# Patient Record
Sex: Female | Born: 1970
Health system: Southern US, Community
[De-identification: ages and names within clinical notes are randomized; demographics above are authoritative.]

## PROBLEM LIST (undated history)

## (undated) DIAGNOSIS — E119 Type 2 diabetes mellitus without complications: Secondary | ICD-10-CM

## (undated) DIAGNOSIS — I739 Peripheral vascular disease, unspecified: Secondary | ICD-10-CM

## (undated) DIAGNOSIS — I679 Cerebrovascular disease, unspecified: Secondary | ICD-10-CM

## (undated) DIAGNOSIS — I779 Disorder of arteries and arterioles, unspecified: Secondary | ICD-10-CM

## (undated) DIAGNOSIS — I5189 Other ill-defined heart diseases: Secondary | ICD-10-CM

## (undated) DIAGNOSIS — F32A Depression, unspecified: Secondary | ICD-10-CM

## (undated) DIAGNOSIS — F4310 Post-traumatic stress disorder, unspecified: Secondary | ICD-10-CM

## (undated) DIAGNOSIS — I10 Essential (primary) hypertension: Secondary | ICD-10-CM

## (undated) DIAGNOSIS — E785 Hyperlipidemia, unspecified: Secondary | ICD-10-CM

## (undated) DIAGNOSIS — I2510 Atherosclerotic heart disease of native coronary artery without angina pectoris: Secondary | ICD-10-CM

## (undated) DIAGNOSIS — Z720 Tobacco use: Secondary | ICD-10-CM

## (undated) DIAGNOSIS — M26629 Arthralgia of temporomandibular joint, unspecified side: Secondary | ICD-10-CM

## (undated) DIAGNOSIS — D689 Coagulation defect, unspecified: Secondary | ICD-10-CM

## (undated) DIAGNOSIS — E669 Obesity, unspecified: Secondary | ICD-10-CM

## (undated) DIAGNOSIS — F329 Major depressive disorder, single episode, unspecified: Secondary | ICD-10-CM

## (undated) DIAGNOSIS — R0989 Other specified symptoms and signs involving the circulatory and respiratory systems: Secondary | ICD-10-CM

## (undated) DIAGNOSIS — K760 Fatty (change of) liver, not elsewhere classified: Secondary | ICD-10-CM

## (undated) DIAGNOSIS — I639 Cerebral infarction, unspecified: Secondary | ICD-10-CM

## (undated) DIAGNOSIS — Z9289 Personal history of other medical treatment: Secondary | ICD-10-CM

## (undated) HISTORY — DX: Major depressive disorder, single episode, unspecified: F32.9

## (undated) HISTORY — DX: Post-traumatic stress disorder, unspecified: F43.10

## (undated) HISTORY — DX: Arthralgia of temporomandibular joint, unspecified side: M26.629

## (undated) HISTORY — DX: Coagulation defect, unspecified: D68.9

## (undated) HISTORY — DX: Depression, unspecified: F32.A

## (undated) HISTORY — DX: Fatty (change of) liver, not elsewhere classified: K76.0

## (undated) HISTORY — PX: CHOLECYSTECTOMY: SHX55

## (undated) HISTORY — DX: Cerebral infarction, unspecified: I63.9

## (undated) HISTORY — DX: Other specified symptoms and signs involving the circulatory and respiratory systems: R09.89

## (undated) HISTORY — DX: Cerebrovascular disease, unspecified: I67.9

## (undated) HISTORY — PX: TONSILLECTOMY: SUR1361

## (undated) HISTORY — DX: Type 2 diabetes mellitus without complications: E11.9

---

## 2004-07-04 ENCOUNTER — Emergency Department (HOSPITAL_COMMUNITY): Admission: EM | Admit: 2004-07-04 | Discharge: 2004-07-05 | Payer: Self-pay | Admitting: Emergency Medicine

## 2009-05-02 ENCOUNTER — Encounter: Payer: Self-pay | Admitting: Family Medicine

## 2009-05-02 ENCOUNTER — Ambulatory Visit (HOSPITAL_COMMUNITY): Admission: RE | Admit: 2009-05-02 | Discharge: 2009-05-02 | Payer: Self-pay | Admitting: Family Medicine

## 2009-05-28 ENCOUNTER — Emergency Department (HOSPITAL_COMMUNITY): Admission: EM | Admit: 2009-05-28 | Discharge: 2009-05-29 | Payer: Self-pay | Admitting: Emergency Medicine

## 2009-11-14 ENCOUNTER — Ambulatory Visit: Payer: Self-pay | Admitting: Diagnostic Radiology

## 2009-11-14 ENCOUNTER — Emergency Department (HOSPITAL_BASED_OUTPATIENT_CLINIC_OR_DEPARTMENT_OTHER): Admission: EM | Admit: 2009-11-14 | Discharge: 2009-11-14 | Payer: Self-pay | Admitting: Emergency Medicine

## 2010-07-26 ENCOUNTER — Ambulatory Visit: Payer: Self-pay | Admitting: Family Medicine

## 2010-07-26 DIAGNOSIS — I10 Essential (primary) hypertension: Secondary | ICD-10-CM | POA: Insufficient documentation

## 2010-07-26 DIAGNOSIS — F4323 Adjustment disorder with mixed anxiety and depressed mood: Secondary | ICD-10-CM | POA: Insufficient documentation

## 2010-07-26 DIAGNOSIS — E785 Hyperlipidemia, unspecified: Secondary | ICD-10-CM | POA: Insufficient documentation

## 2010-07-27 ENCOUNTER — Encounter: Payer: Self-pay | Admitting: Family Medicine

## 2010-07-27 LAB — CONVERTED CEMR LAB
BUN: 9 mg/dL (ref 6–23)
Cholesterol: 248 mg/dL — ABNORMAL HIGH (ref 0–200)
Creatinine, Ser: 0.7 mg/dL (ref 0.4–1.2)
Direct LDL: 176.1 mg/dL
GFR calc non Af Amer: 109.22 mL/min (ref >60)
Potassium: 4.5 meq/L (ref 3.5–5.1)
Triglycerides: 369 mg/dL — ABNORMAL HIGH (ref 0.0–149.0)

## 2010-09-30 ENCOUNTER — Ambulatory Visit: Payer: Self-pay | Admitting: Internal Medicine

## 2010-09-30 DIAGNOSIS — M26629 Arthralgia of temporomandibular joint, unspecified side: Secondary | ICD-10-CM | POA: Insufficient documentation

## 2010-09-30 DIAGNOSIS — H6500 Acute serous otitis media, unspecified ear: Secondary | ICD-10-CM | POA: Insufficient documentation

## 2011-01-14 ENCOUNTER — Encounter: Payer: Self-pay | Admitting: Family Medicine

## 2011-01-15 ENCOUNTER — Encounter: Payer: Self-pay | Admitting: Family Medicine

## 2011-01-23 NOTE — Letter (Signed)
Summary: Out of Work  [facility]  [address] [place], [place] [address]   Phone: [phone]  Fax: [phone]    July 26, 2010   Employee:  DAJIAH KOOI    To Whom It May Concern:   For Medical reasons, please excuse the above named employee from work for the following dates:  Start:   07/27/2010  End:   08/03/2010  If you need additional information, please feel free to contact our office.         Sincerely,    Ruthe Mannan MD

## 2011-01-23 NOTE — Letter (Signed)
Summary: Behavioral Health Clinician Statement/[employer]  Behavioral Health Clinician Statement/[employer]   Imported By: Maryln Gottron 08/04/2010 12:37:02  _____________________________________________________________________  External Attachment:    Type:   Image     Comment:   External Document

## 2011-01-23 NOTE — Assessment & Plan Note (Signed)
Summary: ? EARACHE/JBB   Vital Signs:  Patient Profile:   40 Years Old Female CC:      left jaw pain radiating to left ear Height:     67 inches Weight:      230 pounds Temp:     98.1 degrees F oral Pulse rate:   84 / minute Pulse rhythm:   regular Resp:     20 per minute BP sitting:   158 / 103  (right arm) Cuff size:   large  Pt. in pain?   no  Vitals Entered By: Providence Crosby LPN (September 30, 2010 2:18 PM)                   Updated Prior Medication List: HYDROCHLOROTHIAZIDE 25 MG TABS (HYDROCHLOROTHIAZIDE) take one tablet by mouth daily ENALAPRIL MALEATE 20 MG TABS (ENALAPRIL MALEATE) take one tablet by mouth daily WELLBUTRIN SR 150 MG XR12H-TAB (BUPROPION HCL) 1 tab by mouth every morning. SIMVASTATIN 10 MG TABS (SIMVASTATIN) 1 by mouth at bedtime ALEVE 220 MG TABS (NAPROXEN SODIUM) prn  Current Allergies (reviewed today): No known allergies History of Present Illness Reason for visit: left jaw pain radiating to ear Chief Complaint: left jaw pain radiating to left ear History of Present Illness: started 2 days ago cannot tell if it is her ear are tooth uses left ear at work with head phone. Sharp, near tmj, worse with vigorous chewing. History of similar bilat. Also can't pop ear on left.  Current Problems: TMJ PAIN (ICD-524.62) ACUTE SEROUS OTITIS MEDIA (ICD-381.01) ADJUSTMENT DISORDER WITH MIXED FEATURES (ICD-309.28) HYPERLIPIDEMIA (ICD-272.4) HYPERTENSION (ICD-401.9) ESSENTIAL HYPERTENSION, BENIGN (ICD-401.1)   Current Meds HYDROCHLOROTHIAZIDE 25 MG TABS (HYDROCHLOROTHIAZIDE) take one tablet by mouth daily ENALAPRIL MALEATE 20 MG TABS (ENALAPRIL MALEATE) take one tablet by mouth daily WELLBUTRIN SR 150 MG XR12H-TAB (BUPROPION HCL) 1 tab by mouth every morning. SIMVASTATIN 10 MG TABS (SIMVASTATIN) 1 by mouth at bedtime ALEVE 220 MG TABS (NAPROXEN SODIUM) prn  REVIEW OF SYSTEMS Constitutional Symptoms      Denies fever, chills, night sweats, weight loss,  weight gain, and fatigue.  Eyes       Denies change in vision, eye pain, eye discharge, glasses, contact lenses, and eye surgery. Ear/Nose/Throat/Mouth       Complains of ear pain and tooth pain or bleeding.      Denies hearing loss/aids, change in hearing, ear discharge, dizziness, frequent runny nose, frequent nose bleeds, sinus problems, sore throat, and hoarseness.      Comments: left ear// and tooth Respiratory       Denies dry cough, productive cough, wheezing, shortness of breath, asthma, bronchitis, and emphysema/COPD.  Cardiovascular       Denies murmurs, chest pain, and tires easily with exhertion.    Gastrointestinal       Denies stomach pain, nausea/vomiting, diarrhea, constipation, blood in bowel movements, and indigestion. Genitourniary       Denies painful urination, kidney stones, and loss of urinary control. Neurological       Denies paralysis, seizures, and fainting/blackouts. Musculoskeletal       Denies muscle pain, joint pain, joint stiffness, decreased range of motion, redness, swelling, muscle weakness, and gout.  Skin       Denies bruising, unusual mles/lumps or sores, and hair/skin or nail changes.  Psych       Denies mood changes, temper/anger issues, anxiety/stress, speech problems, depression, and sleep problems.  Past History:  Past Medical History: Last updated: 07/26/2010 Hypertension Hyperlipidemia  Past Surgical History: Last updated: 07/26/2010 Cholecystectomy Tonsillectomy Caesarean section  Social History: Last updated: 07/26/2010 Married Current Smoker Alcohol use-no Drug use-no Physical Exam General appearance: well developed, well nourished, no acute distress Head: normocephalic, atraumatic Eyes: conjunctivae and lids normal Pupils: equal, round, reactive to light Ears: fluid noted without inflammaton left TM Nasal: mucosa pink, nonedematous, no septal deviation, turbinates normal Oral/Pharynx: tongue normal, posterior pharynx  without erythema or exudate Neck: neck supple,  trachea midline, no masses Thyroid: no nodules, masses, tenderness, or enlargement Neurological: grossly intact and non-focal Skin: no obvious rashes or lesions MSE: oriented to time, place, and person teeth not tender but cheek near tmj tender Assessment New Problems: TMJ PAIN (ICD-524.62) ACUTE SEROUS OTITIS MEDIA (ICD-381.01)   Patient Education: Patient and/or caregiver instructed in the following: Tylenol prn, quit smoking. aleve 1-2 two times a day pc as needed coricidin for hypertensives as needed oxymetalazone nasal spray two times a day for 3 days max recheck bp daily and follow up with PCP if remains elevated  Plan Follow Up: Follow up in 1-2 days if no improvement    Patient Instructions: 1)  declined specific diagnosis sheets. 2)  The patient was instructed to seek medical follow-up for worsening or new symptoms and if needed outside of our regular hours, seek attention at an Emergency Dept. It was also explained that the [facility] is not intended as a primary care facility and that the patient is always better served by the continuity of care of the ongoing relationship with a dedicated primary care MD. The patient should inform their primary MD of all conditions and treatments provided here.

## 2011-01-23 NOTE — Letter (Signed)
Summary: Records from [facility]  2010  Records from [facility]  2010   Imported By: Maryln Gottron 08/08/2010 10:57:27  _____________________________________________________________________  External Attachment:    Type:   Image     Comment:   External Document

## 2011-01-23 NOTE — Assessment & Plan Note (Signed)
Summary: NEW PT TO EST/CLE   Vital Signs:  Patient profile:   40 year old female Height:      67 inches Weight:      225.38 pounds BMI:     35.43 Temp:     98.8 degrees F oral Pulse rate:   72 / minute Pulse rhythm:   regular BP sitting:   130 / 90  (right arm) Cuff size:   large  Vitals Entered By: Linde Gillis CMA Duncan Dull) (July 26, 2010 10:10 AM) CC: new patient   History of Present Illness: 40 yo here to establish care.  1.  Adjustment disorder- anxiety and depression.  Husband was injured at work, daughter is in kidney failure and mom died two years ago.  Tearful at work, difficultly concentrating.  Difficulty getting out of bed in the morning, lack of interest in things she used to do. Never been treated for anxiety or depression in past.   Seeing a counsellor for the first time today, referred by a friend- [facility] No SI or HI. Feels she needs to take FMLA so she does not get fired.  2.  HTN- has been on HCTZ 25 mg/Enalapril 20 mg daily for years.  No CP, SOB, blurred vision or LE edema.  3.  HLD- was told last year that she has HLD, not on medication.  Well woman- due for pap, FLP, BMET, tetanus.  Preventive Screening-Counseling & Management  Alcohol-Tobacco     Smoking Status: current      Drug Use:  no.    Current Medications (verified): 1)  Hydrochlorothiazide 25 Mg Tabs (Hydrochlorothiazide) .... Take One Tablet By Mouth Daily 2)  Enalapril Maleate 20 Mg Tabs (Enalapril Maleate) .... Take One Tablet By Mouth Daily 3)  Wellbutrin Sr 150 Mg Xr12h-Tab (Bupropion Hcl) .Marland Kitchen.. 1 Tab By Mouth Every Morning.  Allergies (verified): No Known Drug Allergies  Past History:  Family History: Last updated: 07/26/2010 Mom - DM Dad- DM, alcoholism  Social History: Last updated: 07/26/2010 Married Current Smoker Alcohol use-no Drug use-no  Risk Factors: Smoking Status: current (07/26/2010)  Past Medical  History: Hypertension Hyperlipidemia  Past Surgical History: Cholecystectomy Tonsillectomy Caesarean section  Family History: Mom - DM Dad- DM, alcoholism  Social History: Married Current Smoker Alcohol use-no Drug use-no Smoking Status:  current Drug Use:  no  Review of Systems      See HPI General:  Denies malaise. Eyes:  Denies blurring. ENT:  Denies difficulty swallowing. CV:  Denies chest pain or discomfort and shortness of breath with exertion. Resp:  Denies shortness of breath. GI:  Denies abdominal pain, bloody stools, and change in bowel habits. GU:  Denies abnormal vaginal bleeding, discharge, and dysuria. MS:  Denies joint pain, joint redness, and joint swelling. Derm:  Denies rash. Neuro:  Denies headaches. Psych:  Complains of anxiety, depression, easily tearful, and irritability; denies easily angered, mental problems, panic attacks, sense of great danger, suicidal thoughts/plans, thoughts of violence, unusual visions or sounds, and thoughts /plans of harming others. Endo:  Denies cold intolerance and heat intolerance. Heme:  Denies abnormal bruising and bleeding.  Physical Exam  General:  alert, well-developed, and overweight-appearing.   Head:  normocephalic and atraumatic.   Eyes:  vision grossly intact, pupils equal, and pupils round.   Ears:  R ear normal and L ear normal.   Nose:  no external deformity.   Mouth:  good dentition.   Lungs:  Normal respiratory effort, chest expands symmetrically. Lungs  are clear to auscultation, no crackles or wheezes. Heart:  Normal rate and regular rhythm. S1 and S2 normal without gallop, murmur, click, rub or other extra sounds. Abdomen:  Bowel sounds positive,abdomen soft and non-tender without masses, organomegaly or hernias noted. Msk:  normal ROM, no joint tenderness, no joint swelling, and no joint warmth.   Extremities:  no edema Neurologic:  alert & oriented X3 and gait normal.   Skin:  Intact without  suspicious lesions or rashes Psych:  Cognition and judgment appear intact. Alert and cooperative with normal attention span and concentration. No apparent delusions, illusions, hallucinations   Impression & Recommendations:  Problem # 1:  ADJUSTMENT DISORDER WITH MIXED FEATURES (ICD-309.28) Assessment New Time spent with patient 45 minutes, more than 50% of this time was spent counseling patient on axiety/depression. Continue therapy, will start Wellbutrin 150 mg daily. Pt to follow up in one month. Given note to excuse her from work this week and agreed to fill out FMLA paperwork for her for 8 week duration (until antidepressants can be adjusted correctly).  Pt in agreement with plan.  Problem # 2:  HYPERTENSION (ICD-401.9) Assessment: Unchanged stable with current meds. Check BMET. Her updated medication list for this problem includes:    Hydrochlorothiazide 25 Mg Tabs (Hydrochlorothiazide) .Marland Kitchen... Take one tablet by mouth daily    Enalapril Maleate 20 Mg Tabs (Enalapril maleate) .Marland Kitchen... Take one tablet by mouth daily  Problem # 3:  HYPERLIPIDEMIA (ICD-272.4) Assessment: Unchanged recheck FLP today.  Complete Medication List: 1)  Hydrochlorothiazide 25 Mg Tabs (Hydrochlorothiazide) .... Take one tablet by mouth daily 2)  Enalapril Maleate 20 Mg Tabs (Enalapril maleate) .... Take one tablet by mouth daily 3)  Wellbutrin Sr 150 Mg Xr12h-tab (Bupropion hcl) .Marland Kitchen.. 1 tab by mouth every morning.  Other Orders: TLB-Lipid Panel (80061-LIPID) Venipuncture (04540) TLB-BMP (Basic Metabolic Panel-BMET) (80048-METABOL)  Patient Instructions: 1)  Great to meet you and [name]. 2)  Please make an appointment for a complete physical/pap in one month. Prescriptions: WELLBUTRIN SR 150 MG XR12H-TAB (BUPROPION HCL) 1 tab by mouth every morning.  #60 x 0   Entered and Authorized by:   Ruthe Mannan MD   Signed by:   Ruthe Mannan MD on 07/26/2010   Method used:   Electronically to        [id number]  [address] (retail)       [address], [address]       [place], [place]  [address]       Ph: [phone]       Fax: [phone]   RxID:   [id number] ENALAPRIL MALEATE 20 MG TABS (ENALAPRIL MALEATE) take one tablet by mouth daily  #90 x 3   Entered and Authorized by:   Ruthe Mannan MD   Signed by:   Ruthe Mannan MD on 07/26/2010   Method used:   Electronically to        [facility] [address] (retail)       [address], [address]       [place], [place]  [address]       Ph: [phone]       Fax: [phone]   RxID:   [id number] HYDROCHLOROTHIAZIDE 25 MG TABS (HYDROCHLOROTHIAZIDE) take one tablet by mouth daily  #90 x 3   Entered and Authorized by:   Ruthe Mannan MD   Signed by:  Ruthe Mannan MD on 07/26/2010   Method used:   Electronically to        [facility] [address] (retail)       [address], [address]       [place], [place]  [address]       Ph: [phone]       Fax: [phone]   RxID:   [id number]   Current Allergies (reviewed today): No known allergies  Prevention & Chronic Care Immunizations   Influenza vaccine: Not documented    Tetanus booster: 07/26/2010: given   Tetanus booster due: 07/26/2020    Pneumococcal vaccine: Not documented  Other Screening   Pap smear: Not documented   Smoking status: current  (07/26/2010)  Lipids   Total Cholesterol: Not documented   Lipid panel action/deferral: Lipid Panel ordered   LDL: Not documented   LDL Direct: Not documented   HDL: Not documented   Triglycerides: Not documented    SGOT (AST): Not documented   SGPT (ALT): Not documented   Alkaline phosphatase: Not documented   Total bilirubin: Not documented  Hypertension   Last Blood Pressure: 130 / 90  (07/26/2010)   Serum creatinine: Not documented   Serum potassium Not documented  Self-Management Support :    Hypertension self-management support:  Not documented    Lipid self-management support: Not documented    Nursing Instructions: Give tetanus booster today   TD Result Date:  07/26/2010 TD Result:  given

## 2011-01-24 ENCOUNTER — Ambulatory Visit: Payer: Self-pay

## 2011-01-24 DIAGNOSIS — J069 Acute upper respiratory infection, unspecified: Secondary | ICD-10-CM

## 2011-01-24 DIAGNOSIS — I10 Essential (primary) hypertension: Secondary | ICD-10-CM

## 2011-02-01 ENCOUNTER — Encounter: Payer: Self-pay | Admitting: Internal Medicine

## 2011-02-08 NOTE — Progress Notes (Signed)
Summary: office visit 01/24/11  office visit 01/24/11   Imported By: Erskine Squibb Breitmeier 02/01/2011 10:57:41  _____________________________________________________________________  External Attachment:    Type:   Image     Comment:   External Document

## 2011-02-08 NOTE — Progress Notes (Signed)
Summary: office visit-01/24/11  office visit-01/24/11   Imported By: Rosine Beat 02/01/2011 10:56:50  _____________________________________________________________________  External Attachment:    Type:   Image     Comment:   External Document

## 2011-03-28 LAB — POCT CARDIAC MARKERS
Myoglobin, poc: 58.7 ng/mL (ref 12–200)
Troponin i, poc: <0.05 ng/mL (ref 0.00–0.09)

## 2011-03-28 LAB — DIFFERENTIAL
Basophils Absolute: 0.1 10*3/uL (ref 0.0–0.1)
Basophils Relative: 1 % (ref 0–1)
Lymphs Abs: 1.3 10*3/uL (ref 0.7–4.0)
Monocytes Absolute: 0.3 10*3/uL (ref 0.1–1.0)
Neutrophils Relative %: 81 % — ABNORMAL HIGH (ref 43–77)

## 2011-03-28 LAB — BASIC METABOLIC PANEL
CO2: 28 mEq/L (ref 19–32)
Creatinine, Ser: 0.7 mg/dL (ref 0.4–1.2)
Glucose, Bld: 100 mg/dL — ABNORMAL HIGH (ref 70–99)
Potassium: 4.1 mEq/L (ref 3.5–5.1)

## 2011-03-28 LAB — CBC
HCT: 41.8 % (ref 36.0–46.0)
Hemoglobin: 14.2 g/dL (ref 12.0–15.0)
MCHC: 34 g/dL (ref 30.0–36.0)
Platelets: 367 10*3/uL (ref 150–400)
WBC: 10 10*3/uL (ref 4.0–10.5)

## 2011-04-02 LAB — COMPREHENSIVE METABOLIC PANEL
ALT: 62 U/L — ABNORMAL HIGH (ref 0–35)
AST: 42 U/L — ABNORMAL HIGH (ref 0–37)
Albumin: 4.3 g/dL (ref 3.5–5.2)
Alkaline Phosphatase: 66 U/L (ref 39–117)
CO2: 30 mEq/L (ref 19–32)
Calcium: 10.5 mg/dL (ref 8.4–10.5)
Creatinine, Ser: 1.82 mg/dL — ABNORMAL HIGH (ref 0.4–1.2)
Total Protein: 7.9 g/dL (ref 6.0–8.3)

## 2011-04-02 LAB — CBC
HCT: 45.4 % (ref 36.0–46.0)
MCHC: 35 g/dL (ref 30.0–36.0)
MCHC: 35.2 g/dL (ref 30.0–36.0)
MCV: 88.3 fL (ref 78.0–100.0)
MCV: 89.3 fL (ref 78.0–100.0)
Platelets: 389 10*3/uL (ref 150–400)
Platelets: 402 10*3/uL — ABNORMAL HIGH (ref 150–400)
RBC: 5.14 MIL/uL — ABNORMAL HIGH (ref 3.87–5.11)
RBC: 5.19 MIL/uL — ABNORMAL HIGH (ref 3.87–5.11)

## 2011-04-02 LAB — URINE CULTURE: Colony Count: 50000

## 2011-04-02 LAB — PREGNANCY, URINE: Preg Test, Ur: NEGATIVE

## 2011-04-02 LAB — DIFFERENTIAL
Basophils Absolute: 0 10*3/uL (ref 0.0–0.1)
Basophils Relative: 0 % (ref 0–1)
Eosinophils Absolute: 0.1 10*3/uL (ref 0.0–0.7)
Eosinophils Relative: 1 % (ref 0–5)
Monocytes Relative: 2 % — ABNORMAL LOW (ref 3–12)
Neutro Abs: 12.8 10*3/uL — ABNORMAL HIGH (ref 1.7–7.7)
Neutro Abs: 15.7 10*3/uL — ABNORMAL HIGH (ref 1.7–7.7)
Neutrophils Relative %: 81 % — ABNORMAL HIGH (ref 43–77)
Neutrophils Relative %: 87 % — ABNORMAL HIGH (ref 43–77)

## 2011-04-02 LAB — URINALYSIS, ROUTINE W REFLEX MICROSCOPIC
Glucose, UA: NEGATIVE mg/dL
Hgb urine dipstick: NEGATIVE
Urobilinogen, UA: 0.2 mg/dL (ref 0.0–1.0)

## 2011-04-02 LAB — POCT I-STAT, CHEM 8
Calcium, Ion: 1.19 mmol/L (ref 1.12–1.32)
Chloride: 102 mEq/L (ref 96–112)
Creatinine, Ser: 1.4 mg/dL — ABNORMAL HIGH (ref 0.4–1.2)
Hemoglobin: 16.7 g/dL — ABNORMAL HIGH (ref 12.0–15.0)
Potassium: 3.9 mEq/L (ref 3.5–5.1)
TCO2: 24 mmol/L (ref 0–100)

## 2011-11-27 ENCOUNTER — Other Ambulatory Visit: Payer: Self-pay | Admitting: Family Medicine

## 2011-11-27 MED ORDER — VARENICLINE TARTRATE 0.5 MG X 11 & 1 MG X 42 PO MISC: ORAL | Status: AC

## 2012-03-17 ENCOUNTER — Emergency Department: Payer: Self-pay | Admitting: Emergency Medicine

## 2012-03-17 ENCOUNTER — Telehealth: Payer: Self-pay | Admitting: Family Medicine

## 2012-03-17 LAB — COMPREHENSIVE METABOLIC PANEL
Alkaline Phosphatase: 79 U/L (ref 50–136)
BUN: 11 mg/dL (ref 7–18)
Bilirubin,Total: 0.3 mg/dL (ref 0.2–1.0)
Calcium, Total: 9.9 mg/dL (ref 8.5–10.1)
Chloride: 100 mmol/L (ref 98–107)
EGFR (Non-African Amer.): >60
Glucose: 114 mg/dL — ABNORMAL HIGH (ref 65–99)
Osmolality: 278 (ref 275–301)
Potassium: 3.7 mmol/L (ref 3.5–5.1)
SGOT(AST): 77 U/L — ABNORMAL HIGH (ref 15–37)
Sodium: 139 mmol/L (ref 136–145)
Total Protein: 8.5 g/dL — ABNORMAL HIGH (ref 6.4–8.2)

## 2012-03-17 LAB — URINALYSIS, COMPLETE
Bacteria: NONE SEEN
Glucose,UR: NEGATIVE mg/dL (ref 0–75)
Leukocyte Esterase: NEGATIVE
Nitrite: NEGATIVE
Ph: 5 (ref 4.5–8.0)
Protein: NEGATIVE
RBC,UR: <1 /HPF (ref 0–5)
Specific Gravity: 1.012 (ref 1.003–1.030)
WBC UR: 1 /HPF (ref 0–5)

## 2012-03-17 LAB — CBC
HGB: 16 g/dL (ref 12.0–16.0)
MCH: 30.2 pg (ref 26.0–34.0)
Platelet: 410 10*3/uL (ref 150–440)
RDW: 13.4 % (ref 11.5–14.5)

## 2012-03-17 LAB — PREGNANCY, URINE: Pregnancy Test, Urine: NEGATIVE m[IU]/mL

## 2012-03-17 NOTE — Telephone Encounter (Signed)
[name]/Pt  refused 911 with CSR Pt/Erika [name] calling regarding rt sided pain behind eye, blurriness, and numbness on rt side of body for the last 2 days. PT's boss making her call. Emergent sxs Neurological Deficits: New numbness, weakness or paralysis involving face, arm or leg, especially on same side of body, occuring now or w/in the last 2 hrs. Advised pt to call 911. Pt to do so now.

## 2012-03-17 NOTE — Telephone Encounter (Signed)
[facility] Triage Call Report Triage Record Num: [id number] Operator: Baldomero Lamy Patient Name: Erika [name] Call Date & Time: 03/17/2012 2:59:31PM Patient Phone: [phone] PCP: Ruthe Mannan Patient Gender: Female PCP Fax : [phone] Patient DOB: 1971-03-22 Practice Name: [facility] Reason for Call: Tesha/Pt refused 911 with CSR Pt/Wendi calling regarding rt sided pain behind eye, blurriness, and numbness over the last 2 days. PT's boss making her cal. Emergent sxs Neurological Deficits: New numbness, weakness or paralysis involving face, arm or leg, especially on same side of body, occuring now or w/in the last 2 hrs. Advised pt to call 911. Pt to do so now. Protocol(s) Used: Neurological Deficits Recommended Outcome per Protocol: Activate EMS 911 Reason for Outcome: New numbness, weakness or paralysis involving face, arm or leg, especially on same side of body, occurring now or within last 2 hours Care Advice: ~ Protect the patient from falling or other harm. ~ Do not give the patient anything to eat or drink. ~ An adult should stay with the patient, preferably one trained in CPR. ~ IMMEDIATE ACTION Write down provider's name. List or place the following in a bag for transport with the patient: current prescription and/or nonprescription medications; alternative treatments, therapies and medications; and street drugs. ~ 03/17/2012 3:18:57PM Page 1 of 1 [email or web address]

## 2012-03-18 ENCOUNTER — Ambulatory Visit: Payer: Self-pay | Admitting: Family Medicine

## 2012-03-19 ENCOUNTER — Encounter: Payer: Self-pay | Admitting: Family Medicine

## 2012-03-20 ENCOUNTER — Ambulatory Visit (INDEPENDENT_AMBULATORY_CARE_PROVIDER_SITE_OTHER): Payer: Private Health Insurance - Indemnity | Admitting: Family Medicine

## 2012-03-20 ENCOUNTER — Encounter: Payer: Self-pay | Admitting: Family Medicine

## 2012-03-20 VITALS — BP 170/100 | HR 68 | Temp 98.0°F | Wt 245.0 lb

## 2012-03-20 DIAGNOSIS — G44009 Cluster headache syndrome, unspecified, not intractable: Secondary | ICD-10-CM | POA: Insufficient documentation

## 2012-03-20 DIAGNOSIS — I10 Essential (primary) hypertension: Secondary | ICD-10-CM

## 2012-03-20 MED ORDER — ALBUTEROL SULFATE HFA 108 (90 BASE) MCG/ACT IN AERS: 2.0000 | INHALATION_SPRAY | Freq: Four times a day (QID) | RESPIRATORY_TRACT | Status: DC | PRN

## 2012-03-20 MED ORDER — VERAPAMIL HCL ER 240 MG PO TBCR: 240.0000 mg | EXTENDED_RELEASE_TABLET | Freq: Every day | ORAL | Status: DC

## 2012-03-20 MED ORDER — HYDROCHLOROTHIAZIDE 12.5 MG PO TABS: 12.5000 mg | ORAL_TABLET | Freq: Every day | ORAL | Status: DC

## 2012-03-20 NOTE — Progress Notes (Signed)
Subjective:    Patient ID: Erika [name], female    DOB: [birth year], 41 y.o.   MRN: [id number]  HPI  41 yo here for ER follow up.  Notes reviewed. Presented to [facility] on 03/17/2012 for headache x 3 days.   HA intermittent, associated with left temple pain and tearing of eye. Sometimes associated with numbness in finger tips.  Felt it was a cluster headache, given Os, IM toradol, IM reglan and headache resolved.  Head CT negative.  HTN has been poorly controlled on Enalapril 20 mg and HCTZ 25 mg daily.  Having these intermittent headaches -8 to 10 per day but none as severe as when she went to ER.  Patient Active Problem List  Diagnoses  . HYPERLIPIDEMIA  . ADJUSTMENT DISORDER WITH MIXED FEATURES  . ACUTE SEROUS OTITIS MEDIA  . ESSENTIAL HYPERTENSION, BENIGN  . HYPERTENSION  . TMJ PAIN  . Cluster headache   Past Medical History  Diagnosis Date  . Acute serous otitis media   . Adjustment disorder with mixed anxiety and depressed mood   . Essential hypertension, benign   . Other and unspecified hyperlipidemia   . Unspecified essential hypertension   . Arthralgia of temporomandibular joint    Past Surgical History  Procedure Date  . Cholecystectomy   . Tonsillectomy   . Cesarean section    History  Substance Use Topics  . Smoking status: Current Everyday Smoker  . Smokeless tobacco: Not on file  . Alcohol Use: No   Family History  Problem Relation Age of Onset  . Diabetes Mother   . Diabetes Father   . Alcohol abuse Father    No Known Allergies Current Outpatient Prescriptions on File Prior to Visit  Medication Sig Dispense Refill  . naproxen sodium (ANAPROX) 220 MG tablet Take by mouth as needed      . simvastatin (ZOCOR) 10 MG tablet Take 10 mg by mouth at bedtime.       The PMH, PSH, Social History, Family History, Medications, and allergies have been reviewed in [facility], and have been updated if relevant.   Review of Systems No blurred vision. No  CP, No SOB. No UE or LE weakness.    Objective:   Physical Exam  BP 170/100  Pulse 68  Temp(Src) 98 F (36.7 C) (Oral)  Wt 245 lb (111.131 kg)  General:  Well-developed,well-nourished,in no acute distress; alert,appropriate and cooperative throughout examination Head:  normocephalic and atraumatic.   Eyes:  vision grossly intact, pupils equal, pupils round, and pupils reactive to light.   Ears:  R ear normal and L ear normal.   Nose:  no external deformity.   Lungs:  Normal respiratory effort, chest expands symmetrically. Lungs are clear to auscultation, no crackles or wheezes. Heart:  Normal rate and regular rhythm. S1 and S2 normal without gallop, murmur, click, rub or other extra sounds. Abdomen:  Bowel sounds positive,abdomen soft and non-tender without masses, organomegaly or hernias noted. Msk:  No deformity or scoliosis noted of thoracic or lumbar spine.   Extremities:  No clubbing, cyanosis, edema, or deformity noted with normal full range of motion of all joints.   Neurologic:  alert & oriented X3 and gait normal.   Skin:  Intact without suspicious lesions or rashes Psych:  Cognition and judgment appear intact. Alert and cooperative with normal attention span and concentration. No apparent delusions, illusions, hallucinations       Assessment & Plan:   1. Cluster headache  New- discussed cluster  headaches with pt and her husband.  Will start preventative therapy today- Verapamil 240 mg daily.  Pt to follow up in 2 weeks.  2. HYPERTENSION  Deteriorated. Starting verapamil 240 mg for Cluster headaches, d/c lisinopril to prevent hypotension. Continue HCTZ but at lower dose- 12.5 mg daily. Follow up in 2 weeks- may need to titrate up HCTZ. The patient indicates understanding of these issues and agrees with the plan.

## 2012-03-20 NOTE — Patient Instructions (Signed)
Stop the enalapril. Start Verapamil and continue HCTZ 12.5 mg daily. Follow up with me in 2 weeks.

## 2012-04-01 ENCOUNTER — Telehealth: Payer: Self-pay | Admitting: *Deleted

## 2012-04-01 NOTE — Telephone Encounter (Signed)
Pt has dropped off FMLA paperwork, this is on your desk. 

## 2012-04-02 NOTE — Telephone Encounter (Signed)
Completed, in my box

## 2012-04-03 ENCOUNTER — Ambulatory Visit (INDEPENDENT_AMBULATORY_CARE_PROVIDER_SITE_OTHER): Payer: Private Health Insurance - Indemnity | Admitting: Family Medicine

## 2012-04-03 ENCOUNTER — Encounter: Payer: Self-pay | Admitting: Family Medicine

## 2012-04-03 VITALS — BP 180/100 | HR 76 | Temp 98.1°F | Wt 246.0 lb

## 2012-04-03 DIAGNOSIS — G44009 Cluster headache syndrome, unspecified, not intractable: Secondary | ICD-10-CM

## 2012-04-03 DIAGNOSIS — Q828 Other specified congenital malformations of skin: Secondary | ICD-10-CM | POA: Insufficient documentation

## 2012-04-03 DIAGNOSIS — I10 Essential (primary) hypertension: Secondary | ICD-10-CM

## 2012-04-03 MED ORDER — HYDROCHLOROTHIAZIDE 25 MG PO TABS: 12.5000 mg | ORAL_TABLET | Freq: Every day | ORAL | Status: DC

## 2012-04-03 NOTE — Patient Instructions (Signed)
Let's increase your HCTZ to 25 mg daily. Ok to double up on your 12.5 mg daily tablets--take them please. Buy a home blood pressure cuff when you can--OMRON is my favorite.

## 2012-04-03 NOTE — Progress Notes (Signed)
Subjective:    Patient ID: Erika [name], female    DOB: 1971/08/16, 41 y.o.   MRN: [id number]  HPI  41 yo here for 2 week follow up.  Presented to [facility] on 03/17/2012 for headache x 3 days.   HA intermittent, associated with left temple pain and tearing of eye. Sometimes associated with numbness in finger tips, consistent with cluster headache. Has only had two severe headaches since she was last here--much improved!  Started verapamil 240 mg for Cluster headaches, d/c'd lisinopril to prevent hypotension. Continue HCTZ but at lower dose- 12.5 mg daily two weeks ago. Bp remains very elevated but admits to not taking her blood pressure medication yesterday and just taking it 15 minutes before being seen today.    Having these intermittent headaches -8 to 10 per day but none as severe as when she went to ER.  Patient Active Problem List  Diagnoses  . HYPERLIPIDEMIA  . ADJUSTMENT DISORDER WITH MIXED FEATURES  . ACUTE SEROUS OTITIS MEDIA  . ESSENTIAL HYPERTENSION, BENIGN  . HYPERTENSION  . TMJ PAIN  . Cluster headache   Past Medical History  Diagnosis Date  . Acute serous otitis media   . Adjustment disorder with mixed anxiety and depressed mood   . Essential hypertension, benign   . Other and unspecified hyperlipidemia   . Unspecified essential hypertension   . Arthralgia of temporomandibular joint    Past Surgical History  Procedure Date  . Cholecystectomy   . Tonsillectomy   . Cesarean section    History  Substance Use Topics  . Smoking status: Current Everyday Smoker  . Smokeless tobacco: Not on file  . Alcohol Use: No   Family History  Problem Relation Age of Onset  . Diabetes Mother   . Diabetes Father   . Alcohol abuse Father    No Known Allergies Current Outpatient Prescriptions on File Prior to Visit  Medication Sig Dispense Refill  . albuterol (PROVENTIL HFA;VENTOLIN HFA) 108 (90 BASE) MCG/ACT inhaler Inhale 2 puffs into the lungs every 6 (six)  hours as needed for wheezing.  1 Inhaler  0  . hydrochlorothiazide (HYDRODIURIL) 12.5 MG tablet Take 1 tablet (12.5 mg total) by mouth daily.  90 tablet  3  . naproxen sodium (ANAPROX) 220 MG tablet Take by mouth as needed      . simvastatin (ZOCOR) 10 MG tablet Take 10 mg by mouth at bedtime.      . verapamil (CALAN-SR) 240 MG CR tablet Take 1 tablet (240 mg total) by mouth at bedtime.  90 tablet  3   The PMH, PSH, Social History, Family History, Medications, and allergies have been reviewed in [facility], and have been updated if relevant.   Review of Systems No blurred vision. No CP, No SOB. No UE or LE weakness.    Objective:   Physical Exam  There were no vitals taken for this visit.  General:  Well-developed,well-nourished,in no acute distress; alert,appropriate and cooperative throughout examination Head:  normocephalic and atraumatic.   Eyes:  vision grossly intact, pupils equal, pupils round, and pupils reactive to light.   Ears:  R ear normal and L ear normal.   Nose:  no external deformity.   Lungs:  Normal respiratory effort, chest expands symmetrically. Lungs are clear to auscultation, no crackles or wheezes. Heart:  Normal rate and regular rhythm. S1 and S2 normal without gallop, murmur, click, rub or other extra sounds. Abdomen:  Bowel sounds positive,abdomen soft and non-tender without masses, organomegaly  or hernias noted. Msk:  No deformity or scoliosis noted of thoracic or lumbar spine.   Extremities:  No clubbing, cyanosis, edema, or deformity noted with normal full range of motion of all joints.   Neurologic:  alert & oriented X3 and gait normal.   Skin: . Several benign skin tags are noted on the left neck.  Psych:  Cognition and judgment appear intact. Alert and cooperative with normal attention span and concentration. No apparent delusions, illusions, hallucinations     Assessment & Plan:   1. Cluster headache  Improved.  Continue current dose of Verapamil.  2.  Accessory skin tags  Skin tags are snipped off using Betadine for cleansing and sterile iris scissors. Local anesthesia was * used. These pathognomonic lesions are  sent for pathology.  3. HYPERTENSION  Remains poorly controlled.  Will increase HCTZ 25 mg daily.  Follow up in 2 weeks.

## 2012-04-03 NOTE — Telephone Encounter (Signed)
Original faxed to employer and then given to patient, copy saved for scanning.

## 2012-04-22 ENCOUNTER — Other Ambulatory Visit (HOSPITAL_COMMUNITY)
Admission: RE | Admit: 2012-04-22 | Discharge: 2012-04-22 | Disposition: A | Discharge status: Home / Self Care | Payer: Private Health Insurance - Indemnity | Source: Ambulatory Visit | Attending: Family Medicine | Admitting: Family Medicine

## 2012-04-22 ENCOUNTER — Encounter: Payer: Self-pay | Admitting: Family Medicine

## 2012-04-22 ENCOUNTER — Ambulatory Visit (INDEPENDENT_AMBULATORY_CARE_PROVIDER_SITE_OTHER): Payer: Private Health Insurance - Indemnity | Admitting: Family Medicine

## 2012-04-22 VITALS — BP 148/90 | HR 80 | Temp 98.3°F | Wt 242.0 lb

## 2012-04-22 DIAGNOSIS — G44009 Cluster headache syndrome, unspecified, not intractable: Secondary | ICD-10-CM

## 2012-04-22 DIAGNOSIS — I10 Essential (primary) hypertension: Secondary | ICD-10-CM

## 2012-04-22 DIAGNOSIS — E785 Hyperlipidemia, unspecified: Secondary | ICD-10-CM

## 2012-04-22 DIAGNOSIS — Z1159 Encounter for screening for other viral diseases: Secondary | ICD-10-CM | POA: Insufficient documentation

## 2012-04-22 DIAGNOSIS — Z0000 Encounter for general adult medical examination without abnormal findings: Secondary | ICD-10-CM

## 2012-04-22 DIAGNOSIS — Z01419 Encounter for gynecological examination (general) (routine) without abnormal findings: Secondary | ICD-10-CM | POA: Insufficient documentation

## 2012-04-22 LAB — COMPREHENSIVE METABOLIC PANEL
AST: 39 U/L — ABNORMAL HIGH (ref 0–37)
Alkaline Phosphatase: 58 U/L (ref 39–117)
BUN: 13 mg/dL (ref 6–23)
Glucose, Bld: 114 mg/dL — ABNORMAL HIGH (ref 70–99)
Potassium: 3.3 mEq/L — ABNORMAL LOW (ref 3.5–5.1)
Sodium: 140 mEq/L (ref 135–145)
Total Bilirubin: 0.4 mg/dL (ref 0.3–1.2)
Total Protein: 7.8 g/dL (ref 6.0–8.3)

## 2012-04-22 LAB — LDL CHOLESTEROL, DIRECT: Direct LDL: 177.1 mg/dL

## 2012-04-22 LAB — LIPID PANEL
Cholesterol: 252 mg/dL — ABNORMAL HIGH (ref 0–200)
Total CHOL/HDL Ratio: 9
Triglycerides: 395 mg/dL — ABNORMAL HIGH (ref 0.0–149.0)
VLDL: 79 mg/dL — ABNORMAL HIGH (ref 0.0–40.0)

## 2012-04-22 NOTE — Progress Notes (Signed)
Subjective:    Patient ID: Erika [name], female    DOB: [birth year], 41 y.o.   MRN: [id number]  HPI  41 yo here for CPX.  G1P1, no h/o abnormal pap smears. Adopted, but knows here birth mother- does not think she has a family history of breast cancer, ovarian CA or uterine cancer..  Cluster headaches- improved.  On verapamil 240 mg daily. Less severe when they do occur.  Also occuring less frequently.    HTN- On Verapamil (see above) and HCTZ 25 mg daily. BP elevated when she first arrived but took her medication 5 minutes before coming in. No HA, blurred vision or SOB. Feels like her BP has been better.   Patient Active Problem List  Diagnoses  . HYPERLIPIDEMIA  . ADJUSTMENT DISORDER WITH MIXED FEATURES  . ACUTE SEROUS OTITIS MEDIA  . ESSENTIAL HYPERTENSION, BENIGN  . HYPERTENSION  . TMJ PAIN  . Cluster headache  . Accessory skin tags   Past Medical History  Diagnosis Date  . Acute serous otitis media   . Adjustment disorder with mixed anxiety and depressed mood   . Essential hypertension, benign   . Other and unspecified hyperlipidemia   . Unspecified essential hypertension   . Arthralgia of temporomandibular joint    Past Surgical History  Procedure Date  . Cholecystectomy   . Tonsillectomy   . Cesarean section    History  Substance Use Topics  . Smoking status: Current Everyday Smoker  . Smokeless tobacco: Not on file  . Alcohol Use: No   Family History  Problem Relation Age of Onset  . Diabetes Mother   . Diabetes Father   . Alcohol abuse Father    No Known Allergies Current Outpatient Prescriptions on File Prior to Visit  Medication Sig Dispense Refill  . albuterol (PROVENTIL HFA;VENTOLIN HFA) 108 (90 BASE) MCG/ACT inhaler Inhale 2 puffs into the lungs every 6 (six) hours as needed for wheezing.  1 Inhaler  0  . hydrochlorothiazide (HYDRODIURIL) 25 MG tablet Take 0.5 tablets (12.5 mg total) by mouth daily.  90 tablet  3  . naproxen sodium  (ANAPROX) 220 MG tablet Take by mouth as needed      . simvastatin (ZOCOR) 10 MG tablet Take 10 mg by mouth at bedtime.      . verapamil (CALAN-SR) 240 MG CR tablet Take 1 tablet (240 mg total) by mouth at bedtime.  90 tablet  3   The PMH, PSH, Social History, Family History, Medications, and allergies have been reviewed in [facility], and have been updated if relevant.   Review of Systems No blurred vision. No CP, No SOB. No UE or LE weakness.    Objective:   Physical Exam  BP 160/92  Pulse 80  Temp(Src) 98.3 F (36.8 C) (Oral)  Wt 242 lb (109.77 kg)  LMP 04/12/2012 BP 148/90  Pulse 80  Temp(Src) 98.3 F (36.8 C) (Oral)  Wt 242 lb (109.77 kg)  LMP 04/12/2012  BP Readings from Last 3 Encounters:  04/22/12 160/92  04/03/12 180/100  03/20/12 170/100    General:  Well-developed,well-nourished,in no acute distress; alert,appropriate and cooperative throughout examination Head:  normocephalic and atraumatic.   Eyes:  vision grossly intact, pupils equal, pupils round, and pupils reactive to light.   Ears:  R ear normal and L ear normal.   Nose:  no external deformity.   Mouth:  good dentition.   Neck:  No deformities, masses, or tenderness noted. Breasts:  No mass, nodules, thickening,  tenderness, bulging, retraction, inflamation, nipple discharge or skin changes noted.   Lungs:  Normal respiratory effort, chest expands symmetrically. Lungs are clear to auscultation, no crackles or wheezes. Heart:  Normal rate and regular rhythm. S1 and S2 normal without gallop, murmur, click, rub or other extra sounds. Abdomen:  Bowel sounds positive,abdomen soft and non-tender without masses, organomegaly or hernias noted. Rectal:  no external abnormalities.   Genitalia:  Pelvic Exam:        External: normal female genitalia without lesions or masses        Vagina: normal without lesions or masses        Cervix: normal without lesions or masses        Adnexa: normal bimanual exam without masses  or fullness        Uterus: normal by palpation        Pap smear: performed Msk:  No deformity or scoliosis noted of thoracic or lumbar spine.   Extremities:  No clubbing, cyanosis, edema, or deformity noted with normal full range of motion of all joints.   Neurologic:  alert & oriented X3 and gait normal.   Skin:  Intact without suspicious lesions or rashes Cervical Nodes:  No lymphadenopathy noted Axillary Nodes:  No palpable lymphadenopathy Psych:  Cognition and judgment appear intact. Alert and cooperative with normal attention span and concentration. No apparent delusions, illusions, hallucinations       Assessment & Plan:   1. HYPERLIPIDEMIA  Lipid Panel  2. HYPERTENSION  Improved.  Continue current meds. Comprehensive metabolic panel  3. Cluster headache  Improved, continue current meds.   4. Routine general medical examination at a health care facility  Reviewed preventive care protocols, scheduled due services, and updated immunizations Discussed nutrition, exercise, diet, and healthy lifestyle.  Comprehensive metabolic panel, Cytology - PAP

## 2012-04-22 NOTE — Patient Instructions (Signed)
Good to see you. Please say hello to your husband for me. We will call you with your lab results and results from you pap smear.

## 2012-04-24 ENCOUNTER — Other Ambulatory Visit: Payer: Self-pay | Admitting: *Deleted

## 2012-04-24 MED ORDER — SIMVASTATIN 20 MG PO TABS: 20.0000 mg | ORAL_TABLET | Freq: Every evening | ORAL | Status: DC

## 2012-05-01 ENCOUNTER — Encounter: Payer: Self-pay | Admitting: Family Medicine

## 2012-05-01 ENCOUNTER — Encounter: Payer: Self-pay | Admitting: *Deleted

## 2012-05-01 LAB — HM PAP SMEAR: HM Pap smear: NORMAL

## 2012-05-22 ENCOUNTER — Other Ambulatory Visit: Payer: Self-pay | Admitting: *Deleted

## 2012-05-22 MED ORDER — SIMVASTATIN 20 MG PO TABS: 20.0000 mg | ORAL_TABLET | Freq: Every evening | ORAL | Status: DC

## 2012-07-16 ENCOUNTER — Other Ambulatory Visit: Payer: Self-pay | Admitting: Family Medicine

## 2012-07-16 DIAGNOSIS — E785 Hyperlipidemia, unspecified: Secondary | ICD-10-CM

## 2012-07-16 DIAGNOSIS — I10 Essential (primary) hypertension: Secondary | ICD-10-CM

## 2012-07-22 ENCOUNTER — Other Ambulatory Visit: Payer: Private Health Insurance - Indemnity

## 2012-09-29 ENCOUNTER — Ambulatory Visit (INDEPENDENT_AMBULATORY_CARE_PROVIDER_SITE_OTHER): Payer: Private Health Insurance - Indemnity | Admitting: Family Medicine

## 2012-09-29 ENCOUNTER — Encounter: Payer: Self-pay | Admitting: Family Medicine

## 2012-09-29 VITALS — BP 170/84 | HR 72 | Temp 98.1°F | Wt 245.0 lb

## 2012-09-29 DIAGNOSIS — I10 Essential (primary) hypertension: Secondary | ICD-10-CM

## 2012-09-29 DIAGNOSIS — M26629 Arthralgia of temporomandibular joint, unspecified side: Secondary | ICD-10-CM

## 2012-09-29 DIAGNOSIS — Z23 Encounter for immunization: Secondary | ICD-10-CM

## 2012-09-29 MED ORDER — CYCLOBENZAPRINE HCL 10 MG PO TABS: 10.0000 mg | ORAL_TABLET | Freq: Every day | ORAL | Status: DC

## 2012-09-29 NOTE — Patient Instructions (Addendum)
Please take flexeril 10 mg nightly x 2 weeks. Call me in 2 weeks with an update.

## 2012-09-29 NOTE — Progress Notes (Signed)
Subjective:    Patient ID: Erika [name], female    DOB: [birth year], 41 y.o.   MRN: [id number]  HPI  41 yo here for:  Cluster headaches- improved.  On verapamil 240 mg daily. Less severe when they do occur.  Also occuring less frequently.    HTN- On Verapamil (see above) and HCTZ 25 mg daily. BP elevated but just took her medication when she got here. No HA, blurred vision or SOB.   Right jaw pain- has a h/o TMJ.  Cannot afford the mouth guard dentist told her she needed. Does grind her teeth at night. Last several weeks- right jaw pain, cannot open jaw very far without pain. Feels like her typical TMJ pain.  Has never taken medication for it.  Patient Active Problem List  Diagnosis  . HYPERLIPIDEMIA  . ADJUSTMENT DISORDER WITH MIXED FEATURES  . ACUTE SEROUS OTITIS MEDIA  . ESSENTIAL HYPERTENSION, BENIGN  . HYPERTENSION  . TMJ PAIN  . Cluster headache  . Accessory skin tags  . Routine general medical examination at a health care facility   Past Medical History  Diagnosis Date  . Acute serous otitis media   . Adjustment disorder with mixed anxiety and depressed mood   . Essential hypertension, benign   . Other and unspecified hyperlipidemia   . Unspecified essential hypertension   . Arthralgia of temporomandibular joint    Past Surgical History  Procedure Date  . Cholecystectomy   . Tonsillectomy   . Cesarean section    History  Substance Use Topics  . Smoking status: Current Every Day Smoker  . Smokeless tobacco: Not on file  . Alcohol Use: No   Family History  Problem Relation Age of Onset  . Diabetes Mother   . Diabetes Father   . Alcohol abuse Father    No Known Allergies Current Outpatient Prescriptions on File Prior to Visit  Medication Sig Dispense Refill  . albuterol (PROVENTIL HFA;VENTOLIN HFA) 108 (90 BASE) MCG/ACT inhaler Inhale 2 puffs into the lungs every 6 (six) hours as needed for wheezing.  1 Inhaler  0  . hydrochlorothiazide  (HYDRODIURIL) 25 MG tablet Take 0.5 tablets (12.5 mg total) by mouth daily.  90 tablet  3  . simvastatin (ZOCOR) 20 MG tablet Take 1 tablet (20 mg total) by mouth every evening.  90 tablet  1  . verapamil (CALAN-SR) 240 MG CR tablet Take 1 tablet (240 mg total) by mouth at bedtime.  90 tablet  3   The PMH, PSH, Social History, Family History, Medications, and allergies have been reviewed in [facility], and have been updated if relevant.   Review of Systems No blurred vision. No CP, No SOB. No UE or LE weakness.    Objective:   Physical Exam  BP 184/114  Pulse 72  Temp 98.1 F (36.7 C)  Wt 245 lb (111.131 kg) BP 184/114  Pulse 72  Temp 98.1 F (36.7 C)  Wt 245 lb (111.131 kg)  BP Readings from Last 3 Encounters:  09/29/12 184/114  04/22/12 148/90  04/03/12 180/100    General:  Well-developed,well-nourished,in no acute distress; alert,appropriate and cooperative throughout examination Head:  normocephalic and atraumatic.   Eyes:  vision grossly intact, pupils equal, pupils round, and pupils reactive to light.   Ears:  R ear normal and L ear normal.   Nose:  no external deformity.   Mouth:  good dentition.  Pain with opening mouth less than 1 cm, otherwise benign exam  Neck:  No deformities, masses, or tenderness noted. Lungs:  Normal respiratory effort, chest expands symmetrically. Lungs are clear to auscultation, no crackles or wheezes. Heart:  Normal rate and regular rhythm. S1 and S2 normal without gallop, murmur, click, rub or other extra sounds. Abdomen:  Bowel sounds positive,abdomen soft and non-tender without masses, organomegaly or hernias noted. Msk:  No deformity or scoliosis noted of thoracic or lumbar spine.   Extremities:  No clubbing, cyanosis, edema, or deformity noted with normal full range of motion of all joints.   Neurologic:  alert & oriented X3 and gait normal.   Skin:  Intact without suspicious lesions or rashes Cervical Nodes:  No lymphadenopathy  noted Axillary Nodes:  No palpable lymphadenopathy Psych:  Cognition and judgment appear intact. Alert and cooperative with normal attention span and concentration. No apparent delusions, illusions, hallucinations       Assessment & Plan:    1. HYPERTENSION  Deteriorated, asymptomatic. She does not have a home BP cuff. She will schedule a quick BP recheck here this week.  2. TMJ PAIN  Deteriorated. Will try two weeks of nightly flexeril, NSAIDs. If no improvement, consider elavil. The patient indicates understanding of these issues and agrees with the plan.

## 2012-09-29 NOTE — Addendum Note (Signed)
Addended by: Eliezer Bottom on: 09/29/2012 09:27 AM   Modules accepted: Orders

## 2012-10-02 ENCOUNTER — Ambulatory Visit: Payer: Private Health Insurance - Indemnity | Admitting: Family Medicine

## 2012-10-02 DIAGNOSIS — Z0289 Encounter for other administrative examinations: Secondary | ICD-10-CM

## 2012-10-28 ENCOUNTER — Encounter: Payer: Self-pay | Admitting: Family Medicine

## 2012-10-28 ENCOUNTER — Ambulatory Visit (INDEPENDENT_AMBULATORY_CARE_PROVIDER_SITE_OTHER): Payer: Private Health Insurance - Indemnity | Admitting: Family Medicine

## 2012-10-28 VITALS — BP 154/102 | Temp 98.2°F | Wt 242.0 lb

## 2012-10-28 DIAGNOSIS — I10 Essential (primary) hypertension: Secondary | ICD-10-CM

## 2012-10-28 DIAGNOSIS — G44009 Cluster headache syndrome, unspecified, not intractable: Secondary | ICD-10-CM

## 2012-10-28 MED ORDER — SUMATRIPTAN 20 MG/ACT NA SOLN: 1.0000 | NASAL | Status: DC | PRN

## 2012-10-28 MED ORDER — RIZATRIPTAN BENZOATE 10 MG PO TABS: 10.0000 mg | ORAL_TABLET | ORAL | Status: DC | PRN

## 2012-10-28 NOTE — Patient Instructions (Addendum)
Great to see you. We are starting Maxalt.  Call me later this week with an update.

## 2012-10-28 NOTE — Progress Notes (Signed)
Subjective:    Patient ID: Erika [name], female    DOB: September 24, 1971, 41 y.o.   MRN: [id number]  HPI  41 yo here for:  Cluster headaches- deteriorated.  Has had a HA for past 6 days- associated with right eye tearing, blurring and nausea.  This is very similar to her other cluster headaches.  On verapamil 240 mg daily. They had been occuring less frequently until recently but she is under significant stress at work.  She is asking for me to write her out of work for the rest of the month.  On phone and computer all day which worsens her HA.  HTN- On Verapamil (see above) and HCTZ 25 mg daily. BP elevated but just took her medication when she got here. No HA, blurred vision or SOB.    Patient Active Problem List  Diagnosis  . HYPERLIPIDEMIA  . ADJUSTMENT DISORDER WITH MIXED FEATURES  . ACUTE SEROUS OTITIS MEDIA  . ESSENTIAL HYPERTENSION, BENIGN  . HYPERTENSION  . TMJ PAIN  . Cluster headache  . Accessory skin tags  . Routine general medical examination at a health care facility   Past Medical History  Diagnosis Date  . Acute serous otitis media   . Adjustment disorder with mixed anxiety and depressed mood   . Essential hypertension, benign   . Other and unspecified hyperlipidemia   . Unspecified essential hypertension   . Arthralgia of temporomandibular joint    Past Surgical History  Procedure Date  . Cholecystectomy   . Tonsillectomy   . Cesarean section    History  Substance Use Topics  . Smoking status: Current Every Day Smoker  . Smokeless tobacco: Not on file  . Alcohol Use: No   Family History  Problem Relation Age of Onset  . Diabetes Mother   . Diabetes Father   . Alcohol abuse Father    No Known Allergies Current Outpatient Prescriptions on File Prior to Visit  Medication Sig Dispense Refill  . albuterol (PROVENTIL HFA;VENTOLIN HFA) 108 (90 BASE) MCG/ACT inhaler Inhale 2 puffs into the lungs every 6 (six) hours as needed for wheezing.  1  Inhaler  0  . cyclobenzaprine (FLEXERIL) 10 MG tablet Take 1 tablet (10 mg total) by mouth at bedtime.  30 tablet  0  . hydrochlorothiazide (HYDRODIURIL) 25 MG tablet Take 0.5 tablets (12.5 mg total) by mouth daily.  90 tablet  3  . simvastatin (ZOCOR) 20 MG tablet Take 1 tablet (20 mg total) by mouth every evening.  90 tablet  1  . verapamil (CALAN-SR) 240 MG CR tablet Take 1 tablet (240 mg total) by mouth at bedtime.  90 tablet  3   The PMH, PSH, Social History, Family History, Medications, and allergies have been reviewed in [facility], and have been updated if relevant.   Review of Systems No blurred vision. No CP, No SOB. No UE or LE weakness.    Objective:   Physical Exam  BP 154/102  Temp 98.2 F (36.8 C)  Wt 242 lb (109.77 kg)   BP Readings from Last 3 Encounters:  10/28/12 154/102  09/29/12 170/84  04/22/12 148/90    General:  Well-developed,well-nourished,in no acute distress; alert,appropriate and cooperative throughout examination Head:  normocephalic and atraumatic.   Eyes:  vision grossly intact, pupils equal, pupils round, and pupils reactive to light.   Ears:  R ear normal and L ear normal.   Nose:  no external deformity.   Mouth:  good dentition.  Neck:  No deformities, masses, or tenderness noted. Lungs:  Normal respiratory effort, chest expands symmetrically. Lungs are clear to auscultation, no crackles or wheezes. Heart:  Normal rate and regular rhythm. S1 and S2 normal without gallop, murmur, click, rub or other extra sounds. Abdomen:  Bowel sounds positive,abdomen soft and non-tender without masses, organomegaly or hernias noted. Msk:  No deformity or scoliosis noted of thoracic or lumbar spine.   Extremities:  No clubbing, cyanosis, edema, or deformity noted with normal full range of motion of all joints.   Neurologic:  alert & oriented X3 and gait normal.   Skin:  Intact without suspicious lesions or rashes Cervical Nodes:  No lymphadenopathy  noted Axillary Nodes:  No palpable lymphadenopathy Psych:  Cognition and judgment appear intact. Alert and cooperative with normal attention span and concentration. No apparent delusions, illusions, hallucinations       Assessment & Plan:    1. HYPERTENSION  Slightly improved, asymptomatic. Likely a little high due to pain.  She will follow up with me in 2 weeks.  2. Cluster HA Deteriorated. Will try triptan.  [employer] forms filled out and faxed today.

## 2012-11-17 ENCOUNTER — Ambulatory Visit (INDEPENDENT_AMBULATORY_CARE_PROVIDER_SITE_OTHER): Payer: Private Health Insurance - Indemnity | Admitting: Family Medicine

## 2012-11-17 ENCOUNTER — Encounter: Payer: Self-pay | Admitting: Family Medicine

## 2012-11-17 VITALS — BP 160/90 | HR 100 | Temp 97.9°F | Wt 238.0 lb

## 2012-11-17 DIAGNOSIS — G44009 Cluster headache syndrome, unspecified, not intractable: Secondary | ICD-10-CM

## 2012-11-17 DIAGNOSIS — I10 Essential (primary) hypertension: Secondary | ICD-10-CM

## 2012-11-17 MED ORDER — OXYCODONE-ACETAMINOPHEN 7.5-325 MG PO TABS: 1.0000 | ORAL_TABLET | ORAL | Status: DC | PRN

## 2012-11-17 NOTE — Progress Notes (Signed)
Subjective:    Patient ID: Erika [name], female    DOB: Sep 30, 1971, 41 y.o.   MRN: [id number]  HPI  41 yo here for:  Cluster headaches- deteriorated.   They had been occuring less frequently until recently but she is under significant stress at work.   Added a Triptan 2 weeks ago without any improvement.  She asked for me to write her out of work for the rest of the month.  On phone and computer all day which worsens her HA. Also at times has blurred vision.  HTN- On Verapamil (see above) and HCTZ 25 mg daily. BP elevated but has not taken her medication today. No HA, blurred vision or SOB.    Patient Active Problem List  Diagnosis  . HYPERLIPIDEMIA  . ADJUSTMENT DISORDER WITH MIXED FEATURES  . ACUTE SEROUS OTITIS MEDIA  . ESSENTIAL HYPERTENSION, BENIGN  . HYPERTENSION  . TMJ PAIN  . Cluster headache  . Accessory skin tags  . Routine general medical examination at a health care facility   Past Medical History  Diagnosis Date  . Acute serous otitis media   . Adjustment disorder with mixed anxiety and depressed mood   . Essential hypertension, benign   . Other and unspecified hyperlipidemia   . Unspecified essential hypertension   . Arthralgia of temporomandibular joint    Past Surgical History  Procedure Date  . Cholecystectomy   . Tonsillectomy   . Cesarean section    History  Substance Use Topics  . Smoking status: Current Every Day Smoker  . Smokeless tobacco: Not on file  . Alcohol Use: No   Family History  Problem Relation Age of Onset  . Diabetes Mother   . Diabetes Father   . Alcohol abuse Father    No Known Allergies Current Outpatient Prescriptions on File Prior to Visit  Medication Sig Dispense Refill  . albuterol (PROVENTIL HFA;VENTOLIN HFA) 108 (90 BASE) MCG/ACT inhaler Inhale 2 puffs into the lungs every 6 (six) hours as needed for wheezing.  1 Inhaler  0  . cyclobenzaprine (FLEXERIL) 10 MG tablet Take 1 tablet (10 mg total) by  mouth at bedtime.  30 tablet  0  . hydrochlorothiazide (HYDRODIURIL) 25 MG tablet Take 0.5 tablets (12.5 mg total) by mouth daily.  90 tablet  3  . rizatriptan (MAXALT) 10 MG tablet Take 1 tablet (10 mg total) by mouth as needed for migraine. May repeat in 2 hours if needed  10 tablet  0  . simvastatin (ZOCOR) 20 MG tablet Take 1 tablet (20 mg total) by mouth every evening.  90 tablet  1  . verapamil (CALAN-SR) 240 MG CR tablet Take 1 tablet (240 mg total) by mouth at bedtime.  90 tablet  3   The PMH, PSH, Social History, Family History, Medications, and allergies have been reviewed in [facility], and have been updated if relevant.   Review of Systems No blurred vision. No CP, No SOB. No UE or LE weakness.    Objective:   Physical Exam BP Readings from Last 3 Encounters:  11/17/12 160/100  10/28/12 154/102  09/29/12 170/84    Wt 238 lb (107.956 kg)   BP Readings from Last 3 Encounters:  10/28/12 154/102  09/29/12 170/84  04/22/12 148/90    General:  Well-developed,well-nourished,in no acute distress; alert,appropriate and cooperative throughout examination Head:  normocephalic and atraumatic.   Eyes:  vision grossly intact, pupils equal, pupils round, and pupils reactive to light.   Ears:  R  ear normal and L ear normal.   Nose:  no external deformity.   Mouth:  good dentition.  Neck:  No deformities, masses, or tenderness noted. Lungs:  Normal respiratory effort, chest expands symmetrically. Lungs are clear to auscultation, no crackles or wheezes. Heart:  Normal rate and regular rhythm. S1 and S2 normal without gallop, murmur, click, rub or other extra sounds. Abdomen:  Bowel sounds positive,abdomen soft and non-tender without masses, organomegaly or hernias noted. Msk:  No deformity or scoliosis noted of thoracic or lumbar spine.   Extremities:  No clubbing, cyanosis, edema, or deformity noted with normal full range of motion of all joints.   Neurologic:  alert & oriented X3 and  gait normal.   Skin:  Intact without suspicious lesions or rashes Cervical Nodes:  No lymphadenopathy noted Axillary Nodes:  No palpable lymphadenopathy Psych:  Cognition and judgment appear intact. Alert and cooperative with normal attention span and concentration. No apparent delusions, illusions, hallucinations       Assessment & Plan:    1. HYPERTENSION  Slightly improved, asymptomatic. Likely a little high due to pain and not taking her medications.    2. Cluster HA Deteriorated. Refer to neurology for further evaluation and management.  [employer] forms filled out and faxed today.

## 2012-11-17 NOTE — Patient Instructions (Addendum)
Good to see you. Please stop by to see Erika [name] on your way out to set up your neurology appointment.

## 2012-11-19 ENCOUNTER — Other Ambulatory Visit: Payer: Self-pay | Admitting: Family Medicine

## 2012-11-19 ENCOUNTER — Telehealth: Payer: Self-pay | Admitting: *Deleted

## 2012-11-19 NOTE — Telephone Encounter (Signed)
Copy of office note from 11/17/12 and med list faxed to [facility] [employer].  Pt saw neurologist yesterday, 11/18/12.

## 2012-12-03 ENCOUNTER — Ambulatory Visit: Payer: Self-pay | Admitting: Neurology

## 2012-12-08 ENCOUNTER — Ambulatory Visit (INDEPENDENT_AMBULATORY_CARE_PROVIDER_SITE_OTHER): Payer: Managed Care, Other (non HMO) | Admitting: Family Medicine

## 2012-12-08 ENCOUNTER — Encounter: Payer: Self-pay | Admitting: Family Medicine

## 2012-12-08 VITALS — BP 160/102 | HR 76 | Temp 97.7°F | Wt 239.0 lb

## 2012-12-08 DIAGNOSIS — G44009 Cluster headache syndrome, unspecified, not intractable: Secondary | ICD-10-CM

## 2012-12-08 DIAGNOSIS — I10 Essential (primary) hypertension: Secondary | ICD-10-CM

## 2012-12-08 MED ORDER — TRAMADOL HCL 50 MG PO TABS: 50.0000 mg | ORAL_TABLET | Freq: Three times a day (TID) | ORAL | Status: DC | PRN

## 2012-12-08 NOTE — Patient Instructions (Addendum)
Good to see you. We are trying Tramadol.  Please call me with an update.

## 2012-12-08 NOTE — Progress Notes (Signed)
Subjective:    Patient ID: Erika [name], female    DOB: [birth year], 41 y.o.   MRN: [id number]  HPI  41 yo here for:  Cluster headaches- deteriorated.   They had been occuring less frequently until recently but she is under significant stress at work.   Added a Triptan last month without any improvement so I referred her to neurology. Saw Dr. Sherryll Burger on 11/18/2012- note reviewed. Neuro feels she is having episodic cluster HA vs Migraine HA.  Given O2 to use at home as needed but she could not afford to get this.  Also given a prednisone dose pack to break HA cycle.  Added Gabapentin 300 mg qhs x 2 weeks and increased to 600 mg twice daily for prevention. MRI also ordered but results not yet available.  She feels like nothing is helping.  Having a right sided headache almost daily with blurred vision of right eye.  Only thing that helps with pain is percocet.  She asked for me to write her out of work for the rest of the month.  On phone and computer all day which worsens her HA. Also at times has blurred vision.  HTN- On Verapamil (see above) and HCTZ 25 mg daily. BP elevated but has not taken her medication today and she feels pain is related to pain. No HA, blurred vision or SOB.    Patient Active Problem List  Diagnosis  . HYPERLIPIDEMIA  . ADJUSTMENT DISORDER WITH MIXED FEATURES  . ACUTE SEROUS OTITIS MEDIA  . ESSENTIAL HYPERTENSION, BENIGN  . HYPERTENSION  . TMJ PAIN  . Cluster headache  . Accessory skin tags  . Routine general medical examination at a health care facility   Past Medical History  Diagnosis Date  . Acute serous otitis media   . Adjustment disorder with mixed anxiety and depressed mood   . Essential hypertension, benign   . Other and unspecified hyperlipidemia   . Unspecified essential hypertension   . Arthralgia of temporomandibular joint    Past Surgical History  Procedure Date  . Cholecystectomy   . Tonsillectomy   . Cesarean section     History  Substance Use Topics  . Smoking status: Current Every Day Smoker  . Smokeless tobacco: Not on file  . Alcohol Use: No   Family History  Problem Relation Age of Onset  . Diabetes Mother   . Diabetes Father   . Alcohol abuse Father    No Known Allergies Current Outpatient Prescriptions on File Prior to Visit  Medication Sig Dispense Refill  . albuterol (PROVENTIL HFA;VENTOLIN HFA) 108 (90 BASE) MCG/ACT inhaler Inhale 2 puffs into the lungs every 6 (six) hours as needed for wheezing.  1 Inhaler  0  . cyclobenzaprine (FLEXERIL) 10 MG tablet Take 1 tablet (10 mg total) by mouth at bedtime.  30 tablet  0  . hydrochlorothiazide (HYDRODIURIL) 25 MG tablet Take 0.5 tablets (12.5 mg total) by mouth daily.  90 tablet  3  . oxyCODONE-acetaminophen (PERCOCET) 7.5-325 MG per tablet Take 1 tablet by mouth every 4 (four) hours as needed for pain.  60 tablet  0  . rizatriptan (MAXALT) 10 MG tablet Take 1 tablet (10 mg total) by mouth as needed for migraine. May repeat in 2 hours if needed  10 tablet  0  . simvastatin (ZOCOR) 20 MG tablet TAKE 1 TABLET (20 MG TOTAL) BY MOUTH EVERY EVENING.  90 tablet  1  . verapamil (CALAN-SR) 240 MG CR tablet Take 1  tablet (240 mg total) by mouth at bedtime.  90 tablet  3   The PMH, PSH, Social History, Family History, Medications, and allergies have been reviewed in [facility], and have been updated if relevant.   Review of Systems  No CP, No SOB. No UE or LE weakness.    Objective:   Physical Exam BP 160/102  Pulse 76  Temp 97.7 F (36.5 C)  Wt 239 lb (108.41 kg)  General:  Well-developed,well-nourished,in no acute distress; alert,appropriate and cooperative throughout examination Head:  normocephalic and atraumatic.   Eyes:  vision grossly intact, pupils equal, pupils round, and pupils reactive to light.   Ears:  R ear normal and L ear normal.   Nose:  no external deformity.   Mouth:  good dentition.  Neck:  No deformities, masses, or tenderness  noted. Lungs:  Normal respiratory effort, chest expands symmetrically. Lungs are clear to auscultation, no crackles or wheezes. Heart:  Normal rate and regular rhythm. S1 and S2 normal without gallop, murmur, click, rub or other extra sounds. Abdomen:  Bowel sounds positive,abdomen soft and non-tender without masses, organomegaly or hernias noted. Msk:  No deformity or scoliosis noted of thoracic or lumbar spine.   Extremities:  No clubbing, cyanosis, edema, or deformity noted with normal full range of motion of all joints.   Neurologic:  alert & oriented X3 and gait normal.   Skin:  Intact without suspicious lesions or rashes Cervical Nodes:  No lymphadenopathy noted Axillary Nodes:  No palpable lymphadenopathy Psych:  Cognition and judgment appear intact. Alert and cooperative with normal attention span and concentration. No apparent delusions, illusions, hallucinations       Assessment & Plan:    1. HYPERTENSION  Slightly improved, asymptomatic. Likely a little high due to pain and not taking her medications.    2. Cluster HA Unchanged- advised calling home health company to discuss payment options.  Will request MRI results from Dr. Sherryll Burger.  Write out of work until 12/26/12.  We will also try Tramadol as needed.  The patient indicates understanding of these issues and agrees with the plan.

## 2012-12-26 ENCOUNTER — Encounter: Payer: Self-pay | Admitting: Family Medicine

## 2012-12-26 ENCOUNTER — Ambulatory Visit (INDEPENDENT_AMBULATORY_CARE_PROVIDER_SITE_OTHER): Payer: Managed Care, Other (non HMO) | Admitting: Family Medicine

## 2012-12-26 VITALS — BP 160/100 | HR 96 | Temp 98.2°F | Ht 67.0 in | Wt 238.0 lb

## 2012-12-26 DIAGNOSIS — I10 Essential (primary) hypertension: Secondary | ICD-10-CM

## 2012-12-26 DIAGNOSIS — G44009 Cluster headache syndrome, unspecified, not intractable: Secondary | ICD-10-CM

## 2012-12-26 MED ORDER — VERAPAMIL HCL ER 360 MG PO CP24: 360.0000 mg | ORAL_CAPSULE | Freq: Every day | ORAL | Status: DC

## 2012-12-26 MED ORDER — OXYCODONE-ACETAMINOPHEN 7.5-325 MG PO TABS: 1.0000 | ORAL_TABLET | ORAL | Status: DC | PRN

## 2012-12-26 NOTE — Patient Instructions (Addendum)
Good to see you. I am so sorry to hear about your father in law.  We are increasing you Verapamil to 360 mg daily.   Please come back to see me in 2 weeks to recheck your blood pressure.

## 2012-12-26 NOTE — Progress Notes (Signed)
Subjective:    Patient ID: Erika [name], female    DOB: Jul 28, 1971, 42 y.o.   MRN: [id number]  HPI  42 yo here for follow up.  Cluster headaches- deteriorated.    Added a Triptan in November without any improvement so I referred her to neurology. Saw Dr. Sherryll Burger on 11/18/2012- note reviewed. Neuro feels she is having episodic cluster HA vs Migraine HA.  Given O2 to use at home as needed but she could not afford to get this so I advised her to call home health to discuss payment plan- she has not yet had the time to do this.  Also given a prednisone dose pack to break HA cycle which she felt did not help. Added Gabapentin 300 mg qhs x 2 weeks and increased to 600 mg twice daily for prevention. MRI negative.  She feels like nothing is helping.  Having a right sided headache almost daily with blurred vision of right eye.  Only thing that helps with pain is percocet.  I added Tramadol at last office visit which she felt also did not help.  She asked for me to write her out of work for the rest of December at last office visit.  On phone and computer all day which worsens her HA. Also at times has blurred vision.  HTN- On Verapamil (see above) and HCTZ 25 mg daily. BP elevated again today.    Patient Active Problem List  Diagnosis  . HYPERLIPIDEMIA  . ADJUSTMENT DISORDER WITH MIXED FEATURES  . ACUTE SEROUS OTITIS MEDIA  . ESSENTIAL HYPERTENSION, BENIGN  . HYPERTENSION  . TMJ PAIN  . Cluster headache  . Accessory skin tags  . Routine general medical examination at a health care facility   Past Medical History  Diagnosis Date  . Acute serous otitis media   . Adjustment disorder with mixed anxiety and depressed mood   . Essential hypertension, benign   . Other and unspecified hyperlipidemia   . Unspecified essential hypertension   . Arthralgia of temporomandibular joint    Past Surgical History  Procedure Date  . Cholecystectomy   . Tonsillectomy   . Cesarean section     History  Substance Use Topics  . Smoking status: Current Every Day Smoker  . Smokeless tobacco: Not on file  . Alcohol Use: No   Family History  Problem Relation Age of Onset  . Diabetes Mother   . Diabetes Father   . Alcohol abuse Father    No Known Allergies Current Outpatient Prescriptions on File Prior to Visit  Medication Sig Dispense Refill  . albuterol (PROVENTIL HFA;VENTOLIN HFA) 108 (90 BASE) MCG/ACT inhaler Inhale 2 puffs into the lungs every 6 (six) hours as needed for wheezing.  1 Inhaler  0  . cyclobenzaprine (FLEXERIL) 10 MG tablet Take 1 tablet (10 mg total) by mouth at bedtime.  30 tablet  0  . GABAPENTIN PO Take one by mouth twice a day- patient unsure of strength      . hydrochlorothiazide (HYDRODIURIL) 25 MG tablet Take 0.5 tablets (12.5 mg total) by mouth daily.  90 tablet  3  . oxyCODONE-acetaminophen (PERCOCET) 7.5-325 MG per tablet Take 1 tablet by mouth every 4 (four) hours as needed for pain.  60 tablet  0  . rizatriptan (MAXALT) 10 MG tablet Take 1 tablet (10 mg total) by mouth as needed for migraine. May repeat in 2 hours if needed  10 tablet  0  . simvastatin (ZOCOR) 20 MG tablet TAKE  1 TABLET (20 MG TOTAL) BY MOUTH EVERY EVENING.  90 tablet  1  . traMADol (ULTRAM) 50 MG tablet Take 1 tablet (50 mg total) by mouth every 8 (eight) hours as needed for pain.  30 tablet  0  . verapamil (CALAN-SR) 240 MG CR tablet Take 1 tablet (240 mg total) by mouth at bedtime.  90 tablet  3   The PMH, PSH, Social History, Family History, Medications, and allergies have been reviewed in [facility], and have been updated if relevant.   Review of Systems  No CP, No SOB. No UE or LE weakness.    Objective:   Physical Exam BP 160/100  Pulse 96  Temp 98.2 F (36.8 C) (Oral)  Ht 5\' 7"  (1.702 m)  Wt 238 lb (107.956 kg)  BMI 37.28 kg/m2  SpO2 98%  General:  Well-developed,well-nourished,in no acute distress; alert,appropriate and cooperative throughout examination Head:   normocephalic and atraumatic.   Eyes:  vision grossly intact, pupils equal, pupils round, and pupils reactive to light.   Ears:  R ear normal and L ear normal.   Nose:  no external deformity.   Mouth:  good dentition.  Neck:  No deformities, masses, or tenderness noted. Lungs:  Normal respiratory effort, chest expands symmetrically. Lungs are clear to auscultation, no crackles or wheezes. Heart:  Normal rate and regular rhythm. S1 and S2 normal without gallop, murmur, click, rub or other extra sounds. Abdomen:  Bowel sounds positive,abdomen soft and non-tender without masses, organomegaly or hernias noted. Msk:  No deformity or scoliosis noted of thoracic or lumbar spine.   Extremities:  No clubbing, cyanosis, edema, or deformity noted with normal full range of motion of all joints.   Neurologic:  alert & oriented X3 and gait normal.   Skin:  Intact without suspicious lesions or rashes Cervical Nodes:  No lymphadenopathy noted Axillary Nodes:  No palpable lymphadenopathy Psych:  Cognition and judgment appear intact. Alert and cooperative with normal attention span and concentration. No apparent delusions, illusions, hallucinations       Assessment & Plan:    1. HYPERTENSION  Continues to remain high.  Will increase Verapamil to 360 mg qhs. Follow up in 2 weeks.  2. Cluster HA Unchanged- advised calling home health company again to discuss payment options and to make follow up with Dr. Sherryll Burger.  Will request out of work from 12/24/12- 01/05/2013 (return to work 01/05/2013). The patient indicates understanding of these issues and agrees with the plan.

## 2013-01-12 ENCOUNTER — Ambulatory Visit (INDEPENDENT_AMBULATORY_CARE_PROVIDER_SITE_OTHER): Payer: Managed Care, Other (non HMO) | Admitting: Family Medicine

## 2013-01-12 ENCOUNTER — Encounter: Payer: Self-pay | Admitting: Family Medicine

## 2013-01-12 VITALS — BP 142/90 | HR 88 | Temp 97.9°F | Wt 236.0 lb

## 2013-01-12 DIAGNOSIS — G44009 Cluster headache syndrome, unspecified, not intractable: Secondary | ICD-10-CM

## 2013-01-12 DIAGNOSIS — I10 Essential (primary) hypertension: Secondary | ICD-10-CM

## 2013-01-12 MED ORDER — OXYCODONE-ACETAMINOPHEN 7.5-325 MG PO TABS: 1.0000 | ORAL_TABLET | ORAL | Status: DC | PRN

## 2013-01-12 NOTE — Progress Notes (Signed)
Subjective:    Patient ID: Erika [name], female    DOB: Mar 01, 1971, 42 y.o.   MRN: [id number]  HPI  42 yo here for follow up.  Cluster headaches- deteriorated.  At last office visit, I advised calling home health company again to discuss payment options and to make follow up with Dr. Sherryll Burger.  She has not yet done this- she cannot find her oxygen rx.    She feels like her headaches are a little better.  She is back at work.  Still has a right sided headache several days per week with blurred vision of right eye.  Only thing that helps with pain is percocet.    HTN- On Verapamil which we increased on 12/16 and HCTZ 25 mg daily. BP much improved.  Denies any SOB or LE edema.    Patient Active Problem List  Diagnosis  . HYPERLIPIDEMIA  . ADJUSTMENT DISORDER WITH MIXED FEATURES  . ESSENTIAL HYPERTENSION, BENIGN  . HYPERTENSION  . TMJ PAIN  . Cluster headache  . Accessory skin tags  . Routine general medical examination at a health care facility   Past Medical History  Diagnosis Date  . Acute serous otitis media   . Adjustment disorder with mixed anxiety and depressed mood   . Essential hypertension, benign   . Other and unspecified hyperlipidemia   . Unspecified essential hypertension   . Arthralgia of temporomandibular joint    Past Surgical History  Procedure Date  . Cholecystectomy   . Tonsillectomy   . Cesarean section    History  Substance Use Topics  . Smoking status: Current Every Day Smoker  . Smokeless tobacco: Not on file  . Alcohol Use: No   Family History  Problem Relation Age of Onset  . Diabetes Mother   . Diabetes Father   . Alcohol abuse Father    No Known Allergies Current Outpatient Prescriptions on File Prior to Visit  Medication Sig Dispense Refill  . albuterol (PROVENTIL HFA;VENTOLIN HFA) 108 (90 BASE) MCG/ACT inhaler Inhale 2 puffs into the lungs every 6 (six) hours as needed for wheezing.  1 Inhaler  0  . cyclobenzaprine (FLEXERIL)  10 MG tablet Take 1 tablet (10 mg total) by mouth at bedtime.  30 tablet  0  . GABAPENTIN PO Take one by mouth twice a day- patient unsure of strength      . hydrochlorothiazide (HYDRODIURIL) 25 MG tablet Take 0.5 tablets (12.5 mg total) by mouth daily.  90 tablet  3  . oxyCODONE-acetaminophen (PERCOCET) 7.5-325 MG per tablet Take 1 tablet by mouth every 4 (four) hours as needed for pain.  60 tablet  0  . rizatriptan (MAXALT) 10 MG tablet Take 1 tablet (10 mg total) by mouth as needed for migraine. May repeat in 2 hours if needed  10 tablet  0  . simvastatin (ZOCOR) 20 MG tablet TAKE 1 TABLET (20 MG TOTAL) BY MOUTH EVERY EVENING.  90 tablet  1  . traMADol (ULTRAM) 50 MG tablet Take 1 tablet (50 mg total) by mouth every 8 (eight) hours as needed for pain.  30 tablet  0  . verapamil (VERELAN PM) 360 MG 24 hr capsule Take 1 capsule (360 mg total) by mouth at bedtime.  30 capsule  6   The PMH, PSH, Social History, Family History, Medications, and allergies have been reviewed in [facility], and have been updated if relevant.   Review of Systems  No CP, No SOB. No UE or LE weakness.  Objective:   Physical Exam BP 142/90  Pulse 88  Temp 97.9 F (36.6 C)  Wt 236 lb (107.049 kg)  General:  Well-developed,well-nourished,in no acute distress; alert,appropriate and cooperative throughout examination Head:  normocephalic and atraumatic.   Eyes:  vision grossly intact, pupils equal, pupils round, and pupils reactive to light.   Ears:  R ear normal and L ear normal.   Nose:  no external deformity.   Mouth:  good dentition.  Neck:  No deformities, masses, or tenderness noted. Lungs:  Normal respiratory effort, chest expands symmetrically. Lungs are clear to auscultation, no crackles or wheezes. Heart:  Normal rate and regular rhythm. S1 and S2 normal without gallop, murmur, click, rub or other extra sounds. Extremities:  No clubbing, cyanosis, edema, or deformity noted with normal full range of motion  of all joints.   Neurologic:  alert & oriented X3 and gait normal.   Skin:  Intact without suspicious lesions or rashes Psych:  Cognition and judgment appear intact. Alert and cooperative with normal attention span and concentration. No apparent delusions, illusions, hallucinations       Assessment & Plan:    1. HYPERTENSION  Improved with increased dose of verapamil. Follow up in 3 months.    2. Cluster HA Slightly improved.  Advised calling Dr. Margaretmary Eddy office to get another rx for oxygen. The patient indicates understanding of these issues and agrees with the plan.

## 2013-01-12 NOTE — Patient Instructions (Addendum)
Good to see you. Your blood pressure is much better!  I'm so sorry about the loss of your father in law.

## 2013-02-13 ENCOUNTER — Ambulatory Visit (INDEPENDENT_AMBULATORY_CARE_PROVIDER_SITE_OTHER): Payer: Managed Care, Other (non HMO) | Admitting: Internal Medicine

## 2013-02-13 ENCOUNTER — Ambulatory Visit (INDEPENDENT_AMBULATORY_CARE_PROVIDER_SITE_OTHER)
Admission: RE | Admit: 2013-02-13 | Discharge: 2013-02-13 | Disposition: A | Discharge status: Home / Self Care | Payer: Managed Care, Other (non HMO) | Source: Ambulatory Visit | Attending: Internal Medicine | Admitting: Internal Medicine

## 2013-02-13 ENCOUNTER — Encounter: Payer: Self-pay | Admitting: Internal Medicine

## 2013-02-13 VITALS — BP 170/98 | HR 85 | Temp 98.2°F | Wt 233.0 lb

## 2013-02-13 DIAGNOSIS — M545 Low back pain, unspecified: Secondary | ICD-10-CM

## 2013-02-13 MED ORDER — OXYCODONE-ACETAMINOPHEN 7.5-325 MG PO TABS: 1.0000 | ORAL_TABLET | ORAL | Status: DC | PRN

## 2013-02-13 NOTE — Progress Notes (Signed)
Subjective:    Patient ID: Erika [name], female    DOB: 1971/07/12, 42 y.o.   MRN: [id number]  HPI Having some "weird" pain in her back Usually only with menses This time it started with period 2 weeks ago, but never went away (usually goes away in 2 days)  Usually every month Sharp pain at first in low right back, then settles to dull pain No dysuria or increased frequency Still the dull pain, and occ sharp pain Tender with sharp pain  Appetite is down some---last 6 weeks or so Relates to cluster headaches and changed meds  No new tasks Sedentary job No apparent back injury  Current Outpatient Prescriptions on File Prior to Visit  Medication Sig Dispense Refill  . hydrochlorothiazide (HYDRODIURIL) 25 MG tablet Take 0.5 tablets (12.5 mg total) by mouth daily.  90 tablet  3  . oxyCODONE-acetaminophen (PERCOCET) 7.5-325 MG per tablet Take 1 tablet by mouth every 4 (four) hours as needed for pain.  60 tablet  0  . simvastatin (ZOCOR) 20 MG tablet TAKE 1 TABLET (20 MG TOTAL) BY MOUTH EVERY EVENING.  90 tablet  1  . verapamil (VERELAN PM) 360 MG 24 hr capsule Take 1 capsule (360 mg total) by mouth at bedtime.  30 capsule  6   No current facility-administered medications on file prior to visit.    No Known Allergies  Past Medical History  Diagnosis Date  . Acute serous otitis media   . Adjustment disorder with mixed anxiety and depressed mood   . Essential hypertension, benign   . Other and unspecified hyperlipidemia   . Unspecified essential hypertension   . Arthralgia of temporomandibular joint     Past Surgical History  Procedure Laterality Date  . Cholecystectomy    . Tonsillectomy    . Cesarean section      Family History  Problem Relation Age of Onset  . Diabetes Mother   . Diabetes Father   . Alcohol abuse Father     History   Social History  . Marital Status: Married    Spouse Name: [name]    Number of Children: [name]  . Years of Education: [name]    Occupational History  . Not on file.   Social History Main Topics  . Smoking status: Current Every Day Smoker  . Smokeless tobacco: Not on file  . Alcohol Use: No  . Drug Use: No  . Sexually Active: Not on file   Other Topics Concern  . Not on file   Social History Narrative  . No narrative on file   Review of Systems Known polycystic ovarian syndrome--but no identified ovarian cysts in past Bowels have been irregular Upset stomach with some foods---then will go more often. Other times slow Sees blood with stool at times---relates to know internal hemorrhoid    Objective:   Physical Exam  Constitutional: She appears well-developed and well-nourished. No distress.  Neck: Normal range of motion. Neck supple.  Pulmonary/Chest: Effort normal and breath sounds normal. No respiratory distress. She has no wheezes. She has no rales.  Abdominal: Soft. There is no tenderness.  Musculoskeletal: She exhibits no edema.  Mild tenderness along lumbar spine and lateral over soft tissue around L3 or so (just to light touch) SLR basically negative Back flexion to about 70 degrees  Lymphadenopathy:    She has no cervical adenopathy.  Neurological:  Normal gait No weakness in legs  Psychiatric: She has a normal mood and affect. Her behavior is  normal.          Assessment & Plan:

## 2013-02-13 NOTE — Assessment & Plan Note (Signed)
Puzzling situation Has regular 2 days of pain with menses but this has persisted for 2 weeks Very sensitive on surface suggesting spinal nerve involvement and is tender over spine----so will check lumbar spine x-rays Doesn't really seem like muscular issue No urinary problems Doubt ovarian pathology since she is sensitive on surface  No Rx for now ?consider gabapentin if persists

## 2013-02-26 DIAGNOSIS — Z0279 Encounter for issue of other medical certificate: Secondary | ICD-10-CM

## 2013-03-02 ENCOUNTER — Telehealth: Payer: Self-pay | Admitting: *Deleted

## 2013-03-02 NOTE — Telephone Encounter (Signed)
Patient has brought in new FMLA paperwork, this is on your desk and needs to be sent back in no later than 3/13.  I have filled out what I can on the paperwork, by going by previous completed forms, but patient states a flare up can last 10 days and previous paperwork has 3 to 5 days.  New forms and previous forms are on your desk for review.

## 2013-03-03 NOTE — Telephone Encounter (Signed)
Forms faxed, advised patient.

## 2013-03-03 NOTE — Telephone Encounter (Signed)
Form signed and in my box. 

## 2013-03-11 ENCOUNTER — Telehealth: Payer: Self-pay | Admitting: Family Medicine

## 2013-03-11 MED ORDER — OXYCODONE-ACETAMINOPHEN 7.5-325 MG PO TABS: 1.0000 | ORAL_TABLET | ORAL | Status: DC | PRN

## 2013-03-11 NOTE — Telephone Encounter (Signed)
Please enter as refill request. 

## 2013-03-11 NOTE — Telephone Encounter (Signed)
Patient needs a refill on her Percocet.  She states Dr. Dayton Martes told her she could call for a refill when she needs it.

## 2013-03-11 NOTE — Telephone Encounter (Signed)
Script placed on your desk for signature.

## 2013-03-12 NOTE — Telephone Encounter (Signed)
Rx signed and on my desk

## 2013-03-12 NOTE — Telephone Encounter (Signed)
Advised patient script is ready for pick up, script placed at front desk. 

## 2013-04-02 ENCOUNTER — Ambulatory Visit (INDEPENDENT_AMBULATORY_CARE_PROVIDER_SITE_OTHER): Payer: Managed Care, Other (non HMO) | Admitting: Family Medicine

## 2013-04-02 ENCOUNTER — Encounter: Payer: Self-pay | Admitting: Family Medicine

## 2013-04-02 VITALS — BP 160/100 | HR 84 | Temp 98.0°F | Wt 231.0 lb

## 2013-04-02 DIAGNOSIS — R5383 Other fatigue: Secondary | ICD-10-CM

## 2013-04-02 DIAGNOSIS — R5381 Other malaise: Secondary | ICD-10-CM | POA: Insufficient documentation

## 2013-04-02 DIAGNOSIS — I10 Essential (primary) hypertension: Secondary | ICD-10-CM

## 2013-04-02 DIAGNOSIS — G44009 Cluster headache syndrome, unspecified, not intractable: Secondary | ICD-10-CM

## 2013-04-02 LAB — CBC WITH DIFFERENTIAL/PLATELET
Basophils Absolute: 0 10*3/uL (ref 0.0–0.1)
Basophils Relative: 0.1 % (ref 0.0–3.0)
HCT: 49.3 % — ABNORMAL HIGH (ref 36.0–46.0)
Hemoglobin: 16.6 g/dL — ABNORMAL HIGH (ref 12.0–15.0)
Lymphocytes Relative: 23.1 % (ref 12.0–46.0)
Lymphs Abs: 3.6 10*3/uL (ref 0.7–4.0)
MCHC: 33.7 g/dL (ref 30.0–36.0)
Monocytes Relative: 3.8 % (ref 3.0–12.0)
Neutro Abs: 10.9 10*3/uL — ABNORMAL HIGH (ref 1.4–7.7)
RBC: 5.57 Mil/uL — ABNORMAL HIGH (ref 3.87–5.11)
RDW: 13.1 % (ref 11.5–14.6)

## 2013-04-02 LAB — COMPREHENSIVE METABOLIC PANEL
BUN: 10 mg/dL (ref 6–23)
CO2: 29 mEq/L (ref 19–32)
Calcium: 10.6 mg/dL — ABNORMAL HIGH (ref 8.4–10.5)
Chloride: 97 mEq/L (ref 96–112)
Creatinine, Ser: 0.8 mg/dL (ref 0.4–1.2)
GFR: 88.55 mL/min (ref >60.00)
Total Bilirubin: 0.5 mg/dL (ref 0.3–1.2)

## 2013-04-02 LAB — VITAMIN B12: Vitamin B-12: 242 pg/mL (ref 211–911)

## 2013-04-02 MED ORDER — OXYCODONE-ACETAMINOPHEN 7.5-325 MG PO TABS: 1.0000 | ORAL_TABLET | ORAL | Status: DC | PRN

## 2013-04-02 MED ORDER — ENALAPRIL MALEATE 20 MG PO TABS: 20.0000 mg | ORAL_TABLET | Freq: Every day | ORAL | Status: DC

## 2013-04-02 NOTE — Progress Notes (Signed)
Subjective:    Patient ID: Erika [name], female    DOB: [birth year], 42 y.o.   MRN: [id number]  HPI  42 yo here for follow up and to discuss fatigue.    Cluster headaches- still needs to take percocet at night.  She feels like her headaches are a little better.  She is back at work.  Still has a right sided headache several days per week with blurred vision of right eye.  Only thing that helps with pain is percocet.   HTN- On Verapamil which we increased on 12/16 and HCTZ 25 mg daily. BP elevated again today.  Denies any SOB or LE edema. Lab Results  Component Value Date   CREATININE 0.8 04/22/2012   Fatigue- last month she feels "beyond tired."  She is sleeping ok but always feels like she did not sleep the night before.  Denies heat or cold intolerance, no changes in her bowel habits.  Denies any anxiety or depression.     Patient Active Problem List  Diagnosis  . HYPERLIPIDEMIA  . ADJUSTMENT DISORDER WITH MIXED FEATURES  . ESSENTIAL HYPERTENSION, BENIGN  . HYPERTENSION  . TMJ PAIN  . Cluster headache  . Accessory skin tags  . Lumbar back pain  . Other malaise and fatigue   Past Medical History  Diagnosis Date  . Acute serous otitis media   . Adjustment disorder with mixed anxiety and depressed mood   . Essential hypertension, benign   . Other and unspecified hyperlipidemia   . Unspecified essential hypertension   . Arthralgia of temporomandibular joint    Past Surgical History  Procedure Laterality Date  . Cholecystectomy    . Tonsillectomy    . Cesarean section     History  Substance Use Topics  . Smoking status: Current Every Day Smoker  . Smokeless tobacco: Not on file  . Alcohol Use: No   Family History  Problem Relation Age of Onset  . Diabetes Mother   . Diabetes Father   . Alcohol abuse Father    No Known Allergies Current Outpatient Prescriptions on File Prior to Visit  Medication Sig Dispense Refill  . hydrochlorothiazide (HYDRODIURIL)  25 MG tablet Take 0.5 tablets (12.5 mg total) by mouth daily.  90 tablet  3  . oxyCODONE-acetaminophen (PERCOCET) 7.5-325 MG per tablet Take 1 tablet by mouth every 4 (four) hours as needed for pain.  60 tablet  0  . simvastatin (ZOCOR) 20 MG tablet TAKE 1 TABLET (20 MG TOTAL) BY MOUTH EVERY EVENING.  90 tablet  1  . verapamil (VERELAN PM) 360 MG 24 hr capsule Take 1 capsule (360 mg total) by mouth at bedtime.  30 capsule  6   No current facility-administered medications on file prior to visit.   The PMH, PSH, Social History, Family History, Medications, and allergies have been reviewed in [facility], and have been updated if relevant.   Review of Systems  No CP, No SOB. No UE or LE weakness.    Objective:   Physical Exam BP 160/100  Pulse 84  Temp(Src) 98 F (36.7 C)  Wt 231 lb (104.781 kg)  BMI 36.17 kg/m2  General:  Well-developed,well-nourished,in no acute distress; alert,appropriate and cooperative throughout examination Head:  normocephalic and atraumatic.   Eyes:  vision grossly intact, pupils equal, pupils round, and pupils reactive to light.   Ears:  R ear normal and L ear normal.   Nose:  no external deformity.   Mouth:  good dentition.  Neck:  No deformities, masses, or tenderness noted. Lungs:  Normal respiratory effort, chest expands symmetrically. Lungs are clear to auscultation, no crackles or wheezes. Heart:  Normal rate and regular rhythm. S1 and S2 normal without gallop, murmur, click, rub or other extra sounds. Extremities:  No clubbing, cyanosis, edema, or deformity noted with normal full range of motion of all joints.   Neurologic:  alert & oriented X3 and gait normal.   Skin:  Intact without suspicious lesions or rashes Psych:  Cognition and judgment appear intact. Alert and cooperative with normal attention span and concentration. No apparent delusions, illusions, hallucinations    Assessment & Plan:  1. HYPERTENSION Deteriorated.  Add Enalapril 20 mg daily to  verapamil and HCTZ. Follow up in 2 weeks. The patient indicates understanding of these issues and agrees with the plan.  - Comprehensive metabolic panel  2. Cluster headache Improved. - Comprehensive metabolic panel  3. Other malaise and fatigue Likely multifactorial - elevated blood pressure likely playing a roll. Will check labs today to rule out other possible contributing factors. The patient indicates understanding of these issues and agrees with the plan.  - TSH - T4, Free - Vitamin D 25 hydroxy - Vitamin B12 - CBC with Differential - Comprehensive metabolic panel

## 2013-04-02 NOTE — Patient Instructions (Addendum)
Good to see you. We are adding back enalapril to your verapamil and your HCTZ.  We will call you with your lab results.

## 2013-04-03 ENCOUNTER — Other Ambulatory Visit: Payer: Self-pay | Admitting: Family Medicine

## 2013-04-03 DIAGNOSIS — R7989 Other specified abnormal findings of blood chemistry: Secondary | ICD-10-CM

## 2013-04-03 LAB — VITAMIN D 25 HYDROXY (VIT D DEFICIENCY, FRACTURES): Vit D, 25-Hydroxy: 18 ng/mL — ABNORMAL LOW (ref 30–89)

## 2013-04-03 MED ORDER — ERGOCALCIFEROL 1.25 MG (50000 UT) PO CAPS: 50000.0000 [IU] | ORAL_CAPSULE | ORAL | Status: DC

## 2013-04-03 NOTE — Addendum Note (Signed)
Addended by: Eliezer Bottom on: 04/03/2013 10:47 AM   Modules accepted: Orders

## 2013-04-08 ENCOUNTER — Telehealth: Payer: Self-pay

## 2013-04-08 NOTE — Telephone Encounter (Signed)
Pt wanted Dr Elmer Sow CMA to contact pt prior to sending in short term disability paper work. pts work should fax form this week.

## 2013-04-09 NOTE — Telephone Encounter (Signed)
Per Dr. Dayton Martes, advised patient she needs office visit to complete, appt scheduled for 04/13/13.

## 2013-04-13 ENCOUNTER — Ambulatory Visit (INDEPENDENT_AMBULATORY_CARE_PROVIDER_SITE_OTHER): Payer: Managed Care, Other (non HMO) | Admitting: Family Medicine

## 2013-04-13 ENCOUNTER — Encounter: Payer: Self-pay | Admitting: Family Medicine

## 2013-04-13 VITALS — BP 124/70 | HR 72 | Temp 98.1°F | Wt 228.0 lb

## 2013-04-13 DIAGNOSIS — G44009 Cluster headache syndrome, unspecified, not intractable: Secondary | ICD-10-CM

## 2013-04-13 DIAGNOSIS — I10 Essential (primary) hypertension: Secondary | ICD-10-CM

## 2013-04-13 DIAGNOSIS — R7989 Other specified abnormal findings of blood chemistry: Secondary | ICD-10-CM | POA: Insufficient documentation

## 2013-04-13 NOTE — Progress Notes (Signed)
Subjective:    Patient ID: Erika [name], female    DOB: [birth year], 42 y.o.   MRN: [id number]  HPI  42 yo here for follow up and to fill out FMLA paperwork again.  I saw her last week for fatigue.  Vit B12 and CMET unremarkable. Vit D 18- started on high dose Vit D- 50,000 IU weekly. CBC also remains abnormal (chronic issue)- I did refer her to hematology since she is now symptomatic and has a family history of myelodysplasia.  HTN has also been fluctuating which I suspect is adding to her fatigue.    Cluster headaches- still needs to take percocet at night.  She feels like her headaches are a little better.  She is back at work.  Still has a right sided headache several days per week with blurred vision of right eye.  Only thing that helps with pain is percocet.   HTN- On Verapamil which we increased on 12/16 and HCTZ 25 mg daily.  We added Enalapril 20 mg daily last week.   BP is much improved today- normotensive!   Denies any SOB or LE edema. Lab Results  Component Value Date   CREATININE 0.8 04/02/2013      Patient Active Problem List  Diagnosis  . HYPERLIPIDEMIA  . ADJUSTMENT DISORDER WITH MIXED FEATURES  . ESSENTIAL HYPERTENSION, BENIGN  . HYPERTENSION  . TMJ PAIN  . Cluster headache  . Accessory skin tags  . Lumbar back pain  . Other malaise and fatigue   Past Medical History  Diagnosis Date  . Acute serous otitis media   . Adjustment disorder with mixed anxiety and depressed mood   . Essential hypertension, benign   . Other and unspecified hyperlipidemia   . Unspecified essential hypertension   . Arthralgia of temporomandibular joint    Past Surgical History  Procedure Laterality Date  . Cholecystectomy    . Tonsillectomy    . Cesarean section     History  Substance Use Topics  . Smoking status: Current Every Day Smoker  . Smokeless tobacco: Not on file  . Alcohol Use: No   Family History  Problem Relation Age of Onset  . Diabetes Mother   .  Diabetes Father   . Alcohol abuse Father    No Known Allergies Current Outpatient Prescriptions on File Prior to Visit  Medication Sig Dispense Refill  . enalapril (VASOTEC) 20 MG tablet Take 1 tablet (20 mg total) by mouth daily.  30 tablet  3  . ergocalciferol (VITAMIN D2) 50000 UNITS capsule Take 1 capsule (50,000 Units total) by mouth once a week. X 6 weeks  6 capsule  0  . hydrochlorothiazide (HYDRODIURIL) 25 MG tablet Take 0.5 tablets (12.5 mg total) by mouth daily.  90 tablet  3  . oxyCODONE-acetaminophen (PERCOCET) 7.5-325 MG per tablet Take 1 tablet by mouth every 4 (four) hours as needed for pain.  60 tablet  0  . oxyCODONE-acetaminophen (PERCOCET) 7.5-325 MG per tablet Take 1 tablet by mouth every 4 (four) hours as needed for pain.  60 tablet  0  . simvastatin (ZOCOR) 20 MG tablet TAKE 1 TABLET (20 MG TOTAL) BY MOUTH EVERY EVENING.  90 tablet  1  . verapamil (VERELAN PM) 360 MG 24 hr capsule Take 1 capsule (360 mg total) by mouth at bedtime.  30 capsule  6   No current facility-administered medications on file prior to visit.   The PMH, PSH, Social History, Family History, Medications, and allergies have  been reviewed in [facility], and have been updated if relevant.   Review of Systems  No CP, No SOB. No UE or LE weakness. Fatigue remains her biggest complaint    Objective:   Physical Exam BP 124/70  Pulse 72  Temp(Src) 98.1 F (36.7 C)  Wt 228 lb (103.42 kg)  BMI 35.7 kg/m2  General:  Well-developed,well-nourished,in no acute distress; alert,appropriate and cooperative throughout examination Head:  normocephalic and atraumatic.   Eyes:  vision grossly intact, pupils equal, pupils round, and pupils reactive to light.   Ears:  R ear normal and L ear normal.   Nose:  no external deformity.   Mouth:  good dentition.  Neck:  No deformities, masses, or tenderness noted. Lungs:  Normal respiratory effort, chest expands symmetrically. Lungs are clear to auscultation, no  crackles or wheezes. Heart:  Normal rate and regular rhythm. S1 and S2 normal without gallop, murmur, click, rub or other extra sounds. Extremities:  No clubbing, cyanosis, edema, or deformity noted with normal full range of motion of all joints.   Neurologic:  alert & oriented X3 and gait normal.   Skin:  Intact without suspicious lesions or rashes Psych:  Cognition and judgment appear intact. Alert and cooperative with normal attention span and concentration. No apparent delusions, illusions, hallucinations    Assessment & Plan:  1. HYPERTENSION Much improved today! Continue current meds  2. Cluster headache Improved. Continue current meds.  3. Other malaise and fatigue Remains symptomatic. Continue Vit D. Has appt to see Dr. Toma Deiters on Monday (hematolology).  I suspect chronically elevated CBC is due to long term smoking but she does have family history of myelodysplasia. FMLA filled out until May 1st 2014- will follow up after hematology.

## 2013-04-16 ENCOUNTER — Telehealth: Payer: Self-pay

## 2013-04-16 ENCOUNTER — Ambulatory Visit: Payer: Managed Care, Other (non HMO) | Admitting: Family Medicine

## 2013-04-16 NOTE — Telephone Encounter (Signed)
Kim with [employer] disability request clarification of info in 04/02/13 office note. Luanna Cole to fax her request to [phone]. Kim voiced understanding.

## 2013-04-20 ENCOUNTER — Ambulatory Visit: Payer: Self-pay | Admitting: Internal Medicine

## 2013-04-20 LAB — FERRITIN: Ferritin (ARMC): 59 ng/mL (ref 8–388)

## 2013-04-20 LAB — CBC CANCER CENTER
Basophil #: 0.1 x10 3/mm (ref 0.0–0.1)
Basophil %: 0.8 %
Eosinophil #: 0.4 x10 3/mm (ref 0.0–0.7)
HGB: 15.8 g/dL (ref 12.0–16.0)
Lymphocyte #: 3.3 x10 3/mm (ref 1.0–3.6)
MCH: 29.3 pg (ref 26.0–34.0)
MCHC: 33.7 g/dL (ref 32.0–36.0)
MCV: 87 fL (ref 80–100)
Monocyte #: 0.6 x10 3/mm (ref 0.2–0.9)
WBC: 13.9 x10 3/mm — ABNORMAL HIGH (ref 3.6–11.0)

## 2013-04-20 LAB — HEPATIC FUNCTION PANEL A (ARMC)
Albumin: 3.8 g/dL (ref 3.4–5.0)
Alkaline Phosphatase: 102 U/L (ref 50–136)
Bilirubin, Direct: 0.1 mg/dL (ref 0.00–0.20)
SGOT(AST): 46 U/L — ABNORMAL HIGH (ref 15–37)
SGPT (ALT): 101 U/L — ABNORMAL HIGH (ref 12–78)
Total Protein: 8.1 g/dL (ref 6.4–8.2)

## 2013-04-20 LAB — IRON AND TIBC: Unbound Iron-Bind.Cap.: 296 ug/dL

## 2013-04-22 ENCOUNTER — Telehealth: Payer: Self-pay | Admitting: *Deleted

## 2013-04-22 ENCOUNTER — Ambulatory Visit (INDEPENDENT_AMBULATORY_CARE_PROVIDER_SITE_OTHER): Payer: Managed Care, Other (non HMO) | Admitting: Family Medicine

## 2013-04-22 ENCOUNTER — Other Ambulatory Visit: Payer: Self-pay | Admitting: Family Medicine

## 2013-04-22 ENCOUNTER — Encounter: Payer: Self-pay | Admitting: Family Medicine

## 2013-04-22 VITALS — BP 120/72 | HR 84 | Temp 98.4°F | Wt 227.0 lb

## 2013-04-22 DIAGNOSIS — R5383 Other fatigue: Secondary | ICD-10-CM

## 2013-04-22 DIAGNOSIS — R1013 Epigastric pain: Secondary | ICD-10-CM

## 2013-04-22 DIAGNOSIS — I10 Essential (primary) hypertension: Secondary | ICD-10-CM

## 2013-04-22 DIAGNOSIS — R5381 Other malaise: Secondary | ICD-10-CM

## 2013-04-22 DIAGNOSIS — R7989 Other specified abnormal findings of blood chemistry: Secondary | ICD-10-CM

## 2013-04-22 NOTE — Telephone Encounter (Signed)
Note faxed to [employer] disability.

## 2013-04-22 NOTE — Patient Instructions (Addendum)
Good to see you. Please take omeprazole (20 or 40 mg daily) depending on what you have at home x 2 weeks. Call me with an update. Please stop by to see Shirlee Limerick on your way out to set up your referral.

## 2013-04-22 NOTE — Telephone Encounter (Signed)
Message copied by Eliezer Bottom on Wed Apr 22, 2013 12:14 PM ------      Message from: Dianne Dun      Created: Wed Apr 22, 2013  8:05 AM       Please fax office note from today to her disability insurance per pt's request. ------

## 2013-04-22 NOTE — Addendum Note (Signed)
Addended by: Dianne Dun on: 04/22/2013 09:33 AM   Modules accepted: Orders

## 2013-04-22 NOTE — Progress Notes (Addendum)
Subjective:    Patient ID: Erika [name], female    DOB: 1971-09-01, 42 y.o.   MRN: [id number]  HPI  42 yo here for follow up.  I saw her earlier this month for fatigue. Vit B12 and CMET unremarkable. Vit D 18- started on high dose Vit D- 50,000 IU weekly. CBC also remains abnormal (chronic issue)- I did refer her to hematology (Dr. Toma Deiters) since she is now symptomatic and has a family history of myelodysplasia.  HTN has also been fluctuating which I suspect is adding to her fatigue.   I have not yet received records from Dr. Toma Deiters but per pt, he recommended that she a vascular surgeon because her BP was elevated and she had "hard veins in left arm."  Cluster headaches- still needs to take percocet at night.  She feels like her headaches are a little better.    Still has a right sided headache several days per week with blurred vision of right eye.  Only thing that helps with pain is percocet.   HTN- On Verapamil which we increased on 12/16 and HCTZ 25 mg daily.  We added Enalapril 20 mg daily two weeks ago.   BP is much improved today and at last office visit- normotensive!   Denies any SOB or LE edema. Lab Results  Component Value Date   CREATININE 0.8 04/02/2013   Epigastric pain- past two weeks, TTP over epigastrium, particularly at night.  Remote h/o cholecystectomy.  No increased burping, nausea vomiting or changes in bowel habits.  Patient Active Problem List   Diagnosis Date Noted  . Abnormal CBC 04/13/2013  . Other malaise and fatigue 04/02/2013  . Lumbar back pain 02/13/2013  . Accessory skin tags 04/03/2012  . Cluster headache 03/20/2012  . TMJ PAIN 09/30/2010  . HYPERLIPIDEMIA 07/26/2010  . ADJUSTMENT DISORDER WITH MIXED FEATURES 07/26/2010  . ESSENTIAL HYPERTENSION, BENIGN 07/26/2010  . HYPERTENSION 07/26/2010   Past Medical History  Diagnosis Date  . Acute serous otitis media   . Adjustment disorder with mixed anxiety and depressed mood   . Essential  hypertension, benign   . Other and unspecified hyperlipidemia   . Unspecified essential hypertension   . Arthralgia of temporomandibular joint    Past Surgical History  Procedure Laterality Date  . Cholecystectomy    . Tonsillectomy    . Cesarean section     History  Substance Use Topics  . Smoking status: Current Every Day Smoker  . Smokeless tobacco: Not on file  . Alcohol Use: No   Family History  Problem Relation Age of Onset  . Diabetes Mother   . Diabetes Father   . Alcohol abuse Father    No Known Allergies Current Outpatient Prescriptions on File Prior to Visit  Medication Sig Dispense Refill  . enalapril (VASOTEC) 20 MG tablet Take 1 tablet (20 mg total) by mouth daily.  30 tablet  3  . ergocalciferol (VITAMIN D2) 50000 UNITS capsule Take 1 capsule (50,000 Units total) by mouth once a week. X 6 weeks  6 capsule  0  . hydrochlorothiazide (HYDRODIURIL) 25 MG tablet Take 0.5 tablets (12.5 mg total) by mouth daily.  90 tablet  3  . oxyCODONE-acetaminophen (PERCOCET) 7.5-325 MG per tablet Take 1 tablet by mouth every 4 (four) hours as needed for pain.  60 tablet  0  . simvastatin (ZOCOR) 20 MG tablet TAKE 1 TABLET (20 MG TOTAL) BY MOUTH EVERY EVENING.  90 tablet  1  . verapamil (VERELAN PM)  360 MG 24 hr capsule Take 1 capsule (360 mg total) by mouth at bedtime.  30 capsule  6   No current facility-administered medications on file prior to visit.   The PMH, PSH, Social History, Family History, Medications, and allergies have been reviewed in [facility], and have been updated if relevant.   Review of Systems  No CP, No SOB. No UE or LE weakness. No dark stools Fatigue remains her biggest complaint     Objective:   Physical Exam BP 120/72  Pulse 84  Temp(Src) 98.4 F (36.9 C)  Wt 227 lb (102.967 kg)  BMI 35.55 kg/m2  General:  Well-developed,well-nourished,in no acute distress; alert,appropriate and cooperative throughout examination Head:  normocephalic and  atraumatic.   Eyes:  vision grossly intact, pupils equal, pupils round, and pupils reactive to light.   Ears:  R ear normal and L ear normal.   Nose:  no external deformity.   Mouth:  good dentition.  Neck:  No deformities, masses, or tenderness noted. Lungs:  Normal respiratory effort, chest expands symmetrically. Lungs are clear to auscultation, no crackles or wheezes. Heart:  Normal rate and regular rhythm. S1 and S2 normal without gallop, murmur, click, rub or other extra sounds. Extremities:  No clubbing, cyanosis, edema, or deformity noted with normal full range of motion of all joints.   Normal radial pulses bilaterally Neurologic:  alert & oriented X3 and gait normal.   Skin:  Intact without suspicious lesions or rashes Psych:  Cognition and judgment appear intact. Alert and cooperative with normal attention span and concentration. No apparent delusions, illusions, hallucinations    Assessment & Plan:  1. HYPERTENSION Much improved today but did have some elevated readings at hematology again.  Given long term, difficult to control HTN, will order Renal artery [name]. Continue current meds  2. Cluster headache Improved. Continue current meds.  3. Other malaise and fatigue Remains symptomatic. Continue Vit D.   I suspect chronically elevated CBC is due to long term smoking but she does have family history of myelodysplasia. Also need to rule out polycythema. Awaiting records from Dr. Toma Deiters.  4. Abdominal pain, epigastric I suspect GERD vs PUD.  Will start omeprazole 20 mg daily x 2 weeks.  She will follow up in 2 weeks.  5. Abnormal CBC Awaiting records from Dr. Toma Deiters.  Will refer to vascular per pt's report of his request.  Continue out of work until 05/02/2013.

## 2013-04-23 ENCOUNTER — Other Ambulatory Visit: Payer: Self-pay | Admitting: Family Medicine

## 2013-04-23 ENCOUNTER — Ambulatory Visit: Payer: Self-pay | Admitting: Internal Medicine

## 2013-04-23 ENCOUNTER — Telehealth: Payer: Self-pay | Admitting: Family Medicine

## 2013-04-23 MED ORDER — OXYCODONE-ACETAMINOPHEN 7.5-325 MG PO TABS: 1.0000 | ORAL_TABLET | ORAL | Status: DC | PRN

## 2013-04-23 NOTE — Telephone Encounter (Signed)
Pt left note requesting rx oxycodone apap. Call when ready for pick up.

## 2013-04-23 NOTE — Telephone Encounter (Signed)
Erika [name] dropped off FMLA forms to be filled out

## 2013-04-23 NOTE — Telephone Encounter (Signed)
Forms completed and faxed back to [employer].

## 2013-04-23 NOTE — Telephone Encounter (Signed)
Left message advising patient script is ready for pick up. 

## 2013-04-27 ENCOUNTER — Other Ambulatory Visit: Payer: Self-pay | Admitting: Family Medicine

## 2013-04-27 ENCOUNTER — Telehealth: Payer: Self-pay | Admitting: *Deleted

## 2013-04-27 DIAGNOSIS — I998 Other disorder of circulatory system: Secondary | ICD-10-CM

## 2013-04-27 NOTE — Telephone Encounter (Signed)
Forms for disability are on your desk.  I filled out what I could.  The top sheet is a copy of previous form.

## 2013-04-28 NOTE — Telephone Encounter (Signed)
Completed form faxed back.

## 2013-04-28 NOTE — Telephone Encounter (Signed)
Thank you.  Form signed and on my desk.

## 2013-05-11 ENCOUNTER — Telehealth: Payer: Self-pay

## 2013-05-11 DIAGNOSIS — I10 Essential (primary) hypertension: Secondary | ICD-10-CM

## 2013-05-11 NOTE — Telephone Encounter (Signed)
Her Cr is normal.  Why are we referring to [facility]?

## 2013-05-11 NOTE — Telephone Encounter (Signed)
Referral placed.

## 2013-05-11 NOTE — Telephone Encounter (Signed)
Pt left vm; pt was seen by [facility] and recommended pt to go to [facility]( pt in [facility] office now). Pt also needs referral for sleep study. Call pt back.

## 2013-05-11 NOTE — Telephone Encounter (Signed)
Spoke with patient.  She says the vascular doctor wanted her to see nephrologist for her malignant hypertension.  She has already seen nephrologist, so she doesn't need referral.  But she does need referral for sleep study- says nephrologist, hematologist, and vascular doctor have all told her she needs the study.  Prefers to go to [place] for the study.  She wants the study done this week, because nephrologist has written her out of work for this week- do to her BP being high.

## 2013-05-12 ENCOUNTER — Other Ambulatory Visit: Payer: Self-pay | Admitting: Family Medicine

## 2013-05-20 ENCOUNTER — Other Ambulatory Visit: Payer: Self-pay | Admitting: *Deleted

## 2013-05-20 MED ORDER — OXYCODONE-ACETAMINOPHEN 7.5-325 MG PO TABS: 1.0000 | ORAL_TABLET | ORAL | Status: DC | PRN

## 2013-05-24 ENCOUNTER — Ambulatory Visit: Payer: Self-pay | Admitting: Family Medicine

## 2013-05-24 ENCOUNTER — Ambulatory Visit: Payer: Self-pay | Admitting: Internal Medicine

## 2013-05-26 ENCOUNTER — Telehealth: Payer: Self-pay | Admitting: *Deleted

## 2013-05-26 NOTE — Telephone Encounter (Signed)
Disability form is on your desk for completion.

## 2013-05-27 ENCOUNTER — Institutional Professional Consult (permissible substitution): Payer: Managed Care, Other (non HMO) | Admitting: Pulmonary Disease

## 2013-05-28 NOTE — Telephone Encounter (Signed)
Signed and on my desk 

## 2013-05-28 NOTE — Telephone Encounter (Signed)
Form faxed to [employer]

## 2013-06-02 ENCOUNTER — Telehealth: Payer: Self-pay | Admitting: *Deleted

## 2013-06-02 NOTE — Telephone Encounter (Signed)
Erika [name] has faxed a form requesting more information for pt's disability.  They are asking for out of work dates, office notes, outline of treatment plan and if any restrictions or limitations.  Please advise as to what I should send them.

## 2013-06-02 NOTE — Telephone Encounter (Signed)
Advised patient as instructed.  She says she has given [employer] this information, her kidney doctor will take care of it.

## 2013-06-02 NOTE — Telephone Encounter (Signed)
She likely needs to get this done through her kidney doctor since he added several medications and outlined treatment plan per pt.

## 2013-06-15 ENCOUNTER — Other Ambulatory Visit: Payer: Self-pay

## 2013-06-15 MED ORDER — OXYCODONE-ACETAMINOPHEN 7.5-325 MG PO TABS: 1.0000 | ORAL_TABLET | ORAL | Status: DC | PRN

## 2013-06-15 NOTE — Telephone Encounter (Signed)
pt left v/m requesting rx oxycodone apap. Call when ready for pick up. Pt request to pick up on 06/16/13.

## 2013-06-16 ENCOUNTER — Encounter: Payer: Self-pay | Admitting: Family Medicine

## 2013-06-16 MED ORDER — OXYCODONE-ACETAMINOPHEN 7.5-325 MG PO TABS: 1.0000 | ORAL_TABLET | ORAL | Status: DC | PRN

## 2013-06-16 NOTE — Telephone Encounter (Signed)
Advised patient script is ready for pick up, script placed at front desk. 

## 2013-06-23 ENCOUNTER — Ambulatory Visit: Payer: Self-pay | Admitting: Internal Medicine

## 2013-07-17 ENCOUNTER — Encounter: Payer: Self-pay | Admitting: Family Medicine

## 2013-10-16 ENCOUNTER — Ambulatory Visit: Payer: Managed Care, Other (non HMO) | Admitting: Family Medicine

## 2013-10-20 ENCOUNTER — Ambulatory Visit (INDEPENDENT_AMBULATORY_CARE_PROVIDER_SITE_OTHER): Payer: Self-pay | Admitting: Family Medicine

## 2013-10-20 ENCOUNTER — Encounter: Payer: Self-pay | Admitting: Family Medicine

## 2013-10-20 VITALS — BP 220/124 | HR 88 | Temp 98.7°F | Wt 224.5 lb

## 2013-10-20 DIAGNOSIS — I10 Essential (primary) hypertension: Secondary | ICD-10-CM | POA: Insufficient documentation

## 2013-10-20 DIAGNOSIS — G44009 Cluster headache syndrome, unspecified, not intractable: Secondary | ICD-10-CM

## 2013-10-20 HISTORY — DX: Essential (primary) hypertension: I10

## 2013-10-20 MED ORDER — SIMVASTATIN 20 MG PO TABS: ORAL_TABLET | ORAL | Status: DC

## 2013-10-20 MED ORDER — OXYCODONE-ACETAMINOPHEN 7.5-325 MG PO TABS: 1.0000 | ORAL_TABLET | ORAL | Status: DC | PRN

## 2013-10-20 MED ORDER — HYDRALAZINE HCL 25 MG PO TABS: 25.0000 mg | ORAL_TABLET | Freq: Three times a day (TID) | ORAL | Status: DC

## 2013-10-20 MED ORDER — VERAPAMIL HCL 120 MG PO TABS: 120.0000 mg | ORAL_TABLET | Freq: Three times a day (TID) | ORAL | Status: DC

## 2013-10-20 MED ORDER — ENALAPRIL MALEATE 20 MG PO TABS: 20.0000 mg | ORAL_TABLET | Freq: Every day | ORAL | Status: DC

## 2013-10-20 MED ORDER — HYDROCHLOROTHIAZIDE 25 MG PO TABS: ORAL_TABLET | ORAL | Status: DC

## 2013-10-20 MED ORDER — AMLODIPINE BESYLATE 10 MG PO TABS: 10.0000 mg | ORAL_TABLET | Freq: Every day | ORAL | Status: DC

## 2013-10-20 NOTE — Assessment & Plan Note (Signed)
refilled percocet #30 today.

## 2013-10-20 NOTE — Assessment & Plan Note (Addendum)
Severely elevated today - with hypertensive urgency. Pt has been off meds for last 2 weeks. We will restart - advised restart meds slowly - start hctz, acei, and CCB today, then in 4 days start hydralazine. To return in 1 mo for f/u.  Pt agrees with plan.  Baseline EKG today - NSR rate 90s, normal axis, intervals, hypertrophy, no acute ST/T changes

## 2013-10-20 NOTE — Patient Instructions (Addendum)
Your blood pressure is too high!  We need to restart blood pressure medicine today - I recommend starting enalapril 20mg  daily, hydrochlorothiazide 25mg  daily, and verapamil 120mg  three times a day (this should help cluster headaches).  After 3-4 days may restart amlodipine and hydralazine 25mg  three times daily (assuming blood pressure doesn't drop too low to low 100s). Buy blood pressure cuff and monitor blood pressure at home, to ensure not too high OR too low. Return to see [name] in 1 month for blood pressure follow up.

## 2013-10-20 NOTE — Progress Notes (Signed)
  Subjective:    Patient ID: Erika [name], female    DOB: 12/25/1970, 42 y.o.   MRN: [id number]  HPI CC: med refill  Out of meds for last 2 weeks.  Out of percocet for the last month.  Uses this for cluster headaches.  Requests refill of all meds printed to take to [facility].  Vasectomy is birth control  HTN - h/o difficult to control BP s/p evaluation for secondary causes unrevealing.  Saw Dr. Cherylann Ratel and vascular doctor for renal doppler.  Intermittent headache. No vision changes, CP/tightness, SOB, leg swelling.  Some RLS type symptoms worse at night.  No EKG in past.  Past Medical History  Diagnosis Date  . Acute serous otitis media   . Adjustment disorder with mixed anxiety and depressed mood   . Essential hypertension, benign   . Other and unspecified hyperlipidemia   . Unspecified essential hypertension   . Arthralgia of temporomandibular joint      Review of Systems Per HPI    Objective:   Physical Exam  Constitutional: She appears well-developed and well-nourished. No distress.  HENT:  Head: Normocephalic and atraumatic.  Mouth/Throat: Oropharynx is clear and moist. No oropharyngeal exudate.  Eyes: Conjunctivae and EOM are normal. Pupils are equal, round, and reactive to light. No scleral icterus.  Neck: Normal range of motion. Neck supple. Carotid bruit is not present.  Cardiovascular: Normal rate, regular rhythm, normal heart sounds and intact distal pulses.   No murmur heard. Pulmonary/Chest: Effort normal and breath sounds normal. No respiratory distress. She has no wheezes. She has no rales.  Abdominal: Soft. Bowel sounds are normal. There is no tenderness.  No abd/renal bruit  Musculoskeletal: She exhibits no edema.  Lymphadenopathy:    She has no cervical adenopathy.  Skin: Skin is warm and dry. No rash noted.       Assessment & Plan:

## 2013-11-27 ENCOUNTER — Emergency Department: Payer: Self-pay | Admitting: Emergency Medicine

## 2013-11-27 LAB — CBC
HCT: 43.4 % (ref 35.0–47.0)
MCV: 87 fL (ref 80–100)
Platelet: 366 10*3/uL (ref 150–440)

## 2013-11-27 LAB — COMPREHENSIVE METABOLIC PANEL
Albumin: 3.4 g/dL (ref 3.4–5.0)
Anion Gap: 5 — ABNORMAL LOW (ref 7–16)
BUN: 12 mg/dL (ref 7–18)
Bilirubin,Total: 0.2 mg/dL (ref 0.2–1.0)
EGFR (African American): >60
EGFR (Non-African Amer.): >60
Glucose: 153 mg/dL — ABNORMAL HIGH (ref 65–99)
Potassium: 3.8 mmol/L (ref 3.5–5.1)
SGOT(AST): 29 U/L (ref 15–37)
SGPT (ALT): 33 U/L (ref 12–78)
Sodium: 138 mmol/L (ref 136–145)
Total Protein: 7.3 g/dL (ref 6.4–8.2)

## 2013-11-27 LAB — TROPONIN I: Troponin-I: <0.02 ng/mL

## 2014-01-11 ENCOUNTER — Emergency Department: Payer: Self-pay | Admitting: Emergency Medicine

## 2014-01-11 LAB — CBC WITH DIFFERENTIAL/PLATELET
Basophil #: 0.1 10*3/uL (ref 0.0–0.1)
Basophil %: 1.2 %
EOS PCT: 2.9 %
Eosinophil #: 0.3 10*3/uL (ref 0.0–0.7)
HCT: 46.9 % (ref 35.0–47.0)
HGB: 15.8 g/dL (ref 12.0–16.0)
LYMPHS PCT: 29.7 %
Lymphocyte #: 3.4 10*3/uL (ref 1.0–3.6)
MCH: 29.6 pg (ref 26.0–34.0)
MCHC: 33.8 g/dL (ref 32.0–36.0)
MCV: 88 fL (ref 80–100)
Monocyte #: 0.7 x10 3/mm (ref 0.2–0.9)
Monocyte %: 5.9 %
Neutrophil #: 6.8 10*3/uL — ABNORMAL HIGH (ref 1.4–6.5)
Neutrophil %: 60.3 %
Platelet: 375 10*3/uL (ref 150–440)
RBC: 5.36 10*6/uL — ABNORMAL HIGH (ref 3.80–5.20)
RDW: 13.8 % (ref 11.5–14.5)
WBC: 11.3 10*3/uL — AB (ref 3.6–11.0)

## 2014-01-11 LAB — BASIC METABOLIC PANEL
Anion Gap: 4 — ABNORMAL LOW (ref 7–16)
BUN: 10 mg/dL (ref 7–18)
CALCIUM: 10.3 mg/dL — AB (ref 8.5–10.1)
CO2: 28 mmol/L (ref 21–32)
CREATININE: 0.67 mg/dL (ref 0.60–1.30)
Chloride: 104 mmol/L (ref 98–107)
EGFR (Non-African Amer.): >60
Glucose: 110 mg/dL — ABNORMAL HIGH (ref 65–99)
OSMOLALITY: 272 (ref 275–301)
Potassium: 4 mmol/L (ref 3.5–5.1)
SODIUM: 136 mmol/L (ref 136–145)

## 2014-01-11 LAB — TROPONIN I

## 2014-07-27 ENCOUNTER — Encounter: Payer: Self-pay | Admitting: Family Medicine

## 2014-07-27 ENCOUNTER — Ambulatory Visit (INDEPENDENT_AMBULATORY_CARE_PROVIDER_SITE_OTHER): Payer: Self-pay | Admitting: Family Medicine

## 2014-07-27 VITALS — BP 180/92 | HR 84 | Temp 98.5°F | Ht 66.25 in | Wt 230.5 lb

## 2014-07-27 DIAGNOSIS — F4323 Adjustment disorder with mixed anxiety and depressed mood: Secondary | ICD-10-CM

## 2014-07-27 DIAGNOSIS — I10 Essential (primary) hypertension: Secondary | ICD-10-CM

## 2014-07-27 MED ORDER — ENALAPRIL MALEATE 20 MG PO TABS: 20.0000 mg | ORAL_TABLET | Freq: Every day | ORAL | Status: DC

## 2014-07-27 MED ORDER — ALPRAZOLAM 0.25 MG PO TABS: 0.2500 mg | ORAL_TABLET | Freq: Three times a day (TID) | ORAL | Status: DC | PRN

## 2014-07-27 MED ORDER — SIMVASTATIN 20 MG PO TABS: ORAL_TABLET | ORAL | Status: DC

## 2014-07-27 MED ORDER — HYDROCHLOROTHIAZIDE 25 MG PO TABS: ORAL_TABLET | ORAL | Status: DC

## 2014-07-27 MED ORDER — SERTRALINE HCL 25 MG PO TABS: 25.0000 mg | ORAL_TABLET | Freq: Every day | ORAL | Status: DC

## 2014-07-27 MED ORDER — VERAPAMIL HCL 120 MG PO TABS: 120.0000 mg | ORAL_TABLET | Freq: Three times a day (TID) | ORAL | Status: DC

## 2014-07-27 NOTE — Patient Instructions (Signed)
It was great to see you. I am so sorry for your loss.  We are starting Zoloft 25 mg daily.  Take xanax as needed for anxiety, insomnia.  Follow up with me in 2 weeks.

## 2014-07-27 NOTE — Assessment & Plan Note (Signed)
Appropriate grief reaction. Worsening her BP. Discussed tx options- she would like to defer psychotherapy at this time. Start zoloft 25 mg daily with prn xanax- discussed sedation and addiction potential. Follow up in 2 weeks.

## 2014-07-27 NOTE — Assessment & Plan Note (Signed)
Deteriorated. Asymptomatic. Has not had all of her medications- Meds refilled. Worsened by anxiety. Follow up in 2 weeks.

## 2014-07-27 NOTE — Progress Notes (Signed)
Subjective:   Patient ID: Erika [name], female    DOB: 01/15/1971, 43 y.o.   MRN: [id number]  Erika [name] is a pleasant 43 y.o. year old female who presents to clinic today with Follow-up and Anxiety  on 07/27/2014  HPI: HTN- was last seen in office by Dr. Darnell Level on 10/28 for HTN urgency while I was on maternity leave. Note reviewed-had been out of meds at that time.  Has had a work up for secondary causes which was unrevealing- neg renal dopplers.  Has been under more stress- had to put her dog down and then her nephew passed away during the same week- 1st week in July.  Not sleeping well.  More tearful.  Denies SI or HI.  Has had more joint pain.  BP very elevated.  Just took some of her medication prior to coming in today- ran out of the others. No blurred vision.  No CP or SOB.  Taking Enalapril 20 mg daily, Hydralazine 25 mg three times daily, HCTZ 25 mg daily and Verapamil 120 mg three times daily.  Current Outpatient Prescriptions on File Prior to Visit  Medication Sig Dispense Refill  . ergocalciferol (VITAMIN D2) 50000 UNITS capsule Take 1 capsule (50,000 Units total) by mouth once a week. X 6 weeks  6 capsule  0  . hydrALAZINE (APRESOLINE) 25 MG tablet Take 1 tablet (25 mg total) by mouth 3 (three) times daily.  270 tablet  3   No current facility-administered medications on file prior to visit.    No Known Allergies  Past Medical History  Diagnosis Date  . Acute serous otitis media   . Adjustment disorder with mixed anxiety and depressed mood   . Essential hypertension, benign   . Other and unspecified hyperlipidemia   . Unspecified essential hypertension   . Arthralgia of temporomandibular joint     Past Surgical History  Procedure Laterality Date  . Cholecystectomy    . Tonsillectomy    . Cesarean section      Family History  Problem Relation Age of Onset  . Diabetes Mother   . Diabetes Father   . Alcohol abuse Father     History   Social  History  . Marital Status: Married    Spouse Name: [name]    Number of Children: [name]  . Years of Education: [name]   Occupational History  . Not on file.   Social History Main Topics  . Smoking status: Current Every Day Smoker -- 1.00 packs/day    Types: Cigarettes  . Smokeless tobacco: Not on file  . Alcohol Use: No  . Drug Use: No  . Sexual Activity: Not on file   Other Topics Concern  . Not on file   Social History Narrative  . No narrative on file   The PMH, PSH, Social History, Family History, Medications, and allergies have been reviewed in [facility], and have been updated if relevant.   Review of Systems See HPI  No HA No blurred vision No CP or SOB Difficulty concentrating Appetite ok- Wt Readings from Last 3 Encounters:  07/27/14 230 lb 8 oz (104.554 kg)  10/20/13 224 lb 8 oz (101.833 kg)  04/22/13 227 lb (102.967 kg)       Objective:    BP 230/130  Pulse 84  Temp(Src) 98.5 F (36.9 C) (Oral)  Ht 5' 6.25" (1.683 m)  Wt 230 lb 8 oz (104.554 kg)  BMI 36.91 kg/m2  SpO2 96%  LMP 07/05/2014  Physical Exam  Nursing note and vitals reviewed. Constitutional: She is oriented to person, place, and time. She appears well-developed and well-nourished. No distress.  HENT:  Head: Normocephalic.  Cardiovascular: Normal rate and regular rhythm.   Neurological: She is alert and oriented to person, place, and time. No cranial nerve deficit. Coordination normal.  Skin: Skin is warm and dry.  Psychiatric:  Tearful, good eye contact          Assessment & Plan:   HTN (hypertension), malignant  ADJUSTMENT DISORDER WITH MIXED FEATURES Return in about 2 weeks (around 08/10/2014) for medication follow up.Marland Kitchen

## 2014-07-27 NOTE — Progress Notes (Signed)
Pre visit review using our clinic review tool, if applicable. No additional management support is needed unless otherwise documented below in the visit note. 

## 2014-07-28 ENCOUNTER — Telehealth: Payer: Self-pay | Admitting: Family Medicine

## 2014-07-28 NOTE — Telephone Encounter (Signed)
Relevant patient education assigned to patient using Emmi. ° °

## 2014-08-10 ENCOUNTER — Telehealth: Payer: Self-pay | Admitting: Family Medicine

## 2014-08-10 ENCOUNTER — Ambulatory Visit (INDEPENDENT_AMBULATORY_CARE_PROVIDER_SITE_OTHER): Payer: Self-pay | Admitting: Family Medicine

## 2014-08-10 ENCOUNTER — Encounter: Payer: Self-pay | Admitting: Family Medicine

## 2014-08-10 ENCOUNTER — Emergency Department: Payer: Self-pay | Admitting: Emergency Medicine

## 2014-08-10 VITALS — BP 160/90 | HR 85 | Temp 97.7°F | Wt 224.0 lb

## 2014-08-10 DIAGNOSIS — F4323 Adjustment disorder with mixed anxiety and depressed mood: Secondary | ICD-10-CM

## 2014-08-10 DIAGNOSIS — I10 Essential (primary) hypertension: Secondary | ICD-10-CM

## 2014-08-10 LAB — COMPREHENSIVE METABOLIC PANEL
ALBUMIN: 3.6 g/dL (ref 3.4–5.0)
ALK PHOS: 78 U/L
ALT: 83 U/L — AB
ANION GAP: 12 (ref 7–16)
AST: 68 U/L — AB (ref 15–37)
BUN: 13 mg/dL (ref 7–18)
Bilirubin,Total: 0.4 mg/dL (ref 0.2–1.0)
CHLORIDE: 96 mmol/L — AB (ref 98–107)
CO2: 27 mmol/L (ref 21–32)
Calcium, Total: 9.9 mg/dL (ref 8.5–10.1)
Creatinine: 1.56 mg/dL — ABNORMAL HIGH (ref 0.60–1.30)
EGFR (African American): 48 — ABNORMAL LOW
EGFR (Non-African Amer.): 41 — ABNORMAL LOW
GLUCOSE: 111 mg/dL — AB (ref 65–99)
Osmolality: 271 (ref 275–301)
Potassium: 3.5 mmol/L (ref 3.5–5.1)
Sodium: 135 mmol/L — ABNORMAL LOW (ref 136–145)
Total Protein: 8.3 g/dL — ABNORMAL HIGH (ref 6.4–8.2)

## 2014-08-10 LAB — URINALYSIS, COMPLETE
Bilirubin,UR: NEGATIVE
Blood: NEGATIVE
Glucose,UR: NEGATIVE mg/dL (ref 0–75)
Hyaline Cast: 2
Ketone: NEGATIVE
LEUKOCYTE ESTERASE: NEGATIVE
NITRITE: NEGATIVE
Ph: 6 (ref 4.5–8.0)
Specific Gravity: 1.01 (ref 1.003–1.030)
Squamous Epithelial: 9
WBC UR: 11 /HPF (ref 0–5)

## 2014-08-10 LAB — CBC
HCT: 48.8 % — AB (ref 35.0–47.0)
HGB: 16.3 g/dL — AB (ref 12.0–16.0)
MCH: 29.2 pg (ref 26.0–34.0)
MCHC: 33.4 g/dL (ref 32.0–36.0)
MCV: 88 fL (ref 80–100)
Platelet: 532 10*3/uL — ABNORMAL HIGH (ref 150–440)
RBC: 5.58 10*6/uL — ABNORMAL HIGH (ref 3.80–5.20)
RDW: 13.2 % (ref 11.5–14.5)
WBC: 17.7 10*3/uL — ABNORMAL HIGH (ref 3.6–11.0)

## 2014-08-10 LAB — TROPONIN I: Troponin-I: <0.02 ng/mL

## 2014-08-10 MED ORDER — CLONAZEPAM 0.5 MG PO TBDP: 0.5000 mg | ORAL_TABLET | Freq: Two times a day (BID) | ORAL | Status: DC | PRN

## 2014-08-10 MED ORDER — SERTRALINE HCL 50 MG PO TABS: 50.0000 mg | ORAL_TABLET | Freq: Every day | ORAL | Status: DC

## 2014-08-10 NOTE — Progress Notes (Signed)
Pre visit review using our clinic review tool, if applicable. No additional management support is needed unless otherwise documented below in the visit note. 

## 2014-08-10 NOTE — Progress Notes (Signed)
Subjective:   Patient ID: Erika [name], female    DOB: Jul 21, 1971, 43 y.o.   MRN: [id number]  Erika [name] is a pleasant 43 y.o. year old female who presents to clinic today with Follow-up  on 08/10/2014  HPI:  Saw her two weeks ago (07/27/14)- at that visit her BP was very elevated but she had run out of some of her medications.  We refilled them and I advised her to follow up with me here today.  Taking Enalapril 20 mg daily, Hydralazine 25 mg three times daily, HCTZ 25 mg daily and Verapamil 120 mg three times daily.  Has not taken her 2nd dose yet today.  Also was very tearful.  Had been under more stress- had to put her dog down and then her nephew passed away during the same week- 1st week in July.  Complained of not sleeping well. Started zoloft 25 mg daily, xanax prn panic attack/insomnia.   She feels she is actually worse.  More tearful.  "I had six panic attacks this morning."  She feels xanax is ineffective.  Denies SI or HI.  Current Outpatient Prescriptions on File Prior to Visit  Medication Sig Dispense Refill  . ALPRAZolam (XANAX) 0.25 MG tablet Take 1 tablet (0.25 mg total) by mouth 3 (three) times daily as needed for anxiety or sleep.  30 tablet  0  . enalapril (VASOTEC) 20 MG tablet Take 1 tablet (20 mg total) by mouth daily.  30 tablet  2  . ergocalciferol (VITAMIN D2) 50000 UNITS capsule Take 1 capsule (50,000 Units total) by mouth once a week. X 6 weeks  6 capsule  0  . hydrALAZINE (APRESOLINE) 25 MG tablet Take 1 tablet (25 mg total) by mouth 3 (three) times daily.  270 tablet  3  . hydrochlorothiazide (HYDRODIURIL) 25 MG tablet TAKE 1 pill daily  30 tablet  2  . sertraline (ZOLOFT) 25 MG tablet Take 1 tablet (25 mg total) by mouth daily.  30 tablet  3  . simvastatin (ZOCOR) 20 MG tablet TAKE 1 TABLET (20 MG TOTAL) BY MOUTH EVERY EVENING.  30 tablet  2  . verapamil (CALAN) 120 MG tablet Take 1 tablet (120 mg total) by mouth 3 (three) times daily.  90  tablet  2   No current facility-administered medications on file prior to visit.    No Known Allergies  Past Medical History  Diagnosis Date  . Acute serous otitis media   . Adjustment disorder with mixed anxiety and depressed mood   . Essential hypertension, benign   . Other and unspecified hyperlipidemia   . Unspecified essential hypertension   . Arthralgia of temporomandibular joint     Past Surgical History  Procedure Laterality Date  . Cholecystectomy    . Tonsillectomy    . Cesarean section      Family History  Problem Relation Age of Onset  . Diabetes Mother   . Diabetes Father   . Alcohol abuse Father     History   Social History  . Marital Status: Married    Spouse Name: [name]    Number of Children: [name]  . Years of Education: [name]   Occupational History  . Not on file.   Social History Main Topics  . Smoking status: Current Every Day Smoker -- 1.00 packs/day    Types: Cigarettes  . Smokeless tobacco: Not on file  . Alcohol Use: No  . Drug Use: No  . Sexual Activity: Not on  file   Other Topics Concern  . Not on file   Social History Narrative  . No narrative on file   The PMH, PSH, Social History, Family History, Medications, and allergies have been reviewed in [facility], and have been updated if relevant.   Review of Systems See HPI No SI or HI No HA No blurred vision No CP or SOB Difficulty concentrating Appetite ok- Wt Readings from Last 3 Encounters:  08/10/14 224 lb (101.606 kg)  07/27/14 230 lb 8 oz (104.554 kg)  10/20/13 224 lb 8 oz (101.833 kg)       Objective:    BP 160/90  Pulse 85  Temp(Src) 97.7 F (36.5 C) (Oral)  Wt 224 lb (101.606 kg)  SpO2 97%  LMP 07/26/2014   Physical Exam  Nursing note and vitals reviewed. Constitutional: She is oriented to person, place, and time. She appears well-developed and well-nourished. No distress.  HENT:  Head: Normocephalic.  Cardiovascular: Normal rate and regular rhythm.     Neurological: She is alert and oriented to person, place, and time. No cranial nerve deficit. Coordination normal.  Skin: Skin is warm and dry.  Psychiatric:  Tearful, good eye contact          Assessment & Plan:   HTN (hypertension), malignant  ADJUSTMENT DISORDER WITH MIXED FEATURES No Follow-up on file.

## 2014-08-10 NOTE — Assessment & Plan Note (Signed)
Improved but remains elevated like due to worsening anxiety. She remains asymptomatic.  No changes made today.

## 2014-08-10 NOTE — Patient Instructions (Signed)
Great to see you. Hang in there. We are increasing your zoloft to 50 mg daily. Stop xanax Start Klonopin as needed. Please stop by to see Rosaria Ferries up front.

## 2014-08-10 NOTE — Assessment & Plan Note (Signed)
Deteriorated. >25 minutes spent in face to face time with patient, >50% spent in counselling or coordination of care. Refer to psychotherapy. Increase Zoloft to 50 mg daily. D/c xanax due to ineffectiveness. Start Klonopin 0.5 mg twice daily prn anxiety.

## 2014-08-10 NOTE — Telephone Encounter (Signed)
Patient Information:  Caller Name: Adelee  Phone: [phone]  Patient: Erika [name], Erika [name]  Gender: Female  DOB: 20-Aug-1971  Age: 43 Years  PCP: Arnette Norris [facility])  Pregnant: No  Office Follow Up:  Does the office need to follow up with this patient?: No  Instructions For The Office: [name]  RN Note:  Called office and spoke to [place] advised that pt go to [facility] or ED.  Symptoms  Reason For Call & Symptoms: Calling about BS of 132, feels dizzy,shaky,sweating,weak,weight loss without trying and more thirsty. Hasn't eaten anything since 1900 last night but had 1 Mt Dew today. Hasn't been dx with diabetes but has a strong family hx. Came home and checked BS because she doesn't feel good, checks BS every now and then and it's usually in the 80's. Was seen by Dr Deborra Medina today for anxiety and meds were changed. Pt feels that her sxs are related to high BS and not anxiety.  Reviewed Health History In EMR: No  Reviewed Medications In EMR: No  Reviewed Allergies In EMR: No  Reviewed Surgeries / Procedures: No  Date of Onset of Symptoms: 08/10/2014 OB / GYN:  LMP: 07/27/2014  Guideline(s) Used:  Diabetes - High Blood Sugar  Disposition Per Guideline:   See Today in Office  Reason For Disposition Reached:   New-onset diabetes suspected (e.g., frequent urination, weak, weight loss)  Advice Given:  [name]  Patient Will Follow Care Advice:  YES

## 2014-08-19 ENCOUNTER — Ambulatory Visit (INDEPENDENT_AMBULATORY_CARE_PROVIDER_SITE_OTHER): Payer: Self-pay | Admitting: Psychology

## 2014-08-19 DIAGNOSIS — F4323 Adjustment disorder with mixed anxiety and depressed mood: Secondary | ICD-10-CM

## 2014-08-20 ENCOUNTER — Telehealth: Payer: Self-pay

## 2014-08-20 MED ORDER — CLONAZEPAM 0.5 MG PO TBDP: 0.5000 mg | ORAL_TABLET | Freq: Two times a day (BID) | ORAL | Status: DC | PRN

## 2014-08-20 NOTE — Telephone Encounter (Signed)
Please refill times one in PCP absence   Px written for call in   

## 2014-08-20 NOTE — Telephone Encounter (Signed)
Pt left v/m requesting cb when med called in .

## 2014-08-20 NOTE — Telephone Encounter (Signed)
Pt notified Rx sent to pharmacy

## 2014-08-20 NOTE — Telephone Encounter (Signed)
Pt left v/m requesting refill clonazepam to [facility] [address]. Pt will be going out of town on 08/23/14 and pt will be out of med on 08/25/14.Please advise. Pt request cb.

## 2014-08-20 NOTE — Telephone Encounter (Signed)
Rx called in as prescribed 

## 2014-08-31 ENCOUNTER — Ambulatory Visit: Payer: Self-pay | Admitting: Family Medicine

## 2014-09-02 ENCOUNTER — Ambulatory Visit (INDEPENDENT_AMBULATORY_CARE_PROVIDER_SITE_OTHER): Payer: Self-pay | Admitting: Psychology

## 2014-09-02 ENCOUNTER — Encounter: Payer: Self-pay | Admitting: Family Medicine

## 2014-09-02 ENCOUNTER — Ambulatory Visit (INDEPENDENT_AMBULATORY_CARE_PROVIDER_SITE_OTHER): Payer: Self-pay | Admitting: Family Medicine

## 2014-09-02 VITALS — BP 158/98 | HR 83 | Temp 97.5°F | Wt 228.0 lb

## 2014-09-02 DIAGNOSIS — I10 Essential (primary) hypertension: Secondary | ICD-10-CM

## 2014-09-02 DIAGNOSIS — F4323 Adjustment disorder with mixed anxiety and depressed mood: Secondary | ICD-10-CM

## 2014-09-02 MED ORDER — CLONAZEPAM 1 MG PO TBDP: 1.0000 mg | ORAL_TABLET | Freq: Two times a day (BID) | ORAL | Status: DC | PRN

## 2014-09-02 NOTE — Assessment & Plan Note (Signed)
Improved. Continue current rx- no changes made today.

## 2014-09-02 NOTE — Assessment & Plan Note (Signed)
>  25 minutes spent in face to face time with patient, >50% spent in counselling or coordination of care Most of her symptoms exacerbated by grief.  Will increase dose of klonopin to 1 mg twice daily prn for short period of time only- pt aware of addiction and sedation potential. Keep appt with [name] today- Erika [name] is very encouraged by her first interaction with [name]. Call or return to clinic prn if these symptoms worsen or fail to improve as anticipated. The patient indicates understanding of these issues and agrees with the plan.

## 2014-09-02 NOTE — Patient Instructions (Signed)
Good to see you. Hang in there.  Keep your appointment with [name].  Call me in a couple of weeks with an update.

## 2014-09-02 NOTE — Progress Notes (Signed)
Pre visit review using our clinic review tool, if applicable. No additional management support is needed unless otherwise documented below in the visit note. 

## 2014-09-02 NOTE — Progress Notes (Signed)
Subjective:   Patient ID: Erika [name], female    DOB: 05-16-1971, 43 y.o.   MRN: [id number]  Erika [name] is a pleasant 43 y.o. year old female who presents to clinic today with Follow-up  on 09/02/2014  HPI:  Saw her two weeks ago (08/10/2014)- at that visit her BP was very elevated but she had run out of some of her medications.    HTN- Taking Enalapril 20 mg daily, Hydralazine 25 mg three times daily, HCTZ 25 mg daily and Verapamil 120 mg three times daily.    Also was very tearful.  Had been under more stress- had to put her dog down and then her nephew passed away during the same week- 1st week in July.  Complained of not sleeping well. Started zoloft 25 mg daily, xanax prn panic attack/insomnia.   She feels she is actually worse.  More tearful.  "I had six panic attacks this morning."  She felt xanax was ineffective.  Denies SI or HI.  We referred her to psychotherapy, increased her zoloft to 50 mg daily, stopped xanax and added klonipin. She has noticed some improvement- still having more bad days than goods. Meeting with Erika [name] for second time today.  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  Current Outpatient Prescriptions on File Prior to Visit  Medication Sig Dispense Refill  . clonazePAM (KLONOPIN) 0.5 MG disintegrating tablet Take 1 tablet (0.5 mg total) by mouth 2 (two) times daily as needed for seizure.  30 tablet  0  . enalapril (VASOTEC) 20 MG tablet Take 1 tablet (20 mg total) by mouth daily.  30 tablet  2  . ergocalciferol (VITAMIN D2) 50000 UNITS capsule Take 1 capsule (50,000 Units total) by mouth once a week. X 6 weeks  6 capsule  0  . hydrALAZINE (APRESOLINE) 25 MG tablet Take 1 tablet (25 mg total) by mouth 3 (three) times daily.  270 tablet  3  . hydrochlorothiazide (HYDRODIURIL) 25 MG tablet TAKE 1 pill daily  30 tablet  2  . sertraline (ZOLOFT) 50 MG tablet Take 1 tablet (50 mg total) by mouth daily.  30 tablet  3    . simvastatin (ZOCOR) 20 MG tablet TAKE 1 TABLET (20 MG TOTAL) BY MOUTH EVERY EVENING.  30 tablet  2  . verapamil (CALAN) 120 MG tablet Take 1 tablet (120 mg total) by mouth 3 (three) times daily.  90 tablet  2   No current facility-administered medications on file prior to visit.    No Known Allergies  Past Medical History  Diagnosis Date  . Acute serous otitis media   . Adjustment disorder with mixed anxiety and depressed mood   . Essential hypertension, benign   . Other and unspecified hyperlipidemia   . Unspecified essential hypertension   . Arthralgia of temporomandibular joint     Past Surgical History  Procedure Laterality Date  . Cholecystectomy    . Tonsillectomy    . Cesarean section      Family History  Problem Relation Age of Onset  . Diabetes Mother   . Diabetes Father   . Alcohol abuse Father     History   Social History  . Marital Status: Married    Spouse Name: [name]    Number of Children: [name]  . Years of Education: [name]   Occupational History  . Not on file.   Social History Main Topics  . Smoking status: Current Every Day Smoker --  1.00 packs/day    Types: Cigarettes  . Smokeless tobacco: Never Used  . Alcohol Use: No  . Drug Use: No  . Sexual Activity: Not on file   Other Topics Concern  . Not on file   Social History Narrative  . No narrative on file   The PMH, PSH, Social History, Family History, Medications, and allergies have been reviewed in [facility], and have been updated if relevant.   Review of Systems See HPI No SI or HI No HA No blurred vision No CP or SOB Difficulty concentrating Appetite ok- Wt Readings from Last 3 Encounters:  09/02/14 228 lb (103.42 kg)  08/10/14 224 lb (101.606 kg)  07/27/14 230 lb 8 oz (104.554 kg)   Not sleeping well- often has to take both of her klonipin at bedtime to sleep    Objective:    BP 158/98  Pulse 83  Temp(Src) 97.5 F (36.4 C) (Tympanic)  Wt 228 lb (103.42 kg)  SpO2 98%  LMP  07/26/2014   Physical Exam  Nursing note and vitals reviewed. Constitutional: She is oriented to person, place, and time. She appears well-developed and well-nourished. No distress.  HENT:  Head: Normocephalic.  Cardiovascular: Normal rate and regular rhythm.   Neurological: She is alert and oriented to person, place, and time. No cranial nerve deficit. Coordination normal.  Skin: Skin is warm and dry.  Psychiatric:  Tearful, good eye contact          Assessment & Plan:   Essential hypertension, benign  ADJUSTMENT DISORDER WITH MIXED FEATURES No Follow-up on file.

## 2014-09-16 ENCOUNTER — Ambulatory Visit (INDEPENDENT_AMBULATORY_CARE_PROVIDER_SITE_OTHER): Payer: Self-pay | Admitting: Psychology

## 2014-09-16 DIAGNOSIS — F4323 Adjustment disorder with mixed anxiety and depressed mood: Secondary | ICD-10-CM

## 2014-09-23 ENCOUNTER — Ambulatory Visit: Payer: Self-pay | Admitting: Psychology

## 2014-09-28 ENCOUNTER — Other Ambulatory Visit: Payer: Self-pay

## 2014-09-28 MED ORDER — CLONAZEPAM 1 MG PO TBDP: 1.0000 mg | ORAL_TABLET | Freq: Two times a day (BID) | ORAL | Status: DC | PRN

## 2014-09-28 NOTE — Telephone Encounter (Signed)
Rx called in to requested pharmacy 

## 2014-09-28 NOTE — Telephone Encounter (Signed)
Pt left v/m requesting refill clonazepam to [facility] [address]. Pt request cb when filled.

## 2014-10-14 ENCOUNTER — Ambulatory Visit (INDEPENDENT_AMBULATORY_CARE_PROVIDER_SITE_OTHER): Payer: Self-pay | Admitting: Psychology

## 2014-10-14 DIAGNOSIS — F4322 Adjustment disorder with anxiety: Secondary | ICD-10-CM

## 2014-10-21 ENCOUNTER — Other Ambulatory Visit: Payer: Self-pay | Admitting: *Deleted

## 2014-10-21 MED ORDER — CLONAZEPAM 1 MG PO TBDP: 1.0000 mg | ORAL_TABLET | Freq: Two times a day (BID) | ORAL | Status: DC | PRN

## 2014-10-21 NOTE — Telephone Encounter (Signed)
Last filled 09/29/14 Pt not due until 10/30/14

## 2014-10-21 NOTE — Telephone Encounter (Signed)
rx called into pharmacy

## 2014-11-23 ENCOUNTER — Other Ambulatory Visit: Payer: Self-pay | Admitting: *Deleted

## 2014-11-23 MED ORDER — CLONAZEPAM 1 MG PO TBDP: 1.0000 mg | ORAL_TABLET | Freq: Two times a day (BID) | ORAL | Status: DC | PRN

## 2014-11-23 NOTE — Telephone Encounter (Signed)
Pt requesting medication refill. Last f/u appt 08/2014. pls advise 

## 2014-11-24 NOTE — Telephone Encounter (Signed)
Rx called in to requested pharmacy 

## 2014-11-24 NOTE — Telephone Encounter (Signed)
Pharmacy line busy.

## 2014-12-15 ENCOUNTER — Other Ambulatory Visit: Payer: Self-pay

## 2014-12-15 NOTE — Telephone Encounter (Signed)
Pt left v/m requesting refill sertraline for depression/anxiety to [facility] [address] before the holiday. Pt last seen 09/02/14; no future appt scheduled

## 2014-12-16 MED ORDER — SERTRALINE HCL 50 MG PO TABS: 50.0000 mg | ORAL_TABLET | Freq: Every day | ORAL | Status: DC

## 2014-12-23 ENCOUNTER — Other Ambulatory Visit: Payer: Self-pay | Admitting: Family Medicine

## 2014-12-23 MED ORDER — CLONAZEPAM 1 MG PO TBDP
1.0000 mg | ORAL_TABLET | Freq: Two times a day (BID) | ORAL | Status: DC | PRN
Start: 2014-12-23 — End: 2015-01-21

## 2014-12-23 NOTE — Telephone Encounter (Signed)
Ok to phone in clonazepam 

## 2014-12-23 NOTE — Telephone Encounter (Signed)
Pt left vm on triage phone requesting refill.

## 2014-12-24 DIAGNOSIS — K760 Fatty (change of) liver, not elsewhere classified: Secondary | ICD-10-CM

## 2014-12-24 HISTORY — DX: Fatty (change of) liver, not elsewhere classified: K76.0

## 2014-12-27 NOTE — Telephone Encounter (Signed)
Rx called in to requested pharmacy 

## 2015-01-11 ENCOUNTER — Encounter: Payer: Self-pay | Admitting: Family Medicine

## 2015-01-11 ENCOUNTER — Ambulatory Visit (INDEPENDENT_AMBULATORY_CARE_PROVIDER_SITE_OTHER): Payer: Self-pay | Admitting: Family Medicine

## 2015-01-11 ENCOUNTER — Emergency Department: Payer: Self-pay | Admitting: Emergency Medicine

## 2015-01-11 VITALS — BP 192/112 | HR 88 | Temp 98.1°F | Wt 231.5 lb

## 2015-01-11 DIAGNOSIS — F4323 Adjustment disorder with mixed anxiety and depressed mood: Secondary | ICD-10-CM

## 2015-01-11 DIAGNOSIS — I160 Hypertensive urgency: Secondary | ICD-10-CM

## 2015-01-11 DIAGNOSIS — I10 Essential (primary) hypertension: Secondary | ICD-10-CM

## 2015-01-11 DIAGNOSIS — R0789 Other chest pain: Secondary | ICD-10-CM

## 2015-01-11 LAB — URINALYSIS, COMPLETE
BILIRUBIN, UR: NEGATIVE
BLOOD: NEGATIVE
Glucose,UR: NEGATIVE mg/dL (ref 0–75)
Hyaline Cast: 15
Ketone: NEGATIVE
LEUKOCYTE ESTERASE: NEGATIVE
Nitrite: NEGATIVE
PH: 5 (ref 4.5–8.0)
Protein: 100
RBC,UR: 1 /HPF (ref 0–5)
Specific Gravity: 1.024 (ref 1.003–1.030)
WBC UR: 2 /HPF (ref 0–5)

## 2015-01-11 LAB — COMPREHENSIVE METABOLIC PANEL
ALBUMIN: 3.4 g/dL (ref 3.4–5.0)
ALK PHOS: 91 U/L
ALT: 85 U/L — AB
ANION GAP: 8 (ref 7–16)
AST: 70 U/L — AB (ref 15–37)
BILIRUBIN TOTAL: 0.4 mg/dL (ref 0.2–1.0)
BUN: 6 mg/dL — AB (ref 7–18)
CREATININE: 0.77 mg/dL (ref 0.60–1.30)
Calcium, Total: 10 mg/dL (ref 8.5–10.1)
Chloride: 100 mmol/L (ref 98–107)
Co2: 27 mmol/L (ref 21–32)
EGFR (Non-African Amer.): >60
GLUCOSE: 183 mg/dL — AB (ref 65–99)
Osmolality: 272 (ref 275–301)
Potassium: 3.9 mmol/L (ref 3.5–5.1)
Sodium: 135 mmol/L — ABNORMAL LOW (ref 136–145)
TOTAL PROTEIN: 7.8 g/dL (ref 6.4–8.2)

## 2015-01-11 LAB — CBC
HCT: 48 % — ABNORMAL HIGH (ref 35.0–47.0)
HGB: 15.9 g/dL (ref 12.0–16.0)
MCH: 29.3 pg (ref 26.0–34.0)
MCHC: 33.1 g/dL (ref 32.0–36.0)
MCV: 89 fL (ref 80–100)
PLATELETS: 356 10*3/uL (ref 150–440)
RBC: 5.42 10*6/uL — ABNORMAL HIGH (ref 3.80–5.20)
RDW: 13.8 % (ref 11.5–14.5)
WBC: 15.3 10*3/uL — ABNORMAL HIGH (ref 3.6–11.0)

## 2015-01-11 LAB — HCG, QUANTITATIVE, PREGNANCY

## 2015-01-11 LAB — CK TOTAL AND CKMB (NOT AT ARMC): CK, TOTAL: 69 U/L (ref 26–192)

## 2015-01-11 LAB — TROPONIN I: Troponin-I: <0.02 ng/mL

## 2015-01-11 MED ORDER — SERTRALINE HCL 100 MG PO TABS: 100.0000 mg | ORAL_TABLET | Freq: Every day | ORAL | Status: DC

## 2015-01-11 NOTE — Progress Notes (Signed)
Subjective:   Patient ID: Erika [name], female    DOB: 1971/03/28, 44 y.o.   MRN: [id number]  MARLIES LIGMAN is a pleasant 44 y.o. year old female who presents to clinic today with Follow-up  on 01/11/2015  HPI   HTN- BP has been consistent elevated for past 2 weeks.  Checks her BP at home at least twice a day.  Also feels it has been elevated- HA, increased anxiety, warmth even with medication.  Has had intermittent chest pressure and left arm pain but she thinks that is more due to anxiety.   Taking Enalpril 20 mg daily, Hydralazine 25 mg three times daily, HCTZ 25 mg daily and Verapamil 120 mg three times daily.  Lab Results  Component Value Date   CREATININE 0.8 04/02/2013   Has been to renal- last seen in 05/2013- note reviewed.  For anxiety, taking zoloft 50 mg daily and Klonipin 1 mg prn. I also referred her to see Clint Bolder but she felt she did not get any benefit from those appointments and she does not have insurance.  Having a lot of stressors at home- dad's health, nephrew's recent death- she has no control over any of these stressors which she feels is making things worse.  Current Outpatient Prescriptions on File Prior to Visit  Medication Sig Dispense Refill  . clonazePAM (KLONOPIN) 1 MG disintegrating tablet Take 1 tablet (1 mg total) by mouth 2 (two) times daily as needed for seizure. 60 tablet 0  . enalapril (VASOTEC) 20 MG tablet Take 1 tablet (20 mg total) by mouth daily. 30 tablet 2  . ergocalciferol (VITAMIN D2) 50000 UNITS capsule Take 1 capsule (50,000 Units total) by mouth once a week. X 6 weeks 6 capsule 0  . hydrALAZINE (APRESOLINE) 25 MG tablet Take 1 tablet (25 mg total) by mouth 3 (three) times daily. 270 tablet 3  . hydrochlorothiazide (HYDRODIURIL) 25 MG tablet TAKE 1 pill daily 30 tablet 2  . simvastatin (ZOCOR) 20 MG tablet TAKE 1 TABLET (20 MG TOTAL) BY MOUTH EVERY EVENING. 30 tablet 2  . verapamil (CALAN) 120 MG tablet Take 1 tablet  (120 mg total) by mouth 3 (three) times daily. 90 tablet 2   No current facility-administered medications on file prior to visit.    No Known Allergies  Past Medical History  Diagnosis Date  . Acute serous otitis media   . Adjustment disorder with mixed anxiety and depressed mood   . Essential hypertension, benign   . Other and unspecified hyperlipidemia   . Unspecified essential hypertension   . Arthralgia of temporomandibular joint     Past Surgical History  Procedure Laterality Date  . Cholecystectomy    . Tonsillectomy    . Cesarean section      Family History  Problem Relation Age of Onset  . Diabetes Mother   . Diabetes Father   . Alcohol abuse Father     History   Social History  . Marital Status: Married    Spouse Name: [name]    Number of Children: [name]  . Years of Education: [name]   Occupational History  . Not on file.   Social History Main Topics  . Smoking status: Current Every Day Smoker -- 1.00 packs/day    Types: Cigarettes  . Smokeless tobacco: Never Used  . Alcohol Use: No  . Drug Use: No  . Sexual Activity: Not on file   Other Topics Concern  . Not on file  Social History Narrative   The PMH, PSH, Social History, Family History, Medications, and allergies have been reviewed in [facility], and have been updated if relevant.   Review of Systems  Constitutional: Negative.   HENT: Negative.   Eyes: Negative.   Respiratory: Positive for chest tightness. Negative for shortness of breath.   Cardiovascular: Positive for palpitations. Negative for leg swelling.  Gastrointestinal: Negative.   Endocrine: Negative.   Genitourinary: Negative.   Musculoskeletal: Negative.   Skin: Negative.   Allergic/Immunologic: Negative.   Neurological: Negative.   Hematological: Negative.   Psychiatric/Behavioral: Negative for suicidal ideas, sleep disturbance and agitation. The patient is nervous/anxious.   All other systems reviewed and are negative.        Objective:    BP 192/112 mmHg  Pulse 88  Temp(Src) 98.1 F (36.7 C) (Oral)  Wt 231 lb 8 oz (105.008 kg)  SpO2 96%  LMP 12/06/2014  BP Readings from Last 3 Encounters:  01/11/15 192/112  09/02/14 158/98  08/10/14 160/90    Physical Exam  Constitutional: She is oriented to person, place, and time. She appears well-developed and well-nourished. No distress.  HENT:  Head: Normocephalic.  Eyes: Conjunctivae are normal.  Neck: Normal range of motion.  Cardiovascular: Normal rate and regular rhythm.   Pulmonary/Chest: Effort normal.  Abdominal: Soft.  Musculoskeletal: Normal range of motion.  Neurological: She is alert and oriented to person, place, and time.  Skin: Skin is warm and dry.  Psychiatric:  Tearful, anxious  Good eye contact, pleasant  Nursing note and vitals reviewed.         Assessment & Plan:   HTN (hypertension), malignant  ADJUSTMENT DISORDER WITH MIXED FEATURES  Hypertensive urgency - Plan: Comprehensive metabolic panel, Ambulatory referral to Cardiology  Chest pressure - Plan: Comprehensive metabolic panel, Ambulatory referral to Cardiology No Follow-up on file.

## 2015-01-11 NOTE — Assessment & Plan Note (Addendum)
Deteriorated again- has gone through periods where her BP was under excellent control.  Influenced by stressors at home as well. Will increase dose of Hydralazine to 50 mg three times daily. Refer to cardiology and send to ER given changes on EKG.  She will continue to check BP at home daily and go to ER if chest pain returns or if her BP is not coming down like we discussed today.

## 2015-01-11 NOTE — Progress Notes (Signed)
Pre visit review using our clinic review tool, if applicable. No additional management support is needed unless otherwise documented below in the visit note. 

## 2015-01-11 NOTE — Assessment & Plan Note (Signed)
Deteriorated. Explained importance of coping with her stressors since she is having serious physiological symptoms.  She does not feel psychotherapy is cost prohibitive or effective for her. Will increase dose of Zoloft to 100 mg daily, continue xanax. Follow up by phone in 2 weeks.

## 2015-01-11 NOTE — Assessment & Plan Note (Signed)
With changes on EKG- new neg T waves in lateral leads and hypertensive urgency. She refuses transport to ER via EMS but her husband will be taking her immediately.  I will call our cardiologists to see her in ER.

## 2015-01-11 NOTE — Progress Notes (Signed)
Pt called and stated that she was d/c'd from [facility] and told cardiac etiology ruled out, likely "just anxiety." Given CP, markedly elevated BP and DOE, I would still like for her to see cardiologist.  Referral placed.

## 2015-01-11 NOTE — Patient Instructions (Addendum)
Great to see you. Please go STRAIGHT to the ER.  We are increasing your zoloft to 100 mg daily.  We are also increasing your Hydralazine to 50 mg three times daily.  Please call us after you are seen in the ER.

## 2015-01-12 ENCOUNTER — Telehealth: Payer: Self-pay | Admitting: Family Medicine

## 2015-01-12 NOTE — Telephone Encounter (Signed)
emmi emailed °

## 2015-01-13 ENCOUNTER — Other Ambulatory Visit: Payer: Self-pay | Admitting: Family Medicine

## 2015-01-13 DIAGNOSIS — I10 Essential (primary) hypertension: Secondary | ICD-10-CM

## 2015-01-13 NOTE — Addendum Note (Signed)
Addended by: Ellamae Sia on: 01/13/2015 10:13 AM   Modules accepted: Orders

## 2015-01-21 ENCOUNTER — Other Ambulatory Visit (INDEPENDENT_AMBULATORY_CARE_PROVIDER_SITE_OTHER): Payer: Self-pay

## 2015-01-21 ENCOUNTER — Other Ambulatory Visit: Payer: Self-pay | Admitting: *Deleted

## 2015-01-21 ENCOUNTER — Telehealth: Payer: Self-pay | Admitting: *Deleted

## 2015-01-21 DIAGNOSIS — I10 Essential (primary) hypertension: Secondary | ICD-10-CM

## 2015-01-21 LAB — COMPREHENSIVE METABOLIC PANEL
ALT: 71 U/L — ABNORMAL HIGH (ref 0–35)
AST: 57 U/L — ABNORMAL HIGH (ref 0–37)
Albumin: 4.1 g/dL (ref 3.5–5.2)
Alkaline Phosphatase: 79 U/L (ref 39–117)
BUN: 8 mg/dL (ref 6–23)
CALCIUM: 10.3 mg/dL (ref 8.4–10.5)
CO2: 27 mEq/L (ref 19–32)
Chloride: 102 mEq/L (ref 96–112)
Creatinine, Ser: 0.67 mg/dL (ref 0.40–1.20)
GFR: 103.02 mL/min (ref >60.00)
Glucose, Bld: 158 mg/dL — ABNORMAL HIGH (ref 70–99)
Potassium: 4.1 mEq/L (ref 3.5–5.1)
Sodium: 136 mEq/L (ref 135–145)
TOTAL PROTEIN: 7.5 g/dL (ref 6.0–8.3)
Total Bilirubin: 0.3 mg/dL (ref 0.2–1.2)

## 2015-01-21 MED ORDER — CLONAZEPAM 1 MG PO TBDP: 1.0000 mg | ORAL_TABLET | Freq: Two times a day (BID) | ORAL | Status: DC | PRN

## 2015-01-21 NOTE — Telephone Encounter (Signed)
Patient walked in to office requesting refill of Klonopin sent to [facility] on [address].  Last Filled:    60 tablet 0 Rf  on 12/23/2014  Please advise.

## 2015-01-24 NOTE — Telephone Encounter (Signed)
Rx called in to requested pharmacy 

## 2015-01-25 NOTE — Telephone Encounter (Signed)
Erroneous encounter

## 2015-01-27 ENCOUNTER — Telehealth: Payer: Self-pay | Admitting: Family Medicine

## 2015-01-27 NOTE — Telephone Encounter (Signed)
Patient returned your call.

## 2015-01-27 NOTE — Telephone Encounter (Signed)
See recent result note

## 2015-02-07 ENCOUNTER — Ambulatory Visit: Payer: Self-pay | Admitting: Cardiovascular Disease

## 2015-02-09 ENCOUNTER — Ambulatory Visit (INDEPENDENT_AMBULATORY_CARE_PROVIDER_SITE_OTHER): Payer: Self-pay | Admitting: Cardiovascular Disease

## 2015-02-09 ENCOUNTER — Encounter: Payer: Self-pay | Admitting: Cardiovascular Disease

## 2015-02-09 VITALS — BP 190/118 | HR 84 | Ht 67.0 in | Wt 234.8 lb

## 2015-02-09 DIAGNOSIS — R9431 Abnormal electrocardiogram [ECG] [EKG]: Secondary | ICD-10-CM

## 2015-02-09 DIAGNOSIS — R0789 Other chest pain: Secondary | ICD-10-CM

## 2015-02-09 DIAGNOSIS — I10 Essential (primary) hypertension: Secondary | ICD-10-CM

## 2015-02-09 MED ORDER — CARVEDILOL 6.25 MG PO TABS: 6.2500 mg | ORAL_TABLET | Freq: Two times a day (BID) | ORAL | Status: DC

## 2015-02-09 MED ORDER — SPIRONOLACTONE 25 MG PO TABS: 25.0000 mg | ORAL_TABLET | Freq: Every day | ORAL | Status: DC

## 2015-02-09 MED ORDER — VERAPAMIL HCL 80 MG PO TABS: 80.0000 mg | ORAL_TABLET | Freq: Three times a day (TID) | ORAL | Status: DC

## 2015-02-09 NOTE — Patient Instructions (Addendum)
Your physician has recommended you make the following change in your medication:  Stop Hydralazine  Decrease Verapamil to 80 mg  (one tablet) three times daily  Start Spironolactone 25 mg once daily  Start Carvedilol 6.25 mg twice daily   Your physician recommends that you return for lab work in:  BMP in one week   Your physician recommends that you schedule a follow-up appointment in:  2 weeks with Dr. Fletcher Anon

## 2015-02-10 ENCOUNTER — Encounter: Payer: Self-pay | Admitting: Cardiovascular Disease

## 2015-02-10 NOTE — Assessment & Plan Note (Signed)
The patient has refractory hypertension with negative workup for secondary hypertension in the past. Should consider evaluation for sleep apnea which might be contributing. In order to simplify her antihypertensive regimen, I made the following changes: I stopped hydralazine and started spironolactone 25 mg once daily which is known to be helpful in cases of refractory hypertension. Check basic metabolic profile in one week. I'm going to attempt switching her from verapamil to carvedilol which has a better antihypertensive effect. I decreased the dose of verapamil to 80 mg 3 times daily and started carvedilol 6.25 mg twice daily.

## 2015-02-10 NOTE — Assessment & Plan Note (Signed)
Chest pain and exertional dyspnea could be related to uncontrolled hypertension. The patient prefers to hold off on further evaluation at the present time to see how she responds to blood pressure control. If symptoms persist, further workup with a stress test can be considered.

## 2015-02-10 NOTE — Progress Notes (Signed)
Primary care physician: Dr. Deborra Medina  HPI  This is a 44 year old female who was referred for evaluation of refractory hypertension and chest pain. She has no previous cardiac history. She reports history of hypertension for 18 years. Initially it was controlled mostly on hydrochlorothiazide. However, over the last 3-4 years, it became very difficult to control. She reports being evaluated by Dr. Holley Raring with negative renal workup. It also seems that she had renal artery duplex in 2014 and was told there was no significant disease. The results are not available. She has been more anxious lately which might be contributing. She has been having worsening exertional dyspnea and palpitations with occasional chest discomfort which is not necessarily exertional. She is not aware of sleep apnea but does report poor quality sleep.  She does smoke one pack per day and denies any alcohol use. There is no family history of coronary artery disease.  No Known Allergies   Current Outpatient Prescriptions on File Prior to Visit  Medication Sig Dispense Refill  . clonazePAM (KLONOPIN) 1 MG disintegrating tablet Take 1 tablet (1 mg total) by mouth 2 (two) times daily as needed for seizure. 60 tablet 0  . enalapril (VASOTEC) 20 MG tablet Take 1 tablet (20 mg total) by mouth daily. 30 tablet 2  . ergocalciferol (VITAMIN D2) 50000 UNITS capsule Take 1 capsule (50,000 Units total) by mouth once a week. X 6 weeks 6 capsule 0  . hydrochlorothiazide (HYDRODIURIL) 25 MG tablet TAKE 1 pill daily 30 tablet 2  . simvastatin (ZOCOR) 20 MG tablet TAKE 1 TABLET (20 MG TOTAL) BY MOUTH EVERY EVENING. 30 tablet 2   No current facility-administered medications on file prior to visit.     Past Medical History  Diagnosis Date  . Acute serous otitis media   . Adjustment disorder with mixed anxiety and depressed mood   . Essential hypertension, benign   . Other and unspecified hyperlipidemia   . Unspecified essential hypertension    . Arthralgia of temporomandibular joint      Past Surgical History  Procedure Laterality Date  . Cholecystectomy    . Tonsillectomy    . Cesarean section       Family History  Problem Relation Age of Onset  . Diabetes Mother   . Diabetes Father   . Alcohol abuse Father   . Heart disease Father   . Stroke Sister      History   Social History  . Marital Status: Married    Spouse Name: [name]  . Number of Children: [name]  . Years of Education: [name]   Occupational History  . Not on file.   Social History Main Topics  . Smoking status: Current Every Day Smoker -- 1.00 packs/day    Types: Cigarettes  . Smokeless tobacco: Never Used  . Alcohol Use: No  . Drug Use: No  . Sexual Activity: Not on file   Other Topics Concern  . Not on file   Social History Narrative     ROS A 10 point review of system was performed. It is negative other than that mentioned in the history of present illness.   PHYSICAL EXAM   BP 190/118 mmHg  Pulse 84  Ht 5\' 7"  (1.702 m)  Wt 234 lb 12 oz (106.482 kg)  BMI 36.76 kg/m2  LMP 12/06/2014 Constitutional: She is oriented to person, place, and time. She appears well-developed and well-nourished. No distress.  HENT: No nasal discharge.  Head: Normocephalic and atraumatic.  Eyes: Pupils  are equal and round. No discharge.  Neck: Normal range of motion. Neck supple. No JVD present. No thyromegaly present.  Cardiovascular: Normal rate, regular rhythm, normal heart sounds. Exam reveals no gallop and no friction rub. No murmur heard.  Pulmonary/Chest: Effort normal and breath sounds normal. No stridor. No respiratory distress. She has no wheezes. She has no rales. She exhibits no tenderness.  Abdominal: Soft. Bowel sounds are normal. She exhibits no distension. There is no tenderness. There is no rebound and no guarding.  Musculoskeletal: Normal range of motion. She exhibits no edema and no tenderness.  Neurological: She is alert and oriented  to person, place, and time. Coordination normal.  Skin: Skin is warm and dry. No rash noted. She is not diaphoretic. No erythema. No pallor.  Psychiatric: She has a normal mood and affect. Her behavior is normal. Judgment and thought content normal.     MVE:HMCNO  Rhythm  -  Nonspecific T-abnormality.   ABNORMAL     ASSESSMENT AND PLAN

## 2015-02-16 ENCOUNTER — Other Ambulatory Visit: Payer: Self-pay

## 2015-02-16 ENCOUNTER — Other Ambulatory Visit: Payer: Self-pay | Admitting: Cardiovascular Disease

## 2015-02-16 DIAGNOSIS — I10 Essential (primary) hypertension: Secondary | ICD-10-CM

## 2015-02-23 ENCOUNTER — Other Ambulatory Visit: Payer: Self-pay | Admitting: *Deleted

## 2015-02-23 MED ORDER — CLONAZEPAM 1 MG PO TBDP: 1.0000 mg | ORAL_TABLET | Freq: Two times a day (BID) | ORAL | Status: DC | PRN

## 2015-02-23 NOTE — Telephone Encounter (Signed)
Last f/u appt 08/2014 

## 2015-02-23 NOTE — Telephone Encounter (Signed)
Rx called in to requested pharmacy 

## 2015-02-24 ENCOUNTER — Ambulatory Visit: Payer: Self-pay | Admitting: Cardiovascular Disease

## 2015-03-07 ENCOUNTER — Encounter: Payer: Self-pay | Admitting: Cardiovascular Disease

## 2015-03-07 ENCOUNTER — Ambulatory Visit (INDEPENDENT_AMBULATORY_CARE_PROVIDER_SITE_OTHER): Payer: Self-pay | Admitting: Cardiovascular Disease

## 2015-03-07 VITALS — BP 130/80 | HR 71 | Ht 67.0 in | Wt 235.2 lb

## 2015-03-07 DIAGNOSIS — R0602 Shortness of breath: Secondary | ICD-10-CM

## 2015-03-07 DIAGNOSIS — I10 Essential (primary) hypertension: Secondary | ICD-10-CM

## 2015-03-07 DIAGNOSIS — R079 Chest pain, unspecified: Secondary | ICD-10-CM

## 2015-03-07 DIAGNOSIS — R0789 Other chest pain: Secondary | ICD-10-CM

## 2015-03-07 MED ORDER — CARVEDILOL 12.5 MG PO TABS: 12.5000 mg | ORAL_TABLET | Freq: Two times a day (BID) | ORAL | Status: DC

## 2015-03-07 NOTE — Progress Notes (Signed)
Primary care physician: Dr. Deborra Medina  HPI  This is a 44 year old female who is here today for a follow-up visit regarding refractory hypertension and chest pain. She has no previous cardiac history. She reports history of hypertension for 18 years. Initially it was controlled mostly on hydrochlorothiazide. However, over the last 3-4 years, it became very difficult to control. She reports being evaluated by Dr. Holley Raring with negative renal workup. It also seems that she had renal artery duplex in 2014 and was told there was no significant disease. The results are not available.   She is not aware of sleep apnea but does report poor quality sleep.  She does smoke one pack per day and denies any alcohol use. There is no family history of coronary artery disease.  during last visit, I stopped hydralazine and added spironolactone. I decreased verapamil and added carvedilol. Blood pressure improved significantly since then. She continues to have dyspnea with minimal activities and occasional chest pain.  No Known Allergies   Current Outpatient Prescriptions on File Prior to Visit  Medication Sig Dispense Refill  . carvedilol (COREG) 6.25 MG tablet Take 1 tablet (6.25 mg total) by mouth 2 (two) times daily. 60 tablet 6  . clonazePAM (KLONOPIN) 1 MG disintegrating tablet Take 1 tablet (1 mg total) by mouth 2 (two) times daily as needed for seizure. 60 tablet 0  . enalapril (VASOTEC) 20 MG tablet Take 1 tablet (20 mg total) by mouth daily. 30 tablet 2  . ergocalciferol (VITAMIN D2) 50000 UNITS capsule Take 1 capsule (50,000 Units total) by mouth once a week. X 6 weeks 6 capsule 0  . hydrochlorothiazide (HYDRODIURIL) 25 MG tablet TAKE 1 pill daily 30 tablet 2  . sertraline (ZOLOFT) 100 MG tablet Take 100 mg by mouth daily.    . simvastatin (ZOCOR) 20 MG tablet TAKE 1 TABLET (20 MG TOTAL) BY MOUTH EVERY EVENING. 30 tablet 2  . spironolactone (ALDACTONE) 25 MG tablet Take 1 tablet (25 mg total) by mouth daily.  30 tablet 6  . verapamil (CALAN) 80 MG tablet Take 1 tablet (80 mg total) by mouth 3 (three) times daily. 90 tablet 6   No current facility-administered medications on file prior to visit.     Past Medical History  Diagnosis Date  . Acute serous otitis media   . Adjustment disorder with mixed anxiety and depressed mood   . Essential hypertension, benign   . Other and unspecified hyperlipidemia   . Unspecified essential hypertension   . Arthralgia of temporomandibular joint      Past Surgical History  Procedure Laterality Date  . Cholecystectomy    . Tonsillectomy    . Cesarean section       Family History  Problem Relation Age of Onset  . Diabetes Mother   . Diabetes Father   . Alcohol abuse Father   . Heart disease Father   . Stroke Sister      History   Social History  . Marital Status: Married    Spouse Name: [name]  . Number of Children: [name]  . Years of Education: [name]   Occupational History  . Not on file.   Social History Main Topics  . Smoking status: Current Every Day Smoker -- 1.00 packs/day    Types: Cigarettes  . Smokeless tobacco: Never Used  . Alcohol Use: No  . Drug Use: No  . Sexual Activity: Not on file   Other Topics Concern  . Not on file   Social History  Narrative     ROS A 10 point review of system was performed. It is negative other than that mentioned in the history of present illness.   PHYSICAL EXAM   BP 130/80 mmHg  Pulse 71  Ht 5\' 7"  (1.702 m)  Wt 235 lb 4 oz (106.709 kg)  BMI 36.84 kg/m2 Constitutional: She is oriented to person, place, and time. She appears well-developed and well-nourished. No distress.  HENT: No nasal discharge.  Head: Normocephalic and atraumatic.  Eyes: Pupils are equal and round. No discharge.  Neck: Normal range of motion. Neck supple. No JVD present. No thyromegaly present.  Cardiovascular: Normal rate, regular rhythm, normal heart sounds. Exam reveals no gallop and no friction rub. No  murmur heard.  Pulmonary/Chest: Effort normal and breath sounds normal. No stridor. No respiratory distress. She has no wheezes. She has no rales. She exhibits no tenderness.  Abdominal: Soft. Bowel sounds are normal. She exhibits no distension. There is no tenderness. There is no rebound and no guarding.  Musculoskeletal: Normal range of motion. She exhibits no edema and no tenderness.  Neurological: She is alert and oriented to person, place, and time. Coordination normal.  Skin: Skin is warm and dry. No rash noted. She is not diaphoretic. No erythema. No pallor.  Psychiatric: She has a normal mood and affect. Her behavior is normal. Judgment and thought content normal.        ASSESSMENT AND PLAN

## 2015-03-07 NOTE — Patient Instructions (Addendum)
Your physician has requested that you have a stress echocardiogram. For further information please visit [email or web address]. Please follow instruction sheet as given. -Hold coreg the morning of test  -wear comfortable cloths and lace up walking shoes  -eat a small meal  -no lotion on the skin   Your physician has recommended you make the following change in your medication:  Stop Verapamil  Increase Coreg to 12.5 mg twice daily   Your physician recommends that you schedule a follow-up appointment in:  1 month

## 2015-03-08 NOTE — Assessment & Plan Note (Signed)
Blood pressure improved significantly after the recent changes in her medications. Today, I discontinued verapamil and increased the dose of carvedilol to 12.5 mg twice daily. Continue other medications.

## 2015-03-08 NOTE — Assessment & Plan Note (Signed)
Given that her blood pressure is now controlled, I recommend further ischemic cardiac evaluation with a stress echocardiogram.

## 2015-03-21 ENCOUNTER — Other Ambulatory Visit: Payer: Self-pay

## 2015-03-25 ENCOUNTER — Other Ambulatory Visit: Payer: Self-pay | Admitting: *Deleted

## 2015-03-25 MED ORDER — CLONAZEPAM 1 MG PO TBDP: 1.0000 mg | ORAL_TABLET | Freq: Two times a day (BID) | ORAL | Status: DC | PRN

## 2015-03-25 NOTE — Telephone Encounter (Signed)
Called to [facility] [address] 

## 2015-03-25 NOTE — Telephone Encounter (Signed)
Last office visit 01/11/2015.  Last refilled 02/23/2015 for #60 with no refills.  Ok to refill?

## 2015-03-31 ENCOUNTER — Ambulatory Visit: Payer: Self-pay | Admitting: Cardiovascular Disease

## 2015-04-22 ENCOUNTER — Other Ambulatory Visit: Payer: Self-pay | Admitting: *Deleted

## 2015-04-22 MED ORDER — CLONAZEPAM 1 MG PO TBDP: 1.0000 mg | ORAL_TABLET | Freq: Two times a day (BID) | ORAL | Status: DC | PRN

## 2015-04-22 NOTE — Telephone Encounter (Signed)
Last f/u appt 08/2014 

## 2015-04-22 NOTE — Telephone Encounter (Signed)
Rx called in to requested pharmacy 

## 2015-04-25 ENCOUNTER — Encounter: Payer: Self-pay | Admitting: Family Medicine

## 2015-04-25 ENCOUNTER — Ambulatory Visit (INDEPENDENT_AMBULATORY_CARE_PROVIDER_SITE_OTHER): Payer: Self-pay | Admitting: Family Medicine

## 2015-04-25 VITALS — BP 164/102 | HR 83 | Temp 98.0°F | Wt 237.5 lb

## 2015-04-25 DIAGNOSIS — F4323 Adjustment disorder with mixed anxiety and depressed mood: Secondary | ICD-10-CM

## 2015-04-25 DIAGNOSIS — I10 Essential (primary) hypertension: Secondary | ICD-10-CM

## 2015-04-25 DIAGNOSIS — F4001 Agoraphobia with panic disorder: Secondary | ICD-10-CM | POA: Insufficient documentation

## 2015-04-25 MED ORDER — HYDROCHLOROTHIAZIDE 25 MG PO TABS: ORAL_TABLET | ORAL | Status: DC

## 2015-04-25 MED ORDER — ENALAPRIL MALEATE 20 MG PO TABS: 20.0000 mg | ORAL_TABLET | Freq: Every day | ORAL | Status: DC

## 2015-04-25 MED ORDER — SIMVASTATIN 20 MG PO TABS: ORAL_TABLET | ORAL | Status: DC

## 2015-04-25 NOTE — Assessment & Plan Note (Signed)
Deteriorated today but just took her rxs. Asymptomatic and has been normotensive at home. No changes made today- continue to monitor at home.

## 2015-04-25 NOTE — Progress Notes (Signed)
Subjective:   Patient ID: Erika [name], female    DOB: Jul 14, 1971, 44 y.o.   MRN: [id number]  Erika [name] is a pleasant 44 y.o. year old female who presents to clinic today with Follow-up  on 04/25/2015  HPI   HTN- per BP has been "pretty good"- ranging from 1 20s-130s to "high" when she is anxious.  Taking Enalpril 20 mg daily, spironolactone 25 mg daily (started by cardiology), coreg 12.5 mg twice daily,  and Verapamil 120 mg three times daily.  Lab Results  Component Value Date   CREATININE 0.67 01/21/2015    For anxiety, taking zoloft 100 mg daily and Klonipin 1 mg prn. I also referred her to see Clint Bolder but she felt she did not get any benefit from those appointments and she does not have insurance.  Having a lot of stressors at home- feels guilty that she is not working, family health and other stressors.  Year anniversary of her nephew's death- his mom calls her everyday to discuss this.  This is hard on her. Still having panic attacks every day.  Usually only take Klonopin at night.  Now getting anxious when she knows she has to leave the house.  Tearful.  No SI or HI.  Appetite ok- "forces herself to eat."  Mom is bipolar but she is not her biological mother.  Wt Readings from Last 3 Encounters:  04/25/15 237 lb 8 oz (107.729 kg)  03/07/15 235 lb 4 oz (106.709 kg)  02/09/15 234 lb 12 oz (106.482 kg)     Current Outpatient Prescriptions on File Prior to Visit  Medication Sig Dispense Refill  . carvedilol (COREG) 12.5 MG tablet Take 1 tablet (12.5 mg total) by mouth 2 (two) times daily. 60 tablet 6  . clonazePAM (KLONOPIN) 1 MG disintegrating tablet Take 1 tablet (1 mg total) by mouth 2 (two) times daily as needed for seizure. 60 tablet 0  . sertraline (ZOLOFT) 100 MG tablet Take 100 mg by mouth daily.    Marland Kitchen spironolactone (ALDACTONE) 25 MG tablet Take 1 tablet (25 mg total) by mouth daily. 30 tablet 6   No current facility-administered medications on  file prior to visit.    No Known Allergies  Past Medical History  Diagnosis Date  . Acute serous otitis media   . Adjustment disorder with mixed anxiety and depressed mood   . Essential hypertension, benign   . Other and unspecified hyperlipidemia   . Unspecified essential hypertension   . Arthralgia of temporomandibular joint     Past Surgical History  Procedure Laterality Date  . Cholecystectomy    . Tonsillectomy    . Cesarean section      Family History  Problem Relation Age of Onset  . Diabetes Mother   . Diabetes Father   . Alcohol abuse Father   . Heart disease Father   . Stroke Sister     History   Social History  . Marital Status: Married    Spouse Name: [name]  . Number of Children: [name]  . Years of Education: [name]   Occupational History  . Not on file.   Social History Main Topics  . Smoking status: Current Every Day Smoker -- 1.00 packs/day    Types: Cigarettes  . Smokeless tobacco: Never Used  . Alcohol Use: No  . Drug Use: No  . Sexual Activity: Not on file   Other Topics Concern  . Not on file   Social History Narrative  The PMH, PSH, Social History, Family History, Medications, and allergies have been reviewed in [facility], and have been updated if relevant.   Review of Systems  Constitutional: Negative.   HENT: Negative.   Eyes: Negative.   Respiratory: Negative for chest tightness and shortness of breath.   Cardiovascular: Negative for palpitations and leg swelling.  Gastrointestinal: Negative.   Endocrine: Negative.   Genitourinary: Negative.   Musculoskeletal: Negative.   Skin: Negative.   Allergic/Immunologic: Negative.   Neurological: Negative.   Hematological: Negative.   Psychiatric/Behavioral: Positive for dysphoric mood. Negative for suicidal ideas, sleep disturbance and agitation. The patient is nervous/anxious.   All other systems reviewed and are negative.      Objective:    BP 164/102 mmHg  Pulse 83  Temp(Src) 98 F  (36.7 C) (Oral)  Wt 237 lb 8 oz (107.729 kg)  SpO2 96%  LMP 04/24/2015  BP Readings from Last 3 Encounters:  04/25/15 164/102  03/07/15 130/80  02/09/15 190/118    Physical Exam  Constitutional: She is oriented to person, place, and time. She appears well-developed and well-nourished. No distress.  HENT:  Head: Normocephalic.  Eyes: Conjunctivae are normal.  Neck: Normal range of motion.  Cardiovascular: Normal rate and regular rhythm.   Pulmonary/Chest: Effort normal.  Abdominal: Soft.  Musculoskeletal: Normal range of motion.  Neurological: She is alert and oriented to person, place, and time.  Skin: Skin is warm and dry.  Psychiatric:  Tearful, anxious  Good eye contact, pleasant  Nursing note and vitals reviewed.         Assessment & Plan:   HTN (hypertension), malignant  ADJUSTMENT DISORDER WITH MIXED FEATURES No Follow-up on file.

## 2015-04-25 NOTE — Progress Notes (Signed)
Pre visit review using our clinic review tool, if applicable. No additional management support is needed unless otherwise documented below in the visit note. 

## 2015-04-25 NOTE — Patient Instructions (Signed)
Good to see you. Hang in there. Please stop by to see Rosaria Ferries on your way out.

## 2015-04-25 NOTE — Assessment & Plan Note (Signed)
Deteriorated, now with signs of agoraphobia. >25 minutes spent in face to face time with patient, >50% spent in counselling or coordination of care. Discussed tx options.  At this point, I feel it is very important that she see a psychiatrist to help with medication management.  She did agree to this- referral placed.

## 2015-04-26 ENCOUNTER — Other Ambulatory Visit: Payer: Self-pay | Admitting: Family Medicine

## 2015-04-26 DIAGNOSIS — R748 Abnormal levels of other serum enzymes: Secondary | ICD-10-CM

## 2015-04-27 ENCOUNTER — Encounter: Payer: Self-pay | Admitting: *Deleted

## 2015-04-27 ENCOUNTER — Other Ambulatory Visit (INDEPENDENT_AMBULATORY_CARE_PROVIDER_SITE_OTHER): Payer: Self-pay

## 2015-04-27 DIAGNOSIS — R748 Abnormal levels of other serum enzymes: Secondary | ICD-10-CM

## 2015-04-27 LAB — HEPATIC FUNCTION PANEL
ALT: 60 U/L — AB (ref 0–35)
AST: 54 U/L — AB (ref 0–37)
Albumin: 3.9 g/dL (ref 3.5–5.2)
Alkaline Phosphatase: 87 U/L (ref 39–117)
Bilirubin, Direct: 0.1 mg/dL (ref 0.0–0.3)
Total Bilirubin: 0.4 mg/dL (ref 0.2–1.2)
Total Protein: 7.7 g/dL (ref 6.0–8.3)

## 2015-05-05 ENCOUNTER — Emergency Department
Admission: EM | Admit: 2015-05-05 | Discharge: 2015-05-05 | Disposition: A | Discharge status: Home / Self Care | Payer: Self-pay | Attending: Emergency Medicine | Admitting: Emergency Medicine

## 2015-05-05 DIAGNOSIS — Z720 Tobacco use: Secondary | ICD-10-CM | POA: Insufficient documentation

## 2015-05-05 DIAGNOSIS — Z79899 Other long term (current) drug therapy: Secondary | ICD-10-CM | POA: Insufficient documentation

## 2015-05-05 DIAGNOSIS — I10 Essential (primary) hypertension: Secondary | ICD-10-CM | POA: Insufficient documentation

## 2015-05-05 MED ORDER — CLONIDINE HCL 0.1 MG PO TABS
ORAL_TABLET | ORAL | Status: AC
  Administered 2015-05-05: 03:00:00
  Filled 2015-05-05: qty 1

## 2015-05-05 NOTE — ED Notes (Signed)
Pt has cluster headache.  Pt has n/v x 1 tonight.  Sx began at 1300 today.  md at bedside and pt placed on 100%nrb oxgen.  meds given for blood pressure.  Pt has htn.  Speech clear .  Alert.

## 2015-05-05 NOTE — Discharge Instructions (Signed)

## 2015-05-05 NOTE — ED Notes (Addendum)
Patient ambulatory to triage with steady gait, without difficulty or distress noted; pt st hx malignant HTN and cluster HA; BP elevated tonight with c/o frontal HA

## 2015-05-05 NOTE — ED Provider Notes (Signed)
[facility] Emergency Department Provider Note  ____________________________________________  Time seen: 2:45 AM  I have reviewed the triage vital signs and the nursing notes.   HISTORY  Chief Complaint Hypertension      HPI Erika [name] is a 44 y.o. female presents with elevated blood pressure at home per patient to 322 systolic. Patient admits to previous episodes of the same "I have malignant hypertension". Patient denies any pain at present no nausea no vomiting no weakness no numbness no gait instability and no vision changes     Past Medical History  Diagnosis Date  . Acute serous otitis media   . Adjustment disorder with mixed anxiety and depressed mood   . Essential hypertension, benign   . Other and unspecified hyperlipidemia   . Unspecified essential hypertension   . Arthralgia of temporomandibular joint     Patient Active Problem List   Diagnosis Date Noted  . Agoraphobia with panic attacks 04/25/2015  . Hypertensive urgency 01/11/2015  . Chest pressure 01/11/2015  . HTN (hypertension), malignant 10/20/2013  . Abdominal pain, epigastric 04/22/2013  . Abnormal CBC 04/13/2013  . Other malaise and fatigue 04/02/2013  . Lumbar back pain 02/13/2013  . Accessory skin tags 04/03/2012  . Cluster headache 03/20/2012  . TMJ PAIN 09/30/2010  . HYPERLIPIDEMIA 07/26/2010  . ADJUSTMENT DISORDER WITH MIXED FEATURES 07/26/2010    Past Surgical History  Procedure Laterality Date  . Cholecystectomy    . Tonsillectomy    . Cesarean section      Current Outpatient Rx  Name  Route  Sig  Dispense  Refill  . carvedilol (COREG) 12.5 MG tablet   Oral   Take 1 tablet (12.5 mg total) by mouth 2 (two) times daily.   60 tablet   6   . clonazePAM (KLONOPIN) 1 MG disintegrating tablet   Oral   Take 1 tablet (1 mg total) by mouth 2 (two) times daily as needed for seizure.   60 tablet   0   . enalapril (VASOTEC) 20 MG tablet   Oral    Take 1 tablet (20 mg total) by mouth daily.   30 tablet   5   . hydrochlorothiazide (HYDRODIURIL) 25 MG tablet      TAKE 1 pill daily   30 tablet   2   . sertraline (ZOLOFT) 100 MG tablet   Oral   Take 100 mg by mouth daily.         . simvastatin (ZOCOR) 20 MG tablet      TAKE 1 TABLET (20 MG TOTAL) BY MOUTH EVERY EVENING.   30 tablet   5   . spironolactone (ALDACTONE) 25 MG tablet   Oral   Take 1 tablet (25 mg total) by mouth daily.   30 tablet   6     Allergies Review of patient's allergies indicates no known allergies.  Family History  Problem Relation Age of Onset  . Diabetes Mother   . Diabetes Father   . Alcohol abuse Father   . Heart disease Father   . Stroke Sister     Social History History  Substance Use Topics  . Smoking status: Current Every Day Smoker -- 1.00 packs/day    Types: Cigarettes  . Smokeless tobacco: Never Used  . Alcohol Use: No    Review of Systems  Constitutional: Negative for fever. Eyes: Negative for visual changes. ENT: Negative for sore throat. Cardiovascular: Negative for chest pain. Respiratory: Negative for shortness of breath.  Gastrointestinal: Negative for abdominal pain, vomiting and diarrhea. Genitourinary: Negative for dysuria. Musculoskeletal: Negative for back pain. Skin: Negative for rash. Neurological: Negative for headaches, focal weakness or numbness.   10-point ROS otherwise negative.  ____________________________________________   PHYSICAL EXAM:  VITAL SIGNS: ED Triage Vitals  Enc Vitals Group     BP 05/05/15 0103 201/110 mmHg     Pulse Rate 05/05/15 0103 88     Resp 05/05/15 0254 20     Temp 05/05/15 0103 98.1 F (36.7 C)     Temp src --      SpO2 05/05/15 0103 99 %     Weight 05/05/15 0103 230 lb (104.327 kg)     Height 05/05/15 0103 5\' 7"  (1.702 m)     Head Cir --      Peak Flow --      Pain Score 05/05/15 0103 8     Pain Loc --      Pain Edu? --      Excl. in [place]? --       Constitutional: Alert and oriented. Well appearing and in no distress. Eyes: Conjunctivae are normal. PERRL. Normal extraocular movements. ENT   Head: Normocephalic and atraumatic.   Nose: No congestion/rhinnorhea.   Mouth/Throat: Mucous membranes are moist.   Neck: No stridor. Cardiovascular: Normal rate, regular rhythm. Normal and symmetric distal pulses are present in all extremities. No murmurs, rubs, or gallops. Respiratory: Normal respiratory effort without tachypnea nor retractions. Breath sounds are clear and equal bilaterally. No wheezes/rales/rhonchi. Gastrointestinal: Soft and nontender. No distention. There is no CVA tenderness. Genitourinary: deferred Musculoskeletal: Nontender with normal range of motion in all extremities. No joint effusions.  No lower extremity tenderness nor edema. Neurologic:  Normal speech and language. No gross focal neurologic deficits are appreciated. Speech is normal.  Skin:  Skin is warm, dry and intact. No rash noted. Psychiatric: Mood and affect are normal. Speech and behavior are normal. Patient exhibits appropriate insight and judgment.  ____________________________________________      ____________________________________________   INITIAL IMPRESSION / ASSESSMENT AND PLAN / ED COURSE  Pertinent labs & imaging results that were available during my care of the patient were reviewed by me and considered in my medical decision making (see chart for details).  History of physical exam consistent with hypertension. Patient's blood pressure 170/103 when I presented to the bedside. As such patient received clonidine 0.1 mg tablet will be discharged home with primary care physician outpatient follow-up  ____________________________________________   FINAL CLINICAL IMPRESSION(S) / ED DIAGNOSES  Final diagnoses:  Uncontrolled hypertension      Gregor Hams, MD 05/05/15 [phone]

## 2015-05-20 ENCOUNTER — Other Ambulatory Visit: Payer: Self-pay | Admitting: *Deleted

## 2015-05-20 MED ORDER — CLONAZEPAM 1 MG PO TBDP: 1.0000 mg | ORAL_TABLET | Freq: Two times a day (BID) | ORAL | Status: DC | PRN

## 2015-05-20 NOTE — Telephone Encounter (Signed)
Last f/u appt 04/2015 

## 2015-05-20 NOTE — Telephone Encounter (Signed)
Rx called in to requested pharmacy 

## 2015-06-13 ENCOUNTER — Ambulatory Visit (INDEPENDENT_AMBULATORY_CARE_PROVIDER_SITE_OTHER): Payer: Self-pay | Admitting: Psychiatry

## 2015-06-13 ENCOUNTER — Encounter (HOSPITAL_COMMUNITY): Payer: Self-pay | Admitting: Psychiatry

## 2015-06-13 VITALS — BP 192/98 | HR 94 | Ht 67.0 in | Wt 231.2 lb

## 2015-06-13 DIAGNOSIS — F411 Generalized anxiety disorder: Secondary | ICD-10-CM

## 2015-06-13 DIAGNOSIS — F410 Panic disorder [episodic paroxysmal anxiety] without agoraphobia: Secondary | ICD-10-CM

## 2015-06-13 DIAGNOSIS — F329 Major depressive disorder, single episode, unspecified: Secondary | ICD-10-CM

## 2015-06-13 MED ORDER — SERTRALINE HCL 100 MG PO TABS: ORAL_TABLET | ORAL | Status: DC
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
## 2015-06-22 ENCOUNTER — Other Ambulatory Visit: Payer: Self-pay | Admitting: *Deleted

## 2015-06-22 MED ORDER — CLONAZEPAM 1 MG PO TBDP: 1.0000 mg | ORAL_TABLET | Freq: Two times a day (BID) | ORAL | Status: DC | PRN

## 2015-06-22 NOTE — Telephone Encounter (Signed)
Last f/u appt 08/2014 

## 2015-06-22 NOTE — Telephone Encounter (Signed)
Rx called in to requested pharmacy 

## 2015-07-11 ENCOUNTER — Telehealth: Payer: Self-pay

## 2015-07-11 NOTE — Telephone Encounter (Signed)
Request from [facility] , sent to [place] on 07/12/2015 .

## 2015-07-14 ENCOUNTER — Ambulatory Visit (HOSPITAL_COMMUNITY): Payer: Self-pay | Admitting: Clinical

## 2015-07-15 ENCOUNTER — Ambulatory Visit (HOSPITAL_COMMUNITY): Payer: Self-pay | Admitting: Psychiatry

## 2015-07-25 ENCOUNTER — Other Ambulatory Visit: Payer: Self-pay

## 2015-07-25 MED ORDER — CLONAZEPAM 1 MG PO TBDP: 1.0000 mg | ORAL_TABLET | Freq: Two times a day (BID) | ORAL | Status: DC | PRN

## 2015-07-25 NOTE — Telephone Encounter (Signed)
Pt left v/m requesting refill clonazepam to [facility] [address]; pt thinks pharmacy requested last week but no response and pt is out of med and request refill done today. Pt request cb. rx last printed # 60 on 06/22/15 and last seen for f/u on 04/25/15.Please advise.

## 2015-07-25 NOTE — Telephone Encounter (Signed)
Rx called in to requested pharmacy 

## 2015-07-25 NOTE — Telephone Encounter (Signed)
Pt has checked with [facility] and they do not have refill of clonazepam; spoke with Sybil at [address] and they do have refill authorization and will get med ready for pick up. Pt will call pharmacy before going to pick up med.

## 2015-08-09 ENCOUNTER — Ambulatory Visit (INDEPENDENT_AMBULATORY_CARE_PROVIDER_SITE_OTHER): Payer: Self-pay | Admitting: Clinical

## 2015-08-09 ENCOUNTER — Encounter (HOSPITAL_COMMUNITY): Payer: Self-pay | Admitting: Clinical

## 2015-08-09 DIAGNOSIS — F331 Major depressive disorder, recurrent, moderate: Secondary | ICD-10-CM

## 2015-08-09 DIAGNOSIS — F4310 Post-traumatic stress disorder, unspecified: Secondary | ICD-10-CM

## 2015-08-09 DIAGNOSIS — F4321 Adjustment disorder with depressed mood: Secondary | ICD-10-CM
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
## 2015-08-17 ENCOUNTER — Ambulatory Visit (INDEPENDENT_AMBULATORY_CARE_PROVIDER_SITE_OTHER): Payer: Self-pay | Admitting: Psychiatry

## 2015-08-17 ENCOUNTER — Encounter (HOSPITAL_COMMUNITY): Payer: Self-pay | Admitting: Psychiatry

## 2015-08-17 VITALS — BP 192/114 | HR 116 | Ht 67.0 in | Wt 225.2 lb

## 2015-08-17 DIAGNOSIS — F331 Major depressive disorder, recurrent, moderate: Secondary | ICD-10-CM

## 2015-08-17 DIAGNOSIS — F411 Generalized anxiety disorder: Secondary | ICD-10-CM

## 2015-08-17 DIAGNOSIS — F4310 Post-traumatic stress disorder, unspecified: Secondary | ICD-10-CM

## 2015-08-17 DIAGNOSIS — F410 Panic disorder [episodic paroxysmal anxiety] without agoraphobia: Secondary | ICD-10-CM

## 2015-08-17 MED ORDER — LAMOTRIGINE 25 MG PO TABS: ORAL_TABLET | ORAL | Status: DC

## 2015-08-17 MED ORDER — ESCITALOPRAM OXALATE 10 MG PO TABS: 10.0000 mg | ORAL_TABLET | Freq: Every day | ORAL | Status: DC
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
## 2015-08-22 ENCOUNTER — Ambulatory Visit: Payer: Self-pay | Admitting: Family Medicine

## 2015-09-01 ENCOUNTER — Telehealth: Payer: Self-pay | Admitting: Family Medicine

## 2015-09-01 ENCOUNTER — Ambulatory Visit: Payer: Self-pay | Admitting: Family Medicine

## 2015-09-01 NOTE — Telephone Encounter (Signed)
Pt did not come in for their appt today for acute visit for muscle pain. Please let me know if pt needs to be contacted immediately for follow up or no follow up needed. Best phone number to contact pt is [phone].

## 2015-09-13 ENCOUNTER — Other Ambulatory Visit: Payer: Self-pay

## 2015-09-13 NOTE — Telephone Encounter (Signed)
Pt left v/m; pt has been out of Klonopin for one month; pt request refill Klonopin to [facility] [address] ASAP. Pt request cb when refilled. Last refill # 60 on 07/25/15. Pt last seen 04/25/15. Please advise.

## 2015-09-14 MED ORDER — CLONAZEPAM 1 MG PO TBDP: 1.0000 mg | ORAL_TABLET | Freq: Two times a day (BID) | ORAL | Status: DC | PRN

## 2015-09-14 NOTE — Telephone Encounter (Signed)
Rx called in to requested pharmacy 

## 2015-09-14 NOTE — Telephone Encounter (Signed)
Pt left v/m requesting cb about Klonopin refill status.

## 2015-09-19 ENCOUNTER — Ambulatory Visit (INDEPENDENT_AMBULATORY_CARE_PROVIDER_SITE_OTHER): Payer: Self-pay | Admitting: Psychiatry

## 2015-09-19 ENCOUNTER — Encounter (HOSPITAL_COMMUNITY): Payer: Self-pay | Admitting: Psychiatry

## 2015-09-19 VITALS — BP 130/90 | HR 98 | Ht 67.0 in | Wt 232.0 lb

## 2015-09-19 DIAGNOSIS — F411 Generalized anxiety disorder: Secondary | ICD-10-CM

## 2015-09-19 DIAGNOSIS — F4310 Post-traumatic stress disorder, unspecified: Secondary | ICD-10-CM

## 2015-09-19 DIAGNOSIS — F331 Major depressive disorder, recurrent, moderate: Secondary | ICD-10-CM

## 2015-09-19 DIAGNOSIS — F410 Panic disorder [episodic paroxysmal anxiety] without agoraphobia: Secondary | ICD-10-CM

## 2015-09-19 MED ORDER — FLUOXETINE HCL 10 MG PO CAPS: ORAL_CAPSULE | ORAL | Status: DC
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
## 2015-09-27 ENCOUNTER — Telehealth (HOSPITAL_COMMUNITY): Payer: Self-pay

## 2015-09-27 ENCOUNTER — Other Ambulatory Visit (HOSPITAL_COMMUNITY): Payer: Self-pay | Admitting: Psychiatry

## 2015-09-27 ENCOUNTER — Telehealth: Payer: Self-pay

## 2015-09-27 NOTE — Telephone Encounter (Signed)
Pt has applied for social security disablility and was denied the first time; pt was advised to get letter from Dr Deborra Medina and behavorial health that pts hypertension, cluster headaches, anxiety and depression have affected pt's ability to work and why. Pt last seen 04/25/15. Financially pt would prefer letter without pt being seen again but pt said she would schedule appt if necessary to get letter for disability.pt request cb.

## 2015-09-27 NOTE — Telephone Encounter (Signed)
Medication management - States Social Securty application for disability was denied and her atorney informed her she needed documentation in a letter or record stating exactly how her illness afects her ability to work and maintain employment.  Patient would like Dr. Adele Schilder to write a letter as to how her illness affects her ability to work or should this be discussed and documented at next evaluation coming up 10/17/15?  Patient would like to know if Dr. Adele Schilder would be willing to write this in a letter or note or if they would need to discuss further?

## 2015-09-27 NOTE — Telephone Encounter (Signed)
Erika [name] please call to inform patient that she will need to be seen, and her specialist will need to write the letter.

## 2015-09-27 NOTE — Telephone Encounter (Signed)
We can send the records to attorney once she signed a release consent.

## 2015-09-27 NOTE — Telephone Encounter (Signed)
Called patient to inform of Dr. Marguerite Olea instruction and patient stated she had already spoken with 2 attorneys and they have told her the same about her chances for disability.  States she will request the records for herself to appeal denial and will discuss with Dr. Adele Schilder more at appointment on 10/17/15 and if he would then be willing to write a letter in support.

## 2015-09-27 NOTE — Telephone Encounter (Signed)
She would need to be seen.  I should also warn her that I unfortunately do not do long term disability and therefore do not typically write letters like this. I could write a letter stating that I am treating her for these issues, along with specialists such as Dr. Fletcher Anon.  The social security disability department should be able to put her in touch with physicians who do help with disability regularly.

## 2015-09-28 NOTE — Telephone Encounter (Signed)
Pt notified as instructed and pt voiced understanding and pt requested appt for letter for Dr Hulen Shouts opinion of her health status and and ability to work when having cluster h/a.

## 2015-09-28 NOTE — Telephone Encounter (Signed)
Noted.  Thank you for making the appointment.

## 2015-10-17 ENCOUNTER — Ambulatory Visit (HOSPITAL_COMMUNITY): Payer: Self-pay | Admitting: Psychiatry

## 2015-10-17 ENCOUNTER — Ambulatory Visit (INDEPENDENT_AMBULATORY_CARE_PROVIDER_SITE_OTHER): Payer: Self-pay | Admitting: Family Medicine

## 2015-10-17 ENCOUNTER — Encounter: Payer: Self-pay | Admitting: Family Medicine

## 2015-10-17 VITALS — BP 168/102 | HR 87 | Temp 98.1°F | Wt 219.5 lb

## 2015-10-17 DIAGNOSIS — F329 Major depressive disorder, single episode, unspecified: Secondary | ICD-10-CM | POA: Insufficient documentation

## 2015-10-17 DIAGNOSIS — Z23 Encounter for immunization: Secondary | ICD-10-CM

## 2015-10-17 DIAGNOSIS — I10 Essential (primary) hypertension: Secondary | ICD-10-CM

## 2015-10-17 DIAGNOSIS — F332 Major depressive disorder, recurrent severe without psychotic features: Secondary | ICD-10-CM

## 2015-10-17 DIAGNOSIS — F4001 Agoraphobia with panic disorder: Secondary | ICD-10-CM

## 2015-10-17 DIAGNOSIS — G44029 Chronic cluster headache, not intractable: Secondary | ICD-10-CM

## 2015-10-17 MED ORDER — VERAPAMIL HCL 120 MG PO TABS: 120.0000 mg | ORAL_TABLET | Freq: Three times a day (TID) | ORAL | Status: DC

## 2015-10-17 MED ORDER — ALPRAZOLAM 0.25 MG PO TABS: 0.2500 mg | ORAL_TABLET | Freq: Three times a day (TID) | ORAL | Status: DC | PRN

## 2015-10-17 NOTE — Assessment & Plan Note (Signed)
Deteriorated. >25 minutes spent in face to face time with patient, >50% spent in counselling or coordination of care discussing her high blood pressure, MDD, GAD and cluster headaches. BP poorly controlled as are her headaches.  Increase verapamil to 120 mg three times daily.  She will monitor BPs at home and call me.  Continue seeing psychiatry and note written stating that I have been managing her blood pressure and headaches and given to pt. The patient indicates understanding of these issues and agrees with the plan.

## 2015-10-17 NOTE — Progress Notes (Signed)
Subjective:   Patient ID: Erika [name], female    DOB: 11/03/1971, 44 y.o.   MRN: [id number]  Erika [name] is a pleasant 44 y.o. year old female who presents to clinic today with disability forms  on 10/17/2015  HPI   Applying for disability for MDD, anxiety and hypertension. HTN-  Taking Enalpril 20 mg daily, spironolactone 25 mg daily (started by cardiology), coreg 12.5 mg twice daily,  and Verapamil 80 mg three times daily.  Lab Results  Component Value Date   CREATININE 0.67 01/21/2015   MDD and GAD- followed by Psychiatry, Dr. Adele Schilder.  Was last seen on 9/26.  Note reviewed.  Lamictal and lexapro d/v'd and started on Prozac 20 mg daily.  Advised to get benzo refills from me.    Erika not have health insurance until January.  Trying to lose weight so she can control her blood pressure but it's not helping.  Cluster headaches are also worse.   Wt Readings from Last 3 Encounters:  10/17/15 219 lb 8 oz (99.565 kg)  09/19/15 232 lb (105.235 kg)  08/17/15 225 lb 3.2 oz (102.15 kg)     Current Outpatient Prescriptions on File Prior to Visit  Medication Sig Dispense Refill  . carvedilol (COREG) 12.5 MG tablet Take 1 tablet (12.5 mg total) by mouth 2 (two) times daily. 60 tablet 6  . clonazePAM (KLONOPIN) 1 MG disintegrating tablet Take 1 tablet (1 mg total) by mouth 2 (two) times daily as needed for seizure. 60 tablet 0  . enalapril (VASOTEC) 20 MG tablet Take 1 tablet (20 mg total) by mouth daily. 30 tablet 5  . FLUoxetine (PROZAC) 10 MG capsule Take 1 capsule for 1 week and than 2 capsule 60 capsule 0  . hydrochlorothiazide (HYDRODIURIL) 25 MG tablet TAKE 1 pill daily 30 tablet 2  . simvastatin (ZOCOR) 20 MG tablet TAKE 1 TABLET (20 MG TOTAL) BY MOUTH EVERY EVENING. 30 tablet 5  . spironolactone (ALDACTONE) 25 MG tablet Take 1 tablet (25 mg total) by mouth daily. 30 tablet 6  . verapamil (CALAN) 80 MG tablet Take 80 mg by mouth 3 (three) times daily.     No  current facility-administered medications on file prior to visit.    No Known Allergies  Past Medical History  Diagnosis Date  . Essential hypertension, benign   . Other and unspecified hyperlipidemia   . Unspecified essential hypertension   . Arthralgia of temporomandibular joint   . PTSD (post-traumatic stress disorder)   . Depression     Past Surgical History  Procedure Laterality Date  . Cholecystectomy    . Tonsillectomy    . Cesarean section      Family History  Problem Relation Age of Onset  . Adopted: Yes  . Diabetes Mother   . Diabetes Father   . Alcohol abuse Father   . Heart disease Father   . Drug abuse Father   . Stroke Sister   . Anxiety disorder Sister     Social History   Social History  . Marital Status: Married    Spouse Name: [name]  . Number of Children: [name]  . Years of Education: [name]   Occupational History  . Not on file.   Social History Main Topics  . Smoking status: Current Every Day Smoker -- 1.00 packs/day    Types: Cigarettes  . Smokeless tobacco: Never Used  . Alcohol Use: No  . Drug Use: No  . Sexual Activity: Not Currently  Birth Control/ Protection: None   Other Topics Concern  . Not on file   Social History Narrative   The PMH, PSH, Social History, Family History, Medications, and allergies have been reviewed in [facility], and have been updated if relevant.   Review of Systems  Constitutional: Negative.   HENT: Negative.   Eyes: Negative.   Respiratory: Negative for chest tightness and shortness of breath.   Cardiovascular: Negative for palpitations and leg swelling.  Gastrointestinal: Negative.   Endocrine: Negative.   Genitourinary: Negative.   Musculoskeletal: Negative.   Skin: Negative.   Allergic/Immunologic: Negative.   Neurological: Negative.   Hematological: Negative.   Psychiatric/Behavioral: Positive for dysphoric mood. Negative for suicidal ideas, sleep disturbance and agitation. The patient is  nervous/anxious.   All other systems reviewed and are negative.      Objective:    BP 168/102 mmHg  Pulse 87  Temp(Src) 98.1 F (36.7 C) (Oral)  Wt 219 lb 8 oz (99.565 kg)  SpO2 96%  LMP 10/11/2015  BP Readings from Last 3 Encounters:  10/17/15 168/102  09/19/15 130/90  08/17/15 192/114    Physical Exam  Constitutional: She is oriented to person, place, and time. She appears well-developed and well-nourished. No distress.  HENT:  Head: Normocephalic.  Eyes: Conjunctivae are normal.  Neck: Normal range of motion.  Cardiovascular: Normal rate and regular rhythm.   Pulmonary/Chest: Effort normal.  Abdominal: Soft.  Musculoskeletal: Normal range of motion.  Neurological: She is alert and oriented to person, place, and time.  Skin: Skin is warm and dry.  Psychiatric:  Tearful, anxious  Good eye contact, pleasant  Nursing note and vitals reviewed.         Assessment & Plan:   Need for influenza vaccination - Plan: Flu Vaccine QUAD 36+ mos PF IM (Fluarix & Fluzone Quad PF)  HTN (hypertension), malignant  Chronic cluster headache, not intractable  Agoraphobia with panic attacks  Severe episode of recurrent major depressive disorder, without psychotic features ([place]) No Follow-up on file.

## 2015-10-17 NOTE — Progress Notes (Signed)
Pre visit review using our clinic review tool, if applicable. No additional management support is needed unless otherwise documented below in the visit note. 

## 2015-10-17 NOTE — Patient Instructions (Signed)
Great to see you. We are increasing your Verapamil to 120 mg three times daily. Please keep me updated.  We are starting xanax.

## 2015-10-19 ENCOUNTER — Ambulatory Visit (HOSPITAL_COMMUNITY): Payer: Self-pay | Admitting: Psychiatry

## 2015-10-25 ENCOUNTER — Ambulatory Visit (HOSPITAL_COMMUNITY): Payer: Self-pay | Admitting: Psychiatry

## 2015-10-26 ENCOUNTER — Ambulatory Visit: Payer: Self-pay | Admitting: Family Medicine

## 2015-10-26 ENCOUNTER — Other Ambulatory Visit: Payer: Self-pay | Admitting: Family Medicine

## 2015-10-26 DIAGNOSIS — F331 Major depressive disorder, recurrent, moderate: Secondary | ICD-10-CM

## 2015-10-26 DIAGNOSIS — Z0289 Encounter for other administrative examinations: Secondary | ICD-10-CM

## 2015-10-26 NOTE — Telephone Encounter (Signed)
Pt did not come in for their appt today for follow up. Please let me know if pt needs to be contacted immediately for follow up or no follow up needed. Best phone number to contact pt is [phone].

## 2015-11-02 ENCOUNTER — Other Ambulatory Visit (HOSPITAL_COMMUNITY): Payer: Self-pay | Admitting: Psychiatry

## 2015-11-02 MED ORDER — FLUOXETINE HCL 10 MG PO CAPS: ORAL_CAPSULE | ORAL | Status: DC

## 2015-11-02 NOTE — Telephone Encounter (Signed)
Patient called needing Rx for prozac. Pt has appointment scheduled for 12/08/15.

## 2015-11-10 ENCOUNTER — Ambulatory Visit (HOSPITAL_COMMUNITY): Payer: Self-pay | Admitting: Psychiatry

## 2015-11-14 ENCOUNTER — Encounter: Payer: Self-pay | Admitting: Family Medicine

## 2015-11-15 ENCOUNTER — Other Ambulatory Visit: Payer: Self-pay | Admitting: Family Medicine

## 2015-11-15 MED ORDER — ALPRAZOLAM 0.5 MG PO TABS: 0.5000 mg | ORAL_TABLET | Freq: Three times a day (TID) | ORAL | Status: DC | PRN

## 2015-11-21 NOTE — Telephone Encounter (Signed)
Rx called in to requested pharmacy 

## 2015-11-30 ENCOUNTER — Emergency Department (HOSPITAL_COMMUNITY): Payer: Self-pay

## 2015-11-30 ENCOUNTER — Emergency Department (HOSPITAL_COMMUNITY)
Admission: EM | Admit: 2015-11-30 | Discharge: 2015-11-30 | Disposition: A | Discharge status: Home / Self Care | Payer: Self-pay | Attending: Emergency Medicine | Admitting: Emergency Medicine

## 2015-11-30 ENCOUNTER — Encounter (HOSPITAL_COMMUNITY): Payer: Self-pay | Admitting: Emergency Medicine

## 2015-11-30 ENCOUNTER — Encounter: Payer: Self-pay | Admitting: Family Medicine

## 2015-11-30 DIAGNOSIS — Z79899 Other long term (current) drug therapy: Secondary | ICD-10-CM | POA: Insufficient documentation

## 2015-11-30 DIAGNOSIS — F329 Major depressive disorder, single episode, unspecified: Secondary | ICD-10-CM | POA: Insufficient documentation

## 2015-11-30 DIAGNOSIS — I959 Hypotension, unspecified: Secondary | ICD-10-CM | POA: Insufficient documentation

## 2015-11-30 DIAGNOSIS — E785 Hyperlipidemia, unspecified: Secondary | ICD-10-CM | POA: Insufficient documentation

## 2015-11-30 DIAGNOSIS — M545 Low back pain: Secondary | ICD-10-CM | POA: Insufficient documentation

## 2015-11-30 DIAGNOSIS — I10 Essential (primary) hypertension: Secondary | ICD-10-CM | POA: Insufficient documentation

## 2015-11-30 DIAGNOSIS — F17210 Nicotine dependence, cigarettes, uncomplicated: Secondary | ICD-10-CM | POA: Insufficient documentation

## 2015-11-30 DIAGNOSIS — R001 Bradycardia, unspecified: Secondary | ICD-10-CM | POA: Insufficient documentation

## 2015-11-30 DIAGNOSIS — R0602 Shortness of breath: Secondary | ICD-10-CM | POA: Insufficient documentation

## 2015-11-30 LAB — CBC WITH DIFFERENTIAL/PLATELET
BASOS PCT: 0 %
Basophils Absolute: 0 10*3/uL (ref 0.0–0.1)
EOS ABS: 0.3 10*3/uL (ref 0.0–0.7)
EOS PCT: 2 %
HCT: 42.8 % (ref 36.0–46.0)
HEMOGLOBIN: 14.8 g/dL (ref 12.0–15.0)
Lymphocytes Relative: 21 %
Lymphs Abs: 3 10*3/uL (ref 0.7–4.0)
MCH: 30.5 pg (ref 26.0–34.0)
MCHC: 34.6 g/dL (ref 30.0–36.0)
MCV: 88.1 fL (ref 78.0–100.0)
Monocytes Absolute: 0.6 10*3/uL (ref 0.1–1.0)
Monocytes Relative: 4 %
NEUTROS PCT: 73 %
Neutro Abs: 10.8 10*3/uL — ABNORMAL HIGH (ref 1.7–7.7)
PLATELETS: 319 10*3/uL (ref 150–400)
RBC: 4.86 MIL/uL (ref 3.87–5.11)
RDW: 12.7 % (ref 11.5–15.5)
WBC: 14.7 10*3/uL — AB (ref 4.0–10.5)

## 2015-11-30 LAB — COMPREHENSIVE METABOLIC PANEL
ALBUMIN: 3.6 g/dL (ref 3.5–5.0)
ALT: 78 U/L — ABNORMAL HIGH (ref 14–54)
ANION GAP: 11 (ref 5–15)
AST: 113 U/L — ABNORMAL HIGH (ref 15–41)
Alkaline Phosphatase: 97 U/L (ref 38–126)
BUN: 12 mg/dL (ref 6–20)
CHLORIDE: 97 mmol/L — AB (ref 101–111)
CO2: 24 mmol/L (ref 22–32)
Calcium: 9.5 mg/dL (ref 8.9–10.3)
Creatinine, Ser: 1.18 mg/dL — ABNORMAL HIGH (ref 0.44–1.00)
GFR, EST NON AFRICAN AMERICAN: 56 mL/min — AB (ref >60)
Glucose, Bld: 282 mg/dL — ABNORMAL HIGH (ref 65–99)
POTASSIUM: 4 mmol/L (ref 3.5–5.1)
SODIUM: 132 mmol/L — AB (ref 135–145)
TOTAL PROTEIN: 7 g/dL (ref 6.5–8.1)
Total Bilirubin: 0.6 mg/dL (ref 0.3–1.2)

## 2015-11-30 LAB — TROPONIN I: Troponin I: <0.03 ng/mL (ref <0.031)

## 2015-11-30 LAB — URINALYSIS, ROUTINE W REFLEX MICROSCOPIC
BILIRUBIN URINE: NEGATIVE
Glucose, UA: 100 mg/dL — AB
HGB URINE DIPSTICK: NEGATIVE
Ketones, ur: NEGATIVE mg/dL
Leukocytes, UA: NEGATIVE
NITRITE: NEGATIVE
pH: 6 (ref 5.0–8.0)

## 2015-11-30 LAB — URINE MICROSCOPIC-ADD ON

## 2015-11-30 LAB — LACTIC ACID, PLASMA
LACTIC ACID, VENOUS: 2.3 mmol/L — AB (ref 0.5–2.0)
LACTIC ACID, VENOUS: 1.5 mmol/L (ref 0.5–2.0)

## 2015-11-30 MED ORDER — SODIUM CHLORIDE 0.9 % IV BOLUS (SEPSIS)
1000.0000 mL | Freq: Once | INTRAVENOUS | Status: AC
  Administered 2015-11-30: 1000 mL via INTRAVENOUS

## 2015-11-30 NOTE — ED Notes (Signed)
Pt states that she just does not feel well this morning.  States that she got up this morning and took her meds, drank a cup of coffee, and then had to chase the dog around outside.  Began to feel lightheaded, dizzy, with back pain.

## 2015-11-30 NOTE — ED Provider Notes (Signed)
CSN: [id number]     Arrival date & time 11/30/15  1009 History  By signing my name below, I, Erling Conte, attest that this documentation has been prepared under the direction and in the presence of Davonna Belling, MD. Electronically Signed: Erling Conte, ED Scribe. 11/30/2015. 12:45 PM.    Chief Complaint  Patient presents with  . Dizziness   The history is provided by the patient. No language interpreter was used.    HPI Comments: GESSICA KAHLER is a 44 y.o. female who presents to the Emergency Department complaining of intermittent, moderate, dizziness onset this morning. She reports associated blurred vision, neck pain and b/l shoulder pain. Pt notes she has been having exertional SOB for 1 year. She followed up with a cardiologist for this issue and they wanted to schedule a stress test but she was only able to go to one appt due lack of insurance and inability to pay. Pt took her medications this morning as prescribed. She denies any recent changes in her BP medications and she notes they slightly increased her Xanax dosage to 1 mg. She denies any chest pain, weakness or other associated symptoms.    Past Medical History  Diagnosis Date  . Essential hypertension, benign   . Other and unspecified hyperlipidemia   . Unspecified essential hypertension   . Arthralgia of temporomandibular joint   . PTSD (post-traumatic stress disorder)   . Depression    Past Surgical History  Procedure Laterality Date  . Cholecystectomy    . Tonsillectomy    . Cesarean section     Family History  Problem Relation Age of Onset  . Adopted: Yes  . Diabetes Mother   . Diabetes Father   . Alcohol abuse Father   . Heart disease Father   . Drug abuse Father   . Stroke Sister   . Anxiety disorder Sister    Social History  Substance Use Topics  . Smoking status: Current Every Day Smoker -- 1.00 packs/day    Types: Cigarettes  . Smokeless tobacco: Never Used  . Alcohol Use: No   OB  History    No data available     Review of Systems  Eyes: Positive for visual disturbance.  Respiratory: Positive for shortness of breath (w/ exertion).   Cardiovascular: Negative for chest pain.  Musculoskeletal: Positive for back pain.  Neurological: Positive for dizziness. Negative for weakness.      Allergies  Review of patient's allergies indicates no known allergies.  Home Medications   Prior to Admission medications   Medication Sig Start Date End Date Taking? Authorizing Provider  ALPRAZolam Duanne Moron) 0.5 MG tablet Take 1 tablet (0.5 mg total) by mouth 3 (three) times daily as needed for anxiety or sleep. 11/15/15  Yes Lucille Passy, MD  carvedilol (COREG) 12.5 MG tablet Take 1 tablet (12.5 mg total) by mouth 2 (two) times daily. 03/07/15  Yes Wellington Hampshire, MD  enalapril (VASOTEC) 20 MG tablet Take 1 tablet (20 mg total) by mouth daily. 04/25/15  Yes Lucille Passy, MD  FLUoxetine (PROZAC) 10 MG capsule Take 2 capsules daily 11/02/15  Yes Kathlee Nations, MD  hydrochlorothiazide (HYDRODIURIL) 25 MG tablet TAKE 1 pill daily 04/25/15  Yes Lucille Passy, MD  simvastatin (ZOCOR) 20 MG tablet TAKE 1 TABLET (20 MG TOTAL) BY MOUTH EVERY EVENING. 04/25/15  Yes Lucille Passy, MD  spironolactone (ALDACTONE) 25 MG tablet Take 1 tablet (25 mg total) by mouth daily. 02/09/15  Yes  Wellington Hampshire, MD  verapamil (CALAN) 120 MG tablet Take 1 tablet (120 mg total) by mouth 3 (three) times daily. 10/17/15  Yes Lucille Passy, MD   Triage Vitals: BP 85/49 mmHg  Pulse 58  Temp(Src) 97.8 F (36.6 C) (Oral)  Resp 18  Ht 5\' 7"  (1.702 m)  Wt 219 lb (99.338 kg)  BMI 34.29 kg/m2  LMP 11/24/2015  Physical Exam  Constitutional: She is oriented to person, place, and time. She appears well-developed and well-nourished. No distress.  HENT:  Head: Normocephalic and atraumatic.  Eyes: Conjunctivae and EOM are normal.  Neck: Neck supple. No tracheal deviation present.  Cardiovascular: Regular rhythm and normal  heart sounds.  Bradycardia present.   Mild bradycardia No peripheral edema  Pulmonary/Chest: Effort normal and breath sounds normal. No respiratory distress.  Abdominal: Soft. There is no tenderness.  Musculoskeletal: Normal range of motion.  Neurological: She is alert and oriented to person, place, and time.  Skin: Skin is warm and dry.  Psychiatric: She has a normal mood and affect. Her behavior is normal.  Nursing note and vitals reviewed.   ED Course  Procedures (including critical care time)   COORDINATION OF CARE:  10:27 AM- will order 12 lead EKG. Pt advised of plan for treatment and pt agrees.  Labs Review Labs Reviewed  COMPREHENSIVE METABOLIC PANEL - Abnormal; Notable for the following:    Sodium 132 (*)    Chloride 97 (*)    Glucose, Bld 282 (*)    Creatinine, Ser 1.18 (*)    AST 113 (*)    ALT 78 (*)    GFR calc non Af Amer 56 (*)    All other components within normal limits  LACTIC ACID, PLASMA - Abnormal; Notable for the following:    Lactic Acid, Venous 2.3 (*)    All other components within normal limits  CBC WITH DIFFERENTIAL/PLATELET - Abnormal; Notable for the following:    WBC 14.7 (*)    Neutro Abs 10.8 (*)    All other components within normal limits  URINALYSIS, ROUTINE W REFLEX MICROSCOPIC (NOT AT [facility]) - Abnormal; Notable for the following:    Specific Gravity, Urine <1.005 (*)    Glucose, UA 100 (*)    Protein, ur TRACE (*)    All other components within normal limits  URINE MICROSCOPIC-ADD ON - Abnormal; Notable for the following:    Squamous Epithelial / LPF 0-5 (*)    Bacteria, UA RARE (*)    All other components within normal limits  LACTIC ACID, PLASMA  TROPONIN I    Imaging Review Dg Chest 2 View  11/30/2015  CLINICAL DATA:  Dizziness, chest pain EXAM: CHEST  2 VIEW COMPARISON:  01/11/2015 FINDINGS: The heart size and mediastinal contours are within normal limits. Both lungs are clear. The visualized skeletal structures are  unremarkable. IMPRESSION: No active cardiopulmonary disease. Electronically Signed   By: Rolm Baptise M.D.   On: 11/30/2015 11:20   I have personally reviewed and evaluated these images and lab results as part of my medical decision-making.   EKG Interpretation   Date/Time:  Wednesday November 30 2015 10:13:56 EST Ventricular Rate:  58 PR Interval:  182 QRS Duration: 97 QT Interval:  472 QTC Calculation: 464 R Axis:   61 Text Interpretation:  Sinus rhythm Nonspecific ST and T wave abnormality  Confirmed by Alvino Chapel  MD, Juliett Eastburn [phone]) on 11/30/2015 10:26:38 AM      MDM   Final diagnoses:  Hypotension,  unspecified hypotension type    Patient resides with dizziness. Initially hypotensive. Has had some increase of her benzodiazepine's but otherwise stable on her medications. Slight upper back pain but no chest pain. No difficulty breathing. Blood pressure improved with IV fluids. Lactic acid has come down. Patient feels much better. Will discharge home. I personally performed the services described in this documentation, which was scribed in my presence. The recorded information has been reviewed and is accurate.       Davonna Belling, MD 11/30/15 [phone]

## 2015-11-30 NOTE — Discharge Instructions (Signed)
Hypotension  As your heart beats, it forces blood through your arteries. This force is your blood pressure. If your blood pressure is too low for you to go about your normal activities or to support the organs of your body, you have hypotension. Hypotension is also referred to as low blood pressure. When your blood pressure becomes too low, you may not get enough blood to your brain. As a result, you may feel weak, feel lightheaded, or develop a rapid heart rate. In a more severe case, you may faint.  CAUSES  Various conditions can cause hypotension. These include:  · Blood loss.  · Dehydration.  · Heart or endocrine problems.  · Pregnancy.  · Severe infection.  · Not having a well-balanced diet filled with needed nutrients.  · Severe allergic reactions (anaphylaxis).  Some medicines, such as blood pressure medicine or water pills (diuretics), may lower your blood pressure below normal. Sometimes taking too much medicine or taking medicine not as directed can cause hypotension.  TREATMENT   Hospitalization is sometimes required for hypotension if fluid or blood replacement is needed, if time is needed for medicines to wear off, or if further monitoring is needed. Treatment might include changing your diet, changing your medicines (including medicines aimed at raising your blood pressure), and use of support stockings.  HOME CARE INSTRUCTIONS   · Drink enough fluids to keep your urine clear or pale yellow.  · Take your medicines as directed by your health care provider.  · Get up slowly from reclining or sitting positions. This gives your blood pressure a chance to adjust.  · Wear support stockings as directed by your health care provider.  · Maintain a healthy diet by including nutritious food, such as fruits, vegetables, nuts, whole grains, and lean meats.  SEEK MEDICAL CARE IF:  · You have vomiting or diarrhea.  · You have a fever for more than 2-3 days.  · You feel more thirsty than usual.  · You feel weak and  tired.  SEEK IMMEDIATE MEDICAL CARE IF:   · You have chest pain or a fast or irregular heartbeat.  · You have a loss of feeling in some part of your body, or you lose movement in your arms or legs.  · You have trouble speaking.  · You become sweaty or feel lightheaded.  · You faint.  MAKE SURE YOU:   · Understand these instructions.  · Will watch your condition.  · Will get help right away if you are not doing well or get worse.     This information is not intended to replace advice given to you by your health care provider. Make sure you discuss any questions you have with your health care provider.     Document Released: 12/10/2005 Document Revised: 09/30/2013 Document Reviewed: 06/12/2013  Elsevier Interactive Patient Education ©2016 [employer]

## 2015-11-30 NOTE — ED Notes (Signed)
Pt states that she cannot provide a urine at this time.

## 2015-11-30 NOTE — ED Notes (Signed)
[facility]- LAB called with critical result of Lactic Acid - 2.3  Dr. Alvino Chapel made aware.

## 2015-12-08 ENCOUNTER — Ambulatory Visit: Payer: Self-pay | Admitting: Family Medicine

## 2015-12-08 ENCOUNTER — Ambulatory Visit (INDEPENDENT_AMBULATORY_CARE_PROVIDER_SITE_OTHER): Payer: Self-pay | Admitting: Family Medicine

## 2015-12-08 ENCOUNTER — Other Ambulatory Visit: Payer: Self-pay | Admitting: Family Medicine

## 2015-12-08 ENCOUNTER — Ambulatory Visit (HOSPITAL_COMMUNITY): Payer: Self-pay | Admitting: Psychiatry

## 2015-12-08 ENCOUNTER — Encounter: Payer: Self-pay | Admitting: Family Medicine

## 2015-12-08 VITALS — BP 194/112 | HR 87 | Temp 97.3°F | Wt 217.0 lb

## 2015-12-08 DIAGNOSIS — E872 Acidosis, unspecified: Secondary | ICD-10-CM | POA: Insufficient documentation

## 2015-12-08 DIAGNOSIS — F332 Major depressive disorder, recurrent severe without psychotic features: Secondary | ICD-10-CM

## 2015-12-08 DIAGNOSIS — R42 Dizziness and giddiness: Secondary | ICD-10-CM

## 2015-12-08 DIAGNOSIS — I10 Essential (primary) hypertension: Secondary | ICD-10-CM

## 2015-12-08 LAB — COMPREHENSIVE METABOLIC PANEL
ALBUMIN: 3.9 g/dL (ref 3.5–5.2)
ALK PHOS: 90 U/L (ref 39–117)
ALT: 35 U/L (ref 0–35)
AST: 23 U/L (ref 0–37)
BILIRUBIN TOTAL: 0.3 mg/dL (ref 0.2–1.2)
BUN: 9 mg/dL (ref 6–23)
CALCIUM: 10.5 mg/dL (ref 8.4–10.5)
CO2: 30 mEq/L (ref 19–32)
Chloride: 98 mEq/L (ref 96–112)
Creatinine, Ser: 0.63 mg/dL (ref 0.40–1.20)
GFR: 110.14 mL/min (ref >60.00)
Glucose, Bld: 245 mg/dL — ABNORMAL HIGH (ref 70–99)
Potassium: 4.1 mEq/L (ref 3.5–5.1)
Sodium: 134 mEq/L — ABNORMAL LOW (ref 135–145)
TOTAL PROTEIN: 7.7 g/dL (ref 6.0–8.3)

## 2015-12-08 LAB — TSH: TSH: 2.25 u[IU]/mL (ref 0.35–4.50)

## 2015-12-08 LAB — HEMOGLOBIN A1C: HEMOGLOBIN A1C: 9.9 % — AB (ref 4.6–6.5)

## 2015-12-08 MED ORDER — METFORMIN HCL 500 MG PO TABS: 500.0000 mg | ORAL_TABLET | Freq: Every day | ORAL | Status: DC

## 2015-12-08 NOTE — Assessment & Plan Note (Signed)
New- ? Unclear etiology. ? Possibility of pheochromocytoma? Recheck labs today, 24 hour urine catecholamines. The patient indicates understanding of these issues and agrees with the plan.

## 2015-12-08 NOTE — Progress Notes (Signed)
Subjective:   Patient ID: Erika [name], female    DOB: [birth year], 44 y.o.   MRN: [id number]  SHYVONNE CHASTANG is a pleasant 44 y.o. year old female who presents to clinic today with Hospitalization Follow-up  on 12/08/2015  HPI:  Notes reviewed- Presented to [facility] on 11/30/15 with acute onset of intermittent dizziness with associated neck, shoulder, upper back pain.  Initially hypotensive which is not typical for her, she is typically hypertensive.  Had not taken more or any new medications.  Had not been recently ill.  Has been sweating more.  troponins neg.  EKG, CXR unremarkable. Lactic acid elevated.  No longer dizzy but still having muscle aches.  Dg Chest 2 View  11/30/2015  CLINICAL DATA:  Dizziness, chest pain EXAM: CHEST  2 VIEW COMPARISON:  01/11/2015 FINDINGS: The heart size and mediastinal contours are within normal limits. Both lungs are clear. The visualized skeletal structures are unremarkable. IMPRESSION: No active cardiopulmonary disease. Electronically Signed   By: Rolm Baptise M.D.   On: 11/30/2015 11:20   Recent Results (from the past 2160 hour(s))  Comprehensive metabolic panel     Status: Abnormal   Collection Time: 11/30/15 10:39 AM  Result Value Ref Range   Sodium 132 (L) 135 - 145 mmol/L   Potassium 4.0 3.5 - 5.1 mmol/L   Chloride 97 (L) 101 - 111 mmol/L   CO2 24 22 - 32 mmol/L   Glucose, Bld 282 (H) 65 - 99 mg/dL   BUN 12 6 - 20 mg/dL   Creatinine, Ser 1.18 (H) 0.44 - 1.00 mg/dL   Calcium 9.5 8.9 - 10.3 mg/dL   Total Protein 7.0 6.5 - 8.1 g/dL   Albumin 3.6 3.5 - 5.0 g/dL   AST 113 (H) 15 - 41 U/L   ALT 78 (H) 14 - 54 U/L   Alkaline Phosphatase 97 38 - 126 U/L   Total Bilirubin 0.6 0.3 - 1.2 mg/dL   GFR calc non Af Amer 56 (L) >60 mL/min   GFR calc Af Amer >60 >60 mL/min    Comment: (NOTE) The eGFR has been calculated using the CKD EPI equation. This calculation has not been validated in all clinical situations. eGFR's  persistently <60 mL/min signify possible Chronic Kidney Disease.    Anion gap 11 5 - 15  Lactic acid, plasma     Status: Abnormal   Collection Time: 11/30/15 10:39 AM  Result Value Ref Range   Lactic Acid, Venous 2.3 (HH) 0.5 - 2.0 mmol/L    Comment: CRITICAL RESULT CALLED TO, READ BACK BY AND VERIFIED WITH: WALLACE,L AT 11:25AM ON 11/29/15 BY FESTERMAN,C   Troponin I     Status: None   Collection Time: 11/30/15 10:39 AM  Result Value Ref Range   Troponin I <0.03 <0.031 ng/mL    Comment:        NO INDICATION OF MYOCARDIAL INJURY.   CBC with Differential     Status: Abnormal   Collection Time: 11/30/15 10:39 AM  Result Value Ref Range   WBC 14.7 (H) 4.0 - 10.5 K/uL   RBC 4.86 3.87 - 5.11 MIL/uL   Hemoglobin 14.8 12.0 - 15.0 g/dL   HCT 42.8 36.0 - 46.0 %   MCV 88.1 78.0 - 100.0 fL   MCH 30.5 26.0 - 34.0 pg   MCHC 34.6 30.0 - 36.0 g/dL   RDW 12.7 11.5 - 15.5 %   Platelets 319 150 - 400 K/uL   Neutrophils Relative %  73 %   Neutro Abs 10.8 (H) 1.7 - 7.7 K/uL   Lymphocytes Relative 21 %   Lymphs Abs 3.0 0.7 - 4.0 K/uL   Monocytes Relative 4 %   Monocytes Absolute 0.6 0.1 - 1.0 K/uL   Eosinophils Relative 2 %   Eosinophils Absolute 0.3 0.0 - 0.7 K/uL   Basophils Relative 0 %   Basophils Absolute 0.0 0.0 - 0.1 K/uL  Urinalysis, Routine w reflex microscopic     Status: Abnormal   Collection Time: 11/30/15  1:15 PM  Result Value Ref Range   Color, Urine YELLOW YELLOW   APPearance CLEAR CLEAR   Specific Gravity, Urine <1.005 (L) 1.005 - 1.030   pH 6.0 5.0 - 8.0   Glucose, UA 100 (A) NEGATIVE mg/dL   Hgb urine dipstick NEGATIVE NEGATIVE   Bilirubin Urine NEGATIVE NEGATIVE   Ketones, ur NEGATIVE NEGATIVE mg/dL   Protein, ur TRACE (A) NEGATIVE mg/dL   Nitrite NEGATIVE NEGATIVE   Leukocytes, UA NEGATIVE NEGATIVE  Urine microscopic-add on     Status: Abnormal   Collection Time: 11/30/15  1:15 PM  Result Value Ref Range   Squamous Epithelial / LPF 0-5 (A) NONE SEEN   WBC, UA  0-5 0 - 5 WBC/hpf   RBC / HPF 0-5 0 - 5 RBC/hpf   Bacteria, UA RARE (A) NONE SEEN  Lactic acid, plasma     Status: None   Collection Time: 11/30/15  1:25 PM  Result Value Ref Range   Lactic Acid, Venous 1.5 0.5 - 2.0 mmol/L        Review of Systems  Constitutional: Negative.   Eyes: Negative.   Respiratory: Negative.   Cardiovascular: Negative.   Gastrointestinal: Positive for abdominal distention.  Endocrine: Positive for polyuria.  Musculoskeletal: Positive for back pain, arthralgias and neck stiffness.  Skin: Negative.   Neurological: Positive for dizziness and headaches. Negative for tremors, seizures, syncope, facial asymmetry, speech difficulty, weakness, light-headedness and numbness.  Psychiatric/Behavioral: The patient is nervous/anxious.   All other systems reviewed and are negative.      Objective:    BP 194/112 mmHg  Pulse 87  Temp(Src) 97.3 F (36.3 C) (Oral)  Wt 217 lb (98.431 kg)  SpO2 98%  LMP 11/24/2015   Physical Exam  Constitutional: She is oriented to person, place, and time. She appears well-developed and well-nourished. No distress.  HENT:  Head: Normocephalic.  Eyes: Conjunctivae are normal.  Cardiovascular: Normal rate and regular rhythm.   Pulmonary/Chest: Effort normal.  Musculoskeletal: Normal range of motion.  Neurological: She is alert and oriented to person, place, and time. No cranial nerve deficit.  Skin: Skin is warm and dry. She is not diaphoretic.  Psychiatric: She has a normal mood and affect. Her behavior is normal. Judgment and thought content normal.  Nursing note and vitals reviewed.         Assessment & Plan:   Dizziness and giddiness - Plan: Catecholamines, fractionated, urine, 24 hour, Comprehensive metabolic panel, TSH, Lactic acid, plasma, Cortisol-am, blood, Hemoglobin A1c  HTN (hypertension), malignant  Severe episode of recurrent major depressive disorder, without psychotic features ([facility])  Lactic acidosis  - Plan: Catecholamines, fractionated, urine, 24 hour, Comprehensive metabolic panel, TSH, Lactic acid, plasma, Cortisol-am, blood, Hemoglobin A1c No Follow-up on file.

## 2015-12-08 NOTE — Addendum Note (Signed)
Addended by: Ellamae Sia on: 12/08/2015 02:12 PM   Modules accepted: Orders

## 2015-12-08 NOTE — Assessment & Plan Note (Signed)
Currently asymptomatic, intermittent.

## 2015-12-08 NOTE — Assessment & Plan Note (Signed)
Deteriorated- very elevated in office but did not take rxs today on purpose- wanted me to see her "at baseline."  She will take rxs when she gets home.

## 2015-12-09 LAB — CORTISOL-AM, BLOOD: Cortisol - AM: 14 ug/dL (ref 4.3–22.4)

## 2015-12-09 LAB — LACTIC ACID, PLASMA: LACTIC ACID: 1.6 mmol/L (ref 0.5–2.2)

## 2015-12-12 NOTE — Addendum Note (Signed)
Addended by: Ellamae Sia on: 12/12/2015 08:19 AM   Modules accepted: Orders

## 2015-12-13 ENCOUNTER — Encounter: Payer: Self-pay | Admitting: Family Medicine

## 2015-12-14 ENCOUNTER — Other Ambulatory Visit: Payer: Self-pay | Admitting: Family Medicine

## 2015-12-14 MED ORDER — ALPRAZOLAM 0.5 MG PO TABS: 0.5000 mg | ORAL_TABLET | Freq: Three times a day (TID) | ORAL | Status: DC | PRN

## 2015-12-14 NOTE — Telephone Encounter (Signed)
Pt is currently seeing behavioral med. Pls advise

## 2015-12-15 NOTE — Telephone Encounter (Signed)
Rx called in to requested pharmacy 

## 2015-12-16 LAB — CATECHOLAMINES, FRACTIONATED, URINE, 24 HOUR
Calculated Total (E+NE): 71 mcg/24 h (ref 26–121)
Creatinine, Urine mg/day-CATEUR: 2.17 g/(24.h) (ref 0.63–2.50)
Dopamine, 24 hr Urine: 355 mcg/24 h (ref 52–480)
NOREPINEPHRINE, 24 HR UR: 71 ug/(24.h) (ref 15–100)
TOTAL VOLUME - CF 24HR U: 2300 mL

## 2015-12-22 ENCOUNTER — Encounter: Payer: Self-pay | Admitting: Family Medicine

## 2015-12-25 DIAGNOSIS — E119 Type 2 diabetes mellitus without complications: Secondary | ICD-10-CM

## 2015-12-25 DIAGNOSIS — I219 Acute myocardial infarction, unspecified: Secondary | ICD-10-CM

## 2015-12-25 HISTORY — DX: Acute myocardial infarction, unspecified: I21.9

## 2015-12-25 HISTORY — DX: Type 2 diabetes mellitus without complications: E11.9

## 2016-01-10 ENCOUNTER — Encounter (HOSPITAL_COMMUNITY): Payer: Self-pay | Admitting: Psychiatry

## 2016-01-10 ENCOUNTER — Ambulatory Visit (INDEPENDENT_AMBULATORY_CARE_PROVIDER_SITE_OTHER): Payer: BLUE CROSS/BLUE SHIELD | Admitting: Psychiatry

## 2016-01-10 VITALS — BP 170/98 | HR 86 | Ht 67.0 in | Wt 219.0 lb

## 2016-01-10 DIAGNOSIS — F411 Generalized anxiety disorder: Secondary | ICD-10-CM | POA: Diagnosis not present

## 2016-01-10 DIAGNOSIS — F4310 Post-traumatic stress disorder, unspecified: Secondary | ICD-10-CM | POA: Diagnosis not present

## 2016-01-10 DIAGNOSIS — F331 Major depressive disorder, recurrent, moderate: Secondary | ICD-10-CM | POA: Diagnosis not present

## 2016-01-10 MED ORDER — CLONAZEPAM 0.5 MG PO TABS: 0.5000 mg | ORAL_TABLET | Freq: Two times a day (BID) | ORAL | Status: DC

## 2016-01-10 MED ORDER — LAMOTRIGINE 100 MG PO TABS: ORAL_TABLET | ORAL | Status: DC

## 2016-01-10 MED ORDER — FLUOXETINE HCL 20 MG PO CAPS: 20.0000 mg | ORAL_CAPSULE | Freq: Every day | ORAL | Status: DC

## 2016-01-11 ENCOUNTER — Encounter (HOSPITAL_COMMUNITY): Payer: Self-pay | Admitting: Psychiatry
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
## 2016-01-12 ENCOUNTER — Other Ambulatory Visit: Payer: Self-pay | Admitting: Family Medicine

## 2016-01-12 MED ORDER — SPIRONOLACTONE 25 MG PO TABS: 25.0000 mg | ORAL_TABLET | Freq: Every day | ORAL | Status: DC

## 2016-01-12 MED ORDER — ENALAPRIL MALEATE 20 MG PO TABS: 20.0000 mg | ORAL_TABLET | Freq: Every day | ORAL | Status: DC

## 2016-01-12 MED ORDER — CARVEDILOL 12.5 MG PO TABS: 12.5000 mg | ORAL_TABLET | Freq: Two times a day (BID) | ORAL | Status: DC

## 2016-01-12 MED ORDER — VERAPAMIL HCL 120 MG PO TABS: 120.0000 mg | ORAL_TABLET | Freq: Three times a day (TID) | ORAL | Status: DC

## 2016-01-12 MED ORDER — HYDROCHLOROTHIAZIDE 25 MG PO TABS: ORAL_TABLET | ORAL | Status: DC

## 2016-02-02 ENCOUNTER — Encounter (HOSPITAL_COMMUNITY): Payer: Self-pay | Admitting: Psychiatry

## 2016-02-02 ENCOUNTER — Ambulatory Visit (INDEPENDENT_AMBULATORY_CARE_PROVIDER_SITE_OTHER): Payer: BLUE CROSS/BLUE SHIELD | Admitting: Psychiatry

## 2016-02-02 DIAGNOSIS — F4310 Post-traumatic stress disorder, unspecified: Secondary | ICD-10-CM

## 2016-02-02 DIAGNOSIS — F331 Major depressive disorder, recurrent, moderate: Secondary | ICD-10-CM | POA: Diagnosis not present

## 2016-02-02 NOTE — Patient Instructions (Signed)
Discussed orally 
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
## 2016-02-06 ENCOUNTER — Encounter: Payer: Self-pay | Admitting: Family Medicine

## 2016-02-08 ENCOUNTER — Encounter (HOSPITAL_COMMUNITY): Payer: Self-pay | Admitting: Psychiatry

## 2016-02-08 ENCOUNTER — Ambulatory Visit (INDEPENDENT_AMBULATORY_CARE_PROVIDER_SITE_OTHER): Payer: BLUE CROSS/BLUE SHIELD | Admitting: Psychiatry

## 2016-02-08 VITALS — BP 197/130 | HR 98 | Ht 67.5 in | Wt 215.6 lb

## 2016-02-08 DIAGNOSIS — F331 Major depressive disorder, recurrent, moderate: Secondary | ICD-10-CM | POA: Diagnosis not present

## 2016-02-08 DIAGNOSIS — F410 Panic disorder [episodic paroxysmal anxiety] without agoraphobia: Secondary | ICD-10-CM

## 2016-02-08 DIAGNOSIS — F4310 Post-traumatic stress disorder, unspecified: Secondary | ICD-10-CM | POA: Diagnosis not present

## 2016-02-08 DIAGNOSIS — F411 Generalized anxiety disorder: Secondary | ICD-10-CM

## 2016-02-08 MED ORDER — CLONAZEPAM 0.5 MG PO TABS: 0.5000 mg | ORAL_TABLET | Freq: Three times a day (TID) | ORAL | Status: DC

## 2016-02-08 MED ORDER — FLUOXETINE HCL 20 MG PO CAPS: 20.0000 mg | ORAL_CAPSULE | Freq: Every day | ORAL | Status: DC
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
## 2016-02-21 ENCOUNTER — Encounter (HOSPITAL_COMMUNITY): Payer: Self-pay | Admitting: Psychiatry

## 2016-02-21 ENCOUNTER — Ambulatory Visit (INDEPENDENT_AMBULATORY_CARE_PROVIDER_SITE_OTHER): Payer: BLUE CROSS/BLUE SHIELD | Admitting: Psychiatry

## 2016-02-21 DIAGNOSIS — F331 Major depressive disorder, recurrent, moderate: Secondary | ICD-10-CM

## 2016-02-21 NOTE — Patient Instructions (Signed)
Discussed orally 

## 2016-02-21 NOTE — Progress Notes (Signed)
   THERAPIST PROGRESS NOTE  Session Time:   Tuesday 02/21/2016 10:10 AM - 10:59 AM  Participation Level: Active  Behavioral Response: CasualAlertAngry, Anxious and Depressed  Type of Therapy: Individual Therapy  Treatment Goals addressed: Establish therapeutic alliance, learn and implement calming skills to reduce and manage overall anxiety  Interventions: Supportive  Summary: Erika [name] is a 45 y.o. female who presents with a long standing history of symptoms of anxiety and depression. She reports increased emotionality and anger outbursts since Thanksgiving. Cuuent stress includes father who has dementia being in the hospital. His health is declining rapidly. She is having conflict with sister about future plans for father's continued care. She reports financial stress as husband is the only one who works. They are behind on bills. Patient also reports stress related to multiple health issues.  Symptoms include anxiety, depressed mood, worry, panic attacks, explosive anger outbursts, and insomnia.  Patient reports increased stress since last session. Her father died on 02/28/2016.  She reports additional stress related to family isues with sibling regarding final arrangements and settlement of father's estate. Patient reports feeling abandoned and excluded. Father's death also has triggered issues related to previous losses of mother and and nephew. Patient reports support from husband, a half-sibling, and friends. She continues to experience anxiety, depressed mood, worry, panic attacks, and insomnia.     Suicidal/Homicidal: No  Therapist Response: Therapist works with patient to establish rapport, process grief/loss issues, identify support system, identify ways to improve self-care, discuss rationale for and practice controlled breathing.   Plan: Return again in 1-2 weeks.  Diagnosis: Axis I: MDD    Axis II: No diagnosis    Apolo Cutshaw, LCSW 02/21/2016

## 2016-02-23 ENCOUNTER — Telehealth: Payer: Self-pay | Admitting: Cardiovascular Disease

## 2016-02-23 NOTE — Telephone Encounter (Signed)
S/w pt who requests stress echo that was ordered March 2016. Recent BP readings in epic are elevated.  Informed pt we are unable to do test until BP better controlled. Pt will also need OV before stress echo as it was ordered last March. Pt verbalized understanding and is agreeable w/plan. She will see Dr. Fletcher Anon March 3

## 2016-02-24 ENCOUNTER — Ambulatory Visit (INDEPENDENT_AMBULATORY_CARE_PROVIDER_SITE_OTHER): Payer: BLUE CROSS/BLUE SHIELD | Admitting: Cardiovascular Disease

## 2016-02-24 ENCOUNTER — Encounter: Payer: Self-pay | Admitting: Cardiovascular Disease

## 2016-02-24 VITALS — BP 170/108 | HR 87 | Ht 67.0 in | Wt 214.2 lb

## 2016-02-24 DIAGNOSIS — R0602 Shortness of breath: Secondary | ICD-10-CM | POA: Diagnosis not present

## 2016-02-24 DIAGNOSIS — I159 Secondary hypertension, unspecified: Secondary | ICD-10-CM | POA: Diagnosis not present

## 2016-02-24 DIAGNOSIS — R079 Chest pain, unspecified: Secondary | ICD-10-CM | POA: Diagnosis not present

## 2016-02-24 MED ORDER — CARVEDILOL 25 MG PO TABS: 25.0000 mg | ORAL_TABLET | Freq: Two times a day (BID) | ORAL | Status: DC

## 2016-02-24 NOTE — Patient Instructions (Addendum)
Medication Instructions: Your physician has recommended you make the following change in your medication:  STOP taking spironolactone  INCREASE coreg to 25mg  twice daily    Labwork: Aldosterone/renin ratio in one week   Procedures/Testing: Your physician has requested that you have an echocardiogram. Echocardiography is a painless test that uses sound waves to create images of your heart. It provides your doctor with information about the size and shape of your heart and how well your heart's chambers and valves are working. This procedure takes approximately one hour. There are no restrictions for this procedure.  Your physician has requested that you have a renal artery duplex. During this test, an ultrasound is used to evaluate blood flow to the kidneys. Allow one hour for this exam. Do not eat after midnight the day before and avoid carbonated beverages. Take your medications as you usually do.     Follow-Up: Your physician recommends that you schedule a follow-up appointment after your tests.    Any Additional Special Instructions Will Be Listed Below (If Applicable).  We will check for renal artery stenosis and hyperaldosteronism.    If you need a refill on your cardiac medications before your next appointment, please call your pharmacy.  Echocardiogram An echocardiogram, or echocardiography, uses sound waves (ultrasound) to produce an image of your heart. The echocardiogram is simple, painless, obtained within a short period of time, and offers valuable information to your health care provider. The images from an echocardiogram can provide information such as:  Evidence of coronary artery disease (CAD).  Heart size.  Heart muscle function.  Heart valve function.  Aneurysm detection.  Evidence of a past heart attack.  Fluid buildup around the heart.  Heart muscle thickening.  Assess heart valve function. LET [facility] CARE PROVIDER KNOW ABOUT:  Any  allergies you have.  All medicines you are taking, including vitamins, herbs, eye drops, creams, and over-the-counter medicines.  Previous problems you or members of your family have had with the use of anesthetics.  Any blood disorders you have.  Previous surgeries you have had.  Medical conditions you have.  Possibility of pregnancy, if this applies. BEFORE THE PROCEDURE  No special preparation is needed. Eat and drink normally.  PROCEDURE   In order to produce an image of your heart, gel will be applied to your chest and a wand-like tool (transducer) will be moved over your chest. The gel will help transmit the sound waves from the transducer. The sound waves will harmlessly bounce off your heart to allow the heart images to be captured in real-time motion. These images will then be recorded.  You may need an IV to receive a medicine that improves the quality of the pictures. AFTER THE PROCEDURE You may return to your normal schedule including diet, activities, and medicines, unless your health care provider tells you otherwise.   This information is not intended to replace advice given to you by your health care provider. Make sure you discuss any questions you have with your health care provider.   Document Released: 12/07/2000 Document Revised: 12/31/2014 Document Reviewed: 08/17/2013 Elsevier Interactive Patient Education [employer].

## 2016-02-24 NOTE — Progress Notes (Signed)
Cardiology Office Note   Date:  02/24/2016   ID:  Erika [name], DOB [birth year], MRN [id number]  PCP:  Arnette Norris, MD Cardiologist:   [name], MD   Chief Complaint  Patient presents with  . other    Pt. c/o shortness of breath and chest pain. Meds reviewed by the patient verbally.       History of Present Illness: Erika [name] is a 45 y.o. female who presents for a follow-up visit regarding refractory hypertension and chest pain. She She has history of hypertension for 18 years. Previous nephrology work up in 2-14 by Dr. Holley Raring was negative.    I saw her last year for this issue but she missed her follow-up.  I added spironolactone and carvedilol and decreased verapamil. Hydralazine was discontinued. Her blood pressure initially was controlled but then she lost to follow-up. She suffers from significant anxiety and depression. Her blood pressure gradually started increasing last year and became uncontrolled again. She had one hospitalization for traumatic hypotension and bradycardia with borderline elevated lactic acid. She followed up with Dr. Deborra Medina.  workup for pheochromocytoma was negative. The patient continues to have elevated blood pressure readings complain of exertional dyspnea and occasional chest pain. She reports that she does not miss any medications.  when I asked her about the incident of hypotension and bradycardia that required emergency room visit, she mentioned that she might have taken extra doses of medications. She does not take any NSAIDs or decongestants.     Past Medical History  Diagnosis Date  . Essential hypertension, benign   . Other and unspecified hyperlipidemia   . Unspecified essential hypertension   . Arthralgia of temporomandibular joint   . PTSD (post-traumatic stress disorder)   . Depression   . Fatty liver disease, nonalcoholic [id number]  . Type 2 diabetes mellitus [facility]) January 2017    Past Surgical History  Procedure  Laterality Date  . Cholecystectomy    . Tonsillectomy    . Cesarean section       Current Outpatient Prescriptions  Medication Sig Dispense Refill  . clonazePAM (KLONOPIN) 0.5 MG tablet Take 1 tablet (0.5 mg total) by mouth 3 (three) times daily. 90 tablet 0  . enalapril (VASOTEC) 20 MG tablet Take 1 tablet (20 mg total) by mouth daily. 30 tablet 5  . FLUoxetine (PROZAC) 20 MG capsule Take 1 capsule (20 mg total) by mouth daily. 30 capsule 0  . hydrochlorothiazide (HYDRODIURIL) 25 MG tablet TAKE 1 pill daily 30 tablet 2  . lamoTRIgine (LAMICTAL) 100 MG tablet Take 1/2 tab daily for 1 week aned than 1 tab daily (Patient taking differently: Take 100 mg by mouth daily. ) 30 tablet 0  . metFORMIN (GLUCOPHAGE) 500 MG tablet Take 1 tablet (500 mg total) by mouth daily with breakfast. (Patient taking differently: Take 500 mg by mouth 2 (two) times daily with a meal. ) 30 tablet 3  . simvastatin (ZOCOR) 20 MG tablet TAKE 1 TABLET (20 MG TOTAL) BY MOUTH EVERY EVENING. 30 tablet 5  . verapamil (CALAN) 120 MG tablet Take 1 tablet (120 mg total) by mouth 3 (three) times daily. 90 tablet 3  . carvedilol (COREG) 25 MG tablet Take 1 tablet (25 mg total) by mouth 2 (two) times daily. 60 tablet 3   No current facility-administered medications for this visit.    Allergies:   Review of patient's allergies indicates no known allergies.    Social History:  The patient  reports  that she has been smoking Cigarettes.  She has been smoking about 0.50 packs per day. She has never used smokeless tobacco. She reports that she does not drink alcohol or use illicit drugs.   Family History:  The patient's family history includes Alcohol abuse in her father; Anxiety disorder in her sister; Diabetes in her father and mother; Drug abuse in her father; Heart disease in her father; Stroke in her sister. She was adopted.    ROS:  Please see the history of present illness.   Otherwise, review of systems are positive for  none.   All other systems are reviewed and negative.    PHYSICAL EXAM: VS:  BP 170/108 mmHg  Pulse 87  Ht 5\' 7"  (1.702 m)  Wt 214 lb 4 oz (97.183 kg)  BMI 33.55 kg/m2 , BMI Body mass index is 33.55 kg/(m^2). GEN: Well nourished, well developed, in no acute distress HEENT: normal Neck: no JVD, carotid bruits, or masses Cardiac: RRR; no  rubs, or gallops,no edema . There is 2 out of 6 systolic flow murmur in the pulmonic area Respiratory:  clear to auscultation bilaterally, normal work of breathing GI: soft, nontender, nondistended, + BS MS: no deformity or atrophy Skin: warm and dry, no rash Neuro:  Strength and sensation are intact Psych: euthymic mood, full affect   EKG:  EKG is ordered today. The ekg ordered today demonstrates   Normal sinus rhythm with lateral T wave changes suggestive of ischemia and prolonged QT interval.   Recent Labs: 11/30/2015: Hemoglobin 14.8; Platelets 319 12/08/2015: ALT 35; BUN 9; Creatinine, Ser 0.63; Potassium 4.1; Sodium 134*; TSH 2.25    Lipid Panel    Component Value Date/Time   CHOL 252* 04/22/2012 0809   TRIG 395.0* 04/22/2012 0809   HDL 27.80* 04/22/2012 0809   CHOLHDL 9 04/22/2012 0809   VLDL 79.0* 04/22/2012 0809   LDLDIRECT 177.1 04/22/2012 0809      Wt Readings from Last 3 Encounters:  02/24/16 214 lb 4 oz (97.183 kg)  02/08/16 215 lb 9.6 oz (97.796 kg)  01/10/16 219 lb (99.338 kg)         ASSESSMENT AND PLAN:  1.   Refractory hypertension:  The patient continues to have uncontrolled hypertension in spite of  5 different blood pressure medications. This is very unusual especially at her age. She claims that she does not miss any doses. Recent workup for pheochromocytoma was negative. I think we do need to evaluate her again for renal artery stenosis and we should check for hyperaldosteronism. In order to do that, I have to take her off spironolactone at least temporarily.  I'm going to hold spironolactone for now. Check  aldosterone to renin ratio next week. I requested renal artery duplex.  I am going to consider switching hydrochlorothiazide to chlorthalidone , and possibly switching verapamil to amlodipine. In the meanwhile, I increased the dose of carvedilol to 25 mg twice daily.  If blood pressure remains uncontrolled with no explanation, I might refer her for enrollment in renal denervation clinical trial.   2. Dyspnea and chest pain:  The patient was supposed to get a stress test last year but she did not follow-up. Given that her blood pressure is still not controlled I'm going to hold off for now. In the meanwhile, I requested an echocardiogram.      Disposition:   FU with me in 1 month  Signed, [name], MD  02/24/2016 1:35 PM    [facility]

## 2016-03-02 ENCOUNTER — Encounter (HOSPITAL_COMMUNITY): Payer: Self-pay | Admitting: Psychiatry

## 2016-03-02 ENCOUNTER — Ambulatory Visit (INDEPENDENT_AMBULATORY_CARE_PROVIDER_SITE_OTHER): Payer: BLUE CROSS/BLUE SHIELD | Admitting: Psychiatry

## 2016-03-02 ENCOUNTER — Other Ambulatory Visit (INDEPENDENT_AMBULATORY_CARE_PROVIDER_SITE_OTHER): Payer: Self-pay

## 2016-03-02 DIAGNOSIS — I159 Secondary hypertension, unspecified: Secondary | ICD-10-CM

## 2016-03-02 DIAGNOSIS — F331 Major depressive disorder, recurrent, moderate: Secondary | ICD-10-CM | POA: Diagnosis not present

## 2016-03-02 NOTE — Patient Instructions (Signed)
Discussed orally 

## 2016-03-02 NOTE — Progress Notes (Signed)
    THERAPIST PROGRESS NOTE  Session Time:   Friday 03/02/2016 1:06 PM  2:09 PM  Participation Level: Active  Behavioral Response: CasualAlertAngry, Anxious and Depressed  Type of Therapy: Individual Therapy  Treatment Goals addressed:   1. Verbalize an understanding of the rationale for treatment of depression.       2. Learn and implement behavioral strategies to overcome depression.       3. Learn and implement calming skills to reduce/manage overall anxiety.  Interventions: Supportive  Summary: Erika [name] is a 45 y.o. female who presents with a long standing history of symptoms of anxiety and depression. She reports increased emotionality and anger outbursts since Thanksgiving. Cuuent stress includes father who has dementia being in the hospital. His health is declining rapidly. She is having conflict with sister about future plans for father's continued care. She reports financial stress as husband is the only one who works. They are behind on bills. Patient also reports stress related to multiple health issues.  Symptoms include anxiety, depressed mood, worry, panic attacks, explosive anger outbursts, and insomnia.  Patient reports no change in symptoms since last session .increased stress since last session. Her father died on 02/26/16.  She continues to experience anxiety, depressed mood, worry, panic attacks, and insomnia. She reports little nvolvement in activities and poor motivation. She continues to experience grief and loss issues regarding the death of her father. She continues to have strong support from her husband, her daughter, and her biological mother.   Suicidal/Homicidal: No  Therapist Response: Therapist works with patient to review symptoms and administer depression screening along with GAD-7, facilitate expression of feelings related to loss, issues normalize feelings of sadness related to loss, develop treatment plan, identify support system, identify  strengths ways, discuss anxiety and rationale for and practice controlled breathing  Plan: Return again in 1-2 weeks. Patient agrees to practice controlled breathing 5-10 minutes 2 times per day, complete controlled breathing log, and bring to next session.  Diagnosis: Axis I: MDD    Axis II: No diagnosis    BYNUM,PEGGY, LCSW 03/02/2016

## 2016-03-05 ENCOUNTER — Telehealth: Payer: Self-pay | Admitting: Family Medicine

## 2016-03-05 ENCOUNTER — Emergency Department (HOSPITAL_COMMUNITY)
Admission: EM | Admit: 2016-03-05 | Discharge: 2016-03-05 | Disposition: A | Discharge status: Home / Self Care | Payer: BLUE CROSS/BLUE SHIELD | Attending: Emergency Medicine | Admitting: Emergency Medicine

## 2016-03-05 ENCOUNTER — Encounter (HOSPITAL_COMMUNITY): Payer: Self-pay | Admitting: Emergency Medicine

## 2016-03-05 DIAGNOSIS — Z5321 Procedure and treatment not carried out due to patient leaving prior to being seen by health care provider: Secondary | ICD-10-CM | POA: Insufficient documentation

## 2016-03-05 DIAGNOSIS — I10 Essential (primary) hypertension: Secondary | ICD-10-CM | POA: Diagnosis not present

## 2016-03-05 DIAGNOSIS — F17210 Nicotine dependence, cigarettes, uncomplicated: Secondary | ICD-10-CM | POA: Diagnosis not present

## 2016-03-05 DIAGNOSIS — E785 Hyperlipidemia, unspecified: Secondary | ICD-10-CM | POA: Diagnosis not present

## 2016-03-05 DIAGNOSIS — R5383 Other fatigue: Secondary | ICD-10-CM | POA: Diagnosis present

## 2016-03-05 DIAGNOSIS — E119 Type 2 diabetes mellitus without complications: Secondary | ICD-10-CM | POA: Diagnosis not present

## 2016-03-05 LAB — URINALYSIS, ROUTINE W REFLEX MICROSCOPIC
BILIRUBIN URINE: NEGATIVE
Glucose, UA: 250 mg/dL — AB
HGB URINE DIPSTICK: NEGATIVE
KETONES UR: NEGATIVE mg/dL
Leukocytes, UA: NEGATIVE
Nitrite: NEGATIVE
PROTEIN: 100 mg/dL — AB
pH: 5.5 (ref 5.0–8.0)

## 2016-03-05 LAB — CBC WITH DIFFERENTIAL/PLATELET
BASOS PCT: 0 %
Basophils Absolute: 0 10*3/uL (ref 0.0–0.1)
EOS ABS: 0.3 10*3/uL (ref 0.0–0.7)
EOS PCT: 2 %
HCT: 45.4 % (ref 36.0–46.0)
Hemoglobin: 15.4 g/dL — ABNORMAL HIGH (ref 12.0–15.0)
LYMPHS ABS: 4.7 10*3/uL — AB (ref 0.7–4.0)
Lymphocytes Relative: 27 %
MCH: 29.8 pg (ref 26.0–34.0)
MCHC: 33.9 g/dL (ref 30.0–36.0)
MCV: 88 fL (ref 78.0–100.0)
MONO ABS: 0.5 10*3/uL (ref 0.1–1.0)
MONOS PCT: 3 %
Neutro Abs: 11.5 10*3/uL — ABNORMAL HIGH (ref 1.7–7.7)
Neutrophils Relative %: 68 %
PLATELETS: 405 10*3/uL — AB (ref 150–400)
RBC: 5.16 MIL/uL — ABNORMAL HIGH (ref 3.87–5.11)
RDW: 12.8 % (ref 11.5–15.5)
WBC: 17.1 10*3/uL — ABNORMAL HIGH (ref 4.0–10.5)

## 2016-03-05 LAB — BASIC METABOLIC PANEL
Anion gap: 10 (ref 5–15)
BUN: 14 mg/dL (ref 6–20)
CALCIUM: 10 mg/dL (ref 8.9–10.3)
CHLORIDE: 99 mmol/L — AB (ref 101–111)
CO2: 25 mmol/L (ref 22–32)
CREATININE: 0.85 mg/dL (ref 0.44–1.00)
GFR calc Af Amer: >60 mL/min (ref >60)
GFR calc non Af Amer: >60 mL/min (ref >60)
Glucose, Bld: 270 mg/dL — ABNORMAL HIGH (ref 65–99)
Potassium: 3.7 mmol/L (ref 3.5–5.1)
Sodium: 134 mmol/L — ABNORMAL LOW (ref 135–145)

## 2016-03-05 LAB — URINE MICROSCOPIC-ADD ON: RBC / HPF: NONE SEEN RBC/hpf (ref 0–5)

## 2016-03-05 LAB — TROPONIN I

## 2016-03-05 NOTE — Telephone Encounter (Signed)
Please call to check on pt and make sure she went to ED.

## 2016-03-05 NOTE — ED Notes (Signed)
Called to room x 3 no answer in waiting room.

## 2016-03-05 NOTE — ED Notes (Signed)
Pt c/o generalized weakness/fatigue x 2 days. Pt reports that her bp is lower than her normal.

## 2016-03-05 NOTE — ED Notes (Signed)
Called to room x 2. No answer in waiting room.

## 2016-03-05 NOTE — Telephone Encounter (Signed)
Patient Name: Erika [name]  DOB: November 26, 1971    Initial Comment Caller states, normally she has extremely high blood pressure 190/110, but right now it is 111/68. Feeling dizzy, weak and tired. Doesn't feel right.    Nurse Assessment  Nurse: Raphael Gibney, RN, Vanita Ingles Date/Time (Eastern Time): 03/05/2016 1:26:05 PM  Confirm and document reason for call. If symptomatic, describe symptoms. You must click the next button to save text entered. ---Caller states she has extremely high BP 190/110. Today it is 111/68. Pulse is 67. She is dizzy and she is feeling weak. Has a dull ache in her left arm and left side of her neck. her right eye has blurry vision. No chest pain or SOB.  Has the patient traveled out of the country within the last 30 days? ---No  Does the patient have any new or worsening symptoms? ---Yes  Will a triage be completed? ---Yes  Related visit to physician within the last 2 weeks? ---No  Does the PT have any chronic conditions? (i.e. diabetes, asthma, etc.) ---Yes  List chronic conditions. ---HTN, diabetes, T wave is abnormal  Is the patient pregnant or possibly pregnant? (Ask all females between the ages of 94-55) ---No  Is this a behavioral health or substance abuse call? ---No     Guidelines    Guideline Title Affirmed Question Affirmed Notes  Dizziness - Lightheadedness [1] MODERATE dizziness (e.g., interferes with normal activities) AND [2] has NOT been evaluated by physician for this (Exception: dizziness caused by heat exposure, sudden standing, or poor fluid intake)   Arm Pain [1] Age > 40 AND [2] no obvious cause AND [3] pain even when not moving the arm (Exception: pain is clearly made worse by moving arm or bending neck)   Arm Pain [1] Age > 40 AND [2] no obvious cause AND [3] pain even when not moving the arm (Exception: pain is clearly made worse by moving arm or bending neck)    Final Disposition User   Go to ED Now Raphael Gibney, RN, Vera    Comments  appt scheduled for  03/06/16 at 10:45 am with Dr. Arnette Norris. Pt is wanting to know about taking the rest of her BP medication today. Please call pt back and let her know about her BP medication.  Pt states Dr. Deborra Medina can also email her about her BP meds on My chart  Called pt back and changed triage outcome to go to ER now as she is having left arm pain. States she will call ambulance. Cancelled appt for 03/06/16 with Dr. Deborra Medina at 10:45 am.   Referrals  REFERRED TO PCP OFFICE  GO TO FACILITY OTHER - SPECIFY  GO TO FACILITY OTHER - SPECIFY   Disagree/Comply: Comply    Disagree/Comply: Comply    Disagree/Comply: Comply

## 2016-03-05 NOTE — Telephone Encounter (Signed)
Pt is being seen at ED. See chart review

## 2016-03-05 NOTE — ED Notes (Signed)
Called for pt, pt not in waiting room.

## 2016-03-06 ENCOUNTER — Ambulatory Visit: Payer: Self-pay | Admitting: Family Medicine

## 2016-03-09 ENCOUNTER — Encounter: Payer: Self-pay | Admitting: Family Medicine

## 2016-03-12 ENCOUNTER — Ambulatory Visit: Payer: BLUE CROSS/BLUE SHIELD

## 2016-03-12 ENCOUNTER — Telehealth: Payer: Self-pay | Admitting: Cardiovascular Disease

## 2016-03-12 ENCOUNTER — Ambulatory Visit (INDEPENDENT_AMBULATORY_CARE_PROVIDER_SITE_OTHER): Payer: BLUE CROSS/BLUE SHIELD

## 2016-03-12 ENCOUNTER — Other Ambulatory Visit: Payer: Self-pay

## 2016-03-12 DIAGNOSIS — R0602 Shortness of breath: Secondary | ICD-10-CM | POA: Diagnosis not present

## 2016-03-12 DIAGNOSIS — I10 Essential (primary) hypertension: Secondary | ICD-10-CM | POA: Diagnosis not present

## 2016-03-12 DIAGNOSIS — I159 Secondary hypertension, unspecified: Secondary | ICD-10-CM

## 2016-03-12 LAB — ALDOSTERONE + RENIN ACTIVITY W/ RATIO
ALDOS/RENIN RATIO: 1.6 (ref 0.0–30.0)
ALDOSTERONE: 11.2 ng/dL (ref 0.0–30.0)
RENIN: 7.13 ng/mL/h — AB (ref 0.167–5.380)

## 2016-03-12 NOTE — Telephone Encounter (Signed)
Patient went to [facility] ED on 03-05-16 for low bp (111/67) had labs drawn. Left before seen by physician.  Patient says the ekg was abnormal and see lab results.  Please call to discuss.

## 2016-03-13 NOTE — Telephone Encounter (Signed)
Pt states she was told at [place] that her EKG showed changes. She asks if Dr. Fletcher Anon can look at this and advise. Pt had EKG at 3/3 OV for comparison. Forward to MD

## 2016-03-13 NOTE — Telephone Encounter (Signed)
S/w pt who reports she went to [facility] ED March 13 w/arm pain and BP 111/67. She went via EMS and reports during the 2 hours she was there, she had an EKG and blood work. States she got tired of waiting and left. She has torn rotator cuff which she feels caused the arm pain. Pt has hx of HTN. BP meds adjusted at March 3 OV. Pt reports taking as prescribed w/no missed doses. She had renal US and echo yesterday. Awaiting results. Advised pt to continue to monitor BP, take meds as prescribed. Reviewed s/s that would need immediate attention in the ER. Pt verbalized understanding and is agreeable w/plan.

## 2016-03-14 NOTE — Telephone Encounter (Signed)
Minor ECG changes likely related to lead misplacement.

## 2016-03-15 ENCOUNTER — Ambulatory Visit (INDEPENDENT_AMBULATORY_CARE_PROVIDER_SITE_OTHER): Payer: BLUE CROSS/BLUE SHIELD | Admitting: Psychiatry

## 2016-03-15 ENCOUNTER — Encounter (HOSPITAL_COMMUNITY): Payer: Self-pay | Admitting: Psychiatry

## 2016-03-15 ENCOUNTER — Encounter: Payer: Self-pay | Admitting: Family Medicine

## 2016-03-15 ENCOUNTER — Ambulatory Visit (INDEPENDENT_AMBULATORY_CARE_PROVIDER_SITE_OTHER): Payer: BLUE CROSS/BLUE SHIELD | Admitting: Family Medicine

## 2016-03-15 VITALS — BP 162/102 | HR 86 | Temp 98.5°F | Wt 219.5 lb

## 2016-03-15 DIAGNOSIS — F331 Major depressive disorder, recurrent, moderate: Secondary | ICD-10-CM | POA: Diagnosis not present

## 2016-03-15 DIAGNOSIS — R809 Proteinuria, unspecified: Secondary | ICD-10-CM | POA: Insufficient documentation

## 2016-03-15 DIAGNOSIS — M542 Cervicalgia: Secondary | ICD-10-CM | POA: Diagnosis not present

## 2016-03-15 DIAGNOSIS — I701 Atherosclerosis of renal artery: Secondary | ICD-10-CM | POA: Diagnosis not present

## 2016-03-15 DIAGNOSIS — G44029 Chronic cluster headache, not intractable: Secondary | ICD-10-CM

## 2016-03-15 DIAGNOSIS — I10 Essential (primary) hypertension: Secondary | ICD-10-CM | POA: Diagnosis not present

## 2016-03-15 MED ORDER — HYDROCODONE-ACETAMINOPHEN 5-325 MG PO TABS: 1.0000 | ORAL_TABLET | Freq: Four times a day (QID) | ORAL | Status: DC | PRN

## 2016-03-15 NOTE — Progress Notes (Signed)
Subjective:   Patient ID: Erika [name], female    DOB: 1971/08/10, 45 y.o.   MRN: [id number]  Erika [name] is a pleasant 45 y.o. year old female who presents to clinic today with Hospitalization Follow-up  on 03/15/2016  HPI: [facility]estic to [facility] ER but left before she was seen for left arm pain.  Was actually normotensive in ER and EKG reassuring so she left.  BP has been difficult to control but improving- seeing Dr. Fletcher Anon.  Elevated today but has not taken her blood pressure medication today. Last saw Dr. Fletcher Anon on 02/24/16. Had Renal artery [name] on 03/12/16- consistent with RAS. Echo reassuring. She has appt to see him to discuss this on 03/27/16.  Her headaches have returned.  Nothing seems to be making them better.  UA 3 months ago- trace protein, 10 days ago in ER, protein increased back on to 100 mg/dL.  Has had proteinuria in past.  Current Outpatient Prescriptions on File Prior to Visit  Medication Sig Dispense Refill  . carvedilol (COREG) 25 MG tablet Take 1 tablet (25 mg total) by mouth 2 (two) times daily. 60 tablet 3  . clonazePAM (KLONOPIN) 0.5 MG tablet Take 1 tablet (0.5 mg total) by mouth 3 (three) times daily. 90 tablet 0  . enalapril (VASOTEC) 20 MG tablet Take 1 tablet (20 mg total) by mouth daily. 30 tablet 5  . FLUoxetine (PROZAC) 20 MG capsule Take 1 capsule (20 mg total) by mouth daily. 30 capsule 0  . hydrochlorothiazide (HYDRODIURIL) 25 MG tablet TAKE 1 pill daily 30 tablet 2  . lamoTRIgine (LAMICTAL) 100 MG tablet Take 1/2 tab daily for 1 week aned than 1 tab daily (Patient taking differently: Take 100 mg by mouth daily. ) 30 tablet 0  . metFORMIN (GLUCOPHAGE) 500 MG tablet Take 1 tablet (500 mg total) by mouth daily with breakfast. (Patient taking differently: Take 500 mg by mouth 2 (two) times daily with a meal. ) 30 tablet 3  . simvastatin (ZOCOR) 20 MG tablet TAKE 1 TABLET (20 MG TOTAL) BY MOUTH EVERY EVENING. 30 tablet 5  . verapamil (CALAN) 120 MG  tablet Take 1 tablet (120 mg total) by mouth 3 (three) times daily. 90 tablet 3   No current facility-administered medications on file prior to visit.    No Known Allergies  Past Medical History  Diagnosis Date  . Essential hypertension, benign   . Other and unspecified hyperlipidemia   . Unspecified essential hypertension   . Arthralgia of temporomandibular joint   . PTSD (post-traumatic stress disorder)   . Depression   . Fatty liver disease, nonalcoholic Q000111Q  . Type 2 diabetes mellitus [facility]) January 2017    Past Surgical History  Procedure Laterality Date  . Cholecystectomy    . Tonsillectomy    . Cesarean section      Family History  Problem Relation Age of Onset  . Adopted: Yes  . Diabetes Mother   . Diabetes Father   . Alcohol abuse Father   . Heart disease Father   . Drug abuse Father   . Stroke Sister   . Anxiety disorder Sister     Social History   Social History  . Marital Status: Married    Spouse Name: [name]  . Number of Children: [name]  . Years of Education: [name]   Occupational History  . Not on file.   Social History Main Topics  . Smoking status: Current Every Day Smoker -- 0.50 packs/day  Types: Cigarettes  . Smokeless tobacco: Never Used  . Alcohol Use: No  . Drug Use: No  . Sexual Activity: Yes    Birth Control/ Protection: None   Other Topics Concern  . Not on file   Social History Narrative   The PMH, PSH, Social History, Family History, Medications, and allergies have been reviewed in [facility], and have been updated if relevant.   Review of Systems  Constitutional: Positive for fatigue.  Eyes: Negative.   Respiratory: Negative.   Cardiovascular: Negative.   Gastrointestinal: Negative.   Endocrine: Negative.   Genitourinary: Negative.   Musculoskeletal: Positive for myalgias.  Allergic/Immunologic: Negative.   Neurological: Positive for headaches. Negative for light-headedness.  Hematological: Negative.     Psychiatric/Behavioral: Negative.   All other systems reviewed and are negative.      Objective:    BP 162/102 mmHg  Pulse 86  Temp(Src) 98.5 F (36.9 C) (Oral)  Wt 219 lb 8 oz (99.565 kg)  SpO2 96%  LMP 02/22/2016   Physical Exam  Constitutional: She is oriented to person, place, and time. She appears well-developed and well-nourished. No distress.  HENT:  Head: Normocephalic.  Eyes: Conjunctivae are normal.  Cardiovascular: Normal rate.   Pulmonary/Chest: Effort normal.  Musculoskeletal: Normal range of motion.  Neurological: She is alert and oriented to person, place, and time. No cranial nerve deficit.  Skin: Skin is warm and dry. She is not diaphoretic.  Psychiatric: She has a normal mood and affect. Her behavior is normal. Judgment and thought content normal.  Nursing note and vitals reviewed.         Assessment & Plan:   HTN (hypertension), malignant  Proteinuria - Plan: Protein, urine, 24 hour  Renal artery stenosis (HCC)  Neck pain on left side No Follow-up on file.

## 2016-03-15 NOTE — Progress Notes (Signed)
     THERAPIST PROGRESS NOTE  Session Time:    Thursday 03/15/2016 1:10 PM -2:05 PM  Participation Level: Active  Behavioral Response: CasualAlertAngry, Anxious and Depressed  Type of Therapy: Individual Therapy  Treatment Goals addressed:   1. Verbalize an understanding of the rationale for treatment of depression.       2. Learn and implement behavioral strategies to overcome depression.       3. Learn and implement calming skills to reduce/manage overall anxiety.  Interventions: Supportive  Summary: Erika [name] is a 45 y.o. female who presents with a long standing history of symptoms of anxiety and depression. She reports increased emotionality and anger outbursts since Thanksgiving. Cuuent stress includes father who has dementia being in the hospital. His health is declining rapidly. She is having conflict with sister about future plans for father's continued care. She reports financial stress as husband is the only one who works. They are behind on bills. Patient also reports stress related to multiple health issues.  Symptoms include anxiety, depressed mood, worry, panic attacks, explosive anger outbursts, and insomnia.  Patient reports  increased stress since last session. She recently found out there is a blockage in her renal artery. She continues to experience depressed mood, anxiety, and panic attacks. However, she has been practicing controlled breathing and says this has helped especially with relaxing enough to be able to go to sleep. She also reports she did go to [place] once without her husband. She stays at home most of the time except for attending medical appointments. She socializes with immediate family but reports little to no contact with any other people. She engages in some activities at home but reports she does not plan activities as she tends to be very critical of self if she does not follow through with her plans. She admits pattern of perfectionistic  thinking as well as defining self based on her performance and contributions. She continues to experience loss issues regarding having to quit work about a year and a half ago.   Suicidal/Homicidal: No  Therapist Response: Therapist works with patient to review symptoms, praise and reinforce patient's use of controlled breathing, facilitate expression of feelings related to loss, provide psychoeducation regarding depression and anxiety, discuss rationale for planning and behavioral activation, assist patient in identifying ways to improve self-care/structure/daily routine with the use of daily planning,  Plan: Return again in 1-2 weeks. Patient agrees to practice controlled breathing 5-10 minutes 2 times per day, complete controlled breathing log, use daily planning, and bring to next session.     Diagnosis: Axis I: MDD    Axis II: No diagnosis    Jerian Morais, LCSW 03/15/2016

## 2016-03-15 NOTE — Assessment & Plan Note (Signed)
Deteriorated and difficult to control given her other health issues current. Short term use of narcotics appropriate but I did warn about possibility of medication overuse and rebound headaches. Rx printed and given to pt for 30 tablets of norco. The patient indicates understanding of these issues and agrees with the plan.

## 2016-03-15 NOTE — Progress Notes (Signed)
Pre visit review using our clinic review tool, if applicable. No additional management support is needed unless otherwise documented below in the visit note. 

## 2016-03-15 NOTE — Patient Instructions (Signed)
Discussed orally 

## 2016-03-15 NOTE — Assessment & Plan Note (Signed)
New- diagnosis. BP has improved. Will await further cardiology recommendations.

## 2016-03-15 NOTE — Assessment & Plan Note (Signed)
Deteriorated. Repeat 24 hour urine.

## 2016-03-19 ENCOUNTER — Ambulatory Visit (INDEPENDENT_AMBULATORY_CARE_PROVIDER_SITE_OTHER): Payer: BLUE CROSS/BLUE SHIELD | Admitting: Psychiatry

## 2016-03-19 ENCOUNTER — Encounter (HOSPITAL_COMMUNITY): Payer: Self-pay | Admitting: Psychiatry

## 2016-03-19 ENCOUNTER — Other Ambulatory Visit (INDEPENDENT_AMBULATORY_CARE_PROVIDER_SITE_OTHER): Payer: BLUE CROSS/BLUE SHIELD

## 2016-03-19 VITALS — BP 178/110 | HR 82 | Ht 67.0 in | Wt 215.6 lb

## 2016-03-19 DIAGNOSIS — R809 Proteinuria, unspecified: Secondary | ICD-10-CM | POA: Diagnosis not present

## 2016-03-19 DIAGNOSIS — F331 Major depressive disorder, recurrent, moderate: Secondary | ICD-10-CM

## 2016-03-19 MED ORDER — LAMOTRIGINE 150 MG PO TABS: 150.0000 mg | ORAL_TABLET | Freq: Every day | ORAL | Status: DC

## 2016-03-19 MED ORDER — CLONAZEPAM 0.5 MG PO TABS: 0.5000 mg | ORAL_TABLET | Freq: Three times a day (TID) | ORAL | Status: DC

## 2016-03-19 MED ORDER — FLUOXETINE HCL 20 MG PO CAPS: 20.0000 mg | ORAL_CAPSULE | Freq: Every day | ORAL | Status: DC
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
## 2016-03-20 ENCOUNTER — Encounter: Payer: Self-pay | Admitting: Family Medicine

## 2016-03-20 ENCOUNTER — Other Ambulatory Visit: Payer: Self-pay | Admitting: Family Medicine

## 2016-03-20 DIAGNOSIS — R809 Proteinuria, unspecified: Secondary | ICD-10-CM

## 2016-03-20 LAB — PROTEIN, URINE, 24 HOUR
PROTEIN 24H UR: 840 mg/(24.h) — AB (ref <150)
PROTEIN, URINE: 42 mg/dL — AB (ref 5–24)

## 2016-03-23 ENCOUNTER — Ambulatory Visit (HOSPITAL_COMMUNITY): Payer: Self-pay | Admitting: Psychiatry

## 2016-04-05 ENCOUNTER — Ambulatory Visit (INDEPENDENT_AMBULATORY_CARE_PROVIDER_SITE_OTHER): Payer: BLUE CROSS/BLUE SHIELD | Admitting: Psychiatry

## 2016-04-05 ENCOUNTER — Encounter (HOSPITAL_COMMUNITY): Payer: Self-pay | Admitting: Psychiatry

## 2016-04-05 DIAGNOSIS — F331 Major depressive disorder, recurrent, moderate: Secondary | ICD-10-CM | POA: Diagnosis not present

## 2016-04-05 NOTE — Progress Notes (Signed)
     THERAPIST PROGRESS NOTE  Session Time:    Thursday 04/05/2016  1:10 PM  - 2:05 PM              Participation Level: Active  Behavioral Response: CasualAlertAngry, Anxious and Depressed  Type of Therapy: Individual Therapy  Treatment Goals addressed:   1. Verbalize an understanding of the rationale for treatment of depression.       2. Learn and implement behavioral strategies to overcome depression.       3. Learn and implement calming skills to reduce/manage overall anxiety.  Interventions: Supportive  Summary: Erika [name] is a 45 y.o. female who presents with a long standing history of symptoms of anxiety and depression. She reports increased emotionality and anger outbursts since Thanksgiving. Cuuent stress includes father who has dementia being in the hospital. His health is declining rapidly. She is having conflict with sister about future plans for father's continued care. She reports financial stress as husband is the only one who works. They are behind on bills. Patient also reports stress related to multiple health issues.  Symptoms include anxiety, depressed mood, worry, panic attacks, explosive anger outbursts, and insomnia.  Patient reports continued stress, anxiety, and depression since last session. She continues to have health issues and is scheduled to see her cardiologist tomorrow morning for consultation regarding surgery to address a 60% blockage recently in her renal artery. Patient reports she has increased her involvement in activity including going outside with her dog and being more involved with her daughter. She also has been going to do things outside of her home once a week by herself. She has been practicing controlled breathing and reports decreased intensity, duration, and frequency of panic attacks. She continues to express frustration regarding the relationship with her sister. Patient shares more information today regarding trauma history.    Suicidal/Homicidal: No  Therapist Response:  Reviewed symptoms, facilitated expression of feelings, praised and reinforced use of controlled breathing, praised and reinforced increased involvement in activity, discussed trauma history and effects on patient's current functioning, discussed poem "Allegory of Change" to assist patient identify healthy ways to think about change process  Plan: Return again in 1-2 weeks. Patient agrees to practice controlled breathing 5-10 minutes 2 times per day, continue involvement in activity     Diagnosis: Axis I: MDD    Axis II: No diagnosis    Fredrich Cory, LCSW 04/05/2016

## 2016-04-05 NOTE — Patient Instructions (Signed)
Discussed orally 

## 2016-04-06 ENCOUNTER — Ambulatory Visit (INDEPENDENT_AMBULATORY_CARE_PROVIDER_SITE_OTHER): Payer: BLUE CROSS/BLUE SHIELD | Admitting: Cardiovascular Disease

## 2016-04-06 ENCOUNTER — Encounter: Payer: Self-pay | Admitting: Cardiovascular Disease

## 2016-04-06 VITALS — BP 188/110 | HR 91 | Ht 62.0 in | Wt 220.5 lb

## 2016-04-06 DIAGNOSIS — I10 Essential (primary) hypertension: Secondary | ICD-10-CM | POA: Diagnosis not present

## 2016-04-06 DIAGNOSIS — R0602 Shortness of breath: Secondary | ICD-10-CM | POA: Diagnosis not present

## 2016-04-06 MED ORDER — ASPIRIN EC 81 MG PO TBEC: 81.0000 mg | DELAYED_RELEASE_TABLET | Freq: Every day | ORAL | Status: DC

## 2016-04-06 NOTE — Patient Instructions (Addendum)
Medication Instructions:  Your physician has recommended you make the following change in your medication:  START taking 81mg  aspirin once a day   Labwork: none  Testing/Procedure: Your physician has requested that you have a lexiscan myoview. For further information please visit [email or web address]. Please follow instruction sheet, as given.  [place]  Your caregiver has ordered a Stress Test with nuclear imaging. The purpose of this test is to evaluate the blood supply to your heart muscle. This procedure is referred to as a "Non-Invasive Stress Test." This is because other than having an IV started in your vein, nothing is inserted or "invades" your body. Cardiac stress tests are done to find areas of poor blood flow to the heart by determining the extent of coronary artery disease (CAD). Some patients exercise on a treadmill, which naturally increases the blood flow to your heart, while others who are  unable to walk on a treadmill due to physical limitations have a pharmacologic/chemical stress agent called Lexiscan . This medicine will mimic walking on a treadmill by temporarily increasing your coronary blood flow.   Please note: these test may take anywhere between 2-4 hours to complete  PLEASE REPORT TO [facility] AT THE FIRST DESK WILL DIRECT YOU WHERE TO GO  Date of Procedure: Friday, April 21  Arrival Time for Procedure: 7:45pm  Instructions regarding medication:   __xx__ : Hold diabetes medication morning of procedure  _xx___:  Hold coreg night before procedure and morning of procedure   PLEASE NOTIFY THE OFFICE AT LEAST 24 HOURS IN ADVANCE IF YOU ARE UNABLE TO KEEP YOUR APPOINTMENT.  [phone] AND  PLEASE NOTIFY NUCLEAR MEDICINE AT [facility] AT LEAST 24 HOURS IN ADVANCE IF YOU ARE UNABLE TO KEEP YOUR APPOINTMENT. [phone]  How to prepare for your Myoview test:   Do not eat or drink after midnight  No caffeine for 24 hours  prior to test  No smoking 24 hours prior to test.  Your medication may be taken with water.  If your doctor stopped a medication because of this test, do not take that medication.  Ladies, please do not wear dresses.  Skirts or pants are appropriate. Please wear a short sleeve shirt.  No perfume, cologne or lotion.  Wear comfortable walking shoes. No heels!       Renal artery angiogram Wednesday, April 26 Arrival Time: 8:30am [facility] Short Stay, Entrance A [place] [phone]  Nothing to eat or drink after midnight the evening before your exam. You may take you morning medications with a sip of water. DO NOT TAKE METFORMIN 24 HOURS BEFORE AND FOR 48 HOURS AFTER YOUR PROCEDURE.       Follow-Up: Your physician recommends that you schedule a follow-up appointment in one month with Dr. Fletcher Anon.    Any Other Special Instructions Will Be Listed Below (If Applicable).     If you need a refill on your cardiac medications before your next appointment, please call your pharmacy.  Cardiac Nuclear Scanning A cardiac nuclear scan is used to check your heart for problems, such as the following:  A portion of the heart is not getting enough blood.  Part of the heart muscle has died, which happens with a heart attack.  The heart wall is not working normally.  In this test, a radioactive dye (tracer) is injected into your bloodstream. After the tracer has traveled to your heart, a scanning device is used to measure how  much of the tracer is absorbed by or distributed to various areas of your heart. LET [facility] CARE PROVIDER KNOW ABOUT:  Any allergies you have.  All medicines you are taking, including vitamins, herbs, eye drops, creams, and over-the-counter medicines.  Previous problems you or members of your family have had with the use of anesthetics.  Any blood disorders you have.  Previous surgeries you have had.  Medical  conditions you have.  RISKS AND COMPLICATIONS Generally, this is a safe procedure. However, as with any procedure, problems can occur. Possible problems include:   Serious chest pain.  Rapid heartbeat.  Sensation of warmth in your chest. This usually passes quickly. BEFORE THE PROCEDURE Ask your health care provider about changing or stopping your regular medicines. PROCEDURE This procedure is usually done at a hospital and takes 2-4 hours.  An IV tube is inserted into one of your veins.  Your health care provider will inject a small amount of radioactive tracer through the tube.  You will then wait for 20-40 minutes while the tracer travels through your bloodstream.  You will lie down on an exam table so images of your heart can be taken. Images will be taken for about 15-20 minutes.  You will exercise on a treadmill or stationary bike. While you exercise, your heart activity will be monitored with an electrocardiogram (ECG), and your blood pressure will be checked.  If you are unable to exercise, you may be given a medicine to make your heart beat faster.  When blood flow to your heart has peaked, tracer will again be injected through the IV tube.  After 20-40 minutes, you will get back on the exam table and have more images taken of your heart.  When the procedure is over, your IV tube will be removed. AFTER THE PROCEDURE  You will likely be able to leave shortly after the test. Unless your health care provider tells you otherwise, you may return to your normal schedule, including diet, activities, and medicines.  Make sure you find out how and when you will get your test results.   This information is not intended to replace advice given to you by your health care provider. Make sure you discuss any questions you have with your health care provider.   Document Released: 01/04/2005 Document Revised: 12/15/2013 Document Reviewed: 11/18/2013 Elsevier Interactive Patient  Education [employer].

## 2016-04-06 NOTE — Progress Notes (Signed)
Cardiology Office Note   Date:  04/06/2016   ID:  Erika [name], DOB 05-Mar-1971, MRN [id number]  PCP:  Arnette Norris, MD Cardiologist:   [name], MD   Chief Complaint  Patient presents with  . other    Discuss results c/o sob with exertion. Meds reviewed verbally with pt.      History of Present Illness: Erika [name] is a 45 y.o. female who presents for a follow-up visit regarding refractory hypertension and chest pain. She She has history of hypertension for 18 years.   She suffers from significant anxiety and depression. Her blood pressure gradually started increasing last year and became uncontrolled again. She had one hospitalization for hypotension and bradycardia with borderline elevated lactic acid. She followed up with Dr. Deborra Medina.  workup for pheochromocytoma was negative.  During last visit, I increased the dose of carvedilol to 25 mg twice daily. I proceeded with renal artery duplex which showed evidence of significant right renal artery stenosis and mild left renal artery stenosis. Renin was mildly elevated with normal aldosterone. The patient was seen recently by Dr. Holley Raring for mild proteinuria. She reports improvement in blood pressure but she did not take her medications today. Blood pressure today is 188/110. She continues to complain of dyspnea with minimal activities without chest pain.    Past Medical History  Diagnosis Date  . Essential hypertension, benign   . Other and unspecified hyperlipidemia   . Unspecified essential hypertension   . Arthralgia of temporomandibular joint   . PTSD (post-traumatic stress disorder)   . Depression   . Fatty liver disease, nonalcoholic [id number]  . Type 2 diabetes mellitus [facility]) January 2017    Past Surgical History  Procedure Laterality Date  . Cholecystectomy    . Tonsillectomy    . Cesarean section       Current Outpatient Prescriptions  Medication Sig Dispense Refill  . aspirin EC 81 MG tablet Take  1 tablet (81 mg total) by mouth daily. 90 tablet 3  . carvedilol (COREG) 25 MG tablet Take 1 tablet (25 mg total) by mouth 2 (two) times daily. 60 tablet 3  . clonazePAM (KLONOPIN) 0.5 MG tablet Take 1 tablet (0.5 mg total) by mouth 3 (three) times daily. 90 tablet 2  . enalapril (VASOTEC) 20 MG tablet Take 1 tablet (20 mg total) by mouth daily. 30 tablet 5  . FLUoxetine (PROZAC) 20 MG capsule Take 1 capsule (20 mg total) by mouth daily. 30 capsule 2  . hydrochlorothiazide (HYDRODIURIL) 25 MG tablet TAKE 1 pill daily 30 tablet 2  . HYDROcodone-acetaminophen (NORCO) 5-325 MG tablet Take 1 tablet by mouth every 6 (six) hours as needed for moderate pain. 30 tablet 0  . lamoTRIgine (LAMICTAL) 150 MG tablet Take 1 tablet (150 mg total) by mouth daily. 30 tablet 2  . metFORMIN (GLUCOPHAGE) 500 MG tablet Take 1 tablet (500 mg total) by mouth daily with breakfast. (Patient taking differently: Take 500 mg by mouth 2 (two) times daily with a meal. ) 30 tablet 3  . simvastatin (ZOCOR) 20 MG tablet TAKE 1 TABLET (20 MG TOTAL) BY MOUTH EVERY EVENING. 30 tablet 5  . verapamil (CALAN) 120 MG tablet Take 1 tablet (120 mg total) by mouth 3 (three) times daily. 90 tablet 3   No current facility-administered medications for this visit.    Allergies:   Review of patient's allergies indicates no known allergies.    Social History:  The patient  reports that she  has been smoking Cigarettes.  She has been smoking about 0.50 packs per day. She has never used smokeless tobacco. She reports that she does not drink alcohol or use illicit drugs.   Family History:  The patient's family history includes Alcohol abuse in her father; Anxiety disorder in her sister; Diabetes in her father and mother; Drug abuse in her father; Heart disease in her father; Stroke in her sister. She was adopted.    ROS:  Please see the history of present illness.   Otherwise, review of systems are positive for none.   All other systems are  reviewed and negative.    PHYSICAL EXAM: VS:  BP 188/110 mmHg  Pulse 91  Ht 5\' 2"  (1.575 m)  Wt 220 lb 8 oz (100.018 kg)  BMI 40.32 kg/m2 , BMI Body mass index is 40.32 kg/(m^2). GEN: Well nourished, well developed, in no acute distress HEENT: normal Neck: no JVD, carotid bruits, or masses Cardiac: RRR; no  rubs, or gallops,no edema . There is 2 out of 6 systolic flow murmur in the pulmonic area Respiratory:  clear to auscultation bilaterally, normal work of breathing GI: soft, nontender, nondistended, + BS MS: no deformity or atrophy Skin: warm and dry, no rash Neuro:  Strength and sensation are intact Psych: euthymic mood, full affect   EKG:  EKG is ordered today. The ekg ordered today demonstrates   Normal sinus rhythm with lateral T wave changes suggestive of ischemia and prolonged QT interval.   Recent Labs: 12/08/2015: ALT 35; TSH 2.25 03/05/2016: BUN 14; Creatinine, Ser 0.85; Hemoglobin 15.4*; Platelets 405*; Potassium 3.7; Sodium 134*    Lipid Panel    Component Value Date/Time   CHOL 252* 04/22/2012 0809   TRIG 395.0* 04/22/2012 0809   HDL 27.80* 04/22/2012 0809   CHOLHDL 9 04/22/2012 0809   VLDL 79.0* 04/22/2012 0809   LDLDIRECT 177.1 04/22/2012 0809      Wt Readings from Last 3 Encounters:  04/06/16 220 lb 8 oz (100.018 kg)  03/19/16 215 lb 9.6 oz (97.796 kg)  03/15/16 219 lb 8 oz (99.565 kg)         ASSESSMENT AND PLAN:  1.   Renovascular hypertension:  The patient continues to have uncontrolled hypertension in spite of  5 different blood pressure medications. This is very unusual especially at her age. Recent workup for pheochromocytoma was negative.  Renal artery duplex was suggestive of significant right renal artery stenosis. Renin was mildly elevated with normal aldosterone. This can be seen in renal artery stenosis. There was also mild proteinuria. Due to all of this, I recommend proceeding with renal artery angiography and possible  endovascular intervention. I discussed risks and benefits with her.    2. Dyspnea and chest pain:   The patient continues to have significant exertional dyspnea and occasional chest pain. Echocardiogram showed normal LV systolic function with no significant valvular abnormalities. I requested a pharmacologic nuclear stress test for evaluation before her renal artery angiography in case cardiac catheterization is needed at the same time. Given that her blood pressure is still not controlled, I do not recommend a treadmill stress test.    Disposition:   FU with me in 1 month  Signed, [name], MD  04/06/2016 6:55 PM    [place]

## 2016-04-09 DIAGNOSIS — I10 Essential (primary) hypertension: Secondary | ICD-10-CM | POA: Diagnosis not present

## 2016-04-09 DIAGNOSIS — E785 Hyperlipidemia, unspecified: Secondary | ICD-10-CM | POA: Diagnosis not present

## 2016-04-09 DIAGNOSIS — I701 Atherosclerosis of renal artery: Secondary | ICD-10-CM | POA: Diagnosis not present

## 2016-04-09 DIAGNOSIS — R809 Proteinuria, unspecified: Secondary | ICD-10-CM | POA: Diagnosis not present

## 2016-04-11 ENCOUNTER — Other Ambulatory Visit: Payer: Self-pay | Admitting: Family Medicine

## 2016-04-12 MED ORDER — HYDROCODONE-ACETAMINOPHEN 5-325 MG PO TABS: 1.0000 | ORAL_TABLET | Freq: Four times a day (QID) | ORAL | Status: DC | PRN

## 2016-04-12 NOTE — Telephone Encounter (Signed)
Pt was given Rx at last hospital f/u.

## 2016-04-13 ENCOUNTER — Ambulatory Visit
Admission: RE | Admit: 2016-04-13 | Discharge: 2016-04-13 | Disposition: A | Discharge status: Home / Self Care | Payer: BLUE CROSS/BLUE SHIELD | Source: Ambulatory Visit | Attending: Cardiovascular Disease | Admitting: Cardiovascular Disease

## 2016-04-13 ENCOUNTER — Encounter: Payer: Self-pay | Admitting: Family Medicine

## 2016-04-13 DIAGNOSIS — R0602 Shortness of breath: Secondary | ICD-10-CM | POA: Insufficient documentation

## 2016-04-13 DIAGNOSIS — Z79891 Long term (current) use of opiate analgesic: Secondary | ICD-10-CM | POA: Diagnosis not present

## 2016-04-13 LAB — NM MYOCAR MULTI W/SPECT W/WALL MOTION / EF
CSEPED: 0 min
CSEPEDS: 0 s
CSEPEW: 1 METS
CSEPPHR: 110 {beats}/min
LV dias vol: 111 mL (ref 46–106)
LVSYSVOL: 23 mL
MPHR: 178 {beats}/min
NUC STRESS TID: 0.92
Percent HR: 61 %
Rest HR: 110 {beats}/min
SDS: 0
SRS: 1
SSS: 0

## 2016-04-13 MED ORDER — TECHNETIUM TC 99M SESTAMIBI - CARDIOLITE
28.6200 | Freq: Once | INTRAVENOUS | Status: AC | PRN
  Administered 2016-04-13: 28.62 via INTRAVENOUS

## 2016-04-13 MED ORDER — REGADENOSON 0.4 MG/5ML IV SOLN
0.4000 mg | Freq: Once | INTRAVENOUS | Status: AC
  Administered 2016-04-13: 0.4 mg via INTRAVENOUS

## 2016-04-13 MED ORDER — TECHNETIUM TC 99M SESTAMIBI - CARDIOLITE
12.9200 | Freq: Once | INTRAVENOUS | Status: AC | PRN
  Administered 2016-04-13: 08:00:00 12.92 via INTRAVENOUS

## 2016-04-13 NOTE — Telephone Encounter (Signed)
Lm on pts vm and informed her Rx is available for pickup from the front desk. Pt advised third party unable to pickup  

## 2016-04-17 ENCOUNTER — Telehealth: Payer: Self-pay | Admitting: Cardiovascular Disease

## 2016-04-17 NOTE — Telephone Encounter (Signed)
S/w pt to review renal artery angiogram instructions. Pt verbalized understanding, is agreeable w/plan and requests all notes and results be forwarded to Dr. Holley Raring.

## 2016-04-18 ENCOUNTER — Ambulatory Visit (HOSPITAL_COMMUNITY): Payer: Self-pay | Admitting: Psychiatry

## 2016-04-18 ENCOUNTER — Ambulatory Visit (HOSPITAL_COMMUNITY)
Admission: RE | Admit: 2016-04-18 | Discharge: 2016-04-18 | Disposition: A | Discharge status: Home / Self Care | Payer: BLUE CROSS/BLUE SHIELD | Source: Ambulatory Visit | Attending: Cardiovascular Disease | Admitting: Cardiovascular Disease

## 2016-04-18 ENCOUNTER — Encounter (HOSPITAL_COMMUNITY)
Admission: RE | Disposition: A | Discharge status: Home / Self Care | Payer: Self-pay | Source: Ambulatory Visit | Attending: Cardiovascular Disease

## 2016-04-18 DIAGNOSIS — Z823 Family history of stroke: Secondary | ICD-10-CM | POA: Insufficient documentation

## 2016-04-18 DIAGNOSIS — F419 Anxiety disorder, unspecified: Secondary | ICD-10-CM | POA: Diagnosis not present

## 2016-04-18 DIAGNOSIS — E785 Hyperlipidemia, unspecified: Secondary | ICD-10-CM | POA: Diagnosis not present

## 2016-04-18 DIAGNOSIS — F4310 Post-traumatic stress disorder, unspecified: Secondary | ICD-10-CM | POA: Insufficient documentation

## 2016-04-18 DIAGNOSIS — R809 Proteinuria, unspecified: Secondary | ICD-10-CM | POA: Insufficient documentation

## 2016-04-18 DIAGNOSIS — Z7982 Long term (current) use of aspirin: Secondary | ICD-10-CM | POA: Insufficient documentation

## 2016-04-18 DIAGNOSIS — I10 Essential (primary) hypertension: Secondary | ICD-10-CM

## 2016-04-18 DIAGNOSIS — E119 Type 2 diabetes mellitus without complications: Secondary | ICD-10-CM | POA: Diagnosis not present

## 2016-04-18 DIAGNOSIS — Z7984 Long term (current) use of oral hypoglycemic drugs: Secondary | ICD-10-CM | POA: Insufficient documentation

## 2016-04-18 DIAGNOSIS — Z8249 Family history of ischemic heart disease and other diseases of the circulatory system: Secondary | ICD-10-CM | POA: Insufficient documentation

## 2016-04-18 DIAGNOSIS — F17210 Nicotine dependence, cigarettes, uncomplicated: Secondary | ICD-10-CM | POA: Insufficient documentation

## 2016-04-18 DIAGNOSIS — I150 Renovascular hypertension: Secondary | ICD-10-CM | POA: Diagnosis not present

## 2016-04-18 DIAGNOSIS — K760 Fatty (change of) liver, not elsewhere classified: Secondary | ICD-10-CM | POA: Insufficient documentation

## 2016-04-18 DIAGNOSIS — F329 Major depressive disorder, single episode, unspecified: Secondary | ICD-10-CM | POA: Insufficient documentation

## 2016-04-18 HISTORY — PX: PERIPHERAL VASCULAR CATHETERIZATION: SHX172C

## 2016-04-18 LAB — BASIC METABOLIC PANEL WITH GFR
Anion gap: 9 (ref 5–15)
BUN: 8 mg/dL (ref 6–20)
CO2: 24 mmol/L (ref 22–32)
Calcium: 10.1 mg/dL (ref 8.9–10.3)
Chloride: 100 mmol/L — ABNORMAL LOW (ref 101–111)
Creatinine, Ser: 0.54 mg/dL (ref 0.44–1.00)
GFR calc Af Amer: >60 mL/min
GFR calc non Af Amer: >60 mL/min
Glucose, Bld: 218 mg/dL — ABNORMAL HIGH (ref 65–99)
Potassium: 4.2 mmol/L (ref 3.5–5.1)
Sodium: 133 mmol/L — ABNORMAL LOW (ref 135–145)

## 2016-04-18 LAB — CBC
HCT: 44.4 % (ref 36.0–46.0)
Hemoglobin: 15 g/dL (ref 12.0–15.0)
MCH: 29.3 pg (ref 26.0–34.0)
MCHC: 33.8 g/dL (ref 30.0–36.0)
MCV: 86.7 fL (ref 78.0–100.0)
Platelets: 356 K/uL (ref 150–400)
RBC: 5.12 MIL/uL — ABNORMAL HIGH (ref 3.87–5.11)
RDW: 13.2 % (ref 11.5–15.5)
WBC: 15 K/uL — ABNORMAL HIGH (ref 4.0–10.5)

## 2016-04-18 LAB — PREGNANCY, URINE: Preg Test, Ur: NEGATIVE

## 2016-04-18 LAB — PROTIME-INR
INR: 0.94 (ref 0.00–1.49)
Prothrombin Time: 12.8 s (ref 11.6–15.2)

## 2016-04-18 LAB — GLUCOSE, CAPILLARY: GLUCOSE-CAPILLARY: 222 mg/dL — AB (ref 65–99)

## 2016-04-18 SURGERY — RENAL ANGIOGRAPHY
Anesthesia: LOCAL

## 2016-04-18 MED ORDER — MIDAZOLAM HCL 2 MG/2ML IJ SOLN
INTRAMUSCULAR | Status: AC
  Filled 2016-04-18: qty 2

## 2016-04-18 MED ORDER — LIDOCAINE HCL (PF) 1 % IJ SOLN
INTRAMUSCULAR | Status: DC | PRN
  Administered 2016-04-18: 30 mL

## 2016-04-18 MED ORDER — ASPIRIN 81 MG PO CHEW
CHEWABLE_TABLET | ORAL | Status: AC
  Administered 2016-04-18: 81 mg via ORAL
  Filled 2016-04-18: qty 1

## 2016-04-18 MED ORDER — FENTANYL CITRATE (PF) 100 MCG/2ML IJ SOLN
INTRAMUSCULAR | Status: AC
  Filled 2016-04-18: qty 2

## 2016-04-18 MED ORDER — SODIUM CHLORIDE 0.9% FLUSH: 3.0000 mL | INTRAVENOUS | Status: DC | PRN

## 2016-04-18 MED ORDER — IODIXANOL 320 MG/ML IV SOLN
INTRAVENOUS | Status: DC | PRN
  Administered 2016-04-18: 25 mL via INTRAVENOUS

## 2016-04-18 MED ORDER — LABETALOL HCL 5 MG/ML IV SOLN
INTRAVENOUS | Status: AC
  Filled 2016-04-18: qty 4

## 2016-04-18 MED ORDER — FENTANYL CITRATE (PF) 100 MCG/2ML IJ SOLN
INTRAMUSCULAR | Status: DC | PRN
  Administered 2016-04-18 (×2): 25 ug via INTRAVENOUS

## 2016-04-18 MED ORDER — MIDAZOLAM HCL 2 MG/2ML IJ SOLN
INTRAMUSCULAR | Status: DC | PRN
  Administered 2016-04-18: 1 mg via INTRAVENOUS
  Administered 2016-04-18: 2 mg via INTRAVENOUS

## 2016-04-18 MED ORDER — HEPARIN (PORCINE) IN NACL 2-0.9 UNIT/ML-% IJ SOLN
INTRAMUSCULAR | Status: DC | PRN
  Administered 2016-04-18: 1000 mL

## 2016-04-18 MED ORDER — SODIUM CHLORIDE 0.9 % IV SOLN
INTRAVENOUS | Status: DC
  Administered 2016-04-18: 10:00:00 via INTRAVENOUS

## 2016-04-18 MED ORDER — SODIUM CHLORIDE 0.9 % IV SOLN: 250.0000 mL | INTRAVENOUS | Status: DC | PRN

## 2016-04-18 MED ORDER — SODIUM CHLORIDE 0.9% FLUSH: 3.0000 mL | Freq: Two times a day (BID) | INTRAVENOUS | Status: DC

## 2016-04-18 MED ORDER — SODIUM CHLORIDE 0.9 % IV SOLN: INTRAVENOUS | Status: DC

## 2016-04-18 MED ORDER — LABETALOL HCL 5 MG/ML IV SOLN
INTRAVENOUS | Status: DC | PRN
  Administered 2016-04-18: 20 mg via INTRAVENOUS

## 2016-04-18 MED ORDER — HEPARIN (PORCINE) IN NACL 2-0.9 UNIT/ML-% IJ SOLN
INTRAMUSCULAR | Status: AC
  Filled 2016-04-18: qty 500

## 2016-04-18 MED ORDER — LIDOCAINE HCL (PF) 1 % IJ SOLN
INTRAMUSCULAR | Status: AC
  Filled 2016-04-18: qty 30

## 2016-04-18 MED ORDER — ASPIRIN 81 MG PO CHEW
81.0000 mg | CHEWABLE_TABLET | Freq: Once | ORAL | Status: AC
  Administered 2016-04-18: 81 mg via ORAL

## 2016-04-18 SURGICAL SUPPLY — 10 items
CATH ANGIO 5F PIGTAIL 65CM (CATHETERS) ×1 IMPLANT
DEVICE CLOSURE MYNXGRIP 6/7F (Vascular Products) ×1 IMPLANT
KIT PV (KITS) ×2 IMPLANT
SHEATH PINNACLE 6F 10CM (SHEATH) ×1 IMPLANT
STOPCOCK MORSE 400PSI 3WAY (MISCELLANEOUS) ×1 IMPLANT
SYRINGE MEDRAD AVANTA MACH 7 (SYRINGE) ×1 IMPLANT
TRANSDUCER W/STOPCOCK (MISCELLANEOUS) ×2 IMPLANT
TRAY PV CATH (CUSTOM PROCEDURE TRAY) ×2 IMPLANT
TUBING CIL FLEX 10 FLL-RA (TUBING) ×1 IMPLANT
WIRE HITORQ VERSACORE ST 145CM (WIRE) ×1 IMPLANT

## 2016-04-18 NOTE — H&P (View-Only) (Signed)
Cardiology Office Note   Date:  04/06/2016   ID:  RAFEEF [name], DOB 1971/10/22, MRN [id number]  PCP:  Arnette Norris, MD Cardiologist:   [name], MD   Chief Complaint  Patient presents with  . other    Discuss results c/o sob with exertion. Meds reviewed verbally with pt.      History of Present Illness: Erika [name] is a 45 y.o. female who presents for a follow-up visit regarding refractory hypertension and chest pain. She She has history of hypertension for 18 years.   She suffers from significant anxiety and depression. Her blood pressure gradually started increasing last year and became uncontrolled again. She had one hospitalization for hypotension and bradycardia with borderline elevated lactic acid. She followed up with Dr. Deborra Medina.  workup for pheochromocytoma was negative.  During last visit, I increased the dose of carvedilol to 25 mg twice daily. I proceeded with renal artery duplex which showed evidence of significant right renal artery stenosis and mild left renal artery stenosis. Renin was mildly elevated with normal aldosterone. The patient was seen recently by Dr. Holley Raring for mild proteinuria. She reports improvement in blood pressure but she did not take her medications today. Blood pressure today is 188/110. She continues to complain of dyspnea with minimal activities without chest pain.    Past Medical History  Diagnosis Date  . Essential hypertension, benign   . Other and unspecified hyperlipidemia   . Unspecified essential hypertension   . Arthralgia of temporomandibular joint   . PTSD (post-traumatic stress disorder)   . Depression   . Fatty liver disease, nonalcoholic Q000111Q  . Type 2 diabetes mellitus [facility]) January 2017    Past Surgical History  Procedure Laterality Date  . Cholecystectomy    . Tonsillectomy    . Cesarean section       Current Outpatient Prescriptions  Medication Sig Dispense Refill  . aspirin EC 81 MG tablet Take  1 tablet (81 mg total) by mouth daily. 90 tablet 3  . carvedilol (COREG) 25 MG tablet Take 1 tablet (25 mg total) by mouth 2 (two) times daily. 60 tablet 3  . clonazePAM (KLONOPIN) 0.5 MG tablet Take 1 tablet (0.5 mg total) by mouth 3 (three) times daily. 90 tablet 2  . enalapril (VASOTEC) 20 MG tablet Take 1 tablet (20 mg total) by mouth daily. 30 tablet 5  . FLUoxetine (PROZAC) 20 MG capsule Take 1 capsule (20 mg total) by mouth daily. 30 capsule 2  . hydrochlorothiazide (HYDRODIURIL) 25 MG tablet TAKE 1 pill daily 30 tablet 2  . HYDROcodone-acetaminophen (NORCO) 5-325 MG tablet Take 1 tablet by mouth every 6 (six) hours as needed for moderate pain. 30 tablet 0  . lamoTRIgine (LAMICTAL) 150 MG tablet Take 1 tablet (150 mg total) by mouth daily. 30 tablet 2  . metFORMIN (GLUCOPHAGE) 500 MG tablet Take 1 tablet (500 mg total) by mouth daily with breakfast. (Patient taking differently: Take 500 mg by mouth 2 (two) times daily with a meal. ) 30 tablet 3  . simvastatin (ZOCOR) 20 MG tablet TAKE 1 TABLET (20 MG TOTAL) BY MOUTH EVERY EVENING. 30 tablet 5  . verapamil (CALAN) 120 MG tablet Take 1 tablet (120 mg total) by mouth 3 (three) times daily. 90 tablet 3   No current facility-administered medications for this visit.    Allergies:   Review of patient's allergies indicates no known allergies.    Social History:  The patient  reports that she  has been smoking Cigarettes.  She has been smoking about 0.50 packs per day. She has never used smokeless tobacco. She reports that she does not drink alcohol or use illicit drugs.   Family History:  The patient's family history includes Alcohol abuse in her father; Anxiety disorder in her sister; Diabetes in her father and mother; Drug abuse in her father; Heart disease in her father; Stroke in her sister. She was adopted.    ROS:  Please see the history of present illness.   Otherwise, review of systems are positive for none.   All other systems are  reviewed and negative.    PHYSICAL EXAM: VS:  BP 188/110 mmHg  Pulse 91  Ht 5\' 2"  (1.575 m)  Wt 220 lb 8 oz (100.018 kg)  BMI 40.32 kg/m2 , BMI Body mass index is 40.32 kg/(m^2). GEN: Well nourished, well developed, in no acute distress HEENT: normal Neck: no JVD, carotid bruits, or masses Cardiac: RRR; no  rubs, or gallops,no edema . There is 2 out of 6 systolic flow murmur in the pulmonic area Respiratory:  clear to auscultation bilaterally, normal work of breathing GI: soft, nontender, nondistended, + BS MS: no deformity or atrophy Skin: warm and dry, no rash Neuro:  Strength and sensation are intact Psych: euthymic mood, full affect   EKG:  EKG is ordered today. The ekg ordered today demonstrates   Normal sinus rhythm with lateral T wave changes suggestive of ischemia and prolonged QT interval.   Recent Labs: 12/08/2015: ALT 35; TSH 2.25 03/05/2016: BUN 14; Creatinine, Ser 0.85; Hemoglobin 15.4*; Platelets 405*; Potassium 3.7; Sodium 134*    Lipid Panel    Component Value Date/Time   CHOL 252* 04/22/2012 0809   TRIG 395.0* 04/22/2012 0809   HDL 27.80* 04/22/2012 0809   CHOLHDL 9 04/22/2012 0809   VLDL 79.0* 04/22/2012 0809   LDLDIRECT 177.1 04/22/2012 0809      Wt Readings from Last 3 Encounters:  04/06/16 220 lb 8 oz (100.018 kg)  03/19/16 215 lb 9.6 oz (97.796 kg)  03/15/16 219 lb 8 oz (99.565 kg)         ASSESSMENT AND PLAN:  1.   Renovascular hypertension:  The patient continues to have uncontrolled hypertension in spite of  5 different blood pressure medications. This is very unusual especially at her age. Recent workup for pheochromocytoma was negative.  Renal artery duplex was suggestive of significant right renal artery stenosis. Renin was mildly elevated with normal aldosterone. This can be seen in renal artery stenosis. There was also mild proteinuria. Due to all of this, I recommend proceeding with renal artery angiography and possible  endovascular intervention. I discussed risks and benefits with her.    2. Dyspnea and chest pain:   The patient continues to have significant exertional dyspnea and occasional chest pain. Echocardiogram showed normal LV systolic function with no significant valvular abnormalities. I requested a pharmacologic nuclear stress test for evaluation before her renal artery angiography in case cardiac catheterization is needed at the same time. Given that her blood pressure is still not controlled, I do not recommend a treadmill stress test.    Disposition:   FU with me in 1 month  Signed, [name], MD  04/06/2016 6:55 PM    [place]

## 2016-04-18 NOTE — Discharge Instructions (Signed)
Resume Metformin in 2 days.    Angiogram, Care After Refer to this sheet in the next few weeks. These instructions provide you with information about caring for yourself after your procedure. Your health care provider may also give you more specific instructions. Your treatment has been planned according to current medical practices, but problems sometimes occur. Call your health care provider if you have any problems or questions after your procedure. WHAT TO EXPECT AFTER THE PROCEDURE After your procedure, it is typical to have the following:  Bruising at the catheter insertion site that usually fades within 1-2 weeks.  Blood collecting in the tissue (hematoma) that may be painful to the touch. It should usually decrease in size and tenderness within 1-2 weeks. HOME CARE INSTRUCTIONS  Take medicines only as directed by your health care provider.  You may shower 24-48 hours after the procedure or as directed by your health care provider. Remove the bandage (dressing) and gently wash the site with plain soap and water. Pat the area dry with a clean towel. Do not rub the site, because this may cause bleeding.  Do not take baths, swim, or use a hot tub until your health care provider approves.  Check your insertion site every day for redness, swelling, or drainage.  Do not apply powder or lotion to the site.  Do not lift over 10 lb (4.5 kg) for 5 days after your procedure or as directed by your health care provider.  Ask your health care provider when it is okay to:  Return to work or school.  Resume usual physical activities or sports.  Resume sexual activity.  Do not drive home if you are discharged the same day as the procedure. Have someone else drive you.  You may drive 24 hours after the procedure unless otherwise instructed by your health care provider.  Do not operate machinery or power tools for 24 hours after the procedure or as directed by your health care  provider.  If your procedure was done as an outpatient procedure, which means that you went home the same day as your procedure, a responsible adult should be with you for the first 24 hours after you arrive home.  Keep all follow-up visits as directed by your health care provider. This is important. SEEK MEDICAL CARE IF:  You have a fever.  You have chills.  You have increased bleeding from the catheter insertion site. Hold pressure on the site. SEEK IMMEDIATE MEDICAL CARE IF:  You have unusual pain at the catheter insertion site.  You have redness, warmth, or swelling at the catheter insertion site.  You have drainage (other than a small amount of blood on the dressing) from the catheter insertion site.  The catheter insertion site is bleeding, and the bleeding does not stop after 30 minutes of holding steady pressure on the site.  The area near or just beyond the catheter insertion site becomes pale, cool, tingly, or numb.   This information is not intended to replace advice given to you by your health care provider. Make sure you discuss any questions you have with your health care provider.   Document Released: 06/28/2005 Document Revised: 12/31/2014 Document Reviewed: 05/13/2013 Elsevier Interactive Patient Education [employer].

## 2016-04-18 NOTE — Interval H&P Note (Signed)
History and Physical Interval Note:  04/18/2016 10:53 AM  Erika [name]  has presented today for surgery, with the diagnosis of pvd  The various methods of treatment have been discussed with the patient and family. After consideration of risks, benefits and other options for treatment, the patient has consented to  Procedure(s): Renal Angiography ([name]) as a surgical intervention .  The patient's history has been reviewed, patient examined, no change in status, stable for surgery.  I have reviewed the patient's chart and labs.  Questions were answered to the patient's satisfaction.     [name]

## 2016-04-19 ENCOUNTER — Encounter (HOSPITAL_COMMUNITY): Payer: Self-pay | Admitting: Cardiovascular Disease

## 2016-04-20 ENCOUNTER — Encounter (HOSPITAL_COMMUNITY): Payer: Self-pay | Admitting: Psychiatry

## 2016-04-20 ENCOUNTER — Ambulatory Visit (INDEPENDENT_AMBULATORY_CARE_PROVIDER_SITE_OTHER): Payer: BLUE CROSS/BLUE SHIELD | Admitting: Psychiatry

## 2016-04-20 DIAGNOSIS — F331 Major depressive disorder, recurrent, moderate: Secondary | ICD-10-CM | POA: Diagnosis not present

## 2016-04-20 NOTE — Patient Instructions (Signed)
Discussed orally 

## 2016-04-20 NOTE — Progress Notes (Signed)
      THERAPIST PROGRESS NOTE  Session Time:      Friday 04/20/2016 3:05 PM - 3:56 PM        Participation Level: Active  Behavioral Response: CasualAlertAngry, Anxious and Depressed/Tearful  Type of Therapy: Individual Therapy  Treatment Goals addressed:   1. Verbalize an understanding of the rationale for treatment of depression.       2. Learn and implement behavioral strategies to overcome depression.       3. Learn and implement calming skills to reduce/manage overall anxiety.  Interventions: Supportive  Summary: Erika [name] is a 45 y.o. female who presents with a long standing history of symptoms of anxiety and depression. She reports increased emotionality and anger outbursts since Thanksgiving. Cuuent stress includes father who has dementia being in the hospital. His health is declining rapidly. She is having conflict with sister about future plans for father's continued care. She reports financial stress as husband is the only one who works. They are behind on bills. Patient also reports stress related to multiple health issues.  Symptoms include anxiety, depressed mood, worry, panic attacks, explosive anger outbursts, and insomnia.  Patient reports continued stress, anxiety, and depression since last session. She learned earlier this week there was a misdiagnosis regarding blockage in her renal artery. She expresses frustration and disappointment as she thought a procedure to correct the blockage would address the issues she has been experiencing. She reports increased negative thinking and feelings of hopelessness as well as helplessness. She also reports crying spells.  Suicidal/Homicidal: No  Therapist Response:  Reviewed symptoms, facilitated expression of feelings, discussed the effects of stress, depression, anxiety, on pain, discussed the connection between thoughts/mood/behavior, discuss patient's thought patterns and distinguished "clean suffering" from "dirty  suffering", assisted patient identify ways to reframe negative thinking patterns, discussed rationale for and practice mindfulness exercise using breath awareness  Plan: Return again in 1-2 weeks. Patient agrees to practice mindfulness exercise using breath awareness       Diagnosis: Axis I: MDD    Axis II: No diagnosis    Erika Roorda, LCSW 04/20/2016

## 2016-04-23 ENCOUNTER — Telehealth: Payer: Self-pay | Admitting: Cardiovascular Disease

## 2016-04-23 NOTE — Telephone Encounter (Signed)
Per pt verbal request, notes from renal angiography routed to Dr. Anthonette Legato at [facility]

## 2016-05-03 ENCOUNTER — Encounter: Payer: Self-pay | Admitting: Family Medicine

## 2016-05-14 ENCOUNTER — Telehealth: Payer: Self-pay | Admitting: Cardiovascular Disease

## 2016-05-14 ENCOUNTER — Ambulatory Visit (INDEPENDENT_AMBULATORY_CARE_PROVIDER_SITE_OTHER): Payer: BLUE CROSS/BLUE SHIELD | Admitting: Family Medicine

## 2016-05-14 ENCOUNTER — Encounter: Payer: Self-pay | Admitting: Family Medicine

## 2016-05-14 ENCOUNTER — Ambulatory Visit (INDEPENDENT_AMBULATORY_CARE_PROVIDER_SITE_OTHER): Payer: BLUE CROSS/BLUE SHIELD | Admitting: Cardiovascular Disease

## 2016-05-14 ENCOUNTER — Ambulatory Visit: Payer: Self-pay | Admitting: Family Medicine

## 2016-05-14 ENCOUNTER — Encounter: Payer: Self-pay | Admitting: Cardiovascular Disease

## 2016-05-14 VITALS — BP 200/120 | HR 94 | Ht 67.0 in | Wt 221.0 lb

## 2016-05-14 VITALS — BP 168/102 | HR 83 | Temp 98.1°F | Wt 220.2 lb

## 2016-05-14 DIAGNOSIS — I10 Essential (primary) hypertension: Secondary | ICD-10-CM | POA: Diagnosis not present

## 2016-05-14 DIAGNOSIS — G44009 Cluster headache syndrome, unspecified, not intractable: Secondary | ICD-10-CM

## 2016-05-14 DIAGNOSIS — I701 Atherosclerosis of renal artery: Secondary | ICD-10-CM

## 2016-05-14 MED ORDER — SPIRONOLACTONE 25 MG PO TABS
25.0000 mg | ORAL_TABLET | Freq: Every day | ORAL | Status: DC
Start: 2016-05-14 — End: 2016-08-14

## 2016-05-14 MED ORDER — HYDROCODONE-ACETAMINOPHEN 5-325 MG PO TABS: 1.0000 | ORAL_TABLET | Freq: Four times a day (QID) | ORAL | Status: DC | PRN

## 2016-05-14 NOTE — Assessment & Plan Note (Signed)
Remains very difficult to control. >25 minutes spent in face to face time with patient, >50% spent in counselling or coordination of care ? Denervation study at [facility]- Dr. Fletcher Anon to discuss this with her later today.

## 2016-05-14 NOTE — Progress Notes (Signed)
Cardiology Office Note   Date:  05/14/2016   ID:  Erika [name], DOB April 04, 1971, MRN [id number]  PCP:  Arnette Norris, MD Cardiologist:   [name], MD   Chief Complaint  Patient presents with  . other    1 month f/u. Meds reviewed verbally.      History of Present Illness: Erika [name] is a 45 y.o. female who presents for a follow-up visit regarding refractory hypertension and chest pain. She She has history of hypertension for 18 years.   She suffers from significant anxiety and depression. Her blood pressure gradually started increasing last year and became uncontrolled again. She had one hospitalization for hypotension and bradycardia with borderline elevated lactic acid. She followed up with Dr. Deborra Medina.  workup for pheochromocytoma was negative.   Workup for secondary hypertension has been negative. Aldosterone to renin ratio was normal. Renal artery duplex was suggestive of significant right renal artery stenosis. However, I proceeded recently with angiography which showed minimal disease involving the right renal artery and normal left renal artery. The patient was seen recently by Dr. Holley Raring for mild proteinuria. She had negative work up.  Lexiscan nuclear stress test in April was normal. She continues to be very frustrated with elevated blood pressure in spite of taking her medications regularly. She reports strong family history of malignant hypertension.    Past Medical History  Diagnosis Date  . Essential hypertension, benign   . Other and unspecified hyperlipidemia   . Unspecified essential hypertension   . Arthralgia of temporomandibular joint   . PTSD (post-traumatic stress disorder)   . Depression   . Fatty liver disease, nonalcoholic Q000111Q  . Type 2 diabetes mellitus [facility]) January 2017    Past Surgical History  Procedure Laterality Date  . Cholecystectomy    . Tonsillectomy    . Cesarean section    . Peripheral vascular catheterization [name]  04/18/2016    Procedure: Renal Angiography;  Surgeon: Wellington Hampshire, MD;  Location: [place] [facility];  Service: Cardiovascular;  Laterality: [name];     Current Outpatient Prescriptions  Medication Sig Dispense Refill  . aspirin EC 81 MG tablet Take 1 tablet (81 mg total) by mouth daily. 90 tablet 3  . carvedilol (COREG) 25 MG tablet Take 1 tablet (25 mg total) by mouth 2 (two) times daily. 60 tablet 3  . clonazePAM (KLONOPIN) 0.5 MG tablet Take 1 tablet (0.5 mg total) by mouth 3 (three) times daily. 90 tablet 2  . enalapril (VASOTEC) 20 MG tablet Take 1 tablet (20 mg total) by mouth daily. 30 tablet 5  . FLUoxetine (PROZAC) 20 MG capsule Take 1 capsule (20 mg total) by mouth daily. 30 capsule 2  . hydrochlorothiazide (HYDRODIURIL) 25 MG tablet TAKE 1 pill daily (Patient taking differently: Take 25 mg by mouth daily. TAKE 1 pill daily) 30 tablet 2  . HYDROcodone-acetaminophen (NORCO) 5-325 MG tablet Take 1 tablet by mouth every 6 (six) hours as needed for moderate pain. 90 tablet 0  . lamoTRIgine (LAMICTAL) 150 MG tablet Take 1 tablet (150 mg total) by mouth daily. 30 tablet 2  . metFORMIN (GLUCOPHAGE) 500 MG tablet Take 1 tablet (500 mg total) by mouth daily with breakfast. (Patient taking differently: Take 500 mg by mouth 2 (two) times daily with a meal. ) 30 tablet 3  . simvastatin (ZOCOR) 20 MG tablet TAKE 1 TABLET (20 MG TOTAL) BY MOUTH EVERY EVENING. (Patient taking differently: Take 20 mg by mouth daily. TAKE  1 TABLET (20 MG TOTAL) BY MOUTH EVERY EVENING.) 30 tablet 5  . verapamil (CALAN) 120 MG tablet Take 1 tablet (120 mg total) by mouth 3 (three) times daily. 90 tablet 3   No current facility-administered medications for this visit.    Allergies:   Review of patient's allergies indicates no known allergies.    Social History:  The patient  reports that she has been smoking Cigarettes.  She has been smoking about 0.50 packs per day. She has never used smokeless tobacco. She reports  that she does not drink alcohol or use illicit drugs.   Family History:  The patient's family history includes Alcohol abuse in her father; Anxiety disorder in her sister; Diabetes in her father and mother; Drug abuse in her father; Heart disease in her father; Stroke in her sister. She was adopted.    ROS:  Please see the history of present illness.   Otherwise, review of systems are positive for none.   All other systems are reviewed and negative.    PHYSICAL EXAM: VS:  BP 200/120 mmHg  Pulse 94  Ht 5\' 7"  (1.702 m)  Wt 221 lb (100.245 kg)  BMI 34.61 kg/m2  LMP 04/24/2016 , BMI Body mass index is 34.61 kg/(m^2). GEN: Well nourished, well developed, in no acute distress HEENT: normal Neck: no JVD, carotid bruits, or masses Cardiac: RRR; no  rubs, or gallops,no edema . There is 2 out of 6 systolic flow murmur in the pulmonic area Respiratory:  clear to auscultation bilaterally, normal work of breathing GI: soft, nontender, nondistended, + BS MS: no deformity or atrophy Skin: warm and dry, no rash Neuro:  Strength and sensation are intact Psych: euthymic mood, full affect Right groin is intact with no hematoma   EKG:  EKG is not ordered today.    Recent Labs: 12/08/2015: ALT 35; TSH 2.25 04/18/2016: BUN 8; Creatinine, Ser 0.54; Hemoglobin 15.0; Platelets 356; Potassium 4.2; Sodium 133*    Lipid Panel    Component Value Date/Time   CHOL 252* 04/22/2012 0809   TRIG 395.0* 04/22/2012 0809   HDL 27.80* 04/22/2012 0809   CHOLHDL 9 04/22/2012 0809   VLDL 79.0* 04/22/2012 0809   LDLDIRECT 177.1 04/22/2012 0809      Wt Readings from Last 3 Encounters:  05/14/16 221 lb (100.245 kg)  05/14/16 220 lb 4 oz (99.905 kg)  04/18/16 217 lb (98.431 kg)         ASSESSMENT AND PLAN:  1.   Refractory malignant hypertension:   Negative workup for secondary hypertension as outlined above. Spironolactone was held in order to perform testing regarding hyperaldosteronism but this came  back negative. I elected to resume spironolactone 25 mg once daily. I asked her to hold verapamil if systolic blood pressure goes below 160 as I don't want her blood pressure to go down quickly. She continues to be on multiple blood pressure medications. I elected to refer her to the [facility] hypertension clinic with Dr. Gennette Pac to see if she is a candidate for renal denervation study.   2. Dyspnea and chest pain:   Likely related to uncontrolled hypertension. Echocardiogram showed normal LV systolic function with no significant valvular abnormalities. Nuclear stress test was normal  Disposition:   FU with me in 3 months  Signed,  [name], MD  05/14/2016 10:39 AM    [place]

## 2016-05-14 NOTE — Patient Instructions (Signed)
Medication Instructions:  Your physician has recommended you make the following change in your medication:  START taking spironolactone 25mg  once daily HOLD verapamil if systolic BP is less than 0000000.   Labwork: BMET in one week  Testing/Procedures: none  Follow-Up: Your physician recommends that you schedule a follow-up appointment in: 3 months with Dr. Fletcher Anon.   Any Other Special Instructions Will Be Listed Below (If Applicable). Referral to  Dr. Artelia Laroche, III, MD [facility] Division of Cardiology [place], [place] will call you with an appointment      If you need a refill on your cardiac medications before your next appointment, please call your pharmacy.

## 2016-05-14 NOTE — Telephone Encounter (Signed)
Faxed recent OV notes and pt information to [facility] and Vascular Referral, [phone]

## 2016-05-14 NOTE — Progress Notes (Signed)
Subjective:   Patient ID: Erika [name], female    DOB: 05/03/1971, 45 y.o.   MRN: [id number]  Erika [name] is a pleasant 45 y.o. year old female who presents to clinic today with Follow-up  on 05/14/2016  HPI:  HTN- BP has been difficult to control  seeing Dr. Fletcher Anon.  Elevated today but has not taken her blood pressure medication today.   Had Renal artery [name] on 03/12/16- consistent with RAS. Echo reassuring.   Renal angiography 04/18/16-   No significant renal artery stenosis.  Recommendations: The patient has severe refractory hypertension with no evidence of underlying renal artery stenosis or other secondary etiologies. Blood pressure continues to be elevated in spite of multiple antihypertensive medications. I recommend resuming spironolactone in the near future and considering referral to a renal denervation study.  She has follow up appointment with Dr. Fletcher Anon later today to discuss these results and the next step.  She is very tearful and frustrated. Current Outpatient Prescriptions on File Prior to Visit  Medication Sig Dispense Refill  . aspirin EC 81 MG tablet Take 1 tablet (81 mg total) by mouth daily. 90 tablet 3  . carvedilol (COREG) 25 MG tablet Take 1 tablet (25 mg total) by mouth 2 (two) times daily. 60 tablet 3  . clonazePAM (KLONOPIN) 0.5 MG tablet Take 1 tablet (0.5 mg total) by mouth 3 (three) times daily. 90 tablet 2  . enalapril (VASOTEC) 20 MG tablet Take 1 tablet (20 mg total) by mouth daily. 30 tablet 5  . FLUoxetine (PROZAC) 20 MG capsule Take 1 capsule (20 mg total) by mouth daily. 30 capsule 2  . hydrochlorothiazide (HYDRODIURIL) 25 MG tablet TAKE 1 pill daily (Patient taking differently: Take 25 mg by mouth daily. TAKE 1 pill daily) 30 tablet 2  . HYDROcodone-acetaminophen (NORCO) 5-325 MG tablet Take 1 tablet by mouth every 6 (six) hours as needed for moderate pain. 30 tablet 0  . lamoTRIgine (LAMICTAL) 150 MG tablet Take 1 tablet (150 mg  total) by mouth daily. 30 tablet 2  . metFORMIN (GLUCOPHAGE) 500 MG tablet Take 1 tablet (500 mg total) by mouth daily with breakfast. (Patient taking differently: Take 500 mg by mouth 2 (two) times daily with a meal. ) 30 tablet 3  . simvastatin (ZOCOR) 20 MG tablet TAKE 1 TABLET (20 MG TOTAL) BY MOUTH EVERY EVENING. (Patient taking differently: Take 20 mg by mouth daily. TAKE 1 TABLET (20 MG TOTAL) BY MOUTH EVERY EVENING.) 30 tablet 5  . verapamil (CALAN) 120 MG tablet Take 1 tablet (120 mg total) by mouth 3 (three) times daily. 90 tablet 3   No current facility-administered medications on file prior to visit.    No Known Allergies  Past Medical History  Diagnosis Date  . Essential hypertension, benign   . Other and unspecified hyperlipidemia   . Unspecified essential hypertension   . Arthralgia of temporomandibular joint   . PTSD (post-traumatic stress disorder)   . Depression   . Fatty liver disease, nonalcoholic Q000111Q  . Type 2 diabetes mellitus [facility]) January 2017    Past Surgical History  Procedure Laterality Date  . Cholecystectomy    . Tonsillectomy    . Cesarean section    . Peripheral vascular catheterization [name] 04/18/2016    Procedure: Renal Angiography;  Surgeon: Wellington Hampshire, MD;  Location: [place] [facility];  Service: Cardiovascular;  Laterality: [name];    Family History  Problem Relation Age of Onset  . Adopted: Yes  .  Diabetes Mother   . Diabetes Father   . Alcohol abuse Father   . Heart disease Father   . Drug abuse Father   . Stroke Sister   . Anxiety disorder Sister     Social History   Social History  . Marital Status: Married    Spouse Name: [name]  . Number of Children: [name]  . Years of Education: [name]   Occupational History  . Not on file.   Social History Main Topics  . Smoking status: Current Every Day Smoker -- 0.50 packs/day    Types: Cigarettes  . Smokeless tobacco: Never Used  . Alcohol Use: No  . Drug Use: No  . Sexual Activity:  Yes    Birth Control/ Protection: None   Other Topics Concern  . Not on file   Social History Narrative   The PMH, PSH, Social History, Family History, Medications, and allergies have been reviewed in [facility], and have been updated if relevant.   Review of Systems  Constitutional: Positive for fatigue.  Eyes: Negative.   Respiratory: Negative.   Cardiovascular: Negative.   Gastrointestinal: Negative.   Endocrine: Negative.   Genitourinary: Negative.   Musculoskeletal: Positive for myalgias.  Allergic/Immunologic: Negative.   Neurological: Positive for headaches. Negative for light-headedness.  Hematological: Negative.   Psychiatric/Behavioral: Negative.   All other systems reviewed and are negative.      Objective:    BP 168/102 mmHg  Pulse 83  Temp(Src) 98.1 F (36.7 C) (Oral)  Wt 220 lb 4 oz (99.905 kg)  SpO2 94%  LMP 04/24/2016   Physical Exam  Constitutional: She is oriented to person, place, and time. She appears well-developed and well-nourished. No distress.  HENT:  Head: Normocephalic.  Eyes: Conjunctivae are normal.  Cardiovascular: Normal rate.   Pulmonary/Chest: Effort normal.  Musculoskeletal: Normal range of motion.  Neurological: She is alert and oriented to person, place, and time. No cranial nerve deficit.  Skin: Skin is warm and dry. She is not diaphoretic.  Psychiatric: She has a normal mood and affect. Her behavior is normal. Judgment and thought content normal.  Nursing note and vitals reviewed.         Assessment & Plan:   HTN (hypertension), malignant  Renal artery stenosis (HCC) No Follow-up on file.

## 2016-05-14 NOTE — Progress Notes (Signed)
Pre visit review using our clinic review tool, if applicable. No additional management support is needed unless otherwise documented below in the visit note. 

## 2016-05-14 NOTE — Assessment & Plan Note (Signed)
Norco rx refilled and given to pt today.

## 2016-05-22 ENCOUNTER — Other Ambulatory Visit: Payer: Self-pay

## 2016-06-01 ENCOUNTER — Telehealth: Payer: Self-pay | Admitting: Cardiovascular Disease

## 2016-06-01 ENCOUNTER — Other Ambulatory Visit: Payer: Self-pay

## 2016-06-01 NOTE — Telephone Encounter (Signed)
Pt missed 5/30 and 6/9 lab appt which was ordered at 5/22 OV when spironolactone was added. Left message on pt VM to call back and reschedule.

## 2016-06-04 ENCOUNTER — Other Ambulatory Visit: Payer: Self-pay

## 2016-06-04 DIAGNOSIS — I159 Secondary hypertension, unspecified: Secondary | ICD-10-CM

## 2016-06-04 NOTE — Telephone Encounter (Signed)
S/w pt who states she forgot about 6/9 lab appt.  Requests to have BMET drawn at [facility] this week. She verbalized understanding to go to [facility]. Order placed.

## 2016-06-19 ENCOUNTER — Ambulatory Visit (INDEPENDENT_AMBULATORY_CARE_PROVIDER_SITE_OTHER): Payer: BLUE CROSS/BLUE SHIELD | Admitting: Psychiatry

## 2016-06-19 ENCOUNTER — Encounter (HOSPITAL_COMMUNITY): Payer: Self-pay | Admitting: Psychiatry

## 2016-06-19 VITALS — BP 180/110 | HR 98 | Ht 67.0 in | Wt 215.4 lb

## 2016-06-19 DIAGNOSIS — F331 Major depressive disorder, recurrent, moderate: Secondary | ICD-10-CM | POA: Diagnosis not present

## 2016-06-19 MED ORDER — CLONAZEPAM 0.5 MG PO TABS: 0.5000 mg | ORAL_TABLET | Freq: Three times a day (TID) | ORAL | Status: DC

## 2016-06-19 MED ORDER — LAMOTRIGINE 200 MG PO TABS: 200.0000 mg | ORAL_TABLET | Freq: Every day | ORAL | Status: DC

## 2016-06-19 MED ORDER — FLUOXETINE HCL 40 MG PO CAPS: 40.0000 mg | ORAL_CAPSULE | Freq: Every day | ORAL | Status: DC
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
## 2016-06-22 DIAGNOSIS — Z79899 Other long term (current) drug therapy: Secondary | ICD-10-CM | POA: Diagnosis not present

## 2016-06-22 DIAGNOSIS — I10 Essential (primary) hypertension: Secondary | ICD-10-CM | POA: Diagnosis not present

## 2016-06-27 ENCOUNTER — Ambulatory Visit (INDEPENDENT_AMBULATORY_CARE_PROVIDER_SITE_OTHER): Payer: BLUE CROSS/BLUE SHIELD | Admitting: Family Medicine

## 2016-06-27 ENCOUNTER — Emergency Department: Payer: BLUE CROSS/BLUE SHIELD

## 2016-06-27 ENCOUNTER — Ambulatory Visit: Payer: Self-pay | Admitting: Family Medicine

## 2016-06-27 ENCOUNTER — Telehealth: Payer: Self-pay | Admitting: *Deleted

## 2016-06-27 ENCOUNTER — Ambulatory Visit (INDEPENDENT_AMBULATORY_CARE_PROVIDER_SITE_OTHER)
Admission: RE | Admit: 2016-06-27 | Discharge: 2016-06-27 | Disposition: A | Discharge status: Home / Self Care | Payer: BLUE CROSS/BLUE SHIELD | Source: Ambulatory Visit | Attending: Family Medicine | Admitting: Family Medicine

## 2016-06-27 ENCOUNTER — Encounter: Payer: Self-pay | Admitting: Family Medicine

## 2016-06-27 ENCOUNTER — Inpatient Hospital Stay
Admission: EM | Admit: 2016-06-27 | Discharge: 2016-06-29 | DRG: 282 | Disposition: A | Discharge status: Other Acute Inpatient Hospital | Payer: BLUE CROSS/BLUE SHIELD | Attending: Internal Medicine | Admitting: Internal Medicine

## 2016-06-27 ENCOUNTER — Encounter: Payer: Self-pay | Admitting: *Deleted

## 2016-06-27 ENCOUNTER — Inpatient Hospital Stay (HOSPITAL_COMMUNITY)
Admit: 2016-06-27 | Discharge: 2016-06-27 | Disposition: A | Discharge status: Home / Self Care | Payer: BLUE CROSS/BLUE SHIELD | Attending: Internal Medicine | Admitting: Internal Medicine

## 2016-06-27 VITALS — BP 190/110 | HR 97 | Temp 98.3°F | Wt 215.0 lb

## 2016-06-27 DIAGNOSIS — R079 Chest pain, unspecified: Secondary | ICD-10-CM

## 2016-06-27 DIAGNOSIS — I2510 Atherosclerotic heart disease of native coronary artery without angina pectoris: Secondary | ICD-10-CM | POA: Diagnosis present

## 2016-06-27 DIAGNOSIS — F17210 Nicotine dependence, cigarettes, uncomplicated: Secondary | ICD-10-CM | POA: Diagnosis not present

## 2016-06-27 DIAGNOSIS — I214 Non-ST elevation (NSTEMI) myocardial infarction: Secondary | ICD-10-CM | POA: Diagnosis not present

## 2016-06-27 DIAGNOSIS — I700 Atherosclerosis of aorta: Secondary | ICD-10-CM | POA: Diagnosis not present

## 2016-06-27 DIAGNOSIS — I10 Essential (primary) hypertension: Secondary | ICD-10-CM | POA: Diagnosis present

## 2016-06-27 DIAGNOSIS — Z7984 Long term (current) use of oral hypoglycemic drugs: Secondary | ICD-10-CM

## 2016-06-27 DIAGNOSIS — E8881 Metabolic syndrome: Secondary | ICD-10-CM | POA: Diagnosis not present

## 2016-06-27 DIAGNOSIS — Z7982 Long term (current) use of aspirin: Secondary | ICD-10-CM | POA: Diagnosis not present

## 2016-06-27 DIAGNOSIS — R7989 Other specified abnormal findings of blood chemistry: Secondary | ICD-10-CM | POA: Diagnosis present

## 2016-06-27 DIAGNOSIS — E119 Type 2 diabetes mellitus without complications: Secondary | ICD-10-CM

## 2016-06-27 DIAGNOSIS — F329 Major depressive disorder, single episode, unspecified: Secondary | ICD-10-CM | POA: Diagnosis not present

## 2016-06-27 DIAGNOSIS — R911 Solitary pulmonary nodule: Secondary | ICD-10-CM | POA: Diagnosis not present

## 2016-06-27 DIAGNOSIS — R748 Abnormal levels of other serum enzymes: Secondary | ICD-10-CM | POA: Diagnosis not present

## 2016-06-27 DIAGNOSIS — E785 Hyperlipidemia, unspecified: Secondary | ICD-10-CM | POA: Diagnosis present

## 2016-06-27 DIAGNOSIS — Z8249 Family history of ischemic heart disease and other diseases of the circulatory system: Secondary | ICD-10-CM | POA: Diagnosis not present

## 2016-06-27 DIAGNOSIS — F4310 Post-traumatic stress disorder, unspecified: Secondary | ICD-10-CM | POA: Diagnosis not present

## 2016-06-27 DIAGNOSIS — Z6833 Body mass index (BMI) 33.0-33.9, adult: Secondary | ICD-10-CM

## 2016-06-27 DIAGNOSIS — Z720 Tobacco use: Secondary | ICD-10-CM | POA: Diagnosis not present

## 2016-06-27 DIAGNOSIS — Z833 Family history of diabetes mellitus: Secondary | ICD-10-CM

## 2016-06-27 DIAGNOSIS — Z79899 Other long term (current) drug therapy: Secondary | ICD-10-CM

## 2016-06-27 DIAGNOSIS — R778 Other specified abnormalities of plasma proteins: Secondary | ICD-10-CM

## 2016-06-27 DIAGNOSIS — E0859 Diabetes mellitus due to underlying condition with other circulatory complications: Secondary | ICD-10-CM | POA: Insufficient documentation

## 2016-06-27 DIAGNOSIS — E1169 Type 2 diabetes mellitus with other specified complication: Secondary | ICD-10-CM | POA: Insufficient documentation

## 2016-06-27 DIAGNOSIS — E1165 Type 2 diabetes mellitus with hyperglycemia: Secondary | ICD-10-CM | POA: Diagnosis not present

## 2016-06-27 DIAGNOSIS — K5790 Diverticulosis of intestine, part unspecified, without perforation or abscess without bleeding: Secondary | ICD-10-CM | POA: Diagnosis not present

## 2016-06-27 DIAGNOSIS — F17201 Nicotine dependence, unspecified, in remission: Secondary | ICD-10-CM | POA: Diagnosis present

## 2016-06-27 HISTORY — DX: Obesity, unspecified: E66.9

## 2016-06-27 HISTORY — DX: Tobacco use: Z72.0

## 2016-06-27 HISTORY — DX: Hyperlipidemia, unspecified: E78.5

## 2016-06-27 LAB — COMPREHENSIVE METABOLIC PANEL
ALBUMIN: 4.1 g/dL (ref 3.5–5.2)
ALK PHOS: 76 U/L (ref 39–117)
ALT: 39 U/L — AB (ref 0–35)
AST: 32 U/L (ref 0–37)
BILIRUBIN TOTAL: 0.3 mg/dL (ref 0.2–1.2)
BUN: 6 mg/dL (ref 6–23)
CO2: 30 mEq/L (ref 19–32)
CREATININE: 0.74 mg/dL (ref 0.40–1.20)
Calcium: 10.5 mg/dL (ref 8.4–10.5)
Chloride: 98 mEq/L (ref 96–112)
GFR: 91.23 mL/min (ref >60.00)
GLUCOSE: 192 mg/dL — AB (ref 70–99)
POTASSIUM: 3.7 meq/L (ref 3.5–5.1)
SODIUM: 134 meq/L — AB (ref 135–145)
TOTAL PROTEIN: 7.7 g/dL (ref 6.0–8.3)

## 2016-06-27 LAB — CBC
HEMATOCRIT: 44.4 % (ref 35.0–47.0)
Hemoglobin: 15.6 g/dL (ref 12.0–16.0)
MCH: 30.3 pg (ref 26.0–34.0)
MCHC: 35.2 g/dL (ref 32.0–36.0)
MCV: 86.2 fL (ref 80.0–100.0)
Platelets: 424 10*3/uL (ref 150–440)
RBC: 5.15 MIL/uL (ref 3.80–5.20)
RDW: 12.9 % (ref 11.5–14.5)
WBC: 15.5 10*3/uL — AB (ref 3.6–11.0)

## 2016-06-27 LAB — PROTIME-INR
INR: 1.04
Prothrombin Time: 13.8 seconds (ref 11.4–15.0)

## 2016-06-27 LAB — BASIC METABOLIC PANEL
Anion gap: 10 (ref 5–15)
BUN: 7 mg/dL (ref 6–20)
CHLORIDE: 100 mmol/L — AB (ref 101–111)
CO2: 27 mmol/L (ref 22–32)
Calcium: 10.4 mg/dL — ABNORMAL HIGH (ref 8.9–10.3)
Creatinine, Ser: 0.76 mg/dL (ref 0.44–1.00)
GFR calc Af Amer: >60 mL/min (ref >60)
GFR calc non Af Amer: >60 mL/min (ref >60)
Glucose, Bld: 208 mg/dL — ABNORMAL HIGH (ref 65–99)
POTASSIUM: 3.8 mmol/L (ref 3.5–5.1)
SODIUM: 137 mmol/L (ref 135–145)

## 2016-06-27 LAB — GLUCOSE, CAPILLARY: GLUCOSE-CAPILLARY: 164 mg/dL — AB (ref 65–99)

## 2016-06-27 LAB — APTT: APTT: 33 s (ref 24–36)

## 2016-06-27 LAB — TROPONIN I
TNIDX: 0.94 ug/L — AB (ref 0.00–0.06)
Troponin I: 0.81 ng/mL (ref <0.03)
Troponin I: 1.04 ng/mL (ref <0.03)

## 2016-06-27 LAB — D-DIMER, QUANTITATIVE (NOT AT ARMC): D DIMER QUANT: 0.42 ug{FEU}/mL (ref <0.50)

## 2016-06-27 MED ORDER — ASPIRIN 81 MG PO CHEW
324.0000 mg | CHEWABLE_TABLET | Freq: Once | ORAL | Status: AC
  Administered 2016-06-27: 324 mg via ORAL
  Filled 2016-06-27: qty 4

## 2016-06-27 MED ORDER — FLUOXETINE HCL 20 MG PO CAPS
40.0000 mg | ORAL_CAPSULE | Freq: Every day | ORAL | Status: DC
  Administered 2016-06-28: 40 mg via ORAL
  Filled 2016-06-27: qty 2

## 2016-06-27 MED ORDER — CLONAZEPAM 0.5 MG PO TABS
0.5000 mg | ORAL_TABLET | Freq: Three times a day (TID) | ORAL | Status: DC
  Administered 2016-06-27 – 2016-06-28 (×4): 0.5 mg via ORAL
  Filled 2016-06-27 (×5): qty 1

## 2016-06-27 MED ORDER — ACETAMINOPHEN 650 MG RE SUPP: 650.0000 mg | Freq: Four times a day (QID) | RECTAL | Status: DC | PRN

## 2016-06-27 MED ORDER — SIMVASTATIN 20 MG PO TABS
20.0000 mg | ORAL_TABLET | Freq: Every day | ORAL | Status: DC
  Administered 2016-06-27 – 2016-06-28 (×2): 20 mg via ORAL
  Filled 2016-06-27 (×2): qty 1

## 2016-06-27 MED ORDER — LAMOTRIGINE 100 MG PO TABS
200.0000 mg | ORAL_TABLET | Freq: Every day | ORAL | Status: DC
  Administered 2016-06-27 – 2016-06-28 (×2): 200 mg via ORAL
  Filled 2016-06-27 (×2): qty 2

## 2016-06-27 MED ORDER — MORPHINE SULFATE (PF) 2 MG/ML IV SOLN
2.0000 mg | INTRAVENOUS | Status: DC | PRN
Start: 2016-06-27 — End: 2016-06-29
  Administered 2016-06-28 (×2): 2 mg via INTRAVENOUS
  Filled 2016-06-27 (×2): qty 1

## 2016-06-27 MED ORDER — ACETAMINOPHEN 325 MG PO TABS
650.0000 mg | ORAL_TABLET | Freq: Four times a day (QID) | ORAL | Status: DC | PRN
  Administered 2016-06-29: 650 mg via ORAL
  Filled 2016-06-27: qty 2

## 2016-06-27 MED ORDER — NICOTINE 21 MG/24HR TD PT24
21.0000 mg | MEDICATED_PATCH | Freq: Every day | TRANSDERMAL | Status: DC
  Administered 2016-06-27 – 2016-06-28 (×2): 21 mg via TRANSDERMAL
  Filled 2016-06-27 (×2): qty 1

## 2016-06-27 MED ORDER — SPIRONOLACTONE 25 MG PO TABS
25.0000 mg | ORAL_TABLET | Freq: Every day | ORAL | Status: DC
  Administered 2016-06-27 – 2016-06-28 (×2): 25 mg via ORAL
  Filled 2016-06-27 (×2): qty 1

## 2016-06-27 MED ORDER — ASPIRIN EC 81 MG PO TBEC
81.0000 mg | DELAYED_RELEASE_TABLET | Freq: Every day | ORAL | Status: DC
  Administered 2016-06-28 – 2016-06-29 (×2): 81 mg via ORAL
  Filled 2016-06-27 (×2): qty 1

## 2016-06-27 MED ORDER — ONDANSETRON HCL 4 MG/2ML IJ SOLN: 4.0000 mg | Freq: Four times a day (QID) | INTRAMUSCULAR | Status: DC | PRN

## 2016-06-27 MED ORDER — ENALAPRIL MALEATE 10 MG PO TABS
20.0000 mg | ORAL_TABLET | Freq: Every day | ORAL | Status: DC
  Administered 2016-06-28: 20 mg via ORAL
  Filled 2016-06-27 (×3): qty 2

## 2016-06-27 MED ORDER — NITROGLYCERIN 2 % TD OINT
1.0000 [in_us] | TOPICAL_OINTMENT | Freq: Once | TRANSDERMAL | Status: AC
  Administered 2016-06-27: 1 [in_us] via TOPICAL
  Filled 2016-06-27: qty 1

## 2016-06-27 MED ORDER — CARVEDILOL 12.5 MG PO TABS
25.0000 mg | ORAL_TABLET | Freq: Two times a day (BID) | ORAL | Status: DC
  Administered 2016-06-27 – 2016-06-28 (×3): 25 mg via ORAL
  Filled 2016-06-27 (×5): qty 2

## 2016-06-27 MED ORDER — VERAPAMIL HCL 120 MG PO TABS
120.0000 mg | ORAL_TABLET | Freq: Three times a day (TID) | ORAL | Status: DC
  Administered 2016-06-27 – 2016-06-28 (×2): 120 mg via ORAL
  Filled 2016-06-27 (×4): qty 1

## 2016-06-27 MED ORDER — ENOXAPARIN SODIUM 100 MG/ML ~~LOC~~ SOLN
1.0000 mg/kg | Freq: Two times a day (BID) | SUBCUTANEOUS | Status: DC
  Administered 2016-06-27 – 2016-06-28 (×3): 100 mg via SUBCUTANEOUS
  Filled 2016-06-27 (×3): qty 1

## 2016-06-27 MED ORDER — ONDANSETRON HCL 4 MG PO TABS: 4.0000 mg | ORAL_TABLET | Freq: Four times a day (QID) | ORAL | Status: DC | PRN

## 2016-06-27 MED ORDER — OXYCODONE HCL 5 MG PO TABS: 5.0000 mg | ORAL_TABLET | ORAL | Status: DC | PRN

## 2016-06-27 MED ORDER — HYDROCODONE-ACETAMINOPHEN 5-325 MG PO TABS: 1.0000 | ORAL_TABLET | Freq: Four times a day (QID) | ORAL | Status: DC | PRN

## 2016-06-27 MED ORDER — SODIUM CHLORIDE 0.9% FLUSH
3.0000 mL | Freq: Two times a day (BID) | INTRAVENOUS | Status: DC
  Administered 2016-06-27 – 2016-06-28 (×3): 3 mL via INTRAVENOUS

## 2016-06-27 NOTE — ED Provider Notes (Addendum)
[facility] Emergency Department Provider Note  ____________________________________________   I have reviewed the triage vital signs and the nursing notes.   HISTORY  Chief Complaint Abnormal Lab    HPI Erika [name] is a 45 y.o. female with a history of malignant hypertension on multiple different agents, who is compliant with her medications, had a negative stress test in March of this year, history of diabetes, some family history of CAD in her mother's side. She has never had a heart catheterization. She states her baseline blood pressure is about 190/110. Patient states that she has had, for the last 2 weeks almost, episodes of left-sided chest pain radiates around towards her arms and up towards her jaw associated with diaphoresis and vomiting. They last about 20 minutes. They're not exertional per se.  She cannot predict what activities will bring on these pains. Sometimes a happen a few times today. The last time she had one was last night. She states that in between these episodes she is at her baseline. At this time, she has no chest pain shows breath nausea vomiting she feels okay. Her primary care doctor check some blood work and found her to have a troponin of 0.94 and sent him to the emergency room. Patient has not had any aspirin, and again she feels to be at her baseline.  Past Medical History  Diagnosis Date  . Essential hypertension, benign   . Other and unspecified hyperlipidemia   . Unspecified essential hypertension   . Arthralgia of temporomandibular joint   . PTSD (post-traumatic stress disorder)   . Depression   . Fatty liver disease, nonalcoholic [id number]  . Type 2 diabetes mellitus [facility]) January 2017    Patient Active Problem List   Diagnosis Date Noted  . Chest pain 06/27/2016  . Proteinuria 03/15/2016  . Renal artery stenosis ([place]) 03/15/2016  . Neck pain on left side 03/15/2016  . Dizziness and giddiness 12/08/2015  . Lactic  acidosis 12/08/2015  . MDD (major depressive disorder) ([place]) 10/17/2015  . Agoraphobia with panic attacks 04/25/2015  . HTN (hypertension), malignant 10/20/2013  . Cluster headache 03/20/2012  . HYPERLIPIDEMIA 07/26/2010  . ADJUSTMENT DISORDER WITH MIXED FEATURES 07/26/2010    Past Surgical History  Procedure Laterality Date  . Cholecystectomy    . Tonsillectomy    . Cesarean section    . Peripheral vascular catheterization [name] 04/18/2016    Procedure: Renal Angiography;  Surgeon: Wellington Hampshire, MD;  Location: [facility];  Service: Cardiovascular;  Laterality: [name];    Current Outpatient Rx  Name  Route  Sig  Dispense  Refill  . aspirin EC 81 MG tablet   Oral   Take 1 tablet (81 mg total) by mouth daily.   90 tablet   3   . carvedilol (COREG) 25 MG tablet   Oral   Take 1 tablet (25 mg total) by mouth 2 (two) times daily.   60 tablet   3   . clonazePAM (KLONOPIN) 0.5 MG tablet   Oral   Take 1 tablet (0.5 mg total) by mouth 3 (three) times daily.   90 tablet   2   . enalapril (VASOTEC) 20 MG tablet   Oral   Take 1 tablet (20 mg total) by mouth daily.   30 tablet   5   . FLUoxetine (PROZAC) 40 MG capsule   Oral   Take 1 capsule (40 mg total) by mouth daily.   30 capsule  2   . hydrochlorothiazide (HYDRODIURIL) 25 MG tablet      TAKE 1 pill daily Patient taking differently: Take 25 mg by mouth daily. TAKE 1 pill daily   30 tablet   2   . HYDROcodone-acetaminophen (NORCO) 5-325 MG tablet   Oral   Take 1 tablet by mouth every 6 (six) hours as needed for moderate pain.   90 tablet   0   . lamoTRIgine (LAMICTAL) 200 MG tablet   Oral   Take 1 tablet (200 mg total) by mouth daily.   30 tablet   2   . metFORMIN (GLUCOPHAGE) 500 MG tablet   Oral   Take 1 tablet (500 mg total) by mouth daily with breakfast. Patient taking differently: Take 500 mg by mouth 2 (two) times daily with a meal.    30 tablet   3   . simvastatin (ZOCOR) 20 MG tablet       TAKE 1 TABLET (20 MG TOTAL) BY MOUTH EVERY EVENING. Patient taking differently: Take 20 mg by mouth daily. TAKE 1 TABLET (20 MG TOTAL) BY MOUTH EVERY EVENING.   30 tablet   5   . spironolactone (ALDACTONE) 25 MG tablet   Oral   Take 1 tablet (25 mg total) by mouth daily.   30 tablet   3   . verapamil (CALAN) 120 MG tablet   Oral   Take 1 tablet (120 mg total) by mouth 3 (three) times daily.   90 tablet   3     Allergies Review of patient's allergies indicates no known allergies.  Family History  Problem Relation Age of Onset  . Adopted: Yes  . Diabetes Mother   . Diabetes Father   . Alcohol abuse Father   . Heart disease Father   . Drug abuse Father   . Stroke Sister   . Anxiety disorder Sister     Social History Social History  Substance Use Topics  . Smoking status: Current Every Day Smoker -- 0.50 packs/day    Types: Cigarettes  . Smokeless tobacco: Never Used  . Alcohol Use: No    Review of Systems Constitutional: No fever/chills Eyes: No visual changes. ENT: No sore throat. No stiff neck no neck pain Cardiovascular see history of present illness Respiratory: History of present illness Gastrointestinal:   no vomiting.  No diarrhea.  No constipation. Genitourinary: Negative for dysuria. Musculoskeletal: Negative lower extremity swelling Skin: Negative for rash. Neurological: Negative for headaches, focal weakness or numbness. 10-point ROS otherwise negative.  ____________________________________________   PHYSICAL EXAM:  VITAL SIGNS: ED Triage Vitals  Enc Vitals Group     BP 06/27/16 1556 205/120 mmHg     Pulse Rate 06/27/16 1556 102     Resp 06/27/16 1556 18     Temp 06/27/16 1556 98.2 F (36.8 C)     Temp Source 06/27/16 1556 Oral     SpO2 06/27/16 1556 95 %     Weight 06/27/16 1556 215 lb (97.523 kg)     Height 06/27/16 1556 5\' 7"  (1.702 m)     Head Cir --      Peak Flow --      Pain Score --      Pain Loc --      Pain Edu? --       Excl. in [place]? --     Constitutional: Alert and oriented. Well appearing and in no acute distress. Eyes: Conjunctivae are normal. PERRL. EOMI. Head: Atraumatic. Nose: No congestion/rhinnorhea.  Mouth/Throat: Mucous membranes are moist.  Oropharynx non-erythematous. Neck: No stridor.   Nontender with no meningismus Cardiovascular: Normal rate, regular rhythm. Grossly normal heart sounds.  Good peripheral circulation. Respiratory: Normal respiratory effort.  No retractions. Lungs CTAB. Abdominal: Soft and nontender. No distention. No guarding no rebound Back:  There is no focal tenderness or step off there is no midline tenderness there are no lesions noted. there is no CVA tenderness Musculoskeletal: No lower extremity tenderness. No joint effusions, no DVT signs strong distal pulses no edema Neurologic:  Normal speech and language. No gross focal neurologic deficits are appreciated.  Skin:  Skin is warm, dry and intact. No rash noted. Psychiatric: Mood and affect are normal. Speech and behavior are normal.  ____________________________________________   LABS (all labs ordered are listed, but only abnormal results are displayed)  Labs Reviewed  BASIC METABOLIC PANEL  CBC  TROPONIN I   ____________________________________________  EKG  I personally interpreted any EKGs ordered by me or triage EKG shows normal sinus rhythm downsloping ST segments in 2, 3 and aVF with no significant ST depression, also downsloping strain pattern in V4 5 and 6 which has been seen on prior EKGs. AVR has some mild elevation which was also seen on old EKG. No acute ST elevation, normal axis, rate 94 ____________________________________________  RADIOLOGY  I reviewed any imaging ordered by me or triage that were performed during my shift and, if possible, patient and/or family made aware of any abnormal findings. ____________________________________________   PROCEDURES  Procedure(s) performed:  None  Critical Care performed: None  ____________________________________________   INITIAL IMPRESSION / ASSESSMENT AND PLAN / ED COURSE  Pertinent labs & imaging results that were available during my care of the patient were reviewed by me and considered in my medical decision making (see chart for details).  Patient is a symptomatic at this time but has had episodic episodes of chest pain and shortness of breath diaphoresis and vomiting. Has a borderline troponin from outpatient source today. No symptoms at this time. Blood pressures elevated but her baseline blood pressure is exactly where she is, we'll give her aspirin and nitroglycerin. At this time there is no evidence of PE or dissection. Patient's current pressure is 189/110 which is where she usually is. Obviously, very aggressively dropping her blood pressure when she is otherwise a symptomatically not be good for her, as she is quite accustomed to a very high blood pressure. If her chest pain recurs, we may need to intervene more acutely on her blood pressure. In the meantime, we as needed we'll bring him nitro paste and will hopefully forestall further chest pain and may gradually bring her pressure down as well as aspirin. She has multiple EKG under maladies including left strain pattern and some downsloping T waves inferiorly but does not meet criteria for ST elevation myocardial infarction. We will discuss with her cardiologist  ----------------------------------------- 4:38 PM on 06/27/2016 -----------------------------------------  D.w dr. Rockey Situ who agrees with asa, ntg, and admission. He did look at the ecg and labs. Does not feel this requires emergent cath. He does not feel that aggressive bp reduction is warranted given that she is at her baseline and asymptomatic. If she has pain, he feels that hydralazine or labetolol will be good choices.  D/w hospitalist who agree w/ plan and will admit.   ____________________________________________   FINAL CLINICAL IMPRESSION(S) / ED DIAGNOSES  Final diagnoses:  None      This chart was dictated using voice recognition software.  Despite best efforts to proofread,  errors can occur which can change meaning.     Schuyler Amor, MD 06/27/16 1622  Schuyler Amor, MD 06/27/16 [id number]

## 2016-06-27 NOTE — Assessment & Plan Note (Signed)
Etiology unclear. Exam benign. She has had an extensive cardiac work up- neg, all recently. And now followed by local and [facility] cardiologists. ? MSK but pain is very severe and history not consistent. Will check stat D dimer, troponin, CXR.  The patient indicates understanding of these issues and agrees with the plan. Orders Placed This Encounter  Procedures  . DG Chest 2 View  . D-dimer, quantitative (not at [facility])  . Troponin I  . Comprehensive metabolic panel

## 2016-06-27 NOTE — Addendum Note (Signed)
Addended by: Daralene Milch C on: 06/27/2016 01:53 PM   Modules accepted: Miquel Dunn

## 2016-06-27 NOTE — Progress Notes (Signed)
Pre visit review using our clinic review tool, if applicable. No additional management support is needed unless otherwise documented below in the visit note. 

## 2016-06-27 NOTE — H&P (Signed)
[place] [place] NAME: Erika [name]    MR#:  [id number]  DATE OF BIRTH:  March 20, 1971   DATE OF ADMISSION:  06/27/2016  PRIMARY CARE PHYSICIAN: Arnette Norris, MD   REQUESTING/REFERRING PHYSICIAN: McShane  CHIEF COMPLAINT:   Chief Complaint  Patient presents with  . Abnormal Lab  Chest pain  HISTORY OF PRESENT ILLNESS:  Erika [name]  is a 45 y.o. female with a known history of Poorly controlled hypertension who is presenting after the urgency of her PCP. She has been complaining of intermittent pain chest pain, back pain, radiation to left shoulder and left neck. Describes pain only has "pain" 8/10 intensity no worsening or relieving factors. Does not appear to be related with exertion. Has associated diaphoresis and shortness of breath. Went to see PCP found to have elevated troponin thus than to the Hospital further workup and evaluation.     PAST MEDICAL HISTORY:   Past Medical History  Diagnosis Date  . Essential hypertension, benign   . Other and unspecified hyperlipidemia   . Unspecified essential hypertension   . Arthralgia of temporomandibular joint   . PTSD (post-traumatic stress disorder)   . Depression   . Fatty liver disease, nonalcoholic Q000111Q  . Type 2 diabetes mellitus [facility]) January 2017    PAST SURGICAL HISTORY:   Past Surgical History  Procedure Laterality Date  . Cholecystectomy    . Tonsillectomy    . Cesarean section    . Peripheral vascular catheterization [name] 04/18/2016    Procedure: Renal Angiography;  Surgeon: Wellington Hampshire, MD;  Location: [facility];  Service: Cardiovascular;  Laterality: [name];    SOCIAL HISTORY:   Social History  Substance Use Topics  . Smoking status: Current Every Day Smoker -- 0.50 packs/day    Types: Cigarettes  . Smokeless tobacco: Never Used  . Alcohol Use: No    FAMILY HISTORY:   Family History  Problem Relation Age of Onset  . Adopted: Yes  .  Diabetes Mother   . Diabetes Father   . Alcohol abuse Father   . Heart disease Father   . Drug abuse Father   . Stroke Sister   . Anxiety disorder Sister     DRUG ALLERGIES:  No Known Allergies  REVIEW OF SYSTEMS:  REVIEW OF SYSTEMS:  CONSTITUTIONAL: Denies fevers, chills, fatigue, weakness.  EYES: Denies blurred vision, double vision, or eye pain.  EARS, NOSE, THROAT: Denies tinnitus, ear pain, hearing loss.  RESPIRATORY: denies cough, shortness of breath, wheezing  CARDIOVASCULAR:Positive chest pain,denies  palpitations, edema.  GASTROINTESTINAL: Denies nausea, vomiting, diarrhea, abdominal pain.  GENITOURINARY: Denies dysuria, hematuria.  ENDOCRINE: Denies nocturia or thyroid problems. HEMATOLOGIC AND LYMPHATIC: Denies easy bruising or bleeding.  SKIN: Denies rash or lesions.  MUSCULOSKELETAL: Denies pain in neck, back, shoulder, knees, hips, or further arthritic symptoms.  NEUROLOGIC: Denies paralysis, paresthesias.  PSYCHIATRIC: Denies anxiety or depressive symptoms. Otherwise full review of systems performed by me is negative.   MEDICATIONS AT HOME:   Prior to Admission medications   Medication Sig Start Date End Date Taking? Authorizing Provider  aspirin EC 81 MG tablet Take 1 tablet (81 mg total) by mouth daily. 04/06/16   Wellington Hampshire, MD  carvedilol (COREG) 25 MG tablet Take 1 tablet (25 mg total) by mouth 2 (two) times daily. 02/24/16   Wellington Hampshire, MD  clonazePAM (KLONOPIN) 0.5 MG tablet Take 1 tablet (0.5 mg total) by mouth 3 (  three) times daily. 06/19/16 06/19/17  Kathlee Nations, MD  enalapril (VASOTEC) 20 MG tablet Take 1 tablet (20 mg total) by mouth daily. 01/12/16   Lucille Passy, MD  FLUoxetine (PROZAC) 40 MG capsule Take 1 capsule (40 mg total) by mouth daily. 06/19/16   Kathlee Nations, MD  hydrochlorothiazide (HYDRODIURIL) 25 MG tablet TAKE 1 pill daily Patient taking differently: Take 25 mg by mouth daily. TAKE 1 pill daily 01/12/16   Lucille Passy, MD    HYDROcodone-acetaminophen (NORCO) 5-325 MG tablet Take 1 tablet by mouth every 6 (six) hours as needed for moderate pain. 06/27/16   Lucille Passy, MD  lamoTRIgine (LAMICTAL) 200 MG tablet Take 1 tablet (200 mg total) by mouth daily. 06/19/16   Kathlee Nations, MD  metFORMIN (GLUCOPHAGE) 500 MG tablet Take 1 tablet (500 mg total) by mouth daily with breakfast. Patient taking differently: Take 500 mg by mouth 2 (two) times daily with a meal.  12/08/15   Lucille Passy, MD  simvastatin (ZOCOR) 20 MG tablet TAKE 1 TABLET (20 MG TOTAL) BY MOUTH EVERY EVENING. Patient taking differently: Take 20 mg by mouth daily. TAKE 1 TABLET (20 MG TOTAL) BY MOUTH EVERY EVENING. 04/25/15   Lucille Passy, MD  spironolactone (ALDACTONE) 25 MG tablet Take 1 tablet (25 mg total) by mouth daily. 05/14/16   Wellington Hampshire, MD  verapamil (CALAN) 120 MG tablet Take 1 tablet (120 mg total) by mouth 3 (three) times daily. 01/12/16   Lucille Passy, MD      VITAL SIGNS:  Blood pressure 166/106, pulse 86, temperature 98.2 F (36.8 C), temperature source Oral, resp. rate 19, height 5\' 7"  (1.702 m), weight 215 lb (97.523 kg), last menstrual period 06/21/2016, SpO2 95 %.  PHYSICAL EXAMINATION:  VITAL SIGNS: Filed Vitals:   06/27/16 1556 06/27/16 1630  BP: 205/120 166/106  Pulse: 102 86  Temp: 98.2 F (36.8 C)   Resp: 66 19   GENERAL:44 y.o.female currently in no acute distress.  HEAD: Normocephalic, atraumatic.  EYES: Pupils equal, round, reactive to light. Extraocular muscles intact. No scleral icterus.  MOUTH: Moist mucosal membrane. Dentition intact. No abscess noted.  EAR, NOSE, THROAT: Clear without exudates. No external lesions.  NECK: Supple. No thyromegaly. No nodules. No JVD.  PULMONARY: Clear to ascultation, without wheeze rails or rhonci. No use of accessory muscles, Good respiratory effort. good air entry bilaterally CHEST: Nontender to palpation.  CARDIOVASCULAR: S1 and S2. Regular rate and rhythm. No murmurs,  rubs, or gallops. No edema. Pedal pulses 2+ bilaterally.  GASTROINTESTINAL: Soft, nontender, nondistended. No masses. Positive bowel sounds. No hepatosplenomegaly.  MUSCULOSKELETAL: No swelling, clubbing, or edema. Range of motion full in all extremities.  NEUROLOGIC: Cranial nerves II through XII are intact. No gross focal neurological deficits. Sensation intact. Reflexes intact.  SKIN: No ulceration, lesions, rashes, or cyanosis. Skin warm and dry. Turgor intact.  PSYCHIATRIC: Mood, affect within normal limits. The patient is awake, alert and oriented x 3. Insight, judgment intact.    LABORATORY PANEL:   CBC  Recent Labs Lab 06/27/16 1607  WBC 15.5*  HGB 15.6  HCT 44.4  PLT 424   ------------------------------------------------------------------------------------------------------------------  Chemistries   Recent Labs Lab 06/27/16 1201 06/27/16 1607  NA 134* 137  K 3.7 3.8  CL 98 100*  CO2 30 27  GLUCOSE 192* 208*  BUN 6 7  CREATININE 0.74 0.76  CALCIUM 10.5 10.4*  AST 32  --   ALT 39*  --  ALKPHOS 76  --   BILITOT 0.3  --    ------------------------------------------------------------------------------------------------------------------  Cardiac Enzymes  Recent Labs Lab 06/27/16 1607  TROPONINI 0.81*   ------------------------------------------------------------------------------------------------------------------  RADIOLOGY:  Dg Chest 2 View  06/27/2016  CLINICAL DATA:  Severe back/chest pain EXAM: CHEST  2 VIEW COMPARISON:  11/30/2015 FINDINGS: Lungs are clear.  No pleural effusion or pneumothorax. The heart is normal in size. Visualized osseous structures are within normal limits. IMPRESSION: Normal chest radiographs. Electronically Signed   By: Julian Hy M.D.   On: 06/27/2016 12:27    EKG:   Orders placed or performed during the hospital encounter of 06/27/16  . EKG 12-Lead  . EKG 12-Lead  . ED EKG within 10 minutes  . ED EKG within 10  minutes    IMPRESSION AND PLAN:   45 year old Caucasian female history of diabetes poorly controlled hypertension presenting with episodic chest pain found to have elevated troponin  1. NSTEMI: Aspirin and statin,cardiology consult, telemetry, Lovenox, echocardiogram continue beta blocker trend cardiac enzymes 2. Poorly controlled hypertension/hypertensive urgency: Patient's baseline blood pressure is apparently 190/110 continue home medications and use hydralazine as required 3. Type 2 diabetes non-insulin-requiring: Hold oral agents at insulin sliding scale 4. Hyperlipidemia unspecified statin therapy 5. Tobacco abuse: Time spent counseling 3 minutes   All the records are reviewed and case discussed with ED provider. Management plans discussed with the patient, family and they are in agreement.  CODE STATUS: Full  TOTAL TIME TAKING CARE OF THIS PATIENT: 36  minutes.    Terrie Haring,  Karenann Cai.D on 06/27/2016 at 5:02 PM  Between 7am to 6pm - Pager - [phone]  After 6pm: House Pager: - [phone]  [facility]  Office  [phone]  CC: Primary care physician; Arnette Norris, MD

## 2016-06-27 NOTE — Telephone Encounter (Signed)
Elevated Troponin 0.94. Hardcopy of results given to Dr. Deborra Medina.

## 2016-06-27 NOTE — ED Notes (Signed)
States she was sent by PCP for trop of 0.94, states back, neck and shoulder pain since Fathers day, hx of HTN and DM

## 2016-06-27 NOTE — Progress Notes (Signed)
Subjective:   Patient ID: Erika [name], female    DOB: 04-09-1971, 45 y.o.   MRN: [id number]  Erika [name] is a pleasant 45 y.o. year old female who presents to clinic today with Back Pain; Shoulder Pain; and Vomiting  on 06/27/2016  HPI:  Refractory HTN- now enrolled in a study at [facility]  For a month, intermittent pain lateral to left shoulder blade and CP. Just saw cardiologist at [facility] last week.  Also had a negative NM myocardial perfusion study on 04/13/16.  When she has these episodes of back/CP, so severe that it makes her vomit.  Pain radiates down both of her arms when it happens and she can feel short of breath.  No pain with movement.  Nothing seems to make it better or worse.  It just "goes away."  Current Outpatient Prescriptions on File Prior to Visit  Medication Sig Dispense Refill  . aspirin EC 81 MG tablet Take 1 tablet (81 mg total) by mouth daily. 90 tablet 3  . carvedilol (COREG) 25 MG tablet Take 1 tablet (25 mg total) by mouth 2 (two) times daily. 60 tablet 3  . clonazePAM (KLONOPIN) 0.5 MG tablet Take 1 tablet (0.5 mg total) by mouth 3 (three) times daily. 90 tablet 2  . enalapril (VASOTEC) 20 MG tablet Take 1 tablet (20 mg total) by mouth daily. 30 tablet 5  . FLUoxetine (PROZAC) 40 MG capsule Take 1 capsule (40 mg total) by mouth daily. 30 capsule 2  . hydrochlorothiazide (HYDRODIURIL) 25 MG tablet TAKE 1 pill daily (Patient taking differently: Take 25 mg by mouth daily. TAKE 1 pill daily) 30 tablet 2  . HYDROcodone-acetaminophen (NORCO) 5-325 MG tablet Take 1 tablet by mouth every 6 (six) hours as needed for moderate pain. 90 tablet 0  . lamoTRIgine (LAMICTAL) 200 MG tablet Take 1 tablet (200 mg total) by mouth daily. 30 tablet 2  . metFORMIN (GLUCOPHAGE) 500 MG tablet Take 1 tablet (500 mg total) by mouth daily with breakfast. (Patient taking differently: Take 500 mg by mouth 2 (two) times daily with a meal. ) 30 tablet 3  . simvastatin (ZOCOR) 20  MG tablet TAKE 1 TABLET (20 MG TOTAL) BY MOUTH EVERY EVENING. (Patient taking differently: Take 20 mg by mouth daily. TAKE 1 TABLET (20 MG TOTAL) BY MOUTH EVERY EVENING.) 30 tablet 5  . spironolactone (ALDACTONE) 25 MG tablet Take 1 tablet (25 mg total) by mouth daily. 30 tablet 3  . verapamil (CALAN) 120 MG tablet Take 1 tablet (120 mg total) by mouth 3 (three) times daily. 90 tablet 3   No current facility-administered medications on file prior to visit.    No Known Allergies  Past Medical History  Diagnosis Date  . Essential hypertension, benign   . Other and unspecified hyperlipidemia   . Unspecified essential hypertension   . Arthralgia of temporomandibular joint   . PTSD (post-traumatic stress disorder)   . Depression   . Fatty liver disease, nonalcoholic Q000111Q  . Type 2 diabetes mellitus [facility]) January 2017    Past Surgical History  Procedure Laterality Date  . Cholecystectomy    . Tonsillectomy    . Cesarean section    . Peripheral vascular catheterization [name] 04/18/2016    Procedure: Renal Angiography;  Surgeon: Wellington Hampshire, MD;  Location: [facility];  Service: Cardiovascular;  Laterality: [name];    Family History  Problem Relation Age of Onset  . Adopted: Yes  . Diabetes Mother   .  Diabetes Father   . Alcohol abuse Father   . Heart disease Father   . Drug abuse Father   . Stroke Sister   . Anxiety disorder Sister     Social History   Social History  . Marital Status: Married    Spouse Name: [name]  . Number of Children: [name]  . Years of Education: [name]   Occupational History  . Not on file.   Social History Main Topics  . Smoking status: Current Every Day Smoker -- 0.50 packs/day    Types: Cigarettes  . Smokeless tobacco: Never Used  . Alcohol Use: No  . Drug Use: No  . Sexual Activity: Yes    Birth Control/ Protection: None   Other Topics Concern  . Not on file   Social History Narrative   The PMH, PSH, Social History, Family History,  Medications, and allergies have been reviewed in [facility], and have been updated if relevant.   Review of Systems  Constitutional: Negative.   HENT: Negative.   Eyes: Negative.   Respiratory: Negative.   Cardiovascular: Positive for chest pain. Negative for palpitations and leg swelling.  Gastrointestinal: Positive for nausea and vomiting.  Endocrine: Negative.   Musculoskeletal: Positive for back pain.  Neurological: Negative.   Hematological: Negative.   Psychiatric/Behavioral: Negative.   All other systems reviewed and are negative.      Objective:    BP 190/110 mmHg  Pulse 97  Temp(Src) 98.3 F (36.8 C) (Oral)  Wt 215 lb (97.523 kg)  SpO2 96%  LMP 06/21/2016  BP Readings from Last 3 Encounters:  06/27/16 190/110  06/19/16 180/110  05/14/16 200/120    Physical Exam  Constitutional: She is oriented to person, place, and time. She appears well-developed and well-nourished. No distress.  HENT:  Head: Normocephalic.  Eyes: Conjunctivae are normal.  Cardiovascular: Normal rate.   Pulmonary/Chest: Effort normal.  Musculoskeletal: Normal range of motion.  Neurological: She is alert and oriented to person, place, and time. No cranial nerve deficit.  Skin: Skin is warm and dry. She is not diaphoretic.  Psychiatric: She has a normal mood and affect. Her behavior is normal. Judgment and thought content normal.  Nursing note and vitals reviewed.         Assessment & Plan:   Chest pain, unspecified chest pain type - Plan: DG Chest 2 View, D-dimer, quantitative (not at [facility]), Troponin I, Comprehensive metabolic panel  HTN (hypertension), malignant No Follow-up on file.

## 2016-06-27 NOTE — Progress Notes (Addendum)
Pt. admitted to unit, [id number] from ED, report from [place], [name]. Oriented to room, call bell, Ascom phones and staff. Bed in low position. Fall safety plan reviewed, non-skid socks in place. Full assessment to Epic; skin assessed with Carlyle Dolly., RN. Telemetry box verified with CCMD and Carlyle Dolly., RN: [phone] . Will continue to monitor.   Pt reports smoking 1PPD, asking for nicotene patch. MD text paged and orders placed.

## 2016-06-28 ENCOUNTER — Inpatient Hospital Stay: Payer: BLUE CROSS/BLUE SHIELD

## 2016-06-28 ENCOUNTER — Encounter: Payer: Self-pay | Admitting: Physician Assistant

## 2016-06-28 DIAGNOSIS — F17201 Nicotine dependence, unspecified, in remission: Secondary | ICD-10-CM | POA: Diagnosis present

## 2016-06-28 DIAGNOSIS — I10 Essential (primary) hypertension: Secondary | ICD-10-CM | POA: Insufficient documentation

## 2016-06-28 DIAGNOSIS — Z720 Tobacco use: Secondary | ICD-10-CM

## 2016-06-28 DIAGNOSIS — I214 Non-ST elevation (NSTEMI) myocardial infarction: Principal | ICD-10-CM

## 2016-06-28 DIAGNOSIS — E0859 Diabetes mellitus due to underlying condition with other circulatory complications: Secondary | ICD-10-CM | POA: Insufficient documentation

## 2016-06-28 DIAGNOSIS — R778 Other specified abnormalities of plasma proteins: Secondary | ICD-10-CM | POA: Diagnosis present

## 2016-06-28 DIAGNOSIS — R7989 Other specified abnormal findings of blood chemistry: Secondary | ICD-10-CM | POA: Diagnosis present

## 2016-06-28 DIAGNOSIS — E118 Type 2 diabetes mellitus with unspecified complications: Secondary | ICD-10-CM

## 2016-06-28 DIAGNOSIS — E1169 Type 2 diabetes mellitus with other specified complication: Secondary | ICD-10-CM | POA: Insufficient documentation

## 2016-06-28 DIAGNOSIS — E785 Hyperlipidemia, unspecified: Secondary | ICD-10-CM

## 2016-06-28 DIAGNOSIS — E1165 Type 2 diabetes mellitus with hyperglycemia: Secondary | ICD-10-CM

## 2016-06-28 DIAGNOSIS — E119 Type 2 diabetes mellitus without complications: Secondary | ICD-10-CM

## 2016-06-28 HISTORY — DX: Nicotine dependence, unspecified, in remission: F17.201

## 2016-06-28 HISTORY — DX: Essential (primary) hypertension: I10

## 2016-06-28 LAB — TROPONIN I
Troponin I: 0.83 ng/mL (ref <0.03)
Troponin I: 0.82 ng/mL (ref <0.03)

## 2016-06-28 LAB — LIPID PANEL
CHOL/HDL RATIO: 9.5 ratio
CHOLESTEROL: 218 mg/dL — AB (ref 0–200)
HDL: 23 mg/dL — AB (ref >40)
LDL Cholesterol: UNDETERMINED mg/dL (ref 0–99)
Triglycerides: 436 mg/dL — ABNORMAL HIGH (ref <150)
VLDL: UNDETERMINED mg/dL (ref 0–40)

## 2016-06-28 LAB — ECHOCARDIOGRAM COMPLETE
E decel time: 225 msec
EERAT: 18.06
FS: 35 % (ref 28–44)
Height: 67 in
IVS/LV PW RATIO, ED: 0.99
LA ID, A-P, ES: 38 mm
LA diam end sys: 38 mm
LA diam index: 1.84 cm/m2
LA vol A4C: 32 ml
LA vol index: 17.2 mL/m2
LA vol: 35.6 mL
LV E/e'average: 18.06
LV TDI E'LATERAL: 5
LVEEMED: 18.06
LVELAT: 5 cm/s
LVOT SV: 55 mL
LVOT VTI: 19.2 cm
LVOT area: 2.84 cm2
LVOT diameter: 19 mm
LVOTPV: 102 cm/s
MV Dec: 225
MV pk A vel: 99.5 m/s
MV pk E vel: 90.3 m/s
MVPG: 3 mmHg
PW: 12 mm — AB (ref 0.6–1.1)
RV TAPSE: 23.1 mm
TDI e' medial: 6.74
Weight: 3392 oz

## 2016-06-28 LAB — BASIC METABOLIC PANEL
ANION GAP: 7 (ref 5–15)
BUN: 9 mg/dL (ref 6–20)
CALCIUM: 9.6 mg/dL (ref 8.9–10.3)
CO2: 27 mmol/L (ref 22–32)
CREATININE: 0.87 mg/dL (ref 0.44–1.00)
Chloride: 101 mmol/L (ref 101–111)
GFR calc Af Amer: >60 mL/min (ref >60)
GLUCOSE: 166 mg/dL — AB (ref 65–99)
Potassium: 4.2 mmol/L (ref 3.5–5.1)
Sodium: 135 mmol/L (ref 135–145)

## 2016-06-28 LAB — HEMOGLOBIN A1C: HEMOGLOBIN A1C: 10.4 % — AB (ref 4.0–6.0)

## 2016-06-28 LAB — PREGNANCY, URINE: Preg Test, Ur: NEGATIVE

## 2016-06-28 MED ORDER — IOPAMIDOL (ISOVUE-370) INJECTION 76%
100.0000 mL | Freq: Once | INTRAVENOUS | Status: AC | PRN
  Administered 2016-06-28: 100 mL via INTRAVENOUS

## 2016-06-28 MED ORDER — ATORVASTATIN CALCIUM 20 MG PO TABS
80.0000 mg | ORAL_TABLET | Freq: Every day | ORAL | Status: DC
  Administered 2016-06-28: 80 mg via ORAL
  Filled 2016-06-28: qty 4

## 2016-06-28 MED ORDER — NITROGLYCERIN 2 % TD OINT: 1.0000 [in_us] | TOPICAL_OINTMENT | Freq: Four times a day (QID) | TRANSDERMAL | Status: DC | PRN

## 2016-06-28 MED ORDER — NITROGLYCERIN 0.4 MG SL SUBL: 0.4000 mg | SUBLINGUAL_TABLET | SUBLINGUAL | Status: DC | PRN

## 2016-06-28 NOTE — Care Management (Addendum)
Chart reviewed for potential discharge needs. Extensive HTN history and family history of HTN with aortic dissection. Plan is CTA to r/o aortic dissection then cardiac cath on 07/08. Troponin peaked at 1.04. It is noted that patient has been compliant with her medications. She lives at home with her spouse. PCP is Dr. Deborra Medina. No discharge needs anticipated. Provided patient with RNCM contact information if needs arise.

## 2016-06-28 NOTE — Consult Note (Signed)
Cardiology Consultation Note  Patient ID: Erika [name], MRN: [id number], DOB/AGE: Oct 08, 1971 45 y.o. Admit date: 06/27/2016   Date of Consult: 06/28/2016 Primary Physician: Arnette Norris, MD Primary Cardiologist: New to [facility] Requesting Physician: Dr. Lavetta Nielsen, MD  Chief Complaint: Back pain Reason for Consult: Elevated troponin   HPI: 45 y.o. female with h/o refractory hypertension, chest pain, HLD, anxiety, and depression who presented to the [facility] ED on 7/5 at the request of her PCP 2/2 elevated troponin checked in the outpatient setting in the setting of malignant hypertension. She has documented HTN for at least 18 years. Her blood pressure once again became uncontrolled in 2016. She was hospitalized for hypotension and bradycardia with a borderline lactic acid. She followed up with her PCP for pheochromocytoma workup which was negative. Her workup for secondary HTN has been negative as well with a normal aldosterone to renin ratio. Her renal artery duplex was suggestive of significant right renal artery stenosis. However, she recently underwent angiography which showed minimal disease involving the right renal artery and a normal left renal artery. She is followed by Dr. Holley Raring, MD for mild proteinuria with a negative workup. Patient has recently had a negative Myoview on 04/13/2016. Most recent echo from 03/12/2016 showed an EF of 60-65%, normal wall motion, GR1DD. At her last cardiology visit with [facility] her BP was noted to be 200/120. She reported compliance with her medications. She has been enrolled in a study at [facility] for refractory HTN, no notes are on file in [place]. She was seeing her PCP today and noted episodes of back and left axilla pain that were associated with diaphoresis, emesis and SOB. Her pain radiated down her bilateral arms. She had a stat D-dimer, troponin, and CXR done by PCP as an outpatient. Her troponin came back at 0.94 Her D-dimer was negative a 0.42. CXR was normal. BP  at PCP office was in the 99991111 systolic. She was advised to go to the [facility] ED.  She reports an intermittent history of midline back pain since Father's Day. No chest pain. Pain is not associated with exertion. Pain will last anywhere from 15 minutes to 6 hours and will resolve on its own. She has associated nausea, vomiting, and diaphoresis. Her pain will radiate to the left side of her neck and down her bilateral arms, left greater than right. She is currently without pain.   Patient's grandmother developed significant HTN in her 91's as well. Sadly, she passed from an aneurysm (patient does not know location) around the age the patient currently is.   Upon her arrival to [facility] ED her initial ED troponin was 0.81-->1.04-->0.83-->0.82. No CTA aorta performed to evaluate for dissection. CXR normal. EKG showed NSR, 94 bpm, previous inferior infarct, early repolarization, down sloping st depression along inferolateral leads. WBC was 15.5. SCr 0.76. K+ 3.8-->4.2. BP 205/120. She was started on a nitro patch. Her BP remained in the 123456 to 99991111 systolic. Her last recorded BP at 4:13 AM was 123456 systolic.   Past Medical History  Diagnosis Date  . Malignant hypertension   . HLD (hyperlipidemia)   . Arthralgia of temporomandibular joint   . PTSD (post-traumatic stress disorder)   . Depression   . Fatty liver disease, nonalcoholic [id number]  . Type 2 diabetes mellitus [facility]) January 2017  . Obesity   . Tobacco abuse       Most Recent Cardiac Studies: Echo 03/12/2016: Study Conclusions - Left ventricle: The cavity size was normal. There was  mild  concentric hypertrophy. Systolic function was normal. The  estimated ejection fraction was in the range of 60% to 65%. Wall  motion was normal; there were no regional wall motion  abnormalities. Doppler parameters are consistent with abnormal  left ventricular relaxation (grade 1 diastolic dysfunction).  Nuclear stress test 04/13/2016:  There was no ST  segment deviation noted during stress.  The study is normal.  This is a low risk study.  The left ventricular ejection fraction is normal (55-65%).   Surgical History:  Past Surgical History  Procedure Laterality Date  . Cholecystectomy    . Tonsillectomy    . Cesarean section    . Peripheral vascular catheterization [name] 04/18/2016    Procedure: Renal Angiography;  Surgeon: Wellington Hampshire, MD;  Location: [facility];  Service: Cardiovascular;  Laterality: [name];     Home Meds: Prior to Admission medications   Medication Sig Start Date End Date Taking? Authorizing Provider  aspirin EC 81 MG tablet Take 1 tablet (81 mg total) by mouth daily. 04/06/16   Wellington Hampshire, MD  carvedilol (COREG) 25 MG tablet Take 1 tablet (25 mg total) by mouth 2 (two) times daily. 02/24/16   Wellington Hampshire, MD  clonazePAM (KLONOPIN) 0.5 MG tablet Take 1 tablet (0.5 mg total) by mouth 3 (three) times daily. 06/19/16 06/19/17  Kathlee Nations, MD  enalapril (VASOTEC) 20 MG tablet Take 1 tablet (20 mg total) by mouth daily. 01/12/16   Lucille Passy, MD  FLUoxetine (PROZAC) 40 MG capsule Take 1 capsule (40 mg total) by mouth daily. 06/19/16   Kathlee Nations, MD  hydrochlorothiazide (HYDRODIURIL) 25 MG tablet TAKE 1 pill daily Patient taking differently: Take 25 mg by mouth daily. TAKE 1 pill daily 01/12/16   Lucille Passy, MD  HYDROcodone-acetaminophen (NORCO) 5-325 MG tablet Take 1 tablet by mouth every 6 (six) hours as needed for moderate pain. 06/27/16   Lucille Passy, MD  lamoTRIgine (LAMICTAL) 200 MG tablet Take 1 tablet (200 mg total) by mouth daily. 06/19/16   Kathlee Nations, MD  metFORMIN (GLUCOPHAGE) 500 MG tablet Take 1 tablet (500 mg total) by mouth daily with breakfast. Patient taking differently: Take 500 mg by mouth 2 (two) times daily with a meal.  12/08/15   Lucille Passy, MD  simvastatin (ZOCOR) 20 MG tablet TAKE 1 TABLET (20 MG TOTAL) BY MOUTH EVERY EVENING. Patient taking differently: Take 20 mg by  mouth daily. TAKE 1 TABLET (20 MG TOTAL) BY MOUTH EVERY EVENING. 04/25/15   Lucille Passy, MD  spironolactone (ALDACTONE) 25 MG tablet Take 1 tablet (25 mg total) by mouth daily. 05/14/16   Wellington Hampshire, MD  verapamil (CALAN) 120 MG tablet Take 1 tablet (120 mg total) by mouth 3 (three) times daily. 01/12/16   Lucille Passy, MD    Inpatient Medications:  . aspirin EC  81 mg Oral Daily  . carvedilol  25 mg Oral BID  . clonazePAM  0.5 mg Oral TID  . enalapril  20 mg Oral Daily  . enoxaparin (LOVENOX) injection  1 mg/kg Subcutaneous Q12H  . FLUoxetine  40 mg Oral Daily  . lamoTRIgine  200 mg Oral Daily  . nicotine  21 mg Transdermal Daily  . simvastatin  20 mg Oral Daily  . sodium chloride flush  3 mL Intravenous Q12H  . spironolactone  25 mg Oral Daily  . verapamil  120 mg Oral TID  Allergies: No Known Allergies  Social History   Social History  . Marital Status: Married    Spouse Name: [name]  . Number of Children: [name]  . Years of Education: [name]   Occupational History  . Not on file.   Social History Main Topics  . Smoking status: Current Every Day Smoker -- 0.50 packs/day    Types: Cigarettes  . Smokeless tobacco: Never Used  . Alcohol Use: No  . Drug Use: No  . Sexual Activity: Yes    Birth Control/ Protection: None   Other Topics Concern  . Not on file   Social History Narrative     Family History  Problem Relation Age of Onset  . Adopted: Yes  . Diabetes Mother   . Diabetes Father   . Alcohol abuse Father   . Heart disease Father   . Drug abuse Father   . Stroke Sister   . Anxiety disorder Sister      Review of Systems: Review of Systems  Constitutional: Positive for malaise/fatigue. Negative for fever, chills, weight loss and diaphoresis.  HENT: Negative for congestion.   Eyes: Negative for discharge and redness.  Respiratory: Negative for cough, hemoptysis, sputum production, shortness of breath and wheezing.   Cardiovascular: Negative for chest  pain, palpitations, orthopnea, claudication, leg swelling and PND.  Gastrointestinal: Positive for nausea and vomiting. Negative for heartburn and abdominal pain.  Musculoskeletal: Positive for back pain and joint pain. Negative for myalgias and falls.  Skin: Negative for rash.  Neurological: Negative for dizziness, tingling, tremors, sensory change, speech change, focal weakness, loss of consciousness, weakness and headaches.  Endo/Heme/Allergies: Does not bruise/bleed easily.  Psychiatric/Behavioral: Negative for substance abuse. The patient is not nervous/anxious.   All other systems reviewed and are negative.   Labs:  Recent Labs  06/27/16 1607 06/27/16 1942 06/27/16 2350 06/28/16 0544  TROPONINI 0.81* 1.04* 0.83* 0.82*   Lab Results  Component Value Date   WBC 15.5* 06/27/2016   HGB 15.6 06/27/2016   HCT 44.4 06/27/2016   MCV 86.2 06/27/2016   PLT 424 06/27/2016    Recent Labs Lab 06/27/16 1201  06/28/16 0544  NA 134*  < > 135  K 3.7  < > 4.2  CL 98  < > 101  CO2 30  < > 27  BUN 6  < > 9  CREATININE 0.74  < > 0.87  CALCIUM 10.5  < > 9.6  PROT 7.7  --   --   BILITOT 0.3  --   --   ALKPHOS 76  --   --   ALT 39*  --   --   AST 32  --   --   GLUCOSE 192*  < > 166*  < > = values in this interval not displayed. Lab Results  Component Value Date   CHOL 252* 04/22/2012   HDL 27.80* 04/22/2012   TRIG 395.0* 04/22/2012   Lab Results  Component Value Date   DDIMER 0.42 06/27/2016    Radiology/Studies:  Dg Chest 2 View  06/27/2016  CLINICAL DATA:  Severe back/chest pain EXAM: CHEST  2 VIEW COMPARISON:  11/30/2015 FINDINGS: Lungs are clear.  No pleural effusion or pneumothorax. The heart is normal in size. Visualized osseous structures are within normal limits. IMPRESSION: Normal chest radiographs. Electronically Signed   By: Julian Hy M.D.   On: 06/27/2016 12:27    EKG: Interpreted by me showed: NSR, 94 bpm, previous inferior infarct, early  repolarization, down sloping  st depression along inferolateral leads Telemetry: Interpreted by me showed: NSR, 60's bpm  Weights: Filed Weights   06/27/16 1556 06/27/16 1737  Weight: 215 lb (97.523 kg) 212 lb (96.163 kg)     Physical Exam: Blood pressure 120/73, pulse 72, temperature 97.5 F (36.4 C), temperature source Oral, resp. rate 16, height 5\' 7"  (1.702 m), weight 212 lb (96.163 kg), last menstrual period 06/21/2016, SpO2 91 %. Body mass index is 33.2 kg/(m^2). General: Well developed, well nourished, in no acute distress. Head: Normocephalic, atraumatic, sclera non-icteric, no xanthomas, nares are without discharge.  Neck: Negative for carotid bruits. JVD not elevated. Lungs: Clear bilaterally to auscultation without wheezes, rales, or rhonchi. Breathing is unlabored. Heart: RRR with S1 S2. No murmurs, rubs, or gallops appreciated. TTP along left axilla though this is not the same pain she has been experiencing.  Abdomen: Obese. soft, non-tender, non-distended with normoactive bowel sounds. No hepatomegaly. No rebound/guarding. No obvious abdominal masses. Msk:  Strength and tone appear normal for age. Extremities: No clubbing or cyanosis. No edema. Distal pedal pulses are 2+ and equal bilaterally. Neuro: Alert and oriented X 3. No facial asymmetry. No focal deficit. Moves all extremities spontaneously. Psych:  Responds to questions appropriately with a normal affect.    Assessment and Plan:  Principal Problem:   HTN (hypertension), malignant Active Problems:   Elevated troponin   Tobacco abuse   HLD (hyperlipidemia)    1. Elevated troponin: -Unable to determine at this time if this is related to ACS or demand ischemia in the setting of the patient's malignant hypertension -Patient has never had chest pain, only mid-line back pain and left axilla pain -She recently had a negative nuclear study in April 2017 -She is at high risk of aortic dissection given her long term,  profound malignant hypertension -Plan for CTA aorta at this time to evaluate for aortic dissection  -If CTA is negative plan for cardiac cath on 7/8 after contrast washout with IV hydration this evening -On Lovenox, as long as she appears hemodynamically stable continue -She prefers CT chest initially, followed by cardiac cath  2. Malignant hypertension: -Most recent BP at 4:13 AM was 123456 systolic. Patient reports she never has a BP this low -Will recheck BP -Continue current medications at this time -If BP has trended back up titrate antihypertensives in an effort to lower BP -She appears hemodynamically stable at this time -At high risk of aortic dissection as above  3. Tobacco abuse: -Cessation advised  4. HLD: -Change simvastatin to Lipitor  5. Obesity: -Weight loss advised    Signed, Christell Faith, PA-C [facility] Pager: [phone] 06/28/2016, 7:57 AM

## 2016-06-28 NOTE — Progress Notes (Signed)
Patient c/o back pain radiating to chest. Pain rated 6/10. Telemetry reading nsr  85. 2mg  iv morphine given will continue to monitor closely

## 2016-06-28 NOTE — Progress Notes (Signed)
[place] at [place] NAME: Erika [name]    MR#:  [id number] DATE OF BIRTH:  1971-10-23  SUBJECTIVE:   Doing ok this am  No chest pain no SOB Blood pressure this morning is actually controlled.  REVIEW OF SYSTEMS:    Review of Systems  Constitutional: Negative for fever, chills and malaise/fatigue.  HENT: Negative for ear discharge, ear pain, hearing loss, nosebleeds and sore throat.   Eyes: Negative for blurred vision and pain.  Respiratory: Negative for cough, hemoptysis, shortness of breath and wheezing.   Cardiovascular: Negative for chest pain, palpitations and leg swelling.  Gastrointestinal: Negative for nausea, vomiting, abdominal pain, diarrhea and blood in stool.  Genitourinary: Negative for dysuria.  Musculoskeletal: Negative for back pain.  Neurological: Negative for dizziness, tremors, speech change, focal weakness, seizures and headaches.  Endo/Heme/Allergies: Does not bruise/bleed easily.  Psychiatric/Behavioral: Negative for depression, suicidal ideas and hallucinations.    Tolerating Diet:yes      DRUG ALLERGIES:  No Known Allergies  VITALS:  Blood pressure 142/74, pulse 92, temperature 97.7 F (36.5 C), temperature source Oral, resp. rate 16, height 5\' 7"  (1.702 m), weight 96.163 kg (212 lb), last menstrual period 06/21/2016, SpO2 91 %.  PHYSICAL EXAMINATION:   Physical Exam    LABORATORY PANEL:   CBC  Recent Labs Lab 06/27/16 1607  WBC 15.5*  HGB 15.6  HCT 44.4  PLT 424   ------------------------------------------------------------------------------------------------------------------  Chemistries   Recent Labs Lab 06/27/16 1201  06/28/16 0544  NA 134*  < > 135  K 3.7  < > 4.2  CL 98  < > 101  CO2 30  < > 27  GLUCOSE 192*  < > 166*  BUN 6  < > 9  CREATININE 0.74  < > 0.87  CALCIUM 10.5  < > 9.6  AST 32  --   --   ALT 39*  --   --   ALKPHOS 76  --   --   BILITOT 0.3  --   --    < > = values in this interval not displayed. ------------------------------------------------------------------------------------------------------------------  Cardiac Enzymes  Recent Labs Lab 06/27/16 1942 06/27/16 2350 06/28/16 0544  TROPONINI 1.04* 0.83* 0.82*   ------------------------------------------------------------------------------------------------------------------  RADIOLOGY:  Dg Chest 2 View  06/27/2016  CLINICAL DATA:  Severe back/chest pain EXAM: CHEST  2 VIEW COMPARISON:  11/30/2015 FINDINGS: Lungs are clear.  No pleural effusion or pneumothorax. The heart is normal in size. Visualized osseous structures are within normal limits. IMPRESSION: Normal chest radiographs. Electronically Signed   By: Julian Hy M.D.   On: 06/27/2016 12:27     ASSESSMENT AND PLAN:    45 year old female with history of refractory hypertension and anxiety who presented to the ER with elevated troponin and melena hypertension.  1. Elevated troponin: This is either due to ACS or melena hypertension. Appreciate cardiology consult. Plan for CTA to rule out aortic dissection. If this is normal then cardiac catheterization is recommended.  2. Malignant hypertension: Blood pressure actually has improved. Patient has had extensive workup as an outpatient including Renal workup and endocrine workup. Continue Coreg, enalapril, verapamil and Aldactone.  3. Depression and anxiety: Continue clonazepam and Lamictal.  4. Tobacco dependence: Patient was highly encouraged to stop smoking. Patient was re-counseled for 3 minutes.  5. Hyperlipidemia: Continue Zocor. Management plans discussed with the patient and she is in agreement.  CODE STATUS: full  TOTAL TIME TAKING CARE OF THIS PATIENT: 30 minutes.  POSSIBLE D/C tomorrow, DEPENDING ON CLINICAL CONDITION.   Caridad Silveira M.D on 06/28/2016 at 11:36 AM  Between 7am to 6pm - Pager - [phone] After 6pm go to [email or web address] -  password [email or web address] [facility]  Office  [phone]  CC: Primary care physician; Arnette Norris, MD  Note: This dictation was prepared with Dragon dictation along with smaller phrase technology. Any transcriptional errors that result from this process are unintentional.

## 2016-06-29 ENCOUNTER — Inpatient Hospital Stay (HOSPITAL_COMMUNITY)
Admission: AD | Admit: 2016-06-29 | Discharge: 2016-07-13 | DRG: 236 | Disposition: A | Discharge status: Home / Self Care | Payer: BLUE CROSS/BLUE SHIELD | Source: Other Acute Inpatient Hospital | Attending: Cardiothoracic Surgery | Admitting: Cardiothoracic Surgery

## 2016-06-29 ENCOUNTER — Other Ambulatory Visit: Payer: Self-pay | Admitting: *Deleted

## 2016-06-29 ENCOUNTER — Encounter (HOSPITAL_COMMUNITY): Payer: Self-pay | Admitting: Physician Assistant

## 2016-06-29 ENCOUNTER — Inpatient Hospital Stay (HOSPITAL_COMMUNITY): Payer: BLUE CROSS/BLUE SHIELD

## 2016-06-29 ENCOUNTER — Encounter
Admission: EM | Disposition: A | Discharge status: Other Acute Inpatient Hospital | Payer: Self-pay | Source: Home / Self Care | Attending: Internal Medicine

## 2016-06-29 DIAGNOSIS — I700 Atherosclerosis of aorta: Secondary | ICD-10-CM | POA: Diagnosis not present

## 2016-06-29 DIAGNOSIS — R001 Bradycardia, unspecified: Secondary | ICD-10-CM | POA: Insufficient documentation

## 2016-06-29 DIAGNOSIS — Z7982 Long term (current) use of aspirin: Secondary | ICD-10-CM

## 2016-06-29 DIAGNOSIS — I2510 Atherosclerotic heart disease of native coronary artery without angina pectoris: Secondary | ICD-10-CM

## 2016-06-29 DIAGNOSIS — Z8249 Family history of ischemic heart disease and other diseases of the circulatory system: Secondary | ICD-10-CM

## 2016-06-29 DIAGNOSIS — E1151 Type 2 diabetes mellitus with diabetic peripheral angiopathy without gangrene: Secondary | ICD-10-CM | POA: Diagnosis not present

## 2016-06-29 DIAGNOSIS — Y838 Other surgical procedures as the cause of abnormal reaction of the patient, or of later complication, without mention of misadventure at the time of the procedure: Secondary | ICD-10-CM | POA: Diagnosis not present

## 2016-06-29 DIAGNOSIS — K760 Fatty (change of) liver, not elsewhere classified: Secondary | ICD-10-CM | POA: Diagnosis present

## 2016-06-29 DIAGNOSIS — F4320 Adjustment disorder, unspecified: Secondary | ICD-10-CM | POA: Diagnosis present

## 2016-06-29 DIAGNOSIS — F329 Major depressive disorder, single episode, unspecified: Secondary | ICD-10-CM | POA: Diagnosis present

## 2016-06-29 DIAGNOSIS — I25110 Atherosclerotic heart disease of native coronary artery with unstable angina pectoris: Secondary | ICD-10-CM | POA: Diagnosis present

## 2016-06-29 DIAGNOSIS — Z6833 Body mass index (BMI) 33.0-33.9, adult: Secondary | ICD-10-CM

## 2016-06-29 DIAGNOSIS — I6521 Occlusion and stenosis of right carotid artery: Secondary | ICD-10-CM | POA: Diagnosis not present

## 2016-06-29 DIAGNOSIS — Z7984 Long term (current) use of oral hypoglycemic drugs: Secondary | ICD-10-CM | POA: Diagnosis not present

## 2016-06-29 DIAGNOSIS — D62 Acute posthemorrhagic anemia: Secondary | ICD-10-CM | POA: Diagnosis not present

## 2016-06-29 DIAGNOSIS — I214 Non-ST elevation (NSTEMI) myocardial infarction: Secondary | ICD-10-CM | POA: Diagnosis present

## 2016-06-29 DIAGNOSIS — I119 Hypertensive heart disease without heart failure: Secondary | ICD-10-CM | POA: Diagnosis not present

## 2016-06-29 DIAGNOSIS — F4001 Agoraphobia with panic disorder: Secondary | ICD-10-CM | POA: Diagnosis present

## 2016-06-29 DIAGNOSIS — Z720 Tobacco use: Secondary | ICD-10-CM | POA: Diagnosis present

## 2016-06-29 DIAGNOSIS — E8881 Metabolic syndrome: Secondary | ICD-10-CM | POA: Diagnosis present

## 2016-06-29 DIAGNOSIS — Z833 Family history of diabetes mellitus: Secondary | ICD-10-CM

## 2016-06-29 DIAGNOSIS — J9589 Other postprocedural complications and disorders of respiratory system, not elsewhere classified: Secondary | ICD-10-CM | POA: Diagnosis not present

## 2016-06-29 DIAGNOSIS — F4310 Post-traumatic stress disorder, unspecified: Secondary | ICD-10-CM | POA: Diagnosis not present

## 2016-06-29 DIAGNOSIS — E8770 Fluid overload, unspecified: Secondary | ICD-10-CM | POA: Diagnosis not present

## 2016-06-29 DIAGNOSIS — E785 Hyperlipidemia, unspecified: Secondary | ICD-10-CM | POA: Diagnosis present

## 2016-06-29 DIAGNOSIS — Z9119 Patient's noncompliance with other medical treatment and regimen: Secondary | ICD-10-CM | POA: Diagnosis not present

## 2016-06-29 DIAGNOSIS — I6529 Occlusion and stenosis of unspecified carotid artery: Secondary | ICD-10-CM | POA: Insufficient documentation

## 2016-06-29 DIAGNOSIS — F4323 Adjustment disorder with mixed anxiety and depressed mood: Secondary | ICD-10-CM | POA: Diagnosis present

## 2016-06-29 DIAGNOSIS — I6523 Occlusion and stenosis of bilateral carotid arteries: Secondary | ICD-10-CM | POA: Diagnosis present

## 2016-06-29 DIAGNOSIS — E1165 Type 2 diabetes mellitus with hyperglycemia: Secondary | ICD-10-CM | POA: Diagnosis not present

## 2016-06-29 DIAGNOSIS — I10 Essential (primary) hypertension: Secondary | ICD-10-CM | POA: Diagnosis not present

## 2016-06-29 DIAGNOSIS — F17210 Nicotine dependence, cigarettes, uncomplicated: Secondary | ICD-10-CM | POA: Diagnosis present

## 2016-06-29 DIAGNOSIS — E1169 Type 2 diabetes mellitus with other specified complication: Secondary | ICD-10-CM | POA: Diagnosis present

## 2016-06-29 DIAGNOSIS — Z01810 Encounter for preprocedural cardiovascular examination: Secondary | ICD-10-CM | POA: Diagnosis not present

## 2016-06-29 DIAGNOSIS — E0859 Diabetes mellitus due to underlying condition with other circulatory complications: Secondary | ICD-10-CM | POA: Diagnosis present

## 2016-06-29 DIAGNOSIS — J9811 Atelectasis: Secondary | ICD-10-CM | POA: Diagnosis not present

## 2016-06-29 DIAGNOSIS — J90 Pleural effusion, not elsewhere classified: Secondary | ICD-10-CM | POA: Diagnosis not present

## 2016-06-29 DIAGNOSIS — I222 Subsequent non-ST elevation (NSTEMI) myocardial infarction: Secondary | ICD-10-CM | POA: Diagnosis not present

## 2016-06-29 DIAGNOSIS — I080 Rheumatic disorders of both mitral and aortic valves: Secondary | ICD-10-CM | POA: Diagnosis not present

## 2016-06-29 DIAGNOSIS — Z951 Presence of aortocoronary bypass graft: Secondary | ICD-10-CM

## 2016-06-29 DIAGNOSIS — F17201 Nicotine dependence, unspecified, in remission: Secondary | ICD-10-CM | POA: Diagnosis present

## 2016-06-29 DIAGNOSIS — E119 Type 2 diabetes mellitus without complications: Secondary | ICD-10-CM

## 2016-06-29 DIAGNOSIS — Z79899 Other long term (current) drug therapy: Secondary | ICD-10-CM

## 2016-06-29 HISTORY — DX: Other ill-defined heart diseases: I51.89

## 2016-06-29 HISTORY — PX: CARDIAC CATHETERIZATION: SHX172

## 2016-06-29 HISTORY — DX: Atherosclerotic heart disease of native coronary artery without angina pectoris: I25.10

## 2016-06-29 LAB — VAS US DOPPLER PRE CABG
LEFT ECA DIAS: -29 cm/s
Left CCA dist dias: -45 cm/s
Left CCA dist sys: -109 cm/s
Left CCA prox dias: 36 cm/s
Left CCA prox sys: 100 cm/s
Left ICA dist dias: -42 cm/s
Left ICA dist sys: -90 cm/s
Left ICA prox dias: -43 cm/s
Left ICA prox sys: -103 cm/s
RIGHT ECA DIAS: -38 cm/s
RIGHT VERTEBRAL DIAS: 32 cm/s
Right CCA prox dias: 18 cm/s
Right CCA prox sys: 101 cm/s
Right cca dist sys: -82 cm/s

## 2016-06-29 LAB — URINALYSIS, ROUTINE W REFLEX MICROSCOPIC
Bilirubin Urine: NEGATIVE
Glucose, UA: NEGATIVE mg/dL
Ketones, ur: NEGATIVE mg/dL
Leukocytes, UA: NEGATIVE
Nitrite: NEGATIVE
PROTEIN: NEGATIVE mg/dL
Specific Gravity, Urine: >1.046 — ABNORMAL HIGH (ref 1.005–1.030)
pH: 5.5 (ref 5.0–8.0)

## 2016-06-29 LAB — GLUCOSE, CAPILLARY
GLUCOSE-CAPILLARY: 118 mg/dL — AB (ref 65–99)
GLUCOSE-CAPILLARY: 113 mg/dL — AB (ref 65–99)
GLUCOSE-CAPILLARY: 124 mg/dL — AB (ref 65–99)
Glucose-Capillary: 156 mg/dL — ABNORMAL HIGH (ref 65–99)

## 2016-06-29 LAB — BASIC METABOLIC PANEL
Anion gap: 8 (ref 5–15)
BUN: 14 mg/dL (ref 6–20)
CALCIUM: 9.7 mg/dL (ref 8.9–10.3)
CO2: 26 mmol/L (ref 22–32)
CREATININE: 0.83 mg/dL (ref 0.44–1.00)
Chloride: 100 mmol/L — ABNORMAL LOW (ref 101–111)
GFR calc Af Amer: >60 mL/min (ref >60)
Glucose, Bld: 246 mg/dL — ABNORMAL HIGH (ref 65–99)
POTASSIUM: 4.3 mmol/L (ref 3.5–5.1)
SODIUM: 134 mmol/L — AB (ref 135–145)

## 2016-06-29 LAB — URINE MICROSCOPIC-ADD ON
Bacteria, UA: NONE SEEN
RBC / HPF: NONE SEEN RBC/hpf (ref 0–5)

## 2016-06-29 LAB — SURGICAL PCR SCREEN
MRSA, PCR: NEGATIVE
STAPHYLOCOCCUS AUREUS: NEGATIVE

## 2016-06-29 SURGERY — LEFT HEART CATH AND CORONARY ANGIOGRAPHY
Anesthesia: Moderate Sedation

## 2016-06-29 MED ORDER — ASPIRIN EC 81 MG PO TBEC
81.0000 mg | DELAYED_RELEASE_TABLET | Freq: Every day | ORAL | Status: DC
  Administered 2016-06-30 – 2016-07-05 (×6): 81 mg via ORAL
  Filled 2016-06-29 (×6): qty 1

## 2016-06-29 MED ORDER — NITROGLYCERIN 1 MG/10 ML FOR IR/CATH LAB
INTRA_ARTERIAL | Status: DC | PRN
  Administered 2016-06-29: 200 ug via INTRACORONARY

## 2016-06-29 MED ORDER — NITROGLYCERIN 0.4 MG SL SUBL: 0.4000 mg | SUBLINGUAL_TABLET | SUBLINGUAL | Status: DC | PRN

## 2016-06-29 MED ORDER — NITROGLYCERIN 2 % TD OINT
1.0000 [in_us] | TOPICAL_OINTMENT | Freq: Four times a day (QID) | TRANSDERMAL | Status: DC
  Administered 2016-06-29 – 2016-07-01 (×8): 1 [in_us] via TOPICAL
  Filled 2016-06-29: qty 30

## 2016-06-29 MED ORDER — SODIUM CHLORIDE 0.9% FLUSH: 3.0000 mL | Freq: Two times a day (BID) | INTRAVENOUS | Status: DC

## 2016-06-29 MED ORDER — VERAPAMIL HCL 120 MG PO TABS
120.0000 mg | ORAL_TABLET | Freq: Three times a day (TID) | ORAL | Status: DC
  Administered 2016-06-29 – 2016-07-02 (×5): 120 mg via ORAL
  Filled 2016-06-29 (×9): qty 1

## 2016-06-29 MED ORDER — BIVALIRUDIN BOLUS VIA INFUSION - CUPID
INTRAVENOUS | Status: DC | PRN
  Administered 2016-06-29: 72.15 mg via INTRAVENOUS

## 2016-06-29 MED ORDER — CARVEDILOL 25 MG PO TABS
25.0000 mg | ORAL_TABLET | Freq: Two times a day (BID) | ORAL | Status: DC
  Administered 2016-06-29 – 2016-07-02 (×7): 25 mg via ORAL
  Filled 2016-06-29 (×7): qty 1

## 2016-06-29 MED ORDER — HEPARIN (PORCINE) IN NACL 100-0.45 UNIT/ML-% IJ SOLN
1550.0000 [IU]/h | INTRAMUSCULAR | Status: DC
  Administered 2016-06-29: 1150 [IU]/h via INTRAVENOUS
  Administered 2016-06-30: 1550 [IU]/h via INTRAVENOUS
  Filled 2016-06-29 (×2): qty 250

## 2016-06-29 MED ORDER — SODIUM CHLORIDE 0.9 % WEIGHT BASED INFUSION
3.0000 mL/kg/h | INTRAVENOUS | Status: AC
  Administered 2016-06-29: 3 mL/kg/h via INTRAVENOUS

## 2016-06-29 MED ORDER — CLONAZEPAM 0.5 MG PO TABS
0.5000 mg | ORAL_TABLET | Freq: Three times a day (TID) | ORAL | Status: DC
  Administered 2016-06-29 – 2016-07-08 (×24): 0.5 mg via ORAL
  Filled 2016-06-29 (×24): qty 1

## 2016-06-29 MED ORDER — SODIUM CHLORIDE 0.9% FLUSH: 3.0000 mL | INTRAVENOUS | Status: DC | PRN

## 2016-06-29 MED ORDER — ADENOSINE (DIAGNOSTIC) 3 MG/ML IV SOLN
INTRAVENOUS | Status: AC
  Filled 2016-06-29: qty 30

## 2016-06-29 MED ORDER — BIVALIRUDIN 250 MG IV SOLR
INTRAVENOUS | Status: AC
  Filled 2016-06-29: qty 250

## 2016-06-29 MED ORDER — FLUOXETINE HCL 20 MG PO CAPS
40.0000 mg | ORAL_CAPSULE | Freq: Every day | ORAL | Status: DC
  Administered 2016-06-29 – 2016-07-08 (×9): 40 mg via ORAL
  Filled 2016-06-29 (×9): qty 2

## 2016-06-29 MED ORDER — FENTANYL CITRATE (PF) 100 MCG/2ML IJ SOLN
INTRAMUSCULAR | Status: DC | PRN
  Administered 2016-06-29 (×2): 25 ug via INTRAVENOUS

## 2016-06-29 MED ORDER — ATORVASTATIN CALCIUM 40 MG PO TABS
40.0000 mg | ORAL_TABLET | Freq: Every day | ORAL | Status: DC
  Administered 2016-06-29 – 2016-06-30 (×2): 40 mg via ORAL
  Filled 2016-06-29 (×2): qty 1

## 2016-06-29 MED ORDER — LAMOTRIGINE 100 MG PO TABS
200.0000 mg | ORAL_TABLET | Freq: Every day | ORAL | Status: DC
  Administered 2016-06-29 – 2016-07-08 (×9): 200 mg via ORAL
  Filled 2016-06-29 (×4): qty 2
  Filled 2016-06-29 (×2): qty 1
  Filled 2016-06-29 (×4): qty 2

## 2016-06-29 MED ORDER — ATORVASTATIN CALCIUM 80 MG PO TABS: 80.0000 mg | ORAL_TABLET | Freq: Every day | ORAL | Status: DC

## 2016-06-29 MED ORDER — HEPARIN (PORCINE) IN NACL 2-0.9 UNIT/ML-% IJ SOLN
INTRAMUSCULAR | Status: AC
  Filled 2016-06-29: qty 500

## 2016-06-29 MED ORDER — MORPHINE SULFATE (PF) 4 MG/ML IV SOLN
4.0000 mg | INTRAVENOUS | Status: DC | PRN
  Administered 2016-07-01 – 2016-07-04 (×3): 4 mg via INTRAVENOUS
  Filled 2016-06-29 (×4): qty 1

## 2016-06-29 MED ORDER — ENALAPRIL MALEATE 20 MG PO TABS
20.0000 mg | ORAL_TABLET | Freq: Every day | ORAL | Status: DC
  Administered 2016-06-29 – 2016-07-05 (×7): 20 mg via ORAL
  Filled 2016-06-29 (×8): qty 1

## 2016-06-29 MED ORDER — FENTANYL CITRATE (PF) 100 MCG/2ML IJ SOLN
INTRAMUSCULAR | Status: AC
  Filled 2016-06-29: qty 2

## 2016-06-29 MED ORDER — SPIRONOLACTONE 25 MG PO TABS
25.0000 mg | ORAL_TABLET | Freq: Every day | ORAL | Status: DC
  Administered 2016-06-29 – 2016-07-05 (×7): 25 mg via ORAL
  Filled 2016-06-29 (×7): qty 1

## 2016-06-29 MED ORDER — INSULIN ASPART 100 UNIT/ML ~~LOC~~ SOLN
0.0000 [IU] | Freq: Three times a day (TID) | SUBCUTANEOUS | Status: DC
  Administered 2016-06-30 (×2): 3 [IU] via SUBCUTANEOUS
  Administered 2016-06-30: 2 [IU] via SUBCUTANEOUS
  Administered 2016-07-01: 5 [IU] via SUBCUTANEOUS
  Administered 2016-07-01 – 2016-07-02 (×2): 3 [IU] via SUBCUTANEOUS
  Administered 2016-07-02: 2 [IU] via SUBCUTANEOUS
  Administered 2016-07-02: 3 [IU] via SUBCUTANEOUS
  Administered 2016-07-03 – 2016-07-04 (×4): 2 [IU] via SUBCUTANEOUS
  Administered 2016-07-05: 3 [IU] via SUBCUTANEOUS
  Administered 2016-07-05: 2 [IU] via SUBCUTANEOUS

## 2016-06-29 MED ORDER — MIDAZOLAM HCL 2 MG/2ML IJ SOLN
INTRAMUSCULAR | Status: DC | PRN
  Administered 2016-06-29 (×2): 1 mg via INTRAVENOUS

## 2016-06-29 MED ORDER — SODIUM CHLORIDE 0.9 % WEIGHT BASED INFUSION: 1.0000 mL/kg/h | INTRAVENOUS | Status: DC

## 2016-06-29 MED ORDER — OXYCODONE HCL 5 MG PO TABS
5.0000 mg | ORAL_TABLET | ORAL | Status: DC | PRN
  Administered 2016-07-01 – 2016-07-04 (×2): 5 mg via ORAL
  Filled 2016-06-29 (×2): qty 1

## 2016-06-29 MED ORDER — ACETAMINOPHEN 325 MG PO TABS
650.0000 mg | ORAL_TABLET | ORAL | Status: DC | PRN
  Filled 2016-06-29: qty 2

## 2016-06-29 MED ORDER — NICOTINE 21 MG/24HR TD PT24
21.0000 mg | MEDICATED_PATCH | Freq: Every day | TRANSDERMAL | Status: DC
  Administered 2016-06-29 – 2016-07-05 (×7): 21 mg via TRANSDERMAL
  Filled 2016-06-29 (×7): qty 1

## 2016-06-29 MED ORDER — SODIUM CHLORIDE 0.9 % IV SOLN
250.0000 mg | INTRAVENOUS | Status: DC | PRN
  Administered 2016-06-29: 1.75 mg/kg/h via INTRAVENOUS

## 2016-06-29 MED ORDER — NITROGLYCERIN 5 MG/ML IV SOLN
INTRAVENOUS | Status: AC
  Filled 2016-06-29: qty 10

## 2016-06-29 MED ORDER — ONDANSETRON HCL 4 MG/2ML IJ SOLN: 4.0000 mg | Freq: Four times a day (QID) | INTRAMUSCULAR | Status: DC | PRN

## 2016-06-29 MED ORDER — MIDAZOLAM HCL 2 MG/2ML IJ SOLN
INTRAMUSCULAR | Status: AC
  Filled 2016-06-29: qty 2

## 2016-06-29 MED ORDER — ASPIRIN 81 MG PO CHEW: 81.0000 mg | CHEWABLE_TABLET | ORAL | Status: DC

## 2016-06-29 MED ORDER — ASPIRIN 81 MG PO CHEW
CHEWABLE_TABLET | ORAL | Status: AC
  Filled 2016-06-29: qty 4

## 2016-06-29 MED ORDER — SODIUM CHLORIDE 0.9 % IV SOLN: 250.0000 mL | INTRAVENOUS | Status: DC | PRN

## 2016-06-29 MED ORDER — IOPAMIDOL (ISOVUE-300) INJECTION 61%
INTRAVENOUS | Status: DC | PRN
  Administered 2016-06-29: 180 mL via INTRA_ARTERIAL

## 2016-06-29 SURGICAL SUPPLY — 15 items
CATH INFINITI 5FR ANG PIGTAIL (CATHETERS) ×1 IMPLANT
CATH INFINITI 5FR JL4 (CATHETERS) ×1 IMPLANT
CATH INFINITI JR4 5F (CATHETERS) ×1 IMPLANT
CATH VISTA GUIDE 6FR JL4 (CATHETERS) ×1 IMPLANT
DEVICE CLOSURE MYNXGRIP 6/7F (Vascular Products) ×1 IMPLANT
KIT MANI 3VAL PERCEP (MISCELLANEOUS) ×2 IMPLANT
NDL PERC 18GX7CM (NEEDLE) IMPLANT
NEEDLE PERC 18GX7CM (NEEDLE) ×2 IMPLANT
NEEDLE SMART 18G ACCESS (NEEDLE) ×1 IMPLANT
PACK CARDIAC CATH (CUSTOM PROCEDURE TRAY) ×2 IMPLANT
SHEATH AVANTI 5FR X 11CM (SHEATH) ×1 IMPLANT
SHEATH AVANTI 6FR X 11CM (SHEATH) ×1 IMPLANT
VALVE COPILOT STAT (MISCELLANEOUS) ×1 IMPLANT
WIRE EMERALD 3MM-J .035X150CM (WIRE) ×1 IMPLANT
WIRE PRESSURE VERRATA (WIRE) IMPLANT

## 2016-06-29 NOTE — Discharge Summary (Signed)
[facility] [place] NAME: Erika [name]    MR#:  [id number]  DATE OF BIRTH:  Feb 06, 1971  DATE OF ADMISSION:  06/27/2016 ADMITTING PHYSICIAN: Lytle Butte, MD  DATE OF DISCHARGE: 06/29/2016  PRIMARY CARE PHYSICIAN: Arnette Norris, MD    ADMISSION DIAGNOSIS:  Cp NSTEMI  DISCHARGE DIAGNOSIS:  Principal Problem: NSTEMI   HTN (hypertension), malignant Active Problems:   HLD (hyperlipidemia)    Tobacco abuse   Essential hypertension   Malignant hypertension   Uncontrolled type 2 diabetes mellitus with complication, without long-term current use of insulin ([place])   SECONDARY DIAGNOSIS:   Past Medical History  Diagnosis Date  . Malignant hypertension   . HLD (hyperlipidemia)   . Arthralgia of temporomandibular joint   . PTSD (post-traumatic stress disorder)   . Depression   . Fatty liver disease, nonalcoholic [id number]  . Type 2 diabetes mellitus [facility]) January 2017  . Obesity   . Tobacco abuse     HOSPITAL COURSE:   45 year old female with history of refractory hypertension and anxiety who presented to the ER with elevated troponin and melena hypertension.  1.NSTEMI: Patient underwent CTA which did nto show dieesction,. Cardiac cath shows Moderate distal left main disease, pressures dampened on engagement of the ostium with 5 [place] catheter Severe mid LAD disease, no improvement after nitroglycerin given, disease located at the ostial diagonal takeoff Moderate diagonal disease Severe mid circumflex disease, ostial to mid region is diffusely diseased Severe mid circumflex disease, dominant vessel  Given three-vessel disease including moderate left main disease and diabetes She would likely benefit best from bypass surgery. She will be transferred to [place] for evaluation for CABG.   2. Malignant hypertension: Blood pressure actually improved. We are questioning compliance with medications as an outpatient. Patient has had  extensive workup as an outpatient including Renal workup and endocrine workup. Continue Coreg, enalapril, verapamil and Aldactone.  3. Depression and anxiety: Continue clonazepam and Lamictal.  4. Tobacco dependence: Patient was highly encouraged to stop smoking. Patient was re-counseled for 3 minutes.  5. Hyperlipidemia: Continue high intensity statin.  DISCHARGE CONDITIONS AND DIET:   Cardiac Stable for transfer  CONSULTS OBTAINED:  Treatment Team:  Minna Merritts, MD Lytle Butte, MD  DRUG ALLERGIES:  No Known Allergies  DISCHARGE MEDICATIONS:   Current Discharge Medication List    START taking these medications   Details  atorvastatin (LIPITOR) 80 MG tablet Take 1 tablet (80 mg total) by mouth daily at 6 PM. Qty: 30 tablet, Refills: 0      CONTINUE these medications which have NOT CHANGED   Details  aspirin EC 81 MG tablet Take 1 tablet (81 mg total) by mouth daily. Qty: 90 tablet, Refills: 3    carvedilol (COREG) 25 MG tablet Take 1 tablet (25 mg total) by mouth 2 (two) times daily. Qty: 60 tablet, Refills: 3    clonazePAM (KLONOPIN) 0.5 MG tablet Take 1 tablet (0.5 mg total) by mouth 3 (three) times daily. Qty: 90 tablet, Refills: 2   Associated Diagnoses: Major depressive disorder, recurrent episode, moderate (HCC)    enalapril (VASOTEC) 20 MG tablet Take 1 tablet (20 mg total) by mouth daily. Qty: 30 tablet, Refills: 5    FLUoxetine (PROZAC) 40 MG capsule Take 1 capsule (40 mg total) by mouth daily. Qty: 30 capsule, Refills: 2   Associated Diagnoses: Major depressive disorder, recurrent episode, moderate (HCC)    hydrochlorothiazide (HYDRODIURIL) 25 MG tablet TAKE 1  pill daily Qty: 30 tablet, Refills: 2    HYDROcodone-acetaminophen (NORCO) 5-325 MG tablet Take 1 tablet by mouth every 6 (six) hours as needed for moderate pain. Qty: 90 tablet, Refills: 0    lamoTRIgine (LAMICTAL) 200 MG tablet Take 1 tablet (200 mg total) by mouth daily. Qty: 30  tablet, Refills: 2   Associated Diagnoses: Major depressive disorder, recurrent episode, moderate (HCC)    spironolactone (ALDACTONE) 25 MG tablet Take 1 tablet (25 mg total) by mouth daily. Qty: 30 tablet, Refills: 3    verapamil (CALAN) 120 MG tablet Take 1 tablet (120 mg total) by mouth 3 (three) times daily. Qty: 90 tablet, Refills: 3      STOP taking these medications     metFORMIN (GLUCOPHAGE) 500 MG tablet      simvastatin (ZOCOR) 20 MG tablet               Today   CHIEF COMPLAINT:  Had cath with above results   VITAL SIGNS:  Blood pressure 129/86, pulse 67, temperature 98 F (36.7 C), temperature source Oral, resp. rate 15, height 5\' 7"  (1.702 m), weight 96.163 kg (212 lb), last menstrual period 06/21/2016, SpO2 95 %.   REVIEW OF SYSTEMS:  Review of Systems  Constitutional: Negative for fever, chills and malaise/fatigue.  HENT: Negative for ear discharge, ear pain, hearing loss, nosebleeds and sore throat.   Eyes: Negative for blurred vision and pain.  Respiratory: Positive for shortness of breath. Negative for cough, hemoptysis and wheezing.   Cardiovascular: Negative for chest pain, palpitations and leg swelling.  Gastrointestinal: Negative for nausea, vomiting, abdominal pain, diarrhea and blood in stool.  Genitourinary: Negative for dysuria.  Musculoskeletal: Negative for back pain.  Neurological: Negative for dizziness, tremors, speech change, focal weakness, seizures and headaches.  Endo/Heme/Allergies: Does not bruise/bleed easily.  Psychiatric/Behavioral: Negative for depression, suicidal ideas and hallucinations.     PHYSICAL EXAMINATION:  GENERAL:  45 y.o.-year-old patient lying in the bed with no acute distress.  NECK:  Supple, no jugular venous distention. No thyroid enlargement, no tenderness.  LUNGS: Normal breath sounds bilaterally, no wheezing, rales,rhonchi  No use of accessory muscles of respiration.  CARDIOVASCULAR: S1, S2 normal. No  murmurs, rubs, or gallops.  ABDOMEN: Soft, non-tender, non-distended. Bowel sounds present. No organomegaly or mass.  EXTREMITIES: No pedal edema, cyanosis, or clubbing.  PSYCHIATRIC: The patient is alert and oriented x 3.  SKIN: No obvious rash, lesion, or ulcer.   DATA REVIEW:   CBC  Recent Labs Lab 06/27/16 1607  WBC 15.5*  HGB 15.6  HCT 44.4  PLT 424    Chemistries   Recent Labs Lab 06/27/16 1201  06/29/16 0330  NA 134*  < > 134*  K 3.7  < > 4.3  CL 98  < > 100*  CO2 30  < > 26  GLUCOSE 192*  < > 246*  BUN 6  < > 14  CREATININE 0.74  < > 0.83  CALCIUM 10.5  < > 9.7  AST 32  --   --   ALT 39*  --   --   ALKPHOS 76  --   --   BILITOT 0.3  --   --   < > = values in this interval not displayed.  Cardiac Enzymes  Recent Labs Lab 06/27/16 1942 06/27/16 2350 06/28/16 0544  TROPONINI 1.04* 0.83* 0.82*    Microbiology Results  @[email or web address] @  RADIOLOGY:  Dg Chest 2 View  06/27/2016  CLINICAL DATA:  Severe back/chest pain EXAM: CHEST  2 VIEW COMPARISON:  11/30/2015 FINDINGS: Lungs are clear.  No pleural effusion or pneumothorax. The heart is normal in size. Visualized osseous structures are within normal limits. IMPRESSION: Normal chest radiographs. Electronically Signed   By: Julian Hy M.D.   On: 06/27/2016 12:27   Ct Angio Chest/abd/pel For Dissection W And/or W/wo  06/28/2016  EXAM: CT ANGIOGRAPHY CHEST, ABDOMEN, PELVIS WITH CONTRAST TECHNIQUE: Multidetector CT imaging of the chest, abdomen, pelvis was performed using the standard protocol during bolus administration of intravenous contrast. Multiplanar CT image reconstructions and MIPs were obtained to evaluate the vascular anatomy. CONTRAST:  100 mL Isovue 370 IV COMPARISON:  05/28/2009 FINDINGS: CHEST Vascular: Left arm IV contrast administration. Innominate vein and SVC are patent. Right atrium and right ventricle are nondilated. Satisfactory opacification of pulmonary arteries noted, and there is no  evidence of pulmonary emboli. Patent bilateral pulmonary veins drain into the left atrium. Scattered coronary calcifications most evident on the noncontrast scan. Adequate contrast opacification of the thoracic aorta with no evidence of dissection, aneurysm, or stenosis. There is patent brachiocephalic arch anatomy without proximal stenosis. Partially calcified atheromatous plaque in the aortic arch and descending segment. Mediastinum/Lymph Nodes: No masses or pathologically enlarged lymph nodes identified. No pericardial effusion. Lungs/Pleura: 26mm noncalcified nodule, superior segment right lower lobe image 20/3. Subsegmental dependent atelectasis or scarring posteriorly in both lower lobes. No pleural effusion. No pneumothorax. Musculoskeletal: No chest wall mass or suspicious bone lesions identified. ABDOMEN Arterial Aorta: Moderate atheromatous plaque particularly in the infrarenal segment, without aneurysm, dissection, or stenosis. Celiac axis:          Patent Superior mesenteric:  Patent Left renal: Minimal ostial plaque, nonocclusive. Patent distally. Right renal:          Patent Inferior mesenteric:  Patent Left iliac: Partially calcified plaque through the common and internal iliac arteries without high-grade stenosis, aneurysm or dissection. External iliac patent. Common femoral patent. Right iliac: Mild common iliac and proximal internal iliac plaque. No aneurysm, dissection, or high-grade stenosis. Venous Dedicated venous phase imaging not obtained. Patent bilateral renal veins, portal vein, splenic vein. Review of the MIP images confirms the above findings. Nonvasular Hepatobiliary: No masses or other significant abnormality. Pancreas: No mass, inflammatory changes, or other significant abnormality. Spleen: Within normal limits in size and appearance. Adrenals/Urinary Tract: No masses identified. No evidence of hydronephrosis. Stomach/Bowel: No evidence of obstruction, inflammatory process, or abnormal  fluid collections. Normal appendix. Scattered diverticula, most numerous in the distal descending segment, without significant adjacent inflammatory/edematous change. Lymphatic: No pathologically enlarged lymph nodes. Reproductive: No mass or other significant abnormality. Other: No ascites.  No free air. Musculoskeletal: Obesity. Stable discogenic sclerosis adjacent to the inferior plate of L4. Negative for fracture or other acute bone lesion. Subcutaneous foci of gas and fluid in the anterior right lower quadrant and left lower quadrant body wall presumably related to recent injections. Tiny umbilical hernia containing only mesenteric fat. IMPRESSION: 1. Negative for aortic dissection or pulmonary emboli. 2. 6 mm nonspecific nodule, superior segment right lower lobe. Non-contrast chest CT at 6-12 months is recommended. If the nodule is stable at time of repeat CT, then future CT at 18-24 months (from today's scan) is considered optional for low-risk patients, but is recommended for high-risk patients. This recommendation follows the consensus statement: Guidelines for Management of Incidental Pulmonary Nodules Detected on CT Images:From the Fleischner Society 2017; published online before print (10.1148/radiol.IJ:2314499). 3. Atherosclerosis, including Aortic Atherosclerosis (ICD10-170.0), iliac and coronary  artery disease. Please note that although the presence of coronary artery calcium documents the presence of coronary artery disease, the severity of this disease and any potential stenosis cannot be assessed on this non-gated CT examination. Assessment for potential risk factor modification, dietary therapy or pharmacologic therapy may be warranted, if clinically indicated. 4. Colonic diverticulosis. Electronically Signed   By: Lucrezia Europe M.D.   On: 06/28/2016 12:40      Management plans discussed with the patient and she is in agreement. Stable for discharge [place]  Patient should follow up with  [place]:     Code Status Orders        Start     Ordered   06/27/16 1639  Full code   Continuous     06/27/16 1641    Code Status History    Date Active Date Inactive Code Status Order ID Comments User Context   This patient has a current code status but no historical code status.    Advance Directive Documentation        Most Recent Value   Type of Advance Directive  Healthcare Power of Attorney, Living will   Pre-existing out of facility DNR order (yellow form or pink MOST form)     "MOST" Form in Place?        TOTAL TIME TAKING CARE OF THIS PATIENT: 38 minutes.    Note: This dictation was prepared with Dragon dictation along with smaller phrase technology. Any transcriptional errors that result from this process are unintentional.  Thera Basden M.D on 06/29/2016 at 10:10 AM  Between 7am to 6pm - Pager - [phone] After 6pm go to [email or web address] - password [email or web address] [facility]  Office  [phone]  CC: Primary care physician; Arnette Norris, MD

## 2016-06-29 NOTE — H&P (Signed)
History and Physical  Patient ID: OSHA BORUTA MRN: [id number], DOB: 04/16/1971 Admit Date: (Not on file) Date of Encounter: 06/29/2016, 11:11 AM Primary Physician: Arnette Norris, MD Primary Cardiologist: Dr. Rockey Situ, MD  Chief Complaint: Chest/back pain Reason for Admission: NSTEMI  HPI: 45 y.o. female with h/o recently diagnosed CAD s/p NSTEMI 06/28/16 with cardiac cath showing severe 3-vessel disease, poorly controlled diabetes, refractory hypertension with concerns for possible medication noncompliance, ongoing tobacco abuse, metabolic syndrome, and fatty liver disease who presented to [facility] on 7/6 with back/chest pain and elevated troponin in the outpatient setting at 0.94.   She has documented HTN for at least 18 years. Her blood pressure once again became uncontrolled in 2016. She was hospitalized for hypotension and bradycardia with a borderline lactic acid. She followed up with her PCP for pheochromocytoma workup which was negative. Her workup for secondary HTN has been negative as well with a normal aldosterone to renin ratio. Her renal artery duplex was suggestive of significant right renal artery stenosis. However, she recently underwent angiography which showed minimal disease involving the right renal artery and a normal left renal artery. She is followed by Dr. Holley Raring, MD for mild proteinuria with a negative workup. Patient has recently had a negative Myoview on 04/13/2016. Echo from 03/12/2016 showed an EF of 60-65%, normal wall motion, GR1DD. At her last cardiology visit with [facility] her BP was noted to be 200/120. She reported compliance with her medications. She has been enrolled in a study at [facility] for refractory HTN, no notes are on file in [place]. She was seeing her PCP on 06/28/16, and noted episodes of back, left axilla pain, and chest pain that were associated with diaphoresis, emesis and SOB. Her pain radiated down her bilateral arms. She had a stat D-dimer, troponin, and CXR  done by PCP as an outpatient. Her troponin came back at 0.94 Her D-dimer was negative a 0.42. CXR was normal. BP at PCP office was in the 99991111 systolic. She was advised to go to the [facility] ED by her PCP.  Upon cardiology seeing the patient on 7/6 she reported an intermittent history of midline back pain since Father's Day with some chest discomfort not described as pain. Pain was not associated with exertion. Pain will last anywhere from 15 minutes to 6 hours and will resolve on its own. She has associated nausea, vomiting, and diaphoresis. Her pain will radiate to the left side of her neck and down her bilateral arms, left greater than right. At time of cardiology seeing the patient she was without pain.   Patient's grandmother developed significant HTN in her 20's as well. Sadly, she passed from an aneurysm (patient does not know location) around the age the patient currently is.   Upon her arrival to [facility] ED her initial ED troponin was 0.81-->1.04-->0.83-->0.82. CXR normal. EKG showed NSR, 94 bpm, previous inferior infarct, early repolarization, down sloping st depression along inferolateral leads. WBC was 15.5. SCr 0.76. K+ 3.8-->4.2. BP 205/120. No CTA aorta performed to evaluate for dissection at time of admission on 7/5 in the ED. She was started on a nitro patch. Her BP remained in the 123456 to 99991111 systolic in the ED. Upon taking her home medications her BP dropped to 123456 systolic and has remained well controlled in the 1-teen to 123456 systolic range. Evaluation options were discussed with the patient on 7/6 regarding her history and symptoms. She underwent CTA chest on 7/6 to evaluate for aortic dissection given her  longstanding profound HTN which was negative for evidence of aortic dissection or PE. She was hydrated overnight and underwent cardiac catheterization on 2016/07/22 that showed ostial left main to left main 40% stenosed, ostial LAD 40% stenosed, proximal LAD 95% stenosed, ostial LCx to proximal  LCx 60% stenosed, proximal LCx 95% stenosed, mid LCx 60% stenosed, mid RCA 95% stenosed, D2 50% stenosed. LVSF was normal. Pressures dampened on engagement of the ostial left main with 5 French catheter. She had no improvement after nitroglycerin in the mid LAD. Case was discussed between Drs. Gollan and Arida who both felt given the patient's comorbidites including metabolic syndrome she would most benefit from transfer to [facility] for evaluation of cardiac bypass.   Past Medical History  Diagnosis Date  . Malignant hypertension   . HLD (hyperlipidemia)   . Arthralgia of temporomandibular joint   . PTSD (post-traumatic stress disorder)   . Depression   . Fatty liver disease, nonalcoholic Q000111Q  . Type 2 diabetes mellitus [facility]) January 2017  . Obesity   . Tobacco abuse   . CAD, multiple vessel     a. cath 07-22-2016: ostLM 40%, ostLAD 40%, pLAD 95%, ost-pLCx 60%, pLCx 95%, mLCx 60%, mRCA 95%, D2 50%, LVSF nl  . Diastolic dysfunction     a.e cho 06/28/16: EF 50-55%, mild inf wall HK, GR1DD, mild MR, RV sys fxn nl, mildly dilated LA, PASP nl     Most Recent Cardiac Studies: Cardiac catheterization 07/22/16: Coronary Findings    Dominance: Co-dominant   Left Main   . Ost LM to LM lesion, 40% stenosed.     Left Anterior Descending   . Ost LAD lesion, 40% stenosed.   . Prox LAD lesion, 95% stenosed.   . Second Diagonal Branch   . 2nd Diag lesion, 50% stenosed.     Left Circumflex   . Ost Cx to Prox Cx lesion, 60% stenosed. Severely Calcified diffuse.   . Prox Cx lesion, 95% stenosed.   . Mid Cx lesion, 60% stenosed.     Right Coronary Artery   . Mid RCA lesion, 95% stenosed.      Wall Motion                 Left Heart    Left Ventricle The left ventricular size is normal. The left ventricular systolic function is normal. There are wall motion abnormalities in the left ventricle. There are segmental wall motion abnormalities in the left ventricle. Mild  inferior wall hypokinesis   Mitral Valve There is no mitral valve stenosis, no mitral valve regurgitation and no mitral valve prolapse evident. The annulus is normal.   Aortic Valve There is no aortic valve stenosis. No calcification found in the aortic valve. There is normal aortic valve motion.   Aorta The size of the ascending aorta is normal.    Coronary Diagrams    Diagnostic Diagram         Case discussed with Dr. Fletcher Anon. Initially was unclear if left main was severe and degree of stenosis of the mid LAD was not appreciated. Dr. Fletcher Anon gave intercoronary nitroglycerin which clearly documented mid LAD lesion .   Recommendations:   Plan is to transfer [facility] for consideration of bypass surgery. She will need medical risk factor modification. Poor control diabetes, hyperlipidemia, smoker. Morbid obesity Needs significant lifestyle changes  Echo 06/28/2016: Study Conclusions - Left ventricle: The cavity size was normal. There was mild  concentric hypertrophy. Systolic function was normal.  The  estimated ejection fraction was in the range of 50% to 55%.  Select image suggestive of mild inferior wall hypokinesis.  Doppler parameters are consistent with abnormal left ventricular  relaxation (grade 1 diastolic dysfunction). - Mitral valve: There was mild regurgitation. - Left atrium: The atrium was normal in size. - Right ventricle: Systolic function was normal. - Pulmonary arteries: Systolic pressure was within the normal  range.   Surgical History:  Past Surgical History  Procedure Laterality Date  . Cholecystectomy    . Tonsillectomy    . Cesarean section    . Peripheral vascular catheterization [name] 04/18/2016    Procedure: Renal Angiography;  Surgeon: Wellington Hampshire, MD;  Location: [place] [facility];  Service: Cardiovascular;  Laterality: [name];     Home Meds: Prior to Admission medications   Medication Sig Start Date End Date Taking? Authorizing  Provider  aspirin EC 81 MG tablet Take 1 tablet (81 mg total) by mouth daily. 04/06/16   Wellington Hampshire, MD  atorvastatin (LIPITOR) 80 MG tablet Take 1 tablet (80 mg total) by mouth daily at 6 PM. 06/29/16   Bettey Costa, MD  carvedilol (COREG) 25 MG tablet Take 1 tablet (25 mg total) by mouth 2 (two) times daily. 02/24/16   Wellington Hampshire, MD  clonazePAM (KLONOPIN) 0.5 MG tablet Take 1 tablet (0.5 mg total) by mouth 3 (three) times daily. 06/19/16 06/19/17  Kathlee Nations, MD  enalapril (VASOTEC) 20 MG tablet Take 1 tablet (20 mg total) by mouth daily. 01/12/16   Lucille Passy, MD  FLUoxetine (PROZAC) 40 MG capsule Take 1 capsule (40 mg total) by mouth daily. 06/19/16   Kathlee Nations, MD  hydrochlorothiazide (HYDRODIURIL) 25 MG tablet TAKE 1 pill daily Patient taking differently: Take 25 mg by mouth daily. TAKE 1 pill daily 01/12/16   Lucille Passy, MD  HYDROcodone-acetaminophen (NORCO) 5-325 MG tablet Take 1 tablet by mouth every 6 (six) hours as needed for moderate pain. 06/27/16   Lucille Passy, MD  lamoTRIgine (LAMICTAL) 200 MG tablet Take 1 tablet (200 mg total) by mouth daily. 06/19/16   Kathlee Nations, MD  metFORMIN (GLUCOPHAGE) 500 MG tablet Take 1 tablet (500 mg total) by mouth daily with breakfast. Patient taking differently: Take 500 mg by mouth 2 (two) times daily with a meal.  12/08/15   Lucille Passy, MD  simvastatin (ZOCOR) 20 MG tablet TAKE 1 TABLET (20 MG TOTAL) BY MOUTH EVERY EVENING. Patient taking differently: Take 20 mg by mouth daily. TAKE 1 TABLET (20 MG TOTAL) BY MOUTH EVERY EVENING. 04/25/15   Lucille Passy, MD  spironolactone (ALDACTONE) 25 MG tablet Take 1 tablet (25 mg total) by mouth daily. 05/14/16   Wellington Hampshire, MD  verapamil (CALAN) 120 MG tablet Take 1 tablet (120 mg total) by mouth 3 (three) times daily. 01/12/16   Lucille Passy, MD    Allergies: No Known Allergies  Social History   Social History  . Marital Status: Married    Spouse Name: [name]  . Number of Children: [name]  .  Years of Education: [name]   Occupational History  . Not on file.   Social History Main Topics  . Smoking status: Current Every Day Smoker -- 0.50 packs/day    Types: Cigarettes  . Smokeless tobacco: Never Used  . Alcohol Use: No  . Drug Use: No  . Sexual Activity: Yes    Birth Control/ Protection: None   Other  Topics Concern  . Not on file   Social History Narrative     Family History  Problem Relation Age of Onset  . Adopted: Yes  . Diabetes Mother   . Diabetes Father   . Alcohol abuse Father   . Heart disease Father   . Drug abuse Father   . Stroke Sister   . Anxiety disorder Sister     Review of Systems: Review of Systems  Constitutional: Positive for malaise/fatigue and diaphoresis. Negative for fever, chills and weight loss.  HENT: Negative for congestion.   Eyes: Negative for discharge and redness.  Respiratory: Positive for shortness of breath. Negative for cough, hemoptysis, sputum production and wheezing.   Cardiovascular: Positive for chest pain. Negative for palpitations, orthopnea, claudication, leg swelling and PND.  Gastrointestinal: Positive for nausea. Negative for vomiting and abdominal pain.  Musculoskeletal: Negative for myalgias and falls.  Skin: Negative for rash.  Neurological: Positive for weakness. Negative for dizziness, sensory change, speech change, focal weakness and loss of consciousness.  Endo/Heme/Allergies: Does not bruise/bleed easily.  Psychiatric/Behavioral: Positive for substance abuse. The patient is not nervous/anxious.        Ongoing tobacco abuse  All other systems reviewed and are negative.   Labs:   Lab Results  Component Value Date   WBC 15.5* 06/27/2016   HGB 15.6 06/27/2016   HCT 44.4 06/27/2016   MCV 86.2 06/27/2016   PLT 424 06/27/2016    Recent Labs Lab 06/27/16 1201  06/29/16 0330  NA 134*  < > 134*  K 3.7  < > 4.3  CL 98  < > 100*  CO2 30  < > 26  BUN 6  < > 14  CREATININE 0.74  < > 0.83  CALCIUM 10.5   < > 9.7  PROT 7.7  --   --   BILITOT 0.3  --   --   ALKPHOS 76  --   --   ALT 39*  --   --   AST 32  --   --   GLUCOSE 192*  < > 246*  < > = values in this interval not displayed.  Recent Labs  06/27/16 1607 06/27/16 1942 06/27/16 2350 06/28/16 0544  TROPONINI 0.81* 1.04* 0.83* 0.82*   Lab Results  Component Value Date   CHOL 218* 06/28/2016   HDL 23* 06/28/2016   LDLCALC UNABLE TO CALCULATE IF TRIGLYCERIDE OVER 400 mg/dL 06/28/2016   TRIG 436* 06/28/2016   Lab Results  Component Value Date   DDIMER 0.42 06/27/2016    Radiology/Studies:  Dg Chest 2 View  06/27/2016  IMPRESSION: Normal chest radiographs. Electronically Signed   By: Julian Hy M.D.   On: 06/27/2016 12:27   Ct Angio Chest/abd/pel For Dissection W And/or W/wo  06/28/2016  IMPRESSION: 1. Negative for aortic dissection or pulmonary emboli. 2. 6 mm nonspecific nodule, superior segment right lower lobe. Non-contrast chest CT at 6-12 months is recommended. If the nodule is stable at time of repeat CT, then future CT at 18-24 months (from today's scan) is considered optional for low-risk patients, but is recommended for high-risk patients. This recommendation follows the consensus statement: Guidelines for Management of Incidental Pulmonary Nodules Detected on CT Images:From the Fleischner Society 2017; published online before print (10.1148/radiol.SG:5268862). 3. Atherosclerosis, including Aortic Atherosclerosis (ICD10-170.0), iliac and coronary artery disease. Please note that although the presence of coronary artery calcium documents the presence of coronary artery disease, the severity of this disease and any potential stenosis cannot  be assessed on this non-gated CT examination. Assessment for potential risk factor modification, dietary therapy or pharmacologic therapy may be warranted, if clinically indicated. 4. Colonic diverticulosis. Electronically Signed   By: Lucrezia Europe M.D.   On: 06/28/2016 12:40     EKG:  Interpreted by me showed: NSR, 94 bpm, nonspecific st/t changes Telemetry: Interpreted by me showed: NSR, 60's bpm  Weights: Weight: 212 lb (96.163 kg)   Physical Exam: Blood pressure 120/73, pulse 72, temperature 97.5 F (36.4 C), temperature source Oral, resp. rate 16, height 5\' 7"  (1.702 m), weight 212 lb (96.163 kg), last menstrual period 06/21/2016, SpO2 91 %. Body mass index is 33.2 kg/(m^2), Last menstrual period 06/21/2016. General: Well developed, well nourished, in no acute distress. Head: Normocephalic, atraumatic, sclera non-icteric, no xanthomas, nares are without discharge.  Neck: Negative for carotid bruits. JVD not elevated. Lungs: Clear bilaterally to auscultation without wheezes, rales, or rhonchi. Breathing is unlabored. Heart: RRR with S1 S2. No murmurs, rubs, or gallops appreciated. Abdomen: Obese, soft, non-tender, non-distended with normoactive bowel sounds. No hepatomegaly. No rebound/guarding. No obvious abdominal masses. Msk:  Strength and tone appear normal for age. Extremities: No clubbing or cyanosis. No edema. Distal pedal pulses are 2+ and equal bilaterally. Neuro: Alert and oriented X 3. No focal deficit. No facial asymmetry. Moves all extremities spontaneously. Psych:  Responds to questions appropriately with a normal affect.    ASSESSMENT AND PLAN:  Principal Problem:   NSTEMI (non-ST elevated myocardial infarction) ([place]) Active Problems:   HTN (hypertension), malignant   Tobacco abuse   CAD in native artery   Uncontrolled type 2 diabetes mellitus with complication, without long-term current use of insulin (HCC)   Metabolic syndrome   HLD (hyperlipidemia)   ADJUSTMENT DISORDER WITH MIXED FEATURES   Agoraphobia with panic attacks   MDD (major depressive disorder) ([place])   1. NSTEMI/severe 3-vessel CAD as above: -Transfer to [facility] for further evaluation by TCTS for possible cardiac bypass  -Currently without chest pain -Restart  full-dose heparin gtt 8 hours s/p sheath pull -Aspirin -She has not received any Plavix -Continue Coreg, Lipitor, enalapril, nitro paste, morphine, and oxygen  -Cardiac rehab  2. Malignant hypertension: -Concerns for medication noncompliance in the outpatient setting given poorly controlled HTN in the 123456 systolic at home, though with continued home medications in the inpatient setting her blood pressure has been well controlled in the 1-teens to Q000111Q systolic -Compliance is recommended  -Continue current antihypertensives (Coreg, Vasotec, Calan, Aldactone)  3. Poorly controlled DM: -Metformin held s/p cardiac cath -SSI per IM  4. HLD: -Simvastatin changed to Lipitor 80 mg daily at time of consult -Goal LDL <70 -Needs recheck in outpatient setting  5. Obesity: -Weight loss and lifestyle changes advised -Cardiac rehab as above  6. Metabolic syndrome: -As above  7. Ongoing tobacco abuse: -On Nicotine patch -Cessation advised  8. Major depressive disorder/PTSD/panic attacks: -Prozac -Klonopin  -Lamictal    Signed, Christell Faith, PA-C [facility] Pager: [phone] 06/29/2016, 11:11 AM   History and all data above reviewed.  Patient examined.  I agree with the findings as above.  All available labs, radiology testing, previous records reviewed. Agree with documented assessment and plan. Ms. Marbut is a 84F with poorly-controlled DM (A1c 10.4%), poorly-controlled hypertension, ongoing tobacco abuse and non-compliance here with NSTEMI and found to have 3 vessel CAD. Planned for CABG on 7/14 with Dr. Prescott Gum.  She is hemodynamically stable and doing well.  She denies chest pain.  Will continue heparin pre-operatively.  Smoking cessation and medication compliance were stressed.  Shadman Tozzi C. Oval Linsey, MD, [facility]  06/29/16  10:38 PM

## 2016-06-29 NOTE — Progress Notes (Signed)
Cardiac catheterization report Please see report for complete details Moderate distal left main disease, pressures dampened on engagement of the ostium with 5 French catheter Severe mid LAD disease, no improvement after nitroglycerin given, disease located at the ostial diagonal takeoff Moderate diagonal disease Severe mid circumflex disease, ostial to mid region is diffusely diseased Severe mid circumflex disease, dominant vessel  Images reviewed with Dr. Fletcher Anon,  Given three-vessel disease including moderate left main disease, Patient is diabetic She would likely benefit best from bypass surgery She has indicated she would like to go to [place] Given class IV angina, unstable, will transfer to [facility] today via [facility] No Plavix given  Signed, Esmond Plants, MD, Ph.D [facility]

## 2016-06-29 NOTE — Progress Notes (Signed)
Report from Yasmin RN. Patient off unit for cardiac cath. Will assess when she returns to room.

## 2016-06-29 NOTE — Consult Note (Signed)
[address]       [place] [address]             [phone]        Erika [name] [facility] Medical Record [id number] Date of Birth: [birth year]  Referring: Dr. Darlin Drop  Primary Care: Arnette Norris, MD  Chief Complaint:   Acute MI  Patient examined, cardiac catheterization and echocardiogram and CTA of chest personally reviewed and counseled with patient  History of Present Illness:     45 year old Caucasian female diabetic smoker is admitted for her first episode of heart disease. For the past 2 weeks she has had atypical upper back pain shoulder pain and arm pain nausea shortness of breath and fatigue. Her family physician checked her EKG and troponin. The troponin was elevated at 1.0 and she was admitted to [facility] regional. She has history of severe hypertension and is followed by nephrology. CTA of the thoracic aorta was negative for dissection. The patient was transferred to [place]. Echocardiogram showed LVH with good LV function no significant valvular disease no abnormality of the aortic root. Cardiac catheterization was performed today which demonstrates 95% stenosis approximately, 95% stenosis the RCA, 90% stenosis of the circumflex. LV gram was not performed because of her renal history. LVEDP was 22 mmHg.  Current Activity/ Functional Status: Patient has normal functional level but has anxiety depression issues.   Zubrod Score: At the time of surgery this patient's most appropriate activity status/level should be described as: []     0    Normal activity, no symptoms []     1    Restricted in physical strenuous activity but ambulatory, able to do out light work [x]     2    Ambulatory and capable of self care, unable to do work activities, up and about                 more than 50%  Of the time                            []     3    Only limited self care, in bed greater than 50% of waking hours []     4    Completely disabled, no self care,  confined to bed or chair []     5    Moribund  Past Medical History  Diagnosis Date  . Malignant hypertension   . HLD (hyperlipidemia)   . Arthralgia of temporomandibular joint   . PTSD (post-traumatic stress disorder)   . Depression   . Fatty liver disease, nonalcoholic Q000111Q  . Type 2 diabetes mellitus [facility]) January 2017  . Obesity   . Tobacco abuse   . CAD, multiple vessel     a. cath 06/29/16: ostLM 40%, ostLAD 40%, pLAD 95%, ost-pLCx 60%, pLCx 95%, mLCx 60%, mRCA 95%, D2 50%, LVSF nl  . Diastolic dysfunction     a.e cho 06/28/16: EF 50-55%, mild inf wall HK, GR1DD, mild MR, RV sys fxn nl, mildly dilated LA, PASP nl    Past Surgical History  Procedure Laterality Date  . Cholecystectomy    . Tonsillectomy    . Cesarean section    . Peripheral vascular catheterization [name] 04/18/2016    Procedure: Renal Angiography;  Surgeon: Wellington Hampshire, MD;  Location: [place] [facility];  Service: Cardiovascular;  Laterality: [name];  . Cardiac catheterization [name] 06/29/2016  Procedure: Left Heart Cath and Coronary Angiography;  Surgeon: Minna Merritts, MD;  Location: [place] [facility];  Service: Cardiovascular;  Laterality: [name];    History  Smoking status  . Current Every Day Smoker -- 0.50 packs/day  . Types: Cigarettes  Smokeless tobacco  . Never Used    History  Alcohol Use No    Social History   Social History  . Marital Status: Married    Spouse Name: [name]  . Number of Children: [name]  . Years of Education: [name]   Occupational History  . Not on file.   Social History Main Topics  . Smoking status: Current Every Day Smoker -- 0.50 packs/day    Types: Cigarettes  . Smokeless tobacco: Never Used  . Alcohol Use: No  . Drug Use: No  . Sexual Activity: Yes    Birth Control/ Protection: None   Other Topics Concern  . Not on file   Social History Narrative    No Known Allergies  Current Facility-Administered Medications  Medication Dose Route Frequency Provider Last  Rate Last Dose  . acetaminophen (TYLENOL) tablet 650 mg  650 mg Oral Q4H PRN Erma Heritage, [place]      . [START ON 06/30/2016] aspirin EC tablet 81 mg  81 mg Oral Daily [place], [place]      . atorvastatin (LIPITOR) tablet 40 mg  40 mg Oral q1800 Erma Heritage, [place]      . carvedilol (COREG) tablet 25 mg  25 mg Oral BID WC [place], [place]      . clonazePAM [facility]) tablet 0.5 mg  0.5 mg Oral TID Erma Heritage, PA   0.5 mg at 06/29/16 1445  . enalapril (VASOTEC) tablet 20 mg  20 mg Oral Daily Erma Heritage, [place]   20 mg at 06/29/16 1444  . FLUoxetine (PROZAC) capsule 40 mg  40 mg Oral Daily Erma Heritage, [place]   40 mg at 06/29/16 1320  . heparin ADULT infusion 100 units/mL (25000 units/248mL sodium chloride 0.45%)  1,150 Units/hr Intravenous Continuous Rebecka Apley, RPH      . lamoTRIgine (LAMICTAL) tablet 200 mg  200 mg Oral Daily Erma Heritage, [place]   200 mg at 06/29/16 1444  . morphine 4 MG/ML injection 4 mg  4 mg Intravenous Q4H PRN Erma Heritage, PA      . nicotine (NICODERM CQ - dosed in mg/24 hours) patch 21 mg  21 mg Transdermal Daily Erma Heritage, [place]   21 mg at 06/29/16 1330  . nitroGLYCERIN (NITROGLYN) 2 % ointment 1 inch  1 inch Topical Q6H Erma Heritage, [place]   1 inch at 06/29/16 1449  . nitroGLYCERIN (NITROSTAT) SL tablet 0.4 mg  0.4 mg Sublingual Q5 Min x 3 PRN Erma Heritage, [place]      . ondansetron [facility]) injection 4 mg  4 mg Intravenous Q6H PRN Erma Heritage, [place]      . oxyCODONE (Oxy IR/ROXICODONE) immediate release tablet 5 mg  5 mg Oral Q4H PRN Erma Heritage, [place]      . spironolactone (ALDACTONE) tablet 25 mg  25 mg Oral Daily Erma Heritage, [place]   25 mg at 06/29/16 1320  . verapamil (CALAN) tablet 120 mg  120 mg Oral TID Erma Heritage, PA   120 mg at 06/29/16 1600    Prescriptions prior to admission  Medication Sig Dispense Refill Last Dose  . aspirin EC 81  MG tablet Take 1 tablet (81 mg total) by mouth daily.  90 tablet 3 Past Week at Unknown time  . carvedilol (COREG) 25 MG tablet Take 1 tablet (25 mg total) by mouth 2 (two) times daily. 60 tablet 3 Past Week at 1900  . clonazePAM (KLONOPIN) 0.5 MG tablet Take 1 tablet (0.5 mg total) by mouth 3 (three) times daily. 90 tablet 2 Past Week at Unknown time  . enalapril (VASOTEC) 20 MG tablet Take 1 tablet (20 mg total) by mouth daily. 30 tablet 5 Past Week at Unknown time  . FLUoxetine (PROZAC) 40 MG capsule Take 1 capsule (40 mg total) by mouth daily. 30 capsule 2 Past Week at Unknown time  . hydrochlorothiazide (HYDRODIURIL) 25 MG tablet TAKE 1 pill daily (Patient taking differently: Take 25 mg by mouth daily. TAKE 1 pill daily) 30 tablet 2 Past Week at Unknown time  . HYDROcodone-acetaminophen (NORCO) 5-325 MG tablet Take 1 tablet by mouth every 6 (six) hours as needed for moderate pain. 90 tablet 0 Past Week at Unknown time  . lamoTRIgine (LAMICTAL) 200 MG tablet Take 1 tablet (200 mg total) by mouth daily. 30 tablet 2 Past Week at Unknown time  . metFORMIN (GLUCOPHAGE) 500 MG tablet Take 500 mg by mouth daily with breakfast.   Past Week at Unknown time  . simvastatin (ZOCOR) 20 MG tablet Take 20 mg by mouth daily.   Past Week at Unknown time  . spironolactone (ALDACTONE) 25 MG tablet Take 1 tablet (25 mg total) by mouth daily. 30 tablet 3 Past Week at Unknown time  . verapamil (CALAN) 120 MG tablet Take 1 tablet (120 mg total) by mouth 3 (three) times daily. 90 tablet 3 Past Week at Unknown time  . atorvastatin (LIPITOR) 80 MG tablet Take 1 tablet (80 mg total) by mouth daily at 6 PM. (Patient not taking: Reported on 06/29/2016) 30 tablet 0     Family History  Problem Relation Age of Onset  . Adopted: Yes  . Diabetes Mother   . Diabetes Father   . Alcohol abuse Father   . Heart disease Father   . Drug abuse Father   . Stroke Sister   . Anxiety disorder Sister      Review of Systems:       Cardiac Review of Systems: Y or N  Chest Pain [  Yes   ]  Resting SOB [no   ] Exertional SOB  [place]  ]  Orthopnea [ no ]   Pedal Edema [no   ]    Palpitations [no  ] Syncope  [no  ]   Presyncope [  no ]  General Review of Systems: [Y] = yes [  ]=no Constitional: recent weight change [  ]; anorexia [  ]; fatigue [  ]; nausea [  ]; night sweats [  ]; fever [  ]; or chills [  ]                                                               Dental: poor dentition[  ]; Last Dentist visit: Greater than one year  Eye : blurred vision [  ]; diplopia [   ]; vision changes [  ];  Amaurosis fugax[  ]; Resp: cough [  ];  wheezing[  ];  hemoptysis[  ]; shortness of breath[  yes]; paroxysmal nocturnal dyspnea[  ]; dyspnea on exertion[ yes ]; or orthopnea[  ];  GI:  gallstones[ yes status post cholecystectomy  ], vomiting[  ];  dysphagia[  ]; melena[  ];  hematochezia [  ]; heartburn[ yes ];   Hx of  Colonoscopy[  ]; GU: kidney stones [  ]; hematuria[  ];   dysuria [  ];  nocturia[  ];  history of     obstruction [  ]; urinary frequency [  ] history of proteinuria             Skin: rash, swelling[  ];, hair loss[  ];  peripheral edema[  ];  or itching[  ]; Musculosketetal: myalgias[  ];  joint swelling[  ];  joint erythema[  ];  joint pain[  ];  back pain[  ];  Heme/Lymph: bruising[  ];  bleeding[  ];  anemia[  ];  Neuro: TIA[  ];  headaches[  ];  stroke[  ];  vertigo[  ];  seizures[  ];   paresthesias[  ];  difficulty walking[  ];  Psych:depression[  ]; anxiety[  ];  Endocrine: diabetes[ yes poorly controlled ];  thyroid dysfunction[  ];  Immunizations: Flu [  ]; Pneumococcal[  ];  Other: Right-hand dominant, no history of thoracic trauma rib fracture or pneumothorax   Physical Exam: BP 149/94 mmHg  Pulse 75  Temp(Src) 97.9 F (36.6 C) (Oral)  Resp 22  Ht 5\' 7"  (1.702 m)  Wt 215 lb 13.3 oz (97.9 kg)  BMI 33.80 kg/m2  SpO2 96%  LMP 06/21/2016      Physical Exam  General: Pleasant overweight Caucasian female no acute distress in the CCU HEENT:  Normocephalic pupils equal , dentition adequate Neck: Supple without JVD, adenopathy, + right carotid bruit and carotid stenosis by duplex ultrasound Chest: Clear to auscultation, symmetrical breath sounds, no rhonchi, no tenderness             or deformity Cardiovascular: Regular rate and rhythm, no murmur, no gallop, peripheral pulses             palpable in all extremities Abdomen:  Soft, nontender, no palpable mass or organomegaly Extremities: Warm, well-perfused, no clubbing cyanosis edema or tenderness,              no venous stasis changes of the legs, no groin hematoma Rectal/GU: Deferred Neuro: Grossly non--focal and symmetrical throughout Skin: Clean and dry without rash or ulceration    Diagnostic Studies & Laboratory data:     Recent Radiology Findings:   Ct Angio Chest/abd/pel For Dissection W And/or W/wo  06/28/2016  EXAM: CT ANGIOGRAPHY CHEST, ABDOMEN, PELVIS WITH CONTRAST TECHNIQUE: Multidetector CT imaging of the chest, abdomen, pelvis was performed using the standard protocol during bolus administration of intravenous contrast. Multiplanar CT image reconstructions and MIPs were obtained to evaluate the vascular anatomy. CONTRAST:  100 mL Isovue 370 IV COMPARISON:  05/28/2009 FINDINGS: CHEST Vascular: Left arm IV contrast administration. Innominate vein and SVC are patent. Right atrium and right ventricle are nondilated. Satisfactory opacification of pulmonary arteries noted, and there is no evidence of pulmonary emboli. Patent bilateral pulmonary veins drain into the left atrium. Scattered coronary calcifications most evident on the noncontrast scan. Adequate contrast opacification of the thoracic aorta with no evidence of dissection, aneurysm, or stenosis. There is patent brachiocephalic arch anatomy without proximal stenosis. Partially calcified atheromatous plaque in the aortic arch  and descending segment. Mediastinum/Lymph Nodes: No masses or pathologically enlarged lymph  nodes identified. No pericardial effusion. Lungs/Pleura: 52mm noncalcified nodule, superior segment right lower lobe image 20/3. Subsegmental dependent atelectasis or scarring posteriorly in both lower lobes. No pleural effusion. No pneumothorax. Musculoskeletal: No chest wall mass or suspicious bone lesions identified. ABDOMEN Arterial Aorta: Moderate atheromatous plaque particularly in the infrarenal segment, without aneurysm, dissection, or stenosis. Celiac axis:          Patent Superior mesenteric:  Patent Left renal: Minimal ostial plaque, nonocclusive. Patent distally. Right renal:          Patent Inferior mesenteric:  Patent Left iliac: Partially calcified plaque through the common and internal iliac arteries without high-grade stenosis, aneurysm or dissection. External iliac patent. Common femoral patent. Right iliac: Mild common iliac and proximal internal iliac plaque. No aneurysm, dissection, or high-grade stenosis. Venous Dedicated venous phase imaging not obtained. Patent bilateral renal veins, portal vein, splenic vein. Review of the MIP images confirms the above findings. Nonvasular Hepatobiliary: No masses or other significant abnormality. Pancreas: No mass, inflammatory changes, or other significant abnormality. Spleen: Within normal limits in size and appearance. Adrenals/Urinary Tract: No masses identified. No evidence of hydronephrosis. Stomach/Bowel: No evidence of obstruction, inflammatory process, or abnormal fluid collections. Normal appendix. Scattered diverticula, most numerous in the distal descending segment, without significant adjacent inflammatory/edematous change. Lymphatic: No pathologically enlarged lymph nodes. Reproductive: No mass or other significant abnormality. Other: No ascites.  No free air. Musculoskeletal: Obesity. Stable discogenic sclerosis adjacent to the inferior plate of L4. Negative for fracture or other acute bone lesion. Subcutaneous foci of gas and fluid in the  anterior right lower quadrant and left lower quadrant body wall presumably related to recent injections. Tiny umbilical hernia containing only mesenteric fat. IMPRESSION: 1. Negative for aortic dissection or pulmonary emboli. 2. 6 mm nonspecific nodule, superior segment right lower lobe. Non-contrast chest CT at 6-12 months is recommended. If the nodule is stable at time of repeat CT, then future CT at 18-24 months (from today's scan) is considered optional for low-risk patients, but is recommended for high-risk patients. This recommendation follows the consensus statement: Guidelines for Management of Incidental Pulmonary Nodules Detected on CT Images:From the Fleischner Society 2017; published online before print (10.1148/radiol.IJ:2314499). 3. Atherosclerosis, including Aortic Atherosclerosis (ICD10-170.0), iliac and coronary artery disease. Please note that although the presence of coronary artery calcium documents the presence of coronary artery disease, the severity of this disease and any potential stenosis cannot be assessed on this non-gated CT examination. Assessment for potential risk factor modification, dietary therapy or pharmacologic therapy may be warranted, if clinically indicated. 4. Colonic diverticulosis. Electronically Signed   By: Lucrezia Europe M.D.   On: 06/28/2016 12:40     I have independently reviewed the above radiologic studies.  Recent Lab Findings: Lab Results  Component Value Date   WBC 15.5* 06/27/2016   HGB 15.6 06/27/2016   HCT 44.4 06/27/2016   PLT 424 06/27/2016   GLUCOSE 246* 06/29/2016   CHOL 218* 06/28/2016   TRIG 436* 06/28/2016   HDL 23* 06/28/2016   LDLDIRECT 177.1 04/22/2012   LDLCALC UNABLE TO CALCULATE IF TRIGLYCERIDE OVER 400 mg/dL 06/28/2016   ALT 39* 06/27/2016   AST 32 06/27/2016   NA 134* 06/29/2016   K 4.3 06/29/2016   CL 100* 06/29/2016   CREATININE 0.83 06/29/2016   BUN 14 06/29/2016   CO2 26 06/29/2016   TSH 2.25 12/08/2015   INR 1.04  06/27/2016  HGBA1C 10.4* 06/28/2016      Assessment / Plan:    Non-ST elevation MI, unstable angina Severe multivessel coronary disease with diabetic pattern Preserved LV systolic function Poorly controlled hypertension Poorly controlled diabetes Active smoking Asymptomatic right carotid stenosis  Patient be placed on heparin and monitored carefully. She will be prepared for multivessel CABG during this admission. She will be seen and evaluated by vascular surgery prior to surgery. She understands the recommendation for surgical coronary revascularization as well as indications benefits ,alternatives and she agrees to proceed.        @ME1 @ 06/29/2016 4:17 PM

## 2016-06-29 NOTE — Care Management Note (Signed)
Case Management Note  Patient Details  Name: Erika [name] MRN: [id number] Date of Birth: 02/03/1971  Subjective/Objective:     Adm w nstemi, transf from [facility]               Action/Plan:lives w husband, pcp dr Deborra Medina   Expected Discharge Date:                Expected Discharge Plan:  [place]  In-House Referral:     Discharge planning Services     Post Acute Care Choice:    Choice offered to:     DME Arranged:    DME Agency:     HH Arranged:    HH Agency:     Status of Service:     If discussed at [employer], dates discussed:    Additional Comments: for cvts eval  Lacretia Leigh, RN 06/29/2016, 1:37 PM

## 2016-06-29 NOTE — Progress Notes (Signed)
Pre-op Cardiac Surgery  Carotid Findings:  Right ICA 80-99% stenosis with velocities 588/263, Left ICA mild intimal thickening without stenosis.  Results given to Dr. Nils Pyle.  Upper Extremity Right Left  Brachial Pressures  142  Radial Waveforms Normal  Normal  Ulnar Waveforms Normal  Normal  Palmar Arch (Allen's Test) WNL WNL   Findings:  WNL Bilaterally     Lower  Extremity Right Left  Dorsalis Pedis 164 154  Posterior Tibial 155 159  Ankle/Brachial Indices 1.1 1.5    Findings:  Normal at rest.

## 2016-06-29 NOTE — Progress Notes (Signed)
Spoke with Psychologist, counselling. Patient is transferring to [facility] for bypass. [facility] to pick up patient from [place] has already called report to receiving floor. Husband came to room to pick up belongings; said if we find anything he may have forgotten we can send it to his mom, Ms. Harville, who is a Marine scientist on the third floor here at [facility].

## 2016-06-29 NOTE — Progress Notes (Signed)
    Patient is down for cardiac cath this morning. Blood pressure has been much improved this admission in the 1-teens systolic. Discussed with Dr. Fletcher Anon, MD on 7/6. This raises the question of compliance issues at home and if she is taking her antihypertensives in the outpatient setting. This will need to be addressed prior to her discharge. BMET stable. Lipids with TC 218, TG 436, unable to calculate LDL. She has been changed to Lipitor from simvastatin this admission. Lifestyle changes are recommended. Await results of cardiac cath later this morning.

## 2016-06-29 NOTE — Progress Notes (Signed)
hANTICOAGULATION CONSULT NOTE - Initial Consult  Pharmacy Consult for Heparin Indication: chest pain/ACS  No Known Allergies  Patient Measurements: Height: 5\' 7"  (170.2 cm) Weight: 215 lb 13.3 oz (97.9 kg) IBW/kg (Calculated) : 61.6 Heparin Dosing Weight: 83 kg  Vital Signs: Temp: 98 F (36.7 C) (07/07 0710) Temp Source: Oral (07/07 0710) BP: 136/78 mmHg (07/07 1200) Pulse Rate: 61 (07/07 1200)  Labs:  Recent Labs  06/27/16 1607 06/27/16 1942 06/27/16 2350 06/28/16 0544 06/29/16 0330  HGB 15.6  --   --   --   --   HCT 44.4  --   --   --   --   PLT 424  --   --   --   --   APTT 33  --   --   --   --   LABPROT 13.8  --   --   --   --   INR 1.04  --   --   --   --   CREATININE 0.76  --   --  0.87 0.83  TROPONINI 0.81* 1.04* 0.83* 0.82*  --     Estimated Creatinine Clearance: 106.1 mL/min (by C-G formula based on Cr of 0.83).   Medical History: Past Medical History  Diagnosis Date  . Malignant hypertension   . HLD (hyperlipidemia)   . Arthralgia of temporomandibular joint   . PTSD (post-traumatic stress disorder)   . Depression   . Fatty liver disease, nonalcoholic Q000111Q  . Type 2 diabetes mellitus [facility]) January 2017  . Obesity   . Tobacco abuse   . CAD, multiple vessel     a. cath 06/29/16: ostLM 40%, ostLAD 40%, pLAD 95%, ost-pLCx 60%, pLCx 95%, mLCx 60%, mRCA 95%, D2 50%, LVSF nl  . Diastolic dysfunction     a.e cho 06/28/16: EF 50-55%, mild inf wall HK, GR1DD, mild MR, RV sys fxn nl, mildly dilated LA, PASP nl    Medications:  Prescriptions prior to admission  Medication Sig Dispense Refill Last Dose  . aspirin EC 81 MG tablet Take 1 tablet (81 mg total) by mouth daily. 90 tablet 3 Taking  . atorvastatin (LIPITOR) 80 MG tablet Take 1 tablet (80 mg total) by mouth daily at 6 PM. 30 tablet 0   . carvedilol (COREG) 25 MG tablet Take 1 tablet (25 mg total) by mouth 2 (two) times daily. 60 tablet 3 Taking  . clonazePAM (KLONOPIN) 0.5 MG tablet Take 1 tablet  (0.5 mg total) by mouth 3 (three) times daily. 90 tablet 2 Taking  . enalapril (VASOTEC) 20 MG tablet Take 1 tablet (20 mg total) by mouth daily. 30 tablet 5 Taking  . FLUoxetine (PROZAC) 40 MG capsule Take 1 capsule (40 mg total) by mouth daily. 30 capsule 2 Taking  . hydrochlorothiazide (HYDRODIURIL) 25 MG tablet TAKE 1 pill daily (Patient taking differently: Take 25 mg by mouth daily. TAKE 1 pill daily) 30 tablet 2 Taking  . HYDROcodone-acetaminophen (NORCO) 5-325 MG tablet Take 1 tablet by mouth every 6 (six) hours as needed for moderate pain. 90 tablet 0   . lamoTRIgine (LAMICTAL) 200 MG tablet Take 1 tablet (200 mg total) by mouth daily. 30 tablet 2 Taking  . spironolactone (ALDACTONE) 25 MG tablet Take 1 tablet (25 mg total) by mouth daily. 30 tablet 3 Taking  . verapamil (CALAN) 120 MG tablet Take 1 tablet (120 mg total) by mouth 3 (three) times daily. 90 tablet 3 Taking   Scheduled:  . [START ON  06/30/2016] aspirin EC  81 mg Oral Daily  . atorvastatin  40 mg Oral q1800  . carvedilol  25 mg Oral BID WC  . clonazePAM  0.5 mg Oral TID  . enalapril  20 mg Oral Daily  . FLUoxetine  40 mg Oral Daily  . lamoTRIgine  200 mg Oral Daily  . nicotine  21 mg Transdermal Daily  . nitroGLYCERIN  1 inch Topical Q6H  . spironolactone  25 mg Oral Daily  . verapamil  120 mg Oral TID   Infusions:    Assessment: 45yo female with history of CAD s/p NSTEMI 06/28/16 with cath revealing severe 3-vessel disease, DM2 and refractory HTN. Pharmacy is consulted to dose heparin for ACS/NSTEMI following cath.   Goal of Therapy:  Heparin level 0.3-0.7 units/ml Monitor platelets by anticoagulation protocol: Yes   Plan:  Start heparin infusion at 1150 units/hr Check anti-Xa level in 6 hours and daily while on heparin Continue to monitor H&H and platelets  Andrey Cota. Diona Foley, PharmD, BCPS Clinical Pharmacist Pager [phone] 06/29/2016,12:32 PM

## 2016-06-29 NOTE — Progress Notes (Signed)
Discussed sternal precautions, IS, mobility post op, and d/c planning. Voiced understanding. Gave her OHS booklet, guideline, and video to watch. We planned to ambulate however vascular studies came. Sts she has not had any angina today although did have some yesterday, relieved with NTG/morphine. Will f/u tomorrow for ambulation if appropriate. [place] 3:28 PM 06/29/2016

## 2016-06-30 ENCOUNTER — Inpatient Hospital Stay (HOSPITAL_COMMUNITY): Payer: BLUE CROSS/BLUE SHIELD

## 2016-06-30 DIAGNOSIS — Z720 Tobacco use: Secondary | ICD-10-CM

## 2016-06-30 DIAGNOSIS — I6521 Occlusion and stenosis of right carotid artery: Secondary | ICD-10-CM

## 2016-06-30 DIAGNOSIS — I10 Essential (primary) hypertension: Secondary | ICD-10-CM

## 2016-06-30 DIAGNOSIS — I214 Non-ST elevation (NSTEMI) myocardial infarction: Secondary | ICD-10-CM

## 2016-06-30 DIAGNOSIS — E1165 Type 2 diabetes mellitus with hyperglycemia: Secondary | ICD-10-CM

## 2016-06-30 DIAGNOSIS — E785 Hyperlipidemia, unspecified: Secondary | ICD-10-CM

## 2016-06-30 DIAGNOSIS — E118 Type 2 diabetes mellitus with unspecified complications: Secondary | ICD-10-CM

## 2016-06-30 DIAGNOSIS — I2510 Atherosclerotic heart disease of native coronary artery without angina pectoris: Secondary | ICD-10-CM

## 2016-06-30 LAB — GLUCOSE, CAPILLARY
GLUCOSE-CAPILLARY: 126 mg/dL — AB (ref 65–99)
Glucose-Capillary: 177 mg/dL — ABNORMAL HIGH (ref 65–99)
Glucose-Capillary: 154 mg/dL — ABNORMAL HIGH (ref 65–99)
Glucose-Capillary: 166 mg/dL — ABNORMAL HIGH (ref 65–99)

## 2016-06-30 LAB — COMPREHENSIVE METABOLIC PANEL
ALBUMIN: 3 g/dL — AB (ref 3.5–5.0)
ALK PHOS: 82 U/L (ref 38–126)
ALT: 59 U/L — ABNORMAL HIGH (ref 14–54)
ANION GAP: 7 (ref 5–15)
AST: 33 U/L (ref 15–41)
BILIRUBIN TOTAL: 0.5 mg/dL (ref 0.3–1.2)
BUN: 6 mg/dL (ref 6–20)
CALCIUM: 9.8 mg/dL (ref 8.9–10.3)
CO2: 28 mmol/L (ref 22–32)
Chloride: 102 mmol/L (ref 101–111)
Creatinine, Ser: 0.67 mg/dL (ref 0.44–1.00)
GFR calc Af Amer: >60 mL/min (ref >60)
GFR calc non Af Amer: >60 mL/min (ref >60)
GLUCOSE: 147 mg/dL — AB (ref 65–99)
Potassium: 3.9 mmol/L (ref 3.5–5.1)
Sodium: 137 mmol/L (ref 135–145)
TOTAL PROTEIN: 6.3 g/dL — AB (ref 6.5–8.1)

## 2016-06-30 LAB — CBC
HCT: 39.4 % (ref 36.0–46.0)
HEMOGLOBIN: 12.8 g/dL (ref 12.0–15.0)
MCH: 29.5 pg (ref 26.0–34.0)
MCHC: 32.5 g/dL (ref 30.0–36.0)
MCV: 90.8 fL (ref 78.0–100.0)
Platelets: 349 10*3/uL (ref 150–400)
RBC: 4.34 MIL/uL (ref 3.87–5.11)
RDW: 12.9 % (ref 11.5–15.5)
WBC: 11.3 10*3/uL — ABNORMAL HIGH (ref 4.0–10.5)

## 2016-06-30 LAB — HEPARIN LEVEL (UNFRACTIONATED)
HEPARIN UNFRACTIONATED: 0.2 [IU]/mL — AB (ref 0.30–0.70)
Heparin Unfractionated: 0.12 IU/mL — ABNORMAL LOW (ref 0.30–0.70)
Heparin Unfractionated: 0.19 IU/mL — ABNORMAL LOW (ref 0.30–0.70)
Heparin Unfractionated: 0.27 IU/mL — ABNORMAL LOW (ref 0.30–0.70)

## 2016-06-30 LAB — TSH: TSH: 1.485 u[IU]/mL (ref 0.350–4.500)

## 2016-06-30 LAB — PROTIME-INR
INR: 1.07 (ref 0.00–1.49)
Prothrombin Time: 14.1 seconds (ref 11.6–15.2)

## 2016-06-30 LAB — MAGNESIUM: Magnesium: 1.8 mg/dL (ref 1.7–2.4)

## 2016-06-30 LAB — BRAIN NATRIURETIC PEPTIDE: B Natriuretic Peptide: 120.7 pg/mL — ABNORMAL HIGH (ref 0.0–100.0)

## 2016-06-30 MED ORDER — IOPAMIDOL (ISOVUE-370) INJECTION 76%
INTRAVENOUS | Status: AC
  Administered 2016-06-30: 50 mL
  Filled 2016-06-30: qty 50

## 2016-06-30 MED ORDER — HEPARIN (PORCINE) IN NACL 100-0.45 UNIT/ML-% IJ SOLN
1950.0000 [IU]/h | INTRAMUSCULAR | Status: DC
  Administered 2016-07-01 – 2016-07-05 (×9): 1950 [IU]/h via INTRAVENOUS
  Filled 2016-06-30 (×10): qty 250

## 2016-06-30 NOTE — Progress Notes (Addendum)
hANTICOAGULATION CONSULT NOTE - Initial Consult  Pharmacy Consult for Heparin Indication: chest pain/ACS  No Known Allergies  Patient Measurements: Height: 5\' 7"  (170.2 cm) Weight: 213 lb 13.5 oz (97 kg) IBW/kg (Calculated) : 61.6 Heparin Dosing Weight: 83 kg  Vital Signs: Temp: 98 F (36.7 C) (07/08 0800) Temp Source: Oral (07/08 0800) BP: 152/86 mmHg (07/08 0800) Pulse Rate: 70 (07/08 0443)  Labs:  Recent Labs  06/27/16 1607 06/27/16 1942 06/27/16 2350 06/28/16 0544 06/29/16 0330 06/30/16 0008 06/30/16 0345 06/30/16 0819  HGB 15.6  --   --   --   --   --  12.8  --   HCT 44.4  --   --   --   --   --  39.4  --   PLT 424  --   --   --   --   --  349  --   APTT 33  --   --   --   --   --   --   --   LABPROT 13.8  --   --   --   --   --  14.1  --   INR 1.04  --   --   --   --   --  1.07  --   HEPARINUNFRC  --   --   --   --   --  0.12*  --  0.20*  CREATININE 0.76  --   --  0.87 0.83  --  0.67  --   TROPONINI 0.81* 1.04* 0.83* 0.82*  --   --   --   --     Estimated Creatinine Clearance: 109.6 mL/min (by C-G formula based on Cr of 0.67).   Medical History: Past Medical History  Diagnosis Date  . Malignant hypertension   . HLD (hyperlipidemia)   . Arthralgia of temporomandibular joint   . PTSD (post-traumatic stress disorder)   . Depression   . Fatty liver disease, nonalcoholic [id number]  . Type 2 diabetes mellitus [facility]) January 2017  . Obesity   . Tobacco abuse   . CAD, multiple vessel     a. cath 06/29/16: ostLM 40%, ostLAD 40%, pLAD 95%, ost-pLCx 60%, pLCx 95%, mLCx 60%, mRCA 95%, D2 50%, LVSF nl  . Diastolic dysfunction     a.e cho 06/28/16: EF 50-55%, mild inf wall HK, GR1DD, mild MR, RV sys fxn nl, mildly dilated LA, PASP nl    Medications:  Prescriptions prior to admission  Medication Sig Dispense Refill Last Dose  . aspirin EC 81 MG tablet Take 1 tablet (81 mg total) by mouth daily. 90 tablet 3 Past Week at Unknown time  . carvedilol (COREG) 25 MG tablet  Take 1 tablet (25 mg total) by mouth 2 (two) times daily. 60 tablet 3 Past Week at 1900  . clonazePAM (KLONOPIN) 0.5 MG tablet Take 1 tablet (0.5 mg total) by mouth 3 (three) times daily. 90 tablet 2 Past Week at Unknown time  . enalapril (VASOTEC) 20 MG tablet Take 1 tablet (20 mg total) by mouth daily. 30 tablet 5 Past Week at Unknown time  . FLUoxetine (PROZAC) 40 MG capsule Take 1 capsule (40 mg total) by mouth daily. 30 capsule 2 Past Week at Unknown time  . hydrochlorothiazide (HYDRODIURIL) 25 MG tablet TAKE 1 pill daily (Patient taking differently: Take 25 mg by mouth daily. TAKE 1 pill daily) 30 tablet 2 Past Week at Unknown time  . HYDROcodone-acetaminophen (NORCO) 5-325 MG tablet  Take 1 tablet by mouth every 6 (six) hours as needed for moderate pain. 90 tablet 0 Past Week at Unknown time  . lamoTRIgine (LAMICTAL) 200 MG tablet Take 1 tablet (200 mg total) by mouth daily. 30 tablet 2 Past Week at Unknown time  . metFORMIN (GLUCOPHAGE) 500 MG tablet Take 500 mg by mouth daily with breakfast.   Past Week at Unknown time  . simvastatin (ZOCOR) 20 MG tablet Take 20 mg by mouth daily.   Past Week at Unknown time  . spironolactone (ALDACTONE) 25 MG tablet Take 1 tablet (25 mg total) by mouth daily. 30 tablet 3 Past Week at Unknown time  . verapamil (CALAN) 120 MG tablet Take 1 tablet (120 mg total) by mouth 3 (three) times daily. 90 tablet 3 Past Week at Unknown time  . atorvastatin (LIPITOR) 80 MG tablet Take 1 tablet (80 mg total) by mouth daily at 6 PM. (Patient not taking: Reported on 06/29/2016) 30 tablet 0    Scheduled:  . aspirin EC  81 mg Oral Daily  . atorvastatin  40 mg Oral q1800  . carvedilol  25 mg Oral BID WC  . clonazePAM  0.5 mg Oral TID  . enalapril  20 mg Oral Daily  . FLUoxetine  40 mg Oral Daily  . insulin aspart  0-15 Units Subcutaneous TID WC  . lamoTRIgine  200 mg Oral Daily  . nicotine  21 mg Transdermal Daily  . nitroGLYCERIN  1 inch Topical Q6H  . spironolactone  25  mg Oral Daily  . verapamil  120 mg Oral TID   Infusions:  . heparin 1,350 Units/hr (06/30/16 0043)    Assessment: 45 yo female with history of CAD s/p NSTEMI 06/28/16 with cath revealing severe 3-vessel disease. Planning to continue heparin until patient has CABG next week. Pt is subtherapeutic on heparin, CBC stable.   Goal of Therapy:  Heparin level 0.3-0.7 units/ml Monitor platelets by anticoagulation protocol: Yes    Plan:  -Increase heparin to 1550 units/hr -Daily HL, CBC -Check confirmatory level this afternoon -CABG next week    Hughes Better, PharmD, BCPS Clinical Pharmacist 06/30/2016 9:15 AM     Addendum: Heparin level remains low at 0.19 despite rate increase. RN reports having to turn heparin off for ~ 10 minutes (1350-1400) for CT which may have effected level a little but will increase rate anyway to 1750 units/hr and recheck level in 6 hours.  Nena Jordan, PharmD, BCPS 06/30/2016, 5:16 PM

## 2016-06-30 NOTE — Progress Notes (Signed)
ANTICOAGULATION CONSULT NOTE - Follow Up Consult  Pharmacy Consult for Heparin  Indication: Severe multi-vessel CAD awaiting CABG  No Known Allergies  Patient Measurements: Height: 5\' 7"  (170.2 cm) Weight: 213 lb 13.5 oz (97 kg) IBW/kg (Calculated) : 61.6  Vital Signs: Temp: 98.1 F (36.7 C) (07/08 1700) Temp Source: Oral (07/08 1700) BP: 136/74 mmHg (07/08 2000) Pulse Rate: 70 (07/08 2000)  Labs:  Recent Labs  06/28/16 0544 06/29/16 0330  06/30/16 0345 06/30/16 0819 06/30/16 1552 06/30/16 2315  HGB  --   --   --  12.8  --   --   --   HCT  --   --   --  39.4  --   --   --   PLT  --   --   --  349  --   --   --   LABPROT  --   --   --  14.1  --   --   --   INR  --   --   --  1.07  --   --   --   HEPARINUNFRC  --   --   < >  --  0.20* 0.19* 0.27*  CREATININE 0.87 0.83  --  0.67  --   --   --   TROPONINI 0.82*  --   --   --   --   --   --   < > = values in this interval not displayed.  Estimated Creatinine Clearance: 109.6 mL/min (by C-G formula based on Cr of 0.67).   Assessment: On heparin s/p cath that revealed multi-vessel disease, plans for combined CABG/carotid endarterectomy, HL low tonight  Goal of Therapy:  Heparin level 0.3-0.7 units/ml Monitor platelets by anticoagulation protocol: Yes   Plan:  -Inc heparin to 1950 units/hr -0700 HL  Narda Bonds 06/30/2016,11:55 PM

## 2016-06-30 NOTE — Progress Notes (Signed)
ANTICOAGULATION CONSULT NOTE - Follow Up Consult  Pharmacy Consult for Heparin  Indication: Severe multi-vessel CAD awaiting CABG  No Known Allergies  Patient Measurements: Height: 5\' 7"  (170.2 cm) Weight: 215 lb 13.3 oz (97.9 kg) IBW/kg (Calculated) : 61.6  Vital Signs: Temp: 97.9 F (36.6 C) (07/07 2341) Temp Source: Oral (07/07 2341) BP: 110/59 mmHg (07/07 2341) Pulse Rate: 67 (07/07 2341)  Labs:  Recent Labs  06/27/16 1607 06/27/16 1942 06/27/16 2350 06/28/16 0544 06/29/16 0330 06/30/16 0008  HGB 15.6  --   --   --   --   --   HCT 44.4  --   --   --   --   --   PLT 424  --   --   --   --   --   APTT 33  --   --   --   --   --   LABPROT 13.8  --   --   --   --   --   INR 1.04  --   --   --   --   --   HEPARINUNFRC  --   --   --   --   --  0.12*  CREATININE 0.76  --   --  0.87 0.83  --   TROPONINI 0.81* 1.04* 0.83* 0.82*  --   --     Estimated Creatinine Clearance: 106.1 mL/min (by C-G formula based on Cr of 0.83).   Assessment: On heparin s/p cath that revealed multi-vessel disease, likely CABG after vascular surgery evaluation, initial heparin level is low at 0.12, no issues per RN.  Goal of Therapy:  Heparin level 0.3-0.7 units/ml Monitor platelets by anticoagulation protocol: Yes   Plan:  -Inc heparin to 1350 units/hr -0800 HL  Narda Bonds 06/30/2016,12:40 AM

## 2016-06-30 NOTE — Consult Note (Signed)
Patient name: Erika [name] MRN: [id number] DOB: 1971-05-29 Sex: female       HISTORY OF PRESENT ILLNESS: Patient's a 45 year old the female with a poorly controlled diabetes and hypertension. Very bad family history her father for premature her diabetes and atherosclerotic disease. Was found to have three-vessel coronary disease and has plan for coronary bypass grafting during this admission. Does not have any prior history of amaurosis fugax, transient ischemic attack or stroke. Carotid duplex preop showed critical stenosis in her right internal carotid artery. No severe stenosis in her left internal carotid artery. No history of lower from the peripheral vascular occlusive disease. She recently underwent peripheral arteriogram to rule out renal artery stenosis as cause of hypertension which was negative.  Past Medical History  Diagnosis Date  . Malignant hypertension   . HLD (hyperlipidemia)   . Arthralgia of temporomandibular joint   . PTSD (post-traumatic stress disorder)   . Depression   . Fatty liver disease, nonalcoholic Q000111Q  . Type 2 diabetes mellitus [facility]) January 2017  . Obesity   . Tobacco abuse   . CAD, multiple vessel     a. cath 06/29/16: ostLM 40%, ostLAD 40%, pLAD 95%, ost-pLCx 60%, pLCx 95%, mLCx 60%, mRCA 95%, D2 50%, LVSF nl  . Diastolic dysfunction     a.e cho 06/28/16: EF 50-55%, mild inf wall HK, GR1DD, mild MR, RV sys fxn nl, mildly dilated LA, PASP nl    Past Surgical History  Procedure Laterality Date  . Cholecystectomy    . Tonsillectomy    . Cesarean section    . Peripheral vascular catheterization [name] 04/18/2016    Procedure: Renal Angiography;  Surgeon: Wellington Hampshire, MD;  Location: [place] [facility];  Service: Cardiovascular;  Laterality: [name];  . Cardiac catheterization [name] 06/29/2016    Procedure: Left Heart Cath and Coronary Angiography;  Surgeon: Minna Merritts, MD;  Location: [place] [facility];  Service: Cardiovascular;   Laterality: [name];    Social History   Social History  . Marital Status: Married    Spouse Name: [name]  . Number of Children: [name]  . Years of Education: [name]   Occupational History  . Not on file.   Social History Main Topics  . Smoking status: Current Every Day Smoker -- 0.50 packs/day    Types: Cigarettes  . Smokeless tobacco: Never Used  . Alcohol Use: No  . Drug Use: No  . Sexual Activity: Yes    Birth Control/ Protection: None   Other Topics Concern  . Not on file   Social History Narrative    Family History  Problem Relation Age of Onset  . Adopted: Yes  . Diabetes Mother   . Diabetes Father   . Alcohol abuse Father   . Heart disease Father   . Drug abuse Father   . Stroke Sister   . Anxiety disorder Sister     Allergies as of 06/29/2016  . (No Known Allergies)    No current facility-administered medications on file prior to encounter.   Current Outpatient Prescriptions on File Prior to Encounter  Medication Sig Dispense Refill  . aspirin EC 81 MG tablet Take 1 tablet (81 mg total) by mouth daily. 90 tablet 3  . carvedilol (COREG) 25 MG tablet Take 1 tablet (25 mg total) by mouth 2 (two) times daily. 60 tablet 3  . clonazePAM (KLONOPIN) 0.5 MG tablet Take 1 tablet (0.5 mg total) by mouth 3 (three) times daily.  90 tablet 2  . enalapril (VASOTEC) 20 MG tablet Take 1 tablet (20 mg total) by mouth daily. 30 tablet 5  . FLUoxetine (PROZAC) 40 MG capsule Take 1 capsule (40 mg total) by mouth daily. 30 capsule 2  . hydrochlorothiazide (HYDRODIURIL) 25 MG tablet TAKE 1 pill daily (Patient taking differently: Take 25 mg by mouth daily. TAKE 1 pill daily) 30 tablet 2  . HYDROcodone-acetaminophen (NORCO) 5-325 MG tablet Take 1 tablet by mouth every 6 (six) hours as needed for moderate pain. 90 tablet 0  . lamoTRIgine (LAMICTAL) 200 MG tablet Take 1 tablet (200 mg total) by mouth daily. 30 tablet 2  . spironolactone (ALDACTONE) 25 MG tablet Take 1 tablet (25 mg total) by  mouth daily. 30 tablet 3  . verapamil (CALAN) 120 MG tablet Take 1 tablet (120 mg total) by mouth 3 (three) times daily. 90 tablet 3  . atorvastatin (LIPITOR) 80 MG tablet Take 1 tablet (80 mg total) by mouth daily at 6 PM. (Patient not taking: Reported on 06/29/2016) 30 tablet 0     REVIEW OF SYSTEMS:  Reviewed in her history and physical with nothing to add. No symptoms of peripheral vascular occlusive disease   PHYSICAL EXAMINATION:  General: The patient is a well-nourished female, in no acute distress. Vital signs are BP 124/55 mmHg  Pulse 70  Temp(Src) 98 F (36.7 C) (Oral)  Resp 18  Ht 5\' 7"  (1.702 m)  Wt 213 lb 13.5 oz (97 kg)  BMI 33.49 kg/m2  SpO2 92%  LMP 06/21/2016 Pulmonary: There is a good air exchange bilaterally without wheezing or rales. Abdomen: Soft and non-tender with normal pitch bowel sounds. No abdominal bruits noted Musculoskeletal: There are no major deformities.  There is no significant extremity pain. Neurologic: No focal weakness or paresthesias are detected, Skin: There are no ulcer or rashes noted. Psychiatric: The patient has normal affect. Cardiovascular: There is a regular rate and rhythm without significant murmur appreciated. Carotid arteries without bruits bilaterally 2+ radial and 2+ dorsalis pedis pulses bilaterally  VVS Vascular Lab Studies:  Ordered and Independently Reviewed carotid duplex was reviewed. This shows extreme elevated velocities in her right internal carotid artery with systolic velocities of 99991111 and end-diastolic velocities of 99991111 cm/s  Impression and Plan:  Critical stenosis right internal carotid artery, asymptomatic. Discussed options with the patient at length. Explain the option of coronary bypass with delayed endarterectomy versus endarterectomy and delayed bypass. Also explained our preference for combined carotid coronary surgery for improved overall reduction of risk. Will obtain CT angiogram of her neck for further  visualization of her carotid stenosis. Will be available for combined right carotid endarterectomy and coronary bypass grafting    Casyn Becvar [facility] Office: [phone]

## 2016-06-30 NOTE — Progress Notes (Signed)
PATIENT ID: Erika [name] is a 33F with poorly-controlled DM (A1c 10.4%), poorly-controlled hypertension, ongoing tobacco abuse and non-compliance here with NSTEMI and found to have 3 vessel CAD.  Planned for CABG on 7/14 with Dr. Prescott Gum.  SUBJECTIVE:  Only complaint is headache.  Denies chest pain or shortness of breath.    PHYSICAL EXAM Filed Vitals:   06/29/16 2001 06/29/16 2341 06/30/16 0443 06/30/16 0800  BP: 135/80 110/59 131/74 152/86  Pulse: 77 67 70   Temp: 97.3 F (36.3 C) 97.9 F (36.6 C) 98 F (36.7 C) 98 F (36.7 C)  TempSrc: Oral Oral Oral Oral  Resp: 12 20 18 21   Height:      Weight:   213 lb 13.5 oz (97 kg)   SpO2: 97% 92% 93% 92%   General:  Well-appearing.  No acute distress Neck: No JVD Lungs:  CTAB.  No crackles, rhonchi or wheezes Heart:  RRR.  No m/r/g.  Normal S1/S2 Abdomen:  Soft, NT, ND.  +BS Extremities:  No edema   LABS: Lab Results  Component Value Date   TROPONINI 0.82* 06/28/2016   Results for orders placed or performed during the hospital encounter of 06/29/16 (from the past 24 hour(s))  Surgical pcr screen     Status: None   Collection Time: 06/29/16 11:58 AM  Result Value Ref Range   MRSA, PCR NEGATIVE NEGATIVE   Staphylococcus aureus NEGATIVE NEGATIVE  Urinalysis, Routine w reflex microscopic (not at [facility])     Status: Abnormal   Collection Time: 06/29/16 12:22 PM  Result Value Ref Range   Color, Urine YELLOW YELLOW   APPearance CLEAR CLEAR   Specific Gravity, Urine >1.046 (H) 1.005 - 1.030   pH 5.5 5.0 - 8.0   Glucose, UA NEGATIVE NEGATIVE mg/dL   Hgb urine dipstick MODERATE (A) NEGATIVE   Bilirubin Urine NEGATIVE NEGATIVE   Ketones, ur NEGATIVE NEGATIVE mg/dL   Protein, ur NEGATIVE NEGATIVE mg/dL   Nitrite NEGATIVE NEGATIVE   Leukocytes, UA NEGATIVE NEGATIVE  Urine microscopic-add on     Status: Abnormal   Collection Time: 06/29/16 12:22 PM  Result Value Ref Range   Squamous Epithelial / LPF 0-5 (A) NONE SEEN   WBC, UA  0-5 0 - 5 WBC/hpf   RBC / HPF NONE SEEN 0 - 5 RBC/hpf   Bacteria, UA NONE SEEN NONE SEEN  Glucose, capillary     Status: Abnormal   Collection Time: 06/29/16 12:46 PM  Result Value Ref Range   Glucose-Capillary 113 (H) 65 - 99 mg/dL   Comment 1 Capillary Specimen   Glucose, capillary     Status: Abnormal   Collection Time: 06/29/16  5:07 PM  Result Value Ref Range   Glucose-Capillary 124 (H) 65 - 99 mg/dL   Comment 1 Capillary Specimen   Glucose, capillary     Status: Abnormal   Collection Time: 06/29/16  9:14 PM  Result Value Ref Range   Glucose-Capillary 156 (H) 65 - 99 mg/dL  Heparin level (unfractionated)     Status: Abnormal   Collection Time: 06/30/16 12:08 AM  Result Value Ref Range   Heparin Unfractionated 0.12 (L) 0.30 - 0.70 IU/mL  Comprehensive metabolic panel     Status: Abnormal   Collection Time: 06/30/16  3:45 AM  Result Value Ref Range   Sodium 137 135 - 145 mmol/L   Potassium 3.9 3.5 - 5.1 mmol/L   Chloride 102 101 - 111 mmol/L   CO2 28 22 - 32 mmol/L  Glucose, Bld 147 (H) 65 - 99 mg/dL   BUN 6 6 - 20 mg/dL   Creatinine, Ser 0.67 0.44 - 1.00 mg/dL   Calcium 9.8 8.9 - 10.3 mg/dL   Total Protein 6.3 (L) 6.5 - 8.1 g/dL   Albumin 3.0 (L) 3.5 - 5.0 g/dL   AST 33 15 - 41 U/L   ALT 59 (H) 14 - 54 U/L   Alkaline Phosphatase 82 38 - 126 U/L   Total Bilirubin 0.5 0.3 - 1.2 mg/dL   GFR calc non Af Amer >60 >60 mL/min   GFR calc Af Amer >60 >60 mL/min   Anion gap 7 5 - 15  Magnesium     Status: None   Collection Time: 06/30/16  3:45 AM  Result Value Ref Range   Magnesium 1.8 1.7 - 2.4 mg/dL  TSH     Status: None   Collection Time: 06/30/16  3:45 AM  Result Value Ref Range   TSH 1.485 0.350 - 4.500 uIU/mL  Brain natriuretic peptide     Status: Abnormal   Collection Time: 06/30/16  3:45 AM  Result Value Ref Range   B Natriuretic Peptide 120.7 (H) 0.0 - 100.0 pg/mL  Protime-INR     Status: None   Collection Time: 06/30/16  3:45 AM  Result Value Ref Range    Prothrombin Time 14.1 11.6 - 15.2 seconds   INR 1.07 0.00 - 1.49  CBC     Status: Abnormal   Collection Time: 06/30/16  3:45 AM  Result Value Ref Range   WBC 11.3 (H) 4.0 - 10.5 K/uL   RBC 4.34 3.87 - 5.11 MIL/uL   Hemoglobin 12.8 12.0 - 15.0 g/dL   HCT 39.4 36.0 - 46.0 %   MCV 90.8 78.0 - 100.0 fL   MCH 29.5 26.0 - 34.0 pg   MCHC 32.5 30.0 - 36.0 g/dL   RDW 12.9 11.5 - 15.5 %   Platelets 349 150 - 400 K/uL  Heparin level (unfractionated)     Status: Abnormal   Collection Time: 06/30/16  8:19 AM  Result Value Ref Range   Heparin Unfractionated 0.20 (L) 0.30 - 0.70 IU/mL  Glucose, capillary     Status: Abnormal   Collection Time: 06/30/16  8:33 AM  Result Value Ref Range   Glucose-Capillary 177 (H) 65 - 99 mg/dL   Comment 1 Capillary Specimen     Intake/Output Summary (Last 24 hours) at 06/30/16 [id number] Last data filed at 06/30/16 0600  Gross per 24 hour  Intake 516.05 ml  Output   1950 ml  Net -1433.95 ml    Telemetry:  No events  Most Recent Cardiac Studies: Cardiac catheterization 19-Jul-2016: Coronary Findings    Dominance: Co-dominant   Left Main   . Ost LM to LM lesion, 40% stenosed.     Left Anterior Descending   . Ost LAD lesion, 40% stenosed.   . Prox LAD lesion, 95% stenosed.   . Second Diagonal Branch   . 2nd Diag lesion, 50% stenosed.     Left Circumflex   . Ost Cx to Prox Cx lesion, 60% stenosed. Severely Calcified diffuse.   . Prox Cx lesion, 95% stenosed.   . Mid Cx lesion, 60% stenosed.     Right Coronary Artery   . Mid RCA lesion, 95% stenosed.      Wall Motion                 Left Heart    Left Ventricle The  left ventricular size is normal. The left ventricular systolic function is normal. There are wall motion abnormalities in the left ventricle. There are segmental wall motion abnormalities in the left ventricle. Mild inferior wall hypokinesis   Mitral Valve There is no  mitral valve stenosis, no mitral valve regurgitation and no mitral valve prolapse evident. The annulus is normal.   Aortic Valve There is no aortic valve stenosis. No calcification found in the aortic valve. There is normal aortic valve motion.   Aorta The size of the ascending aorta is normal.    Coronary Diagrams    Diagnostic Diagram         Case discussed with Dr. Fletcher Anon. Initially was unclear if left main was severe and degree of stenosis of the mid LAD was not appreciated. Dr. Fletcher Anon gave intercoronary nitroglycerin which clearly documented mid LAD lesion .   Recommendations:   Plan is to transfer to [facility] for consideration of bypass surgery. She will need medical risk factor modification. Poor control diabetes, hyperlipidemia, smoker. Morbid obesity Needs significant lifestyle changes  Echo 06/28/2016: Study Conclusions - Left ventricle: The cavity size was normal. There was mild  concentric hypertrophy. Systolic function was normal. The  estimated ejection fraction was in the range of 50% to 55%.  Select image suggestive of mild inferior wall hypokinesis.  Doppler parameters are consistent with abnormal left ventricular  relaxation (grade 1 diastolic dysfunction). - Mitral valve: There was mild regurgitation. - Left atrium: The atrium was normal in size. - Right ventricle: Systolic function was normal. - Pulmonary arteries: Systolic pressure was within the normal  range.         ASSESSMENT AND PLAN:  Principal Problem:   NSTEMI (non-ST elevated myocardial infarction) ([place]) Active Problems:   HLD (hyperlipidemia)   ADJUSTMENT DISORDER WITH MIXED FEATURES   HTN (hypertension), malignant   Agoraphobia with panic attacks   MDD (major depressive disorder) (HCC)   Tobacco abuse   Uncontrolled type 2 diabetes mellitus with complication, without long-term current use of insulin (HCC)   CAD in native artery   Metabolic syndrome   # 3  Vessel CAD: # NSTEMI: Scheduled for CABG on 07/06/16.  Currently chest pain free.  Continue aspirin, carvedilol, and atorvastatin.  This is at least in part due to poor glycemic control.    # Hypertensive heart disease: Blood pressure is much better-controlled since admission.  Poor outpatient control likely due to non-compliance.  Continue carvedilol, enalapril, spironolactone and verapamil.  Her home HCTZ was held on admission.  Continue heparin and nitro paste.  If headache does not resolve with acetaminophen/morphine, will d/c nitro.  # Hyperlipidemia:  Triglycerides were 436 this admission.  Unable to calculate LDL.  Simvastatin was switched to atorvastatin this admission.  Will increase dose to 80 mg daily.    # Diabetes mellitus type 2: Very poorly-controlled.  Hemoglobin A1c is 10.4%.  Home metformin has been held.  Accuchecks are <200.  Continue SSI.  # Tobacco abuse: Nicotine patch.  Continued to encourage smoking cessation.   # Depression, PTSD, panic disorder: Continue Prozac, Klonopin and Lamictal.   Chevy Sweigert C. Oval Linsey, MD, [facility] 06/30/2016 9:22 AM

## 2016-06-30 NOTE — Progress Notes (Signed)
Pt refused pm Verapamil with SBP in 120's. Explained purpose of BP meds was to maintain control over a period of time and that a SBP of 120's was not considered "low". Pt states that her normal BP is 190's and if we dropped her pressure any lower she would "have a hard time getting up". Educated about need to reduce workload on her heart. She states she understands, but feels that taking verapamil would be detrimental. Verapamil held.

## 2016-07-01 DIAGNOSIS — I2510 Atherosclerotic heart disease of native coronary artery without angina pectoris: Secondary | ICD-10-CM

## 2016-07-01 LAB — GLUCOSE, CAPILLARY
GLUCOSE-CAPILLARY: 157 mg/dL — AB (ref 65–99)
Glucose-Capillary: 236 mg/dL — ABNORMAL HIGH (ref 65–99)
Glucose-Capillary: 115 mg/dL — ABNORMAL HIGH (ref 65–99)
Glucose-Capillary: 173 mg/dL — ABNORMAL HIGH (ref 65–99)

## 2016-07-01 LAB — CBC
HCT: 40.6 % (ref 36.0–46.0)
Hemoglobin: 13.2 g/dL (ref 12.0–15.0)
MCH: 29.4 pg (ref 26.0–34.0)
MCHC: 32.5 g/dL (ref 30.0–36.0)
MCV: 90.4 fL (ref 78.0–100.0)
PLATELETS: 358 10*3/uL (ref 150–400)
RBC: 4.49 MIL/uL (ref 3.87–5.11)
RDW: 12.8 % (ref 11.5–15.5)
WBC: 13.7 10*3/uL — AB (ref 4.0–10.5)

## 2016-07-01 LAB — HEPARIN LEVEL (UNFRACTIONATED): Heparin Unfractionated: 0.5 IU/mL (ref 0.30–0.70)

## 2016-07-01 MED ORDER — HYDROCODONE-ACETAMINOPHEN 5-325 MG PO TABS
2.0000 | ORAL_TABLET | Freq: Four times a day (QID) | ORAL | Status: DC | PRN
  Administered 2016-07-01 – 2016-07-05 (×14): 2 via ORAL
  Filled 2016-07-01 (×14): qty 2

## 2016-07-01 MED ORDER — ISOSORBIDE MONONITRATE ER 30 MG PO TB24
30.0000 mg | ORAL_TABLET | Freq: Every day | ORAL | Status: DC
  Administered 2016-07-01 – 2016-07-05 (×5): 30 mg via ORAL
  Filled 2016-07-01 (×5): qty 1

## 2016-07-01 MED ORDER — ATORVASTATIN CALCIUM 80 MG PO TABS
80.0000 mg | ORAL_TABLET | Freq: Every day | ORAL | Status: DC
  Administered 2016-07-01 – 2016-07-07 (×6): 80 mg via ORAL
  Filled 2016-07-01 (×2): qty 1
  Filled 2016-07-01: qty 2
  Filled 2016-07-01 (×3): qty 1

## 2016-07-01 NOTE — Progress Notes (Signed)
PATIENT ID: Erika [name] is a 25F with poorly-controlled DM (A1c 10.4%), poorly-controlled hypertension, ongoing tobacco abuse and non-compliance here with NSTEMI and found to have 3 vessel CAD.  Planned for CABG on 7/14 with Dr. Prescott Gum.  SUBJECTIVE:  Feels well.  She had some shortness of breath walking to the bathroom.    PHYSICAL EXAM Filed Vitals:   07/01/16 0300 07/01/16 0400 07/01/16 0500 07/01/16 0803  BP: 124/71 131/72  130/79  Pulse:  71  61  Temp:  98.1 F (36.7 C)  98 F (36.7 C)  TempSrc:  Oral  Oral  Resp:  18    Height:      Weight:   212 lb (96.163 kg)   SpO2:  98%  93%   General:  Well-appearing.  No acute distress Neck: No JVD Lungs:  CTAB.  No crackles, rhonchi or wheezes Heart:  RRR.  No m/r/g.  Normal S1/S2 Abdomen:  Soft, NT, ND.  +BS Extremities:  No edema   LABS: Lab Results  Component Value Date   TROPONINI 0.82* 06/28/2016   Results for orders placed or performed during the hospital encounter of 06/29/16 (from the past 24 hour(s))  Glucose, capillary     Status: Abnormal   Collection Time: 06/30/16 12:22 PM  Result Value Ref Range   Glucose-Capillary 154 (H) 65 - 99 mg/dL   Comment 1 Capillary Specimen   Heparin level (unfractionated)     Status: Abnormal   Collection Time: 06/30/16  3:52 PM  Result Value Ref Range   Heparin Unfractionated 0.19 (L) 0.30 - 0.70 IU/mL  Glucose, capillary     Status: Abnormal   Collection Time: 06/30/16  5:05 PM  Result Value Ref Range   Glucose-Capillary 126 (H) 65 - 99 mg/dL   Comment 1 Capillary Specimen   Glucose, capillary     Status: Abnormal   Collection Time: 06/30/16  9:17 PM  Result Value Ref Range   Glucose-Capillary 166 (H) 65 - 99 mg/dL   Comment 1 Capillary Specimen   Heparin level (unfractionated)     Status: Abnormal   Collection Time: 06/30/16 11:15 PM  Result Value Ref Range   Heparin Unfractionated 0.27 (L) 0.30 - 0.70 IU/mL  CBC     Status: Abnormal   Collection Time: 07/01/16   6:49 AM  Result Value Ref Range   WBC 13.7 (H) 4.0 - 10.5 K/uL   RBC 4.49 3.87 - 5.11 MIL/uL   Hemoglobin 13.2 12.0 - 15.0 g/dL   HCT 40.6 36.0 - 46.0 %   MCV 90.4 78.0 - 100.0 fL   MCH 29.4 26.0 - 34.0 pg   MCHC 32.5 30.0 - 36.0 g/dL   RDW 12.8 11.5 - 15.5 %   Platelets 358 150 - 400 K/uL  Heparin level (unfractionated)     Status: None   Collection Time: 07/01/16  6:49 AM  Result Value Ref Range   Heparin Unfractionated 0.50 0.30 - 0.70 IU/mL  Glucose, capillary     Status: Abnormal   Collection Time: 07/01/16  8:00 AM  Result Value Ref Range   Glucose-Capillary 157 (H) 65 - 99 mg/dL   Comment 1 Capillary Specimen     Intake/Output Summary (Last 24 hours) at 07/01/16 [id number] Last data filed at 07/01/16 0900  Gross per 24 hour  Intake 416.26 ml  Output      0 ml  Net 416.26 ml    Telemetry:  No events  Most Recent Cardiac Studies: Cardiac catheterization  06/29/2016: Coronary Findings    Dominance: Co-dominant   Left Main   . Ost LM to LM lesion, 40% stenosed.     Left Anterior Descending   . Ost LAD lesion, 40% stenosed.   . Prox LAD lesion, 95% stenosed.   . Second Diagonal Branch   . 2nd Diag lesion, 50% stenosed.     Left Circumflex   . Ost Cx to Prox Cx lesion, 60% stenosed. Severely Calcified diffuse.   . Prox Cx lesion, 95% stenosed.   . Mid Cx lesion, 60% stenosed.     Right Coronary Artery   . Mid RCA lesion, 95% stenosed.      Wall Motion                 Left Heart    Left Ventricle The left ventricular size is normal. The left ventricular systolic function is normal. There are wall motion abnormalities in the left ventricle. There are segmental wall motion abnormalities in the left ventricle. Mild inferior wall hypokinesis   Mitral Valve There is no mitral valve stenosis, no mitral valve regurgitation and no mitral valve prolapse evident. The annulus is normal.   Aortic  Valve There is no aortic valve stenosis. No calcification found in the aortic valve. There is normal aortic valve motion.   Aorta The size of the ascending aorta is normal.    Coronary Diagrams    Diagnostic Diagram         Case discussed with Dr. Fletcher Anon. Initially was unclear if left main was severe and degree of stenosis of the mid LAD was not appreciated. Dr. Fletcher Anon gave intercoronary nitroglycerin which clearly documented mid LAD lesion .   Recommendations:   Plan is to transfer to [facility] for consideration of bypass surgery. She will need medical risk factor modification. Poor control diabetes, hyperlipidemia, smoker. Morbid obesity Needs significant lifestyle changes  Echo 06/28/2016: Study Conclusions - Left ventricle: The cavity size was normal. There was mild  concentric hypertrophy. Systolic function was normal. The  estimated ejection fraction was in the range of 50% to 55%.  Select image suggestive of mild inferior wall hypokinesis.  Doppler parameters are consistent with abnormal left ventricular  relaxation (grade 1 diastolic dysfunction). - Mitral valve: There was mild regurgitation. - Left atrium: The atrium was normal in size. - Right ventricle: Systolic function was normal. - Pulmonary arteries: Systolic pressure was within the normal  range.         ASSESSMENT AND PLAN:  Principal Problem:   NSTEMI (non-ST elevated myocardial infarction) ([place]) Active Problems:   HLD (hyperlipidemia)   ADJUSTMENT DISORDER WITH MIXED FEATURES   HTN (hypertension), malignant   Agoraphobia with panic attacks   MDD (major depressive disorder) (HCC)   Tobacco abuse   Uncontrolled type 2 diabetes mellitus with complication, without long-term current use of insulin (HCC)   CAD in native artery   Metabolic syndrome   # 3 Vessel CAD: # NSTEMI: # R Carotid stenosis: Scheduled for CABG and R carotid endarterectomy on 07/06/16.  Currently chest pain  free.  Continue heparin, aspirin, carvedilol, and atorvastatin.  Will switch nitro paste to Imdur 30 mg daily.  Encouraged ambulation as long as she is not having chest pain so that she does not get deconditioned pre-op.  # Hypertensive heart disease: Blood pressure is much better-controlled since admission.  Poor outpatient control likely due to non-compliance.  Continue carvedilol, enalapril, spironolactone and verapamil.  Her home HCTZ was held on admission.    #  Hyperlipidemia:  Triglycerides were 436 this admission.  This is at least in part due to poor glycemic control.   Unable to calculate LDL.  Simvastatin was switched to atorvastatin this admission.    # Diabetes mellitus type 2: Very poorly-controlled.  Hemoglobin A1c is 10.4%.  Home metformin has been held.  Accuchecks are <200.  Continue SSI.  # Tobacco abuse: Nicotine patch.  Continued to encourage smoking cessation.   # Depression, PTSD, panic disorder: Continue Prozac, Klonopin and Lamictal.   Will transfer to step down status.   Kaileena Obi C. Oval Linsey, MD, [facility] 07/01/2016 9:27 AM

## 2016-07-01 NOTE — Progress Notes (Signed)
ANTICOAGULATION CONSULT NOTE - Follow Up Consult  Pharmacy Consult for Heparin  Indication: Severe multi-vessel CAD awaiting CABG  No Known Allergies  Patient Measurements: Height: 5\' 7"  (170.2 cm) Weight: 212 lb (96.163 kg) IBW/kg (Calculated) : 61.6  Vital Signs: Temp: 98 F (36.7 C) (07/09 0803) Temp Source: Oral (07/09 0803) BP: 130/79 mmHg (07/09 0803) Pulse Rate: 61 (07/09 0803)  Labs:  Recent Labs  06/29/16 0330  06/30/16 0345  06/30/16 1552 06/30/16 2315 07/01/16 0649  HGB  --   --  12.8  --   --   --  13.2  HCT  --   --  39.4  --   --   --  40.6  PLT  --   --  349  --   --   --  358  LABPROT  --   --  14.1  --   --   --   --   INR  --   --  1.07  --   --   --   --   HEPARINUNFRC  --   < >  --   < > 0.19* 0.27* 0.50  CREATININE 0.83  --  0.67  --   --   --   --   < > = values in this interval not displayed.  Estimated Creatinine Clearance: 109 mL/min (by C-G formula based on Cr of 0.67).  Assessment: Erika [name] is 45 yo with significant CAD and is awaiting CABG.  She was started on IV heparin s/p cath that revealed multi-vessel disease.  Plans for combined CABG/carotid endarterectomy later this week. HL today is within desired goal range at 0.5IU/ml.  Her CBC remains stable and she is without noted bleeding complications.  Goal of Therapy:  Heparin level 0.3-0.7 units/ml Monitor platelets by anticoagulation protocol: Yes   Plan:  -Continue IV heparin at 1950 units/hr -F/U AM HL/CBC -Monitor for s/s of bleeding   Rober Minion, PharmD., MS Clinical Pharmacist Pager:  [phone] Thank you for allowing pharmacy to be part of this patients care team. 07/01/2016,9:01 AM

## 2016-07-01 NOTE — Progress Notes (Signed)
Subjective: Interval History: none.. Eating breakfast currently. No new episodes of chest pain. No neurologic deficits  Objective: Vital signs in last 24 hours: Temp:  [98 F (36.7 C)-98.3 F (36.8 C)] 98 F (36.7 C) (07/09 0803) Pulse Rate:  [61-71] 61 (07/09 0803) Resp:  [16-20] 18 (07/09 0400) BP: (105-159)/(55-79) 130/79 mmHg (07/09 0803) SpO2:  [91 %-98 %] 93 % (07/09 0803) Weight:  [212 lb (96.163 kg)] 212 lb (96.163 kg) (07/09 0500)  Intake/Output from previous day: 07/08 0701 - 07/09 0700 In: 359.3 [I.V.:359.3] Out: -  Intake/Output this shift:    Unchanged. Neurologically intact  Lab Results:  Recent Labs  06/30/16 0345 07/01/16 0649  WBC 11.3* 13.7*  HGB 12.8 13.2  HCT 39.4 40.6  PLT 349 358   BMET  Recent Labs  06/29/16 0330 06/30/16 0345  NA 134* 137  K 4.3 3.9  CL 100* 102  CO2 26 28  GLUCOSE 246* 147*  BUN 14 6  CREATININE 0.83 0.67  CALCIUM 9.7 9.8    Studies/Results: Dg Chest 2 View  06/27/2016  CLINICAL DATA:  Severe back/chest pain EXAM: CHEST  2 VIEW COMPARISON:  11/30/2015 FINDINGS: Lungs are clear.  No pleural effusion or pneumothorax. The heart is normal in size. Visualized osseous structures are within normal limits. IMPRESSION: Normal chest radiographs. Electronically Signed   By: Julian Hy M.D.   On: 06/27/2016 12:27   Ct Angio Neck W Or Wo Contrast  06/30/2016  CLINICAL DATA:  45 year old female with poorly controlled diabetes and hypertension. Three vessel coronary disease with planned for CABG carotid ultrasound suggesting critical stenosis in the right ICA. Initial encounter. EXAM: CT ANGIOGRAPHY NECK TECHNIQUE: Multidetector CT imaging of the neck was performed using the standard protocol during bolus administration of intravenous contrast. Multiplanar CT image reconstructions and MIPs were obtained to evaluate the vascular anatomy. Carotid stenosis measurements (when applicable) are obtained utilizing NASCET criteria, using  the distal internal carotid diameter as the denominator. CONTRAST:  50 mL Isovue 370 COMPARISON:  [facility] Head CT without contrast 01/11/2014, and earlier FINDINGS: Skeleton: Intermittent poor dentition. Straightening of cervical lordosis. No acute osseous abnormality identified. Visualized paranasal sinuses and mastoids are well pneumatized. Other neck: Negative lung apices. No superior mediastinal lymphadenopathy. Negative thyroid, larynx, pharynx, parapharyngeal spaces, retropharyngeal space, sublingual space, submandibular glands, and parotid glands. No cervical lymphadenopathy. Aortic arch: 4 vessel arch configuration, the left vertebral artery arises directly off the arch but is heavily diseased as detailed below. Calcified arch atherosclerosis. Right carotid system: Soft plaque in the right brachiocephalic artery and right CCA origin without hemodynamically significant stenosis. Somewhat bulky soft plaque along the ventral right CCA (series 401, image 41) without hemodynamically significant stenosis. Bulky mostly soft plaque occupying the right ICA origin and bulb resulting in high-grade radiographic string sign stenosis over a segment of about 8 mm as seen on series 401, image 60, series 404, image 72, and series 406, image 13). Despite this the right ICA remains patent, and appears otherwise negative to the skullbase. The visible right ICA siphon is patent and negative. Left carotid system: Soft plaque at the left CCA origin with less than 50% stenosis with respect to the distal vessel. Circumferential soft plaque in the left CCA proximal to the bifurcation without stenosis. Soft and calcified plaque at the left carotid bifurcation and involving the left ICA origin and bulb, but no subsequent stenosis. Negative cervical left ICA otherwise. Visible left ICA siphon is patent but with moderate calcified  plaque (series 401, image 102). Vertebral arteries: Soft plaque at the right  subclavian artery origin resulting in up to 50% stenosis. Right vertebral artery origin is mildly stenotic due to soft plaque. The right vertebral artery is dominant. There is a mild stenosis in the distal V1 segment as seen on series 403, image 134. Otherwise the right vertebral artery is normal to the skullbase. Intracranially however there is severe stenosis of the right V4 segment at the PICA origin (series 401, image 85). Despite this the distal right vertebral remains patent to the junction with the basilar. The visible basilar artery is negative. The left vertebral artery arises directly from the arch, but is severely diseased and appears occluded in the V1 segment. Flow is reconstituted in the left V2 segment beginning at the C5 level, but the left vertebral artery is diminutive until the C2 level. There is then mild irregularity and stenosis in the left V3 segment, and moderate to severe stenosis in the proximal left V4 segment (series 402, image 164. There is calcified plaque near the left PICA origin which remains patent. There is mild to moderate irregularity and mild stenosis at the left vertebrobasilar junction. IMPRESSION: 1. Very age advanced atherosclerosis. Bulky soft plaque resulting in RADIOGRAPHIC STRING SIGN stenosis of the right ICA bulb over a segment of about 8 mm. 2. Widespread left carotid atherosclerosis but no hemodynamically significant stenosis. Left ICA siphon calcified plaque partially visible. 3. Significant posterior circulation atherosclerosis and stenosis: The left vertebral artery arises directly from the arch, but its origin and the left V1 segment are occluded. Flow is reconstituted in the left V2 segment. Mild right vertebral artery origin stenosis, but extensive bilateral V4 vertebral atherosclerosis with moderate severe stenosis. Despite this the vertebrobasilar junction remains patent. Electronically Signed   By: Genevie Ann M.D.   On: 06/30/2016 15:27   Ct Angio  Chest/abd/pel For Dissection W And/or W/wo  06/28/2016  EXAM: CT ANGIOGRAPHY CHEST, ABDOMEN, PELVIS WITH CONTRAST TECHNIQUE: Multidetector CT imaging of the chest, abdomen, pelvis was performed using the standard protocol during bolus administration of intravenous contrast. Multiplanar CT image reconstructions and MIPs were obtained to evaluate the vascular anatomy. CONTRAST:  100 mL Isovue 370 IV COMPARISON:  05/28/2009 FINDINGS: CHEST Vascular: Left arm IV contrast administration. Innominate vein and SVC are patent. Right atrium and right ventricle are nondilated. Satisfactory opacification of pulmonary arteries noted, and there is no evidence of pulmonary emboli. Patent bilateral pulmonary veins drain into the left atrium. Scattered coronary calcifications most evident on the noncontrast scan. Adequate contrast opacification of the thoracic aorta with no evidence of dissection, aneurysm, or stenosis. There is patent brachiocephalic arch anatomy without proximal stenosis. Partially calcified atheromatous plaque in the aortic arch and descending segment. Mediastinum/Lymph Nodes: No masses or pathologically enlarged lymph nodes identified. No pericardial effusion. Lungs/Pleura: 40mm noncalcified nodule, superior segment right lower lobe image 20/3. Subsegmental dependent atelectasis or scarring posteriorly in both lower lobes. No pleural effusion. No pneumothorax. Musculoskeletal: No chest wall mass or suspicious bone lesions identified. ABDOMEN Arterial Aorta: Moderate atheromatous plaque particularly in the infrarenal segment, without aneurysm, dissection, or stenosis. Celiac axis:          Patent Superior mesenteric:  Patent Left renal: Minimal ostial plaque, nonocclusive. Patent distally. Right renal:          Patent Inferior mesenteric:  Patent Left iliac: Partially calcified plaque through the common and internal iliac arteries without high-grade stenosis, aneurysm or dissection. External iliac patent. Common  femoral  patent. Right iliac: Mild common iliac and proximal internal iliac plaque. No aneurysm, dissection, or high-grade stenosis. Venous Dedicated venous phase imaging not obtained. Patent bilateral renal veins, portal vein, splenic vein. Review of the MIP images confirms the above findings. Nonvasular Hepatobiliary: No masses or other significant abnormality. Pancreas: No mass, inflammatory changes, or other significant abnormality. Spleen: Within normal limits in size and appearance. Adrenals/Urinary Tract: No masses identified. No evidence of hydronephrosis. Stomach/Bowel: No evidence of obstruction, inflammatory process, or abnormal fluid collections. Normal appendix. Scattered diverticula, most numerous in the distal descending segment, without significant adjacent inflammatory/edematous change. Lymphatic: No pathologically enlarged lymph nodes. Reproductive: No mass or other significant abnormality. Other: No ascites.  No free air. Musculoskeletal: Obesity. Stable discogenic sclerosis adjacent to the inferior plate of L4. Negative for fracture or other acute bone lesion. Subcutaneous foci of gas and fluid in the anterior right lower quadrant and left lower quadrant body wall presumably related to recent injections. Tiny umbilical hernia containing only mesenteric fat. IMPRESSION: 1. Negative for aortic dissection or pulmonary emboli. 2. 6 mm nonspecific nodule, superior segment right lower lobe. Non-contrast chest CT at 6-12 months is recommended. If the nodule is stable at time of repeat CT, then future CT at 18-24 months (from today's scan) is considered optional for low-risk patients, but is recommended for high-risk patients. This recommendation follows the consensus statement: Guidelines for Management of Incidental Pulmonary Nodules Detected on CT Images:From the Fleischner Society 2017; published online before print (10.1148/radiol.IJ:2314499). 3. Atherosclerosis, including Aortic Atherosclerosis  (ICD10-170.0), iliac and coronary artery disease. Please note that although the presence of coronary artery calcium documents the presence of coronary artery disease, the severity of this disease and any potential stenosis cannot be assessed on this non-gated CT examination. Assessment for potential risk factor modification, dietary therapy or pharmacologic therapy may be warranted, if clinically indicated. 4. Colonic diverticulosis. Electronically Signed   By: Lucrezia Europe M.D.   On: 06/28/2016 12:40   Anti-infectives: Anti-infectives    None      Assessment/Plan: s/p Procedure(s): CORONARY ARTERY BYPASS GRAFTING (CABG) ([name]) TRANSESOPHAGEAL ECHOCARDIOGRAM (TEE) ([name]) Discussed her CT angiogram of her neck with the patient. This shows radiologic string sign with trickle flow in her right proximal internal carotid artery. The common carotid and internal carotid distal this are of normal caliber. She does have atherosclerotic changes but no flow-limiting stenosis in her left carotid system. She does have extensive vertebral disease as well which is asymptomatic. Again discussed plan for combined right carotid endarterectomy and coronary artery bypass grafting later next week. Will follow with you.   LOS: 2 days   Arbor Cohen 07/01/2016, 8:25 AM

## 2016-07-02 ENCOUNTER — Inpatient Hospital Stay (HOSPITAL_COMMUNITY): Payer: BLUE CROSS/BLUE SHIELD

## 2016-07-02 ENCOUNTER — Encounter (HOSPITAL_COMMUNITY): Payer: Self-pay | Admitting: *Deleted

## 2016-07-02 DIAGNOSIS — R001 Bradycardia, unspecified: Secondary | ICD-10-CM

## 2016-07-02 DIAGNOSIS — I2510 Atherosclerotic heart disease of native coronary artery without angina pectoris: Secondary | ICD-10-CM | POA: Insufficient documentation

## 2016-07-02 DIAGNOSIS — I6523 Occlusion and stenosis of bilateral carotid arteries: Secondary | ICD-10-CM

## 2016-07-02 DIAGNOSIS — I6529 Occlusion and stenosis of unspecified carotid artery: Secondary | ICD-10-CM | POA: Insufficient documentation

## 2016-07-02 LAB — GLUCOSE, CAPILLARY
GLUCOSE-CAPILLARY: 151 mg/dL — AB (ref 65–99)
GLUCOSE-CAPILLARY: 191 mg/dL — AB (ref 65–99)
GLUCOSE-CAPILLARY: 129 mg/dL — AB (ref 65–99)
Glucose-Capillary: 144 mg/dL — ABNORMAL HIGH (ref 65–99)

## 2016-07-02 LAB — SPIROMETRY WITH GRAPH
FEF 25-75 Post: 2.22 L/sec
FEF 25-75 Pre: 1.54 L/sec
FEF2575-%Change-Post: 44 %
FEF2575-%Pred-Post: 69 %
FEF2575-%Pred-Pre: 47 %
FEV1-%Change-Post: 12 %
FEV1-%Pred-Post: 58 %
FEV1-%Pred-Pre: 52 %
FEV1-Post: 1.91 L
FEV1-Pre: 1.7 L
FEV1FVC-%Change-Post: 4 %
FEV1FVC-%Pred-Pre: 94 %
FEV6-%Change-Post: 8 %
FEV6-%Pred-Post: 60 %
FEV6-%Pred-Pre: 55 %
FEV6-Post: 2.38 L
FEV6-Pre: 2.2 L
FEV6FVC-%Pred-Post: 102 %
FEV6FVC-%Pred-Pre: 102 %
FVC-%Change-Post: 8 %
FVC-%Pred-Post: 58 %
FVC-%Pred-Pre: 54 %
FVC-Post: 2.38 L
FVC-Pre: 2.2 L
Post FEV1/FVC ratio: 80 %
Post FEV6/FVC ratio: 100 %
Pre FEV1/FVC ratio: 77 %
Pre FEV6/FVC Ratio: 100 %

## 2016-07-02 LAB — CBC
HCT: 37.4 % (ref 36.0–46.0)
Hemoglobin: 12.3 g/dL (ref 12.0–15.0)
MCH: 30 pg (ref 26.0–34.0)
MCHC: 32.9 g/dL (ref 30.0–36.0)
MCV: 91.2 fL (ref 78.0–100.0)
PLATELETS: 345 10*3/uL (ref 150–400)
RBC: 4.1 MIL/uL (ref 3.87–5.11)
RDW: 13 % (ref 11.5–15.5)
WBC: 12.9 10*3/uL — ABNORMAL HIGH (ref 4.0–10.5)

## 2016-07-02 LAB — HEPARIN LEVEL (UNFRACTIONATED): Heparin Unfractionated: 0.53 IU/mL (ref 0.30–0.70)

## 2016-07-02 MED ORDER — VERAPAMIL HCL 40 MG PO TABS
40.0000 mg | ORAL_TABLET | Freq: Three times a day (TID) | ORAL | Status: DC
  Administered 2016-07-02 (×2): 40 mg via ORAL
  Filled 2016-07-02 (×4): qty 1

## 2016-07-02 MED ORDER — ALUM & MAG HYDROXIDE-SIMETH 200-200-20 MG/5ML PO SUSP
30.0000 mL | Freq: Four times a day (QID) | ORAL | Status: DC | PRN
  Administered 2016-07-02 – 2016-07-05 (×2): 30 mL via ORAL
  Filled 2016-07-02 (×2): qty 30

## 2016-07-02 MED ORDER — ALBUTEROL SULFATE (2.5 MG/3ML) 0.083% IN NEBU
2.5000 mg | INHALATION_SOLUTION | Freq: Once | RESPIRATORY_TRACT | Status: AC
  Administered 2016-07-02: 2.5 mg via RESPIRATORY_TRACT

## 2016-07-02 NOTE — Progress Notes (Signed)
Inpatient Diabetes Program Recommendations  AACE/ADA: New Consensus Statement on Inpatient Glycemic Control (2015)  Target Ranges:  Prepandial:   less than 140 mg/dL      Peak postprandial:   less than 180 mg/dL (1-2 hours)      Critically ill patients:  140 - 180 mg/dL   Lab Results  Component Value Date   GLUCAP 151* 07/02/2016   HGBA1C 10.4* 06/28/2016    Review of Glycemic Control:  Results for Erika [name], Erika [name] (MRN [id number]) as of 07/02/2016 09:23  Ref. Range 07/01/2016 08:00 07/01/2016 11:46 07/01/2016 16:43 07/01/2016 21:55 07/02/2016 08:49  Glucose-Capillary Latest Ref Range: 65-99 mg/dL 157 (H) 236 (H) 115 (H) 173 (H) 151 (H)   Diabetes history: Type 2 diabetes Outpatient Diabetes medications: Metformin 500 mg q AM Current orders for Inpatient glycemic control:  Novolog moderate tid with meals   Inpatient Diabetes Program Recommendations:    Please consider adding basal insulin.  Consider adding Lantus 18 units daily.    Thanks, Adah Perl, RN, BC-ADM Inpatient Diabetes Coordinator Pager [phone] (8a-5p)

## 2016-07-02 NOTE — Progress Notes (Signed)
PATIENT ID: Erika [name] is a 65F with poorly-controlled DM (A1c 10.4%), poorly-controlled hypertension, ongoing tobacco abuse and non-compliance here with NSTEMI and found to have 3 vessel CAD.  Planned for CABG on 7/14 with Dr. Prescott Gum.  SUBJECTIVE:  Feels well.  Has cluster headaches that respond to percocet.  No chest pain or shortness of breath with ambulation yesterday.    PHYSICAL EXAM Filed Vitals:   07/02/16 0507 07/02/16 0600 07/02/16 0700 07/02/16 0800  BP: 120/79 104/74 118/71 90/63  Pulse:    54  Temp:    97.7 F (36.5 C)  TempSrc:    Oral  Resp:    18  Height:      Weight:      SpO2:    96%   General:  Well-appearing.  No acute distress Neck: No JVD Lungs:  CTAB.  No crackles, rhonchi or wheezes Heart:  RRR.  No m/r/g.  Normal S1/S2 Abdomen:  Soft, NT, ND.  +BS Extremities:  No edema   LABS: Lab Results  Component Value Date   TROPONINI 0.82* 06/28/2016   Results for orders placed or performed during the hospital encounter of July 09, 2016 (from the past 24 hour(s))  Glucose, capillary     Status: Abnormal   Collection Time: 07/01/16 11:46 AM  Result Value Ref Range   Glucose-Capillary 236 (H) 65 - 99 mg/dL   Comment 1 Capillary Specimen   Glucose, capillary     Status: Abnormal   Collection Time: 07/01/16  4:43 PM  Result Value Ref Range   Glucose-Capillary 115 (H) 65 - 99 mg/dL  Glucose, capillary     Status: Abnormal   Collection Time: 07/01/16  9:55 PM  Result Value Ref Range   Glucose-Capillary 173 (H) 65 - 99 mg/dL   Comment 1 Capillary Specimen   CBC     Status: Abnormal   Collection Time: 07/02/16  4:04 AM  Result Value Ref Range   WBC 12.9 (H) 4.0 - 10.5 K/uL   RBC 4.10 3.87 - 5.11 MIL/uL   Hemoglobin 12.3 12.0 - 15.0 g/dL   HCT 37.4 36.0 - 46.0 %   MCV 91.2 78.0 - 100.0 fL   MCH 30.0 26.0 - 34.0 pg   MCHC 32.9 30.0 - 36.0 g/dL   RDW 13.0 11.5 - 15.5 %   Platelets 345 150 - 400 K/uL  Glucose, capillary     Status: Abnormal   Collection  Time: 07/02/16  8:49 AM  Result Value Ref Range   Glucose-Capillary 151 (H) 65 - 99 mg/dL   Comment 1 Capillary Specimen     Intake/Output Summary (Last 24 hours) at 07/02/16 0931 Last data filed at 07/02/16 0600  Gross per 24 hour  Intake  649.5 ml  Output      0 ml  Net  649.5 ml    Telemetry:  No events  Most Recent Cardiac Studies: Cardiac catheterization 07-09-2016: Coronary Findings    Dominance: Co-dominant   Left Main   . Ost LM to LM lesion, 40% stenosed.     Left Anterior Descending   . Ost LAD lesion, 40% stenosed.   . Prox LAD lesion, 95% stenosed.   . Second Diagonal Branch   . 2nd Diag lesion, 50% stenosed.     Left Circumflex   . Ost Cx to Prox Cx lesion, 60% stenosed. Severely Calcified diffuse.   . Prox Cx lesion, 95% stenosed.   . Mid Cx lesion, 60% stenosed.     Right  Coronary Artery   . Mid RCA lesion, 95% stenosed.      Wall Motion                 Left Heart    Left Ventricle The left ventricular size is normal. The left ventricular systolic function is normal. There are wall motion abnormalities in the left ventricle. There are segmental wall motion abnormalities in the left ventricle. Mild inferior wall hypokinesis   Mitral Valve There is no mitral valve stenosis, no mitral valve regurgitation and no mitral valve prolapse evident. The annulus is normal.   Aortic Valve There is no aortic valve stenosis. No calcification found in the aortic valve. There is normal aortic valve motion.   Aorta The size of the ascending aorta is normal.    Coronary Diagrams    Diagnostic Diagram         Case discussed with Dr. Fletcher Anon. Initially was unclear if left main was severe and degree of stenosis of the mid LAD was not appreciated. Dr. Fletcher Anon gave intercoronary nitroglycerin which clearly documented mid LAD lesion .   Recommendations:   Plan is to transfer to [facility] for consideration of bypass surgery. She will need medical risk factor modification. Poor control diabetes, hyperlipidemia, smoker. Morbid obesity Needs significant lifestyle changes  Echo 06/28/2016: Study Conclusions - Left ventricle: The cavity size was normal. There was mild  concentric hypertrophy. Systolic function was normal. The  estimated ejection fraction was in the range of 50% to 55%.  Select image suggestive of mild inferior wall hypokinesis.  Doppler parameters are consistent with abnormal left ventricular  relaxation (grade 1 diastolic dysfunction). - Mitral valve: There was mild regurgitation. - Left atrium: The atrium was normal in size. - Right ventricle: Systolic function was normal. - Pulmonary arteries: Systolic pressure was within the normal  range.         ASSESSMENT AND PLAN:  Principal Problem:   NSTEMI (non-ST elevated myocardial infarction) ([place]) Active Problems:   HLD (hyperlipidemia)   ADJUSTMENT DISORDER WITH MIXED FEATURES   HTN (hypertension), malignant   Agoraphobia with panic attacks   MDD (major depressive disorder) (HCC)   Tobacco abuse   Uncontrolled type 2 diabetes mellitus with complication, without long-term current use of insulin (HCC)   CAD in native artery   Metabolic syndrome   # 3 Vessel CAD: # NSTEMI: # R Carotid stenosis: Scheduled for CABG and R carotid endarterectomy on 07/06/16.  Currently chest pain free.  Continue heparin, aspirin, carvedilol, and atorvastatin.  Continue Imdur 30 mg daily.  Encouraged ambulation as long as she is not having chest pain so that she does not get deconditioned pre-op.  # Hypertensive heart disease: Blood pressure is much better-controlled since admission.  She has been bradycardic but not symptomatic.  Poor outpatient control likely due to non-compliance.  Continue carvedilol, enalapril, and spironolactone. We will reduce verapamil to 40 mg q8h.  May be able to stop.  Her home HCTZ  was held on admission.    # Hyperlipidemia:  Triglycerides were 436 this admission.  This is at least in part due to poor glycemic control.   Unable to calculate LDL.  Simvastatin was switched to atorvastatin this admission.    # Diabetes mellitus type 2: Very poorly-controlled.  Hemoglobin A1c is 10.4%.  Home metformin has been held.  Accuchecks are <200.  Continue SSI.  Appreciate diabetes coordinator.  # Tobacco abuse: Nicotine patch.  Continued to encourage smoking cessation.   #  Depression, PTSD, panic disorder: Continue Prozac, Klonopin and Lamictal.    Tresea Heine C. Oval Linsey, MD, [facility] 07/02/2016 9:31 AM

## 2016-07-02 NOTE — Progress Notes (Signed)
CARDIAC REHAB PHASE I   PRE:  Rate/Rhythm: 59 SB  BP:  Sitting: 99/58        SaO2: 93 RA  MODE:  Ambulation: 350 ft   POST:  Rate/Rhythm: 68 SR  BP:  Sitting: 99/76         SaO2: 94 RA  Pt ambulated 350 ft on RA, IV, assist x1, slow, steady gait, tolerated well. Pt c/o some hip pain, mild dizziness, denies any other complaints.  Encouraged additional ambulation today as tolerated, out of bed to chair.  Pt to bed per pt request after walk, call bell within reach. Will follow.   [id number] Lenna Sciara, RN, BSN 07/02/2016 10:45 AM

## 2016-07-02 NOTE — Progress Notes (Signed)
ANTICOAGULATION CONSULT NOTE - Follow Up Consult  Pharmacy Consult for Heparin  Indication: Severe multi-vessel CAD awaiting CABG  No Known Allergies  Patient Measurements: Height: 5\' 7"  (170.2 cm) Weight: 214 lb 6.4 oz (97.251 kg) IBW/kg (Calculated) : 61.6  Vital Signs: Temp: 97.7 F (36.5 C) (07/10 0800) Temp Source: Oral (07/10 0800) BP: 90/63 mmHg (07/10 0800) Pulse Rate: 54 (07/10 0800)  Labs:  Recent Labs  06/30/16 0345  06/30/16 2315 07/01/16 0649 07/02/16 0404 07/02/16 1039  HGB 12.8  --   --  13.2 12.3  --   HCT 39.4  --   --  40.6 37.4  --   PLT 349  --   --  358 345  --   LABPROT 14.1  --   --   --   --   --   INR 1.07  --   --   --   --   --   HEPARINUNFRC  --   < > 0.27* 0.50  --  0.53  CREATININE 0.67  --   --   --   --   --   < > = values in this interval not displayed.  Estimated Creatinine Clearance: 109.8 mL/min (by C-G formula based on Cr of 0.67).  Assessment: Erika [name] is 45 yo with significant CAD and is awaiting CABG.  She was started on IV heparin s/p cath that revealed multi-vessel disease.  Plans for combined CABG/carotid endarterectomy later this week.   HL this AM is 0.53 on heparin 1950 units/hr. CBC is stable and no issues with infusion or bleeding noted.  Goal of Therapy:  Heparin level 0.3-0.7 units/ml Monitor platelets by anticoagulation protocol: Yes   Plan:  Continue heparin 1950 units/hr Daily HL/CBC Monitor for s/s of bleeding   Andrey Cota. Diona Foley, PharmD, [place] Clinical Pharmacist Pager [phone]  07/02/2016,9:07 AM

## 2016-07-02 NOTE — Progress Notes (Signed)
Results for FARREL, SISEMORE (MRN [id number]) as of 07/02/2016 14:49  Ref. Range 06/28/2016 05:44  Hemoglobin A1C Latest Ref Range: 4.0-6.0 % 10.4 (H)   Spoke with patient regarding A1C results.  She states that she has not been taking care of herself and instead caring for her daughter who was recently newly diagnosed with diabetes in the past year.  She states that she is familiar with diabetes management due to her daughter.  She also has several relatives with diabetes.   She is familiar with insulin administration as well.  Discussed diabetes management and the relation of diabetes to heart disease.  She states that this was new information to her during this hospitalization. She is open to going home on insulin and is motivated to caring for herself, stating "I'm only 44".  Will need further follow-up and education after surgery.  Patient appreciative of visit. Will follow.  Thanks, Adah Perl, RN, BC-ADM Inpatient Diabetes Coordinator Pager [phone] (8a-5p)

## 2016-07-03 LAB — CBC
HEMATOCRIT: 38.9 % (ref 36.0–46.0)
Hemoglobin: 12.4 g/dL (ref 12.0–15.0)
MCH: 29 pg (ref 26.0–34.0)
MCHC: 31.9 g/dL (ref 30.0–36.0)
MCV: 91.1 fL (ref 78.0–100.0)
Platelets: 356 10*3/uL (ref 150–400)
RBC: 4.27 MIL/uL (ref 3.87–5.11)
RDW: 13.1 % (ref 11.5–15.5)
WBC: 13.3 10*3/uL — AB (ref 4.0–10.5)

## 2016-07-03 LAB — HEPARIN LEVEL (UNFRACTIONATED): Heparin Unfractionated: 0.45 IU/mL (ref 0.30–0.70)

## 2016-07-03 LAB — GLUCOSE, CAPILLARY
GLUCOSE-CAPILLARY: 146 mg/dL — AB (ref 65–99)
Glucose-Capillary: 135 mg/dL — ABNORMAL HIGH (ref 65–99)
Glucose-Capillary: 119 mg/dL — ABNORMAL HIGH (ref 65–99)
Glucose-Capillary: 146 mg/dL — ABNORMAL HIGH (ref 65–99)

## 2016-07-03 MED ORDER — CARVEDILOL 12.5 MG PO TABS
12.5000 mg | ORAL_TABLET | Freq: Two times a day (BID) | ORAL | Status: DC
  Administered 2016-07-03: 12.5 mg via ORAL
  Filled 2016-07-03 (×2): qty 1

## 2016-07-03 NOTE — Progress Notes (Signed)
ANTICOAGULATION CONSULT NOTE - Follow Up Consult  Pharmacy Consult for Heparin  Indication: Severe multi-vessel CAD awaiting CABG  No Known Allergies  Patient Measurements: Height: 5\' 7"  (170.2 cm) Weight: 215 lb 3.2 oz (97.614 kg) IBW/kg (Calculated) : 61.6  Vital Signs: Temp: 97.7 F (36.5 C) (07/11 0844) Temp Source: Oral (07/11 0844) BP: 118/75 mmHg (07/11 0844) Pulse Rate: 57 (07/11 0844)  Labs:  Recent Labs  07/01/16 0649 07/02/16 0404 07/02/16 1039 07/03/16 0350 07/03/16 0358  HGB 13.2 12.3  --  12.4  --   HCT 40.6 37.4  --  38.9  --   PLT 358 345  --  356  --   HEPARINUNFRC 0.50  --  0.53  --  0.45    Estimated Creatinine Clearance: 109.9 mL/min (by C-G formula based on Cr of 0.67).  Assessment: Erika [name] is 45 yo with significant CAD and is awaiting CABG.  She was started on IV heparin s/p cath that revealed multi-vessel disease.  Plans for combined CABG/carotid endarterectomy later this week.   HL this AM is 0.45 on heparin 1950 units/hr. CBC is stable and no issues with infusion or bleeding noted.  Goal of Therapy:  Heparin level 0.3-0.7 units/ml Monitor platelets by anticoagulation protocol: Yes   Plan:  Continue heparin 1950 units/hr Daily HL/CBC Monitor for s/s of bleeding   Andrey Cota. Diona Foley, PharmD, BCPS Clinical Pharmacist Pager [phone]  07/03/2016,10:16 AM

## 2016-07-03 NOTE — Progress Notes (Signed)
CARDIAC REHAB PHASE I   PRE:  Rate/Rhythm: 64 SR  BP:  Supine:   Sitting: 128/75  Standing:    SaO2: 92%RA  MODE:  Ambulation: 270 ft   POST:  Rate/Rhythm: 71 SR  BP:  Supine:   Sitting: 122/74  Standing:    SaO2: 97%RA 1202-1215 Pt walked 270 ft with steady gait. Cut walk short due to hip pain. Slightly lightheaded but no CP. Tolerated well.   Graylon Good, RN BSN  07/03/2016 12:12 PM

## 2016-07-03 NOTE — Progress Notes (Signed)
PATIENT ID: Erika [name] is a 33F with poorly-controlled DM (A1c 10.4%), poorly-controlled hypertension, ongoing tobacco abuse and non-compliance here with NSTEMI and found to have 3 vessel CAD.  Planned for CABG on 7/14 with Dr. Prescott Gum.  SUBJECTIVE:  Feels well.  Denies chest pain or shortness of breath.  She does note some dizziness with walking.  PHYSICAL EXAM Filed Vitals:   07/02/16 2002 07/02/16 2152 07/03/16 0000 07/03/16 0339  BP: 105/64 116/71 116/65 116/67  Pulse:      Temp:   97.6 F (36.4 C) 97.7 F (36.5 C)  TempSrc:   Oral Oral  Resp:   18 20  Height:      Weight:    215 lb 3.2 oz (97.614 kg)  SpO2:   95% 96%   General:  Well-appearing.  No acute distress Neck: No JVD Lungs:  CTAB.  No crackles, rhonchi or wheezes Heart:  RRR.  No m/r/g.  Normal S1/S2 Abdomen:  Soft, NT, ND.  +BS Extremities:  No edema   LABS: Lab Results  Component Value Date   TROPONINI 0.82* 06/28/2016   Results for orders placed or performed during the hospital encounter of 2016/07/23 (from the past 24 hour(s))  Glucose, capillary     Status: Abnormal   Collection Time: 07/02/16  8:49 AM  Result Value Ref Range   Glucose-Capillary 151 (H) 65 - 99 mg/dL   Comment 1 Capillary Specimen   Heparin level (unfractionated)     Status: None   Collection Time: 07/02/16 10:39 AM  Result Value Ref Range   Heparin Unfractionated 0.53 0.30 - 0.70 IU/mL  Glucose, capillary     Status: Abnormal   Collection Time: 07/02/16  1:10 PM  Result Value Ref Range   Glucose-Capillary 191 (H) 65 - 99 mg/dL   Comment 1 Capillary Specimen   Glucose, capillary     Status: Abnormal   Collection Time: 07/02/16  4:59 PM  Result Value Ref Range   Glucose-Capillary 144 (H) 65 - 99 mg/dL   Comment 1 Capillary Specimen   Glucose, capillary     Status: Abnormal   Collection Time: 07/02/16  9:56 PM  Result Value Ref Range   Glucose-Capillary 129 (H) 65 - 99 mg/dL  CBC     Status: Abnormal   Collection Time:  07/03/16  3:50 AM  Result Value Ref Range   WBC 13.3 (H) 4.0 - 10.5 K/uL   RBC 4.27 3.87 - 5.11 MIL/uL   Hemoglobin 12.4 12.0 - 15.0 g/dL   HCT 38.9 36.0 - 46.0 %   MCV 91.1 78.0 - 100.0 fL   MCH 29.0 26.0 - 34.0 pg   MCHC 31.9 30.0 - 36.0 g/dL   RDW 13.1 11.5 - 15.5 %   Platelets 356 150 - 400 K/uL  Heparin level (unfractionated)     Status: None   Collection Time: 07/03/16  3:58 AM  Result Value Ref Range   Heparin Unfractionated 0.45 0.30 - 0.70 IU/mL    Intake/Output Summary (Last 24 hours) at 07/03/16 0846 Last data filed at 07/03/16 0800  Gross per 24 hour  Intake   1128 ml  Output      0 ml  Net   1128 ml    Telemetry:  Second degree sinoatrial block ([place] I).  Bradycardia  Most Recent Cardiac Studies: Cardiac catheterization 07/23/16: Coronary Findings    Dominance: Co-dominant   Left Main   . Ost LM to LM lesion, 40% stenosed.  Left Anterior Descending   . Ost LAD lesion, 40% stenosed.   . Prox LAD lesion, 95% stenosed.   . Second Diagonal Branch   . 2nd Diag lesion, 50% stenosed.     Left Circumflex   . Ost Cx to Prox Cx lesion, 60% stenosed. Severely Calcified diffuse.   . Prox Cx lesion, 95% stenosed.   . Mid Cx lesion, 60% stenosed.     Right Coronary Artery   . Mid RCA lesion, 95% stenosed.      Wall Motion                 Left Heart    Left Ventricle The left ventricular size is normal. The left ventricular systolic function is normal. There are wall motion abnormalities in the left ventricle. There are segmental wall motion abnormalities in the left ventricle. Mild inferior wall hypokinesis   Mitral Valve There is no mitral valve stenosis, no mitral valve regurgitation and no mitral valve prolapse evident. The annulus is normal.   Aortic Valve There is no aortic valve stenosis. No calcification found in the aortic valve. There is normal aortic valve motion.    Aorta The size of the ascending aorta is normal.    Coronary Diagrams    Diagnostic Diagram         Case discussed with Dr. Fletcher Anon. Initially was unclear if left main was severe and degree of stenosis of the mid LAD was not appreciated. Dr. Fletcher Anon gave intercoronary nitroglycerin which clearly documented mid LAD lesion .   Recommendations:   Plan is to transfer to [facility] for consideration of bypass surgery. She will need medical risk factor modification. Poor control diabetes, hyperlipidemia, smoker. Morbid obesity Needs significant lifestyle changes  Echo 06/28/2016: Study Conclusions - Left ventricle: The cavity size was normal. There was mild  concentric hypertrophy. Systolic function was normal. The  estimated ejection fraction was in the range of 50% to 55%.  Select image suggestive of mild inferior wall hypokinesis.  Doppler parameters are consistent with abnormal left ventricular  relaxation (grade 1 diastolic dysfunction). - Mitral valve: There was mild regurgitation. - Left atrium: The atrium was normal in size. - Right ventricle: Systolic function was normal. - Pulmonary arteries: Systolic pressure was within the normal  range.         ASSESSMENT AND PLAN:  Principal Problem:   NSTEMI (non-ST elevated myocardial infarction) ([place]) Active Problems:   HLD (hyperlipidemia)   ADJUSTMENT DISORDER WITH MIXED FEATURES   HTN (hypertension), malignant   Agoraphobia with panic attacks   MDD (major depressive disorder) (HCC)   Tobacco abuse   Uncontrolled type 2 diabetes mellitus with complication, without long-term current use of insulin (HCC)   CAD in native artery   Metabolic syndrome   Bradycardia   CAD (coronary artery disease)   Carotid stenosis   # 3 Vessel CAD: # NSTEMI: # R Carotid stenosis: Scheduled for CABG and R carotid endarterectomy on 07/06/16.  Currently chest pain free.  Continue heparin, aspirin, carvedilol, and atorvastatin.   Continue Imdur 30 mg daily.  Encouraged ambulation as long as she is not having chest pain so that she does not get deconditioned pre-op.  # Hypertensive heart disease: Blood pressure Has been low and she is also bradycardic. Stop verapamil. We will also reduce her carvedilol to 12.5 mg twice a day. Poor outpatient control likely due to non-compliance, as her antihypertensive regimen was already reduced on admission.  Continue carvedilol, enalapril, and spironolactone. We  will reduce verapamil to 40 mg q8h.  May be able to stop.  Her home HCTZ was held on admission.    # Hyperlipidemia:  Triglycerides were 436 this admission.  This is at least in part due to poor glycemic control.   Unable to calculate LDL.  Simvastatin was switched to atorvastatin this admission.    # Diabetes mellitus type 2: Very poorly-controlled.  Hemoglobin A1c is 10.4%.  Home metformin has been held.  Accuchecks are <200.  Continue SSI.  Appreciate diabetes coordinator.  # Tobacco abuse: Nicotine patch.  Continued to encourage smoking cessation.   # Depression, PTSD, panic disorder: Continue Prozac, Klonopin and Lamictal.   # Leukocytosis: Chronic.  U/A negative this admission.    Hailie Searight C. Oval Linsey, MD, [facility] 07/03/2016 8:46 AM

## 2016-07-04 LAB — GLUCOSE, CAPILLARY
GLUCOSE-CAPILLARY: 114 mg/dL — AB (ref 65–99)
Glucose-Capillary: 133 mg/dL — ABNORMAL HIGH (ref 65–99)
Glucose-Capillary: 142 mg/dL — ABNORMAL HIGH (ref 65–99)
Glucose-Capillary: 153 mg/dL — ABNORMAL HIGH (ref 65–99)

## 2016-07-04 LAB — CBC
HEMATOCRIT: 42.2 % (ref 36.0–46.0)
HEMOGLOBIN: 13.7 g/dL (ref 12.0–15.0)
MCH: 29.9 pg (ref 26.0–34.0)
MCHC: 32.5 g/dL (ref 30.0–36.0)
MCV: 92.1 fL (ref 78.0–100.0)
Platelets: 427 10*3/uL — ABNORMAL HIGH (ref 150–400)
RBC: 4.58 MIL/uL (ref 3.87–5.11)
RDW: 13.3 % (ref 11.5–15.5)
WBC: 12.5 10*3/uL — AB (ref 4.0–10.5)

## 2016-07-04 LAB — BLOOD GAS, ARTERIAL
Acid-Base Excess: 5.1 mmol/L — ABNORMAL HIGH (ref 0.0–2.0)
Bicarbonate: 29.5 mEq/L — ABNORMAL HIGH (ref 20.0–24.0)
Drawn by: 213381
FIO2: 0.21
O2 Saturation: 93.1 %
Patient temperature: 98.6
TCO2: 30.9 mmol/L (ref 0–100)
pCO2 arterial: 47 mmHg — ABNORMAL HIGH (ref 35.0–45.0)
pH, Arterial: 7.414 (ref 7.350–7.450)
pO2, Arterial: 68.4 mmHg — ABNORMAL LOW (ref 80.0–100.0)

## 2016-07-04 LAB — BASIC METABOLIC PANEL
ANION GAP: 9 (ref 5–15)
BUN: 8 mg/dL (ref 6–20)
CHLORIDE: 98 mmol/L — AB (ref 101–111)
CO2: 30 mmol/L (ref 22–32)
Calcium: 10.4 mg/dL — ABNORMAL HIGH (ref 8.9–10.3)
Creatinine, Ser: 0.86 mg/dL (ref 0.44–1.00)
GFR calc Af Amer: >60 mL/min (ref >60)
GFR calc non Af Amer: >60 mL/min (ref >60)
GLUCOSE: 106 mg/dL — AB (ref 65–99)
POTASSIUM: 4.2 mmol/L (ref 3.5–5.1)
Sodium: 137 mmol/L (ref 135–145)

## 2016-07-04 LAB — HEPARIN LEVEL (UNFRACTIONATED): Heparin Unfractionated: 0.44 IU/mL (ref 0.30–0.70)

## 2016-07-04 MED ORDER — CARVEDILOL 6.25 MG PO TABS: 6.2500 mg | ORAL_TABLET | Freq: Two times a day (BID) | ORAL | Status: DC

## 2016-07-04 NOTE — Progress Notes (Signed)
CARDIAC REHAB PHASE I   PRE:  Rate/Rhythm: 24 SB    BP: sitting 128/81    SaO2:   MODE:  Ambulation: 350 ft   POST:  Rate/Rhythm: 80 SR    BP: sitting 137/104     SaO2:   Pt apparently has been having brady and pauses however I did not see any. Tolerated well. Pt c/o slight lightheadedness due to taking vicodin. To recliner. No angina. [id number]  [place], ACSM 07/04/2016 11:39 AM

## 2016-07-04 NOTE — Progress Notes (Signed)
ANTICOAGULATION CONSULT NOTE - Follow Up Consult  Pharmacy Consult for Heparin Indication: severe multi-vessel CAD awaiting CABG  No Known Allergies  Patient Measurements: Height: 5\' 7"  (170.2 cm) Weight: 213 lb 10 oz (96.9 kg) IBW/kg (Calculated) : 61.6 Heparin Dosing Weight: 83kg  Vital Signs: Temp: 98 F (36.7 C) (07/12 0813) Temp Source: Oral (07/12 0813) BP: 129/87 mmHg (07/12 0813) Pulse Rate: 63 (07/12 0813)  Labs:  Recent Labs  07/02/16 0404 07/02/16 1039 07/03/16 0350 07/03/16 0358 07/04/16 0253  HGB 12.3  --  12.4  --  13.7  HCT 37.4  --  38.9  --  42.2  PLT 345  --  356  --  427*  HEPARINUNFRC  --  0.53  --  0.45 0.44  CREATININE  --   --   --   --  0.86    Estimated Creatinine Clearance: 101.8 mL/min (by C-G formula based on Cr of 0.86).   Medications:  Heparin @ 1950 units/hr  Assessment: 44yof continues on heparin for severe multivessel CAD awaiting CABG and right carotid endarterectomy on 7/14. Heparin level is therapeutic at 0.44. CBC is stable. No bleeding.  Goal of Therapy:  Heparin level 0.3-0.7 units/ml Monitor platelets by anticoagulation protocol: Yes   Plan:  1) Continue heparin at 1950 units/hr 2) Daily heparin level and CBC  Deboraha Sprang 07/04/2016,10:18 AM

## 2016-07-04 NOTE — Progress Notes (Signed)
PATIENT ID: Erika [name] is a 23F with poorly-controlled DM (A1c 10.4%), poorly-controlled hypertension, ongoing tobacco abuse and non-compliance here with NSTEMI and found to have 3 vessel CAD.  Planned for CABG on 7/14 with Dr. Prescott Gum.  SUBJECTIVE:  Feels well.  Denies chest pain or shortness of breath.  Had some back pain overnight.  Denies lightheadedness or dizziness.   PHYSICAL EXAM Filed Vitals:   07/03/16 2336 07/04/16 0341 07/04/16 0408 07/04/16 0409  BP: 132/77 136/82 131/67   Pulse: 65   58  Temp: 97.7 F (36.5 C)   98.1 F (36.7 C)  TempSrc: Oral   Oral  Resp: 18   16  Height:      Weight:    213 lb 10 oz (96.9 kg)  SpO2: 93%   92%   General:  Well-appearing.  No acute distress Neck: No JVD Lungs:  CTAB.  No crackles, rhonchi or wheezes Heart:  RRR.  No m/r/g.  Normal S1/S2 Abdomen:  Soft, NT, ND.  +BS Extremities:  No edema   LABS: Lab Results  Component Value Date   TROPONINI 0.82* 06/28/2016   Results for orders placed or performed during the hospital encounter of 06/29/16 (from the past 24 hour(s))  Glucose, capillary     Status: Abnormal   Collection Time: 07/03/16  8:46 AM  Result Value Ref Range   Glucose-Capillary 135 (H) 65 - 99 mg/dL   Comment 1 Capillary Specimen   Glucose, capillary     Status: Abnormal   Collection Time: 07/03/16 12:20 PM  Result Value Ref Range   Glucose-Capillary 119 (H) 65 - 99 mg/dL   Comment 1 Capillary Specimen   Glucose, capillary     Status: Abnormal   Collection Time: 07/03/16  3:58 PM  Result Value Ref Range   Glucose-Capillary 146 (H) 65 - 99 mg/dL   Comment 1 Capillary Specimen   Glucose, capillary     Status: Abnormal   Collection Time: 07/03/16  9:39 PM  Result Value Ref Range   Glucose-Capillary 146 (H) 65 - 99 mg/dL   Comment 1 Capillary Specimen   Heparin level (unfractionated)     Status: None   Collection Time: 07/04/16  2:53 AM  Result Value Ref Range   Heparin Unfractionated 0.44 0.30 - 0.70  IU/mL  CBC     Status: Abnormal   Collection Time: 07/04/16  2:53 AM  Result Value Ref Range   WBC 12.5 (H) 4.0 - 10.5 K/uL   RBC 4.58 3.87 - 5.11 MIL/uL   Hemoglobin 13.7 12.0 - 15.0 g/dL   HCT 42.2 36.0 - 46.0 %   MCV 92.1 78.0 - 100.0 fL   MCH 29.9 26.0 - 34.0 pg   MCHC 32.5 30.0 - 36.0 g/dL   RDW 13.3 11.5 - 15.5 %   Platelets 427 (H) 150 - 400 K/uL  Basic metabolic panel     Status: Abnormal   Collection Time: 07/04/16  2:53 AM  Result Value Ref Range   Sodium 137 135 - 145 mmol/L   Potassium 4.2 3.5 - 5.1 mmol/L   Chloride 98 (L) 101 - 111 mmol/L   CO2 30 22 - 32 mmol/L   Glucose, Bld 106 (H) 65 - 99 mg/dL   BUN 8 6 - 20 mg/dL   Creatinine, Ser 0.86 0.44 - 1.00 mg/dL   Calcium 10.4 (H) 8.9 - 10.3 mg/dL   GFR calc non Af Amer >60 >60 mL/min   GFR calc Af Amer >  60 >60 mL/min   Anion gap 9 5 - 15    Intake/Output Summary (Last 24 hours) at 07/04/16 0759 Last data filed at 07/04/16 0600  Gross per 24 hour  Intake  911.5 ml  Output      0 ml  Net  911.5 ml    Telemetry:  Second degree sinoatrial block (Mobitz I).  Bradycardia rates 50-60s.   Most Recent Cardiac Studies: Cardiac catheterization 07-24-2016: Coronary Findings    Dominance: Co-dominant   Left Main   . Ost LM to LM lesion, 40% stenosed.     Left Anterior Descending   . Ost LAD lesion, 40% stenosed.   . Prox LAD lesion, 95% stenosed.   . Second Diagonal Branch   . 2nd Diag lesion, 50% stenosed.     Left Circumflex   . Ost Cx to Prox Cx lesion, 60% stenosed. Severely Calcified diffuse.   . Prox Cx lesion, 95% stenosed.   . Mid Cx lesion, 60% stenosed.     Right Coronary Artery   . Mid RCA lesion, 95% stenosed.      Wall Motion                 Left Heart    Left Ventricle The left ventricular size is normal. The left ventricular systolic function is normal. There are wall motion abnormalities in the left ventricle. There are  segmental wall motion abnormalities in the left ventricle. Mild inferior wall hypokinesis   Mitral Valve There is no mitral valve stenosis, no mitral valve regurgitation and no mitral valve prolapse evident. The annulus is normal.   Aortic Valve There is no aortic valve stenosis. No calcification found in the aortic valve. There is normal aortic valve motion.   Aorta The size of the ascending aorta is normal.    Coronary Diagrams    Diagnostic Diagram         Case discussed with Dr. Fletcher Anon. Initially was unclear if left main was severe and degree of stenosis of the mid LAD was not appreciated. Dr. Fletcher Anon gave intercoronary nitroglycerin which clearly documented mid LAD lesion .   Recommendations:   Plan is to transfer to [facility] for consideration of bypass surgery. She will need medical risk factor modification. Poor control diabetes, hyperlipidemia, smoker. Morbid obesity Needs significant lifestyle changes  Echo 06/28/2016: Study Conclusions - Left ventricle: The cavity size was normal. There was mild  concentric hypertrophy. Systolic function was normal. The  estimated ejection fraction was in the range of 50% to 55%.  Select image suggestive of mild inferior wall hypokinesis.  Doppler parameters are consistent with abnormal left ventricular  relaxation (grade 1 diastolic dysfunction). - Mitral valve: There was mild regurgitation. - Left atrium: The atrium was normal in size. - Right ventricle: Systolic function was normal. - Pulmonary arteries: Systolic pressure was within the normal  range.         ASSESSMENT AND PLAN:  Principal Problem:   NSTEMI (non-ST elevated myocardial infarction) ([place]) Active Problems:   HLD (hyperlipidemia)   ADJUSTMENT DISORDER WITH MIXED FEATURES   HTN (hypertension), malignant   Agoraphobia with panic attacks   MDD (major depressive disorder) ([facility])   Tobacco abuse   Uncontrolled type 2 diabetes mellitus  with complication, without long-term current use of insulin ([facility])   CAD in native artery   Metabolic syndrome   Bradycardia   CAD (coronary artery disease)   Carotid stenosis   # 3 Vessel CAD: # NSTEMI: # R  Carotid stenosis: Scheduled for CABG and R carotid endarterectomy on 07/06/16.  Currently chest pain free.  Continue heparin, aspirin, carvedilol, and atorvastatin.  Continue Imdur 30 mg daily.     # Hypertensive heart disease: Blood pressure remains well-controlled after reducing carvedilol and stopping verapamil. We will reduce carvedilol to 6.25 mg every 12 hours given her bradycardia.  Continue enalapril, and spironolactone.  Her home HCTZ was held on admission.    # Hyperlipidemia:  Triglycerides were 436 this admission.  This is at least in part due to poor glycemic control.   Unable to calculate LDL.  Simvastatin was switched to atorvastatin this admission.    # Diabetes mellitus type 2: Very poorly-controlled.  Hemoglobin A1c is 10.4%.  Home metformin has been held.  Accuchecks are <200.  Continue SSI.  Appreciate diabetes coordinator.  # Tobacco abuse: Nicotine patch.  Continued to encourage smoking cessation.   # Depression, PTSD, panic disorder: Continue Prozac, Klonopin and Lamictal.   # Leukocytosis: Chronic.  U/A negative this admission.    Vantasia Pinkney C. Oval Linsey, MD, [facility] 07/04/2016 7:59 AM

## 2016-07-05 ENCOUNTER — Inpatient Hospital Stay (HOSPITAL_COMMUNITY): Payer: BLUE CROSS/BLUE SHIELD

## 2016-07-05 DIAGNOSIS — I25110 Atherosclerotic heart disease of native coronary artery with unstable angina pectoris: Secondary | ICD-10-CM

## 2016-07-05 LAB — PREPARE RBC (CROSSMATCH)

## 2016-07-05 LAB — COMPREHENSIVE METABOLIC PANEL
ALT: 116 U/L — ABNORMAL HIGH (ref 14–54)
AST: 91 U/L — ABNORMAL HIGH (ref 15–41)
Albumin: 3.4 g/dL — ABNORMAL LOW (ref 3.5–5.0)
Alkaline Phosphatase: 166 U/L — ABNORMAL HIGH (ref 38–126)
Anion gap: 9 (ref 5–15)
BUN: 8 mg/dL (ref 6–20)
CO2: 29 mmol/L (ref 22–32)
Calcium: 10.3 mg/dL (ref 8.9–10.3)
Chloride: 99 mmol/L — ABNORMAL LOW (ref 101–111)
Creatinine, Ser: 0.84 mg/dL (ref 0.44–1.00)
GFR calc Af Amer: >60 mL/min (ref >60)
GFR calc non Af Amer: >60 mL/min (ref >60)
Glucose, Bld: 108 mg/dL — ABNORMAL HIGH (ref 65–99)
Potassium: 4.1 mmol/L (ref 3.5–5.1)
Sodium: 137 mmol/L (ref 135–145)
Total Bilirubin: 0.4 mg/dL (ref 0.3–1.2)
Total Protein: 7 g/dL (ref 6.5–8.1)

## 2016-07-05 LAB — GLUCOSE, CAPILLARY
GLUCOSE-CAPILLARY: 178 mg/dL — AB (ref 65–99)
GLUCOSE-CAPILLARY: 118 mg/dL — AB (ref 65–99)
GLUCOSE-CAPILLARY: 177 mg/dL — AB (ref 65–99)
GLUCOSE-CAPILLARY: 168 mg/dL — AB (ref 65–99)

## 2016-07-05 LAB — HEPARIN LEVEL (UNFRACTIONATED): HEPARIN UNFRACTIONATED: 0.47 [IU]/mL (ref 0.30–0.70)

## 2016-07-05 LAB — ABO/RH: ABO/RH(D): B POS

## 2016-07-05 LAB — CBC
HEMATOCRIT: 39.7 % (ref 36.0–46.0)
Hemoglobin: 13 g/dL (ref 12.0–15.0)
MCH: 30 pg (ref 26.0–34.0)
MCHC: 32.7 g/dL (ref 30.0–36.0)
MCV: 91.5 fL (ref 78.0–100.0)
PLATELETS: 385 10*3/uL (ref 150–400)
RBC: 4.34 MIL/uL (ref 3.87–5.11)
RDW: 13.2 % (ref 11.5–15.5)
WBC: 12.7 10*3/uL — AB (ref 4.0–10.5)

## 2016-07-05 MED ORDER — TEMAZEPAM 15 MG PO CAPS: 15.0000 mg | ORAL_CAPSULE | Freq: Once | ORAL | Status: DC | PRN

## 2016-07-05 MED ORDER — CHLORHEXIDINE GLUCONATE 4 % EX LIQD
60.0000 mL | Freq: Once | CUTANEOUS | Status: AC
  Administered 2016-07-06: 4 via TOPICAL
  Filled 2016-07-05: qty 60

## 2016-07-05 MED ORDER — DOPAMINE-DEXTROSE 3.2-5 MG/ML-% IV SOLN
0.0000 ug/kg/min | INTRAVENOUS | Status: DC
  Administered 2016-07-06: 3 ug/kg/min via INTRAVENOUS
  Filled 2016-07-05: qty 250

## 2016-07-05 MED ORDER — DEXMEDETOMIDINE HCL IN NACL 400 MCG/100ML IV SOLN
0.1000 ug/kg/h | INTRAVENOUS | Status: DC
  Administered 2016-07-06: .2 ug/kg/h via INTRAVENOUS
  Filled 2016-07-05: qty 100

## 2016-07-05 MED ORDER — VANCOMYCIN HCL 10 G IV SOLR
1500.0000 mg | INTRAVENOUS | Status: DC
  Filled 2016-07-05: qty 1500

## 2016-07-05 MED ORDER — MAGNESIUM SULFATE 50 % IJ SOLN
40.0000 meq | INTRAMUSCULAR | Status: DC
  Filled 2016-07-05: qty 10

## 2016-07-05 MED ORDER — CHLORHEXIDINE GLUCONATE 4 % EX LIQD
60.0000 mL | Freq: Once | CUTANEOUS | Status: AC
  Administered 2016-07-05: 4 via TOPICAL
  Filled 2016-07-05: qty 60

## 2016-07-05 MED ORDER — SODIUM CHLORIDE 0.9 % IV SOLN
INTRAVENOUS | Status: DC
  Filled 2016-07-05: qty 30

## 2016-07-05 MED ORDER — NITROGLYCERIN IN D5W 200-5 MCG/ML-% IV SOLN
2.0000 ug/min | INTRAVENOUS | Status: DC
  Administered 2016-07-06: 10 ug/min via INTRAVENOUS
  Filled 2016-07-05: qty 250

## 2016-07-05 MED ORDER — CEFUROXIME SODIUM 750 MG IJ SOLR
750.0000 mg | INTRAMUSCULAR | Status: DC
  Filled 2016-07-05: qty 750

## 2016-07-05 MED ORDER — PHENYLEPHRINE HCL 10 MG/ML IJ SOLN
30.0000 ug/min | INTRAMUSCULAR | Status: DC
  Filled 2016-07-05: qty 2

## 2016-07-05 MED ORDER — CEFUROXIME SODIUM 1.5 G IJ SOLR
1.5000 g | INTRAMUSCULAR | Status: DC
  Filled 2016-07-05: qty 1.5

## 2016-07-05 MED ORDER — PLASMA-LYTE 148 IV SOLN
INTRAVENOUS | Status: DC
  Filled 2016-07-05: qty 2.5

## 2016-07-05 MED ORDER — POTASSIUM CHLORIDE 2 MEQ/ML IV SOLN
80.0000 meq | INTRAVENOUS | Status: DC
  Filled 2016-07-05: qty 40

## 2016-07-05 MED ORDER — SODIUM CHLORIDE 0.9 % IV SOLN
INTRAVENOUS | Status: DC
  Administered 2016-07-06: 1.4 [IU]/h via INTRAVENOUS
  Filled 2016-07-05: qty 2.5

## 2016-07-05 MED ORDER — DEXTROSE 5 % IV SOLN
0.0000 ug/min | INTRAVENOUS | Status: DC
  Filled 2016-07-05: qty 4

## 2016-07-05 MED ORDER — SODIUM CHLORIDE 0.9 % IV SOLN
INTRAVENOUS | Status: DC
  Administered 2016-07-06: 69.8 mL/h via INTRAVENOUS
  Filled 2016-07-05: qty 40

## 2016-07-05 MED ORDER — METOPROLOL TARTRATE 12.5 MG HALF TABLET: 12.5000 mg | ORAL_TABLET | Freq: Once | ORAL | Status: DC

## 2016-07-05 MED ORDER — CHLORHEXIDINE GLUCONATE 0.12 % MT SOLN
15.0000 mL | Freq: Once | OROMUCOSAL | Status: AC
Start: 2016-07-06 — End: 2016-07-06
  Administered 2016-07-06: 15 mL via OROMUCOSAL
  Filled 2016-07-05: qty 15

## 2016-07-05 MED ORDER — ALPRAZOLAM 0.25 MG PO TABS: 0.2500 mg | ORAL_TABLET | ORAL | Status: DC | PRN

## 2016-07-05 MED ORDER — BISACODYL 5 MG PO TBEC: 5.0000 mg | DELAYED_RELEASE_TABLET | Freq: Once | ORAL | Status: DC

## 2016-07-05 MED ORDER — ENALAPRIL MALEATE 10 MG PO TABS
10.0000 mg | ORAL_TABLET | Freq: Every day | ORAL | Status: DC
  Administered 2016-07-05: 10 mg via ORAL
  Filled 2016-07-05: qty 1

## 2016-07-05 MED ORDER — DIAZEPAM 2 MG PO TABS
2.0000 mg | ORAL_TABLET | Freq: Once | ORAL | Status: AC
  Administered 2016-07-06: 2 mg via ORAL
  Filled 2016-07-05: qty 1

## 2016-07-05 NOTE — Progress Notes (Signed)
Procedure(s) (LRB): CORONARY ARTERY BYPASS GRAFTING (CABG) ([name]) TRANSESOPHAGEAL ECHOCARDIOGRAM (TEE) ([name]) ENDARTERECTOMY CAROTID (Right) Subjective: Patient stable on IV heparin without angina despite severe three-vessel CAD She has had some bradycardia and her beta blocker is on hold She is planned for CABG 3 with bypass grafts the LAD, circumflex looks marginal and RCA in a.m. with combined right carotid endarterectomy. I have discussed the expected benefits of CABG with the patient including the alternatives as well as the potential risks of MI stroke bleeding and infection. She understands and agrees to proceed with surgery.  Objective: Vital signs in last 24 hours: Temp:  [97.5 F (36.4 C)-98.6 F (37 C)] 97.6 F (36.4 C) (07/13 1619) Pulse Rate:  [48-56] 52 (07/13 1619) Cardiac Rhythm:  [-] Sinus bradycardia (07/13 1203) Resp:  [15-16] 15 (07/13 1619) BP: (115-141)/(69-94) 136/71 mmHg (07/13 1619) SpO2:  [93 %-98 %] 96 % (07/13 1619) Weight:  [208 lb 15.9 oz (94.8 kg)] 208 lb 15.9 oz (94.8 kg) (07/13 0600)  Hemodynamic parameters for last 24 hours:  stable  Intake/Output from previous day: 07/12 0701 - 07/13 0700 In: 1308 [P.O.:840; I.V.:468] Out: 2750 [Urine:2750] Intake/Output this shift: Total I/O In: 606.5 [P.O.:470; I.V.:136.5] Out: -        Exam    General- alert and comfortable   Lungs- clear without rales, wheezes   Cor- regular rate and rhythm, no murmur , gallop   Abdomen- soft, non-tender   Extremities - warm, non-tender, minimal edema   Neuro- oriented, appropriate, no focal weakness   Lab Results:  Recent Labs  07/04/16 0253 07/05/16 0354  WBC 12.5* 12.7*  HGB 13.7 13.0  HCT 42.2 39.7  PLT 427* 385   BMET:  Recent Labs  07/04/16 0253 07/05/16 0354  NA 137 137  K 4.2 4.1  CL 98* 99*  CO2 30 29  GLUCOSE 106* 108*  BUN 8 8  CREATININE 0.86 0.84  CALCIUM 10.4* 10.3    PT/INR: No results for input(s): LABPROT, INR in the last  72 hours. ABG    Component Value Date/Time   PHART 7.414 07/04/2016 1000   HCO3 29.5* 07/04/2016 1000   TCO2 30.9 07/04/2016 1000   O2SAT 93.1 07/04/2016 1000   CBG (last 3)   Recent Labs  07/05/16 0846 07/05/16 1154 07/05/16 1605  GLUCAP 178* 118* 177*    Assessment/Plan: S/P Procedure(s) (LRB): CORONARY ARTERY BYPASS GRAFTING (CABG) ([name]) TRANSESOPHAGEAL ECHOCARDIOGRAM (TEE) ([name]) ENDARTERECTOMY CAROTID (Right) CABG in a.m.   LOS: 6 days    Tharon Aquas Trigt III 07/05/2016

## 2016-07-05 NOTE — Progress Notes (Signed)
Subjective: Interval History: none.. Anxious to proceed with surgery tomorrow   Objective: Vital signs in last 24 hours: Temp:  [98 F (36.7 C)-98.6 F (37 C)] 98 F (36.7 C) (07/13 0002) Pulse Rate:  [47-56] 53 (07/13 0755) Resp:  [16-18] 16 (07/13 0755) BP: (115-141)/(66-81) 141/71 mmHg (07/13 0755) SpO2:  [93 %-98 %] 98 % (07/13 0755) Weight:  [208 lb 15.9 oz (94.8 kg)] 208 lb 15.9 oz (94.8 kg) (07/13 0600)  Intake/Output from previous day: 07/12 0701 - 07/13 0700 In: 1308 [P.O.:840; I.V.:468] Out: 2750 [Urine:2750] Intake/Output this shift: Total I/O In: 19.5 [I.V.:19.5] Out: -   No change. Neurologically intact  Lab Results:  Recent Labs  07/04/16 0253 07/05/16 0354  WBC 12.5* 12.7*  HGB 13.7 13.0  HCT 42.2 39.7  PLT 427* 385   BMET  Recent Labs  07/04/16 0253 07/05/16 0354  NA 137 137  K 4.2 4.1  CL 98* 99*  CO2 30 29  GLUCOSE 106* 108*  BUN 8 8  CREATININE 0.86 0.84  CALCIUM 10.4* 10.3    Studies/Results: Dg Chest 2 View  07/05/2016  CLINICAL DATA:  Preoperative respiratory evaluation for CABG tomorrow. EXAM: CHEST  2 VIEW COMPARISON:  06/27/2016. FINDINGS: The lungs are clear wiithout focal pneumonia, edema, pneumothorax or pleural effusion. There is basilar atelectasis bilaterally. The cardio pericardial silhouette is enlarged. The visualized bony structures of the thorax are intact. Telemetry leads overlie the chest. IMPRESSION: Bibasilar atelectasis.  No acute findings. Electronically Signed   By: Misty Stanley M.D.   On: 07/05/2016 07:53   Dg Chest 2 View  06/27/2016  CLINICAL DATA:  Severe back/chest pain EXAM: CHEST  2 VIEW COMPARISON:  11/30/2015 FINDINGS: Lungs are clear.  No pleural effusion or pneumothorax. The heart is normal in size. Visualized osseous structures are within normal limits. IMPRESSION: Normal chest radiographs. Electronically Signed   By: Julian Hy M.D.   On: 06/27/2016 12:27   Ct Angio Neck W Or Wo  Contrast  06/30/2016  CLINICAL DATA:  45 year old female with poorly controlled diabetes and hypertension. Three vessel coronary disease with planned for CABG carotid ultrasound suggesting critical stenosis in the right ICA. Initial encounter. EXAM: CT ANGIOGRAPHY NECK TECHNIQUE: Multidetector CT imaging of the neck was performed using the standard protocol during bolus administration of intravenous contrast. Multiplanar CT image reconstructions and MIPs were obtained to evaluate the vascular anatomy. Carotid stenosis measurements (when applicable) are obtained utilizing NASCET criteria, using the distal internal carotid diameter as the denominator. CONTRAST:  50 mL Isovue 370 COMPARISON:  [employer] Head CT without contrast 01/11/2014, and earlier FINDINGS: Skeleton: Intermittent poor dentition. Straightening of cervical lordosis. No acute osseous abnormality identified. Visualized paranasal sinuses and mastoids are well pneumatized. Other neck: Negative lung apices. No superior mediastinal lymphadenopathy. Negative thyroid, larynx, pharynx, parapharyngeal spaces, retropharyngeal space, sublingual space, submandibular glands, and parotid glands. No cervical lymphadenopathy. Aortic arch: 4 vessel arch configuration, the left vertebral artery arises directly off the arch but is heavily diseased as detailed below. Calcified arch atherosclerosis. Right carotid system: Soft plaque in the right brachiocephalic artery and right CCA origin without hemodynamically significant stenosis. Somewhat bulky soft plaque along the ventral right CCA (series 401, image 41) without hemodynamically significant stenosis. Bulky mostly soft plaque occupying the right ICA origin and bulb resulting in high-grade radiographic string sign stenosis over a segment of about 8 mm as seen on series 401, image 60, series 404, image 72, and series 406, image 13). Despite  this the right ICA remains patent, and appears otherwise  negative to the skullbase. The visible right ICA siphon is patent and negative. Left carotid system: Soft plaque at the left CCA origin with less than 50% stenosis with respect to the distal vessel. Circumferential soft plaque in the left CCA proximal to the bifurcation without stenosis. Soft and calcified plaque at the left carotid bifurcation and involving the left ICA origin and bulb, but no subsequent stenosis. Negative cervical left ICA otherwise. Visible left ICA siphon is patent but with moderate calcified plaque (series 401, image 102). Vertebral arteries: Soft plaque at the right subclavian artery origin resulting in up to 50% stenosis. Right vertebral artery origin is mildly stenotic due to soft plaque. The right vertebral artery is dominant. There is a mild stenosis in the distal V1 segment as seen on series 403, image 134. Otherwise the right vertebral artery is normal to the skullbase. Intracranially however there is severe stenosis of the right V4 segment at the PICA origin (series 401, image 85). Despite this the distal right vertebral remains patent to the junction with the basilar. The visible basilar artery is negative. The left vertebral artery arises directly from the arch, but is severely diseased and appears occluded in the V1 segment. Flow is reconstituted in the left V2 segment beginning at the C5 level, but the left vertebral artery is diminutive until the C2 level. There is then mild irregularity and stenosis in the left V3 segment, and moderate to severe stenosis in the proximal left V4 segment (series 402, image 164. There is calcified plaque near the left PICA origin which remains patent. There is mild to moderate irregularity and mild stenosis at the left vertebrobasilar junction. IMPRESSION: 1. Very age advanced atherosclerosis. Bulky soft plaque resulting in RADIOGRAPHIC STRING SIGN stenosis of the right ICA bulb over a segment of about 8 mm. 2. Widespread left carotid atherosclerosis  but no hemodynamically significant stenosis. Left ICA siphon calcified plaque partially visible. 3. Significant posterior circulation atherosclerosis and stenosis: The left vertebral artery arises directly from the arch, but its origin and the left V1 segment are occluded. Flow is reconstituted in the left V2 segment. Mild right vertebral artery origin stenosis, but extensive bilateral V4 vertebral atherosclerosis with moderate severe stenosis. Despite this the vertebrobasilar junction remains patent. Electronically Signed   By: Genevie Ann M.D.   On: 06/30/2016 15:27   Ct Angio Chest/abd/pel For Dissection W And/or W/wo  06/28/2016  EXAM: CT ANGIOGRAPHY CHEST, ABDOMEN, PELVIS WITH CONTRAST TECHNIQUE: Multidetector CT imaging of the chest, abdomen, pelvis was performed using the standard protocol during bolus administration of intravenous contrast. Multiplanar CT image reconstructions and MIPs were obtained to evaluate the vascular anatomy. CONTRAST:  100 mL Isovue 370 IV COMPARISON:  05/28/2009 FINDINGS: CHEST Vascular: Left arm IV contrast administration. Innominate vein and SVC are patent. Right atrium and right ventricle are nondilated. Satisfactory opacification of pulmonary arteries noted, and there is no evidence of pulmonary emboli. Patent bilateral pulmonary veins drain into the left atrium. Scattered coronary calcifications most evident on the noncontrast scan. Adequate contrast opacification of the thoracic aorta with no evidence of dissection, aneurysm, or stenosis. There is patent brachiocephalic arch anatomy without proximal stenosis. Partially calcified atheromatous plaque in the aortic arch and descending segment. Mediastinum/Lymph Nodes: No masses or pathologically enlarged lymph nodes identified. No pericardial effusion. Lungs/Pleura: 42mm noncalcified nodule, superior segment right lower lobe image 20/3. Subsegmental dependent atelectasis or scarring posteriorly in both lower lobes. No pleural  effusion. No pneumothorax. Musculoskeletal: No chest wall mass or suspicious bone lesions identified. ABDOMEN Arterial Aorta: Moderate atheromatous plaque particularly in the infrarenal segment, without aneurysm, dissection, or stenosis. Celiac axis:          Patent Superior mesenteric:  Patent Left renal: Minimal ostial plaque, nonocclusive. Patent distally. Right renal:          Patent Inferior mesenteric:  Patent Left iliac: Partially calcified plaque through the common and internal iliac arteries without high-grade stenosis, aneurysm or dissection. External iliac patent. Common femoral patent. Right iliac: Mild common iliac and proximal internal iliac plaque. No aneurysm, dissection, or high-grade stenosis. Venous Dedicated venous phase imaging not obtained. Patent bilateral renal veins, portal vein, splenic vein. Review of the MIP images confirms the above findings. Nonvasular Hepatobiliary: No masses or other significant abnormality. Pancreas: No mass, inflammatory changes, or other significant abnormality. Spleen: Within normal limits in size and appearance. Adrenals/Urinary Tract: No masses identified. No evidence of hydronephrosis. Stomach/Bowel: No evidence of obstruction, inflammatory process, or abnormal fluid collections. Normal appendix. Scattered diverticula, most numerous in the distal descending segment, without significant adjacent inflammatory/edematous change. Lymphatic: No pathologically enlarged lymph nodes. Reproductive: No mass or other significant abnormality. Other: No ascites.  No free air. Musculoskeletal: Obesity. Stable discogenic sclerosis adjacent to the inferior plate of L4. Negative for fracture or other acute bone lesion. Subcutaneous foci of gas and fluid in the anterior right lower quadrant and left lower quadrant body wall presumably related to recent injections. Tiny umbilical hernia containing only mesenteric fat. IMPRESSION: 1. Negative for aortic dissection or pulmonary  emboli. 2. 6 mm nonspecific nodule, superior segment right lower lobe. Non-contrast chest CT at 6-12 months is recommended. If the nodule is stable at time of repeat CT, then future CT at 18-24 months (from today's scan) is considered optional for low-risk patients, but is recommended for high-risk patients. This recommendation follows the consensus statement: Guidelines for Management of Incidental Pulmonary Nodules Detected on CT Images:From the Fleischner Society 2017; published online before print (10.1148/radiol.IJ:2314499). 3. Atherosclerosis, including Aortic Atherosclerosis (ICD10-170.0), iliac and coronary artery disease. Please note that although the presence of coronary artery calcium documents the presence of coronary artery disease, the severity of this disease and any potential stenosis cannot be assessed on this non-gated CT examination. Assessment for potential risk factor modification, dietary therapy or pharmacologic therapy may be warranted, if clinically indicated. 4. Colonic diverticulosis. Electronically Signed   By: Lucrezia Europe M.D.   On: 06/28/2016 12:40   Anti-infectives: Anti-infectives    None      Assessment/Plan: s/p Procedure(s): CORONARY ARTERY BYPASS GRAFTING (CABG) ([name]) TRANSESOPHAGEAL ECHOCARDIOGRAM (TEE) ([name]) ENDARTERECTOMY CAROTID (Right) Again discussed in length plan for combined right carotid endarterectomy by myself and coronary artery bypass grafting with Dr. Nils Pyle tomorrow.'s custody the 1-2% risk of stroke with carotid surgery plus whatever wrist there would be with coronary bypass grafting. Understands and wishes to proceed as planned.   LOS: 6 days   Curt Jews 07/05/2016, 8:27 AM

## 2016-07-05 NOTE — Progress Notes (Addendum)
PATIENT ID: Erika [name] is a 89F with poorly-controlled DM (A1c 10.4%), poorly-controlled hypertension, ongoing tobacco abuse and non-compliance here with NSTEMI and found to have 3 vessel CAD.  Planned for CABG on 7/14 with Dr. Prescott Gum.  SUBJECTIVE:  Feels well.  Notes some dizziness intermittently.    PHYSICAL EXAM Filed Vitals:   07/04/16 2058 07/05/16 0002 07/05/16 0600 07/05/16 0755  BP: 140/81 115/69  141/71  Pulse: 56 48  53  Temp: 98.6 F (37 C) 98 F (36.7 C)    TempSrc: Oral Oral    Resp:    16  Height:      Weight:   208 lb 15.9 oz (94.8 kg)   SpO2: 96% 93%  98%   General:  Well-appearing.  No acute distress Neck: No JVD Lungs:  CTAB.  No crackles, rhonchi or wheezes Heart:  Bradycardic.  Regular rhythm.  No m/r/g.  Normal S1/S2 Abdomen:  Soft, NT, ND.  +BS Extremities:  No edema   LABS: Lab Results  Component Value Date   TROPONINI 0.82* 06/28/2016   Results for orders placed or performed during the hospital encounter of 06/29/16 (from the past 24 hour(s))  Blood gas, arterial     Status: Abnormal   Collection Time: 07/04/16 10:00 AM  Result Value Ref Range   FIO2 0.21    Delivery systems ROOM AIR    pH, Arterial 7.414 7.350 - 7.450   pCO2 arterial 47.0 (H) 35.0 - 45.0 mmHg   pO2, Arterial 68.4 (L) 80.0 - 100.0 mmHg   Bicarbonate 29.5 (H) 20.0 - 24.0 mEq/L   TCO2 30.9 0 - 100 mmol/L   Acid-Base Excess 5.1 (H) 0.0 - 2.0 mmol/L   O2 Saturation 93.1 %   Patient temperature 98.6    Collection site RIGHT RADIAL    Drawn by [id number]    Sample type ARTERIAL DRAW    Allens test (pass/fail) PASS PASS  Glucose, capillary     Status: Abnormal   Collection Time: 07/04/16 11:54 AM  Result Value Ref Range   Glucose-Capillary 142 (H) 65 - 99 mg/dL   Comment 1 Notify RN   Glucose, capillary     Status: Abnormal   Collection Time: 07/04/16  5:23 PM  Result Value Ref Range   Glucose-Capillary 114 (H) 65 - 99 mg/dL   Comment 1 Notify RN   Glucose, capillary      Status: Abnormal   Collection Time: 07/04/16  9:05 PM  Result Value Ref Range   Glucose-Capillary 153 (H) 65 - 99 mg/dL   Comment 1 Capillary Specimen   Heparin level (unfractionated)     Status: None   Collection Time: 07/05/16  3:53 AM  Result Value Ref Range   Heparin Unfractionated 0.47 0.30 - 0.70 IU/mL  CBC     Status: Abnormal   Collection Time: 07/05/16  3:54 AM  Result Value Ref Range   WBC 12.7 (H) 4.0 - 10.5 K/uL   RBC 4.34 3.87 - 5.11 MIL/uL   Hemoglobin 13.0 12.0 - 15.0 g/dL   HCT 39.7 36.0 - 46.0 %   MCV 91.5 78.0 - 100.0 fL   MCH 30.0 26.0 - 34.0 pg   MCHC 32.7 30.0 - 36.0 g/dL   RDW 13.2 11.5 - 15.5 %   Platelets 385 150 - 400 K/uL  Comprehensive metabolic panel     Status: Abnormal   Collection Time: 07/05/16  3:54 AM  Result Value Ref Range   Sodium 137 135 -  145 mmol/L   Potassium 4.1 3.5 - 5.1 mmol/L   Chloride 99 (L) 101 - 111 mmol/L   CO2 29 22 - 32 mmol/L   Glucose, Bld 108 (H) 65 - 99 mg/dL   BUN 8 6 - 20 mg/dL   Creatinine, Ser 0.84 0.44 - 1.00 mg/dL   Calcium 10.3 8.9 - 10.3 mg/dL   Total Protein 7.0 6.5 - 8.1 g/dL   Albumin 3.4 (L) 3.5 - 5.0 g/dL   AST 91 (H) 15 - 41 U/L   ALT 116 (H) 14 - 54 U/L   Alkaline Phosphatase 166 (H) 38 - 126 U/L   Total Bilirubin 0.4 0.3 - 1.2 mg/dL   GFR calc non Af Amer >60 >60 mL/min   GFR calc Af Amer >60 >60 mL/min   Anion gap 9 5 - 15    Intake/Output Summary (Last 24 hours) at 07/05/16 0838 Last data filed at 07/05/16 0800  Gross per 24 hour  Intake   1308 ml  Output   2750 ml  Net  -1442 ml    Telemetry:  Bradycardia down to 20s.  Up to 3.9 s pauses overnight.  Mostly rates 45-60.  Most Recent Cardiac Studies: Cardiac catheterization 07/28/16: Coronary Findings    Dominance: Co-dominant   Left Main   . Ost LM to LM lesion, 40% stenosed.     Left Anterior Descending   . Ost LAD lesion, 40% stenosed.   . Prox LAD lesion, 95% stenosed.   . Second Diagonal Branch   .  2nd Diag lesion, 50% stenosed.     Left Circumflex   . Ost Cx to Prox Cx lesion, 60% stenosed. Severely Calcified diffuse.   . Prox Cx lesion, 95% stenosed.   . Mid Cx lesion, 60% stenosed.     Right Coronary Artery   . Mid RCA lesion, 95% stenosed.      Wall Motion                 Left Heart    Left Ventricle The left ventricular size is normal. The left ventricular systolic function is normal. There are wall motion abnormalities in the left ventricle. There are segmental wall motion abnormalities in the left ventricle. Mild inferior wall hypokinesis   Mitral Valve There is no mitral valve stenosis, no mitral valve regurgitation and no mitral valve prolapse evident. The annulus is normal.   Aortic Valve There is no aortic valve stenosis. No calcification found in the aortic valve. There is normal aortic valve motion.   Aorta The size of the ascending aorta is normal.    Coronary Diagrams    Diagnostic Diagram         Case discussed with Dr. Fletcher Anon. Initially was unclear if left main was severe and degree of stenosis of the mid LAD was not appreciated. Dr. Fletcher Anon gave intercoronary nitroglycerin which clearly documented mid LAD lesion .   Recommendations:   Plan is to transfer to [facility] for consideration of bypass surgery. She will need medical risk factor modification. Poor control diabetes, hyperlipidemia, smoker. Morbid obesity Needs significant lifestyle changes  Echo 06/28/2016: Study Conclusions - Left ventricle: The cavity size was normal. There was mild  concentric hypertrophy. Systolic function was normal. The  estimated ejection fraction was in the range of 50% to 55%.  Select image suggestive of mild inferior wall hypokinesis.  Doppler parameters are consistent with abnormal left ventricular  relaxation (grade 1 diastolic dysfunction). - Mitral valve: There was mild  regurgitation. - Left  atrium: The atrium was normal in size. - Right ventricle: Systolic function was normal. - Pulmonary arteries: Systolic pressure was within the normal  range.         ASSESSMENT AND PLAN:  Principal Problem:   NSTEMI (non-ST elevated myocardial infarction) ([place]) Active Problems:   HLD (hyperlipidemia)   ADJUSTMENT DISORDER WITH MIXED FEATURES   HTN (hypertension), malignant   Agoraphobia with panic attacks   MDD (major depressive disorder) (HCC)   Tobacco abuse   Uncontrolled type 2 diabetes mellitus with complication, without long-term current use of insulin (HCC)   CAD in native artery   Metabolic syndrome   Bradycardia   CAD (coronary artery disease)   Carotid stenosis   # 3 Vessel CAD: # NSTEMI: # R Carotid stenosis: Scheduled for CABG and R carotid endarterectomy tomorrow.  Currently chest pain free.  Continue heparin, aspirin, and atorvastatin.  Continue Imdur 30 mg daily.     # Hypertensive heart disease: Patient has not been receiving carvedilol due to bradycardia.  Will discontinue.  BP starting to creep up.  Will add enalapril 10 mg qhs.   Continue enalapril 20 mg daily and spironolactone.  Her home HCTZ was held on admission.    # Bradycardia: Erika [name] is having episodes of bradycardia down to the 20s while sleeping and pauses up to 3.9s.  Last dose of carvedilol was 7/11 at 5pm.  Given that she will be having anesthesia tomorrow and her carotid will be operated on, which can both trigger vagal response, we will make her NPO with plans for a temp wire later today.  Please do not place in R IJ given that she will need R CEA.  Addendum: Spoke with Dr. Rayann Heman of EP who feels that the patient can go through with surgery safely without pre-procedure temporary pacing.  Suggests considering dopamine with induction of anesthesia if deemed necessary.  # Hyperlipidemia:  Triglycerides were 436 this admission.  This is at least in part due to poor glycemic control.     Unable to calculate LDL.  Simvastatin was switched to atorvastatin this admission.    # Diabetes mellitus type 2: Very poorly-controlled.  Hemoglobin A1c is 10.4%.  Home metformin has been held.  Accuchecks are <200.  Continue SSI.  Appreciate diabetes coordinator.  # Tobacco abuse: Nicotine patch.  Continued to encourage smoking cessation.   # Depression, PTSD, panic disorder: Continue Prozac, Klonopin and Lamictal.   # Leukocytosis: Chronic.  U/A negative this admission.    Erika Perea C. Oval Linsey, MD, [facility] 07/05/2016 8:38 AM

## 2016-07-05 NOTE — Progress Notes (Signed)
ANTICOAGULATION CONSULT NOTE - Follow Up Consult  Pharmacy Consult for Heparin Indication: severe multi-vessel CAD awaiting CABG  Allergies  Allergen Reactions  . No Known Allergies     Patient Measurements: Height: 5\' 7"  (170.2 cm) Weight: 208 lb 15.9 oz (94.8 kg) IBW/kg (Calculated) : 61.6 Heparin Dosing Weight: 83kg  Vital Signs: Temp: 98 F (36.7 C) (07/13 0002) Temp Source: Oral (07/13 0002) BP: 141/71 mmHg (07/13 0755) Pulse Rate: 53 (07/13 0755)  Labs:  Recent Labs  07/03/16 0350 07/03/16 0358 07/04/16 0253 07/05/16 0353 07/05/16 0354  HGB 12.4  --  13.7  --  13.0  HCT 38.9  --  42.2  --  39.7  PLT 356  --  427*  --  385  HEPARINUNFRC  --  0.45 0.44 0.47  --   CREATININE  --   --  0.86  --  0.84    Estimated Creatinine Clearance: 103.2 mL/min (by C-G formula based on Cr of 0.84).   Medications:  Heparin @ 1950 units/hr  Assessment: 44yof continues on heparin for severe multivessel CAD awaiting CABG and right carotid endarterectomy on 7/14. Heparin level is therapeutic at 0.47. CBC is stable. No bleeding.  Goal of Therapy:  Heparin level 0.3-0.7 units/ml Monitor platelets by anticoagulation protocol: Yes   Plan:  1) Continue heparin at 1950 units/hr 2) Follow up after CABG tomorrow  Deboraha Sprang 07/05/2016,8:31 AM

## 2016-07-06 ENCOUNTER — Inpatient Hospital Stay (HOSPITAL_COMMUNITY): Payer: BLUE CROSS/BLUE SHIELD | Admitting: Anesthesiology

## 2016-07-06 ENCOUNTER — Other Ambulatory Visit (HOSPITAL_COMMUNITY): Payer: Self-pay

## 2016-07-06 ENCOUNTER — Encounter (HOSPITAL_COMMUNITY)
Admission: AD | Disposition: A | Discharge status: Home / Self Care | Payer: Self-pay | Source: Other Acute Inpatient Hospital | Attending: Cardiovascular Disease

## 2016-07-06 ENCOUNTER — Inpatient Hospital Stay (HOSPITAL_COMMUNITY): Payer: BLUE CROSS/BLUE SHIELD

## 2016-07-06 DIAGNOSIS — I6521 Occlusion and stenosis of right carotid artery: Secondary | ICD-10-CM

## 2016-07-06 DIAGNOSIS — Z951 Presence of aortocoronary bypass graft: Secondary | ICD-10-CM

## 2016-07-06 HISTORY — PX: CORONARY ARTERY BYPASS GRAFT: SHX141

## 2016-07-06 HISTORY — PX: TEE WITHOUT CARDIOVERSION: SHX5443

## 2016-07-06 HISTORY — PX: ENDARTERECTOMY: SHX5162

## 2016-07-06 LAB — HEMOGLOBIN AND HEMATOCRIT, BLOOD
HCT: 30 % — ABNORMAL LOW (ref 36.0–46.0)
Hemoglobin: 9.7 g/dL — ABNORMAL LOW (ref 12.0–15.0)

## 2016-07-06 LAB — POCT I-STAT, CHEM 8
BUN: 6 mg/dL (ref 6–20)
CALCIUM ION: 1.31 mmol/L — AB (ref 1.13–1.30)
Chloride: 99 mmol/L — ABNORMAL LOW (ref 101–111)
Creatinine, Ser: 0.7 mg/dL (ref 0.44–1.00)
GLUCOSE: 125 mg/dL — AB (ref 65–99)
HCT: 27 % — ABNORMAL LOW (ref 36.0–46.0)
HEMOGLOBIN: 9.2 g/dL — AB (ref 12.0–15.0)
Potassium: 4.3 mmol/L (ref 3.5–5.1)
Sodium: 138 mmol/L (ref 135–145)
TCO2: 26 mmol/L (ref 0–100)

## 2016-07-06 LAB — PROTIME-INR
INR: 1.35 (ref 0.00–1.49)
Prothrombin Time: 16.8 seconds — ABNORMAL HIGH (ref 11.6–15.2)

## 2016-07-06 LAB — BASIC METABOLIC PANEL
ANION GAP: 11 (ref 5–15)
BUN: 8 mg/dL (ref 6–20)
CALCIUM: 10.8 mg/dL — AB (ref 8.9–10.3)
CHLORIDE: 95 mmol/L — AB (ref 101–111)
CO2: 30 mmol/L (ref 22–32)
Creatinine, Ser: 0.9 mg/dL (ref 0.44–1.00)
GFR calc non Af Amer: >60 mL/min (ref >60)
Glucose, Bld: 132 mg/dL — ABNORMAL HIGH (ref 65–99)
POTASSIUM: 4.4 mmol/L (ref 3.5–5.1)
Sodium: 136 mmol/L (ref 135–145)

## 2016-07-06 LAB — CBC
HCT: 44.6 % (ref 36.0–46.0)
HCT: 28.4 % — ABNORMAL LOW (ref 36.0–46.0)
HEMATOCRIT: 34.1 % — AB (ref 36.0–46.0)
HEMOGLOBIN: 14.4 g/dL (ref 12.0–15.0)
Hemoglobin: 11.3 g/dL — ABNORMAL LOW (ref 12.0–15.0)
Hemoglobin: 9.2 g/dL — ABNORMAL LOW (ref 12.0–15.0)
MCH: 29.8 pg (ref 26.0–34.0)
MCH: 30.1 pg (ref 26.0–34.0)
MCH: 29.3 pg (ref 26.0–34.0)
MCHC: 32.3 g/dL (ref 30.0–36.0)
MCHC: 33.1 g/dL (ref 30.0–36.0)
MCHC: 32.4 g/dL (ref 30.0–36.0)
MCV: 92.3 fL (ref 78.0–100.0)
MCV: 90.9 fL (ref 78.0–100.0)
MCV: 90.4 fL (ref 78.0–100.0)
PLATELETS: 296 10*3/uL (ref 150–400)
Platelets: 461 10*3/uL — ABNORMAL HIGH (ref 150–400)
Platelets: 265 10*3/uL (ref 150–400)
RBC: 4.83 MIL/uL (ref 3.87–5.11)
RBC: 3.75 MIL/uL — AB (ref 3.87–5.11)
RBC: 3.14 MIL/uL — ABNORMAL LOW (ref 3.87–5.11)
RDW: 13.2 % (ref 11.5–15.5)
RDW: 13.4 % (ref 11.5–15.5)
RDW: 13.2 % (ref 11.5–15.5)
WBC: 14.5 10*3/uL — ABNORMAL HIGH (ref 4.0–10.5)
WBC: 18.4 10*3/uL — AB (ref 4.0–10.5)
WBC: 15.8 10*3/uL — ABNORMAL HIGH (ref 4.0–10.5)

## 2016-07-06 LAB — POCT I-STAT 3, ART BLOOD GAS (G3+)
ACID-BASE DEFICIT: 1 mmol/L (ref 0.0–2.0)
ACID-BASE DEFICIT: 1 mmol/L (ref 0.0–2.0)
Bicarbonate: 26.2 mEq/L — ABNORMAL HIGH (ref 20.0–24.0)
Bicarbonate: 25.4 mEq/L — ABNORMAL HIGH (ref 20.0–24.0)
Bicarbonate: 24.9 mEq/L — ABNORMAL HIGH (ref 20.0–24.0)
O2 SAT: 89 %
O2 SAT: 89 %
O2 Saturation: 93 %
PO2 ART: 73 mmHg — AB (ref 80.0–100.0)
Patient temperature: 36.8
TCO2: 28 mmol/L (ref 0–100)
TCO2: 27 mmol/L (ref 0–100)
TCO2: 26 mmol/L (ref 0–100)
pCO2 arterial: 50.8 mmHg — ABNORMAL HIGH (ref 35.0–45.0)
pCO2 arterial: 43.6 mmHg (ref 35.0–45.0)
pCO2 arterial: 43.6 mmHg (ref 35.0–45.0)
pH, Arterial: 7.318 — ABNORMAL LOW (ref 7.350–7.450)
pH, Arterial: 7.373 (ref 7.350–7.450)
pH, Arterial: 7.364 (ref 7.350–7.450)
pO2, Arterial: 58 mmHg — ABNORMAL LOW (ref 80.0–100.0)
pO2, Arterial: 59 mmHg — ABNORMAL LOW (ref 80.0–100.0)

## 2016-07-06 LAB — POCT I-STAT 4, (NA,K, GLUC, HGB,HCT)
Glucose, Bld: 152 mg/dL — ABNORMAL HIGH (ref 65–99)
HCT: 33 % — ABNORMAL LOW (ref 36.0–46.0)
Hemoglobin: 11.2 g/dL — ABNORMAL LOW (ref 12.0–15.0)
POTASSIUM: 4.2 mmol/L (ref 3.5–5.1)
SODIUM: 138 mmol/L (ref 135–145)

## 2016-07-06 LAB — APTT: APTT: 33 s (ref 24–37)

## 2016-07-06 LAB — PLATELET COUNT: Platelets: 401 10*3/uL — ABNORMAL HIGH (ref 150–400)

## 2016-07-06 LAB — GLUCOSE, CAPILLARY: Glucose-Capillary: 149 mg/dL — ABNORMAL HIGH (ref 65–99)

## 2016-07-06 SURGERY — CORONARY ARTERY BYPASS GRAFTING (CABG)
Anesthesia: General | Site: Chest | Laterality: Right

## 2016-07-06 MED ORDER — PHENYLEPHRINE HCL 10 MG/ML IJ SOLN
INTRAMUSCULAR | Status: AC
  Filled 2016-07-06: qty 1

## 2016-07-06 MED ORDER — LIDOCAINE HCL (CARDIAC) 20 MG/ML IV SOLN
INTRAVENOUS | Status: DC | PRN
  Administered 2016-07-06: 50 mg via INTRAVENOUS

## 2016-07-06 MED ORDER — LACTATED RINGERS IV SOLN: INTRAVENOUS | Status: DC

## 2016-07-06 MED ORDER — ACETAMINOPHEN 160 MG/5ML PO SOLN
1000.0000 mg | Freq: Four times a day (QID) | ORAL | Status: DC
  Administered 2016-07-07 (×2): 1000 mg
  Filled 2016-07-06 (×2): qty 40.6

## 2016-07-06 MED ORDER — METOPROLOL TARTRATE 5 MG/5ML IV SOLN
2.5000 mg | INTRAVENOUS | Status: DC | PRN
  Administered 2016-07-07: 5 mg via INTRAVENOUS
  Filled 2016-07-06: qty 5

## 2016-07-06 MED ORDER — MIDAZOLAM HCL 10 MG/2ML IJ SOLN
INTRAMUSCULAR | Status: AC
  Filled 2016-07-06: qty 2

## 2016-07-06 MED ORDER — MILRINONE LACTATE IN DEXTROSE 20-5 MG/100ML-% IV SOLN
0.0000 ug/kg/min | INTRAVENOUS | Status: DC
  Administered 2016-07-07 (×2): 0.3 ug/kg/min via INTRAVENOUS
  Filled 2016-07-06 (×2): qty 100

## 2016-07-06 MED ORDER — DEXTROSE 5 % IV SOLN
1.5000 g | INTRAVENOUS | Status: DC | PRN
  Administered 2016-07-06: 1.5 g via INTRAVENOUS
  Administered 2016-07-06: .75 g via INTRAVENOUS

## 2016-07-06 MED ORDER — DOPAMINE-DEXTROSE 3.2-5 MG/ML-% IV SOLN: 0.0000 ug/kg/min | INTRAVENOUS | Status: DC

## 2016-07-06 MED ORDER — LACTATED RINGERS IV SOLN
INTRAVENOUS | Status: DC | PRN
  Administered 2016-07-06 (×2): via INTRAVENOUS

## 2016-07-06 MED ORDER — SODIUM CHLORIDE 0.9 % IV SOLN
10.0000 mg | INTRAVENOUS | Status: DC | PRN
  Administered 2016-07-06: 35 ug/min via INTRAVENOUS
  Administered 2016-07-06 (×2): via INTRAVENOUS

## 2016-07-06 MED ORDER — PROPOFOL 10 MG/ML IV BOLUS
INTRAVENOUS | Status: DC | PRN
  Administered 2016-07-06: 70 mg via INTRAVENOUS

## 2016-07-06 MED ORDER — SODIUM CHLORIDE 0.9 % IV SOLN
0.5000 g/h | Freq: Once | INTRAVENOUS | Status: DC
  Filled 2016-07-06: qty 20

## 2016-07-06 MED ORDER — INSULIN REGULAR BOLUS VIA INFUSION
0.0000 [IU] | Freq: Three times a day (TID) | INTRAVENOUS | Status: DC
  Filled 2016-07-06: qty 10

## 2016-07-06 MED ORDER — SODIUM CHLORIDE 0.9 % IJ SOLN
INTRAMUSCULAR | Status: AC
  Filled 2016-07-06: qty 10

## 2016-07-06 MED ORDER — PHENYLEPHRINE 40 MCG/ML (10ML) SYRINGE FOR IV PUSH (FOR BLOOD PRESSURE SUPPORT)
PREFILLED_SYRINGE | INTRAVENOUS | Status: AC
  Filled 2016-07-06: qty 10

## 2016-07-06 MED ORDER — PHENYLEPHRINE 40 MCG/ML (10ML) SYRINGE FOR IV PUSH (FOR BLOOD PRESSURE SUPPORT)
PREFILLED_SYRINGE | INTRAVENOUS | Status: AC
  Filled 2016-07-06: qty 20

## 2016-07-06 MED ORDER — ROCURONIUM BROMIDE 50 MG/5ML IV SOLN
INTRAVENOUS | Status: AC
Start: 2016-07-06 — End: 2016-07-06
  Filled 2016-07-06: qty 1

## 2016-07-06 MED ORDER — METOCLOPRAMIDE HCL 5 MG/ML IJ SOLN
10.0000 mg | Freq: Four times a day (QID) | INTRAMUSCULAR | Status: AC
Start: 2016-07-06 — End: 2016-07-07
  Administered 2016-07-06 – 2016-07-07 (×4): 10 mg via INTRAVENOUS
  Filled 2016-07-06 (×3): qty 2

## 2016-07-06 MED ORDER — BISACODYL 5 MG PO TBEC
10.0000 mg | DELAYED_RELEASE_TABLET | Freq: Every day | ORAL | Status: DC
  Administered 2016-07-07 – 2016-07-12 (×5): 10 mg via ORAL
  Filled 2016-07-06 (×7): qty 2

## 2016-07-06 MED ORDER — LEVALBUTEROL HCL 1.25 MG/0.5ML IN NEBU
1.2500 mg | INHALATION_SOLUTION | Freq: Four times a day (QID) | RESPIRATORY_TRACT | Status: DC
  Administered 2016-07-06 – 2016-07-07 (×3): 1.25 mg via RESPIRATORY_TRACT
  Filled 2016-07-06 (×3): qty 0.5

## 2016-07-06 MED ORDER — HEPARIN SODIUM (PORCINE) 1000 UNIT/ML IJ SOLN
INTRAMUSCULAR | Status: AC
  Filled 2016-07-06: qty 1

## 2016-07-06 MED ORDER — PROPOFOL 10 MG/ML IV BOLUS
INTRAVENOUS | Status: AC
  Filled 2016-07-06: qty 20

## 2016-07-06 MED ORDER — FAMOTIDINE IN NACL 20-0.9 MG/50ML-% IV SOLN
20.0000 mg | Freq: Two times a day (BID) | INTRAVENOUS | Status: AC
  Administered 2016-07-07 (×2): 20 mg via INTRAVENOUS
  Filled 2016-07-06 (×2): qty 50

## 2016-07-06 MED ORDER — ROCURONIUM BROMIDE 100 MG/10ML IV SOLN
INTRAVENOUS | Status: DC | PRN
  Administered 2016-07-06: 100 mg via INTRAVENOUS
  Administered 2016-07-06 (×2): 50 mg via INTRAVENOUS

## 2016-07-06 MED ORDER — MORPHINE SULFATE (PF) 2 MG/ML IV SOLN
1.0000 mg | INTRAVENOUS | Status: DC | PRN
  Administered 2016-07-06: 1 mg via INTRAVENOUS
  Administered 2016-07-06: 2 mg via INTRAVENOUS
  Administered 2016-07-07 (×2): 1 mg via INTRAVENOUS
  Filled 2016-07-06 (×3): qty 1

## 2016-07-06 MED ORDER — MIDAZOLAM HCL 5 MG/5ML IJ SOLN
INTRAMUSCULAR | Status: DC | PRN
  Administered 2016-07-06: 2 mg via INTRAVENOUS
  Administered 2016-07-06: 1 mg via INTRAVENOUS
  Administered 2016-07-06 (×2): 2 mg via INTRAVENOUS
  Administered 2016-07-06: 3 mg via INTRAVENOUS
  Administered 2016-07-06 (×2): 4 mg via INTRAVENOUS
  Administered 2016-07-06: 2 mg via INTRAVENOUS

## 2016-07-06 MED ORDER — ALBUMIN HUMAN 5 % IV SOLN
250.0000 mL | INTRAVENOUS | Status: AC | PRN
  Administered 2016-07-06 (×3): 250 mL via INTRAVENOUS
  Filled 2016-07-06: qty 250

## 2016-07-06 MED ORDER — ASPIRIN 81 MG PO CHEW: 324.0000 mg | CHEWABLE_TABLET | Freq: Every day | ORAL | Status: DC

## 2016-07-06 MED ORDER — CHLORHEXIDINE GLUCONATE 0.12 % MT SOLN
15.0000 mL | OROMUCOSAL | Status: AC
  Administered 2016-07-06: 15 mL via OROMUCOSAL

## 2016-07-06 MED ORDER — ACETAMINOPHEN 500 MG PO TABS
1000.0000 mg | ORAL_TABLET | Freq: Four times a day (QID) | ORAL | Status: AC
  Administered 2016-07-07 – 2016-07-10 (×4): 1000 mg via ORAL
  Filled 2016-07-06 (×6): qty 2

## 2016-07-06 MED ORDER — SODIUM CHLORIDE 0.9 % IV SOLN
250.0000 mL | INTRAVENOUS | Status: DC
  Administered 2016-07-07: 250 mL via INTRAVENOUS

## 2016-07-06 MED ORDER — FENTANYL CITRATE (PF) 100 MCG/2ML IJ SOLN
INTRAMUSCULAR | Status: DC | PRN
  Administered 2016-07-06 (×2): 250 ug via INTRAVENOUS
  Administered 2016-07-06: 50 ug via INTRAVENOUS
  Administered 2016-07-06: 500 ug via INTRAVENOUS
  Administered 2016-07-06: 250 ug via INTRAVENOUS
  Administered 2016-07-06: 150 ug via INTRAVENOUS
  Administered 2016-07-06: 50 ug via INTRAVENOUS

## 2016-07-06 MED ORDER — MIDAZOLAM HCL 2 MG/2ML IJ SOLN
2.0000 mg | INTRAMUSCULAR | Status: DC | PRN
  Filled 2016-07-06: qty 2

## 2016-07-06 MED ORDER — ONDANSETRON HCL 4 MG/2ML IJ SOLN
4.0000 mg | Freq: Four times a day (QID) | INTRAMUSCULAR | Status: DC | PRN
  Administered 2016-07-07: 4 mg via INTRAVENOUS
  Filled 2016-07-06 (×2): qty 2

## 2016-07-06 MED ORDER — SODIUM CHLORIDE 0.9 % IV SOLN
INTRAVENOUS | Status: DC
  Administered 2016-07-06: 4.6 [IU]/h via INTRAVENOUS
  Filled 2016-07-06: qty 2.5

## 2016-07-06 MED ORDER — ARTIFICIAL TEARS OP OINT
TOPICAL_OINTMENT | OPHTHALMIC | Status: DC | PRN
  Administered 2016-07-06: 1 via OPHTHALMIC

## 2016-07-06 MED ORDER — SODIUM CHLORIDE 0.9 % IJ SOLN
OROMUCOSAL | Status: DC | PRN
  Administered 2016-07-06 (×3): 4 mL via TOPICAL

## 2016-07-06 MED ORDER — DEXMEDETOMIDINE HCL IN NACL 200 MCG/50ML IV SOLN
0.0000 ug/kg/h | INTRAVENOUS | Status: DC
  Administered 2016-07-06: 0.5 ug/kg/h via INTRAVENOUS
  Administered 2016-07-07: 0.3 ug/kg/h via INTRAVENOUS
  Filled 2016-07-06 (×4): qty 50

## 2016-07-06 MED ORDER — MORPHINE SULFATE (PF) 2 MG/ML IV SOLN
2.0000 mg | INTRAVENOUS | Status: DC | PRN
  Administered 2016-07-07: 2 mg via INTRAVENOUS
  Filled 2016-07-06: qty 1

## 2016-07-06 MED ORDER — OXYCODONE HCL 5 MG PO TABS
5.0000 mg | ORAL_TABLET | ORAL | Status: DC | PRN
  Administered 2016-07-07 (×3): 10 mg via ORAL
  Administered 2016-07-08: 5 mg via ORAL
  Administered 2016-07-08 (×3): 10 mg via ORAL
  Administered 2016-07-09: 5 mg via ORAL
  Administered 2016-07-09: 10 mg via ORAL
  Administered 2016-07-09 (×2): 5 mg via ORAL
  Administered 2016-07-09 (×2): 10 mg via ORAL
  Administered 2016-07-09 – 2016-07-10 (×3): 5 mg via ORAL
  Administered 2016-07-10 – 2016-07-13 (×15): 10 mg via ORAL
  Filled 2016-07-06: qty 1
  Filled 2016-07-06 (×5): qty 2
  Filled 2016-07-06: qty 1
  Filled 2016-07-06 (×4): qty 2
  Filled 2016-07-06 (×2): qty 1
  Filled 2016-07-06: qty 2
  Filled 2016-07-06: qty 1
  Filled 2016-07-06 (×15): qty 2

## 2016-07-06 MED ORDER — FENTANYL CITRATE (PF) 250 MCG/5ML IJ SOLN
INTRAMUSCULAR | Status: AC
  Filled 2016-07-06: qty 5

## 2016-07-06 MED ORDER — PHENYLEPHRINE HCL 10 MG/ML IJ SOLN
0.0000 ug/min | INTRAVENOUS | Status: DC
  Administered 2016-07-06: 40 ug/min via INTRAVENOUS
  Filled 2016-07-06 (×2): qty 2

## 2016-07-06 MED ORDER — CALCIUM CHLORIDE 10 % IV SOLN
INTRAVENOUS | Status: AC
  Filled 2016-07-06: qty 10

## 2016-07-06 MED ORDER — METOPROLOL TARTRATE 12.5 MG HALF TABLET
12.5000 mg | ORAL_TABLET | Freq: Two times a day (BID) | ORAL | Status: DC
  Administered 2016-07-07 – 2016-07-08 (×3): 12.5 mg via ORAL
  Filled 2016-07-06 (×3): qty 1

## 2016-07-06 MED ORDER — SODIUM CHLORIDE 0.9% FLUSH: 3.0000 mL | INTRAVENOUS | Status: DC | PRN

## 2016-07-06 MED ORDER — PROTAMINE SULFATE 10 MG/ML IV SOLN
INTRAVENOUS | Status: DC | PRN
  Administered 2016-07-06: 50 mg via INTRAVENOUS
  Administered 2016-07-06: 100 mg via INTRAVENOUS
  Administered 2016-07-06: 75 mg via INTRAVENOUS
  Administered 2016-07-06: 25 mg via INTRAVENOUS
  Administered 2016-07-06: 50 mg via INTRAVENOUS

## 2016-07-06 MED ORDER — ANTISEPTIC ORAL RINSE SOLUTION (CORINZ)
7.0000 mL | OROMUCOSAL | Status: DC
  Administered 2016-07-07 (×4): 7 mL via OROMUCOSAL

## 2016-07-06 MED ORDER — PROTAMINE SULFATE 10 MG/ML IV SOLN
INTRAVENOUS | Status: AC
  Filled 2016-07-06: qty 5

## 2016-07-06 MED ORDER — PAPAVERINE HCL 30 MG/ML IJ SOLN
INTRAMUSCULAR | Status: DC | PRN
  Administered 2016-07-06: 500 mL via INTRAVASCULAR

## 2016-07-06 MED ORDER — LACTATED RINGERS IV SOLN: 500.0000 mL | Freq: Once | INTRAVENOUS | Status: DC | PRN

## 2016-07-06 MED ORDER — ANTISEPTIC ORAL RINSE SOLUTION (CORINZ): 7.0000 mL | Freq: Four times a day (QID) | OROMUCOSAL | Status: DC

## 2016-07-06 MED ORDER — ACETAMINOPHEN 650 MG RE SUPP
650.0000 mg | Freq: Once | RECTAL | Status: AC
  Administered 2016-07-06: 650 mg via RECTAL

## 2016-07-06 MED ORDER — HEPARIN SODIUM (PORCINE) 1000 UNIT/ML IJ SOLN
INTRAMUSCULAR | Status: DC | PRN
  Administered 2016-07-06: 9000 [IU] via INTRAVENOUS
  Administered 2016-07-06: 34000 [IU] via INTRAVENOUS
  Administered 2016-07-06: 2000 [IU] via INTRAVENOUS

## 2016-07-06 MED ORDER — LIDOCAINE 2% (20 MG/ML) 5 ML SYRINGE
INTRAMUSCULAR | Status: AC
  Filled 2016-07-06: qty 5

## 2016-07-06 MED ORDER — MILRINONE LACTATE IN DEXTROSE 20-5 MG/100ML-% IV SOLN
0.1250 ug/kg/min | INTRAVENOUS | Status: DC
  Filled 2016-07-06: qty 100

## 2016-07-06 MED ORDER — CHLORHEXIDINE GLUCONATE 0.12% ORAL RINSE (MEDLINE KIT)
15.0000 mL | Freq: Two times a day (BID) | OROMUCOSAL | Status: DC
  Administered 2016-07-06: 15 mL via OROMUCOSAL

## 2016-07-06 MED ORDER — TRAMADOL HCL 50 MG PO TABS
50.0000 mg | ORAL_TABLET | ORAL | Status: DC | PRN
  Administered 2016-07-08: 100 mg via ORAL
  Administered 2016-07-09: 50 mg via ORAL
  Administered 2016-07-10: 100 mg via ORAL
  Administered 2016-07-10: 50 mg via ORAL
  Filled 2016-07-06: qty 1
  Filled 2016-07-06 (×2): qty 2
  Filled 2016-07-06: qty 1

## 2016-07-06 MED ORDER — VECURONIUM BROMIDE 10 MG IV SOLR
INTRAVENOUS | Status: DC | PRN
  Administered 2016-07-06 (×4): 5 mg via INTRAVENOUS

## 2016-07-06 MED ORDER — HEPARIN SODIUM (PORCINE) 1000 UNIT/ML IJ SOLN
INTRAMUSCULAR | Status: AC
  Filled 2016-07-06: qty 2

## 2016-07-06 MED ORDER — ASPIRIN EC 325 MG PO TBEC
325.0000 mg | DELAYED_RELEASE_TABLET | Freq: Every day | ORAL | Status: DC
Start: 2016-07-07 — End: 2016-07-08
  Administered 2016-07-07 – 2016-07-08 (×2): 325 mg via ORAL
  Filled 2016-07-06 (×2): qty 1

## 2016-07-06 MED ORDER — VANCOMYCIN HCL 1000 MG IV SOLR
1000.0000 mg | INTRAVENOUS | Status: DC | PRN
  Administered 2016-07-06: 1500 mg via INTRAVENOUS

## 2016-07-06 MED ORDER — 0.9 % SODIUM CHLORIDE (POUR BTL) OPTIME
TOPICAL | Status: DC | PRN
  Administered 2016-07-06: 6000 mL

## 2016-07-06 MED ORDER — LACTATED RINGERS IV SOLN
INTRAVENOUS | Status: DC | PRN
  Administered 2016-07-06 (×2): via INTRAVENOUS

## 2016-07-06 MED ORDER — BUDESONIDE 0.5 MG/2ML IN SUSP
0.5000 mg | Freq: Two times a day (BID) | RESPIRATORY_TRACT | Status: DC
  Administered 2016-07-06 – 2016-07-13 (×14): 0.5 mg via RESPIRATORY_TRACT
  Filled 2016-07-06 (×14): qty 2

## 2016-07-06 MED ORDER — DOCUSATE SODIUM 100 MG PO CAPS
200.0000 mg | ORAL_CAPSULE | Freq: Every day | ORAL | Status: DC
  Administered 2016-07-07 – 2016-07-12 (×4): 200 mg via ORAL
  Filled 2016-07-06 (×7): qty 2

## 2016-07-06 MED ORDER — CALCIUM CHLORIDE 10 % IV SOLN
INTRAVENOUS | Status: DC | PRN
  Administered 2016-07-06 (×2): 250 mg via INTRAVENOUS

## 2016-07-06 MED ORDER — SODIUM CHLORIDE 0.9 % IV SOLN
INTRAVENOUS | Status: DC | PRN
  Administered 2016-07-06: 500 mL

## 2016-07-06 MED ORDER — MILRINONE LACTATE IN DEXTROSE 20-5 MG/100ML-% IV SOLN
INTRAVENOUS | Status: DC | PRN
  Administered 2016-07-06: .3 ug/kg/min via INTRAVENOUS

## 2016-07-06 MED ORDER — VANCOMYCIN HCL IN DEXTROSE 1-5 GM/200ML-% IV SOLN
1000.0000 mg | Freq: Once | INTRAVENOUS | Status: AC
  Administered 2016-07-06: 1000 mg via INTRAVENOUS
  Filled 2016-07-06: qty 200

## 2016-07-06 MED ORDER — METOPROLOL TARTRATE 25 MG/10 ML ORAL SUSPENSION: 12.5000 mg | Freq: Two times a day (BID) | ORAL | Status: DC

## 2016-07-06 MED ORDER — FENTANYL CITRATE (PF) 250 MCG/5ML IJ SOLN
INTRAMUSCULAR | Status: AC
  Filled 2016-07-06: qty 25

## 2016-07-06 MED ORDER — SODIUM CHLORIDE 0.9% FLUSH
3.0000 mL | Freq: Two times a day (BID) | INTRAVENOUS | Status: DC
  Administered 2016-07-07 – 2016-07-12 (×9): 3 mL via INTRAVENOUS

## 2016-07-06 MED ORDER — MAGNESIUM SULFATE 4 GM/100ML IV SOLN
4.0000 g | Freq: Once | INTRAVENOUS | Status: AC
  Administered 2016-07-06: 4 g via INTRAVENOUS
  Filled 2016-07-06: qty 100

## 2016-07-06 MED ORDER — HEMOSTATIC AGENTS (NO CHARGE) OPTIME
TOPICAL | Status: DC | PRN
  Administered 2016-07-06: 1 via TOPICAL

## 2016-07-06 MED ORDER — SODIUM CHLORIDE 0.45 % IV SOLN: INTRAVENOUS | Status: DC | PRN

## 2016-07-06 MED ORDER — VECURONIUM BROMIDE 10 MG IV SOLR
INTRAVENOUS | Status: AC
  Filled 2016-07-06: qty 10

## 2016-07-06 MED ORDER — ROCURONIUM BROMIDE 50 MG/5ML IV SOLN
INTRAVENOUS | Status: AC
  Filled 2016-07-06: qty 3

## 2016-07-06 MED ORDER — BISACODYL 10 MG RE SUPP: 10.0000 mg | Freq: Every day | RECTAL | Status: DC

## 2016-07-06 MED ORDER — ACETAMINOPHEN 160 MG/5ML PO SOLN: 650.0000 mg | Freq: Once | ORAL | Status: AC

## 2016-07-06 MED ORDER — HEMOSTATIC AGENTS (NO CHARGE) OPTIME
TOPICAL | Status: DC | PRN
  Administered 2016-07-06 (×2): 1 via TOPICAL

## 2016-07-06 MED ORDER — NITROGLYCERIN IN D5W 200-5 MCG/ML-% IV SOLN: 0.0000 ug/min | INTRAVENOUS | Status: DC

## 2016-07-06 MED ORDER — SODIUM CHLORIDE 0.9 % IV SOLN: INTRAVENOUS | Status: DC

## 2016-07-06 MED ORDER — ALBUMIN HUMAN 5 % IV SOLN
INTRAVENOUS | Status: DC | PRN
  Administered 2016-07-06 (×3): via INTRAVENOUS

## 2016-07-06 MED ORDER — CHLORHEXIDINE GLUCONATE 0.12% ORAL RINSE (MEDLINE KIT)
15.0000 mL | Freq: Two times a day (BID) | OROMUCOSAL | Status: DC
  Administered 2016-07-06 – 2016-07-07 (×2): 15 mL via OROMUCOSAL

## 2016-07-06 MED ORDER — PANTOPRAZOLE SODIUM 40 MG PO TBEC
40.0000 mg | DELAYED_RELEASE_TABLET | Freq: Every day | ORAL | Status: DC
  Administered 2016-07-08 – 2016-07-13 (×6): 40 mg via ORAL
  Filled 2016-07-06 (×7): qty 1

## 2016-07-06 MED ORDER — POTASSIUM CHLORIDE 10 MEQ/50ML IV SOLN: 10.0000 meq | INTRAVENOUS | Status: AC

## 2016-07-06 MED ORDER — SODIUM CHLORIDE 0.9 % IJ SOLN
INTRAMUSCULAR | Status: AC
  Filled 2016-07-06: qty 20

## 2016-07-06 MED ORDER — DEXTROSE 5 % IV SOLN
1.5000 g | Freq: Two times a day (BID) | INTRAVENOUS | Status: AC
  Administered 2016-07-07 – 2016-07-08 (×4): 1.5 g via INTRAVENOUS
  Filled 2016-07-06 (×4): qty 1.5

## 2016-07-06 MED ORDER — PROTAMINE SULFATE 10 MG/ML IV SOLN
INTRAVENOUS | Status: AC
  Filled 2016-07-06: qty 25

## 2016-07-06 MED FILL — Cefuroxime Sodium For Inj 750 MG: INTRAMUSCULAR | Qty: 750 | Status: AC

## 2016-07-06 MED FILL — Magnesium Sulfate Inj 50%: INTRAMUSCULAR | Qty: 10 | Status: AC

## 2016-07-06 MED FILL — Potassium Chloride Inj 2 mEq/ML: INTRAVENOUS | Qty: 40 | Status: AC

## 2016-07-06 MED FILL — Heparin Sodium (Porcine) Inj 1000 Unit/ML: INTRAMUSCULAR | Qty: 30 | Status: AC

## 2016-07-06 SURGICAL SUPPLY — 134 items
ADAPTER CARDIO PERF ANTE/RETRO (ADAPTER) ×5 IMPLANT
ADPR PRFSN 84XANTGRD RTRGD (ADAPTER) ×3
APL SKNCLS STERI-STRIP NONHPOA (GAUZE/BANDAGES/DRESSINGS) ×3
BAG DECANTER FOR FLEXI CONT (MISCELLANEOUS) ×5 IMPLANT
BANDAGE ACE 4X5 VEL STRL LF (GAUZE/BANDAGES/DRESSINGS) ×2 IMPLANT
BANDAGE ACE 6X5 VEL STRL LF (GAUZE/BANDAGES/DRESSINGS) ×2 IMPLANT
BANDAGE ELASTIC 4 VELCRO ST LF (GAUZE/BANDAGES/DRESSINGS) ×5 IMPLANT
BANDAGE ELASTIC 6 VELCRO ST LF (GAUZE/BANDAGES/DRESSINGS) ×5 IMPLANT
BASKET HEART  (ORDER IN 25'S) (MISCELLANEOUS) ×1
BASKET HEART (ORDER IN 25'S) (MISCELLANEOUS) ×1
BASKET HEART (ORDER IN 25S) (MISCELLANEOUS) ×3 IMPLANT
BENZOIN TINCTURE PRP APPL 2/3 (GAUZE/BANDAGES/DRESSINGS) ×5 IMPLANT
BLADE MINI RND TIP GREEN BEAV (BLADE) ×2 IMPLANT
BLADE STERNUM SYSTEM 6 (BLADE) ×5 IMPLANT
BLADE SURG 12 STRL SS (BLADE) ×5 IMPLANT
BLADE SURG ROTATE 9660 (MISCELLANEOUS) IMPLANT
BNDG GAUZE ELAST 4 BULKY (GAUZE/BANDAGES/DRESSINGS) ×5 IMPLANT
CANISTER SUCTION 2500CC (MISCELLANEOUS) ×10 IMPLANT
CANNULA GUNDRY RCSP 15FR (MISCELLANEOUS) ×5 IMPLANT
CANNULA VESSEL 3MM 2 BLNT TIP (CANNULA) IMPLANT
CATH CPB KIT VANTRIGT (MISCELLANEOUS) ×5 IMPLANT
CATH ROBINSON RED A/P 18FR (CATHETERS) ×20 IMPLANT
CATH THORACIC 36FR RT ANG (CATHETERS) ×5 IMPLANT
CLIP FOGARTY SPRING 6M (CLIP) ×2 IMPLANT
CLIP LIGATING EXTRA MED SLVR (CLIP) ×5 IMPLANT
CLIP LIGATING EXTRA SM BLUE (MISCELLANEOUS) ×5 IMPLANT
CLIP RETRACTION 3.0MM CORONARY (MISCELLANEOUS) ×2 IMPLANT
CLIP TI WIDE RED SMALL 24 (CLIP) ×2 IMPLANT
CLOSURE WOUND 1/2 X4 (GAUZE/BANDAGES/DRESSINGS) ×1
CRADLE DONUT ADULT HEAD (MISCELLANEOUS) ×10 IMPLANT
DECANTER SPIKE VIAL GLASS SM (MISCELLANEOUS) IMPLANT
DRAIN CHANNEL 32F RND 10.7 FF (WOUND CARE) ×5 IMPLANT
DRAIN HEMOVAC 1/8 X 5 (WOUND CARE) IMPLANT
DRAPE CARDIOVASCULAR INCISE (DRAPES) ×5
DRAPE SLUSH/WARMER DISC (DRAPES) ×5 IMPLANT
DRAPE SRG 135X102X78XABS (DRAPES) ×3 IMPLANT
DRSG AQUACEL AG ADV 3.5X14 (GAUZE/BANDAGES/DRESSINGS) ×5 IMPLANT
DRSG COVADERM 4X6 (GAUZE/BANDAGES/DRESSINGS) ×2 IMPLANT
DRSG COVADERM 4X8 (GAUZE/BANDAGES/DRESSINGS) IMPLANT
ELECT BLADE 4.0 EZ CLEAN MEGAD (MISCELLANEOUS) ×5
ELECT BLADE 6.5 EXT (BLADE) ×5 IMPLANT
ELECT CAUTERY BLADE 6.4 (BLADE) ×5 IMPLANT
ELECT REM PT RETURN 9FT ADLT (ELECTROSURGICAL) ×15
ELECTRODE BLDE 4.0 EZ CLN MEGD (MISCELLANEOUS) ×3 IMPLANT
ELECTRODE REM PT RTRN 9FT ADLT (ELECTROSURGICAL) ×9 IMPLANT
EVACUATOR SILICONE 100CC (DRAIN) IMPLANT
FELT TEFLON 1X6 (MISCELLANEOUS) ×5 IMPLANT
GAUZE SPONGE 4X4 12PLY STRL (GAUZE/BANDAGES/DRESSINGS) ×15 IMPLANT
GEL ULTRASOUND 20GR AQUASONIC (MISCELLANEOUS) IMPLANT
GLOVE BIO SURGEON STRL SZ 6.5 (GLOVE) ×6 IMPLANT
GLOVE BIO SURGEON STRL SZ7.5 (GLOVE) ×23 IMPLANT
GLOVE BIO SURGEONS STRL SZ 6.5 (GLOVE) ×6
GLOVE BIOGEL PI IND STRL 6 (GLOVE) IMPLANT
GLOVE BIOGEL PI INDICATOR 6 (GLOVE) ×8
GLOVE SS BIOGEL STRL SZ 7.5 (GLOVE) ×3 IMPLANT
GLOVE SUPERSENSE BIOGEL SZ 7.5 (GLOVE) ×2
GOWN STRL REUS W/ TWL LRG LVL3 (GOWN DISPOSABLE) ×21 IMPLANT
GOWN STRL REUS W/TWL LRG LVL3 (GOWN DISPOSABLE) ×60
HEMOSTAT POWDER SURGIFOAM 1G (HEMOSTASIS) ×15 IMPLANT
HEMOSTAT SURGICEL 2X14 (HEMOSTASIS) ×5 IMPLANT
INSERT FOGARTY XLG (MISCELLANEOUS) ×2 IMPLANT
KIT BASIN OR (CUSTOM PROCEDURE TRAY) ×10 IMPLANT
KIT ROOM TURNOVER OR (KITS) ×10 IMPLANT
KIT SHUNT ARGYLE CAROTID ART 6 (VASCULAR PRODUCTS) ×2 IMPLANT
KIT SUCTION CATH 14FR (SUCTIONS) ×5 IMPLANT
KIT VASOVIEW 6 PRO VH 2400 (KITS) ×5 IMPLANT
LEAD PACING MYOCARDI (MISCELLANEOUS) ×5 IMPLANT
MARKER GRAFT CORONARY BYPASS (MISCELLANEOUS) ×15 IMPLANT
NEEDLE 22X1 1/2 (OR ONLY) (NEEDLE) IMPLANT
NS IRRIG 1000ML POUR BTL (IV SOLUTION) ×37 IMPLANT
PACK CAROTID (CUSTOM PROCEDURE TRAY) ×3 IMPLANT
PACK OPEN HEART (CUSTOM PROCEDURE TRAY) ×5 IMPLANT
PAD ARMBOARD 7.5X6 YLW CONV (MISCELLANEOUS) ×20 IMPLANT
PAD ELECT DEFIB RADIOL ZOLL (MISCELLANEOUS) ×5 IMPLANT
PATCH HEMASHIELD 8X150 (Vascular Products) ×2 IMPLANT
PENCIL BUTTON HOLSTER BLD 10FT (ELECTRODE) ×5 IMPLANT
PUNCH AORTIC ROTATE  4.5MM 8IN (MISCELLANEOUS) ×2 IMPLANT
PUNCH AORTIC ROTATE 4.0MM (MISCELLANEOUS) IMPLANT
PUNCH AORTIC ROTATE 4.5MM 8IN (MISCELLANEOUS) IMPLANT
PUNCH AORTIC ROTATE 5MM 8IN (MISCELLANEOUS) IMPLANT
SET CARDIOPLEGIA MPS 5001102 (MISCELLANEOUS) ×2 IMPLANT
SHUNT CAROTID BYPASS 10 (VASCULAR PRODUCTS) IMPLANT
SHUNT CAROTID BYPASS 12FRX15.5 (VASCULAR PRODUCTS) IMPLANT
SPONGE GAUZE 4X4 12PLY STER LF (GAUZE/BANDAGES/DRESSINGS) ×4 IMPLANT
SPONGE INTESTINAL PEANUT (DISPOSABLE) ×5 IMPLANT
SPONGE LAP 18X18 X RAY DECT (DISPOSABLE) ×4 IMPLANT
STRIP CLOSURE SKIN 1/2X4 (GAUZE/BANDAGES/DRESSINGS) ×4 IMPLANT
SURGIFLO W/THROMBIN 8M KIT (HEMOSTASIS) ×5 IMPLANT
SUT BONE WAX W31G (SUTURE) ×5 IMPLANT
SUT ETHILON 3 0 FSL (SUTURE) ×2 IMPLANT
SUT ETHILON 3 0 PS 1 (SUTURE) IMPLANT
SUT MNCRL AB 4-0 PS2 18 (SUTURE) ×2 IMPLANT
SUT PROLENE 3 0 SH DA (SUTURE) ×2 IMPLANT
SUT PROLENE 3 0 SH1 36 (SUTURE) IMPLANT
SUT PROLENE 4 0 RB 1 (SUTURE) ×15
SUT PROLENE 4 0 SH DA (SUTURE) ×5 IMPLANT
SUT PROLENE 4-0 RB1 .5 CRCL 36 (SUTURE) ×3 IMPLANT
SUT PROLENE 5 0 C 1 36 (SUTURE) IMPLANT
SUT PROLENE 6 0 C 1 24 (SUTURE) ×2 IMPLANT
SUT PROLENE 6 0 C 1 30 (SUTURE) IMPLANT
SUT PROLENE 6 0 CC (SUTURE) ×26 IMPLANT
SUT PROLENE 8 0 BV175 6 (SUTURE) IMPLANT
SUT PROLENE BLUE 7 0 (SUTURE) ×9 IMPLANT
SUT PROLENE POLY MONO (SUTURE) ×6 IMPLANT
SUT SILK  1 MH (SUTURE)
SUT SILK 1 MH (SUTURE) IMPLANT
SUT SILK 2 0 SH CR/8 (SUTURE) ×2 IMPLANT
SUT SILK 3 0 (SUTURE)
SUT SILK 3 0 REEL (SUTURE) ×4 IMPLANT
SUT SILK 3 0 SH CR/8 (SUTURE) IMPLANT
SUT SILK 3-0 18XBRD TIE 12 (SUTURE) IMPLANT
SUT STEEL 6MS V (SUTURE) ×12 IMPLANT
SUT STEEL SZ 6 DBL 3X14 BALL (SUTURE) ×7 IMPLANT
SUT VIC AB 1 CTX 18 (SUTURE) ×2 IMPLANT
SUT VIC AB 1 CTX 36 (SUTURE) ×10
SUT VIC AB 1 CTX36XBRD ANBCTR (SUTURE) ×6 IMPLANT
SUT VIC AB 2-0 CT1 27 (SUTURE) ×5
SUT VIC AB 2-0 CT1 TAPERPNT 27 (SUTURE) IMPLANT
SUT VIC AB 2-0 CTX 27 (SUTURE) IMPLANT
SUT VIC AB 3-0 SH 27 (SUTURE) ×15
SUT VIC AB 3-0 SH 27X BRD (SUTURE) ×6 IMPLANT
SUT VIC AB 3-0 X1 27 (SUTURE) ×2 IMPLANT
SUT VICRYL 4-0 PS2 18IN ABS (SUTURE) ×5 IMPLANT
SUTURE E-PAK OPEN HEART (SUTURE) ×5 IMPLANT
SYR CONTROL 10ML LL (SYRINGE) IMPLANT
SYSTEM SAHARA CHEST DRAIN ATS (WOUND CARE) ×5 IMPLANT
TAPE CLOTH SURG 4X10 WHT LF (GAUZE/BANDAGES/DRESSINGS) ×2 IMPLANT
TAPE PAPER 2X10 WHT MICROPORE (GAUZE/BANDAGES/DRESSINGS) ×2 IMPLANT
TOWEL OR 17X24 6PK STRL BLUE (TOWEL DISPOSABLE) ×10 IMPLANT
TOWEL OR 17X26 10 PK STRL BLUE (TOWEL DISPOSABLE) ×10 IMPLANT
TRAY FOLEY IC TEMP SENS 16FR (CATHETERS) ×5 IMPLANT
TUBING INSUFFLATION (TUBING) ×5 IMPLANT
UNDERPAD 30X30 INCONTINENT (UNDERPADS AND DIAPERS) ×5 IMPLANT
WATER STERILE IRR 1000ML POUR (IV SOLUTION) ×15 IMPLANT

## 2016-07-06 NOTE — Progress Notes (Signed)
Patient performed a NIF of -22 and a Vital Capacity of 671ml, with good effort. PO2 on ABG was low. RN calling MD.

## 2016-07-06 NOTE — H&P (View-Only) (Signed)
Subjective: Interval History: none.. Anxious to proceed with surgery tomorrow   Objective: Vital signs in last 24 hours: Temp:  [98 F (36.7 C)-98.6 F (37 C)] 98 F (36.7 C) (07/13 0002) Pulse Rate:  [47-56] 53 (07/13 0755) Resp:  [16-18] 16 (07/13 0755) BP: (115-141)/(66-81) 141/71 mmHg (07/13 0755) SpO2:  [93 %-98 %] 98 % (07/13 0755) Weight:  [208 lb 15.9 oz (94.8 kg)] 208 lb 15.9 oz (94.8 kg) (07/13 0600)  Intake/Output from previous day: 07/12 0701 - 07/13 0700 In: 1308 [P.O.:840; I.V.:468] Out: 2750 [Urine:2750] Intake/Output this shift: Total I/O In: 19.5 [I.V.:19.5] Out: -   No change. Neurologically intact  Lab Results:  Recent Labs  07/04/16 0253 07/05/16 0354  WBC 12.5* 12.7*  HGB 13.7 13.0  HCT 42.2 39.7  PLT 427* 385   BMET  Recent Labs  07/04/16 0253 07/05/16 0354  NA 137 137  K 4.2 4.1  CL 98* 99*  CO2 30 29  GLUCOSE 106* 108*  BUN 8 8  CREATININE 0.86 0.84  CALCIUM 10.4* 10.3    Studies/Results: Dg Chest 2 View  07/05/2016  CLINICAL DATA:  Preoperative respiratory evaluation for CABG tomorrow. EXAM: CHEST  2 VIEW COMPARISON:  06/27/2016. FINDINGS: The lungs are clear wiithout focal pneumonia, edema, pneumothorax or pleural effusion. There is basilar atelectasis bilaterally. The cardio pericardial silhouette is enlarged. The visualized bony structures of the thorax are intact. Telemetry leads overlie the chest. IMPRESSION: Bibasilar atelectasis.  No acute findings. Electronically Signed   By: Misty Stanley M.D.   On: 07/05/2016 07:53   Dg Chest 2 View  06/27/2016  CLINICAL DATA:  Severe back/chest pain EXAM: CHEST  2 VIEW COMPARISON:  11/30/2015 FINDINGS: Lungs are clear.  No pleural effusion or pneumothorax. The heart is normal in size. Visualized osseous structures are within normal limits. IMPRESSION: Normal chest radiographs. Electronically Signed   By: Julian Hy M.D.   On: 06/27/2016 12:27   Ct Angio Neck W Or Wo  Contrast  06/30/2016  CLINICAL DATA:  45 year old female with poorly controlled diabetes and hypertension. Three vessel coronary disease with planned for CABG carotid ultrasound suggesting critical stenosis in the right ICA. Initial encounter. EXAM: CT ANGIOGRAPHY NECK TECHNIQUE: Multidetector CT imaging of the neck was performed using the standard protocol during bolus administration of intravenous contrast. Multiplanar CT image reconstructions and MIPs were obtained to evaluate the vascular anatomy. Carotid stenosis measurements (when applicable) are obtained utilizing NASCET criteria, using the distal internal carotid diameter as the denominator. CONTRAST:  50 mL Isovue 370 COMPARISON:  [facility] Head CT without contrast 01/11/2014, and earlier FINDINGS: Skeleton: Intermittent poor dentition. Straightening of cervical lordosis. No acute osseous abnormality identified. Visualized paranasal sinuses and mastoids are well pneumatized. Other neck: Negative lung apices. No superior mediastinal lymphadenopathy. Negative thyroid, larynx, pharynx, parapharyngeal spaces, retropharyngeal space, sublingual space, submandibular glands, and parotid glands. No cervical lymphadenopathy. Aortic arch: 4 vessel arch configuration, the left vertebral artery arises directly off the arch but is heavily diseased as detailed below. Calcified arch atherosclerosis. Right carotid system: Soft plaque in the right brachiocephalic artery and right CCA origin without hemodynamically significant stenosis. Somewhat bulky soft plaque along the ventral right CCA (series 401, image 41) without hemodynamically significant stenosis. Bulky mostly soft plaque occupying the right ICA origin and bulb resulting in high-grade radiographic string sign stenosis over a segment of about 8 mm as seen on series 401, image 60, series 404, image 72, and series 406, image 13). Despite  this the right ICA remains patent, and appears otherwise  negative to the skullbase. The visible right ICA siphon is patent and negative. Left carotid system: Soft plaque at the left CCA origin with less than 50% stenosis with respect to the distal vessel. Circumferential soft plaque in the left CCA proximal to the bifurcation without stenosis. Soft and calcified plaque at the left carotid bifurcation and involving the left ICA origin and bulb, but no subsequent stenosis. Negative cervical left ICA otherwise. Visible left ICA siphon is patent but with moderate calcified plaque (series 401, image 102). Vertebral arteries: Soft plaque at the right subclavian artery origin resulting in up to 50% stenosis. Right vertebral artery origin is mildly stenotic due to soft plaque. The right vertebral artery is dominant. There is a mild stenosis in the distal V1 segment as seen on series 403, image 134. Otherwise the right vertebral artery is normal to the skullbase. Intracranially however there is severe stenosis of the right V4 segment at the PICA origin (series 401, image 85). Despite this the distal right vertebral remains patent to the junction with the basilar. The visible basilar artery is negative. The left vertebral artery arises directly from the arch, but is severely diseased and appears occluded in the V1 segment. Flow is reconstituted in the left V2 segment beginning at the C5 level, but the left vertebral artery is diminutive until the C2 level. There is then mild irregularity and stenosis in the left V3 segment, and moderate to severe stenosis in the proximal left V4 segment (series 402, image 164. There is calcified plaque near the left PICA origin which remains patent. There is mild to moderate irregularity and mild stenosis at the left vertebrobasilar junction. IMPRESSION: 1. Very age advanced atherosclerosis. Bulky soft plaque resulting in RADIOGRAPHIC STRING SIGN stenosis of the right ICA bulb over a segment of about 8 mm. 2. Widespread left carotid atherosclerosis  but no hemodynamically significant stenosis. Left ICA siphon calcified plaque partially visible. 3. Significant posterior circulation atherosclerosis and stenosis: The left vertebral artery arises directly from the arch, but its origin and the left V1 segment are occluded. Flow is reconstituted in the left V2 segment. Mild right vertebral artery origin stenosis, but extensive bilateral V4 vertebral atherosclerosis with moderate severe stenosis. Despite this the vertebrobasilar junction remains patent. Electronically Signed   By: Genevie Ann M.D.   On: 06/30/2016 15:27   Ct Angio Chest/abd/pel For Dissection W And/or W/wo  06/28/2016  EXAM: CT ANGIOGRAPHY CHEST, ABDOMEN, PELVIS WITH CONTRAST TECHNIQUE: Multidetector CT imaging of the chest, abdomen, pelvis was performed using the standard protocol during bolus administration of intravenous contrast. Multiplanar CT image reconstructions and MIPs were obtained to evaluate the vascular anatomy. CONTRAST:  100 mL Isovue 370 IV COMPARISON:  05/28/2009 FINDINGS: CHEST Vascular: Left arm IV contrast administration. Innominate vein and SVC are patent. Right atrium and right ventricle are nondilated. Satisfactory opacification of pulmonary arteries noted, and there is no evidence of pulmonary emboli. Patent bilateral pulmonary veins drain into the left atrium. Scattered coronary calcifications most evident on the noncontrast scan. Adequate contrast opacification of the thoracic aorta with no evidence of dissection, aneurysm, or stenosis. There is patent brachiocephalic arch anatomy without proximal stenosis. Partially calcified atheromatous plaque in the aortic arch and descending segment. Mediastinum/Lymph Nodes: No masses or pathologically enlarged lymph nodes identified. No pericardial effusion. Lungs/Pleura: 66mm noncalcified nodule, superior segment right lower lobe image 20/3. Subsegmental dependent atelectasis or scarring posteriorly in both lower lobes. No pleural  effusion. No pneumothorax. Musculoskeletal: No chest wall mass or suspicious bone lesions identified. ABDOMEN Arterial Aorta: Moderate atheromatous plaque particularly in the infrarenal segment, without aneurysm, dissection, or stenosis. Celiac axis:          Patent Superior mesenteric:  Patent Left renal: Minimal ostial plaque, nonocclusive. Patent distally. Right renal:          Patent Inferior mesenteric:  Patent Left iliac: Partially calcified plaque through the common and internal iliac arteries without high-grade stenosis, aneurysm or dissection. External iliac patent. Common femoral patent. Right iliac: Mild common iliac and proximal internal iliac plaque. No aneurysm, dissection, or high-grade stenosis. Venous Dedicated venous phase imaging not obtained. Patent bilateral renal veins, portal vein, splenic vein. Review of the MIP images confirms the above findings. Nonvasular Hepatobiliary: No masses or other significant abnormality. Pancreas: No mass, inflammatory changes, or other significant abnormality. Spleen: Within normal limits in size and appearance. Adrenals/Urinary Tract: No masses identified. No evidence of hydronephrosis. Stomach/Bowel: No evidence of obstruction, inflammatory process, or abnormal fluid collections. Normal appendix. Scattered diverticula, most numerous in the distal descending segment, without significant adjacent inflammatory/edematous change. Lymphatic: No pathologically enlarged lymph nodes. Reproductive: No mass or other significant abnormality. Other: No ascites.  No free air. Musculoskeletal: Obesity. Stable discogenic sclerosis adjacent to the inferior plate of L4. Negative for fracture or other acute bone lesion. Subcutaneous foci of gas and fluid in the anterior right lower quadrant and left lower quadrant body wall presumably related to recent injections. Tiny umbilical hernia containing only mesenteric fat. IMPRESSION: 1. Negative for aortic dissection or pulmonary  emboli. 2. 6 mm nonspecific nodule, superior segment right lower lobe. Non-contrast chest CT at 6-12 months is recommended. If the nodule is stable at time of repeat CT, then future CT at 18-24 months (from today's scan) is considered optional for low-risk patients, but is recommended for high-risk patients. This recommendation follows the consensus statement: Guidelines for Management of Incidental Pulmonary Nodules Detected on CT Images:From the Fleischner Society 2017; published online before print (10.1148/radiol.IJ:2314499). 3. Atherosclerosis, including Aortic Atherosclerosis (ICD10-170.0), iliac and coronary artery disease. Please note that although the presence of coronary artery calcium documents the presence of coronary artery disease, the severity of this disease and any potential stenosis cannot be assessed on this non-gated CT examination. Assessment for potential risk factor modification, dietary therapy or pharmacologic therapy may be warranted, if clinically indicated. 4. Colonic diverticulosis. Electronically Signed   By: Lucrezia Europe M.D.   On: 06/28/2016 12:40   Anti-infectives: Anti-infectives    None      Assessment/Plan: s/p Procedure(s): CORONARY ARTERY BYPASS GRAFTING (CABG) ([name]) TRANSESOPHAGEAL ECHOCARDIOGRAM (TEE) ([name]) ENDARTERECTOMY CAROTID (Right) Again discussed in length plan for combined right carotid endarterectomy by myself and coronary artery bypass grafting with Dr. Nils Pyle tomorrow.'s custody the 1-2% risk of stroke with carotid surgery plus whatever wrist there would be with coronary bypass grafting. Understands and wishes to proceed as planned.   LOS: 6 days   Erika [name] 07/05/2016, 8:27 AM

## 2016-07-06 NOTE — Interval H&P Note (Signed)
History and Physical Interval Note:  07/06/2016 7:22 AM  Erika [name]  has presented today for surgery, with the diagnosis of CAD  The various methods of treatment have been discussed with the patient and family. After consideration of risks, benefits and other options for treatment, the patient has consented to  Procedure(s): CORONARY ARTERY BYPASS GRAFTING (CABG) ([name]) TRANSESOPHAGEAL ECHOCARDIOGRAM (TEE) ([name]) ENDARTERECTOMY CAROTID (Right) as a surgical intervention .  The patient's history has been reviewed, patient examined, no change in status, stable for surgery.  I have reviewed the patient's chart and labs.  Questions were answered to the patient's satisfaction.     Curt Jews

## 2016-07-06 NOTE — Op Note (Signed)
    OPERATIVE REPORT  DATE OF SURGERY: 07/06/2016  PATIENT: Erika [name], 45 y.o. female MRN: [id number]  DOB: 08/12/1971  PRE-OPERATIVE DIAGNOSIS: Severe asymptomatic carotid disease right carotid  POST-OPERATIVE DIAGNOSIS:  Same  PROCEDURE: Right carotid endarterectomy and Dacron patch angioplasty  SURGEON:  Curt Jews, M.D.  PHYSICIAN ASSISTANT: Samantha Rhyne PA-C  ANESTHESIA:  Gen.  EBL: Less than 100 ml  Total I/O In: 3250 [I.V.:3000; IV Piggyback:250] Out: 975 [Urine:775; Blood:200]  BLOOD ADMINISTERED: None  DRAINS: None  SPECIMEN: None  COUNTS CORRECT:  YES  PLAN OF CARE: Coronary artery bypass grafting by Dr. Prescott Gum which be dictated as a separate note   PATIENT DISPOSITION:  PACU - hemodynamically stable  PROCEDURE DETAILS: A she was taken to the operative placed supine position where the right neck and chest and both legs prepped and draped in sterile fashion. Incision was made anterior cervical mastoid and carried down to the platysma left cautery. The sternocleidomastoid reflected posteriorly and the carotid sheath was opened. Patient had extensive plaque on the common carotid artery that extended further proximally and incision was extended further proximally as well. The common carotid artery was encircled with umbilical tape and Rummel tourniquet. Dissection continued onto the bifurcation in the superior third artery with cervical with a 2-0 silk Potts tie and the external carotid was encircled with a blue vessel loop. The internal carotid was encircled with an umbilical tape and Rummel tourniquet. The vagus and hypoglossal nerves were identified and preserved. The patient was given 9000 units of intravenous heparin and after adequate circulation time the common internal and external carotid arteries were occluded. The common carotid artery was opened with 11 blade and sent longitudinally with Potts scissors onto the internal carotid. A long 10 shunt  was passed up the internal carotid and allowed to back bleed and down the common carotid worse occurred with Rummel tourniquet. The endarterectomy was begun on the common carotid artery and the plaque was divided proximally with Potts scissors. There was a long arteriotomy in the common carotid artery due to extensive plaque in the common carotid artery. Dissection was extended onto the bifurcation in the external carotid was endarterectomized with a eversion technique and the internal carotid was endarterectomized in an open fashion. Remaining atheromatous debris was removed from the endarterectomy plane. A long Finesse Hemashield Dacron patch was brought onto the field was sewn as a patch angioplasty with a running 6-0 Prolene suture. Prior to completion of the closure the shunt was removed and the usual flushing maneuvers were undertaken. Anastomosis completed and flow was restored first to the external and the internal carotid artery. Excellent flow characteristics were noted with hand-held Doppler on the internal and external carotid arteries. Wounds irrigated with saline. The patient's heparin was not reversed due to the need for coronary artery bypass grafting. A& surgical sponge were paced placed in the base of the wound and the wound was closed over this with the 3-0 nylon sutures. At the completion coronary bypass grafting this was removed in the usual closure was done.   Curt Jews, M.D. 07/06/2016 3:25 PM

## 2016-07-06 NOTE — Brief Op Note (Signed)
06/29/2016 - 07/06/2016  1:00 PM  PATIENT:  Erika [name]  45 y.o. female  PRE-OPERATIVE DIAGNOSIS:  CAD, right carotid stenosis  POST-OPERATIVE DIAGNOSIS:  CAD, right carotid stenosis  PROCEDURE:  Procedure(s):  CORONARY ARTERY BYPASS GRAFTING x 4 -LIMA to LAD -SVG to Diagonal -SVG to OM -SVG to RCA  ENDOSCOPIC HARVEST GREATER SAPHENOUS VEIN -Right Leg  ENDARTERECTOMY CAROTID (Right)   TRANSESOPHAGEAL ECHOCARDIOGRAM (TEE) ([name])   SURGEON:  Surgeon(s) and Role: Panel 1:    * Ivin Poot, MD - Primary  Panel 2:    * Rosetta Posner, MD - Primary  ASSISTANTS: Sherian Maroon CST CSFA,     ANESTHESIA:   general  EBL:  Total I/O In: 2000 [I.V.:2000] Out: 750 [Urine:550; Blood:200]  BLOOD ADMINISTERED:CELLSAVER  DRAINS: Left pleural chest tubes, Mediastinal Chest drains   LOCAL MEDICATIONS USED:  NONE  SPECIMEN:  No Specimen  DISPOSITION OF SPECIMEN:  [name]  COUNTS:  YES  TOURNIQUET:  * No tourniquets in log *  DICTATION: .Dragon Dictation  PLAN OF CARE: Admit to inpatient   PATIENT DISPOSITION:  ICU - intubated and hemodynamically stable.   Delay start of Pharmacological VTE agent (>24hrs) due to surgical blood loss or risk of bleeding: yes

## 2016-07-06 NOTE — Anesthesia Postprocedure Evaluation (Signed)
Anesthesia Post Note  Patient: Erika [name]  Procedure(s) Performed: Procedure(s) (LRB): CORONARY ARTERY BYPASS GRAFTING (CABG) x four, using left internal mammary artery and right leg greater saphenous vein harvested endoscopically ([name]) TRANSESOPHAGEAL ECHOCARDIOGRAM (TEE) ([name]) ENDARTERECTOMY CAROTID (Right)  Patient location during evaluation: SICU Anesthesia Type: General Level of consciousness: patient remains intubated per anesthesia plan Pain management: pain level controlled Vital Signs Assessment: post-procedure vital signs reviewed and stable Respiratory status: patient on ventilator - see flowsheet for VS and patient remains intubated per anesthesia plan Cardiovascular status: blood pressure returned to baseline Anesthetic complications: no    Last Vitals:  Filed Vitals:   07/05/16 2300 07/06/16 1701  BP: 119/48 108/64  Pulse: 55 90  Temp: 36.7 C   Resp:  17    Last Pain:  Filed Vitals:   07/06/16 1726  PainSc: 0-No pain                 Airica Schwartzkopf COKER

## 2016-07-06 NOTE — Progress Notes (Signed)
The patient was examined and preop studies reviewed. There has been no change from the prior exam and the patient is ready for surgery.  plan CABG on C Edge today

## 2016-07-06 NOTE — Progress Notes (Signed)
Rapid Wean protocol begun, patient is tolerating well. RN at bedside.

## 2016-07-06 NOTE — Anesthesia Preprocedure Evaluation (Addendum)
Anesthesia Evaluation  Patient identified by MRN, date of birth, ID band Patient awake    Reviewed: Allergy & Precautions, NPO status , Patient's Chart, lab work & pertinent test results  Airway Mallampati: I  TM Distance: >3 FB Neck ROM: full    Dental  (+) Teeth Intact   Pulmonary Current Smoker,    Pulmonary exam normal breath sounds clear to auscultation       Cardiovascular hypertension, On Medications + CAD, + Past MI and + Peripheral Vascular Disease   Rhythm:Regular Rate:Normal     Neuro/Psych  Headaches,    GI/Hepatic   Endo/Other  diabetes, Poorly Controlled, Type 1, Insulin Dependent  Renal/GU      Musculoskeletal   Abdominal   Peds  Hematology   Anesthesia Other Findings Study Conclusions  - Left ventricle: The cavity size was normal. There was mild  concentric hypertrophy. Systolic function was normal. The  estimated ejection fraction was in the range of 50% to 55%.  Select image suggestive of mild inferior wall hypokinesis.  Doppler parameters are consistent with abnormal left ventricular  relaxation (grade 1 diastolic dysfunction). - Mitral valve: There was mild regurgitation. - Left atrium: The atrium was normal in size. - Right ventricle: Systolic function was normal. - Pulmonary arteries: Systolic pressure was within the normal  range.     Reproductive/Obstetrics                           Anesthesia Physical Anesthesia Plan  ASA: IV  Anesthesia Plan: General   Post-op Pain Management:    Induction: Intravenous  Airway Management Planned: Oral ETT  Additional Equipment: Arterial line, PA Cath, TEE, CVP and Ultrasound Guidance Line Placement  Intra-op Plan:   Post-operative Plan: Post-operative intubation/ventilation  Informed Consent: I have reviewed the patients History and Physical, chart, labs and discussed the procedure including the risks,  benefits and alternatives for the proposed anesthesia with the patient or authorized representative who has indicated his/her understanding and acceptance.   Dental Advisory Given  Plan Discussed with: Anesthesiologist, CRNA and Surgeon  Anesthesia Plan Comments:        Anesthesia Quick Evaluation

## 2016-07-06 NOTE — Progress Notes (Signed)
Rapid Wean Protocol - 2nd attempt begun. RT and RN at bedside.

## 2016-07-06 NOTE — Progress Notes (Addendum)
Dr. Roxy Manns notified about pO2 of 59 x 2 wean attempts and O2 sats on ABG 89%, all other parameters met. Orders received to let patient rest until the morning. RT notified and put patient back on full support. Precedex restarted at 0.2 mcg for patient comfort. Will continue to closely monitor.

## 2016-07-06 NOTE — Progress Notes (Signed)
TCTS BRIEF SICU PROGRESS NOTE  Day of Surgery  S/P Procedure(s) (LRB): CORONARY ARTERY BYPASS GRAFTING (CABG) x four, using left internal mammary artery and right leg greater saphenous vein harvested endoscopically ([name]) TRANSESOPHAGEAL ECHOCARDIOGRAM (TEE) ([name]) ENDARTERECTOMY CAROTID (Right)   Sedated on vent AAI paced w/ stable hemodynamics O2 sats 97% Chest tube output low UOP adequate Labs okay  Plan: Continue routine early postop  Rexene Alberts, MD 07/06/2016 7:39 PM

## 2016-07-06 NOTE — Anesthesia Procedure Notes (Addendum)
Central Venous Catheter Insertion Performed by: anesthesiologist Patient location: Pre-op. Preanesthetic checklist: patient identified, IV checked, site marked, risks and benefits discussed, surgical consent, monitors and equipment checked, pre-op evaluation, timeout performed and anesthesia consent Position: Trendelenburg Lidocaine 1% used for infiltration Landmarks identified and Seldinger technique used Catheter size: 8.5 Fr Central line and PA cath was placed.Sheath introducer Swan type and PA catheter depth:thermodilation and 50PA Cath depth:50 Procedure performed using ultrasound guided technique. Attempts: 1 Following insertion, line sutured and dressing applied. Post procedure assessment: blood return through all ports, free fluid flow and no air. Patient tolerated the procedure well with no immediate complications. Additional procedure comments: Attempt to float PAC x 5. Pressure reading from transducer was erroneous for three attempts. PAC removed completely from the introducer three times trying to trouble shoot erroneous pressure readings. On fourth attempt to float, pressure tracing appeared to be from the [facility] going down the IVC or R brachiocephalic. PAC pulled back again, but unable to remove through introducer due to resistance. Balloon re-inflated and deflated and attempted to remove again, but resistance met again. Attempted to float PAC again and successful. Discussed with Dr. Jillyn Hidden..    Procedure Name: Intubation Date/Time: 07/06/2016 7:44 AM Performed by: Neldon Newport Pre-anesthesia Checklist: Timeout performed, Patient being monitored, Suction available, Emergency Drugs available and Patient identified Patient Re-evaluated:Patient Re-evaluated prior to inductionOxygen Delivery Method: Circle system utilized Preoxygenation: Pre-oxygenation with 100% oxygen Intubation Type: IV induction Ventilation: Mask ventilation without difficulty Laryngoscope Size: Mac and  3 Grade View: Grade II Tube type: Oral Tube size: 8.0 mm Number of attempts: 1 Placement Confirmation: breath sounds checked- equal and bilateral,  positive ETCO2 and ETT inserted through vocal cords under direct vision Secured at: 22 cm Tube secured with: Tape Dental Injury: Teeth and Oropharynx as per pre-operative assessment

## 2016-07-06 NOTE — Progress Notes (Signed)
Patient placed back on rest due to PO2 low in ABG results and FVC of .5 and NIF of -18

## 2016-07-06 NOTE — Transfer of Care (Signed)
Immediate Anesthesia Transfer of Care Note  Patient: Erika [name]  Procedure(s) Performed: Procedure(s): CORONARY ARTERY BYPASS GRAFTING (CABG) x four, using left internal mammary artery and right leg greater saphenous vein harvested endoscopically ([name]) TRANSESOPHAGEAL ECHOCARDIOGRAM (TEE) ([name]) ENDARTERECTOMY CAROTID (Right)  Patient Location: SICU  Anesthesia Type:General  Level of Consciousness: Patient remains intubated per anesthesia plan  Airway & Oxygen Therapy: Patient remains intubated per anesthesia plan and Patient placed on Ventilator (see vital sign flow sheet for setting)  Post-op Assessment: Report given to RN  Post vital signs: Reviewed and stable  Last Vitals:  Filed Vitals:   07/05/16 2123 07/05/16 2300  BP: 129/68 119/48  Pulse:  55  Temp:  36.7 C  Resp:      Last Pain:  Filed Vitals:   07/06/16 0500  PainSc: 0-No pain      Patients Stated Pain Goal: 2 ([id number])  Complications: No apparent anesthesia complications

## 2016-07-07 ENCOUNTER — Inpatient Hospital Stay (HOSPITAL_COMMUNITY): Payer: BLUE CROSS/BLUE SHIELD

## 2016-07-07 LAB — GLUCOSE, CAPILLARY
GLUCOSE-CAPILLARY: 112 mg/dL — AB (ref 65–99)
GLUCOSE-CAPILLARY: 96 mg/dL (ref 65–99)
GLUCOSE-CAPILLARY: 160 mg/dL — AB (ref 65–99)
GLUCOSE-CAPILLARY: 112 mg/dL — AB (ref 65–99)
GLUCOSE-CAPILLARY: 111 mg/dL — AB (ref 65–99)
GLUCOSE-CAPILLARY: 114 mg/dL — AB (ref 65–99)
GLUCOSE-CAPILLARY: 102 mg/dL — AB (ref 65–99)
GLUCOSE-CAPILLARY: 114 mg/dL — AB (ref 65–99)
GLUCOSE-CAPILLARY: 161 mg/dL — AB (ref 65–99)
GLUCOSE-CAPILLARY: 146 mg/dL — AB (ref 65–99)
GLUCOSE-CAPILLARY: 126 mg/dL — AB (ref 65–99)
Glucose-Capillary: 103 mg/dL — ABNORMAL HIGH (ref 65–99)
Glucose-Capillary: 107 mg/dL — ABNORMAL HIGH (ref 65–99)
Glucose-Capillary: 110 mg/dL — ABNORMAL HIGH (ref 65–99)
Glucose-Capillary: 111 mg/dL — ABNORMAL HIGH (ref 65–99)
Glucose-Capillary: 119 mg/dL — ABNORMAL HIGH (ref 65–99)
Glucose-Capillary: 129 mg/dL — ABNORMAL HIGH (ref 65–99)
Glucose-Capillary: 130 mg/dL — ABNORMAL HIGH (ref 65–99)
Glucose-Capillary: 130 mg/dL — ABNORMAL HIGH (ref 65–99)
Glucose-Capillary: 142 mg/dL — ABNORMAL HIGH (ref 65–99)
Glucose-Capillary: 143 mg/dL — ABNORMAL HIGH (ref 65–99)
Glucose-Capillary: 151 mg/dL — ABNORMAL HIGH (ref 65–99)
Glucose-Capillary: 169 mg/dL — ABNORMAL HIGH (ref 65–99)

## 2016-07-07 LAB — POCT I-STAT 3, ART BLOOD GAS (G3+)
BICARBONATE: 25.6 meq/L — AB (ref 20.0–24.0)
O2 Saturation: 92 %
PH ART: 7.376 (ref 7.350–7.450)
Patient temperature: 37.2
TCO2: 27 mmol/L (ref 0–100)
pCO2 arterial: 43.9 mmHg (ref 35.0–45.0)
pO2, Arterial: 66 mmHg — ABNORMAL LOW (ref 80.0–100.0)

## 2016-07-07 LAB — MAGNESIUM
MAGNESIUM: 2.3 mg/dL (ref 1.7–2.4)
Magnesium: 2.7 mg/dL — ABNORMAL HIGH (ref 1.7–2.4)
Magnesium: 2.2 mg/dL (ref 1.7–2.4)

## 2016-07-07 LAB — POCT I-STAT, CHEM 8
BUN: 6 mg/dL (ref 6–20)
CREATININE: 0.6 mg/dL (ref 0.44–1.00)
Calcium, Ion: 1.35 mmol/L — ABNORMAL HIGH (ref 1.13–1.30)
Chloride: 99 mmol/L — ABNORMAL LOW (ref 101–111)
GLUCOSE: 147 mg/dL — AB (ref 65–99)
HEMATOCRIT: 26 % — AB (ref 36.0–46.0)
HEMOGLOBIN: 8.8 g/dL — AB (ref 12.0–15.0)
POTASSIUM: 4.9 mmol/L (ref 3.5–5.1)
Sodium: 135 mmol/L (ref 135–145)
TCO2: 27 mmol/L (ref 0–100)

## 2016-07-07 LAB — CBC
HCT: 26.8 % — ABNORMAL LOW (ref 36.0–46.0)
HEMATOCRIT: 28.5 % — AB (ref 36.0–46.0)
HEMOGLOBIN: 8.6 g/dL — AB (ref 12.0–15.0)
Hemoglobin: 8.9 g/dL — ABNORMAL LOW (ref 12.0–15.0)
MCH: 29 pg (ref 26.0–34.0)
MCH: 29 pg (ref 26.0–34.0)
MCHC: 32.1 g/dL (ref 30.0–36.0)
MCHC: 31.2 g/dL (ref 30.0–36.0)
MCV: 90.2 fL (ref 78.0–100.0)
MCV: 92.8 fL (ref 78.0–100.0)
PLATELETS: 238 10*3/uL (ref 150–400)
Platelets: 285 10*3/uL (ref 150–400)
RBC: 2.97 MIL/uL — AB (ref 3.87–5.11)
RBC: 3.07 MIL/uL — ABNORMAL LOW (ref 3.87–5.11)
RDW: 13.3 % (ref 11.5–15.5)
RDW: 13.8 % (ref 11.5–15.5)
WBC: 13.7 10*3/uL — AB (ref 4.0–10.5)
WBC: 21.7 10*3/uL — ABNORMAL HIGH (ref 4.0–10.5)

## 2016-07-07 LAB — BASIC METABOLIC PANEL
ANION GAP: 6 (ref 5–15)
BUN: 6 mg/dL (ref 6–20)
CHLORIDE: 103 mmol/L (ref 101–111)
CO2: 26 mmol/L (ref 22–32)
Calcium: 9.4 mg/dL (ref 8.9–10.3)
Creatinine, Ser: 0.64 mg/dL (ref 0.44–1.00)
GFR calc Af Amer: >60 mL/min (ref >60)
GLUCOSE: 97 mg/dL (ref 65–99)
POTASSIUM: 4.1 mmol/L (ref 3.5–5.1)
SODIUM: 135 mmol/L (ref 135–145)

## 2016-07-07 LAB — CREATININE, SERUM
Creatinine, Ser: 0.68 mg/dL (ref 0.44–1.00)
Creatinine, Ser: 0.78 mg/dL (ref 0.44–1.00)
GFR calc Af Amer: >60 mL/min (ref >60)
GFR calc Af Amer: >60 mL/min (ref >60)
GFR calc non Af Amer: >60 mL/min (ref >60)
GFR calc non Af Amer: >60 mL/min (ref >60)

## 2016-07-07 MED ORDER — MORPHINE SULFATE (PF) 2 MG/ML IV SOLN
1.0000 mg | INTRAVENOUS | Status: DC | PRN
  Administered 2016-07-07 – 2016-07-08 (×5): 2 mg via INTRAVENOUS
  Filled 2016-07-07 (×5): qty 1

## 2016-07-07 MED ORDER — LEVALBUTEROL HCL 1.25 MG/0.5ML IN NEBU
1.2500 mg | INHALATION_SOLUTION | Freq: Four times a day (QID) | RESPIRATORY_TRACT | Status: DC
  Administered 2016-07-07 – 2016-07-08 (×4): 1.25 mg via RESPIRATORY_TRACT
  Filled 2016-07-07 (×4): qty 0.5

## 2016-07-07 MED ORDER — INSULIN ASPART 100 UNIT/ML ~~LOC~~ SOLN
0.0000 [IU] | SUBCUTANEOUS | Status: DC
  Administered 2016-07-07: 4 [IU] via SUBCUTANEOUS
  Administered 2016-07-07 – 2016-07-08 (×3): 2 [IU] via SUBCUTANEOUS

## 2016-07-07 MED ORDER — INSULIN DETEMIR 100 UNIT/ML ~~LOC~~ SOLN
20.0000 [IU] | Freq: Two times a day (BID) | SUBCUTANEOUS | Status: DC
  Administered 2016-07-07 – 2016-07-10 (×7): 20 [IU] via SUBCUTANEOUS
  Filled 2016-07-07 (×11): qty 0.2

## 2016-07-07 NOTE — Progress Notes (Signed)
Rapid Wean Protocol begun - 3rd attempt. RT and RN at bedside.

## 2016-07-07 NOTE — Procedures (Signed)
Extubation Procedure Note  Patient Details:   Name: Erika [name] DOB: March 03, 1971 MRN: [id number]   Airway Documentation:     Evaluation  O2 sats: stable throughout Complications: No apparent complications Patient did tolerate procedure well. Bilateral Breath Sounds: Clear, Diminished   Yes   Nif -30, VC 0.68, positive cuff leak. Post extubation - pt placed on [place] 4Lpm with humidity, no stridor noted, ELink called but did not respond.  Bayard Beaver 07/07/2016, 7:42 AM

## 2016-07-07 NOTE — Progress Notes (Signed)
EKG CRITICAL VALUE     12 lead EKG performed.  Critical value noted. Marita [place], RN notified.   Yehuda Mao, CCT 07/07/2016 9:20 AM

## 2016-07-07 NOTE — Progress Notes (Signed)
TCTS BRIEF SICU PROGRESS NOTE  1 Day Post-Op  S/P Procedure(s) (LRB): CORONARY ARTERY BYPASS GRAFTING (CABG) x four, using left internal mammary artery and right leg greater saphenous vein harvested endoscopically ([name]) TRANSESOPHAGEAL ECHOCARDIOGRAM (TEE) ([name]) ENDARTERECTOMY CAROTID (Right)   Stable day NSR w/ stable BP off drips Breathing comfortably w/ O2 sats 90-96% on 4 L/min UOP adequate Labs okay  Plan: Continue routine care  Rexene Alberts, MD 07/07/2016 6:27 PM

## 2016-07-07 NOTE — Progress Notes (Signed)
   Daily Progress Note  Assessment/Planning: POD #1 s/p R CEA/CABG x 4 v   Neuro intact this AM  High bifurcation per Dr. Donnetta Hutching so careful when starting PO.  Might need swallow study if coughing with intake  Dr. Donnetta Hutching back on Monday   Subjective  - 1 Day Post-Op  Pain ok  Objective Filed Vitals:   07/07/16 0710 07/07/16 0735 07/07/16 0800 07/07/16 0900  BP: 101/65 101/65 121/71 126/73  Pulse: 90 90 89 89  Temp:   98.8 F (37.1 C) 98.4 F (36.9 C)  TempSrc:      Resp: 15 29 19    Height: 5\' 7"  (1.702 m)     Weight:      SpO2: 94% 95% 96% 96%    Intake/Output Summary (Last 24 hours) at 07/07/16 0943 Last data filed at 07/07/16 0900  Gross per 24 hour  Intake 10198.97 ml  Output   5630 ml  Net 4568.97 ml    R neck bandaged Neuro mid-line tongue, sym upper and lower motor, 5/5  Laboratory CBC    Component Value Date/Time   WBC 13.7* 07/07/2016 0440   WBC 15.3* 01/11/2015 1022   HGB 8.6* 07/07/2016 0440   HGB 15.9 01/11/2015 1022   HCT 26.8* 07/07/2016 0440   HCT 48.0* 01/11/2015 1022   PLT 238 07/07/2016 0440   PLT 356 01/11/2015 1022    BMET    Component Value Date/Time   NA 135 07/07/2016 0440   NA 135* 01/11/2015 1022   K 4.1 07/07/2016 0440   K 3.9 01/11/2015 1022   CL 103 07/07/2016 0440   CL 100 01/11/2015 1022   CO2 26 07/07/2016 0440   CO2 27 01/11/2015 1022   GLUCOSE 97 07/07/2016 0440   GLUCOSE 183* 01/11/2015 1022   BUN 6 07/07/2016 0440   BUN 6* 01/11/2015 1022   CREATININE 0.64 07/07/2016 0440   CREATININE 0.77 01/11/2015 1022   CALCIUM 9.4 07/07/2016 0440   CALCIUM 10.0 01/11/2015 1022   GFRNONAA >60 07/07/2016 0440   GFRNONAA >60 01/11/2015 1022   GFRNONAA 41* 08/10/2014 1459   GFRAA >60 07/07/2016 0440   GFRAA >60 01/11/2015 [place]* 08/10/2014 [place], MD [facility] Office: [phone] Pager: [phone]  07/07/2016, 9:43 AM

## 2016-07-07 NOTE — Progress Notes (Addendum)
[address]       [place] [address]             [phone]        CARDIOTHORACIC SURGERY PROGRESS NOTE   R1 Day Post-Op Procedure(s) (LRB): CORONARY ARTERY BYPASS GRAFTING (CABG) x four, using left internal mammary artery and right leg greater saphenous vein harvested endoscopically ([name]) TRANSESOPHAGEAL ECHOCARDIOGRAM (TEE) ([name]) ENDARTERECTOMY CAROTID (Right)  Subjective: Looks good.  Expected soreness in chest.  Some numbness ulnar nerve distribution left hand.  No SOB  Objective: Vital signs: BP Readings from Last 1 Encounters:  07/07/16 125/77   Pulse Readings from Last 1 Encounters:  07/07/16 89   Resp Readings from Last 1 Encounters:  07/07/16 19   Temp Readings from Last 1 Encounters:  07/07/16 98.1 F (36.7 C)     Hemodynamics: PAP: (23-41)/(12-31) 36/23 mmHg CO:  [4.1 L/min-7.5 L/min] 5.8 L/min CI:  [2 L/min/m2-3.6 L/min/m2] 2.8 L/min/m2  Physical Exam:  Rhythm:   sinus  Breath sounds: clear  Heart sounds:  RRR  Incisions:  Dressings dry, intact  Abdomen:  Soft, non-distended, non-tender  Extremities:  Warm, well-perfused  Chest tubes:  decreasing volume thin serosanguinous output, no air leak    Intake/Output from previous day: 07/14 0701 - 07/15 0700 In: 10003.2 [I.V.:6956.2; Blood:637; NG/GT:60; IV Piggyback:2350] Out: 5530 [Urine:3270; Blood:1900; Chest Tube:360] Intake/Output this shift: Total I/O In: 558.6 [P.O.:125; I.V.:383.6; IV Piggyback:50] Out: 235 [Urine:175; Chest Tube:60]  Lab Results:  CBC: Recent Labs  07/06/16 2310 07/06/16 2316 07/07/16 0440  WBC 15.8*  --  13.7*  HGB 9.2* 9.2* 8.6*  HCT 28.4* 27.0* 26.8*  PLT 265  --  238    BMET:  Recent Labs  07/06/16 0222  07/06/16 2316 07/07/16 0440  NA 136  < > 138 135  K 4.4  < > 4.3 4.1  CL 95*  --  99* 103  CO2 30  --   --  26  GLUCOSE 132*  < > 125* 97  BUN 8  --  6 6  CREATININE 0.90  < > 0.70 0.64  CALCIUM 10.8*  --   --  9.4  < > =  values in this interval not displayed.   PT/INR:   Recent Labs  07/06/16 1700  LABPROT 16.8*  INR 1.35    CBG (last 3)   Recent Labs  07/07/16 0815 07/07/16 0919 07/07/16 1025  GLUCAP 119* 111* 169*    ABG    Component Value Date/Time   PHART 7.376 07/07/2016 0714   PCO2ART 43.9 07/07/2016 0714   PO2ART 66.0* 07/07/2016 0714   HCO3 25.6* 07/07/2016 0714   TCO2 27 07/07/2016 0714   ACIDBASEDEF 1.0 07/06/2016 2254   O2SAT 92.0 07/07/2016 0714    CXR: Clear.  Mild bibasilar atelectasis  Assessment/Plan: S/P Procedure(s) (LRB): CORONARY ARTERY BYPASS GRAFTING (CABG) x four, using left internal mammary artery and right leg greater saphenous vein harvested endoscopically ([name]) TRANSESOPHAGEAL ECHOCARDIOGRAM (TEE) ([name]) ENDARTERECTOMY CAROTID (Right)  Overall doing well POD1 Maintaining NSR w/ stable hemodynamics on dopamine, milrinone and neo drips Breathing comfortably w/ O2 sats 95% on 4 L/min Expected post op acute blood loss anemia, stabe Expected post op atelectasis, very mild Expected post op volume excess   Mobilize  Wean drips  D/C lines  D/C tubes later today or tomorrow depending on output  Diuresis  Add levemir insulin and wean drip off  Advance diet slowly   Valentina Gu  Roxy Manns, MD 07/07/2016 11:32 AM

## 2016-07-08 ENCOUNTER — Inpatient Hospital Stay (HOSPITAL_COMMUNITY): Payer: BLUE CROSS/BLUE SHIELD

## 2016-07-08 LAB — CBC
HEMATOCRIT: 25.3 % — AB (ref 36.0–46.0)
HEMOGLOBIN: 8.2 g/dL — AB (ref 12.0–15.0)
MCH: 30.4 pg (ref 26.0–34.0)
MCHC: 32.4 g/dL (ref 30.0–36.0)
MCV: 93.7 fL (ref 78.0–100.0)
Platelets: 253 10*3/uL (ref 150–400)
RBC: 2.7 MIL/uL — AB (ref 3.87–5.11)
RDW: 14.1 % (ref 11.5–15.5)
WBC: 17.1 10*3/uL — AB (ref 4.0–10.5)

## 2016-07-08 LAB — BASIC METABOLIC PANEL
ANION GAP: 4 — AB (ref 5–15)
BUN: 8 mg/dL (ref 6–20)
CALCIUM: 9.5 mg/dL (ref 8.9–10.3)
CO2: 28 mmol/L (ref 22–32)
Chloride: 103 mmol/L (ref 101–111)
Creatinine, Ser: 0.78 mg/dL (ref 0.44–1.00)
Glucose, Bld: 148 mg/dL — ABNORMAL HIGH (ref 65–99)
POTASSIUM: 4.8 mmol/L (ref 3.5–5.1)
SODIUM: 135 mmol/L (ref 135–145)

## 2016-07-08 LAB — GLUCOSE, CAPILLARY
GLUCOSE-CAPILLARY: 156 mg/dL — AB (ref 65–99)
GLUCOSE-CAPILLARY: 149 mg/dL — AB (ref 65–99)
GLUCOSE-CAPILLARY: 154 mg/dL — AB (ref 65–99)
Glucose-Capillary: 151 mg/dL — ABNORMAL HIGH (ref 65–99)

## 2016-07-08 MED ORDER — INSULIN ASPART 100 UNIT/ML ~~LOC~~ SOLN
0.0000 [IU] | Freq: Three times a day (TID) | SUBCUTANEOUS | Status: DC
  Administered 2016-07-08 – 2016-07-13 (×10): 2 [IU] via SUBCUTANEOUS

## 2016-07-08 MED ORDER — LEVALBUTEROL HCL 1.25 MG/0.5ML IN NEBU: 1.2500 mg | INHALATION_SOLUTION | Freq: Four times a day (QID) | RESPIRATORY_TRACT | Status: DC | PRN

## 2016-07-08 MED ORDER — SODIUM CHLORIDE 0.9% FLUSH: 3.0000 mL | INTRAVENOUS | Status: DC | PRN

## 2016-07-08 MED ORDER — MOVING RIGHT ALONG BOOK
Freq: Once | Status: AC
  Administered 2016-07-08: 11:00:00
  Filled 2016-07-08: qty 1

## 2016-07-08 MED ORDER — ENOXAPARIN SODIUM 30 MG/0.3ML ~~LOC~~ SOLN
30.0000 mg | SUBCUTANEOUS | Status: DC
  Administered 2016-07-08 – 2016-07-09 (×2): 30 mg via SUBCUTANEOUS
  Filled 2016-07-08 (×3): qty 0.3

## 2016-07-08 MED ORDER — LAMOTRIGINE 100 MG PO TABS
200.0000 mg | ORAL_TABLET | Freq: Every day | ORAL | Status: DC
  Administered 2016-07-09 – 2016-07-13 (×5): 200 mg via ORAL
  Filled 2016-07-08 (×2): qty 2
  Filled 2016-07-08: qty 1
  Filled 2016-07-08 (×2): qty 2

## 2016-07-08 MED ORDER — SODIUM CHLORIDE 0.9 % IV SOLN: 250.0000 mL | INTRAVENOUS | Status: DC | PRN

## 2016-07-08 MED ORDER — POTASSIUM CHLORIDE CRYS ER 20 MEQ PO TBCR
20.0000 meq | EXTENDED_RELEASE_TABLET | Freq: Every day | ORAL | Status: DC
  Filled 2016-07-08: qty 1

## 2016-07-08 MED ORDER — FUROSEMIDE 40 MG PO TABS
40.0000 mg | ORAL_TABLET | Freq: Once | ORAL | Status: AC
  Administered 2016-07-08: 40 mg via ORAL
  Filled 2016-07-08: qty 1

## 2016-07-08 MED ORDER — ASPIRIN EC 325 MG PO TBEC
325.0000 mg | DELAYED_RELEASE_TABLET | Freq: Every day | ORAL | Status: DC
  Administered 2016-07-08 – 2016-07-13 (×6): 325 mg via ORAL
  Filled 2016-07-08 (×6): qty 1

## 2016-07-08 MED ORDER — FUROSEMIDE 40 MG PO TABS
40.0000 mg | ORAL_TABLET | Freq: Every day | ORAL | Status: DC
  Administered 2016-07-08 – 2016-07-13 (×6): 40 mg via ORAL
  Filled 2016-07-08 (×6): qty 1

## 2016-07-08 MED ORDER — ATORVASTATIN CALCIUM 80 MG PO TABS
80.0000 mg | ORAL_TABLET | Freq: Every day | ORAL | Status: DC
  Administered 2016-07-08 – 2016-07-12 (×5): 80 mg via ORAL
  Filled 2016-07-08 (×5): qty 1

## 2016-07-08 MED ORDER — SODIUM CHLORIDE 0.9% FLUSH
3.0000 mL | Freq: Two times a day (BID) | INTRAVENOUS | Status: DC
  Administered 2016-07-08 – 2016-07-12 (×6): 3 mL via INTRAVENOUS

## 2016-07-08 MED ORDER — CARVEDILOL 12.5 MG PO TABS
12.5000 mg | ORAL_TABLET | Freq: Two times a day (BID) | ORAL | Status: DC
  Administered 2016-07-08 – 2016-07-13 (×10): 12.5 mg via ORAL
  Filled 2016-07-08: qty 2
  Filled 2016-07-08 (×9): qty 1

## 2016-07-08 MED ORDER — CLONAZEPAM 0.5 MG PO TABS
0.5000 mg | ORAL_TABLET | Freq: Three times a day (TID) | ORAL | Status: DC
  Administered 2016-07-08 – 2016-07-13 (×15): 0.5 mg via ORAL
  Filled 2016-07-08 (×15): qty 1

## 2016-07-08 MED ORDER — SPIRONOLACTONE 25 MG PO TABS
25.0000 mg | ORAL_TABLET | Freq: Every day | ORAL | Status: DC
  Administered 2016-07-08 – 2016-07-13 (×6): 25 mg via ORAL
  Filled 2016-07-08 (×6): qty 1

## 2016-07-08 MED ORDER — FLUOXETINE HCL 20 MG PO CAPS
40.0000 mg | ORAL_CAPSULE | Freq: Every day | ORAL | Status: DC
  Administered 2016-07-09 – 2016-07-13 (×5): 40 mg via ORAL
  Filled 2016-07-08 (×6): qty 2

## 2016-07-08 NOTE — Progress Notes (Addendum)
[address]       [place] [address]             [phone]        CARDIOTHORACIC SURGERY PROGRESS NOTE   R2 Days Post-Op Procedure(s) (LRB): CORONARY ARTERY BYPASS GRAFTING (CABG) x four, using left internal mammary artery and right leg greater saphenous vein harvested endoscopically ([name]) TRANSESOPHAGEAL ECHOCARDIOGRAM (TEE) ([name]) ENDARTERECTOMY CAROTID (Right)  Subjective: Feels sore in chest and neck.  Aline is bothering her.  Ambulated around SICU.  No SOB  Objective: Vital signs: BP Readings from Last 1 Encounters:  07/08/16 132/76   Pulse Readings from Last 1 Encounters:  07/08/16 73   Resp Readings from Last 1 Encounters:  07/08/16 18   Temp Readings from Last 1 Encounters:  07/08/16 97.8 F (36.6 C) Oral    Hemodynamics: PAP: (36-43)/(23-26) 43/26 mmHg  Physical Exam:  Rhythm:   sinus  Breath sounds: clear  Heart sounds:  RRR  Incisions:  Dressings dry, intact  Abdomen:  Soft, non-distended, non-tender  Extremities:  Warm, well-perfused    Intake/Output from previous day: 07/15 0701 - 07/16 0700 In: 892.3 [P.O.:250; I.V.:442.3; IV Piggyback:200] Out: 1705 [Urine:1425; Chest Tube:280] Intake/Output this shift: Total I/O In: 125 [P.O.:125] Out: 195 [Urine:195]  Lab Results:  CBC: Recent Labs  07/07/16 1750 07/08/16 0427  WBC 21.7* 17.1*  HGB 8.9* 8.2*  HCT 28.5* 25.3*  PLT 285 253    BMET:  Recent Labs  07/07/16 0440 07/07/16 1744 07/07/16 1750 07/08/16 0427  NA 135 135  --  135  K 4.1 4.9  --  4.8  CL 103 99*  --  103  CO2 26  --   --  28  GLUCOSE 97 147*  --  148*  BUN 6 6  --  8  CREATININE 0.64 0.60 0.78 0.78  CALCIUM 9.4  --   --  9.5     PT/INR:   Recent Labs  07/06/16 1700  LABPROT 16.8*  INR 1.35    CBG (last 3)   Recent Labs  07/07/16 2318 07/08/16 0409 07/08/16 0803  GLUCAP 126* 156* 149*    ABG    Component Value Date/Time   PHART 7.376 07/07/2016 0714   PCO2ART 43.9  07/07/2016 0714   PO2ART 66.0* 07/07/2016 0714   HCO3 25.6* 07/07/2016 0714   TCO2 27 07/07/2016 1744   ACIDBASEDEF 1.0 07/06/2016 2254   O2SAT 92.0 07/07/2016 0714    CXR: PORTABLE CHEST 1 VIEW  COMPARISON: One-view chest 07/07/2016.  FINDINGS: The patient has been extubated. The Swan-Ganz catheter was removed. Left IJ sheath remains. The NG tube was removed as well. A left-sided chest tube and mediastinal drains have been removed.  Median sternotomy CABG is noted. Heart is enlarged. Mild edema and bilateral pleural effusions have increased slightly. Bibasilar airspace disease has also increased. Low lung volumes are again noted.  IMPRESSION: 1. Interval extubation and removal of NG tube, Swan-Ganz catheter, left chest tube, and mediastinal drain. 2. Cardiomegaly with slight increase in interstitial edema and bilateral effusions. 3. Bibasilar airspace disease likely reflects atelectasis.   Electronically Signed  By: San Morelle M.D.  On: 07/08/2016 08:02   Assessment/Plan: S/P Procedure(s) (LRB): CORONARY ARTERY BYPASS GRAFTING (CABG) x four, using left internal mammary artery and right leg greater saphenous vein harvested endoscopically ([name]) TRANSESOPHAGEAL ECHOCARDIOGRAM (TEE) ([name]) ENDARTERECTOMY CAROTID (Right)  Overall doing well POD2 Maintaining NSR w/ stable BP Breathing comfortably w/ O2 sats 92-94%  on 4 L/min Expected post op acute blood loss anemia, stabe Expected post op atelectasis, very mild Expected post op volume excess Tobacco abuse Type II diabetes mellitus, excellent glycemic control   Mobilize  Diuresis  Pulm toilet  D/C Aline and foley  Continue levemir and change CBG's and SSI to ac/hs  Restart carvedilol at reduced dose  Continue to hold ACE-I for now  Transfer step down   Rexene Alberts, MD 07/08/2016 10:47 AM

## 2016-07-08 NOTE — Progress Notes (Signed)
TCTS BRIEF SICU PROGRESS NOTE  2 Days Post-Op  S/P Procedure(s) (LRB): CORONARY ARTERY BYPASS GRAFTING (CABG) x four, using left internal mammary artery and right leg greater saphenous vein harvested endoscopically ([name]) TRANSESOPHAGEAL ECHOCARDIOGRAM (TEE) ([name]) ENDARTERECTOMY CAROTID (Right)   Stable day although O2 sats dropped this afternoon Currently breathing comfortably w/ O2 sats 99% on 6 L/min via FM NSR w/ stable BP Diuresing very well  Plan: Extra dose lasix.  Pulm toilet.  Keep in SICU for now  Rexene Alberts, MD 07/08/2016 7:06 PM

## 2016-07-09 ENCOUNTER — Inpatient Hospital Stay (HOSPITAL_COMMUNITY): Payer: BLUE CROSS/BLUE SHIELD

## 2016-07-09 ENCOUNTER — Encounter (HOSPITAL_COMMUNITY): Payer: Self-pay | Admitting: Cardiothoracic Surgery

## 2016-07-09 LAB — POCT I-STAT, CHEM 8
BUN: 8 mg/dL (ref 6–20)
BUN: 7 mg/dL (ref 6–20)
BUN: 8 mg/dL (ref 6–20)
BUN: 8 mg/dL (ref 6–20)
BUN: 8 mg/dL (ref 6–20)
BUN: 8 mg/dL (ref 6–20)
CALCIUM ION: 1.16 mmol/L (ref 1.13–1.30)
CALCIUM ION: 1.16 mmol/L (ref 1.13–1.30)
CHLORIDE: 97 mmol/L — AB (ref 101–111)
CREATININE: 0.7 mg/dL (ref 0.44–1.00)
CREATININE: 0.5 mg/dL (ref 0.44–1.00)
CREATININE: 0.7 mg/dL (ref 0.44–1.00)
CREATININE: 0.6 mg/dL (ref 0.44–1.00)
CREATININE: 0.8 mg/dL (ref 0.44–1.00)
CREATININE: 0.8 mg/dL (ref 0.44–1.00)
Calcium, Ion: 1.3 mmol/L (ref 1.13–1.30)
Calcium, Ion: 1.14 mmol/L (ref 1.13–1.30)
Calcium, Ion: 1.32 mmol/L — ABNORMAL HIGH (ref 1.13–1.30)
Calcium, Ion: 1.31 mmol/L — ABNORMAL HIGH (ref 1.13–1.30)
Chloride: 98 mmol/L — ABNORMAL LOW (ref 101–111)
Chloride: 92 mmol/L — ABNORMAL LOW (ref 101–111)
Chloride: 96 mmol/L — ABNORMAL LOW (ref 101–111)
Chloride: 95 mmol/L — ABNORMAL LOW (ref 101–111)
Chloride: 98 mmol/L — ABNORMAL LOW (ref 101–111)
GLUCOSE: 130 mg/dL — AB (ref 65–99)
GLUCOSE: 169 mg/dL — AB (ref 65–99)
GLUCOSE: 199 mg/dL — AB (ref 65–99)
Glucose, Bld: 164 mg/dL — ABNORMAL HIGH (ref 65–99)
Glucose, Bld: 185 mg/dL — ABNORMAL HIGH (ref 65–99)
Glucose, Bld: 204 mg/dL — ABNORMAL HIGH (ref 65–99)
HCT: 38 % (ref 36.0–46.0)
HCT: 35 % — ABNORMAL LOW (ref 36.0–46.0)
HCT: 28 % — ABNORMAL LOW (ref 36.0–46.0)
HCT: 30 % — ABNORMAL LOW (ref 36.0–46.0)
HEMATOCRIT: 30 % — AB (ref 36.0–46.0)
HEMATOCRIT: 29 % — AB (ref 36.0–46.0)
HEMOGLOBIN: 12.9 g/dL (ref 12.0–15.0)
HEMOGLOBIN: 9.9 g/dL — AB (ref 12.0–15.0)
Hemoglobin: 10.2 g/dL — ABNORMAL LOW (ref 12.0–15.0)
Hemoglobin: 11.9 g/dL — ABNORMAL LOW (ref 12.0–15.0)
Hemoglobin: 9.5 g/dL — ABNORMAL LOW (ref 12.0–15.0)
Hemoglobin: 10.2 g/dL — ABNORMAL LOW (ref 12.0–15.0)
POTASSIUM: 3.5 mmol/L (ref 3.5–5.1)
POTASSIUM: 4.9 mmol/L (ref 3.5–5.1)
Potassium: 4.2 mmol/L (ref 3.5–5.1)
Potassium: 4.2 mmol/L (ref 3.5–5.1)
Potassium: 5.3 mmol/L — ABNORMAL HIGH (ref 3.5–5.1)
Potassium: 6.4 mmol/L (ref 3.5–5.1)
SODIUM: 130 mmol/L — AB (ref 135–145)
Sodium: 137 mmol/L (ref 135–145)
Sodium: 132 mmol/L — ABNORMAL LOW (ref 135–145)
Sodium: 135 mmol/L (ref 135–145)
Sodium: 131 mmol/L — ABNORMAL LOW (ref 135–145)
Sodium: 135 mmol/L (ref 135–145)
TCO2: 33 mmol/L (ref 0–100)
TCO2: 27 mmol/L (ref 0–100)
TCO2: 32 mmol/L (ref 0–100)
TCO2: 30 mmol/L (ref 0–100)
TCO2: 29 mmol/L (ref 0–100)
TCO2: 26 mmol/L (ref 0–100)

## 2016-07-09 LAB — GLUCOSE, CAPILLARY
GLUCOSE-CAPILLARY: 123 mg/dL — AB (ref 65–99)
GLUCOSE-CAPILLARY: 110 mg/dL — AB (ref 65–99)
GLUCOSE-CAPILLARY: 99 mg/dL (ref 65–99)
Glucose-Capillary: 121 mg/dL — ABNORMAL HIGH (ref 65–99)
Glucose-Capillary: 99 mg/dL (ref 65–99)

## 2016-07-09 LAB — BASIC METABOLIC PANEL
Anion gap: 6 (ref 5–15)
BUN: 12 mg/dL (ref 6–20)
CALCIUM: 9.7 mg/dL (ref 8.9–10.3)
CO2: 33 mmol/L — AB (ref 22–32)
CREATININE: 0.79 mg/dL (ref 0.44–1.00)
Chloride: 96 mmol/L — ABNORMAL LOW (ref 101–111)
Glucose, Bld: 103 mg/dL — ABNORMAL HIGH (ref 65–99)
Potassium: 3.9 mmol/L (ref 3.5–5.1)
SODIUM: 135 mmol/L (ref 135–145)

## 2016-07-09 LAB — TYPE AND SCREEN
ABO/RH(D): B POS
Antibody Screen: NEGATIVE
Unit division: 0
Unit division: 0

## 2016-07-09 LAB — CBC
HCT: 24.1 % — ABNORMAL LOW (ref 36.0–46.0)
Hemoglobin: 7.5 g/dL — ABNORMAL LOW (ref 12.0–15.0)
MCH: 29.2 pg (ref 26.0–34.0)
MCHC: 31.1 g/dL (ref 30.0–36.0)
MCV: 93.8 fL (ref 78.0–100.0)
PLATELETS: 280 10*3/uL (ref 150–400)
RBC: 2.57 MIL/uL — ABNORMAL LOW (ref 3.87–5.11)
RDW: 14.2 % (ref 11.5–15.5)
WBC: 18.4 10*3/uL — ABNORMAL HIGH (ref 4.0–10.5)

## 2016-07-09 LAB — POCT I-STAT 3, ART BLOOD GAS (G3+)
ACID-BASE EXCESS: 5 mmol/L — AB (ref 0.0–2.0)
ACID-BASE EXCESS: 1 mmol/L (ref 0.0–2.0)
BICARBONATE: 30.7 meq/L — AB (ref 20.0–24.0)
BICARBONATE: 25.6 meq/L — AB (ref 20.0–24.0)
O2 SAT: 100 %
O2 Saturation: 97 %
PH ART: 7.414 (ref 7.350–7.450)
PO2 ART: 394 mmHg — AB (ref 80.0–100.0)
PO2 ART: 87 mmHg (ref 80.0–100.0)
TCO2: 32 mmol/L (ref 0–100)
TCO2: 27 mmol/L (ref 0–100)
pCO2 arterial: 50.7 mmHg — ABNORMAL HIGH (ref 35.0–45.0)
pCO2 arterial: 40.1 mmHg (ref 35.0–45.0)
pH, Arterial: 7.39 (ref 7.350–7.450)

## 2016-07-09 MED ORDER — POTASSIUM CHLORIDE CRYS ER 20 MEQ PO TBCR
20.0000 meq | EXTENDED_RELEASE_TABLET | Freq: Two times a day (BID) | ORAL | Status: DC
  Administered 2016-07-09 – 2016-07-12 (×7): 20 meq via ORAL
  Filled 2016-07-09 (×7): qty 1

## 2016-07-09 MED ORDER — FERROUS GLUCONATE 324 (38 FE) MG PO TABS
324.0000 mg | ORAL_TABLET | Freq: Two times a day (BID) | ORAL | Status: DC
  Administered 2016-07-09 – 2016-07-12 (×7): 324 mg via ORAL
  Filled 2016-07-09 (×10): qty 1

## 2016-07-09 MED ORDER — PHENOL 1.4 % MT LIQD
1.0000 | OROMUCOSAL | Status: DC | PRN
  Administered 2016-07-09: 1 via OROMUCOSAL
  Filled 2016-07-09: qty 177

## 2016-07-09 MED FILL — Electrolyte-R (PH 7.4) Solution: INTRAVENOUS | Qty: 4000 | Status: AC

## 2016-07-09 MED FILL — Sodium Chloride IV Soln 0.9%: INTRAVENOUS | Qty: 2000 | Status: AC

## 2016-07-09 MED FILL — Lidocaine HCl IV Inj 20 MG/ML: INTRAVENOUS | Qty: 5 | Status: AC

## 2016-07-09 MED FILL — Sodium Bicarbonate IV Soln 8.4%: INTRAVENOUS | Qty: 50 | Status: AC

## 2016-07-09 MED FILL — Mannitol IV Soln 20%: INTRAVENOUS | Qty: 500 | Status: AC

## 2016-07-09 MED FILL — Heparin Sodium (Porcine) Inj 1000 Unit/ML: INTRAMUSCULAR | Qty: 10 | Status: AC

## 2016-07-09 NOTE — Progress Notes (Signed)
3 Days Post-Op Procedure(s) (LRB): CORONARY ARTERY BYPASS GRAFTING (CABG) x four, using left internal mammary artery and right leg greater saphenous vein harvested endoscopically ([name]) TRANSESOPHAGEAL ECHOCARDIOGRAM (TEE) ([name]) ENDARTERECTOMY CAROTID (Right) Subjective:  Complains of right sided back pain with deep breaths.  Objective: Vital signs in last 24 hours: Temp:  [98 F (36.7 C)-98.6 F (37 C)] 98.3 F (36.8 C) (07/17 0742) Pulse Rate:  [65-136] 72 (07/17 0758) Cardiac Rhythm:  [-] Normal sinus rhythm (07/17 0700) Resp:  [14-31] 16 (07/17 0758) BP: (95-140)/(60-105) 120/65 mmHg (07/17 0758) SpO2:  [82 %-100 %] 100 % (07/17 0758) Arterial Line BP: (104-132)/(80-111) 104/80 mmHg (07/16 1300) FiO2 (%):  [35 %] 35 % (07/16 1736) Weight:  [101.2 kg (223 lb 1.7 oz)] 101.2 kg (223 lb 1.7 oz) (07/17 0347)  Hemodynamic parameters for last 24 hours:    Intake/Output from previous day: 07/16 0701 - 07/17 0700 In: 298 [P.O.:245; I.V.:3; IV Piggyback:50] Out: 2205 [Urine:2205] Intake/Output this shift:    General appearance: alert and cooperative Neurologic: intact Heart: regular rate and rhythm, S1, S2 normal, no murmur, click, rub or gallop Lungs: diminished breath sounds bibasilar Extremities: edema mild Wound: chest and leg incisions ok. right neck incision ok Healing abrasion or burn on back just to left of spine.  Lab Results:  Recent Labs  07/08/16 0427 07/09/16 0300  WBC 17.1* 18.4*  HGB 8.2* 7.5*  HCT 25.3* 24.1*  PLT 253 280   BMET:  Recent Labs  07/08/16 0427 07/09/16 0300  NA 135 135  K 4.8 3.9  CL 103 96*  CO2 28 33*  GLUCOSE 148* 103*  BUN 8 12  CREATININE 0.78 0.79  CALCIUM 9.5 9.7    PT/INR:  Recent Labs  07/06/16 1700  LABPROT 16.8*  INR 1.35   ABG    Component Value Date/Time   PHART 7.376 07/07/2016 0714   HCO3 25.6* 07/07/2016 0714   TCO2 27 07/07/2016 1744   ACIDBASEDEF 1.0 07/06/2016 2254   O2SAT 92.0 07/07/2016 0714    CBG (last 3)   Recent Labs  07/08/16 1733 07/08/16 2125 07/09/16 0739  GLUCAP 154* 123* 121*   CLINICAL DATA: Atelectasis.  EXAM: CHEST 2 VIEW  COMPARISON: 07/08/2016.  FINDINGS: Prior CABG. Cardiomegaly with normal pulmonary vascularity. Interim clearing of pulmonary interstitial edema . Low lung volumes with persistent bibasilar atelectasis and/or infiltrates, particular prominent the right lung base. Small right pleural effusion. No pneumothorax.  IMPRESSION: 1. Prior CABG. Cardiomegaly. Interim clearing of pulmonary interstitial edema.  2. Persistent dense right lower lobe atelectasis and/or infiltrate and left base atelectasis and/or infiltrate again noted. Small right pleural effusion again noted .   Electronically Signed  By: Marcello Moores Register  On: 07/09/2016 07:19   Assessment/Plan: S/P Procedure(s) (LRB): CORONARY ARTERY BYPASS GRAFTING (CABG) x four, using left internal mammary artery and right leg greater saphenous vein harvested endoscopically ([name]) TRANSESOPHAGEAL ECHOCARDIOGRAM (TEE) ([name]) ENDARTERECTOMY CAROTID (Right)  She is hemodynamically stable in sinus rhythm.  Bilateral lower lobe atelectasis on CXR and exam: oxygen requirement improved. Continue IS, ambulation, coughing and deep breathing.   Volume excess: weight is 15 lbs over preop. Continue diuresis.  Expected acute postop blood loss anemia: follow and start iron.  Poorly controlled DM with Hgb A1c of 10.4 preop on Metformin. Continue Levemir and SSI.  Will transfer to 2W today.   LOS: 10 days    Gaye Pollack 07/09/2016

## 2016-07-09 NOTE — Progress Notes (Addendum)
  Progress Note    07/09/2016 8:51 AM 3 Days Post-Op  Subjective:  C/o sore throat  afebrile HR 60's-80's NSR [id number] systolic [id number]   Filed Vitals:   07/09/16 0742 07/09/16 0758  BP:  120/65  Pulse:  72  Temp: 98.3 F (36.8 C)   Resp:  16     Physical Exam: Neuro:  In tact Lungs:  Non labored Incision:  Clean and dry with steri strips in tact.    CBC    Component Value Date/Time   WBC 18.4* 07/09/2016 0300   WBC 15.3* 01/11/2015 1022   RBC 2.57* 07/09/2016 0300   RBC 5.42* 01/11/2015 1022   HGB 7.5* 07/09/2016 0300   HGB 15.9 01/11/2015 1022   HCT 24.1* 07/09/2016 0300   HCT 48.0* 01/11/2015 1022   PLT 280 07/09/2016 0300   PLT 356 01/11/2015 1022   MCV 93.8 07/09/2016 0300   MCV 89 01/11/2015 1022   MCH 29.2 07/09/2016 0300   MCH 29.3 01/11/2015 1022   MCHC 31.1 07/09/2016 0300   MCHC 33.1 01/11/2015 1022   RDW 14.2 07/09/2016 0300   RDW 13.8 01/11/2015 1022   LYMPHSABS 4.7* 03/05/2016 1522   LYMPHSABS 3.4 01/11/2014 1312   MONOABS 0.5 03/05/2016 1522   MONOABS 0.7 01/11/2014 1312   EOSABS 0.3 03/05/2016 1522   EOSABS 0.3 01/11/2014 1312   BASOSABS 0.0 03/05/2016 1522   BASOSABS 0.1 01/11/2014 1312    BMET    Component Value Date/Time   NA 135 07/09/2016 0300   NA 135* 01/11/2015 1022   K 3.9 07/09/2016 0300   K 3.9 01/11/2015 1022   CL 96* 07/09/2016 0300   CL 100 01/11/2015 1022   CO2 33* 07/09/2016 0300   CO2 27 01/11/2015 1022   GLUCOSE 103* 07/09/2016 0300   GLUCOSE 183* 01/11/2015 1022   BUN 12 07/09/2016 0300   BUN 6* 01/11/2015 1022   CREATININE 0.79 07/09/2016 0300   CREATININE 0.77 01/11/2015 1022   CALCIUM 9.7 07/09/2016 0300   CALCIUM 10.0 01/11/2015 1022   GFRNONAA >60 07/09/2016 0300   GFRNONAA >60 01/11/2015 1022   GFRNONAA 41* 08/10/2014 1459   GFRAA >60 07/09/2016 0300   GFRAA >60 01/11/2015 1022   GFRAA 48* 08/10/2014 1459     Intake/Output Summary (Last 24 hours) at 07/09/16 0851 Last data filed at  07/09/16 0400  Gross per 24 hour  Intake    173 ml  Output   2145 ml  Net  -1972 ml     Assessment/Plan:  This is a 45 y.o. female who is s/p right CEA/CABGx4 3 Days Post-Op  -pt is doing well this am. -pt neuro exam is in tact -pt has ambulated -she does c/o sore throat.  She is not having any issues swallowing or choking when eating -c/o numbness left 5th finger, which is most likely related to positioning intraoperatively.    Leontine Locket, PA-C [facility] [phone]  I have examined the patient, reviewed and agree with above.  Curt Jews, MD 07/09/2016 12:02 PM

## 2016-07-09 NOTE — Progress Notes (Signed)
Patient ID: Erika [name], female   DOB: November 28, 1971, 46 y.o.   MRN: [id number]  SICU Evening Rounds:  Hemodynamically stable today  No bed available on 2W

## 2016-07-09 NOTE — Op Note (Signed)
NAMEMarland Kitchen  Erika [name], Erika [name] NO.:  [id number]  MEDICAL RECORD NO.:  [id number]  LOCATION:                               FACILITY:  [facility]  PHYSICIAN:  Ivin Poot, M.D.  DATE OF BIRTH:  [birth year]  DATE OF PROCEDURE:  07/06/2016 DATE OF DISCHARGE:                              OPERATIVE REPORT   PROCEDURE: 1. Coronary artery bypass grafting x4 (left internal mammary artery to     left anterior descending coronary artery, saphenous vein graft to     diagonal, saphenous vein graft to circumflex marginal, saphenous     vein graft to right coronary artery posterior descending). 2. Endoscopic harvest of right leg greater saphenous vein.  SURGEON:  Ivin Poot, MD  ASSISTANT:  Valentina Gu SA-C  ANESTHESIA:  General by Dr. Jillyn Hidden.  PREOPERATIVE DIAGNOSIS:  Non ST-elevation myocardial infarction, unstable angina, severe three-vessel coronary artery disease, mild left ventricular dysfunction, severe right carotid stenosis greater than 90%.  POSTOPERATIVE DIAGNOSIS:  Non ST-elevation myocardial infarction, unstable angina, severe three-vessel coronary artery disease, mild left ventricular dysfunction, severe right carotid stenosis greater than 90%.  CLINICAL NOTE:  The patient is a 45 year old, obese, diabetic smoker presented to the hospital with chest pain, positive cardiac enzymes, and was treated with heparin and nitroglycerin.  She underwent cardiac catheterization, echocardiogram.  Catheterization showed mild left main stenosis with severe LAD stenosis greater than 95%, severe RCA stenosis 95%, and moderate-to-severe stenosis of a large 1st diagonal and circumflex marginal.  Her preoperative evaluation for CABG also included carotid Doppler ultrasound which demonstrated a greater than 90% stenosis of the right internal carotid artery.  The plan was to proceed with multivessel CABG with combined right carotid endarterectomy performed by Dr. Sherren Mocha  Early at the same operation.  Prior to surgery, I discussed with her the procedure of CABG for treatment of her CAD.  I discussed the potential risks of stroke, MI, bleeding, blood transfusion requirement, postoperative pulmonary problems including pleural effusion, infection, and death.  She demonstrated her understanding and agreed to proceed with surgery under what I felt was an informed consent.  OPERATIVE FINDINGS: 1. Suboptimal targets due to diabetic pattern of disease. 2. Adequate but small saphenous vein conduit. 3. No blood products required for the surgery.  OPERATIVE PROCEDURE:  The patient was brought to the operating room and placed supine on the operating table.  General anesthesia was induced. A transesophageal echo probe was placed by the anesthesiologist.  The patient was prepped and draped as a sterile field.  Dr. Donnetta Hutching did a right neck incision and right carotid endarterectomy dictated in a separate note.  After the carotid endarterectomy had been completed, a proper time-out for the CABG was completed.  A sternal incision was made as the saphenous vein was harvested endoscopically from the right leg.  The left internal mammary artery was harvested as a pedicle graft from its origin at the subclavian vessels.  It was a small 1.4-mm vessel, but had good flow.  The sternal retractor was placed using the blades because of the patient's obese body habitus.  The pericardium was opened and suspended.  Pursestrings were placed in ascending aorta and right atrium with  difficulty because of the patient's deep body habitus and the shift to the right of her right atrium.  When heparin was administered and the ACT was therapeutic, the patient was cannulated and placed on cardiopulmonary bypass.  The coronaries were identified for grafting and the mammary artery and vein grafts were prepared for the distal anastomoses.  Cardioplegia cannulas were placed both antegrade  and retrograde cold blood cardioplegia and the patient was cooled to 32 degrees.  Aortic crossclamp was applied.  One liter of cold blood cardioplegia was delivered in split doses between the antegrade aortic and retrograde coronary sinus catheters.  There was good cardioplegic arrest and septal temperature dropped less than 14 degrees. Cardioplegia was delivered every 20 minutes or less.  The distal coronary anastomoses were performed.  The first distal anastomosis was to the proximal posterior descending branch of the right coronary.  The vein was of good quality.  A reverse saphenous vein was sewn end-to-side with running 7-0 Prolene with good flow through the graft.  Cardioplegia was redosed.  The second distal anastomosis was to the circumflex marginal branch of the left coronary.  This was a 1.3-mm vessel.  A reverse saphenous vein of adequate, but not great quality was sewn end-to-side with running 7-0 Prolene.  There was good flow through the graft.  Cardioplegia was redosed.  The third distal anastomosis was to the large diagonal branch to LAD. This had an ostial 80% stenosis.  A reverse saphenous vein of good quality was sewn end-to-side with running 7-0 Prolene with good flow through the graft.  Cardioplegia was redosed.  Fourth distal anastomosis was to the mid LAD.  The LAD was a small vessel 1.4-1.5 mm.  The left IMA pedicle was brought through an opening and the left lateral pericardium was brought down onto the LAD and sewn end-to-side with running 8-0 Prolene.  There was good flow through the anastomosis after briefly releasing the pedicle bulldog on the mammary artery.  The bulldog was reapplied.  The pedicle was secured to the epicardium.  Cardioplegia was redosed.  While the crossclamp was still in place, 2 proximal vein anastomoses were performed on the ascending aorta using a 4.5 mm punch running 6-0 Prolene.  The proximal anastomosis of the circumflex  marginal was placed to the diagonal vein graft as the circumflex marginal vein graft was very small and thin and anastomosing the vessel directly to the thickened aorta would have been problematic.  Retrograde warm blood cardioplegia was delivered to remove any intercoronary left-sided air and the crossclamp was removed.  The vein grafts were de-aired and opened, each had good flow.  The heart resumed a spontaneous rhythm.  We then used a vascular Fogarty clamps to occlude the vein graft to the diagonal to perform an end-to-side anastomosis with the vein graft to the OM branch and when this was completed, the bulldogs were all removed.  There was good flow in the graft. Hemostasis was documented in the proximal distal anastomoses.  Temporary pacing wires were applied.  The patient was rewarmed and reperfused. The lungs were expanded, ventilator was resumed.  The patient was then weaned off cardiopulmonary bypass without difficulty.  Echo showed good LV function.  Protamine was administered without adverse reaction.  The cannulas were removed.  The mediastinum was irrigated.  The superior pericardial fat was closed over the aorta.  Anterior mediastinal left pleural chest tubes were placed and brought out through separate incisions.  The sternum was closed with wire.  Pectoralis  fascia was closed with a running #1 Vicryl and the subcutaneous and skin layers were closed in running Vicryl.  The right neck incision was then closed in layers using Vicryl.  Sterile dressings were applied and the chest tubes were placed to Pleur-Evac drainage suction.  Total cardiopulmonary bypass time was 152 minutes.     Ivin Poot, M.D.   ______________________________ Ivin Poot, M.D.    PV/MEDQ  D:  07/06/2016  T:  07/07/2016  Job:  [id number]  cc:   Quay Burow, M.D.

## 2016-07-10 LAB — BASIC METABOLIC PANEL
Anion gap: 9 (ref 5–15)
BUN: 15 mg/dL (ref 6–20)
CHLORIDE: 96 mmol/L — AB (ref 101–111)
CO2: 29 mmol/L (ref 22–32)
CREATININE: 0.81 mg/dL (ref 0.44–1.00)
Calcium: 9.5 mg/dL (ref 8.9–10.3)
GFR calc Af Amer: >60 mL/min (ref >60)
GFR calc non Af Amer: >60 mL/min (ref >60)
GLUCOSE: 86 mg/dL (ref 65–99)
Potassium: 3.9 mmol/L (ref 3.5–5.1)
SODIUM: 134 mmol/L — AB (ref 135–145)

## 2016-07-10 LAB — GLUCOSE, CAPILLARY
GLUCOSE-CAPILLARY: 92 mg/dL (ref 65–99)
GLUCOSE-CAPILLARY: 83 mg/dL (ref 65–99)
Glucose-Capillary: 77 mg/dL (ref 65–99)
Glucose-Capillary: 70 mg/dL (ref 65–99)

## 2016-07-10 LAB — CBC
HCT: 22.5 % — ABNORMAL LOW (ref 36.0–46.0)
HEMOGLOBIN: 7.1 g/dL — AB (ref 12.0–15.0)
MCH: 29.6 pg (ref 26.0–34.0)
MCHC: 31.6 g/dL (ref 30.0–36.0)
MCV: 93.8 fL (ref 78.0–100.0)
Platelets: 340 10*3/uL (ref 150–400)
RBC: 2.4 MIL/uL — ABNORMAL LOW (ref 3.87–5.11)
RDW: 14.3 % (ref 11.5–15.5)
WBC: 18.1 10*3/uL — AB (ref 4.0–10.5)

## 2016-07-10 MED ORDER — ENOXAPARIN SODIUM 40 MG/0.4ML ~~LOC~~ SOLN
40.0000 mg | Freq: Every day | SUBCUTANEOUS | Status: DC
  Administered 2016-07-10: 40 mg via SUBCUTANEOUS
  Filled 2016-07-10: qty 0.4

## 2016-07-10 NOTE — Progress Notes (Signed)
4 Days Post-Op Procedure(s) (LRB): CORONARY ARTERY BYPASS GRAFTING (CABG) x four, using left internal mammary artery and right leg greater saphenous vein harvested endoscopically ([name]) TRANSESOPHAGEAL ECHOCARDIOGRAM (TEE) ([name]) ENDARTERECTOMY CAROTID (Right) Subjective: C/o back pain  Objective: Vital signs in last 24 hours: Temp:  [97.9 F (36.6 C)-98.4 F (36.9 C)] 97.9 F (36.6 C) (07/18 0810) Pulse Rate:  [57-95] 62 (07/18 0810) Cardiac Rhythm:  [-] Normal sinus rhythm (07/18 0810) Resp:  [12-28] 16 (07/18 0810) BP: (98-130)/(51-79) 128/73 mmHg (07/18 0810) SpO2:  [87 %-100 %] 97 % (07/18 0810) Weight:  [221 lb 1.9 oz (100.3 kg)] 221 lb 1.9 oz (100.3 kg) (07/18 0500)  Hemodynamic parameters for last 24 hours:    Intake/Output from previous day: 07/17 0701 - 07/18 0700 In: 480 [P.O.:480] Out: 600 [Urine:600] Intake/Output this shift: Total I/O In: -  Out: 125 [Urine:125]  General appearance: alert, cooperative and no distress Neurologic: intact Heart: regular rate and rhythm Lungs: diminished breath sounds bibasilar Abdomen: normal findings: soft, non-tender Wound: clean and dry  Lab Results:  Recent Labs  07/09/16 0300 07/09/16 2354  WBC 18.4* 18.1*  HGB 7.5* 7.1*  HCT 24.1* 22.5*  PLT 280 340   BMET:  Recent Labs  07/09/16 0300 07/09/16 2354  NA 135 134*  K 3.9 3.9  CL 96* 96*  CO2 33* 29  GLUCOSE 103* 86  BUN 12 15  CREATININE 0.79 0.81  CALCIUM 9.7 9.5    PT/INR: No results for input(s): LABPROT, INR in the last 72 hours. ABG    Component Value Date/Time   PHART 7.376 07/07/2016 0714   HCO3 25.6* 07/07/2016 0714   TCO2 27 07/07/2016 1744   ACIDBASEDEF 1.0 07/06/2016 2254   O2SAT 92.0 07/07/2016 0714   CBG (last 3)   Recent Labs  07/09/16 1231 07/09/16 1709 07/09/16 2135  GLUCAP 99 110* 99    Assessment/Plan: S/P Procedure(s) (LRB): CORONARY ARTERY BYPASS GRAFTING (CABG) x four, using left internal mammary artery and right  leg greater saphenous vein harvested endoscopically ([name]) TRANSESOPHAGEAL ECHOCARDIOGRAM (TEE) ([name]) ENDARTERECTOMY CAROTID (Right) Plan for transfer to step-down: see transfer orders  Awaiting bed on 2 west Anemia secondary to ABL- HCt down a little again today. She is young and tolerating well, was started on Fe yesterday. Will follow for now - recheck in AM Mobilize CBG well controlled   LOS: 11 days    Melrose Nakayama 07/10/2016

## 2016-07-11 LAB — GLUCOSE, CAPILLARY
GLUCOSE-CAPILLARY: 78 mg/dL (ref 65–99)
GLUCOSE-CAPILLARY: 136 mg/dL — AB (ref 65–99)
GLUCOSE-CAPILLARY: 122 mg/dL — AB (ref 65–99)
Glucose-Capillary: 143 mg/dL — ABNORMAL HIGH (ref 65–99)

## 2016-07-11 LAB — CBC
HCT: 21.9 % — ABNORMAL LOW (ref 36.0–46.0)
HEMOGLOBIN: 7 g/dL — AB (ref 12.0–15.0)
MCH: 29.5 pg (ref 26.0–34.0)
MCHC: 32 g/dL (ref 30.0–36.0)
MCV: 92.4 fL (ref 78.0–100.0)
PLATELETS: 417 10*3/uL — AB (ref 150–400)
RBC: 2.37 MIL/uL — AB (ref 3.87–5.11)
RDW: 14.1 % (ref 11.5–15.5)
WBC: 14.7 10*3/uL — AB (ref 4.0–10.5)

## 2016-07-11 LAB — BASIC METABOLIC PANEL
ANION GAP: 9 (ref 5–15)
BUN: 11 mg/dL (ref 6–20)
CO2: 30 mmol/L (ref 22–32)
Calcium: 9.7 mg/dL (ref 8.9–10.3)
Chloride: 97 mmol/L — ABNORMAL LOW (ref 101–111)
Creatinine, Ser: 0.71 mg/dL (ref 0.44–1.00)
Glucose, Bld: 71 mg/dL (ref 65–99)
POTASSIUM: 3.8 mmol/L (ref 3.5–5.1)
SODIUM: 136 mmol/L (ref 135–145)

## 2016-07-11 MED ORDER — ENOXAPARIN SODIUM 40 MG/0.4ML ~~LOC~~ SOLN
40.0000 mg | Freq: Every day | SUBCUTANEOUS | Status: DC
  Administered 2016-07-12: 40 mg via SUBCUTANEOUS
  Filled 2016-07-11: qty 0.4

## 2016-07-11 MED ORDER — DM-GUAIFENESIN ER 30-600 MG PO TB12
1.0000 | ORAL_TABLET | Freq: Two times a day (BID) | ORAL | Status: DC
  Administered 2016-07-11 – 2016-07-13 (×5): 1 via ORAL
  Filled 2016-07-11 (×5): qty 1

## 2016-07-11 MED ORDER — ENSURE ENLIVE PO LIQD
237.0000 mL | Freq: Three times a day (TID) | ORAL | Status: DC
  Administered 2016-07-11 – 2016-07-13 (×6): 237 mL via ORAL

## 2016-07-11 MED ORDER — POTASSIUM CHLORIDE CRYS ER 20 MEQ PO TBCR
20.0000 meq | EXTENDED_RELEASE_TABLET | Freq: Once | ORAL | Status: AC
  Administered 2016-07-11: 20 meq via ORAL
  Filled 2016-07-11: qty 1

## 2016-07-11 MED ORDER — METFORMIN HCL 500 MG PO TABS
500.0000 mg | ORAL_TABLET | Freq: Every day | ORAL | Status: DC
  Administered 2016-07-12 – 2016-07-13 (×2): 500 mg via ORAL
  Filled 2016-07-11 (×2): qty 1

## 2016-07-11 NOTE — Discharge Summary (Signed)
Physician Discharge Summary       [address]       [place] [address]             [phone]    Patient ID: Erika [name] MRN: [id number] DOB/AGE: Oct 23, 1971 45 y.o.  Admit date: 06/29/2016 Discharge date: 07/13/2016  Admission Diagnoses: 1. S/p NSTEMI (non-ST elevated myocardial infarction) ([place]) 2. Multivessel CAD  Active Diagnoses:  1. Right carotid artery stenosis 2. HLD (hyperlipidemia) 3. HTN (hypertension), malignant 4. ADJUSTMENT DISORDER WITH MIXED FEATURES 5. Agoraphobia with panic attacks 6. MDD (major depressive disorder) ([place]) 7. Tobacco abuse 8. Uncontrolled type 2 diabetes mellitus with complication, without long-term current use of insulin ([place]) 9. ABL anemia  Consults: vascular surgery  Procedure (s):  1. Coronary artery bypass grafting x4 (left internal mammary artery to  left anterior descending coronary artery, saphenous vein graft to diagonal, saphenous vein graft to circumflex marginal, saphenous  vein graft to right coronary artery posterior descending). 2. Endoscopic harvest of right leg greater saphenous vein by Dr. Prescott Gum on 07/06/2016.  3.Right carotid endarterectomy and Dacron patch angioplasty by Dr. Donnetta Hutching on 07/06/2016.  History of Presenting Illness: This is a 45 year old Caucasian female diabetic smoker is admitted for her first episode of heart disease. For the past 2 weeks, she has had atypical upper back pain shoulder pain and arm pain nausea shortness of breath and fatigue. Her family physician checked her EKG and troponin. The troponin was elevated at 1.0 and she was admitted to [facility]. She has history of severe hypertension and is followed by nephrology. CTA of the thoracic aorta was negative for dissection. The patient was transferred to [facility]. Echocardiogram showed LVH with good LV function no significant valvular disease no abnormality of the aortic root. Cardiac catheterization was  performed today which demonstrates 95% stenosis approximately, 95% stenosis the RCA, 90% stenosis of the circumflex. LV gram was not performed because of her renal history. LVEDP was 22 mmHg.  A cardiothoracic consultation was obtained with Dr. Prescott Gum for the consideration of coronary artery bypass grafting surgery. Potential risks, benefits, and complications were discussed with the patient and she agreed to proceed with surgery. During her pre operative work up, she was found to have an 80-99% right internal carotid artery stenosis. A vascular consult was obtained with Dr. Donnetta Hutching. He discussed the need for right carotid endarterectomy. Potential risks, benefits, and complications were discussed with the patient and she agreed to proceed with surgery. She underwent a right carotid endarterectomy with patch angioplasty by Dr. Donnetta Hutching followed by a CABG x 4 by Dr. Prescott Gum.  Brief Hospital Course:  The patient was extubated the evening of surgery without difficulty. She remained afebrile and hemodynamically stable. Gordy Councilman, a line, chest tubes, and foley were removed early in the post operative course. Coreg was started and titrated accordingly. She was volume over loaded and diuresed. She had ABL anemia. She did not require a post op transfusion. Her last H and H was 7 and 21.9. She was put on Trinsicon. She was weaned off the insulin drip. Once she was tolerating a diet, home Metformin was restarted.  The patient was felt surgically stable for transfer from the ICU to [facility] for further convalescence on 07/10/2016. She continues to progress with cardiac rehab. She was ambulating on room air. She has been tolerating a diet and has had a bowel movement. Epicardial pacing wires were removed on 07/11/2016. Chest tube sutures will  be removed today. The patient is felt surgically stable for discharge today.   Latest Vital Signs: Blood pressure 126/58, pulse 60, temperature 98.8 F (37.1 C), temperature source  Oral, resp. rate 18, height 5\' 7"  (1.702 m), weight 218 lb 11.1 oz (99.2 kg), last menstrual period 06/21/2016, SpO2 93 %.  Physical Exam: Cardiovascular: RRR Pulmonary: Clear to auscultation bilaterally; no rales, wheezes, or rhonchi. Abdomen: Soft, non tender, bowel sounds present. Extremities: Blateral lower extremity edema. Ecchymosis right thigh and partial lower leg Wounds: Clean and dry. No erythema or signs of infection.  Discharge Condition:Stable and discharged to home.  Recent laboratory studies:  Lab Results  Component Value Date   WBC 14.7* 07/11/2016   HGB 7.0* 07/11/2016   HCT 21.9* 07/11/2016   MCV 92.4 07/11/2016   PLT 417* 07/11/2016   Lab Results  Component Value Date   NA 136 07/11/2016   K 3.8 07/11/2016   CL 97* 07/11/2016   CO2 30 07/11/2016   CREATININE 0.71 07/11/2016   GLUCOSE 71 07/11/2016    Diagnostic Studies:   Ct Angio Neck W Or Wo Contrast  06/30/2016  CLINICAL DATA:  45 year old female with poorly controlled diabetes and hypertension. Three vessel coronary disease with planned for CABG carotid ultrasound suggesting critical stenosis in the right ICA. Initial encounter. EXAM: CT ANGIOGRAPHY NECK TECHNIQUE: Multidetector CT imaging of the neck was performed using the standard protocol during bolus administration of intravenous contrast. Multiplanar CT image reconstructions and MIPs were obtained to evaluate the vascular anatomy. Carotid stenosis measurements (when applicable) are obtained utilizing NASCET criteria, using the distal internal carotid diameter as the denominator. CONTRAST:  50 mL Isovue 370 COMPARISON:  [facility] Head CT without contrast 01/11/2014, and earlier FINDINGS: Skeleton: Intermittent poor dentition. Straightening of cervical lordosis. No acute osseous abnormality identified. Visualized paranasal sinuses and mastoids are well pneumatized. Other neck: Negative lung apices. No superior mediastinal  lymphadenopathy. Negative thyroid, larynx, pharynx, parapharyngeal spaces, retropharyngeal space, sublingual space, submandibular glands, and parotid glands. No cervical lymphadenopathy. Aortic arch: 4 vessel arch configuration, the left vertebral artery arises directly off the arch but is heavily diseased as detailed below. Calcified arch atherosclerosis. Right carotid system: Soft plaque in the right brachiocephalic artery and right CCA origin without hemodynamically significant stenosis. Somewhat bulky soft plaque along the ventral right CCA (series 401, image 41) without hemodynamically significant stenosis. Bulky mostly soft plaque occupying the right ICA origin and bulb resulting in high-grade radiographic string sign stenosis over a segment of about 8 mm as seen on series 401, image 60, series 404, image 72, and series 406, image 13). Despite this the right ICA remains patent, and appears otherwise negative to the skullbase. The visible right ICA siphon is patent and negative. Left carotid system: Soft plaque at the left CCA origin with less than 50% stenosis with respect to the distal vessel. Circumferential soft plaque in the left CCA proximal to the bifurcation without stenosis. Soft and calcified plaque at the left carotid bifurcation and involving the left ICA origin and bulb, but no subsequent stenosis. Negative cervical left ICA otherwise. Visible left ICA siphon is patent but with moderate calcified plaque (series 401, image 102). Vertebral arteries: Soft plaque at the right subclavian artery origin resulting in up to 50% stenosis. Right vertebral artery origin is mildly stenotic due to soft plaque. The right vertebral artery is dominant. There is a mild stenosis in the distal V1 segment as seen on series 403, image 134. Otherwise the  right vertebral artery is normal to the skullbase. Intracranially however there is severe stenosis of the right V4 segment at the PICA origin (series 401, image 85).  Despite this the distal right vertebral remains patent to the junction with the basilar. The visible basilar artery is negative. The left vertebral artery arises directly from the arch, but is severely diseased and appears occluded in the V1 segment. Flow is reconstituted in the left V2 segment beginning at the C5 level, but the left vertebral artery is diminutive until the C2 level. There is then mild irregularity and stenosis in the left V3 segment, and moderate to severe stenosis in the proximal left V4 segment (series 402, image 164. There is calcified plaque near the left PICA origin which remains patent. There is mild to moderate irregularity and mild stenosis at the left vertebrobasilar junction. IMPRESSION: 1. Very age advanced atherosclerosis. Bulky soft plaque resulting in RADIOGRAPHIC STRING SIGN stenosis of the right ICA bulb over a segment of about 8 mm. 2. Widespread left carotid atherosclerosis but no hemodynamically significant stenosis. Left ICA siphon calcified plaque partially visible. 3. Significant posterior circulation atherosclerosis and stenosis: The left vertebral artery arises directly from the arch, but its origin and the left V1 segment are occluded. Flow is reconstituted in the left V2 segment. Mild right vertebral artery origin stenosis, but extensive bilateral V4 vertebral atherosclerosis with moderate severe stenosis. Despite this the vertebrobasilar junction remains patent. Electronically Signed   By: Genevie Ann M.D.   On: 06/30/2016 15:27   Dg Chest Port 1 View  07/08/2016  CLINICAL DATA:  Atelectasis.  Extubated yesterday. EXAM: PORTABLE CHEST 1 VIEW COMPARISON:  One-view chest 07/07/2016. FINDINGS: The patient has been extubated. The Swan-Ganz catheter was removed. Left IJ sheath remains. The NG tube was removed as well. A left-sided chest tube and mediastinal drains have been removed. Median sternotomy CABG is noted. Heart is enlarged. Mild edema and bilateral pleural effusions  have increased slightly. Bibasilar airspace disease has also increased. Low lung volumes are again noted. IMPRESSION: 1. Interval extubation and removal of NG tube, Swan-Ganz catheter, left chest tube, and mediastinal drain. 2. Cardiomegaly with slight increase in interstitial edema and bilateral effusions. 3. Bibasilar airspace disease likely reflects atelectasis. Electronically Signed   By: San Morelle M.D.   On: 07/08/2016 08:02   Ct Angio Chest/abd/pel For Dissection W And/or W/wo  06/28/2016  EXAM: CT ANGIOGRAPHY CHEST, ABDOMEN, PELVIS WITH CONTRAST TECHNIQUE: Multidetector CT imaging of the chest, abdomen, pelvis was performed using the standard protocol during bolus administration of intravenous contrast. Multiplanar CT image reconstructions and MIPs were obtained to evaluate the vascular anatomy. CONTRAST:  100 mL Isovue 370 IV COMPARISON:  05/28/2009 FINDINGS: CHEST Vascular: Left arm IV contrast administration. Innominate vein and SVC are patent. Right atrium and right ventricle are nondilated. Satisfactory opacification of pulmonary arteries noted, and there is no evidence of pulmonary emboli. Patent bilateral pulmonary veins drain into the left atrium. Scattered coronary calcifications most evident on the noncontrast scan. Adequate contrast opacification of the thoracic aorta with no evidence of dissection, aneurysm, or stenosis. There is patent brachiocephalic arch anatomy without proximal stenosis. Partially calcified atheromatous plaque in the aortic arch and descending segment. Mediastinum/Lymph Nodes: No masses or pathologically enlarged lymph nodes identified. No pericardial effusion. Lungs/Pleura: 39mm noncalcified nodule, superior segment right lower lobe image 20/3. Subsegmental dependent atelectasis or scarring posteriorly in both lower lobes. No pleural effusion. No pneumothorax. Musculoskeletal: No chest wall mass or suspicious bone lesions identified.  ABDOMEN Arterial Aorta:  Moderate atheromatous plaque particularly in the infrarenal segment, without aneurysm, dissection, or stenosis. Celiac axis:          Patent Superior mesenteric:  Patent Left renal: Minimal ostial plaque, nonocclusive. Patent distally. Right renal:          Patent Inferior mesenteric:  Patent Left iliac: Partially calcified plaque through the common and internal iliac arteries without high-grade stenosis, aneurysm or dissection. External iliac patent. Common femoral patent. Right iliac: Mild common iliac and proximal internal iliac plaque. No aneurysm, dissection, or high-grade stenosis. Venous Dedicated venous phase imaging not obtained. Patent bilateral renal veins, portal vein, splenic vein. Review of the MIP images confirms the above findings. Nonvasular Hepatobiliary: No masses or other significant abnormality. Pancreas: No mass, inflammatory changes, or other significant abnormality. Spleen: Within normal limits in size and appearance. Adrenals/Urinary Tract: No masses identified. No evidence of hydronephrosis. Stomach/Bowel: No evidence of obstruction, inflammatory process, or abnormal fluid collections. Normal appendix. Scattered diverticula, most numerous in the distal descending segment, without significant adjacent inflammatory/edematous change. Lymphatic: No pathologically enlarged lymph nodes. Reproductive: No mass or other significant abnormality. Other: No ascites.  No free air. Musculoskeletal: Obesity. Stable discogenic sclerosis adjacent to the inferior plate of L4. Negative for fracture or other acute bone lesion. Subcutaneous foci of gas and fluid in the anterior right lower quadrant and left lower quadrant body wall presumably related to recent injections. Tiny umbilical hernia containing only mesenteric fat. IMPRESSION: 1. Negative for aortic dissection or pulmonary emboli. 2. 6 mm nonspecific nodule, superior segment right lower lobe. Non-contrast chest CT at 6-12 months is recommended. If the  nodule is stable at time of repeat CT, then future CT at 18-24 months (from today's scan) is considered optional for low-risk patients, but is recommended for high-risk patients. This recommendation follows the consensus statement: Guidelines for Management of Incidental Pulmonary Nodules Detected on CT Images:From the Fleischner Society 2017; published online before print (10.1148/radiol.IJ:2314499). 3. Atherosclerosis, including Aortic Atherosclerosis (ICD10-170.0), iliac and coronary artery disease. Please note that although the presence of coronary artery calcium documents the presence of coronary artery disease, the severity of this disease and any potential stenosis cannot be assessed on this non-gated CT examination. Assessment for potential risk factor modification, dietary therapy or pharmacologic therapy may be warranted, if clinically indicated. 4. Colonic diverticulosis. Electronically Signed   By: Lucrezia Europe M.D.   On: 06/28/2016 12:40     Discharge Medications:   Medication List    STOP taking these medications        enalapril 20 MG tablet  Commonly known as:  VASOTEC     hydrochlorothiazide 25 MG tablet  Commonly known as:  HYDRODIURIL     HYDROcodone-acetaminophen 5-325 MG tablet  Commonly known as:  NORCO     simvastatin 20 MG tablet  Commonly known as:  ZOCOR     verapamil 120 MG tablet  Commonly known as:  CALAN      TAKE these medications        aspirin 325 MG EC tablet  Take 1 tablet (325 mg total) by mouth daily.     atorvastatin 80 MG tablet  Commonly known as:  LIPITOR  Take 1 tablet (80 mg total) by mouth daily at 6 PM.     carvedilol 12.5 MG tablet  Commonly known as:  COREG  Take 1 tablet (12.5 mg total) by mouth 2 (two) times daily.     clonazePAM 0.5 MG tablet  Commonly  known as:  KLONOPIN  Take 1 tablet (0.5 mg total) by mouth 3 (three) times daily.     ferrous gluconate 324 MG tablet  Commonly known as:  FERGON  Take 1 tablet (324 mg total)  by mouth daily with breakfast. For one month then stop.     FLUoxetine 40 MG capsule  Commonly known as:  PROZAC  Take 1 capsule (40 mg total) by mouth daily.     furosemide 40 MG tablet  Commonly known as:  LASIX  Take 1 tablet (40 mg total) by mouth daily. For one week then stop.     lamoTRIgine 200 MG tablet  Commonly known as:  LAMICTAL  Take 1 tablet (200 mg total) by mouth daily.     metFORMIN 500 MG tablet  Commonly known as:  GLUCOPHAGE  Take 500 mg by mouth daily with breakfast.     oxyCODONE 5 MG immediate release tablet  Commonly known as:  Oxy IR/ROXICODONE  Take 1-2 tablets (5-10 mg total) by mouth every 4 (four) hours as needed for severe pain.     spironolactone 25 MG tablet  Commonly known as:  ALDACTONE  Take 1 tablet (25 mg total) by mouth daily.       The patient has been discharged on:   1.Beta Blocker:  Yes [x   ]                              No   [   ]                              If No, reason:  2.Ace Inhibitor/ARB: Yes [   ]                                     No  [  x  ]                                     If No, reason:Labile BP  3.Statin:   Yes [ x  ]                  No  [   ]                  If No, reason:  4.Ecasa:  Yes  [ x  ]                  No   [   ]                  If No, reason:  Follow Up Appointments: Follow-up Information    Follow up with [name], MD On 07/27/2016.   Specialty:  Cardiology   Why:  [facility]iology Hospital Follow-Up on 07/27/2016 at 2:30PM.   Contact information:   [place] [address] [phone]       Follow up with Ivin Poot III, MD On 08/15/2016.   Specialty:  Cardiothoracic Surgery   Why:  PA/LAT CXR to be taken (at [facility] which is in the same building as Dr. Lucianne Lei Trigt's office) on 08/15/2016 at 12:00 pm ;Appointment time is at 12:30 pm  Contact information:   [address] [place] [address] [phone]       Follow up  with Curt Jews, MD On 08/07/2016.   Specialties:  Vascular Surgery, Cardiology   Why:  Appointment time is at 8:45 am   Contact information:   [place] [address] [phone]       Follow up with Arnette Norris, MD.   Specialty:  Family Medicine   Why:  Call for a follow up appointment for further management of diabetes and surveillance of [facility]   Contact information:   [place]  [id number] [phone]       Signed: Stormy Connon MPA-C 07/13/2016, 7:40 AM

## 2016-07-11 NOTE — Progress Notes (Addendum)
      [address]       [place] [address]             [phone]        5 Days Post-Op Procedure(s) (LRB): CORONARY ARTERY BYPASS GRAFTING (CABG) x four, using left internal mammary artery and right leg greater saphenous vein harvested endoscopically ([name]) TRANSESOPHAGEAL ECHOCARDIOGRAM (TEE) ([name]) ENDARTERECTOMY CAROTID (Right)  Subjective: Patient is very "wiped out" this am. She also has had a sore throat (from ET tube, no signs of thrush).  Objective: Vital signs in last 24 hours: Temp:  [97.3 F (36.3 C)-98.2 F (36.8 C)] 97.3 F (36.3 C) (07/19 0458) Pulse Rate:  [62-77] 77 (07/19 0458) Cardiac Rhythm:  [-] Normal sinus rhythm (07/18 1900) Resp:  [17-25] 20 (07/19 0458) BP: (113-139)/(56-73) 127/56 mmHg (07/19 0458) SpO2:  [93 %-97 %] 93 % (07/19 0458) Weight:  [220 lb 9.6 oz (100.064 kg)] 220 lb 9.6 oz (100.064 kg) (07/19 0458)  Pre op weight 95 kg Current Weight  07/11/16 220 lb 9.6 oz (100.064 kg)       Intake/Output from previous day: 07/18 0701 - 07/19 0700 In: 303 [P.O.:300; I.V.:3] Out: 475 [Urine:475]   Physical Exam:  Cardiovascular: RRR Pulmonary: Clear to auscultation bilaterally; no rales, wheezes, or rhonchi. Abdomen: Soft, non tender, bowel sounds present. Extremities: Blateral lower extremity edema. Ecchymosis right thigh and partial lower leg Wounds: Clean and dry.  No erythema or signs of infection.  Lab Results: CBC: Recent Labs  07/09/16 2354 07/11/16 0346  WBC 18.1* 14.7*  HGB 7.1* 7.0*  HCT 22.5* 21.9*  PLT 340 417*   BMET:  Recent Labs  07/09/16 2354 07/11/16 0346  NA 134* 136  K 3.9 3.8  CL 96* 97*  CO2 29 30  GLUCOSE 86 71  BUN 15 11  CREATININE 0.81 0.71  CALCIUM 9.5 9.7    PT/INR:  Lab Results  Component Value Date   INR 1.35 07/06/2016   INR 1.07 06/30/2016   INR 1.04 06/27/2016   ABG:  INR: Will add last result for INR, ABG once components are confirmed Will add last 4 CBG  results once components are confirmed  Assessment/Plan:  1. CV - SR in the 70's. On Coreg 12.5 mg bid and Spironolactone 25 mg daily. 2.  Pulmonary - On room air. Encourage incentive spirometer. 3. Volume Overload - On Lasix 40 mg daily 4.  Acute blood loss anemia - H and H decreased to 7 and 21.9 Continue Fergon. Will discuss with surgeon if would benefit from 1 U PRBC 5. Supplement potassium 6. DM-CBGs 77/70/78. On Insulin. Was on Metformin pre op. Will stop scheduled Insulin and restart Metformin. Will need follow up with medical doctor.  7. Ensure chocolate tid 8. Remove EPW  ZIMMERMAN,DONIELLE MPA-C 07/11/2016,8:15 AM  Hemoglobin stable at 7.0-continue oral iron 5 pounds over baseline weight-continue Lasix Chest x-ray with mild atelectasis Continue ambulation with cardiac rehabilitation Name for discharge Friday on oral iron Tharon Aquas trigt M.D.

## 2016-07-11 NOTE — Progress Notes (Signed)
CARDIAC REHAB PHASE I   PRE:  Rate/Rhythm: 73 SR  BP:  Supine:   Sitting: 140/72  Standing:    SaO2: 93%RA forehead  MODE:  Ambulation: 170 ft   POST:  Rate/Rhythm: 76  BP:  Supine:   Sitting: 148/72  Standing:    SaO2: 91%RA forehead 1055-1132 Got probe to check sats on forehead as dark green fingernail polish and reading 86%.. Forehead sats at 93% prior to walk. Pt walked 170 ft holding to side rail as she stated did not need walker. Pt c/o slight lightheadedness. Pt requested to be wheeled back to room at 170 ft as she stated could not make it back. Stated she walked too far yesterday and has not recovered yet. Wheeled to room and then walked to bathroom and to bed for pacing wire removal.   Graylon Good, RN BSN  07/11/2016 11:28 AM

## 2016-07-11 NOTE — Progress Notes (Signed)
07/11/2016 11:40 AM EPW D/C'd per order and per protocol.  Ends intact. Pt. Tolerated well.  Advised bedrest x1hr.  Call bell in reach.  Vital signs collected per protocol. [place]

## 2016-07-11 NOTE — Discharge Instructions (Signed)
Coronary Artery Bypass Grafting, Care After Refer to this sheet in the next few weeks. These instructions provide you with information on caring for yourself after your procedure. Your health care provider may also give you more specific instructions. Your treatment has been planned according to current medical practices, but problems sometimes occur. Call your health care provider if you have any problems or questions after your procedure. WHAT TO EXPECT AFTER THE PROCEDURE Recovery from surgery will be different for everyone. Some people feel well after 3 or 4 weeks, while for others it takes longer. After your procedure, it is typical to have the following:  Nausea and a lack of appetite.   Constipation.  Weakness and fatigue.   Depression or irritability.   Pain or discomfort at your incision site. HOME CARE INSTRUCTIONS  Take medicines only as directed by your health care provider. Do not stop taking medicines or start any new medicines without first checking with your health care provider.  Take your pulse as directed by your health care provider.  Perform deep breathing as directed by your health care provider. If you were given a device called an incentive spirometer, use it to practice deep breathing several times a day. Support your chest with a pillow or your arms when you take deep breaths or cough.  Keep incision areas clean, dry, and protected. Remove or change any bandages (dressings) only as directed by your health care provider. You may have skin adhesive strips over the incision areas. Do not take the strips off. They will fall off on their own.  Check incision areas daily for any swelling, redness, or drainage.  If incisions were made in your legs, do the following:  Avoid crossing your legs.   Avoid sitting for long periods of time. Change positions every 30 minutes.   Elevate your legs when you are sitting.  Wear compression stockings as directed by your  health care provider. These stockings help keep blood clots from forming in your legs.  Take showers once your health care provider approves. Until then, only take sponge baths. Pat incisions dry. Do not rub incisions with a washcloth or towel. Do not take baths, swim, or use a hot tub until your health care provider approves.  Eat foods that are high in fiber, such as raw fruits and vegetables, whole grains, beans, and nuts. Meats should be lean cut. Avoid canned, processed, and fried foods.  Drink enough fluid to keep your urine clear or pale yellow.  Weigh yourself every day. This helps identify if you are retaining fluid that may make your heart and lungs work harder.  Rest and limit activity as directed by your health care provider. You may be instructed to:  Stop any activity at once if you have chest pain, shortness of breath, irregular heartbeats, or dizziness. Get help right away if you have any of these symptoms.  Move around frequently for short periods or take short walks as directed by your health care provider. Increase your activities gradually. You may need physical therapy or cardiac rehabilitation to help strengthen your muscles and build your endurance.  Avoid lifting, pushing, or pulling anything heavier than 10 lb (4.5 kg) for at least 6 weeks after surgery.  Do not drive until your health care provider approves.  Ask your health care provider when you may return to work.  Ask your health care provider when you may resume sexual activity.  Keep all follow-up visits as directed by your health care  provider. This is important. SEEK MEDICAL CARE IF:  You have swelling, redness, increasing pain, or drainage at the site of an incision.  You have a fever.  You have swelling in your ankles or legs.  You have pain in your legs.   You gain 2 or more pounds (0.9 kg) a day.  You are nauseous or vomit.  You have diarrhea. SEEK IMMEDIATE MEDICAL CARE IF:  You have  chest pain that goes to your jaw or arms.  You have shortness of breath.   You have a fast or irregular heartbeat.   You notice a "clicking" in your breastbone (sternum) when you move.   You have numbness or weakness in your arms or legs.  You feel dizzy or light-headed.  MAKE SURE YOU:  Understand these instructions.  Will watch your condition.  Will get help right away if you are not doing well or get worse.   This information is not intended to replace advice given to you by your health care provider. Make sure you discuss any questions you have with your health care provider.   Document Released: 06/29/2005 Document Revised: 12/31/2014 Document Reviewed: 05/19/2013 Elsevier Interactive Patient Education 2016 [employer]  Carotid Endarterectomy  A carotid endarterectomy is a surgery to remove a blockage in the carotid arteries. The carotid arteries are the large blood vessels on both sides of the neck that supply blood to the brain. Carotid artery disease, also called carotid artery stenosis, is the narrowing or blockage of one or both carotid arteries. Carotid artery disease is usually caused by atherosclerosis, which is a buildup of fat and plaque in the arteries. Some plaque buildup normally occurs with aging. The plaque may partially or totally block blood flow or cause a clot to form in the carotid arteries.  LET [facility] CARE PROVIDER KNOW ABOUT:  Any allergies you have.   All medicines you are taking, including vitamins, herbs, eye drops, creams, and over-the-counter medicines.   Use of steroids (by mouth or creams).   Previous problems you or members of your family have had with the use of anesthetics.   Any blood disorders you have.   Previous surgeries you have had.   Medical conditions you have, including diabetes and kidney problems.   Possibility of pregnancy, if this applies.  RISKS AND COMPLICATIONS Generally, carotid endarterectomy is a  safe procedure. However, problems can occur and include:  Bleeding.   Infection.   Transient ischemic attacks (TIAs). A TIA results from poor blood flow to the brain, but there is no permanent loss of brain function.   Stroke.   Heart attack (myocardial infarction).   High blood pressure (hypertension).   Injury to nerves near the carotid arteries.  BEFORE THE PROCEDURE   Ask your health care provider about changing or stopping your regular medicines. This is especially important if you are taking diabetes medicines or blood thinners.  Do not eat or drink anything after midnight on the night before the procedure or as directed by your health care provider. Ask your health care provider if it is okay to take any needed medicines with a sip of water.   Do not smoke for as long as possible before the surgery. Smoking will increase the chance of a healing problem after surgery.   You may need to have blood tests, a test to check heart rhythm (electrocardiography), or a test to check blood flow (angiography) done before the surgery.  PROCEDURE   You will  be given a medicine that makes you fall asleep (general anesthetic).  After you receive the anesthetic, the surgeon will make a small cut (incision) in your neck to expose the carotid artery.  A tube may be inserted into the carotid artery above and below the blockage. This tube will temporarily allow blood to flow around the blockage during the surgery.  An incision will be made in the carotid artery at the location of the blockage, and the blockage will then be removed. In some cases, a section of the carotid artery may be removed and a graft patch used to repair the artery.  The carotid artery will then be closed with stitches (sutures).  If a tube was inserted into the artery to allow blood flow around the blockage during surgery, the tube will be removed. With the tube removal, blood flow to the brain will be restored  through the carotid artery.  The incision in the neck will then be closed with sutures. AFTER THE PROCEDURE  You may have some pain or an ache in your neck for a couple weeks. This is normal.    This information is not intended to replace advice given to you by your health care provider. Make sure you discuss any questions you have with your health care provider.   Document Released: 08/12/2013 Document Revised: 12/31/2014 Document Reviewed: 08/12/2013 Elsevier Interactive Patient Education [employer].

## 2016-07-12 ENCOUNTER — Telehealth: Payer: Self-pay | Admitting: Cardiovascular Disease

## 2016-07-12 ENCOUNTER — Telehealth: Payer: Self-pay | Admitting: Vascular Surgery

## 2016-07-12 LAB — GLUCOSE, CAPILLARY
GLUCOSE-CAPILLARY: 127 mg/dL — AB (ref 65–99)
Glucose-Capillary: 110 mg/dL — ABNORMAL HIGH (ref 65–99)
Glucose-Capillary: 147 mg/dL — ABNORMAL HIGH (ref 65–99)
Glucose-Capillary: 148 mg/dL — ABNORMAL HIGH (ref 65–99)

## 2016-07-12 MED ORDER — POTASSIUM CHLORIDE CRYS ER 20 MEQ PO TBCR: 20.0000 meq | EXTENDED_RELEASE_TABLET | Freq: Once | ORAL | Status: AC

## 2016-07-12 NOTE — Telephone Encounter (Addendum)
Pt still currently admitted to [facility] w/expected d/c July 21.

## 2016-07-12 NOTE — Telephone Encounter (Signed)
-----   Message from Mena Goes, RN sent at 07/11/2016 10:56 AM EDT ----- Regarding: 2 weeks postop   ----- Message -----    From: Gabriel Earing, PA-C    Sent: 07/11/2016   9:31 AM      To: [facility]vs Charge Pool  S/p right CEA/CABG. F/u with Dr. Donnetta Hutching in 2 weeks.  Thanks, Aldona Bar

## 2016-07-12 NOTE — Progress Notes (Signed)
      [address]       [place] [address]             [phone]        6 Days Post-Op Procedure(s) (LRB): CORONARY ARTERY BYPASS GRAFTING (CABG) x four, using left internal mammary artery and right leg greater saphenous vein harvested endoscopically ([name]) TRANSESOPHAGEAL ECHOCARDIOGRAM (TEE) ([name]) ENDARTERECTOMY CAROTID (Right)  Subjective: Patient hopes to go home soon. She has no complaints at this time.  Objective: Vital signs in last 24 hours: Temp:  [98.2 F (36.8 C)-98.3 F (36.8 C)] 98.2 F (36.8 C) (07/20 0405) Pulse Rate:  [52-87] 87 (07/20 0853) Cardiac Rhythm:  [-] Normal sinus rhythm (07/20 0700) Resp:  [18] 18 (07/20 0853) BP: (113-140)/(52-70) 120/67 mmHg (07/20 0405) SpO2:  [90 %-94 %] 90 % (07/20 0853) Weight:  [219 lb 11.2 oz (99.655 kg)] 219 lb 11.2 oz (99.655 kg) (07/20 0405)  Pre op weight 95 kg Current Weight  07/12/16 219 lb 11.2 oz (99.655 kg)       Intake/Output from previous day: 07/19 0701 - 07/20 0700 In: 963 [P.O.:960; I.V.:3] Out: -    Physical Exam:  Cardiovascular: RRR Pulmonary: Clear to auscultation bilaterally; no rales, wheezes, or rhonchi. Abdomen: Soft, non tender, bowel sounds present. Extremities: Blateral lower extremity edema. Ecchymosis right thigh and partial lower leg Wounds: Clean and dry.  No erythema or signs of infection.  Lab Results: CBC:  Recent Labs  07/09/16 2354 07/11/16 0346  WBC 18.1* 14.7*  HGB 7.1* 7.0*  HCT 22.5* 21.9*  PLT 340 417*   BMET:   Recent Labs  07/09/16 2354 07/11/16 0346  NA 134* 136  K 3.9 3.8  CL 96* 97*  CO2 29 30  GLUCOSE 86 71  BUN 15 11  CREATININE 0.81 0.71  CALCIUM 9.5 9.7    PT/INR:  Lab Results  Component Value Date   INR 1.35 07/06/2016   INR 1.07 06/30/2016   INR 1.04 06/27/2016   ABG:  INR: Will add last result for INR, ABG once components are confirmed Will add last 4 CBG results once components are  confirmed  Assessment/Plan:  1. CV - SR in the 70's. On Coreg 12.5 mg bid and Spironolactone 25 mg daily. 2.  Pulmonary - On room air. Encourage incentive spirometer. 3. Volume Overload - On Lasix 40 mg daily 4.  Acute blood loss anemia - H and H decreased to 7 and 21.9 Continue Fergon.  5. DM-CBGs 122/143/110.  Continue Metformin.  Will need follow up with medical doctor after discharge. 6. Ensure chocolate tid 7. Likely discharge in am  Shequila Neglia MPA-C 07/12/2016,11:18 AM

## 2016-07-12 NOTE — Telephone Encounter (Signed)
8/4  2:30 Dr. Fletcher Anon  Attempted TCM call. Left message on machine for patient to contact the office.

## 2016-07-12 NOTE — Progress Notes (Signed)
CARDIAC REHAB PHASE I   PRE:  Rate/Rhythm: 79 SR  BP:  Sitting: 131/52        SaO2: 92 RA  MODE:  Ambulation: 310 ft   POST:  Rate/Rhythm: 89 SR  BP:  Sitting: 140/57         SaO2: 93 RA  Pt states she feels a little stronger today, thinks she over did it the day before yesterday. Pt did require moderated assistance to transition from lying to sitting position in bed, however, stood independently. Pt ambulated 310 ft on RA, rollator, assist x1, slow, steady gait, tolerated well. Pt c/o mild DOE, fatigue towards end of walk and muscle soreness in her neck, brief standing rest x2. Encouraged additional ambulation x2 today. Pt to edge of bed per pt request after walk, call bell within reach. Will follow.   [id number]  Lenna Sciara, RN, BSN 07/12/2016 10:39 AM

## 2016-07-12 NOTE — Telephone Encounter (Signed)
Sched appt 8/15 at 8:45. Spoke to pt to inform them of appt.

## 2016-07-13 LAB — GLUCOSE, CAPILLARY: GLUCOSE-CAPILLARY: 123 mg/dL — AB (ref 65–99)

## 2016-07-13 MED ORDER — FERROUS GLUCONATE 324 (38 FE) MG PO TABS: 324.0000 mg | ORAL_TABLET | Freq: Every day | ORAL | Status: DC

## 2016-07-13 MED ORDER — CARVEDILOL 12.5 MG PO TABS: 12.5000 mg | ORAL_TABLET | Freq: Two times a day (BID) | ORAL | Status: DC

## 2016-07-13 MED ORDER — ATORVASTATIN CALCIUM 80 MG PO TABS: 80.0000 mg | ORAL_TABLET | Freq: Every day | ORAL | Status: DC

## 2016-07-13 MED ORDER — OXYCODONE HCL 5 MG PO TABS: 5.0000 mg | ORAL_TABLET | ORAL | Status: DC | PRN

## 2016-07-13 MED ORDER — ASPIRIN 325 MG PO TBEC: 325.0000 mg | DELAYED_RELEASE_TABLET | Freq: Every day | ORAL | Status: DC

## 2016-07-13 MED ORDER — FUROSEMIDE 40 MG PO TABS: 40.0000 mg | ORAL_TABLET | Freq: Every day | ORAL | Status: DC

## 2016-07-13 NOTE — Progress Notes (Signed)
      [address]       [place] [address]             [phone]        7 Days Post-Op Procedure(s) (LRB): CORONARY ARTERY BYPASS GRAFTING (CABG) x four, using left internal mammary artery and right leg greater saphenous vein harvested endoscopically ([name]) TRANSESOPHAGEAL ECHOCARDIOGRAM (TEE) ([name]) ENDARTERECTOMY CAROTID (Right)  Subjective: Patient wants to go home.  Objective: Vital signs in last 24 hours: Temp:  [98.2 F (36.8 C)-98.8 F (37.1 C)] 98.8 F (37.1 C) (07/21 0456) Pulse Rate:  [60-87] 60 (07/21 0456) Cardiac Rhythm:  [-] Sinus bradycardia (07/21 0551) Resp:  [18] 18 (07/21 0456) BP: (109-158)/(56-61) 126/58 mmHg (07/21 0456) SpO2:  [90 %-95 %] 93 % (07/21 0456) Weight:  [218 lb 11.1 oz (99.2 kg)] 218 lb 11.1 oz (99.2 kg) (07/21 0148)  Pre op weight 95 kg Current Weight  07/13/16 218 lb 11.1 oz (99.2 kg)       Intake/Output from previous day: 07/20 0701 - 07/21 0700 In: 1083 [P.O.:1080; I.V.:3] Out: -    Physical Exam:  Cardiovascular: RRR Pulmonary: Clear to auscultation bilaterally Abdomen: Soft, non tender, bowel sounds present. Extremities: Blateral lower extremity edema. Ecchymosis right thigh and partial lower leg Wounds: Clean and dry.  No erythema or signs of infection.  Lab Results: CBC:  Recent Labs  07/11/16 0346  WBC 14.7*  HGB 7.0*  HCT 21.9*  PLT 417*   BMET:   Recent Labs  07/11/16 0346  NA 136  K 3.8  CL 97*  CO2 30  GLUCOSE 71  BUN 11  CREATININE 0.71  CALCIUM 9.7    PT/INR:  Lab Results  Component Value Date   INR 1.35 07/06/2016   INR 1.07 06/30/2016   INR 1.04 06/27/2016   ABG:  INR: Will add last result for INR, ABG once components are confirmed Will add last 4 CBG results once components are confirmed  Assessment/Plan:  1. CV - SR in the 70's. On Coreg 12.5 mg bid and Spironolactone 25 mg daily. 2.  Pulmonary - On room air. Encourage incentive spirometer. 3. Volume  Overload - On Lasix 40 mg daily 4.  Acute blood loss anemia - Last H and H  7 and 21.9 Continue Fergon.  5. DM-CBGs 127/148/123.  Continue Metformin.  Will need follow up with medical doctor after discharge. 6. Discharge  Lakasha Mcfall MPA-C 07/13/2016,7:21 AM

## 2016-07-13 NOTE — Progress Notes (Signed)
[id number] Education completed with pt and mother who voiced understanding. Encouraged IS and flutter valve. Gave pt diabetic and heart healthy diets. Pt has fake cigarette that we have given . Gave smoking cessation handout. Pt stated she has quit. Ex ed completed. Pt did not feel that she needed walker. Discussed CRP 2 and will refer to [place]. Offered discharge video but pt did not want to watch at this time. Graylon Good RN BSN 07/13/2016 10:05 AM

## 2016-07-13 NOTE — Progress Notes (Signed)
CT sutures removed per order and per protocol. Education done with pt and pts mother. No further questions at this time.

## 2016-07-25 ENCOUNTER — Encounter (HOSPITAL_COMMUNITY): Payer: Self-pay

## 2016-07-25 ENCOUNTER — Encounter: Payer: Self-pay | Admitting: Family Medicine

## 2016-07-25 ENCOUNTER — Ambulatory Visit (INDEPENDENT_AMBULATORY_CARE_PROVIDER_SITE_OTHER): Payer: BLUE CROSS/BLUE SHIELD | Admitting: Family Medicine

## 2016-07-25 ENCOUNTER — Ambulatory Visit: Payer: Self-pay | Admitting: Family Medicine

## 2016-07-25 VITALS — BP 144/88 | HR 80 | Temp 98.0°F | Wt 203.2 lb

## 2016-07-25 DIAGNOSIS — Z951 Presence of aortocoronary bypass graft: Secondary | ICD-10-CM | POA: Diagnosis not present

## 2016-07-25 DIAGNOSIS — D62 Acute posthemorrhagic anemia: Secondary | ICD-10-CM | POA: Diagnosis not present

## 2016-07-25 DIAGNOSIS — K5900 Constipation, unspecified: Secondary | ICD-10-CM

## 2016-07-25 DIAGNOSIS — E1169 Type 2 diabetes mellitus with other specified complication: Secondary | ICD-10-CM

## 2016-07-25 DIAGNOSIS — I10 Essential (primary) hypertension: Secondary | ICD-10-CM

## 2016-07-25 DIAGNOSIS — I6523 Occlusion and stenosis of bilateral carotid arteries: Secondary | ICD-10-CM

## 2016-07-25 DIAGNOSIS — I214 Non-ST elevation (NSTEMI) myocardial infarction: Secondary | ICD-10-CM

## 2016-07-25 LAB — CBC WITH DIFFERENTIAL/PLATELET
BASOS ABS: 0.1 10*3/uL (ref 0.0–0.1)
Basophils Relative: 0.5 % (ref 0.0–3.0)
Eosinophils Absolute: 0.3 10*3/uL (ref 0.0–0.7)
Eosinophils Relative: 2.5 % (ref 0.0–5.0)
HEMATOCRIT: 33.7 % — AB (ref 36.0–46.0)
HEMOGLOBIN: 11 g/dL — AB (ref 12.0–15.0)
LYMPHS PCT: 28.1 % (ref 12.0–46.0)
Lymphs Abs: 3 10*3/uL (ref 0.7–4.0)
MCHC: 32.5 g/dL (ref 30.0–36.0)
MCV: 86.2 fl (ref 78.0–100.0)
MONOS PCT: 5.6 % (ref 3.0–12.0)
Monocytes Absolute: 0.6 10*3/uL (ref 0.1–1.0)
NEUTROS ABS: 6.6 10*3/uL (ref 1.4–7.7)
Neutrophils Relative %: 63.3 % (ref 43.0–77.0)
PLATELETS: 820 10*3/uL — AB (ref 150.0–400.0)
RBC: 3.91 Mil/uL (ref 3.87–5.11)
RDW: 14.9 % (ref 11.5–15.5)
WBC: 10.5 10*3/uL (ref 4.0–10.5)

## 2016-07-25 MED ORDER — OXYCODONE HCL 5 MG PO TABS: 5.0000 mg | ORAL_TABLET | ORAL | 0 refills | Status: DC | PRN

## 2016-07-25 NOTE — Patient Instructions (Signed)
Great to see you. Let's add miralax to your stool softener.  I will call you lab results.

## 2016-07-25 NOTE — Progress Notes (Signed)
Pre visit review using our clinic review tool, if applicable. No additional management support is needed unless otherwise documented below in the visit note. 

## 2016-07-25 NOTE — Assessment & Plan Note (Signed)
Recovering well. Has follow up with Dr. Nils Pyle on 8/23.

## 2016-07-25 NOTE — Assessment & Plan Note (Signed)
S/p CEA Has follow up with Dr. Donnetta Hutching on 8/15.

## 2016-07-25 NOTE — Progress Notes (Signed)
Subjective:   Patient ID: Erika [name], female    DOB: Feb 28, 1971, 45 y.o.   MRN: [id number]  Erika [name] is a pleasant 45 y.o. year old female who presents to clinic today with Hospitalization Follow-up  on 07/25/2016  HPI:  Hospital notes, records, labs and studies reviewed.  Discharge summary as follows:  This is a 45 year old Caucasian female diabetic smoker diabetic smoker is admitted for her first episode of heart disease. For the past 2 weeks, she has had atypical upper back pain shoulder pain and arm pain nausea shortness of breath and fatigue. Her family physician checked her EKG and troponin. The troponin was elevated at 1.0 and she was admitted to [facility]. She has history of severe hypertension and is followed by nephrology. CTA of the thoracic aorta was negative for dissection. The patient was transferred to [facility]. Echocardiogram showed LVH with good LV function no significant valvular disease no abnormality of the aortic root. Cardiac catheterization was performed today which demonstrates 95% stenosis approximately, 95% stenosis the RCA, 90% stenosis of the circumflex. LV gram was not performed because of her renal history. LVEDP was 22 mmHg.  A cardiothoracic consultation was obtained with Dr. Prescott Gum for the consideration of coronary artery bypass grafting surgery. Potential risks, benefits, and complications were discussed with the patient and she agreed to proceed with surgery. During her pre operative work up, she was found to have an 80-99% right internal carotid artery stenosis. A vascular consult was obtained with Dr. Donnetta Hutching. He discussed the need for right carotid endarterectomy. Potential risks, benefits, and complications were discussed with the patient and she agreed to proceed with surgery. She underwent a right carotid endarterectomy with patch angioplasty by Dr. Donnetta Hutching followed by a CABG x 4 by Dr. Prescott Gum.  Brief Hospital Course:  The patient  was extubated the evening of surgery without difficulty. She remained afebrile and hemodynamically stable. Gordy Councilman, a line, chest tubes, and foley were removed early in the post operative course. Coreg was started and titrated accordingly. She was volume over loaded and diuresed. She had ABL anemia. She did not require a post op transfusion. Her last H and H was 7 and 21.9. She was put on Trinsicon. She was weaned off the insulin drip. Once she was tolerating a diet, home Metformin was restarted.  The patient was felt surgically stable for transfer from the ICU to [facility] for further convalescence on 07/10/2016. She continues to progress with cardiac rehab. She was ambulating on room air. She has been tolerating a diet and has had a bowel movement. Epicardial pacing wires were removed on 07/11/2016.   Lab Results  Component Value Date   WBC 14.7 (H) 07/11/2016   HGB 7.0 (L) 07/11/2016   HCT 21.9 (L) 07/11/2016   MCV 92.4 07/11/2016   PLT 417 (H) 07/11/2016   Iron is causing constipation even with stool softener.  Blood pressure has never been this well controlled.  She is fatigued and sore but otherwise doing ok.  BP is under much better control now.  Only taking Coreg 12.5 mg twice daily and aldactone.  Current Outpatient Prescriptions on File Prior to Visit  Medication Sig Dispense Refill  . aspirin EC 325 MG EC tablet Take 1 tablet (325 mg total) by mouth daily. 30 tablet 0  . atorvastatin (LIPITOR) 80 MG tablet Take 1 tablet (80 mg total) by mouth daily at 6 PM. 30 tablet 1  . carvedilol (COREG) 12.5 MG tablet  Take 1 tablet (12.5 mg total) by mouth 2 (two) times daily. 60 tablet 1  . clonazePAM (KLONOPIN) 0.5 MG tablet Take 1 tablet (0.5 mg total) by mouth 3 (three) times daily. 90 tablet 2  . ferrous gluconate (FERGON) 324 MG tablet Take 1 tablet (324 mg total) by mouth daily with breakfast. For one month then stop.  3  . FLUoxetine (PROZAC) 40 MG capsule Take 1 capsule (40 mg total) by  mouth daily. 30 capsule 2  . furosemide (LASIX) 40 MG tablet Take 1 tablet (40 mg total) by mouth daily. For one week then stop. 7 tablet 0  . lamoTRIgine (LAMICTAL) 200 MG tablet Take 1 tablet (200 mg total) by mouth daily. 30 tablet 2  . metFORMIN (GLUCOPHAGE) 500 MG tablet Take 500 mg by mouth daily with breakfast.    . oxyCODONE (OXY IR/ROXICODONE) 5 MG immediate release tablet Take 1-2 tablets (5-10 mg total) by mouth every 4 (four) hours as needed for severe pain. 30 tablet 0  . spironolactone (ALDACTONE) 25 MG tablet Take 1 tablet (25 mg total) by mouth daily. 30 tablet 3   No current facility-administered medications on file prior to visit.     Allergies  Allergen Reactions  . No Known Allergies     Past Medical History:  Diagnosis Date  . Arthralgia of temporomandibular joint   . CAD, multiple vessel    a. cath 06/29/16: ostLM 40%, ostLAD 40%, pLAD 95%, ost-pLCx 60%, pLCx 95%, mLCx 60%, mRCA 95%, D2 50%, LVSF nl  . Depression   . Diastolic dysfunction    a.e cho 06/28/16: EF 50-55%, mild inf wall HK, GR1DD, mild MR, RV sys fxn nl, mildly dilated LA, PASP nl  . Fatty liver disease, nonalcoholic [id number]  . HLD (hyperlipidemia)   . Malignant hypertension   . Obesity   . PTSD (post-traumatic stress disorder)   . Tobacco abuse   . Type 2 diabetes mellitus [facility]) January 2017    Past Surgical History:  Procedure Laterality Date  . CARDIAC CATHETERIZATION [name] 06/29/2016   Procedure: Left Heart Cath and Coronary Angiography;  Surgeon: Minna Merritts, MD;  Location: [place] [facility];  Service: Cardiovascular;  Laterality: [name];  . CESAREAN SECTION    . CHOLECYSTECTOMY    . CORONARY ARTERY BYPASS GRAFT [name] 07/06/2016   Procedure: CORONARY ARTERY BYPASS GRAFTING (CABG) x four, using left internal mammary artery and right leg greater saphenous vein harvested endoscopically;  Surgeon: Ivin Poot, MD;  Location: [place];  Service: Open Heart Surgery;  Laterality: [name];  .  ENDARTERECTOMY Right 07/06/2016   Procedure: ENDARTERECTOMY CAROTID;  Surgeon: Rosetta Posner, MD;  Location: [place];  Service: Vascular;  Laterality: Right;  . PERIPHERAL VASCULAR CATHETERIZATION [name] 04/18/2016   Procedure: Renal Angiography;  Surgeon: Wellington Hampshire, MD;  Location: [place] [facility];  Service: Cardiovascular;  Laterality: [name];  . TEE WITHOUT CARDIOVERSION [name] 07/06/2016   Procedure: TRANSESOPHAGEAL ECHOCARDIOGRAM (TEE);  Surgeon: Ivin Poot, MD;  Location: [place];  Service: Open Heart Surgery;  Laterality: [name];  . TONSILLECTOMY      Family History  Problem Relation Age of Onset  . Adopted: Yes  . Diabetes Mother   . Diabetes Father   . Alcohol abuse Father   . Heart disease Father   . Drug abuse Father   . Stroke Sister   . Anxiety disorder Sister     Social History   Social History  . Marital status: Married  Spouse name: [name]  . Number of children: [name]  . Years of education: [name]   Occupational History  . Not on file.   Social History Main Topics  . Smoking status: Current Every Day Smoker    Packs/day: 0.50    Types: Cigarettes  . Smokeless tobacco: Never Used  . Alcohol use No  . Drug use: No  . Sexual activity: Yes    Birth control/ protection: None   Other Topics Concern  . Not on file   Social History Narrative  . No narrative on file   The PMH, PSH, Social History, Family History, Medications, and allergies have been reviewed in [facility], and have been updated if relevant.   Review of Systems  Constitutional: Positive for fatigue.  HENT: Negative.   Respiratory: Negative.   Cardiovascular:       + chest soreness  Gastrointestinal: Positive for constipation.  Musculoskeletal: Negative.   Neurological: Negative.   Psychiatric/Behavioral: Negative.   All other systems reviewed and are negative.      Objective:    BP (!) 144/88   Pulse 80   Temp 98 F (36.7 C) (Oral)   Wt 203 lb 4 oz (92.2 kg)   LMP 06/21/2016   SpO2 97%    BMI 31.83 kg/m    Physical Exam  Constitutional: She is oriented to person, place, and time. She appears well-developed and well-nourished.  HENT:  Head: Normocephalic.  Eyes: Conjunctivae are normal.  Neck:  Right CEA scar healing well  Cardiovascular: Normal rate and regular rhythm.   Large well healing vertical scar and smaller horizontal scars   Pulmonary/Chest: Effort normal and breath sounds normal.  Neurological: She is alert and oriented to person, place, and time. No cranial nerve deficit.  Skin: Skin is warm and dry. She is not diaphoretic.  Psychiatric: She has a normal mood and affect. Her behavior is normal. Judgment and thought content normal.  Nursing note and vitals reviewed.         Assessment & Plan:   S/P CABG x 4  Carotid stenosis, bilateral  Acute blood loss anemia - Plan: CBC with Differential/Platelet  NSTEMI (non-ST elevated myocardial infarction) ([place]) No Follow-up on file.

## 2016-07-25 NOTE — Assessment & Plan Note (Signed)
Continue current rxs. Has follow up with Dr. Fletcher Anon on 08/14/2016

## 2016-07-25 NOTE — Assessment & Plan Note (Signed)
Improved control s/p CABG. Continue current rxs and keep appt with cardiology. The patient indicates understanding of these issues and agrees with the plan.

## 2016-07-27 ENCOUNTER — Encounter: Payer: Self-pay | Admitting: Cardiovascular Disease

## 2016-07-31 ENCOUNTER — Ambulatory Visit (HOSPITAL_COMMUNITY): Payer: Self-pay | Admitting: Psychiatry

## 2016-08-02 ENCOUNTER — Encounter: Payer: Self-pay | Admitting: Vascular Surgery

## 2016-08-07 ENCOUNTER — Ambulatory Visit (INDEPENDENT_AMBULATORY_CARE_PROVIDER_SITE_OTHER): Payer: Self-pay | Admitting: Vascular Surgery

## 2016-08-07 ENCOUNTER — Encounter: Payer: Self-pay | Admitting: Vascular Surgery

## 2016-08-07 VITALS — BP 151/97 | Temp 97.9°F | Resp 18 | Ht 67.0 in | Wt 206.0 lb

## 2016-08-07 DIAGNOSIS — I6523 Occlusion and stenosis of bilateral carotid arteries: Secondary | ICD-10-CM

## 2016-08-07 NOTE — Progress Notes (Signed)
Vitals:   08/07/16 0825 08/07/16 0829 08/07/16 0831 08/07/16 0832  BP: (!) 149/96 (!) 150/92 (!) 152/96 (!) 151/97  Resp: 18     Temp: 97.9 F (36.6 C)     TempSrc: Oral     SpO2: 98%     Weight: 206 lb (93.4 kg)     Height: 5\' 7"  (1.702 m)

## 2016-08-07 NOTE — Progress Notes (Signed)
   Patient name: Erika [name] MRN: [id number] DOB: 06-Jul-1971 Sex: female  REASON FOR VISIT: Follow-up right carotid endarterectomy  HPI: Erika [name] is a 45 y.o. female here today for follow-up of right carotid endarterectomy in conjunction with coronary artery bypass grafting with Dr. Prescott Gum. She had critical stenosis in her right carotid system which was asymptomatic. She did well the hospital was discharged home. Continues to have a soreness related to her sternotomy. Does have a diminished stamina but no other major difficulties. Does have the typical periincisional numbness in front of her right carotid incision.  Current Outpatient Prescriptions  Medication Sig Dispense Refill  . aspirin EC 325 MG EC tablet Take 1 tablet (325 mg total) by mouth daily. 30 tablet 0  . atorvastatin (LIPITOR) 80 MG tablet Take 1 tablet (80 mg total) by mouth daily at 6 PM. 30 tablet 1  . carvedilol (COREG) 12.5 MG tablet Take 1 tablet (12.5 mg total) by mouth 2 (two) times daily. 60 tablet 1  . clonazePAM (KLONOPIN) 0.5 MG tablet Take 1 tablet (0.5 mg total) by mouth 3 (three) times daily. 90 tablet 2  . ferrous gluconate (FERGON) 324 MG tablet Take 1 tablet (324 mg total) by mouth daily with breakfast. For one month then stop.  3  . FLUoxetine (PROZAC) 40 MG capsule Take 1 capsule (40 mg total) by mouth daily. 30 capsule 2  . furosemide (LASIX) 40 MG tablet Take 1 tablet (40 mg total) by mouth daily. For one week then stop. 7 tablet 0  . lamoTRIgine (LAMICTAL) 200 MG tablet Take 1 tablet (200 mg total) by mouth daily. 30 tablet 2  . metFORMIN (GLUCOPHAGE) 500 MG tablet Take 500 mg by mouth daily with breakfast.    . oxyCODONE (OXY IR/ROXICODONE) 5 MG immediate release tablet Take 1-2 tablets (5-10 mg total) by mouth every 4 (four) hours as needed for severe pain. 30 tablet 0  . spironolactone (ALDACTONE) 25 MG tablet Take 1 tablet (25 mg total) by mouth daily.  30 tablet 3   No current facility-administered medications for this visit.      PHYSICAL EXAM: Vitals:   08/07/16 0825 08/07/16 0829 08/07/16 0831 08/07/16 0832  BP: (!) 149/96 (!) 150/92 (!) 152/96 (!) 151/97  Resp: 18     Temp: 97.9 F (36.6 C)     TempSrc: Oral     SpO2: 98%     Weight: 206 lb (93.4 kg)     Height: 5\' 7"  (1.702 m)       GENERAL: The patient is a well-nourished female, in no acute distress. The vital signs are documented above. Right neck incision is completely healed. She does have a Vicryl suture exposed at the upper pole and this was removed. No carotid bruits bilaterally.  Neurologically intact   MEDICAL ISSUES: Stable status post right carotid endarterectomy combined with coronary artery bypass grafting with surgery on 07/06/2016. She will resume full activities. She will see me again in 6 months with repeat carotid duplex. She'll notify should she develop any difficulty  Rosetta Posner, MD [facility] Tel [phone] Pager [phone]

## 2016-08-14 ENCOUNTER — Ambulatory Visit (INDEPENDENT_AMBULATORY_CARE_PROVIDER_SITE_OTHER): Payer: BLUE CROSS/BLUE SHIELD | Admitting: Cardiovascular Disease

## 2016-08-14 ENCOUNTER — Other Ambulatory Visit: Payer: Self-pay | Admitting: Cardiothoracic Surgery

## 2016-08-14 ENCOUNTER — Encounter: Payer: Self-pay | Admitting: Cardiovascular Disease

## 2016-08-14 VITALS — BP 140/96 | HR 97 | Ht 67.0 in | Wt 203.8 lb

## 2016-08-14 DIAGNOSIS — I10 Essential (primary) hypertension: Secondary | ICD-10-CM

## 2016-08-14 DIAGNOSIS — I25110 Atherosclerotic heart disease of native coronary artery with unstable angina pectoris: Secondary | ICD-10-CM

## 2016-08-14 DIAGNOSIS — E785 Hyperlipidemia, unspecified: Secondary | ICD-10-CM | POA: Diagnosis not present

## 2016-08-14 DIAGNOSIS — Z720 Tobacco use: Secondary | ICD-10-CM | POA: Diagnosis not present

## 2016-08-14 DIAGNOSIS — Z951 Presence of aortocoronary bypass graft: Secondary | ICD-10-CM

## 2016-08-14 MED ORDER — SPIRONOLACTONE 25 MG PO TABS: 25.0000 mg | ORAL_TABLET | Freq: Every day | ORAL | 3 refills | Status: DC

## 2016-08-14 NOTE — Patient Instructions (Addendum)
Medication Instructions:  Your physician recommends that you continue on your current medications as directed. Please refer to the Current Medication list given to you today.   Labwork: BMET, Liver and lipid profile in one week. Nothing to eat or drink after midnight the evening before your labs.   Testing/Procedures: none  Follow-Up: Your physician recommends that you schedule a follow-up appointment in: two months with Dr. Fletcher Anon.    Any Other Special Instructions Will Be Listed Below (If Applicable).     If you need a refill on your cardiac medications before your next appointment, please call your pharmacy.  Smoking Cessation, Tips for Success If you are ready to quit smoking, congratulations! You have chosen to help yourself be healthier. Cigarettes bring nicotine, tar, carbon monoxide, and other irritants into your body. Your lungs, heart, and blood vessels will be able to work better without these poisons. There are many different ways to quit smoking. Nicotine gum, nicotine patches, a nicotine inhaler, or nicotine nasal spray can help with physical craving. Hypnosis, support groups, and medicines help break the habit of smoking. WHAT THINGS CAN I DO TO MAKE QUITTING EASIER?  Here are some tips to help you quit for good:  Pick a date when you will quit smoking completely. Tell all of your friends and family about your plan to quit on that date.  Do not try to slowly cut down on the number of cigarettes you are smoking. Pick a quit date and quit smoking completely starting on that day.  Throw away all cigarettes.   Clean and remove all ashtrays from your home, work, and car.  On a card, write down your reasons for quitting. Carry the card with you and read it when you get the urge to smoke.  Cleanse your body of nicotine. Drink enough water and fluids to keep your urine clear or pale yellow. Do this after quitting to flush the nicotine from your body.  Learn to predict  your moods. Do not let a bad situation be your excuse to have a cigarette. Some situations in your life might tempt you into wanting a cigarette.  Never have "just one" cigarette. It leads to wanting another and another. Remind yourself of your decision to quit.  Change habits associated with smoking. If you smoked while driving or when feeling stressed, try other activities to replace smoking. Stand up when drinking your coffee. Brush your teeth after eating. Sit in a different chair when you read the paper. Avoid alcohol while trying to quit, and try to drink fewer caffeinated beverages. Alcohol and caffeine may urge you to smoke.  Avoid foods and drinks that can trigger a desire to smoke, such as sugary or spicy foods and alcohol.  Ask people who smoke not to smoke around you.  Have something planned to do right after eating or having a cup of coffee. For example, plan to take a walk or exercise.  Try a relaxation exercise to calm you down and decrease your stress. Remember, you may be tense and nervous for the first 2 weeks after you quit, but this will pass.  Find new activities to keep your hands busy. Play with a pen, coin, or rubber band. Doodle or draw things on paper.  Brush your teeth right after eating. This will help cut down on the craving for the taste of tobacco after meals. You can also try mouthwash.   Use oral substitutes in place of cigarettes. Try using lemon drops, carrots, cinnamon  sticks, or chewing gum. Keep them handy so they are available when you have the urge to smoke.  When you have the urge to smoke, try deep breathing.  Designate your home as a nonsmoking area.  If you are a heavy smoker, ask your health care provider about a prescription for nicotine chewing gum. It can ease your withdrawal from nicotine.  Reward yourself. Set aside the cigarette money you save and buy yourself something nice.  Look for support from others. Join a support group or smoking  cessation program. Ask someone at home or at work to help you with your plan to quit smoking.  Always ask yourself, "Do I need this cigarette or is this just a reflex?" Tell yourself, "Today, I choose not to smoke," or "I do not want to smoke." You are reminding yourself of your decision to quit.  Do not replace cigarette smoking with electronic cigarettes (commonly called e-cigarettes). The safety of e-cigarettes is unknown, and some may contain harmful chemicals.  If you relapse, do not give up! Plan ahead and think about what you will do the next time you get the urge to smoke. HOW WILL I FEEL WHEN I QUIT SMOKING? You may have symptoms of withdrawal because your body is used to nicotine (the addictive substance in cigarettes). You may crave cigarettes, be irritable, feel very hungry, cough often, get headaches, or have difficulty concentrating. The withdrawal symptoms are only temporary. They are strongest when you first quit but will go away within 10-14 days. When withdrawal symptoms occur, stay in control. Think about your reasons for quitting. Remind yourself that these are signs that your body is healing and getting used to being without cigarettes. Remember that withdrawal symptoms are easier to treat than the major diseases that smoking can cause.  Even after the withdrawal is over, expect periodic urges to smoke. However, these cravings are generally short lived and will go away whether you smoke or not. Do not smoke! WHAT RESOURCES ARE AVAILABLE TO HELP ME QUIT SMOKING? Your health care provider can direct you to community resources or hospitals for support, which may include:  Group support.  Education.  Hypnosis.  Therapy.   This information is not intended to replace advice given to you by your health care provider. Make sure you discuss any questions you have with your health care provider.   Document Released: 09/07/2004 Document Revised: 12/31/2014 Document Reviewed:  05/28/2013 Elsevier Interactive Patient Education [employer].

## 2016-08-14 NOTE — Progress Notes (Signed)
Cardiology Office Note   Date:  08/14/2016   ID:  Erika, [name] 03/19/1971, MRN [id number]  PCP:  Arnette Norris, MD Cardiologist:   [name], MD   Chief Complaint  Patient presents with  . Coronary Artery Disease    pain in breastbone area & SOB, per pt      History of Present Illness: Erika [name] is a 45 y.o. female who presents for a follow-up visit regarding Coronary artery disease and carotid disease.  She has been seen for refractory hypertension with no evidence of secondary hypertension. She presented in July with non-ST elevation myocardial infarction. She underwent cardiac catheterization which showed severe three-vessel coronary artery disease. She was transferred to [facility]. Preop carotid Doppler showed 80-99% right ICA stenosis. She underwent CABG and right carotid endarterectomy at the same time on July 14 without complications. She has been doing reasonably well since then with no recurrent chest tightness. She continues to have pain at the surgical incision. No significant shortness of breath. Surprisingly, her blood pressure has been reasonably controlled on carvedilol. She is also supposed to be on spironolactone 25 mg once daily but she did not fill that prescription. Unfortunately, she continues to smoke a few cigarettes a day.    Past Medical History:  Diagnosis Date  . Arthralgia of temporomandibular joint   . CAD, multiple vessel    a. cath 06/29/16: ostLM 40%, ostLAD 40%, pLAD 95%, ost-pLCx 60%, pLCx 95%, mLCx 60%, mRCA 95%, D2 50%, LVSF nl  . Depression   . Diastolic dysfunction    a.e cho 06/28/16: EF 50-55%, mild inf wall HK, GR1DD, mild MR, RV sys fxn nl, mildly dilated LA, PASP nl  . Fatty liver disease, nonalcoholic Q000111Q  . HLD (hyperlipidemia)   . Malignant hypertension   . Obesity   . PTSD (post-traumatic stress disorder)   . Tobacco abuse   . Type 2 diabetes mellitus [facility]) January 2017    Past Surgical History:  Procedure  Laterality Date  . CARDIAC CATHETERIZATION [name] 06/29/2016   Procedure: Left Heart Cath and Coronary Angiography;  Surgeon: Minna Merritts, MD;  Location: [place] [facility];  Service: Cardiovascular;  Laterality: [name];  . CESAREAN SECTION    . CHOLECYSTECTOMY    . CORONARY ARTERY BYPASS GRAFT [name] 07/06/2016   Procedure: CORONARY ARTERY BYPASS GRAFTING (CABG) x four, using left internal mammary artery and right leg greater saphenous vein harvested endoscopically;  Surgeon: Ivin Poot, MD;  Location: [place];  Service: Open Heart Surgery;  Laterality: [name];  . ENDARTERECTOMY Right 07/06/2016   Procedure: ENDARTERECTOMY CAROTID;  Surgeon: Rosetta Posner, MD;  Location: [place];  Service: Vascular;  Laterality: Right;  . PERIPHERAL VASCULAR CATHETERIZATION [name] 04/18/2016   Procedure: Renal Angiography;  Surgeon: Wellington Hampshire, MD;  Location: [place] [facility];  Service: Cardiovascular;  Laterality: [name];  . TEE WITHOUT CARDIOVERSION [name] 07/06/2016   Procedure: TRANSESOPHAGEAL ECHOCARDIOGRAM (TEE);  Surgeon: Ivin Poot, MD;  Location: [place];  Service: Open Heart Surgery;  Laterality: [name];  . TONSILLECTOMY       Current Outpatient Prescriptions  Medication Sig Dispense Refill  . aspirin EC 325 MG EC tablet Take 1 tablet (325 mg total) by mouth daily. 30 tablet 0  . atorvastatin (LIPITOR) 80 MG tablet Take 1 tablet (80 mg total) by mouth daily at 6 PM. 30 tablet 1  . carvedilol (COREG) 12.5 MG tablet Take 1 tablet (12.5 mg total) by mouth  2 (two) times daily. 60 tablet 1  . clonazePAM (KLONOPIN) 0.5 MG tablet Take 1 tablet (0.5 mg total) by mouth 3 (three) times daily. 90 tablet 2  . FLUoxetine (PROZAC) 40 MG capsule Take 1 capsule (40 mg total) by mouth daily. 30 capsule 2  . lamoTRIgine (LAMICTAL) 200 MG tablet Take 1 tablet (200 mg total) by mouth daily. 30 tablet 2  . metFORMIN (GLUCOPHAGE) 500 MG tablet Take 500 mg by mouth daily with breakfast.    . spironolactone (ALDACTONE) 25 MG tablet  Take 1 tablet (25 mg total) by mouth daily. 90 tablet 3   No current facility-administered medications for this visit.     Allergies:   No known allergies    Social History:  The patient  reports that she quit smoking about 6 weeks ago. Her smoking use included Cigarettes. She smoked 0.50 packs per day. She has never used smokeless tobacco. She reports that she does not drink alcohol or use drugs.   Family History:  The patient's family history includes Alcohol abuse in her father; Anxiety disorder in her sister; Diabetes in her father and mother; Drug abuse in her father; Heart disease in her father; Stroke in her sister. She was adopted.    ROS:  Please see the history of present illness.   Otherwise, review of systems are positive for none.   All other systems are reviewed and negative.    PHYSICAL EXAM: VS:  BP (!) 140/96 (BP Location: Right Arm, Patient Position: Sitting, Cuff Size: Normal)   Pulse 97   Ht 5\' 7"  (1.702 m)   Wt 203 lb 12.8 oz (92.4 kg)   SpO2 97%   BMI 31.92 kg/m  , BMI Body mass index is 31.92 kg/m. GEN: Well nourished, well developed, in no acute distress  HEENT: normal  Neck: no JVD, carotid bruits, or masses Cardiac: RRR; no  rubs, or gallops,no edema . There is 2 out of 6 systolic flow murmur in the pulmonic area Respiratory:  clear to auscultation bilaterally, normal work of breathing GI: soft, nontender, nondistended, + BS MS: no deformity or atrophy  Skin: warm and dry, no rash Neuro:  Strength and sensation are intact Psych: euthymic mood, full affect Right groin is intact with no hematoma   EKG:  EKG is not ordered today.    Recent Labs: 06/30/2016: B Natriuretic Peptide 120.7; TSH 1.485 07/05/2016: ALT 116 07/07/2016: Magnesium 2.2 07/11/2016: BUN 11; Creatinine, Ser 0.71; Potassium 3.8; Sodium 136 07/25/2016: Hemoglobin 11.0; Platelets 820.0    Lipid Panel    Component Value Date/Time   CHOL 218 (H) 06/28/2016 0544   TRIG 436 (H)  06/28/2016 0544   HDL 23 (L) 06/28/2016 0544   CHOLHDL 9.5 06/28/2016 0544   VLDL UNABLE TO CALCULATE IF TRIGLYCERIDE OVER 400 mg/dL 06/28/2016 0544   LDLCALC UNABLE TO CALCULATE IF TRIGLYCERIDE OVER 400 mg/dL 06/28/2016 0544   LDLDIRECT 177.1 04/22/2012 0809      Wt Readings from Last 3 Encounters:  08/14/16 203 lb 12.8 oz (92.4 kg)  08/07/16 206 lb (93.4 kg)  07/25/16 203 lb 4 oz (92.2 kg)         ASSESSMENT AND PLAN:  1.  Coronary artery disease involving native coronary arteries: Status post CABG for 3 vessel coronary artery disease recently in July. She is overall doing well with no recurrent anginal symptoms. She is going to start cardiac rehabilitation next month.  2. Carotid artery disease status post right carotid endarterectomy. Continue aggressive  treatment of risk factors.  3. Essential hypertension: Blood pressure is mildly elevated but she has not been taking her Aldactone which was refilled today. Check basic metabolic profile in one week.  4. Hyperlipidemia: Continue high dose atorvastatin. I am going to recheck her lipid profile and if triglyceride is still elevated, I will consider adding Lovaza.  5. Tobacco use: I again discussed with her the importance of complete smoking cessation. She reports having side effects in the past with Chantix and she wants to continue with nicotine patch.  Disposition:   FU with me in 2 months  Signed,  [name], MD  08/14/2016 2:13 PM    [place]

## 2016-08-15 ENCOUNTER — Ambulatory Visit
Admission: RE | Admit: 2016-08-15 | Discharge: 2016-08-15 | Disposition: A | Discharge status: Home / Self Care | Payer: BLUE CROSS/BLUE SHIELD | Source: Ambulatory Visit | Attending: Cardiothoracic Surgery | Admitting: Cardiothoracic Surgery

## 2016-08-15 ENCOUNTER — Ambulatory Visit (INDEPENDENT_AMBULATORY_CARE_PROVIDER_SITE_OTHER): Payer: Self-pay | Admitting: Cardiothoracic Surgery

## 2016-08-15 ENCOUNTER — Encounter: Payer: Self-pay | Admitting: Cardiothoracic Surgery

## 2016-08-15 VITALS — BP 137/100 | HR 98 | Resp 20 | Ht 67.0 in | Wt 203.0 lb

## 2016-08-15 DIAGNOSIS — Z951 Presence of aortocoronary bypass graft: Secondary | ICD-10-CM

## 2016-08-15 DIAGNOSIS — I214 Non-ST elevation (NSTEMI) myocardial infarction: Secondary | ICD-10-CM

## 2016-08-15 DIAGNOSIS — R0789 Other chest pain: Secondary | ICD-10-CM | POA: Diagnosis not present

## 2016-08-15 DIAGNOSIS — I2510 Atherosclerotic heart disease of native coronary artery without angina pectoris: Secondary | ICD-10-CM

## 2016-08-15 NOTE — Progress Notes (Signed)
PCP is Arnette Norris, MD Referring Provider is Minna Merritts, MD  Chief Complaint  Patient presents with  . Routine Post Op    f/u from surgery with CXR s/p CABG x4, 07/06/2016    HPI:The patient returns for one month follow-up after multivessel CABG after presenting with a subendocardial MI and unstable angina. She did well after surgery. She continues to do well at home. The patient denies angina. The patient denies symptoms of CHF. Surgical incisions are all healing well. She still has musculoskeletal pain in the chest and leg area of the incisions. The patient is scheduled to start cardiac rehabilitation. The patient had a simultaneous right carotid endarterectomy by Dr. early and has been evaluated in his office postop. She was evaluated by her cardiologist and her Lasix and iron tablets were stopped. She has preoperative history of severe hypertension and her blood pressure remains under good control on current meds The patient has history of smoking in the past that she has been totally abstinent from tobacco since surgery.   Past Medical History:  Diagnosis Date  . Arthralgia of temporomandibular joint   . CAD, multiple vessel    a. cath 06/29/16: ostLM 40%, ostLAD 40%, pLAD 95%, ost-pLCx 60%, pLCx 95%, mLCx 60%, mRCA 95%, D2 50%, LVSF nl  . Depression   . Diastolic dysfunction    a.e cho 06/28/16: EF 50-55%, mild inf wall HK, GR1DD, mild MR, RV sys fxn nl, mildly dilated LA, PASP nl  . Fatty liver disease, nonalcoholic Q000111Q  . HLD (hyperlipidemia)   . Malignant hypertension   . Obesity   . PTSD (post-traumatic stress disorder)   . Tobacco abuse   . Type 2 diabetes mellitus [facility]) January 2017    Past Surgical History:  Procedure Laterality Date  . CARDIAC CATHETERIZATION [name] 06/29/2016   Procedure: Left Heart Cath and Coronary Angiography;  Surgeon: Minna Merritts, MD;  Location: [place] [facility];  Service: Cardiovascular;  Laterality: [name];  . CESAREAN SECTION    .  CHOLECYSTECTOMY    . CORONARY ARTERY BYPASS GRAFT [name] 07/06/2016   Procedure: CORONARY ARTERY BYPASS GRAFTING (CABG) x four, using left internal mammary artery and right leg greater saphenous vein harvested endoscopically;  Surgeon: Ivin Poot, MD;  Location: [place];  Service: Open Heart Surgery;  Laterality: [name];  . ENDARTERECTOMY Right 07/06/2016   Procedure: ENDARTERECTOMY CAROTID;  Surgeon: Rosetta Posner, MD;  Location: [place];  Service: Vascular;  Laterality: Right;  . PERIPHERAL VASCULAR CATHETERIZATION [name] 04/18/2016   Procedure: Renal Angiography;  Surgeon: Wellington Hampshire, MD;  Location: [place] [facility];  Service: Cardiovascular;  Laterality: [name];  . TEE WITHOUT CARDIOVERSION [name] 07/06/2016   Procedure: TRANSESOPHAGEAL ECHOCARDIOGRAM (TEE);  Surgeon: Ivin Poot, MD;  Location: [place];  Service: Open Heart Surgery;  Laterality: [name];  . TONSILLECTOMY      Family History  Problem Relation Age of Onset  . Adopted: Yes  . Diabetes Mother   . Diabetes Father   . Alcohol abuse Father   . Heart disease Father   . Drug abuse Father   . Stroke Sister   . Anxiety disorder Sister     Social History Social History  Substance Use Topics  . Smoking status: Former Smoker    Packs/day: 0.50    Types: Cigarettes    Quit date: 06/28/2016  . Smokeless tobacco: Never Used  . Alcohol use No    Current Outpatient Prescriptions  Medication Sig Dispense  Refill  . aspirin EC 325 MG EC tablet Take 1 tablet (325 mg total) by mouth daily. 30 tablet 0  . atorvastatin (LIPITOR) 80 MG tablet Take 1 tablet (80 mg total) by mouth daily at 6 PM. 30 tablet 1  . carvedilol (COREG) 12.5 MG tablet Take 1 tablet (12.5 mg total) by mouth 2 (two) times daily. 60 tablet 1  . clonazePAM (KLONOPIN) 0.5 MG tablet Take 1 tablet (0.5 mg total) by mouth 3 (three) times daily. 90 tablet 2  . FLUoxetine (PROZAC) 40 MG capsule Take 1 capsule (40 mg total) by mouth daily. 30 capsule 2  . lamoTRIgine (LAMICTAL) 200  MG tablet Take 1 tablet (200 mg total) by mouth daily. 30 tablet 2  . metFORMIN (GLUCOPHAGE) 500 MG tablet Take 500 mg by mouth daily with breakfast.    . spironolactone (ALDACTONE) 25 MG tablet Take 1 tablet (25 mg total) by mouth daily. 30 tablet 3   No current facility-administered medications for this visit.     Allergies  Allergen Reactions  . No Known Allergies     Review of Systems  Improving appetite Improving strength Incisions without drainage, no fever  BP (!) 137/100 (BP Location: Left Arm, Patient Position: Sitting, Cuff Size: Normal)   Pulse 98   Resp 20   Ht 5\' 7"  (1.702 m)   Wt 203 lb (92.1 kg)   SpO2 98% Comment: RA  BMI 31.79 kg/m  Physical Exam      Exam    General- alert and comfortable   Lungs- clear without rales, wheezes   Cor- regular rate and rhythm, no murmur , gallop   Abdomen- soft, non-tender   Extremities - warm, non-tender, minimal edema   Neuro- oriented, appropriate, no focal weakness   Diagnostic Tests: Chest x-ray clear  Impression: Excellent early recovery after multivessel CABG We discussed measures this young woman can take to prevent recurrent CAD in the future.  Plan: Patient can resume driving in light activities. The patient understands she should not lift more than 20 pounds until 3 months after surgery. The patient will continue aspirin and her statin indefinitely. The patient return to this office as needed for postop surgical issues.   Len Childs, MD [facility] [phone]

## 2016-08-21 ENCOUNTER — Other Ambulatory Visit (INDEPENDENT_AMBULATORY_CARE_PROVIDER_SITE_OTHER): Payer: BLUE CROSS/BLUE SHIELD

## 2016-08-21 ENCOUNTER — Other Ambulatory Visit: Payer: Self-pay | Admitting: *Deleted

## 2016-08-21 DIAGNOSIS — I25110 Atherosclerotic heart disease of native coronary artery with unstable angina pectoris: Secondary | ICD-10-CM

## 2016-08-21 DIAGNOSIS — I6523 Occlusion and stenosis of bilateral carotid arteries: Secondary | ICD-10-CM

## 2016-08-22 LAB — LIPID PANEL
Chol/HDL Ratio: 5 ratio units — ABNORMAL HIGH (ref 0.0–4.4)
Cholesterol, Total: 121 mg/dL (ref 100–199)
HDL: 24 mg/dL — AB (ref >39)
LDL CALC: 64 mg/dL (ref 0–99)
Triglycerides: 164 mg/dL — ABNORMAL HIGH (ref 0–149)
VLDL CHOLESTEROL CAL: 33 mg/dL (ref 5–40)

## 2016-08-22 LAB — BASIC METABOLIC PANEL
BUN/Creatinine Ratio: 12 (ref 9–23)
BUN: 8 mg/dL (ref 6–24)
CO2: 22 mmol/L (ref 18–29)
CREATININE: 0.67 mg/dL (ref 0.57–1.00)
Calcium: 10.2 mg/dL (ref 8.7–10.2)
Chloride: 100 mmol/L (ref 96–106)
GFR calc Af Amer: 125 mL/min/{1.73_m2} (ref >59)
GFR calc non Af Amer: 109 mL/min/{1.73_m2} (ref >59)
GLUCOSE: 122 mg/dL — AB (ref 65–99)
POTASSIUM: 4.5 mmol/L (ref 3.5–5.2)
SODIUM: 142 mmol/L (ref 134–144)

## 2016-08-22 LAB — HEPATIC FUNCTION PANEL
ALK PHOS: 130 IU/L — AB (ref 39–117)
ALT: 41 IU/L — ABNORMAL HIGH (ref 0–32)
AST: 23 IU/L (ref 0–40)
Albumin: 4.3 g/dL (ref 3.5–5.5)
Bilirubin Total: 0.3 mg/dL (ref 0.0–1.2)
Bilirubin, Direct: 0.11 mg/dL (ref 0.00–0.40)
TOTAL PROTEIN: 7.1 g/dL (ref 6.0–8.5)

## 2016-08-28 ENCOUNTER — Encounter (HOSPITAL_COMMUNITY): Payer: Self-pay | Admitting: Emergency Medicine

## 2016-08-28 ENCOUNTER — Emergency Department (HOSPITAL_COMMUNITY): Payer: BLUE CROSS/BLUE SHIELD

## 2016-08-28 ENCOUNTER — Inpatient Hospital Stay (HOSPITAL_COMMUNITY)
Admission: EM | Admit: 2016-08-28 | Discharge: 2016-08-30 | DRG: 282 | Disposition: A | Discharge status: Home / Self Care | Payer: BLUE CROSS/BLUE SHIELD | Attending: Internal Medicine | Admitting: Internal Medicine

## 2016-08-28 DIAGNOSIS — E785 Hyperlipidemia, unspecified: Secondary | ICD-10-CM | POA: Diagnosis not present

## 2016-08-28 DIAGNOSIS — I119 Hypertensive heart disease without heart failure: Secondary | ICD-10-CM

## 2016-08-28 DIAGNOSIS — I6529 Occlusion and stenosis of unspecified carotid artery: Secondary | ICD-10-CM | POA: Diagnosis present

## 2016-08-28 DIAGNOSIS — E669 Obesity, unspecified: Secondary | ICD-10-CM | POA: Diagnosis not present

## 2016-08-28 DIAGNOSIS — Z8249 Family history of ischemic heart disease and other diseases of the circulatory system: Secondary | ICD-10-CM | POA: Diagnosis not present

## 2016-08-28 DIAGNOSIS — Z951 Presence of aortocoronary bypass graft: Secondary | ICD-10-CM | POA: Diagnosis not present

## 2016-08-28 DIAGNOSIS — R0789 Other chest pain: Secondary | ICD-10-CM | POA: Diagnosis not present

## 2016-08-28 DIAGNOSIS — R079 Chest pain, unspecified: Secondary | ICD-10-CM

## 2016-08-28 DIAGNOSIS — Z79899 Other long term (current) drug therapy: Secondary | ICD-10-CM | POA: Diagnosis not present

## 2016-08-28 DIAGNOSIS — Z87891 Personal history of nicotine dependence: Secondary | ICD-10-CM

## 2016-08-28 DIAGNOSIS — F329 Major depressive disorder, single episode, unspecified: Secondary | ICD-10-CM | POA: Diagnosis present

## 2016-08-28 DIAGNOSIS — I160 Hypertensive urgency: Secondary | ICD-10-CM | POA: Diagnosis not present

## 2016-08-28 DIAGNOSIS — Z7982 Long term (current) use of aspirin: Secondary | ICD-10-CM | POA: Diagnosis not present

## 2016-08-28 DIAGNOSIS — E1169 Type 2 diabetes mellitus with other specified complication: Secondary | ICD-10-CM

## 2016-08-28 DIAGNOSIS — E119 Type 2 diabetes mellitus without complications: Secondary | ICD-10-CM | POA: Diagnosis present

## 2016-08-28 DIAGNOSIS — F4310 Post-traumatic stress disorder, unspecified: Secondary | ICD-10-CM | POA: Diagnosis not present

## 2016-08-28 DIAGNOSIS — E0859 Diabetes mellitus due to underlying condition with other circulatory complications: Secondary | ICD-10-CM

## 2016-08-28 DIAGNOSIS — I214 Non-ST elevation (NSTEMI) myocardial infarction: Principal | ICD-10-CM | POA: Diagnosis present

## 2016-08-28 DIAGNOSIS — R072 Precordial pain: Secondary | ICD-10-CM | POA: Diagnosis not present

## 2016-08-28 DIAGNOSIS — I2510 Atherosclerotic heart disease of native coronary artery without angina pectoris: Secondary | ICD-10-CM | POA: Diagnosis present

## 2016-08-28 DIAGNOSIS — Z818 Family history of other mental and behavioral disorders: Secondary | ICD-10-CM | POA: Diagnosis not present

## 2016-08-28 DIAGNOSIS — Z7984 Long term (current) use of oral hypoglycemic drugs: Secondary | ICD-10-CM | POA: Diagnosis not present

## 2016-08-28 DIAGNOSIS — R21 Rash and other nonspecific skin eruption: Secondary | ICD-10-CM | POA: Diagnosis present

## 2016-08-28 DIAGNOSIS — Z833 Family history of diabetes mellitus: Secondary | ICD-10-CM

## 2016-08-28 DIAGNOSIS — I252 Old myocardial infarction: Secondary | ICD-10-CM | POA: Diagnosis not present

## 2016-08-28 DIAGNOSIS — I213 ST elevation (STEMI) myocardial infarction of unspecified site: Secondary | ICD-10-CM | POA: Diagnosis not present

## 2016-08-28 DIAGNOSIS — K760 Fatty (change of) liver, not elsewhere classified: Secondary | ICD-10-CM | POA: Diagnosis not present

## 2016-08-28 DIAGNOSIS — Z823 Family history of stroke: Secondary | ICD-10-CM | POA: Diagnosis not present

## 2016-08-28 DIAGNOSIS — I10 Essential (primary) hypertension: Secondary | ICD-10-CM | POA: Diagnosis not present

## 2016-08-28 DIAGNOSIS — I6523 Occlusion and stenosis of bilateral carotid arteries: Secondary | ICD-10-CM | POA: Diagnosis not present

## 2016-08-28 DIAGNOSIS — Z6831 Body mass index (BMI) 31.0-31.9, adult: Secondary | ICD-10-CM | POA: Diagnosis not present

## 2016-08-28 DIAGNOSIS — Z811 Family history of alcohol abuse and dependence: Secondary | ICD-10-CM | POA: Diagnosis not present

## 2016-08-28 HISTORY — DX: Disorder of arteries and arterioles, unspecified: I77.9

## 2016-08-28 HISTORY — DX: Peripheral vascular disease, unspecified: I73.9

## 2016-08-28 LAB — CBC
HCT: 36.7 % (ref 36.0–46.0)
HEMOGLOBIN: 11.5 g/dL — AB (ref 12.0–15.0)
MCH: 26.1 pg (ref 26.0–34.0)
MCHC: 31.3 g/dL (ref 30.0–36.0)
MCV: 83.4 fL (ref 78.0–100.0)
PLATELETS: 450 10*3/uL — AB (ref 150–400)
RBC: 4.4 MIL/uL (ref 3.87–5.11)
RDW: 14.2 % (ref 11.5–15.5)
WBC: 12 10*3/uL — ABNORMAL HIGH (ref 4.0–10.5)

## 2016-08-28 LAB — I-STAT TROPONIN, ED: TROPONIN I, POC: 0.04 ng/mL (ref 0.00–0.08)

## 2016-08-28 LAB — BASIC METABOLIC PANEL
ANION GAP: 7 (ref 5–15)
BUN: 8 mg/dL (ref 6–20)
CALCIUM: 10.1 mg/dL (ref 8.9–10.3)
CO2: 27 mmol/L (ref 22–32)
CREATININE: 0.68 mg/dL (ref 0.44–1.00)
Chloride: 105 mmol/L (ref 101–111)
GFR calc Af Amer: >60 mL/min (ref >60)
GLUCOSE: 127 mg/dL — AB (ref 65–99)
Potassium: 3.6 mmol/L (ref 3.5–5.1)
Sodium: 139 mmol/L (ref 135–145)

## 2016-08-28 LAB — TROPONIN I: TROPONIN I: 0.09 ng/mL — AB (ref <0.03)

## 2016-08-28 MED ORDER — ENOXAPARIN SODIUM 40 MG/0.4ML ~~LOC~~ SOLN: 40.0000 mg | Freq: Every day | SUBCUTANEOUS | Status: DC

## 2016-08-28 MED ORDER — ACETAMINOPHEN 325 MG PO TABS: 650.0000 mg | ORAL_TABLET | ORAL | Status: DC | PRN

## 2016-08-28 MED ORDER — FLUOXETINE HCL 20 MG PO CAPS
40.0000 mg | ORAL_CAPSULE | Freq: Every day | ORAL | Status: DC
  Administered 2016-08-29 – 2016-08-30 (×2): 40 mg via ORAL
  Filled 2016-08-28 (×2): qty 2

## 2016-08-28 MED ORDER — MORPHINE SULFATE (PF) 2 MG/ML IV SOLN
2.0000 mg | INTRAVENOUS | Status: DC | PRN
  Administered 2016-08-29 (×4): 2 mg via INTRAVENOUS
  Filled 2016-08-28 (×4): qty 1

## 2016-08-28 MED ORDER — CARVEDILOL 12.5 MG PO TABS
12.5000 mg | ORAL_TABLET | Freq: Two times a day (BID) | ORAL | Status: DC
  Administered 2016-08-29: 12.5 mg via ORAL
  Filled 2016-08-28: qty 1

## 2016-08-28 MED ORDER — ASPIRIN EC 325 MG PO TBEC
325.0000 mg | DELAYED_RELEASE_TABLET | Freq: Every day | ORAL | Status: DC
  Administered 2016-08-29 – 2016-08-30 (×2): 325 mg via ORAL
  Filled 2016-08-28 (×2): qty 1

## 2016-08-28 MED ORDER — ONDANSETRON HCL 4 MG/2ML IJ SOLN: 4.0000 mg | Freq: Four times a day (QID) | INTRAMUSCULAR | Status: DC | PRN

## 2016-08-28 MED ORDER — ATORVASTATIN CALCIUM 80 MG PO TABS
80.0000 mg | ORAL_TABLET | Freq: Every day | ORAL | Status: DC
  Administered 2016-08-29: 80 mg via ORAL
  Filled 2016-08-28: qty 1

## 2016-08-28 MED ORDER — LAMOTRIGINE 100 MG PO TABS
200.0000 mg | ORAL_TABLET | Freq: Every day | ORAL | Status: DC
  Administered 2016-08-29 – 2016-08-30 (×2): 200 mg via ORAL
  Filled 2016-08-28 (×2): qty 2

## 2016-08-28 MED ORDER — SPIRONOLACTONE 25 MG PO TABS
25.0000 mg | ORAL_TABLET | Freq: Every day | ORAL | Status: DC
  Administered 2016-08-29 (×2): 25 mg via ORAL
  Filled 2016-08-28 (×2): qty 1

## 2016-08-28 MED ORDER — HYDROCODONE-ACETAMINOPHEN 5-325 MG PO TABS
1.0000 | ORAL_TABLET | ORAL | Status: DC | PRN
  Administered 2016-08-29 – 2016-08-30 (×4): 1 via ORAL
  Filled 2016-08-28 (×4): qty 1

## 2016-08-28 MED ORDER — NITROGLYCERIN 2 % TD OINT
1.0000 [in_us] | TOPICAL_OINTMENT | Freq: Once | TRANSDERMAL | Status: AC
  Administered 2016-08-28: 1 [in_us] via TOPICAL
  Filled 2016-08-28: qty 1

## 2016-08-28 MED ORDER — CLONAZEPAM 0.5 MG PO TABS
0.5000 mg | ORAL_TABLET | Freq: Three times a day (TID) | ORAL | Status: DC
  Administered 2016-08-29 – 2016-08-30 (×4): 0.5 mg via ORAL
  Filled 2016-08-28 (×4): qty 1

## 2016-08-28 NOTE — ED Notes (Signed)
Pt c/o pain in back between shoulder blades

## 2016-08-28 NOTE — ED Provider Notes (Signed)
[name] DEPT Provider Note   CSN: [id number] Arrival date & time: 08/28/16  1944     History   Chief Complaint Chief Complaint  Patient presents with  . Chest Pain    HPI Erika [name] is a 45 y.o. female.  The history is provided by the patient.  Chest Pain   This is a new problem. The current episode started yesterday. The problem occurs daily (a few seconds yesterday and 30 minutes today). The problem has been gradually improving. The pain is associated with rest. The pain is present in the substernal region. The pain is moderate. The quality of the pain is described as pressure-like, sharp and burning. The pain radiates to the left arm and left neck. Associated symptoms include diaphoresis, dizziness, nausea, shortness of breath (with the episode) and vomiting. Pertinent negatives include no abdominal pain, no exertional chest pressure, no fever, no hemoptysis and no lower extremity edema. She has tried nitroglycerin for the symptoms. The treatment provided significant relief.  Her past medical history is significant for CAD (s/p CABG).    Past Medical History:  Diagnosis Date  . Arthralgia of temporomandibular joint   . CAD, multiple vessel    a. cath 06/29/16: ostLM 40%, ostLAD 40%, pLAD 95%, ost-pLCx 60%, pLCx 95%, mLCx 60%, mRCA 95%, D2 50%, LVSF nl  . Depression   . Diastolic dysfunction    a.e cho 06/28/16: EF 50-55%, mild inf wall HK, GR1DD, mild MR, RV sys fxn nl, mildly dilated LA, PASP nl  . Fatty liver disease, nonalcoholic Q000111Q  . HLD (hyperlipidemia)   . Malignant hypertension   . Obesity   . PTSD (post-traumatic stress disorder)   . Tobacco abuse   . Type 2 diabetes mellitus [facility]) January 2017    Patient Active Problem List   Diagnosis Date Noted  . Acute blood loss anemia 07/25/2016  . Constipation 07/25/2016  . S/P CABG x 4 07/06/2016  . Bradycardia   . CAD (coronary artery disease)   . Carotid stenosis   . CAD in native artery 06/29/2016    . Metabolic syndrome [id number]  . Elevated troponin 06/28/2016  . Essential hypertension, malignant 06/28/2016  . Tobacco abuse 06/28/2016  . Essential hypertension   . Malignant hypertension   . Diabetes ([place])   . NSTEMI (non-ST elevated myocardial infarction) ([place]) 06/27/2016  . Proteinuria 03/15/2016  . Renal artery stenosis ([place]) 03/15/2016  . Neck pain on left side 03/15/2016  . Dizziness and giddiness 12/08/2015  . MDD (major depressive disorder) ([place]) 10/17/2015  . Agoraphobia with panic attacks 04/25/2015  . HTN (hypertension), malignant 10/20/2013  . Cluster headache 03/20/2012  . HLD (hyperlipidemia) 07/26/2010  . ADJUSTMENT DISORDER WITH MIXED FEATURES 07/26/2010    Past Surgical History:  Procedure Laterality Date  . CARDIAC CATHETERIZATION [name] 06/29/2016   Procedure: Left Heart Cath and Coronary Angiography;  Surgeon: Minna Merritts, MD;  Location: [place] [facility];  Service: Cardiovascular;  Laterality: [name];  . CESAREAN SECTION    . CHOLECYSTECTOMY    . CORONARY ARTERY BYPASS GRAFT [name] 07/06/2016   Procedure: CORONARY ARTERY BYPASS GRAFTING (CABG) x four, using left internal mammary artery and right leg greater saphenous vein harvested endoscopically;  Surgeon: Ivin Poot, MD;  Location: [place];  Service: Open Heart Surgery;  Laterality: [name];  . ENDARTERECTOMY Right 07/06/2016   Procedure: ENDARTERECTOMY CAROTID;  Surgeon: Rosetta Posner, MD;  Location: [place];  Service: Vascular;  Laterality: Right;  . PERIPHERAL VASCULAR CATHETERIZATION  [name] 04/18/2016   Procedure: Renal Angiography;  Surgeon: Wellington Hampshire, MD;  Location: [place] [facility];  Service: Cardiovascular;  Laterality: [name];  . TEE WITHOUT CARDIOVERSION [name] 07/06/2016   Procedure: TRANSESOPHAGEAL ECHOCARDIOGRAM (TEE);  Surgeon: Ivin Poot, MD;  Location: [place];  Service: Open Heart Surgery;  Laterality: [name];  . TONSILLECTOMY      OB History    No data available       Home Medications     Prior to Admission medications   Medication Sig Start Date End Date Taking? Authorizing Provider  aspirin EC 325 MG EC tablet Take 1 tablet (325 mg total) by mouth daily. 07/13/16  Yes Donielle Liston Alba, PA-C  atorvastatin (LIPITOR) 80 MG tablet Take 1 tablet (80 mg total) by mouth daily at 6 PM. 07/13/16  Yes Donielle Liston Alba, PA-C  carvedilol (COREG) 12.5 MG tablet Take 1 tablet (12.5 mg total) by mouth 2 (two) times daily. 07/13/16  Yes Donielle Liston Alba, PA-C  clonazePAM (KLONOPIN) 0.5 MG tablet Take 1 tablet (0.5 mg total) by mouth 3 (three) times daily. 06/19/16 06/19/17 Yes Kathlee Nations, MD  FLUoxetine (PROZAC) 40 MG capsule Take 1 capsule (40 mg total) by mouth daily. 06/19/16  Yes Kathlee Nations, MD  HYDROcodone-acetaminophen (NORCO/VICODIN) 5-325 MG tablet Take 1 tablet by mouth every 4 (four) hours as needed (for cluster headaches).  06/27/16  Yes Historical Provider, MD  lamoTRIgine (LAMICTAL) 200 MG tablet Take 1 tablet (200 mg total) by mouth daily. 06/19/16  Yes Kathlee Nations, MD  metFORMIN (GLUCOPHAGE) 500 MG tablet Take 500 mg by mouth daily with breakfast.   Yes Historical Provider, MD  spironolactone (ALDACTONE) 25 MG tablet Take 1 tablet (25 mg total) by mouth daily. Patient taking differently: Take 25 mg by mouth at bedtime.  08/14/16  Yes Wellington Hampshire, MD    Family History Family History  Problem Relation Age of Onset  . Adopted: Yes  . Diabetes Mother   . Diabetes Father   . Alcohol abuse Father   . Heart disease Father   . Drug abuse Father   . Stroke Sister   . Anxiety disorder Sister     Social History Social History  Substance Use Topics  . Smoking status: Former Smoker    Packs/day: 0.50    Types: Cigarettes    Quit date: 06/28/2016  . Smokeless tobacco: Never Used  . Alcohol use No     Allergies   No known allergies   Review of Systems Review of Systems  Constitutional: Positive for diaphoresis. Negative for fever.  Respiratory:  Positive for shortness of breath (with the episode). Negative for hemoptysis.   Cardiovascular: Positive for chest pain.  Gastrointestinal: Positive for nausea and vomiting. Negative for abdominal pain.  Neurological: Positive for dizziness.  All other systems reviewed and are negative.    Physical Exam Updated Vital Signs BP 173/96 (BP Location: Left Arm)   Pulse 95   Temp 98.2 F (36.8 C) (Oral)   Resp 24   Ht 5\' 7"  (1.702 m)   Wt 203 lb (92.1 kg)   LMP 08/21/2016   SpO2 96%   BMI 31.79 kg/m   Physical Exam  Constitutional: She is oriented to person, place, and time. She appears well-developed and well-nourished. No distress.  HENT:  Head: Normocephalic.  Eyes: Conjunctivae are normal.  Neck: Neck supple. No tracheal deviation present.  Cardiovascular: Normal rate, regular rhythm and normal heart sounds.  No murmur heard. Pulmonary/Chest: Effort normal and breath sounds normal. No respiratory distress. She has no wheezes. She has no rales. She exhibits tenderness (sternal over well healing midline incisi).  Abdominal: Soft. She exhibits no distension. There is no tenderness.  Neurological: She is alert and oriented to person, place, and time.  Skin: Skin is warm and dry.  Psychiatric: She has a normal mood and affect.     ED Treatments / Results  Labs (all labs ordered are listed, but only abnormal results are displayed) Labs Reviewed  BASIC METABOLIC PANEL - Abnormal; Notable for the following:       Result Value   Glucose, Bld 127 (*)    All other components within normal limits  CBC - Abnormal; Notable for the following:    WBC 12.0 (*)    Hemoglobin 11.5 (*)    Platelets 450 (*)    All other components within normal limits  TROPONIN I  TROPONIN I  TROPONIN I  I-STAT TROPOININ, ED    EKG  EKG Interpretation  Date/Time:  Tuesday August 28 2016 20:02:07 EDT Ventricular Rate:  95 PR Interval:    QRS Duration: 102 QT Interval:  388 QTC  Calculation: 488 R Axis:   56 Text Interpretation:  Sinus rhythm Nonspecific T wave abnormality, worse in Anterior leads Lateral leads are also involved Confirmed by Zoa Dowty MD, Ladarian Bonczek [phone]) on 08/28/2016 8:07:40 PM       Radiology Dg Chest 2 View  Result Date: 08/28/2016 CLINICAL DATA:  Left chest pain and left jaw pain with nausea and vomiting for 2 days. Bypass surgery 1 month ago. EXAM: CHEST  2 VIEW COMPARISON:  08/15/2016 FINDINGS: Mild enlargement of the cardiopericardial silhouette, without edema. Prior CABG. No pleural effusion or airspace opacity. No additional significant findings. IMPRESSION: 1. Stable mild enlargement of the cardiopericardial silhouette, with prior CABG. No edema or other acute findings. Electronically Signed   By: Van Clines M.D.   On: 08/28/2016 21:46    Procedures Procedures (including critical care time)  Medications Ordered in ED Medications  nitroGLYCERIN (NITROGLYN) 2 % ointment 1 inch (not administered)     Initial Impression / Assessment and Plan / ED Course  I have reviewed the triage vital signs and the nursing notes.  Pertinent labs & imaging results that were available during my care of the patient were reviewed by me and considered in my medical decision making (see chart for details).  Clinical Course    8:25 PM Discussed EKG with Dr. Raiford Simmonds of cardiology who agrees this does not represent a true STEMI and elevation is likely a repolarization abnormality status post CABG.  45 year old female presents with chest pain that occurred acutely tonight that was described as severe, substernal, radiating to the left neck and left arm and associated with diaphoresis and shortness of breath. She states that on EMS arrival her blood pressure was severely elevated to 240/120 or above on multiple readings. She was given aspirin and nitroglycerin which resolved her pain almost immediately. She is high risk as she is less than 2 months status post 4  vessel CABG. She has been compliant with all medications. Nitro paste applied to prevent rebound pain. EKG with nonspecific repolarization abnormalities that are concerning no significant ST elevation or depression.  First troponin is negative, will require admission for ACS rule out as she is high risk and may need an anatomic evaluation if enzymes turn positive. It is possible she had a hypertensive crisis but is  not profoundly hypertensive here compared to her baseline. Hospitalist was consulted for admission and will see the patient in the emergency department. Requesting cardiology consultation, cardiology Dr Raiford Simmonds came to see Pt in ED for evaluation.   Final Clinical Impressions(s) / ED Diagnoses   Final diagnoses:  Chest pain with high risk for cardiac etiology    New Prescriptions New Prescriptions   No medications on file     Leo Grosser, MD 08/28/16 2319

## 2016-08-28 NOTE — Consult Note (Signed)
Cardiology Consult    Patient ID: Erika [name] MRN: [id number], DOB/AGE: June 30, 1971   Admit date: 08/28/2016 Date of Consult: 08/28/2016  Primary Physician: Erika Norris, MD Primary Cardiologist: Erika [name] Requesting Provider: Dr. Alcario [name]   Patient Profile    45 y o woman with CP and history fo recent CABG  Past Medical History   Past Medical History:  Diagnosis Date  . Arthralgia of temporomandibular joint   . CAD, multiple vessel    a. cath 06/29/16: ostLM 40%, ostLAD 40%, pLAD 95%, ost-pLCx 60%, pLCx 95%, mLCx 60%, mRCA 95%, D2 50%, LVSF nl  . Depression   . Diastolic dysfunction    a.e cho 06/28/16: EF 50-55%, mild inf wall HK, GR1DD, mild MR, RV sys fxn nl, mildly dilated LA, PASP nl  . Fatty liver disease, nonalcoholic Q000111Q  . HLD (hyperlipidemia)   . Malignant hypertension   . Obesity   . PTSD (post-traumatic stress disorder)   . Tobacco abuse   . Type 2 diabetes mellitus [facility]) January 2017    Past Surgical History:  Procedure Laterality Date  . CARDIAC CATHETERIZATION [name] 06/29/2016   Procedure: Left Heart Cath and Coronary Angiography;  Surgeon: Erika Merritts, MD;  Location: [place] CV LAB;  Service: Cardiovascular;  Laterality: [name];  . CESAREAN SECTION    . CHOLECYSTECTOMY    . CORONARY ARTERY BYPASS GRAFT [name] 07/06/2016   Procedure: CORONARY ARTERY BYPASS GRAFTING (CABG) x four, using left internal mammary artery and right leg greater saphenous vein harvested endoscopically;  Surgeon: Erika Poot, MD;  Location: [place];  Service: Open Heart Surgery;  Laterality: [name];  . ENDARTERECTOMY Right 07/06/2016   Procedure: ENDARTERECTOMY CAROTID;  Surgeon: Erika Posner, MD;  Location: [place];  Service: Vascular;  Laterality: Right;  . PERIPHERAL VASCULAR CATHETERIZATION [name] 04/18/2016   Procedure: Renal Angiography;  Surgeon: Erika Hampshire, MD;  Location: [place] CV LAB;  Service: Cardiovascular;  Laterality: [name];  . TEE WITHOUT CARDIOVERSION [name] 07/06/2016   Procedure: TRANSESOPHAGEAL ECHOCARDIOGRAM (TEE);  Surgeon: Erika Poot, MD;  Location: [place];  Service: Open Heart Surgery;  Laterality: [name];  . TONSILLECTOMY       Allergies  Allergies  Allergen Reactions  . No Known Allergies     History of Present Illness  [name] has a PMH of CAD s/p 4 v CABG in 06/2016, HTN, DM, smoking who presents with new onset CP starting last night. She underwent coronary artery bypass grafting x4 (left internal mammary artery to  left anterior descending coronary artery, saphenous vein graft to diagonal, saphenous vein graft to circumflex marginal, saphenous  vein graft to right coronary artery posterior descending) in 06/2016. Also with CEA on the right side. Post op course was uncomplicated. She has been followed up several times since then with good BP control as outpt.   Then last night she noted CP at rest while in bed. Retrosternal, non radiating. Lasted 30 min. Then this afternoon she had again the same pain just more intense. She had repeat episodes at rest, None with activity. She denies SOB, PND, orthopnea or LEE. She does not exercise.  No new meds started recently, no missed meds. She has not taken her BP at home over last few weeks. When she took it this eveninig following a CP episode it was 260 syst. EMS got the same number.   CP now.   She still smokes, She denies illicit drug use or ethanol use.  Inpatient Medications    . [START ON 08/29/2016] aspirin  325 mg Oral Daily  . [START ON 08/29/2016] atorvastatin  80 mg Oral q1800  . [START ON 08/29/2016] carvedilol  12.5 mg Oral BID WC  . clonazePAM  0.5 mg Oral TID  . [START ON 08/29/2016] enoxaparin (LOVENOX) injection  40 mg Subcutaneous Daily  . [START ON 08/29/2016] FLUoxetine  40 mg Oral Daily  . [START ON 08/29/2016] lamoTRIgine  200 mg Oral Daily  . spironolactone  25 mg Oral QHS    Family History    Family History  Problem Relation Age of Onset  . Adopted: Yes  . Diabetes Mother     . Diabetes Father   . Alcohol abuse Father   . Heart disease Father   . Drug abuse Father   . Stroke Sister   . Anxiety disorder Sister     Social History    Social History   Social History  . Marital status: Married    Spouse name: [name]  . Number of children: [name]  . Years of education: [name]   Occupational History  . Not on file.   Social History Main Topics  . Smoking status: Former Smoker    Packs/day: 0.50    Types: Cigarettes    Quit date: 06/28/2016  . Smokeless tobacco: Never Used  . Alcohol use No  . Drug use: No  . Sexual activity: Yes    Birth control/ protection: None   Other Topics Concern  . Not on file   Social History Narrative  . No narrative on file     Review of Systems    General:  No chills, fever, night sweats or weight changes.  Cardiovascular:  No chest pain, dyspnea on exertion, edema, orthopnea, palpitations, paroxysmal nocturnal dyspnea. Dermatological: No rash, lesions/masses Respiratory: No cough, dyspnea Urologic: No hematuria, dysuria Abdominal:   No nausea, vomiting, diarrhea, bright red blood per rectum, melena, or hematemesis Neurologic:  No visual changes, wkns, changes in mental status. All other systems reviewed and are otherwise negative except as noted above.  Physical Exam    Blood pressure 173/96, pulse 95, temperature 98.2 F (36.8 C), temperature source Oral, resp. rate 24, height 5\' 7"  (1.702 m), weight 92.1 kg (203 lb), last menstrual period 08/21/2016, SpO2 96 %.  General: Pleasant, NAD Psych: Normal affect. Neuro: Alert and oriented X 3. Moves all extremities spontaneously. HEENT: Normal  Neck: Supple without bruits or JVD. Lungs:  Resp regular and unlabored, CTA. Heart: RRR no s3, s4, or murmurs. Abdomen: Soft, non-tender, non-distended, BS + x 4.  Extremities: No clubbing, cyanosis or edema. DP/PT/Radials 2+ and equal bilaterally.  Labs    Troponin [facility] Test)  Recent Labs  08/28/16 2049   TROPIPOC 0.04   No results for input(s): CKTOTAL, CKMB, TROPONINI in the last 72 hours. Lab Results  Component Value Date   WBC 12.0 (H) 08/28/2016   HGB 11.5 (L) 08/28/2016   HCT 36.7 08/28/2016   MCV 83.4 08/28/2016   PLT 450 (H) 08/28/2016    Recent Labs Lab 08/28/16 2031  NA 139  K 3.6  CL 105  CO2 27  BUN 8  CREATININE 0.68  CALCIUM 10.1  GLUCOSE 127*   Lab Results  Component Value Date   CHOL 121 08/21/2016   HDL 24 (L) 08/21/2016   LDLCALC 64 08/21/2016   TRIG 164 (H) 08/21/2016   Lab Results  Component Value Date   DDIMER 0.42 06/27/2016  Radiology Studies    Dg Chest 2 View  Result Date: 08/28/2016 CLINICAL DATA:  Left chest pain and left jaw pain with nausea and vomiting for 2 days. Bypass surgery 1 month ago. EXAM: CHEST  2 VIEW COMPARISON:  08/15/2016 FINDINGS: Mild enlargement of the cardiopericardial silhouette, without edema. Prior CABG. No pleural effusion or airspace opacity. No additional significant findings. IMPRESSION: 1. Stable mild enlargement of the cardiopericardial silhouette, with prior CABG. No edema or other acute findings. Electronically Signed   By: Van Clines M.D.   On: 08/28/2016 21:46   Dg Chest 2 View  Result Date: 08/15/2016 CLINICAL DATA:  Status post CABG on July 14 27. The patient reports chest tenderness but no other chest complaints. EXAM: CHEST  2 VIEW COMPARISON:  PA and lateral chest x-ray of July 09, 2016 FINDINGS: The lungs are adequately inflated. The pleural effusions have resolved. Bibasilar atelectasis has also nearly totally cleared. The heart is mildly enlarged. The pulmonary vascularity is normal. The sternal wires are intact. No acute abnormality of the bony thorax is observed. IMPRESSION: Near total clearing of bibasilar atelectasis. Interval resolution of pleural effusions. No evidence of CHF. Electronically Signed   By: David  Martinique M.D.   On: 08/15/2016 12:13    ECG & Cardiac Imaging    Diffuse ST  depression in version. ST elevated in V1-2 of 48mm.   Assessment & Plan   Ms Gair has CAD, HTN, DM and cont to smoke. Her BP was elevated at the time of her CP. Unclear what started first. I am most suspicious for a HTN urgency though. Her BP is known to have episodic spikes. Surprisingly her BP has been consistently well controlled during outpt visits last month with current regimen. Secondary causes of her HTN were explored (pheo, RAS). Appears that she might have Hyperaldo given response to spironolactone. Need to consider other causes of episodic BP raises if truly the origin of her symptoms. However, need also to be vigilant about possible SVG dysfunction with occlusion. Trop so far neg. Could remain neg even if 1 or more SVG down.    Recommendations: - Trend ECG and Trop - If both resolve with improved BP control I would hold of LHC - If ECG cont to show ischemic changes and trop either remains normal or turns + would go for [facility] - Keep NPO    Signed, Cristina Gong, MD 08/28/2016, 11:12 PM

## 2016-08-28 NOTE — ED Triage Notes (Signed)
Pt to ED with c/o left chest pain onset last pm but worse today with nausea and vomiting.  St's vomited x's 1.  Pt st's she took NTG SL with relief.

## 2016-08-28 NOTE — H&P (Signed)
History and Physical    Erika [name] [id number] DOB: August 27, 1971 DOA: 08/28/2016   PCP: Arnette Norris, MD Chief Complaint:  Chief Complaint  Patient presents with  . Chest Pain    HPI: Erika [name] is a 45 y.o. female with medical history significant of CAD s/p 4 vessel CABG and R CEA performed less than 2 months ago following NSTEMI, malignant HTN, DM on metformin, ? RAS (shows up on [name], but not on renal artery cath).  Supposed to go to cardiac rehab next month.  Patient presents to ED with chest pain: 30 mins duration of chest pain that onset earlier today.  CP completely resolved with NTG, was associated with high BP at home (took BP at home and was [id number] systolic).  Had associated diaphoresis, dizziness, nausea, SOB with episode, vomiting.  ED Course: EKG shows chronic ischemic changes, they do not think there is true anterior wall STEMI.  Review of Systems: As per HPI otherwise 10 point review of systems negative.    Past Medical History:  Diagnosis Date  . Arthralgia of temporomandibular joint   . CAD, multiple vessel    a. cath 06/29/16: ostLM 40%, ostLAD 40%, pLAD 95%, ost-pLCx 60%, pLCx 95%, mLCx 60%, mRCA 95%, D2 50%, LVSF nl  . Depression   . Diastolic dysfunction    a.e cho 06/28/16: EF 50-55%, mild inf wall HK, GR1DD, mild MR, RV sys fxn nl, mildly dilated LA, PASP nl  . Fatty liver disease, nonalcoholic Q000111Q  . HLD (hyperlipidemia)   . Malignant hypertension   . Obesity   . PTSD (post-traumatic stress disorder)   . Tobacco abuse   . Type 2 diabetes mellitus [facility]) January 2017    Past Surgical History:  Procedure Laterality Date  . CARDIAC CATHETERIZATION [name] 06/29/2016   Procedure: Left Heart Cath and Coronary Angiography;  Surgeon: Minna Merritts, MD;  Location: [place] CV LAB;  Service: Cardiovascular;  Laterality: [name];  . CESAREAN SECTION    . CHOLECYSTECTOMY    . CORONARY ARTERY BYPASS GRAFT [name] 07/06/2016   Procedure: CORONARY ARTERY  BYPASS GRAFTING (CABG) x four, using left internal mammary artery and right leg greater saphenous vein harvested endoscopically;  Surgeon: Ivin Poot, MD;  Location: [place];  Service: Open Heart Surgery;  Laterality: [name];  . ENDARTERECTOMY Right 07/06/2016   Procedure: ENDARTERECTOMY CAROTID;  Surgeon: Rosetta Posner, MD;  Location: [place];  Service: Vascular;  Laterality: Right;  . PERIPHERAL VASCULAR CATHETERIZATION [name] 04/18/2016   Procedure: Renal Angiography;  Surgeon: Wellington Hampshire, MD;  Location: [place] CV LAB;  Service: Cardiovascular;  Laterality: [name];  . TEE WITHOUT CARDIOVERSION [name] 07/06/2016   Procedure: TRANSESOPHAGEAL ECHOCARDIOGRAM (TEE);  Surgeon: Ivin Poot, MD;  Location: [place];  Service: Open Heart Surgery;  Laterality: [name];  . TONSILLECTOMY       reports that she quit smoking about 2 months ago. Her smoking use included Cigarettes. She smoked 0.50 packs per day. She has never used smokeless tobacco. She reports that she does not drink alcohol or use drugs.  Allergies  Allergen Reactions  . No Known Allergies     Family History  Problem Relation Age of Onset  . Adopted: Yes  . Diabetes Mother   . Diabetes Father   . Alcohol abuse Father   . Heart disease Father   . Drug abuse Father   . Stroke Sister   . Anxiety disorder Sister       Prior  to Admission medications   Medication Sig Start Date End Date Taking? Authorizing Provider  aspirin EC 325 MG EC tablet Take 1 tablet (325 mg total) by mouth daily. 07/13/16  Yes Donielle Liston Alba, PA-C  atorvastatin (LIPITOR) 80 MG tablet Take 1 tablet (80 mg total) by mouth daily at 6 PM. 07/13/16  Yes Donielle Liston Alba, PA-C  carvedilol (COREG) 12.5 MG tablet Take 1 tablet (12.5 mg total) by mouth 2 (two) times daily. 07/13/16  Yes Donielle Liston Alba, PA-C  clonazePAM (KLONOPIN) 0.5 MG tablet Take 1 tablet (0.5 mg total) by mouth 3 (three) times daily. 06/19/16 06/19/17 Yes Kathlee Nations, MD  FLUoxetine  (PROZAC) 40 MG capsule Take 1 capsule (40 mg total) by mouth daily. 06/19/16  Yes Kathlee Nations, MD  HYDROcodone-acetaminophen (NORCO/VICODIN) 5-325 MG tablet Take 1 tablet by mouth every 4 (four) hours as needed (for cluster headaches).  06/27/16  Yes Historical Provider, MD  lamoTRIgine (LAMICTAL) 200 MG tablet Take 1 tablet (200 mg total) by mouth daily. 06/19/16  Yes Kathlee Nations, MD  metFORMIN (GLUCOPHAGE) 500 MG tablet Take 500 mg by mouth daily with breakfast.   Yes Historical Provider, MD  spironolactone (ALDACTONE) 25 MG tablet Take 1 tablet (25 mg total) by mouth daily. Patient taking differently: Take 25 mg by mouth at bedtime.  08/14/16  Yes Wellington Hampshire, MD    Physical Exam: Vitals:   08/28/16 1958 08/28/16 2001  BP:  173/96  Pulse:  95  Resp:  24  Temp:  98.2 F (36.8 C)  TempSrc:  Oral  SpO2:  96%  Weight: 92.1 kg (203 lb)   Height: 5\' 7"  (1.702 m)       Constitutional: NAD, calm, comfortable Eyes: PERRL, lids and conjunctivae normal ENMT: Mucous membranes are moist. Posterior pharynx clear of any exudate or lesions.Normal dentition.  Neck: normal, supple, no masses, no thyromegaly Respiratory: clear to auscultation bilaterally, no wheezing, no crackles. Normal respiratory effort. No accessory muscle use.  Cardiovascular: Regular rate and rhythm, no murmurs / rubs / gallops. No extremity edema. 2+ pedal pulses. No carotid bruits.  Abdomen: no tenderness, no masses palpated. No hepatosplenomegaly. Bowel sounds positive.  Musculoskeletal: no clubbing / cyanosis. No joint deformity upper and lower extremities. Good ROM, no contractures. Normal muscle tone.  Skin: no rashes, lesions, ulcers. No induration Neurologic: CN 2-12 grossly intact. Sensation intact, DTR normal. Strength 5/5 in all 4.  Psychiatric: Normal judgment and insight. Alert and oriented x 3. Normal mood.    Labs on Admission: I have personally reviewed following labs and imaging  studies  CBC:  Recent Labs Lab 08/28/16 2031  WBC 12.0*  HGB 11.5*  HCT 36.7  MCV 83.4  PLT [id number]*   Basic Metabolic Panel:  Recent Labs Lab 08/28/16 2031  NA 139  K 3.6  CL 105  CO2 27  GLUCOSE 127*  BUN 8  CREATININE 0.68  CALCIUM 10.1   GFR: Estimated Creatinine Clearance: 106.7 mL/min (by C-G formula based on SCr of 0.8 mg/dL). Liver Function Tests: No results for input(s): AST, ALT, ALKPHOS, BILITOT, PROT, ALBUMIN in the last 168 hours. No results for input(s): LIPASE, AMYLASE in the last 168 hours. No results for input(s): AMMONIA in the last 168 hours. Coagulation Profile: No results for input(s): INR, PROTIME in the last 168 hours. Cardiac Enzymes: No results for input(s): CKTOTAL, CKMB, CKMBINDEX, TROPONINI in the last 168 hours. BNP (last 3 results) No results for input(s): PROBNP in the  last 8760 hours. HbA1C: No results for input(s): HGBA1C in the last 72 hours. CBG: No results for input(s): GLUCAP in the last 168 hours. Lipid Profile: No results for input(s): CHOL, HDL, LDLCALC, TRIG, CHOLHDL, LDLDIRECT in the last 72 hours. Thyroid Function Tests: No results for input(s): TSH, T4TOTAL, FREET4, T3FREE, THYROIDAB in the last 72 hours. Anemia Panel: No results for input(s): VITAMINB12, FOLATE, FERRITIN, TIBC, IRON, RETICCTPCT in the last 72 hours. Urine analysis:    Component Value Date/Time   COLORURINE YELLOW 06/29/2016 1222   APPEARANCEUR CLEAR 06/29/2016 1222   APPEARANCEUR Hazy 01/11/2015 1022   LABSPEC >1.046 (H) 06/29/2016 1222   LABSPEC 1.024 01/11/2015 1022   PHURINE 5.5 06/29/2016 1222   GLUCOSEU NEGATIVE 06/29/2016 1222   GLUCOSEU Negative 01/11/2015 1022   HGBUR MODERATE (A) 06/29/2016 1222   BILIRUBINUR NEGATIVE 06/29/2016 1222   BILIRUBINUR Negative 01/11/2015 1022   KETONESUR NEGATIVE 06/29/2016 1222   PROTEINUR NEGATIVE 06/29/2016 1222   UROBILINOGEN 0.2 05/28/2009 2009   NITRITE NEGATIVE 06/29/2016 1222   LEUKOCYTESUR  NEGATIVE 06/29/2016 1222   LEUKOCYTESUR Negative 01/11/2015 1022   Sepsis Labs: @LABRCNTIP (procalcitonin:4,lacticidven:4) )No results found for this or any previous visit (from the past 240 hour(s)).   Radiological Exams on Admission: Dg Chest 2 View  Result Date: 08/28/2016 CLINICAL DATA:  Left chest pain and left jaw pain with nausea and vomiting for 2 days. Bypass surgery 1 month ago. EXAM: CHEST  2 VIEW COMPARISON:  08/15/2016 FINDINGS: Mild enlargement of the cardiopericardial silhouette, without edema. Prior CABG. No pleural effusion or airspace opacity. No additional significant findings. IMPRESSION: 1. Stable mild enlargement of the cardiopericardial silhouette, with prior CABG. No edema or other acute findings. Electronically Signed   By: Van Clines M.D.   On: 08/28/2016 21:46    EKG: Independently reviewed.  Assessment/Plan Principal Problem:   Chest pain with high risk for cardiac etiology Active Problems:   Essential hypertension, malignant   Diabetes ([place])   CAD in native artery   Carotid stenosis   S/P CABG x 4    1. Chest pain - 1. Could be episode of malignant HTN but I have no way to prove this 2. Certainly she is high enough risk being less than 2 months out from 4 vessel CABG 3. CP obs pathway 4. NTG paste 5. Getting cards consult 6. NPO after midnight 7. Serial trops 8. Tele monitor 2. Carotid stenosis - 1. S/P CEA 2. If she is indeed having episodic malignant HTN, probably want to get baseline BP significantly down now as her brain is actually seeing these elevated BPs post CEA. 3. Malignant HTN - continue home meds for now, may need additional BP meds added 4. DM - holding metformin   DVT prophylaxis: Lovenox Code Status: Full Family Communication: Family at bedside Consults called: Cards called for consult. Admission status: Admit to inpatient   Etta Quill DO [facility] Pager [phone] from 7PM-7AM  If 7AM-7PM, please  contact the day physician for the patient [email or web address] Password [email or web address]  08/28/2016, 10:39 PM

## 2016-08-29 ENCOUNTER — Encounter (HOSPITAL_COMMUNITY): Payer: Self-pay | Admitting: Nurse Practitioner

## 2016-08-29 ENCOUNTER — Inpatient Hospital Stay (HOSPITAL_COMMUNITY)
Admission: EM | Disposition: A | Discharge status: Home / Self Care | Payer: Self-pay | Source: Home / Self Care | Attending: Internal Medicine

## 2016-08-29 DIAGNOSIS — E785 Hyperlipidemia, unspecified: Secondary | ICD-10-CM

## 2016-08-29 DIAGNOSIS — I214 Non-ST elevation (NSTEMI) myocardial infarction: Principal | ICD-10-CM

## 2016-08-29 HISTORY — PX: CARDIAC CATHETERIZATION: SHX172

## 2016-08-29 LAB — PROTIME-INR
INR: 1.06
Prothrombin Time: 13.8 seconds (ref 11.4–15.2)

## 2016-08-29 LAB — HEPARIN LEVEL (UNFRACTIONATED): HEPARIN UNFRACTIONATED: 0.24 [IU]/mL — AB (ref 0.30–0.70)

## 2016-08-29 LAB — GLUCOSE, CAPILLARY: GLUCOSE-CAPILLARY: 120 mg/dL — AB (ref 65–99)

## 2016-08-29 LAB — POCT ACTIVATED CLOTTING TIME: ACTIVATED CLOTTING TIME: 131 s

## 2016-08-29 LAB — TROPONIN I
TROPONIN I: 0.14 ng/mL — AB (ref <0.03)
TROPONIN I: 0.14 ng/mL — AB (ref <0.03)

## 2016-08-29 LAB — MRSA PCR SCREENING: MRSA by PCR: NEGATIVE

## 2016-08-29 LAB — PREGNANCY, URINE: PREG TEST UR: NEGATIVE

## 2016-08-29 SURGERY — LEFT HEART CATH AND CORS/GRAFTS ANGIOGRAPHY
Anesthesia: LOCAL

## 2016-08-29 MED ORDER — SODIUM CHLORIDE 0.9 % IV SOLN: 250.0000 mL | INTRAVENOUS | Status: DC | PRN

## 2016-08-29 MED ORDER — MIDAZOLAM HCL 2 MG/2ML IJ SOLN
INTRAMUSCULAR | Status: AC
  Filled 2016-08-29: qty 2

## 2016-08-29 MED ORDER — VERAPAMIL HCL 2.5 MG/ML IV SOLN
INTRAVENOUS | Status: AC
  Filled 2016-08-29: qty 2

## 2016-08-29 MED ORDER — IOPAMIDOL (ISOVUE-370) INJECTION 76%
INTRAVENOUS | Status: AC
  Filled 2016-08-29: qty 125

## 2016-08-29 MED ORDER — SODIUM CHLORIDE 0.9 % WEIGHT BASED INFUSION: 3.0000 mL/kg/h | INTRAVENOUS | Status: DC

## 2016-08-29 MED ORDER — LIDOCAINE HCL (PF) 1 % IJ SOLN
INTRAMUSCULAR | Status: AC
  Filled 2016-08-29: qty 30

## 2016-08-29 MED ORDER — SODIUM CHLORIDE 0.9% FLUSH: 3.0000 mL | INTRAVENOUS | Status: DC | PRN

## 2016-08-29 MED ORDER — ISOSORBIDE MONONITRATE ER 30 MG PO TB24
30.0000 mg | ORAL_TABLET | Freq: Every day | ORAL | Status: DC
  Administered 2016-08-30: 30 mg via ORAL
  Filled 2016-08-29: qty 1

## 2016-08-29 MED ORDER — HEPARIN (PORCINE) IN NACL 2-0.9 UNIT/ML-% IJ SOLN
INTRAMUSCULAR | Status: DC | PRN
Start: 2016-08-29 — End: 2016-08-29
  Administered 2016-08-29: 1000 mL

## 2016-08-29 MED ORDER — FENTANYL CITRATE (PF) 100 MCG/2ML IJ SOLN
INTRAMUSCULAR | Status: DC | PRN
  Administered 2016-08-29 (×3): 50 ug via INTRAVENOUS

## 2016-08-29 MED ORDER — SODIUM CHLORIDE 0.9 % WEIGHT BASED INFUSION: 1.0000 mL/kg/h | INTRAVENOUS | Status: DC

## 2016-08-29 MED ORDER — IOPAMIDOL (ISOVUE-370) INJECTION 76%
INTRAVENOUS | Status: DC | PRN
  Administered 2016-08-29: 110 mL via INTRA_ARTERIAL

## 2016-08-29 MED ORDER — LIVING WELL WITH DIABETES BOOK
Freq: Once | Status: DC
  Filled 2016-08-29: qty 1

## 2016-08-29 MED ORDER — CARVEDILOL 25 MG PO TABS
25.0000 mg | ORAL_TABLET | Freq: Two times a day (BID) | ORAL | Status: DC
  Administered 2016-08-30: 25 mg via ORAL
  Filled 2016-08-29: qty 1

## 2016-08-29 MED ORDER — SODIUM CHLORIDE 0.9 % WEIGHT BASED INFUSION: 1.0000 mL/kg/h | INTRAVENOUS | Status: AC

## 2016-08-29 MED ORDER — MIDAZOLAM HCL 2 MG/2ML IJ SOLN
INTRAMUSCULAR | Status: DC | PRN
  Administered 2016-08-29 (×3): 1 mg via INTRAVENOUS

## 2016-08-29 MED ORDER — HEPARIN BOLUS VIA INFUSION
4000.0000 [IU] | Freq: Once | INTRAVENOUS | Status: AC
  Administered 2016-08-29: 4000 [IU] via INTRAVENOUS
  Filled 2016-08-29: qty 4000

## 2016-08-29 MED ORDER — LIDOCAINE HCL (PF) 1 % IJ SOLN
INTRAMUSCULAR | Status: DC | PRN
  Administered 2016-08-29: 3 mL
  Administered 2016-08-29: 18 mL

## 2016-08-29 MED ORDER — HEPARIN (PORCINE) IN NACL 100-0.45 UNIT/ML-% IJ SOLN
1100.0000 [IU]/h | INTRAMUSCULAR | Status: DC
  Administered 2016-08-29: 1100 [IU]/h via INTRAVENOUS
  Filled 2016-08-29: qty 250

## 2016-08-29 MED ORDER — SODIUM CHLORIDE 0.9% FLUSH: 3.0000 mL | Freq: Two times a day (BID) | INTRAVENOUS | Status: DC

## 2016-08-29 MED ORDER — SODIUM CHLORIDE 0.9% FLUSH
3.0000 mL | Freq: Two times a day (BID) | INTRAVENOUS | Status: DC
  Administered 2016-08-29 – 2016-08-30 (×2): 3 mL via INTRAVENOUS

## 2016-08-29 MED ORDER — FENTANYL CITRATE (PF) 100 MCG/2ML IJ SOLN
INTRAMUSCULAR | Status: AC
  Filled 2016-08-29: qty 2

## 2016-08-29 MED ORDER — HEPARIN (PORCINE) IN NACL 2-0.9 UNIT/ML-% IJ SOLN
INTRAMUSCULAR | Status: AC
  Filled 2016-08-29: qty 1000

## 2016-08-29 SURGICAL SUPPLY — 13 items
CATH INFINITI 5FR ANG PIGTAIL (CATHETERS) ×1 IMPLANT
CATH INFINITI 5FR JL4 (CATHETERS) ×1 IMPLANT
CATH INFINITI JR4 5F (CATHETERS) ×1 IMPLANT
COVER PRB 48X5XTLSCP FOLD TPE (BAG) IMPLANT
COVER PROBE 5X48 (BAG) ×2
GLIDESHEATH SLEND SS 6F .021 (SHEATH) ×1 IMPLANT
KIT HEART LEFT (KITS) ×2 IMPLANT
PACK CARDIAC CATHETERIZATION (CUSTOM PROCEDURE TRAY) ×2 IMPLANT
SHEATH PINNACLE 5F 10CM (SHEATH) ×1 IMPLANT
SYR MEDRAD MARK V 150ML (SYRINGE) ×2 IMPLANT
TRANSDUCER W/STOPCOCK (MISCELLANEOUS) ×2 IMPLANT
TUBING CIL FLEX 10 FLL-RA (TUBING) ×2 IMPLANT
WIRE EMERALD 3MM-J .035X150CM (WIRE) ×1 IMPLANT

## 2016-08-29 NOTE — Progress Notes (Signed)
Site area: Right groin a 5 french arterial sheath was removed  Site Prior to Removal:  Level 0  Pressure Applied For 15 MINUTES    Bedrest Beginning at 1825p  Manual:   Yes.    Patient Status During Pull:  stable  Post Pull Groin Site:  Level 0  Post Pull Instructions Given:  Yes.    Post Pull Pulses Present:  Yes.    Dressing Applied:  Yes.    Comments:  VS remain stable during sheath pull 

## 2016-08-29 NOTE — Progress Notes (Signed)
ANTICOAGULATION CONSULT NOTE - Initial Consult  Pharmacy Consult for Heparin Indication: chest pain/ACS  Allergies  Allergen Reactions  . No Known Allergies     Patient Measurements: Height: 5\' 7"  (170.2 cm) Weight: 200 lb 8 oz (90.9 kg) IBW/kg (Calculated) : 61.6 Heparin Dosing Weight: 85 kg  Vital Signs: Temp: 97.8 F (36.6 C) (09/06 0057) Temp Source: Oral (09/06 0057) BP: 134/82 (09/06 0321) Pulse Rate: 93 (09/06 0321)  Labs:  Recent Labs  08/28/16 2031 08/28/16 2238 08/29/16 0217 08/29/16 0423  HGB 11.5*  --   --   --   HCT 36.7  --   --   --   PLT 450*  --   --   --   CREATININE 0.68  --   --   --   TROPONINI  --  0.09* 0.14* 0.14*    Estimated Creatinine Clearance: 106 mL/min (by C-G formula based on SCr of 0.8 mg/dL).   Medical History: Past Medical History:  Diagnosis Date  . Arthralgia of temporomandibular joint   . CAD, multiple vessel    a. cath 06/29/16: ostLM 40%, ostLAD 40%, pLAD 95%, ost-pLCx 60%, pLCx 95%, mLCx 60%, mRCA 95%, D2 50%, LVSF nl  . Depression   . Diastolic dysfunction    a.e cho 06/28/16: EF 50-55%, mild inf wall HK, GR1DD, mild MR, RV sys fxn nl, mildly dilated LA, PASP nl  . Fatty liver disease, nonalcoholic Q000111Q  . HLD (hyperlipidemia)   . Malignant hypertension   . Obesity   . PTSD (post-traumatic stress disorder)   . Tobacco abuse   . Type 2 diabetes mellitus [facility]) January 2017    Medications:  Prescriptions Prior to Admission  Medication Sig Dispense Refill Last Dose  . aspirin EC 325 MG EC tablet Take 1 tablet (325 mg total) by mouth daily. 30 tablet 0 08/28/2016 at [place]  . atorvastatin (LIPITOR) 80 MG tablet Take 1 tablet (80 mg total) by mouth daily at 6 PM. 30 tablet 1 08/27/2016 at Unknown time  . carvedilol (COREG) 12.5 MG tablet Take 1 tablet (12.5 mg total) by mouth 2 (two) times daily. 60 tablet 1 08/28/2016 at 0845  . clonazePAM (KLONOPIN) 0.5 MG tablet Take 1 tablet (0.5 mg total) by mouth 3 (three) times daily. 90  tablet 2 08/28/2016 at Unknown time  . FLUoxetine (PROZAC) 40 MG capsule Take 1 capsule (40 mg total) by mouth daily. 30 capsule 2 08/28/2016 at Unknown time  . HYDROcodone-acetaminophen (NORCO/VICODIN) 5-325 MG tablet Take 1 tablet by mouth every 4 (four) hours as needed (for cluster headaches).   0 2 months  . lamoTRIgine (LAMICTAL) 200 MG tablet Take 1 tablet (200 mg total) by mouth daily. 30 tablet 2 08/28/2016 at Unknown time  . metFORMIN (GLUCOPHAGE) 500 MG tablet Take 500 mg by mouth daily with breakfast.   08/28/2016 at Unknown time  . spironolactone (ALDACTONE) 25 MG tablet Take 1 tablet (25 mg total) by mouth daily. (Patient taking differently: Take 25 mg by mouth at bedtime. ) 30 tablet 3 08/27/2016 at Unknown time    Assessment: 45 y.o. female with chest pain, elevated cardiac markers, for heparin  Goal of Therapy:  Heparin level 0.3-0.7 units/ml Monitor platelets by anticoagulation protocol: Yes   Plan:  D/C Lovenox Heparin 4000 units IV bolus, then start heparin 1100 units/hr Check heparin level in 6 hours.   Anjanette Gilkey, [place] Curb 08/29/2016,6:30 AM

## 2016-08-29 NOTE — Progress Notes (Signed)
ANTICOAGULATION CONSULT NOTE - Initial Consult  Pharmacy Consult for Heparin Indication: chest pain/ACS  Allergies  Allergen Reactions  . No Known Allergies     Patient Measurements: Height: 5\' 7"  (170.2 cm) Weight: 200 lb 8 oz (90.9 kg) IBW/kg (Calculated) : 61.6 Heparin Dosing Weight: 85 kg  Vital Signs: Temp: 98.2 F (36.8 C) (09/06 1214) Temp Source: Oral (09/06 1214) BP: 132/82 (09/06 1214) Pulse Rate: 69 (09/06 1214)  Labs:  Recent Labs  08/28/16 2031 08/28/16 2238 08/29/16 0217 08/29/16 0423 08/29/16 1405  HGB 11.5*  --   --   --   --   HCT 36.7  --   --   --   --   PLT 450*  --   --   --   --   LABPROT  --   --   --   --  13.8  INR  --   --   --   --  1.06  HEPARINUNFRC  --   --   --   --  0.24*  CREATININE 0.68  --   --   --   --   TROPONINI  --  0.09* 0.14* 0.14*  --     Estimated Creatinine Clearance: 106 mL/min (by C-G formula based on SCr of 0.8 mg/dL).   Medical History: Past Medical History:  Diagnosis Date  . Arthralgia of temporomandibular joint   . CAD, multiple vessel    a. cath 06/29/16: ostLM 40%, ostLAD 40%, pLAD 95%, ost-pLCx 60%, pLCx 95%, mLCx 60%, mRCA 95%, D2 50%, LVSF nl;  b. 07/2016 CABG x 4 (LIMA->LAD, VG->Diag, VG->OM, VG->RCA).  . Carotid arterial disease ([place])    a. 07/2016 s/p R CEA.  . Depression   . Diastolic dysfunction    a.e cho 06/28/16: EF 50-55%, mild inf wall HK, GR1DD, mild MR, RV sys fxn nl, mildly dilated LA, PASP nl  . Fatty liver disease, nonalcoholic [id number]  . HLD (hyperlipidemia)   . Malignant hypertension   . Obesity   . PTSD (post-traumatic stress disorder)   . Tobacco abuse   . Type 2 diabetes mellitus [facility]) January 2017    Medications:  Prescriptions Prior to Admission  Medication Sig Dispense Refill Last Dose  . aspirin EC 325 MG EC tablet Take 1 tablet (325 mg total) by mouth daily. 30 tablet 0 08/28/2016 at [place]  . atorvastatin (LIPITOR) 80 MG tablet Take 1 tablet (80 mg total) by mouth daily at 6 PM.  30 tablet 1 08/27/2016 at Unknown time  . carvedilol (COREG) 12.5 MG tablet Take 1 tablet (12.5 mg total) by mouth 2 (two) times daily. 60 tablet 1 08/28/2016 at 0845  . clonazePAM (KLONOPIN) 0.5 MG tablet Take 1 tablet (0.5 mg total) by mouth 3 (three) times daily. 90 tablet 2 08/28/2016 at Unknown time  . FLUoxetine (PROZAC) 40 MG capsule Take 1 capsule (40 mg total) by mouth daily. 30 capsule 2 08/28/2016 at Unknown time  . HYDROcodone-acetaminophen (NORCO/VICODIN) 5-325 MG tablet Take 1 tablet by mouth every 4 (four) hours as needed (for cluster headaches).   0 2 months  . lamoTRIgine (LAMICTAL) 200 MG tablet Take 1 tablet (200 mg total) by mouth daily. 30 tablet 2 08/28/2016 at Unknown time  . metFORMIN (GLUCOPHAGE) 500 MG tablet Take 500 mg by mouth daily with breakfast.   08/28/2016 at Unknown time  . spironolactone (ALDACTONE) 25 MG tablet Take 1 tablet (25 mg total) by mouth daily. (Patient taking differently: Take 25 mg  by mouth at bedtime. ) 30 tablet 3 08/27/2016 at Unknown time    Assessment: 45 y.o. female with NSTEMI on heparin with plans for cath today (~ 4pm) -initital heparin level is 0.24 and below goal  Goal of Therapy:  Heparin level 0.3-0.7 units/ml Monitor platelets by anticoagulation protocol: Yes   Plan:  -No heparin adjustments due to cath this afternoon -Will follow plans post cath  Hildred Laser, Pharm D 08/29/2016 3:09 PM

## 2016-08-29 NOTE — ED Notes (Signed)
Dr. Alcario Drought made aware of pt's Troponin of 0.09.  No new orders recieved

## 2016-08-29 NOTE — Interval H&P Note (Signed)
Cath Lab Visit (complete for each Cath Lab visit)  Clinical Evaluation Leading to the Procedure:   ACS: Yes.    Non-ACS:    Anginal Classification: CCS IV  Anti-ischemic medical therapy: Minimal Therapy (1 class of medications)  Non-Invasive Test Results: No non-invasive testing performed  Prior CABG: Previous CABG      History and Physical Interval Note:  08/29/2016 4:35 PM  Erika [name]  has presented today for surgery, with the diagnosis of cp  The various methods of treatment have been discussed with the patient and family. After consideration of risks, benefits and other options for treatment, the patient has consented to  Procedure(s): Left Heart Cath and Cors/Grafts Angiography ([name]) as a surgical intervention .  The patient's history has been reviewed, patient examined, no change in status, stable for surgery.  I have reviewed the patient's chart and labs.  Questions were answered to the patient's satisfaction.     [name]

## 2016-08-29 NOTE — Progress Notes (Signed)
PROGRESS NOTE  Erika [name] [id number] DOB: 04-09-1971 DOA: 08/28/2016 PCP: Arnette Norris, MD  HPI/Recap of past 24 hours:  Currently chest pain free, no sob,  Report rash on hand dorsal aspect, started while she was in the ambulance, husband in room  Assessment/Plan: Principal Problem:   NSTEMI (non-ST elevated myocardial infarction) ([place]) Active Problems:   HLD (hyperlipidemia)   Essential hypertension, malignant   Diabetes ([place])   CAD in native artery   Carotid stenosis   S/P CABG x 4   Chest pain with CAD s/p 4 vessel CABG and R CEA performed less than 2 months ago following NSTEMI,  Troponin mildly elevated this time, ekg with diffuse st/t flattening and inversion, she is started on heparin drip, asa, statin, coreg, cardiology consulted, to have cardiac cath today  CAD s/p CABG as above  Carotid stenosis: s/p CEA  HTN: continue adjust bp meds  noninsulin dependent dm2, metformin held in the hospital, a1c pending, most recent a1c 10.4, will discuss with patient about starting insulin  Smoker: smoking cessation education provided.   Rash: denies recent new meds, on dorsal hand only, continue to monitor  Body mass index is 31.4 kg/m.    DVT prophylaxis: Lovenox Code Status: Full Family Communication: patient and husband at bedside Consults called: Cards   Disposition Plan: remain in the hospital   Consultants:  cardiology  Procedures:  Cardiac cath on 9/6  Antibiotics:  none   Objective: BP 137/88   Pulse 80   Temp 98 F (36.7 C) (Oral)   Resp 17   Ht 5\' 7"  (1.702 m)   Wt 90.9 kg (200 lb 8 oz)   LMP 08/21/2016   SpO2 99%   BMI 31.40 kg/m   Intake/Output Summary (Last 24 hours) at 08/29/16 1141 Last data filed at 08/29/16 0900  Gross per 24 hour  Intake                0 ml  Output                0 ml  Net                0 ml   Filed Weights   08/28/16 1958 08/29/16 0057  Weight: 92.1 kg (203 lb) 90.9 kg (200 lb 8 oz)     Exam:   General:  NAD  Cardiovascular: RRR  Respiratory: CTABL  Abdomen: Soft/ND/NT, positive BS  Musculoskeletal: No Edema, diffuse rash on dorsal hand , more on the right hand  Neuro: aaox3  Data Reviewed: Basic Metabolic Panel:  Recent Labs Lab 08/28/16 2031  NA 139  K 3.6  CL 105  CO2 27  GLUCOSE 127*  BUN 8  CREATININE 0.68  CALCIUM 10.1   Liver Function Tests: No results for input(s): AST, ALT, ALKPHOS, BILITOT, PROT, ALBUMIN in the last 168 hours. No results for input(s): LIPASE, AMYLASE in the last 168 hours. No results for input(s): AMMONIA in the last 168 hours. CBC:  Recent Labs Lab 08/28/16 2031  WBC 12.0*  HGB 11.5*  HCT 36.7  MCV 83.4  PLT 450*   Cardiac Enzymes:    Recent Labs Lab 08/28/16 2238 08/29/16 0217 08/29/16 0423  TROPONINI 0.09* 0.14* 0.14*   BNP (last 3 results)  Recent Labs  06/30/16 0345  BNP 120.7*    ProBNP (last 3 results) No results for input(s): PROBNP in the last 8760 hours.  CBG: No results for input(s): GLUCAP in the last 168  hours.  Recent Results (from the past 240 hour(s))  MRSA PCR Screening     Status: None   Collection Time: 08/29/16 12:45 AM  Result Value Ref Range Status   MRSA by PCR NEGATIVE NEGATIVE Final    Comment:        The GeneXpert MRSA Assay (FDA approved for NASAL specimens only), is one component of a comprehensive MRSA colonization surveillance program. It is not intended to diagnose MRSA infection nor to guide or monitor treatment for MRSA infections.      Studies: Dg Chest 2 View  Result Date: 08/28/2016 CLINICAL DATA:  Left chest pain and left jaw pain with nausea and vomiting for 2 days. Bypass surgery 1 month ago. EXAM: CHEST  2 VIEW COMPARISON:  08/15/2016 FINDINGS: Mild enlargement of the cardiopericardial silhouette, without edema. Prior CABG. No pleural effusion or airspace opacity. No additional significant findings. IMPRESSION: 1. Stable mild enlargement of  the cardiopericardial silhouette, with prior CABG. No edema or other acute findings. Electronically Signed   By: Van Clines M.D.   On: 08/28/2016 21:46    Scheduled Meds: . aspirin  325 mg Oral Daily  . atorvastatin  80 mg Oral q1800  . carvedilol  12.5 mg Oral BID WC  . clonazePAM  0.5 mg Oral TID  . FLUoxetine  40 mg Oral Daily  . isosorbide mononitrate  30 mg Oral Daily  . lamoTRIgine  200 mg Oral Daily  . sodium chloride flush  3 mL Intravenous Q12H  . spironolactone  25 mg Oral QHS    Continuous Infusions: . sodium chloride     Followed by  . sodium chloride    . heparin 1,100 Units/hr (08/29/16 [id number])     Time spent: 72mins  Tiziana Cislo MD, PhD  [facility] Pager [phone]. If 7PM-7AM, please contact night-coverage at [email or web address], password [facility] 08/29/2016, 11:41 AM  LOS: 1 day

## 2016-08-29 NOTE — Progress Notes (Signed)
Patient Name: Erika [name] Date of Encounter: 08/29/2016  Hospital Problem List     Principal Problem:   NSTEMI (non-ST elevated myocardial infarction) [facility]) Active Problems:   CAD in native artery   S/P CABG x 4   HLD (hyperlipidemia)   Essential hypertension, malignant   Diabetes ([place])   Carotid stenosis    Subjective   She feels better overall.  No chest pain but has had some mild mid-back discomfort, which she identifies as her prior anginal equivalent.  Inpatient Medications    . aspirin  325 mg Oral Daily  . atorvastatin  80 mg Oral q1800  . carvedilol  12.5 mg Oral BID WC  . clonazePAM  0.5 mg Oral TID  . FLUoxetine  40 mg Oral Daily  . lamoTRIgine  200 mg Oral Daily  . spironolactone  25 mg Oral QHS    Vital Signs    Vitals:   08/29/16 0321 08/29/16 0500 08/29/16 0744 08/29/16 0804  BP: 134/82 (!) 144/86 130/79 137/88  Pulse: 93 81 84 80  Resp: 16 18 17    Temp:  97.7 F (36.5 C) 98 F (36.7 C)   TempSrc:  Oral Oral   SpO2: 93% 99% 99%   Weight:      Height:        Intake/Output Summary (Last 24 hours) at 08/29/16 1104 Last data filed at 08/29/16 0900  Gross per 24 hour  Intake                0 ml  Output                0 ml  Net                0 ml   Filed Weights   08/28/16 1958 08/29/16 0057  Weight: 203 lb (92.1 kg) 200 lb 8 oz (90.9 kg)    Physical Exam    GEN: Well nourished, well developed, in no acute distress.  HEENT: normal.  Neck: Supple, no JVD, carotid bruits, or masses. Cardiac: RRR, no murmurs, rubs, or gallops. No clubbing, cyanosis, edema.  Radials/DP/PT 2+ and equal bilaterally.  Respiratory:  Respirations regular and unlabored, clear to auscultation bilaterally. GI: Soft, nontender, nondistended, BS + x 4. MS: no deformity or atrophy. Skin: warm and dry, no rash.  Xanthelasma noted. Neuro:  Strength and sensation are intact. Psych: Normal affect.  Labs    CBC  Recent Labs  08/28/16 2031  WBC 12.0*  HGB  11.5*  HCT 36.7  MCV 83.4  PLT [id number]*   Basic Metabolic Panel  Recent Labs  08/28/16 2031  NA 139  K 3.6  CL 105  CO2 27  GLUCOSE 127*  BUN 8  CREATININE 0.68  CALCIUM 10.1   Cardiac Enzymes  Recent Labs  08/28/16 2238 08/29/16 0217 08/29/16 0423  TROPONINI 0.09* 0.14* 0.14*    Telemetry    RSR  ECG    Rsr, 95, inf and antlat ST dep and TWI.  Slight ST elev/coving in V1-V2.  Radiology    Dg Chest 2 View  Result Date: 08/28/2016 CLINICAL DATA:  Left chest pain and left jaw pain with nausea and vomiting for 2 days. Bypass surgery 1 month ago. EXAM: CHEST  2 VIEW COMPARISON:  08/15/2016 FINDINGS: Mild enlargement of the cardiopericardial silhouette, without edema. Prior CABG. No pleural effusion or airspace opacity. No additional significant findings. IMPRESSION: 1. Stable mild enlargement of the cardiopericardial silhouette, with prior CABG.  No edema or other acute findings. Electronically Signed   By: Van Clines M.D.   On: 08/28/2016 21:46    Patient Summary     45 y/o ? with a h/o HTN, HL, CAD s/p CABG x 4 in 07/2016, Carotid dzs s/p R CEA in 07/2016, obesity, and tob abuse, who was admitted 9/5 with chest pain, hypertensive urgency, and elevated troponin.  Assessment & Plan    1. NSTEMI/CAD:  S/p recent CABG x 4.  She had been doing well @ home but developed mid scapular back pain two nights ago and again last night prompting presentation to the ED via EMS.  Pain last night resolved with sl ntg in about thirty minutes.  Trop has risen to 0.14, though trend is flat.  BP was markedly elevated in ED.  In setting of recent CABG, recurrent angina, and troponin elevation, will plan on cath this afternoon.  The patient understands that risks include but are not limited to stroke (1 in 1000), death (1 in 85), kidney failure [usually temporary] (1 in 500), bleeding (1 in 200), allergic reaction [possibly serious] (1 in 200), and agrees to proceed.  If grafts are patent,  presentation more likely 2/2 demand ischemia in setting of HTN.  Cont heparin, asa, statin, and  blocker.  Will add long acting nitrate.  2.  Hypertensive Urgency:  BP 173/99 in ED.  She says that it has been better controlled since CABG.  Currently better  137/88.  Cont  blocker and spironolactone.  Plan to add nitrate as above.  3.  HL:  LDL 64 on 8/29.  Cont high potency statin therapy.  4.  Carotid Dzs:  S/p R CEA in August.  Cont asa/statin.  5.  Tob Abuse:  Still smoking.  Knows that she needs to quit.  Cessation advised.  6.  Morbid Obesity:  She plans to enroll in cardiac rehab.  Signed, Murray Hodgkins NP   I have seen, examined and evaluated the patient this AM along with Mr. Sharolyn Douglas, NP-C.  After reviewing all the available data and chart, we discussed the patients laboratory, study & physical findings as well as symptoms in detail. I agree with his findings, examination as well as impression recommendations as per our discussion.    Very pleasant, yet unfortunate young woman who is just about a month and a half out from her CABG for multivessel disease. She was doing well until last night when she started having symptoms very reminiscent to her prior anginal equivalent. She is currently comfortable with her blood pressure being reduced, but did have a prolonged episode yesterday and has mild troponin elevation.  Cannot be certain if this is hypertensive urgency with anginal chest pain is resolved versus acute graft failure. In order to clarify, I think the best course of action is to proceed with diagnostic catheterization to confirm graft patency. If this is confirmed, then we can address the hypertensive urgency. The interesting point is that she has not had difficult to control hypertension prior to last night.  Otherwise remains on stable cardiac regimen as noted in note above.  Plan cardiac catheterization today.   Glenetta Hew, M.D., M.S. Interventional Cardiologist     Pager # [phone] Phone # [phone] [address] [place], [place] [address]

## 2016-08-29 NOTE — Progress Notes (Signed)
Paged on call NP Sharolyn Douglas about third troponin of 0.14. Will continue to monitor.

## 2016-08-29 NOTE — Progress Notes (Signed)
About 45 minutes ago patient requested to use the bathroom.  Patient was educated that her bed rest was not up until about 10:30pm.  Bed pan offered patient stated she could not use the bed pan.  RN into complete assessment and educated patient again on importance of maintaining bedrest.  RN educated patient on possible complications such as bleeding at cath site.  RN offered bed pan again and patient replied she was not trying to be difficult but could not use a bed pan.  Patient stated she could not hold it any longer and she understood the risks of getting out of bed before scheduled bedrest ended.  Patient to and from bathroom, RN present.  Right groin checked after ambulation and remains level 0 with gauze and transparent dressing clean, dry and intact.   Will continue to closely monitor.

## 2016-08-29 NOTE — Progress Notes (Addendum)
Paged on call Cards Fudim about second troponin of 0.14. Will continue to monitor.

## 2016-08-29 NOTE — H&P (View-Only) (Signed)
Patient Name: Erika [name] Date of Encounter: 08/29/2016  Hospital Problem List     Principal Problem:   NSTEMI (non-ST elevated myocardial infarction) [facility]) Active Problems:   CAD in native artery   S/P CABG x 4   HLD (hyperlipidemia)   Essential hypertension, malignant   Diabetes ([place])   Carotid stenosis    Subjective   She feels better overall.  No chest pain but has had some mild mid-back discomfort, which she identifies as her prior anginal equivalent.  Inpatient Medications    . aspirin  325 mg Oral Daily  . atorvastatin  80 mg Oral q1800  . carvedilol  12.5 mg Oral BID WC  . clonazePAM  0.5 mg Oral TID  . FLUoxetine  40 mg Oral Daily  . lamoTRIgine  200 mg Oral Daily  . spironolactone  25 mg Oral QHS    Vital Signs    Vitals:   08/29/16 0321 08/29/16 0500 08/29/16 0744 08/29/16 0804  BP: 134/82 (!) 144/86 130/79 137/88  Pulse: 93 81 84 80  Resp: 16 18 17    Temp:  97.7 F (36.5 C) 98 F (36.7 C)   TempSrc:  Oral Oral   SpO2: 93% 99% 99%   Weight:      Height:        Intake/Output Summary (Last 24 hours) at 08/29/16 1104 Last data filed at 08/29/16 0900  Gross per 24 hour  Intake                0 ml  Output                0 ml  Net                0 ml   Filed Weights   08/28/16 1958 08/29/16 0057  Weight: 203 lb (92.1 kg) 200 lb 8 oz (90.9 kg)    Physical Exam    GEN: Well nourished, well developed, in no acute distress.  HEENT: normal.  Neck: Supple, no JVD, carotid bruits, or masses. Cardiac: RRR, no murmurs, rubs, or gallops. No clubbing, cyanosis, edema.  Radials/DP/PT 2+ and equal bilaterally.  Respiratory:  Respirations regular and unlabored, clear to auscultation bilaterally. GI: Soft, nontender, nondistended, BS + x 4. MS: no deformity or atrophy. Skin: warm and dry, no rash.  Xanthelasma noted. Neuro:  Strength and sensation are intact. Psych: Normal affect.  Labs    CBC  Recent Labs  08/28/16 2031  WBC 12.0*  HGB  11.5*  HCT 36.7  MCV 83.4  PLT [id number]*   Basic Metabolic Panel  Recent Labs  08/28/16 2031  NA 139  K 3.6  CL 105  CO2 27  GLUCOSE 127*  BUN 8  CREATININE 0.68  CALCIUM 10.1   Cardiac Enzymes  Recent Labs  08/28/16 2238 08/29/16 0217 08/29/16 0423  TROPONINI 0.09* 0.14* 0.14*    Telemetry    RSR  ECG    Rsr, 95, inf and antlat ST dep and TWI.  Slight ST elev/coving in V1-V2.  Radiology    Dg Chest 2 View  Result Date: 08/28/2016 CLINICAL DATA:  Left chest pain and left jaw pain with nausea and vomiting for 2 days. Bypass surgery 1 month ago. EXAM: CHEST  2 VIEW COMPARISON:  08/15/2016 FINDINGS: Mild enlargement of the cardiopericardial silhouette, without edema. Prior CABG. No pleural effusion or airspace opacity. No additional significant findings. IMPRESSION: 1. Stable mild enlargement of the cardiopericardial silhouette, with prior CABG.  No edema or other acute findings. Electronically Signed   By: Van Clines M.D.   On: 08/28/2016 21:46    Patient Summary     45 y/o ? with a h/o HTN, HL, CAD s/p CABG x 4 in 07/2016, Carotid dzs s/p R CEA in 07/2016, obesity, and tob abuse, who was admitted 9/5 with chest pain, hypertensive urgency, and elevated troponin.  Assessment & Plan    1. NSTEMI/CAD:  S/p recent CABG x 4.  She had been doing well @ home but developed mid scapular back pain two nights ago and again last night prompting presentation to the ED via EMS.  Pain last night resolved with sl ntg in about thirty minutes.  Trop has risen to 0.14, though trend is flat.  BP was markedly elevated in ED.  In setting of recent CABG, recurrent angina, and troponin elevation, will plan on cath this afternoon.  The patient understands that risks include but are not limited to stroke (1 in 1000), death (1 in 74), kidney failure [usually temporary] (1 in 500), bleeding (1 in 200), allergic reaction [possibly serious] (1 in 200), and agrees to proceed.  If grafts are patent,  presentation more likely 2/2 demand ischemia in setting of HTN.  Cont heparin, asa, statin, and  blocker.  Will add long acting nitrate.  2.  Hypertensive Urgency:  BP 173/99 in ED.  She says that it has been better controlled since CABG.  Currently better  137/88.  Cont  blocker and spironolactone.  Plan to add nitrate as above.  3.  HL:  LDL 64 on 8/29.  Cont high potency statin therapy.  4.  Carotid Dzs:  S/p R CEA in August.  Cont asa/statin.  5.  Tob Abuse:  Still smoking.  Knows that she needs to quit.  Cessation advised.  6.  Morbid Obesity:  She plans to enroll in cardiac rehab.  Signed, Erika Hodgkins NP   I have seen, examined and evaluated the patient this AM along with Mr. Sharolyn Douglas, NP-C.  After reviewing all the available data and chart, we discussed the patients laboratory, study & physical findings as well as symptoms in detail. I agree with his findings, examination as well as impression recommendations as per our discussion.    Very pleasant, yet unfortunate young woman who is just about a month and a half out from her CABG for multivessel disease. She was doing well until last night when she started having symptoms very reminiscent to her prior anginal equivalent. She is currently comfortable with her blood pressure being reduced, but did have a prolonged episode yesterday and has mild troponin elevation.  Cannot be certain if this is hypertensive urgency with anginal chest pain is resolved versus acute graft failure. In order to clarify, I think the best course of action is to proceed with diagnostic catheterization to confirm graft patency. If this is confirmed, then we can address the hypertensive urgency. The interesting point is that she has not had difficult to control hypertension prior to last night.  Otherwise remains on stable cardiac regimen as noted in note above.  Plan cardiac catheterization today.   Erika [name], M.D., M.S. Interventional Cardiologist     Pager # [phone] Phone # [phone] [address] [place], [place] [address]

## 2016-08-29 NOTE — Progress Notes (Signed)
Nutrition Brief Note  Patient identified on the Malnutrition Screening Tool (MST) Report. Her weight loss has not been significant for the time frame.   Wt Readings from Last 15 Encounters:  08/29/16 200 lb 8 oz (90.9 kg)  08/15/16 203 lb (92.1 kg)  08/14/16 203 lb 12.8 oz (92.4 kg)  08/07/16 206 lb (93.4 kg)  07/25/16 203 lb 4 oz (92.2 kg)  07/13/16 218 lb 11.1 oz (99.2 kg)  06/29/16 212 lb (96.2 kg)  06/27/16 215 lb (97.5 kg)  05/14/16 221 lb (100.2 kg)  05/14/16 220 lb 4 oz (99.9 kg)  04/18/16 217 lb (98.4 kg)  04/06/16 220 lb 8 oz (100 kg)  03/15/16 219 lb 8 oz (99.6 kg)  03/05/16 217 lb (98.4 kg)  02/24/16 214 lb 4 oz (97.2 kg)    Body mass index is 31.4 kg/m. Patient meets criteria for class 1 obesity based on current BMI.   Current diet order is NPO. Labs and medications reviewed.   No nutrition interventions warranted at this time. If nutrition issues arise, please consult RD.    Molli Barrows, RD, LDN, [place] Pager [phone] After Hours Pager [phone]

## 2016-08-30 ENCOUNTER — Encounter (HOSPITAL_COMMUNITY): Payer: Self-pay | Admitting: Cardiovascular Disease

## 2016-08-30 ENCOUNTER — Telehealth: Payer: Self-pay

## 2016-08-30 ENCOUNTER — Ambulatory Visit: Payer: Self-pay | Admitting: Family Medicine

## 2016-08-30 DIAGNOSIS — I119 Hypertensive heart disease without heart failure: Secondary | ICD-10-CM

## 2016-08-30 LAB — BASIC METABOLIC PANEL
ANION GAP: 7 (ref 5–15)
BUN: 7 mg/dL (ref 6–20)
CALCIUM: 9.8 mg/dL (ref 8.9–10.3)
CO2: 30 mmol/L (ref 22–32)
Chloride: 104 mmol/L (ref 101–111)
Creatinine, Ser: 0.67 mg/dL (ref 0.44–1.00)
GLUCOSE: 111 mg/dL — AB (ref 65–99)
POTASSIUM: 4 mmol/L (ref 3.5–5.1)
Sodium: 141 mmol/L (ref 135–145)

## 2016-08-30 LAB — CBC
HCT: 36.7 % (ref 36.0–46.0)
Hemoglobin: 11.1 g/dL — ABNORMAL LOW (ref 12.0–15.0)
MCH: 25.8 pg — ABNORMAL LOW (ref 26.0–34.0)
MCHC: 30.2 g/dL (ref 30.0–36.0)
MCV: 85.2 fL (ref 78.0–100.0)
PLATELETS: 374 10*3/uL (ref 150–400)
RBC: 4.31 MIL/uL (ref 3.87–5.11)
RDW: 14.2 % (ref 11.5–15.5)
WBC: 9.2 10*3/uL (ref 4.0–10.5)

## 2016-08-30 MED ORDER — ISOSORBIDE MONONITRATE ER 30 MG PO TB24: 30.0000 mg | ORAL_TABLET | Freq: Every day | ORAL | 6 refills | Status: DC

## 2016-08-30 MED ORDER — NITROGLYCERIN 0.4 MG SL SUBL: 0.4000 mg | SUBLINGUAL_TABLET | SUBLINGUAL | 0 refills | Status: DC | PRN

## 2016-08-30 MED ORDER — CARVEDILOL 25 MG PO TABS: 25.0000 mg | ORAL_TABLET | Freq: Two times a day (BID) | ORAL | 6 refills | Status: DC

## 2016-08-30 MED ORDER — METFORMIN HCL 500 MG PO TABS: 500.0000 mg | ORAL_TABLET | Freq: Every day | ORAL | Status: DC

## 2016-08-30 NOTE — Discharge Instructions (Signed)
**PLEASE REMEMBER TO BRING ALL OF YOUR MEDICATIONS TO EACH OF YOUR FOLLOW-UP OFFICE VISITS.  Radial Site Care Refer to this sheet in the next few weeks. These instructions provide you with information on caring for yourself after your procedure. Your caregiver may also give you more specific instructions. Your treatment has been planned according to current medical practices, but problems sometimes occur. Call your caregiver if you have any problems or questions after your procedure. HOME CARE INSTRUCTIONS  You may shower the day after the procedure.Remove the bandage (dressing) and gently wash the site with plain soap and water.Gently pat the site dry.   Do not apply powder or lotion to the site.   Do not submerge the affected site in water for 3 to 5 days.   Inspect the site at least twice daily.   Do not flex or bend the affected arm for 24 hours.   No lifting over 5 pounds (2.3 kg) for 5 days after your procedure.   Do not drive home if you are discharged the same day of the procedure. Have someone else drive you.   You may drive 24 hours after the procedure unless otherwise instructed by your caregiver.  What to expect:  Any bruising will usually fade within 1 to 2 weeks.   Blood that collects in the tissue (hematoma) may be painful to the touch. It should usually decrease in size and tenderness within 1 to 2 weeks.  SEEK IMMEDIATE MEDICAL CARE IF:  You have unusual pain at the radial site.   You have redness, warmth, swelling, or pain at the radial site.   You have drainage (other than a small amount of blood on the dressing).   You have chills.   You have a fever or persistent symptoms for more than 72 hours.   You have a fever and your symptoms suddenly get worse.   Your arm becomes pale, cool, tingly, or numb.   You have heavy bleeding from the site. Hold pressure on the site.      Aspirin and Your Heart  Aspirin is a medicine that affects the way blood  clots. Aspirin can be used to help reduce the risk of blood clots, heart attacks, and other heart-related problems.  SHOULD I TAKE ASPIRIN? Your health care provider will help you determine whether it is safe and beneficial for you to take aspirin daily. Taking aspirin daily may be beneficial if you:  Have had a heart attack or chest pain.  Have undergone open heart surgery such as coronary artery bypass surgery (CABG).  Have had coronary angioplasty.  Have experienced a stroke or transient ischemic attack (TIA).  Have peripheral vascular disease (PVD).  Have chronic heart rhythm problems such as atrial fibrillation. ARE THERE ANY RISKS OF TAKING ASPIRIN DAILY? Daily use of aspirin can increase your risk of side effects. Some of these include:  Bleeding. Bleeding problems can be minor or serious. An example of a minor problem is a cut that does not stop bleeding. An example of a more serious problem is stomach bleeding or bleeding into the brain. Your risk of bleeding is increased if you are also taking non-steroidal anti-inflammatory medicine (NSAIDs).  Increased bruising.  Upset stomach.  An allergic reaction. People who have nasal polyps have an increased risk of developing an aspirin allergy. WHAT ARE SOME GUIDELINES I SHOULD FOLLOW WHEN TAKING ASPIRIN?   Take aspirin only as directed by your health care provider. Make sure you understand how much  you should take and what form you should take. The two forms of aspirin are:  Non-enteric-coated. This type of aspirin does not have a coating and is absorbed quickly. Non-enteric-coated aspirin is usually recommended for people with chest pain. This type of aspirin also comes in a chewable form.  Enteric-coated. This type of aspirin has a special coating that releases the medicine very slowly. Enteric-coated aspirin causes less stomach upset than non-enteric-coated aspirin. This type of aspirin should not be chewed or crushed.  Drink  alcohol in moderation. Drinking alcohol increases your risk of bleeding. WHEN SHOULD I SEEK MEDICAL CARE?   You have unusual bleeding or bruising.  You have stomach pain.  You have an allergic reaction. Symptoms of an allergic reaction include:  Hives.  Itchy skin.  Swelling of the lips, tongue, or face.  You have ringing in your ears. WHEN SHOULD I SEEK IMMEDIATE MEDICAL CARE?   Your bowel movements are bloody, dark red, or black in color.  You vomit or cough up blood.  You have blood in your urine.  You cough, wheeze, or feel short of breath. If you have any of the following symptoms, this is an emergency. Do not wait to see if the pain will go away. Get medical help at once. Call your local emergency services (911 in the [place] Do not drive yourself to the hospital.  You have severe chest pain, especially if the pain is crushing or pressure-like and spreads to the arms, back, neck, or jaw.  You have stroke-like symptoms, such as:   Loss of vision.   Difficulty talking.   Numbness or weakness on one side of your body.   Numbness or weakness in your arm or leg.   Not thinking clearly or feeling confused.    This information is not intended to replace advice given to you by your health care provider. Make sure you discuss any questions you have with your health care provider.   Document Released: 11/22/2008 Document Revised: 12/31/2014 Document Reviewed: 03/17/2014 Elsevier Interactive Patient Education [employer].

## 2016-08-30 NOTE — Telephone Encounter (Signed)
-----   Message from Blain Pais sent at 08/30/2016 10:11 AM EDT ----- Regarding: tcm/pj 9/15 2:00 Christell Faith, [place]

## 2016-08-30 NOTE — Telephone Encounter (Signed)
Attempted to contact pt regarding discharge from [facility] on 08/30/16. Left message asking pt to call back regarding discharge instructions and/or medications. Advised pt of appt w/ Christell Faith, PA on 09/07/16 @ 2:00 w/ [facility]. Asked pt to call back if unable to keep this appt.

## 2016-08-30 NOTE — Progress Notes (Signed)
Patient Name: Erika [name] Date of Encounter: 08/30/2016  Hospital Problem List     Principal Problem:   NSTEMI (non-ST elevated myocardial infarction) [facility]) Active Problems:   CAD in native artery   S/P CABG x 4   HLD (hyperlipidemia)   Essential hypertension, malignant   Diabetes ([place])   Carotid stenosis    Subjective   No chest/back pain or dyspnea.  Eager to go home.  Inpatient Medications    . aspirin  325 mg Oral Daily  . atorvastatin  80 mg Oral q1800  . carvedilol  25 mg Oral BID WC  . clonazePAM  0.5 mg Oral TID  . FLUoxetine  40 mg Oral Daily  . isosorbide mononitrate  30 mg Oral Daily  . lamoTRIgine  200 mg Oral Daily  . living well with diabetes book   Does not apply Once  . sodium chloride flush  3 mL Intravenous Q12H  . spironolactone  25 mg Oral QHS    Vital Signs    Vitals:   08/29/16 2340 08/30/16 0459 08/30/16 0751 08/30/16 0844  BP: (!) 148/82 (!) 162/82 (!) 147/69 132/69  Pulse: 70 73 74 83  Resp: 20  18   Temp: 98.3 F (36.8 C) 98.2 F (36.8 C) 98.1 F (36.7 C)   TempSrc: Oral Oral Oral   SpO2: 95% 95% 96%   Weight:  201 lb 1.6 oz (91.2 kg)    Height:        Intake/Output Summary (Last 24 hours) at 08/30/16 0922 Last data filed at 08/30/16 0845  Gross per 24 hour  Intake              807 ml  Output                0 ml  Net              807 ml   Filed Weights   08/28/16 1958 08/29/16 0057 08/30/16 0459  Weight: 203 lb (92.1 kg) 200 lb 8 oz (90.9 kg) 201 lb 1.6 oz (91.2 kg)    Physical Exam    GEN: Well nourished, well developed, in no acute distress.  HEENT: normal.  Neck: Supple, no JVD, carotid bruits, or masses. Cardiac: RRR, no murmurs, rubs, or gallops. No clubbing, cyanosis, edema.  Radials/DP/PT 2+ and equal bilaterally.  L wrist cath site w/o bleeding/bruit/hematoma. Respiratory:  Respirations regular and unlabored, clear to auscultation bilaterally. GI: Soft, nontender, nondistended, BS + x 4. MS: no  deformity or atrophy. Skin: warm and dry, no rash. Neuro:  Strength and sensation are intact. Psych: Normal affect.  Labs    CBC  Recent Labs  08/28/16 2031 08/30/16 0322  WBC 12.0* 9.2  HGB 11.5* 11.1*  HCT 36.7 36.7  MCV 83.4 85.2  PLT 450* [id number]   Basic Metabolic Panel  Recent Labs  08/28/16 2031 08/30/16 0322  NA 139 141  K 3.6 4.0  CL 105 104  CO2 27 30  GLUCOSE 127* 111*  BUN 8 7  CREATININE 0.68 0.67  CALCIUM 10.1 9.8   Cardiac Enzymes  Recent Labs  08/28/16 2238 08/29/16 0217 08/29/16 0423  TROPONINI 0.09* 0.14* 0.14*    Telemetry    rsr  Radiology    Dg Chest 2 View  Result Date: 08/28/2016 CLINICAL DATA:  Left chest pain and left jaw pain with nausea and vomiting for 2 days. Bypass surgery 1 month ago. EXAM: CHEST  2 VIEW COMPARISON:  08/15/2016  FINDINGS: Mild enlargement of the cardiopericardial silhouette, without edema. Prior CABG. No pleural effusion or airspace opacity. No additional significant findings. IMPRESSION: 1. Stable mild enlargement of the cardiopericardial silhouette, with prior CABG. No edema or other acute findings. Electronically Signed   By: Van Clines M.D.   On: 08/28/2016 21:46   Cardiac Catheterization 9.6.2017 Coronary Findings  Dominance: Right  Left Main  Ost LM to LM lesion, 30% stenosed.  Left Anterior Descending  Ost LAD lesion, 50% stenosed.  Prox LAD lesion, 95% stenosed.  Mid LAD to Dist LAD lesion, 70% stenosed.  First Diagonal Branch  Vessel is large in size.  Ost 1st Diag to 1st Diag lesion, 60% stenosed.  1st Diag lesion, 50% stenosed.  Left Circumflex  Ost Cx to Prox Cx lesion, 60% stenosed.  Prox Cx lesion, 90% stenosed.  Mid Cx lesion, 60% stenosed.  Right Coronary Artery  Mid RCA lesion, 100% stenosed.  Graft Angiography  Free Graft to 1st Diag  Seq SVG- om1 graft was visualized by angiography and is normal in caliber. The graft exhibits minimal luminal irregularities.  Free Graft to  1st 41  Seq SVG- graft was visualized by angiography and is normal in caliber and anatomically normal.  saphenous Graft to RPDA  SVG and is normal in caliber and anatomically normal.  Free LIMA Graft to Mid LAD  LIMA and is normal in caliber and anatomically normal.   Patient Summary     45 y/o ? with a h/o HTN, HL, CAD s/p CABG x 4 in 07/2016, Carotid dzs s/p R CEA in 07/2016, obesity, and tob abuse, who was admitted 9/5 with chest pain, hypertensive urgency, and elevated troponin.  Cath 9/6 revealed 4/4 patent grafts.  Assessment & Plan    1. NSTEMI/CAD:  S/p recent CABG x 4.  She had been doing well @ home but developed mid scapular back pain two nights prior to admission and again on 9/5, prompting presentation to the ED via EMS.  Pain resolved with sl ntg in about thirty minutes PTA.  Trop rose to 0.14, though trend was flat.  BP was markedly elevated in ED.  In setting of recent CABG, recurrent angina, and troponin elevation, she underwent cath on 9/6.  As suspected, 4/4 grafts were patent. Known native dzs, including moderate distal LAD dzs beyond LIMA insertion.  Suspect symptoms and trop primarily driven by HTN urgency and demand ischemia.  Continue med Rx inluding asa, statin,  blocker, and long-acting nitrate.   blocker dose titrated beginning this AM.  2.  Hypertensive Urgency:  BP 173/99 in ED.  She says that it has been better controlled since CABG.  130's to 160's since admission.   blocker increased to 25 bid starting today.  Cont spironolactone and nitrate.  Would add acei or arb next if pressure remains elevated.  Can readdress as outpt.  I will arrange for f/u within the next 2 wks.  3.  HL:  LDL 64 on 8/29.  Cont high potency statin therapy.  4.  Carotid Dzs:  S/p R CEA in August.  Cont asa/statin.  5.  Tob Abuse:  Still smoking.  Knows that she needs to quit.  Cessation advised.  6.  Morbid Obesity:  She plans to enroll in cardiac rehab.  7.  Dispo:  [facility] for d/c  today from cardiology perspective.  Signed, Murray Hodgkins NP  I have seen, examined and evaluated the patient this AM along with Mr. Sharolyn Douglas, NP-C.  After reviewing  all the available data and chart, we discussed the patients laboratory, study & physical findings as well as symptoms in detail. I agree with his findings, examination as well as impression recommendations as per our discussion.    She looks and feels fine today. No further chest pain. Cardiac catheterization films reviewed yesterday with Dr. Fletcher Anon. I agree that her symptoms are probably related to hypertensive heart disease and had accelerated hypertension. Beta blocker dose was increased. We've also started low-dose Imdur.  She has close follow-up scheduled for next Friday with Christell Faith, PA as well as maintaining her October visit with Dr. Fletcher Anon.    Glenetta Hew, M.D., M.S. Interventional Cardiologist   Pager # [phone] Phone # [phone] [address] [place], [place] [address]

## 2016-08-30 NOTE — Progress Notes (Signed)
Patient being discharged home per MD order. All instructions given to patient and her husband. All discharge instructions given in written to patient at [place].

## 2016-08-30 NOTE — Telephone Encounter (Signed)
Patient contacted regarding discharge from [facility] on 08/30/16.  Patient understands to follow up with Christell Faith, PA on 09/07/16 at 10:00 at [facility]. Patient understands discharge instructions? yes Patient understands medications and regiment? yes Patient understands to bring all medications to this visit? yes

## 2016-08-30 NOTE — Discharge Summary (Signed)
Discharge Summary  Erika [name] [id number] DOB: [birth year]  PCP: Arnette Norris, MD  Admit date: 08/28/2016 Discharge date: 08/30/2016  Time spent: <104mins  Recommendations for Outpatient Follow-up:  1. F/u with PMD within a week  for hospital discharge follow up, repeat cbc/bmp at follow up, pmd to continue to work with patient for blood sugar control 2. F/u with cardiology  Discharge Diagnoses:  Active Hospital Problems   Diagnosis Date Noted  . NSTEMI (non-ST elevated myocardial infarction) ([place]) 06/27/2016  . S/P CABG x 4 07/06/2016  . Carotid stenosis   . CAD in native artery 06/29/2016  . Essential hypertension, malignant 06/28/2016  . Diabetes ([place])   . HLD (hyperlipidemia) 07/26/2010    Resolved Hospital Problems   Diagnosis Date Noted Date Resolved  No resolved problems to display.    Discharge Condition: stable  Diet recommendation: heart healthy/carb modified  Filed Weights   08/28/16 1958 08/29/16 0057 08/30/16 0459  Weight: 92.1 kg (203 lb) 90.9 kg (200 lb 8 oz) 91.2 kg (201 lb 1.6 oz)    History of present illness:  HPI: Erika [name] is a 45 y.o. female with medical history significant of CAD s/p 4 vessel CABG and R CEA performed less than 2 months ago following NSTEMI, malignant HTN, DM on metformin, ? RAS (shows up on [name], but not on renal artery cath).  Supposed to go to cardiac rehab next month.  Patient presents to ED with chest pain: 30 mins duration of chest pain that onset earlier today.  CP completely resolved with NTG, was associated with high BP at home (took BP at home and was [id number] systolic).  Had associated diaphoresis, dizziness, nausea, SOB with episode, vomiting.  ED Course: EKG shows chronic ischemic changes, they do not think there is true anterior wall STEMI.  Hospital Course:  Principal Problem:   NSTEMI (non-ST elevated myocardial infarction) ([place]) Active Problems:   HLD (hyperlipidemia)   Essential hypertension,  malignant   Diabetes ([place])   CAD in native artery   Carotid stenosis   S/P CABG x 4   Chest pain with CAD s/p 4 vessel CABG and R CEA performed less than 2 months ago following NSTEMI,  Troponin mildly elevated during this hospitalization, ekg with diffuse st/t flattening and inversion, she is started on heparin drip, asa, statin, coreg, cardiology consulted, cardiac cath with patent grafts. Mild troponin elevation thought due to uncontrolled blood pressure, she is discharged on iimdur which is now and increased dose coreg. She is continued on asa/statin. She is to close follow up with cardiology.  CAD s/p CABG as above  Carotid stenosis: s/p CEA  HTN: continue adjust bp meds  noninsulin dependent dm2, metformin held in the hospital, most recent a1c 10.4, advised patient to discuss initiating insulin with her primary care physician.  Smoker: smoking cessation education provided.   Rash: denies recent new meds, on dorsal hand only, continue to monitor  Body mass index is 31.4 kg/m. Patient report she has been working on weight loss and she has lost about 40pounds already, patient is encouraged to continue to loose weight.    DVT prophylaxis while in the hospital:Lovenox Code Status:Full Family Communication:patient and husband at bedside Consults called:Cards   Disposition Plan: home on 9/7   Consultants:  cardiology  Procedures:  Cardiac cath on 9/6  Antibiotics:  none   Discharge Exam: BP 132/69   Pulse 83   Temp 98.1 F (36.7 C) (Oral)   Resp 18  Ht 5\' 7"  (1.702 m)   Wt 91.2 kg (201 lb 1.6 oz)   LMP 08/21/2016   SpO2 96%   BMI 31.50 kg/m     General:  NAD  Cardiovascular: RRR  Respiratory: CTABL  Abdomen: Soft/ND/NT, positive BS  Musculoskeletal: No Edema, rash on hand has resolved.  Neuro: aaox3   Discharge Instructions You were cared for by a hospitalist during your hospital stay. If you have any questions about your  discharge medications or the care you received while you were in the hospital after you are discharged, you can call the unit and asked to speak with the hospitalist on call if the hospitalist that took care of you is not available. Once you are discharged, your primary care physician will handle any further medical issues. Please note that NO REFILLS for any discharge medications will be authorized once you are discharged, as it is imperative that you return to your primary care physician (or establish a relationship with a primary care physician if you do not have one) for your aftercare needs so that they can reassess your need for medications and monitor your lab values.  Discharge Instructions    Diet - low sodium heart healthy    Complete by:  As directed   Low fat and carb modified.   Increase activity slowly    Complete by:  As directed       Medication List    TAKE these medications   aspirin 325 MG EC tablet Take 1 tablet (325 mg total) by mouth daily.   atorvastatin 80 MG tablet Commonly known as:  LIPITOR Take 1 tablet (80 mg total) by mouth daily at 6 PM.   carvedilol 25 MG tablet Commonly known as:  COREG Take 1 tablet (25 mg total) by mouth 2 (two) times daily. What changed:  medication strength  how much to take   clonazePAM 0.5 MG tablet Commonly known as:  KLONOPIN Take 1 tablet (0.5 mg total) by mouth 3 (three) times daily.   FLUoxetine 40 MG capsule Commonly known as:  PROZAC Take 1 capsule (40 mg total) by mouth daily.   HYDROcodone-acetaminophen 5-325 MG tablet Commonly known as:  NORCO/VICODIN Take 1 tablet by mouth every 4 (four) hours as needed (for cluster headaches).   isosorbide mononitrate 30 MG 24 hr tablet Commonly known as:  IMDUR Take 1 tablet (30 mg total) by mouth daily.   lamoTRIgine 200 MG tablet Commonly known as:  LAMICTAL Take 1 tablet (200 mg total) by mouth daily.   metFORMIN 500 MG tablet Commonly known as:  GLUCOPHAGE Take  1 tablet (500 mg total) by mouth daily with breakfast.   nitroGLYCERIN 0.4 MG SL tablet Commonly known as:  NITROSTAT Place 1 tablet (0.4 mg total) under the tongue every 5 (five) minutes as needed for chest pain.   spironolactone 25 MG tablet Commonly known as:  ALDACTONE Take 1 tablet (25 mg total) by mouth daily. What changed:  when to take this      Allergies  Allergen Reactions  . No Known Allergies    Follow-up Information    Christell Faith, PA-C Follow up on 09/07/2016.   Specialties:  Physician Assistant, Cardiology, Radiology Why:  2:00 PM Contact information: [address] [place] [address] [phone]        [name], MD Follow up on 09/28/2016.   Specialty:  Cardiology Why:  2:00 PM Contact information: [place], [place] Follow up in 1 week(s).   Specialty:  Family Medicine Why:  hospital discharge follow up, continue work on diabetes control and blood pressure control, please discuss with your primary care doctor about initiating insulin for better blood sugar control. Contact information: [place] [address] [phone]            The results of significant diagnostics from this hospitalization (including imaging, microbiology, ancillary and laboratory) are listed below for reference.    Significant Diagnostic Studies: Dg Chest 2 View  Result Date: 08/28/2016 CLINICAL DATA:  Left chest pain and left jaw pain with nausea and vomiting for 2 days. Bypass surgery 1 month ago. EXAM: CHEST  2 VIEW COMPARISON:  08/15/2016 FINDINGS: Mild enlargement of the cardiopericardial silhouette, without edema. Prior CABG. No pleural effusion or airspace opacity. No additional significant findings. IMPRESSION: 1. Stable mild enlargement of the cardiopericardial silhouette, with prior CABG. No edema or other acute findings. Electronically Signed   By: Van Clines M.D.   On: 08/28/2016 21:46   Dg Chest 2 View  Result Date: 08/15/2016 CLINICAL DATA:  Status post CABG on July 14 27. The patient reports chest tenderness but no other chest complaints. EXAM: CHEST  2 VIEW COMPARISON:  PA and lateral chest x-ray of July 09, 2016 FINDINGS: The lungs are adequately inflated. The pleural effusions have resolved. Bibasilar atelectasis has also nearly totally cleared. The heart is mildly enlarged. The pulmonary vascularity is normal. The sternal wires are intact. No acute abnormality of the bony thorax is observed. IMPRESSION: Near total clearing of bibasilar atelectasis. Interval resolution of pleural effusions. No evidence of CHF. Electronically Signed   By: David  Martinique M.D.   On: 08/15/2016 12:13    Microbiology: Recent Results (from the past 240 hour(s))  MRSA PCR Screening     Status: None   Collection Time: 08/29/16 12:45 AM  Result Value Ref Range Status   MRSA by PCR NEGATIVE NEGATIVE Final    Comment:        The GeneXpert MRSA Assay (FDA approved for NASAL specimens only), is one component of a comprehensive MRSA colonization surveillance program. It is not intended to diagnose MRSA infection nor to guide or monitor treatment for MRSA infections.      Labs: Basic Metabolic Panel:  Recent Labs Lab 08/28/16 2031 08/30/16 0322  NA 139 141  K 3.6 4.0  CL 105 104  CO2 27 30  GLUCOSE 127* 111*  BUN 8 7  CREATININE 0.68 0.67  CALCIUM 10.1 9.8   Liver Function Tests: No results for input(s): AST, ALT, ALKPHOS, BILITOT, PROT, ALBUMIN in the last 168 hours. No results for input(s): LIPASE, AMYLASE in the last 168 hours. No results for input(s): AMMONIA in the last 168 hours. CBC:  Recent Labs Lab 08/28/16 2031 08/30/16 0322  WBC 12.0* 9.2  HGB 11.5* 11.1*  HCT 36.7 36.7  MCV 83.4 85.2  PLT 450* 374   Cardiac Enzymes:  Recent Labs Lab 08/28/16 2238 08/29/16 0217 08/29/16 0423  TROPONINI 0.09* 0.14* 0.14*    BNP: BNP (last 3 results)  Recent Labs  06/30/16 0345  BNP 120.7*    ProBNP (last 3 results) No results for input(s): PROBNP in the last 8760 hours.  CBG:  Recent Labs Lab 08/29/16 1808  GLUCAP 120*       Signed:  Shyheim Tanney MD, PhD  [facility] 08/30/2016, 11:31 AM

## 2016-08-31 ENCOUNTER — Other Ambulatory Visit: Payer: Self-pay | Admitting: Family Medicine

## 2016-08-31 LAB — HEMOGLOBIN A1C
HEMOGLOBIN A1C: 6.2 % — AB (ref 4.8–5.6)
MEAN PLASMA GLUCOSE: 131 mg/dL

## 2016-08-31 MED FILL — Verapamil HCl IV Soln 2.5 MG/ML: INTRAVENOUS | Qty: 2 | Status: AC

## 2016-09-03 ENCOUNTER — Encounter: Payer: Self-pay | Admitting: Family Medicine

## 2016-09-03 ENCOUNTER — Ambulatory Visit (INDEPENDENT_AMBULATORY_CARE_PROVIDER_SITE_OTHER): Payer: BLUE CROSS/BLUE SHIELD | Admitting: Family Medicine

## 2016-09-03 VITALS — BP 142/80 | HR 66 | Temp 98.0°F | Wt 204.2 lb

## 2016-09-03 DIAGNOSIS — I10 Essential (primary) hypertension: Secondary | ICD-10-CM

## 2016-09-03 DIAGNOSIS — R079 Chest pain, unspecified: Secondary | ICD-10-CM | POA: Diagnosis not present

## 2016-09-03 DIAGNOSIS — E1169 Type 2 diabetes mellitus with other specified complication: Secondary | ICD-10-CM | POA: Diagnosis not present

## 2016-09-03 DIAGNOSIS — Z23 Encounter for immunization: Secondary | ICD-10-CM

## 2016-09-03 DIAGNOSIS — I119 Hypertensive heart disease without heart failure: Secondary | ICD-10-CM

## 2016-09-03 DIAGNOSIS — I25110 Atherosclerotic heart disease of native coronary artery with unstable angina pectoris: Secondary | ICD-10-CM

## 2016-09-03 LAB — CBC WITH DIFFERENTIAL/PLATELET
BASOS ABS: 0.1 10*3/uL (ref 0.0–0.1)
Basophils Relative: 0.5 % (ref 0.0–3.0)
EOS ABS: 0.3 10*3/uL (ref 0.0–0.7)
Eosinophils Relative: 2.3 % (ref 0.0–5.0)
HCT: 38.6 % (ref 36.0–46.0)
HEMOGLOBIN: 12.4 g/dL (ref 12.0–15.0)
LYMPHS ABS: 3.6 10*3/uL (ref 0.7–4.0)
Lymphocytes Relative: 30.7 % (ref 12.0–46.0)
MCHC: 32.1 g/dL (ref 30.0–36.0)
MCV: 81.5 fl (ref 78.0–100.0)
MONO ABS: 0.4 10*3/uL (ref 0.1–1.0)
Monocytes Relative: 3.5 % (ref 3.0–12.0)
NEUTROS PCT: 63 % (ref 43.0–77.0)
Neutro Abs: 7.3 10*3/uL (ref 1.4–7.7)
Platelets: 517 10*3/uL — ABNORMAL HIGH (ref 150.0–400.0)
RBC: 4.73 Mil/uL (ref 3.87–5.11)
RDW: 15.6 % — ABNORMAL HIGH (ref 11.5–15.5)
WBC: 11.6 10*3/uL — AB (ref 4.0–10.5)

## 2016-09-03 LAB — COMPREHENSIVE METABOLIC PANEL
ALBUMIN: 4.4 g/dL (ref 3.5–5.2)
ALK PHOS: 87 U/L (ref 39–117)
ALT: 38 U/L — AB (ref 0–35)
AST: 13 U/L (ref 0–37)
BILIRUBIN TOTAL: 0.2 mg/dL (ref 0.2–1.2)
BUN: 12 mg/dL (ref 6–23)
CO2: 30 mEq/L (ref 19–32)
CREATININE: 0.74 mg/dL (ref 0.40–1.20)
Calcium: 10.2 mg/dL (ref 8.4–10.5)
Chloride: 101 mEq/L (ref 96–112)
GFR: 91.15 mL/min (ref >60.00)
GLUCOSE: 104 mg/dL — AB (ref 70–99)
POTASSIUM: 4.3 meq/L (ref 3.5–5.1)
SODIUM: 137 meq/L (ref 135–145)
TOTAL PROTEIN: 7.8 g/dL (ref 6.0–8.3)

## 2016-09-03 MED ORDER — HYDROCODONE-ACETAMINOPHEN 5-325 MG PO TABS: 1.0000 | ORAL_TABLET | ORAL | 0 refills | Status: DC | PRN

## 2016-09-03 NOTE — Assessment & Plan Note (Signed)
Resolved. Has follow up with cardiology. Continue med management.

## 2016-09-03 NOTE — Progress Notes (Signed)
Pre visit review using our clinic review tool, if applicable. No additional management support is needed unless otherwise documented below in the visit note. 

## 2016-09-03 NOTE — Progress Notes (Signed)
Subjective:   Patient ID: Erika [name], female    DOB: 1971/08/10, 45 y.o.   MRN: [id number]  Erika [name] is a pleasant 45 y.o. year old female with h/o CAD s/p NSTEMI, CABG and R CEA almost two months ago, who presents to clinic today with Hospitalization Follow-up  on 09/03/2016  HPI:  Admitted to [facility] 08/28/16- 08/30/16. Notes reviewed.  Presented to ED with chest pain which resolved with NTG. She did have associated n/v, SOB and diaphoresis.  Troponin was mildly elevated, EKG showed chronic ST changes. Was started on heparin drip, ASA, coreg.  Cardiology consulted- cardiac cath with patent grafts. Felt mild troponin elevation was due to uncontrolled blood pressure. Discharged on Imdur, coreg increased, and other rxs continued. Has follow up scheduled with cardiology on Friday. Has had no further CP since discharge.  DM- Metformin held in the hospital which she has resumed.  Lab Results  Component Value Date   CHOL 121 08/21/2016   HDL 24 (L) 08/21/2016   LDLCALC 64 08/21/2016   LDLDIRECT 177.1 04/22/2012   TRIG 164 (H) 08/21/2016   CHOLHDL 5.0 (H) 08/21/2016    Lab Results  Component Value Date   HGBA1C 6.2 (H) 08/30/2016   Dg Chest 2 View  Result Date: 08/28/2016 CLINICAL DATA:  Left chest pain and left jaw pain with nausea and vomiting for 2 days. Bypass surgery 1 month ago. EXAM: CHEST  2 VIEW COMPARISON:  08/15/2016 FINDINGS: Mild enlargement of the cardiopericardial silhouette, without edema. Prior CABG. No pleural effusion or airspace opacity. No additional significant findings. IMPRESSION: 1. Stable mild enlargement of the cardiopericardial silhouette, with prior CABG. No edema or other acute findings. Electronically Signed   By: Van Clines M.D.   On: 08/28/2016 21:46   Dg Chest 2 View  Result Date: 08/15/2016 CLINICAL DATA:  Status post CABG on July 14 27. The patient reports chest tenderness but no other chest complaints. EXAM: CHEST  2 VIEW  COMPARISON:  PA and lateral chest x-ray of July 09, 2016 FINDINGS: The lungs are adequately inflated. The pleural effusions have resolved. Bibasilar atelectasis has also nearly totally cleared. The heart is mildly enlarged. The pulmonary vascularity is normal. The sternal wires are intact. No acute abnormality of the bony thorax is observed. IMPRESSION: Near total clearing of bibasilar atelectasis. Interval resolution of pleural effusions. No evidence of CHF. Electronically Signed   By: David  Martinique M.D.   On: 08/15/2016 12:13    Lab Results  Component Value Date   CREATININE 0.67 08/30/2016   Lab Results  Component Value Date   WBC 9.2 08/30/2016   HGB 11.1 (L) 08/30/2016   HCT 36.7 08/30/2016   MCV 85.2 08/30/2016   PLT 374 08/30/2016   Lab Results  Component Value Date   NA 141 08/30/2016   K 4.0 08/30/2016   CL 104 08/30/2016   CO2 30 08/30/2016   Current Outpatient Prescriptions on File Prior to Visit  Medication Sig Dispense Refill  . aspirin EC 325 MG EC tablet Take 1 tablet (325 mg total) by mouth daily. 30 tablet 0  . atorvastatin (LIPITOR) 80 MG tablet Take 1 tablet (80 mg total) by mouth daily at 6 PM. 30 tablet 1  . carvedilol (COREG) 25 MG tablet Take 1 tablet (25 mg total) by mouth 2 (two) times daily. 60 tablet 6  . clonazePAM (KLONOPIN) 0.5 MG tablet Take 1 tablet (0.5 mg total) by mouth 3 (three) times daily. 90 tablet 2  .  FLUoxetine (PROZAC) 40 MG capsule Take 1 capsule (40 mg total) by mouth daily. 30 capsule 2  . HYDROcodone-acetaminophen (NORCO/VICODIN) 5-325 MG tablet Take 1 tablet by mouth every 4 (four) hours as needed (for cluster headaches).   0  . isosorbide mononitrate (IMDUR) 30 MG 24 hr tablet Take 1 tablet (30 mg total) by mouth daily. 30 tablet 6  . lamoTRIgine (LAMICTAL) 200 MG tablet Take 1 tablet (200 mg total) by mouth daily. 30 tablet 2  . metFORMIN (GLUCOPHAGE) 500 MG tablet Take 1 tablet (500 mg total) by mouth daily with breakfast.    .  metFORMIN (GLUCOPHAGE) 500 MG tablet TAKE ONE TABLET BY MOUTH ONCE DAILY WITH BREAKFAST 30 tablet 3  . nitroGLYCERIN (NITROSTAT) 0.4 MG SL tablet Place 1 tablet (0.4 mg total) under the tongue every 5 (five) minutes as needed for chest pain. 30 tablet 0  . spironolactone (ALDACTONE) 25 MG tablet Take 1 tablet (25 mg total) by mouth daily. (Patient taking differently: Take 25 mg by mouth at bedtime. ) 30 tablet 3   No current facility-administered medications on file prior to visit.     Allergies  Allergen Reactions  . No Known Allergies     Past Medical History:  Diagnosis Date  . Arthralgia of temporomandibular joint   . CAD, multiple vessel    a. cath 06/29/16: ostLM 40%, ostLAD 40%, pLAD 95%, ost-pLCx 60%, pLCx 95%, mLCx 60%, mRCA 95%, D2 50%, LVSF nl;  b. 07/2016 CABG x 4 (LIMA->LAD, VG->Diag, VG->OM, VG->RCA).  . Carotid arterial disease ([place])    a. 07/2016 s/p R CEA.  . Depression   . Diastolic dysfunction    a.e cho 06/28/16: EF 50-55%, mild inf wall HK, GR1DD, mild MR, RV sys fxn nl, mildly dilated LA, PASP nl  . Fatty liver disease, nonalcoholic Q000111Q  . HLD (hyperlipidemia)   . Malignant hypertension   . Obesity   . PTSD (post-traumatic stress disorder)   . Tobacco abuse   . Type 2 diabetes mellitus [facility]) January 2017    Past Surgical History:  Procedure Laterality Date  . CARDIAC CATHETERIZATION [name] 06/29/2016   Procedure: Left Heart Cath and Coronary Angiography;  Surgeon: Minna Merritts, MD;  Location: [place] CV LAB;  Service: Cardiovascular;  Laterality: [name];  . CARDIAC CATHETERIZATION [name] 08/29/2016   Procedure: Left Heart Cath and Cors/Grafts Angiography;  Surgeon: Wellington Hampshire, MD;  Location: [place] CV LAB;  Service: Cardiovascular;  Laterality: [name];  . CESAREAN SECTION    . CHOLECYSTECTOMY    . CORONARY ARTERY BYPASS GRAFT [name] 07/06/2016   Procedure: CORONARY ARTERY BYPASS GRAFTING (CABG) x four, using left internal mammary artery and right leg greater  saphenous vein harvested endoscopically;  Surgeon: Ivin Poot, MD;  Location: [place];  Service: Open Heart Surgery;  Laterality: [name];  . ENDARTERECTOMY Right 07/06/2016   Procedure: ENDARTERECTOMY CAROTID;  Surgeon: Rosetta Posner, MD;  Location: [place];  Service: Vascular;  Laterality: Right;  . PERIPHERAL VASCULAR CATHETERIZATION [name] 04/18/2016   Procedure: Renal Angiography;  Surgeon: Wellington Hampshire, MD;  Location: [place] CV LAB;  Service: Cardiovascular;  Laterality: [name];  . TEE WITHOUT CARDIOVERSION [name] 07/06/2016   Procedure: TRANSESOPHAGEAL ECHOCARDIOGRAM (TEE);  Surgeon: Ivin Poot, MD;  Location: [place];  Service: Open Heart Surgery;  Laterality: [name];  . TONSILLECTOMY      Family History  Problem Relation Age of Onset  . Adopted: Yes  . Diabetes Mother   .  Diabetes Father   . Alcohol abuse Father   . Heart disease Father   . Drug abuse Father   . Stroke Sister   . Anxiety disorder Sister     Social History   Social History  . Marital status: Married    Spouse name: [name]  . Number of children: [name]  . Years of education: [name]   Occupational History  . Not on file.   Social History Main Topics  . Smoking status: Former Smoker    Packs/day: 0.50    Types: Cigarettes    Quit date: 06/28/2016  . Smokeless tobacco: Never Used  . Alcohol use No  . Drug use: No  . Sexual activity: Yes    Birth control/ protection: None   Other Topics Concern  . Not on file   Social History Narrative  . No narrative on file   The PMH, PSH, Social History, Family History, Medications, and allergies have been reviewed in [facility], and have been updated if relevant.   Review of Systems  Constitutional: Negative.   HENT: Negative.   Respiratory: Negative.   Cardiovascular: Negative.   Gastrointestinal: Negative.   Musculoskeletal: Negative.   Neurological: Negative.   Psychiatric/Behavioral: Negative.   All other systems reviewed and are negative.      Objective:    BP  (!) 142/80   Pulse 66   Temp 98 F (36.7 C) (Oral)   Wt 204 lb 4 oz (92.6 kg)   LMP 08/21/2016   SpO2 97%   BMI 31.99 kg/m    Physical Exam  Constitutional: She is oriented to person, place, and time. She appears well-developed and well-nourished. No distress.  HENT:  Head: Normocephalic.  Eyes: Conjunctivae are normal.  Cardiovascular: Normal rate and regular rhythm.   Pulmonary/Chest: Effort normal and breath sounds normal.  Musculoskeletal: Normal range of motion.  Neurological: She is alert and oriented to person, place, and time. No cranial nerve deficit.  Skin: Skin is warm. She is not diaphoretic.  Psychiatric: She has a normal mood and affect. Her behavior is normal. Judgment and thought content normal.  Nursing note and vitals reviewed.         Assessment & Plan:   Coronary artery disease involving native coronary artery of native heart with unstable angina pectoris (HCC)  Chest pain with high risk for cardiac etiology  Type 2 diabetes mellitus with other specified complication, without long-term current use of insulin (HCC)  Essential hypertension, malignant No Follow-up on file.

## 2016-09-03 NOTE — Assessment & Plan Note (Signed)
Improved. Recheck labs today.

## 2016-09-06 NOTE — Progress Notes (Signed)
Cardiology Office Note Date:  09/07/2016  Patient ID:  Erika [name], Erika [name] 11/04/1971, MRN [id number] PCP:  Arnette Norris, MD  Cardiologist:  Dr. Fletcher Anon, MD    Chief Complaint: Hospital follow up  History of Present Illness: Erika [name] is a 45 y.o. female with history of CAD s/p 4 vessel CABG 07/06/2016, carotid artery disease s/p right-sided CEA on 07/06/2016, HTN, DM, and tobacco abuse who presents for hospital follow up after recent admission to [facility] 9/5-9/7 for chest pain. She recently underwent coronary artery bypass grafting x 4 (LIMA-LAD, SVG-Diag, SVG-OM, SVG-RPDA) on 07/06/2016 coupled with right-sided CEA. She reported retrosternal chest pain at rest on 9/4 that was nonradiating prompting her to come to the hospital. Her systolic BP was 123456 following her episode of chest pain. EMS verified this reading upon their arrival. Troponin peaked at 0.14. She underwent LHC on 08/29/16 that showed severe 3-vessel disease with patent grafts including SVG-RPDA, sequential vein graft to D1/OM1, and LIMA to LAD. The LIMA made an unusual turn distally before the anastomosis with diffuse disease in the LAD distal anastomosis. The vessel was overall small in caliber with the dominant vessel being the large diagonal branch. Normal LV systolic function and mildly elevated LVEDP. Continued aggressive medical management was advised and it was felt her symptoms were likely 2/2 uncontrolled hypertension. BP improved at discharge to Q000111Q mm Hg systolic.  She is doing well today. She has not had any further episodes of chest pain or elevated blood pressures. BP at home has been running in the 123456 systolic range. She is tolerating all of her medications without issues and has not missed any doses. She has not yet taken her BP medications this morning 2/2 they make her sleepy and she did not want to drive on them. She continues to smoke 2 cigarettes daily at this time and is working on quitting. She has tried  Chantix before and did not tolerate it. She is currently using nicotine patches and would like to continue to do so. She is starting cardiac rehab the following week. She does not have any concerns this morning.     Past Medical History:  Diagnosis Date  . Arthralgia of temporomandibular joint   . CAD, multiple vessel    a. cath 06/29/16: ostLM 40%, ostLAD 40%, pLAD 95%, ost-pLCx 60%, pLCx 95%, mLCx 60%, mRCA 95%, D2 50%, LVSF nl;  b. 07/2016 CABG x 4 (LIMA->LAD, VG->Diag, VG->OM, VG->RCA).  . Carotid arterial disease ([place])    a. 07/2016 s/p R CEA.  . Depression   . Diastolic dysfunction    a.e cho 06/28/16: EF 50-55%, mild inf wall HK, GR1DD, mild MR, RV sys fxn nl, mildly dilated LA, PASP nl  . Fatty liver disease, nonalcoholic Q000111Q  . HLD (hyperlipidemia)   . Malignant hypertension   . Obesity   . PTSD (post-traumatic stress disorder)   . Tobacco abuse   . Type 2 diabetes mellitus [facility]) January 2017    Past Surgical History:  Procedure Laterality Date  . CARDIAC CATHETERIZATION [name] 06/29/2016   Procedure: Left Heart Cath and Coronary Angiography;  Surgeon: Minna Merritts, MD;  Location: [place] CV LAB;  Service: Cardiovascular;  Laterality: [name];  . CARDIAC CATHETERIZATION [name] 08/29/2016   Procedure: Left Heart Cath and Cors/Grafts Angiography;  Surgeon: Wellington Hampshire, MD;  Location: [place] CV LAB;  Service: Cardiovascular;  Laterality: [name];  . CESAREAN SECTION    . CHOLECYSTECTOMY    . CORONARY ARTERY  BYPASS GRAFT [name] 07/06/2016   Procedure: CORONARY ARTERY BYPASS GRAFTING (CABG) x four, using left internal mammary artery and right leg greater saphenous vein harvested endoscopically;  Surgeon: Ivin Poot, MD;  Location: [place];  Service: Open Heart Surgery;  Laterality: [name];  . ENDARTERECTOMY Right 07/06/2016   Procedure: ENDARTERECTOMY CAROTID;  Surgeon: Rosetta Posner, MD;  Location: [place];  Service: Vascular;  Laterality: Right;  . PERIPHERAL VASCULAR CATHETERIZATION [name]  04/18/2016   Procedure: Renal Angiography;  Surgeon: Wellington Hampshire, MD;  Location: [place] CV LAB;  Service: Cardiovascular;  Laterality: [name];  . TEE WITHOUT CARDIOVERSION [name] 07/06/2016   Procedure: TRANSESOPHAGEAL ECHOCARDIOGRAM (TEE);  Surgeon: Ivin Poot, MD;  Location: [place];  Service: Open Heart Surgery;  Laterality: [name];  . TONSILLECTOMY      Current Outpatient Prescriptions  Medication Sig Dispense Refill  . aspirin EC 325 MG EC tablet Take 1 tablet (325 mg total) by mouth daily. 30 tablet 0  . atorvastatin (LIPITOR) 80 MG tablet Take 1 tablet (80 mg total) by mouth daily at 6 PM. 30 tablet 1  . carvedilol (COREG) 25 MG tablet Take 1 tablet (25 mg total) by mouth 2 (two) times daily. 60 tablet 6  . clonazePAM (KLONOPIN) 0.5 MG tablet Take 1 tablet (0.5 mg total) by mouth 3 (three) times daily. 90 tablet 2  . FLUoxetine (PROZAC) 40 MG capsule Take 1 capsule (40 mg total) by mouth daily. 30 capsule 2  . HYDROcodone-acetaminophen (NORCO/VICODIN) 5-325 MG tablet Take 1 tablet by mouth every 4 (four) hours as needed (for cluster headaches). 30 tablet 0  . isosorbide mononitrate (IMDUR) 30 MG 24 hr tablet Take 1 tablet (30 mg total) by mouth daily. 30 tablet 6  . lamoTRIgine (LAMICTAL) 200 MG tablet Take 1 tablet (200 mg total) by mouth daily. 30 tablet 2  . metFORMIN (GLUCOPHAGE) 500 MG tablet Take 1 tablet (500 mg total) by mouth daily with breakfast.    . nitroGLYCERIN (NITROSTAT) 0.4 MG SL tablet Place 1 tablet (0.4 mg total) under the tongue every 5 (five) minutes as needed for chest pain. 30 tablet 0  . spironolactone (ALDACTONE) 25 MG tablet Take 1 tablet (25 mg total) by mouth daily. (Patient taking differently: Take 25 mg by mouth at bedtime. ) 30 tablet 3   No current facility-administered medications for this visit.     Allergies:   No known allergies   Social History:  The patient  reports that she quit smoking about 2 months ago. Her smoking use included Cigarettes.  She smoked 0.50 packs per day. She has never used smokeless tobacco. She reports that she does not drink alcohol or use drugs.   Family History:  The patient's family history includes Alcohol abuse in her father; Anxiety disorder in her sister; Diabetes in her father and mother; Drug abuse in her father; Heart disease in her father; Stroke in her sister. She was adopted.  ROS:   Review of Systems  Constitutional: Negative for chills, diaphoresis, fever, malaise/fatigue and weight loss.  HENT: Negative for congestion.   Eyes: Negative for discharge and redness.  Respiratory: Negative for cough, sputum production, shortness of breath and wheezing.   Cardiovascular: Negative for chest pain, palpitations, orthopnea, claudication, leg swelling and PND.  Gastrointestinal: Negative for abdominal pain, heartburn, nausea and vomiting.  Musculoskeletal: Negative for falls and myalgias.  Skin: Negative for rash.  Neurological: Negative for dizziness, tingling, tremors, sensory change, speech change, focal weakness, loss of  consciousness and weakness.  Endo/Heme/Allergies: Does not bruise/bleed easily.  Psychiatric/Behavioral: Negative for substance abuse. The patient is not nervous/anxious.   All other systems reviewed and are negative.    PHYSICAL EXAM:  VS:  BP 140/82 (BP Location: Left Arm, Patient Position: Sitting, Cuff Size: Normal)   Pulse 63   Ht 5\' 7"  (1.702 m)   Wt 202 lb 4 oz (91.7 kg)   LMP 08/21/2016   BMI 31.68 kg/m  BMI: Body mass index is 31.68 kg/m.  Physical Exam  Constitutional: She is oriented to person, place, and time. She appears well-developed and well-nourished.  HENT:  Head: Normocephalic and atraumatic.  Eyes: Right eye exhibits no discharge. Left eye exhibits no discharge.  Neck: Normal range of motion. No JVD present.  Cardiovascular: Normal rate, regular rhythm, S1 normal, S2 normal and normal heart sounds.  Exam reveals no distant heart sounds, no friction rub,  no midsystolic click and no opening snap.   No murmur heard. Cath site well healed without bleeding, bruising, swelling, erythema, or TTP. No bruit.   Pulmonary/Chest: Effort normal and breath sounds normal. No respiratory distress. She has no decreased breath sounds. She has no wheezes. She has no rales. She exhibits no tenderness.  Abdominal: Soft. She exhibits no distension. There is no tenderness.  Musculoskeletal: She exhibits no edema.  Neurological: She is alert and oriented to person, place, and time.  Skin: Skin is warm and dry. No cyanosis. Nails show no clubbing.  Psychiatric: She has a normal mood and affect. Her speech is normal and behavior is normal. Judgment and thought content normal.     EKG:  Was ordered and interpreted by me today. Shows NSR, 64 bpm, TWI leads I, aVL, V3-V6  Recent Labs: 06/30/2016: B Natriuretic Peptide 120.7; TSH 1.485 07/07/2016: Magnesium 2.2 09/03/2016: ALT 38; BUN 12; Creatinine, Ser 0.74; Hemoglobin 12.4; Platelets 517.0; Potassium 4.3; Sodium 137  06/28/2016: VLDL UNABLE TO CALCULATE IF TRIGLYCERIDE OVER 400 mg/dL 08/21/2016: Chol/HDL Ratio 5.0; Cholesterol, Total 121; HDL 24; LDL Calculated 64; Triglycerides 164   Estimated Creatinine Clearance: 106.4 mL/min (by C-G formula based on SCr of 0.74 mg/dL).   Wt Readings from Last 3 Encounters:  09/07/16 202 lb 4 oz (91.7 kg)  09/03/16 204 lb 4 oz (92.6 kg)  08/30/16 201 lb 1.6 oz (91.2 kg)     Other studies reviewed: Additional studies/records reviewed today include: summarized above  ASSESSMENT AND PLAN:  1. CAD s/p CABG as above: No further chest pain or symptoms concerning for angina. Continue current aggressive medical management with ASA/Lipitor/Coreg/Imdur/spironolactone. No plans for further ischemic evaluation at this time. Ok to start cardiac rehab the following week.   2. HTN: Much improved with readings in the 123456 systolic range at home. She has not yet taken  Her medications today.  She will take them upon arriving home after this appointment. Should her BP become elevated again she will let us know. Continue current medications.    3. Carotid artery disease: S/p CEA in 06/2016. Continue ASA and statin.   4. Tobacco abuse: Cessation advised. Declines Chantix. Wants to use nicotine patches only.   5. HLD: Lipitor 80 mg daily. Recent lipid panel as above 07/2016.  6. Cardiac rehab:  Plans to enroll in cardiac rehab.   Disposition: F/u with Dr. Fletcher Anon, MD in 5-6 weeks.   Current medicines are reviewed at length with the patient today.  The patient did not have any concerns regarding medicines.  Signed, Christell Faith PA-C  09/07/2016 9:53 AM     [address] [place],  [id number] [phone]

## 2016-09-07 ENCOUNTER — Encounter: Payer: Self-pay | Admitting: Physician Assistant

## 2016-09-07 ENCOUNTER — Ambulatory Visit (INDEPENDENT_AMBULATORY_CARE_PROVIDER_SITE_OTHER): Payer: BLUE CROSS/BLUE SHIELD | Admitting: Physician Assistant

## 2016-09-07 VITALS — BP 140/82 | HR 63 | Ht 67.0 in | Wt 202.2 lb

## 2016-09-07 DIAGNOSIS — I25110 Atherosclerotic heart disease of native coronary artery with unstable angina pectoris: Secondary | ICD-10-CM | POA: Diagnosis not present

## 2016-09-07 DIAGNOSIS — Z720 Tobacco use: Secondary | ICD-10-CM

## 2016-09-07 DIAGNOSIS — I10 Essential (primary) hypertension: Secondary | ICD-10-CM

## 2016-09-07 DIAGNOSIS — E785 Hyperlipidemia, unspecified: Secondary | ICD-10-CM

## 2016-09-07 DIAGNOSIS — I6523 Occlusion and stenosis of bilateral carotid arteries: Secondary | ICD-10-CM

## 2016-09-07 DIAGNOSIS — Z951 Presence of aortocoronary bypass graft: Secondary | ICD-10-CM

## 2016-09-07 NOTE — Patient Instructions (Addendum)
Medication Instructions: - Your physician recommends that you continue on your current medications as directed. Please refer to the Current Medication list given to you today.  Labwork: - none ordered  Procedures/Testing: - none ordered  Follow-Up: Your physician recommends that you schedule a follow-up appointment in: late October with Dr. Fletcher Anon.  Any Additional Special Instructions Will Be Listed Below (If Applicable).     If you need a refill on your cardiac medications before your next appointment, please call your pharmacy.

## 2016-09-11 ENCOUNTER — Encounter (HOSPITAL_COMMUNITY): Payer: BLUE CROSS/BLUE SHIELD

## 2016-09-13 ENCOUNTER — Other Ambulatory Visit (HOSPITAL_COMMUNITY): Payer: Self-pay

## 2016-09-13 ENCOUNTER — Encounter (HOSPITAL_COMMUNITY)
Admission: RE | Admit: 2016-09-13 | Discharge: 2016-09-13 | Disposition: A | Discharge status: Home / Self Care | Payer: BLUE CROSS/BLUE SHIELD | Source: Ambulatory Visit | Attending: Cardiovascular Disease | Admitting: Cardiovascular Disease

## 2016-09-13 VITALS — BP 152/96 | HR 86 | Resp 97 | Ht 67.0 in | Wt 202.4 lb

## 2016-09-13 DIAGNOSIS — I214 Non-ST elevation (NSTEMI) myocardial infarction: Secondary | ICD-10-CM

## 2016-09-13 DIAGNOSIS — Z951 Presence of aortocoronary bypass graft: Secondary | ICD-10-CM | POA: Diagnosis not present

## 2016-09-13 DIAGNOSIS — F331 Major depressive disorder, recurrent, moderate: Secondary | ICD-10-CM

## 2016-09-13 MED ORDER — LAMOTRIGINE 200 MG PO TABS: 200.0000 mg | ORAL_TABLET | Freq: Every day | ORAL | 1 refills | Status: DC

## 2016-09-13 MED ORDER — CLONAZEPAM 0.5 MG PO TABS: 0.5000 mg | ORAL_TABLET | Freq: Three times a day (TID) | ORAL | 0 refills | Status: DC

## 2016-09-13 MED ORDER — FLUOXETINE HCL 40 MG PO CAPS: 40.0000 mg | ORAL_CAPSULE | Freq: Every day | ORAL | 1 refills | Status: DC

## 2016-09-13 NOTE — Progress Notes (Signed)
Cardiac/Pulmonary Rehab Medication Review by a Pharmacist  Does the patient  feel that his/her medications are working for him/her?  yes  Has the patient been experiencing any side effects to the medications prescribed?  no  Does the patient measure his/her own blood pressure or blood glucose at home?  yes   Does the patient have any problems obtaining medications due to transportation or finances?   no  Understanding of regimen: excellent Understanding of indications: excellent Potential of compliance: excellent  Questions asked to Determine Patient Understanding of Medication Regimen:  1. What is the name of the medication?  2. What is the medication used for?  3. When should it be taken?  4. How much should be taken?  5. How will you take it?  6. What side effects should you report?  Understanding Defined as: Excellent: All questions above are correct Good: Questions 1-4 are correct Fair: Questions 1-2 are correct  Poor: 1 or none of the above questions are correct   Pharmacist comments: Pt does not c/o any side effects.  States the Isosorbide initially made her sleepy but that has resolved.  Pt does check BP and blood sugar at home.  Med list is up to date.    Hart Robinsons A 09/13/2016 1:26 PM

## 2016-09-13 NOTE — Progress Notes (Signed)
Cardiac Individual Treatment Plan  Patient Details  Name: Erika [name] MRN: [id number] Date of Birth: 27-Feb-1971 Referring Provider:   Flowsheet Row CARDIAC REHAB PHASE II ORIENTATION from 09/13/2016 in [place]  Referring Provider  Dr. Fletcher Anon      Initial Encounter Date:  Flowsheet Row CARDIAC REHAB PHASE II ORIENTATION from 09/13/2016 in [place]  Date  09/13/16  Referring Provider  Dr. Fletcher Anon      Visit Diagnosis: S/P CABG x 4  NSTEMI (non-ST elevated myocardial infarction) [facility])  Patient's Home Medications on Admission:  Current Outpatient Prescriptions:  .  aspirin EC 325 MG EC tablet, Take 1 tablet (325 mg total) by mouth daily., Disp: 30 tablet, Rfl: 0 .  atorvastatin (LIPITOR) 80 MG tablet, Take 1 tablet (80 mg total) by mouth daily at 6 PM., Disp: 30 tablet, Rfl: 1 .  carvedilol (COREG) 25 MG tablet, Take 1 tablet (25 mg total) by mouth 2 (two) times daily., Disp: 60 tablet, Rfl: 6 .  clonazePAM (KLONOPIN) 0.5 MG tablet, Take 1 tablet (0.5 mg total) by mouth 3 (three) times daily., Disp: 90 tablet, Rfl: 0 .  FLUoxetine (PROZAC) 40 MG capsule, Take 1 capsule (40 mg total) by mouth daily., Disp: 30 capsule, Rfl: 1 .  HYDROcodone-acetaminophen (NORCO/VICODIN) 5-325 MG tablet, Take 1 tablet by mouth every 4 (four) hours as needed (for cluster headaches)., Disp: 30 tablet, Rfl: 0 .  isosorbide mononitrate (IMDUR) 30 MG 24 hr tablet, Take 1 tablet (30 mg total) by mouth daily., Disp: 30 tablet, Rfl: 6 .  lamoTRIgine (LAMICTAL) 200 MG tablet, Take 1 tablet (200 mg total) by mouth daily., Disp: 30 tablet, Rfl: 1 .  metFORMIN (GLUCOPHAGE) 500 MG tablet, Take 1 tablet (500 mg total) by mouth daily with breakfast., Disp: , Rfl:  .  nitroGLYCERIN (NITROSTAT) 0.4 MG SL tablet, Place 1 tablet (0.4 mg total) under the tongue every 5 (five) minutes as needed for chest pain., Disp: 30 tablet, Rfl: 0 .  spironolactone (ALDACTONE) 25 MG  tablet, Take 1 tablet (25 mg total) by mouth daily. (Patient taking differently: Take 25 mg by mouth at bedtime. ), Disp: 30 tablet, Rfl: 3  Past Medical History: Past Medical History:  Diagnosis Date  . Arthralgia of temporomandibular joint   . CAD, multiple vessel    a. cath 06/29/16: ostLM 40%, ostLAD 40%, pLAD 95%, ost-pLCx 60%, pLCx 95%, mLCx 60%, mRCA 95%, D2 50%, LVSF nl;  b. 07/2016 CABG x 4 (LIMA->LAD, VG->Diag, VG->OM, VG->RCA).  . Carotid arterial disease ([place])    a. 07/2016 s/p R CEA.  . Depression   . Diastolic dysfunction    a.e cho 06/28/16: EF 50-55%, mild inf wall HK, GR1DD, mild MR, RV sys fxn nl, mildly dilated LA, PASP nl  . Fatty liver disease, nonalcoholic [id number]  . HLD (hyperlipidemia)   . Malignant hypertension   . Obesity   . PTSD (post-traumatic stress disorder)   . Tobacco abuse   . Type 2 diabetes mellitus [facility]) January 2017    Tobacco Use: History  Smoking Status  . Former Smoker  . Packs/day: 0.50  . Types: Cigarettes  . Quit date: 06/28/2016  Smokeless Tobacco  . Never Used    Labs: Recent Review Flowsheet Data    Labs for ITP Cardiac and Pulmonary Rehab Latest Ref Rng & Units 07/06/2016 07/07/2016 07/07/2016 08/21/2016 08/30/2016   Cholestrol 100 - 199 mg/dL - - - 121 -   LDLCALC 0 - 99 mg/dL - - -  64 -   LDLDIRECT mg/dL - - - - -   HDL >39 mg/dL - - - 24(L) -   Trlycerides 0 - 149 mg/dL - - - 164(H) -   Hemoglobin A1c 4.8 - 5.6 % - - - - 6.2(H)   PHART 7.350 - 7.450 - 7.376 - - -   PCO2ART 35.0 - 45.0 mmHg - 43.9 - - -   HCO3 20.0 - 24.0 mEq/L - 25.6(H) - - -   TCO2 0 - 100 mmol/L 26 27 27  - -   ACIDBASEDEF 0.0 - 2.0 mmol/L - - - - -   O2SAT % - 92.0 - - -      Capillary Blood Glucose: Lab Results  Component Value Date   GLUCAP 120 (H) 08/29/2016   GLUCAP 123 (H) 07/13/2016   GLUCAP 148 (H) 07/12/2016   GLUCAP 127 (H) 07/12/2016   GLUCAP 147 (H) 07/12/2016     Exercise Target Goals: Date: 09/13/16  Exercise Program Goal: Individual  exercise prescription set with THRR, safety & activity barriers. Participant demonstrates ability to understand and report RPE using BORG scale, to self-measure pulse accurately, and to acknowledge the importance of the exercise prescription.  Exercise Prescription Goal: Starting with aerobic activity 30 plus minutes a day, 3 days per week for initial exercise prescription. Provide home exercise prescription and guidelines that participant acknowledges understanding prior to discharge.  Activity Barriers & Risk Stratification:     Activity Barriers & Cardiac Risk Stratification - 09/13/16 1358      Activity Barriers & Cardiac Risk Stratification   Activity Barriers None  Her anxiety medication if taken before coming to class will make her foggy and drowsy.   Cardiac Risk Stratification High      6 Minute Walk:     6 Minute Walk    Row Name 09/13/16 1437         6 Minute Walk   Phase Initial     Distance 1000 feet     Walk Time 6 minutes     # of Rest Breaks 0     MPH 1.89     METS 2.45     RPE 15     Perceived Dyspnea  13     VO2 Peak 13.86     Symptoms No     Resting HR 86 bpm     Resting BP 152/96     Max Ex. HR 107 bpm     Max Ex. BP 160/108     2 Minute Post BP 160/90        Initial Exercise Prescription:     Initial Exercise Prescription - 09/13/16 1400      Date of Initial Exercise RX and Referring Provider   Date 09/13/16   Referring Provider Dr. Fletcher Anon     Treadmill   MPH 1.3   Grade 0   Minutes 20   METs 1.9     NuStep   Level 2   Watts 15   Minutes 15   METs 1.9     Prescription Details   Frequency (times per week) 3   Duration Progress to 30 minutes of continuous aerobic without signs/symptoms of physical distress     Intensity   THRR REST +  30   THRR 40-80% of Max Heartrate 122-141-160   Ratings of Perceived Exertion 11-13   Perceived Dyspnea 0-4     Progression   Progression Continue progressive overload as per policy without  signs/symptoms  or physical distress.     Resistance Training   Training Prescription Yes   Weight 1   Reps 10-12      Perform Capillary Blood Glucose checks as needed.  Exercise Prescription Changes:   Exercise Comments:    Discharge Exercise Prescription (Final Exercise Prescription Changes):   Nutrition:  Target Goals: Understanding of nutrition guidelines, daily intake of sodium 1500mg , cholesterol 200mg , calories 30% from fat and 7% or less from saturated fats, daily to have 5 or more servings of fruits and vegetables.  Biometrics:     Pre Biometrics - 09/13/16 1439      Pre Biometrics   Height 5\' 7"  (1.702 m)   Weight 202 lb 6.1 oz (91.8 kg)   Waist Circumference 41 inches   Hip Circumference 44 inches   Waist to Hip Ratio 0.93 %   BMI (Calculated) 31.8   Triceps Skinfold 16 mm   % Body Fat 37.8 %   Grip Strength 64 kg   Flexibility 11.5 in   Single Leg Stand 10 seconds       Nutrition Therapy Plan and Nutrition Goals:   Nutrition Discharge: Rate Your Plate Scores:     Nutrition Assessments - 09/13/16 1402      MEDFICTS Scores   Pre Score 36      Nutrition Goals Re-Evaluation:   Psychosocial: Target Goals: Acknowledge presence or absence of depression, maximize coping skills, provide positive support system. Participant is able to verbalize types and ability to use techniques and skills needed for reducing stress and depression.  Initial Review & Psychosocial Screening:     Initial Psych Review & Screening - 09/13/16 1439      Initial Review   Current issues with Current Depression;History of Depression;Current Anxiety/Panic;Current Stress Concerns  PTSD   Source of Stress Concerns Chronic Illness   Comments Patient is currently in counseling for her depression.      Family Dynamics   Good Support System? Yes     Barriers   Psychosocial barriers to participate in program The patient should benefit from training in stress management  and relaxation.;Psychosocial barriers identified (see note)  Patient QOL score 12.63 which is low and show depression. Patient is oan medication for depression and currently in counseling.      Screening Interventions   Interventions Encouraged to exercise      Quality of Life Scores:     Quality of Life - 09/13/16 1443      Quality of Life Scores   Health/Function Pre 10.2 %   Socioeconomic Pre 10.06 %   Psych/Spiritual Pre 10.93 %   Family Pre 26.4 %   GLOBAL Pre 12.63 %      PHQ-9: Recent Review Flowsheet Data    Depression screen [facility] 2/9 09/13/2016   Decreased Interest 1   Down, Depressed, Hopeless 3   PHQ - 2 Score 4   Altered sleeping 3   Tired, decreased energy 3   Change in appetite 3   Feeling bad or failure about yourself  3   Trouble concentrating 3   Moving slowly or fidgety/restless 3   Suicidal thoughts 1   PHQ-9 Score 23   Difficult doing work/chores Extremely dIfficult      Psychosocial Evaluation and Intervention:     Psychosocial Evaluation - 09/13/16 1441      Psychosocial Evaluation & Interventions   Interventions Stress management education;Relaxation education;Encouraged to exercise with the program and follow exercise prescription   Comments Already in counseling.  Continued Psychosocial Services Needed Yes      Psychosocial Re-Evaluation:   Vocational Rehabilitation: Provide vocational rehab assistance to qualifying candidates.   Vocational Rehab Evaluation & Intervention:     Vocational Rehab - 09/13/16 1400      Initial Vocational Rehab Evaluation & Intervention   Assessment shows need for Vocational Rehabilitation No      Education: Education Goals: Education classes will be provided on a weekly basis, covering required topics. Participant will state understanding/return demonstration of topics presented.  Learning Barriers/Preferences:     Learning Barriers/Preferences - 09/13/16 1359      Learning  Barriers/Preferences   Learning Barriers None   Learning Preferences Verbal Instruction;Skilled Demonstration      Education Topics: Hypertension, Hypertension Reduction -Define heart disease and high blood pressure. Discus how high blood pressure affects the body and ways to reduce high blood pressure.   Exercise and Your Heart -Discuss why it is important to exercise, the FITT principles of exercise, normal and abnormal responses to exercise, and how to exercise safely.   Angina -Discuss definition of angina, causes of angina, treatment of angina, and how to decrease risk of having angina.   Cardiac Medications -Review what the following cardiac medications are used for, how they affect the body, and side effects that may occur when taking the medications.  Medications include Aspirin, Beta blockers, calcium channel blockers, ACE Inhibitors, angiotensin receptor blockers, diuretics, digoxin, and antihyperlipidemics.   Congestive Heart Failure -Discuss the definition of CHF, how to live with CHF, the signs and symptoms of CHF, and how keep track of weight and sodium intake.   Heart Disease and Intimacy -Discus the effect sexual activity has on the heart, how changes occur during intimacy as we age, and safety during sexual activity.   Smoking Cessation / COPD -Discuss different methods to quit smoking, the health benefits of quitting smoking, and the definition of COPD.   Nutrition I: Fats -Discuss the types of cholesterol, what cholesterol does to the heart, and how cholesterol levels can be controlled.   Nutrition II: Labels -Discuss the different components of food labels and how to read food label   Heart Parts and Heart Disease -Discuss the anatomy of the heart, the pathway of blood circulation through the heart, and these are affected by heart disease.   Stress I: Signs and Symptoms -Discuss the causes of stress, how stress may lead to anxiety and depression,  and ways to limit stress.   Stress II: Relaxation -Discuss different types of relaxation techniques to limit stress.   Warning Signs of Stroke / TIA -Discuss definition of a stroke, what the signs and symptoms are of a stroke, and how to identify when someone is having stroke.   Knowledge Questionnaire Score:     Knowledge Questionnaire Score - 09/13/16 1359      Knowledge Questionnaire Score   Pre Score 23/28      Core Components/Risk Factors/Patient Goals at Admission:     Personal Goals and Risk Factors at Admission - 09/13/16 1428      Core Components/Risk Factors/Patient Goals on Admission    Weight Management Weight Maintenance   Increase Strength and Stamina Yes   Intervention Provide advice, education, support and counseling about physical activity/exercise needs.;Develop an individualized exercise prescription for aerobic and resistive training based on initial evaluation findings, risk stratification, comorbidities and participant's personal goals.   Expected Outcomes Achievement of increased cardiorespiratory fitness and enhanced flexibility, muscular endurance and strength shown through measurements of  functional capacity and personal statement of participant.   Tobacco Cessation Yes   Intervention Assist the participant in steps to quit. Provide individualized education and counseling about committing to Tobacco Cessation, relapse prevention, and pharmacological support that can be provided by physician.;Advice worker, assist with locating and accessing local/national Quit Smoking programs, and support quit date choice.   Expected Outcomes Long Term: Complete abstinence from all tobacco products for at least 12 months from quit date.;Short Term: Will demonstrate readiness to quit, by selecting a quit date.   Diabetes Yes   Intervention Provide education about signs/symptoms and action to take for hypo/hyperglycemia.   Expected Outcomes Long Term:  Attainment of HbA1C < 7%.;Short Term: Participant verbalizes understanding of the signs/symptoms and immediate care of hyper/hypoglycemia, proper foot care and importance of medication, aerobic/resistive exercise and nutrition plan for blood glucose control.   Hypertension Yes   Intervention Monitor prescription use compliance.;Provide education on lifestyle modifcations including regular physical activity/exercise, weight management, moderate sodium restriction and increased consumption of fresh fruit, vegetables, and low fat dairy, alcohol moderation, and smoking cessation.   Expected Outcomes Short Term: Continued assessment and intervention until BP is < 140/43mm HG in hypertensive participants. < 130/10mm HG in hypertensive participants with diabetes, heart failure or chronic kidney disease.;Long Term: Maintenance of blood pressure at goal levels.   Personal Goal Other Yes   Personal Goal Get back to regular intimacy with husband, get back to normal ADL's   Intervention Attend CR 3 x week and supplement with 2 x week at home.    Expected Outcomes To reach persoanl goals.       Core Components/Risk Factors/Patient Goals Review:      Goals and Risk Factor Review    Row Name 09/13/16 1438             Core Components/Risk Factors/Patient Goals Review   Personal Goals Review Tobacco Cessation;Increase Strength and Stamina;Hypertension          Core Components/Risk Factors/Patient Goals at Discharge (Final Review):      Goals and Risk Factor Review - 09/13/16 1438      Core Components/Risk Factors/Patient Goals Review   Personal Goals Review Tobacco Cessation;Increase Strength and Stamina;Hypertension      ITP Comments:   Comments: Patient arrived for 1st visit/orientation/education at 1230. Patient was referred to CR by Dr. Gwenlyn Found, however her cardiologist is Dr. Fletcher Anon. She was referred to CR due to CABGx4 (Z95.1). During orientation advised patient on arrival and appointment  times what to wear, what to do before, during and after exercise. Reviewed attendance and class policy. Talked about inclement weather and class consultation policy. Pt is scheduled to return Cardiac Rehab on 09/17/16 at 0930. Pt was advised to come to class 15 minutes before class starts. Patient was also given instructions on meeting with the dietician and attending the Family Structure classes. Pt is eager to get started. Patient was able to complete 6 minute walk test. Patient was measured for the equipment. Discussed equipment safety with patient. Took patient pre-anthropometric measurements. Patient finished visit at 1415.

## 2016-09-17 ENCOUNTER — Encounter (HOSPITAL_COMMUNITY)
Admission: RE | Admit: 2016-09-17 | Discharge: 2016-09-17 | Disposition: A | Discharge status: Home / Self Care | Payer: BLUE CROSS/BLUE SHIELD | Source: Ambulatory Visit | Attending: Cardiovascular Disease | Admitting: Cardiovascular Disease

## 2016-09-17 DIAGNOSIS — Z951 Presence of aortocoronary bypass graft: Secondary | ICD-10-CM

## 2016-09-17 NOTE — Progress Notes (Signed)
Daily Session Note  Patient Details  Name: YER CASTELLO MRN: [id number] Date of Birth: Jan 14, 1971 Referring Provider:   Flowsheet Row CARDIAC REHAB PHASE II ORIENTATION from 09/13/2016 in [place]  Referring Provider  Dr. Fletcher Anon      Encounter Date: 09/17/2016  Check In:     Session Check In - 09/17/16 0930      Check-In   Location [facility] & Pul[facility] Re[facility]   Staff Present Russella Dar, MS, EP, [facility], Exercise Physiologist;Briella Hobday Luther Parody, BS, EP, Exercise Physiologist   Supervising physician immediately available to respond to emergencies See telemetry face sheet for immediately available MD   Medication changes reported     No   Fall or balance concerns reported    No   Warm-up and Cool-down Performed as group-led instruction   Resistance Training Performed Yes   VAD Patient? No     Pain Assessment   Currently in Pain? No/denies   Pain Score 0-No pain   Multiple Pain Sites No      Capillary Blood Glucose: No results found for this or any previous visit (from the past 24 hour(s)).   Goals Met:  Independence with exercise equipment Exercise tolerated well No report of cardiac concerns or symptoms Strength training completed today  Goals Unmet:  Not Applicable  Comments: Check out 1030   Dr. Kate Sable is Medical Director for [place] and Pulmonary Rehab.

## 2016-09-18 ENCOUNTER — Ambulatory Visit (HOSPITAL_COMMUNITY): Payer: Self-pay | Admitting: Psychiatry

## 2016-09-19 ENCOUNTER — Encounter (HOSPITAL_COMMUNITY)
Admission: RE | Admit: 2016-09-19 | Discharge: 2016-09-19 | Disposition: A | Discharge status: Home / Self Care | Payer: BLUE CROSS/BLUE SHIELD | Source: Ambulatory Visit | Attending: Cardiovascular Disease | Admitting: Cardiovascular Disease

## 2016-09-19 DIAGNOSIS — Z951 Presence of aortocoronary bypass graft: Secondary | ICD-10-CM | POA: Diagnosis not present

## 2016-09-21 ENCOUNTER — Encounter (HOSPITAL_COMMUNITY)
Admission: RE | Admit: 2016-09-21 | Discharge: 2016-09-21 | Disposition: A | Discharge status: Home / Self Care | Payer: BLUE CROSS/BLUE SHIELD | Source: Ambulatory Visit | Attending: Cardiovascular Disease | Admitting: Cardiovascular Disease

## 2016-09-21 DIAGNOSIS — Z951 Presence of aortocoronary bypass graft: Secondary | ICD-10-CM | POA: Diagnosis not present

## 2016-09-21 NOTE — Progress Notes (Signed)
Daily Session Note  Patient Details  Name: Erika [name] MRN: [id number] Date of Birth: 1971/11/04 Referring Provider:   Flowsheet Row CARDIAC REHAB PHASE II ORIENTATION from 09/13/2016 in [place]  Referring Provider  Dr. Fletcher Anon      Encounter Date: 09/21/2016  Check In:     Session Check In - 09/21/16 0930      Check-In   Location [facility]   Staff Present Russella Dar, MS, EP, [name], Exercise Physiologist;Malik Paar Luther Parody, BS, EP, Exercise Physiologist   Supervising physician immediately available to respond to emergencies See telemetry face sheet for immediately available MD   Medication changes reported     No   Fall or balance concerns reported    No   Warm-up and Cool-down Performed as group-led instruction   Resistance Training Performed Yes   VAD Patient? No     Pain Assessment   Currently in Pain? No/denies   Pain Score 0-No pain   Multiple Pain Sites No      Capillary Blood Glucose: No results found for this or any previous visit (from the past 24 hour(s)).   Goals Met:  Independence with exercise equipment Exercise tolerated well No report of cardiac concerns or symptoms Strength training completed today  Goals Unmet:  Not Applicable  Comments: Check out 1030   Dr. Kate Sable is Medical Director for [place] and Pulmonary Rehab.

## 2016-09-24 ENCOUNTER — Encounter (HOSPITAL_COMMUNITY)
Admission: RE | Admit: 2016-09-24 | Discharge: 2016-09-24 | Disposition: A | Discharge status: Home / Self Care | Payer: BLUE CROSS/BLUE SHIELD | Source: Ambulatory Visit | Attending: Cardiovascular Disease | Admitting: Cardiovascular Disease

## 2016-09-24 DIAGNOSIS — Z951 Presence of aortocoronary bypass graft: Secondary | ICD-10-CM | POA: Insufficient documentation

## 2016-09-24 DIAGNOSIS — I214 Non-ST elevation (NSTEMI) myocardial infarction: Secondary | ICD-10-CM

## 2016-09-24 NOTE — Progress Notes (Signed)
Daily Session Note  Patient Details  Name: MEKHIA BROGAN MRN: [id number] Date of Birth: [birth year] Referring Provider:   Flowsheet Row CARDIAC REHAB PHASE II ORIENTATION from 09/13/2016 in [place]  Referring Provider  Dr. Fletcher Anon      Encounter Date: 09/24/2016  Check In:     Session Check In - 09/24/16 0930      Check-In   Location [facility]   Staff Present Russella Dar, MS, EP, [facility], Exercise Physiologist;Hiroyuki Ozanich Wynetta Emery, RN, BSN   Supervising physician immediately available to respond to emergencies See telemetry face sheet for immediately available MD   Medication changes reported     No   Fall or balance concerns reported    No   Warm-up and Cool-down Performed as group-led instruction   Resistance Training Performed Yes   VAD Patient? No     Pain Assessment   Currently in Pain? No/denies   Pain Score 0-No pain   Multiple Pain Sites No      Capillary Blood Glucose: No results found for this or any previous visit (from the past 24 hour(s)).   Goals Met:  Independence with exercise equipment Exercise tolerated well No report of cardiac concerns or symptoms Strength training completed today  Goals Unmet:  Not Applicable  Comments: Check out 10:30.   Dr. Kate Sable is Medical Director for [facility] Cardiac and Pulmonary Rehab.

## 2016-09-26 ENCOUNTER — Encounter (HOSPITAL_COMMUNITY): Admission: RE | Admit: 2016-09-26 | Payer: BLUE CROSS/BLUE SHIELD | Source: Ambulatory Visit

## 2016-09-26 DIAGNOSIS — M9905 Segmental and somatic dysfunction of pelvic region: Secondary | ICD-10-CM | POA: Diagnosis not present

## 2016-09-26 DIAGNOSIS — M9903 Segmental and somatic dysfunction of lumbar region: Secondary | ICD-10-CM | POA: Diagnosis not present

## 2016-09-26 DIAGNOSIS — M9902 Segmental and somatic dysfunction of thoracic region: Secondary | ICD-10-CM | POA: Diagnosis not present

## 2016-09-26 DIAGNOSIS — M545 Low back pain: Secondary | ICD-10-CM | POA: Diagnosis not present

## 2016-09-27 DIAGNOSIS — M545 Low back pain: Secondary | ICD-10-CM | POA: Diagnosis not present

## 2016-09-27 DIAGNOSIS — M9902 Segmental and somatic dysfunction of thoracic region: Secondary | ICD-10-CM | POA: Diagnosis not present

## 2016-09-27 DIAGNOSIS — M9905 Segmental and somatic dysfunction of pelvic region: Secondary | ICD-10-CM | POA: Diagnosis not present

## 2016-09-27 DIAGNOSIS — M9903 Segmental and somatic dysfunction of lumbar region: Secondary | ICD-10-CM | POA: Diagnosis not present

## 2016-09-28 ENCOUNTER — Ambulatory Visit: Payer: Self-pay | Admitting: Cardiovascular Disease

## 2016-09-28 ENCOUNTER — Encounter (HOSPITAL_COMMUNITY)
Admission: RE | Admit: 2016-09-28 | Discharge: 2016-09-28 | Disposition: A | Discharge status: Home / Self Care | Payer: BLUE CROSS/BLUE SHIELD | Source: Ambulatory Visit | Attending: Cardiovascular Disease | Admitting: Cardiovascular Disease

## 2016-09-28 DIAGNOSIS — I214 Non-ST elevation (NSTEMI) myocardial infarction: Secondary | ICD-10-CM

## 2016-09-28 DIAGNOSIS — Z951 Presence of aortocoronary bypass graft: Secondary | ICD-10-CM | POA: Diagnosis not present

## 2016-09-28 NOTE — Progress Notes (Signed)
Daily Session Note  Patient Details  Name: MANDOLIN FALWELL MRN: [id number] Date of Birth: 1971-10-30 Referring Provider:   Flowsheet Row CARDIAC REHAB PHASE II ORIENTATION from 09/13/2016 in [place]  Referring Provider  Dr. Fletcher Anon      Encounter Date: 09/28/2016  Check In:     Session Check In - 09/28/16 0930      Check-In   Location [facility] Pul[facility] Re[facility]   Staff Present Suzanne Boron, BS, EP, Exercise Physiologist;Kortnee Bas Wynetta Emery, RN, BSN   Supervising physician immediately available to respond to emergencies See telemetry face sheet for immediately available MD   Medication changes reported     No   Fall or balance concerns reported    No   Warm-up and Cool-down Performed as group-led instruction   Resistance Training Performed Yes   VAD Patient? No     Pain Assessment   Currently in Pain? No/denies   Pain Score 0-No pain   Multiple Pain Sites No      Capillary Blood Glucose: No results found for this or any previous visit (from the past 24 hour(s)).   Goals Met:  Independence with exercise equipment Exercise tolerated well No report of cardiac concerns or symptoms Strength training completed today  Goals Unmet:  Not Applicable  Comments: Check out 1030.   Dr. Kate Sable is Medical Director for [facility] Cardiac and Pulmonary Rehab.

## 2016-10-01 ENCOUNTER — Encounter (HOSPITAL_COMMUNITY)
Admission: RE | Admit: 2016-10-01 | Discharge: 2016-10-01 | Disposition: A | Discharge status: Home / Self Care | Payer: BLUE CROSS/BLUE SHIELD | Source: Ambulatory Visit | Attending: Cardiovascular Disease | Admitting: Cardiovascular Disease

## 2016-10-01 DIAGNOSIS — M9902 Segmental and somatic dysfunction of thoracic region: Secondary | ICD-10-CM | POA: Diagnosis not present

## 2016-10-01 DIAGNOSIS — M9905 Segmental and somatic dysfunction of pelvic region: Secondary | ICD-10-CM | POA: Diagnosis not present

## 2016-10-01 DIAGNOSIS — Z951 Presence of aortocoronary bypass graft: Secondary | ICD-10-CM

## 2016-10-01 DIAGNOSIS — M545 Low back pain: Secondary | ICD-10-CM | POA: Diagnosis not present

## 2016-10-01 DIAGNOSIS — M9903 Segmental and somatic dysfunction of lumbar region: Secondary | ICD-10-CM | POA: Diagnosis not present

## 2016-10-01 NOTE — Progress Notes (Signed)
Daily Session Note  Patient Details  Name: Erika [name] MRN: [id number] Date of Birth: [birth year] Referring Provider:   Flowsheet Row CARDIAC REHAB PHASE II ORIENTATION from 09/13/2016 in [place]  Referring Provider  Dr. Fletcher Anon      Encounter Date: 10/01/2016  Check In:     Session Check In - 10/01/16 0930      Check-In   Location [facility] Pul[facility] Re[facility]   Staff Present Russella Dar, MS, EP, [facility], Exercise Physiologist;Debra Wynetta Emery, RN, BSN   Supervising physician immediately available to respond to emergencies See telemetry face sheet for immediately available MD   Medication changes reported     No   Fall or balance concerns reported    No   Warm-up and Cool-down Performed as group-led instruction   Resistance Training Performed Yes   VAD Patient? No     Pain Assessment   Currently in Pain? No/denies   Pain Score 0-No pain   Multiple Pain Sites No      Capillary Blood Glucose: No results found for this or any previous visit (from the past 24 hour(s)).   Goals Met:  Independence with exercise equipment Exercise tolerated well No report of cardiac concerns or symptoms Strength training completed today  Goals Unmet:  Not Applicable  Comments: Check out 1030   Dr. Kate Sable is Medical Director for [place] and Pulmonary Rehab.

## 2016-10-02 ENCOUNTER — Ambulatory Visit (INDEPENDENT_AMBULATORY_CARE_PROVIDER_SITE_OTHER): Payer: BLUE CROSS/BLUE SHIELD | Admitting: Family Medicine

## 2016-10-02 ENCOUNTER — Encounter: Payer: Self-pay | Admitting: Family Medicine

## 2016-10-02 VITALS — BP 112/60 | HR 69 | Temp 97.9°F | Wt 205.5 lb

## 2016-10-02 DIAGNOSIS — I119 Hypertensive heart disease without heart failure: Secondary | ICD-10-CM | POA: Diagnosis not present

## 2016-10-02 DIAGNOSIS — E785 Hyperlipidemia, unspecified: Secondary | ICD-10-CM

## 2016-10-02 DIAGNOSIS — E1169 Type 2 diabetes mellitus with other specified complication: Secondary | ICD-10-CM

## 2016-10-02 DIAGNOSIS — Z951 Presence of aortocoronary bypass graft: Secondary | ICD-10-CM

## 2016-10-02 DIAGNOSIS — F331 Major depressive disorder, recurrent, moderate: Secondary | ICD-10-CM

## 2016-10-02 DIAGNOSIS — G44009 Cluster headache syndrome, unspecified, not intractable: Secondary | ICD-10-CM

## 2016-10-02 MED ORDER — HYDROCODONE-ACETAMINOPHEN 5-325 MG PO TABS: 1.0000 | ORAL_TABLET | ORAL | 0 refills | Status: DC | PRN

## 2016-10-02 MED ORDER — CLONAZEPAM 0.5 MG PO TABS: 0.5000 mg | ORAL_TABLET | Freq: Three times a day (TID) | ORAL | 0 refills | Status: DC

## 2016-10-02 NOTE — Assessment & Plan Note (Signed)
Good control. No changes made to rxs. 

## 2016-10-02 NOTE — Assessment & Plan Note (Signed)
Continue current plan for pain management. Norco rx printed and given to pt.

## 2016-10-02 NOTE — Assessment & Plan Note (Signed)
Followed by cardiology. No changes made to rxs. 

## 2016-10-02 NOTE — Progress Notes (Signed)
Subjective:   Patient ID: Erika [name], female    DOB: May 17, 1971, 45 y.o.   MRN: [id number]  Erika [name] is a pleasant 45 y.o. year old female with h/o CAD s/p NSTEMI, CABG and R CEA almost two months ago, who presents to clinic today with Follow-up  on 10/02/2016  HPI:  CAD- s/p CABG-Has been attending cardiac rehab.  Last saw Cardiology, Christell Faith on 09/07/16. Note reviewed.  The following dispo:  1. CAD s/p CABG as above: No further chest pain or symptoms concerning for angina. Continue current aggressive medical management with ASA/Lipitor/Coreg/Imdur/spironolactone. No plans for further ischemic evaluation at this time. Ok to start cardiac rehab the following week.   2. HTN: Much improved with readings in the 123456 systolic range at home. She has not yet taken  Her medications today. She will take them upon arriving home after this appointment. Should her BP become elevated again she will let us know. Continue current medications.    3. Carotid artery disease: S/p CEA in 06/2016. Continue ASA and statin.   4. Tobacco abuse: Cessation advised. Declines Chantix. Wants to use nicotine patches only.   5. HLD: Lipitor 80 mg daily. Recent lipid panel as above 07/2016.  6. Cardiac rehab:  Plans to enroll in cardiac rehab.    DM- improved control with Metformin.  Still need norco for headaches, especially now that she is taking daily NTG. Lab Results  Component Value Date   HGBA1C 6.2 (H) 08/30/2016    Lab Results  Component Value Date   CHOL 121 08/21/2016   HDL 24 (L) 08/21/2016   LDLCALC 64 08/21/2016   LDLDIRECT 177.1 04/22/2012   TRIG 164 (H) 08/21/2016   CHOLHDL 5.0 (H) 08/21/2016    Lab Results  Component Value Date   HGBA1C 6.2 (H) 08/30/2016   No results found.  Lab Results  Component Value Date   CREATININE 0.74 09/03/2016   Lab Results  Component Value Date   WBC 11.6 (H) 09/03/2016   HGB 12.4 09/03/2016   HCT 38.6 09/03/2016   MCV  81.5 09/03/2016   PLT 517.0 (H) 09/03/2016   Lab Results  Component Value Date   NA 137 09/03/2016   K 4.3 09/03/2016   CL 101 09/03/2016   CO2 30 09/03/2016   Current Outpatient Prescriptions on File Prior to Visit  Medication Sig Dispense Refill  . aspirin EC 325 MG EC tablet Take 1 tablet (325 mg total) by mouth daily. 30 tablet 0  . atorvastatin (LIPITOR) 80 MG tablet Take 1 tablet (80 mg total) by mouth daily at 6 PM. 30 tablet 1  . carvedilol (COREG) 25 MG tablet Take 1 tablet (25 mg total) by mouth 2 (two) times daily. 60 tablet 6  . clonazePAM (KLONOPIN) 0.5 MG tablet Take 1 tablet (0.5 mg total) by mouth 3 (three) times daily. 90 tablet 0  . FLUoxetine (PROZAC) 40 MG capsule Take 1 capsule (40 mg total) by mouth daily. 30 capsule 1  . HYDROcodone-acetaminophen (NORCO/VICODIN) 5-325 MG tablet Take 1 tablet by mouth every 4 (four) hours as needed (for cluster headaches). 30 tablet 0  . isosorbide mononitrate (IMDUR) 30 MG 24 hr tablet Take 1 tablet (30 mg total) by mouth daily. 30 tablet 6  . lamoTRIgine (LAMICTAL) 200 MG tablet Take 1 tablet (200 mg total) by mouth daily. 30 tablet 1  . metFORMIN (GLUCOPHAGE) 500 MG tablet Take 1 tablet (500 mg total) by mouth daily with breakfast.    .  nitroGLYCERIN (NITROSTAT) 0.4 MG SL tablet Place 1 tablet (0.4 mg total) under the tongue every 5 (five) minutes as needed for chest pain. 30 tablet 0  . spironolactone (ALDACTONE) 25 MG tablet Take 1 tablet (25 mg total) by mouth daily. (Patient taking differently: Take 25 mg by mouth at bedtime. ) 30 tablet 3   No current facility-administered medications on file prior to visit.     Allergies  Allergen Reactions  . No Known Allergies     Past Medical History:  Diagnosis Date  . Arthralgia of temporomandibular joint   . CAD, multiple vessel    a. cath 06/29/16: ostLM 40%, ostLAD 40%, pLAD 95%, ost-pLCx 60%, pLCx 95%, mLCx 60%, mRCA 95%, D2 50%, LVSF nl;  b. 07/2016 CABG x 4 (LIMA->LAD,  VG->Diag, VG->OM, VG->RCA).  . Carotid arterial disease ([place])    a. 07/2016 s/p R CEA.  . Depression   . Diastolic dysfunction    a.e cho 06/28/16: EF 50-55%, mild inf wall HK, GR1DD, mild MR, RV sys fxn nl, mildly dilated LA, PASP nl  . Fatty liver disease, nonalcoholic [id number]  . HLD (hyperlipidemia)   . Malignant hypertension   . Obesity   . PTSD (post-traumatic stress disorder)   . Tobacco abuse   . Type 2 diabetes mellitus [facility]) January 2017    Past Surgical History:  Procedure Laterality Date  . CARDIAC CATHETERIZATION [name] 06/29/2016   Procedure: Left Heart Cath and Coronary Angiography;  Surgeon: Minna Merritts, MD;  Location: [place] [facility];  Service: Cardiovascular;  Laterality: [name];  . CARDIAC CATHETERIZATION [name] 08/29/2016   Procedure: Left Heart Cath and Cors/Grafts Angiography;  Surgeon: Wellington Hampshire, MD;  Location: [place] [facility];  Service: Cardiovascular;  Laterality: [name];  . CESAREAN SECTION    . CHOLECYSTECTOMY    . CORONARY ARTERY BYPASS GRAFT [name] 07/06/2016   Procedure: CORONARY ARTERY BYPASS GRAFTING (CABG) x four, using left internal mammary artery and right leg greater saphenous vein harvested endoscopically;  Surgeon: Ivin Poot, MD;  Location: [place];  Service: Open Heart Surgery;  Laterality: [name];  . ENDARTERECTOMY Right 07/06/2016   Procedure: ENDARTERECTOMY CAROTID;  Surgeon: Rosetta Posner, MD;  Location: [place];  Service: Vascular;  Laterality: Right;  . PERIPHERAL VASCULAR CATHETERIZATION [name] 04/18/2016   Procedure: Renal Angiography;  Surgeon: Wellington Hampshire, MD;  Location: [place] [facility];  Service: Cardiovascular;  Laterality: [name];  . TEE WITHOUT CARDIOVERSION [name] 07/06/2016   Procedure: TRANSESOPHAGEAL ECHOCARDIOGRAM (TEE);  Surgeon: Ivin Poot, MD;  Location: [place];  Service: Open Heart Surgery;  Laterality: [name];  . TONSILLECTOMY      Family History  Problem Relation Age of Onset  . Adopted: Yes  . Diabetes Mother   . Diabetes  Father   . Alcohol abuse Father   . Heart disease Father   . Drug abuse Father   . Stroke Sister   . Anxiety disorder Sister     Social History   Social History  . Marital status: Married    Spouse name: [name]  . Number of children: [name]  . Years of education: [name]   Occupational History  . Not on file.   Social History Main Topics  . Smoking status: Former Smoker    Packs/day: 0.50    Types: Cigarettes    Quit date: 06/28/2016  . Smokeless tobacco: Never Used  . Alcohol use No  . Drug use: No  . Sexual activity: Yes  Birth control/ protection: None   Other Topics Concern  . Not on file   Social History Narrative  . No narrative on file   The PMH, PSH, Social History, Family History, Medications, and allergies have been reviewed in [facility], and have been updated if relevant.   Review of Systems  Constitutional: Negative.   HENT: Negative.   Respiratory: Negative.   Cardiovascular: Negative.   Gastrointestinal: Negative.   Musculoskeletal: Negative.   Neurological: Negative.   Psychiatric/Behavioral: Negative.   All other systems reviewed and are negative.      Objective:    BP 112/60   Pulse 69   Temp 97.9 F (36.6 C) (Oral)   Wt 205 lb 8 oz (93.2 kg)   LMP 09/18/2016   SpO2 93%   BMI 32.19 kg/m    Physical Exam  Constitutional: She is oriented to person, place, and time. She appears well-developed and well-nourished. No distress.  HENT:  Head: Normocephalic.  Eyes: Conjunctivae are normal.  Cardiovascular: Normal rate and regular rhythm.   Pulmonary/Chest: Effort normal and breath sounds normal.  Musculoskeletal: Normal range of motion.  Neurological: She is alert and oriented to person, place, and time. No cranial nerve deficit.  Skin: Skin is warm. She is not diaphoretic.  Psychiatric: She has a normal mood and affect. Her behavior is normal. Judgment and thought content normal.  Nursing note and vitals reviewed.         Assessment & Plan:    Hypertension, accelerated with heart disease, without CHF  Hyperlipidemia, unspecified hyperlipidemia type  Type 2 diabetes mellitus with other specified complication, without long-term current use of insulin ([place]) No Follow-up on file.

## 2016-10-02 NOTE — Progress Notes (Signed)
Pre visit review using our clinic review tool, if applicable. No additional management support is needed unless otherwise documented below in the visit note. 

## 2016-10-03 ENCOUNTER — Encounter: Payer: Self-pay | Admitting: Family Medicine

## 2016-10-03 ENCOUNTER — Encounter (HOSPITAL_COMMUNITY)
Admission: RE | Admit: 2016-10-03 | Discharge: 2016-10-03 | Disposition: A | Discharge status: Home / Self Care | Payer: BLUE CROSS/BLUE SHIELD | Source: Ambulatory Visit | Attending: Cardiovascular Disease | Admitting: Cardiovascular Disease

## 2016-10-03 DIAGNOSIS — M545 Low back pain: Secondary | ICD-10-CM | POA: Diagnosis not present

## 2016-10-03 DIAGNOSIS — M9903 Segmental and somatic dysfunction of lumbar region: Secondary | ICD-10-CM | POA: Diagnosis not present

## 2016-10-03 DIAGNOSIS — Z951 Presence of aortocoronary bypass graft: Secondary | ICD-10-CM

## 2016-10-03 DIAGNOSIS — M9902 Segmental and somatic dysfunction of thoracic region: Secondary | ICD-10-CM | POA: Diagnosis not present

## 2016-10-03 DIAGNOSIS — M9905 Segmental and somatic dysfunction of pelvic region: Secondary | ICD-10-CM | POA: Diagnosis not present

## 2016-10-03 NOTE — Progress Notes (Signed)
Daily Session Note  Patient Details  Name: Erika [name] MRN: [id number] Date of Birth: [birth year] Referring Provider:   Flowsheet Row CARDIAC REHAB PHASE II ORIENTATION from 09/13/2016 in [place]  Referring Provider  Dr. Fletcher Anon      Encounter Date: 10/03/2016  Check In:     Session Check In - 10/03/16 0930      Check-In   Location [facility]   Staff Present Russella Dar, MS, EP, [facility], Exercise Physiologist;Debra Wynetta Emery, RN, BSN;Larae Caison, BS, EP, Exercise Physiologist   Supervising physician immediately available to respond to emergencies See telemetry face sheet for immediately available MD   Medication changes reported     No   Fall or balance concerns reported    No   Warm-up and Cool-down Performed as group-led instruction   Resistance Training Performed Yes   VAD Patient? No     Pain Assessment   Currently in Pain? No/denies   Pain Score 0-No pain   Multiple Pain Sites No      Capillary Blood Glucose: No results found for this or any previous visit (from the past 24 hour(s)).   Goals Met:  Independence with exercise equipment Exercise tolerated well No report of cardiac concerns or symptoms Strength training completed today  Goals Unmet:  Not Applicable  Comments: Check out 1030   Dr. Kate Sable is Medical Director for [place] and Pulmonary Rehab.

## 2016-10-05 ENCOUNTER — Encounter (HOSPITAL_COMMUNITY)
Admission: RE | Admit: 2016-10-05 | Discharge: 2016-10-05 | Disposition: A | Discharge status: Home / Self Care | Payer: BLUE CROSS/BLUE SHIELD | Source: Ambulatory Visit | Attending: Cardiovascular Disease | Admitting: Cardiovascular Disease

## 2016-10-05 DIAGNOSIS — M9905 Segmental and somatic dysfunction of pelvic region: Secondary | ICD-10-CM | POA: Diagnosis not present

## 2016-10-05 DIAGNOSIS — M9903 Segmental and somatic dysfunction of lumbar region: Secondary | ICD-10-CM | POA: Diagnosis not present

## 2016-10-05 DIAGNOSIS — M545 Low back pain: Secondary | ICD-10-CM | POA: Diagnosis not present

## 2016-10-05 DIAGNOSIS — Z951 Presence of aortocoronary bypass graft: Secondary | ICD-10-CM | POA: Diagnosis not present

## 2016-10-05 DIAGNOSIS — M9902 Segmental and somatic dysfunction of thoracic region: Secondary | ICD-10-CM | POA: Diagnosis not present

## 2016-10-05 NOTE — Progress Notes (Signed)
Daily Session Note  Patient Details  Name: Erika [name] MRN: [id number] Date of Birth: 09-05-1971 Referring Provider:   Flowsheet Row CARDIAC REHAB PHASE II ORIENTATION from 09/13/2016 in [place]  Referring Provider  Dr. Fletcher Anon      Encounter Date: 10/05/2016  Check In:     Session Check In - 10/05/16 0930      Check-In   Location [facility]   Staff Present Russella Dar, MS, EP, [facility], Exercise Physiologist;Addy Mcmannis Wynetta Emery, RN, BSN   Supervising physician immediately available to respond to emergencies See telemetry face sheet for immediately available MD   Medication changes reported     No   Fall or balance concerns reported    No   Warm-up and Cool-down Performed as group-led instruction   Resistance Training Performed Yes   VAD Patient? No     Pain Assessment   Currently in Pain? No/denies   Pain Score 0-No pain   Multiple Pain Sites No      Capillary Blood Glucose: No results found for this or any previous visit (from the past 24 hour(s)).   Goals Met:  Independence with exercise equipment Exercise tolerated well No report of cardiac concerns or symptoms Strength training completed today  Goals Unmet:  Not Applicable  Comments: Check out 1030.   Dr. Kate Sable is Medical Director for [facility] Cardiac and Pulmonary Rehab.

## 2016-10-08 ENCOUNTER — Encounter (HOSPITAL_COMMUNITY)
Admission: RE | Admit: 2016-10-08 | Discharge: 2016-10-08 | Disposition: A | Discharge status: Home / Self Care | Payer: BLUE CROSS/BLUE SHIELD | Source: Ambulatory Visit | Attending: Cardiovascular Disease | Admitting: Cardiovascular Disease

## 2016-10-08 DIAGNOSIS — M545 Low back pain: Secondary | ICD-10-CM | POA: Diagnosis not present

## 2016-10-08 DIAGNOSIS — M9903 Segmental and somatic dysfunction of lumbar region: Secondary | ICD-10-CM | POA: Diagnosis not present

## 2016-10-08 DIAGNOSIS — M9902 Segmental and somatic dysfunction of thoracic region: Secondary | ICD-10-CM | POA: Diagnosis not present

## 2016-10-08 DIAGNOSIS — M9905 Segmental and somatic dysfunction of pelvic region: Secondary | ICD-10-CM | POA: Diagnosis not present

## 2016-10-08 DIAGNOSIS — Z951 Presence of aortocoronary bypass graft: Secondary | ICD-10-CM | POA: Diagnosis not present

## 2016-10-08 NOTE — Progress Notes (Signed)
Daily Session Note  Patient Details  Name: ARELENE MORONI MRN: [id number] Date of Birth: 1971-08-15 Referring Provider:   Flowsheet Row CARDIAC REHAB PHASE II ORIENTATION from 09/13/2016 in [place]  Referring Provider  Dr. Fletcher Anon      Encounter Date: 10/08/2016  Check In:     Session Check In - 10/08/16 0930      Check-In   Location [facility]   Staff Present Russella Dar, MS, EP, [facility], Exercise Physiologist;Maedell Hedger Wynetta Emery, RN, BSN   Supervising physician immediately available to respond to emergencies See telemetry face sheet for immediately available MD   Medication changes reported     No   Fall or balance concerns reported    No   Warm-up and Cool-down Performed as group-led instruction   Resistance Training Performed Yes   VAD Patient? No     Pain Assessment   Currently in Pain? No/denies   Pain Score 0-No pain   Multiple Pain Sites No      Capillary Blood Glucose: No results found for this or any previous visit (from the past 24 hour(s)).   Goals Met:  Independence with exercise equipment Exercise tolerated well No report of cardiac concerns or symptoms Strength training completed today  Goals Unmet:  Not Applicable  Comments: Check out 1030.   Dr. Kate Sable is Medical Director for [facility] Cardiac and Pulmonary Rehab.

## 2016-10-10 ENCOUNTER — Encounter (HOSPITAL_COMMUNITY)
Admission: RE | Admit: 2016-10-10 | Discharge: 2016-10-10 | Disposition: A | Discharge status: Home / Self Care | Payer: BLUE CROSS/BLUE SHIELD | Source: Ambulatory Visit | Attending: Cardiovascular Disease | Admitting: Cardiovascular Disease

## 2016-10-10 DIAGNOSIS — M9903 Segmental and somatic dysfunction of lumbar region: Secondary | ICD-10-CM | POA: Diagnosis not present

## 2016-10-10 DIAGNOSIS — M545 Low back pain: Secondary | ICD-10-CM | POA: Diagnosis not present

## 2016-10-10 DIAGNOSIS — M9905 Segmental and somatic dysfunction of pelvic region: Secondary | ICD-10-CM | POA: Diagnosis not present

## 2016-10-10 DIAGNOSIS — M9902 Segmental and somatic dysfunction of thoracic region: Secondary | ICD-10-CM | POA: Diagnosis not present

## 2016-10-10 DIAGNOSIS — Z951 Presence of aortocoronary bypass graft: Secondary | ICD-10-CM | POA: Diagnosis not present

## 2016-10-10 NOTE — Progress Notes (Signed)
Daily Session Note  Patient Details  Name: Erika [name] MRN: [id number] Date of Birth: 18-Feb-1971 Referring Provider:   Flowsheet Row CARDIAC REHAB PHASE II ORIENTATION from 09/13/2016 in [place]  Referring Provider  Dr. Fletcher Anon      Encounter Date: 10/10/2016  Check In:     Session Check In - 10/10/16 0930      Check-In   Location [facility]   Staff Present Diane Angelina Pih, MS, EP, [facility], Exercise Physiologist;Gregory Luther Parody, BS, EP, Exercise Physiologist;Daila Elbert Wynetta Emery, RN, BSN   Supervising physician immediately available to respond to emergencies See telemetry face sheet for immediately available MD   Medication changes reported     No   Fall or balance concerns reported    No   Warm-up and Cool-down Performed as group-led instruction   Resistance Training Performed Yes   VAD Patient? No     Pain Assessment   Currently in Pain? No/denies   Pain Score 0-No pain   Multiple Pain Sites No      Capillary Blood Glucose: No results found for this or any previous visit (from the past 24 hour(s)).   Goals Met:  Independence with exercise equipment Exercise tolerated well No report of cardiac concerns or symptoms Strength training completed today  Goals Unmet:  Not Applicable  Comments: Check out 1030.   Dr. Kate Sable is Medical Director for [facility] Cardiac and Pulmonary Rehab.

## 2016-10-12 ENCOUNTER — Encounter (HOSPITAL_COMMUNITY)
Admission: RE | Admit: 2016-10-12 | Discharge: 2016-10-12 | Disposition: A | Discharge status: Home / Self Care | Payer: BLUE CROSS/BLUE SHIELD | Source: Ambulatory Visit | Attending: Cardiovascular Disease | Admitting: Cardiovascular Disease

## 2016-10-12 DIAGNOSIS — Z951 Presence of aortocoronary bypass graft: Secondary | ICD-10-CM | POA: Diagnosis not present

## 2016-10-12 DIAGNOSIS — M545 Low back pain: Secondary | ICD-10-CM | POA: Diagnosis not present

## 2016-10-12 DIAGNOSIS — M9903 Segmental and somatic dysfunction of lumbar region: Secondary | ICD-10-CM | POA: Diagnosis not present

## 2016-10-12 DIAGNOSIS — I214 Non-ST elevation (NSTEMI) myocardial infarction: Secondary | ICD-10-CM

## 2016-10-12 DIAGNOSIS — M9905 Segmental and somatic dysfunction of pelvic region: Secondary | ICD-10-CM | POA: Diagnosis not present

## 2016-10-12 DIAGNOSIS — M9902 Segmental and somatic dysfunction of thoracic region: Secondary | ICD-10-CM | POA: Diagnosis not present

## 2016-10-12 NOTE — Progress Notes (Signed)
Daily Session Note  Patient Details  Name: CAMDEN MAZZAFERRO MRN: [id number] Date of Birth: 06/07/1971 Referring Provider:   Flowsheet Row CARDIAC REHAB PHASE II ORIENTATION from 09/13/2016 in [place]  Referring Provider  Dr. Fletcher Anon      Encounter Date: 10/12/2016  Check In:     Session Check In - 10/12/16 0930      Check-In   Location [facility]   Staff Present Russella Dar, MS, EP, [facility], Exercise Physiologist;Gregory Luther Parody, BS, EP, Exercise Physiologist   Supervising physician immediately available to respond to emergencies See telemetry face sheet for immediately available MD   Medication changes reported     No   Fall or balance concerns reported    No   Warm-up and Cool-down Performed as group-led instruction   Resistance Training Performed Yes   VAD Patient? No     Pain Assessment   Currently in Pain? No/denies   Pain Score 0-No pain   Multiple Pain Sites No      Capillary Blood Glucose: No results found for this or any previous visit (from the past 24 hour(s)).   Goals Met:  Independence with exercise equipment Exercise tolerated well No report of cardiac concerns or symptoms Strength training completed today  Goals Unmet:  Not Applicable  Comments: Check out: 1030   Dr. Kate Sable is Medical Director for [place] and Pulmonary Rehab.
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
## 2016-10-15 ENCOUNTER — Ambulatory Visit (INDEPENDENT_AMBULATORY_CARE_PROVIDER_SITE_OTHER): Payer: Self-pay | Admitting: Psychiatry

## 2016-10-15 ENCOUNTER — Encounter (HOSPITAL_COMMUNITY): Payer: Self-pay | Admitting: Psychiatry

## 2016-10-15 ENCOUNTER — Encounter (HOSPITAL_COMMUNITY): Payer: BLUE CROSS/BLUE SHIELD

## 2016-10-15 DIAGNOSIS — Z818 Family history of other mental and behavioral disorders: Secondary | ICD-10-CM

## 2016-10-15 DIAGNOSIS — F331 Major depressive disorder, recurrent, moderate: Secondary | ICD-10-CM

## 2016-10-15 DIAGNOSIS — Z79899 Other long term (current) drug therapy: Secondary | ICD-10-CM

## 2016-10-15 DIAGNOSIS — Z7982 Long term (current) use of aspirin: Secondary | ICD-10-CM

## 2016-10-15 MED ORDER — FLUOXETINE HCL 60 MG PO TABS: 60.0000 mg | ORAL_TABLET | Freq: Every day | ORAL | 0 refills | Status: DC

## 2016-10-15 MED ORDER — LORAZEPAM 0.5 MG PO TABS: ORAL_TABLET | ORAL | 0 refills | Status: DC

## 2016-10-15 MED ORDER — LAMOTRIGINE 200 MG PO TABS
200.0000 mg | ORAL_TABLET | Freq: Every day | ORAL | 0 refills | Status: DC
Start: 2016-10-15 — End: 2016-11-19

## 2016-10-15 NOTE — Patient Instructions (Addendum)
1. Increase fluoxetine 60 mg daily 2. Start lorazepam 0.5-1 mg at night as needed for sleep 3. Continue lamotrigine 200 mg daily 4. Return to clinic in one month

## 2016-10-17 ENCOUNTER — Encounter (HOSPITAL_COMMUNITY)
Admission: RE | Admit: 2016-10-17 | Discharge: 2016-10-17 | Disposition: A | Discharge status: Home / Self Care | Payer: BLUE CROSS/BLUE SHIELD | Source: Ambulatory Visit | Attending: Cardiovascular Disease | Admitting: Cardiovascular Disease

## 2016-10-17 DIAGNOSIS — M545 Low back pain: Secondary | ICD-10-CM | POA: Diagnosis not present

## 2016-10-17 DIAGNOSIS — Z951 Presence of aortocoronary bypass graft: Secondary | ICD-10-CM

## 2016-10-17 DIAGNOSIS — M9902 Segmental and somatic dysfunction of thoracic region: Secondary | ICD-10-CM | POA: Diagnosis not present

## 2016-10-17 DIAGNOSIS — M9905 Segmental and somatic dysfunction of pelvic region: Secondary | ICD-10-CM | POA: Diagnosis not present

## 2016-10-17 DIAGNOSIS — M9903 Segmental and somatic dysfunction of lumbar region: Secondary | ICD-10-CM | POA: Diagnosis not present

## 2016-10-17 NOTE — Progress Notes (Signed)
Daily Session Note  Patient Details  Name: Erika [name] MRN: [id number] Date of Birth: 1971-05-20 Referring Provider:   Flowsheet Row CARDIAC REHAB PHASE II ORIENTATION from 09/13/2016 in [facility] [place]  Referring Provider  Dr. Fletcher Anon      Encounter Date: 10/17/2016  Check In:     Session Check In - 10/17/16 0930      Check-In   Location [facility]   Staff Present Diane Angelina Pih, MS, EP, [facility], Exercise Physiologist;Gregory Luther Parody, BS, EP, Exercise Physiologist;Ved Martos Wynetta Emery, RN, BSN   Supervising physician immediately available to respond to emergencies See telemetry face sheet for immediately available MD   Medication changes reported     No   Fall or balance concerns reported    No   Warm-up and Cool-down Performed as group-led instruction   Resistance Training Performed Yes   VAD Patient? No     Pain Assessment   Currently in Pain? No/denies   Pain Score 0-No pain   Multiple Pain Sites No      Capillary Blood Glucose: No results found for this or any previous visit (from the past 24 hour(s)).   Goals Met:  Independence with exercise equipment Exercise tolerated well No report of cardiac concerns or symptoms Strength training completed today  Goals Unmet:  Not Applicable  Comments: Check out 1030.   Dr. Kate Sable is Medical Director for [facility] Cardiac and Pulmonary Rehab.

## 2016-10-19 ENCOUNTER — Encounter (HOSPITAL_COMMUNITY)
Admission: RE | Admit: 2016-10-19 | Discharge: 2016-10-19 | Disposition: A | Discharge status: Home / Self Care | Payer: BLUE CROSS/BLUE SHIELD | Source: Ambulatory Visit | Attending: Cardiovascular Disease | Admitting: Cardiovascular Disease

## 2016-10-19 DIAGNOSIS — M9905 Segmental and somatic dysfunction of pelvic region: Secondary | ICD-10-CM | POA: Diagnosis not present

## 2016-10-19 DIAGNOSIS — I214 Non-ST elevation (NSTEMI) myocardial infarction: Secondary | ICD-10-CM

## 2016-10-19 DIAGNOSIS — Z951 Presence of aortocoronary bypass graft: Secondary | ICD-10-CM

## 2016-10-19 DIAGNOSIS — M9903 Segmental and somatic dysfunction of lumbar region: Secondary | ICD-10-CM | POA: Diagnosis not present

## 2016-10-19 DIAGNOSIS — M9902 Segmental and somatic dysfunction of thoracic region: Secondary | ICD-10-CM | POA: Diagnosis not present

## 2016-10-19 DIAGNOSIS — M545 Low back pain: Secondary | ICD-10-CM | POA: Diagnosis not present

## 2016-10-19 NOTE — Progress Notes (Signed)
Daily Session Note  Patient Details  Name: Erika [name] MRN: [id number] Date of Birth: [birth year] Referring Provider:   Flowsheet Row CARDIAC REHAB PHASE II ORIENTATION from 09/13/2016 in [place]  Referring Provider  Dr. Fletcher Anon      Encounter Date: 10/19/2016  Check In:     Session Check In - 10/19/16 0915      Check-In   Location [facility]   Staff Present Russella Dar, MS, EP, [facility], Exercise Physiologist;Debra Wynetta Emery, RN, BSN   Supervising physician immediately available to respond to emergencies See telemetry face sheet for immediately available MD   Medication changes reported     No   Fall or balance concerns reported    No   Warm-up and Cool-down Performed as group-led instruction   Resistance Training Performed Yes   VAD Patient? No     Pain Assessment   Currently in Pain? No/denies   Pain Score 0-No pain   Multiple Pain Sites No      Capillary Blood Glucose: No results found for this or any previous visit (from the past 24 hour(s)).   Goals Met:  Independence with exercise equipment Exercise tolerated well No report of cardiac concerns or symptoms Strength training completed today  Goals Unmet:  Not Applicable  Comments: Check out: 1030   Dr. Kate Sable is Medical Director for [place] [place] and Pulmonary Rehab.

## 2016-10-22 ENCOUNTER — Encounter (HOSPITAL_COMMUNITY)
Admission: RE | Admit: 2016-10-22 | Discharge: 2016-10-22 | Disposition: A | Discharge status: Home / Self Care | Payer: BLUE CROSS/BLUE SHIELD | Source: Ambulatory Visit | Attending: Cardiovascular Disease | Admitting: Cardiovascular Disease

## 2016-10-22 DIAGNOSIS — M9903 Segmental and somatic dysfunction of lumbar region: Secondary | ICD-10-CM | POA: Diagnosis not present

## 2016-10-22 DIAGNOSIS — M9902 Segmental and somatic dysfunction of thoracic region: Secondary | ICD-10-CM | POA: Diagnosis not present

## 2016-10-22 DIAGNOSIS — M9905 Segmental and somatic dysfunction of pelvic region: Secondary | ICD-10-CM | POA: Diagnosis not present

## 2016-10-22 DIAGNOSIS — Z951 Presence of aortocoronary bypass graft: Secondary | ICD-10-CM | POA: Diagnosis not present

## 2016-10-22 DIAGNOSIS — I214 Non-ST elevation (NSTEMI) myocardial infarction: Secondary | ICD-10-CM

## 2016-10-22 DIAGNOSIS — M545 Low back pain: Secondary | ICD-10-CM | POA: Diagnosis not present

## 2016-10-22 NOTE — Progress Notes (Signed)
Daily Session Note  Patient Details  Name: Erika [name] MRN: [id number] Date of Birth: 08-Sep-1971 Referring Provider:   Flowsheet Row CARDIAC REHAB PHASE II ORIENTATION from 09/13/2016 in [facility]  Referring Provider  Dr. Fletcher Anon      Encounter Date: 10/22/2016  Check In:     Session Check In - 10/22/16 0930      Check-In   Location [facility]   Staff Present Russella Dar, MS, EP, [facility], Exercise Physiologist;Gregory Luther Parody, BS, EP, Exercise Physiologist   Supervising physician immediately available to respond to emergencies See telemetry face sheet for immediately available MD   Medication changes reported     No   Fall or balance concerns reported    No   Warm-up and Cool-down Performed as group-led instruction   Resistance Training Performed Yes   VAD Patient? No     Pain Assessment   Currently in Pain? No/denies   Pain Score 0-No pain   Multiple Pain Sites No      Capillary Blood Glucose: No results found for this or any previous visit (from the past 24 hour(s)).   Goals Met:  Independence with exercise equipment Exercise tolerated well No report of cardiac concerns or symptoms Strength training completed today  Goals Unmet:  Not Applicable  Comments: Check out: 1030   Dr. Kate Sable is Medical Director for [place] and Pulmonary Rehab.

## 2016-10-23 ENCOUNTER — Ambulatory Visit (INDEPENDENT_AMBULATORY_CARE_PROVIDER_SITE_OTHER): Payer: BLUE CROSS/BLUE SHIELD | Admitting: Cardiovascular Disease

## 2016-10-23 ENCOUNTER — Encounter: Payer: Self-pay | Admitting: Cardiovascular Disease

## 2016-10-23 VITALS — BP 150/92 | HR 77 | Ht 67.0 in | Wt 204.2 lb

## 2016-10-23 DIAGNOSIS — E782 Mixed hyperlipidemia: Secondary | ICD-10-CM

## 2016-10-23 DIAGNOSIS — I779 Disorder of arteries and arterioles, unspecified: Secondary | ICD-10-CM

## 2016-10-23 DIAGNOSIS — I2510 Atherosclerotic heart disease of native coronary artery without angina pectoris: Secondary | ICD-10-CM

## 2016-10-23 DIAGNOSIS — I739 Peripheral vascular disease, unspecified: Secondary | ICD-10-CM

## 2016-10-23 DIAGNOSIS — I10 Essential (primary) hypertension: Secondary | ICD-10-CM | POA: Diagnosis not present

## 2016-10-23 DIAGNOSIS — Z720 Tobacco use: Secondary | ICD-10-CM

## 2016-10-23 NOTE — Patient Instructions (Signed)
Medication Instructions: Continue same medications.   Labwork: None.   Procedures/Testing: None.   Follow-Up: 3 months with Dr. Emmylou Bieker.   Any Additional Special Instructions Will Be Listed Below (If Applicable).     If you need a refill on your cardiac medications before your next appointment, please call your pharmacy.   

## 2016-10-23 NOTE — Progress Notes (Signed)
Cardiology Office Note   Date:  10/23/2016   ID:  Erika [name], DOB [birth year], MRN [id number]  PCP:  Arnette Norris, MD Cardiologist:   [name], MD   Chief Complaint  Patient presents with  . other    2 month follow up. Meds reviewed by the pt. verbally. "doing well."       History of Present Illness: Erika [name] is a 45 y.o. female who presents for a follow-up visit regarding Coronary artery disease and carotid disease.  [name] with no evidence of secondary hypertension. She presented in July with non-ST elevation myocardial infarction. She underwent cardiac catheterization which showed severe three-vessel coronary artery disease.  Preop carotid Doppler showed 80-99% right ICA stenosis. She underwent CABG and right carotid endarterectomy at the same time on July 14 without complications.  She was rehospitalized in September with chest pain in the setting of uncontrolled hypertension. Cardiac catheterization showed patent grafts . The LAD had diffuse disease distal to the anastomosis and was very small in caliber. Ejection fraction was normal with mildly elevated left ventricular end-diastolic pressure. Blood pressure medications were adjusted with improvement. She has been doing well with no recurrent chest pain or shortness of breath. She continues to smoke a few cigarettes a day. She is attending cardiac rehabilitation.    Past Medical History:  Diagnosis Date  . Arthralgia of temporomandibular joint   . CAD, multiple vessel    a. cath 06/29/16: ostLM 40%, ostLAD 40%, pLAD 95%, ost-pLCx 60%, pLCx 95%, mLCx 60%, mRCA 95%, D2 50%, LVSF nl;  b. 07/2016 CABG x 4 (LIMA->LAD, VG->Diag, VG->OM, VG->RCA).  . Carotid arterial disease ([place])    a. 07/2016 s/p R CEA.  . Depression   . Diastolic dysfunction    a.e cho 06/28/16: EF 50-55%, mild inf wall HK, GR1DD, mild MR, RV sys fxn nl, mildly dilated LA, PASP nl  . Fatty liver  disease, nonalcoholic Q000111Q  . HLD (hyperlipidemia)   . Malignant hypertension   . Obesity   . PTSD (post-traumatic stress disorder)   . Tobacco abuse   . Type 2 diabetes mellitus [facility]) January 2017    Past Surgical History:  Procedure Laterality Date  . CARDIAC CATHETERIZATION [name] 06/29/2016   Procedure: Left Heart Cath and Coronary Angiography;  Surgeon: Minna Merritts, MD;  Location: [place] [facility];  Service: Cardiovascular;  Laterality: [name];  . CARDIAC CATHETERIZATION [name] 08/29/2016   Procedure: Left Heart Cath and Cors/Grafts Angiography;  Surgeon: Wellington Hampshire, MD;  Location: [place] [facility];  Service: Cardiovascular;  Laterality: [name];  . CESAREAN SECTION    . CHOLECYSTECTOMY    . CORONARY ARTERY BYPASS GRAFT [name] 07/06/2016   Procedure: CORONARY ARTERY BYPASS GRAFTING (CABG) x four, using left internal mammary artery and right leg greater saphenous vein harvested endoscopically;  Surgeon: Ivin Poot, MD;  Location: [place];  Service: Open Heart Surgery;  Laterality: [name];  . ENDARTERECTOMY Right 07/06/2016   Procedure: ENDARTERECTOMY CAROTID;  Surgeon: Rosetta Posner, MD;  Location: [place];  Service: Vascular;  Laterality: Right;  . PERIPHERAL VASCULAR CATHETERIZATION [name] 04/18/2016   Procedure: Renal Angiography;  Surgeon: Wellington Hampshire, MD;  Location: [place] [facility];  Service: Cardiovascular;  Laterality: [name];  . TEE WITHOUT CARDIOVERSION [name] 07/06/2016   Procedure: TRANSESOPHAGEAL ECHOCARDIOGRAM (TEE);  Surgeon: Ivin Poot, MD;  Location: [place];  Service: Open Heart Surgery;  Laterality: [name];  .  TONSILLECTOMY       Current Outpatient Prescriptions  Medication Sig Dispense Refill  . aspirin EC 325 MG EC tablet Take 1 tablet (325 mg total) by mouth daily. 30 tablet 0  . atorvastatin (LIPITOR) 80 MG tablet Take 1 tablet (80 mg total) by mouth daily at 6 PM. 30 tablet 1  . carvedilol (COREG) 25 MG tablet Take 1 tablet (25 mg total) by mouth 2 (two) times daily.  60 tablet 6  . FLUoxetine HCl 60 MG TABS Take 60 mg by mouth daily. 30 tablet 0  . HYDROcodone-acetaminophen (NORCO/VICODIN) 5-325 MG tablet Take 1 tablet by mouth every 4 (four) hours as needed (for cluster headaches). 30 tablet 0  . isosorbide mononitrate (IMDUR) 30 MG 24 hr tablet Take 1 tablet (30 mg total) by mouth daily. 30 tablet 6  . lamoTRIgine (LAMICTAL) 200 MG tablet Take 1 tablet (200 mg total) by mouth daily. 30 tablet 0  . LORazepam (ATIVAN) 0.5 MG tablet 0.5-1 mg at night as needed for insomnia 60 tablet 0  . metFORMIN (GLUCOPHAGE) 500 MG tablet Take 1 tablet (500 mg total) by mouth daily with breakfast.    . nitroGLYCERIN (NITROSTAT) 0.4 MG SL tablet Place 1 tablet (0.4 mg total) under the tongue every 5 (five) minutes as needed for chest pain. 30 tablet 0  . spironolactone (ALDACTONE) 25 MG tablet Take 1 tablet (25 mg total) by mouth daily. (Patient taking differently: Take 25 mg by mouth at bedtime. ) 30 tablet 3   No current facility-administered medications for this visit.     Allergies:   No known allergies    Social History:  The patient  reports that she quit smoking about 3 months ago. Her smoking use included Cigarettes. She smoked 0.50 packs per day. She has never used smokeless tobacco. She reports that she does not drink alcohol or use drugs.   Family History:  The patient's family history includes Alcohol abuse in her father; Anxiety disorder in her sister; Diabetes in her father and mother; Drug abuse in her father; Heart disease in her father; Stroke in her sister. She was adopted.    ROS:  Please see the history of present illness.   Otherwise, review of systems are positive for none.   All other systems are reviewed and negative.    PHYSICAL EXAM: VS:  BP (!) 150/92 (BP Location: Left Arm, Patient Position: Sitting, Cuff Size: Normal)   Pulse 77   Ht 5\' 7"  (1.702 m)   Wt 204 lb 4 oz (92.6 kg)   LMP 09/18/2016   BMI 31.99 kg/m  , BMI Body mass index is  31.99 kg/m. GEN: Well nourished, well developed, in no acute distress  HEENT: normal  Neck: no JVD, carotid bruits, or masses Cardiac: RRR; no  rubs, or gallops,no edema . There is 1/6 systolic flow murmur in the pulmonic area Respiratory:  clear to auscultation bilaterally, normal work of breathing GI: soft, nontender, nondistended, + BS MS: no deformity or atrophy  Skin: warm and dry, no rash Neuro:  Strength and sensation are intact Psych: euthymic mood, full affect Right groin is intact with no hematoma   EKG:  EKG is not ordered today.    Recent Labs: 06/30/2016: B Natriuretic Peptide 120.7; TSH 1.485 07/07/2016: Magnesium 2.2 09/03/2016: ALT 38; BUN 12; Creatinine, Ser 0.74; Hemoglobin 12.4; Platelets 517.0; Potassium 4.3; Sodium 137    Lipid Panel    Component Value Date/Time   CHOL 121 08/21/2016 0817  TRIG 164 (H) 08/21/2016 0817   HDL 24 (L) 08/21/2016 0817   CHOLHDL 5.0 (H) 08/21/2016 0817   CHOLHDL 9.5 06/28/2016 0544   VLDL UNABLE TO CALCULATE IF TRIGLYCERIDE OVER 400 mg/dL 06/28/2016 0544   LDLCALC 64 08/21/2016 0817   LDLDIRECT 177.1 04/22/2012 0809      Wt Readings from Last 3 Encounters:  10/23/16 204 lb 4 oz (92.6 kg)  10/02/16 205 lb 8 oz (93.2 kg)  09/13/16 202 lb 6.1 oz (91.8 kg)         ASSESSMENT AND PLAN:  1.  Coronary artery disease involving native coronary arteries without angina: Status post CABG in July. Continue medical therapy.  2. Carotid artery disease status post right carotid endarterectomy. Continue aggressive treatment of risk factors.  3. Essential hypertension: Blood pressure is mildly elevated. She reports normal blood pressure readings at home. Continue to monitor and we can consider adding an ACE inhibitor or ARB IF blood pressure remains elevated.  4. Hyperlipidemia: Continue high dose atorvastatin.  Most recent lipid profile in August showed significant improvement with an LDL of 64 and a triglyceride of 164.  5.  Tobacco use: I again discussed with her the importance of complete smoking cessation.   Disposition:   FU with me in 3 months  Signed,  [name], MD  10/23/2016 4:19 PM    [facility]

## 2016-10-24 ENCOUNTER — Encounter (HOSPITAL_COMMUNITY)
Admission: RE | Admit: 2016-10-24 | Discharge: 2016-10-24 | Disposition: A | Discharge status: Home / Self Care | Payer: BLUE CROSS/BLUE SHIELD | Source: Ambulatory Visit | Attending: Cardiovascular Disease | Admitting: Cardiovascular Disease

## 2016-10-24 DIAGNOSIS — Z951 Presence of aortocoronary bypass graft: Secondary | ICD-10-CM | POA: Insufficient documentation

## 2016-10-24 NOTE — Progress Notes (Signed)
Daily Session Note  Patient Details  Name: Erika [name] MRN: [id number] Date of Birth: [birth year] Referring Provider:   Flowsheet Row CARDIAC REHAB PHASE II ORIENTATION from 09/13/2016 in [place]  Referring Provider  Dr. Fletcher Anon      Encounter Date: 10/24/2016  Check In:     Session Check In - 10/24/16 0930      Check-In   Location [facility]   Staff Present Diane Angelina Pih, MS, EP, [facility], Exercise Physiologist;Gregory Luther Parody, BS, EP, Exercise Physiologist;Channing Yeager Wynetta Emery, RN, BSN   Supervising physician immediately available to respond to emergencies See telemetry face sheet for immediately available MD   Medication changes reported     No   Fall or balance concerns reported    No   Warm-up and Cool-down Performed as group-led instruction   Resistance Training Performed Yes   VAD Patient? No     Pain Assessment   Currently in Pain? No/denies   Pain Score 0-No pain   Multiple Pain Sites No      Capillary Blood Glucose: No results found for this or any previous visit (from the past 24 hour(s)).   Goals Met:  Independence with exercise equipment Exercise tolerated well No report of cardiac concerns or symptoms Strength training completed today  Goals Unmet:  Not Applicable  Comments: CHeck out 1030.   Dr. Kate Sable is Medical Director for [facility] Cardiac and Pulmonary Rehab.

## 2016-10-26 ENCOUNTER — Encounter (HOSPITAL_COMMUNITY)
Admission: RE | Admit: 2016-10-26 | Discharge: 2016-10-26 | Disposition: A | Discharge status: Home / Self Care | Payer: BLUE CROSS/BLUE SHIELD | Source: Ambulatory Visit | Attending: Cardiovascular Disease | Admitting: Cardiovascular Disease

## 2016-10-26 DIAGNOSIS — M9905 Segmental and somatic dysfunction of pelvic region: Secondary | ICD-10-CM | POA: Diagnosis not present

## 2016-10-26 DIAGNOSIS — M9903 Segmental and somatic dysfunction of lumbar region: Secondary | ICD-10-CM | POA: Diagnosis not present

## 2016-10-26 DIAGNOSIS — M545 Low back pain: Secondary | ICD-10-CM | POA: Diagnosis not present

## 2016-10-26 DIAGNOSIS — Z951 Presence of aortocoronary bypass graft: Secondary | ICD-10-CM | POA: Diagnosis not present

## 2016-10-26 DIAGNOSIS — M9902 Segmental and somatic dysfunction of thoracic region: Secondary | ICD-10-CM | POA: Diagnosis not present

## 2016-10-26 NOTE — Progress Notes (Signed)
Daily Session Note  Patient Details  Name: Erika [name] MRN: [id number] Date of Birth: [birth year] Referring Provider:   Flowsheet Row CARDIAC REHAB PHASE II ORIENTATION from 09/13/2016 in [place]  Referring Provider  Dr. Fletcher Anon      Encounter Date: 10/26/2016  Check In:     Session Check In - 10/26/16 0930      Check-In   Location [facility]   Staff Present Diane Angelina Pih, MS, EP, [facility], Exercise Physiologist;Gregory Luther Parody, BS, EP, Exercise Physiologist;Gabrella Stroh Wynetta Emery, RN, BSN   Supervising physician immediately available to respond to emergencies See telemetry face sheet for immediately available MD   Medication changes reported     No   Fall or balance concerns reported    No   Warm-up and Cool-down Performed as group-led instruction   Resistance Training Performed Yes   VAD Patient? No     Pain Assessment   Currently in Pain? No/denies   Pain Score 0-No pain   Multiple Pain Sites No      Capillary Blood Glucose: No results found for this or any previous visit (from the past 24 hour(s)).   Goals Met:  Independence with exercise equipment Exercise tolerated well No report of cardiac concerns or symptoms Strength training completed today  Goals Unmet:  Not Applicable  Comments: Check out 1030.   Dr. Kate Sable is Medical Director for [facility] Cardiac and Pulmonary Rehab.

## 2016-10-29 ENCOUNTER — Encounter (HOSPITAL_COMMUNITY)
Admission: RE | Admit: 2016-10-29 | Discharge: 2016-10-29 | Disposition: A | Discharge status: Home / Self Care | Payer: BLUE CROSS/BLUE SHIELD | Source: Ambulatory Visit | Attending: Cardiovascular Disease | Admitting: Cardiovascular Disease

## 2016-10-29 DIAGNOSIS — M9903 Segmental and somatic dysfunction of lumbar region: Secondary | ICD-10-CM | POA: Diagnosis not present

## 2016-10-29 DIAGNOSIS — Z951 Presence of aortocoronary bypass graft: Secondary | ICD-10-CM

## 2016-10-29 DIAGNOSIS — M9902 Segmental and somatic dysfunction of thoracic region: Secondary | ICD-10-CM | POA: Diagnosis not present

## 2016-10-29 DIAGNOSIS — M545 Low back pain: Secondary | ICD-10-CM | POA: Diagnosis not present

## 2016-10-29 DIAGNOSIS — M9905 Segmental and somatic dysfunction of pelvic region: Secondary | ICD-10-CM | POA: Diagnosis not present

## 2016-10-29 NOTE — Progress Notes (Signed)
Daily Session Note  Patient Details  Name: [name] MRN: [id number] Date of Birth: 08-12-1971 Referring Provider:   Flowsheet Row CARDIAC REHAB PHASE II ORIENTATION from 09/13/2016 in [place]  Referring Provider  Dr. Fletcher Anon      Encounter Date: 10/29/2016  Check In:     Session Check In - 10/29/16 0930      Check-In   Location [facility]   Staff Present Diane Angelina Pih, MS, EP, [facility], Exercise Physiologist;Gregory Luther Parody, BS, EP, Exercise Physiologist;Chozen Latulippe Wynetta Emery, RN, BSN   Supervising physician immediately available to respond to emergencies See telemetry face sheet for immediately available MD   Medication changes reported     No   Fall or balance concerns reported    No   Warm-up and Cool-down Performed as group-led instruction   Resistance Training Performed Yes   VAD Patient? No     Pain Assessment   Currently in Pain? No/denies   Pain Score 0-No pain   Multiple Pain Sites No      Capillary Blood Glucose: No results found for this or any previous visit (from the past 24 hour(s)).   Goals Met:  Independence with exercise equipment Exercise tolerated well No report of cardiac concerns or symptoms Strength training completed today  Goals Unmet:  Not Applicable  Comments: Check out 1030.   Dr. Kate Sable is Medical Director for [facility] Cardiac and Pulmonary Rehab.

## 2016-10-30 ENCOUNTER — Ambulatory Visit (INDEPENDENT_AMBULATORY_CARE_PROVIDER_SITE_OTHER): Payer: BLUE CROSS/BLUE SHIELD | Admitting: Family Medicine

## 2016-10-30 ENCOUNTER — Other Ambulatory Visit (HOSPITAL_COMMUNITY): Payer: Self-pay | Admitting: Psychiatry

## 2016-10-30 ENCOUNTER — Telehealth (HOSPITAL_COMMUNITY): Payer: Self-pay

## 2016-10-30 ENCOUNTER — Encounter: Payer: Self-pay | Admitting: Family Medicine

## 2016-10-30 VITALS — BP 112/64 | HR 64 | Temp 98.1°F | Wt 207.5 lb

## 2016-10-30 DIAGNOSIS — E1169 Type 2 diabetes mellitus with other specified complication: Secondary | ICD-10-CM | POA: Diagnosis not present

## 2016-10-30 DIAGNOSIS — F4323 Adjustment disorder with mixed anxiety and depressed mood: Secondary | ICD-10-CM | POA: Diagnosis not present

## 2016-10-30 DIAGNOSIS — G4700 Insomnia, unspecified: Secondary | ICD-10-CM | POA: Diagnosis not present

## 2016-10-30 MED ORDER — CLONAZEPAM 0.5 MG PO TABS: 0.5000 mg | ORAL_TABLET | Freq: Two times a day (BID) | ORAL | 0 refills | Status: DC | PRN

## 2016-10-30 NOTE — Telephone Encounter (Signed)
Called the patient and the pharmacy to restart it, fyi.

## 2016-10-30 NOTE — Assessment & Plan Note (Signed)
>  15 minutes spent in face to face time with patient, >50% spent in counselling or coordination of care. Explained to Erika [name] that she needs to call psychiatry to adjust her benzos- cannot be prescribed by more than one provider. The patient indicates understanding of these issues and agrees with the plan.

## 2016-10-30 NOTE — Telephone Encounter (Signed)
Patient went to see her PCP today and expressed that the ativan was not helping at all. Her PCP recommended she call us and see if she can go back to Klonopin. Patient said that [place] work a little better. Please review and advise, thank you

## 2016-10-30 NOTE — Progress Notes (Signed)
Subjective:   Patient ID: Erika [name], female    DOB: February 10, 1971, 45 y.o.   MRN: [id number]  ABIA HOCKENBURY is a pleasant 45 y.o. year old female who presents to clinic today with Follow-up (med changes)  on 10/30/2016  HPI:  Followed by psychiatry for anxiety and insomnia.  klonipin was d/c'd due to cardiac risk and placed on ativan which has been ineffective. She is not sleeping at all at night.  We tried trazodone in the past- ineffective.  OTC sleep aids have been ineffective as well. Current Outpatient Prescriptions on File Prior to Visit  Medication Sig Dispense Refill  . aspirin EC 325 MG EC tablet Take 1 tablet (325 mg total) by mouth daily. 30 tablet 0  . atorvastatin (LIPITOR) 80 MG tablet Take 1 tablet (80 mg total) by mouth daily at 6 PM. 30 tablet 1  . carvedilol (COREG) 25 MG tablet Take 1 tablet (25 mg total) by mouth 2 (two) times daily. 60 tablet 6  . FLUoxetine HCl 60 MG TABS Take 60 mg by mouth daily. 30 tablet 0  . HYDROcodone-acetaminophen (NORCO/VICODIN) 5-325 MG tablet Take 1 tablet by mouth every 4 (four) hours as needed (for cluster headaches). 30 tablet 0  . isosorbide mononitrate (IMDUR) 30 MG 24 hr tablet Take 1 tablet (30 mg total) by mouth daily. 30 tablet 6  . lamoTRIgine (LAMICTAL) 200 MG tablet Take 1 tablet (200 mg total) by mouth daily. 30 tablet 0  . LORazepam (ATIVAN) 0.5 MG tablet 0.5-1 mg at night as needed for insomnia 60 tablet 0  . metFORMIN (GLUCOPHAGE) 500 MG tablet Take 1 tablet (500 mg total) by mouth daily with breakfast.    . nitroGLYCERIN (NITROSTAT) 0.4 MG SL tablet Place 1 tablet (0.4 mg total) under the tongue every 5 (five) minutes as needed for chest pain. 30 tablet 0  . spironolactone (ALDACTONE) 25 MG tablet Take 1 tablet (25 mg total) by mouth daily. (Patient taking differently: Take 25 mg by mouth at bedtime. ) 30 tablet 3   No current facility-administered medications on file prior to visit.     Allergies    Allergen Reactions  . No Known Allergies     Past Medical History:  Diagnosis Date  . Arthralgia of temporomandibular joint   . CAD, multiple vessel    a. cath 06/29/16: ostLM 40%, ostLAD 40%, pLAD 95%, ost-pLCx 60%, pLCx 95%, mLCx 60%, mRCA 95%, D2 50%, LVSF nl;  b. 07/2016 CABG x 4 (LIMA->LAD, VG->Diag, VG->OM, VG->RCA).  . Carotid arterial disease ([place])    a. 07/2016 s/p R CEA.  . Depression   . Diastolic dysfunction    a.e cho 06/28/16: EF 50-55%, mild inf wall HK, GR1DD, mild MR, RV sys fxn nl, mildly dilated LA, PASP nl  . Fatty liver disease, nonalcoholic Q000111Q  . HLD (hyperlipidemia)   . Malignant hypertension   . Obesity   . PTSD (post-traumatic stress disorder)   . Tobacco abuse   . Type 2 diabetes mellitus [name]) January 2017    Past Surgical History:  Procedure Laterality Date  . CARDIAC CATHETERIZATION [name] 06/29/2016   Procedure: Left Heart Cath and Coronary Angiography;  Surgeon: Minna Merritts, MD;  Location: [place] CV [facility];  Service: Cardiovascular;  Laterality: [name];  . CARDIAC CATHETERIZATION [name] 08/29/2016   Procedure: Left Heart Cath and Cors/Grafts Angiography;  Surgeon: Wellington Hampshire, MD;  Location: [place] [facility];  Service: Cardiovascular;  Laterality: [name];  . CESAREAN  SECTION    . CHOLECYSTECTOMY    . CORONARY ARTERY BYPASS GRAFT [name] 07/06/2016   Procedure: CORONARY ARTERY BYPASS GRAFTING (CABG) x four, using left internal mammary artery and right leg greater saphenous vein harvested endoscopically;  Surgeon: Ivin Poot, MD;  Location: [place];  Service: Open Heart Surgery;  Laterality: [name];  . ENDARTERECTOMY Right 07/06/2016   Procedure: ENDARTERECTOMY CAROTID;  Surgeon: Rosetta Posner, MD;  Location: [place];  Service: Vascular;  Laterality: Right;  . PERIPHERAL VASCULAR CATHETERIZATION [name] 04/18/2016   Procedure: Renal Angiography;  Surgeon: Wellington Hampshire, MD;  Location: [place] CV [facility];  Service: Cardiovascular;  Laterality: [name];  . TEE WITHOUT  CARDIOVERSION [name] 07/06/2016   Procedure: TRANSESOPHAGEAL ECHOCARDIOGRAM (TEE);  Surgeon: Ivin Poot, MD;  Location: [place];  Service: Open Heart Surgery;  Laterality: [name];  . TONSILLECTOMY      Family History  Problem Relation Age of Onset  . Adopted: Yes  . Diabetes Mother   . Diabetes Father   . Alcohol abuse Father   . Heart disease Father   . Drug abuse Father   . Stroke Sister   . Anxiety disorder Sister     Social History   Social History  . Marital status: Married    Spouse name: [name]  . Number of children: [name]  . Years of education: [name]   Occupational History  . Not on file.   Social History Main Topics  . Smoking status: Former Smoker    Packs/day: 0.50    Types: Cigarettes    Quit date: 06/28/2016  . Smokeless tobacco: Never Used  . Alcohol use No  . Drug use: No  . Sexual activity: Yes    Birth control/ protection: None   Other Topics Concern  . Not on file   Social History Narrative  . No narrative on file   The PMH, PSH, Social History, Family History, Medications, and allergies have been reviewed in [facility], and have been updated if relevant.  Review of Systems  Psychiatric/Behavioral: Positive for sleep disturbance. Negative for agitation, behavioral problems, confusion, decreased concentration, dysphoric mood, hallucinations, self-injury and suicidal ideas. The patient is nervous/anxious. The patient is not hyperactive.   All other systems reviewed and are negative.      Objective:    BP 112/64   Pulse 64   Temp 98.1 F (36.7 C) (Oral)   Wt 207 lb 8 oz (94.1 kg)   LMP 09/24/2016 (Approximate)   SpO2 95%   BMI 32.50 kg/m    Physical Exam  Constitutional: She is oriented to person, place, and time. She appears well-developed and well-nourished. No distress.  HENT:  Head: Normocephalic.  Eyes: Conjunctivae are normal.  Cardiovascular: Normal rate.   Pulmonary/Chest: Effort normal.  Neurological: She is alert and oriented to person,  place, and time. No cranial nerve deficit.  Skin: Skin is warm and dry. She is not diaphoretic.  Psychiatric: She has a normal mood and affect. Her behavior is normal. Judgment and thought content normal.  Nursing note and vitals reviewed.         Assessment & Plan:   Type 2 diabetes mellitus with other specified complication, without long-term current use of insulin (HCC) No Follow-up on file.

## 2016-10-30 NOTE — Telephone Encounter (Signed)
Patient reports limited effect from Ativan. Clonazepam was switched to Ativan at the last visit as patient was advised by her cardiologist to discontinue it for her heart condition per report. Patient talked with her PCP, who finds no contraindication to restart clonazepam. Will restart clonazepam 0.5 mg BID. Called pharmacy, 60 tabs with no refills.

## 2016-10-31 ENCOUNTER — Encounter (HOSPITAL_COMMUNITY): Admission: RE | Admit: 2016-10-31 | Payer: BLUE CROSS/BLUE SHIELD | Source: Ambulatory Visit

## 2016-10-31 DIAGNOSIS — M9905 Segmental and somatic dysfunction of pelvic region: Secondary | ICD-10-CM | POA: Diagnosis not present

## 2016-10-31 DIAGNOSIS — M9903 Segmental and somatic dysfunction of lumbar region: Secondary | ICD-10-CM | POA: Diagnosis not present

## 2016-10-31 DIAGNOSIS — M9902 Segmental and somatic dysfunction of thoracic region: Secondary | ICD-10-CM | POA: Diagnosis not present

## 2016-10-31 DIAGNOSIS — M545 Low back pain: Secondary | ICD-10-CM | POA: Diagnosis not present

## 2016-11-02 ENCOUNTER — Encounter (HOSPITAL_COMMUNITY): Payer: BLUE CROSS/BLUE SHIELD

## 2016-11-05 ENCOUNTER — Encounter: Payer: Self-pay | Admitting: Family Medicine

## 2016-11-05 ENCOUNTER — Encounter (HOSPITAL_COMMUNITY): Payer: BLUE CROSS/BLUE SHIELD

## 2016-11-07 ENCOUNTER — Encounter (HOSPITAL_COMMUNITY): Payer: BLUE CROSS/BLUE SHIELD

## 2016-11-09 ENCOUNTER — Encounter (HOSPITAL_COMMUNITY)
Admission: RE | Admit: 2016-11-09 | Discharge: 2016-11-09 | Disposition: A | Discharge status: Home / Self Care | Payer: BLUE CROSS/BLUE SHIELD | Source: Ambulatory Visit | Attending: Cardiovascular Disease | Admitting: Cardiovascular Disease

## 2016-11-09 DIAGNOSIS — M9903 Segmental and somatic dysfunction of lumbar region: Secondary | ICD-10-CM | POA: Diagnosis not present

## 2016-11-09 DIAGNOSIS — M9905 Segmental and somatic dysfunction of pelvic region: Secondary | ICD-10-CM | POA: Diagnosis not present

## 2016-11-09 DIAGNOSIS — Z951 Presence of aortocoronary bypass graft: Secondary | ICD-10-CM

## 2016-11-09 DIAGNOSIS — M545 Low back pain: Secondary | ICD-10-CM | POA: Diagnosis not present

## 2016-11-09 DIAGNOSIS — M9902 Segmental and somatic dysfunction of thoracic region: Secondary | ICD-10-CM | POA: Diagnosis not present

## 2016-11-09 NOTE — Progress Notes (Signed)
Daily Session Note  Patient Details  Name: Erika [name] MRN: [id number] Date of Birth: [birth year] Referring Provider:   Flowsheet Row CARDIAC REHAB PHASE II ORIENTATION from 09/13/2016 in [place]  Referring Provider  Dr. Fletcher Anon      Encounter Date: 11/09/2016  Check In:     Session Check In - 11/09/16 0930      Check-In   Location [facility]   Staff Present Suzanne Boron, BS, EP, Exercise Physiologist;Niveah Boerner Wynetta Emery, RN, BSN   Supervising physician immediately available to respond to emergencies See telemetry face sheet for immediately available MD   Medication changes reported     No   Fall or balance concerns reported    No   Warm-up and Cool-down Performed as group-led instruction   Resistance Training Performed Yes   VAD Patient? No     Pain Assessment   Currently in Pain? No/denies   Pain Score 0-No pain   Multiple Pain Sites No      Capillary Blood Glucose: No results found for this or any previous visit (from the past 24 hour(s)).   Goals Met:  Independence with exercise equipment Exercise tolerated well No report of cardiac concerns or symptoms Strength training completed today  Goals Unmet:  Not Applicable  Comments: Check out 1030.   Dr. Kate Sable is Medical Director for [facility] Cardiac and Pulmonary Rehab.

## 2016-11-12 ENCOUNTER — Encounter (HOSPITAL_COMMUNITY): Payer: BLUE CROSS/BLUE SHIELD

## 2016-11-12 DIAGNOSIS — M9905 Segmental and somatic dysfunction of pelvic region: Secondary | ICD-10-CM | POA: Diagnosis not present

## 2016-11-12 DIAGNOSIS — M545 Low back pain: Secondary | ICD-10-CM | POA: Diagnosis not present

## 2016-11-12 DIAGNOSIS — M9902 Segmental and somatic dysfunction of thoracic region: Secondary | ICD-10-CM | POA: Diagnosis not present

## 2016-11-12 DIAGNOSIS — M9903 Segmental and somatic dysfunction of lumbar region: Secondary | ICD-10-CM | POA: Diagnosis not present
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
## 2016-11-14 ENCOUNTER — Encounter (HOSPITAL_COMMUNITY): Payer: BLUE CROSS/BLUE SHIELD

## 2016-11-16 ENCOUNTER — Encounter (HOSPITAL_COMMUNITY): Payer: BLUE CROSS/BLUE SHIELD

## 2016-11-19 ENCOUNTER — Encounter (HOSPITAL_COMMUNITY): Payer: Self-pay | Admitting: Psychiatry

## 2016-11-19 ENCOUNTER — Encounter (HOSPITAL_COMMUNITY): Payer: BLUE CROSS/BLUE SHIELD

## 2016-11-19 ENCOUNTER — Ambulatory Visit (INDEPENDENT_AMBULATORY_CARE_PROVIDER_SITE_OTHER): Payer: Self-pay | Admitting: Psychiatry

## 2016-11-19 DIAGNOSIS — M545 Low back pain: Secondary | ICD-10-CM | POA: Diagnosis not present

## 2016-11-19 DIAGNOSIS — M9905 Segmental and somatic dysfunction of pelvic region: Secondary | ICD-10-CM | POA: Diagnosis not present

## 2016-11-19 DIAGNOSIS — M9903 Segmental and somatic dysfunction of lumbar region: Secondary | ICD-10-CM | POA: Diagnosis not present

## 2016-11-19 DIAGNOSIS — F331 Major depressive disorder, recurrent, moderate: Secondary | ICD-10-CM

## 2016-11-19 DIAGNOSIS — Z7982 Long term (current) use of aspirin: Secondary | ICD-10-CM

## 2016-11-19 DIAGNOSIS — R45851 Suicidal ideations: Secondary | ICD-10-CM

## 2016-11-19 DIAGNOSIS — Z79899 Other long term (current) drug therapy: Secondary | ICD-10-CM

## 2016-11-19 DIAGNOSIS — M9902 Segmental and somatic dysfunction of thoracic region: Secondary | ICD-10-CM | POA: Diagnosis not present

## 2016-11-19 MED ORDER — CLONAZEPAM 0.5 MG PO TABS: 0.5000 mg | ORAL_TABLET | Freq: Two times a day (BID) | ORAL | 1 refills | Status: DC | PRN

## 2016-11-19 MED ORDER — CLONAZEPAM 0.5 MG PO TABS: 0.5000 mg | ORAL_TABLET | Freq: Two times a day (BID) | ORAL | 2 refills | Status: DC | PRN

## 2016-11-19 MED ORDER — FLUOXETINE HCL 60 MG PO TABS: 60.0000 mg | ORAL_TABLET | Freq: Every day | ORAL | 2 refills | Status: DC

## 2016-11-19 MED ORDER — LAMOTRIGINE 200 MG PO TABS: 200.0000 mg | ORAL_TABLET | Freq: Every day | ORAL | 2 refills | Status: DC

## 2016-11-19 NOTE — Patient Instructions (Addendum)
1. Continue floxetine 60 mg daily 2. Continue lamotrigine 200 mg daily 3. Continue clonazepam 0.5 mg twice a day as needed for anxiety 4. Try mindfulness exercise 5. Call [place] at [phone] and ask to schedule a brief meeting with Perry Mount or Joylene Igo( group facilitators) for [facility]personal [facility] at [facility]

## 2016-11-21 ENCOUNTER — Encounter (HOSPITAL_COMMUNITY)
Admission: RE | Admit: 2016-11-21 | Discharge: 2016-11-21 | Disposition: A | Discharge status: Home / Self Care | Payer: BLUE CROSS/BLUE SHIELD | Source: Ambulatory Visit | Attending: Cardiovascular Disease | Admitting: Cardiovascular Disease

## 2016-11-21 DIAGNOSIS — Z951 Presence of aortocoronary bypass graft: Secondary | ICD-10-CM

## 2016-11-21 NOTE — Progress Notes (Signed)
Incomplete Session Note  Patient Details  Name: Erika [name] MRN: [id number] Date of Birth: [birth year] Referring Provider:   Flowsheet Row CARDIAC REHAB PHASE II ORIENTATION from 09/13/2016 in [facility]  Referring Provider  Dr. Marya Amsler [name] did not complete her rehab session.  Her initial resting b/p was 200/120. She said she had a h/a but otherwise asymptomatic. She was instructed to call her pcp and go home and rest.

## 2016-11-23 ENCOUNTER — Encounter (HOSPITAL_COMMUNITY): Payer: BLUE CROSS/BLUE SHIELD

## 2016-11-23 DIAGNOSIS — M9905 Segmental and somatic dysfunction of pelvic region: Secondary | ICD-10-CM | POA: Diagnosis not present

## 2016-11-23 DIAGNOSIS — M9903 Segmental and somatic dysfunction of lumbar region: Secondary | ICD-10-CM | POA: Diagnosis not present

## 2016-11-23 DIAGNOSIS — M545 Low back pain: Secondary | ICD-10-CM | POA: Diagnosis not present

## 2016-11-23 DIAGNOSIS — M9902 Segmental and somatic dysfunction of thoracic region: Secondary | ICD-10-CM | POA: Diagnosis not present

## 2016-11-26 ENCOUNTER — Encounter (HOSPITAL_COMMUNITY)
Admission: RE | Admit: 2016-11-26 | Discharge: 2016-11-26 | Disposition: A | Discharge status: Home / Self Care | Payer: BLUE CROSS/BLUE SHIELD | Source: Ambulatory Visit | Attending: Cardiovascular Disease | Admitting: Cardiovascular Disease

## 2016-11-26 DIAGNOSIS — Z951 Presence of aortocoronary bypass graft: Secondary | ICD-10-CM | POA: Diagnosis not present

## 2016-11-26 DIAGNOSIS — G8918 Other acute postprocedural pain: Secondary | ICD-10-CM | POA: Diagnosis not present

## 2016-11-26 NOTE — Progress Notes (Signed)
Daily Session Note  Patient Details  Name: Erika [name] MRN: [id number] Date of Birth: 01/09/1971 Referring Provider:   Flowsheet Row CARDIAC REHAB PHASE II ORIENTATION from 09/13/2016 in [facility]  Referring Provider  Dr. Arida      Encounter Date: 11/26/2016  Check In:     Session Check In - 11/26/16 0930      Check-In   Location [facility]   Staff Present Gregory Cowan, BS, EP, Exercise Physiologist; , RN, BSN   Supervising physician immediately available to respond to emergencies See telemetry face sheet for immediately available MD   Medication changes reported     No   Fall or balance concerns reported    No   Warm-up and Cool-down Performed as group-led instruction   Resistance Training Performed Yes   VAD Patient? No     Pain Assessment   Currently in Pain? No/denies   Pain Score 0-No pain   Multiple Pain Sites No      Capillary Blood Glucose: No results found for this or any previous visit (from the past 24 hour(s)).   Goals Met:  Independence with exercise equipment Exercise tolerated well No report of cardiac concerns or symptoms Strength training completed today  Goals Unmet:  Not Applicable  Comments: Check out 1030.   Dr. Suresh Koneswaran is Medical Director for [facility]. 

## 2016-11-28 ENCOUNTER — Encounter (HOSPITAL_COMMUNITY)
Admission: RE | Admit: 2016-11-28 | Discharge: 2016-11-28 | Disposition: A | Discharge status: Home / Self Care | Payer: BLUE CROSS/BLUE SHIELD | Source: Ambulatory Visit | Attending: Cardiovascular Disease | Admitting: Cardiovascular Disease

## 2016-11-28 DIAGNOSIS — M9903 Segmental and somatic dysfunction of lumbar region: Secondary | ICD-10-CM | POA: Diagnosis not present

## 2016-11-28 DIAGNOSIS — M545 Low back pain: Secondary | ICD-10-CM | POA: Diagnosis not present

## 2016-11-28 DIAGNOSIS — M9905 Segmental and somatic dysfunction of pelvic region: Secondary | ICD-10-CM | POA: Diagnosis not present

## 2016-11-28 DIAGNOSIS — Z951 Presence of aortocoronary bypass graft: Secondary | ICD-10-CM | POA: Diagnosis not present

## 2016-11-28 DIAGNOSIS — M9902 Segmental and somatic dysfunction of thoracic region: Secondary | ICD-10-CM | POA: Diagnosis not present

## 2016-11-28 NOTE — Progress Notes (Addendum)
Daily Session Note  Patient Details  Name: Erika [name] MRN: [id number] Date of Birth: 11/02/1971 Referring Provider:   Flowsheet Row CARDIAC REHAB PHASE II ORIENTATION from 09/13/2016 in [facility]  Referring Provider  Dr. Arida      Encounter Date: 11/28/2016  Check In:     Session Check In - 11/28/16 0930      Check-In   Location [facility]   Staff Present Gregory Cowan, BS, EP, Exercise Physiologist; , RN, BSN   Supervising physician immediately available to respond to emergencies See telemetry face sheet for immediately available MD   Medication changes reported     No   Fall or balance concerns reported    No   Warm-up and Cool-down Performed as group-led instruction   Resistance Training Performed Yes   VAD Patient? No     Pain Assessment   Currently in Pain? No/denies   Pain Score 0-No pain   Multiple Pain Sites No      Capillary Blood Glucose: No results found for this or any previous visit (from the past 24 hour(s)).   Goals Met:  Patient's b/p was elevated on Treadmill 200/110. Speed was reduced. Level was reduced on NuStep. B/p was 210/108 at halfway.  Patient was stopped and put in recovery. She had no c/o of pain or discomfort. HR was WNL for exercise. No EKG changes noted.   Goals Unmet:  Not Applicable  Comments: Check out 1030.   Dr. Suresh Koneswaran is Medical Director for [facility]. 

## 2016-11-30 ENCOUNTER — Encounter (HOSPITAL_COMMUNITY)
Admission: RE | Admit: 2016-11-30 | Discharge: 2016-11-30 | Disposition: A | Discharge status: Home / Self Care | Payer: BLUE CROSS/BLUE SHIELD | Source: Ambulatory Visit | Attending: Cardiovascular Disease | Admitting: Cardiovascular Disease

## 2016-11-30 DIAGNOSIS — Z951 Presence of aortocoronary bypass graft: Secondary | ICD-10-CM | POA: Diagnosis not present

## 2016-11-30 NOTE — Progress Notes (Signed)
Daily Session Note  Patient Details  Name: SHANNEL ZAHM MRN: [id number] Date of Birth: November 19, 1971 Referring Provider:   Flowsheet Row CARDIAC REHAB PHASE II ORIENTATION from 09/13/2016 in [place]  Referring Provider  Dr. Fletcher Anon      Encounter Date: 11/30/2016  Check In:     Session Check In - 11/30/16 0930      Check-In   Location [facility]   Staff Present Suzanne Boron, BS, EP, Exercise Physiologist;Laurice Iglesia Wynetta Emery, RN, BSN   Supervising physician immediately available to respond to emergencies See telemetry face sheet for immediately available MD   Medication changes reported     No   Fall or balance concerns reported    No   Warm-up and Cool-down Performed as group-led instruction   Resistance Training Performed Yes   VAD Patient? No     Pain Assessment   Currently in Pain? No/denies   Pain Score 0-No pain   Multiple Pain Sites No      Capillary Blood Glucose: No results found for this or any previous visit (from the past 24 hour(s)).   Goals Met:  Independence with exercise equipment Exercise tolerated well No report of cardiac concerns or symptoms Strength training completed today  Goals Unmet:  Not Applicable  Comments: Check out 1030.   Dr. Kate Sable is Medical Director for [facility].

## 2016-12-03 ENCOUNTER — Other Ambulatory Visit: Payer: Self-pay | Admitting: *Deleted

## 2016-12-03 ENCOUNTER — Encounter (HOSPITAL_COMMUNITY): Payer: BLUE CROSS/BLUE SHIELD

## 2016-12-03 MED ORDER — ATORVASTATIN CALCIUM 80 MG PO TABS: 80.0000 mg | ORAL_TABLET | Freq: Every day | ORAL | 2 refills | Status: DC

## 2016-12-04 ENCOUNTER — Other Ambulatory Visit: Payer: Self-pay | Admitting: Family Medicine

## 2016-12-04 ENCOUNTER — Encounter: Payer: Self-pay | Admitting: Family Medicine

## 2016-12-04 ENCOUNTER — Ambulatory Visit (INDEPENDENT_AMBULATORY_CARE_PROVIDER_SITE_OTHER): Payer: BLUE CROSS/BLUE SHIELD | Admitting: Family Medicine

## 2016-12-04 VITALS — BP 150/90 | HR 69 | Temp 97.7°F | Wt 216.2 lb

## 2016-12-04 DIAGNOSIS — I10 Essential (primary) hypertension: Secondary | ICD-10-CM

## 2016-12-04 DIAGNOSIS — G44001 Cluster headache syndrome, unspecified, intractable: Secondary | ICD-10-CM | POA: Diagnosis not present

## 2016-12-04 MED ORDER — HYDROCODONE-ACETAMINOPHEN 7.5-325 MG PO TABS: 1.0000 | ORAL_TABLET | Freq: Four times a day (QID) | ORAL | 0 refills | Status: DC | PRN

## 2016-12-04 MED ORDER — LOSARTAN POTASSIUM 50 MG PO TABS: 50.0000 mg | ORAL_TABLET | Freq: Every day | ORAL | 3 refills | Status: DC

## 2016-12-04 NOTE — Progress Notes (Signed)
Subjective:   Patient ID: Erika [name], female    DOB: Oct 15, 1971, 45 y.o.   MRN: [id number]  Erika [name] is a pleasant 45 y.o. year old female who presents to clinic today with Follow-up (BP) and Headache  on 12/04/2016 Oct 15, 1971, 45 y.o.   MRN: [id number]  Erika [name] is a pleasant 45 y.o. year old female who presents to clinic today with Follow-up (BP) and Headache  on 12/04/2016  HPI:  HTN- known h/o refractory HTN which has been much better since her CABG and right CEA in 06/2016.  Currently taking coreg, imdur and spironolactone. Last saw Dr. Fletcher Anon on 10/23/16.  At that [place], he mentioned adding and ACEI or ARB if needed.  BP has been consistently elevated for weeks.  Was told she could not participate in cardiac rehab the other day because BP was high.    Headaches are worse.  Takes norco but tries not to take it.  Feels it is not as effective.  Tramadol did not work.  BP Readings from Last 3 Encounters:  12/04/16 (!) 150/90  10/30/16 112/64  10/23/16 (!) 150/92    Current Outpatient Prescriptions on File Prior to Visit  Medication Sig Dispense Refill  . aspirin EC 325 MG EC tablet Take 1 tablet (325 mg total) by mouth daily. 30 tablet 0  . atorvastatin (LIPITOR) 80 MG tablet Take 1 tablet (80 mg total) by mouth daily at 6 PM. 90 tablet 2  . carvedilol (COREG) 25 MG tablet Take 1 tablet (25 mg total) by mouth 2 (two) times daily. 60 tablet 6  . clonazePAM (KLONOPIN) 0.5 MG tablet Take 1 tablet (0.5 mg total) by mouth 2 (two) times daily as needed for anxiety. 60 tablet 1  . FLUoxetine HCl 60 MG TABS Take 60 mg by mouth daily. 30 tablet 2  . isosorbide mononitrate (IMDUR) 30 MG 24 hr tablet Take 1 tablet (30 mg total) by mouth daily. 30 tablet 6  . lamoTRIgine (LAMICTAL) 200 MG tablet Take 1 tablet (200 mg total) by mouth daily. 30 tablet 2  . metFORMIN (GLUCOPHAGE) 500 MG tablet Take 1 tablet (500 mg total) by mouth daily with breakfast.    . nitroGLYCERIN (NITROSTAT) 0.4 MG SL tablet Place 1 tablet (0.4 mg total) under the tongue every 5 (five) minutes as needed for chest pain. 30 tablet 0  . spironolactone (ALDACTONE) 25  MG tablet Take 1 tablet (25 mg total) by mouth daily. (Patient taking differently: Take 25 mg by mouth at bedtime. ) 30 tablet 3   No current facility-administered medications on file prior to visit.     Allergies  Allergen Reactions  . No Known Allergies     Past Medical History:  Diagnosis Date  . Arthralgia of temporomandibular joint   . CAD, multiple vessel    a. cath 06/29/16: ostLM 40%, ostLAD 40%, pLAD 95%, ost-pLCx 60%, pLCx 95%, mLCx 60%, mRCA 95%, D2 50%, LVSF nl;  b. 07/2016 CABG x 4 (LIMA->LAD, VG->Diag, VG->OM, VG->RCA).  . Carotid arterial disease ([place])    a. 07/2016 s/p R CEA.  . Depression   . Diastolic dysfunction    a.e cho 06/28/16: EF 50-55%, mild inf wall HK, GR1DD, mild MR, RV sys fxn nl, mildly dilated LA, PASP nl  . Fatty liver disease, nonalcoholic [id number]  . HLD (hyperlipidemia)   . Malignant hypertension   . Obesity   . PTSD (post-traumatic stress disorder)   . Tobacco abuse   . Type 2 diabetes mellitus [facility]) January 2017    Past Surgical History:  Procedure Laterality Date  . CARDIAC CATHETERIZATION [name] 06/29/2016   Procedure: Left Heart Cath and Coronary Angiography;  Surgeon: Minna Merritts, MD;  Location: [facility];  Service: Cardiovascular;  Laterality: [name];  . CARDIAC CATHETERIZATION [name] 08/29/2016   Procedure: Left Heart Cath and Cors/Grafts Angiography;  Surgeon: Wellington Hampshire, MD;  Location: [facility];  Service: Cardiovascular;  Laterality: [name];  . CESAREAN SECTION    . CHOLECYSTECTOMY    . CORONARY ARTERY BYPASS GRAFT [name] 07/06/2016   Procedure: CORONARY ARTERY BYPASS GRAFTING (CABG) x four, using left internal mammary artery and right leg greater saphenous vein harvested endoscopically;  Surgeon: Ivin Poot, MD;  Location: [facility];  Service: Open Heart Surgery;  Laterality: [name];  . ENDARTERECTOMY Right 07/06/2016   Procedure: ENDARTERECTOMY CAROTID;  Surgeon: Rosetta Posner, MD;  Location: [facility];  Service: Vascular;  Laterality:  Right;  . PERIPHERAL VASCULAR CATHETERIZATION [name] 04/18/2016   Procedure: Renal Angiography;  Surgeon: Wellington Hampshire, MD;  Location: [facility];  Service: Cardiovascular;  Laterality: [name];  . TEE WITHOUT CARDIOVERSION [name] 07/06/2016   Procedure: TRANSESOPHAGEAL ECHOCARDIOGRAM (TEE);  Surgeon: Ivin Poot, MD;  Location: [facility];  Service: Open Heart Surgery;  Laterality: [name];  . TONSILLECTOMY      Family History  Problem Relation Age of Onset  . Adopted: Yes  . Diabetes Mother   . Diabetes Father   . Alcohol abuse Father   . Heart disease Father   . Drug abuse Father   . Stroke Sister   . Anxiety disorder Sister     Social History   Social History  . Marital status: Married    Spouse name: [name]  . Number of children: [name]  . Years of education: [name]   Occupational History  . Not on file.   Social History Main Topics  . Smoking status: Former Smoker    Packs/day: 0.50    Types: Cigarettes    Quit date: 06/28/2016  . Smokeless tobacco: Never Used  . Alcohol use No  . Drug use: No  . Sexual activity: Yes    Birth control/ protection: None   Other Topics Concern  . Not on file   Social History Narrative  . No narrative on file   The PMH, PSH, Social History, Family History, Medications, and allergies have been reviewed in [facility], and have been updated if relevant.   Review of Systems  Constitutional: Negative.   Eyes: Negative.   Respiratory: Negative.   Cardiovascular: Negative.   Musculoskeletal: Negative.   Neurological: Positive for headaches. Negative for dizziness, tremors, seizures, syncope, facial asymmetry, speech difficulty, weakness, light-headedness and numbness.  Hematological: Negative.   Psychiatric/Behavioral: Negative.   All other systems reviewed and are negative.      Objective:    BP (!) 150/90   Pulse 69   Temp 97.7 F (36.5 C) (Oral)   Wt 216 lb 4 oz (98.1 kg)   LMP 11/19/2016   SpO2 94%   BMI 33.87 kg/m    Physical  Exam  Constitutional: She is oriented to person, place, and time. She appears well-developed and well-nourished. No distress.  HENT:  Head: Normocephalic and atraumatic.  Eyes: Conjunctivae are normal.  Cardiovascular: Normal rate and regular rhythm.   Pulmonary/Chest: Effort normal.  Musculoskeletal: Normal range of motion.  Neurological: She is alert and oriented to person, place, and time. No cranial nerve deficit.  Skin: Skin is warm and dry. She is not diaphoretic.  Psychiatric: She has a normal mood and affect. Her behavior is normal. Judgment and thought  content normal.  Nursing note and vitals reviewed.         Assessment & Plan:   HTN (hypertension), malignant  Intractable cluster headache syndrome, unspecified chronicity pattern No Follow-up on file.

## 2016-12-04 NOTE — Assessment & Plan Note (Signed)
Deteriorated. Per Dr. Tyrell Antonio notes, he was planning on adding an ACEI or ARB next.  I am concerned about hyperkalemia with spironolactone.  I have sent a message to Dr. Fletcher Anon to ask which he would recommend.

## 2016-12-04 NOTE — Assessment & Plan Note (Signed)
Deteriorated. Will increase dose of Norco slightly, hopefully we will be able to decrease this once her BP is under better control. We also discussed possibly ordering rx oxygen as well.

## 2016-12-04 NOTE — Progress Notes (Signed)
Pre visit review using our clinic review tool, if applicable. No additional management support is needed unless otherwise documented below in the visit note. 

## 2016-12-05 ENCOUNTER — Encounter (HOSPITAL_COMMUNITY)
Admission: RE | Admit: 2016-12-05 | Discharge: 2016-12-05 | Disposition: A | Discharge status: Home / Self Care | Payer: BLUE CROSS/BLUE SHIELD | Source: Ambulatory Visit | Attending: Cardiovascular Disease | Admitting: Cardiovascular Disease

## 2016-12-05 DIAGNOSIS — Z951 Presence of aortocoronary bypass graft: Secondary | ICD-10-CM | POA: Diagnosis not present

## 2016-12-05 NOTE — Progress Notes (Signed)
Daily Session Note  Patient Details  Name: Erika [name] MRN: [id number] Date of Birth: 11/08/1971 Referring Provider:   Flowsheet Row CARDIAC REHAB PHASE II ORIENTATION from 09/13/2016 in [facility]  Referring Provider  Dr. Fletcher Anon      Encounter Date: 12/05/2016  Check In:     Session Check In - 12/05/16 0930      Check-In   Location [facility]   Staff Present Suzanne Boron, BS, EP, Exercise Physiologist;Camielle Sizer Wynetta Emery, RN, BSN   Supervising physician immediately available to respond to emergencies See telemetry face sheet for immediately available MD   Medication changes reported     Yes   Comments PCP added Losartan 50 MG qd.     Fall or balance concerns reported    No   Warm-up and Cool-down Performed as group-led instruction   Resistance Training Performed Yes   VAD Patient? No     Pain Assessment   Currently in Pain? No/denies   Pain Score 0-No pain   Multiple Pain Sites No      Capillary Blood Glucose: No results found for this or any previous visit (from the past 24 hour(s)).      Exercise Prescription Changes - 12/04/16 1100      Exercise Review   Progression Yes     Response to Exercise   Blood Pressure (Admit) 150/84   Blood Pressure (Exercise) 160/94   Blood Pressure (Exit) 120/80   Heart Rate (Admit) 68 bpm   Heart Rate (Exercise) 94 bpm   Heart Rate (Exit) 77 bpm   Rating of Perceived Exertion (Exercise) 11   Duration Progress to 30 minutes of continuous aerobic without signs/symptoms of physical distress   Intensity Rest + 30     Progression   Progression Continue progressive overload as per policy without signs/symptoms or physical distress.     Resistance Training   Training Prescription Yes   Weight 1   Reps 10-12     Treadmill   MPH 2   Grade 0   Minutes 15   METs 2.5     NuStep   Level 2   Watts 24   Minutes 20   METs 3.64     Home Exercise Plan   Plans to continue exercise at  Home   Frequency Add 2 additional days to program exercise sessions.     Goals Met:  Independence with exercise equipment Exercise tolerated well No report of cardiac concerns or symptoms Strength training completed today  Goals Unmet:  Not Applicable  Comments: Check out 1030.   Dr. Kate Sable is Medical Director for [facility].

## 2016-12-05 NOTE — Progress Notes (Signed)
Cardiac Individual Treatment Plan  Patient Details  Name: Erika [name] MRN: [id number] Date of Birth: 06-Apr-1971 Referring Provider:   Flowsheet Row CARDIAC REHAB PHASE II ORIENTATION from 09/13/2016 in [place]  Referring Provider  Dr. Fletcher Anon      Initial Encounter Date:  Flowsheet Row CARDIAC REHAB PHASE II ORIENTATION from 09/13/2016 in [place]  Date  09/13/16  Referring Provider  Dr. Fletcher Anon      Visit Diagnosis: S/P CABG x 4  Patient's Home Medications on Admission:  Current Outpatient Prescriptions:  .  aspirin EC 325 MG EC tablet, Take 1 tablet (325 mg total) by mouth daily., Disp: 30 tablet, Rfl: 0 .  atorvastatin (LIPITOR) 80 MG tablet, Take 1 tablet (80 mg total) by mouth daily at 6 PM., Disp: 90 tablet, Rfl: 2 .  carvedilol (COREG) 25 MG tablet, Take 1 tablet (25 mg total) by mouth 2 (two) times daily., Disp: 60 tablet, Rfl: 6 .  clonazePAM (KLONOPIN) 0.5 MG tablet, Take 1 tablet (0.5 mg total) by mouth 2 (two) times daily as needed for anxiety., Disp: 60 tablet, Rfl: 1 .  FLUoxetine HCl 60 MG TABS, Take 60 mg by mouth daily., Disp: 30 tablet, Rfl: 2 .  HYDROcodone-acetaminophen (NORCO) 7.5-325 MG tablet, Take 1 tablet by mouth every 6 (six) hours as needed for moderate pain., Disp: 30 tablet, Rfl: 0 .  isosorbide mononitrate (IMDUR) 30 MG 24 hr tablet, Take 1 tablet (30 mg total) by mouth daily., Disp: 30 tablet, Rfl: 6 .  lamoTRIgine (LAMICTAL) 200 MG tablet, Take 1 tablet (200 mg total) by mouth daily., Disp: 30 tablet, Rfl: 2 .  losartan (COZAAR) 50 MG tablet, Take 1 tablet (50 mg total) by mouth daily., Disp: 90 tablet, Rfl: 3 .  metFORMIN (GLUCOPHAGE) 500 MG tablet, Take 1 tablet (500 mg total) by mouth daily with breakfast., Disp: , Rfl:  .  nitroGLYCERIN (NITROSTAT) 0.4 MG SL tablet, Place 1 tablet (0.4 mg total) under the tongue every 5 (five) minutes as needed for chest pain., Disp: 30 tablet, Rfl: 0 .   spironolactone (ALDACTONE) 25 MG tablet, Take 1 tablet (25 mg total) by mouth daily. (Patient taking differently: Take 25 mg by mouth at bedtime. ), Disp: 30 tablet, Rfl: 3  Past Medical History: Past Medical History:  Diagnosis Date  . Arthralgia of temporomandibular joint   . CAD, multiple vessel    a. cath 06/29/16: ostLM 40%, ostLAD 40%, pLAD 95%, ost-pLCx 60%, pLCx 95%, mLCx 60%, mRCA 95%, D2 50%, LVSF nl;  b. 07/2016 CABG x 4 (LIMA->LAD, VG->Diag, VG->OM, VG->RCA).  . Carotid arterial disease ([place])    a. 07/2016 s/p R CEA.  . Depression   . Diastolic dysfunction    a.e cho 06/28/16: EF 50-55%, mild inf wall HK, GR1DD, mild MR, RV sys fxn nl, mildly dilated LA, PASP nl  . Fatty liver disease, nonalcoholic [id number]  . HLD (hyperlipidemia)   . Malignant hypertension   . Obesity   . PTSD (post-traumatic stress disorder)   . Tobacco abuse   . Type 2 diabetes mellitus [facility]) January 2017    Tobacco Use: History  Smoking Status  . Former Smoker  . Packs/day: 0.50  . Types: Cigarettes  . Quit date: 06/28/2016  Smokeless Tobacco  . Never Used    Labs: Recent Review Flowsheet Data    Labs for ITP Cardiac and Pulmonary Rehab Latest Ref Rng & Units 07/06/2016 07/07/2016 07/07/2016 08/21/2016 08/30/2016   Cholestrol 100 -  199 mg/dL - - - 121 -   LDLCALC 0 - 99 mg/dL - - - 64 -   LDLDIRECT mg/dL - - - - -   HDL >39 mg/dL - - - 24(L) -   Trlycerides 0 - 149 mg/dL - - - 164(H) -   Hemoglobin A1c 4.8 - 5.6 % - - - - 6.2(H)   PHART 7.350 - 7.450 - 7.376 - - -   PCO2ART 35.0 - 45.0 mmHg - 43.9 - - -   HCO3 20.0 - 24.0 mEq/L - 25.6(H) - - -   TCO2 0 - 100 mmol/L 26 27 27  - -   ACIDBASEDEF 0.0 - 2.0 mmol/L - - - - -   O2SAT % - 92.0 - - -      Capillary Blood Glucose: Lab Results  Component Value Date   GLUCAP 120 (H) 08/29/2016   GLUCAP 123 (H) 07/13/2016   GLUCAP 148 (H) 07/12/2016   GLUCAP 127 (H) 07/12/2016   GLUCAP 147 (H) 07/12/2016     Exercise Target Goals:    Exercise Program  Goal: Individual exercise prescription set with THRR, safety & activity barriers. Participant demonstrates ability to understand and report RPE using BORG scale, to self-measure pulse accurately, and to acknowledge the importance of the exercise prescription.  Exercise Prescription Goal: Starting with aerobic activity 30 plus minutes a day, 3 days per week for initial exercise prescription. Provide home exercise prescription and guidelines that participant acknowledges understanding prior to discharge.  Activity Barriers & Risk Stratification:     Activity Barriers & Cardiac Risk Stratification - 09/13/16 1358      Activity Barriers & Cardiac Risk Stratification   Activity Barriers None  Her anxiety medication if taken before coming to class will make her foggy and drowsy.   Cardiac Risk Stratification High      6 Minute Walk:     6 Minute Walk    Row Name 09/13/16 1437         6 Minute Walk   Phase Initial     Distance 1000 feet     Walk Time 6 minutes     # of Rest Breaks 0     MPH 1.89     METS 2.45     RPE 15     Perceived Dyspnea  13     VO2 Peak 13.86     Symptoms No     Resting HR 86 bpm     Resting BP 152/96     Max Ex. HR 107 bpm     Max Ex. BP 160/108     2 Minute Post BP 160/90        Initial Exercise Prescription:     Initial Exercise Prescription - 09/13/16 1400      Date of Initial Exercise RX and Referring Provider   Date 09/13/16   Referring Provider Dr. Fletcher Anon     Treadmill   MPH 1.3   Grade 0   Minutes 20   METs 1.9     NuStep   Level 2   Watts 15   Minutes 15   METs 1.9     Prescription Details   Frequency (times per week) 3   Duration Progress to 30 minutes of continuous aerobic without signs/symptoms of physical distress     Intensity   THRR REST +  30   THRR 40-80% of Max Heartrate 122-141-160   Ratings of Perceived Exertion 11-13   Perceived Dyspnea 0-4  Progression   Progression Continue progressive overload as per  policy without signs/symptoms or physical distress.     Resistance Training   Training Prescription Yes   Weight 1   Reps 10-12      Perform Capillary Blood Glucose checks as needed.  Exercise Prescription Changes:      Exercise Prescription Changes    Row Name 12/04/16 1100             Exercise Review   Progression Yes         Response to Exercise   Blood Pressure (Admit) 150/84       Blood Pressure (Exercise) 160/94       Blood Pressure (Exit) 120/80       Heart Rate (Admit) 68 bpm       Heart Rate (Exercise) 94 bpm       Heart Rate (Exit) 77 bpm       Rating of Perceived Exertion (Exercise) 11       Duration Progress to 30 minutes of continuous aerobic without signs/symptoms of physical distress       Intensity Rest + 30         Progression   Progression Continue progressive overload as per policy without signs/symptoms or physical distress.         Resistance Training   Training Prescription Yes       Weight 1       Reps 10-12         Treadmill   MPH 2       Grade 0       Minutes 15       METs 2.5         NuStep   Level 2       Watts 24       Minutes 20       METs 3.64         Home Exercise Plan   Plans to continue exercise at Home       Frequency Add 2 additional days to program exercise sessions.          Exercise Comments:      Exercise Comments    Row Name 12/04/16 1136           Exercise Comments The patient is being progressed appropriately.            Discharge Exercise Prescription (Final Exercise Prescription Changes):     Exercise Prescription Changes - 12/04/16 1100      Exercise Review   Progression Yes     Response to Exercise   Blood Pressure (Admit) 150/84   Blood Pressure (Exercise) 160/94   Blood Pressure (Exit) 120/80   Heart Rate (Admit) 68 bpm   Heart Rate (Exercise) 94 bpm   Heart Rate (Exit) 77 bpm   Rating of Perceived Exertion (Exercise) 11   Duration Progress to 30 minutes of continuous aerobic  without signs/symptoms of physical distress   Intensity Rest + 30     Progression   Progression Continue progressive overload as per policy without signs/symptoms or physical distress.     Resistance Training   Training Prescription Yes   Weight 1   Reps 10-12     Treadmill   MPH 2   Grade 0   Minutes 15   METs 2.5     NuStep   Level 2   Watts 24   Minutes 20   METs 3.64     Home  Exercise Plan   Plans to continue exercise at Home   Frequency Add 2 additional days to program exercise sessions.      Nutrition:  Target Goals: Understanding of nutrition guidelines, daily intake of sodium 1500mg , cholesterol 200mg , calories 30% from fat and 7% or less from saturated fats, daily to have 5 or more servings of fruits and vegetables.  Biometrics:     Pre Biometrics - 09/13/16 1439      Pre Biometrics   Height 5\' 7"  (1.702 m)   Weight 202 lb 6.1 oz (91.8 kg)   Waist Circumference 41 inches   Hip Circumference 44 inches   Waist to Hip Ratio 0.93 %   BMI (Calculated) 31.8   Triceps Skinfold 16 mm   % Body Fat 37.8 %   Grip Strength 64 kg   Flexibility 11.5 in   Single Leg Stand 10 seconds       Nutrition Therapy Plan and Nutrition Goals:   Nutrition Discharge: Rate Your Plate Scores:     Nutrition Assessments - 09/13/16 1402      MEDFICTS Scores   Pre Score 36      Nutrition Goals Re-Evaluation:   Psychosocial: Target Goals: Acknowledge presence or absence of depression, maximize coping skills, provide positive support system. Participant is able to verbalize types and ability to use techniques and skills needed for reducing stress and depression.  Initial Review & Psychosocial Screening:     Initial Psych Review & Screening - 09/13/16 1439      Initial Review   Current issues with Current Depression;History of Depression;Current Anxiety/Panic;Current Stress Concerns  PTSD   Source of Stress Concerns Chronic Illness   Comments Patient is  currently in counseling for her depression.      Family Dynamics   Good Support System? Yes     Barriers   Psychosocial barriers to participate in program The patient should benefit from training in stress management and relaxation.;Psychosocial barriers identified (see note)  Patient QOL score 12.63 which is low and show depression. Patient is oan medication for depression and currently in counseling.      Screening Interventions   Interventions Encouraged to exercise      Quality of Life Scores:     Quality of Life - 09/13/16 1443      Quality of Life Scores   Health/Function Pre 10.2 %   Socioeconomic Pre 10.06 %   Psych/Spiritual Pre 10.93 %   Family Pre 26.4 %   GLOBAL Pre 12.63 %      PHQ-9: Recent Review Flowsheet Data    Depression screen [facility] 2/9 09/13/2016   Decreased Interest 1   Down, Depressed, Hopeless 3   PHQ - 2 Score 4   Altered sleeping 3   Tired, decreased energy 3   Change in appetite 3   Feeling bad or failure about yourself  3   Trouble concentrating 3   Moving slowly or fidgety/restless 3   Suicidal thoughts 1   PHQ-9 Score 23   Difficult doing work/chores Extremely dIfficult      Psychosocial Evaluation and Intervention:     Psychosocial Evaluation - 09/13/16 1441      Psychosocial Evaluation & Interventions   Interventions Stress management education;Relaxation education;Encouraged to exercise with the program and follow exercise prescription   Comments Already in counseling.    Continued Psychosocial Services Needed Yes      Psychosocial Re-Evaluation:     Psychosocial Re-Evaluation    [place] Name 12/05/16 1344  Psychosocial Re-Evaluation   Interventions Encouraged to attend Cardiac Rehabilitation for the exercise;Stress management education       Comments Patient's QOL was 12.63 and her PHQ-9 score was 23. She is currently seeing a counselor and is on anti-anxiety medication.       Continued Psychosocial Services  Needed Yes          Vocational Rehabilitation: Provide vocational rehab assistance to qualifying candidates.   Vocational Rehab Evaluation & Intervention:     Vocational Rehab - 09/13/16 1400      Initial Vocational Rehab Evaluation & Intervention   Assessment shows need for Vocational Rehabilitation No      Education: Education Goals: Education classes will be provided on a weekly basis, covering required topics. Participant will state understanding/return demonstration of topics presented.  Learning Barriers/Preferences:     Learning Barriers/Preferences - 09/13/16 1359      Learning Barriers/Preferences   Learning Barriers None   Learning Preferences Verbal Instruction;Skilled Demonstration      Education Topics: Hypertension, Hypertension Reduction -Define heart disease and high blood pressure. Discus how high blood pressure affects the body and ways to reduce high blood pressure. Flowsheet Row CARDIAC REHAB PHASE II EXERCISE from 11/21/2016 in [place]  Date  10/24/16  Educator  DC  Instruction Review Code  2- meets goals/outcomes      Exercise and Your Heart -Discuss why it is important to exercise, the FITT principles of exercise, normal and abnormal responses to exercise, and how to exercise safely. Flowsheet Row CARDIAC REHAB PHASE II EXERCISE from 11/21/2016 in [place]  Date  10/31/16  Educator  Russella Dar  Instruction Review Code  2- meets goals/outcomes      Angina -Discuss definition of angina, causes of angina, treatment of angina, and how to decrease risk of having angina.   Cardiac Medications -Review what the following cardiac medications are used for, how they affect the body, and side effects that may occur when taking the medications.  Medications include Aspirin, Beta blockers, calcium channel blockers, ACE Inhibitors, angiotensin receptor blockers, diuretics, digoxin, and  antihyperlipidemics.   Congestive Heart Failure -Discuss the definition of CHF, how to live with CHF, the signs and symptoms of CHF, and how keep track of weight and sodium intake. Flowsheet Row CARDIAC REHAB PHASE II EXERCISE from 11/21/2016 in [place]  Date  11/21/16  Educator  Lisabeth Register  Instruction Review Code  2- meets goals/outcomes      Heart Disease and Intimacy -Discus the effect sexual activity has on the heart, how changes occur during intimacy as we age, and safety during sexual activity.   Smoking Cessation / COPD -Discuss different methods to quit smoking, the health benefits of quitting smoking, and the definition of COPD.   Nutrition I: Fats -Discuss the types of cholesterol, what cholesterol does to the heart, and how cholesterol levels can be controlled.   Nutrition II: Labels -Discuss the different components of food labels and how to read food label [place] from 11/21/2016 in [place]  Date  09/19/16  Educator  Russella Dar  Instruction Review Code  2- meets goals/outcomes      Heart Parts and Heart Disease -Discuss the anatomy of the heart, the pathway of blood circulation through the heart, and these are affected by heart disease. Flowsheet Row CARDIAC REHAB PHASE II EXERCISE from 11/21/2016 in [place]  Date  09/26/16  Educator  DC  Instruction Review Code  2- meets goals/outcomes      Stress I: Signs and Symptoms -Discuss the causes of stress, how stress may lead to anxiety and depression, and ways to limit stress. Flowsheet Row CARDIAC REHAB PHASE II EXERCISE from 11/21/2016 in [place]  Date  10/03/16  Educator  Russella Dar  Instruction Review Code  2- meets goals/outcomes      Stress II: Relaxation -Discuss different types of relaxation techniques to limit stress. Flowsheet Row CARDIAC REHAB PHASE  II EXERCISE from 11/21/2016 in [place]  Date  10/10/16  Educator  Russella Dar  Instruction Review Code  2- meets goals/outcomes      Warning Signs of Stroke / TIA -Discuss definition of a stroke, what the signs and symptoms are of a stroke, and how to identify when someone is having stroke. Flowsheet Row CARDIAC REHAB PHASE II EXERCISE from 11/21/2016 in [place]  Date  10/17/16  Educator  DJ  Instruction Review Code  2- meets goals/outcomes      Knowledge Questionnaire Score:     Knowledge Questionnaire Score - 09/13/16 1359      Knowledge Questionnaire Score   Pre Score 23/28      Core Components/Risk Factors/Patient Goals at Admission:     Personal Goals and Risk Factors at Admission - 09/13/16 1428      Core Components/Risk Factors/Patient Goals on Admission    Weight Management Weight Maintenance   Increase Strength and Stamina Yes   Intervention Provide advice, education, support and counseling about physical activity/exercise needs.;Develop an individualized exercise prescription for aerobic and resistive training based on initial evaluation findings, risk stratification, comorbidities and participant's personal goals.   Expected Outcomes Achievement of increased cardiorespiratory fitness and enhanced flexibility, muscular endurance and strength shown through measurements of functional capacity and personal statement of participant.   Tobacco Cessation Yes   Intervention Assist the participant in steps to quit. Provide individualized education and counseling about committing to Tobacco Cessation, relapse prevention, and pharmacological support that can be provided by physician.;Advice worker, assist with locating and accessing local/national Quit Smoking programs, and support quit date choice.   Expected Outcomes Long Term: Complete abstinence from all tobacco products for at least 12 months from quit  date.;Short Term: Will demonstrate readiness to quit, by selecting a quit date.   Diabetes Yes   Intervention Provide education about signs/symptoms and action to take for hypo/hyperglycemia.   Expected Outcomes Long Term: Attainment of HbA1C < 7%.;Short Term: Participant verbalizes understanding of the signs/symptoms and immediate care of hyper/hypoglycemia, proper foot care and importance of medication, aerobic/resistive exercise and nutrition plan for blood glucose control.   Hypertension Yes   Intervention Monitor prescription use compliance.;Provide education on lifestyle modifcations including regular physical activity/exercise, weight management, moderate sodium restriction and increased consumption of fresh fruit, vegetables, and low fat dairy, alcohol moderation, and smoking cessation.   Expected Outcomes Short Term: Continued assessment and intervention until BP is < 140/45mm HG in hypertensive participants. < 130/76mm HG in hypertensive participants with diabetes, heart failure or chronic kidney disease.;Long Term: Maintenance of blood pressure at goal levels.   Personal Goal Other Yes   Personal Goal Get back to regular intimacy with husband, get back to normal ADL's   Intervention Attend CR 3 x week and supplement with 2 x week at home.    Expected Outcomes To reach persoanl goals.       Core  Components/Risk Factors/Patient Goals Review:      Goals and Risk Factor Review    Row Name 09/13/16 1438 12/05/16 1339           Core Components/Risk Factors/Patient Goals Review   Personal Goals Review Tobacco Cessation;Increase Strength and Stamina;Hypertension Weight Management/Obesity;Increase Strength and Stamina;Tobacco Cessation;Hypertension  Get back to regular intimacy with husband, get back to normal ADL's      Review  - Patient has attended 23 sessions gaining 8 lbs since starting the program. She continue to smoke. She continues to have elevated B/P both intially and during  exercise. We have ended her session early x's 2 d/t hypertension. Her PCP added Cozaar 50 mg qd 12/03/16. She has progressed some with some increased strength and stamina.       Expected Outcomes  - Patient will continue to attend sessions meeting her personal goals.          Core Components/Risk Factors/Patient Goals at Discharge (Final Review):      Goals and Risk Factor Review - 12/05/16 1339      Core Components/Risk Factors/Patient Goals Review   Personal Goals Review Weight Management/Obesity;Increase Strength and Stamina;Tobacco Cessation;Hypertension  Get back to regular intimacy with husband, get back to normal ADL's   Review Patient has attended 23 sessions gaining 8 lbs since starting the program. She continue to smoke. She continues to have elevated B/P both intially and during exercise. We have ended her session early x's 2 d/t hypertension. Her PCP added Cozaar 50 mg qd 12/03/16. She has progressed some with some increased strength and stamina.    Expected Outcomes Patient will continue to attend sessions meeting her personal goals.       ITP Comments:   Comments: ITP 30 Day REVIEW Patient is progress some in the program. Will continue to monitor for progress.

## 2016-12-07 ENCOUNTER — Encounter (HOSPITAL_COMMUNITY)
Admission: RE | Admit: 2016-12-07 | Discharge: 2016-12-07 | Disposition: A | Discharge status: Home / Self Care | Payer: BLUE CROSS/BLUE SHIELD | Source: Ambulatory Visit | Attending: Cardiovascular Disease | Admitting: Cardiovascular Disease

## 2016-12-07 DIAGNOSIS — Z951 Presence of aortocoronary bypass graft: Secondary | ICD-10-CM

## 2016-12-07 NOTE — Progress Notes (Signed)
Daily Session Note  Patient Details  Name: TALIANA MERSEREAU MRN: [id number] Date of Birth: 1971/05/31 Referring Provider:   Flowsheet Row CARDIAC REHAB PHASE II ORIENTATION from 09/13/2016 in [place]  Referring Provider  Dr. Fletcher Anon      Encounter Date: 12/07/2016  Check In:     Session Check In - 12/07/16 0930      Check-In   Location [facility]   Staff Present Suzanne Boron, BS, EP, Exercise Physiologist;Jasmeen Fritsch Wynetta Emery, RN, BSN   Supervising physician immediately available to respond to emergencies See telemetry face sheet for immediately available MD   Medication changes reported     No   Fall or balance concerns reported    No   Warm-up and Cool-down Performed as group-led instruction   Resistance Training Performed No   VAD Patient? No     Pain Assessment   Currently in Pain? No/denies   Pain Score 0-No pain   Multiple Pain Sites No      Capillary Blood Glucose: No results found for this or any previous visit (from the past 24 hour(s)).   Goals Met:  Independence with exercise equipment Exercise tolerated well No report of cardiac concerns or symptoms Strength training completed today  Goals Unmet:  Not Applicable  Comments: Check out 1030.   Dr. Kate Sable is Medical Director for [facility] Cardiac and Pulmonary Rehab.

## 2016-12-10 ENCOUNTER — Encounter (HOSPITAL_COMMUNITY)
Admission: RE | Admit: 2016-12-10 | Discharge: 2016-12-10 | Disposition: A | Discharge status: Home / Self Care | Payer: BLUE CROSS/BLUE SHIELD | Source: Ambulatory Visit | Attending: Cardiovascular Disease | Admitting: Cardiovascular Disease

## 2016-12-10 DIAGNOSIS — Z951 Presence of aortocoronary bypass graft: Secondary | ICD-10-CM

## 2016-12-10 NOTE — Progress Notes (Signed)
Daily Session Note  Patient Details  Name: Erika [name] MRN: [id number] Date of Birth: 1971-11-18 Referring Provider:   Flowsheet Row CARDIAC REHAB PHASE II ORIENTATION from 09/13/2016 in [place]  Referring Provider  Dr. Fletcher Anon      Encounter Date: 12/10/2016  Check In:     Session Check In - 12/10/16 0930      Check-In   Location [facility]   Staff Present Suzanne Boron, BS, EP, Exercise Physiologist;Jeyli Zwicker Wynetta Emery, RN, BSN   Supervising physician immediately available to respond to emergencies See telemetry face sheet for immediately available MD   Medication changes reported     No   Fall or balance concerns reported    No   Warm-up and Cool-down Performed as group-led instruction   Resistance Training Performed Yes   VAD Patient? No     Pain Assessment   Currently in Pain? No/denies   Pain Score 0-No pain   Multiple Pain Sites No      Capillary Blood Glucose: No results found for this or any previous visit (from the past 24 hour(s)).   Goals Met:  Independence with exercise equipment Exercise tolerated well No report of cardiac concerns or symptoms Strength training completed today  Goals Unmet:  Not Applicable  Comments: Check out 1030.   Dr. Kate Sable is Medical Director for [facility] Cardiac and Pulmonary Rehab.

## 2016-12-11 ENCOUNTER — Ambulatory Visit: Payer: Self-pay | Admitting: Family Medicine

## 2016-12-11 ENCOUNTER — Other Ambulatory Visit (INDEPENDENT_AMBULATORY_CARE_PROVIDER_SITE_OTHER): Payer: BLUE CROSS/BLUE SHIELD

## 2016-12-11 DIAGNOSIS — I10 Essential (primary) hypertension: Secondary | ICD-10-CM | POA: Diagnosis not present

## 2016-12-11 LAB — BASIC METABOLIC PANEL
BUN: 8 mg/dL (ref 6–23)
CHLORIDE: 100 meq/L (ref 96–112)
CO2: 29 mEq/L (ref 19–32)
Calcium: 10.3 mg/dL (ref 8.4–10.5)
Creatinine, Ser: 0.68 mg/dL (ref 0.40–1.20)
GFR: 100.36 mL/min (ref >60.00)
Glucose, Bld: 113 mg/dL — ABNORMAL HIGH (ref 70–99)
POTASSIUM: 4.1 meq/L (ref 3.5–5.1)
Sodium: 136 mEq/L (ref 135–145)

## 2016-12-12 ENCOUNTER — Encounter (HOSPITAL_COMMUNITY)
Admission: RE | Admit: 2016-12-12 | Discharge: 2016-12-12 | Disposition: A | Discharge status: Home / Self Care | Payer: BLUE CROSS/BLUE SHIELD | Source: Ambulatory Visit | Attending: Cardiovascular Disease | Admitting: Cardiovascular Disease

## 2016-12-12 DIAGNOSIS — M9902 Segmental and somatic dysfunction of thoracic region: Secondary | ICD-10-CM | POA: Diagnosis not present

## 2016-12-12 DIAGNOSIS — M9905 Segmental and somatic dysfunction of pelvic region: Secondary | ICD-10-CM | POA: Diagnosis not present

## 2016-12-12 DIAGNOSIS — M9903 Segmental and somatic dysfunction of lumbar region: Secondary | ICD-10-CM | POA: Diagnosis not present

## 2016-12-12 DIAGNOSIS — Z951 Presence of aortocoronary bypass graft: Secondary | ICD-10-CM

## 2016-12-12 DIAGNOSIS — M545 Low back pain: Secondary | ICD-10-CM | POA: Diagnosis not present

## 2016-12-12 NOTE — Progress Notes (Signed)
Daily Session Note  Patient Details  Name: Erika [name] MRN: [id number] Date of Birth: Jul 19, 1971 Referring Provider:   Flowsheet Row CARDIAC REHAB PHASE II ORIENTATION from 09/13/2016 in [place]  Referring Provider  Dr. Fletcher Anon      Encounter Date: 12/12/2016  Check In:     Session Check In - 12/12/16 0930      Check-In   Location [facility]   Staff Present Diane Angelina Pih, MS, EP, [facility], Exercise Physiologist;Gregory Luther Parody, BS, EP, Exercise Physiologist;Madilyn Cephas Wynetta Emery, RN, BSN   Supervising physician immediately available to respond to emergencies See telemetry face sheet for immediately available MD   Medication changes reported     No   Fall or balance concerns reported    No   Warm-up and Cool-down Performed as group-led instruction   Resistance Training Performed Yes   VAD Patient? No     Pain Assessment   Currently in Pain? No/denies   Pain Score 0-No pain   Multiple Pain Sites No      Capillary Blood Glucose: Results for orders placed or performed in visit on 12/11/16 (from the past 24 hour(s))  Basic metabolic panel     Status: Abnormal   Collection Time: 12/11/16 11:12 AM  Result Value Ref Range   Sodium 136 135 - 145 mEq/L   Potassium 4.1 3.5 - 5.1 mEq/L   Chloride 100 96 - 112 mEq/L   CO2 29 19 - 32 mEq/L   Glucose, Bld 113 (H) 70 - 99 mg/dL   BUN 8 6 - 23 mg/dL   Creatinine, Ser 0.68 0.40 - 1.20 mg/dL   Calcium 10.3 8.4 - 10.5 mg/dL   GFR 100.36 >60.00 mL/min     Goals Met:  Independence with exercise equipment Exercise tolerated well No report of cardiac concerns or symptoms Strength training completed today  Goals Unmet:  Not Applicable  Comments: Check out 1030.   Dr. Kate Sable is Medical Director for [facility] Cardiac and Pulmonary Rehab.

## 2016-12-14 ENCOUNTER — Encounter (HOSPITAL_COMMUNITY)
Admission: RE | Admit: 2016-12-14 | Discharge: 2016-12-14 | Disposition: A | Discharge status: Home / Self Care | Payer: BLUE CROSS/BLUE SHIELD | Source: Ambulatory Visit | Attending: Cardiovascular Disease | Admitting: Cardiovascular Disease

## 2016-12-14 DIAGNOSIS — Z951 Presence of aortocoronary bypass graft: Secondary | ICD-10-CM

## 2016-12-14 NOTE — Progress Notes (Signed)
Daily Session Note  Patient Details  Name: Erika [name] MRN: [id number] Date of Birth: 11/25/1971 Referring Provider:   Flowsheet Row CARDIAC REHAB PHASE II ORIENTATION from 09/13/2016 in [place]  Referring Provider  Dr. Fletcher Anon      Encounter Date: 12/14/2016  Check In:     Session Check In - 12/14/16 0930      Check-In   Location [facility]   Staff Present Suzanne Boron, BS, EP, Exercise Physiologist;Arlet Marter Wynetta Emery, RN, BSN   Supervising physician immediately available to respond to emergencies See telemetry face sheet for immediately available MD   Medication changes reported     No   Fall or balance concerns reported    No   Warm-up and Cool-down Performed as group-led instruction   Resistance Training Performed No   VAD Patient? No     Pain Assessment   Currently in Pain? No/denies   Pain Score 0-No pain   Multiple Pain Sites No      Capillary Blood Glucose: No results found for this or any previous visit (from the past 24 hour(s)).   Goals Met:  Independence with exercise equipment Exercise tolerated well No report of cardiac concerns or symptoms Strength training completed today  Goals Unmet:  Not Applicable  Comments: Check out 1030.   Dr. Kate Sable is Medical Director for [facility] Cardiac and Pulmonary Rehab.

## 2016-12-17 ENCOUNTER — Encounter (HOSPITAL_COMMUNITY): Payer: BLUE CROSS/BLUE SHIELD

## 2016-12-19 ENCOUNTER — Encounter (HOSPITAL_COMMUNITY)
Admission: RE | Admit: 2016-12-19 | Payer: BLUE CROSS/BLUE SHIELD | Source: Ambulatory Visit | Attending: Cardiovascular Disease | Admitting: Cardiovascular Disease

## 2016-12-21 ENCOUNTER — Encounter (HOSPITAL_COMMUNITY): Payer: BLUE CROSS/BLUE SHIELD

## 2016-12-24 ENCOUNTER — Encounter (HOSPITAL_COMMUNITY): Payer: BLUE CROSS/BLUE SHIELD

## 2016-12-26 ENCOUNTER — Encounter (HOSPITAL_COMMUNITY): Payer: BLUE CROSS/BLUE SHIELD

## 2016-12-28 ENCOUNTER — Encounter (HOSPITAL_COMMUNITY)
Admission: RE | Admit: 2016-12-28 | Payer: BLUE CROSS/BLUE SHIELD | Source: Ambulatory Visit | Attending: Cardiovascular Disease | Admitting: Cardiovascular Disease

## 2016-12-31 ENCOUNTER — Encounter (HOSPITAL_COMMUNITY)
Admission: RE | Admit: 2016-12-31 | Discharge: 2016-12-31 | Disposition: A | Discharge status: Home / Self Care | Payer: BLUE CROSS/BLUE SHIELD | Source: Ambulatory Visit | Attending: Cardiovascular Disease | Admitting: Cardiovascular Disease

## 2016-12-31 DIAGNOSIS — Z951 Presence of aortocoronary bypass graft: Secondary | ICD-10-CM | POA: Insufficient documentation

## 2016-12-31 NOTE — Progress Notes (Signed)
Daily Session Note  Patient Details  Name: Erika [name] MRN: [id number] Date of Birth: 10/06/1971 Referring Provider:   Flowsheet Row CARDIAC REHAB PHASE II ORIENTATION from 09/13/2016 in [place] CARDIAC RE[facility]ATION  Referring Provider  Dr. Arida      Encounter Date: 12/31/2016  Check In:     Session Check In - 12/31/16 0930      Check-In   Location [facility]   Staff Present Gregory Cowan, BS, EP, Exercise Physiologist; , RN, BSN   Supervising physician immediately available to respond to emergencies See telemetry face sheet for immediately available MD   Medication changes reported     No   Fall or balance concerns reported    No   Warm-up and Cool-down Performed as group-led instruction   Resistance Training Performed Yes   VAD Patient? No     Pain Assessment   Currently in Pain? No/denies   Pain Score 0-No pain   Multiple Pain Sites No      Capillary Blood Glucose: No results found for this or any previous visit (from the past 24 hour(s)).   Goals Met:  Independence with exercise equipment Exercise tolerated well No report of cardiac concerns or symptoms Strength training completed today  Goals Unmet:  Not Applicable  Comments: Check out 1030.   Dr. Suresh Koneswaran is Medical Director for [place] Cardiac and Pulmonary Rehab. 

## 2017-01-02 ENCOUNTER — Encounter (HOSPITAL_COMMUNITY)
Admission: RE | Admit: 2017-01-02 | Discharge: 2017-01-02 | Disposition: A | Discharge status: Home / Self Care | Payer: BLUE CROSS/BLUE SHIELD | Source: Ambulatory Visit | Attending: Cardiovascular Disease | Admitting: Cardiovascular Disease

## 2017-01-02 DIAGNOSIS — M9903 Segmental and somatic dysfunction of lumbar region: Secondary | ICD-10-CM | POA: Diagnosis not present

## 2017-01-02 DIAGNOSIS — M9905 Segmental and somatic dysfunction of pelvic region: Secondary | ICD-10-CM | POA: Diagnosis not present

## 2017-01-02 DIAGNOSIS — Z951 Presence of aortocoronary bypass graft: Secondary | ICD-10-CM

## 2017-01-02 DIAGNOSIS — M9902 Segmental and somatic dysfunction of thoracic region: Secondary | ICD-10-CM | POA: Diagnosis not present

## 2017-01-02 DIAGNOSIS — M545 Low back pain: Secondary | ICD-10-CM | POA: Diagnosis not present

## 2017-01-02 NOTE — Progress Notes (Signed)
Daily Session Note  Patient Details  Name: DAMIEN CISAR MRN: [id number] Date of Birth: 06/16/1971 Referring Provider:   Flowsheet Row CARDIAC REHAB PHASE II ORIENTATION from 09/13/2016 in [place]  Referring Provider  Dr. Fletcher Anon      Encounter Date: 01/02/2017  Check In:     Session Check In - 01/02/17 0930      Check-In   Location [facility]   Staff Present Suzanne Boron, BS, EP, Exercise Physiologist;Colleena Kurtenbach Wynetta Emery, RN, BSN   Supervising physician immediately available to respond to emergencies See telemetry face sheet for immediately available MD   Medication changes reported     No   Fall or balance concerns reported    No   Warm-up and Cool-down Performed as group-led instruction   Resistance Training Performed Yes   VAD Patient? No     Pain Assessment   Currently in Pain? No/denies   Pain Score 0-No pain   Multiple Pain Sites No      Capillary Blood Glucose: No results found for this or any previous visit (from the past 24 hour(s)).   Goals Met:  Independence with exercise equipment Exercise tolerated well No report of cardiac concerns or symptoms Strength training completed today  Goals Unmet:  Not Applicable  Comments: Check out 1030.   Dr. Kate Sable is Medical Director for [facility] Cardiac and Pulmonary Rehab.

## 2017-01-03 NOTE — Progress Notes (Signed)
Cardiac Individual Treatment Plan  Patient Details  Name: Erika [name] MRN: [id number] Date of Birth: 1971/12/19 Referring Provider:   Flowsheet Row CARDIAC REHAB PHASE II ORIENTATION from 09/13/2016 in [facility]  Referring Provider  Dr. Fletcher Anon      Initial Encounter Date:  Flowsheet Row CARDIAC REHAB PHASE II ORIENTATION from 09/13/2016 in [facility]  Date  09/13/16  Referring Provider  Dr. Fletcher Anon      Visit Diagnosis: S/P CABG x 4  Patient's Home Medications on Admission:  Current Outpatient Prescriptions:  .  aspirin EC 325 MG EC tablet, Take 1 tablet (325 mg total) by mouth daily., Disp: 30 tablet, Rfl: 0 .  atorvastatin (LIPITOR) 80 MG tablet, Take 1 tablet (80 mg total) by mouth daily at 6 PM., Disp: 90 tablet, Rfl: 2 .  carvedilol (COREG) 25 MG tablet, Take 1 tablet (25 mg total) by mouth 2 (two) times daily., Disp: 60 tablet, Rfl: 6 .  clonazePAM (KLONOPIN) 0.5 MG tablet, Take 1 tablet (0.5 mg total) by mouth 2 (two) times daily as needed for anxiety., Disp: 60 tablet, Rfl: 1 .  FLUoxetine HCl 60 MG TABS, Take 60 mg by mouth daily., Disp: 30 tablet, Rfl: 2 .  HYDROcodone-acetaminophen (NORCO) 7.5-325 MG tablet, Take 1 tablet by mouth every 6 (six) hours as needed for moderate pain., Disp: 30 tablet, Rfl: 0 .  isosorbide mononitrate (IMDUR) 30 MG 24 hr tablet, Take 1 tablet (30 mg total) by mouth daily., Disp: 30 tablet, Rfl: 6 .  lamoTRIgine (LAMICTAL) 200 MG tablet, Take 1 tablet (200 mg total) by mouth daily., Disp: 30 tablet, Rfl: 2 .  losartan (COZAAR) 50 MG tablet, Take 1 tablet (50 mg total) by mouth daily., Disp: 90 tablet, Rfl: 3 .  metFORMIN (GLUCOPHAGE) 500 MG tablet, Take 1 tablet (500 mg total) by mouth daily with breakfast., Disp: , Rfl:  .  nitroGLYCERIN (NITROSTAT) 0.4 MG SL tablet, Place 1 tablet (0.4 mg total) under the tongue every 5 (five) minutes as needed for chest pain., Disp: 30 tablet, Rfl: 0 .   spironolactone (ALDACTONE) 25 MG tablet, Take 1 tablet (25 mg total) by mouth daily. (Patient taking differently: Take 25 mg by mouth at bedtime. ), Disp: 30 tablet, Rfl: 3  Past Medical History: Past Medical History:  Diagnosis Date  . Arthralgia of temporomandibular joint   . CAD, multiple vessel    a. cath 06/29/16: ostLM 40%, ostLAD 40%, pLAD 95%, ost-pLCx 60%, pLCx 95%, mLCx 60%, mRCA 95%, D2 50%, LVSF nl;  b. 07/2016 CABG x 4 (LIMA->LAD, VG->Diag, VG->OM, VG->RCA).  . Carotid arterial disease ([place])    a. 07/2016 s/p R CEA.  . Depression   . Diastolic dysfunction    a.e cho 06/28/16: EF 50-55%, mild inf wall HK, GR1DD, mild MR, RV sys fxn nl, mildly dilated LA, PASP nl  . Fatty liver disease, nonalcoholic [id number]  . HLD (hyperlipidemia)   . Malignant hypertension   . Obesity   . PTSD (post-traumatic stress disorder)   . Tobacco abuse   . Type 2 diabetes mellitus [facility]) January 2017    Tobacco Use: History  Smoking Status  . Former Smoker  . Packs/day: 0.50  . Types: Cigarettes  . Quit date: 06/28/2016  Smokeless Tobacco  . Never Used    Labs: Recent Review Flowsheet Data    Labs for ITP Cardiac and Pulmonary Rehab Latest Ref Rng & Units 07/06/2016 07/07/2016 07/07/2016 08/21/2016 08/30/2016   Cholestrol 100 -  199 mg/dL - - - 121 -   LDLCALC 0 - 99 mg/dL - - - 64 -   LDLDIRECT mg/dL - - - - -   HDL >39 mg/dL - - - 24(L) -   Trlycerides 0 - 149 mg/dL - - - 164(H) -   Hemoglobin A1c 4.8 - 5.6 % - - - - 6.2(H)   PHART 7.350 - 7.450 - 7.376 - - -   PCO2ART 35.0 - 45.0 mmHg - 43.9 - - -   HCO3 20.0 - 24.0 mEq/L - 25.6(H) - - -   TCO2 0 - 100 mmol/L 26 27 27  - -   ACIDBASEDEF 0.0 - 2.0 mmol/L - - - - -   O2SAT % - 92.0 - - -      Capillary Blood Glucose: Lab Results  Component Value Date   GLUCAP 120 (H) 08/29/2016   GLUCAP 123 (H) 07/13/2016   GLUCAP 148 (H) 07/12/2016   GLUCAP 127 (H) 07/12/2016   GLUCAP 147 (H) 07/12/2016     Exercise Target Goals:    Exercise Program  Goal: Individual exercise prescription set with THRR, safety & activity barriers. Participant demonstrates ability to understand and report RPE using BORG scale, to self-measure pulse accurately, and to acknowledge the importance of the exercise prescription.  Exercise Prescription Goal: Starting with aerobic activity 30 plus minutes a day, 3 days per week for initial exercise prescription. Provide home exercise prescription and guidelines that participant acknowledges understanding prior to discharge.  Activity Barriers & Risk Stratification:     Activity Barriers & Cardiac Risk Stratification - 09/13/16 1358      Activity Barriers & Cardiac Risk Stratification   Activity Barriers None  Her anxiety medication if taken before coming to class will make her foggy and drowsy.   Cardiac Risk Stratification High      6 Minute Walk:     6 Minute Walk    Row Name 09/13/16 1437         6 Minute Walk   Phase Initial     Distance 1000 feet     Walk Time 6 minutes     # of Rest Breaks 0     MPH 1.89     METS 2.45     RPE 15     Perceived Dyspnea  13     VO2 Peak 13.86     Symptoms No     Resting HR 86 bpm     Resting BP 152/96     Max Ex. HR 107 bpm     Max Ex. BP 160/108     2 Minute Post BP 160/90        Initial Exercise Prescription:     Initial Exercise Prescription - 09/13/16 1400      Date of Initial Exercise RX and Referring Provider   Date 09/13/16   Referring Provider Dr. Fletcher Anon     Treadmill   MPH 1.3   Grade 0   Minutes 20   METs 1.9     NuStep   Level 2   Watts 15   Minutes 15   METs 1.9     Prescription Details   Frequency (times per week) 3   Duration Progress to 30 minutes of continuous aerobic without signs/symptoms of physical distress     Intensity   THRR REST +  30   THRR 40-80% of Max Heartrate 122-141-160   Ratings of Perceived Exertion 11-13   Perceived Dyspnea 0-4  Progression   Progression Continue progressive overload as per  policy without signs/symptoms or physical distress.     Resistance Training   Training Prescription Yes   Weight 1   Reps 10-12      Perform Capillary Blood Glucose checks as needed.  Exercise Prescription Changes:      Exercise Prescription Changes    Row Name 12/04/16 1100 12/31/16 1400           Exercise Review   Progression Yes Yes        Response to Exercise   Blood Pressure (Admit) 150/84 130/80      Blood Pressure (Exercise) 160/94 160/100      Blood Pressure (Exit) 120/80 110/68      Heart Rate (Admit) 68 bpm 73 bpm      Heart Rate (Exercise) 94 bpm 91 bpm      Heart Rate (Exit) 77 bpm 82 bpm      Rating of Perceived Exertion (Exercise) 11 10      Duration Progress to 30 minutes of continuous aerobic without signs/symptoms of physical distress Progress to 30 minutes of continuous aerobic without signs/symptoms of physical distress      Intensity Rest + 30 Rest + 30        Progression   Progression Continue progressive overload as per policy without signs/symptoms or physical distress. Continue progressive overload as per policy without signs/symptoms or physical distress.        Resistance Training   Training Prescription Yes Yes      Weight 1 3      Reps 10-12 10-12        Treadmill   MPH 2 2      Grade 0 0      Minutes 15 15      METs 2.5 2.5        NuStep   Level 2 3      Watts 24 42      Minutes 20 20      METs 3.64 3.66        Home Exercise Plan   Plans to continue exercise at [facility] 2 additional days to program exercise sessions. Add 2 additional days to program exercise sessions.         Exercise Comments:      Exercise Comments    Row Name 12/04/16 1136 12/31/16 1417         Exercise Comments The patient is being progressed appropriately.  Patient is progressing well.          Discharge Exercise Prescription (Final Exercise Prescription Changes):     Exercise Prescription Changes - 12/31/16 1400       Exercise Review   Progression Yes     Response to Exercise   Blood Pressure (Admit) 130/80   Blood Pressure (Exercise) 160/100   Blood Pressure (Exit) 110/68   Heart Rate (Admit) 73 bpm   Heart Rate (Exercise) 91 bpm   Heart Rate (Exit) 82 bpm   Rating of Perceived Exertion (Exercise) 10   Duration Progress to 30 minutes of continuous aerobic without signs/symptoms of physical distress   Intensity Rest + 30     Progression   Progression Continue progressive overload as per policy without signs/symptoms or physical distress.     Resistance Training   Training Prescription Yes   Weight 3   Reps 10-12     Treadmill   MPH 2  Grade 0   Minutes 15   METs 2.5     NuStep   Level 3   Watts 42   Minutes 20   METs 3.66     Home Exercise Plan   Plans to continue exercise at Home   Frequency Add 2 additional days to program exercise sessions.      Nutrition:  Target Goals: Understanding of nutrition guidelines, daily intake of sodium 1500mg , cholesterol 200mg , calories 30% from fat and 7% or less from saturated fats, daily to have 5 or more servings of fruits and vegetables.  Biometrics:     Pre Biometrics - 09/13/16 1439      Pre Biometrics   Height 5\' 7"  (1.702 m)   Weight 202 lb 6.1 oz (91.8 kg)   Waist Circumference 41 inches   Hip Circumference 44 inches   Waist to Hip Ratio 0.93 %   BMI (Calculated) 31.8   Triceps Skinfold 16 mm   % Body Fat 37.8 %   Grip Strength 64 kg   Flexibility 11.5 in   Single Leg Stand 10 seconds       Nutrition Therapy Plan and Nutrition Goals:   Nutrition Discharge: Rate Your Plate Scores:     Nutrition Assessments - 09/13/16 1402      MEDFICTS Scores   Pre Score 36      Nutrition Goals Re-Evaluation:   Psychosocial: Target Goals: Acknowledge presence or absence of depression, maximize coping skills, provide positive support system. Participant is able to verbalize types and ability to use techniques and skills  needed for reducing stress and depression.  Initial Review & Psychosocial Screening:     Initial Psych Review & Screening - 09/13/16 1439      Initial Review   Current issues with Current Depression;History of Depression;Current Anxiety/Panic;Current Stress Concerns  PTSD   Source of Stress Concerns Chronic Illness   Comments Patient is currently in counseling for her depression.      Family Dynamics   Good Support System? Yes     Barriers   Psychosocial barriers to participate in program The patient should benefit from training in stress management and relaxation.;Psychosocial barriers identified (see note)  Patient QOL score 12.63 which is low and show depression. Patient is oan medication for depression and currently in counseling.      Screening Interventions   Interventions Encouraged to exercise      Quality of Life Scores:     Quality of Life - 09/13/16 1443      Quality of Life Scores   Health/Function Pre 10.2 %   Socioeconomic Pre 10.06 %   Psych/Spiritual Pre 10.93 %   Family Pre 26.4 %   GLOBAL Pre 12.63 %      PHQ-9: Recent Review Flowsheet Data    Depression screen [facility] 2/9 09/13/2016   Decreased Interest 1   Down, Depressed, Hopeless 3   PHQ - 2 Score 4   Altered sleeping 3   Tired, decreased energy 3   Change in appetite 3   Feeling bad or failure about yourself  3   Trouble concentrating 3   Moving slowly or fidgety/restless 3   Suicidal thoughts 1   PHQ-9 Score 23   Difficult doing work/chores Extremely dIfficult      Psychosocial Evaluation and Intervention:     Psychosocial Evaluation - 09/13/16 1441      Psychosocial Evaluation & Interventions   Interventions Stress management education;Relaxation education;Encouraged to exercise with the program and follow  exercise prescription   Comments Already in counseling.    Continued Psychosocial Services Needed Yes      Psychosocial Re-Evaluation:     Psychosocial Re-Evaluation     [place] Name 12/05/16 1344 01/03/17 1116           Psychosocial Re-Evaluation   Interventions Encouraged to attend Cardiac Rehabilitation for the exercise;Stress management education Encouraged to attend Cardiac Rehabilitation for the exercise      Comments Patient's QOL was 12.63 and her PHQ-9 score was 23. She is currently seeing a counselor and is on anti-anxiety medication. Patient continues to see a counselor and is taking anti-anxiety medication.       Continued Psychosocial Services Needed Yes Yes         Vocational Rehabilitation: Provide vocational rehab assistance to qualifying candidates.   Vocational Rehab Evaluation & Intervention:     Vocational Rehab - 09/13/16 1400      Initial Vocational Rehab Evaluation & Intervention   Assessment shows need for Vocational Rehabilitation No      Education: Education Goals: Education classes will be provided on a weekly basis, covering required topics. Participant will state understanding/return demonstration of topics presented.  Learning Barriers/Preferences:     Learning Barriers/Preferences - 09/13/16 1359      Learning Barriers/Preferences   Learning Barriers None   Learning Preferences Verbal Instruction;Skilled Demonstration      Education Topics: Hypertension, Hypertension Reduction -Define heart disease and high blood pressure. Discus how high blood pressure affects the body and ways to reduce high blood pressure. Flowsheet Row CARDIAC REHAB PHASE II EXERCISE from 01/02/2017 in [place]  Date  (P) 10/24/16  Educator  (P) DC  Instruction Review Code  (P) 2- meets goals/outcomes      Exercise and Your Heart -Discuss why it is important to exercise, the FITT principles of exercise, normal and abnormal responses to exercise, and how to exercise safely. Flowsheet Row CARDIAC REHAB PHASE II EXERCISE from 01/02/2017 in [place]  Date  (P) 10/31/16  Educator  (P) Russella Dar  Instruction Review Code  (P) 2- meets goals/outcomes      Angina -Discuss definition of angina, causes of angina, treatment of angina, and how to decrease risk of having angina.   Cardiac Medications -Review what the following cardiac medications are used for, how they affect the body, and side effects that may occur when taking the medications.  Medications include Aspirin, Beta blockers, calcium channel blockers, ACE Inhibitors, angiotensin receptor blockers, diuretics, digoxin, and antihyperlipidemics.   Congestive Heart Failure -Discuss the definition of CHF, how to live with CHF, the signs and symptoms of CHF, and how keep track of weight and sodium intake. Flowsheet Row CARDIAC REHAB PHASE II EXERCISE from 01/02/2017 in [place]  Date  (P) 11/21/16  Educator  (P) Lisabeth Register  Instruction Review Code  (P) 2- meets goals/outcomes      Heart Disease and Intimacy -Discus the effect sexual activity has on the heart, how changes occur during intimacy as we age, and safety during sexual activity.   Smoking Cessation / COPD -Discuss different methods to quit smoking, the health benefits of quitting smoking, and the definition of COPD. Flowsheet Row CARDIAC REHAB PHASE II EXERCISE from 01/02/2017 in [place]  Date  (P) 12/05/16  Educator  (P) Russella Dar  Instruction Review Code  (P) 2- meets goals/outcomes      Nutrition I: Fats -Discuss the types of  cholesterol, what cholesterol does to the heart, and how cholesterol levels can be controlled. Flowsheet Row CARDIAC REHAB PHASE II EXERCISE from 01/02/2017 in [place]  Date  (P) 12/12/16  Educator  (P) Russella Dar  Instruction Review Code  (P) 2- meets goals/outcomes      Nutrition II: Labels -Discuss the different components of food labels and how to read food label [place] from 01/02/2017 in [place]  Date  (P) 09/19/16  Educator  (P) Russella Dar  Instruction Review Code  (P) 2- meets goals/outcomes      Heart Parts and Heart Disease -Discuss the anatomy of the heart, the pathway of blood circulation through the heart, and these are affected by heart disease. Flowsheet Row CARDIAC REHAB PHASE II EXERCISE from 01/02/2017 in [place]  Date  (P) 09/26/16  Educator  (P) DC  Instruction Review Code  (P) 2- meets goals/outcomes      Stress I: Signs and Symptoms -Discuss the causes of stress, how stress may lead to anxiety and depression, and ways to limit stress. Flowsheet Row CARDIAC REHAB PHASE II EXERCISE from 01/02/2017 in [place]  Date  (P) 10/03/16  Educator  (P) Russella Dar  Instruction Review Code  (P) 2- meets goals/outcomes      Stress II: Relaxation -Discuss different types of relaxation techniques to limit stress. Flowsheet Row CARDIAC REHAB PHASE II EXERCISE from 01/02/2017 in [place]  Date  (P) 10/10/16  Educator  (P) Russella Dar  Instruction Review Code  (P) 2- meets goals/outcomes      Warning Signs of Stroke / TIA -Discuss definition of a stroke, what the signs and symptoms are of a stroke, and how to identify when someone is having stroke. Flowsheet Row CARDIAC REHAB PHASE II EXERCISE from 01/02/2017 in [place]  Date  (P) 10/17/16  Educator  (P) DJ  Instruction Review Code  (P) 2- meets goals/outcomes      Knowledge Questionnaire Score:     Knowledge Questionnaire Score - 09/13/16 1359      Knowledge Questionnaire Score   Pre Score 23/28      Core Components/Risk Factors/Patient Goals at Admission:     Personal Goals and Risk Factors at Admission - 09/13/16 1428      Core Components/Risk Factors/Patient Goals on Admission    Weight Management Weight Maintenance   Increase Strength and Stamina Yes   Intervention Provide  advice, education, support and counseling about physical activity/exercise needs.;Develop an individualized exercise prescription for aerobic and resistive training based on initial evaluation findings, risk stratification, comorbidities and participant's personal goals.   Expected Outcomes Achievement of increased cardiorespiratory fitness and enhanced flexibility, muscular endurance and strength shown through measurements of functional capacity and personal statement of participant.   Tobacco Cessation Yes   Intervention Assist the participant in steps to quit. Provide individualized education and counseling about committing to Tobacco Cessation, relapse prevention, and pharmacological support that can be provided by physician.;Advice worker, assist with locating and accessing local/national Quit Smoking programs, and support quit date choice.   Expected Outcomes Long Term: Complete abstinence from all tobacco products for at least 12 months from quit date.;Short Term: Will demonstrate readiness to quit, by selecting a quit date.   Diabetes Yes   Intervention Provide education about signs/symptoms and action to take for hypo/hyperglycemia.   Expected Outcomes Long Term: Attainment of HbA1C <  7%.;Short Term: Participant verbalizes understanding of the signs/symptoms and immediate care of hyper/hypoglycemia, proper foot care and importance of medication, aerobic/resistive exercise and nutrition plan for blood glucose control.   Hypertension Yes   Intervention Monitor prescription use compliance.;Provide education on lifestyle modifcations including regular physical activity/exercise, weight management, moderate sodium restriction and increased consumption of fresh fruit, vegetables, and low fat dairy, alcohol moderation, and smoking cessation.   Expected Outcomes Short Term: Continued assessment and intervention until BP is < 140/73mm HG in hypertensive participants. < 130/52mm HG in  hypertensive participants with diabetes, heart failure or chronic kidney disease.;Long Term: Maintenance of blood pressure at goal levels.   Personal Goal Other Yes   Personal Goal Get back to regular intimacy with husband, get back to normal ADL's   Intervention Attend CR 3 x week and supplement with 2 x week at home.    Expected Outcomes To reach persoanl goals.       Core Components/Risk Factors/Patient Goals Review:      Goals and Risk Factor Review    Row Name 09/13/16 1438 12/05/16 1339 01/03/17 1112         Core Components/Risk Factors/Patient Goals Review   Personal Goals Review Tobacco Cessation;Increase Strength and Stamina;Hypertension Weight Management/Obesity;Increase Strength and Stamina;Tobacco Cessation;Hypertension  Get back to regular intimacy with husband, get back to normal ADL's Weight Management/Obesity;Increase Strength and Stamina;Hypertension;Diabetes  Get back to regular intimacy with husband, get back to normal ADL's     Review  - Patient has attended 23 sessions gaining 8 lbs since starting the program. She continue to smoke. She continues to have elevated B/P both intially and during exercise. We have ended her session early x's 2 d/t hypertension. Her PCP added Cozaar 50 mg qd 12/03/16. She has progressed some with some increased strength and stamina.  Patient has attended 29 sessions. She has gained 16.2 lbs since starting the program. Her hypertension continues to be uncontrolled in spite of her PCP adjusting her medication regimen. Patient has made progress in the program with increased strength and stamina.      Expected Outcomes  - Patient will continue to attend sessions meeting her personal goals.  Patient will complete the program meeting her personal goals.         Core Components/Risk Factors/Patient Goals at Discharge (Final Review):      Goals and Risk Factor Review - 01/03/17 1112      Core Components/Risk Factors/Patient Goals Review    Personal Goals Review Weight Management/Obesity;Increase Strength and Stamina;Hypertension;Diabetes  Get back to regular intimacy with husband, get back to normal ADL's   Review Patient has attended 29 sessions. She has gained 16.2 lbs since starting the program. Her hypertension continues to be uncontrolled in spite of her PCP adjusting her medication regimen. Patient has made progress in the program with increased strength and stamina.    Expected Outcomes Patient will complete the program meeting her personal goals.       ITP Comments:   Comments: ITP 30 Day REVIEW Patient doing well with the program. Will continue to monitor for progress.

## 2017-01-04 ENCOUNTER — Encounter (HOSPITAL_COMMUNITY)
Admission: RE | Admit: 2017-01-04 | Discharge: 2017-01-04 | Disposition: A | Discharge status: Home / Self Care | Payer: BLUE CROSS/BLUE SHIELD | Source: Ambulatory Visit | Attending: Cardiovascular Disease | Admitting: Cardiovascular Disease

## 2017-01-04 DIAGNOSIS — Z951 Presence of aortocoronary bypass graft: Secondary | ICD-10-CM

## 2017-01-04 DIAGNOSIS — I214 Non-ST elevation (NSTEMI) myocardial infarction: Secondary | ICD-10-CM

## 2017-01-04 NOTE — Progress Notes (Signed)
Daily Session Note  Patient Details  Name: Erika [name] MRN: [id number] Date of Birth: [birth year] Referring Provider:   Flowsheet Row CARDIAC REHAB PHASE II ORIENTATION from 09/13/2016 in [place]  Referring Provider  Dr. Fletcher Anon      Encounter Date: 01/04/2017  Check In:     Session Check In - 01/04/17 0925      Check-In   Location [facility]   Staff Present Suzanne Boron, BS, EP, Exercise Physiologist;Vanessa Alesi Wynetta Emery, RN, BSN   Supervising physician immediately available to respond to emergencies See telemetry face sheet for immediately available MD   Medication changes reported     No   Fall or balance concerns reported    No   Warm-up and Cool-down Performed as group-led instruction   Resistance Training Performed Yes   VAD Patient? No     Pain Assessment   Currently in Pain? No/denies   Pain Score 0-No pain   Multiple Pain Sites No      Capillary Blood Glucose: No results found for this or any previous visit (from the past 24 hour(s)).   Goals Met:  Independence with exercise equipment Exercise tolerated well No report of cardiac concerns or symptoms Strength training completed today  Goals Unmet:  Not Applicable  Comments: Check out 1030.   Dr. Kate Sable is Medical Director for [facility].

## 2017-01-07 ENCOUNTER — Encounter (HOSPITAL_COMMUNITY): Payer: BLUE CROSS/BLUE SHIELD

## 2017-01-09 ENCOUNTER — Encounter (HOSPITAL_COMMUNITY)
Admission: RE | Admit: 2017-01-09 | Payer: BLUE CROSS/BLUE SHIELD | Source: Ambulatory Visit | Attending: Cardiovascular Disease | Admitting: Cardiovascular Disease

## 2017-01-11 ENCOUNTER — Encounter (HOSPITAL_COMMUNITY)
Admission: RE | Admit: 2017-01-11 | Payer: BLUE CROSS/BLUE SHIELD | Source: Ambulatory Visit | Attending: Cardiovascular Disease | Admitting: Cardiovascular Disease

## 2017-01-14 ENCOUNTER — Encounter (HOSPITAL_COMMUNITY): Payer: BLUE CROSS/BLUE SHIELD

## 2017-01-15 ENCOUNTER — Ambulatory Visit (INDEPENDENT_AMBULATORY_CARE_PROVIDER_SITE_OTHER): Payer: BLUE CROSS/BLUE SHIELD | Admitting: Family Medicine

## 2017-01-15 VITALS — BP 144/78 | HR 77 | Temp 98.3°F | Wt 220.2 lb

## 2017-01-15 DIAGNOSIS — I119 Hypertensive heart disease without heart failure: Secondary | ICD-10-CM | POA: Diagnosis not present

## 2017-01-15 DIAGNOSIS — G44001 Cluster headache syndrome, unspecified, intractable: Secondary | ICD-10-CM

## 2017-01-15 MED ORDER — HYDROCODONE-ACETAMINOPHEN 7.5-325 MG PO TABS: 1.0000 | ORAL_TABLET | Freq: Four times a day (QID) | ORAL | 0 refills | Status: DC | PRN

## 2017-01-15 NOTE — Assessment & Plan Note (Signed)
Persistent. Discussed oxygen today. Next time she has a headache, she will call me to get worked in and we will give her O2 here. Rx for Norco refilled, printed and given to pt today.

## 2017-01-15 NOTE — Progress Notes (Signed)
Pre visit review using our clinic review tool, if applicable. No additional management support is needed unless otherwise documented below in the visit note. 

## 2017-01-15 NOTE — Progress Notes (Signed)
Subjective:   Patient ID: Erika [name], female    DOB: 07/25/1971, 46 y.o.   MRN: [id number]  Erika [name] is a pleasant 46 y.o. year old female who presents to clinic today with Headache  on 01/15/2017  HPI:  HTN- known h/o refractory HTN which has been much better since her CABG and right CEA in 06/2016.  Currently taking coreg, imdur and spironolactone.  Losartan 50 mg daily was added 12/04/16 after consulting with Dr. Fletcher Anon. She does feel this has helped.  Headaches are still occurring very frequently.  Takes norco but tries not to take it.  Feels it is not as effective.  Tramadol did not work.  BP Readings from Last 3 Encounters:  01/15/17 (!) 144/78  12/04/16 (!) 150/90  10/30/16 112/64    Current Outpatient Prescriptions on File Prior to Visit  Medication Sig Dispense Refill  . aspirin EC 325 MG EC tablet Take 1 tablet (325 mg total) by mouth daily. 30 tablet 0  . atorvastatin (LIPITOR) 80 MG tablet Take 1 tablet (80 mg total) by mouth daily at 6 PM. 90 tablet 2  . carvedilol (COREG) 25 MG tablet Take 1 tablet (25 mg total) by mouth 2 (two) times daily. 60 tablet 6  . clonazePAM (KLONOPIN) 0.5 MG tablet Take 1 tablet (0.5 mg total) by mouth 2 (two) times daily as needed for anxiety. 60 tablet 1  . FLUoxetine HCl 60 MG TABS Take 60 mg by mouth daily. 30 tablet 2  . isosorbide mononitrate (IMDUR) 30 MG 24 hr tablet Take 1 tablet (30 mg total) by mouth daily. 30 tablet 6  . lamoTRIgine (LAMICTAL) 200 MG tablet Take 1 tablet (200 mg total) by mouth daily. 30 tablet 2  . losartan (COZAAR) 50 MG tablet Take 1 tablet (50 mg total) by mouth daily. 90 tablet 3  . metFORMIN (GLUCOPHAGE) 500 MG tablet Take 1 tablet (500 mg total) by mouth daily with breakfast.    . nitroGLYCERIN (NITROSTAT) 0.4 MG SL tablet Place 1 tablet (0.4 mg total) under the tongue every 5 (five) minutes as needed for chest pain. 30 tablet 0  . spironolactone (ALDACTONE) 25 MG tablet Take 1 tablet (25  mg total) by mouth daily. (Patient taking differently: Take 25 mg by mouth at bedtime. ) 30 tablet 3   No current facility-administered medications on file prior to visit.     Allergies  Allergen Reactions  . No Known Allergies     Past Medical History:  Diagnosis Date  . Arthralgia of temporomandibular joint   . CAD, multiple vessel    a. cath 06/29/16: ostLM 40%, ostLAD 40%, pLAD 95%, ost-pLCx 60%, pLCx 95%, mLCx 60%, mRCA 95%, D2 50%, LVSF nl;  b. 07/2016 CABG x 4 (LIMA->LAD, VG->Diag, VG->OM, VG->RCA).  . Carotid arterial disease ([place])    a. 07/2016 s/p R CEA.  . Depression   . Diastolic dysfunction    a.e cho 06/28/16: EF 50-55%, mild inf wall HK, GR1DD, mild MR, RV sys fxn nl, mildly dilated LA, PASP nl  . Fatty liver disease, nonalcoholic Q000111Q  . HLD (hyperlipidemia)   . Malignant hypertension   . Obesity   . PTSD (post-traumatic stress disorder)   . Tobacco abuse   . Type 2 diabetes mellitus [facility]) January 2017    Past Surgical History:  Procedure Laterality Date  . CARDIAC CATHETERIZATION [name] 06/29/2016   Procedure: Left Heart Cath and Coronary Angiography;  Surgeon: Minna Merritts, MD;  Location: [facility]  INVASIVE CV LAB;  Service: Cardiovascular;  Laterality: [name];  . CARDIAC CATHETERIZATION [name] 08/29/2016   Procedure: Left Heart Cath and Cors/Grafts Angiography;  Surgeon: Wellington Hampshire, MD;  Location: [place] CV LAB;  Service: Cardiovascular;  Laterality: [name];  . CESAREAN SECTION    . CHOLECYSTECTOMY    . CORONARY ARTERY BYPASS GRAFT [name] 07/06/2016   Procedure: CORONARY ARTERY BYPASS GRAFTING (CABG) x four, using left internal mammary artery and right leg greater saphenous vein harvested endoscopically;  Surgeon: Ivin Poot, MD;  Location: [place];  Service: Open Heart Surgery;  Laterality: [name];  . ENDARTERECTOMY Right 07/06/2016   Procedure: ENDARTERECTOMY CAROTID;  Surgeon: Rosetta Posner, MD;  Location: [place];  Service: Vascular;  Laterality: Right;  . PERIPHERAL  VASCULAR CATHETERIZATION [name] 04/18/2016   Procedure: Renal Angiography;  Surgeon: Wellington Hampshire, MD;  Location: [place] CV LAB;  Service: Cardiovascular;  Laterality: [name];  . TEE WITHOUT CARDIOVERSION [name] 07/06/2016   Procedure: TRANSESOPHAGEAL ECHOCARDIOGRAM (TEE);  Surgeon: Ivin Poot, MD;  Location: [place];  Service: Open Heart Surgery;  Laterality: [name];  . TONSILLECTOMY      Family History  Problem Relation Age of Onset  . Adopted: Yes  . Diabetes Mother   . Diabetes Father   . Alcohol abuse Father   . Heart disease Father   . Drug abuse Father   . Stroke Sister   . Anxiety disorder Sister     Social History   Social History  . Marital status: Married    Spouse name: [name]  . Number of children: [name]  . Years of education: [name]   Occupational History  . Not on file.   Social History Main Topics  . Smoking status: Former Smoker    Packs/day: 0.50    Types: Cigarettes    Quit date: 06/28/2016  . Smokeless tobacco: Never Used  . Alcohol use No  . Drug use: No  . Sexual activity: Yes    Birth control/ protection: None   Other Topics Concern  . Not on file   Social History Narrative  . No narrative on file   The PMH, PSH, Social History, Family History, Medications, and allergies have been reviewed in [facility], and have been updated if relevant.   Review of Systems  Constitutional: Negative.   Eyes: Negative.   Respiratory: Negative.   Cardiovascular: Negative.   Musculoskeletal: Negative.   Neurological: Positive for headaches. Negative for dizziness, tremors, seizures, syncope, facial asymmetry, speech difficulty, weakness, light-headedness and numbness.  Hematological: Negative.   Psychiatric/Behavioral: Negative.   All other systems reviewed and are negative.      Objective:    BP (!) 144/78   Pulse 77   Temp 98.3 F (36.8 C) (Oral)   Wt 220 lb 4 oz (99.9 kg)   LMP 12/27/2016   SpO2 92%   BMI 34.50 kg/m    Physical Exam  Constitutional:  She is oriented to person, place, and time. She appears well-developed and well-nourished. No distress.  HENT:  Head: Normocephalic and atraumatic.  Eyes: Conjunctivae are normal.  Cardiovascular: Normal rate and regular rhythm.   Pulmonary/Chest: Effort normal.  Musculoskeletal: Normal range of motion.  Neurological: She is alert and oriented to person, place, and time. No cranial nerve deficit.  Skin: Skin is warm and dry. She is not diaphoretic.  Psychiatric: She has a normal mood and affect. Her behavior is normal. Judgment and thought content normal.  Nursing note and vitals reviewed.  Assessment & Plan:   Hypertension, accelerated with heart disease, without CHF  Intractable cluster headache syndrome, unspecified chronicity pattern No Follow-up on file.

## 2017-01-15 NOTE — Assessment & Plan Note (Signed)
Improved with losartan. No changes made today. Has follow up scheduled with Dr. Fletcher Anon.

## 2017-01-16 ENCOUNTER — Encounter (HOSPITAL_COMMUNITY)
Admission: RE | Admit: 2017-01-16 | Payer: BLUE CROSS/BLUE SHIELD | Source: Ambulatory Visit | Attending: Cardiovascular Disease | Admitting: Cardiovascular Disease

## 2017-01-17 ENCOUNTER — Encounter (HOSPITAL_COMMUNITY): Payer: Self-pay | Admitting: Psychiatry

## 2017-01-17 ENCOUNTER — Ambulatory Visit (INDEPENDENT_AMBULATORY_CARE_PROVIDER_SITE_OTHER): Payer: BLUE CROSS/BLUE SHIELD | Admitting: Psychiatry

## 2017-01-17 VITALS — BP 142/88 | HR 72 | Ht 67.0 in | Wt 219.6 lb

## 2017-01-17 DIAGNOSIS — F331 Major depressive disorder, recurrent, moderate: Secondary | ICD-10-CM | POA: Diagnosis not present

## 2017-01-17 DIAGNOSIS — Z7982 Long term (current) use of aspirin: Secondary | ICD-10-CM | POA: Diagnosis not present

## 2017-01-17 DIAGNOSIS — Z87891 Personal history of nicotine dependence: Secondary | ICD-10-CM | POA: Diagnosis not present

## 2017-01-17 DIAGNOSIS — Z79899 Other long term (current) drug therapy: Secondary | ICD-10-CM | POA: Diagnosis not present

## 2017-01-17 MED ORDER — LAMOTRIGINE 200 MG PO TABS: 200.0000 mg | ORAL_TABLET | Freq: Every day | ORAL | 2 refills | Status: DC

## 2017-01-17 MED ORDER — FLUOXETINE HCL 60 MG PO TABS: 60.0000 mg | ORAL_TABLET | Freq: Every day | ORAL | 2 refills | Status: DC

## 2017-01-17 MED ORDER — CLONAZEPAM 0.5 MG PO TABS: 0.5000 mg | ORAL_TABLET | Freq: Three times a day (TID) | ORAL | 2 refills | Status: DC | PRN
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
## 2017-01-18 ENCOUNTER — Encounter (HOSPITAL_COMMUNITY): Payer: BLUE CROSS/BLUE SHIELD

## 2017-01-21 ENCOUNTER — Encounter (HOSPITAL_COMMUNITY): Payer: BLUE CROSS/BLUE SHIELD

## 2017-01-23 ENCOUNTER — Encounter (HOSPITAL_COMMUNITY): Payer: BLUE CROSS/BLUE SHIELD

## 2017-01-23 DIAGNOSIS — M545 Low back pain: Secondary | ICD-10-CM | POA: Diagnosis not present

## 2017-01-23 DIAGNOSIS — M9903 Segmental and somatic dysfunction of lumbar region: Secondary | ICD-10-CM | POA: Diagnosis not present

## 2017-01-23 DIAGNOSIS — M9905 Segmental and somatic dysfunction of pelvic region: Secondary | ICD-10-CM | POA: Diagnosis not present

## 2017-01-23 DIAGNOSIS — M9902 Segmental and somatic dysfunction of thoracic region: Secondary | ICD-10-CM | POA: Diagnosis not present

## 2017-01-23 NOTE — Addendum Note (Signed)
Encounter addended by: Dwana Melena, RN on: 01/23/2017  2:18 PM<BR>    Actions taken: Visit diagnoses modified, Visit Navigator Flowsheet section accepted, Sign clinical note, Episode resolved

## 2017-01-23 NOTE — Progress Notes (Signed)
Cardiac Individual Treatment Plan  Patient Details  Name: Erika [name] MRN: [id number] Date of Birth: 03-11-1971 Referring Provider:   Flowsheet Row CARDIAC REHAB PHASE II ORIENTATION from 09/13/2016 in [place]  Referring Provider  Dr. Fletcher Anon      Initial Encounter Date:  Flowsheet Row CARDIAC REHAB PHASE II ORIENTATION from 09/13/2016 in [place]  Date  09/13/16  Referring Provider  Dr. Fletcher Anon      Visit Diagnosis: S/P CABG x 4  NSTEMI (non-ST elevated myocardial infarction) [facility])  Patient's Home Medications on Admission:  Current Outpatient Prescriptions:  .  aspirin EC 325 MG EC tablet, Take 1 tablet (325 mg total) by mouth daily., Disp: 30 tablet, Rfl: 0 .  atorvastatin (LIPITOR) 80 MG tablet, Take 1 tablet (80 mg total) by mouth daily at 6 PM., Disp: 90 tablet, Rfl: 2 .  carvedilol (COREG) 25 MG tablet, Take 1 tablet (25 mg total) by mouth 2 (two) times daily., Disp: 60 tablet, Rfl: 6 .  clonazePAM (KLONOPIN) 0.5 MG tablet, Take 1 tablet (0.5 mg total) by mouth 3 (three) times daily as needed for anxiety., Disp: 90 tablet, Rfl: 2 .  FLUoxetine HCl 60 MG TABS, Take 60 mg by mouth daily., Disp: 30 tablet, Rfl: 2 .  HYDROcodone-acetaminophen (NORCO) 7.5-325 MG tablet, Take 1 tablet by mouth every 6 (six) hours as needed for moderate pain., Disp: 60 tablet, Rfl: 0 .  isosorbide mononitrate (IMDUR) 30 MG 24 hr tablet, Take 1 tablet (30 mg total) by mouth daily., Disp: 30 tablet, Rfl: 6 .  lamoTRIgine (LAMICTAL) 200 MG tablet, Take 1 tablet (200 mg total) by mouth daily., Disp: 30 tablet, Rfl: 2 .  losartan (COZAAR) 50 MG tablet, Take 1 tablet (50 mg total) by mouth daily., Disp: 90 tablet, Rfl: 3 .  metFORMIN (GLUCOPHAGE) 500 MG tablet, Take 1 tablet (500 mg total) by mouth daily with breakfast., Disp: , Rfl:  .  nitroGLYCERIN (NITROSTAT) 0.4 MG SL tablet, Place 1 tablet (0.4 mg total) under the tongue every 5 (five) minutes as  needed for chest pain., Disp: 30 tablet, Rfl: 0 .  spironolactone (ALDACTONE) 25 MG tablet, Take 1 tablet (25 mg total) by mouth daily. (Patient taking differently: Take 25 mg by mouth at bedtime. ), Disp: 30 tablet, Rfl: 3  Past Medical History: Past Medical History:  Diagnosis Date  . Arthralgia of temporomandibular joint   . CAD, multiple vessel    a. cath 06/29/16: ostLM 40%, ostLAD 40%, pLAD 95%, ost-pLCx 60%, pLCx 95%, mLCx 60%, mRCA 95%, D2 50%, LVSF nl;  b. 07/2016 CABG x 4 (LIMA->LAD, VG->Diag, VG->OM, VG->RCA).  . Carotid arterial disease ([place])    a. 07/2016 s/p R CEA.  . Depression   . Diastolic dysfunction    a.e cho 06/28/16: EF 50-55%, mild inf wall HK, GR1DD, mild MR, RV sys fxn nl, mildly dilated LA, PASP nl  . Fatty liver disease, nonalcoholic [id number]  . HLD (hyperlipidemia)   . Malignant hypertension   . Obesity   . PTSD (post-traumatic stress disorder)   . Tobacco abuse   . Type 2 diabetes mellitus [facility]) January 2017    Tobacco Use: History  Smoking Status  . Former Smoker  . Packs/day: 0.50  . Types: Cigarettes  . Quit date: 06/28/2016  Smokeless Tobacco  . Never Used    Labs: Recent Review Flowsheet Data    Labs for ITP Cardiac and Pulmonary Rehab Latest Ref Rng & Units 07/06/2016 07/07/2016  07/07/2016 08/21/2016 08/30/2016   Cholestrol 100 - 199 mg/dL - - - 121 -   LDLCALC 0 - 99 mg/dL - - - 64 -   LDLDIRECT mg/dL - - - - -   HDL >39 mg/dL - - - 24(L) -   Trlycerides 0 - 149 mg/dL - - - 164(H) -   Hemoglobin A1c 4.8 - 5.6 % - - - - 6.2(H)   PHART 7.350 - 7.450 - 7.376 - - -   PCO2ART 35.0 - 45.0 mmHg - 43.9 - - -   HCO3 20.0 - 24.0 mEq/L - 25.6(H) - - -   TCO2 0 - 100 mmol/L 26 27 27  - -   ACIDBASEDEF 0.0 - 2.0 mmol/L - - - - -   O2SAT % - 92.0 - - -      Capillary Blood Glucose: Lab Results  Component Value Date   GLUCAP 120 (H) 08/29/2016   GLUCAP 123 (H) 07/13/2016   GLUCAP 148 (H) 07/12/2016   GLUCAP 127 (H) 07/12/2016   GLUCAP 147 (H) 07/12/2016      Exercise Target Goals:    Exercise Program Goal: Individual exercise prescription set with THRR, safety & activity barriers. Participant demonstrates ability to understand and report RPE using BORG scale, to self-measure pulse accurately, and to acknowledge the importance of the exercise prescription.  Exercise Prescription Goal: Starting with aerobic activity 30 plus minutes a day, 3 days per week for initial exercise prescription. Provide home exercise prescription and guidelines that participant acknowledges understanding prior to discharge.  Activity Barriers & Risk Stratification:     Activity Barriers & Cardiac Risk Stratification - 09/13/16 1358      Activity Barriers & Cardiac Risk Stratification   Activity Barriers None  Her anxiety medication if taken before coming to class will make her foggy and drowsy.   Cardiac Risk Stratification High      6 Minute Walk:     6 Minute Walk    Row Name 09/13/16 1437         6 Minute Walk   Phase Initial     Distance 1000 feet     Walk Time 6 minutes     # of Rest Breaks 0     MPH 1.89     METS 2.45     RPE 15     Perceived Dyspnea  13     VO2 Peak 13.86     Symptoms No     Resting HR 86 bpm     Resting BP 152/96     Max Ex. HR 107 bpm     Max Ex. BP 160/108     2 Minute Post BP 160/90        Initial Exercise Prescription:     Initial Exercise Prescription - 09/13/16 1400      Date of Initial Exercise RX and Referring Provider   Date 09/13/16   Referring Provider Dr. Fletcher Anon     Treadmill   MPH 1.3   Grade 0   Minutes 20   METs 1.9     NuStep   Level 2   Watts 15   Minutes 15   METs 1.9     Prescription Details   Frequency (times per week) 3   Duration Progress to 30 minutes of continuous aerobic without signs/symptoms of physical distress     Intensity   THRR REST +  30   THRR 40-80% of Max Heartrate 122-141-160   Ratings of  Perceived Exertion 11-13   Perceived Dyspnea 0-4      Progression   Progression Continue progressive overload as per policy without signs/symptoms or physical distress.     Resistance Training   Training Prescription Yes   Weight 1   Reps 10-12      Perform Capillary Blood Glucose checks as needed.  Exercise Prescription Changes:      Exercise Prescription Changes    Row Name 12/04/16 1100 12/31/16 1400           Exercise Review   Progression Yes Yes        Response to Exercise   Blood Pressure (Admit) 150/84 130/80      Blood Pressure (Exercise) 160/94 160/100      Blood Pressure (Exit) 120/80 110/68      Heart Rate (Admit) 68 bpm 73 bpm      Heart Rate (Exercise) 94 bpm 91 bpm      Heart Rate (Exit) 77 bpm 82 bpm      Rating of Perceived Exertion (Exercise) 11 10      Duration Progress to 30 minutes of continuous aerobic without signs/symptoms of physical distress Progress to 30 minutes of continuous aerobic without signs/symptoms of physical distress      Intensity Rest + 30 Rest + 30        Progression   Progression Continue progressive overload as per policy without signs/symptoms or physical distress. Continue progressive overload as per policy without signs/symptoms or physical distress.        Resistance Training   Training Prescription Yes Yes      Weight 1 3      Reps 10-12 10-12        Treadmill   MPH 2 2      Grade 0 0      Minutes 15 15      METs 2.5 2.5        NuStep   Level 2 3      Watts 24 42      Minutes 20 20      METs 3.64 3.66        Home Exercise Plan   Plans to continue exercise at [place] 2 additional days to program exercise sessions. Add 2 additional days to program exercise sessions.         Exercise Comments:      Exercise Comments    Row Name 12/04/16 1136 12/31/16 1417         Exercise Comments The patient is being progressed appropriately.  Patient is progressing well.          Discharge Exercise Prescription (Final Exercise Prescription  Changes):     Exercise Prescription Changes - 12/31/16 1400      Exercise Review   Progression Yes     Response to Exercise   Blood Pressure (Admit) 130/80   Blood Pressure (Exercise) 160/100   Blood Pressure (Exit) 110/68   Heart Rate (Admit) 73 bpm   Heart Rate (Exercise) 91 bpm   Heart Rate (Exit) 82 bpm   Rating of Perceived Exertion (Exercise) 10   Duration Progress to 30 minutes of continuous aerobic without signs/symptoms of physical distress   Intensity Rest + 30     Progression   Progression Continue progressive overload as per policy without signs/symptoms or physical distress.     Resistance Training   Training Prescription Yes   Weight 3   Reps  10-12     Treadmill   MPH 2   Grade 0   Minutes 15   METs 2.5     NuStep   Level 3   Watts 42   Minutes 20   METs 3.66     Home Exercise Plan   Plans to continue exercise at Home   Frequency Add 2 additional days to program exercise sessions.      Nutrition:  Target Goals: Understanding of nutrition guidelines, daily intake of sodium 1500mg , cholesterol 200mg , calories 30% from fat and 7% or less from saturated fats, daily to have 5 or more servings of fruits and vegetables.  Biometrics:     Pre Biometrics - 09/13/16 1439      Pre Biometrics   Height 5\' 7"  (1.702 m)   Weight 202 lb 6.1 oz (91.8 kg)   Waist Circumference 41 inches   Hip Circumference 44 inches   Waist to Hip Ratio 0.93 %   BMI (Calculated) 31.8   Triceps Skinfold 16 mm   % Body Fat 37.8 %   Grip Strength 64 kg   Flexibility 11.5 in   Single Leg Stand 10 seconds       Nutrition Therapy Plan and Nutrition Goals:   Nutrition Discharge: Rate Your Plate Scores:     Nutrition Assessments - 09/13/16 1402      MEDFICTS Scores   Pre Score 36      Nutrition Goals Re-Evaluation:   Psychosocial: Target Goals: Acknowledge presence or absence of depression, maximize coping skills, provide positive support system. Participant  is able to verbalize types and ability to use techniques and skills needed for reducing stress and depression.  Initial Review & Psychosocial Screening:     Initial Psych Review & Screening - 09/13/16 1439      Initial Review   Current issues with Current Depression;History of Depression;Current Anxiety/Panic;Current Stress Concerns  PTSD   Source of Stress Concerns Chronic Illness   Comments Patient is currently in counseling for her depression.      Family Dynamics   Good Support System? Yes     Barriers   Psychosocial barriers to participate in program The patient should benefit from training in stress management and relaxation.;Psychosocial barriers identified (see note)  Patient QOL score 12.63 which is low and show depression. Patient is oan medication for depression and currently in counseling.      Screening Interventions   Interventions Encouraged to exercise      Quality of Life Scores:     Quality of Life - 09/13/16 1443      Quality of Life Scores   Health/Function Pre 10.2 %   Socioeconomic Pre 10.06 %   Psych/Spiritual Pre 10.93 %   Family Pre 26.4 %   GLOBAL Pre 12.63 %      PHQ-9: Recent Review Flowsheet Data    Depression screen [facility] 2/9 09/13/2016   Decreased Interest 1   Down, Depressed, Hopeless 3   PHQ - 2 Score 4   Altered sleeping 3   Tired, decreased energy 3   Change in appetite 3   Feeling bad or failure about yourself  3   Trouble concentrating 3   Moving slowly or fidgety/restless 3   Suicidal thoughts 1   PHQ-9 Score 23   Difficult doing work/chores Extremely dIfficult      Psychosocial Evaluation and Intervention:     Psychosocial Evaluation - 09/13/16 1441      Psychosocial Evaluation & Interventions  Interventions Stress management education;Relaxation education;Encouraged to exercise with the program and follow exercise prescription   Comments Already in counseling.    Continued Psychosocial Services Needed Yes       Psychosocial Re-Evaluation:     Psychosocial Re-Evaluation    [place] Name 12/05/16 1344 01/03/17 1116           Psychosocial Re-Evaluation   Interventions Encouraged to attend Cardiac Rehabilitation for the exercise;Stress management education Encouraged to attend Cardiac Rehabilitation for the exercise      Comments Patient's QOL was 12.63 and her PHQ-9 score was 23. She is currently seeing a counselor and is on anti-anxiety medication. Patient continues to see a counselor and is taking anti-anxiety medication.       Continued Psychosocial Services Needed Yes Yes         Vocational Rehabilitation: Provide vocational rehab assistance to qualifying candidates.   Vocational Rehab Evaluation & Intervention:     Vocational Rehab - 09/13/16 1400      Initial Vocational Rehab Evaluation & Intervention   Assessment shows need for Vocational Rehabilitation No      Education: Education Goals: Education classes will be provided on a weekly basis, covering required topics. Participant will state understanding/return demonstration of topics presented.  Learning Barriers/Preferences:     Learning Barriers/Preferences - 09/13/16 1359      Learning Barriers/Preferences   Learning Barriers None   Learning Preferences Verbal Instruction;Skilled Demonstration      Education Topics: Hypertension, Hypertension Reduction -Define heart disease and high blood pressure. Discus how high blood pressure affects the body and ways to reduce high blood pressure. Flowsheet Row CARDIAC REHAB PHASE II EXERCISE from 01/02/2017 in [place]  Date  (P) 10/24/16  Educator  (P) DC  Instruction Review Code  (P) 2- meets goals/outcomes      Exercise and Your Heart -Discuss why it is important to exercise, the FITT principles of exercise, normal and abnormal responses to exercise, and how to exercise safely. Flowsheet Row CARDIAC REHAB PHASE II EXERCISE from 01/02/2017 in [name]  Date  (P) 10/31/16  Educator  (P) Russella Dar  Instruction Review Code  (P) 2- meets goals/outcomes      Angina -Discuss definition of angina, causes of angina, treatment of angina, and how to decrease risk of having angina.   Cardiac Medications -Review what the following cardiac medications are used for, how they affect the body, and side effects that may occur when taking the medications.  Medications include Aspirin, Beta blockers, calcium channel blockers, ACE Inhibitors, angiotensin receptor blockers, diuretics, digoxin, and antihyperlipidemics.   Congestive Heart Failure -Discuss the definition of CHF, how to live with CHF, the signs and symptoms of CHF, and how keep track of weight and sodium intake. Flowsheet Row CARDIAC REHAB PHASE II EXERCISE from 01/02/2017 in [place]  Date  (P) 11/21/16  Educator  (P) Lisabeth Register  Instruction Review Code  (P) 2- meets goals/outcomes      Heart Disease and Intimacy -Discus the effect sexual activity has on the heart, how changes occur during intimacy as we age, and safety during sexual activity.   Smoking Cessation / COPD -Discuss different methods to quit smoking, the health benefits of quitting smoking, and the definition of COPD. Flowsheet Row CARDIAC REHAB PHASE II EXERCISE from 01/02/2017 in [place]  Date  (P) 12/05/16  Educator  (P) Russella Dar  Instruction Review Code  (P) 2- meets goals/outcomes  Nutrition I: Fats -Discuss the types of cholesterol, what cholesterol does to the heart, and how cholesterol levels can be controlled. Flowsheet Row CARDIAC REHAB PHASE II EXERCISE from 01/02/2017 in [place]  Date  (P) 12/12/16  Educator  (P) Russella Dar  Instruction Review Code  (P) 2- meets goals/outcomes      Nutrition II: Labels -Discuss the different components of food labels and how to read food label [place] from 01/02/2017 in [place]  Date  (P) 09/19/16  Educator  (P) Russella Dar  Instruction Review Code  (P) 2- meets goals/outcomes      Heart Parts and Heart Disease -Discuss the anatomy of the heart, the pathway of blood circulation through the heart, and these are affected by heart disease. Flowsheet Row CARDIAC REHAB PHASE II EXERCISE from 01/02/2017 in [place]  Date  (P) 09/26/16  Educator  (P) DC  Instruction Review Code  (P) 2- meets goals/outcomes      Stress I: Signs and Symptoms -Discuss the causes of stress, how stress may lead to anxiety and depression, and ways to limit stress. Flowsheet Row CARDIAC REHAB PHASE II EXERCISE from 01/02/2017 in [place]  Date  (P) 10/03/16  Educator  (P) Russella Dar  Instruction Review Code  (P) 2- meets goals/outcomes      Stress II: Relaxation -Discuss different types of relaxation techniques to limit stress. Flowsheet Row CARDIAC REHAB PHASE II EXERCISE from 01/02/2017 in [place]  Date  (P) 10/10/16  Educator  (P) Russella Dar  Instruction Review Code  (P) 2- meets goals/outcomes      Warning Signs of Stroke / TIA -Discuss definition of a stroke, what the signs and symptoms are of a stroke, and how to identify when someone is having stroke. Flowsheet Row CARDIAC REHAB PHASE II EXERCISE from 01/02/2017 in [place]  Date  (P) 10/17/16  Educator  (P) DJ  Instruction Review Code  (P) 2- meets goals/outcomes      Knowledge Questionnaire Score:     Knowledge Questionnaire Score - 09/13/16 1359      Knowledge Questionnaire Score   Pre Score 23/28      Core Components/Risk Factors/Patient Goals at Admission:     Personal Goals and Risk Factors at Admission - 09/13/16 1428      Core Components/Risk Factors/Patient Goals on Admission    Weight Management Weight Maintenance    Increase Strength and Stamina Yes   Intervention Provide advice, education, support and counseling about physical activity/exercise needs.;Develop an individualized exercise prescription for aerobic and resistive training based on initial evaluation findings, risk stratification, comorbidities and participant's personal goals.   Expected Outcomes Achievement of increased cardiorespiratory fitness and enhanced flexibility, muscular endurance and strength shown through measurements of functional capacity and personal statement of participant.   Tobacco Cessation Yes   Intervention Assist the participant in steps to quit. Provide individualized education and counseling about committing to Tobacco Cessation, relapse prevention, and pharmacological support that can be provided by physician.;Advice worker, assist with locating and accessing local/national Quit Smoking programs, and support quit date choice.   Expected Outcomes Long Term: Complete abstinence from all tobacco products for at least 12 months from quit date.;Short Term: Will demonstrate readiness to quit, by selecting a quit date.   Diabetes Yes   Intervention Provide education about signs/symptoms and action to take for hypo/hyperglycemia.  Expected Outcomes Long Term: Attainment of HbA1C < 7%.;Short Term: Participant verbalizes understanding of the signs/symptoms and immediate care of hyper/hypoglycemia, proper foot care and importance of medication, aerobic/resistive exercise and nutrition plan for blood glucose control.   Hypertension Yes   Intervention Monitor prescription use compliance.;Provide education on lifestyle modifcations including regular physical activity/exercise, weight management, moderate sodium restriction and increased consumption of fresh fruit, vegetables, and low fat dairy, alcohol moderation, and smoking cessation.   Expected Outcomes Short Term: Continued assessment and intervention until BP is <  140/56mm HG in hypertensive participants. < 130/52mm HG in hypertensive participants with diabetes, heart failure or chronic kidney disease.;Long Term: Maintenance of blood pressure at goal levels.   Personal Goal Other Yes   Personal Goal Get back to regular intimacy with husband, get back to normal ADL's   Intervention Attend CR 3 x week and supplement with 2 x week at home.    Expected Outcomes To reach persoanl goals.       Core Components/Risk Factors/Patient Goals Review:      Goals and Risk Factor Review    Row Name 09/13/16 1438 12/05/16 1339 01/03/17 1112         Core Components/Risk Factors/Patient Goals Review   Personal Goals Review Tobacco Cessation;Increase Strength and Stamina;Hypertension Weight Management/Obesity;Increase Strength and Stamina;Tobacco Cessation;Hypertension  Get back to regular intimacy with husband, get back to normal ADL's Weight Management/Obesity;Increase Strength and Stamina;Hypertension;Diabetes  Get back to regular intimacy with husband, get back to normal ADL's     Review  - Patient has attended 23 sessions gaining 8 lbs since starting the program. She continue to smoke. She continues to have elevated B/P both intially and during exercise. We have ended her session early x's 2 d/t hypertension. Her PCP added Cozaar 50 mg qd 12/03/16. She has progressed some with some increased strength and stamina.  Patient has attended 29 sessions. She has gained 16.2 lbs since starting the program. Her hypertension continues to be uncontrolled in spite of her PCP adjusting her medication regimen. Patient has made progress in the program with increased strength and stamina.      Expected Outcomes  - Patient will continue to attend sessions meeting her personal goals.  Patient will complete the program meeting her personal goals.         Core Components/Risk Factors/Patient Goals at Discharge (Final Review):      Goals and Risk Factor Review - 01/03/17 1112       Core Components/Risk Factors/Patient Goals Review   Personal Goals Review Weight Management/Obesity;Increase Strength and Stamina;Hypertension;Diabetes  Get back to regular intimacy with husband, get back to normal ADL's   Review Patient has attended 29 sessions. She has gained 16.2 lbs since starting the program. Her hypertension continues to be uncontrolled in spite of her PCP adjusting her medication regimen. Patient has made progress in the program with increased strength and stamina.    Expected Outcomes Patient will complete the program meeting her personal goals.       ITP Comments:     ITP Comments    Row Name 01/23/17 1405           ITP Comments Patient stopped coming after 30 sessions stating she felt like she had benefited from the program but would not return d/t family issues. Her strength and stamina did increase some during the program but her b/p remained uncontrolled.           Comments: Patient stopped coming to Cardiac Rehab  on 01/04/2017 after attendeing 30 sessions. Called patient to remind them of the Cardiac Rehab policy that if they do not call or come for 3 consecutive visits that they would be discharged from the CR program. Doctor will be informed.

## 2017-01-23 NOTE — Progress Notes (Signed)
Discharge Summary  Patient Details  Name: Erika [name] MRN: [id number] Date of Birth: Dec 10, 1971 Referring Provider:   Flowsheet Row CARDIAC REHAB PHASE II ORIENTATION from 09/13/2016 in [employer]  Referring Provider  Dr. Fletcher Anon       Number of Visits: 30  Reason for Discharge:  Early Exit:  Patient stopped coming after 30 sessions stating she felt like she had benefited from the program but would not return d/t family issues.   Smoking History:  History  Smoking Status  . Former Smoker  . Packs/day: 0.50  . Types: Cigarettes  . Quit date: 06/28/2016  Smokeless Tobacco  . Never Used    Diagnosis:  S/P CABG x 4  NSTEMI (non-ST elevated myocardial infarction) ([place])  ADL [facility]:   Initial Exercise Prescription:     Initial Exercise Prescription - 09/13/16 1400      Date of Initial Exercise RX and Referring Provider   Date 09/13/16   Referring Provider Dr. Fletcher Anon     Treadmill   MPH 1.3   Grade 0   Minutes 20   METs 1.9     NuStep   Level 2   Watts 15   Minutes 15   METs 1.9     Prescription Details   Frequency (times per week) 3   Duration Progress to 30 minutes of continuous aerobic without signs/symptoms of physical distress     Intensity   THRR REST +  30   THRR 40-80% of Max Heartrate 122-141-160   Ratings of Perceived Exertion 11-13   Perceived Dyspnea 0-4     Progression   Progression Continue progressive overload as per policy without signs/symptoms or physical distress.     Resistance Training   Training Prescription Yes   Weight 1   Reps 10-12      Discharge Exercise Prescription (Final Exercise Prescription Changes):     Exercise Prescription Changes - 12/31/16 1400      Exercise Review   Progression Yes     Response to Exercise   Blood Pressure (Admit) 130/80   Blood Pressure (Exercise) 160/100   Blood Pressure (Exit) 110/68   Heart Rate (Admit) 73 bpm   Heart Rate (Exercise) 91 bpm   Heart Rate  (Exit) 82 bpm   Rating of Perceived Exertion (Exercise) 10   Duration Progress to 30 minutes of continuous aerobic without signs/symptoms of physical distress   Intensity Rest + 30     Progression   Progression Continue progressive overload as per policy without signs/symptoms or physical distress.     Resistance Training   Training Prescription Yes   Weight 3   Reps 10-12     Treadmill   MPH 2   Grade 0   Minutes 15   METs 2.5     NuStep   Level 3   Watts 42   Minutes 20   METs 3.66     Home Exercise Plan   Plans to continue exercise at Home   Frequency Add 2 additional days to program exercise sessions.      Functional Capacity:     6 Minute Walk    Row Name 09/13/16 1437         6 Minute Walk   Phase Initial     Distance 1000 feet     Walk Time 6 minutes     # of Rest Breaks 0     MPH 1.89     METS 2.45  RPE 15     Perceived Dyspnea  13     VO2 Peak 13.86     Symptoms No     Resting HR 86 bpm     Resting BP 152/96     Max Ex. HR 107 bpm     Max Ex. BP 160/108     2 Minute Post BP 160/90        Psychological, QOL, Others - Outcomes: PHQ 2/9: Depression screen PHQ 2/9 09/13/2016  Decreased Interest 1  Down, Depressed, Hopeless 3  PHQ - 2 Score 4  Altered sleeping 3  Tired, decreased energy 3  Change in appetite 3  Feeling bad or failure about yourself  3  Trouble concentrating 3  Moving slowly or fidgety/restless 3  Suicidal thoughts 1  PHQ-9 Score 23  Difficult doing work/chores Extremely dIfficult  Some encounter information is confidential and restricted. Go to Review Flowsheets activity to see all data.    Quality of Life:     Quality of Life - 09/13/16 1443      Quality of Life Scores   Health/Function Pre 10.2 %   Socioeconomic Pre 10.06 %   Psych/Spiritual Pre 10.93 %   Family Pre 26.4 %   GLOBAL Pre 12.63 %      Personal Goals: Goals established at orientation with interventions provided to work toward goal.      Personal Goals and Risk Factors at Admission - 09/13/16 1428      Core Components/Risk Factors/Patient Goals on Admission    Weight Management Weight Maintenance   Increase Strength and Stamina Yes   Intervention Provide advice, education, support and counseling about physical activity/exercise needs.;Develop an individualized exercise prescription for aerobic and resistive training based on initial evaluation findings, risk stratification, comorbidities and participant's personal goals.   Expected Outcomes Achievement of increased cardiorespiratory fitness and enhanced flexibility, muscular endurance and strength shown through measurements of functional capacity and personal statement of participant.   Tobacco Cessation Yes   Intervention Assist the participant in steps to quit. Provide individualized education and counseling about committing to Tobacco Cessation, relapse prevention, and pharmacological support that can be provided by physician.;Advice worker, assist with locating and accessing local/national Quit Smoking programs, and support quit date choice.   Expected Outcomes Long Term: Complete abstinence from all tobacco products for at least 12 months from quit date.;Short Term: Will demonstrate readiness to quit, by selecting a quit date.   Diabetes Yes   Intervention Provide education about signs/symptoms and action to take for hypo/hyperglycemia.   Expected Outcomes Long Term: Attainment of HbA1C < 7%.;Short Term: Participant verbalizes understanding of the signs/symptoms and immediate care of hyper/hypoglycemia, proper foot care and importance of medication, aerobic/resistive exercise and nutrition plan for blood glucose control.   Hypertension Yes   Intervention Monitor prescription use compliance.;Provide education on lifestyle modifcations including regular physical activity/exercise, weight management, moderate sodium restriction and increased consumption of fresh  fruit, vegetables, and low fat dairy, alcohol moderation, and smoking cessation.   Expected Outcomes Short Term: Continued assessment and intervention until BP is < 140/53mm HG in hypertensive participants. < 130/30mm HG in hypertensive participants with diabetes, heart failure or chronic kidney disease.;Long Term: Maintenance of blood pressure at goal levels.   Personal Goal Other Yes   Personal Goal Get back to regular intimacy with husband, get back to normal ADL's   Intervention Attend CR 3 x week and supplement with 2 x week at home.    Expected Outcomes  To reach persoanl goals.        Personal Goals Discharge:     Goals and Risk Factor Review    Row Name 09/13/16 1438 12/05/16 1339 01/03/17 1112         Core Components/Risk Factors/Patient Goals Review   Personal Goals Review Tobacco Cessation;Increase Strength and Stamina;Hypertension Weight Management/Obesity;Increase Strength and Stamina;Tobacco Cessation;Hypertension  Get back to regular intimacy with husband, get back to normal ADL's Weight Management/Obesity;Increase Strength and Stamina;Hypertension;Diabetes  Get back to regular intimacy with husband, get back to normal ADL's     Review  - Patient has attended 23 sessions gaining 8 lbs since starting the program. She continue to smoke. She continues to have elevated B/P both intially and during exercise. We have ended her session early x's 2 d/t hypertension. Her PCP added Cozaar 50 mg qd 12/03/16. She has progressed some with some increased strength and stamina.  Patient has attended 29 sessions. She has gained 16.2 lbs since starting the program. Her hypertension continues to be uncontrolled in spite of her PCP adjusting her medication regimen. Patient has made progress in the program with increased strength and stamina.      Expected Outcomes  - Patient will continue to attend sessions meeting her personal goals.  Patient will complete the program meeting her personal goals.          Nutrition & Weight - Outcomes:     Pre Biometrics - 09/13/16 1439      Pre Biometrics   Height 5\' 7"  (1.702 m)   Weight 202 lb 6.1 oz (91.8 kg)   Waist Circumference 41 inches   Hip Circumference 44 inches   Waist to Hip Ratio 0.93 %   BMI (Calculated) 31.8   Triceps Skinfold 16 mm   % Body Fat 37.8 %   Grip Strength 64 kg   Flexibility 11.5 in   Single Leg Stand 10 seconds       Nutrition:   Nutrition Discharge:     Nutrition Assessments - 09/13/16 1402      MEDFICTS Scores   Pre Score 36      Education Questionnaire Score:     Knowledge Questionnaire Score - 09/13/16 1359      Knowledge Questionnaire Score   Pre Score 23/28

## 2017-01-24 ENCOUNTER — Encounter: Payer: Self-pay | Admitting: Cardiovascular Disease

## 2017-01-24 ENCOUNTER — Ambulatory Visit (INDEPENDENT_AMBULATORY_CARE_PROVIDER_SITE_OTHER): Payer: BLUE CROSS/BLUE SHIELD | Admitting: Cardiovascular Disease

## 2017-01-24 ENCOUNTER — Other Ambulatory Visit: Payer: Self-pay | Admitting: Family Medicine

## 2017-01-24 VITALS — BP 180/98 | HR 64 | Ht 67.0 in | Wt 216.8 lb

## 2017-01-24 DIAGNOSIS — Z79899 Other long term (current) drug therapy: Secondary | ICD-10-CM | POA: Diagnosis not present

## 2017-01-24 DIAGNOSIS — Z720 Tobacco use: Secondary | ICD-10-CM

## 2017-01-24 DIAGNOSIS — I739 Peripheral vascular disease, unspecified: Secondary | ICD-10-CM

## 2017-01-24 DIAGNOSIS — E782 Mixed hyperlipidemia: Secondary | ICD-10-CM

## 2017-01-24 DIAGNOSIS — I2510 Atherosclerotic heart disease of native coronary artery without angina pectoris: Secondary | ICD-10-CM | POA: Diagnosis not present

## 2017-01-24 DIAGNOSIS — I779 Disorder of arteries and arterioles, unspecified: Secondary | ICD-10-CM | POA: Diagnosis not present

## 2017-01-24 DIAGNOSIS — I10 Essential (primary) hypertension: Secondary | ICD-10-CM | POA: Diagnosis not present

## 2017-01-24 MED ORDER — AZILSARTAN-CHLORTHALIDONE 40-25 MG PO TABS: 1.0000 | ORAL_TABLET | Freq: Every day | ORAL | 3 refills | Status: DC

## 2017-01-24 NOTE — Patient Instructions (Addendum)
Medication Instructions:  Your physician has recommended you make the following change in your medication:  1- STOP taking Losartan. 2- START Edarbyclor (azilsartan/chlorthalidone) 40/25 mg (1 tablet) by mouth once a day.   Labwork: Your physician recommends that you return for lab work in: [place].   Testing/Procedures: none  Follow-Up: Your physician recommends that you schedule a follow-up appointment in: [place].   If you need a refill on your cardiac medications before your next appointment, please call your pharmacy.  Samples of this drug EDARBYCLOR 40/25MG  were given to the patient, quantity 2 BOTTLES (14 TABLETS), Lot Number [id number], EXP 01/2019.

## 2017-01-24 NOTE — Progress Notes (Signed)
Cardiology Office Note   Date:  01/24/2017   ID:  Erika [name], DOB 06/20/1971, MRN [id number]  PCP:  Arnette Norris, MD Cardiologist:   [name], MD   Chief Complaint  Patient presents with  . other    35mo f/u. Pt  Reviewed meds with pt verbally.      History of Present Illness: Erika [name] is a 46 y.o. female who presents for a follow-up visit regarding Coronary artery disease and carotid disease.  [name] with no evidence of secondary hypertension. She presented in July with non-ST elevation myocardial infarction. She underwent cardiac catheterization which showed severe three-vessel coronary artery disease.  Preop carotid Doppler showed 80-99% right ICA stenosis. She underwent CABG and right carotid endarterectomy at the same time in July of 2017.  She was rehospitalized in September with chest pain in the setting of uncontrolled hypertension. Cardiac catheterization showed patent grafts . The LAD had diffuse disease distal to the anastomosis and was very small in caliber. Ejection fraction was normal with mildly elevated left ventricular end-diastolic pressure. Blood pressure medications were adjusted with improvement.  She was started on losartan in December. She continues to smoke  Half a pack per day. She finished cardiac rehabilitation and had no chest pain or shortness of breath. She is overall doing well other than uncontrolled hypertension. She reports that her blood pressure is frequently about [id number] systolic. She does not have any symptoms related to that.   Past Medical History:  Diagnosis Date  . Arthralgia of temporomandibular joint   . CAD, multiple vessel    a. cath 06/29/16: ostLM 40%, ostLAD 40%, pLAD 95%, ost-pLCx 60%, pLCx 95%, mLCx 60%, mRCA 95%, D2 50%, LVSF nl;  b. 07/2016 CABG x 4 (LIMA->LAD, VG->Diag, VG->OM, VG->RCA).  . Carotid arterial disease ([place])    a. 07/2016 s/p R CEA.  . Depression   .  Diastolic dysfunction    a.e cho 06/28/16: EF 50-55%, mild inf wall HK, GR1DD, mild MR, RV sys fxn nl, mildly dilated LA, PASP nl  . Fatty liver disease, nonalcoholic [id number]  . HLD (hyperlipidemia)   . Malignant hypertension   . Obesity   . PTSD (post-traumatic stress disorder)   . Tobacco abuse   . Type 2 diabetes mellitus [facility]) January 2017    Past Surgical History:  Procedure Laterality Date  . CARDIAC CATHETERIZATION [name] 06/29/2016   Procedure: Left Heart Cath and Coronary Angiography;  Surgeon: Minna Merritts, MD;  Location: [place] [facility];  Service: Cardiovascular;  Laterality: [name];  . CARDIAC CATHETERIZATION [name] 08/29/2016   Procedure: Left Heart Cath and Cors/Grafts Angiography;  Surgeon: Wellington Hampshire, MD;  Location: [place] [facility];  Service: Cardiovascular;  Laterality: [name];  . CESAREAN SECTION    . CHOLECYSTECTOMY    . CORONARY ARTERY BYPASS GRAFT [name] 07/06/2016   Procedure: CORONARY ARTERY BYPASS GRAFTING (CABG) x four, using left internal mammary artery and right leg greater saphenous vein harvested endoscopically;  Surgeon: Ivin Poot, MD;  Location: [place];  Service: Open Heart Surgery;  Laterality: [name];  . ENDARTERECTOMY Right 07/06/2016   Procedure: ENDARTERECTOMY CAROTID;  Surgeon: Rosetta Posner, MD;  Location: [place];  Service: Vascular;  Laterality: Right;  . PERIPHERAL VASCULAR CATHETERIZATION [name] 04/18/2016   Procedure: Renal Angiography;  Surgeon: Wellington Hampshire, MD;  Location: [place] [facility];  Service: Cardiovascular;  Laterality: [name];  . TEE WITHOUT CARDIOVERSION [name] 07/06/2016  Procedure: TRANSESOPHAGEAL ECHOCARDIOGRAM (TEE);  Surgeon: Ivin Poot, MD;  Location: [place];  Service: Open Heart Surgery;  Laterality: [name];  . TONSILLECTOMY       Current Outpatient Prescriptions  Medication Sig Dispense Refill  . aspirin EC 325 MG EC tablet Take 1 tablet (325 mg total) by mouth daily. 30 tablet 0  . atorvastatin (LIPITOR) 80 MG tablet Take 1 tablet  (80 mg total) by mouth daily at 6 PM. 90 tablet 2  . carvedilol (COREG) 25 MG tablet Take 1 tablet (25 mg total) by mouth 2 (two) times daily. 60 tablet 6  . clonazePAM (KLONOPIN) 0.5 MG tablet Take 1 tablet (0.5 mg total) by mouth 3 (three) times daily as needed for anxiety. 90 tablet 2  . FLUoxetine HCl 60 MG TABS Take 60 mg by mouth daily. 30 tablet 2  . HYDROcodone-acetaminophen (NORCO) 7.5-325 MG tablet Take 1 tablet by mouth every 6 (six) hours as needed for moderate pain. 60 tablet 0  . isosorbide mononitrate (IMDUR) 30 MG 24 hr tablet Take 1 tablet (30 mg total) by mouth daily. 30 tablet 6  . lamoTRIgine (LAMICTAL) 200 MG tablet Take 1 tablet (200 mg total) by mouth daily. 30 tablet 2  . losartan (COZAAR) 50 MG tablet Take 1 tablet (50 mg total) by mouth daily. 90 tablet 3  . metFORMIN (GLUCOPHAGE) 500 MG tablet Take 1 tablet (500 mg total) by mouth daily with breakfast.    . nitroGLYCERIN (NITROSTAT) 0.4 MG SL tablet Place 1 tablet (0.4 mg total) under the tongue every 5 (five) minutes as needed for chest pain. 30 tablet 0  . spironolactone (ALDACTONE) 25 MG tablet Take 1 tablet (25 mg total) by mouth daily. (Patient taking differently: Take 25 mg by mouth at bedtime. ) 30 tablet 3   No current facility-administered medications for this visit.     Allergies:   No known allergies    Social History:  The patient  reports that she has been smoking Cigarettes.  She has been smoking about 0.50 packs per day. She has never used smokeless tobacco. She reports that she does not drink alcohol or use drugs.   Family History:  The patient's family history includes Alcohol abuse in her father; Anxiety disorder in her sister; Diabetes in her father and mother; Drug abuse in her father; Heart disease in her father; Stroke in her sister. She was adopted.    ROS:  Please see the history of present illness.   Otherwise, review of systems are positive for none.   All other systems are reviewed and  negative.    PHYSICAL EXAM: VS:  BP (!) 180/98 (BP Location: Left Arm, Patient Position: Sitting, Cuff Size: Normal)   Pulse 64   Ht 5\' 7"  (1.702 m)   Wt 216 lb 12 oz (98.3 kg)   LMP 12/27/2016   BMI 33.95 kg/m  , BMI Body mass index is 33.95 kg/m. GEN: Well nourished, well developed, in no acute distress  HEENT: normal  Neck: no JVD, carotid bruits, or masses Cardiac: RRR; no  rubs, or gallops,no edema . There is 1/6 systolic flow murmur in the pulmonic area Respiratory:  clear to auscultation bilaterally, normal work of breathing GI: soft, nontender, nondistended, + BS MS: no deformity or atrophy  Skin: warm and dry, no rash Neuro:  Strength and sensation are intact Psych: euthymic mood, full affect Right groin is intact with no hematoma   EKG:  EKG is  ordered today. EKG  showed normal sinus rhythm with lateral T-wave changes suggestive of ischemia.   Recent Labs: 06/30/2016: B Natriuretic Peptide 120.7; TSH 1.485 07/07/2016: Magnesium 2.2 09/03/2016: ALT 38; Hemoglobin 12.4; Platelets 517.0 12/11/2016: BUN 8; Creatinine, Ser 0.68; Potassium 4.1; Sodium 136    Lipid Panel    Component Value Date/Time   CHOL 121 08/21/2016 0817   TRIG 164 (H) 08/21/2016 0817   HDL 24 (L) 08/21/2016 0817   CHOLHDL 5.0 (H) 08/21/2016 0817   CHOLHDL 9.5 06/28/2016 0544   VLDL UNABLE TO CALCULATE IF TRIGLYCERIDE OVER 400 mg/dL 06/28/2016 0544   LDLCALC 64 08/21/2016 0817   LDLDIRECT 177.1 04/22/2012 0809      Wt Readings from Last 3 Encounters:  01/24/17 216 lb 12 oz (98.3 kg)  01/15/17 220 lb 4 oz (99.9 kg)  12/04/16 216 lb 4 oz (98.1 kg)         ASSESSMENT AND PLAN:  1.  Coronary artery disease involving native coronary arteries without angina: Status post CABG in July. Continue medical therapy.   2. Carotid artery disease status post right carotid endarterectomy. Continue aggressive treatment of risk factors.  3. Essential hypertension: Blood pressure continues to be  extremely high in spite of 4 different blood pressure medications. Workup for secondary hypertension in the past was negative. I elected to switch losartan to Edarbyclor 40/25 mg once daily. Check basic metabolic profile in one week.  4. Hyperlipidemia: Continue high dose atorvastatin.  Most recent lipid profile in August showed significant improvement with an LDL of 64 and a triglyceride of 164.  5. Tobacco use: I again discussed with her the importance of complete smoking cessation.   Disposition:   FU with me in 2 months  Signed,  [name], MD  01/24/2017 10:59 AM    [place]

## 2017-01-25 ENCOUNTER — Encounter (HOSPITAL_COMMUNITY): Payer: BLUE CROSS/BLUE SHIELD

## 2017-02-04 ENCOUNTER — Encounter: Payer: Self-pay | Admitting: Family Medicine

## 2017-02-04 ENCOUNTER — Ambulatory Visit (INDEPENDENT_AMBULATORY_CARE_PROVIDER_SITE_OTHER): Payer: BLUE CROSS/BLUE SHIELD | Admitting: Family Medicine

## 2017-02-04 ENCOUNTER — Other Ambulatory Visit (INDEPENDENT_AMBULATORY_CARE_PROVIDER_SITE_OTHER): Payer: BLUE CROSS/BLUE SHIELD

## 2017-02-04 ENCOUNTER — Encounter: Payer: Self-pay | Admitting: Vascular Surgery

## 2017-02-04 VITALS — BP 128/72 | HR 92 | Temp 97.4°F | Wt 219.0 lb

## 2017-02-04 DIAGNOSIS — Z79899 Other long term (current) drug therapy: Secondary | ICD-10-CM

## 2017-02-04 DIAGNOSIS — I10 Essential (primary) hypertension: Secondary | ICD-10-CM

## 2017-02-04 DIAGNOSIS — G44001 Cluster headache syndrome, unspecified, intractable: Secondary | ICD-10-CM | POA: Diagnosis not present

## 2017-02-04 NOTE — Addendum Note (Signed)
Addended by: Lucille Passy on: 02/04/2017 10:45 AM   Modules accepted: Orders

## 2017-02-04 NOTE — Progress Notes (Signed)
Subjective:   Patient ID: Erika [name], female    DOB: 1971-01-11, 46 y.o.   MRN: [id number]  Erika [name] is a pleasant 46 y.o. year old female who presents to clinic today with Headache  on 02/04/2017  HPI: HTN- known h/o refractory HTN which has been much better since her CABG and right CEA in 06/2016.  Currently taking coreg, imdur and spironolactone.  Losartan 50 mg daily was added 12/04/16 after consulting with Dr. Fletcher Anon.  She also had follow up with Dr. Fletcher Anon on 01/24/17.  Note reviewed. Losartan was switched to Edarbyclor 40/25 mg daily.  Headaches are still occurring very frequently.  Takes norco but tries not to take it.  Feels it is not as effective.  Tramadol did not work.  I advised her to come in when she was having a bad headache so we could see if supplemental O2 here in the office may help.  She walks in today with a headache that is a 10/10.  No blurred vision, nausea or vomiting.  Current Outpatient Prescriptions on File Prior to Visit  Medication Sig Dispense Refill  . aspirin EC 325 MG EC tablet Take 1 tablet (325 mg total) by mouth daily. 30 tablet 0  . atorvastatin (LIPITOR) 80 MG tablet Take 1 tablet (80 mg total) by mouth daily at 6 PM. 90 tablet 2  . Azilsartan-Chlorthalidone (EDARBYCLOR) 40-25 MG TABS Take 1 tablet by mouth daily. 90 tablet 3  . carvedilol (COREG) 25 MG tablet Take 1 tablet (25 mg total) by mouth 2 (two) times daily. 60 tablet 6  . clonazePAM (KLONOPIN) 0.5 MG tablet Take 1 tablet (0.5 mg total) by mouth 3 (three) times daily as needed for anxiety. 90 tablet 2  . FLUoxetine HCl 60 MG TABS Take 60 mg by mouth daily. 30 tablet 2  . HYDROcodone-acetaminophen (NORCO) 7.5-325 MG tablet Take 1 tablet by mouth every 6 (six) hours as needed for moderate pain. 60 tablet 0  . isosorbide mononitrate (IMDUR) 30 MG 24 hr tablet Take 1 tablet (30 mg total) by mouth daily. 30 tablet 6  . lamoTRIgine (LAMICTAL) 200 MG tablet Take 1 tablet (200  mg total) by mouth daily. 30 tablet 2  . metFORMIN (GLUCOPHAGE) 500 MG tablet TAKE ONE TABLET BY MOUTH ONCE DAILY WITH BREAKFAST 30 tablet 3  . nitroGLYCERIN (NITROSTAT) 0.4 MG SL tablet Place 1 tablet (0.4 mg total) under the tongue every 5 (five) minutes as needed for chest pain. 30 tablet 0  . spironolactone (ALDACTONE) 25 MG tablet Take 1 tablet (25 mg total) by mouth daily. (Patient taking differently: Take 25 mg by mouth at bedtime. ) 30 tablet 3   No current facility-administered medications on file prior to visit.     Allergies  Allergen Reactions  . No Known Allergies     Past Medical History:  Diagnosis Date  . Arthralgia of temporomandibular joint   . CAD, multiple vessel    a. cath 06/29/16: ostLM 40%, ostLAD 40%, pLAD 95%, ost-pLCx 60%, pLCx 95%, mLCx 60%, mRCA 95%, D2 50%, LVSF nl;  b. 07/2016 CABG x 4 (LIMA->LAD, VG->Diag, VG->OM, VG->RCA).  . Carotid arterial disease ([place])    a. 07/2016 s/p R CEA.  . Depression   . Diastolic dysfunction    a.e cho 06/28/16: EF 50-55%, mild inf wall HK, GR1DD, mild MR, RV sys fxn nl, mildly dilated LA, PASP nl  . Fatty liver disease, nonalcoholic Q000111Q  . HLD (hyperlipidemia)   . Malignant  hypertension   . Obesity   . PTSD (post-traumatic stress disorder)   . Tobacco abuse   . Type 2 diabetes mellitus [facility]) January 2017    Past Surgical History:  Procedure Laterality Date  . CARDIAC CATHETERIZATION [name] 06/29/2016   Procedure: Left Heart Cath and Coronary Angiography;  Surgeon: Minna Merritts, MD;  Location: [place] [facility];  Service: Cardiovascular;  Laterality: [name];  . CARDIAC CATHETERIZATION [name] 08/29/2016   Procedure: Left Heart Cath and Cors/Grafts Angiography;  Surgeon: Wellington Hampshire, MD;  Location: [place] [facility];  Service: Cardiovascular;  Laterality: [name];  . CESAREAN SECTION    . CHOLECYSTECTOMY    . CORONARY ARTERY BYPASS GRAFT [name] 07/06/2016   Procedure: CORONARY ARTERY BYPASS GRAFTING (CABG) x four, using left  internal mammary artery and right leg greater saphenous vein harvested endoscopically;  Surgeon: Ivin Poot, MD;  Location: [place];  Service: Open Heart Surgery;  Laterality: [name];  . ENDARTERECTOMY Right 07/06/2016   Procedure: ENDARTERECTOMY CAROTID;  Surgeon: Rosetta Posner, MD;  Location: [place];  Service: Vascular;  Laterality: Right;  . PERIPHERAL VASCULAR CATHETERIZATION [name] 04/18/2016   Procedure: Renal Angiography;  Surgeon: Wellington Hampshire, MD;  Location: [place] [facility];  Service: Cardiovascular;  Laterality: [name];  . TEE WITHOUT CARDIOVERSION [name] 07/06/2016   Procedure: TRANSESOPHAGEAL ECHOCARDIOGRAM (TEE);  Surgeon: Ivin Poot, MD;  Location: [place];  Service: Open Heart Surgery;  Laterality: [name];  . TONSILLECTOMY      Family History  Problem Relation Age of Onset  . Adopted: Yes  . Diabetes Mother   . Diabetes Father   . Alcohol abuse Father   . Heart disease Father   . Drug abuse Father   . Stroke Sister   . Anxiety disorder Sister     Social History   Social History  . Marital status: Married    Spouse name: [name]  . Number of children: [name]  . Years of education: [name]   Occupational History  . Not on file.   Social History Main Topics  . Smoking status: Current Every Day Smoker    Packs/day: 0.50    Types: Cigarettes    Last attempt to quit: 06/28/2016  . Smokeless tobacco: Never Used  . Alcohol use No  . Drug use: No  . Sexual activity: Yes    Birth control/ protection: None   Other Topics Concern  . Not on file   Social History Narrative  . No narrative on file   The PMH, PSH, Social History, Family History, Medications, and allergies have been reviewed in [facility], and have been updated if relevant.   Review of Systems  Constitutional: Negative.   Neurological: Positive for headaches. Negative for tremors, seizures, syncope, speech difficulty, light-headedness and numbness.  All other systems reviewed and are negative.      Objective:    BP  128/72   Pulse 92   Temp 97.4 F (36.3 C) (Oral)   Wt 219 lb (99.3 kg)   SpO2 96%   BMI 34.30 kg/m    Physical Exam  Constitutional: She is oriented to person, place, and time. She appears well-developed and well-nourished. No distress.  HENT:  Head: Normocephalic and atraumatic.  Eyes: Conjunctivae are normal.  Cardiovascular: Normal rate.   Pulmonary/Chest: Effort normal.  Musculoskeletal: Normal range of motion.  Neurological: She is alert and oriented to person, place, and time. No cranial nerve deficit.  Skin: Skin is warm and dry. She  is not diaphoretic.  Psychiatric: She has a normal mood and affect. Her behavior is normal. Judgment and thought content normal.  Nursing note and vitals reviewed.         Assessment & Plan:   Intractable cluster headache syndrome, unspecified chronicity pattern  Malignant hypertension No Follow-up on file.

## 2017-02-04 NOTE — Progress Notes (Signed)
Pre visit review using our clinic review tool, if applicable. No additional management support is needed unless otherwise documented below in the visit note. 

## 2017-02-04 NOTE — Assessment & Plan Note (Signed)
Improved with less than 10 min of supplement O2 per [place]. Will call advance home care to see how to place this order for her to have at home. The patient indicates understanding of these issues and agrees with the plan.

## 2017-02-05 ENCOUNTER — Ambulatory Visit: Payer: Self-pay | Admitting: Family Medicine

## 2017-02-05 DIAGNOSIS — G44009 Cluster headache syndrome, unspecified, not intractable: Secondary | ICD-10-CM | POA: Diagnosis not present

## 2017-02-05 LAB — BASIC METABOLIC PANEL
BUN/Creatinine Ratio: 15 (ref 9–23)
BUN: 11 mg/dL (ref 6–24)
CALCIUM: 10.2 mg/dL (ref 8.7–10.2)
CHLORIDE: 92 mmol/L — AB (ref 96–106)
CO2: 24 mmol/L (ref 18–29)
CREATININE: 0.73 mg/dL (ref 0.57–1.00)
GFR calc non Af Amer: 101 mL/min/{1.73_m2} (ref >59)
GFR, EST AFRICAN AMERICAN: 117 mL/min/{1.73_m2} (ref >59)
GLUCOSE: 291 mg/dL — AB (ref 65–99)
Potassium: 3.5 mmol/L (ref 3.5–5.2)
Sodium: 136 mmol/L (ref 134–144)

## 2017-02-12 ENCOUNTER — Encounter: Payer: Self-pay | Admitting: Vascular Surgery

## 2017-02-12 ENCOUNTER — Ambulatory Visit (INDEPENDENT_AMBULATORY_CARE_PROVIDER_SITE_OTHER): Payer: BLUE CROSS/BLUE SHIELD | Admitting: Vascular Surgery

## 2017-02-12 ENCOUNTER — Ambulatory Visit (HOSPITAL_COMMUNITY)
Admission: RE | Admit: 2017-02-12 | Discharge: 2017-02-12 | Disposition: A | Discharge status: Home / Self Care | Payer: BLUE CROSS/BLUE SHIELD | Source: Ambulatory Visit | Attending: Vascular Surgery | Admitting: Vascular Surgery

## 2017-02-12 VITALS — BP 150/94 | HR 85 | Temp 97.3°F | Resp 18 | Ht 67.0 in | Wt 224.0 lb

## 2017-02-12 DIAGNOSIS — I6523 Occlusion and stenosis of bilateral carotid arteries: Secondary | ICD-10-CM

## 2017-02-12 LAB — VAS US CAROTID
LCCADSYS: -95 cm/s
LCCAPDIAS: 29 cm/s
LEFT ECA DIAS: -56 cm/s
LEFT VERTEBRAL DIAS: -4 cm/s
LICADSYS: -85 cm/s
LICAPDIAS: -39 cm/s
Left CCA dist dias: -34 cm/s
Left CCA prox sys: 108 cm/s
Left ICA dist dias: -34 cm/s
Left ICA prox sys: -104 cm/s
RCCADSYS: -108 cm/s
RCCAPDIAS: 16 cm/s
RCCAPSYS: 74 cm/s
RIGHT CCA MID DIAS: 9 cm/s
RIGHT ECA DIAS: -34 cm/s
RIGHT VERTEBRAL DIAS: -32 cm/s

## 2017-02-12 NOTE — Progress Notes (Signed)
[facility] [place]  Patient name: Erika [name] MRN: [id number] DOB: [birth year] Sex: female  REASON FOR VISIT: All up right carotid endarterectomy August 2017  HPI: Erika [name] is a 46 y.o. female here today for follow-up. She had combined coronary artery bypass grafting with Dr. Prescott Gum and right carotid endarterectomy by myself. She continues to have hypertension but his had no neurologic deficits and no new cardiac difficulty. She's had extensive workup to include renal arteriography with no identifiable source for severe hypertension. Unfortunately she continues to smoke cigarettes as well.  Past Medical History:  Diagnosis Date  . Arthralgia of temporomandibular joint   . CAD, multiple vessel    a. cath 06/29/16: ostLM 40%, ostLAD 40%, pLAD 95%, ost-pLCx 60%, pLCx 95%, mLCx 60%, mRCA 95%, D2 50%, LVSF nl;  b. 07/2016 CABG x 4 (LIMA->LAD, VG->Diag, VG->OM, VG->RCA).  . Carotid arterial disease ([place])    a. 07/2016 s/p R CEA.  . Depression   . Diastolic dysfunction    a.e cho 06/28/16: EF 50-55%, mild inf wall HK, GR1DD, mild MR, RV sys fxn nl, mildly dilated LA, PASP nl  . Fatty liver disease, nonalcoholic Q000111Q  . HLD (hyperlipidemia)   . Malignant hypertension   . Obesity   . PTSD (post-traumatic stress disorder)   . Tobacco abuse   . Type 2 diabetes mellitus [facility]) January 2017    Family History  Problem Relation Age of Onset  . Adopted: Yes  . Diabetes Mother   . Diabetes Father   . Alcohol abuse Father   . Heart disease Father   . Drug abuse Father   . Stroke Sister   . Anxiety disorder Sister     SOCIAL HISTORY: Social History  Substance Use Topics  . Smoking status: Current Every Day Smoker    Packs/day: 0.50    Types: Cigarettes    Last attempt to quit: 06/28/2016  . Smokeless tobacco: Never Used  . Alcohol use No    Allergies  Allergen Reactions  . No Known Allergies     Current  Outpatient Prescriptions  Medication Sig Dispense Refill  . aspirin EC 325 MG EC tablet Take 1 tablet (325 mg total) by mouth daily. 30 tablet 0  . atorvastatin (LIPITOR) 80 MG tablet Take 1 tablet (80 mg total) by mouth daily at 6 PM. 90 tablet 2  . Azilsartan-Chlorthalidone (EDARBYCLOR) 40-25 MG TABS Take 1 tablet by mouth daily. 90 tablet 3  . carvedilol (COREG) 25 MG tablet Take 1 tablet (25 mg total) by mouth 2 (two) times daily. 60 tablet 6  . clonazePAM (KLONOPIN) 0.5 MG tablet Take 1 tablet (0.5 mg total) by mouth 3 (three) times daily as needed for anxiety. 90 tablet 2  . FLUoxetine HCl 60 MG TABS Take 60 mg by mouth daily. 30 tablet 2  . HYDROcodone-acetaminophen (NORCO) 7.5-325 MG tablet Take 1 tablet by mouth every 6 (six) hours as needed for moderate pain. 60 tablet 0  . isosorbide mononitrate (IMDUR) 30 MG 24 hr tablet Take 1 tablet (30 mg total) by mouth daily. 30 tablet 6  . lamoTRIgine (LAMICTAL) 200 MG tablet Take 1 tablet (200 mg total) by mouth daily. 30 tablet 2  . metFORMIN (GLUCOPHAGE) 500 MG tablet TAKE ONE TABLET BY MOUTH ONCE DAILY WITH BREAKFAST 30 tablet 3  . nitroGLYCERIN (NITROSTAT) 0.4 MG SL tablet Place 1 tablet (0.4 mg total) under the tongue every 5 (five) minutes as needed for chest  pain. 30 tablet 0  . spironolactone (ALDACTONE) 25 MG tablet Take 1 tablet (25 mg total) by mouth daily. (Patient taking differently: Take 25 mg by mouth at bedtime. ) 30 tablet 3   No current facility-administered medications for this visit.     REVIEW OF SYSTEMS:  [X]  denotes positive finding, [ ]  denotes negative finding Cardiac  Comments:  Chest pain or chest pressure:    Shortness of breath upon exertion:    Short of breath when lying flat:    Irregular heart rhythm:        Vascular    Pain in calf, thigh, or hip brought on by ambulation:    Pain in feet at night that wakes you up from your sleep:     Blood clot in your veins:    Leg swelling:           PHYSICAL  EXAM: Vitals:   02/12/17 0944 02/12/17 0946  BP: (!) 153/97 (!) 150/94  Pulse: 85   Resp: 18   Temp: 97.3 F (36.3 C)   TempSrc: Oral   SpO2: 95%   Weight: 224 lb (101.6 kg)   Height: 5\' 7"  (1.702 m)     GENERAL: The patient is a well-nourished female, in no acute distress. The vital signs are documented above. CARDIOVASCULAR: Carotid incision is well-healed. She does have no keloid but a prominent incision. Is no carotid bruit. She does have a 2+ radial pulses bilaterally PULMONARY: There is good air exchange  MUSCULOSKELETAL: There are no major deformities or cyanosis. NEUROLOGIC: No focal weakness or paresthesias are detected. SKIN: There are no ulcers or rashes noted. PSYCHIATRIC: The patient has a normal affect.  DATA:  Carotid duplex today reveals widely patent endarterectomy site with no evidence of recurrent stenosis. She has some mild plaque but no stenosis in her left carotid  MEDICAL ISSUES: Stable follow-up. Encouraged her to continue working on her smoking cessation. We will see her again in one year with repeat carotid duplex    Rosetta Posner, MD [facility] Tel [phone] Pager [phone]

## 2017-02-15 NOTE — Addendum Note (Signed)
Addended by: Lianne Cure A on: 02/15/2017 03:03 PM   Modules accepted: Orders

## 2017-02-20 DIAGNOSIS — M9905 Segmental and somatic dysfunction of pelvic region: Secondary | ICD-10-CM | POA: Diagnosis not present

## 2017-02-20 DIAGNOSIS — M9903 Segmental and somatic dysfunction of lumbar region: Secondary | ICD-10-CM | POA: Diagnosis not present

## 2017-02-20 DIAGNOSIS — M9902 Segmental and somatic dysfunction of thoracic region: Secondary | ICD-10-CM | POA: Diagnosis not present

## 2017-02-20 DIAGNOSIS — M545 Low back pain: Secondary | ICD-10-CM | POA: Diagnosis not present

## 2017-02-25 ENCOUNTER — Encounter: Payer: Self-pay | Admitting: Family Medicine

## 2017-02-25 ENCOUNTER — Ambulatory Visit (INDEPENDENT_AMBULATORY_CARE_PROVIDER_SITE_OTHER): Payer: BLUE CROSS/BLUE SHIELD | Admitting: Family Medicine

## 2017-02-25 VITALS — BP 150/108 | HR 97 | Temp 97.9°F | Wt 228.0 lb

## 2017-02-25 DIAGNOSIS — I10 Essential (primary) hypertension: Secondary | ICD-10-CM

## 2017-02-25 DIAGNOSIS — G44001 Cluster headache syndrome, unspecified, intractable: Secondary | ICD-10-CM | POA: Diagnosis not present

## 2017-02-25 MED ORDER — HYDROCODONE-ACETAMINOPHEN 7.5-325 MG PO TABS: 1.0000 | ORAL_TABLET | Freq: Four times a day (QID) | ORAL | 0 refills | Status: DC | PRN

## 2017-02-25 NOTE — Progress Notes (Signed)
Pre visit review using our clinic review tool, if applicable. No additional management support is needed unless otherwise documented below in the visit note. 

## 2017-02-25 NOTE — Assessment & Plan Note (Signed)
Improved with home O2. Still requiring as needed Norco.  Rx printed and given to pt. I did review her in the controlled substances data base.  No red flags.

## 2017-02-25 NOTE — Progress Notes (Signed)
Subjective:   Patient ID: Erika [name], female    DOB: 1971-01-19, 46 y.o.   MRN: [id number]  Erika [name] is a pleasant 46 y.o. year old female who presents to clinic today with Headache (Routine follow-up for cluster headaches)  on 02/25/2017  HPI:   HTN- known h/o refractory HTN which has been much better since her CABG and right CEA in 06/2016.  Currently taking coreg, imdur and spironolactone. Losartan 50 mg daily was added 12/04/16 after consulting with Dr. Fletcher Anon.  She also had follow up with Dr. Fletcher Anon on 01/24/17.  Note reviewed. Losartan was switched to Edarbyclor 40/25 mg daily.  Headaches are still occurring very frequently.Takes norco but tries not to take it. Tramadol did not work.  I advised her to come in when she was having a bad headache so we could see if supplemental O2 here in the office may help which she did on 02/04/2017.  O2 in office did help so I ordered home oxygen for her to take as needed prn cluster headache. This has helped but still has refractory headaches requiring norco.   Current Outpatient Prescriptions on File Prior to Visit  Medication Sig Dispense Refill  . aspirin EC 325 MG EC tablet Take 1 tablet (325 mg total) by mouth daily. 30 tablet 0  . atorvastatin (LIPITOR) 80 MG tablet Take 1 tablet (80 mg total) by mouth daily at 6 PM. 90 tablet 2  . Azilsartan-Chlorthalidone (EDARBYCLOR) 40-25 MG TABS Take 1 tablet by mouth daily. 90 tablet 3  . carvedilol (COREG) 25 MG tablet Take 1 tablet (25 mg total) by mouth 2 (two) times daily. 60 tablet 6  . clonazePAM (KLONOPIN) 0.5 MG tablet Take 1 tablet (0.5 mg total) by mouth 3 (three) times daily as needed for anxiety. 90 tablet 2  . FLUoxetine HCl 60 MG TABS Take 60 mg by mouth daily. 30 tablet 2  . HYDROcodone-acetaminophen (NORCO) 7.5-325 MG tablet Take 1 tablet by mouth every 6 (six) hours as needed for moderate pain. 60 tablet 0  . isosorbide mononitrate (IMDUR) 30 MG 24 hr tablet  Take 1 tablet (30 mg total) by mouth daily. 30 tablet 6  . lamoTRIgine (LAMICTAL) 200 MG tablet Take 1 tablet (200 mg total) by mouth daily. 30 tablet 2  . metFORMIN (GLUCOPHAGE) 500 MG tablet TAKE ONE TABLET BY MOUTH ONCE DAILY WITH BREAKFAST 30 tablet 3  . nitroGLYCERIN (NITROSTAT) 0.4 MG SL tablet Place 1 tablet (0.4 mg total) under the tongue every 5 (five) minutes as needed for chest pain. 30 tablet 0   No current facility-administered medications on file prior to visit.     Allergies  Allergen Reactions  . No Known Allergies     Past Medical History:  Diagnosis Date  . Arthralgia of temporomandibular joint   . CAD, multiple vessel    a. cath 06/29/16: ostLM 40%, ostLAD 40%, pLAD 95%, ost-pLCx 60%, pLCx 95%, mLCx 60%, mRCA 95%, D2 50%, LVSF nl;  b. 07/2016 CABG x 4 (LIMA->LAD, VG->Diag, VG->OM, VG->RCA).  . Carotid arterial disease ([place])    a. 07/2016 s/p R CEA.  . Depression   . Diastolic dysfunction    a.e cho 06/28/16: EF 50-55%, mild inf wall HK, GR1DD, mild MR, RV sys fxn nl, mildly dilated LA, PASP nl  . Fatty liver disease, nonalcoholic Q000111Q  . HLD (hyperlipidemia)   . Malignant hypertension   . Obesity   . PTSD (post-traumatic stress disorder)   . Tobacco abuse   .  Type 2 diabetes mellitus [facility]) January 2017    Past Surgical History:  Procedure Laterality Date  . CARDIAC CATHETERIZATION [name] 06/29/2016   Procedure: Left Heart Cath and Coronary Angiography;  Surgeon: Minna Merritts, MD;  Location: [place] [facility];  Service: Cardiovascular;  Laterality: [name];  . CARDIAC CATHETERIZATION [name] 08/29/2016   Procedure: Left Heart Cath and Cors/Grafts Angiography;  Surgeon: Wellington Hampshire, MD;  Location: [place] [facility];  Service: Cardiovascular;  Laterality: [name];  . CESAREAN SECTION    . CHOLECYSTECTOMY    . CORONARY ARTERY BYPASS GRAFT [name] 07/06/2016   Procedure: CORONARY ARTERY BYPASS GRAFTING (CABG) x four, using left internal mammary artery and right leg greater  saphenous vein harvested endoscopically;  Surgeon: Ivin Poot, MD;  Location: [place];  Service: Open Heart Surgery;  Laterality: [name];  . ENDARTERECTOMY Right 07/06/2016   Procedure: ENDARTERECTOMY CAROTID;  Surgeon: Rosetta Posner, MD;  Location: [place];  Service: Vascular;  Laterality: Right;  . PERIPHERAL VASCULAR CATHETERIZATION [name] 04/18/2016   Procedure: Renal Angiography;  Surgeon: Wellington Hampshire, MD;  Location: [place] CV [facility];  Service: Cardiovascular;  Laterality: [name];  . TEE WITHOUT CARDIOVERSION [name] 07/06/2016   Procedure: TRANSESOPHAGEAL ECHOCARDIOGRAM (TEE);  Surgeon: Ivin Poot, MD;  Location: [place];  Service: Open Heart Surgery;  Laterality: [name];  . TONSILLECTOMY      Family History  Problem Relation Age of Onset  . Adopted: Yes  . Diabetes Mother   . Diabetes Father   . Alcohol abuse Father   . Heart disease Father   . Drug abuse Father   . Stroke Sister   . Anxiety disorder Sister     Social History   Social History  . Marital status: Married    Spouse name: [name]  . Number of children: [name]  . Years of education: [name]   Occupational History  . Not on file.   Social History Main Topics  . Smoking status: Current Every Day Smoker    Packs/day: 0.50    Types: Cigarettes    Last attempt to quit: 06/28/2016  . Smokeless tobacco: Never Used  . Alcohol use No  . Drug use: No  . Sexual activity: Yes    Birth control/ protection: None   Other Topics Concern  . Not on file   Social History Narrative  . No narrative on file   The PMH, PSH, Social History, Family History, Medications, and allergies have been reviewed in [facility], and have been updated if relevant.   Review of Systems  Neurological: Positive for headaches. Negative for dizziness, tremors, seizures, syncope, facial asymmetry, speech difficulty, weakness, light-headedness and numbness.  All other systems reviewed and are negative.      Objective:    BP (!) 150/108 (BP Location: Right Arm,  Patient Position: Sitting, Cuff Size: Large)   Pulse 97   Temp 97.9 F (36.6 C) (Oral)   Wt 228 lb (103.4 kg)   SpO2 96%   BMI 35.71 kg/m   BP Readings from Last 3 Encounters:  02/25/17 (!) 150/108  02/12/17 (!) 150/94  02/04/17 128/72     Physical Exam  Constitutional: She is oriented to person, place, and time. She appears well-developed and well-nourished. No distress.  HENT:  Head: Normocephalic and atraumatic.  Eyes: Conjunctivae are normal.  Cardiovascular: Normal rate.   Pulmonary/Chest: Effort normal.  Musculoskeletal: Normal range of motion. She exhibits no edema.  Neurological: She is alert and oriented to person,  place, and time. She displays normal reflexes. No cranial nerve deficit. Coordination normal.  Skin: Skin is warm and dry. She is not diaphoretic.  Psychiatric: She has a normal mood and affect. Her behavior is normal. Judgment and thought content normal.  Nursing note and vitals reviewed.         Assessment & Plan:   Malignant hypertension  Intractable cluster headache syndrome, unspecified chronicity pattern No Follow-up on file.

## 2017-03-05 DIAGNOSIS — G44009 Cluster headache syndrome, unspecified, not intractable: Secondary | ICD-10-CM | POA: Diagnosis not present

## 2017-03-08 DIAGNOSIS — M545 Low back pain: Secondary | ICD-10-CM | POA: Diagnosis not present

## 2017-03-08 DIAGNOSIS — M9902 Segmental and somatic dysfunction of thoracic region: Secondary | ICD-10-CM | POA: Diagnosis not present

## 2017-03-08 DIAGNOSIS — M9903 Segmental and somatic dysfunction of lumbar region: Secondary | ICD-10-CM | POA: Diagnosis not present

## 2017-03-08 DIAGNOSIS — M9905 Segmental and somatic dysfunction of pelvic region: Secondary | ICD-10-CM | POA: Diagnosis not present

## 2017-03-11 DIAGNOSIS — G8918 Other acute postprocedural pain: Secondary | ICD-10-CM | POA: Diagnosis not present

## 2017-03-21 ENCOUNTER — Encounter: Payer: Self-pay | Admitting: Family Medicine

## 2017-03-21 ENCOUNTER — Ambulatory Visit (INDEPENDENT_AMBULATORY_CARE_PROVIDER_SITE_OTHER): Payer: BLUE CROSS/BLUE SHIELD | Admitting: Family Medicine

## 2017-03-21 VITALS — BP 126/84 | HR 78 | Temp 97.8°F | Ht 67.0 in | Wt 224.0 lb

## 2017-03-21 DIAGNOSIS — G44001 Cluster headache syndrome, unspecified, intractable: Secondary | ICD-10-CM | POA: Diagnosis not present

## 2017-03-21 DIAGNOSIS — I119 Hypertensive heart disease without heart failure: Secondary | ICD-10-CM | POA: Diagnosis not present

## 2017-03-21 MED ORDER — HYDROCODONE-ACETAMINOPHEN 7.5-325 MG PO TABS: 1.0000 | ORAL_TABLET | Freq: Four times a day (QID) | ORAL | 0 refills | Status: DC | PRN

## 2017-03-21 NOTE — Assessment & Plan Note (Signed)
Improved some with home O2 but still requiring norco. Rx printed and given to pt.

## 2017-03-21 NOTE — Assessment & Plan Note (Signed)
BP remarkably good for her today.

## 2017-03-21 NOTE — Progress Notes (Signed)
Subjective:   Patient ID: Erika [name], female    DOB: [birth year], 46 y.o.   MRN: [id number]  Erika [name] is a pleasant 46 y.o. year old female who presents to clinic today with Headache  on 03/21/2017  HPI:   HTN- known h/o refractory HTN which has been much better since her CABG and right CEA in 06/2016.  Currently taking coreg, imdur and spironolactone. Losartan 50 mg daily was added 12/04/16 after consulting with Dr. Fletcher Anon.  She also had follow up with Dr. Fletcher Anon on 01/24/17.  Note reviewed. Losartan was switched to Edarbyclor 40/25 mg daily.  Headaches are still occurring frequently.Takes norco but tries not to take it. Tramadol did not work.  I advised her to come in when she was having a bad headache so we could see if supplemental O2 here in the office may help which she did on 02/04/2017.  O2 in office did help so I ordered home oxygen for her to take as needed prn cluster headache. This has helped for 30 minutes at a time but still has refractory headaches requiring norco.  She is also unsure how much longer her insurance will continue to pay for home oxygen.   Current Outpatient Prescriptions on File Prior to Visit  Medication Sig Dispense Refill  . aspirin EC 325 MG EC tablet Take 1 tablet (325 mg total) by mouth daily. 30 tablet 0  . atorvastatin (LIPITOR) 80 MG tablet Take 1 tablet (80 mg total) by mouth daily at 6 PM. 90 tablet 2  . Azilsartan-Chlorthalidone (EDARBYCLOR) 40-25 MG TABS Take 1 tablet by mouth daily. 90 tablet 3  . carvedilol (COREG) 25 MG tablet Take 1 tablet (25 mg total) by mouth 2 (two) times daily. 60 tablet 6  . clonazePAM (KLONOPIN) 0.5 MG tablet Take 1 tablet (0.5 mg total) by mouth 3 (three) times daily as needed for anxiety. 90 tablet 2  . FLUoxetine HCl 60 MG TABS Take 60 mg by mouth daily. 30 tablet 2  . isosorbide mononitrate (IMDUR) 30 MG 24 hr tablet Take 1 tablet (30 mg total) by mouth daily. 30 tablet 6  . lamoTRIgine  (LAMICTAL) 200 MG tablet Take 1 tablet (200 mg total) by mouth daily. 30 tablet 2  . metFORMIN (GLUCOPHAGE) 500 MG tablet TAKE ONE TABLET BY MOUTH ONCE DAILY WITH BREAKFAST 30 tablet 3  . nitroGLYCERIN (NITROSTAT) 0.4 MG SL tablet Place 1 tablet (0.4 mg total) under the tongue every 5 (five) minutes as needed for chest pain. 30 tablet 0   No current facility-administered medications on file prior to visit.     Allergies  Allergen Reactions  . No Known Allergies     Past Medical History:  Diagnosis Date  . Arthralgia of temporomandibular joint   . CAD, multiple vessel    a. cath 06/29/16: ostLM 40%, ostLAD 40%, pLAD 95%, ost-pLCx 60%, pLCx 95%, mLCx 60%, mRCA 95%, D2 50%, LVSF nl;  b. 07/2016 CABG x 4 (LIMA->LAD, VG->Diag, VG->OM, VG->RCA).  . Carotid arterial disease ([place])    a. 07/2016 s/p R CEA.  . Depression   . Diastolic dysfunction    a.e cho 06/28/16: EF 50-55%, mild inf wall HK, GR1DD, mild MR, RV sys fxn nl, mildly dilated LA, PASP nl  . Fatty liver disease, nonalcoholic 5284  . HLD (hyperlipidemia)   . Malignant hypertension   . Obesity   . PTSD (post-traumatic stress disorder)   . Tobacco abuse   . Type 2 diabetes mellitus (  [facility]) January 2017    Past Surgical History:  Procedure Laterality Date  . CARDIAC CATHETERIZATION [name] 06/29/2016   Procedure: Left Heart Cath and Coronary Angiography;  Surgeon: Minna Merritts, MD;  Location: [place] [facility];  Service: Cardiovascular;  Laterality: [name];  . CARDIAC CATHETERIZATION [name] 08/29/2016   Procedure: Left Heart Cath and Cors/Grafts Angiography;  Surgeon: Wellington Hampshire, MD;  Location: [place] [facility];  Service: Cardiovascular;  Laterality: [name];  . CESAREAN SECTION    . CHOLECYSTECTOMY    . CORONARY ARTERY BYPASS GRAFT [name] 07/06/2016   Procedure: CORONARY ARTERY BYPASS GRAFTING (CABG) x four, using left internal mammary artery and right leg greater saphenous vein harvested endoscopically;  Surgeon: Ivin Poot, MD;   Location: [place];  Service: Open Heart Surgery;  Laterality: [name];  . ENDARTERECTOMY Right 07/06/2016   Procedure: ENDARTERECTOMY CAROTID;  Surgeon: Rosetta Posner, MD;  Location: [place];  Service: Vascular;  Laterality: Right;  . PERIPHERAL VASCULAR CATHETERIZATION [name] 04/18/2016   Procedure: Renal Angiography;  Surgeon: Wellington Hampshire, MD;  Location: [place] [facility];  Service: Cardiovascular;  Laterality: [name];  . TEE WITHOUT CARDIOVERSION [name] 07/06/2016   Procedure: TRANSESOPHAGEAL ECHOCARDIOGRAM (TEE);  Surgeon: Ivin Poot, MD;  Location: [place];  Service: Open Heart Surgery;  Laterality: [name];  . TONSILLECTOMY      Family History  Problem Relation Age of Onset  . Adopted: Yes  . Diabetes Mother   . Diabetes Father   . Alcohol abuse Father   . Heart disease Father   . Drug abuse Father   . Stroke Sister   . Anxiety disorder Sister     Social History   Social History  . Marital status: Married    Spouse name: [name]  . Number of children: [name]  . Years of education: [name]   Occupational History  . Not on file.   Social History Main Topics  . Smoking status: Current Every Day Smoker    Packs/day: 0.50    Types: Cigarettes    Last attempt to quit: 06/28/2016  . Smokeless tobacco: Never Used  . Alcohol use No  . Drug use: No  . Sexual activity: Yes    Birth control/ protection: None   Other Topics Concern  . Not on file   Social History Narrative  . No narrative on file   The PMH, PSH, Social History, Family History, Medications, and allergies have been reviewed in [facility], and have been updated if relevant.   Review of Systems  Gastrointestinal: Negative.   Neurological: Positive for headaches. Negative for dizziness, tremors, seizures, syncope, facial asymmetry, speech difficulty, weakness, light-headedness and numbness.  All other systems reviewed and are negative.      Objective:    BP 126/84   Pulse 78   Temp 97.8 F (36.6 C)   Ht 5\' 7"  (1.702 m)   Wt 224 lb  (101.6 kg)   SpO2 99%   BMI 35.08 kg/m   BP Readings from Last 3 Encounters:  03/21/17 126/84  02/25/17 (!) 150/108  02/12/17 (!) 150/94     Physical Exam  Constitutional: She is oriented to person, place, and time. She appears well-developed and well-nourished. No distress.  HENT:  Head: Normocephalic and atraumatic.  Eyes: Conjunctivae are normal.  Cardiovascular: Normal rate.   Pulmonary/Chest: Effort normal.  Musculoskeletal: Normal range of motion. She exhibits no edema.  Neurological: She is alert and oriented to person, place, and time. She displays  normal reflexes. No cranial nerve deficit. Coordination normal.  Skin: Skin is warm and dry. She is not diaphoretic.  Psychiatric: She has a normal mood and affect. Her behavior is normal. Judgment and thought content normal.  Nursing note and vitals reviewed.         Assessment & Plan:   Hypertension, accelerated with heart disease, without CHF No Follow-up on file.

## 2017-04-05 ENCOUNTER — Encounter: Payer: Self-pay | Admitting: Cardiovascular Disease

## 2017-04-05 ENCOUNTER — Ambulatory Visit (INDEPENDENT_AMBULATORY_CARE_PROVIDER_SITE_OTHER): Payer: BLUE CROSS/BLUE SHIELD | Admitting: Cardiovascular Disease

## 2017-04-05 VITALS — BP 148/88 | HR 89 | Ht 67.0 in | Wt 227.8 lb

## 2017-04-05 DIAGNOSIS — Z720 Tobacco use: Secondary | ICD-10-CM

## 2017-04-05 DIAGNOSIS — G44009 Cluster headache syndrome, unspecified, not intractable: Secondary | ICD-10-CM | POA: Diagnosis not present

## 2017-04-05 DIAGNOSIS — I779 Disorder of arteries and arterioles, unspecified: Secondary | ICD-10-CM

## 2017-04-05 DIAGNOSIS — E785 Hyperlipidemia, unspecified: Secondary | ICD-10-CM

## 2017-04-05 DIAGNOSIS — I739 Peripheral vascular disease, unspecified: Secondary | ICD-10-CM

## 2017-04-05 DIAGNOSIS — I25118 Atherosclerotic heart disease of native coronary artery with other forms of angina pectoris: Secondary | ICD-10-CM | POA: Diagnosis not present

## 2017-04-05 DIAGNOSIS — I10 Essential (primary) hypertension: Secondary | ICD-10-CM | POA: Diagnosis not present

## 2017-04-05 MED ORDER — SPIRONOLACTONE 25 MG PO TABS: 12.5000 mg | ORAL_TABLET | Freq: Every day | ORAL | 3 refills | Status: DC

## 2017-04-05 NOTE — Progress Notes (Signed)
Cardiology Office Note   Date:  04/05/2017   ID:  Erika [name], DOB 07-03-1971, MRN [id number]  PCP:  Arnette Norris, MD Cardiologist:   Erika Sacramento, MD   Chief Complaint  Patient presents with  . other    2 month follow up. Patient states she is doing well just tired all the time. Meds reviewed verbally with patient.       History of Present Illness: Erika [name] is a 46 y.o. female who presents for a follow-up visit regarding Coronary artery disease .[name] with no evidence of secondary hypertension. She presented in July, 2017 with non-ST elevation myocardial infarction. She underwent cardiac catheterization which showed severe three-vessel coronary artery disease.  Preop carotid Doppler showed 80-99% right ICA stenosis. She underwent CABG and right carotid endarterectomy at the same time in July of 2017.  She was rehospitalized in September with chest pain in the setting of uncontrolled hypertension. Cardiac catheterization showed patent grafts . The LAD had diffuse disease distal to the anastomosis and was very small in caliber. Ejection fraction was normal with mildly elevated left ventricular end-diastolic pressure. Blood pressure medications were adjusted with improvement. During last visit, I switched losartan to azilsartan-chlorthalidone given that her blood pressure was extremely high. Blood pressure improved since then. She denies any chest pain or shortness of breath. Unfortunately, she continues to smoke.  Past Medical History:  Diagnosis Date  . Arthralgia of temporomandibular joint   . CAD, multiple vessel    a. cath 06/29/16: ostLM 40%, ostLAD 40%, pLAD 95%, ost-pLCx 60%, pLCx 95%, mLCx 60%, mRCA 95%, D2 50%, LVSF nl;  b. 07/2016 CABG x 4 (LIMA->LAD, VG->Diag, VG->OM, VG->RCA).  . Carotid arterial disease ([place])    a. 07/2016 s/p R CEA.  . Depression   . Diastolic dysfunction    a.e cho 06/28/16: EF 50-55%, mild inf  wall HK, GR1DD, mild MR, RV sys fxn nl, mildly dilated LA, PASP nl  . Fatty liver disease, nonalcoholic 0174  . HLD (hyperlipidemia)   . Malignant hypertension   . Obesity   . PTSD (post-traumatic stress disorder)   . Tobacco abuse   . Type 2 diabetes mellitus [facility]) January 2017    Past Surgical History:  Procedure Laterality Date  . CARDIAC CATHETERIZATION [name] 06/29/2016   Procedure: Left Heart Cath and Coronary Angiography;  Surgeon: Minna Merritts, MD;  Location: [place] [facility];  Service: Cardiovascular;  Laterality: [name];  . CARDIAC CATHETERIZATION [name] 08/29/2016   Procedure: Left Heart Cath and Cors/Grafts Angiography;  Surgeon: Wellington Hampshire, MD;  Location: [place] [facility];  Service: Cardiovascular;  Laterality: [name];  . CESAREAN SECTION    . CHOLECYSTECTOMY    . CORONARY ARTERY BYPASS GRAFT [name] 07/06/2016   Procedure: CORONARY ARTERY BYPASS GRAFTING (CABG) x four, using left internal mammary artery and right leg greater saphenous vein harvested endoscopically;  Surgeon: Ivin Poot, MD;  Location: [place];  Service: Open Heart Surgery;  Laterality: [name];  . ENDARTERECTOMY Right 07/06/2016   Procedure: ENDARTERECTOMY CAROTID;  Surgeon: Rosetta Posner, MD;  Location: [place];  Service: Vascular;  Laterality: Right;  . PERIPHERAL VASCULAR CATHETERIZATION [name] 04/18/2016   Procedure: Renal Angiography;  Surgeon: Wellington Hampshire, MD;  Location: [place] [facility];  Service: Cardiovascular;  Laterality: [name];  . TEE WITHOUT CARDIOVERSION [name] 07/06/2016   Procedure: TRANSESOPHAGEAL ECHOCARDIOGRAM (TEE);  Surgeon: Ivin Poot, MD;  Location: [place];  Service: Open Heart Surgery;  Laterality: [name];  . TONSILLECTOMY       Current Outpatient Prescriptions  Medication Sig Dispense Refill  . aspirin EC 325 MG EC tablet Take 1 tablet (325 mg total) by mouth daily. 30 tablet 0  . atorvastatin (LIPITOR) 80 MG tablet Take 1 tablet (80 mg total) by mouth daily at 6 PM. 90 tablet 2  .  Azilsartan-Chlorthalidone (EDARBYCLOR) 40-25 MG TABS Take 1 tablet by mouth daily. 90 tablet 3  . carvedilol (COREG) 25 MG tablet Take 1 tablet (25 mg total) by mouth 2 (two) times daily. 60 tablet 6  . clonazePAM (KLONOPIN) 0.5 MG tablet Take 1 tablet (0.5 mg total) by mouth 3 (three) times daily as needed for anxiety. 90 tablet 2  . FLUoxetine HCl 60 MG TABS Take 60 mg by mouth daily. 30 tablet 2  . HYDROcodone-acetaminophen (NORCO) 7.5-325 MG tablet Take 1 tablet by mouth every 6 (six) hours as needed for moderate pain. 60 tablet 0  . isosorbide mononitrate (IMDUR) 30 MG 24 hr tablet Take 1 tablet (30 mg total) by mouth daily. 30 tablet 6  . lamoTRIgine (LAMICTAL) 200 MG tablet Take 1 tablet (200 mg total) by mouth daily. 30 tablet 2  . metFORMIN (GLUCOPHAGE) 500 MG tablet TAKE ONE TABLET BY MOUTH ONCE DAILY WITH BREAKFAST 30 tablet 3  . nitroGLYCERIN (NITROSTAT) 0.4 MG SL tablet Place 1 tablet (0.4 mg total) under the tongue every 5 (five) minutes as needed for chest pain. 30 tablet 0   No current facility-administered medications for this visit.     Allergies:   No known allergies    Social History:  The patient  reports that she has been smoking Cigarettes.  She has been smoking about 0.50 packs per day. She has never used smokeless tobacco. She reports that she does not drink alcohol or use drugs.   Family History:  The patient's family history includes Alcohol abuse in her father; Anxiety disorder in her sister; Diabetes in her father and mother; Drug abuse in her father; Heart disease in her father; Stroke in her sister. She was adopted.    ROS:  Please see the history of present illness.   Otherwise, review of systems are positive for none.   All other systems are reviewed and negative.    PHYSICAL EXAM: VS:  BP (!) 148/88 (BP Location: Left Arm, Patient Position: Sitting, Cuff Size: Normal)   Pulse 89   Ht 5\' 7"  (1.702 m)   Wt 227 lb 12 oz (103.3 kg)   BMI 35.67 kg/m  , BMI  Body mass index is 35.67 kg/m. GEN: Well nourished, well developed, in no acute distress  HEENT: normal  Neck: no JVD, carotid bruits, or masses Cardiac: RRR; no  rubs, or gallops,no edema . There is 1/6 systolic flow murmur in the pulmonic area Respiratory:  clear to auscultation bilaterally, normal work of breathing GI: soft, nontender, nondistended, + BS MS: no deformity or atrophy  Skin: warm and dry, no rash Neuro:  Strength and sensation are intact Psych: euthymic mood, full affect    EKG:  EKG is  ordered today. EKG showed normal sinus rhythm with lateral T-wave changes suggestive of ischemia.   Recent Labs: 06/30/2016: B Natriuretic Peptide 120.7; TSH 1.485 07/07/2016: Magnesium 2.2 09/03/2016: ALT 38; Hemoglobin 12.4; Platelets 517.0 02/04/2017: BUN 11; Creatinine, Ser 0.73; Potassium 3.5; Sodium 136    Lipid Panel    Component Value Date/Time   CHOL 121 08/21/2016 0817  TRIG 164 (H) 08/21/2016 0817   HDL 24 (L) 08/21/2016 0817   CHOLHDL 5.0 (H) 08/21/2016 0817   CHOLHDL 9.5 06/28/2016 0544   VLDL UNABLE TO CALCULATE IF TRIGLYCERIDE OVER 400 mg/dL 06/28/2016 0544   LDLCALC 64 08/21/2016 0817   LDLDIRECT 177.1 04/22/2012 0809      Wt Readings from Last 3 Encounters:  04/05/17 227 lb 12 oz (103.3 kg)  03/21/17 224 lb (101.6 kg)  02/25/17 228 lb (103.4 kg)         ASSESSMENT AND PLAN:  1.  Coronary artery disease involving native coronary arteries wth stable angina: Status post CABG in July. Continue medical therapy. Continue blood pressure control, carvedilol and Imdur.  2. Carotid artery disease status post right carotid endarterectomy. Continue aggressive treatment of risk factors.She follows with [facility].  3. Essential hypertension: Blood pressure improved significantly after switching to Edarbyclor 40/25 mg once daily.  She is no longer taking spironolactone for unclear reasons. I resumed this at 12.5 mg once daily. Check basic metabolic profile in one  week.  4. Hyperlipidemia: Continue high dose atorvastatin.  Most recent lipid profile in August showed significant improvement with an LDL of 64 and a triglyceride of 164.  5. Tobacco use: I again discussed with her the importance of complete smoking cessation.   Disposition:   FU with me in 6 months  Signed,  Erika Sacramento, MD  04/05/2017 11:40 AM    [place]

## 2017-04-05 NOTE — Patient Instructions (Signed)
Medication Instructions:  Your physician has recommended you make the following change in your medication:  START taking spironolactone 12.5mg  once daily   Labwork: BMET in one week at [facility]  Testing/Procedures: none  Follow-Up: Your physician wants you to follow-up in: 6 months with Dr. Fletcher Anon.  You will receive a reminder letter in the mail two months in advance. If you don't receive a letter, please call our office to schedule the follow-up appointment.   Any Other Special Instructions Will Be Listed Below (If Applicable).     If you need a refill on your cardiac medications before your next appointment, please call your pharmacy.

## 2017-04-10 DIAGNOSIS — M545 Low back pain: Secondary | ICD-10-CM | POA: Diagnosis not present

## 2017-04-10 DIAGNOSIS — M542 Cervicalgia: Secondary | ICD-10-CM | POA: Diagnosis not present

## 2017-04-10 DIAGNOSIS — M9903 Segmental and somatic dysfunction of lumbar region: Secondary | ICD-10-CM | POA: Diagnosis not present

## 2017-04-10 DIAGNOSIS — M9901 Segmental and somatic dysfunction of cervical region: Secondary | ICD-10-CM | POA: Diagnosis not present

## 2017-04-12 ENCOUNTER — Other Ambulatory Visit
Admission: RE | Admit: 2017-04-12 | Discharge: 2017-04-12 | Disposition: A | Discharge status: Home / Self Care | Payer: BLUE CROSS/BLUE SHIELD | Source: Ambulatory Visit | Attending: Cardiovascular Disease | Admitting: Cardiovascular Disease

## 2017-04-12 DIAGNOSIS — M545 Low back pain: Secondary | ICD-10-CM | POA: Diagnosis not present

## 2017-04-12 DIAGNOSIS — M9901 Segmental and somatic dysfunction of cervical region: Secondary | ICD-10-CM | POA: Diagnosis not present

## 2017-04-12 DIAGNOSIS — M9903 Segmental and somatic dysfunction of lumbar region: Secondary | ICD-10-CM | POA: Diagnosis not present

## 2017-04-12 DIAGNOSIS — I10 Essential (primary) hypertension: Secondary | ICD-10-CM | POA: Diagnosis not present

## 2017-04-12 DIAGNOSIS — M542 Cervicalgia: Secondary | ICD-10-CM | POA: Diagnosis not present

## 2017-04-12 LAB — BASIC METABOLIC PANEL
Anion gap: 10 (ref 5–15)
BUN: 7 mg/dL (ref 6–20)
CALCIUM: 10.1 mg/dL (ref 8.9–10.3)
CO2: 26 mmol/L (ref 22–32)
Chloride: 96 mmol/L — ABNORMAL LOW (ref 101–111)
Creatinine, Ser: 0.66 mg/dL (ref 0.44–1.00)
GFR calc Af Amer: >60 mL/min (ref >60)
Glucose, Bld: 350 mg/dL — ABNORMAL HIGH (ref 65–99)
Potassium: 4.2 mmol/L (ref 3.5–5.1)
Sodium: 132 mmol/L — ABNORMAL LOW (ref 135–145)

## 2017-04-17 ENCOUNTER — Ambulatory Visit (INDEPENDENT_AMBULATORY_CARE_PROVIDER_SITE_OTHER): Payer: BLUE CROSS/BLUE SHIELD | Admitting: Psychiatry

## 2017-04-17 ENCOUNTER — Encounter (HOSPITAL_COMMUNITY): Payer: Self-pay | Admitting: Psychiatry

## 2017-04-17 VITALS — BP 148/100 | HR 98 | Ht 67.0 in | Wt 224.4 lb

## 2017-04-17 DIAGNOSIS — F331 Major depressive disorder, recurrent, moderate: Secondary | ICD-10-CM | POA: Diagnosis not present

## 2017-04-17 DIAGNOSIS — Z79899 Other long term (current) drug therapy: Secondary | ICD-10-CM | POA: Diagnosis not present

## 2017-04-17 DIAGNOSIS — Z813 Family history of other psychoactive substance abuse and dependence: Secondary | ICD-10-CM

## 2017-04-17 DIAGNOSIS — M542 Cervicalgia: Secondary | ICD-10-CM | POA: Diagnosis not present

## 2017-04-17 DIAGNOSIS — M9901 Segmental and somatic dysfunction of cervical region: Secondary | ICD-10-CM | POA: Diagnosis not present

## 2017-04-17 DIAGNOSIS — Z818 Family history of other mental and behavioral disorders: Secondary | ICD-10-CM | POA: Diagnosis not present

## 2017-04-17 DIAGNOSIS — F17210 Nicotine dependence, cigarettes, uncomplicated: Secondary | ICD-10-CM

## 2017-04-17 DIAGNOSIS — Z811 Family history of alcohol abuse and dependence: Secondary | ICD-10-CM

## 2017-04-17 DIAGNOSIS — F4310 Post-traumatic stress disorder, unspecified: Secondary | ICD-10-CM

## 2017-04-17 DIAGNOSIS — M9903 Segmental and somatic dysfunction of lumbar region: Secondary | ICD-10-CM | POA: Diagnosis not present

## 2017-04-17 DIAGNOSIS — M545 Low back pain: Secondary | ICD-10-CM | POA: Diagnosis not present

## 2017-04-17 DIAGNOSIS — F419 Anxiety disorder, unspecified: Secondary | ICD-10-CM | POA: Diagnosis not present

## 2017-04-17 DIAGNOSIS — Z7982 Long term (current) use of aspirin: Secondary | ICD-10-CM

## 2017-04-17 MED ORDER — CHLORPROMAZINE HCL 10 MG PO TABS: ORAL_TABLET | ORAL | 1 refills | Status: DC

## 2017-04-17 MED ORDER — LAMOTRIGINE 200 MG PO TABS
200.0000 mg | ORAL_TABLET | Freq: Every day | ORAL | 1 refills | Status: DC
Start: 2017-04-17 — End: 2017-05-29

## 2017-04-17 MED ORDER — FLUOXETINE HCL 60 MG PO TABS: 60.0000 mg | ORAL_TABLET | Freq: Every day | ORAL | 1 refills | Status: DC

## 2017-04-17 MED ORDER — CLONAZEPAM 0.5 MG PO TABS: 0.5000 mg | ORAL_TABLET | Freq: Three times a day (TID) | ORAL | 1 refills | Status: DC | PRN
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
## 2017-04-22 ENCOUNTER — Ambulatory Visit (INDEPENDENT_AMBULATORY_CARE_PROVIDER_SITE_OTHER): Payer: BLUE CROSS/BLUE SHIELD | Admitting: Family Medicine

## 2017-04-22 ENCOUNTER — Encounter: Payer: Self-pay | Admitting: Family Medicine

## 2017-04-22 VITALS — BP 104/70 | HR 89 | Temp 98.1°F | Wt 225.0 lb

## 2017-04-22 DIAGNOSIS — G44001 Cluster headache syndrome, unspecified, intractable: Secondary | ICD-10-CM | POA: Diagnosis not present

## 2017-04-22 DIAGNOSIS — I10 Essential (primary) hypertension: Secondary | ICD-10-CM | POA: Diagnosis not present

## 2017-04-22 MED ORDER — HYDROCODONE-ACETAMINOPHEN 7.5-325 MG PO TABS: 1.0000 | ORAL_TABLET | Freq: Four times a day (QID) | ORAL | 0 refills | Status: DC | PRN

## 2017-04-22 NOTE — Assessment & Plan Note (Signed)
Still requiring Norco. Rx printed and given to pt.

## 2017-04-22 NOTE — Progress Notes (Signed)
Pre visit review using our clinic review tool, if applicable. No additional management support is needed unless otherwise documented below in the visit note. 

## 2017-04-22 NOTE — Progress Notes (Signed)
Subjective:   Patient ID: Erika [name], female    DOB: 1971/09/03, 46 y.o.   MRN: [id number]  Erika [name] is a pleasant 46 y.o. year old female who presents to clinic today with Hypertension (Not checking at home often) and Headache  on 04/22/2017  HPI:   HTN- known h/o refractory HTN which has been much better since her CABG and right CEA in 06/2016.  Currently taking coreg, imdur and spironolactone. Losartan 50 mg daily was added 12/04/16 after consulting with Dr. Fletcher Anon.  She also had follow up with Dr. Fletcher Anon on 01/24/17.  Note reviewed. Losartan was switched to Edarbyclor 40/25 mg daily.  Headaches are still occurring frequently.Takes norco but tries not to take it. Tramadol did not work.  I advised her to come in when she was having a bad headache so we could see if supplemental O2 here in the office may help which she did on 02/04/2017.  O2 in office did help so I ordered home oxygen for her to take as needed prn cluster headache. But this has only helped for 30 minutes at a time.  Placed on thorazine from psychiatry 4 days ago.     Current Outpatient Prescriptions on File Prior to Visit  Medication Sig Dispense Refill  . aspirin EC 325 MG EC tablet Take 1 tablet (325 mg total) by mouth daily. 30 tablet 0  . atorvastatin (LIPITOR) 80 MG tablet Take 1 tablet (80 mg total) by mouth daily at 6 PM. 90 tablet 2  . Azilsartan-Chlorthalidone (EDARBYCLOR) 40-25 MG TABS Take 1 tablet by mouth daily. 90 tablet 3  . carvedilol (COREG) 25 MG tablet Take 1 tablet (25 mg total) by mouth 2 (two) times daily. 60 tablet 6  . chlorproMAZINE (THORAZINE) 10 MG tablet Take 1-2 tab at bed time 60 tablet 1  . clonazePAM (KLONOPIN) 0.5 MG tablet Take 1 tablet (0.5 mg total) by mouth 3 (three) times daily as needed for anxiety. 90 tablet 1  . FLUoxetine HCl 60 MG TABS Take 60 mg by mouth daily. 30 tablet 1  . isosorbide mononitrate (IMDUR) 30 MG 24 hr tablet Take 1 tablet (30 mg  total) by mouth daily. 30 tablet 6  . lamoTRIgine (LAMICTAL) 200 MG tablet Take 1 tablet (200 mg total) by mouth daily. 30 tablet 1  . metFORMIN (GLUCOPHAGE) 500 MG tablet TAKE ONE TABLET BY MOUTH ONCE DAILY WITH BREAKFAST 30 tablet 3  . nitroGLYCERIN (NITROSTAT) 0.4 MG SL tablet Place 1 tablet (0.4 mg total) under the tongue every 5 (five) minutes as needed for chest pain. 30 tablet 0  . spironolactone (ALDACTONE) 25 MG tablet Take 0.5 tablets (12.5 mg total) by mouth daily. 30 tablet 3   No current facility-administered medications on file prior to visit.     Allergies  Allergen Reactions  . No Known Allergies     Past Medical History:  Diagnosis Date  . Arthralgia of temporomandibular joint   . CAD, multiple vessel    a. cath 06/29/16: ostLM 40%, ostLAD 40%, pLAD 95%, ost-pLCx 60%, pLCx 95%, mLCx 60%, mRCA 95%, D2 50%, LVSF nl;  b. 07/2016 CABG x 4 (LIMA->LAD, VG->Diag, VG->OM, VG->RCA).  . Carotid arterial disease ([place])    a. 07/2016 s/p R CEA.  . Depression   . Diastolic dysfunction    a.e cho 06/28/16: EF 50-55%, mild inf wall HK, GR1DD, mild MR, RV sys fxn nl, mildly dilated LA, PASP nl  . Fatty liver disease, nonalcoholic 3662  .  HLD (hyperlipidemia)   . Malignant hypertension   . Obesity   . PTSD (post-traumatic stress disorder)   . Tobacco abuse   . Type 2 diabetes mellitus [facility]) January 2017    Past Surgical History:  Procedure Laterality Date  . CARDIAC CATHETERIZATION [name] 06/29/2016   Procedure: Left Heart Cath and Coronary Angiography;  Surgeon: Minna Merritts, MD;  Location: [place] CV LAB;  Service: Cardiovascular;  Laterality: [name];  . CARDIAC CATHETERIZATION [name] 08/29/2016   Procedure: Left Heart Cath and Cors/Grafts Angiography;  Surgeon: Wellington Hampshire, MD;  Location: [place] CV LAB;  Service: Cardiovascular;  Laterality: [name];  . CESAREAN SECTION    . CHOLECYSTECTOMY    . CORONARY ARTERY BYPASS GRAFT [name] 07/06/2016   Procedure: CORONARY ARTERY BYPASS  GRAFTING (CABG) x four, using left internal mammary artery and right leg greater saphenous vein harvested endoscopically;  Surgeon: Ivin Poot, MD;  Location: [place];  Service: Open Heart Surgery;  Laterality: [name];  . ENDARTERECTOMY Right 07/06/2016   Procedure: ENDARTERECTOMY CAROTID;  Surgeon: Rosetta Posner, MD;  Location: [place];  Service: Vascular;  Laterality: Right;  . PERIPHERAL VASCULAR CATHETERIZATION [name] 04/18/2016   Procedure: Renal Angiography;  Surgeon: Wellington Hampshire, MD;  Location: [place] CV LAB;  Service: Cardiovascular;  Laterality: [name];  . TEE WITHOUT CARDIOVERSION [name] 07/06/2016   Procedure: TRANSESOPHAGEAL ECHOCARDIOGRAM (TEE);  Surgeon: Ivin Poot, MD;  Location: [place];  Service: Open Heart Surgery;  Laterality: [name];  . TONSILLECTOMY      Family History  Problem Relation Age of Onset  . Adopted: Yes  . Diabetes Mother   . Diabetes Father   . Alcohol abuse Father   . Heart disease Father   . Drug abuse Father   . Stroke Sister   . Anxiety disorder Sister     Social History   Social History  . Marital status: Married    Spouse name: [name]  . Number of children: [name]  . Years of education: [name]   Occupational History  . Not on file.   Social History Main Topics  . Smoking status: Current Every Day Smoker    Packs/day: 0.50    Types: Cigarettes    Last attempt to quit: 06/28/2016  . Smokeless tobacco: Never Used  . Alcohol use No  . Drug use: No  . Sexual activity: Yes    Birth control/ protection: None   Other Topics Concern  . Not on file   Social History Narrative  . No narrative on file   The PMH, PSH, Social History, Family History, Medications, and allergies have been reviewed in [facility], and have been updated if relevant.   Review of Systems  Gastrointestinal: Negative.   Neurological: Positive for headaches. Negative for dizziness, tremors, seizures, syncope, facial asymmetry, speech difficulty, weakness, light-headedness and numbness.    All other systems reviewed and are negative.      Objective:    BP 104/70 (BP Location: Left Arm, Patient Position: Sitting, Cuff Size: Large)   Pulse 89   Temp 98.1 F (36.7 C) (Oral)   Wt 225 lb (102.1 kg)   SpO2 95%   BMI 35.24 kg/m   BP Readings from Last 3 Encounters:  04/22/17 104/70  04/05/17 (!) 148/88  03/21/17 126/84     Physical Exam  Constitutional: She is oriented to person, place, and time. She appears well-developed and well-nourished. No distress.  HENT:  Head: Normocephalic and atraumatic.  Eyes: Conjunctivae are  normal.  Cardiovascular: Normal rate.   Pulmonary/Chest: Effort normal.  Musculoskeletal: Normal range of motion. She exhibits no edema.  Neurological: She is alert and oriented to person, place, and time. She displays normal reflexes. No cranial nerve deficit. Coordination normal.  Skin: Skin is warm and dry. She is not diaphoretic.  Psychiatric: She has a normal mood and affect. Her behavior is normal. Judgment and thought content normal.  Nursing note and vitals reviewed.         Assessment & Plan:   HTN (hypertension), malignant  Intractable cluster headache syndrome, unspecified chronicity pattern No Follow-up on file.

## 2017-04-22 NOTE — Assessment & Plan Note (Signed)
Much better control. No changes made today. 

## 2017-05-05 DIAGNOSIS — G44009 Cluster headache syndrome, unspecified, not intractable: Secondary | ICD-10-CM | POA: Diagnosis not present

## 2017-05-16 ENCOUNTER — Encounter: Payer: Self-pay | Admitting: Family Medicine

## 2017-05-17 NOTE — Telephone Encounter (Signed)
Patient called to schedule appointment for today per Dr.Aron.  Our schedule is full today.  I offered patient an appointment at a [facility].  Patient said it would be too far away.  Patient doesn't want to go to an Urgent Care.  Patient said she's feeling better today. Patient said if she starts feeling like she did yesterday, she'll go to urgent care.  I moved patient's appointment with Dr.Aron from 05/27/17 to 05/21/17.

## 2017-05-19 ENCOUNTER — Inpatient Hospital Stay (HOSPITAL_COMMUNITY)
Admission: EM | Admit: 2017-05-19 | Discharge: 2017-05-20 | DRG: 871 | Disposition: A | Discharge status: Home / Self Care | Payer: BLUE CROSS/BLUE SHIELD | Attending: Family Medicine | Admitting: Family Medicine

## 2017-05-19 ENCOUNTER — Emergency Department (HOSPITAL_COMMUNITY): Payer: BLUE CROSS/BLUE SHIELD

## 2017-05-19 ENCOUNTER — Encounter (HOSPITAL_COMMUNITY): Payer: Self-pay | Admitting: *Deleted

## 2017-05-19 DIAGNOSIS — E1100 Type 2 diabetes mellitus with hyperosmolarity without nonketotic hyperglycemic-hyperosmolar coma (NKHHC): Secondary | ICD-10-CM | POA: Diagnosis not present

## 2017-05-19 DIAGNOSIS — R748 Abnormal levels of other serum enzymes: Secondary | ICD-10-CM

## 2017-05-19 DIAGNOSIS — Z794 Long term (current) use of insulin: Secondary | ICD-10-CM

## 2017-05-19 DIAGNOSIS — M542 Cervicalgia: Secondary | ICD-10-CM | POA: Diagnosis not present

## 2017-05-19 DIAGNOSIS — N179 Acute kidney failure, unspecified: Secondary | ICD-10-CM | POA: Diagnosis not present

## 2017-05-19 DIAGNOSIS — A419 Sepsis, unspecified organism: Secondary | ICD-10-CM | POA: Diagnosis not present

## 2017-05-19 DIAGNOSIS — I2510 Atherosclerotic heart disease of native coronary artery without angina pectoris: Secondary | ICD-10-CM | POA: Diagnosis not present

## 2017-05-19 DIAGNOSIS — I10 Essential (primary) hypertension: Secondary | ICD-10-CM | POA: Diagnosis present

## 2017-05-19 DIAGNOSIS — F17200 Nicotine dependence, unspecified, uncomplicated: Secondary | ICD-10-CM

## 2017-05-19 DIAGNOSIS — R42 Dizziness and giddiness: Secondary | ICD-10-CM | POA: Diagnosis not present

## 2017-05-19 DIAGNOSIS — Z720 Tobacco use: Secondary | ICD-10-CM

## 2017-05-19 DIAGNOSIS — E1101 Type 2 diabetes mellitus with hyperosmolarity with coma: Secondary | ICD-10-CM | POA: Diagnosis not present

## 2017-05-19 DIAGNOSIS — F331 Major depressive disorder, recurrent, moderate: Secondary | ICD-10-CM | POA: Diagnosis present

## 2017-05-19 DIAGNOSIS — R7989 Other specified abnormal findings of blood chemistry: Secondary | ICD-10-CM | POA: Diagnosis not present

## 2017-05-19 DIAGNOSIS — J449 Chronic obstructive pulmonary disease, unspecified: Secondary | ICD-10-CM | POA: Diagnosis not present

## 2017-05-19 DIAGNOSIS — F17210 Nicotine dependence, cigarettes, uncomplicated: Secondary | ICD-10-CM | POA: Diagnosis present

## 2017-05-19 DIAGNOSIS — F4310 Post-traumatic stress disorder, unspecified: Secondary | ICD-10-CM | POA: Diagnosis present

## 2017-05-19 DIAGNOSIS — Z951 Presence of aortocoronary bypass graft: Secondary | ICD-10-CM | POA: Diagnosis not present

## 2017-05-19 DIAGNOSIS — F17201 Nicotine dependence, unspecified, in remission: Secondary | ICD-10-CM | POA: Diagnosis present

## 2017-05-19 DIAGNOSIS — R778 Other specified abnormalities of plasma proteins: Secondary | ICD-10-CM

## 2017-05-19 DIAGNOSIS — Z7982 Long term (current) use of aspirin: Secondary | ICD-10-CM

## 2017-05-19 DIAGNOSIS — Z79899 Other long term (current) drug therapy: Secondary | ICD-10-CM

## 2017-05-19 DIAGNOSIS — E869 Volume depletion, unspecified: Secondary | ICD-10-CM | POA: Diagnosis not present

## 2017-05-19 DIAGNOSIS — R6520 Severe sepsis without septic shock: Secondary | ICD-10-CM | POA: Diagnosis present

## 2017-05-19 DIAGNOSIS — I119 Hypertensive heart disease without heart failure: Secondary | ICD-10-CM

## 2017-05-19 DIAGNOSIS — K760 Fatty (change of) liver, not elsewhere classified: Secondary | ICD-10-CM | POA: Diagnosis not present

## 2017-05-19 DIAGNOSIS — R0789 Other chest pain: Secondary | ICD-10-CM | POA: Diagnosis not present

## 2017-05-19 LAB — URINALYSIS, MICROSCOPIC (REFLEX)

## 2017-05-19 LAB — HEPATIC FUNCTION PANEL
ALT: 42 U/L (ref 14–54)
AST: 48 U/L — AB (ref 15–41)
Albumin: 3.5 g/dL (ref 3.5–5.0)
Alkaline Phosphatase: 69 U/L (ref 38–126)
BILIRUBIN DIRECT: 0.1 mg/dL (ref 0.1–0.5)
Indirect Bilirubin: 0.5 mg/dL (ref 0.3–0.9)
Total Bilirubin: 0.6 mg/dL (ref 0.3–1.2)
Total Protein: 7.2 g/dL (ref 6.5–8.1)

## 2017-05-19 LAB — BASIC METABOLIC PANEL
Anion gap: 15 (ref 5–15)
Anion gap: 8 (ref 5–15)
BUN: 14 mg/dL (ref 6–20)
BUN: 9 mg/dL (ref 6–20)
CALCIUM: 10.1 mg/dL (ref 8.9–10.3)
CHLORIDE: 92 mmol/L — AB (ref 101–111)
CO2: 28 mmol/L (ref 22–32)
CO2: 30 mmol/L (ref 22–32)
CREATININE: 0.91 mg/dL (ref 0.44–1.00)
Calcium: 9.6 mg/dL (ref 8.9–10.3)
Chloride: 85 mmol/L — ABNORMAL LOW (ref 101–111)
Creatinine, Ser: 1.36 mg/dL — ABNORMAL HIGH (ref 0.44–1.00)
GFR calc Af Amer: >60 mL/min (ref >60)
GFR calc non Af Amer: 47 mL/min — ABNORMAL LOW (ref >60)
GFR calc non Af Amer: >60 mL/min (ref >60)
GFR, EST AFRICAN AMERICAN: 54 mL/min — AB (ref >60)
GLUCOSE: 231 mg/dL — AB (ref 65–99)
Glucose, Bld: 503 mg/dL (ref 65–99)
POTASSIUM: 2.7 mmol/L — AB (ref 3.5–5.1)
Potassium: 3.3 mmol/L — ABNORMAL LOW (ref 3.5–5.1)
SODIUM: 130 mmol/L — AB (ref 135–145)
Sodium: 128 mmol/L — ABNORMAL LOW (ref 135–145)

## 2017-05-19 LAB — CBG MONITORING, ED
Glucose-Capillary: 378 mg/dL — ABNORMAL HIGH (ref 65–99)
Glucose-Capillary: 306 mg/dL — ABNORMAL HIGH (ref 65–99)

## 2017-05-19 LAB — CBC
HCT: 44.4 % (ref 36.0–46.0)
Hemoglobin: 15.5 g/dL — ABNORMAL HIGH (ref 12.0–15.0)
MCH: 30.5 pg (ref 26.0–34.0)
MCHC: 34.9 g/dL (ref 30.0–36.0)
MCV: 87.2 fL (ref 78.0–100.0)
PLATELETS: 389 10*3/uL (ref 150–400)
RBC: 5.09 MIL/uL (ref 3.87–5.11)
RDW: 13 % (ref 11.5–15.5)
WBC: 15.9 10*3/uL — ABNORMAL HIGH (ref 4.0–10.5)

## 2017-05-19 LAB — URINALYSIS, ROUTINE W REFLEX MICROSCOPIC
Bilirubin Urine: NEGATIVE
Hgb urine dipstick: NEGATIVE
Ketones, ur: NEGATIVE mg/dL
LEUKOCYTES UA: NEGATIVE
Nitrite: NEGATIVE
PH: 6 (ref 5.0–8.0)
Protein, ur: NEGATIVE mg/dL
Specific Gravity, Urine: <1.005 — ABNORMAL LOW (ref 1.005–1.030)

## 2017-05-19 LAB — PROTIME-INR
INR: 1
PROTHROMBIN TIME: 13.2 s (ref 11.4–15.2)

## 2017-05-19 LAB — I-STAT TROPONIN, ED: Troponin i, poc: 0.1 ng/mL (ref 0.00–0.08)

## 2017-05-19 LAB — GLUCOSE, CAPILLARY
Glucose-Capillary: 200 mg/dL — ABNORMAL HIGH (ref 65–99)
Glucose-Capillary: 226 mg/dL — ABNORMAL HIGH (ref 65–99)
Glucose-Capillary: 198 mg/dL — ABNORMAL HIGH (ref 65–99)
Glucose-Capillary: 156 mg/dL — ABNORMAL HIGH (ref 65–99)
Glucose-Capillary: 146 mg/dL — ABNORMAL HIGH (ref 65–99)
Glucose-Capillary: 156 mg/dL — ABNORMAL HIGH (ref 65–99)

## 2017-05-19 LAB — I-STAT CG4 LACTIC ACID, ED
LACTIC ACID, VENOUS: 3.86 mmol/L — AB (ref 0.5–1.9)
Lactic Acid, Venous: 5.03 mmol/L (ref 0.5–1.9)

## 2017-05-19 LAB — LACTIC ACID, PLASMA
LACTIC ACID, VENOUS: 2.3 mmol/L — AB (ref 0.5–1.9)
Lactic Acid, Venous: 2.7 mmol/L (ref 0.5–1.9)

## 2017-05-19 LAB — APTT: APTT: 26 s (ref 24–36)

## 2017-05-19 LAB — TROPONIN I: Troponin I: 0.09 ng/mL (ref <0.03)

## 2017-05-19 LAB — BRAIN NATRIURETIC PEPTIDE: B Natriuretic Peptide: 28 pg/mL (ref 0.0–100.0)

## 2017-05-19 LAB — PROCALCITONIN: Procalcitonin: 0.15 ng/mL

## 2017-05-19 MED ORDER — LAMOTRIGINE 100 MG PO TABS: 200.0000 mg | ORAL_TABLET | Freq: Every day | ORAL | Status: DC

## 2017-05-19 MED ORDER — SODIUM CHLORIDE 0.9 % IV BOLUS (SEPSIS)
500.0000 mL | Freq: Once | INTRAVENOUS | Status: AC
  Administered 2017-05-19: 500 mL via INTRAVENOUS

## 2017-05-19 MED ORDER — CHLORPROMAZINE HCL 10 MG PO TABS
10.0000 mg | ORAL_TABLET | Freq: Every day | ORAL | Status: DC
  Administered 2017-05-19: 10 mg via ORAL
  Filled 2017-05-19: qty 1

## 2017-05-19 MED ORDER — ENOXAPARIN SODIUM 40 MG/0.4ML ~~LOC~~ SOLN: 40.0000 mg | SUBCUTANEOUS | Status: DC

## 2017-05-19 MED ORDER — PIPERACILLIN-TAZOBACTAM 3.375 G IVPB
3.3750 g | Freq: Three times a day (TID) | INTRAVENOUS | Status: DC
  Administered 2017-05-19 – 2017-05-20 (×3): 3.375 g via INTRAVENOUS
  Filled 2017-05-19 (×4): qty 50

## 2017-05-19 MED ORDER — FLUOXETINE HCL 20 MG PO CAPS: 60.0000 mg | ORAL_CAPSULE | Freq: Every day | ORAL | Status: DC

## 2017-05-19 MED ORDER — CLONAZEPAM 0.5 MG PO TABS
0.5000 mg | ORAL_TABLET | Freq: Three times a day (TID) | ORAL | Status: DC | PRN
  Administered 2017-05-20: 0.5 mg via ORAL
  Filled 2017-05-19: qty 1

## 2017-05-19 MED ORDER — SODIUM CHLORIDE 0.9 % IV SOLN
INTRAVENOUS | Status: DC
  Filled 2017-05-19 (×2): qty 1000

## 2017-05-19 MED ORDER — VANCOMYCIN HCL IN DEXTROSE 750-5 MG/150ML-% IV SOLN
750.0000 mg | Freq: Two times a day (BID) | INTRAVENOUS | Status: DC
  Administered 2017-05-19: 750 mg via INTRAVENOUS
  Filled 2017-05-19 (×2): qty 150

## 2017-05-19 MED ORDER — LORAZEPAM 0.5 MG PO TABS
0.5000 mg | ORAL_TABLET | Freq: Once | ORAL | Status: AC
  Administered 2017-05-19: 0.5 mg via ORAL
  Filled 2017-05-19: qty 1

## 2017-05-19 MED ORDER — ACETAMINOPHEN 325 MG PO TABS: 650.0000 mg | ORAL_TABLET | Freq: Four times a day (QID) | ORAL | Status: DC | PRN

## 2017-05-19 MED ORDER — POTASSIUM CHLORIDE CRYS ER 20 MEQ PO TBCR
40.0000 meq | EXTENDED_RELEASE_TABLET | Freq: Once | ORAL | Status: AC
  Administered 2017-05-19: 40 meq via ORAL
  Filled 2017-05-19: qty 2

## 2017-05-19 MED ORDER — SODIUM CHLORIDE 0.9 % IV BOLUS (SEPSIS)
1000.0000 mL | Freq: Once | INTRAVENOUS | Status: AC
  Administered 2017-05-19: 1000 mL via INTRAVENOUS

## 2017-05-19 MED ORDER — VANCOMYCIN HCL IN DEXTROSE 1-5 GM/200ML-% IV SOLN
1000.0000 mg | INTRAVENOUS | Status: AC
  Administered 2017-05-19: 1000 mg via INTRAVENOUS
  Filled 2017-05-19: qty 200

## 2017-05-19 MED ORDER — SODIUM CHLORIDE 0.9 % IV SOLN
INTRAVENOUS | Status: DC
  Administered 2017-05-19: 3.2 [IU]/h via INTRAVENOUS
  Filled 2017-05-19: qty 1

## 2017-05-19 MED ORDER — ACETAMINOPHEN 650 MG RE SUPP: 650.0000 mg | Freq: Four times a day (QID) | RECTAL | Status: DC | PRN

## 2017-05-19 MED ORDER — ASPIRIN EC 325 MG PO TBEC
325.0000 mg | DELAYED_RELEASE_TABLET | Freq: Every day | ORAL | Status: DC
  Administered 2017-05-20: 325 mg via ORAL
  Filled 2017-05-19: qty 1

## 2017-05-19 MED ORDER — ENOXAPARIN SODIUM 60 MG/0.6ML ~~LOC~~ SOLN
0.5000 mg/kg | SUBCUTANEOUS | Status: DC
  Administered 2017-05-19: 50 mg via SUBCUTANEOUS
  Filled 2017-05-19: qty 0.6

## 2017-05-19 MED ORDER — SODIUM CHLORIDE 0.9 % IV SOLN
1000.0000 mL | INTRAVENOUS | Status: DC
  Administered 2017-05-19: 1000 mL via INTRAVENOUS

## 2017-05-19 MED ORDER — PIPERACILLIN-TAZOBACTAM 3.375 G IVPB 30 MIN
3.3750 g | Freq: Once | INTRAVENOUS | Status: AC
  Administered 2017-05-19: 3.375 g via INTRAVENOUS
  Filled 2017-05-19: qty 50

## 2017-05-19 MED ORDER — DEXTROSE-NACL 5-0.45 % IV SOLN
INTRAVENOUS | Status: DC
  Administered 2017-05-19 (×2): via INTRAVENOUS

## 2017-05-19 MED ORDER — SODIUM CHLORIDE 0.9 % IV SOLN
INTRAVENOUS | Status: DC
Start: 2017-05-19 — End: 2017-05-19
  Administered 2017-05-19: 14:00:00 via INTRAVENOUS

## 2017-05-19 MED ORDER — ASPIRIN 325 MG PO TABS
325.0000 mg | ORAL_TABLET | Freq: Once | ORAL | Status: AC
  Administered 2017-05-19: 325 mg via ORAL
  Filled 2017-05-19: qty 1

## 2017-05-19 MED ORDER — SODIUM CHLORIDE 0.9 % IV BOLUS (SEPSIS)
500.0000 mL | Freq: Once | INTRAVENOUS | Status: DC
Start: 2017-05-19 — End: 2017-05-19
  Administered 2017-05-19: 500 mL via INTRAVENOUS

## 2017-05-19 MED ORDER — VANCOMYCIN HCL IN DEXTROSE 1-5 GM/200ML-% IV SOLN
1000.0000 mg | Freq: Once | INTRAVENOUS | Status: DC
  Administered 2017-05-19: 1000 mg via INTRAVENOUS
  Filled 2017-05-19: qty 200

## 2017-05-19 MED ORDER — POTASSIUM CHLORIDE 10 MEQ/100ML IV SOLN
10.0000 meq | INTRAVENOUS | Status: AC
  Administered 2017-05-19: 10 meq via INTRAVENOUS
  Filled 2017-05-19 (×2): qty 100

## 2017-05-19 MED ORDER — POTASSIUM CHLORIDE IN NACL 40-0.9 MEQ/L-% IV SOLN
INTRAVENOUS | Status: DC
  Administered 2017-05-19: 125 mL/h via INTRAVENOUS
  Filled 2017-05-19 (×3): qty 1000

## 2017-05-19 MED ORDER — ATORVASTATIN CALCIUM 80 MG PO TABS
80.0000 mg | ORAL_TABLET | Freq: Every day | ORAL | Status: DC
  Administered 2017-05-19: 80 mg via ORAL
  Filled 2017-05-19: qty 1

## 2017-05-19 MED ORDER — POTASSIUM CHLORIDE 10 MEQ/100ML IV SOLN
10.0000 meq | INTRAVENOUS | Status: AC
  Administered 2017-05-19 (×2): 10 meq via INTRAVENOUS
  Filled 2017-05-19: qty 100

## 2017-05-19 MED ORDER — HYDROCODONE-ACETAMINOPHEN 7.5-325 MG PO TABS
1.0000 | ORAL_TABLET | Freq: Four times a day (QID) | ORAL | Status: DC | PRN
  Administered 2017-05-20 (×2): 1 via ORAL
  Filled 2017-05-19 (×2): qty 1

## 2017-05-19 MED ORDER — POTASSIUM CHLORIDE CRYS ER 20 MEQ PO TBCR
20.0000 meq | EXTENDED_RELEASE_TABLET | Freq: Once | ORAL | Status: AC
  Administered 2017-05-19: 20 meq via ORAL
  Filled 2017-05-19: qty 1

## 2017-05-19 MED ORDER — SODIUM CHLORIDE 0.9 % IV BOLUS (SEPSIS): 1000.0000 mL | Freq: Once | INTRAVENOUS | Status: DC

## 2017-05-19 MED ORDER — PIPERACILLIN-TAZOBACTAM 3.375 G IVPB 30 MIN: 3.3750 g | Freq: Three times a day (TID) | INTRAVENOUS | Status: DC

## 2017-05-19 NOTE — H&P (Addendum)
History and Physical  Erika [name] OBS:[id number] DOB: 1971/10/30 DOA: 05/19/2017   PCP: Lucille Passy, MD   Patient coming from: Home  Chief Complaint: dizziness, jaw discomfort  HPI:  Erika [name] is a 46 y.o. female with medical history of CAD/MI, COPD with continued tobacco abuse,  HLD, stable angina, and anxiety presented with dizziness and hypotension that began on 05/16/2017. The patient states that she had systolic blood pressure readings in the 80s at home on her automated blood pressure machine. She did not take her blood pressure medications on 05/16/2017 through 05/18/2017. However, when she checked her blood pressure on the morning of 05/19/2017, she noticed systolic blood pressure in the 200s on one single reading. As a result, she took all 4 of her blood pressure medications. Approximately 30-60 minutes later, her blood pressure was in the 80s. She came to the emergency department for further evaluation. Nevertheless, the patient has been having jaw and neck discomfort associated with exertion for the past 3-4 days. She denies any chest discomfort, shortness of breath, nausea, vomiting. She has had sweats for the past 3-4 days, but denies any frank fevers. She denies any headache, hemoptysis, vomiting, diarrhea, dysuria, hematuria, rashes. She denies any frank syncope. She states that she has had her usual nonproductive cough.  In the emergency department, the patient was noted to be hypotensive with systolic blood pressure 47/65. The patient was given 3 L normal saline with improvement of her blood pressure into the mid and upper 90s. Troponin was 0.10. BMP shows serum glucose of 503 with bicarbonate of 28. WBC was 15.9. Serum creatinine was 1.36. EKG shows sinus rhythm with ST depression V4-V6  Assessment/Plan: Sepsis -Present at the time of admission -Continue vancomycin and Zosyn pending culture data -Lactic acid. 5.03 -Trend procalcitonin -Continue IV  fluids  Hyperosmolar state -Patient presented with serum glucose of 503 with anion gap of 15, bicarbonate 28 -Start IV fluids -Start IV insulin -Hemoglobin A1c  Coronary artery disease with history of MI/stable angina -Troponin 0.10 -case discussed with Dr. Rayann Heman who agreed to see pt in consult--please call cardiology upon arrival -Continue statin -cycle troponins  Hypertension -Holding Coreg, Aldactone, ARB, HCTZ, and/or secondary to hypotension  Acute kidney injury -Secondary to sepsis and volume depletion -Baseline creatinine 0.6-0.7 -Present creatinine 1.36  Depression/PTSD -Continue home doses of Klonopin, fluoxetine, Lamictal  Tobacco abuse -cessation discussed    Past Medical History:  Diagnosis Date  . Arthralgia of temporomandibular joint   . CAD, multiple vessel    a. cath 06/29/16: ostLM 40%, ostLAD 40%, pLAD 95%, ost-pLCx 60%, pLCx 95%, mLCx 60%, mRCA 95%, D2 50%, LVSF nl;  b. 07/2016 CABG x 4 (LIMA->LAD, VG->Diag, VG->OM, VG->RCA).  . Carotid arterial disease ([place])    a. 07/2016 s/p R CEA.  . Depression   . Diastolic dysfunction    a.e cho 06/28/16: EF 50-55%, mild inf wall HK, GR1DD, mild MR, RV sys fxn nl, mildly dilated LA, PASP nl  . Fatty liver disease, nonalcoholic 4650  . HLD (hyperlipidemia)   . Malignant hypertension   . Obesity   . PTSD (post-traumatic stress disorder)   . Tobacco abuse   . Type 2 diabetes mellitus [facility]) January 2017   Past Surgical History:  Procedure Laterality Date  . CARDIAC CATHETERIZATION [name] 06/29/2016   Procedure: Left Heart Cath and Coronary Angiography;  Surgeon: Minna Merritts, MD;  Location: [place] [facility];  Service: Cardiovascular;  Laterality: [name];  .  CARDIAC CATHETERIZATION [name] 08/29/2016   Procedure: Left Heart Cath and Cors/Grafts Angiography;  Surgeon: Wellington Hampshire, MD;  Location: [place] [facility];  Service: Cardiovascular;  Laterality: [name];  . CESAREAN SECTION    . CHOLECYSTECTOMY    . CORONARY  ARTERY BYPASS GRAFT [name] 07/06/2016   Procedure: CORONARY ARTERY BYPASS GRAFTING (CABG) x four, using left internal mammary artery and right leg greater saphenous vein harvested endoscopically;  Surgeon: Ivin Poot, MD;  Location: [place];  Service: Open Heart Surgery;  Laterality: [name];  . ENDARTERECTOMY Right 07/06/2016   Procedure: ENDARTERECTOMY CAROTID;  Surgeon: Rosetta Posner, MD;  Location: [place];  Service: Vascular;  Laterality: Right;  . PERIPHERAL VASCULAR CATHETERIZATION [name] 04/18/2016   Procedure: Renal Angiography;  Surgeon: Wellington Hampshire, MD;  Location: [place] [facility];  Service: Cardiovascular;  Laterality: [name];  . TEE WITHOUT CARDIOVERSION [name] 07/06/2016   Procedure: TRANSESOPHAGEAL ECHOCARDIOGRAM (TEE);  Surgeon: Ivin Poot, MD;  Location: [place];  Service: Open Heart Surgery;  Laterality: [name];  . TONSILLECTOMY     Social History:  reports that she has been smoking Cigarettes.  She has been smoking about 0.50 packs per day. She has never used smokeless tobacco. She reports that she does not drink alcohol or use drugs.   Family History  Problem Relation Age of Onset  . Adopted: Yes  . Diabetes Mother   . Diabetes Father   . Alcohol abuse Father   . Heart disease Father   . Drug abuse Father   . Stroke Sister   . Anxiety disorder Sister      Allergies  Allergen Reactions  . No Known Allergies      Prior to Admission medications   Medication Sig Start Date End Date Taking? Authorizing Provider  aspirin EC 325 MG EC tablet Take 1 tablet (325 mg total) by mouth daily. 07/13/16  Yes Lars Pinks M, PA-C  atorvastatin (LIPITOR) 80 MG tablet Take 1 tablet (80 mg total) by mouth daily at 6 PM. 12/03/16  Yes Lucille Passy, MD  Azilsartan-Chlorthalidone (EDARBYCLOR) 40-25 MG TABS Take 1 tablet by mouth daily. 01/24/17  Yes Wellington Hampshire, MD  carvedilol (COREG) 25 MG tablet Take 1 tablet (25 mg total) by mouth 2 (two) times daily. 08/30/16  Yes Rogelia Mire, NP  chlorproMAZINE (THORAZINE) 10 MG tablet Take 1-2 tab at bed time 04/17/17  Yes Arfeen, Arlyce Harman, MD  clonazePAM (KLONOPIN) 0.5 MG tablet Take 1 tablet (0.5 mg total) by mouth 3 (three) times daily as needed for anxiety. 04/17/17  Yes Arfeen, Arlyce Harman, MD  FLUoxetine HCl 60 MG TABS Take 60 mg by mouth daily. 04/17/17  Yes Arfeen, Arlyce Harman, MD  HYDROcodone-acetaminophen (NORCO) 7.5-325 MG tablet Take 1 tablet by mouth every 6 (six) hours as needed for moderate pain. 04/22/17  Yes Lucille Passy, MD  isosorbide mononitrate (IMDUR) 30 MG 24 hr tablet Take 1 tablet (30 mg total) by mouth daily. 08/30/16  Yes Rogelia Mire, NP  lamoTRIgine (LAMICTAL) 200 MG tablet Take 1 tablet (200 mg total) by mouth daily. 04/17/17  Yes Arfeen, Arlyce Harman, MD  metFORMIN (GLUCOPHAGE) 500 MG tablet TAKE ONE TABLET BY MOUTH ONCE DAILY WITH BREAKFAST 01/24/17  Yes Lucille Passy, MD  nitroGLYCERIN (NITROSTAT) 0.4 MG SL tablet Place 1 tablet (0.4 mg total) under the tongue every 5 (five) minutes as needed for chest pain. 08/30/16  Yes Florencia Reasons, MD  spironolactone (ALDACTONE) 25  MG tablet Take 0.5 tablets (12.5 mg total) by mouth daily. Patient taking differently: Take 25 mg by mouth daily.  04/05/17 07/04/17 Yes Wellington Hampshire, MD    Review of Systems:  Constitutional:  No weight loss, night sweats, Fevers, chills, fatigue.  Head&Eyes: No headache.  No vision loss.  No eye pain or scotoma ENT:  No Difficulty swallowing,Tooth/dental problems,Sore throat,  No ear ache, post nasal drip,  Cardio-vascular:  No chest pain, Orthopnea, PND, swelling in lower extremities,  dizziness, palpitations  GI:  No  abdominal pain, nausea, vomiting, diarrhea, loss of appetite, hematochezia, melena, heartburn, indigestion, Resp:  No shortness of breath with exertion or at rest. No cough. No coughing up of blood .No wheezing.No chest wall deformity  Skin:  no rash or lesions.  GU:  no dysuria, change in color of urine, no urgency or  frequency. No flank pain.  Musculoskeletal:  No joint pain or swelling. No decreased range of motion. No back pain.  Psych:  No change in mood or affect. No depression or anxiety. Neurologic: No headache, no dysesthesia, no focal weakness, no vision loss. No syncope  Physical Exam: Vitals:   05/19/17 1057 05/19/17 1100 05/19/17 1115 05/19/17 1200  BP: (!) 81/50 (!) 72/53 (!) 88/55 103/68  Pulse: 88 81 79 86  Resp: 16 16 20  (!) 21  Temp:      TempSrc:      SpO2: 97% 93% 92% 93%  Weight:      Height:       General:  A&O x 3, NAD, nontoxic, pleasant/cooperative Head/Eye: No conjunctival hemorrhage, no icterus, [place]/AT, No nystagmus ENT:  No icterus,  No thrush, good dentition, no pharyngeal exudate Neck:  No masses, no lymphadenpathy, no bruits CV:  RRR, no rub, no gallop, no S3 Lung:  CTAB, good air movement, no wheeze, no rhonchi Abdomen: soft/NT, +BS, nondistended, no peritoneal signs Ext: No cyanosis, No rashes, No petechiae, No lymphangitis, No edema Neuro: CNII-XII intact, strength 4/5 in bilateral upper and lower extremities, no dysmetria  Labs on Admission:  Basic Metabolic Panel:  Recent Labs Lab 05/19/17 1026  NA 128*  K 3.3*  CL 85*  CO2 28  GLUCOSE 503*  BUN 14  CREATININE 1.36*  CALCIUM 10.1   Liver Function Tests:  Recent Labs Lab 05/19/17 1026  AST 48*  ALT 42  ALKPHOS 69  BILITOT 0.6  PROT 7.2  ALBUMIN 3.5   No results for input(s): LIPASE, AMYLASE in the last 168 hours. No results for input(s): AMMONIA in the last 168 hours. CBC:  Recent Labs Lab 05/19/17 1026  WBC 15.9*  HGB 15.5*  HCT 44.4  MCV 87.2  PLT 389   Coagulation Profile: No results for input(s): INR, PROTIME in the last 168 hours. Cardiac Enzymes: No results for input(s): CKTOTAL, CKMB, CKMBINDEX, TROPONINI in the last 168 hours. BNP: Invalid input(s): POCBNP CBG: No results for input(s): GLUCAP in the last 168 hours. Urine analysis:    Component Value Date/Time     COLORURINE YELLOW 06/29/2016 1222   APPEARANCEUR CLEAR 06/29/2016 1222   APPEARANCEUR Hazy 01/11/2015 1022   LABSPEC >1.046 (H) 06/29/2016 1222   LABSPEC 1.024 01/11/2015 1022   PHURINE 5.5 06/29/2016 1222   GLUCOSEU NEGATIVE 06/29/2016 1222   GLUCOSEU Negative 01/11/2015 1022   HGBUR MODERATE (A) 06/29/2016 1222   BILIRUBINUR NEGATIVE 06/29/2016 1222   BILIRUBINUR Negative 01/11/2015 [place] 06/29/2016 1222   PROTEINUR NEGATIVE 06/29/2016 1222   UROBILINOGEN  0.2 05/28/2009 2009   NITRITE NEGATIVE 06/29/2016 1222   LEUKOCYTESUR NEGATIVE 06/29/2016 1222   LEUKOCYTESUR Negative 01/11/2015 1022   Sepsis Labs: @LABRCNTIP (procalcitonin:4,lacticidven:4) ) Recent Results (from the past 240 hour(s))  Blood Culture (routine x 2)     Status: None (Preliminary result)   Collection Time: 05/19/17 11:23 AM  Result Value Ref Range Status   Specimen Description BLOOD RIGHT HAND  Final   Special Requests   Final    BOTTLES DRAWN AEROBIC AND ANAEROBIC Blood Culture adequate volume   Culture PENDING  Incomplete   Report Status PENDING  Incomplete  Blood Culture (routine x 2)     Status: None (Preliminary result)   Collection Time: 05/19/17 11:29 AM  Result Value Ref Range Status   Specimen Description RIGHT ANTECUBITAL  Final   Special Requests   Final    BOTTLES DRAWN AEROBIC AND ANAEROBIC Blood Culture adequate volume   Culture PENDING  Incomplete   Report Status PENDING  Incomplete     Radiological Exams on Admission: Dg Chest 2 View  Result Date: 05/19/2017 CLINICAL DATA:  Neck pain, throat pain, jaw pain, chest pressure EXAM: CHEST  2 VIEW COMPARISON:  08/28/2016 FINDINGS: There is no focal parenchymal opacity. There is no pleural effusion or pneumothorax. The heart and mediastinal contours are unremarkable. There is evidence of prior CABG. The osseous structures are unremarkable. IMPRESSION: No active cardiopulmonary disease. Electronically Signed   By: Kathreen Devoid   On: 05/19/2017 11:01    EKG: Independently reviewed. Sinus rhythm, ST depression V4 to V6.    Time spent:60 minutes Code Status:   FULL Family Communication:  Spouse updated at bedside Disposition Plan: expect 2-3 day hospitalization Consults called: Cardiology DVT Prophylaxis: [place] Lovenox  Ahron Hulbert, DO  [facility] Pager [phone]  If 7PM-7AM, please contact night-coverage [email or web address] Password [facility] 05/19/2017, 12:47 PM

## 2017-05-19 NOTE — ED Notes (Addendum)
CRITICAL VALUE ALERT  Critical Value:  Glucose 503  Date & Time Notified: 05/19/17   Provider Notified: Marc Morgans  Orders Received/Actions taken: MD made aware

## 2017-05-19 NOTE — Progress Notes (Signed)
Pharmacy Antibiotic Note  Erika [name] is a 46 y.o. female admitted on 05/19/2017 with sepsis.  Pharmacy has been consulted for vancomycin and zosyn dosing.  Plan: Vancomycin 2 gm total load then 750 mg IV q12 hours Cont zosyn 3.375 gm IV q8 hours F/u renal function, cultures and clinical course  Height: 5\' 7"  (170.2 cm) Weight: 224 lb (101.6 kg) IBW/kg (Calculated) : 61.6  Temp (24hrs), Avg:98.1 F (36.7 C), Min:98.1 F (36.7 C), Max:98.1 F (36.7 C)   Recent Labs Lab 05/19/17 1026 05/19/17 1102  WBC 15.9*  --   CREATININE 1.36*  --   LATICACIDVEN  --  5.03*    Estimated Creatinine Clearance: 65.3 mL/min (A) (by C-G formula based on SCr of 1.36 mg/dL (H)).    Allergies  Allergen Reactions  . No Known Allergies      Thank you for allowing pharmacy to be a part of this patient's care.  Excell Seltzer Poteet 05/19/2017 11:53 AM

## 2017-05-19 NOTE — Progress Notes (Signed)
Lactic acid 2.3 and potassium level 2.7 Medical team notified- On monitor SR no ectopy- will continue to monitor and follow orders.

## 2017-05-19 NOTE — ED Notes (Signed)
Pt out of bed to bathroom . 

## 2017-05-19 NOTE — ED Provider Notes (Signed)
[facility] DEPT Provider Note   CSN: [id number] Arrival date & time: 05/19/17  6629     History   Chief Complaint Chief Complaint  Patient presents with  . Dizziness    HPI Erika [name] is a 46 y.o. female.  HPI Erika [name] is a 46 y.o. female with coronary disease, malignant hypertension, carotid arterial disease, presents to emergency department complaining of hypotension, dizziness, jaw pressure. Patient states that her symptoms started 3 days ago with just generalized fatigue and malaise. She states this morning she has gotten more dizzy, especially upon standing. States that she checked her blood pressure this morning and it was 215/130, which is not unusual for her. She took her regular morning medications. She states approximately half an hour after she developed diaphoresis, and worsening dizziness. She states she checked her blood pressure and was in the 47M systolic consistently. She has rechecked several times and her blood pressure has been below 90 each time. She states this is very unusual for her. She denies any chest pain or pressure. She states she is having some discomfort in her jaw. She denies any abdominal pain. No nausea. No swelling in extremities. Denies any unusual activity yesterday, states she laid on the couch all day because she was not feeling well.   Past Medical History:  Diagnosis Date  . Arthralgia of temporomandibular joint   . CAD, multiple vessel    a. cath 06/29/16: ostLM 40%, ostLAD 40%, pLAD 95%, ost-pLCx 60%, pLCx 95%, mLCx 60%, mRCA 95%, D2 50%, LVSF nl;  b. 07/2016 CABG x 4 (LIMA->LAD, VG->Diag, VG->OM, VG->RCA).  . Carotid arterial disease ([place])    a. 07/2016 s/p R CEA.  . Depression   . Diastolic dysfunction    a.e cho 06/28/16: EF 50-55%, mild inf wall HK, GR1DD, mild MR, RV sys fxn nl, mildly dilated LA, PASP nl  . Fatty liver disease, nonalcoholic 5465  . HLD (hyperlipidemia)   . Malignant hypertension   . Obesity   .  PTSD (post-traumatic stress disorder)   . Tobacco abuse   . Type 2 diabetes mellitus [facility]) January 2017    Patient Active Problem List   Diagnosis Date Noted  . Major depressive disorder, recurrent episode, moderate ([place]) 11/19/2016  . Insomnia 10/30/2016  . Hypertension, accelerated with heart disease, without CHF   . Constipation 07/25/2016  . S/P CABG x 4 07/06/2016  . Bradycardia   . CAD (coronary artery disease)   . Carotid stenosis   . CAD in native artery 06/29/2016  . Elevated troponin 06/28/2016  . Essential hypertension, malignant 06/28/2016  . Tobacco abuse 06/28/2016  . Essential hypertension   . Malignant hypertension   . Diabetes ([place])   . Chest pain with high risk for cardiac etiology 06/27/2016  . NSTEMI (non-ST elevated myocardial infarction) ([place]) 06/27/2016  . Proteinuria 03/15/2016  . Renal artery stenosis ([place]) 03/15/2016  . MDD (major depressive disorder) 10/17/2015  . Agoraphobia with panic attacks 04/25/2015  . HTN (hypertension), malignant 10/20/2013  . Cluster headache 03/20/2012  . HLD (hyperlipidemia) 07/26/2010  . ADJUSTMENT DISORDER WITH MIXED FEATURES 07/26/2010    Past Surgical History:  Procedure Laterality Date  . CARDIAC CATHETERIZATION [name] 06/29/2016   Procedure: Left Heart Cath and Coronary Angiography;  Surgeon: Minna Merritts, MD;  Location: [place] CV LAB;  Service: Cardiovascular;  Laterality: [name];  . CARDIAC CATHETERIZATION [name] 08/29/2016   Procedure: Left Heart Cath and Cors/Grafts Angiography;  Surgeon: Wellington Hampshire, MD;  Location: [place] CV LAB;  Service: Cardiovascular;  Laterality: [name];  . CESAREAN SECTION    . CHOLECYSTECTOMY    . CORONARY ARTERY BYPASS GRAFT [name] 07/06/2016   Procedure: CORONARY ARTERY BYPASS GRAFTING (CABG) x four, using left internal mammary artery and right leg greater saphenous vein harvested endoscopically;  Surgeon: Ivin Poot, MD;  Location: [place];  Service: Open Heart Surgery;   Laterality: [name];  . ENDARTERECTOMY Right 07/06/2016   Procedure: ENDARTERECTOMY CAROTID;  Surgeon: Rosetta Posner, MD;  Location: [place];  Service: Vascular;  Laterality: Right;  . PERIPHERAL VASCULAR CATHETERIZATION [name] 04/18/2016   Procedure: Renal Angiography;  Surgeon: Wellington Hampshire, MD;  Location: [place] CV LAB;  Service: Cardiovascular;  Laterality: [name];  . TEE WITHOUT CARDIOVERSION [name] 07/06/2016   Procedure: TRANSESOPHAGEAL ECHOCARDIOGRAM (TEE);  Surgeon: Ivin Poot, MD;  Location: [place];  Service: Open Heart Surgery;  Laterality: [name];  . TONSILLECTOMY      OB History    No data available       Home Medications    Prior to Admission medications   Medication Sig Start Date End Date Taking? Authorizing Provider  aspirin EC 325 MG EC tablet Take 1 tablet (325 mg total) by mouth daily. 07/13/16   Nani Skillern, PA-C  atorvastatin (LIPITOR) 80 MG tablet Take 1 tablet (80 mg total) by mouth daily at 6 PM. 12/03/16   Lucille Passy, MD  Azilsartan-Chlorthalidone (EDARBYCLOR) 40-25 MG TABS Take 1 tablet by mouth daily. 01/24/17   Wellington Hampshire, MD  carvedilol (COREG) 25 MG tablet Take 1 tablet (25 mg total) by mouth 2 (two) times daily. 08/30/16   Rogelia Mire, NP  chlorproMAZINE (THORAZINE) 10 MG tablet Take 1-2 tab at bed time 04/17/17   Arfeen, Arlyce Harman, MD  clonazePAM (KLONOPIN) 0.5 MG tablet Take 1 tablet (0.5 mg total) by mouth 3 (three) times daily as needed for anxiety. 04/17/17   Arfeen, Arlyce Harman, MD  FLUoxetine HCl 60 MG TABS Take 60 mg by mouth daily. 04/17/17   Arfeen, Arlyce Harman, MD  HYDROcodone-acetaminophen (NORCO) 7.5-325 MG tablet Take 1 tablet by mouth every 6 (six) hours as needed for moderate pain. 04/22/17   Lucille Passy, MD  isosorbide mononitrate (IMDUR) 30 MG 24 hr tablet Take 1 tablet (30 mg total) by mouth daily. 08/30/16   Rogelia Mire, NP  lamoTRIgine (LAMICTAL) 200 MG tablet Take 1 tablet (200 mg total) by mouth daily. 04/17/17   Arfeen, Arlyce Harman, MD  metFORMIN (GLUCOPHAGE) 500 MG tablet TAKE ONE TABLET BY MOUTH ONCE DAILY WITH BREAKFAST 01/24/17   Lucille Passy, MD  nitroGLYCERIN (NITROSTAT) 0.4 MG SL tablet Place 1 tablet (0.4 mg total) under the tongue every 5 (five) minutes as needed for chest pain. 08/30/16   Florencia Reasons, MD  spironolactone (ALDACTONE) 25 MG tablet Take 0.5 tablets (12.5 mg total) by mouth daily. 04/05/17 07/04/17  Wellington Hampshire, MD    Family History Family History  Problem Relation Age of Onset  . Adopted: Yes  . Diabetes Mother   . Diabetes Father   . Alcohol abuse Father   . Heart disease Father   . Drug abuse Father   . Stroke Sister   . Anxiety disorder Sister     Social History Social History  Substance Use Topics  . Smoking status: Current Every Day Smoker    Packs/day: 0.50    Types: Cigarettes    Last attempt  to quit: 06/28/2016  . Smokeless tobacco: Never Used  . Alcohol use No     Allergies   No known allergies   Review of Systems Review of Systems  Constitutional: Positive for diaphoresis and fatigue. Negative for chills and fever.  Respiratory: Negative for cough, chest tightness and shortness of breath.   Cardiovascular: Negative for chest pain, palpitations and leg swelling.  Gastrointestinal: Negative for abdominal pain, diarrhea, nausea and vomiting.  Genitourinary: Negative for dysuria, flank pain and pelvic pain.  Musculoskeletal: Negative for arthralgias, myalgias, neck pain and neck stiffness.  Skin: Negative for rash.  Neurological: Positive for dizziness, weakness, light-headedness and headaches.  All other systems reviewed and are negative.    Physical Exam Updated Vital Signs BP (!) 84/54 (BP Location: Right Arm)   Pulse 86   Temp 98.1 F (36.7 C) (Oral)   Resp 18   Ht 5\' 7"  (1.702 m)   Wt 101.6 kg (224 lb)   SpO2 97%   BMI 35.08 kg/m   Physical Exam  Constitutional: She appears well-developed and well-nourished. No distress.  HENT:  Head:  Normocephalic.  Eyes: Conjunctivae are normal.  Neck: Neck supple.  Cardiovascular: Normal rate, regular rhythm and normal heart sounds.   Pulmonary/Chest: Effort normal and breath sounds normal. No respiratory distress. She has no wheezes. She has no rales.  Abdominal: Soft. Bowel sounds are normal. She exhibits no distension. There is no tenderness. There is no rebound.  Musculoskeletal: She exhibits no edema.  Neurological: She is alert.  Skin: Skin is warm and dry.  Psychiatric: She has a normal mood and affect. Her behavior is normal.  Nursing note and vitals reviewed.    ED Treatments / Results  Labs (all labs ordered are listed, but only abnormal results are displayed) Labs Reviewed  CBC - Abnormal; Notable for the following:       Result Value   WBC 15.9 (*)    Hemoglobin 15.5 (*)    All other components within normal limits  I-STAT TROPOININ, ED - Abnormal; Notable for the following:    Troponin i, poc 0.10 (*)    All other components within normal limits  I-STAT CG4 LACTIC ACID, ED - Abnormal; Notable for the following:    Lactic Acid, Venous 5.03 (*)    All other components within normal limits  CULTURE, BLOOD (ROUTINE X 2)  CULTURE, BLOOD (ROUTINE X 2)  URINE CULTURE  BASIC METABOLIC PANEL  BRAIN NATRIURETIC PEPTIDE  URINALYSIS, ROUTINE W REFLEX MICROSCOPIC    EKG  EKG Interpretation  Date/Time:  Sunday May 19 2017 10:17:57 EDT Ventricular Rate:  82 PR Interval:    QRS Duration: 102 QT Interval:  421 QTC Calculation: 492 R Axis:   75 Text Interpretation:  Sinus rhythm Probable anteroseptal infarct, recent Abnormal T, consider ischemia, diffuse leads Lateral leads are also involved Baseline wander in lead(s) V2 No significant change since last tracing            Confirmed by RAY MD, Andee Poles [phone]) on 05/19/2017 10:33:24 AM       Radiology Dg Chest 2 View  Result Date: 05/19/2017 CLINICAL DATA:  Neck pain, throat pain, jaw pain, chest pressure EXAM:  CHEST  2 VIEW COMPARISON:  08/28/2016 FINDINGS: There is no focal parenchymal opacity. There is no pleural effusion or pneumothorax. The heart and mediastinal contours are unremarkable. There is evidence of prior CABG. The osseous structures are unremarkable. IMPRESSION: No active cardiopulmonary disease. Electronically Signed   By: Elbert Ewings  Patel   On: 05/19/2017 11:01    Procedures Procedures (including critical care time)  CRITICAL CARE Performed by: Jeannett Senior A Total critical care time: 30 minutes Critical care time was exclusive of separately billable procedures and treating other patients. Critical care was necessary to treat or prevent imminent or life-threatening deterioration. Critical care was time spent personally by me on the following activities: development of treatment plan with patient and/or surrogate as well as nursing, discussions with consultants, evaluation of patient's response to treatment, examination of patient, obtaining history from patient or surrogate, ordering and performing treatments and interventions, ordering and review of laboratory studies, ordering and review of radiographic studies, pulse oximetry and re-evaluation of patient's condition.  Medications Ordered in ED Medications  sodium chloride 0.9 % bolus 500 mL (not administered)     Initial Impression / Assessment and Plan / ED Course  I have reviewed the triage vital signs and the nursing notes.  Pertinent labs & imaging results that were available during my care of the patient were reviewed by me and considered in my medical decision making (see chart for details).    Patient seen and examined. Patient with extensive cardiac disease, history of quadruple bypass, here with hypotension, dizziness, diaphoresis. Will get EKG, labs, chest x-ray. Last blood pressure is 90 systolic. Will give a small bolus of fluid. No fever, no infectious symptoms.  11:24 AM Patient persistently hypotensive.  Lactic acid is 5.03. Troponin elevated 0.1. With lactic acid of 5 and hypotension, will make patient could sepsis with IV fluids and antibiotics ordered. Chest x-ray showing no fluid overload, last EF is 55-65% on 08/29/16. Patient is chest pain-free.  Sepsis - Repeat Assessment  Performed at:    12:00 PM  Vitals     Blood pressure (!) 102/70, pulse 79, temperature 98.1 F (36.7 C), temperature source Oral, resp. rate 20, height 5\' 7"  (1.702 m), weight 101.6 kg (224 lb), SpO2 92 %.  Heart:     Regular rate and rhythm  Lungs:    CTA  Capillary Refill:   <2 sec  Peripheral Pulse:   Radial pulse palpable  Skin:     Normal Color   Patient's blood pressure is improving. Discussed patient with Dr. Carles Collet with [facility], asked his face with cardiology. They will admit transferred to [facility].  Spoke with Dr. Rayann Heman to did not think that this was a cardiac issue however he asked me to see if the hospitalist will look at patient and if they still would like cardiology help to call him back and he would be happy to see the patient. I discussed this with Dr. Carles Collet who agree.      Final diagnoses:  Sepsis, due to unspecified organism [facility])  Elevated troponin    New Prescriptions Discharge Medication List as of 05/20/2017  3:36 PM    START taking these medications   Details  insulin NPH-regular Human (NOVOLIN 70/30) (70-30) 100 UNIT/ML injection Take 5 UNITs twice a day and follow with Primary MD, Normal    INSULIN SYRINGE .5CC/29G 29G X 1/2" 0.5 ML MISC 1 Syringe by Does not apply route 2 (two) times daily., Starting Mon 05/20/2017, Until Wed 06/19/2017, Normal    Insulin Syringe-Needle U-100 29G X 1/2" 0.3 ML MISC 1 each by Does not apply route 2 (two) times daily., Starting Mon 05/20/2017, Normal         Marc Morgans [place], [place] 05/20/17 1611    Pattricia Boss, MD 05/20/17 [phone]

## 2017-05-19 NOTE — ED Triage Notes (Signed)
Pt comes in for dizziness, neck pain, and pain in her head. She states she has had low blood pressures over the last day. Pt bp 84/54 in triage. Pt is alert and oriented

## 2017-05-20 DIAGNOSIS — I2510 Atherosclerotic heart disease of native coronary artery without angina pectoris: Secondary | ICD-10-CM

## 2017-05-20 LAB — BASIC METABOLIC PANEL
ANION GAP: 8 (ref 5–15)
BUN: 10 mg/dL (ref 6–20)
CALCIUM: 9.7 mg/dL (ref 8.9–10.3)
CO2: 29 mmol/L (ref 22–32)
Chloride: 95 mmol/L — ABNORMAL LOW (ref 101–111)
Creatinine, Ser: 0.82 mg/dL (ref 0.44–1.00)
GFR calc Af Amer: >60 mL/min (ref >60)
GFR calc non Af Amer: >60 mL/min (ref >60)
GLUCOSE: 150 mg/dL — AB (ref 65–99)
Potassium: 3 mmol/L — ABNORMAL LOW (ref 3.5–5.1)
Sodium: 132 mmol/L — ABNORMAL LOW (ref 135–145)

## 2017-05-20 LAB — URINALYSIS, COMPLETE (UACMP) WITH MICROSCOPIC
BACTERIA UA: NONE SEEN
BILIRUBIN URINE: NEGATIVE
Glucose, UA: >=500 mg/dL — AB
HGB URINE DIPSTICK: NEGATIVE
Ketones, ur: NEGATIVE mg/dL
LEUKOCYTES UA: NEGATIVE
NITRITE: NEGATIVE
PROTEIN: NEGATIVE mg/dL
Specific Gravity, Urine: 1.014 (ref 1.005–1.030)
pH: 6 (ref 5.0–8.0)

## 2017-05-20 LAB — MAGNESIUM: MAGNESIUM: 1.6 mg/dL — AB (ref 1.7–2.4)

## 2017-05-20 LAB — GLUCOSE, CAPILLARY
GLUCOSE-CAPILLARY: 143 mg/dL — AB (ref 65–99)
GLUCOSE-CAPILLARY: 162 mg/dL — AB (ref 65–99)
GLUCOSE-CAPILLARY: 189 mg/dL — AB (ref 65–99)
GLUCOSE-CAPILLARY: 165 mg/dL — AB (ref 65–99)
GLUCOSE-CAPILLARY: 170 mg/dL — AB (ref 65–99)
GLUCOSE-CAPILLARY: 153 mg/dL — AB (ref 65–99)
GLUCOSE-CAPILLARY: 123 mg/dL — AB (ref 65–99)
GLUCOSE-CAPILLARY: 214 mg/dL — AB (ref 65–99)
Glucose-Capillary: 130 mg/dL — ABNORMAL HIGH (ref 65–99)
Glucose-Capillary: 132 mg/dL — ABNORMAL HIGH (ref 65–99)
Glucose-Capillary: 173 mg/dL — ABNORMAL HIGH (ref 65–99)
Glucose-Capillary: 174 mg/dL — ABNORMAL HIGH (ref 65–99)
Glucose-Capillary: 185 mg/dL — ABNORMAL HIGH (ref 65–99)
Glucose-Capillary: 186 mg/dL — ABNORMAL HIGH (ref 65–99)
Glucose-Capillary: 192 mg/dL — ABNORMAL HIGH (ref 65–99)

## 2017-05-20 LAB — TROPONIN I: TROPONIN I: 0.11 ng/mL — AB (ref <0.03)

## 2017-05-20 LAB — COMPREHENSIVE METABOLIC PANEL
ALBUMIN: 2.8 g/dL — AB (ref 3.5–5.0)
ALT: 32 U/L (ref 14–54)
ANION GAP: 9 (ref 5–15)
AST: 34 U/L (ref 15–41)
Alkaline Phosphatase: 54 U/L (ref 38–126)
BILIRUBIN TOTAL: 0.7 mg/dL (ref 0.3–1.2)
BUN: 9 mg/dL (ref 6–20)
CO2: 28 mmol/L (ref 22–32)
Calcium: 9.3 mg/dL (ref 8.9–10.3)
Chloride: 97 mmol/L — ABNORMAL LOW (ref 101–111)
Creatinine, Ser: 0.79 mg/dL (ref 0.44–1.00)
GFR calc Af Amer: >60 mL/min (ref >60)
GFR calc non Af Amer: >60 mL/min (ref >60)
GLUCOSE: 191 mg/dL — AB (ref 65–99)
POTASSIUM: 3.3 mmol/L — AB (ref 3.5–5.1)
Sodium: 134 mmol/L — ABNORMAL LOW (ref 135–145)
TOTAL PROTEIN: 5.9 g/dL — AB (ref 6.5–8.1)

## 2017-05-20 LAB — URINE CULTURE: Culture: NO GROWTH

## 2017-05-20 LAB — CBC
HEMATOCRIT: 39.7 % (ref 36.0–46.0)
Hemoglobin: 13.1 g/dL (ref 12.0–15.0)
MCH: 29.5 pg (ref 26.0–34.0)
MCHC: 33 g/dL (ref 30.0–36.0)
MCV: 89.4 fL (ref 78.0–100.0)
PLATELETS: 335 10*3/uL (ref 150–400)
RBC: 4.44 MIL/uL (ref 3.87–5.11)
RDW: 13.4 % (ref 11.5–15.5)
WBC: 13.8 10*3/uL — ABNORMAL HIGH (ref 4.0–10.5)

## 2017-05-20 LAB — PHOSPHORUS: Phosphorus: 3.1 mg/dL (ref 2.5–4.6)

## 2017-05-20 LAB — HIV ANTIBODY (ROUTINE TESTING W REFLEX): HIV Screen 4th Generation wRfx: NONREACTIVE

## 2017-05-20 MED ORDER — INSULIN ASPART 100 UNIT/ML ~~LOC~~ SOLN
0.0000 [IU] | Freq: Three times a day (TID) | SUBCUTANEOUS | Status: DC
  Administered 2017-05-20: 3 [IU] via SUBCUTANEOUS

## 2017-05-20 MED ORDER — "INSULIN SYRINGE 29G X 1/2"" 0.5 ML MISC": 1.0000 | Freq: Two times a day (BID) | 1 refills | Status: AC

## 2017-05-20 MED ORDER — MAGNESIUM SULFATE 4 GM/100ML IV SOLN
4.0000 g | Freq: Once | INTRAVENOUS | Status: AC
  Administered 2017-05-20: 4 g via INTRAVENOUS
  Filled 2017-05-20: qty 100

## 2017-05-20 MED ORDER — INSULIN NPH ISOPHANE & REGULAR (70-30) 100 UNIT/ML ~~LOC~~ SUSP: SUBCUTANEOUS | 3 refills | Status: DC

## 2017-05-20 MED ORDER — "INSULIN SYRINGE-NEEDLE U-100 29G X 1/2"" 0.3 ML MISC": 1.0000 | Freq: Two times a day (BID) | 0 refills | Status: DC

## 2017-05-20 MED ORDER — INSULIN ASPART 100 UNIT/ML ~~LOC~~ SOLN
3.0000 [IU] | Freq: Three times a day (TID) | SUBCUTANEOUS | Status: DC
Start: 2017-05-20 — End: 2017-05-20

## 2017-05-20 MED ORDER — VANCOMYCIN HCL IN DEXTROSE 1-5 GM/200ML-% IV SOLN
1000.0000 mg | Freq: Three times a day (TID) | INTRAVENOUS | Status: DC
  Administered 2017-05-20: 1000 mg via INTRAVENOUS
  Filled 2017-05-20 (×2): qty 200

## 2017-05-20 NOTE — Progress Notes (Signed)
Pharmacy Antibiotic Note  Erika [name] is a 46 y.o. female admitted on 05/19/2017 with sepsis.  Pharmacy has been consulted for vancomycin and zosyn dosing. Renal function has improved from ~65 ml/min to >100 ml/min, SCr closer to baseline and UOP ~0.5cc/kg/hr.   Plan: -Continue Zosyn 3.375 gm IV q8 hours -Increase vancomycin to 1000mg  IV q8h per nomogram -Monitor LOT, culture, renal funx closely -Consider VT once at new SS if continued  Height: 5\' 7"  (170.2 cm) Weight: 223 lb 14.4 oz (101.6 kg) IBW/kg (Calculated) : 61.6  Temp (24hrs), Avg:98.1 F (36.7 C), Min:97.7 F (36.5 C), Max:98.3 F (36.8 C)   Recent Labs Lab 05/19/17 1026 05/19/17 1102 05/19/17 1502 05/19/17 1628 05/19/17 1858 05/19/17 2313 05/20/17 0502  WBC 15.9*  --   --   --   --   --  13.8*  CREATININE 1.36*  --   --   --  0.91 0.82 0.79  LATICACIDVEN  --  5.03* 3.86* 2.7* 2.3*  --   --     Estimated Creatinine Clearance: 111.1 mL/min (by C-G formula based on SCr of 0.79 mg/dL).    Allergies  Allergen Reactions  . No Known Allergies    Antimicrobials: 5/27 Vancomycin >> 5/27 Zosyn >>  Dose Adjustments: 5/28: CrCl improved to ~110 ml/min >> vancomycin to 1000mg  IV q8h per nomogram  Microbiology: 5/27  BCx: NGTD 5/27 UCx: IP  Thank you for allowing pharmacy to be a part of this patient's care.  Arrie Senate, PharmD PGY-1 Pharmacy Resident Pager: [phone] 05/20/2017

## 2017-05-20 NOTE — Progress Notes (Signed)
Blood Glucose levels from 8 PM to 2 AM are respectively, 198, 158, 146, 156, 132 and 143 at 2 AM- Mg 1.6, Phos 3.1, troponin 0.11. Medical team updated-  No sliding scale ordered- Magnesium IV 4 gm to be initiated now as per order-

## 2017-05-20 NOTE — Discharge Summary (Signed)
Physician Discharge Summary  Erika [name] GBT:[id number] DOB: 05-05-1971 DOA: 05/19/2017  PCP: Lucille Passy, MD  Admit date: 05/19/2017 Discharge date: 05/20/2017  Time spent: 40 minutes  Recommendations for Outpatient Follow-up:  1. Needs insulin education set-up as OP 2. recommending holding metformin for now-use 70/30 insulin 5 U bid and re-assess with A1c as Op 3. Stopped Aldactone this admit, stopped metformin this admit 4. Needs bmet 1 week post-d/c at pcp office  Discharge Diagnoses:  Active Problems:   Elevated troponin   Tobacco abuse   CAD in native artery   Hypertension, accelerated with heart disease, without CHF   Sepsis ([place])   Sepsis due to undetermined organism ([place])   Hyperosmolar non-ketotic state in patient with type 2 diabetes mellitus [facility])   Discharge Condition: improved  Diet recommendation: diabetic hh  Filed Weights   05/19/17 1010 05/20/17 0419  Weight: 101.6 kg (224 lb) 101.6 kg (223 lb 14.4 oz)    History of present illness:   45 ? CAD/MI 06/29/16  CABG x 4 06/2016 cea 06/2016 COPD Tob abuse Hld Bipolar, PTSD H/o poorly controlled DM ty II smoker  Admit from home with hypotension, Jaw pain and neck pain but no Chest pain WBC 15 on admit-no fever Empirically covered with Vanc amd Zosyn Troponin 0.10---trend was flat and had no further pain-I discussed with the patient whether there were any symptoms similar to her prior HA and she was emphatic about this not being the case Her DKA resolved very quickly and she was transitioned to diet We had a long discussion about insulin, and given ease of use, and her familiarity with use of 70/30 insulin, I elected to start her on 70/30 insulin 5 u BID which can be uptitrated as an OP We will hold her Aldactone for now pending OP repeat labs and d/c her metformin which could have contributed to her initial lactic acidosis   Discharge Exam: Vitals:   05/20/17 1135 05/20/17 1200  BP: (!) 160/87    Pulse: 73 78  Resp: (!) 24 (!) 21  Temp: 97.9 F (36.6 C)    eomi obese ncat cta b abd soft nt nd no rebound no guard No le edema Tattoo L arm Hirsute  Discharge Instructions   Discharge Instructions    Diet - low sodium heart healthy    Complete by:  As directed    Increase activity slowly    Complete by:  As directed      Current Discharge Medication List    START taking these medications   Details  insulin NPH-regular Human (NOVOLIN 70/30) (70-30) 100 UNIT/ML injection Take 5 UNITs twice a day and follow with Primary MD Qty: 10 mL, Refills: 3    INSULIN SYRINGE .5CC/29G 29G X 1/2" 0.5 ML MISC 1 Syringe by Does not apply route 2 (two) times daily. Qty: 60 each, Refills: 1    Insulin Syringe-Needle U-100 29G X 1/2" 0.3 ML MISC 1 each by Does not apply route 2 (two) times daily. Qty: 30 each, Refills: 0      CONTINUE these medications which have NOT CHANGED   Details  aspirin EC 325 MG EC tablet Take 1 tablet (325 mg total) by mouth daily. Qty: 30 tablet, Refills: 0    atorvastatin (LIPITOR) 80 MG tablet Take 1 tablet (80 mg total) by mouth daily at 6 PM. Qty: 90 tablet, Refills: 2    Azilsartan-Chlorthalidone (EDARBYCLOR) 40-25 MG TABS Take 1 tablet by mouth daily. Qty: 90  tablet, Refills: 3    carvedilol (COREG) 25 MG tablet Take 1 tablet (25 mg total) by mouth 2 (two) times daily. Qty: 60 tablet, Refills: 6    chlorproMAZINE (THORAZINE) 10 MG tablet Take 1-2 tab at bed time Qty: 60 tablet, Refills: 1   Associated Diagnoses: PTSD (post-traumatic stress disorder)    clonazePAM (KLONOPIN) 0.5 MG tablet Take 1 tablet (0.5 mg total) by mouth 3 (three) times daily as needed for anxiety. Qty: 90 tablet, Refills: 1   Associated Diagnoses: Major depressive disorder, recurrent episode, moderate ([facility])    FLUoxetine HCl 60 MG TABS Take 60 mg by mouth daily. Qty: 30 tablet, Refills: 1   Associated Diagnoses: Major depressive disorder, recurrent episode, moderate  ([facility])    HYDROcodone-acetaminophen (NORCO) 7.5-325 MG tablet Take 1 tablet by mouth every 6 (six) hours as needed for moderate pain. Qty: 60 tablet, Refills: 0    isosorbide mononitrate (IMDUR) 30 MG 24 hr tablet Take 1 tablet (30 mg total) by mouth daily. Qty: 30 tablet, Refills: 6    lamoTRIgine (LAMICTAL) 200 MG tablet Take 1 tablet (200 mg total) by mouth daily. Qty: 30 tablet, Refills: 1   Associated Diagnoses: Major depressive disorder, recurrent episode, moderate ([facility])    metFORMIN (GLUCOPHAGE) 500 MG tablet TAKE ONE TABLET BY MOUTH ONCE DAILY WITH BREAKFAST Qty: 30 tablet, Refills: 3    nitroGLYCERIN (NITROSTAT) 0.4 MG SL tablet Place 1 tablet (0.4 mg total) under the tongue every 5 (five) minutes as needed for chest pain. Qty: 30 tablet, Refills: 0      STOP taking these medications     spironolactone (ALDACTONE) 25 MG tablet        Allergies  Allergen Reactions  . No Known Allergies       The results of significant diagnostics from this hospitalization (including imaging, microbiology, ancillary and laboratory) are listed below for reference.    Significant Diagnostic Studies: Dg Chest 2 View  Result Date: 05/19/2017 CLINICAL DATA:  Neck pain, throat pain, jaw pain, chest pressure EXAM: CHEST  2 VIEW COMPARISON:  08/28/2016 FINDINGS: There is no focal parenchymal opacity. There is no pleural effusion or pneumothorax. The heart and mediastinal contours are unremarkable. There is evidence of prior CABG. The osseous structures are unremarkable. IMPRESSION: No active cardiopulmonary disease. Electronically Signed   By: Kathreen Devoid   On: 05/19/2017 11:01    Microbiology: Recent Results (from the past 240 hour(s))  Blood Culture (routine x 2)     Status: None (Preliminary result)   Collection Time: 05/19/17 11:23 AM  Result Value Ref Range Status   Specimen Description BLOOD RIGHT HAND  Final   Special Requests   Final    BOTTLES DRAWN AEROBIC AND ANAEROBIC Blood  Culture adequate volume   Culture NO GROWTH < 24 HOURS  Final   Report Status PENDING  Incomplete  Blood Culture (routine x 2)     Status: None (Preliminary result)   Collection Time: 05/19/17 11:29 AM  Result Value Ref Range Status   Specimen Description RIGHT ANTECUBITAL  Final   Special Requests   Final    BOTTLES DRAWN AEROBIC AND ANAEROBIC Blood Culture adequate volume   Culture NO GROWTH < 24 HOURS  Final   Report Status PENDING  Incomplete     Labs: Basic Metabolic Panel:  Recent Labs Lab 05/19/17 1026 05/19/17 1858 05/19/17 2310 05/19/17 2313 05/20/17 0502  NA 128* 130*  --  132* 134*  K 3.3* 2.7*  --  3.0* 3.3*  CL 85* 92*  --  95* 97*  CO2 28 30  --  29 28  GLUCOSE 503* 231*  --  150* 191*  BUN 14 9  --  10 9  CREATININE 1.36* 0.91  --  0.82 0.79  CALCIUM 10.1 9.6  --  9.7 9.3  MG  --   --  1.6*  --   --   PHOS  --   --  3.1  --   --    Liver Function Tests:  Recent Labs Lab 05/19/17 1026 05/20/17 0502  AST 48* 34  ALT 42 32  ALKPHOS 69 54  BILITOT 0.6 0.7  PROT 7.2 5.9*  ALBUMIN 3.5 2.8*   No results for input(s): LIPASE, AMYLASE in the last 168 hours. No results for input(s): AMMONIA in the last 168 hours. CBC:  Recent Labs Lab 05/19/17 1026 05/20/17 0502  WBC 15.9* 13.8*  HGB 15.5* 13.1  HCT 44.4 39.7  MCV 87.2 89.4  PLT 389 335   Cardiac Enzymes:  Recent Labs Lab 05/19/17 1629 05/19/17 2310  TROPONINI 0.09* 0.11*   BNP: BNP (last 3 results)  Recent Labs  06/30/16 0345 05/19/17 1026  BNP 120.7* 28.0    ProBNP (last 3 results) No results for input(s): PROBNP in the last 8760 hours.  CBG:  Recent Labs Lab 05/20/17 0815 05/20/17 0923 05/20/17 1022 05/20/17 1133 05/20/17 1230  GLUCAP 192* 170* 153* 123* 214*       Signed:  Nita Sells MD   [facility] 05/20/2017, 2:10 PM

## 2017-05-20 NOTE — Progress Notes (Signed)
Pt demonstrates correct procedure for drawing up insulin and giving to herself.  Pt refused meal coverage at this time.  "Will eat after I am discharged and will administer that portion".

## 2017-05-20 NOTE — Progress Notes (Signed)
Dc instructions given to pt at this time.  Pt verbalized understanding of all instructions.  F/u with pcp tomorrow.  No s/s of any acute distress at time of dc.  Pt left with husband.

## 2017-05-20 NOTE — Progress Notes (Signed)
Magnesium 4 gm infusion completed-Normal saline with 40 mEq is infusing at 125 ml/hr- pt has received 40 mEq of KCL by mouth and 10 mEq intravenously - the second dose of 10 mEq IV was refused by pt.  Last K level 3.0.

## 2017-05-21 ENCOUNTER — Telehealth: Payer: Self-pay | Admitting: Radiology

## 2017-05-21 ENCOUNTER — Encounter: Payer: Self-pay | Admitting: Family Medicine

## 2017-05-21 ENCOUNTER — Telehealth: Payer: Self-pay | Admitting: Cardiovascular Disease

## 2017-05-21 ENCOUNTER — Ambulatory Visit (INDEPENDENT_AMBULATORY_CARE_PROVIDER_SITE_OTHER): Payer: BLUE CROSS/BLUE SHIELD | Admitting: Family Medicine

## 2017-05-21 VITALS — BP 178/100 | HR 78 | Wt 222.8 lb

## 2017-05-21 DIAGNOSIS — R778 Other specified abnormalities of plasma proteins: Secondary | ICD-10-CM | POA: Insufficient documentation

## 2017-05-21 DIAGNOSIS — R748 Abnormal levels of other serum enzymes: Secondary | ICD-10-CM | POA: Diagnosis not present

## 2017-05-21 DIAGNOSIS — I10 Essential (primary) hypertension: Secondary | ICD-10-CM

## 2017-05-21 DIAGNOSIS — R7989 Other specified abnormal findings of blood chemistry: Secondary | ICD-10-CM

## 2017-05-21 DIAGNOSIS — E1100 Type 2 diabetes mellitus with hyperosmolarity without nonketotic hyperglycemic-hyperosmolar coma (NKHHC): Secondary | ICD-10-CM

## 2017-05-21 DIAGNOSIS — E1169 Type 2 diabetes mellitus with other specified complication: Secondary | ICD-10-CM

## 2017-05-21 DIAGNOSIS — R42 Dizziness and giddiness: Secondary | ICD-10-CM | POA: Diagnosis not present

## 2017-05-21 DIAGNOSIS — E1101 Type 2 diabetes mellitus with hyperosmolarity with coma: Secondary | ICD-10-CM | POA: Diagnosis not present

## 2017-05-21 LAB — COMPREHENSIVE METABOLIC PANEL
ALBUMIN: 4 g/dL (ref 3.5–5.2)
ALT: 43 U/L — ABNORMAL HIGH (ref 0–35)
AST: 51 U/L — AB (ref 0–37)
Alkaline Phosphatase: 61 U/L (ref 39–117)
BUN: 12 mg/dL (ref 6–23)
CHLORIDE: 95 meq/L — AB (ref 96–112)
CO2: 31 meq/L (ref 19–32)
CREATININE: 0.8 mg/dL (ref 0.40–1.20)
Calcium: 10.2 mg/dL (ref 8.4–10.5)
GFR: 83.02 mL/min (ref >60.00)
GLUCOSE: 226 mg/dL — AB (ref 70–99)
POTASSIUM: 3.4 meq/L — AB (ref 3.5–5.1)
SODIUM: 134 meq/L — AB (ref 135–145)
Total Bilirubin: 0.2 mg/dL (ref 0.2–1.2)
Total Protein: 7.4 g/dL (ref 6.0–8.3)

## 2017-05-21 LAB — URINE CULTURE: CULTURE: NO GROWTH

## 2017-05-21 LAB — HEMOGLOBIN A1C
HEMOGLOBIN A1C: 11 % — AB (ref 4.8–5.6)
Mean Plasma Glucose: 269 mg/dL

## 2017-05-21 LAB — TROPONIN I: TNIDX: 0.09 ug/l — ABNORMAL HIGH (ref 0.00–0.06)

## 2017-05-21 MED ORDER — HYDROCODONE-ACETAMINOPHEN 7.5-325 MG PO TABS: 1.0000 | ORAL_TABLET | Freq: Four times a day (QID) | ORAL | 0 refills | Status: DC | PRN

## 2017-05-21 MED ORDER — INSULIN GLARGINE 100 UNIT/ML ~~LOC~~ SOLN: 10.0000 [IU] | Freq: Every day | SUBCUTANEOUS | 11 refills | Status: DC

## 2017-05-21 NOTE — Telephone Encounter (Signed)
Noted.  This value is actually lower than it was at discharge.  It is trending down.

## 2017-05-21 NOTE — Telephone Encounter (Signed)
S/w pt who was admitted to [facility] for hypotension.  Discharged 5/28.  Troponin was elevated during hospital admission. She was advised to f/u w/Dr. Fletcher Anon.  Pt agreeable to May 31, 3:40pm. Reviewed s/s that would need immediate attention in the ER. Pt verbalized understanding.

## 2017-05-21 NOTE — Assessment & Plan Note (Signed)
Deteriorated in a very complex pt. Was in DKA which has now resolved. Will check labs today.  Advised starting Lantus rather than short acting insulin. Metformin d/c'd  eRx sent for Lantus 10 units nightly.  She will check FSBS 3 times daily and send me daily readings.  Will likely increase by 2 units every few days.  The patient indicates understanding of these issues and agrees with the plan.

## 2017-05-21 NOTE — Patient Instructions (Signed)
Great to see you.  We are starting Lantus 10 units.  Please call Dr. Fletcher Anon ASAP.

## 2017-05-21 NOTE — Telephone Encounter (Signed)
[facility] lab called a critical result, Troponin 0.09, anything greater that 0.06 the lab is required to call. Result called to Dr Deborra Medina

## 2017-05-21 NOTE — Assessment & Plan Note (Signed)
Deteriorated in the setting of recent profound and symptomatic hypotensive. Continue to hold aldactone, continue other rxs (has not yet taken today). Make appt with Dr. Fletcher Anon ASAP for this and elevated troponin. The patient indicates understanding of these issues and agrees with the plan.

## 2017-05-21 NOTE — Progress Notes (Signed)
Subjective:   Patient ID: Erika [name], female    DOB: 20-Jul-1971, 46 y.o.   MRN: [id number]  Erika [name] is a pleasant 46 y.o. year old female who presents to clinic today with Blood Pressure Check and Headache  on 05/21/2017  HPI:  Admitted to hospital two days ago- 05/19/17 when she presented to the ER with jaw pain and dizziness. She was hypotensive in the ER- BP of 72/52. Given 3 L of NS and BP improved.  Notes reviewed. a1c deteriorated from 6.2 8 months ago to 11.0 on admission two days ago. Was in DKA on admission- resolved quickly and transitioned to oral diet. Started on Insulin ( short acting) bus she has not yet started this at home.  Aldactone held and metformin d/c'd given lactic acidosis on admission.  WBC on admission was 15- no fever, empirically treated with Vanc and Zosyn for sepsis.Thsi was d/c'd  cx remain neg.  Troponin was 0.1  Dg Chest 2 View  Result Date: 05/19/2017 CLINICAL DATA:  Neck pain, throat pain, jaw pain, chest pressure EXAM: CHEST  2 VIEW COMPARISON:  08/28/2016 FINDINGS: There is no focal parenchymal opacity. There is no pleural effusion or pneumothorax. The heart and mediastinal contours are unremarkable. There is evidence of prior CABG. The osseous structures are unremarkable. IMPRESSION: No active cardiopulmonary disease. Electronically Signed   By: Kathreen Devoid   On: 05/19/2017 11:01   Lab Results  Component Value Date   CREATININE 0.79 05/20/2017   Lab Results  Component Value Date   NA 134 (L) 05/20/2017   K 3.3 (L) 05/20/2017   CL 97 (L) 05/20/2017   CO2 28 05/20/2017   Lab Results  Component Value Date   HGBA1C 11.0 (H) 05/19/2017    Current Outpatient Prescriptions on File Prior to Visit  Medication Sig Dispense Refill  . aspirin EC 325 MG EC tablet Take 1 tablet (325 mg total) by mouth daily. 30 tablet 0  . atorvastatin (LIPITOR) 80 MG tablet Take 1 tablet (80 mg total) by mouth daily at 6 PM. 90 tablet 2  .  Azilsartan-Chlorthalidone (EDARBYCLOR) 40-25 MG TABS Take 1 tablet by mouth daily. 90 tablet 3  . carvedilol (COREG) 25 MG tablet Take 1 tablet (25 mg total) by mouth 2 (two) times daily. 60 tablet 6  . chlorproMAZINE (THORAZINE) 10 MG tablet Take 1-2 tab at bed time 60 tablet 1  . clonazePAM (KLONOPIN) 0.5 MG tablet Take 1 tablet (0.5 mg total) by mouth 3 (three) times daily as needed for anxiety. 90 tablet 1  . FLUoxetine HCl 60 MG TABS Take 60 mg by mouth daily. 30 tablet 1  . HYDROcodone-acetaminophen (NORCO) 7.5-325 MG tablet Take 1 tablet by mouth every 6 (six) hours as needed for moderate pain. 60 tablet 0  . insulin NPH-regular Human (NOVOLIN 70/30) (70-30) 100 UNIT/ML injection Take 5 UNITs twice a day and follow with Primary MD 10 mL 3  . INSULIN SYRINGE .5CC/29G 29G X 1/2" 0.5 ML MISC 1 Syringe by Does not apply route 2 (two) times daily. 60 each 1  . Insulin Syringe-Needle U-100 29G X 1/2" 0.3 ML MISC 1 each by Does not apply route 2 (two) times daily. 30 each 0  . isosorbide mononitrate (IMDUR) 30 MG 24 hr tablet Take 1 tablet (30 mg total) by mouth daily. 30 tablet 6  . lamoTRIgine (LAMICTAL) 200 MG tablet Take 1 tablet (200 mg total) by mouth daily. 30 tablet 1  . metFORMIN (  GLUCOPHAGE) 500 MG tablet TAKE ONE TABLET BY MOUTH ONCE DAILY WITH BREAKFAST 30 tablet 3  . nitroGLYCERIN (NITROSTAT) 0.4 MG SL tablet Place 1 tablet (0.4 mg total) under the tongue every 5 (five) minutes as needed for chest pain. 30 tablet 0   No current facility-administered medications on file prior to visit.     Allergies  Allergen Reactions  . No Known Allergies     Past Medical History:  Diagnosis Date  . Arthralgia of temporomandibular joint   . CAD, multiple vessel    a. cath 06/29/16: ostLM 40%, ostLAD 40%, pLAD 95%, ost-pLCx 60%, pLCx 95%, mLCx 60%, mRCA 95%, D2 50%, LVSF nl;  b. 07/2016 CABG x 4 (LIMA->LAD, VG->Diag, VG->OM, VG->RCA).  . Carotid arterial disease ([place])    a. 07/2016 s/p R CEA.  .  Depression   . Diastolic dysfunction    a.e cho 06/28/16: EF 50-55%, mild inf wall HK, GR1DD, mild MR, RV sys fxn nl, mildly dilated LA, PASP nl  . Fatty liver disease, nonalcoholic 2440  . HLD (hyperlipidemia)   . Malignant hypertension   . Obesity   . PTSD (post-traumatic stress disorder)   . Tobacco abuse   . Type 2 diabetes mellitus [facility]) January 2017    Past Surgical History:  Procedure Laterality Date  . CARDIAC CATHETERIZATION [name] 06/29/2016   Procedure: Left Heart Cath and Coronary Angiography;  Surgeon: Minna Merritts, MD;  Location: [place] [facility];  Service: Cardiovascular;  Laterality: [name];  . CARDIAC CATHETERIZATION [name] 08/29/2016   Procedure: Left Heart Cath and Cors/Grafts Angiography;  Surgeon: Wellington Hampshire, MD;  Location: [place] [facility];  Service: Cardiovascular;  Laterality: [name];  . CESAREAN SECTION    . CHOLECYSTECTOMY    . CORONARY ARTERY BYPASS GRAFT [name] 07/06/2016   Procedure: CORONARY ARTERY BYPASS GRAFTING (CABG) x four, using left internal mammary artery and right leg greater saphenous vein harvested endoscopically;  Surgeon: Ivin Poot, MD;  Location: [place];  Service: Open Heart Surgery;  Laterality: [name];  . ENDARTERECTOMY Right 07/06/2016   Procedure: ENDARTERECTOMY CAROTID;  Surgeon: Rosetta Posner, MD;  Location: [place];  Service: Vascular;  Laterality: Right;  . PERIPHERAL VASCULAR CATHETERIZATION [name] 04/18/2016   Procedure: Renal Angiography;  Surgeon: Wellington Hampshire, MD;  Location: [place] [facility];  Service: Cardiovascular;  Laterality: [name];  . TEE WITHOUT CARDIOVERSION [name] 07/06/2016   Procedure: TRANSESOPHAGEAL ECHOCARDIOGRAM (TEE);  Surgeon: Ivin Poot, MD;  Location: [place];  Service: Open Heart Surgery;  Laterality: [name];  . TONSILLECTOMY      Family History  Problem Relation Age of Onset  . Adopted: Yes  . Diabetes Mother   . Diabetes Father   . Alcohol abuse Father   . Heart disease Father   . Drug abuse Father   . Stroke  Sister   . Anxiety disorder Sister     Social History   Social History  . Marital status: Married    Spouse name: [name]  . Number of children: [name]  . Years of education: [name]   Occupational History  . Not on file.   Social History Main Topics  . Smoking status: Current Every Day Smoker    Packs/day: 0.50    Types: Cigarettes    Last attempt to quit: 06/28/2016  . Smokeless tobacco: Never Used  . Alcohol use No  . Drug use: No  . Sexual activity: Yes    Birth control/ protection: None  Other Topics Concern  . Not on file   Social History Narrative  . No narrative on file   The PMH, PSH, Social History, Family History, Medications, and allergies have been reviewed in [facility], and have been updated if relevant.    Review of Systems  Constitutional: Negative.   Eyes: Negative.   Respiratory: Negative.   Endocrine: Negative.   Genitourinary: Negative.   Neurological: Positive for dizziness and headaches. Negative for tremors, seizures, syncope, facial asymmetry, speech difficulty, weakness, light-headedness and numbness.  All other systems reviewed and are negative.      Objective:    BP (!) 178/100   Pulse 78   Wt 222 lb 12 oz (101 kg)   SpO2 98%   BMI 34.89 kg/m    Physical Exam  Constitutional: She is oriented to person, place, and time. She appears well-developed and well-nourished. No distress.  HENT:  Head: Normocephalic and atraumatic.  Cardiovascular: Normal rate.   Pulmonary/Chest: Effort normal.  Neurological: She is alert and oriented to person, place, and time. No cranial nerve deficit.  Skin: Skin is warm and dry. She is not diaphoretic.  Psychiatric: She has a normal mood and affect. Her behavior is normal. Judgment and thought content normal.  Nursing note and vitals reviewed.         Assessment & Plan:   Hyperosmolar non-ketotic state in patient with type 2 diabetes mellitus ([place])  Dizziness No Follow-up on file.

## 2017-05-21 NOTE — Progress Notes (Signed)
Pre visit review using our clinic review tool, if applicable. No additional management support is needed unless otherwise documented below in the visit note. 

## 2017-05-21 NOTE — Telephone Encounter (Signed)
Pt states her PCP advised her to contact us due to her Troponin levels. Denies CP, states she was admitted this past weekend for her BP dropped low. Please call.

## 2017-05-21 NOTE — Telephone Encounter (Signed)
Attempted to contact pt. No answer, no VM on home phone. Cell number busy. Will call again.

## 2017-05-22 ENCOUNTER — Encounter: Payer: Self-pay | Admitting: Family Medicine

## 2017-05-23 ENCOUNTER — Ambulatory Visit (INDEPENDENT_AMBULATORY_CARE_PROVIDER_SITE_OTHER): Payer: BLUE CROSS/BLUE SHIELD | Admitting: Cardiovascular Disease

## 2017-05-23 ENCOUNTER — Telehealth: Payer: Self-pay

## 2017-05-23 ENCOUNTER — Encounter: Payer: Self-pay | Admitting: Cardiovascular Disease

## 2017-05-23 VITALS — BP 122/80 | HR 100 | Ht 67.0 in | Wt 220.2 lb

## 2017-05-23 DIAGNOSIS — I739 Peripheral vascular disease, unspecified: Secondary | ICD-10-CM

## 2017-05-23 DIAGNOSIS — I779 Disorder of arteries and arterioles, unspecified: Secondary | ICD-10-CM | POA: Diagnosis not present

## 2017-05-23 DIAGNOSIS — I119 Hypertensive heart disease without heart failure: Secondary | ICD-10-CM

## 2017-05-23 DIAGNOSIS — Z720 Tobacco use: Secondary | ICD-10-CM

## 2017-05-23 DIAGNOSIS — I25119 Atherosclerotic heart disease of native coronary artery with unspecified angina pectoris: Secondary | ICD-10-CM

## 2017-05-23 MED ORDER — CARVEDILOL 6.25 MG PO TABS: 12.5000 mg | ORAL_TABLET | Freq: Two times a day (BID) | ORAL | 6 refills | Status: DC

## 2017-05-23 NOTE — Patient Instructions (Addendum)
Medication Instructions:  Please STOP Edarbyclor Please decrease carvedilol to 12.5 mg twice daily  Labwork: None  Testing/Procedures: Please monitor your BP once daily and call next week with readings  Follow-Up: Your physician recommends that you schedule a follow-up appointment in: 1 month  If you need a refill on your cardiac medications before your next appointment, please call your pharmacy.  Blood Pressure Record Sheet Your blood pressure on this visit to the emergency department or clinic is elevated. This does not necessarily mean you have high blood pressure (hypertension), but it does mean that your blood pressure needs to be rechecked. Many times your blood pressure can increase due to illness, pain, anxiety, or other factors. We recommend that you get a series of blood pressure readings done over a period of 5 days. It is best to get a reading in the morning and one in the evening. You should make sure to sit and relax for 1-5 minutes before the reading is taken. Write the readings down and make a follow-up appointment with your health care provider to discuss the results. If there is not a free clinic or a drug store with a blood-pressure-taking machine near you, you can purchase blood-pressure-taking equipment from a drug store. Having one in the home allows you the convenience of taking your blood pressure while you are home and relaxed. Blood Pressure Log Date: _______________________  a.m. _____________________  p.m. _____________________  Date: _______________________  a.m. _____________________  p.m. _____________________  Date: _______________________  a.m. _____________________  p.m. _____________________  Date: _______________________  a.m. _____________________  p.m. _____________________  Date: _______________________  a.m. _____________________  p.m. _____________________  This information is not intended to replace advice given to you by  your health care provider. Make sure you discuss any questions you have with your health care provider. Document Released: 09/08/2003 Document Revised: 11/23/2016 Document Reviewed: 02/02/2014 Elsevier Interactive Patient Education  2018 [employer].

## 2017-05-23 NOTE — Progress Notes (Signed)
Cardiology Office Note   Date:  05/23/2017   ID:  Erika [name], DOB May 15, 1971, MRN [id number]  PCP:  Lucille Passy, MD Cardiologist:   [name], MD   Chief Complaint  Patient presents with  . other    Hospital follow up. elevated Troponin Patient c/o Jaw weakness. Meds reviewed verbally with patient.       History of Present Illness: Erika [name] is a 46 y.o. female who presents for a follow-up visit regarding Coronary artery disease .[name] with no evidence of secondary hypertension. She presented in July, 2017 with non-ST elevation myocardial infarction. She underwent cardiac catheterization which showed severe three-vessel coronary artery disease.  Preop carotid Doppler showed 80-99% right ICA stenosis. She underwent CABG and right carotid endarterectomy at the same time in July of 2017.  She was rehospitalized in September with chest pain in the setting of uncontrolled hypertension. Cardiac catheterization showed patent grafts . The LAD had diffuse disease distal to the anastomosis and was very small in caliber. Ejection fraction was normal with mildly elevated left ventricular end-diastolic pressure.   She was hospitalized recently due to hypotension. Systolic blood pressure was in the 70s. She reported exertional chest and jaw pain. She was given 3 L of normal saline with improvement. Troponin was 0.1. Glucose was 503. ECG showed sinus rhythm with diffuse ST depression. Lactic acid was 5. She has not required any antihypertensive medications since hospital discharge other than Imdur. She reports no chest discomfort. She describes jaw weakness which is different from her prior angina. She has no explanation of why her glycemic control worsened with significant increase and hemoglobin A1c from 6 to11. Unfortunately, she continues to smoke.  Past Medical History:  Diagnosis Date  . Arthralgia of temporomandibular joint     . CAD, multiple vessel    a. cath 06/29/16: ostLM 40%, ostLAD 40%, pLAD 95%, ost-pLCx 60%, pLCx 95%, mLCx 60%, mRCA 95%, D2 50%, LVSF nl;  b. 07/2016 CABG x 4 (LIMA->LAD, VG->Diag, VG->OM, VG->RCA).  . Carotid arterial disease ([place])    a. 07/2016 s/p R CEA.  . Depression   . Diastolic dysfunction    a.e cho 06/28/16: EF 50-55%, mild inf wall HK, GR1DD, mild MR, RV sys fxn nl, mildly dilated LA, PASP nl  . Fatty liver disease, nonalcoholic 9381  . HLD (hyperlipidemia)   . Malignant hypertension   . Obesity   . PTSD (post-traumatic stress disorder)   . Tobacco abuse   . Type 2 diabetes mellitus [facility]) January 2017    Past Surgical History:  Procedure Laterality Date  . CARDIAC CATHETERIZATION [name] 06/29/2016   Procedure: Left Heart Cath and Coronary Angiography;  Surgeon: Minna Merritts, MD;  Location: [place] [facility];  Service: Cardiovascular;  Laterality: [name];  . CARDIAC CATHETERIZATION [name] 08/29/2016   Procedure: Left Heart Cath and Cors/Grafts Angiography;  Surgeon: Wellington Hampshire, MD;  Location: [place] [facility];  Service: Cardiovascular;  Laterality: [name];  . CESAREAN SECTION    . CHOLECYSTECTOMY    . CORONARY ARTERY BYPASS GRAFT [name] 07/06/2016   Procedure: CORONARY ARTERY BYPASS GRAFTING (CABG) x four, using left internal mammary artery and right leg greater saphenous vein harvested endoscopically;  Surgeon: Ivin Poot, MD;  Location: [place];  Service: Open Heart Surgery;  Laterality: [name];  . ENDARTERECTOMY Right 07/06/2016   Procedure: ENDARTERECTOMY CAROTID;  Surgeon: Rosetta Posner, MD;  Location: [place];  Service:  Vascular;  Laterality: Right;  . PERIPHERAL VASCULAR CATHETERIZATION [name] 04/18/2016   Procedure: Renal Angiography;  Surgeon: Wellington Hampshire, MD;  Location: [place] [facility];  Service: Cardiovascular;  Laterality: [name];  . TEE WITHOUT CARDIOVERSION [name] 07/06/2016   Procedure: TRANSESOPHAGEAL ECHOCARDIOGRAM (TEE);  Surgeon: Ivin Poot, MD;  Location: [place];   Service: Open Heart Surgery;  Laterality: [name];  . TONSILLECTOMY       Current Outpatient Prescriptions  Medication Sig Dispense Refill  . aspirin EC 325 MG EC tablet Take 1 tablet (325 mg total) by mouth daily. 30 tablet 0  . atorvastatin (LIPITOR) 80 MG tablet Take 1 tablet (80 mg total) by mouth daily at 6 PM. 90 tablet 2  . carvedilol (COREG) 6.25 MG tablet Take 2 tablets (12.5 mg total) by mouth 2 (two) times daily. 60 tablet 6  . chlorproMAZINE (THORAZINE) 10 MG tablet Take 1-2 tab at bed time 60 tablet 1  . clonazePAM (KLONOPIN) 0.5 MG tablet Take 1 tablet (0.5 mg total) by mouth 3 (three) times daily as needed for anxiety. 90 tablet 1  . FLUoxetine HCl 60 MG TABS Take 60 mg by mouth daily. 30 tablet 1  . HYDROcodone-acetaminophen (NORCO) 7.5-325 MG tablet Take 1 tablet by mouth every 6 (six) hours as needed for moderate pain. 60 tablet 0  . insulin glargine (LANTUS) 100 UNIT/ML injection Inject 0.1 mLs (10 Units total) into the skin at bedtime. 10 mL 11  . insulin NPH-regular Human (NOVOLIN 70/30) (70-30) 100 UNIT/ML injection Take 5 UNITs twice a day and follow with Primary MD 10 mL 3  . INSULIN SYRINGE .5CC/29G 29G X 1/2" 0.5 ML MISC 1 Syringe by Does not apply route 2 (two) times daily. 60 each 1  . Insulin Syringe-Needle U-100 29G X 1/2" 0.3 ML MISC 1 each by Does not apply route 2 (two) times daily. 30 each 0  . isosorbide mononitrate (IMDUR) 30 MG 24 hr tablet Take 1 tablet (30 mg total) by mouth daily. 30 tablet 6  . lamoTRIgine (LAMICTAL) 200 MG tablet Take 1 tablet (200 mg total) by mouth daily. 30 tablet 1  . nitroGLYCERIN (NITROSTAT) 0.4 MG SL tablet Place 1 tablet (0.4 mg total) under the tongue every 5 (five) minutes as needed for chest pain. 30 tablet 0   No current facility-administered medications for this visit.     Allergies:   No known allergies    Social History:  The patient  reports that she has been smoking Cigarettes.  She has been smoking about 0.50 packs  per day. She has never used smokeless tobacco. She reports that she does not drink alcohol or use drugs.   Family History:  The patient's family history includes Alcohol abuse in her father; Anxiety disorder in her sister; Diabetes in her father and mother; Drug abuse in her father; Heart disease in her father; Stroke in her sister. She was adopted.    ROS:  Please see the history of present illness.   Otherwise, review of systems are positive for none.   All other systems are reviewed and negative.    PHYSICAL EXAM: VS:  BP 122/80 (BP Location: Left Arm, Patient Position: Sitting, Cuff Size: Normal)   Pulse 100   Ht 5\' 7"  (1.702 m)   Wt 220 lb 4 oz (99.9 kg)   BMI 34.50 kg/m  , BMI Body mass index is 34.5 kg/m. GEN: Well nourished, well developed, in no acute distress  HEENT: normal  Neck:  no JVD, carotid bruits, or masses Cardiac: RRR; no  rubs, or gallops,no edema . There is 1/6 systolic flow murmur in the pulmonic area Respiratory:  clear to auscultation bilaterally, normal work of breathing GI: soft, nontender, nondistended, + BS MS: no deformity or atrophy  Skin: warm and dry, no rash Neuro:  Strength and sensation are intact Psych: euthymic mood, full affect    EKG:  EKG is  ordered today. EKG showed normal sinus rhythm with diffuse ST depressions suggestive of ischemia   Recent Labs: 06/30/2016: TSH 1.485 05/19/2017: B Natriuretic Peptide 28.0; Magnesium 1.6 05/20/2017: Hemoglobin 13.1; Platelets 335 05/21/2017: ALT 43; BUN 12; Creatinine, Ser 0.80; Potassium 3.4; Sodium 134    Lipid Panel    Component Value Date/Time   CHOL 121 08/21/2016 0817   TRIG 164 (H) 08/21/2016 0817   HDL 24 (L) 08/21/2016 0817   CHOLHDL 5.0 (H) 08/21/2016 0817   CHOLHDL 9.5 06/28/2016 0544   VLDL UNABLE TO CALCULATE IF TRIGLYCERIDE OVER 400 mg/dL 06/28/2016 0544   LDLCALC 64 08/21/2016 0817   LDLDIRECT 177.1 04/22/2012 0809      Wt Readings from Last 3 Encounters:  05/23/17 220 lb 4  oz (99.9 kg)  05/21/17 222 lb 12 oz (101 kg)  05/20/17 223 lb 14.4 oz (101.6 kg)         ASSESSMENT AND PLAN:  1.  Coronary artery disease involving native coronary arteries with stable angina: Status post CABG in July.  Recently elevated troponin was likely due to demand ischemia in the setting of severe hypertension. Currently she has no anginal symptoms and thus I recommend continuing medical therapy.  2. Carotid artery disease status post right carotid endarterectomy. Continue aggressive treatment of risk factors.She follows with [facility].  3. Essential hypertension:  I have no good explanation of why her blood pressure fluctuates significantly. I did stress to her the importance of taking medications in a consistent manner. Blood pressure has been on the low side even without medications other than Imdur. I asked her to resume carvedilol at the lower dose of 12.5 mg twice daily. I discontinued Edarbyclor for now and she has been off spironolactone. I asked her to monitor blood pressure daily over the next week and notify us with readings.  4. Hyperlipidemia: Continue high dose atorvastatin.   5. Tobacco use: I again discussed with her the importance of complete smoking cessation. She is not ready to quit.  Disposition:   FU with me in 1 month  Signed,  [name], MD  05/23/2017 4:00 PM    [place]

## 2017-05-23 NOTE — Telephone Encounter (Signed)
Cathy case mgr with [employer] left v/m wanting Dr Deborra Medina to know that pt had been admitted to hospital on 05/19/17 and discharged on 05/20/17 home with dx sepsis unspecified; pt did enroll in case mgt wit [employer] for education and support. Call back not necessary.

## 2017-05-24 ENCOUNTER — Encounter: Payer: Self-pay | Admitting: Family Medicine

## 2017-05-24 LAB — CULTURE, BLOOD (ROUTINE X 2)
CULTURE: NO GROWTH
Culture: NO GROWTH
SPECIAL REQUESTS: ADEQUATE
Special Requests: ADEQUATE

## 2017-05-24 LAB — LACTIC ACID, PLASMA: LACTIC ACID: 18 mg/dL — ABNORMAL HIGH (ref 4–16)

## 2017-05-25 ENCOUNTER — Encounter: Payer: Self-pay | Admitting: Family Medicine

## 2017-05-27 ENCOUNTER — Ambulatory Visit: Payer: BLUE CROSS/BLUE SHIELD | Admitting: Family Medicine

## 2017-05-28 ENCOUNTER — Other Ambulatory Visit: Payer: Self-pay | Admitting: Family Medicine

## 2017-05-28 MED ORDER — INSULIN GLARGINE 100 UNIT/ML ~~LOC~~ SOLN: 20.0000 [IU] | Freq: Every day | SUBCUTANEOUS | 11 refills | Status: DC

## 2017-05-29 ENCOUNTER — Ambulatory Visit (INDEPENDENT_AMBULATORY_CARE_PROVIDER_SITE_OTHER): Payer: BLUE CROSS/BLUE SHIELD | Admitting: Psychiatry

## 2017-05-29 ENCOUNTER — Encounter (HOSPITAL_COMMUNITY): Payer: Self-pay | Admitting: Psychiatry

## 2017-05-29 VITALS — BP 180/96 | HR 77 | Ht 67.0 in | Wt 225.0 lb

## 2017-05-29 DIAGNOSIS — F331 Major depressive disorder, recurrent, moderate: Secondary | ICD-10-CM

## 2017-05-29 DIAGNOSIS — Z811 Family history of alcohol abuse and dependence: Secondary | ICD-10-CM

## 2017-05-29 DIAGNOSIS — F419 Anxiety disorder, unspecified: Secondary | ICD-10-CM

## 2017-05-29 DIAGNOSIS — F4310 Post-traumatic stress disorder, unspecified: Secondary | ICD-10-CM | POA: Diagnosis not present

## 2017-05-29 DIAGNOSIS — Z818 Family history of other mental and behavioral disorders: Secondary | ICD-10-CM

## 2017-05-29 DIAGNOSIS — Z813 Family history of other psychoactive substance abuse and dependence: Secondary | ICD-10-CM

## 2017-05-29 DIAGNOSIS — F17210 Nicotine dependence, cigarettes, uncomplicated: Secondary | ICD-10-CM

## 2017-05-29 MED ORDER — CHLORPROMAZINE HCL 25 MG PO TABS: 25.0000 mg | ORAL_TABLET | Freq: Every day | ORAL | 2 refills | Status: DC

## 2017-05-29 MED ORDER — FLUOXETINE HCL 60 MG PO TABS: 60.0000 mg | ORAL_TABLET | Freq: Every day | ORAL | 2 refills | Status: DC

## 2017-05-29 MED ORDER — CLONAZEPAM 0.5 MG PO TABS: 0.5000 mg | ORAL_TABLET | Freq: Three times a day (TID) | ORAL | 2 refills | Status: DC | PRN

## 2017-05-29 MED ORDER — LAMOTRIGINE 200 MG PO TABS: 200.0000 mg | ORAL_TABLET | Freq: Every day | ORAL | 2 refills | Status: DC

## 2017-05-29 NOTE — Progress Notes (Signed)
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 Paresthesias: No  Musculoskeletal: Strength & Muscle Tone: within normal limits Gait & Station: normal Patient leans: [name]  Psychiatric Specialty Exam: Review of Systems  Constitutional: Negative.   HENT: Negative.   Eyes: Negative.   Respiratory: Negative.   Cardiovascular: Negative.   Musculoskeletal: Positive for joint pain.  Skin: Negative.  Negative for itching and rash.  Neurological: Positive for headaches.  Psychiatric/Behavioral: The patient is nervous/anxious.  Blood pressure (!) 180/96, pulse 77, height 5' 7" (1.702 m), weight 225 lb (102.1 kg).There is no height or weight on file to calculate BMI.  General Appearance: Casual  Eye Contact:  Good  Speech:  Clear and Coherent  Volume:  Normal  Mood:  Anxious   Affect:  Congruent  Thought Process:  Goal Directed  Orientation:  Full (Time, Place, and Person)  Thought Content: WDL and Logical   Suicidal Thoughts:  No  Homicidal Thoughts:  No  Memory:  Immediate;   Good Recent;   Good Remote;   Good  Judgement:  Good  Insight:  Good  Psychomotor Activity:  Normal  Concentration:  Concentration: Fair and Attention Span: Good  Recall:  Good  Fund of Knowledge: Good  Language: Good  Akathisia:  No  Handed:  Right  AIMS (if indicated):  0  Assets:  Communication Skills Desire for Improvement Housing Resilience Social Support  ADL's:  Intact  Cognition: WNL  Sleep:  Improved from the past     Assessment: Major depressive disorder, recurrent.  Posttraumatic stress disorder.  Anxiety disorder NOS  Plan: I reviewed records from other provider including recent blood work results.  When she was admitted her hemoglobin A1c was 11.  She is seeing her endocrinologist in a regular basis.  She is hoping to drop her hemoglobin A1c on her next appointment.  She is getting insulin now.  She like to Thorazine but she feels the dose is not strong.  I recommended to try Thorazine 25 mg to help insomnia.  In the Lamictal 200 mg daily, Klonopin 0.5 mg 3 times a day and Prozac 60 mg daily.  Discussed in limb medication side effects and benefits.  She has no tremors or shakes.  Her blood pressure is high and she was diagnosed with resistant hypertension.  She has no other associated symptoms.  She still have residual anxiety and I recommended to consider intensive outpatient program.  We will provide information about the program.  She also interested to join support group for diabetes.  Recommended to call us back if she has any question, concern or if she feels worsening of the symptom.  Patient does not ask for early refills for the benzodiazepine.  Follow-up in 3 months.  Discuss safety concern that anytime having active suicidal thoughts or homicidal thoughts  and she need to call 911 or go to the local emergency room.  Time spent 25 minutes.  More than 50% of the time spent in psychoeducation, counseling and coordination of care.  Discuss safety plan that anytime having active suicidal thoughts or homicidal thoughts then patient need to call 911 or go to the local emergency room.   ARFEEN,SYED T., MD 05/29/2017, 11:18 AM

## 2017-05-30 ENCOUNTER — Encounter: Payer: Self-pay | Admitting: Cardiovascular Disease

## 2017-05-31 ENCOUNTER — Other Ambulatory Visit: Payer: Self-pay

## 2017-05-31 MED ORDER — SPIRONOLACTONE 25 MG PO TABS: 25.0000 mg | ORAL_TABLET | Freq: Every day | ORAL | 3 refills | Status: DC

## 2017-06-03 ENCOUNTER — Other Ambulatory Visit: Payer: Self-pay

## 2017-06-03 DIAGNOSIS — Z79899 Other long term (current) drug therapy: Secondary | ICD-10-CM

## 2017-06-03 DIAGNOSIS — Z5181 Encounter for therapeutic drug level monitoring: Secondary | ICD-10-CM

## 2017-06-05 DIAGNOSIS — G44009 Cluster headache syndrome, unspecified, not intractable: Secondary | ICD-10-CM | POA: Diagnosis not present

## 2017-06-06 DIAGNOSIS — G8918 Other acute postprocedural pain: Secondary | ICD-10-CM | POA: Diagnosis not present

## 2017-06-07 ENCOUNTER — Other Ambulatory Visit: Payer: Self-pay

## 2017-06-09 ENCOUNTER — Encounter: Payer: Self-pay | Admitting: Family Medicine

## 2017-06-10 ENCOUNTER — Other Ambulatory Visit: Payer: Self-pay | Admitting: *Deleted

## 2017-06-10 DIAGNOSIS — Z79899 Other long term (current) drug therapy: Secondary | ICD-10-CM

## 2017-06-10 DIAGNOSIS — Z5181 Encounter for therapeutic drug level monitoring: Secondary | ICD-10-CM

## 2017-06-11 ENCOUNTER — Telehealth: Payer: Self-pay | Admitting: *Deleted

## 2017-06-11 ENCOUNTER — Telehealth: Payer: Self-pay | Admitting: Cardiovascular Disease

## 2017-06-11 ENCOUNTER — Other Ambulatory Visit (INDEPENDENT_AMBULATORY_CARE_PROVIDER_SITE_OTHER): Payer: BLUE CROSS/BLUE SHIELD | Admitting: *Deleted

## 2017-06-11 DIAGNOSIS — Z5181 Encounter for therapeutic drug level monitoring: Secondary | ICD-10-CM

## 2017-06-11 NOTE — Telephone Encounter (Signed)
No answer after ringing several times. Attempted to reach patient to discuss recent MyChart message concerning her low blood pressures.

## 2017-06-11 NOTE — Telephone Encounter (Signed)
There is no telephone note or order from Dr Deborra Medina that I can see for patient to have Troponin today. Will have patient contact her PCP regarding additional labs if needed.

## 2017-06-11 NOTE — Telephone Encounter (Signed)
Pt returning our call please call back at [phone]

## 2017-06-11 NOTE — Telephone Encounter (Signed)
After ringing several times, no answer and no voicemail available to leave a message.

## 2017-06-11 NOTE — Telephone Encounter (Signed)
Pt is here today for labs and would like to know if she can get her Traponin levels checked Per Dr Deborra Medina (pcp) States in Sunday BP was a bit low and had some concerns  Please advise

## 2017-06-12 ENCOUNTER — Ambulatory Visit (INDEPENDENT_AMBULATORY_CARE_PROVIDER_SITE_OTHER): Payer: BLUE CROSS/BLUE SHIELD | Admitting: Physician Assistant

## 2017-06-12 ENCOUNTER — Encounter: Payer: Self-pay | Admitting: Physician Assistant

## 2017-06-12 VITALS — BP 136/82 | HR 87 | Ht 67.0 in | Wt 222.5 lb

## 2017-06-12 DIAGNOSIS — R0989 Other specified symptoms and signs involving the circulatory and respiratory systems: Secondary | ICD-10-CM

## 2017-06-12 DIAGNOSIS — Z720 Tobacco use: Secondary | ICD-10-CM

## 2017-06-12 DIAGNOSIS — I739 Peripheral vascular disease, unspecified: Secondary | ICD-10-CM

## 2017-06-12 DIAGNOSIS — I779 Disorder of arteries and arterioles, unspecified: Secondary | ICD-10-CM

## 2017-06-12 DIAGNOSIS — E785 Hyperlipidemia, unspecified: Secondary | ICD-10-CM

## 2017-06-12 DIAGNOSIS — I2510 Atherosclerotic heart disease of native coronary artery without angina pectoris: Secondary | ICD-10-CM | POA: Diagnosis not present

## 2017-06-12 DIAGNOSIS — G44021 Chronic cluster headache, intractable: Secondary | ICD-10-CM

## 2017-06-12 LAB — BASIC METABOLIC PANEL
BUN/Creatinine Ratio: 13 (ref 9–23)
BUN: 11 mg/dL (ref 6–24)
CALCIUM: 10.2 mg/dL (ref 8.7–10.2)
CHLORIDE: 92 mmol/L — AB (ref 96–106)
CO2: 25 mmol/L (ref 20–29)
CREATININE: 0.85 mg/dL (ref 0.57–1.00)
GFR calc non Af Amer: 84 mL/min/{1.73_m2} (ref >59)
GFR, EST AFRICAN AMERICAN: 97 mL/min/{1.73_m2} (ref >59)
Glucose: 161 mg/dL — ABNORMAL HIGH (ref 65–99)
Potassium: 3.7 mmol/L (ref 3.5–5.2)
Sodium: 136 mmol/L (ref 134–144)

## 2017-06-12 NOTE — Telephone Encounter (Signed)
Patient seen by Christell Faith, PA-C in office today.

## 2017-06-12 NOTE — Patient Instructions (Signed)
Medication Instructions:  Please HOLD your spironolactone Please send Thurmond Butts a message via [facility] on Monday (or sooner if needed) with your BP readings  Labwork: None  Testing/Procedures: None  Follow-Up: 1 month w/ Dr. Fletcher Anon  If you need a refill on your cardiac medications before your next appointment, please call your pharmacy.

## 2017-06-12 NOTE — Progress Notes (Signed)
Cardiology Office Note Date:  06/12/2017  Patient ID:  Erika [name], Erika [name] Jun 19, 1971, MRN [id number] PCP:  Lucille Passy, MD  Cardiologist:  Dr. Fletcher Anon, MD    Chief Complaint: Labile hypertension  History of Present Illness: Erika [name] is a 46 y.o. female with history of CAD s/p 4 vessel CABG (LIMA-LAD, SVG-Diag, VG-OM, VG-RCA) in the setting of a NSTEMI and right CEA at the same time in 06/2016, refractory hypertension without evidence of secondary HTN with labile BP, diastolic dysfunction, DM2, obesity, PTSD, and NASH who presents for evaluation of labile HTN.  Prior HTN workup from 02/2016 showed a normal aldosterone level with an elvated renin level. Ratio was normal. Catecholamines and metanephrines were normal. Renal ultrasound in 02/2016 was suggestive RAS. She underwent renal angiogram in 03/2016 that showed no significant RAS.   She presented in July, 2017 with non-ST elevation myocardial infarction. She underwent cardiac catheterization which showed severe three-vessel coronary artery disease.  Preop carotid Doppler showed 80-99% right ICA stenosis. She underwent CABG and right carotid endarterectomy at the same time in July of 2017. She was rehospitalized in September with chest pain in the setting of uncontrolled hypertension. Cardiac catheterization showed patent grafts. The LAD had diffuse disease distal to the anastomosis and was very small in caliber. Ejection fraction was normal with mildly elevated left ventricular end-diastolic pressure. She was again hospitalized recently in late 04/2017 due to hypotension. She was noted to be in DKA and felt to have sepsis from uncertain organism. Treated with empiric antibiotics. Systolic blood pressure was in the 70s. She reported exertional chest and jaw pain. She was given 3 L of normal saline with improvement. Troponin was 0.11. Glucose was 503. ECG showed sinus rhythm with diffuse ST depression. Lactic acid was 5. Her  antihypertensives were held at discharge. She followed up with PCP on 5/29 with BP 178/100. Outpatient labs that day showed a troponin found to be down trending from admission troponin (0.11-->0.09). Lactic acid mildly elevated at 18 (different reference ranges noted from inpatient to outpatient, with different units noted). SCr at baseline 0.8. K+ 3.4. Glucose 226. She was most recently seen by Dr. Fletcher Anon on 05/23/17 for follow up and had not required any antihypertensive medications since her discahrge at that time other than Imdur. She was not having any chest pain, though did note some jaw weakness that was different than her prior angina. It was felt her elevated troponin was 2/2 demand ischemia in the setting of her hypotension. A1c had trended from 6-->11 without explanation. She continued to smoke. BP was noted to be 122/80 with a heart rate of 100 bpm. It remained unclear why she was having such marge flucuations in her BP. She was resumed on Coreg 12.5 mg bid along with Imdur. Her spironolactone and Edarbyclor were continued to be held. Importance of daily meciations was advised. Since that visit she has continued to note elevated blood sugars which she is working on with her PCP. She contacted our office on 6/7 with elevated BP of 194/97 ranging to 168/80. She was advised to resume spironolactone 25 mg daily followed by recheck bemt in 1 week. She missed that follow up lab visit 2/2 family emergency. She again contacted our office on 6/18 with episode of low BP with a range of 122/43 dropping to 79/30s without medications. She noted orthostasis. Heart rate in the 110s bpm. She came into the office on 6/19 for follow up bmet after starting spironolactone which showed  SCr 0.85 (baseline 0.8-0.9), BUN 13, K+ 3.7, glucose 161.   Most recent carotid ultrasound from 01/2017 showed widely patent right carotid endarterectomy without evidence of restenosis, 1-39% proximal RICA stenosis. >00% LICA stenosis with left  vertebral artery difficult to visualize due to depth of vessel. Remains on Lipitor with most recent LDL of 64 from 07/2016.  Prior TSH normal since at least 2014.  Patient comes in today for evaluation of her labile blood pressures. She has a long history of refractory hypertension with detailed prior evaluation above. After her recent admission to the hospital in late May she has noticed a steady increase in her blood pressure readings requiring the restarting of her spironolactone along with continuation of her carvedilol and isosorbide mononitrate. However, on 6/17 prior to patient taking any medications that day she was feeling dizzy and checked her blood pressure with a self-reported reading of 70 systolic. She has not had any further readings at home indicating hypotension. She reports "I do not feel good when my blood pressure is in the normal range." She reports holding her antihypertensive medication if her blood pressure is approaching 762 systolic. She denies holding any medications since 6/17. In the morning she is taking isosorbide mononitrate 30 mg as well as carvedilol 12.5 mg. In the evening she is taking her second dose of carvedilol 12.5 mg as well as spironolactone 25 mg. Since 6/17 patient's blood pressure has been running in the 263 to 335 systolic range and patient reports she is feeling well with this range. Never with chest pain, shortness of breath, diaphoresis, nausea, vomiting, presyncope, or syncope. She denies missing any of her medication doses outside of the above. She does note appropriate physiologic response to low blood pressure. She has recently been placed on Thorazine 25 mg daily at bedtime for sleep by psychiatry, starting this medicine approximately 2 months prior. She continues to take clonazepam 0.5 mg 3 times a day as well as fluoxetine 60 mg daily. At today's visit she is asymptomatic and feels at her baseline. She did not bring her BP cuff for comparison to the in  office manual cuff.   Past Medical History:  Diagnosis Date  . Arthralgia of temporomandibular joint   . CAD, multiple vessel    a. cath 06/29/16: ostLM 40%, ostLAD 40%, pLAD 95%, ost-pLCx 60%, pLCx 95%, mLCx 60%, mRCA 95%, D2 50%, LVSF nl;  b. 07/2016 CABG x 4 (LIMA->LAD, VG->Diag, VG->OM, VG->RCA).  . Carotid arterial disease ([place])    a. 07/2016 s/p R CEA.  . Depression   . Diastolic dysfunction    a.e cho 06/28/16: EF 50-55%, mild inf wall HK, GR1DD, mild MR, RV sys fxn nl, mildly dilated LA, PASP nl  . Fatty liver disease, nonalcoholic 4562  . HLD (hyperlipidemia)   . Labile hypertension    a. prior renal ngiogram negative for RAS in 03/2016; b. catecholamines and metanephrines normal, mildly elevated renin with normal aldosterone and normal ratio in 02/2016  . Obesity   . PTSD (post-traumatic stress disorder)   . Tobacco abuse   . Type 2 diabetes mellitus [facility]) January 2017    Past Surgical History:  Procedure Laterality Date  . CARDIAC CATHETERIZATION [name] 06/29/2016   Procedure: Left Heart Cath and Coronary Angiography;  Surgeon: Minna Merritts, MD;  Location: [facility];  Service: Cardiovascular;  Laterality: [name];  . CARDIAC CATHETERIZATION [name] 08/29/2016   Procedure: Left Heart Cath and Cors/Grafts Angiography;  Surgeon: Wellington Hampshire, MD;  Location: [facility];  Service: Cardiovascular;  Laterality: [name];  . CESAREAN SECTION    . CHOLECYSTECTOMY    . CORONARY ARTERY BYPASS GRAFT [name] 07/06/2016   Procedure: CORONARY ARTERY BYPASS GRAFTING (CABG) x four, using left internal mammary artery and right leg greater saphenous vein harvested endoscopically;  Surgeon: Ivin Poot, MD;  Location: [facility];  Service: Open Heart Surgery;  Laterality: [name];  . ENDARTERECTOMY Right 07/06/2016   Procedure: ENDARTERECTOMY CAROTID;  Surgeon: Rosetta Posner, MD;  Location: [facility];  Service: Vascular;  Laterality: Right;  . PERIPHERAL VASCULAR CATHETERIZATION [name] 04/18/2016   Procedure:  Renal Angiography;  Surgeon: Wellington Hampshire, MD;  Location: [facility];  Service: Cardiovascular;  Laterality: [name];  . TEE WITHOUT CARDIOVERSION [name] 07/06/2016   Procedure: TRANSESOPHAGEAL ECHOCARDIOGRAM (TEE);  Surgeon: Ivin Poot, MD;  Location: [facility];  Service: Open Heart Surgery;  Laterality: [name];  . TONSILLECTOMY      Current Outpatient Prescriptions  Medication Sig Dispense Refill  . aspirin EC 325 MG EC tablet Take 1 tablet (325 mg total) by mouth daily. 30 tablet 0  . atorvastatin (LIPITOR) 80 MG tablet Take 1 tablet (80 mg total) by mouth daily at 6 PM. 90 tablet 2  . carvedilol (COREG) 6.25 MG tablet Take 2 tablets (12.5 mg total) by mouth 2 (two) times daily. 60 tablet 6  . chlorproMAZINE (THORAZINE) 25 MG tablet Take 1 tablet (25 mg total) by mouth at bedtime. 30 tablet 2  . clonazePAM (KLONOPIN) 0.5 MG tablet Take 1 tablet (0.5 mg total) by mouth 3 (three) times daily as needed for anxiety. 90 tablet 2  . FLUoxetine HCl 60 MG TABS Take 60 mg by mouth daily. 30 tablet 2  . HYDROcodone-acetaminophen (NORCO) 7.5-325 MG tablet Take 1 tablet by mouth every 6 (six) hours as needed for moderate pain. 60 tablet 0  . insulin glargine (LANTUS) 100 UNIT/ML injection Inject 0.2 mLs (20 Units total) into the skin at bedtime. 10 mL 11  . insulin NPH-regular Human (NOVOLIN 70/30) (70-30) 100 UNIT/ML injection Take 5 UNITs twice a day and follow with Primary MD 10 mL 3  . INSULIN SYRINGE .5CC/29G 29G X 1/2" 0.5 ML MISC 1 Syringe by Does not apply route 2 (two) times daily. 60 each 1  . Insulin Syringe-Needle U-100 29G X 1/2" 0.3 ML MISC 1 each by Does not apply route 2 (two) times daily. 30 each 0  . isosorbide mononitrate (IMDUR) 30 MG 24 hr tablet Take 1 tablet (30 mg total) by mouth daily. 30 tablet 6  . lamoTRIgine (LAMICTAL) 200 MG tablet Take 1 tablet (200 mg total) by mouth daily. 30 tablet 2  . nitroGLYCERIN (NITROSTAT) 0.4 MG SL tablet Place 1 tablet (0.4 mg total) under the  tongue every 5 (five) minutes as needed for chest pain. 30 tablet 0  . spironolactone (ALDACTONE) 25 MG tablet Take 1 tablet (25 mg total) by mouth daily. 90 tablet 3   No current facility-administered medications for this visit.     Allergies:   No known allergies   Social History:  The patient  reports that she quit smoking about 2 weeks ago. Her smoking use included Cigarettes. She smoked 0.25 packs per day. She has never used smokeless tobacco. She reports that she does not drink alcohol or use drugs.   Family History:  The patient's family history includes Alcohol abuse in her father; Anxiety disorder in her sister; Diabetes in her father  and mother; Drug abuse in her father; Heart disease in her father; Stroke in her sister. She was adopted.  ROS:   Review of Systems  Constitutional: Positive for malaise/fatigue. Negative for chills, diaphoresis, fever and weight loss.  HENT: Negative for congestion.   Eyes: Negative for discharge and redness.  Respiratory: Negative for cough, hemoptysis, sputum production, shortness of breath and wheezing.   Cardiovascular: Negative for chest pain, palpitations, orthopnea, claudication, leg swelling and PND.  Gastrointestinal: Negative for abdominal pain, blood in stool, heartburn, melena, nausea and vomiting.  Genitourinary: Negative for hematuria.  Musculoskeletal: Negative for falls and myalgias.  Skin: Negative for rash.  Neurological: Positive for headaches. Negative for dizziness, tingling, tremors, sensory change, speech change, focal weakness, loss of consciousness and weakness.  Endo/Heme/Allergies: Does not bruise/bleed easily.  Psychiatric/Behavioral: Negative for substance abuse. The patient is not nervous/anxious.   All other systems reviewed and are negative.    PHYSICAL EXAM:  VS:  BP 136/82 (BP Location: Left Arm, Patient Position: Sitting, Cuff Size: Normal)   Pulse 87   Ht 5\' 7"  (1.702 m)   Wt 222 lb 8 oz (100.9 kg)   BMI  34.85 kg/m  BMI: Body mass index is 34.85 kg/m.  Physical Exam  Constitutional: She is oriented to person, place, and time. She appears well-developed and well-nourished.  Tobacco odor noted.  HENT:  Head: Normocephalic and atraumatic.  Eyes: Right eye exhibits no discharge. Left eye exhibits no discharge.  Neck: Normal range of motion. No JVD present.  Right sided carotid endarterectomy scar noted.  Cardiovascular: Normal rate, regular rhythm, S1 normal, S2 normal and normal heart sounds.  Exam reveals no distant heart sounds, no friction rub, no midsystolic click and no opening snap.   No murmur heard. Bypass surgery scar noted.  Pulmonary/Chest: Effort normal and breath sounds normal. No respiratory distress. She has no decreased breath sounds. She has no wheezes. She has no rales. She exhibits no tenderness.  Abdominal: Soft. She exhibits no distension. There is no tenderness.  Musculoskeletal: She exhibits no edema.  Neurological: She is alert and oriented to person, place, and time.  Skin: Skin is warm and dry. No cyanosis. Nails show no clubbing.  Left forearm tattoo noted.  Psychiatric: She has a normal mood and affect. Her speech is normal and behavior is normal. Judgment and thought content normal.     EKG:  Was not ordered today.   Recent Labs: 06/30/2016: TSH 1.485 05/19/2017: B Natriuretic Peptide 28.0; Magnesium 1.6 05/20/2017: Hemoglobin 13.1; Platelets 335 05/21/2017: ALT 43 06/11/2017: BUN 11; Creatinine, Ser 0.85; Potassium 3.7; Sodium 136  06/28/2016: VLDL UNABLE TO CALCULATE IF TRIGLYCERIDE OVER 400 mg/dL 08/21/2016: Chol/HDL Ratio 5.0; Cholesterol, Total 121; HDL 24; LDL Calculated 64; Triglycerides 164   Estimated Creatinine Clearance: 104.1 mL/min (by C-G formula based on SCr of 0.85 mg/dL).   Wt Readings from Last 3 Encounters:  06/12/17 222 lb 8 oz (100.9 kg)  05/23/17 220 lb 4 oz (99.9 kg)  05/21/17 222 lb 12 oz (101 kg)     Other studies  reviewed: Additional studies/records reviewed today include: summarized above  ASSESSMENT AND PLAN:  1. Labile hypertension:Patient has had a long standing history of refractory hypertension with detailed secondary hypertension workup as above. Most recently, patient was admitted to the hospital in late May for hypotension with systolic blood pressure in the 70s in the setting of DKA with presumed sepsis of uncertain etiology. BP medications were held at that time and have  since been slowly reinitiated in the setting of accelerated hypertension. On 6/17 patient noted an episode of hypotension with systolic blood pressure in the 70s prior to taking any medications. There does appear to be some degree of medication noncompliance as patient reports she will self hold medications if she feels her blood pressure is "too low" for her. It seems it would be unlikely for her recently started Thorazine 2 be playing a role in this isolated episode of hypotension as she had not taken it since the previous night though I did discuss with her it may be worthwhile for her to discuss this medication with her psychiatrist and make them aware she did have an episode of lower blood pressure. For now, we will hold spironolactone and continue the patient on isosorbide 30 mg daily along with carvedilol 12.5 mg twice a day. She will contact our office the first of the following week with BP readings, sooner if she notes hypotensive readings. This was discussed with Dr. Fletcher Anon who agrees.  2. CAD: No symptoms concerning for ischemia at this time. Continue ASA per primary cardiologist. Continue carvedilol 12.5 mg daily, Lipitor 80 mg daily, Imdur 30 mg daily, and sublingual nitroglycerin when necessary (though advised caution given recent episode of hypotension). No plans for further ischemic evaluation at this time.  3. Carotid artery disease: Status post right sided CEA. It would be unlikely for the patient to be experiencing  carotid baroreceptor failure from this procedure at nearly the 12 month mark from her surgery. It also seems unlikely she is experiencing carotid baroreceptor failure given she demonstrates appropriate chronotropic response to changes in her blood pressure. Recommend continuing aspirin as above along with Lipitor.  4. HLD: Lipitor 80 mg daily.  5. DM2: Followed by PCP. Most recent A1c of 11% from 04/2017, prior of 6.2% from 08/2016.  6. Obesity: Weight loss advised.  7. Cluster headache: Likely playing a role in some of her accelerated hypertension readings at home as she indicates having a headache daily. Has previously been seen by neurology though not currently followed. She does use home oxygen therapy for severe headaches with self-reported improvement for approximately 30 minutes. Perhaps she would benefit from revisiting neurology in an effort to decrease her headache frequency. Smoking cessation is also advised.  8. Tobacco abuse: Cessation is advised.  Disposition: F/u with Dr. Fletcher Anon, MD in 1 month.   Current medicines are reviewed at length with the patient today.  The patient did not have any concerns regarding medicines.  Melvern Banker PA-C 06/12/2017 2:32 PM     [address] [place], [place] [address] [phone]

## 2017-06-14 DIAGNOSIS — M9901 Segmental and somatic dysfunction of cervical region: Secondary | ICD-10-CM | POA: Diagnosis not present

## 2017-06-14 DIAGNOSIS — M542 Cervicalgia: Secondary | ICD-10-CM | POA: Diagnosis not present

## 2017-06-14 DIAGNOSIS — M9903 Segmental and somatic dysfunction of lumbar region: Secondary | ICD-10-CM | POA: Diagnosis not present

## 2017-06-14 DIAGNOSIS — M545 Low back pain: Secondary | ICD-10-CM | POA: Diagnosis not present

## 2017-06-17 ENCOUNTER — Other Ambulatory Visit: Payer: Self-pay | Admitting: Nurse Practitioner

## 2017-06-20 ENCOUNTER — Encounter: Payer: Self-pay | Admitting: Family Medicine

## 2017-06-20 ENCOUNTER — Ambulatory Visit (INDEPENDENT_AMBULATORY_CARE_PROVIDER_SITE_OTHER): Payer: BLUE CROSS/BLUE SHIELD | Admitting: Family Medicine

## 2017-06-20 VITALS — BP 160/120 | HR 88 | Wt 221.0 lb

## 2017-06-20 DIAGNOSIS — Z79891 Long term (current) use of opiate analgesic: Secondary | ICD-10-CM | POA: Diagnosis not present

## 2017-06-20 DIAGNOSIS — I25110 Atherosclerotic heart disease of native coronary artery with unstable angina pectoris: Secondary | ICD-10-CM

## 2017-06-20 DIAGNOSIS — I10 Essential (primary) hypertension: Secondary | ICD-10-CM

## 2017-06-20 DIAGNOSIS — E1169 Type 2 diabetes mellitus with other specified complication: Secondary | ICD-10-CM

## 2017-06-20 MED ORDER — HYDROCODONE-ACETAMINOPHEN 7.5-325 MG PO TABS: 1.0000 | ORAL_TABLET | Freq: Four times a day (QID) | ORAL | 0 refills | Status: DC | PRN

## 2017-06-20 NOTE — Progress Notes (Signed)
Pre visit review using our clinic review tool, if applicable. No additional management support is needed unless otherwise documented below in the visit note. 

## 2017-06-20 NOTE — Assessment & Plan Note (Signed)
Deteriorated. She will restart spironolactone and contact cardiology along with her psychiatrist. She will also check her BP regularly at home and continue to email me with readings.

## 2017-06-20 NOTE — Progress Notes (Signed)
Subjective:   Patient ID: Erika [name], female    DOB: 05/10/1971, 46 y.o.   MRN: [id number]  Erika [name] is a pleasant 46 y.o. year old female who presents to clinic today with Blood Pressure Check  on 06/20/2017  HPI:  Labile HTN- has a long standing history of difficult to control hypertension but recently she has been having hypotensive episodes which are symptomatic.  Saw Erika [name], cardiology PA on 06/12/17 for this issue.  Note reviewed.  He felt perhaps Thorazine could be contributing to these episodes and advised patient to discuss this with her psychiatrist. He also held her spironolactone.  She comes in today and her blood pressure is again elevated.  She said she knew it would be because she gets a specific type of headache when her BP is high.  Her blood sugars are improving with current dose of Lantus.   Lab Results  Component Value Date   HGBA1C 11.0 (H) 05/19/2017   Current Outpatient Prescriptions on File Prior to Visit  Medication Sig Dispense Refill  . aspirin EC 325 MG EC tablet Take 1 tablet (325 mg total) by mouth daily. 30 tablet 0  . atorvastatin (LIPITOR) 80 MG tablet Take 1 tablet (80 mg total) by mouth daily at 6 PM. 90 tablet 2  . carvedilol (COREG) 6.25 MG tablet Take 2 tablets (12.5 mg total) by mouth 2 (two) times daily. 60 tablet 6  . chlorproMAZINE (THORAZINE) 25 MG tablet Take 1 tablet (25 mg total) by mouth at bedtime. 30 tablet 2  . clonazePAM (KLONOPIN) 0.5 MG tablet Take 1 tablet (0.5 mg total) by mouth 3 (three) times daily as needed for anxiety. 90 tablet 2  . FLUoxetine HCl 60 MG TABS Take 60 mg by mouth daily. 30 tablet 2  . insulin glargine (LANTUS) 100 UNIT/ML injection Inject 0.2 mLs (20 Units total) into the skin at bedtime. 10 mL 11  . insulin NPH-regular Human (NOVOLIN 70/30) (70-30) 100 UNIT/ML injection Take 5 UNITs twice a day and follow with Primary MD 10 mL 3  . Insulin Syringe-Needle U-100 29G X 1/2" 0.3 ML MISC 1  each by Does not apply route 2 (two) times daily. 30 each 0  . isosorbide mononitrate (IMDUR) 30 MG 24 hr tablet TAKE ONE TABLET BY MOUTH ONCE DAILY 30 tablet 6  . lamoTRIgine (LAMICTAL) 200 MG tablet Take 1 tablet (200 mg total) by mouth daily. 30 tablet 2  . nitroGLYCERIN (NITROSTAT) 0.4 MG SL tablet Place 1 tablet (0.4 mg total) under the tongue every 5 (five) minutes as needed for chest pain. 30 tablet 0  . spironolactone (ALDACTONE) 25 MG tablet Take 1 tablet (25 mg total) by mouth daily. 90 tablet 3   No current facility-administered medications on file prior to visit.     Allergies  Allergen Reactions  . No Known Allergies     Past Medical History:  Diagnosis Date  . Arthralgia of temporomandibular joint   . CAD, multiple vessel    a. cath 06/29/16: ostLM 40%, ostLAD 40%, pLAD 95%, ost-pLCx 60%, pLCx 95%, mLCx 60%, mRCA 95%, D2 50%, LVSF nl;  b. 07/2016 CABG x 4 (LIMA->LAD, VG->Diag, VG->OM, VG->RCA).  . Carotid arterial disease ([place])    a. 07/2016 s/p R CEA.  . Depression   . Diastolic dysfunction    a.e cho 06/28/16: EF 50-55%, mild inf wall HK, GR1DD, mild MR, RV sys fxn nl, mildly dilated LA, PASP nl  . Fatty liver disease,  nonalcoholic 6301  . HLD (hyperlipidemia)   . Labile hypertension    a. prior renal ngiogram negative for RAS in 03/2016; b. catecholamines and metanephrines normal, mildly elevated renin with normal aldosterone and normal ratio in 02/2016  . Obesity   . PTSD (post-traumatic stress disorder)   . Tobacco abuse   . Type 2 diabetes mellitus [facility]) January 2017    Past Surgical History:  Procedure Laterality Date  . CARDIAC CATHETERIZATION [name] 06/29/2016   Procedure: Left Heart Cath and Coronary Angiography;  Surgeon: Minna Merritts, MD;  Location: [place] [facility];  Service: Cardiovascular;  Laterality: [name];  . CARDIAC CATHETERIZATION [name] 08/29/2016   Procedure: Left Heart Cath and Cors/Grafts Angiography;  Surgeon: Wellington Hampshire, MD;  Location: [place] [facility];  Service: Cardiovascular;  Laterality: [name];  . CESAREAN SECTION    . CHOLECYSTECTOMY    . CORONARY ARTERY BYPASS GRAFT [name] 07/06/2016   Procedure: CORONARY ARTERY BYPASS GRAFTING (CABG) x four, using left internal mammary artery and right leg greater saphenous vein harvested endoscopically;  Surgeon: Ivin Poot, MD;  Location: [place];  Service: Open Heart Surgery;  Laterality: [name];  . ENDARTERECTOMY Right 07/06/2016   Procedure: ENDARTERECTOMY CAROTID;  Surgeon: Rosetta Posner, MD;  Location: [place];  Service: Vascular;  Laterality: Right;  . PERIPHERAL VASCULAR CATHETERIZATION [name] 04/18/2016   Procedure: Renal Angiography;  Surgeon: Wellington Hampshire, MD;  Location: [place] [facility];  Service: Cardiovascular;  Laterality: [name];  . TEE WITHOUT CARDIOVERSION [name] 07/06/2016   Procedure: TRANSESOPHAGEAL ECHOCARDIOGRAM (TEE);  Surgeon: Ivin Poot, MD;  Location: [place];  Service: Open Heart Surgery;  Laterality: [name];  . TONSILLECTOMY      Family History  Problem Relation Age of Onset  . Adopted: Yes  . Diabetes Mother   . Diabetes Father   . Alcohol abuse Father   . Heart disease Father   . Drug abuse Father   . Stroke Sister   . Anxiety disorder Sister     Social History   Social History  . Marital status: Married    Spouse name: [name]  . Number of children: [name]  . Years of education: [name]   Occupational History  . Not on file.   Social History Main Topics  . Smoking status: Former Smoker    Packs/day: 0.25    Types: Cigarettes    Quit date: 05/23/2017  . Smokeless tobacco: Never Used     Comment: patient smokes when she drives.   . Alcohol use No  . Drug use: No  . Sexual activity: Yes    Partners: Male    Birth control/ protection: None   Other Topics Concern  . Not on file   Social History Narrative  . No narrative on file   The PMH, PSH, Social History, Family History, Medications, and allergies have been reviewed in [facility], and have been updated if  relevant.  Review of Systems  Constitutional: Negative.   HENT: Negative.   Eyes: Negative.   Respiratory: Negative.   Cardiovascular: Negative.   Gastrointestinal: Negative.   Musculoskeletal: Negative.   Neurological: Positive for headaches. Negative for dizziness, tremors, seizures, syncope, facial asymmetry, speech difficulty, weakness, light-headedness and numbness.  Hematological: Negative.   All other systems reviewed and are negative.      Objective:    BP (!) 160/120   Pulse 88   Wt 221 lb (100.2 kg)   SpO2 93%   BMI 34.61 kg/m  Physical Exam  Constitutional: She is oriented to person, place, and time. She appears well-developed and well-nourished. No distress.  HENT:  Head: Normocephalic and atraumatic.  Eyes: Conjunctivae are normal.  Neck: Normal range of motion.  Cardiovascular: Normal rate and regular rhythm.   Pulmonary/Chest: Effort normal and breath sounds normal.  Musculoskeletal: Normal range of motion. She exhibits no edema.  Neurological: She is alert and oriented to person, place, and time. No cranial nerve deficit.  Skin: Skin is warm and dry. She is not diaphoretic.  Psychiatric: She has a normal mood and affect. Her behavior is normal. Judgment and thought content normal.  Nursing note and vitals reviewed.         Assessment & Plan:   Coronary artery disease involving native coronary artery of native heart with unstable angina pectoris ([place])  Type 2 diabetes mellitus with other specified complication, without long-term current use of insulin ([place])  Essential hypertension, malignant No Follow-up on file.

## 2017-06-20 NOTE — Patient Instructions (Signed)
Great to see you. Please restart your spironolactone.

## 2017-06-20 NOTE — Assessment & Plan Note (Signed)
Improving. Not yet due for a1c. No changes made to rxs. I question if perhaps if her dramatic increase in her a1c (possibly due to thorazine) has led to her BP issues.

## 2017-06-21 ENCOUNTER — Encounter: Payer: Self-pay | Admitting: Family Medicine

## 2017-06-22 ENCOUNTER — Emergency Department: Payer: BLUE CROSS/BLUE SHIELD

## 2017-06-22 ENCOUNTER — Emergency Department
Admission: EM | Admit: 2017-06-22 | Discharge: 2017-06-22 | Disposition: A | Discharge status: Home / Self Care | Payer: BLUE CROSS/BLUE SHIELD | Attending: Emergency Medicine | Admitting: Emergency Medicine

## 2017-06-22 DIAGNOSIS — Z79899 Other long term (current) drug therapy: Secondary | ICD-10-CM | POA: Diagnosis not present

## 2017-06-22 DIAGNOSIS — R112 Nausea with vomiting, unspecified: Secondary | ICD-10-CM | POA: Diagnosis not present

## 2017-06-22 DIAGNOSIS — Z87891 Personal history of nicotine dependence: Secondary | ICD-10-CM | POA: Insufficient documentation

## 2017-06-22 DIAGNOSIS — Z794 Long term (current) use of insulin: Secondary | ICD-10-CM | POA: Insufficient documentation

## 2017-06-22 DIAGNOSIS — Z7982 Long term (current) use of aspirin: Secondary | ICD-10-CM | POA: Diagnosis not present

## 2017-06-22 DIAGNOSIS — I10 Essential (primary) hypertension: Secondary | ICD-10-CM

## 2017-06-22 DIAGNOSIS — I2510 Atherosclerotic heart disease of native coronary artery without angina pectoris: Secondary | ICD-10-CM | POA: Diagnosis not present

## 2017-06-22 DIAGNOSIS — E119 Type 2 diabetes mellitus without complications: Secondary | ICD-10-CM | POA: Diagnosis not present

## 2017-06-22 DIAGNOSIS — R51 Headache: Secondary | ICD-10-CM | POA: Diagnosis not present

## 2017-06-22 DIAGNOSIS — R519 Headache, unspecified: Secondary | ICD-10-CM

## 2017-06-22 LAB — BASIC METABOLIC PANEL
Anion gap: 8 (ref 5–15)
BUN: 11 mg/dL (ref 6–20)
CALCIUM: 10.2 mg/dL (ref 8.9–10.3)
CHLORIDE: 98 mmol/L — AB (ref 101–111)
CO2: 31 mmol/L (ref 22–32)
Creatinine, Ser: 0.68 mg/dL (ref 0.44–1.00)
GFR calc Af Amer: >60 mL/min (ref >60)
GFR calc non Af Amer: >60 mL/min (ref >60)
Glucose, Bld: 172 mg/dL — ABNORMAL HIGH (ref 65–99)
Potassium: 4.1 mmol/L (ref 3.5–5.1)
Sodium: 137 mmol/L (ref 135–145)

## 2017-06-22 LAB — CBC
HCT: 42.3 % (ref 35.0–47.0)
Hemoglobin: 14.7 g/dL (ref 12.0–16.0)
MCH: 30.2 pg (ref 26.0–34.0)
MCHC: 34.7 g/dL (ref 32.0–36.0)
MCV: 87 fL (ref 80.0–100.0)
Platelets: 408 10*3/uL (ref 150–440)
RBC: 4.87 MIL/uL (ref 3.80–5.20)
RDW: 13.7 % (ref 11.5–14.5)
WBC: 10.7 10*3/uL (ref 3.6–11.0)

## 2017-06-22 LAB — TROPONIN I

## 2017-06-22 MED ORDER — ONDANSETRON HCL 4 MG/2ML IJ SOLN
4.0000 mg | Freq: Once | INTRAMUSCULAR | Status: AC
  Administered 2017-06-22: 4 mg via INTRAVENOUS
  Filled 2017-06-22: qty 2

## 2017-06-22 MED ORDER — LABETALOL HCL 5 MG/ML IV SOLN
20.0000 mg | Freq: Once | INTRAVENOUS | Status: DC
  Filled 2017-06-22: qty 4

## 2017-06-22 MED ORDER — ONDANSETRON HCL 4 MG PO TABS: 4.0000 mg | ORAL_TABLET | Freq: Every day | ORAL | 0 refills | Status: DC | PRN

## 2017-06-22 NOTE — ED Notes (Signed)
Patient transported to X-ray 

## 2017-06-22 NOTE — ED Notes (Signed)
Verbal report to [place], RN regarding pt's presenting complaint and elevated blood pressure in triage; understands pt to room when triage nurse finished with EKG

## 2017-06-22 NOTE — ED Provider Notes (Signed)
[facility] Emergency Department Provider Note  ____________________________________________   First MD Initiated Contact with Patient 06/22/17 2132     (approximate)  I have reviewed the triage vital signs and the nursing notes.   HISTORY  Chief Complaint Headache; Hypertension; and Emesis   HPI Erika [name] is a 46 y.o. female with a history of cluster headache as well as labile blood pressure was presented to emergency department today with hypertension, headache as well as nausea and vomiting. She says that her headache started about 1 PM today and has been slowly increasing. She reports it is a left-sided none 10-type headache at this time. She also says that she has vomited 4 times today. Does not report any blood in the vomit. Does not report any diarrhea.  Patient does not report any neck pain. Says that she has been compliant with her blood pressure medications. Says that she has had a similar presentation in the past with her blood pressure being high. She has had difficulty controlling her blood pressure and has been taken off medications in an effort to balance both hypo-and hypertension. She says that she normally runs in the 443X systolic. Says that she came to the emergency department today in particular because she was concerned about the vomiting which she had when she required a cardiac bypass. However, she denies any chest pain or shortness of breath. Denies any back pain.   Past Medical History:  Diagnosis Date  . Arthralgia of temporomandibular joint   . CAD, multiple vessel    a. cath 06/29/16: ostLM 40%, ostLAD 40%, pLAD 95%, ost-pLCx 60%, pLCx 95%, mLCx 60%, mRCA 95%, D2 50%, LVSF nl;  b. 07/2016 CABG x 4 (LIMA->LAD, VG->Diag, VG->OM, VG->RCA).  . Carotid arterial disease ([place])    a. 07/2016 s/p R CEA.  . Depression   . Diastolic dysfunction    a.e cho 06/28/16: EF 50-55%, mild inf wall HK, GR1DD, mild MR, RV sys fxn nl, mildly dilated  LA, PASP nl  . Fatty liver disease, nonalcoholic 5400  . HLD (hyperlipidemia)   . Labile hypertension    a. prior renal ngiogram negative for RAS in 03/2016; b. catecholamines and metanephrines normal, mildly elevated renin with normal aldosterone and normal ratio in 02/2016  . Obesity   . PTSD (post-traumatic stress disorder)   . Tobacco abuse   . Type 2 diabetes mellitus [facility]) January 2017    Patient Active Problem List   Diagnosis Date Noted  . Elevated troponin I level 05/21/2017  . Sepsis ([place]) 05/19/2017  . Sepsis due to undetermined organism ([place]) 05/19/2017  . Hyperosmolar non-ketotic state in patient with type 2 diabetes mellitus ([place]) 05/19/2017  . Major depressive disorder, recurrent episode, moderate ([place]) 11/19/2016  . Insomnia 10/30/2016  . Hypertension, accelerated with heart disease, without CHF   . Constipation 07/25/2016  . S/P CABG x 4 07/06/2016  . Bradycardia   . CAD (coronary artery disease)   . Carotid stenosis   . CAD in native artery 06/29/2016  . Elevated troponin 06/28/2016  . Essential hypertension, malignant 06/28/2016  . Tobacco abuse 06/28/2016  . Essential hypertension   . Malignant hypertension   . Diabetes ([place])   . Chest pain with high risk for cardiac etiology 06/27/2016  . NSTEMI (non-ST elevated myocardial infarction) ([place]) 06/27/2016  . Proteinuria 03/15/2016  . Renal artery stenosis ([place]) 03/15/2016  . MDD (major depressive disorder) 10/17/2015  . Agoraphobia with panic attacks 04/25/2015  . HTN (hypertension),  malignant 10/20/2013  . Cluster headache 03/20/2012  . HLD (hyperlipidemia) 07/26/2010  . ADJUSTMENT DISORDER WITH MIXED FEATURES 07/26/2010    Past Surgical History:  Procedure Laterality Date  . CARDIAC CATHETERIZATION [name] 06/29/2016   Procedure: Left Heart Cath and Coronary Angiography;  Surgeon: Minna Merritts, MD;  Location: [place] [facility];  Service: Cardiovascular;  Laterality: [name];  . CARDIAC CATHETERIZATION [name]  08/29/2016   Procedure: Left Heart Cath and Cors/Grafts Angiography;  Surgeon: Wellington Hampshire, MD;  Location: [place] [facility];  Service: Cardiovascular;  Laterality: [name];  . CESAREAN SECTION    . CHOLECYSTECTOMY    . CORONARY ARTERY BYPASS GRAFT [name] 07/06/2016   Procedure: CORONARY ARTERY BYPASS GRAFTING (CABG) x four, using left internal mammary artery and right leg greater saphenous vein harvested endoscopically;  Surgeon: Ivin Poot, MD;  Location: [place];  Service: Open Heart Surgery;  Laterality: [name];  . ENDARTERECTOMY Right 07/06/2016   Procedure: ENDARTERECTOMY CAROTID;  Surgeon: Rosetta Posner, MD;  Location: [place];  Service: Vascular;  Laterality: Right;  . PERIPHERAL VASCULAR CATHETERIZATION [name] 04/18/2016   Procedure: Renal Angiography;  Surgeon: Wellington Hampshire, MD;  Location: [place] [facility];  Service: Cardiovascular;  Laterality: [name];  . TEE WITHOUT CARDIOVERSION [name] 07/06/2016   Procedure: TRANSESOPHAGEAL ECHOCARDIOGRAM (TEE);  Surgeon: Ivin Poot, MD;  Location: [place];  Service: Open Heart Surgery;  Laterality: [name];  . TONSILLECTOMY      Prior to Admission medications   Medication Sig Start Date End Date Taking? Authorizing Provider  aspirin EC 325 MG EC tablet Take 1 tablet (325 mg total) by mouth daily. 07/13/16   Nani Skillern, PA-C  atorvastatin (LIPITOR) 80 MG tablet Take 1 tablet (80 mg total) by mouth daily at 6 PM. 12/03/16   Lucille Passy, MD  carvedilol (COREG) 6.25 MG tablet Take 2 tablets (12.5 mg total) by mouth 2 (two) times daily. 05/23/17   Wellington Hampshire, MD  chlorproMAZINE (THORAZINE) 25 MG tablet Take 1 tablet (25 mg total) by mouth at bedtime. 05/29/17   Arfeen, Arlyce Harman, MD  clonazePAM (KLONOPIN) 0.5 MG tablet Take 1 tablet (0.5 mg total) by mouth 3 (three) times daily as needed for anxiety. 05/29/17   Arfeen, Arlyce Harman, MD  FLUoxetine HCl 60 MG TABS Take 60 mg by mouth daily. 05/29/17   Arfeen, Arlyce Harman, MD  HYDROcodone-acetaminophen (NORCO) 7.5-325  MG tablet Take 1 tablet by mouth every 6 (six) hours as needed for moderate pain. 06/20/17   Lucille Passy, MD  insulin glargine (LANTUS) 100 UNIT/ML injection Inject 0.2 mLs (20 Units total) into the skin at bedtime. 05/28/17   Lucille Passy, MD  insulin NPH-regular Human (NOVOLIN 70/30) (70-30) 100 UNIT/ML injection Take 5 UNITs twice a day and follow with Primary MD 05/20/17   Nita Sells, MD  Insulin Syringe-Needle U-100 29G X 1/2" 0.3 ML MISC 1 each by Does not apply route 2 (two) times daily. 05/20/17   Nita Sells, MD  isosorbide mononitrate (IMDUR) 30 MG 24 hr tablet TAKE ONE TABLET BY MOUTH ONCE DAILY 06/17/17   Rogelia Mire, NP  lamoTRIgine (LAMICTAL) 200 MG tablet Take 1 tablet (200 mg total) by mouth daily. 05/29/17   Arfeen, Arlyce Harman, MD  nitroGLYCERIN (NITROSTAT) 0.4 MG SL tablet Place 1 tablet (0.4 mg total) under the tongue every 5 (five) minutes as needed for chest pain. 08/30/16   Florencia Reasons, MD  spironolactone (ALDACTONE) 25 MG tablet  Take 1 tablet (25 mg total) by mouth daily. 05/31/17 08/29/17  Wellington Hampshire, MD    Allergies No known allergies  Family History  Problem Relation Age of Onset  . Adopted: Yes  . Diabetes Mother   . Diabetes Father   . Alcohol abuse Father   . Heart disease Father   . Drug abuse Father   . Stroke Sister   . Anxiety disorder Sister     Social History Social History  Substance Use Topics  . Smoking status: Former Smoker    Packs/day: 0.25    Types: Cigarettes    Quit date: 05/23/2017  . Smokeless tobacco: Never Used     Comment: patient smokes when she drives.   . Alcohol use No    Review of Systems  Constitutional: No fever/chills Eyes: No visual changes. ENT: No sore throat. Cardiovascular: Denies chest pain. Respiratory: Denies shortness of breath. Gastrointestinal: No abdominal pain.  No nausea, no vomiting.  No diarrhea.  No constipation. Genitourinary: Negative for dysuria. Musculoskeletal: Negative for  back pain. Skin: Negative for rash. Neurological: Negative for focal weakness or numbness.   ____________________________________________   PHYSICAL EXAM:  VITAL SIGNS: ED Triage Vitals  Enc Vitals Group     BP 06/22/17 2122 (!) 225/100     Pulse Rate 06/22/17 2122 71     Resp 06/22/17 2122 18     Temp 06/22/17 2122 98.5 F (36.9 C)     Temp Source 06/22/17 2122 Oral     SpO2 06/22/17 2122 96 %     Weight 06/22/17 2123 221 lb (100.2 kg)     Height 06/22/17 2123 5\' 7"  (1.702 m)     Head Circumference --      Peak Flow --      Pain Score 06/22/17 2122 7     Pain Loc --      Pain Edu? --      Excl. in [place]? --     Constitutional: Alert and oriented. Well appearing and in no acute distress. Eyes: Conjunctivae are normal.  Head: Atraumatic. Nose: No congestion/rhinnorhea. Mouth/Throat: Mucous membranes are moist.  Neck: No stridor.   Cardiovascular: Normal rate, regular rhythm. Grossly normal heart sounds.  Respiratory: Normal respiratory effort.  No retractions. Lungs CTAB. Gastrointestinal: Soft and nontender. No distention.  Musculoskeletal: No lower extremity tenderness nor edema.  No joint effusions. Neurologic:  Normal speech and language. No gross focal neurologic deficits are appreciated. Skin:  Skin is warm, dry and intact. No rash noted. Psychiatric: Mood and affect are normal. Speech and behavior are normal.  ____________________________________________   LABS (all labs ordered are listed, but only abnormal results are displayed)  Labs Reviewed  BASIC METABOLIC PANEL - Abnormal; Notable for the following:       Result Value   Chloride 98 (*)    Glucose, Bld 172 (*)    All other components within normal limits  CBC  TROPONIN I   ____________________________________________  EKG  ED ECG REPORT I, Doran Stabler, the attending physician, personally viewed and interpreted this ECG.   Date: 06/22/2017  EKG Time: 2117  Rate: 70  Rhythm: normal  sinus rhythm  Axis: normal  Intervals:none  ST&T Change: T wave inversions in 1 as well as aVL as well as V4 through V6. No significant change from previous. No ST elevation or depression.  ____________________________________________  RADIOLOGY   ____________________________________________   PROCEDURES  Procedure(s) performed:   Procedures  Critical Care performed:  ____________________________________________   INITIAL IMPRESSION / ASSESSMENT AND PLAN / ED COURSE  Pertinent labs & imaging results that were available during my care of the patient were reviewed by me and considered in my medical decision making (see chart for details).  ----------------------------------------- 11:03 PM on 06/22/2017 -----------------------------------------  Patient received Zofran and her pressure resolved down to the 160s over 80s. She says that her headache is down to a 5 out of 10 which she says is her baseline headache which she says she has had for years. Reassuring lab work. No CBD EKG changes. The patient will be discharged with Zofran for home use. We'll be following up at a scheduled appointment with her cardiologist this Monday. Patient without any neuro deficits. Very unlikely to be bleed or malignant hypertension at this time.      ____________________________________________   FINAL CLINICAL IMPRESSION(S) / ED DIAGNOSES  Hypertension. Nausea and vomiting. Headache.    NEW MEDICATIONS STARTED DURING THIS VISIT:  New Prescriptions   No medications on file     Note:  This document was prepared using Dragon voice recognition software and may include unintentional dictation errors.     Orbie Pyo, MD 06/22/17 [phone]

## 2017-06-24 ENCOUNTER — Ambulatory Visit: Payer: Self-pay | Admitting: Cardiovascular Disease

## 2017-06-28 DIAGNOSIS — M9901 Segmental and somatic dysfunction of cervical region: Secondary | ICD-10-CM | POA: Diagnosis not present

## 2017-06-28 DIAGNOSIS — M545 Low back pain: Secondary | ICD-10-CM | POA: Diagnosis not present

## 2017-06-28 DIAGNOSIS — M9903 Segmental and somatic dysfunction of lumbar region: Secondary | ICD-10-CM | POA: Diagnosis not present

## 2017-06-28 DIAGNOSIS — M542 Cervicalgia: Secondary | ICD-10-CM | POA: Diagnosis not present

## 2017-07-01 ENCOUNTER — Encounter: Payer: Self-pay | Admitting: Family Medicine

## 2017-07-02 ENCOUNTER — Other Ambulatory Visit: Payer: Self-pay | Admitting: Family Medicine

## 2017-07-02 MED ORDER — NORETHINDRONE ACET-ETHINYL EST 1-20 MG-MCG PO TABS: 1.0000 | ORAL_TABLET | Freq: Every day | ORAL | 11 refills | Status: DC

## 2017-07-04 ENCOUNTER — Other Ambulatory Visit: Payer: Self-pay | Admitting: Family Medicine

## 2017-07-04 ENCOUNTER — Encounter: Payer: Self-pay | Admitting: Family Medicine

## 2017-07-04 MED ORDER — AMOXICILLIN-POT CLAVULANATE 875-125 MG PO TABS: 1.0000 | ORAL_TABLET | Freq: Two times a day (BID) | ORAL | 0 refills | Status: DC

## 2017-07-05 ENCOUNTER — Other Ambulatory Visit: Payer: Self-pay | Admitting: Family Medicine

## 2017-07-05 ENCOUNTER — Telehealth: Payer: Self-pay

## 2017-07-05 DIAGNOSIS — G44009 Cluster headache syndrome, unspecified, not intractable: Secondary | ICD-10-CM | POA: Diagnosis not present

## 2017-07-05 MED ORDER — MAGIC MOUTHWASH W/LIDOCAINE: 5.0000 mL | Freq: Three times a day (TID) | ORAL | 0 refills | Status: DC | PRN

## 2017-07-05 NOTE — Progress Notes (Signed)
Faxed

## 2017-07-05 NOTE — Telephone Encounter (Signed)
Erin at [facility] left v/m that they received an rx for magic mouthwash with lidocaine compound and request cb with which ingredients you want in mouthwash. Erin request cb.

## 2017-07-05 NOTE — Telephone Encounter (Signed)
1 part viscous lidocaise 2%, 1 part maalox and 1 part benadryl

## 2017-07-05 NOTE — Progress Notes (Unsigned)
Please call in rx as entered below

## 2017-07-05 NOTE — Telephone Encounter (Signed)
[facility] calls back because pt wants to wait on picking up med now; [place] notified as instructed and voiced understanding.

## 2017-07-07 IMAGING — DX DG CHEST 2V
2 series · 2 of 2 positions shown · non-contrast
Comparison: 08/28/2016

CLINICAL DATA: Neck pain, throat pain, jaw pain, chest pressure

EXAM:
CHEST  2 VIEW

[chest pa]
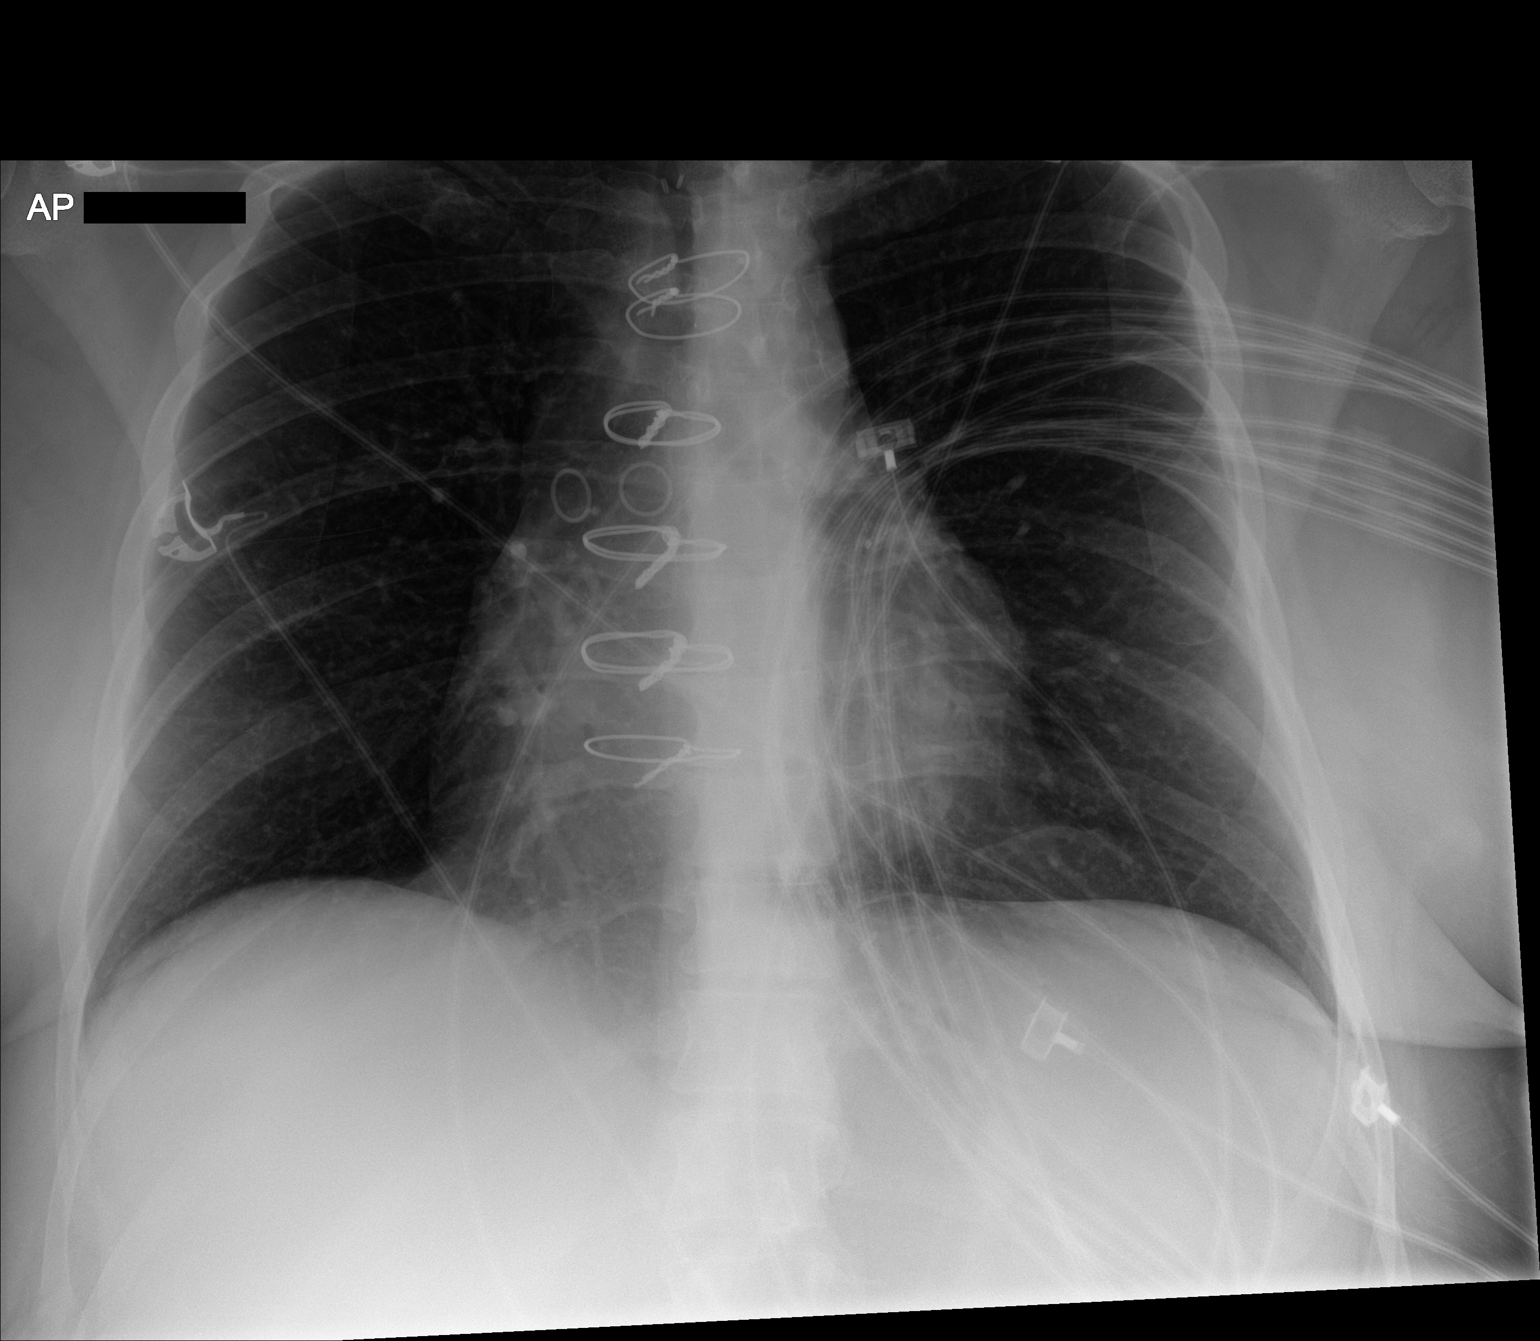

[chest lat]
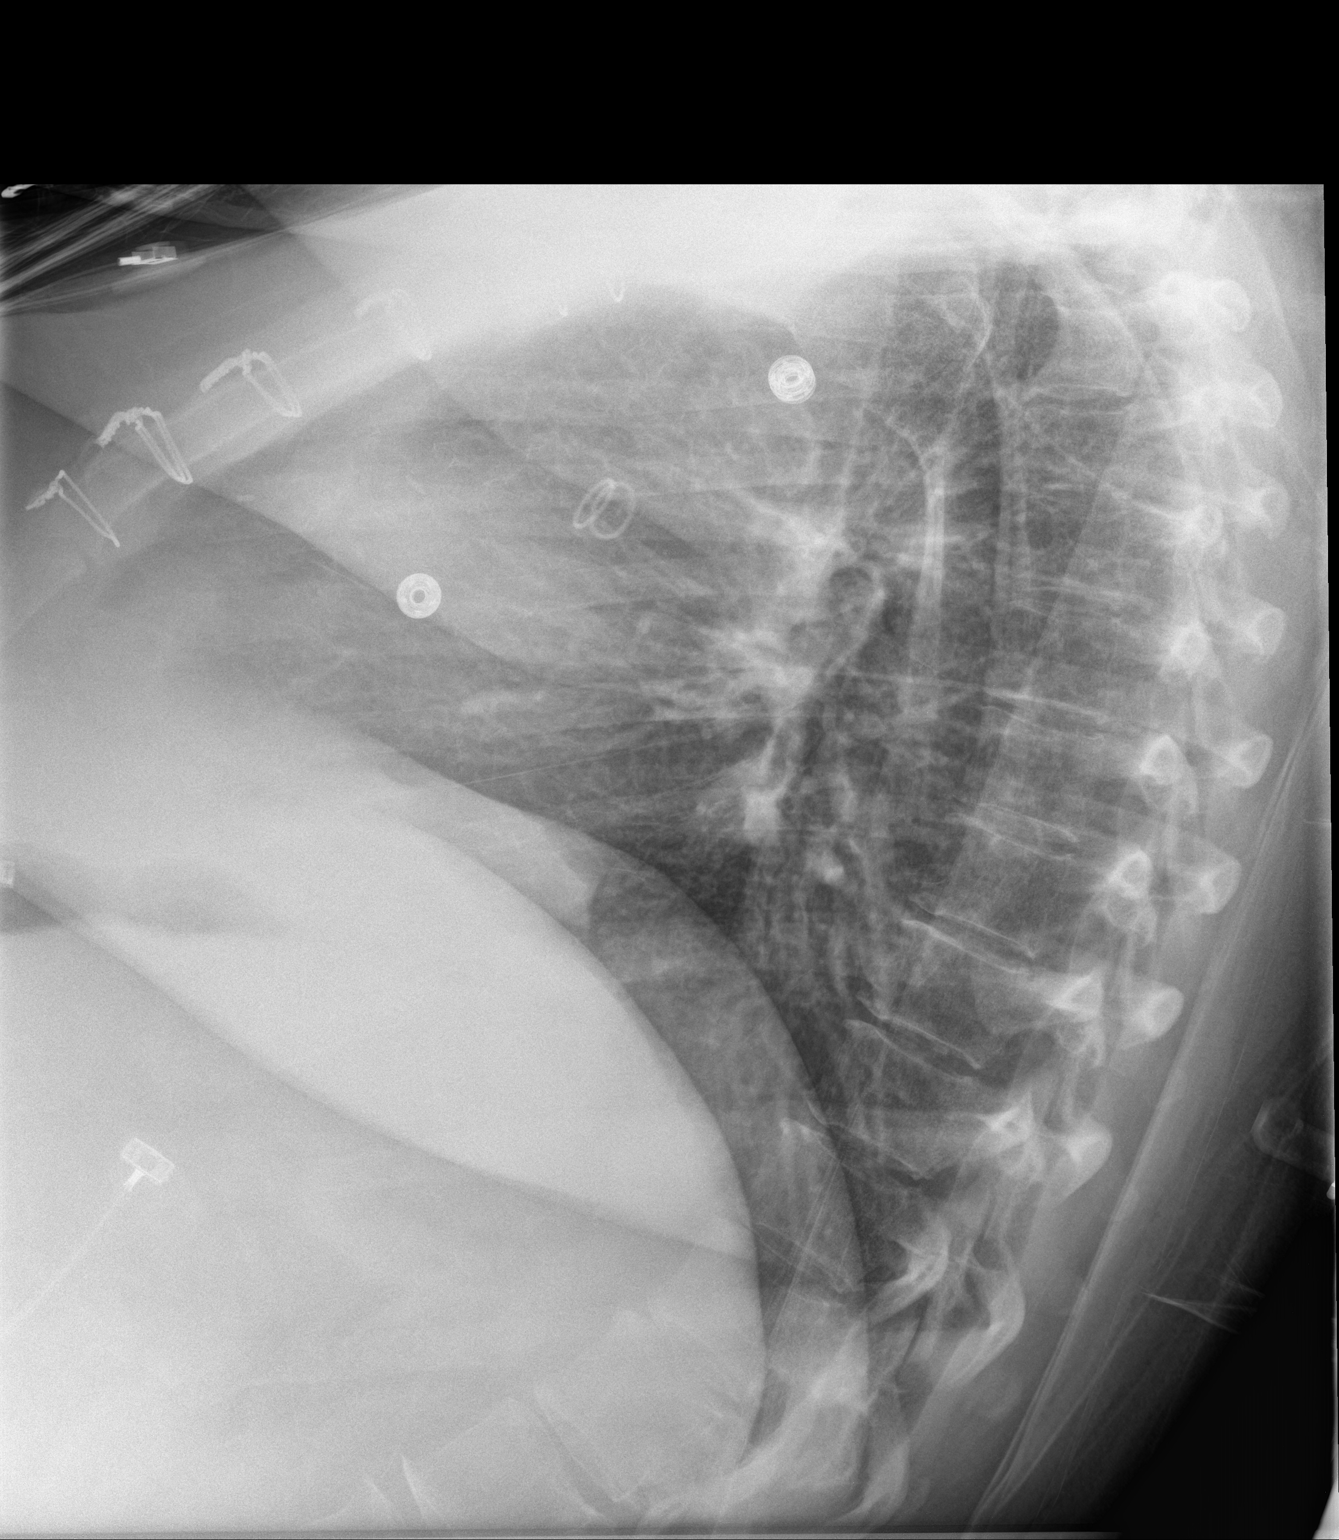

[2 of 2 positions shown; findings below may reference images not displayed]

FINDINGS: There is no focal parenchymal opacity. There is no pleural effusion
or pneumothorax. The heart and mediastinal contours are
unremarkable. There is evidence of prior CABG.

The osseous structures are unremarkable.
IMPRESSION: No active cardiopulmonary disease.

## 2017-07-17 ENCOUNTER — Ambulatory Visit (INDEPENDENT_AMBULATORY_CARE_PROVIDER_SITE_OTHER): Payer: BLUE CROSS/BLUE SHIELD | Admitting: Nurse Practitioner

## 2017-07-17 ENCOUNTER — Encounter: Payer: Self-pay | Admitting: Nurse Practitioner

## 2017-07-17 VITALS — BP 160/82 | HR 91 | Ht 67.0 in | Wt 225.2 lb

## 2017-07-17 DIAGNOSIS — Z794 Long term (current) use of insulin: Secondary | ICD-10-CM

## 2017-07-17 DIAGNOSIS — I119 Hypertensive heart disease without heart failure: Secondary | ICD-10-CM | POA: Diagnosis not present

## 2017-07-17 DIAGNOSIS — I2510 Atherosclerotic heart disease of native coronary artery without angina pectoris: Secondary | ICD-10-CM

## 2017-07-17 DIAGNOSIS — E785 Hyperlipidemia, unspecified: Secondary | ICD-10-CM | POA: Diagnosis not present

## 2017-07-17 DIAGNOSIS — Z720 Tobacco use: Secondary | ICD-10-CM | POA: Diagnosis not present

## 2017-07-17 DIAGNOSIS — E119 Type 2 diabetes mellitus without complications: Secondary | ICD-10-CM | POA: Diagnosis not present

## 2017-07-17 MED ORDER — ASPIRIN EC 81 MG PO TBEC: 81.0000 mg | DELAYED_RELEASE_TABLET | Freq: Every day | ORAL | 3 refills | Status: DC

## 2017-07-17 NOTE — Progress Notes (Signed)
Office Visit    Patient Name: Erika [name] Date of Encounter: 07/17/2017  Primary Care Provider:  Lucille Passy, MD Primary Cardiologist:  Jerilynn Mages. Fletcher Anon, MD   Chief Complaint    46 year old female with a prior history of CAD status post coronary artery bypass grafting, carotid arterial disease status post right carotid endarterectomy, diastolic dysfunction, hypertension, hyperlipidemia, obesity, diabetes, and tobacco abuse, who presents for follow-up related to labile hypertension.  Past Medical History    Past Medical History:  Diagnosis Date  . Arthralgia of temporomandibular joint   . CAD, multiple vessel    a. cath 06/29/16: ostLM 40%, ostLAD 40%, pLAD 95%, ost-pLCx 60%, pLCx 95%, mLCx 60%, mRCA 95%, D2 50%, LVSF nl;  b. 07/2016 CABG x 4 (LIMA->LAD, VG->Diag, VG->OM, VG->RCA); c. 08/2016 Cath: 3VD w/ 4/4 patent grafts. LAD distal to LIMA has diff dzs->Med rx.  . Carotid arterial disease ([place])    a. 07/2016 s/p R CEA.  . Depression   . Diastolic dysfunction    a. echo 06/28/16: EF 50-55%, mild inf wall HK, GR1DD, mild MR, RV sys fxn nl, mildly dilated LA, PASP nl  . Fatty liver disease, nonalcoholic 7412  . HLD (hyperlipidemia)   . Labile hypertension    a. prior renal ngiogram negative for RAS in 03/2016; b. catecholamines and metanephrines normal, mildly elevated renin with normal aldosterone and normal ratio in 02/2016  . Obesity   . PTSD (post-traumatic stress disorder)   . Tobacco abuse    a. 2018 - cut back from 2 ppd to 0.5 ppd.  . Type 2 diabetes mellitus [name]) January 2017   Past Surgical History:  Procedure Laterality Date  . CARDIAC CATHETERIZATION [name] 06/29/2016   Procedure: Left Heart Cath and Coronary Angiography;  Surgeon: Minna Merritts, MD;  Location: [place] [facility];  Service: Cardiovascular;  Laterality: [name];  . CARDIAC CATHETERIZATION [name] 08/29/2016   Procedure: Left Heart Cath and Cors/Grafts Angiography;  Surgeon: Wellington Hampshire, MD;  Location: [place] [facility];  Service: Cardiovascular;  Laterality: [name];  . CESAREAN SECTION    . CHOLECYSTECTOMY    . CORONARY ARTERY BYPASS GRAFT [name] 07/06/2016   Procedure: CORONARY ARTERY BYPASS GRAFTING (CABG) x four, using left internal mammary artery and right leg greater saphenous vein harvested endoscopically;  Surgeon: Ivin Poot, MD;  Location: [place];  Service: Open Heart Surgery;  Laterality: [name];  . ENDARTERECTOMY Right 07/06/2016   Procedure: ENDARTERECTOMY CAROTID;  Surgeon: Rosetta Posner, MD;  Location: [place];  Service: Vascular;  Laterality: Right;  . PERIPHERAL VASCULAR CATHETERIZATION [name] 04/18/2016   Procedure: Renal Angiography;  Surgeon: Wellington Hampshire, MD;  Location: [place] [facility];  Service: Cardiovascular;  Laterality: [name];  . TEE WITHOUT CARDIOVERSION [name] 07/06/2016   Procedure: TRANSESOPHAGEAL ECHOCARDIOGRAM (TEE);  Surgeon: Ivin Poot, MD;  Location: [place];  Service: Open Heart Surgery;  Laterality: [name];  . TONSILLECTOMY      Allergies  Allergies  Allergen Reactions  . No Known Allergies     History of Present Illness    46 year old female with the above complex past medical history. She has a long history of hypertension with previous normal renal arteries by angiography in April 2017. She subsequent suffered a non-STEMI in July 2017 and was found to have severe multivessel coronary artery disease as well as right carotid disease. She underwent successful coronary artery bypass grafting 4 as well as right carotid endarterectomy at that time.  She required relook catheterization September 2017 related to recurrent chest discomfort. This showed 4 of 4 patent grafts with diffuse small vessel, distal LAD disease after the anastomosis of the left internal mammary artery. Medical therapy was recommended. She was last seen in clinic in June, at which time she was doing well from a cardiac standpoint however noted labile blood pressures with fairly wide range. She reported  feeling poorly when pressures were "normal."   As a result, she will sometimes hold medications at home if pressures getting too close to a systolic of approximately 120. Though she was initially advised to hold spironolactone, this was subsequently continued by primary care, as pressures at home were typically running in the 160s by patient report. She is currently taking carvedilol 12.5 mg twice a day, isosorbide mononitrate 30 mg daily, and spironolactone 25 mg daily. She has not yet taken her medications this morning.  Her blood pressure is 160/82, and she says this is pretty typical for her. She feels best when pressures are between 140-160. Though these pressures are typical for her, she will occasionally have a pressure that spikes over 200. She continues to tinker with her medications at home some, or at least the timing of those medications based on blood pressures throughout the day. She has not been having any chest pain or dyspnea. She is trying to quit smoking and has cut back from 2 packs a day to half a pack a day. She is concerned that if she were to just set quit date and not quit by that date, she would become depressed and only smoked more. She denies PND, orthopnea, dizziness, syncope, edema, or early satiety.  Home Medications    Prior to Admission medications   Medication Sig Start Date End Date Taking? Authorizing Provider  amoxicillin-clavulanate (AUGMENTIN) 875-125 MG tablet Take 1 tablet by mouth 2 (two) times daily. 07/04/17 07/18/17 Yes Lucille Passy, MD  atorvastatin (LIPITOR) 80 MG tablet Take 1 tablet (80 mg total) by mouth daily at 6 PM. 12/03/16  Yes Lucille Passy, MD  carvedilol (COREG) 6.25 MG tablet Take 2 tablets (12.5 mg total) by mouth 2 (two) times daily. 05/23/17  Yes Wellington Hampshire, MD  chlorproMAZINE (THORAZINE) 25 MG tablet Take 1 tablet (25 mg total) by mouth at bedtime. 05/29/17  Yes Arfeen, Arlyce Harman, MD  clonazePAM (KLONOPIN) 0.5 MG tablet Take 1 tablet (0.5 mg  total) by mouth 3 (three) times daily as needed for anxiety. 05/29/17  Yes Arfeen, Arlyce Harman, MD  FLUoxetine HCl 60 MG TABS Take 60 mg by mouth daily. 05/29/17  Yes Arfeen, Arlyce Harman, MD  HYDROcodone-acetaminophen (NORCO) 7.5-325 MG tablet Take 1 tablet by mouth every 6 (six) hours as needed for moderate pain. 06/20/17  Yes Lucille Passy, MD  insulin glargine (LANTUS) 100 UNIT/ML injection Inject 0.2 mLs (20 Units total) into the skin at bedtime. 05/28/17  Yes Lucille Passy, MD  insulin NPH-regular Human (NOVOLIN 70/30) (70-30) 100 UNIT/ML injection Take 5 UNITs twice a day and follow with Primary MD 05/20/17  Yes Nita Sells, MD  Insulin Syringe-Needle U-100 29G X 1/2" 0.3 ML MISC 1 each by Does not apply route 2 (two) times daily. 05/20/17  Yes Nita Sells, MD  isosorbide mononitrate (IMDUR) 30 MG 24 hr tablet TAKE ONE TABLET BY MOUTH ONCE DAILY 06/17/17  Yes Rogelia Mire, NP  lamoTRIgine (LAMICTAL) 200 MG tablet Take 1 tablet (200 mg total) by mouth daily. 05/29/17  Yes Arfeen,  Arlyce Harman, MD  magic mouthwash w/lidocaine SOLN Take 5 mLs by mouth 3 (three) times daily as needed for mouth pain. 07/05/17  Yes Lucille Passy, MD  nitroGLYCERIN (NITROSTAT) 0.4 MG SL tablet Place 1 tablet (0.4 mg total) under the tongue every 5 (five) minutes as needed for chest pain. 08/30/16  Yes Florencia Reasons, MD  norethindrone-ethinyl estradiol (LOESTRIN 1/20, 21,) 1-20 MG-MCG tablet Take 1 tablet by mouth daily. 07/02/17  Yes Lucille Passy, MD  ondansetron (ZOFRAN) 4 MG tablet Take 1 tablet (4 mg total) by mouth daily as needed. 06/22/17  Yes Orbie Pyo, MD  spironolactone (ALDACTONE) 25 MG tablet Take 1 tablet (25 mg total) by mouth daily. 05/31/17 08/29/17 Yes Wellington Hampshire, MD  aspirin EC 81 MG tablet Take 1 tablet (81 mg total) by mouth daily. 07/17/17   Rogelia Mire, NP    Review of Systems    Though bp's have been variable, she has been feeling reasonably well from a cardiac standpoint. She  denies chest pain, dyspnea, palpitations, PND, orthopnea, dizziness, syncope, edema, or early satiety.  All other systems reviewed and are otherwise negative except as noted above.  Physical Exam    VS:  BP (!) 160/82 (BP Location: Left Arm, Patient Position: Sitting, Cuff Size: Normal)   Pulse 91   Ht 5\' 7"  (1.702 m)   Wt 225 lb 4 oz (102.2 kg)   LMP 06/20/2017   BMI 35.28 kg/m  , BMI Body mass index is 35.28 kg/m. GEN: Well nourished, well developed, in no acute distress.  HEENT: normal.  Neck: Supple, no JVD, carotid bruits, or masses. Cardiac: RRR, no murmurs, rubs, or gallops. No clubbing, cyanosis, edema.  Radials/DP/PT 2+ and equal bilaterally.  Respiratory:  Respirations regular and unlabored, clear to auscultation bilaterally. GI: Soft, nontender, nondistended, BS + x 4. MS: no deformity or atrophy. Skin: warm and dry, no rash. Neuro:  Strength and sensation are intact. Psych: Normal affect.  Accessory Clinical Findings    ECG - Regular sinus rhythm, 91, mild inferolateral ST depression, no acute changes.  Assessment & Plan    1.  Coronary artery disease: Status post four-vessel CABG in July 2017. Patient has been doing well without chest pain. She remains on aspirin, beta blocker, nitrate, and statin therapy.  2. Hypertensive heart disease/labile hypertension: Blood pressure remains variable at home. She continues to say that she feels best when pressures are somewhere above 140, and thinks that attempts to lower blood pressure in the past were perhaps too aggressive. We did discuss today that ultimately, we would like to achieve a more normal blood pressure with systolics of less than 099, though in her case, we will have to do this in a very incremental fashion as she does seem to be very sensitive to medication changes. She has not taken her medications this morning and her blood pressure is 160/82. She plans to take her carvedilol and spironolactone when she gets home and  then based on blood pressure reading, she will consider taking her isosorbide. This is typically how she manages her blood pressure and blood pressure medications. I will not make any changes to her medicines today and instead have asked her to send [name] a weeks worth of blood pressure recordings, ideally taken about 2 hours after taking her morning medications, so that we can make a more informed decision about her blood pressure trends and where there might be opportunities to improve her blood pressure. With resting  heart rate in the 90s, I suspect that she might tolerate a little bit more carvedilol, but again, I like to see more data from her. She is in agreement with this plan.  3. Hyperlipidemia: LDL was 64 last August. Continue high potency statin therapy.  4. Ongoing tobacco abuse: Patient is cut back from 2 packs a day to half a pack a day. She does want to quit but doesn't have a goal date. We discussed how relapse is a part of the cessation process and should not be perceived as failure. I did encourage her to set a quit date. She has tried nicotine patches in the past and did not think that they were effective and is therefore not interested in trying again.  5. Carotid arterial disease: Status post right carotid endarterectomy. Carotid ultrasound in February shows stable right internal carotid arterial disease.  6. Type 2 diabetes mellitus: A1c was 11.0 in May. She is working with primary care to improve this.  7. Disposition: Patient will send [name] blood pressure recordings next week. She is primary care follow-up tomorrow. We will arrange for follow-up with Dr. Fletcher Anon in 4-6 months or sooner if necessary.  Murray Hodgkins, NP 07/17/2017, 12:08 PM

## 2017-07-17 NOTE — Patient Instructions (Signed)
Follow-Up: Your physician wants you to follow-up in: 4-6 months with Dr. Fletcher Anon. You will receive a reminder letter in the mail two months in advance. If you don't receive a letter, please call our office to schedule the follow-up appointment.  It was a pleasure seeing you today here in the office. Please do not hesitate to give [name] a call back if you have any further questions. [place], BSN

## 2017-07-18 ENCOUNTER — Ambulatory Visit (INDEPENDENT_AMBULATORY_CARE_PROVIDER_SITE_OTHER): Payer: BLUE CROSS/BLUE SHIELD | Admitting: Family Medicine

## 2017-07-18 ENCOUNTER — Encounter: Payer: Self-pay | Admitting: Family Medicine

## 2017-07-18 VITALS — BP 180/120 | HR 85 | Ht 67.0 in | Wt 224.0 lb

## 2017-07-18 DIAGNOSIS — I10 Essential (primary) hypertension: Secondary | ICD-10-CM | POA: Diagnosis not present

## 2017-07-18 DIAGNOSIS — I214 Non-ST elevation (NSTEMI) myocardial infarction: Secondary | ICD-10-CM

## 2017-07-18 DIAGNOSIS — G44021 Chronic cluster headache, intractable: Secondary | ICD-10-CM

## 2017-07-18 MED ORDER — HYDROCODONE-ACETAMINOPHEN 7.5-325 MG PO TABS: 1.0000 | ORAL_TABLET | Freq: Four times a day (QID) | ORAL | 0 refills | Status: DC | PRN

## 2017-07-18 NOTE — Progress Notes (Signed)
Subjective:   Patient ID: Erika [name], female    DOB: April 06, 1971, 46 y.o.   MRN: [id number]  Erika [name] is a pleasant 46 y.o. year old female who presents to clinic today with Headache and Hypertension  on 07/18/2017  HPI:   HTN- known h/o refractory HTN which has been much better since her CABG and right CEA in 06/2016.  Saw cardiology, Ignacia Bayley yesterday. Note reviewed. He advised keeping a log at home of her labile blood pressures and did not make any changes yesterday.  Headaches are still occurring frequently.Takes norco but tries not to take it. Tramadol did not work.  Feels frustrated that her blood pressures are not being better controlled and leading to worsening headaches.      Current Outpatient Prescriptions on File Prior to Visit  Medication Sig Dispense Refill  . aspirin EC 81 MG tablet Take 1 tablet (81 mg total) by mouth daily. 90 tablet 3  . atorvastatin (LIPITOR) 80 MG tablet Take 1 tablet (80 mg total) by mouth daily at 6 PM. 90 tablet 2  . carvedilol (COREG) 6.25 MG tablet Take 2 tablets (12.5 mg total) by mouth 2 (two) times daily. 60 tablet 6  . chlorproMAZINE (THORAZINE) 25 MG tablet Take 1 tablet (25 mg total) by mouth at bedtime. 30 tablet 2  . clonazePAM (KLONOPIN) 0.5 MG tablet Take 1 tablet (0.5 mg total) by mouth 3 (three) times daily as needed for anxiety. 90 tablet 2  . FLUoxetine HCl 60 MG TABS Take 60 mg by mouth daily. 30 tablet 2  . HYDROcodone-acetaminophen (NORCO) 7.5-325 MG tablet Take 1 tablet by mouth every 6 (six) hours as needed for moderate pain. 60 tablet 0  . insulin glargine (LANTUS) 100 UNIT/ML injection Inject 0.2 mLs (20 Units total) into the skin at bedtime. 10 mL 11  . insulin NPH-regular Human (NOVOLIN 70/30) (70-30) 100 UNIT/ML injection Take 5 UNITs twice a day and follow with Primary MD 10 mL 3  . Insulin Syringe-Needle U-100 29G X 1/2" 0.3 ML MISC 1 each by Does not apply route 2 (two) times daily. 30  each 0  . isosorbide mononitrate (IMDUR) 30 MG 24 hr tablet TAKE ONE TABLET BY MOUTH ONCE DAILY 30 tablet 6  . lamoTRIgine (LAMICTAL) 200 MG tablet Take 1 tablet (200 mg total) by mouth daily. 30 tablet 2  . magic mouthwash w/lidocaine SOLN Take 5 mLs by mouth 3 (three) times daily as needed for mouth pain. 250 mL 0  . nitroGLYCERIN (NITROSTAT) 0.4 MG SL tablet Place 1 tablet (0.4 mg total) under the tongue every 5 (five) minutes as needed for chest pain. 30 tablet 0  . norethindrone-ethinyl estradiol (LOESTRIN 1/20, 21,) 1-20 MG-MCG tablet Take 1 tablet by mouth daily. 1 Package 11  . ondansetron (ZOFRAN) 4 MG tablet Take 1 tablet (4 mg total) by mouth daily as needed. 10 tablet 0  . spironolactone (ALDACTONE) 25 MG tablet Take 1 tablet (25 mg total) by mouth daily. 90 tablet 3   No current facility-administered medications on file prior to visit.     Allergies  Allergen Reactions  . No Known Allergies     Past Medical History:  Diagnosis Date  . Arthralgia of temporomandibular joint   . CAD, multiple vessel    a. cath 06/29/16: ostLM 40%, ostLAD 40%, pLAD 95%, ost-pLCx 60%, pLCx 95%, mLCx 60%, mRCA 95%, D2 50%, LVSF nl;  b. 07/2016 CABG x 4 (LIMA->LAD, VG->Diag, VG->OM, VG->RCA); c. 08/2016  Cath: 3VD w/ 4/4 patent grafts. LAD distal to LIMA has diff dzs->Med rx.  . Carotid arterial disease ([place])    a. 07/2016 s/p R CEA.  . Depression   . Diastolic dysfunction    a. echo 06/28/16: EF 50-55%, mild inf wall HK, GR1DD, mild MR, RV sys fxn nl, mildly dilated LA, PASP nl  . Fatty liver disease, nonalcoholic 8413  . HLD (hyperlipidemia)   . Labile hypertension    a. prior renal ngiogram negative for RAS in 03/2016; b. catecholamines and metanephrines normal, mildly elevated renin with normal aldosterone and normal ratio in 02/2016  . Obesity   . PTSD (post-traumatic stress disorder)   . Tobacco abuse    a. 2018 - cut back from 2 ppd to 0.5 ppd.  . Type 2 diabetes mellitus [facility]) January 2017     Past Surgical History:  Procedure Laterality Date  . CARDIAC CATHETERIZATION [name] 06/29/2016   Procedure: Left Heart Cath and Coronary Angiography;  Surgeon: Minna Merritts, MD;  Location: [place] [facility];  Service: Cardiovascular;  Laterality: [name];  . CARDIAC CATHETERIZATION [name] 08/29/2016   Procedure: Left Heart Cath and Cors/Grafts Angiography;  Surgeon: Wellington Hampshire, MD;  Location: [place] [facility];  Service: Cardiovascular;  Laterality: [name];  . CESAREAN SECTION    . CHOLECYSTECTOMY    . CORONARY ARTERY BYPASS GRAFT [name] 07/06/2016   Procedure: CORONARY ARTERY BYPASS GRAFTING (CABG) x four, using left internal mammary artery and right leg greater saphenous vein harvested endoscopically;  Surgeon: Ivin Poot, MD;  Location: [place];  Service: Open Heart Surgery;  Laterality: [name];  . ENDARTERECTOMY Right 07/06/2016   Procedure: ENDARTERECTOMY CAROTID;  Surgeon: Rosetta Posner, MD;  Location: [place];  Service: Vascular;  Laterality: Right;  . PERIPHERAL VASCULAR CATHETERIZATION [name] 04/18/2016   Procedure: Renal Angiography;  Surgeon: Wellington Hampshire, MD;  Location: [place] [facility];  Service: Cardiovascular;  Laterality: [name];  . TEE WITHOUT CARDIOVERSION [name] 07/06/2016   Procedure: TRANSESOPHAGEAL ECHOCARDIOGRAM (TEE);  Surgeon: Ivin Poot, MD;  Location: [place];  Service: Open Heart Surgery;  Laterality: [name];  . TONSILLECTOMY      Family History  Problem Relation Age of Onset  . Adopted: Yes  . Diabetes Mother   . Diabetes Father   . Alcohol abuse Father   . Heart disease Father   . Drug abuse Father   . Stroke Sister   . Anxiety disorder Sister     Social History   Social History  . Marital status: Married    Spouse name: [name]  . Number of children: [name]  . Years of education: [name]   Occupational History  . Not on file.   Social History Main Topics  . Smoking status: Current Every Day Smoker    Packs/day: 0.25    Types: Cigarettes    Last attempt to quit:  05/23/2017  . Smokeless tobacco: Never Used     Comment: patient smokes when she drives.   . Alcohol use No  . Drug use: No  . Sexual activity: Yes    Partners: Male    Birth control/ protection: None   Other Topics Concern  . Not on file   Social History Narrative  . No narrative on file   The PMH, PSH, Social History, Family History, Medications, and allergies have been reviewed in [facility], and have been updated if relevant.   Review of Systems  Gastrointestinal: Negative.   Neurological: Positive for  headaches. Negative for dizziness, tremors, seizures, syncope, facial asymmetry, speech difficulty, weakness, light-headedness and numbness.  All other systems reviewed and are negative.      Objective:    BP (!) 180/120   Pulse 85   Ht 5\' 7"  (1.702 m)   Wt 224 lb (101.6 kg)   LMP 06/20/2017   SpO2 98%   BMI 35.08 kg/m   BP Readings from Last 3 Encounters:  07/18/17 (!) 180/120  07/17/17 (!) 160/82  06/22/17 (!) 162/86     Physical Exam  Constitutional: She is oriented to person, place, and time. She appears well-developed and well-nourished. No distress.  HENT:  Head: Normocephalic and atraumatic.  Eyes: Conjunctivae are normal.  Cardiovascular: Normal rate.   Pulmonary/Chest: Effort normal.  Musculoskeletal: Normal range of motion. She exhibits no edema.  Neurological: She is alert and oriented to person, place, and time. She displays normal reflexes. No cranial nerve deficit. Coordination normal.  Skin: Skin is warm and dry. She is not diaphoretic.  Psychiatric: She has a normal mood and affect. Her behavior is normal. Judgment and thought content normal.  Nursing note and vitals reviewed.         Assessment & Plan:   Essential hypertension, malignant  Intractable chronic cluster headache No Follow-up on file.

## 2017-07-18 NOTE — Assessment & Plan Note (Signed)
Still requiring norco. Rx printed and given to pt.

## 2017-07-18 NOTE — Assessment & Plan Note (Signed)
Complicated by severe cluster headaches, CAD and labile BPs.  We spoke at length about where to go from this point.  She would like a second opinion from another cardiology group and I think this is reasonable. Referral placed.

## 2017-07-31 DIAGNOSIS — H52223 Regular astigmatism, bilateral: Secondary | ICD-10-CM | POA: Diagnosis not present

## 2017-07-31 DIAGNOSIS — H5213 Myopia, bilateral: Secondary | ICD-10-CM | POA: Diagnosis not present

## 2017-07-31 DIAGNOSIS — H524 Presbyopia: Secondary | ICD-10-CM | POA: Diagnosis not present

## 2017-08-05 DIAGNOSIS — G44009 Cluster headache syndrome, unspecified, not intractable: Secondary | ICD-10-CM | POA: Diagnosis not present

## 2017-08-09 DIAGNOSIS — M9903 Segmental and somatic dysfunction of lumbar region: Secondary | ICD-10-CM | POA: Diagnosis not present

## 2017-08-09 DIAGNOSIS — M9901 Segmental and somatic dysfunction of cervical region: Secondary | ICD-10-CM | POA: Diagnosis not present

## 2017-08-09 DIAGNOSIS — M542 Cervicalgia: Secondary | ICD-10-CM | POA: Diagnosis not present

## 2017-08-09 DIAGNOSIS — M545 Low back pain: Secondary | ICD-10-CM | POA: Diagnosis not present

## 2017-08-15 ENCOUNTER — Ambulatory Visit (INDEPENDENT_AMBULATORY_CARE_PROVIDER_SITE_OTHER): Payer: BLUE CROSS/BLUE SHIELD | Admitting: Family Medicine

## 2017-08-15 ENCOUNTER — Encounter: Payer: Self-pay | Admitting: Family Medicine

## 2017-08-15 VITALS — BP 156/96 | HR 95 | Temp 98.1°F | Resp 16 | Ht 67.0 in | Wt 221.0 lb

## 2017-08-15 DIAGNOSIS — Z951 Presence of aortocoronary bypass graft: Secondary | ICD-10-CM

## 2017-08-15 DIAGNOSIS — G44021 Chronic cluster headache, intractable: Secondary | ICD-10-CM | POA: Diagnosis not present

## 2017-08-15 DIAGNOSIS — I10 Essential (primary) hypertension: Secondary | ICD-10-CM

## 2017-08-15 DIAGNOSIS — E1169 Type 2 diabetes mellitus with other specified complication: Secondary | ICD-10-CM

## 2017-08-15 LAB — COMPREHENSIVE METABOLIC PANEL
ALBUMIN: 4.1 g/dL (ref 3.5–5.2)
ALK PHOS: 63 U/L (ref 39–117)
ALT: 22 U/L (ref 0–35)
AST: 15 U/L (ref 0–37)
BUN: 11 mg/dL (ref 6–23)
CO2: 27 mEq/L (ref 19–32)
Calcium: 10.2 mg/dL (ref 8.4–10.5)
Chloride: 99 mEq/L (ref 96–112)
Creatinine, Ser: 0.69 mg/dL (ref 0.40–1.20)
GFR: 98.37 mL/min (ref >60.00)
Glucose, Bld: 233 mg/dL — ABNORMAL HIGH (ref 70–99)
Potassium: 4 mEq/L (ref 3.5–5.1)
Sodium: 133 mEq/L — ABNORMAL LOW (ref 135–145)
TOTAL PROTEIN: 7.4 g/dL (ref 6.0–8.3)
Total Bilirubin: 0.4 mg/dL (ref 0.2–1.2)

## 2017-08-15 LAB — MICROALBUMIN / CREATININE URINE RATIO
Creatinine,U: 251.7 mg/dL
MICROALB UR: 21.2 mg/dL — AB (ref 0.0–1.9)
MICROALB/CREAT RATIO: 8.4 mg/g (ref 0.0–30.0)

## 2017-08-15 LAB — HEMOGLOBIN A1C: Hgb A1c MFr Bld: 8.4 % — ABNORMAL HIGH (ref 4.6–6.5)

## 2017-08-15 MED ORDER — HYDROCODONE-ACETAMINOPHEN 7.5-325 MG PO TABS: 1.0000 | ORAL_TABLET | Freq: Four times a day (QID) | ORAL | 0 refills | Status: DC | PRN

## 2017-08-15 NOTE — Assessment & Plan Note (Signed)
Continue current rx. Check a1c, urine micro today. Foot exam today.

## 2017-08-15 NOTE — Progress Notes (Signed)
Subjective:   Patient ID: Erika [name], female    DOB: 1971-03-24, 46 y.o.   MRN: [id number]  Erika [name] is a pleasant 46 y.o. year old female who presents to clinic today with Headache; Hypertension; and Diabetes  on 08/15/2017  HPI:   HTN- known h/o refractory HTN which has been much better since her CABG and right CEA in 06/2016. Relatively well controlled today.  DM- deteriorated in 04/2017- was admitted in DKA. Added Lantus to her rx.  Feels it is much better controlled.  Does not check FSBS daily. Denies any episodes of hypoglycemia. Lab Results  Component Value Date   HGBA1C 11.0 (H) 05/19/2017    Cluster headaches- "about the same."  Tries not to take Norco unless she needs it. No longer using home O2.    Current Outpatient Prescriptions on File Prior to Visit  Medication Sig Dispense Refill  . aspirin EC 81 MG tablet Take 1 tablet (81 mg total) by mouth daily. 90 tablet 3  . atorvastatin (LIPITOR) 80 MG tablet Take 1 tablet (80 mg total) by mouth daily at 6 PM. 90 tablet 2  . carvedilol (COREG) 6.25 MG tablet Take 2 tablets (12.5 mg total) by mouth 2 (two) times daily. 60 tablet 6  . chlorproMAZINE (THORAZINE) 25 MG tablet Take 1 tablet (25 mg total) by mouth at bedtime. 30 tablet 2  . clonazePAM (KLONOPIN) 0.5 MG tablet Take 1 tablet (0.5 mg total) by mouth 3 (three) times daily as needed for anxiety. 90 tablet 2  . FLUoxetine HCl 60 MG TABS Take 60 mg by mouth daily. 30 tablet 2  . HYDROcodone-acetaminophen (NORCO) 7.5-325 MG tablet Take 1 tablet by mouth every 6 (six) hours as needed for moderate pain. 60 tablet 0  . insulin glargine (LANTUS) 100 UNIT/ML injection Inject 0.2 mLs (20 Units total) into the skin at bedtime. 10 mL 11  . insulin NPH-regular Human (NOVOLIN 70/30) (70-30) 100 UNIT/ML injection Take 5 UNITs twice a day and follow with Primary MD 10 mL 3  . Insulin Syringe-Needle U-100 29G X 1/2" 0.3 ML MISC 1 each by Does not apply route 2  (two) times daily. 30 each 0  . isosorbide mononitrate (IMDUR) 30 MG 24 hr tablet TAKE ONE TABLET BY MOUTH ONCE DAILY 30 tablet 6  . lamoTRIgine (LAMICTAL) 200 MG tablet Take 1 tablet (200 mg total) by mouth daily. 30 tablet 2  . magic mouthwash w/lidocaine SOLN Take 5 mLs by mouth 3 (three) times daily as needed for mouth pain. 250 mL 0  . nitroGLYCERIN (NITROSTAT) 0.4 MG SL tablet Place 1 tablet (0.4 mg total) under the tongue every 5 (five) minutes as needed for chest pain. 30 tablet 0  . norethindrone-ethinyl estradiol (LOESTRIN 1/20, 21,) 1-20 MG-MCG tablet Take 1 tablet by mouth daily. 1 Package 11  . ondansetron (ZOFRAN) 4 MG tablet Take 1 tablet (4 mg total) by mouth daily as needed. 10 tablet 0  . spironolactone (ALDACTONE) 25 MG tablet Take 1 tablet (25 mg total) by mouth daily. 90 tablet 3   No current facility-administered medications on file prior to visit.     Allergies  Allergen Reactions  . No Known Allergies     Past Medical History:  Diagnosis Date  . Arthralgia of temporomandibular joint   . CAD, multiple vessel    a. cath 06/29/16: ostLM 40%, ostLAD 40%, pLAD 95%, ost-pLCx 60%, pLCx 95%, mLCx 60%, mRCA 95%, D2 50%, LVSF nl;  b. 07/2016  CABG x 4 (LIMA->LAD, VG->Diag, VG->OM, VG->RCA); c. 08/2016 Cath: 3VD w/ 4/4 patent grafts. LAD distal to LIMA has diff dzs->Med rx.  . Carotid arterial disease ([place])    a. 07/2016 s/p R CEA.  . Depression   . Diastolic dysfunction    a. echo 06/28/16: EF 50-55%, mild inf wall HK, GR1DD, mild MR, RV sys fxn nl, mildly dilated LA, PASP nl  . Fatty liver disease, nonalcoholic 4650  . HLD (hyperlipidemia)   . Labile hypertension    a. prior renal ngiogram negative for RAS in 03/2016; b. catecholamines and metanephrines normal, mildly elevated renin with normal aldosterone and normal ratio in 02/2016  . Obesity   . PTSD (post-traumatic stress disorder)   . Tobacco abuse    a. 2018 - cut back from 2 ppd to 0.5 ppd.  . Type 2 diabetes mellitus  [facility]) January 2017    Past Surgical History:  Procedure Laterality Date  . CARDIAC CATHETERIZATION [name] 06/29/2016   Procedure: Left Heart Cath and Coronary Angiography;  Surgeon: Minna Merritts, MD;  Location: [place] [facility];  Service: Cardiovascular;  Laterality: [name];  . CARDIAC CATHETERIZATION [name] 08/29/2016   Procedure: Left Heart Cath and Cors/Grafts Angiography;  Surgeon: Wellington Hampshire, MD;  Location: [place] [facility];  Service: Cardiovascular;  Laterality: [name];  . CESAREAN SECTION    . CHOLECYSTECTOMY    . CORONARY ARTERY BYPASS GRAFT [name] 07/06/2016   Procedure: CORONARY ARTERY BYPASS GRAFTING (CABG) x four, using left internal mammary artery and right leg greater saphenous vein harvested endoscopically;  Surgeon: Ivin Poot, MD;  Location: [place];  Service: Open Heart Surgery;  Laterality: [name];  . ENDARTERECTOMY Right 07/06/2016   Procedure: ENDARTERECTOMY CAROTID;  Surgeon: Rosetta Posner, MD;  Location: [place];  Service: Vascular;  Laterality: Right;  . PERIPHERAL VASCULAR CATHETERIZATION [name] 04/18/2016   Procedure: Renal Angiography;  Surgeon: Wellington Hampshire, MD;  Location: [place] [facility];  Service: Cardiovascular;  Laterality: [name];  . TEE WITHOUT CARDIOVERSION [name] 07/06/2016   Procedure: TRANSESOPHAGEAL ECHOCARDIOGRAM (TEE);  Surgeon: Ivin Poot, MD;  Location: [place];  Service: Open Heart Surgery;  Laterality: [name];  . TONSILLECTOMY      Family History  Problem Relation Age of Onset  . Adopted: Yes  . Diabetes Mother   . Diabetes Father   . Alcohol abuse Father   . Heart disease Father   . Drug abuse Father   . Stroke Sister   . Anxiety disorder Sister     Social History   Social History  . Marital status: Married    Spouse name: [name]  . Number of children: [name]  . Years of education: [name]   Occupational History  . Not on file.   Social History Main Topics  . Smoking status: Current Every Day Smoker    Packs/day: 0.25    Types: Cigarettes     Last attempt to quit: 05/23/2017  . Smokeless tobacco: Never Used     Comment: patient smokes when she drives.   . Alcohol use No  . Drug use: No  . Sexual activity: Yes    Partners: Male    Birth control/ protection: None   Other Topics Concern  . Not on file   Social History Narrative  . No narrative on file   The PMH, PSH, Social History, Family History, Medications, and allergies have been reviewed in [facility], and have been updated if relevant.   Review of  Systems  Respiratory: Negative.   Cardiovascular: Negative.   Gastrointestinal: Negative.   Endocrine: Negative.   Neurological: Positive for headaches. Negative for dizziness, tremors, seizures, syncope, facial asymmetry, speech difficulty, weakness, light-headedness and numbness.  All other systems reviewed and are negative.      Objective:    BP (!) 156/96   Pulse 95   Temp 98.1 F (36.7 C) (Oral)   Resp 16   Ht 5\' 7"  (1.702 m)   Wt 221 lb (100.2 kg)   SpO2 97%   BMI 34.61 kg/m   BP Readings from Last 3 Encounters:  08/15/17 (!) 156/96  07/18/17 (!) 180/120  07/17/17 (!) 160/82     Physical Exam  Constitutional: She is oriented to person, place, and time. She appears well-developed and well-nourished. No distress.  HENT:  Head: Normocephalic and atraumatic.  Eyes: Conjunctivae are normal.  Cardiovascular: Normal rate and regular rhythm.   Pulmonary/Chest: Effort normal.  Musculoskeletal: Normal range of motion. She exhibits no edema.  Neurological: She is alert and oriented to person, place, and time. She displays normal reflexes. No cranial nerve deficit. Coordination normal.  Skin: Skin is warm and dry. She is not diaphoretic.  Psychiatric: She has a normal mood and affect. Her behavior is normal. Judgment and thought content normal.  Nursing note and vitals reviewed.         Assessment & Plan:   Essential hypertension, malignant  Type 2 diabetes mellitus with other specified complication,  without long-term current use of insulin (HCC)  S/P CABG x 4 No Follow-up on file.

## 2017-08-15 NOTE — Assessment & Plan Note (Signed)
Managed by cardiology, I attempted to refer to another cardiologist for a second opinion, per pt request, but the referral was denied.

## 2017-08-15 NOTE — Assessment & Plan Note (Signed)
Still requiring as needed norco. Rx printed and given to pt today.

## 2017-08-26 ENCOUNTER — Other Ambulatory Visit: Payer: Self-pay | Admitting: Family Medicine

## 2017-08-29 ENCOUNTER — Ambulatory Visit (INDEPENDENT_AMBULATORY_CARE_PROVIDER_SITE_OTHER): Payer: BLUE CROSS/BLUE SHIELD | Admitting: Psychiatry

## 2017-08-29 ENCOUNTER — Encounter (HOSPITAL_COMMUNITY): Payer: Self-pay | Admitting: Psychiatry

## 2017-08-29 VITALS — BP 116/77 | HR 91 | Ht 67.0 in | Wt 220.4 lb

## 2017-08-29 DIAGNOSIS — F515 Nightmare disorder: Secondary | ICD-10-CM | POA: Diagnosis not present

## 2017-08-29 DIAGNOSIS — Z811 Family history of alcohol abuse and dependence: Secondary | ICD-10-CM | POA: Diagnosis not present

## 2017-08-29 DIAGNOSIS — F4310 Post-traumatic stress disorder, unspecified: Secondary | ICD-10-CM

## 2017-08-29 DIAGNOSIS — R51 Headache: Secondary | ICD-10-CM

## 2017-08-29 DIAGNOSIS — Z818 Family history of other mental and behavioral disorders: Secondary | ICD-10-CM

## 2017-08-29 DIAGNOSIS — Z91410 Personal history of adult physical and sexual abuse: Secondary | ICD-10-CM

## 2017-08-29 DIAGNOSIS — F419 Anxiety disorder, unspecified: Secondary | ICD-10-CM | POA: Diagnosis not present

## 2017-08-29 DIAGNOSIS — F17210 Nicotine dependence, cigarettes, uncomplicated: Secondary | ICD-10-CM

## 2017-08-29 DIAGNOSIS — G4700 Insomnia, unspecified: Secondary | ICD-10-CM | POA: Diagnosis not present

## 2017-08-29 DIAGNOSIS — F331 Major depressive disorder, recurrent, moderate: Secondary | ICD-10-CM | POA: Diagnosis not present

## 2017-08-29 DIAGNOSIS — Z813 Family history of other psychoactive substance abuse and dependence: Secondary | ICD-10-CM | POA: Diagnosis not present

## 2017-08-29 MED ORDER — LAMOTRIGINE 200 MG PO TABS: 200.0000 mg | ORAL_TABLET | Freq: Every day | ORAL | 2 refills | Status: DC

## 2017-08-29 MED ORDER — FLUOXETINE HCL 60 MG PO TABS: 60.0000 mg | ORAL_TABLET | Freq: Every day | ORAL | 2 refills | Status: DC

## 2017-08-29 MED ORDER — BENZTROPINE MESYLATE 0.5 MG PO TABS: 0.5000 mg | ORAL_TABLET | Freq: Every day | ORAL | 2 refills | Status: DC

## 2017-08-29 MED ORDER — CLONAZEPAM 0.5 MG PO TABS: 0.5000 mg | ORAL_TABLET | Freq: Three times a day (TID) | ORAL | 2 refills | Status: DC | PRN

## 2017-08-29 MED ORDER — CHLORPROMAZINE HCL 25 MG PO TABS: 25.0000 mg | ORAL_TABLET | Freq: Every day | ORAL | 2 refills | Status: DC
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
## 2017-09-04 DIAGNOSIS — M542 Cervicalgia: Secondary | ICD-10-CM | POA: Diagnosis not present

## 2017-09-04 DIAGNOSIS — M9903 Segmental and somatic dysfunction of lumbar region: Secondary | ICD-10-CM | POA: Diagnosis not present

## 2017-09-04 DIAGNOSIS — M545 Low back pain: Secondary | ICD-10-CM | POA: Diagnosis not present

## 2017-09-04 DIAGNOSIS — M9901 Segmental and somatic dysfunction of cervical region: Secondary | ICD-10-CM | POA: Diagnosis not present

## 2017-09-05 DIAGNOSIS — G44009 Cluster headache syndrome, unspecified, not intractable: Secondary | ICD-10-CM | POA: Diagnosis not present

## 2017-09-17 ENCOUNTER — Ambulatory Visit (INDEPENDENT_AMBULATORY_CARE_PROVIDER_SITE_OTHER): Payer: BLUE CROSS/BLUE SHIELD | Admitting: Family Medicine

## 2017-09-17 ENCOUNTER — Encounter: Payer: Self-pay | Admitting: Family Medicine

## 2017-09-17 VITALS — BP 140/78 | HR 89 | Temp 98.1°F | Resp 16 | Wt 225.0 lb

## 2017-09-17 DIAGNOSIS — Z23 Encounter for immunization: Secondary | ICD-10-CM

## 2017-09-17 DIAGNOSIS — G44021 Chronic cluster headache, intractable: Secondary | ICD-10-CM

## 2017-09-17 DIAGNOSIS — I10 Essential (primary) hypertension: Secondary | ICD-10-CM

## 2017-09-17 DIAGNOSIS — E1169 Type 2 diabetes mellitus with other specified complication: Secondary | ICD-10-CM

## 2017-09-17 MED ORDER — CYCLOBENZAPRINE HCL 7.5 MG PO TABS: 7.5000 mg | ORAL_TABLET | Freq: Three times a day (TID) | ORAL | 0 refills | Status: DC | PRN

## 2017-09-17 MED ORDER — HYDROCODONE-ACETAMINOPHEN 7.5-325 MG PO TABS: 1.0000 | ORAL_TABLET | Freq: Four times a day (QID) | ORAL | 0 refills | Status: DC | PRN

## 2017-09-17 NOTE — Assessment & Plan Note (Signed)
Rx for norco printed and given to pt.

## 2017-09-17 NOTE — Assessment & Plan Note (Signed)
Actually under reasonable control today. No changes made.

## 2017-09-17 NOTE — Progress Notes (Signed)
Subjective:   Patient ID: Erika [name], female    DOB: [birth year], 46 y.o.   MRN: [id number]  Erika [name] is a pleasant 46 y.o. year old female who presents to clinic today with Follow-up  on 09/17/2017  HPI:   HTN- known h/o refractory HTN which has been much better since her CABG and right CEA in 06/2016. Relatively well controlled today.   Cluster headaches-  Has not noticed much improvement but her only daughter is in the hospital with DKA.  Stress impacts her headaches.  Tries not to take Norco unless she needs it. No longer using home O2.    Current Outpatient Prescriptions on File Prior to Visit  Medication Sig Dispense Refill  . aspirin EC 81 MG tablet Take 1 tablet (81 mg total) by mouth daily. 90 tablet 3  . atorvastatin (LIPITOR) 80 MG tablet TAKE ONE TABLET BY MOUTH ONCE DAILY AT 6 PM 90 tablet 2  . benztropine (COGENTIN) 0.5 MG tablet Take 1 tablet (0.5 mg total) by mouth at bedtime. 30 tablet 2  . carvedilol (COREG) 6.25 MG tablet Take 2 tablets (12.5 mg total) by mouth 2 (two) times daily. 60 tablet 6  . chlorproMAZINE (THORAZINE) 25 MG tablet Take 1 tablet (25 mg total) by mouth at bedtime. 30 tablet 2  . clonazePAM (KLONOPIN) 0.5 MG tablet Take 1 tablet (0.5 mg total) by mouth 3 (three) times daily as needed for anxiety. 90 tablet 2  . FLUoxetine HCl 60 MG TABS Take 60 mg by mouth daily. 30 tablet 2  . HYDROcodone-acetaminophen (NORCO) 7.5-325 MG tablet Take 1 tablet by mouth every 6 (six) hours as needed for moderate pain. 60 tablet 0  . insulin glargine (LANTUS) 100 UNIT/ML injection Inject 0.2 mLs (20 Units total) into the skin at bedtime. 10 mL 11  . insulin NPH-regular Human (NOVOLIN 70/30) (70-30) 100 UNIT/ML injection Take 5 UNITs twice a day and follow with Primary MD 10 mL 3  . Insulin Syringe-Needle U-100 29G X 1/2" 0.3 ML MISC 1 each by Does not apply route 2 (two) times daily. 30 each 0  . isosorbide mononitrate (IMDUR) 30 MG 24 hr tablet  TAKE ONE TABLET BY MOUTH ONCE DAILY 30 tablet 6  . lamoTRIgine (LAMICTAL) 200 MG tablet Take 1 tablet (200 mg total) by mouth daily. 30 tablet 2  . nitroGLYCERIN (NITROSTAT) 0.4 MG SL tablet Place 1 tablet (0.4 mg total) under the tongue every 5 (five) minutes as needed for chest pain. 30 tablet 0  . ondansetron (ZOFRAN) 4 MG tablet Take 1 tablet (4 mg total) by mouth daily as needed. 10 tablet 0  . spironolactone (ALDACTONE) 25 MG tablet Take 1 tablet (25 mg total) by mouth daily. 90 tablet 3   No current facility-administered medications on file prior to visit.     Allergies  Allergen Reactions  . No Known Allergies     Past Medical History:  Diagnosis Date  . Arthralgia of temporomandibular joint   . CAD, multiple vessel    a. cath 06/29/16: ostLM 40%, ostLAD 40%, pLAD 95%, ost-pLCx 60%, pLCx 95%, mLCx 60%, mRCA 95%, D2 50%, LVSF nl;  b. 07/2016 CABG x 4 (LIMA->LAD, VG->Diag, VG->OM, VG->RCA); c. 08/2016 Cath: 3VD w/ 4/4 patent grafts. LAD distal to LIMA has diff dzs->Med rx.  . Carotid arterial disease ([place])    a. 07/2016 s/p R CEA.  . Depression   . Diastolic dysfunction    a. echo 06/28/16: EF 50-55%, mild  inf wall HK, GR1DD, mild MR, RV sys fxn nl, mildly dilated LA, PASP nl  . Fatty liver disease, nonalcoholic 0354  . HLD (hyperlipidemia)   . Labile hypertension    a. prior renal ngiogram negative for RAS in 03/2016; b. catecholamines and metanephrines normal, mildly elevated renin with normal aldosterone and normal ratio in 02/2016  . Obesity   . PTSD (post-traumatic stress disorder)   . Tobacco abuse    a. 2018 - cut [name] from 2 ppd to 0.5 ppd.  . Type 2 diabetes mellitus [facility]) January 2017    Past Surgical History:  Procedure Laterality Date  . CARDIAC CATHETERIZATION [name] 06/29/2016   Procedure: Left Heart Cath and Coronary Angiography;  Surgeon: Minna Merritts, MD;  Location: [place] [facility];  Service: Cardiovascular;  Laterality: [name];  . CARDIAC CATHETERIZATION [name]  08/29/2016   Procedure: Left Heart Cath and Cors/Grafts Angiography;  Surgeon: Wellington Hampshire, MD;  Location: [place] [facility];  Service: Cardiovascular;  Laterality: [name];  . CESAREAN SECTION    . CHOLECYSTECTOMY    . CORONARY ARTERY BYPASS GRAFT [name] 07/06/2016   Procedure: CORONARY ARTERY BYPASS GRAFTING (CABG) x four, using left internal mammary artery and right leg greater saphenous vein harvested endoscopically;  Surgeon: Ivin Poot, MD;  Location: [place];  Service: Open Heart Surgery;  Laterality: [name];  . ENDARTERECTOMY Right 07/06/2016   Procedure: ENDARTERECTOMY CAROTID;  Surgeon: Rosetta Posner, MD;  Location: [place];  Service: Vascular;  Laterality: Right;  . PERIPHERAL VASCULAR CATHETERIZATION [name] 04/18/2016   Procedure: Renal Angiography;  Surgeon: Wellington Hampshire, MD;  Location: [place] [facility];  Service: Cardiovascular;  Laterality: [name];  . TEE WITHOUT CARDIOVERSION [name] 07/06/2016   Procedure: TRANSESOPHAGEAL ECHOCARDIOGRAM (TEE);  Surgeon: Ivin Poot, MD;  Location: [place];  Service: Open Heart Surgery;  Laterality: [name];  . TONSILLECTOMY      Family History  Problem Relation Age of Onset  . Adopted: Yes  . Diabetes Mother   . Diabetes Father   . Alcohol abuse Father   . Heart disease Father   . Drug abuse Father   . Stroke Sister   . Anxiety disorder Sister     Social History   Social History  . Marital status: Married    Spouse name: [name]  . Number of children: [name]  . Years of education: [name]   Occupational History  . Not on file.   Social History Main Topics  . Smoking status: Current Every Day Smoker    Packs/day: 0.25    Types: Cigarettes    Last attempt to quit: 05/23/2017  . Smokeless tobacco: Never Used     Comment: patient smokes when she drives.   . Alcohol use No  . Drug use: No  . Sexual activity: Yes    Partners: Male    Birth control/ protection: None   Other Topics Concern  . Not on file   Social History Narrative  . No narrative  on file   The PMH, PSH, Social History, Family History, Medications, and allergies have been reviewed in [facility], and have been updated if relevant.   Review of Systems  Respiratory: Negative.   Cardiovascular: Negative.   Gastrointestinal: Negative.   Endocrine: Negative.   Neurological: Positive for headaches. Negative for dizziness, tremors, seizures, syncope, facial asymmetry, speech difficulty, weakness, light-headedness and numbness.  All other systems reviewed and are negative.      Objective:    BP  140/78 (BP Location: Left Arm, Patient Position: Sitting, Cuff Size: Normal)   Pulse 89   Temp 98.1 F (36.7 C) (Oral)   Resp 16   Wt 225 lb (102.1 kg)   SpO2 95%   BMI 35.24 kg/m   BP Readings from Last 3 Encounters:  09/17/17 140/78  08/16/16 (!) 190/106  08/15/17 (!) 156/96     Physical Exam  Constitutional: She is oriented to person, place, and time. She appears well-developed and well-nourished. No distress.  HENT:  Head: Normocephalic and atraumatic.  Eyes: Conjunctivae are normal.  Cardiovascular: Normal rate and regular rhythm.   Pulmonary/Chest: Effort normal.  Musculoskeletal: Normal range of motion. She exhibits no edema.  Neurological: She is alert and oriented to person, place, and time. She displays normal reflexes. No cranial nerve deficit. Coordination normal.  Skin: Skin is warm and dry. She is not diaphoretic.  Psychiatric: She has a normal mood and affect. Her behavior is normal. Judgment and thought content normal.  Nursing note and vitals reviewed.         Assessment & Plan:   Type 2 diabetes mellitus with other specified complication, without long-term current use of insulin (HCC)  HTN (hypertension), malignant  Intractable chronic cluster headache No Follow-up on file.

## 2017-09-19 ENCOUNTER — Encounter: Payer: Self-pay | Admitting: Family Medicine

## 2017-09-20 DIAGNOSIS — M9901 Segmental and somatic dysfunction of cervical region: Secondary | ICD-10-CM | POA: Diagnosis not present

## 2017-09-20 DIAGNOSIS — M9903 Segmental and somatic dysfunction of lumbar region: Secondary | ICD-10-CM | POA: Diagnosis not present

## 2017-09-20 DIAGNOSIS — M542 Cervicalgia: Secondary | ICD-10-CM | POA: Diagnosis not present

## 2017-09-20 DIAGNOSIS — M545 Low back pain: Secondary | ICD-10-CM | POA: Diagnosis not present

## 2017-09-24 ENCOUNTER — Encounter: Payer: Self-pay | Admitting: Family Medicine

## 2017-10-05 DIAGNOSIS — G44009 Cluster headache syndrome, unspecified, not intractable: Secondary | ICD-10-CM | POA: Diagnosis not present

## 2017-10-09 DIAGNOSIS — M9901 Segmental and somatic dysfunction of cervical region: Secondary | ICD-10-CM | POA: Diagnosis not present

## 2017-10-09 DIAGNOSIS — M545 Low back pain: Secondary | ICD-10-CM | POA: Diagnosis not present

## 2017-10-09 DIAGNOSIS — M9903 Segmental and somatic dysfunction of lumbar region: Secondary | ICD-10-CM | POA: Diagnosis not present

## 2017-10-09 DIAGNOSIS — M542 Cervicalgia: Secondary | ICD-10-CM | POA: Diagnosis not present

## 2017-10-17 ENCOUNTER — Encounter: Payer: Self-pay | Admitting: Family Medicine

## 2017-10-17 ENCOUNTER — Ambulatory Visit (INDEPENDENT_AMBULATORY_CARE_PROVIDER_SITE_OTHER): Payer: BLUE CROSS/BLUE SHIELD | Admitting: Family Medicine

## 2017-10-17 VITALS — BP 178/104 | HR 88 | Temp 98.1°F | Ht 67.0 in | Wt 220.8 lb

## 2017-10-17 DIAGNOSIS — G8929 Other chronic pain: Secondary | ICD-10-CM

## 2017-10-17 DIAGNOSIS — G44021 Chronic cluster headache, intractable: Secondary | ICD-10-CM | POA: Diagnosis not present

## 2017-10-17 DIAGNOSIS — I10 Essential (primary) hypertension: Secondary | ICD-10-CM | POA: Diagnosis not present

## 2017-10-17 MED ORDER — CARVEDILOL 6.25 MG PO TABS: 6.2500 mg | ORAL_TABLET | Freq: Two times a day (BID) | ORAL | 6 refills | Status: DC

## 2017-10-17 MED ORDER — HYDROCODONE-ACETAMINOPHEN 7.5-325 MG PO TABS: 1.0000 | ORAL_TABLET | Freq: Four times a day (QID) | ORAL | 0 refills | Status: DC | PRN

## 2017-10-17 NOTE — Assessment & Plan Note (Signed)
See chronic pain management.

## 2017-10-17 NOTE — Assessment & Plan Note (Signed)
Discussed monitoring BP daily and if persistently elevated above 155/90- to take an extra coreg 6.25. She will keep me updated. The patient indicates understanding of these issues and agrees with the plan.

## 2017-10-17 NOTE — Progress Notes (Signed)
Subjective:   Patient ID: Erika [name], female    DOB: 07-05-1971, 46 y.o.   MRN: [id number]  Erika [name] is a pleasant 46 y.o. year old female who presents to clinic today with Follow-up (Patient is here today to F/U with HTN.  She has Cluster H/A's daily.)  on 10/17/2017  HPI:   HTN- known h/o refractory HTN which has been much better since her CABG and right CEA in 06/2016. Not as well controlled today. She just took her rx/   Cluster headaches-  Stress impacts her headaches.  Daughter is having surgery tomorrow.  Tries not to take Norco unless she needs it. No longer using home O2.  Indication for chronic opioid: Cluster HA Medication and dose: Norco 7/5- 325- 1 tab every 6 hours as needed # pills per month: 60 Last UDS date: 06/20/17 Pain contract signed (Y/N): Y Date narcotic database last reviewed (include red flags): 10/17/17   Current Outpatient Prescriptions on File Prior to Visit  Medication Sig Dispense Refill  . aspirin EC 81 MG tablet Take 1 tablet (81 mg total) by mouth daily. 90 tablet 3  . atorvastatin (LIPITOR) 80 MG tablet TAKE ONE TABLET BY MOUTH ONCE DAILY AT 6 PM 90 tablet 2  . benztropine (COGENTIN) 0.5 MG tablet Take 1 tablet (0.5 mg total) by mouth at bedtime. 30 tablet 2  . carvedilol (COREG) 6.25 MG tablet Take 2 tablets (12.5 mg total) by mouth 2 (two) times daily. 60 tablet 6  . chlorproMAZINE (THORAZINE) 25 MG tablet Take 1 tablet (25 mg total) by mouth at bedtime. 30 tablet 2  . clonazePAM (KLONOPIN) 0.5 MG tablet Take 1 tablet (0.5 mg total) by mouth 3 (three) times daily as needed for anxiety. 90 tablet 2  . cyclobenzaprine (FEXMID) 7.5 MG tablet Take 1 tablet (7.5 mg total) by mouth 3 (three) times daily as needed for muscle spasms. 30 tablet 0  . FLUoxetine HCl 60 MG TABS Take 60 mg by mouth daily. 30 tablet 2  . HYDROcodone-acetaminophen (NORCO) 7.5-325 MG tablet Take 1 tablet by mouth every 6 (six) hours as needed for moderate  pain. 60 tablet 0  . insulin glargine (LANTUS) 100 UNIT/ML injection Inject 0.2 mLs (20 Units total) into the skin at bedtime. 10 mL 11  . insulin NPH-regular Human (NOVOLIN 70/30) (70-30) 100 UNIT/ML injection Take 5 UNITs twice a day and follow with Primary MD 10 mL 3  . Insulin Syringe-Needle U-100 29G X 1/2" 0.3 ML MISC 1 each by Does not apply route 2 (two) times daily. 30 each 0  . isosorbide mononitrate (IMDUR) 30 MG 24 hr tablet TAKE ONE TABLET BY MOUTH ONCE DAILY 30 tablet 6  . lamoTRIgine (LAMICTAL) 200 MG tablet Take 1 tablet (200 mg total) by mouth daily. 30 tablet 2  . nitroGLYCERIN (NITROSTAT) 0.4 MG SL tablet Place 1 tablet (0.4 mg total) under the tongue every 5 (five) minutes as needed for chest pain. 30 tablet 0  . ondansetron (ZOFRAN) 4 MG tablet Take 1 tablet (4 mg total) by mouth daily as needed. 10 tablet 0  . spironolactone (ALDACTONE) 25 MG tablet Take 1 tablet (25 mg total) by mouth daily. 90 tablet 3   No current facility-administered medications on file prior to visit.     Allergies  Allergen Reactions  . No Known Allergies     Past Medical History:  Diagnosis Date  . Arthralgia of temporomandibular joint   . CAD, multiple vessel  a. cath 06/29/16: ostLM 40%, ostLAD 40%, pLAD 95%, ost-pLCx 60%, pLCx 95%, mLCx 60%, mRCA 95%, D2 50%, LVSF nl;  b. 07/2016 CABG x 4 (LIMA->LAD, VG->Diag, VG->OM, VG->RCA); c. 08/2016 Cath: 3VD w/ 4/4 patent grafts. LAD distal to LIMA has diff dzs->Med rx.  . Carotid arterial disease ([place])    a. 07/2016 s/p R CEA.  . Depression   . Diastolic dysfunction    a. echo 06/28/16: EF 50-55%, mild inf wall HK, GR1DD, mild MR, RV sys fxn nl, mildly dilated LA, PASP nl  . Fatty liver disease, nonalcoholic 9937  . HLD (hyperlipidemia)   . Labile hypertension    a. prior renal ngiogram negative for RAS in 03/2016; b. catecholamines and metanephrines normal, mildly elevated renin with normal aldosterone and normal ratio in 02/2016  . Obesity   . PTSD  (post-traumatic stress disorder)   . Tobacco abuse    a. 2018 - cut back from 2 ppd to 0.5 ppd.  . Type 2 diabetes mellitus [facility]) January 2017    Past Surgical History:  Procedure Laterality Date  . CARDIAC CATHETERIZATION [name] 06/29/2016   Procedure: Left Heart Cath and Coronary Angiography;  Surgeon: Minna Merritts, MD;  Location: [place] [facility];  Service: Cardiovascular;  Laterality: [name];  . CARDIAC CATHETERIZATION [name] 08/29/2016   Procedure: Left Heart Cath and Cors/Grafts Angiography;  Surgeon: Wellington Hampshire, MD;  Location: [place] [facility];  Service: Cardiovascular;  Laterality: [name];  . CESAREAN SECTION    . CHOLECYSTECTOMY    . CORONARY ARTERY BYPASS GRAFT [name] 07/06/2016   Procedure: CORONARY ARTERY BYPASS GRAFTING (CABG) x four, using left internal mammary artery and right leg greater saphenous vein harvested endoscopically;  Surgeon: Ivin Poot, MD;  Location: [place];  Service: Open Heart Surgery;  Laterality: [name];  . ENDARTERECTOMY Right 07/06/2016   Procedure: ENDARTERECTOMY CAROTID;  Surgeon: Rosetta Posner, MD;  Location: [place];  Service: Vascular;  Laterality: Right;  . PERIPHERAL VASCULAR CATHETERIZATION [name] 04/18/2016   Procedure: Renal Angiography;  Surgeon: Wellington Hampshire, MD;  Location: [place] [facility];  Service: Cardiovascular;  Laterality: [name];  . TEE WITHOUT CARDIOVERSION [name] 07/06/2016   Procedure: TRANSESOPHAGEAL ECHOCARDIOGRAM (TEE);  Surgeon: Ivin Poot, MD;  Location: [place];  Service: Open Heart Surgery;  Laterality: [name];  . TONSILLECTOMY      Family History  Problem Relation Age of Onset  . Adopted: Yes  . Diabetes Mother   . Diabetes Father   . Alcohol abuse Father   . Heart disease Father   . Drug abuse Father   . Stroke Sister   . Anxiety disorder Sister     Social History   Social History  . Marital status: Married    Spouse name: [name]  . Number of children: [name]  . Years of education: [name]   Occupational History  . Not on  file.   Social History Main Topics  . Smoking status: Current Every Day Smoker    Packs/day: 0.25    Types: Cigarettes    Last attempt to quit: 05/23/2017  . Smokeless tobacco: Never Used     Comment: patient smokes when she drives.   . Alcohol use No  . Drug use: No  . Sexual activity: Yes    Partners: Male    Birth control/ protection: None   Other Topics Concern  . Not on file   Social History Narrative  . No narrative on file   The  PMH, PSH, Social History, Family History, Medications, and allergies have been reviewed in [facility], and have been updated if relevant.   Review of Systems  Respiratory: Negative.   Cardiovascular: Negative.   Gastrointestinal: Negative.   Endocrine: Negative.   Neurological: Positive for headaches. Negative for dizziness, tremors, seizures, syncope, facial asymmetry, speech difficulty, weakness, light-headedness and numbness.  All other systems reviewed and are negative.      Objective:    BP (!) 178/104 (BP Location: Left Arm, Patient Position: Sitting, Cuff Size: Normal)   Pulse 88   Temp 98.1 F (36.7 C) (Oral)   Ht 5\' 7"  (1.702 m)   Wt 220 lb 12.8 oz (100.2 kg)   LMP 10/12/2017   SpO2 96%   BMI 34.58 kg/m   BP Readings from Last 3 Encounters:  10/17/17 (!) 178/104  09/17/17 140/78  08/16/16 (!) 190/106     Physical Exam  Constitutional: She is oriented to person, place, and time. She appears well-developed and well-nourished. No distress.  HENT:  Head: Normocephalic and atraumatic.  Eyes: Conjunctivae are normal.  Cardiovascular: Normal rate and regular rhythm.   Pulmonary/Chest: Effort normal.  Musculoskeletal: Normal range of motion. She exhibits no edema.  Neurological: She is alert and oriented to person, place, and time. She displays normal reflexes. No cranial nerve deficit. Coordination normal.  Skin: Skin is warm and dry. She is not diaphoretic.  Psychiatric: She has a normal mood and affect. Her behavior is  normal. Judgment and thought content normal.  Nursing note and vitals reviewed.         Assessment & Plan:   Other chronic pain No Follow-up on file.

## 2017-10-17 NOTE — Patient Instructions (Signed)
Great to see you.  Please check blood pressure daily- keep a log- okay to take an extra coreg like we discussed.

## 2017-10-17 NOTE — Assessment & Plan Note (Signed)
Indication for chronic opioid: Cluster HA Medication and dose: Norco 7/5- 325- 1 tab every 6 hours as needed # pills per month: 60 Last UDS date: 06/20/17 Pain contract signed (Y/N): Y Date narcotic database last reviewed (include red flags): 10/17/17

## 2017-11-01 DIAGNOSIS — M9903 Segmental and somatic dysfunction of lumbar region: Secondary | ICD-10-CM | POA: Diagnosis not present

## 2017-11-01 DIAGNOSIS — M542 Cervicalgia: Secondary | ICD-10-CM | POA: Diagnosis not present

## 2017-11-01 DIAGNOSIS — M545 Low back pain: Secondary | ICD-10-CM | POA: Diagnosis not present

## 2017-11-01 DIAGNOSIS — M9901 Segmental and somatic dysfunction of cervical region: Secondary | ICD-10-CM | POA: Diagnosis not present

## 2017-11-05 DIAGNOSIS — G44009 Cluster headache syndrome, unspecified, not intractable: Secondary | ICD-10-CM | POA: Diagnosis not present

## 2017-11-18 ENCOUNTER — Ambulatory Visit (INDEPENDENT_AMBULATORY_CARE_PROVIDER_SITE_OTHER): Payer: BLUE CROSS/BLUE SHIELD | Admitting: Family Medicine

## 2017-11-18 VITALS — BP 186/126 | HR 92 | Temp 99.0°F | Ht 67.0 in | Wt 218.4 lb

## 2017-11-18 DIAGNOSIS — I119 Hypertensive heart disease without heart failure: Secondary | ICD-10-CM | POA: Diagnosis not present

## 2017-11-18 DIAGNOSIS — M9901 Segmental and somatic dysfunction of cervical region: Secondary | ICD-10-CM | POA: Diagnosis not present

## 2017-11-18 DIAGNOSIS — F331 Major depressive disorder, recurrent, moderate: Secondary | ICD-10-CM

## 2017-11-18 DIAGNOSIS — M542 Cervicalgia: Secondary | ICD-10-CM | POA: Diagnosis not present

## 2017-11-18 DIAGNOSIS — M9903 Segmental and somatic dysfunction of lumbar region: Secondary | ICD-10-CM | POA: Diagnosis not present

## 2017-11-18 DIAGNOSIS — G44021 Chronic cluster headache, intractable: Secondary | ICD-10-CM

## 2017-11-18 DIAGNOSIS — F332 Major depressive disorder, recurrent severe without psychotic features: Secondary | ICD-10-CM

## 2017-11-18 DIAGNOSIS — M545 Low back pain: Secondary | ICD-10-CM | POA: Diagnosis not present

## 2017-11-18 MED ORDER — HYDROCODONE-ACETAMINOPHEN 7.5-325 MG PO TABS: 1.0000 | ORAL_TABLET | Freq: Four times a day (QID) | ORAL | 0 refills | Status: DC | PRN

## 2017-11-18 NOTE — Addendum Note (Signed)
Addended by: Denna Haggard K on: 11/18/2017 11:50 AM   Modules accepted: Orders

## 2017-11-18 NOTE — Progress Notes (Signed)
Subjective:   Patient ID: Erika [name], female    DOB: 1971/02/09, 46 y.o.   MRN: [id number]  Erika [name] is a pleasant 46 y.o. year old female who presents to clinic today with Hypertension (Patient is here today to F/U with Hypertension.  She is also here to F/U up with Cluster H/A's. )  on 11/18/2017  HPI:  Depression- Deteriorated.  PHQ 9 of 22 today.  Followed by psychiatry, Dr. Adele Schilder.  Currently taking Fluoxetine, Klonipin, Lamictal, thorazine. She feels this is all situational- daughter is very sick. Denies any SI or HI.   HTN- known h/o refractory HTN which has been much better since her CABG and right CEA in 06/2016. Not as well controlled today.  Just took her rxs and under stress with her daughter.  She is asymptomatic.   Cluster headaches-  Stress impacts her headaches.   Tries not to take Norco unless she needs it. No longer using home O2.  Indication for chronic opioid: Cluster HA Medication and dose: Norco 7/5- 325- 1 tab every 6 hours as needed # pills per month: 60 Last UDS date: 06/20/17 Pain contract signed (Y/N): Y Date narcotic database last reviewed (include red flags): 11/18/17   Current Outpatient Medications on File Prior to Visit  Medication Sig Dispense Refill  . aspirin EC 81 MG tablet Take 1 tablet (81 mg total) by mouth daily. 90 tablet 3  . atorvastatin (LIPITOR) 80 MG tablet TAKE ONE TABLET BY MOUTH ONCE DAILY AT 6 PM 90 tablet 2  . benztropine (COGENTIN) 0.5 MG tablet Take 1 tablet (0.5 mg total) by mouth at bedtime. 30 tablet 2  . carvedilol (COREG) 6.25 MG tablet Take 1 tablet (6.25 mg total) by mouth 2 (two) times daily. 60 tablet 6  . chlorproMAZINE (THORAZINE) 25 MG tablet Take 1 tablet (25 mg total) by mouth at bedtime. 30 tablet 2  . clonazePAM (KLONOPIN) 0.5 MG tablet Take 1 tablet (0.5 mg total) by mouth 3 (three) times daily as needed for anxiety. 90 tablet 2  . cyclobenzaprine (FEXMID) 7.5 MG tablet Take 1 tablet (7.5  mg total) by mouth 3 (three) times daily as needed for muscle spasms. 30 tablet 0  . FLUoxetine HCl 60 MG TABS Take 60 mg by mouth daily. 30 tablet 2  . HYDROcodone-acetaminophen (NORCO) 7.5-325 MG tablet Take 1 tablet by mouth every 6 (six) hours as needed for moderate pain. 60 tablet 0  . insulin glargine (LANTUS) 100 UNIT/ML injection Inject 0.2 mLs (20 Units total) into the skin at bedtime. 10 mL 11  . insulin NPH-regular Human (NOVOLIN 70/30) (70-30) 100 UNIT/ML injection Take 5 UNITs twice a day and follow with Primary MD 10 mL 3  . Insulin Syringe-Needle U-100 29G X 1/2" 0.3 ML MISC 1 each by Does not apply route 2 (two) times daily. 30 each 0  . isosorbide mononitrate (IMDUR) 30 MG 24 hr tablet TAKE ONE TABLET BY MOUTH ONCE DAILY 30 tablet 6  . lamoTRIgine (LAMICTAL) 200 MG tablet Take 1 tablet (200 mg total) by mouth daily. 30 tablet 2  . nitroGLYCERIN (NITROSTAT) 0.4 MG SL tablet Place 1 tablet (0.4 mg total) under the tongue every 5 (five) minutes as needed for chest pain. 30 tablet 0  . ondansetron (ZOFRAN) 4 MG tablet Take 1 tablet (4 mg total) by mouth daily as needed. 10 tablet 0  . spironolactone (ALDACTONE) 25 MG tablet Take 1 tablet (25 mg total) by mouth daily. 90 tablet 3  No current facility-administered medications on file prior to visit.     Allergies  Allergen Reactions  . No Known Allergies     Past Medical History:  Diagnosis Date  . Arthralgia of temporomandibular joint   . CAD, multiple vessel    a. cath 06/29/16: ostLM 40%, ostLAD 40%, pLAD 95%, ost-pLCx 60%, pLCx 95%, mLCx 60%, mRCA 95%, D2 50%, LVSF nl;  b. 07/2016 CABG x 4 (LIMA->LAD, VG->Diag, VG->OM, VG->RCA); c. 08/2016 Cath: 3VD w/ 4/4 patent grafts. LAD distal to LIMA has diff dzs->Med rx.  . Carotid arterial disease ([place])    a. 07/2016 s/p R CEA.  . Depression   . Diastolic dysfunction    a. echo 06/28/16: EF 50-55%, mild inf wall HK, GR1DD, mild MR, RV sys fxn nl, mildly dilated LA, PASP nl  . Fatty  liver disease, nonalcoholic 6195  . HLD (hyperlipidemia)   . Labile hypertension    a. prior renal ngiogram negative for RAS in 03/2016; b. catecholamines and metanephrines normal, mildly elevated renin with normal aldosterone and normal ratio in 02/2016  . Obesity   . PTSD (post-traumatic stress disorder)   . Tobacco abuse    a. 2018 - cut back from 2 ppd to 0.5 ppd.  . Type 2 diabetes mellitus [facility]) January 2017    Past Surgical History:  Procedure Laterality Date  . CARDIAC CATHETERIZATION [name] 06/29/2016   Procedure: Left Heart Cath and Coronary Angiography;  Surgeon: Minna Merritts, MD;  Location: [place] [facility];  Service: Cardiovascular;  Laterality: [name];  . CARDIAC CATHETERIZATION [name] 08/29/2016   Procedure: Left Heart Cath and Cors/Grafts Angiography;  Surgeon: Wellington Hampshire, MD;  Location: [place] [facility];  Service: Cardiovascular;  Laterality: [name];  . CESAREAN SECTION    . CHOLECYSTECTOMY    . CORONARY ARTERY BYPASS GRAFT [name] 07/06/2016   Procedure: CORONARY ARTERY BYPASS GRAFTING (CABG) x four, using left internal mammary artery and right leg greater saphenous vein harvested endoscopically;  Surgeon: Ivin Poot, MD;  Location: [place];  Service: Open Heart Surgery;  Laterality: [name];  . ENDARTERECTOMY Right 07/06/2016   Procedure: ENDARTERECTOMY CAROTID;  Surgeon: Rosetta Posner, MD;  Location: [place];  Service: Vascular;  Laterality: Right;  . PERIPHERAL VASCULAR CATHETERIZATION [name] 04/18/2016   Procedure: Renal Angiography;  Surgeon: Wellington Hampshire, MD;  Location: [place] [facility];  Service: Cardiovascular;  Laterality: [name];  . TEE WITHOUT CARDIOVERSION [name] 07/06/2016   Procedure: TRANSESOPHAGEAL ECHOCARDIOGRAM (TEE);  Surgeon: Ivin Poot, MD;  Location: [place];  Service: Open Heart Surgery;  Laterality: [name];  . TONSILLECTOMY      Family History  Adopted: Yes  Problem Relation Age of Onset  . Diabetes Mother   . Diabetes Father   . Alcohol abuse Father   .  Heart disease Father   . Drug abuse Father   . Stroke Sister   . Anxiety disorder Sister     Social History   Socioeconomic History  . Marital status: Married    Spouse name: Not on file  . Number of children: Not on file  . Years of education: Not on file  . Highest education level: Not on file  Social Needs  . Financial resource strain: Not on file  . Food insecurity - worry: Not on file  . Food insecurity - inability: Not on file  . Transportation needs - medical: Not on file  . Transportation needs - non-medical: Not on file  Occupational  History  . Not on file  Tobacco Use  . Smoking status: Current Every Day Smoker    Packs/day: 0.25    Types: Cigarettes    Last attempt to quit: 05/23/2017    Years since quitting: 0.4  . Smokeless tobacco: Never Used  . Tobacco comment: patient smokes when she drives.   Substance and Sexual Activity  . Alcohol use: No    Alcohol/week: 0.0 oz  . Drug use: No  . Sexual activity: Yes    Partners: Male    Birth control/protection: None  Other Topics Concern  . Not on file  Social History Narrative  . Not on file   The PMH, PSH, Social History, Family History, Medications, and allergies have been reviewed in [facility], and have been updated if relevant.   Review of Systems  Respiratory: Negative.   Cardiovascular: Negative.   Gastrointestinal: Negative.   Endocrine: Negative.   Neurological: Positive for headaches. Negative for dizziness, tremors, seizures, syncope, facial asymmetry, speech difficulty, weakness, light-headedness and numbness.  All other systems reviewed and are negative.      Objective:    BP (!) 186/126 (BP Location: Left Arm, Patient Position: Sitting, Cuff Size: Normal)   Pulse 92   Temp 99 F (37.2 C) (Oral)   Ht 5\' 7"  (1.702 m)   Wt 218 lb 6.4 oz (99.1 kg)   LMP 10/27/2017   SpO2 94%   BMI 34.21 kg/m   BP Readings from Last 3 Encounters:  11/18/17 (!) 186/126  10/17/17 (!) 178/104  09/17/17 140/78      Physical Exam  Constitutional: She is oriented to person, place, and time. She appears well-developed and well-nourished. No distress.  HENT:  Head: Normocephalic and atraumatic.  Eyes: Conjunctivae are normal.  Cardiovascular: Normal rate and regular rhythm.  Pulmonary/Chest: Effort normal.  Musculoskeletal: Normal range of motion. She exhibits no edema.  Neurological: She is alert and oriented to person, place, and time. She displays normal reflexes. No cranial nerve deficit. Coordination normal.  Skin: Skin is warm and dry. She is not diaphoretic.  Psychiatric: She has a normal mood and affect. Her behavior is normal. Judgment and thought content normal.  Nursing note and vitals reviewed.         Assessment & Plan:   Hypertension, accelerated with heart disease, without CHF  Intractable chronic cluster headache  Major depressive disorder, recurrent episode, moderate ([place]) No Follow-up on file.

## 2017-11-18 NOTE — Assessment & Plan Note (Signed)
Difficult to control, elevated but just took rxs. She is watching this at home.

## 2017-11-18 NOTE — Assessment & Plan Note (Signed)
Compliant with pain contract. UDS today. See HPI

## 2017-11-18 NOTE — Assessment & Plan Note (Signed)
Deteriorated. Situational. Follow up with her psychiatrist. The patient indicates understanding of these issues and agrees with the plan.

## 2017-11-19 ENCOUNTER — Encounter: Payer: Self-pay | Admitting: Family Medicine

## 2017-11-19 LAB — PAIN MGMT, PROFILE 8 W/CONF, U
6 Acetylmorphine: NEGATIVE ng/mL (ref <10)
Alcohol Metabolites: NEGATIVE ng/mL (ref <500)
Amphetamines: NEGATIVE ng/mL (ref <500)
BENZODIAZEPINES: NEGATIVE ng/mL (ref <100)
BUPRENORPHINE, URINE: NEGATIVE ng/mL (ref <5)
COCAINE METABOLITE: NEGATIVE ng/mL (ref <150)
CREATININE: 24.2 mg/dL
MDMA: NEGATIVE ng/mL (ref <500)
Marijuana Metabolite: NEGATIVE ng/mL (ref <20)
Opiates: NEGATIVE ng/mL (ref <100)
Oxidant: NEGATIVE ug/mL (ref <200)
Oxycodone: NEGATIVE ng/mL (ref <100)
PH: 6.15 (ref 4.5–9.0)

## 2017-11-19 LAB — EXTRA URINE SPECIMEN

## 2017-11-20 ENCOUNTER — Other Ambulatory Visit: Payer: Self-pay

## 2017-11-20 MED ORDER — ONDANSETRON HCL 4 MG PO TABS: 4.0000 mg | ORAL_TABLET | Freq: Every day | ORAL | 0 refills | Status: DC | PRN

## 2017-11-20 NOTE — Progress Notes (Signed)
Sent in Zofran refill/thx dmf

## 2017-11-22 ENCOUNTER — Other Ambulatory Visit (HOSPITAL_COMMUNITY): Payer: Self-pay

## 2017-11-22 DIAGNOSIS — F4310 Post-traumatic stress disorder, unspecified: Secondary | ICD-10-CM

## 2017-11-22 DIAGNOSIS — F331 Major depressive disorder, recurrent, moderate: Secondary | ICD-10-CM

## 2017-11-22 MED ORDER — CHLORPROMAZINE HCL 25 MG PO TABS: 25.0000 mg | ORAL_TABLET | Freq: Every day | ORAL | 0 refills | Status: DC

## 2017-11-22 MED ORDER — BENZTROPINE MESYLATE 0.5 MG PO TABS: 0.5000 mg | ORAL_TABLET | Freq: Every day | ORAL | 0 refills | Status: DC

## 2017-11-22 MED ORDER — CLONAZEPAM 0.5 MG PO TABS: 0.5000 mg | ORAL_TABLET | Freq: Three times a day (TID) | ORAL | 0 refills | Status: DC | PRN

## 2017-11-28 ENCOUNTER — Ambulatory Visit (HOSPITAL_COMMUNITY): Payer: Self-pay | Admitting: Psychiatry

## 2017-12-05 DIAGNOSIS — G44009 Cluster headache syndrome, unspecified, not intractable: Secondary | ICD-10-CM | POA: Diagnosis not present

## 2017-12-11 DIAGNOSIS — M9901 Segmental and somatic dysfunction of cervical region: Secondary | ICD-10-CM | POA: Diagnosis not present

## 2017-12-11 DIAGNOSIS — M542 Cervicalgia: Secondary | ICD-10-CM | POA: Diagnosis not present

## 2017-12-11 DIAGNOSIS — M545 Low back pain: Secondary | ICD-10-CM | POA: Diagnosis not present

## 2017-12-11 DIAGNOSIS — M9903 Segmental and somatic dysfunction of lumbar region: Secondary | ICD-10-CM | POA: Diagnosis not present

## 2017-12-12 DIAGNOSIS — G8918 Other acute postprocedural pain: Secondary | ICD-10-CM | POA: Diagnosis not present

## 2017-12-13 ENCOUNTER — Other Ambulatory Visit: Payer: Self-pay

## 2017-12-13 MED ORDER — INSULIN NPH ISOPHANE & REGULAR (70-30) 100 UNIT/ML ~~LOC~~ SUSP: SUBCUTANEOUS | 0 refills | Status: DC

## 2017-12-13 NOTE — Progress Notes (Signed)
Faxed Novolin/pt has OV in January/thx dmf

## 2017-12-19 DIAGNOSIS — M542 Cervicalgia: Secondary | ICD-10-CM | POA: Diagnosis not present

## 2017-12-19 DIAGNOSIS — M545 Low back pain: Secondary | ICD-10-CM | POA: Diagnosis not present

## 2017-12-19 DIAGNOSIS — M9901 Segmental and somatic dysfunction of cervical region: Secondary | ICD-10-CM | POA: Diagnosis not present

## 2017-12-19 DIAGNOSIS — M9903 Segmental and somatic dysfunction of lumbar region: Secondary | ICD-10-CM | POA: Diagnosis not present

## 2017-12-20 ENCOUNTER — Other Ambulatory Visit (HOSPITAL_COMMUNITY): Payer: Self-pay

## 2017-12-20 DIAGNOSIS — F331 Major depressive disorder, recurrent, moderate: Secondary | ICD-10-CM

## 2017-12-20 DIAGNOSIS — F4310 Post-traumatic stress disorder, unspecified: Secondary | ICD-10-CM

## 2017-12-20 MED ORDER — CLONAZEPAM 0.5 MG PO TABS: 0.5000 mg | ORAL_TABLET | Freq: Three times a day (TID) | ORAL | 0 refills | Status: DC | PRN

## 2017-12-20 MED ORDER — CHLORPROMAZINE HCL 25 MG PO TABS: 25.0000 mg | ORAL_TABLET | Freq: Every day | ORAL | 0 refills | Status: DC

## 2017-12-20 MED ORDER — BENZTROPINE MESYLATE 0.5 MG PO TABS: 0.5000 mg | ORAL_TABLET | Freq: Every day | ORAL | 0 refills | Status: DC

## 2017-12-26 ENCOUNTER — Ambulatory Visit (INDEPENDENT_AMBULATORY_CARE_PROVIDER_SITE_OTHER): Payer: BLUE CROSS/BLUE SHIELD | Admitting: Family Medicine

## 2017-12-26 ENCOUNTER — Encounter: Payer: Self-pay | Admitting: Family Medicine

## 2017-12-26 VITALS — BP 172/106 | HR 87 | Temp 98.0°F | Ht 67.0 in | Wt 223.6 lb

## 2017-12-26 DIAGNOSIS — M25551 Pain in right hip: Secondary | ICD-10-CM

## 2017-12-26 DIAGNOSIS — Z23 Encounter for immunization: Secondary | ICD-10-CM | POA: Diagnosis not present

## 2017-12-26 DIAGNOSIS — G8929 Other chronic pain: Secondary | ICD-10-CM | POA: Diagnosis not present

## 2017-12-26 DIAGNOSIS — I10 Essential (primary) hypertension: Secondary | ICD-10-CM

## 2017-12-26 DIAGNOSIS — M25552 Pain in left hip: Secondary | ICD-10-CM

## 2017-12-26 MED ORDER — HYDROCODONE-ACETAMINOPHEN 7.5-325 MG PO TABS: 1.0000 | ORAL_TABLET | Freq: Four times a day (QID) | ORAL | 0 refills | Status: DC | PRN

## 2017-12-26 MED ORDER — CYCLOBENZAPRINE HCL 7.5 MG PO TABS: 7.5000 mg | ORAL_TABLET | Freq: Three times a day (TID) | ORAL | 0 refills | Status: DC | PRN

## 2017-12-26 MED ORDER — NITROGLYCERIN 0.4 MG SL SUBL: 0.4000 mg | SUBLINGUAL_TABLET | SUBLINGUAL | 0 refills | Status: DC | PRN

## 2017-12-26 NOTE — Assessment & Plan Note (Signed)
Indication for chronic opioid: Cluster HA Medication and dose: Norco 7/5- 325- 1 tab every 6 hours as needed # pills per month: 60 Last UDS date: 06/20/17 Pain contract signed (Y/N): Y Date narcotic database last reviewed (include red flags): 11/18/17

## 2017-12-26 NOTE — Assessment & Plan Note (Signed)
Probable OA.  Refer to Dr. Raeford Razor.

## 2017-12-26 NOTE — Progress Notes (Signed)
Subjective:   Patient ID: Erika [name], female    DOB: March 12, 1971, 47 y.o.   MRN: [id number]  Erika [name] is a pleasant 47 y.o. year old female who presents to clinic today with Hypertension (Patient is here today to F/U with HTN.  She states "Sometimes the medicine works and sometimes it doesn't"); Headache (She is also here today to F/U with Cluster Headaches.  She has been having to take Norco daily for this.); and Joint Pain (She says that she has had joint pain in hips and ankles bilaterally since 11.2017.  It flared up last mid December.  She had to sit in the car for 4 hours at [facility] and it has been severe since.  Chiropractic care is no longer helping.  Possible referral to Ortho?)  on 12/26/2017  HPI:  HTN- known h/o refractory HTN which has been much better since her CABG and right CEA in 06/2016. Not as well controlled today.  Just took her rxs and under stress with her daughter.  She is asymptomatic.   Cluster headaches-  Stress impacts her headaches.   Tries not to take Norco unless she needs it. No longer using home O2.  Indication for chronic opioid: Cluster HA Medication and dose: Norco 7/5- 325- 1 tab every 6 hours as needed # pills per month: 60 Last UDS date: 06/20/17 Pain contract signed (Y/N): Y Date narcotic database last reviewed (include red flags): 11/18/17  She has been having some bilateral hip pain over the past month.  Aggravated by sitting long periods of time and then standing.  "Norco sometimes doesn't touch the pain."  Current Outpatient Medications on File Prior to Visit  Medication Sig Dispense Refill  . aspirin EC 81 MG tablet Take 1 tablet (81 mg total) by mouth daily. 90 tablet 3  . atorvastatin (LIPITOR) 80 MG tablet TAKE ONE TABLET BY MOUTH ONCE DAILY AT 6 PM 90 tablet 2  . benztropine (COGENTIN) 0.5 MG tablet Take 1 tablet (0.5 mg total) by mouth at bedtime. 30 tablet 0  . carvedilol (COREG) 6.25 MG tablet Take 1 tablet (6.25  mg total) by mouth 2 (two) times daily. 60 tablet 6  . chlorproMAZINE (THORAZINE) 25 MG tablet Take 1 tablet (25 mg total) by mouth at bedtime. 30 tablet 0  . clonazePAM (KLONOPIN) 0.5 MG tablet Take 1 tablet (0.5 mg total) by mouth 3 (three) times daily as needed for anxiety. 90 tablet 0  . cyclobenzaprine (FEXMID) 7.5 MG tablet Take 1 tablet (7.5 mg total) by mouth 3 (three) times daily as needed for muscle spasms. 30 tablet 0  . FLUoxetine HCl 60 MG TABS Take 60 mg by mouth daily. 30 tablet 2  . HYDROcodone-acetaminophen (NORCO) 7.5-325 MG tablet Take 1 tablet by mouth every 6 (six) hours as needed for moderate pain. 60 tablet 0  . insulin glargine (LANTUS) 100 UNIT/ML injection Inject 0.2 mLs (20 Units total) into the skin at bedtime. 10 mL 11  . insulin NPH-regular Human (NOVOLIN 70/30) (70-30) 100 UNIT/ML injection Take 5 UNITs twice a day and follow with Primary MD 10 mL 0  . Insulin Syringe-Needle U-100 29G X 1/2" 0.3 ML MISC 1 each by Does not apply route 2 (two) times daily. 30 each 0  . isosorbide mononitrate (IMDUR) 30 MG 24 hr tablet TAKE ONE TABLET BY MOUTH ONCE DAILY 30 tablet 6  . lamoTRIgine (LAMICTAL) 200 MG tablet Take 1 tablet (200 mg total) by mouth daily. 30 tablet 2  .  nitroGLYCERIN (NITROSTAT) 0.4 MG SL tablet Place 1 tablet (0.4 mg total) under the tongue every 5 (five) minutes as needed for chest pain. 30 tablet 0  . ondansetron (ZOFRAN) 4 MG tablet Take 1 tablet (4 mg total) by mouth daily as needed. 10 tablet 0  . spironolactone (ALDACTONE) 25 MG tablet Take 1 tablet (25 mg total) by mouth daily. 90 tablet 3   No current facility-administered medications on file prior to visit.     Allergies  Allergen Reactions  . No Known Allergies     Past Medical History:  Diagnosis Date  . Arthralgia of temporomandibular joint   . CAD, multiple vessel    a. cath 06/29/16: ostLM 40%, ostLAD 40%, pLAD 95%, ost-pLCx 60%, pLCx 95%, mLCx 60%, mRCA 95%, D2 50%, LVSF nl;  b. 07/2016  CABG x 4 (LIMA->LAD, VG->Diag, VG->OM, VG->RCA); c. 08/2016 Cath: 3VD w/ 4/4 patent grafts. LAD distal to LIMA has diff dzs->Med rx.  . Carotid arterial disease ([place])    a. 07/2016 s/p R CEA.  . Depression   . Diastolic dysfunction    a. echo 06/28/16: EF 50-55%, mild inf wall HK, GR1DD, mild MR, RV sys fxn nl, mildly dilated LA, PASP nl  . Fatty liver disease, nonalcoholic 0973  . HLD (hyperlipidemia)   . Labile hypertension    a. prior renal ngiogram negative for RAS in 03/2016; b. catecholamines and metanephrines normal, mildly elevated renin with normal aldosterone and normal ratio in 02/2016  . Obesity   . PTSD (post-traumatic stress disorder)   . Tobacco abuse    a. 2018 - cut back from 2 ppd to 0.5 ppd.  . Type 2 diabetes mellitus [facility]) January 2017    Past Surgical History:  Procedure Laterality Date  . CARDIAC CATHETERIZATION [name] 06/29/2016   Procedure: Left Heart Cath and Coronary Angiography;  Surgeon: Minna Merritts, MD;  Location: [place] [facility];  Service: Cardiovascular;  Laterality: [name];  . CARDIAC CATHETERIZATION [name] 08/29/2016   Procedure: Left Heart Cath and Cors/Grafts Angiography;  Surgeon: Wellington Hampshire, MD;  Location: [place] [facility];  Service: Cardiovascular;  Laterality: [name];  . CESAREAN SECTION    . CHOLECYSTECTOMY    . CORONARY ARTERY BYPASS GRAFT [name] 07/06/2016   Procedure: CORONARY ARTERY BYPASS GRAFTING (CABG) x four, using left internal mammary artery and right leg greater saphenous vein harvested endoscopically;  Surgeon: Ivin Poot, MD;  Location: [place];  Service: Open Heart Surgery;  Laterality: [name];  . ENDARTERECTOMY Right 07/06/2016   Procedure: ENDARTERECTOMY CAROTID;  Surgeon: Rosetta Posner, MD;  Location: [place];  Service: Vascular;  Laterality: Right;  . PERIPHERAL VASCULAR CATHETERIZATION [name] 04/18/2016   Procedure: Renal Angiography;  Surgeon: Wellington Hampshire, MD;  Location: [place] [facility];  Service: Cardiovascular;  Laterality: [name];  .  TEE WITHOUT CARDIOVERSION [name] 07/06/2016   Procedure: TRANSESOPHAGEAL ECHOCARDIOGRAM (TEE);  Surgeon: Ivin Poot, MD;  Location: [place];  Service: Open Heart Surgery;  Laterality: [name];  . TONSILLECTOMY      Family History  Adopted: Yes  Problem Relation Age of Onset  . Diabetes Mother   . Diabetes Father   . Alcohol abuse Father   . Heart disease Father   . Drug abuse Father   . Stroke Sister   . Anxiety disorder Sister     Social History   Socioeconomic History  . Marital status: Married    Spouse name: Not on file  . Number of  children: Not on file  . Years of education: Not on file  . Highest education level: Not on file  Social Needs  . Financial resource strain: Not on file  . Food insecurity - worry: Not on file  . Food insecurity - inability: Not on file  . Transportation needs - medical: Not on file  . Transportation needs - non-medical: Not on file  Occupational History  . Not on file  Tobacco Use  . Smoking status: Current Every Day Smoker    Packs/day: 0.25    Types: Cigarettes    Last attempt to quit: 05/23/2017    Years since quitting: 0.5  . Smokeless tobacco: Never Used  . Tobacco comment: patient smokes when she drives.   Substance and Sexual Activity  . Alcohol use: No    Alcohol/week: 0.0 oz  . Drug use: No  . Sexual activity: Yes    Partners: Male    Birth control/protection: None  Other Topics Concern  . Not on file  Social History Narrative  . Not on file   The PMH, PSH, Social History, Family History, Medications, and allergies have been reviewed in [facility], and have been updated if relevant.   Review of Systems  Respiratory: Negative.   Cardiovascular: Negative.   Gastrointestinal: Negative.   Endocrine: Negative.   Musculoskeletal: Positive for arthralgias. Negative for back pain, gait problem, joint swelling, myalgias, neck pain and neck stiffness.  Neurological: Positive for headaches. Negative for dizziness, tremors, seizures,  syncope, facial asymmetry, speech difficulty, weakness, light-headedness and numbness.  All other systems reviewed and are negative.      Objective:    BP (!) 172/106 (BP Location: Left Arm, Patient Position: Sitting, Cuff Size: Normal)   Pulse 87   Temp 98 F (36.7 C) (Oral)   Ht 5\' 7"  (1.702 m)   Wt 223 lb 9.6 oz (101.4 kg)   LMP 12/09/2017   SpO2 96%   BMI 35.02 kg/m   BP Readings from Last 3 Encounters:  12/26/17 (!) 172/106  11/18/17 (!) 186/126  10/17/17 (!) 178/104     Physical Exam  Constitutional: She is oriented to person, place, and time. She appears well-developed and well-nourished. No distress.  HENT:  Head: Normocephalic and atraumatic.  Eyes: Conjunctivae are normal.  Cardiovascular: Normal rate and regular rhythm.  Pulmonary/Chest: Effort normal.  Musculoskeletal: Normal range of motion. She exhibits no edema.       Right hip: She exhibits tenderness. She exhibits normal range of motion and normal strength.       Left hip: She exhibits tenderness. She exhibits normal range of motion and normal strength.  Neurological: She is alert and oriented to person, place, and time. She displays normal reflexes. No cranial nerve deficit. Coordination normal.  Skin: Skin is warm and dry. She is not diaphoretic.  Psychiatric: She has a normal mood and affect. Her behavior is normal. Judgment and thought content normal.  Nursing note and vitals reviewed.         Assessment & Plan:   Need for 23-polyvalent pneumococcal polysaccharide vaccine - Plan: Pneumococcal polysaccharide vaccine 23-valent greater than or equal to 2yo subcutaneous/IM No Follow-up on file.

## 2017-12-26 NOTE — Patient Instructions (Signed)
Great to see you. Please stop by on your way out to schedule an appointment with Dr. Raeford Razor.

## 2017-12-26 NOTE — Assessment & Plan Note (Signed)
Continue current rxs.  Under a lot of stress right now, fluctuates, she is asymptomatic. No changes made to rxs.

## 2017-12-27 ENCOUNTER — Encounter: Payer: Self-pay | Admitting: Family Medicine

## 2017-12-27 ENCOUNTER — Ambulatory Visit (INDEPENDENT_AMBULATORY_CARE_PROVIDER_SITE_OTHER): Payer: BLUE CROSS/BLUE SHIELD | Admitting: Family Medicine

## 2017-12-27 VITALS — BP 158/102 | HR 86 | Temp 98.8°F | Ht 67.0 in | Wt 216.0 lb

## 2017-12-27 DIAGNOSIS — M67952 Unspecified disorder of synovium and tendon, left thigh: Secondary | ICD-10-CM | POA: Insufficient documentation

## 2017-12-27 DIAGNOSIS — M67951 Unspecified disorder of synovium and tendon, right thigh: Secondary | ICD-10-CM | POA: Insufficient documentation

## 2017-12-27 DIAGNOSIS — G8929 Other chronic pain: Secondary | ICD-10-CM | POA: Diagnosis not present

## 2017-12-27 DIAGNOSIS — M25572 Pain in left ankle and joints of left foot: Secondary | ICD-10-CM

## 2017-12-27 DIAGNOSIS — M6798 Unspecified disorder of synovium and tendon, other site: Secondary | ICD-10-CM

## 2017-12-27 DIAGNOSIS — M25571 Pain in right ankle and joints of right foot: Secondary | ICD-10-CM | POA: Diagnosis not present

## 2017-12-27 MED ORDER — PREDNISONE 5 MG PO TABS
ORAL_TABLET | ORAL | 0 refills | Status: DC
Start: 2017-12-27 — End: 2018-01-27

## 2017-12-27 MED ORDER — DICLOFENAC SODIUM 2 % TD SOLN: 1.0000 "application " | Freq: Two times a day (BID) | TRANSDERMAL | 3 refills | Status: DC

## 2017-12-27 NOTE — Assessment & Plan Note (Signed)
Left seems to be worse than the right.  Significant weakness with hip abduction.  Likely had muscle mass loss when she was hospitalized during her myocardial infarction and CABG.  Does not appear to be coming from her back.  Likely has an underlying component of greater trochanteric bursitis. -Prednisone -Counseled on hip abduction exercises -If no improvement could consider an injection and/or physical therapy.

## 2017-12-27 NOTE — Assessment & Plan Note (Signed)
It appears that she has collapse of the longitudinal arch when she ambulates.  There is more of a subtalar shift on the left as compared to the right.  Posterior tib function is still strong.  Has pronation upon ambulation as well.  Does not have overt hypermobility but could be contributing especially if she has chronic pain. -Counseled on adding support in the way of an insole.  Could consider custom orthotics. -Pennsaid provided -If no improvement of follow-up could consider ultrasound.  Could consider nitro

## 2017-12-27 NOTE — Progress Notes (Signed)
Erika [name] - 47 y.o. female MRN [id number]  Date of birth: 20-Sep-1971  SUBJECTIVE:  Including CC & ROS.  Chief Complaint  Patient presents with  . Bilateral hip pain    Erika [name] is a 47 y.o. female that is presenting with bilateral hip and ankle pain. Pain has been ongoing for one year.  Described as a constant ache.  Denies surgeries or injury to the area. Denies tingling or numbness. Located in the greater trochanteric area.Stting and ambulating pain is still present. Patient has a history of CABG and was in the hospital for a month and a half.  Since that time, her pain symptoms have been increasing.  She is unable to go upstairs without pain.  Prolonged walking also causes pain on the lateral aspect of each hip.  There is no radicular symptoms.  She denies any injury or previous surgery.  She is currently on a narcotic for chronic pain.  Is unable to take NSAIDs due to her cardiovascular history.  She is also complaining of bilateral ankle pain.  The pain is occurring on the posterior aspect of the medial and lateral malleolus.  She denies any injury.  The pain is been ongoing for roughly a year.  Has not had any therapy.  Pain is worse with prolonged walking.  Pain is moderate to severe in nature.  Pain is sharp and stabbing in nature.  Has not found anything that helps her pain.  The pain seems to be staying the same.  Pain is localized to this area.  Review of the CT angios chest and abdomen did not show a dilated abdominal aneurysm and the left and right iliac did not show dissection, stenosis or aneurysm.  Independent review of the lumbar spine from 2014 shows mild degeneration at L4-5.  Independent review of the CT pelvis from 2010 shows good joint space but possible for a component of pincer lesion with left worse than right.  Did not demonstrate any cam lesion.   Review of Systems  Constitutional: Negative for fever.  Respiratory: Negative for shortness of breath.    Cardiovascular: Negative for chest pain.  Gastrointestinal: Negative for abdominal pain.  Musculoskeletal: Positive for arthralgias, back pain and myalgias. Negative for gait problem and joint swelling.  Skin: Negative for color change.  Allergic/Immunologic: Negative for immunocompromised state.  Neurological: Negative for weakness.  Hematological: Negative for adenopathy.  Psychiatric/Behavioral: Negative for agitation.    HISTORY: Past Medical, Surgical, Social, and Family History Reviewed & Updated per EMR.   Pertinent Historical Findings include:  Past Medical History:  Diagnosis Date  . Arthralgia of temporomandibular joint   . CAD, multiple vessel    a. cath 06/29/16: ostLM 40%, ostLAD 40%, pLAD 95%, ost-pLCx 60%, pLCx 95%, mLCx 60%, mRCA 95%, D2 50%, LVSF nl;  b. 07/2016 CABG x 4 (LIMA->LAD, VG->Diag, VG->OM, VG->RCA); c. 08/2016 Cath: 3VD w/ 4/4 patent grafts. LAD distal to LIMA has diff dzs->Med rx.  . Carotid arterial disease ([place])    a. 07/2016 s/p R CEA.  . Depression   . Diastolic dysfunction    a. echo 06/28/16: EF 50-55%, mild inf wall HK, GR1DD, mild MR, RV sys fxn nl, mildly dilated LA, PASP nl  . Fatty liver disease, nonalcoholic 1740  . HLD (hyperlipidemia)   . Labile hypertension    a. prior renal ngiogram negative for RAS in 03/2016; b. catecholamines and metanephrines normal, mildly elevated renin with normal aldosterone and normal ratio in 02/2016  . Obesity   .  PTSD (post-traumatic stress disorder)   . Tobacco abuse    a. 2018 - cut back from 2 ppd to 0.5 ppd.  . Type 2 diabetes mellitus [facility]) January 2017    Past Surgical History:  Procedure Laterality Date  . CARDIAC CATHETERIZATION [name] 06/29/2016   Procedure: Left Heart Cath and Coronary Angiography;  Surgeon: Minna Merritts, MD;  Location: [place] [facility];  Service: Cardiovascular;  Laterality: [name];  . CARDIAC CATHETERIZATION [name] 08/29/2016   Procedure: Left Heart Cath and Cors/Grafts Angiography;   Surgeon: Wellington Hampshire, MD;  Location: [place] [facility];  Service: Cardiovascular;  Laterality: [name];  . CESAREAN SECTION    . CHOLECYSTECTOMY    . CORONARY ARTERY BYPASS GRAFT [name] 07/06/2016   Procedure: CORONARY ARTERY BYPASS GRAFTING (CABG) x four, using left internal mammary artery and right leg greater saphenous vein harvested endoscopically;  Surgeon: Ivin Poot, MD;  Location: [place];  Service: Open Heart Surgery;  Laterality: [name];  . ENDARTERECTOMY Right 07/06/2016   Procedure: ENDARTERECTOMY CAROTID;  Surgeon: Rosetta Posner, MD;  Location: [place];  Service: Vascular;  Laterality: Right;  . PERIPHERAL VASCULAR CATHETERIZATION [name] 04/18/2016   Procedure: Renal Angiography;  Surgeon: Wellington Hampshire, MD;  Location: [place] [facility];  Service: Cardiovascular;  Laterality: [name];  . TEE WITHOUT CARDIOVERSION [name] 07/06/2016   Procedure: TRANSESOPHAGEAL ECHOCARDIOGRAM (TEE);  Surgeon: Ivin Poot, MD;  Location: [name];  Service: Open Heart Surgery;  Laterality: [name];  . TONSILLECTOMY      Allergies  Allergen Reactions  . No Known Allergies     Family History  Adopted: Yes  Problem Relation Age of Onset  . Diabetes Mother   . Diabetes Father   . Alcohol abuse Father   . Heart disease Father   . Drug abuse Father   . Stroke Sister   . Anxiety disorder Sister      Social History   Socioeconomic History  . Marital status: Married    Spouse name: Not on file  . Number of children: Not on file  . Years of education: Not on file  . Highest education level: Not on file  Social Needs  . Financial resource strain: Not on file  . Food insecurity - worry: Not on file  . Food insecurity - inability: Not on file  . Transportation needs - medical: Not on file  . Transportation needs - non-medical: Not on file  Occupational History  . Not on file  Tobacco Use  . Smoking status: Current Every Day Smoker    Packs/day: 0.25    Types: Cigarettes    Last attempt to quit:  05/23/2017    Years since quitting: 0.5  . Smokeless tobacco: Never Used  . Tobacco comment: patient smokes when she drives.   Substance and Sexual Activity  . Alcohol use: No    Alcohol/week: 0.0 oz  . Drug use: No  . Sexual activity: Yes    Partners: Male    Birth control/protection: None  Other Topics Concern  . Not on file  Social History Narrative  . Not on file     PHYSICAL EXAM:  VS: BP (!) 158/102 (BP Location: Left Arm, Patient Position: Sitting, Cuff Size: Normal)   Pulse 86   Temp 98.8 F (37.1 C) (Oral)   Ht 5\' 7"  (1.702 m)   Wt 216 lb (98 kg)   LMP 12/09/2017   SpO2 95%   BMI 33.83 kg/m  Physical Exam  Gen: NAD, alert, cooperative with exam, well-appearing ENT: normal lips, normal nasal mucosa,  Eye: normal EOM, normal conjunctiva and lids CV:  no edema, +2 pedal pulses   Resp: no accessory muscle use, non-labored,  Skin: no rashes, no areas of induration  Neuro: normal tone, normal sensation to touch Psych:  normal insight, alert and oriented MSK:  Right and left hip:  TTP of the GT with left worse than right.  Pain with IR. ROM with IR and ER seems to be appropriate.  Normal strength with hip flexion to resistance.  Normal knee flexion and extension  Significant weakness with hip abduction on left  Right and left ankle  No obvious swelling of tarsal tunnel or of the peroneal tendons  Normal flexion and extension  No swelling of the achilles  Neutral arch with no standing  Has subtalar shift (left worse than right) and a pronator with walking  No abnormal callus formation  Neurovascularly intact       ASSESSMENT & PLAN:   Chronic pain of both ankles It appears that she has collapse of the longitudinal arch when she ambulates.  There is more of a subtalar shift on the left as compared to the right.  Posterior tib function is still strong.  Has pronation upon ambulation as well.  Does not have overt hypermobility but could be contributing  especially if she has chronic pain. -Counseled on adding support in the way of an insole.  Could consider custom orthotics. -Pennsaid provided -If no improvement of follow-up could consider ultrasound.  Could consider nitro  Tendinopathy of left gluteus medius Left seems to be worse than the right.  Significant weakness with hip abduction.  Likely had muscle mass loss when she was hospitalized during her myocardial infarction and CABG.  Does not appear to be coming from her back.  Likely has an underlying component of greater trochanteric bursitis. -Prednisone -Counseled on hip abduction exercises -If no improvement could consider an injection and/or physical therapy.  Tendinopathy of right gluteus medius Weakness with hip abduction. -Counseled on home exercises -Prednisone -If no improvement could consider injection versus physical therapy.

## 2017-12-27 NOTE — Assessment & Plan Note (Signed)
Weakness with hip abduction. -Counseled on home exercises -Prednisone -If no improvement could consider injection versus physical therapy.

## 2017-12-27 NOTE — Patient Instructions (Signed)
Please try to get some insoles to help with your ankles Please try the exercises for the hips.  Please follow up with me in 4-6 weeks.

## 2017-12-28 ENCOUNTER — Other Ambulatory Visit: Payer: Self-pay

## 2017-12-28 ENCOUNTER — Emergency Department
Admission: EM | Admit: 2017-12-28 | Discharge: 2017-12-28 | Disposition: A | Discharge status: Home / Self Care | Payer: BLUE CROSS/BLUE SHIELD | Attending: Emergency Medicine | Admitting: Emergency Medicine

## 2017-12-28 ENCOUNTER — Emergency Department: Payer: BLUE CROSS/BLUE SHIELD

## 2017-12-28 ENCOUNTER — Encounter: Payer: Self-pay | Admitting: Emergency Medicine

## 2017-12-28 DIAGNOSIS — F17210 Nicotine dependence, cigarettes, uncomplicated: Secondary | ICD-10-CM | POA: Diagnosis not present

## 2017-12-28 DIAGNOSIS — Z794 Long term (current) use of insulin: Secondary | ICD-10-CM | POA: Diagnosis not present

## 2017-12-28 DIAGNOSIS — E119 Type 2 diabetes mellitus without complications: Secondary | ICD-10-CM | POA: Insufficient documentation

## 2017-12-28 DIAGNOSIS — G8929 Other chronic pain: Secondary | ICD-10-CM | POA: Diagnosis not present

## 2017-12-28 DIAGNOSIS — I10 Essential (primary) hypertension: Secondary | ICD-10-CM | POA: Diagnosis not present

## 2017-12-28 DIAGNOSIS — M546 Pain in thoracic spine: Secondary | ICD-10-CM | POA: Diagnosis not present

## 2017-12-28 DIAGNOSIS — I110 Hypertensive heart disease with heart failure: Secondary | ICD-10-CM | POA: Diagnosis not present

## 2017-12-28 DIAGNOSIS — R0602 Shortness of breath: Secondary | ICD-10-CM | POA: Diagnosis not present

## 2017-12-28 DIAGNOSIS — I2510 Atherosclerotic heart disease of native coronary artery without angina pectoris: Secondary | ICD-10-CM | POA: Diagnosis not present

## 2017-12-28 DIAGNOSIS — Z7982 Long term (current) use of aspirin: Secondary | ICD-10-CM | POA: Diagnosis not present

## 2017-12-28 DIAGNOSIS — Z79899 Other long term (current) drug therapy: Secondary | ICD-10-CM | POA: Diagnosis not present

## 2017-12-28 DIAGNOSIS — I5032 Chronic diastolic (congestive) heart failure: Secondary | ICD-10-CM | POA: Diagnosis not present

## 2017-12-28 DIAGNOSIS — I25810 Atherosclerosis of coronary artery bypass graft(s) without angina pectoris: Secondary | ICD-10-CM | POA: Diagnosis not present

## 2017-12-28 LAB — CBC WITH DIFFERENTIAL/PLATELET
BASOS ABS: 0 10*3/uL (ref 0–0.1)
BASOS PCT: 0 %
Eosinophils Absolute: 0.1 10*3/uL (ref 0–0.7)
Eosinophils Relative: 1 %
HEMATOCRIT: 46.3 % (ref 35.0–47.0)
Hemoglobin: 15.4 g/dL (ref 12.0–16.0)
LYMPHS PCT: 15 %
Lymphs Abs: 2 10*3/uL (ref 1.0–3.6)
MCH: 29.3 pg (ref 26.0–34.0)
MCHC: 33.1 g/dL (ref 32.0–36.0)
MCV: 88.3 fL (ref 80.0–100.0)
Monocytes Absolute: 0.3 10*3/uL (ref 0.2–0.9)
Monocytes Relative: 3 %
NEUTROS ABS: 10.5 10*3/uL — AB (ref 1.4–6.5)
NEUTROS PCT: 81 %
Platelets: 377 10*3/uL (ref 150–440)
RBC: 5.25 MIL/uL — AB (ref 3.80–5.20)
RDW: 13.9 % (ref 11.5–14.5)
WBC: 13 10*3/uL — AB (ref 3.6–11.0)

## 2017-12-28 LAB — BASIC METABOLIC PANEL
ANION GAP: 11 (ref 5–15)
BUN: 12 mg/dL (ref 6–20)
CHLORIDE: 96 mmol/L — AB (ref 101–111)
CO2: 26 mmol/L (ref 22–32)
Calcium: 10.1 mg/dL (ref 8.9–10.3)
Creatinine, Ser: 0.71 mg/dL (ref 0.44–1.00)
Glucose, Bld: 321 mg/dL — ABNORMAL HIGH (ref 65–99)
POTASSIUM: 4.5 mmol/L (ref 3.5–5.1)
SODIUM: 133 mmol/L — AB (ref 135–145)

## 2017-12-28 LAB — TROPONIN I: Troponin I: <0.03 ng/mL (ref <0.03)

## 2017-12-28 LAB — BRAIN NATRIURETIC PEPTIDE: B Natriuretic Peptide: 15 pg/mL (ref 0.0–100.0)

## 2017-12-28 MED ORDER — ASPIRIN 81 MG PO CHEW
CHEWABLE_TABLET | ORAL | Status: AC
  Administered 2017-12-28: 324 mg via ORAL
  Filled 2017-12-28: qty 4

## 2017-12-28 MED ORDER — ASPIRIN 81 MG PO CHEW
324.0000 mg | CHEWABLE_TABLET | Freq: Once | ORAL | Status: AC
  Administered 2017-12-28: 324 mg via ORAL

## 2017-12-28 MED ORDER — NITROGLYCERIN 0.4 MG SL SUBL
SUBLINGUAL_TABLET | SUBLINGUAL | Status: AC
  Administered 2017-12-28: 0.4 mg via SUBLINGUAL
  Filled 2017-12-28: qty 2

## 2017-12-28 MED ORDER — LABETALOL HCL 5 MG/ML IV SOLN
10.0000 mg | Freq: Once | INTRAVENOUS | Status: AC
  Administered 2017-12-28: 10 mg via INTRAVENOUS
  Filled 2017-12-28: qty 4

## 2017-12-28 MED ORDER — NITROGLYCERIN 0.4 MG SL SUBL
0.4000 mg | SUBLINGUAL_TABLET | SUBLINGUAL | Status: DC | PRN
  Administered 2017-12-28 (×3): 0.4 mg via SUBLINGUAL

## 2017-12-28 MED ORDER — NITROGLYCERIN 0.4 MG SL SUBL
SUBLINGUAL_TABLET | SUBLINGUAL | Status: AC
  Administered 2017-12-28: 0.4 mg via SUBLINGUAL
  Filled 2017-12-28: qty 1

## 2017-12-28 NOTE — ED Provider Notes (Addendum)
[facility] Emergency Department Provider Note ____________________________________________   First MD Initiated Contact with Patient 12/28/17 1923     (approximate)  I have reviewed the triage vital signs and the nursing notes.   HISTORY  Chief Complaint Back Pain    HPI Erika [name] is a 47 y.o. female with past medical history as noted below and most significantly for CAD status post both CABG and stenting in 2017, who presents with upper back pain between her shoulder blades, acute onset at approximately 3 PM after she had walked across her property, and now significantly improved after taking one SL nitro at home.  The pain was accompanied somewhat by lightheadedness and shortness of breath, but no nausea or vomiting.  Patient states she was concerned because her blood pressure remained high so she came to the emergency department.  Patient states that she has had this pain multiple times before, and this feels exactly like the pain she has had in the past when she has had an MI.  She states she usually does not get chest pain.  Past Medical History:  Diagnosis Date  . Arthralgia of temporomandibular joint   . CAD, multiple vessel    a. cath 06/29/16: ostLM 40%, ostLAD 40%, pLAD 95%, ost-pLCx 60%, pLCx 95%, mLCx 60%, mRCA 95%, D2 50%, LVSF nl;  b. 07/2016 CABG x 4 (LIMA->LAD, VG->Diag, VG->OM, VG->RCA); c. 08/2016 Cath: 3VD w/ 4/4 patent grafts. LAD distal to LIMA has diff dzs->Med rx.  . Carotid arterial disease ([place])    a. 07/2016 s/p R CEA.  . Depression   . Diastolic dysfunction    a. echo 06/28/16: EF 50-55%, mild inf wall HK, GR1DD, mild MR, RV sys fxn nl, mildly dilated LA, PASP nl  . Fatty liver disease, nonalcoholic 2542  . HLD (hyperlipidemia)   . Labile hypertension    a. prior renal ngiogram negative for RAS in 03/2016; b. catecholamines and metanephrines normal, mildly elevated renin with normal aldosterone and normal ratio in 02/2016  .  Obesity   . PTSD (post-traumatic stress disorder)   . Tobacco abuse    a. 2018 - cut back from 2 ppd to 0.5 ppd.  . Type 2 diabetes mellitus [facility]) January 2017    Patient Active Problem List   Diagnosis Date Noted  . Chronic pain of both ankles 12/27/2017  . Tendinopathy of right gluteus medius 12/27/2017  . Tendinopathy of left gluteus medius 12/27/2017  . Bilateral hip pain 12/26/2017  . Other chronic pain 10/17/2017  . Elevated troponin I level 05/21/2017  . Sepsis ([place]) 05/19/2017  . Hyperosmolar non-ketotic state in patient with type 2 diabetes mellitus ([name]) 05/19/2017  . Major depressive disorder, recurrent episode, moderate ([place]) 11/19/2016  . Insomnia 10/30/2016  . Hypertension, accelerated with heart disease, without CHF   . Constipation 07/25/2016  . S/P CABG x 4 07/06/2016  . Bradycardia   . CAD (coronary artery disease)   . Carotid stenosis   . CAD in native artery 06/29/2016  . Elevated troponin 06/28/2016  . Essential hypertension, malignant 06/28/2016  . Tobacco abuse 06/28/2016  . Essential hypertension   . Malignant hypertension   . Diabetes ([place])   . Chest pain with high risk for cardiac etiology 06/27/2016  . NSTEMI (non-ST elevated myocardial infarction) ([place]) 06/27/2016  . Proteinuria 03/15/2016  . Renal artery stenosis ([place]) 03/15/2016  . MDD (major depressive disorder) 10/17/2015  . Agoraphobia with panic attacks 04/25/2015  . HTN (hypertension), malignant 10/20/2013  .  Cluster headache 03/20/2012  . HLD (hyperlipidemia) 07/26/2010  . ADJUSTMENT DISORDER WITH MIXED FEATURES 07/26/2010    Past Surgical History:  Procedure Laterality Date  . CARDIAC CATHETERIZATION [name] 06/29/2016   Procedure: Left Heart Cath and Coronary Angiography;  Surgeon: Minna Merritts, MD;  Location: [place] [facility];  Service: Cardiovascular;  Laterality: [name];  . CARDIAC CATHETERIZATION [name] 08/29/2016   Procedure: Left Heart Cath and Cors/Grafts Angiography;  Surgeon:  Wellington Hampshire, MD;  Location: [place] [facility];  Service: Cardiovascular;  Laterality: [name];  . CESAREAN SECTION    . CHOLECYSTECTOMY    . CORONARY ARTERY BYPASS GRAFT [name] 07/06/2016   Procedure: CORONARY ARTERY BYPASS GRAFTING (CABG) x four, using left internal mammary artery and right leg greater saphenous vein harvested endoscopically;  Surgeon: Ivin Poot, MD;  Location: [place];  Service: Open Heart Surgery;  Laterality: [name];  . ENDARTERECTOMY Right 07/06/2016   Procedure: ENDARTERECTOMY CAROTID;  Surgeon: Rosetta Posner, MD;  Location: [place];  Service: Vascular;  Laterality: Right;  . PERIPHERAL VASCULAR CATHETERIZATION [name] 04/18/2016   Procedure: Renal Angiography;  Surgeon: Wellington Hampshire, MD;  Location: [place] [facility];  Service: Cardiovascular;  Laterality: [name];  . TEE WITHOUT CARDIOVERSION [name] 07/06/2016   Procedure: TRANSESOPHAGEAL ECHOCARDIOGRAM (TEE);  Surgeon: Ivin Poot, MD;  Location: [place];  Service: Open Heart Surgery;  Laterality: [name];  . TONSILLECTOMY      Prior to Admission medications   Medication Sig Start Date End Date Taking? Authorizing Provider  aspirin EC 81 MG tablet Take 1 tablet (81 mg total) by mouth daily. 07/17/17   Theora Gianotti, NP  atorvastatin (LIPITOR) 80 MG tablet TAKE ONE TABLET BY MOUTH ONCE DAILY AT 6 PM 08/27/17   Lucille Passy, MD  benztropine (COGENTIN) 0.5 MG tablet Take 1 tablet (0.5 mg total) by mouth at bedtime. 12/20/17 12/20/18  Arfeen, Arlyce Harman, MD  carvedilol (COREG) 6.25 MG tablet Take 1 tablet (6.25 mg total) by mouth 2 (two) times daily. 10/17/17   Lucille Passy, MD  chlorproMAZINE (THORAZINE) 25 MG tablet Take 1 tablet (25 mg total) by mouth at bedtime. 12/20/17   Arfeen, Arlyce Harman, MD  clonazePAM (KLONOPIN) 0.5 MG tablet Take 1 tablet (0.5 mg total) by mouth 3 (three) times daily as needed for anxiety. 12/20/17   Arfeen, Arlyce Harman, MD  cyclobenzaprine (FEXMID) 7.5 MG tablet Take 1 tablet (7.5 mg total) by mouth 3 (three)  times daily as needed for muscle spasms. 12/26/17   Lucille Passy, MD  Diclofenac Sodium (PENNSAID) 2 % SOLN Place 1 application onto the skin 2 (two) times daily. 12/27/17   Rosemarie Ax, MD  FLUoxetine HCl 60 MG TABS Take 60 mg by mouth daily. 08/29/17   Arfeen, Arlyce Harman, MD  HYDROcodone-acetaminophen (NORCO) 7.5-325 MG tablet Take 1 tablet by mouth every 6 (six) hours as needed for moderate pain. 12/26/17   Lucille Passy, MD  insulin glargine (LANTUS) 100 UNIT/ML injection Inject 0.2 mLs (20 Units total) into the skin at bedtime. 05/28/17   Lucille Passy, MD  insulin NPH-regular Human (NOVOLIN 70/30) (70-30) 100 UNIT/ML injection Take 5 UNITs twice a day and follow with Primary MD 12/13/17   Lucille Passy, MD  Insulin Syringe-Needle U-100 29G X 1/2" 0.3 ML MISC 1 each by Does not apply route 2 (two) times daily. 05/20/17   Nita Sells, MD  isosorbide mononitrate (IMDUR) 30 MG 24 hr tablet TAKE ONE  TABLET BY MOUTH ONCE DAILY 06/17/17   Theora Gianotti, NP  lamoTRIgine (LAMICTAL) 200 MG tablet Take 1 tablet (200 mg total) by mouth daily. 08/29/17   Arfeen, Arlyce Harman, MD  nitroGLYCERIN (NITROSTAT) 0.4 MG SL tablet Place 1 tablet (0.4 mg total) under the tongue every 5 (five) minutes as needed for chest pain. 12/26/17   Lucille Passy, MD  ondansetron (ZOFRAN) 4 MG tablet Take 1 tablet (4 mg total) by mouth daily as needed. 11/20/17   Lucille Passy, MD  predniSONE (DELTASONE) 5 MG tablet Take 6 pills for first day, 5 pills second day, 4 pills third day, 3 pills fourth day, 2 pills the fifth day, and 1 pill sixth day. 12/27/17   Rosemarie Ax, MD  spironolactone (ALDACTONE) 25 MG tablet Take 1 tablet (25 mg total) by mouth daily. 05/31/17 08/29/17  Wellington Hampshire, MD    Allergies No known allergies  Family History  Adopted: Yes  Problem Relation Age of Onset  . Diabetes Mother   . Diabetes Father   . Alcohol abuse Father   . Heart disease Father   . Drug abuse Father   . Stroke Sister   .  Anxiety disorder Sister     Social History Social History   Tobacco Use  . Smoking status: Current Every Day Smoker    Packs/day: 0.25    Types: Cigarettes    Last attempt to quit: 05/23/2017    Years since quitting: 0.6  . Smokeless tobacco: Never Used  . Tobacco comment: patient smokes when she drives.   Substance Use Topics  . Alcohol use: No    Alcohol/week: 0.0 oz  . Drug use: No    Review of Systems  Constitutional: No fever. Eyes: No redness. ENT: No sore throat. Cardiovascular: Negative for chest pain. Respiratory: Positive for shortness of breath. Gastrointestinal: No nausea, no vomiting.   Genitourinary: Negative for flank pain.  Musculoskeletal: Positive for back pain. Skin: Negative for rash. Neurological: Negative for headache.   ____________________________________________   PHYSICAL EXAM:  VITAL SIGNS: ED Triage Vitals  Enc Vitals Group     BP 12/28/17 1858 (!) 212/102     Pulse Rate 12/28/17 1858 94     Resp 12/28/17 1858 16     Temp 12/28/17 1858 98 F (36.7 C)     Temp Source 12/28/17 1858 Oral     SpO2 12/28/17 1858 98 %     Weight 12/28/17 1859 216 lb (98 kg)     Height 12/28/17 1859 5\' 7"  (1.702 m)     Head Circumference --      Peak Flow --      Pain Score 12/28/17 1857 6     Pain Loc --      Pain Edu? --      Excl. in [place]? --     Constitutional: Alert and oriented. Well appearing and in no acute distress. Eyes: Conjunctivae are normal.  Head: Atraumatic. Nose: No congestion/rhinnorhea. Mouth/Throat: Mucous membranes are moist.   Neck: Normal range of motion.  Cardiovascular: Normal rate, regular rhythm. Grossly normal heart sounds.  Good peripheral circulation. Respiratory: Normal respiratory effort.  No retractions. Lungs CTAB. Gastrointestinal: No distention.  Genitourinary: No CVA tenderness. Musculoskeletal: No lower extremity edema.  Extremities warm and well perfused.  Neurologic:  Normal speech and language. No gross  focal neurologic deficits are appreciated.  Skin:  Skin is warm and dry. No rash noted. Psychiatric: Mood and affect are normal.  Speech and behavior are normal.  ____________________________________________   LABS (all labs ordered are listed, but only abnormal results are displayed)  Labs Reviewed  BASIC METABOLIC PANEL - Abnormal; Notable for the following components:      Result Value   Sodium 133 (*)    Chloride 96 (*)    Glucose, Bld 321 (*)    All other components within normal limits  CBC WITH DIFFERENTIAL/PLATELET - Abnormal; Notable for the following components:   WBC 13.0 (*)    RBC 5.25 (*)    Neutro Abs 10.5 (*)    All other components within normal limits  TROPONIN I  BRAIN NATRIURETIC PEPTIDE  TROPONIN I   ____________________________________________  EKG  ED ECG REPORT I, Arta Silence, the attending physician, personally viewed and interpreted this ECG.  Date: 12/28/2017 EKG Time: 1850 Rate: 99 Rhythm: normal sinus rhythm QRS Axis: normal Intervals: normal ST/T Wave abnormalities: T wave inversions in lateral leads, and concave appearing ST elevation <2 mm in V2 and V3 Narrative Interpretation: no evidence of acute ischemia; no significant change when compared to EKG of 06/23/2017  ____________________________________________  RADIOLOGY  CXR: No focal infiltrate or other acute findings.  Mediastinum is not widened.    ____________________________________________   PROCEDURES  Procedure(s) performed: No    Critical Care performed: No ____________________________________________   INITIAL IMPRESSION / ASSESSMENT AND PLAN / ED COURSE  Pertinent labs & imaging results that were available during my care of the patient were reviewed by me and considered in my medical decision making (see chart for details).  47 year old female with past medical history as noted above including extensive CAD history presents with upper back pain after  exertion and associated with some shortness of breath and lightheadedness, and similar to pain she has had in the past with her MIs.  The pain improved after she took a sublingual nitro at home, but the blood pressure remained elevated so she came to the emergency department.  On exam, the patient is hypertensive but the other vital signs are normal.  She is relatively well-appearing, and the remainder the exam is unremarkable.  Her EKG shows some nonspecific abnormalities but is unchanged from prior.  I reviewed the past medical records in epic and confirmed the patient's history of prior CAD status post CABG and stenting in 2017 and multivessel disease.  Differential includes ACS especially given the patient's high risk history, but also consider musculoskeletal pain or other benign causes.  There is no clinical evidence to support PE given that the patient has no chest pain, significant difficulty breathing, or tachycardia or hypoxia, and she has no leg pain or swelling to suggest DVT.  I also have a very low suspicion for aortic dissection or other vascular cause given that the patient has had similar pain multiple times in the past and because she has not had chest pain.   We will obtain a chest x-ray but if negative likely no indication for other workup.  We will give aspirin here and an additional nitroglycerin, and then obtain cardiac enzymes x2.  If patient's symptoms continue to improve and she rules out for acute MI by enzymes x2, anticipate likely discharge home; the patient expressed a strong preference to be discharged home if at all possible.    ----------------------------------------- 8:49 PM on 12/28/2017 -----------------------------------------  The patient states that her back pain is resolved.  Her first troponin is negative.  Her blood pressure remains significantly elevated, so we will try  IV labetalol.  Patient states that she has malignant hypertension and that she frequently  has elevated blood pressures in this range that are asymptomatic but refractory to treatment.  We will attempt to treat the blood pressure and observe until the repeat troponin.   At this time again there is no evidence to support aortic dissection or other vascular cause, given that the patient's initial back pain is resolved, she is extremely comfortable appearing, her chest x-ray is normal, and she has had similar symptoms and similar hypertension in the past.  I am signing the patient out to the oncoming physician Dr. Corky Downs. ____________________________________________   FINAL CLINICAL IMPRESSION(S) / ED DIAGNOSES  Final diagnoses:  Thoracic back pain, unspecified back pain laterality, unspecified chronicity  Hypertension, unspecified type      NEW MEDICATIONS STARTED DURING THIS VISIT:  This SmartLink is deprecated. Use AVSMEDLIST instead to display the medication list for a patient.   Note:  This document was prepared using Dragon voice recognition software and may include unintentional dictation errors.      Arta Silence, MD 12/28/17 2107

## 2017-12-28 NOTE — ED Notes (Signed)
ED Provider at bedside. 

## 2017-12-28 NOTE — ED Triage Notes (Signed)
Pt to ED with husband from home c/o back pain between shoulder blades 30 min PTA stabbing pain which is similar to prior pain with MI.  HX of CABG.  Also diaphoretic and nausea. 1 SL nitro with some relief at home.

## 2017-12-28 NOTE — ED Notes (Signed)
Pulled by this rn for ekg

## 2017-12-28 NOTE — ED Provider Notes (Signed)
2nd troponin normal. Patient feels well, appropriate for d/c   Lavonia Drafts, MD 12/28/17 2300

## 2018-01-01 DIAGNOSIS — M9903 Segmental and somatic dysfunction of lumbar region: Secondary | ICD-10-CM | POA: Diagnosis not present

## 2018-01-01 DIAGNOSIS — M9901 Segmental and somatic dysfunction of cervical region: Secondary | ICD-10-CM | POA: Diagnosis not present

## 2018-01-01 DIAGNOSIS — M542 Cervicalgia: Secondary | ICD-10-CM | POA: Diagnosis not present

## 2018-01-01 DIAGNOSIS — M545 Low back pain: Secondary | ICD-10-CM | POA: Diagnosis not present

## 2018-01-03 ENCOUNTER — Other Ambulatory Visit: Payer: Self-pay | Admitting: Family Medicine

## 2018-01-05 DIAGNOSIS — G44009 Cluster headache syndrome, unspecified, not intractable: Secondary | ICD-10-CM | POA: Diagnosis not present

## 2018-01-07 ENCOUNTER — Ambulatory Visit (HOSPITAL_COMMUNITY): Payer: Self-pay | Admitting: Psychiatry

## 2018-01-17 DIAGNOSIS — M545 Low back pain: Secondary | ICD-10-CM | POA: Diagnosis not present

## 2018-01-17 DIAGNOSIS — M542 Cervicalgia: Secondary | ICD-10-CM | POA: Diagnosis not present

## 2018-01-17 DIAGNOSIS — M9903 Segmental and somatic dysfunction of lumbar region: Secondary | ICD-10-CM | POA: Diagnosis not present

## 2018-01-17 DIAGNOSIS — M9901 Segmental and somatic dysfunction of cervical region: Secondary | ICD-10-CM | POA: Diagnosis not present

## 2018-01-22 ENCOUNTER — Ambulatory Visit (INDEPENDENT_AMBULATORY_CARE_PROVIDER_SITE_OTHER): Payer: BLUE CROSS/BLUE SHIELD | Admitting: Psychiatry

## 2018-01-22 ENCOUNTER — Other Ambulatory Visit (HOSPITAL_COMMUNITY): Payer: Self-pay | Admitting: Psychiatry

## 2018-01-22 ENCOUNTER — Encounter (HOSPITAL_COMMUNITY): Payer: Self-pay | Admitting: Psychiatry

## 2018-01-22 DIAGNOSIS — Z811 Family history of alcohol abuse and dependence: Secondary | ICD-10-CM | POA: Diagnosis not present

## 2018-01-22 DIAGNOSIS — Z813 Family history of other psychoactive substance abuse and dependence: Secondary | ICD-10-CM | POA: Diagnosis not present

## 2018-01-22 DIAGNOSIS — Z818 Family history of other mental and behavioral disorders: Secondary | ICD-10-CM

## 2018-01-22 DIAGNOSIS — F331 Major depressive disorder, recurrent, moderate: Secondary | ICD-10-CM

## 2018-01-22 DIAGNOSIS — Z6379 Other stressful life events affecting family and household: Secondary | ICD-10-CM

## 2018-01-22 DIAGNOSIS — F410 Panic disorder [episodic paroxysmal anxiety] without agoraphobia: Secondary | ICD-10-CM | POA: Diagnosis not present

## 2018-01-22 DIAGNOSIS — Z915 Personal history of self-harm: Secondary | ICD-10-CM

## 2018-01-22 DIAGNOSIS — F515 Nightmare disorder: Secondary | ICD-10-CM

## 2018-01-22 DIAGNOSIS — R450 Nervousness: Secondary | ICD-10-CM

## 2018-01-22 DIAGNOSIS — Z91419 Personal history of unspecified adult abuse: Secondary | ICD-10-CM | POA: Diagnosis not present

## 2018-01-22 DIAGNOSIS — F17210 Nicotine dependence, cigarettes, uncomplicated: Secondary | ICD-10-CM | POA: Diagnosis not present

## 2018-01-22 DIAGNOSIS — F4310 Post-traumatic stress disorder, unspecified: Secondary | ICD-10-CM | POA: Diagnosis not present

## 2018-01-22 DIAGNOSIS — F419 Anxiety disorder, unspecified: Secondary | ICD-10-CM

## 2018-01-22 MED ORDER — LAMOTRIGINE 200 MG PO TABS: 200.0000 mg | ORAL_TABLET | Freq: Every day | ORAL | 0 refills | Status: DC

## 2018-01-22 MED ORDER — DIAZEPAM 2 MG PO TABS: ORAL_TABLET | ORAL | 0 refills | Status: DC

## 2018-01-22 MED ORDER — CHLORPROMAZINE HCL 25 MG PO TABS: 25.0000 mg | ORAL_TABLET | Freq: Every day | ORAL | 0 refills | Status: DC

## 2018-01-22 MED ORDER — FLUOXETINE HCL 60 MG PO TABS: 60.0000 mg | ORAL_TABLET | Freq: Every day | ORAL | 0 refills | Status: DC
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
## 2018-01-24 DIAGNOSIS — M9903 Segmental and somatic dysfunction of lumbar region: Secondary | ICD-10-CM | POA: Diagnosis not present

## 2018-01-24 DIAGNOSIS — M545 Low back pain: Secondary | ICD-10-CM | POA: Diagnosis not present

## 2018-01-24 DIAGNOSIS — M9901 Segmental and somatic dysfunction of cervical region: Secondary | ICD-10-CM | POA: Diagnosis not present

## 2018-01-24 DIAGNOSIS — M542 Cervicalgia: Secondary | ICD-10-CM | POA: Diagnosis not present

## 2018-01-27 ENCOUNTER — Other Ambulatory Visit (HOSPITAL_COMMUNITY): Payer: Self-pay | Admitting: Psychiatry

## 2018-01-27 ENCOUNTER — Ambulatory Visit (INDEPENDENT_AMBULATORY_CARE_PROVIDER_SITE_OTHER): Payer: BLUE CROSS/BLUE SHIELD | Admitting: Family Medicine

## 2018-01-27 ENCOUNTER — Encounter: Payer: Self-pay | Admitting: Family Medicine

## 2018-01-27 VITALS — BP 170/106 | HR 86 | Temp 99.1°F | Ht 67.0 in | Wt 226.8 lb

## 2018-01-27 DIAGNOSIS — G44021 Chronic cluster headache, intractable: Secondary | ICD-10-CM | POA: Diagnosis not present

## 2018-01-27 DIAGNOSIS — I10 Essential (primary) hypertension: Secondary | ICD-10-CM

## 2018-01-27 DIAGNOSIS — G8929 Other chronic pain: Secondary | ICD-10-CM | POA: Diagnosis not present

## 2018-01-27 DIAGNOSIS — F331 Major depressive disorder, recurrent, moderate: Secondary | ICD-10-CM

## 2018-01-27 MED ORDER — HYDROCODONE-ACETAMINOPHEN 7.5-325 MG PO TABS: 1.0000 | ORAL_TABLET | Freq: Four times a day (QID) | ORAL | 0 refills | Status: DC | PRN

## 2018-01-27 MED ORDER — CYCLOBENZAPRINE HCL 7.5 MG PO TABS: 7.5000 mg | ORAL_TABLET | Freq: Three times a day (TID) | ORAL | 0 refills | Status: DC | PRN

## 2018-01-27 NOTE — Progress Notes (Signed)
Subjective:   Patient ID: Erika [name], female    DOB: 03/28/1971, 47 y.o.   MRN: [id number]  Erika [name] is a pleasant 47 y.o. year old female who presents to clinic today with Hypertension (Patient is here today to follow-up with HTN. ); Headache (She is also here today to discuss headaches.  States that they are constant.); and Hip Pain (She was also seen by Dr. Raeford Razor who stated that her lack of gleut muscles is causing her hip pain to be worse.)  on 01/27/2018  HPI:  HTN- known h/o refractory HTN which has been much better since her CABG and right CEA in 06/2016.  Cluster headaches-  Stress impacts her headaches. Daughter is having surgery tomorrow. Tries not to take Norco unless she needs it. No longer using home O2.  Indication for chronic opioid: Cluster HA and chronic hip and ankle pain- saw Dr. Raeford Razor for this- note reviewed from 12/27/17- feels she has significant weakness in her hips, likely due to mass mass loss when she was hospitalized for her MI and CABG. Given prednisone, advised on exercises, also given Pennsaid. Medication and dose: Norco 7/5- 325- 1 tab every 6 hours as needed # pills per month: 60 Last UDS date: 06/20/17 Pain contract signed (Y/N): Y Date narcotic database last reviewed (include red flags): 01/27/18   Current Outpatient Medications on File Prior to Visit  Medication Sig Dispense Refill  . aspirin EC 81 MG tablet Take 1 tablet (81 mg total) by mouth daily. 90 tablet 3  . atorvastatin (LIPITOR) 80 MG tablet TAKE ONE TABLET BY MOUTH ONCE DAILY AT 6 PM 90 tablet 2  . benztropine (COGENTIN) 0.5 MG tablet Take 1 tablet (0.5 mg total) by mouth at bedtime. 30 tablet 0  . carvedilol (COREG) 6.25 MG tablet Take 1 tablet (6.25 mg total) by mouth 2 (two) times daily. 60 tablet 6  . chlorproMAZINE (THORAZINE) 25 MG tablet Take 1 tablet (25 mg total) by mouth at bedtime. 90 tablet 0  . cyclobenzaprine (FEXMID) 7.5 MG tablet Take 1 tablet (7.5 mg  total) by mouth 3 (three) times daily as needed for muscle spasms. 30 tablet 0  . diazepam (VALIUM) 2 MG tablet Take 1-2 tab daily for anxiety 45 tablet 0  . Diclofenac Sodium (PENNSAID) 2 % SOLN Place 1 application onto the skin 2 (two) times daily. 1 Bottle 3  . FLUoxetine HCl 60 MG TABS Take 60 mg by mouth daily. 90 tablet 0  . HYDROcodone-acetaminophen (NORCO) 7.5-325 MG tablet Take 1 tablet by mouth every 6 (six) hours as needed for moderate pain. 60 tablet 0  . insulin glargine (LANTUS) 100 UNIT/ML injection Inject 0.2 mLs (20 Units total) into the skin at bedtime. 10 mL 11  . Insulin Syringe-Needle U-100 29G X 1/2" 0.3 ML MISC 1 each by Does not apply route 2 (two) times daily. 30 each 0  . isosorbide mononitrate (IMDUR) 30 MG 24 hr tablet TAKE ONE TABLET BY MOUTH ONCE DAILY 30 tablet 6  . lamoTRIgine (LAMICTAL) 200 MG tablet Take 1 tablet (200 mg total) by mouth daily. 90 tablet 0  . nitroGLYCERIN (NITROSTAT) 0.4 MG SL tablet Place 1 tablet (0.4 mg total) under the tongue every 5 (five) minutes as needed for chest pain. 30 tablet 0  . NOVOLIN 70/30 RELION (70-30) 100 UNIT/ML injection INJECT 5 UNITS SUBCUTANEOUSLY TWICE DAILY 10 mL 2  . ondansetron (ZOFRAN) 4 MG tablet Take 1 tablet (4 mg total) by mouth daily  as needed. 10 tablet 0  . spironolactone (ALDACTONE) 25 MG tablet Take 1 tablet (25 mg total) by mouth daily. 90 tablet 3   No current facility-administered medications on file prior to visit.     Allergies  Allergen Reactions  . No Known Allergies     Past Medical History:  Diagnosis Date  . Arthralgia of temporomandibular joint   . CAD, multiple vessel    a. cath 06/29/16: ostLM 40%, ostLAD 40%, pLAD 95%, ost-pLCx 60%, pLCx 95%, mLCx 60%, mRCA 95%, D2 50%, LVSF nl;  b. 07/2016 CABG x 4 (LIMA->LAD, VG->Diag, VG->OM, VG->RCA); c. 08/2016 Cath: 3VD w/ 4/4 patent grafts. LAD distal to LIMA has diff dzs->Med rx.  . Carotid arterial disease ([place])    a. 07/2016 s/p R CEA.  .  Depression   . Diastolic dysfunction    a. echo 06/28/16: EF 50-55%, mild inf wall HK, GR1DD, mild MR, RV sys fxn nl, mildly dilated LA, PASP nl  . Fatty liver disease, nonalcoholic 3716  . HLD (hyperlipidemia)   . Labile hypertension    a. prior renal ngiogram negative for RAS in 03/2016; b. catecholamines and metanephrines normal, mildly elevated renin with normal aldosterone and normal ratio in 02/2016  . Obesity   . PTSD (post-traumatic stress disorder)   . Tobacco abuse    a. 2018 - cut back from 2 ppd to 0.5 ppd.  . Type 2 diabetes mellitus [facility]) January 2017    Past Surgical History:  Procedure Laterality Date  . CARDIAC CATHETERIZATION [name] 06/29/2016   Procedure: Left Heart Cath and Coronary Angiography;  Surgeon: Minna Merritts, MD;  Location: [place] [facility];  Service: Cardiovascular;  Laterality: [name];  . CARDIAC CATHETERIZATION [name] 08/29/2016   Procedure: Left Heart Cath and Cors/Grafts Angiography;  Surgeon: Wellington Hampshire, MD;  Location: [place] [facility];  Service: Cardiovascular;  Laterality: [name];  . CESAREAN SECTION    . CHOLECYSTECTOMY    . CORONARY ARTERY BYPASS GRAFT [name] 07/06/2016   Procedure: CORONARY ARTERY BYPASS GRAFTING (CABG) x four, using left internal mammary artery and right leg greater saphenous vein harvested endoscopically;  Surgeon: Ivin Poot, MD;  Location: [place];  Service: Open Heart Surgery;  Laterality: [name];  . ENDARTERECTOMY Right 07/06/2016   Procedure: ENDARTERECTOMY CAROTID;  Surgeon: Rosetta Posner, MD;  Location: [place];  Service: Vascular;  Laterality: Right;  . PERIPHERAL VASCULAR CATHETERIZATION [name] 04/18/2016   Procedure: Renal Angiography;  Surgeon: Wellington Hampshire, MD;  Location: [place] [facility];  Service: Cardiovascular;  Laterality: [name];  . TEE WITHOUT CARDIOVERSION [name] 07/06/2016   Procedure: TRANSESOPHAGEAL ECHOCARDIOGRAM (TEE);  Surgeon: Ivin Poot, MD;  Location: [place];  Service: Open Heart Surgery;  Laterality: [name];  .  TONSILLECTOMY      Family History  Adopted: Yes  Problem Relation Age of Onset  . Diabetes Mother   . Diabetes Father   . Alcohol abuse Father   . Heart disease Father   . Drug abuse Father   . Stroke Sister   . Anxiety disorder Sister     Social History   Socioeconomic History  . Marital status: Married    Spouse name: Not on file  . Number of children: Not on file  . Years of education: Not on file  . Highest education level: Not on file  Social Needs  . Financial resource strain: Not on file  . Food insecurity - worry: Not on file  . Food  insecurity - inability: Not on file  . Transportation needs - medical: Not on file  . Transportation needs - non-medical: Not on file  Occupational History  . Not on file  Tobacco Use  . Smoking status: Current Every Day Smoker    Packs/day: 0.25    Types: Cigarettes    Last attempt to quit: 05/23/2017    Years since quitting: 0.6  . Smokeless tobacco: Never Used  . Tobacco comment: patient smokes when she drives.   Substance and Sexual Activity  . Alcohol use: No    Alcohol/week: 0.0 oz  . Drug use: No  . Sexual activity: Yes    Partners: Male    Birth control/protection: None  Other Topics Concern  . Not on file  Social History Narrative  . Not on file   The PMH, PSH, Social History, Family History, Medications, and allergies have been reviewed in [facility], and have been updated if relevant.   Review of Systems  Respiratory: Negative.   Cardiovascular: Negative.   Gastrointestinal: Negative.   Endocrine: Negative.   Musculoskeletal: Negative for arthralgias, back pain, gait problem, joint swelling, myalgias, neck pain and neck stiffness.  Neurological: Positive for headaches. Negative for dizziness, tremors, seizures, syncope, facial asymmetry, speech difficulty, weakness, light-headedness and numbness.  All other systems reviewed and are negative.      Objective:    BP (!) 170/106 (BP Location: Left Arm, Patient  Position: Sitting, Cuff Size: Normal)   Pulse 86   Temp 99.1 F (37.3 C) (Oral)   Ht 5\' 7"  (1.702 m)   Wt 226 lb 12.8 oz (102.9 kg)   LMP 12/24/2017   SpO2 96%   BMI 35.52 kg/m   BP Readings from Last 3 Encounters:  01/27/18 (!) 170/106  12/28/17 (!) 194/91  12/27/17 (!) 158/102     Physical Exam  Constitutional: She is oriented to person, place, and time. She appears well-developed and well-nourished. No distress.  HENT:  Head: Normocephalic and atraumatic.  Eyes: Conjunctivae are normal.  Neck: Normal range of motion.  Cardiovascular: Normal rate and regular rhythm.  Pulmonary/Chest: Effort normal and breath sounds normal.  Musculoskeletal: Normal range of motion. She exhibits no edema.  Neurological: She is alert and oriented to person, place, and time. She displays normal reflexes. No cranial nerve deficit. Coordination normal.  Skin: Skin is warm and dry. She is not diaphoretic.  Psychiatric: She has a normal mood and affect. Her behavior is normal. Judgment and thought content normal.  Nursing note and vitals reviewed.         Assessment & Plan:   Intractable chronic cluster headache  Other chronic pain  Essential hypertension No Follow-up on file.

## 2018-01-27 NOTE — Assessment & Plan Note (Signed)
Continues to be labile and asymptomatic. Continue current rxs. Followed by cards and pt watches her BP closely.

## 2018-01-27 NOTE — Patient Instructions (Signed)
Great to see you. I'm thinking about you and Erika [name].

## 2018-01-27 NOTE — Assessment & Plan Note (Signed)
Compliant with pain contract. See HPI. norco rx printed and given to pt.

## 2018-01-29 ENCOUNTER — Other Ambulatory Visit (HOSPITAL_COMMUNITY): Payer: Self-pay | Admitting: Psychiatry

## 2018-02-05 DIAGNOSIS — G44009 Cluster headache syndrome, unspecified, not intractable: Secondary | ICD-10-CM | POA: Diagnosis not present

## 2018-02-07 DIAGNOSIS — M9901 Segmental and somatic dysfunction of cervical region: Secondary | ICD-10-CM | POA: Diagnosis not present

## 2018-02-07 DIAGNOSIS — M545 Low back pain: Secondary | ICD-10-CM | POA: Diagnosis not present

## 2018-02-07 DIAGNOSIS — M9903 Segmental and somatic dysfunction of lumbar region: Secondary | ICD-10-CM | POA: Diagnosis not present

## 2018-02-07 DIAGNOSIS — M542 Cervicalgia: Secondary | ICD-10-CM | POA: Diagnosis not present

## 2018-02-11 ENCOUNTER — Encounter: Payer: Self-pay | Admitting: Family Medicine

## 2018-02-12 ENCOUNTER — Ambulatory Visit (INDEPENDENT_AMBULATORY_CARE_PROVIDER_SITE_OTHER): Payer: BLUE CROSS/BLUE SHIELD | Admitting: Family Medicine

## 2018-02-12 ENCOUNTER — Encounter: Payer: Self-pay | Admitting: Family Medicine

## 2018-02-12 VITALS — BP 174/104 | HR 96 | Temp 98.2°F | Ht 67.0 in | Wt 220.4 lb

## 2018-02-12 DIAGNOSIS — J069 Acute upper respiratory infection, unspecified: Secondary | ICD-10-CM | POA: Diagnosis not present

## 2018-02-12 MED ORDER — DOXYCYCLINE HYCLATE 100 MG PO TABS
100.0000 mg | ORAL_TABLET | Freq: Two times a day (BID) | ORAL | 0 refills | Status: DC
Start: 2018-02-12 — End: 2018-02-25

## 2018-02-12 MED ORDER — ALBUTEROL SULFATE HFA 108 (90 BASE) MCG/ACT IN AERS: 2.0000 | INHALATION_SPRAY | Freq: Four times a day (QID) | RESPIRATORY_TRACT | 0 refills | Status: DC | PRN

## 2018-02-12 MED ORDER — HYDROCODONE-HOMATROPINE 5-1.5 MG/5ML PO SYRP: 5.0000 mL | ORAL_SOLUTION | Freq: Three times a day (TID) | ORAL | 0 refills | Status: DC | PRN

## 2018-02-12 NOTE — Progress Notes (Signed)
SUBJECTIVE:  Erika [name] is a 47 y.o. female who complains of congestion, sneezing and bilateral sinus pain for 2 days. She denies a history of anorexia and chest pain and denies a history of asthma. Patient admits to smoke cigarettes.  Multiple sick contacts at home and daughter has been in and out of the hospital having kidney surgery.  Current Outpatient Medications on File Prior to Visit  Medication Sig Dispense Refill  . aspirin EC 81 MG tablet Take 1 tablet (81 mg total) by mouth daily. 90 tablet 3  . atorvastatin (LIPITOR) 80 MG tablet TAKE ONE TABLET BY MOUTH ONCE DAILY AT 6 PM 90 tablet 2  . benztropine (COGENTIN) 0.5 MG tablet TAKE 1 TABLET BY MOUTH ONCE DAILY AT BEDTIME 30 tablet 2  . benztropine (COGENTIN) 0.5 MG tablet TAKE 1 TABLET BY MOUTH AT BEDTIME 30 tablet 0  . carvedilol (COREG) 6.25 MG tablet Take 1 tablet (6.25 mg total) by mouth 2 (two) times daily. 60 tablet 6  . chlorproMAZINE (THORAZINE) 25 MG tablet Take 1 tablet (25 mg total) by mouth at bedtime. 90 tablet 0  . cyclobenzaprine (FEXMID) 7.5 MG tablet Take 1 tablet (7.5 mg total) by mouth 3 (three) times daily as needed for muscle spasms. 30 tablet 0  . diazepam (VALIUM) 2 MG tablet Take 1-2 tab daily for anxiety 45 tablet 0  . Diclofenac Sodium (PENNSAID) 2 % SOLN Place 1 application onto the skin 2 (two) times daily. 1 Bottle 3  . FLUoxetine HCl 60 MG TABS Take 60 mg by mouth daily. 90 tablet 0  . HYDROcodone-acetaminophen (NORCO) 7.5-325 MG tablet Take 1 tablet by mouth every 6 (six) hours as needed for moderate pain. 60 tablet 0  . insulin glargine (LANTUS) 100 UNIT/ML injection Inject 0.2 mLs (20 Units total) into the skin at bedtime. 10 mL 11  . Insulin Syringe-Needle U-100 29G X 1/2" 0.3 ML MISC 1 each by Does not apply route 2 (two) times daily. 30 each 0  . isosorbide mononitrate (IMDUR) 30 MG 24 hr tablet TAKE ONE TABLET BY MOUTH ONCE DAILY 30 tablet 6  . lamoTRIgine (LAMICTAL) 200 MG tablet Take 1  tablet (200 mg total) by mouth daily. 90 tablet 0  . nitroGLYCERIN (NITROSTAT) 0.4 MG SL tablet Place 1 tablet (0.4 mg total) under the tongue every 5 (five) minutes as needed for chest pain. 30 tablet 0  . NOVOLIN 70/30 RELION (70-30) 100 UNIT/ML injection INJECT 5 UNITS SUBCUTANEOUSLY TWICE DAILY 10 mL 2  . ondansetron (ZOFRAN) 4 MG tablet Take 1 tablet (4 mg total) by mouth daily as needed. 10 tablet 0  . spironolactone (ALDACTONE) 25 MG tablet Take 1 tablet (25 mg total) by mouth daily. 90 tablet 3   No current facility-administered medications on file prior to visit.     Allergies  Allergen Reactions  . No Known Allergies     Past Medical History:  Diagnosis Date  . Arthralgia of temporomandibular joint   . CAD, multiple vessel    a. cath 06/29/16: ostLM 40%, ostLAD 40%, pLAD 95%, ost-pLCx 60%, pLCx 95%, mLCx 60%, mRCA 95%, D2 50%, LVSF nl;  b. 07/2016 CABG x 4 (LIMA->LAD, VG->Diag, VG->OM, VG->RCA); c. 08/2016 Cath: 3VD w/ 4/4 patent grafts. LAD distal to LIMA has diff dzs->Med rx.  . Carotid arterial disease ([place])    a. 07/2016 s/p R CEA.  . Depression   . Diastolic dysfunction    a. echo 06/28/16: EF 50-55%, mild inf wall HK,  GR1DD, mild MR, RV sys fxn nl, mildly dilated LA, PASP nl  . Fatty liver disease, nonalcoholic 4235  . HLD (hyperlipidemia)   . Labile hypertension    a. prior renal ngiogram negative for RAS in 03/2016; b. catecholamines and metanephrines normal, mildly elevated renin with normal aldosterone and normal ratio in 02/2016  . Obesity   . PTSD (post-traumatic stress disorder)   . Tobacco abuse    a. 2018 - cut back from 2 ppd to 0.5 ppd.  . Type 2 diabetes mellitus [facility]) January 2017    Past Surgical History:  Procedure Laterality Date  . CARDIAC CATHETERIZATION [name] 06/29/2016   Procedure: Left Heart Cath and Coronary Angiography;  Surgeon: Minna Merritts, MD;  Location: [place] [facility];  Service: Cardiovascular;  Laterality: [name];  . CARDIAC  CATHETERIZATION [name] 08/29/2016   Procedure: Left Heart Cath and Cors/Grafts Angiography;  Surgeon: Wellington Hampshire, MD;  Location: [place] [facility];  Service: Cardiovascular;  Laterality: [name];  . CESAREAN SECTION    . CHOLECYSTECTOMY    . CORONARY ARTERY BYPASS GRAFT [name] 07/06/2016   Procedure: CORONARY ARTERY BYPASS GRAFTING (CABG) x four, using left internal mammary artery and right leg greater saphenous vein harvested endoscopically;  Surgeon: Ivin Poot, MD;  Location: [place];  Service: Open Heart Surgery;  Laterality: [name];  . ENDARTERECTOMY Right 07/06/2016   Procedure: ENDARTERECTOMY CAROTID;  Surgeon: Rosetta Posner, MD;  Location: [place];  Service: Vascular;  Laterality: Right;  . PERIPHERAL VASCULAR CATHETERIZATION [name] 04/18/2016   Procedure: Renal Angiography;  Surgeon: Wellington Hampshire, MD;  Location: [place] [facility];  Service: Cardiovascular;  Laterality: [name];  . TEE WITHOUT CARDIOVERSION [name] 07/06/2016   Procedure: TRANSESOPHAGEAL ECHOCARDIOGRAM (TEE);  Surgeon: Ivin Poot, MD;  Location: [place];  Service: Open Heart Surgery;  Laterality: [name];  . TONSILLECTOMY      Family History  Adopted: Yes  Problem Relation Age of Onset  . Diabetes Mother   . Diabetes Father   . Alcohol abuse Father   . Heart disease Father   . Drug abuse Father   . Stroke Sister   . Anxiety disorder Sister     Social History   Socioeconomic History  . Marital status: Married    Spouse name: Not on file  . Number of children: Not on file  . Years of education: Not on file  . Highest education level: Not on file  Social Needs  . Financial resource strain: Not on file  . Food insecurity - worry: Not on file  . Food insecurity - inability: Not on file  . Transportation needs - medical: Not on file  . Transportation needs - non-medical: Not on file  Occupational History  . Not on file  Tobacco Use  . Smoking status: Current Every Day Smoker    Packs/day: 0.25    Types: Cigarettes     Last attempt to quit: 05/23/2017    Years since quitting: 0.7  . Smokeless tobacco: Never Used  . Tobacco comment: patient smokes when she drives.   Substance and Sexual Activity  . Alcohol use: No    Alcohol/week: 0.0 oz  . Drug use: No  . Sexual activity: Yes    Partners: Male    Birth control/protection: None  Other Topics Concern  . Not on file  Social History Narrative  . Not on file   The PMH, PSH, Social History, Family History, Medications, and allergies have been reviewed  in [facility], and have been updated if relevant.  OBJECTIVE: BP (!) 174/104 (BP Location: Left Arm, Patient Position: Sitting, Cuff Size: Normal)   Pulse 96   Temp 98.2 F (36.8 C) (Oral)   Ht 5\' 7"  (1.702 m)   Wt 220 lb 6.4 oz (100 kg)   LMP 01/27/2018   SpO2 95%   BMI 34.52 kg/m   She appears well, vital signs are as noted. Ears normal.  Throat and pharynx normal.  Neck supple. No adenopathy in the neck. Nose is congested. Sinuses non tender. The chest is clear, without wheezes or rales.  ASSESSMENT:  viral upper respiratory illness  PLAN: Most likely viral but given abx rx to fill if symptoms progress. Symptomatic therapy suggested: push fluids, rest and return office visit prn if symptoms persist or worsen. Lack of antibiotic effectiveness discussed with her. Call or return to clinic prn if these symptoms worsen or fail to improve as anticipated.

## 2018-02-18 ENCOUNTER — Ambulatory Visit (HOSPITAL_COMMUNITY)
Admission: RE | Admit: 2018-02-18 | Discharge: 2018-02-18 | Disposition: A | Discharge status: Home / Self Care | Payer: BLUE CROSS/BLUE SHIELD | Source: Ambulatory Visit | Attending: Vascular Surgery | Admitting: Vascular Surgery

## 2018-02-18 ENCOUNTER — Encounter: Payer: Self-pay | Admitting: Family

## 2018-02-18 ENCOUNTER — Encounter: Payer: Self-pay | Admitting: Family Medicine

## 2018-02-18 ENCOUNTER — Ambulatory Visit (INDEPENDENT_AMBULATORY_CARE_PROVIDER_SITE_OTHER): Payer: BLUE CROSS/BLUE SHIELD | Admitting: Family

## 2018-02-18 ENCOUNTER — Telehealth: Payer: Self-pay

## 2018-02-18 VITALS — BP 199/138 | HR 95 | Temp 97.1°F | Resp 16 | Wt 222.2 lb

## 2018-02-18 DIAGNOSIS — I10 Essential (primary) hypertension: Secondary | ICD-10-CM

## 2018-02-18 DIAGNOSIS — I6523 Occlusion and stenosis of bilateral carotid arteries: Secondary | ICD-10-CM | POA: Insufficient documentation

## 2018-02-18 DIAGNOSIS — Z9889 Other specified postprocedural states: Secondary | ICD-10-CM | POA: Diagnosis not present

## 2018-02-18 NOTE — Telephone Encounter (Signed)
No I am not at all mad at her and I am so sorry about Erika [name], her daughter.  Please have her keep an eye on her blood pressure for me.

## 2018-02-18 NOTE — Patient Instructions (Signed)

## 2018-02-18 NOTE — Progress Notes (Signed)
Chief Complaint: Follow up Extracranial Carotid Artery Stenosis   History of Present Illness  Erika [name] is a 47 y.o. female who is s/p combined coronary artery bypass grafting with Dr. Prescott Gum and right carotid endarterectomy by Dr. Donnetta Hutching on 07-06-16. She continues to have hypertension but his had no neurologic deficits and no new cardiac difficulty. She's had extensive workup to include renal arteriography with no identifiable source for severe hypertension.  Dr. Donnetta Hutching last evaluated pt on 02-12-17. At that time carotid duplex revealed widely patent endarterectomy site with no evidence of recurrent stenosis. She has some mild plaque but no stenosis in her left carotid. Stable follow-up. Dr. Donnetta Hutching encouraged her to continue working on her smoking cessation. Return in one year with repeat carotid duplex.  She states she has had about 40 MI's, denies any hx of stroke or TIA. Pt states her grandmother had uncontrolled hypertension, and she died at age 73 of a brain aneurysm.   She states she feels stressed today as she found out yesterday that her daughter will need hemodialysis, has congenital renal reflux.  She denies chest pain, denies dyspnea, denies headache, denies feeling light headed.    Diabetic: yes, last A1C result on file was 8.4 on 08-15-17 Tobacco use: smoker  (3/4 ppd, started at age 27 yrs)  Pt meds include: Statin : yes ASA: yes Other anticoagulants/antiplatelets: no   Past Medical History:  Diagnosis Date  . Arthralgia of temporomandibular joint   . CAD, multiple vessel    a. cath 06/29/16: ostLM 40%, ostLAD 40%, pLAD 95%, ost-pLCx 60%, pLCx 95%, mLCx 60%, mRCA 95%, D2 50%, LVSF nl;  b. 07/2016 CABG x 4 (LIMA->LAD, VG->Diag, VG->OM, VG->RCA); c. 08/2016 Cath: 3VD w/ 4/4 patent grafts. LAD distal to LIMA has diff dzs->Med rx.  . Carotid arterial disease ([place])    a. 07/2016 s/p R CEA.  . Depression   . Diastolic dysfunction    a. echo 06/28/16: EF 50-55%, mild inf  wall HK, GR1DD, mild MR, RV sys fxn nl, mildly dilated LA, PASP nl  . Fatty liver disease, nonalcoholic 4008  . HLD (hyperlipidemia)   . Labile hypertension    a. prior renal ngiogram negative for RAS in 03/2016; b. catecholamines and metanephrines normal, mildly elevated renin with normal aldosterone and normal ratio in 02/2016  . Obesity   . PTSD (post-traumatic stress disorder)   . Tobacco abuse    a. 2018 - cut back from 2 ppd to 0.5 ppd.  . Type 2 diabetes mellitus [facility]) January 2017    Social History Social History   Tobacco Use  . Smoking status: Current Every Day Smoker    Packs/day: 0.25    Types: Cigarettes    Last attempt to quit: 05/23/2017    Years since quitting: 0.7  . Smokeless tobacco: Never Used  . Tobacco comment: patient smokes when she drives.   Substance Use Topics  . Alcohol use: No    Alcohol/week: 0.0 oz  . Drug use: No    Family History Family History  Adopted: Yes  Problem Relation Age of Onset  . Diabetes Mother   . Diabetes Father   . Alcohol abuse Father   . Heart disease Father   . Drug abuse Father   . Stroke Sister   . Anxiety disorder Sister     Surgical History Past Surgical History:  Procedure Laterality Date  . CARDIAC CATHETERIZATION [name] 06/29/2016   Procedure: Left Heart Cath and Coronary Angiography;  Surgeon: Minna Merritts, MD;  Location: [place] [facility];  Service: Cardiovascular;  Laterality: [name];  . CARDIAC CATHETERIZATION [name] 08/29/2016   Procedure: Left Heart Cath and Cors/Grafts Angiography;  Surgeon: Wellington Hampshire, MD;  Location: [place] [facility];  Service: Cardiovascular;  Laterality: [name];  . CESAREAN SECTION    . CHOLECYSTECTOMY    . CORONARY ARTERY BYPASS GRAFT [name] 07/06/2016   Procedure: CORONARY ARTERY BYPASS GRAFTING (CABG) x four, using left internal mammary artery and right leg greater saphenous vein harvested endoscopically;  Surgeon: Ivin Poot, MD;  Location: [place];  Service: Open Heart Surgery;   Laterality: [name];  . ENDARTERECTOMY Right 07/06/2016   Procedure: ENDARTERECTOMY CAROTID;  Surgeon: Rosetta Posner, MD;  Location: [place];  Service: Vascular;  Laterality: Right;  . PERIPHERAL VASCULAR CATHETERIZATION [name] 04/18/2016   Procedure: Renal Angiography;  Surgeon: Wellington Hampshire, MD;  Location: [place] [facility];  Service: Cardiovascular;  Laterality: [name];  . TEE WITHOUT CARDIOVERSION [name] 07/06/2016   Procedure: TRANSESOPHAGEAL ECHOCARDIOGRAM (TEE);  Surgeon: Ivin Poot, MD;  Location: [place];  Service: Open Heart Surgery;  Laterality: [name];  . TONSILLECTOMY      Allergies  Allergen Reactions  . No Known Allergies     Current Outpatient Medications  Medication Sig Dispense Refill  . albuterol (PROVENTIL HFA;VENTOLIN HFA) 108 (90 Base) MCG/ACT inhaler Inhale 2 puffs into the lungs every 6 (six) hours as needed for wheezing. 1 Inhaler 0  . aspirin EC 81 MG tablet Take 1 tablet (81 mg total) by mouth daily. 90 tablet 3  . atorvastatin (LIPITOR) 80 MG tablet TAKE ONE TABLET BY MOUTH ONCE DAILY AT 6 PM 90 tablet 2  . benztropine (COGENTIN) 0.5 MG tablet TAKE 1 TABLET BY MOUTH AT BEDTIME 30 tablet 0  . carvedilol (COREG) 6.25 MG tablet Take 1 tablet (6.25 mg total) by mouth 2 (two) times daily. 60 tablet 6  . chlorproMAZINE (THORAZINE) 25 MG tablet Take 1 tablet (25 mg total) by mouth at bedtime. 90 tablet 0  . cyclobenzaprine (FEXMID) 7.5 MG tablet Take 1 tablet (7.5 mg total) by mouth 3 (three) times daily as needed for muscle spasms. 30 tablet 0  . diazepam (VALIUM) 2 MG tablet Take 1-2 tab daily for anxiety 45 tablet 0  . Diclofenac Sodium (PENNSAID) 2 % SOLN Place 1 application onto the skin 2 (two) times daily. 1 Bottle 3  . doxycycline (VIBRA-TABS) 100 MG tablet Take 1 tablet (100 mg total) by mouth 2 (two) times daily. 14 tablet 0  . FLUoxetine HCl 60 MG TABS Take 60 mg by mouth daily. 90 tablet 0  . HYDROcodone-acetaminophen (NORCO) 7.5-325 MG tablet Take 1 tablet by mouth  every 6 (six) hours as needed for moderate pain. 60 tablet 0  . HYDROcodone-homatropine (HYCODAN) 5-1.5 MG/5ML syrup Take 5 mLs by mouth every 8 (eight) hours as needed for cough. 120 mL 0  . insulin glargine (LANTUS) 100 UNIT/ML injection Inject 0.2 mLs (20 Units total) into the skin at bedtime. 10 mL 11  . Insulin Syringe-Needle U-100 29G X 1/2" 0.3 ML MISC 1 each by Does not apply route 2 (two) times daily. 30 each 0  . isosorbide mononitrate (IMDUR) 30 MG 24 hr tablet TAKE ONE TABLET BY MOUTH ONCE DAILY 30 tablet 6  . lamoTRIgine (LAMICTAL) 200 MG tablet Take 1 tablet (200 mg total) by mouth daily. 90 tablet 0  . nitroGLYCERIN (NITROSTAT) 0.4 MG SL tablet Place 1 tablet (  0.4 mg total) under the tongue every 5 (five) minutes as needed for chest pain. 30 tablet 0  . NOVOLIN 70/30 RELION (70-30) 100 UNIT/ML injection INJECT 5 UNITS SUBCUTANEOUSLY TWICE DAILY 10 mL 2  . ondansetron (ZOFRAN) 4 MG tablet Take 1 tablet (4 mg total) by mouth daily as needed. 10 tablet 0  . benztropine (COGENTIN) 0.5 MG tablet TAKE 1 TABLET BY MOUTH ONCE DAILY AT BEDTIME (Patient not taking: Reported on 02/18/2018) 30 tablet 2  . spironolactone (ALDACTONE) 25 MG tablet Take 1 tablet (25 mg total) by mouth daily. 90 tablet 3   No current facility-administered medications for this visit.     Review of Systems : See HPI for pertinent positives and negatives.  Physical Examination  Vitals:   02/18/18 1137 02/18/18 1140 02/18/18 1152 02/18/18 1158  BP: (!) 213/115 (!) 197/125 (!) 196/132 (!) 199/138  Pulse: 95     Resp: 16     Temp: (!) 97.1 F (36.2 C)     TempSrc: Oral     SpO2: 97%     Weight: 222 lb 3.2 oz (100.8 kg)      Body mass index is 34.8 kg/m.  General: WDWN obese (central adiposity) female in NAD GAIT: normal Eyes: PERRLA. Bilateral pupils are dilated, equal size.  HENT: No gross abnormalities.  Pulmonary:  Respirations are non-labored, good air movement, CTAB, no rales,  rhonchi, or  wheezing.  Cardiac: regular rhythm, no detected murmur.  VASCULAR EXAM Carotid Bruits Right Left   Negative Negative     Abdominal aortic pulse is not palpable. Radial pulses are 2+ palpable and equal.                                                                                                                            LE Pulses Right Left       POPLITEAL  not palpable   not palpable       POSTERIOR TIBIAL  not palpable   not palpable        DORSALIS PEDIS      ANTERIOR TIBIAL 1+ palpable  1+ palpable     Gastrointestinal: soft, nontender, BS WNL, no r/g, no palpable masses. Musculoskeletal: No muscle atrophy/wasting. M/S 5/5 throughout except 4/5 in lower extremities, extremities without ischemic changes. Skin: No rashes, no ulcers, no cellulitis.   Neurologic:  A&O X 3; appropriate affect, sensation is normal; speech is normal, CN 2-12 intact, pain and light touch intact in extremities, motor exam as listed above. Psychiatric: Normal thought content, mood appropriate to clinical situation.     Assessment: ADELYN ROSCHER is a 46 y.o. female who is s/p combined coronary artery bypass grafting with Dr. Prescott Gum and right carotid endarterectomy by Dr. Donnetta Hutching on 07-06-16.    Her atherosclerotic risk factors include uncontrolled DM, continued smoking, CAD, uncontrolled hypertension, and central adiposity.    I spoke with Dr. Donnetta Hutching re pt's asymptomatic hypertension, current blood pressures. I  then spoke with pt's PCP, Dr. Alphonsa Overall, re this, and seeking advice whether to send pt to her office of to the ED. Her advice was to send pt to the ED. She states her blood pressure has been difficult to manage, when clonidine or hydralazine were added in the past, that at times she became hypotensive.   I also spoke with Dr. Donnetta Hutching re pt's bilateral vertebral arteries were antegrade at her last visit; today right vertebral artery cannot be identified, and the left vertebral artery has  high resistant flow which may suggest distal obstruction; see Plan.  I spoke with triage nurse at [facility] ED, let her know pt HPI and current blood pressures, and that pt is coming there by private vehicle.    DATA Carotid Duplex (02/18/18): Right ICA: (CEA site) with no restenosis. Left ICA: 1-39% stenosis. Bilateral ECA with >50% stenosis High resistant flow in the left vertebral artery may suggest distal obstruction. No evidence of flow detected in the right vertebral artery.  Normal flow dynamics in the bilateral subclavian arteries.  Vertebral arteries were antegrade at her visit on 02-12-17.   Plan: Follow-up in 1 year with Carotid Duplex scan.   I discussed in depth with the patient the nature of atherosclerosis, and emphasized the importance of maximal medical management including strict control of blood pressure, blood glucose, and lipid levels, obtaining regular exercise, and cessation of smoking.  The patient is aware that without maximal medical management the underlying atherosclerotic disease process will progress, limiting the benefit of any interventions. The patient was given information about stroke prevention and what symptoms should prompt the patient to seek immediate medical care. Thank you for allowing [name] to participate in this patient's care.  Clemon Chambers, RN, MSN, FNP-C [facility] Office: [phone]  Clinic Physician: Early  02/18/18 12:06 PM

## 2018-02-18 NOTE — Telephone Encounter (Signed)
TA-Pt called stating that she hopes your not mad at her but she did not go to the ED/she was asymptomatic/she knew it was because she was stressed due to bad news she received yesterday/she went home and took anti-stress med and layed down and her BP came down to 150/94  Found out yesterday that daughter has to be put on transplant list/begs for you not to be mad at her/plz advise if you need for me to tell her anything/thx dmf

## 2018-02-19 LAB — VAS US CAROTID
LCCAPDIAS: 29 cm/s
LCCAPSYS: 85 cm/s
LEFT ECA DIAS: -70 cm/s
LEFT VERTEBRAL DIAS: 3 cm/s
LICADDIAS: -43 cm/s
Left CCA dist dias: -37 cm/s
Left CCA dist sys: -107 cm/s
Left ICA dist sys: -112 cm/s
Left ICA prox dias: -32 cm/s
Left ICA prox sys: -97 cm/s
RCCADSYS: -101 cm/s
RCCAPSYS: 109 cm/s
RIGHT CCA MID DIAS: -13 cm/s
RIGHT ECA DIAS: 58 cm/s
RIGHT VERTEBRAL DIAS: 0 cm/s
Right CCA prox dias: 30 cm/s

## 2018-02-19 NOTE — Telephone Encounter (Signed)
Pt aware/she will keep an eye on her BP/thx dmf

## 2018-02-24 ENCOUNTER — Telehealth (HOSPITAL_COMMUNITY): Payer: Self-pay

## 2018-02-24 ENCOUNTER — Other Ambulatory Visit: Payer: Self-pay | Admitting: Nurse Practitioner

## 2018-02-24 NOTE — Addendum Note (Signed)
Addended by: Marrion Coy on: 02/24/2018 04:51 PM   Modules accepted: Orders

## 2018-02-24 NOTE — Telephone Encounter (Signed)
Patient is calling because she said she has been taking the Valium now for a month and she does not like how it made her feel. She would like to go back on the Klonopin and she said you mentioned a higher dose of Klonopin.

## 2018-02-25 ENCOUNTER — Ambulatory Visit (INDEPENDENT_AMBULATORY_CARE_PROVIDER_SITE_OTHER): Payer: BLUE CROSS/BLUE SHIELD | Admitting: Family Medicine

## 2018-02-25 ENCOUNTER — Other Ambulatory Visit (HOSPITAL_COMMUNITY)
Admission: RE | Admit: 2018-02-25 | Discharge: 2018-02-25 | Disposition: A | Discharge status: Home / Self Care | Payer: BLUE CROSS/BLUE SHIELD | Source: Ambulatory Visit | Attending: Family Medicine | Admitting: Family Medicine

## 2018-02-25 ENCOUNTER — Encounter: Payer: Self-pay | Admitting: Family Medicine

## 2018-02-25 VITALS — BP 196/106 | HR 101 | Temp 98.4°F | Ht 66.25 in | Wt 224.4 lb

## 2018-02-25 DIAGNOSIS — I10 Essential (primary) hypertension: Secondary | ICD-10-CM

## 2018-02-25 DIAGNOSIS — Z1239 Encounter for other screening for malignant neoplasm of breast: Secondary | ICD-10-CM

## 2018-02-25 DIAGNOSIS — E785 Hyperlipidemia, unspecified: Secondary | ICD-10-CM | POA: Diagnosis not present

## 2018-02-25 DIAGNOSIS — Z01419 Encounter for gynecological examination (general) (routine) without abnormal findings: Secondary | ICD-10-CM | POA: Diagnosis not present

## 2018-02-25 DIAGNOSIS — E1169 Type 2 diabetes mellitus with other specified complication: Secondary | ICD-10-CM

## 2018-02-25 DIAGNOSIS — Z0000 Encounter for general adult medical examination without abnormal findings: Secondary | ICD-10-CM

## 2018-02-25 DIAGNOSIS — Z1231 Encounter for screening mammogram for malignant neoplasm of breast: Secondary | ICD-10-CM

## 2018-02-25 LAB — CBC WITH DIFFERENTIAL/PLATELET
BASOS ABS: 0.2 10*3/uL — AB (ref 0.0–0.1)
BASOS PCT: 1.1 % (ref 0.0–3.0)
Eosinophils Absolute: 0.2 10*3/uL (ref 0.0–0.7)
Eosinophils Relative: 1.7 % (ref 0.0–5.0)
HEMATOCRIT: 45.2 % (ref 36.0–46.0)
Hemoglobin: 15.3 g/dL — ABNORMAL HIGH (ref 12.0–15.0)
LYMPHS ABS: 3.5 10*3/uL (ref 0.7–4.0)
LYMPHS PCT: 24.5 % (ref 12.0–46.0)
MCHC: 33.8 g/dL (ref 30.0–36.0)
MCV: 87.8 fl (ref 78.0–100.0)
MONOS PCT: 4.5 % (ref 3.0–12.0)
Monocytes Absolute: 0.6 10*3/uL (ref 0.1–1.0)
NEUTROS ABS: 9.7 10*3/uL — AB (ref 1.4–7.7)
NEUTROS PCT: 68.2 % (ref 43.0–77.0)
PLATELETS: 391 10*3/uL (ref 150.0–400.0)
RBC: 5.15 Mil/uL — ABNORMAL HIGH (ref 3.87–5.11)
RDW: 13.7 % (ref 11.5–15.5)
WBC: 14.2 10*3/uL — ABNORMAL HIGH (ref 4.0–10.5)

## 2018-02-25 LAB — COMPREHENSIVE METABOLIC PANEL
ALT: 23 U/L (ref 0–35)
AST: 20 U/L (ref 0–37)
Albumin: 4.2 g/dL (ref 3.5–5.2)
Alkaline Phosphatase: 71 U/L (ref 39–117)
BILIRUBIN TOTAL: 0.3 mg/dL (ref 0.2–1.2)
BUN: 10 mg/dL (ref 6–23)
CALCIUM: 10.8 mg/dL — AB (ref 8.4–10.5)
CHLORIDE: 96 meq/L (ref 96–112)
CO2: 28 meq/L (ref 19–32)
Creatinine, Ser: 0.61 mg/dL (ref 0.40–1.20)
GFR: 113.13 mL/min (ref >60.00)
GLUCOSE: 201 mg/dL — AB (ref 70–99)
POTASSIUM: 4.4 meq/L (ref 3.5–5.1)
Sodium: 133 mEq/L — ABNORMAL LOW (ref 135–145)
Total Protein: 7.4 g/dL (ref 6.0–8.3)

## 2018-02-25 LAB — LIPID PANEL
CHOL/HDL RATIO: 8
Cholesterol: 240 mg/dL — ABNORMAL HIGH (ref 0–200)
HDL: 30.5 mg/dL — AB (ref >39.00)

## 2018-02-25 LAB — TSH: TSH: 1.68 u[IU]/mL (ref 0.35–4.50)

## 2018-02-25 LAB — HEMOGLOBIN A1C: Hgb A1c MFr Bld: 9.6 % — ABNORMAL HIGH (ref 4.6–6.5)

## 2018-02-25 LAB — LDL CHOLESTEROL, DIRECT: LDL DIRECT: 162 mg/dL

## 2018-02-25 MED ORDER — HYDROCODONE-ACETAMINOPHEN 7.5-325 MG PO TABS: 1.0000 | ORAL_TABLET | Freq: Four times a day (QID) | ORAL | 0 refills | Status: DC | PRN

## 2018-02-25 MED ORDER — CYCLOBENZAPRINE HCL 7.5 MG PO TABS: 7.5000 mg | ORAL_TABLET | Freq: Three times a day (TID) | ORAL | 2 refills | Status: DC | PRN

## 2018-02-25 MED ORDER — ONDANSETRON HCL 4 MG PO TABS: 4.0000 mg | ORAL_TABLET | Freq: Every day | ORAL | 2 refills | Status: DC | PRN

## 2018-02-25 NOTE — Patient Instructions (Signed)
Great to see you. I will call you with your lab results from today and you can view them online.   Please call the breast center at [phone] to schedule your mammogram.  

## 2018-02-25 NOTE — Assessment & Plan Note (Signed)
Very elevated again, just took her rx. Asymptomatic and upset because we were talking about her daughter.  At home, highest readings in 150s. She will check BP at home and send me readings. No changes made today.

## 2018-02-25 NOTE — Assessment & Plan Note (Signed)
Labs today. No changes made.

## 2018-02-25 NOTE — Assessment & Plan Note (Signed)
No changes made. Labs today.

## 2018-02-25 NOTE — Progress Notes (Signed)
Subjective:   Patient ID: Erika [name], female    DOB: 1971/01/29, 47 y.o.   MRN: [id number]  Erika [name] is a pleasant 47 y.o. year old female who presents to clinic today with Annual Exam (Patient is here today for CPE with PAP.  She is currently fasting.)  on 02/25/2018  HPI:  Overdue for pap smear and mammogram.  Having a tough time- daughter has to go on dialysis. BP elevated again here but comes down to 101B systolic at home (higher than usual given her stressors)/  Health Maintenance  Topic Date Due  . PAP SMEAR  05/02/2015  . HEMOGLOBIN A1C  02/15/2018  . FOOT EXAM  08/15/2018  . URINE MICROALBUMIN  08/15/2018  . OPHTHALMOLOGY EXAM  10/26/2018  . TETANUS/TDAP  07/26/2020  . PNEUMOCOCCAL POLYSACCHARIDE VACCINE (2) 12/26/2022  . INFLUENZA VACCINE  Completed  . HIV Screening  Completed   Current Outpatient Medications on File Prior to Visit  Medication Sig Dispense Refill  . albuterol (PROVENTIL HFA;VENTOLIN HFA) 108 (90 Base) MCG/ACT inhaler Inhale 2 puffs into the lungs every 6 (six) hours as needed for wheezing. 1 Inhaler 0  . aspirin EC 81 MG tablet Take 1 tablet (81 mg total) by mouth daily. 90 tablet 3  . atorvastatin (LIPITOR) 80 MG tablet TAKE ONE TABLET BY MOUTH ONCE DAILY AT 6 PM 90 tablet 2  . benztropine (COGENTIN) 0.5 MG tablet TAKE 1 TABLET BY MOUTH AT BEDTIME 30 tablet 0  . carvedilol (COREG) 6.25 MG tablet Take 1 tablet (6.25 mg total) by mouth 2 (two) times daily. 60 tablet 6  . chlorproMAZINE (THORAZINE) 25 MG tablet Take 1 tablet (25 mg total) by mouth at bedtime. 90 tablet 0  . cyclobenzaprine (FEXMID) 7.5 MG tablet Take 1 tablet (7.5 mg total) by mouth 3 (three) times daily as needed for muscle spasms. 30 tablet 0  . diazepam (VALIUM) 2 MG tablet Take 1-2 tab daily for anxiety 45 tablet 0  . Diclofenac Sodium (PENNSAID) 2 % SOLN Place 1 application onto the skin 2 (two) times daily. 1 Bottle 3  . FLUoxetine HCl 60 MG TABS Take 60 mg by  mouth daily. 90 tablet 0  . HYDROcodone-acetaminophen (NORCO) 7.5-325 MG tablet Take 1 tablet by mouth every 6 (six) hours as needed for moderate pain. 60 tablet 0  . insulin glargine (LANTUS) 100 UNIT/ML injection Inject 0.2 mLs (20 Units total) into the skin at bedtime. (Patient taking differently: Inject 22 Units into the skin at bedtime. ) 10 mL 11  . Insulin Syringe-Needle U-100 29G X 1/2" 0.3 ML MISC 1 each by Does not apply route 2 (two) times daily. 30 each 0  . isosorbide mononitrate (IMDUR) 30 MG 24 hr tablet TAKE 1 TABLET BY MOUTH ONCE DAILY 30 tablet 3  . lamoTRIgine (LAMICTAL) 200 MG tablet Take 1 tablet (200 mg total) by mouth daily. 90 tablet 0  . nitroGLYCERIN (NITROSTAT) 0.4 MG SL tablet Place 1 tablet (0.4 mg total) under the tongue every 5 (five) minutes as needed for chest pain. 30 tablet 0  . NOVOLIN 70/30 RELION (70-30) 100 UNIT/ML injection INJECT 5 UNITS SUBCUTANEOUSLY TWICE DAILY 10 mL 2  . ondansetron (ZOFRAN) 4 MG tablet Take 1 tablet (4 mg total) by mouth daily as needed. 10 tablet 0  . spironolactone (ALDACTONE) 25 MG tablet Take 1 tablet (25 mg total) by mouth daily. 90 tablet 3   No current facility-administered medications on file prior to visit.  Allergies  Allergen Reactions  . No Known Allergies     Past Medical History:  Diagnosis Date  . Arthralgia of temporomandibular joint   . CAD, multiple vessel    a. cath 06/29/16: ostLM 40%, ostLAD 40%, pLAD 95%, ost-pLCx 60%, pLCx 95%, mLCx 60%, mRCA 95%, D2 50%, LVSF nl;  b. 07/2016 CABG x 4 (LIMA->LAD, VG->Diag, VG->OM, VG->RCA); c. 08/2016 Cath: 3VD w/ 4/4 patent grafts. LAD distal to LIMA has diff dzs->Med rx.  . Carotid arterial disease ([place])    a. 07/2016 s/p R CEA.  . Depression   . Diastolic dysfunction    a. echo 06/28/16: EF 50-55%, mild inf wall HK, GR1DD, mild MR, RV sys fxn nl, mildly dilated LA, PASP nl  . Fatty liver disease, nonalcoholic 6301  . HLD (hyperlipidemia)   . Labile hypertension    a.  prior renal ngiogram negative for RAS in 03/2016; b. catecholamines and metanephrines normal, mildly elevated renin with normal aldosterone and normal ratio in 02/2016  . Obesity   . PTSD (post-traumatic stress disorder)   . Tobacco abuse    a. 2018 - cut back from 2 ppd to 0.5 ppd.  . Type 2 diabetes mellitus [facility]) January 2017    Past Surgical History:  Procedure Laterality Date  . CARDIAC CATHETERIZATION [name] 06/29/2016   Procedure: Left Heart Cath and Coronary Angiography;  Surgeon: Minna Merritts, MD;  Location: [place] [facility];  Service: Cardiovascular;  Laterality: [name];  . CARDIAC CATHETERIZATION [name] 08/29/2016   Procedure: Left Heart Cath and Cors/Grafts Angiography;  Surgeon: Wellington Hampshire, MD;  Location: [place] [facility];  Service: Cardiovascular;  Laterality: [name];  . CESAREAN SECTION    . CHOLECYSTECTOMY    . CORONARY ARTERY BYPASS GRAFT [name] 07/06/2016   Procedure: CORONARY ARTERY BYPASS GRAFTING (CABG) x four, using left internal mammary artery and right leg greater saphenous vein harvested endoscopically;  Surgeon: Ivin Poot, MD;  Location: [place];  Service: Open Heart Surgery;  Laterality: [name];  . ENDARTERECTOMY Right 07/06/2016   Procedure: ENDARTERECTOMY CAROTID;  Surgeon: Rosetta Posner, MD;  Location: [place];  Service: Vascular;  Laterality: Right;  . PERIPHERAL VASCULAR CATHETERIZATION [name] 04/18/2016   Procedure: Renal Angiography;  Surgeon: Wellington Hampshire, MD;  Location: [place] [facility];  Service: Cardiovascular;  Laterality: [name];  . TEE WITHOUT CARDIOVERSION [name] 07/06/2016   Procedure: TRANSESOPHAGEAL ECHOCARDIOGRAM (TEE);  Surgeon: Ivin Poot, MD;  Location: [place];  Service: Open Heart Surgery;  Laterality: [name];  . TONSILLECTOMY      Family History  Adopted: Yes  Problem Relation Age of Onset  . Diabetes Mother   . Diabetes Father   . Alcohol abuse Father   . Heart disease Father   . Drug abuse Father   . Stroke Sister   . Anxiety disorder Sister      Social History   Socioeconomic History  . Marital status: Married    Spouse name: Not on file  . Number of children: Not on file  . Years of education: Not on file  . Highest education level: Not on file  Social Needs  . Financial resource strain: Not on file  . Food insecurity - worry: Not on file  . Food insecurity - inability: Not on file  . Transportation needs - medical: Not on file  . Transportation needs - non-medical: Not on file  Occupational History  . Not on file  Tobacco Use  . Smoking status:  Current Every Day Smoker    Packs/day: 0.25    Types: Cigarettes    Last attempt to quit: 05/23/2017    Years since quitting: 0.7  . Smokeless tobacco: Never Used  . Tobacco comment: patient smokes when she drives.   Substance and Sexual Activity  . Alcohol use: No    Alcohol/week: 0.0 oz  . Drug use: No  . Sexual activity: Yes    Partners: Male    Birth control/protection: None  Other Topics Concern  . Not on file  Social History Narrative  . Not on file   The PMH, PSH, Social History, Family History, Medications, and allergies have been reviewed in [facility], and have been updated if relevant.   Review of Systems  Constitutional: Negative.   HENT: Negative.   Respiratory: Negative.   Cardiovascular: Negative.   Gastrointestinal: Negative.   Endocrine: Negative.   Genitourinary: Negative.   Musculoskeletal: Negative.   Allergic/Immunologic: Negative.   Neurological: Negative.   Hematological: Negative.   Psychiatric/Behavioral: Negative.   All other systems reviewed and are negative.      Objective:    BP (!) 196/106 (BP Location: Left Arm, Patient Position: Sitting, Cuff Size: Normal)   Pulse (!) 101   Temp 98.4 F (36.9 C) (Oral)   Ht 5' 6.25" (1.683 m)   Wt 224 lb 6.4 oz (101.8 kg)   LMP 01/27/2018   SpO2 96%   BMI 35.95 kg/m    Physical Exam   General:  Well-developed,well-nourished,in no acute distress; alert,appropriate and cooperative  throughout examination Head:  normocephalic and atraumatic.   Eyes:  vision grossly intact, PERRL Ears:  R ear normal and L ear normal externally, TMs clear bilaterally Nose:  no external deformity.   Mouth:  good dentition.   Neck:  No deformities, masses, or tenderness noted. Breasts:  No mass, nodules, thickening, tenderness, bulging, retraction, inflamation, nipple discharge or skin changes noted.   Lungs:  Normal respiratory effort, chest expands symmetrically. Lungs are clear to auscultation, no crackles or wheezes. Heart:  Normal rate and regular rhythm. S1 and S2 normal without gallop, murmur, click, rub or other extra sounds. Abdomen:  Bowel sounds positive,abdomen soft and non-tender without masses, organomegaly or hernias noted. Rectal:  no external abnormalities.   Genitalia:  Pelvic Exam:        External: normal female genitalia without lesions or masses        Vagina: normal without lesions or masses        Cervix: normal without lesions or masses        Adnexa: normal bimanual exam without masses or fullness        Uterus: normal by palpation        Pap smear: performed Msk:  No deformity or scoliosis noted of thoracic or lumbar spine.   Extremities:  No clubbing, cyanosis, edema, or deformity noted with normal full range of motion of all joints.   Neurologic:  alert & oriented X3 and gait normal.   Skin:  Intact without suspicious lesions or rashes Cervical Nodes:  No lymphadenopathy noted Axillary Nodes:  No palpable lymphadenopathy Psych:  Cognition and judgment appear intact. Alert and cooperative with normal attention span and concentration. No apparent delusions, illusions, hallucinations      Assessment & Plan:   Well woman exam with routine gynecological exam - Plan: Cytology - PAP No Follow-up on file.

## 2018-02-26 ENCOUNTER — Other Ambulatory Visit (HOSPITAL_COMMUNITY): Payer: Self-pay | Admitting: Psychiatry

## 2018-02-26 ENCOUNTER — Encounter: Payer: Self-pay | Admitting: Family Medicine

## 2018-02-26 ENCOUNTER — Other Ambulatory Visit: Payer: Self-pay

## 2018-02-26 MED ORDER — ROSUVASTATIN CALCIUM 10 MG PO TABS: 10.0000 mg | ORAL_TABLET | Freq: Every day | ORAL | 3 refills | Status: DC

## 2018-02-26 MED ORDER — CLONAZEPAM 0.5 MG PO TABS: 0.5000 mg | ORAL_TABLET | Freq: Three times a day (TID) | ORAL | 0 refills | Status: DC | PRN

## 2018-02-26 MED ORDER — INSULIN GLARGINE 100 UNIT/ML ~~LOC~~ SOLN: 27.0000 [IU] | Freq: Every day | SUBCUTANEOUS | 2 refills | Status: DC

## 2018-02-26 MED ORDER — CARVEDILOL 6.25 MG PO TABS: 6.2500 mg | ORAL_TABLET | Freq: Two times a day (BID) | ORAL | 1 refills | Status: DC

## 2018-02-26 MED ORDER — CYCLOBENZAPRINE HCL 7.5 MG PO TABS: 7.5000 mg | ORAL_TABLET | Freq: Three times a day (TID) | ORAL | 2 refills | Status: DC | PRN

## 2018-02-26 NOTE — Telephone Encounter (Signed)
Discontinue Valium 2 mg.  Restart Klonopin 0.5 mg up to 3 times a day.  We will consider dose adjustment on her next appointment.

## 2018-02-26 NOTE — Telephone Encounter (Signed)
Called in the [place] and called the patient to let her know.

## 2018-02-26 NOTE — Addendum Note (Signed)
Addended by: Lethea Killings on: 02/26/2018 09:42 AM   Modules accepted: Orders

## 2018-02-26 NOTE — Progress Notes (Signed)
Per TA add Crestor 10mg  1qhs sent in #90+3/also to increase Lantus from 22u qhs to 27u qhs/sent 39mL with 2 refills to pharm/sent MyChart message to pt/thx dmf

## 2018-02-26 NOTE — Progress Notes (Signed)
Resent Rx as pharm not rec/thx dmf

## 2018-02-27 LAB — CYTOLOGY - PAP
Bacterial vaginitis: NEGATIVE
Candida vaginitis: NEGATIVE
Chlamydia: NEGATIVE
Diagnosis: NEGATIVE
HPV: NOT DETECTED
Neisseria Gonorrhea: NEGATIVE
Trichomonas: NEGATIVE

## 2018-03-03 DIAGNOSIS — M9903 Segmental and somatic dysfunction of lumbar region: Secondary | ICD-10-CM | POA: Diagnosis not present

## 2018-03-03 DIAGNOSIS — M9901 Segmental and somatic dysfunction of cervical region: Secondary | ICD-10-CM | POA: Diagnosis not present

## 2018-03-03 DIAGNOSIS — M545 Low back pain: Secondary | ICD-10-CM | POA: Diagnosis not present

## 2018-03-03 DIAGNOSIS — M542 Cervicalgia: Secondary | ICD-10-CM | POA: Diagnosis not present

## 2018-03-03 LAB — CERVICOVAGINAL ANCILLARY ONLY: HERPES (WINDOWPATH): NEGATIVE

## 2018-03-05 DIAGNOSIS — G44009 Cluster headache syndrome, unspecified, not intractable: Secondary | ICD-10-CM | POA: Diagnosis not present

## 2018-03-20 DIAGNOSIS — M545 Low back pain: Secondary | ICD-10-CM | POA: Diagnosis not present

## 2018-03-25 ENCOUNTER — Other Ambulatory Visit (HOSPITAL_COMMUNITY): Payer: Self-pay

## 2018-03-25 ENCOUNTER — Ambulatory Visit (INDEPENDENT_AMBULATORY_CARE_PROVIDER_SITE_OTHER): Payer: BLUE CROSS/BLUE SHIELD | Admitting: Family Medicine

## 2018-03-25 ENCOUNTER — Encounter: Payer: Self-pay | Admitting: Family Medicine

## 2018-03-25 VITALS — BP 188/120 | HR 96 | Temp 98.9°F | Ht 65.25 in | Wt 222.4 lb

## 2018-03-25 DIAGNOSIS — E1169 Type 2 diabetes mellitus with other specified complication: Secondary | ICD-10-CM | POA: Diagnosis not present

## 2018-03-25 DIAGNOSIS — I10 Essential (primary) hypertension: Secondary | ICD-10-CM | POA: Diagnosis not present

## 2018-03-25 DIAGNOSIS — F4310 Post-traumatic stress disorder, unspecified: Secondary | ICD-10-CM

## 2018-03-25 DIAGNOSIS — F331 Major depressive disorder, recurrent, moderate: Secondary | ICD-10-CM

## 2018-03-25 LAB — POCT GLYCOSYLATED HEMOGLOBIN (HGB A1C): HEMOGLOBIN A1C: 9.7

## 2018-03-25 MED ORDER — CHLORPROMAZINE HCL 25 MG PO TABS: 25.0000 mg | ORAL_TABLET | Freq: Every day | ORAL | 0 refills | Status: DC

## 2018-03-25 MED ORDER — HYDROCODONE-ACETAMINOPHEN 7.5-325 MG PO TABS: 1.0000 | ORAL_TABLET | Freq: Four times a day (QID) | ORAL | 0 refills | Status: DC | PRN

## 2018-03-25 MED ORDER — FLUOXETINE HCL 60 MG PO TABS: 60.0000 mg | ORAL_TABLET | Freq: Every day | ORAL | 0 refills | Status: DC

## 2018-03-25 MED ORDER — INSULIN GLARGINE 100 UNIT/ML ~~LOC~~ SOLN: 30.0000 [IU] | Freq: Every day | SUBCUTANEOUS | 2 refills | Status: DC

## 2018-03-25 MED ORDER — CARVEDILOL 12.5 MG PO TABS: 12.5000 mg | ORAL_TABLET | Freq: Two times a day (BID) | ORAL | 3 refills | Status: DC

## 2018-03-25 MED ORDER — CLONAZEPAM 0.5 MG PO TABS: 0.5000 mg | ORAL_TABLET | Freq: Three times a day (TID) | ORAL | 0 refills | Status: DC | PRN

## 2018-03-25 MED ORDER — BENZTROPINE MESYLATE 0.5 MG PO TABS: 0.5000 mg | ORAL_TABLET | Freq: Every day | ORAL | 0 refills | Status: DC

## 2018-03-25 NOTE — Assessment & Plan Note (Signed)
Remains poorly controlled. Increase Lantus to 30 units qhs. She will keep a log of fasting FSBS and send me a my chart message with her readings. The patient indicates understanding of these issues and agrees with the plan.

## 2018-03-25 NOTE — Assessment & Plan Note (Signed)
Remains difficult to control. Very labile but under increased stressors.  Reviewed cardiology last note- recommended increasing coreg to 12.5 mg twice daily which is what I have done today. She will check BP at home daily and also keep a log for me. The patient indicates understanding of these issues and agrees with the plan.

## 2018-03-25 NOTE — Progress Notes (Signed)
Subjective:   Patient ID: Erika [name], female    DOB: 1971-09-05, 47 y.o.   MRN: [id number]  KESSA FAIRBAIRN is a pleasant 47 y.o. year old female who presents to clinic today with Follow-up (Patient is here today for a F/U. On 3.5.19 She had wellness and labs completed.  Dr. Deborra Medina increased Lantus to 27 units qhs and added Crestor 10mg  1qd as she is on the highest Lipitor dosage.  She is doing well with the changes. Seems that BP and BS fluctuate with her anxiety level.)  on 03/25/2018  HPI:  DM- increased lantus to 27 units nightly 3 weeks ago. She is still having elevated blood sugars.  Daughter is now on kidney transplant likst and she is very stressed.  BP remains poorly controlled.  She remains asymptomatic with this.    Current Outpatient Medications on File Prior to Visit  Medication Sig Dispense Refill  . albuterol (PROVENTIL HFA;VENTOLIN HFA) 108 (90 Base) MCG/ACT inhaler Inhale 2 puffs into the lungs every 6 (six) hours as needed for wheezing. 1 Inhaler 0  . aspirin EC 81 MG tablet Take 1 tablet (81 mg total) by mouth daily. 90 tablet 3  . atorvastatin (LIPITOR) 80 MG tablet TAKE ONE TABLET BY MOUTH ONCE DAILY AT 6 PM 90 tablet 2  . cyclobenzaprine (FEXMID) 7.5 MG tablet Take 1 tablet (7.5 mg total) by mouth 3 (three) times daily as needed for muscle spasms. 60 tablet 2  . Diclofenac Sodium (PENNSAID) 2 % SOLN Place 1 application onto the skin 2 (two) times daily. 1 Bottle 3  . Insulin Syringe-Needle U-100 29G X 1/2" 0.3 ML MISC 1 each by Does not apply route 2 (two) times daily. 30 each 0  . lamoTRIgine (LAMICTAL) 200 MG tablet Take 1 tablet (200 mg total) by mouth daily. 90 tablet 0  . nitroGLYCERIN (NITROSTAT) 0.4 MG SL tablet Place 1 tablet (0.4 mg total) under the tongue every 5 (five) minutes as needed for chest pain. 30 tablet 0  . NOVOLIN 70/30 RELION (70-30) 100 UNIT/ML injection INJECT 5 UNITS SUBCUTANEOUSLY TWICE DAILY 10 mL 2  . ondansetron (ZOFRAN) 4 MG  tablet Take 1 tablet (4 mg total) by mouth daily as needed. 10 tablet 2  . rosuvastatin (CRESTOR) 10 MG tablet Take 1 tablet (10 mg total) by mouth daily. 90 tablet 3  . [DISCONTINUED] isosorbide mononitrate (IMDUR) 30 MG 24 hr tablet TAKE 1 TABLET BY MOUTH ONCE DAILY 30 tablet 3  . spironolactone (ALDACTONE) 25 MG tablet Take 1 tablet (25 mg total) by mouth daily. 90 tablet 3   No current facility-administered medications on file prior to visit.     Allergies  Allergen Reactions  . No Known Allergies     Past Medical History:  Diagnosis Date  . Arthralgia of temporomandibular joint   . CAD, multiple vessel    a. cath 06/29/16: ostLM 40%, ostLAD 40%, pLAD 95%, ost-pLCx 60%, pLCx 95%, mLCx 60%, mRCA 95%, D2 50%, LVSF nl;  b. 07/2016 CABG x 4 (LIMA->LAD, VG->Diag, VG->OM, VG->RCA); c. 08/2016 Cath: 3VD w/ 4/4 patent grafts. LAD distal to LIMA has diff dzs->Med rx.  . Carotid arterial disease ([place])    a. 07/2016 s/p R CEA.  . Depression   . Diastolic dysfunction    a. echo 06/28/16: EF 50-55%, mild inf wall HK, GR1DD, mild MR, RV sys fxn nl, mildly dilated LA, PASP nl  . Fatty liver disease, nonalcoholic 8563  . HLD (hyperlipidemia)   .  Labile hypertension    a. prior renal ngiogram negative for RAS in 03/2016; b. catecholamines and metanephrines normal, mildly elevated renin with normal aldosterone and normal ratio in 02/2016  . Obesity   . PTSD (post-traumatic stress disorder)   . Tobacco abuse    a. 2018 - cut back from 2 ppd to 0.5 ppd.  . Type 2 diabetes mellitus [facility]) January 2017    Past Surgical History:  Procedure Laterality Date  . CARDIAC CATHETERIZATION [name] 06/29/2016   Procedure: Left Heart Cath and Coronary Angiography;  Surgeon: Minna Merritts, MD;  Location: [place] [facility];  Service: Cardiovascular;  Laterality: [name];  . CARDIAC CATHETERIZATION [name] 08/29/2016   Procedure: Left Heart Cath and Cors/Grafts Angiography;  Surgeon: Wellington Hampshire, MD;  Location: [place]  [facility];  Service: Cardiovascular;  Laterality: [name];  . CESAREAN SECTION    . CHOLECYSTECTOMY    . CORONARY ARTERY BYPASS GRAFT [name] 07/06/2016   Procedure: CORONARY ARTERY BYPASS GRAFTING (CABG) x four, using left internal mammary artery and right leg greater saphenous vein harvested endoscopically;  Surgeon: Ivin Poot, MD;  Location: [place];  Service: Open Heart Surgery;  Laterality: [name];  . ENDARTERECTOMY Right 07/06/2016   Procedure: ENDARTERECTOMY CAROTID;  Surgeon: Rosetta Posner, MD;  Location: [place];  Service: Vascular;  Laterality: Right;  . PERIPHERAL VASCULAR CATHETERIZATION [name] 04/18/2016   Procedure: Renal Angiography;  Surgeon: Wellington Hampshire, MD;  Location: [place] [facility];  Service: Cardiovascular;  Laterality: [name];  . TEE WITHOUT CARDIOVERSION [name] 07/06/2016   Procedure: TRANSESOPHAGEAL ECHOCARDIOGRAM (TEE);  Surgeon: Ivin Poot, MD;  Location: [place];  Service: Open Heart Surgery;  Laterality: [name];  . TONSILLECTOMY      Family History  Adopted: Yes  Problem Relation Age of Onset  . Diabetes Mother   . Diabetes Father   . Alcohol abuse Father   . Heart disease Father   . Drug abuse Father   . Stroke Sister   . Anxiety disorder Sister     Social History   Socioeconomic History  . Marital status: Married    Spouse name: Not on file  . Number of children: Not on file  . Years of education: Not on file  . Highest education level: Not on file  Occupational History  . Not on file  Social Needs  . Financial resource strain: Not on file  . Food insecurity:    Worry: Not on file    Inability: Not on file  . Transportation needs:    Medical: Not on file    Non-medical: Not on file  Tobacco Use  . Smoking status: Current Every Day Smoker    Packs/day: 0.25    Types: Cigarettes    Last attempt to quit: 05/23/2017    Years since quitting: 0.8  . Smokeless tobacco: Never Used  . Tobacco comment: patient smokes when she drives.   Substance and Sexual  Activity  . Alcohol use: No    Alcohol/week: 0.0 oz  . Drug use: No  . Sexual activity: Yes    Partners: Male    Birth control/protection: None  Lifestyle  . Physical activity:    Days per week: Not on file    Minutes per session: Not on file  . Stress: Not on file  Relationships  . Social connections:    Talks on phone: Not on file    Gets together: Not on file    Attends religious service:  Not on file    Active member of club or organization: Not on file    Attends meetings of clubs or organizations: Not on file    Relationship status: Not on file  . Intimate partner violence:    Fear of current or ex partner: Not on file    Emotionally abused: Not on file    Physically abused: Not on file    Forced sexual activity: Not on file  Other Topics Concern  . Not on file  Social History Narrative  . Not on file   The PMH, PSH, Social History, Family History, Medications, and allergies have been reviewed in [facility], and have been updated if relevant.   Review of Systems  Constitutional: Negative.   Respiratory: Negative.   Cardiovascular: Negative.   Musculoskeletal: Negative.   Neurological: Negative.   Hematological: Negative.   All other systems reviewed and are negative.      Objective:    BP (!) 188/120 (BP Location: Left Arm, Patient Position: Sitting, Cuff Size: Normal)   Pulse 96   Temp 98.9 F (37.2 C) (Oral)   Ht 5' 5.25" (1.657 m)   Wt 222 lb 6.4 oz (100.9 kg)   LMP 02/21/2018   SpO2 97%   BMI 36.73 kg/m    Physical Exam  Constitutional: She is oriented to person, place, and time. She appears well-developed and well-nourished. No distress.  HENT:  Head: Normocephalic and atraumatic.  Eyes: Conjunctivae are normal.  Cardiovascular: Normal rate and regular rhythm.  Pulmonary/Chest: Effort normal and breath sounds normal.  Neurological: She is alert and oriented to person, place, and time. No cranial nerve deficit.  Skin: Skin is warm and dry. She is  not diaphoretic.  Psychiatric: She has a normal mood and affect. Her behavior is normal. Judgment and thought content normal.  Nursing note and vitals reviewed.         Assessment & Plan:   Type 2 diabetes mellitus with other specified complication, without long-term current use of insulin (HCC) - Plan: POCT HgB A1C  Essential hypertension, malignant No follow-ups on file.

## 2018-03-25 NOTE — Assessment & Plan Note (Deleted)
Remains poorly controlled.  Increase Lantus to 30 units qhs. She will send me a mychart message with her readings.

## 2018-03-25 NOTE — Patient Instructions (Addendum)
Great to see you. We are increasing your Lantus to 30 units daily.  We are increasing your Coreg to 12 mg twice daily.  Update me with your blood pressure medication.

## 2018-03-31 DIAGNOSIS — M542 Cervicalgia: Secondary | ICD-10-CM | POA: Diagnosis not present

## 2018-03-31 DIAGNOSIS — M545 Low back pain: Secondary | ICD-10-CM | POA: Diagnosis not present

## 2018-03-31 DIAGNOSIS — M9903 Segmental and somatic dysfunction of lumbar region: Secondary | ICD-10-CM | POA: Diagnosis not present

## 2018-03-31 DIAGNOSIS — M9901 Segmental and somatic dysfunction of cervical region: Secondary | ICD-10-CM | POA: Diagnosis not present

## 2018-04-02 ENCOUNTER — Ambulatory Visit: Payer: Self-pay

## 2018-04-05 DIAGNOSIS — G44009 Cluster headache syndrome, unspecified, not intractable: Secondary | ICD-10-CM | POA: Diagnosis not present

## 2018-04-22 ENCOUNTER — Ambulatory Visit (INDEPENDENT_AMBULATORY_CARE_PROVIDER_SITE_OTHER): Payer: BLUE CROSS/BLUE SHIELD | Admitting: Psychiatry

## 2018-04-22 ENCOUNTER — Encounter: Payer: Self-pay | Admitting: Family Medicine

## 2018-04-22 ENCOUNTER — Other Ambulatory Visit: Payer: Self-pay

## 2018-04-22 ENCOUNTER — Encounter (HOSPITAL_COMMUNITY): Payer: Self-pay | Admitting: Psychiatry

## 2018-04-22 DIAGNOSIS — F17210 Nicotine dependence, cigarettes, uncomplicated: Secondary | ICD-10-CM

## 2018-04-22 DIAGNOSIS — R450 Nervousness: Secondary | ICD-10-CM

## 2018-04-22 DIAGNOSIS — Z813 Family history of other psychoactive substance abuse and dependence: Secondary | ICD-10-CM

## 2018-04-22 DIAGNOSIS — R51 Headache: Secondary | ICD-10-CM

## 2018-04-22 DIAGNOSIS — F4310 Post-traumatic stress disorder, unspecified: Secondary | ICD-10-CM

## 2018-04-22 DIAGNOSIS — F331 Major depressive disorder, recurrent, moderate: Secondary | ICD-10-CM | POA: Diagnosis not present

## 2018-04-22 DIAGNOSIS — F419 Anxiety disorder, unspecified: Secondary | ICD-10-CM

## 2018-04-22 DIAGNOSIS — Z811 Family history of alcohol abuse and dependence: Secondary | ICD-10-CM

## 2018-04-22 DIAGNOSIS — F410 Panic disorder [episodic paroxysmal anxiety] without agoraphobia: Secondary | ICD-10-CM

## 2018-04-22 DIAGNOSIS — Z818 Family history of other mental and behavioral disorders: Secondary | ICD-10-CM | POA: Diagnosis not present

## 2018-04-22 MED ORDER — FLUOXETINE HCL 60 MG PO TABS: 60.0000 mg | ORAL_TABLET | Freq: Every day | ORAL | 0 refills | Status: DC

## 2018-04-22 MED ORDER — CLONAZEPAM 0.5 MG PO TABS: 0.5000 mg | ORAL_TABLET | Freq: Four times a day (QID) | ORAL | 2 refills | Status: DC | PRN

## 2018-04-22 MED ORDER — CHLORPROMAZINE HCL 25 MG PO TABS: 25.0000 mg | ORAL_TABLET | Freq: Every day | ORAL | 0 refills | Status: DC

## 2018-04-22 MED ORDER — HYDROCODONE-ACETAMINOPHEN 7.5-325 MG PO TABS: 1.0000 | ORAL_TABLET | Freq: Four times a day (QID) | ORAL | 0 refills | Status: DC | PRN

## 2018-04-22 MED ORDER — LAMOTRIGINE 200 MG PO TABS: 200.0000 mg | ORAL_TABLET | Freq: Every day | ORAL | 0 refills | Status: DC

## 2018-04-22 MED ORDER — BENZTROPINE MESYLATE 0.5 MG PO TABS: 0.5000 mg | ORAL_TABLET | Freq: Every day | ORAL | 0 refills | Status: DC

## 2018-04-22 NOTE — Progress Notes (Signed)
Per TA printed May Hydrocodone-APAP after looking up on PMP/thx dmf
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
## 2018-05-02 ENCOUNTER — Telehealth: Payer: Self-pay | Admitting: Physician Assistant

## 2018-05-02 NOTE — Telephone Encounter (Signed)
Attempted to call both numbers on file, both numbers are out of service Needed to reschedule Monday 05/05/18 appointment Change In schedule  Will cancel apportionment and await for patient to call and reschedule

## 2018-05-05 ENCOUNTER — Ambulatory Visit: Payer: Self-pay | Admitting: Physician Assistant

## 2018-05-05 DIAGNOSIS — G44009 Cluster headache syndrome, unspecified, not intractable: Secondary | ICD-10-CM | POA: Diagnosis not present

## 2018-05-23 ENCOUNTER — Encounter: Payer: Self-pay | Admitting: Family Medicine

## 2018-05-26 ENCOUNTER — Other Ambulatory Visit: Payer: Self-pay

## 2018-05-26 MED ORDER — HYDROCODONE-ACETAMINOPHEN 7.5-325 MG PO TABS: 1.0000 | ORAL_TABLET | Freq: Four times a day (QID) | ORAL | 0 refills | Status: DC | PRN

## 2018-05-29 ENCOUNTER — Encounter: Payer: Self-pay | Admitting: Family Medicine

## 2018-05-29 ENCOUNTER — Telehealth: Payer: Self-pay | Admitting: Family Medicine

## 2018-05-29 NOTE — Telephone Encounter (Signed)
Pharmacy stated Erika [name] is taking Norco,clonazepam and cyclobenzaprine and this can ready to respiratory depression. recommendation from pharmacy is to change the muscle relaxer to methocarbamol. Please advise.

## 2018-05-29 NOTE — Telephone Encounter (Signed)
Copied from [place] [phone]. Topic: Quick Communication - See Telephone Encounter >> May 29, 2018 10:30 AM Percell Belt A wrote: CRM for notification. See Telephone encounter for: 05/29/18. Pharmacy called in and stated that they have a drug interaction for this pt and needs to speak with CMA to clarify?    [facility] on [address]  [phone]

## 2018-05-29 NOTE — Telephone Encounter (Signed)
We have discussed this interaction (over sedation, respiratory depression) in past and patient is aware and willing to take rxs cautiously knowing the risk if taken together. Given her other medical conditions, she had wanted to continue these rxs rather than changing rxs but has been aware to use sparingly and with caution. Please call her to let her know that pharmacy called about this warning and make sure she is still okay with taking them.  Please also ask her how her daughter is doing.

## 2018-05-30 NOTE — Telephone Encounter (Signed)
Spoke with the pt, she wants to continue the same plan, no changes and aware of interactions.   Daughter is doing good, everything going the way they wanted it.   Spoke with [facility] rep, advise her no changes to med at this time.

## 2018-05-30 NOTE — Telephone Encounter (Signed)
Thank you :)

## 2018-06-05 DIAGNOSIS — G44009 Cluster headache syndrome, unspecified, not intractable: Secondary | ICD-10-CM | POA: Diagnosis not present

## 2018-06-08 ENCOUNTER — Encounter: Payer: Self-pay | Admitting: Family Medicine

## 2018-06-24 ENCOUNTER — Encounter: Payer: Self-pay | Admitting: Family Medicine

## 2018-06-24 ENCOUNTER — Other Ambulatory Visit: Payer: Self-pay

## 2018-06-24 ENCOUNTER — Ambulatory Visit: Payer: BLUE CROSS/BLUE SHIELD | Admitting: Family Medicine

## 2018-06-24 VITALS — BP 166/106 | HR 92 | Temp 98.7°F | Ht 67.0 in | Wt 218.8 lb

## 2018-06-24 DIAGNOSIS — M791 Myalgia, unspecified site: Secondary | ICD-10-CM | POA: Diagnosis not present

## 2018-06-24 DIAGNOSIS — E1169 Type 2 diabetes mellitus with other specified complication: Secondary | ICD-10-CM

## 2018-06-24 DIAGNOSIS — I739 Peripheral vascular disease, unspecified: Secondary | ICD-10-CM | POA: Insufficient documentation

## 2018-06-24 LAB — COMPREHENSIVE METABOLIC PANEL
ALK PHOS: 71 U/L (ref 39–117)
ALT: 18 U/L (ref 0–35)
AST: 15 U/L (ref 0–37)
Albumin: 4.2 g/dL (ref 3.5–5.2)
BUN: 10 mg/dL (ref 6–23)
CALCIUM: 10 mg/dL (ref 8.4–10.5)
CO2: 26 mEq/L (ref 19–32)
Chloride: 99 mEq/L (ref 96–112)
Creatinine, Ser: 0.59 mg/dL (ref 0.40–1.20)
GFR: 117.39 mL/min (ref >60.00)
GLUCOSE: 216 mg/dL — AB (ref 70–99)
POTASSIUM: 4.1 meq/L (ref 3.5–5.1)
Sodium: 133 mEq/L — ABNORMAL LOW (ref 135–145)
TOTAL PROTEIN: 7.5 g/dL (ref 6.0–8.3)
Total Bilirubin: 0.4 mg/dL (ref 0.2–1.2)

## 2018-06-24 LAB — HEMOGLOBIN A1C: Hgb A1c MFr Bld: 10.4 % — ABNORMAL HIGH (ref 4.6–6.5)

## 2018-06-24 LAB — CK: CK TOTAL: 40 U/L (ref 7–177)

## 2018-06-24 MED ORDER — HYDROCODONE-ACETAMINOPHEN 7.5-325 MG PO TABS: 1.0000 | ORAL_TABLET | Freq: Four times a day (QID) | ORAL | 0 refills | Status: DC | PRN

## 2018-06-24 MED ORDER — ONDANSETRON HCL 4 MG PO TABS: 4.0000 mg | ORAL_TABLET | Freq: Every day | ORAL | 2 refills | Status: DC | PRN

## 2018-06-24 MED ORDER — CYCLOBENZAPRINE HCL 7.5 MG PO TABS: 7.5000 mg | ORAL_TABLET | Freq: Three times a day (TID) | ORAL | 2 refills | Status: DC | PRN

## 2018-06-24 NOTE — Progress Notes (Signed)
Subjective:   Patient ID: Erika [name], female    DOB: [birth year], 47 y.o.   MRN: [id number]  Erika [name] is a pleasant 47 y.o. year old female who presents to clinic today with Follow-up (myalgias)  on 06/24/2018  HPI:  Myalgias- persistent issue.  She feels now it impacting more than her hips now.  Feels her thighs hurt and ankles as well.  Saw Dr. Raeford Razor for this on 12/27/17. Note reviewed.  His assessment and plan was as follows:   Chronic pain of both ankles It appears that she has collapse of the longitudinal arch when she ambulates.  There is more of a subtalar shift on the left as compared to the right.  Posterior tib function is still strong.  Has pronation upon ambulation as well.  Does not have overt hypermobility but could be contributing especially if she has chronic pain. -Counseled on adding support in the way of an insole.  Could consider custom orthotics. -Pennsaid provided -If no improvement of follow-up could consider ultrasound.  Could consider nitro  Tendinopathy of left gluteus medius Left seems to be worse than the right.  Significant weakness with hip abduction.  Likely had muscle mass loss when she was hospitalized during her myocardial infarction and CABG.  Does not appear to be coming from her back.  Likely has an underlying component of greater trochanteric bursitis. -Prednisone -Counseled on hip abduction exercises -If no improvement could consider an injection and/or physical therapy.  Tendinopathy of right gluteus medius Weakness with hip abduction. -Counseled on home exercises -Prednisone -If no improvement could consider injection versus physical therapy.  She did not follow up with him.  She is under a lot of stress due to her daughter's failing health.  She is following up with her psychiatrist regularly.  Current Outpatient Medications on File Prior to Visit  Medication Sig Dispense Refill  . albuterol (PROVENTIL  HFA;VENTOLIN HFA) 108 (90 Base) MCG/ACT inhaler Inhale 2 puffs into the lungs every 6 (six) hours as needed for wheezing. 1 Inhaler 0  . aspirin EC 81 MG tablet Take 1 tablet (81 mg total) by mouth daily. 90 tablet 3  . atorvastatin (LIPITOR) 80 MG tablet TAKE ONE TABLET BY MOUTH ONCE DAILY AT 6 PM 90 tablet 2  . benztropine (COGENTIN) 0.5 MG tablet Take 1 tablet (0.5 mg total) by mouth at bedtime. 90 tablet 0  . carvedilol (COREG) 12.5 MG tablet Take 1 tablet (12.5 mg total) by mouth 2 (two) times daily with a meal. 60 tablet 3  . chlorproMAZINE (THORAZINE) 25 MG tablet Take 1 tablet (25 mg total) by mouth at bedtime. 90 tablet 0  . clonazePAM (KLONOPIN) 0.5 MG tablet Take 1 tablet (0.5 mg total) by mouth 4 (four) times daily as needed for anxiety. 120 tablet 2  . Diclofenac Sodium (PENNSAID) 2 % SOLN Place 1 application onto the skin 2 (two) times daily. 1 Bottle 3  . FLUoxetine HCl 60 MG TABS Take 60 mg by mouth daily. 90 tablet 0  . insulin glargine (LANTUS) 100 UNIT/ML injection Inject 0.3 mLs (30 Units total) into the skin at bedtime. 10 mL 2  . Insulin Syringe-Needle U-100 29G X 1/2" 0.3 ML MISC 1 each by Does not apply route 2 (two) times daily. 30 each 0  . lamoTRIgine (LAMICTAL) 200 MG tablet Take 1 tablet (200 mg total) by mouth daily. 90 tablet 0  . nitroGLYCERIN (NITROSTAT) 0.4 MG SL tablet Place 1 tablet (0.4 mg total)  under the tongue every 5 (five) minutes as needed for chest pain. 30 tablet 0  . NOVOLIN 70/30 RELION (70-30) 100 UNIT/ML injection INJECT 5 UNITS SUBCUTANEOUSLY TWICE DAILY 10 mL 2  . rosuvastatin (CRESTOR) 10 MG tablet Take 1 tablet (10 mg total) by mouth daily. 90 tablet 3  . spironolactone (ALDACTONE) 25 MG tablet Take 1 tablet (25 mg total) by mouth daily. 90 tablet 3  . [DISCONTINUED] isosorbide mononitrate (IMDUR) 30 MG 24 hr tablet TAKE 1 TABLET BY MOUTH ONCE DAILY 30 tablet 3   No current facility-administered medications on file prior to visit.      Allergies  Allergen Reactions  . No Known Allergies     Past Medical History:  Diagnosis Date  . Arthralgia of temporomandibular joint   . CAD, multiple vessel    a. cath 06/29/16: ostLM 40%, ostLAD 40%, pLAD 95%, ost-pLCx 60%, pLCx 95%, mLCx 60%, mRCA 95%, D2 50%, LVSF nl;  b. 07/2016 CABG x 4 (LIMA->LAD, VG->Diag, VG->OM, VG->RCA); c. 08/2016 Cath: 3VD w/ 4/4 patent grafts. LAD distal to LIMA has diff dzs->Med rx.  . Carotid arterial disease ([place])    a. 07/2016 s/p R CEA.  . Depression   . Diastolic dysfunction    a. echo 06/28/16: EF 50-55%, mild inf wall HK, GR1DD, mild MR, RV sys fxn nl, mildly dilated LA, PASP nl  . Fatty liver disease, nonalcoholic 2993  . HLD (hyperlipidemia)   . Labile hypertension    a. prior renal ngiogram negative for RAS in 03/2016; b. catecholamines and metanephrines normal, mildly elevated renin with normal aldosterone and normal ratio in 02/2016  . Obesity   . PTSD (post-traumatic stress disorder)   . Tobacco abuse    a. 2018 - cut back from 2 ppd to 0.5 ppd.  . Type 2 diabetes mellitus [facility]) January 2017    Past Surgical History:  Procedure Laterality Date  . CARDIAC CATHETERIZATION [name] 06/29/2016   Procedure: Left Heart Cath and Coronary Angiography;  Surgeon: Minna Merritts, MD;  Location: [place] CV [facility];  Service: Cardiovascular;  Laterality: [name];  . CARDIAC CATHETERIZATION [name] 08/29/2016   Procedure: Left Heart Cath and Cors/Grafts Angiography;  Surgeon: Wellington Hampshire, MD;  Location: [place] [facility];  Service: Cardiovascular;  Laterality: [name];  . CESAREAN SECTION    . CHOLECYSTECTOMY    . CORONARY ARTERY BYPASS GRAFT [name] 07/06/2016   Procedure: CORONARY ARTERY BYPASS GRAFTING (CABG) x four, using left internal mammary artery and right leg greater saphenous vein harvested endoscopically;  Surgeon: Ivin Poot, MD;  Location: [place];  Service: Open Heart Surgery;  Laterality: [name];  . ENDARTERECTOMY Right 07/06/2016   Procedure:  ENDARTERECTOMY CAROTID;  Surgeon: Rosetta Posner, MD;  Location: [place];  Service: Vascular;  Laterality: Right;  . PERIPHERAL VASCULAR CATHETERIZATION [name] 04/18/2016   Procedure: Renal Angiography;  Surgeon: Wellington Hampshire, MD;  Location: [place] [facility];  Service: Cardiovascular;  Laterality: [name];  . TEE WITHOUT CARDIOVERSION [name] 07/06/2016   Procedure: TRANSESOPHAGEAL ECHOCARDIOGRAM (TEE);  Surgeon: Ivin Poot, MD;  Location: [place];  Service: Open Heart Surgery;  Laterality: [name];  . TONSILLECTOMY      Family History  Adopted: Yes  Problem Relation Age of Onset  . Diabetes Mother   . Diabetes Father   . Alcohol abuse Father   . Heart disease Father   . Drug abuse Father   . Stroke Sister   . Anxiety disorder Sister  Social History   Socioeconomic History  . Marital status: Married    Spouse name: Not on file  . Number of children: Not on file  . Years of education: Not on file  . Highest education level: Not on file  Occupational History  . Not on file  Social Needs  . Financial resource strain: Not on file  . Food insecurity:    Worry: Not on file    Inability: Not on file  . Transportation needs:    Medical: Not on file    Non-medical: Not on file  Tobacco Use  . Smoking status: Current Every Day Smoker    Packs/day: 0.25    Types: Cigarettes    Last attempt to quit: 05/23/2017    Years since quitting: 1.0  . Smokeless tobacco: Never Used  . Tobacco comment: patient smokes when she drives.   Substance and Sexual Activity  . Alcohol use: No    Alcohol/week: 0.0 oz  . Drug use: No  . Sexual activity: Yes    Partners: Male    Birth control/protection: None  Lifestyle  . Physical activity:    Days per week: Not on file    Minutes per session: Not on file  . Stress: Not on file  Relationships  . Social connections:    Talks on phone: Not on file    Gets together: Not on file    Attends religious service: Not on file    Active member of club or  organization: Not on file    Attends meetings of clubs or organizations: Not on file    Relationship status: Not on file  . Intimate partner violence:    Fear of current or ex partner: Not on file    Emotionally abused: Not on file    Physically abused: Not on file    Forced sexual activity: Not on file  Other Topics Concern  . Not on file  Social History Narrative  . Not on file   The PMH, PSH, Social History, Family History, Medications, and allergies have been reviewed in [facility], and have been updated if relevant.   Review of Systems  Musculoskeletal: Positive for arthralgias and myalgias.  All other systems reviewed and are negative.      Objective:    BP (!) 166/106 (BP Location: Left Arm, Cuff Size: Normal)   Pulse 92   Temp 98.7 F (37.1 C) (Oral)   Ht 5\' 7"  (1.702 m)   Wt 218 lb 12.8 oz (99.2 kg)   LMP 06/01/2018   SpO2 96%   BMI 34.27 kg/m   BP Readings from Last 3 Encounters:  06/24/18 (!) 166/106  03/25/18 (!) 188/120  02/25/18 (!) 196/106    Physical Exam  Constitutional: She is oriented to person, place, and time. She appears well-developed and well-nourished. No distress.  HENT:  Head: Normocephalic and atraumatic.  Eyes: EOM are normal.  Neck: Normal range of motion.  Cardiovascular: Normal rate.  Pulmonary/Chest: Effort normal.  Musculoskeletal: She exhibits no edema.  Neurological: She is alert and oriented to person, place, and time. No cranial nerve deficit.  Skin: Skin is warm and dry. She is not diaphoretic.  Psychiatric: She has a normal mood and affect. Her behavior is normal. Judgment and thought content normal.  Nursing note and vitals reviewed.         Assessment & Plan:   Myalgia - Plan: CK (Creatine Kinase)  Type 2 diabetes mellitus with other specified complication, without long-term  current use of insulin ([place]) - Plan: Hemoglobin A1c, Comprehensive metabolic panel No follow-ups on file.

## 2018-06-24 NOTE — Patient Instructions (Signed)
Great to see you. I will call you with your lab results from today and you can view them online.   

## 2018-06-25 NOTE — Assessment & Plan Note (Signed)
Discussed Dr. Doristine Locks follow up recommendations.  She would like to look into other options as well.  Check a CK today- has been on high dose lipitor since MI.  ? neuo referral for EMG. The patient indicates understanding of these issues and agrees with the plan.

## 2018-06-26 ENCOUNTER — Other Ambulatory Visit: Payer: Self-pay | Admitting: Family Medicine

## 2018-06-26 ENCOUNTER — Other Ambulatory Visit: Payer: Self-pay | Admitting: Cardiovascular Disease

## 2018-06-27 ENCOUNTER — Other Ambulatory Visit: Payer: Self-pay | Admitting: *Deleted

## 2018-06-27 MED ORDER — SPIRONOLACTONE 25 MG PO TABS: 25.0000 mg | ORAL_TABLET | Freq: Every day | ORAL | 1 refills | Status: DC

## 2018-06-27 MED ORDER — NITROGLYCERIN 0.4 MG SL SUBL: 0.4000 mg | SUBLINGUAL_TABLET | SUBLINGUAL | 0 refills | Status: DC | PRN

## 2018-06-30 ENCOUNTER — Other Ambulatory Visit: Payer: Self-pay

## 2018-06-30 ENCOUNTER — Emergency Department: Payer: BLUE CROSS/BLUE SHIELD

## 2018-06-30 ENCOUNTER — Emergency Department
Admission: EM | Admit: 2018-06-30 | Discharge: 2018-07-01 | Disposition: A | Discharge status: Home / Self Care | Payer: BLUE CROSS/BLUE SHIELD | Attending: Emergency Medicine | Admitting: Emergency Medicine

## 2018-06-30 ENCOUNTER — Ambulatory Visit: Payer: Self-pay | Admitting: *Deleted

## 2018-06-30 DIAGNOSIS — Z794 Long term (current) use of insulin: Secondary | ICD-10-CM | POA: Insufficient documentation

## 2018-06-30 DIAGNOSIS — Z79899 Other long term (current) drug therapy: Secondary | ICD-10-CM | POA: Diagnosis not present

## 2018-06-30 DIAGNOSIS — I5030 Unspecified diastolic (congestive) heart failure: Secondary | ICD-10-CM | POA: Diagnosis not present

## 2018-06-30 DIAGNOSIS — Z7982 Long term (current) use of aspirin: Secondary | ICD-10-CM | POA: Insufficient documentation

## 2018-06-30 DIAGNOSIS — R739 Hyperglycemia, unspecified: Secondary | ICD-10-CM

## 2018-06-30 DIAGNOSIS — I252 Old myocardial infarction: Secondary | ICD-10-CM | POA: Diagnosis not present

## 2018-06-30 DIAGNOSIS — Z951 Presence of aortocoronary bypass graft: Secondary | ICD-10-CM | POA: Diagnosis not present

## 2018-06-30 DIAGNOSIS — R600 Localized edema: Secondary | ICD-10-CM | POA: Diagnosis not present

## 2018-06-30 DIAGNOSIS — I2510 Atherosclerotic heart disease of native coronary artery without angina pectoris: Secondary | ICD-10-CM | POA: Insufficient documentation

## 2018-06-30 DIAGNOSIS — M7989 Other specified soft tissue disorders: Secondary | ICD-10-CM | POA: Diagnosis not present

## 2018-06-30 DIAGNOSIS — F17210 Nicotine dependence, cigarettes, uncomplicated: Secondary | ICD-10-CM | POA: Insufficient documentation

## 2018-06-30 DIAGNOSIS — I110 Hypertensive heart disease with heart failure: Secondary | ICD-10-CM | POA: Diagnosis not present

## 2018-06-30 DIAGNOSIS — E1165 Type 2 diabetes mellitus with hyperglycemia: Secondary | ICD-10-CM | POA: Diagnosis not present

## 2018-06-30 LAB — BASIC METABOLIC PANEL
Anion gap: 8 (ref 5–15)
BUN: 10 mg/dL (ref 6–20)
CHLORIDE: 101 mmol/L (ref 98–111)
CO2: 28 mmol/L (ref 22–32)
Calcium: 9.9 mg/dL (ref 8.9–10.3)
Creatinine, Ser: 0.57 mg/dL (ref 0.44–1.00)
GFR calc non Af Amer: >60 mL/min (ref >60)
GLUCOSE: 207 mg/dL — AB (ref 70–99)
Potassium: 3.9 mmol/L (ref 3.5–5.1)
Sodium: 137 mmol/L (ref 135–145)

## 2018-06-30 LAB — CBC
HEMATOCRIT: 42 % (ref 35.0–47.0)
HEMOGLOBIN: 14.3 g/dL (ref 12.0–16.0)
MCH: 30 pg (ref 26.0–34.0)
MCHC: 34.1 g/dL (ref 32.0–36.0)
MCV: 88.1 fL (ref 80.0–100.0)
Platelets: 318 10*3/uL (ref 150–440)
RBC: 4.77 MIL/uL (ref 3.80–5.20)
RDW: 13.6 % (ref 11.5–14.5)
WBC: 11.8 10*3/uL — ABNORMAL HIGH (ref 3.6–11.0)

## 2018-06-30 LAB — BRAIN NATRIURETIC PEPTIDE: B Natriuretic Peptide: 21 pg/mL (ref 0.0–100.0)

## 2018-06-30 NOTE — ED Notes (Signed)
Pt denies CP. Pt has BLE edema +2. Pt stating it started suddenly around 1530 today. Pt stating she was sitting for about 10 minutes and stood up and her feet began to burn. Pt denies any long trips. Pt in NAD and denies SOB

## 2018-06-30 NOTE — ED Provider Notes (Addendum)
[facility] Emergency Department Provider Note  ____________________________________________  Time seen: Approximately 10:34 PM  I have reviewed the triage vital signs and the nursing notes.   HISTORY  Chief Complaint Leg Swelling    HPI Erika [name] is a 47 y.o. female history of CAD status post CABG, carotid artery disease status post CEA, malignant hypertension, presenting with acute bilateral lower extremity edema.  The patient reports that she was in her usual state of health when at 3:30 PM, she stood up in her home and immediately felt a burning sensation in her feet "like a skin was going to explode."  She looked down and saw an acute bilateral symmetric lower extremity edema to the distal tibial shaft.  Since then, the swelling has significantly improved but she now feels like her calves are swollen and tight.  Her pain is better with walking and moving around. She denies any chest pain, shortness of breath.  She does describe some chronic and progressively worsening exercise intolerance.  She also reports that she occasionally does have diffuse leg cramping and pain with ambulation that has been worked up by her PMD and not found to be attributable to her statin.  Past Medical History:  Diagnosis Date  . Arthralgia of temporomandibular joint   . CAD, multiple vessel    a. cath 06/29/16: ostLM 40%, ostLAD 40%, pLAD 95%, ost-pLCx 60%, pLCx 95%, mLCx 60%, mRCA 95%, D2 50%, LVSF nl;  b. 07/2016 CABG x 4 (LIMA->LAD, VG->Diag, VG->OM, VG->RCA); c. 08/2016 Cath: 3VD w/ 4/4 patent grafts. LAD distal to LIMA has diff dzs->Med rx.  . Carotid arterial disease ([place])    a. 07/2016 s/p R CEA.  . Depression   . Diastolic dysfunction    a. echo 06/28/16: EF 50-55%, mild inf wall HK, GR1DD, mild MR, RV sys fxn nl, mildly dilated LA, PASP nl  . Fatty liver disease, nonalcoholic 5427  . HLD (hyperlipidemia)   . Labile hypertension    a. prior renal ngiogram negative for  RAS in 03/2016; b. catecholamines and metanephrines normal, mildly elevated renin with normal aldosterone and normal ratio in 02/2016  . Obesity   . PTSD (post-traumatic stress disorder)   . Tobacco abuse    a. 2018 - cut back from 2 ppd to 0.5 ppd.  . Type 2 diabetes mellitus [facility]) January 2017    Patient Active Problem List   Diagnosis Date Noted  . Myalgia 06/24/2018  . Well woman exam with routine gynecological exam 02/25/2018  . Chronic pain of both ankles 12/27/2017  . Tendinopathy of right gluteus medius 12/27/2017  . Tendinopathy of left gluteus medius 12/27/2017  . Bilateral hip pain 12/26/2017  . Other chronic pain 10/17/2017  . Elevated troponin I level 05/21/2017  . Sepsis ([place]) 05/19/2017  . Hyperosmolar non-ketotic state in patient with type 2 diabetes mellitus ([place]) 05/19/2017  . Major depressive disorder, recurrent episode, moderate ([place]) 11/19/2016  . Insomnia 10/30/2016  . Hypertension, accelerated with heart disease, without CHF   . Constipation 07/25/2016  . S/P CABG x 4 07/06/2016  . Bradycardia   . CAD (coronary artery disease)   . Carotid stenosis   . CAD in native artery 06/29/2016  . Elevated troponin 06/28/2016  . Essential hypertension, malignant 06/28/2016  . Tobacco abuse 06/28/2016  . Essential hypertension   . Malignant hypertension   . Diabetes ([place])   . Chest pain with high risk for cardiac etiology 06/27/2016  . NSTEMI (non-ST elevated myocardial infarction) (  [place]) 06/27/2016  . Proteinuria 03/15/2016  . Renal artery stenosis ([place]) 03/15/2016  . MDD (major depressive disorder) 10/17/2015  . Agoraphobia with panic attacks 04/25/2015  . HTN (hypertension), malignant 10/20/2013  . Cluster headache 03/20/2012  . HLD (hyperlipidemia) 07/26/2010  . ADJUSTMENT DISORDER WITH MIXED FEATURES 07/26/2010    Past Surgical History:  Procedure Laterality Date  . CARDIAC CATHETERIZATION [name] 06/29/2016   Procedure: Left Heart Cath and Coronary  Angiography;  Surgeon: Minna Merritts, MD;  Location: [place] CV LAB;  Service: Cardiovascular;  Laterality: [name];  . CARDIAC CATHETERIZATION [name] 08/29/2016   Procedure: Left Heart Cath and Cors/Grafts Angiography;  Surgeon: Wellington Hampshire, MD;  Location: [place] CV LAB;  Service: Cardiovascular;  Laterality: [name];  . CESAREAN SECTION    . CHOLECYSTECTOMY    . CORONARY ARTERY BYPASS GRAFT [name] 07/06/2016   Procedure: CORONARY ARTERY BYPASS GRAFTING (CABG) x four, using left internal mammary artery and right leg greater saphenous vein harvested endoscopically;  Surgeon: Ivin Poot, MD;  Location: [place];  Service: Open Heart Surgery;  Laterality: [name];  . ENDARTERECTOMY Right 07/06/2016   Procedure: ENDARTERECTOMY CAROTID;  Surgeon: Rosetta Posner, MD;  Location: [place];  Service: Vascular;  Laterality: Right;  . PERIPHERAL VASCULAR CATHETERIZATION [name] 04/18/2016   Procedure: Renal Angiography;  Surgeon: Wellington Hampshire, MD;  Location: [place] CV LAB;  Service: Cardiovascular;  Laterality: [name];  . TEE WITHOUT CARDIOVERSION [name] 07/06/2016   Procedure: TRANSESOPHAGEAL ECHOCARDIOGRAM (TEE);  Surgeon: Ivin Poot, MD;  Location: [place];  Service: Open Heart Surgery;  Laterality: [name];  . TONSILLECTOMY      Current Outpatient Rx  . Order #: [id number] Class: Normal  . Order #: [id number] Class: No Print  . Order #: [id number] Class: Normal  . Order #: [id number] Class: Normal  . Order #: [id number] Class: Normal  . Order #: [id number] Class: Normal  . Order #: [id number] Class: Print  . Order #: [id number] Class: Normal  . Order #: [id number] Class: Normal  . Order #: [id number] Class: Normal  . Order #: [id number] Class: Print  . Order #: [id number] Class: Normal  . Order #: [id number] Class: Normal  . Order #: [id number] Class: Normal  . Order #: [id number] Class: Normal  . Order #: [id number] Class: Normal  . Order #: [id number] Class: Normal  . Order #: [id number] Class: Normal  . Order #: [id number] Class:  Normal    Allergies No known allergies  Family History  Adopted: Yes  Problem Relation Age of Onset  . Diabetes Mother   . Diabetes Father   . Alcohol abuse Father   . Heart disease Father   . Drug abuse Father   . Stroke Sister   . Anxiety disorder Sister     Social History Social History   Tobacco Use  . Smoking status: Current Every Day Smoker    Packs/day: 0.25    Types: Cigarettes    Last attempt to quit: 05/23/2017    Years since quitting: 1.1  . Smokeless tobacco: Never Used  . Tobacco comment: patient smokes when she drives.   Substance Use Topics  . Alcohol use: No    Alcohol/week: 0.0 oz  . Drug use: No    Review of Systems Constitutional: No fever/chills.  No lightheadedness or syncope. Eyes: No visual changes. ENT:  No congestion or rhinorrhea. Cardiovascular: Denies chest pain. Denies palpitations. Respiratory: Denies shortness of breath.  No cough. Gastrointestinal: No abdominal pain.  No nausea, no vomiting.  No diarrhea.  No constipation. Genitourinary: Negative for dysuria. Musculoskeletal: Negative for  back pain.  Positive acute and resolving lower extremity swelling.  Positive burning sensation in the bilateral feet. Skin: Negative for rash. Neurological: Negative for headaches. No focal numbness, tingling or weakness.     ____________________________________________   PHYSICAL EXAM:  VITAL SIGNS: ED Triage Vitals  Enc Vitals Group     BP 06/30/18 1701 (!) 173/89     Pulse Rate 06/30/18 1701 95     Resp 06/30/18 1701 20     Temp 06/30/18 1701 98.3 F (36.8 C)     Temp Source 06/30/18 1701 Oral     SpO2 06/30/18 1701 95 %     Weight 06/30/18 1659 218 lb (98.9 kg)     Height 06/30/18 1659 5\' 7"  (1.702 m)     Head Circumference --      Peak Flow --      Pain Score 06/30/18 1659 6     Pain Loc --      Pain Edu? --      Excl. in [place]? --     Constitutional: Alert and oriented.  Answers questions appropriately.  Chronically  ill-appearing. Eyes: Conjunctivae are normal.  EOMI. No scleral icterus. Head: Atraumatic. Nose: No congestion/rhinnorhea. Mouth/Throat: Mucous membranes are moist.  Neck: No stridor.  Supple.  No JVD.  No meningismus. Cardiovascular: Normal rate, regular rhythm. No murmurs, rubs or gallops.  Respiratory: Normal respiratory effort.  No accessory muscle use or retractions. Lungs CTAB.  No wheezes, rales or ronchi. Gastrointestinal: Overweight.  Soft, nontender and nondistended.  No guarding or rebound.  No peritoneal signs. Musculoskeletal: Notes symmetric bilateral lower extremity edema with minimal pitting.  Tenderness to palpation in the right calf without palpable cord.  Negative Homans sign.  Normal DP and PT pulses bilaterally. Neurologic:  A&Ox3.  Speech is clear.  Face and smile are symmetric.  EOMI.  Moves all extremities well. Skin:  Skin is warm, dry and intact. No rash noted. Psychiatric: Mood and affect are normal. Speech and behavior are normal.  Normal judgement.  ____________________________________________   LABS (all labs ordered are listed, but only abnormal results are displayed)  Labs Reviewed  BASIC METABOLIC PANEL - Abnormal; Notable for the following components:      Result Value   Glucose, Bld 207 (*)    All other components within normal limits  CBC - Abnormal; Notable for the following components:   WBC 11.8 (*)    All other components within normal limits  BRAIN NATRIURETIC PEPTIDE  POC URINE PREG, ED   ____________________________________________  EKG  ED ECG REPORT I, Eula Listen, the attending physician, personally viewed and interpreted this ECG.   Date: 06/30/2018  EKG Time: 2257  Rate: 84  Rhythm: normal sinus rhythm  Axis: normal  Intervals:none  ST&T Change: no STEMI  EKG is compared to 1/19 and is grossly unchanged in morphology.  ____________________________________________  [id number]  Dg Chest 2 View  Result Date:  06/30/2018 CLINICAL DATA:  Lower extremity edema.  History of CABG. EXAM: CHEST - 2 VIEW COMPARISON:  Chest radiograph December 28, 2017 FINDINGS: Cardiomediastinal silhouette is normal, though remains somewhat rotated to the RIGHT. Status post median sternotomy for CABG. No pleural effusions or focal consolidations. Trachea projects midline and there is no pneumothorax. Soft tissue planes and included osseous structures are non-suspicious. IMPRESSION: 1. No acute cardiopulmonary process. Electronically Signed   By: Elon Alas M.D.   On: 06/30/2018 22:49    ____________________________________________   PROCEDURES  Procedure(s) performed: None  Procedures  Critical Care performed: No ____________________________________________   INITIAL IMPRESSION / ASSESSMENT AND PLAN / ED COURSE  Pertinent labs & imaging results that were available during my care of the patient were reviewed by me and considered in my medical decision making (see chart for details).  47 y.o. female with a strong history of artery disease presenting with acute onset of bilateral lower extremity edema, now improving.  The rapidity of onset and improvement makes this episode perplexing.  The patient's renal function is reassuring.  BNP and chest x-ray are pending; congestive heart failure is considered although would not expect such a quick resolution.  The patient did not have any lightheadedness and a acute episode of hypotension with third spacing is less likely.  It is possible we may not know the cause of the patient's lower extremity swelling episode, but we will rule out acute renal insufficiency, bilateral DVTs, and CHF exacerbation.  Plan reevaluation for final disposition.  ____________________________________________  FINAL CLINICAL IMPRESSION(S) / ED DIAGNOSES  Final diagnoses:  Bilateral lower extremity edema  Hyperglycemia         NEW MEDICATIONS STARTED DURING THIS VISIT:  New Prescriptions    No medications on file      Eula Listen, MD 06/30/18 3500    Eula Listen, MD 06/30/18 2351

## 2018-06-30 NOTE — Telephone Encounter (Signed)
Attempted to call patient back, no answer.   

## 2018-06-30 NOTE — ED Notes (Signed)
Pt returning from US. 

## 2018-06-30 NOTE — Discharge Instructions (Signed)
They we did not find the cause for your acute lower extremity swelling.  If this happens again, you may elevate your legs and use compression stockings.  Please follow-up with your primary care physician for further evaluation.  Return to the emergency department if you develop severe pain, lightheadedness or fainting, fever, or any other symptoms concerning to you.

## 2018-06-30 NOTE — ED Triage Notes (Signed)
Pt reports swelling to both lower legs for 1 day.  No sob or chest pain.  cig smoker.  No bcp's.  Pt alert.

## 2018-06-30 NOTE — ED Notes (Signed)
Provider at pt's bedside.

## 2018-06-30 NOTE — Telephone Encounter (Signed)
Pt stated her feet just started swelling up and now look like balloons. She also stated that they are purple in color. Denies weakness, shortness of breath or chest pain. Advised to got to ED to be assessed. Pt voiced understanding and her husband will be taking her now. She took pictures of her feet and sent them to her provider. She is requesting she give her a call.  Her  # [phone]. Will notified [place]. Will call patient back in 5 mins to make sure she is on her way to the hospital.  Reason for Disposition . Entire foot is cool or blue in comparison to other side  Answer Assessment - Initial Assessment Questions 1. ONSET: "When did the swelling start?" (e.g., minutes, hours, days)     Now just 2. LOCATION: "What part of the leg is swollen?"  "Are both legs swollen or just one leg?"     feet 3. SEVERITY: "How bad is the swelling?" (e.g., localized; mild, moderate, severe)  - Localized - small area of swelling localized to one leg  - MILD pedal edema - swelling limited to foot and ankle, pitting edema < 1/4 inch (6 mm) deep, rest and elevation eliminate most or all swelling  - MODERATE edema - swelling of lower leg to knee, pitting edema > 1/4 inch (6 mm) deep, rest and elevation only partially reduce swelling  - SEVERE edema - swelling extends above knee, facial or hand swelling present      Swollen feet can not get shoes on 4. REDNESS: "Does the swelling look red or infected?"     purple 5. PAIN: "Is the swelling painful to touch?" If so, ask: "How painful is it?"   (Scale 1-10; mild, moderate or severe)     Painful when walking,pain # 6 6. FEVER: "Do you have a fever?" If so, ask: "What is it, how was it measured, and when did it start?"      no 7. CAUSE: "What do you think is causing the leg swelling?"     Not sure 8. MEDICAL HISTORY: "Do you have a history of heart failure, kidney disease, liver failure, or cancer?"     44 heart attacks 2 years ago 68. RECURRENT SYMPTOM:  "Have you had leg swelling before?" If so, ask: "When was the last time?" "What happened that time?"     No  10. OTHER SYMPTOMS: "Do you have any other symptoms?" (e.g., chest pain, difficulty breathing)       no 11. PREGNANCY: "Is there any chance you are pregnant?" "When was your last menstrual period?"       Having period now  Protocols used: LEG SWELLING AND EDEMA-A-AH

## 2018-07-01 ENCOUNTER — Ambulatory Visit: Payer: Self-pay

## 2018-07-01 ENCOUNTER — Encounter: Payer: Self-pay | Admitting: Family Medicine

## 2018-07-01 ENCOUNTER — Ambulatory Visit: Payer: BLUE CROSS/BLUE SHIELD | Admitting: Family Medicine

## 2018-07-01 ENCOUNTER — Emergency Department: Payer: BLUE CROSS/BLUE SHIELD

## 2018-07-01 VITALS — BP 146/102 | HR 82 | Temp 98.2°F | Ht 67.0 in | Wt 223.8 lb

## 2018-07-01 DIAGNOSIS — R609 Edema, unspecified: Secondary | ICD-10-CM

## 2018-07-01 DIAGNOSIS — M791 Myalgia, unspecified site: Secondary | ICD-10-CM | POA: Diagnosis not present

## 2018-07-01 DIAGNOSIS — M7989 Other specified soft tissue disorders: Secondary | ICD-10-CM | POA: Diagnosis not present

## 2018-07-01 NOTE — Telephone Encounter (Signed)
Pt. Seen in ED yesterday with bilateral leg swelling. Request follow up appointment today. Appointment made.  Reason for Disposition . [1] MODERATE leg swelling (e.g., swelling extends up to knees) AND [2] new onset or worsening  Answer Assessment - Initial Assessment Questions 1. ONSET: "When did the swelling start?" (e.g., minutes, hours, days)     Yesterday 2. LOCATION: "What part of the leg is swollen?"  "Are both legs swollen or just one leg?"     Both 3. SEVERITY: "How bad is the swelling?" (e.g., localized; mild, moderate, severe)  - Localized - small area of swelling localized to one leg  - MILD pedal edema - swelling limited to foot and ankle, pitting edema < 1/4 inch (6 mm) deep, rest and elevation eliminate most or all swelling  - MODERATE edema - swelling of lower leg to knee, pitting edema > 1/4 inch (6 mm) deep, rest and elevation only partially reduce swelling  - SEVERE edema - swelling extends above knee, facial or hand swelling present      Moderate 4. REDNESS: "Does the swelling look red or infected?"     No 5. PAIN: "Is the swelling painful to touch?" If so, ask: "How painful is it?"   (Scale 1-10; mild, moderate or severe)     Mild 6. FEVER: "Do you have a fever?" If so, ask: "What is it, how was it measured, and when did it start?"      No 7. CAUSE: "What do you think is causing the leg swelling?"     Unsure 8. MEDICAL HISTORY: "Do you have a history of heart failure, kidney disease, liver failure, or cancer?"     Heart history 9. RECURRENT SYMPTOM: "Have you had leg swelling before?" If so, ask: "When was the last time?" "What happened that time?"     No 10. OTHER SYMPTOMS: "Do you have any other symptoms?" (e.g., chest pain, difficulty breathing)       No 11. PREGNANCY: "Is there any chance you are pregnant?" "When was your last menstrual period?"       No  Protocols used: LEG SWELLING AND EDEMA-A-AH

## 2018-07-01 NOTE — ED Notes (Signed)
Pt discharged to home.  Family member driving.  Discharge instructions reviewed.  Verbalized understanding.  No questions or concerns at this time.  Teach back verified.  Pt in NAD.  No items left in ED.   

## 2018-07-01 NOTE — ED Notes (Signed)
Pt updated on delay.  Ultrasound to come get patient in approximately 30-45 minutes.  Pt aware and verbalized understanding at this time.

## 2018-07-01 NOTE — Assessment & Plan Note (Signed)
She has no edema today- it resolved quickly.  Labs and images reassuring that this was not CHF. She was on her feet all day this weekend for her daughter's birthday.  Could be possible that the neuromuscular process in her thighs was exacerbated by standing all day/dependent edema.  Advised continuing to elevated her feet when seated and using compression hose. Call or return to clinic prn if these symptoms worsen or fail to improve as anticipated. The patient indicates understanding of these issues and agrees with the plan.

## 2018-07-01 NOTE — Progress Notes (Signed)
Subjective:   Patient ID: Erika [name], female    DOB: 1971-07-28, 47 y.o.   MRN: [id number]  Erika [name] is a pleasant 47 y.o. year old female who presents to clinic today with Follow-up (Patient is here today to F/U after being seen at [facility] on 7.8.19 for BLE Edema and Hyperglycemia.  A Venous Doppler was completed and NO evidence of DVT.  CXR states No acute cardiopulmonary process.  EKG was compared to 1.2019 and is grossly unchanged in morphology.  BNP, BMP, CBC were unremarkable.  Advised at D/C that if happens again may elevate legs and use compression stockings.  There was no change in medications.  She states that her feet and thighs hurt and there is still some swelling.)  on 07/01/2018  HPI:  Edema- was seen at [facility] on 06/30/18 (yesterday) for bilateral LE edema and hyperglycemia. Notes reviewed.  Doppler neg for DVT.  CXR- neg.  EKG- unchanged from prior.  BNP, CMP, CBC unremarkable. Advised leg elevation and compression stockings.  Today swelling has improvement but better.  Feet and thighs still hurt.  Myalgias have been bothering her for some time.  She also has had some muscle atrophy in her thighs.  I referred to her to neurology for EMG/further work up.  Appointment is still pending.   Dg Chest 2 View  Result Date: 06/30/2018 CLINICAL DATA:  Lower extremity edema.  History of CABG. EXAM: CHEST - 2 VIEW COMPARISON:  Chest radiograph December 28, 2017 FINDINGS: Cardiomediastinal silhouette is normal, though remains somewhat rotated to the RIGHT. Status post median sternotomy for CABG. No pleural effusions or focal consolidations. Trachea projects midline and there is no pneumothorax. Soft tissue planes and included osseous structures are non-suspicious. IMPRESSION: 1. No acute cardiopulmonary process. Electronically Signed   By: Elon Alas M.D.   On: 06/30/2018 22:49   US Venous Img Lower Bilateral  Result Date: 07/01/2018 CLINICAL DATA:  Bilateral leg  swelling for 1 day. EXAM: BILATERAL LOWER EXTREMITY VENOUS DOPPLER ULTRASOUND TECHNIQUE: Gray-scale sonography with graded compression, as well as color Doppler and duplex ultrasound were performed to evaluate the lower extremity deep venous systems from the level of the common femoral vein and including the common femoral, femoral, profunda femoral, popliteal and calf veins including the posterior tibial, peroneal and gastrocnemius veins when visible. The superficial great saphenous vein was also interrogated. Spectral Doppler was utilized to evaluate flow at rest and with distal augmentation maneuvers in the common femoral, femoral and popliteal veins. COMPARISON:  None. FINDINGS: RIGHT LOWER EXTREMITY Common Femoral Vein: No evidence of thrombus. Normal compressibility, respiratory phasicity and response to augmentation. Saphenofemoral Junction: No evidence of thrombus. Normal compressibility and flow on color Doppler imaging. Profunda Femoral Vein: No evidence of thrombus. Normal compressibility and flow on color Doppler imaging. Femoral Vein: No evidence of thrombus. Normal compressibility, respiratory phasicity and response to augmentation. Popliteal Vein: No evidence of thrombus. Normal compressibility, respiratory phasicity and response to augmentation. Calf Veins: No evidence of thrombus. Normal compressibility and flow on color Doppler imaging. Superficial Great Saphenous Vein: No evidence of thrombus. Normal compressibility. Venous Reflux:  None. Other Findings:  None. LEFT LOWER EXTREMITY Common Femoral Vein: No evidence of thrombus. Normal compressibility, respiratory phasicity and response to augmentation. Saphenofemoral Junction: No evidence of thrombus. Normal compressibility and flow on color Doppler imaging. Profunda Femoral Vein: No evidence of thrombus. Normal compressibility and flow on color Doppler imaging. Femoral Vein: No evidence of thrombus. Normal compressibility, respiratory phasicity  and response to augmentation. Popliteal Vein: No evidence of thrombus. Normal compressibility, respiratory phasicity and response to augmentation. Calf Veins: No evidence of thrombus. Normal compressibility and flow on color Doppler imaging. Superficial Great Saphenous Vein: No evidence of thrombus. Normal compressibility. Venous Reflux:  None. Other Findings:  None. IMPRESSION: No evidence of bilateral lower extremity deep venous thrombosis. Electronically Signed   By: Jeb Levering M.D.   On: 07/01/2018 02:57     Current Outpatient Medications on File Prior to Visit  Medication Sig Dispense Refill  . albuterol (PROVENTIL HFA;VENTOLIN HFA) 108 (90 Base) MCG/ACT inhaler Inhale 2 puffs into the lungs every 6 (six) hours as needed for wheezing. 1 Inhaler 0  . aspirin EC 81 MG tablet Take 1 tablet (81 mg total) by mouth daily. 90 tablet 3  . atorvastatin (LIPITOR) 80 MG tablet TAKE ONE TABLET BY MOUTH ONCE DAILY AT 6 PM 90 tablet 2  . benztropine (COGENTIN) 0.5 MG tablet Take 1 tablet (0.5 mg total) by mouth at bedtime. 90 tablet 0  . carvedilol (COREG) 12.5 MG tablet Take 1 tablet (12.5 mg total) by mouth 2 (two) times daily with a meal. 60 tablet 3  . chlorproMAZINE (THORAZINE) 25 MG tablet Take 1 tablet (25 mg total) by mouth at bedtime. 90 tablet 0  . clonazePAM (KLONOPIN) 0.5 MG tablet Take 1 tablet (0.5 mg total) by mouth 4 (four) times daily as needed for anxiety. 120 tablet 2  . cyclobenzaprine (FEXMID) 7.5 MG tablet Take 1 tablet (7.5 mg total) by mouth 3 (three) times daily as needed for muscle spasms. 60 tablet 2  . Diclofenac Sodium (PENNSAID) 2 % SOLN Place 1 application onto the skin 2 (two) times daily. 1 Bottle 3  . FLUoxetine HCl 60 MG TABS Take 60 mg by mouth daily. 90 tablet 0  . HYDROcodone-acetaminophen (NORCO) 7.5-325 MG tablet Take 1 tablet by mouth every 6 (six) hours as needed for moderate pain. 60 tablet 0  . insulin glargine (LANTUS) 100 UNIT/ML injection Inject 0.3 mLs  (30 Units total) into the skin at bedtime. 10 mL 2  . Insulin Syringe-Needle U-100 29G X 1/2" 0.3 ML MISC 1 each by Does not apply route 2 (two) times daily. 30 each 0  . lamoTRIgine (LAMICTAL) 200 MG tablet Take 1 tablet (200 mg total) by mouth daily. 90 tablet 0  . nitroGLYCERIN (NITROSTAT) 0.4 MG SL tablet Place 1 tablet (0.4 mg total) under the tongue every 5 (five) minutes as needed for chest pain. 30 tablet 0  . NOVOLIN 70/30 RELION (70-30) 100 UNIT/ML injection INJECT 5 UNITS SUBCUTANEOUSLY TWICE DAILY 10 mL 2  . ondansetron (ZOFRAN) 4 MG tablet Take 1 tablet (4 mg total) by mouth daily as needed. 10 tablet 2  . rosuvastatin (CRESTOR) 10 MG tablet Take 1 tablet (10 mg total) by mouth daily. 90 tablet 3  . spironolactone (ALDACTONE) 25 MG tablet Take 1 tablet (25 mg total) by mouth daily. Please call to arrange an appointment for further refills. [phone] 30 tablet 1  . [DISCONTINUED] isosorbide mononitrate (IMDUR) 30 MG 24 hr tablet TAKE 1 TABLET BY MOUTH ONCE DAILY 30 tablet 3   No current facility-administered medications on file prior to visit.     Allergies  Allergen Reactions  . No Known Allergies     Past Medical History:  Diagnosis Date  . Arthralgia of temporomandibular joint   . CAD, multiple vessel    a. cath 06/29/16: ostLM 40%, ostLAD 40%, pLAD 95%,  ost-pLCx 60%, pLCx 95%, mLCx 60%, mRCA 95%, D2 50%, LVSF nl;  b. 07/2016 CABG x 4 (LIMA->LAD, VG->Diag, VG->OM, VG->RCA); c. 08/2016 Cath: 3VD w/ 4/4 patent grafts. LAD distal to LIMA has diff dzs->Med rx.  . Carotid arterial disease ([place])    a. 07/2016 s/p R CEA.  . Depression   . Diastolic dysfunction    a. echo 06/28/16: EF 50-55%, mild inf wall HK, GR1DD, mild MR, RV sys fxn nl, mildly dilated LA, PASP nl  . Fatty liver disease, nonalcoholic 7353  . HLD (hyperlipidemia)   . Labile hypertension    a. prior renal ngiogram negative for RAS in 03/2016; b. catecholamines and metanephrines normal, mildly elevated renin with  normal aldosterone and normal ratio in 02/2016  . Obesity   . PTSD (post-traumatic stress disorder)   . Tobacco abuse    a. 2018 - cut back from 2 ppd to 0.5 ppd.  . Type 2 diabetes mellitus [facility]) January 2017    Past Surgical History:  Procedure Laterality Date  . CARDIAC CATHETERIZATION [name] 06/29/2016   Procedure: Left Heart Cath and Coronary Angiography;  Surgeon: Minna Merritts, MD;  Location: [place] [facility];  Service: Cardiovascular;  Laterality: [name];  . CARDIAC CATHETERIZATION [name] 08/29/2016   Procedure: Left Heart Cath and Cors/Grafts Angiography;  Surgeon: Wellington Hampshire, MD;  Location: [place] [facility];  Service: Cardiovascular;  Laterality: [name];  . CESAREAN SECTION    . CHOLECYSTECTOMY    . CORONARY ARTERY BYPASS GRAFT [name] 07/06/2016   Procedure: CORONARY ARTERY BYPASS GRAFTING (CABG) x four, using left internal mammary artery and right leg greater saphenous vein harvested endoscopically;  Surgeon: Ivin Poot, MD;  Location: [place];  Service: Open Heart Surgery;  Laterality: [name];  . ENDARTERECTOMY Right 07/06/2016   Procedure: ENDARTERECTOMY CAROTID;  Surgeon: Rosetta Posner, MD;  Location: [place];  Service: Vascular;  Laterality: Right;  . PERIPHERAL VASCULAR CATHETERIZATION [name] 04/18/2016   Procedure: Renal Angiography;  Surgeon: Wellington Hampshire, MD;  Location: [place] [facility];  Service: Cardiovascular;  Laterality: [name];  . TEE WITHOUT CARDIOVERSION [name] 07/06/2016   Procedure: TRANSESOPHAGEAL ECHOCARDIOGRAM (TEE);  Surgeon: Ivin Poot, MD;  Location: [place];  Service: Open Heart Surgery;  Laterality: [name];  . TONSILLECTOMY      Family History  Adopted: Yes  Problem Relation Age of Onset  . Diabetes Mother   . Diabetes Father   . Alcohol abuse Father   . Heart disease Father   . Drug abuse Father   . Stroke Sister   . Anxiety disorder Sister     Social History   Socioeconomic History  . Marital status: Married    Spouse name: Not on file  . Number of  children: Not on file  . Years of education: Not on file  . Highest education level: Not on file  Occupational History  . Not on file  Social Needs  . Financial resource strain: Not on file  . Food insecurity:    Worry: Not on file    Inability: Not on file  . Transportation needs:    Medical: Not on file    Non-medical: Not on file  Tobacco Use  . Smoking status: Current Every Day Smoker    Packs/day: 0.25    Types: Cigarettes    Last attempt to quit: 05/23/2017    Years since quitting: 1.1  . Smokeless tobacco: Never Used  . Tobacco comment: patient smokes when she  drives.   Substance and Sexual Activity  . Alcohol use: No    Alcohol/week: 0.0 oz  . Drug use: No  . Sexual activity: Yes    Partners: Male    Birth control/protection: None  Lifestyle  . Physical activity:    Days per week: Not on file    Minutes per session: Not on file  . Stress: Not on file  Relationships  . Social connections:    Talks on phone: Not on file    Gets together: Not on file    Attends religious service: Not on file    Active member of club or organization: Not on file    Attends meetings of clubs or organizations: Not on file    Relationship status: Not on file  . Intimate partner violence:    Fear of current or ex partner: Not on file    Emotionally abused: Not on file    Physically abused: Not on file    Forced sexual activity: Not on file  Other Topics Concern  . Not on file  Social History Narrative  . Not on file   The PMH, PSH, Social History, Family History, Medications, and allergies have been reviewed in [facility], and have been updated if relevant.   Review of Systems  Constitutional: Negative.   Respiratory: Negative.   Cardiovascular: Negative.   Musculoskeletal: Positive for arthralgias and myalgias.  Neurological: Negative.   Hematological: Negative.   Psychiatric/Behavioral: Negative.   All other systems reviewed and are negative.      Objective:    BP (!)  146/102 (BP Location: Left Arm, Cuff Size: Normal)   Pulse 82   Temp 98.2 F (36.8 C) (Oral)   Ht 5\' 7"  (1.702 m)   Wt 223 lb 12.8 oz (101.5 kg)   LMP 06/30/2018 (Exact Date)   SpO2 95%   BMI 35.05 kg/m   BP Readings from Last 3 Encounters:  07/01/18 (!) 146/102  07/01/18 (!) 141/71  06/24/18 (!) 166/106    Physical Exam  Constitutional: She is oriented to person, place, and time. She appears well-developed and well-nourished. No distress.  HENT:  Head: Normocephalic and atraumatic.  Eyes: EOM are normal.  Cardiovascular: Normal rate and regular rhythm.  Pulmonary/Chest: Effort normal and breath sounds normal.  Musculoskeletal: She exhibits no edema.  Neurological: She is alert and oriented to person, place, and time.  Skin: Skin is warm and dry. She is not diaphoretic.  Psychiatric: She has a normal mood and affect. Her behavior is normal. Judgment and thought content normal.  Nursing note and vitals reviewed.         Assessment & Plan:   Myalgia  Edema, unspecified type No follow-ups on file.

## 2018-07-01 NOTE — ED Provider Notes (Signed)
-----------------------------------------   3:00 AM on 07/01/2018 -----------------------------------------   Blood pressure (!) 169/89, pulse 89, temperature 98.3 F (36.8 C), temperature source Oral, resp. rate 20, height 5\' 7"  (1.702 m), weight 98.9 kg (218 lb), last menstrual period 06/30/2018, SpO2 95 %.  Assuming care from Dr. Mariea Clonts.  In short, Erika [name] is a 47 y.o. female with a chief complaint of Leg Swelling .  Refer to the original H&P for additional details.  The current plan of care is to follow up the results of the ultrasound.  Ultrasound venous lower bilateral: No evidence of bilateral lower extremity DVT.  The patient will be discharged home to follow-up with her primary care physician.        Loney Hering, MD 07/01/18 0300

## 2018-07-01 NOTE — Telephone Encounter (Signed)
Patient seen in ED & has follow up with Dr. Deborra Medina 7/9.

## 2018-07-01 NOTE — Telephone Encounter (Signed)
Patient has appointment with Dr. Deborra Medina today.

## 2018-07-01 NOTE — Assessment & Plan Note (Signed)
Awaiting neurology appointment- referral was placed at last OV.

## 2018-07-05 DIAGNOSIS — G44009 Cluster headache syndrome, unspecified, not intractable: Secondary | ICD-10-CM | POA: Diagnosis not present

## 2018-07-21 ENCOUNTER — Telehealth: Payer: Self-pay

## 2018-07-21 MED ORDER — ISOSORBIDE MONONITRATE ER 30 MG PO TB24: 30.0000 mg | ORAL_TABLET | Freq: Every day | ORAL | 0 refills | Status: DC

## 2018-07-21 NOTE — Telephone Encounter (Signed)
S/w patient. She is still taking the isosorbide 30 mg daily. She was supposed to have an appointment with our office in May but it was cancelled due to change in provider schedule. She wants to go ahead and schedule f/u with Dr Fletcher Anon. Appointment scheduled at first available on 09/18/18 and refill sent in for enough to get her to that appointment. She was very [employer].

## 2018-07-21 NOTE — Telephone Encounter (Signed)
Please review patient's chart for refill on isosorbide mono 30 mg one tablet daily. It appears that the medications was discontinued on 07/01/2018.  The patient needs a follow up also. The patient was last seen on 07/17/2017.

## 2018-07-22 ENCOUNTER — Encounter (HOSPITAL_COMMUNITY): Payer: Self-pay | Admitting: Psychiatry

## 2018-07-22 ENCOUNTER — Other Ambulatory Visit: Payer: Self-pay | Admitting: Family Medicine

## 2018-07-22 ENCOUNTER — Ambulatory Visit (INDEPENDENT_AMBULATORY_CARE_PROVIDER_SITE_OTHER): Payer: BLUE CROSS/BLUE SHIELD | Admitting: Psychiatry

## 2018-07-22 DIAGNOSIS — F17210 Nicotine dependence, cigarettes, uncomplicated: Secondary | ICD-10-CM

## 2018-07-22 DIAGNOSIS — Z818 Family history of other mental and behavioral disorders: Secondary | ICD-10-CM

## 2018-07-22 DIAGNOSIS — F4310 Post-traumatic stress disorder, unspecified: Secondary | ICD-10-CM

## 2018-07-22 DIAGNOSIS — Z811 Family history of alcohol abuse and dependence: Secondary | ICD-10-CM | POA: Diagnosis not present

## 2018-07-22 DIAGNOSIS — F331 Major depressive disorder, recurrent, moderate: Secondary | ICD-10-CM | POA: Diagnosis not present

## 2018-07-22 DIAGNOSIS — Z813 Family history of other psychoactive substance abuse and dependence: Secondary | ICD-10-CM

## 2018-07-22 MED ORDER — CLONAZEPAM 0.5 MG PO TABS: 0.5000 mg | ORAL_TABLET | Freq: Three times a day (TID) | ORAL | 1 refills | Status: DC | PRN

## 2018-07-22 MED ORDER — FLUOXETINE HCL 60 MG PO TABS: 60.0000 mg | ORAL_TABLET | Freq: Every day | ORAL | 0 refills | Status: DC

## 2018-07-22 MED ORDER — CHLORPROMAZINE HCL 25 MG PO TABS: 25.0000 mg | ORAL_TABLET | Freq: Every day | ORAL | 0 refills | Status: DC

## 2018-07-22 MED ORDER — LAMOTRIGINE 200 MG PO TABS: 200.0000 mg | ORAL_TABLET | Freq: Every day | ORAL | 0 refills | Status: DC

## 2018-07-22 NOTE — Progress Notes (Signed)
BH MD/PA/NP OP Progress Note  07/22/2018 9:25 AM Erika [name]  MRN:  [id number]  Chief Complaint: returns for medication management. HPI:  I am covering for Dr. Adele Schilder who is out of office, patient aware that she will continue further follow ups with Dr. Adele Schilder. Reports she has been doing " about the same". Describes chronic anxiety,but states she is functioning well in her daily activities . Currently denies major neuro-vegetative symptoms of depression, denies suicidal or self injurious ideations. Reports chronic family stressors contribute to anxiety- daughter has history of chronic kidney disease, a cousin passed away recently.  She denies medication side effects - we have reviewed her current medication management. Denies medication side effects. She reports she is taking Klonopin 0.5 mgrs QID on most days for anxiety. Denies side effects. Denies sedation. Of note , she is also prescribed Hydrocodone by her PCP. We have reviewed potential risks associated with combination of opiate and BZDs. States she has been on these two medications concomitantly x several years without side effects or excessive sedation. Denies misuse .  Visit Diagnosis:    ICD-10-CM   1. PTSD (post-traumatic stress disorder) F43.10 chlorproMAZINE (THORAZINE) 25 MG tablet    clonazePAM (KLONOPIN) 0.5 MG tablet  2. Major depressive disorder, recurrent episode, moderate (HCC) F33.1 lamoTRIgine (LAMICTAL) 200 MG tablet    FLUoxetine HCl 60 MG TABS    Past Psychiatric History:   Past Medical History:  Past Medical History:  Diagnosis Date  . Arthralgia of temporomandibular joint   . CAD, multiple vessel    a. cath 06/29/16: ostLM 40%, ostLAD 40%, pLAD 95%, ost-pLCx 60%, pLCx 95%, mLCx 60%, mRCA 95%, D2 50%, LVSF nl;  b. 07/2016 CABG x 4 (LIMA->LAD, VG->Diag, VG->OM, VG->RCA); c. 08/2016 Cath: 3VD w/ 4/4 patent grafts. LAD distal to LIMA has diff dzs->Med rx.  . Carotid arterial disease ([place])    a. 07/2016 s/p R  CEA.  . Depression   . Diastolic dysfunction    a. echo 06/28/16: EF 50-55%, mild inf wall HK, GR1DD, mild MR, RV sys fxn nl, mildly dilated LA, PASP nl  . Fatty liver disease, nonalcoholic 5366  . HLD (hyperlipidemia)   . Labile hypertension    a. prior renal ngiogram negative for RAS in 03/2016; b. catecholamines and metanephrines normal, mildly elevated renin with normal aldosterone and normal ratio in 02/2016  . Obesity   . PTSD (post-traumatic stress disorder)   . Tobacco abuse    a. 2018 - cut back from 2 ppd to 0.5 ppd.  . Type 2 diabetes mellitus [facility]) January 2017    Past Surgical History:  Procedure Laterality Date  . CARDIAC CATHETERIZATION [name] 06/29/2016   Procedure: Left Heart Cath and Coronary Angiography;  Surgeon: Minna Merritts, MD;  Location: [place] [facility];  Service: Cardiovascular;  Laterality: [name];  . CARDIAC CATHETERIZATION [name] 08/29/2016   Procedure: Left Heart Cath and Cors/Grafts Angiography;  Surgeon: Wellington Hampshire, MD;  Location: [place] [facility];  Service: Cardiovascular;  Laterality: [name];  . CESAREAN SECTION    . CHOLECYSTECTOMY    . CORONARY ARTERY BYPASS GRAFT [name] 07/06/2016   Procedure: CORONARY ARTERY BYPASS GRAFTING (CABG) x four, using left internal mammary artery and right leg greater saphenous vein harvested endoscopically;  Surgeon: Ivin Poot, MD;  Location: [place];  Service: Open Heart Surgery;  Laterality: [name];  . ENDARTERECTOMY Right 07/06/2016   Procedure: ENDARTERECTOMY CAROTID;  Surgeon: Rosetta Posner, MD;  Location: [place];  Service: Vascular;  Laterality: Right;  . PERIPHERAL VASCULAR CATHETERIZATION [name] 04/18/2016   Procedure: Renal Angiography;  Surgeon: Wellington Hampshire, MD;  Location: [place] [facility];  Service: Cardiovascular;  Laterality: [name];  . TEE WITHOUT CARDIOVERSION [name] 07/06/2016   Procedure: TRANSESOPHAGEAL ECHOCARDIOGRAM (TEE);  Surgeon: Ivin Poot, MD;  Location: [place];  Service: Open Heart Surgery;  Laterality:  [name];  . TONSILLECTOMY      Family Psychiatric History:   Family History:  Family History  Adopted: Yes  Problem Relation Age of Onset  . Diabetes Mother   . Diabetes Father   . Alcohol abuse Father   . Heart disease Father   . Drug abuse Father   . Stroke Sister   . Anxiety disorder Sister     Social History:  Social History   Socioeconomic History  . Marital status: Married    Spouse name: Not on file  . Number of children: Not on file  . Years of education: Not on file  . Highest education level: Not on file  Occupational History  . Not on file  Social Needs  . Financial resource strain: Not on file  . Food insecurity:    Worry: Not on file    Inability: Not on file  . Transportation needs:    Medical: Not on file    Non-medical: Not on file  Tobacco Use  . Smoking status: Current Every Day Smoker    Packs/day: 0.25    Types: Cigarettes    Last attempt to quit: 05/23/2017    Years since quitting: 1.1  . Smokeless tobacco: Never Used  . Tobacco comment: patient smokes when she drives.   Substance and Sexual Activity  . Alcohol use: No    Alcohol/week: 0.0 oz  . Drug use: No  . Sexual activity: Yes    Partners: Male    Birth control/protection: None  Lifestyle  . Physical activity:    Days per week: Not on file    Minutes per session: Not on file  . Stress: Not on file  Relationships  . Social connections:    Talks on phone: Not on file    Gets together: Not on file    Attends religious service: Not on file    Active member of club or organization: Not on file    Attends meetings of clubs or organizations: Not on file    Relationship status: Not on file  Other Topics Concern  . Not on file  Social History Narrative  . Not on file    Allergies:  Allergies  Allergen Reactions  . No Known Allergies     Metabolic Disorder Labs: Lab Results  Component Value Date   HGBA1C 10.4 (H) 06/24/2018   MPG 269 05/19/2017   MPG 131 08/30/2016   No  results found for: PROLACTIN Lab Results  Component Value Date   CHOL 240 (H) 02/25/2018   TRIG (H) 02/25/2018    468.0 Triglyceride is over 400; calculations on Lipids are invalid.   HDL 30.50 (L) 02/25/2018   CHOLHDL 8 02/25/2018   VLDL UNABLE TO CALCULATE IF TRIGLYCERIDE OVER 400 mg/dL 06/28/2016   LDLCALC 64 08/21/2016   LDLCALC UNABLE TO CALCULATE IF TRIGLYCERIDE OVER 400 mg/dL 06/28/2016   Lab Results  Component Value Date   TSH 1.68 02/25/2018   TSH 1.485 06/30/2016    Therapeutic Level Labs: No results found for: LITHIUM No results found for: VALPROATE No components found for:  CBMZ  Current Medications: Current Outpatient Medications  Medication Sig Dispense Refill  . albuterol (PROVENTIL HFA;VENTOLIN HFA) 108 (90 Base) MCG/ACT inhaler Inhale 2 puffs into the lungs every 6 (six) hours as needed for wheezing. 1 Inhaler 0  . aspirin EC 81 MG tablet Take 1 tablet (81 mg total) by mouth daily. 90 tablet 3  . atorvastatin (LIPITOR) 80 MG tablet TAKE ONE TABLET BY MOUTH ONCE DAILY AT 6 PM 90 tablet 2  . carvedilol (COREG) 12.5 MG tablet Take 1 tablet (12.5 mg total) by mouth 2 (two) times daily with a meal. 60 tablet 3  . chlorproMAZINE (THORAZINE) 25 MG tablet Take 1 tablet (25 mg total) by mouth at bedtime. 90 tablet 0  . clonazePAM (KLONOPIN) 0.5 MG tablet Take 1 tablet (0.5 mg total) by mouth 3 (three) times daily as needed for anxiety. 90 tablet 1  . cyclobenzaprine (FEXMID) 7.5 MG tablet Take 1 tablet (7.5 mg total) by mouth 3 (three) times daily as needed for muscle spasms. 60 tablet 2  . Diclofenac Sodium (PENNSAID) 2 % SOLN Place 1 application onto the skin 2 (two) times daily. 1 Bottle 3  . FLUoxetine HCl 60 MG TABS Take 60 mg by mouth daily. 90 tablet 0  . HYDROcodone-acetaminophen (NORCO) 7.5-325 MG tablet Take 1 tablet by mouth every 6 (six) hours as needed for moderate pain. 60 tablet 0  . insulin glargine (LANTUS) 100 UNIT/ML injection Inject 0.3 mLs (30 Units  total) into the skin at bedtime. 10 mL 2  . Insulin Syringe-Needle U-100 29G X 1/2" 0.3 ML MISC 1 each by Does not apply route 2 (two) times daily. 30 each 0  . isosorbide mononitrate (IMDUR) 30 MG 24 hr tablet Take 1 tablet (30 mg total) by mouth daily. Please keep appointment for further refills! Thanks :) 90 tablet 0  . lamoTRIgine (LAMICTAL) 200 MG tablet Take 1 tablet (200 mg total) by mouth daily. 90 tablet 0  . nitroGLYCERIN (NITROSTAT) 0.4 MG SL tablet PLACE 1 TABLET (0.4 MG TOTAL) UNDER THE TONGUE EVERY 5 (FIVE) MINUTES AS NEEDED FOR CHEST PAIN. 25 tablet 0  . NOVOLIN 70/30 RELION (70-30) 100 UNIT/ML injection INJECT 5 UNITS SUBCUTANEOUSLY TWICE DAILY 10 mL 2  . ondansetron (ZOFRAN) 4 MG tablet Take 1 tablet (4 mg total) by mouth daily as needed. 10 tablet 2  . rosuvastatin (CRESTOR) 10 MG tablet Take 1 tablet (10 mg total) by mouth daily. 90 tablet 3  . spironolactone (ALDACTONE) 25 MG tablet Take 1 tablet (25 mg total) by mouth daily. Please call to arrange an appointment for further refills. [phone] 30 tablet 1   No current facility-administered medications for this visit.      Musculoskeletal: Strength & Muscle Tone: within normal limits Gait & Station: normal Patient leans: [name]  Psychiatric Specialty Exam: ROS denies sedation, no shortness of breath, no  vomiting   Blood pressure (!) 174/80, pulse 88, height 5\' 7"  (1.702 m), weight 99.8 kg (220 lb), last menstrual period 06/30/2018, SpO2 95 %.Body mass index is 34.46 kg/m.  General Appearance: Well Groomed  Eye Contact:  Good  Speech:  Normal Rate  Volume:  Normal  Mood:  reports mood as "OK", presents euthymic at this time  Affect:  appropriate, reactive   Thought Process:  Linear and Descriptions of Associations: Intact  Orientation:  Full (Time, Place, and Person)  Thought Content: no hallucinations, no delusions    Suicidal Thoughts:  No denies suicidal ideations  Homicidal Thoughts:  No  Memory:  recent and  remote grossly intact   Judgement:  Other:  present   Insight:  Present  Psychomotor Activity:  Normal  Concentration:  Concentration: Good and Attention Span: Good  Recall:  Good  Fund of Knowledge: Good  Language: Good  Akathisia:  Negative  Handed:  Right  AIMS (if indicated): no abnormal movements noted or reported   Assets:  Resilience  ADL's:  Intact  Cognition: WNL  Sleep:  Good   Screenings: GAD-7     Office Visit from 06/24/2018 in [facility] [place] Visit from 03/25/2018 in [place] from 03/02/2016 in [place] [facility]  Total GAD-7 Score  21  21  21     PHQ2-9     Office Visit from 06/24/2018 in [facility] [employer] Visit from 03/25/2018 in [place] Visit from 11/18/2017 in [place] from 09/13/2016 in [place] from 03/02/2016 in [place] [facility]  PHQ-2 Total Score  6  6  6  4  6   PHQ-9 Total Score  22  24  22  23  21        Assessment and Plan: Patient is 47 year old female with history of depression, anxiety disorder- has been diagnosed with PTSD in the past . Currently stable on medication regimen, which she is tolerating well. She is on long term management with BZD and Opiates concomitantly without sedation or side effects at this time- we have discussed potential risks . Medication side effects reviewed. Agrees to taper Klonopin to 0.5 mgrs TID PRN for anxiety, and will also discontinue Cogentin , as no EPS and on low dose Thorazine .  No other medication changes at this time- will see Dr. Adele Schilder in 2 months, patient agrees to contact clinic sooner if any concerns or issues prior .     Jenne Campus, MD 07/22/2018, 9:25 AM

## 2018-07-30 ENCOUNTER — Telehealth: Payer: Self-pay | Admitting: Family Medicine

## 2018-07-30 NOTE — Telephone Encounter (Signed)
Copied from [place] [id number]. Topic: Quick Communication - Rx Refill/Question >> Jul 30, 2018  9:31 AM Oliver Pila B wrote: Medication: cyclobenzaprine (FEXMID) 7.5 MG tablet [[id number]]   Pharmacy called to speak w/ a nurse about the medication above; wanted to make sure that the pt can have this medication combined w/ others that she takes, contact pharmacy

## 2018-07-31 NOTE — Telephone Encounter (Signed)
Called [facility] and LMOVM stating that yes pt is to take this medication and I have already told them this in the past to please dispence/thx dmf

## 2018-08-05 DIAGNOSIS — G44009 Cluster headache syndrome, unspecified, not intractable: Secondary | ICD-10-CM | POA: Diagnosis not present

## 2018-08-16 ENCOUNTER — Other Ambulatory Visit (HOSPITAL_COMMUNITY): Payer: Self-pay | Admitting: Psychiatry

## 2018-08-16 DIAGNOSIS — F4310 Post-traumatic stress disorder, unspecified: Secondary | ICD-10-CM

## 2018-08-22 ENCOUNTER — Other Ambulatory Visit: Payer: Self-pay | Admitting: Cardiovascular Disease

## 2018-09-01 ENCOUNTER — Ambulatory Visit: Payer: Self-pay | Admitting: Neurology

## 2018-09-05 DIAGNOSIS — G44009 Cluster headache syndrome, unspecified, not intractable: Secondary | ICD-10-CM | POA: Diagnosis not present

## 2018-09-07 ENCOUNTER — Other Ambulatory Visit: Payer: Self-pay | Admitting: Family Medicine

## 2018-09-08 ENCOUNTER — Ambulatory Visit: Payer: BLUE CROSS/BLUE SHIELD | Admitting: Family Medicine

## 2018-09-08 ENCOUNTER — Encounter: Payer: Self-pay | Admitting: Family Medicine

## 2018-09-08 ENCOUNTER — Ambulatory Visit: Payer: Self-pay | Admitting: Family Medicine

## 2018-09-08 DIAGNOSIS — I10 Essential (primary) hypertension: Secondary | ICD-10-CM

## 2018-09-08 DIAGNOSIS — S90562A Insect bite (nonvenomous), left ankle, initial encounter: Secondary | ICD-10-CM

## 2018-09-08 DIAGNOSIS — W57XXXA Bitten or stung by nonvenomous insect and other nonvenomous arthropods, initial encounter: Secondary | ICD-10-CM

## 2018-09-08 MED ORDER — DOXYCYCLINE HYCLATE 100 MG PO TABS: 100.0000 mg | ORAL_TABLET | Freq: Two times a day (BID) | ORAL | 0 refills | Status: DC

## 2018-09-08 MED ORDER — TRIAMCINOLONE ACETONIDE 0.1 % EX CREA
1.0000 | TOPICAL_CREAM | Freq: Two times a day (BID) | CUTANEOUS | 0 refills | Status: DC
Start: 2018-09-08 — End: 2019-02-10

## 2018-09-08 NOTE — Progress Notes (Signed)
Erika [name] - 47 y.o. female MRN [id number]  Date of birth: Feb 05, 1971  Subjective Chief Complaint  Patient presents with  . Insect Bite    HPI Erika [name] is a 47 y.o. female with history of CAD, T2DM, Malignant HTN, Depression with anxiety and nicotine dependence here today for evaluation of insect bites.  Reports that she noticed insect bites about 2 weeks ago around her ankles.  She is concerned because they are more reddened with mild pain and not healing.  She also reports a lot of itching as well.  She denies fever, chills increased numbness or tingling.    ROS:  A comprehensive ROS was completed and negative except as noted per HPI  Allergies  Allergen Reactions  . No Known Allergies     Past Medical History:  Diagnosis Date  . Arthralgia of temporomandibular joint   . CAD, multiple vessel    a. cath 06/29/16: ostLM 40%, ostLAD 40%, pLAD 95%, ost-pLCx 60%, pLCx 95%, mLCx 60%, mRCA 95%, D2 50%, LVSF nl;  b. 07/2016 CABG x 4 (LIMA->LAD, VG->Diag, VG->OM, VG->RCA); c. 08/2016 Cath: 3VD w/ 4/4 patent grafts. LAD distal to LIMA has diff dzs->Med rx.  . Carotid arterial disease ([place])    a. 07/2016 s/p R CEA.  . Depression   . Diastolic dysfunction    a. echo 06/28/16: EF 50-55%, mild inf wall HK, GR1DD, mild MR, RV sys fxn nl, mildly dilated LA, PASP nl  . Fatty liver disease, nonalcoholic 7654  . HLD (hyperlipidemia)   . Labile hypertension    a. prior renal ngiogram negative for RAS in 03/2016; b. catecholamines and metanephrines normal, mildly elevated renin with normal aldosterone and normal ratio in 02/2016  . Obesity   . PTSD (post-traumatic stress disorder)   . Tobacco abuse    a. 2018 - cut back from 2 ppd to 0.5 ppd.  . Type 2 diabetes mellitus [facility]) January 2017    Past Surgical History:  Procedure Laterality Date  . CARDIAC CATHETERIZATION [name] 06/29/2016   Procedure: Left Heart Cath and Coronary Angiography;  Surgeon: Minna Merritts, MD;  Location: [place] [facility];  Service: Cardiovascular;  Laterality: [name];  . CARDIAC CATHETERIZATION [name] 08/29/2016   Procedure: Left Heart Cath and Cors/Grafts Angiography;  Surgeon: Wellington Hampshire, MD;  Location: [place] [facility];  Service: Cardiovascular;  Laterality: [name];  . CESAREAN SECTION    . CHOLECYSTECTOMY    . CORONARY ARTERY BYPASS GRAFT [name] 07/06/2016   Procedure: CORONARY ARTERY BYPASS GRAFTING (CABG) x four, using left internal mammary artery and right leg greater saphenous vein harvested endoscopically;  Surgeon: Ivin Poot, MD;  Location: [place];  Service: Open Heart Surgery;  Laterality: [name];  . ENDARTERECTOMY Right 07/06/2016   Procedure: ENDARTERECTOMY CAROTID;  Surgeon: Rosetta Posner, MD;  Location: [place];  Service: Vascular;  Laterality: Right;  . PERIPHERAL VASCULAR CATHETERIZATION [name] 04/18/2016   Procedure: Renal Angiography;  Surgeon: Wellington Hampshire, MD;  Location: [place] [facility];  Service: Cardiovascular;  Laterality: [name];  . TEE WITHOUT CARDIOVERSION [name] 07/06/2016   Procedure: TRANSESOPHAGEAL ECHOCARDIOGRAM (TEE);  Surgeon: Ivin Poot, MD;  Location: [place];  Service: Open Heart Surgery;  Laterality: [name];  . TONSILLECTOMY      Social History   Socioeconomic History  . Marital status: Married    Spouse name: Not on file  . Number of children: Not on file  . Years of education: Not on file  .  Highest education level: Not on file  Occupational History  . Not on file  Social Needs  . Financial resource strain: Not on file  . Food insecurity:    Worry: Not on file    Inability: Not on file  . Transportation needs:    Medical: Not on file    Non-medical: Not on file  Tobacco Use  . Smoking status: Current Every Day Smoker    Packs/day: 0.25    Types: Cigarettes    Last attempt to quit: 05/23/2017    Years since quitting: 1.2  . Smokeless tobacco: Never Used  . Tobacco comment: patient smokes when she drives.   Substance and Sexual Activity  . Alcohol  use: No    Alcohol/week: 0.0 standard drinks  . Drug use: No  . Sexual activity: Yes    Partners: Male    Birth control/protection: None  Lifestyle  . Physical activity:    Days per week: Not on file    Minutes per session: Not on file  . Stress: Not on file  Relationships  . Social connections:    Talks on phone: Not on file    Gets together: Not on file    Attends religious service: Not on file    Active member of club or organization: Not on file    Attends meetings of clubs or organizations: Not on file    Relationship status: Not on file  Other Topics Concern  . Not on file  Social History Narrative  . Not on file    Family History  Adopted: Yes  Problem Relation Age of Onset  . Diabetes Mother   . Diabetes Father   . Alcohol abuse Father   . Heart disease Father   . Drug abuse Father   . Stroke Sister   . Anxiety disorder Sister     Health Maintenance  Topic Date Due  . INFLUENZA VACCINE  07/24/2018  . FOOT EXAM  08/15/2018  . URINE MICROALBUMIN  08/15/2018  . OPHTHALMOLOGY EXAM  10/26/2018  . HEMOGLOBIN A1C  12/25/2018  . TETANUS/TDAP  07/26/2020  . PAP SMEAR  02/25/2021  . PNEUMOCOCCAL POLYSACCHARIDE VACCINE AGE 68-64 HIGH RISK  Completed  . HIV Screening  Completed    ----------------------------------------------------------------------------------------------------------------------------------------------------------------------------------------------------------------- Physical Exam BP (!) 218/132 (BP Location: Left Arm, Patient Position: Sitting, Cuff Size: Large)   Pulse (!) 121   Temp 98.1 F (36.7 C) (Oral)   Ht 5\' 7"  (1.702 m)   Wt 217 lb 6.4 oz (98.6 kg)   SpO2 97%   BMI 34.05 kg/m   Physical Exam  Constitutional: She appears well-nourished. No distress.  Eyes: No scleral icterus.  Cardiovascular: Normal rate, regular rhythm, normal heart sounds and intact distal pulses.  Neurological: She is alert.  Skin: Skin is warm and dry.  3  slightly raised erythematous papules on L ankle.  No induration or fluctuation.  One papule covered with scab.   Psychiatric: She has a normal mood and affect. Her behavior is normal.    ------------------------------------------------------------------------------------------------------------------------------------------------------------------------------------------------------------------- Assessment and Plan  Insect bite Previous insect bites with surrounding erythema and prolonged healing, likely has underlying infection.  Rx for doxycycline.  May use triamcinolone for surrounding itching.   Essential hypertension, malignant -BP is very high today, it appears typical for her.  -She has tried several different medications without significant improvement.  -Denies symptoms -She will continue to follow with cardiology for mgmt.

## 2018-09-08 NOTE — Patient Instructions (Signed)
Start doxycycline Use triamcinolone for itching Let me know if this is not helping.

## 2018-09-08 NOTE — Assessment & Plan Note (Signed)
-  BP is very high today, it appears typical for her.  -She has tried several different medications without significant improvement.  -Denies symptoms -She will continue to follow with cardiology for mgmt.

## 2018-09-08 NOTE — Assessment & Plan Note (Signed)
Previous insect bites with surrounding erythema and prolonged healing, likely has underlying infection.  Rx for doxycycline.  May use triamcinolone for surrounding itching.

## 2018-09-17 ENCOUNTER — Other Ambulatory Visit: Payer: Self-pay | Admitting: Cardiovascular Disease

## 2018-09-18 ENCOUNTER — Ambulatory Visit (INDEPENDENT_AMBULATORY_CARE_PROVIDER_SITE_OTHER): Payer: BLUE CROSS/BLUE SHIELD | Admitting: Cardiovascular Disease

## 2018-09-18 ENCOUNTER — Encounter: Payer: Self-pay | Admitting: Cardiovascular Disease

## 2018-09-18 VITALS — BP 180/110 | HR 89 | Ht 67.0 in | Wt 214.8 lb

## 2018-09-18 DIAGNOSIS — I6529 Occlusion and stenosis of unspecified carotid artery: Secondary | ICD-10-CM

## 2018-09-18 DIAGNOSIS — I10 Essential (primary) hypertension: Secondary | ICD-10-CM

## 2018-09-18 DIAGNOSIS — Z720 Tobacco use: Secondary | ICD-10-CM | POA: Diagnosis not present

## 2018-09-18 DIAGNOSIS — I739 Peripheral vascular disease, unspecified: Secondary | ICD-10-CM | POA: Diagnosis not present

## 2018-09-18 DIAGNOSIS — I25118 Atherosclerotic heart disease of native coronary artery with other forms of angina pectoris: Secondary | ICD-10-CM

## 2018-09-18 DIAGNOSIS — E785 Hyperlipidemia, unspecified: Secondary | ICD-10-CM

## 2018-09-18 MED ORDER — SPIRONOLACTONE 25 MG PO TABS: 25.0000 mg | ORAL_TABLET | Freq: Every day | ORAL | 5 refills | Status: DC

## 2018-09-18 MED ORDER — CARVEDILOL 25 MG PO TABS: 25.0000 mg | ORAL_TABLET | Freq: Two times a day (BID) | ORAL | 0 refills | Status: DC

## 2018-09-18 NOTE — Patient Instructions (Signed)
Medication Instructions: INCREASE the Carvedilol to 25 mg twice daily  If you need a refill on your cardiac medications before your next appointment, please call your pharmacy.   Procedures/Testing: Your physician has requested that you have a lower extremity segmental doppler. This will take place at [id number], the [facility] office.   Follow-Up: Your physician wants you to follow-up in 2 months with Dr. Fletcher Anon.    Thank you for choosing [facility]!

## 2018-09-18 NOTE — Progress Notes (Signed)
Cardiology Office Note   Date:  09/18/2018   ID:  Erika [name], DOB Dec 04, 1971, MRN [id number]  PCP:  Lucille Passy, MD Cardiologist:   [name], MD   Chief Complaint  Patient presents with  . other    6 month follow up; last seen 07/17/2017. Meds reviewed by the pt. verbally. Pt. c/o elevated blood pressure.       History of Present Illness: Erika [name] is a 47 y.o. female who presents for a follow-up visit regarding coronary artery disease .[name] with no evidence of secondary hypertension. She presented in July, 2017 with non-ST elevation myocardial infarction. She underwent cardiac catheterization which showed severe three-vessel coronary artery disease.  Preop carotid Doppler showed 80-99% right ICA stenosis. She underwent CABG and right carotid endarterectomy at the same time in July of 2017.  She was rehospitalized in September, 2017 with chest pain in the setting of uncontrolled hypertension. Cardiac catheterization showed patent grafts . The LAD had diffuse disease distal to the anastomosis and was very small in caliber. Ejection fraction was normal with mildly elevated left ventricular end-diastolic pressure.   She has history of difficult to control hypertension with labile blood pressure.  Most recently, her blood pressure has been elevated most of the time.  She denies any chest pain or shortness of breath.  Unfortunately, she continues to smoke.  She has known history of severe mixed hyperlipidemia and was switched recently to rosuvastatin.  She has not had any labs on this yet.  She complains of bilateral leg pain with walking which is equal in both sides.  This happens after walking short distance and usually she has to stop and rest before she can resume.  She has no history of peripheral arterial disease.  Past Medical History:  Diagnosis Date  . Arthralgia of temporomandibular joint   . CAD, multiple  vessel    a. cath 06/29/16: ostLM 40%, ostLAD 40%, pLAD 95%, ost-pLCx 60%, pLCx 95%, mLCx 60%, mRCA 95%, D2 50%, LVSF nl;  b. 07/2016 CABG x 4 (LIMA->LAD, VG->Diag, VG->OM, VG->RCA); c. 08/2016 Cath: 3VD w/ 4/4 patent grafts. LAD distal to LIMA has diff dzs->Med rx.  . Carotid arterial disease ([place])    a. 07/2016 s/p R CEA.  . Depression   . Diastolic dysfunction    a. echo 06/28/16: EF 50-55%, mild inf wall HK, GR1DD, mild MR, RV sys fxn nl, mildly dilated LA, PASP nl  . Fatty liver disease, nonalcoholic 6967  . HLD (hyperlipidemia)   . Labile hypertension    a. prior renal ngiogram negative for RAS in 03/2016; b. catecholamines and metanephrines normal, mildly elevated renin with normal aldosterone and normal ratio in 02/2016  . Obesity   . PTSD (post-traumatic stress disorder)   . Tobacco abuse    a. 2018 - cut back from 2 ppd to 0.5 ppd.  . Type 2 diabetes mellitus [facility]) January 2017    Past Surgical History:  Procedure Laterality Date  . CARDIAC CATHETERIZATION [name] 06/29/2016   Procedure: Left Heart Cath and Coronary Angiography;  Surgeon: Minna Merritts, MD;  Location: [place] [facility];  Service: Cardiovascular;  Laterality: [name];  . CARDIAC CATHETERIZATION [name] 08/29/2016   Procedure: Left Heart Cath and Cors/Grafts Angiography;  Surgeon: Wellington Hampshire, MD;  Location: [place] [facility];  Service: Cardiovascular;  Laterality: [name];  . CESAREAN SECTION    . CHOLECYSTECTOMY    . CORONARY ARTERY  BYPASS GRAFT [name] 07/06/2016   Procedure: CORONARY ARTERY BYPASS GRAFTING (CABG) x four, using left internal mammary artery and right leg greater saphenous vein harvested endoscopically;  Surgeon: Ivin Poot, MD;  Location: [place];  Service: Open Heart Surgery;  Laterality: [name];  . ENDARTERECTOMY Right 07/06/2016   Procedure: ENDARTERECTOMY CAROTID;  Surgeon: Rosetta Posner, MD;  Location: [place];  Service: Vascular;  Laterality: Right;  . PERIPHERAL VASCULAR CATHETERIZATION [name] 04/18/2016    Procedure: Renal Angiography;  Surgeon: Wellington Hampshire, MD;  Location: [place] [facility];  Service: Cardiovascular;  Laterality: [name];  . TEE WITHOUT CARDIOVERSION [name] 07/06/2016   Procedure: TRANSESOPHAGEAL ECHOCARDIOGRAM (TEE);  Surgeon: Ivin Poot, MD;  Location: [place];  Service: Open Heart Surgery;  Laterality: [name];  . TONSILLECTOMY       Current Outpatient Medications  Medication Sig Dispense Refill  . albuterol (PROVENTIL HFA;VENTOLIN HFA) 108 (90 Base) MCG/ACT inhaler Inhale 2 puffs into the lungs every 6 (six) hours as needed for wheezing. 1 Inhaler 0  . aspirin EC 81 MG tablet Take 1 tablet (81 mg total) by mouth daily. 90 tablet 3  . atorvastatin (LIPITOR) 80 MG tablet TAKE ONE TABLET BY MOUTH ONCE DAILY AT 6 PM 90 tablet 2  . carvedilol (COREG) 12.5 MG tablet Take 1 tablet (12.5 mg total) by mouth 2 (two) times daily with a meal. 60 tablet 3  . chlorproMAZINE (THORAZINE) 25 MG tablet Take 1 tablet (25 mg total) by mouth at bedtime. 90 tablet 0  . clonazePAM (KLONOPIN) 0.5 MG tablet Take 1 tablet (0.5 mg total) by mouth 3 (three) times daily as needed for anxiety. 90 tablet 1  . cyclobenzaprine (FEXMID) 7.5 MG tablet TAKE 1 TABLET BY MOUTH 3 TIMES A DAY AS NEEDED FOR MUSCLE SPASMS 60 tablet 0  . Diclofenac Sodium (PENNSAID) 2 % SOLN Place 1 application onto the skin 2 (two) times daily. 1 Bottle 3  . FLUoxetine HCl 60 MG TABS Take 60 mg by mouth daily. 90 tablet 0  . HYDROcodone-acetaminophen (NORCO) 7.5-325 MG tablet Take 1 tablet by mouth every 6 (six) hours as needed for moderate pain. 60 tablet 0  . insulin glargine (LANTUS) 100 UNIT/ML injection Inject 0.3 mLs (30 Units total) into the skin at bedtime. 10 mL 2  . Insulin Syringe-Needle U-100 29G X 1/2" 0.3 ML MISC 1 each by Does not apply route 2 (two) times daily. 30 each 0  . isosorbide mononitrate (IMDUR) 30 MG 24 hr tablet Take 1 tablet (30 mg total) by mouth daily. Please keep appointment for further refills! Thanks :)  90 tablet 0  . lamoTRIgine (LAMICTAL) 200 MG tablet Take 1 tablet (200 mg total) by mouth daily. 90 tablet 0  . nitroGLYCERIN (NITROSTAT) 0.4 MG SL tablet PLACE 1 TABLET (0.4 MG TOTAL) UNDER THE TONGUE EVERY 5 (FIVE) MINUTES AS NEEDED FOR CHEST PAIN. 25 tablet 0  . NOVOLIN 70/30 RELION (70-30) 100 UNIT/ML injection INJECT 5 UNITS SUBCUTANEOUSLY TWICE DAILY 10 mL 2  . ondansetron (ZOFRAN) 4 MG tablet Take 1 tablet (4 mg total) by mouth daily as needed. 10 tablet 2  . rosuvastatin (CRESTOR) 10 MG tablet Take 1 tablet (10 mg total) by mouth daily. 90 tablet 3  . spironolactone (ALDACTONE) 25 MG tablet Take 1 tablet (25 mg total) by mouth daily. 30 tablet 0  . triamcinolone cream (KENALOG) 0.1 % Apply 1 application topically 2 (two) times daily. 30 g 0   No current facility-administered medications  for this visit.     Allergies:   No known allergies    Social History:  The patient  reports that she has been smoking cigarettes. She has been smoking about 1.00 pack per day. She has never used smokeless tobacco. She reports that she does not drink alcohol or use drugs.   Family History:  The patient's family history includes Alcohol abuse in her father; Anxiety disorder in her sister; Diabetes in her father and mother; Drug abuse in her father; Heart disease in her father; Stroke in her sister. She was adopted.    ROS:  Please see the history of present illness.   Otherwise, review of systems are positive for none.   All other systems are reviewed and negative.    PHYSICAL EXAM: VS:  BP (!) 180/110 (BP Location: Left Arm, Patient Position: Sitting, Cuff Size: Normal)   Pulse 89   Ht 5\' 7"  (1.702 m)   Wt 214 lb 12 oz (97.4 kg)   BMI 33.63 kg/m  , BMI Body mass index is 33.63 kg/m. GEN: Well nourished, well developed, in no acute distress  HEENT: normal  Neck: no JVD, carotid bruits, or masses Cardiac: RRR; no  rubs, or gallops,no edema . There is 1/6 systolic flow murmur in the pulmonic  area Respiratory:  clear to auscultation bilaterally, normal work of breathing GI: soft, nontender, nondistended, + BS MS: no deformity or atrophy  Skin: warm and dry, no rash Neuro:  Strength and sensation are intact Psych: euthymic mood, full affect Vascular: Femoral pulses are slightly diminished bilaterally.  Dorsalis pedis is palpable on both sides but mildly diminished.   EKG:  EKG is  ordered today. EKG showed normal sinus rhythm with old septal infarct.  Inferolateral ST changes suggestive of ischemia.   Recent Labs: 02/25/2018: TSH 1.68 06/24/2018: ALT 18 06/30/2018: B Natriuretic Peptide 21.0; BUN 10; Creatinine, Ser 0.57; Hemoglobin 14.3; Platelets 318; Potassium 3.9; Sodium 137    Lipid Panel    Component Value Date/Time   CHOL 240 (H) 02/25/2018 1052   CHOL 121 08/21/2016 0817   TRIG (H) 02/25/2018 1052    468.0 Triglyceride is over 400; calculations on Lipids are invalid.   HDL 30.50 (L) 02/25/2018 1052   HDL 24 (L) 08/21/2016 0817   CHOLHDL 8 02/25/2018 1052   VLDL UNABLE TO CALCULATE IF TRIGLYCERIDE OVER 400 mg/dL 06/28/2016 0544   LDLCALC 64 08/21/2016 0817   LDLDIRECT 162.0 02/25/2018 1052      Wt Readings from Last 3 Encounters:  09/18/18 214 lb 12 oz (97.4 kg)  09/08/18 217 lb 6.4 oz (98.6 kg)  07/01/18 223 lb 12.8 oz (101.5 kg)         ASSESSMENT AND PLAN:  1.  Coronary artery disease involving native coronary arteries with stable angina: Status post CABG in July.   Symptoms are controlled with long-acting nitroglycerin and a beta-blocker.  2. Carotid artery disease status post right carotid endarterectomy. Continue aggressive treatment of risk factors.She follows with [facility].  3. Essential hypertension: Blood pressure has been running high.  I elected to increase carvedilol to 25 mg twice daily.  Continue Imdur and spironolactone.  If blood pressure continues to be elevated, the next step is to add an ARB.  4. Hyperlipidemia: Currently on  rosuvastatin.  She has known history of severe mixed hyperlipidemia.  She needs to have repeat lipid testing done.  If LDL remains significantly elevated, I think she needs treatment with a PCSK9 inhibitor given her  extensive atherosclerosis.  5. Tobacco use: I again discussed with her the importance of complete smoking cessation. She is not ready to quit.  6.  Bilateral leg claudication with diminished pulses: I requested lower oximeter arterial Doppler.   Disposition:   FU with me in 2 months  Signed,  [name], MD  09/18/2018 9:53 AM    [place]

## 2018-09-19 ENCOUNTER — Ambulatory Visit: Payer: Self-pay | Admitting: Neurology

## 2018-09-19 ENCOUNTER — Other Ambulatory Visit: Payer: Self-pay | Admitting: Cardiovascular Disease

## 2018-09-19 DIAGNOSIS — R0989 Other specified symptoms and signs involving the circulatory and respiratory systems: Secondary | ICD-10-CM

## 2018-09-22 ENCOUNTER — Encounter (HOSPITAL_COMMUNITY): Payer: Self-pay | Admitting: Psychiatry

## 2018-09-22 ENCOUNTER — Ambulatory Visit (INDEPENDENT_AMBULATORY_CARE_PROVIDER_SITE_OTHER): Payer: BLUE CROSS/BLUE SHIELD | Admitting: Psychiatry

## 2018-09-22 VITALS — BP 180/125 | HR 93 | Ht 67.0 in | Wt 214.0 lb

## 2018-09-22 DIAGNOSIS — F331 Major depressive disorder, recurrent, moderate: Secondary | ICD-10-CM | POA: Diagnosis not present

## 2018-09-22 DIAGNOSIS — F419 Anxiety disorder, unspecified: Secondary | ICD-10-CM

## 2018-09-22 DIAGNOSIS — F4310 Post-traumatic stress disorder, unspecified: Secondary | ICD-10-CM

## 2018-09-22 DIAGNOSIS — F410 Panic disorder [episodic paroxysmal anxiety] without agoraphobia: Secondary | ICD-10-CM

## 2018-09-22 DIAGNOSIS — F17210 Nicotine dependence, cigarettes, uncomplicated: Secondary | ICD-10-CM

## 2018-09-22 MED ORDER — CLONAZEPAM 0.5 MG PO TABS: ORAL_TABLET | ORAL | 1 refills | Status: DC

## 2018-09-22 MED ORDER — BENZTROPINE MESYLATE 0.5 MG PO TABS: 0.5000 mg | ORAL_TABLET | Freq: Every day | ORAL | 1 refills | Status: DC

## 2018-09-22 MED ORDER — LAMOTRIGINE 200 MG PO TABS: 200.0000 mg | ORAL_TABLET | Freq: Every day | ORAL | 0 refills | Status: DC

## 2018-09-22 MED ORDER — CHLORPROMAZINE HCL 25 MG PO TABS: 25.0000 mg | ORAL_TABLET | Freq: Every day | ORAL | 0 refills | Status: DC

## 2018-09-22 MED ORDER — FLUOXETINE HCL 60 MG PO TABS: 60.0000 mg | ORAL_TABLET | Freq: Every day | ORAL | 0 refills | Status: DC
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
## 2018-09-24 ENCOUNTER — Ambulatory Visit: Payer: BLUE CROSS/BLUE SHIELD | Admitting: Family Medicine

## 2018-09-24 ENCOUNTER — Encounter: Payer: Self-pay | Admitting: Family Medicine

## 2018-09-24 ENCOUNTER — Other Ambulatory Visit: Payer: Self-pay

## 2018-09-24 VITALS — BP 156/102 | HR 82 | Temp 97.8°F | Ht 67.0 in | Wt 214.6 lb

## 2018-09-24 DIAGNOSIS — Z23 Encounter for immunization: Secondary | ICD-10-CM

## 2018-09-24 DIAGNOSIS — E785 Hyperlipidemia, unspecified: Secondary | ICD-10-CM

## 2018-09-24 DIAGNOSIS — F1190 Opioid use, unspecified, uncomplicated: Secondary | ICD-10-CM

## 2018-09-24 DIAGNOSIS — G8929 Other chronic pain: Secondary | ICD-10-CM

## 2018-09-24 LAB — LIPID PANEL
CHOL/HDL RATIO: 8
Cholesterol: 228 mg/dL — ABNORMAL HIGH (ref 0–200)
HDL: 27.6 mg/dL — ABNORMAL LOW (ref >39.00)
NONHDL: 200.17
Triglycerides: 358 mg/dL — ABNORMAL HIGH (ref 0.0–149.0)
VLDL: 71.6 mg/dL — AB (ref 0.0–40.0)

## 2018-09-24 LAB — LDL CHOLESTEROL, DIRECT: Direct LDL: 155 mg/dL

## 2018-09-24 MED ORDER — HYDROCODONE-ACETAMINOPHEN 7.5-325 MG PO TABS: 1.0000 | ORAL_TABLET | Freq: Four times a day (QID) | ORAL | 0 refills | Status: DC | PRN

## 2018-09-24 NOTE — Assessment & Plan Note (Signed)
>  15 minutes spent in face to face time with patient, >50% spent in counselling or coordination of care. Medication and dose: Norco 7/5- 325- 1 tab every 6 hours as needed # pills per month: 60 Last UDS date: 06/20/17 Pain contract signed (Y/N): Y Date narcotic database last reviewed (include red flags): 09/23/18  UDS/CSC updated today.

## 2018-09-24 NOTE — Progress Notes (Signed)
Subjective:   Patient ID: Erika [name], female    DOB: 07/03/1971, 47 y.o.   MRN: [id number]  Erika [name] is a pleasant 47 y.o. year old female who presents to clinic today with Follow-up (Patient is here today for a F/U.  She is aware she will have to get UDS, updated CSC, PMP ok no red flags.  She agrees to get glu shot.)  on 10[birth year]/2019year old female who presents to clinic today with Follow-up (Patient is here today for a F/U.  She is aware she will have to get UDS, updated CSC, PMP ok no red flags.  She agrees to get glu shot.)  on 09/24/2018  HPI:  Indication for chronic opioid: Cluster HA .  Has chronic hip and ankle pain as well.  Has seen Dr. Raeford Razor for this.  Now that her pain has changed in nature- calf and hip pain that is worse with walking and relieved by stress, more suspicious for PAD.  Dr. Fletcher Anon has scheduled her for ABIs.  Medication and dose: Norco 7/5- 325- 1 tab every 6 hours as needed # pills per month: 60 Last UDS date: 06/20/17 Pain contract signed (Y/N): Y Date narcotic database last reviewed (include red flags): 09/23/18   Current Outpatient Medications on File Prior to Visit  Medication Sig Dispense Refill  . albuterol (PROVENTIL HFA;VENTOLIN HFA) 108 (90 Base) MCG/ACT inhaler Inhale 2 puffs into the lungs every 6 (six) hours as needed for wheezing. 1 Inhaler 0  . aspirin EC 81 MG tablet Take 1 tablet (81 mg total) by mouth daily. 90 tablet 3  . benztropine (COGENTIN) 0.5 MG tablet Take 1 tablet (0.5 mg total) by mouth daily. 90 tablet 1  . carvedilol (COREG) 25 MG tablet Take 1 tablet (25 mg total) by mouth 2 (two) times daily with a meal. 180 tablet 0  . chlorproMAZINE (THORAZINE) 25 MG tablet Take 1 tablet (25 mg total) by mouth at bedtime. 90 tablet 0  . clonazePAM (KLONOPIN) 0.5 MG tablet Take one tab three times a day and 4th as needed 100 tablet 1  . cyclobenzaprine (FEXMID) 7.5 MG tablet TAKE 1 TABLET BY MOUTH 3 TIMES A DAY AS NEEDED FOR MUSCLE SPASMS 60 tablet 0  . Diclofenac Sodium (PENNSAID) 2 % SOLN Place 1 application onto the skin 2 (two) times daily. 1 Bottle 3  . FLUoxetine HCl 60 MG TABS Take 60 mg by mouth daily. 90 tablet 0  .  HYDROcodone-acetaminophen (NORCO) 7.5-325 MG tablet Take 1 tablet by mouth every 6 (six) hours as needed for moderate pain. 60 tablet 0  . insulin glargine (LANTUS) 100 UNIT/ML injection Inject 0.3 mLs (30 Units total) into the skin at bedtime. 10 mL 2  . Insulin Syringe-Needle U-100 29G X 1/2" 0.3 ML MISC 1 each by Does not apply route 2 (two) times daily. 30 each 0  . isosorbide mononitrate (IMDUR) 30 MG 24 hr tablet Take 1 tablet (30 mg total) by mouth daily. Please keep appointment for further refills! Thanks :) 90 tablet 0  . lamoTRIgine (LAMICTAL) 200 MG tablet Take 1 tablet (200 mg total) by mouth daily. 90 tablet 0  . nitroGLYCERIN (NITROSTAT) 0.4 MG SL tablet PLACE 1 TABLET (0.4 MG TOTAL) UNDER THE TONGUE EVERY 5 (FIVE) MINUTES AS NEEDED FOR CHEST PAIN. 25 tablet 0  . NOVOLIN 70/30 RELION (70-30) 100 UNIT/ML injection INJECT 5 UNITS SUBCUTANEOUSLY TWICE DAILY 10 mL 2  . ondansetron (ZOFRAN) 4 MG tablet Take 1 tablet (4 mg total) by mouth daily as needed. 10 tablet 2  . rosuvastatin (CRESTOR) 10 MG tablet Take 1 tablet (10 mg total) by mouth daily. 90 tablet 3  . spironolactone (ALDACTONE) 25 MG tablet Take 1 tablet (25 mg total)  by mouth daily. 30 tablet 5  . triamcinolone cream (KENALOG) 0.1 % Apply 1 application topically 2 (two) times daily. 30 g 0   No current facility-administered medications on file prior to visit.     Allergies  Allergen Reactions  . No Known Allergies     Past Medical History:  Diagnosis Date  . Arthralgia of temporomandibular joint   . CAD, multiple vessel    a. cath 06/29/16: ostLM 40%, ostLAD 40%, pLAD 95%, ost-pLCx 60%, pLCx 95%, mLCx 60%, mRCA 95%, D2 50%, LVSF nl;  b. 07/2016 CABG x 4 (LIMA->LAD, VG->Diag, VG->OM, VG->RCA); c. 08/2016 Cath: 3VD w/ 4/4 patent grafts. LAD distal to LIMA has diff dzs->Med rx.  . Carotid arterial disease ([place])    a. 07/2016 s/p R CEA.  . Depression   . Diastolic dysfunction    a. echo 06/28/16: EF 50-55%, mild inf wall HK,  GR1DD, mild MR, RV sys fxn nl, mildly dilated LA, PASP nl  . Fatty liver disease, nonalcoholic 0865  . HLD (hyperlipidemia)   . Labile hypertension    a. prior renal ngiogram negative for RAS in 03/2016; b. catecholamines and metanephrines normal, mildly elevated renin with normal aldosterone and normal ratio in 02/2016  . Obesity   . PTSD (post-traumatic stress disorder)   . Tobacco abuse    a. 2018 - cut back from 2 ppd to 0.5 ppd.  . Type 2 diabetes mellitus [facility]) January 2017    Past Surgical History:  Procedure Laterality Date  . CARDIAC CATHETERIZATION [name] 06/29/2016   Procedure: Left Heart Cath and Coronary Angiography;  Surgeon: Minna Merritts, MD;  Location: [place] [facility];  Service: Cardiovascular;  Laterality: [name];  . CARDIAC CATHETERIZATION [name] 08/29/2016   Procedure: Left Heart Cath and Cors/Grafts Angiography;  Surgeon: Wellington Hampshire, MD;  Location: [place] [facility];  Service: Cardiovascular;  Laterality: [name];  . CESAREAN SECTION    . CHOLECYSTECTOMY    . CORONARY ARTERY BYPASS GRAFT [name] 07/06/2016   Procedure: CORONARY ARTERY BYPASS GRAFTING (CABG) x four, using left internal mammary artery and right leg greater saphenous vein harvested endoscopically;  Surgeon: Ivin Poot, MD;  Location: [place];  Service: Open Heart Surgery;  Laterality: [name];  . ENDARTERECTOMY Right 07/06/2016   Procedure: ENDARTERECTOMY CAROTID;  Surgeon: Rosetta Posner, MD;  Location: [place];  Service: Vascular;  Laterality: Right;  . PERIPHERAL VASCULAR CATHETERIZATION [name] 04/18/2016   Procedure: Renal Angiography;  Surgeon: Wellington Hampshire, MD;  Location: [place] [facility];  Service: Cardiovascular;  Laterality: [name];  . TEE WITHOUT CARDIOVERSION [name] 07/06/2016   Procedure: TRANSESOPHAGEAL ECHOCARDIOGRAM (TEE);  Surgeon: Ivin Poot, MD;  Location: [place];  Service: Open Heart Surgery;  Laterality: [name];  . TONSILLECTOMY      Family History  Adopted: Yes  Problem Relation Age of Onset  .  Diabetes Mother   . Diabetes Father   . Alcohol abuse Father   . Heart disease Father   . Drug abuse Father   . Stroke Sister   . Anxiety disorder Sister     Social History   Socioeconomic History  . Marital status: Married    Spouse name: Not on file  . Number of children: Not on file  . Years of education: Not on file  . Highest education level: Not on file  Occupational History  . Not on file  Social Needs  . Financial resource strain: Not on file  . Food insecurity:  Worry: Not on file    Inability: Not on file  . Transportation needs:    Medical: Not on file    Non-medical: Not on file  Tobacco Use  . Smoking status: Current Every Day Smoker    Packs/day: 1.00    Types: Cigarettes    Last attempt to quit: 05/23/2017    Years since quitting: 1.3  . Smokeless tobacco: Never Used  . Tobacco comment: patient smokes when she drives.   Substance and Sexual Activity  . Alcohol use: No    Alcohol/week: 0.0 standard drinks  . Drug use: No  . Sexual activity: Yes    Partners: Male    Birth control/protection: None  Lifestyle  . Physical activity:    Days per week: Not on file    Minutes per session: Not on file  . Stress: Not on file  Relationships  . Social connections:    Talks on phone: Not on file    Gets together: Not on file    Attends religious service: Not on file    Active member of club or organization: Not on file    Attends meetings of clubs or organizations: Not on file    Relationship status: Not on file  . Intimate partner violence:    Fear of current or ex partner: Not on file    Emotionally abused: Not on file    Physically abused: Not on file    Forced sexual activity: Not on file  Other Topics Concern  . Not on file  Social History Narrative  . Not on file   The PMH, PSH, Social History, Family History, Medications, and allergies have been reviewed in [facility], and have been updated if relevant.   Review of Systems  Respiratory: Negative.    Cardiovascular: Negative.   Neurological: Positive for headaches. Negative for dizziness, tremors, seizures, syncope, facial asymmetry, speech difficulty, weakness, light-headedness and numbness.  All other systems reviewed and are negative.      Objective:    BP (!) 156/102 (BP Location: Left Arm, Cuff Size: Normal)   Pulse 82   Temp 97.8 F (36.6 C) (Oral)   Ht 5\' 7"  (1.702 m)   Wt 214 lb 9.6 oz (97.3 kg)   LMP 08/25/2018   SpO2 93%   BMI 33.61 kg/m    Physical Exam  Constitutional: She is oriented to person, place, and time. She appears well-developed and well-nourished. No distress.  HENT:  Head: Normocephalic and atraumatic.  Eyes: EOM are normal.  Cardiovascular: Normal rate.  Pulmonary/Chest: Effort normal.  Musculoskeletal: Normal range of motion.  Neurological: She is alert and oriented to person, place, and time. No cranial nerve deficit.  Skin: Skin is warm and dry. She is not diaphoretic.  Psychiatric: She has a normal mood and affect. Her behavior is normal. Judgment and thought content normal.  Nursing note and vitals reviewed.         Assessment & Plan:   Need for influenza vaccination - Plan: Flu Vaccine QUAD 6+ mos PF IM (Fluarix Quad PF)  Opiate use No follow-ups on file.

## 2018-09-25 ENCOUNTER — Other Ambulatory Visit: Payer: Self-pay | Admitting: Family Medicine

## 2018-09-26 ENCOUNTER — Other Ambulatory Visit: Payer: Self-pay | Admitting: Cardiovascular Disease

## 2018-09-26 DIAGNOSIS — I739 Peripheral vascular disease, unspecified: Secondary | ICD-10-CM

## 2018-09-28 LAB — PAIN MGMT, PROFILE 8 W/CONF, U
6 ACETYLMORPHINE: NEGATIVE ng/mL (ref <10)
ALCOHOL METABOLITES: NEGATIVE ng/mL (ref <500)
AMPHETAMINES: NEGATIVE ng/mL (ref <500)
Alphahydroxyalprazolam: NEGATIVE ng/mL (ref <25)
Alphahydroxymidazolam: NEGATIVE ng/mL (ref <50)
Alphahydroxytriazolam: NEGATIVE ng/mL (ref <50)
Aminoclonazepam: 114 ng/mL — ABNORMAL HIGH (ref <25)
Benzodiazepines: POSITIVE ng/mL — AB (ref <100)
Buprenorphine, Urine: NEGATIVE ng/mL (ref <5)
COCAINE METABOLITE: NEGATIVE ng/mL (ref <150)
CREATININE: 80.3 mg/dL
Hydroxyethylflurazepam: NEGATIVE ng/mL (ref <50)
LORAZEPAM: NEGATIVE ng/mL (ref <50)
MDMA: NEGATIVE ng/mL (ref <500)
Marijuana Metabolite: NEGATIVE ng/mL (ref <20)
Nordiazepam: NEGATIVE ng/mL (ref <50)
OXAZEPAM: NEGATIVE ng/mL (ref <50)
OXIDANT: NEGATIVE ug/mL (ref <200)
OXYCODONE: NEGATIVE ng/mL (ref <100)
Opiates: NEGATIVE ng/mL (ref <100)
PH: 5.99 (ref 4.5–9.0)
Temazepam: NEGATIVE ng/mL (ref <50)

## 2018-10-01 ENCOUNTER — Ambulatory Visit (INDEPENDENT_AMBULATORY_CARE_PROVIDER_SITE_OTHER): Payer: Self-pay

## 2018-10-01 DIAGNOSIS — I739 Peripheral vascular disease, unspecified: Secondary | ICD-10-CM

## 2018-10-03 ENCOUNTER — Telehealth: Payer: Self-pay | Admitting: *Deleted

## 2018-10-03 DIAGNOSIS — Z79899 Other long term (current) drug therapy: Secondary | ICD-10-CM

## 2018-10-03 DIAGNOSIS — E785 Hyperlipidemia, unspecified: Secondary | ICD-10-CM

## 2018-10-03 DIAGNOSIS — I10 Essential (primary) hypertension: Secondary | ICD-10-CM

## 2018-10-03 MED ORDER — ALIROCUMAB 75 MG/ML ~~LOC~~ SOPN: 1.0000 "pen " | PEN_INJECTOR | SUBCUTANEOUS | 11 refills | Status: DC

## 2018-10-03 NOTE — Telephone Encounter (Signed)
-----   Message from Wellington Hampshire, MD sent at 10/01/2018  5:47 PM EDT ----- Inform patient that her LDL is still significantly high in spite of treatment with rosuvastatin.  I recommend adding Praluent 75 mg every 2 weeks.  Repeat labs in 2 months.

## 2018-10-03 NOTE — Telephone Encounter (Signed)
Results called to pt. Pt verbalized understanding of results and plan of care.  She is is agreeable to Praluent injections. States her child uses a pen similar for insulin injections. She also inquired on her vascular studies which Dr Fletcher Anon has not resulted as of yet. She would like to be seen regarding these as the tech alluded that she may need further treatment. Scheduled patient to be seen 10/09/18 by Dr Fletcher Anon to discuss the results.  Rx sent to pharmacy.  Patient aware it may require prior authorization.  Patient verbalized understanding to get repeat lab work about 2 months [facility] and to be fasting. Lab orders entered.

## 2018-10-09 ENCOUNTER — Ambulatory Visit: Payer: BLUE CROSS/BLUE SHIELD | Admitting: Cardiovascular Disease

## 2018-10-09 ENCOUNTER — Encounter: Payer: Self-pay | Admitting: Cardiovascular Disease

## 2018-10-09 VITALS — BP 208/100 | HR 75 | Ht 67.0 in | Wt 214.0 lb

## 2018-10-09 DIAGNOSIS — I739 Peripheral vascular disease, unspecified: Secondary | ICD-10-CM | POA: Diagnosis not present

## 2018-10-09 DIAGNOSIS — Z720 Tobacco use: Secondary | ICD-10-CM

## 2018-10-09 DIAGNOSIS — Z01818 Encounter for other preprocedural examination: Secondary | ICD-10-CM | POA: Diagnosis not present

## 2018-10-09 DIAGNOSIS — I25118 Atherosclerotic heart disease of native coronary artery with other forms of angina pectoris: Secondary | ICD-10-CM | POA: Diagnosis not present

## 2018-10-09 DIAGNOSIS — I10 Essential (primary) hypertension: Secondary | ICD-10-CM | POA: Diagnosis not present

## 2018-10-09 DIAGNOSIS — E785 Hyperlipidemia, unspecified: Secondary | ICD-10-CM

## 2018-10-09 DIAGNOSIS — I779 Disorder of arteries and arterioles, unspecified: Secondary | ICD-10-CM

## 2018-10-09 MED ORDER — LOSARTAN POTASSIUM-HCTZ 50-12.5 MG PO TABS: 1.0000 | ORAL_TABLET | Freq: Every day | ORAL | 1 refills | Status: DC

## 2018-10-09 NOTE — H&P (View-Only) (Signed)
Cardiology Office Note   Date:  10/09/2018   ID:  Erika [name], DOB 02/22/1971, MRN [id number]  PCP:  Lucille Passy, MD Cardiologist:   [name], MD   Chief Complaint  Patient presents with  . OTHER    [name]. Meds reviewed verbally with pt.      History of Present Illness: Erika [name] is a 47 y.o. female who presents for a follow-up visit regarding coronary artery disease .[name] with no evidence of secondary hypertension. She presented in July, 2017 with non-ST elevation myocardial infarction. She underwent cardiac catheterization which showed severe three-vessel coronary artery disease.  Preop carotid Doppler showed 80-99% right ICA stenosis. She underwent CABG and right carotid endarterectomy at the same time in July of 2017.  She was rehospitalized in September, 2017 with chest pain in the setting of uncontrolled hypertension. Cardiac catheterization showed patent grafts . The LAD had diffuse disease distal to the anastomosis and was very small in caliber. Ejection fraction was normal with mildly elevated left ventricular end-diastolic pressure.   She has history of difficult to control hypertension with labile blood pressure.  Most recently, her blood pressure has been elevated most of the time.    Unfortunately, she continues to smoke.  She has known history of severe mixed hyperlipidemia . During last visit, I increased her Coreg to 25 mg twice daily.  She complained of significant bilateral leg claudication.  I referred her for noninvasive vascular studies which showed mildly reduced ABI bilaterally.  Duplex showed short occlusion of the left mid SFA as well as severe stenosis affecting the right SFA.  She is very limited by claudication even after walking short distance.  She has to stop for a few minutes before she can resume.   Past Medical History:  Diagnosis Date  . Arthralgia of  temporomandibular joint   . CAD, multiple vessel    a. cath 06/29/16: ostLM 40%, ostLAD 40%, pLAD 95%, ost-pLCx 60%, pLCx 95%, mLCx 60%, mRCA 95%, D2 50%, LVSF nl;  b. 07/2016 CABG x 4 (LIMA->LAD, VG->Diag, VG->OM, VG->RCA); c. 08/2016 Cath: 3VD w/ 4/4 patent grafts. LAD distal to LIMA has diff dzs->Med rx.  . Carotid arterial disease ([place])    a. 07/2016 s/p R CEA.  . Depression   . Diastolic dysfunction    a. echo 06/28/16: EF 50-55%, mild inf wall HK, GR1DD, mild MR, RV sys fxn nl, mildly dilated LA, PASP nl  . Fatty liver disease, nonalcoholic 7001  . HLD (hyperlipidemia)   . Labile hypertension    a. prior renal ngiogram negative for RAS in 03/2016; b. catecholamines and metanephrines normal, mildly elevated renin with normal aldosterone and normal ratio in 02/2016  . Obesity   . PTSD (post-traumatic stress disorder)   . Tobacco abuse    a. 2018 - cut back from 2 ppd to 0.5 ppd.  . Type 2 diabetes mellitus [facility]) January 2017    Past Surgical History:  Procedure Laterality Date  . CARDIAC CATHETERIZATION [name] 06/29/2016   Procedure: Left Heart Cath and Coronary Angiography;  Surgeon: Minna Merritts, MD;  Location: [place] [facility] LAB;  Service: Cardiovascular;  Laterality: [name];  . CARDIAC CATHETERIZATION [name] 08/29/2016   Procedure: Left Heart Cath and Cors/Grafts Angiography;  Surgeon: Wellington Hampshire, MD;  Location: [place] [facility];  Service: Cardiovascular;  Laterality: [name];  . CESAREAN SECTION    . CHOLECYSTECTOMY    .  CORONARY ARTERY BYPASS GRAFT [name] 07/06/2016   Procedure: CORONARY ARTERY BYPASS GRAFTING (CABG) x four, using left internal mammary artery and right leg greater saphenous vein harvested endoscopically;  Surgeon: Ivin Poot, MD;  Location: [place];  Service: Open Heart Surgery;  Laterality: [name];  . ENDARTERECTOMY Right 07/06/2016   Procedure: ENDARTERECTOMY CAROTID;  Surgeon: Rosetta Posner, MD;  Location: [place];  Service: Vascular;  Laterality: Right;  . PERIPHERAL  VASCULAR CATHETERIZATION [name] 04/18/2016   Procedure: Renal Angiography;  Surgeon: Wellington Hampshire, MD;  Location: [place] [facility] LAB;  Service: Cardiovascular;  Laterality: [name];  . TEE WITHOUT CARDIOVERSION [name] 07/06/2016   Procedure: TRANSESOPHAGEAL ECHOCARDIOGRAM (TEE);  Surgeon: Ivin Poot, MD;  Location: [place];  Service: Open Heart Surgery;  Laterality: [name];  . TONSILLECTOMY       Current Outpatient Medications  Medication Sig Dispense Refill  . albuterol (PROVENTIL HFA;VENTOLIN HFA) 108 (90 Base) MCG/ACT inhaler Inhale 2 puffs into the lungs every 6 (six) hours as needed for wheezing. 1 Inhaler 0  . aspirin EC 81 MG tablet Take 1 tablet (81 mg total) by mouth daily. 90 tablet 3  . benztropine (COGENTIN) 0.5 MG tablet Take 1 tablet (0.5 mg total) by mouth daily. 90 tablet 1  . carvedilol (COREG) 25 MG tablet Take 1 tablet (25 mg total) by mouth 2 (two) times daily with a meal. 180 tablet 0  . chlorproMAZINE (THORAZINE) 25 MG tablet Take 1 tablet (25 mg total) by mouth at bedtime. 90 tablet 0  . clonazePAM (KLONOPIN) 0.5 MG tablet Take one tab three times a day and 4th as needed 100 tablet 1  . cyclobenzaprine (FEXMID) 7.5 MG tablet TAKE 1 TABLET BY MOUTH 3 TIMES A DAY AS NEEDED FOR MUSCLE SPASMS 60 tablet 2  . Diclofenac Sodium (PENNSAID) 2 % SOLN Place 1 application onto the skin 2 (two) times daily. 1 Bottle 3  . FLUoxetine HCl 60 MG TABS Take 60 mg by mouth daily. 90 tablet 0  . HYDROcodone-acetaminophen (NORCO) 7.5-325 MG tablet Take 1 tablet by mouth every 6 (six) hours as needed for moderate pain. 60 tablet 0  . insulin glargine (LANTUS) 100 UNIT/ML injection Inject 0.3 mLs (30 Units total) into the skin at bedtime. 10 mL 2  . Insulin Syringe-Needle U-100 29G X 1/2" 0.3 ML MISC 1 each by Does not apply route 2 (two) times daily. 30 each 0  . isosorbide mononitrate (IMDUR) 30 MG 24 hr tablet Take 1 tablet (30 mg total) by mouth daily. Please keep appointment for further refills!  Thanks :) 90 tablet 0  . lamoTRIgine (LAMICTAL) 200 MG tablet Take 1 tablet (200 mg total) by mouth daily. 90 tablet 0  . nitroGLYCERIN (NITROSTAT) 0.4 MG SL tablet PLACE 1 TABLET (0.4 MG TOTAL) UNDER THE TONGUE EVERY 5 (FIVE) MINUTES AS NEEDED FOR CHEST PAIN. 25 tablet 0  . NOVOLIN 70/30 RELION (70-30) 100 UNIT/ML injection INJECT 5 UNITS SUBCUTANEOUSLY TWICE DAILY 10 mL 2  . ondansetron (ZOFRAN) 4 MG tablet Take 1 tablet (4 mg total) by mouth daily as needed. 10 tablet 2  . rosuvastatin (CRESTOR) 10 MG tablet Take 1 tablet (10 mg total) by mouth daily. 90 tablet 3  . spironolactone (ALDACTONE) 25 MG tablet Take 1 tablet (25 mg total) by mouth daily. 30 tablet 5  . triamcinolone cream (KENALOG) 0.1 % Apply 1 application topically 2 (two) times daily. 30 g 0  . Alirocumab (PRALUENT) 75 MG/ML SOPN Inject 1 pen  into the skin every 14 (fourteen) days. (Patient not taking: Reported on 10/09/2018) 2 pen 11   No current facility-administered medications for this visit.     Allergies:   No known allergies    Social History:  The patient  reports that she has been smoking cigarettes. She has been smoking about 1.00 pack per day. She has never used smokeless tobacco. She reports that she does not drink alcohol or use drugs.   Family History:  The patient's family history includes Alcohol abuse in her father; Anxiety disorder in her sister; Diabetes in her father and mother; Drug abuse in her father; Heart disease in her father; Stroke in her sister. She was adopted.    ROS:  Please see the history of present illness.   Otherwise, review of systems are positive for none.   All other systems are reviewed and negative.    PHYSICAL EXAM: VS:  BP (!) 208/100 (BP Location: Left Arm, Patient Position: Sitting, Cuff Size: Normal)   Pulse 75   Ht 5\' 7"  (1.702 m)   Wt 214 lb (97.1 kg)   BMI 33.52 kg/m  , BMI Body mass index is 33.52 kg/m. GEN: Well nourished, well developed, in no acute distress    HEENT: normal  Neck: no JVD, carotid bruits, or masses Cardiac: RRR; no  rubs, or gallops,no edema . There is 1/6 systolic flow murmur in the pulmonic area Respiratory:  clear to auscultation bilaterally, normal work of breathing GI: soft, nontender, nondistended, + BS MS: no deformity or atrophy  Skin: warm and dry, no rash Neuro:  Strength and sensation are intact Psych: euthymic mood, full affect Vascular: Femoral pulses are slightly diminished bilaterally.  Dorsalis pedis is palpable on both sides but mildly diminished.   EKG:  EKG is  ordered today. EKG showed normal sinus rhythm with old septal infarct.  Inferolateral ST changes suggestive of ischemia.   Recent Labs: 02/25/2018: TSH 1.68 06/24/2018: ALT 18 06/30/2018: B Natriuretic Peptide 21.0; BUN 10; Creatinine, Ser 0.57; Hemoglobin 14.3; Platelets 318; Potassium 3.9; Sodium 137    Lipid Panel    Component Value Date/Time   CHOL 228 (H) 09/24/2018 0819   CHOL 121 08/21/2016 0817   TRIG 358.0 (H) 09/24/2018 0819   HDL 27.60 (L) 09/24/2018 0819   HDL 24 (L) 08/21/2016 0817   CHOLHDL 8 09/24/2018 0819   VLDL 71.6 (H) 09/24/2018 0819   LDLCALC 64 08/21/2016 0817   LDLDIRECT 155.0 09/24/2018 0819      Wt Readings from Last 3 Encounters:  10/09/18 214 lb (97.1 kg)  09/24/18 214 lb 9.6 oz (97.3 kg)  09/18/18 214 lb 12 oz (97.4 kg)         ASSESSMENT AND PLAN:  1.  Peripheral arterial disease with severe bilateral calf claudication: I discussed with her the natural history and management of peripheral arterial disease.  I discussed with her the importance of controlling her risk factors and healthy lifestyle changes. I explained to her that this is not a limb threatening situation at the present time.  Nonetheless, she feels extremely limited by this and she wants to improve her functional capacity.  Due to that, I recommend proceeding with abdominal aortogram, lower extremity runoff and possible endovascular  intervention. Planned access is via the right common femoral artery.  I discussed the procedure in details as well as risks and benefits.  2. Coronary artery disease involving native coronary arteries with stable angina: Status post CABG in July.  Symptoms are controlled with long-acting nitroglycerin and a beta-blocker.  3. Carotid artery disease status post right carotid endarterectomy. Continue aggressive treatment of risk factors.She follows with [facility].  4. Essential hypertension: Blood pressure continues to be elevated.  I elected to add losartan-hydrochlorothiazide 50-12.5 mg once daily.  Continue carvedilol, Imdur and spironolactone.   We will check her renal arteries during angiography.  5. Hyperlipidemia: Currently on rosuvastatin.  She was recently started on Praluent but has not started the medication yet.  6. Tobacco use: I again discussed with her the importance of complete smoking cessation.  I suggested that she and her husband both quit at the same time.    Disposition:   FU with me in 1 month  Signed,  [name], MD  10/09/2018 3:54 PM    [place]

## 2018-10-09 NOTE — Patient Instructions (Signed)
Medication Instructions:  START Losartan-HCTZ 50-12.5 mg daily  If you need a refill on your cardiac medications before your next appointment, please call your pharmacy.   Lab work: Your provider would like for you to have the following labs today: CBC and BMET  If you have labs (blood work) drawn today and your tests are completely normal, you will receive your results only by: Marland Kitchen MyChart Message (if you have MyChart) OR . A paper copy in the mail If you have any lab test that is abnormal or we need to change your treatment, we will call you to review the results.  Follow-Up: Your physician recommends that you schedule a follow-up appointment in: one month with Dr. Fletcher Anon. We will call you to set this up after the procedure.      [address] [place], [place] [address] Dept: [phone] Loc: [place]  10/09/2018  You are scheduled for a Peripheral Angiogram on Wednesday, October 23 with Dr. [name].  1. Please arrive at the [facility] (Main Entrance A) at [facility]: [name] [place], [place] [address] at 8:30 AM (This time is two hours before your procedure to ensure your preparation). Free valet parking service is available.   Special note: Every effort is made to have your procedure done on time. Please understand that emergencies sometimes delay scheduled procedures.  2. Diet: Do not eat solid foods after midnight.  The patient may have clear liquids until 5am upon the day of the procedure.  3. Labs: You will need to have blood drawn today at the [facility] clinic  4. Medication instructions in preparation for your procedure: Hold all diabetic medication the morning of the procedure Take half the dose of the Lantus the night before the procedure Hold the spironolactone the morning of the procedure   On the morning of your  procedure, take your Aspirin and any morning medicines NOT listed above.  You may use sips of water.  5. Plan for one night stay--bring personal belongings. 6. Bring a current list of your medications and current insurance cards. 7. You MUST have a responsible person to drive you home. 8. Someone MUST be with you the first 24 hours after you arrive home or your discharge will be delayed. 9. Please wear clothes that are easy to get on and off and wear slip-on shoes.  Thank you for allowing [name] to care for you!   --  Invasive Cardiovascular services

## 2018-10-09 NOTE — Progress Notes (Signed)
Cardiology Office Note   Date:  10/09/2018   ID:  Erika [name], DOB [birth year], MRN [id number]  PCP:  Lucille Passy, MD Cardiologist:   [name], MD   Chief Complaint  Patient presents with  . OTHER    [name]. Meds reviewed verbally with pt.      History of Present Illness: Erika [name] is a 47 y.o. female who presents for a follow-up visit regarding coronary artery disease .[name] with no evidence of secondary hypertension. She presented in July, 2017 with non-ST elevation myocardial infarction. She underwent cardiac catheterization which showed severe three-vessel coronary artery disease.  Preop carotid Doppler showed 80-99% right ICA stenosis. She underwent CABG and right carotid endarterectomy at the same time in July of 2017.  She was rehospitalized in September, 2017 with chest pain in the setting of uncontrolled hypertension. Cardiac catheterization showed patent grafts . The LAD had diffuse disease distal to the anastomosis and was very small in caliber. Ejection fraction was normal with mildly elevated left ventricular end-diastolic pressure.   She has history of difficult to control hypertension with labile blood pressure.  Most recently, her blood pressure has been elevated most of the time.    Unfortunately, she continues to smoke.  She has known history of severe mixed hyperlipidemia . During last visit, I increased her Coreg to 25 mg twice daily.  She complained of significant bilateral leg claudication.  I referred her for noninvasive vascular studies which showed mildly reduced ABI bilaterally.  Duplex showed short occlusion of the left mid SFA as well as severe stenosis affecting the right SFA.  She is very limited by claudication even after walking short distance.  She has to stop for a few minutes before she can resume.   Past Medical History:  Diagnosis Date  . Arthralgia of  temporomandibular joint   . CAD, multiple vessel    a. cath 06/29/16: ostLM 40%, ostLAD 40%, pLAD 95%, ost-pLCx 60%, pLCx 95%, mLCx 60%, mRCA 95%, D2 50%, LVSF nl;  b. 07/2016 CABG x 4 (LIMA->LAD, VG->Diag, VG->OM, VG->RCA); c. 08/2016 Cath: 3VD w/ 4/4 patent grafts. LAD distal to LIMA has diff dzs->Med rx.  . Carotid arterial disease ([place])    a. 07/2016 s/p R CEA.  . Depression   . Diastolic dysfunction    a. echo 06/28/16: EF 50-55%, mild inf wall HK, GR1DD, mild MR, RV sys fxn nl, mildly dilated LA, PASP nl  . Fatty liver disease, nonalcoholic 4696  . HLD (hyperlipidemia)   . Labile hypertension    a. prior renal ngiogram negative for RAS in 03/2016; b. catecholamines and metanephrines normal, mildly elevated renin with normal aldosterone and normal ratio in 02/2016  . Obesity   . PTSD (post-traumatic stress disorder)   . Tobacco abuse    a. 2018 - cut back from 2 ppd to 0.5 ppd.  . Type 2 diabetes mellitus [facility]) January 2017    Past Surgical History:  Procedure Laterality Date  . CARDIAC CATHETERIZATION [name] 06/29/2016   Procedure: Left Heart Cath and Coronary Angiography;  Surgeon: Minna Merritts, MD;  Location: [place] [facility];  Service: Cardiovascular;  Laterality: [name];  . CARDIAC CATHETERIZATION [name] 08/29/2016   Procedure: Left Heart Cath and Cors/Grafts Angiography;  Surgeon: Wellington Hampshire, MD;  Location: [place] [facility];  Service: Cardiovascular;  Laterality: [name];  . CESAREAN SECTION    . CHOLECYSTECTOMY    .  CORONARY ARTERY BYPASS GRAFT [name] 07/06/2016   Procedure: CORONARY ARTERY BYPASS GRAFTING (CABG) x four, using left internal mammary artery and right leg greater saphenous vein harvested endoscopically;  Surgeon: Ivin Poot, MD;  Location: [place];  Service: Open Heart Surgery;  Laterality: [name];  . ENDARTERECTOMY Right 07/06/2016   Procedure: ENDARTERECTOMY CAROTID;  Surgeon: Rosetta Posner, MD;  Location: [place];  Service: Vascular;  Laterality: Right;  . PERIPHERAL  VASCULAR CATHETERIZATION [name] 04/18/2016   Procedure: Renal Angiography;  Surgeon: Wellington Hampshire, MD;  Location: [place] [facility];  Service: Cardiovascular;  Laterality: [name];  . TEE WITHOUT CARDIOVERSION [name] 07/06/2016   Procedure: TRANSESOPHAGEAL ECHOCARDIOGRAM (TEE);  Surgeon: Ivin Poot, MD;  Location: [place];  Service: Open Heart Surgery;  Laterality: [name];  . TONSILLECTOMY       Current Outpatient Medications  Medication Sig Dispense Refill  . albuterol (PROVENTIL HFA;VENTOLIN HFA) 108 (90 Base) MCG/ACT inhaler Inhale 2 puffs into the lungs every 6 (six) hours as needed for wheezing. 1 Inhaler 0  . aspirin EC 81 MG tablet Take 1 tablet (81 mg total) by mouth daily. 90 tablet 3  . benztropine (COGENTIN) 0.5 MG tablet Take 1 tablet (0.5 mg total) by mouth daily. 90 tablet 1  . carvedilol (COREG) 25 MG tablet Take 1 tablet (25 mg total) by mouth 2 (two) times daily with a meal. 180 tablet 0  . chlorproMAZINE (THORAZINE) 25 MG tablet Take 1 tablet (25 mg total) by mouth at bedtime. 90 tablet 0  . clonazePAM (KLONOPIN) 0.5 MG tablet Take one tab three times a day and 4th as needed 100 tablet 1  . cyclobenzaprine (FEXMID) 7.5 MG tablet TAKE 1 TABLET BY MOUTH 3 TIMES A DAY AS NEEDED FOR MUSCLE SPASMS 60 tablet 2  . Diclofenac Sodium (PENNSAID) 2 % SOLN Place 1 application onto the skin 2 (two) times daily. 1 Bottle 3  . FLUoxetine HCl 60 MG TABS Take 60 mg by mouth daily. 90 tablet 0  . HYDROcodone-acetaminophen (NORCO) 7.5-325 MG tablet Take 1 tablet by mouth every 6 (six) hours as needed for moderate pain. 60 tablet 0  . insulin glargine (LANTUS) 100 UNIT/ML injection Inject 0.3 mLs (30 Units total) into the skin at bedtime. 10 mL 2  . Insulin Syringe-Needle U-100 29G X 1/2" 0.3 ML MISC 1 each by Does not apply route 2 (two) times daily. 30 each 0  . isosorbide mononitrate (IMDUR) 30 MG 24 hr tablet Take 1 tablet (30 mg total) by mouth daily. Please keep appointment for further refills!  Thanks :) 90 tablet 0  . lamoTRIgine (LAMICTAL) 200 MG tablet Take 1 tablet (200 mg total) by mouth daily. 90 tablet 0  . nitroGLYCERIN (NITROSTAT) 0.4 MG SL tablet PLACE 1 TABLET (0.4 MG TOTAL) UNDER THE TONGUE EVERY 5 (FIVE) MINUTES AS NEEDED FOR CHEST PAIN. 25 tablet 0  . NOVOLIN 70/30 RELION (70-30) 100 UNIT/ML injection INJECT 5 UNITS SUBCUTANEOUSLY TWICE DAILY 10 mL 2  . ondansetron (ZOFRAN) 4 MG tablet Take 1 tablet (4 mg total) by mouth daily as needed. 10 tablet 2  . rosuvastatin (CRESTOR) 10 MG tablet Take 1 tablet (10 mg total) by mouth daily. 90 tablet 3  . spironolactone (ALDACTONE) 25 MG tablet Take 1 tablet (25 mg total) by mouth daily. 30 tablet 5  . triamcinolone cream (KENALOG) 0.1 % Apply 1 application topically 2 (two) times daily. 30 g 0  . Alirocumab (PRALUENT) 75 MG/ML SOPN Inject 1 pen  into the skin every 14 (fourteen) days. (Patient not taking: Reported on 10/09/2018) 2 pen 11   No current facility-administered medications for this visit.     Allergies:   No known allergies    Social History:  The patient  reports that she has been smoking cigarettes. She has been smoking about 1.00 pack per day. She has never used smokeless tobacco. She reports that she does not drink alcohol or use drugs.   Family History:  The patient's family history includes Alcohol abuse in her father; Anxiety disorder in her sister; Diabetes in her father and mother; Drug abuse in her father; Heart disease in her father; Stroke in her sister. She was adopted.    ROS:  Please see the history of present illness.   Otherwise, review of systems are positive for none.   All other systems are reviewed and negative.    PHYSICAL EXAM: VS:  BP (!) 208/100 (BP Location: Left Arm, Patient Position: Sitting, Cuff Size: Normal)   Pulse 75   Ht 5\' 7"  (1.702 m)   Wt 214 lb (97.1 kg)   BMI 33.52 kg/m  , BMI Body mass index is 33.52 kg/m. GEN: Well nourished, well developed, in no acute distress    HEENT: normal  Neck: no JVD, carotid bruits, or masses Cardiac: RRR; no  rubs, or gallops,no edema . There is 1/6 systolic flow murmur in the pulmonic area Respiratory:  clear to auscultation bilaterally, normal work of breathing GI: soft, nontender, nondistended, + BS MS: no deformity or atrophy  Skin: warm and dry, no rash Neuro:  Strength and sensation are intact Psych: euthymic mood, full affect Vascular: Femoral pulses are slightly diminished bilaterally.  Dorsalis pedis is palpable on both sides but mildly diminished.   EKG:  EKG is  ordered today. EKG showed normal sinus rhythm with old septal infarct.  Inferolateral ST changes suggestive of ischemia.   Recent Labs: 02/25/2018: TSH 1.68 06/24/2018: ALT 18 06/30/2018: B Natriuretic Peptide 21.0; BUN 10; Creatinine, Ser 0.57; Hemoglobin 14.3; Platelets 318; Potassium 3.9; Sodium 137    Lipid Panel    Component Value Date/Time   CHOL 228 (H) 09/24/2018 0819   CHOL 121 08/21/2016 0817   TRIG 358.0 (H) 09/24/2018 0819   HDL 27.60 (L) 09/24/2018 0819   HDL 24 (L) 08/21/2016 0817   CHOLHDL 8 09/24/2018 0819   VLDL 71.6 (H) 09/24/2018 0819   LDLCALC 64 08/21/2016 0817   LDLDIRECT 155.0 09/24/2018 0819      Wt Readings from Last 3 Encounters:  10/09/18 214 lb (97.1 kg)  09/24/18 214 lb 9.6 oz (97.3 kg)  09/18/18 214 lb 12 oz (97.4 kg)         ASSESSMENT AND PLAN:  1.  Peripheral arterial disease with severe bilateral calf claudication: I discussed with her the natural history and management of peripheral arterial disease.  I discussed with her the importance of controlling her risk factors and healthy lifestyle changes. I explained to her that this is not a limb threatening situation at the present time.  Nonetheless, she feels extremely limited by this and she wants to improve her functional capacity.  Due to that, I recommend proceeding with abdominal aortogram, lower extremity runoff and possible endovascular  intervention. Planned access is via the right common femoral artery.  I discussed the procedure in details as well as risks and benefits.  2. Coronary artery disease involving native coronary arteries with stable angina: Status post CABG in July.  Symptoms are controlled with long-acting nitroglycerin and a beta-blocker.  3. Carotid artery disease status post right carotid endarterectomy. Continue aggressive treatment of risk factors.She follows with [facility].  4. Essential hypertension: Blood pressure continues to be elevated.  I elected to add losartan-hydrochlorothiazide 50-12.5 mg once daily.  Continue carvedilol, Imdur and spironolactone.   We will check her renal arteries during angiography.  5. Hyperlipidemia: Currently on rosuvastatin.  She was recently started on Praluent but has not started the medication yet.  6. Tobacco use: I again discussed with her the importance of complete smoking cessation.  I suggested that she and her husband both quit at the same time.    Disposition:   FU with me in 1 month  Signed,  [name], MD  10/09/2018 3:54 PM    [place]

## 2018-10-10 ENCOUNTER — Other Ambulatory Visit
Admission: RE | Admit: 2018-10-10 | Discharge: 2018-10-10 | Disposition: A | Discharge status: Home / Self Care | Payer: BLUE CROSS/BLUE SHIELD | Source: Ambulatory Visit | Attending: Cardiovascular Disease | Admitting: Cardiovascular Disease

## 2018-10-10 ENCOUNTER — Telehealth: Payer: Self-pay | Admitting: *Deleted

## 2018-10-10 DIAGNOSIS — Z01818 Encounter for other preprocedural examination: Secondary | ICD-10-CM | POA: Insufficient documentation

## 2018-10-10 DIAGNOSIS — I739 Peripheral vascular disease, unspecified: Secondary | ICD-10-CM | POA: Insufficient documentation

## 2018-10-10 DIAGNOSIS — Z79899 Other long term (current) drug therapy: Secondary | ICD-10-CM | POA: Diagnosis not present

## 2018-10-10 DIAGNOSIS — I10 Essential (primary) hypertension: Secondary | ICD-10-CM | POA: Diagnosis not present

## 2018-10-10 DIAGNOSIS — E785 Hyperlipidemia, unspecified: Secondary | ICD-10-CM | POA: Diagnosis not present

## 2018-10-10 LAB — HEPATIC FUNCTION PANEL
ALBUMIN: 3.7 g/dL (ref 3.5–5.0)
ALT: 67 U/L — AB (ref 0–44)
AST: 35 U/L (ref 15–41)
Alkaline Phosphatase: 90 U/L (ref 38–126)
TOTAL PROTEIN: 7.2 g/dL (ref 6.5–8.1)
Total Bilirubin: 0.3 mg/dL (ref 0.3–1.2)

## 2018-10-10 LAB — BASIC METABOLIC PANEL
ANION GAP: 10 (ref 5–15)
BUN: 9 mg/dL (ref 6–20)
CO2: 27 mmol/L (ref 22–32)
Calcium: 9.6 mg/dL (ref 8.9–10.3)
Chloride: 96 mmol/L — ABNORMAL LOW (ref 98–111)
Creatinine, Ser: 0.74 mg/dL (ref 0.44–1.00)
GFR calc Af Amer: >60 mL/min (ref >60)
GLUCOSE: 266 mg/dL — AB (ref 70–99)
POTASSIUM: 4 mmol/L (ref 3.5–5.1)
Sodium: 133 mmol/L — ABNORMAL LOW (ref 135–145)

## 2018-10-10 LAB — LIPID PANEL
CHOL/HDL RATIO: 9.4 ratio
Cholesterol: 244 mg/dL — ABNORMAL HIGH (ref 0–200)
HDL: 26 mg/dL — ABNORMAL LOW (ref >40)
LDL CALC: UNDETERMINED mg/dL (ref 0–99)
Triglycerides: 473 mg/dL — ABNORMAL HIGH (ref <150)
VLDL: UNDETERMINED mg/dL (ref 0–40)

## 2018-10-10 LAB — CBC
HCT: 46.1 % — ABNORMAL HIGH (ref 36.0–46.0)
Hemoglobin: 15.5 g/dL — ABNORMAL HIGH (ref 12.0–15.0)
MCH: 29.6 pg (ref 26.0–34.0)
MCHC: 33.6 g/dL (ref 30.0–36.0)
MCV: 88.1 fL (ref 80.0–100.0)
PLATELETS: 361 10*3/uL (ref 150–400)
RBC: 5.23 MIL/uL — AB (ref 3.87–5.11)
RDW: 12.9 % (ref 11.5–15.5)
WBC: 10.1 10*3/uL (ref 4.0–10.5)
nRBC: 0 % (ref 0.0–0.2)

## 2018-10-10 MED ORDER — LOSARTAN POTASSIUM 50 MG PO TABS: 50.0000 mg | ORAL_TABLET | Freq: Every day | ORAL | 0 refills | Status: DC

## 2018-10-10 NOTE — Telephone Encounter (Signed)
-----   Message from Wellington Hampshire, MD sent at 10/10/2018  2:26 PM EDT ----- Stable pre-angiogram labs.  However, her sodium is mildly decreased.  We started her yesterday on losartan-hydrochlorothiazide but given that her sodium is already on the low side, I want to stop hydrochlorothiazide and leave her on losartan 50 mg daily.

## 2018-10-10 NOTE — Telephone Encounter (Signed)
Patient's husband made aware of results, per dpr and patient.  Losartan 50 mg has been sent in to the pharmacy.

## 2018-10-13 ENCOUNTER — Telehealth: Payer: Self-pay | Admitting: *Deleted

## 2018-10-13 NOTE — Telephone Encounter (Signed)
Pt contacted pre-PV procedure scheduled for Wednesday October 15, 2018 10:30 AM Verified arrival time and place: [place] Entrance A at: 8:30 AM  No solid food after midnight prior to cath, clear liquids until 5 AM day of procedure. Verified no contrast allergy.  Hold: Insulin-AM of procedure. 1/2 Insulin PM prior to procedure. Spironolactone-AM of procedure.  Except hold medications AM meds can be  taken pre-cath with sip of water including: ASA 81 mg  Confirmed patient has responsible person to drive home post procedure and for 24 hours after you arrive home: yes

## 2018-10-15 ENCOUNTER — Ambulatory Visit (HOSPITAL_COMMUNITY)
Admission: RE | Admit: 2018-10-15 | Discharge: 2018-10-15 | Disposition: A | Discharge status: Home / Self Care | Payer: BLUE CROSS/BLUE SHIELD | Source: Ambulatory Visit | Attending: Cardiovascular Disease | Admitting: Cardiovascular Disease

## 2018-10-15 ENCOUNTER — Ambulatory Visit (HOSPITAL_COMMUNITY)
Admission: RE | Disposition: A | Discharge status: Home / Self Care | Payer: Self-pay | Source: Ambulatory Visit | Attending: Cardiovascular Disease

## 2018-10-15 ENCOUNTER — Other Ambulatory Visit: Payer: Self-pay

## 2018-10-15 ENCOUNTER — Telehealth: Payer: Self-pay | Admitting: *Deleted

## 2018-10-15 DIAGNOSIS — I7092 Chronic total occlusion of artery of the extremities: Secondary | ICD-10-CM | POA: Diagnosis not present

## 2018-10-15 DIAGNOSIS — I70212 Atherosclerosis of native arteries of extremities with intermittent claudication, left leg: Secondary | ICD-10-CM | POA: Insufficient documentation

## 2018-10-15 DIAGNOSIS — Z951 Presence of aortocoronary bypass graft: Secondary | ICD-10-CM | POA: Insufficient documentation

## 2018-10-15 DIAGNOSIS — F329 Major depressive disorder, single episode, unspecified: Secondary | ICD-10-CM | POA: Insufficient documentation

## 2018-10-15 DIAGNOSIS — E782 Mixed hyperlipidemia: Secondary | ICD-10-CM | POA: Diagnosis not present

## 2018-10-15 DIAGNOSIS — I252 Old myocardial infarction: Secondary | ICD-10-CM | POA: Insufficient documentation

## 2018-10-15 DIAGNOSIS — I2510 Atherosclerotic heart disease of native coronary artery without angina pectoris: Secondary | ICD-10-CM | POA: Diagnosis not present

## 2018-10-15 DIAGNOSIS — E1151 Type 2 diabetes mellitus with diabetic peripheral angiopathy without gangrene: Secondary | ICD-10-CM | POA: Diagnosis not present

## 2018-10-15 DIAGNOSIS — Z6833 Body mass index (BMI) 33.0-33.9, adult: Secondary | ICD-10-CM | POA: Diagnosis not present

## 2018-10-15 DIAGNOSIS — Z7982 Long term (current) use of aspirin: Secondary | ICD-10-CM | POA: Insufficient documentation

## 2018-10-15 DIAGNOSIS — Z8249 Family history of ischemic heart disease and other diseases of the circulatory system: Secondary | ICD-10-CM | POA: Diagnosis not present

## 2018-10-15 DIAGNOSIS — Z794 Long term (current) use of insulin: Secondary | ICD-10-CM | POA: Diagnosis not present

## 2018-10-15 DIAGNOSIS — F4310 Post-traumatic stress disorder, unspecified: Secondary | ICD-10-CM | POA: Diagnosis not present

## 2018-10-15 DIAGNOSIS — I10 Essential (primary) hypertension: Secondary | ICD-10-CM | POA: Insufficient documentation

## 2018-10-15 DIAGNOSIS — F17210 Nicotine dependence, cigarettes, uncomplicated: Secondary | ICD-10-CM | POA: Diagnosis not present

## 2018-10-15 DIAGNOSIS — I739 Peripheral vascular disease, unspecified: Secondary | ICD-10-CM

## 2018-10-15 DIAGNOSIS — Y831 Surgical operation with implant of artificial internal device as the cause of abnormal reaction of the patient, or of later complication, without mention of misadventure at the time of the procedure: Secondary | ICD-10-CM | POA: Insufficient documentation

## 2018-10-15 DIAGNOSIS — T82856A Stenosis of peripheral vascular stent, initial encounter: Secondary | ICD-10-CM | POA: Insufficient documentation

## 2018-10-15 DIAGNOSIS — E669 Obesity, unspecified: Secondary | ICD-10-CM | POA: Diagnosis not present

## 2018-10-15 HISTORY — PX: PERIPHERAL VASCULAR INTERVENTION: CATH118257

## 2018-10-15 HISTORY — PX: ABDOMINAL AORTOGRAM W/LOWER EXTREMITY: CATH118223

## 2018-10-15 LAB — GLUCOSE, CAPILLARY: GLUCOSE-CAPILLARY: 284 mg/dL — AB (ref 70–99)

## 2018-10-15 LAB — POCT ACTIVATED CLOTTING TIME: Activated Clotting Time: 252 seconds

## 2018-10-15 SURGERY — ABDOMINAL AORTOGRAM W/LOWER EXTREMITY
Anesthesia: LOCAL

## 2018-10-15 MED ORDER — ONDANSETRON HCL 4 MG/2ML IJ SOLN: 4.0000 mg | Freq: Four times a day (QID) | INTRAMUSCULAR | Status: DC | PRN

## 2018-10-15 MED ORDER — ASPIRIN 81 MG PO CHEW: 81.0000 mg | CHEWABLE_TABLET | ORAL | Status: DC

## 2018-10-15 MED ORDER — MIDAZOLAM HCL 2 MG/2ML IJ SOLN
INTRAMUSCULAR | Status: DC | PRN
  Administered 2018-10-15 (×3): 1 mg via INTRAVENOUS
  Administered 2018-10-15: 2 mg via INTRAVENOUS

## 2018-10-15 MED ORDER — CLOPIDOGREL BISULFATE 300 MG PO TABS
ORAL_TABLET | ORAL | Status: AC
  Filled 2018-10-15: qty 2

## 2018-10-15 MED ORDER — LABETALOL HCL 5 MG/ML IV SOLN
10.0000 mg | INTRAVENOUS | Status: DC | PRN
  Administered 2018-10-15 (×2): 10 mg via INTRAVENOUS
  Filled 2018-10-15: qty 4

## 2018-10-15 MED ORDER — HEPARIN (PORCINE) IN NACL 1000-0.9 UT/500ML-% IV SOLN
INTRAVENOUS | Status: AC
  Filled 2018-10-15: qty 500

## 2018-10-15 MED ORDER — FENTANYL CITRATE (PF) 100 MCG/2ML IJ SOLN
INTRAMUSCULAR | Status: DC | PRN
  Administered 2018-10-15 (×3): 50 ug via INTRAVENOUS

## 2018-10-15 MED ORDER — FENTANYL CITRATE (PF) 100 MCG/2ML IJ SOLN
INTRAMUSCULAR | Status: AC
  Filled 2018-10-15: qty 2

## 2018-10-15 MED ORDER — MIDAZOLAM HCL 2 MG/2ML IJ SOLN
INTRAMUSCULAR | Status: AC
  Filled 2018-10-15: qty 2

## 2018-10-15 MED ORDER — SODIUM CHLORIDE 0.9% FLUSH: 3.0000 mL | INTRAVENOUS | Status: DC | PRN

## 2018-10-15 MED ORDER — CLOPIDOGREL BISULFATE 75 MG PO TABS: 75.0000 mg | ORAL_TABLET | Freq: Every day | ORAL | 11 refills | Status: DC

## 2018-10-15 MED ORDER — ACETAMINOPHEN 325 MG PO TABS: 650.0000 mg | ORAL_TABLET | ORAL | Status: DC | PRN

## 2018-10-15 MED ORDER — SODIUM CHLORIDE 0.9 % IV SOLN: 250.0000 mL | INTRAVENOUS | Status: DC | PRN

## 2018-10-15 MED ORDER — LIDOCAINE HCL (PF) 1 % IJ SOLN
INTRAMUSCULAR | Status: DC | PRN
  Administered 2018-10-15: 20 mL

## 2018-10-15 MED ORDER — CLOPIDOGREL BISULFATE 300 MG PO TABS
ORAL_TABLET | ORAL | Status: DC | PRN
  Administered 2018-10-15: 300 mg via ORAL

## 2018-10-15 MED ORDER — SODIUM CHLORIDE 0.9 % IV SOLN
INTRAVENOUS | Status: DC
  Administered 2018-10-15: 09:00:00 via INTRAVENOUS

## 2018-10-15 MED ORDER — SODIUM CHLORIDE 0.9% FLUSH: 3.0000 mL | Freq: Two times a day (BID) | INTRAVENOUS | Status: DC

## 2018-10-15 MED ORDER — SODIUM CHLORIDE 0.9 % IV SOLN: INTRAVENOUS | Status: DC

## 2018-10-15 MED ORDER — IODIXANOL 320 MG/ML IV SOLN
INTRAVENOUS | Status: DC | PRN
  Administered 2018-10-15: 145 mL via INTRA_ARTERIAL

## 2018-10-15 MED ORDER — LABETALOL HCL 5 MG/ML IV SOLN
INTRAVENOUS | Status: DC | PRN
  Administered 2018-10-15 (×2): 10 mg via INTRAVENOUS

## 2018-10-15 MED ORDER — HEPARIN SODIUM (PORCINE) 1000 UNIT/ML IJ SOLN
INTRAMUSCULAR | Status: DC | PRN
  Administered 2018-10-15: 8000 [IU] via INTRAVENOUS

## 2018-10-15 MED ORDER — LIDOCAINE HCL (PF) 1 % IJ SOLN
INTRAMUSCULAR | Status: AC
  Filled 2018-10-15: qty 30

## 2018-10-15 MED ORDER — OXYCODONE HCL 5 MG PO TABS
5.0000 mg | ORAL_TABLET | ORAL | Status: DC | PRN
  Administered 2018-10-15 (×2): 10 mg via ORAL
  Filled 2018-10-15 (×2): qty 2

## 2018-10-15 MED ORDER — LABETALOL HCL 5 MG/ML IV SOLN
INTRAVENOUS | Status: AC
  Filled 2018-10-15: qty 4

## 2018-10-15 SURGICAL SUPPLY — 25 items
BALLN LUTONIX DCB 5X40X130 (BALLOONS) ×3
BALLN STERLING OTW 5X60X135 (BALLOONS) ×3
BALLOON LUTONIX DCB 5X40X130 (BALLOONS) IMPLANT
BALLOON STERLING OTW 5X60X135 (BALLOONS) IMPLANT
CATH ANGIO 5F PIGTAIL 65CM (CATHETERS) ×1 IMPLANT
CATH CROSS OVER TEMPO 5F (CATHETERS) ×1 IMPLANT
CATH QUICKCROSS .018X135CM (MICROCATHETER) ×1 IMPLANT
CATH STRAIGHT 5FR 65CM (CATHETERS) ×1 IMPLANT
CLOSURE MYNX CONTROL 6F/7F (Vascular Products) ×1 IMPLANT
KIT ENCORE 26 ADVANTAGE (KITS) ×1 IMPLANT
KIT MICROPUNCTURE NIT STIFF (SHEATH) ×1 IMPLANT
KIT PV (KITS) ×3 IMPLANT
SHEATH PINNACLE 5F 10CM (SHEATH) ×1 IMPLANT
SHEATH PINNACLE 6F 10CM (SHEATH) ×1 IMPLANT
SHEATH PINNACLE ST 6F 45CM (SHEATH) ×1 IMPLANT
SHEATH PROBE COVER 6X72 (BAG) ×1 IMPLANT
SHIELD RADPAD SCOOP 12X17 (MISCELLANEOUS) ×1 IMPLANT
STENT ELUVIA 6X60X130 (Permanent Stent) ×1 IMPLANT
SYR MEDRAD MARK V 150ML (SYRINGE) ×2 IMPLANT
SYRINGE MEDRAD AVANTA MACH 7 (SYRINGE) ×1 IMPLANT
TAPE VIPERTRACK RADIOPAQ (MISCELLANEOUS) IMPLANT
TAPE VIPERTRACK RADIOPAQUE (MISCELLANEOUS) ×3
TRANSDUCER W/STOPCOCK (MISCELLANEOUS) ×3 IMPLANT
TRAY PV CATH (CUSTOM PROCEDURE TRAY) ×3 IMPLANT
WIRE HITORQ VERSACORE ST 145CM (WIRE) ×1 IMPLANT

## 2018-10-15 NOTE — Telephone Encounter (Signed)
Post procedure LEA and ABI ordered. Message sent to scheduling for duplex and follow up appointment with Dr. Fletcher Anon.

## 2018-10-15 NOTE — Discharge Instructions (Signed)
Start taking clopidogrel (Plavix) 75 mg once daily on October 24.  A prescription was sent to your pharmacy.   Femoral Site Care Refer to this sheet in the next few weeks. These instructions provide you with information about caring for yourself after your procedure. Your health care provider may also give you more specific instructions. Your treatment has been planned according to current medical practices, but problems sometimes occur. Call your health care provider if you have any problems or questions after your procedure. What can I expect after the procedure? After your procedure, it is typical to have the following:  Bruising at the site that usually fades within 1-2 weeks.  Blood collecting in the tissue (hematoma) that may be painful to the touch. It should usually decrease in size and tenderness within 1-2 weeks.  Follow these instructions at home:  Take medicines only as directed by your health care provider.  You may shower 24-48 hours after the procedure or as directed by your health care provider. Remove the bandage (dressing) and gently wash the site with plain soap and water. Pat the area dry with a clean towel. Do not rub the site, because this may cause bleeding.  Do not take baths, swim, or use a hot tub until your health care provider approves.  Check your insertion site every day for redness, swelling, or drainage.  Do not apply powder or lotion to the site.  Limit use of stairs to twice a day for the first 2-3 days or as directed by your health care provider.  Do not squat for the first 2-3 days or as directed by your health care provider.  Do not lift over 10 lb (4.5 kg) for 5 days after your procedure or as directed by your health care provider.  Ask your health care provider when it is okay to: ? Return to work or school. ? Resume usual physical activities or sports. ? Resume sexual activity.  Do not drive home if you are discharged the same day as the  procedure. Have someone else drive you.  You may drive 24 hours after the procedure unless otherwise instructed by your health care provider.  Do not operate machinery or power tools for 24 hours after the procedure or as directed by your health care provider.  If your procedure was done as an outpatient procedure, which means that you went home the same day as your procedure, a responsible adult should be with you for the first 24 hours after you arrive home.  Keep all follow-up visits as directed by your health care provider. This is important. Contact a health care provider if:  You have a fever.  You have chills.  You have increased bleeding from the site. Hold pressure on the site. Get help right away if:  You have unusual pain at the site.  You have redness, warmth, or swelling at the site.  You have drainage (other than a small amount of blood on the dressing) from the site.  The site is bleeding, and the bleeding does not stop after 30 minutes of holding steady pressure on the site.  Your leg or foot becomes pale, cool, tingly, or numb. This information is not intended to replace advice given to you by your health care provider. Make sure you discuss any questions you have with your health care provider. Document Released: 08/13/2014 Document Revised: 05/17/2016 Document Reviewed: 06/29/2014 Elsevier Interactive Patient Education  [employer].

## 2018-10-15 NOTE — Interval H&P Note (Signed)
History and Physical Interval Note:  10/15/2018 11:33 AM  Erika [name]  has presented today for surgery, with the diagnosis of claudicaiton  The various methods of treatment have been discussed with the patient and family. After consideration of risks, benefits and other options for treatment, the patient has consented to  Procedure(s): ABDOMINAL AORTOGRAM W/LOWER EXTREMITY ([name]) as a surgical intervention .  The patient's history has been reviewed, patient examined, no change in status, stable for surgery.  I have reviewed the patient's chart and labs.  Questions were answered to the patient's satisfaction.     [name]

## 2018-10-16 ENCOUNTER — Encounter (HOSPITAL_COMMUNITY): Payer: Self-pay | Admitting: Cardiovascular Disease

## 2018-10-16 ENCOUNTER — Telehealth: Payer: Self-pay | Admitting: Cardiovascular Disease

## 2018-10-16 MED ORDER — ALIROCUMAB 75 MG/ML ~~LOC~~ SOPN: 1.0000 "pen " | PEN_INJECTOR | SUBCUTANEOUS | 11 refills | Status: DC

## 2018-10-16 NOTE — Telephone Encounter (Signed)
Please call regarding Praluent. Pt states her pharmacy is unable to get this rx, she states they are telling her she has to get it at a hospital pharmacy.

## 2018-10-16 NOTE — Telephone Encounter (Signed)
Pt is still having problems with her medication, states her insurance company is still giving her problems.

## 2018-10-16 NOTE — Telephone Encounter (Signed)
Called patient back. She is agreeable to having the prescription sent to [facility] knowing in the future we could [facility] it [facility] to [facility] if insurance preferred that.   She also mentioned having her lower extremity procedure yesterday. States she's having pain around where she thinks the stent was placed.  Describes it as a constant pain when sitting or laying down, "5" out of 10 pain level. When she stands up to walk, pain is throbbing and "7" out of 10 pain level.  The area she has pain is located at her left inner, upper thigh.  Denies redness, swelling, or warmth at the area. Skin and toes are normal color and sensation per patient.  She says the site in her inner groin is fine. Advised I will discuss with provider and call her back with further recommendations. She was appreciative.

## 2018-10-16 NOTE — Telephone Encounter (Signed)
S/w patient. She states that [facility] is unable to get [facility]uent from their vendor and [facility] does not have it either.  [facility] for [facility] information. Pharmacist, Jenny Reichmann, checked again and their vendor does not have it available and no [facility] within 20 miles has it currently.  Called [facility]. The pharmacist states he could get the Praluent from their vendor over the next day or so.

## 2018-10-16 NOTE — Telephone Encounter (Signed)
Messaged Dr Fletcher Anon who advised that some localized pain where the stent was placed is expected and should gradually ease. He said it would be fine for patient to apply a heat or cold compress if this helps her pain.  Called patient and she verbalized understanding of recommendations and will let us know if pain/symptoms worsen or do not ease.

## 2018-10-17 ENCOUNTER — Other Ambulatory Visit: Payer: Self-pay | Admitting: Cardiovascular Disease

## 2018-10-17 MED ORDER — ALIROCUMAB 75 MG/ML ~~LOC~~ SOPN: 1.0000 "pen " | PEN_INJECTOR | SUBCUTANEOUS | 11 refills | Status: DC

## 2018-10-17 MED FILL — Heparin Sod (Porcine)-NaCl IV Soln 1000 Unit/500ML-0.9%: INTRAVENOUS | Qty: 1000 | Status: AC

## 2018-10-17 NOTE — Telephone Encounter (Signed)
Patient calling back. Says she cannot get Praluent at [place] and must use [facility] instead.  States [facility] told her to have [name] fax the demographics and send in the prescription to them. Prior Authorization will probable need to be done as well. Advised her that I will re-send prescription to them and demographics. Let her know this process may take several days as they will let us know about the prior authorization and then we will complete that. She was appreciative.   Called [facility] after sending in prescirption. They have not processed it yet but gave me the fax number [phone]) to fax demographics and patient's med list too. He said this would help the process. It may take 24-48 hours to process the prescription and until prior authorization is initiated.  Demographics and med list faxed to [phone].

## 2018-10-17 NOTE — Addendum Note (Signed)
Addended by: Vanessa Ralphs on: 10/17/2018 01:13 PM   Modules accepted: Orders

## 2018-10-20 ENCOUNTER — Telehealth: Payer: Self-pay | Admitting: *Deleted

## 2018-10-20 NOTE — Telephone Encounter (Signed)
Post procedure appointment made for 11/07/18 with Dr. Fletcher Anon. Duplex schedule for 10/30/18. Patient has verbalized her understanding.

## 2018-10-22 ENCOUNTER — Encounter: Payer: Self-pay | Admitting: Neurology

## 2018-10-22 ENCOUNTER — Other Ambulatory Visit: Payer: Self-pay

## 2018-10-22 ENCOUNTER — Ambulatory Visit: Payer: BLUE CROSS/BLUE SHIELD | Admitting: Neurology

## 2018-10-22 ENCOUNTER — Telehealth: Payer: Self-pay | Admitting: Neurology

## 2018-10-22 VITALS — BP 186/103 | HR 95 | Ht 67.0 in | Wt 214.5 lb

## 2018-10-22 DIAGNOSIS — G44021 Chronic cluster headache, intractable: Secondary | ICD-10-CM

## 2018-10-22 DIAGNOSIS — M791 Myalgia, unspecified site: Secondary | ICD-10-CM

## 2018-10-22 DIAGNOSIS — I6529 Occlusion and stenosis of unspecified carotid artery: Secondary | ICD-10-CM

## 2018-10-22 DIAGNOSIS — G4700 Insomnia, unspecified: Secondary | ICD-10-CM

## 2018-10-22 DIAGNOSIS — E1169 Type 2 diabetes mellitus with other specified complication: Secondary | ICD-10-CM | POA: Diagnosis not present

## 2018-10-22 MED ORDER — GALCANEZUMAB-GNLM 120 MG/ML ~~LOC~~ SOAJ: 1.0000 "pen " | SUBCUTANEOUS | 4 refills | Status: DC

## 2018-10-22 NOTE — Telephone Encounter (Signed)
[employer] [place] auth: [employer] spoke to Domenick Bookbinder Ref # [id number] order sent to GI. GI has been trying to reach out to the pt.

## 2018-10-22 NOTE — Progress Notes (Signed)
[facility]  PATIENT: Erika [name] DOB: August 04, 1971  REFERRING DOCTOR OR PCP:  Wilfred Lacy SOURCE: Patient, notes from primary care, imaging and lab reports, CT and MRI scan images personally reviewed  _________________________________   HISTORICAL  CHIEF COMPLAINT:  Chief Complaint  Patient presents with  . New Patient (Initial Visit)    RM 12, alone. Internal referral from Wilfred Lacy, NP at [facility] for myalgia.  . Myalgia    Originally, she was sent to our office for pain in legs with ambulation. Was unable to walk 20-30 ft without pain. They r/o any neurological reason. Found to be caused by claudication. Had stent placed in left leg on 10-15-18. She will also have stent placed in right leg  . Headache    She has had headaches since 2016. She is getting daily cluster headaches she believes. She has home O2 to use prn. She takes norco prn for pain but helps only a little. This is what she would like to address today at her visit.     HISTORY OF PRESENT ILLNESS:  I had the pleasure seeing a patient, Erika [name], at [facility] for neurologic consultation regarding her headaches.     She is a 47 y.o. woman with daily headaches that started 4 or 5 years ago.  At that time, she began to experience daily headaches that seem to start near the right ear and radiates to the entire right side of her head. She experiences headache 30/30 days for > 4 hours every day.  Initially, she felt the HA's were due to hypertension but they persisted.   She has constant pain that will intensify at times to 10/10 pain with a more shooting quality.     She has been on Norco 7.5 with some benefit --- down to 7/10.     She does not have nausea or vomiting.   She had more pain in bright lights.    She gets some benefit from oxygen therapy and laying down in a dark room.      She is on many medications and has preferred not be on more daily  medications.   She has been on oxygen with short term benefit and has been on lamotrigine without benefit for the headache.     She was experiencing a lot of pain in the legs with claudication.  She was found to have severe peripheral vascular disease and has undergone stenting on the left with improvement.  She has multiple medical conditions and has had severe vascular issues.  Specifically, she has coronary artery disease and had an myocardial infarction requiring CABG.  She has carotid stenosis (she had a string sign on the right and underwent urgent carotid endarterectomy) and peripheral vascular disease (she had a stent placed in the right femoral artery recently and may undergo a procedure on the left.  She also has hypertension with a history of malignant hypertension.  She has insulin-dependent type 2 diabetes.  MRI of the brain dated 12/03/2012.  She has mild chronic microvascular ischemic changes with no acute findings.   I also reviewed the angiogram from 06/30/2016 which showed severe right ICA stenosis and occluded left vertebral artery   REVIEW OF SYSTEMS: Constitutional: No fevers, chills, sweats, or change in appetite.   She has headaches and insomnia. Eyes: No visual changes, double vision, eye pain Ear, nose and throat: No hearing loss, ear pain, nasal congestion, sore throat Cardiovascular: No chest pain, palpitations.  She has a history of MI and CABG Respiratory: No shortness of breath at rest or with exertion.   No wheezes GastrointestinaI: No nausea, vomiting, diarrhea, abdominal pain, fecal incontinence Genitourinary: No dysuria, urinary retention or frequency.  No nocturia. Musculoskeletal:She has pain in her legs. Integumentary: No rash, pruritus, skin lesions Neurological: as above Psychiatric: She has depression and anxiety Endocrine: She has insulin-dependent diabetes mellitus  hematologic/Lymphatic: No anemia, purpura, petechiae. Allergic/Immunologic: No  itchy/runny eyes, nasal congestion, recent allergic reactions, rashes  ALLERGIES: No Known Allergies  HOME MEDICATIONS:  Current Outpatient Medications:  .  albuterol (PROVENTIL HFA;VENTOLIN HFA) 108 (90 Base) MCG/ACT inhaler, Inhale 2 puffs into the lungs every 6 (six) hours as needed for wheezing., Disp: 1 Inhaler, Rfl: 0 .  Alirocumab (PRALUENT) 75 MG/ML SOPN, Inject 1 pen into the skin every 14 (fourteen) days., Disp: 2 pen, Rfl: 11 .  aspirin EC 81 MG tablet, Take 1 tablet (81 mg total) by mouth daily., Disp: 90 tablet, Rfl: 3 .  benztropine (COGENTIN) 0.5 MG tablet, Take 1 tablet (0.5 mg total) by mouth daily., Disp: 90 tablet, Rfl: 1 .  carvedilol (COREG) 25 MG tablet, Take 1 tablet (25 mg total) by mouth 2 (two) times daily with a meal., Disp: 180 tablet, Rfl: 0 .  chlorproMAZINE (THORAZINE) 25 MG tablet, Take 1 tablet (25 mg total) by mouth at bedtime., Disp: 90 tablet, Rfl: 0 .  clonazePAM (KLONOPIN) 0.5 MG tablet, Take one tab three times a day and 4th as needed (Patient taking differently: Take 0.5 mg by mouth 4 (four) times daily as needed for anxiety. Take one tab three times a day and 4th as needed), Disp: 100 tablet, Rfl: 1 .  clopidogrel (PLAVIX) 75 MG tablet, Take 1 tablet (75 mg total) by mouth daily., Disp: 30 tablet, Rfl: 11 .  cyclobenzaprine (FEXMID) 7.5 MG tablet, TAKE 1 TABLET BY MOUTH 3 TIMES A DAY AS NEEDED FOR MUSCLE SPASMS (Patient taking differently: Take 7.5 mg by mouth 3 (three) times daily as needed for muscle spasms. ), Disp: 60 tablet, Rfl: 2 .  Diclofenac Sodium (PENNSAID) 2 % SOLN, Place 1 application onto the skin 2 (two) times daily. (Patient taking differently: Place 1 application onto the skin daily as needed (pain). ), Disp: 1 Bottle, Rfl: 3 .  FLUoxetine HCl 60 MG TABS, Take 60 mg by mouth daily., Disp: 90 tablet, Rfl: 0 .  HYDROcodone-acetaminophen (NORCO) 7.5-325 MG tablet, Take 1 tablet by mouth every 6 (six) hours as needed for moderate pain., Disp:  60 tablet, Rfl: 0 .  insulin glargine (LANTUS) 100 UNIT/ML injection, Inject 0.3 mLs (30 Units total) into the skin at bedtime. (Patient taking differently: Inject 35 Units into the skin at bedtime. ), Disp: 10 mL, Rfl: 2 .  Insulin Syringe-Needle U-100 29G X 1/2" 0.3 ML MISC, 1 each by Does not apply route 2 (two) times daily., Disp: 30 each, Rfl: 0 .  isosorbide mononitrate (IMDUR) 30 MG 24 hr tablet, TAKE 1 TABLET (30 MG TOTAL) BY MOUTH DAILY. PLEASE KEEP APPOINTMENT FOR FURTHER REFILLS! THANKS :), Disp: 90 tablet, Rfl: 3 .  lamoTRIgine (LAMICTAL) 200 MG tablet, Take 1 tablet (200 mg total) by mouth daily., Disp: 90 tablet, Rfl: 0 .  losartan (COZAAR) 50 MG tablet, Take 1 tablet (50 mg total) by mouth daily., Disp: 90 tablet, Rfl: 0 .  nitroGLYCERIN (NITROSTAT) 0.4 MG SL tablet, PLACE 1 TABLET (0.4 MG TOTAL) UNDER THE TONGUE EVERY 5 (FIVE) MINUTES AS NEEDED FOR CHEST  PAIN., Disp: 25 tablet, Rfl: 0 .  NOVOLIN 70/30 RELION (70-30) 100 UNIT/ML injection, INJECT 5 UNITS SUBCUTANEOUSLY TWICE DAILY (Patient taking differently: Inject 7-25 Units into the skin 3 (three) times daily with meals. ), Disp: 10 mL, Rfl: 2 .  ondansetron (ZOFRAN) 4 MG tablet, Take 1 tablet (4 mg total) by mouth daily as needed., Disp: 10 tablet, Rfl: 2 .  rosuvastatin (CRESTOR) 10 MG tablet, Take 1 tablet (10 mg total) by mouth daily., Disp: 90 tablet, Rfl: 3 .  spironolactone (ALDACTONE) 25 MG tablet, Take 1 tablet (25 mg total) by mouth daily., Disp: 30 tablet, Rfl: 5 .  triamcinolone cream (KENALOG) 0.1 %, Apply 1 application topically 2 (two) times daily. (Patient taking differently: Apply 1 application topically daily as needed (irritation). ), Disp: 30 g, Rfl: 0 .  Galcanezumab-gnlm (EMGALITY) 120 MG/ML SOAJ, Inject 1 pen into the skin every 28 (twenty-eight) days., Disp: 3 pen, Rfl: 4  PAST MEDICAL HISTORY: Past Medical History:  Diagnosis Date  . Arthralgia of temporomandibular joint   . CAD, multiple vessel    a.  cath 06/29/16: ostLM 40%, ostLAD 40%, pLAD 95%, ost-pLCx 60%, pLCx 95%, mLCx 60%, mRCA 95%, D2 50%, LVSF nl;  b. 07/2016 CABG x 4 (LIMA->LAD, VG->Diag, VG->OM, VG->RCA); c. 08/2016 Cath: 3VD w/ 4/4 patent grafts. LAD distal to LIMA has diff dzs->Med rx.  . Carotid arterial disease ([place])    a. 07/2016 s/p R CEA.  . Depression   . Diastolic dysfunction    a. echo 06/28/16: EF 50-55%, mild inf wall HK, GR1DD, mild MR, RV sys fxn nl, mildly dilated LA, PASP nl  . Fatty liver disease, nonalcoholic 3532  . HLD (hyperlipidemia)   . Labile hypertension    a. prior renal ngiogram negative for RAS in 03/2016; b. catecholamines and metanephrines normal, mildly elevated renin with normal aldosterone and normal ratio in 02/2016  . Obesity   . PTSD (post-traumatic stress disorder)   . Tobacco abuse    a. 2018 - cut back from 2 ppd to 0.5 ppd.  . Type 2 diabetes mellitus [facility]) January 2017    PAST SURGICAL HISTORY: Past Surgical History:  Procedure Laterality Date  . ABDOMINAL AORTOGRAM W/LOWER EXTREMITY [name] 10/15/2018   Procedure: ABDOMINAL AORTOGRAM W/LOWER EXTREMITY;  Surgeon: Wellington Hampshire, MD;  Location: [place] [facility];  Service: Cardiovascular;  Laterality: [name];  . CARDIAC CATHETERIZATION [name] 06/29/2016   Procedure: Left Heart Cath and Coronary Angiography;  Surgeon: Minna Merritts, MD;  Location: [place] [facility];  Service: Cardiovascular;  Laterality: [name];  . CARDIAC CATHETERIZATION [name] 08/29/2016   Procedure: Left Heart Cath and Cors/Grafts Angiography;  Surgeon: Wellington Hampshire, MD;  Location: [place] [facility];  Service: Cardiovascular;  Laterality: [name];  . CESAREAN SECTION    . CHOLECYSTECTOMY    . CORONARY ARTERY BYPASS GRAFT [name] 07/06/2016   Procedure: CORONARY ARTERY BYPASS GRAFTING (CABG) x four, using left internal mammary artery and right leg greater saphenous vein harvested endoscopically;  Surgeon: Ivin Poot, MD;  Location: [place];  Service: Open Heart Surgery;  Laterality:  [name];  . ENDARTERECTOMY Right 07/06/2016   Procedure: ENDARTERECTOMY CAROTID;  Surgeon: Rosetta Posner, MD;  Location: [place];  Service: Vascular;  Laterality: Right;  . PERIPHERAL VASCULAR CATHETERIZATION [name] 04/18/2016   Procedure: Renal Angiography;  Surgeon: Wellington Hampshire, MD;  Location: [place] [facility];  Service: Cardiovascular;  Laterality: [name];  . PERIPHERAL VASCULAR INTERVENTION Left 10/15/2018  Procedure: PERIPHERAL VASCULAR INTERVENTION;  Surgeon: Wellington Hampshire, MD;  Location: [place] [facility];  Service: Cardiovascular;  Laterality: Left;  Left superficial femoral  . TEE WITHOUT CARDIOVERSION [name] 07/06/2016   Procedure: TRANSESOPHAGEAL ECHOCARDIOGRAM (TEE);  Surgeon: Ivin Poot, MD;  Location: [place];  Service: Open Heart Surgery;  Laterality: [name];  . TONSILLECTOMY      FAMILY HISTORY: Family History  Adopted: Yes  Problem Relation Age of Onset  . Diabetes Mother   . Diabetes Father   . Alcohol abuse Father   . Heart disease Father   . Drug abuse Father   . Stroke Sister   . Anxiety disorder Sister     SOCIAL HISTORY:  Social History   Socioeconomic History  . Marital status: Married    Spouse name: Not on file  . Number of children: 1  . Years of education: 63  . Highest education level: Not on file  Occupational History  . Occupation: Disability  Social Needs  . Financial resource strain: Not on file  . Food insecurity:    Worry: Not on file    Inability: Not on file  . Transportation needs:    Medical: Not on file    Non-medical: Not on file  Tobacco Use  . Smoking status: Current Every Day Smoker    Packs/day: 1.00    Types: Cigarettes    Last attempt to quit: 05/23/2017    Years since quitting: 1.4  . Smokeless tobacco: Never Used  . Tobacco comment: patient smokes when she drives.   Substance and Sexual Activity  . Alcohol use: Yes    Alcohol/week: 0.0 standard drinks    Comment: socially  . Drug use: No  . Sexual activity: Yes     Partners: Male    Birth control/protection: None  Lifestyle  . Physical activity:    Days per week: Not on file    Minutes per session: Not on file  . Stress: Not on file  Relationships  . Social connections:    Talks on phone: Not on file    Gets together: Not on file    Attends religious service: Not on file    Active member of club or organization: Not on file    Attends meetings of clubs or organizations: Not on file    Relationship status: Not on file  . Intimate partner violence:    Fear of current or ex partner: Not on file    Emotionally abused: Not on file    Physically abused: Not on file    Forced sexual activity: Not on file  Other Topics Concern  . Not on file  Social History Narrative   Lives with husband   Caffeine use: Drinks 2 cups coffee per day   Right handed     PHYSICAL EXAM  Vitals:   10/22/18 1018  BP: (!) 186/103  Pulse: 95  Weight: 214 lb 8 oz (97.3 kg)  Height: 5\' 7"  (1.702 m)    Body mass index is 33.6 kg/m.   General: The patient is well-developed and well-nourished and in no acute distress  Eyes:  Funduscopic exam shows normal optic discs and retinal vessels.  Neck: The neck is supple, no carotid bruits are noted.  She has a right carotid endarterectomy scar.  Range of motion is normal in the neck.  Cardiovascular: The heart has a regular rate and rhythm with a normal S1 and S2. There were no murmurs, gallops or rubs. Lungs  are clear to auscultation.   She has a CABG scar  Skin: Extremities are without rash or edema.   Neurologic Exam  Mental status: The patient is alert and oriented x 3 at the time of the examination. The patient has apparent normal recent and remote memory, with an apparently normal attention span and concentration ability.   Speech is normal.  Cranial nerves: Extraocular movements are full. Pupils are equal, round, and reactive to light and accomodation.  Visual fields are full.  Facial symmetry is present.  There is good facial sensation to soft touch bilaterally.Facial strength is normal.  Trapezius and sternocleidomastoid strength is normal. No dysarthria is noted.  The tongue is midline, and the patient has symmetric elevation of the soft palate. No obvious hearing deficits are noted.  Motor:  Muscle bulk is normal.   Tone is normal. Strength is  5 / 5 in all 4 extremities.   Sensory: Sensory testing is intact to pinprick, soft touch and vibration sensation in all 4 extremities.   She has normal vibration sensation at the ankles and mildly reduced at the toes  Coordination: Cerebellar testing reveals good finger-nose-finger and heel-to-shin bilaterally.  Gait and station: Station is normal.   Gait is normal. Tandem gait is mildly wide. Romberg is negative.   Reflexes: Deep tendon reflexes are symmetric and 2-3 at the knees and trace at the ankles.  The plantar responses are flexor.    DIAGNOSTIC DATA (LABS, IMAGING, TESTING) - I reviewed patient records, labs, notes, testing and imaging myself where available.  Lab Results  Component Value Date   WBC 10.1 10/10/2018   HGB 15.5 (H) 10/10/2018   HCT 46.1 (H) 10/10/2018   MCV 88.1 10/10/2018   PLT 361 10/10/2018      Component Value Date/Time   NA 133 (L) 10/10/2018 0926   NA 136 06/11/2017 0811   NA 135 (L) 01/11/2015 1022   K 4.0 10/10/2018 0926   K 3.9 01/11/2015 1022   CL 96 (L) 10/10/2018 0926   CL 100 01/11/2015 1022   CO2 27 10/10/2018 0926   CO2 27 01/11/2015 1022   GLUCOSE 266 (H) 10/10/2018 0926   GLUCOSE 183 (H) 01/11/2015 1022   BUN 9 10/10/2018 0926   BUN 11 06/11/2017 0811   BUN 6 (L) 01/11/2015 1022   CREATININE 0.74 10/10/2018 0926   CREATININE 0.77 01/11/2015 1022   CALCIUM 9.6 10/10/2018 0926   CALCIUM 10.0 01/11/2015 1022   PROT 7.2 10/10/2018 0926   PROT 7.1 08/21/2016 0817   PROT 7.8 01/11/2015 1022   ALBUMIN 3.7 10/10/2018 0926   ALBUMIN 4.3 08/21/2016 0817   ALBUMIN 3.4 01/11/2015 1022   AST 35  10/10/2018 0926   AST 70 (H) 01/11/2015 1022   ALT 67 (H) 10/10/2018 0926   ALT 85 (H) 01/11/2015 1022   ALKPHOS 90 10/10/2018 0926   ALKPHOS 91 01/11/2015 1022   BILITOT 0.3 10/10/2018 0926   BILITOT 0.3 08/21/2016 0817   BILITOT 0.4 01/11/2015 1022   GFRNONAA >60 10/10/2018 0926   GFRNONAA >60 01/11/2015 1022   GFRNONAA 41 (L) 08/10/2014 1459   GFRAA >60 10/10/2018 0926   GFRAA >60 01/11/2015 1022   GFRAA 48 (L) 08/10/2014 1459   Lab Results  Component Value Date   CHOL 244 (H) 10/10/2018   HDL 26 (L) 10/10/2018   LDLCALC UNABLE TO CALCULATE IF TRIGLYCERIDE OVER 400 mg/dL 10/10/2018   LDLDIRECT 155.0 09/24/2018   TRIG 473 (H) 10/10/2018   CHOLHDL  9.4 10/10/2018   Lab Results  Component Value Date   HGBA1C 10.4 (H) 06/24/2018   Lab Results  Component Value Date   VITAMINB12 242 04/02/2013   Lab Results  Component Value Date   TSH 1.68 02/25/2018       ASSESSMENT AND PLAN  Stenosis of carotid artery, unspecified laterality  Intractable chronic cluster headache - Plan: CT HEAD WO CONTRAST  Type 2 diabetes mellitus with other specified complication, without long-term current use of insulin (HCC)  Insomnia, unspecified type  Myalgia   In summary, Erika [name] is a 47 year old woman with intractable chronic daily headache.  There characteristics are unusual with some features of migraine and some features of cluster.  Additionally, she has a lot of tenderness at the right occiput and there could be a tension component.  I did a trigger point injection of the right splenius capitis muscle with 80 mg Depo-Medrol and 3 cc Marcaine.  Several minutes later her pain was noticeably improved.  We discussed that she might get a long-term benefit.  If she does not get a long-term benefit, I will start her on Emgality for the chronic headache.  We also will check a noncontrasted CT scan due to her many vascular risks and the characteristics of the headaches to rule out  structural etiology.  She will return to see me in 3 months or sooner if there are new or worsening neurologic symptoms.  However, she needs to call us next week if the headaches are not better.  If no improvement I will consider adding a prophylactic agent such as imipramine.   Kyleigh Nannini A. Felecia Shelling, MD, [facility] 87/57/9728, 2:06 PM Certified in Neurology, Clinical Neurophysiology, Sleep Medicine, Pain Medicine and Neuroimaging  [facility] [address], [place], [place] [address] [phone]

## 2018-10-23 ENCOUNTER — Telehealth: Payer: Self-pay | Admitting: Cardiovascular Disease

## 2018-10-23 ENCOUNTER — Telehealth: Payer: Self-pay | Admitting: *Deleted

## 2018-10-23 NOTE — Telephone Encounter (Signed)
Initiated PA emgality on covermymeds. Key: [id number]. In process of completing.

## 2018-10-23 NOTE — Telephone Encounter (Signed)
Please call to verify if PA form was received for Praluent 75 mg

## 2018-10-23 NOTE — Telephone Encounter (Signed)
PA submitted. Waiting on determination.  

## 2018-10-24 NOTE — Telephone Encounter (Signed)
Returned the call to [employer]. PA will be started for the patient's Praluent.

## 2018-10-27 ENCOUNTER — Telehealth: Payer: Self-pay | Admitting: *Deleted

## 2018-10-27 NOTE — Telephone Encounter (Signed)
Prior authorization sent in for Praluent. Key: [id number]

## 2018-10-28 ENCOUNTER — Encounter: Payer: Self-pay | Admitting: Family Medicine

## 2018-10-28 ENCOUNTER — Other Ambulatory Visit: Payer: Self-pay | Admitting: Family Medicine

## 2018-10-28 MED ORDER — MAGIC MOUTHWASH: 15.0000 mL | Freq: Three times a day (TID) | ORAL | 1 refills | Status: DC | PRN

## 2018-10-28 MED ORDER — INSULIN GLARGINE 100 UNIT/ML ~~LOC~~ SOLN: 35.0000 [IU] | Freq: Every day | SUBCUTANEOUS | 3 refills | Status: DC

## 2018-10-29 NOTE — Telephone Encounter (Signed)
Faxed appeal letter at [phone]. Received fax confirmation. Waiting on determination.

## 2018-10-29 NOTE — Telephone Encounter (Signed)
PA denied. Appeal letter written, waiting on MD signature.

## 2018-10-30 ENCOUNTER — Ambulatory Visit (INDEPENDENT_AMBULATORY_CARE_PROVIDER_SITE_OTHER): Payer: BLUE CROSS/BLUE SHIELD

## 2018-10-30 DIAGNOSIS — I739 Peripheral vascular disease, unspecified: Secondary | ICD-10-CM | POA: Diagnosis not present

## 2018-11-03 ENCOUNTER — Ambulatory Visit
Admission: RE | Admit: 2018-11-03 | Discharge: 2018-11-03 | Disposition: A | Discharge status: Home / Self Care | Payer: BLUE CROSS/BLUE SHIELD | Source: Ambulatory Visit | Attending: Neurology | Admitting: Neurology

## 2018-11-03 DIAGNOSIS — G44021 Chronic cluster headache, intractable: Secondary | ICD-10-CM

## 2018-11-03 NOTE — Telephone Encounter (Signed)
Received fax notification from [facility] that appeal for emgality approved from 10/31/18-01/31/19. Case ID#: [id number].  Faxed notice of approval to [facility] at [phone]. Received fax confirmation.

## 2018-11-05 NOTE — Telephone Encounter (Signed)
Call placed to [place] to inquire as to why the PA for Praluent was cancelled. They stated to resubmit the PA from their website. PA will be resubmitted and patient has been made aware.

## 2018-11-05 NOTE — Telephone Encounter (Signed)
Prior authorization had been cancelled. Call placed to [facility]. Hold time was 30 minutes. Will try back. Patient made aware.

## 2018-11-06 ENCOUNTER — Other Ambulatory Visit: Payer: Self-pay | Admitting: Cardiovascular Disease

## 2018-11-06 ENCOUNTER — Ambulatory Visit: Payer: Self-pay | Admitting: Nurse Practitioner

## 2018-11-07 ENCOUNTER — Encounter: Payer: Self-pay | Admitting: Cardiovascular Disease

## 2018-11-07 ENCOUNTER — Ambulatory Visit: Payer: BLUE CROSS/BLUE SHIELD | Admitting: Cardiovascular Disease

## 2018-11-07 VITALS — BP 154/100 | HR 75 | Ht 67.0 in | Wt 213.5 lb

## 2018-11-07 DIAGNOSIS — I739 Peripheral vascular disease, unspecified: Secondary | ICD-10-CM

## 2018-11-07 DIAGNOSIS — I25118 Atherosclerotic heart disease of native coronary artery with other forms of angina pectoris: Secondary | ICD-10-CM | POA: Diagnosis not present

## 2018-11-07 DIAGNOSIS — E785 Hyperlipidemia, unspecified: Secondary | ICD-10-CM

## 2018-11-07 DIAGNOSIS — I10 Essential (primary) hypertension: Secondary | ICD-10-CM

## 2018-11-07 DIAGNOSIS — Z720 Tobacco use: Secondary | ICD-10-CM

## 2018-11-07 MED ORDER — LOSARTAN POTASSIUM 100 MG PO TABS: 100.0000 mg | ORAL_TABLET | Freq: Every day | ORAL | 1 refills | Status: DC

## 2018-11-07 NOTE — Progress Notes (Signed)
Cardiology Office Note   Date:  11/07/2018   ID:  Erika [name], DOB 1971-03-06, MRN [id number]  PCP:  Lucille Passy, MD Cardiologist:   [name], MD   Chief Complaint  Patient presents with  . other    Post procedure claudication c/o right leg pain. Meds reviewed verbally with pt.      History of Present Illness: Erika [name] is a 47 y.o. female who presents for a follow-up visit regarding coronary artery disease .[name] with no evidence of secondary hypertension. She presented in July, 2017 with non-ST elevation myocardial infarction. She underwent cardiac catheterization which showed severe three-vessel coronary artery disease.  Preop carotid Doppler showed 80-99% right ICA stenosis. She underwent CABG and right carotid endarterectomy at the same time in July of 2017.  She was rehospitalized in September, 2017 with chest pain in the setting of uncontrolled hypertension. Cardiac catheterization showed patent grafts . The LAD had diffuse disease distal to the anastomosis and was very small in caliber. Ejection fraction was normal with mildly elevated left ventricular end-diastolic pressure.   She has history of difficult to control hypertension with labile blood pressure.   Unfortunately, she continues to smoke.  She has known history of severe mixed hyperlipidemia .  She was seen recently for severe left calf claudication.  Noninvasive vascular studies showed mildly reduced ABI bilaterally.  Duplex showed short occlusion of the left mid SFA as well as severe stenosis affecting the right SFA.    I proceeded with angiography which showed significant proximal left SFA stenosis followed by short occlusion in the mid to distal segment and three-vessel runoff below the knee.  I performed successful drug-eluting stent placement to the mid and distal SFA drug-coated balloon angioplasty to the proximal SFA with excellent results.   On the right side, there was borderline significant mid SFA stenosis. She reports resolution of left calf claudication although she continues to have right calf claudication.  She takes her medications regularly.   Past Medical History:  Diagnosis Date  . Arthralgia of temporomandibular joint   . CAD, multiple vessel    a. cath 06/29/16: ostLM 40%, ostLAD 40%, pLAD 95%, ost-pLCx 60%, pLCx 95%, mLCx 60%, mRCA 95%, D2 50%, LVSF nl;  b. 07/2016 CABG x 4 (LIMA->LAD, VG->Diag, VG->OM, VG->RCA); c. 08/2016 Cath: 3VD w/ 4/4 patent grafts. LAD distal to LIMA has diff dzs->Med rx.  . Carotid arterial disease ([place])    a. 07/2016 s/p R CEA.  . Depression   . Diastolic dysfunction    a. echo 06/28/16: EF 50-55%, mild inf wall HK, GR1DD, mild MR, RV sys fxn nl, mildly dilated LA, PASP nl  . Fatty liver disease, nonalcoholic 0102  . HLD (hyperlipidemia)   . Labile hypertension    a. prior renal ngiogram negative for RAS in 03/2016; b. catecholamines and metanephrines normal, mildly elevated renin with normal aldosterone and normal ratio in 02/2016  . Obesity   . PTSD (post-traumatic stress disorder)   . Tobacco abuse    a. 2018 - cut back from 2 ppd to 0.5 ppd.  . Type 2 diabetes mellitus [facility]) January 2017    Past Surgical History:  Procedure Laterality Date  . ABDOMINAL AORTOGRAM W/LOWER EXTREMITY [name] 10/15/2018   Procedure: ABDOMINAL AORTOGRAM W/LOWER EXTREMITY;  Surgeon: Wellington Hampshire, MD;  Location: [place] [facility];  Service: Cardiovascular;  Laterality: [name];  . CARDIAC CATHETERIZATION [name] 06/29/2016   Procedure: Left  Heart Cath and Coronary Angiography;  Surgeon: Minna Merritts, MD;  Location: [name] [facility];  Service: Cardiovascular;  Laterality: [name];  . CARDIAC CATHETERIZATION [name] 08/29/2016   Procedure: Left Heart Cath and Cors/Grafts Angiography;  Surgeon: Wellington Hampshire, MD;  Location: [place] [facility];  Service: Cardiovascular;  Laterality: [name];  . CESAREAN SECTION    .  CHOLECYSTECTOMY    . CORONARY ARTERY BYPASS GRAFT [name] 07/06/2016   Procedure: CORONARY ARTERY BYPASS GRAFTING (CABG) x four, using left internal mammary artery and right leg greater saphenous vein harvested endoscopically;  Surgeon: Ivin Poot, MD;  Location: [place];  Service: Open Heart Surgery;  Laterality: [name];  . ENDARTERECTOMY Right 07/06/2016   Procedure: ENDARTERECTOMY CAROTID;  Surgeon: Rosetta Posner, MD;  Location: [place];  Service: Vascular;  Laterality: Right;  . PERIPHERAL VASCULAR CATHETERIZATION [name] 04/18/2016   Procedure: Renal Angiography;  Surgeon: Wellington Hampshire, MD;  Location: [place] [facility];  Service: Cardiovascular;  Laterality: [name];  . PERIPHERAL VASCULAR INTERVENTION Left 10/15/2018   Procedure: PERIPHERAL VASCULAR INTERVENTION;  Surgeon: Wellington Hampshire, MD;  Location: [place] [facility];  Service: Cardiovascular;  Laterality: Left;  Left superficial femoral  . TEE WITHOUT CARDIOVERSION [name] 07/06/2016   Procedure: TRANSESOPHAGEAL ECHOCARDIOGRAM (TEE);  Surgeon: Ivin Poot, MD;  Location: [place];  Service: Open Heart Surgery;  Laterality: [name];  . TONSILLECTOMY       Current Outpatient Medications  Medication Sig Dispense Refill  . albuterol (PROVENTIL HFA;VENTOLIN HFA) 108 (90 Base) MCG/ACT inhaler Inhale 2 puffs into the lungs every 6 (six) hours as needed for wheezing. 1 Inhaler 0  . aspirin EC 81 MG tablet Take 1 tablet (81 mg total) by mouth daily. 90 tablet 3  . benztropine (COGENTIN) 0.5 MG tablet Take 1 tablet (0.5 mg total) by mouth daily. 90 tablet 1  . carvedilol (COREG) 25 MG tablet Take 1 tablet (25 mg total) by mouth 2 (two) times daily with a meal. 180 tablet 0  . chlorproMAZINE (THORAZINE) 25 MG tablet Take 1 tablet (25 mg total) by mouth at bedtime. 90 tablet 0  . clonazePAM (KLONOPIN) 0.5 MG tablet Take one tab three times a day and 4th as needed (Patient taking differently: Take 0.5 mg by mouth 4 (four) times daily as needed for anxiety. Take  one tab three times a day and 4th as needed) 100 tablet 1  . clopidogrel (PLAVIX) 75 MG tablet Take 1 tablet (75 mg total) by mouth daily. 30 tablet 11  . cyclobenzaprine (FEXMID) 7.5 MG tablet TAKE 1 TABLET BY MOUTH 3 TIMES A DAY AS NEEDED FOR MUSCLE SPASMS (Patient taking differently: Take 7.5 mg by mouth 3 (three) times daily as needed for muscle spasms. ) 60 tablet 2  . Diclofenac Sodium (PENNSAID) 2 % SOLN Place 1 application onto the skin 2 (two) times daily. (Patient taking differently: Place 1 application onto the skin daily as needed (pain). ) 1 Bottle 3  . FLUoxetine HCl 60 MG TABS Take 60 mg by mouth daily. 90 tablet 0  . Galcanezumab-gnlm (EMGALITY) 120 MG/ML SOAJ Inject 1 pen into the skin every 28 (twenty-eight) days. 3 pen 4  . HYDROcodone-acetaminophen (NORCO) 7.5-325 MG tablet Take 1 tablet by mouth every 6 (six) hours as needed for moderate pain. 60 tablet 0  . insulin glargine (LANTUS) 100 UNIT/ML injection Inject 0.35 mLs (35 Units total) into the skin at bedtime. 3 mL 3  . Insulin Syringe-Needle U-100 29G  X 1/2" 0.3 ML MISC 1 each by Does not apply route 2 (two) times daily. 30 each 0  . isosorbide mononitrate (IMDUR) 30 MG 24 hr tablet TAKE 1 TABLET (30 MG TOTAL) BY MOUTH DAILY. PLEASE KEEP APPOINTMENT FOR FURTHER REFILLS! THANKS :) 90 tablet 3  . lamoTRIgine (LAMICTAL) 200 MG tablet Take 1 tablet (200 mg total) by mouth daily. 90 tablet 0  . losartan (COZAAR) 50 MG tablet TAKE 1 TABLET BY MOUTH EVERY DAY 30 tablet 3  . magic mouthwash SOLN Take 15 mLs by mouth 3 (three) times daily as needed for mouth pain. 250 mL 1  . nitroGLYCERIN (NITROSTAT) 0.4 MG SL tablet PLACE 1 TABLET (0.4 MG TOTAL) UNDER THE TONGUE EVERY 5 (FIVE) MINUTES AS NEEDED FOR CHEST PAIN. 25 tablet 0  . NOVOLIN 70/30 RELION (70-30) 100 UNIT/ML injection INJECT 5 UNITS SUBCUTANEOUSLY TWICE DAILY (Patient taking differently: Inject 7-25 Units into the skin 3 (three) times daily with meals. ) 10 mL 2  . ondansetron  (ZOFRAN) 4 MG tablet Take 1 tablet (4 mg total) by mouth daily as needed. 10 tablet 2  . rosuvastatin (CRESTOR) 10 MG tablet Take 1 tablet (10 mg total) by mouth daily. 90 tablet 3  . spironolactone (ALDACTONE) 25 MG tablet Take 1 tablet (25 mg total) by mouth daily. 30 tablet 5  . triamcinolone cream (KENALOG) 0.1 % Apply 1 application topically 2 (two) times daily. (Patient taking differently: Apply 1 application topically daily as needed (irritation). ) 30 g 0  . Alirocumab (PRALUENT) 75 MG/ML SOPN Inject 1 pen into the skin every 14 (fourteen) days. (Patient not taking: Reported on 11/07/2018) 2 pen 11   No current facility-administered medications for this visit.     Allergies:   Patient has no known allergies.    Social History:  The patient  reports that she has been smoking cigarettes. She has been smoking about 1.00 pack per day. She has never used smokeless tobacco. She reports that she drinks alcohol. She reports that she does not use drugs.   Family History:  The patient's family history includes Alcohol abuse in her father; Anxiety disorder in her sister; Diabetes in her father and mother; Drug abuse in her father; Heart disease in her father; Stroke in her sister. She was adopted.    ROS:  Please see the history of present illness.   Otherwise, review of systems are positive for none.   All other systems are reviewed and negative.    PHYSICAL EXAM: VS:  BP (!) 154/100 (BP Location: Left Arm, Patient Position: Sitting, Cuff Size: Normal)   Pulse 75   Ht 5\' 7"  (1.702 m)   Wt 213 lb 8 oz (96.8 kg)   BMI 33.44 kg/m  , BMI Body mass index is 33.44 kg/m. GEN: Well nourished, well developed, in no acute distress  HEENT: normal  Neck: no JVD, carotid bruits, or masses Cardiac: RRR; no  rubs, or gallops,no edema . There is 1/6 systolic flow murmur in the pulmonic area Respiratory:  clear to auscultation bilaterally, normal work of breathing GI: soft, nontender, nondistended, +  BS MS: no deformity or atrophy  Skin: warm and dry, no rash Neuro:  Strength and sensation are intact Psych: euthymic mood, full affect Vascular: Femoral pulses are slightly diminished bilaterally.  Distal pulses are palpable on the left side but not the right side.  No groin hematoma   EKG:  EKG is  ordered today. EKG showed normal sinus rhythm with  old septal infarct.  Inferolateral ST changes suggestive of ischemia.   Recent Labs: 02/25/2018: TSH 1.68 06/30/2018: B Natriuretic Peptide 21.0 10/10/2018: ALT 67; BUN 9; Creatinine, Ser 0.74; Hemoglobin 15.5; Platelets 361; Potassium 4.0; Sodium 133    Lipid Panel    Component Value Date/Time   CHOL 244 (H) 10/10/2018 0926   CHOL 121 08/21/2016 0817   TRIG 473 (H) 10/10/2018 0926   HDL 26 (L) 10/10/2018 0926   HDL 24 (L) 08/21/2016 0817   CHOLHDL 9.4 10/10/2018 0926   VLDL UNABLE TO CALCULATE IF TRIGLYCERIDE OVER 400 mg/dL 10/10/2018 0926   LDLCALC UNABLE TO CALCULATE IF TRIGLYCERIDE OVER 400 mg/dL 10/10/2018 0926   LDLCALC 64 08/21/2016 0817   LDLDIRECT 155.0 09/24/2018 0819      Wt Readings from Last 3 Encounters:  11/07/18 213 lb 8 oz (96.8 kg)  10/22/18 214 lb 8 oz (97.3 kg)  10/15/18 217 lb (98.4 kg)         ASSESSMENT AND PLAN:  1.  Peripheral arterial disease with severe bilateral calf claudication: Status post successful revascularization to the left SFA.  Postprocedure ABI was close to normal and duplex showed patent stent and angioplasty site.  Continue dual antiplatelet therapy.  Continue aggressive treatment of risk factors.  She has borderline significant right SFA stenosis.  Although she has claudication affecting the right calf, I recommend attempting medical therapy given that the stenosis is not as significant as it was on the left side.  Endovascular intervention can be considered if there is no improvement.  I instructed her to start a walking program.  2. Coronary artery disease involving native coronary  arteries with stable angina: Status post CABG in July.   Symptoms are controlled with long-acting nitroglycerin and a beta-blocker.  3. Carotid artery disease status post right carotid endarterectomy. Continue aggressive treatment of risk factors.She follows with [facility].  4. Essential hypertension: Blood pressure continues to be elevated but has improved.  Hydrochlorothiazide was discontinued due to hyponatremia.  I am going to increase losartan to 100 mg daily.  Recent angiography showed no evidence of renal artery stenosis.  5. Hyperlipidemia: Currently on rosuvastatin but most recent LDL was 155.  I started the patient on Praluent but this has not been approved by insurance yet.  6. Tobacco use: Smoking cessation advised    Disposition:   FU with me in 3 months  Signed,  [name], MD  11/07/2018 11:48 AM    [place]

## 2018-11-07 NOTE — Patient Instructions (Addendum)
Medication Instructions:  INCREASE the Losartan to 100 mg daily  If you need a refill on your cardiac medications before your next appointment, please call your pharmacy.   Lab work: None ordered  Testing/Procedures: None ordered  Follow-Up: At [facility], you and your health needs are our priority.  As part of our continuing mission to provide you with exceptional heart care, we have created designated Provider Care Teams.  These Care Teams include your primary Cardiologist (physician) and Advanced Practice Providers (APPs -  Physician Assistants and Nurse Practitioners) who all work together to provide you with the care you need, when you need it. You will need a follow up appointment in 3 months. You may see Dr. Fletcher Anon or one of the following Advanced Practice Providers on your designated Care Team:   Murray Hodgkins, NP Christell Faith, PA-C . Marrianne Mood, PA-C

## 2018-11-07 NOTE — Telephone Encounter (Signed)
Patient came in today for an office visit. She has been made aware that the PA was faxed to [facility].  She was also given samples of praluent:  Drug name: Praluent        Strength: 75mg /ml         Qty: 2 pens   LOT: [id number]   Exp.Date: 11/22/18

## 2018-11-08 ENCOUNTER — Other Ambulatory Visit (HOSPITAL_COMMUNITY): Payer: Self-pay | Admitting: Psychiatry

## 2018-11-08 ENCOUNTER — Other Ambulatory Visit: Payer: Self-pay | Admitting: Family Medicine

## 2018-11-08 DIAGNOSIS — F4310 Post-traumatic stress disorder, unspecified: Secondary | ICD-10-CM

## 2018-11-09 ENCOUNTER — Encounter: Payer: Self-pay | Admitting: Family Medicine

## 2018-11-10 ENCOUNTER — Other Ambulatory Visit: Payer: Self-pay

## 2018-11-10 MED ORDER — INSULIN GLARGINE 100 UNIT/ML ~~LOC~~ SOLN: 35.0000 [IU] | Freq: Every day | SUBCUTANEOUS | 3 refills | Status: DC

## 2018-11-10 MED ORDER — MAGIC MOUTHWASH: 15.0000 mL | Freq: Three times a day (TID) | ORAL | 1 refills | Status: DC | PRN

## 2018-11-10 NOTE — Telephone Encounter (Signed)
She had appointment on November 26.  She should have enough Klonopin until her next appointment.

## 2018-11-10 NOTE — Telephone Encounter (Signed)
Received fax. Praluent was approved from 11/10/2018 to 03/02/2019.  Will place approval letter in filing cabinet in med closet.

## 2018-11-10 NOTE — Telephone Encounter (Signed)
New dose sent in early this month to 35u qhs/asked pharm if they rec new Rx early this month/thx dmf

## 2018-11-11 ENCOUNTER — Other Ambulatory Visit: Payer: Self-pay

## 2018-11-11 MED ORDER — INSULIN NPH ISOPHANE & REGULAR (70-30) 100 UNIT/ML ~~LOC~~ SUSP: SUBCUTANEOUS | 2 refills | Status: DC

## 2018-11-12 DIAGNOSIS — M9901 Segmental and somatic dysfunction of cervical region: Secondary | ICD-10-CM | POA: Diagnosis not present

## 2018-11-12 DIAGNOSIS — M545 Low back pain: Secondary | ICD-10-CM | POA: Diagnosis not present

## 2018-11-12 DIAGNOSIS — M9903 Segmental and somatic dysfunction of lumbar region: Secondary | ICD-10-CM | POA: Diagnosis not present

## 2018-11-12 DIAGNOSIS — M542 Cervicalgia: Secondary | ICD-10-CM | POA: Diagnosis not present

## 2018-11-14 ENCOUNTER — Other Ambulatory Visit: Payer: Self-pay | Admitting: Family Medicine

## 2018-11-14 MED ORDER — HYDROCODONE-ACETAMINOPHEN 7.5-325 MG PO TABS: 1.0000 | ORAL_TABLET | Freq: Four times a day (QID) | ORAL | 0 refills | Status: DC | PRN

## 2018-11-18 ENCOUNTER — Ambulatory Visit (INDEPENDENT_AMBULATORY_CARE_PROVIDER_SITE_OTHER): Payer: BLUE CROSS/BLUE SHIELD | Admitting: Psychiatry

## 2018-11-18 ENCOUNTER — Encounter (HOSPITAL_COMMUNITY): Payer: Self-pay | Admitting: Psychiatry

## 2018-11-18 VITALS — BP 126/80 | HR 84 | Ht 67.0 in | Wt 207.0 lb

## 2018-11-18 DIAGNOSIS — F331 Major depressive disorder, recurrent, moderate: Secondary | ICD-10-CM | POA: Diagnosis not present

## 2018-11-18 DIAGNOSIS — F410 Panic disorder [episodic paroxysmal anxiety] without agoraphobia: Secondary | ICD-10-CM | POA: Diagnosis not present

## 2018-11-18 DIAGNOSIS — F4310 Post-traumatic stress disorder, unspecified: Secondary | ICD-10-CM

## 2018-11-18 MED ORDER — BENZTROPINE MESYLATE 0.5 MG PO TABS: 0.5000 mg | ORAL_TABLET | Freq: Every day | ORAL | 0 refills | Status: DC

## 2018-11-18 MED ORDER — LAMOTRIGINE 200 MG PO TABS: 200.0000 mg | ORAL_TABLET | Freq: Every day | ORAL | 0 refills | Status: DC

## 2018-11-18 MED ORDER — CHLORPROMAZINE HCL 25 MG PO TABS: 25.0000 mg | ORAL_TABLET | Freq: Every day | ORAL | 0 refills | Status: DC

## 2018-11-18 MED ORDER — CLONAZEPAM 0.5 MG PO TABS: ORAL_TABLET | ORAL | 1 refills | Status: DC

## 2018-11-18 MED ORDER — FLUOXETINE HCL 60 MG PO TABS: 60.0000 mg | ORAL_TABLET | Freq: Every day | ORAL | 0 refills | Status: DC
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
## 2018-11-24 ENCOUNTER — Other Ambulatory Visit: Payer: Self-pay | Admitting: Family Medicine

## 2018-12-01 ENCOUNTER — Telehealth: Payer: Self-pay | Admitting: *Deleted

## 2018-12-01 DIAGNOSIS — E785 Hyperlipidemia, unspecified: Secondary | ICD-10-CM

## 2018-12-01 NOTE — Telephone Encounter (Signed)
Call placed to [facility]. They have updated their records and now show an approved status for the patient for Praluent. They will call the patient and set up the first shipment. The patient has been made aware.

## 2018-12-01 NOTE — Telephone Encounter (Signed)
Call placed to the patient to check on the status of her Praluent. She stated that she has not gotten any delivered. Call placed tp [facility] to check on status.  Medication Samples have been provided to the patient.  Drug name: Praluent       Strength: 75mg         Qty: 2 pens  LOT: [id number]  Exp.Date: 09/2019

## 2018-12-03 ENCOUNTER — Other Ambulatory Visit: Payer: Self-pay | Admitting: Family Medicine

## 2018-12-03 ENCOUNTER — Other Ambulatory Visit: Payer: Self-pay | Admitting: Cardiovascular Disease

## 2018-12-06 ENCOUNTER — Other Ambulatory Visit: Payer: Self-pay | Admitting: Family Medicine

## 2018-12-08 NOTE — Telephone Encounter (Signed)
Patietn came to office to pick up praluent.  She says she needs orders to have labs for this drawn at [facility] .

## 2018-12-08 NOTE — Telephone Encounter (Signed)
Patient called and made aware that the orders have been placed for a fasting lipid and liver.

## 2018-12-08 NOTE — Addendum Note (Signed)
Addended by: Ricci Barker on: 12/08/2018 01:18 PM   Modules accepted: Orders

## 2018-12-19 DIAGNOSIS — M9901 Segmental and somatic dysfunction of cervical region: Secondary | ICD-10-CM | POA: Diagnosis not present

## 2018-12-19 DIAGNOSIS — M9903 Segmental and somatic dysfunction of lumbar region: Secondary | ICD-10-CM | POA: Diagnosis not present

## 2018-12-19 DIAGNOSIS — G44019 Episodic cluster headache, not intractable: Secondary | ICD-10-CM | POA: Diagnosis not present

## 2018-12-19 DIAGNOSIS — M545 Low back pain: Secondary | ICD-10-CM | POA: Diagnosis not present

## 2018-12-24 NOTE — Progress Notes (Signed)
Subjective:   Patient ID: Erika [name], female    DOB: [birth year], 48 y.o.   MRN: [id number]  Erika [name] is a pleasant 48 y.o. year old female who presents to clinic today with Follow-up (Patient is here today for a F/U.  Last OV was 10.02.2019 where CSC & UDS was updated.  [place] PMP checked and no red flags.  On 7.2.19 her A1C was 10.4.  )  on 12/25/2018  HPI:    DM- Taking lantus 35 units nightly and SSI insulin.  Has been having to use more SSI insulin lately. Under more stress with her daughter's health. Kidney function is worsening. Lab Results  Component Value Date   HGBA1C 9.9 (A) 12/25/2018   HGBA1C 9.9 12/25/2018   Cluster headaches- deteriorated.  She did see neurology.  MRI was neg. She has been having to take Norco. She is asking for perhaps pain management since pain isn't getting better.  HTN- well controlled today on current rxs.  Current Outpatient Medications on File Prior to Visit  Medication Sig Dispense Refill  . albuterol (PROVENTIL HFA;VENTOLIN HFA) 108 (90 Base) MCG/ACT inhaler Inhale 2 puffs into the lungs every 6 (six) hours as needed for wheezing. 1 Inhaler 0  . Erika [name] (PRALUENT) 75 MG/ML SOPN Inject 1 pen into the skin every 14 (fourteen) days. 2 pen 11  . aspirin EC 81 MG tablet Take 1 tablet (81 mg total) by mouth daily. 90 tablet 3  . benztropine (COGENTIN) 0.5 MG tablet Take 1 tablet (0.5 mg total) by mouth daily. 90 tablet 0  . carvedilol (COREG) 25 MG tablet TAKE 1 TABLET (25 MG TOTAL) BY MOUTH 2 (TWO) TIMES DAILY WITH A MEAL. 180 tablet 0  . chlorproMAZINE (THORAZINE) 25 MG tablet Take 1 tablet (25 mg total) by mouth at bedtime. 90 tablet 0  . clonazePAM (KLONOPIN) 0.5 MG tablet Take one tab three times a day and 4th as needed 100 tablet 1  . clopidogrel (PLAVIX) 75 MG tablet Take 1 tablet (75 mg total) by mouth daily. 30 tablet 11  . cyclobenzaprine (FEXMID) 7.5 MG tablet TAKE 1 TABLET BY MOUTH 3 TIMES A DAY AS NEEDED FOR MUSCLE  SPASMS 60 tablet 2  . Diclofenac Sodium (PENNSAID) 2 % SOLN Place 1 application onto the skin 2 (two) times daily. (Patient taking differently: Place 1 application onto the skin daily as needed (pain). ) 1 Bottle 3  . FLUoxetine HCl 60 MG TABS Take 60 mg by mouth daily. 90 tablet 0  . insulin NPH-regular Human (NOVOLIN 70/30 RELION) (70-30) 100 UNIT/ML injection Inject 5u SQ bid; but follow flow chart and may increase to 25u depending on flow chart up to tid 10 mL 2  . Insulin Syringe-Needle U-100 29G X 1/2" 0.3 ML MISC 1 each by Does not apply route 2 (two) times daily. 30 each 0  . isosorbide mononitrate (IMDUR) 30 MG 24 hr tablet TAKE 1 TABLET (30 MG TOTAL) BY MOUTH DAILY. PLEASE KEEP APPOINTMENT FOR FURTHER REFILLS! THANKS :) 90 tablet 3  . lamoTRIgine (LAMICTAL) 200 MG tablet Take 1 tablet (200 mg total) by mouth daily. 90 tablet 0  . losartan (COZAAR) 100 MG tablet Take 1 tablet (100 mg total) by mouth daily. 90 tablet 1  . magic mouthwash SOLN Take 15 mLs by mouth 3 (three) times daily as needed for mouth pain. 250 mL 1  . nitroGLYCERIN (NITROSTAT) 0.4 MG SL tablet PLACE 1 TABLET (0.4 MG TOTAL) UNDER THE TONGUE EVERY  5 (FIVE) MINUTES AS NEEDED FOR CHEST PAIN. 25 tablet 0  . ondansetron (ZOFRAN) 4 MG tablet TAKE 1 TABLET (4 MG TOTAL) BY MOUTH DAILY AS NEEDED. 10 tablet 2  . rosuvastatin (CRESTOR) 10 MG tablet Take 1 tablet (10 mg total) by mouth daily. 90 tablet 3  . spironolactone (ALDACTONE) 25 MG tablet Take 1 tablet (25 mg total) by mouth daily. 30 tablet 5  . triamcinolone cream (KENALOG) 0.1 % Apply 1 application topically 2 (two) times daily. 30 g 0  . Galcanezumab-gnlm (EMGALITY) 120 MG/ML SOAJ Inject 1 pen into the skin every 28 (twenty-eight) days. (Patient not taking: Reported on 12/25/2018) 3 pen 4   No current facility-administered medications on file prior to visit.     No Known Allergies  Past Medical History:  Diagnosis Date  . Arthralgia of temporomandibular joint   .  CAD, multiple vessel    a. cath 06/29/16: ostLM 40%, ostLAD 40%, pLAD 95%, ost-pLCx 60%, pLCx 95%, mLCx 60%, mRCA 95%, D2 50%, LVSF nl;  b. 07/2016 CABG x 4 (LIMA->LAD, VG->Diag, VG->OM, VG->RCA); c. 08/2016 Cath: 3VD w/ 4/4 patent grafts. LAD distal to LIMA has diff dzs->Med rx.  . Carotid arterial disease ([place])    a. 07/2016 s/p R CEA.  . Depression   . Diastolic dysfunction    a. echo 06/28/16: EF 50-55%, mild inf wall HK, GR1DD, mild MR, RV sys fxn nl, mildly dilated LA, PASP nl  . Fatty liver disease, nonalcoholic 8921  . HLD (hyperlipidemia)   . Labile hypertension    a. prior renal ngiogram negative for RAS in 03/2016; b. catecholamines and metanephrines normal, mildly elevated renin with normal aldosterone and normal ratio in 02/2016  . Obesity   . PTSD (post-traumatic stress disorder)   . Tobacco abuse    a. 2018 - cut back from 2 ppd to 0.5 ppd.  . Type 2 diabetes mellitus [facility]) January 2017    Past Surgical History:  Procedure Laterality Date  . ABDOMINAL AORTOGRAM W/LOWER EXTREMITY [name] 10/15/2018   Procedure: ABDOMINAL AORTOGRAM W/LOWER EXTREMITY;  Surgeon: Wellington Hampshire, MD;  Location: [place] [facility];  Service: Cardiovascular;  Laterality: [name];  . CARDIAC CATHETERIZATION [name] 06/29/2016   Procedure: Left Heart Cath and Coronary Angiography;  Surgeon: Minna Merritts, MD;  Location: [place] [facility];  Service: Cardiovascular;  Laterality: [name];  . CARDIAC CATHETERIZATION [name] 08/29/2016   Procedure: Left Heart Cath and Cors/Grafts Angiography;  Surgeon: Wellington Hampshire, MD;  Location: [place] [facility];  Service: Cardiovascular;  Laterality: [name];  . CESAREAN SECTION    . CHOLECYSTECTOMY    . CORONARY ARTERY BYPASS GRAFT [name] 07/06/2016   Procedure: CORONARY ARTERY BYPASS GRAFTING (CABG) x four, using left internal mammary artery and right leg greater saphenous vein harvested endoscopically;  Surgeon: Ivin Poot, MD;  Location: [place];  Service: Open Heart Surgery;   Laterality: [name];  . ENDARTERECTOMY Right 07/06/2016   Procedure: ENDARTERECTOMY CAROTID;  Surgeon: Rosetta Posner, MD;  Location: [place];  Service: Vascular;  Laterality: Right;  . PERIPHERAL VASCULAR CATHETERIZATION [name] 04/18/2016   Procedure: Renal Angiography;  Surgeon: Wellington Hampshire, MD;  Location: [place] [facility];  Service: Cardiovascular;  Laterality: [name];  . PERIPHERAL VASCULAR INTERVENTION Left 10/15/2018   Procedure: PERIPHERAL VASCULAR INTERVENTION;  Surgeon: Wellington Hampshire, MD;  Location: [place] [facility];  Service: Cardiovascular;  Laterality: Left;  Left superficial femoral  . TEE WITHOUT CARDIOVERSION [name] 07/06/2016  Procedure: TRANSESOPHAGEAL ECHOCARDIOGRAM (TEE);  Surgeon: Ivin Poot, MD;  Location: [place];  Service: Open Heart Surgery;  Laterality: [name];  . TONSILLECTOMY      Family History  Adopted: Yes  Problem Relation Age of Onset  . Diabetes Mother   . Diabetes Father   . Alcohol abuse Father   . Heart disease Father   . Drug abuse Father   . Stroke Sister   . Anxiety disorder Sister     Social History   Socioeconomic History  . Marital status: Married    Spouse name: Not on file  . Number of children: 1  . Years of education: 65  . Highest education level: Not on file  Occupational History  . Occupation: Disability  Social Needs  . Financial resource strain: Not on file  . Food insecurity:    Worry: Not on file    Inability: Not on file  . Transportation needs:    Medical: Not on file    Non-medical: Not on file  Tobacco Use  . Smoking status: Current Every Day Smoker    Packs/day: 1.00    Types: Cigarettes    Last attempt to quit: 05/23/2017    Years since quitting: 1.5  . Smokeless tobacco: Never Used  . Tobacco comment: patient smokes when she drives.   Substance and Sexual Activity  . Alcohol use: Yes    Alcohol/week: 0.0 standard drinks    Comment: socially  . Drug use: No  . Sexual activity: Yes    Partners: Male    Birth  control/protection: None  Lifestyle  . Physical activity:    Days per week: Not on file    Minutes per session: Not on file  . Stress: Not on file  Relationships  . Social connections:    Talks on phone: Not on file    Gets together: Not on file    Attends religious service: Not on file    Active member of club or organization: Not on file    Attends meetings of clubs or organizations: Not on file    Relationship status: Not on file  . Intimate partner violence:    Fear of current or ex partner: Not on file    Emotionally abused: Not on file    Physically abused: Not on file    Forced sexual activity: Not on file  Other Topics Concern  . Not on file  Social History Narrative   Lives with husband   Caffeine use: Drinks 2 cups coffee per day   Right handed   The PMH, PSH, Social History, Family History, Medications, and allergies have been reviewed in [facility], and have been updated if relevant.   Review of Systems  Constitutional: Negative.   HENT: Negative.   Eyes: Negative.   Respiratory: Negative.   Cardiovascular: Negative.   Gastrointestinal: Negative.   Endocrine: Negative.   Genitourinary: Negative.   Musculoskeletal: Negative.   Allergic/Immunologic: Negative.   Neurological: Positive for headaches. Negative for dizziness, tremors, seizures, syncope, facial asymmetry, speech difficulty, weakness, light-headedness and numbness.  Hematological: Negative.   All other systems reviewed and are negative.      Objective:    BP 138/90 (BP Location: Left Arm, Patient Position: Sitting, Cuff Size: Normal)   Pulse 86   Temp 99 F (37.2 C) (Oral)   Ht 5\' 7"  (1.702 m)   Wt 212 lb (96.2 kg)   LMP 12/01/2018 (Exact Date)   SpO2 95%   BMI 33.20  kg/m    Physical Exam Vitals signs and nursing note reviewed.  Constitutional:      General: She is not in acute distress.    Appearance: Normal appearance. She is not ill-appearing.  HENT:     Head: Normocephalic and  atraumatic.     Nose: Nose normal.     Mouth/Throat:     Mouth: Mucous membranes are moist.  Eyes:     Extraocular Movements: Extraocular movements intact.  Neck:     Musculoskeletal: Normal range of motion.  Cardiovascular:     Rate and Rhythm: Normal rate and regular rhythm.     Pulses: Normal pulses.     Heart sounds: Normal heart sounds.  Pulmonary:     Effort: Pulmonary effort is normal.     Breath sounds: Normal breath sounds.  Musculoskeletal: Normal range of motion.     Right lower leg: No edema.     Left lower leg: No edema.  Skin:    General: Skin is warm and dry.  Neurological:     General: No focal deficit present.     Mental Status: She is alert and oriented to person, place, and time.  Psychiatric:        Mood and Affect: Mood normal.        Behavior: Behavior normal.           Assessment & Plan:   Type 2 diabetes mellitus with other specified complication, without long-term current use of insulin (HCC) - Plan: POCT HgB A1C  HTN (hypertension), malignant  Intractable chronic cluster headache - Plan: Ambulatory referral to Pain Clinic No follow-ups on file.

## 2018-12-25 ENCOUNTER — Ambulatory Visit: Payer: BLUE CROSS/BLUE SHIELD | Admitting: Family Medicine

## 2018-12-25 ENCOUNTER — Encounter: Payer: Self-pay | Admitting: Family Medicine

## 2018-12-25 VITALS — BP 138/90 | HR 86 | Temp 99.0°F | Ht 67.0 in | Wt 212.0 lb

## 2018-12-25 DIAGNOSIS — E1169 Type 2 diabetes mellitus with other specified complication: Secondary | ICD-10-CM

## 2018-12-25 DIAGNOSIS — G44021 Chronic cluster headache, intractable: Secondary | ICD-10-CM

## 2018-12-25 DIAGNOSIS — I10 Essential (primary) hypertension: Secondary | ICD-10-CM

## 2018-12-25 LAB — POCT GLYCOSYLATED HEMOGLOBIN (HGB A1C)
HbA1c POC (<> result, manual entry): 9.9 % (ref 4.0–5.6)
Hemoglobin A1C: 9.9 % — AB (ref 4.0–5.6)

## 2018-12-25 MED ORDER — INSULIN GLARGINE 100 UNIT/ML SOLOSTAR PEN: 45.0000 [IU] | PEN_INJECTOR | Freq: Every day | SUBCUTANEOUS | 11 refills | Status: DC

## 2018-12-25 MED ORDER — HYDROCODONE-ACETAMINOPHEN 7.5-325 MG PO TABS: 1.0000 | ORAL_TABLET | Freq: Four times a day (QID) | ORAL | 0 refills | Status: DC | PRN

## 2018-12-25 MED ORDER — NALOXONE HCL 4 MG/0.1ML NA LIQD: NASAL | 0 refills | Status: DC

## 2018-12-25 MED ORDER — INSULIN GLARGINE 100 UNIT/ML SOLOSTAR PEN: 40.0000 [IU] | PEN_INJECTOR | Freq: Every day | SUBCUTANEOUS | 99 refills | Status: DC

## 2018-12-25 NOTE — Assessment & Plan Note (Signed)
Deteriorated. Followed by neuro but feel oxygen and current dose of narcotics are not helping. Agree with pt- referral to pain management is appropriate.  Referral placed.

## 2018-12-25 NOTE — Patient Instructions (Addendum)
Great to see you. Happy New Year!  We are referring you to pain management.  We are increasing your Lantus to 40  units daily.

## 2018-12-25 NOTE — Assessment & Plan Note (Signed)
Well controlled. No changes made today. 

## 2018-12-25 NOTE — Assessment & Plan Note (Signed)
Improved but not at goal.  Increase Lantus to 40 units qhs, continue SSI. Follow up in 3 months. The patient indicates understanding of these issues and agrees with the plan.

## 2018-12-30 ENCOUNTER — Other Ambulatory Visit: Payer: Self-pay | Admitting: Family Medicine

## 2018-12-31 ENCOUNTER — Other Ambulatory Visit: Payer: Self-pay | Admitting: Cardiovascular Disease

## 2018-12-31 ENCOUNTER — Other Ambulatory Visit (HOSPITAL_COMMUNITY): Payer: Self-pay | Admitting: Psychiatry

## 2018-12-31 DIAGNOSIS — F4310 Post-traumatic stress disorder, unspecified: Secondary | ICD-10-CM

## 2018-12-31 DIAGNOSIS — F410 Panic disorder [episodic paroxysmal anxiety] without agoraphobia: Secondary | ICD-10-CM

## 2019-01-07 ENCOUNTER — Other Ambulatory Visit
Admission: RE | Admit: 2019-01-07 | Discharge: 2019-01-07 | Disposition: A | Discharge status: Home / Self Care | Payer: BLUE CROSS/BLUE SHIELD | Source: Ambulatory Visit | Attending: Cardiovascular Disease | Admitting: Cardiovascular Disease

## 2019-01-07 ENCOUNTER — Other Ambulatory Visit: Payer: Self-pay

## 2019-01-07 ENCOUNTER — Encounter: Payer: Self-pay | Admitting: Nurse Practitioner

## 2019-01-07 ENCOUNTER — Ambulatory Visit: Payer: BLUE CROSS/BLUE SHIELD | Admitting: Nurse Practitioner

## 2019-01-07 VITALS — BP 203/117 | HR 109 | Temp 98.3°F | Resp 16 | Ht 67.0 in | Wt 205.7 lb

## 2019-01-07 DIAGNOSIS — R51 Headache: Secondary | ICD-10-CM | POA: Diagnosis not present

## 2019-01-07 DIAGNOSIS — Z789 Other specified health status: Secondary | ICD-10-CM

## 2019-01-07 DIAGNOSIS — E785 Hyperlipidemia, unspecified: Secondary | ICD-10-CM

## 2019-01-07 DIAGNOSIS — M899 Disorder of bone, unspecified: Secondary | ICD-10-CM | POA: Diagnosis not present

## 2019-01-07 DIAGNOSIS — R519 Headache, unspecified: Secondary | ICD-10-CM | POA: Insufficient documentation

## 2019-01-07 DIAGNOSIS — G894 Chronic pain syndrome: Secondary | ICD-10-CM | POA: Diagnosis not present

## 2019-01-07 DIAGNOSIS — Z79899 Other long term (current) drug therapy: Secondary | ICD-10-CM | POA: Diagnosis not present

## 2019-01-07 DIAGNOSIS — Z79891 Long term (current) use of opiate analgesic: Secondary | ICD-10-CM

## 2019-01-07 DIAGNOSIS — G8929 Other chronic pain: Secondary | ICD-10-CM

## 2019-01-07 LAB — HEPATIC FUNCTION PANEL
ALT: 46 U/L — ABNORMAL HIGH (ref 0–44)
AST: 20 U/L (ref 15–41)
Albumin: 4.2 g/dL (ref 3.5–5.0)
Alkaline Phosphatase: 88 U/L (ref 38–126)
Bilirubin, Direct: <0.1 mg/dL (ref 0.0–0.2)
Total Bilirubin: 0.4 mg/dL (ref 0.3–1.2)
Total Protein: 7.8 g/dL (ref 6.5–8.1)

## 2019-01-07 LAB — LIPID PANEL
Cholesterol: 234 mg/dL — ABNORMAL HIGH (ref 0–200)
HDL: 30 mg/dL — ABNORMAL LOW (ref >40)
LDL Cholesterol: UNDETERMINED mg/dL (ref 0–99)
Total CHOL/HDL Ratio: 7.8 RATIO
Triglycerides: 406 mg/dL — ABNORMAL HIGH (ref <150)
VLDL: UNDETERMINED mg/dL (ref 0–40)

## 2019-01-07 NOTE — Progress Notes (Signed)
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   . Fatty liver disease, nonalcoholic 1950  . Labile hypertension   . Obesity   . PTSD (post-traumatic stress disorder)   . Type 2 diabetes mellitus [facility]) January 2017   Constitutional Exam  General appearance: Well nourished, well developed, and well hydrated. In no apparent acute distress Vitals:   01/07/19 1033  BP: (!) 203/117  Pulse: (!) 109  Resp: 16  Temp: 98.3 F (36.8 C)  TempSrc: Oral  SpO2: 97%  Weight: 205 lb 11.2 oz (93.3 kg)  Height: '5\' 7"'$  (1.702 m)   BMI Assessment: Estimated body mass index is 32.22 kg/m as calculated from the following:   Height as of this encounter: '5\' 7"'$  (1.702 m).   Weight as of this encounter: 205 lb 11.2 oz (93.3 kg).  BMI interpretation table: BMI level Category Range association with higher incidence of chronic pain  <18 kg/m2 Underweight   18.5-24.9 kg/m2 Ideal body weight   25-29.9 kg/m2 Overweight Increased incidence by 20%  30-34.9 kg/m2 Obese (Class I) Increased incidence by 68%  35-39.9 kg/m2 Severe obesity (Class II) Increased incidence by 136%  >40 kg/m2 Extreme  obesity (Class III) Increased incidence by 254%   BMI Readings from Last 4 Encounters:  01/07/19 32.22 kg/m  12/25/18 33.20 kg/m  11/07/18 33.44 kg/m  10/22/18 33.60 kg/m   Wt Readings from Last 4 Encounters:  01/07/19 205 lb 11.2 oz (93.3 kg)  12/25/18 212 lb (96.2 kg)  11/07/18 213 lb 8 oz (96.8 kg)  10/22/18 214 lb 8 oz (97.3 kg)  Psych/Mental status: Alert, oriented x 3 (person, place, & time)       Eyes: PERLA Respiratory: No evidence of acute respiratory distress  Neurological: Cranial nerves II through XII intact  Cervical Spine Exam  Inspection: No masses, redness, or swelling  Alignment: Symmetrical Functional ROM: Unrestricted ROM      Stability: No instability detected Muscle strength & Tone: Functionally intact Sensory: Unimpaired Palpation: No palpable anomalies              Upper Extremity (UE) Exam    Side: Right upper extremity  Side: Left upper extremity  Inspection: No masses, redness, swelling, or asymmetry. No contractures  Inspection: No masses, redness, swelling, or asymmetry. No contractures  Functional ROM: Unrestricted ROM          Functional ROM: Unrestricted ROM          Muscle strength & Tone: Functionally intact  Muscle strength & Tone: Functionally intact  Sensory: Unimpaired  Sensory: Unimpaired  Palpation: No palpable anomalies              Palpation: No palpable anomalies              Specialized Test(s): Deferred         Specialized Test(s): Deferred          Thoracic Spine Exam  Inspection: No masses, redness, or swelling Alignment: Symmetrical Functional ROM: Unrestricted ROM Stability: No instability detected Sensory: Unimpaired Muscle strength & Tone: No palpable anomalies  Lumbar Spine Exam  Inspection: No masses, redness, or swelling Alignment: Symmetrical Functional ROM: Unrestricted ROM      Stability: No instability detected Muscle strength & Tone: Functionally intact Sensory: Unimpaired Palpation: No palpable anomalies        Provocative Tests: Lumbar Hyperextension and rotation test: evaluation deferred today       Patrick's Maneuver: evaluation deferred today                    Gait & Posture Assessment  Ambulation: Unassisted Gait: Relatively normal for age and body habitus Posture: WNL   Lower Extremity Exam    Side: Right lower extremity  Side: Left lower extremity  Inspection: No masses, redness, swelling, or asymmetry. No contractures  Inspection: No masses, redness, swelling, or asymmetry. No contractures  Functional ROM: Unrestricted ROM          Functional ROM: Unrestricted ROM          Muscle strength & Tone: Functionally intact  Muscle strength & Tone: Functionally intact  Sensory: Unimpaired  Sensory: Unimpaired  Palpation: No palpable anomalies  Palpation: No palpable anomalies   Assessment  Primary Diagnosis & Pertinent Problem List: The primary encounter diagnosis was Chronic right-sided headaches (Primary Area of Pain). Diagnoses of Chronic pain syndrome, Long term current use of opiate analgesic, Long term prescription benzodiazepine use, Pharmacologic therapy, Disorder of skeletal system, and Problems influencing health status were also pertinent to this visit.  Visit Diagnosis: 1. Chronic right-sided headaches (Primary Area of Pain)   2. Chronic pain syndrome   3. Long term current use of opiate analgesic   4. Long term prescription benzodiazepine use   5. Pharmacologic therapy   6. Disorder of skeletal system   7. Problems influencing health status    Plan of Care  Initial treatment plan:  Please be advised that as per protocol, today's visit has been an evaluation only. We have not taken over the patient's controlled substance management.  Problem-specific plan: No problem-specific Assessment & Plan notes found for this encounter.  Ordered Lab-work, Procedure(s), Referral(s), & Consult(s): Orders Placed This Encounter  Procedures  . Compliance Drug Analysis, Ur  . Comp.  Metabolic Panel (12)  . Magnesium  . Vitamin B12  . Sedimentation rate  .  25-Hydroxyvitamin D Lcms D2+D3  . C-reactive protein   Pharmacotherapy: Medications ordered:  No orders of the defined types were placed in this encounter.  Medications administered during this visit: Tomasa M. Hamler had no medications administered during this visit.   Pharmacotherapy under consideration:  Opioid Analgesics: The patient was informed that there is no guarantee that she would be a candidate for opioid analgesics. The decision will be made following CDC guidelines. This decision will be based on the results of diagnostic studies, as well as Erika [name]'s risk profile.  Membrane stabilizer: To be determined at a later time Muscle relaxant: To be determined at a later time NSAID: To be determined at a later time Other analgesic(s): To be determined at a later time   Interventional therapies under consideration: Erika [name] was informed that there is no guarantee that she would be a candidate for interventional therapies. The decision will be based on the results of diagnostic studies, as well as Erika [name]'s risk profile.  Possible procedure(s): Diagnostic right sided facial nerve block Diagnostic right-sided occipital nerve block Possible right-sided occipital nerve radiofrequency ablation Diagnostic Botox injections   Provider-requested follow-up: Return for 2nd Visit, w/ Dr. Dossie Arbour.  Future Appointments  Date Time Provider [place]  01/19/2019  9:30 AM Milinda Pointer, MD [facility] None  01/28/2019 10:45 AM Venancio Poisson, NP [facility] None  02/10/2019  1:40 PM Wellington Hampshire, MD [facility]  02/18/2019  8:40 AM Arfeen, Arlyce Harman, MD [facility] None  03/30/2019  9:20 AM Lucille Passy, MD [facility]    Primary Care Physician: Lucille Passy, MD Location: [facility] Note by:  Date: 01/07/2019; Time: 4:13 PM  Pain Score  Disclaimer: We use the NRS-11 scale. This is a self-reported, subjective measurement of pain severity with only modest accuracy. It is used primarily to identify changes within a particular patient. It must be understood that outpatient pain scales are significantly less accurate that those used for research, where they can be applied under ideal controlled circumstances with minimal exposure to variables. In reality, the score is likely to be a combination of pain intensity and pain affect, where pain affect describes the degree of emotional arousal or changes in action readiness caused by the sensory experience of pain. Factors such as social and work situation, setting, emotional state, anxiety levels, expectation, and prior pain experience may influence pain perception and show large inter-individual differences that may also be affected by time variables.  Patient instructions provided during this appointment: Patient Instructions   ____________________________________________________________________________________________  Appointment Policy Summary  It is our goal and responsibility to provide the medical community with assistance in the evaluation and management of patients with chronic pain. Unfortunately our resources are limited. Because we do not have an unlimited amount of time, or available appointments, we are required to closely monitor and manage their use. The following rules exist to maximize their use:  Patient's responsibilities: 1. Punctuality:  At what time should I arrive? You should be physically present in our office 30 minutes before your scheduled appointment. Your scheduled appointment is with your assigned healthcare provider. However, it takes 5-10 minutes to be "checked-in", and another 15 minutes for the nurses to do the admission. If you arrive to our office at the time you were given for your appointment, you will end up being at least 20-25 minutes late to your  appointment with the provider. 2. Tardiness:  What happens if I arrive only a few minutes  after my scheduled appointment time? You will need to reschedule your appointment. The cutoff is your appointment time. This is why it is so important that you arrive at least 30 minutes before that appointment. If you have an appointment scheduled for 10:00 AM and you arrive at 10:01, you will be required to reschedule your appointment.  3. Plan ahead:  Always assume that you will encounter traffic on your way in. Plan for it. If you are dependent on a driver, make sure they understand these rules and the need to arrive early. 4. Other appointments and responsibilities:  Avoid scheduling any other appointments before or after your pain clinic appointments.  5. Be prepared:  Write down everything that you need to discuss with your healthcare provider and give this information to the admitting nurse. Write down the medications that you will need refilled. Bring your pills and bottles (even the empty ones), to all of your appointments, except for those where a procedure is scheduled. 6. No children or pets:  Find someone to take care of them. It is not appropriate to bring them in. 7. Scheduling changes:  We request "advanced notification" of any changes or cancellations. 8. Advanced notification:  Defined as a time period of more than 24 hours prior to the originally scheduled appointment. This allows for the appointment to be offered to other patients. 9. Rescheduling:  When a visit is rescheduled, it will require the cancellation of the original appointment. For this reason they both fall within the category of "Cancellations".  10. Cancellations:  They require advanced notification. Any cancellation less than 24 hours before the  appointment will be recorded as a "No Show". 11. No Show:  Defined as an unkept appointment where the patient failed to notify or declare to the practice their intention or  inability to keep the appointment.  Corrective process for repeat offenders:  1. Tardiness: Three (3) episodes of rescheduling due to late arrivals will be recorded as one (1) "No Show". 2. Cancellation or reschedule: Three (3) cancellations or rescheduling will be recorded as one (1) "No Show". 3. "No Shows": Three (3) "No Shows" within a 12 month period will result in discharge from the practice. ____________________________________________________________________________________________   ______________________________________________________________________________________________  Specialty Pain Scale  Introduction:  There are significant differences in how pain is reported. The word pain usually refers to physical pain, but it is also a common synonym of suffering. The medical community uses a scale from 0 (zero) to 10 (ten) to report pain level. Zero (0) is described as "no pain", while ten (10) is described as "the worse pain you can imagine". The problem with this scale is that physical pain is reported along with suffering. Suffering refers to mental pain, or more often yet it refers to any unpleasant feeling, emotion or aversion associated with the perception of harm or threat of harm. It is the psychological component of pain.  Pain Specialists prefer to separate the two components. The pain scale used by this practice is the Verbal Numerical Rating Scale (VNRS-11). This scale is for the physical pain only. DO NOT INCLUDE how your pain psychologically affects you. This scale is for adults 103 years of age and older. It has 11 (eleven) levels. The 1st level is 0/10. This means: "right now, I have no pain". In the context of pain management, it also means: "right now, my physical pain is under control with the current therapy".  General Information:  The scale should reflect your current level  of pain. Unless you are specifically asked for the level of your worst pain, or your average  pain. If you are asked for one of these two, then it should be understood that it is over the past 24 hours.  Levels 1 (one) through 5 (five) are described below, and can be treated as an outpatient. Ambulatory pain management facilities such as ours are more than adequate to treat these levels. Levels 6 (six) through 10 (ten) are also described below, however, these must be treated as a hospitalized patient. While levels 6 (six) and 7 (seven) may be evaluated at an urgent care facility, levels 8 (eight) through 10 (ten) constitute medical emergencies and as such, they belong in a hospital's emergency department. When having these levels (as described below), do not come to our office. Our facility is not equipped to manage these levels. Go directly to an urgent care facility or an emergency department to be evaluated.  Definitions:  Activities of Daily Living (ADL): Activities of daily living (ADL or ADLs) is a term used in healthcare to refer to people's daily self-care activities. Health professionals often use a person's ability or inability to perform ADLs as a measurement of their functional status, particularly in regard to people post injury, with disabilities and the elderly. There are two ADL levels: Basic and Instrumental. Basic Activities of Daily Living (BADL  or BADLs) consist of self-care tasks that include: Bathing and showering; personal hygiene and grooming (including brushing/combing/styling hair); dressing; Toilet hygiene (getting to the toilet, cleaning oneself, and getting back up); eating and self-feeding (not including cooking or chewing and swallowing); functional mobility, often referred to as "transferring", as measured by the ability to walk, get in and out of bed, and get into and out of a chair; the broader definition (moving from one place to another while performing activities) is useful for people with different physical abilities who are still able to get around  independently. Basic ADLs include the things many people do when they get up in the morning and get ready to go out of the house: get out of bed, go to the toilet, bathe, dress, groom, and eat. On the average, loss of function typically follows a particular order. Hygiene is the first to go, followed by loss of toilet use and locomotion. The last to go is the ability to eat. When there is only one remaining area in which the person is independent, there is a 62.9% chance that it is eating and only a 3.5% chance that it is hygiene. Instrumental Activities of Daily Living (IADL or IADLs) are not necessary for fundamental functioning, but they let an individual live independently in a community. IADL consist of tasks that include: cleaning and maintaining the house; home establishment and maintenance; care of others (including selecting and supervising caregivers); care of pets; child rearing; managing money; managing financials (investments, etc.); meal preparation and cleanup; shopping for groceries and necessities; moving within the community; safety procedures and emergency responses; health management and maintenance (taking prescribed medications); and using the telephone or other form of communication.  Instructions:  Most patients tend to report their pain as a combination of two factors, their physical pain and their psychosocial pain. This last one is also known as "suffering" and it is reflection of how physical pain affects you socially and psychologically. From now on, report them separately.  From this point on, when asked to report your pain level, report only your physical pain. Use the following table for  reference.  Pain Clinic Pain Levels (0-5/10)  Pain Level Score  Description  No Pain 0   Mild pain 1 Nagging, annoying, but does not interfere with basic activities of daily living (ADL). Patients are able to eat, bathe, get dressed, toileting (being able to get on and off the toilet and  perform personal hygiene functions), transfer (move in and out of bed or a chair without assistance), and maintain continence (able to control bladder and bowel functions). Blood pressure and heart rate are unaffected. A normal heart rate for a healthy adult ranges from 60 to 100 bpm (beats per minute).   Mild to moderate pain 2 Noticeable and distracting. Impossible to hide from other people. More frequent flare-ups. Still possible to adapt and function close to normal. It can be very annoying and may have occasional stronger flare-ups. With discipline, patients may get used to it and adapt.   Moderate pain 3 Interferes significantly with activities of daily living (ADL). It becomes difficult to feed, bathe, get dressed, get on and off the toilet or to perform personal hygiene functions. Difficult to get in and out of bed or a chair without assistance. Very distracting. With effort, it can be ignored when deeply involved in activities.   Moderately severe pain 4 Impossible to ignore for more than a few minutes. With effort, patients may still be able to manage work or participate in some social activities. Very difficult to concentrate. Signs of autonomic nervous system discharge are evident: dilated pupils (mydriasis); mild sweating (diaphoresis); sleep interference. Heart rate becomes elevated (>115 bpm). Diastolic blood pressure (lower number) rises above 100 mmHg. Patients find relief in laying down and not moving.   Severe pain 5 Intense and extremely unpleasant. Associated with frowning face and frequent crying. Pain overwhelms the senses.  Ability to do any activity or maintain social relationships becomes significantly limited. Conversation becomes difficult. Pacing back and forth is common, as getting into a comfortable position is nearly impossible. Pain wakes you up from deep sleep. Physical signs will be obvious: pupillary dilation; increased sweating; goosebumps; brisk reflexes; cold, clammy  hands and feet; nausea, vomiting or dry heaves; loss of appetite; significant sleep disturbance with inability to fall asleep or to remain asleep. When persistent, significant weight loss is observed due to the complete loss of appetite and sleep deprivation.  Blood pressure and heart rate becomes significantly elevated. Caution: If elevated blood pressure triggers a pounding headache associated with blurred vision, then the patient should immediately seek attention at an urgent or emergency care unit, as these may be signs of an impending stroke.    Emergency Department Pain Levels (6-10/10)  Emergency Room Pain 6 Severely limiting. Requires emergency care and should not be seen or managed at an outpatient pain management facility. Communication becomes difficult and requires great effort. Assistance to reach the emergency department may be required. Facial flushing and profuse sweating along with potentially dangerous increases in heart rate and blood pressure will be evident.   Distressing pain 7 Self-care is very difficult. Assistance is required to transport, or use restroom. Assistance to reach the emergency department will be required. Tasks requiring coordination, such as bathing and getting dressed become very difficult.   Disabling pain 8 Self-care is no longer possible. At this level, pain is disabling. The individual is unable to do even the most "basic" activities such as walking, eating, bathing, dressing, transferring to a bed, or toileting. Fine motor skills are lost. It is difficult to think clearly.  Incapacitating pain 9 Pain becomes incapacitating. Thought processing is no longer possible. Difficult to remember your own name. Control of movement and coordination are lost.   The worst pain imaginable 10 At this level, most patients pass out from pain. When this level is reached, collapse of the autonomic nervous system occurs, leading to a sudden drop in blood pressure and heart  rate. This in turn results in a temporary and dramatic drop in blood flow to the brain, leading to a loss of consciousness. Fainting is one of the body's self defense mechanisms. Passing out puts the brain in a calmed state and causes it to shut down for a while, in order to begin the healing process.    Summary: 1. Refer to this scale when providing [name] with your pain level. 2. Be accurate and careful when reporting your pain level. This will help with your care. 3. Over-reporting your pain level will lead to loss of credibility. 4. Even a level of 1/10 means that there is pain and will be treated at our facility. 5. High, inaccurate reporting will be documented as "Symptom Exaggeration", leading to loss of credibility and suspicions of possible secondary gains such as obtaining more narcotics, or wanting to appear disabled, for fraudulent reasons. 6. Only pain levels of 5 or below will be seen at our facility. 7. Pain levels of 6 and above will be sent to the Emergency Department and the appointment cancelled. ______________________________________________________________________________________________  BMI Assessment: Estimated body mass index is 32.22 kg/m as calculated from the following:   Height as of this encounter: '5\' 7"'$  (1.702 m).   Weight as of this encounter: 205 lb 11.2 oz (93.3 kg).  BMI interpretation table: BMI level Category Range association with higher incidence of chronic pain  <18 kg/m2 Underweight   18.5-24.9 kg/m2 Ideal body weight   25-29.9 kg/m2 Overweight Increased incidence by 20%  30-34.9 kg/m2 Obese (Class I) Increased incidence by 68%  35-39.9 kg/m2 Severe obesity (Class II) Increased incidence by 136%  >40 kg/m2 Extreme obesity (Class III) Increased incidence by 254%   BMI Readings from Last 4 Encounters:  01/07/19 32.22 kg/m  12/25/18 33.20 kg/m  11/07/18 33.44 kg/m  10/22/18 33.60 kg/m   Wt Readings from Last 4 Encounters:  01/07/19 205 lb 11.2  oz (93.3 kg)  12/25/18 212 lb (96.2 kg)  11/07/18 213 lb 8 oz (96.8 kg)  10/22/18 214 lb 8 oz (97.3 kg)

## 2019-01-07 NOTE — Patient Instructions (Addendum)
____________________________________________________________________________________________  Appointment Policy Summary  It is our goal and responsibility to provide the medical community with assistance in the evaluation and management of patients with chronic pain. Unfortunately our resources are limited. Because we do not have an unlimited amount of time, or available appointments, we are required to closely monitor and manage their use. The following rules exist to maximize their use:  Patient's responsibilities: 1. Punctuality:  At what time should I arrive? You should be physically present in our office 30 minutes before your scheduled appointment. Your scheduled appointment is with your assigned healthcare provider. However, it takes 5-10 minutes to be "checked-in", and another 15 minutes for the nurses to do the admission. If you arrive to our office at the time you were given for your appointment, you will end up being at least 20-25 minutes late to your appointment with the provider. 2. Tardiness:  What happens if I arrive only a few minutes after my scheduled appointment time? You will need to reschedule your appointment. The cutoff is your appointment time. This is why it is so important that you arrive at least 30 minutes before that appointment. If you have an appointment scheduled for 10:00 AM and you arrive at 10:01, you will be required to reschedule your appointment.  3. Plan ahead:  Always assume that you will encounter traffic on your way in. Plan for it. If you are dependent on a driver, make sure they understand these rules and the need to arrive early. 4. Other appointments and responsibilities:  Avoid scheduling any other appointments before or after your pain clinic appointments.  5. Be prepared:  Write down everything that you need to discuss with your healthcare provider and give this information to the admitting nurse. Write down the medications that you will need  refilled. Bring your pills and bottles (even the empty ones), to all of your appointments, except for those where a procedure is scheduled. 6. No children or pets:  Find someone to take care of them. It is not appropriate to bring them in. 7. Scheduling changes:  We request "advanced notification" of any changes or cancellations. 8. Advanced notification:  Defined as a time period of more than 24 hours prior to the originally scheduled appointment. This allows for the appointment to be offered to other patients. 9. Rescheduling:  When a visit is rescheduled, it will require the cancellation of the original appointment. For this reason they both fall within the category of "Cancellations".  10. Cancellations:  They require advanced notification. Any cancellation less than 24 hours before the  appointment will be recorded as a "No Show". 11. No Show:  Defined as an unkept appointment where the patient failed to notify or declare to the practice their intention or inability to keep the appointment.  Corrective process for repeat offenders:  1. Tardiness: Three (3) episodes of rescheduling due to late arrivals will be recorded as one (1) "No Show". 2. Cancellation or reschedule: Three (3) cancellations or rescheduling will be recorded as one (1) "No Show". 3. "No Shows": Three (3) "No Shows" within a 12 month period will result in discharge from the practice. ____________________________________________________________________________________________   ______________________________________________________________________________________________  Specialty Pain Scale  Introduction:  There are significant differences in how pain is reported. The word pain usually refers to physical pain, but it is also a common synonym of suffering. The medical community uses a scale from 0 (zero) to 10 (ten) to report pain level. Zero (0) is described as "no pain",   while ten (10) is described as "the worse pain  you can imagine". The problem with this scale is that physical pain is reported along with suffering. Suffering refers to mental pain, or more often yet it refers to any unpleasant feeling, emotion or aversion associated with the perception of harm or threat of harm. It is the psychological component of pain.  Pain Specialists prefer to separate the two components. The pain scale used by this practice is the Verbal Numerical Rating Scale (VNRS-11). This scale is for the physical pain only. DO NOT INCLUDE how your pain psychologically affects you. This scale is for adults 21 years of age and older. It has 11 (eleven) levels. The 1st level is 0/10. This means: "right now, I have no pain". In the context of pain management, it also means: "right now, my physical pain is under control with the current therapy".  General Information:  The scale should reflect your current level of pain. Unless you are specifically asked for the level of your worst pain, or your average pain. If you are asked for one of these two, then it should be understood that it is over the past 24 hours.  Levels 1 (one) through 5 (five) are described below, and can be treated as an outpatient. Ambulatory pain management facilities such as ours are more than adequate to treat these levels. Levels 6 (six) through 10 (ten) are also described below, however, these must be treated as a hospitalized patient. While levels 6 (six) and 7 (seven) may be evaluated at an urgent care facility, levels 8 (eight) through 10 (ten) constitute medical emergencies and as such, they belong in a hospital's emergency department. When having these levels (as described below), do not come to our office. Our facility is not equipped to manage these levels. Go directly to an urgent care facility or an emergency department to be evaluated.  Definitions:  Activities of Daily Living (ADL): Activities of daily living (ADL or ADLs) is a term used in healthcare to refer to  people's daily self-care activities. Health professionals often use a person's ability or inability to perform ADLs as a measurement of their functional status, particularly in regard to people post injury, with disabilities and the elderly. There are two ADL levels: Basic and Instrumental. Basic Activities of Daily Living (BADL  or BADLs) consist of self-care tasks that include: Bathing and showering; personal hygiene and grooming (including brushing/combing/styling hair); dressing; Toilet hygiene (getting to the toilet, cleaning oneself, and getting back up); eating and self-feeding (not including cooking or chewing and swallowing); functional mobility, often referred to as "transferring", as measured by the ability to walk, get in and out of bed, and get into and out of a chair; the broader definition (moving from one place to another while performing activities) is useful for people with different physical abilities who are still able to get around independently. Basic ADLs include the things many people do when they get up in the morning and get ready to go out of the house: get out of bed, go to the toilet, bathe, dress, groom, and eat. On the average, loss of function typically follows a particular order. Hygiene is the first to go, followed by loss of toilet use and locomotion. The last to go is the ability to eat. When there is only one remaining area in which the person is independent, there is a 62.9% chance that it is eating and only a 3.5% chance that it is hygiene. Instrumental Activities   of Daily Living (IADL or IADLs) are not necessary for fundamental functioning, but they let an individual live independently in a community. IADL consist of tasks that include: cleaning and maintaining the house; home establishment and maintenance; care of others (including selecting and supervising caregivers); care of pets; child rearing; managing money; managing financials (investments, etc.); meal preparation  and cleanup; shopping for groceries and necessities; moving within the community; safety procedures and emergency responses; health management and maintenance (taking prescribed medications); and using the telephone or other form of communication.  Instructions:  Most patients tend to report their pain as a combination of two factors, their physical pain and their psychosocial pain. This last one is also known as "suffering" and it is reflection of how physical pain affects you socially and psychologically. From now on, report them separately.  From this point on, when asked to report your pain level, report only your physical pain. Use the following table for reference.  Pain Clinic Pain Levels (0-5/10)  Pain Level Score  Description  No Pain 0   Mild pain 1 Nagging, annoying, but does not interfere with basic activities of daily living (ADL). Patients are able to eat, bathe, get dressed, toileting (being able to get on and off the toilet and perform personal hygiene functions), transfer (move in and out of bed or a chair without assistance), and maintain continence (able to control bladder and bowel functions). Blood pressure and heart rate are unaffected. A normal heart rate for a healthy adult ranges from 60 to 100 bpm (beats per minute).   Mild to moderate pain 2 Noticeable and distracting. Impossible to hide from other people. More frequent flare-ups. Still possible to adapt and function close to normal. It can be very annoying and may have occasional stronger flare-ups. With discipline, patients may get used to it and adapt.   Moderate pain 3 Interferes significantly with activities of daily living (ADL). It becomes difficult to feed, bathe, get dressed, get on and off the toilet or to perform personal hygiene functions. Difficult to get in and out of bed or a chair without assistance. Very distracting. With effort, it can be ignored when deeply involved in activities.   Moderately severe pain  4 Impossible to ignore for more than a few minutes. With effort, patients may still be able to manage work or participate in some social activities. Very difficult to concentrate. Signs of autonomic nervous system discharge are evident: dilated pupils (mydriasis); mild sweating (diaphoresis); sleep interference. Heart rate becomes elevated (>115 bpm). Diastolic blood pressure (lower number) rises above 100 mmHg. Patients find relief in laying down and not moving.   Severe pain 5 Intense and extremely unpleasant. Associated with frowning face and frequent crying. Pain overwhelms the senses.  Ability to do any activity or maintain social relationships becomes significantly limited. Conversation becomes difficult. Pacing back and forth is common, as getting into a comfortable position is nearly impossible. Pain wakes you up from deep sleep. Physical signs will be obvious: pupillary dilation; increased sweating; goosebumps; brisk reflexes; cold, clammy hands and feet; nausea, vomiting or dry heaves; loss of appetite; significant sleep disturbance with inability to fall asleep or to remain asleep. When persistent, significant weight loss is observed due to the complete loss of appetite and sleep deprivation.  Blood pressure and heart rate becomes significantly elevated. Caution: If elevated blood pressure triggers a pounding headache associated with blurred vision, then the patient should immediately seek attention at an urgent or emergency care unit, as   these may be signs of an impending stroke.    Emergency Department Pain Levels (6-10/10)  Emergency Room Pain 6 Severely limiting. Requires emergency care and should not be seen or managed at an outpatient pain management facility. Communication becomes difficult and requires great effort. Assistance to reach the emergency department may be required. Facial flushing and profuse sweating along with potentially dangerous increases in heart rate and blood pressure  will be evident.   Distressing pain 7 Self-care is very difficult. Assistance is required to transport, or use restroom. Assistance to reach the emergency department will be required. Tasks requiring coordination, such as bathing and getting dressed become very difficult.   Disabling pain 8 Self-care is no longer possible. At this level, pain is disabling. The individual is unable to do even the most "basic" activities such as walking, eating, bathing, dressing, transferring to a bed, or toileting. Fine motor skills are lost. It is difficult to think clearly.   Incapacitating pain 9 Pain becomes incapacitating. Thought processing is no longer possible. Difficult to remember your own name. Control of movement and coordination are lost.   The worst pain imaginable 10 At this level, most patients pass out from pain. When this level is reached, collapse of the autonomic nervous system occurs, leading to a sudden drop in blood pressure and heart rate. This in turn results in a temporary and dramatic drop in blood flow to the brain, leading to a loss of consciousness. Fainting is one of the body's self defense mechanisms. Passing out puts the brain in a calmed state and causes it to shut down for a while, in order to begin the healing process.    Summary: 1. Refer to this scale when providing [name] with your pain level. 2. Be accurate and careful when reporting your pain level. This will help with your care. 3. Over-reporting your pain level will lead to loss of credibility. 4. Even a level of 1/10 means that there is pain and will be treated at our facility. 5. High, inaccurate reporting will be documented as "Symptom Exaggeration", leading to loss of credibility and suspicions of possible secondary gains such as obtaining more narcotics, or wanting to appear disabled, for fraudulent reasons. 6. Only pain levels of 5 or below will be seen at our facility. 7. Pain levels of 6 and above will be sent to the  Emergency Department and the appointment cancelled. ______________________________________________________________________________________________  BMI Assessment: Estimated body mass index is 32.22 kg/m as calculated from the following:   Height as of this encounter: 5\' 7"  (1.702 m).   Weight as of this encounter: 205 lb 11.2 oz (93.3 kg).  BMI interpretation table: BMI level Category Range association with higher incidence of chronic pain  <18 kg/m2 Underweight   18.5-24.9 kg/m2 Ideal body weight   25-29.9 kg/m2 Overweight Increased incidence by 20%  30-34.9 kg/m2 Obese (Class I) Increased incidence by 68%  35-39.9 kg/m2 Severe obesity (Class II) Increased incidence by 136%  >40 kg/m2 Extreme obesity (Class III) Increased incidence by 254%   BMI Readings from Last 4 Encounters:  01/07/19 32.22 kg/m  12/25/18 33.20 kg/m  11/07/18 33.44 kg/m  10/22/18 33.60 kg/m   Wt Readings from Last 4 Encounters:  01/07/19 205 lb 11.2 oz (93.3 kg)  12/25/18 212 lb (96.2 kg)  11/07/18 213 lb 8 oz (96.8 kg)  10/22/18 214 lb 8 oz (97.3 kg)

## 2019-01-07 NOTE — Progress Notes (Signed)
Safety precautions to be maintained throughout the outpatient stay will include: orient to surroundings, keep bed in low position, maintain call bell within reach at all times, provide assistance with transfer out of bed and ambulation.  

## 2019-01-11 ENCOUNTER — Other Ambulatory Visit (HOSPITAL_COMMUNITY): Payer: Self-pay | Admitting: Psychiatry

## 2019-01-11 DIAGNOSIS — F331 Major depressive disorder, recurrent, moderate: Secondary | ICD-10-CM

## 2019-01-12 LAB — COMP. METABOLIC PANEL (12)
AST: 17 IU/L (ref 0–40)
Albumin/Globulin Ratio: 1.4 (ref 1.2–2.2)
Albumin: 4.6 g/dL (ref 3.5–5.5)
Alkaline Phosphatase: 99 IU/L (ref 39–117)
BUN/Creatinine Ratio: 18 (ref 9–23)
BUN: 13 mg/dL (ref 6–24)
Bilirubin Total: 0.3 mg/dL (ref 0.0–1.2)
CREATININE: 0.71 mg/dL (ref 0.57–1.00)
Calcium: 10.7 mg/dL — ABNORMAL HIGH (ref 8.7–10.2)
Chloride: 96 mmol/L (ref 96–106)
GFR calc Af Amer: 119 mL/min/{1.73_m2} (ref >59)
GFR calc non Af Amer: 103 mL/min/{1.73_m2} (ref >59)
Globulin, Total: 3.2 g/dL (ref 1.5–4.5)
Glucose: 209 mg/dL — ABNORMAL HIGH (ref 65–99)
Potassium: 4.8 mmol/L (ref 3.5–5.2)
Sodium: 137 mmol/L (ref 134–144)
Total Protein: 7.8 g/dL (ref 6.0–8.5)

## 2019-01-12 LAB — MAGNESIUM: Magnesium: 2 mg/dL (ref 1.6–2.3)

## 2019-01-12 LAB — 25-HYDROXY VITAMIN D LCMS D2+D3
25-Hydroxy, Vitamin D-2: <1 ng/mL
25-Hydroxy, Vitamin D-3: 15 ng/mL
25-Hydroxy, Vitamin D: 16 ng/mL — ABNORMAL LOW

## 2019-01-12 LAB — C-REACTIVE PROTEIN: CRP: 15 mg/L — ABNORMAL HIGH (ref 0–10)

## 2019-01-12 LAB — VITAMIN B12: Vitamin B-12: 476 pg/mL (ref 232–1245)

## 2019-01-12 LAB — SEDIMENTATION RATE: SED RATE: 73 mm/h — AB (ref 0–32)

## 2019-01-13 LAB — COMPLIANCE DRUG ANALYSIS, UR

## 2019-01-15 ENCOUNTER — Encounter: Payer: Self-pay | Admitting: Nurse Practitioner

## 2019-01-15 ENCOUNTER — Other Ambulatory Visit: Payer: Self-pay | Admitting: Nurse Practitioner

## 2019-01-15 DIAGNOSIS — R7982 Elevated C-reactive protein (CRP): Secondary | ICD-10-CM | POA: Insufficient documentation

## 2019-01-15 DIAGNOSIS — R700 Elevated erythrocyte sedimentation rate: Secondary | ICD-10-CM | POA: Insufficient documentation

## 2019-01-15 NOTE — Progress Notes (Signed)
Patient's Name: Erika [name]  MRN: [id number]  Referring Provider: Lucille Passy, MD  DOB: 01-Feb-1971  PCP: Lucille Passy, MD  DOS: 01/19/2019  Note by: Gaspar Cola, MD  Service setting: Ambulatory outpatient  Specialty: Interventional Pain Management  Location: [facility] (AMB) Pain Management Facility    Patient type: Established   Primary Reason(s) for Visit: Encounter for evaluation before starting new chronic pain management plan of care (Level of risk: moderate) CC: Headache ("cluster")  HPI  Erika [name] is a 48 y.o. year old, female patient, who comes today for a follow-up evaluation to review the test results and decide on a treatment plan. She has HLD (hyperlipidemia); ADJUSTMENT DISORDER WITH MIXED FEATURES; Cluster headache; HTN (hypertension), malignant; Agoraphobia with panic attacks; MDD (major depressive disorder); Proteinuria; Renal artery stenosis ([place]); Chest pain with high risk for cardiac etiology; NSTEMI (non-ST elevated myocardial infarction) ([place]); Elevated troponin; Essential hypertension, malignant; Tobacco abuse; Essential hypertension; Malignant hypertension; Diabetes ([place]); CAD in native artery; Bradycardia; CAD (coronary artery disease); Carotid stenosis; S/P CABG x 4; Constipation; Hypertension, accelerated with heart disease, without CHF; Insomnia; Major depressive disorder, recurrent episode, moderate ([place]); Sepsis ([place]); Hyperosmolar non-ketotic state in patient with type 2 diabetes mellitus ([place]); Elevated troponin I level; Other chronic pain; Bilateral hip pain; Chronic pain of both ankles; Tendinopathy of right gluteus medius; Tendinopathy of left gluteus medius; Myalgia; Edema; Insect bite; Opiate use; Chronic pain syndrome; Long term current use of opiate analgesic; Long term prescription benzodiazepine use; Pharmacologic therapy; Disorder of skeletal system; Problems influencing health status; Chronic headaches (Primary Area of Pain) (Right); Elevated  C-reactive protein (CRP); Elevated sed rate; Vitamin D deficiency; Neurogenic pain; and Chronic anticoagulation (PLAVIX) on their problem list. Her primarily concern today is the Headache ("cluster")  Pain Assessment: Location: Right Head Radiating: right ear and eye Onset: More than a month ago Duration: Chronic pain Quality: Constant, Pressure, Aching Severity: 8 /10 (subjective, self-reported pain score)  Note: Reported level is inconsistent with clinical observations. Clinically the patient looks like a 2/10 A 2/10 is viewed as "Mild to Moderate" and described as noticeable and distracting. Impossible to hide from other people. More frequent flare-ups. Still possible to adapt and function close to normal. It can be very annoying and may have occasional stronger flare-ups. With discipline, patients may get used to it and adapt. Information on the proper use of the pain scale provided to the patient today. When using our objective Pain Scale, levels between 6 and 10/10 are said to belong in an emergency room, as it progressively worsens from a 6/10, described as severely limiting, requiring emergency care not usually available at an outpatient pain management facility. At a 6/10 level, communication becomes difficult and requires great effort. Assistance to reach the emergency department may be required. Facial flushing and profuse sweating along with potentially dangerous increases in heart rate and blood pressure will be evident. Timing: Constant Modifying factors: Hydrocodone,O2 therapy BP: (!) 198/117  HR: (!) 103  Erika [name] comes in today for a follow-up visit after her initial evaluation on 01/07/2019. Today we went over the results of her tests. These were explained in "Layman's terms". During today's appointment we went over my diagnostic impression, as well as the proposed treatment plan.  Her primary area of pain is headaches.  She admits that the headache came on while at work in  2013.  She admits that it was associated with blurred vision at that time and a stroke was ruled out.  The pain is on the right side of her head.  She admits that involves the forehead, top of head, right eye and ear.  She does have occasional pain that goes down into her jaw.  She denies any previous surgery, interventional therapy or physical therapy.  She admits that she did have a recent head CT.  Pain is right-sided and starts inside of ear. Is present all of the time, but with flare-ups as often as 2-3/wk.   She has a significant cardiac history and is currently on antiplatelet therapy.  In considering the treatment plan options, Erika [name] was reminded that I no longer take patients for medication management only. I asked her to let me know if she had no intention of taking advantage of the interventional therapies, so that we could make arrangements to provide this space to someone interested. I also made it clear that undergoing interventional therapies for the purpose of getting pain medications is very inappropriate on the part of a patient, and it will not be tolerated in this practice. This type of behavior would suggest true addiction and therefore it requires referral to an addiction specialist.   Further details on both, my assessment(s), as well as the proposed treatment plan, please see below.  Controlled Substance Pharmacotherapy Assessment REMS (Risk Evaluation and Mitigation Strategy)  Analgesic: Hydrocodone/acetaminophen 7.5/325 mg twice daily (fill date 12/14/2018) (15 mg/day of hydrocodone) Highest recorded MME/day: 75 mg/day MME/day: 15 mg/day  Pill Count: None expected due to no prior prescriptions written by our practice. [name], RN  01/19/2019  9:56 AM  Sign when Signing Visit Safety precautions to be maintained throughout the outpatient stay will include: orient to surroundings, keep bed in low position, maintain call bell within reach at all times,  provide assistance with transfer out of bed and ambulation.    Pharmacokinetics: Liberation and absorption (onset of action): WNL Distribution (time to peak effect): WNL Metabolism and excretion (duration of action): WNL         Pharmacodynamics: Desired effects: Analgesia: Ms. Meloni reports >50% benefit. Functional ability: Patient reports that medication allows her to accomplish basic ADLs Clinically meaningful improvement in function (CMIF): Sustained CMIF goals met Perceived effectiveness: Described as relatively effective, allowing for increase in activities of daily living (ADL) Undesirable effects: Side-effects or Adverse reactions: None reported Monitoring: [place] PMP: Online review of the past 66-monthperiod previously conducted. Not applicable at this point since we have not taken over the patient's medication management yet. List of other Serum/Urine Drug Screening Test(s):  No results found. List of all UDS test(s) done:  Lab Results  Component Value Date   SUMMARY FINAL 01/07/2019   Last UDS on record: Summary  Date Value Ref Range Status  01/07/2019 FINAL  Final    Comment:    ==================================================================== TOXASSURE COMP DRUG ANALYSIS,UR ==================================================================== Test                             Result       Flag       Units Drug Present and Declared for Prescription Verification   7-aminoclonazepam              115          EXPECTED   ng/mg creat    7-aminoclonazepam is an expected metabolite of clonazepam. Source    of clonazepam is a scheduled prescription medication.   Hydrocodone  732          EXPECTED   ng/mg creat   Hydromorphone                  158          EXPECTED   ng/mg creat   Dihydrocodeine                 212          EXPECTED   ng/mg creat   Norhydrocodone                 1339         EXPECTED   ng/mg creat    Sources of hydrocodone include  scheduled prescription    medications. Hydromorphone, dihydrocodeine and norhydrocodone are    expected metabolites of hydrocodone. Hydromorphone and    dihydrocodeine are also available as scheduled prescription    medications.   Cyclobenzaprine                PRESENT      EXPECTED   Desmethylcyclobenzaprine       PRESENT      EXPECTED    Desmethylcyclobenzaprine is an expected metabolite of    cyclobenzaprine.   Chlorpromazine                 PRESENT      EXPECTED   Acetaminophen                  PRESENT      EXPECTED Drug Absent but Declared for Prescription Verification   Lamotrigine                    Not Detected UNEXPECTED   Fluoxetine                     Not Detected UNEXPECTED   Salicylate                     Not Detected UNEXPECTED    Aspirin, as indicated in the declared medication list, is not    always detected even when used as directed.   Benztropine                    Not Detected UNEXPECTED ==================================================================== Test                      Result    Flag   Units      Ref Range   Creatinine              165              mg/dL      >=20 ==================================================================== Declared Medications:  The flagging and interpretation on this report are based on the  following declared medications.  Unexpected results may arise from  inaccuracies in the declared medications.  **Note: The testing scope of this panel includes these medications:  Benztropine (Cogentin)  Chlorpromazine (Thorazine)  Clonazepam (Klonopin)  Cyclobenzaprine (Fexmid)  Fluoxetine  Hydrocodone (Norco)  Lamotrigine (Lamictal)  **Note: The testing scope of this panel does not include small to  moderate amounts of these reported medications:  Acetaminophen (Norco)  Aspirin (Aspirin 81)  **Note: The testing scope of this panel does not include following  reported medications:  Albuterol  Alirocumab  Carvedilol (Coreg)   Clopidogrel (Plavix)  Insulin  Insulin (Lantus)  Isosorbide (Imdur)  Losartan (Cozaar)  Mouthwash (Magic Mouthwash)  Naloxone (Narcan)  Nitroglycerin (Nitrostat)  Ondansetron (Zofran)  Rosuvastatin (Crestor)  Spironolactone (Aldactone)  Triamcinolone (Kenalog) ==================================================================== For clinical consultation, please call [phone]. ====================================================================    UDS interpretation: No unexpected findings.          Medication Assessment Form: Patient introduced to form today Treatment compliance: Treatment may start today if patient agrees with proposed plan. Evaluation of compliance is not applicable at this point Risk Assessment Profile: Aberrant behavior: See initial evaluations. None observed or detected today Comorbid factors increasing risk of overdose: See initial evaluation. No additional risks detected today Opioid risk tool (ORT) (Total Score): 3 Personal History of Substance Abuse (SUD-Substance use disorder):  Alcohol: Negative  Illegal Drugs: Negative  Rx Drugs: Negative  ORT Risk Level calculation: Low Risk Risk of substance use disorder (SUD): Low Opioid Risk Tool - 01/19/19 0955      Family History of Substance Abuse   Alcohol  Positive Female    Illegal Drugs  Negative    Rx Drugs  Negative      Personal History of Substance Abuse   Alcohol  Negative    Illegal Drugs  Negative    Rx Drugs  Negative      Age   Age between 41-45 years   Yes      History of Preadolescent Sexual Abuse   History of Preadolescent Sexual Abuse  Negative or Female      Psychological Disease   Psychological Disease  Negative    Depression  Positive      Total Score   Opioid Risk Tool Scoring  3    Opioid Risk Interpretation  Low Risk      ORT Scoring interpretation table:  Score <3 = Low Risk for SUD  Score between 4-7 = Moderate Risk for SUD  Score >8 = High Risk for Opioid  Abuse   Risk Mitigation Strategies:  Patient opioid safety counseling: No controlled substances prescribed. Patient-Prescriber Agreement (PPA): No agreement signed.  Controlled substance notification to other providers: None required. No opioid therapy.  Pharmacologic Plan: No opioid analgesic prescribed.             Laboratory Chemistry  Inflammation Markers (CRP: Acute Phase) (ESR: Chronic Phase) Lab Results  Component Value Date   CRP 15 (H) 01/07/2019   ESRSEDRATE 73 (H) 01/07/2019   LATICACIDVEN 2.3 (HH) 05/19/2017                         Rheumatology Markers No results found.  Renal Function Markers Lab Results  Component Value Date   BUN 13 01/07/2019   CREATININE 0.71 01/07/2019   BCR 18 01/07/2019   GFRAA 119 01/07/2019   GFRNONAA 103 01/07/2019                             Hepatic Function Markers Lab Results  Component Value Date   AST 17 01/07/2019   ALT 46 (H) 01/07/2019   ALBUMIN 4.6 01/07/2019   ALKPHOS 99 01/07/2019   LIPASE 34 05/28/2009                        Electrolytes Lab Results  Component Value Date   NA 137 01/07/2019   K 4.8 01/07/2019   CL 96 01/07/2019   CALCIUM 10.7 (H) 01/07/2019   MG 2.0 01/07/2019   PHOS 3.1 05/19/2017  Neuropathy Markers Lab Results  Component Value Date   VITAMINB12 476 01/07/2019   HGBA1C 9.9 (A) 12/25/2018   HGBA1C 9.9 12/25/2018   HIV Non Reactive 05/19/2017                        CNS Tests No results found.  Bone Pathology Markers Lab Results  Component Value Date   VD25OH 18 (L) 04/02/2013   25OHVITD1 16 (L) 01/07/2019   25OHVITD2 <1.0 01/07/2019   25OHVITD3 15 01/07/2019                         Coagulation Parameters Lab Results  Component Value Date   INR 1.00 05/19/2017   LABPROT 13.2 05/19/2017   APTT 26 05/19/2017   PLT 361 10/10/2018   DDIMER 0.42 06/27/2016                        Cardiovascular Markers Lab Results  Component Value Date   BNP 21.0  06/30/2018   CKTOTAL 40 06/24/2018   CKMB < 0.5 (L) 01/11/2015   TROPONINI <0.03 12/28/2017   HGB 15.5 (H) 10/10/2018   HCT 46.1 (H) 10/10/2018                         CA Markers No results found.  Note: Lab results reviewed.  Recent Diagnostic Imaging Review  Lumbosacral Imaging: Lumbar DG 2-3 views:  Results for orders placed during the hospital encounter of 02/13/13  DG Lumbar Spine 2-3 Views   Narrative *RADIOLOGY REPORT*  Clinical Data: Back pain  LUMBAR SPINE - 2-3 VIEW  Comparison: None.  Findings: Normal alignment.  No fracture.  Early disc degeneration L4-5.  Negative for mass or pars defect.  IMPRESSION: Early disc degeneration L4-5.  No acute abnormality.   Original Report Authenticated By: Carl Best, M.D.    Knee Imaging: Knee-L DG 4 views:  Results for orders placed during the hospital encounter of 07/04/04  DG Knee Complete 4 Views Left   Narrative Clinical Data: Pain in the left knee.   FOUR VIEW, LEFT KNEE - 07/05/2004  No prior studies.   There is no evidence of fracture, dislocation, or other significant bone abnormality. There is no evidence of joint effusion.  IMPRESSION  Normal study.        Provider: Marinus Maw   Complexity Note: Imaging results reviewed. Results shared with Ms. Hamme, using Layman's terms.                         Meds   Current Outpatient Medications:  .  albuterol (PROVENTIL HFA;VENTOLIN HFA) 108 (90 Base) MCG/ACT inhaler, Inhale 2 puffs into the lungs every 6 (six) hours as needed for wheezing., Disp: 1 Inhaler, Rfl: 0 .  aspirin EC 81 MG tablet, Take 1 tablet (81 mg total) by mouth daily., Disp: 90 tablet, Rfl: 3 .  benztropine (COGENTIN) 0.5 MG tablet, Take 1 tablet (0.5 mg total) by mouth daily., Disp: 90 tablet, Rfl: 0 .  carvedilol (COREG) 25 MG tablet, TAKE 1 TABLET (25 MG TOTAL) BY MOUTH 2 (TWO) TIMES DAILY WITH A MEAL., Disp: 60 tablet, Rfl: 1 .  chlorproMAZINE (THORAZINE) 25 MG tablet, Take 1 tablet  (25 mg total) by mouth at bedtime., Disp: 90 tablet, Rfl: 0 .  clonazePAM (KLONOPIN) 0.5 MG tablet, Take one tab three times a day  and 4th as needed, Disp: 100 tablet, Rfl: 1 .  clopidogrel (PLAVIX) 75 MG tablet, Take 1 tablet (75 mg total) by mouth daily., Disp: 30 tablet, Rfl: 11 .  cyclobenzaprine (FEXMID) 7.5 MG tablet, TAKE 1 TABLET BY MOUTH 3 TIMES A DAY AS NEEDED FOR MUSCLE SPASMS, Disp: 60 tablet, Rfl: 2 .  FLUoxetine HCl 60 MG TABS, Take 60 mg by mouth daily., Disp: 90 tablet, Rfl: 0 .  HYDROcodone-acetaminophen (NORCO) 7.5-325 MG tablet, Take 1 tablet by mouth every 6 (six) hours as needed for moderate pain., Disp: 60 tablet, Rfl: 0 .  Insulin Glargine (LANTUS SOLOSTAR) 100 UNIT/ML Solostar Pen, Inject 40 Units into the skin daily., Disp: 5 pen, Rfl: PRN .  insulin NPH-regular Human (NOVOLIN 70/30 RELION) (70-30) 100 UNIT/ML injection, Inject 5u SQ bid; but follow flow chart and may increase to 25u depending on flow chart up to tid, Disp: 10 mL, Rfl: 2 .  Insulin Syringe-Needle U-100 29G X 1/2" 0.3 ML MISC, 1 each by Does not apply route 2 (two) times daily., Disp: 30 each, Rfl: 0 .  isosorbide mononitrate (IMDUR) 30 MG 24 hr tablet, TAKE 1 TABLET (30 MG TOTAL) BY MOUTH DAILY. PLEASE KEEP APPOINTMENT FOR FURTHER REFILLS! THANKS :), Disp: 90 tablet, Rfl: 3 .  lamoTRIgine (LAMICTAL) 200 MG tablet, Take 1 tablet (200 mg total) by mouth daily., Disp: 90 tablet, Rfl: 0 .  losartan (COZAAR) 100 MG tablet, Take 1 tablet (100 mg total) by mouth daily., Disp: 90 tablet, Rfl: 1 .  magic mouthwash SOLN, Take 15 mLs by mouth 3 (three) times daily as needed for mouth pain., Disp: 250 mL, Rfl: 1 .  naloxone (NARCAN) nasal spray 4 mg/0.1 mL, 4 mg (contents of 1 nasal spray) as a single dose in one nostril; may repeat every 2 to 3 minutes in alternating nostrils until medical assistance becomes available, Disp: 1 kit, Rfl: 0 .  nitroGLYCERIN (NITROSTAT) 0.4 MG SL tablet, PLACE 1 TABLET (0.4 MG TOTAL) UNDER  THE TONGUE EVERY 5 (FIVE) MINUTES AS NEEDED FOR CHEST PAIN., Disp: 25 tablet, Rfl: PRN .  ondansetron (ZOFRAN) 4 MG tablet, TAKE 1 TABLET (4 MG TOTAL) BY MOUTH DAILY AS NEEDED., Disp: 10 tablet, Rfl: 2 .  rosuvastatin (CRESTOR) 10 MG tablet, Take 1 tablet (10 mg total) by mouth daily., Disp: 90 tablet, Rfl: 3 .  spironolactone (ALDACTONE) 25 MG tablet, Take 1 tablet (25 mg total) by mouth daily., Disp: 30 tablet, Rfl: 5 .  triamcinolone cream (KENALOG) 0.1 %, Apply 1 application topically 2 (two) times daily., Disp: 30 g, Rfl: 0 .  Alirocumab (PRALUENT) 150 MG/ML SOAJ, Inject 150 mg into the skin every 14 (fourteen) days., Disp: 2 pen, Rfl: 11 .  calcium carbonate (CALCIUM 600) 600 MG TABS tablet, Take 1 tablet (600 mg total) by mouth 2 (two) times daily with a meal for 30 days., Disp: 60 tablet, Rfl: 0 .  Cholecalciferol (VITAMIN D3) 125 MCG (5000 UT) CAPS, Take 1 capsule (5,000 Units total) by mouth daily with breakfast. Take along with calcium and magnesium., Disp: 30 capsule, Rfl: 5 .  ergocalciferol (VITAMIN D2) 1.25 MG (50000 UT) capsule, Take 1 capsule (50,000 Units total) by mouth 2 (two) times a week. X 6 weeks., Disp: 12 capsule, Rfl: 0 .  gabapentin (NEURONTIN) 100 MG capsule, Take 1-3 capsules (100-300 mg total) by mouth at bedtime for 30 days. Follow written titration schedule., Disp: 90 capsule, Rfl: 0 .  Magnesium 500 MG CAPS, Take 1 capsule (  500 mg total) by mouth 2 (two) times daily at 8 am and 10 pm., Disp: 60 capsule, Rfl: 5  ROS  Constitutional: Denies any fever or chills Gastrointestinal: No reported hemesis, hematochezia, vomiting, or acute GI distress Musculoskeletal: Denies any acute onset joint swelling, redness, loss of ROM, or weakness Neurological: No reported episodes of acute onset apraxia, aphasia, dysarthria, agnosia, amnesia, paralysis, loss of coordination, or loss of consciousness  Allergies  Ms. Laningham has No Known Allergies.  PFSH  Drug: Ms. Welte   reports no history of drug use. Alcohol:  reports current alcohol use. Tobacco:  reports that she has been smoking cigarettes. She has been smoking about 1.00 pack per day. She has never used smokeless tobacco. Medical:  has a past medical history of Arthralgia of temporomandibular joint, CAD, multiple vessel, Carotid arterial disease ([place]), Depression, Diastolic dysfunction, Fatty liver disease, nonalcoholic (1025), HLD (hyperlipidemia), Labile hypertension, Obesity, PTSD (post-traumatic stress disorder), Tobacco abuse, and Type 2 diabetes mellitus [facility]) (January 2017). Surgical: Ms. Creedon  has a past surgical history that includes Cholecystectomy; Tonsillectomy; Cesarean section; Cardiac catheterization ([name], 04/18/2016); Cardiac catheterization ([name], 06/29/2016); Coronary artery bypass graft ([name], 07/06/2016); TEE without cardioversion ([name], 07/06/2016); Endarterectomy (Right, 07/06/2016); Cardiac catheterization ([name], 08/29/2016); ABDOMINAL AORTOGRAM W/LOWER EXTREMITY ([name], 10/15/2018); and PERIPHERAL VASCULAR INTERVENTION (Left, 10/15/2018). Family: family history includes Alcohol abuse in her father; Anxiety disorder in her sister; Diabetes in her father and mother; Drug abuse in her father; Heart disease in her father; Stroke in her sister. She was adopted.  Constitutional Exam  General appearance: Well nourished, well developed, and well hydrated. In no apparent acute distress Vitals:   01/19/19 0950  BP: (!) 198/117  Pulse: (!) 103  Temp: 98.3 F (36.8 C)  TempSrc: Oral  SpO2: 96%  Weight: 205 lb (93 kg)  Height: _0  (1.702 m)   BMI Assessment: Estimated body mass index is 32.11 kg/m as calculated from the following:   Height as of this encounter: _1  (1.702 m).   Weight as of this encounter: 205 lb (93 kg).  BMI interpretation table: BMI level Category Range association with higher incidence of chronic pain  <18 kg/m2 Underweight   18.5-24.9 kg/m2 Ideal body weight   25-29.9  kg/m2 Overweight Increased incidence by 20%  30-34.9 kg/m2 Obese (Class I) Increased incidence by 68%  35-39.9 kg/m2 Severe obesity (Class II) Increased incidence by 136%  >40 kg/m2 Extreme obesity (Class III) Increased incidence by 254%   Patient's current BMI Ideal Body weight  Body mass index is 32.11 kg/m. Ideal body weight: 61.6 kg (135 lb 12.9 oz) Adjusted ideal body weight: 74.2 kg (163 lb 7.7 oz)   BMI Readings from Last 4 Encounters:  01/19/19 32.11 kg/m  01/07/19 32.22 kg/m  12/25/18 33.20 kg/m  11/07/18 33.44 kg/m   Wt Readings from Last 4 Encounters:  01/19/19 205 lb (93 kg)  01/07/19 205 lb 11.2 oz (93.3 kg)  12/25/18 212 lb (96.2 kg)  11/07/18 213 lb 8 oz (96.8 kg)  Psych/Mental status: Alert, oriented x 3 (person, place, & time)       Eyes: PERLA Respiratory: No evidence of acute respiratory distress  Cervical Spine Area Exam  Skin & Axial Inspection: Tendernes to palpation w/ referred pain when applying pressure over the tragus, and styloid bone. Some tenderness over the GON on the right and temple area. Alignment: Symmetrical Functional ROM: Unrestricted ROM      Stability: No instability detected Muscle Tone/Strength: Functionally intact. No obvious neuro-muscular  anomalies detected. Sensory (Neurological): Unimpaired Palpation: No palpable anomalies              Upper Extremity (UE) Exam    Side: Right upper extremity  Side: Left upper extremity  Skin & Extremity Inspection: Skin color, temperature, and hair growth are WNL. No peripheral edema or cyanosis. No masses, redness, swelling, asymmetry, or associated skin lesions. No contractures.  Skin & Extremity Inspection: Skin color, temperature, and hair growth are WNL. No peripheral edema or cyanosis. No masses, redness, swelling, asymmetry, or associated skin lesions. No contractures.  Functional ROM: Unrestricted ROM          Functional ROM: Unrestricted ROM          Muscle Tone/Strength: Functionally  intact. No obvious neuro-muscular anomalies detected.  Muscle Tone/Strength: Functionally intact. No obvious neuro-muscular anomalies detected.  Sensory (Neurological): Unimpaired          Sensory (Neurological): Unimpaired          Palpation: No palpable anomalies              Palpation: No palpable anomalies              Provocative Test(s):  Phalen's test: deferred Tinel's test: deferred Apley's scratch test (touch opposite shoulder):  Action 1 (Across chest): deferred Action 2 (Overhead): deferred Action 3 (LB reach): deferred   Provocative Test(s):  Phalen's test: deferred Tinel's test: deferred Apley's scratch test (touch opposite shoulder):  Action 1 (Across chest): deferred Action 2 (Overhead): deferred Action 3 (LB reach): deferred    Thoracic Spine Area Exam  Skin & Axial Inspection: No masses, redness, or swelling Alignment: Symmetrical Functional ROM: Unrestricted ROM Stability: No instability detected Muscle Tone/Strength: Functionally intact. No obvious neuro-muscular anomalies detected. Sensory (Neurological): Unimpaired Muscle strength & Tone: No palpable anomalies  Lumbar Spine Area Exam  Skin & Axial Inspection: No masses, redness, or swelling Alignment: Symmetrical Functional ROM: Unrestricted ROM       Stability: No instability detected Muscle Tone/Strength: Functionally intact. No obvious neuro-muscular anomalies detected. Sensory (Neurological): Unimpaired Palpation: No palpable anomalies       Provocative Tests: Hyperextension/rotation test: deferred today       Lumbar quadrant test (Kemp's test): deferred today       Lateral bending test: deferred today       Patrick's Maneuver: deferred today                   FABER* test: deferred today                   S-I anterior distraction/compression test: deferred today         S-I lateral compression test: deferred today         S-I Thigh-thrust test: deferred today         S-I Gaenslen's test: deferred  today         *(Flexion, ABduction and External Rotation)  Gait & Posture Assessment  Ambulation: Unassisted Gait: Relatively normal for age and body habitus Posture: WNL   Lower Extremity Exam    Side: Right lower extremity  Side: Left lower extremity  Stability: No instability observed          Stability: No instability observed          Skin & Extremity Inspection: Skin color, temperature, and hair growth are WNL. No peripheral edema or cyanosis. No masses, redness, swelling, asymmetry, or associated skin lesions. No contractures.  Skin & Extremity Inspection: Skin color,  temperature, and hair growth are WNL. No peripheral edema or cyanosis. No masses, redness, swelling, asymmetry, or associated skin lesions. No contractures.  Functional ROM: Unrestricted ROM                  Functional ROM: Unrestricted ROM                  Muscle Tone/Strength: Functionally intact. No obvious neuro-muscular anomalies detected.  Muscle Tone/Strength: Functionally intact. No obvious neuro-muscular anomalies detected.  Sensory (Neurological): Unimpaired        Sensory (Neurological): Unimpaired        DTR: Patellar: deferred today Achilles: deferred today Plantar: deferred today  DTR: Patellar: deferred today Achilles: deferred today Plantar: deferred today  Palpation: No palpable anomalies  Palpation: No palpable anomalies   Assessment & Plan  Primary Diagnosis & Pertinent Problem List: The primary encounter diagnosis was Chronic pain syndrome. Diagnoses of Chronic headaches (Primary Area of Pain) (Right), Intractable chronic cluster headache, Disorder of skeletal system, Neurogenic pain, Pharmacologic therapy, Long term current use of opiate analgesic, Long term prescription benzodiazepine use, Chronic anticoagulation (PLAVIX), Problems influencing health status, Elevated C-reactive protein (CRP), Elevated sed rate, Tobacco abuse, Vitamin D deficiency, Coronary artery disease involving native coronary  artery of native heart with unstable angina pectoris ([place]), S/P CABG x 4, and Agoraphobia with panic attacks were also pertinent to this visit.  Visit Diagnosis: 1. Chronic pain syndrome   2. Chronic headaches (Primary Area of Pain) (Right)   3. Intractable chronic cluster headache   4. Disorder of skeletal system   5. Neurogenic pain   6. Pharmacologic therapy   7. Long term current use of opiate analgesic   8. Long term prescription benzodiazepine use   9. Chronic anticoagulation (PLAVIX)   10. Problems influencing health status   11. Elevated C-reactive protein (CRP)   12. Elevated sed rate   13. Tobacco abuse   14. Vitamin D deficiency   15. Coronary artery disease involving native coronary artery of native heart with unstable angina pectoris ([place])   16. S/P CABG x 4   17. Agoraphobia with panic attacks    Problems updated and reviewed during this visit: No problems updated.  Plan of Care  Pharmacotherapy (Medications Ordered): Meds ordered this encounter  Medications  . ergocalciferol (VITAMIN D2) 1.25 MG (50000 UT) capsule    Sig: Take 1 capsule (50,000 Units total) by mouth 2 (two) times a week. X 6 weeks.    Dispense:  12 capsule    Refill:  0    Do not add this medication to the electronic "Automatic Refill" notification system. Patient may have prescription filled one day early if pharmacy is closed on scheduled refill date.  . Cholecalciferol (VITAMIN D3) 125 MCG (5000 UT) CAPS    Sig: Take 1 capsule (5,000 Units total) by mouth daily with breakfast. Take along with calcium and magnesium.    Dispense:  30 capsule    Refill:  5    Do not place medication on "Automatic Refill".  May substitute with similar over-the-counter product.  . Magnesium 500 MG CAPS    Sig: Take 1 capsule (500 mg total) by mouth 2 (two) times daily at 8 am and 10 pm.    Dispense:  60 capsule    Refill:  5    Do not place medication on "Automatic Refill".  The patient may use similar  over-the-counter product.  . calcium carbonate (CALCIUM 600) 600 MG TABS tablet  Sig: Take 1 tablet (600 mg total) by mouth 2 (two) times daily with a meal for 30 days.    Dispense:  60 tablet    Refill:  0    Do not place medication on "Automatic Refill". Fill one day early if pharmacy is closed on scheduled refill date.  . gabapentin (NEURONTIN) 100 MG capsule    Sig: Take 1-3 capsules (100-300 mg total) by mouth at bedtime for 30 days. Follow written titration schedule.    Dispense:  90 capsule    Refill:  0    Do not place medication on "Automatic Refill". Fill one day early if pharmacy is closed on scheduled refill date.    Procedure Orders     FACIAL NERVE BLOCK  Lab Orders     Rheumatoid factor     ANA w/Reflex if Positive Imaging Orders  No imaging studies ordered today   Referral Orders  No referral(s) requested today   Pharmacological management options:  Opioid Analgesics: I will not be prescribing any opioids at this time Membrane stabilizer: We have discussed the possibility of optimizing this mode of therapy, if tolerated Muscle relaxant: We have discussed the possibility of a trial NSAID: We have discussed the possibility of a trial Other analgesic(s): To be determined at a later time   Interventional management options: Planned, scheduled, and/or pending:    Diagnostic right-sided facial nerve block #1 under fluoroscopic guidance and IV sedation   Considering:   Diagnostic right sided facial nerve block  Diagnostic right-sided occipital nerve block  Possible right-sided occipital nerve RFA  Diagnostic Botox injections    PRN Procedures:   None at this time   Provider-requested follow-up: Return for Procedure (w/ sedation): (R) Facial Nerve BLK #1.  Future Appointments  Date Time Provider [place]  01/27/2019  8:30 AM Milinda Pointer, MD [facility] None  01/28/2019 10:45 AM Venancio Poisson, NP [facility] None  02/10/2019  1:40 PM Wellington Hampshire, MD [id number]  02/18/2019  8:40 AM Arfeen, Arlyce Harman, MD [facility] None  03/30/2019  9:20 AM Lucille Passy, MD [facility]    Primary Care Physician: Lucille Passy, MD Location: [facility] Note by: Gaspar Cola, MD Date: 01/19/2019; Time: 11:14 AM

## 2019-01-19 ENCOUNTER — Telehealth: Payer: Self-pay | Admitting: *Deleted

## 2019-01-19 ENCOUNTER — Telehealth: Payer: Self-pay

## 2019-01-19 ENCOUNTER — Ambulatory Visit: Payer: BLUE CROSS/BLUE SHIELD | Attending: Pain Medicine | Admitting: Pain Medicine

## 2019-01-19 ENCOUNTER — Encounter: Payer: Self-pay | Admitting: Pain Medicine

## 2019-01-19 ENCOUNTER — Other Ambulatory Visit: Payer: Self-pay

## 2019-01-19 VITALS — BP 198/117 | HR 103 | Temp 98.3°F | Ht 67.0 in | Wt 205.0 lb

## 2019-01-19 DIAGNOSIS — G894 Chronic pain syndrome: Secondary | ICD-10-CM | POA: Diagnosis not present

## 2019-01-19 DIAGNOSIS — Z79891 Long term (current) use of opiate analgesic: Secondary | ICD-10-CM

## 2019-01-19 DIAGNOSIS — M792 Neuralgia and neuritis, unspecified: Secondary | ICD-10-CM | POA: Diagnosis not present

## 2019-01-19 DIAGNOSIS — R7982 Elevated C-reactive protein (CRP): Secondary | ICD-10-CM | POA: Diagnosis not present

## 2019-01-19 DIAGNOSIS — R700 Elevated erythrocyte sedimentation rate: Secondary | ICD-10-CM

## 2019-01-19 DIAGNOSIS — Z789 Other specified health status: Secondary | ICD-10-CM

## 2019-01-19 DIAGNOSIS — R51 Headache: Secondary | ICD-10-CM | POA: Diagnosis not present

## 2019-01-19 DIAGNOSIS — Z951 Presence of aortocoronary bypass graft: Secondary | ICD-10-CM | POA: Diagnosis not present

## 2019-01-19 DIAGNOSIS — G44021 Chronic cluster headache, intractable: Secondary | ICD-10-CM | POA: Diagnosis not present

## 2019-01-19 DIAGNOSIS — Z7901 Long term (current) use of anticoagulants: Secondary | ICD-10-CM | POA: Diagnosis not present

## 2019-01-19 DIAGNOSIS — I25110 Atherosclerotic heart disease of native coronary artery with unstable angina pectoris: Secondary | ICD-10-CM | POA: Diagnosis not present

## 2019-01-19 DIAGNOSIS — Z720 Tobacco use: Secondary | ICD-10-CM | POA: Diagnosis not present

## 2019-01-19 DIAGNOSIS — E559 Vitamin D deficiency, unspecified: Secondary | ICD-10-CM | POA: Diagnosis not present

## 2019-01-19 DIAGNOSIS — Z79899 Other long term (current) drug therapy: Secondary | ICD-10-CM

## 2019-01-19 DIAGNOSIS — F4001 Agoraphobia with panic disorder: Secondary | ICD-10-CM

## 2019-01-19 DIAGNOSIS — M899 Disorder of bone, unspecified: Secondary | ICD-10-CM | POA: Diagnosis not present

## 2019-01-19 DIAGNOSIS — E785 Hyperlipidemia, unspecified: Secondary | ICD-10-CM

## 2019-01-19 DIAGNOSIS — G8929 Other chronic pain: Secondary | ICD-10-CM

## 2019-01-19 MED ORDER — MAGNESIUM 500 MG PO CAPS: 500.0000 mg | ORAL_CAPSULE | Freq: Two times a day (BID) | ORAL | 5 refills | Status: DC

## 2019-01-19 MED ORDER — VITAMIN D3 125 MCG (5000 UT) PO CAPS: 1.0000 | ORAL_CAPSULE | Freq: Every day | ORAL | 5 refills | Status: DC

## 2019-01-19 MED ORDER — ERGOCALCIFEROL 1.25 MG (50000 UT) PO CAPS: 50000.0000 [IU] | ORAL_CAPSULE | ORAL | 0 refills | Status: DC

## 2019-01-19 MED ORDER — CALCIUM CARBONATE 600 MG PO TABS: 600.0000 mg | ORAL_TABLET | Freq: Two times a day (BID) | ORAL | 0 refills | Status: DC

## 2019-01-19 MED ORDER — GABAPENTIN 100 MG PO CAPS: 100.0000 mg | ORAL_CAPSULE | Freq: Every day | ORAL | 0 refills | Status: DC

## 2019-01-19 NOTE — Telephone Encounter (Signed)
Patient made aware of results and verbalized understanding.  The patient has been taking her Praluent 75 mg every 14 days.

## 2019-01-19 NOTE — Progress Notes (Signed)
Safety precautions to be maintained throughout the outpatient stay will include: orient to surroundings, keep bed in low position, maintain call bell within reach at all times, provide assistance with transfer out of bed and ambulation.  

## 2019-01-19 NOTE — Telephone Encounter (Signed)
-----   Message from Wellington Hampshire, MD sent at 01/15/2019  8:48 PM EST ----- Inform patient that labs were normal. Cholesterol was the same as before. IS she taking Praluent?

## 2019-01-19 NOTE — Patient Instructions (Addendum)
______________________________________________________________________________________________  Specialty Pain Scale  Introduction:  There are significant differences in how pain is reported. The word pain usually refers to physical pain, but it is also a common synonym of suffering. The medical community uses a scale from 0 (zero) to 10 (ten) to report pain level. Zero (0) is described as "no pain", while ten (10) is described as "the worse pain you can imagine". The problem with this scale is that physical pain is reported along with suffering. Suffering refers to mental pain, or more often yet it refers to any unpleasant feeling, emotion or aversion associated with the perception of harm or threat of harm. It is the psychological component of pain.  Pain Specialists prefer to separate the two components. The pain scale used by this practice is the Verbal Numerical Rating Scale (VNRS-11). This scale is for the physical pain only. DO NOT INCLUDE how your pain psychologically affects you. This scale is for adults 21 years of age and older. It has 11 (eleven) levels. The 1st level is 0/10. This means: "right now, I have no pain". In the context of pain management, it also means: "right now, my physical pain is under control with the current therapy".  General Information:  The scale should reflect your current level of pain. Unless you are specifically asked for the level of your worst pain, or your average pain. If you are asked for one of these two, then it should be understood that it is over the past 24 hours.  Levels 1 (one) through 5 (five) are described below, and can be treated as an outpatient. Ambulatory pain management facilities such as ours are more than adequate to treat these levels. Levels 6 (six) through 10 (ten) are also described below, however, these must be treated as a hospitalized patient. While levels 6 (six) and 7 (seven) may be evaluated at an urgent care facility, levels 8  (eight) through 10 (ten) constitute medical emergencies and as such, they belong in a hospital's emergency department. When having these levels (as described below), do not come to our office. Our facility is not equipped to manage these levels. Go directly to an urgent care facility or an emergency department to be evaluated.  Definitions:  Activities of Daily Living (ADL): Activities of daily living (ADL or ADLs) is a term used in healthcare to refer to people's daily self-care activities. Health professionals often use a person's ability or inability to perform ADLs as a measurement of their functional status, particularly in regard to people post injury, with disabilities and the elderly. There are two ADL levels: Basic and Instrumental. Basic Activities of Daily Living (BADL  or BADLs) consist of self-care tasks that include: Bathing and showering; personal hygiene and grooming (including brushing/combing/styling hair); dressing; Toilet hygiene (getting to the toilet, cleaning oneself, and getting back up); eating and self-feeding (not including cooking or chewing and swallowing); functional mobility, often referred to as "transferring", as measured by the ability to walk, get in and out of bed, and get into and out of a chair; the broader definition (moving from one place to another while performing activities) is useful for people with different physical abilities who are still able to get around independently. Basic ADLs include the things many people do when they get up in the morning and get ready to go out of the house: get out of bed, go to the toilet, bathe, dress, groom, and eat. On the average, loss of function typically follows a particular order.   Hygiene is the first to go, followed by loss of toilet use and locomotion. The last to go is the ability to eat. When there is only one remaining area in which the person is independent, there is a 62.9% chance that it is eating and only a 3.5% chance  that it is hygiene. Instrumental Activities of Daily Living (IADL or IADLs) are not necessary for fundamental functioning, but they let an individual live independently in a community. IADL consist of tasks that include: cleaning and maintaining the house; home establishment and maintenance; care of others (including selecting and supervising caregivers); care of pets; child rearing; managing money; managing financials (investments, etc.); meal preparation and cleanup; shopping for groceries and necessities; moving within the community; safety procedures and emergency responses; health management and maintenance (taking prescribed medications); and using the telephone or other form of communication.  Instructions:  Most patients tend to report their pain as a combination of two factors, their physical pain and their psychosocial pain. This last one is also known as "suffering" and it is reflection of how physical pain affects you socially and psychologically. From now on, report them separately.  From this point on, when asked to report your pain level, report only your physical pain. Use the following table for reference.  Pain Clinic Pain Levels (0-5/10)  Pain Level Score  Description  No Pain 0   Mild pain 1 Nagging, annoying, but does not interfere with basic activities of daily living (ADL). Patients are able to eat, bathe, get dressed, toileting (being able to get on and off the toilet and perform personal hygiene functions), transfer (move in and out of bed or a chair without assistance), and maintain continence (able to control bladder and bowel functions). Blood pressure and heart rate are unaffected. A normal heart rate for a healthy adult ranges from 60 to 100 bpm (beats per minute).   Mild to moderate pain 2 Noticeable and distracting. Impossible to hide from other people. More frequent flare-ups. Still possible to adapt and function close to normal. It can be very annoying and may have  occasional stronger flare-ups. With discipline, patients may get used to it and adapt.   Moderate pain 3 Interferes significantly with activities of daily living (ADL). It becomes difficult to feed, bathe, get dressed, get on and off the toilet or to perform personal hygiene functions. Difficult to get in and out of bed or a chair without assistance. Very distracting. With effort, it can be ignored when deeply involved in activities.   Moderately severe pain 4 Impossible to ignore for more than a few minutes. With effort, patients may still be able to manage work or participate in some social activities. Very difficult to concentrate. Signs of autonomic nervous system discharge are evident: dilated pupils (mydriasis); mild sweating (diaphoresis); sleep interference. Heart rate becomes elevated (>115 bpm). Diastolic blood pressure (lower number) rises above 100 mmHg. Patients find relief in laying down and not moving.   Severe pain 5 Intense and extremely unpleasant. Associated with frowning face and frequent crying. Pain overwhelms the senses.  Ability to do any activity or maintain social relationships becomes significantly limited. Conversation becomes difficult. Pacing back and forth is common, as getting into a comfortable position is nearly impossible. Pain wakes you up from deep sleep. Physical signs will be obvious: pupillary dilation; increased sweating; goosebumps; brisk reflexes; cold, clammy hands and feet; nausea, vomiting or dry heaves; loss of appetite; significant sleep disturbance with inability to fall asleep or to   remain asleep. When persistent, significant weight loss is observed due to the complete loss of appetite and sleep deprivation.  Blood pressure and heart rate becomes significantly elevated. Caution: If elevated blood pressure triggers a pounding headache associated with blurred vision, then the patient should immediately seek attention at an urgent or emergency care unit, as  these may be signs of an impending stroke.    Emergency Department Pain Levels (6-10/10)  Emergency Room Pain 6 Severely limiting. Requires emergency care and should not be seen or managed at an outpatient pain management facility. Communication becomes difficult and requires great effort. Assistance to reach the emergency department may be required. Facial flushing and profuse sweating along with potentially dangerous increases in heart rate and blood pressure will be evident.   Distressing pain 7 Self-care is very difficult. Assistance is required to transport, or use restroom. Assistance to reach the emergency department will be required. Tasks requiring coordination, such as bathing and getting dressed become very difficult.   Disabling pain 8 Self-care is no longer possible. At this level, pain is disabling. The individual is unable to do even the most "basic" activities such as walking, eating, bathing, dressing, transferring to a bed, or toileting. Fine motor skills are lost. It is difficult to think clearly.   Incapacitating pain 9 Pain becomes incapacitating. Thought processing is no longer possible. Difficult to remember your own name. Control of movement and coordination are lost.   The worst pain imaginable 10 At this level, most patients pass out from pain. When this level is reached, collapse of the autonomic nervous system occurs, leading to a sudden drop in blood pressure and heart rate. This in turn results in a temporary and dramatic drop in blood flow to the brain, leading to a loss of consciousness. Fainting is one of the body's self defense mechanisms. Passing out puts the brain in a calmed state and causes it to shut down for a while, in order to begin the healing process.    Summary: 1. Refer to this scale when providing [name] with your pain level. 2. Be accurate and careful when reporting your pain level. This will help with your care. 3. Over-reporting your pain level will  lead to loss of credibility. 4. Even a level of 1/10 means that there is pain and will be treated at our facility. 5. High, inaccurate reporting will be documented as "Symptom Exaggeration", leading to loss of credibility and suspicions of possible secondary gains such as obtaining more narcotics, or wanting to appear disabled, for fraudulent reasons. 6. Only pain levels of 5 or below will be seen at our facility. 7. Pain levels of 6 and above will be sent to the Emergency Department and the appointment cancelled. ______________________________________________________________________________________________   ____________________________________________________________________________________________  Drug Holidays (Slow)  What is a "Drug Holiday"? Drug Holiday: is the name given to the period of time during which a patient stops taking a medication(s) for the purpose of eliminating tolerance to the drug.  Benefits . Improved effectiveness of opioids. . Decreased opioid dose needed to achieve benefits. . Improved pain with lesser dose.  What is tolerance? Tolerance: is the progressive decreased in effectiveness of a drug due to its repetitive use. With repetitive use, the body gets use to the medication and as a consequence, it loses its effectiveness. This is a common problem seen with opioid pain medications. As a result, a larger dose of the drug is needed to achieve the same effect that used to be obtained  with a smaller dose.  How long should a "Drug Holiday" last? At least 14 consecutive days. (2 weeks)  What are withdrawals? Withdrawals: refers to the wide range of symptoms that occur after stopping or dramatically reducing opiate drugs after heavy and prolonged use. Withdrawal symptoms do not occur to patients that use low dose opioids, or those who take the medication sporadically. Contrary to benzodiazepine (example: Valium, Xanax, etc.) or alcohol withdrawals ("Delirium  Tremens"), opioid withdrawals are not lethal. Withdrawals are the physical manifestation of the body getting rid of the excess receptors.  Expected Symptoms Early symptoms of withdrawal may include: . Agitation . Anxiety . Muscle aches . Increased tearing . Insomnia . Runny nose . Sweating . Yawning  Late symptoms of withdrawal may include: . Abdominal cramping . Diarrhea . Dilated pupils . Goose bumps . Nausea . Vomiting  Will I experience withdrawals? Due to the slow nature of the taper, it is very unlikely that you will experience any.  What is a slow taper? Taper: refers to the gradual decrease in dose. ___________________________________________________________________________________________   ____________________________________________________________________________________________  Initial Gabapentin Titration  Medication used: Gabapentin (Generic Name) or Neurontin (Brand Name) 100 mg tablets/capsules  Reasons to stop increasing the dose:  Reason 1: You get good relief of symptoms, in which case there is no need to increase the daily dose any further.    Reason 2: You develop some side effects, such as sleeping all of the time, difficulty concentrating, or becoming disoriented, in which case you need to go down on the dose, to the prior level, where you were not experiencing any side effects. Stay on that dose longer, to allow more time for your body to get use it, before attempting to increase it again.   Steps: Step 1: Start by taking 1 (one) tablet at bedtime x 7 (seven) days.  Step 2: After being on 1 (one) tablet for 7 (seven) days, then increase it to 2 (two) tablets at bedtime for another 7 (seven) days.  Step 3: Next, after being on 2 (two) tablets at bedtime for 7 (seven) days, then increase it to 3 (three) tablets at bedtime, and stay on that dose until you see your doctor.  Reasons to stop increasing the dose: Reason 1: You get good relief of  symptoms, in which case there is no need to increase the daily dose any further.  Reason 2: You develop some side effects, such as sleeping all of the time, difficulty concentrating, or becoming disoriented, in which case you need to go down on the dose, to the prior level, where you were not experiencing any side effects. Stay on that dose longer, to allow more time for your body to get use it, before attempting to increase it again.  Endpoint: Once you have reached the maximum dose you can tolerate without side-effects, contact your physician so as to evaluate the results of the regimen.   Questions: Feel free to contact us for any questions or problems at [phone] ____________________________________________________________________________________________   ____________________________________________________________________________________________  Medication Rules  Purpose: To inform patients, and their family members, of our rules and regulations.  Applies to: All patients receiving prescriptions (written or electronic).  Pharmacy of record: Pharmacy where electronic prescriptions will be sent. If written prescriptions are taken to a different pharmacy, please inform the nursing staff. The pharmacy listed in the electronic medical record should be the one where you would like electronic prescriptions to be sent.  Electronic prescriptions: In compliance with the  [facility] Strengthen Opioid Misuse Prevention (STOP) Act of 2017 (Session Lanny Cramp [phone]), effective December 24, 2018, all controlled substances must be electronically prescribed. Calling prescriptions to the pharmacy will cease to exist.  Prescription refills: Only during scheduled appointments. Applies to all prescriptions.  NOTE: The following applies primarily to controlled substances (Opioid* Pain Medications).   Patient's responsibilities: 1. Pain Pills: Bring all pain pills to every appointment (except for  procedure appointments). 2. Pill Bottles: Bring pills in original pharmacy bottle. Always bring the newest bottle. Bring bottle, even if empty. 3. Medication refills: You are responsible for knowing and keeping track of what medications you take and those you need refilled. The day before your appointment: write a list of all prescriptions that need to be refilled. The day of the appointment: give the list to the admitting nurse. Prescriptions will be written only during appointments. If you forget a medication: it will not be "Called in", "Faxed", or "electronically sent". You will need to get another appointment to get these prescribed. No early refills. Do not call asking to have your prescription filled early. 4. Prescription Accuracy: You are responsible for carefully inspecting your prescriptions before leaving our office. Have the discharge nurse carefully go over each prescription with you, before taking them home. Make sure that your name is accurately spelled, that your address is correct. Check the name and dose of your medication to make sure it is accurate. Check the number of pills, and the written instructions to make sure they are clear and accurate. Make sure that you are given enough medication to last until your next medication refill appointment. 5. Taking Medication: Take medication as prescribed. When it comes to controlled substances, taking less pills or less frequently than prescribed is permitted and encouraged. Never take more pills than instructed. Never take medication more frequently than prescribed.  6. Inform other Doctors: Always inform, all of your healthcare providers, of all the medications you take. 7. Pain Medication from other Providers: You are not allowed to accept any additional pain medication from any other Doctor or Healthcare provider. There are two exceptions to this rule. (see below) In the event that you require additional pain medication, you are  responsible for notifying us, as stated below. 8. Medication Agreement: You are responsible for carefully reading and following our Medication Agreement. This must be signed before receiving any prescriptions from our practice. Safely store a copy of your signed Agreement. Violations to the Agreement will result in no further prescriptions. (Additional copies of our Medication Agreement are available upon request.) 9. Laws, Rules, & Regulations: All patients are expected to follow all Federal and [employer], Rules, [employer]. Ignorance of the Laws does not constitute a valid excuse. The use of any illegal substances is prohibited. 10. Adopted CDC guidelines & recommendations: Target dosing levels will be at or below 60 MME/day. Use of benzodiazepines** is not recommended.  Exceptions: There are only two exceptions to the rule of not receiving pain medications from other Healthcare Providers. 1. Exception #1 (Emergencies): In the event of an emergency (i.e.: accident requiring emergency care), you are allowed to receive additional pain medication. However, you are responsible for: As soon as you are able, call our office [phone], at any time of the day or night, and leave a message stating your name, the date and nature of the emergency, and the name and dose of the medication prescribed. In the event that your call is answered by a member of  our staff, make sure to document and save the date, time, and the name of the person that took your information.  2. Exception #2 (Planned Surgery): In the event that you are scheduled by another doctor or dentist to have any type of surgery or procedure, you are allowed (for a period no longer than 30 days), to receive additional pain medication, for the acute post-op pain. However, in this case, you are responsible for picking up a copy of our "Post-op Pain Management for Surgeons" handout, and giving it to your surgeon or dentist. This document is  available at our office, and does not require an appointment to obtain it. Simply go to our office during business hours (Monday-Thursday from 8:00 AM to 4:00 PM) (Friday 8:00 AM to 12:00 Noon) or if you have a scheduled appointment with [name], prior to your surgery, and ask for it by name. In addition, you will need to provide [name] with your name, name of your surgeon, type of surgery, and date of procedure or surgery.  *Opioid medications include: morphine, codeine, oxycodone, oxymorphone, hydrocodone, hydromorphone, meperidine, tramadol, tapentadol, buprenorphine, fentanyl, methadone. **Benzodiazepine medications include: diazepam (Valium), alprazolam (Xanax), clonazepam (Klonopine), lorazepam (Ativan), clorazepate (Tranxene), chlordiazepoxide (Librium), estazolam (Prosom), oxazepam (Serax), temazepam (Restoril), triazolam (Halcion) (Last updated: 02/20/2018) ____________________________________________________________________________________________   ____________________________________________________________________________________________  Medication Recommendations and Reminders  Applies to: All patients receiving prescriptions (written and/or electronic).  Medication Rules & Regulations: These rules and regulations exist for your safety and that of others. They are not flexible and neither are we. Dismissing or ignoring them will be considered "non-compliance" with medication therapy, resulting in complete and irreversible termination of such therapy. (See document titled "Medication Rules" for more details.) In all conscience, because of safety reasons, we cannot continue providing a therapy where the patient does not follow instructions.  Pharmacy of record:   Definition: This is the pharmacy where your electronic prescriptions will be sent.   We do not endorse any particular pharmacy.  You are not restricted in your choice of pharmacy.  The pharmacy listed in the electronic medical  record should be the one where you want electronic prescriptions to be sent.  If you choose to change pharmacy, simply notify our nursing staff of your choice of new pharmacy.  Recommendations:  Keep all of your pain medications in a safe place, under lock and key, even if you live alone.   After you fill your prescription, take 1 week's worth of pills and put them away in a safe place. You should keep a separate, properly labeled bottle for this purpose. The remainder should be kept in the original bottle. Use this as your primary supply, until it runs out. Once it's gone, then you know that you have 1 week's worth of medicine, and it is time to come in for a prescription refill. If you do this correctly, it is unlikely that you will ever run out of medicine.  To make sure that the above recommendation works, it is very important that you make sure your medication refill appointments are scheduled at least 1 week before you run out of medicine. To do this in an effective manner, make sure that you do not leave the office without scheduling your next medication management appointment. Always ask the nursing staff to show you in your prescription , when your medication will be running out. Then arrange for the receptionist to get you a return appointment, at least 7 days before you run out of medicine. Do not  wait until you have 1 or 2 pills left, to come in. This is very poor planning and does not take into consideration that we may need to cancel appointments due to bad weather, sickness, or emergencies affecting our staff.  "Partial Fill": If for any reason your pharmacy does not have enough pills/tablets to completely fill or refill your prescription, do not allow for a "partial fill". You will need a separate prescription to fill the remaining amount, which we will not provide. If the reason for the partial fill is your insurance, you will need to talk to the pharmacist about payment alternatives for  the remaining tablets, but again, do not accept a partial fill.  Prescription refills and/or changes in medication(s):   Prescription refills, and/or changes in dose or medication, will be conducted only during scheduled medication management appointments. (Applies to both, written and electronic prescriptions.)  No refills on procedure days. No medication will be changed or started on procedure days. No changes, adjustments, and/or refills will be conducted on a procedure day. Doing so will interfere with the diagnostic portion of the procedure.  No phone refills. No medications will be "called into the pharmacy".  No Fax refills.  No weekend refills.  No Holliday refills.  No after hours refills.  Remember:  Business hours are:  Monday to Thursday 8:00 AM to 4:00 PM Provider's Schedule: Dionisio David, NP - Appointments are:  Medication management: Monday to Thursday 8:00 AM to 4:00 PM Milinda Pointer, MD - Appointments are:  Medication management: Monday and Wednesday 8:00 AM to 4:00 PM Procedure day: Tuesday and Thursday 7:30 AM to 4:00 PM Gillis Santa, MD - Appointments are:  Medication management: Tuesday and Thursday 8:00 AM to 4:00 PM Procedure day: Monday and Wednesday 7:30 AM to 4:00 PM (Last update: 02/20/2018) ____________________________________________________________________________________________   ____________________________________________________________________________________________  CANNABIDIOL (AKA: CBD Oil or Pills)  Applies to: All patients receiving prescriptions of controlled substances (written and/or electronic).  General Information: Cannabidiol (CBD) was discovered in 80. It is one of some 113 identified cannabinoids in cannabis (Marijuana) plants, accounting for up to 40% of the plant's extract. As of 2018, preliminary clinical research on cannabidiol included studies of anxiety, cognition, movement disorders, and pain.  Cannabidiol is  consummed in multiple ways, including inhalation of cannabis smoke or vapor, as an aerosol spray into the cheek, and by mouth. It may be supplied as CBD oil containing CBD as the active ingredient (no added tetrahydrocannabinol (THC) or terpenes), a full-plant CBD-dominant hemp extract oil, capsules, dried cannabis, or as a liquid solution. CBD is thought not have the same psychoactivity as THC, and may affect the actions of THC. Studies suggest that CBD may interact with different biological targets, including cannabinoid receptors and other neurotransmitter receptors. As of 2018 the mechanism of action for its biological effects has not been determined.  In the [place], cannabidiol has a limited approval by the Food and Drug Administration (FDA) for treatment of only two types of epilepsy disorders. The side effects of long-term use of the drug include somnolence, decreased appetite, diarrhea, fatigue, malaise, weakness, sleeping problems, and others.  CBD remains a Schedule I drug prohibited for any use.  Legality: Some manufacturers ship CBD products nationally, an illegal action which the FDA has not enforced in 2018, with CBD remaining the subject of an FDA investigational new drug evaluation, and is not considered legal as a dietary supplement or food ingredient as of December 2018. Federal illegality has made it difficult historically  to conduct research on CBD. CBD is openly sold in head shops and health food stores in some states where such sales have not been explicitly legalized.  Warning: Because it is not FDA approved for general use or treatment of pain, it is not required to undergo the same manufacturing controls as prescription drugs.  This means that the available cannabidiol (CBD) may be contaminated with THC.  If this is the case, it will trigger a positive urine drug screen (UDS) test for cannabinoids (Marijuana).  Because a positive UDS for illicit substances is a violation of  our medication agreement, your opioid analgesics (pain medicine) may be permanently discontinued. (Last update: 03/13/2018) ____________________________________________________________________________________________   ____________________________________________________________________________________________  General Risks and Possible Complications  Patient Responsibilities: It is important that you read this as it is part of your informed consent. It is our duty to inform you of the risks and possible complications associated with treatments offered to you. It is your responsibility as a patient to read this and to ask questions about anything that is not clear or that you believe was not covered in this document.  Patient's Rights: You have the right to refuse treatment. You also have the right to change your mind, even after initially having agreed to have the treatment done. However, under this last option, if you wait until the last second to change your mind, you may be charged for the materials used up to that point.  Introduction: Medicine is not an Chief Strategy Officer. Everything in Medicine, including the lack of treatment(s), carries the potential for danger, harm, or loss (which is by definition: Risk). In Medicine, a complication is a secondary problem, condition, or disease that can aggravate an already existing one. All treatments carry the risk of possible complications. The fact that a side effects or complications occurs, does not imply that the treatment was conducted incorrectly. It must be clearly understood that these can happen even when everything is done following the highest safety standards.  No treatment: You can choose not to proceed with the proposed treatment alternative. The "PRO(s)" would include: avoiding the risk of complications associated with the therapy. The "CON(s)" would include: not getting any of the treatment benefits. These benefits fall under one of three  categories: diagnostic; therapeutic; and/or palliative. Diagnostic benefits include: getting information which can ultimately lead to improvement of the disease or symptom(s). Therapeutic benefits are those associated with the successful treatment of the disease. Finally, palliative benefits are those related to the decrease of the primary symptoms, without necessarily curing the condition (example: decreasing the pain from a flare-up of a chronic condition, such as incurable terminal cancer).  General Risks and Complications: These are associated to most interventional treatments. They can occur alone, or in combination. They fall under one of the following six (6) categories: no benefit or worsening of symptoms; bleeding; infection; nerve damage; allergic reactions; and/or death. 1. No benefits or worsening of symptoms: In Medicine there are no guarantees, only probabilities. No healthcare provider can ever guarantee that a medical treatment will work, they can only state the probability that it may. Furthermore, there is always the possibility that the condition may worsen, either directly, or indirectly, as a consequence of the treatment. 2. Bleeding: This is more common if the patient is taking a blood thinner, either prescription or over the counter (example: Goody Powders, Fish oil, Aspirin, Garlic, etc.), or if suffering a condition associated with impaired coagulation (example: Hemophilia, cirrhosis of the liver, low platelet counts,  etc.). However, even if you do not have one on these, it can still happen. If you have any of these conditions, or take one of these drugs, make sure to notify your treating physician. 3. Infection: This is more common in patients with a compromised immune system, either due to disease (example: diabetes, cancer, human immunodeficiency virus [HIV], etc.), or due to medications or treatments (example: therapies used to treat cancer and rheumatological diseases). However,  even if you do not have one on these, it can still happen. If you have any of these conditions, or take one of these drugs, make sure to notify your treating physician. 4. Nerve Damage: This is more common when the treatment is an invasive one, but it can also happen with the use of medications, such as those used in the treatment of cancer. The damage can occur to small secondary nerves, or to large primary ones, such as those in the spinal cord and brain. This damage may be temporary or permanent and it may lead to impairments that can range from temporary numbness to permanent paralysis and/or brain death. 5. Allergic Reactions: Any time a substance or material comes in contact with our body, there is the possibility of an allergic reaction. These can range from a mild skin rash (contact dermatitis) to a severe systemic reaction (anaphylactic reaction), which can result in death. 6. Death: In general, any medical intervention can result in death, most of the time due to an unforeseen complication. ____________________________________________________________________________________________  ____________________________________________________________________________________________  Preparing for Procedure with Sedation  Instructions: . Oral Intake: Do not eat or drink anything for at least 8 hours prior to your procedure. . Transportation: Public transportation is not allowed. Bring an adult driver. The driver must be physically present in our waiting room before any procedure can be started. Marland Kitchen Physical Assistance: Bring an adult physically capable of assisting you, in the event you need help. This adult should keep you company at home for at least 6 hours after the procedure. . Blood Pressure Medicine: Take your blood pressure medicine with a sip of water the morning of the procedure. . Blood thinners: Notify our staff if you are taking any blood thinners. Depending on which one you take, there  will be specific instructions on how and when to stop it. . Diabetics on insulin: Notify the staff so that you can be scheduled 1st case in the morning. If your diabetes requires high dose insulin, take only  of your normal insulin dose the morning of the procedure and notify the staff that you have done so. . Preventing infections: Shower with an antibacterial soap the morning of your procedure. . Build-up your immune system: Take 1000 mg of Vitamin C with every meal (3 times a day) the day prior to your procedure. Marland Kitchen Antibiotics: Inform the staff if you have a condition or reason that requires you to take antibiotics before dental procedures. . Pregnancy: If you are pregnant, call and cancel the procedure. . Sickness: If you have a cold, fever, or any active infections, call and cancel the procedure. . Arrival: You must be in the facility at least 30 minutes prior to your scheduled procedure. . Children: Do not bring children with you. . Dress appropriately: Bring dark clothing that you would not mind if they get stained. . Valuables: Do not bring any jewelry or valuables.  Procedure appointments are reserved for interventional treatments only. Marland Kitchen No Prescription Refills. . No medication changes will be discussed during procedure appointments. Marland Kitchen  No disability issues will be discussed.  Reasons to call and reschedule or cancel your procedure: (Following these recommendations will minimize the risk of a serious complication.) . Surgeries: Avoid having procedures within 2 weeks of any surgery. (Avoid for 2 weeks before or after any surgery). . Flu Shots: Avoid having procedures within 2 weeks of a flu shots or . (Avoid for 2 weeks before or after immunizations). . Barium: Avoid having a procedure within 7-10 days after having had a radiological study involving the use of radiological contrast. (Myelograms, Barium swallow or enema study). . Heart attacks: Avoid any elective procedures or surgeries  for the initial 6 months after a "Myocardial Infarction" (Heart Attack). . Blood thinners: It is imperative that you stop these medications before procedures. Let us know if you if you take any blood thinner.  . Infection: Avoid procedures during or within two weeks of an infection (including chest colds or gastrointestinal problems). Symptoms associated with infections include: Localized redness, fever, chills, night sweats or profuse sweating, burning sensation when voiding, cough, congestion, stuffiness, runny nose, sore throat, diarrhea, nausea, vomiting, cold or Flu symptoms, recent or current infections. It is specially important if the infection is over the area that we intend to treat. Marland Kitchen Heart and lung problems: Symptoms that may suggest an active cardiopulmonary problem include: cough, chest pain, breathing difficulties or shortness of breath, dizziness, ankle swelling, uncontrolled high or unusually low blood pressure, and/or palpitations. If you are experiencing any of these symptoms, cancel your procedure and contact your primary care physician for an evaluation.  Remember:  Regular Business hours are:  Monday to Thursday 8:00 AM to 4:00 PM  Provider's Schedule: Milinda Pointer, MD:  Procedure days: Tuesday and Thursday 7:30 AM to 4:00 PM  Gillis Santa, MD:  Procedure days: Monday and Wednesday 7:30 AM to 4:00 PM ____________________________________________________________________________________________   ____________________________________________________________________________________________  Preparing for Procedure with Sedation  Instructions: . Oral Intake: Do not eat or drink anything for at least 8 hours prior to your procedure. . Transportation: Public transportation is not allowed. Bring an adult driver. The driver must be physically present in our waiting room before any procedure can be started. Marland Kitchen Physical Assistance: Bring an adult physically capable of assisting  you, in the event you need help. This adult should keep you company at home for at least 6 hours after the procedure. . Blood Pressure Medicine: Take your blood pressure medicine with a sip of water the morning of the procedure. . Blood thinners: Notify our staff if you are taking any blood thinners. Depending on which one you take, there will be specific instructions on how and when to stop it. . Diabetics on insulin: Notify the staff so that you can be scheduled 1st case in the morning. If your diabetes requires high dose insulin, take only  of your normal insulin dose the morning of the procedure and notify the staff that you have done so. . Preventing infections: Shower with an antibacterial soap the morning of your procedure. . Build-up your immune system: Take 1000 mg of Vitamin C with every meal (3 times a day) the day prior to your procedure. Marland Kitchen Antibiotics: Inform the staff if you have a condition or reason that requires you to take antibiotics before dental procedures. . Pregnancy: If you are pregnant, call and cancel the procedure. . Sickness: If you have a cold, fever, or any active infections, call and cancel the procedure. . Arrival: You must be in the facility at least  30 minutes prior to your scheduled procedure. . Children: Do not bring children with you. . Dress appropriately: Bring dark clothing that you would not mind if they get stained. . Valuables: Do not bring any jewelry or valuables.  Procedure appointments are reserved for interventional treatments only. Marland Kitchen No Prescription Refills. . No medication changes will be discussed during procedure appointments. . No disability issues will be discussed.  Reasons to call and reschedule or cancel your procedure: (Following these recommendations will minimize the risk of a serious complication.) . Surgeries: Avoid having procedures within 2 weeks of any surgery. (Avoid for 2 weeks before or after any surgery). . Flu Shots: Avoid  having procedures within 2 weeks of a flu shots or . (Avoid for 2 weeks before or after immunizations). . Barium: Avoid having a procedure within 7-10 days after having had a radiological study involving the use of radiological contrast. (Myelograms, Barium swallow or enema study). . Heart attacks: Avoid any elective procedures or surgeries for the initial 6 months after a "Myocardial Infarction" (Heart Attack). . Blood thinners: It is imperative that you stop these medications before procedures. Let us know if you if you take any blood thinner.  . Infection: Avoid procedures during or within two weeks of an infection (including chest colds or gastrointestinal problems). Symptoms associated with infections include: Localized redness, fever, chills, night sweats or profuse sweating, burning sensation when voiding, cough, congestion, stuffiness, runny nose, sore throat, diarrhea, nausea, vomiting, cold or Flu symptoms, recent or current infections. It is specially important if the infection is over the area that we intend to treat. Marland Kitchen Heart and lung problems: Symptoms that may suggest an active cardiopulmonary problem include: cough, chest pain, breathing difficulties or shortness of breath, dizziness, ankle swelling, uncontrolled high or unusually low blood pressure, and/or palpitations. If you are experiencing any of these symptoms, cancel your procedure and contact your primary care physician for an evaluation.  Remember:  Regular Business hours are:  Monday to Thursday 8:00 AM to 4:00 PM  Provider's Schedule: Milinda Pointer, MD:  Procedure days: Tuesday and Thursday 7:30 AM to 4:00 PM  Gillis Santa, MD:  Procedure days: Monday and Wednesday 7:30 AM to 4:00 PM ____________________________________________________________________________________________  ____________________________________________________________________________________________  Preparing for Procedure with  Sedation  Instructions: . Oral Intake: Do not eat or drink anything for at least 8 hours prior to your procedure. . Transportation: Public transportation is not allowed. Bring an adult driver. The driver must be physically present in our waiting room before any procedure can be started. Marland Kitchen Physical Assistance: Bring an adult physically capable of assisting you, in the event you need help. This adult should keep you company at home for at least 6 hours after the procedure. . Blood Pressure Medicine: Take your blood pressure medicine with a sip of water the morning of the procedure. . Blood thinners: Notify our staff if you are taking any blood thinners. Depending on which one you take, there will be specific instructions on how and when to stop it. . Diabetics on insulin: Notify the staff so that you can be scheduled 1st case in the morning. If your diabetes requires high dose insulin, take only  of your normal insulin dose the morning of the procedure and notify the staff that you have done so. . Preventing infections: Shower with an antibacterial soap the morning of your procedure. . Build-up your immune system: Take 1000 mg of Vitamin C with every meal (3 times a day) the day prior  to your procedure. Marland Kitchen Antibiotics: Inform the staff if you have a condition or reason that requires you to take antibiotics before dental procedures. . Pregnancy: If you are pregnant, call and cancel the procedure. . Sickness: If you have a cold, fever, or any active infections, call and cancel the procedure. . Arrival: You must be in the facility at least 30 minutes prior to your scheduled procedure. . Children: Do not bring children with you. . Dress appropriately: Bring dark clothing that you would not mind if they get stained. . Valuables: Do not bring any jewelry or valuables.  Procedure appointments are reserved for interventional treatments only. Marland Kitchen No Prescription Refills. . No medication changes will be  discussed during procedure appointments. . No disability issues will be discussed.  Reasons to call and reschedule or cancel your procedure: (Following these recommendations will minimize the risk of a serious complication.) . Surgeries: Avoid having procedures within 2 weeks of any surgery. (Avoid for 2 weeks before or after any surgery). . Flu Shots: Avoid having procedures within 2 weeks of a flu shots or . (Avoid for 2 weeks before or after immunizations). . Barium: Avoid having a procedure within 7-10 days after having had a radiological study involving the use of radiological contrast. (Myelograms, Barium swallow or enema study). . Heart attacks: Avoid any elective procedures or surgeries for the initial 6 months after a "Myocardial Infarction" (Heart Attack). . Blood thinners: It is imperative that you stop these medications before procedures. Let us know if you if you take any blood thinner.  . Infection: Avoid procedures during or within two weeks of an infection (including chest colds or gastrointestinal problems). Symptoms associated with infections include: Localized redness, fever, chills, night sweats or profuse sweating, burning sensation when voiding, cough, congestion, stuffiness, runny nose, sore throat, diarrhea, nausea, vomiting, cold or Flu symptoms, recent or current infections. It is specially important if the infection is over the area that we intend to treat. Marland Kitchen Heart and lung problems: Symptoms that may suggest an active cardiopulmonary problem include: cough, chest pain, breathing difficulties or shortness of breath, dizziness, ankle swelling, uncontrolled high or unusually low blood pressure, and/or palpitations. If you are experiencing any of these symptoms, cancel your procedure and contact your primary care physician for an evaluation.  Remember:  Regular Business hours are:  Monday to Thursday 8:00 AM to 4:00 PM  Provider's Schedule: Milinda Pointer, MD:  Procedure  days: Tuesday and Thursday 7:30 AM to 4:00 PM  Gillis Santa, MD:  Procedure days: Monday and Wednesday 7:30 AM to 4:00 PM ____________________________________________________________________________________________  ____________________________________________________________________________________________  Preparing for Procedure with Sedation  Instructions: . Oral Intake: Do not eat or drink anything for at least 8 hours prior to your procedure. . Transportation: Public transportation is not allowed. Bring an adult driver. The driver must be physically present in our waiting room before any procedure can be started. Marland Kitchen Physical Assistance: Bring an adult physically capable of assisting you, in the event you need help. This adult should keep you company at home for at least 6 hours after the procedure. . Blood Pressure Medicine: Take your blood pressure medicine with a sip of water the morning of the procedure. . Blood thinners: Notify our staff if you are taking any blood thinners. Depending on which one you take, there will be specific instructions on how and when to stop it. . Diabetics on insulin: Notify the staff so that you can be scheduled 1st case in the morning. If  your diabetes requires high dose insulin, take only  of your normal insulin dose the morning of the procedure and notify the staff that you have done so. . Preventing infections: Shower with an antibacterial soap the morning of your procedure. . Build-up your immune system: Take 1000 mg of Vitamin C with every meal (3 times a day) the day prior to your procedure. Marland Kitchen Antibiotics: Inform the staff if you have a condition or reason that requires you to take antibiotics before dental procedures. . Pregnancy: If you are pregnant, call and cancel the procedure. . Sickness: If you have a cold, fever, or any active infections, call and cancel the procedure. . Arrival: You must be in the facility at least 30 minutes prior to your  scheduled procedure. . Children: Do not bring children with you. . Dress appropriately: Bring dark clothing that you would not mind if they get stained. . Valuables: Do not bring any jewelry or valuables.  Procedure appointments are reserved for interventional treatments only. Marland Kitchen No Prescription Refills. . No medication changes will be discussed during procedure appointments. . No disability issues will be discussed.  Reasons to call and reschedule or cancel your procedure: (Following these recommendations will minimize the risk of a serious complication.) . Surgeries: Avoid having procedures within 2 weeks of any surgery. (Avoid for 2 weeks before or after any surgery). . Flu Shots: Avoid having procedures within 2 weeks of a flu shots or . (Avoid for 2 weeks before or after immunizations). . Barium: Avoid having a procedure within 7-10 days after having had a radiological study involving the use of radiological contrast. (Myelograms, Barium swallow or enema study). . Heart attacks: Avoid any elective procedures or surgeries for the initial 6 months after a "Myocardial Infarction" (Heart Attack). . Blood thinners: It is imperative that you stop these medications before procedures. Let us know if you if you take any blood thinner.  . Infection: Avoid procedures during or within two weeks of an infection (including chest colds or gastrointestinal problems). Symptoms associated with infections include: Localized redness, fever, chills, night sweats or profuse sweating, burning sensation when voiding, cough, congestion, stuffiness, runny nose, sore throat, diarrhea, nausea, vomiting, cold or Flu symptoms, recent or current infections. It is specially important if the infection is over the area that we intend to treat. [facility] Heart and lung problems: Symptoms that may suggest an active cardiopulmonary problem include: cough, chest pain, breathing difficulties or shortness of breath, dizziness, ankle swelling,  uncontrolled high or unusually low blood pressure, and/or palpitations. If you are experiencing any of these symptoms, cancel your procedure and contact your primary care physician for an evaluation.  Remember:  Regular Business hours are:  Monday to Thursday 8:00 AM to 4:00 PM  Provider's Schedule: Milinda Pointer, MD:  Procedure days: Tuesday and Thursday 7:30 AM to 4:00 PM  Gillis Santa, MD:  Procedure days: Monday and Wednesday 7:30 AM to 4:00 PM ____________________________________________________________________________________________  ____________________________________________________________________________________________  Preparing for Procedure with Sedation  Instructions: . Oral Intake: Do not eat or drink anything for at least 8 hours prior to your procedure. . Transportation: Public transportation is not allowed. Bring an adult driver. The driver must be physically present in our waiting room before any procedure can be started. Marland Kitchen Physical Assistance: Bring an adult physically capable of assisting you, in the event you need help. This adult should keep you company at home for at least 6 hours after the procedure. . Blood Pressure Medicine: Take your blood  pressure medicine with a sip of water the morning of the procedure. . Blood thinners: Notify our staff if you are taking any blood thinners. Depending on which one you take, there will be specific instructions on how and when to stop it. . Diabetics on insulin: Notify the staff so that you can be scheduled 1st case in the morning. If your diabetes requires high dose insulin, take only  of your normal insulin dose the morning of the procedure and notify the staff that you have done so. . Preventing infections: Shower with an antibacterial soap the morning of your procedure. . Build-up your immune system: Take 1000 mg of Vitamin C with every meal (3 times a day) the day prior to your procedure. Marland Kitchen Antibiotics: Inform the  staff if you have a condition or reason that requires you to take antibiotics before dental procedures. . Pregnancy: If you are pregnant, call and cancel the procedure. . Sickness: If you have a cold, fever, or any active infections, call and cancel the procedure. . Arrival: You must be in the facility at least 30 minutes prior to your scheduled procedure. . Children: Do not bring children with you. . Dress appropriately: Bring dark clothing that you would not mind if they get stained. . Valuables: Do not bring any jewelry or valuables.  Procedure appointments are reserved for interventional treatments only. Marland Kitchen No Prescription Refills. . No medication changes will be discussed during procedure appointments. . No disability issues will be discussed.  Reasons to call and reschedule or cancel your procedure: (Following these recommendations will minimize the risk of a serious complication.) . Surgeries: Avoid having procedures within 2 weeks of any surgery. (Avoid for 2 weeks before or after any surgery). . Flu Shots: Avoid having procedures within 2 weeks of a flu shots or . (Avoid for 2 weeks before or after immunizations). . Barium: Avoid having a procedure within 7-10 days after having had a radiological study involving the use of radiological contrast. (Myelograms, Barium swallow or enema study). . Heart attacks: Avoid any elective procedures or surgeries for the initial 6 months after a "Myocardial Infarction" (Heart Attack). . Blood thinners: It is imperative that you stop these medications before procedures. Let us know if you if you take any blood thinner.  . Infection: Avoid procedures during or within two weeks of an infection (including chest colds or gastrointestinal problems). Symptoms associated with infections include: Localized redness, fever, chills, night sweats or profuse sweating, burning sensation when voiding, cough, congestion, stuffiness, runny nose, sore throat, diarrhea,  nausea, vomiting, cold or Flu symptoms, recent or current infections. It is specially important if the infection is over the area that we intend to treat. Marland Kitchen Heart and lung problems: Symptoms that may suggest an active cardiopulmonary problem include: cough, chest pain, breathing difficulties or shortness of breath, dizziness, ankle swelling, uncontrolled high or unusually low blood pressure, and/or palpitations. If you are experiencing any of these symptoms, cancel your procedure and contact your primary care physician for an evaluation.  Remember:  Regular Business hours are:  Monday to Thursday 8:00 AM to 4:00 PM  Provider's Schedule: Milinda Pointer, MD:  Procedure days: Tuesday and Thursday 7:30 AM to 4:00 PM  Gillis Santa, MD:  Procedure days: Monday and Wednesday 7:30 AM to 4:00 PM ____________________________________________________________________________________________

## 2019-01-19 NOTE — Telephone Encounter (Signed)
Noted  

## 2019-01-19 NOTE — Telephone Encounter (Signed)
PA was receive to office. Pt reported to primary doctor that she was no longer taking medication as of 12/25/2018.

## 2019-01-19 NOTE — Telephone Encounter (Signed)
Increase Praluent to 150 mg every 2 weeks.  Repeat fasting lipid profile in 2 months.

## 2019-01-20 ENCOUNTER — Telehealth: Payer: Self-pay | Admitting: Cardiovascular Disease

## 2019-01-20 LAB — ANA W/REFLEX IF POSITIVE: ANA: NEGATIVE

## 2019-01-20 LAB — RHEUMATOID FACTOR: Rheumatoid fact SerPl-aCnc: <10 IU/mL (ref 0.0–13.9)

## 2019-01-20 NOTE — Telephone Encounter (Signed)
Patient calling to check status of clearance °

## 2019-01-20 NOTE — Telephone Encounter (Signed)
Need clearance for the patient to stop plavix for 7 days prior to facial nerve block. Can be scheduled on 01/25/19 if she does not take it today. If she has sedation it will be moderate sedation, fentanyl and versed. Please fax clearance to [phone] if ok.

## 2019-01-20 NOTE — Telephone Encounter (Signed)
Routing to Dr Arida.  

## 2019-01-20 NOTE — Telephone Encounter (Signed)
   [facility] Pre-operative Risk Assessment    Request for surgical clearance:  1. What type of surgery is being performed? Elective facial nerve block  2. When is this surgery scheduled? Not listed  3. What type of clearance is required (medical clearance vs. Pharmacy clearance to hold med vs. Both)? Not listed  4. Are there any medications that need to  be held prior to surgery and how long? Not listed  5. Practice name and name of physician performing surgery? [facility]  6. What is your office phone [phone]   7.   What is your office fax number [phone]  8.   Anesthesia type (None, local, MAC, general) ? Not listed   Erika [name] 01/20/2019, 9:15 AM  _________________________________________________________________   (provider comments below)

## 2019-01-21 ENCOUNTER — Telehealth: Payer: Self-pay | Admitting: *Deleted

## 2019-01-21 MED ORDER — ALIROCUMAB 150 MG/ML ~~LOC~~ SOAJ: 150.0000 mg | SUBCUTANEOUS | 11 refills | Status: DC

## 2019-01-21 NOTE — Telephone Encounter (Signed)
Patient made aware to increase the Praluent to 150 mg every 2 weeks. Repeat lipid and liver in 2 months. Orders placed.

## 2019-01-21 NOTE — Telephone Encounter (Signed)
Low risk. Hold Plavix 5 days before procedure.

## 2019-01-21 NOTE — Telephone Encounter (Signed)
Called patient and she is aware that Dr. Rogue Jury ONLY gave clearance to stop Plavix for 5 days. She took it upon herself to stop it for 7 days and understands the risk of stopping it two days early.

## 2019-01-21 NOTE — Telephone Encounter (Signed)
Patient called and made aware to hold the Plavix 5 days prior.  Clearance successfully faxed to [phone]

## 2019-01-27 ENCOUNTER — Encounter: Payer: Self-pay | Admitting: Pain Medicine

## 2019-01-27 ENCOUNTER — Ambulatory Visit (HOSPITAL_BASED_OUTPATIENT_CLINIC_OR_DEPARTMENT_OTHER): Payer: BLUE CROSS/BLUE SHIELD | Admitting: Pain Medicine

## 2019-01-27 ENCOUNTER — Ambulatory Visit
Admission: RE | Admit: 2019-01-27 | Discharge: 2019-01-27 | Disposition: A | Discharge status: Home / Self Care | Payer: BLUE CROSS/BLUE SHIELD | Source: Ambulatory Visit | Attending: Pain Medicine | Admitting: Pain Medicine

## 2019-01-27 ENCOUNTER — Other Ambulatory Visit: Payer: Self-pay

## 2019-01-27 VITALS — BP 131/72 | HR 99 | Temp 97.6°F | Resp 18 | Ht 67.0 in | Wt 205.0 lb

## 2019-01-27 DIAGNOSIS — R6884 Jaw pain: Secondary | ICD-10-CM | POA: Insufficient documentation

## 2019-01-27 DIAGNOSIS — Z7901 Long term (current) use of anticoagulants: Secondary | ICD-10-CM | POA: Insufficient documentation

## 2019-01-27 DIAGNOSIS — G8929 Other chronic pain: Secondary | ICD-10-CM | POA: Insufficient documentation

## 2019-01-27 DIAGNOSIS — H9201 Otalgia, right ear: Secondary | ICD-10-CM | POA: Insufficient documentation

## 2019-01-27 DIAGNOSIS — R51 Headache: Secondary | ICD-10-CM | POA: Diagnosis not present

## 2019-01-27 DIAGNOSIS — B0221 Postherpetic geniculate ganglionitis: Secondary | ICD-10-CM | POA: Diagnosis not present

## 2019-01-27 DIAGNOSIS — G501 Atypical facial pain: Secondary | ICD-10-CM | POA: Insufficient documentation

## 2019-01-27 MED ORDER — LACTATED RINGERS IV SOLN
1000.0000 mL | Freq: Once | INTRAVENOUS | Status: AC
  Administered 2019-01-27: 1000 mL via INTRAVENOUS

## 2019-01-27 MED ORDER — LIDOCAINE HCL 2 % IJ SOLN
20.0000 mL | Freq: Once | INTRAMUSCULAR | Status: AC
  Administered 2019-01-27: 400 mg
  Filled 2019-01-27: qty 20

## 2019-01-27 MED ORDER — DEXAMETHASONE SODIUM PHOSPHATE 10 MG/ML IJ SOLN
10.0000 mg | Freq: Once | INTRAMUSCULAR | Status: AC
  Administered 2019-01-27: 10 mg
  Filled 2019-01-27: qty 1

## 2019-01-27 MED ORDER — FENTANYL CITRATE (PF) 100 MCG/2ML IJ SOLN
25.0000 ug | INTRAMUSCULAR | Status: DC | PRN
  Administered 2019-01-27: 100 ug via INTRAVENOUS
  Filled 2019-01-27: qty 2

## 2019-01-27 MED ORDER — IOPAMIDOL (ISOVUE-M 200) INJECTION 41%
5.0000 mL | Freq: Once | INTRAMUSCULAR | Status: AC
  Administered 2019-01-27: 10 mL
  Filled 2019-01-27: qty 10

## 2019-01-27 MED ORDER — MIDAZOLAM HCL 5 MG/5ML IJ SOLN
1.0000 mg | INTRAMUSCULAR | Status: DC | PRN
  Administered 2019-01-27: 5 mg via INTRAVENOUS
  Filled 2019-01-27: qty 5

## 2019-01-27 MED ORDER — LIDOCAINE HCL 2 % IJ SOLN
5.0000 mL | Freq: Once | INTRAMUSCULAR | Status: AC
  Administered 2019-01-27: 400 mg
  Filled 2019-01-27: qty 20

## 2019-01-27 MED ORDER — ROPIVACAINE HCL 2 MG/ML IJ SOLN
3.0000 mL | Freq: Once | INTRAMUSCULAR | Status: AC
  Administered 2019-01-27: 3 mL via EPIDURAL
  Filled 2019-01-27: qty 10

## 2019-01-27 NOTE — Patient Instructions (Signed)
____________________________________________________________________________________________  Post-Procedure Discharge Instructions  Instructions:  Apply ice: Fill a plastic sandwich bag with crushed ice. Cover it with a small towel and apply to injection site. Apply for 15 minutes then remove x 15 minutes. Repeat sequence on day of procedure, until you go to bed. The purpose is to minimize swelling and discomfort after procedure.  Apply heat: Apply heat to procedure site starting the day following the procedure. The purpose is to treat any soreness and discomfort from the procedure.  Food intake: Start with clear liquids (like water) and advance to regular food, as tolerated.   Physical activities: Keep activities to a minimum for the first 8 hours after the procedure.   Driving: If you have received any sedation, you are not allowed to drive for 24 hours after your procedure.  Blood thinner: Restart your blood thinner 6 hours after your procedure. (Only for those taking blood thinners)  Insulin: As soon as you can eat, you may resume your normal dosing schedule. (Only for those taking insulin)  Infection prevention: Keep procedure site clean and dry.  Post-procedure Pain Diary: Extremely important that this be done correctly and accurately. Recorded information will be used to determine the next step in treatment.  Pain evaluated is that of treated area only. Do not include pain from an untreated area.  Complete every hour, on the hour, for the initial 8 hours. Set an alarm to help you do this part accurately.  Do not go to sleep and have it completed later. It will not be accurate.  Follow-up appointment: Keep your follow-up appointment after the procedure. Usually 2 weeks for most procedures. (6 weeks in the case of radiofrequency.) Bring you pain diary.   Expect:  From numbing medicine (AKA: Local Anesthetics): Numbness or decrease in pain.  Onset: Full effect within 15  minutes of injected.  Duration: It will depend on the type of local anesthetic used. On the average, 1 to 8 hours.   From steroids: Decrease in swelling or inflammation. Once inflammation is improved, relief of the pain will follow.  Onset of benefits: Depends on the amount of swelling present. The more swelling, the longer it will take for the benefits to be seen. In some cases, up to 10 days.  Duration: Steroids will stay in the system x 2 weeks. Duration of benefits will depend on multiple posibilities including persistent irritating factors.  Occasional side-effects: Facial flushing (red, warm cheeks) , cramps (if present, drink Gatorade and take over-the-counter Magnesium 450-500 mg once to twice a day).  From procedure: Some discomfort is to be expected once the numbing medicine wears off. This should be minimal if ice and heat are applied as instructed.  Call if:  You experience numbness and weakness that gets worse with time, as opposed to wearing off.  New onset bowel or bladder incontinence. (This applies to Spinal procedures only)  Emergency Numbers:  Durning business hours (Monday - Thursday, 8:00 AM - 4:00 PM) (Friday, 9:00 AM - 12:00 Noon): [phone]  After hours: [phone] ____________________________________________________________________________________________    

## 2019-01-27 NOTE — Progress Notes (Signed)
Safety precautions to be maintained throughout the outpatient stay will include: orient to surroundings, keep bed in low position, maintain call bell within reach at all times, provide assistance with transfer out of bed and ambulation.  

## 2019-01-27 NOTE — Progress Notes (Signed)
Patient's Name: Erika [name]  MRN: [id number]  Referring Provider: Milinda Pointer, MD  DOB: 04/05/1971  PCP: Lucille Passy, MD  DOS: 01/27/2019  Note by: Gaspar Cola, MD  Service setting: Ambulatory outpatient  Specialty: Interventional Pain Management  Patient type: Established  Location: [facility] (AMB) Pain Management Facility  Visit type: Interventional Procedure   Primary Reason for Visit: Interventional Pain Management Treatment. CC: Ear Pain  Procedure:          Anesthesia, Analgesia, Anxiolysis:  Type: Diagnostic Facial nerve block  #1  Region: Face Level: Anterior border of mastoid process, immediately below the external auditory meatus and at the level of the middle of the ramus of the mandible   Laterality: Right  Type: Moderate (Conscious) Sedation combined with Local Anesthesia Indication(s): Analgesia and Anxiety Route: Intravenous (IV) IV Access: Secured Sedation: Meaningful verbal contact was maintained at all times during the procedure  Local Anesthetic: Lidocaine 1-2%  Position: Supine   Indications: 1. Chronic headaches (Primary Area of Pain) (Right)   2. Atypical facial pain   3. Chronic ear pain (Right)   4. Chronic jaw pain (Right)   5. Chronic anticoagulation (PLAVIX)    Pain Score: Pre-procedure: 5 /10 Post-procedure: 0-No pain/10  Pre-op Assessment:  Erika [name] is a 48 y.o. (year old), female patient, seen today for interventional treatment. She  has a past surgical history that includes Cholecystectomy; Tonsillectomy; Cesarean section; Cardiac catheterization ([name], 04/18/2016); Cardiac catheterization ([name], 06/29/2016); Coronary artery bypass graft ([name], 07/06/2016); TEE without cardioversion ([name], 07/06/2016); Endarterectomy (Right, 07/06/2016); Cardiac catheterization ([name], 08/29/2016); ABDOMINAL AORTOGRAM W/LOWER EXTREMITY ([name], 10/15/2018); and PERIPHERAL VASCULAR INTERVENTION (Left, 10/15/2018). Erika [name] has a current medication list which  includes the following prescription(s): albuterol, alirocumab, aspirin ec, benztropine, calcium carbonate, carvedilol, chlorpromazine, vitamin d3, clonazepam, clopidogrel, cyclobenzaprine, ergocalciferol, fluoxetine hcl, gabapentin, insulin glargine, insulin nph-regular human, insulin syringe-needle u-100, isosorbide mononitrate, lamotrigine, losartan, magic mouthwash, magnesium, naloxone, nitroglycerin, ondansetron, rosuvastatin, spironolactone, triamcinolone cream, and hydrocodone-acetaminophen, and the following Facility-Administered Medications: fentanyl, lactated ringers, and midazolam. Her primarily concern today is the Ear Pain  Initial Vital Signs:  Pulse/HCG Rate: 99ECG Heart Rate: 96 Temp: 98.3 F (36.8 C) Resp: 18 BP: (!) 155/94 SpO2: 97 %  BMI: Estimated body mass index is 32.11 kg/m as calculated from the following:   Height as of this encounter: 5\' 7"  (1.702 m).   Weight as of this encounter: 205 lb (93 kg).  Risk Assessment: Allergies: Reviewed. She has No Known Allergies.  Allergy Precautions: None required Coagulopathies: Reviewed. None identified.  Blood-thinner therapy: None at this time Active Infection(s): Reviewed. None identified. Erika [name] is afebrile  Site Confirmation: Erika [name] was asked to confirm the procedure and laterality before marking the site Procedure checklist: Completed Consent: Before the procedure and under the influence of no sedative(s), amnesic(s), or anxiolytics, the patient was informed of the treatment options, risks and possible complications. To fulfill our ethical and legal obligations, as recommended by the American Medical Association's Code of Ethics, I have informed the patient of my clinical impression; the nature and purpose of the treatment or procedure; the risks, benefits, and possible complications of the intervention; the alternatives, including doing nothing; the risk(s) and benefit(s) of the alternative treatment(s) or  procedure(s); and the risk(s) and benefit(s) of doing nothing. The patient was provided information about the general risks and possible complications associated with the procedure. These may include, but are not limited to: failure to achieve desired goals, infection, bleeding, organ or nerve damage,  allergic reactions, paralysis, and death. In addition, the patient was informed of those risks and complications associated to the procedure, such as failure to decrease pain; infection; bleeding; organ or nerve damage with subsequent damage to sensory, motor, and/or autonomic systems, resulting in permanent pain, numbness, and/or weakness of one or several areas of the body; allergic reactions; (i.e.: anaphylactic reaction); and/or death. Furthermore, the patient was informed of those risks and complications associated with the medications. These include, but are not limited to: allergic reactions (i.e.: anaphylactic or anaphylactoid reaction(s)); adrenal axis suppression; blood sugar elevation that in diabetics may result in ketoacidosis or comma; water retention that in patients with history of congestive heart failure may result in shortness of breath, pulmonary edema, and decompensation with resultant heart failure; weight gain; swelling or edema; medication-induced neural toxicity; particulate matter embolism and blood vessel occlusion with resultant organ, and/or nervous system infarction; and/or aseptic necrosis of one or more joints. Finally, the patient was informed that Medicine is not an exact science; therefore, there is also the possibility of unforeseen or unpredictable risks and/or possible complications that may result in a catastrophic outcome. The patient indicated having understood very clearly. We have given the patient no guarantees and we have made no promises. Enough time was given to the patient to ask questions, all of which were answered to the patient's satisfaction. Erika [name] has  indicated that she wanted to continue with the procedure. Attestation: I, the ordering provider, attest that I have discussed with the patient the benefits, risks, side-effects, alternatives, likelihood of achieving goals, and potential problems during recovery for the procedure that I have provided informed consent. Date  Time: 01/27/2019  8:12 AM  Pre-Procedure Preparation:  Monitoring: As per clinic protocol. Respiration, ETCO2, SpO2, BP, heart rate and rhythm monitor placed and checked for adequate function Safety Precautions: Patient was assessed for positional comfort and pressure points before starting the procedure. Time-out: I initiated and conducted the "Time-out" before starting the procedure, as per protocol. The patient was asked to participate by confirming the accuracy of the "Time Out" information. Verification of the correct person, site, and procedure were performed and confirmed by me, the nursing staff, and the patient. "Time-out" conducted as per Joint Commission's Universal Protocol (UP.01.01.01). Time: 1610  Description of Procedure:          Target Area: The lateral pterygoid plate, perpendicular to the base of the skull, under the zygomatic arch, and at the midpoint of the coronoid notch. Approach: Anterior approach Area Prepped: Entire one side of the face, from the lower orbital ridge down Prepping solution: Alcohol Prep Safety Precautions: Aspiration looking for blood return was conducted prior to all injections. At no point did we inject any substances, as a needle was being advanced. No attempts were made at seeking any paresthesias. Safe injection practices and needle disposal techniques used. Medications properly checked for expiration dates. SDV (single dose vial) medications used. Description of the Procedure: Protocol guidelines were followed. The target area was identified and the area prepped in the usual manner. Skin & deeper tissues infiltrated with local  anesthetic. Appropriate amount of time allowed to pass for local anesthetics to take effect. The procedure needles were then advanced to the target area. Proper needle placement secured. Negative aspiration confirmed. Solution injected in intermittent fashion, asking for systemic symptoms every 0.5cc of injectate. The needles were then removed and the area cleansed, making sure to leave some of the prepping solution back to take advantage of its long  term bactericidal properties.  Vitals:   01/27/19 0859 01/27/19 0910 01/27/19 0920 01/27/19 0930  BP: (!) 133/95 131/86 131/72   Pulse:      Resp: 18 18 19 18   Temp:  97.7 F (36.5 C)  97.6 F (36.4 C)  SpO2: 98% 97% 96% 98%  Weight:      Height:        Start Time: 0847 hrs. End Time: 0858 hrs. Materials:  Needle(s) Type: Spinal Needle Gauge: 25G Length: 1.5-in Medication(s): Please see orders for medications and dosing details.  Imaging Guidance (Non-Spinal):          Type of Imaging Technique: Fluoroscopy Guidance (Non-Spinal) Indication(s): Assistance in needle guidance and placement for procedures requiring needle placement in or near specific anatomical locations not easily accessible without such assistance. Exposure Time: Please see nurses notes. Contrast: Before injecting any contrast, we confirmed that the patient did not have an allergy to iodine, shellfish, or radiological contrast. Once satisfactory needle placement was completed at the desired level, radiological contrast was injected. Contrast injected under live fluoroscopy. No contrast complications. See chart for type and volume of contrast used. Fluoroscopic Guidance: I was personally present during the use of fluoroscopy. "Tunnel Vision Technique" used to obtain the best possible view of the target area. Parallax error corrected before commencing the procedure. "Direction-depth-direction" technique used to introduce the needle under continuous pulsed fluoroscopy. Once target  was reached, antero-posterior, oblique, and lateral fluoroscopic projection used confirm needle placement in all planes. Images permanently stored in EMR. Interpretation: I personally interpreted the imaging intraoperatively. Adequate needle placement confirmed in multiple planes. Appropriate spread of contrast into desired area was observed. No evidence of afferent or efferent intravascular uptake. Permanent images saved into the patient's record.  Antibiotic Prophylaxis:   Anti-infectives (From admission, onward)   None     Indication(s): None identified  Post-operative Assessment:  Post-procedure Vital Signs:  Pulse/HCG Rate: 9982 Temp: 97.6 F (36.4 C) Resp: 18 BP: 131/72 SpO2: 98 %  EBL: None  Complications: No immediate post-treatment complications observed by team, or reported by patient.  Note: The patient tolerated the entire procedure well. A repeat set of vitals were taken after the procedure and the patient was kept under observation following institutional policy, for this type of procedure. Post-procedural neurological assessment was performed, showing return to baseline, prior to discharge. The patient was provided with post-procedure discharge instructions, including a section on how to identify potential problems. Should any problems arise concerning this procedure, the patient was given instructions to immediately contact us, at any time, without hesitation. In any case, we plan to contact the patient by telephone for a follow-up status report regarding this interventional procedure.  Comments:  No additional relevant information.  Plan of Care    Imaging Orders     DG C-Arm 1-60 Min-No Report  Procedure Orders     FACIAL NERVE BLOCK  Medications ordered for procedure: Meds ordered this encounter  Medications  . lidocaine (XYLOCAINE) 2 % (with pres) injection 400 mg  . midazolam (VERSED) 5 MG/5ML injection 1-2 mg    Make sure Flumazenil is available in the  pyxis when using this medication. If oversedation occurs, administer 0.2 mg IV over 15 sec. If after 45 sec no response, administer 0.2 mg again over 1 min; may repeat at 1 min intervals; not to exceed 4 doses (1 mg)  . fentaNYL (SUBLIMAZE) injection 25-50 mcg    Make sure Narcan is available in the pyxis when using  this medication. In the event of respiratory depression (RR< 8/min): Titrate NARCAN (naloxone) in increments of 0.1 to 0.2 mg IV at 2-3 minute intervals, until desired degree of reversal.  . lactated ringers infusion 1,000 mL  . lidocaine (XYLOCAINE) 2 % (with pres) injection 100 mg  . ropivacaine (PF) 2 mg/mL (0.2%) (NAROPIN) injection 3 mL  . dexamethasone (DECADRON) injection 10 mg  . iopamidol (ISOVUE-M) 41 % intrathecal injection 5 mL    Must be Myelogram-compatible. If not available, you may substitute with a water-soluble, non-ionic, hypoallergenic, myelogram-compatible radiological contrast medium.   Medications administered: We administered lidocaine, midazolam, fentaNYL, lactated ringers, lidocaine, ropivacaine (PF) 2 mg/mL (0.2%), dexamethasone, and iopamidol.  See the medical record for exact dosing, route, and time of administration.  Disposition: Discharge home  Discharge Date & Time: 01/27/2019; 0930 hrs.   Physician-requested Follow-up: Return for post-procedure eval (2 wks), w/ Dr. Dossie Arbour.  Future Appointments  Date Time Provider [place]  01/28/2019 10:45 AM Venancio Poisson, NP [facility] None  01/29/2019 12:40 PM Lucille Passy, MD [facility]  02/10/2019  1:40 PM Wellington Hampshire, MD [facility]  02/11/2019 10:15 AM Milinda Pointer, MD [facility] None  02/18/2019  8:40 AM Adele Schilder, Arlyce Harman, MD [facility] None  03/30/2019  9:20 AM Lucille Passy, MD [facility]   Primary Care Physician: Lucille Passy, MD Location: [facility] Facility Note by: Gaspar Cola, MD Date: 01/27/2019; Time: 9:42 AM  Disclaimer:  Medicine is not  an Chief Strategy Officer. The only guarantee in medicine is that nothing is guaranteed. It is important to note that the decision to proceed with this intervention was based on the information collected from the patient. The Data and conclusions were drawn from the patient's questionnaire, the interview, and the physical examination. Because the information was provided in large part by the patient, it cannot be guaranteed that it has not been purposely or unconsciously manipulated. Every effort has been made to obtain as much relevant data as possible for this evaluation. It is important to note that the conclusions that lead to this procedure are derived in large part from the available data. Always take into account that the treatment will also be dependent on availability of resources and existing treatment guidelines, considered by other Pain Management Practitioners as being common knowledge and practice, at the time of the intervention. For Medico-Legal purposes, it is also important to point out that variation in procedural techniques and pharmacological choices are the acceptable norm. The indications, contraindications, technique, and results of the above procedure should only be interpreted and judged by a Board-Certified Interventional Pain Specialist with extensive familiarity and expertise in the same exact procedure and technique.

## 2019-01-28 ENCOUNTER — Telehealth: Payer: Self-pay

## 2019-01-28 ENCOUNTER — Ambulatory Visit: Payer: BLUE CROSS/BLUE SHIELD | Admitting: Adult Health

## 2019-01-28 NOTE — Telephone Encounter (Signed)
Spoke with patient.  She states she is doing very well and has no headaches.

## 2019-01-28 NOTE — Telephone Encounter (Signed)
Post procedure phone call.  LM 

## 2019-01-29 ENCOUNTER — Telehealth: Payer: Self-pay

## 2019-01-29 ENCOUNTER — Ambulatory Visit: Payer: Self-pay | Admitting: Family Medicine

## 2019-01-29 DIAGNOSIS — R700 Elevated erythrocyte sedimentation rate: Secondary | ICD-10-CM

## 2019-01-29 DIAGNOSIS — R7982 Elevated C-reactive protein (CRP): Secondary | ICD-10-CM

## 2019-01-29 NOTE — Telephone Encounter (Signed)
Yes.   Referral placed.  

## 2019-01-29 NOTE — Telephone Encounter (Signed)
TA-Pt would like to be referred to Rheumatology based on the C-RP & Sed Rate results being elevated on 1.15.20 lab work/thx dmf

## 2019-02-05 ENCOUNTER — Other Ambulatory Visit: Payer: Self-pay | Admitting: Family Medicine

## 2019-02-10 ENCOUNTER — Encounter: Payer: Self-pay | Admitting: Cardiovascular Disease

## 2019-02-10 ENCOUNTER — Ambulatory Visit: Payer: BLUE CROSS/BLUE SHIELD | Admitting: Cardiovascular Disease

## 2019-02-10 VITALS — BP 144/84 | HR 108 | Ht 67.0 in | Wt 203.0 lb

## 2019-02-10 DIAGNOSIS — I739 Peripheral vascular disease, unspecified: Secondary | ICD-10-CM | POA: Diagnosis not present

## 2019-02-10 DIAGNOSIS — I25118 Atherosclerotic heart disease of native coronary artery with other forms of angina pectoris: Secondary | ICD-10-CM

## 2019-02-10 DIAGNOSIS — I10 Essential (primary) hypertension: Secondary | ICD-10-CM

## 2019-02-10 DIAGNOSIS — I6523 Occlusion and stenosis of bilateral carotid arteries: Secondary | ICD-10-CM

## 2019-02-10 DIAGNOSIS — Z720 Tobacco use: Secondary | ICD-10-CM

## 2019-02-10 DIAGNOSIS — E785 Hyperlipidemia, unspecified: Secondary | ICD-10-CM

## 2019-02-10 NOTE — Patient Instructions (Signed)
Medication Instructions:  No changes If you need a refill on your cardiac medications before your next appointment, please call your pharmacy.   Lab work: None ordered If you have labs (blood work) drawn today and your tests are completely normal, you will receive your results only by: Marland Kitchen MyChart Message (if you have MyChart) OR . A paper copy in the mail If you have any lab test that is abnormal or we need to change your treatment, we will call you to review the results.  Testing/Procedures: Your physician has requested that you have a carotid duplex in May. This test is an ultrasound of the carotid arteries in your neck. It looks at blood flow through these arteries that supply the brain with blood. Allow one hour for this exam. There are no restrictions or special instructions. This will take place at [place] [address], the [facility] office.  Your physician has requested that you have a lower extremity arterial duplex in May. During this test, ultrasound is used to evaluate arterial blood flow in the legs. Allow one hour for this exam. There are no restrictions or special instructions. This will take place at [place] [address], the [facility] office.  Your physician has requested that you have an ankle brachial index (ABI) In May. During this test an ultrasound and blood pressure cuff are used to evaluate the arteries that supply the arms and legs with blood. Allow thirty minutes for this exam. There are no restrictions or special instructions. This will take place at [place] [address], the [facility] office.  Follow-Up: At [facility], you and your health needs are our priority.  As part of our continuing mission to provide you with exceptional heart care, we have created designated Provider Care Teams.  These Care Teams include your primary Cardiologist (physician) and Advanced Practice Providers (APPs -  Physician Assistants and Nurse Practitioners)  who all work together to provide you with the care you need, when you need it. You will need a follow up appointment in 6 months.  Please call our office 2 months in advance to schedule this appointment.  You may see Dr. Fletcher Anon or one of the following Advanced Practice Providers on your designated Care Team:   Murray Hodgkins, NP Christell Faith, PA-C . Marrianne Mood, PA-C

## 2019-02-10 NOTE — Progress Notes (Signed)
Cardiology Office Note   Date:  02/10/2019   ID:  Erika [name], DOB April 13, 1971, MRN [id number]  PCP:  Erika Passy, MD Cardiologist:   Erika Sacramento, MD   Chief Complaint  Patient presents with  . other    3 mo follow up.Denies CP,SOB. Medications reviewed verbally with patient. "Doing Well"      History of Present Illness: Erika [name] is a 48 y.o. female who presents for a follow-up visit regarding coronary artery disease .[name] with no evidence of secondary hypertension. She presented in July, 2017 with non-ST elevation myocardial infarction. She underwent cardiac catheterization which showed severe three-vessel coronary artery disease.  Preop carotid Doppler showed 80-99% right ICA stenosis. She underwent CABG and right carotid endarterectomy at the same time in July of 2017.  She was rehospitalized in September, 2017 with chest pain in the setting of uncontrolled hypertension. Cardiac catheterization showed patent grafts . The LAD had diffuse disease distal to the anastomosis and was very small in caliber. Ejection fraction was normal with mildly elevated left ventricular end-diastolic pressure.   She has history of difficult to control hypertension with labile blood pressure.  No evidence of renal artery stenosis on previous angiography.  She has known history of severe mixed hyperlipidemia .  She is known to have bilateral leg claudication worse on the left side.  Angiography in October 2019 showed significant proximal left SFA stenosis followed by short occlusion in the mid to distal segment and three-vessel runoff below the knee.  I performed successful drug-eluting stent placement to the mid and distal SFA and drug-coated balloon angioplasty to the proximal SFA .  On the right side, there was borderline significant mid SFA stenosis.  She has been doing reasonably well with no recent chest pain or shortness of breath.   She has some leg discomfort on both sides but she is not sure if it is in the muscle or the joints.  She is mildly tachycardic today.  She rushed to the appointment as she was running late. She was having cluster headaches recently that improved with nerve block.  Past Medical History:  Diagnosis Date  . Arthralgia of temporomandibular joint   . CAD, multiple vessel    a. cath 06/29/16: ostLM 40%, ostLAD 40%, pLAD 95%, ost-pLCx 60%, pLCx 95%, mLCx 60%, mRCA 95%, D2 50%, LVSF nl;  b. 07/2016 CABG x 4 (LIMA->LAD, VG->Diag, VG->OM, VG->RCA); c. 08/2016 Cath: 3VD w/ 4/4 patent grafts. LAD distal to LIMA has diff dzs->Med rx.  . Carotid arterial disease ([place])    a. 07/2016 s/p R CEA.  . Depression   . Diastolic dysfunction    a. echo 06/28/16: EF 50-55%, mild inf wall HK, GR1DD, mild MR, RV sys fxn nl, mildly dilated LA, PASP nl  . Fatty liver disease, nonalcoholic 0175  . HLD (hyperlipidemia)   . Labile hypertension    a. prior renal ngiogram negative for RAS in 03/2016; b. catecholamines and metanephrines normal, mildly elevated renin with normal aldosterone and normal ratio in 02/2016  . Obesity   . PTSD (post-traumatic stress disorder)   . Tobacco abuse    a. 2018 - cut back from 2 ppd to 0.5 ppd.  . Type 2 diabetes mellitus [facility]) January 2017    Past Surgical History:  Procedure Laterality Date  . ABDOMINAL AORTOGRAM W/LOWER EXTREMITY [name] 10/15/2018   Procedure: ABDOMINAL AORTOGRAM W/LOWER EXTREMITY;  Surgeon: Wellington Hampshire, MD;  Location:  [place] [facility];  Service: Cardiovascular;  Laterality: [name];  . CARDIAC CATHETERIZATION [name] 06/29/2016   Procedure: Left Heart Cath and Coronary Angiography;  Surgeon: Minna Merritts, MD;  Location: [place] [facility];  Service: Cardiovascular;  Laterality: [name];  . CARDIAC CATHETERIZATION [name] 08/29/2016   Procedure: Left Heart Cath and Cors/Grafts Angiography;  Surgeon: Wellington Hampshire, MD;  Location: [place] [facility];  Service: Cardiovascular;   Laterality: [name];  . CESAREAN SECTION    . CHOLECYSTECTOMY    . CORONARY ARTERY BYPASS GRAFT [name] 07/06/2016   Procedure: CORONARY ARTERY BYPASS GRAFTING (CABG) x four, using left internal mammary artery and right leg greater saphenous vein harvested endoscopically;  Surgeon: Ivin Poot, MD;  Location: [place];  Service: Open Heart Surgery;  Laterality: [name];  . ENDARTERECTOMY Right 07/06/2016   Procedure: ENDARTERECTOMY CAROTID;  Surgeon: Rosetta Posner, MD;  Location: [place];  Service: Vascular;  Laterality: Right;  . PERIPHERAL VASCULAR CATHETERIZATION [name] 04/18/2016   Procedure: Renal Angiography;  Surgeon: Wellington Hampshire, MD;  Location: [place] [facility];  Service: Cardiovascular;  Laterality: [name];  . PERIPHERAL VASCULAR INTERVENTION Left 10/15/2018   Procedure: PERIPHERAL VASCULAR INTERVENTION;  Surgeon: Wellington Hampshire, MD;  Location: [place] [facility];  Service: Cardiovascular;  Laterality: Left;  Left superficial femoral  . TEE WITHOUT CARDIOVERSION [name] 07/06/2016   Procedure: TRANSESOPHAGEAL ECHOCARDIOGRAM (TEE);  Surgeon: Ivin Poot, MD;  Location: [place];  Service: Open Heart Surgery;  Laterality: [name];  . TONSILLECTOMY       Current Outpatient Medications  Medication Sig Dispense Refill  . albuterol (PROVENTIL HFA;VENTOLIN HFA) 108 (90 Base) MCG/ACT inhaler Inhale 2 puffs into the lungs every 6 (six) hours as needed for wheezing. 1 Inhaler 0  . Alirocumab (PRALUENT) 150 MG/ML SOAJ Inject 150 mg into the skin every 14 (fourteen) days. 2 pen 11  . aspirin EC 81 MG tablet Take 1 tablet (81 mg total) by mouth daily. 90 tablet 3  . benztropine (COGENTIN) 0.5 MG tablet Take 1 tablet (0.5 mg total) by mouth daily. 90 tablet 0  . calcium carbonate (CALCIUM 600) 600 MG TABS tablet Take 1 tablet (600 mg total) by mouth 2 (two) times daily with a meal for 30 days. 60 tablet 0  . carvedilol (COREG) 25 MG tablet TAKE 1 TABLET (25 MG TOTAL) BY MOUTH 2 (TWO) TIMES DAILY WITH A MEAL. 60 tablet  1  . chlorproMAZINE (THORAZINE) 25 MG tablet Take 1 tablet (25 mg total) by mouth at bedtime. 90 tablet 0  . Cholecalciferol (VITAMIN D3) 125 MCG (5000 UT) CAPS Take 1 capsule (5,000 Units total) by mouth daily with breakfast. Take along with calcium and magnesium. 30 capsule 5  . clonazePAM (KLONOPIN) 0.5 MG tablet Take one tab three times a day and 4th as needed 100 tablet 1  . clopidogrel (PLAVIX) 75 MG tablet Take 1 tablet (75 mg total) by mouth daily. 30 tablet 11  . cyclobenzaprine (FEXMID) 7.5 MG tablet TAKE 1 TABLET BY MOUTH 3 TIMES A DAY AS NEEDED FOR MUSCLE SPASMS 60 tablet 5  . ergocalciferol (VITAMIN D2) 1.25 MG (50000 UT) capsule Take 1 capsule (50,000 Units total) by mouth 2 (two) times a week. X 6 weeks. 12 capsule 0  . FLUoxetine HCl 60 MG TABS Take 60 mg by mouth daily. 90 tablet 0  . gabapentin (NEURONTIN) 100 MG capsule Take 1-3 capsules (100-300 mg total) by mouth at bedtime for 30 days. Follow written  titration schedule. 90 capsule 0  . Insulin Glargine (LANTUS SOLOSTAR) 100 UNIT/ML Solostar Pen Inject 40 Units into the skin daily. 5 pen PRN  . insulin NPH-regular Human (NOVOLIN 70/30 RELION) (70-30) 100 UNIT/ML injection Inject 5u SQ bid; but follow flow chart and may increase to 25u depending on flow chart up to tid 10 mL 2  . Insulin Syringe-Needle U-100 29G X 1/2" 0.3 ML MISC 1 each by Does not apply route 2 (two) times daily. 30 each 0  . isosorbide mononitrate (IMDUR) 30 MG 24 hr tablet TAKE 1 TABLET (30 MG TOTAL) BY MOUTH DAILY. PLEASE KEEP APPOINTMENT FOR FURTHER REFILLS! THANKS :) 90 tablet 3  . lamoTRIgine (LAMICTAL) 200 MG tablet Take 1 tablet (200 mg total) by mouth daily. 90 tablet 0  . losartan (COZAAR) 100 MG tablet Take 1 tablet (100 mg total) by mouth daily. 90 tablet 1  . magic mouthwash SOLN Take 15 mLs by mouth 3 (three) times daily as needed for mouth pain. 250 mL 1  . Magnesium 500 MG CAPS Take 1 capsule (500 mg total) by mouth 2 (two) times daily at 8 am  and 10 pm. 60 capsule 5  . naloxone (NARCAN) nasal spray 4 mg/0.1 mL 4 mg (contents of 1 nasal spray) as a single dose in one nostril; may repeat every 2 to 3 minutes in alternating nostrils until medical assistance becomes available 1 kit 0  . nitroGLYCERIN (NITROSTAT) 0.4 MG SL tablet PLACE 1 TABLET (0.4 MG TOTAL) UNDER THE TONGUE EVERY 5 (FIVE) MINUTES AS NEEDED FOR CHEST PAIN. 25 tablet PRN  . ondansetron (ZOFRAN) 4 MG tablet TAKE 1 TABLET (4 MG TOTAL) BY MOUTH DAILY AS NEEDED. 10 tablet 2  . rosuvastatin (CRESTOR) 10 MG tablet Take 1 tablet (10 mg total) by mouth daily. 90 tablet 3  . spironolactone (ALDACTONE) 25 MG tablet Take 1 tablet (25 mg total) by mouth daily. 30 tablet 5   No current facility-administered medications for this visit.     Allergies:   Patient has no known allergies.    Social History:  The patient  reports that she has been smoking cigarettes. She has been smoking about 1.00 pack per day. She has never used smokeless tobacco. She reports current alcohol use. She reports that she does not use drugs.   Family History:  The patient's family history includes Alcohol abuse in her father; Anxiety disorder in her sister; Diabetes in her father and mother; Drug abuse in her father; Heart disease in her father; Stroke in her sister. She was adopted.    ROS:  Please see the history of present illness.   Otherwise, review of systems are positive for none.   All other systems are reviewed and negative.    PHYSICAL EXAM: VS:  BP (!) 144/84 (BP Location: Left Arm, Patient Position: Sitting, Cuff Size: Normal)   Pulse (!) 108   Ht '5\' 7"'$  (1.702 m)   Wt 203 lb (92.1 kg)   BMI 31.79 kg/m  , BMI Body mass index is 31.79 kg/m. GEN: Well nourished, well developed, in no acute distress  HEENT: normal  Neck: no JVD, carotid bruits, or masses Cardiac: RRR; no  rubs, or gallops,no edema . There is 1/6 systolic flow murmur in the pulmonic area Respiratory:  clear to auscultation  bilaterally, normal work of breathing GI: soft, nontender, nondistended, + BS MS: no deformity or atrophy  Skin: warm and dry, no rash Neuro:  Strength and sensation are intact Psych:  euthymic mood, full affect   EKG:  EKG is  ordered today. EKG showed sinus tachycardia with possible left atrial enlargement with ST and T wave changes in the inferolateral leads suggestive of ischemia.   Recent Labs: 02/25/2018: TSH 1.68 06/30/2018: B Natriuretic Peptide 21.0 10/10/2018: Hemoglobin 15.5; Platelets 361 01/07/2019: ALT 46; BUN 13; Creatinine, Ser 0.71; Magnesium 2.0; Potassium 4.8; Sodium 137    Lipid Panel    Component Value Date/Time   CHOL 234 (H) 01/07/2019 0924   CHOL 121 08/21/2016 0817   TRIG 406 (H) 01/07/2019 0924   HDL 30 (L) 01/07/2019 0924   HDL 24 (L) 08/21/2016 0817   CHOLHDL 7.8 01/07/2019 0924   VLDL UNABLE TO CALCULATE IF TRIGLYCERIDE OVER 400 mg/dL 01/07/2019 0924   LDLCALC UNABLE TO CALCULATE IF TRIGLYCERIDE OVER 400 mg/dL 01/07/2019 0924   LDLCALC 64 08/21/2016 0817   LDLDIRECT 155.0 09/24/2018 0819      Wt Readings from Last 3 Encounters:  02/10/19 203 lb (92.1 kg)  01/27/19 205 lb (93 kg)  01/19/19 205 lb (93 kg)         ASSESSMENT AND PLAN:  1.  Peripheral arterial disease with severe bilateral calf claudication: Status post successful revascularization to the left SFA.   Continue dual antiplatelet therapy.  Continue aggressive treatment of risk factors.  She has borderline significant right SFA stenosis with stable symptoms.  Continue medical therapy.  Repeat ABI and duplex in May.  2. Coronary artery disease involving native coronary arteries with stable angina: Status post CABG in July of 2017.   Symptoms are controlled with long-acting nitroglycerin and a beta-blocker.  3. Carotid artery disease status post right carotid endarterectomy. Continue aggressive treatment of risk factors. she stopped following up at [facility].  I requested a follow-up carotid  Doppler.  4. Essential hypertension: Blood pressure is better than her usual.  Continue same medications.  5. Hyperlipidemia: Continue treatment with rosuvastatin and Praluent.  6. Tobacco use: Smoking cessation advised    Disposition:   FU with me in 6 months  Signed,  Erika Sacramento, MD  02/10/2019 2:18 PM    [place]

## 2019-02-11 ENCOUNTER — Other Ambulatory Visit: Payer: Self-pay

## 2019-02-11 ENCOUNTER — Ambulatory Visit: Payer: BLUE CROSS/BLUE SHIELD | Attending: Pain Medicine | Admitting: Pain Medicine

## 2019-02-11 ENCOUNTER — Encounter: Payer: Self-pay | Admitting: Pain Medicine

## 2019-02-11 VITALS — BP 213/122 | HR 99 | Temp 98.2°F | Ht 67.0 in | Wt 203.0 lb

## 2019-02-11 DIAGNOSIS — R6884 Jaw pain: Secondary | ICD-10-CM | POA: Insufficient documentation

## 2019-02-11 DIAGNOSIS — M792 Neuralgia and neuritis, unspecified: Secondary | ICD-10-CM | POA: Insufficient documentation

## 2019-02-11 DIAGNOSIS — B0221 Postherpetic geniculate ganglionitis: Secondary | ICD-10-CM | POA: Insufficient documentation

## 2019-02-11 DIAGNOSIS — G8929 Other chronic pain: Secondary | ICD-10-CM | POA: Insufficient documentation

## 2019-02-11 DIAGNOSIS — R519 Headache, unspecified: Secondary | ICD-10-CM

## 2019-02-11 DIAGNOSIS — R51 Headache: Secondary | ICD-10-CM | POA: Insufficient documentation

## 2019-02-11 DIAGNOSIS — H9201 Otalgia, right ear: Secondary | ICD-10-CM | POA: Insufficient documentation

## 2019-02-11 DIAGNOSIS — G501 Atypical facial pain: Secondary | ICD-10-CM | POA: Diagnosis not present

## 2019-02-11 MED ORDER — GABAPENTIN 100 MG PO CAPS: 100.0000 mg | ORAL_CAPSULE | Freq: Four times a day (QID) | ORAL | 0 refills | Status: DC

## 2019-02-11 NOTE — Progress Notes (Signed)
Patient's Name: Erika [name]  MRN: [id number]  Referring Provider: Lucille Passy, MD  DOB: 01/02/1971  PCP: Lucille Passy, MD  DOS: 02/11/2019  Note by: Gaspar Cola, MD  Service setting: Ambulatory outpatient  Specialty: Interventional Pain Management  Location: [facility] (AMB) Pain Management Facility    Patient type: Established   Primary Reason(s) for Visit: Encounter for post-procedure evaluation of chronic illness with mild to moderate exacerbation CC: Ear Pain  HPI  Erika [name] is a 48 y.o. year old, female patient, who comes today for a post-procedure evaluation. She has HLD (hyperlipidemia); ADJUSTMENT DISORDER WITH MIXED FEATURES; Cluster headache; HTN (hypertension), malignant; Agoraphobia with panic attacks; MDD (major depressive disorder); Proteinuria; Renal artery stenosis ([place]); Chest pain with high risk for cardiac etiology; NSTEMI (non-ST elevated myocardial infarction) ([place]); Elevated troponin; Essential hypertension, malignant; Tobacco abuse; Essential hypertension; Malignant hypertension; Diabetes ([place]); CAD in native artery; Bradycardia; CAD (coronary artery disease); Carotid stenosis; S/P CABG x 4; Constipation; Hypertension, accelerated with heart disease, without CHF; Insomnia; Major depressive disorder, recurrent episode, moderate ([place]); Sepsis ([place]); Hyperosmolar non-ketotic state in patient with type 2 diabetes mellitus ([place]); Elevated troponin I level; Other chronic pain; Bilateral hip pain; Chronic pain of both ankles; Tendinopathy of right gluteus medius; Tendinopathy of left gluteus medius; Myalgia; Edema; Insect bite; Opiate use; Chronic pain syndrome; Long term current use of opiate analgesic; Long term prescription benzodiazepine use; Pharmacologic therapy; Disorder of skeletal system; Problems influencing health status; Chronic headaches (Primary Area of Pain) (Right); Elevated C-reactive protein (CRP); Elevated sed rate; Vitamin D deficiency; Neurogenic pain;  Chronic anticoagulation (PLAVIX); Atypical facial pain; Chronic ear pain (Right); Chronic jaw pain (Right); and Geniculate Neuralgia (Right) on their problem list. Her primarily concern today is the Ear Pain  Pain Assessment: Location: Right Ear Radiating: radiaties throught ear to brain and out of eye Onset: More than a month ago Duration: Chronic pain Quality: Sharp, Stabbing, Penetrating, Constant Severity: 4 /10 (subjective, self-reported pain score)  Note: Reported level is inconsistent with clinical observations. Clinically the patient looks like a 2/10 A 2/10 is viewed as "Mild to Moderate" and described as noticeable and distracting. Impossible to hide from other people. More frequent flare-ups. Still possible to adapt and function close to normal. It can be very annoying and may have occasional stronger flare-ups. With discipline, patients may get used to it and adapt. Erika [name] continues to use a standard subjective pain scale, rather than an objective pain scale as instructed. When using our objective Pain Scale, levels between 6 and 10/10 are said to belong in an emergency room, as it progressively worsens from a 6/10, described as severely limiting, requiring emergency care not usually available at an outpatient pain management facility. At a 6/10 level, communication becomes difficult and requires great effort. Assistance to reach the emergency department may be required. Facial flushing and profuse sweating along with potentially dangerous increases in heart rate and blood pressure will be evident. Effect on ADL: limits my daily activities Timing: Constant Modifying factors: procedure BP: (!) 213/122  HR: 99  Ms. Schechter comes in today for post-procedure evaluation.  The patient returns to the clinic today indicating excellent relief of the pain from the right sided facial nerve/geniculate nerve block.  She indicates having been 100% pain-free until yesterday when the pain  started coming back.  At this point, the plan is to have her come back for a diagnostic block #2.  I will repeat the block with exactly the same medications  and technique and then we will compare the duration of the benefit.  If she attains longer lasting benefit, this would be an indication that the patient may benefit from a series of this injections.  If on the other hand, she again gets excellent relief of the pain for the duration of the local anesthetic but the total duration of the benefit is equal to or less than what she had pain with the first one, then we will have to consider the possibility of radiofrequency ablation.  This plan was shared with the patient and she agreed with it.  In addition to this, we talked about her Neurontin and she is already taking 300 mg at bedtime without any type of complications.  She indicated that at the beginning she was experiencing some somnolence with the use of the medication, but her body has gotten used to it and she no longer has this side effect.  The patient was wondering whether or not I wanted to continue her on the same medicine or to increase it.  I told her that she should be the judge of than and that she should stop increasing the dose at that point where she is comfortable.  However, I also told her that should she believe that she needed more medicine, she could then start using it during the daytime.  I have provided the patient with clear written instructions on how to continue titrating medication up, should she decide to do that.  I make sure to remind her that she needs to do this very slowly so as to avoid running into side effects.  She understood.  Further details on both, my assessment(s), as well as the proposed treatment plan, please see below.  Post-Procedure Assessment  01/27/2019 Procedure: Diagnostic right-sided facial nerve block #1 under fluoroscopic guidance and IV sedation Pre-procedure pain score:  5/10 Post-procedure pain  score: 0/10 (100% relief) Influential Factors: BMI: 31.79 kg/m Intra-procedural challenges: None observed.         Assessment challenges: None detected.              Reported side-effects: None.        Post-procedural adverse reactions or complications: None reported         Sedation: Sedation provided. When no sedatives are used, the analgesic levels obtained are directly associated to the effectiveness of the local anesthetics. However, when sedation is provided, the level of analgesia obtained during the initial 1 hour following the intervention, is believed to be the result of a combination of factors. These factors may include, but are not limited to: 1. The effectiveness of the local anesthetics used. 2. The effects of the analgesic(s) and/or anxiolytic(s) used. 3. The degree of discomfort experienced by the patient at the time of the procedure. 4. The patients ability and reliability in recalling and recording the events. 5. The presence and influence of possible secondary gains and/or psychosocial factors. Reported result: Relief experienced during the 1st hour after the procedure: 100 % (Ultra-Short Term Relief)            Interpretative annotation: Clinically appropriate result. Analgesia during this period is likely to be Local Anesthetic and/or IV Sedative (Analgesic/Anxiolytic) related.          Effects of local anesthetic: The analgesic effects attained during this period are directly associated to the localized infiltration of local anesthetics and therefore cary significant diagnostic value as to the etiological location, or anatomical origin, of the pain. Expected duration of relief  is directly dependent on the pharmacodynamics of the local anesthetic used. Long-acting (4-6 hours) anesthetics used.  Reported result: Relief during the next 4 to 6 hour after the procedure: 100 % (Short-Term Relief)            Interpretative annotation: Clinically appropriate result. Analgesia  during this period is likely to be Local Anesthetic-related.          Long-term benefit: Defined as the period of time past the expected duration of local anesthetics (1 hour for short-acting and 4-6 hours for long-acting). With the possible exception of prolonged sympathetic blockade from the local anesthetics, benefits during this period are typically attributed to, or associated with, other factors such as analgesic sensory neuropraxia, antiinflammatory effects, or beneficial biochemical changes provided by agents other than the local anesthetics.  Reported result: Extended relief following procedure: 100 %(two weeks of relief, headache came back just yesterday) (Long-Term Relief)            Interpretative annotation: Clinically possible results. Good relief. No permanent benefit expected. Inflammation plays a part in the etiology to the pain. Etiology is likely inflammatory and mechanical.  Current benefits: Defined as reported results that persistent at this point in time.   Analgesia: 75-100 % She indicates having had complete relief of her headaches until yesterday when she started again having some.  I believe that she has a combination of swelling as well as an entrapment neuropathy secondary to her prior carotid surgery on that same side. Function: Ms. Gwinner reports improvement in function ROM: Ms. Coval reports improvement in ROM Interpretative annotation: Ongoing benefit. Therapeutic success. Effective therapeutic approach. Benefit could be steroid-related.  Interpretation: Results would suggest a successful diagnostic intervention.                  Plan:  Consider diagnostic procedure No.: 2          Laboratory Chemistry  Inflammation Markers (CRP: Acute Phase) (ESR: Chronic Phase) Lab Results  Component Value Date   CRP 15 (H) 01/07/2019   ESRSEDRATE 73 (H) 01/07/2019   LATICACIDVEN 2.3 (HH) 05/19/2017                         Renal Markers Lab Results  Component  Value Date   BUN 13 01/07/2019   CREATININE 0.71 01/07/2019   BCR 18 01/07/2019   GFRAA 119 01/07/2019   GFRNONAA 103 01/07/2019                             Hepatic Markers Lab Results  Component Value Date   AST 17 01/07/2019   ALT 46 (H) 01/07/2019   ALBUMIN 4.6 01/07/2019                        Note: Lab results reviewed.  Recent Imaging Results   Results for orders placed in visit on 01/27/19  DG C-Arm 1-60 Min-No Report   Narrative Fluoroscopy was utilized by the requesting physician.  No radiographic  interpretation.      Interpretation Report: Fluoroscopy was used during the procedure to assist with needle guidance. The images were interpreted intraoperatively by the requesting physician.  Meds   Current Outpatient Medications:  .  albuterol (PROVENTIL HFA;VENTOLIN HFA) 108 (90 Base) MCG/ACT inhaler, Inhale 2 puffs into the lungs every 6 (six) hours as needed for wheezing., Disp: 1 Inhaler, Rfl: 0 .  Alirocumab (PRALUENT) 150 MG/ML SOAJ, Inject 150 mg into the skin every 14 (fourteen) days., Disp: 2 pen, Rfl: 11 .  aspirin EC 81 MG tablet, Take 1 tablet (81 mg total) by mouth daily., Disp: 90 tablet, Rfl: 3 .  benztropine (COGENTIN) 0.5 MG tablet, Take 1 tablet (0.5 mg total) by mouth daily., Disp: 90 tablet, Rfl: 0 .  calcium carbonate (CALCIUM 600) 600 MG TABS tablet, Take 1 tablet (600 mg total) by mouth 2 (two) times daily with a meal for 30 days., Disp: 60 tablet, Rfl: 0 .  carvedilol (COREG) 25 MG tablet, TAKE 1 TABLET (25 MG TOTAL) BY MOUTH 2 (TWO) TIMES DAILY WITH A MEAL., Disp: 60 tablet, Rfl: 1 .  chlorproMAZINE (THORAZINE) 25 MG tablet, Take 1 tablet (25 mg total) by mouth at bedtime., Disp: 90 tablet, Rfl: 0 .  Cholecalciferol (VITAMIN D3) 125 MCG (5000 UT) CAPS, Take 1 capsule (5,000 Units total) by mouth daily with breakfast. Take along with calcium and magnesium., Disp: 30 capsule, Rfl: 5 .  clonazePAM (KLONOPIN) 0.5 MG tablet, Take one tab three times a  day and 4th as needed, Disp: 100 tablet, Rfl: 1 .  clopidogrel (PLAVIX) 75 MG tablet, Take 1 tablet (75 mg total) by mouth daily., Disp: 30 tablet, Rfl: 11 .  cyclobenzaprine (FEXMID) 7.5 MG tablet, TAKE 1 TABLET BY MOUTH 3 TIMES A DAY AS NEEDED FOR MUSCLE SPASMS, Disp: 60 tablet, Rfl: 5 .  ergocalciferol (VITAMIN D2) 1.25 MG (50000 UT) capsule, Take 1 capsule (50,000 Units total) by mouth 2 (two) times a week. X 6 weeks., Disp: 12 capsule, Rfl: 0 .  FLUoxetine HCl 60 MG TABS, Take 60 mg by mouth daily., Disp: 90 tablet, Rfl: 0 .  gabapentin (NEURONTIN) 100 MG capsule, Take 1-3 capsules (100-300 mg total) by mouth 4 (four) times daily for 30 days. Follow written titration schedule., Disp: 360 capsule, Rfl: 0 .  Insulin Glargine (LANTUS SOLOSTAR) 100 UNIT/ML Solostar Pen, Inject 40 Units into the skin daily., Disp: 5 pen, Rfl: PRN .  insulin NPH-regular Human (NOVOLIN 70/30 RELION) (70-30) 100 UNIT/ML injection, Inject 5u SQ bid; but follow flow chart and may increase to 25u depending on flow chart up to tid, Disp: 10 mL, Rfl: 2 .  Insulin Syringe-Needle U-100 29G X 1/2" 0.3 ML MISC, 1 each by Does not apply route 2 (two) times daily., Disp: 30 each, Rfl: 0 .  isosorbide mononitrate (IMDUR) 30 MG 24 hr tablet, TAKE 1 TABLET (30 MG TOTAL) BY MOUTH DAILY. PLEASE KEEP APPOINTMENT FOR FURTHER REFILLS! THANKS :), Disp: 90 tablet, Rfl: 3 .  lamoTRIgine (LAMICTAL) 200 MG tablet, Take 1 tablet (200 mg total) by mouth daily., Disp: 90 tablet, Rfl: 0 .  losartan (COZAAR) 100 MG tablet, Take 1 tablet (100 mg total) by mouth daily., Disp: 90 tablet, Rfl: 1 .  magic mouthwash SOLN, Take 15 mLs by mouth 3 (three) times daily as needed for mouth pain., Disp: 250 mL, Rfl: 1 .  Magnesium 500 MG CAPS, Take 1 capsule (500 mg total) by mouth 2 (two) times daily at 8 am and 10 pm., Disp: 60 capsule, Rfl: 5 .  naloxone (NARCAN) nasal spray 4 mg/0.1 mL, 4 mg (contents of 1 nasal spray) as a single dose in one nostril; may  repeat every 2 to 3 minutes in alternating nostrils until medical assistance becomes available, Disp: 1 kit, Rfl: 0 .  nitroGLYCERIN (NITROSTAT) 0.4 MG SL tablet, PLACE 1 TABLET (0.4 MG TOTAL) UNDER THE  TONGUE EVERY 5 (FIVE) MINUTES AS NEEDED FOR CHEST PAIN., Disp: 25 tablet, Rfl: PRN .  ondansetron (ZOFRAN) 4 MG tablet, TAKE 1 TABLET (4 MG TOTAL) BY MOUTH DAILY AS NEEDED., Disp: 10 tablet, Rfl: 2 .  rosuvastatin (CRESTOR) 10 MG tablet, Take 1 tablet (10 mg total) by mouth daily., Disp: 90 tablet, Rfl: 3 .  spironolactone (ALDACTONE) 25 MG tablet, Take 1 tablet (25 mg total) by mouth daily., Disp: 30 tablet, Rfl: 5  ROS  Constitutional: Denies any fever or chills Gastrointestinal: No reported hemesis, hematochezia, vomiting, or acute GI distress Musculoskeletal: Denies any acute onset joint swelling, redness, loss of ROM, or weakness Neurological: No reported episodes of acute onset apraxia, aphasia, dysarthria, agnosia, amnesia, paralysis, loss of coordination, or loss of consciousness  Allergies  Ms. Broz has No Known Allergies.  PFSH  Drug: Ms. Jeffrey  reports no history of drug use. Alcohol:  reports current alcohol use. Tobacco:  reports that she has been smoking cigarettes. She has been smoking about 1.00 pack per day. She has never used smokeless tobacco. Medical:  has a past medical history of Arthralgia of temporomandibular joint, CAD, multiple vessel, Carotid arterial disease ([place]), Depression, Diastolic dysfunction, Fatty liver disease, nonalcoholic (2585), HLD (hyperlipidemia), Labile hypertension, Obesity, PTSD (post-traumatic stress disorder), Tobacco abuse, and Type 2 diabetes mellitus [facility]) (January 2017). Surgical: Ms. Archibald  has a past surgical history that includes Cholecystectomy; Tonsillectomy; Cesarean section; Cardiac catheterization ([name], 04/18/2016); Cardiac catheterization ([name], 06/29/2016); Coronary artery bypass graft ([name], 07/06/2016); TEE without  cardioversion ([name], 07/06/2016); Endarterectomy (Right, 07/06/2016); Cardiac catheterization ([name], 08/29/2016); ABDOMINAL AORTOGRAM W/LOWER EXTREMITY ([name], 10/15/2018); and PERIPHERAL VASCULAR INTERVENTION (Left, 10/15/2018). Family: family history includes Alcohol abuse in her father; Anxiety disorder in her sister; Diabetes in her father and mother; Drug abuse in her father; Heart disease in her father; Stroke in her sister. She was adopted.  Constitutional Exam  General appearance: Well nourished, well developed, and well hydrated. In no apparent acute distress Vitals:   02/11/19 1012  BP: (!) 213/122  Pulse: 99  Temp: 98.2 F (36.8 C)  SpO2: 99%  Weight: 203 lb (92.1 kg)  Height: _0  (1.702 m)   BMI Assessment: Estimated body mass index is 31.79 kg/m as calculated from the following:   Height as of this encounter: _1  (1.702 m).   Weight as of this encounter: 203 lb (92.1 kg).  BMI interpretation table: BMI level Category Range association with higher incidence of chronic pain  <18 kg/m2 Underweight   18.5-24.9 kg/m2 Ideal body weight   25-29.9 kg/m2 Overweight Increased incidence by 20%  30-34.9 kg/m2 Obese (Class I) Increased incidence by 68%  35-39.9 kg/m2 Severe obesity (Class II) Increased incidence by 136%  >40 kg/m2 Extreme obesity (Class III) Increased incidence by 254%   Patient's current BMI Ideal Body weight  Body mass index is 31.79 kg/m. Ideal body weight: 61.6 kg (135 lb 12.9 oz) Adjusted ideal body weight: 73.8 kg (162 lb 10.9 oz)   BMI Readings from Last 4 Encounters:  02/11/19 31.79 kg/m  02/10/19 31.79 kg/m  01/27/19 32.11 kg/m  01/19/19 32.11 kg/m   Wt Readings from Last 4 Encounters:  02/11/19 203 lb (92.1 kg)  02/10/19 203 lb (92.1 kg)  01/27/19 205 lb (93 kg)  01/19/19 205 lb (93 kg)  Psych/Mental status: Alert, oriented x 3 (person, place, & time)       Eyes: PERLA Respiratory: No evidence of acute respiratory distress  Cervical Spine  Area Exam  Skin & Axial Inspection: No masses, redness, edema, swelling, or associated skin lesions.  She does have evidence of a prior carotid endarterectomy surgery on the right side. Alignment: Symmetrical Functional ROM: Diminished ROM      Stability: No instability detected Muscle Tone/Strength: Functionally intact. No obvious neuro-muscular anomalies detected. Sensory (Neurological): Unimpaired Palpation: No palpable anomalies              Upper Extremity (UE) Exam    Side: Right upper extremity  Side: Left upper extremity  Skin & Extremity Inspection: Skin color, temperature, and hair growth are WNL. No peripheral edema or cyanosis. No masses, redness, swelling, asymmetry, or associated skin lesions. No contractures.  Skin & Extremity Inspection: Skin color, temperature, and hair growth are WNL. No peripheral edema or cyanosis. No masses, redness, swelling, asymmetry, or associated skin lesions. No contractures.  Functional ROM: Unrestricted ROM          Functional ROM: Unrestricted ROM          Muscle Tone/Strength: Functionally intact. No obvious neuro-muscular anomalies detected.  Muscle Tone/Strength: Functionally intact. No obvious neuro-muscular anomalies detected.  Sensory (Neurological): Unimpaired          Sensory (Neurological): Unimpaired          Palpation: No palpable anomalies              Palpation: No palpable anomalies              Provocative Test(s):  Phalen's test: deferred Tinel's test: deferred Apley's scratch test (touch opposite shoulder):  Action 1 (Across chest): deferred Action 2 (Overhead): deferred Action 3 (LB reach): deferred   Provocative Test(s):  Phalen's test: deferred Tinel's test: deferred Apley's scratch test (touch opposite shoulder):  Action 1 (Across chest): deferred Action 2 (Overhead): deferred Action 3 (LB reach): deferred    Thoracic Spine Area Exam  Skin & Axial Inspection: No masses, redness, or swelling Alignment:  Symmetrical Functional ROM: Unrestricted ROM Stability: No instability detected Muscle Tone/Strength: Functionally intact. No obvious neuro-muscular anomalies detected. Sensory (Neurological): Unimpaired Muscle strength & Tone: No palpable anomalies  Lumbar Spine Area Exam  Skin & Axial Inspection: No masses, redness, or swelling Alignment: Symmetrical Functional ROM: Unrestricted ROM       Stability: No instability detected Muscle Tone/Strength: Functionally intact. No obvious neuro-muscular anomalies detected. Sensory (Neurological): Unimpaired Palpation: No palpable anomalies       Provocative Tests: Hyperextension/rotation test: deferred today       Lumbar quadrant test (Kemp's test): deferred today       Lateral bending test: deferred today       Patrick's Maneuver: deferred today                   FABER* test: deferred today                   S-I anterior distraction/compression test: deferred today         S-I lateral compression test: deferred today         S-I Thigh-thrust test: deferred today         S-I Gaenslen's test: deferred today         *(Flexion, ABduction and External Rotation)  Gait & Posture Assessment  Ambulation: Unassisted Gait: Relatively normal for age and body habitus Posture: WNL   Lower Extremity Exam    Side: Right lower extremity  Side: Left lower extremity  Stability: No instability observed  Stability: No instability observed          Skin & Extremity Inspection: Skin color, temperature, and hair growth are WNL. No peripheral edema or cyanosis. No masses, redness, swelling, asymmetry, or associated skin lesions. No contractures.  Skin & Extremity Inspection: Skin color, temperature, and hair growth are WNL. No peripheral edema or cyanosis. No masses, redness, swelling, asymmetry, or associated skin lesions. No contractures.  Functional ROM: Unrestricted ROM                  Functional ROM: Unrestricted ROM                  Muscle  Tone/Strength: Functionally intact. No obvious neuro-muscular anomalies detected.  Muscle Tone/Strength: Functionally intact. No obvious neuro-muscular anomalies detected.  Sensory (Neurological): Unimpaired        Sensory (Neurological): Unimpaired        DTR: Patellar: deferred today Achilles: deferred today Plantar: deferred today  DTR: Patellar: deferred today Achilles: deferred today Plantar: deferred today  Palpation: No palpable anomalies  Palpation: No palpable anomalies   Assessment   Status Diagnosis  Recurring Recurring Recurring 1. Chronic headaches (Primary Area of Pain) (Right)   2. Atypical facial pain   3. Chronic ear pain (Right)   4. Chronic jaw pain (Right)   5. Geniculate Neuralgia (Right)   6. Neurogenic pain      Updated Problems: Problem  Vitamin D Deficiency    Plan of Care  Pharmacotherapy (Medications Ordered): Meds ordered this encounter  Medications  . gabapentin (NEURONTIN) 100 MG capsule    Sig: Take 1-3 capsules (100-300 mg total) by mouth 4 (four) times daily for 30 days. Follow written titration schedule.    Dispense:  360 capsule    Refill:  0    Do not place medication on "Automatic Refill". Fill one day early if pharmacy is closed on scheduled refill date.   Medications administered today: Kiesha Ensey. [place] had no medications administered during this visit.   Procedure Orders     FACIAL NERVE BLOCK Lab Orders  No laboratory test(s) ordered today   Imaging Orders  No imaging studies ordered today   Referral Orders  No referral(s) requested today   Interventional management options: Planned, scheduled, and/or pending:   NOTE: PLAVIX ANTICOAGULATION (Stop: 7-10 days pre-procedure; Restart: 2 hours post-proc.) Diagnostic right-sided facial/Geniculate nerve block #2 under fluoroscopic guidance and IV sedation   Considering:   Diagnostic right sided facial nerve block  Diagnostic right-sided occipital nerve block  Possible  right-sided occipital nerve RFA  Diagnostic Botox injections    Palliative PRN treatment(s):   None at this time   Provider-requested follow-up: Return for Procedure (w/ sedation): (R) Facial NB #2 (Geniculate Nerve Block), (Blood-thinner Protocol).  Future Appointments  Date Time Provider [place]  02/18/2019  8:40 AM Arfeen, Arlyce Harman, MD [facility] None  03/17/2019  8:45 AM Bo Merino, MD [facility] None  03/30/2019  9:20 AM Lucille Passy, MD [id number]  04/07/2019  2:30 PM Bo Merino, MD [facility] None  05/11/2019  9:30 AM [facility] [name] [facility]  05/11/2019 11:30 AM [facility] Marcello Moores [name] [facility]   Primary Care Physician: Lucille Passy, MD Location: [facility] Note by: Gaspar Cola, MD Date: 02/11/2019; Time: 11:24 AM

## 2019-02-11 NOTE — Patient Instructions (Addendum)
____________________________________________________________________________________________  Preparing for Procedure with Sedation  Instructions: . Oral Intake: Do not eat or drink anything for at least 8 hours prior to your procedure. . Transportation: Public transportation is not allowed. Bring an adult driver. The driver must be physically present in our waiting room before any procedure can be started. . Physical Assistance: Bring an adult physically capable of assisting you, in the event you need help. This adult should keep you company at home for at least 6 hours after the procedure. . Blood Pressure Medicine: Take your blood pressure medicine with a sip of water the morning of the procedure. . Blood thinners: Notify our staff if you are taking any blood thinners. Depending on which one you take, there will be specific instructions on how and when to stop it. . Diabetics on insulin: Notify the staff so that you can be scheduled 1st case in the morning. If your diabetes requires high dose insulin, take only  of your normal insulin dose the morning of the procedure and notify the staff that you have done so. . Preventing infections: Shower with an antibacterial soap the morning of your procedure. . Build-up your immune system: Take 1000 mg of Vitamin C with every meal (3 times a day) the day prior to your procedure. . Antibiotics: Inform the staff if you have a condition or reason that requires you to take antibiotics before dental procedures. . Pregnancy: If you are pregnant, call and cancel the procedure. . Sickness: If you have a cold, fever, or any active infections, call and cancel the procedure. . Arrival: You must be in the facility at least 30 minutes prior to your scheduled procedure. . Children: Do not bring children with you. . Dress appropriately: Bring dark clothing that you would not mind if they get stained. . Valuables: Do not bring any jewelry or valuables.  Procedure  appointments are reserved for interventional treatments only. . No Prescription Refills. . No medication changes will be discussed during procedure appointments. . No disability issues will be discussed.  Reasons to call and reschedule or cancel your procedure: (Following these recommendations will minimize the risk of a serious complication.) . Surgeries: Avoid having procedures within 2 weeks of any surgery. (Avoid for 2 weeks before or after any surgery). . Flu Shots: Avoid having procedures within 2 weeks of a flu shots or . (Avoid for 2 weeks before or after immunizations). . Barium: Avoid having a procedure within 7-10 days after having had a radiological study involving the use of radiological contrast. (Myelograms, Barium swallow or enema study). . Heart attacks: Avoid any elective procedures or surgeries for the initial 6 months after a "Myocardial Infarction" (Heart Attack). . Blood thinners: It is imperative that you stop these medications before procedures. Let us know if you if you take any blood thinner.  . Infection: Avoid procedures during or within two weeks of an infection (including chest colds or gastrointestinal problems). Symptoms associated with infections include: Localized redness, fever, chills, night sweats or profuse sweating, burning sensation when voiding, cough, congestion, stuffiness, runny nose, sore throat, diarrhea, nausea, vomiting, cold or Flu symptoms, recent or current infections. It is specially important if the infection is over the area that we intend to treat. . Heart and lung problems: Symptoms that may suggest an active cardiopulmonary problem include: cough, chest pain, breathing difficulties or shortness of breath, dizziness, ankle swelling, uncontrolled high or unusually low blood pressure, and/or palpitations. If you are experiencing any of these symptoms, cancel   your procedure and contact your primary care physician for an evaluation.  Remember:   Regular Business hours are:  Monday to Thursday 8:00 AM to 4:00 PM  Provider's Schedule: Milinda Pointer, MD:  Procedure days: Tuesday and Thursday 7:30 AM to 4:00 PM  Gillis Santa, MD:  Procedure days: Monday and Wednesday 7:30 AM to 4:00 PM ____________________________________________________________________________________________   ____________________________________________________________________________________________  Gabapentin Titration  Medication used: Gabapentin (Generic Name) or Neurontin (Brand Name) 100 mg tablets/capsules  Reasons to stop increasing the dose:  Reason 1: You get good relief of symptoms, in which case there is no need to increase the daily dose any further.    Reason 2: You develop some side effects, such as sleeping all of the time, difficulty concentrating, or becoming disoriented, in which case you need to go down on the dose, to the prior level, where you were not experiencing any side effects. Stay on that dose longer, to allow more time for your body to get use it, before attempting to increase it again.   Steps to increase medication: Step 1: Start by taking 1 (one) tablet at bedtime x 7 (seven) days.  Step 2: Increase dose to 2 (two) tablets at bedtime. Stay on this dose x 7 (seven) days.  Step 3: Next increase it to 3 (three) tablets at bedtime. Stay on this dose x another 7 (seven) days.  Step 4: Next, add 1 (one) tablet at noon with lunch. Continue this dose x another 7 (seven) days.  Step 5: Add 1 (one) tablet in the afternoon with dinner. Stay on this dose x another 7 (seven) days.  Step 6: At this point you should be taking the medicine 4 (four) times a day. This daily regimen of taking the medicine 4 (four) times a day, will be maintained from now on. You should not take any doses any sooner than every 6 (six) hours.  Step 7: After 7 (seven) days of taking 3 (three) tablet at bedtime, 1 (one) tablet at noon, 1 (one) tablet in  the afternoon, and 1 (one) tablet in the morning, begin taking 2 (two) tablets at noon with lunch. Stay on this dose x another 7 (seven) days.   Step 8: After 7 (seven) days of taking 3 (three) tablet at bedtime, 2 (two) tablets at noon, 1 (one) tablet in the afternoon, and 1 (one) tablet in the morning, begin taking 2 (two) tablets in the afternoon with dinner. Stay on this dose x another 7 (seven) days.   Step 9: After 7 (seven) days of taking 3 (three) tablet at bedtime, 2 (two) tablets at noon, 2 (two) tablets in the afternoon, and 1 (one) tablet in the morning, begin taking 2 (two) tablets in the morning with breakfast. Stay on this dose x another 7 (seven) days. At this point you should be taking the medicine 4 (four) times a day, or about every 6 (six) hours. This daily regimen of taking the medicine 4 (four) times a day, will be maintained from now on. You should not take any doses any sooner than every 6 (six) hours.  Step 10: After 7 (seven) days of taking 3 (three) tablet at bedtime, 2 (two) tablets at noon, 2 (two) tablets in the afternoon, and 2 (two) tablets in the morning, begin taking 3 (three) tablets at noon with lunch. Stay on this dose x another 7 (seven) days.   Step 11: After 7 (seven) days of taking 3 (three) tablet at bedtime, 3 (three) tablets  at noon, 2 (two) tablets in the afternoon, and 2 (two) tablets in the morning, begin taking 3 (three) tablets in the afternoon with dinner. Stay on this dose x another 7 (seven) days.   Step 12: After 7 (seven) days of taking 3 (three) tablet at bedtime, 3 (three) tablets at noon, 3 (three) tablets in the afternoon, and 2 (two) tablet in the morning, begin taking 3 (three) tablets in the morning with breakfast. Stay on this dose x another 7 (seven) days. At this point you should be taking the medicine 4 (four) times a day, or about every 6 (six) hours. This daily regimen of taking the medicine 4 (four) times a day, will be maintained from now  on.   Endpoint: Once you have reached the maximum dose you can tolerate without side-effects, contact your physician so as to evaluate the results of the regimen.   Questions: Feel free to contact us for any questions or problems at [phone] ____________________________________________________________________________________________   ____________________________________________________________________________________________  Blood Thinners  Recommended Time Interval Before and After Neuraxial Block or Catheter Removal  Drug (Generic) Brand Name Time Before Time After Comments  Abciximab Reopro 15 days 2 hours   Alteplase Activase 10 days 10 days   Apixaban Eliquis 3 days 6 hours   Aspirin > 325 mg Goody Powders/Excedrin 11 days  (Usually not stopped)  Aspirin ? 81 mg  7 days  (Usually not stopped)  Cholesterol Medication Lipitor 4 days    Cilostazol Pletal 3 days 5 hours   Clopidogrel Plavix 7-10 days 2 hours   Dabigatran Pradaxa 5 days 6 hours   Delteparin Fragmin 24 hours 4 hours   Dipyridamole + ASA Aggrenox 11days 2 hours   Enoxaparin  Lovenox 24 hours 4 hours   Eptifibatide Integrillin 8 hours 2 hours   Fish oil  4 days    Fondaparinux  Arixtra 72 hours 12 hours   Garlic supplements  7 days    Ginkgo biloba  36 hours    Ginseng  24 hours    Heparin (IV)  4 hours 2 hours   Heparin ([place])  12 hours 2 hours   Hydroxychloroquine Plaquenil 11 days    LMW Heparin  24 hours    LMWH  24 hours    NSAIDs  3 days  (Usually not stopped)  Prasugrel Effient 7-10 days 6 hours   Reteplase Retavase 10 days 10 days   Rivaroxaban Xarelto 3 days 6 hours   Streptokinase Streptase 10 days 10 days   Tenecteplase TNKase 10 days 10 days   Thrombolytics  10 days  10 days Avoid x 10 days after inj.  Ticagrelor Brilinta 5-7 days 6 hours   Ticlodipine Ticlid 10-14 days 2 hours   Tinzaparin Innohep 24 hours 4 hours   Tirofiban Aggrastat 8 hours 2 hours   Vitamin E  4 days    Warfarin  Coumadin 5 days 2 hours   ____________________________________________________________________________________________

## 2019-02-18 ENCOUNTER — Ambulatory Visit (INDEPENDENT_AMBULATORY_CARE_PROVIDER_SITE_OTHER): Payer: BLUE CROSS/BLUE SHIELD | Admitting: Psychiatry

## 2019-02-18 ENCOUNTER — Encounter (HOSPITAL_COMMUNITY): Payer: Self-pay | Admitting: Psychiatry

## 2019-02-18 DIAGNOSIS — F331 Major depressive disorder, recurrent, moderate: Secondary | ICD-10-CM

## 2019-02-18 DIAGNOSIS — F410 Panic disorder [episodic paroxysmal anxiety] without agoraphobia: Secondary | ICD-10-CM

## 2019-02-18 DIAGNOSIS — F4310 Post-traumatic stress disorder, unspecified: Secondary | ICD-10-CM

## 2019-02-18 MED ORDER — BENZTROPINE MESYLATE 0.5 MG PO TABS: 0.5000 mg | ORAL_TABLET | Freq: Every day | ORAL | 0 refills | Status: DC

## 2019-02-18 MED ORDER — LAMOTRIGINE 200 MG PO TABS: 200.0000 mg | ORAL_TABLET | Freq: Every day | ORAL | 0 refills | Status: DC

## 2019-02-18 MED ORDER — CLONAZEPAM 0.5 MG PO TABS: ORAL_TABLET | ORAL | 2 refills | Status: DC

## 2019-02-18 MED ORDER — CHLORPROMAZINE HCL 50 MG PO TABS: 50.0000 mg | ORAL_TABLET | Freq: Every day | ORAL | 0 refills | Status: DC

## 2019-02-18 MED ORDER — FLUOXETINE HCL 60 MG PO TABS: 60.0000 mg | ORAL_TABLET | Freq: Every day | ORAL | 0 refills | Status: DC
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
## 2019-02-19 ENCOUNTER — Ambulatory Visit (HOSPITAL_BASED_OUTPATIENT_CLINIC_OR_DEPARTMENT_OTHER): Payer: BLUE CROSS/BLUE SHIELD | Admitting: Pain Medicine

## 2019-02-19 ENCOUNTER — Ambulatory Visit
Admission: RE | Admit: 2019-02-19 | Discharge: 2019-02-19 | Disposition: A | Discharge status: Home / Self Care | Payer: BLUE CROSS/BLUE SHIELD | Source: Ambulatory Visit | Attending: Pain Medicine | Admitting: Pain Medicine

## 2019-02-19 ENCOUNTER — Encounter: Payer: Self-pay | Admitting: Pain Medicine

## 2019-02-19 ENCOUNTER — Other Ambulatory Visit: Payer: Self-pay

## 2019-02-19 VITALS — BP 116/81 | HR 100 | Temp 98.6°F | Resp 21 | Ht 67.0 in | Wt 201.0 lb

## 2019-02-19 DIAGNOSIS — R51 Headache: Secondary | ICD-10-CM | POA: Insufficient documentation

## 2019-02-19 DIAGNOSIS — B0221 Postherpetic geniculate ganglionitis: Secondary | ICD-10-CM | POA: Insufficient documentation

## 2019-02-19 DIAGNOSIS — G501 Atypical facial pain: Secondary | ICD-10-CM | POA: Insufficient documentation

## 2019-02-19 DIAGNOSIS — R6884 Jaw pain: Secondary | ICD-10-CM | POA: Diagnosis not present

## 2019-02-19 DIAGNOSIS — G8929 Other chronic pain: Secondary | ICD-10-CM | POA: Diagnosis not present

## 2019-02-19 DIAGNOSIS — H9201 Otalgia, right ear: Secondary | ICD-10-CM

## 2019-02-19 MED ORDER — ROPIVACAINE HCL 2 MG/ML IJ SOLN
3.0000 mL | Freq: Once | INTRAMUSCULAR | Status: AC
  Administered 2019-02-19: 3 mL via EPIDURAL

## 2019-02-19 MED ORDER — IOPAMIDOL (ISOVUE-M 200) INJECTION 41%
5.0000 mL | Freq: Once | INTRAMUSCULAR | Status: AC
  Administered 2019-02-19: 10 mL
  Filled 2019-02-19: qty 10

## 2019-02-19 MED ORDER — MIDAZOLAM HCL 5 MG/5ML IJ SOLN
1.0000 mg | INTRAMUSCULAR | Status: DC | PRN
  Administered 2019-02-19: 3 mg via INTRAVENOUS

## 2019-02-19 MED ORDER — LIDOCAINE HCL 2 % IJ SOLN
INTRAMUSCULAR | Status: AC
  Filled 2019-02-19: qty 20

## 2019-02-19 MED ORDER — MIDAZOLAM HCL 5 MG/5ML IJ SOLN
INTRAMUSCULAR | Status: AC
  Filled 2019-02-19: qty 5

## 2019-02-19 MED ORDER — FENTANYL CITRATE (PF) 100 MCG/2ML IJ SOLN
25.0000 ug | INTRAMUSCULAR | Status: DC | PRN
  Administered 2019-02-19: 100 ug via INTRAVENOUS

## 2019-02-19 MED ORDER — FENTANYL CITRATE (PF) 100 MCG/2ML IJ SOLN
INTRAMUSCULAR | Status: AC
  Filled 2019-02-19: qty 2

## 2019-02-19 MED ORDER — DEXAMETHASONE SODIUM PHOSPHATE 10 MG/ML IJ SOLN
INTRAMUSCULAR | Status: AC
  Filled 2019-02-19: qty 1

## 2019-02-19 MED ORDER — DEXAMETHASONE SODIUM PHOSPHATE 10 MG/ML IJ SOLN
10.0000 mg | Freq: Once | INTRAMUSCULAR | Status: AC
  Administered 2019-02-19: 10 mg

## 2019-02-19 MED ORDER — ROPIVACAINE HCL 2 MG/ML IJ SOLN
INTRAMUSCULAR | Status: AC
  Filled 2019-02-19: qty 10

## 2019-02-19 MED ORDER — LACTATED RINGERS IV SOLN
1000.0000 mL | Freq: Once | INTRAVENOUS | Status: AC
  Administered 2019-02-19: 1000 mL via INTRAVENOUS

## 2019-02-19 MED ORDER — LIDOCAINE HCL 2 % IJ SOLN
5.0000 mL | Freq: Once | INTRAMUSCULAR | Status: AC
  Administered 2019-02-19: 400 mg

## 2019-02-19 NOTE — Progress Notes (Signed)
Safety precautions to be maintained throughout the outpatient stay will include: orient to surroundings, keep bed in low position, maintain call bell within reach at all times, provide assistance with transfer out of bed and ambulation.  

## 2019-02-19 NOTE — Progress Notes (Signed)
Patient's Name: Erika [name]  MRN: [id number]  Referring Provider: Milinda Pointer, MD  DOB: [birth year]  PCP: Lucille Passy, MD  DOS: 02/19/2019  Note by: Gaspar Cola, MD  Service setting: Ambulatory outpatient  Specialty: Interventional Pain Management  Patient type: Established  Location: [facility] (AMB) Pain Management Facility  Visit type: Interventional Procedure   Primary Reason for Visit: Interventional Pain Management Treatment. CC: Headache  Procedure:          Anesthesia, Analgesia, Anxiolysis:  Type: Diagnostic Facial nerve block  #2  Region: Face Level: Anterior border of mastoid process, immediately below the external auditory meatus and at the level of the middle of the ramus of the mandible   Laterality: Right-Sided  Type: Moderate (Conscious) Sedation combined with Local Anesthesia Indication(s): Analgesia and Anxiety Route: Intravenous (IV) IV Access: Secured Sedation: Meaningful verbal contact was maintained at all times during the procedure  Local Anesthetic: Lidocaine 1-2%  Position: Left lateral decubitus   Indications: 1. Geniculate Neuralgia (Right)   2. Atypical facial pain   3. Chronic jaw pain (Right)   4. Chronic ear pain (Right)   5. Chronic headaches (Primary Area of Pain) (Right)    Pain Score: Pre-procedure: 10-Worst pain ever/10 Post-procedure: 1 /10  Pre-op Assessment:  Erika [name] is a 48 y.o. (year old), female patient, seen today for interventional treatment. She  has a past surgical history that includes Cholecystectomy; Tonsillectomy; Cesarean section; Cardiac catheterization ([name], 04/18/2016); Cardiac catheterization ([name], 06/29/2016); Coronary artery bypass graft ([name], 07/06/2016); TEE without cardioversion ([name], 07/06/2016); Endarterectomy (Right, 07/06/2016); Cardiac catheterization ([name], 08/29/2016); ABDOMINAL AORTOGRAM W/LOWER EXTREMITY ([name], 10/15/2018); and PERIPHERAL VASCULAR INTERVENTION (Left, 10/15/2018). Erika [name] has a  current medication list which includes the following prescription(s): alirocumab, aspirin ec, benztropine, carvedilol, chlorpromazine, vitamin d3, clonazepam, clopidogrel, cyclobenzaprine, ergocalciferol, fluoxetine hcl, gabapentin, insulin glargine, insulin nph-regular human, insulin syringe-needle u-100, isosorbide mononitrate, lamotrigine, losartan, magic mouthwash, magnesium, naloxone, nitroglycerin, ondansetron, rosuvastatin, spironolactone, albuterol, and calcium carbonate, and the following Facility-Administered Medications: fentanyl and midazolam. Her primarily concern today is the Headache  Initial Vital Signs:  Pulse/HCG Rate: (!) 118ECG Heart Rate: (!) 105 Temp: 97.7 F (36.5 C) Resp: 16 BP: 126/81 SpO2: 96 %  BMI: Estimated body mass index is 31.48 kg/m as calculated from the following:   Height as of this encounter: 5\' 7"  (1.702 m).   Weight as of this encounter: 201 lb (91.2 kg).  Risk Assessment: Allergies: Reviewed. She has No Known Allergies.  Allergy Precautions: None required Coagulopathies: Reviewed. None identified.  Blood-thinner therapy: None at this time Active Infection(s): Reviewed. None identified. Erika [name] is afebrile  Site Confirmation: Erika [name] was asked to confirm the procedure and laterality before marking the site Procedure checklist: Completed Consent: Before the procedure and under the influence of no sedative(s), amnesic(s), or anxiolytics, the patient was informed of the treatment options, risks and possible complications. To fulfill our ethical and legal obligations, as recommended by the American Medical Association's Code of Ethics, I have informed the patient of my clinical impression; the nature and purpose of the treatment or procedure; the risks, benefits, and possible complications of the intervention; the alternatives, including doing nothing; the risk(s) and benefit(s) of the alternative treatment(s) or procedure(s); and the risk(s) and  benefit(s) of doing nothing. The patient was provided information about the general risks and possible complications associated with the procedure. These may include, but are not limited to: failure to achieve desired goals, infection, bleeding, organ or nerve damage, allergic reactions, paralysis,  and death. In addition, the patient was informed of those risks and complications associated to the procedure, such as failure to decrease pain; infection; bleeding; organ or nerve damage with subsequent damage to sensory, motor, and/or autonomic systems, resulting in permanent pain, numbness, and/or weakness of one or several areas of the body; allergic reactions; (i.e.: anaphylactic reaction); and/or death. Furthermore, the patient was informed of those risks and complications associated with the medications. These include, but are not limited to: allergic reactions (i.e.: anaphylactic or anaphylactoid reaction(s)); adrenal axis suppression; blood sugar elevation that in diabetics may result in ketoacidosis or comma; water retention that in patients with history of congestive heart failure may result in shortness of breath, pulmonary edema, and decompensation with resultant heart failure; weight gain; swelling or edema; medication-induced neural toxicity; particulate matter embolism and blood vessel occlusion with resultant organ, and/or nervous system infarction; and/or aseptic necrosis of one or more joints. Finally, the patient was informed that Medicine is not an exact science; therefore, there is also the possibility of unforeseen or unpredictable risks and/or possible complications that may result in a catastrophic outcome. The patient indicated having understood very clearly. We have given the patient no guarantees and we have made no promises. Enough time was given to the patient to ask questions, all of which were answered to the patient's satisfaction. Erika [name] has indicated that she wanted to  continue with the procedure. Attestation: I, the ordering provider, attest that I have discussed with the patient the benefits, risks, side-effects, alternatives, likelihood of achieving goals, and potential problems during recovery for the procedure that I have provided informed consent. Date  Time: 02/19/2019  7:53 AM  Pre-Procedure Preparation:  Monitoring: As per clinic protocol. Respiration, ETCO2, SpO2, BP, heart rate and rhythm monitor placed and checked for adequate function Safety Precautions: Patient was assessed for positional comfort and pressure points before starting the procedure. Time-out: I initiated and conducted the "Time-out" before starting the procedure, as per protocol. The patient was asked to participate by confirming the accuracy of the "Time Out" information. Verification of the correct person, site, and procedure were performed and confirmed by me, the nursing staff, and the patient. "Time-out" conducted as per Joint Commission's Universal Protocol (UP.01.01.01). Time: 68  Description of Procedure:          Target Area: Area where the facial nerve exits the stylomastoid foramen, just anterior to the periosteum of the mastoid process Approach: Anterior approach Area Prepped: Entire side of the face, including tragus, earlobe, and mid-upper neck area Prepping solution: ChloraPrep (2% chlorhexidine gluconate and 70% isopropyl alcohol) Safety Precautions: Aspiration looking for blood return was conducted prior to all injections. At no point did we inject any substances, as a needle was being advanced. No attempts were made at seeking any paresthesias. Safe injection practices and needle disposal techniques used. Medications properly checked for expiration dates. SDV (single dose vial) medications used. Description of the Procedure: Protocol guidelines were followed. The target area was identified and the area prepped in the usual manner. Skin & deeper tissues infiltrated  with local anesthetic. Appropriate amount of time allowed to pass for local anesthetics to take effect. The procedure needles were then advanced to the target area. Proper needle placement secured. Negative aspiration confirmed. Solution injected in intermittent fashion, asking for systemic symptoms every 0.5cc of injectate. The needles were then removed and the area cleansed, making sure to leave some of the prepping solution back to take advantage of its long term bactericidal  properties.  Vitals:   02/19/19 0900 02/19/19 0906 02/19/19 0918 02/19/19 0927  BP: 96/65 121/83 116/83 116/81  Pulse: 100     Resp:  18 16 (!) 21  Temp:  98.4 F (36.9 C)  98.6 F (37 C)  TempSrc:  Temporal  Temporal  SpO2: 98% 94% 94% 97%  Weight:      Height:        Start Time: 0840 hrs. End Time: 0900 hrs. Materials:  Needle(s) Type: Spinal Needle Gauge: 22G Length: 3.5-in Medication(s): Please see orders for medications and dosing details.  Imaging Guidance (Non-Spinal):          Type of Imaging Technique: Fluoroscopy Guidance (Non-Spinal) Indication(s): Assistance in needle guidance and placement for procedures requiring needle placement in or near specific anatomical locations not easily accessible without such assistance. Exposure Time: Please see nurses notes. Contrast: None used. Fluoroscopic Guidance: I was personally present during the use of fluoroscopy. "Tunnel Vision Technique" used to obtain the best possible view of the target area. Parallax error corrected before commencing the procedure. "Direction-depth-direction" technique used to introduce the needle under continuous pulsed fluoroscopy. Once target was reached, antero-posterior, oblique, and lateral fluoroscopic projection used confirm needle placement in all planes. Images permanently stored in EMR. Interpretation: No contrast injected. I personally interpreted the imaging intraoperatively. Adequate needle placement confirmed in multiple  planes. Permanent images saved into the patient's record.  Antibiotic Prophylaxis:   Anti-infectives (From admission, onward)   None     Indication(s): None identified  Post-operative Assessment:  Post-procedure Vital Signs:  Pulse/HCG Rate: 100(!) 108 Temp: 98.6 F (37 C) Resp: (!) 21 BP: 116/81 SpO2: 97 %  EBL: None  Complications: No immediate post-treatment complications observed by team, or reported by patient.  Note: The patient tolerated the entire procedure well. A repeat set of vitals were taken after the procedure and the patient was kept under observation following institutional policy, for this type of procedure. Post-procedural neurological assessment was performed, showing return to baseline, prior to discharge. The patient was provided with post-procedure discharge instructions, including a section on how to identify potential problems. Should any problems arise concerning this procedure, the patient was given instructions to immediately contact us, at any time, without hesitation. In any case, we plan to contact the patient by telephone for a follow-up status report regarding this interventional procedure.  Comments:  No additional relevant information.  Plan of Care   Imaging Orders     DG C-Arm 1-60 Min-No Report Procedure Orders    No procedure(s) ordered today    Medications ordered for procedure: Meds ordered this encounter  Medications  . iopamidol (ISOVUE-M) 41 % intrathecal injection 5 mL    Must be Myelogram-compatible. If not available, you may substitute with a water-soluble, non-ionic, hypoallergenic, myelogram-compatible radiological contrast medium.  Marland Kitchen lidocaine (XYLOCAINE) 2 % (with pres) injection 100 mg  . ropivacaine (PF) 2 mg/mL (0.2%) (NAROPIN) injection 3 mL  . dexamethasone (DECADRON) injection 10 mg  . midazolam (VERSED) 5 MG/5ML injection 1-2 mg    Make sure Flumazenil is available in the pyxis when using this medication. If  oversedation occurs, administer 0.2 mg IV over 15 sec. If after 45 sec no response, administer 0.2 mg again over 1 min; may repeat at 1 min intervals; not to exceed 4 doses (1 mg)  . fentaNYL (SUBLIMAZE) injection 25-50 mcg    Make sure Narcan is available in the pyxis when using this medication. In the event of respiratory depression (  RR< 8/min): Titrate NARCAN (naloxone) in increments of 0.1 to 0.2 mg IV at 2-3 minute intervals, until desired degree of reversal.  . lactated ringers infusion 1,000 mL   Medications administered: We administered iopamidol, lidocaine, ropivacaine (PF) 2 mg/mL (0.2%), dexamethasone, midazolam, fentaNYL, and lactated ringers.  See the medical record for exact dosing, route, and time of administration.  Disposition: Discharge home  Discharge Date & Time: 02/19/2019; 0940 hrs.   Physician-requested Follow-up: Return for PPE (2 wks), w/ Dr. Dossie Arbour.  Future Appointments  Date Time Provider [place]  03/04/2019  9:15 AM Milinda Pointer, MD [facility] None  03/17/2019  8:45 AM Bo Merino, MD [facility] None  03/30/2019  9:20 AM Lucille Passy, MD [facility]  04/07/2019  2:30 PM Bo Merino, MD [facility] None  05/11/2019  9:30 AM [facility] [name] [facility]  05/11/2019 11:30 AM [facility] [name] [facility]  05/21/2019  8:40 AM Arfeen, Arlyce Harman, MD [facility] None   Primary Care Physician: Lucille Passy, MD Location: [facility] Note by: Gaspar Cola, MD Date: 02/19/2019; Time: 10:23 AM  Disclaimer:  Medicine is not an exact science. The only guarantee in medicine is that nothing is guaranteed. It is important to note that the decision to proceed with this intervention was based on the information collected from the patient. The Data and conclusions were drawn from the patient's questionnaire, the interview, and the physical examination. Because the information was provided in large part by the patient, it  cannot be guaranteed that it has not been purposely or unconsciously manipulated. Every effort has been made to obtain as much relevant data as possible for this evaluation. It is important to note that the conclusions that lead to this procedure are derived in large part from the available data. Always take into account that the treatment will also be dependent on availability of resources and existing treatment guidelines, considered by other Pain Management Practitioners as being common knowledge and practice, at the time of the intervention. For Medico-Legal purposes, it is also important to point out that variation in procedural techniques and pharmacological choices are the acceptable norm. The indications, contraindications, technique, and results of the above procedure should only be interpreted and judged by a Board-Certified Interventional Pain Specialist with extensive familiarity and expertise in the same exact procedure and technique.

## 2019-02-19 NOTE — Patient Instructions (Addendum)
____________________________________________________________________________________________  Post-Procedure Discharge Instructions  Instructions:  Apply ice:   Purpose: This will minimize any swelling and discomfort after procedure.   When: Day of procedure, as soon as you get home.  How: Fill a plastic sandwich bag with crushed ice. Cover it with a small towel and apply to injection site.  How long: (15 min on, 15 min off) Apply for 15 minutes then remove x 15 minutes.  Repeat sequence on day of procedure, until you go to bed.  Apply heat:   Purpose: To treat any soreness and discomfort from the procedure.  When: Starting the next day after the procedure.  How: Apply heat to procedure site starting the day following the procedure.  How long: May continue to repeat daily, until discomfort goes away.  Food intake: Start with clear liquids (like water) and advance to regular food, as tolerated.   Physical activities: Keep activities to a minimum for the first 8 hours after the procedure. After that, then as tolerated.  Driving: If you have received any sedation, be responsible and do not drive. You are not allowed to drive for 24 hours after having sedation.  Blood thinner: (Applies only to those taking blood thinners) You may restart your blood thinner 6 hours after your procedure.  Insulin: (Applies only to Diabetic patients taking insulin) As soon as you can eat, you may resume your normal dosing schedule.  Infection prevention: Keep procedure site clean and dry. Shower daily and clean area with soap and water.  Post-procedure Pain Diary: Extremely important that this be done correctly and accurately. Recorded information will be used to determine the next step in treatment. For the purpose of accuracy, follow these rules:  Evaluate only the area treated. Do not report or include pain from an untreated area. For the purpose of this evaluation, ignore all other areas of pain,  except for the treated area.  After your procedure, avoid taking a long nap and attempting to complete the pain diary after you wake up. Instead, set your alarm clock to go off every hour, on the hour, for the initial 8 hours after the procedure. Document the duration of the numbing medicine, and the relief you are getting from it.  Do not go to sleep and attempt to complete it later. It will not be accurate. If you received sedation, it is likely that you were given a medication that may cause amnesia. Because of this, completing the diary at a later time may cause the information to be inaccurate. This information is needed to plan your care.  Follow-up appointment: Keep your post-procedure follow-up evaluation appointment after the procedure (usually 2 weeks for most procedures, 6 weeks for radiofrequencies). DO NOT FORGET to bring you pain diary with you.   Expect: (What should I expect to see with my procedure?)  From numbing medicine (AKA: Local Anesthetics): Numbness or decrease in pain. You may also experience some weakness, which if present, could last for the duration of the local anesthetic.  Onset: Full effect within 15 minutes of injected.  Duration: It will depend on the type of local anesthetic used. On the average, 1 to 8 hours.   From steroids (Applies only if steroids were used): Decrease in swelling or inflammation. Once inflammation is improved, relief of the pain will follow.  Onset of benefits: Depends on the amount of swelling present. The more swelling, the longer it will take for the benefits to be seen. In some cases, up to 10 days.    Duration: Steroids will stay in the system x 2 weeks. Duration of benefits will depend on multiple posibilities including persistent irritating factors.  Side-effects: If present, they may typically last 2 weeks (the duration of the steroids).  Frequent: Cramps (if they occur, drink Gatorade and take over-the-counter Magnesium 450-500 mg  once to twice a day); water retention with temporary weight gain; increases in blood sugar; decreased immune system response; increased appetite.  Occasional: Facial flushing (red, warm cheeks); mood swings; menstrual changes.  Uncommon: Long-term decrease or suppression of natural hormones; bone thinning. (These are more common with higher doses or more frequent use. This is why we prefer that our patients avoid having any injection therapies in other practices.)   Very Rare: Severe mood changes; psychosis; aseptic necrosis.  From procedure: Some discomfort is to be expected once the numbing medicine wears off. This should be minimal if ice and heat are applied as instructed.  Call if: (When should I call?)  You experience numbness and weakness that gets worse with time, as opposed to wearing off.  New onset bowel or bladder incontinence. (Applies only to procedures done in the spine)  Emergency Numbers:  Durning business hours (Monday - Thursday, 8:00 AM - 4:00 PM) (Friday, 9:00 AM - 12:00 Noon): [phone]  After hours: [phone]  NOTE: If you are having a problem and are unable connect with, or to talk to a provider, then go to your nearest urgent care or emergency department. If the problem is serious and urgent, please call 911. ____________________________________________________________________________________________    ______________________________________________________________________________________________  Specialty Pain Scale  Introduction:  There are significant differences in how pain is reported. The word pain usually refers to physical pain, but it is also a common synonym of suffering. The medical community uses a scale from 0 (zero) to 10 (ten) to report pain level. Zero (0) is described as "no pain", while ten (10) is described as "the worse pain you can imagine". The problem with this scale is that physical pain is reported along with suffering.  Suffering refers to mental pain, or more often yet it refers to any unpleasant feeling, emotion or aversion associated with the perception of harm or threat of harm. It is the psychological component of pain.  Pain Specialists prefer to separate the two components. The pain scale used by this practice is the Verbal Numerical Rating Scale (VNRS-11). This scale is for the physical pain only. DO NOT INCLUDE how your pain psychologically affects you. This scale is for adults 21 years of age and older. It has 11 (eleven) levels. The 1st level is 0/10. This means: "right now, I have no pain". In the context of pain management, it also means: "right now, my physical pain is under control with the current therapy".  General Information:  The scale should reflect your current level of pain. Unless you are specifically asked for the level of your worst pain, or your average pain. If you are asked for one of these two, then it should be understood that it is over the past 24 hours.  Levels 1 (one) through 5 (five) are described below, and can be treated as an outpatient. Ambulatory pain management facilities such as ours are more than adequate to treat these levels. Levels 6 (six) through 10 (ten) are also described below, however, these must be treated as a hospitalized patient. While levels 6 (six) and 7 (seven) may be evaluated at an urgent care facility, levels 8 (eight) through 10 (ten)   constitute medical emergencies and as such, they belong in a hospital's emergency department. When having these levels (as described below), do not come to our office. Our facility is not equipped to manage these levels. Go directly to an urgent care facility or an emergency department to be evaluated.  Definitions:  Activities of Daily Living (ADL): Activities of daily living (ADL or ADLs) is a term used in healthcare to refer to people's daily self-care activities. Health professionals often use a person's ability or inability  to perform ADLs as a measurement of their functional status, particularly in regard to people post injury, with disabilities and the elderly. There are two ADL levels: Basic and Instrumental. Basic Activities of Daily Living (BADL  or BADLs) consist of self-care tasks that include: Bathing and showering; personal hygiene and grooming (including brushing/combing/styling hair); dressing; Toilet hygiene (getting to the toilet, cleaning oneself, and getting back up); eating and self-feeding (not including cooking or chewing and swallowing); functional mobility, often referred to as "transferring", as measured by the ability to walk, get in and out of bed, and get into and out of a chair; the broader definition (moving from one place to another while performing activities) is useful for people with different physical abilities who are still able to get around independently. Basic ADLs include the things many people do when they get up in the morning and get ready to go out of the house: get out of bed, go to the toilet, bathe, dress, groom, and eat. On the average, loss of function typically follows a particular order. Hygiene is the first to go, followed by loss of toilet use and locomotion. The last to go is the ability to eat. When there is only one remaining area in which the person is independent, there is a 62.9% chance that it is eating and only a 3.5% chance that it is hygiene. Instrumental Activities of Daily Living (IADL or IADLs) are not necessary for fundamental functioning, but they let an individual live independently in a community. IADL consist of tasks that include: cleaning and maintaining the house; home establishment and maintenance; care of others (including selecting and supervising caregivers); care of pets; child rearing; managing money; managing financials (investments, etc.); meal preparation and cleanup; shopping for groceries and necessities; moving within the community; safety procedures  and emergency responses; health management and maintenance (taking prescribed medications); and using the telephone or other form of communication.  Instructions:  Most patients tend to report their pain as a combination of two factors, their physical pain and their psychosocial pain. This last one is also known as "suffering" and it is reflection of how physical pain affects you socially and psychologically. From now on, report them separately.  From this point on, when asked to report your pain level, report only your physical pain. Use the following table for reference.  Pain Clinic Pain Levels (0-5/10)  Pain Level Score  Description  No Pain 0   Mild pain 1 Nagging, annoying, but does not interfere with basic activities of daily living (ADL). Patients are able to eat, bathe, get dressed, toileting (being able to get on and off the toilet and perform personal hygiene functions), transfer (move in and out of bed or a chair without assistance), and maintain continence (able to control bladder and bowel functions). Blood pressure and heart rate are unaffected. A normal heart rate for a healthy adult ranges from 60 to 100 bpm (beats per minute).   Mild to moderate pain 2 Noticeable   and distracting. Impossible to hide from other people. More frequent flare-ups. Still possible to adapt and function close to normal. It can be very annoying and may have occasional stronger flare-ups. With discipline, patients may get used to it and adapt.   Moderate pain 3 Interferes significantly with activities of daily living (ADL). It becomes difficult to feed, bathe, get dressed, get on and off the toilet or to perform personal hygiene functions. Difficult to get in and out of bed or a chair without assistance. Very distracting. With effort, it can be ignored when deeply involved in activities.   Moderately severe pain 4 Impossible to ignore for more than a few minutes. With effort, patients may still be able to  manage work or participate in some social activities. Very difficult to concentrate. Signs of autonomic nervous system discharge are evident: dilated pupils (mydriasis); mild sweating (diaphoresis); sleep interference. Heart rate becomes elevated (>115 bpm). Diastolic blood pressure (lower number) rises above 100 mmHg. Patients find relief in laying down and not moving.   Severe pain 5 Intense and extremely unpleasant. Associated with frowning face and frequent crying. Pain overwhelms the senses.  Ability to do any activity or maintain social relationships becomes significantly limited. Conversation becomes difficult. Pacing back and forth is common, as getting into a comfortable position is nearly impossible. Pain wakes you up from deep sleep. Physical signs will be obvious: pupillary dilation; increased sweating; goosebumps; brisk reflexes; cold, clammy hands and feet; nausea, vomiting or dry heaves; loss of appetite; significant sleep disturbance with inability to fall asleep or to remain asleep. When persistent, significant weight loss is observed due to the complete loss of appetite and sleep deprivation.  Blood pressure and heart rate becomes significantly elevated. Caution: If elevated blood pressure triggers a pounding headache associated with blurred vision, then the patient should immediately seek attention at an urgent or emergency care unit, as these may be signs of an impending stroke.    Emergency Department Pain Levels (6-10/10)  Emergency Room Pain 6 Severely limiting. Requires emergency care and should not be seen or managed at an outpatient pain management facility. Communication becomes difficult and requires great effort. Assistance to reach the emergency department may be required. Facial flushing and profuse sweating along with potentially dangerous increases in heart rate and blood pressure will be evident.   Distressing pain 7 Self-care is very difficult. Assistance is required to  transport, or use restroom. Assistance to reach the emergency department will be required. Tasks requiring coordination, such as bathing and getting dressed become very difficult.   Disabling pain 8 Self-care is no longer possible. At this level, pain is disabling. The individual is unable to do even the most "basic" activities such as walking, eating, bathing, dressing, transferring to a bed, or toileting. Fine motor skills are lost. It is difficult to think clearly.   Incapacitating pain 9 Pain becomes incapacitating. Thought processing is no longer possible. Difficult to remember your own name. Control of movement and coordination are lost.   The worst pain imaginable 10 At this level, most patients pass out from pain. When this level is reached, collapse of the autonomic nervous system occurs, leading to a sudden drop in blood pressure and heart rate. This in turn results in a temporary and dramatic drop in blood flow to the brain, leading to a loss of consciousness. Fainting is one of the body's self defense mechanisms. Passing out puts the brain in a calmed state and causes it to shut down   for a while, in order to begin the healing process.    Summary: 1.   Refer to this scale when providing us with your pain level. 2.   Be accurate and careful when reporting your pain level. This will help with your care. 3.   Over-reporting your pain level will lead to loss of credibility. 4.   Even a level of 1/10 means that there is pain and will be treated at our facility. 5.   High, inaccurate reporting will be documented as "Symptom Exaggeration", leading to loss of credibility and suspicions of possible secondary gains such as obtaining more narcotics, or wanting to appear disabled, for fraudulent reasons. 6.   Only pain levels of 5 or below will be seen at our facility. 7.   Pain levels of 6 and above will be sent to the Emergency Department and the appointment  cancelled.  ______________________________________________________________________________________________     

## 2019-02-20 ENCOUNTER — Telehealth: Payer: Self-pay

## 2019-02-20 NOTE — Telephone Encounter (Signed)
Post procedure phone call.  LM 

## 2019-02-23 ENCOUNTER — Telehealth: Payer: Self-pay | Admitting: Pain Medicine

## 2019-02-23 NOTE — Telephone Encounter (Signed)
Pt called and stated that she had a nerve block done Thursday and it worked for that day but she has been in severe pain ever since and doesn't know what to do?

## 2019-02-23 NOTE — Telephone Encounter (Signed)
Denies any fever, facial drooping, altered mental status. Patient advised this is expected to experience some worsening of pain for a few days following a procedure. Advised to apply heat periodically to injection site.

## 2019-02-25 ENCOUNTER — Emergency Department: Payer: BLUE CROSS/BLUE SHIELD

## 2019-02-25 ENCOUNTER — Other Ambulatory Visit: Payer: Self-pay

## 2019-02-25 ENCOUNTER — Encounter: Payer: Self-pay | Admitting: Emergency Medicine

## 2019-02-25 ENCOUNTER — Telehealth: Payer: Self-pay | Admitting: *Deleted

## 2019-02-25 ENCOUNTER — Emergency Department
Admission: EM | Admit: 2019-02-25 | Discharge: 2019-02-25 | Disposition: A | Discharge status: Home / Self Care | Payer: BLUE CROSS/BLUE SHIELD | Attending: Emergency Medicine | Admitting: Emergency Medicine

## 2019-02-25 DIAGNOSIS — I10 Essential (primary) hypertension: Secondary | ICD-10-CM | POA: Insufficient documentation

## 2019-02-25 DIAGNOSIS — E119 Type 2 diabetes mellitus without complications: Secondary | ICD-10-CM | POA: Insufficient documentation

## 2019-02-25 DIAGNOSIS — Z794 Long term (current) use of insulin: Secondary | ICD-10-CM | POA: Diagnosis not present

## 2019-02-25 DIAGNOSIS — R519 Headache, unspecified: Secondary | ICD-10-CM

## 2019-02-25 DIAGNOSIS — Z79899 Other long term (current) drug therapy: Secondary | ICD-10-CM | POA: Diagnosis not present

## 2019-02-25 DIAGNOSIS — Z951 Presence of aortocoronary bypass graft: Secondary | ICD-10-CM | POA: Diagnosis not present

## 2019-02-25 DIAGNOSIS — Z7902 Long term (current) use of antithrombotics/antiplatelets: Secondary | ICD-10-CM | POA: Insufficient documentation

## 2019-02-25 DIAGNOSIS — Z7982 Long term (current) use of aspirin: Secondary | ICD-10-CM | POA: Diagnosis not present

## 2019-02-25 DIAGNOSIS — R51 Headache: Secondary | ICD-10-CM | POA: Insufficient documentation

## 2019-02-25 DIAGNOSIS — F17210 Nicotine dependence, cigarettes, uncomplicated: Secondary | ICD-10-CM | POA: Insufficient documentation

## 2019-02-25 DIAGNOSIS — R000 Tachycardia, unspecified: Secondary | ICD-10-CM | POA: Diagnosis not present

## 2019-02-25 DIAGNOSIS — I2510 Atherosclerotic heart disease of native coronary artery without angina pectoris: Secondary | ICD-10-CM | POA: Diagnosis not present

## 2019-02-25 LAB — CBC
HCT: 47.1 % — ABNORMAL HIGH (ref 36.0–46.0)
Hemoglobin: 16.1 g/dL — ABNORMAL HIGH (ref 12.0–15.0)
MCH: 29.9 pg (ref 26.0–34.0)
MCHC: 34.2 g/dL (ref 30.0–36.0)
MCV: 87.5 fL (ref 80.0–100.0)
Platelets: 390 10*3/uL (ref 150–400)
RBC: 5.38 MIL/uL — ABNORMAL HIGH (ref 3.87–5.11)
RDW: 12.8 % (ref 11.5–15.5)
WBC: 10.2 10*3/uL (ref 4.0–10.5)
nRBC: 0 % (ref 0.0–0.2)

## 2019-02-25 LAB — BASIC METABOLIC PANEL
Anion gap: 12 (ref 5–15)
BUN: 10 mg/dL (ref 6–20)
CHLORIDE: 94 mmol/L — AB (ref 98–111)
CO2: 23 mmol/L (ref 22–32)
Calcium: 9.9 mg/dL (ref 8.9–10.3)
Creatinine, Ser: 0.62 mg/dL (ref 0.44–1.00)
GFR calc Af Amer: >60 mL/min (ref >60)
GFR calc non Af Amer: >60 mL/min (ref >60)
Glucose, Bld: 599 mg/dL (ref 70–99)
Potassium: 4.9 mmol/L (ref 3.5–5.1)
Sodium: 129 mmol/L — ABNORMAL LOW (ref 135–145)

## 2019-02-25 LAB — GLUCOSE, CAPILLARY
Glucose-Capillary: 357 mg/dL — ABNORMAL HIGH (ref 70–99)
Glucose-Capillary: 254 mg/dL — ABNORMAL HIGH (ref 70–99)

## 2019-02-25 LAB — TROPONIN I: Troponin I: <0.03 ng/mL (ref <0.03)

## 2019-02-25 MED ORDER — HYDRALAZINE HCL 20 MG/ML IJ SOLN
5.0000 mg | Freq: Once | INTRAMUSCULAR | Status: AC
  Administered 2019-02-25: 5 mg via INTRAVENOUS
  Filled 2019-02-25: qty 1

## 2019-02-25 MED ORDER — PROCHLORPERAZINE EDISYLATE 10 MG/2ML IJ SOLN
10.0000 mg | Freq: Once | INTRAMUSCULAR | Status: AC
  Administered 2019-02-25: 10 mg via INTRAVENOUS
  Filled 2019-02-25: qty 2

## 2019-02-25 MED ORDER — FENTANYL CITRATE (PF) 100 MCG/2ML IJ SOLN
50.0000 ug | Freq: Once | INTRAMUSCULAR | Status: AC
  Administered 2019-02-25: 50 ug via INTRAVENOUS

## 2019-02-25 MED ORDER — SODIUM CHLORIDE 0.9 % IV BOLUS
1000.0000 mL | Freq: Once | INTRAVENOUS | Status: AC
  Administered 2019-02-25: 1000 mL via INTRAVENOUS

## 2019-02-25 MED ORDER — FENTANYL CITRATE (PF) 100 MCG/2ML IJ SOLN
INTRAMUSCULAR | Status: AC
  Administered 2019-02-25: 50 ug via INTRAVENOUS
  Filled 2019-02-25: qty 2

## 2019-02-25 MED ORDER — KETOROLAC TROMETHAMINE 30 MG/ML IJ SOLN
30.0000 mg | Freq: Once | INTRAMUSCULAR | Status: AC
  Administered 2019-02-25: 30 mg via INTRAVENOUS
  Filled 2019-02-25: qty 1

## 2019-02-25 MED ORDER — PROCHLORPERAZINE MALEATE 10 MG PO TABS: 10.0000 mg | ORAL_TABLET | Freq: Three times a day (TID) | ORAL | 0 refills | Status: DC | PRN

## 2019-02-25 NOTE — Telephone Encounter (Signed)
Pt called again and stated that her blood pressure was 250/140 I told her that we couldn't help her with that and she needed to go to the ER.

## 2019-02-25 NOTE — Telephone Encounter (Signed)
I spoke with her last week because she was having worsened pain after procedure. Can she come for  Toradol/Norflex today?

## 2019-02-25 NOTE — Telephone Encounter (Signed)
Yes

## 2019-02-25 NOTE — ED Triage Notes (Signed)
Pt reports blood pressure has been running high and she has had a massive HA.

## 2019-02-25 NOTE — Discharge Instructions (Addendum)
Please seek medical attention for any high fevers, chest pain, shortness of breath, change in behavior, persistent vomiting, bloody stool or any other new or concerning symptoms.  

## 2019-02-25 NOTE — Telephone Encounter (Signed)
Please call patient to come for Toradol/Norflex today.

## 2019-02-25 NOTE — ED Notes (Signed)
Pt states pain was "better" for 15 minutes after the pain medicine but 10/10pain now. Pt updated on wait, BG taken, new orders obtained.

## 2019-02-25 NOTE — Telephone Encounter (Signed)
Crystal notified.

## 2019-02-25 NOTE — ED Provider Notes (Signed)
[facility] Emergency Department Provider Note  ____________________________________________   I have reviewed the triage vital signs and the nursing notes.   HISTORY  Chief Complaint High blood pressure  History limited by: Not Limited   HPI Erika [name] is a 48 y.o. female who presents to the emergency department today because of concerns for high blood pressure.  The patient does have a history of chronic headache.  She states she has had this headache for 3 years.  She is followed by pain management and had an injection done less than a week ago without any significant results.  She was trying to follow-up with her pain management doctor however when she stated her blood pressure was very high today they told her to go straight to the emergency department.  Patient states she has a history of malignant hypertension is on multiple medications.  She states she has been taking her medications.  Noticed that the blood pressure was in the 240s this morning.  She thinks part of this is due to the pain.  Patient denies any chest pain.  Denies any shortness of breath.   Per medical record review patient has a history of CAD, HTN.  Past Medical History:  Diagnosis Date  . Arthralgia of temporomandibular joint   . CAD, multiple vessel    a. cath 06/29/16: ostLM 40%, ostLAD 40%, pLAD 95%, ost-pLCx 60%, pLCx 95%, mLCx 60%, mRCA 95%, D2 50%, LVSF nl;  b. 07/2016 CABG x 4 (LIMA->LAD, VG->Diag, VG->OM, VG->RCA); c. 08/2016 Cath: 3VD w/ 4/4 patent grafts. LAD distal to LIMA has diff dzs->Med rx.  . Carotid arterial disease ([place])    a. 07/2016 s/p R CEA.  . Depression   . Diastolic dysfunction    a. echo 06/28/16: EF 50-55%, mild inf wall HK, GR1DD, mild MR, RV sys fxn nl, mildly dilated LA, PASP nl  . Fatty liver disease, nonalcoholic 7517  . HLD (hyperlipidemia)   . Labile hypertension    a. prior renal ngiogram negative for RAS in 03/2016; b. catecholamines and  metanephrines normal, mildly elevated renin with normal aldosterone and normal ratio in 02/2016  . Obesity   . PTSD (post-traumatic stress disorder)   . Tobacco abuse    a. 2018 - cut back from 2 ppd to 0.5 ppd.  . Type 2 diabetes mellitus [facility]) January 2017    Patient Active Problem List   Diagnosis Date Noted  . Atypical facial pain 01/27/2019  . Chronic ear pain (Right) 01/27/2019  . Chronic jaw pain (Right) 01/27/2019  . Geniculate Neuralgia (Right) 01/27/2019  . Vitamin D deficiency 01/19/2019  . Neurogenic pain 01/19/2019  . Chronic anticoagulation (PLAVIX) 01/19/2019  . Elevated C-reactive protein (CRP) 01/15/2019  . Elevated sed rate 01/15/2019  . Chronic pain syndrome 01/07/2019  . Long term current use of opiate analgesic 01/07/2019  . Long term prescription benzodiazepine use 01/07/2019  . Pharmacologic therapy 01/07/2019  . Disorder of skeletal system 01/07/2019  . Problems influencing health status 01/07/2019  . Chronic headaches (Primary Area of Pain) (Right) 01/07/2019  . Opiate use 09/24/2018  . Insect bite 09/08/2018  . Edema 07/01/2018  . Myalgia 06/24/2018  . Chronic pain of both ankles 12/27/2017  . Tendinopathy of right gluteus medius 12/27/2017  . Tendinopathy of left gluteus medius 12/27/2017  . Bilateral hip pain 12/26/2017  . Other chronic pain 10/17/2017  . Elevated troponin I level 05/21/2017  . Sepsis ([place]) 05/19/2017  . Hyperosmolar non-ketotic state in patient  with type 2 diabetes mellitus ([place]) 05/19/2017  . Major depressive disorder, recurrent episode, moderate ([place]) 11/19/2016  . Insomnia 10/30/2016  . Hypertension, accelerated with heart disease, without CHF   . Constipation 07/25/2016  . S/P CABG x 4 07/06/2016  . Bradycardia   . CAD (coronary artery disease)   . Carotid stenosis   . CAD in native artery 06/29/2016  . Elevated troponin 06/28/2016  . Essential hypertension, malignant 06/28/2016  . Tobacco abuse 06/28/2016  . Essential  hypertension   . Malignant hypertension   . Diabetes ([place])   . Chest pain with high risk for cardiac etiology 06/27/2016  . NSTEMI (non-ST elevated myocardial infarction) ([place]) 06/27/2016  . Proteinuria 03/15/2016  . Renal artery stenosis ([place]) 03/15/2016  . MDD (major depressive disorder) 10/17/2015  . Agoraphobia with panic attacks 04/25/2015  . HTN (hypertension), malignant 10/20/2013  . Cluster headache 03/20/2012  . HLD (hyperlipidemia) 07/26/2010  . ADJUSTMENT DISORDER WITH MIXED FEATURES 07/26/2010    Past Surgical History:  Procedure Laterality Date  . ABDOMINAL AORTOGRAM W/LOWER EXTREMITY [name] 10/15/2018   Procedure: ABDOMINAL AORTOGRAM W/LOWER EXTREMITY;  Surgeon: Wellington Hampshire, MD;  Location: [place] [facility];  Service: Cardiovascular;  Laterality: [name];  . CARDIAC CATHETERIZATION [name] 06/29/2016   Procedure: Left Heart Cath and Coronary Angiography;  Surgeon: Minna Merritts, MD;  Location: [place] [facility];  Service: Cardiovascular;  Laterality: [name];  . CARDIAC CATHETERIZATION [name] 08/29/2016   Procedure: Left Heart Cath and Cors/Grafts Angiography;  Surgeon: Wellington Hampshire, MD;  Location: [place] [facility];  Service: Cardiovascular;  Laterality: [name];  . CESAREAN SECTION    . CHOLECYSTECTOMY    . CORONARY ARTERY BYPASS GRAFT [name] 07/06/2016   Procedure: CORONARY ARTERY BYPASS GRAFTING (CABG) x four, using left internal mammary artery and right leg greater saphenous vein harvested endoscopically;  Surgeon: Ivin Poot, MD;  Location: [place];  Service: Open Heart Surgery;  Laterality: [name];  . ENDARTERECTOMY Right 07/06/2016   Procedure: ENDARTERECTOMY CAROTID;  Surgeon: Rosetta Posner, MD;  Location: [place];  Service: Vascular;  Laterality: Right;  . PERIPHERAL VASCULAR CATHETERIZATION [name] 04/18/2016   Procedure: Renal Angiography;  Surgeon: Wellington Hampshire, MD;  Location: [place] [facility];  Service: Cardiovascular;  Laterality: [name];  . PERIPHERAL VASCULAR INTERVENTION  Left 10/15/2018   Procedure: PERIPHERAL VASCULAR INTERVENTION;  Surgeon: Wellington Hampshire, MD;  Location: [place] [facility];  Service: Cardiovascular;  Laterality: Left;  Left superficial femoral  . TEE WITHOUT CARDIOVERSION [name] 07/06/2016   Procedure: TRANSESOPHAGEAL ECHOCARDIOGRAM (TEE);  Surgeon: Ivin Poot, MD;  Location: [place];  Service: Open Heart Surgery;  Laterality: [name];  . TONSILLECTOMY      Prior to Admission medications   Medication Sig Start Date End Date Taking? Authorizing Provider  albuterol (PROVENTIL HFA;VENTOLIN HFA) 108 (90 Base) MCG/ACT inhaler Inhale 2 puffs into the lungs every 6 (six) hours as needed for wheezing. 02/12/18 02/12/19  Lucille Passy, MD  Alirocumab (PRALUENT) 150 MG/ML SOAJ Inject 150 mg into the skin every 14 (fourteen) days. 01/21/19   Wellington Hampshire, MD  aspirin EC 81 MG tablet Take 1 tablet (81 mg total) by mouth daily. 07/17/17   Theora Gianotti, NP  benztropine (COGENTIN) 0.5 MG tablet Take 1 tablet (0.5 mg total) by mouth daily. 02/18/19 02/18/20  Arfeen, Arlyce Harman, MD  calcium carbonate (CALCIUM 600) 600 MG TABS tablet Take 1 tablet (600 mg total) by mouth 2 (two) times daily  with a meal for 30 days. 01/19/19 02/18/19  Milinda Pointer, MD  carvedilol (COREG) 25 MG tablet TAKE 1 TABLET (25 MG TOTAL) BY MOUTH 2 (TWO) TIMES DAILY WITH A MEAL. 12/31/18   Wellington Hampshire, MD  chlorproMAZINE (THORAZINE) 50 MG tablet Take 1 tablet (50 mg total) by mouth at bedtime. 02/18/19   Arfeen, Arlyce Harman, MD  Cholecalciferol (VITAMIN D3) 125 MCG (5000 UT) CAPS Take 1 capsule (5,000 Units total) by mouth daily with breakfast. Take along with calcium and magnesium. 01/19/19 07/18/19  Milinda Pointer, MD  clonazePAM (KLONOPIN) 0.5 MG tablet Take one tab three times a day and 4th as needed 02/18/19   Arfeen, Arlyce Harman, MD  clopidogrel (PLAVIX) 75 MG tablet Take 1 tablet (75 mg total) by mouth daily. 10/15/18 10/15/19  Wellington Hampshire, MD  cyclobenzaprine (FEXMID) 7.5  MG tablet TAKE 1 TABLET BY MOUTH 3 TIMES A DAY AS NEEDED FOR MUSCLE SPASMS 02/05/19   Lucille Passy, MD  ergocalciferol (VITAMIN D2) 1.25 MG (50000 UT) capsule Take 1 capsule (50,000 Units total) by mouth 2 (two) times a week. X 6 weeks. 01/19/19 03/02/19  Milinda Pointer, MD  FLUoxetine HCl 60 MG TABS Take 60 mg by mouth daily. 02/18/19   Arfeen, Arlyce Harman, MD  gabapentin (NEURONTIN) 100 MG capsule Take 1-3 capsules (100-300 mg total) by mouth 4 (four) times daily for 30 days. Follow written titration schedule. 02/11/19 03/13/19  Milinda Pointer, MD  Insulin Glargine (LANTUS SOLOSTAR) 100 UNIT/ML Solostar Pen Inject 40 Units into the skin daily. 12/25/18   Lucille Passy, MD  insulin NPH-regular Human (NOVOLIN 70/30 RELION) (70-30) 100 UNIT/ML injection Inject 5u SQ bid; but follow flow chart and may increase to 25u depending on flow chart up to tid 11/11/18   Lucille Passy, MD  Insulin Syringe-Needle U-100 29G X 1/2" 0.3 ML MISC 1 each by Does not apply route 2 (two) times daily. 05/20/17   Nita Sells, MD  isosorbide mononitrate (IMDUR) 30 MG 24 hr tablet TAKE 1 TABLET (30 MG TOTAL) BY MOUTH DAILY. PLEASE KEEP APPOINTMENT FOR FURTHER REFILLS! THANKS :) 10/17/18   Wellington Hampshire, MD  lamoTRIgine (LAMICTAL) 200 MG tablet Take 1 tablet (200 mg total) by mouth daily. 02/18/19   Arfeen, Arlyce Harman, MD  losartan (COZAAR) 100 MG tablet Take 1 tablet (100 mg total) by mouth daily. 11/07/18   Wellington Hampshire, MD  magic mouthwash SOLN Take 15 mLs by mouth 3 (three) times daily as needed for mouth pain. 11/10/18   Lucille Passy, MD  Magnesium 500 MG CAPS Take 1 capsule (500 mg total) by mouth 2 (two) times daily at 8 am and 10 pm. 01/19/19 07/18/19  Milinda Pointer, MD  naloxone [facility]) nasal spray 4 mg/0.1 mL 4 mg (contents of 1 nasal spray) as a single dose in one nostril; may repeat every 2 to 3 minutes in alternating nostrils until medical assistance becomes available 12/25/18   Lucille Passy, MD   nitroGLYCERIN (NITROSTAT) 0.4 MG SL tablet PLACE 1 TABLET (0.4 MG TOTAL) UNDER THE TONGUE EVERY 5 (FIVE) MINUTES AS NEEDED FOR CHEST PAIN. 12/30/18   Lucille Passy, MD  ondansetron (ZOFRAN) 4 MG tablet TAKE 1 TABLET (4 MG TOTAL) BY MOUTH DAILY AS NEEDED. 11/24/18   Lucille Passy, MD  rosuvastatin (CRESTOR) 10 MG tablet Take 1 tablet (10 mg total) by mouth daily. 02/26/18   Lucille Passy, MD  spironolactone (ALDACTONE) 25 MG tablet Take 1  tablet (25 mg total) by mouth daily. 09/18/18   Wellington Hampshire, MD    Allergies Patient has no known allergies.  Family History  Adopted: Yes  Problem Relation Age of Onset  . Diabetes Mother   . Diabetes Father   . Alcohol abuse Father   . Heart disease Father   . Drug abuse Father   . Stroke Sister   . Anxiety disorder Sister     Social History Social History   Tobacco Use  . Smoking status: Current Every Day Smoker    Packs/day: 1.00    Types: Cigarettes    Last attempt to quit: 05/23/2017    Years since quitting: 1.7  . Smokeless tobacco: Never Used  . Tobacco comment: patient smokes when she drives.   Substance Use Topics  . Alcohol use: Yes    Alcohol/week: 0.0 standard drinks    Comment: socially  . Drug use: No    Review of Systems Constitutional: No fever/chills Eyes: Positive for right eye vision change which she gets with her headaches ENT: No sore throat. Cardiovascular: Denies chest pain. Respiratory: Denies shortness of breath. Gastrointestinal: No abdominal pain.  No nausea, no vomiting.  No diarrhea.   Genitourinary: Negative for dysuria. Musculoskeletal: Negative for back pain. Skin: Negative for rash. Neurological: Positive for headache.  ____________________________________________   PHYSICAL EXAM:  VITAL SIGNS: ED Triage Vitals  Enc Vitals Group     BP 02/25/19 1041 (!) 249/114     Pulse Rate 02/25/19 1041 (!) 104     Resp 02/25/19 1041 20     Temp 02/25/19 1041 98.2 F (36.8 C)     Temp Source  02/25/19 1041 Oral     SpO2 02/25/19 1041 96 %     Weight 02/25/19 1042 201 lb (91.2 kg)     Height 02/25/19 1042 5\' 7"  (1.702 m)     Head Circumference --      Peak Flow --      Pain Score 02/25/19 1042 10    Constitutional: Alert and oriented.  Eyes: Conjunctivae are normal.  ENT      Head: Normocephalic and atraumatic.      Nose: No congestion/rhinnorhea.      Mouth/Throat: Mucous membranes are moist.      Neck: No stridor. Hematological/Lymphatic/Immunilogical: No cervical lymphadenopathy. Cardiovascular: Normal rate, regular rhythm.  No murmurs, rubs, or gallops.  Respiratory: Normal respiratory effort without tachypnea nor retractions. Breath sounds are clear and equal bilaterally. No wheezes/rales/rhonchi. Gastrointestinal: Soft and non tender. No rebound. No guarding.  Genitourinary: Deferred Musculoskeletal: Normal range of motion in all extremities. No lower extremity edema. Neurologic:  Normal speech and language. No gross focal neurologic deficits are appreciated.  Skin:  Skin is warm, dry and intact. No rash noted. Psychiatric: Mood and affect are normal. Speech and behavior are normal. Patient exhibits appropriate insight and judgment.  ____________________________________________    LABS (pertinent positives/negatives)  Trop <0.03 CBC wbc 10.2, hgb 16.1, plt 390 BMP na 129, k 4.9, glu 599, anion gap 12  ____________________________________________   EKG  I, Nance Pear, attending physician, personally viewed and interpreted this EKG  EKG Time: 1045 Rate: 104 Rhythm: sinus tachycardia Axis: normal Intervals: qtc 449 QRS: q waves v1, v2, LVH ST changes: no st elevation Impression: abnormal ekg   ____________________________________________    RADIOLOGY  CT head No acute abnormality  ____________________________________________   PROCEDURES  Procedures  ____________________________________________   INITIAL IMPRESSION / ASSESSMENT  AND PLAN / ED  COURSE  Pertinent labs & imaging results that were available during my care of the patient were reviewed by me and considered in my medical decision making (see chart for details).   Patient presented to the emergency department today with complaints of high blood pressure as well as severe headache.  Terms of the blood pressure patient states she has a history retention.  She states that it does go up and go down often.  Head CT negative. Patient did feel better after medications. Felt comfortable going home. The patient stated that her blood pressure at discharge was her baseline and she felt comfortable going home. Discussed importance of continued follow up. ____________________________________________   FINAL CLINICAL IMPRESSION(S) / ED DIAGNOSES  Final diagnoses:  Hypertension, unspecified type  Bad headache     Note: This dictation was prepared with Dragon dictation. Any transcriptional errors that result from this process are unintentional     Nance Pear, MD 02/25/19 1735

## 2019-02-27 DIAGNOSIS — M9903 Segmental and somatic dysfunction of lumbar region: Secondary | ICD-10-CM | POA: Diagnosis not present

## 2019-02-27 DIAGNOSIS — M545 Low back pain: Secondary | ICD-10-CM | POA: Diagnosis not present

## 2019-02-27 DIAGNOSIS — G44019 Episodic cluster headache, not intractable: Secondary | ICD-10-CM | POA: Diagnosis not present

## 2019-02-27 DIAGNOSIS — M9901 Segmental and somatic dysfunction of cervical region: Secondary | ICD-10-CM | POA: Diagnosis not present

## 2019-03-01 NOTE — Progress Notes (Signed)
Patient's Name: Erika [name]  MRN: [id number]  Referring Provider: Lucille Passy, MD  DOB: [birth year]  PCP: Lucille Passy, MD  DOS: 03/04/2019  Note by: Gaspar Cola, MD  Service setting: Ambulatory outpatient  Specialty: Interventional Pain Management  Location: [facility] (AMB) Pain Management Facility    Patient type: Established   Primary Reason(s) for Visit: Encounter for post-procedure evaluation of chronic illness with mild to moderate exacerbation CC: Headache  HPI  Erika [name] is a 48 y.o. year old, female patient, who comes today for a post-procedure evaluation. She has HLD (hyperlipidemia); ADJUSTMENT DISORDER WITH MIXED FEATURES; Cluster headache; HTN (hypertension), malignant; Agoraphobia with panic attacks; MDD (major depressive disorder); Proteinuria; Renal artery stenosis ([place]); Chest pain with high risk for cardiac etiology; NSTEMI (non-ST elevated myocardial infarction) ([place]); Elevated troponin; Essential hypertension, malignant; Tobacco abuse; Essential hypertension; Malignant hypertension; Diabetes ([place]); CAD in native artery; Bradycardia; CAD (coronary artery disease); Carotid stenosis; S/P CABG x 4; Constipation; Hypertension, accelerated with heart disease, without CHF; Insomnia; Major depressive disorder, recurrent episode, moderate ([place]); Elevated troponin I level; Other chronic pain; Bilateral hip pain; Chronic ankle pain (Bilateral); Tendinopathy of right gluteus medius; Tendinopathy of left gluteus medius; Myalgia; Edema; Insect bite; Opiate use; Chronic pain syndrome; Long term current use of opiate analgesic; Long term prescription benzodiazepine use; Pharmacologic therapy; Disorder of skeletal system; Problems influencing health status; Chronic headaches (Primary Area of Pain) (Right); Vitamin D deficiency; Neurogenic pain; Chronic anticoagulation (PLAVIX); Atypical facial pain (Right); Chronic ear pain (Right); Chronic jaw pain (Right); Geniculate Neuralgia (Right);  History of carotid endarterectomy (Right); and Pain medication agreement signed on their problem list. Her primarily concern today is the Headache  Pain Assessment: Location: Right Head Radiating: pain radiaties around ear to forehead through my eye balls Onset: More than a month ago Duration: Chronic pain Quality: Sharp, Stabbing, Penetrating, Constant Severity: 10-Worst pain ever/10 (subjective, self-reported pain score)  Note: Reported level is inconsistent with clinical observations. Clinically the patient looks like a 2/10 A 2/10 is viewed as "Mild to Moderate" and described as noticeable and distracting. Impossible to hide from other people. More frequent flare-ups. Still possible to adapt and function close to normal. It can be very annoying and may have occasional stronger flare-ups. With discipline, patients may get used to it and adapt. Information on the proper use of the pain scale provided to the patient today. When using our objective Pain Scale, levels between 6 and 10/10 are said to belong in an emergency room, as it progressively worsens from a 6/10, described as severely limiting, requiring emergency care not usually available at an outpatient pain management facility. At a 6/10 level, communication becomes difficult and requires great effort. Assistance to reach the emergency department may be required. Facial flushing and profuse sweating along with potentially dangerous increases in heart rate and blood pressure will be evident. Effect on ADL: limits my daily activities Timing: Constant Modifying factors: medications BP: (!) 190/110  HR: (!) 101  Erika [name] comes in today for post-procedure evaluation.  Today we have reviewed the patient's condition and we will likely start her on oxycodone IR 5 mg p.o. twice daily.  The patient was informed to keep 8 hours between day Klonopin and the opioid analgesic.  Today I have provided the patient with written information regarding our  medication policies and rules in addition to some information regarding CBD.  The patient was instructed to stop taking the hydrocodone and to bring it back so that we  can count the remaining pills and dispose of them in front of witnesses.  She was informed that she needs to bring her medications and even the empty bottles for every appointment except for procedure days.  Today she will be signing her medication agreement with us.  Further details on both, my assessment(s), as well as the proposed treatment plan, please see below.  Post-Procedure Assessment  02/23/2019 Procedure: Diagnostic right-sided facial/Geniculate nerve block #2under fluoroscopic guidance and IV sedation Pre-procedure pain score:  10/10 Post-procedure pain score: 1/10 (100% relief) Influential Factors: BMI: 32.89 kg/m Intra-procedural challenges: None observed.         Assessment challenges: None detected.              Reported side-effects: None.        Post-procedural adverse reactions or complications: None reported         Sedation: Sedation provided. When no sedatives are used, the analgesic levels obtained are directly associated to the effectiveness of the local anesthetics. However, when sedation is provided, the level of analgesia obtained during the initial 1 hour following the intervention, is believed to be the result of a combination of factors. These factors may include, but are not limited to: 1. The effectiveness of the local anesthetics used. 2. The effects of the analgesic(s) and/or anxiolytic(s) used. 3. The degree of discomfort experienced by the patient at the time of the procedure. 4. The patients ability and reliability in recalling and recording the events. 5. The presence and influence of possible secondary gains and/or psychosocial factors. Reported result: Relief experienced during the 1st hour after the procedure: 100 % (Ultra-Short Term Relief)            Interpretative annotation: Clinically  appropriate result. Analgesia during this period is likely to be Local Anesthetic and/or IV Sedative (Analgesic/Anxiolytic) related.          Effects of local anesthetic: The analgesic effects attained during this period are directly associated to the localized infiltration of local anesthetics and therefore cary significant diagnostic value as to the etiological location, or anatomical origin, of the pain. Expected duration of relief is directly dependent on the pharmacodynamics of the local anesthetic used. Long-acting (4-6 hours) anesthetics used.  Reported result: Relief during the next 4 to 6 hour after the procedure: 100 % (Short-Term Relief)            Interpretative annotation: Clinically appropriate result. Analgesia during this period is likely to be Local Anesthetic-related.          Long-term benefit: Defined as the period of time past the expected duration of local anesthetics (1 hour for short-acting and 4-6 hours for long-acting). With the possible exception of prolonged sympathetic blockade from the local anesthetics, benefits during this period are typically attributed to, or associated with, other factors such as analgesic sensory neuropraxia, antiinflammatory effects, or beneficial biochemical changes provided by agents other than the local anesthetics.  Reported result: Extended relief following procedure: 100 %(last for only 1 day, pain came back worse then before) (Long-Term Relief)            Interpretative annotation: Clinically possible results. Good relief. No permanent benefit expected. Inflammation plays a part in the etiology to the pain.          Current benefits: Defined as reported results that persistent at this point in time.   Analgesia: 0 %            Function: Back to baseline ROM: Back   to baseline Interpretative annotation: Recurrence of symptoms. Therapeutic failure. Results would suggest persistent aggravating factors. Based on these results, it is likely that  this is the area where the pain is coming from however, there is no inflammation that can be addressed with repetitive steroid injections.  It is likely that what were dealing with is an entrapment neuropathy of the genicular nerve as a consequence of her prior carotid endarterectomy surgery and the scar tissue that this surgery has formed in the area.  Today I have offered the patient the possibility of a radiofrequency ablation, but she is not very enthusiastic about this.  Another alternative would be to consider exploratory surgery for the release of the entrapment neuropathy.  However, the problem with that type of surgery is that it is very likely that the scar tissue will recur and the condition will come back.  She is also not crazy about that alternative.  Interpretation: Results would suggest a successful diagnostic intervention.                  Plan:  Please see "Plan of Care" for details.                Controlled Substance Pharmacotherapy Assessment REMS (Risk Evaluation and Mitigation Strategy)  Analgesic: Hydrocodone/APAP 7.5/325 p.o. twice daily.  This will be stopped today and the patient will be started on oxycodone IR 5 mg p.o. twice daily.  (10 mg/day of oxycodone) (15 MME/day) MME/day: 15 mg/day.  No notes on file Pharmacokinetics: Liberation and absorption (onset of action): WNL Distribution (time to peak effect): WNL Metabolism and excretion (duration of action): WNL         Pharmacodynamics: Desired effects: Analgesia: Erika [name] reports >50% benefit. Functional ability: Patient reports that medication allows her to accomplish basic ADLs Clinically meaningful improvement in function (CMIF): Sustained CMIF goals met Perceived effectiveness: Described as relatively effective, allowing for increase in activities of daily living (ADL) Undesirable effects: Side-effects or Adverse reactions: None reported Monitoring: [place] PMP: Online review of the past 12-month period  conducted. Compliant with practice rules and regulations Last UDS on record: Summary  Date Value Ref Range Status  01/07/2019 FINAL  Final    Comment:    ==================================================================== [facility] COMP DRUG ANALYSIS,UR ==================================================================== Test                             Result       Flag       Units Drug Present and Declared for Prescription Verification   7-aminoclonazepam              115          EXPECTED   ng/mg creat    7-aminoclonazepam is an expected metabolite of clonazepam. Source    of clonazepam is a scheduled prescription medication.   Hydrocodone                    732          EXPECTED   ng/mg creat   Hydromorphone                  158          EXPECTED   ng/mg creat   Dihydrocodeine                 212          EXPECTED   ng/mg creat     Norhydrocodone                 1339         EXPECTED   ng/mg creat    Sources of hydrocodone include scheduled prescription    medications. Hydromorphone, dihydrocodeine and norhydrocodone are    expected metabolites of hydrocodone. Hydromorphone and    dihydrocodeine are also available as scheduled prescription    medications.   Cyclobenzaprine                PRESENT      EXPECTED   Desmethylcyclobenzaprine       PRESENT      EXPECTED    Desmethylcyclobenzaprine is an expected metabolite of    cyclobenzaprine.   Chlorpromazine                 PRESENT      EXPECTED   Acetaminophen                  PRESENT      EXPECTED Drug Absent but Declared for Prescription Verification   Lamotrigine                    Not Detected UNEXPECTED   Fluoxetine                     Not Detected UNEXPECTED   Salicylate                     Not Detected UNEXPECTED    Aspirin, as indicated in the declared medication list, is not    always detected even when used as directed.   Benztropine                    Not Detected  UNEXPECTED ==================================================================== Test                      Result    Flag   Units      Ref Range   Creatinine              165              mg/dL      >=20 ==================================================================== Declared Medications:  The flagging and interpretation on this report are based on the  following declared medications.  Unexpected results may arise from  inaccuracies in the declared medications.  **Note: The testing scope of this panel includes these medications:  Benztropine (Cogentin)  Chlorpromazine (Thorazine)  Clonazepam (Klonopin)  Cyclobenzaprine (Fexmid)  Fluoxetine  Hydrocodone (Norco)  Lamotrigine (Lamictal)  **Note: The testing scope of this panel does not include small to  moderate amounts of these reported medications:  Acetaminophen (Norco)  Aspirin (Aspirin 81)  **Note: The testing scope of this panel does not include following  reported medications:  Albuterol  Alirocumab  Carvedilol (Coreg)  Clopidogrel (Plavix)  Insulin  Insulin (Lantus)  Isosorbide (Imdur)  Losartan (Cozaar)  Mouthwash (Magic Mouthwash)  Naloxone (Narcan)  Nitroglycerin (Nitrostat)  Ondansetron (Zofran)  Rosuvastatin (Crestor)  Spironolactone (Aldactone)  Triamcinolone (Kenalog) ==================================================================== For clinical consultation, please call [phone]. ====================================================================    UDS interpretation: Compliant          Medication Assessment Form: Reviewed. Patient indicates being compliant with therapy Treatment compliance: Compliant Risk Assessment Profile: Aberrant behavior: See initial evaluations. None observed or detected today Comorbid factors increasing risk of overdose: See initial evaluation. No additional risks detected today Opioid risk   tool (ORT):  Opioid Risk  03/04/2019  Alcohol 3  Illegal Drugs 0  Rx  Drugs 0  Alcohol 0  Illegal Drugs 0  Rx Drugs 0  Age between 16-45 years  1  History of Preadolescent Sexual Abuse 0  Psychological Disease 0  Depression 1  Opioid Risk Tool Scoring 5  Opioid Risk Interpretation Moderate Risk    ORT Scoring interpretation table:  Score <3 = Low Risk for SUD  Score between 4-7 = Moderate Risk for SUD  Score >8 = High Risk for Opioid Abuse   Risk of substance use disorder (SUD): Low  Risk Mitigation Strategies:  Patient Counseling: Covered Patient-Prescriber Agreement (PPA): Present and active  Notification to other healthcare providers: Done  Pharmacologic Plan: Today we will take over the chronic pain medication management and from this point on our medication agreement with this patient is active.              Laboratory Chemistry  Inflammation Markers (CRP: Acute Phase) (ESR: Chronic Phase) Lab Results  Component Value Date   CRP 15 (H) 01/07/2019   ESRSEDRATE 73 (H) 01/07/2019   LATICACIDVEN 2.3 (HH) 05/19/2017                         Renal Markers Lab Results  Component Value Date   BUN 10 02/25/2019   CREATININE 0.62 02/25/2019   BCR 18 01/07/2019   GFRAA >60 02/25/2019   GFRNONAA >60 02/25/2019                             Hepatic Markers Lab Results  Component Value Date   AST 17 01/07/2019   ALT 46 (H) 01/07/2019   ALBUMIN 4.6 01/07/2019                        Note: Lab results reviewed.  Recent Imaging Results   Results for orders placed in visit on 02/19/19  DG C-Arm 1-60 Min-No Report   Narrative Fluoroscopy was utilized by the requesting physician.  No radiographic  interpretation.    Interpretation Report: Fluoroscopy was used during the procedure to assist with needle guidance. The images were interpreted intraoperatively by the requesting physician.        Meds   Current Outpatient Medications:  .  albuterol (PROVENTIL HFA;VENTOLIN HFA) 108 (90 Base) MCG/ACT inhaler, Inhale 2 puffs into the lungs every  6 (six) hours as needed for wheezing., Disp: 1 Inhaler, Rfl: 0 .  Alirocumab (PRALUENT) 150 MG/ML SOAJ, Inject 150 mg into the skin every 14 (fourteen) days., Disp: 2 pen, Rfl: 11 .  aspirin EC 81 MG tablet, Take 1 tablet (81 mg total) by mouth daily., Disp: 90 tablet, Rfl: 3 .  benztropine (COGENTIN) 0.5 MG tablet, Take 1 tablet (0.5 mg total) by mouth daily., Disp: 90 tablet, Rfl: 0 .  carvedilol (COREG) 25 MG tablet, TAKE 1 TABLET (25 MG TOTAL) BY MOUTH 2 (TWO) TIMES DAILY WITH A MEAL., Disp: 60 tablet, Rfl: 1 .  chlorproMAZINE (THORAZINE) 50 MG tablet, Take 1 tablet (50 mg total) by mouth at bedtime., Disp: 90 tablet, Rfl: 0 .  Cholecalciferol (VITAMIN D3) 125 MCG (5000 UT) CAPS, Take 1 capsule (5,000 Units total) by mouth daily with breakfast. Take along with calcium and magnesium., Disp: 30 capsule, Rfl: 5 .  clonazePAM (KLONOPIN) 0.5 MG tablet, Take one tab three times a day  and 4th as needed, Disp: 100 tablet, Rfl: 2 .  clopidogrel (PLAVIX) 75 MG tablet, Take 1 tablet (75 mg total) by mouth daily., Disp: 30 tablet, Rfl: 11 .  cyclobenzaprine (FEXMID) 7.5 MG tablet, TAKE 1 TABLET BY MOUTH 3 TIMES A DAY AS NEEDED FOR MUSCLE SPASMS, Disp: 60 tablet, Rfl: 5 .  FLUoxetine HCl 60 MG TABS, Take 60 mg by mouth daily., Disp: 90 tablet, Rfl: 0 .  gabapentin (NEURONTIN) 100 MG capsule, Take 1-3 capsules (100-300 mg total) by mouth 4 (four) times daily for 30 days. Follow written titration schedule., Disp: 360 capsule, Rfl: 0 .  Insulin Glargine (LANTUS SOLOSTAR) 100 UNIT/ML Solostar Pen, Inject 40 Units into the skin daily., Disp: 5 pen, Rfl: PRN .  insulin NPH-regular Human (NOVOLIN 70/30 RELION) (70-30) 100 UNIT/ML injection, Inject 5u SQ bid; but follow flow chart and may increase to 25u depending on flow chart up to tid, Disp: 10 mL, Rfl: 2 .  Insulin Syringe-Needle U-100 29G X 1/2" 0.3 ML MISC, 1 each by Does not apply route 2 (two) times daily., Disp: 30 each, Rfl: 0 .  isosorbide mononitrate  (IMDUR) 30 MG 24 hr tablet, TAKE 1 TABLET (30 MG TOTAL) BY MOUTH DAILY. PLEASE KEEP APPOINTMENT FOR FURTHER REFILLS! THANKS :), Disp: 90 tablet, Rfl: 3 .  lamoTRIgine (LAMICTAL) 200 MG tablet, Take 1 tablet (200 mg total) by mouth daily., Disp: 90 tablet, Rfl: 0 .  losartan (COZAAR) 100 MG tablet, Take 1 tablet (100 mg total) by mouth daily., Disp: 90 tablet, Rfl: 1 .  magic mouthwash SOLN, Take 15 mLs by mouth 3 (three) times daily as needed for mouth pain., Disp: 250 mL, Rfl: 1 .  Magnesium 500 MG CAPS, Take 1 capsule (500 mg total) by mouth 2 (two) times daily at 8 am and 10 pm., Disp: 60 capsule, Rfl: 5 .  naloxone (NARCAN) nasal spray 4 mg/0.1 mL, 4 mg (contents of 1 nasal spray) as a single dose in one nostril; may repeat every 2 to 3 minutes in alternating nostrils until medical assistance becomes available, Disp: 1 kit, Rfl: 0 .  nitroGLYCERIN (NITROSTAT) 0.4 MG SL tablet, PLACE 1 TABLET (0.4 MG TOTAL) UNDER THE TONGUE EVERY 5 (FIVE) MINUTES AS NEEDED FOR CHEST PAIN., Disp: 25 tablet, Rfl: PRN .  ondansetron (ZOFRAN) 4 MG tablet, TAKE 1 TABLET (4 MG TOTAL) BY MOUTH DAILY AS NEEDED., Disp: 10 tablet, Rfl: 2 .  prochlorperazine (COMPAZINE) 10 MG tablet, Take 1 tablet (10 mg total) by mouth every 8 (eight) hours as needed (headache)., Disp: 30 tablet, Rfl: 0 .  rosuvastatin (CRESTOR) 10 MG tablet, Take 1 tablet (10 mg total) by mouth daily., Disp: 90 tablet, Rfl: 3 .  spironolactone (ALDACTONE) 25 MG tablet, Take 1 tablet (25 mg total) by mouth daily., Disp: 30 tablet, Rfl: 5 .  calcium carbonate (CALCIUM 600) 600 MG TABS tablet, Take 1 tablet (600 mg total) by mouth 2 (two) times daily with a meal for 30 days., Disp: 60 tablet, Rfl: 0 .  Cyanocobalamin (VITAMIN B-12) 5000 MCG SUBL, Place 1 tablet (5,000 mcg total) under the tongue daily for 30 days., Disp: 30 each, Rfl: 0 .  oxyCODONE (OXY IR/ROXICODONE) 5 MG immediate release tablet, Take 1 tablet (5 mg total) by mouth 2 (two) times daily as  needed for up to 30 days for severe pain. Must last 30 days., Disp: 60 tablet, Rfl: 0  ROS  Constitutional: Denies any fever or chills Gastrointestinal: No reported hemesis, hematochezia,   vomiting, or acute GI distress Musculoskeletal: Denies any acute onset joint swelling, redness, loss of ROM, or weakness Neurological: No reported episodes of acute onset apraxia, aphasia, dysarthria, agnosia, amnesia, paralysis, loss of coordination, or loss of consciousness  Allergies  Erika [name] has No Known Allergies.  PFSH  Drug: Erika [name]  reports no history of drug use. Alcohol:  reports current alcohol use. Tobacco:  reports that she has been smoking cigarettes. She has been smoking about 1.00 pack per day. She has never used smokeless tobacco. Medical:  has a past medical history of Arthralgia of temporomandibular joint, CAD, multiple vessel, Carotid arterial disease (HCC), Depression, Diastolic dysfunction, Fatty liver disease, nonalcoholic (2016), HLD (hyperlipidemia), Labile hypertension, Obesity, PTSD (post-traumatic stress disorder), Tobacco abuse, and Type 2 diabetes mellitus (HCC) (January 2017). Surgical: Erika [name]  has a past surgical history that includes Cholecystectomy; Tonsillectomy; Cesarean section; Cardiac catheterization ([name], 04/18/2016); Cardiac catheterization ([name], 06/29/2016); Coronary artery bypass graft ([name], 07/06/2016); TEE without cardioversion ([name], 07/06/2016); Endarterectomy (Right, 07/06/2016); Cardiac catheterization ([name], 08/29/2016); ABDOMINAL AORTOGRAM W/LOWER EXTREMITY ([name], 10/15/2018); and PERIPHERAL VASCULAR INTERVENTION (Left, 10/15/2018). Family: family history includes Alcohol abuse in her father; Anxiety disorder in her sister; Diabetes in her father and mother; Drug abuse in her father; Heart disease in her father; Stroke in her sister. She was adopted.  Constitutional Exam  General appearance: Well nourished, well developed, and well hydrated. In no  apparent acute distress Vitals:   03/04/19 0859  BP: (!) 190/110  Pulse: (!) 101  Temp: 98.2 F (36.8 C)  SpO2: 94%  Weight: 210 lb (95.3 kg)  Height: 5' 7" (1.702 m)   BMI Assessment: Estimated body mass index is 32.89 kg/m as calculated from the following:   Height as of this encounter: 5' 7" (1.702 m).   Weight as of this encounter: 210 lb (95.3 kg).  BMI interpretation table: BMI level Category Range association with higher incidence of chronic pain  <18 kg/m2 Underweight   18.5-24.9 kg/m2 Ideal body weight   25-29.9 kg/m2 Overweight Increased incidence by 20%  30-34.9 kg/m2 Obese (Class I) Increased incidence by 68%  35-39.9 kg/m2 Severe obesity (Class II) Increased incidence by 136%  >40 kg/m2 Extreme obesity (Class III) Increased incidence by 254%   Patient's current BMI Ideal Body weight  Body mass index is 32.89 kg/m. Ideal body weight: 61.6 kg (135 lb 12.9 oz) Adjusted ideal body weight: 75.1 kg (165 lb 7.7 oz)   BMI Readings from Last 4 Encounters:  03/04/19 32.89 kg/m  03/02/19 32.98 kg/m  02/25/19 31.48 kg/m  02/19/19 31.48 kg/m   Wt Readings from Last 4 Encounters:  03/04/19 210 lb (95.3 kg)  03/02/19 210 lb 9.6 oz (95.5 kg)  02/25/19 201 lb (91.2 kg)  02/19/19 201 lb (91.2 kg)  Psych/Mental status: Alert, oriented x 3 (person, place, & time)       Eyes: PERLA Respiratory: No evidence of acute respiratory distress  Cervical Spine Area Exam  Skin & Axial Inspection: No masses, redness, edema, swelling, or associated skin lesions Alignment: Symmetrical Functional ROM: Unrestricted ROM      Stability: No instability detected Muscle Tone/Strength: Functionally intact. No obvious neuro-muscular anomalies detected. Sensory (Neurological): Unimpaired Palpation: No palpable anomalies              Upper Extremity (UE) Exam    Side: Right upper extremity  Side: Left upper extremity  Skin & Extremity Inspection: Skin color, temperature, and hair growth  are WNL. No peripheral edema or cyanosis.   No masses, redness, swelling, asymmetry, or associated skin lesions. No contractures.  Skin & Extremity Inspection: Skin color, temperature, and hair growth are WNL. No peripheral edema or cyanosis. No masses, redness, swelling, asymmetry, or associated skin lesions. No contractures.  Functional ROM: Unrestricted ROM          Functional ROM: Unrestricted ROM          Muscle Tone/Strength: Functionally intact. No obvious neuro-muscular anomalies detected.  Muscle Tone/Strength: Functionally intact. No obvious neuro-muscular anomalies detected.  Sensory (Neurological): Unimpaired          Sensory (Neurological): Unimpaired          Palpation: No palpable anomalies              Palpation: No palpable anomalies              Provocative Test(s):  Phalen's test: deferred Tinel's test: deferred Apley's scratch test (touch opposite shoulder):  Action 1 (Across chest): deferred Action 2 (Overhead): deferred Action 3 (LB reach): deferred   Provocative Test(s):  Phalen's test: deferred Tinel's test: deferred Apley's scratch test (touch opposite shoulder):  Action 1 (Across chest): deferred Action 2 (Overhead): deferred Action 3 (LB reach): deferred    Thoracic Spine Area Exam  Skin & Axial Inspection: No masses, redness, or swelling Alignment: Symmetrical Functional ROM: Unrestricted ROM Stability: No instability detected Muscle Tone/Strength: Functionally intact. No obvious neuro-muscular anomalies detected. Sensory (Neurological): Unimpaired Muscle strength & Tone: No palpable anomalies  Lumbar Spine Area Exam  Skin & Axial Inspection: No masses, redness, or swelling Alignment: Symmetrical Functional ROM: Unrestricted ROM       Stability: No instability detected Muscle Tone/Strength: Functionally intact. No obvious neuro-muscular anomalies detected. Sensory (Neurological): Unimpaired Palpation: No palpable anomalies       Provocative  Tests: Hyperextension/rotation test: deferred today       Lumbar quadrant test (Kemp's test): deferred today       Lateral bending test: deferred today       Patrick's Maneuver: deferred today                   FABER* test: deferred today                   S-I anterior distraction/compression test: deferred today         S-I lateral compression test: deferred today         S-I Thigh-thrust test: deferred today         S-I Gaenslen's test: deferred today         *(Flexion, ABduction and External Rotation)  Gait & Posture Assessment  Ambulation: Unassisted Gait: Relatively normal for age and body habitus Posture: WNL   Lower Extremity Exam    Side: Right lower extremity  Side: Left lower extremity  Stability: No instability observed          Stability: No instability observed          Skin & Extremity Inspection: Skin color, temperature, and hair growth are WNL. No peripheral edema or cyanosis. No masses, redness, swelling, asymmetry, or associated skin lesions. No contractures.  Skin & Extremity Inspection: Skin color, temperature, and hair growth are WNL. No peripheral edema or cyanosis. No masses, redness, swelling, asymmetry, or associated skin lesions. No contractures.  Functional ROM: Unrestricted ROM                  Functional ROM: Unrestricted ROM  Muscle Tone/Strength: Functionally intact. No obvious neuro-muscular anomalies detected.  Muscle Tone/Strength: Functionally intact. No obvious neuro-muscular anomalies detected.  Sensory (Neurological): Unimpaired        Sensory (Neurological): Unimpaired        DTR: Patellar: deferred today Achilles: deferred today Plantar: deferred today  DTR: Patellar: deferred today Achilles: deferred today Plantar: deferred today  Palpation: No palpable anomalies  Palpation: No palpable anomalies   Assessment   Status Diagnosis  Persistent Persistent Persistent 1. Chronic headaches (Primary Area of Pain) (Right)   2.  Chronic ear pain (Right)   3. Atypical facial pain (Right)   4. Chronic jaw pain (Right)   5. Geniculate Neuralgia (Right)   6. History of carotid endarterectomy (Right)   7. Essential hypertension, malignant   8. Neurogenic pain   9. Chronic pain syndrome   10. Pain medication agreement signed      Updated Problems: Problem  History of carotid endarterectomy (Right)  Atypical facial pain (Right)  Chronic ankle pain (Bilateral)  Pain Medication Agreement Signed   Medication agreement with [facility] (Dr.  A. ).     Plan of Care  Pharmacotherapy (Medications Ordered): Meds ordered this encounter  Medications  . Cyanocobalamin (VITAMIN B-12) 5000 MCG SUBL    Sig: Place 1 tablet (5,000 mcg total) under the tongue daily for 30 days.    Dispense:  30 each    Refill:  0    Do not place medication on "Automatic Refill". Fill one day early if pharmacy is closed on scheduled refill date.  . oxyCODONE (OXY IR/ROXICODONE) 5 MG immediate release tablet    Sig: Take 1 tablet (5 mg total) by mouth 2 (two) times daily as needed for up to 30 days for severe pain. Must last 30 days.    Dispense:  60 tablet    Refill:  0    [place] STOP ACT - Not applicable to Chronic Pain Syndrome (G89.4) diagnosis. Fill one day early if pharmacy is closed on scheduled refill date. Do not fill until: 03/04/19. To last until: 04/03/19.   Medications administered today: Erika [name] had no medications administered during this visit.  Orders:  Orders Placed This Encounter  Procedures  . FACIAL NERVE BLOCK    Standing Status:   Standing    Number of Occurrences:   1    Standing Expiration Date:   09/03/2020    Scheduling Instructions:     Side: Right-sided     Sedation: With Sedation.     Timeframe: PRN Procedure. Patient will call.    Order Specific Question:   Where will this procedure be performed?    Answer:   [facility]  . VMA, random  urine  . Nursing Instructions:    Inform patient that follow-up appointment will be rescheduled if studies are not available.    Scheduling Instructions:     Please arrange for the patient to have blood work done today.      Remind patient that Lab orders are good for only 10 days. After 10 days, the orders will be automatically deleted and we will not be resubmitting them.  . Lab instructions    Inform patient that follow-up appointment will be rescheduled if studies are not available.    Scheduling Instructions:     Please arrange for the patient to have blood work done today.     Remind patient that Lab orders are good for only 10 days. After   10 days, the orders will be automatically deleted and we will not be resubmitting them.  . Nursing Instructions:    Scheduling Instructions:     1. Medication Agreement: Please go over agreement with the patient. Have the patient read and sign the agreement. Provide patient with a copy of the signed agreement.     2. Make sure that the patient has completed the ORT (Opioid Risk Tool).     3. Provide the patient with a copy of our "Medicatiion Policy", "Medication Recommendations and Reminders", and "CBD information".     4. Remind the patient to always bring their medications and medication bottles (even if empty) to all appointments except for procedure appointments.  . Nursing communication    Scheduling Instructions:     Complete the opioid risk tool (ORT) questionnaire.    Lab Orders     VMA, random urine Imaging Orders  No imaging studies ordered today   Referral Orders  No referral(s) requested today   Planned follow-up:   Return in about 1 month (around 04/04/2019) for Med-Mgmt, w/ Dr. Dossie Arbour.  Today the patient will be sent to have labs done to check for VMA levels. NOTE: PLAVIX ANTICOAGULATION (Stop: 7-10 days pre-procedure; Restart: 2 hours post-proc.)   Interventional management options: Considering:   Diagnostic right sided  facial nerve block Diagnostic right-sided geniculate nerve block  Diagnostic right-sided occipital nerve block Possible right-sided occipital nerveRFA Diagnostic Botox injections   Palliative PRN treatment(s):   Diagnostic right-sided facial/Geniculate nerve block #3under fluoroscopic guidance and IV sedation   Future Appointments  Date Time Provider [place]  03/05/2019  8:00 AM Milinda Pointer, MD [facility] None  03/17/2019  8:45 AM Bo Merino, MD [facility] None  03/30/2019  9:20 AM Lucille Passy, MD [facility]  04/01/2019  8:45 AM Milinda Pointer, MD [facility] None  04/07/2019  2:30 PM Bo Merino, MD [facility] None  05/11/2019  9:30 AM [facility] [name] [facility]  05/11/2019 11:30 AM [facility] [name] [facility]  05/21/2019  8:40 AM Arfeen, Arlyce Harman, MD [facility] None   Primary Care Physician: Lucille Passy, MD Location: [facility] Note by: Gaspar Cola, MD Date: 03/04/2019; Time: 11:49 AM

## 2019-03-01 NOTE — Progress Notes (Signed)
Subjective:   Patient ID: Erika [name], female    DOB: [birth year], 48 y.o.   MRN: [id number]  Erika [name] is a pleasant 48 y.o. year old female who presents to clinic today with Hospitalization Follow-up (Patient is here today for a hospital F/U.  She was seen at [facility] on 3.4.20 per instruction of Pain Management when she called them about a headache and advised of Systolic being 989.  At the ED her BP was 249/114.  EKG's, CT and labs completed.)  on 03/02/2019  HPI:   Seen in the ED on 02/25/19- note reviewed. Here with her husband, Catalina [place]igua.  She is in tears.  Has been seeing pain management for  Cluster headaches.  Initial nerve block was very helpful but the most recent one she had was not at all helpful.  She is taking Gabapentin as prescribed.  Pain over her right eye and head is so severe that she cannot function.  When her pain is controlled, her blood pressure improves.  Ct Head Wo Contrast  Result Date: 02/25/2019 CLINICAL DATA:  48 year old female with acute headache for 1 day. Initial encounter. EXAM: CT HEAD WITHOUT CONTRAST TECHNIQUE: Contiguous axial images were obtained from the base of the skull through the vertex without intravenous contrast. COMPARISON:  11/03/2018 CT FINDINGS: Brain: No evidence of acute infarction, hemorrhage, hydrocephalus, extra-axial collection or mass lesion/mass effect. Very mild chronic small-vessel white matter ischemic changes again noted. Vascular: Carotid atherosclerotic calcifications noted. Skull: Normal. Negative for fracture or focal lesion. Sinuses/Orbits: No acute finding. Other: None. IMPRESSION: No evidence of acute intracranial abnormality. Electronically Signed   By: Margarette Canada M.D.   On: 02/25/2019 11:37   Dg C-arm 1-60 Min-no Report  Result Date: 02/19/2019 Fluoroscopy was utilized by the requesting physician.  No radiographic interpretation.      Current Outpatient Medications on File Prior to Visit  Medication Sig  Dispense Refill  . Alirocumab (PRALUENT) 150 MG/ML SOAJ Inject 150 mg into the skin every 14 (fourteen) days. 2 pen 11  . aspirin EC 81 MG tablet Take 1 tablet (81 mg total) by mouth daily. 90 tablet 3  . benztropine (COGENTIN) 0.5 MG tablet Take 1 tablet (0.5 mg total) by mouth daily. 90 tablet 0  . carvedilol (COREG) 25 MG tablet TAKE 1 TABLET (25 MG TOTAL) BY MOUTH 2 (TWO) TIMES DAILY WITH A MEAL. 60 tablet 1  . chlorproMAZINE (THORAZINE) 50 MG tablet Take 1 tablet (50 mg total) by mouth at bedtime. 90 tablet 0  . Cholecalciferol (VITAMIN D3) 125 MCG (5000 UT) CAPS Take 1 capsule (5,000 Units total) by mouth daily with breakfast. Take along with calcium and magnesium. 30 capsule 5  . clonazePAM (KLONOPIN) 0.5 MG tablet Take one tab three times a day and 4th as needed 100 tablet 2  . clopidogrel (PLAVIX) 75 MG tablet Take 1 tablet (75 mg total) by mouth daily. 30 tablet 11  . cyclobenzaprine (FEXMID) 7.5 MG tablet TAKE 1 TABLET BY MOUTH 3 TIMES A DAY AS NEEDED FOR MUSCLE SPASMS 60 tablet 5  . ergocalciferol (VITAMIN D2) 1.25 MG (50000 UT) capsule Take 1 capsule (50,000 Units total) by mouth 2 (two) times a week. X 6 weeks. 12 capsule 0  . FLUoxetine HCl 60 MG TABS Take 60 mg by mouth daily. 90 tablet 0  . gabapentin (NEURONTIN) 100 MG capsule Take 1-3 capsules (100-300 mg total) by mouth 4 (four) times daily for 30 days. Follow written titration schedule. 360 capsule  0  . Insulin Glargine (LANTUS SOLOSTAR) 100 UNIT/ML Solostar Pen Inject 40 Units into the skin daily. 5 pen PRN  . insulin NPH-regular Human (NOVOLIN 70/30 RELION) (70-30) 100 UNIT/ML injection Inject 5u SQ bid; but follow flow chart and may increase to 25u depending on flow chart up to tid 10 mL 2  . Insulin Syringe-Needle U-100 29G X 1/2" 0.3 ML MISC 1 each by Does not apply route 2 (two) times daily. 30 each 0  . isosorbide mononitrate (IMDUR) 30 MG 24 hr tablet TAKE 1 TABLET (30 MG TOTAL) BY MOUTH DAILY. PLEASE KEEP APPOINTMENT FOR  FURTHER REFILLS! THANKS :) 90 tablet 3  . lamoTRIgine (LAMICTAL) 200 MG tablet Take 1 tablet (200 mg total) by mouth daily. 90 tablet 0  . losartan (COZAAR) 100 MG tablet Take 1 tablet (100 mg total) by mouth daily. 90 tablet 1  . magic mouthwash SOLN Take 15 mLs by mouth 3 (three) times daily as needed for mouth pain. 250 mL 1  . Magnesium 500 MG CAPS Take 1 capsule (500 mg total) by mouth 2 (two) times daily at 8 am and 10 pm. 60 capsule 5  . naloxone (NARCAN) nasal spray 4 mg/0.1 mL 4 mg (contents of 1 nasal spray) as a single dose in one nostril; may repeat every 2 to 3 minutes in alternating nostrils until medical assistance becomes available 1 kit 0  . nitroGLYCERIN (NITROSTAT) 0.4 MG SL tablet PLACE 1 TABLET (0.4 MG TOTAL) UNDER THE TONGUE EVERY 5 (FIVE) MINUTES AS NEEDED FOR CHEST PAIN. 25 tablet PRN  . ondansetron (ZOFRAN) 4 MG tablet TAKE 1 TABLET (4 MG TOTAL) BY MOUTH DAILY AS NEEDED. 10 tablet 2  . prochlorperazine (COMPAZINE) 10 MG tablet Take 1 tablet (10 mg total) by mouth every 8 (eight) hours as needed (headache). 30 tablet 0  . rosuvastatin (CRESTOR) 10 MG tablet Take 1 tablet (10 mg total) by mouth daily. 90 tablet 3  . spironolactone (ALDACTONE) 25 MG tablet Take 1 tablet (25 mg total) by mouth daily. 30 tablet 5  . calcium carbonate (CALCIUM 600) 600 MG TABS tablet Take 1 tablet (600 mg total) by mouth 2 (two) times daily with a meal for 30 days. 60 tablet 0   No current facility-administered medications on file prior to visit.     No Known Allergies  Past Medical History:  Diagnosis Date  . Arthralgia of temporomandibular joint   . CAD, multiple vessel    a. cath 06/29/16: ostLM 40%, ostLAD 40%, pLAD 95%, ost-pLCx 60%, pLCx 95%, mLCx 60%, mRCA 95%, D2 50%, LVSF nl;  b. 07/2016 CABG x 4 (LIMA->LAD, VG->Diag, VG->OM, VG->RCA); c. 08/2016 Cath: 3VD w/ 4/4 patent grafts. LAD distal to LIMA has diff dzs->Med rx.  . Carotid arterial disease ([place])    a. 07/2016 s/p R CEA.  .  Depression   . Diastolic dysfunction    a. echo 06/28/16: EF 50-55%, mild inf wall HK, GR1DD, mild MR, RV sys fxn nl, mildly dilated LA, PASP nl  . Fatty liver disease, nonalcoholic 4854  . HLD (hyperlipidemia)   . Labile hypertension    a. prior renal ngiogram negative for RAS in 03/2016; b. catecholamines and metanephrines normal, mildly elevated renin with normal aldosterone and normal ratio in 02/2016  . Obesity   . PTSD (post-traumatic stress disorder)   . Tobacco abuse    a. 2018 - cut back from 2 ppd to 0.5 ppd.  . Type 2 diabetes mellitus [facility]) January 2017  Past Surgical History:  Procedure Laterality Date  . ABDOMINAL AORTOGRAM W/LOWER EXTREMITY [name] 10/15/2018   Procedure: ABDOMINAL AORTOGRAM W/LOWER EXTREMITY;  Surgeon: Wellington Hampshire, MD;  Location: [place] [facility];  Service: Cardiovascular;  Laterality: [name];  . CARDIAC CATHETERIZATION [name] 06/29/2016   Procedure: Left Heart Cath and Coronary Angiography;  Surgeon: Minna Merritts, MD;  Location: [place] [facility];  Service: Cardiovascular;  Laterality: [name];  . CARDIAC CATHETERIZATION [name] 08/29/2016   Procedure: Left Heart Cath and Cors/Grafts Angiography;  Surgeon: Wellington Hampshire, MD;  Location: [place] [facility];  Service: Cardiovascular;  Laterality: [name];  . CESAREAN SECTION    . CHOLECYSTECTOMY    . CORONARY ARTERY BYPASS GRAFT [name] 07/06/2016   Procedure: CORONARY ARTERY BYPASS GRAFTING (CABG) x four, using left internal mammary artery and right leg greater saphenous vein harvested endoscopically;  Surgeon: Ivin Poot, MD;  Location: [place];  Service: Open Heart Surgery;  Laterality: [name];  . ENDARTERECTOMY Right 07/06/2016   Procedure: ENDARTERECTOMY CAROTID;  Surgeon: Rosetta Posner, MD;  Location: [place];  Service: Vascular;  Laterality: Right;  . PERIPHERAL VASCULAR CATHETERIZATION [name] 04/18/2016   Procedure: Renal Angiography;  Surgeon: Wellington Hampshire, MD;  Location: [place] [facility];  Service: Cardiovascular;   Laterality: [name];  . PERIPHERAL VASCULAR INTERVENTION Left 10/15/2018   Procedure: PERIPHERAL VASCULAR INTERVENTION;  Surgeon: Wellington Hampshire, MD;  Location: [place] [facility];  Service: Cardiovascular;  Laterality: Left;  Left superficial femoral  . TEE WITHOUT CARDIOVERSION [name] 07/06/2016   Procedure: TRANSESOPHAGEAL ECHOCARDIOGRAM (TEE);  Surgeon: Ivin Poot, MD;  Location: [place];  Service: Open Heart Surgery;  Laterality: [name];  . TONSILLECTOMY      Family History  Adopted: Yes  Problem Relation Age of Onset  . Diabetes Mother   . Diabetes Father   . Alcohol abuse Father   . Heart disease Father   . Drug abuse Father   . Stroke Sister   . Anxiety disorder Sister     Social History   Socioeconomic History  . Marital status: Married    Spouse name: Not on file  . Number of children: 1  . Years of education: 68  . Highest education level: Not on file  Occupational History  . Occupation: Disability  Social Needs  . Financial resource strain: Not on file  . Food insecurity:    Worry: Not on file    Inability: Not on file  . Transportation needs:    Medical: Not on file    Non-medical: Not on file  Tobacco Use  . Smoking status: Current Every Day Smoker    Packs/day: 1.00    Types: Cigarettes    Last attempt to quit: 05/23/2017    Years since quitting: 1.7  . Smokeless tobacco: Never Used  . Tobacco comment: patient smokes when she drives.   Substance and Sexual Activity  . Alcohol use: Yes    Alcohol/week: 0.0 standard drinks    Comment: socially  . Drug use: No  . Sexual activity: Yes    Partners: Male    Birth control/protection: None  Lifestyle  . Physical activity:    Days per week: Not on file    Minutes per session: Not on file  . Stress: Not on file  Relationships  . Social connections:    Talks on phone: Not on file    Gets together: Not on file    Attends religious service: Not on file  Active member of club or organization: Not on file      Attends meetings of clubs or organizations: Not on file    Relationship status: Not on file  . Intimate partner violence:    Fear of current or ex partner: Not on file    Emotionally abused: Not on file    Physically abused: Not on file    Forced sexual activity: Not on file  Other Topics Concern  . Not on file  Social History Narrative   Lives with husband   Caffeine use: Drinks 2 cups coffee per day   Right handed   The PMH, PSH, Social History, Family History, Medications, and allergies have been reviewed in [facility], and have been updated if relevant.   Review of Systems  Constitutional: Negative.   HENT: Negative.   Eyes: Positive for visual disturbance.  Respiratory: Negative.   Cardiovascular: Negative.   Gastrointestinal: Negative.   Musculoskeletal: Negative.   Neurological: Positive for headaches. Negative for dizziness, tremors, seizures, syncope, facial asymmetry, speech difficulty, weakness, light-headedness and numbness.  All other systems reviewed and are negative.      Objective:    BP (!) 175/120 (BP Location: Right Arm, Cuff Size: Normal)   Pulse 85   Temp 98.4 F (36.9 C) (Oral)   Wt 210 lb 9.6 oz (95.5 kg)   LMP 02/12/2019   SpO2 96%   BMI 32.98 kg/m    Physical Exam Vitals signs and nursing note reviewed.  Constitutional:      Comments: Tearful, appears uncomfortable, +photophobia  HENT:     Head: Normocephalic.     Right Ear: External ear normal.     Left Ear: External ear normal.     Mouth/Throat:     Mouth: Mucous membranes are moist.  Cardiovascular:     Rate and Rhythm: Normal rate.     Pulses: Normal pulses.  Pulmonary:     Effort: Pulmonary effort is normal.  Musculoskeletal: Normal range of motion.  Skin:    General: Skin is warm and dry.  Neurological:     General: No focal deficit present.     Mental Status: She is oriented to person, place, and time. Mental status is at baseline.     Cranial Nerves: No cranial nerve  deficit.  Psychiatric:     Comments: Tearful but appropriate           Assessment & Plan:   Chronic headaches (Primary Area of Pain) (Right)  Intractable chronic cluster headache No follow-ups on file.

## 2019-03-02 ENCOUNTER — Encounter: Payer: Self-pay | Admitting: Family Medicine

## 2019-03-02 ENCOUNTER — Ambulatory Visit: Payer: BLUE CROSS/BLUE SHIELD | Admitting: Family Medicine

## 2019-03-02 VITALS — BP 175/120 | HR 85 | Temp 98.4°F | Wt 210.6 lb

## 2019-03-02 DIAGNOSIS — G44021 Chronic cluster headache, intractable: Secondary | ICD-10-CM | POA: Diagnosis not present

## 2019-03-02 DIAGNOSIS — R51 Headache: Secondary | ICD-10-CM | POA: Diagnosis not present

## 2019-03-02 DIAGNOSIS — G8929 Other chronic pain: Secondary | ICD-10-CM

## 2019-03-02 MED ORDER — HYDROCODONE-ACETAMINOPHEN 10-325 MG PO TABS: 1.0000 | ORAL_TABLET | ORAL | 0 refills | Status: DC | PRN

## 2019-03-02 MED ORDER — ALBUTEROL SULFATE HFA 108 (90 BASE) MCG/ACT IN AERS: 2.0000 | INHALATION_SPRAY | Freq: Four times a day (QID) | RESPIRATORY_TRACT | 0 refills | Status: DC | PRN

## 2019-03-02 NOTE — Assessment & Plan Note (Addendum)
>  40 minutes spent in face to face time with patient, >50% spent in counselling or coordination of care discussing pain management.  PDMP reviewed- she has not refilled narcotics anywhere or from any other provider since I referred her to pain management.  Discussed STOP act.  She has a significant cardiac history and I am concerned about the severity of her pain stressing her heart.  Given very short supply of norco and talked her out of canceling her pain management appointment scheduled for this Wednesday.  Advised to continue gabapentin and to use the Norco only to break this cycle of severe pain.  She has narcan at home. The patient indicates understanding of these issues and agrees with the plan.

## 2019-03-03 NOTE — Progress Notes (Signed)
Office Visit Note  Patient: Erika [name]             Date of Birth: 1971/02/15           MRN: [id number]             PCP: Lucille Passy, MD Referring: Lucille Passy, MD Visit Date: 03/17/2019 Occupation: unemployed  Subjective:  Elevated sed rate   History of Present Illness: Erika [name] is a 48 y.o. female addition per request of her PCP.  According to patient in about 3 years ago when she had MI she did physical therapy which was difficult  for her due to  joint pain.  She states after that she started going to a chiropractor but did not have any long-term relief.  She continued to have difficulty walking and could not do much for 1 year.  She describes pain mostly in her legs and her hips.  She states about 1 year ago she was diagnosed with peripheral artery disease and had right lower extremity stent placement.  The lower extremity discomfort persist.  She denies any history of joint swelling or any other joint involvement.  She gets pedal edema at times.  She states about 3 months ago she started going to pain management.  Where she had labs done which came positive for elevated sedimentation rate and she was referred to me.  Activities of Daily Living:  Patient reports morning stiffness for 1 hour.   Patient Denies nocturnal pain.  Difficulty dressing/grooming: Reports Difficulty climbing stairs: Reports Difficulty getting out of chair: Denies Difficulty using hands for taps, buttons, cutlery, and/or writing: Denies  Review of Systems  Constitutional: Positive for fatigue. Negative for night sweats, weight gain and weight loss.  HENT: Positive for mouth dryness. Negative for mouth sores, trouble swallowing, trouble swallowing and nose dryness.   Eyes: Negative for pain, redness, visual disturbance and dryness.  Respiratory: Negative for cough, shortness of breath and difficulty breathing.   Cardiovascular: Positive for swelling in legs/feet. Negative for chest  pain, palpitations, hypertension and irregular heartbeat.  Gastrointestinal: Negative for blood in stool, constipation and diarrhea.  Endocrine: Negative for excessive thirst and increased urination.  Genitourinary: Negative for difficulty urinating and vaginal dryness.  Musculoskeletal: Positive for arthralgias, joint pain, muscle weakness, morning stiffness and muscle tenderness. Negative for joint swelling, myalgias and myalgias.  Skin: Negative for color change, rash, hair loss, skin tightness, ulcers and sensitivity to sunlight.  Allergic/Immunologic: Negative for susceptible to infections.  Neurological: Positive for numbness and weakness. Negative for dizziness, memory loss and night sweats.  Hematological: Positive for bruising/bleeding tendency. Negative for swollen glands.  Psychiatric/Behavioral: Positive for sleep disturbance. Negative for depressed mood. The patient is not nervous/anxious.     PMFS History:  Patient Active Problem List   Diagnosis Date Noted  . History of carotid endarterectomy (Right) 03/04/2019  . Pain medication agreement signed 03/04/2019  . Atypical facial pain (Right) 01/27/2019  . Chronic ear pain (Right) 01/27/2019  . Chronic jaw pain (Right) 01/27/2019  . Geniculate Neuralgia (Right) 01/27/2019  . Vitamin D deficiency 01/19/2019  . Neurogenic pain 01/19/2019  . Chronic anticoagulation (PLAVIX) 01/19/2019  . Chronic pain syndrome 01/07/2019  . Long term current use of opiate analgesic 01/07/2019  . Long term prescription benzodiazepine use 01/07/2019  . Pharmacologic therapy 01/07/2019  . Disorder of skeletal system 01/07/2019  . Problems influencing health status 01/07/2019  . Chronic headaches (Primary Area of Pain) (Right) 01/07/2019  .  Opiate use 09/24/2018  . Insect bite 09/08/2018  . Edema 07/01/2018  . Myalgia 06/24/2018  . Chronic ankle pain (Bilateral) 12/27/2017  . Tendinopathy of right gluteus medius 12/27/2017  . Tendinopathy of  left gluteus medius 12/27/2017  . Bilateral hip pain 12/26/2017  . Other chronic pain 10/17/2017  . Elevated troponin I level 05/21/2017  . Major depressive disorder, recurrent episode, moderate ([place]) 11/19/2016  . Insomnia 10/30/2016  . Hypertension, accelerated with heart disease, without CHF   . Constipation 07/25/2016  . S/P CABG x 4 07/06/2016  . Bradycardia   . CAD (coronary artery disease)   . Carotid stenosis   . CAD in native artery 06/29/2016  . Elevated troponin 06/28/2016  . Essential hypertension, malignant 06/28/2016  . Tobacco abuse 06/28/2016  . Essential hypertension   . Malignant hypertension   . Diabetes ([place])   . Chest pain with high risk for cardiac etiology 06/27/2016  . NSTEMI (non-ST elevated myocardial infarction) ([place]) 06/27/2016  . Proteinuria 03/15/2016  . Renal artery stenosis ([place]) 03/15/2016  . MDD (major depressive disorder) 10/17/2015  . Agoraphobia with panic attacks 04/25/2015  . HTN (hypertension), malignant 10/20/2013  . Cluster headache 03/20/2012  . HLD (hyperlipidemia) 07/26/2010  . ADJUSTMENT DISORDER WITH MIXED FEATURES 07/26/2010    Past Medical History:  Diagnosis Date  . Arthralgia of temporomandibular joint   . CAD, multiple vessel    a. cath 06/29/16: ostLM 40%, ostLAD 40%, pLAD 95%, ost-pLCx 60%, pLCx 95%, mLCx 60%, mRCA 95%, D2 50%, LVSF nl;  b. 07/2016 CABG x 4 (LIMA->LAD, VG->Diag, VG->OM, VG->RCA); c. 08/2016 Cath: 3VD w/ 4/4 patent grafts. LAD distal to LIMA has diff dzs->Med rx.  . Carotid arterial disease ([place])    a. 07/2016 s/p R CEA.  . Depression   . Diastolic dysfunction    a. echo 06/28/16: EF 50-55%, mild inf wall HK, GR1DD, mild MR, RV sys fxn nl, mildly dilated LA, PASP nl  . Fatty liver disease, nonalcoholic 1478  . HLD (hyperlipidemia)   . Labile hypertension    a. prior renal ngiogram negative for RAS in 03/2016; b. catecholamines and metanephrines normal, mildly elevated renin with normal aldosterone and normal  ratio in 02/2016  . Obesity   . PTSD (post-traumatic stress disorder)   . Tobacco abuse    a. 2018 - cut back from 2 ppd to 0.5 ppd.  . Type 2 diabetes mellitus [facility]) January 2017    Family History  Adopted: Yes  Problem Relation Age of Onset  . Diabetes Mother   . Diabetes Father   . Alcohol abuse Father   . Heart disease Father   . Drug abuse Father   . Stroke Sister   . Anxiety disorder Sister    Past Surgical History:  Procedure Laterality Date  . ABDOMINAL AORTOGRAM W/LOWER EXTREMITY [name] 10/15/2018   Procedure: ABDOMINAL AORTOGRAM W/LOWER EXTREMITY;  Surgeon: Wellington Hampshire, MD;  Location: [place] [facility];  Service: Cardiovascular;  Laterality: [name];  . CARDIAC CATHETERIZATION [name] 06/29/2016   Procedure: Left Heart Cath and Coronary Angiography;  Surgeon: Minna Merritts, MD;  Location: [place] [facility];  Service: Cardiovascular;  Laterality: [name];  . CARDIAC CATHETERIZATION [name] 08/29/2016   Procedure: Left Heart Cath and Cors/Grafts Angiography;  Surgeon: Wellington Hampshire, MD;  Location: [place] [facility];  Service: Cardiovascular;  Laterality: [name];  . CESAREAN SECTION    . CHOLECYSTECTOMY    . CORONARY ARTERY BYPASS GRAFT [name] 07/06/2016   Procedure:  CORONARY ARTERY BYPASS GRAFTING (CABG) x four, using left internal mammary artery and right leg greater saphenous vein harvested endoscopically;  Surgeon: Ivin Poot, MD;  Location: [place];  Service: Open Heart Surgery;  Laterality: [name];  . ENDARTERECTOMY Right 07/06/2016   Procedure: ENDARTERECTOMY CAROTID;  Surgeon: Rosetta Posner, MD;  Location: [place];  Service: Vascular;  Laterality: Right;  . PERIPHERAL VASCULAR CATHETERIZATION [name] 04/18/2016   Procedure: Renal Angiography;  Surgeon: Wellington Hampshire, MD;  Location: [place] [facility];  Service: Cardiovascular;  Laterality: [name];  . PERIPHERAL VASCULAR INTERVENTION Left 10/15/2018   Procedure: PERIPHERAL VASCULAR INTERVENTION;  Surgeon: Wellington Hampshire, MD;  Location:  [place] [facility];  Service: Cardiovascular;  Laterality: Left;  Left superficial femoral  . TEE WITHOUT CARDIOVERSION [name] 07/06/2016   Procedure: TRANSESOPHAGEAL ECHOCARDIOGRAM (TEE);  Surgeon: Ivin Poot, MD;  Location: [place];  Service: Open Heart Surgery;  Laterality: [name];  . TONSILLECTOMY     Social History   Social History Narrative   Lives with husband   Caffeine use: Drinks 2 cups coffee per day   Right handed   Immunization History  Administered Date(s) Administered  . Influenza Split 09/29/2012  . Influenza,inj,Quad PF,6+ Mos 10/17/2015, 09/03/2016, 09/17/2017, 09/24/2018  . Pneumococcal Polysaccharide-23 12/26/2017  . Td 07/26/2010     Objective: Vital Signs: BP (!) 160/108 (BP Location: Right Arm, Patient Position: Sitting, Cuff Size: Normal)   Pulse 80   Resp 16   Ht 5\' 7"  (1.702 m)   Wt 203 lb 12.8 oz (92.4 kg)   LMP 03/02/2019   BMI 31.92 kg/m    Physical Exam Vitals signs and nursing note reviewed.  Constitutional:      Appearance: She is well-developed.  HENT:     Head: Normocephalic and atraumatic.  Eyes:     Conjunctiva/sclera: Conjunctivae normal.  Neck:     Musculoskeletal: Normal range of motion.  Cardiovascular:     Rate and Rhythm: Normal rate and regular rhythm.     Heart sounds: Normal heart sounds.  Pulmonary:     Effort: Pulmonary effort is normal.     Breath sounds: Normal breath sounds.  Abdominal:     General: Bowel sounds are normal.     Palpations: Abdomen is soft.  Lymphadenopathy:     Cervical: No cervical adenopathy.  Skin:    General: Skin is warm and dry.     Capillary Refill: Capillary refill takes less than 2 seconds.  Neurological:     Mental Status: She is alert and oriented to person, place, and time.  Psychiatric:        Behavior: Behavior normal.      Musculoskeletal Exam: C-spine thoracic spine good range of motion.  She has some limitation of range of motion of her lumbar spine.  Shoulder joints elbow  joints wrist joint MCPs PIPs DIPs with good range of motion with no synovitis.  Hip joints knee joints ankles MTPs PIPs been good range of motion with no synovitis.  CDAI Exam: CDAI Score: Not documented Patient Global Assessment: Not documented; Provider Global Assessment: Not documented Swollen: Not documented; Tender: Not documented Joint Exam   Not documented   There is currently no information documented on the homunculus. Go to the Rheumatology activity and complete the homunculus joint exam.  Investigation: Findings:  01/07/19: Sed rate 73, CRP 15 01/19/19: ANA negative, RF<10  Component     Latest Ref Rng & Units 01/19/2019  RA Latex Turbid.  0.0 - 13.9 IU/mL <10.0  Anti Nuclear Antibody(ANA)     Negative Negative   Component     Latest Ref Rng & Units 01/07/2019  25-Hydroxy, Vitamin D     ng/mL 16 (L)  25-Hydroxy, Vitamin D-2     ng/mL <1.0  25-Hydroxy, Vitamin D-3     ng/mL 15  Magnesium     1.6 - 2.3 mg/dL 2.0  Vitamin B12     232 - 1,245 pg/mL 476  Sed Rate     0 - 32 mm/hr 73 (H)  CRP     0 - 10 mg/L 15 (H)   Imaging: Ct Head Wo Contrast  Result Date: 02/25/2019 CLINICAL DATA:  48 year old female with acute headache for 1 day. Initial encounter. EXAM: CT HEAD WITHOUT CONTRAST TECHNIQUE: Contiguous axial images were obtained from the base of the skull through the vertex without intravenous contrast. COMPARISON:  11/03/2018 CT FINDINGS: Brain: No evidence of acute infarction, hemorrhage, hydrocephalus, extra-axial collection or mass lesion/mass effect. Very mild chronic small-vessel white matter ischemic changes again noted. Vascular: Carotid atherosclerotic calcifications noted. Skull: Normal. Negative for fracture or focal lesion. Sinuses/Orbits: No acute finding. Other: None. IMPRESSION: No evidence of acute intracranial abnormality. Electronically Signed   By: Margarette Canada M.D.   On: 02/25/2019 11:37   Dg C-arm 1-60 Min-no Report  Result Date:  03/05/2019 Fluoroscopy was utilized by the requesting physician.  No radiographic interpretation.   Dg C-arm 1-60 Min-no Report  Result Date: 02/19/2019 Fluoroscopy was utilized by the requesting physician.  No radiographic interpretation.    Recent Labs: Lab Results  Component Value Date   WBC 10.2 02/25/2019   HGB 16.1 (H) 02/25/2019   PLT 390 02/25/2019   NA 129 (L) 02/25/2019   K 4.9 02/25/2019   CL 94 (L) 02/25/2019   CO2 23 02/25/2019   GLUCOSE 599 (HH) 02/25/2019   BUN 10 02/25/2019   CREATININE 0.62 02/25/2019   BILITOT 0.3 01/07/2019   ALKPHOS 99 01/07/2019   AST 17 01/07/2019   ALT 46 (H) 01/07/2019   PROT 7.8 01/07/2019   ALBUMIN 4.6 01/07/2019   CALCIUM 9.9 02/25/2019   GFRAA >60 02/25/2019    Speciality Comments: No specialty comments available.  Procedures:  No procedures performed Allergies: Patient has no known allergies.   Assessment / Plan:     Visit Diagnoses: Elevated sed rate - 01/07/19: Sed rate 73, CRP 151/27/20: ANA negative, RF<10 -patient has elevated sedimentation rate but no clinical features of joint swelling.  She has no clinical features of polymyalgia rheumatica.  She had no difficulty getting up from chair or difficulty raising her arms.  She had good muscle strength except for in her lower extremity.  It appears that she has decreased muscle mass in her lower extremities which probably is related to deconditioning.  I will repeat her labs today plan: Sedimentation rate, Serum protein electrophoresis with reflex, IgG, IgA, IgM  Elevated C-reactive protein (CRP)  Myalgia -she complains of muscle pain in the lower extremities.  I will obtain following labs today.  Plan: CK, TSH, VITAMIN D 25 Hydroxy (Vit-D Deficiency, Fractures)  Chronic pain syndrome-she has been followed at pain clinic.  Other medical problems are listed as follows:  Long term prescription benzodiazepine use  Long term current use of opiate analgesic  Tendinopathy of  right gluteus medius-chronic pain  Tendinopathy of left gluteus medius-chronic pain  Geniculate Neuralgia (Right)  Intractable chronic cluster headache  Hypertension, accelerated with heart disease, without  CHF  Stenosis of carotid artery, unspecified laterality  NSTEMI (non-ST elevated myocardial infarction) ([place])  Renal artery stenosis ([facility])  Major depressive disorder, recurrent episode, moderate ([facility])  Vitamin D deficiency  Neurogenic pain  Chronic anticoagulation (PLAVIX)  Agoraphobia with panic attacks  History of hyperlipidemia  Smoker   Orders: Orders Placed This Encounter  Procedures  . Sedimentation rate  . CK  . TSH  . VITAMIN D 25 Hydroxy (Vit-D Deficiency, Fractures)  . Serum protein electrophoresis with reflex  . IgG, IgA, IgM   No orders of the defined types were placed in this encounter.   Face-to-face time spent with patient was 45 minutes. Greater than 50% of time was spent in counseling and coordination of care.  Follow-Up Instructions: Return for Elevated sedimentation rate.   Bo Merino, MD  Note - This record has been created using Editor, commissioning.  Chart creation errors have been sought, but may not always  have been located. Such creation errors do not reflect on  the standard of medical care.

## 2019-03-04 ENCOUNTER — Encounter: Payer: Self-pay | Admitting: Family Medicine

## 2019-03-04 ENCOUNTER — Encounter: Payer: Self-pay | Admitting: Pain Medicine

## 2019-03-04 ENCOUNTER — Other Ambulatory Visit: Payer: Self-pay

## 2019-03-04 ENCOUNTER — Ambulatory Visit: Payer: BLUE CROSS/BLUE SHIELD | Attending: Pain Medicine | Admitting: Pain Medicine

## 2019-03-04 VITALS — BP 190/110 | HR 101 | Temp 98.2°F | Ht 67.0 in | Wt 210.0 lb

## 2019-03-04 DIAGNOSIS — G501 Atypical facial pain: Secondary | ICD-10-CM | POA: Diagnosis not present

## 2019-03-04 DIAGNOSIS — R51 Headache: Secondary | ICD-10-CM | POA: Diagnosis not present

## 2019-03-04 DIAGNOSIS — B0221 Postherpetic geniculate ganglionitis: Secondary | ICD-10-CM | POA: Insufficient documentation

## 2019-03-04 DIAGNOSIS — R6884 Jaw pain: Secondary | ICD-10-CM | POA: Diagnosis not present

## 2019-03-04 DIAGNOSIS — Z0289 Encounter for other administrative examinations: Secondary | ICD-10-CM | POA: Insufficient documentation

## 2019-03-04 DIAGNOSIS — Z9889 Other specified postprocedural states: Secondary | ICD-10-CM | POA: Diagnosis not present

## 2019-03-04 DIAGNOSIS — G894 Chronic pain syndrome: Secondary | ICD-10-CM | POA: Diagnosis not present

## 2019-03-04 DIAGNOSIS — G8929 Other chronic pain: Secondary | ICD-10-CM | POA: Insufficient documentation

## 2019-03-04 DIAGNOSIS — I10 Essential (primary) hypertension: Secondary | ICD-10-CM | POA: Diagnosis not present

## 2019-03-04 DIAGNOSIS — H9201 Otalgia, right ear: Secondary | ICD-10-CM | POA: Diagnosis not present

## 2019-03-04 DIAGNOSIS — R519 Headache, unspecified: Secondary | ICD-10-CM

## 2019-03-04 DIAGNOSIS — M792 Neuralgia and neuritis, unspecified: Secondary | ICD-10-CM | POA: Diagnosis not present

## 2019-03-04 MED ORDER — OXYCODONE HCL 5 MG PO TABS: 5.0000 mg | ORAL_TABLET | Freq: Two times a day (BID) | ORAL | 0 refills | Status: DC | PRN

## 2019-03-04 MED ORDER — VITAMIN B-12 5000 MCG SL SUBL: 5000.0000 ug | SUBLINGUAL_TABLET | Freq: Every day | SUBLINGUAL | 0 refills | Status: AC

## 2019-03-04 NOTE — Patient Instructions (Addendum)
______________________________________________________________________________________________  Specialty Pain Scale  Introduction:  There are significant differences in how pain is reported. The word pain usually refers to physical pain, but it is also a common synonym of suffering. The medical community uses a scale from 0 (zero) to 10 (ten) to report pain level. Zero (0) is described as "no pain", while ten (10) is described as "the worse pain you can imagine". The problem with this scale is that physical pain is reported along with suffering. Suffering refers to mental pain, or more often yet it refers to any unpleasant feeling, emotion or aversion associated with the perception of harm or threat of harm. It is the psychological component of pain.  Pain Specialists prefer to separate the two components. The pain scale used by this practice is the Verbal Numerical Rating Scale (VNRS-11). This scale is for the physical pain only. DO NOT INCLUDE how your pain psychologically affects you. This scale is for adults 21 years of age and older. It has 11 (eleven) levels. The 1st level is 0/10. This means: "right now, I have no pain". In the context of pain management, it also means: "right now, my physical pain is under control with the current therapy".  General Information:  The scale should reflect your current level of pain. Unless you are specifically asked for the level of your worst pain, or your average pain. If you are asked for one of these two, then it should be understood that it is over the past 24 hours.  Levels 1 (one) through 5 (five) are described below, and can be treated as an outpatient. Ambulatory pain management facilities such as ours are more than adequate to treat these levels. Levels 6 (six) through 10 (ten) are also described below, however, these must be treated as a hospitalized patient. While levels 6 (six) and 7 (seven) may be evaluated at an urgent care facility, levels 8  (eight) through 10 (ten) constitute medical emergencies and as such, they belong in a hospital's emergency department. When having these levels (as described below), do not come to our office. Our facility is not equipped to manage these levels. Go directly to an urgent care facility or an emergency department to be evaluated.  Definitions:  Activities of Daily Living (ADL): Activities of daily living (ADL or ADLs) is a term used in healthcare to refer to people's daily self-care activities. Health professionals often use a person's ability or inability to perform ADLs as a measurement of their functional status, particularly in regard to people post injury, with disabilities and the elderly. There are two ADL levels: Basic and Instrumental. Basic Activities of Daily Living (BADL  or BADLs) consist of self-care tasks that include: Bathing and showering; personal hygiene and grooming (including brushing/combing/styling hair); dressing; Toilet hygiene (getting to the toilet, cleaning oneself, and getting back up); eating and self-feeding (not including cooking or chewing and swallowing); functional mobility, often referred to as "transferring", as measured by the ability to walk, get in and out of bed, and get into and out of a chair; the broader definition (moving from one place to another while performing activities) is useful for people with different physical abilities who are still able to get around independently. Basic ADLs include the things many people do when they get up in the morning and get ready to go out of the house: get out of bed, go to the toilet, bathe, dress, groom, and eat. On the average, loss of function typically follows a particular order.   Hygiene is the first to go, followed by loss of toilet use and locomotion. The last to go is the ability to eat. When there is only one remaining area in which the person is independent, there is a 62.9% chance that it is eating and only a 3.5% chance  that it is hygiene. Instrumental Activities of Daily Living (IADL or IADLs) are not necessary for fundamental functioning, but they let an individual live independently in a community. IADL consist of tasks that include: cleaning and maintaining the house; home establishment and maintenance; care of others (including selecting and supervising caregivers); care of pets; child rearing; managing money; managing financials (investments, etc.); meal preparation and cleanup; shopping for groceries and necessities; moving within the community; safety procedures and emergency responses; health management and maintenance (taking prescribed medications); and using the telephone or other form of communication.  Instructions:  Most patients tend to report their pain as a combination of two factors, their physical pain and their psychosocial pain. This last one is also known as "suffering" and it is reflection of how physical pain affects you socially and psychologically. From now on, report them separately.  From this point on, when asked to report your pain level, report only your physical pain. Use the following table for reference.  Pain Clinic Pain Levels (0-5/10)  Pain Level Score  Description  No Pain 0   Mild pain 1 Nagging, annoying, but does not interfere with basic activities of daily living (ADL). Patients are able to eat, bathe, get dressed, toileting (being able to get on and off the toilet and perform personal hygiene functions), transfer (move in and out of bed or a chair without assistance), and maintain continence (able to control bladder and bowel functions). Blood pressure and heart rate are unaffected. A normal heart rate for a healthy adult ranges from 60 to 100 bpm (beats per minute).   Mild to moderate pain 2 Noticeable and distracting. Impossible to hide from other people. More frequent flare-ups. Still possible to adapt and function close to normal. It can be very annoying and may have  occasional stronger flare-ups. With discipline, patients may get used to it and adapt.   Moderate pain 3 Interferes significantly with activities of daily living (ADL). It becomes difficult to feed, bathe, get dressed, get on and off the toilet or to perform personal hygiene functions. Difficult to get in and out of bed or a chair without assistance. Very distracting. With effort, it can be ignored when deeply involved in activities.   Moderately severe pain 4 Impossible to ignore for more than a few minutes. With effort, patients may still be able to manage work or participate in some social activities. Very difficult to concentrate. Signs of autonomic nervous system discharge are evident: dilated pupils (mydriasis); mild sweating (diaphoresis); sleep interference. Heart rate becomes elevated (>115 bpm). Diastolic blood pressure (lower number) rises above 100 mmHg. Patients find relief in laying down and not moving.   Severe pain 5 Intense and extremely unpleasant. Associated with frowning face and frequent crying. Pain overwhelms the senses.  Ability to do any activity or maintain social relationships becomes significantly limited. Conversation becomes difficult. Pacing back and forth is common, as getting into a comfortable position is nearly impossible. Pain wakes you up from deep sleep. Physical signs will be obvious: pupillary dilation; increased sweating; goosebumps; brisk reflexes; cold, clammy hands and feet; nausea, vomiting or dry heaves; loss of appetite; significant sleep disturbance with inability to fall asleep or to   remain asleep. When persistent, significant weight loss is observed due to the complete loss of appetite and sleep deprivation.  Blood pressure and heart rate becomes significantly elevated. Caution: If elevated blood pressure triggers a pounding headache associated with blurred vision, then the patient should immediately seek attention at an urgent or emergency care unit, as  these may be signs of an impending stroke.    Emergency Department Pain Levels (6-10/10)  Emergency Room Pain 6 Severely limiting. Requires emergency care and should not be seen or managed at an outpatient pain management facility. Communication becomes difficult and requires great effort. Assistance to reach the emergency department may be required. Facial flushing and profuse sweating along with potentially dangerous increases in heart rate and blood pressure will be evident.   Distressing pain 7 Self-care is very difficult. Assistance is required to transport, or use restroom. Assistance to reach the emergency department will be required. Tasks requiring coordination, such as bathing and getting dressed become very difficult.   Disabling pain 8 Self-care is no longer possible. At this level, pain is disabling. The individual is unable to do even the most "basic" activities such as walking, eating, bathing, dressing, transferring to a bed, or toileting. Fine motor skills are lost. It is difficult to think clearly.   Incapacitating pain 9 Pain becomes incapacitating. Thought processing is no longer possible. Difficult to remember your own name. Control of movement and coordination are lost.   The worst pain imaginable 10 At this level, most patients pass out from pain. When this level is reached, collapse of the autonomic nervous system occurs, leading to a sudden drop in blood pressure and heart rate. This in turn results in a temporary and dramatic drop in blood flow to the brain, leading to a loss of consciousness. Fainting is one of the body's self defense mechanisms. Passing out puts the brain in a calmed state and causes it to shut down for a while, in order to begin the healing process.    Summary: 1.   Refer to this scale when providing [name] with your pain level. 2.   Be accurate and careful when reporting your pain level. This will help with your care. 3.   Over-reporting your pain level  will lead to loss of credibility. 4.   Even a level of 1/10 means that there is pain and will be treated at our facility. 5.   High, inaccurate reporting will be documented as "Symptom Exaggeration", leading to loss of credibility and suspicions of possible secondary gains such as obtaining more narcotics, or wanting to appear disabled, for fraudulent reasons. 6.   Only pain levels of 5 or below will be seen at our facility. 7.   Pain levels of 6 and above will be sent to the Emergency Department and the appointment cancelled.  ______________________________________________________________________________________________    ____________________________________________________________________________________________  Medication Rules  Purpose: To inform patients, and their family members, of our rules and regulations.  Applies to: All patients receiving prescriptions (written or electronic).  Pharmacy of record: Pharmacy where electronic prescriptions will be sent. If written prescriptions are taken to a different pharmacy, please inform the nursing staff. The pharmacy listed in the electronic medical record should be the one where you would like electronic prescriptions to be sent.  Electronic prescriptions: In compliance with the [place] (STOP) Act of 2017 (Session Lanny Cramp [id number]), effective December 24, 2018, all controlled substances must be electronically prescribed. Calling prescriptions to the pharmacy will cease  to exist.  Prescription refills: Only during scheduled appointments. Applies to all prescriptions.  NOTE: The following applies primarily to controlled substances (Opioid* Pain Medications).   Patient's responsibilities: 1. Pain Pills: Bring all pain pills to every appointment (except for procedure appointments). 2. Pill Bottles: Bring pills in original pharmacy bottle. Always bring the newest bottle. Bring bottle, even if  empty. 3. Medication refills: You are responsible for knowing and keeping track of what medications you take and those you need refilled. The day before your appointment: write a list of all prescriptions that need to be refilled. The day of the appointment: give the list to the admitting nurse. Prescriptions will be written only during appointments. No prescriptions will be written on procedure days. If you forget a medication: it will not be "Called in", "Faxed", or "electronically sent". You will need to get another appointment to get these prescribed. No early refills. Do not call asking to have your prescription filled early. 4. Prescription Accuracy: You are responsible for carefully inspecting your prescriptions before leaving our office. Have the discharge nurse carefully go over each prescription with you, before taking them home. Make sure that your name is accurately spelled, that your address is correct. Check the name and dose of your medication to make sure it is accurate. Check the number of pills, and the written instructions to make sure they are clear and accurate. Make sure that you are given enough medication to last until your next medication refill appointment. 5. Taking Medication: Take medication as prescribed. When it comes to controlled substances, taking less pills or less frequently than prescribed is permitted and encouraged. Never take more pills than instructed. Never take medication more frequently than prescribed.  6. Inform other Doctors: Always inform, all of your healthcare providers, of all the medications you take. 7. Pain Medication from other Providers: You are not allowed to accept any additional pain medication from any other Doctor or Healthcare provider. There are two exceptions to this rule. (see below) In the event that you require additional pain medication, you are responsible for notifying us, as stated below. 8. Medication Agreement: You are responsible  for carefully reading and following our Medication Agreement. This must be signed before receiving any prescriptions from our practice. Safely store a copy of your signed Agreement. Violations to the Agreement will result in no further prescriptions. (Additional copies of our Medication Agreement are available upon request.) 9. Laws, Rules, & Regulations: All patients are expected to follow all Federal and State Laws, Statutes, Rules, & Regulations. Ignorance of the Laws does not constitute a valid excuse. The use of any illegal substances is prohibited. 10. Adopted CDC guidelines & recommendations: Target dosing levels will be at or below 60 MME/day. Use of benzodiazepines** is not recommended.  Exceptions: There are only two exceptions to the rule of not receiving pain medications from other Healthcare Providers. 1. Exception #1 (Emergencies): In the event of an emergency (i.e.: accident requiring emergency care), you are allowed to receive additional pain medication. However, you are responsible for: As soon as you are able, call our office [phone], at any time of the day or night, and leave a message stating your name, the date and nature of the emergency, and the name and dose of the medication prescribed. In the event that your call is answered by a member of our staff, make sure to document and save the date, time, and the name of the person that took your information.  2.   Exception #2 (Planned Surgery): In the event that you are scheduled by another doctor or dentist to have any type of surgery or procedure, you are allowed (for a period no longer than 30 days), to receive additional pain medication, for the acute post-op pain. However, in this case, you are responsible for picking up a copy of our "Post-op Pain Management for Surgeons" handout, and giving it to your surgeon or dentist. This document is available at our office, and does not require an appointment to obtain it. Simply go to our  office during business hours (Monday-Thursday from 8:00 AM to 4:00 PM) (Friday 8:00 AM to 12:00 Noon) or if you have a scheduled appointment with [name], prior to your surgery, and ask for it by name. In addition, you will need to provide [name] with your name, name of your surgeon, type of surgery, and date of procedure or surgery.  *Opioid medications include: morphine, codeine, oxycodone, oxymorphone, hydrocodone, hydromorphone, meperidine, tramadol, tapentadol, buprenorphine, fentanyl, methadone. **Benzodiazepine medications include: diazepam (Valium), alprazolam (Xanax), clonazepam (Klonopine), lorazepam (Ativan), clorazepate (Tranxene), chlordiazepoxide (Librium), estazolam (Prosom), oxazepam (Serax), temazepam (Restoril), triazolam (Halcion) (Last updated: 02/20/2018) ____________________________________________________________________________________________   ____________________________________________________________________________________________  Medication Recommendations and Reminders  Applies to: All patients receiving prescriptions (written and/or electronic).  Medication Rules & Regulations: These rules and regulations exist for your safety and that of others. They are not flexible and neither are we. Dismissing or ignoring them will be considered "non-compliance" with medication therapy, resulting in complete and irreversible termination of such therapy. (See document titled "Medication Rules" for more details.) In all conscience, because of safety reasons, we cannot continue providing a therapy where the patient does not follow instructions.  Pharmacy of record:   Definition: This is the pharmacy where your electronic prescriptions will be sent.   We do not endorse any particular pharmacy.  You are not restricted in your choice of pharmacy.  The pharmacy listed in the electronic medical record should be the one where you want electronic prescriptions to be sent.  If you choose  to change pharmacy, simply notify our nursing staff of your choice of new pharmacy.  Recommendations:  Keep all of your pain medications in a safe place, under lock and key, even if you live alone.   After you fill your prescription, take 1 week's worth of pills and put them away in a safe place. You should keep a separate, properly labeled bottle for this purpose. The remainder should be kept in the original bottle. Use this as your primary supply, until it runs out. Once it's gone, then you know that you have 1 week's worth of medicine, and it is time to come in for a prescription refill. If you do this correctly, it is unlikely that you will ever run out of medicine.  To make sure that the above recommendation works, it is very important that you make sure your medication refill appointments are scheduled at least 1 week before you run out of medicine. To do this in an effective manner, make sure that you do not leave the office without scheduling your next medication management appointment. Always ask the nursing staff to show you in your prescription , when your medication will be running out. Then arrange for the receptionist to get you a return appointment, at least 7 days before you run out of medicine. Do not wait until you have 1 or 2 pills left, to come in. This is very poor planning and does not take into consideration  that we may need to cancel appointments due to bad weather, sickness, or emergencies affecting our staff.  "Partial Fill": If for any reason your pharmacy does not have enough pills/tablets to completely fill or refill your prescription, do not allow for a "partial fill". You will need a separate prescription to fill the remaining amount, which we will not provide. If the reason for the partial fill is your insurance, you will need to talk to the pharmacist about payment alternatives for the remaining tablets, but again, do not accept a partial fill.  Prescription refills  and/or changes in medication(s):   Prescription refills, and/or changes in dose or medication, will be conducted only during scheduled medication management appointments. (Applies to both, written and electronic prescriptions.)  No refills on procedure days. No medication will be changed or started on procedure days. No changes, adjustments, and/or refills will be conducted on a procedure day. Doing so will interfere with the diagnostic portion of the procedure.  No phone refills. No medications will be "called into the pharmacy".  No Fax refills.  No weekend refills.  No Holliday refills.  No after hours refills.  Remember:  Business hours are:  Monday to Thursday 8:00 AM to 4:00 PM Provider's Schedule: Dionisio David, NP - Appointments are:  Medication management: Monday to Thursday 8:00 AM to 4:00 PM Milinda Pointer, MD - Appointments are:  Medication management: Monday and Wednesday 8:00 AM to 4:00 PM Procedure day: Tuesday and Thursday 7:30 AM to 4:00 PM Gillis Santa, MD - Appointments are:  Medication management: Tuesday and Thursday 8:00 AM to 4:00 PM Procedure day: Monday and Wednesday 7:30 AM to 4:00 PM (Last update: 02/20/2018) ____________________________________________________________________________________________   ____________________________________________________________________________________________  CANNABIDIOL (AKA: CBD Oil or Pills)  Applies to: All patients receiving prescriptions of controlled substances (written and/or electronic).  General Information: Cannabidiol (CBD) was discovered in 56. It is one of some 113 identified cannabinoids in cannabis (Marijuana) plants, accounting for up to 40% of the plant's extract. As of 2018, preliminary clinical research on cannabidiol included studies of anxiety, cognition, movement disorders, and pain.  Cannabidiol is consummed in multiple ways, including inhalation of cannabis smoke or vapor, as an aerosol  spray into the cheek, and by mouth. It may be supplied as CBD oil containing CBD as the active ingredient (no added tetrahydrocannabinol (THC) or terpenes), a full-plant CBD-dominant hemp extract oil, capsules, dried cannabis, or as a liquid solution. CBD is thought not have the same psychoactivity as THC, and may affect the actions of THC. Studies suggest that CBD may interact with different biological targets, including cannabinoid receptors and other neurotransmitter receptors. As of 2018 the mechanism of action for its biological effects has not been determined.  In the [place], cannabidiol has a limited approval by the Food and Drug Administration (FDA) for treatment of only two types of epilepsy disorders. The side effects of long-term use of the drug include somnolence, decreased appetite, diarrhea, fatigue, malaise, weakness, sleeping problems, and others.  CBD remains a Schedule I drug prohibited for any use.  Legality: Some manufacturers ship CBD products nationally, an illegal action which the FDA has not enforced in 2018, with CBD remaining the subject of an FDA investigational new drug evaluation, and is not considered legal as a dietary supplement or food ingredient as of December 2018. Federal illegality has made it difficult historically to conduct research on CBD. CBD is openly sold in head shops and health food stores in some states where such sales have  not been explicitly legalized.  Warning: Because it is not FDA approved for general use or treatment of pain, it is not required to undergo the same manufacturing controls as prescription drugs.  This means that the available cannabidiol (CBD) may be contaminated with THC.  If this is the case, it will trigger a positive urine drug screen (UDS) test for cannabinoids (Marijuana).  Because a positive UDS for illicit substances is a violation of our medication agreement, your opioid analgesics (pain medicine) may be permanently  discontinued. (Last update: 03/13/2018) ____________________________________________________________________________________________   ____________________________________________________________________________________________  Drug Holidays (Slow)  What is a "Drug Holiday"? Drug Holiday: is the name given to the period of time during which a patient stops taking a medication(s) for the purpose of eliminating tolerance to the drug.  Benefits . Improved effectiveness of opioids. . Decreased opioid dose needed to achieve benefits. . Improved pain with lesser dose.  What is tolerance? Tolerance: is the progressive decreased in effectiveness of a drug due to its repetitive use. With repetitive use, the body gets use to the medication and as a consequence, it loses its effectiveness. This is a common problem seen with opioid pain medications. As a result, a larger dose of the drug is needed to achieve the same effect that used to be obtained with a smaller dose.  How long should a "Drug Holiday" last? At least 14 consecutive days. (2 weeks)  What are withdrawals? Withdrawals: refers to the wide range of symptoms that occur after stopping or dramatically reducing opiate drugs after heavy and prolonged use. Withdrawal symptoms do not occur to patients that use low dose opioids, or those who take the medication sporadically. Contrary to benzodiazepine (example: Valium, Xanax, etc.) or alcohol withdrawals ("Delirium Tremens"), opioid withdrawals are not lethal. Withdrawals are the physical manifestation of the body getting rid of the excess receptors.  Expected Symptoms Early symptoms of withdrawal may include: . Agitation . Anxiety . Muscle aches . Increased tearing . Insomnia . Runny nose . Sweating . Yawning  Late symptoms of withdrawal may include: . Abdominal cramping . Diarrhea . Dilated pupils . Goose bumps . Nausea . Vomiting  Will I experience withdrawals? Due to the  slow nature of the taper, it is very unlikely that you will experience any.  What is a slow taper? Taper: refers to the gradual decrease in dose. ___________________________________________________________________________________________

## 2019-03-05 ENCOUNTER — Other Ambulatory Visit: Payer: Self-pay

## 2019-03-05 ENCOUNTER — Ambulatory Visit (HOSPITAL_BASED_OUTPATIENT_CLINIC_OR_DEPARTMENT_OTHER): Payer: BLUE CROSS/BLUE SHIELD | Admitting: Pain Medicine

## 2019-03-05 ENCOUNTER — Encounter: Payer: Self-pay | Admitting: Pain Medicine

## 2019-03-05 ENCOUNTER — Ambulatory Visit
Admission: RE | Admit: 2019-03-05 | Discharge: 2019-03-05 | Disposition: A | Discharge status: Home / Self Care | Payer: BLUE CROSS/BLUE SHIELD | Source: Ambulatory Visit | Attending: Pain Medicine | Admitting: Pain Medicine

## 2019-03-05 VITALS — BP 130/80 | HR 76 | Temp 98.1°F | Resp 16 | Wt 210.0 lb

## 2019-03-05 DIAGNOSIS — Z7901 Long term (current) use of anticoagulants: Secondary | ICD-10-CM

## 2019-03-05 DIAGNOSIS — G8929 Other chronic pain: Secondary | ICD-10-CM | POA: Insufficient documentation

## 2019-03-05 DIAGNOSIS — R51 Headache: Secondary | ICD-10-CM | POA: Insufficient documentation

## 2019-03-05 DIAGNOSIS — B0221 Postherpetic geniculate ganglionitis: Secondary | ICD-10-CM | POA: Insufficient documentation

## 2019-03-05 DIAGNOSIS — H9201 Otalgia, right ear: Secondary | ICD-10-CM

## 2019-03-05 DIAGNOSIS — G511 Geniculate ganglionitis: Secondary | ICD-10-CM | POA: Diagnosis not present

## 2019-03-05 DIAGNOSIS — G501 Atypical facial pain: Secondary | ICD-10-CM | POA: Diagnosis not present

## 2019-03-05 MED ORDER — DEXAMETHASONE SODIUM PHOSPHATE 10 MG/ML IJ SOLN
INTRAMUSCULAR | Status: AC
  Filled 2019-03-05: qty 1

## 2019-03-05 MED ORDER — ROPIVACAINE HCL 2 MG/ML IJ SOLN
3.0000 mL | Freq: Once | INTRAMUSCULAR | Status: AC
  Administered 2019-03-05: 3 mL via EPIDURAL

## 2019-03-05 MED ORDER — FENTANYL CITRATE (PF) 100 MCG/2ML IJ SOLN
25.0000 ug | INTRAMUSCULAR | Status: DC | PRN
  Administered 2019-03-05: 50 ug via INTRAVENOUS

## 2019-03-05 MED ORDER — DEXAMETHASONE SODIUM PHOSPHATE 10 MG/ML IJ SOLN
10.0000 mg | Freq: Once | INTRAMUSCULAR | Status: AC
  Administered 2019-03-05: 10 mg

## 2019-03-05 MED ORDER — LIDOCAINE HCL 2 % IJ SOLN
INTRAMUSCULAR | Status: AC
  Filled 2019-03-05: qty 20

## 2019-03-05 MED ORDER — ROPIVACAINE HCL 2 MG/ML IJ SOLN
INTRAMUSCULAR | Status: AC
  Filled 2019-03-05: qty 10

## 2019-03-05 MED ORDER — MIDAZOLAM HCL 5 MG/5ML IJ SOLN
INTRAMUSCULAR | Status: AC
  Filled 2019-03-05: qty 5

## 2019-03-05 MED ORDER — LACTATED RINGERS IV SOLN
1000.0000 mL | Freq: Once | INTRAVENOUS | Status: AC
  Administered 2019-03-05: 1000 mL via INTRAVENOUS

## 2019-03-05 MED ORDER — LIDOCAINE HCL 2 % IJ SOLN
20.0000 mL | Freq: Once | INTRAMUSCULAR | Status: AC
  Administered 2019-03-05: 400 mg

## 2019-03-05 MED ORDER — MIDAZOLAM HCL 5 MG/5ML IJ SOLN
1.0000 mg | INTRAMUSCULAR | Status: DC | PRN
  Administered 2019-03-05: 2 mg via INTRAVENOUS

## 2019-03-05 MED ORDER — FENTANYL CITRATE (PF) 100 MCG/2ML IJ SOLN
INTRAMUSCULAR | Status: AC
  Filled 2019-03-05: qty 2

## 2019-03-05 NOTE — Patient Instructions (Signed)
____________________________________________________________________________________________  Post-Procedure Discharge Instructions  Instructions:  Apply ice:   Purpose: This will minimize any swelling and discomfort after procedure.   When: Day of procedure, as soon as you get home.  How: Fill a plastic sandwich bag with crushed ice. Cover it with a small towel and apply to injection site.  How long: (15 min on, 15 min off) Apply for 15 minutes then remove x 15 minutes.  Repeat sequence on day of procedure, until you go to bed.  Apply heat:   Purpose: To treat any soreness and discomfort from the procedure.  When: Starting the next day after the procedure.  How: Apply heat to procedure site starting the day following the procedure.  How long: May continue to repeat daily, until discomfort goes away.  Food intake: Start with clear liquids (like water) and advance to regular food, as tolerated.   Physical activities: Keep activities to a minimum for the first 8 hours after the procedure. After that, then as tolerated.  Driving: If you have received any sedation, be responsible and do not drive. You are not allowed to drive for 24 hours after having sedation.  Blood thinner: (Applies only to those taking blood thinners) You may restart your blood thinner 6 hours after your procedure.  Insulin: (Applies only to Diabetic patients taking insulin) As soon as you can eat, you may resume your normal dosing schedule.  Infection prevention: Keep procedure site clean and dry. Shower daily and clean area with soap and water.  Post-procedure Pain Diary: Extremely important that this be done correctly and accurately. Recorded information will be used to determine the next step in treatment. For the purpose of accuracy, follow these rules:  Evaluate only the area treated. Do not report or include pain from an untreated area. For the purpose of this evaluation, ignore all other areas of pain,  except for the treated area.  After your procedure, avoid taking a long nap and attempting to complete the pain diary after you wake up. Instead, set your alarm clock to go off every hour, on the hour, for the initial 8 hours after the procedure. Document the duration of the numbing medicine, and the relief you are getting from it.  Do not go to sleep and attempt to complete it later. It will not be accurate. If you received sedation, it is likely that you were given a medication that may cause amnesia. Because of this, completing the diary at a later time may cause the information to be inaccurate. This information is needed to plan your care.  Follow-up appointment: Keep your post-procedure follow-up evaluation appointment after the procedure (usually 2 weeks for most procedures, 6 weeks for radiofrequencies). DO NOT FORGET to bring you pain diary with you.   Expect: (What should I expect to see with my procedure?)  From numbing medicine (AKA: Local Anesthetics): Numbness or decrease in pain. You may also experience some weakness, which if present, could last for the duration of the local anesthetic.  Onset: Full effect within 15 minutes of injected.  Duration: It will depend on the type of local anesthetic used. On the average, 1 to 8 hours.   From steroids (Applies only if steroids were used): Decrease in swelling or inflammation. Once inflammation is improved, relief of the pain will follow.  Onset of benefits: Depends on the amount of swelling present. The more swelling, the longer it will take for the benefits to be seen. In some cases, up to 10 days.    Duration: Steroids will stay in the system x 2 weeks. Duration of benefits will depend on multiple posibilities including persistent irritating factors.  Side-effects: If present, they may typically last 2 weeks (the duration of the steroids).  Frequent: Cramps (if they occur, drink Gatorade and take over-the-counter Magnesium 450-500 mg  once to twice a day); water retention with temporary weight gain; increases in blood sugar; decreased immune system response; increased appetite.  Occasional: Facial flushing (red, warm cheeks); mood swings; menstrual changes.  Uncommon: Long-term decrease or suppression of natural hormones; bone thinning. (These are more common with higher doses or more frequent use. This is why we prefer that our patients avoid having any injection therapies in other practices.)   Very Rare: Severe mood changes; psychosis; aseptic necrosis.  From procedure: Some discomfort is to be expected once the numbing medicine wears off. This should be minimal if ice and heat are applied as instructed.  Call if: (When should I call?)  You experience numbness and weakness that gets worse with time, as opposed to wearing off.  New onset bowel or bladder incontinence. (Applies only to procedures done in the spine)  Emergency Numbers:  Durning business hours (Monday - Thursday, 8:00 AM - 4:00 PM) (Friday, 9:00 AM - 12:00 Noon): [phone]  After hours: [phone]  NOTE: If you are having a problem and are unable connect with, or to talk to a provider, then go to your nearest urgent care or emergency department. If the problem is serious and urgent, please call 911. ____________________________________________________________________________________________    

## 2019-03-05 NOTE — Progress Notes (Signed)
Patient's Name: Erika [name]  MRN: [id number]  Referring Provider: Lucille Passy, MD  DOB: 10/14/1971  PCP: Lucille Passy, MD  DOS: 03/05/2019  Note by: Gaspar Cola, MD  Service setting: Ambulatory outpatient  Specialty: Interventional Pain Management  Patient type: Established  Location: [facility] (AMB) Pain Management Facility  Visit type: Interventional Procedure   Primary Reason for Visit: Interventional Pain Management Treatment. CC: Headache (r. temporal)  Procedure:          Anesthesia, Analgesia, Anxiolysis:  Type: Diagnostic Facial (CN VII) nerve block  #3  Region: Face Level: Anterior border of mastoid process, immediately below the external auditory meatus and at the level of the middle of the ramus of the mandible (CN VII)   Laterality: Right-Sided  Type: Moderate (Conscious) Sedation combined with Local Anesthesia Indication(s): Analgesia and Anxiety Route: Intravenous (IV) IV Access: Secured Sedation: Meaningful verbal contact was maintained at all times during the procedure  Local Anesthetic: Lidocaine 1-2%  Position: Left lateral decubitus   Indications: 1. Atypical facial pain (Right)   2. Chronic ear pain (Right)   3. Chronic headaches (Primary Area of Pain) (Right)   4. Geniculate Neuralgia (Right)   5. Chronic anticoagulation (PLAVIX)    Pain Score: Pre-procedure: 0-No pain/10 Post-procedure: 6 /10  Pre-op Assessment:  Ms. Outten is a 48 y.o. (year old), female patient, seen today for interventional treatment. She  has a past surgical history that includes Cholecystectomy; Tonsillectomy; Cesarean section; Cardiac catheterization ([name], 04/18/2016); Cardiac catheterization ([name], 06/29/2016); Coronary artery bypass graft ([name], 07/06/2016); TEE without cardioversion ([name], 07/06/2016); Endarterectomy (Right, 07/06/2016); Cardiac catheterization ([name], 08/29/2016); ABDOMINAL AORTOGRAM W/LOWER EXTREMITY ([name], 10/15/2018); and PERIPHERAL VASCULAR INTERVENTION (Left,  10/15/2018). Ms. Morrissette has a current medication list which includes the following prescription(s): albuterol, alirocumab, aspirin ec, benztropine, carvedilol, chlorpromazine, vitamin d3, clonazepam, clopidogrel, vitamin b-12, cyclobenzaprine, fluoxetine hcl, gabapentin, insulin glargine, insulin nph-regular human, isosorbide mononitrate, lamotrigine, losartan, magic mouthwash, magnesium, naloxone, nitroglycerin, ondansetron, oxycodone, prochlorperazine, rosuvastatin, spironolactone, calcium carbonate, and insulin syringe-needle u-100, and the following Facility-Administered Medications: fentanyl, lactated ringers, and midazolam. Her primarily concern today is the Headache (r. temporal)  Initial Vital Signs:  Pulse/HCG Rate: 76ECG Heart Rate: 78 Temp: 98.3 F (36.8 C) Resp: 18 BP: 132/62 SpO2: 95 %  BMI: Estimated body mass index is 32.89 kg/m as calculated from the following:   Height as of 03/04/19: 5\' 7"  (1.702 m).   Weight as of this encounter: 210 lb (95.3 kg).  Risk Assessment: Allergies: Reviewed. She has No Known Allergies.  Allergy Precautions: None required Coagulopathies: Reviewed. None identified.  Blood-thinner therapy: None at this time Active Infection(s): Reviewed. None identified. Ms. Attwood is afebrile  Site Confirmation: Ms. Astle was asked to confirm the procedure and laterality before marking the site Procedure checklist: Completed Consent: Before the procedure and under the influence of no sedative(s), amnesic(s), or anxiolytics, the patient was informed of the treatment options, risks and possible complications. To fulfill our ethical and legal obligations, as recommended by the American Medical Association's Code of Ethics, I have informed the patient of my clinical impression; the nature and purpose of the treatment or procedure; the risks, benefits, and possible complications of the intervention; the alternatives, including doing nothing; the risk(s) and  benefit(s) of the alternative treatment(s) or procedure(s); and the risk(s) and benefit(s) of doing nothing. The patient was provided information about the general risks and possible complications associated with the procedure. These may include, but are not limited to: failure to achieve desired  goals, infection, bleeding, organ or nerve damage, allergic reactions, paralysis, and death. In addition, the patient was informed of those risks and complications associated to the procedure, such as failure to decrease pain; infection; bleeding; organ or nerve damage with subsequent damage to sensory, motor, and/or autonomic systems, resulting in permanent pain, numbness, and/or weakness of one or several areas of the body; allergic reactions; (i.e.: anaphylactic reaction); and/or death. Furthermore, the patient was informed of those risks and complications associated with the medications. These include, but are not limited to: allergic reactions (i.e.: anaphylactic or anaphylactoid reaction(s)); adrenal axis suppression; blood sugar elevation that in diabetics may result in ketoacidosis or comma; water retention that in patients with history of congestive heart failure may result in shortness of breath, pulmonary edema, and decompensation with resultant heart failure; weight gain; swelling or edema; medication-induced neural toxicity; particulate matter embolism and blood vessel occlusion with resultant organ, and/or nervous system infarction; and/or aseptic necrosis of one or more joints. Finally, the patient was informed that Medicine is not an exact science; therefore, there is also the possibility of unforeseen or unpredictable risks and/or possible complications that may result in a catastrophic outcome. The patient indicated having understood very clearly. We have given the patient no guarantees and we have made no promises. Enough time was given to the patient to ask questions, all of which were answered to  the patient's satisfaction. Ms. Nolet has indicated that she wanted to continue with the procedure. Attestation: I, the ordering provider, attest that I have discussed with the patient the benefits, risks, side-effects, alternatives, likelihood of achieving goals, and potential problems during recovery for the procedure that I have provided informed consent. Date   Time: 03/05/2019  8:50 AM  Pre-Procedure Preparation:  Monitoring: As per clinic protocol. Respiration, ETCO2, SpO2, BP, heart rate and rhythm monitor placed and checked for adequate function Safety Precautions: Patient was assessed for positional comfort and pressure points before starting the procedure. Time-out: I initiated and conducted the "Time-out" before starting the procedure, as per protocol. The patient was asked to participate by confirming the accuracy of the "Time Out" information. Verification of the correct person, site, and procedure were performed and confirmed by me, the nursing staff, and the patient. "Time-out" conducted as per Joint Commission's Universal Protocol (UP.01.01.01). Time: 0904  Description of Procedure:          Target Area: The lateral pterygoid plate, perpendicular to the base of the skull, under the zygomatic arch, and at the midpoint of the mandibular notch. Approach: Anterior approach Area Prepped: Entire one side of the face, from the lower orbital ridge down Prepping solution: Alcohol Prep Safety Precautions: Aspiration looking for blood return was conducted prior to all injections. At no point did we inject any substances, as a needle was being advanced. No attempts were made at seeking any paresthesias. Safe injection practices and needle disposal techniques used. Medications properly checked for expiration dates. SDV (single dose vial) medications used. Description of the Procedure: Protocol guidelines were followed. The target area was identified and the area prepped in the usual manner.  Skin & deeper tissues infiltrated with local anesthetic. Appropriate amount of time allowed to pass for local anesthetics to take effect. The procedure needles were then advanced to the target area. Proper needle placement secured. Negative aspiration confirmed. Solution injected in intermittent fashion, asking for systemic symptoms every 0.5cc of injectate. The needles were then removed and the area cleansed, making sure to leave some of the prepping  solution back to take advantage of its long term bactericidal properties.              Vitals:   03/05/19 0910 03/05/19 0916 03/05/19 0926 03/05/19 0936  BP: (!) 118/58 132/79 132/60 130/80  Pulse:      Resp: 16 16 16 16   Temp:  98.3 F (36.8 C)  98.1 F (36.7 C)  TempSrc:  Temporal  Temporal  SpO2: 95% 96% 97% 98%  Weight:        Start Time: 0904 hrs. End Time: 0909 hrs. Materials:  Needle(s) Type: Spinal Needle Gauge: 25G Length: 1.5-in Medication(s): Please see orders for medications and dosing details.  Imaging Guidance (Non-Spinal):          Type of Imaging Technique: Fluoroscopy Guidance (Non-Spinal) Indication(s): Assistance in needle guidance and placement for procedures requiring needle placement in or near specific anatomical locations not easily accessible without such assistance. Exposure Time: Please see nurses notes. Contrast: None used. Fluoroscopic Guidance: I was personally present during the use of fluoroscopy. "Tunnel Vision Technique" used to obtain the best possible view of the target area. Parallax error corrected before commencing the procedure. "Direction-depth-direction" technique used to introduce the needle under continuous pulsed fluoroscopy. Once target was reached, antero-posterior, oblique, and lateral fluoroscopic projection used confirm needle placement in all planes. Images permanently stored in EMR. Interpretation: No contrast injected. I personally interpreted the imaging intraoperatively. Adequate  needle placement confirmed in multiple planes. Permanent images saved into the patient's record.  Antibiotic Prophylaxis:   Anti-infectives (From admission, onward)   None     Indication(s): None identified  Post-operative Assessment:  Post-procedure Vital Signs:  Pulse/HCG Rate: 7673 Temp: 98.1 F (36.7 C) Resp: 16 BP: 130/80 SpO2: 98 %  EBL: None  Complications: No immediate post-treatment complications observed by team, or reported by patient.  Note: The patient tolerated the entire procedure well. A repeat set of vitals were taken after the procedure and the patient was kept under observation following institutional policy, for this type of procedure. Post-procedural neurological assessment was performed, showing return to baseline, prior to discharge. The patient was provided with post-procedure discharge instructions, including a section on how to identify potential problems. Should any problems arise concerning this procedure, the patient was given instructions to immediately contact us, at any time, without hesitation. In any case, we plan to contact the patient by telephone for a follow-up status report regarding this interventional procedure.  Comments:  No additional relevant information.  Plan of Care  Orders:  Orders Placed This Encounter  Procedures   FACIAL NERVE BLOCK    Scheduling Instructions:     Side: Right-sided     Sedation: With Sedation.     Timeframe: Today    Order Specific Question:   Where will this procedure be performed?    Answer:   [facility]   DG C-Arm 1-60 Min-No Report    Intraoperative interpretation by procedural physician at [place].    Standing Status:   Standing    Number of Occurrences:   1    Order Specific Question:   Reason for exam:    Answer:   Assistance in needle guidance and placement for procedures requiring needle placement in or near specific anatomical locations not easily accessible without  such assistance.   Care order/instruction:    Scheduling Instructions:     Please confirm that the patient has stopped the Plavix (Clopidogrel) x 7-10 days prior to procedure or surgery.  Provider attestation of informed consent for procedure/surgical case    I, the ordering provider, attest that I have discussed with the patient the benefits, risks, side effects, alternatives, likelihood of achieving goals and potential problems during recovery for the procedure that I have provided informed consent.   Informed Consent Details: Transcribe to consent form and obtain patient signature    Consent Attestation: I, the ordering provider, attest that I have discussed with the patient the benefits, risks, side-effects, alternatives, likelihood of achieving goals, and potential problems during recovery for the procedure that I have provided informed consent.    Standing Status:   Standing    Number of Occurrences:   1    Order Specific Question:   Procedure    Answer:   Right-sided Facial Nerve Block    Order Specific Question:   Surgeon    Answer:   Cherylanne Ardelean A. Dossie Arbour, MD    Order Specific Question:   Indication/Reason    Answer:   Right-sided Facial pain over the distribution of the Facial Nerve   Bleeding precautions   Medications ordered for procedure: Meds ordered this encounter  Medications   lidocaine (XYLOCAINE) 2 % (with pres) injection 400 mg   midazolam (VERSED) 5 MG/5ML injection 1-2 mg    Make sure Flumazenil is available in the pyxis when using this medication. If oversedation occurs, administer 0.2 mg IV over 15 sec. If after 45 sec no response, administer 0.2 mg again over 1 min; may repeat at 1 min intervals; not to exceed 4 doses (1 mg)   fentaNYL (SUBLIMAZE) injection 25-50 mcg    Make sure Narcan is available in the pyxis when using this medication. In the event of respiratory depression (RR< 8/min): Titrate NARCAN (naloxone) in increments of 0.1 to 0.2 mg IV at 2-3  minute intervals, until desired degree of reversal.   lactated ringers infusion 1,000 mL   ropivacaine (PF) 2 mg/mL (0.2%) (NAROPIN) injection 3 mL   dexamethasone (DECADRON) injection 10 mg   Medications administered: We administered lidocaine, midazolam, fentaNYL, lactated ringers, ropivacaine (PF) 2 mg/mL (0.2%), and dexamethasone.  See the medical record for exact dosing, route, and time of administration.  Disposition: Discharge home  Discharge Date & Time: 03/05/2019; 0940 hrs.   Follow-up plan:   Return for PPE (2 wks) w/ Dr. Dossie Arbour.     Future Appointments  Date Time Provider [place]  03/17/2019  8:45 AM Bo Merino, MD [facility] None  03/30/2019  9:20 AM Lucille Passy, MD [facility]  04/01/2019  8:45 AM Milinda Pointer, MD [facility] None  04/07/2019  2:30 PM Bo Merino, MD [facility] None  05/11/2019  9:30 AM [facility] [name] [facility]  05/11/2019 11:30 AM [facility] [name] [facility]  05/21/2019  8:40 AM Arfeen, Arlyce Harman, MD [facility] None   Primary Care Physician: Lucille Passy, MD Location: [facility] Note by: Gaspar Cola, MD Date: 03/05/2019; Time: 11:41 AM  Disclaimer:  Medicine is not an exact science. The only guarantee in medicine is that nothing is guaranteed. It is important to note that the decision to proceed with this intervention was based on the information collected from the patient. The Data and conclusions were drawn from the patient's questionnaire, the interview, and the physical examination. Because the information was provided in large part by the patient, it cannot be guaranteed that it has not been purposely or unconsciously manipulated. Every effort has been made to obtain as  much relevant data as possible for this evaluation. It is important to note that the conclusions that lead to this procedure are derived in large part from the available data. Always take into account that the  treatment will also be dependent on availability of resources and existing treatment guidelines, considered by other Pain Management Practitioners as being common knowledge and practice, at the time of the intervention. For Medico-Legal purposes, it is also important to point out that variation in procedural techniques and pharmacological choices are the acceptable norm. The indications, contraindications, technique, and results of the above procedure should only be interpreted and judged by a Board-Certified Interventional Pain Specialist with extensive familiarity and expertise in the same exact procedure and technique.

## 2019-03-06 ENCOUNTER — Telehealth: Payer: Self-pay

## 2019-03-06 DIAGNOSIS — F331 Major depressive disorder, recurrent, moderate: Secondary | ICD-10-CM | POA: Diagnosis not present

## 2019-03-06 DIAGNOSIS — F438 Other reactions to severe stress: Secondary | ICD-10-CM | POA: Diagnosis not present

## 2019-03-06 NOTE — Telephone Encounter (Signed)
Post procedure phone call. Patient states she is doing [place] today.

## 2019-03-08 LAB — VMA, RANDOM URINE
Creatinine, Random U: 64.9 mg/dL
VMA, Random Urine: 1.5 mg/L
VMA/Crt, Random U: 2.3 mg/g Creat (ref 0.0–6.0)

## 2019-03-10 ENCOUNTER — Other Ambulatory Visit: Payer: Self-pay | Admitting: Pain Medicine

## 2019-03-10 DIAGNOSIS — E559 Vitamin D deficiency, unspecified: Secondary | ICD-10-CM

## 2019-03-17 ENCOUNTER — Ambulatory Visit: Payer: BLUE CROSS/BLUE SHIELD | Admitting: Rheumatology

## 2019-03-17 ENCOUNTER — Encounter: Payer: Self-pay | Admitting: Rheumatology

## 2019-03-17 ENCOUNTER — Other Ambulatory Visit: Payer: Self-pay

## 2019-03-17 VITALS — BP 160/108 | HR 80 | Resp 16 | Ht 67.0 in | Wt 203.8 lb

## 2019-03-17 DIAGNOSIS — Z7901 Long term (current) use of anticoagulants: Secondary | ICD-10-CM

## 2019-03-17 DIAGNOSIS — R700 Elevated erythrocyte sedimentation rate: Secondary | ICD-10-CM | POA: Diagnosis not present

## 2019-03-17 DIAGNOSIS — R7982 Elevated C-reactive protein (CRP): Secondary | ICD-10-CM

## 2019-03-17 DIAGNOSIS — B0221 Postherpetic geniculate ganglionitis: Secondary | ICD-10-CM

## 2019-03-17 DIAGNOSIS — Z79899 Other long term (current) drug therapy: Secondary | ICD-10-CM

## 2019-03-17 DIAGNOSIS — I6529 Occlusion and stenosis of unspecified carotid artery: Secondary | ICD-10-CM

## 2019-03-17 DIAGNOSIS — M791 Myalgia, unspecified site: Secondary | ICD-10-CM | POA: Diagnosis not present

## 2019-03-17 DIAGNOSIS — G894 Chronic pain syndrome: Secondary | ICD-10-CM

## 2019-03-17 DIAGNOSIS — I701 Atherosclerosis of renal artery: Secondary | ICD-10-CM

## 2019-03-17 DIAGNOSIS — I214 Non-ST elevation (NSTEMI) myocardial infarction: Secondary | ICD-10-CM

## 2019-03-17 DIAGNOSIS — E559 Vitamin D deficiency, unspecified: Secondary | ICD-10-CM

## 2019-03-17 DIAGNOSIS — I119 Hypertensive heart disease without heart failure: Secondary | ICD-10-CM

## 2019-03-17 DIAGNOSIS — F4001 Agoraphobia with panic disorder: Secondary | ICD-10-CM

## 2019-03-17 DIAGNOSIS — Z8639 Personal history of other endocrine, nutritional and metabolic disease: Secondary | ICD-10-CM

## 2019-03-17 DIAGNOSIS — M6798 Unspecified disorder of synovium and tendon, other site: Secondary | ICD-10-CM

## 2019-03-17 DIAGNOSIS — G518 Other disorders of facial nerve: Secondary | ICD-10-CM

## 2019-03-17 DIAGNOSIS — F331 Major depressive disorder, recurrent, moderate: Secondary | ICD-10-CM

## 2019-03-17 DIAGNOSIS — G44021 Chronic cluster headache, intractable: Secondary | ICD-10-CM

## 2019-03-17 DIAGNOSIS — F17200 Nicotine dependence, unspecified, uncomplicated: Secondary | ICD-10-CM

## 2019-03-17 DIAGNOSIS — M792 Neuralgia and neuritis, unspecified: Secondary | ICD-10-CM

## 2019-03-17 DIAGNOSIS — M67951 Unspecified disorder of synovium and tendon, right thigh: Secondary | ICD-10-CM

## 2019-03-17 DIAGNOSIS — M67952 Unspecified disorder of synovium and tendon, left thigh: Secondary | ICD-10-CM

## 2019-03-17 DIAGNOSIS — Z79891 Long term (current) use of opiate analgesic: Secondary | ICD-10-CM

## 2019-03-18 ENCOUNTER — Other Ambulatory Visit: Payer: Self-pay | Admitting: *Deleted

## 2019-03-18 ENCOUNTER — Telehealth: Payer: Self-pay | Admitting: *Deleted

## 2019-03-18 ENCOUNTER — Encounter (INDEPENDENT_AMBULATORY_CARE_PROVIDER_SITE_OTHER): Payer: BLUE CROSS/BLUE SHIELD | Admitting: Family Medicine

## 2019-03-18 DIAGNOSIS — E559 Vitamin D deficiency, unspecified: Secondary | ICD-10-CM | POA: Diagnosis not present

## 2019-03-18 MED ORDER — CARVEDILOL 25 MG PO TABS: 25.0000 mg | ORAL_TABLET | Freq: Two times a day (BID) | ORAL | 5 refills | Status: DC

## 2019-03-18 MED ORDER — VITAMIN D (ERGOCALCIFEROL) 1.25 MG (50000 UNIT) PO CAPS: 50000.0000 [IU] | ORAL_CAPSULE | ORAL | 0 refills | Status: DC

## 2019-03-18 NOTE — Telephone Encounter (Signed)
-----   Message from Ofilia Neas, PA-C sent at 03/18/2019  9:21 AM EDT ----- Sed rate WNL. CK WNL. TSH WNL. Immunoglobulins WNL. Vitamin D is low-15.  Please send in vitamin D 50,000 units by mouth twice weekly for 3 months.  Recheck vitamin D in 3 months.

## 2019-03-18 NOTE — Telephone Encounter (Signed)
[facility] VISIT   Patient agreed to [facility] visit and is aware that copayment and coinsurance may apply. Patient was treated using telemedicine according to accepted telemedicine protocols.  Subjective:   Patient complains of low Vitamin D.  Patient Active Problem List   Diagnosis Date Noted  . History of carotid endarterectomy (Right) 03/04/2019  . Pain medication agreement signed 03/04/2019  . Atypical facial pain (Right) 01/27/2019  . Chronic ear pain (Right) 01/27/2019  . Chronic jaw pain (Right) 01/27/2019  . Geniculate Neuralgia (Right) 01/27/2019  . Vitamin D deficiency 01/19/2019  . Neurogenic pain 01/19/2019  . Chronic anticoagulation (PLAVIX) 01/19/2019  . Chronic pain syndrome 01/07/2019  . Long term current use of opiate analgesic 01/07/2019  . Long term prescription benzodiazepine use 01/07/2019  . Pharmacologic therapy 01/07/2019  . Disorder of skeletal system 01/07/2019  . Problems influencing health status 01/07/2019  . Chronic headaches (Primary Area of Pain) (Right) 01/07/2019  . Opiate use 09/24/2018  . Insect bite 09/08/2018  . Edema 07/01/2018  . Myalgia 06/24/2018  . Chronic ankle pain (Bilateral) 12/27/2017  . Tendinopathy of right gluteus medius 12/27/2017  . Tendinopathy of left gluteus medius 12/27/2017  . Bilateral hip pain 12/26/2017  . Other chronic pain 10/17/2017  . Elevated troponin I level 05/21/2017  . Major depressive disorder, recurrent episode, moderate ([place]) 11/19/2016  . Insomnia 10/30/2016  . Hypertension, accelerated with heart disease, without CHF   . Constipation 07/25/2016  . S/P CABG x 4 07/06/2016  . Bradycardia   . CAD (coronary artery disease)   . Carotid stenosis   . CAD in native artery 06/29/2016  . Elevated troponin 06/28/2016  . Essential hypertension, malignant 06/28/2016  . Tobacco abuse 06/28/2016  . Essential hypertension   . Malignant hypertension   . Diabetes ([place])   . Chest pain with high risk for cardiac  etiology 06/27/2016  . NSTEMI (non-ST elevated myocardial infarction) ([place]) 06/27/2016  . Proteinuria 03/15/2016  . Renal artery stenosis ([place]) 03/15/2016  . MDD (major depressive disorder) 10/17/2015  . Agoraphobia with panic attacks 04/25/2015  . HTN (hypertension), malignant 10/20/2013  . Cluster headache 03/20/2012  . HLD (hyperlipidemia) 07/26/2010  . ADJUSTMENT DISORDER WITH MIXED FEATURES 07/26/2010   Social History   Tobacco Use  . Smoking status: Current Every Day Smoker    Packs/day: 1.00    Years: 27.00    Pack years: 27.00    Types: Cigarettes  . Smokeless tobacco: Never Used  . Tobacco comment: patient smokes when she drives.   Substance Use Topics  . Alcohol use: Yes    Alcohol/week: 0.0 standard drinks    Comment: socially    Current Outpatient Medications:  .  albuterol (PROVENTIL HFA;VENTOLIN HFA) 108 (90 Base) MCG/ACT inhaler, Inhale 2 puffs into the lungs every 6 (six) hours as needed for wheezing., Disp: 1 Inhaler, Rfl: 0 .  Alirocumab (PRALUENT) 150 MG/ML SOAJ, Inject 150 mg into the skin every 14 (fourteen) days., Disp: 2 pen, Rfl: 11 .  aspirin EC 81 MG tablet, Take 1 tablet (81 mg total) by mouth daily., Disp: 90 tablet, Rfl: 3 .  benztropine (COGENTIN) 0.5 MG tablet, Take 1 tablet (0.5 mg total) by mouth daily., Disp: 90 tablet, Rfl: 0 .  calcium carbonate (CALCIUM 600) 600 MG TABS tablet, Take 1 tablet (600 mg total) by mouth 2 (two) times daily with a meal for 30 days., Disp: 60 tablet, Rfl: 0 .  carvedilol (COREG) 25 MG tablet, TAKE 1 TABLET (25 MG TOTAL)  BY MOUTH 2 (TWO) TIMES DAILY WITH A MEAL., Disp: 60 tablet, Rfl: 1 .  chlorproMAZINE (THORAZINE) 50 MG tablet, Take 1 tablet (50 mg total) by mouth at bedtime., Disp: 90 tablet, Rfl: 0 .  Cholecalciferol (VITAMIN D3) 125 MCG (5000 UT) CAPS, Take 1 capsule (5,000 Units total) by mouth daily with breakfast. Take along with calcium and magnesium., Disp: 30 capsule, Rfl: 5 .  clonazePAM (KLONOPIN) 0.5 MG  tablet, Take one tab three times a day and 4th as needed, Disp: 100 tablet, Rfl: 2 .  clopidogrel (PLAVIX) 75 MG tablet, Take 1 tablet (75 mg total) by mouth daily., Disp: 30 tablet, Rfl: 11 .  Cyanocobalamin (VITAMIN B-12) 5000 MCG SUBL, Place 1 tablet (5,000 mcg total) under the tongue daily for 30 days., Disp: 30 each, Rfl: 0 .  cyclobenzaprine (FEXMID) 7.5 MG tablet, TAKE 1 TABLET BY MOUTH 3 TIMES A DAY AS NEEDED FOR MUSCLE SPASMS, Disp: 60 tablet, Rfl: 5 .  FLUoxetine HCl 60 MG TABS, Take 60 mg by mouth daily., Disp: 90 tablet, Rfl: 0 .  gabapentin (NEURONTIN) 100 MG capsule, Take 1-3 capsules (100-300 mg total) by mouth 4 (four) times daily for 30 days. Follow written titration schedule., Disp: 360 capsule, Rfl: 0 .  Insulin Glargine (LANTUS SOLOSTAR) 100 UNIT/ML Solostar Pen, Inject 40 Units into the skin daily., Disp: 5 pen, Rfl: PRN .  insulin NPH-regular Human (NOVOLIN 70/30 RELION) (70-30) 100 UNIT/ML injection, Inject 5u SQ bid; but follow flow chart and may increase to 25u depending on flow chart up to tid, Disp: 10 mL, Rfl: 2 .  Insulin Syringe-Needle U-100 29G X 1/2" 0.3 ML MISC, 1 each by Does not apply route 2 (two) times daily., Disp: 30 each, Rfl: 0 .  isosorbide mononitrate (IMDUR) 30 MG 24 hr tablet, TAKE 1 TABLET (30 MG TOTAL) BY MOUTH DAILY. PLEASE KEEP APPOINTMENT FOR FURTHER REFILLS! THANKS :), Disp: 90 tablet, Rfl: 3 .  lamoTRIgine (LAMICTAL) 200 MG tablet, Take 1 tablet (200 mg total) by mouth daily., Disp: 90 tablet, Rfl: 0 .  losartan (COZAAR) 100 MG tablet, Take 1 tablet (100 mg total) by mouth daily., Disp: 90 tablet, Rfl: 1 .  magic mouthwash SOLN, Take 15 mLs by mouth 3 (three) times daily as needed for mouth pain., Disp: 250 mL, Rfl: 1 .  Magnesium 500 MG CAPS, Take 1 capsule (500 mg total) by mouth 2 (two) times daily at 8 am and 10 pm., Disp: 60 capsule, Rfl: 5 .  naloxone (NARCAN) nasal spray 4 mg/0.1 mL, 4 mg (contents of 1 nasal spray) as a single dose in one  nostril; may repeat every 2 to 3 minutes in alternating nostrils until medical assistance becomes available, Disp: 1 kit, Rfl: 0 .  nitroGLYCERIN (NITROSTAT) 0.4 MG SL tablet, PLACE 1 TABLET (0.4 MG TOTAL) UNDER THE TONGUE EVERY 5 (FIVE) MINUTES AS NEEDED FOR CHEST PAIN., Disp: 25 tablet, Rfl: PRN .  ondansetron (ZOFRAN) 4 MG tablet, TAKE 1 TABLET (4 MG TOTAL) BY MOUTH DAILY AS NEEDED., Disp: 10 tablet, Rfl: 2 .  oxyCODONE (OXY IR/ROXICODONE) 5 MG immediate release tablet, Take 1 tablet (5 mg total) by mouth 2 (two) times daily as needed for up to 30 days for severe pain. Must last 30 days., Disp: 60 tablet, Rfl: 0 .  prochlorperazine (COMPAZINE) 10 MG tablet, Take 1 tablet (10 mg total) by mouth every 8 (eight) hours as needed (headache)., Disp: 30 tablet, Rfl: 0 .  rosuvastatin (CRESTOR) 10 MG tablet,  Take 1 tablet (10 mg total) by mouth daily., Disp: 90 tablet, Rfl: 3 .  spironolactone (ALDACTONE) 25 MG tablet, Take 1 tablet (25 mg total) by mouth daily., Disp: 30 tablet, Rfl: 5 .  [START ON 03/19/2019] Vitamin D, Ergocalciferol, (DRISDOL) 1.25 MG (50000 UT) CAPS capsule, Take 1 capsule (50,000 Units total) by mouth 2 (two) times a week., Disp: 24 capsule, Rfl: 0  No Known Allergies  Assessment and Plan:   Diagnosis: Low vitamin D, pain. Please see myChart communication and orders below.   No orders of the defined types were placed in this encounter.  No orders of the defined types were placed in this encounter.   Arnette Norris, MD 03/18/2019  A total of 5 minutes were spent by me to personally review the patient-generated inquiry, review patient records and data pertinent to assessment of the patient's problem, develop a management plan including generation of prescriptions and/or orders, and on subsequent communication with the patient through secure the MyChart portal service.   There is no separately reported E/M service related to this service in the past 7 days nor does the patient have  an upcoming soonest available appointment for this issue. This work was completed in less than 7 days.   The patient consented to this service today (see patient agreement prior to ongoing communication). Patient counseled regarding the need for in-person exam for certain conditions and was advised to call the office if any changing or worsening symptoms occur.   The codes to be used for the E/M service are: '[x]'$   99421 for 5-10 minutes of time spent on the inquiry. '[]'$   99422 for 11-20 minutes. '[]'$   [id number] for 21+ minutes.

## 2019-03-23 LAB — IGG, IGA, IGM
IgG (Immunoglobin G), Serum: 972 mg/dL (ref 600–1640)
IgM, Serum: 55 mg/dL (ref 50–300)
Immunoglobulin A: 230 mg/dL (ref 47–310)

## 2019-03-23 LAB — PROTEIN ELECTROPHORESIS, SERUM, WITH REFLEX
Albumin ELP: 4.3 g/dL (ref 3.8–4.8)
Alpha 1: 0.3 g/dL (ref 0.2–0.3)
Alpha 2: 0.9 g/dL (ref 0.5–0.9)
Beta 2: 0.5 g/dL (ref 0.2–0.5)
Beta Globulin: 0.5 g/dL (ref 0.4–0.6)
Gamma Globulin: 0.8 g/dL (ref 0.8–1.7)
Total Protein: 7.3 g/dL (ref 6.1–8.1)

## 2019-03-23 LAB — TSH: TSH: 0.89 mIU/L

## 2019-03-23 LAB — IFE INTERPRETATION: Immunofix Electr Int: NOT DETECTED

## 2019-03-23 LAB — VITAMIN D 25 HYDROXY (VIT D DEFICIENCY, FRACTURES): Vit D, 25-Hydroxy: 15 ng/mL — ABNORMAL LOW (ref 30–100)

## 2019-03-23 LAB — SEDIMENTATION RATE: Sed Rate: 17 mm/h (ref 0–20)

## 2019-03-23 LAB — CK: Total CK: 32 U/L (ref 29–143)

## 2019-03-23 NOTE — Progress Notes (Signed)
Pt was referred due to high ESR. Repeat ESR is normal.

## 2019-03-30 ENCOUNTER — Ambulatory Visit (INDEPENDENT_AMBULATORY_CARE_PROVIDER_SITE_OTHER): Payer: BLUE CROSS/BLUE SHIELD | Admitting: Family Medicine

## 2019-03-30 VITALS — BP 171/84 | HR 82

## 2019-03-30 DIAGNOSIS — I119 Hypertensive heart disease without heart failure: Secondary | ICD-10-CM | POA: Diagnosis not present

## 2019-03-30 DIAGNOSIS — G894 Chronic pain syndrome: Secondary | ICD-10-CM | POA: Diagnosis not present

## 2019-03-30 DIAGNOSIS — G8929 Other chronic pain: Secondary | ICD-10-CM | POA: Diagnosis not present

## 2019-03-30 MED ORDER — INSULIN NPH ISOPHANE & REGULAR (70-30) 100 UNIT/ML ~~LOC~~ SUSP: SUBCUTANEOUS | 99 refills | Status: DC

## 2019-03-30 MED ORDER — CYCLOBENZAPRINE HCL 7.5 MG PO TABS: ORAL_TABLET | ORAL | 5 refills | Status: DC

## 2019-03-30 NOTE — Assessment & Plan Note (Signed)
High but better now that that her pain is under better control. >25 minutes spent in face to face time with patient, >50% spent in counselling or coordination of care discussing HTN and chronic pain.

## 2019-03-30 NOTE — Assessment & Plan Note (Signed)
Followed by Dr. Estrella Deeds but since injections not working, she may ask me to take over prescribing rxs. She will let me know after she talks to him on Wednesday. Gabapentin 300 mg four times daily and oxycodone are helping.

## 2019-03-30 NOTE — Progress Notes (Signed)
Virtual Visit via Video   I connected with Erika [name] on 03/30/19 at  9:20 AM EDT by a video enabled telemedicine application and verified that I am speaking with the correct person using two identifiers. Location patient: Home Location provider:  [facility], Office Persons participating in the virtual visit: Teresina M Ballew, Pearly Bartosik, MD   I discussed the limitations of evaluation and management by telemedicine and the availability of in person appointments. The patient expressed understanding and agreed to proceed.  Subjective:   HPI: follow up.  Pain control- saw Dr. Dossie Arbour on 03/05/19.  Note reviewed. She feels injections help but only for short period of time.  He does have her on oxycodone now.  Pain is now 6-7 when it was a 10 when I last saw her on 03/02/19.   Has not taken her BP medication this morning.  It is high today but better than it was when I last aw her.  Usually worse when she is in pain.  BP Readings from Last 3 Encounters:  03/30/19 (!) 171/84  03/17/19 (!) 160/108  03/05/19 130/80    ROS: See pertinent positives and negatives per HPI.  Review of Systems  Constitutional: Negative.   HENT: Negative.   Eyes: Negative.   Respiratory: Negative.   Cardiovascular: Negative.   Gastrointestinal: Negative.   Musculoskeletal: Positive for arthralgias.  Skin: Negative.   Allergic/Immunologic: Negative.   Neurological: Negative.   Hematological: Negative.   Psychiatric/Behavioral: Negative.   All other systems reviewed and are negative.   Patient Active Problem List   Diagnosis Date Noted  . History of carotid endarterectomy (Right) 03/04/2019  . Pain medication agreement signed 03/04/2019  . Atypical facial pain (Right) 01/27/2019  . Chronic ear pain (Right) 01/27/2019  . Chronic jaw pain (Right) 01/27/2019  . Geniculate Neuralgia (Right) 01/27/2019  . Vitamin D deficiency 01/19/2019  . Neurogenic pain 01/19/2019  . Chronic  anticoagulation (PLAVIX) 01/19/2019  . Chronic pain syndrome 01/07/2019  . Long term current use of opiate analgesic 01/07/2019  . Long term prescription benzodiazepine use 01/07/2019  . Pharmacologic therapy 01/07/2019  . Disorder of skeletal system 01/07/2019  . Problems influencing health status 01/07/2019  . Chronic headaches (Primary Area of Pain) (Right) 01/07/2019  . Opiate use 09/24/2018  . Insect bite 09/08/2018  . Edema 07/01/2018  . Myalgia 06/24/2018  . Chronic ankle pain (Bilateral) 12/27/2017  . Tendinopathy of right gluteus medius 12/27/2017  . Tendinopathy of left gluteus medius 12/27/2017  . Bilateral hip pain 12/26/2017  . Other chronic pain 10/17/2017  . Elevated troponin I level 05/21/2017  . Major depressive disorder, recurrent episode, moderate ([place]) 11/19/2016  . Insomnia 10/30/2016  . Hypertension, accelerated with heart disease, without CHF   . Constipation 07/25/2016  . S/P CABG x 4 07/06/2016  . Bradycardia   . CAD (coronary artery disease)   . Carotid stenosis   . CAD in native artery 06/29/2016  . Elevated troponin 06/28/2016  . Essential hypertension, malignant 06/28/2016  . Tobacco abuse 06/28/2016  . Essential hypertension   . Malignant hypertension   . Diabetes ([place])   . Chest pain with high risk for cardiac etiology 06/27/2016  . NSTEMI (non-ST elevated myocardial infarction) ([place]) 06/27/2016  . Proteinuria 03/15/2016  . Renal artery stenosis ([place]) 03/15/2016  . MDD (major depressive disorder) 10/17/2015  . Agoraphobia with panic attacks 04/25/2015  . HTN (hypertension), malignant 10/20/2013  . Cluster headache 03/20/2012  . HLD (hyperlipidemia) 07/26/2010  . ADJUSTMENT  DISORDER WITH MIXED FEATURES 07/26/2010    Social History   Tobacco Use  . Smoking status: Current Every Day Smoker    Packs/day: 1.00    Years: 27.00    Pack years: 27.00    Types: Cigarettes  . Smokeless tobacco: Never Used  . Tobacco comment: patient smokes when  she drives.   Substance Use Topics  . Alcohol use: Yes    Alcohol/week: 0.0 standard drinks    Comment: socially    Current Outpatient Medications:  .  albuterol (PROVENTIL HFA;VENTOLIN HFA) 108 (90 Base) MCG/ACT inhaler, Inhale 2 puffs into the lungs every 6 (six) hours as needed for wheezing., Disp: 1 Inhaler, Rfl: 0 .  aspirin EC 81 MG tablet, Take 1 tablet (81 mg total) by mouth daily., Disp: 90 tablet, Rfl: 3 .  benztropine (COGENTIN) 0.5 MG tablet, Take 1 tablet (0.5 mg total) by mouth daily., Disp: 90 tablet, Rfl: 0 .  calcium carbonate (CALCIUM 600) 600 MG TABS tablet, Take 1 tablet (600 mg total) by mouth 2 (two) times daily with a meal for 30 days., Disp: 60 tablet, Rfl: 0 .  carvedilol (COREG) 25 MG tablet, Take 1 tablet (25 mg total) by mouth 2 (two) times daily with a meal., Disp: 60 tablet, Rfl: 5 .  chlorproMAZINE (THORAZINE) 50 MG tablet, Take 1 tablet (50 mg total) by mouth at bedtime., Disp: 90 tablet, Rfl: 0 .  Cholecalciferol (VITAMIN D3) 125 MCG (5000 UT) CAPS, Take 1 capsule (5,000 Units total) by mouth daily with breakfast. Take along with calcium and magnesium., Disp: 30 capsule, Rfl: 5 .  clonazePAM (KLONOPIN) 0.5 MG tablet, Take one tab three times a day and 4th as needed, Disp: 100 tablet, Rfl: 2 .  clopidogrel (PLAVIX) 75 MG tablet, Take 1 tablet (75 mg total) by mouth daily., Disp: 30 tablet, Rfl: 11 .  Cyanocobalamin (VITAMIN B-12) 5000 MCG SUBL, Place 1 tablet (5,000 mcg total) under the tongue daily for 30 days., Disp: 30 each, Rfl: 0 .  cyclobenzaprine (FEXMID) 7.5 MG tablet, TAKE 1 TABLET BY MOUTH 3 TIMES A DAY AS NEEDED FOR MUSCLE SPASMS, Disp: 90 tablet, Rfl: 5 .  FLUoxetine HCl 60 MG TABS, Take 60 mg by mouth daily., Disp: 90 tablet, Rfl: 0 .  gabapentin (NEURONTIN) 300 MG capsule, Take 300 mg by mouth 4 (four) times daily., Disp: , Rfl:  .  Insulin Glargine (LANTUS SOLOSTAR) 100 UNIT/ML Solostar Pen, Inject 40 Units into the skin daily., Disp: 5 pen, Rfl:  PRN .  insulin NPH-regular Human (NOVOLIN 70/30) (70-30) 100 UNIT/ML injection, UAD; 5u bid; may used up to 25u prn according to flow chart, Disp: 10 mL, Rfl: PRN .  Insulin Syringe-Needle U-100 29G X 1/2" 0.3 ML MISC, 1 each by Does not apply route 2 (two) times daily., Disp: 30 each, Rfl: 0 .  isosorbide mononitrate (IMDUR) 30 MG 24 hr tablet, TAKE 1 TABLET (30 MG TOTAL) BY MOUTH DAILY. PLEASE KEEP APPOINTMENT FOR FURTHER REFILLS! THANKS :) (Patient taking differently: Take 30 mg by mouth daily. ), Disp: 90 tablet, Rfl: 3 .  lamoTRIgine (LAMICTAL) 200 MG tablet, Take 1 tablet (200 mg total) by mouth daily., Disp: 90 tablet, Rfl: 0 .  losartan (COZAAR) 100 MG tablet, Take 1 tablet (100 mg total) by mouth daily., Disp: 90 tablet, Rfl: 1 .  magic mouthwash SOLN, Take 15 mLs by mouth 3 (three) times daily as needed for mouth pain., Disp: 250 mL, Rfl: 1 .  Magnesium 500 MG  CAPS, Take 1 capsule (500 mg total) by mouth 2 (two) times daily at 8 am and 10 pm., Disp: 60 capsule, Rfl: 5 .  naloxone (NARCAN) nasal spray 4 mg/0.1 mL, 4 mg (contents of 1 nasal spray) as a single dose in one nostril; may repeat every 2 to 3 minutes in alternating nostrils until medical assistance becomes available, Disp: 1 kit, Rfl: 0 .  nitroGLYCERIN (NITROSTAT) 0.4 MG SL tablet, PLACE 1 TABLET (0.4 MG TOTAL) UNDER THE TONGUE EVERY 5 (FIVE) MINUTES AS NEEDED FOR CHEST PAIN., Disp: 25 tablet, Rfl: PRN .  ondansetron (ZOFRAN) 4 MG tablet, TAKE 1 TABLET (4 MG TOTAL) BY MOUTH DAILY AS NEEDED., Disp: 10 tablet, Rfl: 2 .  oxyCODONE (OXY IR/ROXICODONE) 5 MG immediate release tablet, Take 1 tablet (5 mg total) by mouth 2 (two) times daily as needed for up to 30 days for severe pain. Must last 30 days., Disp: 60 tablet, Rfl: 0 .  prochlorperazine (COMPAZINE) 10 MG tablet, Take 1 tablet (10 mg total) by mouth every 8 (eight) hours as needed (headache)., Disp: 30 tablet, Rfl: 0 .  rosuvastatin (CRESTOR) 10 MG tablet, Take 1 tablet (10 mg  total) by mouth daily., Disp: 90 tablet, Rfl: 3 .  spironolactone (ALDACTONE) 25 MG tablet, Take 1 tablet (25 mg total) by mouth daily., Disp: 30 tablet, Rfl: 5 .  Vitamin D, Ergocalciferol, (DRISDOL) 1.25 MG (50000 UT) CAPS capsule, Take 1 capsule (50,000 Units total) by mouth 2 (two) times a week., Disp: 24 capsule, Rfl: 0 .  Alirocumab (PRALUENT) 150 MG/ML SOAJ, Inject 150 mg into the skin every 14 (fourteen) days. (Patient not taking: Reported on 03/30/2019), Disp: 2 pen, Rfl: 11  No Known Allergies  Objective:  BP (!) 171/84 (BP Location: Left Arm, Patient Position: Sitting, Cuff Size: Normal)   Pulse 82   LMP 03/02/2019   SpO2 98%   VITALS: Per patient if applicable, see vitals. GENERAL: Alert, appears well and in no acute distress. HEENT: Atraumatic, conjunctiva clear, no obvious abnormalities on inspection of external nose and ears. NECK: Normal movements of the head and neck. CARDIOPULMONARY: No increased WOB. Speaking in clear sentences. I:E ratio WNL.  MS: Moves all visible extremities without noticeable abnormality. PSYCH: Pleasant and cooperative, well-groomed. Speech normal rate and rhythm. Affect is appropriate. Insight and judgement are appropriate. Attention is focused, linear, and appropriate.  NEURO: CN grossly intact. Oriented as arrived to appointment on time with no prompting. Moves both UE equally.  SKIN: No obvious lesions, wounds, erythema, or cyanosis noted on face or hands.  Assessment and Plan:   Erika [name] was seen today for follow-up.  Diagnoses and all orders for this visit:  Chronic pain syndrome  Hypertension, accelerated with heart disease, without CHF  Other orders -     insulin NPH-regular Human (NOVOLIN 70/30) (70-30) 100 UNIT/ML injection; UAD; 5u bid; may used up to 25u prn according to flow chart -     cyclobenzaprine (FEXMID) 7.5 MG tablet; TAKE 1 TABLET BY MOUTH 3 TIMES A DAY AS NEEDED FOR MUSCLE SPASMS    . Reviewed expectations re: course  of current medical issues. . Discussed self-management of symptoms. . Outlined signs and symptoms indicating need for more acute intervention. . Patient verbalized understanding and all questions were answered. Mar[facility] Health Maintenance issues including appropriate healthy diet, exercise, and smoking avoidance were discussed with patient. . See orders for this visit as documented in the electronic medical record.  Arnette Norris, MD 03/30/2019

## 2019-03-31 NOTE — Progress Notes (Signed)
Virtual Visit via Telephone Note  I connected with Erika [name] on 03/31/19 at  8:15 AM EDT by telephone and verified that I am speaking with the correct person using two identifiers.   I discussed the limitations, risks, security and privacy concerns of performing an evaluation and management service by telephone and the availability of in person appointments. I also discussed with the patient that there may be a patient responsible charge related to this service. The patient expressed understanding and agreed to proceed.  CC: Myalgias   History of Present Illness: Patient is a 48 year old female with a past medical history of myalgias and chronic pain syndrome. She started taking vitamin D 50,000 units by mouth twice weekly and 5,000 units by mouth daily for 1 month. Her vitamin D was 15 on 03/17/19.   She continues to see a chiropractor for bilateral gluteus medius pain, which provides relief for several days.  She feels most of the myalgias and discomfort in her lower extremities are due to PAD.  She states she is unable to walk to the mailbox (200 ft) due to claudication pain.  She denies any joint pain or joint swelling.  She has morning stiffness for about 15 minutes.  She has no other new concerns at this time.   Review of Systems  Constitutional: Positive for malaise/fatigue. Negative for fever.  HENT:       +mouth dryness  Eyes: Negative for photophobia, pain, discharge and redness.       +eye dryness  Respiratory: Negative for cough, shortness of breath and wheezing.   Cardiovascular: Negative for chest pain and palpitations.  Gastrointestinal: Negative for blood in stool, constipation and diarrhea.  Genitourinary: Negative for dysuria.  Musculoskeletal: Positive for joint pain and myalgias. Negative for back pain and neck pain.       +Morning stiffness   Skin: Negative for rash.  Neurological: Negative for dizziness and headaches.  Psychiatric/Behavioral: Negative for  depression. The patient is not nervous/anxious and does not have insomnia.     Observations/Objective: Physical Exam  Constitutional: She is oriented to person, place, and time.  Neurological: She is alert and oriented to person, place, and time.  Psychiatric: Mood, memory, affect and judgment normal.   Patient reports morning stiffness for 15 minutes.   Patient reports nocturnal pain.  Difficulty dressing/grooming: Denies Difficulty climbing stairs: Reports Difficulty getting out of chair: Reports Difficulty using hands for taps, buttons, cutlery, and/or writing: Denies    Findings:  March 17, 2019 IFE negative, immunoglobulins normal, CK 32, TSH normal, ESR 17, vitamin D 15 01/07/19: Sed rate 73, CRP 15  01/19/19: ANA negative, RF<10   Assessment and Plan: Myalgia: March 17, 2019 IFE negative, immunoglobulins normal, CK 32, TSH normal, ESR 17, vitamin D 15: She continues to have lower extremity myalgias and gluteus medius tendinopathy.  She is seeing a chiropractor on a regular basis as well as a pain management specialist.   She declined a referral to PT.  We will mail exercises that she can perform at home. Her symptomatology is most likely due to peripheral arterial disease.  She experiences symptoms of intermittent claudication in bilateral lower extremities.  She is unable to walk more than 200 ft without exercising cramping and pain in both LEs.  She is followed by her cardiologist.  She has had several stents placed and has a doppler study scheduled.   Vitamin D deficiency: She is taking vitamin D 50,000 units by mouth twice weekly  and vitamin D 5,000 units by mouth daily for 3 months.  We will recheck labs in 3 months.   Chronic pain syndrome: She is followed by a pain management specialist. She is taking Gabapentin and oxycodone as prescribed.  Other medical problems are listed as follows:  Long term prescription benzodiazepine use  Long term current use of opiate  analgesic  Tendinopathy of right gluteus medius-chronic pain  Tendinopathy of left gluteus medius-chronic pain  Geniculate Neuralgia (Right)  Intractable chronic cluster headache  Hypertension, accelerated with heart disease, without CHF  Stenosis of carotid artery, unspecified laterality  NSTEMI (non-ST elevated myocardial infarction) ([place])  Renal artery stenosis (HCC)  Major depressive disorder, recurrent episode, moderate (HCC)  Vitamin D deficiency  Neurogenic pain  Chronic anticoagulation (PLAVIX)  Agoraphobia with panic attacks  History of hyperlipidemia  Smoker -smoking cessation discussed.  Follow Up Instructions: She follow up PRN.  We will mail hip exercises.     I discussed the assessment and treatment plan with the patient. The patient was provided an opportunity to ask questions and all were answered. The patient agreed with the plan and demonstrated an understanding of the instructions.   The patient was advised to call back or seek an in-person evaluation if the symptoms worsen or if the condition fails to improve as anticipated.  I provided 25 minutes of non-face-to-face time during this encounter.  Bo Merino, MD   Scribed by- Ofilia Neas, PA-C

## 2019-03-31 NOTE — Progress Notes (Signed)
March 17, 2019 IFE negative, immunoglobulins normal, CK 32, TSH normal, ESR 17, vitamin D 15

## 2019-04-01 ENCOUNTER — Encounter: Payer: Self-pay | Admitting: Family Medicine

## 2019-04-01 ENCOUNTER — Other Ambulatory Visit: Payer: Self-pay

## 2019-04-01 ENCOUNTER — Telehealth: Payer: Self-pay

## 2019-04-01 ENCOUNTER — Ambulatory Visit: Payer: BLUE CROSS/BLUE SHIELD | Attending: Pain Medicine | Admitting: Pain Medicine

## 2019-04-01 DIAGNOSIS — M792 Neuralgia and neuritis, unspecified: Secondary | ICD-10-CM

## 2019-04-01 DIAGNOSIS — R51 Headache: Secondary | ICD-10-CM

## 2019-04-01 DIAGNOSIS — G894 Chronic pain syndrome: Secondary | ICD-10-CM

## 2019-04-01 DIAGNOSIS — Z9889 Other specified postprocedural states: Secondary | ICD-10-CM

## 2019-04-01 DIAGNOSIS — G501 Atypical facial pain: Secondary | ICD-10-CM

## 2019-04-01 DIAGNOSIS — B0221 Postherpetic geniculate ganglionitis: Secondary | ICD-10-CM | POA: Diagnosis not present

## 2019-04-01 DIAGNOSIS — G8929 Other chronic pain: Secondary | ICD-10-CM

## 2019-04-01 DIAGNOSIS — R519 Headache, unspecified: Secondary | ICD-10-CM

## 2019-04-01 DIAGNOSIS — H9201 Otalgia, right ear: Secondary | ICD-10-CM

## 2019-04-01 DIAGNOSIS — R6884 Jaw pain: Secondary | ICD-10-CM

## 2019-04-01 DIAGNOSIS — G518 Other disorders of facial nerve: Secondary | ICD-10-CM

## 2019-04-01 MED ORDER — GABAPENTIN 300 MG PO CAPS: 300.0000 mg | ORAL_CAPSULE | Freq: Four times a day (QID) | ORAL | 2 refills | Status: DC

## 2019-04-01 MED ORDER — OXYCODONE HCL 5 MG PO TABS: 5.0000 mg | ORAL_TABLET | Freq: Two times a day (BID) | ORAL | 0 refills | Status: DC | PRN

## 2019-04-01 NOTE — Telephone Encounter (Signed)
Clarified with Dr Dossie Arbour and called patient back.  She states she misunderstood.  Instructed her to take 3oo mg qid and to follow the titration schedule to increase up to 9oo mg qid if tolerated.  Patient states understanding.  Informed patient to call us back for any further questions or concerns.

## 2019-04-01 NOTE — Patient Instructions (Signed)
____________________________________________________________________________________________  Gabapentin Titration  Medication used: Gabapentin (Generic Name) or Neurontin (Brand Name) 300 mg tablets/capsules  Reasons to stop increasing the dose:  Reason 1: You get good relief of symptoms, in which case there is no need to increase the daily dose any further.    Reason 2: You develop some side effects, such as sleeping all of the time, difficulty concentrating, or becoming disoriented, in which case you need to go down on the dose, to the prior level, where you were not experiencing any side effects. Stay on that dose longer, to allow more time for your body to get use it, before attempting to increase it again.   Reasons to stop increasing the dose: Reason 1: You get good relief of symptoms, in which case there is no need to increase the daily dose any further.  Reason 2: You develop some side effects, such as sleeping all of the time, difficulty concentrating, or becoming disoriented, in which case you need to go down on the dose, to the prior level, where you were not experiencing any side effects. Stay on that dose longer, to allow more time for your body to get use it, before attempting to increase it again.  Steps to increase medication: Step 1: Start by taking 1 (one) tablet at bedtime x 7 (seven) days.  Step 2: After 7 (seven) days of taking 1 (one) tablet at bedtime, increase it to 2 (two) tablets at bedtime. Stay on this dose x 7 (seven) days.  Step 3: After 7 (seven) days of taking 2 (two) tablets at bedtime, increase it to 3 (three) tablets at bedtime. Stay on this dose x another 7 (seven) days.  Step 4: After 7 (seven) days of taking 3 (three) tablet at bedtime, begin taking 1 (one) tablet at noon with lunch. Stay on this dose x another 7 (seven) days.  Step 5: After 7 (seven) days of taking 3 (three) tablet at bedtime, and 1 (one) tablet at noon, then begin taking 1 (one)  tablet in the afternoon with dinner. Stay on this dose x another 7 (seven) days.  Step 6: After 7 (seven) days of taking 3 (three) tablet at bedtime, 1 (one) tablet at noon, and 1 (one) tablet in the afternoon, then begin taking 1 (one) tablet in the morning with breakfast. Stay on this dose x another 7 (seven) days. At this point you should be taking the medicine 4 (four) times a day, or about every 6 (six) hours. This daily regimen of taking the medicine 4 (four) times a day, will be maintained from now on. You should not take any doses any sooner than every 6 (six) hours.  Step 7: After 7 (seven) days of taking 3 (three) tablet at bedtime, 1 (one) tablet at noon, 1 (one) tablet in the afternoon, and 1 (one) tablet in the morning, begin taking 2 (two) tablets at noon with lunch. Stay on this dose x another 7 (seven) days.   Step 8: After 7 (seven) days of taking 3 (three) tablet at bedtime, 2 (two) tablets at noon, 1 (one) tablet in the afternoon, and 1 (one) tablet in the morning, begin taking 2 (two) tablets in the afternoon with dinner. Stay on this dose x another 7 (seven) days.   Step 9: After 7 (seven) days of taking 3 (three) tablet at bedtime, 2 (two) tablets at noon, 2 (two) tablets in the afternoon, and 1 (one) tablet in the morning, begin taking 2 (two) tablets in  the morning with breakfast. Stay on this dose x another 7 (seven) days. At this point you should be taking the medicine 4 (four) times a day, or about every 6 (six) hours. This daily regimen of taking the medicine 4 (four) times a day, will be maintained from now on. You should not take any doses any sooner than every 6 (six) hours.  Step 10: After 7 (seven) days of taking 3 (three) tablet at bedtime, 2 (two) tablets at noon, 2 (two) tablets in the afternoon, and 2 (two) tablets in the morning, begin taking 3 (three) tablets at noon with lunch. Stay on this dose x another 7 (seven) days.   Step 11: After 7 (seven) days of taking 3  (three) tablet at bedtime, 3 (three) tablets at noon, 2 (two) tablets in the afternoon, and 2 (two) tablets in the morning, begin taking 3 (three) tablets in the afternoon with dinner. Stay on this dose x another 7 (seven) days.   Step 12: After 7 (seven) days of taking 3 (three) tablet at bedtime, 3 (three) tablets at noon, 3 (three) tablets in the afternoon, and 2 (two) tablet in the morning, begin taking 3 (three) tablets in the morning with breakfast. Stay on this dose x another 7 (seven) days. At this point you should be taking the medicine 4 (four) times a day, or about every 6 (six) hours. This daily regimen of taking the medicine 4 (four) times a day, will be maintained from now on.   Endpoint: Once you have reached the maximum dose you can tolerate without side-effects, contact your physician so as to evaluate the results of the regimen.   Questions: Feel free to contact us for any questions or problems at [phone] ____________________________________________________________________________________________  ____________________________________________________________________________________________  Medication Rules  Purpose: To inform patients, and their family members, of our rules and regulations.  Applies to: All patients receiving prescriptions (written or electronic).  Pharmacy of record: Pharmacy where electronic prescriptions will be sent. If written prescriptions are taken to a different pharmacy, please inform the nursing staff. The pharmacy listed in the electronic medical record should be the one where you would like electronic prescriptions to be sent.  Electronic prescriptions: In compliance with the [place] (STOP) Act of 2017 (Session Lanny Cramp [id number]), effective December 24, 2018, all controlled substances must be electronically prescribed. Calling prescriptions to the pharmacy will cease to exist.  Prescription refills:  Only during scheduled appointments. Applies to all prescriptions.  NOTE: The following applies primarily to controlled substances (Opioid* Pain Medications).   Patient's responsibilities: 1. Pain Pills: Bring all pain pills to every appointment (except for procedure appointments). 2. Pill Bottles: Bring pills in original pharmacy bottle. Always bring the newest bottle. Bring bottle, even if empty. 3. Medication refills: You are responsible for knowing and keeping track of what medications you take and those you need refilled. The day before your appointment: write a list of all prescriptions that need to be refilled. The day of the appointment: give the list to the admitting nurse. Prescriptions will be written only during appointments. No prescriptions will be written on procedure days. If you forget a medication: it will not be "Called in", "Faxed", or "electronically sent". You will need to get another appointment to get these prescribed. No early refills. Do not call asking to have your prescription filled early. 4. Prescription Accuracy: You are responsible for carefully inspecting your prescriptions before leaving our office. Have the discharge nurse carefully  go over each prescription with you, before taking them home. Make sure that your name is accurately spelled, that your address is correct. Check the name and dose of your medication to make sure it is accurate. Check the number of pills, and the written instructions to make sure they are clear and accurate. Make sure that you are given enough medication to last until your next medication refill appointment. 5. Taking Medication: Take medication as prescribed. When it comes to controlled substances, taking less pills or less frequently than prescribed is permitted and encouraged. Never take more pills than instructed. Never take medication more frequently than prescribed.  6. Inform other Doctors: Always inform, all of your healthcare  providers, of all the medications you take. 7. Pain Medication from other Providers: You are not allowed to accept any additional pain medication from any other Doctor or Healthcare provider. There are two exceptions to this rule. (see below) In the event that you require additional pain medication, you are responsible for notifying us, as stated below. 8. Medication Agreement: You are responsible for carefully reading and following our Medication Agreement. This must be signed before receiving any prescriptions from our practice. Safely store a copy of your signed Agreement. Violations to the Agreement will result in no further prescriptions. (Additional copies of our Medication Agreement are available upon request.) 9. Laws, Rules, & Regulations: All patients are expected to follow all Federal and [employer], [employer], Rules, [employer]. Ignorance of the Laws does not constitute a valid excuse. The use of any illegal substances is prohibited. 10. Adopted CDC guidelines & recommendations: Target dosing levels will be at or below 60 MME/day. Use of benzodiazepines** is not recommended.  Exceptions: There are only two exceptions to the rule of not receiving pain medications from other Healthcare Providers. 1. Exception #1 (Emergencies): In the event of an emergency (i.e.: accident requiring emergency care), you are allowed to receive additional pain medication. However, you are responsible for: As soon as you are able, call our office [phone], at any time of the day or night, and leave a message stating your name, the date and nature of the emergency, and the name and dose of the medication prescribed. In the event that your call is answered by a member of our staff, make sure to document and save the date, time, and the name of the person that took your information.  2. Exception #2 (Planned Surgery): In the event that you are scheduled by another doctor or dentist to have any type of surgery or  procedure, you are allowed (for a period no longer than 30 days), to receive additional pain medication, for the acute post-op pain. However, in this case, you are responsible for picking up a copy of our "Post-op Pain Management for Surgeons" handout, and giving it to your surgeon or dentist. This document is available at our office, and does not require an appointment to obtain it. Simply go to our office during business hours (Monday-Thursday from 8:00 AM to 4:00 PM) (Friday 8:00 AM to 12:00 Noon) or if you have a scheduled appointment with [name], prior to your surgery, and ask for it by name. In addition, you will need to provide [name] with your name, name of your surgeon, type of surgery, and date of procedure or surgery.  *Opioid medications include: morphine, codeine, oxycodone, oxymorphone, hydrocodone, hydromorphone, meperidine, tramadol, tapentadol, buprenorphine, fentanyl, methadone. **Benzodiazepine medications include: diazepam (Valium), alprazolam (Xanax), clonazepam (Klonopine), lorazepam (Ativan), clorazepate (Tranxene), chlordiazepoxide (Librium), estazolam (  Prosom), oxazepam (Serax), temazepam (Restoril), triazolam (Halcion) (Last updated: 02/20/2018) ____________________________________________________________________________________________   ____________________________________________________________________________________________  Medication Recommendations and Reminders  Applies to: All patients receiving prescriptions (written and/or electronic).  Medication Rules & Regulations: These rules and regulations exist for your safety and that of others. They are not flexible and neither are we. Dismissing or ignoring them will be considered "non-compliance" with medication therapy, resulting in complete and irreversible termination of such therapy. (See document titled "Medication Rules" for more details.) In all conscience, because of safety reasons, we cannot continue providing a  therapy where the patient does not follow instructions.  Pharmacy of record:   Definition: This is the pharmacy where your electronic prescriptions will be sent.   We do not endorse any particular pharmacy.  You are not restricted in your choice of pharmacy.  The pharmacy listed in the electronic medical record should be the one where you want electronic prescriptions to be sent.  If you choose to change pharmacy, simply notify our nursing staff of your choice of new pharmacy.  Recommendations:  Keep all of your pain medications in a safe place, under lock and key, even if you live alone.   After you fill your prescription, take 1 week's worth of pills and put them away in a safe place. You should keep a separate, properly labeled bottle for this purpose. The remainder should be kept in the original bottle. Use this as your primary supply, until it runs out. Once it's gone, then you know that you have 1 week's worth of medicine, and it is time to come in for a prescription refill. If you do this correctly, it is unlikely that you will ever run out of medicine.  To make sure that the above recommendation works, it is very important that you make sure your medication refill appointments are scheduled at least 1 week before you run out of medicine. To do this in an effective manner, make sure that you do not leave the office without scheduling your next medication management appointment. Always ask the nursing staff to show you in your prescription , when your medication will be running out. Then arrange for the receptionist to get you a return appointment, at least 7 days before you run out of medicine. Do not wait until you have 1 or 2 pills left, to come in. This is very poor planning and does not take into consideration that we may need to cancel appointments due to bad weather, sickness, or emergencies affecting our staff.  "Partial Fill": If for any reason your pharmacy does not have enough  pills/tablets to completely fill or refill your prescription, do not allow for a "partial fill". You will need a separate prescription to fill the remaining amount, which we will not provide. If the reason for the partial fill is your insurance, you will need to talk to the pharmacist about payment alternatives for the remaining tablets, but again, do not accept a partial fill.  Prescription refills and/or changes in medication(s):   Prescription refills, and/or changes in dose or medication, will be conducted only during scheduled medication management appointments. (Applies to both, written and electronic prescriptions.)  No refills on procedure days. No medication will be changed or started on procedure days. No changes, adjustments, and/or refills will be conducted on a procedure day. Doing so will interfere with the diagnostic portion of the procedure.  No phone refills. No medications will be "called into the pharmacy".  No Fax refills.  No weekend  refills.  No Holliday refills.  No after hours refills.  Remember:  Business hours are:  Monday to Thursday 8:00 AM to 4:00 PM Provider's Schedule: Dionisio David, NP - Appointments are:  Medication management: Monday to Thursday 8:00 AM to 4:00 PM Milinda Pointer, MD - Appointments are:  Medication management: Monday and Wednesday 8:00 AM to 4:00 PM Procedure day: Tuesday and Thursday 7:30 AM to 4:00 PM Gillis Santa, MD - Appointments are:  Medication management: Tuesday and Thursday 8:00 AM to 4:00 PM Procedure day: Monday and Wednesday 7:30 AM to 4:00 PM (Last update: 02/20/2018) ____________________________________________________________________________________________   ____________________________________________________________________________________________  CANNABIDIOL (AKA: CBD Oil or Pills)  Applies to: All patients receiving prescriptions of controlled substances (written and/or electronic).  General Information:  Cannabidiol (CBD) was discovered in 56. It is one of some 113 identified cannabinoids in cannabis (Marijuana) plants, accounting for up to 40% of the plant's extract. As of 2018, preliminary clinical research on cannabidiol included studies of anxiety, cognition, movement disorders, and pain.  Cannabidiol is consummed in multiple ways, including inhalation of cannabis smoke or vapor, as an aerosol spray into the cheek, and by mouth. It may be supplied as CBD oil containing CBD as the active ingredient (no added tetrahydrocannabinol (THC) or terpenes), a full-plant CBD-dominant hemp extract oil, capsules, dried cannabis, or as a liquid solution. CBD is thought not have the same psychoactivity as THC, and may affect the actions of THC. Studies suggest that CBD may interact with different biological targets, including cannabinoid receptors and other neurotransmitter receptors. As of 2018 the mechanism of action for its biological effects has not been determined.  In the [place], cannabidiol has a limited approval by the Food and Drug Administration (FDA) for treatment of only two types of epilepsy disorders. The side effects of long-term use of the drug include somnolence, decreased appetite, diarrhea, fatigue, malaise, weakness, sleeping problems, and others.  CBD remains a Schedule I drug prohibited for any use.  Legality: Some manufacturers ship CBD products nationally, an illegal action which the FDA has not enforced in 2018, with CBD remaining the subject of an FDA investigational new drug evaluation, and is not considered legal as a dietary supplement or food ingredient as of December 2018. Federal illegality has made it difficult historically to conduct research on CBD. CBD is openly sold in head shops and health food stores in some states where such sales have not been explicitly legalized.  Warning: Because it is not FDA approved for general use or treatment of pain, it is not required to  undergo the same manufacturing controls as prescription drugs.  This means that the available cannabidiol (CBD) may be contaminated with THC.  If this is the case, it will trigger a positive urine drug screen (UDS) test for cannabinoids (Marijuana).  Because a positive UDS for illicit substances is a violation of our medication agreement, your opioid analgesics (pain medicine) may be permanently discontinued. (Last update: 03/13/2018) ____________________________________________________________________________________________

## 2019-04-01 NOTE — Progress Notes (Signed)
Pain Management Encounter Note - Virtual Visit via Telephone Telehealth (real-time audio visits between healthcare provider and patient).  Patient's Phone No. & Preferred Pharmacy:  [phone] (home); [phone] (mobile); (Preferred) [phone]  [id number] [name], [facility] [place] [address] [place] [address] Phone: [phone] Fax: [phone]   Pre-screening note:  Our staff contacted Ms. Pazos and offered her an "in person", "face-to-face" appointment versus a telephone encounter. She indicated preferring the telephone encounter, at this time.  Reason for Virtual Visit: COVID-19*  Social distancing based on CDC and AMA recommendations.   I contacted NDEA KILROY on 04/01/2019 at 10:18 AM by telephone and clearly identified myself as Gaspar Cola, MD. I verified that I was speaking with the correct person using two identifiers (Name and date of birth: [birth year]).  Advanced Informed Consent I sought verbal advanced consent from Andrez Grime for telemedicine interactions and virtual visit. I informed Ms. Fullen of the security and privacy concerns, risks, and limitations associated with performing an evaluation and management service by telephone. I also informed Ms. Yim of the availability of "in person" appointments and I informed her of the possibility of a patient responsible charge related to this service. Ms. Nightengale expressed understanding and agreed to proceed.   Historic Elements   Ms. RAINY ROTHMAN is a 48 y.o. year old, female patient evaluated today after her last encounter by our practice on 03/10/2019. Ms. Catalina  has a past medical history of Arthralgia of temporomandibular joint, CAD, multiple vessel, Carotid arterial disease ([place]), Depression, Diastolic dysfunction, Fatty liver disease, nonalcoholic (7622), HLD (hyperlipidemia), Labile hypertension, Obesity, PTSD (post-traumatic stress disorder),  Tobacco abuse, and Type 2 diabetes mellitus [facility]) (January 2017). She also  has a past surgical history that includes Cholecystectomy; Tonsillectomy; Cesarean section; Cardiac catheterization ([name], 04/18/2016); Cardiac catheterization ([name], 06/29/2016); Coronary artery bypass graft ([name], 07/06/2016); TEE without cardioversion ([name], 07/06/2016); Endarterectomy (Right, 07/06/2016); Cardiac catheterization ([name], 08/29/2016); ABDOMINAL AORTOGRAM W/LOWER EXTREMITY ([name], 10/15/2018); and PERIPHERAL VASCULAR INTERVENTION (Left, 10/15/2018). Ms. Winkels has a current medication list which includes the following prescription(s): albuterol, alirocumab, aspirin ec, benztropine, calcium carbonate, carvedilol, chlorpromazine, vitamin d3, clonazepam, clopidogrel, vitamin b-12, cyclobenzaprine, fluoxetine hcl, gabapentin, insulin glargine, insulin nph-regular human, insulin syringe-needle u-100, isosorbide mononitrate, lamotrigine, losartan, magic mouthwash, magnesium, naloxone, nitroglycerin, ondansetron, oxycodone, oxycodone, oxycodone, prochlorperazine, rosuvastatin, spironolactone, and vitamin d (ergocalciferol). She  reports that she has been smoking cigarettes. She has a 27.00 pack-year smoking history. She has never used smokeless tobacco. She reports current alcohol use. She reports that she does not use drugs. Ms. Gittings has No Known Allergies.   HPI  I last saw her on 03/10/2019. She is being evaluated for both, medication management and a post-procedure assessment.  The patient indicates that she had complete relief of the pain, for the duration of the local anesthetic.  Unfortunately, she also experienced drooping of the face due to the blocking of the motor component of the facial nerve.  She indicates that because I had previously informed her of this side effect, she was not worried about it and it did go away as soon as the local anesthetic wore off.  Unfortunately, she did not obtain any long-term benefit from the  steroid indicating that what sustains this pain is not likely to be an inflammatory process but more so a mechanical one.  Because all of this started after a carotid endarterectomy surgery, it is likely that what were dealing with is a nerve entrapment within the  scar tissue.  I talked to the patient about the alternatives, but because that would have to go to the nerve prior to the area of the scar to do a radiofrequency, it is very likely that she would also get long-term facial nerve paralysis with the droopy face, which she indicates she is not willing to accept.  The other alternative would be to consider talking to a neurosurgeon to do an exploratory surgery to release the nerve.  Unfortunately, because the anatomy has changed secondary to the scar tissue, I would be very difficult for the neurosurgeon to find the nerve to release it and even if they could, there is a very high likelihood that she would develop scar from that older surgery and we would end up back on square #1.  Because of this, we have talked about the fact that this is likely to be permanent and the best that we can do for this is to treated with medications.  She understood and accepted.  Today I have also explained to the patient that it is not likely that the opioids would provide her with any significant benefit secondary to the fact that they are not good for the treatment of neuropathic pain.  She confirmed that this is the case in terms of what she has seen with the use of her oxycodone.  She does admit that the oxycodone seemed to bring the pain down to a level where she can function better and if this is the case, I would be willing to continue her on that medication.  However, I have talked to the patient about the fact that she is likely to develop physiological tolerance to the medication and when that happens she needs to do a "Drug Holiday".  I also went ahead and explained to her in detail what that meant.  She understood and  accepted.  We then went on to talk about the medications that could help with this neuropathic pain, specifically the Neurontin and Lyrica.  She is already taking Neurontin 300 mg 4 times a day.  She indicates not having any side effects to this and therefore I have decided to take over this medication and to begin titrating it up, as tolerated.  The patient indicated that she has access to "MY CHART" system and therefore I have instructed her to look at her AVS for clear instructions on how to increase that Neurontin.  We will continue increasing the Neurontin until she gets good relief or she gets any side effects.  If she reaches a plateau in terms of her benefits, and these are not not enough, then we will consider doing a trial with Lyrica.  Post-Procedure Evaluation  Procedure: Diagnostic right-sided facial (CN VII) nerve block  #3  under fluoroscopic guidance and IV sedation Pre-procedure pain level:  0/10 Post-procedure: 5/10          Sedation: Sedation provided.  Effectiveness during initial hour after procedure(Ultra-Short Term Relief): 100 %  Local anesthetic used: Long-acting (4-6 hours) Effectiveness: Defined as any analgesic benefit obtained secondary to the administration of local anesthetics. This carries significant diagnostic value as to the etiological location, or anatomical origin, of the pain. Duration of benefit is expected to coincide with the duration of the local anesthetic used.  Effectiveness during initial 4-6 hours after procedure(Short-Term Relief): 100 %  Long-term benefit: Defined as any relief past the pharmacologic duration of the local anesthetics.  Effectiveness past the initial 6 hours after procedure(Long-Term Relief): 0 %  Current benefits: Defined as benefit that persist at this time.   Analgesia:  No benefit Function: Back to baseline ROM: Back to baseline  Pharmacotherapy Assessment  Analgesic: Oxycodone IR 5 mg p.o. twice daily MME/day: 15 mg/day.    Monitoring: Pharmacotherapy: No side-effects or adverse reactions reported. [place] PMP: PDMP not reviewed this encounter.       Compliance: No problems identified. Plan: Refer to "POC".  Review of recent tests  DG C-Arm 1-60 Min-No Report Fluoroscopy was utilized by the requesting physician.  No radiographic  interpretation.    Office Visit on 03/17/2019  Component Date Value Ref Range Status  . Sed Rate 03/17/2019 17  0 - 20 mm/h Final  . Total CK 03/17/2019 32  29 - 143 U/L Final  . TSH 03/17/2019 0.89  mIU/L Final   Comment:           Reference Range .           > or = 20 Years  0.40-4.50 .                Pregnancy Ranges           First trimester    0.26-2.66           Second trimester   0.55-2.73           Third trimester    0.43-2.91   . Vit D, 25-Hydroxy 03/17/2019 15* 30 - 100 ng/mL Final   Comment: Vitamin D Status         25-OH Vitamin D: . Deficiency:                    <20 ng/mL Insufficiency:             20 - 29 ng/mL Optimal:                 > or = 30 ng/mL . For 25-OH Vitamin D testing on patients on  D2-supplementation and patients for whom quantitation  of D2 and D3 fractions is required, the QuestAssureD(TM) 25-OH VIT D, (D2,D3), LC/MS/MS is recommended: order  code [id number] (patients >56yrs). . For more information on this test, go to: http://education.questdiagnostics.com/faq/FAQ163 (This link is being provided for  informational/educational purposes only.)   . Total Protein 03/17/2019 7.3  6.1 - 8.1 g/dL Final  . Albumin ELP 03/17/2019 4.3  3.8 - 4.8 g/dL Final  . Alpha 1 03/17/2019 0.3  0.2 - 0.3 g/dL Final  . Alpha 2 03/17/2019 0.9  0.5 - 0.9 g/dL Final  . Beta Globulin 03/17/2019 0.5  0.4 - 0.6 g/dL Final  . Beta 2 03/17/2019 0.5  0.2 - 0.5 g/dL Final  . Gamma Globulin 03/17/2019 0.8  0.8 - 1.7 g/dL Final  . Abnormal Protein Band1 03/17/2019 NOTE  NONE DETEC g/dL Final  . SPE Interp. 03/17/2019    Final   Comment: . A poorly-defined band of  restricted protein mobility is detected in the gamma globulins. It is unlikely that this may represent a monoclonal protein; however, immunofixation analysis is available if clinically indicated. .   . Immunoglobulin A 03/17/2019 230  47 - 310 mg/dL Final  . IgG (Immunoglobin G), Serum 03/17/2019 972  600 - 1,640 mg/dL Final  . IgM, Serum 03/17/2019 55  50 - 300 mg/dL Final  . Immunofix Electr Int 03/17/2019 NO MONOCLONAL PROTEIN DETECTED   Final   Assessment  The primary encounter diagnosis was Chronic pain syndrome. Diagnoses of Atypical facial  pain (Right), Geniculate Neuralgia (Right), Chronic headaches (Primary Area of Pain) (Right), Chronic ear pain (Right), Chronic jaw pain (Right), History of carotid endarterectomy (Right), and Neurogenic pain were also pertinent to this visit.  Plan of Care  I have changed Susannah M. Overbay's gabapentin. I am also having her start on oxyCODONE and oxyCODONE. Additionally, I am having her maintain her Insulin Syringe-Needle U-100, aspirin EC, rosuvastatin, spironolactone, clopidogrel, isosorbide mononitrate, losartan, magic mouthwash, ondansetron, naloxone, Insulin Glargine, nitroGLYCERIN, Vitamin D3, Magnesium, calcium carbonate, Alirocumab, lamoTRIgine, FLUoxetine HCl, benztropine, clonazePAM, chlorproMAZINE, prochlorperazine, albuterol, Vitamin B-12, Vitamin D (Ergocalciferol), carvedilol, insulin NPH-regular Human, cyclobenzaprine, and oxyCODONE.  Pharmacotherapy (Medications Ordered): Meds ordered this encounter  Medications  . oxyCODONE (OXY IR/ROXICODONE) 5 MG immediate release tablet    Sig: Take 1 tablet (5 mg total) by mouth 2 (two) times daily as needed for up to 30 days for severe pain. Must last 30 days.    Dispense:  60 tablet    Refill:  0    [place] STOP ACT - Not applicable to Chronic Pain Syndrome (G89.4) diagnosis. Fill one day early if pharmacy is closed on scheduled refill date. Do not fill until: 04/03/19. To last until: 05/03/19.   Marland Kitchen gabapentin (NEURONTIN) 300 MG capsule    Sig: Take 1-3 capsules (300-900 mg total) by mouth 4 (four) times daily. Follow the written titration schedule.    Dispense:  360 capsule    Refill:  2    Do not place medication on "Automatic Refill". Fill one day early if pharmacy is closed on scheduled refill date.  Marland Kitchen oxyCODONE (OXY IR/ROXICODONE) 5 MG immediate release tablet    Sig: Take 1 tablet (5 mg total) by mouth 2 (two) times daily as needed for up to 30 days for severe pain. Must last 30 days.    Dispense:  30 tablet    Refill:  0    [place] STOP ACT - Not applicable to Chronic Pain Syndrome (G89.4) diagnosis. Fill one day early if pharmacy is closed on scheduled refill date. Do not fill until: 05/03/19. To last until: 06/02/19.  Marland Kitchen oxyCODONE (OXY IR/ROXICODONE) 5 MG immediate release tablet    Sig: Take 1 tablet (5 mg total) by mouth 2 (two) times daily as needed for up to 30 days for severe pain. Must last 30 days.    Dispense:  30 tablet    Refill:  0    [place] STOP ACT - Not applicable to Chronic Pain Syndrome (G89.4) diagnosis. Fill one day early if pharmacy is closed on scheduled refill date. Do not fill until: 06/02/19. To last until: 07/02/19.   Orders:  Orders Placed This Encounter  Procedures  . Schedule appointment    Scheduling Instructions:     Schedule the patient to have all medication management appointments and medication refills with Janice Norrie, NP   Follow-up plan:   Return in about 3 months (around 07/01/2019) for Med-Mgmt w/ NP.  No further interventional therapies.  From this point on, the patient will be having her medication managed by Dionisio David, NP our nurse practitioner.  This will be done under my supervision.   I discussed the assessment and treatment plan with the patient. The patient was provided an opportunity to ask questions and all were answered. The patient agreed with the plan and demonstrated an understanding of the instructions.  Patient advised to  call back or seek an in-person evaluation if the symptoms or condition worsens.  Total duration of non-face-to-face encounter: 25  minutes.  Note by: Gaspar Cola, MD Date: 04/01/2019; Time: 10:18 AM  Disclaimer:  * Given the special circumstances of the COVID-19 pandemic, the federal government has announced that the Office for Civil Rights (OCR) will exercise its enforcement discretion and will not impose penalties on physicians using telehealth in the event of noncompliance with regulatory requirements under the [place] and Accountability Act (HIPAA) in connection with the good faith provision of telehealth during the KZSWF-09 national public health emergency. ([place])

## 2019-04-01 NOTE — Telephone Encounter (Signed)
Patient states that Dr Dossie Arbour told her this morning that he was going to order Gabapentin 100 mg to add to her current dose of 300 mg. Do not see that this was ordered.  Will notify Dr Dossie Arbour.

## 2019-04-01 NOTE — Telephone Encounter (Signed)
The pharmacy gave her 300 mg tablets and total is 360 pills. This is incorrect. Can you call and fix this. She has already brought them home. The way it is written on the bottle its take 1-3 4 time daily.

## 2019-04-06 ENCOUNTER — Telehealth: Payer: Self-pay

## 2019-04-06 DIAGNOSIS — E785 Hyperlipidemia, unspecified: Secondary | ICD-10-CM

## 2019-04-06 DIAGNOSIS — I6523 Occlusion and stenosis of bilateral carotid arteries: Secondary | ICD-10-CM

## 2019-04-06 NOTE — Telephone Encounter (Signed)
PA started for Praluent through [place]: [id number] PA Case ID: [id number] Rx #: [id number]  Awaiting response

## 2019-04-07 ENCOUNTER — Telehealth (INDEPENDENT_AMBULATORY_CARE_PROVIDER_SITE_OTHER): Payer: BLUE CROSS/BLUE SHIELD | Admitting: Rheumatology

## 2019-04-07 ENCOUNTER — Encounter: Payer: Self-pay | Admitting: Rheumatology

## 2019-04-07 DIAGNOSIS — G518 Other disorders of facial nerve: Secondary | ICD-10-CM

## 2019-04-07 DIAGNOSIS — Z7901 Long term (current) use of anticoagulants: Secondary | ICD-10-CM

## 2019-04-07 DIAGNOSIS — M6798 Unspecified disorder of synovium and tendon, other site: Secondary | ICD-10-CM

## 2019-04-07 DIAGNOSIS — M67952 Unspecified disorder of synovium and tendon, left thigh: Secondary | ICD-10-CM

## 2019-04-07 DIAGNOSIS — Z79899 Other long term (current) drug therapy: Secondary | ICD-10-CM

## 2019-04-07 DIAGNOSIS — F17200 Nicotine dependence, unspecified, uncomplicated: Secondary | ICD-10-CM

## 2019-04-07 DIAGNOSIS — Z8639 Personal history of other endocrine, nutritional and metabolic disease: Secondary | ICD-10-CM

## 2019-04-07 DIAGNOSIS — E559 Vitamin D deficiency, unspecified: Secondary | ICD-10-CM

## 2019-04-07 DIAGNOSIS — M67951 Unspecified disorder of synovium and tendon, right thigh: Secondary | ICD-10-CM

## 2019-04-07 DIAGNOSIS — I701 Atherosclerosis of renal artery: Secondary | ICD-10-CM

## 2019-04-07 DIAGNOSIS — F331 Major depressive disorder, recurrent, moderate: Secondary | ICD-10-CM

## 2019-04-07 DIAGNOSIS — I214 Non-ST elevation (NSTEMI) myocardial infarction: Secondary | ICD-10-CM

## 2019-04-07 DIAGNOSIS — Z79891 Long term (current) use of opiate analgesic: Secondary | ICD-10-CM

## 2019-04-07 DIAGNOSIS — B0221 Postherpetic geniculate ganglionitis: Secondary | ICD-10-CM

## 2019-04-07 DIAGNOSIS — M791 Myalgia, unspecified site: Secondary | ICD-10-CM | POA: Diagnosis not present

## 2019-04-07 DIAGNOSIS — M792 Neuralgia and neuritis, unspecified: Secondary | ICD-10-CM

## 2019-04-07 DIAGNOSIS — G44021 Chronic cluster headache, intractable: Secondary | ICD-10-CM

## 2019-04-07 DIAGNOSIS — G894 Chronic pain syndrome: Secondary | ICD-10-CM

## 2019-04-07 DIAGNOSIS — I119 Hypertensive heart disease without heart failure: Secondary | ICD-10-CM

## 2019-04-07 NOTE — Addendum Note (Signed)
Addended by: Ricci Barker on: 04/07/2019 11:27 AM   Modules accepted: Orders

## 2019-04-07 NOTE — Telephone Encounter (Signed)
Fax received regarding Praluent PA.  That are requesting labs that show her LDL is lowing while on Praluent.  The last Direct LDL we have is from 09/2018 and the last lipid panel does not give me what the LDL is due to triglycerides being over 400.  In order to get this approved I think I will need an updated Direct LDL.

## 2019-04-07 NOTE — Telephone Encounter (Signed)
Direct LDL lab orders have been placed for [facility].

## 2019-04-07 NOTE — Telephone Encounter (Signed)
Yes please can you call her!

## 2019-04-07 NOTE — Patient Instructions (Signed)
Gluteus Medius Syndrome Rehab  Ask your health care provider which exercises are safe for you. Do exercises exactly as told by your health care provider and adjust them as directed. It is normal to feel mild stretching, pulling, tightness, or discomfort as you do these exercises, but you should stop right away if you feel sudden pain or your pain gets worse. Do not begin these exercises until told by your health care provider.  Stretching and range of motion exercise  This exercise warms up your muscles and joints and improves the movement and flexibility of your hip and pelvis. This exercise also helps to relieve pain and stiffness.  Exercise A: Lunge (hip flexor stretch)    1. Kneel on the floor on your left / right knee. Bend your other knee so it is directly over your ankle.  2. Keep good posture with your head over your shoulders. Tuck your tailbone underneath you. This will prevent your back from arching too much.  3. You should feel a gentle stretch in the front of your thigh or hip. If you do not feel a stretch, slowly lunge forward with your chest up.  4. Hold this position for __________ seconds.  5. Slowly return to the starting position.  Repeat __________ times. Complete this exercise __________ times a day.  Strengthening exercises  These exercises build strength and endurance in your hip and pelvis. Endurance is the ability to use your muscles for a long time, even after they get tired.  Exercise B: Bridge (hip extensors)    1. Lie on your back on a firm surface with your knees bent and your feet flat on the floor.  2. Tighten your buttocks muscles and lift your bottom off the floor until the trunk of your body is level with your thighs.  ? You should feel the muscles working in your buttocks and the back of your thighs. If this exercise is too easy, cross your arms over your chest or lift one leg while your bottom is up off the floor.  ? Do not arch your back.  3. Hold this position for __________  seconds.  4. Slowly lower your hips to the starting position.  5. Let your muscles relax completely between repetitions.  Repeat __________ times. Complete this exercise __________ times a day.  Exercise C: Straight leg raises (hip abductors)    1. Lie on your side with your left / right leg in the top position. Lie so your head, shoulder, knee, and hip line up. Bend your bottom knee to help you balance.  2. Lift your top leg up 4-6 inches (10-15 cm), keeping your toes pointed straight ahead.  3. Hold this position for __________ seconds.  4. Slowly lower your leg to the starting position and let your muscles relax completely.  Repeat __________ times. Complete this exercise __________ times a day.  Exercise D: Hip abductors and external rotators, quadruped  1. Get on your hands and knees on a firm, lightly padded surface. Your hands should be directly below your shoulders, and your knees should be directly below your hips.  2. Lift your left / right knee out to the side. Keep your knee bent. Do not twist your body.  3. Hold this position for __________ seconds.  4. Slowly lower your leg.  Repeat __________ times. Complete this exercise __________ times a day.  Exercise E: Single leg stand  1. Stand near a counter or door frame to hold onto as needed. It   is helpful to look in a mirror for this exercise so you can watch your hip.  2. Squeeze your left / right buttock muscles then lift up your other foot. Do not let your left / righthip push out to the side.  3. Hold this position for __________ seconds.  Repeat __________ times. Complete this exercise __________ times a day.  This information is not intended to replace advice given to you by your health care provider. Make sure you discuss any questions you have with your health care provider.  Document Released: 12/10/2005 Document Revised: 08/16/2016 Document Reviewed: 11/22/2015  Elsevier Interactive Patient Education  2019 Elsevier Inc.

## 2019-04-13 ENCOUNTER — Other Ambulatory Visit: Payer: Self-pay

## 2019-04-13 MED ORDER — SPIRONOLACTONE 25 MG PO TABS: 25.0000 mg | ORAL_TABLET | Freq: Every day | ORAL | 3 refills | Status: DC

## 2019-04-13 NOTE — Telephone Encounter (Signed)
Awaiting patient to get labs done so I can fax results to insurance for PA.

## 2019-04-13 NOTE — Telephone Encounter (Signed)
Call placed to the patient. She stated that she hasn't had the Praluent 150 mg/ml in 3 months. She received a letter on Saturday stating that they denied it due to needing a direct LDL. She has been advised that the orders have been placed and she will go in the morning for her fasting labs.   We are currently out of samples.

## 2019-04-14 ENCOUNTER — Telehealth: Payer: Self-pay | Admitting: *Deleted

## 2019-04-14 ENCOUNTER — Other Ambulatory Visit: Payer: Self-pay

## 2019-04-14 ENCOUNTER — Other Ambulatory Visit
Admission: RE | Admit: 2019-04-14 | Discharge: 2019-04-14 | Disposition: A | Discharge status: Home / Self Care | Payer: BLUE CROSS/BLUE SHIELD | Source: Ambulatory Visit | Attending: Cardiovascular Disease | Admitting: Cardiovascular Disease

## 2019-04-14 DIAGNOSIS — E785 Hyperlipidemia, unspecified: Secondary | ICD-10-CM | POA: Diagnosis not present

## 2019-04-14 DIAGNOSIS — E559 Vitamin D deficiency, unspecified: Secondary | ICD-10-CM | POA: Diagnosis not present

## 2019-04-14 LAB — LDL CHOLESTEROL, DIRECT: Direct LDL: 176.2 mg/dL — ABNORMAL HIGH (ref 0–99)

## 2019-04-14 NOTE — Telephone Encounter (Signed)
Patient has been made aware of the results. The Praluent 150 mg prior authorization is pending the results of the LDL. She has not been on any cholesterol medication for the past 3 months. The office is currently out of samples for her.   She currently taking her Plavix 75 mg daily.   She stated that the pain in her legs, bilaterally, has worsened. She can barely walk to her bedroom and back without being in pain, a distance of 20 feet. She denies any discoloration to her legs but she stated that when she sits her feet turn blue. She has been advised to elevate her legs as much as possible. She also stated that when she goes to bed at night the bottom of her feet will be numb.   She is scheduled for an LEA/ABI and Carotid on 5/18. She would like to come in sooner if possible.

## 2019-04-14 NOTE — Telephone Encounter (Signed)
It is very important that we get her approved for Praluent otherwise she will keep clogging. Please expedite her vascular studies.

## 2019-04-14 NOTE — Telephone Encounter (Signed)
-----   Message from Wellington Hampshire, MD sent at 04/14/2019  1:17 PM EDT ----- Inform patient that her LDL is very elevated.  She should go back on Praluent.

## 2019-04-15 ENCOUNTER — Ambulatory Visit (INDEPENDENT_AMBULATORY_CARE_PROVIDER_SITE_OTHER): Payer: BLUE CROSS/BLUE SHIELD

## 2019-04-15 ENCOUNTER — Telehealth: Payer: Self-pay | Admitting: Cardiovascular Disease

## 2019-04-15 DIAGNOSIS — I6523 Occlusion and stenosis of bilateral carotid arteries: Secondary | ICD-10-CM

## 2019-04-15 DIAGNOSIS — I739 Peripheral vascular disease, unspecified: Secondary | ICD-10-CM

## 2019-04-15 LAB — VITAMIN D 25 HYDROXY (VIT D DEFICIENCY, FRACTURES): Vit D, 25-Hydroxy: 17.2 ng/mL — ABNORMAL LOW (ref 30.0–100.0)

## 2019-04-15 NOTE — Telephone Encounter (Signed)
New PA started for Praluent. Awaiting response.   Erika [name] ([facility]uller) - [id number] Praluent 150MG /ML auto-injectors Status: PA Request Created: April 22nd, 2020 Sent: April 22nd, 2020

## 2019-04-15 NOTE — Telephone Encounter (Signed)
Pending consent via mychart

## 2019-04-16 ENCOUNTER — Telehealth: Payer: Self-pay

## 2019-04-16 MED ORDER — ALIROCUMAB 150 MG/ML ~~LOC~~ SOAJ: 150.0000 mg | SUBCUTANEOUS | 11 refills | Status: DC

## 2019-04-16 NOTE — Telephone Encounter (Signed)
Patient has been made aware that her Praluent has been approved pending reduction of her LDL in 12 weeks. Lab orders have been placed. [facility]  She has also been made aware of her results and verbalized her understanding.   ABI: Notes recorded by Wellington Hampshire, MD on 04/16/2019 at 1:22 PM EDT The stent in the left leg is working fine but there is a new blockage below that. She needs to start taking Praluent as soon as possible and keep follow-up visit scheduled with me to discuss the need for repeat angiogram.  Carotid: Progression of right carotid stenosis but still not significant to require surgery. Repeat study in 6 months.

## 2019-04-16 NOTE — Addendum Note (Signed)
Addended by: Ricci Barker on: 04/16/2019 02:11 PM   Modules accepted: Orders

## 2019-04-16 NOTE — Telephone Encounter (Signed)
Praluent Approved on April 22  Additional note to provider: The drug requested has been approved for 16 weeks. Reauthorization is approved if after at least 12 weeks therapy a reduction in LDL cholesterol level is seen. We will continue to cover the PCSK9 medication if LDL cholesterol has lowered since starting the PCSK9. An updated current LDL cholesterol level is required for any reauthorization coverage review. Please fax copy of updated LDL test results.

## 2019-04-16 NOTE — Telephone Encounter (Signed)
Praluent pen injector has been approved from 04/15/2019 to 08/05/2019 and the number of refill is 3.

## 2019-05-01 ENCOUNTER — Encounter: Payer: Self-pay | Admitting: Cardiovascular Disease

## 2019-05-01 ENCOUNTER — Telehealth (INDEPENDENT_AMBULATORY_CARE_PROVIDER_SITE_OTHER): Payer: BLUE CROSS/BLUE SHIELD | Admitting: Cardiovascular Disease

## 2019-05-01 ENCOUNTER — Other Ambulatory Visit: Payer: Self-pay

## 2019-05-01 VITALS — BP 186/118 | HR 118 | Ht 67.0 in | Wt 205.0 lb

## 2019-05-01 DIAGNOSIS — I10 Essential (primary) hypertension: Secondary | ICD-10-CM

## 2019-05-01 DIAGNOSIS — I739 Peripheral vascular disease, unspecified: Secondary | ICD-10-CM

## 2019-05-01 DIAGNOSIS — I2510 Atherosclerotic heart disease of native coronary artery without angina pectoris: Secondary | ICD-10-CM

## 2019-05-01 MED ORDER — AMLODIPINE BESYLATE 5 MG PO TABS: 5.0000 mg | ORAL_TABLET | Freq: Every day | ORAL | 6 refills | Status: DC

## 2019-05-01 NOTE — Patient Instructions (Signed)
Medication Instructions:  Start taking amlodipine 5 mg daily If you need a refill on your cardiac medications before your next appointment, please call your pharmacy.   Lab work: None If you have labs (blood work) drawn today and your tests are completely normal, you will receive your results only by: Marland Kitchen MyChart Message (if you have MyChart) OR . A paper copy in the mail If you have any lab test that is abnormal or we need to change your treatment, we will call you to review the results.  Testing/Procedures: None  Follow-Up: Follow-up with me Dr. Fletcher Anon in 2 months

## 2019-05-01 NOTE — Progress Notes (Signed)
Virtual Visit via Video Note   This visit type was conducted due to national recommendations for restrictions regarding the COVID-19 Pandemic (e.g. social distancing) in an effort to limit this patient's exposure and mitigate transmission in our community.  Due to her co-morbid illnesses, this patient is at least at moderate risk for complications without adequate follow up.  This format is felt to be most appropriate for this patient at this time.  All issues noted in this document were discussed and addressed.  A limited physical exam was performed with this format.  Please refer to the patient's chart for her consent to telehealth for [facility].   Date:  05/01/2019   ID:  Erika [name], DOB 21-Feb-1971, MRN [id number]  Patient Location: Home Provider Location: Office  PCP:  Lucille Passy, MD  Cardiologist:  [name], MD  Electrophysiologist:  None   Evaluation Performed:  Follow-Up Visit  Chief Complaint: Leg claudication  History of Present Illness:    Erika [name] is a 48 y.o. female was seen via video visit for follow-up regarding extensive cardiovascular history. She has known history of coronary artery disease with previous non-ST elevation myocardial infarction in 2017.  She was found to have three-vessel coronary artery disease at that time and underwent CABG and right carotid endarterectomy. She was rehospitalized in September, 2017 with chest pain in the setting of uncontrolled hypertension. Cardiac catheterization showed patent grafts . The LAD had diffuse disease distal to the anastomosis and was very small in caliber. Ejection fraction was normal with mildly elevated left ventricular end-diastolic pressure.   She has history of difficult to control hypertension with labile blood pressure.  No evidence of renal artery stenosis on previous angiography.  She has known history of severe mixed hyperlipidemia .  She was seen last year for bilateral leg  claudication worse on the left side.  Angiography in October 2019 showed significant proximal left SFA stenosis followed by short occlusion in the mid to distal segment and three-vessel runoff below the knee.  I performed successful drug-eluting stent placement to the mid and distal SFA and drug-coated balloon angioplasty to the proximal SFA .  On the right side, there was borderline significant mid SFA stenosis. She had resolution of left calf claudication after the procedure but over the last month, she has recurrent symptoms.  She describes bilateral leg claudication in the calf worse on the left side.  She feels extremely limited by her symptoms.  She has no rest pain or lower extremity ulceration. Unfortunately, she continues to smoke.  She finally started taking Praluent for hyperlipidemia.  No chest pain or shortness of breath She underwent recent noninvasive vascular evaluation in our office which showed a drop in ABI on the left at 0.69 with evidence of significant stenosis in the proximal left popliteal artery.  The patient does not have symptoms concerning for COVID-19 infection (fever, chills, cough, or new shortness of breath).    Past Medical History:  Diagnosis Date   Arthralgia of temporomandibular joint    CAD, multiple vessel    a. cath 06/29/16: ostLM 40%, ostLAD 40%, pLAD 95%, ost-pLCx 60%, pLCx 95%, mLCx 60%, mRCA 95%, D2 50%, LVSF nl;  b. 07/2016 CABG x 4 (LIMA->LAD, VG->Diag, VG->OM, VG->RCA); c. 08/2016 Cath: 3VD w/ 4/4 patent grafts. LAD distal to LIMA has diff dzs->Med rx.   Carotid arterial disease ([place])    a. 07/2016 s/p R CEA.   Depression    Diastolic dysfunction  a. echo 06/28/16: EF 50-55%, mild inf wall HK, GR1DD, mild MR, RV sys fxn nl, mildly dilated LA, PASP nl   Fatty liver disease, nonalcoholic 4196   HLD (hyperlipidemia)    Labile hypertension    a. prior renal ngiogram negative for RAS in 03/2016; b. catecholamines and metanephrines normal, mildly  elevated renin with normal aldosterone and normal ratio in 02/2016   Obesity    PTSD (post-traumatic stress disorder)    Tobacco abuse    a. 2018 - cut back from 2 ppd to 0.5 ppd.   Type 2 diabetes mellitus [facility]) January 2017   Past Surgical History:  Procedure Laterality Date   ABDOMINAL AORTOGRAM W/LOWER EXTREMITY [name] 10/15/2018   Procedure: ABDOMINAL AORTOGRAM W/LOWER EXTREMITY;  Surgeon: Wellington Hampshire, MD;  Location: [place] [facility];  Service: Cardiovascular;  Laterality: [name];   CARDIAC CATHETERIZATION [name] 06/29/2016   Procedure: Left Heart Cath and Coronary Angiography;  Surgeon: Minna Merritts, MD;  Location: [place] [facility];  Service: Cardiovascular;  Laterality: [name];   CARDIAC CATHETERIZATION [name] 08/29/2016   Procedure: Left Heart Cath and Cors/Grafts Angiography;  Surgeon: Wellington Hampshire, MD;  Location: [place] [facility];  Service: Cardiovascular;  Laterality: [name];   CESAREAN SECTION     CHOLECYSTECTOMY     CORONARY ARTERY BYPASS GRAFT [name] 07/06/2016   Procedure: CORONARY ARTERY BYPASS GRAFTING (CABG) x four, using left internal mammary artery and right leg greater saphenous vein harvested endoscopically;  Surgeon: Ivin Poot, MD;  Location: [place];  Service: Open Heart Surgery;  Laterality: [name];   ENDARTERECTOMY Right 07/06/2016   Procedure: ENDARTERECTOMY CAROTID;  Surgeon: Rosetta Posner, MD;  Location: [place];  Service: Vascular;  Laterality: Right;   PERIPHERAL VASCULAR CATHETERIZATION [name] 04/18/2016   Procedure: Renal Angiography;  Surgeon: Wellington Hampshire, MD;  Location: [place] [facility];  Service: Cardiovascular;  Laterality: [name];   PERIPHERAL VASCULAR INTERVENTION Left 10/15/2018   Procedure: PERIPHERAL VASCULAR INTERVENTION;  Surgeon: Wellington Hampshire, MD;  Location: [place] [facility];  Service: Cardiovascular;  Laterality: Left;  Left superficial femoral   TEE WITHOUT CARDIOVERSION [name] 07/06/2016   Procedure: TRANSESOPHAGEAL ECHOCARDIOGRAM  (TEE);  Surgeon: Ivin Poot, MD;  Location: [place];  Service: Open Heart Surgery;  Laterality: [name];   TONSILLECTOMY       Current Meds  Medication Sig   albuterol (PROVENTIL HFA;VENTOLIN HFA) 108 (90 Base) MCG/ACT inhaler Inhale 2 puffs into the lungs every 6 (six) hours as needed for wheezing.   Alirocumab (PRALUENT) 150 MG/ML SOAJ Inject 150 mg into the skin every 14 (fourteen) days.   aspirin EC 81 MG tablet Take 1 tablet (81 mg total) by mouth daily.   benztropine (COGENTIN) 0.5 MG tablet Take 1 tablet (0.5 mg total) by mouth daily.   calcium carbonate (CALCIUM 600) 600 MG TABS tablet Take 1 tablet (600 mg total) by mouth 2 (two) times daily with a meal for 30 days.   carvedilol (COREG) 25 MG tablet Take 1 tablet (25 mg total) by mouth 2 (two) times daily with a meal.   chlorproMAZINE (THORAZINE) 50 MG tablet Take 1 tablet (50 mg total) by mouth at bedtime.   Cholecalciferol (VITAMIN D3) 125 MCG (5000 UT) CAPS Take 1 capsule (5,000 Units total) by mouth daily with breakfast. Take along with calcium and magnesium.   clonazePAM (KLONOPIN) 0.5 MG tablet Take one tab three times a day and 4th as needed   clopidogrel (PLAVIX) 75  MG tablet Take 1 tablet (75 mg total) by mouth daily.   cyclobenzaprine (FEXMID) 7.5 MG tablet TAKE 1 TABLET BY MOUTH 3 TIMES A DAY AS NEEDED FOR MUSCLE SPASMS   FLUoxetine HCl 60 MG TABS Take 60 mg by mouth daily.   gabapentin (NEURONTIN) 300 MG capsule Take 1-3 capsules (300-900 mg total) by mouth 4 (four) times daily. Follow the written titration schedule.   Insulin Glargine (LANTUS SOLOSTAR) 100 UNIT/ML Solostar Pen Inject 40 Units into the skin daily.   insulin NPH-regular Human (NOVOLIN 70/30) (70-30) 100 UNIT/ML injection UAD; 5u bid; may used up to 25u prn according to flow chart   Insulin Syringe-Needle U-100 29G X 1/2" 0.3 ML MISC 1 each by Does not apply route 2 (two) times daily.   isosorbide mononitrate (IMDUR) 30 MG 24 hr tablet TAKE 1  TABLET (30 MG TOTAL) BY MOUTH DAILY. PLEASE KEEP APPOINTMENT FOR FURTHER REFILLS! THANKS :) (Patient taking differently: Take 30 mg by mouth daily. )   lamoTRIgine (LAMICTAL) 200 MG tablet Take 1 tablet (200 mg total) by mouth daily.   losartan (COZAAR) 100 MG tablet Take 1 tablet (100 mg total) by mouth daily.   magic mouthwash SOLN Take 15 mLs by mouth 3 (three) times daily as needed for mouth pain.   Magnesium 500 MG CAPS Take 1 capsule (500 mg total) by mouth 2 (two) times daily at 8 am and 10 pm.   naloxone (NARCAN) nasal spray 4 mg/0.1 mL 4 mg (contents of 1 nasal spray) as a single dose in one nostril; may repeat every 2 to 3 minutes in alternating nostrils until medical assistance becomes available   nitroGLYCERIN (NITROSTAT) 0.4 MG SL tablet PLACE 1 TABLET (0.4 MG TOTAL) UNDER THE TONGUE EVERY 5 (FIVE) MINUTES AS NEEDED FOR CHEST PAIN.   ondansetron (ZOFRAN) 4 MG tablet TAKE 1 TABLET (4 MG TOTAL) BY MOUTH DAILY AS NEEDED.   oxyCODONE (OXY IR/ROXICODONE) 5 MG immediate release tablet Take 1 tablet (5 mg total) by mouth 2 (two) times daily as needed for up to 30 days for severe pain. Must last 30 days.   rosuvastatin (CRESTOR) 10 MG tablet Take 1 tablet (10 mg total) by mouth daily.   spironolactone (ALDACTONE) 25 MG tablet Take 1 tablet (25 mg total) by mouth daily.   Vitamin D, Ergocalciferol, (DRISDOL) 1.25 MG (50000 UT) CAPS capsule Take 1 capsule (50,000 Units total) by mouth 2 (two) times a week.     Allergies:   Patient has no known allergies.   Social History   Tobacco Use   Smoking status: Current Every Day Smoker    Packs/day: 1.00    Years: 27.00    Pack years: 27.00    Types: Cigarettes   Smokeless tobacco: Never Used   Tobacco comment: patient smokes when she drives.   Substance Use Topics   Alcohol use: Yes    Alcohol/week: 0.0 standard drinks    Comment: socially   Drug use: No     Family Hx: The patient's family history includes Alcohol abuse  in her father; Anxiety disorder in her sister; Diabetes in her father and mother; Drug abuse in her father; Heart disease in her father; Stroke in her sister. She was adopted.  ROS:   Please see the history of present illness.     All other systems reviewed and are negative.   Prior CV studies:   The following studies were reviewed today:  Reviewed recent vascular studies with her  Labs/Other  Tests and Data Reviewed:    EKG:  No ECG reviewed.  Recent Labs: 06/30/2018: B Natriuretic Peptide 21.0 01/07/2019: ALT 46; Magnesium 2.0 02/25/2019: BUN 10; Creatinine, Ser 0.62; Hemoglobin 16.1; Platelets 390; Potassium 4.9; Sodium 129 03/17/2019: TSH 0.89   Recent Lipid Panel Lab Results  Component Value Date/Time   CHOL 234 (H) 01/07/2019 09:24 AM   CHOL 121 08/21/2016 08:17 AM   TRIG 406 (H) 01/07/2019 09:24 AM   HDL 30 (L) 01/07/2019 09:24 AM   HDL 24 (L) 08/21/2016 08:17 AM   CHOLHDL 7.8 01/07/2019 09:24 AM   LDLCALC UNABLE TO CALCULATE IF TRIGLYCERIDE OVER 400 mg/dL 01/07/2019 09:24 AM   LDLCALC 64 08/21/2016 08:17 AM   LDLDIRECT 176.2 (H) 04/14/2019 08:45 AM    Wt Readings from Last 3 Encounters:  05/01/19 205 lb (93 kg)  03/17/19 203 lb 12.8 oz (92.4 kg)  03/05/19 210 lb (95.3 kg)     Objective:    Vital Signs:  BP (!) 186/118 (BP Location: Left Arm, Patient Position: Sitting, Cuff Size: Normal)    Pulse (!) 118    Ht 5\' 7"  (1.702 m)    Wt 205 lb (93 kg)    SpO2 95%    BMI 32.11 kg/m    VITAL SIGNS:  reviewed GEN:  no acute distress EYES:  sclerae anicteric, EOMI - Extraocular Movements Intact RESPIRATORY:  normal respiratory effort, symmetric expansion CARDIOVASCULAR:  no peripheral edema SKIN:  no rash, lesions or ulcers. MUSCULOSKELETAL:  no obvious deformities. NEURO:  alert and oriented x 3, no obvious focal deficit PSYCH:  normal affect  ASSESSMENT & PLAN:    1.  Peripheral arterial disease with severe recurrent left calf claudication: Rutherford class III.   Unfortunately, she has multiple uncontrolled risk factors and continued smoking.  Continue dual antiplatelet therapy and medical therapy for now.  If there is no improvement in her symptoms in the near future, she might require repeat angiography and endovascular intervention.    2. Coronary artery disease involving native coronary arteries with stable angina: Status post CABG in July of 2017.   Symptoms are controlled with long-acting nitroglycerin and a beta-blocker.  3. Carotid artery disease status post right carotid endarterectomy. Continue aggressive treatment of risk factors.  4. Essential hypertension:  Her blood pressure continues to be very elevated.  I added amlodipine 5 mg daily.  5. Hyperlipidemia: Continue treatment with rosuvastatin and Praluent.  6. Tobacco use: Smoking cessation advised   COVID-19 Education: The signs and symptoms of COVID-19 were discussed with the patient and how to seek care for testing (follow up with PCP or arrange E-visit).  The importance of social distancing was discussed today.  Time:   Today, I have spent 22 minutes with the patient with telehealth technology discussing the above problems.     Medication Adjustments/Labs and Tests Ordered: Current medicines are reviewed at length with the patient today.  Concerns regarding medicines are outlined above.   Tests Ordered: No orders of the defined types were placed in this encounter.   Medication Changes: No orders of the defined types were placed in this encounter.   Disposition:  Follow up in 2 month(s)  Signed, [name], MD  05/01/2019 2:38 PM    [place]

## 2019-05-02 ENCOUNTER — Other Ambulatory Visit (HOSPITAL_COMMUNITY): Payer: Self-pay | Admitting: Psychiatry

## 2019-05-02 DIAGNOSIS — F4310 Post-traumatic stress disorder, unspecified: Secondary | ICD-10-CM

## 2019-05-02 DIAGNOSIS — F331 Major depressive disorder, recurrent, moderate: Secondary | ICD-10-CM

## 2019-05-10 ENCOUNTER — Other Ambulatory Visit (HOSPITAL_COMMUNITY): Payer: Self-pay | Admitting: Psychiatry

## 2019-05-10 DIAGNOSIS — F410 Panic disorder [episodic paroxysmal anxiety] without agoraphobia: Secondary | ICD-10-CM

## 2019-05-10 DIAGNOSIS — F4310 Post-traumatic stress disorder, unspecified: Secondary | ICD-10-CM

## 2019-05-21 ENCOUNTER — Encounter (HOSPITAL_COMMUNITY): Payer: Self-pay | Admitting: Psychiatry

## 2019-05-21 ENCOUNTER — Other Ambulatory Visit: Payer: Self-pay

## 2019-05-21 ENCOUNTER — Ambulatory Visit (INDEPENDENT_AMBULATORY_CARE_PROVIDER_SITE_OTHER): Payer: BLUE CROSS/BLUE SHIELD | Admitting: Psychiatry

## 2019-05-21 DIAGNOSIS — F4310 Post-traumatic stress disorder, unspecified: Secondary | ICD-10-CM | POA: Diagnosis not present

## 2019-05-21 DIAGNOSIS — F331 Major depressive disorder, recurrent, moderate: Secondary | ICD-10-CM

## 2019-05-21 DIAGNOSIS — F410 Panic disorder [episodic paroxysmal anxiety] without agoraphobia: Secondary | ICD-10-CM | POA: Diagnosis not present

## 2019-05-21 MED ORDER — CLONAZEPAM 0.5 MG PO TABS: 0.5000 mg | ORAL_TABLET | Freq: Three times a day (TID) | ORAL | 2 refills | Status: DC | PRN

## 2019-05-21 MED ORDER — CHLORPROMAZINE HCL 50 MG PO TABS: 50.0000 mg | ORAL_TABLET | Freq: Every day | ORAL | 0 refills | Status: DC

## 2019-05-21 MED ORDER — FLUOXETINE HCL 60 MG PO TABS: 60.0000 mg | ORAL_TABLET | Freq: Every day | ORAL | 0 refills | Status: DC

## 2019-05-21 MED ORDER — BENZTROPINE MESYLATE 0.5 MG PO TABS: 0.5000 mg | ORAL_TABLET | Freq: Every day | ORAL | 0 refills | Status: DC

## 2019-05-21 MED ORDER — LAMOTRIGINE 200 MG PO TABS: 200.0000 mg | ORAL_TABLET | Freq: Every day | ORAL | 0 refills | Status: DC
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
## 2019-05-27 DIAGNOSIS — M545 Low back pain: Secondary | ICD-10-CM | POA: Diagnosis not present

## 2019-05-27 DIAGNOSIS — G44019 Episodic cluster headache, not intractable: Secondary | ICD-10-CM | POA: Diagnosis not present

## 2019-05-27 DIAGNOSIS — M9901 Segmental and somatic dysfunction of cervical region: Secondary | ICD-10-CM | POA: Diagnosis not present

## 2019-05-27 DIAGNOSIS — M9903 Segmental and somatic dysfunction of lumbar region: Secondary | ICD-10-CM | POA: Diagnosis not present

## 2019-05-28 ENCOUNTER — Ambulatory Visit (INDEPENDENT_AMBULATORY_CARE_PROVIDER_SITE_OTHER): Payer: BC Managed Care – PPO

## 2019-05-28 ENCOUNTER — Telehealth (INDEPENDENT_AMBULATORY_CARE_PROVIDER_SITE_OTHER): Payer: BC Managed Care – PPO | Admitting: Family Medicine

## 2019-05-28 ENCOUNTER — Encounter: Payer: Self-pay | Admitting: Family Medicine

## 2019-05-28 ENCOUNTER — Ambulatory Visit: Payer: BC Managed Care – PPO

## 2019-05-28 ENCOUNTER — Other Ambulatory Visit: Payer: Self-pay

## 2019-05-28 VITALS — BP 124/84 | HR 76 | Temp 97.1°F | Ht 67.0 in | Wt 205.0 lb

## 2019-05-28 DIAGNOSIS — M25551 Pain in right hip: Secondary | ICD-10-CM

## 2019-05-28 DIAGNOSIS — M25552 Pain in left hip: Secondary | ICD-10-CM

## 2019-05-28 NOTE — Progress Notes (Signed)
Virtual Visit via Video   Due to the COVID-19 pandemic, this visit was completed with telemedicine (audio/video) technology to reduce patient and provider exposure as well as to preserve personal protective equipment.   I connected with [name] by a video enabled telemedicine application and verified that I am speaking with the correct person using two identifiers. Location patient: Home Location provider: [facility], Office Persons participating in the virtual visit: Yianna M Wagler, Evey Mcmahan, MD   I discussed the limitations of evaluation and management by telemedicine and the availability of in person appointments. The patient expressed understanding and agreed to proceed.  Care Team   Patient Care Team: Lucille Passy, MD as PCP - General Wellington Hampshire, MD as PCP - Cardiology (Cardiology) Wellington Hampshire, MD as Consulting Physician (Cardiology)  Subjective:   HPI:   Bilateral hip pain- left is worse than right-  Ongoing for years , getting worse, seen chiropractor - suggested X-ray to see if its bursitis/arthritis, pain level : 8  She has been to ortho but xrays were not done.  She does not feel this is related to her PAD.  Pain is worse with extension and flexion.    Review of Systems  Constitutional: Negative.   HENT: Negative.   Eyes: Negative.   Respiratory: Negative.   Cardiovascular: Negative.   Gastrointestinal: Negative.   Genitourinary: Negative.   Musculoskeletal: Positive for joint pain.  Skin: Negative.   Neurological: Negative.   Endo/Heme/Allergies: Negative.   Psychiatric/Behavioral: Negative.      Patient Active Problem List   Diagnosis Date Noted   Right hip pain 05/28/2019   History of carotid endarterectomy (Right) 03/04/2019   Pain medication agreement signed 03/04/2019   Atypical facial pain (Right) 01/27/2019   Chronic ear pain (Right) 01/27/2019   Chronic jaw pain (Right) 01/27/2019   Geniculate  Neuralgia (Right) 01/27/2019   Vitamin D deficiency 01/19/2019   Neurogenic pain 01/19/2019   Chronic anticoagulation (PLAVIX) 01/19/2019   Chronic pain syndrome 01/07/2019   Long term current use of opiate analgesic 01/07/2019   Long term prescription benzodiazepine use 01/07/2019   Pharmacologic therapy 01/07/2019   Disorder of skeletal system 01/07/2019   Problems influencing health status 01/07/2019   Chronic headaches (Primary Area of Pain) (Right) 01/07/2019   Opiate use 09/24/2018   Insect bite 09/08/2018   Edema 07/01/2018   Myalgia 06/24/2018   Chronic ankle pain (Bilateral) 12/27/2017   Tendinopathy of right gluteus medius 12/27/2017   Tendinopathy of left gluteus medius 12/27/2017   Bilateral hip pain 12/26/2017   Other chronic pain 10/17/2017   Elevated troponin I level 05/21/2017   Major depressive disorder, recurrent episode, moderate (HCC) 11/19/2016   Insomnia 10/30/2016   Hypertension, accelerated with heart disease, without CHF    Constipation 07/25/2016   S/P CABG x 4 07/06/2016   Bradycardia    CAD (coronary artery disease)    Carotid stenosis    CAD in native artery 06/29/2016   Elevated troponin 06/28/2016   Essential hypertension, malignant 06/28/2016   Tobacco abuse 06/28/2016   Essential hypertension    Malignant hypertension    Diabetes ([place])    Chest pain with high risk for cardiac etiology 06/27/2016   NSTEMI (non-ST elevated myocardial infarction) ([place]) 06/27/2016   Proteinuria 03/15/2016   Renal artery stenosis ([place]) 03/15/2016   MDD (major depressive disorder) 10/17/2015   Agoraphobia with panic attacks 04/25/2015   HTN (hypertension), malignant 10/20/2013   Cluster headache 03/20/2012  HLD (hyperlipidemia) 07/26/2010   ADJUSTMENT DISORDER WITH MIXED FEATURES 07/26/2010    Social History   Tobacco Use   Smoking status: Current Every Day Smoker    Packs/day: 1.00    Years: 27.00    Pack  years: 27.00    Types: Cigarettes   Smokeless tobacco: Never Used   Tobacco comment: patient smokes when she drives.   Substance Use Topics   Alcohol use: Yes    Alcohol/week: 0.0 standard drinks    Comment: socially    Current Outpatient Medications:    albuterol (PROVENTIL HFA;VENTOLIN HFA) 108 (90 Base) MCG/ACT inhaler, Inhale 2 puffs into the lungs every 6 (six) hours as needed for wheezing., Disp: 1 Inhaler, Rfl: 0   Alirocumab (PRALUENT) 150 MG/ML SOAJ, Inject 150 mg into the skin every 14 (fourteen) days., Disp: 2 pen, Rfl: 11   amLODipine (NORVASC) 5 MG tablet, Take 1 tablet (5 mg total) by mouth daily., Disp: 30 tablet, Rfl: 6   aspirin EC 81 MG tablet, Take 1 tablet (81 mg total) by mouth daily., Disp: 90 tablet, Rfl: 3   benztropine (COGENTIN) 0.5 MG tablet, Take 1 tablet (0.5 mg total) by mouth daily., Disp: 90 tablet, Rfl: 0   carvedilol (COREG) 25 MG tablet, Take 1 tablet (25 mg total) by mouth 2 (two) times daily with a meal., Disp: 60 tablet, Rfl: 5   chlorproMAZINE (THORAZINE) 50 MG tablet, Take 1 tablet (50 mg total) by mouth at bedtime., Disp: 90 tablet, Rfl: 0   Cholecalciferol (VITAMIN D3) 125 MCG (5000 UT) CAPS, Take 1 capsule (5,000 Units total) by mouth daily with breakfast. Take along with calcium and magnesium., Disp: 30 capsule, Rfl: 5   clonazePAM (KLONOPIN) 0.5 MG tablet, Take 1 tablet (0.5 mg total) by mouth 3 (three) times daily as needed for anxiety., Disp: 90 tablet, Rfl: 2   clopidogrel (PLAVIX) 75 MG tablet, Take 1 tablet (75 mg total) by mouth daily., Disp: 30 tablet, Rfl: 11   cyclobenzaprine (FEXMID) 7.5 MG tablet, TAKE 1 TABLET BY MOUTH 3 TIMES A DAY AS NEEDED FOR MUSCLE SPASMS, Disp: 90 tablet, Rfl: 5   FLUoxetine HCl 60 MG TABS, Take 60 mg by mouth daily., Disp: 90 tablet, Rfl: 0   gabapentin (NEURONTIN) 300 MG capsule, Take 1-3 capsules (300-900 mg total) by mouth 4 (four) times daily. Follow the written titration schedule., Disp: 360  capsule, Rfl: 2   Insulin Glargine (LANTUS SOLOSTAR) 100 UNIT/ML Solostar Pen, Inject 40 Units into the skin daily., Disp: 5 pen, Rfl: PRN   insulin NPH-regular Human (NOVOLIN 70/30) (70-30) 100 UNIT/ML injection, UAD; 5u bid; may used up to 25u prn according to flow chart, Disp: 10 mL, Rfl: PRN   Insulin Syringe-Needle U-100 29G X 1/2" 0.3 ML MISC, 1 each by Does not apply route 2 (two) times daily., Disp: 30 each, Rfl: 0   isosorbide mononitrate (IMDUR) 30 MG 24 hr tablet, TAKE 1 TABLET (30 MG TOTAL) BY MOUTH DAILY. PLEASE KEEP APPOINTMENT FOR FURTHER REFILLS! THANKS :) (Patient taking differently: Take 30 mg by mouth daily. ), Disp: 90 tablet, Rfl: 3   lamoTRIgine (LAMICTAL) 200 MG tablet, Take 1 tablet (200 mg total) by mouth daily., Disp: 90 tablet, Rfl: 0   losartan (COZAAR) 100 MG tablet, Take 1 tablet (100 mg total) by mouth daily., Disp: 90 tablet, Rfl: 1   magic mouthwash SOLN, Take 15 mLs by mouth 3 (three) times daily as needed for mouth pain., Disp: 250 mL, Rfl: 1  Magnesium 500 MG CAPS, Take 1 capsule (500 mg total) by mouth 2 (two) times daily at 8 am and 10 pm., Disp: 60 capsule, Rfl: 5   naloxone (NARCAN) nasal spray 4 mg/0.1 mL, 4 mg (contents of 1 nasal spray) as a single dose in one nostril; may repeat every 2 to 3 minutes in alternating nostrils until medical assistance becomes available, Disp: 1 kit, Rfl: 0   nitroGLYCERIN (NITROSTAT) 0.4 MG SL tablet, PLACE 1 TABLET (0.4 MG TOTAL) UNDER THE TONGUE EVERY 5 (FIVE) MINUTES AS NEEDED FOR CHEST PAIN., Disp: 25 tablet, Rfl: PRN   ondansetron (ZOFRAN) 4 MG tablet, TAKE 1 TABLET (4 MG TOTAL) BY MOUTH DAILY AS NEEDED., Disp: 10 tablet, Rfl: 2   oxyCODONE (OXY IR/ROXICODONE) 5 MG immediate release tablet, Take 1 tablet (5 mg total) by mouth 2 (two) times daily as needed for up to 30 days for severe pain. Must last 30 days., Disp: 30 tablet, Rfl: 0   [START ON 06/02/2019] oxyCODONE (OXY IR/ROXICODONE) 5 MG immediate release  tablet, Take 1 tablet (5 mg total) by mouth 2 (two) times daily as needed for up to 30 days for severe pain. Must last 30 days., Disp: 30 tablet, Rfl: 0   rosuvastatin (CRESTOR) 10 MG tablet, Take 1 tablet (10 mg total) by mouth daily., Disp: 90 tablet, Rfl: 3   spironolactone (ALDACTONE) 25 MG tablet, Take 1 tablet (25 mg total) by mouth daily., Disp: 90 tablet, Rfl: 3   Vitamin D, Ergocalciferol, (DRISDOL) 1.25 MG (50000 UT) CAPS capsule, Take 1 capsule (50,000 Units total) by mouth 2 (two) times a week., Disp: 24 capsule, Rfl: 0   calcium carbonate (CALCIUM 600) 600 MG TABS tablet, Take 1 tablet (600 mg total) by mouth 2 (two) times daily with a meal for 30 days., Disp: 60 tablet, Rfl: 0   oxyCODONE (OXY IR/ROXICODONE) 5 MG immediate release tablet, Take 1 tablet (5 mg total) by mouth 2 (two) times daily as needed for up to 30 days for severe pain. Must last 30 days., Disp: 60 tablet, Rfl: 0  No Known Allergies  Objective:  BP 124/84 Comment: bp cuff @ hm   Pulse 76    Temp (!) 97.1 F (36.2 C) (Oral)    Ht '5\' 7"'$  (1.702 m)    Wt 205 lb (93 kg)    SpO2 95% Comment: Pluse ox @ hm   BMI 32.11 kg/m   VITALS: Per patient if applicable, see vitals. GENERAL: Alert, appears well and in no acute distress. HEENT: Atraumatic, conjunctiva clear, no obvious abnormalities on inspection of external nose and ears. NECK: Normal movements of the head and neck. CARDIOPULMONARY: No increased WOB. Speaking in clear sentences. I:E ratio WNL.  MS: Moves all visible extremities without noticeable abnormality. PSYCH: Pleasant and cooperative, well-groomed. Speech normal rate and rhythm. Affect is appropriate. Insight and judgement are appropriate. Attention is focused, linear, and appropriate.  NEURO: CN grossly intact. Oriented as arrived to appointment on time with no prompting. Moves both UE equally.  SKIN: No obvious lesions, wounds, erythema, or cyanosis noted on face or hands.  Depression screen [facility] 2/9  02/19/2019 01/27/2019 01/19/2019  Decreased Interest 0 0 0  Down, Depressed, Hopeless 0 0 0  PHQ - 2 Score 0 0 0  Altered sleeping - - -  Tired, decreased energy - - -  Change in appetite - - -  Feeling bad or failure about yourself  - - -  Trouble concentrating - - -  Moving slowly or fidgety/restless - - -  Suicidal thoughts - - -  PHQ-9 Score - - -  Difficult doing work/chores - - -  Some recent data might be hidden    Assessment and Plan:   Erika [name] was seen today for hip pain.  Diagnoses and all orders for this visit:  Left hip pain -     DG Hip Unilat W OR W/O Pelvis Min 4 Views Left; Future  Right hip pain     COVID-19 Education: The signs and symptoms of COVID-19 were discussed with the patient and how to seek care for testing if needed. The importance of social distancing was discussed today.  Reviewed expectations re: course of current medical issues.  Discussed self-management of symptoms.  Outlined signs and symptoms indicating need for more acute intervention.  Patient verbalized understanding and all questions were answered.  Health Maintenance issues including appropriate healthy diet, exercise, and smoking avoidance were discussed with patient.  See orders for this visit as documented in the electronic medical record.  Arnette Norris, MD  Records requested if needed. Time spent: 25  minutes, of which >50% was spent in obtaining information about her symptoms, reviewing her previous labs, evaluations, and treatments, counseling her about her condition (please see the discussed topics above), and developing a plan to further investigate it; she had a number of questions which I addressed.

## 2019-05-28 NOTE — Addendum Note (Signed)
Addended by: Lynnea Ferrier on: 05/28/2019 09:19 AM   Modules accepted: Orders

## 2019-05-28 NOTE — Assessment & Plan Note (Signed)
Deteriorated, left > right. ? Bursitis.  Will check xray first as initial work up. The patient indicates understanding of these issues and agrees with the plan. Orders Placed This Encounter  Procedures  . DG Hip Unilat W OR W/O Pelvis Min 4 Views Left

## 2019-05-29 ENCOUNTER — Other Ambulatory Visit: Payer: Self-pay | Admitting: Family Medicine

## 2019-05-29 DIAGNOSIS — M25551 Pain in right hip: Secondary | ICD-10-CM

## 2019-05-29 DIAGNOSIS — M25552 Pain in left hip: Secondary | ICD-10-CM

## 2019-06-03 ENCOUNTER — Other Ambulatory Visit: Payer: Self-pay | Admitting: Pain Medicine

## 2019-06-03 ENCOUNTER — Telehealth: Payer: Self-pay | Admitting: Pain Medicine

## 2019-06-03 DIAGNOSIS — G894 Chronic pain syndrome: Secondary | ICD-10-CM

## 2019-06-03 MED ORDER — OXYCODONE HCL 5 MG PO TABS: 5.0000 mg | ORAL_TABLET | Freq: Two times a day (BID) | ORAL | 0 refills | Status: DC | PRN

## 2019-06-03 NOTE — Telephone Encounter (Signed)
Please call the pharmacy and tell him to delete the prescription, I will be sending another one today for the correct amount.

## 2019-06-03 NOTE — Telephone Encounter (Signed)
[facility] called and would like clarification for rx of Oxycodone 5mg  need clarification on directions it was written 1 twice day must last 30 days and only a quantity of 30, per pharmacy pt states it should be 60.

## 2019-06-08 DIAGNOSIS — M7062 Trochanteric bursitis, left hip: Secondary | ICD-10-CM | POA: Diagnosis not present

## 2019-06-08 DIAGNOSIS — M1612 Unilateral primary osteoarthritis, left hip: Secondary | ICD-10-CM | POA: Diagnosis not present

## 2019-06-15 ENCOUNTER — Other Ambulatory Visit: Payer: Self-pay | Admitting: Pain Medicine

## 2019-06-15 DIAGNOSIS — M792 Neuralgia and neuritis, unspecified: Secondary | ICD-10-CM

## 2019-06-23 ENCOUNTER — Other Ambulatory Visit: Payer: Self-pay

## 2019-06-23 MED ORDER — ONDANSETRON HCL 4 MG PO TABS: 4.0000 mg | ORAL_TABLET | Freq: Every day | ORAL | 2 refills | Status: DC | PRN

## 2019-06-25 ENCOUNTER — Telehealth: Payer: Self-pay

## 2019-06-25 ENCOUNTER — Encounter: Payer: Self-pay | Admitting: Pain Medicine

## 2019-06-25 ENCOUNTER — Telehealth (INDEPENDENT_AMBULATORY_CARE_PROVIDER_SITE_OTHER): Payer: BC Managed Care – PPO | Admitting: Family Medicine

## 2019-06-25 ENCOUNTER — Encounter: Payer: Self-pay | Admitting: Family Medicine

## 2019-06-25 VITALS — BP 178/92 | HR 111 | Wt 205.0 lb

## 2019-06-25 DIAGNOSIS — Z1231 Encounter for screening mammogram for malignant neoplasm of breast: Secondary | ICD-10-CM

## 2019-06-25 DIAGNOSIS — E785 Hyperlipidemia, unspecified: Secondary | ICD-10-CM | POA: Diagnosis not present

## 2019-06-25 DIAGNOSIS — E559 Vitamin D deficiency, unspecified: Secondary | ICD-10-CM

## 2019-06-25 DIAGNOSIS — E1169 Type 2 diabetes mellitus with other specified complication: Secondary | ICD-10-CM

## 2019-06-25 DIAGNOSIS — I10 Essential (primary) hypertension: Secondary | ICD-10-CM | POA: Diagnosis not present

## 2019-06-25 DIAGNOSIS — R238 Other skin changes: Secondary | ICD-10-CM

## 2019-06-25 DIAGNOSIS — R233 Spontaneous ecchymoses: Secondary | ICD-10-CM | POA: Insufficient documentation

## 2019-06-25 MED ORDER — ONDANSETRON HCL 4 MG PO TABS: 4.0000 mg | ORAL_TABLET | Freq: Every day | ORAL | 5 refills | Status: DC | PRN

## 2019-06-25 MED ORDER — INSULIN ASPART PROT & ASPART (70-30 MIX) 100 UNIT/ML ~~LOC~~ SUSP: SUBCUTANEOUS | 11 refills | Status: DC

## 2019-06-25 NOTE — Telephone Encounter (Signed)
Questions for Screening COVID-19  Symptom onset: None  Travel or Contacts: None  During this illness, did/does the patient experience any of the following symptoms? Fever >100.37F []   Yes [x]   No []   Unknown Subjective fever (felt feverish) []   Yes [x]   No []   Unknown Chills []   Yes [x]   No []   Unknown Muscle aches (myalgia) []   Yes [x]   No []   Unknown Runny nose (rhinorrhea) []   Yes [x]   No []   Unknown Sore throat []   Yes [x]   No []   Unknown Cough (new onset or worsening of chronic cough) []   Yes [x]   No []   Unknown Shortness of breath (dyspnea) []   Yes [x]   No []   Unknown Nausea or vomiting []   Yes [x]   No []   Unknown Headache []   Yes [x]   No []   Unknown Abdominal pain  []   Yes [x]   No []   Unknown Diarrhea (?3 loose/looser than normal stools/24hr period) []   Yes [x]   No []   Unknown Other, specify:  Patient risk factors: Smoker? []   Current []   Former []   Never If female, currently pregnant? []   Yes []   No  Patient Active Problem List   Diagnosis Date Noted  . Right hip pain 05/28/2019  . History of carotid endarterectomy (Right) 03/04/2019  . Pain medication agreement signed 03/04/2019  . Atypical facial pain (Right) 01/27/2019  . Chronic ear pain (Right) 01/27/2019  . Chronic jaw pain (Right) 01/27/2019  . Geniculate Neuralgia (Right) 01/27/2019  . Vitamin Alrick Cubbage deficiency 01/19/2019  . Neurogenic pain 01/19/2019  . Chronic anticoagulation (PLAVIX) 01/19/2019  . Chronic pain syndrome 01/07/2019  . Long term current use of opiate analgesic 01/07/2019  . Long term prescription benzodiazepine use 01/07/2019  . Pharmacologic therapy 01/07/2019  . Disorder of skeletal system 01/07/2019  . Problems influencing health status 01/07/2019  . Chronic headaches (Primary Area of Pain) (Right) 01/07/2019  . Opiate use 09/24/2018  . Insect bite 09/08/2018  . Edema 07/01/2018  . Myalgia 06/24/2018  . Chronic ankle pain (Bilateral) 12/27/2017  . Tendinopathy of right gluteus medius  12/27/2017  . Tendinopathy of left gluteus medius 12/27/2017  . Bilateral hip pain 12/26/2017  . Other chronic pain 10/17/2017  . Elevated troponin I level 05/21/2017  . Major depressive disorder, recurrent episode, moderate ([place]) 11/19/2016  . Insomnia 10/30/2016  . Hypertension, accelerated with heart disease, without CHF   . Constipation 07/25/2016  . S/P CABG x 4 07/06/2016  . Bradycardia   . CAD (coronary artery disease)   . Carotid stenosis   . CAD in native artery 06/29/2016  . Elevated troponin 06/28/2016  . Essential hypertension, malignant 06/28/2016  . Tobacco abuse 06/28/2016  . Essential hypertension   . Malignant hypertension   . Diabetes ([place])   . Chest pain with high risk for cardiac etiology 06/27/2016  . NSTEMI (non-ST elevated myocardial infarction) ([place]) 06/27/2016  . Proteinuria 03/15/2016  . Renal artery stenosis ([place]) 03/15/2016  . MDD (major depressive disorder) 10/17/2015  . Agoraphobia with panic attacks 04/25/2015  . HTN (hypertension), malignant 10/20/2013  . Cluster headache 03/20/2012  . HLD (hyperlipidemia) 07/26/2010  . ADJUSTMENT DISORDER WITH MIXED FEATURES 07/26/2010    Plan:  []   High risk for COVID-19 with red flags go to ED (with CP, SOB, weak/lightheaded, or fever > 101.5). Call ahead.  []   High risk for COVID-19 but stable. Inform provider and coordinate time for [facility] visit.   []   No red flags but  URI signs or symptoms okay for [facility] visit.

## 2019-06-25 NOTE — Progress Notes (Signed)
Virtual Visit via Video   Due to the COVID-19 pandemic, this visit was completed with telemedicine (audio/video) technology to reduce patient and provider exposure as well as to preserve personal protective equipment.   I connected with Erika [name] by a video enabled telemedicine application and verified that I am speaking with the correct person using two identifiers. Location patient: Home Location provider: [facility], Office Persons participating in the virtual visit: Artisha M Curley, Kristoph Sattler, MD   I discussed the limitations of evaluation and management by telemedicine and the availability of in person appointments. The patient expressed understanding and agreed to proceed.  Care Team   Patient Care Team: Lucille Passy, MD as PCP - General Wellington Hampshire, MD as PCP - Cardiology (Cardiology) Wellington Hampshire, MD as Consulting Physician (Cardiology)  Subjective:   HPI:   Bruising-  She is C/O having multiple bruises. She denies any injury. Had one on forehead, her ear, and the back of her arm. Denies any blood in stool or urine or nose. Pt is sched for lab visit on Monday.  They are tender to touch.  Has never had anything like this before.    She did recently start Praluent injections for HLD.  Cannot think of any other complaints or symptoms associated with it.  . She is needing Mammogram at [place]. Review of Systems  Constitutional: Negative.   HENT: Negative.   Respiratory: Negative.   Cardiovascular: Negative.   Gastrointestinal: Negative.   Genitourinary: Negative.   Musculoskeletal: Negative.   Skin: Negative.   Neurological: Negative.   Endo/Heme/Allergies: Bruises/bleeds easily.  Psychiatric/Behavioral: Negative.   All other systems reviewed and are negative.    Patient Active Problem List   Diagnosis Date Noted  . Right hip pain 05/28/2019  . History of carotid endarterectomy (Right) 03/04/2019  . Pain medication agreement signed  03/04/2019  . Atypical facial pain (Right) 01/27/2019  . Chronic ear pain (Right) 01/27/2019  . Chronic jaw pain (Right) 01/27/2019  . Geniculate Neuralgia (Right) 01/27/2019  . Vitamin D deficiency 01/19/2019  . Neurogenic pain 01/19/2019  . Chronic anticoagulation (PLAVIX) 01/19/2019  . Chronic pain syndrome 01/07/2019  . Long term current use of opiate analgesic 01/07/2019  . Long term prescription benzodiazepine use 01/07/2019  . Pharmacologic therapy 01/07/2019  . Disorder of skeletal system 01/07/2019  . Problems influencing health status 01/07/2019  . Chronic headaches (Primary Area of Pain) (Right) 01/07/2019  . Opiate use 09/24/2018  . Insect bite 09/08/2018  . Edema 07/01/2018  . Myalgia 06/24/2018  . Chronic ankle pain (Bilateral) 12/27/2017  . Tendinopathy of right gluteus medius 12/27/2017  . Tendinopathy of left gluteus medius 12/27/2017  . Bilateral hip pain 12/26/2017  . Other chronic pain 10/17/2017  . Elevated troponin I level 05/21/2017  . Major depressive disorder, recurrent episode, moderate ([place]) 11/19/2016  . Insomnia 10/30/2016  . Hypertension, accelerated with heart disease, without CHF   . Constipation 07/25/2016  . S/P CABG x 4 07/06/2016  . Bradycardia   . CAD (coronary artery disease)   . Carotid stenosis   . CAD in native artery 06/29/2016  . Elevated troponin 06/28/2016  . Essential hypertension, malignant 06/28/2016  . Tobacco abuse 06/28/2016  . Essential hypertension   . Malignant hypertension   . Diabetes ([place])   . Chest pain with high risk for cardiac etiology 06/27/2016  . NSTEMI (non-ST elevated myocardial infarction) ([place]) 06/27/2016  . Proteinuria 03/15/2016  . Renal artery stenosis ([place]) 03/15/2016  .  MDD (major depressive disorder) 10/17/2015  . Agoraphobia with panic attacks 04/25/2015  . HTN (hypertension), malignant 10/20/2013  . Cluster headache 03/20/2012  . HLD (hyperlipidemia) 07/26/2010  . ADJUSTMENT DISORDER WITH  MIXED FEATURES 07/26/2010    Social History   Tobacco Use  . Smoking status: Current Every Day Smoker    Packs/day: 1.00    Years: 27.00    Pack years: 27.00    Types: Cigarettes  . Smokeless tobacco: Never Used  . Tobacco comment: patient smokes when she drives.   Substance Use Topics  . Alcohol use: Yes    Alcohol/week: 0.0 standard drinks    Comment: socially    Current Outpatient Medications:  .  albuterol (PROVENTIL HFA;VENTOLIN HFA) 108 (90 Base) MCG/ACT inhaler, Inhale 2 puffs into the lungs every 6 (six) hours as needed for wheezing., Disp: 1 Inhaler, Rfl: 0 .  Alirocumab (PRALUENT) 150 MG/ML SOAJ, Inject 150 mg into the skin every 14 (fourteen) days., Disp: 2 pen, Rfl: 11 .  amLODipine (NORVASC) 5 MG tablet, Take 1 tablet (5 mg total) by mouth daily., Disp: 30 tablet, Rfl: 6 .  aspirin EC 81 MG tablet, Take 1 tablet (81 mg total) by mouth daily., Disp: 90 tablet, Rfl: 3 .  benztropine (COGENTIN) 0.5 MG tablet, Take 1 tablet (0.5 mg total) by mouth daily., Disp: 90 tablet, Rfl: 0 .  calcium carbonate (CALCIUM 600) 600 MG TABS tablet, Take 1 tablet (600 mg total) by mouth 2 (two) times daily with a meal for 30 days., Disp: 60 tablet, Rfl: 0 .  carvedilol (COREG) 25 MG tablet, Take 1 tablet (25 mg total) by mouth 2 (two) times daily with a meal., Disp: 60 tablet, Rfl: 5 .  chlorproMAZINE (THORAZINE) 50 MG tablet, Take 1 tablet (50 mg total) by mouth at bedtime., Disp: 90 tablet, Rfl: 0 .  Cholecalciferol (VITAMIN D3) 125 MCG (5000 UT) CAPS, Take 1 capsule (5,000 Units total) by mouth daily with breakfast. Take along with calcium and magnesium., Disp: 30 capsule, Rfl: 5 .  clonazePAM (KLONOPIN) 0.5 MG tablet, Take 1 tablet (0.5 mg total) by mouth 3 (three) times daily as needed for anxiety., Disp: 90 tablet, Rfl: 2 .  clopidogrel (PLAVIX) 75 MG tablet, Take 1 tablet (75 mg total) by mouth daily., Disp: 30 tablet, Rfl: 11 .  cyclobenzaprine (FEXMID) 7.5 MG tablet, TAKE 1 TABLET BY  MOUTH 3 TIMES A DAY AS NEEDED FOR MUSCLE SPASMS, Disp: 90 tablet, Rfl: 5 .  FLUoxetine HCl 60 MG TABS, Take 60 mg by mouth daily., Disp: 90 tablet, Rfl: 0 .  gabapentin (NEURONTIN) 300 MG capsule, Take 1-3 capsules (300-900 mg total) by mouth 4 (four) times daily. Follow the written titration schedule., Disp: 360 capsule, Rfl: 2 .  Insulin Glargine (LANTUS SOLOSTAR) 100 UNIT/ML Solostar Pen, Inject 40 Units into the skin daily., Disp: 5 pen, Rfl: PRN .  insulin NPH-regular Human (NOVOLIN 70/30) (70-30) 100 UNIT/ML injection, UAD; 5u bid; may used up to 25u prn according to flow chart, Disp: 10 mL, Rfl: PRN .  Insulin Syringe-Needle U-100 29G X 1/2" 0.3 ML MISC, 1 each by Does not apply route 2 (two) times daily., Disp: 30 each, Rfl: 0 .  isosorbide mononitrate (IMDUR) 30 MG 24 hr tablet, TAKE 1 TABLET (30 MG TOTAL) BY MOUTH DAILY. PLEASE KEEP APPOINTMENT FOR FURTHER REFILLS! THANKS :) (Patient taking differently: Take 30 mg by mouth daily. ), Disp: 90 tablet, Rfl: 3 .  lamoTRIgine (LAMICTAL) 200 MG tablet, Take 1  tablet (200 mg total) by mouth daily., Disp: 90 tablet, Rfl: 0 .  losartan (COZAAR) 100 MG tablet, Take 1 tablet (100 mg total) by mouth daily., Disp: 90 tablet, Rfl: 1 .  magic mouthwash SOLN, Take 15 mLs by mouth 3 (three) times daily as needed for mouth pain., Disp: 250 mL, Rfl: 1 .  Magnesium 500 MG CAPS, Take 1 capsule (500 mg total) by mouth 2 (two) times daily at 8 am and 10 pm., Disp: 60 capsule, Rfl: 5 .  naloxone (NARCAN) nasal spray 4 mg/0.1 mL, 4 mg (contents of 1 nasal spray) as a single dose in one nostril; may repeat every 2 to 3 minutes in alternating nostrils until medical assistance becomes available, Disp: 1 kit, Rfl: 0 .  nitroGLYCERIN (NITROSTAT) 0.4 MG SL tablet, PLACE 1 TABLET (0.4 MG TOTAL) UNDER THE TONGUE EVERY 5 (FIVE) MINUTES AS NEEDED FOR CHEST PAIN., Disp: 25 tablet, Rfl: PRN .  ondansetron (ZOFRAN) 4 MG tablet, Take 1 tablet (4 mg total) by mouth daily as needed.,  Disp: 20 tablet, Rfl: 5 .  rosuvastatin (CRESTOR) 10 MG tablet, Take 1 tablet (10 mg total) by mouth daily., Disp: 90 tablet, Rfl: 3 .  spironolactone (ALDACTONE) 25 MG tablet, Take 1 tablet (25 mg total) by mouth daily., Disp: 90 tablet, Rfl: 3 .  Vitamin D, Ergocalciferol, (DRISDOL) 1.25 MG (50000 UT) CAPS capsule, Take 1 capsule (50,000 Units total) by mouth 2 (two) times a week., Disp: 24 capsule, Rfl: 0 .  oxyCODONE (OXY IR/ROXICODONE) 5 MG immediate release tablet, Take 1 tablet (5 mg total) by mouth 2 (two) times daily as needed for up to 30 days for severe pain. Must last 30 days., Disp: 60 tablet, Rfl: 0  No Known Allergies   VITALS: Per patient if applicable, see vitals. GENERAL: Alert, appears well and in no acute distress. HEENT: Atraumatic, conjunctiva clear, no obvious abnormalities on inspection of external nose and ears. NECK: Normal movements of the head and neck. CARDIOPULMONARY: No increased WOB. Speaking in clear sentences. I:E ratio WNL.  MS: Moves all visible extremities without noticeable abnormality. PSYCH: Pleasant and cooperative, well-groomed. Speech normal rate and rhythm. Affect is appropriate. Insight and judgement are appropriate. Attention is focused, linear, and appropriate.  NEURO: CN grossly intact. Oriented as arrived to appointment on time with no prompting. Moves both UE equally.  SKIN: hard to see the bruises over video- pinkish? Rather than purple?  Depression screen [facility] 2/9 02/19/2019 01/27/2019 01/19/2019  Decreased Interest 0 0 0  Down, Depressed, Hopeless 0 0 0  PHQ - 2 Score 0 0 0  Altered sleeping - - -  Tired, decreased energy - - -  Change in appetite - - -  Feeling bad or failure about yourself  - - -  Trouble concentrating - - -  Moving slowly or fidgety/restless - - -  Suicidal thoughts - - -  PHQ-9 Score - - -  Difficult doing work/chores - - -  Some recent data might be hidden    Assessment and Plan:   Jaretssi was seen today for  bleeding/bruising.  Diagnoses and all orders for this visit:  Type 2 diabetes mellitus with other specified complication, without long-term current use of insulin (HCC) -     HgB A1c; Future -     Lipid Profile; Future -     LDL cholesterol, direct; Future -     Comp Met (CMET); Future  Vitamin D deficiency -     VITAMIN  D 25 Hydroxy (Vit-D Deficiency, Fractures); Future  Essential hypertension, malignant -     HgB A1c; Future -     Lipid Profile; Future -     LDL cholesterol, direct; Future -     Comp Met (CMET); Future  Hyperlipidemia, unspecified hyperlipidemia type -     HgB A1c; Future -     Lipid Profile; Future -     LDL cholesterol, direct; Future -     Comp Met (CMET); Future  Screening mammogram, encounter for -     MM 3D SCREEN BREAST BILATERAL; Future  Abnormal bruising -     CBC w/Diff; Future -     Iron, TIBC and Ferritin Panel; Future  Other orders -     ondansetron (ZOFRAN) 4 MG tablet; Take 1 tablet (4 mg total) by mouth daily as needed.    Marland Kitchen COVID-19 Education: The signs and symptoms of COVID-19 were discussed with the patient and how to seek care for testing if needed. The importance of social distancing was discussed today. . Reviewed expectations re: course of current medical issues. . Discussed self-management of symptoms. . Outlined signs and symptoms indicating need for more acute intervention. . Patient verbalized understanding and all questions were answered. Marland Kitchen Health Maintenance issues including appropriate healthy diet, exercise, and smoking avoidance were discussed with patient. . See orders for this visit as documented in the electronic medical record.  Arnette Norris, MD  Records requested if needed. Time spent: 25 minutes, of which >50% was spent in obtaining information about her symptoms, reviewing her previous labs, evaluations, and treatments, counseling her about her condition (please see the discussed topics above), and developing a  plan to further investigate it; she had a number of questions which I addressed.

## 2019-06-25 NOTE — Assessment & Plan Note (Signed)
>  25 minutes spent in face to face time with patient, >50% spent in counselling or coordination of care. Unclear the cause of this but when I looked up potential side effects/adverse reactions of praluent, hematological side effects were listed- including bruising.  Given her aready know circulatory issues, perhaps she is more prone to this? Will check labs and likely consult cardiology about this.   The patient indicates understanding of these issues and agrees with the plan. * No order type specified *

## 2019-06-28 NOTE — Progress Notes (Signed)
Pain Management Virtual Encounter Note - Virtual Visit via Telephone Telehealth (real-time audio visits between healthcare provider and patient).   Patient's Phone No. & Preferred Pharmacy:  [phone] (home); [phone] (mobile); (Preferred) [phone] [email or web address]  [id number] [name], [facility] [address] [place] [address] Phone: [phone] Fax: [phone]    Pre-screening note:  Our staff contacted Erika [name] and offered her an "in person", "face-to-face" appointment versus a telephone encounter. She indicated preferring the telephone encounter, at this time.   Reason for Virtual Visit: COVID-19*  Social distancing based on CDC and AMA recommendations.   I contacted Erika [name] on 06/29/2019 via telephone.      I clearly identified myself as Gaspar Cola, MD. I verified that I was speaking with the correct person using two identifiers (Name: Erika [name], and date of birth: [birth year]).  Advanced Informed Consent I sought verbal advanced consent from Erika [name] for virtual visit interactions. I informed Erika [name] of possible security and privacy concerns, risks, and limitations associated with providing "not-in-person" medical evaluation and management services. I also informed Erika [name] of the availability of "in-person" appointments. Finally, I informed her that there would be a charge for the virtual visit and that she could be  personally, fully or partially, financially responsible for it. Erika [name] expressed understanding and agreed to proceed.   Historic Elements   Erika [name] is a 48 y.o. year old, female patient evaluated today after her last encounter by our practice on 06/15/2019. Erika [name]  has a past medical history of Arthralgia of temporomandibular joint, CAD, multiple vessel, Carotid arterial disease ([place]), Depression, Diastolic dysfunction, Fatty liver  disease, nonalcoholic (1540), HLD (hyperlipidemia), Labile hypertension, Obesity, PTSD (post-traumatic stress disorder), Tobacco abuse, and Type 2 diabetes mellitus [facility]) (January 2017). She also  has a past surgical history that includes Cholecystectomy; Tonsillectomy; Cesarean section; Cardiac catheterization ([name], 04/18/2016); Cardiac catheterization ([name], 06/29/2016); Coronary artery bypass graft ([name], 07/06/2016); TEE without cardioversion ([name], 07/06/2016); Endarterectomy (Right, 07/06/2016); Cardiac catheterization ([name], 08/29/2016); ABDOMINAL AORTOGRAM W/LOWER EXTREMITY ([name], 10/15/2018); and PERIPHERAL VASCULAR INTERVENTION (Left, 10/15/2018). Erika [name] has a current medication list which includes the following prescription(s): albuterol, alirocumab, amlodipine, aspirin ec, benztropine, carvedilol, chlorpromazine, vitamin d3, clonazepam, clopidogrel, cyclobenzaprine, fluoxetine hcl, gabapentin, insulin glargine, insulin syringe-needle u-100, isosorbide mononitrate, lamotrigine, losartan, magic mouthwash, magnesium, naloxone, nitroglycerin, rosuvastatin, spironolactone, vitamin d (ergocalciferol), calcium carbonate, insulin aspart protamine- aspart, ondansetron, oxycodone, oxycodone, oxycodone, and pregabalin. She  reports that she has been smoking cigarettes. She has a 27.00 pack-year smoking history. She has never used smokeless tobacco. She reports current alcohol use. She reports that she does not use drugs. Erika [name] has No Known Allergies.   HPI  Today, she is being contacted for medication management.  The patient describes having reached 900 mg 4 times daily of gabapentin and she indicates experiencing no benefit from it.  In view of the failure of the gabapentin to have provided the patient with relief of some of the neurogenic pain, we will be discontinuing the medication and doing a Lyrica trial.  The patient was instructed to start going down on the gabapentin by 1 pill/day.  She is currently  using the 300 mg pills and therefore she will go from 12 pills/day to 11, followed by 10, followed by 9, and so on and so forth.  Since she experienced absolutely no problems with the gabapentin and 900 mg 4 times daily, she should be able to tolerate  Lyrica at 50 mg p.o. 3 times daily.  That will be our starting dose.  I will be calling her in approximately 3 weeks to see how she is doing and to continue increasing it as tolerated.  The patient has indicated that she recently went to see an orthopedic surgeon for her hip pain and she was told that she had bursitis.  They also went ahead and gave her a trochanteric bursa injection.  Pharmacotherapy Assessment  Analgesic: Oxycodone IR 5 mg p.o. twice daily MME/day: 15 mg/day.   Monitoring: Pharmacotherapy: No side-effects or adverse reactions reported. [place] PMP: PDMP reviewed during this encounter.       Compliance: No problems identified. Effectiveness: Clinically acceptable. Plan: Refer to "POC".  Pertinent Labs   SAFETY SCREENING Profile Lab Results  Component Value Date   STAPHAUREUS NEGATIVE 06/29/2016   MRSAPCR NEGATIVE 08/29/2016   HIV Non Reactive 05/19/2017   PREGTESTUR NEGATIVE 08/29/2016   Renal Function Lab Results  Component Value Date   BUN 10 02/25/2019   CREATININE 0.62 02/25/2019   BCR 18 01/07/2019   GFRAA >60 02/25/2019   GFRNONAA >60 02/25/2019   Hepatic Function Lab Results  Component Value Date   AST 17 01/07/2019   ALT 46 (H) 01/07/2019   ALBUMIN 4.6 01/07/2019   UDS Summary  Date Value Ref Range Status  01/07/2019 FINAL  Final    Comment:    ==================================================================== TOXASSURE COMP DRUG ANALYSIS,UR ==================================================================== Test                             Result       Flag       Units Drug Present and Declared for Prescription Verification   7-aminoclonazepam              115          EXPECTED   ng/mg creat     7-aminoclonazepam is an expected metabolite of clonazepam. Source    of clonazepam is a scheduled prescription medication.   Hydrocodone                    732          EXPECTED   ng/mg creat   Hydromorphone                  158          EXPECTED   ng/mg creat   Dihydrocodeine                 212          EXPECTED   ng/mg creat   Norhydrocodone                 1339         EXPECTED   ng/mg creat    Sources of hydrocodone include scheduled prescription    medications. Hydromorphone, dihydrocodeine and norhydrocodone are    expected metabolites of hydrocodone. Hydromorphone and    dihydrocodeine are also available as scheduled prescription    medications.   Cyclobenzaprine                PRESENT      EXPECTED   Desmethylcyclobenzaprine       PRESENT      EXPECTED    Desmethylcyclobenzaprine is an expected metabolite of    cyclobenzaprine.   Chlorpromazine  PRESENT      EXPECTED   Acetaminophen                  PRESENT      EXPECTED Drug Absent but Declared for Prescription Verification   Lamotrigine                    Not Detected UNEXPECTED   Fluoxetine                     Not Detected UNEXPECTED   Salicylate                     Not Detected UNEXPECTED    Aspirin, as indicated in the declared medication list, is not    always detected even when used as directed.   Benztropine                    Not Detected UNEXPECTED ==================================================================== Test                      Result    Flag   Units      Ref Range   Creatinine              165              mg/dL      >=20 ==================================================================== Declared Medications:  The flagging and interpretation on this report are based on the  following declared medications.  Unexpected results may arise from  inaccuracies in the declared medications.  **Note: The testing scope of this panel includes these medications:  Benztropine (Cogentin)   Chlorpromazine (Thorazine)  Clonazepam (Klonopin)  Cyclobenzaprine (Fexmid)  Fluoxetine  Hydrocodone (Norco)  Lamotrigine (Lamictal)  **Note: The testing scope of this panel does not include small to  moderate amounts of these reported medications:  Acetaminophen (Norco)  Aspirin (Aspirin 81)  **Note: The testing scope of this panel does not include following  reported medications:  Albuterol  Alirocumab  Carvedilol (Coreg)  Clopidogrel (Plavix)  Insulin  Insulin (Lantus)  Isosorbide (Imdur)  Losartan (Cozaar)  Mouthwash (Magic Mouthwash)  Naloxone (Narcan)  Nitroglycerin (Nitrostat)  Ondansetron (Zofran)  Rosuvastatin (Crestor)  Spironolactone (Aldactone)  Triamcinolone (Kenalog) ==================================================================== For clinical consultation, please call [phone]. ====================================================================    Note: Above Lab results reviewed.  Recent imaging  DG Hip Unilat W OR W/O Pelvis 2-3 Views Left CLINICAL DATA:  Left hip pain  EXAM: DG HIP (WITH OR WITHOUT PELVIS) 2-3V LEFT  COMPARISON:  None.  FINDINGS: Hip joints and SI joints are symmetric and unremarkable. No acute bony abnormality. Specifically, no fracture, subluxation, or dislocation.  IMPRESSION: Negative.  Electronically Signed   By: Rolm Baptise M.D.   On: 05/28/2019 12:14  Assessment  The primary encounter diagnosis was Chronic pain syndrome. Diagnoses of Chronic headaches (Primary Area of Pain) (Right), Chronic jaw pain (Right), History of carotid endarterectomy (Right), Geniculate Neuralgia (Right), Chronic anticoagulation (PLAVIX), Neurogenic pain, and Vitamin D deficiency were also pertinent to this visit.  Plan of Care  I have changed Erika [name]'s oxyCODONE and calcium carbonate. I am also having her start on oxyCODONE, oxyCODONE, and pregabalin. Additionally, I am having her maintain her Insulin Syringe-Needle  U-100, aspirin EC, rosuvastatin, clopidogrel, isosorbide mononitrate, losartan, magic mouthwash, naloxone, Insulin Glargine, nitroGLYCERIN, albuterol, Vitamin D (Ergocalciferol), carvedilol, cyclobenzaprine, gabapentin, spironolactone, Alirocumab, amLODipine, clonazePAM, benztropine, chlorproMAZINE, FLUoxetine HCl, lamoTRIgine, Vitamin D3, and Magnesium.  Pharmacotherapy (Medications Ordered):  Meds ordered this encounter  Medications  . oxyCODONE (OXY IR/ROXICODONE) 5 MG immediate release tablet    Sig: Take 1 tablet (5 mg total) by mouth 2 (two) times daily as needed for severe pain. Must last 30 days.    Dispense:  60 tablet    Refill:  0    Chronic Pain: STOP Act (Not applicable) Fill 1 day early if closed on refill date. Do not fill until: 07/02/2019. To last until: 08/01/2019. Avoid benzodiazepines within 8 hours of opioids  . calcium carbonate (CALCIUM 600) 600 MG TABS tablet    Sig: Take 1 tablet (600 mg total) by mouth 2 (two) times daily with a meal.    Dispense:  180 tablet    Refill:  0    Fill one day early if pharmacy is closed on scheduled refill date. May substitute for generic if available.  . Cholecalciferol (VITAMIN D3) 125 MCG (5000 UT) CAPS    Sig: Take 1 capsule (5,000 Units total) by mouth daily with breakfast. Take along with calcium and magnesium.    Dispense:  90 capsule    Refill:  0    Fill one day early if pharmacy is closed on scheduled refill date. May substitute for generic if available.  . Magnesium 500 MG CAPS    Sig: Take 1 capsule (500 mg total) by mouth 2 (two) times daily at 8 am and 10 pm.    Dispense:  180 capsule    Refill:  0    Fill one day early if pharmacy is closed on scheduled refill date. May substitute for generic if available.  Marland Kitchen oxyCODONE (OXY IR/ROXICODONE) 5 MG immediate release tablet    Sig: Take 1 tablet (5 mg total) by mouth 2 (two) times daily as needed for severe pain. Must last 30 days.    Dispense:  60 tablet    Refill:  0     Chronic Pain: STOP Act (Not applicable) Fill 1 day early if closed on refill date. Do not fill until: 08/01/2019. To last until: 08/31/2019. Avoid benzodiazepines within 8 hours of opioids  . oxyCODONE (OXY IR/ROXICODONE) 5 MG immediate release tablet    Sig: Take 1 tablet (5 mg total) by mouth 2 (two) times daily as needed for severe pain. Must last 30 days.    Dispense:  60 tablet    Refill:  0    Chronic Pain: STOP Act (Not applicable) Fill 1 day early if closed on refill date. Do not fill until: 08/31/2019. To last until: 09/30/2019. Avoid benzodiazepines within 8 hours of opioids  . pregabalin (LYRICA) 50 MG capsule    Sig: Take 1 capsule (50 mg total) by mouth 3 (three) times daily.    Dispense:  90 capsule    Refill:  0    Fill one day early if pharmacy is closed on scheduled refill date. May substitute for generic if available.   Orders:  No orders of the defined types were placed in this encounter.  Follow-up plan:   Return in about 3 weeks (around 07/20/2019) for (VV), E/M (MM) for evaluation on Lyrica trial..      Interventional management options: Considering:   NOTE: PLAVIX ANTICOAGULATION(Stop:7-10 days pre-procedure; Restart:2 hours post-proc.) Diagnostic right sided facial nerve block Diagnostic right-sided geniculate nerve block  Diagnostic right-sided occipital nerve block  Possible right-sided occipital nerveRFA Diagnostic Botox injections   Palliative PRN treatment(s):   Diagnostic Facial (CN VII) nerve block  #4  Diagnostic right-sided facial/Geniculatenerve block #  3under fluoroscopic guidance and IV sedation    Recent Visits Date Type Provider Dept  04/01/19 Office Visit Milinda Pointer, MD [facility]  Showing recent visits within past 90 days and meeting all other requirements   Today's Visits Date Type Provider Dept  06/29/19 Office Visit Milinda Pointer, MD [facility]  Showing today's visits and meeting all other  requirements   Future Appointments No visits were found meeting these conditions.  Showing future appointments within next 90 days and meeting all other requirements   I discussed the assessment and treatment plan with the patient. The patient was provided an opportunity to ask questions and all were answered. The patient agreed with the plan and demonstrated an understanding of the instructions.  Patient advised to call back or seek an in-person evaluation if the symptoms or condition worsens.  Total duration of non-face-to-face encounter: 15 minutes.  Note by: Gaspar Cola, MD Date: 06/29/2019; Time: 9:45 AM  Note: This dictation was prepared with Dragon dictation. Any transcriptional errors that may result from this process are unintentional.  Disclaimer:  * Given the special circumstances of the COVID-19 pandemic, the federal government has announced that the Office for Civil Rights (OCR) will exercise its enforcement discretion and will not impose penalties on physicians using telehealth in the event of noncompliance with regulatory requirements under the [place] (HIPAA) in connection with the good faith provision of telehealth during the IWOEH-21 national public health emergency. ([place])

## 2019-06-29 ENCOUNTER — Other Ambulatory Visit (INDEPENDENT_AMBULATORY_CARE_PROVIDER_SITE_OTHER): Payer: BC Managed Care – PPO

## 2019-06-29 ENCOUNTER — Other Ambulatory Visit: Payer: Self-pay

## 2019-06-29 ENCOUNTER — Ambulatory Visit: Payer: BC Managed Care – PPO | Attending: Nurse Practitioner | Admitting: Pain Medicine

## 2019-06-29 ENCOUNTER — Other Ambulatory Visit: Payer: Self-pay | Admitting: *Deleted

## 2019-06-29 DIAGNOSIS — G894 Chronic pain syndrome: Secondary | ICD-10-CM

## 2019-06-29 DIAGNOSIS — Z7901 Long term (current) use of anticoagulants: Secondary | ICD-10-CM

## 2019-06-29 DIAGNOSIS — R233 Spontaneous ecchymoses: Secondary | ICD-10-CM

## 2019-06-29 DIAGNOSIS — M792 Neuralgia and neuritis, unspecified: Secondary | ICD-10-CM

## 2019-06-29 DIAGNOSIS — R51 Headache: Secondary | ICD-10-CM | POA: Diagnosis not present

## 2019-06-29 DIAGNOSIS — Z9889 Other specified postprocedural states: Secondary | ICD-10-CM

## 2019-06-29 DIAGNOSIS — R238 Other skin changes: Secondary | ICD-10-CM | POA: Diagnosis not present

## 2019-06-29 DIAGNOSIS — E785 Hyperlipidemia, unspecified: Secondary | ICD-10-CM

## 2019-06-29 DIAGNOSIS — R6884 Jaw pain: Secondary | ICD-10-CM

## 2019-06-29 DIAGNOSIS — E559 Vitamin D deficiency, unspecified: Secondary | ICD-10-CM

## 2019-06-29 DIAGNOSIS — I10 Essential (primary) hypertension: Secondary | ICD-10-CM

## 2019-06-29 DIAGNOSIS — E1169 Type 2 diabetes mellitus with other specified complication: Secondary | ICD-10-CM | POA: Diagnosis not present

## 2019-06-29 DIAGNOSIS — B0221 Postherpetic geniculate ganglionitis: Secondary | ICD-10-CM

## 2019-06-29 DIAGNOSIS — G8929 Other chronic pain: Secondary | ICD-10-CM

## 2019-06-29 LAB — CBC WITH DIFFERENTIAL/PLATELET
Basophils Absolute: 0.1 10*3/uL (ref 0.0–0.1)
Basophils Relative: 0.6 % (ref 0.0–3.0)
Eosinophils Absolute: 0.3 10*3/uL (ref 0.0–0.7)
Eosinophils Relative: 2.1 % (ref 0.0–5.0)
HCT: 44.5 % (ref 36.0–46.0)
Hemoglobin: 14.9 g/dL (ref 12.0–15.0)
Lymphocytes Relative: 28.2 % (ref 12.0–46.0)
Lymphs Abs: 3.4 10*3/uL (ref 0.7–4.0)
MCHC: 33.5 g/dL (ref 30.0–36.0)
MCV: 90.7 fl (ref 78.0–100.0)
Monocytes Absolute: 0.6 10*3/uL (ref 0.1–1.0)
Monocytes Relative: 4.6 % (ref 3.0–12.0)
Neutro Abs: 7.8 10*3/uL — ABNORMAL HIGH (ref 1.4–7.7)
Neutrophils Relative %: 64.5 % (ref 43.0–77.0)
Platelets: 358 10*3/uL (ref 150.0–400.0)
RBC: 4.9 Mil/uL (ref 3.87–5.11)
RDW: 14.3 % (ref 11.5–15.5)
WBC: 12.1 10*3/uL — ABNORMAL HIGH (ref 4.0–10.5)

## 2019-06-29 LAB — COMPREHENSIVE METABOLIC PANEL
ALT: 31 U/L (ref 0–35)
AST: 12 U/L (ref 0–37)
Albumin: 4 g/dL (ref 3.5–5.2)
Alkaline Phosphatase: 109 U/L (ref 39–117)
BUN: 11 mg/dL (ref 6–23)
CO2: 26 mEq/L (ref 19–32)
Calcium: 10.1 mg/dL (ref 8.4–10.5)
Chloride: 95 mEq/L — ABNORMAL LOW (ref 96–112)
Creatinine, Ser: 0.66 mg/dL (ref 0.40–1.20)
GFR: 96.6 mL/min (ref >60.00)
Glucose, Bld: 438 mg/dL — ABNORMAL HIGH (ref 70–99)
Potassium: 4.5 mEq/L (ref 3.5–5.1)
Sodium: 131 mEq/L — ABNORMAL LOW (ref 135–145)
Total Bilirubin: 0.2 mg/dL (ref 0.2–1.2)
Total Protein: 6.7 g/dL (ref 6.0–8.3)

## 2019-06-29 LAB — PROTIME-INR
INR: 0.9 ratio (ref 0.8–1.0)
Prothrombin Time: 10.5 s (ref 9.6–13.1)

## 2019-06-29 LAB — LIPID PANEL
Cholesterol: 183 mg/dL (ref 0–200)
HDL: 39 mg/dL — ABNORMAL LOW (ref >39.00)
NonHDL: 144.04
Total CHOL/HDL Ratio: 5
Triglycerides: 338 mg/dL — ABNORMAL HIGH (ref 0.0–149.0)
VLDL: 67.6 mg/dL — ABNORMAL HIGH (ref 0.0–40.0)

## 2019-06-29 LAB — LDL CHOLESTEROL, DIRECT: Direct LDL: 116 mg/dL

## 2019-06-29 LAB — VITAMIN D 25 HYDROXY (VIT D DEFICIENCY, FRACTURES): VITD: 15.4 ng/mL — ABNORMAL LOW (ref 30.00–100.00)

## 2019-06-29 LAB — HEMOGLOBIN A1C: Hgb A1c MFr Bld: 11.9 % — ABNORMAL HIGH (ref 4.6–6.5)

## 2019-06-29 MED ORDER — OXYCODONE HCL 5 MG PO TABS: 5.0000 mg | ORAL_TABLET | Freq: Two times a day (BID) | ORAL | 0 refills | Status: DC | PRN

## 2019-06-29 MED ORDER — LOSARTAN POTASSIUM 100 MG PO TABS: 100.0000 mg | ORAL_TABLET | Freq: Every day | ORAL | 3 refills | Status: DC

## 2019-06-29 MED ORDER — CALCIUM CARBONATE 600 MG PO TABS: 600.0000 mg | ORAL_TABLET | Freq: Two times a day (BID) | ORAL | 0 refills | Status: DC

## 2019-06-29 MED ORDER — MAGNESIUM 500 MG PO CAPS: 500.0000 mg | ORAL_CAPSULE | Freq: Two times a day (BID) | ORAL | 0 refills | Status: DC

## 2019-06-29 MED ORDER — VITAMIN D3 125 MCG (5000 UT) PO CAPS: 1.0000 | ORAL_CAPSULE | Freq: Every day | ORAL | 0 refills | Status: DC

## 2019-06-29 MED ORDER — PREGABALIN 50 MG PO CAPS: 50.0000 mg | ORAL_CAPSULE | Freq: Three times a day (TID) | ORAL | 0 refills | Status: DC

## 2019-06-29 NOTE — Addendum Note (Signed)
Addended by: Lynnea Ferrier on: 06/29/2019 10:40 AM   Modules accepted: Orders

## 2019-06-29 NOTE — Patient Instructions (Signed)
____________________________________________________________________________________________  Medication Rules  Purpose: To inform patients, and their family members, of our rules and regulations.  Applies to: All patients receiving prescriptions (written or electronic).  Pharmacy of record: Pharmacy where electronic prescriptions will be sent. If written prescriptions are taken to a different pharmacy, please inform the nursing staff. The pharmacy listed in the electronic medical record should be the one where you would like electronic prescriptions to be sent.  Electronic prescriptions: In compliance with the Brenda Strengthen Opioid Misuse Prevention (STOP) Act of 2017 (Session Law 2017-74/H243), effective December 24, 2018, all controlled substances must be electronically prescribed. Calling prescriptions to the pharmacy will cease to exist.  Prescription refills: Only during scheduled appointments. Applies to all prescriptions.  NOTE: The following applies primarily to controlled substances (Opioid* Pain Medications).   Patient's responsibilities: 1. Pain Pills: Bring all pain pills to every appointment (except for procedure appointments). 2. Pill Bottles: Bring pills in original pharmacy bottle. Always bring the newest bottle. Bring bottle, even if empty. 3. Medication refills: You are responsible for knowing and keeping track of what medications you take and those you need refilled. The day before your appointment: write a list of all prescriptions that need to be refilled. The day of the appointment: give the list to the admitting nurse. Prescriptions will be written only during appointments. No prescriptions will be written on procedure days. If you forget a medication: it will not be "Called in", "Faxed", or "electronically sent". You will need to get another appointment to get these prescribed. No early refills. Do not call asking to have your prescription filled  early. 4. Prescription Accuracy: You are responsible for carefully inspecting your prescriptions before leaving our office. Have the discharge nurse carefully go over each prescription with you, before taking them home. Make sure that your name is accurately spelled, that your address is correct. Check the name and dose of your medication to make sure it is accurate. Check the number of pills, and the written instructions to make sure they are clear and accurate. Make sure that you are given enough medication to last until your next medication refill appointment. 5. Taking Medication: Take medication as prescribed. When it comes to controlled substances, taking less pills or less frequently than prescribed is permitted and encouraged. Never take more pills than instructed. Never take medication more frequently than prescribed.  6. Inform other Doctors: Always inform, all of your healthcare providers, of all the medications you take. 7. Pain Medication from other Providers: You are not allowed to accept any additional pain medication from any other Doctor or Healthcare provider. There are two exceptions to this rule. (see below) In the event that you require additional pain medication, you are responsible for notifying us, as stated below. 8. Medication Agreement: You are responsible for carefully reading and following our Medication Agreement. This must be signed before receiving any prescriptions from our practice. Safely store a copy of your signed Agreement. Violations to the Agreement will result in no further prescriptions. (Additional copies of our Medication Agreement are available upon request.) 9. Laws, Rules, & Regulations: All patients are expected to follow all Federal and State Laws, Statutes, Rules, & Regulations. Ignorance of the Laws does not constitute a valid excuse. The use of any illegal substances is prohibited. 10. Adopted CDC guidelines & recommendations: Target dosing levels will be  at or below 60 MME/day. Use of benzodiazepines** is not recommended.  Exceptions: There are only two exceptions to the rule of not   receiving pain medications from other Healthcare Providers. 1. Exception #1 (Emergencies): In the event of an emergency (i.e.: accident requiring emergency care), you are allowed to receive additional pain medication. However, you are responsible for: As soon as you are able, call our office [phone], at any time of the day or night, and leave a message stating your name, the date and nature of the emergency, and the name and dose of the medication prescribed. In the event that your call is answered by a member of our staff, make sure to document and save the date, time, and the name of the person that took your information.  2. Exception #2 (Planned Surgery): In the event that you are scheduled by another doctor or dentist to have any type of surgery or procedure, you are allowed (for a period no longer than 30 days), to receive additional pain medication, for the acute post-op pain. However, in this case, you are responsible for picking up a copy of our "Post-op Pain Management for Surgeons" handout, and giving it to your surgeon or dentist. This document is available at our office, and does not require an appointment to obtain it. Simply go to our office during business hours (Monday-Thursday from 8:00 AM to 4:00 PM) (Friday 8:00 AM to 12:00 Noon) or if you have a scheduled appointment with us, prior to your surgery, and ask for it by name. In addition, you will need to provide us with your name, name of your surgeon, type of surgery, and date of procedure or surgery.  *Opioid medications include: morphine, codeine, oxycodone, oxymorphone, hydrocodone, hydromorphone, meperidine, tramadol, tapentadol, buprenorphine, fentanyl, methadone. **Benzodiazepine medications include: diazepam (Valium), alprazolam (Xanax), clonazepam (Klonopine), lorazepam (Ativan), clorazepate  (Tranxene), chlordiazepoxide (Librium), estazolam (Prosom), oxazepam (Serax), temazepam (Restoril), triazolam (Halcion) (Last updated: 02/20/2018) ____________________________________________________________________________________________   ____________________________________________________________________________________________  Medication Recommendations and Reminders  Applies to: All patients receiving prescriptions (written and/or electronic).  Medication Rules & Regulations: These rules and regulations exist for your safety and that of others. They are not flexible and neither are we. Dismissing or ignoring them will be considered "non-compliance" with medication therapy, resulting in complete and irreversible termination of such therapy. (See document titled "Medication Rules" for more details.) In all conscience, because of safety reasons, we cannot continue providing a therapy where the patient does not follow instructions.  Pharmacy of record:   Definition: This is the pharmacy where your electronic prescriptions will be sent.   We do not endorse any particular pharmacy.  You are not restricted in your choice of pharmacy.  The pharmacy listed in the electronic medical record should be the one where you want electronic prescriptions to be sent.  If you choose to change pharmacy, simply notify our nursing staff of your choice of new pharmacy.  Recommendations:  Keep all of your pain medications in a safe place, under lock and key, even if you live alone.   After you fill your prescription, take 1 week's worth of pills and put them away in a safe place. You should keep a separate, properly labeled bottle for this purpose. The remainder should be kept in the original bottle. Use this as your primary supply, until it runs out. Once it's gone, then you know that you have 1 week's worth of medicine, and it is time to come in for a prescription refill. If you do this correctly, it  is unlikely that you will ever run out of medicine.  To make sure that the above recommendation works,   it is very important that you make sure your medication refill appointments are scheduled at least 1 week before you run out of medicine. To do this in an effective manner, make sure that you do not leave the office without scheduling your next medication management appointment. Always ask the nursing staff to show you in your prescription , when your medication will be running out. Then arrange for the receptionist to get you a return appointment, at least 7 days before you run out of medicine. Do not wait until you have 1 or 2 pills left, to come in. This is very poor planning and does not take into consideration that we may need to cancel appointments due to bad weather, sickness, or emergencies affecting our staff.  "Partial Fill": If for any reason your pharmacy does not have enough pills/tablets to completely fill or refill your prescription, do not allow for a "partial fill". You will need a separate prescription to fill the remaining amount, which we will not provide. If the reason for the partial fill is your insurance, you will need to talk to the pharmacist about payment alternatives for the remaining tablets, but again, do not accept a partial fill.  Prescription refills and/or changes in medication(s):   Prescription refills, and/or changes in dose or medication, will be conducted only during scheduled medication management appointments. (Applies to both, written and electronic prescriptions.)  No refills on procedure days. No medication will be changed or started on procedure days. No changes, adjustments, and/or refills will be conducted on a procedure day. Doing so will interfere with the diagnostic portion of the procedure.  No phone refills. No medications will be "called into the pharmacy".  No Fax refills.  No weekend refills.  No Holliday refills.  No after hours  refills.  Remember:  Business hours are:  Monday to Thursday 8:00 AM to 4:00 PM Provider's Schedule: Crystal King, NP - Appointments are:  Medication management: Monday to Thursday 8:00 AM to 4:00 PM Dewanna Hurston, MD - Appointments are:  Medication management: Monday and Wednesday 8:00 AM to 4:00 PM Procedure day: Tuesday and Thursday 7:30 AM to 4:00 PM Bilal Lateef, MD - Appointments are:  Medication management: Tuesday and Thursday 8:00 AM to 4:00 PM Procedure day: Monday and Wednesday 7:30 AM to 4:00 PM (Last update: 02/20/2018) ____________________________________________________________________________________________   

## 2019-06-30 ENCOUNTER — Other Ambulatory Visit: Payer: Self-pay

## 2019-06-30 ENCOUNTER — Encounter: Payer: Self-pay | Admitting: Emergency Medicine

## 2019-06-30 ENCOUNTER — Encounter: Payer: Self-pay | Admitting: Family Medicine

## 2019-06-30 ENCOUNTER — Encounter: Payer: Self-pay | Admitting: Neurology

## 2019-06-30 ENCOUNTER — Emergency Department
Admission: EM | Admit: 2019-06-30 | Discharge: 2019-06-30 | Disposition: A | Discharge status: Home / Self Care | Payer: BC Managed Care – PPO | Attending: Student in an Organized Health Care Education/Training Program | Admitting: Student in an Organized Health Care Education/Training Program

## 2019-06-30 ENCOUNTER — Ambulatory Visit: Payer: BC Managed Care – PPO | Admitting: Neurology

## 2019-06-30 ENCOUNTER — Ambulatory Visit: Payer: Self-pay | Admitting: *Deleted

## 2019-06-30 VITALS — BP 201/102 | HR 89 | Temp 97.7°F | Ht 67.0 in | Wt 213.8 lb

## 2019-06-30 DIAGNOSIS — Z794 Long term (current) use of insulin: Secondary | ICD-10-CM | POA: Diagnosis not present

## 2019-06-30 DIAGNOSIS — I10 Essential (primary) hypertension: Secondary | ICD-10-CM | POA: Insufficient documentation

## 2019-06-30 DIAGNOSIS — Z79899 Other long term (current) drug therapy: Secondary | ICD-10-CM | POA: Insufficient documentation

## 2019-06-30 DIAGNOSIS — F17210 Nicotine dependence, cigarettes, uncomplicated: Secondary | ICD-10-CM | POA: Insufficient documentation

## 2019-06-30 DIAGNOSIS — R29898 Other symptoms and signs involving the musculoskeletal system: Secondary | ICD-10-CM | POA: Diagnosis not present

## 2019-06-30 DIAGNOSIS — G5631 Lesion of radial nerve, right upper limb: Secondary | ICD-10-CM | POA: Diagnosis not present

## 2019-06-30 DIAGNOSIS — Z7902 Long term (current) use of antithrombotics/antiplatelets: Secondary | ICD-10-CM | POA: Diagnosis not present

## 2019-06-30 DIAGNOSIS — E119 Type 2 diabetes mellitus without complications: Secondary | ICD-10-CM | POA: Diagnosis not present

## 2019-06-30 DIAGNOSIS — Z7982 Long term (current) use of aspirin: Secondary | ICD-10-CM | POA: Diagnosis not present

## 2019-06-30 DIAGNOSIS — G894 Chronic pain syndrome: Secondary | ICD-10-CM

## 2019-06-30 DIAGNOSIS — G5630 Lesion of radial nerve, unspecified upper limb: Secondary | ICD-10-CM | POA: Diagnosis not present

## 2019-06-30 DIAGNOSIS — G44021 Chronic cluster headache, intractable: Secondary | ICD-10-CM

## 2019-06-30 DIAGNOSIS — M21331 Wrist drop, right wrist: Secondary | ICD-10-CM | POA: Diagnosis not present

## 2019-06-30 MED ORDER — KETOROLAC TROMETHAMINE 60 MG/2ML IM SOLN
60.0000 mg | Freq: Once | INTRAMUSCULAR | Status: AC
  Administered 2019-06-30: 60 mg via INTRAMUSCULAR

## 2019-06-30 MED ORDER — VITAMIN D3 1.25 MG (50000 UT) PO CAPS: 50000.0000 [IU] | ORAL_CAPSULE | ORAL | 0 refills | Status: DC

## 2019-06-30 NOTE — Discharge Instructions (Signed)
Wear splint as directed until evaluation by neurologist.

## 2019-06-30 NOTE — ED Provider Notes (Signed)
[facility] Provider Note   ____________________________________________   First MD Initiated Contact with Patient 06/30/19 1006     (approximate)  I have reviewed the triage vital signs and the nursing notes.   HISTORY  Chief Complaint Hand Problem    HPI Erika [name] is a 48 y.o. female patient awakened this morning with right wrist drop.  Patient state unable to extend the wrist.  Patient able to move fingers/ elbow, able to pronate and supinate the right upper extremity.  Patient denies pain.  Patient denies paresthesias.  No provocative incident for complaint.  Patient is right-hand dominant.         Past Medical History:  Diagnosis Date  . Arthralgia of temporomandibular joint   . CAD, multiple vessel    a. cath 06/29/16: ostLM 40%, ostLAD 40%, pLAD 95%, ost-pLCx 60%, pLCx 95%, mLCx 60%, mRCA 95%, D2 50%, LVSF nl;  b. 07/2016 CABG x 4 (LIMA->LAD, VG->Diag, VG->OM, VG->RCA); c. 08/2016 Cath: 3VD w/ 4/4 patent grafts. LAD distal to LIMA has diff dzs->Med rx.  . Carotid arterial disease ([place])    a. 07/2016 s/p R CEA.  . Depression   . Diastolic dysfunction    a. echo 06/28/16: EF 50-55%, mild inf wall HK, GR1DD, mild MR, RV sys fxn nl, mildly dilated LA, PASP nl  . Fatty liver disease, nonalcoholic 2229  . HLD (hyperlipidemia)   . Labile hypertension    a. prior renal ngiogram negative for RAS in 03/2016; b. catecholamines and metanephrines normal, mildly elevated renin with normal aldosterone and normal ratio in 02/2016  . Obesity   . PTSD (post-traumatic stress disorder)   . Tobacco abuse    a. 2018 - cut back from 2 ppd to 0.5 ppd.  . Type 2 diabetes mellitus [facility]) January 2017    Patient Active Problem List   Diagnosis Date Noted  . Abnormal bruising 06/25/2019  . Right hip pain 05/28/2019  . History of carotid endarterectomy (Right) 03/04/2019  . Pain medication agreement signed 03/04/2019  . Atypical facial pain  (Right) 01/27/2019  . Chronic ear pain (Right) 01/27/2019  . Chronic jaw pain (Right) 01/27/2019  . Geniculate Neuralgia (Right) 01/27/2019  . Vitamin D deficiency 01/19/2019  . Neurogenic pain 01/19/2019  . Chronic anticoagulation (PLAVIX) 01/19/2019  . Chronic pain syndrome 01/07/2019  . Long term current use of opiate analgesic 01/07/2019  . Long term prescription benzodiazepine use 01/07/2019  . Pharmacologic therapy 01/07/2019  . Disorder of skeletal system 01/07/2019  . Problems influencing health status 01/07/2019  . Chronic headaches (Primary Area of Pain) (Right) 01/07/2019  . Opiate use 09/24/2018  . Edema 07/01/2018  . Myalgia 06/24/2018  . Chronic ankle pain (Bilateral) 12/27/2017  . Tendinopathy of right gluteus medius 12/27/2017  . Tendinopathy of left gluteus medius 12/27/2017  . Bilateral hip pain 12/26/2017  . Other chronic pain 10/17/2017  . Elevated troponin I level 05/21/2017  . Major depressive disorder, recurrent episode, moderate ([place]) 11/19/2016  . Insomnia 10/30/2016  . Hypertension, accelerated with heart disease, without CHF   . Constipation 07/25/2016  . S/P CABG x 4 07/06/2016  . Bradycardia   . CAD (coronary artery disease)   . Carotid stenosis   . CAD in native artery 06/29/2016  . Elevated troponin 06/28/2016  . Essential hypertension, malignant 06/28/2016  . Tobacco abuse 06/28/2016  . Essential hypertension   . Malignant hypertension   . Diabetes ([place])   . Chest pain with high  risk for cardiac etiology 06/27/2016  . NSTEMI (non-ST elevated myocardial infarction) ([place]) 06/27/2016  . Proteinuria 03/15/2016  . Renal artery stenosis ([place]) 03/15/2016  . MDD (major depressive disorder) 10/17/2015  . Agoraphobia with panic attacks 04/25/2015  . HTN (hypertension), malignant 10/20/2013  . Cluster headache 03/20/2012  . HLD (hyperlipidemia) 07/26/2010  . ADJUSTMENT DISORDER WITH MIXED FEATURES 07/26/2010    Past Surgical History:  Procedure  Laterality Date  . ABDOMINAL AORTOGRAM W/LOWER EXTREMITY [name] 10/15/2018   Procedure: ABDOMINAL AORTOGRAM W/LOWER EXTREMITY;  Surgeon: Wellington Hampshire, MD;  Location: [place] [facility];  Service: Cardiovascular;  Laterality: [name];  . CARDIAC CATHETERIZATION [name] 06/29/2016   Procedure: Left Heart Cath and Coronary Angiography;  Surgeon: Minna Merritts, MD;  Location: [place] [facility];  Service: Cardiovascular;  Laterality: [name];  . CARDIAC CATHETERIZATION [name] 08/29/2016   Procedure: Left Heart Cath and Cors/Grafts Angiography;  Surgeon: Wellington Hampshire, MD;  Location: [place] [facility];  Service: Cardiovascular;  Laterality: [name];  . CESAREAN SECTION    . CHOLECYSTECTOMY    . CORONARY ARTERY BYPASS GRAFT [name] 07/06/2016   Procedure: CORONARY ARTERY BYPASS GRAFTING (CABG) x four, using left internal mammary artery and right leg greater saphenous vein harvested endoscopically;  Surgeon: Ivin Poot, MD;  Location: [facility];  Service: Open Heart Surgery;  Laterality: [name];  . ENDARTERECTOMY Right 07/06/2016   Procedure: ENDARTERECTOMY CAROTID;  Surgeon: Rosetta Posner, MD;  Location: [place];  Service: Vascular;  Laterality: Right;  . PERIPHERAL VASCULAR CATHETERIZATION [name] 04/18/2016   Procedure: Renal Angiography;  Surgeon: Wellington Hampshire, MD;  Location: [place] [facility];  Service: Cardiovascular;  Laterality: [name];  . PERIPHERAL VASCULAR INTERVENTION Left 10/15/2018   Procedure: PERIPHERAL VASCULAR INTERVENTION;  Surgeon: Wellington Hampshire, MD;  Location: [place] [facility];  Service: Cardiovascular;  Laterality: Left;  Left superficial femoral  . TEE WITHOUT CARDIOVERSION [name] 07/06/2016   Procedure: TRANSESOPHAGEAL ECHOCARDIOGRAM (TEE);  Surgeon: Ivin Poot, MD;  Location: [place];  Service: Open Heart Surgery;  Laterality: [name];  . TONSILLECTOMY      Prior to Admission medications   Medication Sig Start Date End Date Taking? Authorizing Provider  albuterol (PROVENTIL HFA;VENTOLIN HFA) 108 (90  Base) MCG/ACT inhaler Inhale 2 puffs into the lungs every 6 (six) hours as needed for wheezing. 03/02/19 03/01/20  Lucille Passy, MD  Alirocumab (PRALUENT) 150 MG/ML SOAJ Inject 150 mg into the skin every 14 (fourteen) days. 04/16/19   Wellington Hampshire, MD  amLODipine (NORVASC) 5 MG tablet Take 1 tablet (5 mg total) by mouth daily. 05/01/19 07/30/19  Wellington Hampshire, MD  aspirin EC 81 MG tablet Take 1 tablet (81 mg total) by mouth daily. 07/17/17   Theora Gianotti, NP  benztropine (COGENTIN) 0.5 MG tablet Take 1 tablet (0.5 mg total) by mouth daily. 05/21/19 05/20/20  Arfeen, Arlyce Harman, MD  calcium carbonate (CALCIUM 600) 600 MG TABS tablet Take 1 tablet (600 mg total) by mouth 2 (two) times daily with a meal. 07/02/19 09/30/19  Milinda Pointer, MD  carvedilol (COREG) 25 MG tablet Take 1 tablet (25 mg total) by mouth 2 (two) times daily with a meal. 03/18/19   Wellington Hampshire, MD  chlorproMAZINE (THORAZINE) 50 MG tablet Take 1 tablet (50 mg total) by mouth at bedtime. 05/21/19   Arfeen, Arlyce Harman, MD  Cholecalciferol (VITAMIN D3) 1.25 MG (50000 UT) CAPS Take 50,000 Units by mouth once a week. 06/30/19  Lucille Passy, MD  Cholecalciferol (VITAMIN D3) 125 MCG (5000 UT) CAPS Take 1 capsule (5,000 Units total) by mouth daily with breakfast. Take along with calcium and magnesium. 07/02/19 09/30/19  Milinda Pointer, MD  clonazePAM (KLONOPIN) 0.5 MG tablet Take 1 tablet (0.5 mg total) by mouth 3 (three) times daily as needed for anxiety. 05/21/19   Arfeen, Arlyce Harman, MD  clopidogrel (PLAVIX) 75 MG tablet Take 1 tablet (75 mg total) by mouth daily. 10/15/18 10/15/19  Wellington Hampshire, MD  cyclobenzaprine (FEXMID) 7.5 MG tablet TAKE 1 TABLET BY MOUTH 3 TIMES A DAY AS NEEDED FOR MUSCLE SPASMS 03/30/19   Lucille Passy, MD  FLUoxetine HCl 60 MG TABS Take 60 mg by mouth daily. 05/21/19   Arfeen, Arlyce Harman, MD  gabapentin (NEURONTIN) 300 MG capsule Take 1-3 capsules (300-900 mg total) by mouth 4 (four) times daily. Follow the  written titration schedule. 04/01/19 06/30/19  Milinda Pointer, MD  insulin aspart protamine- aspart (NOVOLOG MIX 70/30) (70-30) 100 UNIT/ML injection UAD 5u bid; 25u prn flow chart 06/25/19   Lucille Passy, MD  Insulin Glargine (LANTUS SOLOSTAR) 100 UNIT/ML Solostar Pen Inject 40 Units into the skin daily. 12/25/18   Lucille Passy, MD  Insulin Syringe-Needle U-100 29G X 1/2" 0.3 ML MISC 1 each by Does not apply route 2 (two) times daily. 05/20/17   Nita Sells, MD  isosorbide mononitrate (IMDUR) 30 MG 24 hr tablet TAKE 1 TABLET (30 MG TOTAL) BY MOUTH DAILY. PLEASE KEEP APPOINTMENT FOR FURTHER REFILLS! THANKS :) Patient taking differently: Take 30 mg by mouth daily.  10/17/18   Wellington Hampshire, MD  lamoTRIgine (LAMICTAL) 200 MG tablet Take 1 tablet (200 mg total) by mouth daily. 05/21/19   Arfeen, Arlyce Harman, MD  losartan (COZAAR) 100 MG tablet Take 1 tablet (100 mg total) by mouth daily. 06/29/19   Wellington Hampshire, MD  magic mouthwash SOLN Take 15 mLs by mouth 3 (three) times daily as needed for mouth pain. 11/10/18   Lucille Passy, MD  Magnesium 500 MG CAPS Take 1 capsule (500 mg total) by mouth 2 (two) times daily at 8 am and 10 pm. 07/02/19 09/30/19  Milinda Pointer, MD  naloxone [facility]) nasal spray 4 mg/0.1 mL 4 mg (contents of 1 nasal spray) as a single dose in one nostril; may repeat every 2 to 3 minutes in alternating nostrils until medical assistance becomes available 12/25/18   Lucille Passy, MD  nitroGLYCERIN (NITROSTAT) 0.4 MG SL tablet PLACE 1 TABLET (0.4 MG TOTAL) UNDER THE TONGUE EVERY 5 (FIVE) MINUTES AS NEEDED FOR CHEST PAIN. 12/30/18   Lucille Passy, MD  ondansetron (ZOFRAN) 4 MG tablet Take 1 tablet (4 mg total) by mouth daily as needed. 06/25/19   Lucille Passy, MD  oxyCODONE (OXY IR/ROXICODONE) 5 MG immediate release tablet Take 1 tablet (5 mg total) by mouth 2 (two) times daily as needed for severe pain. Must last 30 days. 07/02/19 08/01/19  Milinda Pointer, MD  oxyCODONE (OXY  IR/ROXICODONE) 5 MG immediate release tablet Take 1 tablet (5 mg total) by mouth 2 (two) times daily as needed for severe pain. Must last 30 days. 08/01/19 08/31/19  Milinda Pointer, MD  oxyCODONE (OXY IR/ROXICODONE) 5 MG immediate release tablet Take 1 tablet (5 mg total) by mouth 2 (two) times daily as needed for severe pain. Must last 30 days. 08/31/19 09/30/19  Milinda Pointer, MD  pregabalin (LYRICA) 50 MG capsule Take 1 capsule (50 mg  total) by mouth 3 (three) times daily. 06/29/19 07/29/19  Milinda Pointer, MD  rosuvastatin (CRESTOR) 10 MG tablet Take 1 tablet (10 mg total) by mouth daily. 02/26/18   Lucille Passy, MD  spironolactone (ALDACTONE) 25 MG tablet Take 1 tablet (25 mg total) by mouth daily. 04/13/19   Wellington Hampshire, MD  Vitamin D, Ergocalciferol, (DRISDOL) 1.25 MG (50000 UT) CAPS capsule Take 1 capsule (50,000 Units total) by mouth 2 (two) times a week. 03/19/19   Bo Merino, MD    Allergies Patient has no known allergies.  Family History  Adopted: Yes  Problem Relation Age of Onset  . Diabetes Mother   . Diabetes Father   . Alcohol abuse Father   . Heart disease Father   . Drug abuse Father   . Stroke Sister   . Anxiety disorder Sister     Social History Social History   Tobacco Use  . Smoking status: Current Every Day Smoker    Packs/day: 1.00    Years: 27.00    Pack years: 27.00    Types: Cigarettes  . Smokeless tobacco: Never Used  . Tobacco comment: patient smokes when she drives.   Substance Use Topics  . Alcohol use: Yes    Alcohol/week: 0.0 standard drinks    Comment: socially  . Drug use: No    Review of Systems Constitutional: No fever/chills Eyes: No visual changes. ENT: No sore throat. Cardiovascular: Denies chest pain. Respiratory: Denies shortness of breath. Gastrointestinal: No abdominal pain.  No nausea, no vomiting.  No diarrhea.  No constipation. Genitourinary: Negative for dysuria. Musculoskeletal: Negative for back pain.  Skin: Negative for rash. Neurological: Focal weakness right wrist.   Psychiatric:  Depression and PTSD. Endocrine:  Hyperlipidemia and hypertension. ____________________________________________   PHYSICAL EXAM:  VITAL SIGNS: ED Triage Vitals  Enc Vitals Group     BP 06/30/19 0904 (!) 184/88     Pulse Rate 06/30/19 0904 88     Resp 06/30/19 0904 14     Temp 06/30/19 0904 98.7 F (37.1 C)     Temp Source 06/30/19 0904 Oral     SpO2 06/30/19 0904 94 %     Weight --      Height --      Head Circumference --      Peak Flow --      Pain Score 06/30/19 0905 0     Pain Loc --      Pain Edu? --      Excl. in [place]? --     Constitutional: Alert and oriented. Well appearing and in no acute distress. Neck: Full equal range of motion of the cervical spine.  No guarding palpation of spinal processes. Hematological/Lymphatic/Immunilogical: No cervical lymphadenopathy. Cardiovascular: Normal rate, regular rhythm. Grossly normal heart sounds.  Good peripheral circulation.  Elevated blood pressure. Respiratory: Normal respiratory effort.  No retractions. Lungs CTAB. Musculoskeletal: No lower extremity tenderness nor edema.  No joint effusions. Neurologic:  Normal speech and language. Skin:  Skin is warm, dry and intact. No rash noted. Psychiatric: Mood and affect are normal. Speech and behavior are normal.  ____________________________________________   LABS (all labs ordered are listed, but only abnormal results are displayed)  Labs Reviewed - No data to display ____________________________________________  EKG   ____________________________________________  RADIOLOGY  ED MD interpretation:    Official radiology report(s): No results found.  ____________________________________________   PROCEDURES  Procedure(s) performed (including Critical Care):  Procedures   ____________________________________________   INITIAL IMPRESSION /  ASSESSMENT AND PLAN / ED COURSE   As part of my medical decision making, I reviewed the following data within the [place] was evaluated in Emergency Department on 06/30/2019 for the symptoms described in the history of present illness. She was evaluated in the context of the global COVID-19 pandemic, which necessitated consideration that the patient might be at risk for infection with the SARS-CoV-2 virus that causes COVID-19. Institutional protocols and algorithms that pertain to the evaluation of patients at risk for COVID-19 are in a state of rapid change based on information released by regulatory bodies including the CDC and federal and state organizations. These policies and algorithms were followed during the patient's care in the ED.  Patient presents with right wrist drop consistent with radial nerve paralysis.  Patient advised to continue wearing splint and will follow-up with neurology for definitive evaluation and treatment.      ____________________________________________   FINAL CLINICAL IMPRESSION(S) / ED DIAGNOSES  Final diagnoses:  Paralysis due to disorder of right radial nerve     ED Discharge Orders    None       Note:  This document was prepared using Dragon voice recognition software and may include unintentional dictation errors.    Sable Feil, PA-C 06/30/19 1024    Merlyn Lot, MD 06/30/19 1233

## 2019-06-30 NOTE — Progress Notes (Signed)
[facility]  PATIENT: Erika [name] DOB: 1971/04/25  REFERRING DOCTOR OR PCP:  Wilfred Lacy SOURCE: Patient, notes from primary care, imaging and lab reports, CT and MRI scan images personally reviewed  _________________________________   HISTORICAL  CHIEF COMPLAINT:  Chief Complaint  Patient presents with   New Patient (Initial Visit)    RM 12, alone. ED referral for right wrist drop consistent with radial nerve paralysis. Wearing splint. She woke up this morning unable to lift right hand. Was okay yesterday. No pain associated.     HISTORY OF PRESENT ILLNESS:  Erika [name] is a 48 yo woman with right arm weakness.  I had previously seen her for headache.     06/30/2019: She woke up at 3 am this morning with weakness in the right arm but no numbness.    She has had some upper arm pain (near the spiral groove) for several days.   Currently, the pain is only noted when the upper arm is squeezed.    She has a right wrist drop.   She can make a stronger fist with the wrist passively extended compared to a neural position.    She was concerned about a stroke and called her PCP and was told to go to the ED.   She was diagnosed with a radial nerve palsy and referred to [name].       She had no weakness yesterday.   Last night when she went to bed, she felt baseline.     She denies any numbness.     She denies trauma or any pressure/weight on the upper arm or armpit last night.     She does take thorazine,clonazepam and gabapentin at night.   She takes oxycodone 5 mg po bid.  There were no recent changes in medication.    Her BP is elevated and she has been told she has malignant hypertension and is on multiple medications.    She has diabetes that is not currently well controlled (HgbA1c has been 11 and some glucose > 400 recently).     She has PTSD and anxiety and depression.   She is on lamotrigine, thorazine and cogentin  Her cluster headaches are are doing  better.  _________________________________________________________  She is a 48 y.o. woman with daily headaches that started 4 or 5 years ago.  At that time, she began to experience daily headaches that seem to start near the right ear and radiates to the entire right side of her head. She experiences headache 30/30 days for > 4 hours every day.  Initially, she felt the HA's were due to hypertension but they persisted.   She has constant pain that will intensify at times to 10/10 pain with a more shooting quality.     She has been on Norco 7.5 with some benefit --- down to 7/10.     She does not have nausea or vomiting.   She had more pain in bright lights.    She gets some benefit from oxygen therapy and laying down in a dark room.      She is on many medications and has preferred not be on more daily medications.   She has been on oxygen with short term benefit and has been on lamotrigine without benefit for the headache.     She was experiencing a lot of pain in the legs with claudication.  She was found to have severe peripheral vascular disease and has undergone stenting on  the left with improvement.  She has multiple medical conditions and has had severe vascular issues.  Specifically, she has coronary artery disease and had an myocardial infarction requiring CABG.  She has carotid stenosis (she had a string sign on the right and underwent urgent carotid endarterectomy) and peripheral vascular disease (she had a stent placed in the right femoral artery recently and may undergo a procedure on the left.  She also has hypertension with a history of malignant hypertension.  She has insulin-dependent type 2 diabetes.  MRI of the brain dated 12/03/2012.  She has mild chronic microvascular ischemic changes with no acute findings.   I also reviewed the angiogram from 06/30/2016 which showed severe right ICA stenosis and occluded left vertebral artery   REVIEW OF SYSTEMS: Constitutional: No fevers,  chills, sweats, or change in appetite.   She has headaches and insomnia. Eyes: No visual changes, double vision, eye pain Ear, nose and throat: No hearing loss, ear pain, nasal congestion, sore throat Cardiovascular: No chest pain, palpitations.   She has a history of MI and CABG Respiratory: No shortness of breath at rest or with exertion.   No wheezes GastrointestinaI: No nausea, vomiting, diarrhea, abdominal pain, fecal incontinence Genitourinary: No dysuria, urinary retention or frequency.  No nocturia. Musculoskeletal:She has pain in her legs. Integumentary: No rash, pruritus, skin lesions Neurological: as above Psychiatric: She has depression and anxiety Endocrine: She has insulin-dependent diabetes mellitus  hematologic/Lymphatic: No anemia, purpura, petechiae. Allergic/Immunologic: No itchy/runny eyes, nasal congestion, recent allergic reactions, rashes  ALLERGIES: No Known Allergies  HOME MEDICATIONS:  Current Outpatient Medications:    albuterol (PROVENTIL HFA;VENTOLIN HFA) 108 (90 Base) MCG/ACT inhaler, Inhale 2 puffs into the lungs every 6 (six) hours as needed for wheezing., Disp: 1 Inhaler, Rfl: 0   Alirocumab (PRALUENT) 150 MG/ML SOAJ, Inject 150 mg into the skin every 14 (fourteen) days., Disp: 2 pen, Rfl: 11   amLODipine (NORVASC) 5 MG tablet, Take 1 tablet (5 mg total) by mouth daily., Disp: 30 tablet, Rfl: 6   aspirin EC 81 MG tablet, Take 1 tablet (81 mg total) by mouth daily., Disp: 90 tablet, Rfl: 3   benztropine (COGENTIN) 0.5 MG tablet, Take 1 tablet (0.5 mg total) by mouth daily., Disp: 90 tablet, Rfl: 0   [START ON 07/02/2019] calcium carbonate (CALCIUM 600) 600 MG TABS tablet, Take 1 tablet (600 mg total) by mouth 2 (two) times daily with a meal., Disp: 180 tablet, Rfl: 0   carvedilol (COREG) 25 MG tablet, Take 1 tablet (25 mg total) by mouth 2 (two) times daily with a meal., Disp: 60 tablet, Rfl: 5   chlorproMAZINE (THORAZINE) 50 MG tablet, Take 1  tablet (50 mg total) by mouth at bedtime., Disp: 90 tablet, Rfl: 0   Cholecalciferol (VITAMIN D3) 1.25 MG (50000 UT) CAPS, Take 50,000 Units by mouth once a week., Disp: 12 capsule, Rfl: 0   [START ON 07/02/2019] Cholecalciferol (VITAMIN D3) 125 MCG (5000 UT) CAPS, Take 1 capsule (5,000 Units total) by mouth daily with breakfast. Take along with calcium and magnesium., Disp: 90 capsule, Rfl: 0   clonazePAM (KLONOPIN) 0.5 MG tablet, Take 1 tablet (0.5 mg total) by mouth 3 (three) times daily as needed for anxiety., Disp: 90 tablet, Rfl: 2   clopidogrel (PLAVIX) 75 MG tablet, Take 1 tablet (75 mg total) by mouth daily., Disp: 30 tablet, Rfl: 11   cyclobenzaprine (FEXMID) 7.5 MG tablet, TAKE 1 TABLET BY MOUTH 3 TIMES A DAY AS NEEDED FOR MUSCLE  SPASMS, Disp: 90 tablet, Rfl: 5   FLUoxetine HCl 60 MG TABS, Take 60 mg by mouth daily., Disp: 90 tablet, Rfl: 0   gabapentin (NEURONTIN) 300 MG capsule, Take 1-3 capsules (300-900 mg total) by mouth 4 (four) times daily. Follow the written titration schedule., Disp: 360 capsule, Rfl: 2   insulin aspart protamine- aspart (NOVOLOG MIX 70/30) (70-30) 100 UNIT/ML injection, UAD 5u bid; 25u prn flow chart, Disp: 10 mL, Rfl: 11   Insulin Glargine (LANTUS SOLOSTAR) 100 UNIT/ML Solostar Pen, Inject 40 Units into the skin daily., Disp: 5 pen, Rfl: PRN   Insulin Syringe-Needle U-100 29G X 1/2" 0.3 ML MISC, 1 each by Does not apply route 2 (two) times daily., Disp: 30 each, Rfl: 0   isosorbide mononitrate (IMDUR) 30 MG 24 hr tablet, TAKE 1 TABLET (30 MG TOTAL) BY MOUTH DAILY. PLEASE KEEP APPOINTMENT FOR FURTHER REFILLS! THANKS :) (Patient taking differently: Take 30 mg by mouth daily. ), Disp: 90 tablet, Rfl: 3   lamoTRIgine (LAMICTAL) 200 MG tablet, Take 1 tablet (200 mg total) by mouth daily., Disp: 90 tablet, Rfl: 0   losartan (COZAAR) 100 MG tablet, Take 1 tablet (100 mg total) by mouth daily., Disp: 90 tablet, Rfl: 3   magic mouthwash SOLN, Take 15 mLs by  mouth 3 (three) times daily as needed for mouth pain., Disp: 250 mL, Rfl: 1   [START ON 07/02/2019] Magnesium 500 MG CAPS, Take 1 capsule (500 mg total) by mouth 2 (two) times daily at 8 am and 10 pm., Disp: 180 capsule, Rfl: 0   naloxone (NARCAN) nasal spray 4 mg/0.1 mL, 4 mg (contents of 1 nasal spray) as a single dose in one nostril; may repeat every 2 to 3 minutes in alternating nostrils until medical assistance becomes available, Disp: 1 kit, Rfl: 0   nitroGLYCERIN (NITROSTAT) 0.4 MG SL tablet, PLACE 1 TABLET (0.4 MG TOTAL) UNDER THE TONGUE EVERY 5 (FIVE) MINUTES AS NEEDED FOR CHEST PAIN., Disp: 25 tablet, Rfl: PRN   ondansetron (ZOFRAN) 4 MG tablet, Take 1 tablet (4 mg total) by mouth daily as needed., Disp: 20 tablet, Rfl: 5   [START ON 07/02/2019] oxyCODONE (OXY IR/ROXICODONE) 5 MG immediate release tablet, Take 1 tablet (5 mg total) by mouth 2 (two) times daily as needed for severe pain. Must last 30 days., Disp: 60 tablet, Rfl: 0   [START ON 08/01/2019] oxyCODONE (OXY IR/ROXICODONE) 5 MG immediate release tablet, Take 1 tablet (5 mg total) by mouth 2 (two) times daily as needed for severe pain. Must last 30 days., Disp: 60 tablet, Rfl: 0   [START ON 08/31/2019] oxyCODONE (OXY IR/ROXICODONE) 5 MG immediate release tablet, Take 1 tablet (5 mg total) by mouth 2 (two) times daily as needed for severe pain. Must last 30 days., Disp: 60 tablet, Rfl: 0   pregabalin (LYRICA) 50 MG capsule, Take 1 capsule (50 mg total) by mouth 3 (three) times daily., Disp: 90 capsule, Rfl: 0   rosuvastatin (CRESTOR) 10 MG tablet, Take 1 tablet (10 mg total) by mouth daily., Disp: 90 tablet, Rfl: 3   spironolactone (ALDACTONE) 25 MG tablet, Take 1 tablet (25 mg total) by mouth daily., Disp: 90 tablet, Rfl: 3   Vitamin D, Ergocalciferol, (DRISDOL) 1.25 MG (50000 UT) CAPS capsule, Take 1 capsule (50,000 Units total) by mouth 2 (two) times a week., Disp: 24 capsule, Rfl: 0  PAST MEDICAL HISTORY: Past Medical History:    Diagnosis Date   Arthralgia of temporomandibular joint    CAD,  multiple vessel    a. cath 06/29/16: ostLM 40%, ostLAD 40%, pLAD 95%, ost-pLCx 60%, pLCx 95%, mLCx 60%, mRCA 95%, D2 50%, LVSF nl;  b. 07/2016 CABG x 4 (LIMA->LAD, VG->Diag, VG->OM, VG->RCA); c. 08/2016 Cath: 3VD w/ 4/4 patent grafts. LAD distal to LIMA has diff dzs->Med rx.   Carotid arterial disease ([place])    a. 07/2016 s/p R CEA.   Depression    Diastolic dysfunction    a. echo 06/28/16: EF 50-55%, mild inf wall HK, GR1DD, mild MR, RV sys fxn nl, mildly dilated LA, PASP nl   Fatty liver disease, nonalcoholic 0037   HLD (hyperlipidemia)    Labile hypertension    a. prior renal ngiogram negative for RAS in 03/2016; b. catecholamines and metanephrines normal, mildly elevated renin with normal aldosterone and normal ratio in 02/2016   Obesity    PTSD (post-traumatic stress disorder)    Tobacco abuse    a. 2018 - cut back from 2 ppd to 0.5 ppd.   Type 2 diabetes mellitus [facility]) January 2017    PAST SURGICAL HISTORY: Past Surgical History:  Procedure Laterality Date   ABDOMINAL AORTOGRAM W/LOWER EXTREMITY [name] 10/15/2018   Procedure: ABDOMINAL AORTOGRAM W/LOWER EXTREMITY;  Surgeon: Wellington Hampshire, MD;  Location: [facility];  Service: Cardiovascular;  Laterality: [name];   CARDIAC CATHETERIZATION [name] 06/29/2016   Procedure: Left Heart Cath and Coronary Angiography;  Surgeon: Minna Merritts, MD;  Location: [facility];  Service: Cardiovascular;  Laterality: [name];   CARDIAC CATHETERIZATION [name] 08/29/2016   Procedure: Left Heart Cath and Cors/Grafts Angiography;  Surgeon: Wellington Hampshire, MD;  Location: [facility];  Service: Cardiovascular;  Laterality: [name];   CESAREAN SECTION     CHOLECYSTECTOMY     CORONARY ARTERY BYPASS GRAFT [name] 07/06/2016   Procedure: CORONARY ARTERY BYPASS GRAFTING (CABG) x four, using left internal mammary artery and right leg greater saphenous vein harvested endoscopically;   Surgeon: Ivin Poot, MD;  Location: [facility];  Service: Open Heart Surgery;  Laterality: [name];   ENDARTERECTOMY Right 07/06/2016   Procedure: ENDARTERECTOMY CAROTID;  Surgeon: Rosetta Posner, MD;  Location: [facility];  Service: Vascular;  Laterality: Right;   PERIPHERAL VASCULAR CATHETERIZATION [name] 04/18/2016   Procedure: Renal Angiography;  Surgeon: Wellington Hampshire, MD;  Location: [facility];  Service: Cardiovascular;  Laterality: [name];   PERIPHERAL VASCULAR INTERVENTION Left 10/15/2018   Procedure: PERIPHERAL VASCULAR INTERVENTION;  Surgeon: Wellington Hampshire, MD;  Location: [facility];  Service: Cardiovascular;  Laterality: Left;  Left superficial femoral   TEE WITHOUT CARDIOVERSION [name] 07/06/2016   Procedure: TRANSESOPHAGEAL ECHOCARDIOGRAM (TEE);  Surgeon: Ivin Poot, MD;  Location: [facility];  Service: Open Heart Surgery;  Laterality: [name];   TONSILLECTOMY      FAMILY HISTORY: Family History  Adopted: Yes  Problem Relation Age of Onset   Diabetes Mother    Diabetes Father    Alcohol abuse Father    Heart disease Father    Drug abuse Father    Stroke Sister    Anxiety disorder Sister     SOCIAL HISTORY:  Social History   Socioeconomic History   Marital status: Married    Spouse name: Not on file   Number of children: 1   Years of education: 14   Highest education level: Not on file  Occupational History   Occupation: Disability  Social Designer, fashion/clothing strain: Not on file  Food insecurity    Worry: Not on file    Inability: Not on file   Transportation needs    Medical: Not on file    Non-medical: Not on file  Tobacco Use   Smoking status: Current Every Day Smoker    Packs/day: 1.00    Years: 27.00    Pack years: 27.00    Types: Cigarettes   Smokeless tobacco: Never Used   Tobacco comment: patient smokes when she drives.   Substance and Sexual Activity   Alcohol use: Yes    Alcohol/week: 0.0 standard drinks     Comment: socially   Drug use: No   Sexual activity: Yes    Partners: Male    Birth control/protection: None  Lifestyle   Physical activity    Days per week: Not on file    Minutes per session: Not on file   Stress: Not on file  Relationships   Social connections    Talks on phone: Not on file    Gets together: Not on file    Attends religious service: Not on file    Active member of club or organization: Not on file    Attends meetings of clubs or organizations: Not on file    Relationship status: Not on file   Intimate partner violence    Fear of current or ex partner: Not on file    Emotionally abused: Not on file    Physically abused: Not on file    Forced sexual activity: Not on file  Other Topics Concern   Not on file  Social History Narrative   Lives with husband   Caffeine use: Drinks 2 cups coffee per day   Right handed     PHYSICAL EXAM  Vitals:   06/30/19 1520  BP: (!) 201/102  Pulse: 89  Temp: 97.7 F (36.5 C)  Weight: 213 lb 12.8 oz (97 kg)  Height: _0  (1.702 m)    Body mass index is 33.49 kg/m.   General: The patient is well-developed and well-nourished and in no acute distress   Neurologic Exam  Mental status: The patient is alert and oriented x 3 at the time of the examination. The patient has apparent normal recent and remote memory, with an apparently normal attention span and concentration ability.   Speech is normal.  Cranial nerves: Extraocular movements are full. There is good facial sensation to soft touch bilaterally.Facial strength is normal.  Trapezius and sternocleidomastoid strength is normal. No dysarthria is noted.  The tongue is midline, and the patient has symmetric elevation of the soft palate. No obvious hearing deficits are noted.  Motor:  Muscle bulk is normal.   Strength is normal in the triceps but 0/5 in other radial innervated muscles.  Strength was 4+/5 but pain limited in the biceps.  She can make a fist with  her wrist passively extended but not with the wrist flexed  Sensory: Sensory testing is intact to pinprick, soft touch and vibration sensation in arms, hands and knees  Coordination: Cerebellar testing reveals good finger-nose-finger and heel-to-shin bilaterally.  Gait and station: Station is normal.   Gait is normal. Tandem gait is mildly wide. Romberg is negative.   Reflexes: Deep tendon reflexes are symmetric at the biceps and triceps and 2-3 at the knees and trace at the ankles.      DIAGNOSTIC DATA (LABS, IMAGING, TESTING) - I reviewed patient records, labs, notes, testing and imaging myself where available.  Lab Results  Component  Value Date   WBC 12.1 (H) 06/29/2019   HGB 14.9 06/29/2019   HCT 44.5 06/29/2019   MCV 90.7 06/29/2019   PLT 358.0 06/29/2019      Component Value Date/Time   NA 131 (L) 06/29/2019 1040   NA 137 01/07/2019 1148   NA 135 (L) 01/11/2015 1022   K 4.5 06/29/2019 1040   K 3.9 01/11/2015 1022   CL 95 (L) 06/29/2019 1040   CL 100 01/11/2015 1022   CO2 26 06/29/2019 1040   CO2 27 01/11/2015 1022   GLUCOSE 438 (H) 06/29/2019 1040   GLUCOSE 183 (H) 01/11/2015 1022   BUN 11 06/29/2019 1040   BUN 13 01/07/2019 1148   BUN 6 (L) 01/11/2015 1022   CREATININE 0.66 06/29/2019 1040   CREATININE 0.77 01/11/2015 1022   CALCIUM 10.1 06/29/2019 1040   CALCIUM 10.0 01/11/2015 1022   PROT 6.7 06/29/2019 1040   PROT 7.8 01/07/2019 1148   PROT 7.8 01/11/2015 1022   ALBUMIN 4.0 06/29/2019 1040   ALBUMIN 4.6 01/07/2019 1148   ALBUMIN 3.4 01/11/2015 1022   AST 12 06/29/2019 1040   AST 70 (H) 01/11/2015 1022   ALT 31 06/29/2019 1040   ALT 85 (H) 01/11/2015 1022   ALKPHOS 109 06/29/2019 1040   ALKPHOS 91 01/11/2015 1022   BILITOT 0.2 06/29/2019 1040   BILITOT 0.3 01/07/2019 1148   BILITOT 0.4 01/11/2015 1022   GFRNONAA >60 02/25/2019 1056   GFRNONAA >60 01/11/2015 1022   GFRNONAA 41 (L) 08/10/2014 1459   GFRAA >60 02/25/2019 1056   GFRAA >60 01/11/2015  1022   GFRAA 48 (L) 08/10/2014 1459   Lab Results  Component Value Date   CHOL 183 06/29/2019   HDL 39.00 (L) 06/29/2019   LDLCALC UNABLE TO CALCULATE IF TRIGLYCERIDE OVER 400 mg/dL 01/07/2019   LDLDIRECT 116.0 06/29/2019   TRIG 338.0 (H) 06/29/2019   CHOLHDL 5 06/29/2019   Lab Results  Component Value Date   HGBA1C 11.9 (H) 06/29/2019   Lab Results  Component Value Date   VITAMINB12 476 01/07/2019   Lab Results  Component Value Date   TSH 0.89 03/17/2019       ASSESSMENT AND PLAN  1. Neuropathy of right radial nerve   2. Intractable chronic cluster headache   3. Right arm weakness   4. Chronic pain syndrome     1.    Toradol 60 mg x 1.   Due to brittle diabetes, we cannot do steroids.    2.     Wear wrist splint prn 3.     She reports BP reading of 200/100 is common for her.    Check BP at home and call PCP or cardiology if not improved 4.     She appears to have an isolated right radial nerve neuropathy.  As there is no history of unusual positioning or trauma, this is likely related to her diabetes.  If she experiences more weakness (vomiting of the muscles) she needs to call us back and we will do EMG/NCV, otherwise I would expect improvement in 3 months (though 5-6 months may be needed for complete recovery).    5.    I'll do f/u in 4 months to make sure she is improving     Laysha Childers A. Felecia Shelling, MD, [facility] 05/01/4706, 6:15 PM Certified in Neurology, Clinical Neurophysiology, Sleep Medicine, Pain Medicine and Neuroimaging  [facility] [address] [place], [place] [address] [phone]

## 2019-06-30 NOTE — ED Notes (Signed)
See triage note  Presents with decrease use of right wrist   States she woke up at 3 am noticed this denies any injury  Is able to move fingers and elbow  And can turn her arm out ward  Grip is weak  Good pulses

## 2019-06-30 NOTE — Telephone Encounter (Signed)
Pt called stating that when she work this morning 0400, she had  no strength in the right hand unable to flex her wrist or make a fist; recommendations made per nurse triage protocol; she verbalized understanding; the pt sees Dr Deborra Medina, Audrie Lia; will route to office for notification.  Reason for Disposition . [1] Weakness (i.e., paralysis, loss of muscle strength) of the face, arm / hand, or leg / foot on one side of the body AND [2] sudden onset AND [3] present now  Answer Assessment - Initial Assessment Questions 1. SYMPTOM: "What is the main symptom you are concerned about?" (e.g., weakness, numbness)     Right wrist paralyzed 2. ONSET: "When did this start?" (minutes, hours, days; while sleeping)   06/30/2019 at 0400 3. LAST NORMAL: "When was the last time you were normal (no symptoms)?"     06/29/2019 4. PATTERN "Does this come and go, or has it been constant since it started?"  "Is it present now?"     constant 5. CARDIAC SYMPTOMS: "Have you had any of the following symptoms: chest pain, difficulty breathing, palpitations?"     6. NEUROLOGIC SYMPTOMS: "Have you had any of the following symptoms: headache, dizziness, vision loss, double vision, changes in speech, unsteady on your feet?"   no 7. OTHER SYMPTOMS: "Do you have any other symptoms?"  8. PREGNANCY: "Is there any chance you are pregnant?" "When was your last menstrual period?"  Protocols used: NEUROLOGIC DEFICIT-A-AH

## 2019-06-30 NOTE — Telephone Encounter (Signed)
Dr. Armanda Magic pt went to ER.

## 2019-06-30 NOTE — ED Triage Notes (Signed)
Says she woke at 3 am and notices she cannot move her right wrist.  It just flops.  She is able to move fingers, elbow and turn the wrist over.  Just not the wrist.  No numbness or tingling.  No recent injury.

## 2019-07-01 ENCOUNTER — Other Ambulatory Visit: Payer: Self-pay

## 2019-07-01 LAB — IRON,TIBC AND FERRITIN PANEL
%SAT: 30 % (calc) (ref 16–45)
Ferritin: 103 ng/mL (ref 16–232)
Iron: 92 ug/dL (ref 40–190)
TIBC: 308 mcg/dL (calc) (ref 250–450)

## 2019-07-01 MED ORDER — LOSARTAN POTASSIUM 100 MG PO TABS: 100.0000 mg | ORAL_TABLET | Freq: Every day | ORAL | 3 refills | Status: DC

## 2019-07-07 DIAGNOSIS — M1612 Unilateral primary osteoarthritis, left hip: Secondary | ICD-10-CM | POA: Diagnosis not present

## 2019-07-07 DIAGNOSIS — M7062 Trochanteric bursitis, left hip: Secondary | ICD-10-CM | POA: Diagnosis not present

## 2019-07-14 NOTE — Progress Notes (Signed)
Pain Management Virtual Encounter Note - Virtual Visit via Telephone Telehealth (real-time audio visits between healthcare provider and patient).   Patient's Phone No. & Preferred Pharmacy:  [phone] (home); [phone] (mobile); (Preferred) [phone] [email or web address]  [id number] [name], [facility] - [place] [address] [place] [address] Phone: [phone] Fax: [phone]    Pre-screening note:  Our staff contacted Erika [name] and offered her an "in person", "face-to-face" appointment versus a telephone encounter. She indicated preferring the telephone encounter, at this time.   Reason for Virtual Visit: COVID-19*  Social distancing based on CDC and AMA recommendations.   I contacted TAHIRAH SARA on 07/15/2019 via telephone.      I clearly identified myself as Erika Cola, MD. I verified that I was speaking with the correct person using two identifiers (Name: Erika [name], and date of birth: October 10, 1971).  Advanced Informed Consent I sought verbal advanced consent from Andrez Grime for virtual visit interactions. I informed Erika [name] of possible security and privacy concerns, risks, and limitations associated with providing "not-in-person" medical evaluation and management services. I also informed Ms. Rachel of the availability of "in-person" appointments. Finally, I informed her that there would be a charge for the virtual visit and that she could be  personally, fully or partially, financially responsible for it. Erika [name] expressed understanding and agreed to proceed.   Historic Elements   [name] is a 48 y.o. year old, female patient evaluated today after her last encounter by our practice on 06/29/2019. Erika [name]  has a past medical history of Arthralgia of temporomandibular joint, CAD, multiple vessel, Carotid arterial disease ([place]), Depression, Diastolic dysfunction, Fatty liver  disease, nonalcoholic (5188), HLD (hyperlipidemia), Labile hypertension, Obesity, PTSD (post-traumatic stress disorder), Tobacco abuse, and Type 2 diabetes mellitus [facility]) (January 2017). She also  has a past surgical history that includes Cholecystectomy; Tonsillectomy; Cesarean section; Cardiac catheterization ([name], 04/18/2016); Cardiac catheterization ([name], 06/29/2016); Coronary artery bypass graft ([name], 07/06/2016); TEE without cardioversion ([name], 07/06/2016); Endarterectomy (Right, 07/06/2016); Cardiac catheterization ([name], 08/29/2016); ABDOMINAL AORTOGRAM W/LOWER EXTREMITY ([name], 10/15/2018); and PERIPHERAL VASCULAR INTERVENTION (Left, 10/15/2018). [name] has a current medication list which includes the following prescription(s): albuterol, alirocumab, amlodipine, aspirin ec, benztropine, calcium carbonate, carvedilol, chlorpromazine, vitamin d3, vitamin d3, clonazepam, clopidogrel, cyclobenzaprine, fluoxetine hcl, insulin aspart protamine- aspart, insulin glargine, insulin syringe-needle u-100, isosorbide mononitrate, lamotrigine, losartan, magic mouthwash, magnesium, naloxone, nitroglycerin, ondansetron, oxycodone, oxycodone, oxycodone, prednisone, pregabalin, rosuvastatin, spironolactone, and vitamin d (ergocalciferol). She  reports that she has been smoking cigarettes. She has a 27.00 pack-year smoking history. She has never used smokeless tobacco. She reports current alcohol use. She reports that she does not use drugs. Erika [name] has No Known Allergies.   HPI  Today, she is being contacted for medication management. Encounter to evaluate Lyrica trial. By now, she should be off of gabapentin and on her Lyrica. Gabapentin failed to provide relief even at 900 mg PO QID. during today's encounter the patient confirmed that she is already off of the gabapentin.  She has been increasing the Lyrica and at this point she is taking Lyrica 50 mg 1 tablet p.o. 3 times daily.  She denies any side effects or  adverse reactions.  She indicates that at this level it is already as effective as the gabapentin 900 mg p.o. 4 times daily.  In addition, she has noticed that she is not experiencing any of the mental cloudiness or somnolence that she is to get with the  gabapentin.  She is encouraged about this trial and today we will go ahead and increase it further to Lyrica 100 mg p.o. twice daily x14 days, after which we will then increase it to Lyrica 100 mg p.o. 3 times daily for a total of 300 mg/day.  The goal is to increase it by 50 mg every 2 weeks so as to allow her body to get used to the new dosages.  Right now she is doing well at 150 mg/day and the first increase will take it to 200 mg/day.  The patient was instructed to keep her 50 mg pills available just in case when she jumps from the 200 mg/day to 300 mg/day it ends up being too big of a jump in which case then she was instructed to just take the 100 mg twice a day and 1 of the 50 mg and to stay on that for 2 weeks until it is time for her next increase.  I will be calling her back in about 30 days to see how she is doing with this titration.  In addition to this, the patient indicates that approximately 2-1/2 weeks ago she developed a right radial nerve palsy, without any clear evidence of trauma.  She was concerned about the possibility of a stroke and she went to the emergency department to be evaluated.  This is where she was diagnosed with the right radial nerve palsy.  They offered to get her neurology referral, but there was not an appointment available for 3 to 4 weeks.  Because of this she went to her own neurologist who confirmed that she indeed was having a right radial nerve palsy and gave her what she thinks might of been an IM injection of steroids.  She indicates that it does seem to have helped to a certain degree, but she still have the problem.  Today check to see if she could tolerate oral steroids and she indicated that she has diabetes and  she takes insulin, but she has been able to tolerate oral steroids in the past and she is open to this alternative.  She checks her blood sugar regularly and I did warn her about her blood sugar levels going up with the use of the steroids.  She understood and accepted.  We will do a 9-day steroid taper and if this does not help, we may need to ask her neurologist to do a nerve conduction test to see what is going on regarding her radial nerve.  She indicated that her neurologist told her that it was probably due to having fallen asleep with the arm hanging over a chair, which might of caused a neuropraxia.  Pharmacotherapy Assessment  Analgesic: Oxycodone IR 5 mg. 1 tab PO BID (10 mg/day of oxycodone) (enough to last to 09/30/2019) MME/day: 15 mg/day.   Monitoring: Pharmacotherapy: No side-effects or adverse reactions reported. [place] PMP: PDMP reviewed during this encounter.       Compliance: No problems identified. Effectiveness: Clinically acceptable. Plan: Refer to "POC".  Pertinent Labs   SAFETY SCREENING Profile Lab Results  Component Value Date   STAPHAUREUS NEGATIVE 06/29/2016   MRSAPCR NEGATIVE 08/29/2016   HIV Non Reactive 05/19/2017   PREGTESTUR NEGATIVE 08/29/2016   Renal Function Lab Results  Component Value Date   BUN 11 06/29/2019   CREATININE 0.66 06/29/2019   BCR 18 01/07/2019   GFRAA >60 02/25/2019   GFRNONAA >60 02/25/2019   Hepatic Function Lab Results  Component Value Date  AST 12 06/29/2019   ALT 31 06/29/2019   ALBUMIN 4.0 06/29/2019   UDS Summary  Date Value Ref Range Status  01/07/2019 FINAL  Final    Comment:    ==================================================================== TOXASSURE COMP DRUG ANALYSIS,UR ==================================================================== Test                             Result       Flag       Units Drug Present and Declared for Prescription Verification   7-aminoclonazepam              115           EXPECTED   ng/mg creat    7-aminoclonazepam is an expected metabolite of clonazepam. Source    of clonazepam is a scheduled prescription medication.   Hydrocodone                    732          EXPECTED   ng/mg creat   Hydromorphone                  158          EXPECTED   ng/mg creat   Dihydrocodeine                 212          EXPECTED   ng/mg creat   Norhydrocodone                 1339         EXPECTED   ng/mg creat    Sources of hydrocodone include scheduled prescription    medications. Hydromorphone, dihydrocodeine and norhydrocodone are    expected metabolites of hydrocodone. Hydromorphone and    dihydrocodeine are also available as scheduled prescription    medications.   Cyclobenzaprine                PRESENT      EXPECTED   Desmethylcyclobenzaprine       PRESENT      EXPECTED    Desmethylcyclobenzaprine is an expected metabolite of    cyclobenzaprine.   Chlorpromazine                 PRESENT      EXPECTED   Acetaminophen                  PRESENT      EXPECTED Drug Absent but Declared for Prescription Verification   Lamotrigine                    Not Detected UNEXPECTED   Fluoxetine                     Not Detected UNEXPECTED   Salicylate                     Not Detected UNEXPECTED    Aspirin, as indicated in the declared medication list, is not    always detected even when used as directed.   Benztropine                    Not Detected UNEXPECTED ==================================================================== Test                      Result    Flag   Units  Ref Range   Creatinine              165              mg/dL      >=20 ==================================================================== Declared Medications:  The flagging and interpretation on this report are based on the  following declared medications.  Unexpected results may arise from  inaccuracies in the declared medications.  **Note: The testing scope of this panel includes these medications:   Benztropine (Cogentin)  Chlorpromazine (Thorazine)  Clonazepam (Klonopin)  Cyclobenzaprine (Fexmid)  Fluoxetine  Hydrocodone (Norco)  Lamotrigine (Lamictal)  **Note: The testing scope of this panel does not include small to  moderate amounts of these reported medications:  Acetaminophen (Norco)  Aspirin (Aspirin 81)  **Note: The testing scope of this panel does not include following  reported medications:  Albuterol  Alirocumab  Carvedilol (Coreg)  Clopidogrel (Plavix)  Insulin  Insulin (Lantus)  Isosorbide (Imdur)  Losartan (Cozaar)  Mouthwash (Magic Mouthwash)  Naloxone (Narcan)  Nitroglycerin (Nitrostat)  Ondansetron (Zofran)  Rosuvastatin (Crestor)  Spironolactone (Aldactone)  Triamcinolone (Kenalog) ==================================================================== For clinical consultation, please call [phone]. ====================================================================    Note: Above Lab results reviewed.  Recent imaging  DG Hip Unilat W OR W/O Pelvis 2-3 Views Left CLINICAL DATA:  Left hip pain  EXAM: DG HIP (WITH OR WITHOUT PELVIS) 2-3V LEFT  COMPARISON:  None.  FINDINGS: Hip joints and SI joints are symmetric and unremarkable. No acute bony abnormality. Specifically, no fracture, subluxation, or dislocation.  IMPRESSION: Negative.  Electronically Signed   By: Rolm Baptise M.D.   On: 05/28/2019 12:14  Assessment  The primary encounter diagnosis was Radial nerve palsy (Right). Diagnoses of Chronic headaches (Primary Area of Pain) (Right), Chronic pain syndrome, and Neurogenic pain were also pertinent to this visit.  Plan of Care  I have discontinued Jeneva M. Rinkenberger's gabapentin. I have also changed her pregabalin. Additionally, I am having her start on predniSONE. Lastly, I am having her maintain her Insulin Syringe-Needle U-100, aspirin EC, rosuvastatin, clopidogrel, isosorbide mononitrate, magic mouthwash, naloxone, Insulin  Glargine, nitroGLYCERIN, albuterol, Vitamin D (Ergocalciferol), carvedilol, cyclobenzaprine, spironolactone, Alirocumab, amLODipine, clonazePAM, benztropine, chlorproMAZINE, FLUoxetine HCl, lamoTRIgine, ondansetron, insulin aspart protamine- aspart, oxyCODONE, calcium carbonate, Vitamin D3, Magnesium, oxyCODONE, oxyCODONE, Vitamin D3, and losartan.  Pharmacotherapy (Medications Ordered): Meds ordered this encounter  Medications  . pregabalin (LYRICA) 100 MG capsule    Sig: Take 1 capsule (100 mg total) by mouth 2 (two) times daily for 14 days, THEN 1 capsule (100 mg total) 3 (three) times daily for 16 days.    Dispense:  76 capsule    Refill:  0    Fill one day early if pharmacy is closed on scheduled refill date. May substitute for generic if available.  . predniSONE (DELTASONE) 20 MG tablet    Sig: Take 3 tablets (60 mg total) by mouth daily with breakfast for 3 days, THEN 2 tablets (40 mg total) daily with breakfast for 3 days, THEN 1 tablet (20 mg total) daily with breakfast for 3 days.    Dispense:  18 tablet    Refill:  0   Orders:  No orders of the defined types were placed in this encounter.  Follow-up plan:   Return in about 4 weeks (around 08/12/2019) for (VV), E/M (MM) to evaluate and adjust Lyrica titration and to follow-up after oral steroid taper.      Interventional management options: Considering:   NOTE: PLAVIX ANTICOAGULATION(Stop:7-10 days pre-procedure; Restart:2 hours post-proc.) Diagnostic  right sided facial nerve block Diagnostic right-sided geniculate nerve block  Diagnostic right-sided occipital nerve block  Possible right-sided occipital nerveRFA Diagnostic Botox injections   Palliative PRN treatment(s):   Diagnostic Facial (CN VII) nerve block  #4  Diagnostic right-sided facial/Geniculatenerve block #3under fluoroscopic guidance and IV sedation     Recent Visits Date Type Provider Dept  06/29/19 Office Visit Milinda Pointer, MD [facility]  Showing recent visits within past 90 days and meeting all other requirements   Today's Visits Date Type Provider Dept  07/15/19 Office Visit Milinda Pointer, MD [facility]  Showing today's visits and meeting all other requirements   Future Appointments Date Type Provider Dept  09/30/19 Appointment Milinda Pointer, MD [facility]  Showing future appointments within next 90 days and meeting all other requirements   I discussed the assessment and treatment plan with the patient. The patient was provided an opportunity to ask questions and all were answered. The patient agreed with the plan and demonstrated an understanding of the instructions.  Patient advised to call back or seek an in-person evaluation if the symptoms or condition worsens.  Total duration of non-face-to-face encounter: 25 minutes.  Note by: Erika Cola, MD Date: 07/15/2019; Time: 12:55 PM  Note: This dictation was prepared with Dragon dictation. Any transcriptional errors that may result from this process are unintentional.  Disclaimer:  * Given the special circumstances of the COVID-19 pandemic, the federal government has announced that the Office for Civil Rights (OCR) will exercise its enforcement discretion and will not impose penalties on physicians using telehealth in the event of noncompliance with regulatory requirements under the [place] (HIPAA) in connection with the good faith provision of telehealth during the ZOXWR-60 national public health emergency. ([place])

## 2019-07-15 ENCOUNTER — Ambulatory Visit: Payer: BC Managed Care – PPO | Attending: Pain Medicine | Admitting: Pain Medicine

## 2019-07-15 ENCOUNTER — Other Ambulatory Visit: Payer: Self-pay

## 2019-07-15 DIAGNOSIS — R29898 Other symptoms and signs involving the musculoskeletal system: Secondary | ICD-10-CM

## 2019-07-15 DIAGNOSIS — G5631 Lesion of radial nerve, right upper limb: Secondary | ICD-10-CM | POA: Insufficient documentation

## 2019-07-15 DIAGNOSIS — G894 Chronic pain syndrome: Secondary | ICD-10-CM | POA: Diagnosis not present

## 2019-07-15 DIAGNOSIS — M792 Neuralgia and neuritis, unspecified: Secondary | ICD-10-CM

## 2019-07-15 DIAGNOSIS — R519 Headache, unspecified: Secondary | ICD-10-CM

## 2019-07-15 DIAGNOSIS — R51 Headache: Secondary | ICD-10-CM

## 2019-07-15 DIAGNOSIS — G8929 Other chronic pain: Secondary | ICD-10-CM

## 2019-07-15 MED ORDER — PREDNISONE 20 MG PO TABS: ORAL_TABLET | ORAL | 0 refills | Status: AC

## 2019-07-15 MED ORDER — PREGABALIN 100 MG PO CAPS: ORAL_CAPSULE | ORAL | 0 refills | Status: DC

## 2019-07-20 ENCOUNTER — Ambulatory Visit: Payer: BC Managed Care – PPO | Admitting: Pain Medicine

## 2019-07-27 ENCOUNTER — Telehealth: Payer: Self-pay | Admitting: Cardiovascular Disease

## 2019-07-27 NOTE — Telephone Encounter (Signed)
° °  [facility] Pre-operative Risk Assessment    Request for surgical clearance:  1. What type of surgery is being performed? Full mouth extractions/ osseous surgery   2. When is this surgery scheduled? TBD  3. What type of clearance is required (medical clearance vs. Pharmacy clearance to hold med vs. Both)? both  4. Are there any medications that need to be held prior to surgery and how long?any blood thinners need to be advised  5. Practice name and name of physician performing surgery? Des[facility] Maxim at [facility]  6. What is your office phone [phone]   7.   What is your office fax number [phone]  8.   Anesthesia type (None, local, MAC, general) ? Local   Erika [name] 07/27/2019, 2:33 PM  _________________________________________________________________   (provider comments below)

## 2019-07-27 NOTE — Telephone Encounter (Signed)
   Primary Cardiologist:Muhammad Fletcher Anon, MD  Chart reviewed as part of pre-operative protocol coverage. Because of Erika [name]'s past medical history and time since last visit, he/she will require a follow-up visit in order to better assess preoperative cardiovascular risk.  Pre-op covering staff: - Please schedule appointment and call patient to inform them.  HER BP was elevated on last 04/2019 visit, so better to be seen in office for clearance.   - Please contact requesting surgeon's office via preferred method (i.e, phone, fax) to inform them of need for appointment prior to surgery.  If applicable, this message will also be routed to pharmacy pool and/or primary cardiologist for input on holding anticoagulant/antiplatelet agent as requested below so that this information is available at time of patient's appointment.   Cecilie Kicks, NP  07/27/2019, 4:01 PM

## 2019-07-28 NOTE — Telephone Encounter (Signed)
Appt scheduled with Dr.Arida on 09/03/19 @ 9:20am.

## 2019-07-28 NOTE — Telephone Encounter (Signed)
Contacted pt re: appt already scheduled for 09/03/2019 and that her clearance would be addressed at that visit. Pt is requesting a sooner appt. Advised I would send back to Dr. Tyrell Antonio office in [place] to see what they can work out. Pt appreciative for the call back.

## 2019-07-29 NOTE — Telephone Encounter (Signed)
lmov to offer patient virtual visit with [facility] 8/25 .

## 2019-07-30 ENCOUNTER — Other Ambulatory Visit (HOSPITAL_COMMUNITY): Payer: Self-pay | Admitting: Psychiatry

## 2019-07-30 DIAGNOSIS — F4310 Post-traumatic stress disorder, unspecified: Secondary | ICD-10-CM

## 2019-07-31 ENCOUNTER — Ambulatory Visit: Payer: BC Managed Care – PPO | Admitting: Physician Assistant

## 2019-08-04 DIAGNOSIS — M1612 Unilateral primary osteoarthritis, left hip: Secondary | ICD-10-CM | POA: Diagnosis not present

## 2019-08-04 DIAGNOSIS — M7062 Trochanteric bursitis, left hip: Secondary | ICD-10-CM | POA: Diagnosis not present

## 2019-08-05 ENCOUNTER — Other Ambulatory Visit: Payer: Self-pay | Admitting: Orthopedic Surgery

## 2019-08-05 DIAGNOSIS — M7062 Trochanteric bursitis, left hip: Secondary | ICD-10-CM

## 2019-08-11 ENCOUNTER — Encounter: Payer: Self-pay | Admitting: Pain Medicine

## 2019-08-11 NOTE — Progress Notes (Signed)
Pain Management Virtual Encounter Note - Virtual Visit via Telephone Telehealth (real-time audio visits between healthcare provider and patient).   Patient's Phone No. & Preferred Pharmacy:  [phone] (home); [phone] (mobile); (Preferred) [phone] [email or web address]  [id number] [name], [facility] [address] [place] [address] Phone: [phone] Fax: [phone]    Pre-screening note:  Our staff contacted Erika [name] and offered her an "in person", "face-to-face" appointment versus a telephone encounter. She indicated preferring the telephone encounter, at this time.   Reason for Virtual Visit: COVID-19*  Social distancing based on CDC and AMA recommendations.   I contacted [name] on 08/12/2019 via telephone.      I clearly identified myself as Erika Cola, MD. I verified that I was speaking with the correct person using two identifiers (Name: Erika [name], and date of birth: Jun 26, 1971).  Advanced Informed Consent I sought verbal advanced consent from Andrez Grime for virtual visit interactions. I informed Erika [name] of possible security and privacy concerns, risks, and limitations associated with providing "not-in-person" medical evaluation and management services. I also informed Erika [name] of the availability of "in-person" appointments. Finally, I informed her that there would be a charge for the virtual visit and that she could be  personally, fully or partially, financially responsible for it. Erika [name] expressed understanding and agreed to proceed.   Historic Elements   [name] is a 48 y.o. year old, female patient evaluated today after her last encounter by our practice on 07/15/2019. Ms. Granderson  has a past medical history of Arthralgia of temporomandibular joint, CAD, multiple vessel, Carotid arterial disease ([place]), Depression, Diastolic dysfunction, Fatty liver  disease, nonalcoholic (9758), HLD (hyperlipidemia), Labile hypertension, Obesity, PTSD (post-traumatic stress disorder), Tobacco abuse, and Type 2 diabetes mellitus [facility]) (January 2017). She also  has a past surgical history that includes Cholecystectomy; Tonsillectomy; Cesarean section; Cardiac catheterization ([name], 04/18/2016); Cardiac catheterization ([name], 06/29/2016); Coronary artery bypass graft ([name], 07/06/2016); TEE without cardioversion ([name], 07/06/2016); Endarterectomy (Right, 07/06/2016); Cardiac catheterization ([name], 08/29/2016); ABDOMINAL AORTOGRAM W/LOWER EXTREMITY ([name], 10/15/2018); and PERIPHERAL VASCULAR INTERVENTION (Left, 10/15/2018). [name] has a current medication list which includes the following prescription(s): albuterol, alirocumab, aspirin ec, benztropine, calcium carbonate, carvedilol, chlorpromazine, vitamin d3, vitamin d3, clonazepam, clopidogrel, cyclobenzaprine, fluoxetine hcl, insulin aspart protamine- aspart, insulin glargine, insulin syringe-needle u-100, isosorbide mononitrate, lamotrigine, losartan, magic mouthwash, magnesium, naloxone, nitroglycerin, ondansetron, oxycodone, oxycodone, pregabalin, rosuvastatin, spironolactone, vitamin d (ergocalciferol), amlodipine, and oxycodone. She  reports that she has been smoking cigarettes. She has a 27.00 pack-year smoking history. She has never used smokeless tobacco. She reports current alcohol use. She reports that she does not use drugs. Erika [name] has No Known Allergies.   HPI  Today, she is being contacted for medication management.  The patient indicates doing well with the current medication regimen. No adverse reactions or side effects reported to the medications.  The patient indicates that she still not having any type of problems with the Lyrica at 100 mg p.o. 3 times daily.  Today we will bump up the dose to 150 mg p.o. 3 times daily.  She also mentions that the steroid taper provided her with great relief of the symptoms  and she is no longer dropping things like she was doing before.  Her arm pain and numbness has improved significantly.   Today she had an MRI of her hip and she was told that she may need some surgery.  She asked me what  to do in the postop.  With regards to her pain medicine some today I have taken time to explain all that for her.  I have also provided her with information about the process in her after visit summary.  I will follow-up with her again at the beginning of October when she will be needing her next medication refill.  Pharmacotherapy Assessment  Analgesic: Oxycodone IR 5 mg. 1 tab PO BID (10 mg/day of oxycodone) (enough to last to 09/30/2019) MME/day: 15 mg/day.   Monitoring: Pharmacotherapy: No side-effects or adverse reactions reported. [place] PMP: PDMP reviewed during this encounter.       Compliance: No problems identified. Effectiveness: Clinically acceptable. Plan: Refer to "POC".  UDS:  Summary  Date Value Ref Range Status  01/07/2019 FINAL  Final    Comment:    ==================================================================== TOX[facility]RE COMP DRUG ANALYSIS,UR ==================================================================== Test                             Result       Flag       Units Drug Present and Declared for Prescription Verification   7-aminoclonazepam              115          EXPECTED   ng/mg creat    7-aminoclonazepam is an expected metabolite of clonazepam. Source    of clonazepam is a scheduled prescription medication.   Hydrocodone                    732          EXPECTED   ng/mg creat   Hydromorphone                  158          EXPECTED   ng/mg creat   Dihydrocodeine                 212          EXPECTED   ng/mg creat   Norhydrocodone                 1339         EXPECTED   ng/mg creat    Sources of hydrocodone include scheduled prescription    medications. Hydromorphone, dihydrocodeine and norhydrocodone are    expected metabolites of  hydrocodone. Hydromorphone and    dihydrocodeine are also available as scheduled prescription    medications.   Cyclobenzaprine                PRESENT      EXPECTED   Desmethylcyclobenzaprine       PRESENT      EXPECTED    Desmethylcyclobenzaprine is an expected metabolite of    cyclobenzaprine.   Chlorpromazine                 PRESENT      EXPECTED   Acetaminophen                  PRESENT      EXPECTED Drug Absent but Declared for Prescription Verification   Lamotrigine                    Not Detected UNEXPECTED   Fluoxetine                     Not Detected UNEXPECTED   Salicylate  Not Detected UNEXPECTED    Aspirin, as indicated in the declared medication list, is not    always detected even when used as directed.   Benztropine                    Not Detected UNEXPECTED ==================================================================== Test                      Result    Flag   Units      Ref Range   Creatinine              165              mg/dL      >=20 ==================================================================== Declared Medications:  The flagging and interpretation on this report are based on the  following declared medications.  Unexpected results may arise from  inaccuracies in the declared medications.  **Note: The testing scope of this panel includes these medications:  Benztropine (Cogentin)  Chlorpromazine (Thorazine)  Clonazepam (Klonopin)  Cyclobenzaprine (Fexmid)  Fluoxetine  Hydrocodone (Norco)  Lamotrigine (Lamictal)  **Note: The testing scope of this panel does not include small to  moderate amounts of these reported medications:  Acetaminophen (Norco)  Aspirin (Aspirin 81)  **Note: The testing scope of this panel does not include following  reported medications:  Albuterol  Alirocumab  Carvedilol (Coreg)  Clopidogrel (Plavix)  Insulin  Insulin (Lantus)  Isosorbide (Imdur)  Losartan (Cozaar)  Mouthwash (Magic Mouthwash)   Naloxone (Narcan)  Nitroglycerin (Nitrostat)  Ondansetron (Zofran)  Rosuvastatin (Crestor)  Spironolactone (Aldactone)  Triamcinolone (Kenalog) ==================================================================== For clinical consultation, please call [phone]. ====================================================================    Laboratory Chemistry Profile (12 mo)  Renal: 01/07/2019: BUN/Creatinine Ratio 18 06/29/2019: BUN 11; Creatinine, Ser 0.66  Lab Results  Component Value Date   GFRAA >60 02/25/2019   GFRNONAA >60 02/25/2019   Hepatic: 06/29/2019: Albumin 4.0 Lab Results  Component Value Date   AST 12 06/29/2019   ALT 31 06/29/2019   Other: 01/07/2019: 25-Hydroxy, Vitamin D 16; 25-Hydroxy, Vitamin D-2 <1.0; 25-Hydroxy, Vitamin D-3 15; CRP 15; Vitamin B-12 476 03/17/2019: Sed Rate 17 06/29/2019: VITD 15.40 Note: Above Lab results reviewed.  Imaging  Last 90 days:  Dg Hip Unilat W Or W/o Pelvis 2-3 Views Left  Result Date: 05/28/2019 CLINICAL DATA:  Left hip pain EXAM: DG HIP (WITH OR WITHOUT PELVIS) 2-3V LEFT COMPARISON:  None. FINDINGS: Hip joints and SI joints are symmetric and unremarkable. No acute bony abnormality. Specifically, no fracture, subluxation, or dislocation. IMPRESSION: Negative. Electronically Signed   By: Rolm Baptise M.D.   On: 05/28/2019 12:14    Assessment  The primary encounter diagnosis was Chronic pain syndrome. Diagnoses of Chronic headaches (Primary Area of Pain) (Right) and Neurogenic pain were also pertinent to this visit.  Plan of Care  I have changed Naevia M. Manson's pregabalin. I am also having her maintain her Insulin Syringe-Needle U-100, aspirin EC, rosuvastatin, clopidogrel, isosorbide mononitrate, magic mouthwash, naloxone, Insulin Glargine, nitroGLYCERIN, albuterol, Vitamin D (Ergocalciferol), carvedilol, cyclobenzaprine, spironolactone, Alirocumab, amLODipine, clonazePAM, benztropine, chlorproMAZINE, FLUoxetine HCl,  lamoTRIgine, ondansetron, insulin aspart protamine- aspart, oxyCODONE, calcium carbonate, Vitamin D3, Magnesium, oxyCODONE, oxyCODONE, Vitamin D3, and losartan.  Pharmacotherapy (Medications Ordered): Meds ordered this encounter  Medications  . pregabalin (LYRICA) 150 MG capsule    Sig: Take 1 capsule (150 mg total) by mouth 2 (two) times daily for 7 days, THEN 1 capsule (150 mg total) 3 (three) times daily.    Dispense:  263  capsule    Refill:  0    Fill one day early if pharmacy is closed on scheduled refill date. May substitute for generic if available.   Orders:  No orders of the defined types were placed in this encounter.  Follow-up plan:   Return for scheduled appointment.      Interventional management options: Considering:   NOTE: PLAVIX ANTICOAGULATION(Stop:7-10 days pre-procedure; Restart:2 hours post-proc.) Diagnostic right sided facial nerve block Diagnostic right-sided geniculate nerve block  Diagnostic right-sided occipital nerve block  Possible right-sided occipital nerveRFA Diagnostic Botox injections   Palliative PRN treatment(s):   Diagnostic Facial (CN VII) nerve block  #4  Diagnostic right-sided facial/Geniculatenerve block #3under fluoroscopic guidance and IV sedation      Recent Visits Date Type Provider Dept  07/15/19 Office Visit Milinda Pointer, [facility]  06/29/19 Office Visit Milinda Pointer, MD [facility]  Showing recent visits within past 90 days and meeting all other requirements   Today's Visits Date Type Provider Dept  08/12/19 Office Visit Milinda Pointer, MD [facility]  Showing today's visits and meeting all other requirements   Future Appointments Date Type Provider Dept  09/30/19 Appointment Milinda Pointer, MD [facility]  Showing future appointments within next 90 days and meeting all other requirements   I discussed the assessment and treatment plan with the  patient. The patient was provided an opportunity to ask questions and all were answered. The patient agreed with the plan and demonstrated an understanding of the instructions.  Patient advised to call back or seek an in-person evaluation if the symptoms or condition worsens.  Total duration of non-face-to-face encounter: 15 minutes.  Note by: Erika Cola, MD Date: 08/12/2019; Time: 4:52 PM  Note: This dictation was prepared with Dragon dictation. Any transcriptional errors that may result from this process are unintentional.  Disclaimer:  * Given the special circumstances of the COVID-19 pandemic, the federal government has announced that the Office for Civil Rights (OCR) will exercise its enforcement discretion and will not impose penalties on physicians using telehealth in the event of noncompliance with regulatory requirements under the [place] and [place] (HIPAA) in connection with the good faith provision of telehealth during the OLMBE-67 national public health emergency. ([place])

## 2019-08-12 ENCOUNTER — Other Ambulatory Visit: Payer: Self-pay

## 2019-08-12 ENCOUNTER — Ambulatory Visit (HOSPITAL_BASED_OUTPATIENT_CLINIC_OR_DEPARTMENT_OTHER): Payer: BC Managed Care – PPO | Admitting: Pain Medicine

## 2019-08-12 ENCOUNTER — Ambulatory Visit
Admission: RE | Admit: 2019-08-12 | Discharge: 2019-08-12 | Disposition: A | Discharge status: Home / Self Care | Payer: BC Managed Care – PPO | Source: Ambulatory Visit | Attending: Orthopedic Surgery | Admitting: Orthopedic Surgery

## 2019-08-12 DIAGNOSIS — M1612 Unilateral primary osteoarthritis, left hip: Secondary | ICD-10-CM | POA: Diagnosis not present

## 2019-08-12 DIAGNOSIS — M25752 Osteophyte, left hip: Secondary | ICD-10-CM | POA: Diagnosis not present

## 2019-08-12 DIAGNOSIS — M792 Neuralgia and neuritis, unspecified: Secondary | ICD-10-CM

## 2019-08-12 DIAGNOSIS — R51 Headache: Secondary | ICD-10-CM | POA: Insufficient documentation

## 2019-08-12 DIAGNOSIS — M7062 Trochanteric bursitis, left hip: Secondary | ICD-10-CM | POA: Diagnosis not present

## 2019-08-12 DIAGNOSIS — M7602 Gluteal tendinitis, left hip: Secondary | ICD-10-CM | POA: Diagnosis not present

## 2019-08-12 DIAGNOSIS — G894 Chronic pain syndrome: Secondary | ICD-10-CM | POA: Insufficient documentation

## 2019-08-12 DIAGNOSIS — G8929 Other chronic pain: Secondary | ICD-10-CM

## 2019-08-12 DIAGNOSIS — R519 Headache, unspecified: Secondary | ICD-10-CM

## 2019-08-12 MED ORDER — PREGABALIN 150 MG PO CAPS: ORAL_CAPSULE | ORAL | 0 refills | Status: DC

## 2019-08-12 NOTE — Patient Instructions (Signed)
Post-op Pain Management on a Chronic Pain Patient  Why should the surgeon manage his patient's post-op pain? The Surgeon is uniquely qualified to determine the amount of post-operative pain to be expected on a procedure or surgery that he/she has performed. Even with similar surgeries, the surgeon's perspective on expected pain is unique, since he/she performed the procedure and knows the degree of difficulty and/or tissue damage involved in each particular case. The surgeon is also up to date on events such as blood loss, intraoperative complications, and PO (per orum) status that may influence not only the patient's dose and schedule, but route of administration as well.  How about telling chronic pain patients to just double up or increase their usual pain medication intake to compensate for the increased pain? This is a bad idea since it will lead to the patient running out of his/her usual medications early and this may create a problem at the level of the insurance, which supplies medications based on the amount and schedule stated on the prescription. Running out early may trigger an event where the refill is denied by the insurance company and/or pharmacy. In addition, this practice provides a very poor paper trail as to why this patient ran out of medication early. In addition, from the perspective of the pain physician, it creates a nightmare in the accounting of the patient's medication.  So, what should I do as a Psychologist, sport and exercise when confronted with a patient that needs surgery and already takes a significant amount of pain medicine for their chronic pain, which may or may not be related to the surgery I have to perform?  This is what the surgeon should do: 1. Do not change the dose or schedule of the pain medications prescribed by the pain specialist. This medication regimen allows for the patient's chronic pain to be under control, so as to bring that patient down to the level of an average  individual. 2. Have the patient continue their usual pain management regimen, without any alterations. In addition, manage the post-operative pain as you would on any other "narcotic naive" patient. Do not attempt to compensate for tolerance. This is what the patient's usual regimen will do for you. Simply treat the patient as if they had no chronic pain and as if they were taking no other pain medications. 3. Talk to the patient about the medication, just like you would for anyone else. Do not assume that they are experts in opioids. Make sure you let the patient know that the medication is to be used only if absolutely necessary. (PRN) 4. Prescribe the medication for as long as you would on any other patient undergoing the same type of surgery. Prescribe for the same average amount of time that you would on any other patient. Avoid prescribing for longer periods. 5. Send [name] a copy of the operative report with information about your choice of the post-op pain medication provided. 6. Keep [name] informed of any complications that may prolong the average duration patient's post-op pain.  If you have any questions, please feel to contact us at [phone]. _____________________________________________________________________________________________  ____________________________________________________________________________________________  Medication Rules  Purpose: To inform patients, and their family members, of our rules and regulations.  Applies to: All patients receiving prescriptions (written or electronic).  Pharmacy of record: Pharmacy where electronic prescriptions will be sent. If written prescriptions are taken to a different pharmacy, please inform the nursing staff. The pharmacy listed in the electronic medical record should be the one where  you would like electronic prescriptions to be sent.  Electronic prescriptions: In compliance with the [place] (STOP) Act of 2017 (Session Lanny Cramp [phone]), effective December 24, 2018, all controlled substances must be electronically prescribed. Calling prescriptions to the pharmacy will cease to exist.  Prescription refills: Only during scheduled appointments. Applies to all prescriptions.  NOTE: The following applies primarily to controlled substances (Opioid* Pain Medications).   Patient's responsibilities: 1. Pain Pills: Bring all pain pills to every appointment (except for procedure appointments). 2. Pill Bottles: Bring pills in original pharmacy bottle. Always bring the newest bottle. Bring bottle, even if empty. 3. Medication refills: You are responsible for knowing and keeping track of what medications you take and those you need refilled. The day before your appointment: write a list of all prescriptions that need to be refilled. The day of the appointment: give the list to the admitting nurse. Prescriptions will be written only during appointments. No prescriptions will be written on procedure days. If you forget a medication: it will not be "Called in", "Faxed", or "electronically sent". You will need to get another appointment to get these prescribed. No early refills. Do not call asking to have your prescription filled early. 4. Prescription Accuracy: You are responsible for carefully inspecting your prescriptions before leaving our office. Have the discharge nurse carefully go over each prescription with you, before taking them home. Make sure that your name is accurately spelled, that your address is correct. Check the name and dose of your medication to make sure it is accurate. Check the number of pills, and the written instructions to make sure they are clear and accurate. Make sure that you are given enough medication to last until your next medication refill appointment. 5. Taking Medication: Take medication as prescribed. When it comes to controlled substances, taking less pills  or less frequently than prescribed is permitted and encouraged. Never take more pills than instructed. Never take medication more frequently than prescribed.  6. Inform other Doctors: Always inform, all of your healthcare providers, of all the medications you take. 7. Pain Medication from other Providers: You are not allowed to accept any additional pain medication from any other Doctor or Healthcare provider. There are two exceptions to this rule. (see below) In the event that you require additional pain medication, you are responsible for notifying us, as stated below. 8. Medication Agreement: You are responsible for carefully reading and following our Medication Agreement. This must be signed before receiving any prescriptions from our practice. Safely store a copy of your signed Agreement. Violations to the Agreement will result in no further prescriptions. (Additional copies of our Medication Agreement are available upon request.) 9. Laws, Rules, & Regulations: All patients are expected to follow all Federal and [employer], Rules, [employer]. Ignorance of the Laws does not constitute a valid excuse. The use of any illegal substances is prohibited. 10. Adopted CDC guidelines & recommendations: Target dosing levels will be at or below 60 MME/day. Use of benzodiazepines** is not recommended.  Exceptions: There are only two exceptions to the rule of not receiving pain medications from other Healthcare Providers. 1. Exception #1 (Emergencies): In the event of an emergency (i.e.: accident requiring emergency care), you are allowed to receive additional pain medication. However, you are responsible for: As soon as you are able, call our office [phone], at any time of the day or night, and leave a message stating your name, the date and nature of  the emergency, and the name and dose of the medication prescribed. In the event that your call is answered by a member of our staff, make sure to  document and save the date, time, and the name of the person that took your information.  2. Exception #2 (Planned Surgery): In the event that you are scheduled by another doctor or dentist to have any type of surgery or procedure, you are allowed (for a period no longer than 30 days), to receive additional pain medication, for the acute post-op pain. However, in this case, you are responsible for picking up a copy of our "Post-op Pain Management for Surgeons" handout, and giving it to your surgeon or dentist. This document is available at our office, and does not require an appointment to obtain it. Simply go to our office during business hours (Monday-Thursday from 8:00 AM to 4:00 PM) (Friday 8:00 AM to 12:00 Noon) or if you have a scheduled appointment with [name], prior to your surgery, and ask for it by name. In addition, you will need to provide [name] with your name, name of your surgeon, type of surgery, and date of procedure or surgery.  *Opioid medications include: morphine, codeine, oxycodone, oxymorphone, hydrocodone, hydromorphone, meperidine, tramadol, tapentadol, buprenorphine, fentanyl, methadone. **Benzodiazepine medications include: diazepam (Valium), alprazolam (Xanax), clonazepam (Klonopine), lorazepam (Ativan), clorazepate (Tranxene), chlordiazepoxide (Librium), estazolam (Prosom), oxazepam (Serax), temazepam (Restoril), triazolam (Halcion) (Last updated: 02/20/2018) ____________________________________________________________________________________________   ____________________________________________________________________________________________  Medication Recommendations and Reminders  Applies to: All patients receiving prescriptions (written and/or electronic).  Medication Rules & Regulations: These rules and regulations exist for your safety and that of others. They are not flexible and neither are we. Dismissing or ignoring them will be considered "non-compliance" with  medication therapy, resulting in complete and irreversible termination of such therapy. (See document titled "Medication Rules" for more details.) In all conscience, because of safety reasons, we cannot continue providing a therapy where the patient does not follow instructions.  Pharmacy of record:   Definition: This is the pharmacy where your electronic prescriptions will be sent.   We do not endorse any particular pharmacy.  You are not restricted in your choice of pharmacy.  The pharmacy listed in the electronic medical record should be the one where you want electronic prescriptions to be sent.  If you choose to change pharmacy, simply notify our nursing staff of your choice of new pharmacy.  Recommendations:  Keep all of your pain medications in a safe place, under lock and key, even if you live alone.   After you fill your prescription, take 1 week's worth of pills and put them away in a safe place. You should keep a separate, properly labeled bottle for this purpose. The remainder should be kept in the original bottle. Use this as your primary supply, until it runs out. Once it's gone, then you know that you have 1 week's worth of medicine, and it is time to come in for a prescription refill. If you do this correctly, it is unlikely that you will ever run out of medicine.  To make sure that the above recommendation works, it is very important that you make sure your medication refill appointments are scheduled at least 1 week before you run out of medicine. To do this in an effective manner, make sure that you do not leave the office without scheduling your next medication management appointment. Always ask the nursing staff to show you in your prescription , when your medication will be running out.  Then arrange for the receptionist to get you a return appointment, at least 7 days before you run out of medicine. Do not wait until you have 1 or 2 pills left, to come in. This is very poor  planning and does not take into consideration that we may need to cancel appointments due to bad weather, sickness, or emergencies affecting our staff.  "Partial Fill": If for any reason your pharmacy does not have enough pills/tablets to completely fill or refill your prescription, do not allow for a "partial fill". You will need a separate prescription to fill the remaining amount, which we will not provide. If the reason for the partial fill is your insurance, you will need to talk to the pharmacist about payment alternatives for the remaining tablets, but again, do not accept a partial fill.  Prescription refills and/or changes in medication(s):   Prescription refills, and/or changes in dose or medication, will be conducted only during scheduled medication management appointments. (Applies to both, written and electronic prescriptions.)  No refills on procedure days. No medication will be changed or started on procedure days. No changes, adjustments, and/or refills will be conducted on a procedure day. Doing so will interfere with the diagnostic portion of the procedure.  No phone refills. No medications will be "called into the pharmacy".  No Fax refills.  No weekend refills.  No Holliday refills.  No after hours refills.  Remember:  Business hours are:  Monday to Thursday 8:00 AM to 4:00 PM Provider's Schedule: Milinda Pointer, MD - Appointments are:  Medication management: Monday and Wednesday 8:00 AM to 4:00 PM Procedure day: Tuesday and Thursday 7:30 AM to 4:00 PM Gillis Santa, MD - Appointments are:  Medication management: Tuesday and Thursday 8:00 AM to 4:00 PM Procedure day: Monday and Wednesday 7:30 AM to 4:00 PM (Last update: 02/20/2018) ____________________________________________________________________________________________

## 2019-08-13 ENCOUNTER — Ambulatory Visit (INDEPENDENT_AMBULATORY_CARE_PROVIDER_SITE_OTHER): Payer: BC Managed Care – PPO | Admitting: Nurse Practitioner

## 2019-08-13 ENCOUNTER — Ambulatory Visit: Payer: BC Managed Care – PPO | Admitting: Nurse Practitioner

## 2019-08-13 ENCOUNTER — Encounter

## 2019-08-13 ENCOUNTER — Encounter: Payer: Self-pay | Admitting: Nurse Practitioner

## 2019-08-13 VITALS — BP 198/120 | HR 110 | Ht 69.0 in | Wt 209.2 lb

## 2019-08-13 DIAGNOSIS — I739 Peripheral vascular disease, unspecified: Secondary | ICD-10-CM

## 2019-08-13 DIAGNOSIS — I10 Essential (primary) hypertension: Secondary | ICD-10-CM | POA: Diagnosis not present

## 2019-08-13 DIAGNOSIS — E1151 Type 2 diabetes mellitus with diabetic peripheral angiopathy without gangrene: Secondary | ICD-10-CM

## 2019-08-13 DIAGNOSIS — I2510 Atherosclerotic heart disease of native coronary artery without angina pectoris: Secondary | ICD-10-CM | POA: Diagnosis not present

## 2019-08-13 DIAGNOSIS — Z01812 Encounter for preprocedural laboratory examination: Secondary | ICD-10-CM

## 2019-08-13 DIAGNOSIS — E785 Hyperlipidemia, unspecified: Secondary | ICD-10-CM

## 2019-08-13 MED ORDER — AMLODIPINE BESYLATE 10 MG PO TABS: 10.0000 mg | ORAL_TABLET | Freq: Every day | ORAL | 3 refills | Status: DC

## 2019-08-13 NOTE — Progress Notes (Signed)
Office Visit    Patient Name: Erika [name] Date of Encounter: 08/13/2019  Primary Care Provider:  Lucille Passy, MD Primary Cardiologist:  [name], MD  Chief Complaint    48 year old female with a history of CAD status post prior coronary artery bypass grafting, carotid arterial disease status post right carotid endarterectomy, diastolic dysfunction, hypertension, hyperlipidemia, obesity, diabetes, and tobacco abuse, who presents for follow-up related to CAD and preprocedure evaluation.  Past Medical History    Past Medical History:  Diagnosis Date   Arthralgia of temporomandibular joint    CAD, multiple vessel    a. cath 06/29/16: ostLM 40%, ostLAD 40%, pLAD 95%, ost-pLCx 60%, pLCx 95%, mLCx 60%, mRCA 95%, D2 50%, LVSF nl;  b. 07/2016 CABG x 4 (LIMA->LAD, VG->Diag, VG->OM, VG->RCA); c. 08/2016 Cath: 3VD w/ 4/4 patent grafts. LAD distal to LIMA has diff dzs->Med rx.   Carotid arterial disease ([place])    a. 07/2016 s/p R CEA.   Depression    Diastolic dysfunction    a. echo 06/28/16: EF 50-55%, mild inf wall HK, GR1DD, mild MR, RV sys fxn nl, mildly dilated LA, PASP nl   Fatty liver disease, nonalcoholic 6222   HLD (hyperlipidemia)    Labile hypertension    a. prior renal ngiogram negative for RAS in 03/2016; b. catecholamines and metanephrines normal, mildly elevated renin with normal aldosterone and normal ratio in 02/2016   Obesity    PTSD (post-traumatic stress disorder)    Tobacco abuse    a. 2018 - cut back from 2 ppd to 0.5 ppd.   Type 2 diabetes mellitus [facility]) January 2017   Past Surgical History:  Procedure Laterality Date   ABDOMINAL AORTOGRAM W/LOWER EXTREMITY [name] 10/15/2018   Procedure: ABDOMINAL AORTOGRAM W/LOWER EXTREMITY;  Surgeon: Wellington Hampshire, MD;  Location: [place] [facility];  Service: Cardiovascular;  Laterality: [name];   CARDIAC CATHETERIZATION [name] 06/29/2016   Procedure: Left Heart Cath and Coronary Angiography;  Surgeon: Minna Merritts, MD;  Location: [place] [facility];  Service: Cardiovascular;  Laterality: [name];   CARDIAC CATHETERIZATION [name] 08/29/2016   Procedure: Left Heart Cath and Cors/Grafts Angiography;  Surgeon: Wellington Hampshire, MD;  Location: [place] [facility];  Service: Cardiovascular;  Laterality: [name];   CESAREAN SECTION     CHOLECYSTECTOMY     CORONARY ARTERY BYPASS GRAFT [name] 07/06/2016   Procedure: CORONARY ARTERY BYPASS GRAFTING (CABG) x four, using left internal mammary artery and right leg greater saphenous vein harvested endoscopically;  Surgeon: Ivin Poot, MD;  Location: [place];  Service: Open Heart Surgery;  Laterality: [name];   ENDARTERECTOMY Right 07/06/2016   Procedure: ENDARTERECTOMY CAROTID;  Surgeon: Rosetta Posner, MD;  Location: [place];  Service: Vascular;  Laterality: Right;   PERIPHERAL VASCULAR CATHETERIZATION [name] 04/18/2016   Procedure: Renal Angiography;  Surgeon: Wellington Hampshire, MD;  Location: [place] [facility];  Service: Cardiovascular;  Laterality: [name];   PERIPHERAL VASCULAR INTERVENTION Left 10/15/2018   Procedure: PERIPHERAL VASCULAR INTERVENTION;  Surgeon: Wellington Hampshire, MD;  Location: [place] [facility];  Service: Cardiovascular;  Laterality: Left;  Left superficial femoral   TEE WITHOUT CARDIOVERSION [name] 07/06/2016   Procedure: TRANSESOPHAGEAL ECHOCARDIOGRAM (TEE);  Surgeon: Ivin Poot, MD;  Location: [place];  Service: Open Heart Surgery;  Laterality: [name];   TONSILLECTOMY      Allergies  No Known Allergies  History of Present Illness    48 year old female with the above complex past  medical history including CAD status post CABG, carotid arterial disease status post right carotid endarterectomy, diastolic dysfunction, hypertension, hyperlipidemia, obesity, diabetes, and tobacco abuse.  As noted, she suffered a non-STEMI in 2017 and was found to have three-vessel coronary disease.  She subsequently underwent CABG x3 and also right carotid endarterectomy.  In  September 2017, she was rehospitalized with chest pain in the setting of uncontrolled hypertension.  Cath showed patent grafts.  The LAD had diffuse disease distal to the anastomosis and was very small in caliber.  EF was normal with mildly elevated left-ventricular end-diastolic pressure.  She has been medically managed since.    In October 2019, she underwent lower extremity angiography in the setting of bilateral claudication and was found to have significant proximal left SFA stenosis followed by a short occlusion in the mid to distal segment and three-vessel runoff below the knee.  Successful drug-eluting stent placement was performed to the mid and distal left SFA and drug coated balloon angioplasty was performed to the proximal left SFA.  On the right, there was borderline significant mid SFA stenosis.  She was seen via telemedicine visit in May of this year and reported left greater than right bilateral calf claudication.  Noninvasive evaluation performed prior to visit showed a reduction in her ABI to 0.69 on the left with evidence of significant stenosis in the proximal left popliteal artery.  Smoking cessation was advised along with continuation of dual antiplatelet therapy.  It was felt that she may require repeat angiography and endovascular intervention if symptoms do not improve.  Though this visit was originally scheduled as a preoperative evaluation for pending complete dental extraction, she says more importantly, she has had ongoing left lower extremity claudication that occurs after walking about 20 to 30 feet.  And involves the entire leg from the hip to the calf.  She has not had any rest pain.  She has not had any change in hair distribution, discoloration, or skin breakdown.  Symptoms have worsened since May.  She has not had any chest pain or dyspnea and denies PND, orthopnea, dizziness, syncope, edema, or early satiety.  She continues to check her blood pressure at home and says it is  typically in the 696-295 range systolically.  She says this is chronic and relatively unchanged for her despite initiating amlodipine at her last visit.  Home Medications    Prior to Admission medications   Medication Sig Start Date End Date Taking? Authorizing Provider  albuterol (PROVENTIL HFA;VENTOLIN HFA) 108 (90 Base) MCG/ACT inhaler Inhale 2 puffs into the lungs every 6 (six) hours as needed for wheezing. 03/02/19 03/01/20 Yes Lucille Passy, MD  Alirocumab (PRALUENT) 150 MG/ML SOAJ Inject 150 mg into the skin every 14 (fourteen) days. 04/16/19  Yes Wellington Hampshire, MD  amLODipine (NORVASC) 5 MG tablet Take 1 tablet (5 mg total) by mouth daily. 05/01/19 08/13/19 Yes Wellington Hampshire, MD  aspirin EC 81 MG tablet Take 1 tablet (81 mg total) by mouth daily. 07/17/17  Yes Theora Gianotti, NP  benztropine (COGENTIN) 0.5 MG tablet Take 1 tablet (0.5 mg total) by mouth daily. 05/21/19 05/20/20 Yes Arfeen, Arlyce Harman, MD  calcium carbonate (CALCIUM 600) 600 MG TABS tablet Take 1 tablet (600 mg total) by mouth 2 (two) times daily with a meal. 07/02/19 09/30/19 Yes Milinda Pointer, MD  carvedilol (COREG) 25 MG tablet Take 1 tablet (25 mg total) by mouth 2 (two) times daily with a meal. 03/18/19  Yes Arida,  Mertie Clause, MD  chlorproMAZINE (THORAZINE) 50 MG tablet Take 1 tablet (50 mg total) by mouth at bedtime. 05/21/19  Yes Arfeen, Arlyce Harman, MD  Cholecalciferol (VITAMIN D3) 125 MCG (5000 UT) CAPS Take 1 capsule (5,000 Units total) by mouth daily with breakfast. Take along with calcium and magnesium. 07/02/19 09/30/19 Yes Milinda Pointer, MD  clonazePAM (KLONOPIN) 0.5 MG tablet Take 1 tablet (0.5 mg total) by mouth 3 (three) times daily as needed for anxiety. 05/21/19  Yes Arfeen, Arlyce Harman, MD  clopidogrel (PLAVIX) 75 MG tablet Take 1 tablet (75 mg total) by mouth daily. 10/15/18 10/15/19 Yes Wellington Hampshire, MD  cyclobenzaprine (FEXMID) 7.5 MG tablet TAKE 1 TABLET BY MOUTH 3 TIMES A DAY AS NEEDED FOR MUSCLE  SPASMS 03/30/19  Yes Lucille Passy, MD  FLUoxetine HCl 60 MG TABS Take 60 mg by mouth daily. 05/21/19  Yes Arfeen, Arlyce Harman, MD  insulin aspart protamine- aspart (NOVOLOG MIX 70/30) (70-30) 100 UNIT/ML injection UAD 5u bid; 25u prn flow chart 06/25/19  Yes Lucille Passy, MD  Insulin Glargine (LANTUS SOLOSTAR) 100 UNIT/ML Solostar Pen Inject 40 Units into the skin daily. 12/25/18  Yes Lucille Passy, MD  Insulin Syringe-Needle U-100 29G X 1/2" 0.3 ML MISC 1 each by Does not apply route 2 (two) times daily. 05/20/17  Yes Nita Sells, MD  isosorbide mononitrate (IMDUR) 30 MG 24 hr tablet TAKE 1 TABLET (30 MG TOTAL) BY MOUTH DAILY. PLEASE KEEP APPOINTMENT FOR FURTHER REFILLS! THANKS :) Patient taking differently: Take 30 mg by mouth daily.  10/17/18  Yes Wellington Hampshire, MD  lamoTRIgine (LAMICTAL) 200 MG tablet Take 1 tablet (200 mg total) by mouth daily. 05/21/19  Yes Arfeen, Arlyce Harman, MD  losartan (COZAAR) 100 MG tablet Take 1 tablet (100 mg total) by mouth daily. 07/01/19  Yes Wellington Hampshire, MD  magic mouthwash SOLN Take 15 mLs by mouth 3 (three) times daily as needed for mouth pain. 11/10/18  Yes Lucille Passy, MD  Magnesium 500 MG CAPS Take 1 capsule (500 mg total) by mouth 2 (two) times daily at 8 am and 10 pm. 07/02/19 09/30/19 Yes Milinda Pointer, MD  naloxone [facility]) nasal spray 4 mg/0.1 mL 4 mg (contents of 1 nasal spray) as a single dose in one nostril; may repeat every 2 to 3 minutes in alternating nostrils until medical assistance becomes available 12/25/18  Yes Lucille Passy, MD  nitroGLYCERIN (NITROSTAT) 0.4 MG SL tablet PLACE 1 TABLET (0.4 MG TOTAL) UNDER THE TONGUE EVERY 5 (FIVE) MINUTES AS NEEDED FOR CHEST PAIN. 12/30/18  Yes Lucille Passy, MD  ondansetron (ZOFRAN) 4 MG tablet Take 1 tablet (4 mg total) by mouth daily as needed. 06/25/19  Yes Lucille Passy, MD  oxyCODONE (OXY IR/ROXICODONE) 5 MG immediate release tablet Take 1 tablet (5 mg total) by mouth 2 (two) times daily as needed for  severe pain. Must last 30 days. 07/02/19 08/13/19 Yes Milinda Pointer, MD  oxyCODONE (OXY IR/ROXICODONE) 5 MG immediate release tablet Take 1 tablet (5 mg total) by mouth 2 (two) times daily as needed for severe pain. Must last 30 days. 08/01/19 08/31/19 Yes Milinda Pointer, MD  oxyCODONE (OXY IR/ROXICODONE) 5 MG immediate release tablet Take 1 tablet (5 mg total) by mouth 2 (two) times daily as needed for severe pain. Must last 30 days. 08/31/19 09/30/19 Yes Milinda Pointer, MD  pregabalin (LYRICA) 150 MG capsule Take 1 capsule (150 mg total) by mouth 2 (two) times daily  for 7 days, THEN 1 capsule (150 mg total) 3 (three) times daily. Patient taking differently: Take 1 capsule (150 mg total) 3 (three) times daily. 08/12/19 11/10/19 Yes Milinda Pointer, MD  rosuvastatin (CRESTOR) 10 MG tablet Take 1 tablet (10 mg total) by mouth daily. 02/26/18  Yes Lucille Passy, MD  spironolactone (ALDACTONE) 25 MG tablet Take 1 tablet (25 mg total) by mouth daily. 04/13/19  Yes Wellington Hampshire, MD  Cholecalciferol (VITAMIN D3) 1.25 MG (50000 UT) CAPS Take 50,000 Units by mouth once a week. Patient not taking: Reported on 08/13/2019 06/30/19   Lucille Passy, MD   Family History  Adopted: Yes  Problem Relation Age of Onset   Diabetes Mother    Diabetes Father    Alcohol abuse Father    Heart disease Father    Drug abuse Father    Stroke Sister    Anxiety disorder Sister    Social History   Socioeconomic History   Marital status: Married    Spouse name: Not on file   Number of children: 1   Years of education: 14   Highest education level: Not on file  Occupational History   Occupation: Disability  Social Designer, fashion/clothing strain: Not on file   Food insecurity    Worry: Not on file    Inability: Not on file   Transportation needs    Medical: Not on file    Non-medical: Not on file  Tobacco Use   Smoking status: Current Every Day Smoker    Packs/day: 1.00    Years: 27.00     Pack years: 27.00    Types: Cigarettes   Smokeless tobacco: Never Used   Tobacco comment: patient smokes when she drives.   Substance and Sexual Activity   Alcohol use: Yes    Alcohol/week: 0.0 standard drinks    Comment: socially   Drug use: No   Sexual activity: Yes    Partners: Male    Birth control/protection: None  Lifestyle   Physical activity    Days per week: Not on file    Minutes per session: Not on file   Stress: Not on file  Relationships   Social connections    Talks on phone: Not on file    Gets together: Not on file    Attends religious service: Not on file    Active member of club or organization: Not on file    Attends meetings of clubs or organizations: Not on file    Relationship status: Not on file   Intimate partner violence    Fear of current or ex partner: Not on file    Emotionally abused: Not on file    Physically abused: Not on file    Forced sexual activity: Not on file  Other Topics Concern   Not on file  Social History Narrative   Lives with husband   Caffeine use: Drinks 2 cups coffee per day   Right handed     Review of Systems    Progressive left lower extremity claudication as outlined above.  She also has chronic back pain.  She denies chest pain, dyspnea, palpitations, PND, orthopnea, dizziness, syncope, edema, or early satiety.  All other systems reviewed and are otherwise negative except as noted above.  Physical Exam    VS:  BP (!) 198/120 (BP Location: Left Arm, Patient Position: Sitting, Cuff Size: Normal)    Pulse (!) 110    Ht 5\' 9"  (1.753  m)    Wt 209 lb 4 oz (94.9 kg)    BMI 30.90 kg/m  , BMI Body mass index is 30.9 kg/m. Repeat blood pressure was 200/120. GEN: Well nourished, well developed, in no acute distress. HEENT: normal. Neck: Supple, no JVD, carotid bruits, or masses. Cardiac: RRR, no murmurs, rubs, or gallops. No clubbing, cyanosis, edema.  I do not appreciate a left PT.  Left DP is 1+.  Right PT/DP  1+.   Respiratory:  Respirations regular and unlabored, scattered rhonchi. GI: Soft, nontender, nondistended, BS + x 4. MS: no deformity or atrophy. Skin: warm and dry, no rash. Neuro:  Strength and sensation are intact. Psych: Normal affect.  Accessory Clinical Findings    ECG personally reviewed by me today -sinus tachycardia, 110, nonspecific ST and T changes- no acute changes.  Lab Results  Component Value Date   WBC 12.1 (H) 06/29/2019   HGB 14.9 06/29/2019   HCT 44.5 06/29/2019   MCV 90.7 06/29/2019   PLT 358.0 06/29/2019   Lab Results  Component Value Date   CREATININE 0.66 06/29/2019   BUN 11 06/29/2019   NA 131 (L) 06/29/2019   K 4.5 06/29/2019   CL 95 (L) 06/29/2019   CO2 26 06/29/2019   Lab Results  Component Value Date   ALT 31 06/29/2019   AST 12 06/29/2019   ALKPHOS 109 06/29/2019   BILITOT 0.2 06/29/2019   Lab Results  Component Value Date   CHOL 183 06/29/2019   HDL 39.00 (L) 06/29/2019   LDLCALC UNABLE TO CALCULATE IF TRIGLYCERIDE OVER 400 mg/dL 01/07/2019   LDLDIRECT 116.0 06/29/2019   TRIG 338.0 (H) 06/29/2019   CHOLHDL 5 06/29/2019     Assessment & Plan    1.  Peripheral arterial disease with progressive left lower extremity claudication: Prior history of left SFA disease status post drug-eluting stents and drug coated balloon angioplasty in October 2019.  She initially did well following her procedure but when she was seen in May via telemedicine visit, she reported recurrent left lower extremity claudication.  ABIs prior to that visit showed a reduction in ABI to 0.69.  An initial trial of ongoing medical therapy and risk factor modification including aspirin and Plavix and smoking cessation was recommended however, she is continued to smoke just under a pack a day.  Unfortunately, she has had progressive left lower extremity claudication and can now only walk 20 to 30 feet.  She has not had any resting symptoms.  I will follow-up lab work today  and arrange for peripheral angiogram with Dr. Fletcher Anon next week at [facility].  Continue current regimen and titrate amlodipine for ongoing elevated blood pressure.  2.  Malignant hypertension: This has been chronic and persistent with systolics running in the 094B to 200s for a large portion of her adult life.  She was placed on amlodipine 5 mg daily at her last visit and unfortunately her pressure remains elevated today.  I am going to titrate her amlodipine to 10 mg daily and have asked her to call our office on Monday with blood pressure recordings.  She is otherwise on max dose beta-blocker and ARB along with spironolactone 25 mg daily.  I presume that she will need an additional agent and I will plan to add hydralazine 25 mg 3 times daily on Monday if pressures remain greater than 096 systolic.  We did discuss possibly using clonidine instead however, at baseline, she suffers from dry mouth.  3.  Hyperlipidemia:  Direct LDL of 116 in July on Crestor and Praluent.  4.  Coronary artery disease: Status post prior CABG with patent grafts on catheterization in 2017.  She has not been having any chest pain or back pain (anginal equivalent).  She remains on aspirin, Plavix, beta-blocker, ARB, nitrate, statin, and PCSK9 Inhibitor.  5.  Type 2 diabetes mellitus: On insulin and managed by primary care.  A1c was markedly elevated at 11.9 in July.  6.  Ongoing tobacco abuse: Smoking less than half a pack a day.  She recognizes that she needs to quit but is not quite motivated to quit at this point.  Complete cessation advised.  7.  Disposition: Follow-up lab work today in preparation for peripheral angiogram next week.  She is going to call us on Monday with blood pressures from over the weekend and we can consider adding an additional antihypertensive at that time.  Plan to follow-up with Dr. Fletcher Anon approximately 2 to 4 weeks post angiography.   Murray Hodgkins, NP 08/13/2019, 2:37 PM

## 2019-08-13 NOTE — Patient Instructions (Signed)
Medication Instructions:  - Your physician has recommended you make the following change in your medication:   1) Increase norvasc (amlodipine) to 10 mg- take 1 tablet by mouth once daily  If you need a refill on your cardiac medications before your next appointment, please call your pharmacy.   Lab work: - Pre procedure lab work today- BMP/ CBC  - Pre procedure COVID swab- Friday 08/14/19 (12:30 pm- 2:30 pm)  [facility]- drive up testing, DO NOT get out of your car, they will come out to swab you.  If you have labs (blood work) drawn today and your tests are completely normal, you will receive your results only by: [facility] Kitchen MyChart Message (if you have MyChart) OR . A paper copy in the mail If you have any lab test that is abnormal or we need to change your treatment, we will call you to review the results.  Testing/Procedures: -    [address] [place], [place] [address] Dept: [phone] Loc: [place]  08/13/2019  You are scheduled for a Peripheral Angiogram on Wednesday, August 26 with Dr. [name].  1. Please arrive at the [facility] (Main Entrance A) at [facility]: [address] [place], [place] [address] at 10:30 AM (This time is two hours before your procedure to ensure your preparation). Free valet parking service is available.   Special note: Every effort is made to have your procedure done on time. Please understand that emergencies sometimes delay scheduled procedures.  2. Diet: Do not eat solid foods after midnight.  The patient may have clear liquids until 5am upon the day of the procedure.  3. Labs:   - Pre procedure lab work today- BMP/ CBC  - Pre procedure COVID swab- Friday 08/14/19 (12:30 pm- 2:30 pm)  [facility]- drive up testing, DO NOT get out of your car, they will come out to swab you.  4.  Medication instructions in preparation for your procedure:   Contrast Allergy: No  - Take a 1/2 dose of your usual insulin the night before your procedure. - Do not take any insulin the morning of your procedure  On the morning of your procedure, take your Aspirin and any morning medicines NOT listed above.  You may use sips of water.  5. Plan for one night stay--bring personal belongings. 6. Bring a current list of your medications and current insurance cards. 7. You MUST have a responsible person to drive you home. 8. Someone MUST be with you the first 24 hours after you arrive home or your discharge will be delayed. 9. Please wear clothes that are easy to get on and off and wear slip-on shoes.  Thank you for allowing [name] to care for you!   -- [facility]   Follow-Up: At [facility], you and your health needs are our priority.  As part of our continuing mission to provide you with exceptional heart care, we have created designated Provider Care Teams.  These Care Teams include your primary Cardiologist (physician) and Advanced Practice Providers (APPs -  Physician Assistants and Nurse Practitioners) who all work together to provide you with the care you need, when you need it. . in 1 month with Dr. Fletcher Anon  Any Other Special Instructions Will Be Listed Below (If Applicable). - [name]

## 2019-08-13 NOTE — H&P (View-Only) (Signed)
° ° °Office Visit  °  °Patient Name: Erika [name] °Date of Encounter: 08/13/2019 ° °Primary Care Provider:  Aron, Talia M, MD °Primary Cardiologist:  Muhammad Arida, MD ° °Chief Complaint  °  °48-year-old female with a history of CAD status post prior coronary artery bypass grafting, carotid arterial disease status post right carotid endarterectomy, diastolic dysfunction, hypertension, hyperlipidemia, obesity, diabetes, and tobacco abuse, who presents for follow-up related to CAD and preprocedure evaluation. ° °Past Medical History  °  °Past Medical History:  °Diagnosis Date  °• Arthralgia of temporomandibular joint   °• CAD, multiple vessel   ° a. cath 06/29/16: ostLM 40%, ostLAD 40%, pLAD 95%, ost-pLCx 60%, pLCx 95%, mLCx 60%, mRCA 95%, D2 50%, LVSF nl;  b. 07/2016 CABG x 4 (LIMA->LAD, VG->Diag, VG->OM, VG->RCA); c. 08/2016 Cath: 3VD w/ 4/4 patent grafts. LAD distal to LIMA has diff dzs->Med rx.  °• Carotid arterial disease (HCC)   ° a. 07/2016 s/p R CEA.  °• Depression   °• Diastolic dysfunction   ° a. echo 06/28/16: EF 50-55%, mild inf wall HK, GR1DD, mild MR, RV sys fxn nl, mildly dilated LA, PASP nl  °• Fatty liver disease, nonalcoholic 2016  °• HLD (hyperlipidemia)   °• Labile hypertension   ° a. prior renal ngiogram negative for RAS in 03/2016; b. catecholamines and metanephrines normal, mildly elevated renin with normal aldosterone and normal ratio in 02/2016  °• Obesity   °• PTSD (post-traumatic stress disorder)   °• Tobacco abuse   ° a. 2018 - cut back from 2 ppd to 0.5 ppd.  °• Type 2 diabetes mellitus (HCC) January 2017  ° °Past Surgical History:  °Procedure Laterality Date  °• ABDOMINAL AORTOGRAM W/LOWER EXTREMITY [name] 10/15/2018  ° Procedure: ABDOMINAL AORTOGRAM W/LOWER EXTREMITY;  Surgeon: Arida, Muhammad A, MD;  Location: [facility];  Service: Cardiovascular;  Laterality: [name];  °• CARDIAC CATHETERIZATION [name] 06/29/2016  ° Procedure: Left Heart Cath and Coronary Angiography;  Surgeon: Timothy J  Gollan, MD;  Location: [facility];  Service: Cardiovascular;  Laterality: [name];  °• CARDIAC CATHETERIZATION [name] 08/29/2016  ° Procedure: Left Heart Cath and Cors/Grafts Angiography;  Surgeon: Muhammad A Arida, MD;  Location: [facility];  Service: Cardiovascular;  Laterality: [name];  °• CESAREAN SECTION    °• CHOLECYSTECTOMY    °• CORONARY ARTERY BYPASS GRAFT [name] 07/06/2016  ° Procedure: CORONARY ARTERY BYPASS GRAFTING (CABG) x four, using left internal mammary artery and right leg greater saphenous vein harvested endoscopically;  Surgeon: Peter Van Trigt, MD;  Location: [facility];  Service: Open Heart Surgery;  Laterality: [name];  °• ENDARTERECTOMY Right 07/06/2016  ° Procedure: ENDARTERECTOMY CAROTID;  Surgeon: Todd F Early, MD;  Location: [facility];  Service: Vascular;  Laterality: Right;  °• PERIPHERAL VASCULAR CATHETERIZATION [name] 04/18/2016  ° Procedure: Renal Angiography;  Surgeon: Muhammad A Arida, MD;  Location: [facility];  Service: Cardiovascular;  Laterality: [name];  °• PERIPHERAL VASCULAR INTERVENTION Left 10/15/2018  ° Procedure: PERIPHERAL VASCULAR INTERVENTION;  Surgeon: Arida, Muhammad A, MD;  Location: [facility];  Service: Cardiovascular;  Laterality: Left;  Left superficial femoral  °• TEE WITHOUT CARDIOVERSION [name] 07/06/2016  ° Procedure: TRANSESOPHAGEAL ECHOCARDIOGRAM (TEE);  Surgeon: Peter Van Trigt, MD;  Location: [facility];  Service: Open Heart Surgery;  Laterality: [name];  °• TONSILLECTOMY    ° ° °Allergies ° °No Known Allergies ° °History of Present Illness  °  °48-year-old female with the above complex past   medical history including CAD status post CABG, carotid arterial disease status post right carotid endarterectomy, diastolic dysfunction, hypertension, hyperlipidemia, obesity, diabetes, and tobacco abuse.  As noted, she suffered a non-STEMI in 2017 and was found to have three-vessel coronary disease.  She subsequently underwent CABG x3 and also right carotid endarterectomy.  In  September 2017, she was rehospitalized with chest pain in the setting of uncontrolled hypertension.  Cath showed patent grafts.  The LAD had diffuse disease distal to the anastomosis and was very small in caliber.  EF was normal with mildly elevated left-ventricular end-diastolic pressure.  She has been medically managed since.   ° °In October 2019, she underwent lower extremity angiography in the setting of bilateral claudication and was found to have significant proximal left SFA stenosis followed by a short occlusion in the mid to distal segment and three-vessel runoff below the knee.  Successful drug-eluting stent placement was performed to the mid and distal left SFA and drug coated balloon angioplasty was performed to the proximal left SFA.  On the right, there was borderline significant mid SFA stenosis. ° °She was seen via telemedicine visit in May of this year and reported left greater than right bilateral calf claudication.  Noninvasive evaluation performed prior to visit showed a reduction in her ABI to 0.69 on the left with evidence of significant stenosis in the proximal left popliteal artery.  Smoking cessation was advised along with continuation of dual antiplatelet therapy.  It was felt that she may require repeat angiography and endovascular intervention if symptoms do not improve. ° °Though this visit was originally scheduled as a preoperative evaluation for pending complete dental extraction, she says more importantly, she has had ongoing left lower extremity claudication that occurs after walking about 20 to 30 feet.  And involves the entire leg from the hip to the calf.  She has not had any rest pain.  She has not had any change in hair distribution, discoloration, or skin breakdown.  Symptoms have worsened since May.  She has not had any chest pain or dyspnea and denies PND, orthopnea, dizziness, syncope, edema, or early satiety.  She continues to check her blood pressure at home and says it is  typically in the 190-200 range systolically.  She says this is chronic and relatively unchanged for her despite initiating amlodipine at her last visit. ° °Home Medications  °  °Prior to Admission medications   °Medication Sig Start Date End Date Taking? Authorizing Provider  °albuterol (PROVENTIL HFA;VENTOLIN HFA) 108 (90 Base) MCG/ACT inhaler Inhale 2 puffs into the lungs every 6 (six) hours as needed for wheezing. 03/02/19 03/01/20 Yes Aron, Talia M, MD  °Alirocumab (PRALUENT) 150 MG/ML SOAJ Inject 150 mg into the skin every 14 (fourteen) days. 04/16/19  Yes Arida, Muhammad A, MD  °amLODipine (NORVASC) 5 MG tablet Take 1 tablet (5 mg total) by mouth daily. 05/01/19 08/13/19 Yes Arida, Muhammad A, MD  °aspirin EC 81 MG tablet Take 1 tablet (81 mg total) by mouth daily. 07/17/17  Yes Bryer Gottsch Ronald, NP  °benztropine (COGENTIN) 0.5 MG tablet Take 1 tablet (0.5 mg total) by mouth daily. 05/21/19 05/20/20 Yes Arfeen, Syed T, MD  °calcium carbonate (CALCIUM 600) 600 MG TABS tablet Take 1 tablet (600 mg total) by mouth 2 (two) times daily with a meal. 07/02/19 09/30/19 Yes Naveira, Francisco, MD  °carvedilol (COREG) 25 MG tablet Take 1 tablet (25 mg total) by mouth 2 (two) times daily with a meal. 03/18/19  Yes Arida,   Muhammad A, MD  °chlorproMAZINE (THORAZINE) 50 MG tablet Take 1 tablet (50 mg total) by mouth at bedtime. 05/21/19  Yes Arfeen, Syed T, MD  °Cholecalciferol (VITAMIN D3) 125 MCG (5000 UT) CAPS Take 1 capsule (5,000 Units total) by mouth daily with breakfast. Take along with calcium and magnesium. 07/02/19 09/30/19 Yes Naveira, Francisco, MD  °clonazePAM (KLONOPIN) 0.5 MG tablet Take 1 tablet (0.5 mg total) by mouth 3 (three) times daily as needed for anxiety. 05/21/19  Yes Arfeen, Syed T, MD  °clopidogrel (PLAVIX) 75 MG tablet Take 1 tablet (75 mg total) by mouth daily. 10/15/18 10/15/19 Yes Arida, Muhammad A, MD  °cyclobenzaprine (FEXMID) 7.5 MG tablet TAKE 1 TABLET BY MOUTH 3 TIMES A DAY AS NEEDED FOR MUSCLE  SPASMS 03/30/19  Yes Aron, Talia M, MD  °FLUoxetine HCl 60 MG TABS Take 60 mg by mouth daily. 05/21/19  Yes Arfeen, Syed T, MD  °insulin aspart protamine- aspart (NOVOLOG MIX 70/30) (70-30) 100 UNIT/ML injection UAD 5u bid; 25u prn flow chart 06/25/19  Yes Aron, Talia M, MD  °Insulin Glargine (LANTUS SOLOSTAR) 100 UNIT/ML Solostar Pen Inject 40 Units into the skin daily. 12/25/18  Yes Aron, Talia M, MD  °Insulin Syringe-Needle U-100 29G X 1/2" 0.3 ML MISC 1 each by Does not apply route 2 (two) times daily. 05/20/17  Yes Samtani, Jai-Gurmukh, MD  °isosorbide mononitrate (IMDUR) 30 MG 24 hr tablet TAKE 1 TABLET (30 MG TOTAL) BY MOUTH DAILY. PLEASE KEEP APPOINTMENT FOR FURTHER REFILLS! THANKS :) °Patient taking differently: Take 30 mg by mouth daily.  10/17/18  Yes Arida, Muhammad A, MD  °lamoTRIgine (LAMICTAL) 200 MG tablet Take 1 tablet (200 mg total) by mouth daily. 05/21/19  Yes Arfeen, Syed T, MD  °losartan (COZAAR) 100 MG tablet Take 1 tablet (100 mg total) by mouth daily. 07/01/19  Yes Arida, Muhammad A, MD  °magic mouthwash SOLN Take 15 mLs by mouth 3 (three) times daily as needed for mouth pain. 11/10/18  Yes Aron, Talia M, MD  °Magnesium 500 MG CAPS Take 1 capsule (500 mg total) by mouth 2 (two) times daily at 8 am and 10 pm. 07/02/19 09/30/19 Yes Naveira, Francisco, MD  °naloxone (NARCAN) nasal spray 4 mg/0.1 mL 4 mg (contents of 1 nasal spray) as a single dose in one nostril; may repeat every 2 to 3 minutes in alternating nostrils until medical assistance becomes available 12/25/18  Yes Aron, Talia M, MD  °nitroGLYCERIN (NITROSTAT) 0.4 MG SL tablet PLACE 1 TABLET (0.4 MG TOTAL) UNDER THE TONGUE EVERY 5 (FIVE) MINUTES AS NEEDED FOR CHEST PAIN. 12/30/18  Yes Aron, Talia M, MD  °ondansetron (ZOFRAN) 4 MG tablet Take 1 tablet (4 mg total) by mouth daily as needed. 06/25/19  Yes Aron, Talia M, MD  °oxyCODONE (OXY IR/ROXICODONE) 5 MG immediate release tablet Take 1 tablet (5 mg total) by mouth 2 (two) times daily as needed for  severe pain. Must last 30 days. 07/02/19 08/13/19 Yes Naveira, Francisco, MD  °oxyCODONE (OXY IR/ROXICODONE) 5 MG immediate release tablet Take 1 tablet (5 mg total) by mouth 2 (two) times daily as needed for severe pain. Must last 30 days. 08/01/19 08/31/19 Yes Naveira, Francisco, MD  °oxyCODONE (OXY IR/ROXICODONE) 5 MG immediate release tablet Take 1 tablet (5 mg total) by mouth 2 (two) times daily as needed for severe pain. Must last 30 days. 08/31/19 09/30/19 Yes Naveira, Francisco, MD  °pregabalin (LYRICA) 150 MG capsule Take 1 capsule (150 mg total) by mouth 2 (two) times daily   for 7 days, THEN 1 capsule (150 mg total) 3 (three) times daily. °Patient taking differently: Take 1 capsule (150 mg total) 3 (three) times daily. 08/12/19 11/10/19 Yes Naveira, Francisco, MD  °rosuvastatin (CRESTOR) 10 MG tablet Take 1 tablet (10 mg total) by mouth daily. 02/26/18  Yes Aron, Talia M, MD  °spironolactone (ALDACTONE) 25 MG tablet Take 1 tablet (25 mg total) by mouth daily. 04/13/19  Yes Arida, Muhammad A, MD  °Cholecalciferol (VITAMIN D3) 1.25 MG (50000 UT) CAPS Take 50,000 Units by mouth once a week. °Patient not taking: Reported on 08/13/2019 06/30/19   Aron, Talia M, MD  ° °Family History  °Adopted: Yes  °Problem Relation Age of Onset  °• Diabetes Mother   °• Diabetes Father   °• Alcohol abuse Father   °• Heart disease Father   °• Drug abuse Father   °• Stroke Sister   °• Anxiety disorder Sister   ° °Social History  ° °Socioeconomic History  °• Marital status: Married  °  Spouse name: Not on file  °• Number of children: 1  °• Years of education: 14  °• Highest education level: Not on file  °Occupational History  °• Occupation: Disability  °Social Needs  °• Financial resource strain: Not on file  °• Food insecurity  °  Worry: Not on file  °  Inability: Not on file  °• Transportation needs  °  Medical: Not on file  °  Non-medical: Not on file  °Tobacco Use  °• Smoking status: Current Every Day Smoker  °  Packs/day: 1.00  °  Years: 27.00   °  Pack years: 27.00  °  Types: Cigarettes  °• Smokeless tobacco: Never Used  °• Tobacco comment: patient smokes when she drives.   °Substance and Sexual Activity  °• Alcohol use: Yes  °  Alcohol/week: 0.0 standard drinks  °  Comment: socially  °• Drug use: No  °• Sexual activity: Yes  °  Partners: Male  °  Birth control/protection: None  °Lifestyle  °• Physical activity  °  Days per week: Not on file  °  Minutes per session: Not on file  °• Stress: Not on file  °Relationships  °• Social connections  °  Talks on phone: Not on file  °  Gets together: Not on file  °  Attends religious service: Not on file  °  Active member of club or organization: Not on file  °  Attends meetings of clubs or organizations: Not on file  °  Relationship status: Not on file  °• Intimate partner violence  °  Fear of current or ex partner: Not on file  °  Emotionally abused: Not on file  °  Physically abused: Not on file  °  Forced sexual activity: Not on file  °Other Topics Concern  °• Not on file  °Social History Narrative  ° Lives with husband  ° Caffeine use: Drinks 2 cups coffee per day  ° Right handed  °  ° °Review of Systems  °  °Progressive left lower extremity claudication as outlined above.  She also has chronic back pain.  She denies chest pain, dyspnea, palpitations, PND, orthopnea, dizziness, syncope, edema, or early satiety.  All other systems reviewed and are otherwise negative except as noted above. ° °Physical Exam  °  °VS:  BP (!) 198/120 (BP Location: Left Arm, Patient Position: Sitting, Cuff Size: Normal)    Pulse (!) 110    Ht 5' 9" (1.753   m)    Wt 209 lb 4 oz (94.9 kg)    BMI 30.90 kg/m²  , BMI Body mass index is 30.9 kg/m². °Repeat blood pressure was 200/120. °GEN: Well nourished, well developed, in no acute distress. °HEENT: normal. °Neck: Supple, no JVD, carotid bruits, or masses. °Cardiac: RRR, no murmurs, rubs, or gallops. No clubbing, cyanosis, edema.  I do not appreciate a left PT.  Left DP is 1+.  Right PT/DP  1+.   °Respiratory:  Respirations regular and unlabored, scattered rhonchi. °GI: Soft, nontender, nondistended, BS + x 4. °MS: no deformity or atrophy. °Skin: warm and dry, no rash. °Neuro:  Strength and sensation are intact. °Psych: Normal affect. ° °Accessory Clinical Findings  °  °ECG personally reviewed by me today -sinus tachycardia, 110, nonspecific ST and T changes- no acute changes. ° °Lab Results  °Component Value Date  ° WBC 12.1 (H) 06/29/2019  ° HGB 14.9 06/29/2019  ° HCT 44.5 06/29/2019  ° MCV 90.7 06/29/2019  ° PLT 358.0 06/29/2019  ° °Lab Results  °Component Value Date  ° CREATININE 0.66 06/29/2019  ° BUN 11 06/29/2019  ° NA 131 (L) 06/29/2019  ° K 4.5 06/29/2019  ° CL 95 (L) 06/29/2019  ° CO2 26 06/29/2019  ° °Lab Results  °Component Value Date  ° ALT 31 06/29/2019  ° AST 12 06/29/2019  ° ALKPHOS 109 06/29/2019  ° BILITOT 0.2 06/29/2019  ° °Lab Results  °Component Value Date  ° CHOL 183 06/29/2019  ° HDL 39.00 (L) 06/29/2019  ° LDLCALC UNABLE TO CALCULATE IF TRIGLYCERIDE OVER 400 mg/dL 01/07/2019  ° LDLDIRECT 116.0 06/29/2019  ° TRIG 338.0 (H) 06/29/2019  ° CHOLHDL 5 06/29/2019  °  ° °Assessment & Plan  °  °1.  Peripheral arterial disease with progressive left lower extremity claudication: Prior history of left SFA disease status post drug-eluting stents and drug coated balloon angioplasty in October 2019.  She initially did well following her procedure but when she was seen in May via telemedicine visit, she reported recurrent left lower extremity claudication.  ABIs prior to that visit showed a reduction in ABI to 0.69.  An initial trial of ongoing medical therapy and risk factor modification including aspirin and Plavix and smoking cessation was recommended however, she is continued to smoke just under a pack a day.  Unfortunately, she has had progressive left lower extremity claudication and can now only walk 20 to 30 feet.  She has not had any resting symptoms.  I will follow-up lab work today  and arrange for peripheral angiogram with Dr. Arida next week at [place].  Continue current regimen and titrate amlodipine for ongoing elevated blood pressure. ° °2.  Malignant hypertension: This has been chronic and persistent with systolics running in the 180s to 200s for a large portion of her adult life.  She was placed on amlodipine 5 mg daily at her last visit and unfortunately her pressure remains elevated today.  I am going to titrate her amlodipine to 10 mg daily and have asked her to call our office on Monday with blood pressure recordings.  She is otherwise on max dose beta-blocker and ARB along with spironolactone 25 mg daily.  I presume that she will need an additional agent and I will plan to add hydralazine 25 mg 3 times daily on Monday if pressures remain greater than 140 systolic.  We did discuss possibly using clonidine instead however, at baseline, she suffers from dry mouth. ° °3.  Hyperlipidemia:   Direct LDL of 116 in July on Crestor and Praluent. ° °4.  Coronary artery disease: Status post prior CABG with patent grafts on catheterization in 2017.  She has not been having any chest pain or back pain (anginal equivalent).  She remains on aspirin, Plavix, beta-blocker, ARB, nitrate, statin, and PCSK9 Inhibitor. ° °5.  Type 2 diabetes mellitus: On insulin and managed by primary care.  A1c was markedly elevated at 11.9 in July. ° °6.  Ongoing tobacco abuse: Smoking less than half a pack a day.  She recognizes that she needs to quit but is not quite motivated to quit at this point.  Complete cessation advised. ° °7.  Disposition: Follow-up lab work today in preparation for peripheral angiogram next week.  She is going to call us on Monday with blood pressures from over the weekend and we can consider adding an additional antihypertensive at that time.  Plan to follow-up with Dr. Arida approximately 2 to 4 weeks post angiography. ° ° °Tonie Vizcarrondo, NP °08/13/2019, 2:37 PM ° °

## 2019-08-14 ENCOUNTER — Other Ambulatory Visit
Admission: RE | Admit: 2019-08-14 | Discharge: 2019-08-14 | Disposition: A | Discharge status: Home / Self Care | Payer: BC Managed Care – PPO | Source: Ambulatory Visit | Attending: Cardiovascular Disease | Admitting: Cardiovascular Disease

## 2019-08-14 ENCOUNTER — Encounter: Payer: Self-pay | Admitting: Family Medicine

## 2019-08-14 ENCOUNTER — Other Ambulatory Visit: Payer: Self-pay

## 2019-08-14 DIAGNOSIS — Z20828 Contact with and (suspected) exposure to other viral communicable diseases: Secondary | ICD-10-CM | POA: Diagnosis not present

## 2019-08-14 DIAGNOSIS — Z01812 Encounter for preprocedural laboratory examination: Secondary | ICD-10-CM | POA: Diagnosis not present

## 2019-08-14 LAB — CBC WITH DIFFERENTIAL/PLATELET
Basophils Absolute: 0.1 10*3/uL (ref 0.0–0.2)
Basos: 1 %
EOS (ABSOLUTE): 0.2 10*3/uL (ref 0.0–0.4)
Eos: 2 %
Hematocrit: 50.2 % — ABNORMAL HIGH (ref 34.0–46.6)
Hemoglobin: 16.9 g/dL — ABNORMAL HIGH (ref 11.1–15.9)
Immature Grans (Abs): 0.1 10*3/uL (ref 0.0–0.1)
Immature Granulocytes: 1 %
Lymphocytes Absolute: 4.9 10*3/uL — ABNORMAL HIGH (ref 0.7–3.1)
Lymphs: 36 %
MCH: 30 pg (ref 26.6–33.0)
MCHC: 33.7 g/dL (ref 31.5–35.7)
MCV: 89 fL (ref 79–97)
Monocytes Absolute: 0.7 10*3/uL (ref 0.1–0.9)
Monocytes: 5 %
Neutrophils Absolute: 7.5 10*3/uL — ABNORMAL HIGH (ref 1.4–7.0)
Neutrophils: 55 %
Platelets: 499 10*3/uL — ABNORMAL HIGH (ref 150–450)
RBC: 5.63 x10E6/uL — ABNORMAL HIGH (ref 3.77–5.28)
RDW: 13 % (ref 11.7–15.4)
WBC: 13.5 10*3/uL — ABNORMAL HIGH (ref 3.4–10.8)

## 2019-08-14 LAB — BASIC METABOLIC PANEL
BUN/Creatinine Ratio: 12 (ref 9–23)
BUN: 8 mg/dL (ref 6–24)
CO2: 21 mmol/L (ref 20–29)
Calcium: 11.2 mg/dL — ABNORMAL HIGH (ref 8.7–10.2)
Chloride: 91 mmol/L — ABNORMAL LOW (ref 96–106)
Creatinine, Ser: 0.68 mg/dL (ref 0.57–1.00)
GFR calc Af Amer: 122 mL/min/{1.73_m2} (ref >59)
GFR calc non Af Amer: 106 mL/min/{1.73_m2} (ref >59)
Glucose: 319 mg/dL — ABNORMAL HIGH (ref 65–99)
Potassium: 4.6 mmol/L (ref 3.5–5.2)
Sodium: 133 mmol/L — ABNORMAL LOW (ref 134–144)

## 2019-08-14 LAB — SARS CORONAVIRUS 2 (TAT 6-24 HRS): SARS Coronavirus 2: NEGATIVE

## 2019-08-17 ENCOUNTER — Other Ambulatory Visit: Payer: Self-pay | Admitting: Nurse Practitioner

## 2019-08-17 MED ORDER — HYDRALAZINE HCL 25 MG PO TABS: 25.0000 mg | ORAL_TABLET | Freq: Three times a day (TID) | ORAL | 6 refills | Status: DC

## 2019-08-17 NOTE — Telephone Encounter (Signed)
Hydralazine 25mg  PO TID, # 90, six refills sent in to her phamacy.

## 2019-08-18 ENCOUNTER — Telehealth: Payer: Self-pay | Admitting: *Deleted

## 2019-08-18 NOTE — Telephone Encounter (Signed)
Pt contacted pre-abdominal aortogram  scheduled at [facility] for: Wednesday August 19, 2019 12:30 PM Verified arrival time and place: [place] [facility]) at: 10:30 AM   No solid food after midnight prior to cath, clear liquids until 5 AM day of procedure. Contrast allergy: no  Hold: Insulin-AM of procedure. 1/2 usual Lantus Insulin dose  HS prior to procedure. Spironolactone-AM of procedure.  Except hold medications AM meds can be  taken pre-cath with sip of water including: ASA 81 mg Plavix 75 mg  Confirmed patient has responsible person to drive home post procedure and observe 24 hours after arriving home: yes  Currently, due to Covid-19 pandemic, only one support person will be allowed with patient. Must be the same support person for that patient's entire stay, will be screened and required to wear a mask. They will be asked to wait in the waiting room for the duration of the patient's stay.  Patients are required to wear a mask when they enter the hospital.      COVID-19 Pre-Screening Questions:  . In the past 7 to 10 days have you had a cough,  shortness of breath, headache, congestion, fever (100 or greater) body aches, chills, sore throat, or sudden loss of taste or sense of smell? no . Have you been around anyone with known Covid 19? no . Have you been around anyone who is awaiting Covid 19 test results in the past 7 to 10 days? no . Have you been around anyone who has been exposed to Covid 19, or has mentioned symptoms of Covid 19 within the past 7 to 10 days? no   I reviewed procedure/mask/visitor, Covid-19 screening questions with patient, she verbalized understanding, thanked me for call.

## 2019-08-19 ENCOUNTER — Other Ambulatory Visit: Payer: Self-pay

## 2019-08-19 ENCOUNTER — Ambulatory Visit (HOSPITAL_COMMUNITY)
Admission: RE | Admit: 2019-08-19 | Discharge: 2019-08-19 | Disposition: A | Discharge status: Home / Self Care | Payer: BC Managed Care – PPO | Attending: Cardiovascular Disease | Admitting: Cardiovascular Disease

## 2019-08-19 ENCOUNTER — Encounter (HOSPITAL_COMMUNITY)
Admission: RE | Disposition: A | Discharge status: Home / Self Care | Payer: BC Managed Care – PPO | Source: Home / Self Care | Attending: Cardiovascular Disease

## 2019-08-19 DIAGNOSIS — Z951 Presence of aortocoronary bypass graft: Secondary | ICD-10-CM | POA: Insufficient documentation

## 2019-08-19 DIAGNOSIS — Z79899 Other long term (current) drug therapy: Secondary | ICD-10-CM | POA: Diagnosis not present

## 2019-08-19 DIAGNOSIS — I2510 Atherosclerotic heart disease of native coronary artery without angina pectoris: Secondary | ICD-10-CM | POA: Diagnosis not present

## 2019-08-19 DIAGNOSIS — F17210 Nicotine dependence, cigarettes, uncomplicated: Secondary | ICD-10-CM | POA: Insufficient documentation

## 2019-08-19 DIAGNOSIS — F329 Major depressive disorder, single episode, unspecified: Secondary | ICD-10-CM | POA: Insufficient documentation

## 2019-08-19 DIAGNOSIS — Z7982 Long term (current) use of aspirin: Secondary | ICD-10-CM | POA: Insufficient documentation

## 2019-08-19 DIAGNOSIS — E669 Obesity, unspecified: Secondary | ICD-10-CM | POA: Insufficient documentation

## 2019-08-19 DIAGNOSIS — Z794 Long term (current) use of insulin: Secondary | ICD-10-CM | POA: Diagnosis not present

## 2019-08-19 DIAGNOSIS — E785 Hyperlipidemia, unspecified: Secondary | ICD-10-CM | POA: Insufficient documentation

## 2019-08-19 DIAGNOSIS — Z6830 Body mass index (BMI) 30.0-30.9, adult: Secondary | ICD-10-CM | POA: Insufficient documentation

## 2019-08-19 DIAGNOSIS — I70212 Atherosclerosis of native arteries of extremities with intermittent claudication, left leg: Secondary | ICD-10-CM | POA: Diagnosis not present

## 2019-08-19 DIAGNOSIS — I739 Peripheral vascular disease, unspecified: Secondary | ICD-10-CM | POA: Diagnosis not present

## 2019-08-19 DIAGNOSIS — I10 Essential (primary) hypertension: Secondary | ICD-10-CM | POA: Insufficient documentation

## 2019-08-19 DIAGNOSIS — K760 Fatty (change of) liver, not elsewhere classified: Secondary | ICD-10-CM | POA: Insufficient documentation

## 2019-08-19 DIAGNOSIS — I252 Old myocardial infarction: Secondary | ICD-10-CM | POA: Diagnosis not present

## 2019-08-19 DIAGNOSIS — Z7902 Long term (current) use of antithrombotics/antiplatelets: Secondary | ICD-10-CM | POA: Insufficient documentation

## 2019-08-19 DIAGNOSIS — E1151 Type 2 diabetes mellitus with diabetic peripheral angiopathy without gangrene: Secondary | ICD-10-CM | POA: Insufficient documentation

## 2019-08-19 DIAGNOSIS — Z720 Tobacco use: Secondary | ICD-10-CM | POA: Diagnosis not present

## 2019-08-19 HISTORY — PX: ABDOMINAL AORTOGRAM W/LOWER EXTREMITY: CATH118223

## 2019-08-19 LAB — POCT I-STAT, CHEM 8
BUN: 12 mg/dL (ref 6–20)
Calcium, Ion: 1.29 mmol/L (ref 1.15–1.40)
Chloride: 96 mmol/L — ABNORMAL LOW (ref 98–111)
Creatinine, Ser: 0.5 mg/dL (ref 0.44–1.00)
Glucose, Bld: 147 mg/dL — ABNORMAL HIGH (ref 70–99)
HCT: 49 % — ABNORMAL HIGH (ref 36.0–46.0)
Hemoglobin: 16.7 g/dL — ABNORMAL HIGH (ref 12.0–15.0)
Potassium: 4 mmol/L (ref 3.5–5.1)
Sodium: 135 mmol/L (ref 135–145)
TCO2: 29 mmol/L (ref 22–32)

## 2019-08-19 LAB — PREGNANCY, URINE: Preg Test, Ur: NEGATIVE

## 2019-08-19 LAB — GLUCOSE, CAPILLARY: Glucose-Capillary: 112 mg/dL — ABNORMAL HIGH (ref 70–99)

## 2019-08-19 SURGERY — ABDOMINAL AORTOGRAM W/LOWER EXTREMITY
Anesthesia: LOCAL | Laterality: Bilateral

## 2019-08-19 MED ORDER — IODIXANOL 320 MG/ML IV SOLN
INTRAVENOUS | Status: DC | PRN
  Administered 2019-08-19: 63 mL via INTRA_ARTERIAL

## 2019-08-19 MED ORDER — MIDAZOLAM HCL 2 MG/2ML IJ SOLN
INTRAMUSCULAR | Status: DC | PRN
  Administered 2019-08-19: 2 mg via INTRAVENOUS

## 2019-08-19 MED ORDER — LIDOCAINE HCL (PF) 1 % IJ SOLN
INTRAMUSCULAR | Status: DC | PRN
  Administered 2019-08-19: 15 mL via INTRADERMAL

## 2019-08-19 MED ORDER — ONDANSETRON HCL 4 MG/2ML IJ SOLN
4.0000 mg | Freq: Once | INTRAMUSCULAR | Status: AC
  Administered 2019-08-19: 4 mg via INTRAVENOUS

## 2019-08-19 MED ORDER — MIDAZOLAM HCL 2 MG/2ML IJ SOLN
INTRAMUSCULAR | Status: AC
  Filled 2019-08-19: qty 2

## 2019-08-19 MED ORDER — ONDANSETRON HCL 4 MG/2ML IJ SOLN: 4.0000 mg | Freq: Four times a day (QID) | INTRAMUSCULAR | Status: DC | PRN

## 2019-08-19 MED ORDER — SODIUM CHLORIDE 0.9 % IV SOLN: 250.0000 mL | INTRAVENOUS | Status: DC | PRN

## 2019-08-19 MED ORDER — HEPARIN (PORCINE) IN NACL 1000-0.9 UT/500ML-% IV SOLN
INTRAVENOUS | Status: DC | PRN
  Administered 2019-08-19 (×2): 500 mL

## 2019-08-19 MED ORDER — CILOSTAZOL 50 MG PO TABS: 50.0000 mg | ORAL_TABLET | Freq: Two times a day (BID) | ORAL | 3 refills | Status: DC

## 2019-08-19 MED ORDER — LIDOCAINE HCL (PF) 1 % IJ SOLN
INTRAMUSCULAR | Status: AC
  Filled 2019-08-19: qty 30

## 2019-08-19 MED ORDER — HEPARIN (PORCINE) IN NACL 1000-0.9 UT/500ML-% IV SOLN
INTRAVENOUS | Status: AC
  Filled 2019-08-19: qty 1000

## 2019-08-19 MED ORDER — FENTANYL CITRATE (PF) 100 MCG/2ML IJ SOLN
INTRAMUSCULAR | Status: AC
  Filled 2019-08-19: qty 2

## 2019-08-19 MED ORDER — SODIUM CHLORIDE 0.9% FLUSH: 3.0000 mL | Freq: Two times a day (BID) | INTRAVENOUS | Status: DC

## 2019-08-19 MED ORDER — SODIUM CHLORIDE 0.9 % IV SOLN: INTRAVENOUS | Status: DC

## 2019-08-19 MED ORDER — SODIUM CHLORIDE 0.9 % IV SOLN
INTRAVENOUS | Status: DC
  Administered 2019-08-19: 12:00:00 via INTRAVENOUS

## 2019-08-19 MED ORDER — SODIUM CHLORIDE 0.9% FLUSH: 3.0000 mL | INTRAVENOUS | Status: DC | PRN

## 2019-08-19 MED ORDER — HYDRALAZINE HCL 20 MG/ML IJ SOLN: 5.0000 mg | INTRAMUSCULAR | Status: DC | PRN

## 2019-08-19 MED ORDER — ONDANSETRON HCL 4 MG/2ML IJ SOLN
INTRAMUSCULAR | Status: AC
  Filled 2019-08-19: qty 2

## 2019-08-19 MED ORDER — LABETALOL HCL 5 MG/ML IV SOLN: 10.0000 mg | INTRAVENOUS | Status: DC | PRN

## 2019-08-19 MED ORDER — ACETAMINOPHEN 325 MG PO TABS: 650.0000 mg | ORAL_TABLET | ORAL | Status: DC | PRN

## 2019-08-19 MED ORDER — FENTANYL CITRATE (PF) 100 MCG/2ML IJ SOLN
INTRAMUSCULAR | Status: DC | PRN
  Administered 2019-08-19 (×2): 50 ug via INTRAVENOUS

## 2019-08-19 SURGICAL SUPPLY — 14 items
CATH ANGIO 5F PIGTAIL 65CM (CATHETERS) ×1 IMPLANT
CATH CROSS OVER TEMPO 5F (CATHETERS) ×1 IMPLANT
CATH STRAIGHT 5FR 65CM (CATHETERS) ×1 IMPLANT
CLOSURE MYNX CONTROL 5F (Vascular Products) ×1 IMPLANT
KIT MICROPUNCTURE NIT STIFF (SHEATH) ×1 IMPLANT
KIT PV (KITS) ×2 IMPLANT
SHEATH PINNACLE 5F 10CM (SHEATH) ×1 IMPLANT
SHEATH PROBE COVER 6X72 (BAG) ×1 IMPLANT
STOPCOCK MORSE 400PSI 3WAY (MISCELLANEOUS) ×1 IMPLANT
SYR MEDRAD MARK 7 150ML (SYRINGE) ×2 IMPLANT
TRANSDUCER W/STOPCOCK (MISCELLANEOUS) ×2 IMPLANT
TRAY PV CATH (CUSTOM PROCEDURE TRAY) ×2 IMPLANT
TUBING CIL FLEX 10 FLL-RA (TUBING) ×1 IMPLANT
WIRE BENTSON .035X145CM (WIRE) ×1 IMPLANT

## 2019-08-19 NOTE — Discharge Instructions (Signed)
Stop taking clopidogrel (Plavix) and start taking cilostazol (Pletal) 50 mg twice daily.  A prescription was sent to your pharmacy.   Femoral Site Care This sheet gives you information about how to care for yourself after your procedure. Your health care provider may also give you more specific instructions. If you have problems or questions, contact your health care provider. What can I expect after the procedure? After the procedure, it is common to have:  Bruising that usually fades within 1-2 weeks.  Tenderness at the site. Follow these instructions at home: Wound care  Follow instructions from your health care provider about how to take care of your insertion site. Make sure you: ? Wash your hands with soap and water before you change your bandage (dressing). If soap and water are not available, use hand sanitizer. ? Remove your dressing as told by your health care provider. In 24-48 hours  Do not take baths, swim, or use a hot tub until your health care provider approves.  You may shower 24-48 hours after the procedure or as told by your health care provider. ? Gently wash the site with plain soap and water. ? Pat the area dry with a clean towel. ? Do not rub the site. This may cause bleeding.  Do not apply powder or lotion to the site. Keep the site clean and dry.  Check your femoral site every day for signs of infection. Check for: ? Redness, swelling, or pain. ? Fluid or blood. ? Warmth. ? Pus or a bad smell. Activity  For the first 2-3 days after your procedure, or as long as directed: ? Avoid climbing stairs as much as possible. ? Do not squat.  Do not lift anything that is heavier than 10 lb (4.5 kg), or the limit that you are told, until your health care provider says that it is safe. For 5 days  Rest as directed. ? Avoid sitting for a long time without moving. Get up to take short walks every 1-2 hours.  Do not drive for 24 hours if you were given a medicine  to help you relax (sedative). General instructions  Take over-the-counter and prescription medicines only as told by your health care provider.  Keep all follow-up visits as told by your health care provider. This is important. Contact a health care provider if you have:  A fever or chills.  You have redness, swelling, or pain around your insertion site. Get help right away if:  The catheter insertion area swells very fast.  You pass out.  You suddenly start to sweat or your skin gets clammy.  The catheter insertion area is bleeding, and the bleeding does not stop when you hold steady pressure on the area.  The area near or just beyond the catheter insertion site becomes pale, cool, tingly, or numb. These symptoms may represent a serious problem that is an emergency. Do not wait to see if the symptoms will go away. Get medical help right away. Call your local emergency services (911 in the [place] Do not drive yourself to the hospital. Summary  After the procedure, it is common to have bruising that usually fades within 1-2 weeks.  Check your femoral site every day for signs of infection.  Do not lift anything that is heavier than 10 lb (4.5 kg), or the limit that you are told, until your health care provider says that it is safe. This information is not intended to replace advice given to you by your  health care provider. Make sure you discuss any questions you have with your health care provider. Document Released: 08/13/2014 Document Revised: 12/23/2017 Document Reviewed: 12/23/2017 Elsevier Patient Education  2020 [employer].

## 2019-08-19 NOTE — Interval H&P Note (Signed)
History and Physical Interval Note:  08/19/2019 12:31 PM  Erika [name]  has presented today for surgery, with the diagnosis of PAD.  The various methods of treatment have been discussed with the patient and family. After consideration of risks, benefits and other options for treatment, the patient has consented to  Procedure(s): ABDOMINAL AORTOGRAM W/LOWER EXTREMITY (Bilateral) as a surgical intervention.  The patient's history has been reviewed, patient examined, no change in status, stable for surgery.  I have reviewed the patient's chart and labs.  Questions were answered to the patient's satisfaction.     [name]

## 2019-08-20 ENCOUNTER — Encounter (HOSPITAL_COMMUNITY): Payer: Self-pay | Admitting: Cardiovascular Disease

## 2019-08-20 ENCOUNTER — Encounter: Payer: Self-pay | Admitting: Pain Medicine

## 2019-08-21 ENCOUNTER — Encounter (HOSPITAL_COMMUNITY): Payer: Self-pay | Admitting: Cardiovascular Disease

## 2019-08-21 ENCOUNTER — Ambulatory Visit (INDEPENDENT_AMBULATORY_CARE_PROVIDER_SITE_OTHER): Payer: BC Managed Care – PPO | Admitting: Psychiatry

## 2019-08-21 ENCOUNTER — Other Ambulatory Visit: Payer: Self-pay

## 2019-08-21 DIAGNOSIS — F331 Major depressive disorder, recurrent, moderate: Secondary | ICD-10-CM | POA: Diagnosis not present

## 2019-08-21 DIAGNOSIS — F4310 Post-traumatic stress disorder, unspecified: Secondary | ICD-10-CM | POA: Diagnosis not present

## 2019-08-21 DIAGNOSIS — F410 Panic disorder [episodic paroxysmal anxiety] without agoraphobia: Secondary | ICD-10-CM | POA: Diagnosis not present

## 2019-08-21 MED ORDER — FLUOXETINE HCL 60 MG PO TABS: 60.0000 mg | ORAL_TABLET | Freq: Every day | ORAL | 0 refills | Status: DC

## 2019-08-21 MED ORDER — CHLORPROMAZINE HCL 50 MG PO TABS: 50.0000 mg | ORAL_TABLET | Freq: Every day | ORAL | 0 refills | Status: DC

## 2019-08-21 MED ORDER — LAMOTRIGINE 200 MG PO TABS: 200.0000 mg | ORAL_TABLET | Freq: Every day | ORAL | 0 refills | Status: DC

## 2019-08-21 MED ORDER — CLONAZEPAM 0.5 MG PO TABS: 0.5000 mg | ORAL_TABLET | Freq: Three times a day (TID) | ORAL | 2 refills | Status: DC | PRN

## 2019-08-21 MED ORDER — CLONIDINE HCL 0.1 MG PO TABS: 0.1000 mg | ORAL_TABLET | Freq: Two times a day (BID) | ORAL | 5 refills | Status: DC

## 2019-08-21 MED ORDER — BENZTROPINE MESYLATE 0.5 MG PO TABS: 0.5000 mg | ORAL_TABLET | Freq: Every day | ORAL | 0 refills | Status: DC
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
## 2019-08-21 NOTE — Telephone Encounter (Signed)
Please call Erika [name] (DOB: Jun 27, 1971) (MRN: [id number]) and offer to schedule:  Visit: Procedure Type: Hip injection (Gluteal Tendon)  NOTE: Request Ultrasound Machine from OR to be available for procedure.  Call patient to arrange.

## 2019-08-23 ENCOUNTER — Other Ambulatory Visit: Payer: Self-pay | Admitting: Cardiovascular Disease

## 2019-08-25 ENCOUNTER — Telehealth: Payer: Self-pay

## 2019-08-25 ENCOUNTER — Encounter: Payer: Self-pay | Admitting: Family Medicine

## 2019-08-25 NOTE — Telephone Encounter (Signed)
08/25/2019 @ 4:00 pm. [name] Key: [id number] -                               PA Case ID: [id number] - Rx #[id number]                             Prior Authorization sent for Praluent 150 MG/ML through covermymeds.

## 2019-08-27 ENCOUNTER — Encounter: Payer: Self-pay | Admitting: Family Medicine

## 2019-08-27 ENCOUNTER — Encounter: Payer: Self-pay | Admitting: Pain Medicine

## 2019-08-27 ENCOUNTER — Other Ambulatory Visit: Payer: Self-pay

## 2019-08-27 ENCOUNTER — Ambulatory Visit
Admission: RE | Admit: 2019-08-27 | Discharge: 2019-08-27 | Disposition: A | Discharge status: Home / Self Care | Payer: BC Managed Care – PPO | Source: Ambulatory Visit | Attending: Pain Medicine | Admitting: Pain Medicine

## 2019-08-27 ENCOUNTER — Ambulatory Visit (HOSPITAL_BASED_OUTPATIENT_CLINIC_OR_DEPARTMENT_OTHER): Payer: BC Managed Care – PPO | Admitting: Pain Medicine

## 2019-08-27 VITALS — BP 143/97 | HR 78 | Temp 98.9°F | Resp 25 | Ht 67.0 in | Wt 210.0 lb

## 2019-08-27 DIAGNOSIS — M7602 Gluteal tendinitis, left hip: Secondary | ICD-10-CM | POA: Diagnosis not present

## 2019-08-27 DIAGNOSIS — G8929 Other chronic pain: Secondary | ICD-10-CM | POA: Diagnosis not present

## 2019-08-27 DIAGNOSIS — M67952 Unspecified disorder of synovium and tendon, left thigh: Secondary | ICD-10-CM

## 2019-08-27 DIAGNOSIS — M1612 Unilateral primary osteoarthritis, left hip: Secondary | ICD-10-CM

## 2019-08-27 DIAGNOSIS — M25552 Pain in left hip: Secondary | ICD-10-CM | POA: Diagnosis not present

## 2019-08-27 MED ORDER — LIDOCAINE HCL 2 % IJ SOLN
20.0000 mL | Freq: Once | INTRAMUSCULAR | Status: AC
  Administered 2019-08-27: 10:00:00 400 mg
  Filled 2019-08-27: qty 40

## 2019-08-27 MED ORDER — HUMALOG 100 UNIT/ML ~~LOC~~ SOCT: SUBCUTANEOUS | 11 refills | Status: DC

## 2019-08-27 MED ORDER — IOHEXOL 180 MG/ML  SOLN
10.0000 mL | Freq: Once | INTRAMUSCULAR | Status: AC
  Administered 2019-08-27: 10 mL via INTRA_ARTICULAR

## 2019-08-27 MED ORDER — LACTATED RINGERS IV SOLN
1000.0000 mL | Freq: Once | INTRAVENOUS | Status: AC
  Administered 2019-08-27: 1000 mL via INTRAVENOUS

## 2019-08-27 MED ORDER — MIDAZOLAM HCL 5 MG/5ML IJ SOLN
1.0000 mg | INTRAMUSCULAR | Status: DC | PRN
  Administered 2019-08-27: 2 mg via INTRAVENOUS
  Filled 2019-08-27: qty 5

## 2019-08-27 MED ORDER — FENTANYL CITRATE (PF) 100 MCG/2ML IJ SOLN
25.0000 ug | INTRAMUSCULAR | Status: DC | PRN
  Administered 2019-08-27: 50 ug via INTRAVENOUS
  Filled 2019-08-27: qty 2

## 2019-08-27 MED ORDER — ROPIVACAINE HCL 2 MG/ML IJ SOLN
9.0000 mL | Freq: Once | INTRAMUSCULAR | Status: AC
  Administered 2019-08-27: 9 mL via INTRA_ARTICULAR
  Filled 2019-08-27: qty 10

## 2019-08-27 MED ORDER — METHYLPREDNISOLONE ACETATE 80 MG/ML IJ SUSP
80.0000 mg | Freq: Once | INTRAMUSCULAR | Status: AC
  Administered 2019-08-27: 80 mg via INTRA_ARTICULAR
  Filled 2019-08-27: qty 1

## 2019-08-27 NOTE — Progress Notes (Signed)
Changed per TA/thx dmf

## 2019-08-27 NOTE — Patient Instructions (Signed)
____________________________________________________________________________________________  Post-Procedure Discharge Instructions  Instructions:  Apply ice:   Purpose: This will minimize any swelling and discomfort after procedure.   When: Day of procedure, as soon as you get home.  How: Fill a plastic sandwich bag with crushed ice. Cover it with a small towel and apply to injection site.  How long: (15 min on, 15 min off) Apply for 15 minutes then remove x 15 minutes.  Repeat sequence on day of procedure, until you go to bed.  Apply heat:   Purpose: To treat any soreness and discomfort from the procedure.  When: Starting the next day after the procedure.  How: Apply heat to procedure site starting the day following the procedure.  How long: May continue to repeat daily, until discomfort goes away.  Food intake: Start with clear liquids (like water) and advance to regular food, as tolerated.   Physical activities: Keep activities to a minimum for the first 8 hours after the procedure. After that, then as tolerated.  Driving: If you have received any sedation, be responsible and do not drive. You are not allowed to drive for 24 hours after having sedation.  Blood thinner: (Applies only to those taking blood thinners) You may restart your blood thinner 6 hours after your procedure.  Insulin: (Applies only to Diabetic patients taking insulin) As soon as you can eat, you may resume your normal dosing schedule.  Infection prevention: Keep procedure site clean and dry. Shower daily and clean area with soap and water.  Post-procedure Pain Diary: Extremely important that this be done correctly and accurately. Recorded information will be used to determine the next step in treatment. For the purpose of accuracy, follow these rules:  Evaluate only the area treated. Do not report or include pain from an untreated area. For the purpose of this evaluation, ignore all other areas of pain,  except for the treated area.  After your procedure, avoid taking a long nap and attempting to complete the pain diary after you wake up. Instead, set your alarm clock to go off every hour, on the hour, for the initial 8 hours after the procedure. Document the duration of the numbing medicine, and the relief you are getting from it.  Do not go to sleep and attempt to complete it later. It will not be accurate. If you received sedation, it is likely that you were given a medication that may cause amnesia. Because of this, completing the diary at a later time may cause the information to be inaccurate. This information is needed to plan your care.  Follow-up appointment: Keep your post-procedure follow-up evaluation appointment after the procedure (usually 2 weeks for most procedures, 6 weeks for radiofrequencies). DO NOT FORGET to bring you pain diary with you.   Expect: (What should I expect to see with my procedure?)  From numbing medicine (AKA: Local Anesthetics): Numbness or decrease in pain. You may also experience some weakness, which if present, could last for the duration of the local anesthetic.  Onset: Full effect within 15 minutes of injected.  Duration: It will depend on the type of local anesthetic used. On the average, 1 to 8 hours.   From steroids (Applies only if steroids were used): Decrease in swelling or inflammation. Once inflammation is improved, relief of the pain will follow.  Onset of benefits: Depends on the amount of swelling present. The more swelling, the longer it will take for the benefits to be seen. In some cases, up to 10 days.    Duration: Steroids will stay in the system x 2 weeks. Duration of benefits will depend on multiple posibilities including persistent irritating factors.  Side-effects: If present, they may typically last 2 weeks (the duration of the steroids).  Frequent: Cramps (if they occur, drink Gatorade and take over-the-counter Magnesium 450-500 mg  once to twice a day); water retention with temporary weight gain; increases in blood sugar; decreased immune system response; increased appetite.  Occasional: Facial flushing (red, warm cheeks); mood swings; menstrual changes.  Uncommon: Long-term decrease or suppression of natural hormones; bone thinning. (These are more common with higher doses or more frequent use. This is why we prefer that our patients avoid having any injection therapies in other practices.)   Very Rare: Severe mood changes; psychosis; aseptic necrosis.  From procedure: Some discomfort is to be expected once the numbing medicine wears off. This should be minimal if ice and heat are applied as instructed.  Call if: (When should I call?)  You experience numbness and weakness that gets worse with time, as opposed to wearing off.  New onset bowel or bladder incontinence. (Applies only to procedures done in the spine)  Emergency Numbers:  Durning business hours (Monday - Thursday, 8:00 AM - 4:00 PM) (Friday, 9:00 AM - 12:00 Noon): [phone]  After hours: [phone]  NOTE: If you are having a problem and are unable connect with, or to talk to a provider, then go to your nearest urgent care or emergency department. If the problem is serious and urgent, please call 911. ____________________________________________________________________________________________    

## 2019-08-27 NOTE — Progress Notes (Signed)
Patient's Name: Erika [name]  MRN: [id number]  Referring Provider: Lucille Passy, MD  DOB: [birth year]  PCP: Lucille Passy, MD  DOS: 08/27/2019  Note by: Gaspar Cola, MD  Service setting: Ambulatory outpatient  Specialty: Interventional Pain Management  Patient type: Established  Location: [facility] (AMB) Pain Management Facility  Visit type: Interventional Procedure   Primary Reason for Visit: Interventional Pain Management Treatment. CC: Hip Pain  Procedure:          Anesthesia, Analgesia, Anxiolysis:  Type: insertion site of the left gluteal tendon  Bursa Injection #1  Primary Purpose: Diagnostic Region: Upper (proximal) Femoral Region Level: Hip Joint Target Area: Superior aspect of the hip joint cavity, going thru the superior portion of the capsular ligament. Approach: Posterior approach Laterality: Left  Type: Moderate (Conscious) Sedation combined with Local Anesthesia Indication(s): Analgesia and Anxiety Local Anesthetic: Lidocaine 1-2% Route: Intravenous (IV) IV Access: Secured Sedation: Meaningful verbal contact was maintained at all times during the procedure   Position: Prone   Indications: 1. Gluteal tendonitis of buttock (Left)   2. Tendinopathy of gluteus medius (Left)   3. Chronic hip pain (Left)   4. Osteoarthritis of hip (Left)    Pain Score: Pre-procedure: 4 /10 Post-procedure: 0-No pain/10   Pre-op Assessment:  Erika [name] is a 48 y.o. (year old), female patient, seen today for interventional treatment. She  has a past surgical history that includes Cholecystectomy; Tonsillectomy; Cesarean section; Cardiac catheterization ([name], 04/18/2016); Cardiac catheterization ([name], 06/29/2016); Coronary artery bypass graft ([name], 07/06/2016); TEE without cardioversion ([name], 07/06/2016); Endarterectomy (Right, 07/06/2016); Cardiac catheterization ([name], 08/29/2016); ABDOMINAL AORTOGRAM W/LOWER EXTREMITY ([name], 10/15/2018); PERIPHERAL VASCULAR INTERVENTION (Left, 10/15/2018);  and ABDOMINAL AORTOGRAM W/LOWER EXTREMITY (Bilateral, 08/19/2019). Erika [name] has a current medication list which includes the following prescription(s): albuterol, alirocumab, amlodipine, aspirin ec, benztropine, calcium carbonate, carvedilol, chlorpromazine, vitamin d3, cilostazol, clonazepam, clonidine, cyclobenzaprine, fluoxetine hcl, insulin aspart protamine- aspart, insulin glargine, insulin syringe-needle u-100, isosorbide mononitrate, lamotrigine, losartan, magic mouthwash, magnesium, naloxone, nitroglycerin, ondansetron, oxycodone, oxycodone, pregabalin, rosuvastatin, and spironolactone, and the following Facility-Administered Medications: fentanyl and midazolam. Her primarily concern today is the Hip Pain  Initial Vital Signs:  Pulse/HCG Rate: 78ECG Heart Rate: 81 Temp: 98.2 F (36.8 C) Resp: (!) 22 BP: 123/77 SpO2: 98 %  BMI: Estimated body mass index is 32.89 kg/m as calculated from the following:   Height as of this encounter: 5\' 7"  (1.702 m).   Weight as of this encounter: 210 lb (95.3 kg).  Risk Assessment: Allergies: Reviewed. She has No Known Allergies.  Allergy Precautions: None required Coagulopathies: Reviewed. None identified.  Blood-thinner therapy: None at this time Active Infection(s): Reviewed. None identified. Erika [name] is afebrile  Site Confirmation: Erika [name] was asked to confirm the procedure and laterality before marking the site Procedure checklist: Completed Consent: Before the procedure and under the influence of no sedative(s), amnesic(s), or anxiolytics, the patient was informed of the treatment options, risks and possible complications. To fulfill our ethical and legal obligations, as recommended by the American Medical Association's Code of Ethics, I have informed the patient of my clinical impression; the nature and purpose of the treatment or procedure; the risks, benefits, and possible complications of the intervention; the alternatives,  including doing nothing; the risk(s) and benefit(s) of the alternative treatment(s) or procedure(s); and the risk(s) and benefit(s) of doing nothing. The patient was provided information about the general risks and possible complications associated with the procedure. These may include, but are not limited to: failure to  achieve desired goals, infection, bleeding, organ or nerve damage, allergic reactions, paralysis, and death. In addition, the patient was informed of those risks and complications associated to the procedure, such as failure to decrease pain; infection; bleeding; organ or nerve damage with subsequent damage to sensory, motor, and/or autonomic systems, resulting in permanent pain, numbness, and/or weakness of one or several areas of the body; allergic reactions; (i.e.: anaphylactic reaction); and/or death. Furthermore, the patient was informed of those risks and complications associated with the medications. These include, but are not limited to: allergic reactions (i.e.: anaphylactic or anaphylactoid reaction(s)); adrenal axis suppression; blood sugar elevation that in diabetics may result in ketoacidosis or comma; water retention that in patients with history of congestive heart failure may result in shortness of breath, pulmonary edema, and decompensation with resultant heart failure; weight gain; swelling or edema; medication-induced neural toxicity; particulate matter embolism and blood vessel occlusion with resultant organ, and/or nervous system infarction; and/or aseptic necrosis of one or more joints. Finally, the patient was informed that Medicine is not an exact science; therefore, there is also the possibility of unforeseen or unpredictable risks and/or possible complications that may result in a catastrophic outcome. The patient indicated having understood very clearly. We have given the patient no guarantees and we have made no promises. Enough time was given to the patient to ask  questions, all of which were answered to the patient's satisfaction. Erika [name] has indicated that she wanted to continue with the procedure. Attestation: I, the ordering provider, attest that I have discussed with the patient the benefits, risks, side-effects, alternatives, likelihood of achieving goals, and potential problems during recovery for the procedure that I have provided informed consent. Date  Time: 08/27/2019  8:41 AM  Pre-Procedure Preparation:  Monitoring: As per clinic protocol. Respiration, ETCO2, SpO2, BP, heart rate and rhythm monitor placed and checked for adequate function Safety Precautions: Patient was assessed for positional comfort and pressure points before starting the procedure. Time-out: I initiated and conducted the "Time-out" before starting the procedure, as per protocol. The patient was asked to participate by confirming the accuracy of the "Time Out" information. Verification of the correct person, site, and procedure were performed and confirmed by me, the nursing staff, and the patient. "Time-out" conducted as per Joint Commission's Universal Protocol (UP.01.01.01). Time: 0948  Description of Procedure:          Area Prepped: Entire Posterior hip area. Prepping solution: DuraPrep (Iodine Povacrylex [0.7% available iodine] and Isopropyl Alcohol, 74% w/w) Safety Precautions: Aspiration looking for blood return was conducted prior to all injections. At no point did we inject any substances, as a needle was being advanced. No attempts were made at seeking any paresthesias. Safe injection practices and needle disposal techniques used. Medications properly checked for expiration dates. SDV (single dose vial) medications used. Description of the Procedure: Protocol guidelines were followed. The patient was placed in position over the procedure table. The target area was identified and the area prepped in the usual manner. Skin & deeper tissues infiltrated with local  anesthetic. Appropriate amount of time allowed to pass for local anesthetics to take effect. The procedure needles were then advanced to the target area. Proper needle placement secured. Negative aspiration confirmed. Solution injected in intermittent fashion, asking for systemic symptoms every 0.5cc of injectate. The needles were then removed and the area cleansed, making sure to leave some of the prepping solution back to take advantage of its long term bactericidal properties. Vitals:   08/27/19 [id number]  08/27/19 1007 08/27/19 1017 08/27/19 1027  BP: 130/79 (!) 121/94 134/79 (!) 143/97  Pulse:      Resp: (!) 21 19 (!) 25 (!) 25  Temp:  98.1 F (36.7 C)  98.9 F (37.2 C)  TempSrc:  Temporal  Temporal  SpO2: 96% 98% 98% 98%  Weight:      Height:        Start Time: 0948 hrs. End Time: 0958 hrs.      Materials:  Needle(s) Type: Spinal Needle Gauge: 22G Length: 5.0-in Medication(s): Please see orders for medications and dosing details.  Imaging Guidance (Non-Spinal):          Type of Imaging Technique: Fluoroscopy Guidance (Non-Spinal) + ultrasound Indication(s): Assistance in needle guidance and placement for procedures requiring needle placement in or near specific anatomical locations not easily accessible without such assistance. Exposure Time: Please see nurses notes. Contrast: Before injecting any contrast, we confirmed that the patient did not have an allergy to iodine, shellfish, or radiological contrast. Once satisfactory needle placement was completed at the desired level, radiological contrast was injected. Contrast injected under live fluoroscopy. No contrast complications. See chart for type and volume of contrast used. Fluoroscopic Guidance: I was personally present during the use of fluoroscopy. "Tunnel Vision Technique" used to obtain the best possible view of the target area. Parallax error corrected before commencing the procedure. "Direction-depth-direction" technique used  to introduce the needle under continuous pulsed fluoroscopy. Once target was reached, antero-posterior, oblique, and lateral fluoroscopic projection used confirm needle placement in all planes. Images permanently stored in EMR. Ultrasound guidance: Similar approach to that used by fluoroscopic guidance. Interpretation: I personally interpreted the imaging intraoperatively. Adequate needle placement confirmed in multiple planes. Appropriate spread of contrast into desired area was observed. No evidence of afferent or efferent intravascular uptake. Permanent images saved into the patient's record.  Antibiotic Prophylaxis:   Anti-infectives (From admission, onward)   None     Indication(s): None identified  Post-operative Assessment:  Post-procedure Vital Signs:  Pulse/HCG Rate: 7876 Temp: 98.9 F (37.2 C) Resp: (!) 25 BP: (!) 143/97 SpO2: 98 %  EBL: None  Complications: No immediate post-treatment complications observed by team, or reported by patient.  Note: The patient tolerated the entire procedure well. A repeat set of vitals were taken after the procedure and the patient was kept under observation following institutional policy, for this type of procedure. Post-procedural neurological assessment was performed, showing return to baseline, prior to discharge. The patient was provided with post-procedure discharge instructions, including a section on how to identify potential problems. Should any problems arise concerning this procedure, the patient was given instructions to immediately contact us, at any time, without hesitation. In any case, we plan to contact the patient by telephone for a follow-up status report regarding this interventional procedure.  Comments:  No additional relevant information.  Plan of Care  Orders:  Orders Placed This Encounter  Procedures  . HIP INJECTION    Scheduling Instructions:     Procedure: Injection around the insertion site of the gluteal  tendons (Left)     Side: Left-sided     Sedation: With Sedation.     Timeframe: Today  . DG PA[facility] C-ARM 1-60 MIN NO REPORT    Intraoperative interpretation by procedural physician at [place].    Standing Status:   Standing    Number of Occurrences:   1    Order Specific Question:   Reason for exam:    Answer:  Assistance in needle guidance and placement for procedures requiring needle placement in or near specific anatomical locations not easily accessible without such assistance.  . Provider attestation of informed consent for procedure/surgical case    I, the ordering provider, attest that I have discussed with the patient the benefits, risks, side effects, alternatives, likelihood of achieving goals and potential problems during recovery for the procedure that I have provided informed consent.    Standing Status:   Standing    Number of Occurrences:   1  . Informed Consent Details: Transcribe to consent form and obtain patient signature    Consent Attestation: I, the ordering provider, attest that I have discussed with the patient the benefits, risks, side-effects, alternatives, likelihood of achieving goals, and potential problems during recovery for the procedure that I have provided informed consent.    Standing Status:   Standing    Number of Occurrences:   1    Order Specific Question:   Procedure    Answer:   Left-sided intra-articular hip joint injection under fluoroscopic guidance    Order Specific Question:   Surgeon    Answer:   Shaylyn Bawa A. Dossie Arbour, MD    Order Specific Question:   Indication/Reason    Answer:   Left hip pain secondary to osteoarthritis of the hip   Chronic Opioid Analgesic:  Oxycodone IR 5 mg. 1 tab PO BID (10 mg/day of oxycodone) (enough to last to 09/30/2019) MME/day: 15 mg/day.   Medications ordered for procedure: Meds ordered this encounter  Medications  . iohexol (OMNIPAQUE) 180 MG/ML injection 10 mL    Must be  Myelogram-compatible. If not available, you may substitute with a water-soluble, non-ionic, hypoallergenic, myelogram-compatible radiological contrast medium.  Marland Kitchen lidocaine (XYLOCAINE) 2 % (with pres) injection 400 mg  . lactated ringers infusion 1,000 mL  . midazolam (VERSED) 5 MG/5ML injection 1-2 mg    Make sure Flumazenil is available in the pyxis when using this medication. If oversedation occurs, administer 0.2 mg IV over 15 sec. If after 45 sec no response, administer 0.2 mg again over 1 min; may repeat at 1 min intervals; not to exceed 4 doses (1 mg)  . fentaNYL (SUBLIMAZE) injection 25-50 mcg    Make sure Narcan is available in the pyxis when using this medication. In the event of respiratory depression (RR< 8/min): Titrate NARCAN (naloxone) in increments of 0.1 to 0.2 mg IV at 2-3 minute intervals, until desired degree of reversal.  . ropivacaine (PF) 2 mg/mL (0.2%) (NAROPIN) injection 9 mL  . methylPREDNISolone acetate (DEPO-MEDROL) injection 80 mg   Medications administered: We administered iohexol, lidocaine, lactated ringers, midazolam, fentaNYL, ropivacaine (PF) 2 mg/mL (0.2%), and methylPREDNISolone acetate.  See the medical record for exact dosing, route, and time of administration.  Follow-up plan:   Return in about 2 weeks (around 09/10/2019) for (VV), E/M, (PP).       Interventional management options: Considering:   NOTE: PLAVIX ANTICOAGULATION(Stop:7-10 days pre-procedure; Restart:2 hours post-proc.) Diagnostic right sided facial nerve block Diagnostic right-sided geniculate nerve block  Diagnostic right-sided occipital nerve block  Possible right-sided occipital nerveRFA Diagnostic Botox injections   Palliative PRN treatment(s):   Diagnostic left peri-insertional gluteal tendon injection #2  Diagnostic Facial (CN VII) nerve block  #4  Diagnostic right facial/Geniculatenerve block #3       Recent Visits Date Type Provider Dept  08/12/19 Office Visit  Milinda Pointer, [facility]  07/15/19 Office Visit Milinda Pointer, [facility]  06/29/19 Office  Visit Milinda Pointer, MD [facility]  Showing recent visits within past 90 days and meeting all other requirements   Today's Visits Date Type Provider Dept  08/27/19 Procedure visit Milinda Pointer, MD [facility]  Showing today's visits and meeting all other requirements   Future Appointments Date Type Provider Dept  09/14/19 Appointment Milinda Pointer, MD [facility]  09/30/19 Appointment Milinda Pointer, MD [facility]  Showing future appointments within next 90 days and meeting all other requirements   Disposition: Discharge home  Discharge Date & Time: 08/27/2019; 1033 hrs.   Primary Care Physician: Lucille Passy, MD Location: [facility] Note by: Gaspar Cola, MD Date: 08/27/2019; Time: 11:05 AM  Disclaimer:  Medicine is not an Chief Strategy Officer. The only guarantee in medicine is that nothing is guaranteed. It is important to note that the decision to proceed with this intervention was based on the information collected from the patient. The Data and conclusions were drawn from the patient's questionnaire, the interview, and the physical examination. Because the information was provided in large part by the patient, it cannot be guaranteed that it has not been purposely or unconsciously manipulated. Every effort has been made to obtain as much relevant data as possible for this evaluation. It is important to note that the conclusions that lead to this procedure are derived in large part from the available data. Always take into account that the treatment will also be dependent on availability of resources and existing treatment guidelines, considered by other Pain Management Practitioners as being common knowledge and practice, at the time of the intervention. For Medico-Legal  purposes, it is also important to point out that variation in procedural techniques and pharmacological choices are the acceptable norm. The indications, contraindications, technique, and results of the above procedure should only be interpreted and judged by a Board-Certified Interventional Pain Specialist with extensive familiarity and expertise in the same exact procedure and technique.

## 2019-08-28 ENCOUNTER — Telehealth: Payer: Self-pay

## 2019-08-28 NOTE — Telephone Encounter (Signed)
[employer] [place] has approved the [employer] from 08/25/2019 to 12/23/9998.

## 2019-08-28 NOTE — Telephone Encounter (Signed)
Post procedure phone call.  LM 

## 2019-09-01 ENCOUNTER — Ambulatory Visit: Payer: BC Managed Care – PPO | Admitting: Family Medicine

## 2019-09-03 ENCOUNTER — Ambulatory Visit: Payer: BC Managed Care – PPO | Admitting: Cardiovascular Disease

## 2019-09-04 ENCOUNTER — Other Ambulatory Visit: Payer: Self-pay

## 2019-09-04 MED ORDER — INSULIN LISPRO (1 UNIT DIAL) 100 UNIT/ML (KWIKPEN): PEN_INJECTOR | SUBCUTANEOUS | 11 refills | Status: DC

## 2019-09-06 DIAGNOSIS — D72829 Elevated white blood cell count, unspecified: Secondary | ICD-10-CM | POA: Insufficient documentation

## 2019-09-06 DIAGNOSIS — Z0000 Encounter for general adult medical examination without abnormal findings: Secondary | ICD-10-CM | POA: Insufficient documentation

## 2019-09-06 DIAGNOSIS — D751 Secondary polycythemia: Secondary | ICD-10-CM | POA: Insufficient documentation

## 2019-09-06 NOTE — Assessment & Plan Note (Signed)
Repeat Calcium, PTH today. Check Vit D as well.

## 2019-09-06 NOTE — Progress Notes (Signed)
Subjective:   Patient ID: Erika [name], female    DOB: 09-Jan-1971, 48 y.o.   MRN: [id number]  Erika [name] is a pleasant 48 y.o. year old female who presents to clinic today with Annual Exam (diabetic foot exam/ flu and pneumonia shot/needs injector pen for insulin) and Follow-up (multiple chronic medical conditions)  on 7/37/1062  HPI: Very complicated medical history, including CAD s/p CABG x 3, carotid arterial disease s/p right CEA, diastolic CHAF, HTN, HLD, diabetes, tobacco abuse, PTSD, depression  and PAD.   Health Maintenance  Topic Date Due  . FOOT EXAM  08/15/2018  . OPHTHALMOLOGY EXAM  10/26/2018  . INFLUENZA VACCINE  07/25/2019  . HEMOGLOBIN A1C  12/30/2019  . TETANUS/TDAP  07/26/2020  . PAP SMEAR-Modifier  02/25/2021  . PNEUMOCOCCAL POLYSACCHARIDE VACCINE AGE 33-64 HIGH RISK  Completed  . HIV Screening  Completed   Last pap smear done by me on 02/28/18. Mammogram ordered by me on 06/25/19 for [name] but does not appear to be scheduled yet.  HLD- she is followed by cardiology, Dr. Fletcher Anon and did recently start Praulent injections.   Lab Results  Component Value Date   CHOL 183 06/29/2019   HDL 39.00 (L) 06/29/2019   LDLCALC UNABLE TO CALCULATE IF TRIGLYCERIDE OVER 400 mg/dL 01/07/2019   LDLDIRECT 116.0 06/29/2019   TRIG 338.0 (H) 06/29/2019   CHOLHDL 5 06/29/2019   Lab Results  Component Value Date   ALT 31 06/29/2019   AST 12 06/29/2019   ALKPHOS 109 06/29/2019   BILITOT 0.2 06/29/2019    PAD- also followed by cardiology for PAD, CHF, and carotid arterial disease. Last saw Murray Hodgkins on 08/13/19- note reivewed. In October 2019, successful drug-eluting stent placement was performed to the mid and distal left SFA and drug coated balloon angioplasty was performed to the proximal left SFA.  On the right, there was borderline significant mid SFA stenosis.  At the 08/13/19 OV, she complained of progressive left LE claudication.    An initial trial of ongoing medical therapy and risk factor modification including aspirin and Plavix and smoking cessation was recommended however, she is continued to smoke just under a pack a day.    Unfortunately, she was experiencing progressive left lower extremity claudication and could now only walk 20 to 30 feet.  She did not have any resting symptoms.  She therefore underwent ABDOMINAL AORTOGRAM W/LOWER EXTREMITY (Bilateral) as a surgical intervention with Dr. Fletcher Anon on 08/19/19.   It was unfortunately not successful.  Continue current regimen and titrate amlodipine for ongoing elevated blood pressure.  Discharged home on Cilostazol 50 mg twice daily, hydralazine 25 mg three times daily, losartan 100 mg daily, Isosoribide mononitrate 30 mg daily, spironolactone 25 mg daily, crestor 10 mg daily.  Also takes Coreg and Catapress.  Tobacco abuse- unfortunately chantix made her "crazy,: zyban made her depression worse.  Smokes 1 ppd.  Hypercalcemia- found on labs prior to surgery.   She does have h/o Vit D deficiency.  Also leukocytosis and H/H were both much higher than previous labs.  Assumed her mild polycythemia in past was due to smoking.   Lab Results  Component Value Date   WBC 13.5 (H) 08/13/2019   HGB 16.7 (H) 08/19/2019   HCT 49.0 (H) 08/19/2019   MCV 89 08/13/2019   PLT 499 (H) 08/13/2019     DM- Lab Results  Component Value Date   HGBA1C 11.9 (H) 06/29/2019   a1c has remained incredibly high-  diabetes remains very poorly controlled.  Currently taking Lantus 40 units nightly with SSI.  8 months ago, a1c was 9.9 and was trending down. She was worried during covid that her daughter would run out of insulin so she gave her insulin toh er.  Chronic pain- followed by Dr. Dossie Arbour for OA of left hip and tenonitis of gluteus medius. Last saw Dr. Lowella Dandy on 08/27/19.  Note reviewed.  Given  hip injections. They stopped working. He also prescribses her lyrica and oxycodone.  PDMP  reviewed.  Anxiety, depression, PTSD- followed by psychiatry, Dr. Dossie Der.  She iso n Prozac 60 mg daily, thorazine 50 mg nightly, beztropine, and Lamictal 200 mg daily.   Depression screen [facility] 2/9 02/19/2019 01/27/2019 01/19/2019 01/07/2019 06/24/2018  Decreased Interest 0 0 0 0 3  Down, Depressed, Hopeless 0 0 0 0 3  PHQ - 2 Score 0 0 0 0 6  Altered sleeping - - - - 3  Tired, decreased energy - - - - 3  Change in appetite - - - - 3  Feeling bad or failure about yourself  - - - - 3  Trouble concentrating - - - - 3  Moving slowly or fidgety/restless - - - - 1  Suicidal thoughts - - - - 0  PHQ-9 Score - - - - 22  Difficult doing work/chores - - - - Very difficult  Some recent data might be hidden      Current Outpatient Medications on File Prior to Visit  Medication Sig Dispense Refill  . albuterol (PROVENTIL HFA;VENTOLIN HFA) 108 (90 Base) MCG/ACT inhaler Inhale 2 puffs into the lungs every 6 (six) hours as needed for wheezing. 1 Inhaler 0  . Alirocumab (PRALUENT) 150 MG/ML SOAJ Inject 150 mg into the skin every 14 (fourteen) days. (Patient taking differently: Inject 150 mg into the skin every 14 (fourteen) days. Last dose 3 week ago) 2 pen 11  . amLODipine (NORVASC) 10 MG tablet Take 1 tablet (10 mg total) by mouth daily. 90 tablet 3  . aspirin EC 81 MG tablet Take 1 tablet (81 mg total) by mouth daily. 90 tablet 3  . benztropine (COGENTIN) 0.5 MG tablet Take 1 tablet (0.5 mg total) by mouth daily. 90 tablet 0  . calcium carbonate (CALCIUM 600) 600 MG TABS tablet Take 1 tablet (600 mg total) by mouth 2 (two) times daily with a meal. 180 tablet 0  . carvedilol (COREG) 25 MG tablet TAKE 1 TABLET (25 MG TOTAL) BY MOUTH 2 (TWO) TIMES DAILY WITH A MEAL. 60 tablet 5  . chlorproMAZINE (THORAZINE) 50 MG tablet Take 1 tablet (50 mg total) by mouth at bedtime. 90 tablet 0  . Cholecalciferol (VITAMIN D3) 125 MCG (5000 UT) CAPS Take 1 capsule (5,000 Units total) by mouth daily with breakfast. Take along  with calcium and magnesium. 90 capsule 0  . cilostazol (PLETAL) 50 MG tablet Take 1 tablet (50 mg total) by mouth 2 (two) times daily. (Patient taking differently: Take 50 mg by mouth 2 (two) times daily. 1st dose was yesterday) 60 tablet 3  . clonazePAM (KLONOPIN) 0.5 MG tablet Take 1 tablet (0.5 mg total) by mouth 3 (three) times daily as needed for anxiety. 90 tablet 2  . cloNIDine (CATAPRES) 0.1 MG tablet Take 1 tablet (0.1 mg total) by mouth 2 (two) times daily. 60 tablet 5  . cyclobenzaprine (FEXMID) 7.5 MG tablet TAKE 1 TABLET BY MOUTH 3 TIMES A DAY AS NEEDED FOR MUSCLE SPASMS 90 tablet 5  .  FLUoxetine HCl 60 MG TABS Take 60 mg by mouth daily. 90 tablet 0  . Insulin Glargine (LANTUS SOLOSTAR) 100 UNIT/ML Solostar Pen Inject 40 Units into the skin daily. (Patient taking differently: Inject 40 Units into the skin at bedtime. ) 5 pen PRN  . insulin lispro (HUMALOG KWIKPEN) 100 UNIT/ML KwikPen UAD: inj 5u Sq bid; 25u prn flow chart 15 mL 11  . Insulin Syringe-Needle U-100 29G X 1/2" 0.3 ML MISC 1 each by Does not apply route 2 (two) times daily. 30 each 0  . isosorbide mononitrate (IMDUR) 30 MG 24 hr tablet TAKE 1 TABLET (30 MG TOTAL) BY MOUTH DAILY. PLEASE KEEP APPOINTMENT FOR FURTHER REFILLS! THANKS :) (Patient taking differently: Take 30 mg by mouth daily. ) 90 tablet 3  . lamoTRIgine (LAMICTAL) 200 MG tablet Take 1 tablet (200 mg total) by mouth daily. 90 tablet 0  . losartan (COZAAR) 100 MG tablet Take 1 tablet (100 mg total) by mouth daily. 90 tablet 3  . magic mouthwash SOLN Take 15 mLs by mouth 3 (three) times daily as needed for mouth pain. 250 mL 1  . Magnesium 500 MG CAPS Take 1 capsule (500 mg total) by mouth 2 (two) times daily at 8 am and 10 pm. 180 capsule 0  . naloxone (NARCAN) nasal spray 4 mg/0.1 mL 4 mg (contents of 1 nasal spray) as a single dose in one nostril; may repeat every 2 to 3 minutes in alternating nostrils until medical assistance becomes available 1 kit 0  .  nitroGLYCERIN (NITROSTAT) 0.4 MG SL tablet PLACE 1 TABLET (0.4 MG TOTAL) UNDER THE TONGUE EVERY 5 (FIVE) MINUTES AS NEEDED FOR CHEST PAIN. 25 tablet PRN  . ondansetron (ZOFRAN) 4 MG tablet Take 1 tablet (4 mg total) by mouth daily as needed. 20 tablet 5  . oxyCODONE (OXY IR/ROXICODONE) 5 MG immediate release tablet Take 1 tablet (5 mg total) by mouth 2 (two) times daily as needed for severe pain. Must last 30 days. 60 tablet 0  . pregabalin (LYRICA) 150 MG capsule Take 1 capsule (150 mg total) by mouth 2 (two) times daily for 7 days, THEN 1 capsule (150 mg total) 3 (three) times daily. (Patient taking differently: Take 1 capsule (150 mg total) 3 (three) times daily.) 263 capsule 0  . rosuvastatin (CRESTOR) 10 MG tablet Take 1 tablet (10 mg total) by mouth daily. 90 tablet 3  . spironolactone (ALDACTONE) 25 MG tablet Take 1 tablet (25 mg total) by mouth daily. 90 tablet 3  . oxyCODONE (OXY IR/ROXICODONE) 5 MG immediate release tablet Take 1 tablet (5 mg total) by mouth 2 (two) times daily as needed for severe pain. Must last 30 days. 60 tablet 0   No current facility-administered medications on file prior to visit.     No Known Allergies  Past Medical History:  Diagnosis Date  . Arthralgia of temporomandibular joint   . CAD, multiple vessel    a. cath 06/29/16: ostLM 40%, ostLAD 40%, pLAD 95%, ost-pLCx 60%, pLCx 95%, mLCx 60%, mRCA 95%, D2 50%, LVSF nl;  b. 07/2016 CABG x 4 (LIMA->LAD, VG->Diag, VG->OM, VG->RCA); c. 08/2016 Cath: 3VD w/ 4/4 patent grafts. LAD distal to LIMA has diff dzs->Med rx.  . Carotid arterial disease ([place])    a. 07/2016 s/p R CEA.  . Depression   . Diastolic dysfunction    a. echo 06/28/16: EF 50-55%, mild inf wall HK, GR1DD, mild MR, RV sys fxn nl, mildly dilated LA, PASP nl  .  Fatty liver disease, nonalcoholic 9326  . HLD (hyperlipidemia)   . Labile hypertension    a. prior renal ngiogram negative for RAS in 03/2016; b. catecholamines and metanephrines normal, mildly  elevated renin with normal aldosterone and normal ratio in 02/2016  . Obesity   . PTSD (post-traumatic stress disorder)   . Tobacco abuse    a. 2018 - cut back from 2 ppd to 0.5 ppd.  . Type 2 diabetes mellitus [name]) January 2017    Past Surgical History:  Procedure Laterality Date  . ABDOMINAL AORTOGRAM W/LOWER EXTREMITY [name] 10/15/2018   Procedure: ABDOMINAL AORTOGRAM W/LOWER EXTREMITY;  Surgeon: Wellington Hampshire, MD;  Location: [place] [facility];  Service: Cardiovascular;  Laterality: [name];  . ABDOMINAL AORTOGRAM W/LOWER EXTREMITY Bilateral 08/19/2019   Procedure: ABDOMINAL AORTOGRAM W/LOWER EXTREMITY;  Surgeon: Wellington Hampshire, MD;  Location: [place] [facility];  Service: Cardiovascular;  Laterality: Bilateral;  . CARDIAC CATHETERIZATION [name] 06/29/2016   Procedure: Left Heart Cath and Coronary Angiography;  Surgeon: Minna Merritts, MD;  Location: [place] [facility];  Service: Cardiovascular;  Laterality: [name];  . CARDIAC CATHETERIZATION [name] 08/29/2016   Procedure: Left Heart Cath and Cors/Grafts Angiography;  Surgeon: Wellington Hampshire, MD;  Location: [place] [facility];  Service: Cardiovascular;  Laterality: [name];  . CESAREAN SECTION    . CHOLECYSTECTOMY    . CORONARY ARTERY BYPASS GRAFT [name] 07/06/2016   Procedure: CORONARY ARTERY BYPASS GRAFTING (CABG) x four, using left internal mammary artery and right leg greater saphenous vein harvested endoscopically;  Surgeon: Ivin Poot, MD;  Location: [place];  Service: Open Heart Surgery;  Laterality: [name];  . ENDARTERECTOMY Right 07/06/2016   Procedure: ENDARTERECTOMY CAROTID;  Surgeon: Rosetta Posner, MD;  Location: [place];  Service: Vascular;  Laterality: Right;  . PERIPHERAL VASCULAR CATHETERIZATION [name] 04/18/2016   Procedure: Renal Angiography;  Surgeon: Wellington Hampshire, MD;  Location: [place] [facility];  Service: Cardiovascular;  Laterality: [name];  . PERIPHERAL VASCULAR INTERVENTION Left 10/15/2018   Procedure: PERIPHERAL VASCULAR  INTERVENTION;  Surgeon: Wellington Hampshire, MD;  Location: [place] [facility];  Service: Cardiovascular;  Laterality: Left;  Left superficial femoral  . TEE WITHOUT CARDIOVERSION [name] 07/06/2016   Procedure: TRANSESOPHAGEAL ECHOCARDIOGRAM (TEE);  Surgeon: Ivin Poot, MD;  Location: [place];  Service: Open Heart Surgery;  Laterality: [name];  . TONSILLECTOMY      Family History  Adopted: Yes  Problem Relation Age of Onset  . Diabetes Mother   . Diabetes Father   . Alcohol abuse Father   . Heart disease Father   . Drug abuse Father   . Stroke Sister   . Anxiety disorder Sister     Social History   Socioeconomic History  . Marital status: Married    Spouse name: Not on file  . Number of children: 1  . Years of education: 65  . Highest education level: Not on file  Occupational History  . Occupation: Disability  Social Needs  . Financial resource strain: Not on file  . Food insecurity    Worry: Not on file    Inability: Not on file  . Transportation needs    Medical: Not on file    Non-medical: Not on file  Tobacco Use  . Smoking status: Current Every Day Smoker    Packs/day: 1.00    Years: 27.00    Pack years: 27.00    Types: Cigarettes  . Smokeless tobacco: Never Used  .  Tobacco comment: patient smokes when she drives.   Substance and Sexual Activity  . Alcohol use: Yes    Alcohol/week: 0.0 standard drinks    Comment: socially  . Drug use: No  . Sexual activity: Yes    Partners: Male    Birth control/protection: None  Lifestyle  . Physical activity    Days per week: Not on file    Minutes per session: Not on file  . Stress: Not on file  Relationships  . Social Herbalist on phone: Not on file    Gets together: Not on file    Attends religious service: Not on file    Active member of club or organization: Not on file    Attends meetings of clubs or organizations: Not on file    Relationship status: Not on file  . Intimate partner violence    Fear  of current or ex partner: Not on file    Emotionally abused: Not on file    Physically abused: Not on file    Forced sexual activity: Not on file  Other Topics Concern  . Not on file  Social History Narrative   Lives with husband   Caffeine use: Drinks 2 cups coffee per day   Right handed   The PMH, PSH, Social History, Family History, Medications, and allergies have been reviewed in [facility], and have been updated if relevant.  Review of Systems     Objective:    BP 122/82   Pulse 72   Temp 97.8 F (36.6 C) (Oral)   Ht '5\' 7"'$  (1.702 m)   Wt 210 lb 6.4 oz (95.4 kg)   SpO2 96%   BMI 32.95 kg/m   Wt Readings from Last 3 Encounters:  09/07/19 210 lb 6.4 oz (95.4 kg)  08/27/19 210 lb (95.3 kg)  08/19/19 210 lb (95.3 kg)    Physical Exam   General:  Well-developed,well-nourished,in no acute distress; alert,appropriate and cooperative throughout examination Head:  normocephalic and atraumatic.   Eyes:  vision grossly intact, PERRL Ears:  R ear normal and L ear normal externally, TMs clear bilaterally Nose:  no external deformity.   Mouth:  good dentition.   Neck:  No deformities, masses, or tenderness noted. Breasts:  No mass, nodules, thickening, tenderness, bulging, retraction, inflamation, nipple discharge or skin changes noted.   Lungs:  Normal respiratory effort, chest expands symmetrically. Lungs are clear to auscultation, no crackles or wheezes. Heart:  Normal rate and regular rhythm. S1 and S2 normal without gallop, murmur, click, rub or other extra sounds. Abdomen:  Bowel sounds positive,abdomen soft and non-tender without masses, organomegaly or hernias noted. Msk:  No deformity or scoliosis noted of thoracic or lumbar spine.   Extremities:  No clubbing, cyanosis, edema, or deformity noted with normal full range of motion of all joints.   Neurologic:  alert & oriented X3 and gait normal.   Skin:  Intact without suspicious lesions or rashes Cervical Nodes:  No  lymphadenopathy noted Axillary Nodes:  No palpable lymphadenopathy Psych:  Cognition and judgment appear intact. Alert and cooperative with normal attention span and concentration. No apparent delusions, illusions, hallucinations       Assessment & Plan:   Major depressive disorder, recurrent episode, moderate (HCC)  Tobacco abuse  Essential hypertension, malignant  Coronary artery disease involving native coronary artery of native heart with unstable angina pectoris ([place])  Stenosis of carotid artery, unspecified laterality  HTN (hypertension), malignant  NSTEMI (non-ST elevated myocardial  infarction) [facility])  Renal artery stenosis (HCC)  Chronic pain syndrome  S/P CABG x 4  Vitamin D deficiency  Well woman exam without gynecological exam  History of carotid endarterectomy (Right)  Severe episode of recurrent major depressive disorder, without psychotic features ([place])  Hyperlipidemia, unspecified hyperlipidemia type  Hypercalcemia  Leukocytosis, unspecified type  Polycythemia  Diabetes mellitus due to underlying condition, uncontrolled, with circulatory complication, with long-term current use of insulin (HCC)  Hypertension, accelerated with heart disease, without CHF No follow-ups on file.

## 2019-09-06 NOTE — Assessment & Plan Note (Addendum)
Deteriorated.  I had always assumed it was from smoking and stress but I am concerned at this level, she may need phlebotomy.  Will repeat CBC c diff, add path smear and EPO.  Refer to hematology for further work up- ie rule out Polycythemia vera.. The patient indicates understanding of these issues and agrees with the plan.

## 2019-09-06 NOTE — Assessment & Plan Note (Signed)
Repeated cbc with diff and path smear today. Referred to hematology for polycythemia as well

## 2019-09-06 NOTE — Assessment & Plan Note (Addendum)
Deteriorated.   Refer to endocrinology at this point. Pt is already at such high risk of a cardiac event and diabetes is another cardiac equivalent, including her smoking, HLD, etc.  On statin and ARB.  Foot exam and pneumonia vaccine given today. The ASCVD Risk score Mikey Bussing DC Jr., et al., 2013) failed to calculate for the following reasons:   The patient has a prior MI or stroke diagnosis

## 2019-09-06 NOTE — Assessment & Plan Note (Signed)
Reviewed preventive care protocols, scheduled due services, and updated immunizations Discussed nutrition, exercise, diet, and healthy lifestyle.  Mammogram ordered and phone number for breast center given to pt to schedule her own mammogram. 

## 2019-09-06 NOTE — Assessment & Plan Note (Addendum)
Unfortunately continues to smoke despite the toll it is taking on multiple organ systems of her body but after a long discussion, she is willing to try Zyban and chantix, along with referral to tobacco abuse counseling.  Orders Placed This Encounter  Procedures  . Flu Vaccine QUAD 6+ mos PF IM (Fluarix Quad PF)  . Erythropoietin  . CBC with Differential/Platelet  . Pathologist smear review  . Vitamin D (25 hydroxy)  . Comprehensive metabolic panel  . Lipid panel  . Ambulatory referral to Hematology  . Ambulatory referral to Endocrinology  . Ambulatory referral to Smoking Cessation Program   Smoking cessation instruction/counseling given:  commended patient for quitting and reviewed strategies for preventing relapses

## 2019-09-06 NOTE — Assessment & Plan Note (Signed)
Followed and medication managed by psychiatry.

## 2019-09-06 NOTE — Assessment & Plan Note (Signed)
Managed by cardiology. BP Readings from Last 3 Encounters:  08/27/19 (!) 143/97  08/19/19 114/78  08/13/19 (!) 198/120

## 2019-09-06 NOTE — Assessment & Plan Note (Signed)
Managed by cardiology.  She is taking statin and receiving praluent injections- 150 mg every two weeks.

## 2019-09-06 NOTE — Patient Instructions (Addendum)
   Great to see you. I will call you with your lab results from today and you can view them online.    Please call the [facility] at [phone] to schedule your mammogram.   I am referring you to a hematologist and and endocrinologist.  I have sent Chantix to your pharmacy and referred for you smoking cessation counseling.

## 2019-09-07 ENCOUNTER — Encounter: Payer: Self-pay | Admitting: Family Medicine

## 2019-09-07 ENCOUNTER — Other Ambulatory Visit: Payer: Self-pay

## 2019-09-07 ENCOUNTER — Ambulatory Visit: Payer: BC Managed Care – PPO | Admitting: Family Medicine

## 2019-09-07 VITALS — BP 122/82 | HR 72 | Temp 97.8°F | Ht 67.0 in | Wt 210.4 lb

## 2019-09-07 DIAGNOSIS — I25110 Atherosclerotic heart disease of native coronary artery with unstable angina pectoris: Secondary | ICD-10-CM

## 2019-09-07 DIAGNOSIS — Z0000 Encounter for general adult medical examination without abnormal findings: Secondary | ICD-10-CM

## 2019-09-07 DIAGNOSIS — Z9889 Other specified postprocedural states: Secondary | ICD-10-CM

## 2019-09-07 DIAGNOSIS — E559 Vitamin D deficiency, unspecified: Secondary | ICD-10-CM

## 2019-09-07 DIAGNOSIS — Z794 Long term (current) use of insulin: Secondary | ICD-10-CM

## 2019-09-07 DIAGNOSIS — D72829 Elevated white blood cell count, unspecified: Secondary | ICD-10-CM

## 2019-09-07 DIAGNOSIS — D751 Secondary polycythemia: Secondary | ICD-10-CM

## 2019-09-07 DIAGNOSIS — I119 Hypertensive heart disease without heart failure: Secondary | ICD-10-CM

## 2019-09-07 DIAGNOSIS — Z23 Encounter for immunization: Secondary | ICD-10-CM | POA: Diagnosis not present

## 2019-09-07 DIAGNOSIS — F17200 Nicotine dependence, unspecified, uncomplicated: Secondary | ICD-10-CM

## 2019-09-07 DIAGNOSIS — E0865 Diabetes mellitus due to underlying condition with hyperglycemia: Secondary | ICD-10-CM

## 2019-09-07 DIAGNOSIS — IMO0002 Reserved for concepts with insufficient information to code with codable children: Secondary | ICD-10-CM

## 2019-09-07 DIAGNOSIS — I6529 Occlusion and stenosis of unspecified carotid artery: Secondary | ICD-10-CM

## 2019-09-07 DIAGNOSIS — I10 Essential (primary) hypertension: Secondary | ICD-10-CM

## 2019-09-07 DIAGNOSIS — Z720 Tobacco use: Secondary | ICD-10-CM | POA: Diagnosis not present

## 2019-09-07 DIAGNOSIS — F331 Major depressive disorder, recurrent, moderate: Secondary | ICD-10-CM

## 2019-09-07 DIAGNOSIS — I701 Atherosclerosis of renal artery: Secondary | ICD-10-CM

## 2019-09-07 DIAGNOSIS — F332 Major depressive disorder, recurrent severe without psychotic features: Secondary | ICD-10-CM | POA: Diagnosis not present

## 2019-09-07 DIAGNOSIS — E785 Hyperlipidemia, unspecified: Secondary | ICD-10-CM

## 2019-09-07 DIAGNOSIS — Z951 Presence of aortocoronary bypass graft: Secondary | ICD-10-CM

## 2019-09-07 DIAGNOSIS — E0859 Diabetes mellitus due to underlying condition with other circulatory complications: Secondary | ICD-10-CM

## 2019-09-07 DIAGNOSIS — I214 Non-ST elevation (NSTEMI) myocardial infarction: Secondary | ICD-10-CM

## 2019-09-07 DIAGNOSIS — G894 Chronic pain syndrome: Secondary | ICD-10-CM

## 2019-09-07 LAB — COMPREHENSIVE METABOLIC PANEL
ALT: 23 U/L (ref 0–35)
AST: 16 U/L (ref 0–37)
Albumin: 4.1 g/dL (ref 3.5–5.2)
Alkaline Phosphatase: 73 U/L (ref 39–117)
BUN: 9 mg/dL (ref 6–23)
CO2: 26 mEq/L (ref 19–32)
Calcium: 10 mg/dL (ref 8.4–10.5)
Chloride: 103 mEq/L (ref 96–112)
Creatinine, Ser: 0.5 mg/dL (ref 0.40–1.20)
GFR: 132.97 mL/min (ref >60.00)
Glucose, Bld: 87 mg/dL (ref 70–99)
Potassium: 3.8 mEq/L (ref 3.5–5.1)
Sodium: 137 mEq/L (ref 135–145)
Total Bilirubin: 0.4 mg/dL (ref 0.2–1.2)
Total Protein: 7 g/dL (ref 6.0–8.3)

## 2019-09-07 LAB — CBC WITH DIFFERENTIAL/PLATELET
Basophils Absolute: 0.1 10*3/uL (ref 0.0–0.1)
Basophils Relative: 0.6 % (ref 0.0–3.0)
Eosinophils Absolute: 0.3 10*3/uL (ref 0.0–0.7)
Eosinophils Relative: 1.7 % (ref 0.0–5.0)
HCT: 44.7 % (ref 36.0–46.0)
Hemoglobin: 15 g/dL (ref 12.0–15.0)
Lymphocytes Relative: 26.4 % (ref 12.0–46.0)
Lymphs Abs: 3.9 10*3/uL (ref 0.7–4.0)
MCHC: 33.6 g/dL (ref 30.0–36.0)
MCV: 90.5 fl (ref 78.0–100.0)
Monocytes Absolute: 0.8 10*3/uL (ref 0.1–1.0)
Monocytes Relative: 5.2 % (ref 3.0–12.0)
Neutro Abs: 9.7 10*3/uL — ABNORMAL HIGH (ref 1.4–7.7)
Neutrophils Relative %: 66.1 % (ref 43.0–77.0)
Platelets: 437 10*3/uL — ABNORMAL HIGH (ref 150.0–400.0)
RBC: 4.93 Mil/uL (ref 3.87–5.11)
RDW: 14 % (ref 11.5–15.5)
WBC: 14.6 10*3/uL — ABNORMAL HIGH (ref 4.0–10.5)

## 2019-09-07 LAB — LIPID PANEL
Cholesterol: 157 mg/dL (ref 0–200)
HDL: 38.7 mg/dL — ABNORMAL LOW (ref >39.00)
LDL Cholesterol: 80 mg/dL (ref 0–99)
NonHDL: 118.32
Total CHOL/HDL Ratio: 4
Triglycerides: 191 mg/dL — ABNORMAL HIGH (ref 0.0–149.0)
VLDL: 38.2 mg/dL (ref 0.0–40.0)

## 2019-09-07 LAB — VITAMIN D 25 HYDROXY (VIT D DEFICIENCY, FRACTURES): VITD: 28.26 ng/mL — ABNORMAL LOW (ref 30.00–100.00)

## 2019-09-07 MED ORDER — INSULIN LISPRO (1 UNIT DIAL) 100 UNIT/ML (KWIKPEN): PEN_INJECTOR | SUBCUTANEOUS | 11 refills | Status: DC

## 2019-09-07 MED ORDER — NICOTINE 14 MG/24HR TD PT24: 14.0000 mg | MEDICATED_PATCH | TRANSDERMAL | 0 refills | Status: DC

## 2019-09-07 MED ORDER — NICOTINE 14 MG/24HR TD PT24: 14.0000 mg | MEDICATED_PATCH | Freq: Every day | TRANSDERMAL | 0 refills | Status: DC

## 2019-09-07 NOTE — Assessment & Plan Note (Signed)
On med[facility] management now.

## 2019-09-08 ENCOUNTER — Other Ambulatory Visit: Payer: Self-pay | Admitting: Family Medicine

## 2019-09-09 ENCOUNTER — Encounter: Payer: Self-pay | Admitting: Family Medicine

## 2019-09-09 DIAGNOSIS — M542 Cervicalgia: Secondary | ICD-10-CM | POA: Diagnosis not present

## 2019-09-09 DIAGNOSIS — M545 Low back pain: Secondary | ICD-10-CM | POA: Diagnosis not present

## 2019-09-09 DIAGNOSIS — M9901 Segmental and somatic dysfunction of cervical region: Secondary | ICD-10-CM | POA: Diagnosis not present

## 2019-09-09 DIAGNOSIS — M9903 Segmental and somatic dysfunction of lumbar region: Secondary | ICD-10-CM | POA: Diagnosis not present

## 2019-09-09 LAB — PATHOLOGIST SMEAR REVIEW

## 2019-09-09 LAB — ERYTHROPOIETIN: Erythropoietin: 10.9 m[IU]/mL (ref 2.6–18.5)

## 2019-09-10 ENCOUNTER — Telehealth: Payer: Self-pay

## 2019-09-10 NOTE — Telephone Encounter (Signed)
Attempted to call patient, no answer. Left message to call us back for information on mondays visit.

## 2019-09-13 ENCOUNTER — Other Ambulatory Visit (HOSPITAL_COMMUNITY): Payer: Self-pay | Admitting: Psychiatry

## 2019-09-13 DIAGNOSIS — F331 Major depressive disorder, recurrent, moderate: Secondary | ICD-10-CM

## 2019-09-13 DIAGNOSIS — F4310 Post-traumatic stress disorder, unspecified: Secondary | ICD-10-CM

## 2019-09-13 NOTE — Progress Notes (Signed)
Pain Management Virtual Encounter Note - Virtual Visit via Telephone Telehealth (real-time audio visits between healthcare provider and patient).   Patient's Phone No. & Preferred Pharmacy:  [phone] (home); [phone] (mobile); (Preferred) [phone] [email or web address]  [facility][id number] [name], [facility] [address] [place] [address] Phone: [phone] Fax: [phone]    Pre-screening note:  Our staff contacted Erika [name] and offered her an "in person", "face-to-face" appointment versus a telephone encounter. She indicated preferring the telephone encounter, at this time.   Reason for Virtual Visit: COVID-19*  Social distancing based on CDC and AMA recommendations.   I contacted Erika [name] on 09/14/2019 via telephone.      I clearly identified myself as Gaspar Cola, MD. I verified that I was speaking with the correct person using two identifiers (Name: Erika [name], and date of birth: [birth year]).  Advanced Informed Consent I sought verbal advanced consent from Erika [name] for virtual visit interactions. I informed Erika [name] of possible security and privacy concerns, risks, and limitations associated with providing "not-in-person" medical evaluation and management services. I also informed Erika [name] of the availability of "in-person" appointments. Finally, I informed her that there would be a charge for the virtual visit and that she could be  personally, fully or partially, financially responsible for it. Erika [name] expressed understanding and agreed to proceed.   Historic Elements   Erika [name] is a 48 y.o. year old, female patient evaluated today after her last encounter by our practice on 09/10/2019. Ms. Primo  [name] a past medical history of Arthralgia of temporomandibular joint, CAD, multiple vessel, Carotid arterial disease ([place]), Depression, Diastolic dysfunction, Fatty liver  disease, nonalcoholic ([id number]), HLD (hyperlipidemia), Labile hypertension, Obesity, PTSD (post-traumatic stress disorder), Tobacco abuse, and Type 2 diabetes mellitus [facility] (January 2017). She also  [name] a past surgical history that includes Cholecystectomy; Tonsillectomy; Cesarean section; Cardiac catheterization ([name], 04/18/2016); Cardiac catheterization ([name], 06/29/2016); Coronary artery bypass graft ([name], 07/06/2016); TEE without cardioversion ([name], 07/06/2016); Endarterectomy (Right, 07/06/2016); Cardiac catheterization ([name], 08/29/2016); ABDOMINAL AORTOGRAM W/LOWER EXTREMITY ([name], 10/15/2018); PERIPHERAL VASCULAR INTERVENTION (Left, 10/15/2018); and ABDOMINAL AORTOGRAM W/LOWER EXTREMITY (Bilateral, 08/19/2019). Erika [name] a current medication list which includes the following prescription(s): albuterol, alirocumab, amlodipine, aspirin ec, benztropine, carvedilol, chlorpromazine, cilostazol, clonazepam, clonidine, cyclobenzaprine, fluoxetine hcl, insulin glargine, insulin lispro, insulin syringe-needle u-100, isosorbide mononitrate, lamotrigine, losartan, magic mouthwash, naloxone, nicotine, nitroglycerin, ondansetron, rosuvastatin, spironolactone, calcium carbonate, vitamin d3, magnesium, oxycodone, oxycodone, oxycodone, and pregabalin. She  reports that she [name] been smoking cigarettes. She [name] a 27.00 pack-year smoking history. She [name] never used smokeless tobacco. She reports current alcohol use. She reports that she does not use drugs. Erika [name] is allergic to chantix [varenicline tartrate].   HPI  Today, she is being contacted for both, medication management and a post-procedure assessment.  The patient indicates doing well with the current medication regimen. No adverse reactions or side effects reported to the medications.  We will switch her from the Lyrica 150 mg 3 times a day to the 225 mg twice a day.  In addition, we will arrange for her to have a physical therapy referral to see if they can work  on some of this left gluteal tendinopathy.  Post-Procedure Evaluation  Procedure: Diagnostic left hip gluteal tendon insertion bursa Injection #1 under fluoroscopic and ultrasound guidance and IV sedation Pre-procedure pain level:  4/10 Post-procedure: 0/10 (100% relief)  Sedation: Sedation provided.  Effectiveness during initial hour after procedure(Ultra-Short Term Relief):  100 %   Local anesthetic used: Long-acting (4-6 hours) Effectiveness: Defined as any analgesic benefit obtained secondary to the administration of local anesthetics. This carries significant diagnostic value as to the etiological location, or anatomical origin, of the pain. Duration of benefit is expected to coincide with the duration of the local anesthetic used.  Effectiveness during initial 4-6 hours after procedure(Short-Term Relief): 100 %   Long-term benefit: Defined as any relief past the pharmacologic duration of the local anesthetics.  Effectiveness past the initial 6 hours after procedure(Long-Term Relief): 75 %(approx 1 1/2 weeks relief then pain began to return after walking or sitting too long.)   Current benefits: Defined as benefit that persist at this time.   Analgesia:  50% improved Function: Somewhat improved ROM: Somewhat improved  Pharmacotherapy Assessment  Analgesic: Oxycodone IR 5 mg. 1 tab PO BID (10 mg/day of oxycodone) (enough to last to 09/30/2019) MME/day: 15 mg/day.   Monitoring: Pharmacotherapy: No side-effects or adverse reactions reported. [place] PMP: PDMP reviewed during this encounter.       Compliance: No problems identified. Effectiveness: Clinically acceptable. Plan: Refer to "POC".  UDS:  Summary  Date Value Ref Range Status  01/07/2019 FINAL  Final    Comment:    ==================================================================== TOXASSURE COMP DRUG ANALYSIS,UR ==================================================================== Test                             Result        Flag       Units Drug Present and Declared for Prescription Verification   7-aminoclonazepam              115          EXPECTED   ng/mg creat    7-aminoclonazepam is an expected metabolite of clonazepam. Source    of clonazepam is a scheduled prescription medication.   Hydrocodone                    732          EXPECTED   ng/mg creat   Hydromorphone                  158          EXPECTED   ng/mg creat   Dihydrocodeine                 212          EXPECTED   ng/mg creat   Norhydrocodone                 1339         EXPECTED   ng/mg creat    Sources of hydrocodone include scheduled prescription    medications. Hydromorphone, dihydrocodeine and norhydrocodone are    expected metabolites of hydrocodone. Hydromorphone and    dihydrocodeine are also available as scheduled prescription    medications.   Cyclobenzaprine                PRESENT      EXPECTED   Desmethylcyclobenzaprine       PRESENT      EXPECTED    Desmethylcyclobenzaprine is an expected metabolite of    cyclobenzaprine.   Chlorpromazine                 PRESENT      EXPECTED   Acetaminophen  PRESENT      EXPECTED Drug Absent but Declared for Prescription Verification   Lamotrigine                    Not Detected UNEXPECTED   Fluoxetine                     Not Detected UNEXPECTED   Salicylate                     Not Detected UNEXPECTED    Aspirin, as indicated in the declared medication list, is not    always detected even when used as directed.   Benztropine                    Not Detected UNEXPECTED ==================================================================== Test                      Result    Flag   Units      Ref Range   Creatinine              165              mg/dL      >=20 ==================================================================== Declared Medications:  The flagging and interpretation on this report are based on the  following declared medications.  Unexpected results may arise  from  inaccuracies in the declared medications.  **Note: The testing scope of this panel includes these medications:  Benztropine (Cogentin)  Chlorpromazine (Thorazine)  Clonazepam (Klonopin)  Cyclobenzaprine (Fexmid)  Fluoxetine  Hydrocodone (Norco)  Lamotrigine (Lamictal)  **Note: The testing scope of this panel does not include small to  moderate amounts of these reported medications:  Acetaminophen (Norco)  Aspirin (Aspirin 81)  **Note: The testing scope of this panel does not include following  reported medications:  Albuterol  Alirocumab  Carvedilol (Coreg)  Clopidogrel (Plavix)  Insulin  Insulin (Lantus)  Isosorbide (Imdur)  Losartan (Cozaar)  Mouthwash (Magic Mouthwash)  Naloxone (Narcan)  Nitroglycerin (Nitrostat)  Ondansetron (Zofran)  Rosuvastatin (Crestor)  Spironolactone (Aldactone)  Triamcinolone (Kenalog) ==================================================================== For clinical consultation, please call [phone]. ====================================================================    Laboratory Chemistry Profile (12 mo)  Renal: 08/13/2019: BUN/Creatinine Ratio 12 09/07/2019: BUN 9; Creatinine, Ser 0.50  Lab Results  Component Value Date   GFR 132.97 09/07/2019   GFRAA 122 08/13/2019   GFRNONAA 106 08/13/2019   Hepatic: 09/07/2019: Albumin 4.1 Lab Results  Component Value Date   AST 16 09/07/2019   ALT 23 09/07/2019   Other: 01/07/2019: 25-Hydroxy, Vitamin D 16; 25-Hydroxy, Vitamin D-2 <1.0; 25-Hydroxy, Vitamin D-3 15; CRP 15; Vitamin B-12 476 03/17/2019: Sed Rate 17 09/07/2019: VITD 28.26 Note: Above Lab results reviewed.  Imaging  Last 90 days:  Mr Hip Left Wo Contrast  Result Date: 08/13/2019 CLINICAL DATA:  Left hip severe pain with walking and sitting EXAM: MR OF THE LEFT HIP WITHOUT CONTRAST TECHNIQUE: Multiplanar, multisequence MR imaging was performed. No intravenous contrast was administered. COMPARISON:  05/28/2019 FINDINGS:  Bones: There is no evidence of acute fracture, dislocation or avascular necrosis. The femoral head is normal in shape, configuration and sphericity. No osseous bump at the femoral head neck junction. The visualized bony pelvis appears normal. The visualized sacroiliac joints and symphysis pubis appear normal. Articular cartilage and labrum Articular cartilage: There is mild superior chondral thinning with a small marginal osteophyte seen at the superior femoral head. Labrum: There is no gross labral tear or paralabral abnormality. Joint  or bursal effusion Joint effusion: No significant hip joint effusion. Bursae: No focal periarticular fluid collection. Muscles and tendons Muscles and tendons: Increased signal seen at the insertion site of the gluteal tendons. The hamstrings and iliopsoas tendons are intact. Other findings Miscellaneous: The visualized internal pelvic contents appear unremarkable. IMPRESSION: Mild right hip osteoarthritis. Insertional gluteal tendinosis. Electronically Signed   By: Prudencio Pair M.D.   On: 08/13/2019 13:43   [facility] C-arm 1-60 Min No Report  Result Date: 08/27/2019 Fluoro was used, but no Radiologist interpretation will be provided. Please refer to "NOTES" tab for provider progress note.   Assessment  The primary encounter diagnosis was Chronic pain syndrome. Diagnoses of Chronic headaches (Primary Area of Pain) (Right), Neurogenic pain, Vitamin D deficiency, Chronic hip pain (Left), and Gluteal tendonitis of buttock (Left) were also pertinent to this visit.  Plan of Care  I have changed Tinzlee M. Pilkenton's pregabalin. I am also having her start on oxyCODONE and oxyCODONE. Additionally, I am having her maintain her Insulin Syringe-Needle U-100, aspirin EC, rosuvastatin, isosorbide mononitrate, magic mouthwash, naloxone, Insulin Glargine, nitroGLYCERIN, albuterol, cyclobenzaprine, spironolactone, Alirocumab, ondansetron, losartan, amLODipine, cilostazol,  lamoTRIgine, FLUoxetine HCl, clonazePAM, chlorproMAZINE, benztropine, cloNIDine, carvedilol, insulin lispro, nicotine, oxyCODONE, Magnesium, Vitamin D3, and calcium carbonate.  Pharmacotherapy (Medications Ordered): Meds ordered this encounter  Medications  . pregabalin (LYRICA) 225 MG capsule    Sig: Take 1 capsule (225 mg total) by mouth 2 (two) times daily. Take 1 capsule (150 mg total) 3 (three) times daily.    Dispense:  60 capsule    Refill:  2    Fill one day early if pharmacy is closed on scheduled refill date. May substitute for generic if available.  Marland Kitchen oxyCODONE (OXY IR/ROXICODONE) 5 MG immediate release tablet    Sig: Take 1 tablet (5 mg total) by mouth 2 (two) times daily as needed for severe pain. Must last 30 days.    Dispense:  60 tablet    Refill:  0    Chronic Pain: STOP Act (Not applicable) Fill 1 day early if closed on refill date. Do not fill until: 09/30/2019. To last until: 10/30/2019. Avoid benzodiazepines within 8 hours of opioids  . Magnesium 500 MG CAPS    Sig: Take 1 capsule (500 mg total) by mouth 2 (two) times daily at 8 am and 10 pm.    Dispense:  180 capsule    Refill:  0    Fill one day early if pharmacy is closed on scheduled refill date. May substitute for generic if available.  . Cholecalciferol (VITAMIN D3) 125 MCG (5000 UT) CAPS    Sig: Take 1 capsule (5,000 Units total) by mouth daily with breakfast. Take along with calcium and magnesium.    Dispense:  90 capsule    Refill:  0    Fill one day early if pharmacy is closed on scheduled refill date. May substitute for generic if available.  . calcium carbonate (CALCIUM 600) 600 MG TABS tablet    Sig: Take 1 tablet (600 mg total) by mouth 2 (two) times daily with a meal.    Dispense:  180 tablet    Refill:  0    Fill one day early if pharmacy is closed on scheduled refill date. May substitute for generic if available.  Marland Kitchen oxyCODONE (OXY IR/ROXICODONE) 5 MG immediate release tablet    Sig: Take 1 tablet (5  mg total) by mouth 2 (two) times daily as needed for severe pain. Must last  30 days.    Dispense:  60 tablet    Refill:  0    Chronic Pain: STOP Act (Not applicable) Fill 1 day early if closed on refill date. Do not fill until: 10/30/2019. To last until: 11/29/2019. Avoid benzodiazepines within 8 hours of opioids  . oxyCODONE (OXY IR/ROXICODONE) 5 MG immediate release tablet    Sig: Take 1 tablet (5 mg total) by mouth 2 (two) times daily as needed for severe pain. Must last 30 days.    Dispense:  60 tablet    Refill:  0    Chronic Pain: STOP Act (Not applicable) Fill 1 day early if closed on refill date. Do not fill until: 11/29/2019. To last until: 12/29/2019. Avoid benzodiazepines within 8 hours of opioids   Orders:  Orders Placed This Encounter  Procedures  . Ambulatory referral to Physical Therapy    Referral Priority:   Routine    Referral Type:   Physical Medicine    Referral Reason:   Specialty Services Required    Requested Specialty:   Physical Therapy    Number of Visits Requested:   1   Follow-up plan:   Return in about 15 weeks (around 12/28/2019) for (VV), (MM).      Interventional management options: Considering:   NOTE: PLAVIX ANTICOAGULATION(Stop:7-10 days pre-procedure; Restart:2 hours post-proc.) Diagnostic right sided facial nerve block Diagnostic right-sided geniculate nerve block  Diagnostic right-sided occipital nerve block  Possible right-sided occipital nerveRFA Diagnostic Botox injections   Palliative PRN treatment(s):   Diagnostic left peri-insertional gluteal tendon injection #2  Diagnostic Facial (CN VII) nerve block  #4  Diagnostic right facial/Geniculatenerve block #3        Recent Visits Date Type Provider Dept  08/27/19 Procedure visit Milinda Pointer, MD [facility]  08/12/19 Office Visit Milinda Pointer, MD [facility]  07/15/19 Office Visit Milinda Pointer, MD [facility]  06/29/19 Office Visit  Milinda Pointer, MD [facility]  Showing recent visits within past 90 days and meeting all other requirements   Today's Visits Date Type Provider Dept  09/14/19 Office Visit Milinda Pointer, MD [facility]  Showing today's visits and meeting all other requirements   Future Appointments Date Type Provider Dept  09/30/19 Appointment Milinda Pointer, MD [facility]  Showing future appointments within next 90 days and meeting all other requirements   I discussed the assessment and treatment plan with the patient. The patient was provided an opportunity to ask questions and all were answered. The patient agreed with the plan and demonstrated an understanding of the instructions.  Patient advised to call back or seek an in-person evaluation if the symptoms or condition worsens.  Total duration of non-face-to-face encounter: 15 minutes.  Note by: Gaspar Cola, MD Date: 09/14/2019; Time: 2:54 PM  Note: This dictation was prepared with Dragon dictation. Any transcriptional errors that may result from this process are unintentional.  Disclaimer:  * Given the special circumstances of the COVID-19 pandemic, the federal government [name] announced that the Office for Civil Rights (OCR) will exercise its enforcement discretion and will not impose penalties on physicians using telehealth in the event of noncompliance with regulatory requirements under the [place] (HIPAA) in connection with the good faith provision of telehealth during the XX123456 national public health emergency. ([place])

## 2019-09-14 ENCOUNTER — Ambulatory Visit: Payer: BC Managed Care – PPO | Attending: Pain Medicine | Admitting: Pain Medicine

## 2019-09-14 ENCOUNTER — Other Ambulatory Visit: Payer: Self-pay

## 2019-09-14 ENCOUNTER — Encounter: Payer: Self-pay | Admitting: Pain Medicine

## 2019-09-14 DIAGNOSIS — M25552 Pain in left hip: Secondary | ICD-10-CM

## 2019-09-14 DIAGNOSIS — R51 Headache: Secondary | ICD-10-CM

## 2019-09-14 DIAGNOSIS — G8929 Other chronic pain: Secondary | ICD-10-CM

## 2019-09-14 DIAGNOSIS — M7602 Gluteal tendinitis, left hip: Secondary | ICD-10-CM

## 2019-09-14 DIAGNOSIS — E559 Vitamin D deficiency, unspecified: Secondary | ICD-10-CM

## 2019-09-14 DIAGNOSIS — G894 Chronic pain syndrome: Secondary | ICD-10-CM | POA: Diagnosis not present

## 2019-09-14 DIAGNOSIS — M792 Neuralgia and neuritis, unspecified: Secondary | ICD-10-CM | POA: Diagnosis not present

## 2019-09-14 MED ORDER — VITAMIN D3 125 MCG (5000 UT) PO CAPS: 1.0000 | ORAL_CAPSULE | Freq: Every day | ORAL | 0 refills | Status: DC

## 2019-09-14 MED ORDER — PREGABALIN 225 MG PO CAPS: 225.0000 mg | ORAL_CAPSULE | Freq: Two times a day (BID) | ORAL | 2 refills | Status: DC

## 2019-09-14 MED ORDER — OXYCODONE HCL 5 MG PO TABS: 5.0000 mg | ORAL_TABLET | Freq: Two times a day (BID) | ORAL | 0 refills | Status: DC | PRN

## 2019-09-14 MED ORDER — MAGNESIUM 500 MG PO CAPS: 500.0000 mg | ORAL_CAPSULE | Freq: Two times a day (BID) | ORAL | 0 refills | Status: DC

## 2019-09-14 MED ORDER — CALCIUM CARBONATE 600 MG PO TABS: 600.0000 mg | ORAL_TABLET | Freq: Two times a day (BID) | ORAL | 0 refills | Status: DC

## 2019-09-15 ENCOUNTER — Other Ambulatory Visit: Payer: Self-pay

## 2019-09-15 ENCOUNTER — Inpatient Hospital Stay: Payer: BC Managed Care – PPO

## 2019-09-15 ENCOUNTER — Encounter: Payer: Self-pay | Admitting: Cardiovascular Disease

## 2019-09-15 ENCOUNTER — Ambulatory Visit (INDEPENDENT_AMBULATORY_CARE_PROVIDER_SITE_OTHER): Payer: BC Managed Care – PPO | Admitting: Cardiovascular Disease

## 2019-09-15 ENCOUNTER — Encounter: Payer: Self-pay | Admitting: Oncology

## 2019-09-15 ENCOUNTER — Inpatient Hospital Stay: Payer: BC Managed Care – PPO | Attending: Oncology | Admitting: Oncology

## 2019-09-15 VITALS — BP 179/113 | Temp 97.8°F | Resp 16 | Wt 206.4 lb

## 2019-09-15 VITALS — BP 192/109 | HR 111 | Temp 97.3°F | Ht 67.0 in | Wt 209.0 lb

## 2019-09-15 DIAGNOSIS — I739 Peripheral vascular disease, unspecified: Secondary | ICD-10-CM | POA: Diagnosis not present

## 2019-09-15 DIAGNOSIS — Z794 Long term (current) use of insulin: Secondary | ICD-10-CM | POA: Insufficient documentation

## 2019-09-15 DIAGNOSIS — I10 Essential (primary) hypertension: Secondary | ICD-10-CM | POA: Diagnosis not present

## 2019-09-15 DIAGNOSIS — I2510 Atherosclerotic heart disease of native coronary artery without angina pectoris: Secondary | ICD-10-CM | POA: Insufficient documentation

## 2019-09-15 DIAGNOSIS — R5383 Other fatigue: Secondary | ICD-10-CM | POA: Insufficient documentation

## 2019-09-15 DIAGNOSIS — I779 Disorder of arteries and arterioles, unspecified: Secondary | ICD-10-CM

## 2019-09-15 DIAGNOSIS — Z720 Tobacco use: Secondary | ICD-10-CM

## 2019-09-15 DIAGNOSIS — E669 Obesity, unspecified: Secondary | ICD-10-CM | POA: Diagnosis not present

## 2019-09-15 DIAGNOSIS — Z79899 Other long term (current) drug therapy: Secondary | ICD-10-CM | POA: Insufficient documentation

## 2019-09-15 DIAGNOSIS — D751 Secondary polycythemia: Secondary | ICD-10-CM | POA: Insufficient documentation

## 2019-09-15 DIAGNOSIS — D75839 Thrombocytosis, unspecified: Secondary | ICD-10-CM

## 2019-09-15 DIAGNOSIS — E785 Hyperlipidemia, unspecified: Secondary | ICD-10-CM | POA: Diagnosis not present

## 2019-09-15 DIAGNOSIS — D473 Essential (hemorrhagic) thrombocythemia: Secondary | ICD-10-CM | POA: Diagnosis not present

## 2019-09-15 DIAGNOSIS — D729 Disorder of white blood cells, unspecified: Secondary | ICD-10-CM

## 2019-09-15 DIAGNOSIS — D72829 Elevated white blood cell count, unspecified: Secondary | ICD-10-CM | POA: Insufficient documentation

## 2019-09-15 DIAGNOSIS — F17210 Nicotine dependence, cigarettes, uncomplicated: Secondary | ICD-10-CM | POA: Insufficient documentation

## 2019-09-15 LAB — CBC WITH DIFFERENTIAL/PLATELET
Abs Immature Granulocytes: 0 10*3/uL (ref 0.00–0.07)
Band Neutrophils: 2 %
Basophils Absolute: 0 10*3/uL (ref 0.0–0.1)
Basophils Relative: 0 %
Eosinophils Absolute: 0.6 10*3/uL — ABNORMAL HIGH (ref 0.0–0.5)
Eosinophils Relative: 4 %
HCT: 48.6 % — ABNORMAL HIGH (ref 36.0–46.0)
Hemoglobin: 16.7 g/dL — ABNORMAL HIGH (ref 12.0–15.0)
Lymphocytes Relative: 25 %
Lymphs Abs: 3.9 10*3/uL (ref 0.7–4.0)
MCH: 30.2 pg (ref 26.0–34.0)
MCHC: 34.4 g/dL (ref 30.0–36.0)
MCV: 87.9 fL (ref 80.0–100.0)
Monocytes Absolute: 0.5 10*3/uL (ref 0.1–1.0)
Monocytes Relative: 3 %
Neutro Abs: 10.5 10*3/uL — ABNORMAL HIGH (ref 1.7–7.7)
Neutrophils Relative %: 66 %
Platelets: 453 10*3/uL — ABNORMAL HIGH (ref 150–400)
RBC: 5.53 MIL/uL — ABNORMAL HIGH (ref 3.87–5.11)
RDW: 13.1 % (ref 11.5–15.5)
Smear Review: NORMAL
WBC: 15.4 10*3/uL — ABNORMAL HIGH (ref 4.0–10.5)
nRBC: 0 % (ref 0.0–0.2)

## 2019-09-15 LAB — IRON AND TIBC
Iron: 113 ug/dL (ref 28–170)
Saturation Ratios: 27 % (ref 10.4–31.8)
TIBC: 416 ug/dL (ref 250–450)
UIBC: 303 ug/dL

## 2019-09-15 LAB — FERRITIN: Ferritin: 54 ng/mL (ref 11–307)

## 2019-09-15 LAB — TSH: TSH: 1.693 u[IU]/mL (ref 0.350–4.500)

## 2019-09-15 LAB — RETIC PANEL
Immature Retic Fract: 3.9 % (ref 2.3–15.9)
RBC.: 5.53 MIL/uL — ABNORMAL HIGH (ref 3.87–5.11)
Retic Count, Absolute: 111.7 10*3/uL (ref 19.0–186.0)
Retic Ct Pct: 2 % (ref 0.4–3.1)
Reticulocyte Hemoglobin: 35.5 pg (ref >27.9)

## 2019-09-15 NOTE — Progress Notes (Signed)
Cardiology Office Note   Date:  09/15/2019   ID:  Erika [name], Erika [name] Jul 24, 1971, MRN [id number]  PCP:  Lucille Passy, MD Cardiologist:   [name], MD   Chief Complaint  Patient presents with  . other    1 month follow up. Meds reviewed by the pt. verbally. "doing well."       History of Present Illness: Erika [name] is a 48 y.o. female who presents for a follow-up visit  regarding extensive cardiovascular history. She has known history of coronary artery disease with previous non-ST elevation myocardial infarction in 2017.  She was found to have three-vessel coronary artery disease at that time and underwent CABG and right carotid endarterectomy. She was rehospitalized in September, 2017 with chest pain in the setting of uncontrolled hypertension. Cardiac catheterization showed patent grafts . The LAD had diffuse disease distal to the anastomosis and was very small in caliber. Ejection fraction was normal with mildly elevated left ventricular end-diastolic pressure.   She has history of difficult to control hypertension with labile blood pressure.  No evidence of renal artery stenosis on previous angiography.  She has known history of severe mixed hyperlipidemia .  She is known to have peripheral arterial disease with previous stenting of the left SFA.  She was seen recently for worsening left calf claudication with a drop in her ABI. I proceeded with angiography in August which showed no significant aortoiliac disease.  The stent in the left SFA was patent.  However, the whole proximal portion of the SFA was diffusely diseased with short occlusion distal to the stent.  In addition, the anterior tibial was diffusely diseased and occluded distally.  The peroneal artery was also diffusely diseased.  No revascularization was performed.  She reports stable left calf claudication.  She is working on smoking cessation and she has a quit date of October 1.  She is using a  nicotine patch. She did not tolerate hydralazine due to GI symptoms.  She was started on clonidine.  She is on multiple blood pressure medications and her blood pressure continues to fluctuate significantly.   Past Medical History:  Diagnosis Date  . Arthralgia of temporomandibular joint   . CAD, multiple vessel    a. cath 06/29/16: ostLM 40%, ostLAD 40%, pLAD 95%, ost-pLCx 60%, pLCx 95%, mLCx 60%, mRCA 95%, D2 50%, LVSF nl;  b. 07/2016 CABG x 4 (LIMA->LAD, VG->Diag, VG->OM, VG->RCA); c. 08/2016 Cath: 3VD w/ 4/4 patent grafts. LAD distal to LIMA has diff dzs->Med rx.  . Carotid arterial disease ([place])    a. 07/2016 s/p R CEA.  . Clotting disorder ([place])   . Depression   . Diastolic dysfunction    a. echo 06/28/16: EF 50-55%, mild inf wall HK, GR1DD, mild MR, RV sys fxn nl, mildly dilated LA, PASP nl  . Fatty liver disease, nonalcoholic 4010  . HLD (hyperlipidemia)   . Labile hypertension    a. prior renal ngiogram negative for RAS in 03/2016; b. catecholamines and metanephrines normal, mildly elevated renin with normal aldosterone and normal ratio in 02/2016  . Obesity   . PTSD (post-traumatic stress disorder)   . Tobacco abuse    a. 2018 - cut back from 2 ppd to 0.5 ppd.  . Type 2 diabetes mellitus [facility]) January 2017    Past Surgical History:  Procedure Laterality Date  . ABDOMINAL AORTOGRAM W/LOWER EXTREMITY [name] 10/15/2018   Procedure: ABDOMINAL AORTOGRAM W/LOWER EXTREMITY;  Surgeon: Wellington Hampshire, MD;  Location: [place] [facility];  Service: Cardiovascular;  Laterality: [name];  . ABDOMINAL AORTOGRAM W/LOWER EXTREMITY Bilateral 08/19/2019   Procedure: ABDOMINAL AORTOGRAM W/LOWER EXTREMITY;  Surgeon: Wellington Hampshire, MD;  Location: [place] [facility];  Service: Cardiovascular;  Laterality: Bilateral;  . CARDIAC CATHETERIZATION [name] 06/29/2016   Procedure: Left Heart Cath and Coronary Angiography;  Surgeon: Minna Merritts, MD;  Location: [place] [facility];  Service: Cardiovascular;   Laterality: [name];  . CARDIAC CATHETERIZATION [name] 08/29/2016   Procedure: Left Heart Cath and Cors/Grafts Angiography;  Surgeon: Wellington Hampshire, MD;  Location: [place] [facility];  Service: Cardiovascular;  Laterality: [name];  . CESAREAN SECTION    . CHOLECYSTECTOMY    . CORONARY ARTERY BYPASS GRAFT [name] 07/06/2016   Procedure: CORONARY ARTERY BYPASS GRAFTING (CABG) x four, using left internal mammary artery and right leg greater saphenous vein harvested endoscopically;  Surgeon: Ivin Poot, MD;  Location: [place];  Service: Open Heart Surgery;  Laterality: [name];  . ENDARTERECTOMY Right 07/06/2016   Procedure: ENDARTERECTOMY CAROTID;  Surgeon: Rosetta Posner, MD;  Location: [place];  Service: Vascular;  Laterality: Right;  . PERIPHERAL VASCULAR CATHETERIZATION [name] 04/18/2016   Procedure: Renal Angiography;  Surgeon: Wellington Hampshire, MD;  Location: [place] [facility];  Service: Cardiovascular;  Laterality: [name];  . PERIPHERAL VASCULAR INTERVENTION Left 10/15/2018   Procedure: PERIPHERAL VASCULAR INTERVENTION;  Surgeon: Wellington Hampshire, MD;  Location: [place] [facility];  Service: Cardiovascular;  Laterality: Left;  Left superficial femoral  . TEE WITHOUT CARDIOVERSION [name] 07/06/2016   Procedure: TRANSESOPHAGEAL ECHOCARDIOGRAM (TEE);  Surgeon: Ivin Poot, MD;  Location: [place];  Service: Open Heart Surgery;  Laterality: [name];  . TONSILLECTOMY       Current Outpatient Medications  Medication Sig Dispense Refill  . albuterol (PROVENTIL HFA;VENTOLIN HFA) 108 (90 Base) MCG/ACT inhaler Inhale 2 puffs into the lungs every 6 (six) hours as needed for wheezing. 1 Inhaler 0  . Alirocumab (PRALUENT) 150 MG/ML SOAJ Inject 150 mg into the skin every 14 (fourteen) days. (Patient taking differently: Inject 150 mg into the skin every 14 (fourteen) days. Last dose 3 week ago) 2 pen 11  . amLODipine (NORVASC) 10 MG tablet Take 1 tablet (10 mg total) by mouth daily. 90 tablet 3  . aspirin EC 81 MG tablet Take 1 tablet  (81 mg total) by mouth daily. 90 tablet 3  . benztropine (COGENTIN) 0.5 MG tablet Take 1 tablet (0.5 mg total) by mouth daily. 90 tablet 0  . [START ON 09/30/2019] calcium carbonate (CALCIUM 600) 600 MG TABS tablet Take 1 tablet (600 mg total) by mouth 2 (two) times daily with a meal. 180 tablet 0  . carvedilol (COREG) 25 MG tablet TAKE 1 TABLET (25 MG TOTAL) BY MOUTH 2 (TWO) TIMES DAILY WITH A MEAL. 60 tablet 5  . chlorproMAZINE (THORAZINE) 50 MG tablet Take 1 tablet (50 mg total) by mouth at bedtime. 90 tablet 0  . [START ON 09/30/2019] Cholecalciferol (VITAMIN D3) 125 MCG (5000 UT) CAPS Take 1 capsule (5,000 Units total) by mouth daily with breakfast. Take along with calcium and magnesium. 90 capsule 0  . cilostazol (PLETAL) 50 MG tablet Take 1 tablet (50 mg total) by mouth 2 (two) times daily. 60 tablet 3  . clonazePAM (KLONOPIN) 0.5 MG tablet Take 1 tablet (0.5 mg total) by mouth 3 (three) times daily as needed for anxiety. 90 tablet 2  . cloNIDine (CATAPRES) 0.1 MG tablet Take 1  tablet (0.1 mg total) by mouth 2 (two) times daily. 60 tablet 5  . clopidogrel (PLAVIX) 75 MG tablet Take 75 mg by mouth daily.    . cyclobenzaprine (FEXMID) 7.5 MG tablet TAKE 1 TABLET BY MOUTH 3 TIMES A DAY AS NEEDED FOR MUSCLE SPASMS 90 tablet 5  . FLUoxetine HCl 60 MG TABS Take 60 mg by mouth daily. 90 tablet 0  . Insulin Glargine (LANTUS SOLOSTAR) 100 UNIT/ML Solostar Pen Inject 40 Units into the skin daily. (Patient taking differently: Inject 40 Units into the skin at bedtime. ) 5 pen PRN  . insulin lispro (HUMALOG KWIKPEN) 100 UNIT/ML KwikPen UAD: inj 5u Sq bid; 25u prn flow chart 15 mL 11  . Insulin Syringe-Needle U-100 29G X 1/2" 0.3 ML MISC 1 each by Does not apply route 2 (two) times daily. 30 each 0  . isosorbide mononitrate (IMDUR) 30 MG 24 hr tablet TAKE 1 TABLET (30 MG TOTAL) BY MOUTH DAILY. PLEASE KEEP APPOINTMENT FOR FURTHER REFILLS! THANKS :) (Patient taking differently: Take 30 mg by mouth daily. ) 90  tablet 3  . lamoTRIgine (LAMICTAL) 200 MG tablet Take 1 tablet (200 mg total) by mouth daily. 90 tablet 0  . losartan (COZAAR) 100 MG tablet Take 1 tablet (100 mg total) by mouth daily. 90 tablet 3  . magic mouthwash SOLN Take 15 mLs by mouth 3 (three) times daily as needed for mouth pain. 250 mL 1  . [START ON 09/30/2019] Magnesium 500 MG CAPS Take 1 capsule (500 mg total) by mouth 2 (two) times daily at 8 am and 10 pm. 180 capsule 0  . naloxone (NARCAN) nasal spray 4 mg/0.1 mL 4 mg (contents of 1 nasal spray) as a single dose in one nostril; may repeat every 2 to 3 minutes in alternating nostrils until medical assistance becomes available 1 kit 0  . nicotine (NICODERM CQ) 14 mg/24hr patch Place 1 patch (14 mg total) onto the skin daily. 28 patch 0  . nitroGLYCERIN (NITROSTAT) 0.4 MG SL tablet PLACE 1 TABLET (0.4 MG TOTAL) UNDER THE TONGUE EVERY 5 (FIVE) MINUTES AS NEEDED FOR CHEST PAIN. 25 tablet PRN  . ondansetron (ZOFRAN) 4 MG tablet Take 1 tablet (4 mg total) by mouth daily as needed. 20 tablet 5  . [START ON 09/30/2019] oxyCODONE (OXY IR/ROXICODONE) 5 MG immediate release tablet Take 1 tablet (5 mg total) by mouth 2 (two) times daily as needed for severe pain. Must last 30 days. 60 tablet 0  . [START ON 09/30/2019] pregabalin (LYRICA) 225 MG capsule Take 1 capsule (225 mg total) by mouth 2 (two) times daily. Take 1 capsule (150 mg total) 3 (three) times daily. 60 capsule 2  . rosuvastatin (CRESTOR) 10 MG tablet Take 1 tablet (10 mg total) by mouth daily. 90 tablet 3  . spironolactone (ALDACTONE) 25 MG tablet Take 1 tablet (25 mg total) by mouth daily. 90 tablet 3  . Cholecalciferol (VITAMIN D3) 1.25 MG (50000 UT) CAPS Take 1 capsule by mouth once a week.    Derrill Memo ON 10/30/2019] oxyCODONE (OXY IR/ROXICODONE) 5 MG immediate release tablet Take 1 tablet (5 mg total) by mouth 2 (two) times daily as needed for severe pain. Must last 30 days. (Patient not taking: Reported on 09/15/2019) 60 tablet 0  .  [START ON 11/29/2019] oxyCODONE (OXY IR/ROXICODONE) 5 MG immediate release tablet Take 1 tablet (5 mg total) by mouth 2 (two) times daily as needed for severe pain. Must last 30 days. (Patient not taking:  Reported on 09/15/2019) 60 tablet 0   No current facility-administered medications for this visit.     Allergies:   Chantix [varenicline tartrate]    Social History:  The patient  reports that she has been smoking cigarettes. She has a 27.00 pack-year smoking history. She has never used smokeless tobacco. She reports current alcohol use. She reports that she does not use drugs.   Family History:  The patient's family history includes Alcohol abuse in her father; Anxiety disorder in her sister; Diabetes in her father and mother; Drug abuse in her father; Heart disease in her father; Stroke in her sister. She was adopted.    ROS:  Please see the history of present illness.   Otherwise, review of systems are positive for none.   All other systems are reviewed and negative.    PHYSICAL EXAM: VS:  BP (!) 192/109 (BP Location: Left Arm, Patient Position: Sitting, Cuff Size: Normal)   Pulse (!) 111   Temp (!) 97.3 F (36.3 C)   Ht '5\' 7"'$  (1.702 m)   Wt 209 lb (94.8 kg)   BMI 32.73 kg/m  , BMI Body mass index is 32.73 kg/m. GEN: Well nourished, well developed, in no acute distress  HEENT: normal  Neck: no JVD, carotid bruits, or masses Cardiac: RRR; no  rubs, or gallops,no edema . There is 1/6 systolic flow murmur in the pulmonic area Respiratory:  clear to auscultation bilaterally, normal work of breathing GI: soft, nontender, nondistended, + BS MS: no deformity or atrophy  Skin: warm and dry, no rash Neuro:  Strength and sensation are intact Psych: euthymic mood, full affect   EKG:  EKG is  ordered today. EKG showed sinus tachycardia with possible left atrial enlargement and old septal infarct.   Recent Labs: 01/07/2019: Magnesium 2.0 09/07/2019: ALT 23; BUN 9; Creatinine, Ser 0.50;  Potassium 3.8; Sodium 137 09/15/2019: Hemoglobin 16.7; Platelets 453; TSH 1.693    Lipid Panel    Component Value Date/Time   CHOL 157 09/07/2019 0846   CHOL 121 08/21/2016 0817   TRIG 191.0 (H) 09/07/2019 0846   HDL 38.70 (L) 09/07/2019 0846   HDL 24 (L) 08/21/2016 0817   CHOLHDL 4 09/07/2019 0846   VLDL 38.2 09/07/2019 0846   LDLCALC 80 09/07/2019 0846   LDLCALC 64 08/21/2016 0817   LDLDIRECT 116.0 06/29/2019 1040      Wt Readings from Last 3 Encounters:  09/15/19 209 lb (94.8 kg)  09/15/19 206 lb 6.4 oz (93.6 kg)  09/07/19 210 lb 6.4 oz (95.4 kg)         ASSESSMENT AND PLAN:  1.  Peripheral arterial disease with left Calf claudication: This is due to diffuse disease in the SFA with occlusion distally.  In addition, she has aggressive disease below the knee.  This is very concerning.  Endovascular revascularization of the left SFA is possible but I do not think she will have good long-term patency with it.  Her best option for now is to continue with medical therapy and a walking program and allow collaterals to form.  If revascularization is needed in the future, her best option is femoral popliteal bypass.  Her symptoms are currently stable.   2. Coronary artery disease involving native coronary arteries with stable angina: Status post CABG in July of 2017.   Symptoms are controlled with long-acting nitroglycerin and a beta-blocker.  3. Carotid artery disease status post right carotid endarterectomy. Continue aggressive treatment of risk factors.  4. Essential hypertension:  Blood pressure continues to be significantly elevated in spite of multiple blood pressure medications.  Not really sure what else to add.  She will be a candidate for renal denervation if the therapy becomes approved.   5. Hyperlipidemia: Continue treatment with rosuvastatin and Praluent.  6. Tobacco use: She is determined to quit smoking on October 1.    Disposition:   FU with me in 4  months  Signed,  [name], MD  09/15/2019 3:53 PM    [place]

## 2019-09-15 NOTE — Progress Notes (Signed)
New patient visit for polycythemia.

## 2019-09-15 NOTE — Patient Instructions (Signed)
Medication Instructions:  Your physician recommends that you continue on your current medications as directed. Please refer to the Current Medication list given to you today.  Dr. Fletcher Anon would like you to stop Pletal (cilostazol) if your are currently taking it. Please double check your medication when you get home.  If you need a refill on your cardiac medications before your next appointment, please call your pharmacy.   Lab work: None ordered If you have labs (blood work) drawn today and your tests are completely normal, you will receive your results only by: Marland Kitchen MyChart Message (if you have MyChart) OR . A paper copy in the mail If you have any lab test that is abnormal or we need to change your treatment, we will call you to review the results.  Testing/Procedures: None ordered  Follow-Up: At [facility], you and your health needs are our priority.  As part of our continuing mission to provide you with exceptional heart care, we have created designated Provider Care Teams.  These Care Teams include your primary Cardiologist (physician) and Advanced Practice Providers (APPs -  Physician Assistants and Nurse Practitioners) who all work together to provide you with the care you need, when you need it. You will need a follow up appointment in 4 months.  Please call our office 2 months in advance to schedule this appointment.  You may see [name], MD or one of the following Advanced Practice Providers on your designated Care Team:   Murray Hodgkins, NP Christell Faith, PA-C . Marrianne Mood, PA-C  Any Other Special Instructions Will Be Listed Below (If Applicable). [name]

## 2019-09-16 DIAGNOSIS — D473 Essential (hemorrhagic) thrombocythemia: Secondary | ICD-10-CM | POA: Insufficient documentation

## 2019-09-16 DIAGNOSIS — D751 Secondary polycythemia: Secondary | ICD-10-CM | POA: Insufficient documentation

## 2019-09-16 DIAGNOSIS — D75838 Other thrombocytosis: Secondary | ICD-10-CM | POA: Insufficient documentation

## 2019-09-16 DIAGNOSIS — D75839 Thrombocytosis, unspecified: Secondary | ICD-10-CM | POA: Insufficient documentation

## 2019-09-16 LAB — HEPATITIS PANEL, ACUTE
HCV Ab: <0.1 s/co ratio (ref 0.0–0.9)
Hep A IgM: NEGATIVE
Hep B C IgM: NEGATIVE
Hepatitis B Surface Ag: NEGATIVE

## 2019-09-16 LAB — CARBON MONOXIDE, BLOOD (PERFORMED AT REF LAB): Carbon Monoxide, Blood: 9.3 % — ABNORMAL HIGH (ref 0.0–3.6)

## 2019-09-16 LAB — HIV ANTIBODY (ROUTINE TESTING W REFLEX): HIV Screen 4th Generation wRfx: NONREACTIVE

## 2019-09-16 NOTE — Progress Notes (Signed)
Hematology/Oncology Consult note [facility] Telephone:([phone] [phone]   Patient Care Team: Lucille Passy, MD as PCP - General Wellington Hampshire, MD as PCP - Cardiology (Cardiology)  REFERRING PROVIDER: Lucille Passy, MD  CHIEF COMPLAINTS/REASON FOR VISIT:  Evaluation of polycytosis  HISTORY OF PRESENTING ILLNESS:  Erika [name] is a 48 y.o. female who was seen in consultation at the request of Deborra Medina, Talia M, MD for evaluation of polycytosis/erythrocytosis Reviewed patient's recent lab work 08/13/2019 labs showed elevated hemoglobin at 16.9,  total white count 13.5, platelet counts 499. Chronic Onset, leukocytosis duration since at least 2010.  Intermittent erythrocytosis since at least 2015 intermittent thrombocytosis since at least 2014. She had another recent CBC on 09/07/2019 which showed slightly lower hemoglobin 15.  Patient reports that that is because she had menstrual.  During the time that she had blood work done. No aggravating or alleviating factors.   Associated signs or symptoms: Denies any unintentional weight loss or night sweating.  She feels very tired.  Context:  Smoking history: Current daily smoking.  27-pack-year smoking  History of blood clots: Patient had a history of CAD, status post bypass.  Carotid artery disease.  Diastolic heart failure Daytime somnolence: Some Family history of polycythemia: Reports that father has a history of " blood being sick" phlebotomy.   Review of Systems  Constitutional: Positive for fatigue. Negative for appetite change, chills and fever.  HENT:   Negative for hearing loss and voice change.   Eyes: Negative for eye problems.  Respiratory: Negative for chest tightness and cough.   Cardiovascular: Negative for chest pain.  Gastrointestinal: Negative for abdominal distention, abdominal pain and blood in stool.  Endocrine: Negative for hot flashes.  Genitourinary: Negative for  difficulty urinating and frequency.   Musculoskeletal: Negative for arthralgias.       Lower extremity cramps  Skin: Negative for itching and rash.  Neurological: Negative for extremity weakness.  Hematological: Negative for adenopathy.  Psychiatric/Behavioral: Negative for confusion.    MEDICAL HISTORY:  Past Medical History:  Diagnosis Date  . Arthralgia of temporomandibular joint   . CAD, multiple vessel    a. cath 06/29/16: ostLM 40%, ostLAD 40%, pLAD 95%, ost-pLCx 60%, pLCx 95%, mLCx 60%, mRCA 95%, D2 50%, LVSF nl;  b. 07/2016 CABG x 4 (LIMA->LAD, VG->Diag, VG->OM, VG->RCA); c. 08/2016 Cath: 3VD w/ 4/4 patent grafts. LAD distal to LIMA has diff dzs->Med rx.  . Carotid arterial disease ([place])    a. 07/2016 s/p R CEA.  . Clotting disorder ([place])   . Depression   . Diastolic dysfunction    a. echo 06/28/16: EF 50-55%, mild inf wall HK, GR1DD, mild MR, RV sys fxn nl, mildly dilated LA, PASP nl  . Fatty liver disease, nonalcoholic 3570  . HLD (hyperlipidemia)   . Labile hypertension    a. prior renal ngiogram negative for RAS in 03/2016; b. catecholamines and metanephrines normal, mildly elevated renin with normal aldosterone and normal ratio in 02/2016  . Obesity   . PTSD (post-traumatic stress disorder)   . Tobacco abuse    a. 2018 - cut back from 2 ppd to 0.5 ppd.  . Type 2 diabetes mellitus [facility]) January 2017    SURGICAL HISTORY: Past Surgical History:  Procedure Laterality Date  . ABDOMINAL AORTOGRAM W/LOWER EXTREMITY [name] 10/15/2018   Procedure: ABDOMINAL AORTOGRAM W/LOWER EXTREMITY;  Surgeon: Wellington Hampshire, MD;  Location: [place] [facility];  Service: Cardiovascular;  Laterality: [name];  .  ABDOMINAL AORTOGRAM W/LOWER EXTREMITY Bilateral 08/19/2019   Procedure: ABDOMINAL AORTOGRAM W/LOWER EXTREMITY;  Surgeon: Wellington Hampshire, MD;  Location: [place] [facility];  Service: Cardiovascular;  Laterality: Bilateral;  . CARDIAC CATHETERIZATION [name] 06/29/2016   Procedure: Left Heart Cath  and Coronary Angiography;  Surgeon: Minna Merritts, MD;  Location: [place] [facility];  Service: Cardiovascular;  Laterality: [name];  . CARDIAC CATHETERIZATION [name] 08/29/2016   Procedure: Left Heart Cath and Cors/Grafts Angiography;  Surgeon: Wellington Hampshire, MD;  Location: [place] [facility];  Service: Cardiovascular;  Laterality: [name];  . CESAREAN SECTION    . CHOLECYSTECTOMY    . CORONARY ARTERY BYPASS GRAFT [name] 07/06/2016   Procedure: CORONARY ARTERY BYPASS GRAFTING (CABG) x four, using left internal mammary artery and right leg greater saphenous vein harvested endoscopically;  Surgeon: Ivin Poot, MD;  Location: [place];  Service: Open Heart Surgery;  Laterality: [name];  . ENDARTERECTOMY Right 07/06/2016   Procedure: ENDARTERECTOMY CAROTID;  Surgeon: Rosetta Posner, MD;  Location: [place];  Service: Vascular;  Laterality: Right;  . PERIPHERAL VASCULAR CATHETERIZATION [name] 04/18/2016   Procedure: Renal Angiography;  Surgeon: Wellington Hampshire, MD;  Location: [place] [facility];  Service: Cardiovascular;  Laterality: [name];  . PERIPHERAL VASCULAR INTERVENTION Left 10/15/2018   Procedure: PERIPHERAL VASCULAR INTERVENTION;  Surgeon: Wellington Hampshire, MD;  Location: [place] [facility];  Service: Cardiovascular;  Laterality: Left;  Left superficial femoral  . TEE WITHOUT CARDIOVERSION [name] 07/06/2016   Procedure: TRANSESOPHAGEAL ECHOCARDIOGRAM (TEE);  Surgeon: Ivin Poot, MD;  Location: [place];  Service: Open Heart Surgery;  Laterality: [name];  . TONSILLECTOMY      SOCIAL HISTORY: Social History   Socioeconomic History  . Marital status: Married    Spouse name: Not on file  . Number of children: 1  . Years of education: 50  . Highest education level: Not on file  Occupational History  . Occupation: Disability  Social Needs  . Financial resource strain: Not on file  . Food insecurity    Worry: Not on file    Inability: Not on file  . Transportation needs    Medical: Not on file     Non-medical: Not on file  Tobacco Use  . Smoking status: Current Every Day Smoker    Packs/day: 1.00    Years: 27.00    Pack years: 27.00    Types: Cigarettes  . Smokeless tobacco: Never Used  . Tobacco comment: patient smokes when she drives.   Substance and Sexual Activity  . Alcohol use: Yes    Alcohol/week: 0.0 standard drinks    Comment: socially  . Drug use: No  . Sexual activity: Yes    Partners: Male    Birth control/protection: None  Lifestyle  . Physical activity    Days per week: Not on file    Minutes per session: Not on file  . Stress: Not on file  Relationships  . Social Herbalist on phone: Not on file    Gets together: Not on file    Attends religious service: Not on file    Active member of club or organization: Not on file    Attends meetings of clubs or organizations: Not on file    Relationship status: Not on file  . Intimate partner violence    Fear of current or ex partner: Not on file    Emotionally abused: Not on file    Physically abused: Not  on file    Forced sexual activity: Not on file  Other Topics Concern  . Not on file  Social History Narrative   Lives with husband   Caffeine use: Drinks 2 cups coffee per day   Right handed    FAMILY HISTORY: Family History  Adopted: Yes  Problem Relation Age of Onset  . Diabetes Mother   . Diabetes Father   . Alcohol abuse Father   . Heart disease Father   . Drug abuse Father   . Stroke Sister   . Anxiety disorder Sister     ALLERGIES:  is allergic to chantix [varenicline tartrate].  MEDICATIONS:  Current Outpatient Medications  Medication Sig Dispense Refill  . albuterol (PROVENTIL HFA;VENTOLIN HFA) 108 (90 Base) MCG/ACT inhaler Inhale 2 puffs into the lungs every 6 (six) hours as needed for wheezing. 1 Inhaler 0  . Alirocumab (PRALUENT) 150 MG/ML SOAJ Inject 150 mg into the skin every 14 (fourteen) days. (Patient taking differently: Inject 150 mg into the skin every 14  (fourteen) days. Last dose 3 week ago) 2 pen 11  . amLODipine (NORVASC) 10 MG tablet Take 1 tablet (10 mg total) by mouth daily. 90 tablet 3  . aspirin EC 81 MG tablet Take 1 tablet (81 mg total) by mouth daily. 90 tablet 3  . benztropine (COGENTIN) 0.5 MG tablet Take 1 tablet (0.5 mg total) by mouth daily. 90 tablet 0  . [START ON 09/30/2019] calcium carbonate (CALCIUM 600) 600 MG TABS tablet Take 1 tablet (600 mg total) by mouth 2 (two) times daily with a meal. 180 tablet 0  . carvedilol (COREG) 25 MG tablet TAKE 1 TABLET (25 MG TOTAL) BY MOUTH 2 (TWO) TIMES DAILY WITH A MEAL. 60 tablet 5  . chlorproMAZINE (THORAZINE) 50 MG tablet Take 1 tablet (50 mg total) by mouth at bedtime. 90 tablet 0  . [START ON 09/30/2019] Cholecalciferol (VITAMIN D3) 125 MCG (5000 UT) CAPS Take 1 capsule (5,000 Units total) by mouth daily with breakfast. Take along with calcium and magnesium. 90 capsule 0  . clonazePAM (KLONOPIN) 0.5 MG tablet Take 1 tablet (0.5 mg total) by mouth 3 (three) times daily as needed for anxiety. 90 tablet 2  . cloNIDine (CATAPRES) 0.1 MG tablet Take 1 tablet (0.1 mg total) by mouth 2 (two) times daily. 60 tablet 5  . clopidogrel (PLAVIX) 75 MG tablet Take 75 mg by mouth daily.    . cyclobenzaprine (FEXMID) 7.5 MG tablet TAKE 1 TABLET BY MOUTH 3 TIMES A DAY AS NEEDED FOR MUSCLE SPASMS 90 tablet 5  . FLUoxetine HCl 60 MG TABS Take 60 mg by mouth daily. 90 tablet 0  . Insulin Glargine (LANTUS SOLOSTAR) 100 UNIT/ML Solostar Pen Inject 40 Units into the skin daily. (Patient taking differently: Inject 40 Units into the skin at bedtime. ) 5 pen PRN  . insulin lispro (HUMALOG KWIKPEN) 100 UNIT/ML KwikPen UAD: inj 5u Sq bid; 25u prn flow chart 15 mL 11  . Insulin Syringe-Needle U-100 29G X 1/2" 0.3 ML MISC 1 each by Does not apply route 2 (two) times daily. 30 each 0  . isosorbide mononitrate (IMDUR) 30 MG 24 hr tablet TAKE 1 TABLET (30 MG TOTAL) BY MOUTH DAILY. PLEASE KEEP APPOINTMENT FOR FURTHER  REFILLS! THANKS :) (Patient taking differently: Take 30 mg by mouth daily. ) 90 tablet 3  . lamoTRIgine (LAMICTAL) 200 MG tablet Take 1 tablet (200 mg total) by mouth daily. 90 tablet 0  . losartan (COZAAR) 100 MG  tablet Take 1 tablet (100 mg total) by mouth daily. 90 tablet 3  . magic mouthwash SOLN Take 15 mLs by mouth 3 (three) times daily as needed for mouth pain. 250 mL 1  . [START ON 09/30/2019] Magnesium 500 MG CAPS Take 1 capsule (500 mg total) by mouth 2 (two) times daily at 8 am and 10 pm. 180 capsule 0  . naloxone (NARCAN) nasal spray 4 mg/0.1 mL 4 mg (contents of 1 nasal spray) as a single dose in one nostril; may repeat every 2 to 3 minutes in alternating nostrils until medical assistance becomes available 1 kit 0  . nitroGLYCERIN (NITROSTAT) 0.4 MG SL tablet PLACE 1 TABLET (0.4 MG TOTAL) UNDER THE TONGUE EVERY 5 (FIVE) MINUTES AS NEEDED FOR CHEST PAIN. 25 tablet PRN  . ondansetron (ZOFRAN) 4 MG tablet Take 1 tablet (4 mg total) by mouth daily as needed. 20 tablet 5  . [START ON 09/30/2019] pregabalin (LYRICA) 225 MG capsule Take 1 capsule (225 mg total) by mouth 2 (two) times daily. Take 1 capsule (150 mg total) 3 (three) times daily. 60 capsule 2  . rosuvastatin (CRESTOR) 10 MG tablet Take 1 tablet (10 mg total) by mouth daily. 90 tablet 3  . spironolactone (ALDACTONE) 25 MG tablet Take 1 tablet (25 mg total) by mouth daily. 90 tablet 3  . Cholecalciferol (VITAMIN D3) 1.25 MG (50000 UT) CAPS Take 1 capsule by mouth once a week.    . nicotine (NICODERM CQ) 14 mg/24hr patch Place 1 patch (14 mg total) onto the skin daily. 28 patch 0  . [START ON 09/30/2019] oxyCODONE (OXY IR/ROXICODONE) 5 MG immediate release tablet Take 1 tablet (5 mg total) by mouth 2 (two) times daily as needed for severe pain. Must last 30 days. 60 tablet 0  . [START ON 10/30/2019] oxyCODONE (OXY IR/ROXICODONE) 5 MG immediate release tablet Take 1 tablet (5 mg total) by mouth 2 (two) times daily as needed for severe pain.  Must last 30 days. (Patient not taking: Reported on 09/15/2019) 60 tablet 0  . [START ON 11/29/2019] oxyCODONE (OXY IR/ROXICODONE) 5 MG immediate release tablet Take 1 tablet (5 mg total) by mouth 2 (two) times daily as needed for severe pain. Must last 30 days. (Patient not taking: Reported on 09/15/2019) 60 tablet 0   No current facility-administered medications for this visit.      PHYSICAL EXAMINATION: ECOG PERFORMANCE STATUS: 1 - Symptomatic but completely ambulatory Vitals:   09/15/19 1020  BP: (!) 179/113  Resp: 16  Temp: 97.8 F (36.6 C)  SpO2: 97%   Filed Weights   09/15/19 1020  Weight: 206 lb 6.4 oz (93.6 kg)    Physical Exam Constitutional:      General: She is not in acute distress.    Appearance: She is obese.  HENT:     Head: Normocephalic and atraumatic.  Eyes:     General: No scleral icterus.    Pupils: Pupils are equal, round, and reactive to light.  Neck:     Musculoskeletal: Normal range of motion and neck supple.  Cardiovascular:     Rate and Rhythm: Normal rate and regular rhythm.     Heart sounds: Normal heart sounds.  Pulmonary:     Effort: Pulmonary effort is normal. No respiratory distress.     Breath sounds: No wheezing.  Abdominal:     General: Bowel sounds are normal. There is no distension.     Palpations: Abdomen is soft. There is no mass.  Tenderness: There is no abdominal tenderness.  Musculoskeletal: Normal range of motion.        General: No deformity.  Skin:    General: Skin is warm and dry.     Findings: No erythema or rash.  Neurological:     Mental Status: She is alert and oriented to person, place, and time.     Cranial Nerves: No cranial nerve deficit.     Coordination: Coordination normal.  Psychiatric:        Behavior: Behavior normal.        Thought Content: Thought content normal.     RADIOGRAPHIC STUDIES: I have personally reviewed the radiological images as listed and agreed with the findings in the report. Mr  Hip Left Wo Contrast  Result Date: 08/13/2019 CLINICAL DATA:  Left hip severe pain with walking and sitting EXAM: MR OF THE LEFT HIP WITHOUT CONTRAST TECHNIQUE: Multiplanar, multisequence MR imaging was performed. No intravenous contrast was administered. COMPARISON:  05/28/2019 FINDINGS: Bones: There is no evidence of acute fracture, dislocation or avascular necrosis. The femoral head is normal in shape, configuration and sphericity. No osseous bump at the femoral head neck junction. The visualized bony pelvis appears normal. The visualized sacroiliac joints and symphysis pubis appear normal. Articular cartilage and labrum Articular cartilage: There is mild superior chondral thinning with a small marginal osteophyte seen at the superior femoral head. Labrum: There is no gross labral tear or paralabral abnormality. Joint or bursal effusion Joint effusion: No significant hip joint effusion. Bursae: No focal periarticular fluid collection. Muscles and tendons Muscles and tendons: Increased signal seen at the insertion site of the gluteal tendons. The hamstrings and iliopsoas tendons are intact. Other findings Miscellaneous: The visualized internal pelvic contents appear unremarkable. IMPRESSION: Mild right hip osteoarthritis. Insertional gluteal tendinosis. Electronically Signed   By: Prudencio Pair M.D.   On: 08/13/2019 13:43   [facility] C-arm 1-60 Min No Report  Result Date: 08/27/2019 Fluoro was used, but no Radiologist interpretation will be provided. Please refer to "NOTES" tab for provider progress note.    LABORATORY DATA:  I have reviewed the data as listed Lab Results  Component Value Date   WBC 15.4 (H) 09/15/2019   HGB 16.7 (H) 09/15/2019   HCT 48.6 (H) 09/15/2019   MCV 87.9 09/15/2019   PLT 453 (H) 09/15/2019   Recent Labs    10/10/18 0926 01/07/19 0924  01/07/19 1148 02/25/19 1056 03/17/19 0937 06/29/19 1040 08/13/19 1427 08/19/19 1145 09/07/19 0846  NA 133*  --    < > 137  129*  --  131* 133* 135 137  K 4.0  --   --  4.8 4.9  --  4.5 4.6 4.0 3.8  CL 96*  --   --  96 94*  --  95* 91* 96* 103  CO2 27  --   --   --  23  --  26 21  --  26  GLUCOSE 266*  --    < > 209* 599*  --  438* 319* 147* 87  BUN 9  --    < > 13 10  --  '11 8 12 9  '$ CREATININE 0.74  --   --  0.71 0.62  --  0.66 0.68 0.50 0.50  CALCIUM 9.6  --   --  10.7* 9.9  --  10.1 11.2*  --  10.0  GFRNONAA >60  --   --  103 >60  --   --  106  --   --  GFRAA >60  --   --  119 >60  --   --  122  --   --   PROT 7.2 7.8  --  7.8  --  7.3 6.7  --   --  7.0  ALBUMIN 3.7 4.2  --  4.6  --   --  4.0  --   --  4.1  AST 35 20  --  17  --   --  12  --   --  16  ALT 67* 46*  --   --   --   --  31  --   --  23  ALKPHOS 90 88  --  99  --   --  109  --   --  73  BILITOT 0.3 0.4  --  0.3  --   --  0.2  --   --  0.4  BILIDIR <0.1 <0.1  --   --   --   --   --   --   --   --   IBILI NOT CALCULATED NOT CALCULATED  --   --   --   --   --   --   --   --    < > = values in this interval not displayed.   Iron/TIBC/Ferritin/ %Sat    Component Value Date/Time   IRON 113 09/15/2019 1101   IRON 104 04/20/2013 1515   TIBC 416 09/15/2019 1101   TIBC 400 04/20/2013 1515   FERRITIN 54 09/15/2019 1101   FERRITIN 59 04/20/2013 1515   IRONPCTSAT 27 09/15/2019 1101   IRONPCTSAT 30 06/29/2019 1040        ASSESSMENT & PLAN:  1. Erythrocytosis   2. Leukocytosis, unspecified type   3. Thrombocytosis ([place])   4. Tobacco abuse     Labs are reviewed and discussed with patient. Polycythemia (erythrocytosis) is an abnormal elevation of hemoglobin (Hgb) and/or hematocrit (Hct) in peripheral blood, and this can be caused by primary etiology, ie bone marrow mutation, or secondary etiology, ie hypoxia, smoking, androgen supplements, etc.  I will obtain erythropoietin, carbo monoxide level, rule out primary etiology, JAK2 with reflex to other mutations,  Tobacco abuse, smoking cessation was discussed with patient in details  She plans to  quit smoking this October. Discussed with patient that tobacco abuse can cause reactive leukocytosis and erythrocytosis as well.  #Thrombocytosis, reactive versus secondary to underlying bone marrow process.  Check HIV, hepatitis panel.  Iron panel. Patient can follow-up in the clinic in 3 weeks to discuss blood work.  Orders Placed This Encounter  Procedures  . CBC with Differential/Platelet    Standing Status:   Future    Number of Occurrences:   1    Standing Expiration Date:   09/14/2020  . Iron and TIBC    Standing Status:   Future    Number of Occurrences:   1    Standing Expiration Date:   09/14/2020  . Ferritin    Standing Status:   Future    Number of Occurrences:   1    Standing Expiration Date:   09/14/2020  . JAK2 V617F, w Reflex to CALR/E12/MPL    Standing Status:   Future    Number of Occurrences:   1    Standing Expiration Date:   09/14/2020  . Carbon monoxide, blood (performed at ref lab)    Standing Status:   Future    Number of Occurrences:   1  Standing Expiration Date:   09/14/2020  . TSH    Standing Status:   Future    Number of Occurrences:   1    Standing Expiration Date:   09/14/2020  . Retic Panel    Standing Status:   Future    Number of Occurrences:   1    Standing Expiration Date:   09/14/2020  . Hepatitis panel, acute    Standing Status:   Future    Number of Occurrences:   1    Standing Expiration Date:   09/14/2020  . HIV Antibody (routine testing w rflx)    Standing Status:   Future    Number of Occurrences:   1    Standing Expiration Date:   09/14/2020    We spent sufficient time to discuss many aspect of care, questions were answered to patient's satisfaction. The patient knows to call the clinic with any problems questions or concerns.  Cc Lucille Passy, MD  Return of visit: 2-3 weeks.  Thank you for this kind referral and the opportunity to participate in the care of this patient. A copy of today's note is routed to referring provider     Earlie Server, MD, PhD 09/16/2019

## 2019-09-17 ENCOUNTER — Other Ambulatory Visit (HOSPITAL_COMMUNITY): Payer: Self-pay

## 2019-09-17 DIAGNOSIS — F4310 Post-traumatic stress disorder, unspecified: Secondary | ICD-10-CM

## 2019-09-17 MED ORDER — CHLORPROMAZINE HCL 50 MG PO TABS: 50.0000 mg | ORAL_TABLET | Freq: Every day | ORAL | 0 refills | Status: DC

## 2019-09-17 NOTE — Addendum Note (Signed)
Addended by: Anselm Pancoast on: 09/17/2019 11:02 AM   Modules accepted: Orders

## 2019-09-22 ENCOUNTER — Other Ambulatory Visit: Payer: Self-pay | Admitting: *Deleted

## 2019-09-22 NOTE — Telephone Encounter (Signed)
Please advise if ok to refill Historical provider.

## 2019-09-23 MED ORDER — CLOPIDOGREL BISULFATE 75 MG PO TABS: 75.0000 mg | ORAL_TABLET | Freq: Every day | ORAL | 3 refills | Status: DC

## 2019-09-23 MED ORDER — ISOSORBIDE MONONITRATE ER 30 MG PO TB24: 30.0000 mg | ORAL_TABLET | Freq: Every day | ORAL | 1 refills | Status: DC

## 2019-09-24 LAB — CALR + JAK2 E12-15 + MPL (REFLEXED)

## 2019-09-24 LAB — JAK2 V617F, W REFLEX TO CALR/E12/MPL

## 2019-09-28 ENCOUNTER — Ambulatory Visit: Payer: BC Managed Care – PPO | Attending: Pain Medicine

## 2019-09-28 ENCOUNTER — Other Ambulatory Visit: Payer: Self-pay

## 2019-09-28 DIAGNOSIS — M6281 Muscle weakness (generalized): Secondary | ICD-10-CM | POA: Insufficient documentation

## 2019-09-28 DIAGNOSIS — M25552 Pain in left hip: Secondary | ICD-10-CM | POA: Diagnosis not present

## 2019-09-28 DIAGNOSIS — R262 Difficulty in walking, not elsewhere classified: Secondary | ICD-10-CM | POA: Insufficient documentation

## 2019-09-28 NOTE — Patient Instructions (Signed)
Reviewed 50% weight bearing each LE evenly, sleep on R side with pillow between knees, use of eggshell crate style mattress topper, as well femoral control with sit <> stands and stair negotiation, use of SPC on R side to decrease pelvic drop, and applying proper stress to glute med tendon to help decrease irritation, and promote proper healing.  Pt verbalized understanding.

## 2019-09-28 NOTE — Therapy (Signed)
[facility] [address], [place], [address] Phone: [phone]   Fax:  [phone]  Physical Therapy Evaluation  Patient Details  Name: Erika [name] MRN: [id number] Date of Birth: 10/15/1971 Referring Provider (PT): Gwenlyn Perking, MD   Encounter Date: 09/28/2019  PT End of Session - 09/28/19 0851    Visit Number  1    Number of Visits  13    Date for PT Re-Evaluation  11/12/19    PT Start Time  0851    PT Stop Time  0955    PT Time Calculation (min)  64 min    Activity Tolerance  Patient tolerated treatment well    Behavior During Therapy  [facility] for tasks assessed/performed       Past Medical History:  Diagnosis Date  . Arthralgia of temporomandibular joint   . CAD, multiple vessel    a. cath 06/29/16: ostLM 40%, ostLAD 40%, pLAD 95%, ost-pLCx 60%, pLCx 95%, mLCx 60%, mRCA 95%, D2 50%, LVSF nl;  b. 07/2016 CABG x 4 (LIMA->LAD, VG->Diag, VG->OM, VG->RCA); c. 08/2016 Cath: 3VD w/ 4/4 patent grafts. LAD distal to LIMA has diff dzs->Med rx.  . Carotid arterial disease ([place])    a. 07/2016 s/p R CEA.  . Clotting disorder ([place])   . Depression   . Diastolic dysfunction    a. echo 06/28/16: EF 50-55%, mild inf wall HK, GR1DD, mild MR, RV sys fxn nl, mildly dilated LA, PASP nl  . Fatty liver disease, nonalcoholic [id number]  . HLD (hyperlipidemia)   . Labile hypertension    a. prior renal ngiogram negative for RAS in 03/2016; b. catecholamines and metanephrines normal, mildly elevated renin with normal aldosterone and normal ratio in 02/2016  . Obesity   . PTSD (post-traumatic stress disorder)   . Tobacco abuse    a. 2018 - cut back from 2 ppd to 0.5 ppd.  . Type 2 diabetes mellitus [facility]) January 2017    Past Surgical History:  Procedure Laterality Date  . ABDOMINAL AORTOGRAM W/LOWER EXTREMITY [name] 10/15/2018   Procedure: ABDOMINAL AORTOGRAM W/LOWER EXTREMITY;  Surgeon: Wellington Hampshire, MD;  Location: [place] [facility];  Service: Cardiovascular;  Laterality: [name];  . ABDOMINAL AORTOGRAM W/LOWER EXTREMITY Bilateral 08/19/2019   Procedure: ABDOMINAL AORTOGRAM W/LOWER EXTREMITY;  Surgeon: Wellington Hampshire, MD;  Location: [place] [facility];  Service: Cardiovascular;  Laterality: Bilateral;  . CARDIAC CATHETERIZATION [name] 06/29/2016   Procedure: Left Heart Cath and Coronary Angiography;  Surgeon: Minna Merritts, MD;  Location: [place] [facility];  Service: Cardiovascular;  Laterality: [name];  . CARDIAC CATHETERIZATION [name] 08/29/2016   Procedure: Left Heart Cath and Cors/Grafts Angiography;  Surgeon: Wellington Hampshire, MD;  Location: [place] [facility];  Service: Cardiovascular;  Laterality: [name];  . CESAREAN SECTION    . CHOLECYSTECTOMY    . CORONARY ARTERY BYPASS GRAFT [name] 07/06/2016   Procedure: CORONARY ARTERY BYPASS GRAFTING (CABG) x four, using left internal mammary artery and right leg greater saphenous vein harvested endoscopically;  Surgeon: Ivin Poot, MD;  Location: [place];  Service: Open Heart Surgery;  Laterality: [name];  . ENDARTERECTOMY Right 07/06/2016   Procedure: ENDARTERECTOMY CAROTID;  Surgeon: Rosetta Posner, MD;  Location: [place];  Service: Vascular;  Laterality: Right;  . PERIPHERAL VASCULAR CATHETERIZATION [name] 04/18/2016   Procedure: Renal Angiography;  Surgeon: Wellington Hampshire, MD;  Location: [place] [facility];  Service: Cardiovascular;  Laterality: [name];  .  PERIPHERAL VASCULAR INTERVENTION Left 10/15/2018   Procedure: PERIPHERAL VASCULAR INTERVENTION;  Surgeon: Wellington Hampshire, MD;  Location: [place] [facility];  Service: Cardiovascular;  Laterality: Left;  Left superficial femoral  . TEE WITHOUT CARDIOVERSION [name] 07/06/2016   Procedure: TRANSESOPHAGEAL ECHOCARDIOGRAM (TEE);  Surgeon: Ivin Poot, MD;  Location: [place];  Service: Open Heart Surgery;  Laterality: [name];  . TONSILLECTOMY      There were no vitals filed for this visit.   Subjective Assessment - 09/28/19 0852     Subjective  L hip pain: L low back to L lateral hip.  7/10 at most for the past 3 months. 0/10 L hip pain currently (pt sitting on a chair and shifting on R side)    Pertinent History  Pt states that her L hip pain is osteoarthritis, not a torn tendon. Her diagnosis changed after her MRI. Has had L hip pain for years, gradual onset. Has not had PT yet for her hip. Had chiropractic treatment which helped a little bit. Last time she had chirpractic treatment was last week. Only goes there if she needs to go. No specific schedule. No back or hip surgeries.  Also has PAD L LE, had 2 stents but still completely blocked. The stents are in her L medial distal thigh.  States having L low back pain to L lateral hip. Started using B axillary crutches last week for long distances.    Patient Stated Goals  Walk greater than 200 ft. Be more active to be able to go to the [place] and walk    Currently in Pain?  No/denies    Pain Score  0-No pain    Pain Location  Hip    Pain Orientation  Left    Pain Descriptors / Indicators  Sore;Tingling;Aching;Tender    Pain Type  Chronic pain    Pain Onset  More than a month ago    Pain Frequency  Occasional    Aggravating Factors   sleeping on L side, walking about 160 ft, going up stairs > going down stairs, picking up something from the floor, driving at times, standing up after sitting for a while (about 10 minutes)    Pain Relieving Factors  taking pressure off L hip, pain medicine does not make it feel better, readjusting.         [facility] PT Assessment - 09/28/19 0907      Assessment   Medical Diagnosis  Chronic L hip pain, gluteal tendonitis of L buttock.     Referring Provider (PT)  Gwenlyn Perking, MD    Onset Date/Surgical Date  09/14/19   Date PT referral signed. Chronic condition   Prior Therapy  Chiropractic care which helps temporarily. Has not yet had PT.       Precautions   Precaution Comments  PAD, CAD, clotting disorder, HTN      Restrictions    Other Position/Activity Restrictions  No known weight bearing restrictions.       Balance Screen   Has the patient fallen in the past 6 months  No    Has the patient had a decrease in activity level because of a fear of falling?   No    Is the patient reluctant to leave their home because of a fear of falling?   No      Home Environment   Additional Comments  Lives in a 1 story home. 5 steps front porch, B rails; 2 steps back of house no  rails, 2 steps at garage, no rails. Lives with husband and daughter.       Prior Function   Vocation Requirements  PLOF: able to ambulate longer distances with less hip pain      Observation/Other Assessments   Observations  increased L hip pain with L lateral shift and R lumbar convexity correction in standing.       Posture/Postural Control   Posture Comments  protracted neck, B scapular protraction, R shoulder slightly lower, "hanging on Y" hip posture, slight L lateral shift around L3/4 area, R lumbar convexity around L3/L4 area, L greater trochanter slightly higher.       AROM   Lumbar Flexion  limited with reproduction of symptoms. R lateral lean into flexion    Lumbar Extension  Limited with low back and L posterior hip discomfort.  L hip discomfort not as bad as flexion    Lumbar - Right Side Bend  limited with L hip stretch    Lumbar - Left Side Bend  limited with greatest L hip pain    Lumbar - Right Rotation  WFL with L hip pain    Lumbar - Left Rotation  [facility]      Strength   Right Hip Flexion  4/5    Left Hip Flexion  4-/5    Right Knee Flexion  4/5    Right Knee Extension  4/5    Left Knee Flexion  4/5    Left Knee Extension  4/5      Palpation   Palpation comment  TTP L glute med, L greater trochanter, L piriformis muscle area. TTP R greater trochanter but not as tender as L side.       Special Tests   Other special tests  (+) step down test B LE with decreased femoral control L > R with reproduction of symptoms; 6 minute walk  test: only able to ambulate 3 minutes for 392 ft, no AD      Ambulation/Gait   Gait Comments  L pelvic drop and R trunk lateral lean during R LE stance phase, decreased trunk rotation, increased lumbar extension                 Objective measurements completed on examination: See above findings.    Pt gives consent to perform CPR if needed. Pt states she lives at stroke level blood pressure.     No latex band allergies   Pt states her blood pressure is not controlled. Some days her blood pressure is perfect some days it is not.   6 minute walk rest after 1 minute and 2 more rest breaks performed in standing able to only perform for 3 minutes   392 ft  Blood pressure L arm sitting, mechanically take, normal cuff: 179/108, HR 96 Pt drank 4 cups of coffee.  Pt states having the same headache for the last 6 years every day. No dizziness, no blurred vision, or slurred speech.   200/108 BP manually taken.    Pt was recommended to talk to her PCP about her blood pressure. Pt states that her PCP already knows about it. She will let her know.   Pt also states that she has fatty liver disease inherited for from her father.      Monitor blood pressure   Therapeutic activities  Reviewed 50% weight bearing each LE evenly, sleep on R side with pillow between knees, use of eggshell crate style mattress topper, as well femoral control with  sit <> stands and stair negotiation, use of SPC on R side to decrease pelvic drop, and applying proper stress to glute med tendon to help decrease irritation, and promote proper healing.  Pt verbalized understanding.   Try standing L lateral shift correction at wall next visit if appropriate    Patient is a 48 year old female who came to physical therapy secondary to chronic L hip pain. She also presents with TTP to L glute med muscle and tendon, greater trochanter, and L piriformis muscle, altered gait pattern and posture, decreased femoral  control, decreased LE strength, and difficulty performing functional tasks such as walking longer distances. Pt will benefit from skilled physical therapy services to address the aforementioned deficits. Treatment dependent on blood pressure levels.     PT Education - 09/28/19 1040    Education Details  plan of care, decreasing stress to L hip    Person(s) Educated  Patient    Methods  Explanation;Demonstration    Comprehension  Verbalized understanding       PT Short Term Goals - 09/28/19 1020      PT SHORT TERM GOAL #1   Title  Patient will be independent with her HEP to decrease hip pain, improve strength, function, and ability to ambulate longer distances more comfortably.    Time  3    Period  Weeks    Status  New    Target Date  10/22/19        PT Long Term Goals - 09/28/19 1021      PT LONG TERM GOAL #1   Title  Patient will have a decrease in L hip pain to 3/10 or less at worst to promote ability to ambulate longer distances as well as negotiate stairs more comfortably.    Baseline  7/10 L hip pain at most for the past 3 months (09/28/2019)    Time  6    Period  Weeks    Status  New    Target Date  11/12/19      PT LONG TERM GOAL #2   Title  Patient will be able to ambulate for at least 6 minutes for at least 700 ft prior to needing to sit down due to pain as a demonstration of improved ability to ambulate longer distances.    Baseline  Pt only able to ambulate 392 ft for 3 minutes prior to sitting down due to L hip pain (09/28/2019)    Time  6    Period  Weeks    Status  New    Target Date  11/12/19      PT LONG TERM GOAL #3   Title  Pt will improve B LE strength by at least 1/2 MMT grade to promote ability to perform functional tasks.    Time  6    Period  Weeks    Status  New    Target Date  11/12/19             Plan - 09/28/19 1014    Clinical Impression Statement  Patient is a 48 year old female who came to physical therapy secondary to chronic L  hip pain. She also presents with TTP to L glute med muscle and tendon, greater trochanter, and L piriformis muscle, altered gait pattern and posture, decreased femoral control, decreased LE strength, and difficulty performing functional tasks such as walking longer distances. Pt will benefit from skilled physical therapy services to address the aforementioned deficits. Treatment dependent on  blood pressure levels.    Personal Factors and Comorbidities  Age;Comorbidity 3+;Fitness;Past/Current Experience;Time since onset of injury/illness/exacerbation    Comorbidities  DM, HTN, PVD, clotting disorder, CAD, current smoker    Examination-Activity Limitations  Bend;Carry;Lift;Locomotion Level;Sleep;Squat;Stairs    Stability/Clinical Decision Making  Evolving/Moderate complexity    Clinical Decision Making  Moderate   Pain overall worsening secondary to needing PT in addition to chiropractic care   Rehab Potential  Fair    PT Frequency  2x / week    PT Duration  6 weeks    PT Treatment/Interventions  Electrical Stimulation;Iontophoresis 4mg /ml Dexamethasone;Traction;Ultrasound;Gait training;Stair training;Functional mobility training;Therapeutic activities;Therapeutic exercise;Balance training;Neuromuscular re-education;Patient/family education;Manual techniques;Dry needling;Spinal Manipulations;Joint Manipulations   manipulations if appropriate   PT Next Visit Plan  hip strengthening, femoral control, trunk strengthening, manual techniques, modalities PRN    Consulted and Agree with Plan of Care  Patient       Patient will benefit from skilled therapeutic intervention in order to improve the following deficits and impairments:  Pain, Postural dysfunction, Improper body mechanics, Difficulty walking, Decreased strength  Visit Diagnosis: Pain in left hip - Plan: PT plan of care cert/re-cert  Muscle weakness (generalized) - Plan: PT plan of care cert/re-cert  Difficulty in walking, not elsewhere  classified - Plan: PT plan of care cert/re-cert     Problem List Patient Active Problem List   Diagnosis Date Noted  . Thrombocytosis ([place]) 09/16/2019  . Erythrocytosis 09/16/2019  . Well woman exam without gynecological exam 09/06/2019  . Hypercalcemia 09/06/2019  . Leukocytosis 09/06/2019  . Polycythemia 09/06/2019  . Chronic hip pain (Left) 08/27/2019  . Osteoarthritis of hip (Left) 08/27/2019  . Gluteal tendonitis of buttock (Left) 08/27/2019  . Radial nerve palsy (Right) 07/15/2019  . Neuropathy of right radial nerve 06/30/2019  . Abnormal bruising 06/25/2019  . Right hip pain 05/28/2019  . History of carotid endarterectomy (Right) 03/04/2019  . Pain medication agreement signed 03/04/2019  . Atypical facial pain (Right) 01/27/2019  . Chronic ear pain (Right) 01/27/2019  . Chronic jaw pain (Right) 01/27/2019  . Geniculate Neuralgia (Right) 01/27/2019  . Vitamin D deficiency 01/19/2019  . Neurogenic pain 01/19/2019  . Chronic anticoagulation (PLAVIX) 01/19/2019  . Chronic pain syndrome 01/07/2019  . Long term current use of opiate analgesic 01/07/2019  . Long term prescription benzodiazepine use 01/07/2019  . Pharmacologic therapy 01/07/2019  . Disorder of skeletal system 01/07/2019  . Problems influencing health status 01/07/2019  . Chronic headaches (Primary Area of Pain) (Right) 01/07/2019  . Opiate use 09/24/2018  . Myalgia 06/24/2018  . Chronic ankle pain (Bilateral) 12/27/2017  . Tendinopathy of right gluteus medius 12/27/2017  . Tendinopathy of gluteus medius (Left) 12/27/2017  . Bilateral hip pain 12/26/2017  . Other chronic pain 10/17/2017  . Elevated troponin I level 05/21/2017  . Major depressive disorder, recurrent episode, moderate ([place]) 11/19/2016  . Insomnia 10/30/2016  . Constipation 07/25/2016  . S/P CABG x 4 07/06/2016  . Bradycardia   . CAD (coronary artery disease)   . Carotid stenosis   . CAD in native artery 06/29/2016  . Elevated troponin  06/28/2016  . Essential hypertension, malignant 06/28/2016  . Tobacco abuse 06/28/2016  . Essential hypertension   . Malignant hypertension   . Diabetes mellitus due to underlying condition, uncontrolled, with circulatory complication, with long-term current use of insulin ([place])   . Chest pain with high risk for cardiac etiology 06/27/2016  . NSTEMI (non-ST elevated myocardial infarction) ([place]) 06/27/2016  . Proteinuria 03/15/2016  .  Renal artery stenosis ([place]) 03/15/2016  . MDD (major depressive disorder) 10/17/2015  . Agoraphobia with panic attacks 04/25/2015  . HTN (hypertension), malignant 10/20/2013  . Cluster headache 03/20/2012  . HLD (hyperlipidemia) 07/26/2010  . ADJUSTMENT DISORDER WITH MIXED FEATURES 07/26/2010   Joneen Boers PT, DPT   09/28/2019, 10:47 AM  [place] [name] [place], [address] Phone: [phone]   Fax:  [phone]  Name: Erika [name] MRN: [id number] Date of Birth: [birth year]

## 2019-09-28 NOTE — Progress Notes (Signed)
Patient is coming in for follow up, she is doing well no major complaints.  

## 2019-09-29 ENCOUNTER — Other Ambulatory Visit: Payer: Self-pay

## 2019-09-29 ENCOUNTER — Inpatient Hospital Stay: Payer: BC Managed Care – PPO | Attending: Oncology | Admitting: Oncology

## 2019-09-29 ENCOUNTER — Encounter: Payer: Self-pay | Admitting: Oncology

## 2019-09-29 VITALS — BP 123/80 | HR 90 | Temp 98.4°F | Resp 18 | Wt 209.9 lb

## 2019-09-29 DIAGNOSIS — Z79899 Other long term (current) drug therapy: Secondary | ICD-10-CM | POA: Diagnosis not present

## 2019-09-29 DIAGNOSIS — E119 Type 2 diabetes mellitus without complications: Secondary | ICD-10-CM | POA: Insufficient documentation

## 2019-09-29 DIAGNOSIS — D473 Essential (hemorrhagic) thrombocythemia: Secondary | ICD-10-CM | POA: Diagnosis not present

## 2019-09-29 DIAGNOSIS — D72829 Elevated white blood cell count, unspecified: Secondary | ICD-10-CM | POA: Diagnosis not present

## 2019-09-29 DIAGNOSIS — Z720 Tobacco use: Secondary | ICD-10-CM | POA: Diagnosis not present

## 2019-09-29 DIAGNOSIS — D75839 Thrombocytosis, unspecified: Secondary | ICD-10-CM

## 2019-09-29 DIAGNOSIS — D751 Secondary polycythemia: Secondary | ICD-10-CM | POA: Diagnosis not present

## 2019-09-29 DIAGNOSIS — F329 Major depressive disorder, single episode, unspecified: Secondary | ICD-10-CM | POA: Insufficient documentation

## 2019-09-29 DIAGNOSIS — F17210 Nicotine dependence, cigarettes, uncomplicated: Secondary | ICD-10-CM | POA: Insufficient documentation

## 2019-09-29 DIAGNOSIS — Z794 Long term (current) use of insulin: Secondary | ICD-10-CM | POA: Diagnosis not present

## 2019-09-29 DIAGNOSIS — Z7982 Long term (current) use of aspirin: Secondary | ICD-10-CM | POA: Diagnosis not present

## 2019-09-29 DIAGNOSIS — R7989 Other specified abnormal findings of blood chemistry: Secondary | ICD-10-CM | POA: Insufficient documentation

## 2019-09-29 NOTE — Progress Notes (Signed)
Hematology/Oncology Consult note [facility] Telephone:([phone] [phone]   Patient Care Team: Lucille Passy, MD as PCP - General Wellington Hampshire, MD as PCP - Cardiology (Cardiology)  REFERRING PROVIDER: Lucille Passy, MD  CHIEF COMPLAINTS/REASON FOR VISIT:  Evaluation of polycytosis  HISTORY OF PRESENTING ILLNESS:  ARDYS HATAWAY is a 48 y.o. female who was seen in consultation at the request of Deborra Medina, Talia M, MD for evaluation of polycytosis/erythrocytosis Reviewed patient's recent lab work 08/13/2019 labs showed elevated hemoglobin at 16.9,  total white count 13.5, platelet counts 499. Chronic Onset, leukocytosis duration since at least 2010.  Intermittent erythrocytosis since at least 2015 intermittent thrombocytosis since at least 2014. She had another recent CBC on 09/07/2019 which showed slightly lower hemoglobin 15.  Patient reports that that is because she had menstrual.  During the time that she had blood work done. No aggravating or alleviating factors.   Associated signs or symptoms: Denies any unintentional weight loss or night sweating.  She feels very tired.  Context:  Smoking history: Current daily smoking.  27-pack-year smoking  History of blood clots: Patient had a history of CAD, status post bypass.  Carotid artery disease.  Diastolic heart failure Daytime somnolence: Some Family history of polycythemia: Reports that father has a history of " blood being sick" phlebotomy.  INTERVAL HISTORY JUDIE HOLLICK is a 48 y.o. female who has above history reviewed by me today presents for follow up visit for management of erythrocytosis Problems and complaints are listed below: During the interval patient has had blood work done and present to discuss results. Need to decrease smoking.  Currently smokes half pack a day.   Review of Systems  Constitutional: Positive for fatigue. Negative for appetite change, chills and  fever.  HENT:   Negative for hearing loss and voice change.   Eyes: Negative for eye problems.  Respiratory: Negative for chest tightness and cough.   Cardiovascular: Negative for chest pain.  Gastrointestinal: Negative for abdominal distention, abdominal pain and blood in stool.  Endocrine: Negative for hot flashes.  Genitourinary: Negative for difficulty urinating and frequency.   Musculoskeletal: Negative for arthralgias.       Lower extremity cramps  Skin: Negative for itching and rash.  Neurological: Negative for extremity weakness.  Hematological: Negative for adenopathy.  Psychiatric/Behavioral: Negative for confusion.    MEDICAL HISTORY:  Past Medical History:  Diagnosis Date   Arthralgia of temporomandibular joint    CAD, multiple vessel    a. cath 06/29/16: ostLM 40%, ostLAD 40%, pLAD 95%, ost-pLCx 60%, pLCx 95%, mLCx 60%, mRCA 95%, D2 50%, LVSF nl;  b. 07/2016 CABG x 4 (LIMA->LAD, VG->Diag, VG->OM, VG->RCA); c. 08/2016 Cath: 3VD w/ 4/4 patent grafts. LAD distal to LIMA has diff dzs->Med rx.   Carotid arterial disease ([place])    a. 07/2016 s/p R CEA.   Clotting disorder ([place])    Depression    Diastolic dysfunction    a. echo 06/28/16: EF 50-55%, mild inf wall HK, GR1DD, mild MR, RV sys fxn nl, mildly dilated LA, PASP nl   Fatty liver disease, nonalcoholic 5400   HLD (hyperlipidemia)    Labile hypertension    a. prior renal ngiogram negative for RAS in 03/2016; b. catecholamines and metanephrines normal, mildly elevated renin with normal aldosterone and normal ratio in 02/2016   Obesity    PTSD (post-traumatic stress disorder)    Tobacco abuse    a. 2018 - cut back from 2 ppd  to 0.5 ppd.   Type 2 diabetes mellitus [facility]) January 2017    SURGICAL HISTORY: Past Surgical History:  Procedure Laterality Date   ABDOMINAL AORTOGRAM W/LOWER EXTREMITY [name] 10/15/2018   Procedure: ABDOMINAL AORTOGRAM W/LOWER EXTREMITY;  Surgeon: Wellington Hampshire, MD;  Location: [place]  CV LAB;  Service: Cardiovascular;  Laterality: [name];   ABDOMINAL AORTOGRAM W/LOWER EXTREMITY Bilateral 08/19/2019   Procedure: ABDOMINAL AORTOGRAM W/LOWER EXTREMITY;  Surgeon: Wellington Hampshire, MD;  Location: [place] CV LAB;  Service: Cardiovascular;  Laterality: Bilateral;   CARDIAC CATHETERIZATION [name] 06/29/2016   Procedure: Left Heart Cath and Coronary Angiography;  Surgeon: Minna Merritts, MD;  Location: [place] CV LAB;  Service: Cardiovascular;  Laterality: [name];   CARDIAC CATHETERIZATION [name] 08/29/2016   Procedure: Left Heart Cath and Cors/Grafts Angiography;  Surgeon: Wellington Hampshire, MD;  Location: [place] CV LAB;  Service: Cardiovascular;  Laterality: [name];   CESAREAN SECTION     CHOLECYSTECTOMY     CORONARY ARTERY BYPASS GRAFT [name] 07/06/2016   Procedure: CORONARY ARTERY BYPASS GRAFTING (CABG) x four, using left internal mammary artery and right leg greater saphenous vein harvested endoscopically;  Surgeon: Ivin Poot, MD;  Location: [place];  Service: Open Heart Surgery;  Laterality: [name];   ENDARTERECTOMY Right 07/06/2016   Procedure: ENDARTERECTOMY CAROTID;  Surgeon: Rosetta Posner, MD;  Location: [place];  Service: Vascular;  Laterality: Right;   PERIPHERAL VASCULAR CATHETERIZATION [name] 04/18/2016   Procedure: Renal Angiography;  Surgeon: Wellington Hampshire, MD;  Location: [place] CV LAB;  Service: Cardiovascular;  Laterality: [name];   PERIPHERAL VASCULAR INTERVENTION Left 10/15/2018   Procedure: PERIPHERAL VASCULAR INTERVENTION;  Surgeon: Wellington Hampshire, MD;  Location: [place] CV LAB;  Service: Cardiovascular;  Laterality: Left;  Left superficial femoral   TEE WITHOUT CARDIOVERSION [name] 07/06/2016   Procedure: TRANSESOPHAGEAL ECHOCARDIOGRAM (TEE);  Surgeon: Ivin Poot, MD;  Location: [place];  Service: Open Heart Surgery;  Laterality: [name];   TONSILLECTOMY      SOCIAL HISTORY: Social History   Socioeconomic History   Marital status: Married    Spouse  name: Not on file   Number of children: 1   Years of education: 14   Highest education level: Not on file  Occupational History   Occupation: Disability  Social Designer, fashion/clothing strain: Not on file   Food insecurity    Worry: Not on file    Inability: Not on file   Transportation needs    Medical: Not on file    Non-medical: Not on file  Tobacco Use   Smoking status: Current Every Day Smoker    Packs/day: 1.00    Years: 27.00    Pack years: 27.00    Types: Cigarettes   Smokeless tobacco: Never Used   Tobacco comment: patient smokes when she drives.   Substance and Sexual Activity   Alcohol use: Yes    Alcohol/week: 0.0 standard drinks    Comment: socially   Drug use: No   Sexual activity: Yes    Partners: Male    Birth control/protection: None  Lifestyle   Physical activity    Days per week: Not on file    Minutes per session: Not on file   Stress: Not on file  Relationships   Social connections    Talks on phone: Not on file    Gets together: Not on file    Attends religious service: Not on file    Active  member of club or organization: Not on file    Attends meetings of clubs or organizations: Not on file    Relationship status: Not on file   Intimate partner violence    Fear of current or ex partner: Not on file    Emotionally abused: Not on file    Physically abused: Not on file    Forced sexual activity: Not on file  Other Topics Concern   Not on file  Social History Narrative   Lives with husband   Caffeine use: Drinks 2 cups coffee per day   Right handed    FAMILY HISTORY: Family History  Adopted: Yes  Problem Relation Age of Onset   Diabetes Mother    Diabetes Father    Alcohol abuse Father    Heart disease Father    Drug abuse Father    Stroke Sister    Anxiety disorder Sister     ALLERGIES:  is allergic to chantix [varenicline tartrate].  MEDICATIONS:  Current Outpatient Medications  Medication Sig  Dispense Refill   albuterol (PROVENTIL HFA;VENTOLIN HFA) 108 (90 Base) MCG/ACT inhaler Inhale 2 puffs into the lungs every 6 (six) hours as needed for wheezing. 1 Inhaler 0   Alirocumab (PRALUENT) 150 MG/ML SOAJ Inject 150 mg into the skin every 14 (fourteen) days. (Patient taking differently: Inject 150 mg into the skin every 14 (fourteen) days. Last dose 3 week ago) 2 pen 11   amLODipine (NORVASC) 10 MG tablet Take 1 tablet (10 mg total) by mouth daily. 90 tablet 3   aspirin EC 81 MG tablet Take 1 tablet (81 mg total) by mouth daily. 90 tablet 3   benztropine (COGENTIN) 0.5 MG tablet Take 1 tablet (0.5 mg total) by mouth daily. 90 tablet 0   [START ON 09/30/2019] calcium carbonate (CALCIUM 600) 600 MG TABS tablet Take 1 tablet (600 mg total) by mouth 2 (two) times daily with a meal. 180 tablet 0   carvedilol (COREG) 25 MG tablet TAKE 1 TABLET (25 MG TOTAL) BY MOUTH 2 (TWO) TIMES DAILY WITH A MEAL. 60 tablet 5   chlorproMAZINE (THORAZINE) 50 MG tablet Take 1 tablet (50 mg total) by mouth at bedtime. 90 tablet 0   Cholecalciferol (VITAMIN D3) 1.25 MG (50000 UT) CAPS Take 1 capsule by mouth once a week.     [START ON 09/30/2019] Cholecalciferol (VITAMIN D3) 125 MCG (5000 UT) CAPS Take 1 capsule (5,000 Units total) by mouth daily with breakfast. Take along with calcium and magnesium. 90 capsule 0   clonazePAM (KLONOPIN) 0.5 MG tablet Take 1 tablet (0.5 mg total) by mouth 3 (three) times daily as needed for anxiety. 90 tablet 2   cloNIDine (CATAPRES) 0.1 MG tablet Take 1 tablet (0.1 mg total) by mouth 2 (two) times daily. 60 tablet 5   clopidogrel (PLAVIX) 75 MG tablet Take 1 tablet (75 mg total) by mouth daily. 30 tablet 3   cyclobenzaprine (FEXMID) 7.5 MG tablet TAKE 1 TABLET BY MOUTH 3 TIMES A DAY AS NEEDED FOR MUSCLE SPASMS 90 tablet 5   FLUoxetine HCl 60 MG TABS Take 60 mg by mouth daily. 90 tablet 0   Insulin Glargine (LANTUS SOLOSTAR) 100 UNIT/ML Solostar Pen Inject 40 Units into  the skin daily. (Patient taking differently: Inject 40 Units into the skin at bedtime. ) 5 pen PRN   insulin lispro (HUMALOG KWIKPEN) 100 UNIT/ML KwikPen UAD: inj 5u Sq bid; 25u prn flow chart 15 mL 11   Insulin Syringe-Needle U-100 29G X  1/2" 0.3 ML MISC 1 each by Does not apply route 2 (two) times daily. 30 each 0   isosorbide mononitrate (IMDUR) 30 MG 24 hr tablet Take 1 tablet (30 mg total) by mouth daily. 90 tablet 1   lamoTRIgine (LAMICTAL) 200 MG tablet Take 1 tablet (200 mg total) by mouth daily. 90 tablet 0   losartan (COZAAR) 100 MG tablet Take 1 tablet (100 mg total) by mouth daily. 90 tablet 3   magic mouthwash SOLN Take 15 mLs by mouth 3 (three) times daily as needed for mouth pain. 250 mL 1   [START ON 09/30/2019] Magnesium 500 MG CAPS Take 1 capsule (500 mg total) by mouth 2 (two) times daily at 8 am and 10 pm. 180 capsule 0   nicotine (NICODERM CQ) 14 mg/24hr patch Place 1 patch (14 mg total) onto the skin daily. 28 patch 0   nitroGLYCERIN (NITROSTAT) 0.4 MG SL tablet PLACE 1 TABLET (0.4 MG TOTAL) UNDER THE TONGUE EVERY 5 (FIVE) MINUTES AS NEEDED FOR CHEST PAIN. 25 tablet PRN   ondansetron (ZOFRAN) 4 MG tablet Take 1 tablet (4 mg total) by mouth daily as needed. 20 tablet 5   [START ON 09/30/2019] pregabalin (LYRICA) 225 MG capsule Take 1 capsule (225 mg total) by mouth 2 (two) times daily. Take 1 capsule (150 mg total) 3 (three) times daily. 60 capsule 2   rosuvastatin (CRESTOR) 10 MG tablet Take 1 tablet (10 mg total) by mouth daily. 90 tablet 3   spironolactone (ALDACTONE) 25 MG tablet Take 1 tablet (25 mg total) by mouth daily. 90 tablet 3   hydrALAZINE (APRESOLINE) 25 MG tablet      naloxone (NARCAN) nasal spray 4 mg/0.1 mL 4 mg (contents of 1 nasal spray) as a single dose in one nostril; may repeat every 2 to 3 minutes in alternating nostrils until medical assistance becomes available (Patient not taking: Reported on 09/28/2019) 1 kit 0   [START ON 09/30/2019]  oxyCODONE (OXY IR/ROXICODONE) 5 MG immediate release tablet Take 1 tablet (5 mg total) by mouth 2 (two) times daily as needed for severe pain. Must last 30 days. (Patient not taking: Reported on 09/28/2019) 60 tablet 0   [START ON 10/30/2019] oxyCODONE (OXY IR/ROXICODONE) 5 MG immediate release tablet Take 1 tablet (5 mg total) by mouth 2 (two) times daily as needed for severe pain. Must last 30 days. (Patient not taking: Reported on 09/15/2019) 60 tablet 0   [START ON 11/29/2019] oxyCODONE (OXY IR/ROXICODONE) 5 MG immediate release tablet Take 1 tablet (5 mg total) by mouth 2 (two) times daily as needed for severe pain. Must last 30 days. (Patient not taking: Reported on 09/15/2019) 60 tablet 0   No current facility-administered medications for this visit.      PHYSICAL EXAMINATION: ECOG PERFORMANCE STATUS: 1 - Symptomatic but completely ambulatory Vitals:   09/29/19 1355  BP: 123/80  Pulse: 90  Resp: 18  Temp: 98.4 F (36.9 C)   Filed Weights   09/29/19 1355  Weight: 209 lb 14.4 oz (95.2 kg)    Physical Exam Constitutional:      General: She is not in acute distress.    Appearance: She is obese.  HENT:     Head: Normocephalic and atraumatic.  Eyes:     General: No scleral icterus.    Pupils: Pupils are equal, round, and reactive to light.  Neck:     Musculoskeletal: Normal range of motion and neck supple.  Cardiovascular:     Rate  and Rhythm: Normal rate and regular rhythm.     Heart sounds: Normal heart sounds.  Pulmonary:     Effort: Pulmonary effort is normal. No respiratory distress.     Breath sounds: No wheezing.  Abdominal:     General: Bowel sounds are normal. There is no distension.     Palpations: Abdomen is soft. There is no mass.     Tenderness: There is no abdominal tenderness.  Musculoskeletal: Normal range of motion.        General: No deformity.  Skin:    General: Skin is warm and dry.     Findings: No erythema or rash.  Neurological:     Mental  Status: She is alert and oriented to person, place, and time.     Cranial Nerves: No cranial nerve deficit.     Coordination: Coordination normal.  Psychiatric:        Behavior: Behavior normal.        Thought Content: Thought content normal.     RADIOGRAPHIC STUDIES: I have personally reviewed the radiological images as listed and agreed with the findings in the report. Mr Hip Left Wo Contrast  Result Date: 08/13/2019 CLINICAL DATA:  Left hip severe pain with walking and sitting EXAM: MR OF THE LEFT HIP WITHOUT CONTRAST TECHNIQUE: Multiplanar, multisequence MR imaging was performed. No intravenous contrast was administered. COMPARISON:  05/28/2019 FINDINGS: Bones: There is no evidence of acute fracture, dislocation or avascular necrosis. The femoral head is normal in shape, configuration and sphericity. No osseous bump at the femoral head neck junction. The visualized bony pelvis appears normal. The visualized sacroiliac joints and symphysis pubis appear normal. Articular cartilage and labrum Articular cartilage: There is mild superior chondral thinning with a small marginal osteophyte seen at the superior femoral head. Labrum: There is no gross labral tear or paralabral abnormality. Joint or bursal effusion Joint effusion: No significant hip joint effusion. Bursae: No focal periarticular fluid collection. Muscles and tendons Muscles and tendons: Increased signal seen at the insertion site of the gluteal tendons. The hamstrings and iliopsoas tendons are intact. Other findings Miscellaneous: The visualized internal pelvic contents appear unremarkable. IMPRESSION: Mild right hip osteoarthritis. Insertional gluteal tendinosis. Electronically Signed   By: Prudencio Pair M.D.   On: 08/13/2019 13:43   [facility] C-arm 1-60 Min No Report  Result Date: 08/27/2019 Fluoro was used, but no Radiologist interpretation will be provided. Please refer to "NOTES" tab for provider progress note.    LABORATORY  DATA:  I have reviewed the data as listed Lab Results  Component Value Date   WBC 15.4 (H) 09/15/2019   HGB 16.7 (H) 09/15/2019   HCT 48.6 (H) 09/15/2019   MCV 87.9 09/15/2019   PLT 453 (H) 09/15/2019   Recent Labs    10/10/18 0926 01/07/19 0924  01/07/19 1148 02/25/19 1056 03/17/19 0937 06/29/19 1040 08/13/19 1427 08/19/19 1145 09/07/19 0846  NA 133*  --    < > 137 129*  --  131* 133* 135 137  K 4.0  --   --  4.8 4.9  --  4.5 4.6 4.0 3.8  CL 96*  --   --  96 94*  --  95* 91* 96* 103  CO2 27  --   --   --  23  --  26 21  --  26  GLUCOSE 266*  --    < > 209* 599*  --  438* 319* 147* 87  BUN 9  --    < >  13 10  --  '11 8 12 9  '$ CREATININE 0.74  --   --  0.71 0.62  --  0.66 0.68 0.50 0.50  CALCIUM 9.6  --   --  10.7* 9.9  --  10.1 11.2*  --  10.0  GFRNONAA >60  --   --  103 >60  --   --  106  --   --   GFRAA >60  --   --  119 >60  --   --  122  --   --   PROT 7.2 7.8  --  7.8  --  7.3 6.7  --   --  7.0  ALBUMIN 3.7 4.2  --  4.6  --   --  4.0  --   --  4.1  AST 35 20  --  17  --   --  12  --   --  16  ALT 67* 46*  --   --   --   --  31  --   --  23  ALKPHOS 90 88  --  99  --   --  109  --   --  73  BILITOT 0.3 0.4  --  0.3  --   --  0.2  --   --  0.4  BILIDIR <0.1 <0.1  --   --   --   --   --   --   --   --   IBILI NOT CALCULATED NOT CALCULATED  --   --   --   --   --   --   --   --    < > = values in this interval not displayed.   Iron/TIBC/Ferritin/ %Sat    Component Value Date/Time   IRON 113 09/15/2019 1101   IRON 104 04/20/2013 1515   TIBC 416 09/15/2019 1101   TIBC 400 04/20/2013 1515   FERRITIN 54 09/15/2019 1101   FERRITIN 59 04/20/2013 1515   IRONPCTSAT 27 09/15/2019 1101   IRONPCTSAT 30 06/29/2019 1040        ASSESSMENT & PLAN:  1. Leukocytosis, unspecified type   2. Erythrocytosis   3. Thrombocytosis ([place])   4. Tobacco abuse   Labs are reviewed and discussed with patient. JAK2 V617F with reflex to other mutations are negative.  Less likely primary  process. Increased carbon monoxide level indicating that erythrocytosis most likely secondary to smoking. Smoking cessation was discussed with patient.  She is motivated.  Leukocytosis, predominantly neutrophilia.  Mild thrombocytosis-Negative hepatitis panel negativeHIV Both possibly reactive due to smoking.  Continue to monitor.   Orders Placed This Encounter  Procedures   CBC with Differential/Platelet    Standing Status:   Future    Standing Expiration Date:   09/28/2020    We spent sufficient time to discuss many aspect of care, questions were answered to patient's satisfaction. The patient knows to call the clinic with any problems questions or concerns.  Cc Lucille Passy, MD  Return of visit: 6 months.     Earlie Server, MD, PhD 09/29/2019

## 2019-09-30 ENCOUNTER — Ambulatory Visit: Payer: BC Managed Care – PPO | Admitting: Pain Medicine

## 2019-09-30 ENCOUNTER — Encounter: Payer: Self-pay | Admitting: Family Medicine

## 2019-09-30 ENCOUNTER — Ambulatory Visit: Payer: BC Managed Care – PPO

## 2019-09-30 DIAGNOSIS — M6281 Muscle weakness (generalized): Secondary | ICD-10-CM | POA: Diagnosis not present

## 2019-09-30 DIAGNOSIS — M25552 Pain in left hip: Secondary | ICD-10-CM | POA: Diagnosis not present

## 2019-09-30 DIAGNOSIS — R262 Difficulty in walking, not elsewhere classified: Secondary | ICD-10-CM | POA: Diagnosis not present

## 2019-09-30 IMAGING — MR MRI OF THE LEFT HIP WITHOUT CONTRAST
6 series · 34 of 40 positions shown · non-contrast
Comparison: 05/28/2019

CLINICAL DATA: Left hip severe pain with walking and sitting

EXAM:
MR OF THE LEFT HIP WITHOUT CONTRAST
TECHNIQUE: Multiplanar, multisequence MR imaging was performed. No intravenous
contrast was administered.

[Series 2: T1 · coronal · left · 4.0mm · 0.47mm/px · 2 of 40 slices shown]
[im 1/40]
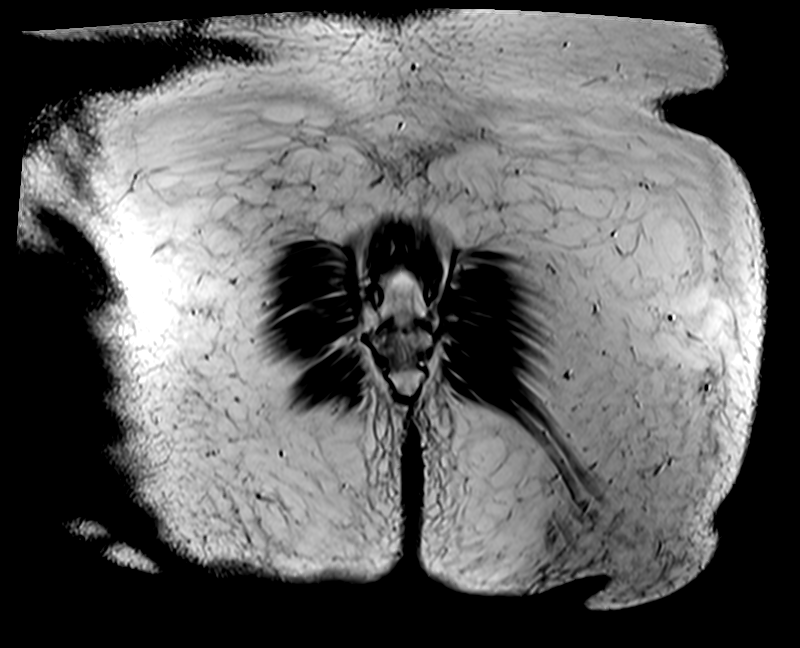
[im 6/40]
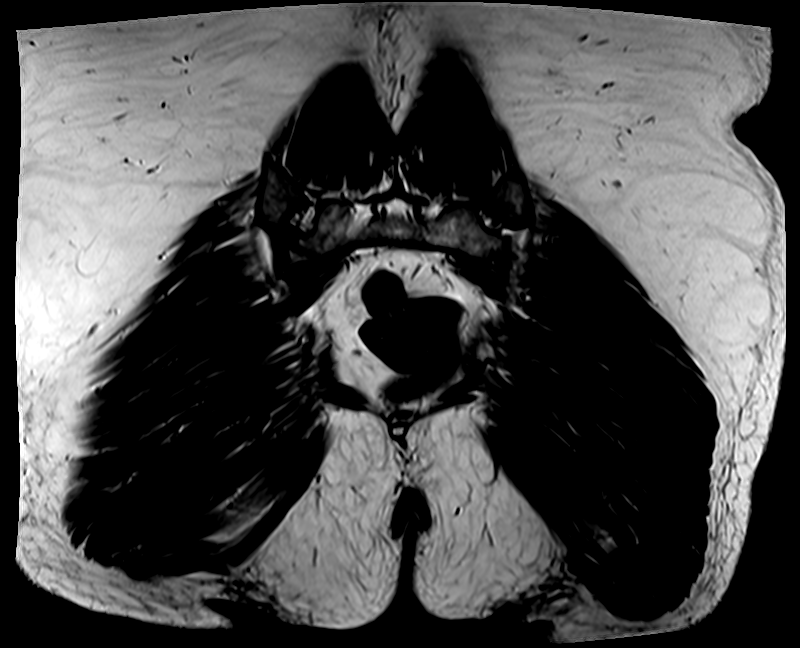

[Series 3: T2 fat-sat · coronal · left · 4.0mm · 1.19mm/px · 8 of 40 slices shown (1 of 2)]
[im 1/40]
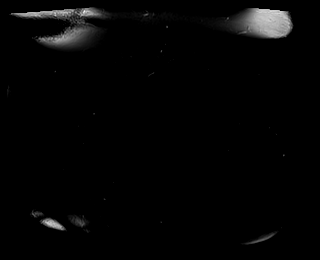
[im 6/40]
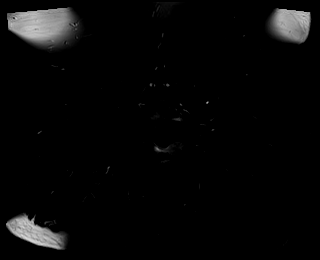
[im 12/40]
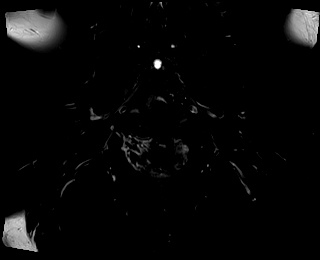
[im 17/40]
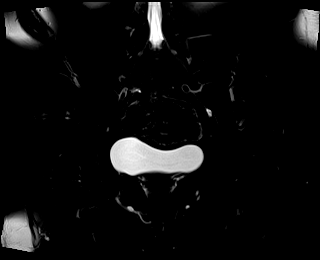
[im 23/40]
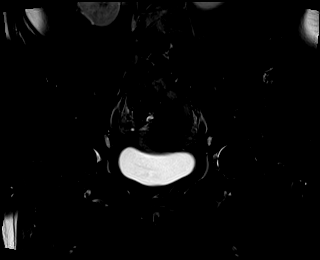
[im 28/40]
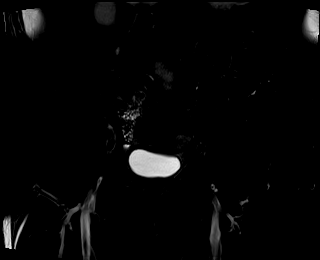
[im 34/40]
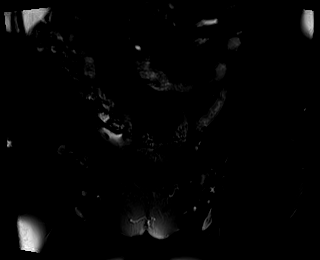
[im 40/40]
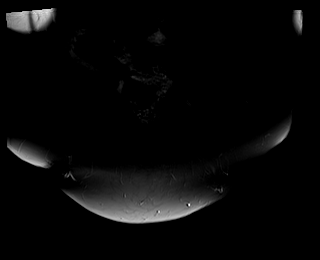

[Series 4: T2 fat-sat · axial · left · 4.0mm · 0.35mm/px · z∈[-180,-35]mm · 6 of 30 slices shown (2 of 2)]
[im 1/30]
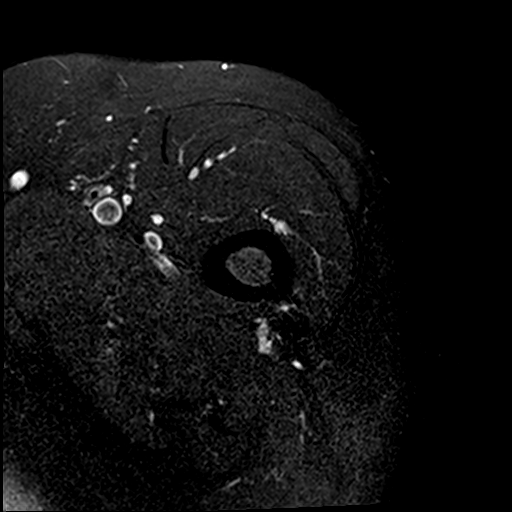
[im 6/30]
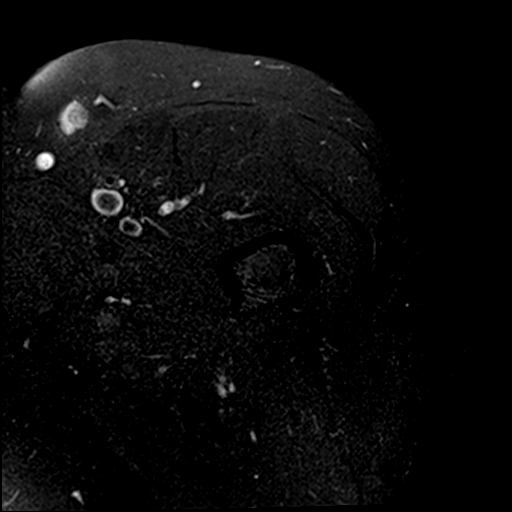
[im 12/30]
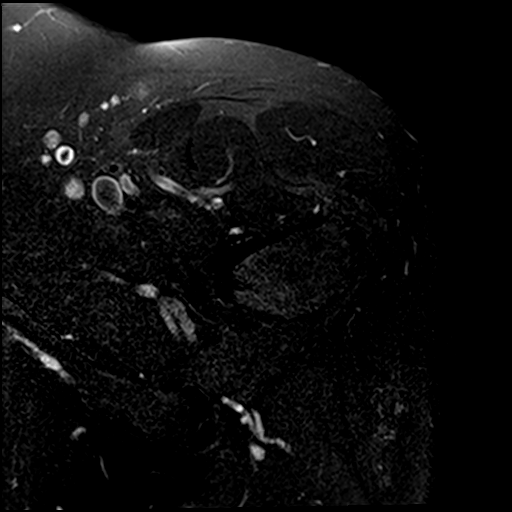
[im 18/30]
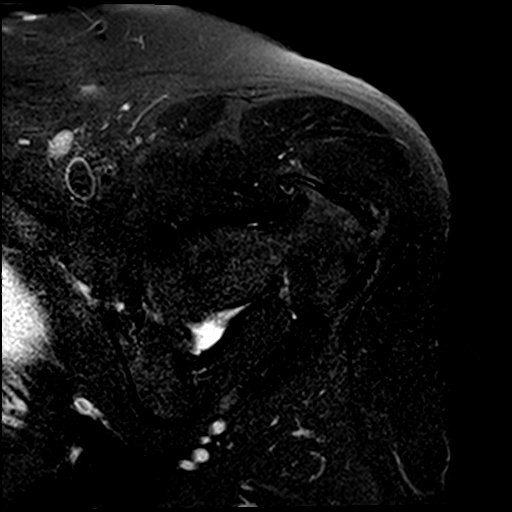
[im 24/30]
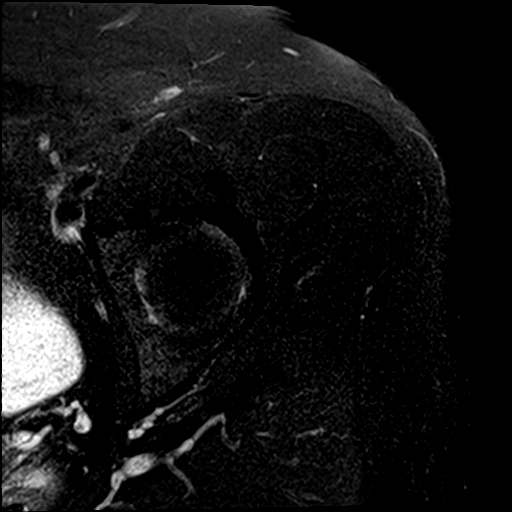
[im 30/30]
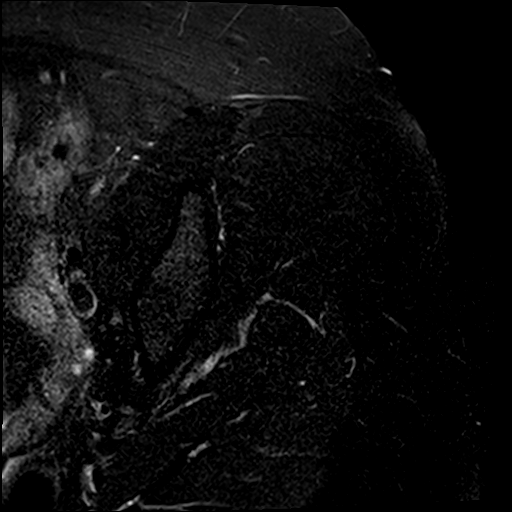

[Series 5: PD fat-sat · sagittal · left · 4.0mm · 0.70mm/px · 6 of 29 slices shown (1 of 3)]
[im 1/29]
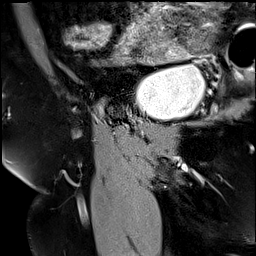
[im 6/29]
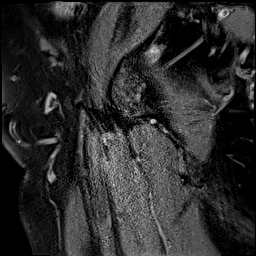
[im 12/29]
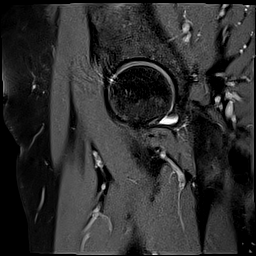
[im 17/29]
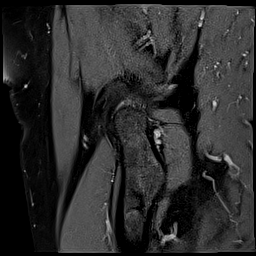
[im 23/29]
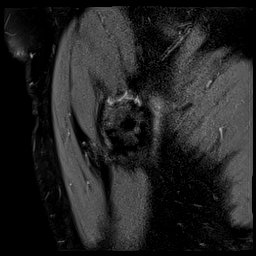
[im 29/29]
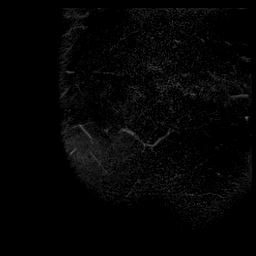

[Series 6: PD fat-sat · coronal · left · 4.0mm · 0.70mm/px · 6 of 29 slices shown (2 of 3)]
[im 1/29]
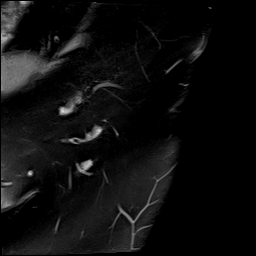
[im 6/29]
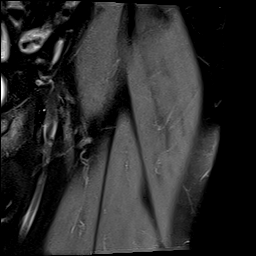
[im 12/29]
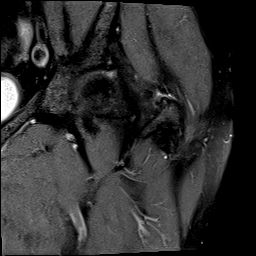
[im 17/29]
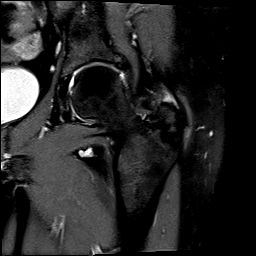
[im 23/29]
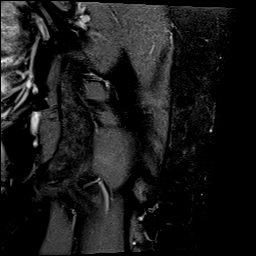
[im 29/29]
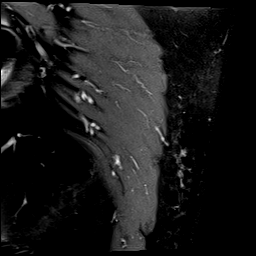

[Series 7: PD fat-sat · axial · left · 4.0mm · 0.62mm/px · z∈[-180,-35]mm · 6 of 30 slices shown (3 of 3)]
[im 1/30]
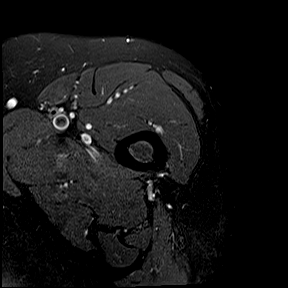
[im 6/30]
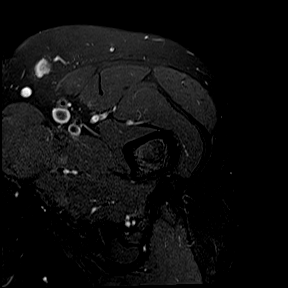
[im 12/30]
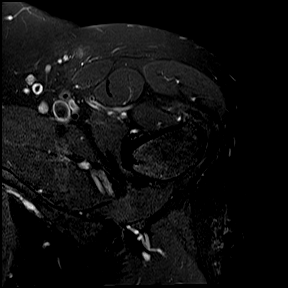
[im 18/30]
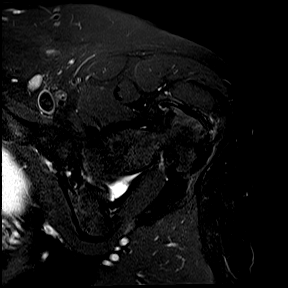
[im 24/30]
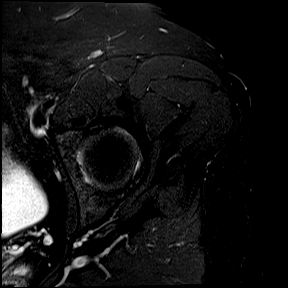
[im 30/30]
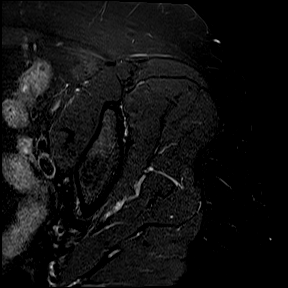

[34 of 40 positions shown; findings below may reference images not displayed]

FINDINGS: Bones: There is no evidence of acute fracture, dislocation or
avascular necrosis. The femoral head is normal in shape,
configuration and sphericity. No osseous bump at the femoral head
neck junction. The visualized bony pelvis appears normal. The
visualized sacroiliac joints and symphysis pubis appear normal.

Articular cartilage and labrum

Articular cartilage: There is mild superior chondral thinning with a
small marginal osteophyte seen at the superior femoral head.

Labrum: There is no gross labral tear or paralabral abnormality.

Joint or bursal effusion

Joint effusion: No significant hip joint effusion.

Bursae: No focal periarticular fluid collection.

Muscles and tendons

Muscles and tendons: Increased signal seen at the insertion site of
the gluteal tendons. The hamstrings and iliopsoas tendons are
intact.

Other findings

Miscellaneous: The visualized internal pelvic contents appear
unremarkable.
IMPRESSION: Mild right hip osteoarthritis.

Insertional gluteal tendinosis.

## 2019-09-30 NOTE — Therapy (Signed)
[facility] [address], [place], [address] Phone: [phone]   Fax:  [phone]  Physical Therapy Treatment  Patient Details  Name: Erika [name] MRN: [id number] Date of Birth: 1971-07-30 Referring Provider (PT): Gwenlyn Perking, MD   Encounter Date: 09/30/2019  PT End of Session - 09/30/19 0906    Visit Number  2    Number of Visits  13    Date for PT Re-Evaluation  11/12/19    PT Start Time  0906    PT Stop Time  0945    PT Time Calculation (min)  39 min    Activity Tolerance  Patient tolerated treatment well    Behavior During Therapy  [facility] for tasks assessed/performed       Past Medical History:  Diagnosis Date  . Arthralgia of temporomandibular joint   . CAD, multiple vessel    a. cath 06/29/16: ostLM 40%, ostLAD 40%, pLAD 95%, ost-pLCx 60%, pLCx 95%, mLCx 60%, mRCA 95%, D2 50%, LVSF nl;  b. 07/2016 CABG x 4 (LIMA->LAD, VG->Diag, VG->OM, VG->RCA); c. 08/2016 Cath: 3VD w/ 4/4 patent grafts. LAD distal to LIMA has diff dzs->Med rx.  . Carotid arterial disease ([place])    a. 07/2016 s/p R CEA.  . Clotting disorder ([place])   . Depression   . Diastolic dysfunction    a. echo 06/28/16: EF 50-55%, mild inf wall HK, GR1DD, mild MR, RV sys fxn nl, mildly dilated LA, PASP nl  . Fatty liver disease, nonalcoholic [id number]  . HLD (hyperlipidemia)   . Labile hypertension    a. prior renal ngiogram negative for RAS in 03/2016; b. catecholamines and metanephrines normal, mildly elevated renin with normal aldosterone and normal ratio in 02/2016  . Obesity   . PTSD (post-traumatic stress disorder)   . Tobacco abuse    a. 2018 - cut back from 2 ppd to 0.5 ppd.  . Type 2 diabetes mellitus [facility]) January 2017    Past Surgical History:  Procedure Laterality Date  . ABDOMINAL AORTOGRAM W/LOWER EXTREMITY [name] 10/15/2018   Procedure: ABDOMINAL AORTOGRAM W/LOWER EXTREMITY;  Surgeon: Wellington Hampshire, MD;  Location: [facility];  Service: Cardiovascular;  Laterality: [name];  . ABDOMINAL AORTOGRAM W/LOWER EXTREMITY Bilateral 08/19/2019   Procedure: ABDOMINAL AORTOGRAM W/LOWER EXTREMITY;  Surgeon: Wellington Hampshire, MD;  Location: [facility];  Service: Cardiovascular;  Laterality: Bilateral;  . CARDIAC CATHETERIZATION [name] 06/29/2016   Procedure: Left Heart Cath and Coronary Angiography;  Surgeon: Minna Merritts, MD;  Location: [facility];  Service: Cardiovascular;  Laterality: [name];  . CARDIAC CATHETERIZATION [name] 08/29/2016   Procedure: Left Heart Cath and Cors/Grafts Angiography;  Surgeon: Wellington Hampshire, MD;  Location: [facility];  Service: Cardiovascular;  Laterality: [name];  . CESAREAN SECTION    . CHOLECYSTECTOMY    . CORONARY ARTERY BYPASS GRAFT [name] 07/06/2016   Procedure: CORONARY ARTERY BYPASS GRAFTING (CABG) x four, using left internal mammary artery and right leg greater saphenous vein harvested endoscopically;  Surgeon: Ivin Poot, MD;  Location: [facility];  Service: Open Heart Surgery;  Laterality: [name];  . ENDARTERECTOMY Right 07/06/2016   Procedure: ENDARTERECTOMY CAROTID;  Surgeon: Rosetta Posner, MD;  Location: [facility];  Service: Vascular;  Laterality: Right;  . PERIPHERAL VASCULAR CATHETERIZATION [name] 04/18/2016   Procedure: Renal Angiography;  Surgeon: Wellington Hampshire, MD;  Location: [facility];  Service: Cardiovascular;  Laterality: [name];  .  PERIPHERAL VASCULAR INTERVENTION Left 10/15/2018   Procedure: PERIPHERAL VASCULAR INTERVENTION;  Surgeon: Wellington Hampshire, MD;  Location: [facility];  Service: Cardiovascular;  Laterality: Left;  Left superficial femoral  . TEE WITHOUT CARDIOVERSION [name] 07/06/2016   Procedure: TRANSESOPHAGEAL ECHOCARDIOGRAM (TEE);  Surgeon: Ivin Poot, MD;  Location: [facility];  Service: Open Heart Surgery;  Laterality: [name];  . TONSILLECTOMY      There were no vitals filed for this visit.  Subjective Assessment - 09/30/19 0911     Subjective  L hip is about the same. Used a cane yesterday but too high. 2/10 short distance walking today. 9/10 with long distance walking.    Pertinent History  Pt states that her L hip pain is osteoarthritis, not a torn tendon. Her diagnosis changed after her MRI. Has had L hip pain for years, gradual onset. Has not had PT yet for her hip. Had chiropractic treatment which helped a little bit. Last time she had chirpractic treatment was last week. Only goes there if she needs to go. No specific schedule. No back or hip surgeries.  Also has PAD L LE, had 2 stents but still completely blocked. The stents are in her L medial distal thigh.  States having L low back pain to L lateral hip. Started using B axillary crutches last week for long distances.    Patient Stated Goals  Walk greater than 200 ft. Be more active to be able to go to the [place] and walk    Currently in Pain?  Yes    Pain Score  2     Pain Onset  More than a month ago                               PT Education - 09/30/19 0930    Education Details  E-stim for isometric muscle contraction passively secondary to blood pressure levels    Person(s) Educated  Patient    Methods  Explanation;Demonstration;Tactile cues;Verbal cues    Comprehension  Returned demonstration;Verbalized understanding      Objective   No latex band allergies   Pt states her blood pressure is not controlled. Some days her blood pressure is perfect some days it is not.    Blood pressure L arm sitting, mechanically take, normal cuff: 180/97, HR 96 Did not drink coffee Ate oatmeal for breakfast   After rest:  170/98 BP manually taken.    Monitor blood pressure   E-stim x 15 min [place] E-stim L glute med and max, 10 seconds on/10 seconds off to promote glute med and max muscle isometric activation 56 mA to 63 mA.   Pt was recommended to use her TENS at home for pain control and hopefully muscle activation for passive  isometric work    Response to treatment Pt tolerated session well without aggravation of symptoms.    Clinical impression Performed [place] E-stim to L glute med and max to promote isometric muscle contraction passively secondary to elevated blood pressure. No exercises performed. No complain of pain in L hip after session. Mainly analgesic numbing effect. Pt will benefit from contniued skilled physical therapy services to decrease pain, improve strength and function.     Try standing L lateral shift correction at wall next visit if appropriate      PT Short Term Goals - 09/28/19 1020      PT SHORT TERM GOAL #1   Title  Patient will be independent with her HEP to decrease hip pain, improve strength, function, and ability to ambulate longer distances more comfortably.    Time  3    Period  Weeks    Status  New    Target Date  10/22/19        PT Long Term Goals - 09/28/19 1021      PT LONG TERM GOAL #1   Title  Patient will have a decrease in L hip pain to 3/10 or less at worst to promote ability to ambulate longer distances as well as negotiate stairs more comfortably.    Baseline  7/10 L hip pain at most for the past 3 months (09/28/2019)    Time  6    Period  Weeks    Status  New    Target Date  11/12/19      PT LONG TERM GOAL #2   Title  Patient will be able to ambulate for at least 6 minutes for at least 700 ft prior to needing to sit down due to pain as a demonstration of improved ability to ambulate longer distances.    Baseline  Pt only able to ambulate 392 ft for 3 minutes prior to sitting down due to L hip pain (09/28/2019)    Time  6    Period  Weeks    Status  New    Target Date  11/12/19      PT LONG TERM GOAL #3   Title  Pt will improve B LE strength by at least 1/2 MMT grade to promote ability to perform functional tasks.    Time  6    Period  Weeks    Status  New    Target Date  11/12/19            Plan - 09/30/19 0931    Clinical  Impression Statement  Performed [place] E-stim to L glute med and max to promote isometric muscle contraction passively secondary to elevated blood pressure. No exercises performed. No complain of pain in L hip after session. Mainly analgesic numbing effect. Pt will benefit from contniued skilled physical therapy services to decrease pain, improve strength and function.    Personal Factors and Comorbidities  Age;Comorbidity 3+;Fitness;Past/Current Experience;Time since onset of injury/illness/exacerbation    Comorbidities  DM, HTN, PVD, clotting disorder, CAD, current smoker    Examination-Activity Limitations  Bend;Carry;Lift;Locomotion Level;Sleep;Squat;Stairs    Stability/Clinical Decision Making  Evolving/Moderate complexity    Rehab Potential  Fair    PT Frequency  2x / week    PT Duration  6 weeks    PT Treatment/Interventions  Electrical Stimulation;Iontophoresis 4mg /ml Dexamethasone;Traction;Ultrasound;Gait training;Stair training;Functional mobility training;Therapeutic activities;Therapeutic exercise;Balance training;Neuromuscular re-education;Patient/family education;Manual techniques;Dry needling;Spinal Manipulations;Joint Manipulations   manipulations if appropriate   PT Next Visit Plan  hip strengthening, femoral control, trunk strengthening, manual techniques, modalities PRN    Consulted and Agree with Plan of Care  Patient       Patient will benefit from skilled therapeutic intervention in order to improve the following deficits and impairments:  Pain, Postural dysfunction, Improper body mechanics, Difficulty walking, Decreased strength  Visit Diagnosis: Pain in left hip  Muscle weakness (generalized)  Difficulty in walking, not elsewhere classified     Problem List Patient Active Problem List   Diagnosis Date Noted  . Thrombocytosis ([place]) 09/16/2019  . Erythrocytosis 09/16/2019  . Well woman exam without gynecological exam 09/06/2019  . Hypercalcemia 09/06/2019  .  Leukocytosis 09/06/2019  . Polycythemia  09/06/2019  . Chronic hip pain (Left) 08/27/2019  . Osteoarthritis of hip (Left) 08/27/2019  . Gluteal tendonitis of buttock (Left) 08/27/2019  . Radial nerve palsy (Right) 07/15/2019  . Neuropathy of right radial nerve 06/30/2019  . Abnormal bruising 06/25/2019  . Right hip pain 05/28/2019  . History of carotid endarterectomy (Right) 03/04/2019  . Pain medication agreement signed 03/04/2019  . Atypical facial pain (Right) 01/27/2019  . Chronic ear pain (Right) 01/27/2019  . Chronic jaw pain (Right) 01/27/2019  . Geniculate Neuralgia (Right) 01/27/2019  . Vitamin D deficiency 01/19/2019  . Neurogenic pain 01/19/2019  . Chronic anticoagulation (PLAVIX) 01/19/2019  . Chronic pain syndrome 01/07/2019  . Long term current use of opiate analgesic 01/07/2019  . Long term prescription benzodiazepine use 01/07/2019  . Pharmacologic therapy 01/07/2019  . Disorder of skeletal system 01/07/2019  . Problems influencing health status 01/07/2019  . Chronic headaches (Primary Area of Pain) (Right) 01/07/2019  . Opiate use 09/24/2018  . Myalgia 06/24/2018  . Chronic ankle pain (Bilateral) 12/27/2017  . Tendinopathy of right gluteus medius 12/27/2017  . Tendinopathy of gluteus medius (Left) 12/27/2017  . Bilateral hip pain 12/26/2017  . Other chronic pain 10/17/2017  . Elevated troponin I level 05/21/2017  . Major depressive disorder, recurrent episode, moderate ([place]) 11/19/2016  . Insomnia 10/30/2016  . Constipation 07/25/2016  . S/P CABG x 4 07/06/2016  . Bradycardia   . CAD (coronary artery disease)   . Carotid stenosis   . CAD in native artery 06/29/2016  . Elevated troponin 06/28/2016  . Essential hypertension, malignant 06/28/2016  . Tobacco abuse 06/28/2016  . Essential hypertension   . Malignant hypertension   . Diabetes mellitus due to underlying condition, uncontrolled, with circulatory complication, with long-term current use of insulin  ([place])   . Chest pain with high risk for cardiac etiology 06/27/2016  . NSTEMI (non-ST elevated myocardial infarction) ([place]) 06/27/2016  . Proteinuria 03/15/2016  . Renal artery stenosis ([place]) 03/15/2016  . MDD (major depressive disorder) 10/17/2015  . Agoraphobia with panic attacks 04/25/2015  . HTN (hypertension), malignant 10/20/2013  . Cluster headache 03/20/2012  . HLD (hyperlipidemia) 07/26/2010  . ADJUSTMENT DISORDER WITH MIXED FEATURES 07/26/2010    Joneen Boers PT, DPT   09/30/2019, 12:47 PM  [facility] [address] [place], [address] Phone: [phone]   Fax:  [phone]  Name: Erika [name] MRN: [id number] Date of Birth: 1971/04/15

## 2019-10-01 ENCOUNTER — Encounter: Payer: Self-pay | Admitting: Endocrinology

## 2019-10-01 ENCOUNTER — Ambulatory Visit (INDEPENDENT_AMBULATORY_CARE_PROVIDER_SITE_OTHER): Payer: BC Managed Care – PPO | Admitting: Endocrinology

## 2019-10-01 ENCOUNTER — Other Ambulatory Visit: Payer: Self-pay

## 2019-10-01 VITALS — BP 110/80 | HR 103 | Ht 67.0 in | Wt 211.4 lb

## 2019-10-01 DIAGNOSIS — E0865 Diabetes mellitus due to underlying condition with hyperglycemia: Secondary | ICD-10-CM | POA: Diagnosis not present

## 2019-10-01 DIAGNOSIS — Z794 Long term (current) use of insulin: Secondary | ICD-10-CM

## 2019-10-01 DIAGNOSIS — E0859 Diabetes mellitus due to underlying condition with other circulatory complications: Secondary | ICD-10-CM

## 2019-10-01 DIAGNOSIS — IMO0002 Reserved for concepts with insufficient information to code with codable children: Secondary | ICD-10-CM

## 2019-10-01 LAB — POCT GLYCOSYLATED HEMOGLOBIN (HGB A1C): Hemoglobin A1C: 11.2 % — AB (ref 4.0–5.6)

## 2019-10-01 MED ORDER — LANTUS SOLOSTAR 100 UNIT/ML ~~LOC~~ SOPN: 50.0000 [IU] | PEN_INJECTOR | SUBCUTANEOUS | 99 refills | Status: DC

## 2019-10-01 MED ORDER — INSULIN LISPRO (1 UNIT DIAL) 100 UNIT/ML (KWIKPEN): 3.0000 [IU] | PEN_INJECTOR | Freq: Three times a day (TID) | SUBCUTANEOUS | 11 refills | Status: DC

## 2019-10-01 NOTE — Progress Notes (Signed)
Subjective:    Patient ID: Erika [name], female    DOB: 03/15/1971, 48 y.o.   MRN: [id number]  HPI pt is referred by for diabetes.  Pt states DM was dx'ed in 2019; she has mild if any neuropathy of the lower extremities; however, she has associated CAD and PAD; she has been on insulin since 2018; pt says her diet and exercise are not good; she has never had GDM, pancreatitis, pancreatic surgery, severe hypoglycemia.  She had DKA.  She takes Lantus 40 units QHS, and SS-novolog (she averages 10-25 total units per day).  Pt says she sometimes misses the insulin.   Past Medical History:  Diagnosis Date  . Arthralgia of temporomandibular joint   . CAD, multiple vessel    a. cath 06/29/16: ostLM 40%, ostLAD 40%, pLAD 95%, ost-pLCx 60%, pLCx 95%, mLCx 60%, mRCA 95%, D2 50%, LVSF nl;  b. 07/2016 CABG x 4 (LIMA->LAD, VG->Diag, VG->OM, VG->RCA); c. 08/2016 Cath: 3VD w/ 4/4 patent grafts. LAD distal to LIMA has diff dzs->Med rx.  . Carotid arterial disease ([place])    a. 07/2016 s/p R CEA.  . Clotting disorder ([place])   . Depression   . Diastolic dysfunction    a. echo 06/28/16: EF 50-55%, mild inf wall HK, GR1DD, mild MR, RV sys fxn nl, mildly dilated LA, PASP nl  . Fatty liver disease, nonalcoholic 9163  . HLD (hyperlipidemia)   . Labile hypertension    a. prior renal ngiogram negative for RAS in 03/2016; b. catecholamines and metanephrines normal, mildly elevated renin with normal aldosterone and normal ratio in 02/2016  . Obesity   . PTSD (post-traumatic stress disorder)   . Tobacco abuse    a. 2018 - cut back from 2 ppd to 0.5 ppd.  . Type 2 diabetes mellitus [facility]) January 2017    Past Surgical History:  Procedure Laterality Date  . ABDOMINAL AORTOGRAM W/LOWER EXTREMITY [name] 10/15/2018   Procedure: ABDOMINAL AORTOGRAM W/LOWER EXTREMITY;  Surgeon: Wellington Hampshire, MD;  Location: [place] [facility];  Service: Cardiovascular;  Laterality: [name];  . ABDOMINAL AORTOGRAM W/LOWER EXTREMITY Bilateral  08/19/2019   Procedure: ABDOMINAL AORTOGRAM W/LOWER EXTREMITY;  Surgeon: Wellington Hampshire, MD;  Location: [place] [facility];  Service: Cardiovascular;  Laterality: Bilateral;  . CARDIAC CATHETERIZATION [name] 06/29/2016   Procedure: Left Heart Cath and Coronary Angiography;  Surgeon: Minna Merritts, MD;  Location: [place] [facility];  Service: Cardiovascular;  Laterality: [name];  . CARDIAC CATHETERIZATION [name] 08/29/2016   Procedure: Left Heart Cath and Cors/Grafts Angiography;  Surgeon: Wellington Hampshire, MD;  Location: [place] [facility];  Service: Cardiovascular;  Laterality: [name];  . CESAREAN SECTION    . CHOLECYSTECTOMY    . CORONARY ARTERY BYPASS GRAFT [name] 07/06/2016   Procedure: CORONARY ARTERY BYPASS GRAFTING (CABG) x four, using left internal mammary artery and right leg greater saphenous vein harvested endoscopically;  Surgeon: Ivin Poot, MD;  Location: [place];  Service: Open Heart Surgery;  Laterality: [name];  . ENDARTERECTOMY Right 07/06/2016   Procedure: ENDARTERECTOMY CAROTID;  Surgeon: Rosetta Posner, MD;  Location: [place];  Service: Vascular;  Laterality: Right;  . PERIPHERAL VASCULAR CATHETERIZATION [name] 04/18/2016   Procedure: Renal Angiography;  Surgeon: Wellington Hampshire, MD;  Location: [place] [facility];  Service: Cardiovascular;  Laterality: [name];  . PERIPHERAL VASCULAR INTERVENTION Left 10/15/2018   Procedure: PERIPHERAL VASCULAR INTERVENTION;  Surgeon: Wellington Hampshire, MD;  Location: [place] [facility];  Service: Cardiovascular;  Laterality: Left;  Left superficial femoral  . TEE WITHOUT CARDIOVERSION [name] 07/06/2016   Procedure: TRANSESOPHAGEAL ECHOCARDIOGRAM (TEE);  Surgeon: Ivin Poot, MD;  Location: [place];  Service: Open Heart Surgery;  Laterality: [name];  . TONSILLECTOMY      Social History   Socioeconomic History  . Marital status: Married    Spouse name: Not on file  . Number of children: 1  . Years of education: 90  . Highest education level: Not on file   Occupational History  . Occupation: Disability  Social Needs  . Financial resource strain: Not on file  . Food insecurity    Worry: Not on file    Inability: Not on file  . Transportation needs    Medical: Not on file    Non-medical: Not on file  Tobacco Use  . Smoking status: Current Every Day Smoker    Packs/day: 1.00    Years: 27.00    Pack years: 27.00    Types: Cigarettes  . Smokeless tobacco: Never Used  . Tobacco comment: patient smokes when she drives.   Substance and Sexual Activity  . Alcohol use: Yes    Alcohol/week: 0.0 standard drinks    Comment: socially  . Drug use: No  . Sexual activity: Yes    Partners: Male    Birth control/protection: None  Lifestyle  . Physical activity    Days per week: Not on file    Minutes per session: Not on file  . Stress: Not on file  Relationships  . Social Herbalist on phone: Not on file    Gets together: Not on file    Attends religious service: Not on file    Active member of club or organization: Not on file    Attends meetings of clubs or organizations: Not on file    Relationship status: Not on file  . Intimate partner violence    Fear of current or ex partner: Not on file    Emotionally abused: Not on file    Physically abused: Not on file    Forced sexual activity: Not on file  Other Topics Concern  . Not on file  Social History Narrative   Lives with husband   Caffeine use: Drinks 2 cups coffee per day   Right handed    Current Outpatient Medications on File Prior to Visit  Medication Sig Dispense Refill  . albuterol (PROVENTIL HFA;VENTOLIN HFA) 108 (90 Base) MCG/ACT inhaler Inhale 2 puffs into the lungs every 6 (six) hours as needed for wheezing. 1 Inhaler 0  . Alirocumab (PRALUENT) 150 MG/ML SOAJ Inject 150 mg into the skin every 14 (fourteen) days. (Patient taking differently: Inject 150 mg into the skin every 14 (fourteen) days. Last dose 3 week ago) 2 pen 11  . amLODipine (NORVASC) 10 MG  tablet Take 1 tablet (10 mg total) by mouth daily. 90 tablet 3  . aspirin EC 81 MG tablet Take 1 tablet (81 mg total) by mouth daily. 90 tablet 3  . benztropine (COGENTIN) 0.5 MG tablet Take 1 tablet (0.5 mg total) by mouth daily. 90 tablet 0  . calcium carbonate (CALCIUM 600) 600 MG TABS tablet Take 1 tablet (600 mg total) by mouth 2 (two) times daily with a meal. 180 tablet 0  . carvedilol (COREG) 25 MG tablet TAKE 1 TABLET (25 MG TOTAL) BY MOUTH 2 (TWO) TIMES DAILY WITH A MEAL. 60 tablet 5  . chlorproMAZINE (THORAZINE) 50 MG tablet Take 1  tablet (50 mg total) by mouth at bedtime. 90 tablet 0  . Cholecalciferol (VITAMIN D3) 1.25 MG (50000 UT) CAPS Take 1 capsule by mouth once a week.    . Cholecalciferol (VITAMIN D3) 125 MCG (5000 UT) CAPS Take 1 capsule (5,000 Units total) by mouth daily with breakfast. Take along with calcium and magnesium. 90 capsule 0  . clonazePAM (KLONOPIN) 0.5 MG tablet Take 1 tablet (0.5 mg total) by mouth 3 (three) times daily as needed for anxiety. 90 tablet 2  . cloNIDine (CATAPRES) 0.1 MG tablet Take 1 tablet (0.1 mg total) by mouth 2 (two) times daily. 60 tablet 5  . clopidogrel (PLAVIX) 75 MG tablet Take 1 tablet (75 mg total) by mouth daily. 30 tablet 3  . cyclobenzaprine (FEXMID) 7.5 MG tablet TAKE 1 TABLET BY MOUTH 3 TIMES A DAY AS NEEDED FOR MUSCLE SPASMS 90 tablet 5  . FLUoxetine HCl 60 MG TABS Take 60 mg by mouth daily. 90 tablet 0  . hydrALAZINE (APRESOLINE) 25 MG tablet     . Insulin Syringe-Needle U-100 29G X 1/2" 0.3 ML MISC 1 each by Does not apply route 2 (two) times daily. 30 each 0  . isosorbide mononitrate (IMDUR) 30 MG 24 hr tablet Take 1 tablet (30 mg total) by mouth daily. 90 tablet 1  . lamoTRIgine (LAMICTAL) 200 MG tablet Take 1 tablet (200 mg total) by mouth daily. 90 tablet 0  . losartan (COZAAR) 100 MG tablet Take 1 tablet (100 mg total) by mouth daily. 90 tablet 3  . magic mouthwash SOLN Take 15 mLs by mouth 3 (three) times daily as needed  for mouth pain. 250 mL 1  . Magnesium 500 MG CAPS Take 1 capsule (500 mg total) by mouth 2 (two) times daily at 8 am and 10 pm. 180 capsule 0  . naloxone (NARCAN) nasal spray 4 mg/0.1 mL 4 mg (contents of 1 nasal spray) as a single dose in one nostril; may repeat every 2 to 3 minutes in alternating nostrils until medical assistance becomes available 1 kit 0  . nicotine (NICODERM CQ) 14 mg/24hr patch Place 1 patch (14 mg total) onto the skin daily. 28 patch 0  . nitroGLYCERIN (NITROSTAT) 0.4 MG SL tablet PLACE 1 TABLET (0.4 MG TOTAL) UNDER THE TONGUE EVERY 5 (FIVE) MINUTES AS NEEDED FOR CHEST PAIN. 25 tablet PRN  . ondansetron (ZOFRAN) 4 MG tablet Take 1 tablet (4 mg total) by mouth daily as needed. 20 tablet 5  . oxyCODONE (OXY IR/ROXICODONE) 5 MG immediate release tablet Take 1 tablet (5 mg total) by mouth 2 (two) times daily as needed for severe pain. Must last 30 days. 60 tablet 0  . [START ON 11/29/2019] oxyCODONE (OXY IR/ROXICODONE) 5 MG immediate release tablet Take 1 tablet (5 mg total) by mouth 2 (two) times daily as needed for severe pain. Must last 30 days. 60 tablet 0  . rosuvastatin (CRESTOR) 10 MG tablet Take 1 tablet (10 mg total) by mouth daily. 90 tablet 3  . spironolactone (ALDACTONE) 25 MG tablet Take 1 tablet (25 mg total) by mouth daily. 90 tablet 3   No current facility-administered medications on file prior to visit.     Allergies  Allergen Reactions  . Chantix [Varenicline Tartrate] Other (See Comments)    Feels "crazy"     Family History  Adopted: Yes  Problem Relation Age of Onset  . Diabetes Mother   . Diabetes Father   . Alcohol abuse Father   . Heart  disease Father   . Drug abuse Father   . Stroke Sister   . Anxiety disorder Sister     BP 110/80 (BP Location: Right Arm, Patient Position: Sitting, Cuff Size: Normal)   Pulse (!) 103   Ht '5\' 7"'$  (1.702 m)   Wt 211 lb 6.4 oz (95.9 kg)   SpO2 98%   BMI 33.11 kg/m    Review of Systems denies blurry  vision, chest pain, sob, n/v, urinary frequency, mualgias, excessive diaphoresis, memory loss, cold intolerance, rhinorrhea, and easy bruising. She has exertional pain of the LE's.  She has lost 20 lbs (unintentional).  She has intermitt headache.  No change in chronic depression.      Objective:   Physical Exam VS: see vs page GEN: no distress HEAD: head: no deformity eyes: no periorbital swelling, no proptosis external nose and ears are normal NECK: a healed scar is present.  I do not appreciate a nodule in the thyroid or elsewhere in the neck CHEST WALL: no deformity.  Old healed surgical scar (median sternotomy) LUNGS: clear to auscultation CV: reg rate and rhythm, no murmur ABD: abdomen is soft, nontender.  no hepatosplenomegaly.  not distended.  no hernia MUSCULOSKELETAL: muscle bulk and strength are grossly normal.  no obvious joint swelling.  gait is normal and steady EXTEMITIES: no deformity.  no ulcer on the feet.  feet are of normal color and temp.  no edema PULSES: dorsalis pedis intact bilat.  no carotid bruit NEURO:  cn 2-12 grossly intact.   readily moves all 4's.  sensation is intact to touch on the feet SKIN:  Normal texture and temperature.  No rash or suspicious lesion is visible.   NODES:  None palpable at the neck PSYCH: alert, well-oriented.  Does not appear anxious nor depressed.   Lab Results  Component Value Date   CREATININE 0.50 09/07/2019   BUN 9 09/07/2019   NA 137 09/07/2019   K 3.8 09/07/2019   CL 103 09/07/2019   CO2 26 09/07/2019   Lab Results  Component Value Date   HGBA1C 11.2 (A) 10/01/2019   I have reviewed outside records, and summarized: Pt was noted to have elevated a1c, and referred here.  CAD, PAD, and HTN were addressed at cardiol appt.   I personally reviewed electrocardiogram tracing (today): Indication: CAD Impression: ST.  Old ASMI.  No hypertrophy.  Compared to 08/13/19: no significant change.       Assessment & Plan:   Insulin-requiring type 2 DM, with PAD: severe exacerbation.  Noncompliance with insulin: she is not a candidate for multiple daily injections  Patient Instructions  good diet and exercise significantly improve the control of your diabetes.  please let me know if you wish to be referred to a dietician.  high blood sugar is very risky to your health.  you should see an eye doctor and dentist every year.  It is very important to get all recommended vaccinations.  Controlling your blood pressure and cholesterol drastically reduces the damage diabetes does to your body.  Those who smoke should quit.  Please discuss these with your doctor.  check your blood sugar 4 times a day: before the 3 meals, and at bedtime.  also check if you have symptoms of your blood sugar being too high or too low.  please keep a record of the readings and bring it to your next appointment here (or you can bring the meter itself).  You can write it on any piece  of paper.  please call us sooner if your blood sugar goes below 70, or if you have a lot of readings over 200.   We will need to take this complex situation in stages.   For now, please: Take Lantus, 50 units each morning, and: Take Humalog 3 units 3 times a day (just before each meal), only if blood sugar is over 250. Please come back for a follow-up appointment in 1 month.

## 2019-10-01 NOTE — Patient Instructions (Addendum)
good diet and exercise significantly improve the control of your diabetes.  please let me know if you wish to be referred to a dietician.  high blood sugar is very risky to your health.  you should see an eye doctor and dentist every year.  It is very important to get all recommended vaccinations.  Controlling your blood pressure and cholesterol drastically reduces the damage diabetes does to your body.  Those who smoke should quit.  Please discuss these with your doctor.  check your blood sugar 4 times a day: before the 3 meals, and at bedtime.  also check if you have symptoms of your blood sugar being too high or too low.  please keep a record of the readings and bring it to your next appointment here (or you can bring the meter itself).  You can write it on any piece of paper.  please call us sooner if your blood sugar goes below 70, or if you have a lot of readings over 200.   We will need to take this complex situation in stages.   For now, please: Take Lantus, 50 units each morning, and: Take Humalog 3 units 3 times a day (just before each meal), only if blood sugar is over 250. Please come back for a follow-up appointment in 1 month.

## 2019-10-05 ENCOUNTER — Ambulatory Visit: Payer: BC Managed Care – PPO

## 2019-10-05 ENCOUNTER — Other Ambulatory Visit: Payer: Self-pay | Admitting: Family Medicine

## 2019-10-05 ENCOUNTER — Other Ambulatory Visit: Payer: Self-pay

## 2019-10-05 DIAGNOSIS — M6281 Muscle weakness (generalized): Secondary | ICD-10-CM | POA: Diagnosis not present

## 2019-10-05 DIAGNOSIS — M25552 Pain in left hip: Secondary | ICD-10-CM

## 2019-10-05 DIAGNOSIS — R262 Difficulty in walking, not elsewhere classified: Secondary | ICD-10-CM | POA: Diagnosis not present

## 2019-10-05 NOTE — Therapy (Signed)
[facility] [facility] [address] [place], [address] Phone: [phone]   Fax:  [phone]  Physical Therapy Treatment  Patient Details  Name: Erika [name] MRN: [id number] Date of Birth: 1971-08-05 Referring Provider (PT): Gwenlyn Perking, MD   Encounter Date: 10/05/2019  PT End of Session - 10/05/19 1041    Visit Number  3    Number of Visits  13    Date for PT Re-Evaluation  11/12/19    PT Start Time  [id number]    PT Stop Time  1123    PT Time Calculation (min)  43 min    Activity Tolerance  Patient tolerated treatment well    Behavior During Therapy  [facility] for tasks assessed/performed       Past Medical History:  Diagnosis Date  . Arthralgia of temporomandibular joint   . CAD, multiple vessel    a. cath 06/29/16: ostLM 40%, ostLAD 40%, pLAD 95%, ost-pLCx 60%, pLCx 95%, mLCx 60%, mRCA 95%, D2 50%, LVSF nl;  b. 07/2016 CABG x 4 (LIMA->LAD, VG->Diag, VG->OM, VG->RCA); c. 08/2016 Cath: 3VD w/ 4/4 patent grafts. LAD distal to LIMA has diff dzs->Med rx.  . Carotid arterial disease ([place])    a. 07/2016 s/p R CEA.  . Clotting disorder ([place])   . Depression   . Diastolic dysfunction    a. echo 06/28/16: EF 50-55%, mild inf wall HK, GR1DD, mild MR, RV sys fxn nl, mildly dilated LA, PASP nl  . Fatty liver disease, nonalcoholic [id number]  . HLD (hyperlipidemia)   . Labile hypertension    a. prior renal ngiogram negative for RAS in 03/2016; b. catecholamines and metanephrines normal, mildly elevated renin with normal aldosterone and normal ratio in 02/2016  . Obesity   . PTSD (post-traumatic stress disorder)   . Tobacco abuse    a. 2018 - cut back from 2 ppd to 0.5 ppd.  . Type 2 diabetes mellitus [facility]) January 2017    Past Surgical History:  Procedure Laterality Date  . ABDOMINAL AORTOGRAM W/LOWER EXTREMITY [name] 10/15/2018   Procedure: ABDOMINAL AORTOGRAM W/LOWER EXTREMITY;  Surgeon: Wellington Hampshire, MD;  Location: [place] [facility];  Service: Cardiovascular;  Laterality: [name];  . ABDOMINAL AORTOGRAM W/LOWER EXTREMITY Bilateral 08/19/2019   Procedure: ABDOMINAL AORTOGRAM W/LOWER EXTREMITY;  Surgeon: Wellington Hampshire, MD;  Location: [place] [facility];  Service: Cardiovascular;  Laterality: Bilateral;  . CARDIAC CATHETERIZATION [name] 06/29/2016   Procedure: Left Heart Cath and Coronary Angiography;  Surgeon: Minna Merritts, MD;  Location: [place] [facility];  Service: Cardiovascular;  Laterality: [name];  . CARDIAC CATHETERIZATION [name] 08/29/2016   Procedure: Left Heart Cath and Cors/Grafts Angiography;  Surgeon: Wellington Hampshire, MD;  Location: [place] [facility];  Service: Cardiovascular;  Laterality: [name];  . CESAREAN SECTION    . CHOLECYSTECTOMY    . CORONARY ARTERY BYPASS GRAFT [name] 07/06/2016   Procedure: CORONARY ARTERY BYPASS GRAFTING (CABG) x four, using left internal mammary artery and right leg greater saphenous vein harvested endoscopically;  Surgeon: Ivin Poot, MD;  Location: [place];  Service: Open Heart Surgery;  Laterality: [name];  . ENDARTERECTOMY Right 07/06/2016   Procedure: ENDARTERECTOMY CAROTID;  Surgeon: Rosetta Posner, MD;  Location: [place];  Service: Vascular;  Laterality: Right;  . PERIPHERAL VASCULAR CATHETERIZATION [name] 04/18/2016   Procedure: Renal Angiography;  Surgeon: Wellington Hampshire, MD;  Location: [place] [facility];  Service: Cardiovascular;  Laterality: [name];  .  PERIPHERAL VASCULAR INTERVENTION Left 10/15/2018   Procedure: PERIPHERAL VASCULAR INTERVENTION;  Surgeon: Wellington Hampshire, MD;  Location: [place] [facility];  Service: Cardiovascular;  Laterality: Left;  Left superficial femoral  . TEE WITHOUT CARDIOVERSION [name] 07/06/2016   Procedure: TRANSESOPHAGEAL ECHOCARDIOGRAM (TEE);  Surgeon: Ivin Poot, MD;  Location: [place];  Service: Open Heart Surgery;  Laterality: [name];  . TONSILLECTOMY      There were no vitals filed for this visit.  Subjective Assessment - 10/05/19 1043     Subjective  Went to her endochronologist last week and her blood pressure was normal. Might not be normal today. Sometimes its good, sometimes its not. 7/10 L hip pain currently. Yesterday and Saturday did extra walking but did not use AD. Uses ice for her hip.    Pertinent History  Pt states that her L hip pain is osteoarthritis, not a torn tendon. Her diagnosis changed after her MRI. Has had L hip pain for years, gradual onset. Has not had PT yet for her hip. Had chiropractic treatment which helped a little bit. Last time she had chirpractic treatment was last week. Only goes there if she needs to go. No specific schedule. No back or hip surgeries.  Also has PAD L LE, had 2 stents but still completely blocked. The stents are in her L medial distal thigh.  States having L low back pain to L lateral hip. Started using B axillary crutches last week for long distances.    Patient Stated Goals  Walk greater than 200 ft. Be more active to be able to go to the [place] and walk    Currently in Pain?  Yes    Pain Score  7     Pain Onset  More than a month ago                               PT Education - 10/05/19 1053    Education Details  ther-ex, HEP (if blood pressure is at an appropriate level, pt states having a blood pressure cuff)    Person(s) Educated  Patient    Methods  Explanation;Demonstration;Tactile cues;Verbal cues;Handout    Comprehension  Returned demonstration;Verbalized understanding       Objective   No latex band allergies  Pt states her blood pressure is not controlled. Some days her blood pressure is perfect some days it is not.    Monitor blood pressure   Blood pressure L arm sitting, mechanically take, normal cuff: 138/84, HR 82 Drank 4 cups of coffee. Did not eat breakfast.    Therapeutic exercise  Checked AD: axillary crutch for proper height. To be used in the meantime to take pressure off L lateral hip during gait to decrease  irritation.   Seated hip abduction isometrics, hips less than 90 degrees flexion 40% effort 1 minute with 1 minute rest x 4  R S/L L hip abduction isometrics 10x3 for 5 second holds  Seated L hip extension isometrics 40% effort 1 minute with 1 minute rest x 5  Seated hip adduction glute and ball squeeze 10x2 with 5 second holds    Try standing L lateral shift correction at wall next visit if appropriate   Improved exercise technique, movement at target joints, use of target muscles after min to mod verbal, visual, tactile cues.       Response to treatment Pt tolerated session well without aggravation of symptoms.  Clinical impression Performed exercises today secondary blood pressure levels appropriate for activity. Worked on isometric glute med and max activation to promote proper stress to tendons to promote healing. Decreased L hip pain with gait after session. Pt will benefit from continued skilled physical therapy services to decrease pain, improve strength, and ability to ambulate more comfortably.          PT Short Term Goals - 09/28/19 1020      PT SHORT TERM GOAL #1   Title  Patient will be independent with her HEP to decrease hip pain, improve strength, function, and ability to ambulate longer distances more comfortably.    Time  3    Period  Weeks    Status  New    Target Date  10/22/19        PT Long Term Goals - 09/28/19 1021      PT LONG TERM GOAL #1   Title  Patient will have a decrease in L hip pain to 3/10 or less at worst to promote ability to ambulate longer distances as well as negotiate stairs more comfortably.    Baseline  7/10 L hip pain at most for the past 3 months (09/28/2019)    Time  6    Period  Weeks    Status  New    Target Date  11/12/19      PT LONG TERM GOAL #2   Title  Patient will be able to ambulate for at least 6 minutes for at least 700 ft prior to needing to sit down due to pain as a demonstration of improved  ability to ambulate longer distances.    Baseline  Pt only able to ambulate 392 ft for 3 minutes prior to sitting down due to L hip pain (09/28/2019)    Time  6    Period  Weeks    Status  New    Target Date  11/12/19      PT LONG TERM GOAL #3   Title  Pt will improve B LE strength by at least 1/2 MMT grade to promote ability to perform functional tasks.    Time  6    Period  Weeks    Status  New    Target Date  11/12/19            Plan - 10/05/19 1044    Clinical Impression Statement  Performed exercises today secondary blood pressure levels appropriate for activity. Worked on isometric glute med and max activation to promote proper stress to tendons to promote healing. Decreased L hip pain with gait after session. Pt will benefit from continued skilled physical therapy services to decrease pain, improve strength, and ability to ambulate more comfortably.    Personal Factors and Comorbidities  Age;Comorbidity 3+;Fitness;Past/Current Experience;Time since onset of injury/illness/exacerbation    Comorbidities  DM, HTN, PVD, clotting disorder, CAD, current smoker    Examination-Activity Limitations  Bend;Carry;Lift;Locomotion Level;Sleep;Squat;Stairs    Stability/Clinical Decision Making  Evolving/Moderate complexity    Rehab Potential  Fair    PT Frequency  2x / week    PT Duration  6 weeks    PT Treatment/Interventions  Electrical Stimulation;Iontophoresis 4mg /ml Dexamethasone;Traction;Ultrasound;Gait training;Stair training;Functional mobility training;Therapeutic activities;Therapeutic exercise;Balance training;Neuromuscular re-education;Patient/family education;Manual techniques;Dry needling;Spinal Manipulations;Joint Manipulations   manipulations if appropriate   PT Next Visit Plan  hip strengthening, femoral control, trunk strengthening, manual techniques, modalities PRN    Consulted and Agree with Plan of Care  Patient  Patient will benefit from skilled therapeutic  intervention in order to improve the following deficits and impairments:  Pain, Postural dysfunction, Improper body mechanics, Difficulty walking, Decreased strength  Visit Diagnosis: Pain in left hip  Muscle weakness (generalized)  Difficulty in walking, not elsewhere classified     Problem List Patient Active Problem List   Diagnosis Date Noted  . Thrombocytosis ([place]) 09/16/2019  . Erythrocytosis 09/16/2019  . Well woman exam without gynecological exam 09/06/2019  . Hypercalcemia 09/06/2019  . Leukocytosis 09/06/2019  . Polycythemia 09/06/2019  . Chronic hip pain (Left) 08/27/2019  . Osteoarthritis of hip (Left) 08/27/2019  . Gluteal tendonitis of buttock (Left) 08/27/2019  . Radial nerve palsy (Right) 07/15/2019  . Neuropathy of right radial nerve 06/30/2019  . Abnormal bruising 06/25/2019  . Right hip pain 05/28/2019  . History of carotid endarterectomy (Right) 03/04/2019  . Pain medication agreement signed 03/04/2019  . Atypical facial pain (Right) 01/27/2019  . Chronic ear pain (Right) 01/27/2019  . Chronic jaw pain (Right) 01/27/2019  . Geniculate Neuralgia (Right) 01/27/2019  . Vitamin D deficiency 01/19/2019  . Neurogenic pain 01/19/2019  . Chronic anticoagulation (PLAVIX) 01/19/2019  . Chronic pain syndrome 01/07/2019  . Long term current use of opiate analgesic 01/07/2019  . Long term prescription benzodiazepine use 01/07/2019  . Pharmacologic therapy 01/07/2019  . Disorder of skeletal system 01/07/2019  . Problems influencing health status 01/07/2019  . Chronic headaches (Primary Area of Pain) (Right) 01/07/2019  . Opiate use 09/24/2018  . Myalgia 06/24/2018  . Chronic ankle pain (Bilateral) 12/27/2017  . Tendinopathy of right gluteus medius 12/27/2017  . Tendinopathy of gluteus medius (Left) 12/27/2017  . Bilateral hip pain 12/26/2017  . Other chronic pain 10/17/2017  . Elevated troponin I level 05/21/2017  . Major depressive disorder, recurrent  episode, moderate ([place]) 11/19/2016  . Insomnia 10/30/2016  . Constipation 07/25/2016  . S/P CABG x 4 07/06/2016  . Bradycardia   . CAD (coronary artery disease)   . Carotid stenosis   . CAD in native artery 06/29/2016  . Elevated troponin 06/28/2016  . Essential hypertension, malignant 06/28/2016  . Tobacco abuse 06/28/2016  . Essential hypertension   . Malignant hypertension   . Diabetes mellitus due to underlying condition, uncontrolled, with circulatory complication, with long-term current use of insulin ([place])   . Chest pain with high risk for cardiac etiology 06/27/2016  . NSTEMI (non-ST elevated myocardial infarction) ([place]) 06/27/2016  . Proteinuria 03/15/2016  . Renal artery stenosis ([place]) 03/15/2016  . MDD (major depressive disorder) 10/17/2015  . Agoraphobia with panic attacks 04/25/2015  . HTN (hypertension), malignant 10/20/2013  . Cluster headache 03/20/2012  . HLD (hyperlipidemia) 07/26/2010  . ADJUSTMENT DISORDER WITH MIXED FEATURES 07/26/2010    Joneen Boers PT, DPT  10/05/2019, 11:38 AM  [place] [facility] [address], [place], [address] Phone: [phone]   Fax:  [phone]  Name: Erika [name] MRN: [id number] Date of Birth: 1971-09-08

## 2019-10-05 NOTE — Patient Instructions (Addendum)
Seated clamshell isometrics at 40% effort  Sitting on your bed (hips less than 90 degrees flexion)   Belt around thighs, which are shoulder width apart,    Press knees out against belt with 40 % effort for 1 minute   Rest for 1-2 minutes   Perform 5 times per set.     Do 3 sets per day. Each set to be performed a few hours apart.     Seated hip extension isometrics   Sitting on a chair,    Squeeze your rear end muscles together and press your L foot onto the floor       with 40 % effort for 1 minute    Rest for 1-2 minutes   Perform 5 times per set.     Do 3 sets per day. Each set to be performed a few hours apart.

## 2019-10-07 ENCOUNTER — Ambulatory Visit: Payer: BC Managed Care – PPO

## 2019-10-07 ENCOUNTER — Other Ambulatory Visit: Payer: Self-pay

## 2019-10-07 DIAGNOSIS — M25552 Pain in left hip: Secondary | ICD-10-CM | POA: Diagnosis not present

## 2019-10-07 DIAGNOSIS — R262 Difficulty in walking, not elsewhere classified: Secondary | ICD-10-CM | POA: Diagnosis not present

## 2019-10-07 DIAGNOSIS — M6281 Muscle weakness (generalized): Secondary | ICD-10-CM

## 2019-10-07 NOTE — Therapy (Signed)
[facility] [address], [place], [address] Phone: [phone]   Fax:  [phone]  Physical Therapy Treatment  Patient Details  Name: Erika [name] MRN: [id number] Date of Birth: 1971/06/07 Referring Provider (PT): Gwenlyn Perking, MD   Encounter Date: 10/07/2019  PT End of Session - 10/07/19 1035    Visit Number  4    Number of Visits  13    Date for PT Re-Evaluation  11/12/19    PT Start Time  [id number]    PT Stop Time  1115    PT Time Calculation (min)  40 min    Activity Tolerance  Patient tolerated treatment well    Behavior During Therapy  [facility] for tasks assessed/performed       Past Medical History:  Diagnosis Date  . Arthralgia of temporomandibular joint   . CAD, multiple vessel    a. cath 06/29/16: ostLM 40%, ostLAD 40%, pLAD 95%, ost-pLCx 60%, pLCx 95%, mLCx 60%, mRCA 95%, D2 50%, LVSF nl;  b. 07/2016 CABG x 4 (LIMA->LAD, VG->Diag, VG->OM, VG->RCA); c. 08/2016 Cath: 3VD w/ 4/4 patent grafts. LAD distal to LIMA has diff dzs->Med rx.  . Carotid arterial disease ([place])    a. 07/2016 s/p R CEA.  . Clotting disorder ([place])   . Depression   . Diastolic dysfunction    a. echo 06/28/16: EF 50-55%, mild inf wall HK, GR1DD, mild MR, RV sys fxn nl, mildly dilated LA, PASP nl  . Fatty liver disease, nonalcoholic [id number]  . HLD (hyperlipidemia)   . Labile hypertension    a. prior renal ngiogram negative for RAS in 03/2016; b. catecholamines and metanephrines normal, mildly elevated renin with normal aldosterone and normal ratio in 02/2016  . Obesity   . PTSD (post-traumatic stress disorder)   . Tobacco abuse    a. 2018 - cut back from 2 ppd to 0.5 ppd.  . Type 2 diabetes mellitus [facility]) January 2017    Past Surgical History:  Procedure Laterality Date  . ABDOMINAL AORTOGRAM W/LOWER EXTREMITY [name] 10/15/2018   Procedure: ABDOMINAL AORTOGRAM W/LOWER EXTREMITY;  Surgeon: Wellington Hampshire, MD;  Location: [place] [facility];  Service: Cardiovascular;  Laterality: [name];  . ABDOMINAL AORTOGRAM W/LOWER EXTREMITY Bilateral 08/19/2019   Procedure: ABDOMINAL AORTOGRAM W/LOWER EXTREMITY;  Surgeon: Wellington Hampshire, MD;  Location: [place] [facility];  Service: Cardiovascular;  Laterality: Bilateral;  . CARDIAC CATHETERIZATION [name] 06/29/2016   Procedure: Left Heart Cath and Coronary Angiography;  Surgeon: Minna Merritts, MD;  Location: [place] [facility];  Service: Cardiovascular;  Laterality: [name];  . CARDIAC CATHETERIZATION [name] 08/29/2016   Procedure: Left Heart Cath and Cors/Grafts Angiography;  Surgeon: Wellington Hampshire, MD;  Location: [place] [facility];  Service: Cardiovascular;  Laterality: [name];  . CESAREAN SECTION    . CHOLECYSTECTOMY    . CORONARY ARTERY BYPASS GRAFT [name] 07/06/2016   Procedure: CORONARY ARTERY BYPASS GRAFTING (CABG) x four, using left internal mammary artery and right leg greater saphenous vein harvested endoscopically;  Surgeon: Ivin Poot, MD;  Location: [place];  Service: Open Heart Surgery;  Laterality: [name];  . ENDARTERECTOMY Right 07/06/2016   Procedure: ENDARTERECTOMY CAROTID;  Surgeon: Rosetta Posner, MD;  Location: [place];  Service: Vascular;  Laterality: Right;  . PERIPHERAL VASCULAR CATHETERIZATION [name] 04/18/2016   Procedure: Renal Angiography;  Surgeon: Wellington Hampshire, MD;  Location: [place] [facility];  Service: Cardiovascular;  Laterality: [name];  .  PERIPHERAL VASCULAR INTERVENTION Left 10/15/2018   Procedure: PERIPHERAL VASCULAR INTERVENTION;  Surgeon: Wellington Hampshire, MD;  Location: [place] [facility];  Service: Cardiovascular;  Laterality: Left;  Left superficial femoral  . TEE WITHOUT CARDIOVERSION [name] 07/06/2016   Procedure: TRANSESOPHAGEAL ECHOCARDIOGRAM (TEE);  Surgeon: Ivin Poot, MD;  Location: [place];  Service: Open Heart Surgery;  Laterality: [name];  . TONSILLECTOMY      There were no vitals filed for this visit.  Subjective Assessment - 10/07/19 1038     Subjective  L hip is ok right now. Has not done a lot of walking or exercise. No pain currently. Had a little bit of pain coming in because she forgot the crutch. The crutch helps it feel better. L hip pain feels better after her isometric HEP.    Pertinent History  Pt states that her L hip pain is osteoarthritis, not a torn tendon. Her diagnosis changed after her MRI. Has had L hip pain for years, gradual onset. Has not had PT yet for her hip. Had chiropractic treatment which helped a little bit. Last time she had chirpractic treatment was last week. Only goes there if she needs to go. No specific schedule. No back or hip surgeries.  Also has PAD L LE, had 2 stents but still completely blocked. The stents are in her L medial distal thigh.  States having L low back pain to L lateral hip. Started using B axillary crutches last week for long distances.    Patient Stated Goals  Walk greater than 200 ft. Be more active to be able to go [place] the [place] and walk    Currently in Pain?  Yes    Pain Score  4    with standing up from the chair   Pain Onset  More than a month ago                               PT Education - 10/07/19 1054    Education Details  ther-ex    [employer]) Educated  Patient    Methods  Explanation;Demonstration;Tactile cues;Verbal cues    Comprehension  Returned demonstration;Verbalized understanding      Objective   No latex band allergies  Pt states her blood pressure is not controlled. Some days her blood pressure is perfect some days it is not.    Monitor blood pressure  Per pt, aerobic activity makes her PAD worse  Blood pressure L arm sitting, mechanically take, normal cuff: 153/81, HR 84 Drank 1 cups of coffee. Ate oatmeal for breakfast    Therapeutic exercise  Sit to stand from regular chair: decreased B femoral control   Sit <> stand with emphasis on femoral control 10x, then 5x   Less L lateral hip pain  Gait with glute  max squeeze 32 ft x 2. L hip pain eased off temporarily during gait  Gait with pt holding 10 lbs on L side 32 ft. Increased L hip pain Gait with pt holding 10 lbs or R side 32 ft. Decreased L hip pain.   SLS on L LE with emphasis on level pelvis, R tip toe and B UE assist 10x5 seconds. Decreased L hip pain, increased R LE muscle soreness  Then with R foot assist on 4 inch step with B UE assist 10x5 seconds for 2 sets  Seated hip adduction ball and glute max squeeze 10x5 seconds for 3 sets  Standing hip abduction L with B UE assist, pain free range  10x3  Standing L hip extension with B UE assist 10x3   Improved exercise technique, movement at target joints, use of target muscles after min to mod verbal, visual, tactile cues.       Response to treatment Pt tolerated session well without aggravation of symptoms.    Clinical impression Continued working on gentle glute med and max muscle activation to promote proper stress to the healing tendons as well as promoting pelvic control to help decrease stress to the glute med tendons during activity. Pt tolerated session well without aggravation of symptoms. Pt will benefit from continued skilled physical therapy services to decrease pain, improve strength and function.         PT Short Term Goals - 09/28/19 1020      PT SHORT TERM GOAL #1   Title  Patient will be independent with her HEP to decrease hip pain, improve strength, function, and ability to ambulate longer distances more comfortably.    Time  3    Period  Weeks    Status  New    Target Date  10/22/19        PT Long Term Goals - 09/28/19 1021      PT LONG TERM GOAL #1   Title  Patient will have a decrease in L hip pain to 3/10 or less at worst to promote ability to ambulate longer distances as well as negotiate stairs more comfortably.    Baseline  7/10 L hip pain at most for the past 3 months (09/28/2019)    Time  6    Period  Weeks    Status  New     Target Date  11/12/19      PT LONG TERM GOAL #2   Title  Patient will be able to ambulate for at least 6 minutes for at least 700 ft prior to needing to sit down due to pain as a demonstration of improved ability to ambulate longer distances.    Baseline  Pt only able to ambulate 392 ft for 3 minutes prior to sitting down due to L hip pain (09/28/2019)    Time  6    Period  Weeks    Status  New    Target Date  11/12/19      PT LONG TERM GOAL #3   Title  Pt will improve B LE strength by at least 1/2 MMT grade to promote ability to perform functional tasks.    Time  6    Period  Weeks    Status  New    Target Date  11/12/19            Plan - 10/07/19 1037    Clinical Impression Statement  Continued working on gentle glute med and max muscle activation to promote proper stress to the healing tendons as well as promoting pelvic control to help decrease stress to the glute med tendons during activity. Pt tolerated session well without aggravation of symptoms. Pt will benefit from continued skilled physical therapy services to decrease pain, improve strength and function.    Personal Factors and Comorbidities  Age;Comorbidity 3+;Fitness;Past/Current Experience;Time since onset of injury/illness/exacerbation    Comorbidities  DM, HTN, PVD, clotting disorder, CAD, current smoker    Examination-Activity Limitations  Bend;Carry;Lift;Locomotion Level;Sleep;Squat;Stairs    Stability/Clinical Decision Making  Evolving/Moderate complexity    Rehab Potential  Fair    PT Frequency  2x / week  PT Duration  6 weeks    PT Treatment/Interventions  Electrical Stimulation;Iontophoresis 4mg /ml Dexamethasone;Traction;Ultrasound;Gait training;Stair training;Functional mobility training;Therapeutic activities;Therapeutic exercise;Balance training;Neuromuscular re-education;Patient/family education;Manual techniques;Dry needling;Spinal Manipulations;Joint Manipulations   manipulations if appropriate   PT  Next Visit Plan  hip strengthening, femoral control, trunk strengthening, manual techniques, modalities PRN    Consulted and Agree with Plan of Care  Patient       Patient will benefit from skilled therapeutic intervention in order to improve the following deficits and impairments:  Pain, Postural dysfunction, Improper body mechanics, Difficulty walking, Decreased strength  Visit Diagnosis: Pain in left hip  Muscle weakness (generalized)  Difficulty in walking, not elsewhere classified     Problem List Patient Active Problem List   Diagnosis Date Noted  . Thrombocytosis ([place]) 09/16/2019  . Erythrocytosis 09/16/2019  . Well woman exam without gynecological exam 09/06/2019  . Hypercalcemia 09/06/2019  . Leukocytosis 09/06/2019  . Polycythemia 09/06/2019  . Chronic hip pain (Left) 08/27/2019  . Osteoarthritis of hip (Left) 08/27/2019  . Gluteal tendonitis of buttock (Left) 08/27/2019  . Radial nerve palsy (Right) 07/15/2019  . Neuropathy of right radial nerve 06/30/2019  . Abnormal bruising 06/25/2019  . Right hip pain 05/28/2019  . History of carotid endarterectomy (Right) 03/04/2019  . Pain medication agreement signed 03/04/2019  . Atypical facial pain (Right) 01/27/2019  . Chronic ear pain (Right) 01/27/2019  . Chronic jaw pain (Right) 01/27/2019  . Geniculate Neuralgia (Right) 01/27/2019  . Vitamin D deficiency 01/19/2019  . Neurogenic pain 01/19/2019  . Chronic anticoagulation (PLAVIX) 01/19/2019  . Chronic pain syndrome 01/07/2019  . Long term current use of opiate analgesic 01/07/2019  . Long term prescription benzodiazepine use 01/07/2019  . Pharmacologic therapy 01/07/2019  . Disorder of skeletal system 01/07/2019  . Problems influencing health status 01/07/2019  . Chronic headaches (Primary Area of Pain) (Right) 01/07/2019  . Opiate use 09/24/2018  . Myalgia 06/24/2018  . Chronic ankle pain (Bilateral) 12/27/2017  . Tendinopathy of right gluteus medius  12/27/2017  . Tendinopathy of gluteus medius (Left) 12/27/2017  . Bilateral hip pain 12/26/2017  . Other chronic pain 10/17/2017  . Elevated troponin I level 05/21/2017  . Major depressive disorder, recurrent episode, moderate ([place]) 11/19/2016  . Insomnia 10/30/2016  . Constipation 07/25/2016  . S/P CABG x 4 07/06/2016  . Bradycardia   . CAD (coronary artery disease)   . Carotid stenosis   . CAD in native artery 06/29/2016  . Elevated troponin 06/28/2016  . Essential hypertension, malignant 06/28/2016  . Tobacco abuse 06/28/2016  . Essential hypertension   . Malignant hypertension   . Diabetes mellitus due to underlying condition, uncontrolled, with circulatory complication, with long-term current use of insulin ([place])   . Chest pain with high risk for cardiac etiology 06/27/2016  . NSTEMI (non-ST elevated myocardial infarction) ([place]) 06/27/2016  . Proteinuria 03/15/2016  . Renal artery stenosis ([place]) 03/15/2016  . MDD (major depressive disorder) 10/17/2015  . Agoraphobia with panic attacks 04/25/2015  . HTN (hypertension), malignant 10/20/2013  . Cluster headache 03/20/2012  . HLD (hyperlipidemia) 07/26/2010  . ADJUSTMENT DISORDER WITH MIXED FEATURES 07/26/2010    Joneen Boers PT, DPT   10/07/2019, 12:02 PM  [facility] [address], [place], [address] Phone: [phone]   Fax:  [phone]  Name: Erika [name] MRN: [id number] Date of Birth: [birth year]

## 2019-10-13 ENCOUNTER — Other Ambulatory Visit: Payer: Self-pay

## 2019-10-13 ENCOUNTER — Ambulatory Visit: Payer: BC Managed Care – PPO

## 2019-10-13 DIAGNOSIS — R262 Difficulty in walking, not elsewhere classified: Secondary | ICD-10-CM

## 2019-10-13 DIAGNOSIS — M25552 Pain in left hip: Secondary | ICD-10-CM

## 2019-10-13 DIAGNOSIS — M6281 Muscle weakness (generalized): Secondary | ICD-10-CM

## 2019-10-13 NOTE — Therapy (Signed)
[facility] [address] [place], [address] Phone: [phone]   Fax:  [phone]  Physical Therapy Screen  Patient Details  Name: Erika [name] MRN: [id number] Date of Birth: Jun 16, 1971 Referring Provider (PT): Gwenlyn Perking, MD   Encounter Date: 10/13/2019  PT End of Session - 10/13/19 [id number]    Visit Number  4    Number of Visits  13    Date for PT Re-Evaluation  11/12/19    PT Start Time  0852    PT Stop Time  0903    PT Time Calculation (min)  11 min    Activity Tolerance  Patient tolerated treatment well    Behavior During Therapy  [facility] for tasks assessed/performed       Past Medical History:  Diagnosis Date  . Arthralgia of temporomandibular joint   . CAD, multiple vessel    a. cath 06/29/16: ostLM 40%, ostLAD 40%, pLAD 95%, ost-pLCx 60%, pLCx 95%, mLCx 60%, mRCA 95%, D2 50%, LVSF nl;  b. 07/2016 CABG x 4 (LIMA->LAD, VG->Diag, VG->OM, VG->RCA); c. 08/2016 Cath: 3VD w/ 4/4 patent grafts. LAD distal to LIMA has diff dzs->Med rx.  . Carotid arterial disease ([employer])    a. 07/2016 s/p R CEA.  . Clotting disorder ([place])   . Depression   . Diastolic dysfunction    a. echo 06/28/16: EF 50-55%, mild inf wall HK, GR1DD, mild MR, RV sys fxn nl, mildly dilated LA, PASP nl  . Fatty liver disease, nonalcoholic [id number]  . HLD (hyperlipidemia)   . Labile hypertension    a. prior renal ngiogram negative for RAS in 03/2016; b. catecholamines and metanephrines normal, mildly elevated renin with normal aldosterone and normal ratio in 02/2016  . Obesity   . PTSD (post-traumatic stress disorder)   . Tobacco abuse    a. 2018 - cut back from 2 ppd to 0.5 ppd.  . Type 2 diabetes mellitus [facility]) January 2017    Past Surgical History:  Procedure Laterality Date  . ABDOMINAL AORTOGRAM W/LOWER EXTREMITY [name] 10/15/2018   Procedure: ABDOMINAL AORTOGRAM W/LOWER EXTREMITY;  Surgeon: Wellington Hampshire, MD;  Location: [place] [facility];  Service: Cardiovascular;  Laterality: [name];  . ABDOMINAL AORTOGRAM W/LOWER EXTREMITY Bilateral 08/19/2019   Procedure: ABDOMINAL AORTOGRAM W/LOWER EXTREMITY;  Surgeon: Wellington Hampshire, MD;  Location: [place] [facility];  Service: Cardiovascular;  Laterality: Bilateral;  . CARDIAC CATHETERIZATION [name] 06/29/2016   Procedure: Left Heart Cath and Coronary Angiography;  Surgeon: Minna Merritts, MD;  Location: [place] [facility];  Service: Cardiovascular;  Laterality: [name];  . CARDIAC CATHETERIZATION [name] 08/29/2016   Procedure: Left Heart Cath and Cors/Grafts Angiography;  Surgeon: Wellington Hampshire, MD;  Location: [place] [facility];  Service: Cardiovascular;  Laterality: [name];  . CESAREAN SECTION    . CHOLECYSTECTOMY    . CORONARY ARTERY BYPASS GRAFT [name] 07/06/2016   Procedure: CORONARY ARTERY BYPASS GRAFTING (CABG) x four, using left internal mammary artery and right leg greater saphenous vein harvested endoscopically;  Surgeon: Ivin Poot, MD;  Location: [place];  Service: Open Heart Surgery;  Laterality: [name];  . ENDARTERECTOMY Right 07/06/2016   Procedure: ENDARTERECTOMY CAROTID;  Surgeon: Rosetta Posner, MD;  Location: [place];  Service: Vascular;  Laterality: Right;  . PERIPHERAL VASCULAR CATHETERIZATION [name] 04/18/2016   Procedure: Renal Angiography;  Surgeon: Wellington Hampshire, MD;  Location: [place] [facility];  Service: Cardiovascular;  Laterality: [name];  .  PERIPHERAL VASCULAR INTERVENTION Left 10/15/2018   Procedure: PERIPHERAL VASCULAR INTERVENTION;  Surgeon: Wellington Hampshire, MD;  Location: [place] [facility];  Service: Cardiovascular;  Laterality: Left;  Left superficial femoral  . TEE WITHOUT CARDIOVERSION [name] 07/06/2016   Procedure: TRANSESOPHAGEAL ECHOCARDIOGRAM (TEE);  Surgeon: Ivin Poot, MD;  Location: [place];  Service: Open Heart Surgery;  Laterality: [name];  . TONSILLECTOMY      There were no vitals filed for this visit.  Subjective Assessment - 10/13/19 0859     Subjective  Pt states being up since 2:30 am this morning. Also worried about her daughter who had a catheter placed. Daughter has stage 4 renal failure with only 1 kidney.  The crutch helps with the hip pain.    Pertinent History  Pt states that her L hip pain is osteoarthritis, not a torn tendon. Her diagnosis changed after her MRI. Has had L hip pain for years, gradual onset. Has not had PT yet for her hip. Had chiropractic treatment which helped a little bit. Last time she had chirpractic treatment was last week. Only goes there if she needs to go. No specific schedule. No back or hip surgeries.  Also has PAD L LE, had 2 stents but still completely blocked. The stents are in her L medial distal thigh.  States having L low back pain to L lateral hip. Started using B axillary crutches last week for long distances.    Patient Stated Goals  Walk greater than 200 ft. Be more active to be able to go to the mountains and walk    Currently in Pain?  Yes    Pain Score  7     Pain Onset  More than a month ago                               PT Education - 10/13/19 0932    Education Details  blood pressure levels not appropriate for physical therapy    Person(s) Educated  Patient    Methods  Explanation    Comprehension  Verbalized understanding      Objective   No latex band allergies  Pt states her blood pressure is not controlled. Some days her blood pressure is perfect some days it is not.    Monitor blood pressure  Per pt, aerobic activity makes her PAD worse  Blood pressure L arm sitting, mechanically take, normal cuff:194/111, HR111 208/130 L arm sitting, manually taken   Therapy held today secondary to elevated blood pressure levels not suitable for exercise. Pt was recommended to get checked out by her doctor or go to the ER due to the elevated levels. Pt states that she has an appointment with her PCP this coming Monday and another follow up  appointment with her cardiologist. Denies lightheadedness or dizziness.     PT Short Term Goals - 09/28/19 1020      PT SHORT TERM GOAL #1   Title  Patient will be independent with her HEP to decrease hip pain, improve strength, function, and ability to ambulate longer distances more comfortably.    Time  3    Period  Weeks    Status  New    Target Date  10/22/19        PT Long Term Goals - 09/28/19 1021      PT LONG TERM GOAL #1   Title  Patient will have a decrease in  L hip pain to 3/10 or less at worst to promote ability to ambulate longer distances as well as negotiate stairs more comfortably.    Baseline  7/10 L hip pain at most for the past 3 months (09/28/2019)    Time  6    Period  Weeks    Status  New    Target Date  11/12/19      PT LONG TERM GOAL #2   Title  Patient will be able to ambulate for at least 6 minutes for at least 700 ft prior to needing to sit down due to pain as a demonstration of improved ability to ambulate longer distances.    Baseline  Pt only able to ambulate 392 ft for 3 minutes prior to sitting down due to L hip pain (09/28/2019)    Time  6    Period  Weeks    Status  New    Target Date  11/12/19      PT LONG TERM GOAL #3   Title  Pt will improve B LE strength by at least 1/2 MMT grade to promote ability to perform functional tasks.    Time  6    Period  Weeks    Status  New    Target Date  11/12/19            Plan - 10/13/19 0854    Clinical Impression Statement  Therapy held today secondary to elevated blood pressure levels not suitable for exercise. Pt was recommended to get checked out by her doctor or go to the ER due to the elevated levels. Pt states that she has an appointment with her PCP this coming Monday and another follow up appointment with her cardiologist. Denies lightheadedness or dizziness.    Personal Factors and Comorbidities  Age;Comorbidity 3+;Fitness;Past/Current Experience;Time since onset of  injury/illness/exacerbation    Comorbidities  DM, HTN, PVD, clotting disorder, CAD, current smoker    Examination-Activity Limitations  Bend;Carry;Lift;Locomotion Level;Sleep;Squat;Stairs    Stability/Clinical Decision Making  Evolving/Moderate complexity    Rehab Potential  Fair    PT Frequency  2x / week    PT Duration  6 weeks    PT Treatment/Interventions  Electrical Stimulation;Iontophoresis 4mg /ml Dexamethasone;Traction;Ultrasound;Gait training;Stair training;Functional mobility training;Therapeutic activities;Therapeutic exercise;Balance training;Neuromuscular re-education;Patient/family education;Manual techniques;Dry needling;Spinal Manipulations;Joint Manipulations   manipulations if appropriate   PT Next Visit Plan  hip strengthening, femoral control, trunk strengthening, manual techniques, modalities PRN    Consulted and Agree with Plan of Care  Patient       Patient will benefit from skilled therapeutic intervention in order to improve the following deficits and impairments:  Pain, Postural dysfunction, Improper body mechanics, Difficulty walking, Decreased strength  Visit Diagnosis: Pain in left hip  Muscle weakness (generalized)  Difficulty in walking, not elsewhere classified     Problem List Patient Active Problem List   Diagnosis Date Noted  . Thrombocytosis ([place]) 09/16/2019  . Erythrocytosis 09/16/2019  . Well woman exam without gynecological exam 09/06/2019  . Hypercalcemia 09/06/2019  . Leukocytosis 09/06/2019  . Polycythemia 09/06/2019  . Chronic hip pain (Left) 08/27/2019  . Osteoarthritis of hip (Left) 08/27/2019  . Gluteal tendonitis of buttock (Left) 08/27/2019  . Radial nerve palsy (Right) 07/15/2019  . Neuropathy of right radial nerve 06/30/2019  . Abnormal bruising 06/25/2019  . Right hip pain 05/28/2019  . History of carotid endarterectomy (Right) 03/04/2019  . Pain medication agreement signed 03/04/2019  . Atypical facial pain (Right)  01/27/2019  . Chronic ear  pain (Right) 01/27/2019  . Chronic jaw pain (Right) 01/27/2019  . Geniculate Neuralgia (Right) 01/27/2019  . Vitamin D deficiency 01/19/2019  . Neurogenic pain 01/19/2019  . Chronic anticoagulation (PLAVIX) 01/19/2019  . Chronic pain syndrome 01/07/2019  . Long term current use of opiate analgesic 01/07/2019  . Long term prescription benzodiazepine use 01/07/2019  . Pharmacologic therapy 01/07/2019  . Disorder of skeletal system 01/07/2019  . Problems influencing health status 01/07/2019  . Chronic headaches (Primary Area of Pain) (Right) 01/07/2019  . Opiate use 09/24/2018  . Myalgia 06/24/2018  . Chronic ankle pain (Bilateral) 12/27/2017  . Tendinopathy of right gluteus medius 12/27/2017  . Tendinopathy of gluteus medius (Left) 12/27/2017  . Bilateral hip pain 12/26/2017  . Other chronic pain 10/17/2017  . Elevated troponin I level 05/21/2017  . Major depressive disorder, recurrent episode, moderate ([place]) 11/19/2016  . Insomnia 10/30/2016  . Constipation 07/25/2016  . S/P CABG x 4 07/06/2016  . Bradycardia   . CAD (coronary artery disease)   . Carotid stenosis   . CAD in native artery 06/29/2016  . Elevated troponin 06/28/2016  . Essential hypertension, malignant 06/28/2016  . Tobacco abuse 06/28/2016  . Essential hypertension   . Malignant hypertension   . Diabetes mellitus due to underlying condition, uncontrolled, with circulatory complication, with long-term current use of insulin ([place])   . Chest pain with high risk for cardiac etiology 06/27/2016  . NSTEMI (non-ST elevated myocardial infarction) ([place]) 06/27/2016  . Proteinuria 03/15/2016  . Renal artery stenosis ([place]) 03/15/2016  . MDD (major depressive disorder) 10/17/2015  . Agoraphobia with panic attacks 04/25/2015  . HTN (hypertension), malignant 10/20/2013  . Cluster headache 03/20/2012  . HLD (hyperlipidemia) 07/26/2010  . ADJUSTMENT DISORDER WITH MIXED FEATURES 07/26/2010   Joneen Boers PT, DPT   10/13/2019, 9:34 AM  [facility] [address] [place], [address] Phone: [phone]   Fax:  [phone]  Name: FARHA MESSMAN MRN: [id number] Date of Birth: 08-20-1971

## 2019-10-15 ENCOUNTER — Ambulatory Visit: Payer: BC Managed Care – PPO | Admitting: Family Medicine

## 2019-10-15 ENCOUNTER — Other Ambulatory Visit: Payer: Self-pay

## 2019-10-15 ENCOUNTER — Ambulatory Visit: Payer: BC Managed Care – PPO

## 2019-10-15 DIAGNOSIS — M6281 Muscle weakness (generalized): Secondary | ICD-10-CM | POA: Diagnosis not present

## 2019-10-15 DIAGNOSIS — M25552 Pain in left hip: Secondary | ICD-10-CM | POA: Diagnosis not present

## 2019-10-15 DIAGNOSIS — R262 Difficulty in walking, not elsewhere classified: Secondary | ICD-10-CM

## 2019-10-15 NOTE — Therapy (Signed)
[facility] [address], [place], [address] Phone: [phone]   Fax:  [phone]  Physical Therapy Treatment  Patient Details  Name: Erika [name] MRN: [id number] Date of Birth: 1971/01/22 Referring Provider (PT): Gwenlyn Perking, MD   Encounter Date: 10/15/2019  PT End of Session - 10/15/19 0850    Visit Number  5    Number of Visits  13    Date for PT Re-Evaluation  11/12/19    PT Start Time  0850    PT Stop Time  0930    PT Time Calculation (min)  40 min    Activity Tolerance  Patient tolerated treatment well    Behavior During Therapy  [facility] for tasks assessed/performed       Past Medical History:  Diagnosis Date  . Arthralgia of temporomandibular joint   . CAD, multiple vessel    a. cath 06/29/16: ostLM 40%, ostLAD 40%, pLAD 95%, ost-pLCx 60%, pLCx 95%, mLCx 60%, mRCA 95%, D2 50%, LVSF nl;  b. 07/2016 CABG x 4 (LIMA->LAD, VG->Diag, VG->OM, VG->RCA); c. 08/2016 Cath: 3VD w/ 4/4 patent grafts. LAD distal to LIMA has diff dzs->Med rx.  . Carotid arterial disease ([place])    a. 07/2016 s/p R CEA.  . Clotting disorder ([place])   . Depression   . Diastolic dysfunction    a. echo 06/28/16: EF 50-55%, mild inf wall HK, GR1DD, mild MR, RV sys fxn nl, mildly dilated LA, PASP nl  . Fatty liver disease, nonalcoholic [id number]  . HLD (hyperlipidemia)   . Labile hypertension    a. prior renal ngiogram negative for RAS in 03/2016; b. catecholamines and metanephrines normal, mildly elevated renin with normal aldosterone and normal ratio in 02/2016  . Obesity   . PTSD (post-traumatic stress disorder)   . Tobacco abuse    a. 2018 - cut back from 2 ppd to 0.5 ppd.  . Type 2 diabetes mellitus [facility]) January 2017    Past Surgical History:  Procedure Laterality Date  . ABDOMINAL AORTOGRAM W/LOWER EXTREMITY [name] 10/15/2018   Procedure: ABDOMINAL AORTOGRAM W/LOWER EXTREMITY;  Surgeon: Wellington Hampshire, MD;  Location: [facility];  Service: Cardiovascular;  Laterality: [name];  . ABDOMINAL AORTOGRAM W/LOWER EXTREMITY Bilateral 08/19/2019   Procedure: ABDOMINAL AORTOGRAM W/LOWER EXTREMITY;  Surgeon: Wellington Hampshire, MD;  Location: [facility];  Service: Cardiovascular;  Laterality: Bilateral;  . CARDIAC CATHETERIZATION [name] 06/29/2016   Procedure: Left Heart Cath and Coronary Angiography;  Surgeon: Minna Merritts, MD;  Location: [facility];  Service: Cardiovascular;  Laterality: [name];  . CARDIAC CATHETERIZATION [name] 08/29/2016   Procedure: Left Heart Cath and Cors/Grafts Angiography;  Surgeon: Wellington Hampshire, MD;  Location: [facility];  Service: Cardiovascular;  Laterality: [name];  . CESAREAN SECTION    . CHOLECYSTECTOMY    . CORONARY ARTERY BYPASS GRAFT [name] 07/06/2016   Procedure: CORONARY ARTERY BYPASS GRAFTING (CABG) x four, using left internal mammary artery and right leg greater saphenous vein harvested endoscopically;  Surgeon: Ivin Poot, MD;  Location: [facility];  Service: Open Heart Surgery;  Laterality: [name];  . ENDARTERECTOMY Right 07/06/2016   Procedure: ENDARTERECTOMY CAROTID;  Surgeon: Rosetta Posner, MD;  Location: [facility];  Service: Vascular;  Laterality: Right;  . PERIPHERAL VASCULAR CATHETERIZATION [name] 04/18/2016   Procedure: Renal Angiography;  Surgeon: Wellington Hampshire, MD;  Location: [facility];  Service: Cardiovascular;  Laterality: [name];  .  PERIPHERAL VASCULAR INTERVENTION Left 10/15/2018   Procedure: PERIPHERAL VASCULAR INTERVENTION;  Surgeon: Wellington Hampshire, MD;  Location: [facility];  Service: Cardiovascular;  Laterality: Left;  Left superficial femoral  . TEE WITHOUT CARDIOVERSION [name] 07/06/2016   Procedure: TRANSESOPHAGEAL ECHOCARDIOGRAM (TEE);  Surgeon: Ivin Poot, MD;  Location: [facility];  Service: Open Heart Surgery;  Laterality: [name];  . TONSILLECTOMY      There were no vitals filed for this visit.  Subjective Assessment - 10/15/19 0854     Subjective  Pt states that she has to be out by 9:45 am next Monday's appointment. L hip is doing ok today. Not really having pain. Bothered her Tuesday night but pt was doing a lot of moving around than normal due to a catering event.    Pertinent History  Pt states that her L hip pain is osteoarthritis, not a torn tendon. Her diagnosis changed after her MRI. Has had L hip pain for years, gradual onset. Has not had PT yet for her hip. Had chiropractic treatment which helped a little bit. Last time she had chirpractic treatment was last week. Only goes there if she needs to go. No specific schedule. No back or hip surgeries.  Also has PAD L LE, had 2 stents but still completely blocked. The stents are in her L medial distal thigh.  States having L low back pain to L lateral hip. Started using B axillary crutches last week for long distances.    Patient Stated Goals  Walk greater than 200 ft. Be more active to be able to go [place] the [place] and walk    Currently in Pain?  No/denies    Pain Onset  More than a month ago                               PT Education - 10/15/19 0857    Education Details  ther-ex    Person(s) Educated  Patient    Methods  Explanation;Demonstration;Tactile cues;Verbal cues    Comprehension  Returned demonstration;Verbalized understanding        Objective   No latex band allergies  Pt states her blood pressure is not controlled. Some days her blood pressure is perfect some days it is not.    Monitor blood pressure  Per pt, aerobic activity makes her PAD worse  Blood pressure L arm sitting, mechanically take, normal cuff:143/77, HR80      Has to be out by 9:45 am Monday 10/19/2019 to get to [facility] MD appointment    Therapeutic exercise   Seated L hip extension isometrics at 40% effort for 1 minute with 1 minute rest breaks x 5   Sit <> stand with emphasis on femoral control 10x2 with yellow band around distal  thighs to decrease stress to throcanteric bursa   SLS on L LE with emphasis on level pelvis R foot assist on 4 inch step with B UE assist 10x5 seconds for 2 sets  Standing L hip extension with B UE assist 10x3 to promote glute max strengthening  Seated L hip ER 10x3 to promote glute strengthening    Seated hip adduction ball and glute max squeeze 10x5 seconds for 3 sets    Try side stepping with feet not  past shoulder width apart in adduction next visit if appropriate    Rest breaks secondary to discomfort from PAD   Improved exercise technique, movement at target joints,  use of target muscles after min to mod verbal, visual, tactile cues.       Response to treatment Pt tolerated session well without aggravation of symptoms.Good muscle use felt with exercises   Clinical impression Improving femoral control observed. Blood pressure within levels appropriate for PT. Worked on isometric and concentric L glute muscle activation to promote proper stress to tendon to promote healing as well as to help promote femoral control and pelvic control to decrease stress overall. Pt will benefit from continued skilled physical therapy services to decrease pain, improve strength, pelvic and femoral control, and function.      PT Short Term Goals - 09/28/19 1020      PT SHORT TERM GOAL #1   Title  Patient will be independent with her HEP to decrease hip pain, improve strength, function, and ability to ambulate longer distances more comfortably.    Time  3    Period  Weeks    Status  New    Target Date  10/22/19        PT Long Term Goals - 09/28/19 1021      PT LONG TERM GOAL #1   Title  Patient will have a decrease in L hip pain to 3/10 or less at worst to promote ability to ambulate longer distances as well as negotiate stairs more comfortably.    Baseline  7/10 L hip pain at most for the past 3 months (09/28/2019)    Time  6    Period  Weeks    Status  New     Target Date  11/12/19      PT LONG TERM GOAL #2   Title  Patient will be able to ambulate for at least 6 minutes for at least 700 ft prior to needing to sit down due to pain as a demonstration of improved ability to ambulate longer distances.    Baseline  Pt only able to ambulate 392 ft for 3 minutes prior to sitting down due to L hip pain (09/28/2019)    Time  6    Period  Weeks    Status  New    Target Date  11/12/19      PT LONG TERM GOAL #3   Title  Pt will improve B LE strength by at least 1/2 MMT grade to promote ability to perform functional tasks.    Time  6    Period  Weeks    Status  New    Target Date  11/12/19            Plan - 10/15/19 0858    Clinical Impression Statement  Improving femoral control observed. Blood pressure within levels appropriate for PT. Worked on isometric and concentric L glute muscle activation to promote proper stress to tendon to promote healing as well as to help promote femoral control and pelvic control to decrease stress overall. Pt will benefit from continued skilled physical therapy services to decrease pain, improve strength, pelvic and femoral control, and function.    Personal Factors and Comorbidities  Age;Comorbidity 3+;Fitness;Past/Current Experience;Time since onset of injury/illness/exacerbation    Comorbidities  DM, HTN, PVD, clotting disorder, CAD, current smoker    Examination-Activity Limitations  Bend;Carry;Lift;Locomotion Level;Sleep;Squat;Stairs    Stability/Clinical Decision Making  Evolving/Moderate complexity    Rehab Potential  Fair    PT Frequency  2x / week    PT Duration  6 weeks    PT Treatment/Interventions  Electrical Stimulation;Iontophoresis 4mg /ml Dexamethasone;Traction;Ultrasound;Gait training;Stair training;Functional mobility  training;Therapeutic activities;Therapeutic exercise;Balance training;Neuromuscular re-education;Patient/family education;Manual techniques;Dry needling;Spinal Manipulations;Joint  Manipulations   manipulations if appropriate   PT Next Visit Plan  hip strengthening, femoral control, trunk strengthening, manual techniques, modalities PRN    Consulted and Agree with Plan of Care  Patient       Patient will benefit from skilled therapeutic intervention in order to improve the following deficits and impairments:  Pain, Postural dysfunction, Improper body mechanics, Difficulty walking, Decreased strength  Visit Diagnosis: Pain in left hip  Muscle weakness (generalized)  Difficulty in walking, not elsewhere classified     Problem List Patient Active Problem List   Diagnosis Date Noted  . Thrombocytosis ([place]) 09/16/2019  . Erythrocytosis 09/16/2019  . Well woman exam without gynecological exam 09/06/2019  . Hypercalcemia 09/06/2019  . Leukocytosis 09/06/2019  . Polycythemia 09/06/2019  . Chronic hip pain (Left) 08/27/2019  . Osteoarthritis of hip (Left) 08/27/2019  . Gluteal tendonitis of buttock (Left) 08/27/2019  . Radial nerve palsy (Right) 07/15/2019  . Neuropathy of right radial nerve 06/30/2019  . Abnormal bruising 06/25/2019  . Right hip pain 05/28/2019  . History of carotid endarterectomy (Right) 03/04/2019  . Pain medication agreement signed 03/04/2019  . Atypical facial pain (Right) 01/27/2019  . Chronic ear pain (Right) 01/27/2019  . Chronic jaw pain (Right) 01/27/2019  . Geniculate Neuralgia (Right) 01/27/2019  . Vitamin D deficiency 01/19/2019  . Neurogenic pain 01/19/2019  . Chronic anticoagulation (PLAVIX) 01/19/2019  . Chronic pain syndrome 01/07/2019  . Long term current use of opiate analgesic 01/07/2019  . Long term prescription benzodiazepine use 01/07/2019  . Pharmacologic therapy 01/07/2019  . Disorder of skeletal system 01/07/2019  . Problems influencing health status 01/07/2019  . Chronic headaches (Primary Area of Pain) (Right) 01/07/2019  . Opiate use 09/24/2018  . Myalgia 06/24/2018  . Chronic ankle pain (Bilateral)  12/27/2017  . Tendinopathy of right gluteus medius 12/27/2017  . Tendinopathy of gluteus medius (Left) 12/27/2017  . Bilateral hip pain 12/26/2017  . Other chronic pain 10/17/2017  . Elevated troponin I level 05/21/2017  . Major depressive disorder, recurrent episode, moderate ([place]) 11/19/2016  . Insomnia 10/30/2016  . Constipation 07/25/2016  . S/P CABG x 4 07/06/2016  . Bradycardia   . CAD (coronary artery disease)   . Carotid stenosis   . CAD in native artery 06/29/2016  . Elevated troponin 06/28/2016  . Essential hypertension, malignant 06/28/2016  . Tobacco abuse 06/28/2016  . Essential hypertension   . Malignant hypertension   . Diabetes mellitus due to underlying condition, uncontrolled, with circulatory complication, with long-term current use of insulin ([place])   . Chest pain with high risk for cardiac etiology 06/27/2016  . NSTEMI (non-ST elevated myocardial infarction) ([place]) 06/27/2016  . Proteinuria 03/15/2016  . Renal artery stenosis ([place]) 03/15/2016  . MDD (major depressive disorder) 10/17/2015  . Agoraphobia with panic attacks 04/25/2015  . HTN (hypertension), malignant 10/20/2013  . Cluster headache 03/20/2012  . HLD (hyperlipidemia) 07/26/2010  . ADJUSTMENT DISORDER WITH MIXED FEATURES 07/26/2010    Joneen Boers PT, DPT   10/15/2019, 10:59 AM  [facility] [address], [place], [address] Phone: [phone]   Fax:  [phone]  Name: Erika [name] MRN: [id number] Date of Birth: [birth year]

## 2019-10-16 ENCOUNTER — Telehealth: Payer: Self-pay

## 2019-10-16 NOTE — Telephone Encounter (Signed)
Travel or Contacts: PT INFORMED ME THAT SHE HAD A TEMP OF 101 LAST NIGHT.  PT SAID IT HAS GONE DOWN SINCE. I INFORMED PT THAT SHE WOULD HAVE POSSIBLY HAVE TO  DO A VIRTUAL VISIT Monday.  Any travel in the past 2 weeks? NO  Have you came in contact with anyone who has Covid? NO  Have you had a positive Covid test? NO If so, when?  Fever >100.51F [x]   Yes []   No []   Unknown  Chills []   Yes [x]   No []   Unknown  Muscle aches (myalgia) []   Yes [x]   No []   Unknown  Runny nose (rhinorrhea) []   Yes [x]   No []   Unknown  Sore throat []   Yes [x]   No []   Unknown Cough (new onset or worsening of chronic cough) []   Yes [x]   No []   Unknown  Shortness of breath (dyspnea) []   Yes [x]   No []   Unknown Nausea or vomiting []   Yes [x]   No []   Unknown  Headache []   Yes [x]   No []   Unknown  Abdominal pain  []   Yes [x]   No []   Unknown  Diarrhea (?3 loose/looser than normal stools/24hr period) []   Yes [x]   No []   Unknown Other, s:pecify

## 2019-10-19 ENCOUNTER — Other Ambulatory Visit: Payer: Self-pay | Admitting: Family Medicine

## 2019-10-19 ENCOUNTER — Other Ambulatory Visit (INDEPENDENT_AMBULATORY_CARE_PROVIDER_SITE_OTHER): Payer: BC Managed Care – PPO

## 2019-10-19 ENCOUNTER — Ambulatory Visit: Payer: BC Managed Care – PPO

## 2019-10-19 ENCOUNTER — Other Ambulatory Visit: Payer: Self-pay

## 2019-10-19 DIAGNOSIS — R300 Dysuria: Secondary | ICD-10-CM

## 2019-10-19 DIAGNOSIS — R509 Fever, unspecified: Secondary | ICD-10-CM

## 2019-10-19 LAB — URINALYSIS, ROUTINE W REFLEX MICROSCOPIC
Nitrite: POSITIVE — AB
Specific Gravity, Urine: 1.02 (ref 1.000–1.030)
Total Protein, Urine: 100 — AB
Urine Glucose: >=1000 — AB
Urobilinogen, UA: 1 (ref 0.0–1.0)
pH: 6 (ref 5.0–8.0)

## 2019-10-19 LAB — POCT URINALYSIS DIPSTICK
Bilirubin, UA: NEGATIVE
Glucose, UA: POSITIVE — AB
Ketones, UA: POSITIVE
Nitrite, UA: POSITIVE
Protein, UA: POSITIVE — AB
Spec Grav, UA: 1.015 (ref 1.010–1.025)
Urobilinogen, UA: 1 E.U./dL
pH, UA: 6 (ref 5.0–8.0)

## 2019-10-19 MED ORDER — CEPHALEXIN 500 MG PO CAPS: 500.0000 mg | ORAL_CAPSULE | Freq: Two times a day (BID) | ORAL | 0 refills | Status: DC

## 2019-10-19 NOTE — Progress Notes (Signed)
POCT U/A sent to provider/thx dmf

## 2019-10-19 NOTE — Progress Notes (Signed)
Urine Cx & U/A with reflex to micro per TA ordered/thx dmf

## 2019-10-21 ENCOUNTER — Encounter: Payer: Self-pay | Admitting: Family Medicine

## 2019-10-21 ENCOUNTER — Ambulatory Visit: Payer: BC Managed Care – PPO

## 2019-10-21 ENCOUNTER — Other Ambulatory Visit: Payer: Self-pay | Admitting: Family Medicine

## 2019-10-21 LAB — URINE CULTURE
MICRO NUMBER:: 1029465
SPECIMEN QUALITY:: ADEQUATE

## 2019-10-22 NOTE — Telephone Encounter (Signed)
I called pt and she informed me that she had a fever yesterday at 102 and took Tylenol which broke her fever.  Pt explained that she is still having back pain and was concerned if she has a kidney infection, which per lab results pt was informed that she had a UTI.   I explained to pt that she could see another provider due to her back pain and any other concerns that she may have as Dr. Deborra Medina is out of office today and the rest of this week.  Pt refused and explained that she would rather wait to see Dr. Deborra Medina and that she did not trust any one else. Pt is w/o fever today and on day 4 of Keflex RX.  I informed pt that if have conditions worsen before her appointment to see Dr. Deborra Medina, to head to ER or Urgent care or to call this office.  Pt did not have any other symptoms of SOB, cough, runny nose, sore throat, abd pain or diarrhea.  Pt is scheduled to have a VV w/Dr. Deborra Medina on Tuesday.

## 2019-10-26 ENCOUNTER — Encounter: Payer: Self-pay | Admitting: Family Medicine

## 2019-10-26 DIAGNOSIS — B9620 Unspecified Escherichia coli [E. coli] as the cause of diseases classified elsewhere: Secondary | ICD-10-CM | POA: Insufficient documentation

## 2019-10-26 MED ORDER — CIPROFLOXACIN HCL 500 MG PO TABS: 500.0000 mg | ORAL_TABLET | Freq: Two times a day (BID) | ORAL | 0 refills | Status: AC

## 2019-10-26 NOTE — Progress Notes (Signed)
Virtual Visit via Video   Due to the COVID-19 pandemic, this visit was completed with telemedicine (audio/video) technology to reduce patient and provider exposure as well as to preserve personal protective equipment.   I connected with Erika [name] by a video enabled telemedicine application and verified that I am speaking with the correct person using two identifiers. Location patient: Home Location provider: [facility], Office Persons participating in the virtual visit: [name], [name], MD   I discussed the limitations of evaluation and management by telemedicine and the availability of in person appointments. The patient expressed understanding and agreed to proceed.  Care Team   Patient Care Team: Lucille Passy, MD as PCP - General Wellington Hampshire, MD as PCP - Cardiology (Cardiology)  Subjective:   HPI:   Pyelonephritis- On 10/19/19, she left a urine sample for usdue to "classic UTI symptoms."  UA was grossly positive and she was developing a fever and back at that point.  I therefore sent urine for cx and started her on Keflex 500 mg twice daily for 10 days to cover pylenophritis.  Urine cx great e coli which should have been sensitive to Keflex.   Patient had the following message for [name] on 10/21/19:  called pt and she informed me that she had a fever yesterday at 102 and took Tylenol which broke her fever.  Pt explained that she is still having back pain and was concerned if she has a kidney infection, which per lab results pt was informed that she had a UTI.   I explained to pt that she could see another provider due to her back pain and any other concerns that she may have as Dr. Deborra Medina is out of office today and the rest of this week.  Pt refused and explained that she would rather wait to see Dr. Deborra Medina and that she did not trust any one else. Pt is w/o fever today and on day 4 of Keflex RX.  I informed pt that if have conditions worsen before her  appointment to see Dr. Deborra Medina, to head to ER or Urgent care or to call this office.  Pt did not have any other symptoms of SOB, cough, runny nose, sore throat, abd pain or diarrhea.  Pt is scheduled to have a VV w/Dr. Deborra Medina on Tuesday.  I therefore called her myself yesterday 10/26/19 to inquire how she was feeling.  Fever (has had a fever since 10/28). And left back  back pain much better (but completely gone now).  She did did have some CVA tenderness with palpations.  She was still feeling  tired with still some in  Complete bladder empty.  She had two days of Kelfex left, we agreed to cross cover with 5 days of cipro- eRx sent in yesterday and advised to also finish Keflex. Today after 2 doses of Cipro (about to take the 3rd) she feels 70% better than she did on 10/21/19.   Review of Systems  Constitutional: Negative for chills, diaphoresis, fever, malaise/fatigue and weight loss.  HENT: Negative.   Eyes: Negative.   Respiratory: Negative.   Cardiovascular: Negative.   Gastrointestinal: Positive for nausea. Negative for abdominal pain, blood in stool, constipation, diarrhea and vomiting.  Genitourinary: Positive for frequency.  Musculoskeletal: Negative.  Negative for back pain, falls, joint pain, myalgias and neck pain.  Skin: Negative.   All other systems reviewed and are negative.    Patient Active Problem List   Diagnosis  Date Noted  . Pyelonephritis due to Escherichia coli 10/26/2019  . Thrombocytosis ([place]) 09/16/2019  . Erythrocytosis 09/16/2019  . Well woman exam without gynecological exam 09/06/2019  . Hypercalcemia 09/06/2019  . Leukocytosis 09/06/2019  . Polycythemia 09/06/2019  . Chronic hip pain (Left) 08/27/2019  . Osteoarthritis of hip (Left) 08/27/2019  . Gluteal tendonitis of buttock (Left) 08/27/2019  . Radial nerve palsy (Right) 07/15/2019  . Neuropathy of right radial nerve 06/30/2019  . Abnormal bruising 06/25/2019  . Right hip pain 05/28/2019  . History of  carotid endarterectomy (Right) 03/04/2019  . Pain medication agreement signed 03/04/2019  . Atypical facial pain (Right) 01/27/2019  . Chronic ear pain (Right) 01/27/2019  . Chronic jaw pain (Right) 01/27/2019  . Geniculate Neuralgia (Right) 01/27/2019  . Vitamin D deficiency 01/19/2019  . Neurogenic pain 01/19/2019  . Chronic anticoagulation (PLAVIX) 01/19/2019  . Chronic pain syndrome 01/07/2019  . Long term current use of opiate analgesic 01/07/2019  . Long term prescription benzodiazepine use 01/07/2019  . Pharmacologic therapy 01/07/2019  . Disorder of skeletal system 01/07/2019  . Problems influencing health status 01/07/2019  . Chronic headaches (Primary Area of Pain) (Right) 01/07/2019  . Opiate use 09/24/2018  . Myalgia 06/24/2018  . Chronic ankle pain (Bilateral) 12/27/2017  . Tendinopathy of right gluteus medius 12/27/2017  . Tendinopathy of gluteus medius (Left) 12/27/2017  . Bilateral hip pain 12/26/2017  . Other chronic pain 10/17/2017  . Elevated troponin I level 05/21/2017  . Major depressive disorder, recurrent episode, moderate ([place]) 11/19/2016  . Insomnia 10/30/2016  . Constipation 07/25/2016  . S/P CABG x 4 07/06/2016  . Bradycardia   . CAD (coronary artery disease)   . Carotid stenosis   . CAD in native artery 06/29/2016  . Elevated troponin 06/28/2016  . Essential hypertension, malignant 06/28/2016  . Tobacco abuse 06/28/2016  . Essential hypertension   . Malignant hypertension   . Diabetes mellitus due to underlying condition, uncontrolled, with circulatory complication, with long-term current use of insulin ([place])   . Chest pain with high risk for cardiac etiology 06/27/2016  . NSTEMI (non-ST elevated myocardial infarction) ([place]) 06/27/2016  . Proteinuria 03/15/2016  . Renal artery stenosis ([place]) 03/15/2016  . MDD (major depressive disorder) 10/17/2015  . Agoraphobia with panic attacks 04/25/2015  . HTN (hypertension), malignant 10/20/2013  .  Cluster headache 03/20/2012  . HLD (hyperlipidemia) 07/26/2010  . ADJUSTMENT DISORDER WITH MIXED FEATURES 07/26/2010    Social History   Tobacco Use  . Smoking status: Current Every Day Smoker    Packs/day: 1.00    Years: 27.00    Pack years: 27.00    Types: Cigarettes  . Smokeless tobacco: Never Used  . Tobacco comment: patient smokes when she drives.   Substance Use Topics  . Alcohol use: Yes    Alcohol/week: 0.0 standard drinks    Comment: socially    Current Outpatient Medications:  .  albuterol (PROVENTIL HFA;VENTOLIN HFA) 108 (90 Base) MCG/ACT inhaler, Inhale 2 puffs into the lungs every 6 (six) hours as needed for wheezing., Disp: 1 Inhaler, Rfl: 0 .  Alirocumab (PRALUENT) 150 MG/ML SOAJ, Inject 150 mg into the skin every 14 (fourteen) days. (Patient taking differently: Inject 150 mg into the skin every 14 (fourteen) days. Last dose 3 week ago), Disp: 2 pen, Rfl: 11 .  amLODipine (NORVASC) 10 MG tablet, Take 1 tablet (10 mg total) by mouth daily., Disp: 90 tablet, Rfl: 3 .  aspirin EC 81 MG tablet, Take 1 tablet (81  mg total) by mouth daily., Disp: 90 tablet, Rfl: 3 .  benztropine (COGENTIN) 0.5 MG tablet, Take 1 tablet (0.5 mg total) by mouth daily., Disp: 90 tablet, Rfl: 0 .  carvedilol (COREG) 25 MG tablet, TAKE 1 TABLET (25 MG TOTAL) BY MOUTH 2 (TWO) TIMES DAILY WITH A MEAL., Disp: 60 tablet, Rfl: 5 .  cephALEXin (KEFLEX) 500 MG capsule, Take 1 capsule (500 mg total) by mouth 2 (two) times daily., Disp: 14 capsule, Rfl: 0 .  chlorproMAZINE (THORAZINE) 50 MG tablet, Take 1 tablet (50 mg total) by mouth at bedtime., Disp: 90 tablet, Rfl: 0 .  Cholecalciferol (VITAMIN D3) 125 MCG (5000 UT) CAPS, Take 1 capsule (5,000 Units total) by mouth daily with breakfast. Take along with calcium and magnesium., Disp: 90 capsule, Rfl: 0 .  ciprofloxacin (CIPRO) 500 MG tablet, Take 1 tablet (500 mg total) by mouth 2 (two) times daily for 5 days., Disp: 10 tablet, Rfl: 0 .  clonazePAM  (KLONOPIN) 0.5 MG tablet, Take 1 tablet (0.5 mg total) by mouth 3 (three) times daily as needed for anxiety., Disp: 90 tablet, Rfl: 2 .  cloNIDine (CATAPRES) 0.1 MG tablet, Take 1 tablet (0.1 mg total) by mouth 2 (two) times daily., Disp: 60 tablet, Rfl: 5 .  clopidogrel (PLAVIX) 75 MG tablet, Take 1 tablet (75 mg total) by mouth daily., Disp: 30 tablet, Rfl: 3 .  cyclobenzaprine (FEXMID) 7.5 MG tablet, TAKE 1 TABLET BY MOUTH THREE TIMES A DAY AS NEEDED FOR MUSCLE SPASMS, Disp: 90 tablet, Rfl: 5 .  FLUoxetine HCl 60 MG TABS, Take 60 mg by mouth daily., Disp: 90 tablet, Rfl: 0 .  hydrALAZINE (APRESOLINE) 25 MG tablet, , Disp: , Rfl:  .  Insulin Glargine (LANTUS SOLOSTAR) 100 UNIT/ML Solostar Pen, Inject 50 Units into the skin every morning., Disp: 10 pen, Rfl: PRN .  insulin lispro (HUMALOG KWIKPEN) 100 UNIT/ML KwikPen, Inject 0.03 mLs (3 Units total) into the skin 3 (three) times daily with meals. Take only if blood sugar is over 250.  Also pen needles 4/day, Disp: 15 mL, Rfl: 11 .  isosorbide mononitrate (IMDUR) 30 MG 24 hr tablet, Take 1 tablet (30 mg total) by mouth daily., Disp: 90 tablet, Rfl: 1 .  lamoTRIgine (LAMICTAL) 200 MG tablet, Take 1 tablet (200 mg total) by mouth daily., Disp: 90 tablet, Rfl: 0 .  losartan (COZAAR) 100 MG tablet, Take 1 tablet (100 mg total) by mouth daily., Disp: 90 tablet, Rfl: 3 .  magic mouthwash SOLN, Take 15 mLs by mouth 3 (three) times daily as needed for mouth pain., Disp: 250 mL, Rfl: 1 .  Magnesium 500 MG CAPS, Take 1 capsule (500 mg total) by mouth 2 (two) times daily at 8 am and 10 pm., Disp: 180 capsule, Rfl: 0 .  naloxone (NARCAN) nasal spray 4 mg/0.1 mL, 4 mg (contents of 1 nasal spray) as a single dose in one nostril; may repeat every 2 to 3 minutes in alternating nostrils until medical assistance becomes available, Disp: 1 kit, Rfl: 0 .  nicotine (NICODERM CQ) 14 mg/24hr patch, Place 1 patch (14 mg total) onto the skin daily., Disp: 28 patch, Rfl: 0 .   nitroGLYCERIN (NITROSTAT) 0.4 MG SL tablet, PLACE 1 TABLET (0.4 MG TOTAL) UNDER THE TONGUE EVERY 5 (FIVE) MINUTES AS NEEDED FOR CHEST PAIN., Disp: 25 tablet, Rfl: PRN .  ondansetron (ZOFRAN) 4 MG tablet, TAKE 1 TABLET (4 MG TOTAL) BY MOUTH DAILY AS NEEDED., Disp: 20 tablet, Rfl: 5 .  oxyCODONE (OXY IR/ROXICODONE) 5 MG immediate release tablet, Take 1 tablet (5 mg total) by mouth 2 (two) times daily as needed for severe pain. Must last 30 days., Disp: 60 tablet, Rfl: 0 .  [START ON 11/29/2019] oxyCODONE (OXY IR/ROXICODONE) 5 MG immediate release tablet, Take 1 tablet (5 mg total) by mouth 2 (two) times daily as needed for severe pain. Must last 30 days., Disp: 60 tablet, Rfl: 0 .  rosuvastatin (CRESTOR) 10 MG tablet, Take 1 tablet (10 mg total) by mouth daily., Disp: 90 tablet, Rfl: 3 .  spironolactone (ALDACTONE) 25 MG tablet, Take 1 tablet (25 mg total) by mouth daily., Disp: 90 tablet, Rfl: 3 .  calcium carbonate (CALCIUM 600) 600 MG TABS tablet, Take 1 tablet (600 mg total) by mouth 2 (two) times daily with a meal. (Patient not taking: Reported on 10/26/2019), Disp: 180 tablet, Rfl: 0 .  Cholecalciferol (VITAMIN D3) 1.25 MG (50000 UT) CAPS, Take 1 capsule by mouth once a week., Disp: , Rfl:  .  Insulin Syringe-Needle U-100 29G X 1/2" 0.3 ML MISC, 1 each by Does not apply route 2 (two) times daily., Disp: 30 each, Rfl: 0  Allergies  Allergen Reactions  . Chantix [Varenicline Tartrate] Other (See Comments)    Feels "crazy"     Objective:  BP (!) 148/110   Pulse 97   Temp (!) 97 F (36.1 C)   Ht '5\' 7"'$  (1.702 m)   LMP 10/19/2019   BMI 33.11 kg/m   VITALS: Per patient if applicable, see vitals. GENERAL: Alert, appears well and in no acute distress. HEENT: Atraumatic, conjunctiva clear, no obvious abnormalities on inspection of external nose and ears. NECK: Normal movements of the head and neck. CARDIOPULMONARY: No increased WOB. Speaking in clear sentences. I:E ratio WNL.  MS: Moves all  visible extremities without noticeable abnormality. PSYCH: Pleasant and cooperative, well-groomed. Speech normal rate and rhythm. Affect is appropriate. Insight and judgement are appropriate. Attention is focused, linear, and appropriate.  NEURO: CN grossly intact. Oriented as arrived to appointment on time with no prompting. Moves both UE equally.  SKIN: No obvious lesions, wounds, erythema, or cyanosis noted on face or hands.  Depression screen [facility] 2/9 10/26/2019 02/19/2019 01/27/2019  Decreased Interest 2 0 0  Down, Depressed, Hopeless 2 0 0  PHQ - 2 Score 4 0 0  Altered sleeping 0 - -  Tired, decreased energy 0 - -  Change in appetite 0 - -  Feeling bad or failure about yourself  0 - -  Trouble concentrating 1 - -  Moving slowly or fidgety/restless 0 - -  Suicidal thoughts 0 - -  PHQ-9 Score 5 - -  Difficult doing work/chores Somewhat difficult - -  Some recent data might be hidden    Assessment and Plan:   Aiyah was seen today for urinary tract infection.  Diagnoses and all orders for this visit:  Pyelonephritis due to Escherichia coli  Other orders -     ciprofloxacin (CIPRO) 500 MG tablet; Take 1 tablet (500 mg total) by mouth 2 (two) times daily for 5 days.    Marland Kitchen COVID-19 Education: The signs and symptoms of COVID-19 were discussed with the patient and how to seek care for testing if needed. The importance of social distancing was discussed today. . Reviewed expectations re: course of current medical issues. . Discussed self-management of symptoms. . Outlined signs and symptoms indicating need for more acute intervention. . Patient verbalized understanding and all questions were answered. Marland Kitchen  Health Maintenance issues including appropriate healthy diet, exercise, and smoking avoidance were discussed with patient. . See orders for this visit as documented in the electronic medical record.  Arnette Norris, MD  Records requested if needed. Time spent: 25 minutes, of which >50%  was spent in obtaining information about her symptoms, reviewing her previous labs, evaluations, and treatments, counseling her about her condition (please see the discussed topics above), and developing a plan to further investigate it; she had a number of questions which I addressed.

## 2019-10-27 ENCOUNTER — Encounter: Payer: Self-pay | Admitting: Family Medicine

## 2019-10-27 ENCOUNTER — Ambulatory Visit (INDEPENDENT_AMBULATORY_CARE_PROVIDER_SITE_OTHER): Payer: BC Managed Care – PPO | Admitting: Family Medicine

## 2019-10-27 DIAGNOSIS — N12 Tubulo-interstitial nephritis, not specified as acute or chronic: Secondary | ICD-10-CM | POA: Diagnosis not present

## 2019-10-27 DIAGNOSIS — B9620 Unspecified Escherichia coli [E. coli] as the cause of diseases classified elsewhere: Secondary | ICD-10-CM

## 2019-10-27 NOTE — Assessment & Plan Note (Signed)
>  25 minutes spent in face to face time with patient, >50% spent in counselling or coordination of care.  She is already feeling much better.  She went to endocrinologist and had her medications changed since she had glucose in her urine.  Finish cipro and keflex as directed. Push fluids. Call or send my chart message prn if these symptoms worsen or fail to improve as anticipated. The patient indicates understanding of these issues and agrees with the plan.

## 2019-11-03 ENCOUNTER — Encounter: Payer: Self-pay | Admitting: Neurology

## 2019-11-03 ENCOUNTER — Ambulatory Visit (INDEPENDENT_AMBULATORY_CARE_PROVIDER_SITE_OTHER): Payer: BC Managed Care – PPO | Admitting: Neurology

## 2019-11-03 ENCOUNTER — Other Ambulatory Visit: Payer: Self-pay

## 2019-11-03 VITALS — BP 140/87 | HR 87 | Temp 96.8°F | Ht 67.0 in | Wt 204.0 lb

## 2019-11-03 DIAGNOSIS — G894 Chronic pain syndrome: Secondary | ICD-10-CM | POA: Diagnosis not present

## 2019-11-03 DIAGNOSIS — G43709 Chronic migraine without aura, not intractable, without status migrainosus: Secondary | ICD-10-CM | POA: Diagnosis not present

## 2019-11-03 DIAGNOSIS — G5631 Lesion of radial nerve, right upper limb: Secondary | ICD-10-CM | POA: Diagnosis not present

## 2019-11-03 DIAGNOSIS — IMO0002 Reserved for concepts with insufficient information to code with codable children: Secondary | ICD-10-CM | POA: Insufficient documentation

## 2019-11-03 DIAGNOSIS — Z9889 Other specified postprocedural states: Secondary | ICD-10-CM
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
## 2019-11-04 ENCOUNTER — Other Ambulatory Visit: Payer: Self-pay

## 2019-11-04 ENCOUNTER — Ambulatory Visit (INDEPENDENT_AMBULATORY_CARE_PROVIDER_SITE_OTHER): Payer: BC Managed Care – PPO | Admitting: Endocrinology

## 2019-11-04 ENCOUNTER — Encounter: Payer: Self-pay | Admitting: Endocrinology

## 2019-11-04 VITALS — BP 138/84 | HR 82 | Ht 67.0 in | Wt 204.8 lb

## 2019-11-04 DIAGNOSIS — Z794 Long term (current) use of insulin: Secondary | ICD-10-CM | POA: Diagnosis not present

## 2019-11-04 DIAGNOSIS — E1151 Type 2 diabetes mellitus with diabetic peripheral angiopathy without gangrene: Secondary | ICD-10-CM

## 2019-11-04 DIAGNOSIS — E1159 Type 2 diabetes mellitus with other circulatory complications: Secondary | ICD-10-CM

## 2019-11-04 DIAGNOSIS — E0859 Diabetes mellitus due to underlying condition with other circulatory complications: Secondary | ICD-10-CM

## 2019-11-04 DIAGNOSIS — E0865 Diabetes mellitus due to underlying condition with hyperglycemia: Secondary | ICD-10-CM

## 2019-11-04 DIAGNOSIS — IMO0002 Reserved for concepts with insufficient information to code with codable children: Secondary | ICD-10-CM

## 2019-11-04 MED ORDER — FREESTYLE LIBRE 14 DAY SENSOR MISC: 1.0000 | 3 refills | Status: DC

## 2019-11-04 MED ORDER — FREESTYLE LIBRE 14 DAY READER DEVI: 1.0000 | Freq: Once | 0 refills | Status: AC

## 2019-11-04 MED ORDER — LANTUS SOLOSTAR 100 UNIT/ML ~~LOC~~ SOPN: 65.0000 [IU] | PEN_INJECTOR | SUBCUTANEOUS | 99 refills | Status: DC

## 2019-11-04 NOTE — Progress Notes (Signed)
Subjective:    Patient ID: Erika [name], female    DOB: Nov 24, 1971, 48 y.o.   MRN: [id number]  HPI Pt returns for f/u of diabetes mellitus: DM type: Insulin-requiring type 2 Dx'ed: 3734 Complications: CAD and PAD Therapy: insulin since 2017 GDM: never DKA: never Severe hypoglycemia: never Pancreatitis: never Pancreatic imaging: normal on 2010 CT Other: due to noncompliance with insulin, she is not a candidate for multiple daily injections Interval history: no cbg record, but states cbg's vary from 170-350.  There is no trend throughout the day.  Pt says she takes insulin as rx'ed Past Medical History:  Diagnosis Date  . Arthralgia of temporomandibular joint   . CAD, multiple vessel    a. cath 06/29/16: ostLM 40%, ostLAD 40%, pLAD 95%, ost-pLCx 60%, pLCx 95%, mLCx 60%, mRCA 95%, D2 50%, LVSF nl;  b. 07/2016 CABG x 4 (LIMA->LAD, VG->Diag, VG->OM, VG->RCA); c. 08/2016 Cath: 3VD w/ 4/4 patent grafts. LAD distal to LIMA has diff dzs->Med rx.  . Carotid arterial disease ([place])    a. 07/2016 s/p R CEA.  . Clotting disorder ([place])   . Depression   . Diastolic dysfunction    a. echo 06/28/16: EF 50-55%, mild inf wall HK, GR1DD, mild MR, RV sys fxn nl, mildly dilated LA, PASP nl  . Fatty liver disease, nonalcoholic 2876  . HLD (hyperlipidemia)   . Labile hypertension    a. prior renal ngiogram negative for RAS in 03/2016; b. catecholamines and metanephrines normal, mildly elevated renin with normal aldosterone and normal ratio in 02/2016  . Obesity   . PTSD (post-traumatic stress disorder)   . Tobacco abuse    a. 2018 - cut back from 2 ppd to 0.5 ppd.  . Type 2 diabetes mellitus [facility]) January 2017    Past Surgical History:  Procedure Laterality Date  . ABDOMINAL AORTOGRAM W/LOWER EXTREMITY [name] 10/15/2018   Procedure: ABDOMINAL AORTOGRAM W/LOWER EXTREMITY;  Surgeon: Wellington Hampshire, MD;  Location: [place] [facility];  Service: Cardiovascular;  Laterality: [name];  . ABDOMINAL AORTOGRAM  W/LOWER EXTREMITY Bilateral 08/19/2019   Procedure: ABDOMINAL AORTOGRAM W/LOWER EXTREMITY;  Surgeon: Wellington Hampshire, MD;  Location: [place] [facility];  Service: Cardiovascular;  Laterality: Bilateral;  . CARDIAC CATHETERIZATION [name] 06/29/2016   Procedure: Left Heart Cath and Coronary Angiography;  Surgeon: Minna Merritts, MD;  Location: [place] [facility];  Service: Cardiovascular;  Laterality: [name];  . CARDIAC CATHETERIZATION [name] 08/29/2016   Procedure: Left Heart Cath and Cors/Grafts Angiography;  Surgeon: Wellington Hampshire, MD;  Location: [place] [facility];  Service: Cardiovascular;  Laterality: [name];  . CESAREAN SECTION    . CHOLECYSTECTOMY    . CORONARY ARTERY BYPASS GRAFT [name] 07/06/2016   Procedure: CORONARY ARTERY BYPASS GRAFTING (CABG) x four, using left internal mammary artery and right leg greater saphenous vein harvested endoscopically;  Surgeon: Ivin Poot, MD;  Location: [place];  Service: Open Heart Surgery;  Laterality: [name];  . ENDARTERECTOMY Right 07/06/2016   Procedure: ENDARTERECTOMY CAROTID;  Surgeon: Rosetta Posner, MD;  Location: [place];  Service: Vascular;  Laterality: Right;  . PERIPHERAL VASCULAR CATHETERIZATION [name] 04/18/2016   Procedure: Renal Angiography;  Surgeon: Wellington Hampshire, MD;  Location: [place] [facility];  Service: Cardiovascular;  Laterality: [name];  . PERIPHERAL VASCULAR INTERVENTION Left 10/15/2018   Procedure: PERIPHERAL VASCULAR INTERVENTION;  Surgeon: Wellington Hampshire, MD;  Location: [place] [facility];  Service: Cardiovascular;  Laterality: Left;  Left superficial femoral  .  TEE WITHOUT CARDIOVERSION [name] 07/06/2016   Procedure: TRANSESOPHAGEAL ECHOCARDIOGRAM (TEE);  Surgeon: Ivin Poot, MD;  Location: [place];  Service: Open Heart Surgery;  Laterality: [name];  . TONSILLECTOMY      Social History   Socioeconomic History  . Marital status: Married    Spouse name: Not on file  . Number of children: 1  . Years of education: 27  . Highest education  level: Not on file  Occupational History  . Occupation: Disability  Social Needs  . Financial resource strain: Not on file  . Food insecurity    Worry: Not on file    Inability: Not on file  . Transportation needs    Medical: Not on file    Non-medical: Not on file  Tobacco Use  . Smoking status: Current Every Day Smoker    Packs/day: 1.00    Years: 27.00    Pack years: 27.00    Types: Cigarettes  . Smokeless tobacco: Never Used  . Tobacco comment: patient smokes when she drives.   Substance and Sexual Activity  . Alcohol use: Yes    Alcohol/week: 0.0 standard drinks    Comment: socially  . Drug use: No  . Sexual activity: Yes    Partners: Male    Birth control/protection: None  Lifestyle  . Physical activity    Days per week: Not on file    Minutes per session: Not on file  . Stress: Not on file  Relationships  . Social Herbalist on phone: Not on file    Gets together: Not on file    Attends religious service: Not on file    Active member of club or organization: Not on file    Attends meetings of clubs or organizations: Not on file    Relationship status: Not on file  . Intimate partner violence    Fear of current or ex partner: Not on file    Emotionally abused: Not on file    Physically abused: Not on file    Forced sexual activity: Not on file  Other Topics Concern  . Not on file  Social History Narrative   Lives with husband   Caffeine use: Drinks 2 cups coffee per day   Right handed    Current Outpatient Medications on File Prior to Visit  Medication Sig Dispense Refill  . albuterol (PROVENTIL HFA;VENTOLIN HFA) 108 (90 Base) MCG/ACT inhaler Inhale 2 puffs into the lungs every 6 (six) hours as needed for wheezing. 1 Inhaler 0  . Alirocumab (PRALUENT) 150 MG/ML SOAJ Inject 150 mg into the skin every 14 (fourteen) days. (Patient taking differently: Inject 150 mg into the skin every 14 (fourteen) days. Last dose 3 week ago) 2 pen 11  . amLODipine  (NORVASC) 10 MG tablet Take 1 tablet (10 mg total) by mouth daily. 90 tablet 3  . aspirin EC 81 MG tablet Take 1 tablet (81 mg total) by mouth daily. 90 tablet 3  . benztropine (COGENTIN) 0.5 MG tablet Take 1 tablet (0.5 mg total) by mouth daily. 90 tablet 0  . calcium carbonate (CALCIUM 600) 600 MG TABS tablet Take 1 tablet (600 mg total) by mouth 2 (two) times daily with a meal. 180 tablet 0  . carvedilol (COREG) 25 MG tablet TAKE 1 TABLET (25 MG TOTAL) BY MOUTH 2 (TWO) TIMES DAILY WITH A MEAL. 60 tablet 5  . chlorproMAZINE (THORAZINE) 50 MG tablet Take 1 tablet (50 mg total) by mouth at bedtime.  90 tablet 0  . Cholecalciferol (VITAMIN D3) 125 MCG (5000 UT) CAPS Take 1 capsule (5,000 Units total) by mouth daily with breakfast. Take along with calcium and magnesium. 90 capsule 0  . clonazePAM (KLONOPIN) 0.5 MG tablet Take 1 tablet (0.5 mg total) by mouth 3 (three) times daily as needed for anxiety. 90 tablet 2  . cloNIDine (CATAPRES) 0.1 MG tablet Take 1 tablet (0.1 mg total) by mouth 2 (two) times daily. 60 tablet 5  . clopidogrel (PLAVIX) 75 MG tablet Take 1 tablet (75 mg total) by mouth daily. 30 tablet 3  . cyclobenzaprine (FEXMID) 7.5 MG tablet TAKE 1 TABLET BY MOUTH THREE TIMES A DAY AS NEEDED FOR MUSCLE SPASMS 90 tablet 5  . FLUoxetine HCl 60 MG TABS Take 60 mg by mouth daily. 90 tablet 0  . hydrALAZINE (APRESOLINE) 25 MG tablet     . insulin lispro (HUMALOG KWIKPEN) 100 UNIT/ML KwikPen Inject 0.03 mLs (3 Units total) into the skin 3 (three) times daily with meals. Take only if blood sugar is over 250.  Also pen needles 4/day 15 mL 11  . Insulin Syringe-Needle U-100 29G X 1/2" 0.3 ML MISC 1 each by Does not apply route 2 (two) times daily. 30 each 0  . isosorbide mononitrate (IMDUR) 30 MG 24 hr tablet Take 1 tablet (30 mg total) by mouth daily. 90 tablet 1  . lamoTRIgine (LAMICTAL) 200 MG tablet Take 1 tablet (200 mg total) by mouth daily. 90 tablet 0  . losartan (COZAAR) 100 MG tablet Take  1 tablet (100 mg total) by mouth daily. 90 tablet 3  . magic mouthwash SOLN Take 15 mLs by mouth 3 (three) times daily as needed for mouth pain. 250 mL 1  . Magnesium 500 MG CAPS Take 1 capsule (500 mg total) by mouth 2 (two) times daily at 8 am and 10 pm. 180 capsule 0  . naloxone (NARCAN) nasal spray 4 mg/0.1 mL 4 mg (contents of 1 nasal spray) as a single dose in one nostril; may repeat every 2 to 3 minutes in alternating nostrils until medical assistance becomes available 1 kit 0  . nicotine (NICODERM CQ) 14 mg/24hr patch Place 1 patch (14 mg total) onto the skin daily. 28 patch 0  . nitroGLYCERIN (NITROSTAT) 0.4 MG SL tablet PLACE 1 TABLET (0.4 MG TOTAL) UNDER THE TONGUE EVERY 5 (FIVE) MINUTES AS NEEDED FOR CHEST PAIN. 25 tablet PRN  . ondansetron (ZOFRAN) 4 MG tablet TAKE 1 TABLET (4 MG TOTAL) BY MOUTH DAILY AS NEEDED. 20 tablet 5  . [START ON 11/29/2019] oxyCODONE (OXY IR/ROXICODONE) 5 MG immediate release tablet Take 1 tablet (5 mg total) by mouth 2 (two) times daily as needed for severe pain. Must last 30 days. 60 tablet 0  . rosuvastatin (CRESTOR) 10 MG tablet Take 1 tablet (10 mg total) by mouth daily. 90 tablet 3  . spironolactone (ALDACTONE) 25 MG tablet Take 1 tablet (25 mg total) by mouth daily. 90 tablet 3  . oxyCODONE (OXY IR/ROXICODONE) 5 MG immediate release tablet Take 1 tablet (5 mg total) by mouth 2 (two) times daily as needed for severe pain. Must last 30 days. 60 tablet 0   No current facility-administered medications on file prior to visit.     Allergies  Allergen Reactions  . Chantix [Varenicline Tartrate] Other (See Comments)    Feels "crazy"     Family History  Adopted: Yes  Problem Relation Age of Onset  . Diabetes Mother   .  Diabetes Father   . Alcohol abuse Father   . Heart disease Father   . Drug abuse Father   . Stroke Sister   . Anxiety disorder Sister     BP 138/84 (BP Location: Left Arm, Patient Position: Sitting, Cuff Size: Normal)   Pulse 82    Ht _0  (1.702 m)   Wt 204 lb 12.8 oz (92.9 kg)   LMP 10/19/2019   SpO2 96%   BMI 32.08 kg/m   Review of Systems She denies hypoglycemia.     Objective:   Physical Exam VITAL SIGNS:  See vs page GENERAL: no distress Pulses: dorsalis pedis intact bilat.   MSK: no deformity of the feet CV: no leg edema Skin:  no ulcer on the feet.  normal color and temp on the feet. Neuro: sensation is intact to touch on the feet Ext: both great toenails are ingrown  Lab Results  Component Value Date   CREATININE 0.50 09/07/2019   BUN 9 09/07/2019   NA 137 09/07/2019   K 3.8 09/07/2019   CL 103 09/07/2019   CO2 26 09/07/2019      Assessment & Plan:  Insulin-requiring type 2 DM: she needs increased rx  Patient Instructions  check your blood sugar 4 times a day: before the 3 meals, and at bedtime.  also check if you have symptoms of your blood sugar being too high or too low.  please keep a record of the readings and bring it to your next appointment here (or you can bring the meter itself).  You can write it on any piece of paper.  please call us sooner if your blood sugar goes below 70, or if you have a lot of readings over 200.   Please increase Lantus to 65 units each morning, and: continue Humalog 3 units 3 times a day (just before each meal), only if blood sugar is over 250.  Please come back for a follow-up appointment in 1 month.

## 2019-11-04 NOTE — Patient Instructions (Addendum)
check your blood sugar 4 times a day: before the 3 meals, and at bedtime.  also check if you have symptoms of your blood sugar being too high or too low.  please keep a record of the readings and bring it to your next appointment here (or you can bring the meter itself).  You can write it on any piece of paper.  please call us sooner if your blood sugar goes below 70, or if you have a lot of readings over 200.   Please increase Lantus to 65 units each morning, and: continue Humalog 3 units 3 times a day (just before each meal), only if blood sugar is over 250.  Please come back for a follow-up appointment in 1 month.

## 2019-11-17 ENCOUNTER — Encounter (HOSPITAL_COMMUNITY): Payer: Self-pay | Admitting: Psychiatry

## 2019-11-17 ENCOUNTER — Ambulatory Visit (INDEPENDENT_AMBULATORY_CARE_PROVIDER_SITE_OTHER): Payer: BC Managed Care – PPO | Admitting: Psychiatry

## 2019-11-17 ENCOUNTER — Other Ambulatory Visit: Payer: Self-pay

## 2019-11-17 DIAGNOSIS — F4310 Post-traumatic stress disorder, unspecified: Secondary | ICD-10-CM | POA: Diagnosis not present

## 2019-11-17 DIAGNOSIS — F331 Major depressive disorder, recurrent, moderate: Secondary | ICD-10-CM

## 2019-11-17 DIAGNOSIS — F410 Panic disorder [episodic paroxysmal anxiety] without agoraphobia: Secondary | ICD-10-CM

## 2019-11-17 MED ORDER — CLONAZEPAM 0.5 MG PO TABS: 0.5000 mg | ORAL_TABLET | Freq: Three times a day (TID) | ORAL | 2 refills | Status: DC | PRN

## 2019-11-17 MED ORDER — FLUOXETINE HCL 60 MG PO TABS: 60.0000 mg | ORAL_TABLET | Freq: Every day | ORAL | 0 refills | Status: DC

## 2019-11-17 MED ORDER — CHLORPROMAZINE HCL 50 MG PO TABS: 75.0000 mg | ORAL_TABLET | Freq: Every day | ORAL | 0 refills | Status: DC

## 2019-11-17 MED ORDER — BENZTROPINE MESYLATE 0.5 MG PO TABS: 0.5000 mg | ORAL_TABLET | Freq: Every day | ORAL | 0 refills | Status: DC

## 2019-11-17 MED ORDER — LAMOTRIGINE 200 MG PO TABS: 200.0000 mg | ORAL_TABLET | Freq: Every day | ORAL | 0 refills | Status: DC

## 2019-11-17 NOTE — Progress Notes (Signed)
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    Ketones, UA Positive     Comment: '5mg'$ /dL   Spec Grav, UA 1.015 1.010 - 1.025   Blood, UA 3+    pH, UA 6.0 5.0 - 8.0   Protein, UA Positive (A) Negative    Comment: 2+   Urobilinogen, UA 1.0 0.2 or 1.0 E.U./dL    Nitrite, UA Positive    Leukocytes, UA Small (1+) (A) Negative   Appearance     Odor       Psychiatric Specialty Exam: Physical Exam  Review of Systems  Skin: Negative for itching and rash.  Neurological: Positive for headaches.    Last menstrual period 10/19/2019.There is no height or weight on file to calculate BMI.  General Appearance: NA  Eye Contact:  NA  Speech:  Clear and Coherent and Normal Rate  Volume:  Normal  Mood:  Euthymic  Affect:  NA  Thought Process:  Goal Directed  Orientation:  Full (Time, Place, and Person)  Thought Content:  WDL and Logical  Suicidal Thoughts:  No  Homicidal Thoughts:  No  Memory:  Immediate;   Good Recent;   Good Remote;   Good  Judgement:  Good  Insight:  Good  Psychomotor Activity:  NA  Concentration:  Concentration: Good and Attention Span: Good  Recall:  Good  Fund of Knowledge:  Good  Language:  Good  Akathisia:  No  Handed:  Right  AIMS (if indicated):     Assets:  Communication Skills Desire for Improvement Housing Resilience Social Support  ADL's:  Intact  Cognition:  WNL  Sleep:   ok but nightmares      Assessment and Plan: Posttraumatic stress disorder.  Panic attacks.  Major depressive disorder, recurrent.  I review her blood work results.  Her hemoglobin A1c is 11.2 slightly improved from the past.  She is taking Thorazine 50 mg at bedtime however like to increase the dose to help her residual nightmares and flashback.  So far she is not having any side effects including tremors or shakes.  I encourage consider restart therapy with [facility] once condition get better related to Covid.  She agreed with the plan.  We will try Thorazine 75 mg at bedtime and continue Cogentin 0.5 mg at bedtime, Lamictal 200 mg daily, Prozac 60 mg daily and Klonopin 0.5 mg 3 times a day.  Discussed medication side effects and benefits.  Recommended to call us back if she is any question of any concern.  Follow-up in 3  months.  Follow Up Instructions:    I discussed the assessment and treatment plan with the patient. The patient was provided an opportunity to ask questions and all were answered. The patient agreed with the plan and demonstrated an understanding of the instructions.   The patient was advised to call back or seek an in-person evaluation if the symptoms worsen or if the condition fails to improve as anticipated.  I provided 20 minutes of non-face-to-face time during this encounter.   Kathlee Nations, MD

## 2019-11-21 ENCOUNTER — Other Ambulatory Visit: Payer: Self-pay | Admitting: Cardiovascular Disease

## 2019-11-25 ENCOUNTER — Other Ambulatory Visit: Payer: Self-pay | Admitting: Cardiovascular Disease

## 2019-11-30 ENCOUNTER — Other Ambulatory Visit: Payer: Self-pay | Admitting: *Deleted

## 2019-11-30 MED ORDER — CLONIDINE HCL 0.1 MG PO TABS: 0.1000 mg | ORAL_TABLET | Freq: Two times a day (BID) | ORAL | 0 refills | Status: DC

## 2019-12-02 DIAGNOSIS — M9901 Segmental and somatic dysfunction of cervical region: Secondary | ICD-10-CM | POA: Diagnosis not present

## 2019-12-02 DIAGNOSIS — M9903 Segmental and somatic dysfunction of lumbar region: Secondary | ICD-10-CM | POA: Diagnosis not present

## 2019-12-02 DIAGNOSIS — M545 Low back pain: Secondary | ICD-10-CM | POA: Diagnosis not present

## 2019-12-02 DIAGNOSIS — M542 Cervicalgia: Secondary | ICD-10-CM | POA: Diagnosis not present

## 2019-12-04 ENCOUNTER — Ambulatory Visit (INDEPENDENT_AMBULATORY_CARE_PROVIDER_SITE_OTHER): Payer: BC Managed Care – PPO | Admitting: Endocrinology

## 2019-12-04 ENCOUNTER — Encounter: Payer: Self-pay | Admitting: Endocrinology

## 2019-12-04 VITALS — BP 154/100 | HR 110 | Ht 67.0 in | Wt 203.8 lb

## 2019-12-04 DIAGNOSIS — E1151 Type 2 diabetes mellitus with diabetic peripheral angiopathy without gangrene: Secondary | ICD-10-CM

## 2019-12-04 DIAGNOSIS — I10 Essential (primary) hypertension: Secondary | ICD-10-CM | POA: Diagnosis not present

## 2019-12-04 DIAGNOSIS — Z794 Long term (current) use of insulin: Secondary | ICD-10-CM

## 2019-12-04 DIAGNOSIS — E0865 Diabetes mellitus due to underlying condition with hyperglycemia: Secondary | ICD-10-CM

## 2019-12-04 DIAGNOSIS — E0859 Diabetes mellitus due to underlying condition with other circulatory complications: Secondary | ICD-10-CM

## 2019-12-04 DIAGNOSIS — E1165 Type 2 diabetes mellitus with hyperglycemia: Secondary | ICD-10-CM | POA: Diagnosis not present

## 2019-12-04 DIAGNOSIS — IMO0002 Reserved for concepts with insufficient information to code with codable children: Secondary | ICD-10-CM

## 2019-12-04 LAB — POCT GLYCOSYLATED HEMOGLOBIN (HGB A1C): Hemoglobin A1C: 11 % — AB (ref 4.0–5.6)

## 2019-12-04 NOTE — Progress Notes (Signed)
Subjective:    Patient ID: Erika [name], female    DOB: 1971/03/15, 48 y.o.   MRN: [id number]  HPI Pt returns for f/u of diabetes mellitus:  DM type: Insulin-requiring type 2 Dx'ed: 4098 Complications: CAD and PAD Therapy: insulin since 2017 GDM: never DKA: never Severe hypoglycemia: never Pancreatitis: never Pancreatic imaging: normal on 2010 CT Other: due to noncompliance with insulin, she is not a candidate for multiple daily injections.   Interval history: no cbg record, but states cbg's vary from 150-300.  There is no trend throughout the day.  Pt says she takes insulin as rx'ed.  She takes Lantus just 50 mg qam. Past Medical History:  Diagnosis Date  . Arthralgia of temporomandibular joint   . CAD, multiple vessel    a. cath 06/29/16: ostLM 40%, ostLAD 40%, pLAD 95%, ost-pLCx 60%, pLCx 95%, mLCx 60%, mRCA 95%, D2 50%, LVSF nl;  b. 07/2016 CABG x 4 (LIMA->LAD, VG->Diag, VG->OM, VG->RCA); c. 08/2016 Cath: 3VD w/ 4/4 patent grafts. LAD distal to LIMA has diff dzs->Med rx.  . Carotid arterial disease ([place])    a. 07/2016 s/p R CEA.  . Clotting disorder ([place])   . Depression   . Diastolic dysfunction    a. echo 06/28/16: EF 50-55%, mild inf wall HK, GR1DD, mild MR, RV sys fxn nl, mildly dilated LA, PASP nl  . Fatty liver disease, nonalcoholic 1191  . HLD (hyperlipidemia)   . Labile hypertension    a. prior renal ngiogram negative for RAS in 03/2016; b. catecholamines and metanephrines normal, mildly elevated renin with normal aldosterone and normal ratio in 02/2016  . Obesity   . PTSD (post-traumatic stress disorder)   . Tobacco abuse    a. 2018 - cut back from 2 ppd to 0.5 ppd.  . Type 2 diabetes mellitus [facility]) January 2017    Past Surgical History:  Procedure Laterality Date  . ABDOMINAL AORTOGRAM W/LOWER EXTREMITY [name] 10/15/2018   Procedure: ABDOMINAL AORTOGRAM W/LOWER EXTREMITY;  Surgeon: Wellington Hampshire, MD;  Location: [place] [facility];  Service: Cardiovascular;   Laterality: [name];  . ABDOMINAL AORTOGRAM W/LOWER EXTREMITY Bilateral 08/19/2019   Procedure: ABDOMINAL AORTOGRAM W/LOWER EXTREMITY;  Surgeon: Wellington Hampshire, MD;  Location: [place] [facility];  Service: Cardiovascular;  Laterality: Bilateral;  . CARDIAC CATHETERIZATION [name] 06/29/2016   Procedure: Left Heart Cath and Coronary Angiography;  Surgeon: Minna Merritts, MD;  Location: [place] [facility];  Service: Cardiovascular;  Laterality: [name];  . CARDIAC CATHETERIZATION [name] 08/29/2016   Procedure: Left Heart Cath and Cors/Grafts Angiography;  Surgeon: Wellington Hampshire, MD;  Location: [place] [facility];  Service: Cardiovascular;  Laterality: [name];  . CESAREAN SECTION    . CHOLECYSTECTOMY    . CORONARY ARTERY BYPASS GRAFT [name] 07/06/2016   Procedure: CORONARY ARTERY BYPASS GRAFTING (CABG) x four, using left internal mammary artery and right leg greater saphenous vein harvested endoscopically;  Surgeon: Ivin Poot, MD;  Location: [place];  Service: Open Heart Surgery;  Laterality: [name];  . ENDARTERECTOMY Right 07/06/2016   Procedure: ENDARTERECTOMY CAROTID;  Surgeon: Rosetta Posner, MD;  Location: [place];  Service: Vascular;  Laterality: Right;  . PERIPHERAL VASCULAR CATHETERIZATION [name] 04/18/2016   Procedure: Renal Angiography;  Surgeon: Wellington Hampshire, MD;  Location: [place] [facility];  Service: Cardiovascular;  Laterality: [name];  . PERIPHERAL VASCULAR INTERVENTION Left 10/15/2018   Procedure: PERIPHERAL VASCULAR INTERVENTION;  Surgeon: Wellington Hampshire, MD;  Location: [place] [facility];  Service: Cardiovascular;  Laterality: Left;  Left superficial femoral  . TEE WITHOUT CARDIOVERSION [name] 07/06/2016   Procedure: TRANSESOPHAGEAL ECHOCARDIOGRAM (TEE);  Surgeon: Ivin Poot, MD;  Location: [place];  Service: Open Heart Surgery;  Laterality: [name];  . TONSILLECTOMY      Social History   Socioeconomic History  . Marital status: Married    Spouse name: Not on file  . Number of children: 1  . Years  of education: 70  . Highest education level: Not on file  Occupational History  . Occupation: Disability  Tobacco Use  . Smoking status: Current Every Day Smoker    Packs/day: 1.00    Years: 27.00    Pack years: 27.00    Types: Cigarettes  . Smokeless tobacco: Never Used  . Tobacco comment: patient smokes when she drives.   Substance and Sexual Activity  . Alcohol use: Yes    Alcohol/week: 0.0 standard drinks    Comment: socially  . Drug use: No  . Sexual activity: Yes    Partners: Male    Birth control/protection: None  Other Topics Concern  . Not on file  Social History Narrative   Lives with husband   Caffeine use: Drinks 2 cups coffee per day   Right handed   Social Determinants of Health   Financial Resource Strain:   . Difficulty of Paying Living Expenses: Not on file  Food Insecurity:   . Worried About Charity fundraiser in the Last Year: Not on file  . Ran Out of Food in the Last Year: Not on file  Transportation Needs:   . Lack of Transportation (Medical): Not on file  . Lack of Transportation (Non-Medical): Not on file  Physical Activity:   . Days of Exercise per Week: Not on file  . Minutes of Exercise per Session: Not on file  Stress:   . Feeling of Stress : Not on file  Social Connections:   . Frequency of Communication with Friends and Family: Not on file  . Frequency of Social Gatherings with Friends and Family: Not on file  . Attends Religious Services: Not on file  . Active Member of Clubs or Organizations: Not on file  . Attends Archivist Meetings: Not on file  . Marital Status: Not on file  Intimate Partner Violence:   . Fear of Current or Ex-Partner: Not on file  . Emotionally Abused: Not on file  . Physically Abused: Not on file  . Sexually Abused: Not on file    Current Outpatient Medications on File Prior to Visit  Medication Sig Dispense Refill  . albuterol (PROVENTIL HFA;VENTOLIN HFA) 108 (90 Base) MCG/ACT inhaler Inhale 2  puffs into the lungs every 6 (six) hours as needed for wheezing. 1 Inhaler 0  . Alirocumab (PRALUENT) 150 MG/ML SOAJ Inject 150 mg into the skin every 14 (fourteen) days. (Patient taking differently: Inject 150 mg into the skin every 14 (fourteen) days. Last dose 3 week ago) 2 pen 11  . aspirin EC 81 MG tablet Take 1 tablet (81 mg total) by mouth daily. 90 tablet 3  . benztropine (COGENTIN) 0.5 MG tablet Take 1 tablet (0.5 mg total) by mouth daily. 90 tablet 0  . calcium carbonate (CALCIUM 600) 600 MG TABS tablet Take 1 tablet (600 mg total) by mouth 2 (two) times daily with a meal. 180 tablet 0  . carvedilol (COREG) 25 MG tablet TAKE 1 TABLET (25 MG TOTAL) BY MOUTH 2 (TWO) TIMES DAILY WITH  A MEAL. 60 tablet 5  . chlorproMAZINE (THORAZINE) 50 MG tablet Take 1.5 tablets (75 mg total) by mouth at bedtime. 135 tablet 0  . Cholecalciferol (VITAMIN D3) 125 MCG (5000 UT) CAPS Take 1 capsule (5,000 Units total) by mouth daily with breakfast. Take along with calcium and magnesium. 90 capsule 0  . clonazePAM (KLONOPIN) 0.5 MG tablet Take 1 tablet (0.5 mg total) by mouth 3 (three) times daily as needed for anxiety. 90 tablet 2  . cloNIDine (CATAPRES) 0.1 MG tablet Take 1 tablet (0.1 mg total) by mouth 2 (two) times daily. 60 tablet 0  . clopidogrel (PLAVIX) 75 MG tablet TAKE 1 TABLET BY MOUTH EVERY DAY 30 tablet 3  . cyclobenzaprine (FEXMID) 7.5 MG tablet TAKE 1 TABLET BY MOUTH THREE TIMES A DAY AS NEEDED FOR MUSCLE SPASMS 90 tablet 5  . FLUoxetine HCl 60 MG TABS Take 60 mg by mouth daily. 90 tablet 0  . hydrALAZINE (APRESOLINE) 25 MG tablet     . Insulin Glargine (LANTUS SOLOSTAR) 100 UNIT/ML Solostar Pen Inject 65 Units into the skin every morning. And pen needles 1/day (Patient taking differently: Inject 50 Units into the skin every morning. And pen needles 1/day) 10 pen PRN  . insulin lispro (HUMALOG KWIKPEN) 100 UNIT/ML KwikPen Inject 0.03 mLs (3 Units total) into the skin 3 (three) times daily with meals.  Take only if blood sugar is over 250.  Also pen needles 4/day 15 mL 11  . Insulin Syringe-Needle U-100 29G X 1/2" 0.3 ML MISC 1 each by Does not apply route 2 (two) times daily. 30 each 0  . isosorbide mononitrate (IMDUR) 30 MG 24 hr tablet Take 1 tablet (30 mg total) by mouth daily. 90 tablet 1  . lamoTRIgine (LAMICTAL) 200 MG tablet Take 1 tablet (200 mg total) by mouth daily. 90 tablet 0  . losartan (COZAAR) 100 MG tablet Take 1 tablet (100 mg total) by mouth daily. 90 tablet 3  . magic mouthwash SOLN Take 15 mLs by mouth 3 (three) times daily as needed for mouth pain. 250 mL 1  . Magnesium 500 MG CAPS Take 1 capsule (500 mg total) by mouth 2 (two) times daily at 8 am and 10 pm. 180 capsule 0  . naloxone (NARCAN) nasal spray 4 mg/0.1 mL 4 mg (contents of 1 nasal spray) as a single dose in one nostril; may repeat every 2 to 3 minutes in alternating nostrils until medical assistance becomes available 1 kit 0  . nicotine (NICODERM CQ) 14 mg/24hr patch Place 1 patch (14 mg total) onto the skin daily. 28 patch 0  . nitroGLYCERIN (NITROSTAT) 0.4 MG SL tablet PLACE 1 TABLET (0.4 MG TOTAL) UNDER THE TONGUE EVERY 5 (FIVE) MINUTES AS NEEDED FOR CHEST PAIN. 25 tablet PRN  . ondansetron (ZOFRAN) 4 MG tablet TAKE 1 TABLET (4 MG TOTAL) BY MOUTH DAILY AS NEEDED. 20 tablet 5  . oxyCODONE (OXY IR/ROXICODONE) 5 MG immediate release tablet Take 1 tablet (5 mg total) by mouth 2 (two) times daily as needed for severe pain. Must last 30 days. 60 tablet 0  . rosuvastatin (CRESTOR) 10 MG tablet Take 1 tablet (10 mg total) by mouth daily. 90 tablet 3  . spironolactone (ALDACTONE) 25 MG tablet Take 1 tablet (25 mg total) by mouth daily. 90 tablet 3  . amLODipine (NORVASC) 10 MG tablet Take 1 tablet (10 mg total) by mouth daily. 90 tablet 3  . oxyCODONE (OXY IR/ROXICODONE) 5 MG immediate release tablet Take 1  tablet (5 mg total) by mouth 2 (two) times daily as needed for severe pain. Must last 30 days. 60 tablet 0   No  current facility-administered medications on file prior to visit.    Allergies  Allergen Reactions  . Chantix [Varenicline Tartrate] Other (See Comments)    Feels "crazy"     Family History  Adopted: Yes  Problem Relation Age of Onset  . Diabetes Mother   . Diabetes Father   . Alcohol abuse Father   . Heart disease Father   . Drug abuse Father   . Stroke Sister   . Anxiety disorder Sister     BP (!) 154/100 (BP Location: Left Arm, Patient Position: Sitting, Cuff Size: Normal)   Pulse (!) 110   Ht '5\' 7"'$  (1.702 m)   Wt 203 lb 12.8 oz (92.4 kg)   SpO2 98%   BMI 31.92 kg/m    Review of Systems She denies hypoglycemia.      Objective:   Physical Exam VITAL SIGNS:  See vs page GENERAL: no distress Pulses: dorsalis pedis intact bilat.   MSK: no deformity of the feet CV: no leg edema.   Skin:  no ulcer on the feet.  normal color and temp on the feet. Neuro: sensation is intact to touch on the feet.   Ext: there is bilateral onychomycosis of the toenails.    Lab Results  Component Value Date   HGBA1C 11.0 (A) 12/04/2019   Lab Results  Component Value Date   TSH 1.693 09/15/2019       Assessment & Plan:  Insulin-requiring type 2 DM, with PAD: worse HTN: is noted today  Patient Instructions  Your blood pressure is high today.  Please see your primary care provider soon, to have it rechecked check your blood sugar 4 times a day: before the 3 meals, and at bedtime.  also check if you have symptoms of your blood sugar being too high or too low.  please keep a record of the readings and bring it to your next appointment here (or you can bring the meter itself).  You can write it on any piece of paper.  please call us sooner if your blood sugar goes below 70, or if you have a lot of readings over 200.  Please increase Lantus to 65 units each morning, and:  continue Humalog 3 units 3 times a day (just before each meal), only if blood sugar is over 250.  Blood tests are  requested for you today.  We'll let you know about the results.  Please come back for a follow-up appointment in 1 month.

## 2019-12-04 NOTE — Patient Instructions (Addendum)
Your blood pressure is high today.  Please see your primary care provider soon, to have it rechecked check your blood sugar 4 times a day: before the 3 meals, and at bedtime.  also check if you have symptoms of your blood sugar being too high or too low.  please keep a record of the readings and bring it to your next appointment here (or you can bring the meter itself).  You can write it on any piece of paper.  please call us sooner if your blood sugar goes below 70, or if you have a lot of readings over 200.  Please increase Lantus to 65 units each morning, and:  continue Humalog 3 units 3 times a day (just before each meal), only if blood sugar is over 250.  Blood tests are requested for you today.  We'll let you know about the results.  Please come back for a follow-up appointment in 1 month.

## 2019-12-08 ENCOUNTER — Other Ambulatory Visit: Payer: Self-pay | Admitting: Pain Medicine

## 2019-12-08 DIAGNOSIS — E559 Vitamin D deficiency, unspecified: Secondary | ICD-10-CM

## 2019-12-09 ENCOUNTER — Encounter: Payer: Self-pay | Admitting: Family Medicine

## 2019-12-14 LAB — METANEPHRINES, PLASMA
Metanephrine, Free: <25 pg/mL (ref <=57)
Normetanephrine, Free: 165 pg/mL — ABNORMAL HIGH (ref <=148)
Total Metanephrines-Plasma: 165 pg/mL (ref <=205)

## 2019-12-14 LAB — CATECHOLAMINES, FRACTIONATED, PLASMA
Dopamine: <10 pg/mL
Epinephrine: 60 pg/mL
Norepinephrine: 983 pg/mL
Total Catecholamines: 1043 pg/mL

## 2019-12-16 ENCOUNTER — Telehealth: Payer: Self-pay

## 2019-12-16 NOTE — Telephone Encounter (Signed)
Refill requested for hydralazine one tablet PO TID. Listed under Historical Provider in med list with no directions. Please advise if OK to refill. Thank you!

## 2019-12-16 NOTE — Telephone Encounter (Signed)
Per Dr. Tyrell Antonio note in Sept, she did not tolerate Hydralazine and it was dc'd, thus do not refill as she is no longer on it.

## 2019-12-16 NOTE — Addendum Note (Signed)
Addended by: Raelene Bott, Trebor Galdamez L on: 12/16/2019 11:55 AM   Modules accepted: Orders

## 2019-12-16 NOTE — Telephone Encounter (Signed)
Hydralazine discontinued from med list. Pharmacy informed.

## 2019-12-22 ENCOUNTER — Encounter: Payer: Self-pay | Admitting: Pain Medicine

## 2019-12-27 NOTE — Progress Notes (Signed)
Virtual Encounter - Pain Management PROVIDER NOTE: Information contained herein reflects review and annotations entered in association with encounter. Interpretation of such information and data should be left to medically-trained personnel. Information provided to patient can be located elsewhere in the medical record under "Patient Instructions". Document created using STT-dictation technology, any transcriptional errors that may result from process are unintentional.    Contact & Pharmacy Preferred: [phone] Home: [phone] (home) Mobile: [phone] (mobile) E-mail: [email or web address]  [id number] [name], [place] [address] [place] [address] Phone: [phone] Fax: [phone]   Pre-screening  Erika [name] offered "in-person" vs "virtual" encounter. She indicated preferring virtual for this encounter.   Reason COVID-19*  Social distancing based on CDC and AMA recommendations.   I contacted Erika [name] on 12/28/2019 via telephone.      I clearly identified myself as Gaspar Cola, MD. I verified that I was speaking with the correct person using two identifiers (Name: Erika [name], and date of birth: June 24, 1971).  Consent I sought verbal advanced consent from Andrez Grime for virtual visit interactions. I informed Erika [name] of possible security and privacy concerns, risks, and limitations associated with providing "not-in-person" medical evaluation and management services. I also informed Erika [name] of the availability of "in-person" appointments. Finally, I informed her that there would be a charge for the virtual visit and that she could be  personally, fully or partially, financially responsible for it. Erika [name] expressed understanding and agreed to proceed.   Historic Elements   Erika [name] is a 49 y.o. year old, female patient evaluated today after her last encounter by our  practice on 12/08/2019. Erika [name]  has a past medical history of Arthralgia of temporomandibular joint, CAD, multiple vessel, Carotid arterial disease ([place]), Clotting disorder ([place]), Depression, Diastolic dysfunction, Fatty liver disease, nonalcoholic ([id number]), HLD (hyperlipidemia), Labile hypertension, Obesity, PTSD (post-traumatic stress disorder), Tobacco abuse, and Type 2 diabetes mellitus [facility]) (January 2017). She also  has a past surgical history that includes Cholecystectomy; Tonsillectomy; Cesarean section; Cardiac catheterization ([name], 04/18/2016); Cardiac catheterization ([name], 06/29/2016); Coronary artery bypass graft ([name], 07/06/2016); TEE without cardioversion ([name], 07/06/2016); Endarterectomy (Right, 07/06/2016); Cardiac catheterization ([name], 08/29/2016); ABDOMINAL AORTOGRAM W/LOWER EXTREMITY ([name], 10/15/2018); PERIPHERAL VASCULAR INTERVENTION (Left, 10/15/2018); and ABDOMINAL AORTOGRAM W/LOWER EXTREMITY (Bilateral, 08/19/2019). Erika [name] has a current medication list which includes the following prescription(s): albuterol, alirocumab, aspirin ec, benztropine, carvedilol, chlorpromazine, clonazepam, clonidine, clopidogrel, cyclobenzaprine, fluoxetine hcl, lantus solostar, insulin lispro, insulin syringe-needle u-100, isosorbide mononitrate, lamotrigine, losartan, magic mouthwash, [START ON 12/29/2019] magnesium, naloxone, nicotine, nitroglycerin, ondansetron, [START ON 12/29/2019] oxycodone, [START ON 01/28/2020] oxycodone, [START ON 02/27/2020] oxycodone, rosuvastatin, spironolactone, amlodipine, and [START ON 12/29/2019] vitamin d3. She  reports that she has been smoking cigarettes. She has a 27.00 pack-year smoking history. She has never used smokeless tobacco. She reports current alcohol use. She reports that she does not use drugs. Erika [name] is allergic to chantix [varenicline tartrate].   HPI  Today, she is being contacted for medication management.  Review of labs today shows that the patient has  hypercalcemia and therefore we will discontinue the recommendation of using OTC calcium, until this resolves.  The patient indicates doing well with the current medication regimen. No adverse reactions or side effects reported to the medications.   Pharmacotherapy Assessment  Analgesic: Oxycodone IR 5 mg. 1 tab PO BID (10 mg/day of oxycodone) (enough to last to 09/30/2019) MME/day: 15 mg/day.   Monitoring: Pharmacotherapy: No side-effects or adverse  reactions reported. [place] PMP: PDMP reviewed during this encounter.       Compliance: No problems identified. Effectiveness: Clinically acceptable. Plan: Refer to "POC".  UDS:  Summary  Date Value Ref Range Status  01/07/2019 FINAL  Final    Comment:    ==================================================================== [facility] COMP DRUG ANALYSIS,UR ==================================================================== Test                             Result       Flag       Units Drug Present and Declared for Prescription Verification   7-aminoclonazepam              115          EXPECTED   ng/mg creat    7-aminoclonazepam is an expected metabolite of clonazepam. Source    of clonazepam is a scheduled prescription medication.   Hydrocodone                    732          EXPECTED   ng/mg creat   Hydromorphone                  158          EXPECTED   ng/mg creat   Dihydrocodeine                 212          EXPECTED   ng/mg creat   Norhydrocodone                 1339         EXPECTED   ng/mg creat    Sources of hydrocodone include scheduled prescription    medications. Hydromorphone, dihydrocodeine and norhydrocodone are    expected metabolites of hydrocodone. Hydromorphone and    dihydrocodeine are also available as scheduled prescription    medications.   Cyclobenzaprine                PRESENT      EXPECTED   Desmethylcyclobenzaprine       PRESENT      EXPECTED    Desmethylcyclobenzaprine is an expected metabolite of     cyclobenzaprine.   Chlorpromazine                 PRESENT      EXPECTED   Acetaminophen                  PRESENT      EXPECTED Drug Absent but Declared for Prescription Verification   Lamotrigine                    Not Detected UNEXPECTED   Fluoxetine                     Not Detected UNEXPECTED   Salicylate                     Not Detected UNEXPECTED    Aspirin, as indicated in the declared medication list, is not    always detected even when used as directed.   Benztropine                    Not Detected UNEXPECTED ==================================================================== Test  Result    Flag   Units      Ref Range   Creatinine              165              mg/dL      >=20 ==================================================================== Declared Medications:  The flagging and interpretation on this report are based on the  following declared medications.  Unexpected results may arise from  inaccuracies in the declared medications.  **Note: The testing scope of this panel includes these medications:  Benztropine (Cogentin)  Chlorpromazine (Thorazine)  Clonazepam (Klonopin)  Cyclobenzaprine (Fexmid)  Fluoxetine  Hydrocodone (Norco)  Lamotrigine (Lamictal)  **Note: The testing scope of this panel does not include small to  moderate amounts of these reported medications:  Acetaminophen (Norco)  Aspirin (Aspirin 81)  **Note: The testing scope of this panel does not include following  reported medications:  Albuterol  Alirocumab  Carvedilol (Coreg)  Clopidogrel (Plavix)  Insulin  Insulin (Lantus)  Isosorbide (Imdur)  Losartan (Cozaar)  Mouthwash (Magic Mouthwash)  Naloxone (Narcan)  Nitroglycerin (Nitrostat)  Ondansetron (Zofran)  Rosuvastatin (Crestor)  Spironolactone (Aldactone)  Triamcinolone (Kenalog) ==================================================================== For clinical consultation, please call [phone]. ====================================================================    Laboratory Chemistry Profile (12 mo)  Renal: 08/13/2019: BUN/Creatinine Ratio 12 09/07/2019: BUN 9; Creatinine, Ser 0.50  Lab Results  Component Value Date   GFR 132.97 09/07/2019   GFRAA 122 08/13/2019   GFRNONAA 106 08/13/2019   Hepatic: 09/07/2019: Albumin 4.1 Lab Results  Component Value Date   AST 16 09/07/2019   ALT 23 09/07/2019   Other: 01/07/2019: 25-Hydroxy, Vitamin D 16; 25-Hydroxy, Vitamin D-2 <1.0; 25-Hydroxy, Vitamin D-3 15; CRP 15; Vitamin B-12 476 03/17/2019: Sed Rate 17 09/07/2019: VITD 28.26 Note: Above Lab results reviewed.  Imaging  DG PAIN CLINIC C-ARM 1-60 MIN NO REPORT Fluoro was used, but no Radiologist interpretation will be provided.  Please refer to "NOTES" tab for provider progress note.   Assessment  The primary encounter diagnosis was Chronic headaches (Primary Area of Pain) (Right). Diagnoses of Chronic pain syndrome, Chronic ear pain (Right), Chronic jaw pain (Right), Geniculate Neuralgia (Right), History of carotid endarterectomy (Right), and Vitamin D deficiency were also pertinent to this visit.  Plan of Care  Problem-specific:  No problem-specific Assessment & Plan notes found for this encounter.  I have discontinued Erika [name] calcium carbonate. I am also having her start on oxyCODONE and oxyCODONE. Additionally, I am having her maintain her Insulin Syringe-Needle U-100, aspirin EC, rosuvastatin, magic mouthwash, naloxone, nitroGLYCERIN, albuterol, spironolactone, Alirocumab, losartan, amLODipine, carvedilol, nicotine, isosorbide mononitrate, insulin lispro, ondansetron, cyclobenzaprine, Lantus SoloStar, lamoTRIgine, FLUoxetine HCl, clonazePAM, benztropine, chlorproMAZINE, clopidogrel, cloNIDine, oxyCODONE, Vitamin D3, and Magnesium.  Pharmacotherapy (Medications Ordered): Meds ordered this encounter  Medications  . oxyCODONE (OXY IR/ROXICODONE) 5 MG  immediate release tablet    Sig: Take 1 tablet (5 mg total) by mouth 2 (two) times daily as needed for severe pain. Must last 30 days.    Dispense:  60 tablet    Refill:  0    Chronic Pain: STOP Act (Not applicable) Fill 1 day early if closed on refill date. Do not fill until: 12/29/2019. To last until: 01/28/2020. Avoid benzodiazepines within 8 hours of opioids  . oxyCODONE (OXY IR/ROXICODONE) 5 MG immediate release tablet    Sig: Take 1 tablet (5 mg total) by mouth 2 (two) times daily as needed for severe pain. Must last 30 days.    Dispense:  60 tablet  Refill:  0    Chronic Pain: STOP Act (Not applicable) Fill 1 day early if closed on refill date. Do not fill until: 01/28/2020. To last until: 02/27/2020. Avoid benzodiazepines within 8 hours of opioids  . oxyCODONE (OXY IR/ROXICODONE) 5 MG immediate release tablet    Sig: Take 1 tablet (5 mg total) by mouth 2 (two) times daily as needed for severe pain. Must last 30 days.    Dispense:  60 tablet    Refill:  0    Chronic Pain: STOP Act (Not applicable) Fill 1 day early if closed on refill date. Do not fill until: 02/27/2020. To last until: 03/28/2020. Avoid benzodiazepines within 8 hours of opioids  . Cholecalciferol (VITAMIN D3) 125 MCG (5000 UT) CAPS    Sig: Take 1 capsule (5,000 Units total) by mouth daily with breakfast. Take along with calcium and magnesium.    Dispense:  90 capsule    Refill:  1    Fill one day early if pharmacy is closed on scheduled refill date. May substitute for generic if available.  . Magnesium 500 MG CAPS    Sig: Take 1 capsule (500 mg total) by mouth 2 (two) times daily at 8 am and 10 pm.    Dispense:  180 capsule    Refill:  1    Fill one day early if pharmacy is closed on scheduled refill date. May substitute for generic if available.   Orders:  No orders of the defined types were placed in this encounter.  Follow-up plan:   Return in about 13 weeks (around 03/28/2020) for (VV), (MM).      Interventional  management options: Considering:   NOTE: PLAVIX ANTICOAGULATION(Stop:7-10 days pre-procedure; Restart:2 hours post-proc.) Diagnostic right sided facial nerve block Diagnostic right-sided geniculate nerve block  Diagnostic right-sided occipital nerve block  Possible right-sided occipital nerveRFA Diagnostic Botox injections   Palliative PRN treatment(s):   Diagnostic left peri-insertional gluteal tendon injection #2  Diagnostic Facial (CN VII) nerve block  #4  Diagnostic right facial/Geniculatenerve block #3         Recent Visits No visits were found meeting these conditions.  Showing recent visits within past 90 days and meeting all other requirements   Today's Visits Date Type Provider Dept  12/28/19 Telemedicine Milinda Pointer, MD [facility]  Showing today's visits and meeting all other requirements   Future Appointments No visits were found meeting these conditions.  Showing future appointments within next 90 days and meeting all other requirements   I discussed the assessment and treatment plan with the patient. The patient was provided an opportunity to ask questions and all were answered. The patient agreed with the plan and demonstrated an understanding of the instructions.  Patient advised to call back or seek an in-person evaluation if the symptoms or condition worsens.  Total duration of non-face-to-face encounter: 13 minutes.  Note by: Gaspar Cola, MD Date: 12/28/2019; Time: 8:43 AM

## 2019-12-28 ENCOUNTER — Other Ambulatory Visit: Payer: Self-pay | Admitting: *Deleted

## 2019-12-28 ENCOUNTER — Ambulatory Visit: Payer: BC Managed Care – PPO | Attending: Pain Medicine | Admitting: Pain Medicine

## 2019-12-28 ENCOUNTER — Other Ambulatory Visit: Payer: Self-pay

## 2019-12-28 DIAGNOSIS — G8929 Other chronic pain: Secondary | ICD-10-CM

## 2019-12-28 DIAGNOSIS — R6884 Jaw pain: Secondary | ICD-10-CM | POA: Diagnosis not present

## 2019-12-28 DIAGNOSIS — R519 Headache, unspecified: Secondary | ICD-10-CM | POA: Diagnosis not present

## 2019-12-28 DIAGNOSIS — E559 Vitamin D deficiency, unspecified: Secondary | ICD-10-CM

## 2019-12-28 DIAGNOSIS — Z9889 Other specified postprocedural states: Secondary | ICD-10-CM

## 2019-12-28 DIAGNOSIS — B0221 Postherpetic geniculate ganglionitis: Secondary | ICD-10-CM

## 2019-12-28 DIAGNOSIS — G518 Other disorders of facial nerve: Secondary | ICD-10-CM

## 2019-12-28 DIAGNOSIS — G894 Chronic pain syndrome: Secondary | ICD-10-CM | POA: Diagnosis not present

## 2019-12-28 DIAGNOSIS — H9201 Otalgia, right ear: Secondary | ICD-10-CM

## 2019-12-28 MED ORDER — MAGNESIUM 500 MG PO CAPS: 500.0000 mg | ORAL_CAPSULE | Freq: Two times a day (BID) | ORAL | 1 refills | Status: DC

## 2019-12-28 MED ORDER — LOSARTAN POTASSIUM 100 MG PO TABS: 100.0000 mg | ORAL_TABLET | Freq: Every day | ORAL | 0 refills | Status: DC

## 2019-12-28 MED ORDER — OXYCODONE HCL 5 MG PO TABS: 5.0000 mg | ORAL_TABLET | Freq: Two times a day (BID) | ORAL | 0 refills | Status: DC | PRN

## 2019-12-28 MED ORDER — VITAMIN D3 125 MCG (5000 UT) PO CAPS: 1.0000 | ORAL_CAPSULE | Freq: Every day | ORAL | 1 refills | Status: DC

## 2019-12-29 ENCOUNTER — Telehealth: Payer: Self-pay

## 2019-12-29 ENCOUNTER — Other Ambulatory Visit: Payer: Self-pay | Admitting: Pain Medicine

## 2019-12-29 DIAGNOSIS — M792 Neuralgia and neuritis, unspecified: Secondary | ICD-10-CM

## 2019-12-29 MED ORDER — REPATHA SURECLICK 140 MG/ML ~~LOC~~ SOAJ: 1.0000 "pen " | SUBCUTANEOUS | 11 refills | Status: DC

## 2019-12-29 NOTE — Telephone Encounter (Signed)
Repatha 140 mg every 2 weeks is fine.

## 2019-12-29 NOTE — Telephone Encounter (Signed)
Per fax via [facility], Praluent is no longer covered by patient's insurance plan. Cost to patient will be $532.99. Preferred product is Repatha. Please advise if OK to change drugs.

## 2019-12-29 NOTE — Telephone Encounter (Signed)
Called and spoke with patient. She is aware that she is having to switch medications. SHe is aware I will send Repatha 140 mg every 2 weeks to her pharmacy. She was appreciative. Rx sent.

## 2019-12-29 NOTE — Telephone Encounter (Signed)
Routing to Dr Fletcher Anon to advise if ok to switch to Repatha and what dosage.

## 2019-12-29 NOTE — Addendum Note (Signed)
Addended by: Vanessa Ralphs on: 12/29/2019 04:36 PM   Modules accepted: Orders

## 2019-12-30 ENCOUNTER — Other Ambulatory Visit: Payer: Self-pay

## 2019-12-30 MED ORDER — CLONIDINE HCL 0.1 MG PO TABS: 0.1000 mg | ORAL_TABLET | Freq: Two times a day (BID) | ORAL | 0 refills | Status: DC

## 2020-01-06 ENCOUNTER — Other Ambulatory Visit: Payer: Self-pay

## 2020-01-06 MED ORDER — NITROGLYCERIN 0.4 MG SL SUBL: 0.4000 mg | SUBLINGUAL_TABLET | SUBLINGUAL | 98 refills | Status: DC | PRN

## 2020-01-08 ENCOUNTER — Ambulatory Visit: Payer: Managed Care, Other (non HMO) | Admitting: Endocrinology

## 2020-01-08 ENCOUNTER — Encounter: Payer: Self-pay | Admitting: Endocrinology

## 2020-01-08 ENCOUNTER — Other Ambulatory Visit: Payer: Self-pay

## 2020-01-08 VITALS — BP 170/102 | HR 114 | Ht 67.0 in | Wt 204.6 lb

## 2020-01-08 DIAGNOSIS — E1151 Type 2 diabetes mellitus with diabetic peripheral angiopathy without gangrene: Secondary | ICD-10-CM

## 2020-01-08 DIAGNOSIS — E1165 Type 2 diabetes mellitus with hyperglycemia: Secondary | ICD-10-CM | POA: Diagnosis not present

## 2020-01-08 DIAGNOSIS — Z794 Long term (current) use of insulin: Secondary | ICD-10-CM | POA: Diagnosis not present

## 2020-01-08 DIAGNOSIS — E0859 Diabetes mellitus due to underlying condition with other circulatory complications: Secondary | ICD-10-CM

## 2020-01-08 DIAGNOSIS — E0865 Diabetes mellitus due to underlying condition with hyperglycemia: Secondary | ICD-10-CM

## 2020-01-08 DIAGNOSIS — IMO0002 Reserved for concepts with insufficient information to code with codable children: Secondary | ICD-10-CM

## 2020-01-08 MED ORDER — FARXIGA 5 MG PO TABS: 5.0000 mg | ORAL_TABLET | Freq: Every day | ORAL | 11 refills | Status: DC

## 2020-01-08 MED ORDER — LANTUS SOLOSTAR 100 UNIT/ML ~~LOC~~ SOPN: 75.0000 [IU] | PEN_INJECTOR | SUBCUTANEOUS | 99 refills | Status: DC

## 2020-01-08 NOTE — Progress Notes (Signed)
Subjective:    Patient ID: Erika [name], female    DOB: 1971/08/29, 49 y.o.   MRN: [id number]  HPI Pt returns for f/u of diabetes mellitus:  DM type: Insulin-requiring type 2 Dx'ed: 7262 Complications: CAD and PAD Therapy: insulin since 2017 GDM: never DKA: never Severe hypoglycemia: never Pancreatitis: never Pancreatic imaging: normal on 2010 CT SDOH: due to noncompliance with insulin, she is not a candidate for multiple daily injections.   Other: she is not a candidate for GLP med, due to chronic nausea.  Interval history: no cbg record, but states cbg's still vary from 150-300.  Pt says she takes insulin as rx'ed.  pt states she feels well in general.   Past Medical History:  Diagnosis Date  . Arthralgia of temporomandibular joint   . CAD, multiple vessel    a. cath 06/29/16: ostLM 40%, ostLAD 40%, pLAD 95%, ost-pLCx 60%, pLCx 95%, mLCx 60%, mRCA 95%, D2 50%, LVSF nl;  b. 07/2016 CABG x 4 (LIMA->LAD, VG->Diag, VG->OM, VG->RCA); c. 08/2016 Cath: 3VD w/ 4/4 patent grafts. LAD distal to LIMA has diff dzs->Med rx.  . Carotid arterial disease ([place])    a. 07/2016 s/p R CEA.  . Clotting disorder ([place])   . Depression   . Diastolic dysfunction    a. echo 06/28/16: EF 50-55%, mild inf wall HK, GR1DD, mild MR, RV sys fxn nl, mildly dilated LA, PASP nl  . Fatty liver disease, nonalcoholic 0355  . HLD (hyperlipidemia)   . Labile hypertension    a. prior renal ngiogram negative for RAS in 03/2016; b. catecholamines and metanephrines normal, mildly elevated renin with normal aldosterone and normal ratio in 02/2016  . Obesity   . PTSD (post-traumatic stress disorder)   . Tobacco abuse    a. 2018 - cut back from 2 ppd to 0.5 ppd.  . Type 2 diabetes mellitus [facility]) January 2017    Past Surgical History:  Procedure Laterality Date  . ABDOMINAL AORTOGRAM W/LOWER EXTREMITY [name] 10/15/2018   Procedure: ABDOMINAL AORTOGRAM W/LOWER EXTREMITY;  Surgeon: Wellington Hampshire, MD;  Location: [place] [facility];  Service: Cardiovascular;  Laterality: [name];  . ABDOMINAL AORTOGRAM W/LOWER EXTREMITY Bilateral 08/19/2019   Procedure: ABDOMINAL AORTOGRAM W/LOWER EXTREMITY;  Surgeon: Wellington Hampshire, MD;  Location: [place] [facility];  Service: Cardiovascular;  Laterality: Bilateral;  . CARDIAC CATHETERIZATION [name] 06/29/2016   Procedure: Left Heart Cath and Coronary Angiography;  Surgeon: Minna Merritts, MD;  Location: [place] [facility];  Service: Cardiovascular;  Laterality: [name];  . CARDIAC CATHETERIZATION [name] 08/29/2016   Procedure: Left Heart Cath and Cors/Grafts Angiography;  Surgeon: Wellington Hampshire, MD;  Location: [place] [facility];  Service: Cardiovascular;  Laterality: [name];  . CESAREAN SECTION    . CHOLECYSTECTOMY    . CORONARY ARTERY BYPASS GRAFT [name] 07/06/2016   Procedure: CORONARY ARTERY BYPASS GRAFTING (CABG) x four, using left internal mammary artery and right leg greater saphenous vein harvested endoscopically;  Surgeon: Ivin Poot, MD;  Location: [place];  Service: Open Heart Surgery;  Laterality: [name];  . ENDARTERECTOMY Right 07/06/2016   Procedure: ENDARTERECTOMY CAROTID;  Surgeon: Rosetta Posner, MD;  Location: [place];  Service: Vascular;  Laterality: Right;  . PERIPHERAL VASCULAR CATHETERIZATION [name] 04/18/2016   Procedure: Renal Angiography;  Surgeon: Wellington Hampshire, MD;  Location: [place] [facility];  Service: Cardiovascular;  Laterality: [name];  . PERIPHERAL VASCULAR INTERVENTION Left 10/15/2018   Procedure: PERIPHERAL VASCULAR INTERVENTION;  Surgeon: Fletcher Anon,  Mertie Clause, MD;  Location: [place] [facility];  Service: Cardiovascular;  Laterality: Left;  Left superficial femoral  . TEE WITHOUT CARDIOVERSION [name] 07/06/2016   Procedure: TRANSESOPHAGEAL ECHOCARDIOGRAM (TEE);  Surgeon: Ivin Poot, MD;  Location: [place];  Service: Open Heart Surgery;  Laterality: [name];  . TONSILLECTOMY      Social History   Socioeconomic History  . Marital status: Married    Spouse name: Not  on file  . Number of children: 1  . Years of education: 68  . Highest education level: Not on file  Occupational History  . Occupation: Disability  Tobacco Use  . Smoking status: Current Every Day Smoker    Packs/day: 1.00    Years: 27.00    Pack years: 27.00    Types: Cigarettes  . Smokeless tobacco: Never Used  . Tobacco comment: patient smokes when she drives.   Substance and Sexual Activity  . Alcohol use: Yes    Alcohol/week: 0.0 standard drinks    Comment: socially  . Drug use: No  . Sexual activity: Yes    Partners: Male    Birth control/protection: None  Other Topics Concern  . Not on file  Social History Narrative   Lives with husband   Caffeine use: Drinks 2 cups coffee per day   Right handed   Social Determinants of Health   Financial Resource Strain:   . Difficulty of Paying Living Expenses: Not on file  Food Insecurity:   . Worried About Charity fundraiser in the Last Year: Not on file  . Ran Out of Food in the Last Year: Not on file  Transportation Needs:   . Lack of Transportation (Medical): Not on file  . Lack of Transportation (Non-Medical): Not on file  Physical Activity:   . Days of Exercise per Week: Not on file  . Minutes of Exercise per Session: Not on file  Stress:   . Feeling of Stress : Not on file  Social Connections:   . Frequency of Communication with Friends and Family: Not on file  . Frequency of Social Gatherings with Friends and Family: Not on file  . Attends Religious Services: Not on file  . Active Member of Clubs or Organizations: Not on file  . Attends Archivist Meetings: Not on file  . Marital Status: Not on file  Intimate Partner Violence:   . Fear of Current or Ex-Partner: Not on file  . Emotionally Abused: Not on file  . Physically Abused: Not on file  . Sexually Abused: Not on file    Current Outpatient Medications on File Prior to Visit  Medication Sig Dispense Refill  . albuterol (PROVENTIL HFA;VENTOLIN  HFA) 108 (90 Base) MCG/ACT inhaler Inhale 2 puffs into the lungs every 6 (six) hours as needed for wheezing. 1 Inhaler 0  . aspirin EC 81 MG tablet Take 1 tablet (81 mg total) by mouth daily. 90 tablet 3  . benztropine (COGENTIN) 0.5 MG tablet Take 1 tablet (0.5 mg total) by mouth daily. 90 tablet 0  . carvedilol (COREG) 25 MG tablet TAKE 1 TABLET (25 MG TOTAL) BY MOUTH 2 (TWO) TIMES DAILY WITH A MEAL. 60 tablet 5  . chlorproMAZINE (THORAZINE) 50 MG tablet Take 1.5 tablets (75 mg total) by mouth at bedtime. 135 tablet 0  . Cholecalciferol (VITAMIN D3) 125 MCG (5000 UT) CAPS Take 1 capsule (5,000 Units total) by mouth daily with breakfast. Take along with calcium and magnesium. 90 capsule 1  .  clonazePAM (KLONOPIN) 0.5 MG tablet Take 1 tablet (0.5 mg total) by mouth 3 (three) times daily as needed for anxiety. 90 tablet 2  . cloNIDine (CATAPRES) 0.1 MG tablet Take 1 tablet (0.1 mg total) by mouth 2 (two) times daily. 60 tablet 0  . clopidogrel (PLAVIX) 75 MG tablet TAKE 1 TABLET BY MOUTH EVERY DAY 30 tablet 3  . cyclobenzaprine (FEXMID) 7.5 MG tablet TAKE 1 TABLET BY MOUTH THREE TIMES A DAY AS NEEDED FOR MUSCLE SPASMS 90 tablet 5  . Evolocumab (REPATHA SURECLICK) 620 MG/ML SOAJ Inject 1 pen into the skin every 14 (fourteen) days. 2 pen 11  . FLUoxetine HCl 60 MG TABS Take 60 mg by mouth daily. 90 tablet 0  . insulin lispro (HUMALOG KWIKPEN) 100 UNIT/ML KwikPen Inject 0.03 mLs (3 Units total) into the skin 3 (three) times daily with meals. Take only if blood sugar is over 250.  Also pen needles 4/day 15 mL 11  . Insulin Syringe-Needle U-100 29G X 1/2" 0.3 ML MISC 1 each by Does not apply route 2 (two) times daily. 30 each 0  . isosorbide mononitrate (IMDUR) 30 MG 24 hr tablet Take 1 tablet (30 mg total) by mouth daily. 90 tablet 1  . lamoTRIgine (LAMICTAL) 200 MG tablet Take 1 tablet (200 mg total) by mouth daily. 90 tablet 0  . losartan (COZAAR) 100 MG tablet Take 1 tablet (100 mg total) by mouth  daily. 30 tablet 0  . magic mouthwash SOLN Take 15 mLs by mouth 3 (three) times daily as needed for mouth pain. 250 mL 1  . Magnesium 500 MG CAPS Take 1 capsule (500 mg total) by mouth 2 (two) times daily at 8 am and 10 pm. 180 capsule 1  . naloxone (NARCAN) nasal spray 4 mg/0.1 mL 4 mg (contents of 1 nasal spray) as a single dose in one nostril; may repeat every 2 to 3 minutes in alternating nostrils until medical assistance becomes available 1 kit 0  . nicotine (NICODERM CQ) 14 mg/24hr patch Place 1 patch (14 mg total) onto the skin daily. 28 patch 0  . nitroGLYCERIN (NITROSTAT) 0.4 MG SL tablet Place 1 tablet (0.4 mg total) under the tongue every 5 (five) minutes as needed for chest pain. 25 tablet 98  . ondansetron (ZOFRAN) 4 MG tablet TAKE 1 TABLET (4 MG TOTAL) BY MOUTH DAILY AS NEEDED. 20 tablet 5  . oxyCODONE (OXY IR/ROXICODONE) 5 MG immediate release tablet Take 1 tablet (5 mg total) by mouth 2 (two) times daily as needed for severe pain. Must last 30 days. 60 tablet 0  . [START ON 01/28/2020] oxyCODONE (OXY IR/ROXICODONE) 5 MG immediate release tablet Take 1 tablet (5 mg total) by mouth 2 (two) times daily as needed for severe pain. Must last 30 days. 60 tablet 0  . [START ON 02/27/2020] oxyCODONE (OXY IR/ROXICODONE) 5 MG immediate release tablet Take 1 tablet (5 mg total) by mouth 2 (two) times daily as needed for severe pain. Must last 30 days. 60 tablet 0  . rosuvastatin (CRESTOR) 10 MG tablet Take 1 tablet (10 mg total) by mouth daily. 90 tablet 3  . spironolactone (ALDACTONE) 25 MG tablet Take 1 tablet (25 mg total) by mouth daily. 90 tablet 3  . amLODipine (NORVASC) 10 MG tablet Take 1 tablet (10 mg total) by mouth daily. 90 tablet 3   No current facility-administered medications on file prior to visit.    Allergies  Allergen Reactions  . Chantix [Varenicline Tartrate] Other (  See Comments)    Feels "crazy"     Family History  Adopted: Yes  Problem Relation Age of Onset  .  Diabetes Mother   . Diabetes Father   . Alcohol abuse Father   . Heart disease Father   . Drug abuse Father   . Stroke Sister   . Anxiety disorder Sister     BP (!) 170/102 (BP Location: Left Arm, Patient Position: Sitting, Cuff Size: Large) Comment: compared bilaterally. Unchanged  Pulse (!) 114   Ht '5\' 7"'$  (1.702 m)   Wt 204 lb 9.6 oz (92.8 kg)   SpO2 98%   BMI 32.04 kg/m    Review of Systems She denies hypoglycemia    Objective:   Physical Exam VITAL SIGNS:  See vs page GENERAL: no distress Pulses: dorsalis pedis absent bilat.   MSK: no deformity of the feet CV: no leg edema Skin:  no ulcer on the feet.  normal color and temp on the feet. Neuro: sensation is intact to touch on the feet  Lab Results  Component Value Date   HGBA1C 11.0 (A) 12/04/2019     Lab Results  Component Value Date   TSH 1.693 09/15/2019       Assessment & Plan:  Tachycardia, persistent HTN: is noted today Insulin-requiring type 2 DM, with PAD: she needs increased rx Obesity: farxiga might help  Patient Instructions  Your blood pressure is high today.  Please see your primary care provider soon, to have it rechecked check your blood sugar 4 times a day: before the 3 meals, and at bedtime.  also check if you have symptoms of your blood sugar being too high or too low.  please keep a record of the readings and bring it to your next appointment here (or you can bring the meter itself).  You can write it on any piece of paper.  please call us sooner if your blood sugar goes below 70, or if you have a lot of readings over 200.  Please increase Lantus to 75 units each morning, and:  continue Humalog 3 units 3 times a day (just before each meal), only if blood sugar is over 250, and: I have sent a prescription to your pharmacy, to add "Wilder Glade."  Please come back for a follow-up appointment in 1 month.

## 2020-01-08 NOTE — Patient Instructions (Addendum)
Your blood pressure is high today.  Please see your primary care provider soon, to have it rechecked check your blood sugar 4 times a day: before the 3 meals, and at bedtime.  also check if you have symptoms of your blood sugar being too high or too low.  please keep a record of the readings and bring it to your next appointment here (or you can bring the meter itself).  You can write it on any piece of paper.  please call us sooner if your blood sugar goes below 70, or if you have a lot of readings over 200.  Please increase Lantus to 75 units each morning, and:  continue Humalog 3 units 3 times a day (just before each meal), only if blood sugar is over 250, and: I have sent a prescription to your pharmacy, to add "Wilder Glade."  Please come back for a follow-up appointment in 1 month.

## 2020-01-13 ENCOUNTER — Telehealth: Payer: Self-pay

## 2020-01-13 NOTE — Telephone Encounter (Signed)
Received notification from [facility] that Erika [name] will require a PA. Provided Dr. Loanne Drilling with this information to determine if he would like to proceed with PA. Per Dr. Loanne Drilling, similar medication is preferred. Called pt to make her aware to call her insurance co to discuss alternatives to [place]. Will await her returned call.

## 2020-01-14 NOTE — Telephone Encounter (Signed)
Patient returning call to discuss alternatives to [facility] like a return call back to discuss

## 2020-01-14 NOTE — Telephone Encounter (Signed)
Returned pt call as requested and LVM requesting she return my call.

## 2020-01-14 NOTE — Telephone Encounter (Signed)
Patient called back and advised that per her insurance they will cover Wilder Glade as long as there is documentation provided that she took Metformin in the past.  If not Januvia is what the insurance can cover

## 2020-01-15 ENCOUNTER — Encounter: Payer: Self-pay | Admitting: Endocrinology

## 2020-01-15 NOTE — Telephone Encounter (Signed)
Pt took Metformin from 2016 to 2018

## 2020-01-15 NOTE — Telephone Encounter (Signed)
please contact patient: Has she taken metformin?

## 2020-01-15 NOTE — Progress Notes (Signed)
Subjective:    Patient ID: Erika [name], female    DOB: [birth year], 49 y.o.   MRN: [id number]  Chief Complaint  Patient presents with  . Hypertension    HPI Patient is in today for a follow-up with HTN.  Her Vit-Betti Goodenow 46-month-ago was 28.26.  There is a future order for a CBC in the system. She is UTD on all other labs including UDS and immunizations. Per [place] PMP pt is compliant without red flags.   Past Medical History:  Diagnosis Date  . Arthralgia of temporomandibular joint   . CAD, multiple vessel    a. cath 06/29/16: ostLM 40%, ostLAD 40%, pLAD 95%, ost-pLCx 60%, pLCx 95%, mLCx 60%, mRCA 95%, D2 50%, LVSF nl;  b. 07/2016 CABG x 4 (LIMA->LAD, VG->Diag, VG->OM, VG->RCA); c. 08/2016 Cath: 3VD w/ 4/4 patent grafts. LAD distal to LIMA has diff dzs->Med rx.  . [place]    a. 07/2016 s/p R CEA.  . Clotting disorder ([facility]   . Depression   . Diastolic dysfunction    a. echo 06/28/16: EF 50-55%, mild inf wall HK, GR1DD, mild MR, RV sys fxn nl, mildly dilated LA, PASP nl  . Fatty liver disease, nonalcoholic 23734 . HLD (hyperlipidemia)   . Labile hypertension    a. prior renal ngiogram negative for RAS in 03/2016; b. catecholamines and metanephrines normal, mildly elevated renin with normal aldosterone and normal ratio in 02/2016  . Obesity   . PTSD (post-traumatic stress disorder)   . Tobacco abuse    a. 2018 - cut back from 2 ppd to 0.5 ppd.  . Type 2 diabetes mellitus ([facility] January 2017    Past Surgical History:  Procedure Laterality Date  . ABDOMINAL AORTOGRAM W/LOWER EXTREMITY [name] 10/15/2018   Procedure: ABDOMINAL AORTOGRAM W/LOWER EXTREMITY;  Surgeon: AWellington Hampshire MD;  [facility];  Service: Cardiovascular;  Laterality: [name];  . ABDOMINAL AORTOGRAM W/LOWER EXTREMITY Bilateral 08/19/2019   Procedure: ABDOMINAL AORTOGRAM W/LOWER EXTREMITY;  Surgeon: AWellington Hampshire MD;  [facility];  Service: Cardiovascular;  Laterality:  Bilateral;  . CARDIAC CATHETERIZATION [name] 06/29/2016   Procedure: Left Heart Cath and Coronary Angiography;  Surgeon: TMinna Merritts MD;  [facility];  Service: Cardiovascular;  Laterality: [name];  . CARDIAC CATHETERIZATION [name] 08/29/2016   Procedure: Left Heart Cath and Cors/Grafts Angiography;  Surgeon: MWellington Hampshire MD;  [facility];  Service: Cardiovascular;  Laterality: [name];  . CESAREAN SECTION    . CHOLECYSTECTOMY    . CORONARY ARTERY BYPASS GRAFT [name] 07/06/2016   Procedure: CORONARY ARTERY BYPASS GRAFTING (CABG) x four, using left internal mammary artery and right leg greater saphenous vein harvested endoscopically;  Surgeon: PIvin Poot MD;  [facility]  Service: Open Heart Surgery;  Laterality: [name];  . ENDARTERECTOMY Right 07/06/2016   Procedure: ENDARTERECTOMY CAROTID;  Surgeon: TRosetta Posner MD;  [place] [facility]  Service: Vascular;  Laterality: Right;  . PERIPHERAL VASCULAR CATHETERIZATION [name] 04/18/2016   Procedure: Renal Angiography;  Surgeon: MWellington Hampshire MD;  [facility];  Service: Cardiovascular;  Laterality: [name];  . PERIPHERAL VASCULAR INTERVENTION Left 10/15/2018   Procedure: PERIPHERAL VASCULAR INTERVENTION;  Surgeon: AWellington Hampshire MD;  [facility];  Service: Cardiovascular;  Laterality: Left;  Left superficial femoral  . TEE WITHOUT CARDIOVERSION [name] 07/06/2016   Procedure: TRANSESOPHAGEAL ECHOCARDIOGRAM (TEE);  Surgeon: PIvin Poot MD;  [facility];  Service: Open Heart Surgery;  Laterality: [name];  . TONSILLECTOMY      Family History  Adopted: Yes  Problem Relation Age of Onset  . Diabetes Mother   . Diabetes Father   . Alcohol abuse Father   . Heart disease Father   . Drug abuse Father   . Stroke Sister   . Anxiety disorder Sister     Social History   Socioeconomic History  . Marital status: Married    Spouse name: Not on file  . Number of children: 1  . Years of  education: 68  . Highest education level: Not on file  Occupational History  . Occupation: Disability  Tobacco Use  . Smoking status: Current Every Day Smoker    Packs/day: 1.00    Years: 27.00    Pack years: 27.00    Types: Cigarettes  . Smokeless tobacco: Never Used  . Tobacco comment: patient smokes when she drives.   Substance and Sexual Activity  . Alcohol use: Yes    Alcohol/week: 0.0 standard drinks    Comment: socially  . Drug use: No  . Sexual activity: Yes    Partners: Male    Birth control/protection: None  Other Topics Concern  . Not on file  Social History Narrative   Lives with husband   Caffeine use: Drinks 2 cups coffee per day   Right handed   Social Determinants of Health   Financial Resource Strain:   . Difficulty of Paying Living Expenses: Not on file  Food Insecurity:   . Worried About Charity fundraiser in the Last Year: Not on file  . Ran Out of Food in the Last Year: Not on file  Transportation Needs:   . Lack of Transportation (Medical): Not on file  . Lack of Transportation (Non-Medical): Not on file  Physical Activity:   . Days of Exercise per Week: Not on file  . Minutes of Exercise per Session: Not on file  Stress:   . Feeling of Stress : Not on file  Social Connections:   . Frequency of Communication with Friends and Family: Not on file  . Frequency of Social Gatherings with Friends and Family: Not on file  . Attends Religious Services: Not on file  . Active Member of Clubs or Organizations: Not on file  . Attends Archivist Meetings: Not on file  . Marital Status: Not on file  Intimate Partner Violence:   . Fear of Current or Ex-Partner: Not on file  . Emotionally Abused: Not on file  . Physically Abused: Not on file  . Sexually Abused: Not on file    Outpatient Medications Prior to Visit  Medication Sig Dispense Refill  . albuterol (PROVENTIL HFA;VENTOLIN HFA) 108 (90 Base) MCG/ACT inhaler Inhale 2 puffs into the  lungs every 6 (six) hours as needed for wheezing. 1 Inhaler 0  . aspirin EC 81 MG tablet Take 1 tablet (81 mg total) by mouth daily. 90 tablet 3  . benztropine (COGENTIN) 0.5 MG tablet Take 1 tablet (0.5 mg total) by mouth daily. 90 tablet 0  . chlorproMAZINE (THORAZINE) 50 MG tablet Take 1.5 tablets (75 mg total) by mouth at bedtime. 135 tablet 0  . Cholecalciferol (VITAMIN D3) 125 MCG (5000 UT) CAPS Take 1 capsule (5,000 Units total) by mouth daily with breakfast. Take along with calcium and magnesium. 90 capsule 1  . clonazePAM (KLONOPIN) 0.5 MG tablet Take 1 tablet (0.5 mg total) by mouth  3 (three) times daily as needed for anxiety. 90 tablet 2  . cloNIDine (CATAPRES) 0.1 MG tablet Take 1 tablet (0.1 mg total) by mouth 2 (two) times daily. 60 tablet 0  . clopidogrel (PLAVIX) 75 MG tablet TAKE 1 TABLET BY MOUTH EVERY DAY 30 tablet 3  . Evolocumab (REPATHA SURECLICK) 003 MG/ML SOAJ Inject 1 pen into the skin every 14 (fourteen) days. 2 pen 11  . FLUoxetine HCl 60 MG TABS Take 60 mg by mouth daily. 90 tablet 0  . Insulin Glargine (LANTUS SOLOSTAR) 100 UNIT/ML Solostar Pen Inject 75 Units into the skin every morning. And pen needles 1/day 10 pen PRN  . insulin lispro (HUMALOG KWIKPEN) 100 UNIT/ML KwikPen Inject 0.03 mLs (3 Units total) into the skin 3 (three) times daily with meals. Take only if blood sugar is over 250.  Also pen needles 4/day 15 mL 11  . Insulin Syringe-Needle U-100 29G X 1/2" 0.3 ML MISC 1 each by Does not apply route 2 (two) times daily. 30 each 0  . lamoTRIgine (LAMICTAL) 200 MG tablet Take 1 tablet (200 mg total) by mouth daily. 90 tablet 0  . Magnesium 500 MG CAPS Take 1 capsule (500 mg total) by mouth 2 (two) times daily at 8 am and 10 pm. 180 capsule 1  . naloxone (NARCAN) nasal spray 4 mg/0.1 mL 4 mg (contents of 1 nasal spray) as a single dose in one nostril; may repeat every 2 to 3 minutes in alternating nostrils until medical assistance becomes available 1 kit 0  .  nitroGLYCERIN (NITROSTAT) 0.4 MG SL tablet Place 1 tablet (0.4 mg total) under the tongue every 5 (five) minutes as needed for chest pain. 25 tablet 98  . oxyCODONE (OXY IR/ROXICODONE) 5 MG immediate release tablet Take 1 tablet (5 mg total) by mouth 2 (two) times daily as needed for severe pain. Must last 30 days. 60 tablet 0  . [START ON 01/28/2020] oxyCODONE (OXY IR/ROXICODONE) 5 MG immediate release tablet Take 1 tablet (5 mg total) by mouth 2 (two) times daily as needed for severe pain. Must last 30 days. 60 tablet 0  . [START ON 02/27/2020] oxyCODONE (OXY IR/ROXICODONE) 5 MG immediate release tablet Take 1 tablet (5 mg total) by mouth 2 (two) times daily as needed for severe pain. Must last 30 days. 60 tablet 0  . pregabalin (LYRICA) 225 MG capsule Take 225 mg by mouth 3 (three) times daily.    . rosuvastatin (CRESTOR) 10 MG tablet Take 1 tablet (10 mg total) by mouth daily. 90 tablet 3  . amLODipine (NORVASC) 10 MG tablet Take 1 tablet (10 mg total) by mouth daily. 90 tablet 3  . carvedilol (COREG) 25 MG tablet TAKE 1 TABLET (25 MG TOTAL) BY MOUTH 2 (TWO) TIMES DAILY WITH A MEAL. 60 tablet 5  . isosorbide mononitrate (IMDUR) 30 MG 24 hr tablet Take 1 tablet (30 mg total) by mouth daily. 90 tablet 1  . losartan (COZAAR) 100 MG tablet Take 1 tablet (100 mg total) by mouth daily. 30 tablet 0  . magic mouthwash SOLN Take 15 mLs by mouth 3 (three) times daily as needed for mouth pain. 250 mL 1  . nicotine (NICODERM CQ) 14 mg/24hr patch Place 1 patch (14 mg total) onto the skin daily. 28 patch 0  . ondansetron (ZOFRAN) 4 MG tablet TAKE 1 TABLET (4 MG TOTAL) BY MOUTH DAILY AS NEEDED. 20 tablet 5  . spironolactone (ALDACTONE) 25 MG tablet Take 1 tablet (25 mg  total) by mouth daily. 90 tablet 3  . dapagliflozin propanediol (FARXIGA) 5 MG TABS tablet Take 5 mg by mouth daily before breakfast. (Patient not taking: Reported on 01/18/2020) 30 tablet 11  . cyclobenzaprine (FEXMID) 7.5 MG tablet TAKE 1 TABLET BY  MOUTH THREE TIMES A DAY AS NEEDED FOR MUSCLE SPASMS 90 tablet 5   No facility-administered medications prior to visit.    Allergies  Allergen Reactions  . Chantix [Varenicline Tartrate] Other (See Comments)    Feels "crazy"     Review of Systems  Constitutional: Negative for fever.  HENT: Negative for hearing loss and sinus pain.   Eyes: Negative for blurred vision, discharge and redness.  Respiratory: Negative for cough and shortness of breath.   Cardiovascular: Negative for chest pain.  Gastrointestinal: Negative for heartburn.  Genitourinary: Negative for dysuria.  Musculoskeletal: Negative for myalgias.  Skin: Negative for rash.  Neurological: Negative for dizziness and loss of consciousness.  Endo/Heme/Allergies: Does not bruise/bleed easily.  Psychiatric/Behavioral: Negative for depression and memory loss.  All other systems reviewed and are negative.      Objective:    Physical Exam Vitals and nursing note reviewed.  Constitutional:      Appearance: Normal appearance. She is not ill-appearing.  HENT:     Head: Normocephalic and atraumatic.     Right Ear: External ear normal.     Left Ear: External ear normal.     Nose: Nose normal.  Eyes:     General:        Right eye: No discharge.        Left eye: No discharge.  Cardiovascular:     Rate and Rhythm: Normal rate and regular rhythm.     Heart sounds: Normal heart sounds.  Pulmonary:     Effort: Pulmonary effort is normal.     Breath sounds: No wheezing.  Abdominal:     Palpations: Abdomen is soft. There is no mass.     Tenderness: There is no abdominal tenderness. There is no guarding.  Musculoskeletal:        General: Normal range of motion.     Right lower leg: No edema.     Left lower leg: No edema.  Skin:    General: Skin is dry.  Neurological:     Mental Status: She is alert and oriented to person, place, and time.     Deep Tendon Reflexes: Reflexes normal.  Psychiatric:        Mood and  Affect: Mood normal.        Behavior: Behavior normal.     BP (!) 144/90 (BP Location: Left Arm, Patient Position: Sitting, Cuff Size: Normal)   Pulse 81   Temp (!) 96.8 F (36 C) (Temporal)   Wt 203 lb 6.4 oz (92.3 kg)   LMP 01/10/2020   SpO2 96%   BMI 31.86 kg/m  Wt Readings from Last 3 Encounters:  01/18/20 203 lb 6.4 oz (92.3 kg)  01/08/20 204 lb 9.6 oz (92.8 kg)  12/04/19 203 lb 12.8 oz (92.4 kg)     Lab Results  Component Value Date   WBC 15.4 (H) 09/15/2019   HGB 16.7 (H) 09/15/2019   HCT 48.6 (H) 09/15/2019   PLT 453 (H) 09/15/2019   GLUCOSE 87 09/07/2019   CHOL 157 09/07/2019   TRIG 191.0 (H) 09/07/2019   HDL 38.70 (L) 09/07/2019   LDLDIRECT 116.0 06/29/2019   LDLCALC 80 09/07/2019   ALT 23 09/07/2019   AST 16 09/07/2019  NA 137 09/07/2019   K 3.8 09/07/2019   CL 103 09/07/2019   CREATININE 0.50 09/07/2019   BUN 9 09/07/2019   CO2 26 09/07/2019   TSH 1.693 09/15/2019   INR 0.9 06/29/2019   HGBA1C 11.0 (A) 12/04/2019   MICROALBUR 21.2 (H) 08/15/2017    Lab Results  Component Value Date   TSH 1.693 09/15/2019   Lab Results  Component Value Date   WBC 15.4 (H) 09/15/2019   HGB 16.7 (H) 09/15/2019   HCT 48.6 (H) 09/15/2019   MCV 87.9 09/15/2019   PLT 453 (H) 09/15/2019   Lab Results  Component Value Date   NA 137 09/07/2019   K 3.8 09/07/2019   CO2 26 09/07/2019   GLUCOSE 87 09/07/2019   BUN 9 09/07/2019   CREATININE 0.50 09/07/2019   BILITOT 0.4 09/07/2019   ALKPHOS 73 09/07/2019   AST 16 09/07/2019   ALT 23 09/07/2019   PROT 7.0 09/07/2019   ALBUMIN 4.1 09/07/2019   CALCIUM 10.0 09/07/2019   ANIONGAP 12 02/25/2019   GFR 132.97 09/07/2019   Lab Results  Component Value Date   CHOL 157 09/07/2019   Lab Results  Component Value Date   HDL 38.70 (L) 09/07/2019   Lab Results  Component Value Date   LDLCALC 80 09/07/2019   Lab Results  Component Value Date   TRIG 191.0 (H) 09/07/2019   Lab Results  Component Value Date    CHOLHDL 4 09/07/2019   Lab Results  Component Value Date   HGBA1C 11.0 (A) 12/04/2019       Assessment & Plan:   Problem List Items Addressed This Visit      Active Problems   Essential hypertension, malignant (Chronic)   Relevant Medications   amLODipine (NORVASC) 10 MG tablet   carvedilol (COREG) 25 MG tablet   losartan (COZAAR) 100 MG tablet   isosorbide mononitrate (IMDUR) 30 MG 24 hr tablet   spironolactone (ALDACTONE) 25 MG tablet   Vitamin Welcome Fults deficiency (Chronic)   Relevant Orders   VITAMIN Breon Diss 25 Hydroxy (Vit-Karry Causer Deficiency, Fractures)   Diabetes mellitus due to underlying condition, uncontrolled, with circulatory complication, with long-term current use of insulin (HCC) - Primary   Relevant Medications   losartan (COZAAR) 100 MG tablet    Other Visit Diagnoses    PTSD (post-traumatic stress disorder)       Panic attack       Smoking addiction       Relevant Medications   nicotine (NICODERM CQ) 14 mg/24hr patch      I am having Jerlean M. Barkett maintain her Insulin Syringe-Needle U-100, aspirin EC, rosuvastatin, naloxone, albuterol, insulin lispro, lamoTRIgine, FLUoxetine HCl, clonazePAM, benztropine, chlorproMAZINE, clopidogrel, oxyCODONE, oxyCODONE, oxyCODONE, Vitamin D3, Magnesium, Repatha SureClick, cloNIDine, nitroGLYCERIN, Farxiga, Lantus SoloStar, cyclobenzaprine, pregabalin, amLODipine, carvedilol, nicotine, ondansetron, magic mouthwash, losartan, isosorbide mononitrate, and spironolactone.  Meds ordered this encounter  Medications  . cyclobenzaprine (FEXMID) 7.5 MG tablet    Sig: TAKE 1 TABLET BY MOUTH THREE TIMES A DAY AS NEEDED FOR MUSCLE SPASMS    Dispense:  90 tablet    Refill:  11  . amLODipine (NORVASC) 10 MG tablet    Sig: Take 1 tablet (10 mg total) by mouth daily.    Dispense:  90 tablet    Refill:  3  . carvedilol (COREG) 25 MG tablet    Sig: Take 1 tablet (25 mg total) by mouth 2 (two) times daily with a meal.    Dispense:  60  tablet     Refill:  11  . nicotine (NICODERM CQ) 14 mg/24hr patch    Sig: Place 1 patch (14 mg total) onto the skin daily.    Dispense:  28 patch    Refill:  11  . ondansetron (ZOFRAN) 4 MG tablet    Sig: Take 1 tablet (4 mg total) by mouth daily as needed.    Dispense:  20 tablet    Refill:  11  . magic mouthwash SOLN    Sig: Take 15 mLs by mouth 3 (three) times daily as needed for mouth pain.    Dispense:  250 mL    Refill:  1  . losartan (COZAAR) 100 MG tablet    Sig: Take 1 tablet (100 mg total) by mouth daily.    Dispense:  30 tablet    Refill:  11  . isosorbide mononitrate (IMDUR) 30 MG 24 hr tablet    Sig: Take 1 tablet (30 mg total) by mouth daily.    Dispense:  90 tablet    Refill:  3  . spironolactone (ALDACTONE) 25 MG tablet    Sig: Take 1 tablet (25 mg total) by mouth daily.    Dispense:  90 tablet    Refill:  3    This visit occurred during the SARS-CoV-2 public health emergency.  Safety protocols were in place, including screening questions prior to the visit, additional usage of staff PPE, and extensive cleaning of exam room while observing appropriate contact time as indicated for disinfecting solutions.    Arnette Norris, MD

## 2020-01-15 NOTE — Telephone Encounter (Signed)
Please advise 

## 2020-01-15 NOTE — Telephone Encounter (Signed)
Please do PA.  They will need to know why she stopped it

## 2020-01-15 NOTE — Telephone Encounter (Signed)
Called pt to inquire about why she stopped Metformin. States "it was not working". Also attempted to verify updated insurance info. However, what is in our chart and what pt currently has does not match. Pt is active through My Chart. Advised pt to upload new insurance card via My Chart. Also advised I will forward this message to Dr. Loanne Drilling to make him aware and will complete PA with all required clinical information once we receive new insurance card.

## 2020-01-18 ENCOUNTER — Telehealth: Payer: Self-pay

## 2020-01-18 ENCOUNTER — Ambulatory Visit (INDEPENDENT_AMBULATORY_CARE_PROVIDER_SITE_OTHER): Payer: Managed Care, Other (non HMO) | Admitting: Family Medicine

## 2020-01-18 ENCOUNTER — Other Ambulatory Visit: Payer: Self-pay

## 2020-01-18 ENCOUNTER — Encounter: Payer: Self-pay | Admitting: Family Medicine

## 2020-01-18 VITALS — BP 144/90 | HR 81 | Temp 96.8°F | Wt 203.4 lb

## 2020-01-18 DIAGNOSIS — E0865 Diabetes mellitus due to underlying condition with hyperglycemia: Secondary | ICD-10-CM | POA: Diagnosis not present

## 2020-01-18 DIAGNOSIS — E0859 Diabetes mellitus due to underlying condition with other circulatory complications: Secondary | ICD-10-CM

## 2020-01-18 DIAGNOSIS — F17200 Nicotine dependence, unspecified, uncomplicated: Secondary | ICD-10-CM

## 2020-01-18 DIAGNOSIS — E559 Vitamin D deficiency, unspecified: Secondary | ICD-10-CM

## 2020-01-18 DIAGNOSIS — Z720 Tobacco use: Secondary | ICD-10-CM

## 2020-01-18 DIAGNOSIS — F410 Panic disorder [episodic paroxysmal anxiety] without agoraphobia: Secondary | ICD-10-CM

## 2020-01-18 DIAGNOSIS — I10 Essential (primary) hypertension: Secondary | ICD-10-CM | POA: Diagnosis not present

## 2020-01-18 DIAGNOSIS — Z794 Long term (current) use of insulin: Secondary | ICD-10-CM

## 2020-01-18 DIAGNOSIS — IMO0002 Reserved for concepts with insufficient information to code with codable children: Secondary | ICD-10-CM

## 2020-01-18 DIAGNOSIS — F4310 Post-traumatic stress disorder, unspecified: Secondary | ICD-10-CM

## 2020-01-18 LAB — VITAMIN D 25 HYDROXY (VIT D DEFICIENCY, FRACTURES): VITD: 14.27 ng/mL — ABNORMAL LOW (ref 30.00–100.00)

## 2020-01-18 MED ORDER — ISOSORBIDE MONONITRATE ER 30 MG PO TB24: 30.0000 mg | ORAL_TABLET | Freq: Every day | ORAL | 3 refills | Status: DC

## 2020-01-18 MED ORDER — AMLODIPINE BESYLATE 10 MG PO TABS: 10.0000 mg | ORAL_TABLET | Freq: Every day | ORAL | 3 refills | Status: DC

## 2020-01-18 MED ORDER — MAGIC MOUTHWASH: 15.0000 mL | Freq: Three times a day (TID) | ORAL | 1 refills | Status: DC | PRN

## 2020-01-18 MED ORDER — LOSARTAN POTASSIUM 100 MG PO TABS: 100.0000 mg | ORAL_TABLET | Freq: Every day | ORAL | 11 refills | Status: DC

## 2020-01-18 MED ORDER — CARVEDILOL 25 MG PO TABS: 25.0000 mg | ORAL_TABLET | Freq: Two times a day (BID) | ORAL | 11 refills | Status: DC

## 2020-01-18 MED ORDER — SPIRONOLACTONE 25 MG PO TABS: 25.0000 mg | ORAL_TABLET | Freq: Every day | ORAL | 3 refills | Status: DC

## 2020-01-18 MED ORDER — CYCLOBENZAPRINE HCL 7.5 MG PO TABS: ORAL_TABLET | ORAL | 11 refills | Status: DC

## 2020-01-18 MED ORDER — ONDANSETRON HCL 4 MG PO TABS: 4.0000 mg | ORAL_TABLET | Freq: Every day | ORAL | 11 refills | Status: DC | PRN

## 2020-01-18 MED ORDER — NICOTINE 14 MG/24HR TD PT24: 14.0000 mg | MEDICATED_PATCH | TRANSDERMAL | 11 refills | Status: DC

## 2020-01-18 NOTE — Telephone Encounter (Signed)
PRIOR AUTHORIZATION  PA initiation date: 01/18/20   Insurance Company: [employer] - NOTE: Name on insurance card DOES NOT match Epic Submission completed electronically through Cover My Meds: Yes  Will await insurance response re: approval/denial.  APPROVAL  Received notification from [employer] that [place] for [place] has been approved 12/25/19 through 01/17/21.   Erika [name] (Key: [id number])  This request has been approved.  Please note any additional information provided by [employer] at the bottom of your screen.  Erika [name] (Key: Ronny Flurry) Farxiga 5MG  tablets   Form [employer] Electronic PA Form Created 4 minutes ago Sent to Plan 2 minutes ago Plan Response 2 minutes ago Submit Clinical Questions 2 minutes ago Determination Favorable 2 minutes ago Message from [employer];Review Type:Prior Auth;Coverage Start Date:12/25/2019;Coverage End Date:01/17/2021;

## 2020-01-18 NOTE — Patient Instructions (Signed)
Great to see you. I will call you with your lab results from today and you can view them online.   

## 2020-01-18 NOTE — Assessment & Plan Note (Signed)
History:  Currently taking Cozaar 100 mg daily, Imdur 30 mg daily, aldactone 25 mg daily, Norvasc 10 mg daily. .   Review: taking medications as instructed, no medication side effects noted, no TIAs, no chest pain on exertion, no dyspnea on exertion, no swelling of ankles. Smoker: No.  BP Readings from Last 3 Encounters:  01/18/20 (!) 144/90  01/08/20 (!) 170/102  12/04/19 (!) 154/100   Lab Results  Component Value Date   CREATININE 0.50 09/07/2019     Assessment/Plan: 1. Medication: no change. 2. Dietary sodium restriction. 3. Regular aerobic exercise.

## 2020-01-18 NOTE — Assessment & Plan Note (Signed)
History: Social History   Tobacco Use  . Smoking status: Current Every Day Smoker    Packs/day: 1.00    Years: 27.00    Pack years: 27.00    Types: Cigarettes  . Smokeless tobacco: Never Used  . Tobacco comment: patient smokes when she drives.   Substance Use Topics  . Alcohol use: Yes    Alcohol/week: 0.0 standard drinks    Comment: socially   Assessment/Plan: The patient was counseled on the dangers of tobacco use, and was advised to quit.  Reviewed strategies to maximize success, including removing cigarettes and smoking materials from environment, stress management, support of family/friends, written materials, local smoking cessation programs (1-800-QUIT-NOW and SMOKEFREE.GOV) and pharmacotherapy. Greater than (3 OR 10) minutes were spent on counseling today.

## 2020-01-21 ENCOUNTER — Other Ambulatory Visit: Payer: Self-pay

## 2020-01-21 DIAGNOSIS — E559 Vitamin D deficiency, unspecified: Secondary | ICD-10-CM

## 2020-01-21 MED ORDER — ERGOCALCIFEROL 1.25 MG (50000 UT) PO CAPS: 50000.0000 [IU] | ORAL_CAPSULE | ORAL | 0 refills | Status: AC

## 2020-01-21 NOTE — Progress Notes (Signed)
Created future order for lab per TA/thx dmf

## 2020-01-21 NOTE — Progress Notes (Signed)
Rx sent for vit-Erika [name] per TA/thx dmf

## 2020-01-22 ENCOUNTER — Other Ambulatory Visit: Payer: Self-pay

## 2020-01-22 MED ORDER — CLONIDINE HCL 0.1 MG PO TABS: 0.1000 mg | ORAL_TABLET | Freq: Two times a day (BID) | ORAL | 0 refills | Status: DC

## 2020-02-02 ENCOUNTER — Inpatient Hospital Stay: Payer: Managed Care, Other (non HMO)

## 2020-02-02 ENCOUNTER — Inpatient Hospital Stay: Payer: Managed Care, Other (non HMO) | Admitting: Internal Medicine

## 2020-02-03 ENCOUNTER — Other Ambulatory Visit (HOSPITAL_COMMUNITY): Payer: Self-pay | Admitting: *Deleted

## 2020-02-03 DIAGNOSIS — F4310 Post-traumatic stress disorder, unspecified: Secondary | ICD-10-CM

## 2020-02-05 ENCOUNTER — Encounter: Payer: Self-pay | Admitting: Family

## 2020-02-05 ENCOUNTER — Other Ambulatory Visit: Payer: Self-pay

## 2020-02-05 ENCOUNTER — Ambulatory Visit (INDEPENDENT_AMBULATORY_CARE_PROVIDER_SITE_OTHER): Payer: Managed Care, Other (non HMO) | Admitting: Family

## 2020-02-05 VITALS — BP 142/88 | HR 84 | Ht 67.0 in | Wt 198.5 lb

## 2020-02-05 DIAGNOSIS — I10 Essential (primary) hypertension: Secondary | ICD-10-CM | POA: Diagnosis not present

## 2020-02-05 DIAGNOSIS — I2510 Atherosclerotic heart disease of native coronary artery without angina pectoris: Secondary | ICD-10-CM

## 2020-02-05 DIAGNOSIS — I739 Peripheral vascular disease, unspecified: Secondary | ICD-10-CM | POA: Diagnosis not present

## 2020-02-05 DIAGNOSIS — Z720 Tobacco use: Secondary | ICD-10-CM

## 2020-02-05 DIAGNOSIS — I6523 Occlusion and stenosis of bilateral carotid arteries: Secondary | ICD-10-CM

## 2020-02-05 MED ORDER — ISOSORBIDE MONONITRATE ER 60 MG PO TB24: 60.0000 mg | ORAL_TABLET | Freq: Every day | ORAL | 1 refills | Status: DC

## 2020-02-05 NOTE — Progress Notes (Signed)
Office Visit    Patient Name: Erika [name] Date of Encounter: 02/05/2020  Primary Care Provider:  Lucille Passy, MD Primary Cardiologist:  [name], MD Electrophysiologist:  None   Chief Complaint    Erika [name] is a 49 y.o. female with a hx of CAD s/p CABG, carotid disease s/p R carotid endarterectomy, HTN, PAD s/p stenting left SFA, tobacco abuse presents today for follow up of CAD and PAD.   Past Medical History    Past Medical History:  Diagnosis Date  . Arthralgia of temporomandibular joint   . CAD, multiple vessel    a. cath 06/29/16: ostLM 40%, ostLAD 40%, pLAD 95%, ost-pLCx 60%, pLCx 95%, mLCx 60%, mRCA 95%, D2 50%, LVSF nl;  b. 07/2016 CABG x 4 (LIMA->LAD, VG->Diag, VG->OM, VG->RCA); c. 08/2016 Cath: 3VD w/ 4/4 patent grafts. LAD distal to LIMA has diff dzs->Med rx.  . Carotid arterial disease ([place])    a. 07/2016 s/p R CEA.  . Clotting disorder ([place])   . Depression   . Diastolic dysfunction    a. echo 06/28/16: EF 50-55%, mild inf wall HK, GR1DD, mild MR, RV sys fxn nl, mildly dilated LA, PASP nl  . Fatty liver disease, nonalcoholic [id number]  . HLD (hyperlipidemia)   . Labile hypertension    a. prior renal ngiogram negative for RAS in 03/2016; b. catecholamines and metanephrines normal, mildly elevated renin with normal aldosterone and normal ratio in 02/2016  . Obesity   . PTSD (post-traumatic stress disorder)   . Tobacco abuse    a. 2018 - cut back from 2 ppd to 0.5 ppd.  . Type 2 diabetes mellitus [facility]) January 2017   Past Surgical History:  Procedure Laterality Date  . ABDOMINAL AORTOGRAM W/LOWER EXTREMITY [name] 10/15/2018   Procedure: ABDOMINAL AORTOGRAM W/LOWER EXTREMITY;  Surgeon: Wellington Hampshire, MD;  Location: [place] [facility];  Service: Cardiovascular;  Laterality: [name];  . ABDOMINAL AORTOGRAM W/LOWER EXTREMITY Bilateral 08/19/2019   Procedure: ABDOMINAL AORTOGRAM W/LOWER EXTREMITY;  Surgeon: Wellington Hampshire, MD;  Location: [place] [facility];  Service: Cardiovascular;  Laterality: Bilateral;  . CARDIAC CATHETERIZATION [name] 06/29/2016   Procedure: Left Heart Cath and Coronary Angiography;  Surgeon: Minna Merritts, MD;  Location: [place] [facility];  Service: Cardiovascular;  Laterality: [name];  . CARDIAC CATHETERIZATION [name] 08/29/2016   Procedure: Left Heart Cath and Cors/Grafts Angiography;  Surgeon: Wellington Hampshire, MD;  Location: [place] [facility];  Service: Cardiovascular;  Laterality: [name];  . CESAREAN SECTION    . CHOLECYSTECTOMY    . CORONARY ARTERY BYPASS GRAFT [name] 07/06/2016   Procedure: CORONARY ARTERY BYPASS GRAFTING (CABG) x four, using left internal mammary artery and right leg greater saphenous vein harvested endoscopically;  Surgeon: Ivin Poot, MD;  Location: [place];  Service: Open Heart Surgery;  Laterality: [name];  . ENDARTERECTOMY Right 07/06/2016   Procedure: ENDARTERECTOMY CAROTID;  Surgeon: Rosetta Posner, MD;  Location: [place];  Service: Vascular;  Laterality: Right;  . PERIPHERAL VASCULAR CATHETERIZATION [name] 04/18/2016   Procedure: Renal Angiography;  Surgeon: Wellington Hampshire, MD;  Location: [place] [facility];  Service: Cardiovascular;  Laterality: [name];  . PERIPHERAL VASCULAR INTERVENTION Left 10/15/2018   Procedure: PERIPHERAL VASCULAR INTERVENTION;  Surgeon: Wellington Hampshire, MD;  Location: [place] [facility];  Service: Cardiovascular;  Laterality: Left;  Left superficial femoral  . TEE WITHOUT CARDIOVERSION [name] 07/06/2016   Procedure: TRANSESOPHAGEAL ECHOCARDIOGRAM (TEE);  Surgeon: Tharon Aquas Trigt,  MD;  Location: [facility];  Service: Open Heart Surgery;  Laterality: [name];  . TONSILLECTOMY      Allergies  Allergies  Allergen Reactions  . Chantix [Varenicline Tartrate] Other (See Comments)    Feels "crazy"     History of Present Illness    Erika [name] is a 50 y.o. female with a hx of CAD s/p CABG, carotid disease s/p R carotid endarterectomy, HTN, PAD s/p stenting left SFA, tobacco abuse  last  seen 09/15/2019 by Dr. Fletcher Anon.  Her CAD is s/p previous NSTEMI 2017.  Found to have three-vessel coronary artery disease at the time and underwent CABG and right carotid endarterectomy.  Rehospitalized September 2017 with chest pain in the setting of uncontrolled hypertension.  Cardiac catheterization showed patent graft.  LAD with noted diffuse disease distal to the anastomosis and very small in caliber.  EF normal with mildly elevated LVEDP.  Her blood pressure has been difficult to control and labile.  She had previous angiography which showed no renal artery stenosis.  She has a history of intolerance of hydralazine due to GI symptoms.  Her PAD is s/p previous stenting of the left SFA.  She was evaluated by Dr. Fletcher Anon for worsening left calf claudication with drop in ABI.  She had angiography 07/2019 showing no significant aortoiliac disease, patent left SFA stent, proximal portion of SFA diffusely diseased with short occlusion distal to stent.  Additionally, anterior tibial diffusely diseased and occluded distally.  Peroneal artery diffusely diseased.  No revascularization performed.  Today she denies chest pain, pressure, tightness.  She reports no DOE.  Reports she is down to smoking about a pack and a half a week and continues to work on quitting.  She reports her bilateral leg pain is stable.  Tells me she can walk around her house once without pain but if she tries to do it twice she notices pain.  Tells me this is about the same as when she saw Dr. Fletcher Anon in September.  EKGs/Labs/Other Studies Reviewed:   The following studies were reviewed today:  EKG:  EKG is ordered today.  The ekg ordered today demonstrates SR 84 bpm with nonspecific T wave changes  Recent Labs: 09/07/2019: ALT 23; BUN 9; Creatinine, Ser 0.50; Potassium 3.8; Sodium 137 09/15/2019: Hemoglobin 16.7; Platelets 453; TSH 1.693  Recent Lipid Panel    Component Value Date/Time   CHOL 157 09/07/2019 0846   CHOL 121 08/21/2016  0817   TRIG 191.0 (H) 09/07/2019 0846   HDL 38.70 (L) 09/07/2019 0846   HDL 24 (L) 08/21/2016 0817   CHOLHDL 4 09/07/2019 0846   VLDL 38.2 09/07/2019 0846   LDLCALC 80 09/07/2019 0846   LDLCALC 64 08/21/2016 0817   LDLDIRECT 116.0 06/29/2019 1040    Home Medications   Current Meds  Medication Sig  . albuterol (PROVENTIL HFA;VENTOLIN HFA) 108 (90 Base) MCG/ACT inhaler Inhale 2 puffs into the lungs every 6 (six) hours as needed for wheezing.  Marland Kitchen amLODipine (NORVASC) 10 MG tablet Take 1 tablet (10 mg total) by mouth daily.  Marland Kitchen aspirin EC 81 MG tablet Take 1 tablet (81 mg total) by mouth daily.  . benztropine (COGENTIN) 0.5 MG tablet Take 1 tablet (0.5 mg total) by mouth daily.  . carvedilol (COREG) 25 MG tablet Take 1 tablet (25 mg total) by mouth 2 (two) times daily with a meal.  . chlorproMAZINE (THORAZINE) 50 MG tablet Take 1.5 tablets (75 mg total) by mouth at bedtime.  . Cholecalciferol (VITAMIN  D3) 125 MCG (5000 UT) CAPS Take 1 capsule (5,000 Units total) by mouth daily with breakfast. Take along with calcium and magnesium.  . clonazePAM (KLONOPIN) 0.5 MG tablet Take 1 tablet (0.5 mg total) by mouth 3 (three) times daily as needed for anxiety.  . cloNIDine (CATAPRES) 0.1 MG tablet Take 1 tablet (0.1 mg total) by mouth 2 (two) times daily. Please call to schedule office visit for further refills. Thank you!  . clopidogrel (PLAVIX) 75 MG tablet TAKE 1 TABLET BY MOUTH EVERY DAY  . cyclobenzaprine (FEXMID) 7.5 MG tablet TAKE 1 TABLET BY MOUTH THREE TIMES A DAY AS NEEDED FOR MUSCLE SPASMS  . dapagliflozin propanediol (FARXIGA) 5 MG TABS tablet Take 5 mg by mouth daily before breakfast.  . ergocalciferol (VITAMIN D2) 1.25 MG (50000 UT) capsule Take 1 capsule (50,000 Units total) by mouth 2 (two) times a week.  . Evolocumab (REPATHA SURECLICK) XX123456 MG/ML SOAJ Inject 1 pen into the skin every 14 (fourteen) days.  Marland Kitchen FLUoxetine HCl 60 MG TABS Take 60 mg by mouth daily.  . Insulin Glargine (LANTUS  SOLOSTAR) 100 UNIT/ML Solostar Pen Inject 75 Units into the skin every morning. And pen needles 1/day  . insulin lispro (HUMALOG KWIKPEN) 100 UNIT/ML KwikPen Inject 0.03 mLs (3 Units total) into the skin 3 (three) times daily with meals. Take only if blood sugar is over 250.  Also pen needles 4/day  . Insulin Syringe-Needle U-100 29G X 1/2" 0.3 ML MISC 1 each by Does not apply route 2 (two) times daily.  . isosorbide mononitrate (IMDUR) 30 MG 24 hr tablet Take 1 tablet (30 mg total) by mouth daily.  Marland Kitchen lamoTRIgine (LAMICTAL) 200 MG tablet Take 1 tablet (200 mg total) by mouth daily.  Marland Kitchen losartan (COZAAR) 100 MG tablet Take 1 tablet (100 mg total) by mouth daily.  . magic mouthwash SOLN Take 15 mLs by mouth 3 (three) times daily as needed for mouth pain.  . Magnesium 500 MG CAPS Take 1 capsule (500 mg total) by mouth 2 (two) times daily at 8 am and 10 pm.  . naloxone (NARCAN) nasal spray 4 mg/0.1 mL 4 mg (contents of 1 nasal spray) as a single dose in one nostril; may repeat every 2 to 3 minutes in alternating nostrils until medical assistance becomes available  . nicotine (NICODERM CQ) 14 mg/24hr patch Place 1 patch (14 mg total) onto the skin daily.  . nitroGLYCERIN (NITROSTAT) 0.4 MG SL tablet Place 1 tablet (0.4 mg total) under the tongue every 5 (five) minutes as needed for chest pain.  Marland Kitchen ondansetron (ZOFRAN) 4 MG tablet Take 1 tablet (4 mg total) by mouth daily as needed.  Marland Kitchen oxyCODONE (OXY IR/ROXICODONE) 5 MG immediate release tablet Take 1 tablet (5 mg total) by mouth 2 (two) times daily as needed for severe pain. Must last 30 days.  . pregabalin (LYRICA) 225 MG capsule Take 225 mg by mouth 3 (three) times daily.  . rosuvastatin (CRESTOR) 10 MG tablet Take 1 tablet (10 mg total) by mouth daily.  Marland Kitchen spironolactone (ALDACTONE) 25 MG tablet Take 1 tablet (25 mg total) by mouth daily.      Review of Systems      Review of Systems  Constitution: Negative for chills, fever and malaise/fatigue.    Cardiovascular: Positive for claudication. Negative for chest pain, dyspnea on exertion, leg swelling, near-syncope, orthopnea, palpitations and syncope.  Respiratory: Negative for cough, shortness of breath and wheezing.   Gastrointestinal: Negative for nausea and vomiting.  Neurological: Negative for dizziness, light-headedness and weakness.   All other systems reviewed and are otherwise negative except as noted above.  Physical Exam    VS:  BP (!) 142/88 (BP Location: Left Arm, Patient Position: Sitting, Cuff Size: Normal)   Pulse 84   Ht 5\' 7"  (1.702 m)   Wt 198 lb 8 oz (90 kg)   LMP 01/10/2020   SpO2 98%   BMI 31.09 kg/m  , BMI Body mass index is 31.09 kg/m. GEN: Well nourished, well developed, in no acute distress. HEENT: normal. Neck: Supple, no JVD, carotid bruits, or masses. Cardiac: RRR, no  rubs, or gallops. 1/6 systolic murmur pulonary area. No clubbing, cyanosis, edema.  Radials 2+ and equal bilaterally.  Respiratory:  Respirations regular and unlabored, clear to auscultation bilaterally. GI: Soft, nontender, nondistended, BS + x 4. MS: No deformity or atrophy. Skin: Warm and dry, no rash. Neuro:  Strength and sensation are intact. Psych: Normal affect.  Assessment & Plan    1. CAD -stable with no anginal symptoms.  No indication for ischemic evaluation.  S/p CABG July 2017.  Continue GDMT aspirin, plavix, beta-blocker, statin.  2. PAD with left calf claudication -due to diffuse disease in the SFA with occlusion distally.  Also noted with aggressive disease below the knee.  Per Dr. Velva Harman endovascular revascularization of left SFA is possible but low suspicion she would be a good long-term patency.  We will continue medical therapy (Aspirin, Plavix, Repatha, Crestor), walking program.  Symptoms currently stable.  Per Dr. Pasty Spillers revascularization is needed her best option is femoral-popliteal bypass.  3. Carotid disease s/p R carotid endarterectomy -due for repeat  duplex, this will be scheduled today.  Continue lipid control.  Smoking cessation encouraged.  4. HTN -BP not yet at goal of <130/80 despite multiple medications. Increase Imdur from 30 mg to 60mg  today. Per Dr. Fletcher Anon she will be candidate for renal denervation if therapy becomes approved.  5. HLD -LDL goal less than 70 in the setting of PAD, CAD.  Continue rosuvastatin and Repatha.  We will obtain fasting lipid profile prior to her next office visit.  6. Tobacco abuse - Has cut back to 1.5 packs/week.  Encouraged her to continue to cut back.Smoking cessation encouraged. Recommend utilization of [id number].  7. DM2 - Continue to follow with endocrinology.  Appreciate inclusion of SGLT2 I for cardioprotective benefit.  Disposition: Follow up in 3 month(s) with Dr. Fletcher Anon or APP with labs prior.  Loel Dubonnet, NP 02/05/2020, 11:04 AM

## 2020-02-05 NOTE — Patient Instructions (Addendum)
Medication Instructions:   Your physician has recommended you make the following change in your medication:   CHANGE Imdur to 60mg  daily  You may use up your 30mg  by taking 2 tablets daily  *If you need a refill on your cardiac medications before your next appointment, please call your pharmacy*  Lab Work: No lab work today.  Your physician recommends that you return for lab work 3-5 days prior to your next office visit for CBC, CMET, lipid panel. Please be fasting for these labs.   Testing/Procedures: Your physician has requested that you have a carotid duplex. This test is an ultrasound of the carotid arteries in your neck. It looks at blood flow through these arteries that supply the brain with blood. Allow one hour for this exam. There are no restrictions or special instructions.  You had an EKG today which showed normal sinus rhythm. This is a good result!  Follow-Up: At [name], you and your health needs are our priority.  As part of our continuing mission to provide you with exceptional heart care, we have created designated Provider Care Teams.  These Care Teams include your primary Cardiologist (physician) and Advanced Practice Providers (APPs -  Physician Assistants and Nurse Practitioners) who all work together to provide you with the care you need, when you need it.  Your next appointment:  4 months with Dr. Fletcher Anon with labs 3-5 days prior (lipid, CBC, CMET)  Other:  Keep up the good work with cutting back on smoking! You're doing great.

## 2020-02-08 ENCOUNTER — Other Ambulatory Visit (HOSPITAL_COMMUNITY): Payer: Self-pay | Admitting: Psychiatry

## 2020-02-08 DIAGNOSIS — F410 Panic disorder [episodic paroxysmal anxiety] without agoraphobia: Secondary | ICD-10-CM

## 2020-02-08 DIAGNOSIS — F4310 Post-traumatic stress disorder, unspecified: Secondary | ICD-10-CM

## 2020-02-09 ENCOUNTER — Ambulatory Visit: Payer: Managed Care, Other (non HMO) | Admitting: Endocrinology

## 2020-02-09 ENCOUNTER — Other Ambulatory Visit: Payer: Self-pay

## 2020-02-09 ENCOUNTER — Encounter: Payer: Self-pay | Admitting: Endocrinology

## 2020-02-09 VITALS — BP 102/70 | HR 91 | Ht 67.0 in | Wt 196.2 lb

## 2020-02-09 DIAGNOSIS — Z794 Long term (current) use of insulin: Secondary | ICD-10-CM | POA: Diagnosis not present

## 2020-02-09 DIAGNOSIS — IMO0002 Reserved for concepts with insufficient information to code with codable children: Secondary | ICD-10-CM

## 2020-02-09 DIAGNOSIS — E0859 Diabetes mellitus due to underlying condition with other circulatory complications: Secondary | ICD-10-CM

## 2020-02-09 DIAGNOSIS — E1151 Type 2 diabetes mellitus with diabetic peripheral angiopathy without gangrene: Secondary | ICD-10-CM | POA: Diagnosis not present

## 2020-02-09 DIAGNOSIS — E1165 Type 2 diabetes mellitus with hyperglycemia: Secondary | ICD-10-CM

## 2020-02-09 DIAGNOSIS — E0865 Diabetes mellitus due to underlying condition with hyperglycemia: Secondary | ICD-10-CM

## 2020-02-09 LAB — POCT GLYCOSYLATED HEMOGLOBIN (HGB A1C): Hemoglobin A1C: 10 % — AB (ref 4.0–5.6)

## 2020-02-09 MED ORDER — TOUJEO MAX SOLOSTAR 300 UNIT/ML ~~LOC~~ SOPN: 90.0000 [IU] | PEN_INJECTOR | SUBCUTANEOUS | 11 refills | Status: DC

## 2020-02-09 NOTE — Patient Instructions (Signed)
I have sent a prescription to your pharmacy, to change Lantus to toujeo, 90 units each morning, and: Please continue the same other diabetes medications Please come back for a follow-up appointment in 2 months.  check your blood sugar twice a day.  vary the time of day when you check, between before the 3 meals, and at bedtime.  also check if you have symptoms of your blood sugar being too high or too low.  please keep a record of the readings and bring it to your next appointment here (or you can bring the meter itself).  You can write it on any piece of paper.  please call us sooner if your blood sugar goes below 70, or if you have a lot of readings over 200.

## 2020-02-09 NOTE — Progress Notes (Signed)
Subjective:    Patient ID: Erika [name], female    DOB: 1971-04-05, 49 y.o.   MRN: [id number]  HPI Pt returns for f/u of diabetes mellitus:  DM type: Insulin-requiring type 2 Dx'ed: 8250 Complications: CAD and PAD Therapy: insulin since 2017, and farxiga GDM: never DKA: never Severe hypoglycemia: never Pancreatitis: never Pancreatic imaging: normal on 2010 CT SDOH: due to noncompliance with insulin, she is not a candidate for multiple daily injections.   Other: she is not a candidate for GLP med, due to chronic nausea.  Interval history: no cbg record, but states cbg's vary from 150-300's.  Pt says she takes insulin as rx'ed.  pt states she feels well in general.   Past Medical History:  Diagnosis Date  . Arthralgia of temporomandibular joint   . CAD, multiple vessel    a. cath 06/29/16: ostLM 40%, ostLAD 40%, pLAD 95%, ost-pLCx 60%, pLCx 95%, mLCx 60%, mRCA 95%, D2 50%, LVSF nl;  b. 07/2016 CABG x 4 (LIMA->LAD, VG->Diag, VG->OM, VG->RCA); c. 08/2016 Cath: 3VD w/ 4/4 patent grafts. LAD distal to LIMA has diff dzs->Med rx.  . Carotid arterial disease ([place])    a. 07/2016 s/p R CEA.  . Clotting disorder ([place])   . Depression   . Diastolic dysfunction    a. echo 06/28/16: EF 50-55%, mild inf wall HK, GR1DD, mild MR, RV sys fxn nl, mildly dilated LA, PASP nl  . Fatty liver disease, nonalcoholic 0370  . HLD (hyperlipidemia)   . Labile hypertension    a. prior renal ngiogram negative for RAS in 03/2016; b. catecholamines and metanephrines normal, mildly elevated renin with normal aldosterone and normal ratio in 02/2016  . Obesity   . PTSD (post-traumatic stress disorder)   . Tobacco abuse    a. 2018 - cut back from 2 ppd to 0.5 ppd.  . Type 2 diabetes mellitus [facility]) January 2017    Past Surgical History:  Procedure Laterality Date  . ABDOMINAL AORTOGRAM W/LOWER EXTREMITY [name] 10/15/2018   Procedure: ABDOMINAL AORTOGRAM W/LOWER EXTREMITY;  Surgeon: Wellington Hampshire, MD;  Location:  [place] [facility];  Service: Cardiovascular;  Laterality: [name];  . ABDOMINAL AORTOGRAM W/LOWER EXTREMITY Bilateral 08/19/2019   Procedure: ABDOMINAL AORTOGRAM W/LOWER EXTREMITY;  Surgeon: Wellington Hampshire, MD;  Location: [place] [facility];  Service: Cardiovascular;  Laterality: Bilateral;  . CARDIAC CATHETERIZATION [name] 06/29/2016   Procedure: Left Heart Cath and Coronary Angiography;  Surgeon: Minna Merritts, MD;  Location: [place] [facility];  Service: Cardiovascular;  Laterality: [name];  . CARDIAC CATHETERIZATION [name] 08/29/2016   Procedure: Left Heart Cath and Cors/Grafts Angiography;  Surgeon: Wellington Hampshire, MD;  Location: [place] [facility];  Service: Cardiovascular;  Laterality: [name];  . CESAREAN SECTION    . CHOLECYSTECTOMY    . CORONARY ARTERY BYPASS GRAFT [name] 07/06/2016   Procedure: CORONARY ARTERY BYPASS GRAFTING (CABG) x four, using left internal mammary artery and right leg greater saphenous vein harvested endoscopically;  Surgeon: Ivin Poot, MD;  Location: [place];  Service: Open Heart Surgery;  Laterality: [name];  . ENDARTERECTOMY Right 07/06/2016   Procedure: ENDARTERECTOMY CAROTID;  Surgeon: Rosetta Posner, MD;  Location: [place];  Service: Vascular;  Laterality: Right;  . PERIPHERAL VASCULAR CATHETERIZATION [name] 04/18/2016   Procedure: Renal Angiography;  Surgeon: Wellington Hampshire, MD;  Location: [place] [facility];  Service: Cardiovascular;  Laterality: [name];  . PERIPHERAL VASCULAR INTERVENTION Left 10/15/2018   Procedure: PERIPHERAL VASCULAR INTERVENTION;  Surgeon:  Wellington Hampshire, MD;  Location: [place] [facility];  Service: Cardiovascular;  Laterality: Left;  Left superficial femoral  . TEE WITHOUT CARDIOVERSION [name] 07/06/2016   Procedure: TRANSESOPHAGEAL ECHOCARDIOGRAM (TEE);  Surgeon: Ivin Poot, MD;  Location: [place];  Service: Open Heart Surgery;  Laterality: [name];  . TONSILLECTOMY      Social History   Socioeconomic History  . Marital status: Married    Spouse name:  Not on file  . Number of children: 1  . Years of education: 21  . Highest education level: Not on file  Occupational History  . Occupation: Disability  Tobacco Use  . Smoking status: Current Every Day Smoker    Packs/day: 1.00    Years: 27.00    Pack years: 27.00    Types: Cigarettes  . Smokeless tobacco: Never Used  . Tobacco comment: patient smokes when she drives.   Substance and Sexual Activity  . Alcohol use: Yes    Alcohol/week: 0.0 standard drinks    Comment: socially  . Drug use: No  . Sexual activity: Yes    Partners: Male    Birth control/protection: None  Other Topics Concern  . Not on file  Social History Narrative   Lives with husband   Caffeine use: Drinks 2 cups coffee per day   Right handed   Social Determinants of Health   Financial Resource Strain:   . Difficulty of Paying Living Expenses: Not on file  Food Insecurity:   . Worried About Charity fundraiser in the Last Year: Not on file  . Ran Out of Food in the Last Year: Not on file  Transportation Needs:   . Lack of Transportation (Medical): Not on file  . Lack of Transportation (Non-Medical): Not on file  Physical Activity:   . Days of Exercise per Week: Not on file  . Minutes of Exercise per Session: Not on file  Stress:   . Feeling of Stress : Not on file  Social Connections:   . Frequency of Communication with Friends and Family: Not on file  . Frequency of Social Gatherings with Friends and Family: Not on file  . Attends Religious Services: Not on file  . Active Member of Clubs or Organizations: Not on file  . Attends Archivist Meetings: Not on file  . Marital Status: Not on file  Intimate Partner Violence:   . Fear of Current or Ex-Partner: Not on file  . Emotionally Abused: Not on file  . Physically Abused: Not on file  . Sexually Abused: Not on file    Current Outpatient Medications on File Prior to Visit  Medication Sig Dispense Refill  . albuterol (PROVENTIL  HFA;VENTOLIN HFA) 108 (90 Base) MCG/ACT inhaler Inhale 2 puffs into the lungs every 6 (six) hours as needed for wheezing. 1 Inhaler 0  . amLODipine (NORVASC) 10 MG tablet Take 1 tablet (10 mg total) by mouth daily. 90 tablet 3  . aspirin EC 81 MG tablet Take 1 tablet (81 mg total) by mouth daily. 90 tablet 3  . benztropine (COGENTIN) 0.5 MG tablet Take 1 tablet (0.5 mg total) by mouth daily. 90 tablet 0  . carvedilol (COREG) 25 MG tablet Take 1 tablet (25 mg total) by mouth 2 (two) times daily with a meal. 60 tablet 11  . chlorproMAZINE (THORAZINE) 50 MG tablet Take 1.5 tablets (75 mg total) by mouth at bedtime. 135 tablet 0  . Cholecalciferol (VITAMIN D3) 125 MCG (5000 UT) CAPS Take  1 capsule (5,000 Units total) by mouth daily with breakfast. Take along with calcium and magnesium. 90 capsule 1  . clonazePAM (KLONOPIN) 0.5 MG tablet Take 1 tablet (0.5 mg total) by mouth 3 (three) times daily as needed for anxiety. 90 tablet 2  . cloNIDine (CATAPRES) 0.1 MG tablet Take 1 tablet (0.1 mg total) by mouth 2 (two) times daily. Please call to schedule office visit for further refills. Thank you! 60 tablet 0  . cyclobenzaprine (FEXMID) 7.5 MG tablet TAKE 1 TABLET BY MOUTH THREE TIMES A DAY AS NEEDED FOR MUSCLE SPASMS 90 tablet 11  . dapagliflozin propanediol (FARXIGA) 5 MG TABS tablet Take 5 mg by mouth daily before breakfast. 30 tablet 11  . ergocalciferol (VITAMIN D2) 1.25 MG (50000 UT) capsule Take 1 capsule (50,000 Units total) by mouth 2 (two) times a week. 12 capsule 0  . Evolocumab (REPATHA SURECLICK) 774 MG/ML SOAJ Inject 1 pen into the skin every 14 (fourteen) days. 2 pen 11  . FLUoxetine HCl 60 MG TABS Take 60 mg by mouth daily. 90 tablet 0  . insulin lispro (HUMALOG KWIKPEN) 100 UNIT/ML KwikPen Inject 0.03 mLs (3 Units total) into the skin 3 (three) times daily with meals. Take only if blood sugar is over 250.  Also pen needles 4/day 15 mL 11  . Insulin Syringe-Needle U-100 29G X 1/2" 0.3 ML MISC 1  each by Does not apply route 2 (two) times daily. 30 each 0  . isosorbide mononitrate (IMDUR) 60 MG 24 hr tablet Take 1 tablet (60 mg total) by mouth daily. 90 tablet 1  . lamoTRIgine (LAMICTAL) 200 MG tablet Take 1 tablet (200 mg total) by mouth daily. 90 tablet 0  . losartan (COZAAR) 100 MG tablet Take 1 tablet (100 mg total) by mouth daily. 30 tablet 11  . magic mouthwash SOLN Take 15 mLs by mouth 3 (three) times daily as needed for mouth pain. 250 mL 1  . Magnesium 500 MG CAPS Take 1 capsule (500 mg total) by mouth 2 (two) times daily at 8 am and 10 pm. 180 capsule 1  . naloxone (NARCAN) nasal spray 4 mg/0.1 mL 4 mg (contents of 1 nasal spray) as a single dose in one nostril; may repeat every 2 to 3 minutes in alternating nostrils until medical assistance becomes available 1 kit 0  . nicotine (NICODERM CQ) 14 mg/24hr patch Place 1 patch (14 mg total) onto the skin daily. 28 patch 11  . nitroGLYCERIN (NITROSTAT) 0.4 MG SL tablet Place 1 tablet (0.4 mg total) under the tongue every 5 (five) minutes as needed for chest pain. 25 tablet 98  . ondansetron (ZOFRAN) 4 MG tablet Take 1 tablet (4 mg total) by mouth daily as needed. 20 tablet 11  . oxyCODONE (OXY IR/ROXICODONE) 5 MG immediate release tablet Take 1 tablet (5 mg total) by mouth 2 (two) times daily as needed for severe pain. Must last 30 days. 60 tablet 0  . [START ON 02/27/2020] oxyCODONE (OXY IR/ROXICODONE) 5 MG immediate release tablet Take 1 tablet (5 mg total) by mouth 2 (two) times daily as needed for severe pain. Must last 30 days. 60 tablet 0  . pregabalin (LYRICA) 225 MG capsule Take 225 mg by mouth 3 (three) times daily.    . rosuvastatin (CRESTOR) 10 MG tablet Take 1 tablet (10 mg total) by mouth daily. 90 tablet 3  . spironolactone (ALDACTONE) 25 MG tablet Take 1 tablet (25 mg total) by mouth daily. 90 tablet 3  .  oxyCODONE (OXY IR/ROXICODONE) 5 MG immediate release tablet Take 1 tablet (5 mg total) by mouth 2 (two) times daily as  needed for severe pain. Must last 30 days. 60 tablet 0   No current facility-administered medications on file prior to visit.    Allergies  Allergen Reactions  . Chantix [Varenicline Tartrate] Other (See Comments)    Feels "crazy"     Family History  Adopted: Yes  Problem Relation Age of Onset  . Diabetes Mother   . Diabetes Father   . Alcohol abuse Father   . Heart disease Father   . Drug abuse Father   . Stroke Sister   . Anxiety disorder Sister     BP 102/70 (BP Location: Right Arm, Patient Position: Sitting, Cuff Size: Large)   Pulse 91   Ht '5\' 7"'$  (1.702 m)   Wt 196 lb 3.2 oz (89 kg)   LMP 01/10/2020   SpO2 98%   BMI 30.73 kg/m    Review of Systems She denies hypoglycemia.  She has lost a few lbs    Objective:   Physical Exam VITAL SIGNS:  See vs page GENERAL: no distress Pulses: dorsalis pedis intact bilat.   MSK: no deformity of the feet CV: no leg edema Skin:  no ulcer on the feet.  normal color and temp on the feet. Neuro: sensation is intact to touch on the feet  Lab Results  Component Value Date   HGBA1C 10.0 (A) 02/09/2020   Lab Results  Component Value Date   CREATININE 0.50 09/07/2019   BUN 9 09/07/2019   NA 137 09/07/2019   K 3.8 09/07/2019   CL 103 09/07/2019   CO2 26 09/07/2019       Assessment & Plan:  Insulin-requiring type 2 DM, with PAD: she needs increased rx.     Patient Instructions  I have sent a prescription to your pharmacy, to change Lantus to toujeo, 90 units each morning, and: Please continue the same other diabetes medications Please come back for a follow-up appointment in 2 months.  check your blood sugar twice a day.  vary the time of day when you check, between before the 3 meals, and at bedtime.  also check if you have symptoms of your blood sugar being too high or too low.  please keep a record of the readings and bring it to your next appointment here (or you can bring the meter itself).  You can write it on  any piece of paper.  please call us sooner if your blood sugar goes below 70, or if you have a lot of readings over 200.

## 2020-02-11 ENCOUNTER — Other Ambulatory Visit: Payer: Self-pay | Admitting: Cardiovascular Disease

## 2020-02-15 ENCOUNTER — Other Ambulatory Visit: Payer: Self-pay

## 2020-02-15 ENCOUNTER — Ambulatory Visit (INDEPENDENT_AMBULATORY_CARE_PROVIDER_SITE_OTHER): Payer: 59 | Admitting: Psychiatry

## 2020-02-15 ENCOUNTER — Encounter (HOSPITAL_COMMUNITY): Payer: Self-pay | Admitting: Psychiatry

## 2020-02-15 DIAGNOSIS — F410 Panic disorder [episodic paroxysmal anxiety] without agoraphobia: Secondary | ICD-10-CM

## 2020-02-15 DIAGNOSIS — F4310 Post-traumatic stress disorder, unspecified: Secondary | ICD-10-CM | POA: Diagnosis not present

## 2020-02-15 DIAGNOSIS — F331 Major depressive disorder, recurrent, moderate: Secondary | ICD-10-CM | POA: Diagnosis not present

## 2020-02-15 MED ORDER — LAMOTRIGINE 200 MG PO TABS: 200.0000 mg | ORAL_TABLET | Freq: Every day | ORAL | 0 refills | Status: DC

## 2020-02-15 MED ORDER — CLONAZEPAM 0.5 MG PO TABS: 0.5000 mg | ORAL_TABLET | Freq: Three times a day (TID) | ORAL | 2 refills | Status: DC | PRN

## 2020-02-15 MED ORDER — CHLORPROMAZINE HCL 50 MG PO TABS: 75.0000 mg | ORAL_TABLET | Freq: Every day | ORAL | 0 refills | Status: DC

## 2020-02-15 MED ORDER — BENZTROPINE MESYLATE 0.5 MG PO TABS: 0.5000 mg | ORAL_TABLET | Freq: Every day | ORAL | 0 refills | Status: DC

## 2020-02-15 MED ORDER — FLUOXETINE HCL 60 MG PO TABS: 60.0000 mg | ORAL_TABLET | Freq: Every day | ORAL | 0 refills | Status: DC
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
## 2020-02-19 ENCOUNTER — Telehealth: Payer: Self-pay | Admitting: Pain Medicine

## 2020-02-19 ENCOUNTER — Other Ambulatory Visit: Payer: Self-pay

## 2020-02-19 MED ORDER — CLONIDINE HCL 0.1 MG PO TABS: 0.1000 mg | ORAL_TABLET | Freq: Two times a day (BID) | ORAL | 3 refills | Status: DC

## 2020-02-19 NOTE — Telephone Encounter (Signed)
Dr Dossie Arbour, Pregabalin was last ordered by you on 09-14-2019 with 2 refills. Patient had a virtual appointment on 12-28-2019 and it was not reordered.  Patient is to run out today of the Pregabalin.  Could you please reorder.

## 2020-02-19 NOTE — Telephone Encounter (Signed)
Pt called stating she needs a refill of pregabalin that it runs out today. I looked at pts meds to see if there was a refill but I don't see that Dr Delane Ginger ever prescribed this for pt. But let me know if she needs an appt.

## 2020-02-22 ENCOUNTER — Encounter: Payer: Self-pay | Admitting: Pain Medicine

## 2020-02-22 ENCOUNTER — Telehealth: Payer: Self-pay | Admitting: Pain Medicine

## 2020-02-22 NOTE — Telephone Encounter (Signed)
Patient is out of Lyrica and appt was sched for 03-28-20. I moved appt to Tue at 2:45 for MM Virtual. Please call patient and update chart

## 2020-02-22 NOTE — Telephone Encounter (Signed)
Patient has an appointment for this afternoon.

## 2020-02-22 NOTE — Telephone Encounter (Signed)
completed

## 2020-02-23 ENCOUNTER — Telehealth: Payer: Self-pay | Admitting: *Deleted

## 2020-02-23 ENCOUNTER — Other Ambulatory Visit: Payer: Self-pay

## 2020-02-23 ENCOUNTER — Ambulatory Visit: Payer: Managed Care, Other (non HMO) | Attending: Pain Medicine | Admitting: Pain Medicine

## 2020-02-23 DIAGNOSIS — R6884 Jaw pain: Secondary | ICD-10-CM | POA: Diagnosis not present

## 2020-02-23 DIAGNOSIS — R519 Headache, unspecified: Secondary | ICD-10-CM | POA: Diagnosis not present

## 2020-02-23 DIAGNOSIS — H9201 Otalgia, right ear: Secondary | ICD-10-CM

## 2020-02-23 DIAGNOSIS — G894 Chronic pain syndrome: Secondary | ICD-10-CM | POA: Diagnosis not present

## 2020-02-23 DIAGNOSIS — M792 Neuralgia and neuritis, unspecified: Secondary | ICD-10-CM

## 2020-02-23 DIAGNOSIS — B0221 Postherpetic geniculate ganglionitis: Secondary | ICD-10-CM | POA: Diagnosis not present

## 2020-02-23 DIAGNOSIS — G8929 Other chronic pain: Secondary | ICD-10-CM

## 2020-02-23 MED ORDER — OXYCODONE HCL 5 MG PO TABS: 5.0000 mg | ORAL_TABLET | Freq: Two times a day (BID) | ORAL | 0 refills | Status: DC | PRN

## 2020-02-23 MED ORDER — PREGABALIN 225 MG PO CAPS: 225.0000 mg | ORAL_CAPSULE | Freq: Three times a day (TID) | ORAL | 5 refills | Status: DC

## 2020-02-23 NOTE — Progress Notes (Signed)
Patient: Erika [name]  Service Category: E/M  Provider: Gaspar Cola, MD  DOB: [birth year]  DOS: 02/23/2020  Location: Office  MRN: [id number]  Setting: Ambulatory outpatient  Referring Provider: Lucille Passy, MD  Type: Established Patient  Specialty: Interventional Pain Management  PCP: Erika Passy, MD  Location: Remote location  Delivery: TeleHealth     Virtual Encounter - Pain Management PROVIDER NOTE: Information contained herein reflects review and annotations entered in association with encounter. Interpretation of such information and data should be left to medically-trained personnel. Information provided to patient can be located elsewhere in the medical record under "Patient Instructions". Document created using STT-dictation technology, any transcriptional errors that may result from process are unintentional.    Contact & Pharmacy Preferred: [phone] Home: [phone] (home) Mobile: [phone] (mobile) E-mail: [email or web address]  [name]Phone: [phone]: [phone]  Pre-screening  Erika [name] offered "in-person" vs "virtual" encounter. She indicated preferring virtual for this encounter.   Reason COVID-19*  Social distancing based on CDC and AMA recommendations.   I contacted Erika SAWYERon 02/23/2020 via telephone.      I clearly identified myself as FGaspar Cola MD. I verified that I was speaking with the correct person using two identifiers (Name: Erika [name] and date of birth: [birth year].  Consent I sought verbal advanced consent from Erika Grimefor virtual visit interactions. I informed Erika [name] of possible security and privacy concerns, risks, and limitations associated with providing "not-in-person" medical evaluation and management services. I also informed Erika [name] of the availability of "in-person"  appointments. Finally, I informed her that there would be a charge for the virtual visit and that she could be  personally, fully or partially, financially responsible for it. Ms. MHeftyexpressed understanding and agreed to proceed.   Historic Elements   Ms. CERCIA CRISAFULLIis a 49y.o. year old, female patient evaluated today after her last contact with our practice on 02/22/2020. Erika [name] has a past medical history of Arthralgia of temporomandibular joint, CAD, multiple vessel, Carotid arterial disease ([place], [place]otting disorder ([place], Depression, Diastolic dysfunction, Fatty liver disease, nonalcoholic ([address], HLD (hyperlipidemia), Labile hypertension, Obesity, PTSD (post-traumatic stress disorder), Tobacco abuse, and Type 2 diabetes mellitus ([facility] (January 2017). She also  has a past surgical history that includes Cholecystectomy; Tonsillectomy; Cesarean section; Cardiac catheterization ([name], 04/18/2016); Cardiac catheterization ([name], 06/29/2016); Coronary artery bypass graft ([name], 07/06/2016); TEE without cardioversion ([name], 07/06/2016); Endarterectomy (Right, 07/06/2016); Cardiac catheterization ([name], 08/29/2016); ABDOMINAL AORTOGRAM W/LOWER EXTREMITY ([name], 10/15/2018); PERIPHERAL VASCULAR INTERVENTION (Left, 10/15/2018); and ABDOMINAL AORTOGRAM W/LOWER EXTREMITY (Bilateral, 08/19/2019). Ms. MMendibleshas a current medication list which includes the following prescription(s): albuterol, amlodipine, aspirin ec, benztropine, carvedilol, chlorpromazine, vitamin d3, clonazepam, clonidine, clopidogrel, cyclobenzaprine, farxiga, ergocalciferol, repatha sureclick, fluoxetine hcl, toujeo max solostar, insulin lispro, insulin syringe-needle u-100, isosorbide mononitrate, lamotrigine, losartan, magic mouthwash, magnesium, naloxone, nicotine, nitroglycerin, ondansetron, [START ON 02/27/2020] oxycodone, [START ON 03/28/2020] oxycodone, [START ON 04/27/2020] oxycodone, [START ON 05/27/2020] oxycodone, pregabalin, rosuvastatin,  and spironolactone. She  reports that she has been smoking cigarettes. She has a 27.00 pack-year smoking history. She has never used smokeless tobacco. She reports current alcohol use. She reports that she does not use drugs. Ms. MSemideyis allergic to chantix [varenicline tartrate].   HPI  Today, she is being contacted for medication management. The patient indicates doing well with the current medication regimen. No adverse reactions or  side effects reported to the medications.   Pharmacotherapy Assessment  Analgesic: Oxycodone IR 5 mg. 1 tab PO BID (10 mg/day of oxycodone) (enough to last to 09/30/2019) MME/day: 15 mg/day.   Monitoring: [place] PMP: PDMP reviewed during this encounter.       Pharmacotherapy: No side-effects or adverse reactions reported. Compliance: No problems identified. Effectiveness: Clinically acceptable. Plan: Refer to "POC".  UDS:  Summary  Date Value Ref Range Status  01/07/2019 FINAL  Final    Comment:    ==================================================================== TOXASSURE COMP DRUG ANALYSIS,UR ==================================================================== Test                             Result       Flag       Units Drug Present and Declared for Prescription Verification   7-aminoclonazepam              115          EXPECTED   ng/mg creat    7-aminoclonazepam is an expected metabolite of clonazepam. Source    of clonazepam is a scheduled prescription medication.   Hydrocodone                    732          EXPECTED   ng/mg creat   Hydromorphone                  158          EXPECTED   ng/mg creat   Dihydrocodeine                 212          EXPECTED   ng/mg creat   Norhydrocodone                 1339         EXPECTED   ng/mg creat    Sources of hydrocodone include scheduled prescription    medications. Hydromorphone, dihydrocodeine and norhydrocodone are    expected metabolites of hydrocodone. Hydromorphone and    dihydrocodeine are  also available as scheduled prescription    medications.   Cyclobenzaprine                PRESENT      EXPECTED   Desmethylcyclobenzaprine       PRESENT      EXPECTED    Desmethylcyclobenzaprine is an expected metabolite of    cyclobenzaprine.   Chlorpromazine                 PRESENT      EXPECTED   Acetaminophen                  PRESENT      EXPECTED Drug Absent but Declared for Prescription Verification   Lamotrigine                    Not Detected UNEXPECTED   Fluoxetine                     Not Detected UNEXPECTED   Salicylate                     Not Detected UNEXPECTED    Aspirin, as indicated in the declared medication list, is not    always detected even when used as directed.   Benztropine  Not Detected UNEXPECTED ==================================================================== Test                      Result    Flag   Units      Ref Range   Creatinine              165              mg/dL      >=20 ==================================================================== Declared Medications:  The flagging and interpretation on this report are based on the  following declared medications.  Unexpected results may arise from  inaccuracies in the declared medications.  **Note: The testing scope of this panel includes these medications:  Benztropine (Cogentin)  Chlorpromazine (Thorazine)  Clonazepam (Klonopin)  Cyclobenzaprine (Fexmid)  Fluoxetine  Hydrocodone (Norco)  Lamotrigine (Lamictal)  **Note: The testing scope of this panel does not include small to  moderate amounts of these reported medications:  Acetaminophen (Norco)  Aspirin (Aspirin 81)  **Note: The testing scope of this panel does not include following  reported medications:  Albuterol  Alirocumab  Carvedilol (Coreg)  Clopidogrel (Plavix)  Insulin  Insulin (Lantus)  Isosorbide (Imdur)  Losartan (Cozaar)  Mouthwash (Magic Mouthwash)  Naloxone (Narcan)  Nitroglycerin (Nitrostat)   Ondansetron (Zofran)  Rosuvastatin (Crestor)  Spironolactone (Aldactone)  Triamcinolone (Kenalog) ==================================================================== For clinical consultation, please call [phone]. ====================================================================    Laboratory Chemistry Profile   Renal Lab Results  Component Value Date   BUN 9 09/07/2019   CREATININE 0.50 09/07/2019   LABCREA 64.9 03/04/2019   BCR 12 08/13/2019   GFR 132.97 09/07/2019   GFRAA 122 08/13/2019   GFRNONAA 106 08/13/2019   LABVMA 1.5 03/04/2019   LABVMA 2.3 03/04/2019    Hepatic Lab Results  Component Value Date   AST 16 09/07/2019   ALT 23 09/07/2019   ALBUMIN 4.1 09/07/2019   ALKPHOS 73 09/07/2019   HCVAB <0.1 09/15/2019   LIPASE 34 05/28/2009    Electrolytes Lab Results  Component Value Date   NA 137 09/07/2019   K 3.8 09/07/2019   CL 103 09/07/2019   CALCIUM 10.0 09/07/2019   MG 2.0 01/07/2019   PHOS 3.1 05/19/2017    Bone Lab Results  Component Value Date   VD25OH 14.27 (L) 01/18/2020   25OHVITD1 16 (L) 01/07/2019   25OHVITD2 <1.0 01/07/2019   25OHVITD3 15 01/07/2019    Inflammation (CRP: Acute Phase) (ESR: Chronic Phase) Lab Results  Component Value Date   CRP 15 (H) 01/07/2019   ESRSEDRATE 17 03/17/2019   LATICACIDVEN 2.3 ([place]) 05/19/2017      Note: Above Lab results reviewed.  Imaging  DG PAIN CLINIC C-ARM 1-60 MIN NO REPORT Fluoro was used, but no Radiologist interpretation will be provided.  Please refer to "NOTES" tab for provider progress note.  Assessment  The primary encounter diagnosis was Chronic pain syndrome. Diagnoses of Geniculate Neuralgia (Right), Chronic jaw pain (Right), Chronic headaches (Primary Area of Pain) (Right), Chronic ear pain (Right), and Neurogenic pain were also pertinent to this visit.  Plan of Care  Problem-specific:  No problem-specific Assessment & Plan notes found for this encounter.  Erika [name] has a current medication list which includes the following long-term medication(s): albuterol, amlodipine, benztropine, carvedilol, chlorpromazine, vitamin d3, clonazepam, clonidine, fluoxetine hcl, toujeo max solostar, insulin lispro, isosorbide mononitrate, lamotrigine, losartan, magnesium, nitroglycerin, [START ON 02/27/2020] oxycodone, [START ON 03/28/2020] oxycodone, [START ON 04/27/2020] oxycodone, [START ON 05/27/2020] oxycodone, pregabalin, rosuvastatin, and spironolactone.  Pharmacotherapy (Medications  Ordered): Meds ordered this encounter  Medications  . oxyCODONE (OXY IR/ROXICODONE) 5 MG immediate release tablet    Sig: Take 1 tablet (5 mg total) by mouth 2 (two) times daily as needed for severe pain. Must last 30 days.    Dispense:  60 tablet    Refill:  0    Chronic Pain: STOP Act (Not applicable) Fill 1 day early if closed on refill date. Do not fill until: 03/28/2020. To last until: 04/27/2020. Avoid benzodiazepines within 8 hours of opioids  . oxyCODONE (OXY IR/ROXICODONE) 5 MG immediate release tablet    Sig: Take 1 tablet (5 mg total) by mouth 2 (two) times daily as needed for severe pain. Must last 30 days.    Dispense:  60 tablet    Refill:  0    Chronic Pain: STOP Act (Not applicable) Fill 1 day early if closed on refill date. Do not fill until: 04/27/2020. To last until: 05/27/2020. Avoid benzodiazepines within 8 hours of opioids  . oxyCODONE (OXY IR/ROXICODONE) 5 MG immediate release tablet    Sig: Take 1 tablet (5 mg total) by mouth 2 (two) times daily as needed for severe pain. Must last 30 days.    Dispense:  60 tablet    Refill:  0    Chronic Pain: STOP Act (Not applicable) Fill 1 day early if closed on refill date. Do not fill until: 05/27/2020. To last until: 06/26/2020. Avoid benzodiazepines within 8 hours of opioids  . pregabalin (LYRICA) 225 MG capsule    Sig: Take 1 capsule (225 mg total) by mouth 3 (three) times daily.    Dispense:  90 capsule    Refill:  5    Fill one  day early if pharmacy is closed on scheduled refill date. May substitute for generic if available.   Orders:  No orders of the defined types were placed in this encounter.  Follow-up plan:   Return in about 4 months (around 06/22/2020) for (VV), (MM).      Interventional management options: Considering:   NOTE: PLAVIX ANTICOAGULATION(Stop:7-10 days pre-procedure; Restart:2 hours post-proc.) Diagnostic right sided facial nerve block Diagnostic right-sided geniculate nerve block  Diagnostic right-sided occipital nerve block  Possible right-sided occipital nerveRFA Diagnostic Botox injections   Palliative PRN treatment(s):   Diagnostic left peri-insertional gluteal tendon injection #2  Diagnostic Facial (CN VII) nerve block  #4  Diagnostic right facial/Geniculatenerve block #3          Recent Visits Date Type Provider Dept  12/28/19 Telemedicine Milinda Pointer, MD [facility]  Showing recent visits within past 90 days and meeting all other requirements   Today's Visits Date Type Provider Dept  02/23/20 Telemedicine Milinda Pointer, MD [facility]  Showing today's visits and meeting all other requirements   Future Appointments No visits were found meeting these conditions.  Showing future appointments within next 90 days and meeting all other requirements   I discussed the assessment and treatment plan with the patient. The patient was provided an opportunity to ask questions and all were answered. The patient agreed with the plan and demonstrated an understanding of the instructions.  Patient advised to call back or seek an in-person evaluation if the symptoms or condition worsens.  Duration of encounter: 12 minutes.  Note by: Erika Cola, MD Date: 02/23/2020; Time: 10:03 AM

## 2020-02-25 ENCOUNTER — Other Ambulatory Visit: Payer: Self-pay

## 2020-02-25 ENCOUNTER — Telehealth: Payer: Self-pay | Admitting: Pain Medicine

## 2020-02-25 NOTE — Telephone Encounter (Signed)
Patient has script that can be filled on 02-27-2020 and was notified.

## 2020-02-25 NOTE — Telephone Encounter (Signed)
Patient called stating she had [facility] MM appt with Dr. Dossie Arbour yesterday and was supposed to pick up meds. Pharmacy told her they only have scripts for April, May, and June. Please verify this and let patient know status of what to do

## 2020-03-01 ENCOUNTER — Other Ambulatory Visit: Payer: Self-pay | Admitting: Pain Medicine

## 2020-03-21 ENCOUNTER — Ambulatory Visit (INDEPENDENT_AMBULATORY_CARE_PROVIDER_SITE_OTHER): Payer: Managed Care, Other (non HMO)

## 2020-03-21 ENCOUNTER — Other Ambulatory Visit: Payer: Self-pay

## 2020-03-21 DIAGNOSIS — I6523 Occlusion and stenosis of bilateral carotid arteries: Secondary | ICD-10-CM

## 2020-03-22 ENCOUNTER — Telehealth: Payer: Self-pay

## 2020-03-22 DIAGNOSIS — I6523 Occlusion and stenosis of bilateral carotid arteries: Secondary | ICD-10-CM

## 2020-03-22 NOTE — Telephone Encounter (Signed)
Patient made aware of carotid dopp results and Dr. Tyrell Antonio recommendation. Urgent referral for [facility] Dr. Donnetta Hutching placed. Adv the patient that [facility] will contact her directly to schedule. Patient advised to contact their office if she has not bee contacted by early next week. Patient verbalized understanding and voiced appreciation for the call.

## 2020-03-22 NOTE — Telephone Encounter (Signed)
-----   Message from Wellington Hampshire, MD sent at 03/22/2020  8:33 AM EDT ----- Right carotid stenosis is now greater than 80%. Please refer her to Dr. Donnetta Hutching at [facility] who did prior surgery. She might require surgery.

## 2020-03-28 ENCOUNTER — Telehealth: Payer: BC Managed Care – PPO | Admitting: Pain Medicine

## 2020-03-28 ENCOUNTER — Other Ambulatory Visit: Payer: Self-pay | Admitting: *Deleted

## 2020-03-28 DIAGNOSIS — I6523 Occlusion and stenosis of bilateral carotid arteries: Secondary | ICD-10-CM

## 2020-03-29 ENCOUNTER — Encounter: Payer: Self-pay | Admitting: Vascular Surgery

## 2020-03-29 ENCOUNTER — Ambulatory Visit (INDEPENDENT_AMBULATORY_CARE_PROVIDER_SITE_OTHER): Payer: Managed Care, Other (non HMO) | Admitting: Vascular Surgery

## 2020-03-29 ENCOUNTER — Other Ambulatory Visit: Payer: Self-pay

## 2020-03-29 ENCOUNTER — Ambulatory Visit (HOSPITAL_COMMUNITY)
Admission: RE | Admit: 2020-03-29 | Discharge: 2020-03-29 | Disposition: A | Discharge status: Home / Self Care | Payer: Managed Care, Other (non HMO) | Source: Ambulatory Visit | Attending: Surgery | Admitting: Surgery

## 2020-03-29 VITALS — BP 141/78 | HR 107 | Temp 98.0°F | Resp 20 | Ht 67.0 in | Wt 203.0 lb

## 2020-03-29 DIAGNOSIS — E785 Hyperlipidemia, unspecified: Secondary | ICD-10-CM | POA: Diagnosis not present

## 2020-03-29 DIAGNOSIS — F17200 Nicotine dependence, unspecified, uncomplicated: Secondary | ICD-10-CM | POA: Diagnosis not present

## 2020-03-29 DIAGNOSIS — E119 Type 2 diabetes mellitus without complications: Secondary | ICD-10-CM | POA: Insufficient documentation

## 2020-03-29 DIAGNOSIS — I6521 Occlusion and stenosis of right carotid artery: Secondary | ICD-10-CM

## 2020-03-29 DIAGNOSIS — I2510 Atherosclerotic heart disease of native coronary artery without angina pectoris: Secondary | ICD-10-CM | POA: Diagnosis not present

## 2020-03-29 DIAGNOSIS — I252 Old myocardial infarction: Secondary | ICD-10-CM | POA: Insufficient documentation

## 2020-03-29 DIAGNOSIS — I6523 Occlusion and stenosis of bilateral carotid arteries: Secondary | ICD-10-CM | POA: Insufficient documentation

## 2020-03-29 DIAGNOSIS — I10 Essential (primary) hypertension: Secondary | ICD-10-CM | POA: Insufficient documentation

## 2020-03-29 NOTE — Progress Notes (Signed)
[facility] [place]  Patient name: Erika [name] MRN: [id number] DOB: 1971/09/12 Sex: female  REASON FOR VISIT: Valuation of recurrent asymptomatic right carotid stenosis  HPI: Erika [name] is a 49 y.o. female ear today for evaluation.  She has a very premature onset of severe diffuse peripheral vascular and cardiac disease.  She underwent combined right carotid endarterectomy and coronary artery bypass grafting with Dr. Prescott Gum on 07/06/2016.  She did well and recovered uneventfully.  She had a carotid duplex most recently and February 2019 showing widely patent right carotid endarterectomy site.  She recently had follow-up duplex in [place] which suggested critical right carotid stenosis and is seen today for reevaluation.  She does not have any right brain symptoms.  She does report an episode of stiffness in her right hand and took 1 to 2 weeks to return to normal writing.  She has been stable from a cardiac standpoint.  She does have severe limiting bilateral lower extremity claudication and has had lower extremity intervention with stenting which has occluded.  Past Medical History:  Diagnosis Date  . Arthralgia of temporomandibular joint   . CAD, multiple vessel    a. cath 06/29/16: ostLM 40%, ostLAD 40%, pLAD 95%, ost-pLCx 60%, pLCx 95%, mLCx 60%, mRCA 95%, D2 50%, LVSF nl;  b. 07/2016 CABG x 4 (LIMA->LAD, VG->Diag, VG->OM, VG->RCA); c. 08/2016 Cath: 3VD w/ 4/4 patent grafts. LAD distal to LIMA has diff dzs->Med rx.  . Carotid arterial disease ([place])    a. 07/2016 s/p R CEA.  . Clotting disorder ([place])   . Depression   . Diastolic dysfunction    a. echo 06/28/16: EF 50-55%, mild inf wall HK, GR1DD, mild MR, RV sys fxn nl, mildly dilated LA, PASP nl  . Fatty liver disease, nonalcoholic 9381  . HLD (hyperlipidemia)   . Labile hypertension    a. prior renal ngiogram negative for RAS in 03/2016; b. catecholamines and  metanephrines normal, mildly elevated renin with normal aldosterone and normal ratio in 02/2016  . Obesity   . PTSD (post-traumatic stress disorder)   . Tobacco abuse    a. 2018 - cut back from 2 ppd to 0.5 ppd.  . Type 2 diabetes mellitus [facility]) January 2017    Family History  Adopted: Yes  Problem Relation Age of Onset  . Diabetes Mother   . Diabetes Father   . Alcohol abuse Father   . Heart disease Father   . Drug abuse Father   . Stroke Sister   . Anxiety disorder Sister     SOCIAL HISTORY: Social History   Tobacco Use  . Smoking status: Current Every Day Smoker    Packs/day: 0.50    Years: 27.00    Pack years: 13.50    Types: Cigarettes  . Smokeless tobacco: Never Used  . Tobacco comment: patient smokes when she drives.   Substance Use Topics  . Alcohol use: Yes    Alcohol/week: 0.0 standard drinks    Comment: socially    Allergies  Allergen Reactions  . Chantix [Varenicline Tartrate] Other (See Comments)    Feels "crazy"     Current Outpatient Medications  Medication Sig Dispense Refill  . amLODipine (NORVASC) 10 MG tablet Take 1 tablet (10 mg total) by mouth daily. 90 tablet 3  . aspirin EC 81 MG tablet Take 1 tablet (81 mg total) by mouth daily. 90 tablet 3  . benztropine (COGENTIN) 0.5 MG tablet Take 1 tablet (0.5  mg total) by mouth daily. 90 tablet 0  . carvedilol (COREG) 25 MG tablet Take 1 tablet (25 mg total) by mouth 2 (two) times daily with a meal. 60 tablet 11  . chlorproMAZINE (THORAZINE) 50 MG tablet Take 1.5 tablets (75 mg total) by mouth at bedtime. 135 tablet 0  . Cholecalciferol (VITAMIN D3) 125 MCG (5000 UT) CAPS Take 1 capsule (5,000 Units total) by mouth daily with breakfast. Take along with calcium and magnesium. 90 capsule 1  . clonazePAM (KLONOPIN) 0.5 MG tablet Take 1 tablet (0.5 mg total) by mouth 3 (three) times daily as needed for anxiety. 90 tablet 2  . cloNIDine (CATAPRES) 0.1 MG tablet Take 1 tablet (0.1 mg total) by mouth 2 (two)  times daily. 60 tablet 3  . clopidogrel (PLAVIX) 75 MG tablet TAKE 1 TABLET BY MOUTH EVERY DAY 30 tablet 3  . cyclobenzaprine (FEXMID) 7.5 MG tablet TAKE 1 TABLET BY MOUTH THREE TIMES A DAY AS NEEDED FOR MUSCLE SPASMS 90 tablet 11  . dapagliflozin propanediol (FARXIGA) 5 MG TABS tablet Take 5 mg by mouth daily before breakfast. 30 tablet 11  . Evolocumab (REPATHA SURECLICK) 354 MG/ML SOAJ Inject 1 pen into the skin every 14 (fourteen) days. 2 pen 11  . FLUoxetine HCl 60 MG TABS Take 60 mg by mouth daily. 90 tablet 0  . hydrALAZINE (APRESOLINE) 25 MG tablet Take 25 mg by mouth 3 (three) times daily.    . Insulin Glargine, 2 Unit Dial, (TOUJEO MAX SOLOSTAR) 300 UNIT/ML SOPN Inject 90 Units into the skin every morning. 4 pen 11  . insulin lispro (HUMALOG KWIKPEN) 100 UNIT/ML KwikPen Inject 0.03 mLs (3 Units total) into the skin 3 (three) times daily with meals. Take only if blood sugar is over 250.  Also pen needles 4/day 15 mL 11  . Insulin Syringe-Needle U-100 29G X 1/2" 0.3 ML MISC 1 each by Does not apply route 2 (two) times daily. 30 each 0  . isosorbide mononitrate (IMDUR) 60 MG 24 hr tablet Take 1 tablet (60 mg total) by mouth daily. 90 tablet 1  . lamoTRIgine (LAMICTAL) 200 MG tablet Take 1 tablet (200 mg total) by mouth daily. 90 tablet 0  . losartan (COZAAR) 100 MG tablet Take 1 tablet (100 mg total) by mouth daily. 30 tablet 11  . magic mouthwash SOLN Take 15 mLs by mouth 3 (three) times daily as needed for mouth pain. 250 mL 1  . Magnesium 500 MG CAPS Take 1 capsule (500 mg total) by mouth 2 (two) times daily at 8 am and 10 pm. 180 capsule 1  . naloxone (NARCAN) nasal spray 4 mg/0.1 mL 4 mg (contents of 1 nasal spray) as a single dose in one nostril; may repeat every 2 to 3 minutes in alternating nostrils until medical assistance becomes available 1 kit 0  . nicotine (NICODERM CQ) 14 mg/24hr patch Place 1 patch (14 mg total) onto the skin daily. 28 patch 11  . nitroGLYCERIN (NITROSTAT) 0.4  MG SL tablet Place 1 tablet (0.4 mg total) under the tongue every 5 (five) minutes as needed for chest pain. 25 tablet 98  . ondansetron (ZOFRAN) 4 MG tablet Take 1 tablet (4 mg total) by mouth daily as needed. 20 tablet 11  . oxyCODONE (OXY IR/ROXICODONE) 5 MG immediate release tablet Take 1 tablet (5 mg total) by mouth 2 (two) times daily as needed for severe pain. Must last 30 days. 60 tablet 0  . [START ON 04/27/2020] oxyCODONE (OXY  IR/ROXICODONE) 5 MG immediate release tablet Take 1 tablet (5 mg total) by mouth 2 (two) times daily as needed for severe pain. Must last 30 days. 60 tablet 0  . [START ON 05/27/2020] oxyCODONE (OXY IR/ROXICODONE) 5 MG immediate release tablet Take 1 tablet (5 mg total) by mouth 2 (two) times daily as needed for severe pain. Must last 30 days. 60 tablet 0  . pregabalin (LYRICA) 225 MG capsule Take 1 capsule (225 mg total) by mouth 3 (three) times daily. 90 capsule 5  . rosuvastatin (CRESTOR) 10 MG tablet Take 1 tablet (10 mg total) by mouth daily. 90 tablet 3  . spironolactone (ALDACTONE) 25 MG tablet Take 1 tablet (25 mg total) by mouth daily. 90 tablet 3  . albuterol (PROVENTIL HFA;VENTOLIN HFA) 108 (90 Base) MCG/ACT inhaler Inhale 2 puffs into the lungs every 6 (six) hours as needed for wheezing. 1 Inhaler 0  . oxyCODONE (OXY IR/ROXICODONE) 5 MG immediate release tablet Take 1 tablet (5 mg total) by mouth 2 (two) times daily as needed for severe pain. Must last 30 days. 60 tablet 0   No current facility-administered medications for this visit.    REVIEW OF SYSTEMS:  '[X]'$  denotes positive finding, '[ ]'$  denotes negative finding Cardiac  Comments:  Chest pain or chest pressure:    Shortness of breath upon exertion:    Short of breath when lying flat:    Irregular heart rhythm:        Vascular    Pain in calf, thigh, or hip brought on by ambulation: x   Pain in feet at night that wakes you up from your sleep:     Blood clot in your veins:    Leg swelling:             PHYSICAL EXAM: Vitals:   03/29/20 1541 03/29/20 1543  BP: (!) 160/96 (!) 141/78  Pulse: (!) 107   Resp: 20   Temp: 98 F (36.7 C)   SpO2: 98%   Weight: 203 lb (92.1 kg)   Height: '5\' 7"'$  (1.702 m)     GENERAL: The patient is a well-nourished female, in no acute distress. The vital signs are documented above. CARDIOVASCULAR: Bilateral carotid bruits.  Well-healed right carotid incision PULMONARY: There is good air exchange  MUSCULOSKELETAL: There are no major deformities or cyanosis. NEUROLOGIC: No focal weakness or paresthesias are detected. SKIN: There are no ulcers or rashes noted. PSYCHIATRIC: The patient has a normal affect.  DATA:  Repeat right carotid duplex in our office today confirms critical stenosis with markedly elevated diastolic and end-diastolic velocities  MEDICAL ISSUES: Had long discussion with the patient.  I explained this is highly unusual to have an Dawnita Molner recurrence of her stenosis.  She did have a normal duplex which went essentially rule out technical difficulty.  I had a very long discussion with the patient regarding need for CT scan for further evaluation of her stenosis and would make recommendation based on this.  I explained that in all likelihood this will confirm critical stenosis.  I discussed option of redo carotid endarterectomy versus carotid stenting.  I certainly would feel that this would not be indicated for stenting and that she has some unusual anatomic issue that would make a redo endarterectomy more risky.  We will discuss this with her further following the CT scan    Rosetta Posner, MD [facility] Tel [phone] Pager [phone]

## 2020-03-30 ENCOUNTER — Other Ambulatory Visit: Payer: Self-pay

## 2020-03-30 DIAGNOSIS — I6523 Occlusion and stenosis of bilateral carotid arteries: Secondary | ICD-10-CM

## 2020-04-06 ENCOUNTER — Ambulatory Visit: Admission: RE | Admit: 2020-04-06 | Payer: Managed Care, Other (non HMO) | Source: Ambulatory Visit

## 2020-04-07 ENCOUNTER — Other Ambulatory Visit (HOSPITAL_COMMUNITY): Payer: Self-pay | Admitting: Psychiatry

## 2020-04-07 ENCOUNTER — Ambulatory Visit: Payer: Managed Care, Other (non HMO) | Admitting: Endocrinology

## 2020-04-07 DIAGNOSIS — F4310 Post-traumatic stress disorder, unspecified: Secondary | ICD-10-CM

## 2020-04-08 ENCOUNTER — Other Ambulatory Visit: Payer: Self-pay

## 2020-04-08 ENCOUNTER — Ambulatory Visit
Admission: RE | Admit: 2020-04-08 | Discharge: 2020-04-08 | Disposition: A | Discharge status: Home / Self Care | Payer: Managed Care, Other (non HMO) | Source: Ambulatory Visit | Attending: Vascular Surgery | Admitting: Vascular Surgery

## 2020-04-08 DIAGNOSIS — I6523 Occlusion and stenosis of bilateral carotid arteries: Secondary | ICD-10-CM | POA: Diagnosis not present

## 2020-04-08 LAB — POCT I-STAT CREATININE: Creatinine, Ser: 0.7 mg/dL (ref 0.44–1.00)

## 2020-04-08 MED ORDER — IOHEXOL 300 MG/ML  SOLN
75.0000 mL | Freq: Once | INTRAMUSCULAR | Status: AC | PRN
  Administered 2020-04-08: 75 mL via INTRAVENOUS

## 2020-04-11 ENCOUNTER — Encounter: Payer: Self-pay | Admitting: Endocrinology

## 2020-04-11 ENCOUNTER — Other Ambulatory Visit: Payer: Self-pay

## 2020-04-11 ENCOUNTER — Ambulatory Visit (INDEPENDENT_AMBULATORY_CARE_PROVIDER_SITE_OTHER): Payer: Managed Care, Other (non HMO) | Admitting: Endocrinology

## 2020-04-11 ENCOUNTER — Other Ambulatory Visit: Payer: Self-pay | Admitting: *Deleted

## 2020-04-11 VITALS — BP 160/100 | HR 121 | Ht 67.0 in | Wt 193.2 lb

## 2020-04-11 DIAGNOSIS — E0865 Diabetes mellitus due to underlying condition with hyperglycemia: Secondary | ICD-10-CM

## 2020-04-11 DIAGNOSIS — Z794 Long term (current) use of insulin: Secondary | ICD-10-CM | POA: Diagnosis not present

## 2020-04-11 DIAGNOSIS — R110 Nausea: Secondary | ICD-10-CM | POA: Diagnosis not present

## 2020-04-11 DIAGNOSIS — I10 Essential (primary) hypertension: Secondary | ICD-10-CM

## 2020-04-11 DIAGNOSIS — R000 Tachycardia, unspecified: Secondary | ICD-10-CM | POA: Diagnosis not present

## 2020-04-11 DIAGNOSIS — E1159 Type 2 diabetes mellitus with other circulatory complications: Secondary | ICD-10-CM

## 2020-04-11 DIAGNOSIS — E0859 Diabetes mellitus due to underlying condition with other circulatory complications: Secondary | ICD-10-CM

## 2020-04-11 DIAGNOSIS — IMO0002 Reserved for concepts with insufficient information to code with codable children: Secondary | ICD-10-CM

## 2020-04-11 LAB — POCT GLYCOSYLATED HEMOGLOBIN (HGB A1C): Hemoglobin A1C: 9.5 % — AB (ref 4.0–5.6)

## 2020-04-11 MED ORDER — TOUJEO MAX SOLOSTAR 300 UNIT/ML ~~LOC~~ SOPN: 110.0000 [IU] | PEN_INJECTOR | SUBCUTANEOUS | 11 refills | Status: DC

## 2020-04-11 NOTE — Telephone Encounter (Signed)
Per a 12/16/19 phone note from Ignacia Bayley, NP the patient could not tolerate hydralazine previously so we did not refill at that time.  She saw Laurann Montana, NP on 02/14/20 and I do not see hydralazine on her medication list or that it was restarted at that visit.  She saw Dr. Donnetta Hutching on 03/29/20 and hydralazine 25 mg TID is on her medication list.  I wonder if this was restarted by Dr. Donnetta Hutching.  Can you please call the patient to confirm who/ when this was restarted?   Thank you!

## 2020-04-11 NOTE — Telephone Encounter (Signed)
I spoke to pt and she mentioned that she is taking Hydralazine 25 mg tablet tid.

## 2020-04-11 NOTE — Progress Notes (Signed)
Subjective:    Patient ID: Erika [name], female    DOB: Jul 08, 1971, 49 y.o.   MRN: [id number]  HPI Pt returns for f/u of diabetes mellitus:  DM type: Insulin-requiring type 2 Dx'ed: 9562 Complications: CAD and PAD.  Therapy: insulin since 2017, and Farxiga.   GDM: never DKA: never Severe hypoglycemia: never Pancreatitis: never Pancreatic imaging: normal on 2010 CT SDOH: due to noncompliance with insulin, she is not a candidate for multiple daily injections.   Other: she is not a candidate for GLP med, due to chronic nausea.   Interval history: no cbg record, but states cbg's vary from 150-300's.  Pt says she takes insulin as rx'ed.  pt states she feels well in general.  She averages approx 5 total units of humalog per day.   Past Medical History:  Diagnosis Date  . Arthralgia of temporomandibular joint   . CAD, multiple vessel    a. cath 06/29/16: ostLM 40%, ostLAD 40%, pLAD 95%, ost-pLCx 60%, pLCx 95%, mLCx 60%, mRCA 95%, D2 50%, LVSF nl;  b. 07/2016 CABG x 4 (LIMA->LAD, VG->Diag, VG->OM, VG->RCA); c. 08/2016 Cath: 3VD w/ 4/4 patent grafts. LAD distal to LIMA has diff dzs->Med rx.  . Carotid arterial disease ([place])    a. 07/2016 s/p R CEA.  . Clotting disorder ([place])   . Depression   . Diastolic dysfunction    a. echo 06/28/16: EF 50-55%, mild inf wall HK, GR1DD, mild MR, RV sys fxn nl, mildly dilated LA, PASP nl  . Fatty liver disease, nonalcoholic 1308  . HLD (hyperlipidemia)   . Labile hypertension    a. prior renal ngiogram negative for RAS in 03/2016; b. catecholamines and metanephrines normal, mildly elevated renin with normal aldosterone and normal ratio in 02/2016  . Obesity   . PTSD (post-traumatic stress disorder)   . Tobacco abuse    a. 2018 - cut back from 2 ppd to 0.5 ppd.  . Type 2 diabetes mellitus [facility]) January 2017    Past Surgical History:  Procedure Laterality Date  . ABDOMINAL AORTOGRAM W/LOWER EXTREMITY [name] 10/15/2018   Procedure: ABDOMINAL AORTOGRAM  W/LOWER EXTREMITY;  Surgeon: Wellington Hampshire, MD;  Location: [place] [facility];  Service: Cardiovascular;  Laterality: [name];  . ABDOMINAL AORTOGRAM W/LOWER EXTREMITY Bilateral 08/19/2019   Procedure: ABDOMINAL AORTOGRAM W/LOWER EXTREMITY;  Surgeon: Wellington Hampshire, MD;  Location: [place] [facility];  Service: Cardiovascular;  Laterality: Bilateral;  . CARDIAC CATHETERIZATION [name] 06/29/2016   Procedure: Left Heart Cath and Coronary Angiography;  Surgeon: Minna Merritts, MD;  Location: [place] [facility];  Service: Cardiovascular;  Laterality: [name];  . CARDIAC CATHETERIZATION [name] 08/29/2016   Procedure: Left Heart Cath and Cors/Grafts Angiography;  Surgeon: Wellington Hampshire, MD;  Location: [place] [facility];  Service: Cardiovascular;  Laterality: [name];  . CESAREAN SECTION    . CHOLECYSTECTOMY    . CORONARY ARTERY BYPASS GRAFT [name] 07/06/2016   Procedure: CORONARY ARTERY BYPASS GRAFTING (CABG) x four, using left internal mammary artery and right leg greater saphenous vein harvested endoscopically;  Surgeon: Ivin Poot, MD;  Location: [place];  Service: Open Heart Surgery;  Laterality: [name];  . ENDARTERECTOMY Right 07/06/2016   Procedure: ENDARTERECTOMY CAROTID;  Surgeon: Rosetta Posner, MD;  Location: [place];  Service: Vascular;  Laterality: Right;  . PERIPHERAL VASCULAR CATHETERIZATION [name] 04/18/2016   Procedure: Renal Angiography;  Surgeon: Wellington Hampshire, MD;  Location: [place] [facility];  Service: Cardiovascular;  Laterality: [name];  .  PERIPHERAL VASCULAR INTERVENTION Left 10/15/2018   Procedure: PERIPHERAL VASCULAR INTERVENTION;  Surgeon: Wellington Hampshire, MD;  Location: [place] [facility];  Service: Cardiovascular;  Laterality: Left;  Left superficial femoral  . TEE WITHOUT CARDIOVERSION [name] 07/06/2016   Procedure: TRANSESOPHAGEAL ECHOCARDIOGRAM (TEE);  Surgeon: Ivin Poot, MD;  Location: [place];  Service: Open Heart Surgery;  Laterality: [name];  . TONSILLECTOMY      Social History    Socioeconomic History  . Marital status: Married    Spouse name: Not on file  . Number of children: 1  . Years of education: 72  . Highest education level: Not on file  Occupational History  . Occupation: Disability  Tobacco Use  . Smoking status: Current Every Day Smoker    Packs/day: 0.50    Years: 27.00    Pack years: 13.50    Types: Cigarettes  . Smokeless tobacco: Never Used  . Tobacco comment: patient smokes when she drives.   Substance and Sexual Activity  . Alcohol use: Yes    Alcohol/week: 0.0 standard drinks    Comment: socially  . Drug use: No  . Sexual activity: Yes    Partners: Male    Birth control/protection: None  Other Topics Concern  . Not on file  Social History Narrative   Lives with husband   Caffeine use: Drinks 2 cups coffee per day   Right handed   Social Determinants of Health   Financial Resource Strain:   . Difficulty of Paying Living Expenses:   Food Insecurity:   . Worried About Charity fundraiser in the Last Year:   . Arboriculturist in the Last Year:   Transportation Needs:   . Film/video editor (Medical):   Marland Kitchen Lack of Transportation (Non-Medical):   Physical Activity:   . Days of Exercise per Week:   . Minutes of Exercise per Session:   Stress:   . Feeling of Stress :   Social Connections:   . Frequency of Communication with Friends and Family:   . Frequency of Social Gatherings with Friends and Family:   . Attends Religious Services:   . Active Member of Clubs or Organizations:   . Attends Archivist Meetings:   Marland Kitchen Marital Status:   Intimate Partner Violence:   . Fear of Current or Ex-Partner:   . Emotionally Abused:   Marland Kitchen Physically Abused:   . Sexually Abused:     Current Outpatient Medications on File Prior to Visit  Medication Sig Dispense Refill  . amLODipine (NORVASC) 10 MG tablet Take 1 tablet (10 mg total) by mouth daily. 90 tablet 3  . aspirin EC 81 MG tablet Take 1 tablet (81 mg total) by mouth  daily. 90 tablet 3  . benztropine (COGENTIN) 0.5 MG tablet Take 1 tablet (0.5 mg total) by mouth daily. 90 tablet 0  . carvedilol (COREG) 25 MG tablet Take 1 tablet (25 mg total) by mouth 2 (two) times daily with a meal. 60 tablet 11  . chlorproMAZINE (THORAZINE) 50 MG tablet Take 1.5 tablets (75 mg total) by mouth at bedtime. 135 tablet 0  . Cholecalciferol (VITAMIN D3) 125 MCG (5000 UT) CAPS Take 1 capsule (5,000 Units total) by mouth daily with breakfast. Take along with calcium and magnesium. 90 capsule 1  . clonazePAM (KLONOPIN) 0.5 MG tablet Take 1 tablet (0.5 mg total) by mouth 3 (three) times daily as needed for anxiety. 90 tablet 2  . cloNIDine (CATAPRES) 0.1 MG  tablet Take 1 tablet (0.1 mg total) by mouth 2 (two) times daily. 60 tablet 3  . clopidogrel (PLAVIX) 75 MG tablet TAKE 1 TABLET BY MOUTH EVERY DAY 30 tablet 3  . cyclobenzaprine (FEXMID) 7.5 MG tablet TAKE 1 TABLET BY MOUTH THREE TIMES A DAY AS NEEDED FOR MUSCLE SPASMS 90 tablet 11  . dapagliflozin propanediol (FARXIGA) 5 MG TABS tablet Take 5 mg by mouth daily before breakfast. 30 tablet 11  . Evolocumab (REPATHA SURECLICK) 725 MG/ML SOAJ Inject 1 pen into the skin every 14 (fourteen) days. 2 pen 11  . FLUoxetine HCl 60 MG TABS Take 60 mg by mouth daily. 90 tablet 0  . insulin lispro (HUMALOG KWIKPEN) 100 UNIT/ML KwikPen Inject 0.03 mLs (3 Units total) into the skin 3 (three) times daily with meals. Take only if blood sugar is over 250.  Also pen needles 4/day 15 mL 11  . Insulin Syringe-Needle U-100 29G X 1/2" 0.3 ML MISC 1 each by Does not apply route 2 (two) times daily. 30 each 0  . isosorbide mononitrate (IMDUR) 60 MG 24 hr tablet Take 1 tablet (60 mg total) by mouth daily. 90 tablet 1  . lamoTRIgine (LAMICTAL) 200 MG tablet Take 1 tablet (200 mg total) by mouth daily. 90 tablet 0  . losartan (COZAAR) 100 MG tablet Take 1 tablet (100 mg total) by mouth daily. 30 tablet 11  . magic mouthwash SOLN Take 15 mLs by mouth 3 (three)  times daily as needed for mouth pain. 250 mL 1  . Magnesium 500 MG CAPS Take 1 capsule (500 mg total) by mouth 2 (two) times daily at 8 am and 10 pm. 180 capsule 1  . naloxone (NARCAN) nasal spray 4 mg/0.1 mL 4 mg (contents of 1 nasal spray) as a single dose in one nostril; may repeat every 2 to 3 minutes in alternating nostrils until medical assistance becomes available 1 kit 0  . nicotine (NICODERM CQ) 14 mg/24hr patch Place 1 patch (14 mg total) onto the skin daily. 28 patch 11  . nitroGLYCERIN (NITROSTAT) 0.4 MG SL tablet Place 1 tablet (0.4 mg total) under the tongue every 5 (five) minutes as needed for chest pain. 25 tablet 98  . ondansetron (ZOFRAN) 4 MG tablet Take 1 tablet (4 mg total) by mouth daily as needed. 20 tablet 11  . oxyCODONE (OXY IR/ROXICODONE) 5 MG immediate release tablet Take 1 tablet (5 mg total) by mouth 2 (two) times daily as needed for severe pain. Must last 30 days. 60 tablet 0  . [START ON 04/27/2020] oxyCODONE (OXY IR/ROXICODONE) 5 MG immediate release tablet Take 1 tablet (5 mg total) by mouth 2 (two) times daily as needed for severe pain. Must last 30 days. 60 tablet 0  . [START ON 05/27/2020] oxyCODONE (OXY IR/ROXICODONE) 5 MG immediate release tablet Take 1 tablet (5 mg total) by mouth 2 (two) times daily as needed for severe pain. Must last 30 days. 60 tablet 0  . pregabalin (LYRICA) 225 MG capsule Take 1 capsule (225 mg total) by mouth 3 (three) times daily. 90 capsule 5  . rosuvastatin (CRESTOR) 10 MG tablet Take 1 tablet (10 mg total) by mouth daily. 90 tablet 3  . spironolactone (ALDACTONE) 25 MG tablet Take 1 tablet (25 mg total) by mouth daily. 90 tablet 3  . albuterol (PROVENTIL HFA;VENTOLIN HFA) 108 (90 Base) MCG/ACT inhaler Inhale 2 puffs into the lungs every 6 (six) hours as needed for wheezing. 1 Inhaler 0  . oxyCODONE (  OXY IR/ROXICODONE) 5 MG immediate release tablet Take 1 tablet (5 mg total) by mouth 2 (two) times daily as needed for severe pain. Must last  30 days. 60 tablet 0   No current facility-administered medications on file prior to visit.    Allergies  Allergen Reactions  . Chantix [Varenicline Tartrate] Other (See Comments)    Feels "crazy"     Family History  Adopted: Yes  Problem Relation Age of Onset  . Diabetes Mother   . Diabetes Father   . Alcohol abuse Father   . Heart disease Father   . Drug abuse Father   . Stroke Sister   . Anxiety disorder Sister     BP (!) 160/100   Pulse (!) 121   Ht '5\' 7"'$  (1.702 m)   Wt 193 lb 3.2 oz (87.6 kg)   LMP 03/28/2020 (Approximate)   SpO2 93%   BMI 30.26 kg/m    Review of Systems She denies hypoglycemia.    Objective:   Physical Exam VITAL SIGNS:  See vs page GENERAL: no distress Pulses: dorsalis pedis intact bilat.   MSK: no deformity of the feet CV: no leg edema Skin:  no ulcer on the feet.  normal color and temp on the feet. Neuro: sensation is intact to touch on the feet, but decreased from normal  Lab Results  Component Value Date   HGBA1C 9.5 (A) 04/11/2020   Lab Results  Component Value Date   CREATININE 0.70 04/08/2020   BUN 9 09/07/2019   NA 137 09/07/2019   K 3.8 09/07/2019   CL 103 09/07/2019   CO2 26 09/07/2019   Lab Results  Component Value Date   TSH 1.693 09/15/2019        Assessment & Plan:  HTN and tachycardia are noted today.   Insulin-requiring type 2 DM: she needs increased rx.   SDOH: due to noncompliance with insulin, she is not a candidate for multiple daily injections.  goal is to phase out the humalog.   Nausea, chronic: This limits rx options  Patient Instructions  Your blood pressure and heart rate are high today.  Please see your primary care provider soon, to have these rechecked Please increase the Toujeo to 110 units each morning, and: Please continue the same other diabetes medications Please come back for a follow-up appointment in 2 months.  check your blood sugar twice a day.  vary the time of day when you  check, between before the 3 meals, and at bedtime.  also check if you have symptoms of your blood sugar being too high or too low.  please keep a record of the readings and bring it to your next appointment here (or you can bring the meter itself).  You can write it on any piece of paper.  please call us sooner if your blood sugar goes below 70, or if you have a lot of readings over 200.

## 2020-04-11 NOTE — Patient Instructions (Addendum)
Your blood pressure and heart rate are high today.  Please see your primary care provider soon, to have these rechecked Please increase the Toujeo to 110 units each morning, and: Please continue the same other diabetes medications Please come back for a follow-up appointment in 2 months.  check your blood sugar twice a day.  vary the time of day when you check, between before the 3 meals, and at bedtime.  also check if you have symptoms of your blood sugar being too high or too low.  please keep a record of the readings and bring it to your next appointment here (or you can bring the meter itself).  You can write it on any piece of paper.  please call us sooner if your blood sugar goes below 70, or if you have a lot of readings over 200.

## 2020-04-11 NOTE — Telephone Encounter (Signed)
I returned pt a call to verify/confirm Hydralazine due to it not being on her last med list. Pt couldn't tell me when the medication was started and went through her medications and said that she is not taking Hydralazine and that she apologizes for the confusion.  She isn't taking the medication and mentioned that her pharmacy had hydralazine on auto refill.  Please disregard refill request pt verified and confirmed she isn't taking medication.

## 2020-04-12 ENCOUNTER — Other Ambulatory Visit: Payer: Self-pay

## 2020-04-12 ENCOUNTER — Ambulatory Visit (INDEPENDENT_AMBULATORY_CARE_PROVIDER_SITE_OTHER): Payer: Managed Care, Other (non HMO) | Admitting: Vascular Surgery

## 2020-04-12 ENCOUNTER — Encounter: Payer: Self-pay | Admitting: Vascular Surgery

## 2020-04-12 VITALS — BP 163/100

## 2020-04-12 DIAGNOSIS — I6523 Occlusion and stenosis of bilateral carotid arteries: Secondary | ICD-10-CM

## 2020-04-12 NOTE — H&P (View-Only) (Signed)
Virtual Visit via Telephone Note   I connected with Erika [name] on 04/12/2020  by telephone and verified that I was speaking with the correct person using two identifiers. Patient was located at home and accompanied by no one. I am located at [facility].   The limitations of evaluation and management by telemedicine and the availability of in person appointments have been previously discussed with the patient and are documented in the patients chart. The patient expressed understanding and consented to proceed.  PCP: Patient, No Pcp Per   Chief Complaint: Follow-up of CT scan with duplex suggesting high-grade carotid stenosis  History of Present Illness: Erika [name] is a 49 y.o. female with discussing as recent CT scan.  I have seen her several weeks ago with a duplex suggesting high-grade restenosis of her right carotid endarterectomy.  She had this endarterectomy in conjunction with coronary bypass grafting in August 2017.  She had had a subsequent duplex showing widely patent endarterectomy site.  Past Medical History:  Diagnosis Date  . Arthralgia of temporomandibular joint   . CAD, multiple vessel    a. cath 06/29/16: ostLM 40%, ostLAD 40%, pLAD 95%, ost-pLCx 60%, pLCx 95%, mLCx 60%, mRCA 95%, D2 50%, LVSF nl;  b. 07/2016 CABG x 4 (LIMA->LAD, VG->Diag, VG->OM, VG->RCA); c. 08/2016 Cath: 3VD w/ 4/4 patent grafts. LAD distal to LIMA has diff dzs->Med rx.  . Carotid arterial disease ([place])    a. 07/2016 s/p R CEA.  . Clotting disorder ([place])   . Depression   . Diastolic dysfunction    a. echo 06/28/16: EF 50-55%, mild inf wall HK, GR1DD, mild MR, RV sys fxn nl, mildly dilated LA, PASP nl  . Fatty liver disease, nonalcoholic [id number]  . HLD (hyperlipidemia)   . Labile hypertension    a. prior renal ngiogram negative for RAS in 03/2016; b. catecholamines and metanephrines normal, mildly elevated renin with normal aldosterone and normal ratio in 02/2016  . Obesity   . PTSD  (post-traumatic stress disorder)   . Tobacco abuse    a. 2018 - cut back from 2 ppd to 0.5 ppd.  . Type 2 diabetes mellitus [facility]) January 2017    Past Surgical History:  Procedure Laterality Date  . ABDOMINAL AORTOGRAM W/LOWER EXTREMITY [name] 10/15/2018   Procedure: ABDOMINAL AORTOGRAM W/LOWER EXTREMITY;  Surgeon: Wellington Hampshire, MD;  Location: [place] [facility];  Service: Cardiovascular;  Laterality: [name];  . ABDOMINAL AORTOGRAM W/LOWER EXTREMITY Bilateral 08/19/2019   Procedure: ABDOMINAL AORTOGRAM W/LOWER EXTREMITY;  Surgeon: Wellington Hampshire, MD;  Location: [place] [facility];  Service: Cardiovascular;  Laterality: Bilateral;  . CARDIAC CATHETERIZATION [name] 06/29/2016   Procedure: Left Heart Cath and Coronary Angiography;  Surgeon: Minna Merritts, MD;  Location: [place] [facility];  Service: Cardiovascular;  Laterality: [name];  . CARDIAC CATHETERIZATION [name] 08/29/2016   Procedure: Left Heart Cath and Cors/Grafts Angiography;  Surgeon: Wellington Hampshire, MD;  Location: [place] [facility];  Service: Cardiovascular;  Laterality: [name];  . CESAREAN SECTION    . CHOLECYSTECTOMY    . CORONARY ARTERY BYPASS GRAFT [name] 07/06/2016   Procedure: CORONARY ARTERY BYPASS GRAFTING (CABG) x four, using left internal mammary artery and right leg greater saphenous vein harvested endoscopically;  Surgeon: Ivin Poot, MD;  Location: [place];  Service: Open Heart Surgery;  Laterality: [name];  . ENDARTERECTOMY Right 07/06/2016   Procedure: ENDARTERECTOMY CAROTID;  Surgeon: Rosetta Posner, MD;  Location: [place];  Service:  Vascular;  Laterality: Right;  . PERIPHERAL VASCULAR CATHETERIZATION [name] 04/18/2016   Procedure: Renal Angiography;  Surgeon: Wellington Hampshire, MD;  Location: [place] [facility];  Service: Cardiovascular;  Laterality: [name];  . PERIPHERAL VASCULAR INTERVENTION Left 10/15/2018   Procedure: PERIPHERAL VASCULAR INTERVENTION;  Surgeon: Wellington Hampshire, MD;  Location: [place] [facility];  Service:  Cardiovascular;  Laterality: Left;  Left superficial femoral  . TEE WITHOUT CARDIOVERSION [name] 07/06/2016   Procedure: TRANSESOPHAGEAL ECHOCARDIOGRAM (TEE);  Surgeon: Ivin Poot, MD;  Location: [place];  Service: Open Heart Surgery;  Laterality: [name];  . TONSILLECTOMY      Current Meds  Medication Sig  . amLODipine (NORVASC) 10 MG tablet Take 1 tablet (10 mg total) by mouth daily.  Marland Kitchen aspirin EC 81 MG tablet Take 1 tablet (81 mg total) by mouth daily.  . benztropine (COGENTIN) 0.5 MG tablet Take 1 tablet (0.5 mg total) by mouth daily.  . carvedilol (COREG) 25 MG tablet Take 1 tablet (25 mg total) by mouth 2 (two) times daily with a meal.  . chlorproMAZINE (THORAZINE) 50 MG tablet Take 1.5 tablets (75 mg total) by mouth at bedtime.  . Cholecalciferol (VITAMIN D3) 125 MCG (5000 UT) CAPS Take 1 capsule (5,000 Units total) by mouth daily with breakfast. Take along with calcium and magnesium.  . clonazePAM (KLONOPIN) 0.5 MG tablet Take 1 tablet (0.5 mg total) by mouth 3 (three) times daily as needed for anxiety.  . cloNIDine (CATAPRES) 0.1 MG tablet Take 1 tablet (0.1 mg total) by mouth 2 (two) times daily.  . clopidogrel (PLAVIX) 75 MG tablet TAKE 1 TABLET BY MOUTH EVERY DAY  . cyclobenzaprine (FEXMID) 7.5 MG tablet TAKE 1 TABLET BY MOUTH THREE TIMES A DAY AS NEEDED FOR MUSCLE SPASMS  . dapagliflozin propanediol (FARXIGA) 5 MG TABS tablet Take 5 mg by mouth daily before breakfast.  . Evolocumab (REPATHA SURECLICK) XX123456 MG/ML SOAJ Inject 1 pen into the skin every 14 (fourteen) days.  Marland Kitchen FLUoxetine HCl 60 MG TABS Take 60 mg by mouth daily.  . insulin glargine, 2 Unit Dial, (TOUJEO MAX SOLOSTAR) 300 UNIT/ML Solostar Pen Inject 110 Units into the skin every morning.  . insulin lispro (HUMALOG KWIKPEN) 100 UNIT/ML KwikPen Inject 0.03 mLs (3 Units total) into the skin 3 (three) times daily with meals. Take only if blood sugar is over 250.  Also pen needles 4/day  . Insulin Syringe-Needle U-100 29G X 1/2"  0.3 ML MISC 1 each by Does not apply route 2 (two) times daily.  . isosorbide mononitrate (IMDUR) 60 MG 24 hr tablet Take 1 tablet (60 mg total) by mouth daily.  Marland Kitchen lamoTRIgine (LAMICTAL) 200 MG tablet Take 1 tablet (200 mg total) by mouth daily.  Marland Kitchen losartan (COZAAR) 100 MG tablet Take 1 tablet (100 mg total) by mouth daily.  . magic mouthwash SOLN Take 15 mLs by mouth 3 (three) times daily as needed for mouth pain.  . Magnesium 500 MG CAPS Take 1 capsule (500 mg total) by mouth 2 (two) times daily at 8 am and 10 pm.  . naloxone (NARCAN) nasal spray 4 mg/0.1 mL 4 mg (contents of 1 nasal spray) as a single dose in one nostril; may repeat every 2 to 3 minutes in alternating nostrils until medical assistance becomes available  . nicotine (NICODERM CQ) 14 mg/24hr patch Place 1 patch (14 mg total) onto the skin daily.  . nitroGLYCERIN (NITROSTAT) 0.4 MG SL tablet Place 1 tablet (0.4 mg total) under  the tongue every 5 (five) minutes as needed for chest pain.  Marland Kitchen ondansetron (ZOFRAN) 4 MG tablet Take 1 tablet (4 mg total) by mouth daily as needed.  Marland Kitchen oxyCODONE (OXY IR/ROXICODONE) 5 MG immediate release tablet Take 1 tablet (5 mg total) by mouth 2 (two) times daily as needed for severe pain. Must last 30 days.  Derrill Memo ON 04/27/2020] oxyCODONE (OXY IR/ROXICODONE) 5 MG immediate release tablet Take 1 tablet (5 mg total) by mouth 2 (two) times daily as needed for severe pain. Must last 30 days.  Derrill Memo ON 05/27/2020] oxyCODONE (OXY IR/ROXICODONE) 5 MG immediate release tablet Take 1 tablet (5 mg total) by mouth 2 (two) times daily as needed for severe pain. Must last 30 days.  . pregabalin (LYRICA) 225 MG capsule Take 1 capsule (225 mg total) by mouth 3 (three) times daily.  . rosuvastatin (CRESTOR) 10 MG tablet Take 1 tablet (10 mg total) by mouth daily.  Marland Kitchen spironolactone (ALDACTONE) 25 MG tablet Take 1 tablet (25 mg total) by mouth daily.    12 system ROS was negative unless otherwise noted in HPI    Observations/Objective: CT scan shows critical stenosis in her right endarterectomy site.  Internal carotid above and common carotid below are normal.  She has no calcification in this restenosis and is either mural thrombus or intimal hyperplasia  Assessment and Plan: I had had extensive discussion with the patient while she was in the office several weeks ago prior to the CT scan.  I have recommended redo carotid endarterectomy for reduction of stroke risk.  She has a wedding on May 1 and I have suggested that this be deferred for 2 weeks until she is had this important event in her life.  I did discuss potential complications  We also discussed her limiting claudication.  She reports that she has very short walking distance ability.  I explained that her young age and with prior failed endovascular treatments elsewhere that I would certainly recommend very conservative approach.  Explained that young women typically have high risk of recurrence and that would put her at higher risk for limb loss over time than conservative treatment alone.  Good   I discussed the assessment and treatment plan with the patient. The patient was provided an opportunity to ask questions and all were answered. The patient agreed with the plan and demonstrated an understanding of the instructions.   The patient was advised to call back or seek an in-person evaluation if the symptoms worsen or if the condition fails to improve as anticipated.  I spent 11-15 minutes with the patient via telephone encounter.   Annamary Rummage [facility] Office: [phone]  04/12/2020, 8:51 AM

## 2020-04-12 NOTE — Progress Notes (Signed)
Virtual Visit via Telephone Note   I connected with Erika [name] on 04/12/2020  by telephone and verified that I was speaking with the correct person using two identifiers. Patient was located at home and accompanied by no one. I am located at [facility].   The limitations of evaluation and management by telemedicine and the availability of in person appointments have been previously discussed with the patient and are documented in the patients chart. The patient expressed understanding and consented to proceed.  PCP: Patient, No Pcp Per   Chief Complaint: Follow-up of CT scan with duplex suggesting high-grade carotid stenosis  History of Present Illness: Erika [name] is a 49 y.o. female with discussing as recent CT scan.  I have seen her several weeks ago with a duplex suggesting high-grade restenosis of her right carotid endarterectomy.  She had this endarterectomy in conjunction with coronary bypass grafting in August 2017.  She had had a subsequent duplex showing widely patent endarterectomy site.  Past Medical History:  Diagnosis Date  . Arthralgia of temporomandibular joint   . CAD, multiple vessel    a. cath 06/29/16: ostLM 40%, ostLAD 40%, pLAD 95%, ost-pLCx 60%, pLCx 95%, mLCx 60%, mRCA 95%, D2 50%, LVSF nl;  b. 07/2016 CABG x 4 (LIMA->LAD, VG->Diag, VG->OM, VG->RCA); c. 08/2016 Cath: 3VD w/ 4/4 patent grafts. LAD distal to LIMA has diff dzs->Med rx.  . Carotid arterial disease ([place])    a. 07/2016 s/p R CEA.  . Clotting disorder ([place])   . Depression   . Diastolic dysfunction    a. echo 06/28/16: EF 50-55%, mild inf wall HK, GR1DD, mild MR, RV sys fxn nl, mildly dilated LA, PASP nl  . Fatty liver disease, nonalcoholic [id number]  . HLD (hyperlipidemia)   . Labile hypertension    a. prior renal ngiogram negative for RAS in 03/2016; b. catecholamines and metanephrines normal, mildly elevated renin with normal aldosterone and normal ratio in 02/2016  . Obesity   . PTSD  (post-traumatic stress disorder)   . Tobacco abuse    a. 2018 - cut back from 2 ppd to 0.5 ppd.  . Type 2 diabetes mellitus [facility]) January 2017    Past Surgical History:  Procedure Laterality Date  . ABDOMINAL AORTOGRAM W/LOWER EXTREMITY [name] 10/15/2018   Procedure: ABDOMINAL AORTOGRAM W/LOWER EXTREMITY;  Surgeon: Wellington Hampshire, MD;  Location: [place] [facility];  Service: Cardiovascular;  Laterality: [name];  . ABDOMINAL AORTOGRAM W/LOWER EXTREMITY Bilateral 08/19/2019   Procedure: ABDOMINAL AORTOGRAM W/LOWER EXTREMITY;  Surgeon: Wellington Hampshire, MD;  Location: [place] [facility];  Service: Cardiovascular;  Laterality: Bilateral;  . CARDIAC CATHETERIZATION [name] 06/29/2016   Procedure: Left Heart Cath and Coronary Angiography;  Surgeon: Minna Merritts, MD;  Location: [place] [facility];  Service: Cardiovascular;  Laterality: [name];  . CARDIAC CATHETERIZATION [name] 08/29/2016   Procedure: Left Heart Cath and Cors/Grafts Angiography;  Surgeon: Wellington Hampshire, MD;  Location: [place] [facility];  Service: Cardiovascular;  Laterality: [name];  . CESAREAN SECTION    . CHOLECYSTECTOMY    . CORONARY ARTERY BYPASS GRAFT [name] 07/06/2016   Procedure: CORONARY ARTERY BYPASS GRAFTING (CABG) x four, using left internal mammary artery and right leg greater saphenous vein harvested endoscopically;  Surgeon: Ivin Poot, MD;  Location: [place];  Service: Open Heart Surgery;  Laterality: [name];  . ENDARTERECTOMY Right 07/06/2016   Procedure: ENDARTERECTOMY CAROTID;  Surgeon: Rosetta Posner, MD;  Location: [place];  Service:  Vascular;  Laterality: Right;  . PERIPHERAL VASCULAR CATHETERIZATION [name] 04/18/2016   Procedure: Renal Angiography;  Surgeon: Wellington Hampshire, MD;  Location: [place] [facility];  Service: Cardiovascular;  Laterality: [name];  . PERIPHERAL VASCULAR INTERVENTION Left 10/15/2018   Procedure: PERIPHERAL VASCULAR INTERVENTION;  Surgeon: Wellington Hampshire, MD;  Location: [place] [facility];  Service:  Cardiovascular;  Laterality: Left;  Left superficial femoral  . TEE WITHOUT CARDIOVERSION [name] 07/06/2016   Procedure: TRANSESOPHAGEAL ECHOCARDIOGRAM (TEE);  Surgeon: Ivin Poot, MD;  Location: [place];  Service: Open Heart Surgery;  Laterality: [name];  . TONSILLECTOMY      Current Meds  Medication Sig  . amLODipine (NORVASC) 10 MG tablet Take 1 tablet (10 mg total) by mouth daily.  Marland Kitchen aspirin EC 81 MG tablet Take 1 tablet (81 mg total) by mouth daily.  . benztropine (COGENTIN) 0.5 MG tablet Take 1 tablet (0.5 mg total) by mouth daily.  . carvedilol (COREG) 25 MG tablet Take 1 tablet (25 mg total) by mouth 2 (two) times daily with a meal.  . chlorproMAZINE (THORAZINE) 50 MG tablet Take 1.5 tablets (75 mg total) by mouth at bedtime.  . Cholecalciferol (VITAMIN D3) 125 MCG (5000 UT) CAPS Take 1 capsule (5,000 Units total) by mouth daily with breakfast. Take along with calcium and magnesium.  . clonazePAM (KLONOPIN) 0.5 MG tablet Take 1 tablet (0.5 mg total) by mouth 3 (three) times daily as needed for anxiety.  . cloNIDine (CATAPRES) 0.1 MG tablet Take 1 tablet (0.1 mg total) by mouth 2 (two) times daily.  . clopidogrel (PLAVIX) 75 MG tablet TAKE 1 TABLET BY MOUTH EVERY DAY  . cyclobenzaprine (FEXMID) 7.5 MG tablet TAKE 1 TABLET BY MOUTH THREE TIMES A DAY AS NEEDED FOR MUSCLE SPASMS  . dapagliflozin propanediol (FARXIGA) 5 MG TABS tablet Take 5 mg by mouth daily before breakfast.  . Evolocumab (REPATHA SURECLICK) [id number] MG/ML SOAJ Inject 1 pen into the skin every 14 (fourteen) days.  Marland Kitchen FLUoxetine HCl 60 MG TABS Take 60 mg by mouth daily.  . insulin glargine, 2 Unit Dial, (TOUJEO MAX SOLOSTAR) 300 UNIT/ML Solostar Pen Inject 110 Units into the skin every morning.  . insulin lispro (HUMALOG KWIKPEN) 100 UNIT/ML KwikPen Inject 0.03 mLs (3 Units total) into the skin 3 (three) times daily with meals. Take only if blood sugar is over 250.  Also pen needles 4/day  . Insulin Syringe-Needle U-100 29G X 1/2"  0.3 ML MISC 1 each by Does not apply route 2 (two) times daily.  . isosorbide mononitrate (IMDUR) 60 MG 24 hr tablet Take 1 tablet (60 mg total) by mouth daily.  Marland Kitchen lamoTRIgine (LAMICTAL) 200 MG tablet Take 1 tablet (200 mg total) by mouth daily.  Marland Kitchen losartan (COZAAR) 100 MG tablet Take 1 tablet (100 mg total) by mouth daily.  . magic mouthwash SOLN Take 15 mLs by mouth 3 (three) times daily as needed for mouth pain.  . Magnesium 500 MG CAPS Take 1 capsule (500 mg total) by mouth 2 (two) times daily at 8 am and 10 pm.  . naloxone (NARCAN) nasal spray 4 mg/0.1 mL 4 mg (contents of 1 nasal spray) as a single dose in one nostril; may repeat every 2 to 3 minutes in alternating nostrils until medical assistance becomes available  . nicotine (NICODERM CQ) 14 mg/24hr patch Place 1 patch (14 mg total) onto the skin daily.  . nitroGLYCERIN (NITROSTAT) 0.4 MG SL tablet Place 1 tablet (0.4 mg total) under  the tongue every 5 (five) minutes as needed for chest pain.  Marland Kitchen ondansetron (ZOFRAN) 4 MG tablet Take 1 tablet (4 mg total) by mouth daily as needed.  Marland Kitchen oxyCODONE (OXY IR/ROXICODONE) 5 MG immediate release tablet Take 1 tablet (5 mg total) by mouth 2 (two) times daily as needed for severe pain. Must last 30 days.  Derrill Memo ON 04/27/2020] oxyCODONE (OXY IR/ROXICODONE) 5 MG immediate release tablet Take 1 tablet (5 mg total) by mouth 2 (two) times daily as needed for severe pain. Must last 30 days.  Derrill Memo ON 05/27/2020] oxyCODONE (OXY IR/ROXICODONE) 5 MG immediate release tablet Take 1 tablet (5 mg total) by mouth 2 (two) times daily as needed for severe pain. Must last 30 days.  . pregabalin (LYRICA) 225 MG capsule Take 1 capsule (225 mg total) by mouth 3 (three) times daily.  . rosuvastatin (CRESTOR) 10 MG tablet Take 1 tablet (10 mg total) by mouth daily.  Marland Kitchen spironolactone (ALDACTONE) 25 MG tablet Take 1 tablet (25 mg total) by mouth daily.    12 system ROS was negative unless otherwise noted in HPI     Observations/Objective: CT scan shows critical stenosis in her right endarterectomy site.  Internal carotid above and common carotid below are normal.  She has no calcification in this restenosis and is either mural thrombus or intimal hyperplasia  Assessment and Plan: I had had extensive discussion with the patient while she was in the office several weeks ago prior to the CT scan.  I have recommended redo carotid endarterectomy for reduction of stroke risk.  She has a wedding on May 1 and I have suggested that this be deferred for 2 weeks until she is had this important event in her life.  I did discuss potential complications  We also discussed her limiting claudication.  She reports that she has very short walking distance ability.  I explained that her young age and with prior failed endovascular treatments elsewhere that I would certainly recommend very conservative approach.  Explained that young women typically have high risk of recurrence and that would put her at higher risk for limb loss over time than conservative treatment alone.  Good   I discussed the assessment and treatment plan with the patient. The patient was provided an opportunity to ask questions and all were answered. The patient agreed with the plan and demonstrated an understanding of the instructions.   The patient was advised to call back or seek an in-person evaluation if the symptoms worsen or if the condition fails to improve as anticipated.  I spent 11-15 minutes with the patient via telephone encounter.   Annamary Rummage [facility] Office: [phone]  04/12/2020, 8:51 AM

## 2020-04-18 ENCOUNTER — Encounter: Payer: Self-pay | Admitting: Pain Medicine

## 2020-04-20 ENCOUNTER — Other Ambulatory Visit: Payer: Self-pay

## 2020-04-20 DIAGNOSIS — E559 Vitamin D deficiency, unspecified: Secondary | ICD-10-CM

## 2020-04-20 MED ORDER — VITAMIN D3 125 MCG (5000 UT) PO CAPS: 1.0000 | ORAL_CAPSULE | Freq: Every day | ORAL | 1 refills | Status: DC

## 2020-04-20 NOTE — Progress Notes (Signed)
[id number] Odis Hollingshead [address] [place] [address] Phone: [phone] Fax: [phone]      Your procedure is scheduled on Wednesday May 5  Report to [facility] Main Entrance "A" at [place].M., and check in at the Admitting office.  Call this number if you have problems the morning of surgery:  [phone]  Call [phone] if you have any questions prior to your surgery date Monday-Friday 8am-4pm    Remember:  Do not eat or drink after midnight the night before your surgery     Take these medicines the morning of surgery with A SIP OF WATER  albuterol (PROVENTIL HFA;VENTOLIN HFA), Please bring all inhalers with you the day of surgery.  amLODipine (NORVASC)  carvedilol (COREG) clonazePAM (KLONOPIN) cyclobenzaprine (FEXMID), if needed FLUoxetine isosorbide mononitrate (IMDUR) lamoTRIgine (LAMICTAL) oxyCODONE (OXY IR/ROXICODONE if needed for pain pregabalin (LYRICA)  Follow your surgeon's instructions on What to do about Aspirin and Plavix.  If no instructions were given by your surgeon then you will need to call the office to get those instructions.     As of today, STOP taking any  Aleve, Naproxen, Ibuprofen, Motrin, Advil, Goody's, BC's, all herbal medications, fish oil, and all vitamins.    WHAT DO I DO ABOUT MY DIABETES MEDICATION?   Marland Kitchen Do not take oral diabetes medicines (pills) the morning of surgery. dapagliflozin propanediol (FARXIGA)  . THE NIGHT BEFORE SURGERY, take ___55________ units of ___insulin glargine, 2 Unit Dial, (TOUJEO MAX SOLOSTAR________insulin.     . If your CBG is greater than 220 mg/dL, you may take  of your sliding scale (correction) dose of insulin. insulin lispro (HUMALOG KWIKPEN)    HOW TO MANAGE YOUR DIABETES BEFORE AND AFTER SURGERY  Why is it important to control my blood sugar before and after surgery? . Improving blood sugar levels before and after surgery helps healing and can  limit problems. . A way of improving blood sugar control is eating a healthy diet by: o  Eating less sugar and carbohydrates o  Increasing activity/exercise o  Talking with your doctor about reaching your blood sugar goals . High blood sugars (greater than 180 mg/dL) can raise your risk of infections and slow your recovery, so you will need to focus on controlling your diabetes during the weeks before surgery. . Make sure that the doctor who takes care of your diabetes knows about your planned surgery including the date and location.  How do I manage my blood sugar before surgery? . Check your blood sugar at least 4 times a day, starting 2 days before surgery, to make sure that the level is not too high or low. . Check your blood sugar the morning of your surgery when you wake up and every 2 hours until you get to the Short Stay unit. o If your blood sugar is less than 70 mg/dL, you will need to treat for low blood sugar: - Do not take insulin. - Treat a low blood sugar (less than 70 mg/dL) with  cup of clear juice (cranberry or apple), 4 glucose tablets, OR glucose gel. - Recheck blood sugar in 15 minutes after treatment (to make sure it is greater than 70 mg/dL). If your blood sugar is not greater than 70 mg/dL on recheck, call [phone] for further instructions. . Report your blood sugar to the short stay nurse when you get to Short Stay.  . If you are admitted to the hospital after surgery: o Your  blood sugar will be checked by the staff and you will probably be given insulin after surgery (instead of oral diabetes medicines) to make sure you have good blood sugar levels. o The goal for blood sugar control after surgery is 80-180 mg/dL.                      Do not wear jewelry, make up, or nail polish            Do not wear lotions, powders, perfumes/colognes, or deodorant.            Do not shave 48 hours prior to surgery.  Men may shave face and neck.            Do not bring  valuables to the hospital.            [facility] is not responsible for any belongings or valuables.  Do NOT Smoke (Tobacco/Vapping) or drink Alcohol 24 hours prior to your procedure If you use a CPAP at night, you may bring all equipment for your overnight stay.   Contacts, glasses, dentures or bridgework may not be worn into surgery.      For patients admitted to the hospital, discharge time will be determined by your treatment team.   Patients discharged the day of surgery will not be allowed to drive home, and someone needs to stay with them for 24 hours.    Special instructions:   [place]- Preparing For Surgery  Before surgery, you can play an important role. Because skin is not sterile, your skin needs to be as free of germs as possible. You can reduce the number of germs on your skin by washing with CHG (chlorahexidine gluconate) Soap before surgery.  CHG is an antiseptic cleaner which kills germs and bonds with the skin to continue killing germs even after washing.    Oral Hygiene is also important to reduce your risk of infection.  Remember - BRUSH YOUR TEETH THE MORNING OF SURGERY WITH YOUR REGULAR TOOTHPASTE  Please do not use if you have an allergy to CHG or antibacterial soaps. If your skin becomes reddened/irritated stop using the CHG.  Do not shave (including legs and underarms) for at least 48 hours prior to first CHG shower. It is OK to shave your face.  Please follow these instructions carefully.   1. Shower the NIGHT BEFORE SURGERY and the MORNING OF SURGERY with CHG Soap.   2. If you chose to wash your hair, wash your hair first as usual with your normal shampoo.  3. After you shampoo, rinse your hair and body thoroughly to remove the shampoo.  4. Use CHG as you would any other liquid soap. You can apply CHG directly to the skin and wash gently with a scrungie or a clean washcloth.   5. Apply the CHG Soap to your body ONLY FROM THE NECK DOWN.  Do not use on  open wounds or open sores. Avoid contact with your eyes, ears, mouth and genitals (private parts). Wash Face and genitals (private parts)  with your normal soap.   6. Wash thoroughly, paying special attention to the area where your surgery will be performed.  7. Thoroughly rinse your body with warm water from the neck down.  8. DO NOT shower/wash with your normal soap after using and rinsing off the CHG Soap.  9. Pat yourself dry with a CLEAN TOWEL.  10. Wear CLEAN PAJAMAS to bed the night before  surgery, wear comfortable clothes the morning of surgery  11. Place CLEAN SHEETS on your bed the night of your first shower and DO NOT SLEEP WITH PETS.   Day of Surgery:   Do not apply any deodorants/lotions.  Please wear clean clothes to the hospital/surgery center.   Remember to brush your teeth WITH YOUR REGULAR TOOTHPASTE.   Please read over the following fact sheets that you were given.

## 2020-04-21 ENCOUNTER — Inpatient Hospital Stay (HOSPITAL_COMMUNITY)
Admission: RE | Admit: 2020-04-21 | Discharge: 2020-04-21 | Disposition: A | Discharge status: Home / Self Care | Payer: Managed Care, Other (non HMO) | Source: Ambulatory Visit

## 2020-04-21 NOTE — Progress Notes (Signed)
[id number] Erika [name] [address] [place] [address] Phone: [phone] Fax: [phone]      Your procedure is scheduled on Wednesday May 5  Report to [facility] Main Entrance "A" at [place].M., and check in at the Admitting office.  Call this number if you have problems the morning of surgery:  [phone]  Call [phone] if you have any questions prior to your surgery date Monday-Friday 8am-4pm    Remember:  Do not eat or drink after midnight the night before your surgery Tues     Take these medicines the morning of surgery with A SIP OF WATER  albuterol (PROVENTIL HFA;VENTOLIN HFA), Please bring all inhalers with you the day of surgery.  amLODipine (NORVASC)  carvedilol (COREG) clonazePAM (KLONOPIN) cyclobenzaprine (FEXMID), if needed FLUoxetine isosorbide mononitrate (IMDUR) lamoTRIgine (LAMICTAL) oxyCODONE (OXY IR/ROXICODONE if needed for pain pregabalin (LYRICA) Nitroglycerin if needed  Follow your surgeon's instructions on What to do about Aspirin and Plavix.  If no instructions were given by your surgeon then you will need to call the office to get those instructions.     As of today, STOP taking any  Aleve, Naproxen, Ibuprofen, Motrin, Advil, Goody's, BC's, all herbal medications, fish oil, and all vitamins.    WHAT DO I DO ABOUT MY DIABETES MEDICATION?  Do not take oral diabetes medicines (pills) the day prior to surgery Tues and the day of surgery Wed.dapagliflozin propanediol (FARXIGA).   . THE NIGHT BEFORE SURGERY, take ___55________ units of ___insulin glargine, 2 Unit Dial, (TOUJEO MAX SOLOSTAR________insulin.     . If your CBG is greater than 220 mg/dL, you may take  of your sliding scale (correction) dose of insulin. insulin lispro (HUMALOG KWIKPEN)    HOW TO MANAGE YOUR DIABETES BEFORE AND AFTER SURGERY  Why is it important to control my blood sugar before and after surgery? . Improving blood  sugar levels before and after surgery helps healing and can limit problems. . A way of improving blood sugar control is eating a healthy diet by: o  Eating less sugar and carbohydrates o  Increasing activity/exercise o  Talking with your doctor about reaching your blood sugar goals . High blood sugars (greater than 180 mg/dL) can raise your risk of infections and slow your recovery, so you will need to focus on controlling your diabetes during the weeks before surgery. . Make sure that the doctor who takes care of your diabetes knows about your planned surgery including the date and location.  How do I manage my blood sugar before surgery?Marland Kitchen . Check your blood sugar the morning of your surgery when you wake up and every 2 hours until you get to the Short Stay unit. o If your blood sugar is less than 70 mg/dL, you will need to treat for low blood sugar: - Treat a low blood sugar (less than 70 mg/dL) with  cup of clear juice (cranberry or apple), 4 glucose tablets, OR glucose gel. - Recheck blood sugar in 15 minutes after treatment (to make sure it is greater than 70 mg/dL). If your blood sugar is not greater than 70 mg/dL on recheck, call [phone] for further instructions. . Report your blood sugar to the short stay nurse when you get to Short Stay.  . If you are admitted to the hospital after surgery: o Your blood sugar will be checked by the staff and you will probably be given insulin after surgery (instead of oral diabetes medicines) to  make sure you have good blood sugar levels. o The goal for blood sugar control after surgery is 80-180 mg/dL.                      Do not wear jewelry, make up, or nail polish            Do not wear lotions, powders, perfumes, or deodorant.            Do not shave 48 hours prior to surgery.             Do not bring valuables to the hospital.            [facility] is not responsible for any belongings or valuables.  Do NOT Smoke (Tobacco/Vapping) or  drink Alcohol 24 hours prior to your procedure If you use a CPAP at night, you may bring all equipment for your overnight stay.   Contacts, glasses, dentures or bridgework may not be worn into surgery.      For patients admitted to the hospital, discharge time will be determined by your treatment team.   Patients discharged the day of surgery will not be allowed to drive home, and someone needs to stay with them for 24 hours.    Special instructions:   [place]- Preparing For Surgery  Before surgery, you can play an important role. Because skin is not sterile, your skin needs to be as free of germs as possible. You can reduce the number of germs on your skin by washing with CHG (chlorahexidine gluconate) Soap before surgery.  CHG is an antiseptic cleaner which kills germs and bonds with the skin to continue killing germs even after washing.    Oral Hygiene is also important to reduce your risk of infection.  Remember - BRUSH YOUR TEETH THE MORNING OF SURGERY WITH YOUR REGULAR TOOTHPASTE  Please do not use if you have an allergy to CHG or antibacterial soaps. If your skin becomes reddened/irritated stop using the CHG.  Do not shave (including legs and underarms) for at least 48 hours prior to first CHG shower. It is OK to shave your face.  Please follow these instructions carefully.   1. Shower the NIGHT BEFORE SURGERY Tues and the MORNING OF SURGERY Wed with CHG Soap.   2. If you chose to wash your hair, wash your hair first as usual with your normal shampoo.  3. After you shampoo, rinse your hair and body thoroughly to remove the shampoo.  4. Use CHG as you would any other liquid soap. You can apply CHG directly to the skin and wash gently with a scrungie or a clean washcloth.   5. Apply the CHG Soap to your body ONLY FROM THE NECK DOWN.  Do not use on open wounds or open sores. Avoid contact with your eyes, ears, mouth and genitals (private parts). Wash Face and genitals (private  parts)  with your normal soap.   6. Wash thoroughly, paying special attention to the area where your surgery will be performed.  7. Thoroughly rinse your body with warm water from the neck down.  8. DO NOT shower/wash with your normal soap after using and rinsing off the CHG Soap.  9. Pat yourself dry with a CLEAN TOWEL.  10. Wear CLEAN PAJAMAS to bed the night before surgery, wear comfortable clothes the morning of surgery  11. Place CLEAN SHEETS on your bed the night of your first shower and DO NOT SLEEP WITH  PETS.   Day of Surgery:   Do not apply any deodorants/lotions.  Please wear clean clothes to the hospital/surgery center.   Remember to brush your teeth WITH YOUR REGULAR TOOTHPASTE.   Please read over the following fact sheets that you were given.

## 2020-04-21 NOTE — Progress Notes (Signed)
[id number] Erika [name] [address] [place] [address] Phone: [phone] Fax: [phone]    Your procedure is scheduled on Wednesday May 5th.  Report to [facility] Main Entrance "A" at [place].M., and check in at the Admitting office.  Call this number if you have problems the morning of surgery:  [phone]  Call [phone] if you have any questions prior to your surgery date Monday-Friday 8am-4pm   Remember:  Do not eat or drink after midnight the night before your surgery   Take these medicines the morning of surgery with A SIP OF WATER  amLODipine (NORVASC)     carvedilol (COREG) FLUoxetine isosorbide mononitrate (IMDUR) lamoTRIgine (LAMICTAL) oxyCODONE (OXY IR/ROXICODONE if needed for pain pregabalin (LYRICA)  If needed - clonazePAM (KLONOPIN), cyclobenzaprine (FEXMID),   albuterol (PROVENTIL HFA;VENTOLIN HFA)/inhaler - bring with you the day of surgery  Follow your surgeon's instructions on What to do about Aspirin and Plavix.  If no instructions were given by your surgeon then you will need to call the office to get those instructions.    As of today, STOP taking any  Aleve, Naproxen, Ibuprofen, Motrin, Advil, Goody's, BC's, all herbal medications, fish oil, and all vitamins.  WHAT DO I DO ABOUT MY DIABETES MEDICATION?  - The day before surgery Do NOT take dapagliflozin propanediol (FARXIGA)  -The morning of surgery . Do not take oral diabetes medicines (pills) the morning of surgery. dapagliflozin propanediol (FARXIGA) . Take 55 units of insulin glargine, 2 Unit Dial, (TOUJEO MAX SOLOSTAR)   . If your CBG is greater than 220 mg/dL, you may take  of your sliding scale (correction) dose of insulin. insulin lispro (HUMALOG KWIKPEN)   HOW TO MANAGE YOUR DIABETES BEFORE AND AFTER SURGERY  Why is it important to control my blood sugar before and after surgery? . Improving blood sugar levels before and after surgery  helps healing and can limit problems. . A way of improving blood sugar control is eating a healthy diet by: o  Eating less sugar and carbohydrates o  Increasing activity/exercise o  Talking with your doctor about reaching your blood sugar goals . High blood sugars (greater than 180 mg/dL) can raise your risk of infections and slow your recovery, so you will need to focus on controlling your diabetes during the weeks before surgery. . Make sure that the doctor who takes care of your diabetes knows about your planned surgery including the date and location.  How do I manage my blood sugar before surgery? . Check your blood sugar at least 4 times a day, starting 2 days before surgery, to make sure that the level is not too high or low. . Check your blood sugar the morning of your surgery when you wake up and every 2 hours until you get to the Short Stay unit. o If your blood sugar is less than 70 mg/dL, you will need to treat for low blood sugar: - Do not take insulin. - Treat a low blood sugar (less than 70 mg/dL) with  cup of clear juice (cranberry or apple), 4 glucose tablets, OR glucose gel. - Recheck blood sugar in 15 minutes after treatment (to make sure it is greater than 70 mg/dL). If your blood sugar is not greater than 70 mg/dL on recheck, call [phone] for further instructions. . Report your blood sugar to the short stay nurse when you get to Short Stay.  . If you are admitted to the hospital after  surgery: o Your blood sugar will be checked by the staff and you will probably be given insulin after surgery (instead of oral diabetes medicines) to make sure you have good blood sugar levels. o The goal for blood sugar control after surgery is 80-180 mg/dL.                      Do not wear jewelry, make up, or nail polish            Do not wear lotions, powders, perfumes/colognes, or deodorant.            Do not shave 48 hours prior to surgery.  Men may shave face and neck.             Do not bring valuables to the hospital.            [facility] is not responsible for any belongings or valuables.  Do NOT Smoke (Tobacco/Vapping) or drink Alcohol 24 hours prior to your procedure If you use a CPAP at night, you may bring all equipment for your overnight stay.   Contacts, glasses, dentures or bridgework may not be worn into surgery.      For patients admitted to the hospital, discharge time will be determined by your treatment team.   Patients discharged the day of surgery will not be allowed to drive home, and someone needs to stay with them for 24 hours.    Special instructions:   [place]- Preparing For Surgery  Before surgery, you can play an important role. Because skin is not sterile, your skin needs to be as free of germs as possible. You can reduce the number of germs on your skin by washing with CHG (chlorahexidine gluconate) Soap before surgery.  CHG is an antiseptic cleaner which kills germs and bonds with the skin to continue killing germs even after washing.    Oral Hygiene is also important to reduce your risk of infection.  Remember - BRUSH YOUR TEETH THE MORNING OF SURGERY WITH YOUR REGULAR TOOTHPASTE  Please do not use if you have an allergy to CHG or antibacterial soaps. If your skin becomes reddened/irritated stop using the CHG.  Do not shave (including legs and underarms) for at least 48 hours prior to first CHG shower. It is OK to shave your face.  Please follow these instructions carefully.   1. Shower the NIGHT BEFORE SURGERY and the MORNING OF SURGERY with CHG Soap.   2. If you chose to wash your hair, wash your hair first as usual with your normal shampoo.  3. After you shampoo, rinse your hair and body thoroughly to remove the shampoo.  4. Use CHG as you would any other liquid soap. You can apply CHG directly to the skin and wash gently with a scrungie or a clean washcloth.   5. Apply the CHG Soap to your body ONLY FROM THE NECK DOWN.   Do not use on open wounds or open sores. Avoid contact with your eyes, ears, mouth and genitals (private parts). Wash Face and genitals (private parts)  with your normal soap.   6. Wash thoroughly, paying special attention to the area where your surgery will be performed.  7. Thoroughly rinse your body with warm water from the neck down.  8. DO NOT shower/wash with your normal soap after using and rinsing off the CHG Soap.  9. Pat yourself dry with a CLEAN TOWEL.  10. Wear CLEAN PAJAMAS to bed  the night before surgery, wear comfortable clothes the morning of surgery  11. Place CLEAN SHEETS on your bed the night of your first shower and DO NOT SLEEP WITH PETS.   Day of Surgery:   Do not apply any deodorants/lotions.  Please wear clean clothes to the hospital/surgery center.   Remember to brush your teeth WITH YOUR REGULAR TOOTHPASTE.   Please read over the following fact sheets that you were given.

## 2020-04-21 NOTE — Progress Notes (Addendum)
Patient denies shortness of breath, fever, cough and chest pain.  PCP - None Presently (Used to see Dr Arnette Norris) Cardiologist - Laurann Montana, NP, Dr Zollie Beck[facility] - Dr Berniece Andreas Internal Med - Dr Renato Shin Pain management - Dr Dossie Arbour  Chest x-ray - n/a EKG - 02/05/20 Stress Test - 04/13/16 ECHO TEE - 07/06/16 Cardiac Cath - 09/08/16  Fasting Blood Sugar -  150-190 Checks Blood Sugar  2-3  times a day  Blood Thinner Instructions:  Follow your surgeon's instructions on when to stop Plavix prior to surgery.  If no instructions were given by your surgeon then you will need to call the office for those instructions.  Aspirin Instructions: Follow your surgeon's instructions on when to stop aspirin prior to surgery.  If no instructions were given by your surgeon then you will need to call the office for those instructions.  Anesthesia review: Yes.  Jeneen Rinks, [place] saw patient at [facility] appt.  BP 214/108, CBG 341, recheck of BP at end of PV 170/124.  Called MD's office, [facility] [phone] inform them of abn labs at [facility] appt.  Coronavirus Screening Covid test scheduled 04/25/20@12 :50 pm [facility] Have you experienced the following symptoms:  Cough yes/no: No Fever (>100.45F)  yes/no: No Runny nose yes/no: No Sore throat yes/no: No Difficulty breathing/shortness of breath  yes/no: No  Have you traveled in the last 14 days and where? yes/no: No  Patient verbalized understanding of instructions that were given to them at the PAT appointment.

## 2020-04-22 ENCOUNTER — Encounter (HOSPITAL_COMMUNITY): Payer: Self-pay

## 2020-04-22 ENCOUNTER — Other Ambulatory Visit: Payer: Self-pay

## 2020-04-22 ENCOUNTER — Encounter (HOSPITAL_COMMUNITY)
Admission: RE | Admit: 2020-04-22 | Discharge: 2020-04-22 | Disposition: A | Discharge status: Home / Self Care | Payer: Managed Care, Other (non HMO) | Source: Ambulatory Visit | Attending: Vascular Surgery | Admitting: Vascular Surgery

## 2020-04-22 ENCOUNTER — Telehealth: Payer: Self-pay

## 2020-04-22 DIAGNOSIS — I252 Old myocardial infarction: Secondary | ICD-10-CM | POA: Diagnosis not present

## 2020-04-22 DIAGNOSIS — Z01812 Encounter for preprocedural laboratory examination: Secondary | ICD-10-CM | POA: Diagnosis present

## 2020-04-22 DIAGNOSIS — Z79899 Other long term (current) drug therapy: Secondary | ICD-10-CM | POA: Insufficient documentation

## 2020-04-22 DIAGNOSIS — I739 Peripheral vascular disease, unspecified: Secondary | ICD-10-CM | POA: Diagnosis not present

## 2020-04-22 DIAGNOSIS — I10 Essential (primary) hypertension: Secondary | ICD-10-CM | POA: Diagnosis not present

## 2020-04-22 DIAGNOSIS — I2510 Atherosclerotic heart disease of native coronary artery without angina pectoris: Secondary | ICD-10-CM | POA: Insufficient documentation

## 2020-04-22 DIAGNOSIS — N390 Urinary tract infection, site not specified: Secondary | ICD-10-CM

## 2020-04-22 DIAGNOSIS — Z951 Presence of aortocoronary bypass graft: Secondary | ICD-10-CM | POA: Insufficient documentation

## 2020-04-22 DIAGNOSIS — Z7901 Long term (current) use of anticoagulants: Secondary | ICD-10-CM | POA: Diagnosis not present

## 2020-04-22 HISTORY — DX: Peripheral vascular disease, unspecified: I73.9

## 2020-04-22 HISTORY — DX: Personal history of other medical treatment: Z92.89

## 2020-04-22 LAB — COMPREHENSIVE METABOLIC PANEL
ALT: 23 U/L (ref 0–44)
AST: 19 U/L (ref 15–41)
Albumin: 4.1 g/dL (ref 3.5–5.0)
Alkaline Phosphatase: 81 U/L (ref 38–126)
Anion gap: 10 (ref 5–15)
BUN: 8 mg/dL (ref 6–20)
CO2: 23 mmol/L (ref 22–32)
Calcium: 9.9 mg/dL (ref 8.9–10.3)
Chloride: 101 mmol/L (ref 98–111)
Creatinine, Ser: 0.69 mg/dL (ref 0.44–1.00)
GFR calc Af Amer: >60 mL/min (ref >60)
GFR calc non Af Amer: >60 mL/min (ref >60)
Glucose, Bld: 355 mg/dL — ABNORMAL HIGH (ref 70–99)
Potassium: 4.6 mmol/L (ref 3.5–5.1)
Sodium: 134 mmol/L — ABNORMAL LOW (ref 135–145)
Total Bilirubin: 0.5 mg/dL (ref 0.3–1.2)
Total Protein: 7.3 g/dL (ref 6.5–8.1)

## 2020-04-22 LAB — CBC
HCT: 52 % — ABNORMAL HIGH (ref 36.0–46.0)
Hemoglobin: 17.1 g/dL — ABNORMAL HIGH (ref 12.0–15.0)
MCH: 28.5 pg (ref 26.0–34.0)
MCHC: 32.9 g/dL (ref 30.0–36.0)
MCV: 86.5 fL (ref 80.0–100.0)
Platelets: 568 10*3/uL — ABNORMAL HIGH (ref 150–400)
RBC: 6.01 MIL/uL — ABNORMAL HIGH (ref 3.87–5.11)
RDW: 13.9 % (ref 11.5–15.5)
WBC: 16.5 10*3/uL — ABNORMAL HIGH (ref 4.0–10.5)
nRBC: 0 % (ref 0.0–0.2)

## 2020-04-22 LAB — GLUCOSE, CAPILLARY: Glucose-Capillary: 341 mg/dL — ABNORMAL HIGH (ref 70–99)

## 2020-04-22 LAB — URINALYSIS, ROUTINE W REFLEX MICROSCOPIC
Bilirubin Urine: NEGATIVE
Glucose, UA: >=500 mg/dL — AB
Hgb urine dipstick: NEGATIVE
Ketones, ur: 5 mg/dL — AB
Nitrite: NEGATIVE
Protein, ur: 30 mg/dL — AB
Specific Gravity, Urine: 1.022 (ref 1.005–1.030)
pH: 5 (ref 5.0–8.0)

## 2020-04-22 LAB — PROTIME-INR
INR: 1 (ref 0.8–1.2)
Prothrombin Time: 12.4 seconds (ref 11.4–15.2)

## 2020-04-22 LAB — TYPE AND SCREEN
ABO/RH(D): B POS
Antibody Screen: NEGATIVE

## 2020-04-22 LAB — APTT: aPTT: 28 seconds (ref 24–36)

## 2020-04-22 LAB — SURGICAL PCR SCREEN
MRSA, PCR: NEGATIVE
Staphylococcus aureus: NEGATIVE

## 2020-04-22 MED ORDER — CIPROFLOXACIN HCL 500 MG PO TABS: 500.0000 mg | ORAL_TABLET | Freq: Two times a day (BID) | ORAL | 0 refills | Status: DC

## 2020-04-22 NOTE — Telephone Encounter (Signed)
Erika [name] with preadmit testing called and said that it appeared patient has a UTI looking at her urine.   Notified patient that we would call in an antibiotic as well.    Also notified Dr Donnetta Hutching that patient had abnormal CBG and BP as well.   York Cerise, CMA

## 2020-04-25 ENCOUNTER — Other Ambulatory Visit
Admission: RE | Admit: 2020-04-25 | Discharge: 2020-04-25 | Disposition: A | Discharge status: Home / Self Care | Payer: Managed Care, Other (non HMO) | Source: Ambulatory Visit | Attending: Vascular Surgery | Admitting: Vascular Surgery

## 2020-04-25 DIAGNOSIS — Z20822 Contact with and (suspected) exposure to covid-19: Secondary | ICD-10-CM | POA: Diagnosis not present

## 2020-04-25 DIAGNOSIS — Z01812 Encounter for preprocedural laboratory examination: Secondary | ICD-10-CM | POA: Diagnosis not present

## 2020-04-25 LAB — SARS CORONAVIRUS 2 (TAT 6-24 HRS): SARS Coronavirus 2: NEGATIVE

## 2020-04-25 NOTE — Progress Notes (Signed)
Anesthesia Chart Review:  Follows with cardiology for hx of of CAD s/p CABG, carotid disease s/p R carotid endarterectomy, HTN, PAD s/p stenting left SFA (now with diffuse disease in the SFA with occlusion distally). NSTEMI 2017.  Found to have three-vessel coronary artery disease at the time and underwent CABG and right carotid endarterectomy.  Rehospitalized September 2017 with chest pain in the setting of uncontrolled hypertension.  Cardiac catheterization showed patent graft.  LAD with noted diffuse disease distal to the anastomosis and very small in caliber.  EF normal with mildly elevated LVEDP.  She has hx of very difficult to control HTN. She reports her current medication regiment is Carvedilol, clonidine, spironolactone in the morning; Amlodipine at 6pm; losartan, carvedilol, imdur at night. She was last seen by cardiology 02/05/20 and her Imdur was increased from 30mg  to 60mg . At that time a repeat carotid US was ordered which showed restenosis of the right carotid and Dr. Fletcher Anon urgently referred pt back to Dr. Donnetta Hutching.   IDDMII with hx of medication noncompliance. Followed by Dr. Loanne Drilling. BG 355 at PAT. Last A1c 9.5 on 04/11/20.   BP at PAT initially 214/108. On recheck 170/124. Dr. Donnetta Hutching notified. Pt was counseled at length regarding medication compliance and monitoring of BP and BG. She was advised to followup with cardiology if BP remains this high (she states it has been this high for years despite continued titration of meds). She understands that markedly uncontrolled HTN or BG on DOS could be prohibitive for her procedure.   BP Readings from Last 3 Encounters:  04/22/20 (!) 170/124  04/12/20 (!) 163/100  04/11/20 (!) 160/100   Discussed case with Dr. Tobias Alexander. He advised that Dr. Donnetta Hutching should be made aware of uncontrolled BP and BG. Proceed as planned. Will need DOS eval.  EKG 02/05/20: NSR. Rate 84. Nonspecific T wave abnormality.   Right carotid duplex 03/29/20: Summary:  Right  Carotid: Velocities in the right ICA are consistent with a 80-99% stenosis. Non-hemodynamically significant plaque <50% noted  in the CCA. Probable ECA Occlusion.  Vertebrals: Right vertebral artery demonstrates antegrade flow.  Subclavians: Right subclavian artery flow was disturbed.   Cath 08/29/16: 1. Severe underlying three-vessel coronary artery disease with patent grafts including SVG to right PDA, sequential vein graft to diagonal 1/ OM 1 and LIMA to LAD. The LIMA makes an unusual turn distally before the anastomosis with diffuse disease in the LAD distal to the anastomosis. The vessel is overall small in caliber with the dominant vessel being the large diagonal branch. 2. Normal LV systolic function and mildly elevated left ventricular end-diastolic pressure.  Recommendations: Continue aggressive medical therapy. I suspect that her symptoms were likely related to uncontrolled hypertension. I increased the dose of carvedilol to 25 mg twice daily. I agree with the addition of small dose long acting nitroglycerin as well. Monitor overnight and possible discharge home tomorrow.   Wynonia Musty [facility] Short Stay Center/Anesthesiology Phone [phone] 04/25/2020 10:57 AM

## 2020-04-25 NOTE — Anesthesia Preprocedure Evaluation (Addendum)
Anesthesia Evaluation  Patient identified by MRN, date of birth, ID band Patient awake    Reviewed: Allergy & Precautions, NPO status , Patient's Chart, lab work & pertinent test results  Airway Mallampati: II  TM Distance: >3 FB Neck ROM: Full    Dental  (+) Dental Advisory Given, Missing, Poor Dentition   Pulmonary Current SmokerPatient did not abstain from smoking.,    Pulmonary exam normal breath sounds clear to auscultation       Cardiovascular hypertension, Pt. on medications and Pt. on home beta blockers (-) angina+ CAD, + Past MI, + CABG and + Peripheral Vascular Disease (s/p RCEA)  Normal cardiovascular exam Rhythm:Regular Rate:Normal     Neuro/Psych  Headaches, PSYCHIATRIC DISORDERS Anxiety Depression  Neuromuscular disease    GI/Hepatic negative GI ROS, Neg liver ROS,   Endo/Other  diabetes, Type 2, Oral Hypoglycemic Agents, Insulin DependentObesity   Renal/GU negative Renal ROS     Musculoskeletal  (+) Arthritis ,   Abdominal   Peds  Hematology  (+) Blood dyscrasia (Plavix), ,   Anesthesia Other Findings Day of surgery medications reviewed with the patient.  Reproductive/Obstetrics                           Anesthesia Physical Anesthesia Plan  ASA: IV  Anesthesia Plan: General   Post-op Pain Management:    Induction: Intravenous  PONV Risk Score and Plan: 3 and Midazolam, Dexamethasone and Ondansetron  Airway Management Planned: Oral ETT  Additional Equipment: Arterial line  Intra-op Plan:   Post-operative Plan: Extubation in OR  Informed Consent: I have reviewed the patients History and Physical, chart, labs and discussed the procedure including the risks, benefits and alternatives for the proposed anesthesia with the patient or authorized representative who has indicated his/her understanding and acceptance.     Dental advisory given  Plan Discussed with:  CRNA  Anesthesia Plan Comments: (REMIFENTANIL INFUSION.  PAT note by Karoline Caldwell, PA-C: Follows with cardiology for hx of of CAD s/p CABG, carotid disease s/p R carotid endarterectomy, HTN, PAD s/p stenting left SFA (now with diffuse disease in the SFA with occlusion distally). NSTEMI 2017.  Found to have three-vessel coronary artery disease at the time and underwent CABG and right carotid endarterectomy.  Rehospitalized September 2017 with chest pain in the setting of uncontrolled hypertension.  Cardiac catheterization showed patent graft.  LAD with noted diffuse disease distal to the anastomosis and very small in caliber.  EF normal with mildly elevated LVEDP.  She has hx of very difficult to control HTN. She reports her current medication regiment is Carvedilol, clonidine, spironolactone in the morning; Amlodipine at 6pm; losartan, carvedilol, imdur at night. She was last seen by cardiology 02/05/20 and her Imdur was increased from 30mg  to 60mg . At that time a repeat carotid US was ordered which showed restenosis of the right carotid and Dr. Fletcher Anon urgently referred pt back to Dr. Donnetta Hutching.   IDDMII with hx of medication noncompliance. Followed by Dr. Loanne Drilling. BG 355 at PAT. Last A1c 9.5 on 04/11/20.   BP at PAT initially 214/108. On recheck 170/124. Dr. Donnetta Hutching notified. Pt was counseled at length regarding medication compliance and monitoring of BP and BG. She was advised to followup with cardiology if BP remains this high (she states it has been this high for years despite continued titration of meds). She understands that markedly uncontrolled HTN or BG on DOS could be prohibitive for her procedure.   BP  Readings from Last 3 Encounters: 04/22/20 (!) 170/124 04/12/20 (!) 163/100 04/11/20 (!) 160/100  Discussed case with Dr. Tobias Alexander. He advised that Dr. Donnetta Hutching should be made aware of uncontrolled BP and BG. Proceed as planned. Will need DOS eval.  EKG 02/05/20: NSR. Rate 84. Nonspecific T wave  abnormality.   Right carotid duplex 03/29/20: Summary:  Right Carotid: Velocities in the right ICA are consistent with a 80-99% stenosis. Non-hemodynamically significant plaque <50% noted  in the CCA. Probable ECA Occlusion.  Vertebrals: Right vertebral artery demonstrates antegrade flow.  Subclavians: Right subclavian artery flow was disturbed.   Cath 08/29/16: 1. Severe underlying three-vessel coronary artery disease with patent grafts including SVG to right PDA, sequential vein graft to diagonal 1/ OM 1 and LIMA to LAD. The LIMA makes an unusual turn distally before the anastomosis with diffuse disease in the LAD distal to the anastomosis. The vessel is overall small in caliber with the dominant vessel being the large diagonal branch. 2. Normal LV systolic function and mildly elevated left ventricular end-diastolic pressure.  Recommendations: Continue aggressive medical therapy. I suspect that her symptoms were likely related to uncontrolled hypertension. I increased the dose of carvedilol to 25 mg twice daily. I agree with the addition of small dose long acting nitroglycerin as well. Monitor overnight and possible discharge home tomorrow. )      Anesthesia Quick Evaluation

## 2020-04-27 ENCOUNTER — Inpatient Hospital Stay (HOSPITAL_COMMUNITY): Payer: Managed Care, Other (non HMO) | Admitting: Physician Assistant

## 2020-04-27 ENCOUNTER — Inpatient Hospital Stay (HOSPITAL_COMMUNITY): Payer: Managed Care, Other (non HMO) | Admitting: Certified Registered Nurse Anesthetist

## 2020-04-27 ENCOUNTER — Encounter (HOSPITAL_COMMUNITY)
Admission: RE | Disposition: A | Discharge status: Home / Self Care | Payer: Self-pay | Source: Home / Self Care | Attending: Vascular Surgery

## 2020-04-27 ENCOUNTER — Encounter (HOSPITAL_COMMUNITY): Payer: Self-pay | Admitting: Vascular Surgery

## 2020-04-27 ENCOUNTER — Inpatient Hospital Stay (HOSPITAL_COMMUNITY)
Admission: RE | Admit: 2020-04-27 | Discharge: 2020-04-28 | DRG: 039 | Disposition: A | Discharge status: Home / Self Care | Payer: Managed Care, Other (non HMO) | Attending: Vascular Surgery | Admitting: Vascular Surgery

## 2020-04-27 ENCOUNTER — Other Ambulatory Visit: Payer: Self-pay

## 2020-04-27 DIAGNOSIS — Z79899 Other long term (current) drug therapy: Secondary | ICD-10-CM

## 2020-04-27 DIAGNOSIS — F17210 Nicotine dependence, cigarettes, uncomplicated: Secondary | ICD-10-CM | POA: Diagnosis present

## 2020-04-27 DIAGNOSIS — Z7902 Long term (current) use of antithrombotics/antiplatelets: Secondary | ICD-10-CM

## 2020-04-27 DIAGNOSIS — Z7982 Long term (current) use of aspirin: Secondary | ICD-10-CM

## 2020-04-27 DIAGNOSIS — E1151 Type 2 diabetes mellitus with diabetic peripheral angiopathy without gangrene: Secondary | ICD-10-CM | POA: Diagnosis present

## 2020-04-27 DIAGNOSIS — I6521 Occlusion and stenosis of right carotid artery: Principal | ICD-10-CM | POA: Diagnosis present

## 2020-04-27 DIAGNOSIS — F329 Major depressive disorder, single episode, unspecified: Secondary | ICD-10-CM | POA: Diagnosis present

## 2020-04-27 DIAGNOSIS — I2510 Atherosclerotic heart disease of native coronary artery without angina pectoris: Secondary | ICD-10-CM | POA: Diagnosis present

## 2020-04-27 DIAGNOSIS — F4310 Post-traumatic stress disorder, unspecified: Secondary | ICD-10-CM | POA: Diagnosis present

## 2020-04-27 DIAGNOSIS — I252 Old myocardial infarction: Secondary | ICD-10-CM

## 2020-04-27 DIAGNOSIS — Z79891 Long term (current) use of opiate analgesic: Secondary | ICD-10-CM | POA: Diagnosis not present

## 2020-04-27 DIAGNOSIS — Z20822 Contact with and (suspected) exposure to covid-19: Secondary | ICD-10-CM | POA: Diagnosis present

## 2020-04-27 DIAGNOSIS — Z794 Long term (current) use of insulin: Secondary | ICD-10-CM | POA: Diagnosis not present

## 2020-04-27 DIAGNOSIS — E785 Hyperlipidemia, unspecified: Secondary | ICD-10-CM | POA: Diagnosis present

## 2020-04-27 DIAGNOSIS — Z951 Presence of aortocoronary bypass graft: Secondary | ICD-10-CM

## 2020-04-27 HISTORY — PX: ENDARTERECTOMY: SHX5162

## 2020-04-27 LAB — GLUCOSE, CAPILLARY
Glucose-Capillary: 340 mg/dL — ABNORMAL HIGH (ref 70–99)
Glucose-Capillary: 241 mg/dL — ABNORMAL HIGH (ref 70–99)
Glucose-Capillary: 205 mg/dL — ABNORMAL HIGH (ref 70–99)
Glucose-Capillary: 191 mg/dL — ABNORMAL HIGH (ref 70–99)
Glucose-Capillary: 184 mg/dL — ABNORMAL HIGH (ref 70–99)

## 2020-04-27 LAB — POCT PREGNANCY, URINE: Preg Test, Ur: NEGATIVE

## 2020-04-27 SURGERY — ENDARTERECTOMY, CAROTID
Anesthesia: General | Laterality: Right

## 2020-04-27 MED ORDER — SODIUM CHLORIDE 0.9 % IV SOLN: 500.0000 mL | Freq: Once | INTRAVENOUS | Status: DC | PRN

## 2020-04-27 MED ORDER — PROPOFOL 10 MG/ML IV BOLUS
INTRAVENOUS | Status: DC | PRN
  Administered 2020-04-27: 160 mg via INTRAVENOUS

## 2020-04-27 MED ORDER — AMLODIPINE BESYLATE 10 MG PO TABS
10.0000 mg | ORAL_TABLET | Freq: Every day | ORAL | Status: DC
  Administered 2020-04-28: 10 mg via ORAL
  Filled 2020-04-27: qty 1

## 2020-04-27 MED ORDER — PHENYLEPHRINE 40 MCG/ML (10ML) SYRINGE FOR IV PUSH (FOR BLOOD PRESSURE SUPPORT)
PREFILLED_SYRINGE | INTRAVENOUS | Status: DC | PRN
  Administered 2020-04-27 (×3): 80 ug via INTRAVENOUS
  Administered 2020-04-27: 160 ug via INTRAVENOUS

## 2020-04-27 MED ORDER — CHLORHEXIDINE GLUCONATE CLOTH 2 % EX PADS: 6.0000 | MEDICATED_PAD | Freq: Once | CUTANEOUS | Status: DC

## 2020-04-27 MED ORDER — SODIUM CHLORIDE (PF) 0.9 % IJ SOLN
INTRAMUSCULAR | Status: AC
  Filled 2020-04-27: qty 10

## 2020-04-27 MED ORDER — FENTANYL CITRATE (PF) 250 MCG/5ML IJ SOLN
INTRAMUSCULAR | Status: DC | PRN
  Administered 2020-04-27: 100 ug via INTRAVENOUS
  Administered 2020-04-27 (×3): 50 ug via INTRAVENOUS

## 2020-04-27 MED ORDER — MAGNESIUM SULFATE 2 GM/50ML IV SOLN: 2.0000 g | Freq: Every day | INTRAVENOUS | Status: DC | PRN

## 2020-04-27 MED ORDER — MORPHINE SULFATE (PF) 2 MG/ML IV SOLN
2.0000 mg | INTRAVENOUS | Status: DC | PRN
  Filled 2020-04-27: qty 1

## 2020-04-27 MED ORDER — ROCURONIUM BROMIDE 10 MG/ML (PF) SYRINGE
PREFILLED_SYRINGE | INTRAVENOUS | Status: AC
  Filled 2020-04-27: qty 10

## 2020-04-27 MED ORDER — INSULIN GLARGINE 100 UNIT/ML ~~LOC~~ SOLN
110.0000 [IU] | Freq: Every day | SUBCUTANEOUS | Status: DC
  Administered 2020-04-28: 110 [IU] via SUBCUTANEOUS
  Filled 2020-04-27: qty 1.1

## 2020-04-27 MED ORDER — PROTAMINE SULFATE 10 MG/ML IV SOLN
INTRAVENOUS | Status: DC | PRN
Start: 2020-04-27 — End: 2020-04-27
  Administered 2020-04-27: 50 mg via INTRAVENOUS

## 2020-04-27 MED ORDER — BENZTROPINE MESYLATE 0.5 MG PO TABS
0.5000 mg | ORAL_TABLET | Freq: Every day | ORAL | Status: DC
  Administered 2020-04-27: 0.5 mg via ORAL
  Filled 2020-04-27: qty 1

## 2020-04-27 MED ORDER — CHLORPROMAZINE HCL 25 MG PO TABS
75.0000 mg | ORAL_TABLET | Freq: Every day | ORAL | Status: DC
  Administered 2020-04-27: 75 mg via ORAL
  Filled 2020-04-27 (×2): qty 3

## 2020-04-27 MED ORDER — ALUM & MAG HYDROXIDE-SIMETH 200-200-20 MG/5ML PO SUSP: 15.0000 mL | ORAL | Status: DC | PRN

## 2020-04-27 MED ORDER — INSULIN ASPART 100 UNIT/ML ~~LOC~~ SOLN
10.0000 [IU] | Freq: Once | SUBCUTANEOUS | Status: AC
  Administered 2020-04-27: 10 [IU] via INTRAVENOUS
  Filled 2020-04-27: qty 1

## 2020-04-27 MED ORDER — INSULIN ASPART 100 UNIT/ML ~~LOC~~ SOLN
SUBCUTANEOUS | Status: AC
  Filled 2020-04-27: qty 1

## 2020-04-27 MED ORDER — LABETALOL HCL 5 MG/ML IV SOLN: 10.0000 mg | INTRAVENOUS | Status: DC | PRN

## 2020-04-27 MED ORDER — PROTAMINE SULFATE 10 MG/ML IV SOLN
INTRAVENOUS | Status: AC
  Filled 2020-04-27: qty 5

## 2020-04-27 MED ORDER — PHENYLEPHRINE HCL-NACL 10-0.9 MG/250ML-% IV SOLN
INTRAVENOUS | Status: DC | PRN
  Administered 2020-04-27: 25 ug/min via INTRAVENOUS
  Administered 2020-04-27: 90 ug/min via INTRAVENOUS

## 2020-04-27 MED ORDER — VASOPRESSIN 20 UNIT/ML IV SOLN
INTRAVENOUS | Status: DC | PRN
Start: 2020-04-27 — End: 2020-04-27
  Administered 2020-04-27 (×2): 2 [IU] via INTRAVENOUS

## 2020-04-27 MED ORDER — ONDANSETRON HCL 4 MG/2ML IJ SOLN
INTRAMUSCULAR | Status: AC
  Filled 2020-04-27: qty 2

## 2020-04-27 MED ORDER — SODIUM CHLORIDE 0.9 % IV SOLN
INTRAVENOUS | Status: AC
  Filled 2020-04-27: qty 1.2

## 2020-04-27 MED ORDER — SODIUM CHLORIDE 0.9 % IV SOLN: INTRAVENOUS | Status: DC

## 2020-04-27 MED ORDER — PROMETHAZINE HCL 25 MG/ML IJ SOLN: 6.2500 mg | INTRAMUSCULAR | Status: DC | PRN

## 2020-04-27 MED ORDER — GLYCOPYRROLATE PF 0.2 MG/ML IJ SOSY
PREFILLED_SYRINGE | INTRAMUSCULAR | Status: DC | PRN
Start: 2020-04-27 — End: 2020-04-27
  Administered 2020-04-27: .2 mg via INTRAVENOUS

## 2020-04-27 MED ORDER — GUAIFENESIN-DM 100-10 MG/5ML PO SYRP: 15.0000 mL | ORAL_SOLUTION | ORAL | Status: DC | PRN

## 2020-04-27 MED ORDER — LACTATED RINGERS IV SOLN: INTRAVENOUS | Status: DC | PRN

## 2020-04-27 MED ORDER — LIDOCAINE 2% (20 MG/ML) 5 ML SYRINGE
INTRAMUSCULAR | Status: DC | PRN
  Administered 2020-04-27: 80 mg via INTRAVENOUS

## 2020-04-27 MED ORDER — LIDOCAINE 2% (20 MG/ML) 5 ML SYRINGE
INTRAMUSCULAR | Status: AC
  Filled 2020-04-27: qty 5

## 2020-04-27 MED ORDER — VASOPRESSIN 20 UNIT/ML IV SOLN
INTRAVENOUS | Status: AC
  Filled 2020-04-27: qty 1

## 2020-04-27 MED ORDER — HYDRALAZINE HCL 20 MG/ML IJ SOLN: 5.0000 mg | INTRAMUSCULAR | Status: DC | PRN

## 2020-04-27 MED ORDER — PHENYLEPHRINE 40 MCG/ML (10ML) SYRINGE FOR IV PUSH (FOR BLOOD PRESSURE SUPPORT)
PREFILLED_SYRINGE | INTRAVENOUS | Status: AC
  Filled 2020-04-27: qty 10

## 2020-04-27 MED ORDER — STERILE WATER FOR IRRIGATION IR SOLN
Status: DC | PRN
  Administered 2020-04-27: 1000 mL

## 2020-04-27 MED ORDER — PHENOL 1.4 % MT LIQD: 1.0000 | OROMUCOSAL | Status: DC | PRN

## 2020-04-27 MED ORDER — ACETAMINOPHEN 325 MG RE SUPP: 325.0000 mg | RECTAL | Status: DC | PRN

## 2020-04-27 MED ORDER — ALBUTEROL SULFATE HFA 108 (90 BASE) MCG/ACT IN AERS: 2.0000 | INHALATION_SPRAY | Freq: Four times a day (QID) | RESPIRATORY_TRACT | Status: DC | PRN

## 2020-04-27 MED ORDER — POLYETHYLENE GLYCOL 3350 17 G PO PACK: 17.0000 g | PACK | Freq: Every day | ORAL | Status: DC | PRN

## 2020-04-27 MED ORDER — EPHEDRINE SULFATE-NACL 50-0.9 MG/10ML-% IV SOSY
PREFILLED_SYRINGE | INTRAVENOUS | Status: DC | PRN
  Administered 2020-04-27 (×4): 10 mg via INTRAVENOUS

## 2020-04-27 MED ORDER — HEPARIN SODIUM (PORCINE) 1000 UNIT/ML IJ SOLN
INTRAMUSCULAR | Status: AC
  Filled 2020-04-27: qty 1

## 2020-04-27 MED ORDER — ASPIRIN EC 81 MG PO TBEC
81.0000 mg | DELAYED_RELEASE_TABLET | Freq: Every day | ORAL | Status: DC
  Administered 2020-04-27 – 2020-04-28 (×2): 81 mg via ORAL
  Filled 2020-04-27 (×2): qty 1

## 2020-04-27 MED ORDER — CLOPIDOGREL BISULFATE 75 MG PO TABS
75.0000 mg | ORAL_TABLET | Freq: Every day | ORAL | Status: DC
  Administered 2020-04-27 – 2020-04-28 (×2): 75 mg via ORAL
  Filled 2020-04-27 (×2): qty 1

## 2020-04-27 MED ORDER — PREGABALIN 75 MG PO CAPS
225.0000 mg | ORAL_CAPSULE | Freq: Three times a day (TID) | ORAL | Status: DC
  Administered 2020-04-27 – 2020-04-28 (×3): 225 mg via ORAL
  Filled 2020-04-27 (×3): qty 3

## 2020-04-27 MED ORDER — DEXAMETHASONE SODIUM PHOSPHATE 10 MG/ML IJ SOLN
INTRAMUSCULAR | Status: AC
  Filled 2020-04-27: qty 1

## 2020-04-27 MED ORDER — MIDAZOLAM HCL 5 MG/5ML IJ SOLN
INTRAMUSCULAR | Status: DC | PRN
  Administered 2020-04-27: 2 mg via INTRAVENOUS

## 2020-04-27 MED ORDER — DEXAMETHASONE SODIUM PHOSPHATE 10 MG/ML IJ SOLN
INTRAMUSCULAR | Status: DC | PRN
  Administered 2020-04-27: 4 mg via INTRAVENOUS

## 2020-04-27 MED ORDER — SODIUM CHLORIDE 0.9 % IV SOLN
INTRAVENOUS | Status: DC | PRN
  Administered 2020-04-27: 500 mL

## 2020-04-27 MED ORDER — INSULIN ASPART 100 UNIT/ML ~~LOC~~ SOLN
0.0000 [IU] | Freq: Three times a day (TID) | SUBCUTANEOUS | Status: DC
  Administered 2020-04-27: 3 [IU] via SUBCUTANEOUS
  Administered 2020-04-27: 5 [IU] via SUBCUTANEOUS
  Administered 2020-04-28: 3 [IU] via SUBCUTANEOUS

## 2020-04-27 MED ORDER — HEPARIN SODIUM (PORCINE) 1000 UNIT/ML IJ SOLN
INTRAMUSCULAR | Status: DC | PRN
  Administered 2020-04-27: 9000 [IU] via INTRAVENOUS

## 2020-04-27 MED ORDER — OXYCODONE-ACETAMINOPHEN 5-325 MG PO TABS
1.0000 | ORAL_TABLET | ORAL | Status: DC | PRN
  Administered 2020-04-27 – 2020-04-28 (×5): 2 via ORAL
  Filled 2020-04-27 (×5): qty 2

## 2020-04-27 MED ORDER — CLONAZEPAM 0.5 MG PO TABS
0.5000 mg | ORAL_TABLET | Freq: Three times a day (TID) | ORAL | Status: DC | PRN
  Administered 2020-04-28: 0.5 mg via ORAL
  Filled 2020-04-27: qty 1

## 2020-04-27 MED ORDER — 0.9 % SODIUM CHLORIDE (POUR BTL) OPTIME
TOPICAL | Status: DC | PRN
  Administered 2020-04-27: 2000 mL
  Administered 2020-04-27: 1000 mL

## 2020-04-27 MED ORDER — SODIUM CHLORIDE 0.9 % IV SOLN
0.0125 ug/kg/min | INTRAVENOUS | Status: AC
  Administered 2020-04-27: .05 ug/kg/min via INTRAVENOUS
  Administered 2020-04-27: .1 ug/kg/min via INTRAVENOUS
  Filled 2020-04-27: qty 1000

## 2020-04-27 MED ORDER — FENTANYL CITRATE (PF) 250 MCG/5ML IJ SOLN
INTRAMUSCULAR | Status: AC
  Filled 2020-04-27: qty 5

## 2020-04-27 MED ORDER — PROPOFOL 10 MG/ML IV BOLUS
INTRAVENOUS | Status: AC
  Filled 2020-04-27: qty 40

## 2020-04-27 MED ORDER — SODIUM CHLORIDE 0.9 % IV SOLN
0.0125 ug/kg/min | INTRAVENOUS | Status: AC
  Filled 2020-04-27: qty 1000

## 2020-04-27 MED ORDER — ALBUMIN HUMAN 5 % IV SOLN: INTRAVENOUS | Status: DC | PRN

## 2020-04-27 MED ORDER — CARVEDILOL 25 MG PO TABS
25.0000 mg | ORAL_TABLET | Freq: Two times a day (BID) | ORAL | Status: DC
  Administered 2020-04-27 – 2020-04-28 (×2): 25 mg via ORAL
  Filled 2020-04-27 (×2): qty 1

## 2020-04-27 MED ORDER — LOSARTAN POTASSIUM 50 MG PO TABS
100.0000 mg | ORAL_TABLET | Freq: Every day | ORAL | Status: DC
  Administered 2020-04-28: 100 mg via ORAL
  Filled 2020-04-27: qty 2

## 2020-04-27 MED ORDER — ONDANSETRON HCL 4 MG/2ML IJ SOLN: 4.0000 mg | Freq: Four times a day (QID) | INTRAMUSCULAR | Status: DC | PRN

## 2020-04-27 MED ORDER — MORPHINE SULFATE (PF) 2 MG/ML IV SOLN
INTRAVENOUS | Status: AC
  Filled 2020-04-27: qty 1

## 2020-04-27 MED ORDER — GLYCOPYRROLATE PF 0.2 MG/ML IJ SOSY
PREFILLED_SYRINGE | INTRAMUSCULAR | Status: AC
  Filled 2020-04-27: qty 1

## 2020-04-27 MED ORDER — FLUOXETINE HCL 20 MG PO CAPS
60.0000 mg | ORAL_CAPSULE | Freq: Every day | ORAL | Status: DC
  Administered 2020-04-27 – 2020-04-28 (×2): 60 mg via ORAL
  Filled 2020-04-27 (×2): qty 3

## 2020-04-27 MED ORDER — SUGAMMADEX SODIUM 200 MG/2ML IV SOLN
INTRAVENOUS | Status: DC | PRN
  Administered 2020-04-27: 200 mg via INTRAVENOUS

## 2020-04-27 MED ORDER — NICOTINE 14 MG/24HR TD PT24
14.0000 mg | MEDICATED_PATCH | Freq: Every day | TRANSDERMAL | Status: DC
  Administered 2020-04-27: 14 mg via TRANSDERMAL
  Filled 2020-04-27: qty 1

## 2020-04-27 MED ORDER — ACETAMINOPHEN 325 MG PO TABS: 325.0000 mg | ORAL_TABLET | ORAL | Status: DC | PRN

## 2020-04-27 MED ORDER — MIDAZOLAM HCL 2 MG/2ML IJ SOLN
INTRAMUSCULAR | Status: AC
  Filled 2020-04-27: qty 2

## 2020-04-27 MED ORDER — CLONIDINE HCL 0.1 MG PO TABS
0.1000 mg | ORAL_TABLET | Freq: Two times a day (BID) | ORAL | Status: DC
  Administered 2020-04-27 – 2020-04-28 (×3): 0.1 mg via ORAL
  Filled 2020-04-27 (×3): qty 1

## 2020-04-27 MED ORDER — LIDOCAINE HCL (PF) 1 % IJ SOLN
INTRAMUSCULAR | Status: AC
  Filled 2020-04-27: qty 30

## 2020-04-27 MED ORDER — DOCUSATE SODIUM 100 MG PO CAPS
100.0000 mg | ORAL_CAPSULE | Freq: Every day | ORAL | Status: DC
  Administered 2020-04-28: 100 mg via ORAL
  Filled 2020-04-27: qty 1

## 2020-04-27 MED ORDER — METOPROLOL TARTRATE 5 MG/5ML IV SOLN: 2.0000 mg | INTRAVENOUS | Status: DC | PRN

## 2020-04-27 MED ORDER — MORPHINE SULFATE (PF) 2 MG/ML IV SOLN
1.0000 mg | INTRAVENOUS | Status: DC | PRN
  Administered 2020-04-27 (×2): 2 mg via INTRAVENOUS

## 2020-04-27 MED ORDER — BISACODYL 10 MG RE SUPP: 10.0000 mg | Freq: Every day | RECTAL | Status: DC | PRN

## 2020-04-27 MED ORDER — PANTOPRAZOLE SODIUM 40 MG PO TBEC
40.0000 mg | DELAYED_RELEASE_TABLET | Freq: Every day | ORAL | Status: DC
  Administered 2020-04-27 – 2020-04-28 (×2): 40 mg via ORAL
  Filled 2020-04-27 (×2): qty 1

## 2020-04-27 MED ORDER — CEFAZOLIN SODIUM-DEXTROSE 2-4 GM/100ML-% IV SOLN
2.0000 g | Freq: Three times a day (TID) | INTRAVENOUS | Status: AC
  Administered 2020-04-27 – 2020-04-28 (×2): 2 g via INTRAVENOUS
  Filled 2020-04-27 (×2): qty 100

## 2020-04-27 MED ORDER — POTASSIUM CHLORIDE CRYS ER 20 MEQ PO TBCR: 20.0000 meq | EXTENDED_RELEASE_TABLET | Freq: Every day | ORAL | Status: DC | PRN

## 2020-04-27 MED ORDER — ONDANSETRON HCL 4 MG/2ML IJ SOLN
INTRAMUSCULAR | Status: DC | PRN
  Administered 2020-04-27: 4 mg via INTRAVENOUS

## 2020-04-27 MED ORDER — FENTANYL CITRATE (PF) 100 MCG/2ML IJ SOLN
INTRAMUSCULAR | Status: AC
  Filled 2020-04-27: qty 2

## 2020-04-27 MED ORDER — ISOSORBIDE MONONITRATE ER 60 MG PO TB24
60.0000 mg | ORAL_TABLET | Freq: Every day | ORAL | Status: DC
  Administered 2020-04-28: 60 mg via ORAL
  Filled 2020-04-27: qty 1

## 2020-04-27 MED ORDER — INSULIN GLARGINE (2 UNIT DIAL) 300 UNIT/ML ~~LOC~~ SOPN: 110.0000 [IU] | PEN_INJECTOR | Freq: Every morning | SUBCUTANEOUS | Status: DC

## 2020-04-27 MED ORDER — ROCURONIUM BROMIDE 10 MG/ML (PF) SYRINGE
PREFILLED_SYRINGE | INTRAVENOUS | Status: DC | PRN
  Administered 2020-04-27: 20 mg via INTRAVENOUS
  Administered 2020-04-27: 60 mg via INTRAVENOUS
  Administered 2020-04-27 (×3): 20 mg via INTRAVENOUS

## 2020-04-27 MED ORDER — CEFAZOLIN SODIUM-DEXTROSE 2-4 GM/100ML-% IV SOLN
2.0000 g | INTRAVENOUS | Status: AC
  Administered 2020-04-27: 09:00:00 2 g via INTRAVENOUS
  Filled 2020-04-27: qty 100

## 2020-04-27 MED ORDER — LACTATED RINGERS IV SOLN
INTRAVENOUS | Status: DC | PRN
Start: 2020-04-27 — End: 2020-04-27

## 2020-04-27 MED ORDER — FENTANYL CITRATE (PF) 100 MCG/2ML IJ SOLN
25.0000 ug | INTRAMUSCULAR | Status: DC | PRN
  Administered 2020-04-27: 50 ug via INTRAVENOUS

## 2020-04-27 MED ORDER — ALBUTEROL SULFATE (2.5 MG/3ML) 0.083% IN NEBU: 2.5000 mg | INHALATION_SOLUTION | Freq: Four times a day (QID) | RESPIRATORY_TRACT | Status: DC | PRN

## 2020-04-27 MED ORDER — EPHEDRINE 5 MG/ML INJ
INTRAVENOUS | Status: AC
  Filled 2020-04-27: qty 10

## 2020-04-27 SURGICAL SUPPLY — 44 items
ADH SKN CLS APL DERMABOND .7 (GAUZE/BANDAGES/DRESSINGS) ×1
CANISTER SUCT 3000ML PPV (MISCELLANEOUS) ×3 IMPLANT
CANNULA VESSEL 3MM 2 BLNT TIP (CANNULA) ×6 IMPLANT
CATH ROBINSON RED A/P 18FR (CATHETERS) ×3 IMPLANT
CLIP LIGATING EXTRA MED SLVR (CLIP) ×3 IMPLANT
CLIP LIGATING EXTRA SM BLUE (MISCELLANEOUS) ×3 IMPLANT
COVER WAND RF STERILE (DRAPES) ×1 IMPLANT
DECANTER SPIKE VIAL GLASS SM (MISCELLANEOUS) IMPLANT
DERMABOND ADVANCED (GAUZE/BANDAGES/DRESSINGS) ×2
DERMABOND ADVANCED .7 DNX12 (GAUZE/BANDAGES/DRESSINGS) ×1 IMPLANT
DRAIN HEMOVAC 1/8 X 5 (WOUND CARE) IMPLANT
ELECT REM PT RETURN 9FT ADLT (ELECTROSURGICAL) ×3
ELECTRODE REM PT RTRN 9FT ADLT (ELECTROSURGICAL) ×1 IMPLANT
EVACUATOR SILICONE 100CC (DRAIN) IMPLANT
GLOVE BIOGEL PI IND STRL 6.5 (GLOVE) IMPLANT
GLOVE BIOGEL PI INDICATOR 6.5 (GLOVE) ×2
GLOVE SS BIOGEL STRL SZ 7.5 (GLOVE) ×1 IMPLANT
GLOVE SUPERSENSE BIOGEL SZ 7.5 (GLOVE) ×2
GOWN STRL REUS W/ TWL LRG LVL3 (GOWN DISPOSABLE) ×3 IMPLANT
GOWN STRL REUS W/TWL LRG LVL3 (GOWN DISPOSABLE) ×9
GRAFT VASC PATCH XENOSURE 1X14 (Vascular Products) ×2 IMPLANT
KIT BASIN OR (CUSTOM PROCEDURE TRAY) ×3 IMPLANT
KIT SHUNT ARGYLE CAROTID ART 6 (VASCULAR PRODUCTS) IMPLANT
KIT TURNOVER KIT B (KITS) ×3 IMPLANT
NEEDLE 22X1 1/2 (OR ONLY) (NEEDLE) IMPLANT
NS IRRIG 1000ML POUR BTL (IV SOLUTION) ×8 IMPLANT
PACK CAROTID (CUSTOM PROCEDURE TRAY) ×3 IMPLANT
PAD ARMBOARD 7.5X6 YLW CONV (MISCELLANEOUS) ×6 IMPLANT
POSITIONER HEAD DONUT 9IN (MISCELLANEOUS) ×3 IMPLANT
SHUNT CAROTID BYPASS 10 (VASCULAR PRODUCTS) ×2 IMPLANT
SHUNT CAROTID BYPASS 12FRX15.5 (VASCULAR PRODUCTS) IMPLANT
SLEEVE SURGEON STRL (DRAPES) ×2 IMPLANT
SUT ETHILON 3 0 PS 1 (SUTURE) IMPLANT
SUT PROLENE 5 0 C 1 24 (SUTURE) ×2 IMPLANT
SUT PROLENE 6 0 CC (SUTURE) ×5 IMPLANT
SUT SILK 3 0 (SUTURE)
SUT SILK 3-0 18XBRD TIE 12 (SUTURE) IMPLANT
SUT VIC AB 3-0 SH 27 (SUTURE) ×6
SUT VIC AB 3-0 SH 27X BRD (SUTURE) ×2 IMPLANT
SUT VIC AB 4-0 PS2 18 (SUTURE) ×2 IMPLANT
SUT VICRYL 4-0 PS2 18IN ABS (SUTURE) ×1 IMPLANT
SYR CONTROL 10ML LL (SYRINGE) IMPLANT
TOWEL GREEN STERILE (TOWEL DISPOSABLE) ×3 IMPLANT
WATER STERILE IRR 1000ML POUR (IV SOLUTION) ×3 IMPLANT

## 2020-04-27 NOTE — Interval H&P Note (Signed)
History and Physical Interval Note:  04/27/2020 8:09 AM  Erika [name]  has presented today for surgery, with the diagnosis of BILATERAL CAROTID ARTERY STENOSIS.  The various methods of treatment have been discussed with the patient and family. After consideration of risks, benefits and other options for treatment, the patient has consented to  Procedure(s): REDO OF RIGHT ENDARTERECTOMY CAROTID (Right) as a surgical intervention.  The patient's history has been reviewed, patient examined, no change in status, stable for surgery.  I have reviewed the patient's chart and labs.  Questions were answered to the patient's satisfaction.     Curt Jews

## 2020-04-27 NOTE — Anesthesia Postprocedure Evaluation (Signed)
Anesthesia Post Note  Patient: Erika [name]  Procedure(s) Performed: REDO OF RIGHT ENDARTERECTOMY CAROTID (Right )     Patient location during evaluation: PACU Anesthesia Type: General Level of consciousness: awake and alert Pain management: pain level controlled Vital Signs Assessment: post-procedure vital signs reviewed and stable Respiratory status: spontaneous breathing, nonlabored ventilation, respiratory function stable and patient connected to nasal cannula oxygen Cardiovascular status: blood pressure returned to baseline and stable Postop Assessment: no apparent nausea or vomiting Anesthetic complications: no    Last Vitals:  Vitals:   04/27/20 1415 04/27/20 1430  BP: (!) 141/77 133/87  Pulse: 83 83  Resp: (!) 23 16  Temp:  36.8 C  SpO2: 97% 94%    Last Pain:  Vitals:   04/27/20 1430  TempSrc: Oral  PainSc:                  Catalina Gravel

## 2020-04-27 NOTE — Progress Notes (Signed)
  Day of Surgery Note    Subjective: Pain controlled with oral medication.  Requesting nicotine patch   Vitals:   04/27/20 1415 04/27/20 1430  BP: (!) 141/77 133/87  Pulse: 83 83  Resp: (!) 23 16  Temp:  98.3 F (36.8 C)  SpO2: 97% 94%    Incisions:   Right neck incision: Soft, no significant edema, well approximated Extremities: Moving all extremities well. Cardiac: Rate and rhythm are regular Lungs: Nonlabored Neurologic: Her face has some natural asymmetry however motor function intact, tongue midline.  Alert and oriented x4   Assessment/Plan:  This is a 49 y.o. female who is s/p redo right carotid endarterectomy with bovine pericardium patch angioplasty.  Hemodynamically stable and neurologically intact.  Will order nicotine patch 14 mg  - Risa Grill, PA-C 04/27/2020 3:21 PM [phone]

## 2020-04-27 NOTE — Anesthesia Procedure Notes (Signed)
Procedure Name: Intubation Date/Time: 04/27/2020 8:33 AM Performed by: Colin Benton, CRNA Pre-anesthesia Checklist: Patient identified, Emergency Drugs available, Suction available and Patient being monitored Patient Re-evaluated:Patient Re-evaluated prior to induction Oxygen Delivery Method: Circle system utilized Preoxygenation: Pre-oxygenation with 100% oxygen Induction Type: IV induction Ventilation: Mask ventilation without difficulty Laryngoscope Size: Miller and 2 Grade View: Grade I Tube type: Oral Tube size: 7.0 mm Number of attempts: 1 Airway Equipment and Method: Stylet Placement Confirmation: ETT inserted through vocal cords under direct vision,  positive ETCO2 and breath sounds checked- equal and bilateral Secured at: 21 cm Tube secured with: Tape Dental Injury: Teeth and Oropharynx as per pre-operative assessment  Comments: Easy BMV.  DL x1 with Mil 2.  Grade 1 view. No change in dentition or soft tissue.

## 2020-04-27 NOTE — OR Nursing (Signed)
PATIENT FOLLOWING COMMANDS AND MOVING ALL FOUR EXTREMITIES EQUALLY.

## 2020-04-27 NOTE — Progress Notes (Signed)
Pt received from PACU to 4E24. Oriented to room and call bell. CHG wipes applied. Tele box connected to patient. CCMD called. VSS. Call bell in reach. Will continue to monitor.  Arletta Bale, RN

## 2020-04-27 NOTE — OR Nursing (Signed)
PATIENT'S RIGHT UPPER TOOTH NOTED TO SLOWLY DETACH AND FALL OUT.  RETRIEVED PER CRNA AND PLACED INTO LAB BAG FOR PATIENT. SURGEON MADE AWARE. NO BLEEDING NOTED AT SITE. PATIENT STATED PRIOR TO SURGERY THAT HER TEETH WERE IN BAD SHAPE.

## 2020-04-27 NOTE — Transfer of Care (Signed)
Immediate Anesthesia Transfer of Care Note  Patient: Erika [name]  Procedure(s) Performed: REDO OF RIGHT ENDARTERECTOMY CAROTID (Right )  Patient Location: PACU  Anesthesia Type:General  Level of Consciousness: awake, alert , oriented and patient cooperative  Airway & Oxygen Therapy: Patient Spontanous Breathing and Patient connected to face mask oxygen  Post-op Assessment: Report given to RN, Post -op Vital signs reviewed and stable, Patient moving all extremities X 4 and Patient able to stick tongue midline  Post vital signs: Reviewed and stable  Last Vitals:  Vitals Value Taken Time  BP 136/76 04/27/20 1126  Temp 37 C 04/27/20 1126  Pulse 92 04/27/20 1131  Resp 21 04/27/20 1131  SpO2 95 % 04/27/20 1131  Vitals shown include unvalidated device data.  Last Pain:  Vitals:   04/27/20 0713  TempSrc:   PainSc: 4       Patients Stated Pain Goal: 2 ([id number])  Complications: No apparent anesthesia complications

## 2020-04-27 NOTE — Anesthesia Procedure Notes (Signed)
Arterial Line Insertion Start/End5/04/2020 8:00 AM, 04/27/2020 8:05 AM Performed by: Effie Berkshire, MD  Patient location: Pre-op. Preanesthetic checklist: patient identified, IV checked, site marked, risks and benefits discussed, surgical consent, monitors and equipment checked, pre-op evaluation, timeout performed and anesthesia consent Lidocaine 1% used for infiltration Right, radial was placed Catheter size: 20 Fr Hand hygiene performed  and maximum sterile barriers used   Attempts: 1 Procedure performed without using ultrasound guided technique. Following insertion, dressing applied. Post procedure assessment: normal and unchanged  Patient tolerated the procedure well with no immediate complications.

## 2020-04-27 NOTE — Op Note (Signed)
    OPERATIVE REPORT  DATE OF SURGERY: 04/27/2020  PATIENT: Erika [name], 49 y.o. female MRN: [id number]  DOB: 01-23-1971  PRE-OPERATIVE DIAGNOSIS: Recurrent right carotid stenosis, asymptomatic  POST-OPERATIVE DIAGNOSIS:  Same  PROCEDURE: Redo right carotid endarterectomy and bovine pericardial patch  SURGEON:  Curt Jews, M.D.  PHYSICIAN ASSISTANT: Risa Grill PA-C  ANESTHESIA: General  EBL: per anesthesia record  Total I/O In: 1250 [I.V.:1000; IV Piggyback:250] Out: 100 [Blood:100]  BLOOD ADMINISTERED: none  DRAINS: none  SPECIMEN: none  COUNTS CORRECT:  YES  PATIENT DISPOSITION:  PACU - hemodynamically stable  PROCEDURE DETAILS: Patient was taken to room placed to position where the area of the right neck prepped draped in sterile fashion.  Incision was made through the prior incision carried down through the platysma with electrocautery.  The patient did have dense scarring present in the old site.  The vagus nerve was gently densely adherent to the old endarterectomy.  The vagus nerve was carefully dissected free.  The common carotid artery was encircled below the level of the old patch.  The external carotid was encircled with a blue vessel loop and the internal carotid was encircled with a Rummel tourniquet umbilical tape.  Patient was given 9000 intravenous heparin affect circulation time the internal/external common carotid arteries were occluded.  The patch was reopened and there was no evidence of stenosis in the proximal, common carotid artery but critical subtotal occlusion with very friable material in the carotid bulb extending up into the internal carotid artery.  The artery above the patch was normal.  A 10 shunt was passed up the internal carotid allowed to backbleed and then down the common carotid were secured with Rummel tourniquet.  The external carotid was chronically occluded.  The endarterectomy was gone on the common carotid artery and the duct  was divided proximally with Potts scissors.  The endarterectomy extended onto the bifurcation and internal carotid artery.  The old Dacron patch was removed in its entirety.  Remaining atheromatous debris was removed endarterectomy plane.  A bovine pericardial patch was brought onto the field cut the appropriate dimension was sewn as a patch angioplasty with a running 6-0 Prolene suture.  Prior to completion of the closure the shunt was removed and the usual flushing maneuvers were undertaken.  Anastomosis completed and flow was restored to the internal carotid artery.  Excellent flow characteristics were noted with hand-held Doppler in the internal carotid artery.  The patient was given 50 mg of protamine to reverse heparin.  Wounds irrigated with saline.  Hemostasis left cautery.  Wounds were closed with 3-0 Vicryl to close the sternocleidomastoid over the carotid.  The platysma was closed with a running 3-0 Vicryl suture and the skin was closed with 4 subcuticular Vicryl stitch.  The patient was awakened in the operating room hemodynamically stable and neurologically intact and was transferred to the recovery room stable   Rosetta Posner, M.D., [facility] 04/27/2020 11:29 AM

## 2020-04-28 ENCOUNTER — Encounter: Payer: Self-pay | Admitting: *Deleted

## 2020-04-28 LAB — CBC
HCT: 42.7 % (ref 36.0–46.0)
Hemoglobin: 13.8 g/dL (ref 12.0–15.0)
MCH: 28.8 pg (ref 26.0–34.0)
MCHC: 32.3 g/dL (ref 30.0–36.0)
MCV: 89 fL (ref 80.0–100.0)
Platelets: 370 10*3/uL (ref 150–400)
RBC: 4.8 MIL/uL (ref 3.87–5.11)
RDW: 14 % (ref 11.5–15.5)
WBC: 15.6 10*3/uL — ABNORMAL HIGH (ref 4.0–10.5)
nRBC: 0 % (ref 0.0–0.2)

## 2020-04-28 LAB — BASIC METABOLIC PANEL
Anion gap: 11 (ref 5–15)
BUN: 9 mg/dL (ref 6–20)
CO2: 27 mmol/L (ref 22–32)
Calcium: 9.9 mg/dL (ref 8.9–10.3)
Chloride: 97 mmol/L — ABNORMAL LOW (ref 98–111)
Creatinine, Ser: 0.64 mg/dL (ref 0.44–1.00)
GFR calc Af Amer: >60 mL/min (ref >60)
GFR calc non Af Amer: >60 mL/min (ref >60)
Glucose, Bld: 168 mg/dL — ABNORMAL HIGH (ref 70–99)
Potassium: 4.1 mmol/L (ref 3.5–5.1)
Sodium: 135 mmol/L (ref 135–145)

## 2020-04-28 LAB — GLUCOSE, CAPILLARY: Glucose-Capillary: 182 mg/dL — ABNORMAL HIGH (ref 70–99)

## 2020-04-28 MED ORDER — OXYCODONE-ACETAMINOPHEN 10-325 MG PO TABS: 1.0000 | ORAL_TABLET | Freq: Four times a day (QID) | ORAL | 0 refills | Status: DC | PRN

## 2020-04-28 NOTE — Progress Notes (Addendum)
  Post-op day 1   Subjective: Pain controlled with oral medication. No compaints Husband and daughter at bedside  Vitals:   04/28/20 0013 04/28/20 0440  BP: 113/60 116/73  Pulse: 62 60  Resp: 19 15  Temp: 98.2 F (36.8 C) 97.7 F (36.5 C)  SpO2: 99% 97%    Incisions:   Right neck incision: Soft, no significant edema, well approximated Extremities: Moving all extremities well. Cardiac: Rate and rhythm are regular Lungs: Nonlabored Neurologic: Her face has some natural asymmetry however motor function intact, tongue midline.  Alert and oriented x4   Assessment/Plan:  This is a 49 y.o. female who is s/p redo right carotid endarterectomy with bovine pericardium patch angioplasty.  Hemodynamically stable and neurologically intact.   Hgb stable DC home Discuss post-op incision care, s/sx of hematoma/stroke - Risa Grill, PA-C 04/28/2020 7:46 AM [phone]  I have examined the patient, reviewed and agree with above.  Curt Jews, MD 04/28/2020 9:06 AM

## 2020-04-28 NOTE — Discharge Instructions (Signed)
   [facility] [place]  Discharge Instructions   Carotid Endarterectomy (CEA)  Please refer to the following instructions for your post-procedure care. Your surgeon or physician assistant will discuss any changes with you.  Activity  You are encouraged to walk as much as you can. You can slowly return to normal activities but must avoid strenuous activity and heavy lifting until your doctor tell you it's OK. Avoid activities such as vacuuming or swinging a golf club. You can drive after one week if you are comfortable and you are no longer taking prescription pain medications. It is normal to feel tired for serval weeks after your surgery. It is also normal to have difficulty with sleep habits, eating, and bowel movements after surgery. These will go away with time.  Bathing/Showering  You may shower after you come home. Do not soak in a bathtub, hot tub, or swim until the incision heals completely.  Incision Care  Shower every day. Clean your incision with mild soap and water. Pat the area dry with a clean towel. You do not need a bandage unless otherwise instructed. Do not apply any ointments or creams to your incision. You may have skin glue on your incision. Do not peel it off. It will come off on its own in about one week. Your incision may feel thickened and raised for several weeks after your surgery. This is normal and the skin will soften over time. For Men Only: It's OK to shave around the incision but do not shave the incision itself for 2 weeks. It is common to have numbness under your chin that could last for several months.  Diet  Resume your normal diet. There are no special food restrictions following this procedure. A low fat/low cholesterol diet is recommended for all patients with vascular disease. In order to heal from your surgery, it is CRITICAL to get adequate nutrition. Your body requires vitamins, minerals, and protein. Vegetables are the best  source of vitamins and minerals. Vegetables also provide the perfect balance of protein. Processed food has little nutritional value, so try to avoid this.        Medications  Resume taking all of your medications unless your doctor or physician assistant tells you not to. If your incision is causing pain, you may take over-the- counter pain relievers such as acetaminophen (Tylenol). If you were prescribed a stronger pain medication, please be aware these medications can cause nausea and constipation. Prevent nausea by taking the medication with a snack or meal. Avoid constipation by drinking plenty of fluids and eating foods with a high amount of fiber, such as fruits, vegetables, and grains. Do not take Tylenol if you are taking prescription pain medications.  Follow Up  Our office will schedule a follow up appointment 2-3 weeks following discharge.  Please call us immediately for any of the following conditions  Increased pain, redness, drainage (pus) from your incision site. Fever of 101 degrees or higher. If you should develop stroke (slurred speech, difficulty swallowing, weakness on one side of your body, loss of vision) you should call 911 and go to the nearest emergency room.  Reduce your risk of vascular disease:  Stop smoking. If you would like help call [facility] at 1-800-QUIT-NOW (1-800-784-8669) or [place] at [phone]. Manage your cholesterol Maintain a desired weight Control your diabetes Keep your blood pressure down  If you have any questions, please call the office at [phone].   

## 2020-04-28 NOTE — Discharge Summary (Signed)
Carotid Discharge Summary     Erika [name] Apr 06, 1971 49 y.o. female  [id number]  Admission Date: 04/27/2020  Discharge Date: 04/28/2020  Physician: Curt Jews, MD  Admission Diagnosis: Carotid stenosis, asymptomatic, right [I65.21]  Discharge Day services:      Hospital Course:  The patient was admitted to the hospital and taken to the operating room on 04/27/2020 and underwent right carotid endarterectomy.  The pt tolerated the procedure well and was transported to the PACU in excellent condition.   By POD 1, the pt neuro status intact.  She is moving all extremities well, face symmetrical, speech clear.  Right neck incision without hematoma.  Labs stable.  Hemodynamically stable.  Tolerating diet and voiding spontaneously.  The remainder of the hospital course consisted of increasing mobilization and increasing intake of solids without difficulty.   Recent Labs    04/28/20 0541  NA 135  K 4.1  CL 97*  CO2 27  GLUCOSE 168*  BUN 9  CALCIUM 9.9   Recent Labs    04/28/20 0541  WBC 15.6*  HGB 13.8  HCT 42.7  PLT 370   No results for input(s): INR in the last 72 hours.     Discharge Diagnosis:  Carotid stenosis, asymptomatic, right [I65.21]  Secondary Diagnosis: Patient Active Problem List   Diagnosis Date Noted   Carotid stenosis, asymptomatic, right 04/27/2020   Chronic migraine 11/03/2019   Pyelonephritis due to Escherichia coli 10/26/2019   Thrombocytosis ([place]) 09/16/2019   Erythrocytosis 09/16/2019   Well woman exam without gynecological exam 09/06/2019   Hypercalcemia 09/06/2019   Leukocytosis 09/06/2019   Polycythemia 09/06/2019   Chronic hip pain (Left) 08/27/2019   Osteoarthritis of hip (Left) 08/27/2019   Gluteal tendonitis of buttock (Left) 08/27/2019   Radial nerve palsy (Right) 07/15/2019   Neuropathy of right radial nerve 06/30/2019   Abnormal bruising 06/25/2019   Right hip pain 05/28/2019   History of  carotid endarterectomy (Right) 03/04/2019   Pain medication agreement signed 03/04/2019   Atypical facial pain (Right) 01/27/2019   Chronic ear pain (Right) 01/27/2019   Chronic jaw pain (Right) 01/27/2019   Geniculate Neuralgia (Right) 01/27/2019   Vitamin D deficiency 01/19/2019   Neurogenic pain 01/19/2019   Chronic anticoagulation (PLAVIX) 01/19/2019   Chronic pain syndrome 01/07/2019   Long term current use of opiate analgesic 01/07/2019   Long term prescription benzodiazepine use 01/07/2019   Pharmacologic therapy 01/07/2019   Disorder of skeletal system 01/07/2019   Problems influencing health status 01/07/2019   Chronic headaches (Primary Area of Pain) (Right) 01/07/2019   Opiate use 09/24/2018   Myalgia 06/24/2018   Chronic ankle pain (Bilateral) 12/27/2017   Tendinopathy of right gluteus medius 12/27/2017   Tendinopathy of gluteus medius (Left) 12/27/2017   Bilateral hip pain 12/26/2017   Other chronic pain 10/17/2017   Elevated troponin I level 05/21/2017   Major depressive disorder, recurrent episode, moderate (HCC) 11/19/2016   Insomnia 10/30/2016   Constipation 07/25/2016   S/P CABG x 4 07/06/2016   Bradycardia    CAD (coronary artery disease)    Carotid stenosis    CAD in native artery 06/29/2016   Elevated troponin 06/28/2016   Essential hypertension, malignant 06/28/2016   Tobacco abuse 06/28/2016   Essential hypertension    Malignant hypertension    Diabetes mellitus due to underlying condition, uncontrolled, with circulatory complication, with long-term current use of insulin (HCC)    Chest pain with high risk for cardiac etiology 06/27/2016   NSTEMI (non-ST  elevated myocardial infarction) ([place]) 06/27/2016   Proteinuria 03/15/2016   Renal artery stenosis ([place]) 03/15/2016   MDD (major depressive disorder) 10/17/2015   Agoraphobia with panic attacks 04/25/2015   HTN (hypertension), malignant 10/20/2013    Cluster headache 03/20/2012   HLD (hyperlipidemia) 07/26/2010   ADJUSTMENT DISORDER WITH MIXED FEATURES 07/26/2010   Past Medical History:  Diagnosis Date   Arthralgia of temporomandibular joint    CAD, multiple vessel    a. cath 06/29/16: ostLM 40%, ostLAD 40%, pLAD 95%, ost-pLCx 60%, pLCx 95%, mLCx 60%, mRCA 95%, D2 50%, LVSF nl;  b. 07/2016 CABG x 4 (LIMA->LAD, VG->Diag, VG->OM, VG->RCA); c. 08/2016 Cath: 3VD w/ 4/4 patent grafts. LAD distal to LIMA has diff dzs->Med rx.   Carotid arterial disease ([place])    a. 07/2016 s/p R CEA.   Clotting disorder ([place])    Depression    Diastolic dysfunction    a. echo 06/28/16: EF 50-55%, mild inf wall HK, GR1DD, mild MR, RV sys fxn nl, mildly dilated LA, PASP nl   Fatty liver disease, nonalcoholic 0258   History of blood transfusion    with heart surgery   HLD (hyperlipidemia)    Labile hypertension    a. prior renal ngiogram negative for RAS in 03/2016; b. catecholamines and metanephrines normal, mildly elevated renin with normal aldosterone and normal ratio in 02/2016   Myocardial infarction [facility]) 2017   Obesity    PAD (peripheral artery disease) (HCC)    in both legs   PTSD (post-traumatic stress disorder)    Tobacco abuse    In 2018 - pt cut back from 2 ppd to 0.5 ppd.- still 1/2 ppd as of 04/22/20   Type 2 diabetes mellitus ([place]) January 2017    Allergies as of 04/28/2020      Reactions   Chantix [varenicline Tartrate] Other (See Comments)   Feels "crazy" and angry      Medication List    STOP taking these medications   rosuvastatin 10 MG tablet Commonly known as: Crestor     TAKE these medications   albuterol 108 (90 Base) MCG/ACT inhaler Commonly known as: VENTOLIN HFA Inhale 2 puffs into the lungs every 6 (six) hours as needed for wheezing.   amLODipine 10 MG tablet Commonly known as: NORVASC Take 1 tablet (10 mg total) by mouth daily.   aspirin EC 81 MG tablet Take 1 tablet (81 mg total) by mouth daily.    benztropine 0.5 MG tablet Commonly known as: COGENTIN Take 1 tablet (0.5 mg total) by mouth daily. What changed: when to take this   carvedilol 25 MG tablet Commonly known as: COREG Take 1 tablet (25 mg total) by mouth 2 (two) times daily with a meal.   chlorproMAZINE 50 MG tablet Commonly known as: THORAZINE Take 1.5 tablets (75 mg total) by mouth at bedtime.   ciprofloxacin 500 MG tablet Commonly known as: Cipro Take 1 tablet (500 mg total) by mouth 2 (two) times daily.   clonazePAM 0.5 MG tablet Commonly known as: KlonoPIN Take 1 tablet (0.5 mg total) by mouth 3 (three) times daily as needed for anxiety.   cloNIDine 0.1 MG tablet Commonly known as: Catapres Take 1 tablet (0.1 mg total) by mouth 2 (two) times daily.   clopidogrel 75 MG tablet Commonly known as: PLAVIX TAKE 1 TABLET BY MOUTH EVERY DAY   cyclobenzaprine 7.5 MG tablet Commonly known as: FEXMID TAKE 1 TABLET BY MOUTH THREE TIMES A DAY AS NEEDED FOR MUSCLE SPASMS What  changed:   how much to take  how to take this  when to take this  reasons to take this  additional instructions   Farxiga 5 MG Tabs tablet Generic drug: dapagliflozin propanediol Take 5 mg by mouth daily before breakfast.   FLUoxetine HCl 60 MG Tabs Take 60 mg by mouth daily.   insulin lispro 100 UNIT/ML KwikPen Commonly known as: HumaLOG KwikPen Inject 0.03 mLs (3 Units total) into the skin 3 (three) times daily with meals. Take only if blood sugar is over 250.  Also pen needles 4/day   Insulin Syringe-Needle U-100 29G X 1/2" 0.3 ML Misc 1 each by Does not apply route 2 (two) times daily.   isosorbide mononitrate 60 MG 24 hr tablet Commonly known as: IMDUR Take 1 tablet (60 mg total) by mouth daily.   lamoTRIgine 200 MG tablet Commonly known as: LAMICTAL Take 1 tablet (200 mg total) by mouth daily.   losartan 100 MG tablet Commonly known as: COZAAR Take 1 tablet (100 mg total) by mouth daily.   magic mouthwash  Soln Take 15 mLs by mouth 3 (three) times daily as needed for mouth pain.   Magnesium 500 MG Caps Take 1 capsule (500 mg total) by mouth 2 (two) times daily at 8 am and 10 pm.   naloxone 4 MG/0.1ML Liqd nasal spray kit Commonly known as: NARCAN 4 mg (contents of 1 nasal spray) as a single dose in one nostril; may repeat every 2 to 3 minutes in alternating nostrils until medical assistance becomes available   nicotine 14 mg/24hr patch Commonly known as: Nicoderm CQ Place 1 patch (14 mg total) onto the skin daily.   nitroGLYCERIN 0.4 MG SL tablet Commonly known as: NITROSTAT Place 1 tablet (0.4 mg total) under the tongue every 5 (five) minutes as needed for chest pain.   ondansetron 4 MG tablet Commonly known as: ZOFRAN Take 1 tablet (4 mg total) by mouth daily as needed.   oxyCODONE 5 MG immediate release tablet Commonly known as: Oxy IR/ROXICODONE Take 1 tablet (5 mg total) by mouth 2 (two) times daily as needed for severe pain. Must last 30 days. What changed: Another medication with the same name was removed. Continue taking this medication, and follow the directions you see here.   oxyCODONE 5 MG immediate release tablet Commonly known as: Oxy IR/ROXICODONE Take 1 tablet (5 mg total) by mouth 2 (two) times daily as needed for severe pain. Must last 30 days. Start taking on: May 27, 2020 What changed: Another medication with the same name was removed. Continue taking this medication, and follow the directions you see here.   oxyCODONE-acetaminophen 10-325 MG tablet Commonly known as: Percocet Take 1 tablet by mouth every 6 (six) hours as needed for pain.   pregabalin 225 MG capsule Commonly known as: LYRICA Take 1 capsule (225 mg total) by mouth 3 (three) times daily.   Repatha SureClick 623 MG/ML Soaj Generic drug: Evolocumab Inject 1 pen into the skin every 14 (fourteen) days.   spironolactone 25 MG tablet Commonly known as: ALDACTONE Take 1 tablet (25 mg total) by  mouth daily.   Toujeo Max SoloStar 300 UNIT/ML Solostar Pen Generic drug: insulin glargine (2 Unit Dial) Inject 110 Units into the skin every morning. What changed: when to take this   Vitamin D3 125 MCG (5000 UT) Caps Take 1 capsule (5,000 Units total) by mouth daily with breakfast. Take along with calcium and magnesium.  Discharge Instructions:   [facility] Discharge Instructions Carotid Endarterectomy (CEA)  Please refer to the following instructions for your post-procedure care. Your surgeon or physician assistant will discuss any changes with you.  Activity  You are encouraged to walk as much as you can. You can slowly return to normal activities but must avoid strenuous activity and heavy lifting until your doctor tell you it's OK. Avoid activities such as vacuuming or swinging a golf club. You can drive after one week if you are comfortable and you are no longer taking prescription pain medications. It is normal to feel tired for serval weeks after your surgery. It is also normal to have difficulty with sleep habits, eating, and bowel movements after surgery. These will go away with time.  Bathing/Showering  You may shower after you come home. Do not soak in a bathtub, hot tub, or swim until the incision heals completely.  Incision Care  Shower every day. Clean your incision with mild soap and water. Pat the area dry with a clean towel. You do not need a bandage unless otherwise instructed. Do not apply any ointments or creams to your incision. You may have skin glue on your incision. Do not peel it off. It will come off on its own in about one week. Your incision may feel thickened and raised for several weeks after your surgery. This is normal and the skin will soften over time. For Men Only: It's OK to shave around the incision but do not shave the incision itself for 2 weeks. It is common to have numbness under your chin that could  last for several months.  Diet  Resume your normal diet. There are no special food restrictions following this procedure. A low fat/low cholesterol diet is recommended for all patients with vascular disease. In order to heal from your surgery, it is CRITICAL to get adequate nutrition. Your body requires vitamins, minerals, and protein. Vegetables are the best source of vitamins and minerals. Vegetables also provide the perfect balance of protein. Processed food has little nutritional value, so try to avoid this.  Medications  Resume taking all of your medications unless your doctor or physician assistant tells you not to.  If your incision is causing pain, you may take over-the- counter pain relievers such as acetaminophen (Tylenol). If you were prescribed a stronger pain medication, please be aware these medications can cause nausea and constipation.  Prevent nausea by taking the medication with a snack or meal. Avoid constipation by drinking plenty of fluids and eating foods with a high amount of fiber, such as fruits, vegetables, and grains. Do not take Tylenol if you are taking prescription pain medications.  Follow Up  Our office will schedule a follow up appointment 2-3 weeks following discharge.  Please call us immediately for any of the following conditions   Increased pain, redness, drainage (pus) from your incision site.  Fever of 101 degrees or higher.  If you should develop stroke (slurred speech, difficulty swallowing, weakness on one side of your body, loss of vision) you should call 911 and go to the nearest emergency room.   Reduce your risk of vascular disease:   Stop smoking. If you would like help call [facility] at 1-800-QUIT-NOW 773-218-5879) or [place] at [phone].  Manage your cholesterol  Maintain a desired weight  Control your diabetes  Keep your blood pressure down   If you have any questions, please call the office at  [phone].  Prescriptions given: Percocet #10 No Refill  Disposition: Home with family  Patient's condition: is Excellent  Follow up: 1. Dr. Donnetta Hutching in 2 weeks.   Barbie Banner PA-C [facility] [phone]   --- For [facility] Registry use ---   Modified Rankin score at D/C (0-6): 0  IV medication needed for:  1. Hypertension: No 2. Hypotension: No  Post-op Complications: No  1. Post-op CVA or TIA: No  If yes: Event classification (right eye, left eye, right cortical, left cortical, verterobasilar, other):   If yes: Timing of event (intra-op, <6 hrs post-op, >=6 hrs post-op, unknown):   2. CN injury: No  If yes: CN [name] injuried   3. Myocardial infarction: No  If yes: Dx by (EKG or clinical, Troponin):   4.  CHF: No  5.  Dysrhythmia (new): No  6. Wound infection: No  7. Reperfusion symptoms: No  8. Return to OR: No  If yes: return to OR for (bleeding, neurologic, other CEA incision, other):   Discharge medications: Statin use:  No ASA use:  Yes   Beta blocker use:  Yes ACE-Inhibitor use:  No  ARB use:  Yes CCB use: No P2Y12 Antagonist use: Yes, [ x] Plavix, '[ ]'$  Plasugrel, '[ ]'$  Ticlopinine, '[ ]'$  Ticagrelor, '[ ]'$  Other, '[ ]'$  No for medical reason, '[ ]'$  Non-compliant, '[ ]'$  Not-indicated Anti-coagulant use:  No, '[ ]'$  Warfarin, '[ ]'$  Rivaroxaban, '[ ]'$  Dabigatran,

## 2020-04-28 NOTE — Plan of Care (Signed)

## 2020-05-02 ENCOUNTER — Ambulatory Visit: Payer: BC Managed Care – PPO | Admitting: Family Medicine

## 2020-05-05 ENCOUNTER — Other Ambulatory Visit (HOSPITAL_COMMUNITY): Payer: Self-pay | Admitting: Psychiatry

## 2020-05-05 DIAGNOSIS — F4310 Post-traumatic stress disorder, unspecified: Secondary | ICD-10-CM

## 2020-05-10 ENCOUNTER — Other Ambulatory Visit: Payer: Self-pay

## 2020-05-11 ENCOUNTER — Ambulatory Visit (HOSPITAL_COMMUNITY): Payer: 59 | Admitting: Psychiatry

## 2020-05-17 ENCOUNTER — Encounter (HOSPITAL_COMMUNITY): Payer: Self-pay | Admitting: Psychiatry

## 2020-05-17 ENCOUNTER — Other Ambulatory Visit: Payer: Self-pay

## 2020-05-17 ENCOUNTER — Telehealth (INDEPENDENT_AMBULATORY_CARE_PROVIDER_SITE_OTHER): Payer: 59 | Admitting: Psychiatry

## 2020-05-17 ENCOUNTER — Encounter: Payer: Managed Care, Other (non HMO) | Admitting: Vascular Surgery

## 2020-05-17 DIAGNOSIS — F410 Panic disorder [episodic paroxysmal anxiety] without agoraphobia: Secondary | ICD-10-CM | POA: Diagnosis not present

## 2020-05-17 DIAGNOSIS — F331 Major depressive disorder, recurrent, moderate: Secondary | ICD-10-CM | POA: Diagnosis not present

## 2020-05-17 DIAGNOSIS — F4310 Post-traumatic stress disorder, unspecified: Secondary | ICD-10-CM

## 2020-05-17 MED ORDER — BENZTROPINE MESYLATE 0.5 MG PO TABS: 0.5000 mg | ORAL_TABLET | Freq: Every day | ORAL | 0 refills | Status: DC

## 2020-05-17 MED ORDER — LAMOTRIGINE 200 MG PO TABS: 200.0000 mg | ORAL_TABLET | Freq: Every day | ORAL | 0 refills | Status: DC

## 2020-05-17 MED ORDER — FLUOXETINE HCL 60 MG PO TABS: 60.0000 mg | ORAL_TABLET | Freq: Every day | ORAL | 0 refills | Status: DC

## 2020-05-17 MED ORDER — CLONAZEPAM 0.5 MG PO TABS: 0.5000 mg | ORAL_TABLET | Freq: Three times a day (TID) | ORAL | 2 refills | Status: DC | PRN

## 2020-05-17 MED ORDER — CHLORPROMAZINE HCL 50 MG PO TABS: 75.0000 mg | ORAL_TABLET | Freq: Every day | ORAL | 0 refills | Status: DC
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
## 2020-05-20 ENCOUNTER — Telehealth (HOSPITAL_COMMUNITY): Payer: Self-pay

## 2020-05-20 NOTE — Telephone Encounter (Signed)

## 2020-05-24 ENCOUNTER — Ambulatory Visit (INDEPENDENT_AMBULATORY_CARE_PROVIDER_SITE_OTHER): Payer: Self-pay | Admitting: Vascular Surgery

## 2020-05-24 ENCOUNTER — Encounter: Payer: Self-pay | Admitting: Vascular Surgery

## 2020-05-24 ENCOUNTER — Other Ambulatory Visit: Payer: Self-pay

## 2020-05-24 VITALS — BP 135/83 | HR 106 | Temp 98.3°F | Resp 20 | Ht 67.0 in | Wt 196.0 lb

## 2020-05-24 DIAGNOSIS — I739 Peripheral vascular disease, unspecified: Secondary | ICD-10-CM

## 2020-05-24 DIAGNOSIS — I6523 Occlusion and stenosis of bilateral carotid arteries: Secondary | ICD-10-CM

## 2020-05-24 NOTE — Progress Notes (Signed)
Patient name: Erika [name] MRN: [id number] DOB: 1971-03-02 Sex: female  REASON FOR VISIT: Follow-up recent redo right carotid endarterectomy  HPI: Erika [name] is a 49 y.o. female here today for follow-up.  Underwent redo right carotid endarterectomy 3 weeks ago.  She has had no postoperative difficulties.  She does report occasional voice fatigue.  Had no difficulty swallowing.  No aspiration or coughing issues.  Current Outpatient Medications  Medication Sig Dispense Refill  . aspirin EC 81 MG tablet Take 1 tablet (81 mg total) by mouth daily. 90 tablet 3  . benztropine (COGENTIN) 0.5 MG tablet Take 1 tablet (0.5 mg total) by mouth at bedtime. 90 tablet 0  . carvedilol (COREG) 25 MG tablet Take 1 tablet (25 mg total) by mouth 2 (two) times daily with a meal. 60 tablet 11  . chlorproMAZINE (THORAZINE) 50 MG tablet Take 1.5 tablets (75 mg total) by mouth at bedtime. 135 tablet 0  . Cholecalciferol (VITAMIN D3) 125 MCG (5000 UT) CAPS Take 1 capsule (5,000 Units total) by mouth daily with breakfast. Take along with calcium and magnesium. 90 capsule 1  . clonazePAM (KLONOPIN) 0.5 MG tablet Take 1 tablet (0.5 mg total) by mouth 3 (three) times daily as needed for anxiety. 90 tablet 2  . cloNIDine (CATAPRES) 0.1 MG tablet Take 1 tablet (0.1 mg total) by mouth 2 (two) times daily. 60 tablet 3  . clopidogrel (PLAVIX) 75 MG tablet TAKE 1 TABLET BY MOUTH EVERY DAY (Patient taking differently: Take 75 mg by mouth daily. ) 30 tablet 3  . cyclobenzaprine (FEXMID) 7.5 MG tablet TAKE 1 TABLET BY MOUTH THREE TIMES A DAY AS NEEDED FOR MUSCLE SPASMS (Patient taking differently: Take 7.5 mg by mouth 3 (three) times daily as needed for muscle spasms. ) 90 tablet 11  . dapagliflozin propanediol (FARXIGA) 5 MG TABS tablet Take 5 mg by mouth daily before breakfast. 30 tablet 11  . Evolocumab (REPATHA SURECLICK) 371 MG/ML SOAJ Inject 1 pen into the skin every 14  (fourteen) days. 2 pen 11  . FLUoxetine HCl 60 MG TABS Take 60 mg by mouth daily. 90 tablet 0  . insulin glargine, 2 Unit Dial, (TOUJEO MAX SOLOSTAR) 300 UNIT/ML Solostar Pen Inject 110 Units into the skin every morning. (Patient taking differently: Inject 110 Units into the skin in the morning. ) 4 pen 11  . insulin lispro (HUMALOG KWIKPEN) 100 UNIT/ML KwikPen Inject 0.03 mLs (3 Units total) into the skin 3 (three) times daily with meals. Take only if blood sugar is over 250.  Also pen needles 4/day 15 mL 11  . Insulin Syringe-Needle U-100 29G X 1/2" 0.3 ML MISC 1 each by Does not apply route 2 (two) times daily. 30 each 0  . isosorbide mononitrate (IMDUR) 60 MG 24 hr tablet Take 1 tablet (60 mg total) by mouth daily. 90 tablet 1  . lamoTRIgine (LAMICTAL) 200 MG tablet Take 1 tablet (200 mg total) by mouth daily. 90 tablet 0  . losartan (COZAAR) 100 MG tablet Take 1 tablet (100 mg total) by mouth daily. 30 tablet 11  . magic mouthwash SOLN Take 15 mLs by mouth 3 (three) times daily as needed for mouth pain. 250 mL 1  . naloxone (NARCAN) nasal spray 4 mg/0.1 mL 4 mg (contents of 1 nasal spray) as a single dose in one nostril; may repeat every 2 to 3 minutes in alternating nostrils until medical assistance becomes available 1 kit 0  . nicotine (NICODERM CQ) 14  mg/24hr patch Place 1 patch (14 mg total) onto the skin daily. 28 patch 11  . nitroGLYCERIN (NITROSTAT) 0.4 MG SL tablet Place 1 tablet (0.4 mg total) under the tongue every 5 (five) minutes as needed for chest pain. 25 tablet 98  . ondansetron (ZOFRAN) 4 MG tablet Take 1 tablet (4 mg total) by mouth daily as needed. 20 tablet 11  . oxyCODONE (OXY IR/ROXICODONE) 5 MG immediate release tablet Take 1 tablet (5 mg total) by mouth 2 (two) times daily as needed for severe pain. Must last 30 days. 60 tablet 0  . [START ON 05/27/2020] oxyCODONE (OXY IR/ROXICODONE) 5 MG immediate release tablet Take 1 tablet (5 mg total) by mouth 2 (two) times daily as  needed for severe pain. Must last 30 days. 60 tablet 0  . oxyCODONE-acetaminophen (PERCOCET) 10-325 MG tablet Take 1 tablet by mouth every 6 (six) hours as needed for pain. 20 tablet 0  . pregabalin (LYRICA) 225 MG capsule Take 1 capsule (225 mg total) by mouth 3 (three) times daily. 90 capsule 5  . spironolactone (ALDACTONE) 25 MG tablet Take 1 tablet (25 mg total) by mouth daily. 90 tablet 3  . albuterol (PROVENTIL HFA;VENTOLIN HFA) 108 (90 Base) MCG/ACT inhaler Inhale 2 puffs into the lungs every 6 (six) hours as needed for wheezing. 1 Inhaler 0  . amLODipine (NORVASC) 10 MG tablet Take 1 tablet (10 mg total) by mouth daily. 90 tablet 3   No current facility-administered medications for this visit.     PHYSICAL EXAM: Vitals:   05/24/20 0940 05/24/20 0943  BP: (!) 151/90 135/83  Pulse: (!) 106   Resp: 20   Temp: 98.3 F (36.8 C)   SpO2: 95%   Weight: 196 lb (88.9 kg)   Height: '5\' 7"'$  (1.702 m)     GENERAL: The patient is a well-nourished female, in no acute distress. The vital signs are documented above. Right neck incision is well-healed with no bruits. Neurologically intact  MEDICAL ISSUES: Stable status post redo right carotid endarterectomy.  Her initial surgery was a combination with coronary artery bypass grafting  We had a long discussion regarding her claudication.  She continues to have severe bilateral claudication.  Also has a left foot drop.  This is been present for some time.  I suggested that she seek neurologic evaluation for this.  She did undergo prior left superficial femoral artery stenting with Dr. Fletcher Anon with Chirstina Haan failure.  Arteriogram August 2020 was reviewed and this showed occlusion of the stent and also no evidence of aortoiliac occlusive disease.  Prior arteriogram showed a 70% right superficial femoral artery stenosis.  She is quite limited by her claudication.  Explained the rationale for my recommendation to be very conservative.  At her age of 66, I  explained that she is much higher risk for lifelong amputation if she undergoes intervention.  She had what sounds like 3 months of improvement with left SFA stenting.  She has had right vein harvest for coronary bypass.  I did explain signs and symptoms of critical limb ischemia but have recommended continued walking program and attempts at smoking cessation.  We will see her again in 9 months at our [place] office for carotid duplex and ankle arm index   Rosetta Posner, MD [facility] Tel [phone] Pager [phone]

## 2020-05-27 ENCOUNTER — Ambulatory Visit: Payer: Managed Care, Other (non HMO) | Admitting: Physician Assistant

## 2020-05-27 ENCOUNTER — Ambulatory Visit: Payer: Managed Care, Other (non HMO) | Admitting: Cardiovascular Disease

## 2020-05-31 ENCOUNTER — Other Ambulatory Visit (HOSPITAL_COMMUNITY): Payer: Self-pay | Admitting: *Deleted

## 2020-05-31 ENCOUNTER — Telehealth (HOSPITAL_COMMUNITY): Payer: Self-pay | Admitting: *Deleted

## 2020-05-31 DIAGNOSIS — F4310 Post-traumatic stress disorder, unspecified: Secondary | ICD-10-CM

## 2020-05-31 MED ORDER — CHLORPROMAZINE HCL 100 MG PO TABS: 100.0000 mg | ORAL_TABLET | Freq: Every day | ORAL | 1 refills | Status: DC

## 2020-05-31 NOTE — Telephone Encounter (Signed)
She can try 75 mg. It can cause dizziness so she need to take just before going to bed.

## 2020-05-31 NOTE — Telephone Encounter (Signed)
FYI Pt called with c/o ongoing nightmares where she is being killed. These are very realistic to her and she wonders if she should change hs medication, Thorazine 75 mg. Pt states nothing has changed recently.

## 2020-06-08 ENCOUNTER — Other Ambulatory Visit: Payer: Self-pay

## 2020-06-08 ENCOUNTER — Telehealth (HOSPITAL_COMMUNITY): Payer: Self-pay

## 2020-06-08 NOTE — Telephone Encounter (Signed)
[employer] PRES[employer] C[employer] APPROVED   FLUOXETINE HCI 60MG  TABLET CASE ID# [id number] EFFECTIVE 06/08/2020 TO 06/08/2021

## 2020-06-08 NOTE — Telephone Encounter (Signed)
SPOKE [place]

## 2020-06-09 ENCOUNTER — Ambulatory Visit (INDEPENDENT_AMBULATORY_CARE_PROVIDER_SITE_OTHER): Payer: Managed Care, Other (non HMO) | Admitting: Family Medicine

## 2020-06-09 ENCOUNTER — Encounter: Payer: Self-pay | Admitting: Family Medicine

## 2020-06-09 VITALS — BP 106/64 | HR 71 | Temp 97.1°F | Ht 67.0 in | Wt 202.0 lb

## 2020-06-09 DIAGNOSIS — Z720 Tobacco use: Secondary | ICD-10-CM

## 2020-06-09 DIAGNOSIS — G629 Polyneuropathy, unspecified: Secondary | ICD-10-CM

## 2020-06-09 DIAGNOSIS — M21372 Foot drop, left foot: Secondary | ICD-10-CM | POA: Insufficient documentation

## 2020-06-09 DIAGNOSIS — K047 Periapical abscess without sinus: Secondary | ICD-10-CM

## 2020-06-09 DIAGNOSIS — I739 Peripheral vascular disease, unspecified: Secondary | ICD-10-CM

## 2020-06-09 DIAGNOSIS — G5732 Lesion of lateral popliteal nerve, left lower limb: Secondary | ICD-10-CM

## 2020-06-09 MED ORDER — AMOXICILLIN 500 MG PO CAPS: 500.0000 mg | ORAL_CAPSULE | Freq: Three times a day (TID) | ORAL | 0 refills | Status: DC

## 2020-06-09 NOTE — Progress Notes (Signed)
Established Patient Office Visit  Subjective:  Patient ID: Erika [name], female    DOB: [birth year]  Age: 49 y.o. MRN: [id number]  CC:  Chief Complaint  Patient presents with  . Transitions Of Care    toc from Dr. Deborra Medina patient would like a referral to neurologist for feet being numb a lot with swelling and tripping over feet x 6 months. Also would like refill on medications.     HPI [name] presents for evaluation and treatment of soreness in her left lower jaw molar line.  Significant history of poor dentition.  She cannot afford to have her teeth pulled at this time.  Reports paresthesias in the dorsum of both of her feet for months now.  Cannot dorsiflex her left foot.  Significant past medical history serrated severe peripheral vascular disease, poorly controlled diabetes.  Recent TSH was 1.18.  She would like to start the process of obtaining electric power chair.  She cannot walk without experiencing claudication pain for any distance whatsoever.  She requests a refill of Flexeril for muscle spasms.  Past Medical History:  Diagnosis Date  . Arthralgia of temporomandibular joint   . CAD, multiple vessel    a. cath 06/29/16: ostLM 40%, ostLAD 40%, pLAD 95%, ost-pLCx 60%, pLCx 95%, mLCx 60%, mRCA 95%, D2 50%, LVSF nl;  b. 07/2016 CABG x 4 (LIMA->LAD, VG->Diag, VG->OM, VG->RCA); c. 08/2016 Cath: 3VD w/ 4/4 patent grafts. LAD distal to LIMA has diff dzs->Med rx.  . Carotid arterial disease ([place])    a. 07/2016 s/p R CEA.  . Clotting disorder ([place])   . Depression   . Diastolic dysfunction    a. echo 06/28/16: EF 50-55%, mild inf wall HK, GR1DD, mild MR, RV sys fxn nl, mildly dilated LA, PASP nl  . Fatty liver disease, nonalcoholic 7494  . History of blood transfusion    with heart surgery  . HLD (hyperlipidemia)   . Labile hypertension    a. prior renal ngiogram negative for RAS in 03/2016; b. catecholamines and metanephrines normal, mildly elevated renin with normal  aldosterone and normal ratio in 02/2016  . Myocardial infarction ([place]) 2017  . Obesity   . PAD (peripheral artery disease) ([facility])    in both legs  . PTSD (post-traumatic stress disorder)   . Tobacco abuse    In 2018 - pt cut back from 2 ppd to 0.5 ppd.- still 1/2 ppd as of 04/22/20  . Type 2 diabetes mellitus [facility]) January 2017    Past Surgical History:  Procedure Laterality Date  . ABDOMINAL AORTOGRAM W/LOWER EXTREMITY [name] 10/15/2018   Procedure: ABDOMINAL AORTOGRAM W/LOWER EXTREMITY;  Surgeon: Wellington Hampshire, MD;  Location: [place] [facility];  Service: Cardiovascular;  Laterality: [name];  . ABDOMINAL AORTOGRAM W/LOWER EXTREMITY Bilateral 08/19/2019   Procedure: ABDOMINAL AORTOGRAM W/LOWER EXTREMITY;  Surgeon: Wellington Hampshire, MD;  Location: [place] [facility];  Service: Cardiovascular;  Laterality: Bilateral;  . CARDIAC CATHETERIZATION [name] 06/29/2016   Procedure: Left Heart Cath and Coronary Angiography;  Surgeon: Minna Merritts, MD;  Location: [place] [facility];  Service: Cardiovascular;  Laterality: [name];  . CARDIAC CATHETERIZATION [name] 08/29/2016   Procedure: Left Heart Cath and Cors/Grafts Angiography;  Surgeon: Wellington Hampshire, MD;  Location: [place] [facility];  Service: Cardiovascular;  Laterality: [name];  . CESAREAN SECTION    . CHOLECYSTECTOMY    . CORONARY ARTERY BYPASS GRAFT [name] 07/06/2016   Procedure: CORONARY ARTERY BYPASS GRAFTING (CABG) x four,  using left internal mammary artery and right leg greater saphenous vein harvested endoscopically;  Surgeon: Ivin Poot, MD;  Location: [place];  Service: Open Heart Surgery;  Laterality: [name];  . ENDARTERECTOMY Right 07/06/2016   Procedure: ENDARTERECTOMY CAROTID;  Surgeon: Rosetta Posner, MD;  Location: [place];  Service: Vascular;  Laterality: Right;  . ENDARTERECTOMY Right 04/27/2020   Procedure: REDO OF RIGHT ENDARTERECTOMY CAROTID;  Surgeon: Rosetta Posner, MD;  Location: [place];  Service: Vascular;  Laterality: Right;  . PERIPHERAL  VASCULAR CATHETERIZATION [name] 04/18/2016   Procedure: Renal Angiography;  Surgeon: Wellington Hampshire, MD;  Location: [place] [facility];  Service: Cardiovascular;  Laterality: [name];  . PERIPHERAL VASCULAR INTERVENTION Left 10/15/2018   Procedure: PERIPHERAL VASCULAR INTERVENTION;  Surgeon: Wellington Hampshire, MD;  Location: [place] [facility];  Service: Cardiovascular;  Laterality: Left;  Left superficial femoral  . TEE WITHOUT CARDIOVERSION [name] 07/06/2016   Procedure: TRANSESOPHAGEAL ECHOCARDIOGRAM (TEE);  Surgeon: Ivin Poot, MD;  Location: [place];  Service: Open Heart Surgery;  Laterality: [name];  . TONSILLECTOMY      Family History  Adopted: Yes  Problem Relation Age of Onset  . Diabetes Mother   . Diabetes Father   . Alcohol abuse Father   . Heart disease Father   . Drug abuse Father   . Stroke Sister   . Anxiety disorder Sister     Social History   Socioeconomic History  . Marital status: Married    Spouse name: Not on file  . Number of children: 1  . Years of education: 71  . Highest education level: Not on file  Occupational History  . Occupation: Disability  Tobacco Use  . Smoking status: Current Every Day Smoker    Packs/day: 0.25    Years: 27.00    Pack years: 6.75    Types: Cigarettes  . Smokeless tobacco: Never Used  Vaping Use  . Vaping Use: Former  Substance and Sexual Activity  . Alcohol use: Not Currently    Alcohol/week: 0.0 standard drinks    Comment: socially  . Drug use: No  . Sexual activity: Yes    Partners: Male    Birth control/protection: None  Other Topics Concern  . Not on file  Social History Narrative   Lives with husband   Caffeine use: Drinks 2 cups coffee per day   Right handed   Social Determinants of Health   Financial Resource Strain:   . Difficulty of Paying Living Expenses:   Food Insecurity:   . Worried About Charity fundraiser in the Last Year:   . Arboriculturist in the Last Year:   Transportation Needs:   . Lexicographer (Medical):   Marland Kitchen Lack of Transportation (Non-Medical):   Physical Activity:   . Days of Exercise per Week:   . Minutes of Exercise per Session:   Stress:   . Feeling of Stress :   Social Connections:   . Frequency of Communication with Friends and Family:   . Frequency of Social Gatherings with Friends and Family:   . Attends Religious Services:   . Active Member of Clubs or Organizations:   . Attends Archivist Meetings:   Marland Kitchen Marital Status:   Intimate Partner Violence:   . Fear of Current or Ex-Partner:   . Emotionally Abused:   Marland Kitchen Physically Abused:   . Sexually Abused:     Outpatient Medications Prior to Visit  Medication  Sig Dispense Refill  . aspirin EC 81 MG tablet Take 1 tablet (81 mg total) by mouth daily. 90 tablet 3  . benztropine (COGENTIN) 0.5 MG tablet Take 1 tablet (0.5 mg total) by mouth at bedtime. 90 tablet 0  . carvedilol (COREG) 25 MG tablet Take 1 tablet (25 mg total) by mouth 2 (two) times daily with a meal. 60 tablet 11  . chlorproMAZINE (THORAZINE) 100 MG tablet Take 1 tablet (100 mg total) by mouth at bedtime. 30 tablet 1  . clonazePAM (KLONOPIN) 0.5 MG tablet Take 1 tablet (0.5 mg total) by mouth 3 (three) times daily as needed for anxiety. 90 tablet 2  . cloNIDine (CATAPRES) 0.1 MG tablet Take 1 tablet (0.1 mg total) by mouth 2 (two) times daily. 60 tablet 3  . clopidogrel (PLAVIX) 75 MG tablet TAKE 1 TABLET BY MOUTH EVERY DAY (Patient taking differently: Take 75 mg by mouth daily. ) 30 tablet 3  . cyclobenzaprine (FEXMID) 7.5 MG tablet TAKE 1 TABLET BY MOUTH THREE TIMES A DAY AS NEEDED FOR MUSCLE SPASMS (Patient taking differently: Take 7.5 mg by mouth 3 (three) times daily as needed for muscle spasms. ) 90 tablet 11  . dapagliflozin propanediol (FARXIGA) 5 MG TABS tablet Take 5 mg by mouth daily before breakfast. 30 tablet 11  . Evolocumab (REPATHA SURECLICK) 379 MG/ML SOAJ Inject 1 pen into the skin every 14 (fourteen) days. 2 pen 11    . FLUoxetine HCl 60 MG TABS Take 60 mg by mouth daily. 90 tablet 0  . insulin glargine, 2 Unit Dial, (TOUJEO MAX SOLOSTAR) 300 UNIT/ML Solostar Pen Inject 110 Units into the skin every morning. (Patient taking differently: Inject 110 Units into the skin in the morning. ) 4 pen 11  . insulin lispro (HUMALOG KWIKPEN) 100 UNIT/ML KwikPen Inject 0.03 mLs (3 Units total) into the skin 3 (three) times daily with meals. Take only if blood sugar is over 250.  Also pen needles 4/day 15 mL 11  . Insulin Syringe-Needle U-100 29G X 1/2" 0.3 ML MISC 1 each by Does not apply route 2 (two) times daily. 30 each 0  . isosorbide mononitrate (IMDUR) 60 MG 24 hr tablet Take 1 tablet (60 mg total) by mouth daily. 90 tablet 1  . lamoTRIgine (LAMICTAL) 200 MG tablet Take 1 tablet (200 mg total) by mouth daily. 90 tablet 0  . losartan (COZAAR) 100 MG tablet Take 1 tablet (100 mg total) by mouth daily. 30 tablet 11  . magic mouthwash SOLN Take 15 mLs by mouth 3 (three) times daily as needed for mouth pain. 250 mL 1  . nicotine (NICODERM CQ) 14 mg/24hr patch Place 1 patch (14 mg total) onto the skin daily. 28 patch 11  . nitroGLYCERIN (NITROSTAT) 0.4 MG SL tablet Place 1 tablet (0.4 mg total) under the tongue every 5 (five) minutes as needed for chest pain. 25 tablet 98  . ondansetron (ZOFRAN) 4 MG tablet Take 1 tablet (4 mg total) by mouth daily as needed. 20 tablet 11  . oxyCODONE (OXY IR/ROXICODONE) 5 MG immediate release tablet Take 1 tablet (5 mg total) by mouth 2 (two) times daily as needed for severe pain. Must last 30 days. 60 tablet 0  . pregabalin (LYRICA) 225 MG capsule Take 1 capsule (225 mg total) by mouth 3 (three) times daily. 90 capsule 5  . spironolactone (ALDACTONE) 25 MG tablet Take 1 tablet (25 mg total) by mouth daily. 90 tablet 3  . albuterol (PROVENTIL HFA;VENTOLIN HFA)  108 (90 Base) MCG/ACT inhaler Inhale 2 puffs into the lungs every 6 (six) hours as needed for wheezing. 1 Inhaler 0  . amLODipine  (NORVASC) 10 MG tablet Take 1 tablet (10 mg total) by mouth daily. 90 tablet 3  . Cholecalciferol (VITAMIN D3) 125 MCG (5000 UT) CAPS Take 1 capsule (5,000 Units total) by mouth daily with breakfast. Take along with calcium and magnesium. (Patient not taking: Reported on 06/09/2020) 90 capsule 1  . naloxone (NARCAN) nasal spray 4 mg/0.1 mL 4 mg (contents of 1 nasal spray) as a single dose in one nostril; may repeat every 2 to 3 minutes in alternating nostrils until medical assistance becomes available (Patient not taking: Reported on 06/09/2020) 1 kit 0  . oxyCODONE (OXY IR/ROXICODONE) 5 MG immediate release tablet Take 1 tablet (5 mg total) by mouth 2 (two) times daily as needed for severe pain. Must last 30 days. 60 tablet 0  . oxyCODONE-acetaminophen (PERCOCET) 10-325 MG tablet Take 1 tablet by mouth every 6 (six) hours as needed for pain. (Patient not taking: Reported on 06/09/2020) 20 tablet 0   No facility-administered medications prior to visit.    Allergies  Allergen Reactions  . Chantix [Varenicline Tartrate] Other (See Comments)    Feels "crazy" and angry     ROS Review of Systems  Constitutional: Negative.   Respiratory: Negative.   Cardiovascular: Negative.   Gastrointestinal: Negative.   Endocrine: Negative for polyphagia and polyuria.  Genitourinary: Negative.   Musculoskeletal: Positive for gait problem and myalgias.  Skin: Negative for pallor and rash.  Neurological: Positive for weakness and numbness.      Objective:    Physical Exam Vitals and nursing note reviewed.  Constitutional:      General: She is not in acute distress.    Appearance: Normal appearance. She is not ill-appearing, toxic-appearing or diaphoretic.  HENT:     Head: Normocephalic and atraumatic.     Right Ear: Tympanic membrane, ear canal and external ear normal.     Left Ear: Tympanic membrane, ear canal and external ear normal.  Eyes:     General: No scleral icterus.       Right eye: No  discharge.        Left eye: No discharge.     Conjunctiva/sclera: Conjunctivae normal.     Pupils: Pupils are equal, round, and reactive to light.  Cardiovascular:     Rate and Rhythm: Normal rate and regular rhythm.     Pulses:          Dorsalis pedis pulses are 0 on the right side and 0 on the left side.       Posterior tibial pulses are 0 on the right side and 0 on the left side.  Pulmonary:     Effort: Pulmonary effort is normal.  Abdominal:     General: Bowel sounds are normal.  Skin:    General: Skin is warm and dry.  Neurological:     Mental Status: She is alert and oriented to person, place, and time.     Motor: Weakness present.     Comments: Cannot dorsiflex left foot.   Psychiatric:        Mood and Affect: Mood normal.        Behavior: Behavior normal.     BP 106/64   Pulse 71   Temp (!) 97.1 F (36.2 C) (Tympanic)   Ht '5\' 7"'$  (1.702 m)   Wt 202 lb (91.6 kg)   SpO2  93%   BMI 31.64 kg/m  Wt Readings from Last 3 Encounters:  06/09/20 202 lb (91.6 kg)  05/24/20 196 lb (88.9 kg)  04/27/20 193 lb (87.5 kg)     Health Maintenance Due  Topic Date Due  . COVID-19 Vaccine (1) Never done    There are no preventive care reminders to display for this patient.  Lab Results  Component Value Date   TSH 1.693 09/15/2019   Lab Results  Component Value Date   WBC 15.6 (H) 04/28/2020   HGB 13.8 04/28/2020   HCT 42.7 04/28/2020   MCV 89.0 04/28/2020   PLT 370 04/28/2020   Lab Results  Component Value Date   NA 135 04/28/2020   K 4.1 04/28/2020   CO2 27 04/28/2020   GLUCOSE 168 (H) 04/28/2020   BUN 9 04/28/2020   CREATININE 0.64 04/28/2020   BILITOT 0.5 04/22/2020   ALKPHOS 81 04/22/2020   AST 19 04/22/2020   ALT 23 04/22/2020   PROT 7.3 04/22/2020   ALBUMIN 4.1 04/22/2020   CALCIUM 9.9 04/28/2020   ANIONGAP 11 04/28/2020   GFR 132.97 09/07/2019   Lab Results  Component Value Date   CHOL 157 09/07/2019   Lab Results  Component Value Date   HDL  38.70 (L) 09/07/2019   Lab Results  Component Value Date   LDLCALC 80 09/07/2019   Lab Results  Component Value Date   TRIG 191.0 (H) 09/07/2019   Lab Results  Component Value Date   CHOLHDL 4 09/07/2019   Lab Results  Component Value Date   HGBA1C 9.5 (A) 04/11/2020      Assessment & Plan:   Problem List Items Addressed This Visit      Cardiovascular and Mediastinum   PVD (peripheral vascular disease) ([place])     Digestive   Dental abscess   Relevant Medications   amoxicillin (AMOXIL) 500 MG capsule     Nervous and Auditory   Neuropathy - Primary   Relevant Orders   Vitamin B12     Other   Tobacco abuse    Other Visit Diagnoses    Peroneal nerve palsy, left       Relevant Orders   Ambulatory referral to Neurology      Meds ordered this encounter  Medications  . amoxicillin (AMOXIL) 500 MG capsule    Sig: Take 1 capsule (500 mg total) by mouth 3 (three) times daily.    Dispense:  30 capsule    Refill:  0    Follow-up: No follow-ups on file.  She will let me know the name of the physical medicine and rehab doctor but she had seen in [place].  They might be able to help with the documentation for the electric wheelchair.  Cannot write for Flexeril as requested in consideration of her other medications. Libby Maw, MD

## 2020-06-10 ENCOUNTER — Other Ambulatory Visit (HOSPITAL_COMMUNITY): Payer: Self-pay | Admitting: Psychiatry

## 2020-06-10 DIAGNOSIS — F331 Major depressive disorder, recurrent, moderate: Secondary | ICD-10-CM

## 2020-06-10 LAB — VITAMIN B12: Vitamin B-12: 317 pg/mL (ref 211–911)

## 2020-06-14 ENCOUNTER — Ambulatory Visit (INDEPENDENT_AMBULATORY_CARE_PROVIDER_SITE_OTHER): Payer: Managed Care, Other (non HMO) | Admitting: Physician Assistant

## 2020-06-14 ENCOUNTER — Other Ambulatory Visit: Payer: Self-pay

## 2020-06-14 ENCOUNTER — Encounter: Payer: Self-pay | Admitting: Physician Assistant

## 2020-06-14 VITALS — BP 126/82 | HR 81 | Ht 67.0 in | Wt 199.1 lb

## 2020-06-14 DIAGNOSIS — E1151 Type 2 diabetes mellitus with diabetic peripheral angiopathy without gangrene: Secondary | ICD-10-CM

## 2020-06-14 DIAGNOSIS — I6529 Occlusion and stenosis of unspecified carotid artery: Secondary | ICD-10-CM | POA: Diagnosis not present

## 2020-06-14 DIAGNOSIS — I2510 Atherosclerotic heart disease of native coronary artery without angina pectoris: Secondary | ICD-10-CM | POA: Diagnosis not present

## 2020-06-14 DIAGNOSIS — I739 Peripheral vascular disease, unspecified: Secondary | ICD-10-CM

## 2020-06-14 DIAGNOSIS — E785 Hyperlipidemia, unspecified: Secondary | ICD-10-CM

## 2020-06-14 DIAGNOSIS — I10 Essential (primary) hypertension: Secondary | ICD-10-CM

## 2020-06-14 DIAGNOSIS — Z720 Tobacco use: Secondary | ICD-10-CM

## 2020-06-14 NOTE — Progress Notes (Signed)
Office Visit    Patient Name: Erika [name] Date of Encounter: 06/19/2020  Primary Care Provider:  Libby Maw, MD Primary Cardiologist:  [name], MD  Chief Complaint    Chief Complaint  Patient presents with  . other    4 month follow up. Meds reviewed by the pt. verbally. Pt. c/o LE edema.    49 year old female with history of CAD s/p CABG, carotid disease s/p right carotid endarterectomy, hypertension, PAD s/p stenting left SFA, tobacco use, and who presents today for follow-up of CAD and PAD.  Past Medical History    Past Medical History:  Diagnosis Date  . Arthralgia of temporomandibular joint   . CAD, multiple vessel    a. cath 06/29/16: ostLM 40%, ostLAD 40%, pLAD 95%, ost-pLCx 60%, pLCx 95%, mLCx 60%, mRCA 95%, D2 50%, LVSF nl;  b. 07/2016 CABG x 4 (LIMA->LAD, VG->Diag, VG->OM, VG->RCA); c. 08/2016 Cath: 3VD w/ 4/4 patent grafts. LAD distal to LIMA has diff dzs->Med rx.  . Carotid arterial disease ([place])    a. 07/2016 s/p R CEA.  . Clotting disorder ([place])   . Depression   . Diastolic dysfunction    a. echo 06/28/16: EF 50-55%, mild inf wall HK, GR1DD, mild MR, RV sys fxn nl, mildly dilated LA, PASP nl  . Fatty liver disease, nonalcoholic 2423  . History of blood transfusion    with heart surgery  . HLD (hyperlipidemia)   . Labile hypertension    a. prior renal ngiogram negative for RAS in 03/2016; b. catecholamines and metanephrines normal, mildly elevated renin with normal aldosterone and normal ratio in 02/2016  . Myocardial infarction ([place]) 2017  . Obesity   . PAD (peripheral artery disease) ([facility])    in both legs  . PTSD (post-traumatic stress disorder)   . Tobacco abuse    In 2018 - pt cut back from 2 ppd to 0.5 ppd.- still 1/2 ppd as of 04/22/20  . Type 2 diabetes mellitus [facility]) January 2017   Past Surgical History:  Procedure Laterality Date  . ABDOMINAL AORTOGRAM W/LOWER EXTREMITY [name] 10/15/2018   Procedure: ABDOMINAL AORTOGRAM W/LOWER  EXTREMITY;  Surgeon: Wellington Hampshire, MD;  Location: [place] CV LAB;  Service: Cardiovascular;  Laterality: [name];  . ABDOMINAL AORTOGRAM W/LOWER EXTREMITY Bilateral 08/19/2019   Procedure: ABDOMINAL AORTOGRAM W/LOWER EXTREMITY;  Surgeon: Wellington Hampshire, MD;  Location: [place] CV LAB;  Service: Cardiovascular;  Laterality: Bilateral;  . CARDIAC CATHETERIZATION [name] 06/29/2016   Procedure: Left Heart Cath and Coronary Angiography;  Surgeon: Minna Merritts, MD;  Location: [place] [facility] LAB;  Service: Cardiovascular;  Laterality: [name];  . CARDIAC CATHETERIZATION [name] 08/29/2016   Procedure: Left Heart Cath and Cors/Grafts Angiography;  Surgeon: Wellington Hampshire, MD;  Location: [place] CV LAB;  Service: Cardiovascular;  Laterality: [name];  . CESAREAN SECTION    . CHOLECYSTECTOMY    . CORONARY ARTERY BYPASS GRAFT [name] 07/06/2016   Procedure: CORONARY ARTERY BYPASS GRAFTING (CABG) x four, using left internal mammary artery and right leg greater saphenous vein harvested endoscopically;  Surgeon: Ivin Poot, MD;  Location: [place];  Service: Open Heart Surgery;  Laterality: [name];  . ENDARTERECTOMY Right 07/06/2016   Procedure: ENDARTERECTOMY CAROTID;  Surgeon: Rosetta Posner, MD;  Location: [place];  Service: Vascular;  Laterality: Right;  . ENDARTERECTOMY Right 04/27/2020   Procedure: REDO OF RIGHT ENDARTERECTOMY CAROTID;  Surgeon: Rosetta Posner, MD;  Location: [place];  Service: Vascular;  Laterality: Right;  . PERIPHERAL VASCULAR CATHETERIZATION [name] 04/18/2016   Procedure: Renal Angiography;  Surgeon: Wellington Hampshire, MD;  Location: [place] CV LAB;  Service: Cardiovascular;  Laterality: [name];  . PERIPHERAL VASCULAR INTERVENTION Left 10/15/2018   Procedure: PERIPHERAL VASCULAR INTERVENTION;  Surgeon: Wellington Hampshire, MD;  Location: [place] CV LAB;  Service: Cardiovascular;  Laterality: Left;  Left superficial femoral  . TEE WITHOUT CARDIOVERSION [name] 07/06/2016   Procedure: TRANSESOPHAGEAL  ECHOCARDIOGRAM (TEE);  Surgeon: Ivin Poot, MD;  Location: [place];  Service: Open Heart Surgery;  Laterality: [name];  . TONSILLECTOMY      Allergies  Allergies  Allergen Reactions  . Chantix [Varenicline Tartrate] Other (See Comments)    Feels "crazy" and angry     History of Present Illness    Erika [name] is a 49 y.o. female with PMH as above.  She has a history of 2017 NSTEMI and found to have 3 CAD with subsequent CABG, as well as carotid disease s/p right carotid endarterectomy.  She was rehospitalized September 2017 with CP in the setting of uncontrolled hypertension.  Catheterization showed patent grafts.  LAD was noted to have diffuse disease distal to the anastomosis and very small in caliber.  EF normal with mildly elevated LVEDP.  Her blood pressure has been difficult to control and labile in the past.  Previous angiography showed no renal artery stenosis.  She has a history of intolerance to hydralazine due to GI symptoms.  She also has a history of PAD s/p stenting of the left SFA.  She was evaluated by Dr. Fletcher Anon for worsening left calf claudication with drop in ABI.  Angiography 07/2019 showed no significant aortoiliac disease, patent left SFA stent, proximal portion of SFA diffusely diseased with short occlusion distal to the stent.  Additionally, anterior tibial was diffusely diseased and occluded distally.  Peroneal artery was diffusely diseased.  No revascularization performed.  She was seen 02/05/2020 and reported she had cut back to smoking a pack and 1/2/week and continue to work on smoking.  She reported stable bilateral leg pain.  She stated she was able to walk around her house once without pain but, if walking around it twice, she noted pain.  The symptoms were stable when compared with those in September.  Today, she returns to clinic and reports progressive symptoms of claudication, as well as lower extremity edema.  She reports pain with minimal ambulation.  In  addition, she reports foot drop on the left foot.  No chest pain, racing heart rate, or palpitations.  No presyncope or syncope.  No signs or symptoms of worsening heart failure.  She reports medication compliance.  Home Medications    Prior to Admission medications   Medication Sig Start Date End Date Taking? Authorizing Provider  albuterol (PROVENTIL HFA;VENTOLIN HFA) 108 (90 Base) MCG/ACT inhaler Inhale 2 puffs into the lungs every 6 (six) hours as needed for wheezing. 03/02/19 04/13/20  Lucille Passy, MD  amLODipine (NORVASC) 10 MG tablet Take 1 tablet (10 mg total) by mouth daily. 01/18/20 04/17/20  Lucille Passy, MD  amoxicillin (AMOXIL) 500 MG capsule Take 1 capsule (500 mg total) by mouth 3 (three) times daily. 06/09/20   Libby Maw, MD  aspirin EC 81 MG tablet Take 1 tablet (81 mg total) by mouth daily. 07/17/17   Theora Gianotti, NP  benztropine (COGENTIN) 0.5 MG tablet Take 1 tablet (0.5 mg total) by mouth at bedtime. 05/17/20 05/17/21  Arfeen,  Arlyce Harman, MD  carvedilol (COREG) 25 MG tablet Take 1 tablet (25 mg total) by mouth 2 (two) times daily with a meal. 01/18/20   Lucille Passy, MD  chlorproMAZINE (THORAZINE) 100 MG tablet Take 1 tablet (100 mg total) by mouth at bedtime. 05/31/20   Arfeen, Arlyce Harman, MD  Cholecalciferol (VITAMIN D3) 125 MCG (5000 UT) CAPS Take 1 capsule (5,000 Units total) by mouth daily with breakfast. Take along with calcium and magnesium. Patient not taking: Reported on 06/09/2020 04/20/20 10/17/20  Libby Maw, MD  clonazePAM (KLONOPIN) 0.5 MG tablet Take 1 tablet (0.5 mg total) by mouth 3 (three) times daily as needed for anxiety. 05/17/20   Arfeen, Arlyce Harman, MD  cloNIDine (CATAPRES) 0.1 MG tablet Take 1 tablet (0.1 mg total) by mouth 2 (two) times daily. 02/19/20   Loel Dubonnet, NP  clopidogrel (PLAVIX) 75 MG tablet TAKE 1 TABLET BY MOUTH EVERY DAY Patient taking differently: Take 75 mg by mouth daily.  02/11/20   Wellington Hampshire, MD   cyclobenzaprine (FEXMID) 7.5 MG tablet TAKE 1 TABLET BY MOUTH THREE TIMES A DAY AS NEEDED FOR MUSCLE SPASMS Patient taking differently: Take 7.5 mg by mouth 3 (three) times daily as needed for muscle spasms.  01/18/20   Lucille Passy, MD  dapagliflozin propanediol (FARXIGA) 5 MG TABS tablet Take 5 mg by mouth daily before breakfast. 01/08/20   Renato Shin, MD  Evolocumab (REPATHA SURECLICK) 409 MG/ML SOAJ Inject 1 pen into the skin every 14 (fourteen) days. 12/29/19   Wellington Hampshire, MD  FLUoxetine HCl 60 MG TABS Take 60 mg by mouth daily. 05/17/20   Arfeen, Arlyce Harman, MD  insulin glargine, 2 Unit Dial, (TOUJEO MAX SOLOSTAR) 300 UNIT/ML Solostar Pen Inject 110 Units into the skin every morning. Patient taking differently: Inject 110 Units into the skin in the morning.  04/11/20   Renato Shin, MD  insulin lispro (HUMALOG KWIKPEN) 100 UNIT/ML KwikPen Inject 0.03 mLs (3 Units total) into the skin 3 (three) times daily with meals. Take only if blood sugar is over 250.  Also pen needles 4/day 10/01/19   Renato Shin, MD  Insulin Syringe-Needle U-100 29G X 1/2" 0.3 ML MISC 1 each by Does not apply route 2 (two) times daily. 05/20/17   Nita Sells, MD  isosorbide mononitrate (IMDUR) 60 MG 24 hr tablet Take 1 tablet (60 mg total) by mouth daily. 02/05/20 08/03/20  Loel Dubonnet, NP  lamoTRIgine (LAMICTAL) 200 MG tablet Take 1 tablet (200 mg total) by mouth daily. 05/17/20   Arfeen, Arlyce Harman, MD  losartan (COZAAR) 100 MG tablet Take 1 tablet (100 mg total) by mouth daily. 01/18/20   Lucille Passy, MD  magic mouthwash SOLN Take 15 mLs by mouth 3 (three) times daily as needed for mouth pain. 01/18/20   Lucille Passy, MD  naloxone [facility]) nasal spray 4 mg/0.1 mL 4 mg (contents of 1 nasal spray) as a single dose in one nostril; may repeat every 2 to 3 minutes in alternating nostrils until medical assistance becomes available Patient not taking: Reported on 06/09/2020 12/25/18   Lucille Passy, MD  nicotine  (NICODERM CQ) 14 mg/24hr patch Place 1 patch (14 mg total) onto the skin daily. 01/18/20   Lucille Passy, MD  nitroGLYCERIN (NITROSTAT) 0.4 MG SL tablet Place 1 tablet (0.4 mg total) under the tongue every 5 (five) minutes as needed for chest pain. 01/06/20   Lucille Passy, MD  ondansetron (  ZOFRAN) 4 MG tablet Take 1 tablet (4 mg total) by mouth daily as needed. 01/18/20   Lucille Passy, MD  oxyCODONE (OXY IR/ROXICODONE) 5 MG immediate release tablet Take 1 tablet (5 mg total) by mouth 2 (two) times daily as needed for severe pain. Must last 30 days. 04/27/20 05/27/20  Milinda Pointer, MD  oxyCODONE (OXY IR/ROXICODONE) 5 MG immediate release tablet Take 1 tablet (5 mg total) by mouth 2 (two) times daily as needed for severe pain. Must last 30 days. 05/27/20 06/26/20  Milinda Pointer, MD  oxyCODONE-acetaminophen (PERCOCET) 10-325 MG tablet Take 1 tablet by mouth every 6 (six) hours as needed for pain. Patient not taking: Reported on 06/09/2020 04/28/20 04/28/21  Vaughan Basta, Edman Circle, PA-C  pregabalin (LYRICA) 225 MG capsule Take 1 capsule (225 mg total) by mouth 3 (three) times daily. 02/23/20 08/21/20  Milinda Pointer, MD  spironolactone (ALDACTONE) 25 MG tablet Take 1 tablet (25 mg total) by mouth daily. 01/18/20   Lucille Passy, MD    Review of Systems    She denies chest pain, palpitations, dyspnea, pnd, orthopnea, n, v, dizziness, syncope, weight gain, or early satiety. She reports LEE and claudication.   All other systems reviewed and are otherwise negative except as noted above.  Physical Exam    VS:  BP 126/82 (BP Location: Left Arm, Patient Position: Sitting, Cuff Size: Normal)   Pulse 81   Ht 5\' 7"  (1.702 m)   Wt 199 lb 2 oz (90.3 kg)   SpO2 98%   BMI 31.19 kg/m  , BMI Body mass index is 31.19 kg/m. GEN: Well nourished, well developed, in no acute distress. HEENT: normal. Neck: Supple, no JVD, carotid bruits, or masses. Cardiac: RRR, 1/6 systolic murmur. No rubs, or gallops. No clubbing,  cyanosis, mild bilateral edema.  Radials/DP/PT 1+ and equal bilaterally (worse on L side with pedal pulse confirmed via doppler).  Respiratory:  Respirations regular and unlabored, clear to auscultation bilaterally. GI: Soft, nontender, nondistended, BS + x 4. MS: no deformity or atrophy. Skin: warm and dry, no rash. Neuro:  Strength and sensation are intact. Psych: Normal affect.  Accessory Clinical Findings    ECG personally reviewed by me today - NSR, 81bpm, no acute change from previous - no acute changes.  VITALS Reviewed today   Temp Readings from Last 3 Encounters:  06/09/20 (!) 97.1 F (36.2 C) (Tympanic)  05/24/20 98.3 F (36.8 C)  04/28/20 98.2 F (36.8 C) (Oral)   BP Readings from Last 3 Encounters:  06/14/20 126/82  06/09/20 106/64  05/24/20 135/83   Pulse Readings from Last 3 Encounters:  06/14/20 81  06/09/20 71  05/24/20 (!) 106    Wt Readings from Last 3 Encounters:  06/14/20 199 lb 2 oz (90.3 kg)  06/09/20 202 lb (91.6 kg)  05/24/20 196 lb (88.9 kg)     LABS  reviewed today   Lab Results  Component Value Date   WBC 15.6 (H) 04/28/2020   HGB 13.8 04/28/2020   HCT 42.7 04/28/2020   MCV 89.0 04/28/2020   PLT 370 04/28/2020   Lab Results  Component Value Date   CREATININE 0.64 04/28/2020   BUN 9 04/28/2020   NA 135 04/28/2020   K 4.1 04/28/2020   CL 97 (L) 04/28/2020   CO2 27 04/28/2020   Lab Results  Component Value Date   ALT 23 04/22/2020   AST 19 04/22/2020   ALKPHOS 81 04/22/2020   BILITOT 0.5 04/22/2020   Lab  Results  Component Value Date   CHOL 157 09/07/2019   HDL 38.70 (L) 09/07/2019   LDLCALC 80 09/07/2019   LDLDIRECT 116.0 06/29/2019   TRIG 191.0 (H) 09/07/2019   CHOLHDL 4 09/07/2019    Lab Results  Component Value Date   HGBA1C 9.5 (A) 04/11/2020   Lab Results  Component Value Date   TSH 1.693 09/15/2019     STUDIES/PROCEDURES reviewed today   03/21/20 Carotids Summary:  Right Carotid: Velocities in the  right ICA are consistent with a 80-99% stenosis. Non-hemodynamically significant plaque <50% noted  in the CCA. ICA stenosis has increased since prior study. ECA  not  seen, there is a question of occlusion.  Left Carotid: Velocities in the left ICA are consistent with a 40-59%  stenosis.  The ECA appears >50% stenosed. ICA stenosis has increased  since prior study.  Vertebrals: Right vertebral artery demonstrates antegrade flow. Left  vertebral  artery demonstrates no discernable flow. ? occlusion of left  vertebral artery.  Subclavians: Normal flow hemodynamics were seen in bilateral subclavian        arteries.   07/2019 Abdominal aortogram with LE 1.  No significant aortoiliac disease. 2.  Left lower extremity: Patent mid to distal SFA stent.  However, the proximal portion is now diffusely diseased and in addition, the SFA is occluded distal to the stent which is new.  Also the anterior tibial artery is diffusely diseased and occluded distally and the peroneal artery is also diffusely diseased. Recommendations: Unfortunately, the patient seems to have very aggressive disease.  Endovascular revascularization of the left SFA will require working on the whole vessel with a high chance of reocclusion especially in the setting of continued tobacco use.  The best option at the present time is likely medical therapy and a structured walking program through cardiac rehab.  Long-term if revascularization is required, femoral-popliteal bypass likely has better long-term outcome but that should be left as a last resort. I am going to switch the patient from Plavix to Pletal for symptomatic relief. I strongly advised the patient to quit smoking.  08/29/2016 Cath  Ost LM to LM lesion, 30 %stenosed.  Ost Cx to Prox Cx lesion, 60 %stenosed.  Ost LAD lesion, 50 %stenosed.  Ost 1st Diag to 1st Diag lesion, 60 %stenosed.  Prox LAD lesion, 95 %stenosed.  Prox Cx lesion, 90 %stenosed.  Mid  Cx lesion, 60 %stenosed.  Mid RCA lesion, 100 %stenosed.  Seq SVG- om1 graft was visualized by angiography and is normal in caliber.  The graft exhibits minimal luminal irregularities.  Seq SVG- graft was visualized by angiography and is normal in caliber and anatomically normal.  1st Diag lesion, 50 %stenosed.  SVG and is normal in caliber and anatomically normal.  LIMA and is normal in caliber and anatomically normal.  Mid LAD to Dist LAD lesion, 70 %stenosed.  The left ventricular systolic function is normal.  LV end diastolic pressure is mildly elevated.  The left ventricular ejection fraction is 55-65% by visual estimate. 1. Severe underlying three-vessel coronary artery disease with patent grafts including SVG to right PDA, sequential vein graft to diagonal 1/ OM 1 and LIMA to LAD. The LIMA makes an unusual turn distally before the anastomosis with diffuse disease in the LAD distal to the anastomosis. The vessel is overall small in caliber with the dominant vessel being the large diagonal branch. 2. Normal LV systolic function and mildly elevated left ventricular end-diastolic pressure. Recommendations: Continue aggressive medical therapy. I  suspect that her symptoms were likely related to uncontrolled hypertension. I increased the dose of carvedilol to 25 mg twice daily. I agree with the addition of small dose long acting nitroglycerin as well. Monitor overnight and possible discharge home tomorrow.  Intraoperative TEE 07/06/2016 - Left ventricle: Systolic function was mildly reduced. The  estimated ejection fraction was in the range of 45% to 50%.  - Mitral valve: There was mild regurgitation.  - Left atrium: No evidence of thrombus in the atrial cavity or  appendage.  - Right atrium: No evidence of thrombus in the atrial cavity or  appendage.  - Atrial septum: No defect or patent foramen ovale was identified.  - Pulmonic valve: No evidence of vegetation.    Assessment & Plan    PAD with bilateral calf claudication --Reports rapid progression of symptoms since 01/2020.  Known diffuse disease in the SFA with occlusion distally. Today, pulses faint on LLE but obtained with doppler. 07/2019 imaging showed proximal portion of SFA stent diffusely diseased and in addition the SFA occluded distal to the stent, which was new. Also, anterior tibial artery diffusely diseased and occluded distally and peroneal artery diffusely diseased. Known aaggressive disease.  As noted in past, revascularization of left SFA is possible but will require working on the entire vessel with high likelihood of reocclusion. Medical therapy thus recommended. --Continue medical therapy with ASA, Plavix, Repatha.  Continue ambulation/regular walking program.  Continue smoking cessation.  Will update LE vascular studies today given patient's report of rapid progression of symptoms and per patient preference.  As noted in past, if revascularization is needed, best option will be a femoral popliteal bypass and as last resort. Recommend follow-up with Dr. Fletcher Anon to discuss studies.   CAD --No angina. Continue walking program. S/p CABG 06/2016. Continue current medications. No indication for repeat ischemic evaluation at this time. Reassess at RTC.  Carotid dz s/p R carotid endarterectomy --As in HPI. Continue statin and DAPT / cholesterol control. Smoking cessation.   HTN --BP at goal. No medication changes at this time.   HLD --Continue Repatha. Previous LDL not at goal. Recheck lipids at RTC.   Smoking --Continue to work on quitting smoking.   DM2 --Glycemic control recommended.    Arvil Chaco, PA-C

## 2020-06-14 NOTE — Patient Instructions (Signed)
Medication Instructions:  No changes  *If you need a refill on your cardiac medications before your next appointment, please call your pharmacy*   Lab Work: None  If you have labs (blood work) drawn today and your tests are completely normal, you will receive your results only by: Marland Kitchen MyChart Message (if you have MyChart) OR . A paper copy in the mail If you have any lab test that is abnormal or we need to change your treatment, we will call you to review the results.   Testing/Procedures: Your physician has requested that you have an ankle brachial index (ABI). During this test an ultrasound and blood pressure cuff are used to evaluate the arteries that supply the arms and legs with blood. Allow thirty minutes for this exam. There are no restrictions or special instructions.  Your physician has requested that you have a lower or upper extremity arterial duplex. This test is an ultrasound of the arteries in the legs or arms. It looks at arterial blood flow in the legs and arms. Allow one hour for Lower and Upper Arterial scans. There are no restrictions or special instructions    Follow-Up: At [facility], you and your health needs are our priority.  As part of our continuing mission to provide you with exceptional heart care, we have created designated Provider Care Teams.  These Care Teams include your primary Cardiologist (physician) and Advanced Practice Providers (APPs -  Physician Assistants and Nurse Practitioners) who all work together to provide you with the care you need, when you need it.   Your next appointment:   Follow up with Dr. Fletcher Anon after testing.   The format for your next appointment:   In Person  Provider:    You may see [name], MD or one of the following Advanced Practice Providers on your designated Care Team:    Murray Hodgkins, NP  Christell Faith, PA-C  Marrianne Mood, PA-C

## 2020-06-21 ENCOUNTER — Encounter: Payer: Self-pay | Admitting: Pain Medicine

## 2020-06-21 NOTE — Progress Notes (Signed)
Patient: Erika [name]  Service Category: E/M  Provider: Gaspar Cola, MD  DOB: October 29, 1971  DOS: 06/22/2020  Location: Office  MRN: [id number]  Setting: Ambulatory outpatient  Referring Provider: Lucille Passy, MD  Type: Established Patient  Specialty: Interventional Pain Management  PCP: Libby Maw, MD  Location: Remote location  Delivery: TeleHealth     Virtual Encounter - Pain Management PROVIDER NOTE: Information contained herein reflects review and annotations entered in association with encounter. Interpretation of such information and data should be left to medically-trained personnel. Information provided to patient can be located elsewhere in the medical record under "Patient Instructions". Document created using STT-dictation technology, any transcriptional errors that may result from process are unintentional.    Contact & Pharmacy Preferred: [phone] Home: [phone] (home) Mobile: [phone] (mobile) E-mail: [email or web address]  [name] [id number]: [phone]: [phone]  Pre-screening  Erika [name] offered "in-person" vs "virtual" encounter. She indicated preferring virtual for this encounter.   Reason COVID-19*   Social distancing based on CDC and AMA recommendations.   I contacted CZACARI RADICKon 06/22/2020 via telephone.      I clearly identified myself as FGaspar Cola MD. I verified that I was speaking with the correct person using two identifiers (Name: [name] and date of birth: [birth year].  Consent I sought verbal advanced consent from Erika [name] virtual visit interactions. I informed Ms. Tropea of possible security and privacy concerns, risks, and limitations associated with providing "not-in-person" medical evaluation and management services. I also informed Ms. Dettinger of the availability of  "in-person" appointments. Finally, I informed her that there would be a charge for the virtual visit and that she could be  personally, fully or partially, financially responsible for it. Ms. MScaliciexpressed understanding and agreed to proceed.   Historic Elements   Ms. CNANDA BITTICKis a 49y.o. year old, female patient evaluated today after her last contact with our practice on 02/25/2020. Erika [name] has a past medical history of Arthralgia of temporomandibular joint, CAD, multiple vessel, Carotid arterial disease ([place], Clotting disorder ([place], Depression, Diastolic dysfunction, Fatty liver disease, nonalcoholic ([address], History of blood transfusion, HLD (hyperlipidemia), Labile hypertension, Myocardial infarction ([place] (2017), Obesity, PAD (peripheral artery disease) ([place], PTSD (post-traumatic stress disorder), Tobacco abuse, and Type 2 diabetes mellitus ([facility] (January 2017). She also  has a past surgical history that includes Cholecystectomy; Tonsillectomy; Cesarean section; Cardiac catheterization ([name], 04/18/2016); Cardiac catheterization ([name], 06/29/2016); Coronary artery bypass graft ([name], 07/06/2016); TEE without cardioversion ([name], 07/06/2016); Endarterectomy (Right, 07/06/2016); Cardiac catheterization ([name], 08/29/2016); ABDOMINAL AORTOGRAM W/LOWER EXTREMITY ([name], 10/15/2018); PERIPHERAL VASCULAR INTERVENTION (Left, 10/15/2018); ABDOMINAL AORTOGRAM W/LOWER EXTREMITY (Bilateral, 08/19/2019); and Endarterectomy (Right, 04/27/2020). [name] a current medication list which includes the following prescription(s): albuterol, amlodipine, amoxicillin, aspirin ec, benztropine, carvedilol, chlorpromazine, vitamin d3, clonazepam, clonidine, clopidogrel, farxiga, repatha sureclick, fluoxetine hcl, toujeo max solostar, insulin lispro, insulin syringe-needle u-100, isosorbide mononitrate, lamotrigine, losartan, magic mouthwash, naloxone, nicotine, nitroglycerin, ondansetron, [START ON 06/26/2020]  oxycodone, pregabalin, and spironolactone. She  reports that she has been smoking cigarettes. She has a 6.75 pack-year smoking history. She has never used smokeless tobacco. She reports previous alcohol use. She reports that she does not use drugs. Erika [name] allergic to chantix [varenicline tartrate].   HPI  Today, she is being contacted for medication management. The patient indicates doing well with the current medication regimen. No adverse  reactions or side effects reported to the medications.  Today the patient had several questions regarding her medications.  She indicated that after surgery she had been given some oxycodone/APAP 10/325 and of course she noticed that it worked better than her oxycodone IR.  Today she asked me if would feel comfortable changing her medication to oxycodone/APAP 10/325.  I informed the patient that I would not be doing that since we need to keep our eye on the long-term goals and if we were to start increasing the medication dose or switching medications around four no particular reason, we would be getting into the opioid dose increase roller coaster, which I have absolutely no desire to get into.  I explained the reasons to the patient in terms of tolerance and how she would need to do a drug holiday. I took the opportunity to explain the concept of tolerance and how "Drug Holidays" help control it.  I detailed how to do a slow taper to avoid withdrawals.  In addition the patient requested some guidance as to what she should do whenever she has a flareup of her pain.  I reminded her that our specialty is interventional pain management and what she should do is to give [name] a call so that we can evaluate her pain and perhaps treated with some interventional therapy so as to manage the acute episodes.  The patient also indicated that she has developed a foot drop and she is scheduled to see a neurologist for this.  I also reminded the patient that we treat that type of a  problem.  She indicated having a foot drop with pain over the top of the foot and into the big toe suggesting a possible L5 radiculopathy.  I told her that should the neurologist determined that that is the case, to remember that we can do lumbar epidural steroid injections for this.  She understood and accepted.  Pharmacotherapy Assessment  Analgesic: Oxycodone IR 5 mg. 1 tab PO BID (10 mg/day of oxycodone) MME/day: 15 mg/day.   Monitoring: [name] PMP: PDMP reviewed during this encounter.       Pharmacotherapy: No side-effects or adverse reactions reported. Compliance: No problems identified. Effectiveness: Clinically acceptable. Plan: Refer to "POC".  UDS:  Summary  Date Value Ref Range Status  01/07/2019 FINAL  Final    Comment:    ==================================================================== TOXASSURE COMP DRUG ANALYSIS,UR ==================================================================== Test                             Result       Flag       Units Drug Present and Declared for Prescription Verification   7-aminoclonazepam              115          EXPECTED   ng/mg creat    7-aminoclonazepam is an expected metabolite of clonazepam. Source    of clonazepam is a scheduled prescription medication.   Hydrocodone                    732          EXPECTED   ng/mg creat   Hydromorphone                  158          EXPECTED   ng/mg creat   Dihydrocodeine  212          EXPECTED   ng/mg creat   Norhydrocodone                 1339         EXPECTED   ng/mg creat    Sources of hydrocodone include scheduled prescription    medications. Hydromorphone, dihydrocodeine and norhydrocodone are    expected metabolites of hydrocodone. Hydromorphone and    dihydrocodeine are also available as scheduled prescription    medications.   Cyclobenzaprine                PRESENT      EXPECTED   Desmethylcyclobenzaprine       PRESENT      EXPECTED    Desmethylcyclobenzaprine is an  expected metabolite of    cyclobenzaprine.   Chlorpromazine                 PRESENT      EXPECTED   Acetaminophen                  PRESENT      EXPECTED Drug Absent but Declared for Prescription Verification   Lamotrigine                    Not Detected UNEXPECTED   Fluoxetine                     Not Detected UNEXPECTED   Salicylate                     Not Detected UNEXPECTED    Aspirin, as indicated in the declared medication list, is not    always detected even when used as directed.   Benztropine                    Not Detected UNEXPECTED ==================================================================== Test                      Result    Flag   Units      Ref Range   Creatinine              165              mg/dL      >=20 ==================================================================== Declared Medications:  The flagging and interpretation on this report are based on the  following declared medications.  Unexpected results may arise from  inaccuracies in the declared medications.  **Note: The testing scope of this panel includes these medications:  Benztropine (Cogentin)  Chlorpromazine (Thorazine)  Clonazepam (Klonopin)  Cyclobenzaprine (Fexmid)  Fluoxetine  Hydrocodone (Norco)  Lamotrigine (Lamictal)  **Note: The testing scope of this panel does not include small to  moderate amounts of these reported medications:  Acetaminophen (Norco)  Aspirin (Aspirin 81)  **Note: The testing scope of this panel does not include following  reported medications:  Albuterol  Alirocumab  Carvedilol (Coreg)  Clopidogrel (Plavix)  Insulin  Insulin (Lantus)  Isosorbide (Imdur)  Losartan (Cozaar)  Mouthwash (Magic Mouthwash)  Naloxone (Narcan)  Nitroglycerin (Nitrostat)  Ondansetron (Zofran)  Rosuvastatin (Crestor)  Spironolactone (Aldactone)  Triamcinolone (Kenalog) ==================================================================== For clinical consultation, please call  [phone]. ====================================================================     Laboratory Chemistry Profile   Renal Lab Results  Component Value Date   BUN 9 04/28/2020   CREATININE 0.64 04/28/2020   LABCREA 64.9 03/04/2019   BCR 12 08/13/2019   GFR  132.97 09/07/2019   GFRAA >60 04/28/2020   GFRNONAA >60 04/28/2020   LABVMA 1.5 03/04/2019   LABVMA 2.3 03/04/2019     Hepatic Lab Results  Component Value Date   AST 19 04/22/2020   ALT 23 04/22/2020   ALBUMIN 4.1 04/22/2020   ALKPHOS 81 04/22/2020   HCVAB <0.1 09/15/2019   LIPASE 34 05/28/2009     Electrolytes Lab Results  Component Value Date   NA 135 04/28/2020   K 4.1 04/28/2020   CL 97 (L) 04/28/2020   CALCIUM 9.9 04/28/2020   MG 2.0 01/07/2019   PHOS 3.1 05/19/2017     Bone Lab Results  Component Value Date   VD25OH 14.27 (L) 01/18/2020   25OHVITD1 16 (L) 01/07/2019   25OHVITD2 <1.0 01/07/2019   25OHVITD3 15 01/07/2019     Inflammation (CRP: Acute Phase) (ESR: Chronic Phase) Lab Results  Component Value Date   CRP 15 (H) 01/07/2019   ESRSEDRATE 17 03/17/2019   LATICACIDVEN 2.3 ([place]) 05/19/2017       Note: Above Lab results reviewed.  Imaging  CT ANGIO NECK W OR WO CONTRAST CLINICAL DATA:  Carotid artery stenosis  EXAM: CT ANGIOGRAPHY NECK  TECHNIQUE: Multidetector CT imaging of the neck was performed using the standard protocol during bolus administration of intravenous contrast. Multiplanar CT image reconstructions and MIPs were obtained to evaluate the vascular anatomy. Carotid stenosis measurements (when applicable) are obtained utilizing NASCET criteria, using the distal internal carotid diameter as the denominator.  CONTRAST:  52m OMNIPAQUE IOHEXOL 300 MG/ML  SOLN  COMPARISON:  06/30/2016  FINDINGS: Skeleton: There is no bony spinal canal stenosis. No lytic or blastic lesion.  Other neck: Normal pharynx, larynx and major salivary glands. No cervical lymphadenopathy.  Unremarkable thyroid gland.  Upper chest: No pneumothorax or pleural effusion. No nodules or masses.  Aortic arch: There is mild calcific atherosclerosis of the aortic arch. There is no aneurysm, dissection or hemodynamically significant stenosis of the visualized ascending aorta and aortic arch. Normal variant aortic arch branching pattern with the left vertebral artery arising independently from the aortic arch. The visualized proximal subclavian arteries are widely patent.  Right carotid system:  --Common carotid artery: Large amount noncalcified atheromatous within the right common carotid artery, improved compared to 06/30/2016, with interval endarterectomy since that time.  --Internal carotid artery: Large amount of atheromatous plaque at the bifurcation causes occlusion or critical stenosis of the proximal internal and external carotid arteries unchanged. The distal ICA is normal.  --External carotid artery: Origin is occluded but normal distally.  Left carotid system:  --Common carotid artery: Widely patent origin without common carotid artery dissection or aneurysm.  --Internal carotid artery:No dissection, occlusion or aneurysm. Mixed calcified and non-calcified atherosclerotic disease at the bifurcation, extending into the internal carotid artery, resulting in 50% stenosis.  --External carotid artery: No acute abnormality.  Vertebral arteries: Right dominant configuration. Right vertebral artery origin is normal. Left vertebral artery is severely narrowed at its origin, unchanged. The right vertebral artery is normal to the skull base. There is severe stenosis of the proximal V4 segment, unchanged. Unchanged moderate multifocal left V4 segment stenosis.  Review of the MIP images confirms the above findings  IMPRESSION: 1. Status post right carotid endarterectomy with improved patency of the right common carotid artery. 2. Unchanged occlusion or critical  stenosis of the proximal right internal and external carotid arteries. 3. Unchanged 50% stenosis of the proximal left internal carotid artery. 4. Unchanged severe stenosis at the origin of  the left vertebral artery and severe stenosis of the proximal right V4 segment. 5. Aortic Atherosclerosis (ICD10-I70.0).  Electronically Signed   By: Ulyses Jarred M.D.   On: 04/08/2020 23:22  Assessment  The primary encounter diagnosis was Chronic pain syndrome. Diagnoses of Chronic headaches (1ry area of Pain) (Right), Chronic jaw pain (Right), Geniculate Neuralgia (Right), Foot drop, left, Chronic hip pain (Left), Gluteal tendonitis of buttock (Left), Tendinopathy of gluteus medius (Left), Osteoarthritis of hip (Left), and Pharmacologic therapy were also pertinent to this visit.  Plan of Care  Problem-specific:  No problem-specific Assessment & Plan notes found for this encounter.  Ms. YAEL COPPESS has a current medication list which includes the following long-term medication(s): albuterol, amlodipine, benztropine, carvedilol, chlorpromazine, vitamin d3, clonazepam, clonidine, fluoxetine hcl, toujeo max solostar, insulin lispro, isosorbide mononitrate, lamotrigine, losartan, nitroglycerin, [START ON 06/26/2020] oxycodone, pregabalin, and spironolactone.  Pharmacotherapy (Medications Ordered): Meds ordered this encounter  Medications   oxyCODONE (OXY IR/ROXICODONE) 5 MG immediate release tablet    Sig: Take 1 tablet (5 mg total) by mouth 2 (two) times daily as needed for severe pain. Must last 30 days.    Dispense:  60 tablet    Refill:  0    Chronic Pain: STOP Act (Not applicable) Fill 1 day early if closed on refill date. Do not fill until: 06/26/2020. To last until: 07/26/2020. Avoid benzodiazepines within 8 hours of opioids   Orders:  Orders Placed This Encounter  Procedures   ToxASSURE Select 13 (MW), Urine    Volume: 30 ml(s). Minimum 3 ml of urine is needed. Document temperature of  fresh sample. Indications: Long term (current) use of opiate analgesic (T01.779)    Order Specific Question:   Release to patient    Answer:   Immediate   Follow-up plan:   Return in about 1 month (around 07/25/2020) for (F2F), (MM) to follow-up with UDS.      Interventional management options: Considering:   NOTE: PLAVIX ANTICOAGULATION(Stop:7-10 days pre-procedure; Restart:2 hours post-proc.) Diagnostic right facial nerve block Diagnostic right geniculate nerve block  Diagnostic right occipital nerve block  Possible right occipital nerveRFA Diagnostic Botox injections   Palliative PRN treatment(s):   Diagnostic left peri-insertional gluteal tendon injection #2  Diagnostic Facial (CN VII) nerve block  #4  Diagnostic right facial/Geniculatenerve block #3    Recent Visits No visits were found meeting these conditions. Showing recent visits within past 90 days and meeting all other requirements Today's Visits Date Type Provider Dept  06/22/20 Telemedicine Milinda Pointer, MD [facility]  Showing today's visits and meeting all other requirements Future Appointments No visits were found meeting these conditions. Showing future appointments within next 90 days and meeting all other requirements  I discussed the assessment and treatment plan with the patient. The patient was provided an opportunity to ask questions and all were answered. The patient agreed with the plan and demonstrated an understanding of the instructions.  Patient advised to call back or seek an in-person evaluation if the symptoms or condition worsens.  Duration of encounter: 25 minutes.  Note by: Gaspar Cola, MD Date: 06/22/2020; Time: 8:25 AM

## 2020-06-22 ENCOUNTER — Other Ambulatory Visit: Payer: Self-pay

## 2020-06-22 ENCOUNTER — Ambulatory Visit: Payer: Managed Care, Other (non HMO) | Attending: Pain Medicine | Admitting: Pain Medicine

## 2020-06-22 DIAGNOSIS — B0221 Postherpetic geniculate ganglionitis: Secondary | ICD-10-CM | POA: Diagnosis present

## 2020-06-22 DIAGNOSIS — M21372 Foot drop, left foot: Secondary | ICD-10-CM | POA: Insufficient documentation

## 2020-06-22 DIAGNOSIS — M25552 Pain in left hip: Secondary | ICD-10-CM | POA: Diagnosis present

## 2020-06-22 DIAGNOSIS — M1612 Unilateral primary osteoarthritis, left hip: Secondary | ICD-10-CM | POA: Diagnosis present

## 2020-06-22 DIAGNOSIS — M7602 Gluteal tendinitis, left hip: Secondary | ICD-10-CM | POA: Insufficient documentation

## 2020-06-22 DIAGNOSIS — G8929 Other chronic pain: Secondary | ICD-10-CM | POA: Diagnosis present

## 2020-06-22 DIAGNOSIS — G894 Chronic pain syndrome: Secondary | ICD-10-CM

## 2020-06-22 DIAGNOSIS — R6884 Jaw pain: Secondary | ICD-10-CM | POA: Insufficient documentation

## 2020-06-22 DIAGNOSIS — R519 Headache, unspecified: Secondary | ICD-10-CM | POA: Diagnosis present

## 2020-06-22 DIAGNOSIS — Z79899 Other long term (current) drug therapy: Secondary | ICD-10-CM

## 2020-06-22 DIAGNOSIS — M67952 Unspecified disorder of synovium and tendon, left thigh: Secondary | ICD-10-CM | POA: Diagnosis present

## 2020-06-22 MED ORDER — OXYCODONE HCL 5 MG PO TABS: 5.0000 mg | ORAL_TABLET | Freq: Two times a day (BID) | ORAL | 0 refills | Status: DC | PRN

## 2020-06-24 ENCOUNTER — Other Ambulatory Visit (HOSPITAL_COMMUNITY): Payer: Self-pay | Admitting: Psychiatry

## 2020-06-24 DIAGNOSIS — F4310 Post-traumatic stress disorder, unspecified: Secondary | ICD-10-CM

## 2020-06-30 LAB — TOXASSURE SELECT 13 (MW), URINE

## 2020-07-14 ENCOUNTER — Other Ambulatory Visit: Payer: Self-pay

## 2020-07-14 DIAGNOSIS — E559 Vitamin D deficiency, unspecified: Secondary | ICD-10-CM

## 2020-07-14 MED ORDER — MAGIC MOUTHWASH: 15.0000 mL | Freq: Three times a day (TID) | ORAL | 1 refills | Status: DC | PRN

## 2020-07-14 NOTE — Telephone Encounter (Signed)
Okay to dispense magic mouth wash:  250 ml with equal parts lido/antacid/diphenhydramine

## 2020-07-20 ENCOUNTER — Other Ambulatory Visit (HOSPITAL_COMMUNITY): Payer: Self-pay | Admitting: *Deleted

## 2020-07-20 DIAGNOSIS — F4310 Post-traumatic stress disorder, unspecified: Secondary | ICD-10-CM

## 2020-07-20 MED ORDER — CHLORPROMAZINE HCL 100 MG PO TABS: 100.0000 mg | ORAL_TABLET | Freq: Every day | ORAL | 0 refills | Status: DC

## 2020-07-21 ENCOUNTER — Other Ambulatory Visit: Payer: Self-pay

## 2020-07-21 ENCOUNTER — Ambulatory Visit (INDEPENDENT_AMBULATORY_CARE_PROVIDER_SITE_OTHER): Payer: Managed Care, Other (non HMO)

## 2020-07-21 DIAGNOSIS — I739 Peripheral vascular disease, unspecified: Secondary | ICD-10-CM | POA: Diagnosis not present

## 2020-07-24 NOTE — Progress Notes (Signed)
PROVIDER NOTE: Information contained herein reflects review and annotations entered in association with encounter. Interpretation of such information and data should be left to medically-trained personnel. Information provided to patient can be located elsewhere in the medical record under "Patient Instructions". Document created using STT-dictation technology, any transcriptional errors that may result from process are unintentional.    Patient: Erika [name]  Service Category: E/M  Provider: Gaspar Cola, MD  DOB: Mar 29, 1971  DOS: 07/25/2020  Specialty: Interventional Pain Management  MRN: [id number]  Setting: Ambulatory outpatient  PCP: Libby Maw, MD  Type: Established Patient    Referring Provider: Libby Maw,*  Location: Office  Delivery: Face-to-face     HPI  Reason for encounter: Erika [name], a 49 y.o. year old female, is here today for evaluation and management of her Chronic pain syndrome [G89.4]. Ms. Driver primary complain today is Headache (right ear to right eye) Last encounter: Practice (02/25/2020). My last encounter with her was on 02/25/2020. Pertinent problems: Ms. Kasprzak has Cluster headache; Chest pain with high risk for cardiac etiology; Other chronic pain; Bilateral hip pain; Chronic ankle pain (Bilateral); Tendinopathy of right gluteus medius; Tendinopathy of gluteus medius (Left); PVD (peripheral vascular disease) ([place]); Chronic pain syndrome; Chronic headaches (1ry area of Pain) (Right); Neurogenic pain; Atypical facial pain (Right); Chronic ear pain (Right); Chronic jaw pain (Right); Geniculate Neuralgia (Right); History of carotid endarterectomy (Right); Right hip pain; Neuropathy of right radial nerve; Radial nerve palsy (Right); Chronic hip pain (Left); Osteoarthritis of hip (Left); Gluteal tendonitis of buttock (Left); Chronic migraine; Neuropathy; and Foot drop, left on their pertinent problem list. Pain Assessment:  Severity of Chronic pain is reported as a 4 /10. Location: Head Right/right ear to right eye. Onset: More than a month ago. Quality: Stabbing, Aching. Timing: Constant. Modifying factor(s): pain medication. Vitals:  height is $RemoveB'5\' 7"'eAckoOvI$  (1.702 m) and weight is 194 lb (88 kg). Her temporal temperature is 98.4 F (36.9 C). Her blood pressure is 162/90 (abnormal) and her pulse is 97. Her oxygen saturation is 97%.    The patient indicates doing well with the current medication regimen. No adverse reactions or side effects reported to the medications.  The patient indicates that lately she has been experiencing a lot more pain in the hip and physical exam today was positive for left hip arthralgia and decreased range of motion.  She continues to have a lot of vascular problems.  She indicates that she was recently told that she has 100% blockade of her lower extremity arteries.  This means that it would be rather dangerous for her to come off of the blood thinners, but she still wants to see if it can be done.  We will be sending a clearance to see if she can stop them for the procedure.  She refers that she has done that in the past without any problems.  The patient indicates recently having had a right carotid endarterectomy to remove some of the blockage from her carotid artery.  Pharmacotherapy Assessment   Analgesic: Oxycodone IR 5 mg. 1 tab PO BID (10 mg/day of oxycodone) MME/day: 15 mg/day.   Monitoring:  PMP: PDMP reviewed during this encounter.       Pharmacotherapy: No side-effects or adverse reactions reported. Compliance: No problems identified. Effectiveness: Clinically acceptable.  Chauncey Fischer, RN  07/25/2020  8:51 AM  Sign when Signing Visit Nursing Pain Medication Assessment:  Safety precautions to be maintained throughout the outpatient stay will include:  orient to surroundings, keep bed in low position, maintain call bell within reach at all times, provide assistance with transfer out  of bed and ambulation.  Medication Inspection Compliance: Pill count conducted under aseptic conditions, in front of the patient. Neither the pills nor the bottle was removed from the patient's sight at any time. Once count was completed pills were immediately returned to the patient in their original bottle.  Medication: Oxycodone IR Pill/Patch Count: 21 of 60 pills remain Pill/Patch Appearance: Markings consistent with prescribed medication Bottle Appearance: Standard pharmacy container. Clearly labeled. Filled Date: 7 / 36 / 21 Last Medication intake:  YesterdaySafety precautions to be maintained throughout the outpatient stay will include: orient to surroundings, keep bed in low position, maintain call bell within reach at all times, provide assistance with transfer out of bed and ambulation.     UDS:  Summary  Date Value Ref Range Status  06/23/2020 Note  Corrected    Comment:    ==================================================================== [employer] [facility] 13 (MW) ==================================================================== Test                             Result       Flag       Units  Drug Present and Declared for Prescription Verification   Clonazepam                     28           EXPECTED   ng/mg creat   7-aminoclonazepam              303          EXPECTED   ng/mg creat    Source of clonazepam is a scheduled prescription medication. 7-    aminoclonazepam is an expected metabolite of clonazepam.  Drug Absent but Declared for Prescription Verification   Oxycodone                      Not Detected UNEXPECTED ng/mg creat ==================================================================== Test                      Result    Flag   Units      Ref Range   Creatinine              105              mg/dL      >=20 ==================================================================== Declared Medications:  The flagging and interpretation on this report are based on  the  following declared medications.  Unexpected results may arise from  inaccuracies in the declared medications.   **Note: The testing scope of this panel includes these medications:   Clonazepam (Klonopin)  Oxycodone (Roxicodone)   **Note: The testing scope of this panel does not include the  following reported medications:   Albuterol (Ventolin HFA)  Amlodipine (Norvasc)  Amoxicillin (Amoxil)  Aspirin  Benztropine (Cogentin)  Carvedilol (Coreg)  Chlorpromazine  Clonidine (Catapres)  Clopidogrel (Plavix)  Dapagliflozin (Farxiga)  Evolocumab  Fluoxetine  Insulin (Toujeo)  Isosorbide (Imdur)  Lamotrigine (Lamictal)  Losartan (Cozaar)  Mouthwash  Naloxone (Narcan)  Nicotine  Nitroglycerin (Nitrostat)  Ondansetron (Zofran)  Pregabalin (Lyrica)  Spironolactone (Aldactone)  Vitamin D3 ==================================================================== For clinical consultation, please call [phone]. ====================================================================      ROS  Constitutional: Denies any fever or chills Gastrointestinal: No reported hemesis, hematochezia, vomiting, or acute GI distress Musculoskeletal: Denies  any acute onset joint swelling, redness, loss of ROM, or weakness Neurological: No reported episodes of acute onset apraxia, aphasia, dysarthria, agnosia, amnesia, paralysis, loss of coordination, or loss of consciousness  Medication Review  Evolocumab, FLUoxetine HCl, Insulin Syringe-Needle U-100, albuterol, amLODipine, aspirin EC, benztropine, carvedilol, chlorproMAZINE, clonazePAM, clopidogrel, dapagliflozin propanediol, insulin glargine (2 Unit Dial), insulin lispro, isosorbide mononitrate, lamoTRIgine, losartan, magic mouthwash, naloxone, nitroGLYCERIN, ondansetron, oxyCODONE, pregabalin, and spironolactone  History Review  Allergy: Ms. Katich is allergic to chantix [varenicline tartrate]. Drug: Ms. Dapper  reports no history of  drug use. Alcohol:  reports previous alcohol use. Tobacco:  reports that she has been smoking cigarettes. She has a 6.75 pack-year smoking history. She has never used smokeless tobacco. Social: Ms. Blahnik  reports that she has been smoking cigarettes. She has a 6.75 pack-year smoking history. She has never used smokeless tobacco. She reports previous alcohol use. She reports that she does not use drugs. Medical:  has a past medical history of Arthralgia of temporomandibular joint, CAD, multiple vessel, Carotid arterial disease ([place]), Clotting disorder ([place]), Depression, Diastolic dysfunction, Fatty liver disease, nonalcoholic (1937), History of blood transfusion, HLD (hyperlipidemia), Labile hypertension, Myocardial infarction ([place]) (2017), Obesity, PAD (peripheral artery disease) ([place]), PTSD (post-traumatic stress disorder), Tobacco abuse, and Type 2 diabetes mellitus [facility]) (January 2017). Surgical: Ms. Trovato  has a past surgical history that includes Cholecystectomy; Tonsillectomy; Cesarean section; Cardiac catheterization ([name], 04/18/2016); Cardiac catheterization ([name], 06/29/2016); Coronary artery bypass graft ([name], 07/06/2016); TEE without cardioversion ([name], 07/06/2016); Endarterectomy (Right, 07/06/2016); Cardiac catheterization ([name], 08/29/2016); ABDOMINAL AORTOGRAM W/LOWER EXTREMITY ([name], 10/15/2018); PERIPHERAL VASCULAR INTERVENTION (Left, 10/15/2018); ABDOMINAL AORTOGRAM W/LOWER EXTREMITY (Bilateral, 08/19/2019); and Endarterectomy (Right, 04/27/2020). Family: family history includes Alcohol abuse in her father; Anxiety disorder in her sister; Diabetes in her father and mother; Drug abuse in her father; Heart disease in her father; Stroke in her sister. She was adopted.  Laboratory Chemistry Profile   Renal Lab Results  Component Value Date   BUN 9 04/28/2020   CREATININE 0.64 04/28/2020   LABCREA 64.9 03/04/2019   BCR 12 08/13/2019   GFR 132.97 09/07/2019   GFRAA >60 04/28/2020   GFRNONAA  >60 04/28/2020   LABVMA 1.5 03/04/2019   LABVMA 2.3 03/04/2019     Hepatic Lab Results  Component Value Date   AST 19 04/22/2020   ALT 23 04/22/2020   ALBUMIN 4.1 04/22/2020   ALKPHOS 81 04/22/2020   HCVAB <0.1 09/15/2019   LIPASE 34 05/28/2009     Electrolytes Lab Results  Component Value Date   NA 135 04/28/2020   K 4.1 04/28/2020   CL 97 (L) 04/28/2020   CALCIUM 9.9 04/28/2020   MG 2.0 01/07/2019   PHOS 3.1 05/19/2017     Bone Lab Results  Component Value Date   VD25OH 14.27 (L) 01/18/2020   25OHVITD1 16 (L) 01/07/2019   25OHVITD2 <1.0 01/07/2019   25OHVITD3 15 01/07/2019     Inflammation (CRP: Acute Phase) (ESR: Chronic Phase) Lab Results  Component Value Date   CRP 15 (H) 01/07/2019   ESRSEDRATE 17 03/17/2019   LATICACIDVEN 2.3 ([place]) 05/19/2017       Note: Above Lab results reviewed.  Recent Imaging Review  VAS [name] LOWER EXTREMITY ARTERIAL DUPLEX LOWER EXTREMITY ARTERIAL DUPLEX STUDY  Indications: Claudication, peripheral artery disease, and Patient states both              legs have been progressively getting worse since last exam 03/2019.  She is unable to walk across a parking lot without having to stop              and rest. She does walk with a slight drop foot on the left side.  High Risk Factors: Hypertension, hyperlipidemia, Diabetes, current smoker, prior                    MI, coronary artery disease.  Other Factors: SEE ABI REPORT                  Patient also has RAS.  Vascular Interventions: Right mid-distal SFA stent placed with PTA on                         10/15/2018.                           Angiogram on 08/19/2019 showed patent left mid to distal                         SFA stent with occlusion just distal to stent. The                         peroneal and ATA are diffusely diseased with ATA distal                         occlusion. Current ABI:            Right .51 Left .82  Comparison Study: Prior bilateral leg  arterial duplex exam showed highest                   velocities in right mid SFA 243 cm/s and left CFA 201 cm/s,                   proximal left SFA 277 cm/s and 392 cm/s inn proximal popiteal.                   The highest velocity in the left mid SFA stent was 58 cm/s.  Performing Technologist: Salvadore Dom RVT, RDCS (AE), RDMS    Examination Guidelines: A complete evaluation includes B-mode imaging, spectral Doppler, color Doppler, and power Doppler as needed of all accessible portions of each vessel. Bilateral testing is considered an integral part of a complete examination. Limited examinations for reoccurring indications may be performed as noted.    +-----------+--------+-----+--------+----------------+-------------------------+ RIGHT      PSV cm/sRatioStenosisWaveform        Comments                  +-----------+--------+-----+--------+----------------+-------------------------+ CFA Prox   117                  barely triphasic                          +-----------+--------+-----+--------+----------------+-------------------------+ CFA Distal 26                   biphasic        blunted flow              +-----------+--------+-----+--------+----------------+-------------------------+ DFA        206                  biphasic                                  +-----------+--------+-----+--------+----------------+-------------------------+  SFA Prox                occluded                                          +-----------+--------+-----+--------+----------------+-------------------------+ SFA Mid    140                  monophasic      recannulized by small                                                     collateral                +-----------+--------+-----+--------+----------------+-------------------------+ SFA Distal 50                   monophasic                                 +-----------+--------+-----+--------+----------------+-------------------------+ POP Prox   54                   monophasic                                +-----------+--------+-----+--------+----------------+-------------------------+ POP Distal 47                   monophasic                                +-----------+--------+-----+--------+----------------+-------------------------+ TP Trunk   80                   monophasic                                +-----------+--------+-----+--------+----------------+-------------------------+ ATA Prox   41                   monophasic                                +-----------+--------+-----+--------+----------------+-------------------------+ ATA Distal 12                   monophasic                                +-----------+--------+-----+--------+----------------+-------------------------+ PTA Prox   44                   monophasic                                +-----------+--------+-----+--------+----------------+-------------------------+ PTA Distal 43                   monophasic                                +-----------+--------+-----+--------+----------------+-------------------------+  PERO Prox  22                   monophasic                                +-----------+--------+-----+--------+----------------+-------------------------+ PERO Distal21                   monophasic                                +-----------+--------+-----+--------+----------------+-------------------------+    +-----------+--------+-----+--------+----------+--------------------------+ LEFT       PSV cm/sRatioStenosisWaveform  Comments                   +-----------+--------+-----+--------+----------+--------------------------+ CFA Prox   164                  triphasic                             +-----------+--------+-----+--------+----------+--------------------------+ CFA Distal 93                   biphasic                             +-----------+--------+-----+--------+----------+--------------------------+ DFA        113                  triphasic                            +-----------+--------+-----+--------+----------+--------------------------+ SFA Prox   138                  biphasic  blunted flow               +-----------+--------+-----+--------+----------+--------------------------+ SFA Mid                 occluded          proximal/mid               +-----------+--------+-----+--------+----------+--------------------------+ SFA Distal 55                   monophasicrecannulized by collateral +-----------+--------+-----+--------+----------+--------------------------+ POP Prox   45                   monophasic                           +-----------+--------+-----+--------+----------+--------------------------+ POP Distal 25                   monophasic                           +-----------+--------+-----+--------+----------+--------------------------+ TP Trunk   31                   monophasic                           +-----------+--------+-----+--------+----------+--------------------------+ ATA Prox   21                   monophasic                           +-----------+--------+-----+--------+----------+--------------------------+  ATA Distal 21                   monophasic                           +-----------+--------+-----+--------+----------+--------------------------+ PTA Prox   24                   monophasic                           +-----------+--------+-----+--------+----------+--------------------------+ PTA Distal 23                   monophasic                           +-----------+--------+-----+--------+----------+--------------------------+ PERO Prox  15                    monophasic                           +-----------+--------+-----+--------+----------+--------------------------+ PERO Distal14                   monophasic                           +-----------+--------+-----+--------+----------+--------------------------+    Left Stent(s): +---------------+--------+--------+--------+--------+ MID SFA        PSV cm/sStenosisWaveformComments +---------------+--------+--------+--------+--------+ Prox to Stent          occluded                 +---------------+--------+--------+--------+--------+ Proximal Stent         occluded                 +---------------+--------+--------+--------+--------+ Mid Stent              occluded                 +---------------+--------+--------+--------+--------+ Distal Stent           occluded                 +---------------+--------+--------+--------+--------+ Distal to Stent        occluded                 +---------------+--------+--------+--------+--------+  occluded stent.    Findings reported to Dr. Fletcher Anon at 2:45 pm .   Summary: Right: Total occlusion noted in the superficial femoral artery. Severe progression is noted compared to previous study.  Left: Total occlusion noted in the proximal/mid superficial femoral artery. Occlusion is also noted within the MID SFA stent. Severe progression is noted compared to previous study.    See table(s) above for measurements and observations.   Patient to see Dr. Fletcher Anon on 07/28/2020 at 1:40pm.   Vascular consult recommended.  Electronically signed by [name] MD on 07/22/2020 at 12:34:29 PM.       Final   VAS [name] ABI WITH/WO TBI LOWER EXTREMITY DOPPLER STUDY  Indications: Claudication, peripheral artery disease, and Patient states both              legs have been progressively getting worse since last exam 03/2019.              She is unable to walk across a parking lot without having to stop  and rest. She does walk with a slight drop foot on the left side.  High Risk Factors: Hypertension, hyperlipidemia, Diabetes, current smoker, prior                    MI, coronary artery disease.  Other Factors: SEE BILATERAL LEG ARTERIAL DUPLEX REPORT                  Patient also has RAS.  Vascular Interventions: Right mid-distal SFA stent placed with PTA on                         10/15/2018.                           Angiogram on 08/19/2019 showed patent left mid to distal                         SFA stent with occlusion just distal to stent. The                         peroneal and ATA are diffusely diseased with ATA distal                         occlusion.  Comparison Study: Prior ABI 04/15/2019 right .91 left .54  Performing Technologist: Salvadore Dom RVT, RDCS    Examination Guidelines: A complete evaluation includes at minimum, Doppler waveform signals and systolic blood pressure reading at the level of bilateral brachial, anterior tibial, and posterior tibial arteries, when vessel segments are accessible. Bilateral testing is considered an integral part of a complete examination. Photoelectric Plethysmograph (PPG) waveforms and toe systolic pressure readings are included as required and additional duplex testing as needed. Limited examinations for reoccurring indications may be performed as noted.    ABI Findings: +---------+------------------+-----+----------+--------+ Right    Rt Pressure (mmHg)IndexWaveform  Comment  +---------+------------------+-----+----------+--------+ Brachial 135                                       +---------+------------------+-----+----------+--------+ ATA      67                0.43 monophasic         +---------+------------------+-----+----------+--------+ PTA      80                0.51 monophasic         +---------+------------------+-----+----------+--------+ PERO     71                0.46  monophasic         +---------+------------------+-----+----------+--------+ Great Toe70                0.45 Abnormal           +---------+------------------+-----+----------+--------+  +---------+------------------+-----+----------+-------+ Left     Lt Pressure (mmHg)IndexWaveform  Comment +---------+------------------+-----+----------+-------+ Brachial 156                                      +---------+------------------+-----+----------+-------+ ATA      69                0.44 monophasic        +---------+------------------+-----+----------+-------+  PTA      77                0.49 monophasic        +---------+------------------+-----+----------+-------+ PERO     60                0.38 monophasic        +---------+------------------+-----+----------+-------+ Great Toe61                0.39 Abnormal          +---------+------------------+-----+----------+-------+  +-------+-----------+-----------+------------+------------+ ABI/TBIToday's ABIToday's TBIPrevious ABIPrevious TBI +-------+-----------+-----------+------------+------------+ Right  .51        .45        .91         1.41         +-------+-----------+-----------+------------+------------+ Left   .49        .39        .69         .49          +-------+-----------+-----------+------------+------------+    Bilateral ABIs and TBIs appear decreased compared to prior study on 04/15/2019.   Summary: Right: Resting right ankle-brachial index indicates moderate right lower extremity arterial disease. The right toe-brachial index is abnormal.  Left: Resting left ankle-brachial index indicates severe left lower extremity arterial disease. The left toe-brachial index is abnormal.    *See table(s) above for measurements and observations.    Electronically signed by [name] MD on 07/22/2020 at 12:28:56 PM.       Final   Note: Reviewed        Physical Exam   General appearance: Well nourished, well developed, and well hydrated. In no apparent acute distress Mental status: Alert, oriented x 3 (person, place, & time)       Respiratory: No evidence of acute respiratory distress Eyes: PERLA Vitals: BP (!) 162/90 (BP Location: Left Arm, Patient Position: Sitting, Cuff Size: Normal)   Pulse 97   Temp 98.4 F (36.9 C) (Temporal)   Ht '5\' 7"'$  (1.702 m)   Wt 194 lb (88 kg)   SpO2 97%   BMI 30.38 kg/m  BMI: Estimated body mass index is 30.38 kg/m as calculated from the following:   Height as of this encounter: '5\' 7"'$  (1.702 m).   Weight as of this encounter: 194 lb (88 kg). Ideal: Ideal body weight: 61.6 kg (135 lb 12.9 oz) Adjusted ideal body weight: 72.2 kg (159 lb 1.3 oz)  Assessment   Status Diagnosis  Controlled Controlled Controlled 1. Chronic pain syndrome   2. Chronic headaches (1ry area of Pain) (Right)   3. Geniculate Neuralgia (Right)   4. PVD (peripheral vascular disease) ([place])   5. Neurogenic pain   6. Chronic hip pain (Left)   7. Chronic anticoagulation (PLAVIX)      Updated Problems: No problems updated.  Plan of Care  Problem-specific:  No problem-specific Assessment & Plan notes found for this encounter.  Ms. KRYSTELLE PRASHAD has a current medication list which includes the following long-term medication(s): benztropine, carvedilol, chlorpromazine, clonazepam, fluoxetine hcl, toujeo max solostar, insulin lispro, isosorbide mononitrate, lamotrigine, losartan, nitroglycerin, [START ON 08/07/2020] oxycodone, [START ON 09/06/2020] oxycodone, [START ON 10/06/2020] oxycodone, [START ON 08/07/2020] pregabalin, spironolactone, albuterol, and amlodipine.  Pharmacotherapy (Medications Ordered): Meds ordered this encounter  Medications  . oxyCODONE (OXY IR/ROXICODONE) 5 MG immediate release tablet    Sig: Take 1 tablet (5 mg total) by mouth 2 (two) times daily as needed for severe pain. Must last 30  days.    Dispense:  60  tablet    Refill:  0    Chronic Pain: STOP Act (Not applicable) Fill 1 day early if closed on refill date. Do not fill until: 08/07/2020. To last until: 09/06/2020. Avoid benzodiazepines within 8 hours of opioids  . oxyCODONE (OXY IR/ROXICODONE) 5 MG immediate release tablet    Sig: Take 1 tablet (5 mg total) by mouth 2 (two) times daily as needed for severe pain. Must last 30 days.    Dispense:  60 tablet    Refill:  0    Chronic Pain: STOP Act (Not applicable) Fill 1 day early if closed on refill date. Do not fill until: 09/06/2020. To last until: 10/06/2020. Avoid benzodiazepines within 8 hours of opioids  . oxyCODONE (OXY IR/ROXICODONE) 5 MG immediate release tablet    Sig: Take 1 tablet (5 mg total) by mouth 2 (two) times daily as needed for severe pain. Must last 30 days.    Dispense:  60 tablet    Refill:  0    Chronic Pain: STOP Act (Not applicable) Fill 1 day early if closed on refill date. Do not fill until: 10/06/2020. To last until: 11/05/2020. Avoid benzodiazepines within 8 hours of opioids  . pregabalin (LYRICA) 225 MG capsule    Sig: Take 1 capsule (225 mg total) by mouth 3 (three) times daily.    Dispense:  90 capsule    Refill:  2    Fill one day early if pharmacy is closed on scheduled refill date. May substitute for generic if available.   Orders:  Orders Placed This Encounter  Procedures  . HIP INJECTION    Standing Status:   Future    Standing Expiration Date:   10/25/2020    Scheduling Instructions:     Side: Left-sided     Sedation: With Sedation.     Timeframe: Blood thinner protocol  . Blood Thinner Instructions to Nursing    If the patient requires a Lovenox-bridge therapy, make sure arrangements are made to institute it with the assistance of the PCP.    Standing Status:   Standing    Number of Occurrences:   36    Standing Expiration Date:   09/24/2020    Scheduling Instructions:     Always stop the Plavix (Clopidogrel) x 7-10 days prior to procedure or  surgery.   Follow-up plan:   Return for Procedure (w/ sedation): (L) Hip inj., (Blood Thinner Protocol).      Interventional management options: Considering:   NOTE: PLAVIX ANTICOAGULATION(Stop:7-10 days pre-procedure; Restart:2 hours post-proc.) Diagnostic right facial nerve block Diagnostic right geniculate nerve block  Diagnostic right occipital nerve block  Possible right occipital nerveRFA Diagnostic Botox injections   Palliative PRN treatment(s):   Diagnostic left peri-insertional gluteal tendon injection #2  Diagnostic Facial (CN VII) nerve block  #4  Diagnostic right facial/Geniculatenerve block #3     Recent Visits Date Type Provider Dept  07/25/20 Office Visit Milinda Pointer, [facility]  06/22/20 Telemedicine Milinda Pointer, MD [facility]  Showing recent visits within past 90 days and meeting all other requirements Future Appointments No visits were found meeting these conditions. Showing future appointments within next 90 days and meeting all other requirements  I discussed the assessment and treatment plan with the patient. The patient was provided an opportunity to ask questions and all were answered. The patient agreed with the plan and demonstrated an understanding of the instructions.  Patient advised to  call back or seek an in-person evaluation if the symptoms or condition worsens.  Duration of encounter: 30 minutes.  Note by: Gaspar Cola, MD Date: 07/25/2020; Time: 6:17 PM

## 2020-07-25 ENCOUNTER — Encounter: Payer: Self-pay | Admitting: Pain Medicine

## 2020-07-25 ENCOUNTER — Ambulatory Visit: Payer: Managed Care, Other (non HMO) | Attending: Pain Medicine | Admitting: Pain Medicine

## 2020-07-25 VITALS — BP 162/90 | HR 97 | Temp 98.4°F | Ht 67.0 in | Wt 194.0 lb

## 2020-07-25 DIAGNOSIS — R519 Headache, unspecified: Secondary | ICD-10-CM | POA: Diagnosis present

## 2020-07-25 DIAGNOSIS — M25552 Pain in left hip: Secondary | ICD-10-CM | POA: Insufficient documentation

## 2020-07-25 DIAGNOSIS — I739 Peripheral vascular disease, unspecified: Secondary | ICD-10-CM

## 2020-07-25 DIAGNOSIS — Z7901 Long term (current) use of anticoagulants: Secondary | ICD-10-CM | POA: Diagnosis present

## 2020-07-25 DIAGNOSIS — M792 Neuralgia and neuritis, unspecified: Secondary | ICD-10-CM

## 2020-07-25 DIAGNOSIS — G894 Chronic pain syndrome: Secondary | ICD-10-CM

## 2020-07-25 DIAGNOSIS — B0221 Postherpetic geniculate ganglionitis: Secondary | ICD-10-CM | POA: Insufficient documentation

## 2020-07-25 DIAGNOSIS — G518 Other disorders of facial nerve: Secondary | ICD-10-CM

## 2020-07-25 DIAGNOSIS — G8929 Other chronic pain: Secondary | ICD-10-CM

## 2020-07-25 MED ORDER — OXYCODONE HCL 5 MG PO TABS: 5.0000 mg | ORAL_TABLET | Freq: Two times a day (BID) | ORAL | 0 refills | Status: DC | PRN

## 2020-07-25 MED ORDER — PREGABALIN 225 MG PO CAPS: 225.0000 mg | ORAL_CAPSULE | Freq: Three times a day (TID) | ORAL | 2 refills | Status: DC

## 2020-07-25 NOTE — Patient Instructions (Signed)
____________________________________________________________________________________________  Preparing for Procedure with Sedation  Procedure appointments are limited to planned procedures: . No Prescription Refills. . No disability issues will be discussed. . No medication changes will be discussed.  Instructions: . Oral Intake: Do not eat or drink anything for at least 8 hours prior to your procedure. (Exception: Blood Pressure Medication. See below.) . Transportation: Unless otherwise stated by your physician, you may drive yourself after the procedure. . Blood Pressure Medicine: Do not forget to take your blood pressure medicine with a sip of water the morning of the procedure. If your Diastolic (lower reading)is above 100 mmHg, elective cases will be cancelled/rescheduled. . Blood thinners: These will need to be stopped for procedures. Notify our staff if you are taking any blood thinners. Depending on which one you take, there will be specific instructions on how and when to stop it. . Diabetics on insulin: Notify the staff so that you can be scheduled 1st case in the morning. If your diabetes requires high dose insulin, take only  of your normal insulin dose the morning of the procedure and notify the staff that you have done so. . Preventing infections: Shower with an antibacterial soap the morning of your procedure. . Build-up your immune system: Take 1000 mg of Vitamin C with every meal (3 times a day) the day prior to your procedure. . Antibiotics: Inform the staff if you have a condition or reason that requires you to take antibiotics before dental procedures. . Pregnancy: If you are pregnant, call and cancel the procedure. . Sickness: If you have a cold, fever, or any active infections, call and cancel the procedure. . Arrival: You must be in the facility at least 30 minutes prior to your scheduled procedure. . Children: Do not bring children with you. . Dress appropriately:  Bring dark clothing that you would not mind if they get stained. . Valuables: Do not bring any jewelry or valuables.  Reasons to call and reschedule or cancel your procedure: (Following these recommendations will minimize the risk of a serious complication.) . Surgeries: Avoid having procedures within 2 weeks of any surgery. (Avoid for 2 weeks before or after any surgery). . Flu Shots: Avoid having procedures within 2 weeks of a flu shots or . (Avoid for 2 weeks before or after immunizations). . Barium: Avoid having a procedure within 7-10 days after having had a radiological study involving the use of radiological contrast. (Myelograms, Barium swallow or enema study). . Heart attacks: Avoid any elective procedures or surgeries for the initial 6 months after a "Myocardial Infarction" (Heart Attack). . Blood thinners: It is imperative that you stop these medications before procedures. Let us know if you if you take any blood thinner.  . Infection: Avoid procedures during or within two weeks of an infection (including chest colds or gastrointestinal problems). Symptoms associated with infections include: Localized redness, fever, chills, night sweats or profuse sweating, burning sensation when voiding, cough, congestion, stuffiness, runny nose, sore throat, diarrhea, nausea, vomiting, cold or Flu symptoms, recent or current infections. It is specially important if the infection is over the area that we intend to treat. . Heart and lung problems: Symptoms that may suggest an active cardiopulmonary problem include: cough, chest pain, breathing difficulties or shortness of breath, dizziness, ankle swelling, uncontrolled high or unusually low blood pressure, and/or palpitations. If you are experiencing any of these symptoms, cancel your procedure and contact your primary care physician for an evaluation.  Remember:  Regular Business hours are:    Monday to Thursday 8:00 AM to 4:00 PM  Provider's  Schedule: Franky Reier, MD:  Procedure days: Tuesday and Thursday 7:30 AM to 4:00 PM  Bilal Lateef, MD:  Procedure days: Monday and Wednesday 7:30 AM to 4:00 PM ____________________________________________________________________________________________   ____________________________________________________________________________________________  Blood Thinners  IMPORTANT NOTICE:  If you take any of these, make sure to notify the nursing staff.  Failure to do so may result in injury.  Recommended time intervals to stop and restart blood-thinners, before & after invasive procedures  Generic Name Brand Name Stop Time. Must be stopped at least this long before procedures. After procedures, wait at least this long before re-starting.  Abciximab Reopro 15 days 2 hrs  Alteplase Activase 10 days 10 days  Anagrelide Agrylin    Apixaban Eliquis 3 days 6 hrs  Cilostazol Pletal 3 days 5 hrs  Clopidogrel Plavix 7-10 days 2 hrs  Dabigatran Pradaxa 5 days 6 hrs  Dalteparin Fragmin 24 hours 4 hrs  Dipyridamole Aggrenox 11days 2 hrs  Edoxaban Lixiana; Savaysa 3 days 2 hrs  Enoxaparin  Lovenox 24 hours 4 hrs  Eptifibatide Integrillin 8 hours 2 hrs  Fondaparinux  Arixtra 72 hours 12 hrs  Prasugrel Effient 7-10 days 6 hrs  Reteplase Retavase 10 days 10 days  Rivaroxaban Xarelto 3 days 6 hrs  Ticagrelor Brilinta 5-7 days 6 hrs  Ticlopidine Ticlid 10-14 days 2 hrs  Tinzaparin Innohep 24 hours 4 hrs  Tirofiban Aggrastat 8 hours 2 hrs  Warfarin Coumadin 5 days 2 hrs   Other medications with blood-thinning effects  Product indications Generic (Brand) names Note  Cholesterol Lipitor Stop 4 days before procedure  Blood thinner (injectable) Heparin (LMW or LMWH Heparin) Stop 24 hours before procedure  Cancer Ibrutinib (Imbruvica) Stop 7 days before procedure  Malaria/Rheumatoid Hydroxychloroquine (Plaquenil) Stop 11 days before procedure  Thrombolytics  10 days before or after procedures    Over-the-counter (OTC) Products with blood-thinning effects  Product Common names Stop Time  Aspirin > 325 mg Goody Powders, Excedrin, etc. 11 days  Aspirin ? 81 mg  7 days  Fish oil  4 days  Garlic supplements  7 days  Ginkgo biloba  36 hours  Ginseng  24 hours  NSAIDs Ibuprofen, Naprosyn, etc. 3 days  Vitamin E  4 days   ____________________________________________________________________________________________   

## 2020-07-25 NOTE — Progress Notes (Signed)
Nursing Pain Medication Assessment:  Safety precautions to be maintained throughout the outpatient stay will include: orient to surroundings, keep bed in low position, maintain call bell within reach at all times, provide assistance with transfer out of bed and ambulation.  Medication Inspection Compliance: Pill count conducted under aseptic conditions, in front of the patient. Neither the pills nor the bottle was removed from the patient's sight at any time. Once count was completed pills were immediately returned to the patient in their original bottle.  Medication: Oxycodone IR Pill/Patch Count: 21 of 60 pills remain Pill/Patch Appearance: Markings consistent with prescribed medication Bottle Appearance: Standard pharmacy container. Clearly labeled. Filled Date: 7 / 42 / 21 Last Medication intake:  YesterdaySafety precautions to be maintained throughout the outpatient stay will include: orient to surroundings, keep bed in low position, maintain call bell within reach at all times, provide assistance with transfer out of bed and ambulation.

## 2020-07-27 NOTE — Progress Notes (Signed)
Tried to call both numbers no answer, mailbox full

## 2020-07-28 ENCOUNTER — Encounter: Payer: Self-pay | Admitting: Cardiovascular Disease

## 2020-07-28 ENCOUNTER — Other Ambulatory Visit: Payer: Self-pay

## 2020-07-28 ENCOUNTER — Ambulatory Visit (INDEPENDENT_AMBULATORY_CARE_PROVIDER_SITE_OTHER): Payer: Managed Care, Other (non HMO) | Admitting: Cardiovascular Disease

## 2020-07-28 VITALS — BP 180/110 | HR 98 | Ht 67.0 in | Wt 194.0 lb

## 2020-07-28 DIAGNOSIS — I2510 Atherosclerotic heart disease of native coronary artery without angina pectoris: Secondary | ICD-10-CM

## 2020-07-28 DIAGNOSIS — Z720 Tobacco use: Secondary | ICD-10-CM | POA: Diagnosis not present

## 2020-07-28 DIAGNOSIS — I10 Essential (primary) hypertension: Secondary | ICD-10-CM

## 2020-07-28 DIAGNOSIS — I739 Peripheral vascular disease, unspecified: Secondary | ICD-10-CM

## 2020-07-28 DIAGNOSIS — I6523 Occlusion and stenosis of bilateral carotid arteries: Secondary | ICD-10-CM

## 2020-07-28 DIAGNOSIS — Z951 Presence of aortocoronary bypass graft: Secondary | ICD-10-CM

## 2020-07-28 NOTE — Progress Notes (Signed)
Cardiology Office Note   Date:  07/28/2020   ID:  Erika [name], Erika [name] 11/27/1971, MRN [id number]  PCP:  Libby Maw, MD Cardiologist:   [name], MD   Chief Complaint  Patient presents with  . office visit    F/U after U/S of LE's; Meds verbally reviewed with patient.      History of Present Illness: Erika [name] is a 49 y.o. female who presents for a follow-up visit  regarding extensive cardiovascular history. She has known history of coronary artery disease with previous non-ST elevation myocardial infarction in 2017.  She was found to have three-vessel coronary artery disease at that time and underwent CABG and right carotid endarterectomy. She was rehospitalized in September, 2017 with chest pain in the setting of uncontrolled hypertension. Cardiac catheterization showed patent grafts . The LAD had diffuse disease distal to the anastomosis and was very small in caliber. Ejection fraction was normal with mildly elevated left ventricular end-diastolic pressure.   She has history of difficult to control hypertension with labile blood pressure.  No evidence of renal artery stenosis on previous angiography.  She has known history of severe mixed hyperlipidemia .  She is known to have peripheral arterial disease with previous stenting of the left SFA. She was subsequently found to have occluded left SFA.  She started having right leg claudication recently and underwent repeat noninvasive vascular testing which showed a drop in ABI on the right from normal to the 0.5 range with evidence of right SFA occlusion which is new. She now has bilateral SFA occlusion with moderately reduced ABI. She has severe claudication walking short distance. No rest pain extremity ulceration. Unfortunately, she has not been able to quit smoking. She denies chest pain or shortness of breath.  Past Medical History:  Diagnosis Date  . Arthralgia of temporomandibular joint   .  CAD, multiple vessel    a. cath 06/29/16: ostLM 40%, ostLAD 40%, pLAD 95%, ost-pLCx 60%, pLCx 95%, mLCx 60%, mRCA 95%, D2 50%, LVSF nl;  b. 07/2016 CABG x 4 (LIMA->LAD, VG->Diag, VG->OM, VG->RCA); c. 08/2016 Cath: 3VD w/ 4/4 patent grafts. LAD distal to LIMA has diff dzs->Med rx.  . Carotid arterial disease ([place])    a. 07/2016 s/p R CEA.  . Clotting disorder ([place])   . Depression   . Diastolic dysfunction    a. echo 06/28/16: EF 50-55%, mild inf wall HK, GR1DD, mild MR, RV sys fxn nl, mildly dilated LA, PASP nl  . Fatty liver disease, nonalcoholic 6644  . History of blood transfusion    with heart surgery  . HLD (hyperlipidemia)   . Labile hypertension    a. prior renal ngiogram negative for RAS in 03/2016; b. catecholamines and metanephrines normal, mildly elevated renin with normal aldosterone and normal ratio in 02/2016  . Myocardial infarction ([place]) 2017  . Obesity   . PAD (peripheral artery disease) ([facility])    in both legs  . PTSD (post-traumatic stress disorder)   . Tobacco abuse    In 2018 - pt cut back from 2 ppd to 0.5 ppd.- still 1/2 ppd as of 04/22/20  . Type 2 diabetes mellitus [facility]) January 2017    Past Surgical History:  Procedure Laterality Date  . ABDOMINAL AORTOGRAM W/LOWER EXTREMITY [name] 10/15/2018   Procedure: ABDOMINAL AORTOGRAM W/LOWER EXTREMITY;  Surgeon: Wellington Hampshire, MD;  Location: [place] [facility];  Service: Cardiovascular;  Laterality: [name];  . ABDOMINAL AORTOGRAM W/LOWER EXTREMITY Bilateral 08/19/2019  Procedure: ABDOMINAL AORTOGRAM W/LOWER EXTREMITY;  Surgeon: Wellington Hampshire, MD;  Location: [place] [facility];  Service: Cardiovascular;  Laterality: Bilateral;  . CARDIAC CATHETERIZATION [name] 06/29/2016   Procedure: Left Heart Cath and Coronary Angiography;  Surgeon: Minna Merritts, MD;  Location: [place] [facility];  Service: Cardiovascular;  Laterality: [name];  . CARDIAC CATHETERIZATION [name] 08/29/2016   Procedure: Left Heart Cath and Cors/Grafts Angiography;   Surgeon: Wellington Hampshire, MD;  Location: [place] [facility];  Service: Cardiovascular;  Laterality: [name];  . CESAREAN SECTION    . CHOLECYSTECTOMY    . CORONARY ARTERY BYPASS GRAFT [name] 07/06/2016   Procedure: CORONARY ARTERY BYPASS GRAFTING (CABG) x four, using left internal mammary artery and right leg greater saphenous vein harvested endoscopically;  Surgeon: Ivin Poot, MD;  Location: [place];  Service: Open Heart Surgery;  Laterality: [name];  . ENDARTERECTOMY Right 07/06/2016   Procedure: ENDARTERECTOMY CAROTID;  Surgeon: Rosetta Posner, MD;  Location: [place];  Service: Vascular;  Laterality: Right;  . ENDARTERECTOMY Right 04/27/2020   Procedure: REDO OF RIGHT ENDARTERECTOMY CAROTID;  Surgeon: Rosetta Posner, MD;  Location: [place];  Service: Vascular;  Laterality: Right;  . PERIPHERAL VASCULAR CATHETERIZATION [name] 04/18/2016   Procedure: Renal Angiography;  Surgeon: Wellington Hampshire, MD;  Location: [place] [facility];  Service: Cardiovascular;  Laterality: [name];  . PERIPHERAL VASCULAR INTERVENTION Left 10/15/2018   Procedure: PERIPHERAL VASCULAR INTERVENTION;  Surgeon: Wellington Hampshire, MD;  Location: [place] [facility];  Service: Cardiovascular;  Laterality: Left;  Left superficial femoral  . TEE WITHOUT CARDIOVERSION [name] 07/06/2016   Procedure: TRANSESOPHAGEAL ECHOCARDIOGRAM (TEE);  Surgeon: Ivin Poot, MD;  Location: [place];  Service: Open Heart Surgery;  Laterality: [name];  . TONSILLECTOMY       Current Outpatient Medications  Medication Sig Dispense Refill  . aspirin EC 81 MG tablet Take 1 tablet (81 mg total) by mouth daily. 90 tablet 3  . benztropine (COGENTIN) 0.5 MG tablet Take 1 tablet (0.5 mg total) by mouth at bedtime. 90 tablet 0  . carvedilol (COREG) 25 MG tablet Take 1 tablet (25 mg total) by mouth 2 (two) times daily with a meal. 60 tablet 11  . chlorproMAZINE (THORAZINE) 100 MG tablet Take 1 tablet (100 mg total) by mouth at bedtime. 30 tablet 0  . clonazePAM (KLONOPIN) 0.5 MG  tablet Take 1 tablet (0.5 mg total) by mouth 3 (three) times daily as needed for anxiety. 90 tablet 2  . dapagliflozin propanediol (FARXIGA) 5 MG TABS tablet Take 5 mg by mouth daily before breakfast. 30 tablet 11  . Evolocumab (REPATHA SURECLICK) 267 MG/ML SOAJ Inject 1 pen into the skin every 14 (fourteen) days. 2 pen 11  . FLUoxetine HCl 60 MG TABS Take 60 mg by mouth daily. 90 tablet 0  . insulin glargine, 2 Unit Dial, (TOUJEO MAX SOLOSTAR) 300 UNIT/ML Solostar Pen Inject 110 Units into the skin every morning.    . insulin lispro (HUMALOG KWIKPEN) 100 UNIT/ML KwikPen Inject 0.03 mLs (3 Units total) into the skin 3 (three) times daily with meals. Take only if blood sugar is over 250.  Also pen needles 4/day 15 mL 11  . Insulin Syringe-Needle U-100 29G X 1/2" 0.3 ML MISC 1 each by Does not apply route 2 (two) times daily. 30 each 0  . isosorbide mononitrate (IMDUR) 60 MG 24 hr tablet Take 1 tablet (60 mg total) by mouth daily. 90 tablet 1  . lamoTRIgine (  LAMICTAL) 200 MG tablet Take 1 tablet (200 mg total) by mouth daily. 90 tablet 0  . losartan (COZAAR) 100 MG tablet Take 1 tablet (100 mg total) by mouth daily. 30 tablet 11  . magic mouthwash SOLN Take 15 mLs by mouth 3 (three) times daily as needed for mouth pain. 250 mL 1  . naloxone (NARCAN) nasal spray 4 mg/0.1 mL 4 mg (contents of 1 nasal spray) as a single dose in one nostril; may repeat every 2 to 3 minutes in alternating nostrils until medical assistance becomes available 1 kit 0  . nitroGLYCERIN (NITROSTAT) 0.4 MG SL tablet Place 1 tablet (0.4 mg total) under the tongue every 5 (five) minutes as needed for chest pain. 25 tablet 98  . ondansetron (ZOFRAN) 4 MG tablet Take 1 tablet (4 mg total) by mouth daily as needed. 20 tablet 11  . [START ON 08/07/2020] oxyCODONE (OXY IR/ROXICODONE) 5 MG immediate release tablet Take 1 tablet (5 mg total) by mouth 2 (two) times daily as needed for severe pain. Must last 30 days. 60 tablet 0  . [START ON  09/06/2020] oxyCODONE (OXY IR/ROXICODONE) 5 MG immediate release tablet Take 1 tablet (5 mg total) by mouth 2 (two) times daily as needed for severe pain. Must last 30 days. 60 tablet 0  . [START ON 10/06/2020] oxyCODONE (OXY IR/ROXICODONE) 5 MG immediate release tablet Take 1 tablet (5 mg total) by mouth 2 (two) times daily as needed for severe pain. Must last 30 days. 60 tablet 0  . [START ON 08/07/2020] pregabalin (LYRICA) 225 MG capsule Take 1 capsule (225 mg total) by mouth 3 (three) times daily. 90 capsule 2  . spironolactone (ALDACTONE) 25 MG tablet Take 1 tablet (25 mg total) by mouth daily. 90 tablet 3  . albuterol (PROVENTIL HFA;VENTOLIN HFA) 108 (90 Base) MCG/ACT inhaler Inhale 2 puffs into the lungs every 6 (six) hours as needed for wheezing. 1 Inhaler 0  . amLODipine (NORVASC) 10 MG tablet Take 1 tablet (10 mg total) by mouth daily. 90 tablet 3  . clopidogrel (PLAVIX) 75 MG tablet TAKE 1 TABLET BY MOUTH EVERY DAY (Patient not taking: Reported on 07/28/2020) 30 tablet 3  . insulin glargine, 2 Unit Dial, (TOUJEO MAX SOLOSTAR) 300 UNIT/ML Solostar Pen Inject 110 Units into the skin every morning. (Patient taking differently: Inject 110 Units into the skin in the morning. ) 4 pen 11   No current facility-administered medications for this visit.    Allergies:   Chantix [varenicline tartrate]    Social History:  The patient  reports that she has been smoking cigarettes. She has a 6.75 pack-year smoking history. She has never used smokeless tobacco. She reports previous alcohol use. She reports that she does not use drugs.   Family History:  The patient's family history includes Alcohol abuse in her father; Anxiety disorder in her sister; Diabetes in her father and mother; Drug abuse in her father; Heart disease in her father; Stroke in her sister. She was adopted.    ROS:  Please see the history of present illness.   Otherwise, review of systems are positive for none.   All other systems are  reviewed and negative.    PHYSICAL EXAM: VS:  BP (!) 180/110 (BP Location: Left Arm, Patient Position: Sitting, Cuff Size: Normal)   Pulse 98   Ht '5\' 7"'$  (1.702 m)   Wt 194 lb (88 kg)   SpO2 92%   BMI 30.38 kg/m  , BMI Body mass index  is 30.38 kg/m. GEN: Well nourished, well developed, in no acute distress  HEENT: normal  Neck: no JVD, carotid bruits, or masses Cardiac: RRR; no  rubs, or gallops,no edema . There is 1/6 systolic flow murmur in the pulmonic area Respiratory:  clear to auscultation bilaterally, normal work of breathing GI: soft, nontender, nondistended, + BS MS: no deformity or atrophy  Skin: warm and dry, no rash Neuro:  Strength and sensation are intact Psych: euthymic mood, full affect   EKG:  EKG is  ordered today. EKG showed normal sinus rhythm with old septal infarct.   Recent Labs: 09/15/2019: TSH 1.693 04/22/2020: ALT 23 04/28/2020: BUN 9; Creatinine, Ser 0.64; Hemoglobin 13.8; Platelets 370; Potassium 4.1; Sodium 135    Lipid Panel    Component Value Date/Time   CHOL 157 09/07/2019 0846   CHOL 121 08/21/2016 0817   TRIG 191.0 (H) 09/07/2019 0846   HDL 38.70 (L) 09/07/2019 0846   HDL 24 (L) 08/21/2016 0817   CHOLHDL 4 09/07/2019 0846   VLDL 38.2 09/07/2019 0846   LDLCALC 80 09/07/2019 0846   LDLCALC 64 08/21/2016 0817   LDLDIRECT 116.0 06/29/2019 1040      Wt Readings from Last 3 Encounters:  07/28/20 194 lb (88 kg)  07/25/20 194 lb (88 kg)  06/14/20 199 lb 2 oz (90.3 kg)         ASSESSMENT AND PLAN:  1.  Peripheral arterial disease with severe bilateral calf claudication with new occluded right SFA. She now has bilateral SFA occlusion. She has very aggressive atherosclerosis and this is very concerning. I strongly believe she has to quit smoking in order to prevent progression to critical limb ischemia and also if we are to consider any revascularization. Any revascularization modality would be associated with high failure rate. I  advised her to start a walking program if possible although she reports severe symptoms. She is considering getting a wheelchair.   2. Coronary artery disease involving native coronary arteries with stable angina: Status post CABG in July of 2017.   Symptoms are controlled with long-acting nitroglycerin and a beta-blocker.  3. Carotid artery disease status post right carotid endarterectomy. She had recurrent severe disease in the right side and had to undergo redo right carotid endarterectomy by Dr. Donnetta Hutching this year.  4. Essential hypertension:  Blood pressure continues to be significantly elevated in spite of multiple blood pressure medications.  Not really sure what else to add.  She will be a candidate for renal denervation if the therapy becomes approved.  5. Hyperlipidemia: Continue treatment with Repatha. Most recent lipid profile showed an LDL of 80.  6. Tobacco use: I again had a prolonged discussion with her about the importance of smoking cessation.  The patient will require dental extraction in the near future. No contraindication from a cardiac standpoint. She can hold Plavix 5 days before.  Disposition:   FU with me in 6 months  Signed,  [name], MD  07/28/2020 2:36 PM    [place]

## 2020-07-28 NOTE — Patient Instructions (Signed)
Medication Instructions:  Your physician recommends that you continue on your current medications as directed. Please refer to the Current Medication list given to you today.  *If you need a refill on your cardiac medications before your next appointment, please call your pharmacy*   Lab Work: None ordered If you have labs (blood work) drawn today and your tests are completely normal, you will receive your results only by: Marland Kitchen MyChart Message (if you have MyChart) OR . A paper copy in the mail If you have any lab test that is abnormal or we need to change your treatment, we will call you to review the results.   Testing/Procedures: None ordered   Follow-Up: At [facility], you and your health needs are our priority.  As part of our continuing mission to provide you with exceptional heart care, we have created designated Provider Care Teams.  These Care Teams include your primary Cardiologist (physician) and Advanced Practice Providers (APPs -  Physician Assistants and Nurse Practitioners) who all work together to provide you with the care you need, when you need it.  We recommend signing up for the patient portal called "MyChart".  Sign up information is provided on this After Visit Summary.  MyChart is used to connect with patients for Virtual Visits (Telemedicine).  Patients are able to view lab/test results, encounter notes, upcoming appointments, etc.  Non-urgent messages can be sent to your provider as well.   To learn more about what you can do with MyChart, go to [email or web address].    Your next appointment:   6 month(s)  The format for your next appointment:   In Person  Provider:    You may see [name], MD or one of the following Advanced Practice Providers on your designated Care Team:    Murray Hodgkins, NP  Christell Faith, PA-C  Marrianne Mood, PA-C    Other Instructions The oral surgeons office: Dr. Consuella Lose [facility]illof[facility]  surgery [phone]  Dr. Fletcher Anon is ok with you have a wheel chair. The order for medical equipment will need to come form your primary care physician.     Coping with Quitting Smoking  Quitting smoking is a physical and mental challenge. You will face cravings, withdrawal symptoms, and temptation. Before quitting, work with your health care provider to make a plan that can help you cope. Preparation can help you quit and keep you from giving in. How can I cope with cravings? Cravings usually last for 5-10 minutes. If you get through it, the craving will pass. Consider taking the following actions to help you cope with cravings:  Keep your mouth busy: ? Chew sugar-free gum. ? Suck on hard candies or a straw. ? Brush your teeth.  Keep your hands and body busy: ? Immediately change to a different activity when you feel a craving. ? Squeeze or play with a ball. ? Do an activity or a hobby, like making bead jewelry, practicing needlepoint, or working with wood. ? Mix up your normal routine. ? Take a short exercise break. Go for a quick walk or run up and down stairs. ? Spend time in public places where smoking is not allowed.  Focus on doing something kind or helpful for someone else.  Call a friend or family member to talk during a craving.  Join a support group.  Call a quit line, such as 1-800-QUIT-NOW.  Talk with your health care provider about medicines that might help you cope with cravings and make  quitting easier for you. How can I deal with withdrawal symptoms? Your body may experience negative effects as it tries to get used to not having nicotine in the system. These effects are called withdrawal symptoms. They may include:  Feeling hungrier than normal.  Trouble concentrating.  Irritability.  Trouble sleeping.  Feeling depressed.  Restlessness and agitation.  Craving a cigarette. To manage withdrawal symptoms:  Avoid places, people, and activities that  trigger your cravings.  Remember why you want to quit.  Get plenty of sleep.  Avoid coffee and other caffeinated drinks. These may worsen some of your symptoms. How can I handle social situations? Social situations can be difficult when you are quitting smoking, especially in the first few weeks. To manage this, you can:  Avoid parties, bars, and other social situations where people might be smoking.  Avoid alcohol.  Leave right away if you have the urge to smoke.  Explain to your family and friends that you are quitting smoking. Ask for understanding and support.  Plan activities with friends or family where smoking is not an option. What are some ways I can cope with stress? Wanting to smoke may cause stress, and stress can make you want to smoke. Find ways to manage your stress. Relaxation techniques can help. For example:  Breathe slowly and deeply, in through your nose and out through your mouth.  Listen to soothing, relaxing music.  Talk with a family member or friend about your stress.  Light a candle.  Soak in a bath or take a shower.  Think about a peaceful place. What are some ways I can prevent weight gain? Be aware that many people gain weight after they quit smoking. However, not everyone does. To keep from gaining weight, have a plan in place before you quit and stick to the plan after you quit. Your plan should include:  Having healthy snacks. When you have a craving, it may help to: ? Eat plain popcorn, crunchy carrots, celery, or other cut vegetables. ? Chew sugar-free gum.  Changing how you eat: ? Eat small portion sizes at meals. ? Eat 4-6 small meals throughout the day instead of 1-2 large meals a day. ? Be mindful when you eat. Do not watch television or do other things that might distract you as you eat.  Exercising regularly: ? Make time to exercise each day. If you do not have time for a long workout, do short bouts of exercise for 5-10 minutes  several times a day. ? Do some form of strengthening exercise, like weight lifting, and some form of aerobic exercise, like running or swimming.  Drinking plenty of water or other low-calorie or no-calorie drinks. Drink 6-8 glasses of water daily, or as much as instructed by your health care provider. Summary  Quitting smoking is a physical and mental challenge. You will face cravings, withdrawal symptoms, and temptation to smoke again. Preparation can help you as you go through these challenges.  You can cope with cravings by keeping your mouth busy (such as by chewing gum), keeping your body and hands busy, and making calls to family, friends, or a helpline for people who want to quit smoking.  You can cope with withdrawal symptoms by avoiding places where people smoke, avoiding drinks with caffeine, and getting plenty of rest.  Ask your health care provider about the different ways to prevent weight gain, avoid stress, and handle social situations. This information is not intended to replace advice given to you  by your health care provider. Make sure you discuss any questions you have with your health care provider. Document Revised: 11/22/2017 Document Reviewed: 12/07/2016 Elsevier Patient Education  2020 [employer].

## 2020-08-02 ENCOUNTER — Ambulatory Visit (HOSPITAL_BASED_OUTPATIENT_CLINIC_OR_DEPARTMENT_OTHER): Payer: Managed Care, Other (non HMO) | Admitting: Pain Medicine

## 2020-08-02 ENCOUNTER — Other Ambulatory Visit: Payer: Self-pay

## 2020-08-02 ENCOUNTER — Ambulatory Visit
Admission: RE | Admit: 2020-08-02 | Discharge: 2020-08-02 | Disposition: A | Discharge status: Home / Self Care | Payer: Managed Care, Other (non HMO) | Source: Ambulatory Visit | Attending: Pain Medicine | Admitting: Pain Medicine

## 2020-08-02 ENCOUNTER — Encounter: Payer: Self-pay | Admitting: Pain Medicine

## 2020-08-02 VITALS — BP 144/74 | HR 89 | Temp 97.3°F | Resp 18 | Ht 67.0 in | Wt 194.0 lb

## 2020-08-02 DIAGNOSIS — I252 Old myocardial infarction: Secondary | ICD-10-CM

## 2020-08-02 DIAGNOSIS — K047 Periapical abscess without sinus: Secondary | ICD-10-CM | POA: Insufficient documentation

## 2020-08-02 DIAGNOSIS — G8929 Other chronic pain: Secondary | ICD-10-CM | POA: Insufficient documentation

## 2020-08-02 DIAGNOSIS — M67952 Unspecified disorder of synovium and tendon, left thigh: Secondary | ICD-10-CM | POA: Diagnosis present

## 2020-08-02 DIAGNOSIS — S025XXA Fracture of tooth (traumatic), initial encounter for closed fracture: Secondary | ICD-10-CM | POA: Insufficient documentation

## 2020-08-02 DIAGNOSIS — M7602 Gluteal tendinitis, left hip: Secondary | ICD-10-CM | POA: Diagnosis present

## 2020-08-02 DIAGNOSIS — M25552 Pain in left hip: Secondary | ICD-10-CM | POA: Insufficient documentation

## 2020-08-02 DIAGNOSIS — Z7901 Long term (current) use of anticoagulants: Secondary | ICD-10-CM

## 2020-08-02 DIAGNOSIS — M1612 Unilateral primary osteoarthritis, left hip: Secondary | ICD-10-CM | POA: Insufficient documentation

## 2020-08-02 HISTORY — DX: Old myocardial infarction: I25.2

## 2020-08-02 MED ORDER — METHYLPREDNISOLONE ACETATE 80 MG/ML IJ SUSP
INTRAMUSCULAR | Status: AC
  Filled 2020-08-02: qty 1

## 2020-08-02 MED ORDER — IOHEXOL 180 MG/ML  SOLN
10.0000 mL | Freq: Once | INTRAMUSCULAR | Status: AC
  Administered 2020-08-02: 10 mL via EPIDURAL

## 2020-08-02 MED ORDER — FENTANYL CITRATE (PF) 100 MCG/2ML IJ SOLN
25.0000 ug | INTRAMUSCULAR | Status: DC | PRN
  Administered 2020-08-02: 50 ug via INTRAVENOUS

## 2020-08-02 MED ORDER — LACTATED RINGERS IV SOLN
1000.0000 mL | Freq: Once | INTRAVENOUS | Status: AC
  Administered 2020-08-02: 1000 mL via INTRAVENOUS

## 2020-08-02 MED ORDER — ROPIVACAINE HCL 2 MG/ML IJ SOLN
INTRAMUSCULAR | Status: AC
  Filled 2020-08-02: qty 10

## 2020-08-02 MED ORDER — FENTANYL CITRATE (PF) 100 MCG/2ML IJ SOLN
INTRAMUSCULAR | Status: AC
  Filled 2020-08-02: qty 2

## 2020-08-02 MED ORDER — MIDAZOLAM HCL 5 MG/5ML IJ SOLN
1.0000 mg | INTRAMUSCULAR | Status: DC | PRN
  Administered 2020-08-02 (×2): 2 mg via INTRAVENOUS

## 2020-08-02 MED ORDER — LIDOCAINE HCL 2 % IJ SOLN
20.0000 mL | Freq: Once | INTRAMUSCULAR | Status: AC
  Administered 2020-08-02: 400 mg

## 2020-08-02 MED ORDER — ROPIVACAINE HCL 2 MG/ML IJ SOLN
9.0000 mL | Freq: Once | INTRAMUSCULAR | Status: AC
  Administered 2020-08-02: 9 mL via INTRA_ARTICULAR

## 2020-08-02 MED ORDER — LIDOCAINE HCL 2 % IJ SOLN
INTRAMUSCULAR | Status: AC
  Filled 2020-08-02: qty 10

## 2020-08-02 MED ORDER — METHYLPREDNISOLONE ACETATE 80 MG/ML IJ SUSP
80.0000 mg | Freq: Once | INTRAMUSCULAR | Status: AC
  Administered 2020-08-02: 80 mg via INTRA_ARTICULAR

## 2020-08-02 MED ORDER — MIDAZOLAM HCL 5 MG/5ML IJ SOLN
INTRAMUSCULAR | Status: AC
  Filled 2020-08-02: qty 5

## 2020-08-02 MED ORDER — IOHEXOL 180 MG/ML  SOLN
INTRAMUSCULAR | Status: AC
  Filled 2020-08-02: qty 20

## 2020-08-02 NOTE — Patient Instructions (Signed)
____________________________________________________________________________________________  Post-Procedure Discharge Instructions  Instructions:  Apply ice:   Purpose: This will minimize any swelling and discomfort after procedure.   When: Day of procedure, as soon as you get home.  How: Fill a plastic sandwich bag with crushed ice. Cover it with a small towel and apply to injection site.  How long: (15 min on, 15 min off) Apply for 15 minutes then remove x 15 minutes.  Repeat sequence on day of procedure, until you go to bed.  Apply heat:   Purpose: To treat any soreness and discomfort from the procedure.  When: Starting the next day after the procedure.  How: Apply heat to procedure site starting the day following the procedure.  How long: May continue to repeat daily, until discomfort goes away.  Food intake: Start with clear liquids (like water) and advance to regular food, as tolerated.   Physical activities: Keep activities to a minimum for the first 8 hours after the procedure. After that, then as tolerated.  Driving: If you have received any sedation, be responsible and do not drive. You are not allowed to drive for 24 hours after having sedation.  Blood thinner: (Applies only to those taking blood thinners) You may restart your blood thinner 6 hours after your procedure.  Insulin: (Applies only to Diabetic patients taking insulin) As soon as you can eat, you may resume your normal dosing schedule.  Infection prevention: Keep procedure site clean and dry. Shower daily and clean area with soap and water.  Post-procedure Pain Diary: Extremely important that this be done correctly and accurately. Recorded information will be used to determine the next step in treatment. For the purpose of accuracy, follow these rules:  Evaluate only the area treated. Do not report or include pain from an untreated area. For the purpose of this evaluation, ignore all other areas of pain,  except for the treated area.  After your procedure, avoid taking a long nap and attempting to complete the pain diary after you wake up. Instead, set your alarm clock to go off every hour, on the hour, for the initial 8 hours after the procedure. Document the duration of the numbing medicine, and the relief you are getting from it.  Do not go to sleep and attempt to complete it later. It will not be accurate. If you received sedation, it is likely that you were given a medication that may cause amnesia. Because of this, completing the diary at a later time may cause the information to be inaccurate. This information is needed to plan your care.  Follow-up appointment: Keep your post-procedure follow-up evaluation appointment after the procedure (usually 2 weeks for most procedures, 6 weeks for radiofrequencies). DO NOT FORGET to bring you pain diary with you.   Expect: (What should I expect to see with my procedure?)  From numbing medicine (AKA: Local Anesthetics): Numbness or decrease in pain. You may also experience some weakness, which if present, could last for the duration of the local anesthetic.  Onset: Full effect within 15 minutes of injected.  Duration: It will depend on the type of local anesthetic used. On the average, 1 to 8 hours.   From steroids (Applies only if steroids were used): Decrease in swelling or inflammation. Once inflammation is improved, relief of the pain will follow.  Onset of benefits: Depends on the amount of swelling present. The more swelling, the longer it will take for the benefits to be seen. In some cases, up to 10 days.    Duration: Steroids will stay in the system x 2 weeks. Duration of benefits will depend on multiple posibilities including persistent irritating factors.  Side-effects: If present, they may typically last 2 weeks (the duration of the steroids).  Frequent: Cramps (if they occur, drink Gatorade and take over-the-counter Magnesium 450-500 mg  once to twice a day); water retention with temporary weight gain; increases in blood sugar; decreased immune system response; increased appetite.  Occasional: Facial flushing (red, warm cheeks); mood swings; menstrual changes.  Uncommon: Long-term decrease or suppression of natural hormones; bone thinning. (These are more common with higher doses or more frequent use. This is why we prefer that our patients avoid having any injection therapies in other practices.)   Very Rare: Severe mood changes; psychosis; aseptic necrosis.  From procedure: Some discomfort is to be expected once the numbing medicine wears off. This should be minimal if ice and heat are applied as instructed.  Call if: (When should I call?)  You experience numbness and weakness that gets worse with time, as opposed to wearing off.  New onset bowel or bladder incontinence. (Applies only to procedures done in the spine)  Emergency Numbers:  Durning business hours (Monday - Thursday, 8:00 AM - 4:00 PM) (Friday, 9:00 AM - 12:00 Noon): [phone]  After hours: [phone]  NOTE: If you are having a problem and are unable connect with, or to talk to a provider, then go to your nearest urgent care or emergency department. If the problem is serious and urgent, please call 911. ____________________________________________________________________________________________    

## 2020-08-02 NOTE — Progress Notes (Signed)
Safety precautions to be maintained throughout the outpatient stay will include: orient to surroundings, keep bed in low position, maintain call bell within reach at all times, provide assistance with transfer out of bed and ambulation.  

## 2020-08-02 NOTE — Progress Notes (Signed)
PROVIDER NOTE: Information contained herein reflects review and annotations entered in association with encounter. Interpretation of such information and data should be left to medically-trained personnel. Information provided to patient can be located elsewhere in the medical record under "Patient Instructions". Document created using STT-dictation technology, any transcriptional errors that may result from process are unintentional.    Patient: Erika [name]  Service Category: Procedure  Provider: Gaspar Cola, MD  DOB: 02-25-1971  DOS: 08/02/2020  Location: [facility]  MRN: [id number]  Setting: Ambulatory - outpatient  Referring Provider: Libby Maw  Type: Established Patient  Specialty: Interventional Pain Management  PCP: Libby Maw, MD   Primary Reason for Visit: Interventional Pain Management Treatment. CC: Hip Pain (left)  Procedure #1:  Anesthesia, Analgesia, Anxiolysis:  Type: Intra-Articular Hip Injection #1  Primary Purpose: Diagnostic Region: Posterolateral hip joint area. Level: Lower pelvic and hip joint level. Target Area: Superior aspect of the hip joint cavity, going thru the superior portion of the capsular ligament. Approach: Posterolateral approach. Laterality: Left  Type: Moderate (Conscious) Sedation combined with Local Anesthesia Indication(s): Analgesia and Anxiety Route: Intravenous (IV) IV Access: Secured Sedation: Meaningful verbal contact was maintained at all times during the procedure  Local Anesthetic: Lidocaine 1-2%  Position: Lateral Decubitus with bad side up Area Prepped: Entire Posterolateral hip area. DuraPrep (Iodine Povacrylex [0.7% available iodine] and Isopropyl Alcohol, 74% w/w)   Procedure #2:    Type: Gluteus Medius tendon injection #2  Primary Purpose: Therapeutic Region: Upper (proximal) Femoral Region Level: Hip Joint Target Area: Superior aspect of the hip joint cavity, going thru  the superior portion of the capsular ligament. Approach: Posterolateral approach Laterality: Left     Indications: 1. Chronic hip pain (Left)   2. Osteoarthritis of hip (Left)   3. Tendinopathy of gluteus medius (Left)   4. Gluteal tendonitis of buttock (Left)    Pain Score: Pre-procedure: 4 /10 Post-procedure: 0-No pain/10   Pre-op Assessment:  Erika [name] is a 49 y.o. (year old), female patient, seen today for interventional treatment. She  has a past surgical history that includes Cholecystectomy; Tonsillectomy; Cesarean section; Cardiac catheterization ([name], 04/18/2016); Cardiac catheterization ([name], 06/29/2016); Coronary artery bypass graft ([name], 07/06/2016); TEE without cardioversion ([name], 07/06/2016); Endarterectomy (Right, 07/06/2016); Cardiac catheterization ([name], 08/29/2016); ABDOMINAL AORTOGRAM W/LOWER EXTREMITY ([name], 10/15/2018); PERIPHERAL VASCULAR INTERVENTION (Left, 10/15/2018); ABDOMINAL AORTOGRAM W/LOWER EXTREMITY (Bilateral, 08/19/2019); and Endarterectomy (Right, 04/27/2020). Erika [name] has a current medication list which includes the following prescription(s): albuterol, amlodipine, aspirin ec, benztropine, carvedilol, chlorpromazine, clonazepam, clopidogrel, farxiga, repatha sureclick, fluoxetine hcl, toujeo max solostar, insulin lispro, insulin syringe-needle u-100, isosorbide mononitrate, lamotrigine, losartan, magic mouthwash, naloxone, nitroglycerin, ondansetron, [START ON 08/07/2020] oxycodone, [START ON 09/06/2020] oxycodone, [START ON 10/06/2020] oxycodone, [START ON 08/07/2020] pregabalin, spironolactone, and toujeo max solostar, and the following Facility-Administered Medications: fentanyl and midazolam. Her primarily concern today is the Hip Pain (left)  Initial Vital Signs:  Pulse/HCG Rate: 96ECG Heart Rate: 89 Temp: 97.9 F (36.6 C) Resp: 18 BP: (!) 154/91 SpO2: 90 %  BMI: Estimated body mass index is 30.38 kg/m as calculated from the following:   Height as of this  encounter: 5\' 7"  (1.702 m).   Weight as of this encounter: 194 lb (88 kg).  Risk Assessment: Allergies: Reviewed. She is allergic to chantix [varenicline tartrate].  Allergy Precautions: None required Coagulopathies: Reviewed. None identified.  Blood-thinner therapy: None at this time Active Infection(s): Reviewed. None identified. Erika [name] is afebrile  Site Confirmation: Erika [name] was asked  to confirm the procedure and laterality before marking the site Procedure checklist: Completed Consent: Before the procedure and under the influence of no sedative(s), amnesic(s), or anxiolytics, the patient was informed of the treatment options, risks and possible complications. To fulfill our ethical and legal obligations, as recommended by the American Medical Association's Code of Ethics, I have informed the patient of my clinical impression; the nature and purpose of the treatment or procedure; the risks, benefits, and possible complications of the intervention; the alternatives, including doing nothing; the risk(s) and benefit(s) of the alternative treatment(s) or procedure(s); and the risk(s) and benefit(s) of doing nothing. The patient was provided information about the general risks and possible complications associated with the procedure. These may include, but are not limited to: failure to achieve desired goals, infection, bleeding, organ or nerve damage, allergic reactions, paralysis, and death. In addition, the patient was informed of those risks and complications associated to the procedure, such as failure to decrease pain; infection; bleeding; organ or nerve damage with subsequent damage to sensory, motor, and/or autonomic systems, resulting in permanent pain, numbness, and/or weakness of one or several areas of the body; allergic reactions; (i.e.: anaphylactic reaction); and/or death. Furthermore, the patient was informed of those risks and complications associated with the medications.  These include, but are not limited to: allergic reactions (i.e.: anaphylactic or anaphylactoid reaction(s)); adrenal axis suppression; blood sugar elevation that in diabetics may result in ketoacidosis or comma; water retention that in patients with history of congestive heart failure may result in shortness of breath, pulmonary edema, and decompensation with resultant heart failure; weight gain; swelling or edema; medication-induced neural toxicity; particulate matter embolism and blood vessel occlusion with resultant organ, and/or nervous system infarction; and/or aseptic necrosis of one or more joints. Finally, the patient was informed that Medicine is not an exact science; therefore, there is also the possibility of unforeseen or unpredictable risks and/or possible complications that may result in a catastrophic outcome. The patient indicated having understood very clearly. We have given the patient no guarantees and we have made no promises. Enough time was given to the patient to ask questions, all of which were answered to the patient's satisfaction. Erika [name] has indicated that she wanted to continue with the procedure. Attestation: I, the ordering provider, attest that I have discussed with the patient the benefits, risks, side-effects, alternatives, likelihood of achieving goals, and potential problems during recovery for the procedure that I have provided informed consent. Date  Time: 08/02/2020 12:21 PM  Pre-Procedure Preparation:  Monitoring: As per clinic protocol. Respiration, ETCO2, SpO2, BP, heart rate and rhythm monitor placed and checked for adequate function Safety Precautions: Patient was assessed for positional comfort and pressure points before starting the procedure. Time-out: I initiated and conducted the "Time-out" before starting the procedure, as per protocol. The patient was asked to participate by confirming the accuracy of the "Time Out" information. Verification of the  correct person, site, and procedure were performed and confirmed by me, the nursing staff, and the patient. "Time-out" conducted as per Joint Commission's Universal Protocol (UP.01.01.01). Time: 39  Description of Procedure #1:  Safety Precautions: Aspiration looking for blood return was conducted prior to all injections. At no point did we inject any substances, as a needle was being advanced. No attempts were made at seeking any paresthesias. Safe injection practices and needle disposal techniques used. Medications properly checked for expiration dates. SDV (single dose vial) medications used. Description of the Procedure: Protocol guidelines were followed. The patient  was placed in position over the fluoroscopy table. The target area was identified and the area prepped in the usual manner. Skin & deeper tissues infiltrated with local anesthetic. Appropriate amount of time allowed to pass for local anesthetics to take effect. The procedure needles were then advanced to the target area. Proper needle placement secured. Negative aspiration confirmed. Solution injected in intermittent fashion, asking for systemic symptoms every 0.5cc of injectate. The needles were then removed and the area cleansed, making sure to leave some of the prepping solution back to take advantage of its long term bactericidal properties.  Start Time: 1323 hrs. Materials:  Needle(s) Type: Spinal Needle Gauge: 22G Length: 5.0-in Medication(s): Please see orders for medications and dosing details.  Imaging Guidance (Non-Spinal):          Type of Imaging Technique: Fluoroscopy Guidance (Non-Spinal) Indication(s): Assistance in needle guidance and placement for procedures requiring needle placement in or near specific anatomical locations not easily accessible without such assistance. Exposure Time: Please see nurses notes. Contrast: Before injecting any contrast, we confirmed that the patient did not have an allergy to  iodine, shellfish, or radiological contrast. Once satisfactory needle placement was completed at the desired level, radiological contrast was injected. Contrast injected under live fluoroscopy. No contrast complications. See chart for type and volume of contrast used. Fluoroscopic Guidance: I was personally present during the use of fluoroscopy. "Tunnel Vision Technique" used to obtain the best possible view of the target area. Parallax error corrected before commencing the procedure. "Direction-depth-direction" technique used to introduce the needle under continuous pulsed fluoroscopy. Once target was reached, antero-posterior, oblique, and lateral fluoroscopic projection used confirm needle placement in all planes. Images permanently stored in EMR. Interpretation: I personally interpreted the imaging intraoperatively. Adequate needle placement confirmed in multiple planes. Appropriate spread of contrast into desired area was observed. No evidence of afferent or efferent intravascular uptake. Permanent images saved into the patient's record.   Description of Procedure #2:  Description of the Procedure: Skin & deeper tissues infiltrated with local anesthetic. Appropriate amount of time allowed to pass for local anesthetics to take effect. The procedure needles were then advanced to the target area. Proper needle placement secured. Negative aspiration confirmed. Solution injected in intermittent fashion, asking for systemic symptoms every 0.5cc of injectate. The needles were then removed and the area cleansed, making sure to leave some of the prepping solution back to take advantage of its long term bactericidal properties.  Vitals:   08/02/20 1330 08/02/20 1340 08/02/20 1350 08/02/20 1400  BP: (!) 151/86 (!) 160/76 (!) 148/70 (!) 144/74  Pulse: 89     Resp: (!) 22 14 20 18   Temp:  (!) 97.5 F (36.4 C)  (!) 97.3 F (36.3 C)  SpO2: 97% 100% 100% 100%  Weight:      Height:         End Time: 1329  hrs.      Materials:  Needle(s) Type: Spinal Needle Gauge: 22G Length: 7-in Medication(s): Please see orders for medications and dosing details.  Imaging Guidance (Non-Spinal):          Type of Imaging Technique: Fluoroscopy Guidance (Non-Spinal) Indication(s): Assistance in needle guidance and placement for procedures requiring needle placement in or near specific anatomical locations not easily accessible without such assistance. Exposure Time: Please see nurses notes. Contrast: Before injecting any contrast, we confirmed that the patient did not have an allergy to iodine, shellfish, or radiological contrast. Once satisfactory needle placement was completed at the desired  level, radiological contrast was injected. Contrast injected under live fluoroscopy. No contrast complications. See chart for type and volume of contrast used. Fluoroscopic Guidance: I was personally present during the use of fluoroscopy. "Tunnel Vision Technique" used to obtain the best possible view of the target area. Parallax error corrected before commencing the procedure. "Direction-depth-direction" technique used to introduce the needle under continuous pulsed fluoroscopy. Once target was reached, antero-posterior, oblique, and lateral fluoroscopic projection used confirm needle placement in all planes. Images permanently stored in EMR.      Interpretation: I personally interpreted the imaging intraoperatively. Adequate needle placement confirmed in multiple planes. Appropriate spread of contrast into desired area was observed. No evidence of afferent or efferent intravascular uptake. Permanent images saved into the patient's record.  Antibiotic Prophylaxis:   Anti-infectives (From admission, onward)   None     Indication(s): None identified  Post-operative Assessment:  Post-procedure Vital Signs:  Pulse/HCG Rate: 89 (nsr)87 Temp: (!) 97.3 F (36.3 C) Resp: 18 BP: (!) 144/74 SpO2: 100 %  EBL:  None  Complications: No immediate post-treatment complications observed by team, or reported by patient.  Note: The patient tolerated the entire procedure well. A repeat set of vitals were taken after the procedure and the patient was kept under observation following institutional policy, for this type of procedure. Post-procedural neurological assessment was performed, showing return to baseline, prior to discharge. The patient was provided with post-procedure discharge instructions, including a section on how to identify potential problems. Should any problems arise concerning this procedure, the patient was given instructions to immediately contact us, at any time, without hesitation. In any case, we plan to contact the patient by telephone for a follow-up status report regarding this interventional procedure.  Comments:  No additional relevant information.  Plan of Care  Orders:  Orders Placed This Encounter  Procedures  . HIP INJECTION    Scheduling Instructions:     Side: Left-sided     Sedation: Patient's choice.     Timeframe: Today  . [facility] C-ARM 1-60 MIN NO REPORT    Intraoperative interpretation by procedural physician at [facility].    Standing Status:   Standing    Number of Occurrences:   1    Order Specific Question:   Reason for exam:    Answer:   Assistance in needle guidance and placement for procedures requiring needle placement in or near specific anatomical locations not easily accessible without such assistance.  . Ambulatory referral to Dentistry    Referral Priority:   Routine    Referral Type:   Consultation    Referral Reason:   Specialty Services Required    Requested Specialty:   Dental General Practice    Number of Visits Requested:   1  . Informed Consent Details: Physician/Practitioner Attestation; Transcribe to consent form and obtain patient signature    Nursing Order: Transcribe to consent form and obtain patient signature. Note:  Always confirm laterality of pain with Erika [name], before procedure. Procedure: Hip injection Indication/Reason: Hip Joint Pain (Arthralgia) Provider Attestation: I, Fountain Dossie Arbour, MD, (Pain Management Specialist), the physician/practitioner, attest that I have discussed with the patient the benefits, risks, side effects, alternatives, likelihood of achieving goals and potential problems during recovery for the procedure that I have provided informed consent.  . Care order/instruction: Please confirm that the patient has stopped the Plavix (Clopidogrel) x 7-10 days prior to procedure or surgery.    Please confirm that the patient has stopped the  Plavix (Clopidogrel) x 7-10 days prior to procedure or surgery.    Standing Status:   Standing    Number of Occurrences:   1  . Provide equipment / supplies at bedside    Equipment required: Single use, disposable, "Block Tray"    Standing Status:   Standing    Number of Occurrences:   1    Order Specific Question:   Specify    Answer:   Block Tray  . Bleeding precautions    Standing Status:   Standing    Number of Occurrences:   1   Chronic Opioid Analgesic:  Oxycodone IR 5 mg. 1 tab PO BID (10 mg/day of oxycodone) MME/day: 15 mg/day.   Medications ordered for procedure: Meds ordered this encounter  Medications  . iohexol (OMNIPAQUE) 180 MG/ML injection 10 mL    Must be Myelogram-compatible. If not available, you may substitute with a water-soluble, non-ionic, hypoallergenic, myelogram-compatible radiological contrast medium.  Marland Kitchen lidocaine (XYLOCAINE) 2 % (with pres) injection 400 mg  . lactated ringers infusion 1,000 mL  . midazolam (VERSED) 5 MG/5ML injection 1-2 mg    Make sure Flumazenil is available in the pyxis when using this medication. If oversedation occurs, administer 0.2 mg IV over 15 sec. If after 45 sec no response, administer 0.2 mg again over 1 min; may repeat at 1 min intervals; not to exceed 4 doses (1 mg)  . fentaNYL  (SUBLIMAZE) injection 25-50 mcg    Make sure Narcan is available in the pyxis when using this medication. In the event of respiratory depression (RR< 8/min): Titrate NARCAN (naloxone) in increments of 0.1 to 0.2 mg IV at 2-3 minute intervals, until desired degree of reversal.  . methylPREDNISolone acetate (DEPO-MEDROL) injection 80 mg  . ropivacaine (PF) 2 mg/mL (0.2%) (NAROPIN) injection 9 mL   Medications administered: We administered iohexol, lidocaine, lactated ringers, midazolam, fentaNYL, methylPREDNISolone acetate, and ropivacaine (PF) 2 mg/mL (0.2%).  See the medical record for exact dosing, route, and time of administration.  Follow-up plan:   Return in about 2 weeks (around 08/16/2020) for (20-min), (F2F), (PP) Follow-up.       Interventional management options: Considering:   NOTE: PLAVIX ANTICOAGULATION(Stop:7-10 days pre-procedure; Restart:2 hours post-proc.) Diagnostic right facial nerve block Diagnostic right geniculate nerve block  Diagnostic right occipital nerve block  Possible right occipital nerveRFA Diagnostic Botox injections   Palliative PRN treatment(s):   Therapeutic left peri-insertional gluteus medius tendon injection #2   Therapeutic left IA hip joint injection #2  Therapeutic/palliative Facial (CN VII) nerve block  #4  Therapeutic/palliative right facial/Geniculatenerve block #3    Recent Visits Date Type Provider Dept  07/25/20 Office Visit Milinda Pointer, MD [facility]  06/22/20 Telemedicine Milinda Pointer, MD [facility]  Showing recent visits within past 90 days and meeting all other requirements Today's Visits Date Type Provider Dept  08/02/20 Procedure visit Milinda Pointer, MD [facility]  Showing today's visits and meeting all other requirements Future Appointments Date Type Provider Dept  08/17/20 Appointment Milinda Pointer, MD [facility]  Showing future appointments within  next 90 days and meeting all other requirements  Disposition: Discharge home  Discharge (Date  Time): 08/02/2020; 1343 hrs.   Primary Care Physician: Libby Maw, MD Location: [facility] Note by: Gaspar Cola, MD Date: 08/02/2020; Time: 2:06 PM  Disclaimer:  Medicine is not an Chief Strategy Officer. The only guarantee in medicine is that nothing is guaranteed. It is important to  note that the decision to proceed with this intervention was based on the information collected from the patient. The Data and conclusions were drawn from the patient's questionnaire, the interview, and the physical examination. Because the information was provided in large part by the patient, it cannot be guaranteed that it has not been purposely or unconsciously manipulated. Every effort has been made to obtain as much relevant data as possible for this evaluation. It is important to note that the conclusions that lead to this procedure are derived in large part from the available data. Always take into account that the treatment will also be dependent on availability of resources and existing treatment guidelines, considered by other Pain Management Practitioners as being common knowledge and practice, at the time of the intervention. For Medico-Legal purposes, it is also important to point out that variation in procedural techniques and pharmacological choices are the acceptable norm. The indications, contraindications, technique, and results of the above procedure should only be interpreted and judged by a Board-Certified Interventional Pain Specialist with extensive familiarity and expertise in the same exact procedure and technique.

## 2020-08-03 ENCOUNTER — Telehealth: Payer: Self-pay | Admitting: *Deleted

## 2020-08-03 NOTE — Telephone Encounter (Signed)
Attempted to call for post procedure follow-up. No answer, unable to leave a message. 

## 2020-08-05 ENCOUNTER — Other Ambulatory Visit (HOSPITAL_COMMUNITY): Payer: Self-pay | Admitting: Psychiatry

## 2020-08-05 DIAGNOSIS — F4310 Post-traumatic stress disorder, unspecified: Secondary | ICD-10-CM

## 2020-08-09 ENCOUNTER — Other Ambulatory Visit: Payer: Self-pay

## 2020-08-09 ENCOUNTER — Ambulatory Visit (INDEPENDENT_AMBULATORY_CARE_PROVIDER_SITE_OTHER): Payer: Managed Care, Other (non HMO) | Admitting: Neurology

## 2020-08-09 ENCOUNTER — Encounter: Payer: Self-pay | Admitting: Neurology

## 2020-08-09 VITALS — BP 185/90 | HR 89 | Ht 67.0 in | Wt 187.0 lb

## 2020-08-09 DIAGNOSIS — R251 Tremor, unspecified: Secondary | ICD-10-CM | POA: Diagnosis not present

## 2020-08-09 DIAGNOSIS — G5702 Lesion of sciatic nerve, left lower limb: Secondary | ICD-10-CM

## 2020-08-09 DIAGNOSIS — R200 Anesthesia of skin: Secondary | ICD-10-CM

## 2020-08-09 DIAGNOSIS — I6523 Occlusion and stenosis of bilateral carotid arteries: Secondary | ICD-10-CM

## 2020-08-09 DIAGNOSIS — G5631 Lesion of radial nerve, right upper limb: Secondary | ICD-10-CM

## 2020-08-09 DIAGNOSIS — M21372 Foot drop, left foot: Secondary | ICD-10-CM

## 2020-08-09 NOTE — Progress Notes (Addendum)
[facility]  PATIENT: Erika [name] DOB: 1971-07-25  REFERRING DOCTOR OR PCP: Abelino Derrick MD SOURCE: Patient, notes from primary care  _________________________________   HISTORICAL  CHIEF COMPLAINT:  Chief Complaint  Patient presents with  . New Patient (Initial Visit)    RM 12, alone. Internal referral from Abelino Derrick, MD for left peroneal nerve palsy. Has some drop foot in left foot. Started about two months ago. Tried to do some rehab at home. Helped some but stlil not back to baseline.    HISTORY OF PRESENT ILLNESS:  I had the pleasure of seeing patient, Erika [name], at [facility] for neurologic consultation regarding her left peroneal nerve palsy.  She is a 49 year old woman who woke up with left foot/ankle weakness and mild numbness in the foot.   She does not recall anything unusual about the preceding day/night.   Even before the onset, she often sat with the left leg over the right knee and will sometimes shift due to numbness.   She has not had this issue in the past.        She had a radial nerve palsy in her arm about a year.  This happened a day after shooting 100 rounds.   She felt symptoms resolved over the next two weeks.     Her great-aunt had drop foot.   Her birth mother has numbness in her legs and poor gait.    She denies lower back pain.   No bladder changes   She has carotid stenosis and is s/p right CEA x 2 and PVD in her legs  REVIEW OF SYSTEMS: Constitutional: No fevers, chills, sweats, or change in appetite Eyes: No visual changes, double vision, eye pain Ear, nose and throat: No hearing loss, ear pain, nasal congestion, sore throat Cardiovascular: No chest pain, palpitations Respiratory: No shortness of breath at rest or with exertion.   No wheezes GastrointestinaI: No nausea, vomiting, diarrhea, abdominal pain, fecal incontinence Genitourinary: No dysuria, urinary retention or  frequency.  No nocturia. Musculoskeletal: No neck pain, back pain Integumentary: No rash, pruritus, skin lesions Neurological: as above Psychiatric: Has PTSD on thorazine.   Endocrine: No palpitations, diaphoresis, change in appetite, change in weigh or increased thirst Hematologic/Lymphatic: No anemia, purpura, petechiae. Allergic/Immunologic: No itchy/runny eyes, nasal congestion, recent allergic reactions, rashes  ALLERGIES: Allergies  Allergen Reactions  . Chantix [Varenicline Tartrate] Other (See Comments)    Feels "crazy" and angry     HOME MEDICATIONS:  Current Outpatient Medications:  .  aspirin EC 81 MG tablet, Take 1 tablet (81 mg total) by mouth daily., Disp: 90 tablet, Rfl: 3 .  benztropine (COGENTIN) 0.5 MG tablet, Take 1 tablet (0.5 mg total) by mouth at bedtime., Disp: 90 tablet, Rfl: 0 .  carvedilol (COREG) 25 MG tablet, Take 1 tablet (25 mg total) by mouth 2 (two) times daily with a meal., Disp: 60 tablet, Rfl: 11 .  chlorproMAZINE (THORAZINE) 100 MG tablet, Take 1 tablet (100 mg total) by mouth at bedtime., Disp: 30 tablet, Rfl: 0 .  clonazePAM (KLONOPIN) 0.5 MG tablet, Take 1 tablet (0.5 mg total) by mouth 3 (three) times daily as needed for anxiety., Disp: 90 tablet, Rfl: 2 .  clopidogrel (PLAVIX) 75 MG tablet, TAKE 1 TABLET BY MOUTH EVERY DAY, Disp: 30 tablet, Rfl: 3 .  dapagliflozin propanediol (FARXIGA) 5 MG TABS tablet, Take 5 mg by mouth daily before breakfast., Disp: 30 tablet, Rfl: 11 .  Evolocumab (REPATHA SURECLICK) 536  MG/ML SOAJ, Inject 1 pen into the skin every 14 (fourteen) days., Disp: 2 pen, Rfl: 11 .  FLUoxetine HCl 60 MG TABS, Take 60 mg by mouth daily., Disp: 90 tablet, Rfl: 0 .  insulin glargine, 2 Unit Dial, (TOUJEO MAX SOLOSTAR) 300 UNIT/ML Solostar Pen, Inject 110 Units into the skin every morning. (Patient taking differently: Inject 110 Units into the skin in the morning. ), Disp: 4 pen, Rfl: 11 .  insulin glargine, 2 Unit Dial, (TOUJEO MAX  SOLOSTAR) 300 UNIT/ML Solostar Pen, Inject 110 Units into the skin every morning. , Disp: , Rfl:  .  insulin lispro (HUMALOG KWIKPEN) 100 UNIT/ML KwikPen, Inject 0.03 mLs (3 Units total) into the skin 3 (three) times daily with meals. Take only if blood sugar is over 250.  Also pen needles 4/day, Disp: 15 mL, Rfl: 11 .  Insulin Syringe-Needle U-100 29G X 1/2" 0.3 ML MISC, 1 each by Does not apply route 2 (two) times daily., Disp: 30 each, Rfl: 0 .  lamoTRIgine (LAMICTAL) 200 MG tablet, Take 1 tablet (200 mg total) by mouth daily., Disp: 90 tablet, Rfl: 0 .  losartan (COZAAR) 100 MG tablet, Take 1 tablet (100 mg total) by mouth daily., Disp: 30 tablet, Rfl: 11 .  magic mouthwash SOLN, Take 15 mLs by mouth 3 (three) times daily as needed for mouth pain., Disp: 250 mL, Rfl: 1 .  naloxone (NARCAN) nasal spray 4 mg/0.1 mL, 4 mg (contents of 1 nasal spray) as a single dose in one nostril; may repeat every 2 to 3 minutes in alternating nostrils until medical assistance becomes available, Disp: 1 kit, Rfl: 0 .  nitroGLYCERIN (NITROSTAT) 0.4 MG SL tablet, Place 1 tablet (0.4 mg total) under the tongue every 5 (five) minutes as needed for chest pain., Disp: 25 tablet, Rfl: 98 .  ondansetron (ZOFRAN) 4 MG tablet, Take 1 tablet (4 mg total) by mouth daily as needed., Disp: 20 tablet, Rfl: 11 .  oxyCODONE (OXY IR/ROXICODONE) 5 MG immediate release tablet, Take 1 tablet (5 mg total) by mouth 2 (two) times daily as needed for severe pain. Must last 30 days., Disp: 60 tablet, Rfl: 0 .  [START ON 09/06/2020] oxyCODONE (OXY IR/ROXICODONE) 5 MG immediate release tablet, Take 1 tablet (5 mg total) by mouth 2 (two) times daily as needed for severe pain. Must last 30 days., Disp: 60 tablet, Rfl: 0 .  [START ON 10/06/2020] oxyCODONE (OXY IR/ROXICODONE) 5 MG immediate release tablet, Take 1 tablet (5 mg total) by mouth 2 (two) times daily as needed for severe pain. Must last 30 days., Disp: 60 tablet, Rfl: 0 .  pregabalin  (LYRICA) 225 MG capsule, Take 1 capsule (225 mg total) by mouth 3 (three) times daily., Disp: 90 capsule, Rfl: 2 .  spironolactone (ALDACTONE) 25 MG tablet, Take 1 tablet (25 mg total) by mouth daily., Disp: 90 tablet, Rfl: 3 .  albuterol (PROVENTIL HFA;VENTOLIN HFA) 108 (90 Base) MCG/ACT inhaler, Inhale 2 puffs into the lungs every 6 (six) hours as needed for wheezing., Disp: 1 Inhaler, Rfl: 0 .  amLODipine (NORVASC) 10 MG tablet, Take 1 tablet (10 mg total) by mouth daily., Disp: 90 tablet, Rfl: 3 .  isosorbide mononitrate (IMDUR) 60 MG 24 hr tablet, Take 1 tablet (60 mg total) by mouth daily., Disp: 90 tablet, Rfl: 1  PAST MEDICAL HISTORY: Past Medical History:  Diagnosis Date  . Arthralgia of temporomandibular joint   . CAD, multiple vessel    a. cath 06/29/16: ostLM 40%,  ostLAD 40%, pLAD 95%, ost-pLCx 60%, pLCx 95%, mLCx 60%, mRCA 95%, D2 50%, LVSF nl;  b. 07/2016 CABG x 4 (LIMA->LAD, VG->Diag, VG->OM, VG->RCA); c. 08/2016 Cath: 3VD w/ 4/4 patent grafts. LAD distal to LIMA has diff dzs->Med rx.  . Carotid arterial disease ([place])    a. 07/2016 s/p R CEA.  . Clotting disorder ([place])   . Depression   . Diastolic dysfunction    a. echo 06/28/16: EF 50-55%, mild inf wall HK, GR1DD, mild MR, RV sys fxn nl, mildly dilated LA, PASP nl  . Fatty liver disease, nonalcoholic 4888  . History of blood transfusion    with heart surgery  . HLD (hyperlipidemia)   . Labile hypertension    a. prior renal ngiogram negative for RAS in 03/2016; b. catecholamines and metanephrines normal, mildly elevated renin with normal aldosterone and normal ratio in 02/2016  . Myocardial infarction ([place]) 2017  . Obesity   . PAD (peripheral artery disease) ([facility])    in both legs  . PTSD (post-traumatic stress disorder)   . Tobacco abuse    In 2018 - pt cut back from 2 ppd to 0.5 ppd.- still 1/2 ppd as of 04/22/20  . Type 2 diabetes mellitus [facility]) January 2017    PAST SURGICAL HISTORY: Past Surgical History:  Procedure  Laterality Date  . ABDOMINAL AORTOGRAM W/LOWER EXTREMITY [name] 10/15/2018   Procedure: ABDOMINAL AORTOGRAM W/LOWER EXTREMITY;  Surgeon: Wellington Hampshire, MD;  Location: [place] [facility];  Service: Cardiovascular;  Laterality: [name];  . ABDOMINAL AORTOGRAM W/LOWER EXTREMITY Bilateral 08/19/2019   Procedure: ABDOMINAL AORTOGRAM W/LOWER EXTREMITY;  Surgeon: Wellington Hampshire, MD;  Location: [place] [facility];  Service: Cardiovascular;  Laterality: Bilateral;  . CARDIAC CATHETERIZATION [name] 06/29/2016   Procedure: Left Heart Cath and Coronary Angiography;  Surgeon: Minna Merritts, MD;  Location: [place] [facility];  Service: Cardiovascular;  Laterality: [name];  . CARDIAC CATHETERIZATION [name] 08/29/2016   Procedure: Left Heart Cath and Cors/Grafts Angiography;  Surgeon: Wellington Hampshire, MD;  Location: [place] [facility];  Service: Cardiovascular;  Laterality: [name];  . CESAREAN SECTION    . CHOLECYSTECTOMY    . CORONARY ARTERY BYPASS GRAFT [name] 07/06/2016   Procedure: CORONARY ARTERY BYPASS GRAFTING (CABG) x four, using left internal mammary artery and right leg greater saphenous vein harvested endoscopically;  Surgeon: Ivin Poot, MD;  Location: [place];  Service: Open Heart Surgery;  Laterality: [name];  . ENDARTERECTOMY Right 07/06/2016   Procedure: ENDARTERECTOMY CAROTID;  Surgeon: Rosetta Posner, MD;  Location: [place];  Service: Vascular;  Laterality: Right;  . ENDARTERECTOMY Right 04/27/2020   Procedure: REDO OF RIGHT ENDARTERECTOMY CAROTID;  Surgeon: Rosetta Posner, MD;  Location: [place];  Service: Vascular;  Laterality: Right;  . PERIPHERAL VASCULAR CATHETERIZATION [name] 04/18/2016   Procedure: Renal Angiography;  Surgeon: Wellington Hampshire, MD;  Location: [place] [facility];  Service: Cardiovascular;  Laterality: [name];  . PERIPHERAL VASCULAR INTERVENTION Left 10/15/2018   Procedure: PERIPHERAL VASCULAR INTERVENTION;  Surgeon: Wellington Hampshire, MD;  Location: [place] [facility];  Service: Cardiovascular;   Laterality: Left;  Left superficial femoral  . TEE WITHOUT CARDIOVERSION [name] 07/06/2016   Procedure: TRANSESOPHAGEAL ECHOCARDIOGRAM (TEE);  Surgeon: Ivin Poot, MD;  Location: [place];  Service: Open Heart Surgery;  Laterality: [name];  . TONSILLECTOMY      FAMILY HISTORY: Family History  Adopted: Yes  Problem Relation Age of Onset  . Diabetes Mother   .  Diabetes Father   . Alcohol abuse Father   . Heart disease Father   . Drug abuse Father   . Stroke Sister   . Anxiety disorder Sister     SOCIAL HISTORY:  Social History   Socioeconomic History  . Marital status: Married    Spouse name: Not on file  . Number of children: 1  . Years of education: 50  . Highest education level: Not on file  Occupational History  . Occupation: Disability  Tobacco Use  . Smoking status: Current Every Day Smoker    Packs/day: 0.25    Years: 27.00    Pack years: 6.75    Types: Cigarettes  . Smokeless tobacco: Never Used  Vaping Use  . Vaping Use: Former  Substance and Sexual Activity  . Alcohol use: Not Currently    Alcohol/week: 0.0 standard drinks    Comment: socially  . Drug use: No  . Sexual activity: Yes    Partners: Male    Birth control/protection: None  Other Topics Concern  . Not on file  Social History Narrative   Lives with husband   Caffeine use: Drinks 2 cups coffee per day   Right handed   Social Determinants of Health   Financial Resource Strain:   . Difficulty of Paying Living Expenses:   Food Insecurity:   . Worried About Charity fundraiser in the Last Year:   . Arboriculturist in the Last Year:   Transportation Needs:   . Film/video editor (Medical):   Marland Kitchen Lack of Transportation (Non-Medical):   Physical Activity:   . Days of Exercise per Week:   . Minutes of Exercise per Session:   Stress:   . Feeling of Stress :   Social Connections:   . Frequency of Communication with Friends and Family:   . Frequency of Social Gatherings with Friends and  Family:   . Attends Religious Services:   . Active Member of Clubs or Organizations:   . Attends Archivist Meetings:   Marland Kitchen Marital Status:   Intimate Partner Violence:   . Fear of Current or Ex-Partner:   . Emotionally Abused:   Marland Kitchen Physically Abused:   . Sexually Abused:      PHYSICAL EXAM  Vitals:   08/09/20 1358  BP: (!) 185/90  Pulse: 89  Weight: 187 lb (84.8 kg)  Height: _0  (1.702 m)    Body mass index is 29.29 kg/m.   General: The patient is well-developed and well-nourished and in no acute distress  HEENT:  Head is [place]/AT.  Sclera are anicteric.    Neck: Right carotid bruit.  She has surgical scar on the right .  The neck is nontender.  Cardiovascular: The heart has a regular rate and rhythm with a normal S1 and S2. There were no murmurs, gallops or rubs.    Skin: Extremities are without rash or edema.  Musculoskeletal:  Back is nontender  Neurologic Exam  Mental status: The patient is alert and oriented x 3 at the time of the examination. The patient has apparent normal recent and remote memory, with an apparently normal attention span and concentration ability.   Speech is normal.  Cranial nerves: Extraocular movements are full.  Facial strength is normal.  She has old mild left ptosis. No obvious hearing deficits are noted.  Motor:  Muscle bulk is normal.   Tone is normal. Strength in the left leg is  2/5 in the peroneus longus  1/5 in the tibialis anterior 0/5 EHL and EDB.  Strength was normal elsewhere.  Sensory: Sensory testing is intact to pinprick, soft touch and vibration sensation in arms and right leg and reduced peroneal distribution sensation in the left foot  Coordination: Cerebellar testing reveals good finger-nose-finger and heel-to-shin bilaterally.  Gait and station: Station is normal.   He has a steppage gait on the left. Romberg is negative.   Reflexes: Deep tendon reflexes are symmetric and normal bilaterally.   Plantar  responses are flexor.    DIAGNOSTIC DATA (LABS, IMAGING, TESTING) - I reviewed patient records, labs, notes, testing and imaging myself where available.  Lab Results  Component Value Date   WBC 15.6 (H) 04/28/2020   HGB 13.8 04/28/2020   HCT 42.7 04/28/2020   MCV 89.0 04/28/2020   PLT 370 04/28/2020      Component Value Date/Time   NA 135 04/28/2020 0541   NA 133 (L) 08/13/2019 1427   NA 135 (L) 01/11/2015 1022   K 4.1 04/28/2020 0541   K 3.9 01/11/2015 1022   CL 97 (L) 04/28/2020 0541   CL 100 01/11/2015 1022   CO2 27 04/28/2020 0541   CO2 27 01/11/2015 1022   GLUCOSE 168 (H) 04/28/2020 0541   GLUCOSE 183 (H) 01/11/2015 1022   BUN 9 04/28/2020 0541   BUN 8 08/13/2019 1427   BUN 6 (L) 01/11/2015 1022   CREATININE 0.64 04/28/2020 0541   CREATININE 0.77 01/11/2015 1022   CALCIUM 9.9 04/28/2020 0541   CALCIUM 10.0 01/11/2015 1022   PROT 7.3 04/22/2020 0913   PROT 7.8 01/07/2019 1148   PROT 7.8 01/11/2015 1022   ALBUMIN 4.1 04/22/2020 0913   ALBUMIN 4.6 01/07/2019 1148   ALBUMIN 3.4 01/11/2015 1022   AST 19 04/22/2020 0913   AST 70 (H) 01/11/2015 1022   ALT 23 04/22/2020 0913   ALT 85 (H) 01/11/2015 1022   ALKPHOS 81 04/22/2020 0913   ALKPHOS 91 01/11/2015 1022   BILITOT 0.5 04/22/2020 0913   BILITOT 0.3 01/07/2019 1148   BILITOT 0.4 01/11/2015 1022   GFRNONAA >60 04/28/2020 0541   GFRNONAA >60 01/11/2015 1022   GFRNONAA 41 (L) 08/10/2014 1459   GFRAA >60 04/28/2020 0541   GFRAA >60 01/11/2015 1022   GFRAA 48 (L) 08/10/2014 1459   Lab Results  Component Value Date   CHOL 157 09/07/2019   HDL 38.70 (L) 09/07/2019   LDLCALC 80 09/07/2019   LDLDIRECT 116.0 06/29/2019   TRIG 191.0 (H) 09/07/2019   CHOLHDL 4 09/07/2019   Lab Results  Component Value Date   HGBA1C 9.5 (A) 04/11/2020   Lab Results  Component Value Date   VITAMINB12 317 06/09/2020   Lab Results  Component Value Date   TSH 1.693 09/15/2019       ASSESSMENT AND PLAN  Common  peroneal neuropathy, left - Plan: NCV with EMG(electromyography)  Tremor  Radial nerve palsy, right  Numbness - Plan: NCV with EMG(electromyography)  Left foot drop   In summary, Ms. Bulluck is a 49 year old woman with left foot/ankle weakness and numbness consistent with a common peroneal neuropathy.  She has a small amount of strength in the peroneus longus muscle but no strength in the other peroneal innervated muscles.  I discussed with her that most people with peroneal neuropathies will have a reasonably good recovery.  Muscles generally come back from the upper lower leg to the ankle to the foot.  Although probably unrelated, she had a radial nerve palsy  last year.  We will check a nerve conduction/EMG study to further evaluate her peroneal neuropathy and also to determine if she has evidence of a demyelinating neuropathy that could be hereditary neuropathy with pressure palsies, a genetic disorder.      I will see her when she returns for the NCV study.  She should call sooner if she has any significant new or worsening neurologic symptoms.  She will continue to to do therapy type exercises for her foot.  Thank you for asking me to see Ms. Lessley.  Please let me know if I can be of further assistance with her or other patients in the future.   Sharmarke Cicio A. Felecia Shelling, MD, [facility] 9/47/0962, 8:36 PM Certified in Neurology, Clinical Neurophysiology, Sleep Medicine and Neuroimaging  [facility] [address] [place], [place] [address] [phone]

## 2020-08-11 ENCOUNTER — Other Ambulatory Visit (HOSPITAL_COMMUNITY): Payer: Self-pay | Admitting: Psychiatry

## 2020-08-11 DIAGNOSIS — F4310 Post-traumatic stress disorder, unspecified: Secondary | ICD-10-CM

## 2020-08-12 ENCOUNTER — Other Ambulatory Visit: Payer: Self-pay | Admitting: Family

## 2020-08-12 ENCOUNTER — Ambulatory Visit: Payer: Managed Care, Other (non HMO) | Admitting: Nurse Practitioner

## 2020-08-17 ENCOUNTER — Ambulatory Visit: Payer: Managed Care, Other (non HMO) | Attending: Pain Medicine | Admitting: Pain Medicine

## 2020-08-17 ENCOUNTER — Telehealth: Payer: Self-pay | Admitting: *Deleted

## 2020-08-17 ENCOUNTER — Other Ambulatory Visit (HOSPITAL_COMMUNITY): Payer: Self-pay | Admitting: Psychiatry

## 2020-08-17 ENCOUNTER — Emergency Department: Payer: Managed Care, Other (non HMO)

## 2020-08-17 ENCOUNTER — Emergency Department
Admission: EM | Admit: 2020-08-17 | Discharge: 2020-08-17 | Disposition: A | Discharge status: Home / Self Care | Payer: Managed Care, Other (non HMO) | Attending: Emergency Medicine | Admitting: Emergency Medicine

## 2020-08-17 ENCOUNTER — Encounter (HOSPITAL_COMMUNITY): Payer: Self-pay | Admitting: Psychiatry

## 2020-08-17 ENCOUNTER — Other Ambulatory Visit: Payer: Self-pay

## 2020-08-17 ENCOUNTER — Encounter: Payer: Self-pay | Admitting: Pain Medicine

## 2020-08-17 ENCOUNTER — Telehealth (INDEPENDENT_AMBULATORY_CARE_PROVIDER_SITE_OTHER): Payer: 59 | Admitting: Psychiatry

## 2020-08-17 VITALS — BP 187/124 | HR 103 | Temp 97.8°F | Resp 16 | Ht 67.0 in | Wt 186.0 lb

## 2020-08-17 DIAGNOSIS — G894 Chronic pain syndrome: Secondary | ICD-10-CM | POA: Insufficient documentation

## 2020-08-17 DIAGNOSIS — B0221 Postherpetic geniculate ganglionitis: Secondary | ICD-10-CM | POA: Diagnosis present

## 2020-08-17 DIAGNOSIS — Z5321 Procedure and treatment not carried out due to patient leaving prior to being seen by health care provider: Secondary | ICD-10-CM | POA: Insufficient documentation

## 2020-08-17 DIAGNOSIS — F331 Major depressive disorder, recurrent, moderate: Secondary | ICD-10-CM

## 2020-08-17 DIAGNOSIS — M549 Dorsalgia, unspecified: Secondary | ICD-10-CM | POA: Diagnosis present

## 2020-08-17 DIAGNOSIS — M7602 Gluteal tendinitis, left hip: Secondary | ICD-10-CM

## 2020-08-17 DIAGNOSIS — M25552 Pain in left hip: Secondary | ICD-10-CM | POA: Diagnosis present

## 2020-08-17 DIAGNOSIS — R6884 Jaw pain: Secondary | ICD-10-CM | POA: Diagnosis present

## 2020-08-17 DIAGNOSIS — R519 Headache, unspecified: Secondary | ICD-10-CM | POA: Diagnosis present

## 2020-08-17 DIAGNOSIS — G8929 Other chronic pain: Secondary | ICD-10-CM | POA: Insufficient documentation

## 2020-08-17 DIAGNOSIS — F4310 Post-traumatic stress disorder, unspecified: Secondary | ICD-10-CM | POA: Diagnosis not present

## 2020-08-17 DIAGNOSIS — F410 Panic disorder [episodic paroxysmal anxiety] without agoraphobia: Secondary | ICD-10-CM

## 2020-08-17 LAB — CBC
HCT: 47.1 % — ABNORMAL HIGH (ref 36.0–46.0)
Hemoglobin: 16 g/dL — ABNORMAL HIGH (ref 12.0–15.0)
MCH: 29.1 pg (ref 26.0–34.0)
MCHC: 34 g/dL (ref 30.0–36.0)
MCV: 85.8 fL (ref 80.0–100.0)
Platelets: 381 10*3/uL (ref 150–400)
RBC: 5.49 MIL/uL — ABNORMAL HIGH (ref 3.87–5.11)
RDW: 14.8 % (ref 11.5–15.5)
WBC: 16 10*3/uL — ABNORMAL HIGH (ref 4.0–10.5)
nRBC: 0 % (ref 0.0–0.2)

## 2020-08-17 LAB — BASIC METABOLIC PANEL
Anion gap: 14 (ref 5–15)
BUN: 13 mg/dL (ref 6–20)
CO2: 25 mmol/L (ref 22–32)
Calcium: 10.2 mg/dL (ref 8.9–10.3)
Chloride: 93 mmol/L — ABNORMAL LOW (ref 98–111)
Creatinine, Ser: 0.92 mg/dL (ref 0.44–1.00)
GFR calc Af Amer: >60 mL/min (ref >60)
GFR calc non Af Amer: >60 mL/min (ref >60)
Glucose, Bld: 501 mg/dL (ref 70–99)
Potassium: 3.7 mmol/L (ref 3.5–5.1)
Sodium: 132 mmol/L — ABNORMAL LOW (ref 135–145)

## 2020-08-17 LAB — TROPONIN I (HIGH SENSITIVITY): Troponin I (High Sensitivity): 19 ng/L — ABNORMAL HIGH (ref <18)

## 2020-08-17 MED ORDER — SODIUM CHLORIDE 0.9 % IV BOLUS: 1000.0000 mL | Freq: Once | INTRAVENOUS | Status: DC

## 2020-08-17 MED ORDER — BENZTROPINE MESYLATE 0.5 MG PO TABS: 0.5000 mg | ORAL_TABLET | Freq: Every day | ORAL | 0 refills | Status: DC

## 2020-08-17 MED ORDER — CLONAZEPAM 0.5 MG PO TABS: 0.5000 mg | ORAL_TABLET | Freq: Three times a day (TID) | ORAL | 2 refills | Status: DC | PRN

## 2020-08-17 MED ORDER — LAMOTRIGINE 200 MG PO TABS: 200.0000 mg | ORAL_TABLET | Freq: Every day | ORAL | 0 refills | Status: DC

## 2020-08-17 MED ORDER — HYDROCODONE-ACETAMINOPHEN 5-325 MG PO TABS: 1.0000 | ORAL_TABLET | Freq: Three times a day (TID) | ORAL | 0 refills | Status: DC | PRN

## 2020-08-17 MED ORDER — FLUOXETINE HCL 60 MG PO TABS: 60.0000 mg | ORAL_TABLET | Freq: Every day | ORAL | 0 refills | Status: DC

## 2020-08-17 MED ORDER — CHLORPROMAZINE HCL 100 MG PO TABS: 100.0000 mg | ORAL_TABLET | Freq: Every day | ORAL | 0 refills | Status: DC

## 2020-08-17 NOTE — ED Notes (Signed)
Per EMS report, patient c/o left shoulder pain for 2 hours. Patient took a Nitro SL at home without relief. B/P 144/87, 134, pulse, 99% on room air. History of CABG.

## 2020-08-17 NOTE — Telephone Encounter (Signed)
Called pharmacy, cancelled 2 scripts for Oxycodone that are on hold.

## 2020-08-17 NOTE — Progress Notes (Signed)
Safety precautions to be maintained throughout the outpatient stay will include: orient to surroundings, keep bed in low position, maintain call bell within reach at all times, provide assistance with transfer out of bed and ambulation.  

## 2020-08-17 NOTE — ED Triage Notes (Signed)
Pt here with back pain that was similar to her last MI and came in to be checked out. Pt in NAD in triage.
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
## 2020-08-17 NOTE — Patient Instructions (Addendum)
____________________________________________________________________________________________  Drug Holidays (Slow)  What is a "Drug Holiday"? Drug Holiday: is the name given to the period of time during which a patient stops taking a medication(s) for the purpose of eliminating tolerance to the drug.  Benefits . Improved effectiveness of opioids. . Decreased opioid dose needed to achieve benefits. . Improved pain with lesser dose.  What is tolerance? Tolerance: is the progressive decreased in effectiveness of a drug due to its repetitive use. With repetitive use, the body gets use to the medication and as a consequence, it loses its effectiveness. This is a common problem seen with opioid pain medications. As a result, a larger dose of the drug is needed to achieve the same effect that used to be obtained with a smaller dose.  How long should a "Drug Holiday" last? You should stay off of the pain medicine for at least 14 consecutive days. (2 weeks)  Should I stop the medicine "cold turkey"? No. You should always coordinate with your Pain Specialist so that he/she can provide you with the correct medication dose to make the transition as smoothly as possible.  How do I stop the medicine? Slowly. You will be instructed to decrease the daily amount of pills that you take by one (1) pill every seven (7) days. This is called a "slow downward taper" of your dose. For example: if you normally take four (4) pills per day, you will be asked to drop this dose to three (3) pills per day for seven (7) days, then to two (2) pills per day for seven (7) days, then to one (1) per day for seven (7) days, and at the end of those last seven (7) days, this is when the "Drug Holiday" would start.   Will I have withdrawals? By doing a "slow downward taper" like this one, it is unlikely that you will experience any significant withdrawal symptoms. Typically, what triggers withdrawals is the sudden stop of a high  dose opioid therapy. Withdrawals can usually be avoided by slowly decreasing the dose over a prolonged period of time. If you do not follow these instructions and decide to stop your medication abruptly, withdrawals may be possible.  What are withdrawals? Withdrawals: refers to the wide range of symptoms that occur after stopping or dramatically reducing opiate drugs after heavy and prolonged use. Withdrawal symptoms do not occur to patients that use low dose opioids, or those who take the medication sporadically. Contrary to benzodiazepine (example: Valium, Xanax, etc.) or alcohol withdrawals ("Delirium Tremens"), opioid withdrawals are not lethal. Withdrawals are the physical manifestation of the body getting rid of the excess receptors.  Expected Symptoms Early symptoms of withdrawal may include: . Agitation . Anxiety . Muscle aches . Increased tearing . Insomnia . Runny nose . Sweating . Yawning  Late symptoms of withdrawal may include: . Abdominal cramping . Diarrhea . Dilated pupils . Goose bumps . Nausea . Vomiting  Will I experience withdrawals? Due to the slow nature of the taper, it is very unlikely that you will experience any.  What is a slow taper? Taper: refers to the gradual decrease in dose.  (Last update: 07/13/2020) ____________________________________________________________________________________________    ____________________________________________________________________________________________  Medication Rules  Purpose: To inform patients, and their family members, of our rules and regulations.  Applies to: All patients receiving prescriptions (written or electronic).  Pharmacy of record: Pharmacy where electronic prescriptions will be sent. If written prescriptions are taken to a different pharmacy, please inform the nursing staff. The pharmacy   listed in the electronic medical record should be the one where you would like electronic prescriptions  to be sent.  Electronic prescriptions: In compliance with the [place] Strengthen Opioid Misuse Prevention (STOP) Act of 2017 (Session Law 2017-74/H243), effective December 24, 2018, all controlled substances must be electronically prescribed. Calling prescriptions to the pharmacy will cease to exist.  Prescription refills: Only during scheduled appointments. Applies to all prescriptions.  NOTE: The following applies primarily to controlled substances (Opioid* Pain Medications).   Type of encounter (visit): For patients receiving controlled substances, face-to-face visits are required. (Not an option or up to the patient.)  Patient's responsibilities: 1. Pain Pills: Bring all pain pills to every appointment (except for procedure appointments). 2. Pill Bottles: Bring pills in original pharmacy bottle. Always bring the newest bottle. Bring bottle, even if empty. 3. Medication refills: You are responsible for knowing and keeping track of what medications you take and those you need refilled. The day before your appointment: write a list of all prescriptions that need to be refilled. The day of the appointment: give the list to the admitting nurse. Prescriptions will be written only during appointments. No prescriptions will be written on procedure days. If you forget a medication: it will not be "Called in", "Faxed", or "electronically sent". You will need to get another appointment to get these prescribed. No early refills. Do not call asking to have your prescription filled early. 4. Prescription Accuracy: You are responsible for carefully inspecting your prescriptions before leaving our office. Have the discharge nurse carefully go over each prescription with you, before taking them home. Make sure that your name is accurately spelled, that your address is correct. Check the name and dose of your medication to make sure it is accurate. Check the number of pills, and the written instructions to  make sure they are clear and accurate. Make sure that you are given enough medication to last until your next medication refill appointment. 5. Taking Medication: Take medication as prescribed. When it comes to controlled substances, taking less pills or less frequently than prescribed is permitted and encouraged. Never take more pills than instructed. Never take medication more frequently than prescribed.  6. Inform other Doctors: Always inform, all of your healthcare providers, of all the medications you take. 7. Pain Medication from other Providers: You are not allowed to accept any additional pain medication from any other Doctor or Healthcare provider. There are two exceptions to this rule. (see below) In the event that you require additional pain medication, you are responsible for notifying us, as stated below. 8. Medication Agreement: You are responsible for carefully reading and following our Medication Agreement. This must be signed before receiving any prescriptions from our practice. Safely store a copy of your signed Agreement. Violations to the Agreement will result in no further prescriptions. (Additional copies of our Medication Agreement are available upon request.) 9. Laws, Rules, & Regulations: All patients are expected to follow all Federal and State Laws, Statutes, Rules, & Regulations. Ignorance of the Laws does not constitute a valid excuse.  10. Illegal drugs and Controlled Substances: The use of illegal substances (including, but not limited to marijuana and its derivatives) and/or the illegal use of any controlled substances is strictly prohibited. Violation of this rule may result in the immediate and permanent discontinuation of any and all prescriptions being written by our practice. The use of any illegal substances is prohibited. 11. Adopted CDC guidelines & recommendations: Target dosing levels will be at or   below 60 MME/day. Use of benzodiazepines** is not  recommended.  Exceptions: There are only two exceptions to the rule of not receiving pain medications from other Healthcare Providers. 1. Exception #1 (Emergencies): In the event of an emergency (i.e.: accident requiring emergency care), you are allowed to receive additional pain medication. However, you are responsible for: As soon as you are able, call our office [phone], at any time of the day or night, and leave a message stating your name, the date and nature of the emergency, and the name and dose of the medication prescribed. In the event that your call is answered by a member of our staff, make sure to document and save the date, time, and the name of the person that took your information.  2. Exception #2 (Planned Surgery): In the event that you are scheduled by another doctor or dentist to have any type of surgery or procedure, you are allowed (for a period no longer than 30 days), to receive additional pain medication, for the acute post-op pain. However, in this case, you are responsible for picking up a copy of our "Post-op Pain Management for Surgeons" handout, and giving it to your surgeon or dentist. This document is available at our office, and does not require an appointment to obtain it. Simply go to our office during business hours (Monday-Thursday from 8:00 AM to 4:00 PM) (Friday 8:00 AM to 12:00 Noon) or if you have a scheduled appointment with us, prior to your surgery, and ask for it by name. In addition, you will need to provide us with your name, name of your surgeon, type of surgery, and date of procedure or surgery.  *Opioid medications include: morphine, codeine, oxycodone, oxymorphone, hydrocodone, hydromorphone, meperidine, tramadol, tapentadol, buprenorphine, fentanyl, methadone. **Benzodiazepine medications include: diazepam (Valium), alprazolam (Xanax), clonazepam (Klonopine), lorazepam (Ativan), clorazepate (Tranxene), chlordiazepoxide (Librium), estazolam (Prosom),  oxazepam (Serax), temazepam (Restoril), triazolam (Halcion) (Last updated: 02/20/2018) ____________________________________________________________________________________________   ____________________________________________________________________________________________  Medication Recommendations and Reminders  Applies to: All patients receiving prescriptions (written and/or electronic).  Medication Rules & Regulations: These rules and regulations exist for your safety and that of others. They are not flexible and neither are we. Dismissing or ignoring them will be considered "non-compliance" with medication therapy, resulting in complete and irreversible termination of such therapy. (See document titled "Medication Rules" for more details.) In all conscience, because of safety reasons, we cannot continue providing a therapy where the patient does not follow instructions.  Pharmacy of record:   Definition: This is the pharmacy where your electronic prescriptions will be sent.   We do not endorse any particular pharmacy, however, we have experienced problems with [facility] not securing enough medication supply for the community.  We do not restrict you in your choice of pharmacy. However, once we write for your prescriptions, we will NOT be re-sending more prescriptions to fix restricted supply problems created by your pharmacy, or your insurance.   The pharmacy listed in the electronic medical record should be the one where you want electronic prescriptions to be sent.  If you choose to change pharmacy, simply notify our nursing staff.  Recommendations:  Keep all of your pain medications in a safe place, under lock and key, even if you live alone. We will NOT replace lost, stolen, or damaged medication.  After you fill your prescription, take 1 week's worth of pills and put them away in a safe place. You should keep a separate, properly labeled bottle for this purpose. The remainder    should be kept in the original bottle. Use this as your primary supply, until it runs out. Once it's gone, then you know that you have 1 week's worth of medicine, and it is time to come in for a prescription refill. If you do this correctly, it is unlikely that you will ever run out of medicine.  To make sure that the above recommendation works, it is very important that you make sure your medication refill appointments are scheduled at least 1 week before you run out of medicine. To do this in an effective manner, make sure that you do not leave the office without scheduling your next medication management appointment. Always ask the nursing staff to show you in your prescription , when your medication will be running out. Then arrange for the receptionist to get you a return appointment, at least 7 days before you run out of medicine. Do not wait until you have 1 or 2 pills left, to come in. This is very poor planning and does not take into consideration that we may need to cancel appointments due to bad weather, sickness, or emergencies affecting our staff.  DO NOT ACCEPT A "Partial Fill": If for any reason your pharmacy does not have enough pills/tablets to completely fill or refill your prescription, do not allow for a "partial fill". The law allows the pharmacy to complete that prescription within 72 hours, without requiring a new prescription. If they do not fill the rest of your prescription within those 72 hours, you will need a separate prescription to fill the remaining amount, which we will NOT provide. If the reason for the partial fill is your insurance, you will need to talk to the pharmacist about payment alternatives for the remaining tablets, but again, DO NOT ACCEPT A PARTIAL FILL, unless you can trust your pharmacist to obtain the remainder of the pills within 72 hours.  Prescription refills and/or changes in medication(s):   Prescription refills, and/or changes in dose or medication,  will be conducted only during scheduled medication management appointments. (Applies to both, written and electronic prescriptions.)  No refills on procedure days. No medication will be changed or started on procedure days. No changes, adjustments, and/or refills will be conducted on a procedure day. Doing so will interfere with the diagnostic portion of the procedure.  No phone refills. No medications will be "called into the pharmacy".  No Fax refills.  No weekend refills.  No Holliday refills.  No after hours refills.  Remember:  Business hours are:  Monday to Thursday 8:00 AM to 4:00 PM Provider's Schedule: Nicolet Griffy, MD - Appointments are:  Medication management: Monday and Wednesday 8:00 AM to 4:00 PM Procedure day: Tuesday and Thursday 7:30 AM to 4:00 PM Bilal Lateef, MD - Appointments are:  Medication management: Tuesday and Thursday 8:00 AM to 4:00 PM Procedure day: Monday and Wednesday 7:30 AM to 4:00 PM (Last update: 07/13/2020) ____________________________________________________________________________________________   ____________________________________________________________________________________________  CBD (cannabidiol) WARNING  Applicable to: All individuals currently taking or considering taking CBD (cannabidiol) and, more important, all patients taking opioid analgesic controlled substances (pain medication). (Example: oxycodone; oxymorphone; hydrocodone; hydromorphone; morphine; methadone; tramadol; tapentadol; fentanyl; buprenorphine; butorphanol; dextromethorphan; meperidine; codeine; etc.)  Legal status: CBD remains a Schedule I drug prohibited for any use. CBD is illegal with one exception. In the [place], CBD has a limited Food and Drug Administration (FDA) approval for the treatment of two specific types of epilepsy disorders. Only one CBD product has been approved by the   FDA for this purpose: "Epidiolex". FDA is aware that some  companies are marketing products containing cannabis and cannabis-derived compounds in ways that violate the [employer], Drug and Cosmetic Act [facility] Act) and that may put the health and safety of consumers at risk. The FDA, a Federal agency, has not enforced the CBD status since 2018.   Legality: Some manufacturers ship CBD products nationally, which is illegal. Often such products are sold online and are therefore available throughout the country. CBD is openly sold in head shops and health food stores in some states where such sales have not been explicitly legalized. Selling unapproved products with unsubstantiated therapeutic claims is not only a violation of the law, but also can put patients at risk, as these products have not been proven to be safe or effective. Federal illegality makes it difficult to conduct research on CBD.  Reference: "FDA Regulation of Cannabis and Cannabis-Derived Products, Including Cannabidiol (CBD)" - [employer]  Warning: CBD is not FDA approved and has not undergo the same manufacturing controls as prescription drugs.  This means that the purity and safety of available CBD may be questionable. Most of the time, despite manufacturer's claims, it is contaminated with THC (delta-9-tetrahydrocannabinol - the chemical in marijuana responsible for the "HIGH").  When this is the case, the [facility] contaminant will trigger a positive urine drug screen (UDS) test for Marijuana (carboxy-THC). Because a positive UDS for any illicit substance is a violation of our medication agreement, your opioid analgesics (pain medicine) may be permanently discontinued.  MORE ABOUT CBD  General Information: CBD  is a derivative of the Marijuana (cannabis sativa) plant discovered in 46. It is one of the 113 identified substances found in Marijuana. It accounts for up to 40% of the  plant's extract. As of 2018, preliminary clinical studies on CBD included research for the treatment of anxiety, movement disorders, and pain. CBD is available and consumed in multiple forms, including inhalation of smoke or vapor, as an aerosol spray, and by mouth. It may be supplied as an oil containing CBD, capsules, dried cannabis, or as a liquid solution. CBD is thought not to be as psychoactive as THC (delta-9-tetrahydrocannabinol - the chemical in marijuana responsible for the "HIGH"). Studies suggest that CBD may interact with different biological target receptors in the body, including cannabinoid and other neurotransmitter receptors. As of 2018 the mechanism of action for its biological effects has not been determined.  Side-effects  Adverse reactions: Dry mouth, diarrhea, decreased appetite, fatigue, drowsiness, malaise, weakness, sleep disturbances, and others.  Drug interactions: CBC may interact with other medications such as blood-thinners. (Last update: 07/30/2020) ____________________________________________________________________________________________ Three prescriptions for Hydrocodone have been sent to your pharmacy.

## 2020-08-17 NOTE — ED Notes (Signed)
Patient left after being triaged because her pain was improved. Patient was contacted to tell her that her glucose was 501. Patient declined to come back and stated that she would take insulin at home and contact her PCP tomorrow.

## 2020-08-17 NOTE — Progress Notes (Signed)
PROVIDER NOTE: Information contained herein reflects review and annotations entered in association with encounter. Interpretation of such information and data should be left to medically-trained personnel. Information provided to patient can be located elsewhere in the medical record under "Patient Instructions". Document created using STT-dictation technology, any transcriptional errors that may result from process are unintentional.    Patient: Erika [name]  Service Category: E/M  Provider: Gaspar Cola, MD  DOB: [birth year]  DOS: 08/17/2020  Specialty: Interventional Pain Management  MRN: [id number]  Setting: Ambulatory outpatient  PCP: Libby Maw, MD  Type: Established Patient    Referring Provider: Libby Maw,*  Location: Office  Delivery: Face-to-face     HPI  Reason for encounter: Erika [name], a 49 y.o. year old female, is here today for evaluation and management of her Chronic pain syndrome [G89.4]. Erika [name] primary complain today is Hip Pain (left) Last encounter: Practice (08/03/2020). My last encounter with her was on 08/02/2020. Pertinent problems: Erika [name] has Cluster headache; Chest pain with high risk for cardiac etiology; Other chronic pain; Bilateral hip pain; Chronic ankle pain (Bilateral); Tendinopathy of right gluteus medius; Tendinopathy of gluteus medius (Left); PVD (peripheral vascular disease) ([place]); Chronic pain syndrome; Chronic headaches (1ry area of Pain) (Right); Neurogenic pain; Atypical facial pain (Right); Chronic ear pain (Right); Chronic jaw pain (Right); Geniculate Neuralgia (Right); History of carotid endarterectomy (Right); Right hip pain; Neuropathy of right radial nerve; Radial nerve palsy (Right); Chronic hip pain (Left); Osteoarthritis of hip (Left); Gluteal tendonitis of buttock (Left); Chronic migraine; Neuropathy; and Foot drop, left on their pertinent problem list. Pain Assessment: Severity of Chronic  pain is reported as a 0-No pain/10. Location: Hip Left/into thigh. Onset: More than a month ago. Quality: Nagging, Aching. Timing: Intermittent. Modifying factor(s): injection. Vitals:  height is 5' 7" (1.702 m) and weight is 186 lb (84.4 kg). Her temporal temperature is 97.8 F (36.6 C). Her blood pressure is 187/124 (abnormal) and her pulse is 103 (abnormal). Her respiration is 16 and oxygen saturation is 95%.   The patient indicates having done great after her left intra-articular hip joint injection and left gluteus medius tendon injection done on 08/02/2020.  At this point she indicates having 100% relief of her hip pain. RTC before 12/07/2020.  11/05/2020 for medication management. Currently on oxycodone IR 5 mg, 1 tablet p.o. twice daily (15 MME).  Consider switching to hydrocodone/APAP 5/325 1 tablet p.o. 3 times daily (15 MME). Today we switched her medication to hydrocodone starting with 09/06/2020 prescription. Switched today.  Post-Procedure Evaluation  Procedure (08/02/2020): Diagnostic left IA hip injection #1 + therapeutic left gluteus medius tendon injection #2 under fluoroscopic guidance and IV sedation Pre-procedure pain level: 4/10 Post-procedure: 0/10 (100% relief)  Sedation: Sedation provided.  Effectiveness during initial hour after procedure(Ultra-Short Term Relief): 100 %.  Local anesthetic used: Long-acting (4-6 hours) Effectiveness: Defined as any analgesic benefit obtained secondary to the administration of local anesthetics. This carries significant diagnostic value as to the etiological location, or anatomical origin, of the pain. Duration of benefit is expected to coincide with the duration of the local anesthetic used.  Effectiveness during initial 4-6 hours after procedure(Short-Term Relief): 100 %.  Long-term benefit: Defined as any relief past the pharmacologic duration of the local anesthetics.  Effectiveness past the initial 6 hours after procedure(Long-Term  Relief): 100 %.  Current benefits: Defined as benefit that persist at this time.   Analgesia:  100% better Function: Erika [name] reports improvement in  function ROM: Erika [name] reports improvement in ROM  Pharmacotherapy Assessment   Analgesic: Oxycodone IR 5 mg. 1 tab PO BID (10 mg/day of oxycodone) MME/day: 15 mg/day.   Monitoring: [place] PMP: PDMP reviewed during this encounter.       Pharmacotherapy: No side-effects or adverse reactions reported. Compliance: No problems identified. Effectiveness: Clinically acceptable.  Dewayne Shorter, RN  08/17/2020 11:01 AM  Signed Safety precautions to be maintained throughout the outpatient stay will include: orient to surroundings, keep bed in low position, maintain call bell within reach at all times, provide assistance with transfer out of bed and ambulation.    UDS:  Summary  Date Value Ref Range Status  06/23/2020 Note  Corrected    Comment:    ==================================================================== [facility] (MW) ==================================================================== Test                             Result       Flag       Units  Drug Present and Declared for Prescription Verification   Clonazepam                     28           EXPECTED   ng/mg creat   7-aminoclonazepam              303          EXPECTED   ng/mg creat    Source of clonazepam is a scheduled prescription medication. 7-    aminoclonazepam is an expected metabolite of clonazepam.  Drug Absent but Declared for Prescription Verification   Oxycodone                      Not Detected UNEXPECTED ng/mg creat ==================================================================== Test                      Result    Flag   Units      Ref Range   Creatinine              105              mg/dL      >=20 ==================================================================== Declared Medications:  The flagging and interpretation on this report  are based on the  following declared medications.  Unexpected results may arise from  inaccuracies in the declared medications.   **Note: The testing scope of this panel includes these medications:   Clonazepam (Klonopin)  Oxycodone (Roxicodone)   **Note: The testing scope of this panel does not include the  following reported medications:   Albuterol (Ventolin HFA)  Amlodipine (Norvasc)  Amoxicillin (Amoxil)  Aspirin  Benztropine (Cogentin)  Carvedilol (Coreg)  Chlorpromazine  Clonidine (Catapres)  Clopidogrel (Plavix)  Dapagliflozin (Farxiga)  Evolocumab  Fluoxetine  Insulin (Toujeo)  Isosorbide (Imdur)  Lamotrigine (Lamictal)  Losartan (Cozaar)  Mouthwash  Naloxone (Narcan)  Nicotine  Nitroglycerin (Nitrostat)  Ondansetron (Zofran)  Pregabalin (Lyrica)  Spironolactone (Aldactone)  Vitamin D3 ==================================================================== For clinical consultation, please call [phone]. ====================================================================      ROS  Constitutional: Denies any fever or chills Gastrointestinal: No reported hemesis, hematochezia, vomiting, or acute GI distress Musculoskeletal: Denies any acute onset joint swelling, redness, loss of ROM, or weakness Neurological: No reported episodes of acute onset apraxia, aphasia, dysarthria, agnosia, amnesia, paralysis, loss of coordination, or loss of consciousness  Medication Review  Evolocumab, FLUoxetine HCl,  HYDROcodone-acetaminophen, Insulin Syringe-Needle U-100, albuterol, amLODipine, aspirin EC, benztropine, carvedilol, chlorproMAZINE, cloNIDine, clonazePAM, clopidogrel, dapagliflozin propanediol, insulin glargine (2 Unit Dial), insulin lispro, isosorbide mononitrate, lamoTRIgine, losartan, magic mouthwash, naloxone, nicotine, nitroGLYCERIN, ondansetron, and spironolactone  History Review  Allergy: Erika [name] is allergic to chantix [varenicline  tartrate]. Drug: Erika [name]  reports no history of drug use. Alcohol:  reports previous alcohol use. Tobacco:  reports that she has been smoking cigarettes. She has a 6.75 pack-year smoking history. She has never used smokeless tobacco. Social: Erika [name]  reports that she has been smoking cigarettes. She has a 6.75 pack-year smoking history. She has never used smokeless tobacco. She reports previous alcohol use. She reports that she does not use drugs. Medical:  has a past medical history of Arthralgia of temporomandibular joint, CAD, multiple vessel, Carotid arterial disease ([place]), Clotting disorder ([place]), Depression, Diastolic dysfunction, Fatty liver disease, nonalcoholic (6789), History of blood transfusion, HLD (hyperlipidemia), Labile hypertension, Myocardial infarction ([place]) (2017), Obesity, PAD (peripheral artery disease) ([place]), PTSD (post-traumatic stress disorder), Tobacco abuse, and Type 2 diabetes mellitus [facility]) (January 2017). Surgical: Erika [name]  has a past surgical history that includes Cholecystectomy; Tonsillectomy; Cesarean section; Cardiac catheterization ([name], 04/18/2016); Cardiac catheterization ([name], 06/29/2016); Coronary artery bypass graft ([name], 07/06/2016); TEE without cardioversion ([name], 07/06/2016); Endarterectomy (Right, 07/06/2016); Cardiac catheterization ([name], 08/29/2016); ABDOMINAL AORTOGRAM W/LOWER EXTREMITY ([name], 10/15/2018); PERIPHERAL VASCULAR INTERVENTION (Left, 10/15/2018); ABDOMINAL AORTOGRAM W/LOWER EXTREMITY (Bilateral, 08/19/2019); Endarterectomy (Right, 04/27/2020); and LEFT HEART CATH AND CORS/GRAFTS ANGIOGRAPHY ([name], 08/24/2020). Family: family history includes Alcohol abuse in her father; Anxiety disorder in her sister; Diabetes in her father and mother; Drug abuse in her father; Heart disease in her father; Stroke in her sister. She was adopted.  Laboratory Chemistry Profile   Renal Lab Results  Component Value Date   BUN 13 08/17/2020   CREATININE 0.92  08/17/2020   LABCREA 64.9 03/04/2019   BCR 12 08/13/2019   GFR 132.97 09/07/2019   GFRAA >60 08/17/2020   GFRNONAA >60 08/17/2020   LABVMA 1.5 03/04/2019   LABVMA 2.3 03/04/2019     Hepatic Lab Results  Component Value Date   AST 19 04/22/2020   ALT 23 04/22/2020   ALBUMIN 4.1 04/22/2020   ALKPHOS 81 04/22/2020   HCVAB <0.1 09/15/2019   LIPASE 34 05/28/2009     Electrolytes Lab Results  Component Value Date   NA 132 (L) 08/17/2020   K 3.7 08/17/2020   CL 93 (L) 08/17/2020   CALCIUM 10.2 08/17/2020   MG 2.0 01/07/2019   PHOS 3.1 05/19/2017     Bone Lab Results  Component Value Date   VD25OH 14.27 (L) 01/18/2020   25OHVITD1 16 (L) 01/07/2019   25OHVITD2 <1.0 01/07/2019   25OHVITD3 15 01/07/2019     Inflammation (CRP: Acute Phase) (ESR: Chronic Phase) Lab Results  Component Value Date   CRP 15 (H) 01/07/2019   ESRSEDRATE 17 03/17/2019   LATICACIDVEN 2.3 ([place]) 05/19/2017       Note: Above Lab results reviewed.  Recent Imaging Review  CARDIAC CATHETERIZATION  Ost Cx to Prox Cx lesion is 100% stenosed.  Ost LAD to Prox LAD lesion is 85% stenosed.  Mid LAD lesion is 100% stenosed.  The left ventricular systolic function is normal.  LV end diastolic pressure is mildly elevated.  The left ventricular ejection fraction is 55-65% by visual estimate.  Prox RCA lesion is 95% stenosed.  Mid RCA lesion is 100% stenosed.  SVG graft was visualized by angiography and is normal in  caliber.  The graft exhibits no disease.  SVG and is normal in caliber.  The graft exhibits no disease.  2nd Diag lesion is 95% stenosed.  SVG graft was visualized by angiography and is normal in caliber.  The graft exhibits no disease.  LIMA and is normal in caliber.  The graft exhibits no disease.  Dist LAD-1 lesion is 60% stenosed.  Dist LAD-2 lesion is 40% stenosed.   1.  Severe underlying three-vessel coronary artery disease with patent  grafts including LIMA to  LAD, sequential vein graft to second diagonal/  second OM and SVG to right PDA.  The LAD distal to the LIMA anastomosis is  relatively small in size and diffusely diseased.  No significant change  from before. 2.  Normal LV systolic function mildly elevated left ventricular  end-diastolic pressure.  Recommendations: The patient has severe progression of native coronary artery disease.   However, all grafts are patent.  Recommend continuing medical therapy. Note: Reviewed        Physical Exam  General appearance: Well nourished, well developed, and well hydrated. In no apparent acute distress Mental status: Alert, oriented x 3 (person, place, & time)       Respiratory: No evidence of acute respiratory distress Eyes: PERLA Vitals: BP (!) 187/124 (BP Location: Right Arm, Patient Position: Sitting, Cuff Size: Normal)    Pulse (!) 103    Temp 97.8 F (36.6 C) (Temporal)    Resp 16    Ht 5' 7" (1.702 m)    Wt 186 lb (84.4 kg)    LMP 08/15/2020 (Approximate)    SpO2 95%    BMI 29.13 kg/m  BMI: Estimated body mass index is 29.13 kg/m as calculated from the following:   Height as of this encounter: 5' 7" (1.702 m).   Weight as of this encounter: 186 lb (84.4 kg). Ideal: Ideal body weight: 61.6 kg (135 lb 12.9 oz) Adjusted ideal body weight: 70.7 kg (155 lb 14.1 oz)  Assessment   Status Diagnosis  Controlled Resolved Resolved 1. Chronic pain syndrome   2. Chronic hip pain (Left)   3. Gluteal tendonitis of buttock (Left)   4. Chronic headaches (1ry area of Pain) (Right)   5. Chronic jaw pain (Right)   6. Geniculate Neuralgia (Right)      Updated Problems: No problems updated.  Plan of Care  Problem-specific:  No problem-specific Assessment & Plan notes found for this encounter.  Erika [name] has a current medication list which includes the following long-term medication(s): albuterol, amlodipine, benztropine, carvedilol, chlorpromazine, clonazepam, fluoxetine hcl,  toujeo max solostar, toujeo max solostar, insulin lispro, lamotrigine, losartan, nitroglycerin, spironolactone, [START ON 09/08/2020] hydrocodone-acetaminophen, and isosorbide mononitrate.  Pharmacotherapy (Medications Ordered): Meds ordered this encounter  Medications   HYDROcodone-acetaminophen (NORCO/VICODIN) 5-325 MG tablet    Sig: Take 1 tablet by mouth 3 (three) times daily as needed for severe pain. Must last 30 days.    Dispense:  90 tablet    Refill:  0    Chronic Pain: STOP Act (Not applicable) Fill 1 day early if closed on refill date. Do not fill until: 09/08/2020. To last until: 10/08/2020. Avoid benzodiazepines within 8 hours of opioids   HYDROcodone-acetaminophen (NORCO/VICODIN) 5-325 MG tablet    Sig: Take 1 tablet by mouth 3 (three) times daily as needed for severe pain. Must last 30 days.    Dispense:  90 tablet    Refill:  0    Chronic Pain: STOP Act (Not applicable)  Fill 1 day early if closed on refill date. Do not fill until: 10/08/2020. To last until: 11/07/2020. Avoid benzodiazepines within 8 hours of opioids   HYDROcodone-acetaminophen (NORCO/VICODIN) 5-325 MG tablet    Sig: Take 1 tablet by mouth 3 (three) times daily as needed for severe pain. Must last 30 days.    Dispense:  90 tablet    Refill:  0    Chronic Pain: STOP Act (Not applicable) Fill 1 day early if closed on refill date. Do not fill until: 11/07/2020. To last until: 12/07/2020. Avoid benzodiazepines within 8 hours of opioids   Orders:  No orders of the defined types were placed in this encounter.  Follow-up plan:   Return in about 16 weeks (around 12/07/2020) for (20-min), (F2F), (Med Mgmt).      Interventional management options: Considering:   NOTE: PLAVIX ANTICOAGULATION(Stop:7-10 days pre-procedure; Restart:2 hours post-proc.) Diagnostic right facial nerve block Diagnostic right geniculate nerve block  Diagnostic right occipital nerve block  Possible right occipital  nerveRFA Diagnostic Botox injections   Palliative PRN treatment(s):   Therapeutic left peri-insertional gluteus medius tendon injection #2   Therapeutic left IA hip joint injection #2  Therapeutic/palliative Facial (CN VII) nerve block  #4  Therapeutic/palliative right facial/Geniculatenerve block #3    Recent Visits Date Type Provider Dept  08/17/20 Office Visit Milinda Pointer, MD [facility]  08/02/20 Procedure visit Milinda Pointer, MD [facility]  07/25/20 Office Visit Milinda Pointer, MD [facility]  06/22/20 Telemedicine Milinda Pointer, MD [facility]  Showing recent visits within past 90 days and meeting all other requirements Future Appointments Date Type Provider Dept  11/23/20 Appointment Milinda Pointer, MD [facility]  Showing future appointments within next 90 days and meeting all other requirements  I discussed the assessment and treatment plan with the patient. The patient was provided an opportunity to ask questions and all were answered. The patient agreed with the plan and demonstrated an understanding of the instructions.  Patient advised to call back or seek an in-person evaluation if the symptoms or condition worsens.  Duration of encounter: 30 minutes.  Note by: Gaspar Cola, MD Date: 08/17/2020; Time: 2:39 PM

## 2020-08-22 ENCOUNTER — Ambulatory Visit (INDEPENDENT_AMBULATORY_CARE_PROVIDER_SITE_OTHER): Payer: Managed Care, Other (non HMO) | Admitting: Cardiovascular Disease

## 2020-08-22 ENCOUNTER — Encounter: Payer: Self-pay | Admitting: Emergency Medicine

## 2020-08-22 ENCOUNTER — Emergency Department
Admission: EM | Admit: 2020-08-22 | Discharge: 2020-08-22 | Disposition: A | Discharge status: Home / Self Care | Payer: Managed Care, Other (non HMO) | Attending: Emergency Medicine | Admitting: Emergency Medicine

## 2020-08-22 ENCOUNTER — Other Ambulatory Visit
Admit: 2020-08-22 | Discharge: 2020-08-22 | Disposition: A | Discharge status: Home / Self Care | Payer: Managed Care, Other (non HMO) | Attending: Cardiovascular Disease | Admitting: Cardiovascular Disease

## 2020-08-22 ENCOUNTER — Encounter: Payer: Self-pay | Admitting: Cardiovascular Disease

## 2020-08-22 ENCOUNTER — Telehealth: Payer: Self-pay | Admitting: Emergency Medicine

## 2020-08-22 ENCOUNTER — Other Ambulatory Visit: Payer: Self-pay

## 2020-08-22 VITALS — BP 140/90 | HR 91 | Ht 67.0 in | Wt 191.1 lb

## 2020-08-22 DIAGNOSIS — R7989 Other specified abnormal findings of blood chemistry: Secondary | ICD-10-CM | POA: Insufficient documentation

## 2020-08-22 DIAGNOSIS — Z720 Tobacco use: Secondary | ICD-10-CM

## 2020-08-22 DIAGNOSIS — E1151 Type 2 diabetes mellitus with diabetic peripheral angiopathy without gangrene: Secondary | ICD-10-CM

## 2020-08-22 DIAGNOSIS — Z951 Presence of aortocoronary bypass graft: Secondary | ICD-10-CM

## 2020-08-22 DIAGNOSIS — Z20822 Contact with and (suspected) exposure to covid-19: Secondary | ICD-10-CM | POA: Insufficient documentation

## 2020-08-22 DIAGNOSIS — Z5321 Procedure and treatment not carried out due to patient leaving prior to being seen by health care provider: Secondary | ICD-10-CM | POA: Diagnosis not present

## 2020-08-22 DIAGNOSIS — I739 Peripheral vascular disease, unspecified: Secondary | ICD-10-CM

## 2020-08-22 DIAGNOSIS — I25110 Atherosclerotic heart disease of native coronary artery with unstable angina pectoris: Secondary | ICD-10-CM

## 2020-08-22 DIAGNOSIS — I6523 Occlusion and stenosis of bilateral carotid arteries: Secondary | ICD-10-CM

## 2020-08-22 LAB — TROPONIN I (HIGH SENSITIVITY): Troponin I (High Sensitivity): 52 ng/L — ABNORMAL HIGH (ref <18)

## 2020-08-22 LAB — SARS CORONAVIRUS 2 (TAT 6-24 HRS): SARS Coronavirus 2: NEGATIVE

## 2020-08-22 MED ORDER — ISOSORBIDE MONONITRATE ER 60 MG PO TB24: ORAL_TABLET | ORAL | Status: DC

## 2020-08-22 NOTE — ED Notes (Signed)
No answer when called several times from lobby 

## 2020-08-22 NOTE — ED Triage Notes (Signed)
Pt reports was here Friday and had labs but left because the wait was too long. Pt reports her MD called and advised her to come to the ED because her troponin was 19. Pt denies pain, SOB or other symtpoms.

## 2020-08-22 NOTE — Progress Notes (Signed)
Cardiology Office Note  Date:  08/22/2020   ID:  Erika, [name] November 09, 1971, MRN [id number]  PCP:  Erika Maw, MD   Chief Complaint  Patient presents with  . other    Follow up elevated troponins; was at [facility] ER but due to the wait needs to be evaluated. Pt. c/o upper back pain, LE edema & legs and feet numbness.     HPI:  Erika [name] is a 49 y.o. female  with past medical history of Coronary artery disease, History of CABG Lower extremity disease/PAD prior intervention Carotid disease, right carotid endarterectomy Who presents for talk of the day add on for unstable angina symptoms  She reports having angina, stuttering over the past week or more 3 days ago with back pain which was her typical anginal symptoms, called EMS, went to the emergency room had low troponin, did not stay in the hospital over the weekend  Current symptoms in the past 24 hours again went to the emergency room troponin up to 50 but left that she was told it was going to be a 10-hour wait to see a physician  She feels that she needs a heart catheterization given worsening symptoms, Increased use of sublingual nitro Review of notes note indicates no recent ischemic work-up  Other past medical history reviewed  non-ST elevation myocardial infarction in 2017.  Catheterization showing three-vessel coronary artery disease underwent CABG and right carotid endarterectomy.   rehospitalized in September, 2017 with chest pain in the setting of uncontrolled hypertension.  Cardiac catheterization showed patent grafts . The LAD had diffuse disease distal to the anastomosis and was very small in caliber. Ejection fraction was normal with mildly elevated left ventricular end-diastolic pressure.   Tolerating her PCSK9 inhibitor Still working on her diabetes with endocrinology Continues to smoke but is trying to quit  EKG personally reviewed by myself on todays visit Normal sinus rhythm, T  wave abnormality 1 and aVL  Other past medical history reviewed previous stenting of the left SFA.  In follow-up, occluded left SFA.  Chronic right leg claudication After work-up, noted to have bilateral SFA occlusion with moderately reduced ABI.  At baseline with severe claudication walking short distance.    PMH:   has a past medical history of Arthralgia of temporomandibular joint, CAD, multiple vessel, Carotid arterial disease ([place]), Clotting disorder ([place]), Depression, Diastolic dysfunction, Fatty liver disease, nonalcoholic (8338), History of blood transfusion, HLD (hyperlipidemia), Labile hypertension, Myocardial infarction ([place]) (2017), Obesity, PAD (peripheral artery disease) ([place]), PTSD (post-traumatic stress disorder), Tobacco abuse, and Type 2 diabetes mellitus [facility]) (January 2017).  PSH:    Past Surgical History:  Procedure Laterality Date  . ABDOMINAL AORTOGRAM W/LOWER EXTREMITY [name] 10/15/2018   Procedure: ABDOMINAL AORTOGRAM W/LOWER EXTREMITY;  Surgeon: Wellington Hampshire, MD;  Location: [place] [facility];  Service: Cardiovascular;  Laterality: [name];  . ABDOMINAL AORTOGRAM W/LOWER EXTREMITY Bilateral 08/19/2019   Procedure: ABDOMINAL AORTOGRAM W/LOWER EXTREMITY;  Surgeon: Wellington Hampshire, MD;  Location: [place] [facility];  Service: Cardiovascular;  Laterality: Bilateral;  . CARDIAC CATHETERIZATION [name] 06/29/2016   Procedure: Left Heart Cath and Coronary Angiography;  Surgeon: Minna Merritts, MD;  Location: [place] [facility];  Service: Cardiovascular;  Laterality: [name];  . CARDIAC CATHETERIZATION [name] 08/29/2016   Procedure: Left Heart Cath and Cors/Grafts Angiography;  Surgeon: Wellington Hampshire, MD;  Location: [place] [facility];  Service: Cardiovascular;  Laterality: [name];  . CESAREAN SECTION    . CHOLECYSTECTOMY    .  CORONARY ARTERY BYPASS GRAFT [name] 07/06/2016   Procedure: CORONARY ARTERY BYPASS GRAFTING (CABG) x four, using left internal mammary artery and right leg greater  saphenous vein harvested endoscopically;  Surgeon: Ivin Poot, MD;  Location: [place];  Service: Open Heart Surgery;  Laterality: [name];  . ENDARTERECTOMY Right 07/06/2016   Procedure: ENDARTERECTOMY CAROTID;  Surgeon: Rosetta Posner, MD;  Location: [place];  Service: Vascular;  Laterality: Right;  . ENDARTERECTOMY Right 04/27/2020   Procedure: REDO OF RIGHT ENDARTERECTOMY CAROTID;  Surgeon: Rosetta Posner, MD;  Location: [place];  Service: Vascular;  Laterality: Right;  . PERIPHERAL VASCULAR CATHETERIZATION [name] 04/18/2016   Procedure: Renal Angiography;  Surgeon: Wellington Hampshire, MD;  Location: [place] [facility];  Service: Cardiovascular;  Laterality: [name];  . PERIPHERAL VASCULAR INTERVENTION Left 10/15/2018   Procedure: PERIPHERAL VASCULAR INTERVENTION;  Surgeon: Wellington Hampshire, MD;  Location: [place] [facility];  Service: Cardiovascular;  Laterality: Left;  Left superficial femoral  . TEE WITHOUT CARDIOVERSION [name] 07/06/2016   Procedure: TRANSESOPHAGEAL ECHOCARDIOGRAM (TEE);  Surgeon: Ivin Poot, MD;  Location: [place];  Service: Open Heart Surgery;  Laterality: [name];  . TONSILLECTOMY      Current Outpatient Medications  Medication Sig Dispense Refill  . albuterol (PROVENTIL HFA;VENTOLIN HFA) 108 (90 Base) MCG/ACT inhaler Inhale 2 puffs into the lungs every 6 (six) hours as needed for wheezing. 1 Inhaler 0  . amLODipine (NORVASC) 10 MG tablet Take 1 tablet (10 mg total) by mouth daily. 90 tablet 3  . aspirin EC 81 MG tablet Take 1 tablet (81 mg total) by mouth daily. 90 tablet 3  . benztropine (COGENTIN) 0.5 MG tablet Take 1 tablet (0.5 mg total) by mouth at bedtime. 90 tablet 0  . carvedilol (COREG) 25 MG tablet Take 1 tablet (25 mg total) by mouth 2 (two) times daily with a meal. 60 tablet 11  . chlorproMAZINE (THORAZINE) 100 MG tablet Take 1 tablet (100 mg total) by mouth at bedtime. 30 tablet 0  . clonazePAM (KLONOPIN) 0.5 MG tablet Take 1 tablet (0.5 mg total) by mouth 3 (three) times daily  as needed for anxiety. 90 tablet 2  . clopidogrel (PLAVIX) 75 MG tablet TAKE 1 TABLET BY MOUTH EVERY DAY 30 tablet 3  . dapagliflozin propanediol (FARXIGA) 5 MG TABS tablet Take 5 mg by mouth daily before breakfast. 30 tablet 11  . Evolocumab (REPATHA SURECLICK) 341 MG/ML SOAJ Inject 1 pen into the skin every 14 (fourteen) days. 2 pen 11  . FLUoxetine HCl 60 MG TABS Take 60 mg by mouth daily. 90 tablet 0  . [START ON 09/08/2020] HYDROcodone-acetaminophen (NORCO/VICODIN) 5-325 MG tablet Take 1 tablet by mouth 3 (three) times daily as needed for severe pain. Must last 30 days. 90 tablet 0  . insulin glargine, 2 Unit Dial, (TOUJEO MAX SOLOSTAR) 300 UNIT/ML Solostar Pen Inject 110 Units into the skin every morning. (Patient taking differently: Inject 110 Units into the skin in the morning. ) 4 pen 11  . insulin lispro (HUMALOG KWIKPEN) 100 UNIT/ML KwikPen Inject 0.03 mLs (3 Units total) into the skin 3 (three) times daily with meals. Take only if blood sugar is over 250.  Also pen needles 4/day 15 mL 11  . Insulin Syringe-Needle U-100 29G X 1/2" 0.3 ML MISC 1 each by Does not apply route 2 (two) times daily. 30 each 0  . isosorbide mononitrate (IMDUR) 60 MG 24 hr tablet Tale 1 tablet (36m) in the am and 1/2  tablet (16m) in the pm.    . lamoTRIgine (LAMICTAL) 200 MG tablet Take 1 tablet (200 mg total) by mouth daily. 90 tablet 0  . losartan (COZAAR) 100 MG tablet Take 1 tablet (100 mg total) by mouth daily. 30 tablet 11  . magic mouthwash SOLN Take 15 mLs by mouth 3 (three) times daily as needed for mouth pain. 250 mL 1  . naloxone (NARCAN) nasal spray 4 mg/0.1 mL 4 mg (contents of 1 nasal spray) as a single dose in one nostril; may repeat every 2 to 3 minutes in alternating nostrils until medical assistance becomes available 1 kit 0  . nicotine (NICODERM CQ - DOSED IN MG/24 HOURS) 14 mg/24hr patch 14 mg daily.    . nitroGLYCERIN (NITROSTAT) 0.4 MG SL tablet Place 1 tablet (0.4 mg total) under the tongue  every 5 (five) minutes as needed for chest pain. 25 tablet 98  . ondansetron (ZOFRAN) 4 MG tablet Take 1 tablet (4 mg total) by mouth daily as needed. 20 tablet 11  . pregabalin (LYRICA) 225 MG capsule Take 1 capsule (225 mg total) by mouth 3 (three) times daily. 90 capsule 2  . spironolactone (ALDACTONE) 25 MG tablet Take 1 tablet (25 mg total) by mouth daily. 90 tablet 3  . [START ON 10/08/2020] HYDROcodone-acetaminophen (NORCO/VICODIN) 5-325 MG tablet Take 1 tablet by mouth 3 (three) times daily as needed for severe pain. Must last 30 days. (Patient not taking: Reported on 08/22/2020) 90 tablet 0  . [START ON 11/07/2020] HYDROcodone-acetaminophen (NORCO/VICODIN) 5-325 MG tablet Take 1 tablet by mouth 3 (three) times daily as needed for severe pain. Must last 30 days. (Patient not taking: Reported on 08/22/2020) 90 tablet 0  . insulin glargine, 2 Unit Dial, (TOUJEO MAX SOLOSTAR) 300 UNIT/ML Solostar Pen Inject 110 Units into the skin every morning.  (Patient not taking: Reported on 08/22/2020)     No current facility-administered medications for this visit.     Allergies:   Chantix [varenicline tartrate]   Social History:  The patient  reports that she has been smoking cigarettes. She has a 6.75 pack-year smoking history. She has never used smokeless tobacco. She reports previous alcohol use. She reports that she does not use drugs.   Family History:   family history includes Alcohol abuse in her father; Anxiety disorder in her sister; Diabetes in her father and mother; Drug abuse in her father; Heart disease in her father; Stroke in her sister. She was adopted.    Review of Systems: Review of Systems  Constitutional: Negative.   HENT: Negative.   Respiratory: Negative.   Cardiovascular: Positive for chest pain.  Gastrointestinal: Negative.   Musculoskeletal: Negative.   Neurological: Negative.   Psychiatric/Behavioral: Negative.   All other systems reviewed and are  negative.    PHYSICAL EXAM: VS:  BP 140/90 (BP Location: Left Arm, Patient Position: Sitting, Cuff Size: Normal)   Pulse 91   Ht _0  (1.702 m)   Wt 191 lb 2 oz (86.7 kg)   LMP 08/15/2020 (Approximate)   SpO2 98%   BMI 29.93 kg/m  , BMI Body mass index is 29.93 kg/m. GEN: Well nourished, well developed, in no acute distress HEENT: normal Neck: no JVD, carotid bruits, or masses Cardiac: RRR; no murmurs, rubs, or gallops,no edema  Respiratory:  clear to auscultation bilaterally, normal work of breathing GI: soft, nontender, nondistended, + BS MS: no deformity or atrophy Skin: warm and dry, no rash Neuro:  Strength and sensation are intact  Psych: euthymic mood, full affect   Recent Labs: 09/15/2019: TSH 1.693 04/22/2020: ALT 23 08/17/2020: BUN 13; Creatinine, Ser 0.92; Hemoglobin 16.0; Platelets 381; Potassium 3.7; Sodium 132    Lipid Panel Lab Results  Component Value Date   CHOL 157 09/07/2019   HDL 38.70 (L) 09/07/2019   LDLCALC 80 09/07/2019   TRIG 191.0 (H) 09/07/2019      Wt Readings from Last 3 Encounters:  08/22/20 191 lb 2 oz (86.7 kg)  08/22/20 186 lb (84.4 kg)  08/17/20 186 lb (84.4 kg)       ASSESSMENT AND PLAN:  Problem List Items Addressed This Visit      Cardiology Problems   CAD (coronary artery disease) - Primary (Chronic)   Relevant Medications   isosorbide mononitrate (IMDUR) 60 MG 24 hr tablet   Other Relevant Orders   EKG 12-Lead   Carotid stenosis (Chronic)   Relevant Medications   isosorbide mononitrate (IMDUR) 60 MG 24 hr tablet     Other   Tobacco abuse   S/P CABG x 4 (Chronic)    Other Visit Diagnoses    PAD (peripheral artery disease) (HCC)       Relevant Medications   isosorbide mononitrate (IMDUR) 60 MG 24 hr tablet   Type 2 diabetes mellitus with diabetic peripheral angiopathy without gangrene, without long-term current use of insulin (HCC)       Relevant Medications   isosorbide mononitrate (IMDUR) 60 MG 24 hr tablet      Unstable angina, elevated troponin Seen twice in the emergency room in the past 3 days, left without work-up She does not want to sit in the emergency room for 10 hours Given worsening symptoms, increased nitro use, she would like left heart catheterization with evaluation of her grafts.  I feel this is her best option given worsening symptoms We will check Covid swab today, plan on catheterization on Wednesday, September 1 with Dr. Fletcher Anon in [place] I have reviewed the risks, indications, and alternatives to cardiac catheterization, possible angioplasty, and stenting with the patient. Risks include but are not limited to bleeding, infection, vascular injury, stroke, myocardial infection, arrhythmia, kidney injury, radiation-related injury in the case of prolonged fluoroscopy use, emergency cardiac surgery, and death. The patient understands the risks of serious complication is 1-2 in 6503 with diagnostic cardiac cath and 1-2% or less with angioplasty/stenting.  --Case was discussed with Dr. Fletcher Anon  Smoking We have encouraged her to continue to work on weaning her cigarettes and smoking cessation. She will continue to work on this and does not want any assistance with chantix.   Type 2 diabetes poorly controlled Stressed importance of working closely with endocrinology to get her numbers down, strict diet  Disposition:   F/U 3 weeks with Dr. Fletcher Anon   Total encounter time more than 45 minutes  Greater than 50% was spent in counseling and coordination of care with the patient    Signed, Esmond Plants, M.D., Ph.D. [place], [place]

## 2020-08-22 NOTE — Telephone Encounter (Signed)
Called patient due to lwot to inquire about condition and follow up plans. No answer and voice mail box  Is full so I cannot leave message.

## 2020-08-22 NOTE — Patient Instructions (Addendum)
Covid test today for outpt cath on Wednesday  Cardiac cath with Dr. Fletcher Anon on Wednesday  Medication Instructions:   Try and extra 1/2 pill imdur in the PM  If you need a refill on your cardiac medications before your next appointment, please call your pharmacy.    Lab work: No new labs needed   If you have labs (blood work) drawn today and your tests are completely normal, you will receive your results only by: Marland Kitchen MyChart Message (if you have MyChart) OR . A paper copy in the mail If you have any lab test that is abnormal or we need to change your treatment, we will call you to review the results.   Testing/Procedures: No new testing needed   Follow-Up: At [facility], you and your health needs are our priority.  As part of our continuing mission to provide you with exceptional heart care, we have created designated Provider Care Teams.  These Care Teams include your primary Cardiologist (physician) and Advanced Practice Providers (APPs -  Physician Assistants and Nurse Practitioners) who all work together to provide you with the care you need, when you need it.  . You will need a follow up appointment in 3 weeks with Dr. Fletcher Anon  . Providers on your designated Care Team:   . Murray Hodgkins, NP . Christell Faith, PA-C . Marrianne Mood, PA-C  Any Other Special Instructions Will Be Listed Below (If Applicable).  COVID-19 Vaccine Information can be found at: ShippingScam.co.uk For questions related to vaccine distribution or appointments, please email vaccine@[place] .com or call [phone].       [address] [place], [place] [address] Dept: [phone] Loc: [place]  08/22/2020  You are scheduled for a Cardiac Catheterization on Wednesday, September 1 with Dr. [name].  1.  Please arrive at the [facility] (Main Entrance A) at [facility]: [address] [place], [place] [address] at 10:30 AM (This time is two hours before your procedure to ensure your preparation). Free valet parking service is available.   Special note: Every effort is made to have your procedure done on time. Please understand that emergencies sometimes delay scheduled procedures.  2. Diet: Do not eat solid foods after midnight.  The patient may have clear liquids until 5am upon the day of the procedure.  3. Labs: you will need a COVID test today.  4. Medication instructions in preparation for your procedure:   Contrast Allergy: No    Take only 1/2 of you normal  units of insulin the night before your procedure. Do not take any insulin on the day of the procedure.    On the morning of your procedure, take your Aspirin and any morning medicines NOT listed above.  You may use sips of water.  5. Plan for one night stay--bring personal belongings. 6. Bring a current list of your medications and current insurance cards. 7. You MUST have a responsible person to drive you home. 8. Someone MUST be with you the first 24 hours after you arrive home or your discharge will be delayed. 9. Please wear clothes that are easy to get on and off and wear slip-on shoes.  Thank you for allowing [name] to care for you!   -- [place] [facility]

## 2020-08-22 NOTE — H&P (View-Only) (Signed)
Cardiology Office Note  Date:  08/22/2020   ID:  Erika [name] November 09, 1971, MRN [id number]  PCP:  Libby Maw, MD   Chief Complaint  Patient presents with  . other    Follow up elevated troponins; was at [facility] ER but due to the wait needs to be evaluated. Pt. c/o upper back pain, LE edema & legs and feet numbness.     HPI:  Erika [name] is a 49 y.o. female  with past medical history of Coronary artery disease, History of CABG Lower extremity disease/PAD prior intervention Carotid disease, right carotid endarterectomy Who presents for talk of the day add on for unstable angina symptoms  She reports having angina, stuttering over the past week or more 3 days ago with back pain which was her typical anginal symptoms, called EMS, went to the emergency room had low troponin, did not stay in the hospital over the weekend  Current symptoms in the past 24 hours again went to the emergency room troponin up to 50 but left that she was told it was going to be a 10-hour wait to see a physician  She feels that she needs a heart catheterization given worsening symptoms, Increased use of sublingual nitro Review of notes note indicates no recent ischemic work-up  Other past medical history reviewed  non-ST elevation myocardial infarction in 2017.  Catheterization showing three-vessel coronary artery disease underwent CABG and right carotid endarterectomy.   rehospitalized in September, 2017 with chest pain in the setting of uncontrolled hypertension.  Cardiac catheterization showed patent grafts . The LAD had diffuse disease distal to the anastomosis and was very small in caliber. Ejection fraction was normal with mildly elevated left ventricular end-diastolic pressure.   Tolerating her PCSK9 inhibitor Still working on her diabetes with endocrinology Continues to smoke but is trying to quit  EKG personally reviewed by myself on todays visit Normal sinus rhythm, T  wave abnormality 1 and aVL  Other past medical history reviewed previous stenting of the left SFA.  In follow-up, occluded left SFA.  Chronic right leg claudication After work-up, noted to have bilateral SFA occlusion with moderately reduced ABI.  At baseline with severe claudication walking short distance.    PMH:   has a past medical history of Arthralgia of temporomandibular joint, CAD, multiple vessel, Carotid arterial disease ([place]), Clotting disorder ([place]), Depression, Diastolic dysfunction, Fatty liver disease, nonalcoholic (8338), History of blood transfusion, HLD (hyperlipidemia), Labile hypertension, Myocardial infarction ([place]) (2017), Obesity, PAD (peripheral artery disease) ([place]), PTSD (post-traumatic stress disorder), Tobacco abuse, and Type 2 diabetes mellitus [facility]) (January 2017).  PSH:    Past Surgical History:  Procedure Laterality Date  . ABDOMINAL AORTOGRAM W/LOWER EXTREMITY [name] 10/15/2018   Procedure: ABDOMINAL AORTOGRAM W/LOWER EXTREMITY;  Surgeon: Wellington Hampshire, MD;  Location: [place] [facility];  Service: Cardiovascular;  Laterality: [name];  . ABDOMINAL AORTOGRAM W/LOWER EXTREMITY Bilateral 08/19/2019   Procedure: ABDOMINAL AORTOGRAM W/LOWER EXTREMITY;  Surgeon: Wellington Hampshire, MD;  Location: [place] [facility];  Service: Cardiovascular;  Laterality: Bilateral;  . CARDIAC CATHETERIZATION [name] 06/29/2016   Procedure: Left Heart Cath and Coronary Angiography;  Surgeon: Minna Merritts, MD;  Location: [place] [facility];  Service: Cardiovascular;  Laterality: [name];  . CARDIAC CATHETERIZATION [name] 08/29/2016   Procedure: Left Heart Cath and Cors/Grafts Angiography;  Surgeon: Wellington Hampshire, MD;  Location: [place] [facility];  Service: Cardiovascular;  Laterality: [name];  . CESAREAN SECTION    . CHOLECYSTECTOMY    .  CORONARY ARTERY BYPASS GRAFT [name] 07/06/2016   Procedure: CORONARY ARTERY BYPASS GRAFTING (CABG) x four, using left internal mammary artery and right leg greater  saphenous vein harvested endoscopically;  Surgeon: Ivin Poot, MD;  Location: [place];  Service: Open Heart Surgery;  Laterality: [name];  . ENDARTERECTOMY Right 07/06/2016   Procedure: ENDARTERECTOMY CAROTID;  Surgeon: Rosetta Posner, MD;  Location: [place];  Service: Vascular;  Laterality: Right;  . ENDARTERECTOMY Right 04/27/2020   Procedure: REDO OF RIGHT ENDARTERECTOMY CAROTID;  Surgeon: Rosetta Posner, MD;  Location: [place];  Service: Vascular;  Laterality: Right;  . PERIPHERAL VASCULAR CATHETERIZATION [name] 04/18/2016   Procedure: Renal Angiography;  Surgeon: Wellington Hampshire, MD;  Location: [place] [facility];  Service: Cardiovascular;  Laterality: [name];  . PERIPHERAL VASCULAR INTERVENTION Left 10/15/2018   Procedure: PERIPHERAL VASCULAR INTERVENTION;  Surgeon: Wellington Hampshire, MD;  Location: [place] [facility];  Service: Cardiovascular;  Laterality: Left;  Left superficial femoral  . TEE WITHOUT CARDIOVERSION [name] 07/06/2016   Procedure: TRANSESOPHAGEAL ECHOCARDIOGRAM (TEE);  Surgeon: Ivin Poot, MD;  Location: [place];  Service: Open Heart Surgery;  Laterality: [name];  . TONSILLECTOMY      Current Outpatient Medications  Medication Sig Dispense Refill  . albuterol (PROVENTIL HFA;VENTOLIN HFA) 108 (90 Base) MCG/ACT inhaler Inhale 2 puffs into the lungs every 6 (six) hours as needed for wheezing. 1 Inhaler 0  . amLODipine (NORVASC) 10 MG tablet Take 1 tablet (10 mg total) by mouth daily. 90 tablet 3  . aspirin EC 81 MG tablet Take 1 tablet (81 mg total) by mouth daily. 90 tablet 3  . benztropine (COGENTIN) 0.5 MG tablet Take 1 tablet (0.5 mg total) by mouth at bedtime. 90 tablet 0  . carvedilol (COREG) 25 MG tablet Take 1 tablet (25 mg total) by mouth 2 (two) times daily with a meal. 60 tablet 11  . chlorproMAZINE (THORAZINE) 100 MG tablet Take 1 tablet (100 mg total) by mouth at bedtime. 30 tablet 0  . clonazePAM (KLONOPIN) 0.5 MG tablet Take 1 tablet (0.5 mg total) by mouth 3 (three) times daily  as needed for anxiety. 90 tablet 2  . clopidogrel (PLAVIX) 75 MG tablet TAKE 1 TABLET BY MOUTH EVERY DAY 30 tablet 3  . dapagliflozin propanediol (FARXIGA) 5 MG TABS tablet Take 5 mg by mouth daily before breakfast. 30 tablet 11  . Evolocumab (REPATHA SURECLICK) 341 MG/ML SOAJ Inject 1 pen into the skin every 14 (fourteen) days. 2 pen 11  . FLUoxetine HCl 60 MG TABS Take 60 mg by mouth daily. 90 tablet 0  . [START ON 09/08/2020] HYDROcodone-acetaminophen (NORCO/VICODIN) 5-325 MG tablet Take 1 tablet by mouth 3 (three) times daily as needed for severe pain. Must last 30 days. 90 tablet 0  . insulin glargine, 2 Unit Dial, (TOUJEO MAX SOLOSTAR) 300 UNIT/ML Solostar Pen Inject 110 Units into the skin every morning. (Patient taking differently: Inject 110 Units into the skin in the morning. ) 4 pen 11  . insulin lispro (HUMALOG KWIKPEN) 100 UNIT/ML KwikPen Inject 0.03 mLs (3 Units total) into the skin 3 (three) times daily with meals. Take only if blood sugar is over 250.  Also pen needles 4/day 15 mL 11  . Insulin Syringe-Needle U-100 29G X 1/2" 0.3 ML MISC 1 each by Does not apply route 2 (two) times daily. 30 each 0  . isosorbide mononitrate (IMDUR) 60 MG 24 hr tablet Tale 1 tablet (36m) in the am and 1/2  tablet (16m) in the pm.    . lamoTRIgine (LAMICTAL) 200 MG tablet Take 1 tablet (200 mg total) by mouth daily. 90 tablet 0  . losartan (COZAAR) 100 MG tablet Take 1 tablet (100 mg total) by mouth daily. 30 tablet 11  . magic mouthwash SOLN Take 15 mLs by mouth 3 (three) times daily as needed for mouth pain. 250 mL 1  . naloxone (NARCAN) nasal spray 4 mg/0.1 mL 4 mg (contents of 1 nasal spray) as a single dose in one nostril; may repeat every 2 to 3 minutes in alternating nostrils until medical assistance becomes available 1 kit 0  . nicotine (NICODERM CQ - DOSED IN MG/24 HOURS) 14 mg/24hr patch 14 mg daily.    . nitroGLYCERIN (NITROSTAT) 0.4 MG SL tablet Place 1 tablet (0.4 mg total) under the tongue  every 5 (five) minutes as needed for chest pain. 25 tablet 98  . ondansetron (ZOFRAN) 4 MG tablet Take 1 tablet (4 mg total) by mouth daily as needed. 20 tablet 11  . pregabalin (LYRICA) 225 MG capsule Take 1 capsule (225 mg total) by mouth 3 (three) times daily. 90 capsule 2  . spironolactone (ALDACTONE) 25 MG tablet Take 1 tablet (25 mg total) by mouth daily. 90 tablet 3  . [START ON 10/08/2020] HYDROcodone-acetaminophen (NORCO/VICODIN) 5-325 MG tablet Take 1 tablet by mouth 3 (three) times daily as needed for severe pain. Must last 30 days. (Patient not taking: Reported on 08/22/2020) 90 tablet 0  . [START ON 11/07/2020] HYDROcodone-acetaminophen (NORCO/VICODIN) 5-325 MG tablet Take 1 tablet by mouth 3 (three) times daily as needed for severe pain. Must last 30 days. (Patient not taking: Reported on 08/22/2020) 90 tablet 0  . insulin glargine, 2 Unit Dial, (TOUJEO MAX SOLOSTAR) 300 UNIT/ML Solostar Pen Inject 110 Units into the skin every morning.  (Patient not taking: Reported on 08/22/2020)     No current facility-administered medications for this visit.     Allergies:   Chantix [varenicline tartrate]   Social History:  The patient  reports that she has been smoking cigarettes. She has a 6.75 pack-year smoking history. She has never used smokeless tobacco. She reports previous alcohol use. She reports that she does not use drugs.   Family History:   family history includes Alcohol abuse in her father; Anxiety disorder in her sister; Diabetes in her father and mother; Drug abuse in her father; Heart disease in her father; Stroke in her sister. She was adopted.    Review of Systems: Review of Systems  Constitutional: Negative.   HENT: Negative.   Respiratory: Negative.   Cardiovascular: Positive for chest pain.  Gastrointestinal: Negative.   Musculoskeletal: Negative.   Neurological: Negative.   Psychiatric/Behavioral: Negative.   All other systems reviewed and are  negative.    PHYSICAL EXAM: VS:  BP 140/90 (BP Location: Left Arm, Patient Position: Sitting, Cuff Size: Normal)   Pulse 91   Ht _0  (1.702 m)   Wt 191 lb 2 oz (86.7 kg)   LMP 08/15/2020 (Approximate)   SpO2 98%   BMI 29.93 kg/m  , BMI Body mass index is 29.93 kg/m. GEN: Well nourished, well developed, in no acute distress HEENT: normal Neck: no JVD, carotid bruits, or masses Cardiac: RRR; no murmurs, rubs, or gallops,no edema  Respiratory:  clear to auscultation bilaterally, normal work of breathing GI: soft, nontender, nondistended, + BS MS: no deformity or atrophy Skin: warm and dry, no rash Neuro:  Strength and sensation are intact  Psych: euthymic mood, full affect   Recent Labs: 09/15/2019: TSH 1.693 04/22/2020: ALT 23 08/17/2020: BUN 13; Creatinine, Ser 0.92; Hemoglobin 16.0; Platelets 381; Potassium 3.7; Sodium 132    Lipid Panel Lab Results  Component Value Date   CHOL 157 09/07/2019   HDL 38.70 (L) 09/07/2019   LDLCALC 80 09/07/2019   TRIG 191.0 (H) 09/07/2019      Wt Readings from Last 3 Encounters:  08/22/20 191 lb 2 oz (86.7 kg)  08/22/20 186 lb (84.4 kg)  08/17/20 186 lb (84.4 kg)       ASSESSMENT AND PLAN:  Problem List Items Addressed This Visit      Cardiology Problems   CAD (coronary artery disease) - Primary (Chronic)   Relevant Medications   isosorbide mononitrate (IMDUR) 60 MG 24 hr tablet   Other Relevant Orders   EKG 12-Lead   Carotid stenosis (Chronic)   Relevant Medications   isosorbide mononitrate (IMDUR) 60 MG 24 hr tablet     Other   Tobacco abuse   S/P CABG x 4 (Chronic)    Other Visit Diagnoses    PAD (peripheral artery disease) (HCC)       Relevant Medications   isosorbide mononitrate (IMDUR) 60 MG 24 hr tablet   Type 2 diabetes mellitus with diabetic peripheral angiopathy without gangrene, without long-term current use of insulin (HCC)       Relevant Medications   isosorbide mononitrate (IMDUR) 60 MG 24 hr tablet      Unstable angina, elevated troponin Seen twice in the emergency room in the past 3 days, left without work-up She does not want to sit in the emergency room for 10 hours Given worsening symptoms, increased nitro use, she would like left heart catheterization with evaluation of her grafts.  I feel this is her best option given worsening symptoms We will check Covid swab today, plan on catheterization on Wednesday, September 1 with Dr. Fletcher Anon in [place] I have reviewed the risks, indications, and alternatives to cardiac catheterization, possible angioplasty, and stenting with the patient. Risks include but are not limited to bleeding, infection, vascular injury, stroke, myocardial infection, arrhythmia, kidney injury, radiation-related injury in the case of prolonged fluoroscopy use, emergency cardiac surgery, and death. The patient understands the risks of serious complication is 1-2 in 6503 with diagnostic cardiac cath and 1-2% or less with angioplasty/stenting.  --Case was discussed with Dr. Fletcher Anon  Smoking We have encouraged her to continue to work on weaning her cigarettes and smoking cessation. She will continue to work on this and does not want any assistance with chantix.   Type 2 diabetes poorly controlled Stressed importance of working closely with endocrinology to get her numbers down, strict diet  Disposition:   F/U 3 weeks with Dr. Fletcher Anon   Total encounter time more than 45 minutes  Greater than 50% was spent in counseling and coordination of care with the patient    Signed, Esmond Plants, M.D., Ph.D. [place], [place]

## 2020-08-23 ENCOUNTER — Telehealth: Payer: Self-pay | Admitting: *Deleted

## 2020-08-23 NOTE — Telephone Encounter (Signed)
Pt contacted pre-catheterization scheduled at [facility] for: Wednesday August 24, 2020 12:30 PM Verified arrival time and place: [place] [facility]) at: 10:30 AM   No solid food after midnight prior to cath, clear liquids until 5 AM day of procedure.  Hold: Toujeo-AM of procedure Pt reports she does not use HS Insulin. Farxiga-AM of procedure Spironolactone-AM of procedure.  Except hold medications AM meds can be  taken pre-cath with sips of water including: ASA 81 mg Plavix 75 mg   Confirmed patient has responsible adult to drive home post procedure and observe 24 hours after arriving home: yes  You are allowed ONE visitor in the waiting room during the time you are at the hospital for your procedure. Both you and your visitor must wear a mask once you enter the hospital.       COVID-19 Pre-Screening Questions:  . In the past 10 days have you had a new cough, shortness of breath, headache, congestion, fever (100 or greater) unexplained body aches, new sore throat, or sudden loss of taste or sense of smell? no . In the past 10 days have you been around anyone with known Covid 19? no . Have you been vaccinated for COVID-19? no   Reviewed procedure/mask/visitor instructions, COVID-19 questions with patient.

## 2020-08-24 ENCOUNTER — Other Ambulatory Visit: Payer: Self-pay

## 2020-08-24 ENCOUNTER — Encounter (HOSPITAL_COMMUNITY)
Admission: RE | Disposition: A | Discharge status: Home / Self Care | Payer: Managed Care, Other (non HMO) | Source: Home / Self Care | Attending: Cardiovascular Disease

## 2020-08-24 ENCOUNTER — Ambulatory Visit (HOSPITAL_COMMUNITY)
Admission: RE | Admit: 2020-08-24 | Discharge: 2020-08-24 | Disposition: A | Discharge status: Home / Self Care | Payer: Managed Care, Other (non HMO) | Attending: Cardiovascular Disease | Admitting: Cardiovascular Disease

## 2020-08-24 DIAGNOSIS — I5032 Chronic diastolic (congestive) heart failure: Secondary | ICD-10-CM | POA: Diagnosis not present

## 2020-08-24 DIAGNOSIS — Z794 Long term (current) use of insulin: Secondary | ICD-10-CM | POA: Diagnosis not present

## 2020-08-24 DIAGNOSIS — Z79899 Other long term (current) drug therapy: Secondary | ICD-10-CM | POA: Diagnosis not present

## 2020-08-24 DIAGNOSIS — Z7902 Long term (current) use of antithrombotics/antiplatelets: Secondary | ICD-10-CM | POA: Insufficient documentation

## 2020-08-24 DIAGNOSIS — E669 Obesity, unspecified: Secondary | ICD-10-CM | POA: Diagnosis not present

## 2020-08-24 DIAGNOSIS — F329 Major depressive disorder, single episode, unspecified: Secondary | ICD-10-CM | POA: Insufficient documentation

## 2020-08-24 DIAGNOSIS — I6529 Occlusion and stenosis of unspecified carotid artery: Secondary | ICD-10-CM | POA: Insufficient documentation

## 2020-08-24 DIAGNOSIS — E1151 Type 2 diabetes mellitus with diabetic peripheral angiopathy without gangrene: Secondary | ICD-10-CM | POA: Diagnosis not present

## 2020-08-24 DIAGNOSIS — F17210 Nicotine dependence, cigarettes, uncomplicated: Secondary | ICD-10-CM | POA: Diagnosis not present

## 2020-08-24 DIAGNOSIS — I25110 Atherosclerotic heart disease of native coronary artery with unstable angina pectoris: Secondary | ICD-10-CM

## 2020-08-24 DIAGNOSIS — Z7982 Long term (current) use of aspirin: Secondary | ICD-10-CM | POA: Insufficient documentation

## 2020-08-24 DIAGNOSIS — K760 Fatty (change of) liver, not elsewhere classified: Secondary | ICD-10-CM | POA: Diagnosis not present

## 2020-08-24 DIAGNOSIS — Z951 Presence of aortocoronary bypass graft: Secondary | ICD-10-CM | POA: Insufficient documentation

## 2020-08-24 DIAGNOSIS — I110 Hypertensive heart disease with heart failure: Secondary | ICD-10-CM | POA: Diagnosis not present

## 2020-08-24 DIAGNOSIS — I252 Old myocardial infarction: Secondary | ICD-10-CM | POA: Diagnosis not present

## 2020-08-24 DIAGNOSIS — E785 Hyperlipidemia, unspecified: Secondary | ICD-10-CM | POA: Diagnosis not present

## 2020-08-24 DIAGNOSIS — F4310 Post-traumatic stress disorder, unspecified: Secondary | ICD-10-CM | POA: Insufficient documentation

## 2020-08-24 DIAGNOSIS — Z6829 Body mass index (BMI) 29.0-29.9, adult: Secondary | ICD-10-CM | POA: Insufficient documentation

## 2020-08-24 HISTORY — PX: LEFT HEART CATH AND CORS/GRAFTS ANGIOGRAPHY: CATH118250

## 2020-08-24 LAB — GLUCOSE, CAPILLARY: Glucose-Capillary: 258 mg/dL — ABNORMAL HIGH (ref 70–99)

## 2020-08-24 SURGERY — LEFT HEART CATH AND CORS/GRAFTS ANGIOGRAPHY
Anesthesia: LOCAL

## 2020-08-24 MED ORDER — LIDOCAINE HCL (PF) 1 % IJ SOLN
INTRAMUSCULAR | Status: DC | PRN
  Administered 2020-08-24: 15 mL via INTRADERMAL

## 2020-08-24 MED ORDER — SODIUM CHLORIDE 0.9 % IV SOLN: 250.0000 mL | INTRAVENOUS | Status: DC | PRN

## 2020-08-24 MED ORDER — FENTANYL CITRATE (PF) 100 MCG/2ML IJ SOLN
INTRAMUSCULAR | Status: AC
  Filled 2020-08-24: qty 2

## 2020-08-24 MED ORDER — FENTANYL CITRATE (PF) 100 MCG/2ML IJ SOLN
INTRAMUSCULAR | Status: DC | PRN
Start: 2020-08-24 — End: 2020-08-24
  Administered 2020-08-24: 25 ug via INTRAVENOUS
  Administered 2020-08-24: 50 ug via INTRAVENOUS

## 2020-08-24 MED ORDER — SODIUM CHLORIDE 0.9 % WEIGHT BASED INFUSION
3.0000 mL/kg/h | INTRAVENOUS | Status: AC
  Administered 2020-08-24: 3 mL/kg/h via INTRAVENOUS

## 2020-08-24 MED ORDER — MIDAZOLAM HCL 2 MG/2ML IJ SOLN
INTRAMUSCULAR | Status: AC
  Filled 2020-08-24: qty 2

## 2020-08-24 MED ORDER — ONDANSETRON HCL 4 MG/2ML IJ SOLN: 4.0000 mg | Freq: Four times a day (QID) | INTRAMUSCULAR | Status: DC | PRN

## 2020-08-24 MED ORDER — CLOPIDOGREL BISULFATE 75 MG PO TABS
75.0000 mg | ORAL_TABLET | ORAL | Status: AC
  Administered 2020-08-24: 75 mg via ORAL
  Filled 2020-08-24: qty 1

## 2020-08-24 MED ORDER — ASPIRIN 81 MG PO CHEW: 81.0000 mg | CHEWABLE_TABLET | ORAL | Status: DC

## 2020-08-24 MED ORDER — LIDOCAINE HCL (PF) 1 % IJ SOLN
INTRAMUSCULAR | Status: AC
  Filled 2020-08-24: qty 30

## 2020-08-24 MED ORDER — ACETAMINOPHEN 325 MG PO TABS: 650.0000 mg | ORAL_TABLET | ORAL | Status: DC | PRN

## 2020-08-24 MED ORDER — MIDAZOLAM HCL 2 MG/2ML IJ SOLN
INTRAMUSCULAR | Status: DC | PRN
  Administered 2020-08-24: 1 mg via INTRAVENOUS
  Administered 2020-08-24: 2 mg via INTRAVENOUS

## 2020-08-24 MED ORDER — SODIUM CHLORIDE 0.9 % IV SOLN: INTRAVENOUS | Status: DC

## 2020-08-24 MED ORDER — SODIUM CHLORIDE 0.9% FLUSH: 3.0000 mL | Freq: Two times a day (BID) | INTRAVENOUS | Status: DC

## 2020-08-24 MED ORDER — SODIUM CHLORIDE 0.9 % WEIGHT BASED INFUSION: 1.0000 mL/kg/h | INTRAVENOUS | Status: DC

## 2020-08-24 MED ORDER — IOHEXOL 350 MG/ML SOLN
INTRAVENOUS | Status: AC
  Filled 2020-08-24: qty 1

## 2020-08-24 MED ORDER — LABETALOL HCL 5 MG/ML IV SOLN: 10.0000 mg | INTRAVENOUS | Status: DC | PRN

## 2020-08-24 MED ORDER — IOHEXOL 350 MG/ML SOLN
INTRAVENOUS | Status: DC | PRN
  Administered 2020-08-24: 120 mL

## 2020-08-24 MED ORDER — SODIUM CHLORIDE 0.9% FLUSH: 3.0000 mL | INTRAVENOUS | Status: DC | PRN

## 2020-08-24 MED ORDER — HEPARIN (PORCINE) IN NACL 1000-0.9 UT/500ML-% IV SOLN
INTRAVENOUS | Status: AC
  Filled 2020-08-24: qty 1000

## 2020-08-24 SURGICAL SUPPLY — 10 items
CATH INFINITI 5FR MULTPACK ANG (CATHETERS) ×1 IMPLANT
DEVICE CLOSURE MYNXGRIP 5F (Vascular Products) ×1 IMPLANT
KIT HEART LEFT (KITS) ×2 IMPLANT
KIT MICROPUNCTURE NIT STIFF (SHEATH) ×1 IMPLANT
PACK CARDIAC CATHETERIZATION (CUSTOM PROCEDURE TRAY) ×2 IMPLANT
SHEATH PINNACLE 5F 10CM (SHEATH) ×1 IMPLANT
SHEATH PROBE COVER 6X72 (BAG) ×1 IMPLANT
TRANSDUCER W/STOPCOCK (MISCELLANEOUS) ×2 IMPLANT
TUBING CIL FLEX 10 FLL-RA (TUBING) ×2 IMPLANT
WIRE EMERALD 3MM-J .035X150CM (WIRE) ×1 IMPLANT

## 2020-08-24 NOTE — Progress Notes (Signed)
Up and walked and tolerated well; right groin stable, no bleeding or hematoma 

## 2020-08-24 NOTE — Interval H&P Note (Signed)
Cath Lab Visit (complete for each Cath Lab visit)  Clinical Evaluation Leading to the Procedure:   ACS: Yes.   Unstable angina  Non-ACS:  n/a   History and Physical Interval Note:  08/24/2020 11:31 AM  Erika [name]  has presented today for surgery, with the diagnosis of unstable angina.  The various methods of treatment have been discussed with the patient and family. After consideration of risks, benefits and other options for treatment, the patient has consented to  Procedure(s): LEFT HEART CATH AND CORS/GRAFTS ANGIOGRAPHY ([name]) as a surgical intervention.  The patient's history has been reviewed, patient examined, no change in status, stable for surgery.  I have reviewed the patient's chart and labs.  Questions were answered to the patient's satisfaction.     [name]

## 2020-08-24 NOTE — Discharge Instructions (Signed)
Femoral Site Care This sheet gives you information about how to care for yourself after your procedure. Your health care provider may also give you more specific instructions. If you have problems or questions, contact your health care provider. What can I expect after the procedure? After the procedure, it is common to have:  Bruising that usually fades within 1-2 weeks.  Tenderness at the site. Follow these instructions at home: Wound care  Follow instructions from your health care provider about how to take care of your insertion site. Make sure you: ? Wash your hands with soap and water before you change your bandage (dressing). If soap and water are not available, use hand sanitizer. ? Change your dressing as told by your health care provider. ? Leave stitches (sutures), skin glue, or adhesive strips in place. These skin closures may need to stay in place for 2 weeks or longer. If adhesive strip edges start to loosen and curl up, you may trim the loose edges. Do not remove adhesive strips completely unless your health care provider tells you to do that.  Do not take baths, swim, or use a hot tub until your health care provider approves.  You may shower 24-48 hours after the procedure or as told by your health care provider. ? Gently wash the site with plain soap and water. ? Pat the area dry with a clean towel. ? Do not rub the site. This may cause bleeding.  Do not apply powder or lotion to the site. Keep the site clean and dry.  Check your femoral site every day for signs of infection. Check for: ? Redness, swelling, or pain. ? Fluid or blood. ? Warmth. ? Pus or a bad smell. Activity  For the first 2-3 days after your procedure, or as long as directed: ? Avoid climbing stairs as much as possible. ? Do not squat.  Do not lift anything that is heavier than 10 lb (4.5 kg), or the limit that you are told, until your health care provider says that it is safe.  Rest as  directed. ? Avoid sitting for a long time without moving. Get up to take short walks every 1-2 hours.  Do not drive for 24 hours if you were given a medicine to help you relax (sedative). General instructions  Take over-the-counter and prescription medicines only as told by your health care provider.  Keep all follow-up visits as told by your health care provider. This is important. Contact a health care provider if you have:  A fever or chills.  You have redness, swelling, or pain around your insertion site. Get help right away if:  The catheter insertion area swells very fast.  You pass out.  You suddenly start to sweat or your skin gets clammy.  The catheter insertion area is bleeding, and the bleeding does not stop when you hold steady pressure on the area.  The area near or just beyond the catheter insertion site becomes pale, cool, tingly, or numb. These symptoms may represent a serious problem that is an emergency. Do not wait to see if the symptoms will go away. Get medical help right away. Call your local emergency services (911 in the [place] Do not drive yourself to the hospital. Summary  After the procedure, it is common to have bruising that usually fades within 1-2 weeks.  Check your femoral site every day for signs of infection.  Do not lift anything that is heavier than 10 lb (4.5 kg), or the   limit that you are told, until your health care provider says that it is safe. This information is not intended to replace advice given to you by your health care provider. Make sure you discuss any questions you have with your health care provider. Document Revised: 12/23/2017 Document Reviewed: 12/23/2017 Elsevier Patient Education  2020 [employer]  

## 2020-08-25 ENCOUNTER — Encounter (HOSPITAL_COMMUNITY): Payer: Self-pay | Admitting: Cardiovascular Disease

## 2020-08-31 ENCOUNTER — Telehealth: Payer: Self-pay

## 2020-08-31 DIAGNOSIS — S025XXA Fracture of tooth (traumatic), initial encounter for closed fracture: Secondary | ICD-10-CM

## 2020-08-31 MED ORDER — MAGIC MOUTHWASH: 15.0000 mL | Freq: Three times a day (TID) | ORAL | 1 refills | Status: DC | PRN

## 2020-08-31 NOTE — Telephone Encounter (Signed)
Received a refill request: Magic Mouthwash  rx that was sent on 07/14/20 was never received by [facility] per pharmacist.   Please advise.

## 2020-09-06 ENCOUNTER — Ambulatory Visit (INDEPENDENT_AMBULATORY_CARE_PROVIDER_SITE_OTHER): Payer: Managed Care, Other (non HMO) | Admitting: Cardiovascular Disease

## 2020-09-06 ENCOUNTER — Encounter: Payer: Self-pay | Admitting: Cardiovascular Disease

## 2020-09-06 ENCOUNTER — Other Ambulatory Visit: Payer: Self-pay

## 2020-09-06 ENCOUNTER — Other Ambulatory Visit (HOSPITAL_COMMUNITY): Payer: Self-pay | Admitting: Psychiatry

## 2020-09-06 VITALS — BP 172/100 | HR 82 | Ht 67.0 in | Wt 192.0 lb

## 2020-09-06 DIAGNOSIS — F4310 Post-traumatic stress disorder, unspecified: Secondary | ICD-10-CM

## 2020-09-06 DIAGNOSIS — I739 Peripheral vascular disease, unspecified: Secondary | ICD-10-CM | POA: Diagnosis not present

## 2020-09-06 DIAGNOSIS — E785 Hyperlipidemia, unspecified: Secondary | ICD-10-CM

## 2020-09-06 DIAGNOSIS — I25700 Atherosclerosis of coronary artery bypass graft(s), unspecified, with unstable angina pectoris: Secondary | ICD-10-CM | POA: Diagnosis not present

## 2020-09-06 DIAGNOSIS — I10 Essential (primary) hypertension: Secondary | ICD-10-CM | POA: Diagnosis not present

## 2020-09-06 NOTE — Progress Notes (Signed)
Cardiology Office Note   Date:  09/06/2020   ID:  Erika [name], DOB December 29, 1970, MRN [id number]  PCP:  Libby Maw, MD Cardiologist:   [name], MD   Chief Complaint  Patient presents with  . Follow-up    2 Week follow up and post cath. Medications verbally reviewed with patient.       History of Present Illness: Erika [name] is a 49 y.o. female who presents for a follow-up visit  regarding extensive cardiovascular history. She has known history of coronary artery disease with previous non-ST elevation myocardial infarction in 2017.  She was found to have three-vessel coronary artery disease at that time and underwent CABG and right carotid endarterectomy. She was rehospitalized in September, 2017 with chest pain in the setting of uncontrolled hypertension. Cardiac catheterization showed patent grafts . The LAD had diffuse disease distal to the anastomosis and was very small in caliber. Ejection fraction was normal with mildly elevated left ventricular end-diastolic pressure.   She has history of difficult to control hypertension with labile blood pressure.  No evidence of renal artery stenosis on previous angiography.  She has known history of severe mixed hyperlipidemia .  She is known to have peripheral arterial disease with previous stenting of the left SFA. She was subsequently found to have occluded left SFA.  This year, she had right leg claudication and was found to have an occluded right SFA. She had worsening angina recently manifested by increased back discomfort.  I proceeded with cardiac catheterization which showed progression of native coronary artery disease but patent grafts with no obstructive disease.  Medical therapy was recommended.  She reports improved symptoms overall.  She is limited by claudication more than anything else.  She is trying to quit smoking at the same time with her husband that she is down to one quarter of a pack  daily.  She also continues to lose weight with healthy lifestyle changes.   Past Medical History:  Diagnosis Date  . Arthralgia of temporomandibular joint   . CAD, multiple vessel    a. cath 06/29/16: ostLM 40%, ostLAD 40%, pLAD 95%, ost-pLCx 60%, pLCx 95%, mLCx 60%, mRCA 95%, D2 50%, LVSF nl;  b. 07/2016 CABG x 4 (LIMA->LAD, VG->Diag, VG->OM, VG->RCA); c. 08/2016 Cath: 3VD w/ 4/4 patent grafts. LAD distal to LIMA has diff dzs->Med rx.  . Carotid arterial disease ([place])    a. 07/2016 s/p R CEA.  . Clotting disorder ([place])   . Depression   . Diastolic dysfunction    a. echo 06/28/16: EF 50-55%, mild inf wall HK, GR1DD, mild MR, RV sys fxn nl, mildly dilated LA, PASP nl  . Fatty liver disease, nonalcoholic 5364  . History of blood transfusion    with heart surgery  . HLD (hyperlipidemia)   . Labile hypertension    a. prior renal ngiogram negative for RAS in 03/2016; b. catecholamines and metanephrines normal, mildly elevated renin with normal aldosterone and normal ratio in 02/2016  . Myocardial infarction ([place]) 2017  . Obesity   . PAD (peripheral artery disease) ([facility])    in both legs  . PTSD (post-traumatic stress disorder)   . Tobacco abuse    In 2018 - pt cut back from 2 ppd to 0.5 ppd.- still 1/2 ppd as of 04/22/20  . Type 2 diabetes mellitus [facility]) January 2017    Past Surgical History:  Procedure Laterality Date  . ABDOMINAL AORTOGRAM W/LOWER EXTREMITY [name] 10/15/2018   Procedure:  ABDOMINAL AORTOGRAM W/LOWER EXTREMITY;  Surgeon: Wellington Hampshire, MD;  Location: [place] [facility];  Service: Cardiovascular;  Laterality: [name];  . ABDOMINAL AORTOGRAM W/LOWER EXTREMITY Bilateral 08/19/2019   Procedure: ABDOMINAL AORTOGRAM W/LOWER EXTREMITY;  Surgeon: Wellington Hampshire, MD;  Location: [place] [facility];  Service: Cardiovascular;  Laterality: Bilateral;  . CARDIAC CATHETERIZATION [name] 06/29/2016   Procedure: Left Heart Cath and Coronary Angiography;  Surgeon: Minna Merritts, MD;  Location: [place] [facility];  Service: Cardiovascular;  Laterality: [name];  . CARDIAC CATHETERIZATION [name] 08/29/2016   Procedure: Left Heart Cath and Cors/Grafts Angiography;  Surgeon: Wellington Hampshire, MD;  Location: [place] [facility];  Service: Cardiovascular;  Laterality: [name];  . CESAREAN SECTION    . CHOLECYSTECTOMY    . CORONARY ARTERY BYPASS GRAFT [name] 07/06/2016   Procedure: CORONARY ARTERY BYPASS GRAFTING (CABG) x four, using left internal mammary artery and right leg greater saphenous vein harvested endoscopically;  Surgeon: Ivin Poot, MD;  Location: [place];  Service: Open Heart Surgery;  Laterality: [name];  . ENDARTERECTOMY Right 07/06/2016   Procedure: ENDARTERECTOMY CAROTID;  Surgeon: Rosetta Posner, MD;  Location: [place];  Service: Vascular;  Laterality: Right;  . ENDARTERECTOMY Right 04/27/2020   Procedure: REDO OF RIGHT ENDARTERECTOMY CAROTID;  Surgeon: Rosetta Posner, MD;  Location: [place];  Service: Vascular;  Laterality: Right;  . LEFT HEART CATH AND CORS/GRAFTS ANGIOGRAPHY [name] 08/24/2020   Procedure: LEFT HEART CATH AND CORS/GRAFTS ANGIOGRAPHY;  Surgeon: Wellington Hampshire, MD;  Location: [place] [facility];  Service: Cardiovascular;  Laterality: [name];  . PERIPHERAL VASCULAR CATHETERIZATION [name] 04/18/2016   Procedure: Renal Angiography;  Surgeon: Wellington Hampshire, MD;  Location: [place] [facility];  Service: Cardiovascular;  Laterality: [name];  . PERIPHERAL VASCULAR INTERVENTION Left 10/15/2018   Procedure: PERIPHERAL VASCULAR INTERVENTION;  Surgeon: Wellington Hampshire, MD;  Location: [place] [facility];  Service: Cardiovascular;  Laterality: Left;  Left superficial femoral  . TEE WITHOUT CARDIOVERSION [name] 07/06/2016   Procedure: TRANSESOPHAGEAL ECHOCARDIOGRAM (TEE);  Surgeon: Ivin Poot, MD;  Location: [place];  Service: Open Heart Surgery;  Laterality: [name];  . TONSILLECTOMY       Current Outpatient Medications  Medication Sig Dispense Refill  . albuterol (PROVENTIL HFA;VENTOLIN HFA) 108 (90 Base)  MCG/ACT inhaler Inhale 2 puffs into the lungs every 6 (six) hours as needed for wheezing. 1 Inhaler 0  . amLODipine (NORVASC) 10 MG tablet Take 1 tablet (10 mg total) by mouth daily. (Patient taking differently: Take 10 mg by mouth daily at 6 PM. ) 90 tablet 3  . aspirin EC 81 MG tablet Take 1 tablet (81 mg total) by mouth daily. 90 tablet 3  . benztropine (COGENTIN) 0.5 MG tablet Take 1 tablet (0.5 mg total) by mouth at bedtime. 90 tablet 0  . carvedilol (COREG) 25 MG tablet Take 1 tablet (25 mg total) by mouth 2 (two) times daily with a meal. 60 tablet 11  . chlorproMAZINE (THORAZINE) 100 MG tablet Take 1 tablet (100 mg total) by mouth at bedtime. 30 tablet 0  . clonazePAM (KLONOPIN) 0.5 MG tablet Take 1 tablet (0.5 mg total) by mouth 3 (three) times daily as needed for anxiety. (Patient taking differently: Take 0.5 mg by mouth in the morning, at noon, and at bedtime. ) 90 tablet 2  . cloNIDine (CATAPRES) 0.1 MG tablet Take 0.1 mg by mouth 2 (two) times daily.    . clopidogrel (PLAVIX) 75 MG tablet  TAKE 1 TABLET BY MOUTH EVERY DAY (Patient taking differently: Take 75 mg by mouth daily. ) 30 tablet 3  . dapagliflozin propanediol (FARXIGA) 5 MG TABS tablet Take 5 mg by mouth daily before breakfast. 30 tablet 11  . Evolocumab (REPATHA SURECLICK) 756 MG/ML SOAJ Inject 1 pen into the skin every 14 (fourteen) days. (Patient taking differently: Inject 140 mg into the skin every 14 (fourteen) days. ) 2 pen 11  . FLUoxetine HCl 60 MG TABS Take 60 mg by mouth daily. (Patient taking differently: Take 60 mg by mouth at bedtime. ) 90 tablet 0  . [START ON 09/08/2020] HYDROcodone-acetaminophen (NORCO/VICODIN) 5-325 MG tablet Take 1 tablet by mouth 3 (three) times daily as needed for severe pain. Must last 30 days. (Patient taking differently: Take 1 tablet by mouth in the morning and at bedtime. Must last 30 days.) 90 tablet 0  . insulin glargine, 2 Unit Dial, (TOUJEO MAX SOLOSTAR) 300 UNIT/ML Solostar Pen Inject  110 Units into the skin every morning. (Patient taking differently: Inject 110 Units into the skin in the morning. ) 4 pen 11  . insulin glargine, 2 Unit Dial, (TOUJEO MAX SOLOSTAR) 300 UNIT/ML Solostar Pen Inject 110 Units into the skin daily.     . insulin lispro (HUMALOG KWIKPEN) 100 UNIT/ML KwikPen Inject 0.03 mLs (3 Units total) into the skin 3 (three) times daily with meals. Take only if blood sugar is over 250.  Also pen needles 4/day 15 mL 11  . Insulin Syringe-Needle U-100 29G X 1/2" 0.3 ML MISC 1 each by Does not apply route 2 (two) times daily. 30 each 0  . isosorbide mononitrate (IMDUR) 60 MG 24 hr tablet Tale 1 tablet ($RemoveB'60mg'CurTmOet$ ) in the am and 1/2 tablet ($RemoveBef'30mg'hoVymmKChT$ ) in the pm. (Patient taking differently: Take 60 mg by mouth See admin instructions. Take 1 tablet ($RemoveB'60mg'FxiYHCvf$ ) in the am and 1/2 tablet ($RemoveBef'30mg'scjOcKADYF$ ) in the pm until 08/24/20)    . lamoTRIgine (LAMICTAL) 200 MG tablet Take 1 tablet (200 mg total) by mouth daily. (Patient taking differently: Take 200 mg by mouth at bedtime. ) 90 tablet 0  . losartan (COZAAR) 100 MG tablet Take 1 tablet (100 mg total) by mouth daily. 30 tablet 11  . magic mouthwash SOLN Take 15 mLs by mouth 3 (three) times daily as needed for mouth pain. 250 mL 1  . naloxone (NARCAN) nasal spray 4 mg/0.1 mL 4 mg (contents of 1 nasal spray) as a single dose in one nostril; may repeat every 2 to 3 minutes in alternating nostrils until medical assistance becomes available (Patient taking differently: Place 1 spray into the nose See admin instructions. 4 mg (contents of 1 nasal spray) as a single dose in one nostril; may repeat every 2 to 3 minutes in alternating nostrils until medical assistance becomes available) 1 kit 0  . nicotine (NICODERM CQ - DOSED IN MG/24 HOURS) 14 mg/24hr patch Place 14 mg onto the skin daily.     . nitroGLYCERIN (NITROSTAT) 0.4 MG SL tablet Place 1 tablet (0.4 mg total) under the tongue every 5 (five) minutes as needed for chest pain. 25 tablet 98  . ondansetron  (ZOFRAN) 4 MG tablet Take 1 tablet (4 mg total) by mouth daily as needed. (Patient taking differently: Take 4 mg by mouth daily as needed for nausea or vomiting. ) 20 tablet 11  . spironolactone (ALDACTONE) 25 MG tablet Take 1 tablet (25 mg total) by mouth daily. 90 tablet 3   No current facility-administered medications  for this visit.    Allergies:   Chantix [varenicline tartrate]    Social History:  The patient  reports that she has been smoking cigarettes. She has a 6.75 pack-year smoking history. She has never used smokeless tobacco. She reports previous alcohol use. She reports that she does not use drugs.   Family History:  The patient's family history includes Alcohol abuse in her father; Anxiety disorder in her sister; Diabetes in her father and mother; Drug abuse in her father; Heart disease in her father; Stroke in her sister. She was adopted.    ROS:  Please see the history of present illness.   Otherwise, review of systems are positive for none.   All other systems are reviewed and negative.    PHYSICAL EXAM: VS:  Ht $R'5\' 7"'Kn$  (1.702 m)   Wt 192 lb (87.1 kg)   LMP 08/15/2020 (Approximate)   BMI 30.07 kg/m  , BMI Body mass index is 30.07 kg/m. GEN: Well nourished, well developed, in no acute distress  HEENT: normal  Neck: no JVD, carotid bruits, or masses Cardiac: RRR; no  rubs, or gallops,no edema . There is 1/6 systolic flow murmur in the pulmonic area Respiratory:  clear to auscultation bilaterally, normal work of breathing GI: soft, nontender, nondistended, + BS MS: no deformity or atrophy  Skin: warm and dry, no rash Neuro:  Strength and sensation are intact Psych: euthymic mood, full affect Right groin is intact with no hematoma.  EKG:  EKG is  ordered today. EKG showed normal sinus rhythm with minimal LVH and possible old anterior infarct.   Recent Labs: 09/15/2019: TSH 1.693 04/22/2020: ALT 23 08/17/2020: BUN 13; Creatinine, Ser 0.92; Hemoglobin 16.0; Platelets  381; Potassium 3.7; Sodium 132    Lipid Panel    Component Value Date/Time   CHOL 157 09/07/2019 0846   CHOL 121 08/21/2016 0817   TRIG 191.0 (H) 09/07/2019 0846   HDL 38.70 (L) 09/07/2019 0846   HDL 24 (L) 08/21/2016 0817   CHOLHDL 4 09/07/2019 0846   VLDL 38.2 09/07/2019 0846   LDLCALC 80 09/07/2019 0846   LDLCALC 64 08/21/2016 0817   LDLDIRECT 116.0 06/29/2019 1040      Wt Readings from Last 3 Encounters:  09/06/20 192 lb (87.1 kg)  08/24/20 186 lb (84.4 kg)  08/22/20 186 lb (84.4 kg)         ASSESSMENT AND PLAN:  1.  Coronary artery disease involving native coronary arteries with stable angina: Status post CABG in July of 2017.  Recent cardiac catheterization showed progression of native coronary artery disease but patent grafts.  Continue long-acting nitroglycerin and a beta-blocker.  2. Peripheral arterial disease with severe bilateral calf claudication with bilateral SFA occlusion: I recommend maximizing medical therapy, quitting smoking and a walking exercise program.  Revascularization should be reserved for refractory symptoms.   3. Carotid artery disease status post right carotid endarterectomy. She had recurrent severe disease in the right side and had to undergo redo right carotid endarterectomy by Dr. Donnetta Hutching this year.  4. Essential hypertension:  Blood pressure continues to be significantly elevated in spite of multiple blood pressure medications.  Continue current medications..  She will be a candidate for renal denervation if the therapy becomes approved.  5. Hyperlipidemia: Continue treatment with Repatha. Most recent lipid profile showed an LDL of 80.  6. Tobacco use: She is trying to quit smoking.    Disposition:   FU with me in 6 months   Signed,  Rogue Jury  Fletcher Anon, MD  09/06/2020 1:49 PM    [facility]

## 2020-09-06 NOTE — Patient Instructions (Signed)
Medication Instructions:  Your physician recommends that you continue on your current medications as directed. Please refer to the Current Medication list given to you today.  *If you need a refill on your cardiac medications before your next appointment, please call your pharmacy*   Lab Work: None ordered If you have labs (blood work) drawn today and your tests are completely normal, you will receive your results only by: . MyChart Message (if you have MyChart) OR . A paper copy in the mail If you have any lab test that is abnormal or we need to change your treatment, we will call you to review the results.   Testing/Procedures: None ordered   Follow-Up: At [facility], you and your health needs are our priority.  As part of our continuing mission to provide you with exceptional heart care, we have created designated Provider Care Teams.  These Care Teams include your primary Cardiologist (physician) and Advanced Practice Providers (APPs -  Physician Assistants and Nurse Practitioners) who all work together to provide you with the care you need, when you need it.  We recommend signing up for the patient portal called "MyChart".  Sign up information is provided on this After Visit Summary.  MyChart is used to connect with patients for Virtual Visits (Telemedicine).  Patients are able to view lab/test results, encounter notes, upcoming appointments, etc.  Non-urgent messages can be sent to your provider as well.   To learn more about what you can do with MyChart, go to [email or web address].    Your next appointment:   6 month(s)  The format for your next appointment:   In Person  Provider:    You may see Muhammad Arida, MD or one of the following Advanced Practice Providers on your designated Care Team:    Christopher Berge, NP  Ryan Dunn, PA-C  Jacquelyn Visser, PA-C    Other Instructions [name]  

## 2020-09-19 ENCOUNTER — Telehealth: Payer: Self-pay | Admitting: Family Medicine

## 2020-09-19 NOTE — Telephone Encounter (Signed)
Patient called.  Patient wants to switch from Dr.Kremer to [place].  Patient said our office is closer to her home. Can patient switch?

## 2020-09-19 NOTE — Telephone Encounter (Signed)
[place] with me and OK for back to back.

## 2020-09-19 NOTE — Telephone Encounter (Signed)
Okay with me 

## 2020-09-19 NOTE — Telephone Encounter (Signed)
If it's okay for patient to switch, she scheduled a transfer appointment for her husband with Dr.Cody on 11/21/20 at 3:20 and she wanted to know if she could schedule an appointment the same day at 2:20?

## 2020-09-20 ENCOUNTER — Ambulatory Visit: Payer: Managed Care, Other (non HMO) | Admitting: Cardiovascular Disease

## 2020-09-23 ENCOUNTER — Other Ambulatory Visit: Payer: Self-pay

## 2020-09-23 ENCOUNTER — Other Ambulatory Visit: Payer: Self-pay | Admitting: *Deleted

## 2020-09-23 MED ORDER — INSULIN LISPRO (1 UNIT DIAL) 100 UNIT/ML (KWIKPEN): 3.0000 [IU] | PEN_INJECTOR | Freq: Three times a day (TID) | SUBCUTANEOUS | 1 refills | Status: DC

## 2020-09-26 NOTE — Telephone Encounter (Signed)
Rx was prescribed from historical provider for Clonidine. Please advice if refill is appropriate.

## 2020-09-27 ENCOUNTER — Encounter: Payer: Self-pay | Admitting: Neurology

## 2020-09-27 ENCOUNTER — Other Ambulatory Visit: Payer: Self-pay

## 2020-09-27 ENCOUNTER — Ambulatory Visit (INDEPENDENT_AMBULATORY_CARE_PROVIDER_SITE_OTHER): Payer: Managed Care, Other (non HMO) | Admitting: Neurology

## 2020-09-27 ENCOUNTER — Encounter (INDEPENDENT_AMBULATORY_CARE_PROVIDER_SITE_OTHER): Payer: Managed Care, Other (non HMO) | Admitting: Neurology

## 2020-09-27 DIAGNOSIS — M21372 Foot drop, left foot: Secondary | ICD-10-CM | POA: Diagnosis not present

## 2020-09-27 DIAGNOSIS — R200 Anesthesia of skin: Secondary | ICD-10-CM | POA: Diagnosis not present

## 2020-09-27 DIAGNOSIS — Z0289 Encounter for other administrative examinations: Secondary | ICD-10-CM

## 2020-09-27 DIAGNOSIS — G5631 Lesion of radial nerve, right upper limb: Secondary | ICD-10-CM | POA: Diagnosis not present

## 2020-09-27 DIAGNOSIS — G5702 Lesion of sciatic nerve, left lower limb: Secondary | ICD-10-CM

## 2020-09-27 MED ORDER — CLONIDINE HCL 0.1 MG PO TABS
0.1000 mg | ORAL_TABLET | Freq: Two times a day (BID) | ORAL | 3 refills | Status: DC
Start: 2020-09-27 — End: 2021-01-06
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
## 2020-10-05 ENCOUNTER — Other Ambulatory Visit (HOSPITAL_COMMUNITY): Payer: Self-pay | Admitting: Psychiatry

## 2020-10-05 DIAGNOSIS — F4310 Post-traumatic stress disorder, unspecified: Secondary | ICD-10-CM

## 2020-10-27 ENCOUNTER — Encounter (HOSPITAL_COMMUNITY): Payer: Self-pay

## 2020-10-28 ENCOUNTER — Telehealth (HOSPITAL_COMMUNITY): Payer: Self-pay

## 2020-10-28 NOTE — Telephone Encounter (Signed)
Received call from patient requesting a letter to be excused from jury duty. Approved by provider. Letter completed and signed for doctor per provider

## 2020-11-13 ENCOUNTER — Other Ambulatory Visit (HOSPITAL_COMMUNITY): Payer: Self-pay | Admitting: Psychiatry

## 2020-11-13 DIAGNOSIS — F331 Major depressive disorder, recurrent, moderate: Secondary | ICD-10-CM

## 2020-11-15 ENCOUNTER — Other Ambulatory Visit: Payer: Self-pay

## 2020-11-15 ENCOUNTER — Telehealth (INDEPENDENT_AMBULATORY_CARE_PROVIDER_SITE_OTHER): Payer: 59 | Admitting: Psychiatry

## 2020-11-15 ENCOUNTER — Encounter (HOSPITAL_COMMUNITY): Payer: Self-pay | Admitting: Psychiatry

## 2020-11-15 DIAGNOSIS — F4310 Post-traumatic stress disorder, unspecified: Secondary | ICD-10-CM | POA: Diagnosis not present

## 2020-11-15 DIAGNOSIS — F410 Panic disorder [episodic paroxysmal anxiety] without agoraphobia: Secondary | ICD-10-CM

## 2020-11-15 DIAGNOSIS — F331 Major depressive disorder, recurrent, moderate: Secondary | ICD-10-CM | POA: Diagnosis not present

## 2020-11-15 MED ORDER — FLUOXETINE HCL 60 MG PO TABS: 60.0000 mg | ORAL_TABLET | Freq: Every day | ORAL | 0 refills | Status: DC

## 2020-11-15 MED ORDER — BENZTROPINE MESYLATE 0.5 MG PO TABS: 0.5000 mg | ORAL_TABLET | Freq: Every day | ORAL | 0 refills | Status: DC

## 2020-11-15 MED ORDER — CLONAZEPAM 0.5 MG PO TABS: 0.5000 mg | ORAL_TABLET | Freq: Three times a day (TID) | ORAL | 2 refills | Status: DC | PRN

## 2020-11-15 MED ORDER — LAMOTRIGINE 200 MG PO TABS: 200.0000 mg | ORAL_TABLET | Freq: Every day | ORAL | 0 refills | Status: DC

## 2020-11-15 MED ORDER — CHLORPROMAZINE HCL 100 MG PO TABS: ORAL_TABLET | ORAL | 0 refills | Status: DC
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
## 2020-11-21 ENCOUNTER — Encounter: Payer: Self-pay | Admitting: Family Medicine

## 2020-11-21 ENCOUNTER — Ambulatory Visit (INDEPENDENT_AMBULATORY_CARE_PROVIDER_SITE_OTHER): Payer: Managed Care, Other (non HMO) | Admitting: Family Medicine

## 2020-11-21 ENCOUNTER — Other Ambulatory Visit: Payer: Self-pay

## 2020-11-21 VITALS — BP 160/90 | HR 82 | Temp 98.2°F | Ht 67.25 in | Wt 196.5 lb

## 2020-11-21 DIAGNOSIS — E0865 Diabetes mellitus due to underlying condition with hyperglycemia: Secondary | ICD-10-CM

## 2020-11-21 DIAGNOSIS — Z794 Long term (current) use of insulin: Secondary | ICD-10-CM | POA: Diagnosis not present

## 2020-11-21 DIAGNOSIS — E0859 Diabetes mellitus due to underlying condition with other circulatory complications: Secondary | ICD-10-CM

## 2020-11-21 DIAGNOSIS — R634 Abnormal weight loss: Secondary | ICD-10-CM | POA: Diagnosis not present

## 2020-11-21 DIAGNOSIS — E785 Hyperlipidemia, unspecified: Secondary | ICD-10-CM | POA: Diagnosis not present

## 2020-11-21 DIAGNOSIS — Z720 Tobacco use: Secondary | ICD-10-CM

## 2020-11-21 DIAGNOSIS — I10 Essential (primary) hypertension: Secondary | ICD-10-CM | POA: Diagnosis not present

## 2020-11-21 DIAGNOSIS — D72829 Elevated white blood cell count, unspecified: Secondary | ICD-10-CM

## 2020-11-21 DIAGNOSIS — F332 Major depressive disorder, recurrent severe without psychotic features: Secondary | ICD-10-CM

## 2020-11-21 DIAGNOSIS — IMO0002 Reserved for concepts with insufficient information to code with codable children: Secondary | ICD-10-CM

## 2020-11-21 DIAGNOSIS — G894 Chronic pain syndrome: Secondary | ICD-10-CM

## 2020-11-21 NOTE — Assessment & Plan Note (Signed)
Reviewed prior weights. Weight is down ~20-30 lbs from 2 years ago. Which is less than she thought and seems to have been stable over the last year. Will recheck some blood work including TSH which has not been done recently.

## 2020-11-21 NOTE — Assessment & Plan Note (Signed)
Appreciate cardiology support. Cont simvastatin 20 mg and evolocumab.

## 2020-11-21 NOTE — Assessment & Plan Note (Signed)
Working on cessation. Continued to encourage

## 2020-11-21 NOTE — Assessment & Plan Note (Addendum)
Overdue for appt with Dr. Loanne Drilling. Taking Insulin 2 units, Trujeo 110 units, and farxiga. Foot exam normal. Will repeat lipids and hgb a1c. Encouraged f/u with endo

## 2020-11-21 NOTE — Progress Notes (Signed)
Subjective:     Erika [name] is a 49 y.o. female presenting for Establish Care (no concerns )     HPI  Lab Results  Component Value Date   HGBA1C 9.5 (A) 04/11/2020   Follows with Dr. Loanne Drilling Notes she is losing weight - unintentional 250 lbs in Jan 2021 -- has not done anything for weight loss No changes to diabetes CBG 150 is typical but will range from 85-500  No eye exam recently   Sees psych  - clonazepam, thorazine, lamictal, and benztropine, fluoxetine - taking clonazepam three times a day regularly  - the panic episodes mimic her MI symptoms    Review of Systems   Social History   Tobacco Use  Smoking Status Current Every Day Smoker  . Packs/day: 0.25  . Years: 27.00  . Pack years: 6.75  . Types: Cigarettes  Smokeless Tobacco Never Used  Tobacco Comment   Smokes 3 cigarettes daily         Objective:    BP Readings from Last 3 Encounters:  11/21/20 (!) 160/90  09/06/20 (!) 172/100  08/24/20 (!) 152/86   Wt Readings from Last 3 Encounters:  11/21/20 196 lb 8 oz (89.1 kg)  09/06/20 192 lb (87.1 kg)  08/24/20 186 lb (84.4 kg)    BP (!) 160/90   Pulse 82   Temp 98.2 F (36.8 C) (Temporal)   Ht 5' 7.25" (1.708 m)   Wt 196 lb 8 oz (89.1 kg)   LMP 11/16/2020 (Exact Date)   SpO2 95%   BMI 30.55 kg/m    Physical Exam Constitutional:      General: She is not in acute distress.    Appearance: She is well-developed. She is not diaphoretic.  HENT:     Right Ear: External ear normal.     Left Ear: External ear normal.     Nose: Nose normal.  Eyes:     Conjunctiva/sclera: Conjunctivae normal.  Cardiovascular:     Rate and Rhythm: Normal rate and regular rhythm.     Heart sounds: No murmur heard.   Pulmonary:     Effort: Pulmonary effort is normal. No respiratory distress.     Breath sounds: Normal breath sounds. No wheezing.  Musculoskeletal:     Cervical back: Neck supple.  Skin:    General: Skin is warm and dry.      Capillary Refill: Capillary refill takes less than 2 seconds.  Neurological:     Mental Status: She is alert. Mental status is at baseline.  Psychiatric:        Mood and Affect: Mood normal.        Behavior: Behavior normal.           Assessment & Plan:   Problem List Items Addressed This Visit      Cardiovascular and Mediastinum   Essential hypertension, malignant - Primary (Chronic)    Continues to be adherent to medication but uncontrolled. Sees Cardiology - taking clonidine 0.1 mg BID, amlodipine 10 mg, carvedilol 25 mg BID, spironolactone 25 mg, and Imdur 60 mg.       Relevant Medications   simvastatin (ZOCOR) 20 MG tablet   Other Relevant Orders   Comprehensive metabolic panel   CBC with Differential     Endocrine   Diabetes mellitus due to underlying condition, uncontrolled, with circulatory complication, with long-term current use of insulin ([place])    Overdue for appt with Dr. Loanne Drilling. Taking Insulin 2 units, Trujeo 110 units,  and farxiga. Foot exam normal. Will repeat lipids and hgb a1c. Encouraged f/u with endo      Relevant Medications   simvastatin (ZOCOR) 20 MG tablet   Other Relevant Orders   Hemoglobin A1c     Other   Chronic pain syndrome (Chronic)    Follows with Dr. Consuela Mimes with pain management and getting injections and hydrocodone-acetaminophen but reports she did better on oxycodone. She has a f/u visit and I advised she discuss her side effects with Dr. Consuela Mimes. Appreciate pain management support.       HLD (hyperlipidemia)    Appreciate cardiology support. Cont simvastatin 20 mg and evolocumab.       Relevant Medications   simvastatin (ZOCOR) 20 MG tablet   Other Relevant Orders   Lipid panel   MDD (major depressive disorder)    PHQ-9 elevated. She follows with psychiatry and feels her medications are stable. Taking Benzos TID. Discussed this is one of many high risk medications. Appreciate psych support. Cont fluoxetine 60 mg, clonazepam 0.5  mg TID, benztropine 0.5 mg at bedtime, and lamotrigine 200 mg      Tobacco abuse    Working on cessation. Continued to encourage      Leukocytosis    Reviewed Hematology visit from 09/2019 normal work-up suspected reactive 2/2 to smoking. Pt notes weight loss, though not as much as she previously thought. Will repeat today.       Unintentional weight loss    Reviewed prior weights. Weight is down ~20-30 lbs from 2 years ago. Which is less than she thought and seems to have been stable over the last year. Will recheck some blood work including TSH which has not been done recently.       Relevant Orders   TSH       Return in about 6 months (around 05/21/2021).  Lesleigh Noe, MD  This visit occurred during the SARS-CoV-2 public health emergency.  Safety protocols were in place, including screening questions prior to the visit, additional usage of staff PPE, and extensive cleaning of exam room while observing appropriate contact time as indicated for disinfecting solutions.

## 2020-11-21 NOTE — Assessment & Plan Note (Addendum)
Continues to be adherent to medication but uncontrolled. Sees Cardiology - taking clonidine 0.1 mg BID, amlodipine 10 mg, carvedilol 25 mg BID, spironolactone 25 mg, and Imdur 60 mg.

## 2020-11-21 NOTE — Patient Instructions (Addendum)
Blood work for weight loss

## 2020-11-21 NOTE — Assessment & Plan Note (Signed)
Follows with Dr. Consuela Mimes with pain management and getting injections and hydrocodone-acetaminophen but reports she did better on oxycodone. She has a f/u visit and I advised she discuss her side effects with Dr. Consuela Mimes. Appreciate pain management support.

## 2020-11-21 NOTE — Assessment & Plan Note (Signed)
PHQ-9 elevated. She follows with psychiatry and feels her medications are stable. Taking Benzos TID. Discussed this is one of many high risk medications. Appreciate psych support. Cont fluoxetine 60 mg, clonazepam 0.5 mg TID, benztropine 0.5 mg at bedtime, and lamotrigine 200 mg

## 2020-11-21 NOTE — Assessment & Plan Note (Signed)
Reviewed Hematology visit from 09/2019 normal work-up suspected reactive 2/2 to smoking. Pt notes weight loss, though not as much as she previously thought. Will repeat today.

## 2020-11-22 LAB — CBC WITH DIFFERENTIAL/PLATELET
Basophils Absolute: 0.1 10*3/uL (ref 0.0–0.1)
Basophils Relative: 1.2 % (ref 0.0–3.0)
Eosinophils Absolute: 0.2 10*3/uL (ref 0.0–0.7)
Eosinophils Relative: 1.9 % (ref 0.0–5.0)
HCT: 46.5 % — ABNORMAL HIGH (ref 36.0–46.0)
Hemoglobin: 15.5 g/dL — ABNORMAL HIGH (ref 12.0–15.0)
Lymphocytes Relative: 26.6 % (ref 12.0–46.0)
Lymphs Abs: 2.9 10*3/uL (ref 0.7–4.0)
MCHC: 33.4 g/dL (ref 30.0–36.0)
MCV: 89.9 fl (ref 78.0–100.0)
Monocytes Absolute: 0.5 10*3/uL (ref 0.1–1.0)
Monocytes Relative: 4.7 % (ref 3.0–12.0)
Neutro Abs: 7.1 10*3/uL (ref 1.4–7.7)
Neutrophils Relative %: 65.6 % (ref 43.0–77.0)
Platelets: 404 10*3/uL — ABNORMAL HIGH (ref 150.0–400.0)
RBC: 5.18 Mil/uL — ABNORMAL HIGH (ref 3.87–5.11)
RDW: 13.8 % (ref 11.5–15.5)
WBC: 10.8 10*3/uL — ABNORMAL HIGH (ref 4.0–10.5)

## 2020-11-22 LAB — COMPREHENSIVE METABOLIC PANEL
ALT: 36 U/L — ABNORMAL HIGH (ref 0–35)
AST: 16 U/L (ref 0–37)
Albumin: 4.1 g/dL (ref 3.5–5.2)
Alkaline Phosphatase: 96 U/L (ref 39–117)
BUN: 6 mg/dL (ref 6–23)
CO2: 32 mEq/L (ref 19–32)
Calcium: 10.7 mg/dL — ABNORMAL HIGH (ref 8.4–10.5)
Chloride: 97 mEq/L (ref 96–112)
Creatinine, Ser: 0.63 mg/dL (ref 0.40–1.20)
GFR: 105.97 mL/min (ref >60.00)
Glucose, Bld: 295 mg/dL — ABNORMAL HIGH (ref 70–99)
Potassium: 4.7 mEq/L (ref 3.5–5.1)
Sodium: 135 mEq/L (ref 135–145)
Total Bilirubin: 0.3 mg/dL (ref 0.2–1.2)
Total Protein: 6.9 g/dL (ref 6.0–8.3)

## 2020-11-22 LAB — LIPID PANEL
Cholesterol: 214 mg/dL — ABNORMAL HIGH (ref 0–200)
HDL: 42.8 mg/dL (ref >39.00)
Total CHOL/HDL Ratio: 5
Triglycerides: 488 mg/dL — ABNORMAL HIGH (ref 0.0–149.0)

## 2020-11-22 LAB — LDL CHOLESTEROL, DIRECT: Direct LDL: 112 mg/dL

## 2020-11-22 LAB — HEMOGLOBIN A1C: Hgb A1c MFr Bld: 10.3 % — ABNORMAL HIGH (ref 4.6–6.5)

## 2020-11-22 LAB — TSH: TSH: 1.26 u[IU]/mL (ref 0.35–4.50)

## 2020-11-23 ENCOUNTER — Encounter: Payer: Self-pay | Admitting: Pain Medicine

## 2020-11-23 ENCOUNTER — Other Ambulatory Visit: Payer: Self-pay

## 2020-11-23 ENCOUNTER — Ambulatory Visit: Payer: Managed Care, Other (non HMO) | Attending: Pain Medicine | Admitting: Pain Medicine

## 2020-11-23 VITALS — BP 150/95 | HR 90 | Temp 98.4°F | Resp 18 | Ht 67.0 in | Wt 196.0 lb

## 2020-11-23 DIAGNOSIS — R519 Headache, unspecified: Secondary | ICD-10-CM

## 2020-11-23 DIAGNOSIS — M7602 Gluteal tendinitis, left hip: Secondary | ICD-10-CM | POA: Diagnosis present

## 2020-11-23 DIAGNOSIS — Z79899 Other long term (current) drug therapy: Secondary | ICD-10-CM | POA: Insufficient documentation

## 2020-11-23 DIAGNOSIS — F1120 Opioid dependence, uncomplicated: Secondary | ICD-10-CM | POA: Insufficient documentation

## 2020-11-23 DIAGNOSIS — M67952 Unspecified disorder of synovium and tendon, left thigh: Secondary | ICD-10-CM | POA: Insufficient documentation

## 2020-11-23 DIAGNOSIS — G894 Chronic pain syndrome: Secondary | ICD-10-CM | POA: Diagnosis present

## 2020-11-23 DIAGNOSIS — R6884 Jaw pain: Secondary | ICD-10-CM

## 2020-11-23 DIAGNOSIS — M1612 Unilateral primary osteoarthritis, left hip: Secondary | ICD-10-CM | POA: Diagnosis present

## 2020-11-23 DIAGNOSIS — G8929 Other chronic pain: Secondary | ICD-10-CM | POA: Diagnosis present

## 2020-11-23 DIAGNOSIS — B0221 Postherpetic geniculate ganglionitis: Secondary | ICD-10-CM | POA: Insufficient documentation

## 2020-11-23 DIAGNOSIS — M25552 Pain in left hip: Secondary | ICD-10-CM

## 2020-11-23 DIAGNOSIS — G629 Polyneuropathy, unspecified: Secondary | ICD-10-CM

## 2020-11-23 MED ORDER — OXYCODONE HCL 5 MG PO TABS: 5.0000 mg | ORAL_TABLET | Freq: Two times a day (BID) | ORAL | 0 refills | Status: DC | PRN

## 2020-11-23 MED ORDER — PREGABALIN 150 MG PO CAPS: ORAL_CAPSULE | ORAL | 0 refills | Status: DC

## 2020-11-23 MED ORDER — PREGABALIN 225 MG PO CAPS: 225.0000 mg | ORAL_CAPSULE | Freq: Two times a day (BID) | ORAL | 2 refills | Status: DC

## 2020-11-23 NOTE — Progress Notes (Signed)
Nursing Pain Medication Assessment:  Safety precautions to be maintained throughout the outpatient stay will include: orient to surroundings, keep bed in low position, maintain call bell within reach at all times, provide assistance with transfer out of bed and ambulation.  Medication Inspection Compliance: Pill count conducted under aseptic conditions, in front of the patient. Neither the pills nor the bottle was removed from the patient's sight at any time. Once count was completed pills were immediately returned to the patient in their original bottle.  Medication: Hydrocodone/APAP Pill/Patch Count: 28 of 90 pills remain Pill/Patch Appearance: Markings consistent with prescribed medication Bottle Appearance: Standard pharmacy container. Clearly labeled. Filled Date: 53 / 18 / 2021 Last Medication intake:  Yesterday

## 2020-11-23 NOTE — Progress Notes (Signed)
PROVIDER NOTE: Information contained herein reflects review and annotations entered in association with encounter. Interpretation of such information and data should be left to medically-trained personnel. Information provided to patient can be located elsewhere in the medical record under "Patient Instructions". Document created using STT-dictation technology, any transcriptional errors that may result from process are unintentional.    Patient: Erika [name]  Service Category: E/M  Provider: Gaspar Cola, MD  DOB: 12-20-1971  DOS: 11/23/2020  Specialty: Interventional Pain Management  MRN: [id number]  Setting: Ambulatory outpatient  PCP: Lesleigh Noe, MD  Type: Established Patient    Referring Provider: Libby Maw,*  Location: Office  Delivery: Face-to-face     HPI  [name], a 49 y.o. year old female, is here today because of her Chronic pain syndrome [G89.4]. Erika [name] primary complain today is Hip Pain (left) and Headache Last encounter: My last encounter with her was on 08/17/2020. Pertinent problems: Erika [name] has Cluster headache; Chest pain with high risk for cardiac etiology; Other chronic pain; Bilateral hip pain; Chronic ankle pain (Bilateral); Tendinopathy of right gluteus medius; Tendinopathy of gluteus medius (Left); PVD (peripheral vascular disease) ([place]); Chronic pain syndrome; Chronic headaches (1ry area of Pain) (Right); Neurogenic pain; Atypical facial pain (Right); Chronic ear pain (Right); Chronic jaw pain (Right); Geniculate Neuralgia (Right); History of carotid endarterectomy (Right); Right hip pain; Neuropathy of right radial nerve; Radial nerve palsy (Right); Chronic hip pain (Left); Osteoarthritis of hip (Left); Gluteal tendonitis of buttock (Left); Chronic migraine; Neuropathy; and Foot drop, left on their pertinent problem list. Pain Assessment: Severity of   is reported as a 4 /10. Location: Hip Left/denies. Onset: More than  a month ago. Quality: Aching, Constant, Sharp. Timing: Constant. Modifying factor(s): medications, elevation, positioning, stretching, heat. Vitals:  height is _0  (1.702 m) and weight is 196 lb (88.9 kg). Her oral temperature is 98.4 F (36.9 C). Her blood pressure is 150/95 (abnormal) and her pulse is 90. Her respiration is 18 and oxygen saturation is 96%.   Reason for encounter: medication management.  The patient indicates doing well with the current medication regimen. No adverse reactions or side effects reported to the medications. PMP & UDS compliant. The patient indicates that she spoke to her new primary care physician and they indicated that they would not be writing for the Lyrica. I have suggested that she either talk to her neurologist Dr. Haig Prophet or consider getting another PCP. The patient indicates that while she was taking the Lyrica she was getting good relief of the pain from her neuropathy. The patient came to me on Lyrica 225 mg p.o. 3 times daily, which we continued for a while and confirmed that she was doing okay on that. On the patient's last visit I informed her that we would be transferring all nonopioids to the primary care physician, but I fully intended to give her a prescription for Lyrica at the time so asked to keep her until she could be seen in the medication taken over. However, because the patient apparently still had some medicine left at the time, I did not end up doing that refill. Later on she ran out of the medicine she was unable to get a hold of me since apparently I was out on vacation. I have apologized for this and today I have provided her with a prescription where we will again be titrating the medication up slowly. However, I have informed the patient that the usual maximum  dose on the medication is 450 mg/day and this may be the reason why other people feel uncomfortable prescribing it. To solve this problem, what we will do is we will increase her dose  up to the 450 mg and I will let the patient's neurologist decide whether or not he wants to increase the dose any further. She has indicated that she thinks there would be no problems with him taking over the medicine. This is a perfect example why I have decided to transfer all nonopioids out of my practice so as to be able to concentrate on the opioid analgesic and comply with all of the regulations. In addition, this should also give [name] time to concentrate more on the interventional portion of the practice. In addition to this, the patient has indicated that her left hip pain seems to be beginning to come back and she was wondering if she can have another injection done. I will go ahead and put an order for a PRN left hip injection and I have reminded the patient that when she feels she needs the injection she then needs to call and schedule it but she also have to remember to stop her Plavix for 7 days prior to the procedure. She understood and accepted.  Regarding the patient's medication, because of the oxycodone national shortage, I had switched her to hydrocodone. She indicates that she is unable to tolerate this due to nausea and vomiting. She also indicated having spoken to her pharmacist who told her that he would make sure that she can have her medicine. She would like to go back to the oxycodone. I have no problems with this but I have also given the patient a warning that I will not be resending prescriptions to any other pharmacy should they not have an adequate supply at her pharmacy. She understood and accepted.  Pharmacotherapy Assessment   Analgesic: Oxycodone IR 5 mg. 1 tab PO BID (10 mg/day of oxycodone) MME/day: 15 mg/day.   Monitoring: [place] PMP: PDMP reviewed during this encounter.       Pharmacotherapy: No side-effects or adverse reactions reported. Compliance: No problems identified. Effectiveness: Clinically acceptable.  [name], RN  11/23/2020  1:24 PM  Sign when  Signing Visit 11/23/2020 at 1322                               [place] #28 Hydrocodone in stericycle with patient and Stephan Minister, RN as witnesses.  [name], RN  11/23/2020 12:18 PM  Sign when Signing Visit Nursing Pain Medication Assessment:  Safety precautions to be maintained throughout the outpatient stay will include: orient to surroundings, keep bed in low position, maintain call bell within reach at all times, provide assistance with transfer out of bed and ambulation.  Medication Inspection Compliance: Pill count conducted under aseptic conditions, in front of the patient. Neither the pills nor the bottle was removed from the patient's sight at any time. Once count was completed pills were immediately returned to the patient in their original bottle.  Medication: Hydrocodone/APAP Pill/Patch Count: 28 of 90 pills remain Pill/Patch Appearance: Markings consistent with prescribed medication Bottle Appearance: Standard pharmacy container. Clearly labeled. Filled Date: 83 / 18 / 2021 Last Medication intake:  Yesterday    UDS:  Summary  Date Value Ref Range Status  06/23/2020 Note  Corrected    Comment:    ==================================================================== [employer] 13 (MW) ==================================================================== Test  Result       Flag       Units  Drug Present and Declared for Prescription Verification   Clonazepam                     28           EXPECTED   ng/mg creat   7-aminoclonazepam              303          EXPECTED   ng/mg creat    Source of clonazepam is a scheduled prescription medication. 7-    aminoclonazepam is an expected metabolite of clonazepam.  Drug Absent but Declared for Prescription Verification   Oxycodone                      Not Detected UNEXPECTED ng/mg creat ==================================================================== Test                      Result     Flag   Units      Ref Range   Creatinine              105              mg/dL      >=20 ==================================================================== Declared Medications:  The flagging and interpretation on this report are based on the  following declared medications.  Unexpected results may arise from  inaccuracies in the declared medications.   **Note: The testing scope of this panel includes these medications:   Clonazepam (Klonopin)  Oxycodone (Roxicodone)   **Note: The testing scope of this panel does not include the  following reported medications:   Albuterol (Ventolin HFA)  Amlodipine (Norvasc)  Amoxicillin (Amoxil)  Aspirin  Benztropine (Cogentin)  Carvedilol (Coreg)  Chlorpromazine  Clonidine (Catapres)  Clopidogrel (Plavix)  Dapagliflozin (Farxiga)  Evolocumab  Fluoxetine  Insulin (Toujeo)  Isosorbide (Imdur)  Lamotrigine (Lamictal)  Losartan (Cozaar)  Mouthwash  Naloxone (Narcan)  Nicotine  Nitroglycerin (Nitrostat)  Ondansetron (Zofran)  Pregabalin (Lyrica)  Spironolactone (Aldactone)  Vitamin D3 ==================================================================== For clinical consultation, please call [phone]. ====================================================================      ROS  Constitutional: Denies any fever or chills Gastrointestinal: No reported hemesis, hematochezia, vomiting, or acute GI distress Musculoskeletal: Denies any acute onset joint swelling, redness, loss of ROM, or weakness Neurological: No reported episodes of acute onset apraxia, aphasia, dysarthria, agnosia, amnesia, paralysis, loss of coordination, or loss of consciousness  Medication Review  Evolocumab, FLUoxetine HCl, Insulin Syringe-Needle U-100, albuterol, amLODipine, aspirin EC, benztropine, carvedilol, chlorproMAZINE, cloNIDine, clonazePAM, clopidogrel, dapagliflozin propanediol, insulin glargine (2 Unit Dial), insulin lispro, isosorbide mononitrate,  lamoTRIgine, losartan, naloxone, nicotine, nitroGLYCERIN, ondansetron, oxyCODONE, pregabalin, simvastatin, and spironolactone  History Review  Allergy: Erika [name] is allergic to chantix [varenicline tartrate]. Drug: Erika [name]  reports no history of drug use. Alcohol:  reports previous alcohol use. Tobacco:  reports that she has been smoking cigarettes. She has a 6.75 pack-year smoking history. She has never used smokeless tobacco. Social: Ms. Muzio  reports that she has been smoking cigarettes. She has a 6.75 pack-year smoking history. She has never used smokeless tobacco. She reports previous alcohol use. She reports that she does not use drugs. Medical:  has a past medical history of Arthralgia of temporomandibular joint, CAD, multiple vessel, Carotid arterial disease ([place]), Clotting disorder ([place]), Depression, Diastolic dysfunction, Fatty liver disease, nonalcoholic (4580), History of blood transfusion, HLD (hyperlipidemia), Labile hypertension, Myocardial infarction ([place]) (  2017), Obesity, PAD (peripheral artery disease) ([place]), PTSD (post-traumatic stress disorder), Tobacco abuse, and Type 2 diabetes mellitus [facility]) (January 2017). Surgical: Ms. Locatelli  has a past surgical history that includes Cholecystectomy; Tonsillectomy; Cesarean section; Cardiac catheterization ([name], 04/18/2016); Cardiac catheterization ([name], 06/29/2016); Coronary artery bypass graft ([name], 07/06/2016); TEE without cardioversion ([name], 07/06/2016); Endarterectomy (Right, 07/06/2016); Cardiac catheterization ([name], 08/29/2016); ABDOMINAL AORTOGRAM W/LOWER EXTREMITY ([name], 10/15/2018); PERIPHERAL VASCULAR INTERVENTION (Left, 10/15/2018); ABDOMINAL AORTOGRAM W/LOWER EXTREMITY (Bilateral, 08/19/2019); Endarterectomy (Right, 04/27/2020); and LEFT HEART CATH AND CORS/GRAFTS ANGIOGRAPHY ([name], 08/24/2020). Family: family history includes Alcohol abuse in her father; Anxiety disorder in her sister; Diabetes in her father and mother; Drug abuse  in her father; Heart disease in her father; Stroke in her sister. She was adopted.  Laboratory Chemistry Profile   Renal Lab Results  Component Value Date   BUN 6 11/21/2020   CREATININE 0.63 11/21/2020   LABCREA 64.9 03/04/2019   BCR 12 08/13/2019   GFR 105.97 11/21/2020   GFRAA >60 08/17/2020   GFRNONAA >60 08/17/2020   LABVMA 1.5 03/04/2019   LABVMA 2.3 03/04/2019     Hepatic Lab Results  Component Value Date   AST 16 11/21/2020   ALT 36 (H) 11/21/2020   ALBUMIN 4.1 11/21/2020   ALKPHOS 96 11/21/2020   HCVAB <0.1 09/15/2019   LIPASE 34 05/28/2009     Electrolytes Lab Results  Component Value Date   NA 135 11/21/2020   K 4.7 11/21/2020   CL 97 11/21/2020   CALCIUM 10.7 (H) 11/21/2020   MG 2.0 01/07/2019   PHOS 3.1 05/19/2017     Bone Lab Results  Component Value Date   VD25OH 14.27 (L) 01/18/2020   25OHVITD1 16 (L) 01/07/2019   25OHVITD2 <1.0 01/07/2019   25OHVITD3 15 01/07/2019     Inflammation (CRP: Acute Phase) (ESR: Chronic Phase) Lab Results  Component Value Date   CRP 15 (H) 01/07/2019   ESRSEDRATE 17 03/17/2019   LATICACIDVEN 2.3 ([place]) 05/19/2017       Note: Above Lab results reviewed.  Recent Imaging Review  CARDIAC CATHETERIZATION  Ost Cx to Prox Cx lesion is 100% stenosed.  Ost LAD to Prox LAD lesion is 85% stenosed.  Mid LAD lesion is 100% stenosed.  The left ventricular systolic function is normal.  LV end diastolic pressure is mildly elevated.  The left ventricular ejection fraction is 55-65% by visual estimate.  Prox RCA lesion is 95% stenosed.  Mid RCA lesion is 100% stenosed.  SVG graft was visualized by angiography and is normal in caliber.  The graft exhibits no disease.  SVG and is normal in caliber.  The graft exhibits no disease.  2nd Diag lesion is 95% stenosed.  SVG graft was visualized by angiography and is normal in caliber.  The graft exhibits no disease.  LIMA and is normal in caliber.  The graft  exhibits no disease.  Dist LAD-1 lesion is 60% stenosed.  Dist LAD-2 lesion is 40% stenosed.   1.  Severe underlying three-vessel coronary artery disease with patent  grafts including LIMA to LAD, sequential vein graft to second diagonal/  second OM and SVG to right PDA.  The LAD distal to the LIMA anastomosis is  relatively small in size and diffusely diseased.  No significant change  from before. 2.  Normal LV systolic function mildly elevated left ventricular  end-diastolic pressure.  Recommendations: The patient has severe progression of native coronary artery disease.   However, all grafts are patent.  Recommend continuing medical  therapy. Note: Reviewed        Physical Exam  General appearance: Well nourished, well developed, and well hydrated. In no apparent acute distress Mental status: Alert, oriented x 3 (person, place, & time)       Respiratory: No evidence of acute respiratory distress Eyes: PERLA Vitals: BP (!) 150/95   Pulse 90   Temp 98.4 F (36.9 C) (Oral)   Resp 18   Ht _0  (1.702 m)   Wt 196 lb (88.9 kg)   LMP 11/16/2020 (Exact Date)   SpO2 96%   BMI 30.70 kg/m  BMI: Estimated body mass index is 30.7 kg/m as calculated from the following:   Height as of this encounter: _1  (1.702 m).   Weight as of this encounter: 196 lb (88.9 kg). Ideal: Ideal body weight: 61.6 kg (135 lb 12.9 oz) Adjusted ideal body weight: 72.5 kg (159 lb 14.1 oz)  Assessment   Status Diagnosis  Controlled Controlled Controlled 1. Chronic pain syndrome   2. Chronic hip pain (Left)   3. Gluteal tendonitis of buttock (Left)   4. Chronic headaches (1ry area of Pain) (Right)   5. Chronic jaw pain (Right)   6. Geniculate Neuralgia (Right)   7. Osteoarthritis of hip (Left)   8. Tendinopathy of gluteus medius (Left)   9. Pharmacologic therapy   10. Uncomplicated opioid dependence ([place])   11. Neuropathy      Updated Problems: No problems updated.  Plan of Care   Problem-specific:  No problem-specific Assessment & Plan notes found for this encounter.  Erika [name] has a current medication list which includes the following long-term medication(s): albuterol, amlodipine, benztropine, carvedilol, chlorpromazine, clonazepam, clonidine, fluoxetine hcl, toujeo max solostar, toujeo max solostar, insulin lispro, isosorbide mononitrate, lamotrigine, losartan, nitroglycerin, oxycodone, [START ON 12/23/2020] oxycodone, [START ON 01/22/2021] oxycodone, pregabalin, [START ON 12/13/2020] pregabalin, and spironolactone.  Pharmacotherapy (Medications Ordered): Meds ordered this encounter  Medications  . oxyCODONE (OXY IR/ROXICODONE) 5 MG immediate release tablet    Sig: Take 1 tablet (5 mg total) by mouth 2 (two) times daily as needed for severe pain. Must last 30 days.    Dispense:  60 tablet    Refill:  0    [place] STOP ACT - Not applicable to Chronic Pain Syndrome (G89.4) diagnosis. Fill one day early if pharmacy is closed on scheduled refill date. Do not fill until: 03/04/19. To last until: 04/03/19.  Marland Kitchen oxyCODONE (OXY IR/ROXICODONE) 5 MG immediate release tablet    Sig: Take 1 tablet (5 mg total) by mouth 2 (two) times daily as needed for severe pain. Must last 30 days.    Dispense:  60 tablet    Refill:  0    [place] STOP ACT - Not applicable to Chronic Pain Syndrome (G89.4) diagnosis. Fill one day early if pharmacy is closed on scheduled refill date. Do not fill until: 03/04/19. To last until: 04/03/19.  Marland Kitchen oxyCODONE (OXY IR/ROXICODONE) 5 MG immediate release tablet    Sig: Take 1 tablet (5 mg total) by mouth 2 (two) times daily as needed for severe pain. Must last 30 days.    Dispense:  60 tablet    Refill:  0    [place] STOP ACT - Not applicable to Chronic Pain Syndrome (G89.4) diagnosis. Fill one day early if pharmacy is closed on scheduled refill date. Do not fill until: 03/04/19. To last until: 04/03/19.  Marland Kitchen pregabalin (LYRICA) 150 MG capsule    Sig: Take 1  capsule (150  mg total) by mouth daily for 7 days, THEN 1 capsule (150 mg total) 2 (two) times daily for 7 days, THEN 1 capsule (150 mg total) 3 (three) times daily for 7 days.    Dispense:  42 capsule    Refill:  0    Fill one day early if pharmacy is closed on scheduled refill date. May substitute for generic if available.  . pregabalin (LYRICA) 225 MG capsule    Sig: Take 1 capsule (225 mg total) by mouth 2 (two) times daily.    Dispense:  60 capsule    Refill:  2    Fill one day early if pharmacy is closed on scheduled refill date. May substitute for generic if available.   Orders:  Orders Placed This Encounter  Procedures  . HIP INJECTION    Standing Status:   Standing    Number of Occurrences:   1    Standing Expiration Date:   01/24/2021    Scheduling Instructions:     Side: Left-sided     Sedation: Patient's choice.     Timeframe: PRN   Follow-up plan:   Return in about 3 months (around 02/21/2021) for (F2F), (Med Mgmt), in addition PRN Procedure: (L) Hip, (Blood Thinner Protocol).      Interventional management options: Considering:   NOTE: PLAVIX ANTICOAGULATION(Stop:7-10 days pre-procedure; Restart:2 hours post-proc.) Diagnostic right facial nerve block Diagnostic right geniculate nerve block  Diagnostic right occipital nerve block  Possible right occipital nerveRFA Diagnostic Botox injections   Palliative PRN treatment(s):   Therapeutic left peri-insertional gluteus medius tendon injection #2   Therapeutic left IA hip joint injection #2  Therapeutic/palliative Facial (CN VII) nerve block  #4  Therapeutic/palliative right facial/Geniculatenerve block #3     Recent Visits No visits were found meeting these conditions. Showing recent visits within past 90 days and meeting all other requirements Today's Visits Date Type Provider Dept  11/23/20 Office Visit Milinda Pointer, MD [facility]  Showing today's visits and meeting all other  requirements Future Appointments Date Type Provider Dept  02/20/21 Appointment Milinda Pointer, MD [facility]  Showing future appointments within next 90 days and meeting all other requirements  I discussed the assessment and treatment plan with the patient. The patient was provided an opportunity to ask questions and all were answered. The patient agreed with the plan and demonstrated an understanding of the instructions.  Patient advised to call back or seek an in-person evaluation if the symptoms or condition worsens.  Duration of encounter: 30 minutes.  Note by: Gaspar Cola, MD Date: 11/23/2020; Time: 1:52 PM

## 2020-11-23 NOTE — Progress Notes (Signed)
11/23/2020 at [place] #28 Hydrocodone in stericycle with patient and Stephan Minister, RN as witnesses.

## 2020-11-23 NOTE — Patient Instructions (Signed)
____________________________________________________________________________________________  Preparing for Procedure with Sedation  Procedure appointments are limited to planned procedures: . No Prescription Refills. . No disability issues will be discussed. . No medication changes will be discussed.  Instructions: . Oral Intake: Do not eat or drink anything for at least 8 hours prior to your procedure. (Exception: Blood Pressure Medication. See below.) . Transportation: Unless otherwise stated by your physician, you may drive yourself after the procedure. . Blood Pressure Medicine: Do not forget to take your blood pressure medicine with a sip of water the morning of the procedure. If your Diastolic (lower reading)is above 100 mmHg, elective cases will be cancelled/rescheduled. . Blood thinners: These will need to be stopped for procedures. Notify our staff if you are taking any blood thinners. Depending on which one you take, there will be specific instructions on how and when to stop it. . Diabetics on insulin: Notify the staff so that you can be scheduled 1st case in the morning. If your diabetes requires high dose insulin, take only  of your normal insulin dose the morning of the procedure and notify the staff that you have done so. . Preventing infections: Shower with an antibacterial soap the morning of your procedure. . Build-up your immune system: Take 1000 mg of Vitamin C with every meal (3 times a day) the day prior to your procedure. . Antibiotics: Inform the staff if you have a condition or reason that requires you to take antibiotics before dental procedures. . Pregnancy: If you are pregnant, call and cancel the procedure. . Sickness: If you have a cold, fever, or any active infections, call and cancel the procedure. . Arrival: You must be in the facility at least 30 minutes prior to your scheduled procedure. . Children: Do not bring children with you. . Dress appropriately:  Bring dark clothing that you would not mind if they get stained. . Valuables: Do not bring any jewelry or valuables.  Reasons to call and reschedule or cancel your procedure: (Following these recommendations will minimize the risk of a serious complication.) . Surgeries: Avoid having procedures within 2 weeks of any surgery. (Avoid for 2 weeks before or after any surgery). . Flu Shots: Avoid having procedures within 2 weeks of a flu shots or . (Avoid for 2 weeks before or after immunizations). . Barium: Avoid having a procedure within 7-10 days after having had a radiological study involving the use of radiological contrast. (Myelograms, Barium swallow or enema study). . Heart attacks: Avoid any elective procedures or surgeries for the initial 6 months after a "Myocardial Infarction" (Heart Attack). . Blood thinners: It is imperative that you stop these medications before procedures. Let us know if you if you take any blood thinner.  . Infection: Avoid procedures during or within two weeks of an infection (including chest colds or gastrointestinal problems). Symptoms associated with infections include: Localized redness, fever, chills, night sweats or profuse sweating, burning sensation when voiding, cough, congestion, stuffiness, runny nose, sore throat, diarrhea, nausea, vomiting, cold or Flu symptoms, recent or current infections. It is specially important if the infection is over the area that we intend to treat. . Heart and lung problems: Symptoms that may suggest an active cardiopulmonary problem include: cough, chest pain, breathing difficulties or shortness of breath, dizziness, ankle swelling, uncontrolled high or unusually low blood pressure, and/or palpitations. If you are experiencing any of these symptoms, cancel your procedure and contact your primary care physician for an evaluation.  Remember:  Regular Business hours are:    Monday to Thursday 8:00 AM to 4:00 PM  Provider's  Schedule: Advith Martine, MD:  Procedure days: Tuesday and Thursday 7:30 AM to 4:00 PM  Bilal Lateef, MD:  Procedure days: Monday and Wednesday 7:30 AM to 4:00 PM ____________________________________________________________________________________________   ____________________________________________________________________________________________  Blood Thinners  IMPORTANT NOTICE:  If you take any of these, make sure to notify the nursing staff.  Failure to do so may result in injury.  Recommended time intervals to stop and restart blood-thinners, before & after invasive procedures  Generic Name Brand Name Stop Time. Must be stopped at least this long before procedures. After procedures, wait at least this long before re-starting.  Abciximab Reopro 15 days 2 hrs  Alteplase Activase 10 days 10 days  Anagrelide Agrylin    Apixaban Eliquis 3 days 6 hrs  Cilostazol Pletal 3 days 5 hrs  Clopidogrel Plavix 7-10 days 2 hrs  Dabigatran Pradaxa 5 days 6 hrs  Dalteparin Fragmin 24 hours 4 hrs  Dipyridamole Aggrenox 11days 2 hrs  Edoxaban Lixiana; Savaysa 3 days 2 hrs  Enoxaparin  Lovenox 24 hours 4 hrs  Eptifibatide Integrillin 8 hours 2 hrs  Fondaparinux  Arixtra 72 hours 12 hrs  Prasugrel Effient 7-10 days 6 hrs  Reteplase Retavase 10 days 10 days  Rivaroxaban Xarelto 3 days 6 hrs  Ticagrelor Brilinta 5-7 days 6 hrs  Ticlopidine Ticlid 10-14 days 2 hrs  Tinzaparin Innohep 24 hours 4 hrs  Tirofiban Aggrastat 8 hours 2 hrs  Warfarin Coumadin 5 days 2 hrs   Other medications with blood-thinning effects  Product indications Generic (Brand) names Note  Cholesterol Lipitor Stop 4 days before procedure  Blood thinner (injectable) Heparin (LMW or LMWH Heparin) Stop 24 hours before procedure  Cancer Ibrutinib (Imbruvica) Stop 7 days before procedure  Malaria/Rheumatoid Hydroxychloroquine (Plaquenil) Stop 11 days before procedure  Thrombolytics  10 days before or after procedures    Over-the-counter (OTC) Products with blood-thinning effects  Product Common names Stop Time  Aspirin > 325 mg Goody Powders, Excedrin, etc. 11 days  Aspirin ? 81 mg  7 days  Fish oil  4 days  Garlic supplements  7 days  Ginkgo biloba  36 hours  Ginseng  24 hours  NSAIDs Ibuprofen, Naprosyn, etc. 3 days  Vitamin E  4 days   ____________________________________________________________________________________________   

## 2020-12-07 ENCOUNTER — Other Ambulatory Visit: Payer: Self-pay | Admitting: Cardiovascular Disease

## 2020-12-10 ENCOUNTER — Other Ambulatory Visit: Payer: Self-pay | Admitting: Cardiovascular Disease

## 2020-12-15 ENCOUNTER — Other Ambulatory Visit: Payer: Self-pay | Admitting: Endocrinology

## 2020-12-20 ENCOUNTER — Other Ambulatory Visit: Payer: Self-pay

## 2020-12-20 MED ORDER — CARVEDILOL 25 MG PO TABS
25.0000 mg | ORAL_TABLET | Freq: Two times a day (BID) | ORAL | 11 refills | Status: DC
Start: 2020-12-20 — End: 2021-01-06

## 2021-01-02 ENCOUNTER — Other Ambulatory Visit: Payer: Self-pay

## 2021-01-02 ENCOUNTER — Ambulatory Visit: Payer: Managed Care, Other (non HMO) | Admitting: Family Medicine

## 2021-01-02 ENCOUNTER — Encounter: Payer: Self-pay | Admitting: Family Medicine

## 2021-01-02 DIAGNOSIS — H1032 Unspecified acute conjunctivitis, left eye: Secondary | ICD-10-CM | POA: Diagnosis not present

## 2021-01-02 MED ORDER — TOBRAMYCIN-DEXAMETHASONE 0.3-0.1 % OP SUSP: 2.0000 [drp] | OPHTHALMIC | 0 refills | Status: DC

## 2021-01-02 NOTE — Progress Notes (Signed)
Subjective:    Patient ID: Erika [name], female    DOB: [birth year], 50 y.o.   MRN: [id number]  This visit occurred during the SARS-CoV-2 public health emergency.  Safety protocols were in place, including screening questions prior to the visit, additional usage of staff PPE, and extensive cleaning of exam room while observing appropriate contact time as indicated for disinfecting solutions.    HPI 50 yo pt of Dr Einar Pheasant presents with an eye complaint   Wt Readings from Last 3 Encounters:  01/02/21 201 lb 3 oz (91.3 kg)  11/23/20 196 lb (88.9 kg)  11/21/20 196 lb 8 oz (89.1 kg)    31.51 kg/m  L eye is red and scratchy feeling  Thought at first she had a lash in it  Then became red /painful  Worried about pink eye  Not around little kids , no known exposures   Some discharge- yellow/ green discharge  Eyes crust in am and has to use a warm compress Visine -causes it to burn  Light sensitive   No cold symptoms   BP Readings from Last 3 Encounters:  01/02/21 (!) 168/91  11/23/20 (!) 150/95  11/21/20 (!) 160/90   Has a h/o malignant HTN and vascular dz  She has pain -makes bp worse   Patient Active Problem List   Diagnosis Date Noted  . Acute conjunctivitis of left eye 01/02/2021  . Unintentional weight loss 11/21/2020  . Broken teeth (Right) 08/02/2020  . History of MI (myocardial infarction) (July 2017) 08/02/2020  . Neuropathy 06/09/2020  . Dental abscess 06/09/2020  . Foot drop, left 06/09/2020  . Carotid stenosis, asymptomatic, right 04/27/2020  . Chronic migraine 11/03/2019  . Pyelonephritis due to Escherichia coli 10/26/2019  . Thrombocytosis 09/16/2019  . Erythrocytosis 09/16/2019  . Hypercalcemia 09/06/2019  . Leukocytosis 09/06/2019  . Polycythemia 09/06/2019  . Chronic hip pain (Left) 08/27/2019  . Osteoarthritis of hip (Left) 08/27/2019  . Gluteal tendonitis of buttock (Left) 08/27/2019  . Radial nerve palsy (Right) 07/15/2019  . Neuropathy of  right radial nerve 06/30/2019  . Abnormal bruising 06/25/2019  . Right hip pain 05/28/2019  . History of carotid endarterectomy (Right) 03/04/2019  . Pain medication agreement signed 03/04/2019  . Atypical facial pain (Right) 01/27/2019  . Chronic ear pain (Right) 01/27/2019  . Chronic jaw pain (Right) 01/27/2019  . Geniculate Neuralgia (Right) 01/27/2019  . Vitamin D deficiency 01/19/2019  . Neurogenic pain 01/19/2019  . Chronic anticoagulation (PLAVIX) 01/19/2019  . Chronic pain syndrome 01/07/2019  . Long term current use of opiate analgesic 01/07/2019  . Long term prescription benzodiazepine use 01/07/2019  . Pharmacologic therapy 01/07/2019  . Disorder of skeletal system 01/07/2019  . Problems influencing health status 01/07/2019  . Chronic headaches (1ry area of Pain) (Right) 01/07/2019  . Opiate use 09/24/2018  . PVD (peripheral vascular disease) ([place]) 06/24/2018  . Chronic ankle pain (Bilateral) 12/27/2017  . Tendinopathy of right gluteus medius 12/27/2017  . Tendinopathy of gluteus medius (Left) 12/27/2017  . Bilateral hip pain 12/26/2017  . Other chronic pain 10/17/2017  . Elevated troponin I level 05/21/2017  . Major depressive disorder, recurrent episode, moderate ([place]) 11/19/2016  . Insomnia 10/30/2016  . Constipation 07/25/2016  . S/P CABG x 4 07/06/2016  . Bradycardia   . CAD (coronary artery disease)   . Carotid stenosis   . CAD in native artery 06/29/2016  . Elevated troponin 06/28/2016  . Essential hypertension, malignant 06/28/2016  . Tobacco abuse 06/28/2016  .  Essential hypertension   . Malignant hypertension   . Diabetes mellitus due to underlying condition, uncontrolled, with circulatory complication, with long-term current use of insulin ([place])   . Chest pain with high risk for cardiac etiology 06/27/2016  . NSTEMI (non-ST elevated myocardial infarction) ([place]) 06/27/2016  . Proteinuria 03/15/2016  . Renal artery stenosis ([place]) 03/15/2016  . MDD  (major depressive disorder) 10/17/2015  . Agoraphobia with panic attacks 04/25/2015  . HTN (hypertension), malignant 10/20/2013  . Cluster headache 03/20/2012  . HLD (hyperlipidemia) 07/26/2010  . ADJUSTMENT DISORDER WITH MIXED FEATURES 07/26/2010   Past Medical History:  Diagnosis Date  . Arthralgia of temporomandibular joint   . CAD, multiple vessel    a. cath 06/29/16: ostLM 40%, ostLAD 40%, pLAD 95%, ost-pLCx 60%, pLCx 95%, mLCx 60%, mRCA 95%, D2 50%, LVSF nl;  b. 07/2016 CABG x 4 (LIMA->LAD, VG->Diag, VG->OM, VG->RCA); c. 08/2016 Cath: 3VD w/ 4/4 patent grafts. LAD distal to LIMA has diff dzs->Med rx.  . Carotid arterial disease ([place])    a. 07/2016 s/p R CEA.  . Clotting disorder ([place])   . Depression   . Diastolic dysfunction    a. echo 06/28/16: EF 50-55%, mild inf wall HK, GR1DD, mild MR, RV sys fxn nl, mildly dilated LA, PASP nl  . Fatty liver disease, nonalcoholic 8413  . History of blood transfusion    with heart surgery  . HLD (hyperlipidemia)   . Labile hypertension    a. prior renal ngiogram negative for RAS in 03/2016; b. catecholamines and metanephrines normal, mildly elevated renin with normal aldosterone and normal ratio in 02/2016  . Myocardial infarction ([place]) 2017  . Obesity   . PAD (peripheral artery disease) ([facility])    in both legs  . PTSD (post-traumatic stress disorder)   . Tobacco abuse    In 2018 - pt cut back from 2 ppd to 0.5 ppd.- still 1/2 ppd as of 04/22/20  . Type 2 diabetes mellitus [facility]) January 2017   Past Surgical History:  Procedure Laterality Date  . ABDOMINAL AORTOGRAM W/LOWER EXTREMITY [name] 10/15/2018   Procedure: ABDOMINAL AORTOGRAM W/LOWER EXTREMITY;  Surgeon: Wellington Hampshire, MD;  Location: [place] [facility];  Service: Cardiovascular;  Laterality: [name];  . ABDOMINAL AORTOGRAM W/LOWER EXTREMITY Bilateral 08/19/2019   Procedure: ABDOMINAL AORTOGRAM W/LOWER EXTREMITY;  Surgeon: Wellington Hampshire, MD;  Location: [place] [facility];  Service:  Cardiovascular;  Laterality: Bilateral;  . CARDIAC CATHETERIZATION [name] 06/29/2016   Procedure: Left Heart Cath and Coronary Angiography;  Surgeon: Minna Merritts, MD;  Location: [place] [facility];  Service: Cardiovascular;  Laterality: [name];  . CARDIAC CATHETERIZATION [name] 08/29/2016   Procedure: Left Heart Cath and Cors/Grafts Angiography;  Surgeon: Wellington Hampshire, MD;  Location: [place] [facility];  Service: Cardiovascular;  Laterality: [name];  . CESAREAN SECTION    . CHOLECYSTECTOMY    . CORONARY ARTERY BYPASS GRAFT [name] 07/06/2016   Procedure: CORONARY ARTERY BYPASS GRAFTING (CABG) x four, using left internal mammary artery and right leg greater saphenous vein harvested endoscopically;  Surgeon: Ivin Poot, MD;  Location: [place];  Service: Open Heart Surgery;  Laterality: [name];  . ENDARTERECTOMY Right 07/06/2016   Procedure: ENDARTERECTOMY CAROTID;  Surgeon: Rosetta Posner, MD;  Location: [place];  Service: Vascular;  Laterality: Right;  . ENDARTERECTOMY Right 04/27/2020   Procedure: REDO OF RIGHT ENDARTERECTOMY CAROTID;  Surgeon: Rosetta Posner, MD;  Location: [place];  Service: Vascular;  Laterality: Right;  . LEFT  HEART CATH AND CORS/GRAFTS ANGIOGRAPHY [name] 08/24/2020   Procedure: LEFT HEART CATH AND CORS/GRAFTS ANGIOGRAPHY;  Surgeon: Wellington Hampshire, MD;  Location: [place] [facility];  Service: Cardiovascular;  Laterality: [name];  . PERIPHERAL VASCULAR CATHETERIZATION [name] 04/18/2016   Procedure: Renal Angiography;  Surgeon: Wellington Hampshire, MD;  Location: [place] [facility];  Service: Cardiovascular;  Laterality: [name];  . PERIPHERAL VASCULAR INTERVENTION Left 10/15/2018   Procedure: PERIPHERAL VASCULAR INTERVENTION;  Surgeon: Wellington Hampshire, MD;  Location: [place] [facility];  Service: Cardiovascular;  Laterality: Left;  Left superficial femoral  . TEE WITHOUT CARDIOVERSION [name] 07/06/2016   Procedure: TRANSESOPHAGEAL ECHOCARDIOGRAM (TEE);  Surgeon: Ivin Poot, MD;  Location: [place];  Service:  Open Heart Surgery;  Laterality: [name];  . TONSILLECTOMY     Social History   Tobacco Use  . Smoking status: Current Every Day Smoker    Packs/day: 0.50    Years: 27.00    Pack years: 13.50    Types: Cigarettes  . Smokeless tobacco: Never Used  Vaping Use  . Vaping Use: Former  Substance Use Topics  . Alcohol use: Not Currently    Alcohol/week: 0.0 standard drinks    Comment: socially  . Drug use: No   Family History  Adopted: Yes  Problem Relation Age of Onset  . Diabetes Mother   . Diabetes Father   . Alcohol abuse Father   . Heart disease Father   . Drug abuse Father   . Stroke Sister   . Anxiety disorder Sister    Allergies  Allergen Reactions  . Chantix [Varenicline Tartrate] Other (See Comments)    Feels "crazy" and angry    Current Outpatient Medications on File Prior to Visit  Medication Sig Dispense Refill  . albuterol (PROVENTIL HFA;VENTOLIN HFA) 108 (90 Base) MCG/ACT inhaler Inhale 2 puffs into the lungs every 6 (six) hours as needed for wheezing. 1 Inhaler 0  . aspirin EC 81 MG tablet Take 1 tablet (81 mg total) by mouth daily. 90 tablet 3  . benztropine (COGENTIN) 0.5 MG tablet Take 1 tablet (0.5 mg total) by mouth at bedtime. 90 tablet 0  . carvedilol (COREG) 25 MG tablet Take 1 tablet (25 mg total) by mouth 2 (two) times daily with a meal. 60 tablet 11  . chlorproMAZINE (THORAZINE) 100 MG tablet TAKE 1 TABLET BY MOUTH EVERYDAY AT BEDTIME 90 tablet 0  . clonazePAM (KLONOPIN) 0.5 MG tablet Take 1 tablet (0.5 mg total) by mouth 3 (three) times daily as needed for anxiety. 90 tablet 2  . cloNIDine (CATAPRES) 0.1 MG tablet Take 1 tablet (0.1 mg total) by mouth 2 (two) times daily. 60 tablet 3  . clopidogrel (PLAVIX) 75 MG tablet TAKE 1 TABLET BY MOUTH EVERY DAY (Patient taking differently: Take 75 mg by mouth daily.) 30 tablet 3  . FARXIGA 5 MG TABS tablet TAKE 5 MG BY MOUTH DAILY BEFORE BREAKFAST. 30 tablet 0  . FLUoxetine HCl 60 MG TABS Take 60 mg by mouth  daily. 90 tablet 0  . insulin glargine, 2 Unit Dial, (TOUJEO MAX SOLOSTAR) 300 UNIT/ML Solostar Pen Inject 110 Units into the skin every morning. (Patient taking differently: Inject 110 Units into the skin in the morning.) 4 pen 11  . insulin glargine, 2 Unit Dial, (TOUJEO MAX SOLOSTAR) 300 UNIT/ML Solostar Pen Inject 110 Units into the skin daily.     . insulin lispro (HUMALOG KWIKPEN) 100 UNIT/ML KwikPen Inject 3 Units into the skin 3 (three)  times daily with meals. Take only if blood sugar is over 250.  Also pen needles 4/day 15 mL 1  . Insulin Syringe-Needle U-100 29G X 1/2" 0.3 ML MISC 1 each by Does not apply route 2 (two) times daily. 30 each 0  . isosorbide mononitrate (IMDUR) 60 MG 24 hr tablet Tale 1 tablet ($RemoveB'60mg'SQCkmDHw$ ) in the am and 1/2 tablet ($RemoveBef'30mg'WOxzaCtjAT$ ) in the pm. (Patient taking differently: Take 60 mg by mouth See admin instructions. Take 1 tablet ($RemoveB'60mg'tJPqzMSc$ ) in the am and 1/2 tablet ($RemoveBef'30mg'LlAqCnMQqH$ ) in the pm until 08/24/20)    . lamoTRIgine (LAMICTAL) 200 MG tablet Take 1 tablet (200 mg total) by mouth at bedtime. 90 tablet 0  . losartan (COZAAR) 100 MG tablet Take 1 tablet (100 mg total) by mouth daily. 30 tablet 11  . naloxone (NARCAN) nasal spray 4 mg/0.1 mL 4 mg (contents of 1 nasal spray) as a single dose in one nostril; may repeat every 2 to 3 minutes in alternating nostrils until medical assistance becomes available (Patient taking differently: Place 1 spray into the nose See admin instructions. 4 mg (contents of 1 nasal spray) as a single dose in one nostril; may repeat every 2 to 3 minutes in alternating nostrils until medical assistance becomes available) 1 kit 0  . nicotine (NICODERM CQ - DOSED IN MG/24 HOURS) 14 mg/24hr patch Place 14 mg onto the skin daily.     . nitroGLYCERIN (NITROSTAT) 0.4 MG SL tablet Place 1 tablet (0.4 mg total) under the tongue every 5 (five) minutes as needed for chest pain. 25 tablet 98  . ondansetron (ZOFRAN) 4 MG tablet Take 1 tablet (4 mg total) by mouth daily as needed.  (Patient taking differently: Take 4 mg by mouth daily as needed for nausea or vomiting.) 20 tablet 11  . oxyCODONE (OXY IR/ROXICODONE) 5 MG immediate release tablet Take 1 tablet (5 mg total) by mouth 2 (two) times daily as needed for severe pain. Must last 30 days. 60 tablet 0  . [START ON 01/22/2021] oxyCODONE (OXY IR/ROXICODONE) 5 MG immediate release tablet Take 1 tablet (5 mg total) by mouth 2 (two) times daily as needed for severe pain. Must last 30 days. 60 tablet 0  . pregabalin (LYRICA) 225 MG capsule Take 1 capsule (225 mg total) by mouth 2 (two) times daily. 60 capsule 2  . REPATHA SURECLICK 952 MG/ML SOAJ INJECT 1 PEN INTO THE SKIN EVERY 14 (FOURTEEN) DAYS. 2 mL 3  . simvastatin (ZOCOR) 20 MG tablet Take 20 mg by mouth daily.    [place] Kitchen spironolactone (ALDACTONE) 25 MG tablet Take 1 tablet (25 mg total) by mouth daily. 90 tablet 3  . amLODipine (NORVASC) 10 MG tablet Take 1 tablet (10 mg total) by mouth daily. (Patient taking differently: Take 10 mg by mouth daily at 6 PM. ) 90 tablet 3  . oxyCODONE (OXY IR/ROXICODONE) 5 MG immediate release tablet Take 1 tablet (5 mg total) by mouth 2 (two) times daily as needed for severe pain. Must last 30 days. 60 tablet 0  . pregabalin (LYRICA) 150 MG capsule Take 1 capsule (150 mg total) by mouth daily for 7 days, THEN 1 capsule (150 mg total) 2 (two) times daily for 7 days, THEN 1 capsule (150 mg total) 3 (three) times daily for 7 days. 42 capsule 0   No current facility-administered medications on file prior to visit.     Review of Systems  HENT: Negative for rhinorrhea and sinus pain.   Eyes: Positive for  photophobia, pain, discharge, redness and itching. Negative for visual disturbance.  Cardiovascular:       Poor perfusion to extremities   Musculoskeletal: Positive for arthralgias and back pain.  Skin: Negative for rash.  Neurological: Negative for facial asymmetry.       Objective:   Physical Exam Constitutional:      Appearance: Normal  appearance. She is obese. She is not ill-appearing.  Eyes:     General: No scleral icterus.       Left eye: Discharge present.    Pupils: Pupils are equal, round, and reactive to light.     Comments: Conjunctival injection in L eye (worse medially) with scant yellow d/c and some green crust on lashes   No skin changes   Cardiovascular:     Rate and Rhythm: Regular rhythm.  Pulmonary:     Effort: Pulmonary effort is normal. No respiratory distress.     Breath sounds: Normal breath sounds. No wheezing.  Musculoskeletal:     Cervical back: Neck supple. No tenderness.  Lymphadenopathy:     Cervical: No cervical adenopathy.  Skin:    General: Skin is warm and dry.     Findings: No erythema or rash.  Neurological:     Mental Status: She is alert.     Cranial Nerves: No cranial nerve deficit.  Psychiatric:        Mood and Affect: Mood normal.           Assessment & Plan:   Problem List Items Addressed This Visit      Other   Acute conjunctivitis of left eye    Unilateral with discharge/discomfort and photo sensitivity  Suspect viral vs bacterial  tx with tobradex (inst to use in both eyes if this spreads to her other eye)  Adv against using visine (saline would be better) Disc need for frequent washing of hands and linens  Update if not starting to improve in a week or if worsening  (at any time)  Handout given

## 2021-01-02 NOTE — Assessment & Plan Note (Addendum)
Unilateral with discharge/discomfort and photo sensitivity  Suspect viral vs bacterial  tx with tobradex (inst to use in both eyes if this spreads to her other eye)  Adv against using visine (saline would be better) Disc need for frequent washing of hands and linens  Update if not starting to improve in a week or if worsening  (at any time)  Handout given

## 2021-01-02 NOTE — Patient Instructions (Addendum)
Wash hands/linens/towels and glasses  Don't use visine Use the tobradex drops 2 every 4 hours while awake   Update if not starting to improve in a week or if worsening  Also if any new symptoms or vision change

## 2021-01-06 ENCOUNTER — Other Ambulatory Visit: Payer: Self-pay | Admitting: *Deleted

## 2021-01-06 ENCOUNTER — Telehealth: Payer: Self-pay | Admitting: Cardiovascular Disease

## 2021-01-06 MED ORDER — SIMVASTATIN 20 MG PO TABS: 20.0000 mg | ORAL_TABLET | Freq: Every day | ORAL | 0 refills | Status: DC

## 2021-01-06 MED ORDER — CARVEDILOL 25 MG PO TABS
25.0000 mg | ORAL_TABLET | Freq: Two times a day (BID) | ORAL | 0 refills | Status: DC
Start: 2021-01-06 — End: 2021-01-12

## 2021-01-06 MED ORDER — LOSARTAN POTASSIUM 100 MG PO TABS: 100.0000 mg | ORAL_TABLET | Freq: Every day | ORAL | 0 refills | Status: DC

## 2021-01-06 MED ORDER — AMLODIPINE BESYLATE 10 MG PO TABS: 10.0000 mg | ORAL_TABLET | Freq: Every day | ORAL | 0 refills | Status: DC

## 2021-01-06 MED ORDER — SPIRONOLACTONE 25 MG PO TABS: 25.0000 mg | ORAL_TABLET | Freq: Every day | ORAL | 0 refills | Status: DC

## 2021-01-06 MED ORDER — ISOSORBIDE MONONITRATE ER 60 MG PO TB24: ORAL_TABLET | ORAL | 0 refills | Status: DC

## 2021-01-06 MED ORDER — CLONIDINE HCL 0.1 MG PO TABS
0.1000 mg | ORAL_TABLET | Freq: Two times a day (BID) | ORAL | 0 refills | Status: DC
Start: 2021-01-06 — End: 2021-06-21

## 2021-01-06 NOTE — Telephone Encounter (Signed)
*  STAT* If patient is at the pharmacy, call can be transferred to refill team.   1. Which medications need to be refilled? (please list name of each medication and dose if known)   All cardiac meds expect plavix   2. Which pharmacy/location (including street and city if local pharmacy) is medication to be sent to?  [facility] [place]   3. Do they need a 30 day or 90 day supply? [place]

## 2021-01-06 NOTE — Telephone Encounter (Signed)
All cardiac medications have been refilled.

## 2021-01-06 NOTE — Telephone Encounter (Signed)
Scheduled for 1-20

## 2021-01-06 NOTE — Telephone Encounter (Signed)
Patient due for follow up in March. Ok to refill for 90 days with no refills. Please route to scheduling to schedule patient for follow up appointment.

## 2021-01-06 NOTE — Telephone Encounter (Signed)
Unable to refill at this time. Chart being used.

## 2021-01-06 NOTE — Telephone Encounter (Signed)
Medications pending refill FWD to triage to advise if ok to refill cardiac medications last filled by PCP.

## 2021-01-06 NOTE — Telephone Encounter (Signed)
Please contact pt for future appointment. Pt due in 02/2021.

## 2021-01-12 ENCOUNTER — Other Ambulatory Visit: Payer: Self-pay

## 2021-01-12 ENCOUNTER — Ambulatory Visit (INDEPENDENT_AMBULATORY_CARE_PROVIDER_SITE_OTHER): Payer: Managed Care, Other (non HMO) | Admitting: Physician Assistant

## 2021-01-12 ENCOUNTER — Encounter: Payer: Self-pay | Admitting: Physician Assistant

## 2021-01-12 VITALS — BP 144/98 | HR 105 | Ht 67.0 in | Wt 197.0 lb

## 2021-01-12 DIAGNOSIS — E0865 Diabetes mellitus due to underlying condition with hyperglycemia: Secondary | ICD-10-CM

## 2021-01-12 DIAGNOSIS — Z720 Tobacco use: Secondary | ICD-10-CM

## 2021-01-12 DIAGNOSIS — I739 Peripheral vascular disease, unspecified: Secondary | ICD-10-CM | POA: Diagnosis not present

## 2021-01-12 DIAGNOSIS — E782 Mixed hyperlipidemia: Secondary | ICD-10-CM

## 2021-01-12 DIAGNOSIS — I6529 Occlusion and stenosis of unspecified carotid artery: Secondary | ICD-10-CM

## 2021-01-12 DIAGNOSIS — E0859 Diabetes mellitus due to underlying condition with other circulatory complications: Secondary | ICD-10-CM

## 2021-01-12 DIAGNOSIS — Z951 Presence of aortocoronary bypass graft: Secondary | ICD-10-CM

## 2021-01-12 DIAGNOSIS — I2510 Atherosclerotic heart disease of native coronary artery without angina pectoris: Secondary | ICD-10-CM | POA: Diagnosis not present

## 2021-01-12 DIAGNOSIS — Z794 Long term (current) use of insulin: Secondary | ICD-10-CM

## 2021-01-12 DIAGNOSIS — I6523 Occlusion and stenosis of bilateral carotid arteries: Secondary | ICD-10-CM

## 2021-01-12 DIAGNOSIS — E785 Hyperlipidemia, unspecified: Secondary | ICD-10-CM

## 2021-01-12 DIAGNOSIS — G629 Polyneuropathy, unspecified: Secondary | ICD-10-CM

## 2021-01-12 DIAGNOSIS — IMO0002 Reserved for concepts with insufficient information to code with codable children: Secondary | ICD-10-CM

## 2021-01-12 MED ORDER — CARVEDILOL 25 MG PO TABS: 37.5000 mg | ORAL_TABLET | Freq: Two times a day (BID) | ORAL | 5 refills | Status: DC

## 2021-01-12 MED ORDER — TORSEMIDE 20 MG PO TABS: 20.0000 mg | ORAL_TABLET | Freq: Two times a day (BID) | ORAL | 5 refills | Status: DC

## 2021-01-12 NOTE — Patient Instructions (Signed)
Medication Instructions:  Your physician has recommended you make the following change in your medication:   1) INCREASE Carvedilol to 37.5 (1 and 1/2 tablet) twice daily.  2) START Torsemide 20 mg daily. An Rx has been sent to your pharmacy.  *If you need a refill on your cardiac medications before your next appointment, please call your pharmacy*   Lab Work: Bmet today  Your physician recommends that you return for lab work Artist) in: 2 weeks  Please have your lab drawn at the [facility]. You doe not need an appt. Lab hours are Mon-Fri 7am-6pm    If you have labs (blood work) drawn today and your tests are completely normal, you will receive your results only by: Marland Kitchen MyChart Message (if you have MyChart) OR . A paper copy in the mail If you have any lab test that is abnormal or we need to change your treatment, we will call you to review the results.   Testing/Procedures: None ordered  Follow-Up: At [facility], you and your health needs are our priority.  As part of our continuing mission to provide you with exceptional heart care, we have created designated Provider Care Teams.  These Care Teams include your primary Cardiologist (physician) and Advanced Practice Providers (APPs -  Physician Assistants and Nurse Practitioners) who all work together to provide you with the care you need, when you need it.  We recommend signing up for the patient portal called "MyChart".  Sign up information is provided on this After Visit Summary.  MyChart is used to connect with patients for Virtual Visits (Telemedicine).  Patients are able to view lab/test results, encounter notes, upcoming appointments, etc.  Non-urgent messages can be sent to your provider as well.   To learn more about what you can do with MyChart, go to [email or web address].    Your next appointment:   4 week(s)  The format for your next appointment:   In Person  Provider:   You may see [name], MD  or one of the following Advanced Practice Providers on your designated Care Team:    Murray Hodgkins, NP  Christell Faith, PA-C  Marrianne Mood, PA-C  C[place]ence [place], [place]  Laurann Montana, NP    Other Instructions You have been referred to [facility] with Dr. Debara Pickett at our [facility] office in [place] have been referred to [employer]. There office will contact your to schedule the appt.

## 2021-01-12 NOTE — Progress Notes (Incomplete)
Office Visit    Patient Name: Erika [name] Date of Encounter: 01/12/2021  Primary Care Provider:  Lesleigh Noe, MD Primary Cardiologist:  [name], MD  Chief Complaint    Chief Complaint  Patient presents with  . Follow-up    6 months  Pt states she has been having some pain in her legs from PAD, states it is really bad. No other Sx.   50 year old female with history of CAD s/p CABG with recent repeat cath 08/2020, carotid disease s/p right carotid endarterectomy x2, hypertension, PAD s/p stenting left SFA with severe bilateral calf claudication symptoms, tobacco use, and who presents today for follow-up of CAD and PAD.  Past Medical History    Past Medical History:  Diagnosis Date  . Arthralgia of temporomandibular joint   . CAD, multiple vessel    a. cath 06/29/16: ostLM 40%, ostLAD 40%, pLAD 95%, ost-pLCx 60%, pLCx 95%, mLCx 60%, mRCA 95%, D2 50%, LVSF nl;  b. 07/2016 CABG x 4 (LIMA->LAD, VG->Diag, VG->OM, VG->RCA); c. 08/2016 Cath: 3VD w/ 4/4 patent grafts. LAD distal to LIMA has diff dzs->Med rx.  . Carotid arterial disease ([place])    a. 07/2016 s/p R CEA.  . Clotting disorder ([place])   . Depression   . Diastolic dysfunction    a. echo 06/28/16: EF 50-55%, mild inf wall HK, GR1DD, mild MR, RV sys fxn nl, mildly dilated LA, PASP nl  . Fatty liver disease, nonalcoholic 9983  . History of blood transfusion    with heart surgery  . HLD (hyperlipidemia)   . Labile hypertension    a. prior renal ngiogram negative for RAS in 03/2016; b. catecholamines and metanephrines normal, mildly elevated renin with normal aldosterone and normal ratio in 02/2016  . Myocardial infarction ([place]) 2017  . Obesity   . PAD (peripheral artery disease) ([facility])    in both legs  . PTSD (post-traumatic stress disorder)   . Tobacco abuse    In 2018 - pt cut back from 2 ppd to 0.5 ppd.- still 1/2 ppd as of 04/22/20  . Type 2 diabetes mellitus [facility]) January 2017   Past Surgical History:   Procedure Laterality Date  . ABDOMINAL AORTOGRAM W/LOWER EXTREMITY [name] 10/15/2018   Procedure: ABDOMINAL AORTOGRAM W/LOWER EXTREMITY;  Surgeon: Wellington Hampshire, MD;  Location: [employer] CV LAB;  Service: Cardiovascular;  Laterality: [name];  . ABDOMINAL AORTOGRAM W/LOWER EXTREMITY Bilateral 08/19/2019   Procedure: ABDOMINAL AORTOGRAM W/LOWER EXTREMITY;  Surgeon: Wellington Hampshire, MD;  Location: [place] CV LAB;  Service: Cardiovascular;  Laterality: Bilateral;  . CARDIAC CATHETERIZATION [name] 06/29/2016   Procedure: Left Heart Cath and Coronary Angiography;  Surgeon: Minna Merritts, MD;  Location: [place] CV LAB;  Service: Cardiovascular;  Laterality: [name];  . CARDIAC CATHETERIZATION [name] 08/29/2016   Procedure: Left Heart Cath and Cors/Grafts Angiography;  Surgeon: Wellington Hampshire, MD;  Location: [place] CV LAB;  Service: Cardiovascular;  Laterality: [name];  . CESAREAN SECTION    . CHOLECYSTECTOMY    . CORONARY ARTERY BYPASS GRAFT [name] 07/06/2016   Procedure: CORONARY ARTERY BYPASS GRAFTING (CABG) x four, using left internal mammary artery and right leg greater saphenous vein harvested endoscopically;  Surgeon: Ivin Poot, MD;  Location: [place];  Service: Open Heart Surgery;  Laterality: [name];  . ENDARTERECTOMY Right 07/06/2016   Procedure: ENDARTERECTOMY CAROTID;  Surgeon: Rosetta Posner, MD;  Location: [place];  Service: Vascular;  Laterality: Right;  . ENDARTERECTOMY Right 04/27/2020  Procedure: REDO OF RIGHT ENDARTERECTOMY CAROTID;  Surgeon: Rosetta Posner, MD;  Location: [place];  Service: Vascular;  Laterality: Right;  . LEFT HEART CATH AND CORS/GRAFTS ANGIOGRAPHY [name] 08/24/2020   Procedure: LEFT HEART CATH AND CORS/GRAFTS ANGIOGRAPHY;  Surgeon: Wellington Hampshire, MD;  Location: [place] CV LAB;  Service: Cardiovascular;  Laterality: [name];  . PERIPHERAL VASCULAR CATHETERIZATION [name] 04/18/2016   Procedure: Renal Angiography;  Surgeon: Wellington Hampshire, MD;  Location: [place] CV LAB;   Service: Cardiovascular;  Laterality: [name];  . PERIPHERAL VASCULAR INTERVENTION Left 10/15/2018   Procedure: PERIPHERAL VASCULAR INTERVENTION;  Surgeon: Wellington Hampshire, MD;  Location: [place] CV LAB;  Service: Cardiovascular;  Laterality: Left;  Left superficial femoral  . TEE WITHOUT CARDIOVERSION [name] 07/06/2016   Procedure: TRANSESOPHAGEAL ECHOCARDIOGRAM (TEE);  Surgeon: Ivin Poot, MD;  Location: [place];  Service: Open Heart Surgery;  Laterality: [name];  . TONSILLECTOMY      Allergies  Allergies  Allergen Reactions  . Chantix [Varenicline Tartrate] Other (See Comments)    Feels "crazy" and angry     History of Present Illness    Erika [name] is a 50 y.o. female with PMH as above.  She has a history of 2017 NSTEMI and found to have 3 CAD with subsequent CABG, as well as carotid disease s/p right carotid endarterectomy.  She was rehospitalized September 2017 with CP in the setting of uncontrolled hypertension.  Catheterization showed patent grafts.  LAD was noted to have diffuse disease distal to the anastomosis and very small in caliber.  EF normal with mildly elevated LVEDP.  Her blood pressure has been difficult to control and labile in the past.  Previous angiography showed no renal artery stenosis.  She has a history of intolerance to hydralazine due to GI symptoms.  She also has a history of PAD s/p stenting of the left SFA.  She was evaluated by Dr. Fletcher Anon for worsening left calf claudication with drop in ABI.  Angiography 07/2019 showed no significant aortoiliac disease, patent left SFA stent, proximal portion of SFA diffusely diseased with short occlusion distal to the stent.  Additionally, anterior tibial was diffusely diseased and occluded distally.  Peroneal artery was diffusely diseased.  No revascularization performed.  She was seen 02/05/2020 and reported she had cut back to smoking a pack and 1/2/week and continue to work on smoking.  She reported stable bilateral  leg pain.  She stated she was able to walk around her house once without pain but, if walking around it twice, she noted pain.  The symptoms were stable when compared with those in September.  Recently seen in clinic by her primary cardiologist, Dr. Fletcher Anon 09/06/2020.  At that time, it was noted that recent cardiac catheterization showed progression of native coronary artery disease but patent grafts.  She was continued on long-acting nitrate and beta-blocker.  Medical therapy was recommended for maximizing her PAD with severe bilateral calf claudication and bilateral SFA occlusion.  It was recommended she quit smoking and engage in a walking exercise program.  Revascularization was recommended for refractory symptoms.  Given she had to redo right carotid endarterectomy, aggressive risk factor modification was recommended.  BP was noted to be elevated, in spite of blood pressure medications.  It was noted she may be a candidate for renal denervation therapy if it becomes approved.  She was continued on Repatha.  Smoking cessation recommended.  ***  Admission and arida visit Today, she returns to clinic  and reports progressive symptoms of claudication, as well as lower extremity edema.  She reports pain with minimal ambulation.  In addition, she reports foot drop on the left foot.  No chest pain, racing heart rate, or palpitations.  No presyncope or syncope.  No signs or symptoms of worsening heart failure.  She reports medication compliance.  Some day BP high 190/110, lowest 98   Racing HR Palpi Hear her heart Not able to walk Non alcoholic fatty liver     Home Medications    Prior to Admission medications   Medication Sig Start Date End Date Taking? Authorizing Provider  albuterol (PROVENTIL HFA;VENTOLIN HFA) 108 (90 Base) MCG/ACT inhaler Inhale 2 puffs into the lungs every 6 (six) hours as needed for wheezing. 03/02/19 04/13/20  Lucille Passy, MD  amLODipine (NORVASC) 10 MG tablet Take 1  tablet (10 mg total) by mouth daily. 01/18/20 04/17/20  Lucille Passy, MD  amoxicillin (AMOXIL) 500 MG capsule Take 1 capsule (500 mg total) by mouth 3 (three) times daily. 06/09/20   Libby Maw, MD  aspirin EC 81 MG tablet Take 1 tablet (81 mg total) by mouth daily. 07/17/17   Theora Gianotti, NP  benztropine (COGENTIN) 0.5 MG tablet Take 1 tablet (0.5 mg total) by mouth at bedtime. 05/17/20 05/17/21  Arfeen, Arlyce Harman, MD  carvedilol (COREG) 25 MG tablet Take 1 tablet (25 mg total) by mouth 2 (two) times daily with a meal. 01/18/20   Lucille Passy, MD  chlorproMAZINE (THORAZINE) 100 MG tablet Take 1 tablet (100 mg total) by mouth at bedtime. 05/31/20   Arfeen, Arlyce Harman, MD  Cholecalciferol (VITAMIN D3) 125 MCG (5000 UT) CAPS Take 1 capsule (5,000 Units total) by mouth daily with breakfast. Take along with calcium and magnesium. Patient not taking: Reported on 06/09/2020 04/20/20 10/17/20  Libby Maw, MD  clonazePAM (KLONOPIN) 0.5 MG tablet Take 1 tablet (0.5 mg total) by mouth 3 (three) times daily as needed for anxiety. 05/17/20   Arfeen, Arlyce Harman, MD  cloNIDine (CATAPRES) 0.1 MG tablet Take 1 tablet (0.1 mg total) by mouth 2 (two) times daily. 02/19/20   Loel Dubonnet, NP  clopidogrel (PLAVIX) 75 MG tablet TAKE 1 TABLET BY MOUTH EVERY DAY Patient taking differently: Take 75 mg by mouth daily.  02/11/20   Wellington Hampshire, MD  cyclobenzaprine (FEXMID) 7.5 MG tablet TAKE 1 TABLET BY MOUTH THREE TIMES A DAY AS NEEDED FOR MUSCLE SPASMS Patient taking differently: Take 7.5 mg by mouth 3 (three) times daily as needed for muscle spasms.  01/18/20   Lucille Passy, MD  dapagliflozin propanediol (FARXIGA) 5 MG TABS tablet Take 5 mg by mouth daily before breakfast. 01/08/20   Renato Shin, MD  Evolocumab (REPATHA SURECLICK) [id number] MG/ML SOAJ Inject 1 pen into the skin every 14 (fourteen) days. 12/29/19   Wellington Hampshire, MD  FLUoxetine HCl 60 MG TABS Take 60 mg by mouth daily. 05/17/20    Arfeen, Arlyce Harman, MD  insulin glargine, 2 Unit Dial, (TOUJEO MAX SOLOSTAR) 300 UNIT/ML Solostar Pen Inject 110 Units into the skin every morning. Patient taking differently: Inject 110 Units into the skin in the morning.  04/11/20   Renato Shin, MD  insulin lispro (HUMALOG KWIKPEN) 100 UNIT/ML KwikPen Inject 0.03 mLs (3 Units total) into the skin 3 (three) times daily with meals. Take only if blood sugar is over 250.  Also pen needles 4/day 10/01/19   Renato Shin, MD  Insulin Syringe-Needle  U-100 29G X 1/2" 0.3 ML MISC 1 each by Does not apply route 2 (two) times daily. 05/20/17   Nita Sells, MD  isosorbide mononitrate (IMDUR) 60 MG 24 hr tablet Take 1 tablet (60 mg total) by mouth daily. 02/05/20 08/03/20  Loel Dubonnet, NP  lamoTRIgine (LAMICTAL) 200 MG tablet Take 1 tablet (200 mg total) by mouth daily. 05/17/20   Arfeen, Arlyce Harman, MD  losartan (COZAAR) 100 MG tablet Take 1 tablet (100 mg total) by mouth daily. 01/18/20   Lucille Passy, MD  magic mouthwash SOLN Take 15 mLs by mouth 3 (three) times daily as needed for mouth pain. 01/18/20   Lucille Passy, MD  naloxone [facility]) nasal spray 4 mg/0.1 mL 4 mg (contents of 1 nasal spray) as a single dose in one nostril; may repeat every 2 to 3 minutes in alternating nostrils until medical assistance becomes available Patient not taking: Reported on 06/09/2020 12/25/18   Lucille Passy, MD  nicotine (NICODERM CQ) 14 mg/24hr patch Place 1 patch (14 mg total) onto the skin daily. 01/18/20   Lucille Passy, MD  nitroGLYCERIN (NITROSTAT) 0.4 MG SL tablet Place 1 tablet (0.4 mg total) under the tongue every 5 (five) minutes as needed for chest pain. 01/06/20   Lucille Passy, MD  ondansetron (ZOFRAN) 4 MG tablet Take 1 tablet (4 mg total) by mouth daily as needed. 01/18/20   Lucille Passy, MD  oxyCODONE (OXY IR/ROXICODONE) 5 MG immediate release tablet Take 1 tablet (5 mg total) by mouth 2 (two) times daily as needed for severe pain. Must last 30 days. 04/27/20  05/27/20  Milinda Pointer, MD  oxyCODONE (OXY IR/ROXICODONE) 5 MG immediate release tablet Take 1 tablet (5 mg total) by mouth 2 (two) times daily as needed for severe pain. Must last 30 days. 05/27/20 06/26/20  Milinda Pointer, MD  oxyCODONE-acetaminophen (PERCOCET) 10-325 MG tablet Take 1 tablet by mouth every 6 (six) hours as needed for pain. Patient not taking: Reported on 06/09/2020 04/28/20 04/28/21  Vaughan Basta, Ed[address] Circle, PA-C  pregabalin (LYRICA) 225 MG capsule Take 1 capsule (225 mg total) by mouth 3 (three) times daily. 02/23/20 08/21/20  Milinda Pointer, MD  spironolactone (ALDACTONE) 25 MG tablet Take 1 tablet (25 mg total) by mouth daily. 01/18/20   Lucille Passy, MD    Review of Systems    She denies chest pain, palpitations, dyspnea, pnd, orthopnea, n, v, dizziness, syncope, weight gain, or early satiety. She reports LEE and claudication.   All other systems reviewed and are otherwise negative except as noted above.  Physical Exam    VS:  BP (!) 144/98   Pulse (!) 105   Ht 5\' 7"  (1.702 m)   Wt 197 lb (89.4 kg)   BMI 30.85 kg/m  , BMI Body mass index is 30.85 kg/m. GEN: Well nourished, well developed, in no acute distress. HEENT: normal. Neck: Supple, no JVD, carotid bruits, or masses. Cardiac: RRR, 1/6 systolic murmur. No rubs, or gallops. No clubbing, cyanosis, mild bilateral edema.  Radials/DP/PT 1+ and equal bilaterally (worse on L side with pedal pulse confirmed via doppler).  Respiratory:  Respirations regular and unlabored, clear to auscultation bilaterally. GI: Soft, nontender, nondistended, BS + x 4. MS: no deformity or atrophy. Skin: warm and dry, no rash. Neuro:  Strength and sensation are intact. Psych: Normal affect.  Accessory Clinical Findings    ECG personally reviewed by me today - NSR, 81bpm, no acute change from previous -  no acute changes.  VITALS Reviewed today   Temp Readings from Last 3 Encounters:  01/02/21 (!) 97.3 F (36.3 C) (Temporal)  11/23/20  98.4 F (36.9 C) (Oral)  11/21/20 98.2 F (36.8 C) (Temporal)   BP Readings from Last 3 Encounters:  01/12/21 (!) 144/98  01/02/21 (!) 168/91  11/23/20 (!) 150/95   Pulse Readings from Last 3 Encounters:  01/12/21 (!) 105  01/02/21 92  11/23/20 90    Wt Readings from Last 3 Encounters:  01/12/21 197 lb (89.4 kg)  01/02/21 201 lb 3 oz (91.3 kg)  11/23/20 196 lb (88.9 kg)     LABS  reviewed today   Lab Results  Component Value Date   WBC 10.8 (H) 11/21/2020   HGB 15.5 (H) 11/21/2020   HCT 46.5 (H) 11/21/2020   MCV 89.9 11/21/2020   PLT 404.0 (H) 11/21/2020   Lab Results  Component Value Date   CREATININE 0.63 11/21/2020   BUN 6 11/21/2020   NA 135 11/21/2020   K 4.7 11/21/2020   CL 97 11/21/2020   CO2 32 11/21/2020   Lab Results  Component Value Date   ALT 36 (H) 11/21/2020   AST 16 11/21/2020   ALKPHOS 96 11/21/2020   BILITOT 0.3 11/21/2020   Lab Results  Component Value Date   CHOL 214 (H) 11/21/2020   HDL 42.80 11/21/2020   LDLCALC 80 09/07/2019   LDLDIRECT 112.0 11/21/2020   TRIG (H) 11/21/2020    488.0 Triglyceride is over 400; calculations on Lipids are invalid.   CHOLHDL 5 11/21/2020    Lab Results  Component Value Date   HGBA1C 10.3 (H) 11/21/2020   Lab Results  Component Value Date   TSH 1.26 11/21/2020     STUDIES/PROCEDURES reviewed today   03/21/20 Carotids Summary:  Right Carotid: Velocities in the right ICA are consistent with a 80-99% stenosis. Non-hemodynamically significant plaque <50% noted  in the CCA. ICA stenosis has increased since prior study. ECA  not  seen, there is a question of occlusion.  Left Carotid: Velocities in the left ICA are consistent with a 40-59%  stenosis.  The ECA appears >50% stenosed. ICA stenosis has increased  since prior study.  Vertebrals: Right vertebral artery demonstrates antegrade flow. Left  vertebral  artery demonstrates no discernable flow. ? occlusion of left  vertebral artery.   Subclavians: Normal flow hemodynamics were seen in bilateral subclavian        arteries.   07/2019 Abdominal aortogram with LE 1.  No significant aortoiliac disease. 2.  Left lower extremity: Patent mid to distal SFA stent.  However, the proximal portion is now diffusely diseased and in addition, the SFA is occluded distal to the stent which is new.  Also the anterior tibial artery is diffusely diseased and occluded distally and the peroneal artery is also diffusely diseased. Recommendations: Unfortunately, the patient seems to have very aggressive disease.  Endovascular revascularization of the left SFA will require working on the whole vessel with a high chance of reocclusion especially in the setting of continued tobacco use.  The best option at the present time is likely medical therapy and a structured walking program through cardiac rehab.  Long-term if revascularization is required, femoral-popliteal bypass likely has better long-term outcome but that should be left as a last resort. I am going to switch the patient from Plavix to Pletal for symptomatic relief. I strongly advised the patient to quit smoking.  08/29/2016 Cath  Ost LM to LM lesion,  30 %stenosed.  Ost Cx to Prox Cx lesion, 60 %stenosed.  Ost LAD lesion, 50 %stenosed.  Ost 1st Diag to 1st Diag lesion, 60 %stenosed.  Prox LAD lesion, 95 %stenosed.  Prox Cx lesion, 90 %stenosed.  Mid Cx lesion, 60 %stenosed.  Mid RCA lesion, 100 %stenosed.  Seq SVG- om1 graft was visualized by angiography and is normal in caliber.  The graft exhibits minimal luminal irregularities.  Seq SVG- graft was visualized by angiography and is normal in caliber and anatomically normal.  1st Diag lesion, 50 %stenosed.  SVG and is normal in caliber and anatomically normal.  LIMA and is normal in caliber and anatomically normal.  Mid LAD to Dist LAD lesion, 70 %stenosed.  The left ventricular systolic function is normal.  LV  end diastolic pressure is mildly elevated.  The left ventricular ejection fraction is 55-65% by visual estimate. 1. Severe underlying three-vessel coronary artery disease with patent grafts including SVG to right PDA, sequential vein graft to diagonal 1/ OM 1 and LIMA to LAD. The LIMA makes an unusual turn distally before the anastomosis with diffuse disease in the LAD distal to the anastomosis. The vessel is overall small in caliber with the dominant vessel being the large diagonal branch. 2. Normal LV systolic function and mildly elevated left ventricular end-diastolic pressure. Recommendations: Continue aggressive medical therapy. I suspect that her symptoms were likely related to uncontrolled hypertension. I increased the dose of carvedilol to 25 mg twice daily. I agree with the addition of small dose long acting nitroglycerin as well. Monitor overnight and possible discharge home tomorrow.  Intraoperative TEE 07/06/2016 - Left ventricle: Systolic function was mildly reduced. The  estimated ejection fraction was in the range of 45% to 50%.  - Mitral valve: There was mild regurgitation.  - Left atrium: No evidence of thrombus in the atrial cavity or  appendage.  - Right atrium: No evidence of thrombus in the atrial cavity or  appendage.  - Atrial septum: No defect or patent foramen ovale was identified.  - Pulmonic valve: No evidence of vegetation.   Assessment & Plan    PAD with bilateral calf claudication --Reports rapid progression of symptoms since 01/2020.  Known diffuse disease in the SFA with occlusion distally. Today, pulses faint on LLE but obtained with doppler. 07/2019 imaging showed proximal portion of SFA stent diffusely diseased and in addition the SFA occluded distal to the stent, which was new. Also, anterior tibial artery diffusely diseased and occluded distally and peroneal artery diffusely diseased. Known aaggressive disease.  As noted in past, revascularization of left  SFA is possible but will require working on the entire vessel with high likelihood of reocclusion. Medical therapy thus recommended. --Continue medical therapy with ASA, Plavix, Repatha.  Continue ambulation/regular walking program.  Continue smoking cessation.  Will update LE vascular studies today given patient's report of rapid progression of symptoms and per patient preference.  As noted in past, if revascularization is needed, best option will be a femoral popliteal bypass and as last resort. Recommend follow-up with Dr. Fletcher Anon to discuss studies.   CAD --No angina. Continue walking program. S/p CABG 06/2016. Continue current medications. No indication for repeat ischemic evaluation at this time. Reassess at RTC.  Carotid dz s/p R carotid endarterectomy --As in HPI. Continue statin and DAPT / cholesterol control. Smoking cessation.   HTN --BP at goal. No medication changes at this time.   HLD --Continue Repatha. Previous LDL not at goal. Recheck lipids at RTC.  Smoking --Continue to work on quitting smoking.   DM2 --Glycemic control recommended.    Arvil Chaco, PA-C

## 2021-01-13 ENCOUNTER — Encounter: Payer: Self-pay | Admitting: Physician Assistant

## 2021-01-13 ENCOUNTER — Telehealth: Payer: Self-pay

## 2021-01-13 LAB — BASIC METABOLIC PANEL
BUN/Creatinine Ratio: 18 (ref 9–23)
BUN: 14 mg/dL (ref 6–24)
CO2: 21 mmol/L (ref 20–29)
Calcium: 10.5 mg/dL — ABNORMAL HIGH (ref 8.7–10.2)
Chloride: 96 mmol/L (ref 96–106)
Creatinine, Ser: 0.77 mg/dL (ref 0.57–1.00)
GFR calc Af Amer: 106 mL/min/{1.73_m2} (ref >59)
GFR calc non Af Amer: 92 mL/min/{1.73_m2} (ref >59)
Glucose: 265 mg/dL — ABNORMAL HIGH (ref 65–99)
Potassium: 4.8 mmol/L (ref 3.5–5.2)
Sodium: 132 mmol/L — ABNORMAL LOW (ref 134–144)

## 2021-01-13 NOTE — Telephone Encounter (Addendum)
Patient notified that her insurance card has an incorrect spelling of her last name of [place] and that she needs to reach out to her insurance company to have corrected. The patient is aware that the prior authorization for Repatha has been submitted but there may be a delay due to incorrect information on her insurance card with the information we have in her chart. The patients correct last name is [place].

## 2021-01-13 NOTE — Telephone Encounter (Signed)
Prior Authorization for [employer] (Key: [id number]) Repatha SureClick 140MG /ML auto-injectors Per Ronalee Belts with [email or web address] stated the patients name is different on the insurance card with the demographics that we provided.  Patient will be contacted with the insurance name correction.  PA number changed to as above.  Waiting approval.

## 2021-01-13 NOTE — Telephone Encounter (Signed)
Patient returning call.

## 2021-01-13 NOTE — Progress Notes (Signed)
Office Visit    Patient Name: Erika [name] Date of Encounter: 01/12/2021  Primary Care Provider:  Lesleigh Noe, MD Primary Cardiologist:  [name], MD  Chief Complaint    Chief Complaint  Patient presents with   Follow-up    6 months    Pt states she has been having some pain in her legs from PAD, states it is really bad. No other Sx.   50 year old female with history of 3v CAD s/p CABG with recent repeat cath 08/2020, carotid disease s/p right carotid endarterectomy x2, hypertension, HLD, hypertiglyceridemia, PAD s/p stenting left SFA with severe bilateral SFA occlusion and bilateral calf claudication symptoms, tobacco use, DM2 on insulin, nonalcoholic fatty liver dz, neuropathy, and who presents today for follow-up of CAD and PAD.  Past Medical History    Past Medical History:  Diagnosis Date   Arthralgia of temporomandibular joint    CAD, multiple vessel    a. cath 06/29/16: ostLM 40%, ostLAD 40%, pLAD 95%, ost-pLCx 60%, pLCx 95%, mLCx 60%, mRCA 95%, D2 50%, LVSF nl;  b. 07/2016 CABG x 4 (LIMA->LAD, VG->Diag, VG->OM, VG->RCA); c. 08/2016 Cath: 3VD w/ 4/4 patent grafts. LAD distal to LIMA has diff dzs->Med rx.   Carotid arterial disease ([place])    a. 07/2016 s/p R CEA.   Clotting disorder ([place])    Depression    Diastolic dysfunction    a. echo 06/28/16: EF 50-55%, mild inf wall HK, GR1DD, mild MR, RV sys fxn nl, mildly dilated LA, PASP nl   Fatty liver disease, nonalcoholic 7035   History of blood transfusion    with heart surgery   HLD (hyperlipidemia)    Labile hypertension    a. prior renal ngiogram negative for RAS in 03/2016; b. catecholamines and metanephrines normal, mildly elevated renin with normal aldosterone and normal ratio in 02/2016   Myocardial infarction [facility]) 2017   Obesity    PAD (peripheral artery disease) ([facility])    in both legs   PTSD (post-traumatic stress disorder)    Tobacco abuse    In 2018 - pt cut back from 2 ppd to 0.5  ppd.- still 1/2 ppd as of 04/22/20   Type 2 diabetes mellitus [facility]) January 2017   Past Surgical History:  Procedure Laterality Date   ABDOMINAL AORTOGRAM W/LOWER EXTREMITY [name] 10/15/2018   Procedure: ABDOMINAL AORTOGRAM W/LOWER EXTREMITY;  Surgeon: Wellington Hampshire, MD;  Location: [place] [facility];  Service: Cardiovascular;  Laterality: [name];   ABDOMINAL AORTOGRAM W/LOWER EXTREMITY Bilateral 08/19/2019   Procedure: ABDOMINAL AORTOGRAM W/LOWER EXTREMITY;  Surgeon: Wellington Hampshire, MD;  Location: [place] [facility];  Service: Cardiovascular;  Laterality: Bilateral;   CARDIAC CATHETERIZATION [name] 06/29/2016   Procedure: Left Heart Cath and Coronary Angiography;  Surgeon: Minna Merritts, MD;  Location: [place] [facility];  Service: Cardiovascular;  Laterality: [name];   CARDIAC CATHETERIZATION [name] 08/29/2016   Procedure: Left Heart Cath and Cors/Grafts Angiography;  Surgeon: Wellington Hampshire, MD;  Location: [place] [facility];  Service: Cardiovascular;  Laterality: [name];   CESAREAN SECTION     CHOLECYSTECTOMY     CORONARY ARTERY BYPASS GRAFT [name] 07/06/2016   Procedure: CORONARY ARTERY BYPASS GRAFTING (CABG) x four, using left internal mammary artery and right leg greater saphenous vein harvested endoscopically;  Surgeon: Ivin Poot, MD;  Location: [place];  Service: Open Heart Surgery;  Laterality: [name];   ENDARTERECTOMY Right 07/06/2016   Procedure: ENDARTERECTOMY CAROTID;  Surgeon: Rosetta Posner,  MD;  Location: [facility];  Service: Vascular;  Laterality: Right;   ENDARTERECTOMY Right 04/27/2020   Procedure: REDO OF RIGHT ENDARTERECTOMY CAROTID;  Surgeon: Rosetta Posner, MD;  Location: [facility];  Service: Vascular;  Laterality: Right;   LEFT HEART CATH AND CORS/GRAFTS ANGIOGRAPHY [name] 08/24/2020   Procedure: LEFT HEART CATH AND CORS/GRAFTS ANGIOGRAPHY;  Surgeon: Wellington Hampshire, MD;  Location: [place] [facility];  Service: Cardiovascular;  Laterality: [name];   PERIPHERAL VASCULAR CATHETERIZATION [name]  04/18/2016   Procedure: Renal Angiography;  Surgeon: Wellington Hampshire, MD;  Location: [place] [facility];  Service: Cardiovascular;  Laterality: [name];   PERIPHERAL VASCULAR INTERVENTION Left 10/15/2018   Procedure: PERIPHERAL VASCULAR INTERVENTION;  Surgeon: Wellington Hampshire, MD;  Location: [place] [facility];  Service: Cardiovascular;  Laterality: Left;  Left superficial femoral   TEE WITHOUT CARDIOVERSION [name] 07/06/2016   Procedure: TRANSESOPHAGEAL ECHOCARDIOGRAM (TEE);  Surgeon: Ivin Poot, MD;  Location: [place];  Service: Open Heart Surgery;  Laterality: [name];   TONSILLECTOMY      Allergies  Allergies  Allergen Reactions   Chantix [Varenicline Tartrate] Other (See Comments)    Feels "crazy" and angry     History of Present Illness    Erika [name] is a 50 y.o. female with PMH as above.   She reports current tobacco use and working towards smoking cessation.   She has known h/o HTN, which has been difficult to control.  BP labile.  Previous renal ultrasound without RAS.   She has a history of intolerance to hydralazine due to GI symptoms.  She has known history of severe mixed HLD. Despite tx with Repatha, LDL remains uncontrolled.   She has a history of CAD.  2017 Cath 2/2 NSTEMI with subsequent CABG for 3v CAD and R carotid endarterectomy.  08/2016 Cath after admitted with CP 2/2 uncontrolled BP with patent grafts.  LAD diffuse dz was distal to the anastomosis and very small in caliber.   EF nl with mildly elevated LVEDP.    As above, known carotid dz s/p 2017 R endarterectomy. She underwent repeat imaging in spring 2021 with subsequent 03/2020 redo R carotid endarterectomy performed by Dr. Donnetta Hutching.  She has a history of PAD with bilateral occlusion of SFA. She underwent L side SFA stenting but subsequently found to have occluded L SFA. Seen 6/2021with report of R leg claudication. Vascular testing performed and showed drop in ABI on the R from normal to the 0.5  range. She now has b/l SFA occulusion and moderately reduced ABI. Smoking cessation advised & aggressive medical management has been recommended with intervention if refractory sx. Unfortunately, she continues to note severe claudication with short distances. She is barely able to walk to her mailbox. She also continues to smoke though trying to cut back on her daily cigarettes.  Seen 08/22/20 in clinic for anginal sx and back pain, and after recent visit to [facility] ED for these sx. HS Tn minimally elevated in the ED. She politely declined admission after estimated wait time to see MD was quoted at 10 hours. Per office notes, she felt a RHC may be helpful. She had increased use of SL nitro.  08/2020 Cath subsequently performed as below. This showed progression of native CAD but patent grafts without obstructive dz and medical tx recommended.   At her follow-up visit, she noted improved sx. Her main limitation was claudication.  Both she and her husband were trying to quit smoking at the same  time. She was losing wt with healthy lifestyle changes. Walking program was recommended. Revascularization was recommended for refractory symptoms. Aggressive risk factor modification was recommended.  BP was noted to be elevated, in spite of blood pressure medications.  She was thought to be a candidate for renal denervation therapy if it becomes approved.    Today, she RTC and notes ongoing claudication with minimal exertion/ ambulation. She has DOE, which is new for her. No SOB at rest. She also notes abdominal distention / bloating.   No chest pain. She does note some tachypalpitations and that she can sometimes hear her heart. racing heart rate, or palpitations.  No presyncope or syncope. No orthopnea or PND. No LEE / improved LEE from that of previous visits.  She reports medication compliance. Home SBP labile from 190-98 and DBP 110s. She reports recently establishing with endocrinology for DM2. LEE improved. She has  foot drop on the left foot.   Home Medications    Prior to Admission medications   Medication Sig Start Date End Date Taking? Authorizing Provider  albuterol (PROVENTIL HFA;VENTOLIN HFA) 108 (90 Base) MCG/ACT inhaler Inhale 2 puffs into the lungs every 6 (six) hours as needed for wheezing. 03/02/19 04/13/20  Lucille Passy, MD  amLODipine (NORVASC) 10 MG tablet Take 1 tablet (10 mg total) by mouth daily. 01/18/20 04/17/20  Lucille Passy, MD  amoxicillin (AMOXIL) 500 MG capsule Take 1 capsule (500 mg total) by mouth 3 (three) times daily. 06/09/20   Libby Maw, MD  aspirin EC 81 MG tablet Take 1 tablet (81 mg total) by mouth daily. 07/17/17   Theora Gianotti, NP  benztropine (COGENTIN) 0.5 MG tablet Take 1 tablet (0.5 mg total) by mouth at bedtime. 05/17/20 05/17/21  Arfeen, Arlyce Harman, MD  carvedilol (COREG) 25 MG tablet Take 1 tablet (25 mg total) by mouth 2 (two) times daily with a meal. 01/18/20   Lucille Passy, MD  chlorproMAZINE (THORAZINE) 100 MG tablet Take 1 tablet (100 mg total) by mouth at bedtime. 05/31/20   Arfeen, Arlyce Harman, MD  Cholecalciferol (VITAMIN D3) 125 MCG (5000 UT) CAPS Take 1 capsule (5,000 Units total) by mouth daily with breakfast. Take along with calcium and magnesium. Patient not taking: Reported on 06/09/2020 04/20/20 10/17/20  Libby Maw, MD  clonazePAM (KLONOPIN) 0.5 MG tablet Take 1 tablet (0.5 mg total) by mouth 3 (three) times daily as needed for anxiety. 05/17/20   Arfeen, Arlyce Harman, MD  cloNIDine (CATAPRES) 0.1 MG tablet Take 1 tablet (0.1 mg total) by mouth 2 (two) times daily. 02/19/20   Loel Dubonnet, NP  clopidogrel (PLAVIX) 75 MG tablet TAKE 1 TABLET BY MOUTH EVERY DAY Patient taking differently: Take 75 mg by mouth daily.  02/11/20   Wellington Hampshire, MD  cyclobenzaprine (FEXMID) 7.5 MG tablet TAKE 1 TABLET BY MOUTH THREE TIMES A DAY AS NEEDED FOR MUSCLE SPASMS Patient taking differently: Take 7.5 mg by mouth 3 (three) times daily as needed for  muscle spasms.  01/18/20   Lucille Passy, MD  dapagliflozin propanediol (FARXIGA) 5 MG TABS tablet Take 5 mg by mouth daily before breakfast. 01/08/20   Renato Shin, MD  Evolocumab (REPATHA SURECLICK) [id number] MG/ML SOAJ Inject 1 pen into the skin every 14 (fourteen) days. 12/29/19   Wellington Hampshire, MD  FLUoxetine HCl 60 MG TABS Take 60 mg by mouth daily. 05/17/20   Arfeen, Arlyce Harman, MD  insulin glargine, 2 Unit Dial, (TOUJEO MAX SOLOSTAR)  300 UNIT/ML Solostar Pen Inject 110 Units into the skin every morning. Patient taking differently: Inject 110 Units into the skin in the morning.  04/11/20   Renato Shin, MD  insulin lispro (HUMALOG KWIKPEN) 100 UNIT/ML KwikPen Inject 0.03 mLs (3 Units total) into the skin 3 (three) times daily with meals. Take only if blood sugar is over 250.  Also pen needles 4/day 10/01/19   Renato Shin, MD  Insulin Syringe-Needle U-100 29G X 1/2" 0.3 ML MISC 1 each by Does not apply route 2 (two) times daily. 05/20/17   Nita Sells, MD  isosorbide mononitrate (IMDUR) 60 MG 24 hr tablet Take 1 tablet (60 mg total) by mouth daily. 02/05/20 08/03/20  Loel Dubonnet, NP  lamoTRIgine (LAMICTAL) 200 MG tablet Take 1 tablet (200 mg total) by mouth daily. 05/17/20   Arfeen, Arlyce Harman, MD  losartan (COZAAR) 100 MG tablet Take 1 tablet (100 mg total) by mouth daily. 01/18/20   Lucille Passy, MD  magic mouthwash SOLN Take 15 mLs by mouth 3 (three) times daily as needed for mouth pain. 01/18/20   Lucille Passy, MD  naloxone [facility]) nasal spray 4 mg/0.1 mL 4 mg (contents of 1 nasal spray) as a single dose in one nostril; may repeat every 2 to 3 minutes in alternating nostrils until medical assistance becomes available Patient not taking: Reported on 06/09/2020 12/25/18   Lucille Passy, MD  nicotine (NICODERM CQ) 14 mg/24hr patch Place 1 patch (14 mg total) onto the skin daily. 01/18/20   Lucille Passy, MD  nitroGLYCERIN (NITROSTAT) 0.4 MG SL tablet Place 1 tablet (0.4 mg total) under the tongue  every 5 (five) minutes as needed for chest pain. 01/06/20   Lucille Passy, MD  ondansetron (ZOFRAN) 4 MG tablet Take 1 tablet (4 mg total) by mouth daily as needed. 01/18/20   Lucille Passy, MD  oxyCODONE (OXY IR/ROXICODONE) 5 MG immediate release tablet Take 1 tablet (5 mg total) by mouth 2 (two) times daily as needed for severe pain. Must last 30 days. 04/27/20 05/27/20  Milinda Pointer, MD  oxyCODONE (OXY IR/ROXICODONE) 5 MG immediate release tablet Take 1 tablet (5 mg total) by mouth 2 (two) times daily as needed for severe pain. Must last 30 days. 05/27/20 06/26/20  Milinda Pointer, MD  oxyCODONE-acetaminophen (PERCOCET) 10-325 MG tablet Take 1 tablet by mouth every 6 (six) hours as needed for pain. Patient not taking: Reported on 06/09/2020 04/28/20 04/28/21  Vaughan Basta, Edman Circle, PA-C  pregabalin (LYRICA) 225 MG capsule Take 1 capsule (225 mg total) by mouth 3 (three) times daily. 02/23/20 08/21/20  Milinda Pointer, MD  spironolactone (ALDACTONE) 25 MG tablet Take 1 tablet (25 mg total) by mouth daily. 01/18/20   Lucille Passy, MD    Review of Systems    She denies chest pain, palpitations, dyspnea, pnd, orthopnea, n, v, dizziness, syncope, weight gain, or early satiety. She reports claudication.   All other systems reviewed and are otherwise negative except as noted above.  Physical Exam    VS:  BP (!) 144/98    Pulse (!) 105    Ht 5\' 7"  (1.702 m)    Wt 197 lb (89.4 kg)    BMI 30.85 kg/m  , BMI Body mass index is 30.85 kg/m. GEN: Well nourished, well developed, in no acute distress. HEENT: normal. Neck: Supple, no JVD, carotid bruits, or masses. Cardiac: RRR, 1/6 systolic murmur. No rubs, or gallops. No clubbing, cyanosis, no bilateral edema.  Radials/DP/PT 1+ bilaterally.Marland Kitchen  Respiratory:  Respirations regular and unlabored, bibasilar reduced breath sounds. GI: Soft, nontender, nondistended, BS + x 4. MS: no deformity or atrophy. Skin: warm and dry, no rash. Neuro:  Strength and sensation are  intact. Psych: Normal affect.  Accessory Clinical Findings    ECG personally reviewed by me today - NSR, 81bpm, no acute change from previous - no acute changes.  VITALS Reviewed today   Temp Readings from Last 3 Encounters:  01/02/21 (!) 97.3 F (36.3 C) (Temporal)  11/23/20 98.4 F (36.9 C) (Oral)  11/21/20 98.2 F (36.8 C) (Temporal)   BP Readings from Last 3 Encounters:  01/12/21 (!) 144/98  01/02/21 (!) 168/91  11/23/20 (!) 150/95   Pulse Readings from Last 3 Encounters:  01/12/21 (!) 105  01/02/21 92  11/23/20 90    Wt Readings from Last 3 Encounters:  01/12/21 197 lb (89.4 kg)  01/02/21 201 lb 3 oz (91.3 kg)  11/23/20 196 lb (88.9 kg)     LABS  reviewed today   Lab Results  Component Value Date   WBC 10.8 (H) 11/21/2020   HGB 15.5 (H) 11/21/2020   HCT 46.5 (H) 11/21/2020   MCV 89.9 11/21/2020   PLT 404.0 (H) 11/21/2020   Lab Results  Component Value Date   CREATININE 0.63 11/21/2020   BUN 6 11/21/2020   NA 135 11/21/2020   K 4.7 11/21/2020   CL 97 11/21/2020   CO2 32 11/21/2020   Lab Results  Component Value Date   ALT 36 (H) 11/21/2020   AST 16 11/21/2020   ALKPHOS 96 11/21/2020   BILITOT 0.3 11/21/2020   Lab Results  Component Value Date   CHOL 214 (H) 11/21/2020   HDL 42.80 11/21/2020   LDLCALC 80 09/07/2019   LDLDIRECT 112.0 11/21/2020   TRIG (H) 11/21/2020    488.0 Triglyceride is over 400; calculations on Lipids are invalid.   CHOLHDL 5 11/21/2020    Lab Results  Component Value Date   HGBA1C 10.3 (H) 11/21/2020   Lab Results  Component Value Date   TSH 1.26 11/21/2020     STUDIES/PROCEDURES reviewed today   08/2020 LHC grafts angio  Ost Cx to Prox Cx lesion is 100% stenosed.  Ost LAD to Prox LAD lesion is 85% stenosed.  Mid LAD lesion is 100% stenosed.  The left ventricular systolic function is normal.  LV end diastolic pressure is mildly elevated.  The left ventricular ejection fraction is 55-65% by visual  estimate.  Prox RCA lesion is 95% stenosed.  Mid RCA lesion is 100% stenosed.  SVG graft was visualized by angiography and is normal in caliber.  The graft exhibits no disease.  SVG and is normal in caliber.  The graft exhibits no disease.  2nd Diag lesion is 95% stenosed.  SVG graft was visualized by angiography and is normal in caliber.  The graft exhibits no disease.  LIMA and is normal in caliber.  The graft exhibits no disease.  Dist LAD-1 lesion is 60% stenosed.  Dist LAD-2 lesion is 40% stenosed. 1.  Severe underlying three-vessel coronary artery disease with patent grafts including LIMA to LAD, sequential vein graft to second diagonal/ second OM and SVG to right PDA.  The LAD distal to the LIMA anastomosis is relatively small in size and diffusely diseased.  No significant change from before. 2.  Normal LV systolic function mildly elevated left ventricular end-diastolic pressure. Recommendations: The patient has severe progression of native coronary artery disease.  However, all grafts are patent.  Recommend continuing medical therapy.   07/21/20 [name] LE arterial b/l  Summary:  Right: Total occlusion noted in the superficial femoral artery. Severe  progression is noted compared to previous study.  Left: Total occlusion noted in the proximal/mid superficial femoral  artery. Occlusion is also noted within the MID SFA stent. Severe  progression is noted compared to previous study.   07/21/20 ABI Summary:  Right: Resting right ankle-brachial index indicates moderate right lower  extremity arterial disease. The right toe-brachial index is abnormal.  Left: Resting left ankle-brachial index indicates severe left lower  extremity arterial disease. The left toe-brachial index is abnormal.   03/29/20 R carotid duplex Summary:  Right Carotid: Velocities in the right ICA are consistent with a 80-99%         stenosis. Non-hemodynamically significant plaque <50%  noted in         the CCA. Probable ECA Occlusion.  Vertebrals: Right vertebral artery demonstrates antegrade flow.  Subclavians: Right subclavian artery flow was disturbed.  03/21/20 US Carotid bilateral Summary:  Right Carotid: Velocities in the right ICA are consistent with a 80-99%         stenosis. Non-hemodynamically significant plaque <50% noted  in         the CCA. ICA stenosis has increased since prior study. ECA  not         seen, there is a question of occlusion.   Left Carotid: Velocities in the left ICA are consistent with a 40-59%  stenosis.        The ECA appears >50% stenosed. ICA stenosis has increased  since        prior study.   Vertebrals: Right vertebral artery demonstrates antegrade flow. Left  vertebral        artery demonstrates no discernable flow. ? occlusion of left        vertebral artery.  Subclavians: Normal flow hemodynamics were seen in bilateral subclavian        arteries.   07/2019 Abdominal aortogram with LE 1.  No significant aortoiliac disease. 2.  Left lower extremity: Patent mid to distal SFA stent.  However, the proximal portion is now diffusely diseased and in addition, the SFA is occluded distal to the stent which is new.  Also the anterior tibial artery is diffusely diseased and occluded distally and the peroneal artery is also diffusely diseased. Recommendations: Unfortunately, the patient seems to have very aggressive disease.  Endovascular revascularization of the left SFA will require working on the whole vessel with a high chance of reocclusion especially in the setting of continued tobacco use.  The best option at the present time is likely medical therapy and a structured walking program through cardiac rehab.  Long-term if revascularization is required, femoral-popliteal bypass likely has better long-term outcome but that should be left as a last resort. I am going  to switch the patient from Plavix to Pletal for symptomatic relief. I strongly advised the patient to quit smoking.  08/29/2016 Cath  Ost LM to LM lesion, 30 %stenosed.  Ost Cx to Prox Cx lesion, 60 %stenosed.  Ost LAD lesion, 50 %stenosed.  Ost 1st Diag to 1st Diag lesion, 60 %stenosed.  Prox LAD lesion, 95 %stenosed.  Prox Cx lesion, 90 %stenosed.  Mid Cx lesion, 60 %stenosed.  Mid RCA lesion, 100 %stenosed.  Seq SVG- om1 graft was visualized by angiography and is normal in caliber.  The graft exhibits minimal luminal irregularities.  Seq SVG-  graft was visualized by angiography and is normal in caliber and anatomically normal.  1st Diag lesion, 50 %stenosed.  SVG and is normal in caliber and anatomically normal.  LIMA and is normal in caliber and anatomically normal.  Mid LAD to Dist LAD lesion, 70 %stenosed.  The left ventricular systolic function is normal.  LV end diastolic pressure is mildly elevated.  The left ventricular ejection fraction is 55-65% by visual estimate. 1. Severe underlying three-vessel coronary artery disease with patent grafts including SVG to right PDA, sequential vein graft to diagonal 1/ OM 1 and LIMA to LAD. The LIMA makes an unusual turn distally before the anastomosis with diffuse disease in the LAD distal to the anastomosis. The vessel is overall small in caliber with the dominant vessel being the large diagonal branch. 2. Normal LV systolic function and mildly elevated left ventricular end-diastolic pressure. Recommendations: Continue aggressive medical therapy. I suspect that her symptoms were likely related to uncontrolled hypertension. I increased the dose of carvedilol to 25 mg twice daily. I agree with the addition of small dose long acting nitroglycerin as well. Monitor overnight and possible discharge home tomorrow.  Intraoperative TEE 07/06/2016 - Left ventricle: Systolic function was mildly reduced. The  estimated ejection  fraction was in the range of 45% to 50%.  - Mitral valve: There was mild regurgitation.  - Left atrium: No evidence of thrombus in the atrial cavity or  appendage.  - Right atrium: No evidence of thrombus in the atrial cavity or  appendage.  - Atrial septum: No defect or patent foramen ovale was identified.  - Pulmonic valve: No evidence of vegetation.   Assessment & Plan   CAD s/p CABG involving the native cors with stable angina --No report of angina. S/p CABG 06/2016. Recent 08/2020 cath with progression of native CAD but patent grafts. Continue long acting nitroglycerine. Given elevated HR and pressure today, will increase to Coreg 37.5mg  BID.  Aggressive risk factor modification. Given LDL now significantly elevated from previous lab / very uncontrolled LDL, recommended referral to Dr. Debara Pickett at the lipid clinic and nutiritonalist/dietician for additional support. Lifestyle changes and medication management reviewed in detail.   AOC HFpEF --Reports DOE and abdominal distention today. BP uncontrolled at 144/98.. Wt increased 5 lbs from 08/2020. LHC shows nl LVEF with mildly elevated LVEDP.  As discussed with DOD, will start torsemide 20mg  qd with BMET today and in 1 week. Increase to Coreg 37.5mg  BID. Continue losartan and aldactone. BMET today and in 1 week.  HTN, goal BP <130/80 --Long history of difficult to control BP. Previous RAS workup unrevealing. Reports DOE today, as well as abdominal distention; therefore, started on a diuretic today to see if this helps to alleviate the recently noted elevated BP in clinic. As discussed with DOD, will start torsemide 20mg  qd with BMET today and in 1-2 weeks Increase to Coreg 37.5mg  BID. Continue amlodipine 10mg , clonidine 0.1mg  BID, Imdur, losartan, aldactone. She will be a candidate for renal denervation if therapy becomes approved.  Continue to monitor BP / wt at home.  HLD, goal LDL <70 --labs 10/2020 with LDL 112.0 and total cholesterol 214  with Tg significantly elevated at 488.0. Referral provided to lipid clinic / Dr. Debara Pickett, given she continues on Repatha and previously closer to goal LDL. Continue newly started simvastatin 20mg  daily, in addition to [place]. Repeat lipid and liver recommended in 6-8 weeks. Previous ALT 36 and will monitor. She is also being followed by endocrinology and review of  EMR shows these labs were forwarded to endocrinology as well.  Continue to recommend walking program.  Hypertriglyceridemia --Poorly controlled. Could consider addition of Vascepa. Further recommendations and management per endocrinology.Caution with elevated Tg to avoid Tg induced pancreatitis.  PAD with bilateral occlusion of SFA and calf claudication --Ongoing claudication reported with pt only able to ambulate short distances. Claudication interfering with lifestyle. Unable to ambulate to mailbox without pain. --Continue ASA, Plavix, Repatha, simvastatin.   --Recheck LDL/LFTs 6-8 weeks s/p start of Zorcor.  --Referral to Dr. Debara Pickett for further evaluation.  --Smoking cessation, walking program.  --Intervention for refractory sx. --Pt will contact endocrinology to ensure adequately treating paresthesias.   Carotid dz s/p R carotid endarterectomy --Severe dz of R carotid s/p R carotid endarterectomy with recurrent severe dz and redo endarterectomy of R side by Dr. Donnetta Hutching this yar. Refer to lipid clinic, dietician. Continue statin and DAPT / cholesterol control. Smoking cessation.   Tobacco use --Continue to work on quitting smoking. Complete cessation recommended.  DM2 --Hgb A1C poorly controlled at 10.3. Recommend further workup and management per endocrinology. Agree with recently started [place]. Continue SSI as indicated. Optimal glycemic control recommended.    Arvil Chaco, PA-C

## 2021-01-14 ENCOUNTER — Other Ambulatory Visit: Payer: Self-pay | Admitting: Endocrinology

## 2021-01-14 NOTE — Telephone Encounter (Signed)
1.  Please schedule f/u appt 2.  Then please refill x 2 mos, pending that appt.  

## 2021-01-18 ENCOUNTER — Other Ambulatory Visit: Payer: Self-pay | Admitting: Endocrinology

## 2021-01-24 ENCOUNTER — Other Ambulatory Visit: Payer: Self-pay

## 2021-01-24 ENCOUNTER — Other Ambulatory Visit
Admission: RE | Admit: 2021-01-24 | Discharge: 2021-01-24 | Disposition: A | Discharge status: Home / Self Care | Payer: Managed Care, Other (non HMO) | Source: Ambulatory Visit | Attending: Physician Assistant | Admitting: Physician Assistant

## 2021-01-24 DIAGNOSIS — E0859 Diabetes mellitus due to underlying condition with other circulatory complications: Secondary | ICD-10-CM | POA: Insufficient documentation

## 2021-01-24 DIAGNOSIS — E0865 Diabetes mellitus due to underlying condition with hyperglycemia: Secondary | ICD-10-CM | POA: Insufficient documentation

## 2021-01-24 DIAGNOSIS — Z794 Long term (current) use of insulin: Secondary | ICD-10-CM | POA: Diagnosis present

## 2021-01-24 DIAGNOSIS — IMO0002 Reserved for concepts with insufficient information to code with codable children: Secondary | ICD-10-CM

## 2021-01-24 LAB — BASIC METABOLIC PANEL
Anion gap: 14 (ref 5–15)
BUN: 7 mg/dL (ref 6–20)
CO2: 26 mmol/L (ref 22–32)
Calcium: 10.1 mg/dL (ref 8.9–10.3)
Chloride: 96 mmol/L — ABNORMAL LOW (ref 98–111)
Creatinine, Ser: 0.77 mg/dL (ref 0.44–1.00)
GFR, Estimated: >60 mL/min (ref >60)
Glucose, Bld: 202 mg/dL — ABNORMAL HIGH (ref 70–99)
Potassium: 4.4 mmol/L (ref 3.5–5.1)
Sodium: 136 mmol/L (ref 135–145)

## 2021-01-25 ENCOUNTER — Telehealth: Payer: Self-pay | Admitting: *Deleted

## 2021-01-25 DIAGNOSIS — Z79899 Other long term (current) drug therapy: Secondary | ICD-10-CM

## 2021-01-25 DIAGNOSIS — E785 Hyperlipidemia, unspecified: Secondary | ICD-10-CM

## 2021-01-25 NOTE — Telephone Encounter (Signed)
Spoke to pt. Notified of results and provider's recc.  Pt verbalized understanding and will monitor HR and BP and reach out to her endocrinologist regarding glucose.  Pt will repeat lipid and liver panel in 6-8 weeks at the medical mall at [facility]. Orders placed.  Pt has no further questions at this time.

## 2021-01-25 NOTE — Telephone Encounter (Signed)
-----   Message from Arvil Chaco, PA-C sent at 01/24/2021 11:17 PM EST ----- Good news! --Renal function stable and potassium within normal range on torsemide.  Recommendations:  Monitor HR and BP (hopefully, both are better controlled since our medication changes in clinic). Would recommend reach out to her endocrinologist (if haven't already) to ensure glucose controlled.  Repeat lipid and liver function in 6-8 weeks.

## 2021-02-09 ENCOUNTER — Other Ambulatory Visit: Payer: Self-pay

## 2021-02-09 ENCOUNTER — Ambulatory Visit (INDEPENDENT_AMBULATORY_CARE_PROVIDER_SITE_OTHER): Payer: Managed Care, Other (non HMO) | Admitting: Cardiovascular Disease

## 2021-02-09 ENCOUNTER — Encounter: Payer: Self-pay | Admitting: Cardiovascular Disease

## 2021-02-09 VITALS — BP 180/110 | HR 96 | Ht 67.0 in | Wt 192.5 lb

## 2021-02-09 DIAGNOSIS — I739 Peripheral vascular disease, unspecified: Secondary | ICD-10-CM | POA: Diagnosis not present

## 2021-02-09 DIAGNOSIS — E785 Hyperlipidemia, unspecified: Secondary | ICD-10-CM

## 2021-02-09 DIAGNOSIS — I10 Essential (primary) hypertension: Secondary | ICD-10-CM | POA: Diagnosis not present

## 2021-02-09 DIAGNOSIS — Z9889 Other specified postprocedural states: Secondary | ICD-10-CM

## 2021-02-09 DIAGNOSIS — I25758 Atherosclerosis of native coronary artery of transplanted heart with other forms of angina pectoris: Secondary | ICD-10-CM | POA: Diagnosis not present

## 2021-02-09 DIAGNOSIS — Z720 Tobacco use: Secondary | ICD-10-CM

## 2021-02-09 MED ORDER — ROSUVASTATIN CALCIUM 20 MG PO TABS: 20.0000 mg | ORAL_TABLET | Freq: Every day | ORAL | 11 refills | Status: DC

## 2021-02-09 NOTE — Patient Instructions (Signed)
Medication Instructions:  Your physician has recommended you make the following change in your medication:   1) STOP Simvastatin  2) START Rosuvastatin (crestor) 20 mg daily. An Rx has been sent to your pharmacy.  *If you need a refill on your cardiac medications before your next appointment, please call your pharmacy*   Lab Work: Your physician recommends that you return for a FASTING lipid profile and lft in 2 months.  Please have your labs drawn at the [facility] medical mall. You do not need an appt. Lab hours are Mon-Fri 7am-6pm.   If you have labs (blood work) drawn today and your tests are completely normal, you will receive your results only by: Marland Kitchen MyChart Message (if you have MyChart) OR . A paper copy in the mail If you have any lab test that is abnormal or we need to change your treatment, we will call you to review the results.   Testing/Procedures: None ordered   Follow-Up: At [facility], you and your health needs are our priority.  As part of our continuing mission to provide you with exceptional heart care, we have created designated Provider Care Teams.  These Care Teams include your primary Cardiologist (physician) and Advanced Practice Providers (APPs -  Physician Assistants and Nurse Practitioners) who all work together to provide you with the care you need, when you need it.  We recommend signing up for the patient portal called "MyChart".  Sign up information is provided on this After Visit Summary.  MyChart is used to connect with patients for Virtual Visits (Telemedicine).  Patients are able to view lab/test results, encounter notes, upcoming appointments, etc.  Non-urgent messages can be sent to your provider as well.   To learn more about what you can do with MyChart, go to [email or web address].    Your next appointment:   6 month(s)  The format for your next appointment:   In Person  Provider:   You may see [name], MD or one of the following  Advanced Practice Providers on your designated Care Team:    Murray Hodgkins, NP  Christell Faith, PA-C  Marrianne Mood, PA-C  Cadence Oneonta, [place]  Laurann Montana, NP    Other Instructions [name]

## 2021-02-09 NOTE — Progress Notes (Signed)
Cardiology Office Note   Date:  02/09/2021   ID:  Erika [name], DOB 1971-03-31, MRN [id number]  PCP:  Lesleigh Noe, MD Cardiologist:   [name], MD   Chief Complaint  Patient presents with  . PHQ-9 4 Week Follow-up    "doing well." Medications reviewed by the patient verbally.       History of Present Illness: Erika [name] is a 50 y.o. female who presents for a follow-up visit  regarding extensive cardiovascular history. She has known history of coronary artery disease with previous non-ST elevation myocardial infarction in 2017.  She was found to have three-vessel coronary artery disease at that time and underwent CABG and right carotid endarterectomy. She was rehospitalized in September, 2017 with chest pain in the setting of uncontrolled hypertension. Cardiac catheterization showed patent grafts . The LAD had diffuse disease distal to the anastomosis and was very small in caliber. Ejection fraction was normal with mildly elevated left ventricular end-diastolic pressure.   She has history of difficult to control hypertension with labile blood pressure.  No evidence of renal artery stenosis on previous angiography.  She has known history of severe mixed hyperlipidemia .  She is known to have peripheral arterial disease with previous stenting of the left SFA. She was subsequently found to have occluded left SFA.  She subsequently had right leg claudication and was found to have an occluded right SFA.    She underwent repeat cardiac catheterization in September 2021 due to worsening angina.  Cardiac cath showed progression of native coronary artery disease but patent grafts with no obstructive disease.  Medical therapy was recommended.    She denies chest pain or shortness of breath.  She continues to have severe bilateral calf claudication.  Unfortunately, she continues to smoke less than 1 pack/day.  She is under significant stress as her 44 year old daughter  has kidney failure and was placed on the kidney transplant list recently.  Past Medical History:  Diagnosis Date  . Arthralgia of temporomandibular joint   . CAD, multiple vessel    a. cath 06/29/16: ostLM 40%, ostLAD 40%, pLAD 95%, ost-pLCx 60%, pLCx 95%, mLCx 60%, mRCA 95%, D2 50%, LVSF nl;  b. 07/2016 CABG x 4 (LIMA->LAD, VG->Diag, VG->OM, VG->RCA); c. 08/2016 Cath: 3VD w/ 4/4 patent grafts. LAD distal to LIMA has diff dzs->Med rx.  . Carotid arterial disease ([place])    a. 07/2016 s/p R CEA.  . Clotting disorder ([place])   . Depression   . Diastolic dysfunction    a. echo 06/28/16: EF 50-55%, mild inf wall HK, GR1DD, mild MR, RV sys fxn nl, mildly dilated LA, PASP nl  . Fatty liver disease, nonalcoholic 2725  . History of blood transfusion    with heart surgery  . HLD (hyperlipidemia)   . Labile hypertension    a. prior renal ngiogram negative for RAS in 03/2016; b. catecholamines and metanephrines normal, mildly elevated renin with normal aldosterone and normal ratio in 02/2016  . Myocardial infarction ([place]) 2017  . Obesity   . PAD (peripheral artery disease) ([facility])    in both legs  . PTSD (post-traumatic stress disorder)   . Tobacco abuse    In 2018 - pt cut back from 2 ppd to 0.5 ppd.- still 1/2 ppd as of 04/22/20  . Type 2 diabetes mellitus [facility]) January 2017    Past Surgical History:  Procedure Laterality Date  . ABDOMINAL AORTOGRAM W/LOWER EXTREMITY [name] 10/15/2018   Procedure: ABDOMINAL AORTOGRAM  W/LOWER EXTREMITY;  Surgeon: Wellington Hampshire, MD;  Location: [place] [facility];  Service: Cardiovascular;  Laterality: [name];  . ABDOMINAL AORTOGRAM W/LOWER EXTREMITY Bilateral 08/19/2019   Procedure: ABDOMINAL AORTOGRAM W/LOWER EXTREMITY;  Surgeon: Wellington Hampshire, MD;  Location: [place] [facility];  Service: Cardiovascular;  Laterality: Bilateral;  . CARDIAC CATHETERIZATION [name] 06/29/2016   Procedure: Left Heart Cath and Coronary Angiography;  Surgeon: Minna Merritts, MD;  Location: [place] [facility];  Service: Cardiovascular;  Laterality: [name];  . CARDIAC CATHETERIZATION [name] 08/29/2016   Procedure: Left Heart Cath and Cors/Grafts Angiography;  Surgeon: Wellington Hampshire, MD;  Location: [place] [facility];  Service: Cardiovascular;  Laterality: [name];  . CESAREAN SECTION    . CHOLECYSTECTOMY    . CORONARY ARTERY BYPASS GRAFT [name] 07/06/2016   Procedure: CORONARY ARTERY BYPASS GRAFTING (CABG) x four, using left internal mammary artery and right leg greater saphenous vein harvested endoscopically;  Surgeon: Ivin Poot, MD;  Location: [place];  Service: Open Heart Surgery;  Laterality: [name];  . ENDARTERECTOMY Right 07/06/2016   Procedure: ENDARTERECTOMY CAROTID;  Surgeon: Rosetta Posner, MD;  Location: [place];  Service: Vascular;  Laterality: Right;  . ENDARTERECTOMY Right 04/27/2020   Procedure: REDO OF RIGHT ENDARTERECTOMY CAROTID;  Surgeon: Rosetta Posner, MD;  Location: [place];  Service: Vascular;  Laterality: Right;  . LEFT HEART CATH AND CORS/GRAFTS ANGIOGRAPHY [name] 08/24/2020   Procedure: LEFT HEART CATH AND CORS/GRAFTS ANGIOGRAPHY;  Surgeon: Wellington Hampshire, MD;  Location: [place] [facility];  Service: Cardiovascular;  Laterality: [name];  . PERIPHERAL VASCULAR CATHETERIZATION [name] 04/18/2016   Procedure: Renal Angiography;  Surgeon: Wellington Hampshire, MD;  Location: [place] [facility];  Service: Cardiovascular;  Laterality: [name];  . PERIPHERAL VASCULAR INTERVENTION Left 10/15/2018   Procedure: PERIPHERAL VASCULAR INTERVENTION;  Surgeon: Wellington Hampshire, MD;  Location: [place] [facility];  Service: Cardiovascular;  Laterality: Left;  Left superficial femoral  . TEE WITHOUT CARDIOVERSION [name] 07/06/2016   Procedure: TRANSESOPHAGEAL ECHOCARDIOGRAM (TEE);  Surgeon: Ivin Poot, MD;  Location: [place];  Service: Open Heart Surgery;  Laterality: [name];  . TONSILLECTOMY       Current Outpatient Medications  Medication Sig Dispense Refill  . albuterol (PROVENTIL HFA;VENTOLIN HFA) 108 (90 Base)  MCG/ACT inhaler Inhale 2 puffs into the lungs every 6 (six) hours as needed for wheezing. 1 Inhaler 0  . amLODipine (NORVASC) 10 MG tablet Take 1 tablet (10 mg total) by mouth daily. 90 tablet 0  . aspirin EC 81 MG tablet Take 1 tablet (81 mg total) by mouth daily. 90 tablet 3  . benztropine (COGENTIN) 0.5 MG tablet Take 1 tablet (0.5 mg total) by mouth at bedtime. 90 tablet 0  . carvedilol (COREG) 25 MG tablet Take 1.5 tablets (37.5 mg total) by mouth 2 (two) times daily with a meal. 90 tablet 5  . chlorproMAZINE (THORAZINE) 100 MG tablet TAKE 1 TABLET BY MOUTH EVERYDAY AT BEDTIME 90 tablet 0  . clonazePAM (KLONOPIN) 0.5 MG tablet Take 1 tablet (0.5 mg total) by mouth 3 (three) times daily as needed for anxiety. 90 tablet 2  . cloNIDine (CATAPRES) 0.1 MG tablet Take 1 tablet (0.1 mg total) by mouth 2 (two) times daily. 180 tablet 0  . clopidogrel (PLAVIX) 75 MG tablet TAKE 1 TABLET BY MOUTH EVERY DAY 30 tablet 3  . FARXIGA 5 MG TABS tablet TAKE 5 MG BY MOUTH DAILY BEFORE BREAKFAST. 30 tablet 0  .  FLUoxetine HCl 60 MG TABS Take 60 mg by mouth daily. 90 tablet 0  . insulin glargine, 2 Unit Dial, (TOUJEO MAX SOLOSTAR) 300 UNIT/ML Solostar Pen Inject 110 Units into the skin every morning. 4 pen 11  . insulin glargine, 2 Unit Dial, (TOUJEO MAX SOLOSTAR) 300 UNIT/ML Solostar Pen Inject 110 Units into the skin daily.     . insulin lispro (HUMALOG KWIKPEN) 100 UNIT/ML KwikPen Inject 3 Units into the skin 3 (three) times daily with meals. Take only if blood sugar is over 250.  Also pen needles 4/day 15 mL 1  . Insulin Syringe-Needle U-100 29G X 1/2" 0.3 ML MISC 1 each by Does not apply route 2 (two) times daily. 30 each 0  . isosorbide mononitrate (IMDUR) 60 MG 24 hr tablet Take 1 tablet by mouth am and 0.5 tablet by mouth pm daily. 135 tablet 0  . lamoTRIgine (LAMICTAL) 200 MG tablet Take 1 tablet (200 mg total) by mouth at bedtime. 90 tablet 0  . losartan (COZAAR) 100 MG tablet Take 1 tablet (100 mg  total) by mouth daily. 90 tablet 0  . naloxone (NARCAN) nasal spray 4 mg/0.1 mL 4 mg (contents of 1 nasal spray) as a single dose in one nostril; may repeat every 2 to 3 minutes in alternating nostrils until medical assistance becomes available (Patient taking differently: Place 1 spray into the nose See admin instructions. 4 mg (contents of 1 nasal spray) as a single dose in one nostril; may repeat every 2 to 3 minutes in alternating nostrils until medical assistance becomes available) 1 kit 0  . nicotine (NICODERM CQ - DOSED IN MG/24 HOURS) 14 mg/24hr patch Place 14 mg onto the skin daily.     . nitroGLYCERIN (NITROSTAT) 0.4 MG SL tablet Place 1 tablet (0.4 mg total) under the tongue every 5 (five) minutes as needed for chest pain. 25 tablet 98  . ondansetron (ZOFRAN) 4 MG tablet Take 1 tablet (4 mg total) by mouth daily as needed. (Patient taking differently: Take 4 mg by mouth daily as needed for nausea or vomiting.) 20 tablet 11  . oxyCODONE (OXY IR/ROXICODONE) 5 MG immediate release tablet Take 1 tablet (5 mg total) by mouth 2 (two) times daily as needed for severe pain. Must last 30 days. 60 tablet 0  . pregabalin (LYRICA) 150 MG capsule Take 1 capsule (150 mg total) by mouth daily for 7 days, THEN 1 capsule (150 mg total) 2 (two) times daily for 7 days, THEN 1 capsule (150 mg total) 3 (three) times daily for 7 days. 42 capsule 0  . pregabalin (LYRICA) 225 MG capsule Take 1 capsule (225 mg total) by mouth 2 (two) times daily. 60 capsule 2  . REPATHA SURECLICK 419 MG/ML SOAJ INJECT 1 PEN INTO THE SKIN EVERY 14 (FOURTEEN) DAYS. 2 mL 3  . simvastatin (ZOCOR) 20 MG tablet Take 1 tablet (20 mg total) by mouth daily. 90 tablet 0  . spironolactone (ALDACTONE) 25 MG tablet Take 1 tablet (25 mg total) by mouth daily. 90 tablet 0  . tobramycin-dexamethasone (TOBRADEX) ophthalmic solution Place 2 drops into the left eye every 4 (four) hours while awake. 5 mL 0  . torsemide (DEMADEX) 20 MG tablet Take 1  tablet (20 mg total) by mouth 2 (two) times daily. 30 tablet 5  . oxyCODONE (OXY IR/ROXICODONE) 5 MG immediate release tablet Take 1 tablet (5 mg total) by mouth 2 (two) times daily as needed for severe pain. Must last 30 days. [place]  tablet 0  . oxyCODONE (OXY IR/ROXICODONE) 5 MG immediate release tablet Take 1 tablet (5 mg total) by mouth 2 (two) times daily as needed for severe pain. Must last 30 days. 60 tablet 0   No current facility-administered medications for this visit.    Allergies:   Chantix [varenicline tartrate]    Social History:  The patient  reports that she has been smoking cigarettes. She has a 13.50 pack-year smoking history. She has never used smokeless tobacco. She reports previous alcohol use. She reports that she does not use drugs.   Family History:  The patient's family history includes Alcohol abuse in her father; Anxiety disorder in her sister; Diabetes in her father and mother; Drug abuse in her father; Heart disease in her father; Stroke in her sister. She was adopted.    ROS:  Please see the history of present illness.   Otherwise, review of systems are positive for none.   All other systems are reviewed and negative.    PHYSICAL EXAM: VS:  BP (!) 180/110 (BP Location: Left Arm, Patient Position: Sitting, Cuff Size: Normal)   Pulse 96   Ht $R'5\' 7"'ko$  (1.702 m)   Wt 192 lb 8 oz (87.3 kg)   SpO2 97%   BMI 30.15 kg/m  , BMI Body mass index is 30.15 kg/m. GEN: Well nourished, well developed, in no acute distress  HEENT: normal  Neck: no JVD, carotid bruits, or masses Cardiac: RRR; no  rubs, or gallops,no edema .  No murmurs. Respiratory:  clear to auscultation bilaterally, normal work of breathing GI: soft, nontender, nondistended, + BS MS: no deformity or atrophy  Skin: warm and dry, no rash Neuro:  Strength and sensation are intact Psych: euthymic mood, full affect Right groin is intact with no hematoma.  EKG:  EKG is not ordered today.     Recent  Labs: 11/21/2020: ALT 36; Hemoglobin 15.5; Platelets 404.0; TSH 1.26 01/24/2021: BUN 7; Creatinine, Ser 0.77; Potassium 4.4; Sodium 136    Lipid Panel    Component Value Date/Time   CHOL 214 (H) 11/21/2020 1514   CHOL 121 08/21/2016 0817   TRIG (H) 11/21/2020 1514    488.0 Triglyceride is over 400; calculations on Lipids are invalid.   HDL 42.80 11/21/2020 1514   HDL 24 (L) 08/21/2016 0817   CHOLHDL 5 11/21/2020 1514   VLDL 38.2 09/07/2019 0846   LDLCALC 80 09/07/2019 0846   LDLCALC 64 08/21/2016 0817   LDLDIRECT 112.0 11/21/2020 1514      Wt Readings from Last 3 Encounters:  02/09/21 192 lb 8 oz (87.3 kg)  01/12/21 197 lb (89.4 kg)  01/02/21 201 lb 3 oz (91.3 kg)         ASSESSMENT AND PLAN:  1.  Coronary artery disease involving native coronary arteries with stable angina: Status post CABG in July of 2017.  Cardiac catheterization last year showed progression of native coronary artery disease but patent grafts.  Continue long-acting nitroglycerin and a beta-blocker.  2. Peripheral arterial disease with severe bilateral calf claudication with bilateral SFA occlusion: I recommend maximizing medical therapy, quitting smoking and a walking exercise program.  Revascularization should be reserved for refractory symptoms.  I think there is high risk for endovascular or surgical revascularization failure given her continued uncontrolled risk factors and ongoing tobacco use.  3. Carotid artery disease status post right carotid endarterectomy.  Status post right carotid endarterectomy twice on the right.  Followed by Dr. Donnetta Hutching  4. Essential hypertension:  Blood pressure continues to be significantly elevated in spite of multiple blood pressure medications.  Continue current medications..  She will be a candidate for renal denervation if the therapy becomes approved.  5. Hyperlipidemia: Continue treatment with Repatha.  Most recent lipid profile showed an LDL of 110.  I elected to  switch simvastatin to rosuvastatin 20 mg daily.  Recheck lipid and liver profile in 2 months.  6. Tobacco use: She is trying to quit smoking.  I again discussed with her the importance of cessation.    Disposition:   FU with me in 6 months   Signed,  [name], MD  02/09/2021 9:19 AM    [place]

## 2021-02-11 ENCOUNTER — Other Ambulatory Visit (HOSPITAL_COMMUNITY): Payer: Self-pay | Admitting: Psychiatry

## 2021-02-11 DIAGNOSIS — F331 Major depressive disorder, recurrent, moderate: Secondary | ICD-10-CM

## 2021-02-13 ENCOUNTER — Ambulatory Visit: Payer: Managed Care, Other (non HMO) | Admitting: Vascular Surgery

## 2021-02-14 ENCOUNTER — Other Ambulatory Visit: Payer: Self-pay

## 2021-02-14 ENCOUNTER — Telehealth (INDEPENDENT_AMBULATORY_CARE_PROVIDER_SITE_OTHER): Payer: 59 | Admitting: Psychiatry

## 2021-02-14 ENCOUNTER — Encounter (HOSPITAL_COMMUNITY): Payer: Self-pay | Admitting: Psychiatry

## 2021-02-14 DIAGNOSIS — F331 Major depressive disorder, recurrent, moderate: Secondary | ICD-10-CM

## 2021-02-14 DIAGNOSIS — F4310 Post-traumatic stress disorder, unspecified: Secondary | ICD-10-CM

## 2021-02-14 DIAGNOSIS — F410 Panic disorder [episodic paroxysmal anxiety] without agoraphobia: Secondary | ICD-10-CM | POA: Diagnosis not present

## 2021-02-14 MED ORDER — CHLORPROMAZINE HCL 100 MG PO TABS: ORAL_TABLET | ORAL | 0 refills | Status: DC

## 2021-02-14 MED ORDER — FLUOXETINE HCL 60 MG PO TABS: 60.0000 mg | ORAL_TABLET | Freq: Every day | ORAL | 0 refills | Status: DC

## 2021-02-14 MED ORDER — CLONAZEPAM 0.5 MG PO TABS
0.5000 mg | ORAL_TABLET | Freq: Three times a day (TID) | ORAL | 2 refills | Status: DC | PRN
Start: 2021-02-14 — End: 2021-05-11

## 2021-02-14 MED ORDER — BENZTROPINE MESYLATE 0.5 MG PO TABS: 0.5000 mg | ORAL_TABLET | Freq: Every day | ORAL | 0 refills | Status: DC

## 2021-02-14 MED ORDER — LAMOTRIGINE 200 MG PO TABS: 200.0000 mg | ORAL_TABLET | Freq: Every day | ORAL | 0 refills | Status: DC
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
## 2021-02-17 ENCOUNTER — Ambulatory Visit: Payer: Managed Care, Other (non HMO) | Admitting: Dietician
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
## 2021-02-20 ENCOUNTER — Other Ambulatory Visit: Payer: Self-pay

## 2021-02-20 ENCOUNTER — Ambulatory Visit: Payer: Managed Care, Other (non HMO) | Attending: Pain Medicine | Admitting: Pain Medicine

## 2021-02-20 ENCOUNTER — Encounter: Payer: Self-pay | Admitting: Pain Medicine

## 2021-02-20 VITALS — BP 233/135 | HR 107 | Temp 97.4°F | Resp 18 | Ht 67.0 in | Wt 196.0 lb

## 2021-02-20 DIAGNOSIS — G894 Chronic pain syndrome: Secondary | ICD-10-CM

## 2021-02-20 DIAGNOSIS — G629 Polyneuropathy, unspecified: Secondary | ICD-10-CM

## 2021-02-20 DIAGNOSIS — R519 Headache, unspecified: Secondary | ICD-10-CM

## 2021-02-20 DIAGNOSIS — M7602 Gluteal tendinitis, left hip: Secondary | ICD-10-CM

## 2021-02-20 DIAGNOSIS — Z79899 Other long term (current) drug therapy: Secondary | ICD-10-CM | POA: Diagnosis present

## 2021-02-20 DIAGNOSIS — R6884 Jaw pain: Secondary | ICD-10-CM

## 2021-02-20 DIAGNOSIS — G8929 Other chronic pain: Secondary | ICD-10-CM | POA: Diagnosis present

## 2021-02-20 DIAGNOSIS — B0221 Postherpetic geniculate ganglionitis: Secondary | ICD-10-CM | POA: Diagnosis present

## 2021-02-20 DIAGNOSIS — M25552 Pain in left hip: Secondary | ICD-10-CM | POA: Diagnosis present

## 2021-02-20 DIAGNOSIS — F1120 Opioid dependence, uncomplicated: Secondary | ICD-10-CM | POA: Diagnosis present

## 2021-02-20 MED ORDER — OXYCODONE HCL 5 MG PO TABS
5.0000 mg | ORAL_TABLET | Freq: Two times a day (BID) | ORAL | 0 refills | Status: DC | PRN
Start: 2021-04-24 — End: 2021-02-23

## 2021-02-20 MED ORDER — PREGABALIN 225 MG PO CAPS: 225.0000 mg | ORAL_CAPSULE | Freq: Two times a day (BID) | ORAL | 2 refills | Status: DC

## 2021-02-20 MED ORDER — OXYCODONE HCL 5 MG PO TABS: 5.0000 mg | ORAL_TABLET | Freq: Two times a day (BID) | ORAL | 0 refills | Status: DC | PRN

## 2021-02-20 MED ORDER — PREGABALIN 225 MG PO CAPS
225.0000 mg | ORAL_CAPSULE | Freq: Two times a day (BID) | ORAL | 2 refills | Status: DC
Start: 2021-03-13 — End: 2021-02-20

## 2021-02-20 MED ORDER — OXYCODONE HCL 5 MG PO TABS
5.0000 mg | ORAL_TABLET | Freq: Two times a day (BID) | ORAL | 0 refills | Status: DC | PRN
Start: 2021-02-23 — End: 2021-02-23

## 2021-02-20 NOTE — Patient Instructions (Addendum)
____________________________________________________________________________________________  Medication Rules  Purpose: To inform patients, and their family members, of our rules and regulations.  Applies to: All patients receiving prescriptions (written or electronic).  Pharmacy of record: Pharmacy where electronic prescriptions will be sent. If written prescriptions are taken to a different pharmacy, please inform the nursing staff. The pharmacy listed in the electronic medical record should be the one where you would like electronic prescriptions to be sent.  Electronic prescriptions: In compliance with the [place] Strengthen Opioid Misuse Prevention (STOP) Act of 2017 (Session Law 2017-74/H243), effective December 24, 2018, all controlled substances must be electronically prescribed. Calling prescriptions to the pharmacy will cease to exist.  Prescription refills: Only during scheduled appointments. Applies to all prescriptions.  NOTE: The following applies primarily to controlled substances (Opioid* Pain Medications).   Type of encounter (visit): For patients receiving controlled substances, face-to-face visits are required. (Not an option or up to the patient.)  Patient's responsibilities: 1. Pain Pills: Bring all pain pills to every appointment (except for procedure appointments). 2. Pill Bottles: Bring pills in original pharmacy bottle. Always bring the newest bottle. Bring bottle, even if empty. 3. Medication refills: You are responsible for knowing and keeping track of what medications you take and those you need refilled. The day before your appointment: write a list of all prescriptions that need to be refilled. The day of the appointment: give the list to the admitting nurse. Prescriptions will be written only during appointments. No prescriptions will be written on procedure days. If you forget a medication: it will not be "Called in", "Faxed", or "electronically sent".  You will need to get another appointment to get these prescribed. No early refills. Do not call asking to have your prescription filled early. 4. Prescription Accuracy: You are responsible for carefully inspecting your prescriptions before leaving our office. Have the discharge nurse carefully go over each prescription with you, before taking them home. Make sure that your name is accurately spelled, that your address is correct. Check the name and dose of your medication to make sure it is accurate. Check the number of pills, and the written instructions to make sure they are clear and accurate. Make sure that you are given enough medication to last until your next medication refill appointment. 5. Taking Medication: Take medication as prescribed. When it comes to controlled substances, taking less pills or less frequently than prescribed is permitted and encouraged. Never take more pills than instructed. Never take medication more frequently than prescribed.  6. Inform other Doctors: Always inform, all of your healthcare providers, of all the medications you take. 7. Pain Medication from other Providers: You are not allowed to accept any additional pain medication from any other Doctor or Healthcare provider. There are two exceptions to this rule. (see below) In the event that you require additional pain medication, you are responsible for notifying us, as stated below. 8. Cough Medicine: Often these contain an opioid, such as codeine or hydrocodone. Never accept or take cough medicine containing these opioids if you are already taking an opioid* medication. The combination may cause respiratory failure and death. 9. Medication Agreement: You are responsible for carefully reading and following our Medication Agreement. This must be signed before receiving any prescriptions from our practice. Safely store a copy of your signed Agreement. Violations to the Agreement will result in no further prescriptions.  (Additional copies of our Medication Agreement are available upon request.) 10. Laws, Rules, & Regulations: All patients are expected to follow all   Federal and State Laws, Statutes, Rules, & Regulations. Ignorance of the Laws does not constitute a valid excuse.  11. Illegal drugs and Controlled Substances: The use of illegal substances (including, but not limited to marijuana and its derivatives) and/or the illegal use of any controlled substances is strictly prohibited. Violation of this rule may result in the immediate and permanent discontinuation of any and all prescriptions being written by our practice. The use of any illegal substances is prohibited. 12. Adopted CDC guidelines & recommendations: Target dosing levels will be at or below 60 MME/day. Use of benzodiazepines** is not recommended.  Exceptions: There are only two exceptions to the rule of not receiving pain medications from other Healthcare Providers. 1. Exception #1 (Emergencies): In the event of an emergency (i.e.: accident requiring emergency care), you are allowed to receive additional pain medication. However, you are responsible for: As soon as you are able, call our office [phone], at any time of the day or night, and leave a message stating your name, the date and nature of the emergency, and the name and dose of the medication prescribed. In the event that your call is answered by a member of our staff, make sure to document and save the date, time, and the name of the person that took your information.  2. Exception #2 (Planned Surgery): In the event that you are scheduled by another doctor or dentist to have any type of surgery or procedure, you are allowed (for a period no longer than 30 days), to receive additional pain medication, for the acute post-op pain. However, in this case, you are responsible for picking up a copy of our "Post-op Pain Management for Surgeons" handout, and giving it to your surgeon or dentist. This  document is available at our office, and does not require an appointment to obtain it. Simply go to our office during business hours (Monday-Thursday from 8:00 AM to 4:00 PM) (Friday 8:00 AM to 12:00 Noon) or if you have a scheduled appointment with us, prior to your surgery, and ask for it by name. In addition, you are responsible for: calling our office [phone], at any time of the day or night, and leaving a message stating your name, name of your surgeon, type of surgery, and date of procedure or surgery. Failure to comply with your responsibilities may result in termination of therapy involving the controlled substances.  *Opioid medications include: morphine, codeine, oxycodone, oxymorphone, hydrocodone, hydromorphone, meperidine, tramadol, tapentadol, buprenorphine, fentanyl, methadone. **Benzodiazepine medications include: diazepam (Valium), alprazolam (Xanax), clonazepam (Klonopine), lorazepam (Ativan), clorazepate (Tranxene), chlordiazepoxide (Librium), estazolam (Prosom), oxazepam (Serax), temazepam (Restoril), triazolam (Halcion) (Last updated: 11/21/2020) ____________________________________________________________________________________________   ____________________________________________________________________________________________  Medication Recommendations and Reminders  Applies to: All patients receiving prescriptions (written and/or electronic).  Medication Rules & Regulations: These rules and regulations exist for your safety and that of others. They are not flexible and neither are we. Dismissing or ignoring them will be considered "non-compliance" with medication therapy, resulting in complete and irreversible termination of such therapy. (See document titled "Medication Rules" for more details.) In all conscience, because of safety reasons, we cannot continue providing a therapy where the patient does not follow instructions.  Pharmacy of record:   Definition:  This is the pharmacy where your electronic prescriptions will be sent.   We do not endorse any particular pharmacy, however, we have experienced problems with [facility] not securing enough medication supply for the community.  We do not restrict you in your choice of pharmacy. However,   once we write for your prescriptions, we will NOT be re-sending more prescriptions to fix restricted supply problems created by your pharmacy, or your insurance.   The pharmacy listed in the electronic medical record should be the one where you want electronic prescriptions to be sent.  If you choose to change pharmacy, simply notify our nursing staff.  Recommendations:  Keep all of your pain medications in a safe place, under lock and key, even if you live alone. We will NOT replace lost, stolen, or damaged medication.  After you fill your prescription, take 1 week's worth of pills and put them away in a safe place. You should keep a separate, properly labeled bottle for this purpose. The remainder should be kept in the original bottle. Use this as your primary supply, until it runs out. Once it's gone, then you know that you have 1 week's worth of medicine, and it is time to come in for a prescription refill. If you do this correctly, it is unlikely that you will ever run out of medicine.  To make sure that the above recommendation works, it is very important that you make sure your medication refill appointments are scheduled at least 1 week before you run out of medicine. To do this in an effective manner, make sure that you do not leave the office without scheduling your next medication management appointment. Always ask the nursing staff to show you in your prescription , when your medication will be running out. Then arrange for the receptionist to get you a return appointment, at least 7 days before you run out of medicine. Do not wait until you have 1 or 2 pills left, to come in. This is very poor planning and  does not take into consideration that we may need to cancel appointments due to bad weather, sickness, or emergencies affecting our staff.  DO NOT ACCEPT A "Partial Fill": If for any reason your pharmacy does not have enough pills/tablets to completely fill or refill your prescription, do not allow for a "partial fill". The law allows the pharmacy to complete that prescription within 72 hours, without requiring a new prescription. If they do not fill the rest of your prescription within those 72 hours, you will need a separate prescription to fill the remaining amount, which we will NOT provide. If the reason for the partial fill is your insurance, you will need to talk to the pharmacist about payment alternatives for the remaining tablets, but again, DO NOT ACCEPT A PARTIAL FILL, unless you can trust your pharmacist to obtain the remainder of the pills within 72 hours.  Prescription refills and/or changes in medication(s):   Prescription refills, and/or changes in dose or medication, will be conducted only during scheduled medication management appointments. (Applies to both, written and electronic prescriptions.)  No refills on procedure days. No medication will be changed or started on procedure days. No changes, adjustments, and/or refills will be conducted on a procedure day. Doing so will interfere with the diagnostic portion of the procedure.  No phone refills. No medications will be "called into the pharmacy".  No Fax refills.  No weekend refills.  No Holliday refills.  No after hours refills.  Remember:  Business hours are:  Monday to Thursday 8:00 AM to 4:00 PM Provider's Schedule: Francisco Naveira, MD - Appointments are:  Medication management: Monday and Wednesday 8:00 AM to 4:00 PM Procedure day: Tuesday and Thursday 7:30 AM to 4:00 PM Bilal Lateef, MD - Appointments are:    Medication management: Tuesday and Thursday 8:00 AM to 4:00 PM Procedure day: Monday and Wednesday  7:30 AM to 4:00 PM (Last update: 07/13/2020) ____________________________________________________________________________________________   ____________________________________________________________________________________________  CBD (cannabidiol) WARNING  Applicable to: All individuals currently taking or considering taking CBD (cannabidiol) and, more important, all patients taking opioid analgesic controlled substances (pain medication). (Example: oxycodone; oxymorphone; hydrocodone; hydromorphone; morphine; methadone; tramadol; tapentadol; fentanyl; buprenorphine; butorphanol; dextromethorphan; meperidine; codeine; etc.)  Legal status: CBD remains a Schedule I drug prohibited for any use. CBD is illegal with one exception. In the [place], CBD has a limited Food and Drug Administration (FDA) approval for the treatment of two specific types of epilepsy disorders. Only one CBD product has been approved by the FDA for this purpose: "Epidiolex". FDA is aware that some companies are marketing products containing cannabis and cannabis-derived compounds in ways that violate the Federal Food, Drug and Cosmetic Act (FD&C Act) and that may put the health and safety of consumers at risk. The FDA, a Federal agency, has not enforced the CBD status since 2018.   Legality: Some manufacturers ship CBD products nationally, which is illegal. Often such products are sold online and are therefore available throughout the country. CBD is openly sold in head shops and health food stores in some states where such sales have not been explicitly legalized. Selling unapproved products with unsubstantiated therapeutic claims is not only a violation of the law, but also can put patients at risk, as these products have not been proven to be safe or effective. Federal illegality makes it difficult to conduct research on CBD.  Reference: "FDA Regulation of Cannabis and Cannabis-Derived Products, Including Cannabidiol  (CBD)" - https://www.fda.gov/news-events/public-health-focus/fda-regulation-cannabis-and-cannabis-derived-products-including-cannabidiol-cbd  Warning: CBD is not FDA approved and has not undergo the same manufacturing controls as prescription drugs.  This means that the purity and safety of available CBD may be questionable. Most of the time, despite manufacturer's claims, it is contaminated with THC (delta-9-tetrahydrocannabinol - the chemical in marijuana responsible for the "HIGH").  When this is the case, the THC contaminant will trigger a positive urine drug screen (UDS) test for Marijuana (carboxy-THC). Because a positive UDS for any illicit substance is a violation of our medication agreement, your opioid analgesics (pain medicine) may be permanently discontinued.  MORE ABOUT CBD  General Information: CBD  is a derivative of the Marijuana (cannabis sativa) plant discovered in 1940. It is one of the 113 identified substances found in Marijuana. It accounts for up to 40% of the plant's extract. As of 2018, preliminary clinical studies on CBD included research for the treatment of anxiety, movement disorders, and pain. CBD is available and consumed in multiple forms, including inhalation of smoke or vapor, as an aerosol spray, and by mouth. It may be supplied as an oil containing CBD, capsules, dried cannabis, or as a liquid solution. CBD is thought not to be as psychoactive as THC (delta-9-tetrahydrocannabinol - the chemical in marijuana responsible for the "HIGH"). Studies suggest that CBD may interact with different biological target receptors in the body, including cannabinoid and other neurotransmitter receptors. As of 2018 the mechanism of action for its biological effects has not been determined.  Side-effects  Adverse reactions: Dry mouth, diarrhea, decreased appetite, fatigue, drowsiness, malaise, weakness, sleep disturbances, and others.  Drug interactions: CBC may interact with other  medications such as blood-thinners. (Last update: 07/30/2020) ____________________________________________________________________________________________   ____________________________________________________________________________________________  Drug Holidays (Slow)  What is a "Drug Holiday"? Drug Holiday: is the name given to the period of time during   which a patient stops taking a medication(s) for the purpose of eliminating tolerance to the drug.  Benefits . Improved effectiveness of opioids. . Decreased opioid dose needed to achieve benefits. . Improved pain with lesser dose.  What is tolerance? Tolerance: is the progressive decreased in effectiveness of a drug due to its repetitive use. With repetitive use, the body gets use to the medication and as a consequence, it loses its effectiveness. This is a common problem seen with opioid pain medications. As a result, a larger dose of the drug is needed to achieve the same effect that used to be obtained with a smaller dose.  How long should a "Drug Holiday" last? You should stay off of the pain medicine for at least 14 consecutive days. (2 weeks)  Should I stop the medicine "cold [place]"? No. You should always coordinate with your Pain Specialist so that he/she can provide you with the correct medication dose to make the transition as smoothly as possible.  How do I stop the medicine? Slowly. You will be instructed to decrease the daily amount of pills that you take by one (1) pill every seven (7) days. This is called a "slow downward taper" of your dose. For example: if you normally take four (4) pills per day, you will be asked to drop this dose to three (3) pills per day for seven (7) days, then to two (2) pills per day for seven (7) days, then to one (1) per day for seven (7) days, and at the end of those last seven (7) days, this is when the "Drug Holiday" would start.   Will I have withdrawals? By doing a "slow downward  taper" like this one, it is unlikely that you will experience any significant withdrawal symptoms. Typically, what triggers withdrawals is the sudden stop of a high dose opioid therapy. Withdrawals can usually be avoided by slowly decreasing the dose over a prolonged period of time. If you do not follow these instructions and decide to stop your medication abruptly, withdrawals may be possible.  What are withdrawals? Withdrawals: refers to the wide range of symptoms that occur after stopping or dramatically reducing opiate drugs after heavy and prolonged use. Withdrawal symptoms do not occur to patients that use low dose opioids, or those who take the medication sporadically. Contrary to benzodiazepine (example: Valium, Xanax, etc.) or alcohol withdrawals ("Delirium Tremens"), opioid withdrawals are not lethal. Withdrawals are the physical manifestation of the body getting rid of the excess receptors.  Expected Symptoms Early symptoms of withdrawal may include: . Agitation . Anxiety . Muscle aches . Increased tearing . Insomnia . Runny nose . Sweating . Yawning  Late symptoms of withdrawal may include: . Abdominal cramping . Diarrhea . Dilated pupils . Goose bumps . Nausea . Vomiting  Will I experience withdrawals? Due to the slow nature of the taper, it is very unlikely that you will experience any.  What is a slow taper? Taper: refers to the gradual decrease in dose.  (Last update: 07/13/2020) ____________________________________________________________________________________________    ____________________________________________________________________________________________  Preparing for Procedure with Sedation  Procedure appointments are limited to planned procedures: . No Prescription Refills. . No disability issues will be discussed. . No medication changes will be discussed.  Instructions: . Oral Intake: Do not eat or drink anything for at least 8 hours prior  to your procedure. (Exception: Blood Pressure Medication. See below.) . Transportation: Unless otherwise stated by your physician, you may drive yourself after the procedure. . Blood Pressure  Medicine: Do not forget to take your blood pressure medicine with a sip of water the morning of the procedure. If your Diastolic (lower reading)is above 100 mmHg, elective cases will be cancelled/rescheduled. . Blood thinners: These will need to be stopped for procedures. Notify our staff if you are taking any blood thinners. Depending on which one you take, there will be specific instructions on how and when to stop it. . Diabetics on insulin: Notify the staff so that you can be scheduled 1st case in the morning. If your diabetes requires high dose insulin, take only  of your normal insulin dose the morning of the procedure and notify the staff that you have done so. . Preventing infections: Shower with an antibacterial soap the morning of your procedure. . Build-up your immune system: Take 1000 mg of Vitamin C with every meal (3 times a day) the day prior to your procedure. Marland Kitchen Antibiotics: Inform the staff if you have a condition or reason that requires you to take antibiotics before dental procedures. . Pregnancy: If you are pregnant, call and cancel the procedure. . Sickness: If you have a cold, fever, or any active infections, call and cancel the procedure. . Arrival: You must be in the facility at least 30 minutes prior to your scheduled procedure. . Children: Do not bring children with you. . Dress appropriately: Bring dark clothing that you would not mind if they get stained. . Valuables: Do not bring any jewelry or valuables.  Reasons to call and reschedule or cancel your procedure: (Following these recommendations will minimize the risk of a serious complication.) . Surgeries: Avoid having procedures within 2 weeks of any surgery. (Avoid for 2 weeks before or after any surgery). . Flu Shots: Avoid  having procedures within 2 weeks of a flu shots or . (Avoid for 2 weeks before or after immunizations). . Barium: Avoid having a procedure within 7-10 days after having had a radiological study involving the use of radiological contrast. (Myelograms, Barium swallow or enema study). . Heart attacks: Avoid any elective procedures or surgeries for the initial 6 months after a "Myocardial Infarction" (Heart Attack). . Blood thinners: It is imperative that you stop these medications before procedures. Let us know if you if you take any blood thinner.  . Infection: Avoid procedures during or within two weeks of an infection (including chest colds or gastrointestinal problems). Symptoms associated with infections include: Localized redness, fever, chills, night sweats or profuse sweating, burning sensation when voiding, cough, congestion, stuffiness, runny nose, sore throat, diarrhea, nausea, vomiting, cold or Flu symptoms, recent or current infections. It is specially important if the infection is over the area that we intend to treat. Marland Kitchen Heart and lung problems: Symptoms that may suggest an active cardiopulmonary problem include: cough, chest pain, breathing difficulties or shortness of breath, dizziness, ankle swelling, uncontrolled high or unusually low blood pressure, and/or palpitations. If you are experiencing any of these symptoms, cancel your procedure and contact your primary care physician for an evaluation.  Remember:  Regular Business hours are:  Monday to Thursday 8:00 AM to 4:00 PM  Provider's Schedule: Milinda Pointer, MD:  Procedure days: Tuesday and Thursday 7:30 AM to 4:00 PM  Gillis Santa, MD:  Procedure days: Monday and Wednesday 7:30 AM to 4:00 PM ____________________________________________________________________________________________

## 2021-02-20 NOTE — Progress Notes (Signed)
Nursing Pain Medication Assessment:  Safety precautions to be maintained throughout the outpatient stay will include: orient to surroundings, keep bed in low position, maintain call bell within reach at all times, provide assistance with transfer out of bed and ambulation.  Medication Inspection Compliance: Pill count conducted under aseptic conditions, in front of the patient. Neither the pills nor the bottle was removed from the patient's sight at any time. Once count was completed pills were immediately returned to the patient in their original bottle.  Medication: Oxycodone IR Pill/Patch Count: 0 of 60 pills remain Pill/Patch Appearance: Markings consistent with prescribed medication Bottle Appearance: Standard pharmacy container. Clearly labeled. Filled Date: *01/24/2021 [facility] Medication intake:  Erika [name]

## 2021-02-23 ENCOUNTER — Encounter: Payer: Self-pay | Admitting: Pain Medicine

## 2021-02-23 ENCOUNTER — Ambulatory Visit (HOSPITAL_BASED_OUTPATIENT_CLINIC_OR_DEPARTMENT_OTHER): Payer: Managed Care, Other (non HMO) | Admitting: Pain Medicine

## 2021-02-23 ENCOUNTER — Other Ambulatory Visit: Payer: Self-pay

## 2021-02-23 ENCOUNTER — Ambulatory Visit
Admission: RE | Admit: 2021-02-23 | Discharge: 2021-02-23 | Disposition: A | Discharge status: Home / Self Care | Payer: Managed Care, Other (non HMO) | Source: Ambulatory Visit | Attending: Pain Medicine | Admitting: Pain Medicine

## 2021-02-23 ENCOUNTER — Telehealth: Payer: Self-pay

## 2021-02-23 VITALS — BP 131/84 | Temp 97.2°F | Resp 16 | Ht 67.0 in | Wt 196.0 lb

## 2021-02-23 DIAGNOSIS — M67952 Unspecified disorder of synovium and tendon, left thigh: Secondary | ICD-10-CM | POA: Insufficient documentation

## 2021-02-23 DIAGNOSIS — I10 Essential (primary) hypertension: Secondary | ICD-10-CM | POA: Insufficient documentation

## 2021-02-23 DIAGNOSIS — M7072 Other bursitis of hip, left hip: Secondary | ICD-10-CM | POA: Diagnosis present

## 2021-02-23 DIAGNOSIS — G8929 Other chronic pain: Secondary | ICD-10-CM

## 2021-02-23 DIAGNOSIS — M1612 Unilateral primary osteoarthritis, left hip: Secondary | ICD-10-CM

## 2021-02-23 DIAGNOSIS — G894 Chronic pain syndrome: Secondary | ICD-10-CM | POA: Diagnosis present

## 2021-02-23 DIAGNOSIS — M7602 Gluteal tendinitis, left hip: Secondary | ICD-10-CM | POA: Diagnosis present

## 2021-02-23 DIAGNOSIS — M25552 Pain in left hip: Secondary | ICD-10-CM

## 2021-02-23 DIAGNOSIS — Z7901 Long term (current) use of anticoagulants: Secondary | ICD-10-CM | POA: Diagnosis present

## 2021-02-23 MED ORDER — MIDAZOLAM HCL 5 MG/5ML IJ SOLN
1.0000 mg | INTRAMUSCULAR | Status: DC | PRN
  Administered 2021-02-23: 2 mg via INTRAVENOUS
  Filled 2021-02-23: qty 5

## 2021-02-23 MED ORDER — IOHEXOL 180 MG/ML  SOLN
10.0000 mL | Freq: Once | INTRAMUSCULAR | Status: DC
  Filled 2021-02-23: qty 20

## 2021-02-23 MED ORDER — OXYCODONE HCL 5 MG PO TABS: 5.0000 mg | ORAL_TABLET | Freq: Two times a day (BID) | ORAL | 0 refills | Status: DC | PRN

## 2021-02-23 MED ORDER — LIDOCAINE HCL (PF) 2 % IJ SOLN
INTRAMUSCULAR | Status: AC
  Filled 2021-02-23: qty 10

## 2021-02-23 MED ORDER — ROPIVACAINE HCL 2 MG/ML IJ SOLN
9.0000 mL | Freq: Once | INTRAMUSCULAR | Status: AC
  Administered 2021-02-23: 9 mL via INTRA_ARTICULAR
  Filled 2021-02-23: qty 10

## 2021-02-23 MED ORDER — LACTATED RINGERS IV SOLN: 1000.0000 mL | Freq: Once | INTRAVENOUS | Status: DC

## 2021-02-23 MED ORDER — FENTANYL CITRATE (PF) 100 MCG/2ML IJ SOLN
25.0000 ug | INTRAMUSCULAR | Status: DC | PRN
  Administered 2021-02-23: 50 ug via INTRAVENOUS
  Filled 2021-02-23: qty 2

## 2021-02-23 MED ORDER — LIDOCAINE HCL 2 % IJ SOLN
20.0000 mL | Freq: Once | INTRAMUSCULAR | Status: AC
  Administered 2021-02-23: 200 mg
  Filled 2021-02-23: qty 40

## 2021-02-23 MED ORDER — METHYLPREDNISOLONE ACETATE 80 MG/ML IJ SUSP
80.0000 mg | Freq: Once | INTRAMUSCULAR | Status: AC
  Administered 2021-02-23: 80 mg via INTRA_ARTICULAR
  Filled 2021-02-23: qty 1

## 2021-02-23 NOTE — Patient Instructions (Addendum)

## 2021-02-23 NOTE — Progress Notes (Signed)
PROVIDER NOTE: Information contained herein reflects review and annotations entered in association with encounter. Interpretation of such information and data should be left to medically-trained personnel. Information provided to patient can be located elsewhere in the medical record under "Patient Instructions". Document created using STT-dictation technology, any transcriptional errors that may result from process are unintentional.    Patient: Erika [name]  Service Category: Procedure  Provider: Gaspar Cola, MD  DOB: 1971/12/24  DOS: 02/23/2021  Location: [facility]  MRN: [id number]  Setting: Ambulatory - outpatient  Referring Provider: Lesleigh Noe, MD  Type: Established Patient  Specialty: Interventional Pain Management  PCP: Lesleigh Noe, MD   Primary Reason for Visit: Interventional Pain Management Treatment. CC: Hip Pain (left)  Procedure #1:  Anesthesia, Analgesia, Anxiolysis:  Type: Intra-Articular Hip Injection #2  Primary Purpose: Therapeutic Region: Posterolateral hip joint area. Level: Lower pelvic and hip joint level. Target Area: Superior aspect of the hip joint cavity, going thru the superior portion of the capsular ligament. Approach: Posterolateral approach. Laterality: Left  Type: Moderate (Conscious) Sedation combined with Local Anesthesia Indication(s): Analgesia and Anxiety Route: Intravenous (IV) IV Access: Secured Sedation: Meaningful verbal contact was maintained at all times during the procedure  Local Anesthetic: Lidocaine 1-2%  Position: Lateral Decubitus with bad side up Area Prepped: Entire Posterolateral hip area. DuraPrep (Iodine Povacrylex [0.7% available iodine] and Isopropyl Alcohol, 74% w/w)   Procedure #2:    Type: peri-insertional gluteus medius tendon injection  #3 Primary Purpose: Therapeutic Region: Upper (proximal) Femoral Region Level: Hip Joint Target Area: Superior aspect of the hip joint cavity, going  thru the superior portion of the capsular ligament. Approach: Posterolateral approach Laterality: Left     Indications: 1. Chronic hip pain (Left)   2. Gluteal tendonitis of buttock (Left)   3. Osteoarthritis of hip (Left)   4. Tendinopathy of gluteus medius (Left)   5. Bursitis of other bursa of left hip   6. Chronic anticoagulation (PLAVIX)    Pain Score: Pre-procedure: 7 /10 Post-procedure: 0-No pain/10   Pre-op H&P Assessment:  Erika [name] is a 50 y.o. (year old), female patient, seen today for interventional treatment. She  has a past surgical history that includes Cholecystectomy; Tonsillectomy; Cesarean section; Cardiac catheterization ([name], 04/18/2016); Cardiac catheterization ([name], 06/29/2016); Coronary artery bypass graft ([name], 07/06/2016); TEE without cardioversion ([name], 07/06/2016); Endarterectomy (Right, 07/06/2016); Cardiac catheterization ([name], 08/29/2016); ABDOMINAL AORTOGRAM W/LOWER EXTREMITY ([name], 10/15/2018); PERIPHERAL VASCULAR INTERVENTION (Left, 10/15/2018); ABDOMINAL AORTOGRAM W/LOWER EXTREMITY (Bilateral, 08/19/2019); Endarterectomy (Right, 04/27/2020); and LEFT HEART CATH AND CORS/GRAFTS ANGIOGRAPHY ([name], 08/24/2020). Ms. Jump has a current medication list which includes the following prescription(s): albuterol, amlodipine, aspirin ec, benztropine, carvedilol, chlorpromazine, clonazepam, clonidine, clopidogrel, farxiga, fluoxetine hcl, toujeo max solostar, insulin lispro, insulin syringe-needle u-100, isosorbide mononitrate, lamotrigine, losartan, naloxone, nicotine, nitroglycerin, ondansetron, oxycodone, [START ON 03/25/2021] oxycodone, [START ON 04/24/2021] oxycodone, pregabalin, repatha sureclick, rosuvastatin, spironolactone, tobramycin-dexamethasone, and torsemide, and the following Facility-Administered Medications: fentanyl, iohexol, lactated ringers, and midazolam. Her primarily concern today is the Hip Pain (left)  Initial Vital Signs:  Pulse/HCG Rate:  ECG Heart Rate:  90 Temp: (!) 97.2 F (36.2 C) Resp: 18 BP: (!) 213/125 SpO2: 95 %  BMI: Estimated body mass index is 30.7 kg/m as calculated from the following:   Height as of this encounter: 5\' 7"  (1.702 m).   Weight as of this encounter: 196 lb (88.9 kg).  Risk Assessment: Allergies: Reviewed. She is allergic to chantix [varenicline tartrate].  Allergy Precautions: None required Coagulopathies:  Reviewed. None identified.  Blood-thinner therapy: None at this time Active Infection(s): Reviewed. None identified. Erika [name] is afebrile  Site Confirmation: Erika [name] was asked to confirm the procedure and laterality before marking the site Procedure checklist: Completed Consent: Before the procedure and under the influence of no sedative(s), amnesic(s), or anxiolytics, the patient was informed of the treatment options, risks and possible complications. To fulfill our ethical and legal obligations, as recommended by the American Medical Association's Code of Ethics, I have informed the patient of my clinical impression; the nature and purpose of the treatment or procedure; the risks, benefits, and possible complications of the intervention; the alternatives, including doing nothing; the risk(s) and benefit(s) of the alternative treatment(s) or procedure(s); and the risk(s) and benefit(s) of doing nothing. The patient was provided information about the general risks and possible complications associated with the procedure. These may include, but are not limited to: failure to achieve desired goals, infection, bleeding, organ or nerve damage, allergic reactions, paralysis, and death. In addition, the patient was informed of those risks and complications associated to the procedure, such as failure to decrease pain; infection; bleeding; organ or nerve damage with subsequent damage to sensory, motor, and/or autonomic systems, resulting in permanent pain, numbness, and/or weakness of one or several areas of the  body; allergic reactions; (i.e.: anaphylactic reaction); and/or death. Furthermore, the patient was informed of those risks and complications associated with the medications. These include, but are not limited to: allergic reactions (i.e.: anaphylactic or anaphylactoid reaction(s)); adrenal axis suppression; blood sugar elevation that in diabetics may result in ketoacidosis or comma; water retention that in patients with history of congestive heart failure may result in shortness of breath, pulmonary edema, and decompensation with resultant heart failure; weight gain; swelling or edema; medication-induced neural toxicity; particulate matter embolism and blood vessel occlusion with resultant organ, and/or nervous system infarction; and/or aseptic necrosis of one or more joints. Finally, the patient was informed that Medicine is not an exact science; therefore, there is also the possibility of unforeseen or unpredictable risks and/or possible complications that may result in a catastrophic outcome. The patient indicated having understood very clearly. We have given the patient no guarantees and we have made no promises. Enough time was given to the patient to ask questions, all of which were answered to the patient's satisfaction. Ms. Wiltsie has indicated that she wanted to continue with the procedure. Attestation: I, the ordering provider, attest that I have discussed with the patient the benefits, risks, side-effects, alternatives, likelihood of achieving goals, and potential problems during recovery for the procedure that I have provided informed consent. Date  Time: 02/23/2021  8:31 AM  Pre-Procedure Preparation:  Monitoring: As per clinic protocol. Respiration, ETCO2, SpO2, BP, heart rate and rhythm monitor placed and checked for adequate function Safety Precautions: Patient was assessed for positional comfort and pressure points before starting the procedure. Time-out: I initiated and conducted the  "Time-out" before starting the procedure, as per protocol. The patient was asked to participate by confirming the accuracy of the "Time Out" information. Verification of the correct person, site, and procedure were performed and confirmed by me, the nursing staff, and the patient. "Time-out" conducted as per Joint Commission's Universal Protocol (UP.01.01.01). Time: [phone]  Description of Procedure #1:  Safety Precautions: Aspiration looking for blood return was conducted prior to all injections. At no point did we inject any substances, as a needle was being advanced. No attempts were made at seeking any paresthesias. Safe injection practices  and needle disposal techniques used. Medications properly checked for expiration dates. SDV (single dose vial) medications used. Description of the Procedure: Protocol guidelines were followed. The patient was placed in position over the fluoroscopy table. The target area was identified and the area prepped in the usual manner. Skin & deeper tissues infiltrated with local anesthetic. Appropriate amount of time allowed to pass for local anesthetics to take effect. The procedure needles were then advanced to the target area. Proper needle placement secured. Negative aspiration confirmed. Solution injected in intermittent fashion, asking for systemic symptoms every 0.5cc of injectate. The needles were then removed and the area cleansed, making sure to leave some of the prepping solution back to take advantage of its long term bactericidal properties.  Start Time: 0943 hrs. Materials:  Needle(s) Type: Spinal Needle Gauge: 22G Length: 5.0-in Medication(s): Please see orders for medications and dosing details.  Imaging Guidance (Non-Spinal):          Type of Imaging Technique: Fluoroscopy Guidance (Non-Spinal) Indication(s): Assistance in needle guidance and placement for procedures requiring needle placement in or near specific anatomical locations not easily  accessible without such assistance. Exposure Time: Please see nurses notes. Contrast: Before injecting any contrast, we confirmed that the patient did not have an allergy to iodine, shellfish, or radiological contrast. Once satisfactory needle placement was completed at the desired level, radiological contrast was injected. Contrast injected under live fluoroscopy. No contrast complications. See chart for type and volume of contrast used. Fluoroscopic Guidance: I was personally present during the use of fluoroscopy. "Tunnel Vision Technique" used to obtain the best possible view of the target area. Parallax error corrected before commencing the procedure. "Direction-depth-direction" technique used to introduce the needle under continuous pulsed fluoroscopy. Once target was reached, antero-posterior, oblique, and lateral fluoroscopic projection used confirm needle placement in all planes. Images permanently stored in EMR. Interpretation: I personally interpreted the imaging intraoperatively. Adequate needle placement confirmed in multiple planes. Appropriate spread of contrast into desired area was observed. No evidence of afferent or efferent intravascular uptake. Permanent images saved into the patient's record.   Description of Procedure #2:  Description of the Procedure: Skin & deeper tissues infiltrated with local anesthetic. Appropriate amount of time allowed to pass for local anesthetics to take effect. The procedure needles were then advanced to the target area. Proper needle placement secured. Negative aspiration confirmed. Solution injected in intermittent fashion, asking for systemic symptoms every 0.5cc of injectate. The needles were then removed and the area cleansed, making sure to leave some of the prepping solution back to take advantage of its long term bactericidal properties.  Vitals:   02/23/21 0954 02/23/21 1003 02/23/21 1013 02/23/21 1024  BP: (!) 133/113 (!) 145/74 131/84 131/84   Resp: 16 16 16 16   Temp:      SpO2: 94% 99% 99% 98%  Weight:      Height:         End Time: 0951 hrs.    Materials:  Needle(s) Type: Spinal Needle Gauge: 22G Length: 7-in Medication(s): Please see orders for medications and dosing details.  Imaging Guidance (Non-Spinal):          Type of Imaging Technique: Fluoroscopy Guidance (Non-Spinal) Indication(s): Assistance in needle guidance and placement for procedures requiring needle placement in or near specific anatomical locations not easily accessible without such assistance. Exposure Time: Please see nurses notes. Contrast: Before injecting any contrast, we confirmed that the patient did not have an allergy to iodine, shellfish, or radiological contrast. Once  satisfactory needle placement was completed at the desired level, radiological contrast was injected. Contrast injected under live fluoroscopy. No contrast complications. See chart for type and volume of contrast used. Fluoroscopic Guidance: I was personally present during the use of fluoroscopy. "Tunnel Vision Technique" used to obtain the best possible view of the target area. Parallax error corrected before commencing the procedure. "Direction-depth-direction" technique used to introduce the needle under continuous pulsed fluoroscopy. Once target was reached, antero-posterior, oblique, and lateral fluoroscopic projection used confirm needle placement in all planes. Images permanently stored in EMR. Interpretation: I personally interpreted the imaging intraoperatively. Adequate needle placement confirmed in multiple planes. Appropriate spread of contrast into desired area was observed. No evidence of afferent or efferent intravascular uptake. Permanent images saved into the patient's record.  Antibiotic Prophylaxis:   Anti-infectives (From admission, onward)   None     Indication(s): None identified  Post-operative Assessment:  Post-procedure Vital Signs:  Pulse/HCG Rate:   84 Temp: (!) 97.2 F (36.2 C) Resp: 16 BP: 131/84 SpO2: 98 %  EBL: None  Complications: No immediate post-treatment complications observed by team, or reported by patient.  Note: The patient tolerated the entire procedure well. A repeat set of vitals were taken after the procedure and the patient was kept under observation following institutional policy, for this type of procedure. Post-procedural neurological assessment was performed, showing return to baseline, prior to discharge. The patient was provided with post-procedure discharge instructions, including a section on how to identify potential problems. Should any problems arise concerning this procedure, the patient was given instructions to immediately contact us, at any time, without hesitation. In any case, we plan to contact the patient by telephone for a follow-up status report regarding this interventional procedure.  Comments:  No additional relevant information.  Plan of Care  Orders:  Orders Placed This Encounter  Procedures  . HIP INJECTION    Scheduling Instructions:     Side: Left-sided     Sedation: With Sedation.     Timeframe: Today  . DG PAIN CLINIC C-ARM 1-60 MIN NO REPORT    Intraoperative interpretation by procedural physician at [place].    Standing Status:   Standing    Number of Occurrences:   1    Order Specific Question:   Reason for exam:    Answer:   Assistance in needle guidance and placement for procedures requiring needle placement in or near specific anatomical locations not easily accessible without such assistance.  . VMA, random urine    Order Specific Question:   Release to patient    Answer:   Immediate  . Informed Consent Details: Physician/Practitioner Attestation; Transcribe to consent form and obtain patient signature    Nursing Order: Transcribe to consent form and obtain patient signature. Note: Always confirm laterality of pain with Ms. Leuthold, before procedure.     Order Specific Question:   Physician/Practitioner attestation of informed consent for procedure/surgical case    Answer:   I, the physician/practitioner, attest that I have discussed with the patient the benefits, risks, side effects, alternatives, likelihood of achieving goals and potential problems during recovery for the procedure that I have provided informed consent.    Order Specific Question:   Procedure    Answer:   Hip injection    Order Specific Question:   Physician/Practitioner performing the procedure    Answer:   Francisco A. Dossie Arbour, MD    Order Specific Question:   Indication/Reason    Answer:   Hip  Joint Pain (Arthralgia)  . Care order/instruction: Please confirm that the patient has stopped the Plavix (Clopidogrel) x 7-10 days prior to procedure or surgery.    Please confirm that the patient has stopped the Plavix (Clopidogrel) x 7-10 days prior to procedure or surgery.    Standing Status:   Standing    Number of Occurrences:   1  . Provide equipment / supplies at bedside    "Block Tray" (Disposable  single use) Needle type: SpinalSpinal Amount/quantity: 1 Size: Long (7-inch) Gauge: 22G    Standing Status:   Standing    Number of Occurrences:   1    Order Specific Question:   Specify    Answer:   Block Tray  . Bleeding precautions    Standing Status:   Standing    Number of Occurrences:   1   Chronic Opioid Analgesic:  Oxycodone IR 5 mg. 1 tab PO BID (10 mg/day of oxycodone) MME/day: 15 mg/day.   Medications ordered for procedure: Meds ordered this encounter  Medications  . iohexol (OMNIPAQUE) 180 MG/ML injection 10 mL    Must be Myelogram-compatible. If not available, you may substitute with a water-soluble, non-ionic, hypoallergenic, myelogram-compatible radiological contrast medium.  Marland Kitchen lidocaine (XYLOCAINE) 2 % (with pres) injection 400 mg  . lactated ringers infusion 1,000 mL  . midazolam (VERSED) 5 MG/5ML injection 1-2 mg    Make sure Flumazenil is  available in the pyxis when using this medication. If oversedation occurs, administer 0.2 mg IV over 15 sec. If after 45 sec no response, administer 0.2 mg again over 1 min; may repeat at 1 min intervals; not to exceed 4 doses (1 mg)  . fentaNYL (SUBLIMAZE) injection 25-50 mcg    Make sure Narcan is available in the pyxis when using this medication. In the event of respiratory depression (RR< 8/min): Titrate NARCAN (naloxone) in increments of 0.1 to 0.2 mg IV at 2-3 minute intervals, until desired degree of reversal.  . ropivacaine (PF) 2 mg/mL (0.2%) (NAROPIN) injection 9 mL  . methylPREDNISolone acetate (DEPO-MEDROL) injection 80 mg   Medications administered: We administered lidocaine, midazolam, fentaNYL, ropivacaine (PF) 2 mg/mL (0.2%), and methylPREDNISolone acetate.  See the medical record for exact dosing, route, and time of administration.  Follow-up plan:   Return in about 2 weeks (around 03/09/2021) for on pm PD-schld, (VV), (PPE).      Interventional Therapies  Risk  Complexity Considerations:   NOTE: PLAVIX ANTICOAGULATION(Stop:7-10 days pre-procedure; Restart:2 hours post-proc.)   Planned  Pending:   Therapeutic left IA hip joint injection #2 + left peri-insertional gluteus medius tendon injection #3    Under consideration:   Diagnostic right facial NB Diagnostic right geniculate NB  Diagnostic right occipital NB  Diagnostic Botox injections   Completed:   Therapeutic left peri-insertional gluteus medius tendon injection x2 (08/02/2020) (100/100/100/100) Therapeutic left IA hip joint injection x1 (08/02/2020) (100/100/100/100) Therapeutic/palliative Facial (CN VII)/GeniculateNB x3 (03/05/2019) (100/100/0/0)   Therapeutic  Palliative (PRN) options:   Therapeutic left peri-insertional gluteus medius tendon injection #3   Therapeutic left IA hip joint injection #2  Therapeutic/palliative Facial (CN VII) NB #4     Recent Visits Date Type Provider Dept  02/20/21  Office Visit Milinda Pointer, MD [facility]  Showing recent visits within past 90 days and meeting all other requirements Today's Visits Date Type Provider Dept  02/23/21 Procedure visit Milinda Pointer, MD [facility]  Showing today's visits and meeting all other requirements Future Appointments Date Type Provider  Dept  03/09/21 Appointment Milinda Pointer, MD [facility]  05/10/21 Appointment Milinda Pointer, MD [facility]  Showing future appointments within next 90 days and meeting all other requirements  Disposition: Discharge home  Discharge (Date  Time): 02/23/2021; 1026 hrs.   Primary Care Physician: Lesleigh Noe, MD Location: [facility] Note by: Gaspar Cola, MD Date: 02/23/2021; Time: 10:33 AM  Disclaimer:  Medicine is not an exact science. The only guarantee in medicine is that nothing is guaranteed. It is important to note that the decision to proceed with this intervention was based on the information collected from the patient. The Data and conclusions were drawn from the patient's questionnaire, the interview, and the physical examination. Because the information was provided in large part by the patient, it cannot be guaranteed that it has not been purposely or unconsciously manipulated. Every effort has been made to obtain as much relevant data as possible for this evaluation. It is important to note that the conclusions that lead to this procedure are derived in large part from the available data. Always take into account that the treatment will also be dependent on availability of resources and existing treatment guidelines, considered by other Pain Management Practitioners as being common knowledge and practice, at the time of the intervention. For Medico-Legal purposes, it is also important to point out that variation in procedural techniques and pharmacological choices are the  acceptable norm. The indications, contraindications, technique, and results of the above procedure should only be interpreted and judged by a Board-Certified Interventional Pain Specialist with extensive familiarity and expertise in the same exact procedure and technique.

## 2021-02-23 NOTE — Addendum Note (Signed)
Addended by: Milinda Pointer A on: 02/23/2021 01:01 PM   Modules accepted: Orders

## 2021-02-23 NOTE — Telephone Encounter (Signed)
Pt states her Oxycodone prescription have the wrong date on it, it's suppose to be filled 02/23/21 not 03/03/21

## 2021-02-23 NOTE — Telephone Encounter (Signed)
Pharmacy called and scripts were deleted. Dr. Dossie Arbour to send in corrected ones. Patient called and informed.

## 2021-02-23 NOTE — Progress Notes (Signed)
Safety precautions to be maintained throughout the outpatient stay will include: orient to surroundings, keep bed in low position, maintain call bell within reach at all times, provide assistance with transfer out of bed and ambulation.  

## 2021-02-24 ENCOUNTER — Telehealth: Payer: Self-pay

## 2021-02-24 NOTE — Telephone Encounter (Signed)
Post procedure follow up phone call.  LM 

## 2021-03-02 ENCOUNTER — Telehealth: Payer: Self-pay | Admitting: Cardiovascular Disease

## 2021-03-02 ENCOUNTER — Other Ambulatory Visit (HOSPITAL_COMMUNITY): Payer: Self-pay | Admitting: Psychiatry

## 2021-03-02 DIAGNOSIS — F4310 Post-traumatic stress disorder, unspecified: Secondary | ICD-10-CM

## 2021-03-02 NOTE — Telephone Encounter (Signed)
Nurse case manager is calling to give [name] her contact information,. Synetta Fail [phone]

## 2021-03-04 NOTE — Progress Notes (Addendum)
Patient: Erika [name]  Service Category: E/M  Provider: Gaspar Cola, MD  DOB: Jul 18, 1971  DOS: 03/09/2021  Location: Office  MRN: [id number]  Setting: Ambulatory outpatient  Referring Provider: Lesleigh Noe, MD  Type: Established Patient  Specialty: Interventional Pain Management  PCP: Erika Noe, MD  Location: Remote location  Delivery: TeleHealth     Virtual Encounter - Pain Management PROVIDER NOTE: Information contained herein reflects review and annotations entered in association with encounter. Interpretation of such information and data should be left to medically-trained personnel. Information provided to patient can be located elsewhere in the medical record under "Patient Instructions". Document created using STT-dictation technology, any transcriptional errors that may result from process are unintentional.    Contact & Pharmacy Preferred: [phone] Home: [phone] (home) Mobile: [phone] (mobile) E-mail: [email or web address]  [id number] [place], [address] AT [address] [place] [address] Phone: [phone] Fax: [phone]  [id number]Phone: [phone]: [phone]  Pre-screening  Erika [name] offered "in-person" vs "virtual" encounter. She indicated preferring virtual for this encounter.   Reason COVID-19*  Social distancing based on CDC and AMA recommendations.   I contacted Erika DICAMILLOon 03/09/2021 via telephone.      I clearly identified myself as FGaspar Cola MD. I verified that I was speaking with the correct person using two identifiers (Name: Erika [name] and date of birth: 127-Mar-1972.  Consent I sought verbal advanced consent from Erika Grimefor virtual visit interactions. I informed Erika [name] of possible security and privacy  concerns, risks, and limitations associated with providing "not-in-person" medical evaluation and management services. I also informed Erika [name] of the availability of "in-person" appointments. Finally, I informed her that there would be a charge for the virtual visit and that she could be  personally, fully or partially, financially responsible for it. Ms. MPastranaexpressed understanding and agreed to proceed.   Historic Elements   Erika [name] a 50y.o. year old, female patient evaluated today after our last contact on 02/23/2021. Erika [name] a past medical history of Arthralgia of temporomandibular joint, CAD, multiple vessel, Carotid arterial disease ([place], [place] disorder ([place], Depression, Diastolic dysfunction, Fatty liver disease, nonalcoholic ([address], History of blood transfusion, HLD (hyperlipidemia), Labile hypertension, [place]ocardial infarction ([place] (2017), Obesity, PAD (peripheral artery disease) ([place], PTSD (post-traumatic stress disorder), Tobacco abuse, and Type 2 diabetes mellitus ([facility] (January 2017). She also  [name] a past surgical history that includes Cholecystectomy; Tonsillectomy; Cesarean section; Cardiac catheterization ([name], 04/18/2016); Cardiac catheterization ([name], 06/29/2016); Coronary artery bypass graft ([name], 07/06/2016); TEE without cardioversion ([name], 07/06/2016); Endarterectomy (Right, 07/06/2016); Cardiac catheterization ([name], 08/29/2016); ABDOMINAL AORTOGRAM W/LOWER EXTREMITY ([name], 10/15/2018); PERIPHERAL VASCULAR INTERVENTION (Left, 10/15/2018); ABDOMINAL AORTOGRAM W/LOWER EXTREMITY (Bilateral, 08/19/2019); Endarterectomy (Right, 04/27/2020); and LEFT HEART CATH AND CORS/GRAFTS ANGIOGRAPHY ([name], 08/24/2020). Erika [name] a current medication list which includes the following prescription(s): albuterol, amlodipine, aspirin ec, benztropine, carvedilol, chlorpromazine, clonazepam, clonidine, clopidogrel, farxiga, fluoxetine hcl, toujeo max solostar, insulin lispro,  insulin syringe-needle u-100, isosorbide mononitrate, lamotrigine, losartan, naloxone, nicotine, nitroglycerin, ondansetron, oxycodone, [START ON 03/25/2021] oxycodone, [START ON 04/24/2021] oxycodone, pregabalin, repatha sureclick, rosuvastatin, spironolactone, tobramycin-dexamethasone, and torsemide. She  reports that she [name] been smoking cigarettes. She [name] a 13.50 pack-year smoking history. She [name] never used smokeless tobacco. She reports previous alcohol use. She reports that she does  not use drugs. Erika [name] is allergic to chantix [varenicline tartrate].   HPI  Today, she is being contacted for a post-procedure assessment.  The patient refers having attained an ongoing 70 to 80% relief of her hip pain from the interventional treatments done on 02/23/2021.  She refers that currently the only pain she [name] is when she sits on that left hip or when she sleeps on the left side other than that, the pain [name] improved significantly and she is able to move appropriately.  She is scheduled to see her primary care physician soon and hopefully she will be taking over the pregabalin and we will continue to monitor and write for her opioid analgesic (oxycodone).  Post-Procedure Evaluation  Procedure (02/23/2021): Therapeutic left IA hip injection #2 + left periinsertional gluteus medius tendon injection #3 under fluoroscopic guidance and IV sedation Pre-procedure pain level: 7/10 Post-procedure: 0/10 (100% relief)  Sedation: Sedation provided.  Effectiveness during initial hour after procedure(Ultra-Short Term Relief): 100 %.  Local anesthetic used: Long-acting (4-6 hours) Effectiveness: Defined as any analgesic benefit obtained secondary to the administration of local anesthetics. This carries significant diagnostic value as to the etiological location, or anatomical origin, of the pain. Duration of benefit is expected to coincide with the duration of the local anesthetic used.  Effectiveness during initial 4-6  hours after procedure(Short-Term Relief): 100 %.  Long-term benefit: Defined as any relief past the pharmacologic duration of the local anesthetics.  Effectiveness past the initial 6 hours after procedure(Long-Term Relief): 70 %.  Current benefits: Defined as benefit that persist at this time.   Analgesia:  80% relief Function: Erika [name] reports improvement in function ROM: Erika [name] reports improvement in ROM  Pharmacotherapy Assessment  Analgesic: Oxycodone IR 5 mg. 1 tab PO BID (10 mg/day of oxycodone) MME/day: 15 mg/day.   Monitoring: [facility] PMP: PDMP reviewed during this encounter.       Pharmacotherapy: No side-effects or adverse reactions reported. Compliance: No problems identified. Effectiveness: Clinically acceptable. Plan: Refer to "POC".  UDS:  Summary  Date Value Ref Range Status  06/23/2020 Note  Corrected    Comment:    ==================================================================== [employer]ox[employer] Select 13 (MW) ==================================================================== Test                             Result       Flag       Units  Drug Present and Declared for Prescription Verification   Clonazepam                     28           EXPECTED   ng/mg creat   7-aminoclonazepam              303          EXPECTED   ng/mg creat    Source of clonazepam is a scheduled prescription medication. 7-    aminoclonazepam is an expected metabolite of clonazepam.  Drug Absent but Declared for Prescription Verification   Oxycodone                      Not Detected UNEXPECTED ng/mg creat ==================================================================== Test                      Result    Flag   Units      Ref Range   Creatinine  105              mg/dL      >=20 ==================================================================== Declared Medications:  The flagging and interpretation on this report are based on the  following declared medications.   Unexpected results may arise from  inaccuracies in the declared medications.   **Note: The testing scope of this panel includes these medications:   Clonazepam (Klonopin)  Oxycodone (Roxicodone)   **Note: The testing scope of this panel does not include the  following reported medications:   Albuterol (Ventolin HFA)  Amlodipine (Norvasc)  Amoxicillin (Amoxil)  Aspirin  Benztropine (Cogentin)  Carvedilol (Coreg)  Chlorpromazine  Clonidine (Catapres)  Clopidogrel (Plavix)  Dapagliflozin (Farxiga)  Evolocumab  Fluoxetine  Insulin (Toujeo)  Isosorbide (Imdur)  Lamotrigine (Lamictal)  Losartan (Cozaar)  Mouthwash  Naloxone (Narcan)  Nicotine  Nitroglycerin (Nitrostat)  Ondansetron (Zofran)  Pregabalin (Lyrica)  Spironolactone (Aldactone)  Vitamin D3 ==================================================================== For clinical consultation, please call [phone]. ====================================================================     Laboratory Chemistry Profile   Renal Lab Results  Component Value Date   BUN 7 01/24/2021   CREATININE 0.77 01/24/2021   LABCREA 54.6 02/23/2021   BCR 18 01/12/2021   GFR 105.97 11/21/2020   GFRAA 106 01/12/2021   GFRNONAA >60 01/24/2021   LABVMA 1.3 02/23/2021   LABVMA 2.4 02/23/2021     Hepatic Lab Results  Component Value Date   AST 16 11/21/2020   ALT 36 (H) 11/21/2020   ALBUMIN 4.1 11/21/2020   ALKPHOS 96 11/21/2020   HCVAB <0.1 09/15/2019   LIPASE 34 05/28/2009     Electrolytes Lab Results  Component Value Date   NA 136 01/24/2021   K 4.4 01/24/2021   CL 96 (L) 01/24/2021   CALCIUM 10.1 01/24/2021   MG 2.0 01/07/2019   PHOS 3.1 05/19/2017     Bone Lab Results  Component Value Date   VD25OH 14.27 (L) 01/18/2020   25OHVITD1 16 (L) 01/07/2019   25OHVITD2 <1.0 01/07/2019   25OHVITD3 15 01/07/2019     Inflammation (CRP: Acute Phase) (ESR: Chronic Phase) Lab Results  Component Value Date   CRP  15 (H) 01/07/2019   ESRSEDRATE 17 03/17/2019   LATICACIDVEN 2.3 ([place]) 05/19/2017       Note: Above Lab results reviewed.  Imaging  DG PAIN CLINIC C-ARM 1-60 MIN NO REPORT Fluoro was used, but no Radiologist interpretation will be provided.  Please refer to "NOTES" tab for provider progress note.  Assessment  The primary encounter diagnosis was Chronic pain syndrome. Diagnoses of Chronic hip pain (Left), Gluteal tendonitis of buttock (Left), Osteoarthritis of hip (Left), Tendinopathy of gluteus medius (Left), and Bursitis of other bursa of left hip were also pertinent to this visit.  Plan of Care  Problem-specific:  No problem-specific Assessment & Plan notes found for this encounter.  Erika [name] [name] a current medication list which includes the following long-term medication(s): albuterol, amlodipine, benztropine, carvedilol, chlorpromazine, clonazepam, clonidine, fluoxetine hcl, toujeo max solostar, insulin lispro, isosorbide mononitrate, lamotrigine, losartan, nitroglycerin, oxycodone, [START ON 03/25/2021] oxycodone, [START ON 04/24/2021] oxycodone, pregabalin, rosuvastatin, spironolactone, and torsemide.  Pharmacotherapy (Medications Ordered): No orders of the defined types were placed in this encounter.  Orders:  No orders of the defined types were placed in this encounter.  Follow-up plan:   Return for scheduled encounter, (F2F), (MM).      Interventional Therapies  Risk  Complexity Considerations:   NOTE: PLAVIX ANTICOAGULATION(Stop:7-10 days pre-procedure; Restart:2 hours post-proc.)   Planned  Pending:  Therapeutic left IA hip joint injection #2 + left peri-insertional gluteus medius tendon injection #3    Under consideration:   Diagnostic right facial NB Diagnostic right geniculate NB  Diagnostic right occipital NB  Diagnostic Botox injections   Completed:   Therapeutic left peri-insertional gluteus medius tendon injection x2 (08/02/2020)  (100/100/100/100) Therapeutic left IA hip joint injection x1 (08/02/2020) (100/100/100/100) Therapeutic/palliative Facial (CN VII)/GeniculateNB x3 (03/05/2019) (100/100/0/0)   Therapeutic  Palliative (PRN) options:   Therapeutic left peri-insertional gluteus medius tendon injection #3   Therapeutic left IA hip joint injection #2  Therapeutic/palliative Facial (CN VII) NB #4     Recent Visits Date Type Provider Dept  02/23/21 Procedure visit Milinda Pointer, MD [facility]  02/20/21 Office Visit Milinda Pointer, MD [facility]  Showing recent visits within past 90 days and meeting all other requirements Today's Visits Date Type Provider Dept  03/09/21 Telemedicine Milinda Pointer, MD [facility]  Showing today's visits and meeting all other requirements Future Appointments Date Type Provider Dept  05/10/21 Appointment Milinda Pointer, MD [facility]  Showing future appointments within next 90 days and meeting all other requirements  I discussed the assessment and treatment plan with the patient. The patient was provided an opportunity to ask questions and all were answered. The patient agreed with the plan and demonstrated an understanding of the instructions.  Patient advised to call back or seek an in-person evaluation if the symptoms or condition worsens.  Duration of encounter: 15 minutes.  Note by: Gaspar Cola, MD Date: 03/09/2021; Time: 7:08 PM

## 2021-03-06 LAB — VMA, RANDOM URINE
Creatinine, Random U: 54.6 mg/dL
VMA, Random Urine: 1.3 mg/L
VMA/Crt, Random U: 2.4 mg/g{creat} (ref 0.0–6.0)

## 2021-03-08 ENCOUNTER — Telehealth: Payer: Self-pay | Admitting: *Deleted

## 2021-03-08 ENCOUNTER — Encounter: Payer: Self-pay | Admitting: Pain Medicine

## 2021-03-08 NOTE — Telephone Encounter (Signed)
Attempted to call for pre appointment review of allergies/meds. Message left. 

## 2021-03-09 ENCOUNTER — Ambulatory Visit: Payer: Managed Care, Other (non HMO) | Attending: Pain Medicine | Admitting: Pain Medicine

## 2021-03-09 ENCOUNTER — Other Ambulatory Visit: Payer: Self-pay

## 2021-03-09 DIAGNOSIS — G894 Chronic pain syndrome: Secondary | ICD-10-CM

## 2021-03-09 DIAGNOSIS — M1612 Unilateral primary osteoarthritis, left hip: Secondary | ICD-10-CM | POA: Diagnosis not present

## 2021-03-09 DIAGNOSIS — M7602 Gluteal tendinitis, left hip: Secondary | ICD-10-CM

## 2021-03-09 DIAGNOSIS — M7070 Other bursitis of hip, unspecified hip: Secondary | ICD-10-CM | POA: Insufficient documentation

## 2021-03-09 DIAGNOSIS — M67952 Unspecified disorder of synovium and tendon, left thigh: Secondary | ICD-10-CM

## 2021-03-09 DIAGNOSIS — M7072 Other bursitis of hip, left hip: Secondary | ICD-10-CM

## 2021-03-09 DIAGNOSIS — M25552 Pain in left hip: Secondary | ICD-10-CM | POA: Diagnosis not present

## 2021-03-09 DIAGNOSIS — M7062 Trochanteric bursitis, left hip: Secondary | ICD-10-CM | POA: Insufficient documentation

## 2021-03-09 DIAGNOSIS — G8929 Other chronic pain: Secondary | ICD-10-CM

## 2021-03-14 ENCOUNTER — Ambulatory Visit (INDEPENDENT_AMBULATORY_CARE_PROVIDER_SITE_OTHER): Payer: Managed Care, Other (non HMO) | Admitting: Family Medicine

## 2021-03-14 ENCOUNTER — Encounter: Payer: Self-pay | Admitting: Family Medicine

## 2021-03-14 ENCOUNTER — Other Ambulatory Visit: Payer: Self-pay

## 2021-03-14 VITALS — BP 120/78 | HR 112 | Temp 97.6°F | Ht 67.0 in | Wt 196.0 lb

## 2021-03-14 DIAGNOSIS — M25522 Pain in left elbow: Secondary | ICD-10-CM

## 2021-03-14 DIAGNOSIS — E1169 Type 2 diabetes mellitus with other specified complication: Secondary | ICD-10-CM

## 2021-03-14 DIAGNOSIS — I10 Essential (primary) hypertension: Secondary | ICD-10-CM

## 2021-03-14 DIAGNOSIS — G44021 Chronic cluster headache, intractable: Secondary | ICD-10-CM | POA: Diagnosis not present

## 2021-03-14 DIAGNOSIS — Z794 Long term (current) use of insulin: Secondary | ICD-10-CM

## 2021-03-14 DIAGNOSIS — G629 Polyneuropathy, unspecified: Secondary | ICD-10-CM

## 2021-03-14 LAB — POCT GLYCOSYLATED HEMOGLOBIN (HGB A1C): Hemoglobin A1C: 11.5 % — AB (ref 4.0–5.6)

## 2021-03-14 MED ORDER — PREGABALIN 225 MG PO CAPS: 225.0000 mg | ORAL_CAPSULE | Freq: Two times a day (BID) | ORAL | 1 refills | Status: DC

## 2021-03-14 MED ORDER — DAPAGLIFLOZIN PROPANEDIOL 10 MG PO TABS: 10.0000 mg | ORAL_TABLET | Freq: Every day | ORAL | 0 refills | Status: DC

## 2021-03-14 NOTE — Assessment & Plan Note (Signed)
Trauma from fall 1 month ago with persistent symptoms. Has soft tissue swelling but otherwise moving elbow with full ROM. Discussed trial of brace for possible tennis elbow given some point ttp. And if no improvement see Dr. Lorelei Pont

## 2021-03-14 NOTE — Patient Instructions (Signed)
Diabetes - increase Farxiga - Call Dr. Cordelia Pen office to make an appointment - see me in 3 months if unable to get in with the endocrinology  Elbow - get a tennis elbow brace and wear - if no improvement in 2 weeks see Dr. Lorelei Pont for f/u visit
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
## 2021-03-14 NOTE — Assessment & Plan Note (Signed)
BP at goal today. Appreciate cardiology support. Cont torsemide 20 mg, spironolactone 25 mg, losartan 100 mg, imdur 60 mg, coreg 37.5 mg bid, amlodipine 10 mg

## 2021-03-14 NOTE — Assessment & Plan Note (Signed)
On lyrica 225 mg BID with ok control of symptoms. Cont lyrica.

## 2021-03-14 NOTE — Assessment & Plan Note (Signed)
Poorly controlled. Suspect adherence is a major contributing factor - forgets Toujeo 1 time per week. Increase Farxiga 5>10 mg. Cont toujeo 110 units and lispro 2 units with meals. Emphasized importance of calling endocrine for follow-up. Daughter with ESRD and this has been occupying a lot of the patients time.

## 2021-03-15 ENCOUNTER — Other Ambulatory Visit: Payer: Self-pay | Admitting: Cardiovascular Disease

## 2021-03-15 MED ORDER — NITROGLYCERIN 0.4 MG SL SUBL: 0.4000 mg | SUBLINGUAL_TABLET | SUBLINGUAL | 1 refills | Status: DC | PRN

## 2021-03-16 ENCOUNTER — Telehealth (INDEPENDENT_AMBULATORY_CARE_PROVIDER_SITE_OTHER): Payer: Managed Care, Other (non HMO) | Admitting: Internal Medicine

## 2021-03-16 ENCOUNTER — Telehealth: Payer: Self-pay | Admitting: Physician Assistant

## 2021-03-16 ENCOUNTER — Encounter: Payer: Self-pay | Admitting: Internal Medicine

## 2021-03-16 DIAGNOSIS — I2510 Atherosclerotic heart disease of native coronary artery without angina pectoris: Secondary | ICD-10-CM | POA: Diagnosis not present

## 2021-03-16 DIAGNOSIS — E785 Hyperlipidemia, unspecified: Secondary | ICD-10-CM | POA: Diagnosis not present

## 2021-03-16 DIAGNOSIS — E1151 Type 2 diabetes mellitus with diabetic peripheral angiopathy without gangrene: Secondary | ICD-10-CM

## 2021-03-16 DIAGNOSIS — Z951 Presence of aortocoronary bypass graft: Secondary | ICD-10-CM

## 2021-03-16 DIAGNOSIS — I739 Peripheral vascular disease, unspecified: Secondary | ICD-10-CM | POA: Diagnosis not present

## 2021-03-16 DIAGNOSIS — Z720 Tobacco use: Secondary | ICD-10-CM

## 2021-03-16 NOTE — Patient Instructions (Signed)
Medication Instructions:  Your physician recommends that you continue on your current medications as directed. Please refer to the Current Medication list given to you today.  *If you need a refill on your cardiac medications before your next appointment, please call your pharmacy*   Lab Work: FASTING lipid panel   If you have labs (blood work) drawn today and your tests are completely normal, you will receive your results only by: Marland Kitchen MyChart Message (if you have MyChart) OR . A paper copy in the mail If you have any lab test that is abnormal or we need to change your treatment, we will call you to review the results.   Testing/Procedures: NONE   Follow-Up: At [facility], you and your health needs are our priority.  As part of our continuing mission to provide you with exceptional heart care, we have created designated Provider Care Teams.  These Care Teams include your primary Cardiologist (physician) and Advanced Practice Providers (APPs -  Physician Assistants and Nurse Practitioners) who all work together to provide you with the care you need, when you need it.  We recommend signing up for the patient portal called "MyChart".  Sign up information is provided on this After Visit Summary.  MyChart is used to connect with patients for Virtual Visits (Telemedicine).  Patients are able to view lab/test results, encounter notes, upcoming appointments, etc.  Non-urgent messages can be sent to your provider as well.   To learn more about what you can do with MyChart, go to [email or web address].    Your next appointment:   AS NEEDED - based on lab work

## 2021-03-16 NOTE — Progress Notes (Signed)
Virtual Visit via Video Note   This visit type was conducted due to national recommendations for restrictions regarding the COVID-19 Pandemic (e.g. social distancing) in an effort to limit this patient's exposure and mitigate transmission in our community.  Due to her co-morbid illnesses, this patient is at least at moderate risk for complications without adequate follow up.  This format is felt to be most appropriate for this patient at this time.  All issues noted in this document were discussed and addressed.  A limited physical exam was performed with this format.  Please refer to the patient's chart for her consent to telehealth for [facility].      Date:  03/16/2021   ID:  Erika [name], DOB Jan 11, 1971, MRN [id number] The patient was identified using 2 identifiers.  Evaluation Performed:  New Patient Evaluation  Patient Location:  [place] [address]  Provider location:   [address], [address], [place] [address]  PCP:  Lesleigh Noe, MD  Cardiologist:  [name], MD Electrophysiologist:  None   Chief Complaint:  Manage dyslipidemia  History of Present Illness:    Erika [name] is a 50 y.o. female who presents via audio/video conferencing for a telehealth visit today.  This is a pleasant 50 year old female kindly referred by Dr. Fletcher Anon for evaluation and management of dyslipidemia.  She has an unfortunate history of very early onset heart disease including PAD and coronary artery disease status post CABG.  There is a strong family history of heart disease particularly in her father.  She also notably has hypertension, nonalcoholic fatty liver disease, tobacco abuse and type 2 diabetes insulin.  Recently she had repeat lipids which demonstrated of 112 in November 2021, and TC 214, triglycerides 488, HDL 42.  1C at that time was poorly controlled at 10.3 and may explain her high triglycerides.  Her cardiologist/PA-C then  further adjusted her medications, starting her on Repatha 140 mg every 2 weeks and switching her statin to rosuvastatin 20 mg daily in December and January/February respectively.  She has not had repeat lipid testing since then.  The patient does not have symptoms concerning for COVID-19 infection (fever, chills, cough, or new SHORTNESS OF BREATH).    Prior CV studies:   The following studies were reviewed today:  Chart reviewed, lab work  PMHx:  Past Medical History:  Diagnosis Date  . Arthralgia of temporomandibular joint   . CAD, multiple vessel    a. cath 06/29/16: ostLM 40%, ostLAD 40%, pLAD 95%, ost-pLCx 60%, pLCx 95%, mLCx 60%, mRCA 95%, D2 50%, LVSF nl;  b. 07/2016 CABG x 4 (LIMA->LAD, VG->Diag, VG->OM, VG->RCA); c. 08/2016 Cath: 3VD w/ 4/4 patent grafts. LAD distal to LIMA has diff dzs->Med rx.  . Carotid arterial disease ([facility])    a. 07/2016 s/p R CEA.  . Clotting disorder ([facility])   . Depression   . Diastolic dysfunction    a. echo 06/28/16: EF 50-55%, mild inf wall HK, GR1DD, mild MR, RV sys fxn nl, mildly dilated LA, PASP nl  . Fatty liver disease, nonalcoholic 9390  . History of blood transfusion    with heart surgery  . HLD (hyperlipidemia)   . Labile hypertension    a. prior renal ngiogram negative for RAS in 03/2016; b. catecholamines and metanephrines normal, mildly elevated renin with normal aldosterone and normal ratio in 02/2016  . Myocardial infarction ([facility]) 2017  . Obesity   . PAD (peripheral artery disease) ([facility])  in both legs  . PTSD (post-traumatic stress disorder)   . Tobacco abuse    In 2018 - pt cut back from 2 ppd to 0.5 ppd.- still 1/2 ppd as of 04/22/20  . Type 2 diabetes mellitus [facility]) January 2017    Past Surgical History:  Procedure Laterality Date  . ABDOMINAL AORTOGRAM W/LOWER EXTREMITY [name] 10/15/2018   Procedure: ABDOMINAL AORTOGRAM W/LOWER EXTREMITY;  Surgeon: Wellington Hampshire, MD;  Location: [facility];  Service: Cardiovascular;   Laterality: [name];  . ABDOMINAL AORTOGRAM W/LOWER EXTREMITY Bilateral 08/19/2019   Procedure: ABDOMINAL AORTOGRAM W/LOWER EXTREMITY;  Surgeon: Wellington Hampshire, MD;  Location: [facility];  Service: Cardiovascular;  Laterality: Bilateral;  . CARDIAC CATHETERIZATION [name] 06/29/2016   Procedure: Left Heart Cath and Coronary Angiography;  Surgeon: Minna Merritts, MD;  Location: [facility];  Service: Cardiovascular;  Laterality: [name];  . CARDIAC CATHETERIZATION [name] 08/29/2016   Procedure: Left Heart Cath and Cors/Grafts Angiography;  Surgeon: Wellington Hampshire, MD;  Location: [facility];  Service: Cardiovascular;  Laterality: [name];  . CESAREAN SECTION    . CHOLECYSTECTOMY    . CORONARY ARTERY BYPASS GRAFT [name] 07/06/2016   Procedure: CORONARY ARTERY BYPASS GRAFTING (CABG) x four, using left internal mammary artery and right leg greater saphenous vein harvested endoscopically;  Surgeon: Ivin Poot, MD;  Location: [facility];  Service: Open Heart Surgery;  Laterality: [name];  . ENDARTERECTOMY Right 07/06/2016   Procedure: ENDARTERECTOMY CAROTID;  Surgeon: Rosetta Posner, MD;  Location: [facility];  Service: Vascular;  Laterality: Right;  . ENDARTERECTOMY Right 04/27/2020   Procedure: REDO OF RIGHT ENDARTERECTOMY CAROTID;  Surgeon: Rosetta Posner, MD;  Location: [facility];  Service: Vascular;  Laterality: Right;  . LEFT HEART CATH AND CORS/GRAFTS ANGIOGRAPHY [name] 08/24/2020   Procedure: LEFT HEART CATH AND CORS/GRAFTS ANGIOGRAPHY;  Surgeon: Wellington Hampshire, MD;  Location: [facility];  Service: Cardiovascular;  Laterality: [name];  . PERIPHERAL VASCULAR CATHETERIZATION [name] 04/18/2016   Procedure: Renal Angiography;  Surgeon: Wellington Hampshire, MD;  Location: [facility];  Service: Cardiovascular;  Laterality: [name];  . PERIPHERAL VASCULAR INTERVENTION Left 10/15/2018   Procedure: PERIPHERAL VASCULAR INTERVENTION;  Surgeon: Wellington Hampshire, MD;  Location: [facility];  Service:  Cardiovascular;  Laterality: Left;  Left superficial femoral  . TEE WITHOUT CARDIOVERSION [name] 07/06/2016   Procedure: TRANSESOPHAGEAL ECHOCARDIOGRAM (TEE);  Surgeon: Ivin Poot, MD;  Location: [facility];  Service: Open Heart Surgery;  Laterality: [name];  . TONSILLECTOMY      FAMHx:  Family History  Adopted: Yes  Problem Relation Age of Onset  . Diabetes Mother   . Diabetes Father   . Alcohol abuse Father   . Heart disease Father   . Drug abuse Father   . Stroke Sister   . Anxiety disorder Sister     SOCHx:   reports that she has been smoking cigarettes. She has a 13.50 pack-year smoking history. She has never used smokeless tobacco. She reports previous alcohol use. She reports that she does not use drugs.  ALLERGIES:  Allergies  Allergen Reactions  . Chantix [Varenicline Tartrate] Other (See Comments)    Feels "crazy" and angry     MEDS:  Current Meds  Medication Sig  . albuterol (PROVENTIL HFA;VENTOLIN HFA) 108 (90 Base) MCG/ACT inhaler Inhale 2 puffs into the lungs every 6 (six) hours as needed for wheezing.  Marland Kitchen amLODipine (NORVASC) 10 MG tablet  Take 1 tablet (10 mg total) by mouth daily.  Marland Kitchen aspirin EC 81 MG tablet Take 1 tablet (81 mg total) by mouth daily.  . benztropine (COGENTIN) 0.5 MG tablet Take 1 tablet (0.5 mg total) by mouth at bedtime.  . carvedilol (COREG) 25 MG tablet Take 1.5 tablets (37.5 mg total) by mouth 2 (two) times daily with a meal.  . chlorproMAZINE (THORAZINE) 100 MG tablet TAKE 1 TABLET BY MOUTH EVERYDAY AT BEDTIME  . clonazePAM (KLONOPIN) 0.5 MG tablet Take 1 tablet (0.5 mg total) by mouth 3 (three) times daily as needed for anxiety.  . cloNIDine (CATAPRES) 0.1 MG tablet Take 1 tablet (0.1 mg total) by mouth 2 (two) times daily.  . clopidogrel (PLAVIX) 75 MG tablet TAKE 1 TABLET BY MOUTH EVERY DAY  . dapagliflozin propanediol (FARXIGA) 10 MG TABS tablet Take 1 tablet (10 mg total) by mouth daily before breakfast.  . FLUoxetine HCl 60 MG TABS Take  60 mg by mouth daily.  . insulin glargine, 2 Unit Dial, (TOUJEO MAX SOLOSTAR) 300 UNIT/ML Solostar Pen Inject 110 Units into the skin every morning.  . insulin lispro (HUMALOG KWIKPEN) 100 UNIT/ML KwikPen Inject 3 Units into the skin 3 (three) times daily with meals. Take only if blood sugar is over 250.  Also pen needles 4/day  . Insulin Syringe-Needle U-100 29G X 1/2" 0.3 ML MISC 1 each by Does not apply route 2 (two) times daily.  . isosorbide mononitrate (IMDUR) 60 MG 24 hr tablet TAKE 1 TABLET BY MOUTH IN THE MORNING AND 1/2 TABLET IN THE EVENING  . lamoTRIgine (LAMICTAL) 200 MG tablet Take 1 tablet (200 mg total) by mouth at bedtime.  Marland Kitchen losartan (COZAAR) 100 MG tablet TAKE ONE TABLET BY MOUTH ONE TIME DAILY  . naloxone (NARCAN) nasal spray 4 mg/0.1 mL 4 mg (contents of 1 nasal spray) as a single dose in one nostril; may repeat every 2 to 3 minutes in alternating nostrils until medical assistance becomes available (Patient taking differently: Place 1 spray into the nose See admin instructions. 4 mg (contents of 1 nasal spray) as a single dose in one nostril; may repeat every 2 to 3 minutes in alternating nostrils until medical assistance becomes available)  . nicotine (NICODERM CQ - DOSED IN MG/24 HOURS) 14 mg/24hr patch Place 14 mg onto the skin daily.   . nitroGLYCERIN (NITROSTAT) 0.4 MG SL tablet Place 1 tablet (0.4 mg total) under the tongue every 5 (five) minutes as needed for chest pain.  Marland Kitchen ondansetron (ZOFRAN) 4 MG tablet Take 1 tablet (4 mg total) by mouth daily as needed. (Patient taking differently: Take 4 mg by mouth daily as needed for nausea or vomiting.)  . oxyCODONE (OXY IR/ROXICODONE) 5 MG immediate release tablet Take 1 tablet (5 mg total) by mouth 2 (two) times daily as needed for severe pain. Must last 30 days.  Derrill Memo ON 03/25/2021] oxyCODONE (OXY IR/ROXICODONE) 5 MG immediate release tablet Take 1 tablet (5 mg total) by mouth 2 (two) times daily as needed for severe pain. Must  last 30 days.  Derrill Memo ON 04/24/2021] oxyCODONE (OXY IR/ROXICODONE) 5 MG immediate release tablet Take 1 tablet (5 mg total) by mouth 2 (two) times daily as needed for severe pain. Must last 30 days.  Derrill Memo ON 05/03/2021] pregabalin (LYRICA) 225 MG capsule Take 1 capsule (225 mg total) by mouth 2 (two) times daily.  Marland Kitchen REPATHA SURECLICK 865 MG/ML SOAJ INJECT 1 PEN INTO THE SKIN EVERY 14 (FOURTEEN)  DAYS.  . rosuvastatin (CRESTOR) 20 MG tablet Take 1 tablet (20 mg total) by mouth daily.  Marland Kitchen spironolactone (ALDACTONE) 25 MG tablet Take 1 tablet (25 mg total) by mouth daily.  Marland Kitchen tobramycin-dexamethasone (TOBRADEX) ophthalmic solution Place 2 drops into the left eye every 4 (four) hours while awake.  . torsemide (DEMADEX) 20 MG tablet Take 1 tablet (20 mg total) by mouth 2 (two) times daily.     ROS: Pertinent items noted in HPI and remainder of comprehensive ROS otherwise negative.  Labs/Other Tests and Data Reviewed:    Recent Labs: 11/21/2020: ALT 36; Hemoglobin 15.5; Platelets 404.0; TSH 1.26 01/24/2021: BUN 7; Creatinine, Ser 0.77; Potassium 4.4; Sodium 136   Recent Lipid Panel Lab Results  Component Value Date/Time   CHOL 214 (H) 11/21/2020 03:14 PM   CHOL 121 08/21/2016 08:17 AM   TRIG (H) 11/21/2020 03:14 PM    488.0 Triglyceride is over 400; calculations on Lipids are invalid.   HDL 42.80 11/21/2020 03:14 PM   HDL 24 (L) 08/21/2016 08:17 AM   CHOLHDL 5 11/21/2020 03:14 PM   LDLCALC 80 09/07/2019 08:46 AM   LDLCALC 64 08/21/2016 08:17 AM   LDLDIRECT 112.0 11/21/2020 03:14 PM    Wt Readings from Last 3 Encounters:  03/14/21 196 lb (88.9 kg)  02/23/21 196 lb (88.9 kg)  02/20/21 196 lb (88.9 kg)     Exam:    Vital Signs:  LMP 02/18/2021 (Exact Date)    General appearance: alert and no distress Lungs: No audible wheezing Abdomen: Mildly overweight Extremities: extremities normal, atraumatic, no cyanosis or edema Skin: Skin color, texture, turgor normal. No rashes or  lesions Neurologic: Grossly normal Psych: Pleasant  ASSESSMENT & PLAN:    1. Mixed dyslipidemia with high triglycerides, goal LDL less than 70 2. Coronary artery disease status post CABG 3. PAD with occluded right SFA 4. Type 2 diabetes on insulin 5. Tobacco abuse 6. Family history of premature coronary disease  Mrs. Bunte has a significant dyslipidemia with high triglycerides.  Her A1c was also poorly controlled 3 months ago.  This could potentially improve with better glycemic control.  I think she is a great candidate for Vascepa based on the 2019 REDUCE-IT trial, however this is based on data from November and after subsequently starting her on Repatha and switching to rosuvastatin, is very likely that her lipids are now much better controlled.  I would like to recheck her cholesterol and make further adjustments based on that.  She is on optimal antidiabetic therapy including insulin and Farxiga, which has a cardiovascular indication.  Thanks again for the kind referral  COVID-19 Education: The signs and symptoms of COVID-19 were discussed with the patient and how to seek care for testing (follow up with PCP or arrange E-visit).  The importance of social distancing was discussed today.  Patient Risk:   After full review of this patients clinical status, I feel that they are at least moderate risk at this time.  Time:   Today, I have spent 25 minutes with the patient with telehealth technology discussing dyslipidemia, coronary disease, PAD, type 2 diabetes, cardiovascular risk.     Medication Adjustments/Labs and Tests Ordered: Current medicines are reviewed at length with the patient today.  Concerns regarding medicines are outlined above.   Tests Ordered: No orders of the defined types were placed in this encounter.   Medication Changes: No orders of the defined types were placed in this encounter.   Disposition:  in 3 month(s)  Pixie Casino, MD, [facility], [place] Director of the Advanced Lipid Disorders &  Cardiovascular Risk Reduction Clinic Diplomate of the American Board of Clinical Lipidology Attending Cardiologist  Direct Dial: [phone]  Fax: [phone]  Website:  [email or web address]  Pixie Casino, MD  03/16/2021 2:11 PM

## 2021-03-16 NOTE — Telephone Encounter (Signed)
  Patient Consent for Virtual Visit         Erika [name] has provided verbal consent on 03/16/2021 for a virtual visit (video or telephone).   CONSENT FOR VIRTUAL VISIT FOR:  Erika [name]  By participating in this virtual visit I agree to the following:  I hereby voluntarily request, consent and authorize [facility] and its employed or contracted physicians, physician assistants, nurse practitioners or other licensed health care professionals (the Practitioner), to provide me with telemedicine health care services (the "Services") as deemed necessary by the treating Practitioner. I acknowledge and consent to receive the Services by the Practitioner via telemedicine. I understand that the telemedicine visit will involve communicating with the Practitioner through live audiovisual communication technology and the disclosure of certain medical information by electronic transmission. I acknowledge that I have been given the opportunity to request an in-person assessment or other available alternative prior to the telemedicine visit and am voluntarily participating in the telemedicine visit.  I understand that I have the right to withhold or withdraw my consent to the use of telemedicine in the course of my care at any time, without affecting my right to future care or treatment, and that the Practitioner or I may terminate the telemedicine visit at any time. I understand that I have the right to inspect all information obtained and/or recorded in the course of the telemedicine visit and may receive copies of available information for a reasonable fee.  I understand that some of the potential risks of receiving the Services via telemedicine include:  Marland Kitchen Delay or interruption in medical evaluation due to technological equipment failure or disruption; . Information transmitted may not be sufficient (e.g. poor resolution of images) to allow for appropriate medical decision making by the  Practitioner; and/or  . In rare instances, security protocols could fail, causing a breach of personal health information.  Furthermore, I acknowledge that it is my responsibility to provide information about my medical history, conditions and care that is complete and accurate to the best of my ability. I acknowledge that Practitioner's advice, recommendations, and/or decision may be based on factors not within their control, such as incomplete or inaccurate data provided by me or distortions of diagnostic images or specimens that may result from electronic transmissions. I understand that the practice of medicine is not an exact science and that Practitioner makes no warranties or guarantees regarding treatment outcomes. I acknowledge that a copy of this consent can be made available to me via my patient portal ([facility]), or I can request a printed copy by calling the office of [facility].    I understand that my insurance will be billed for this visit.   I have read or had this consent read to me. . I understand the contents of this consent, which adequately explains the benefits and risks of the Services being provided via telemedicine.  . I have been provided ample opportunity to ask questions regarding this consent and the Services and have had my questions answered to my satisfaction. . I give my informed consent for the services to be provided through the use of telemedicine in my medical care

## 2021-03-18 ENCOUNTER — Telehealth: Payer: Self-pay

## 2021-03-18 NOTE — Telephone Encounter (Signed)
Please contact to schedule an OV before she gets anymore refills on Toujeo Maxx  Thank you,  Leamon Arnt

## 2021-03-20 ENCOUNTER — Other Ambulatory Visit (INDEPENDENT_AMBULATORY_CARE_PROVIDER_SITE_OTHER): Payer: Managed Care, Other (non HMO)

## 2021-03-20 ENCOUNTER — Ambulatory Visit (INDEPENDENT_AMBULATORY_CARE_PROVIDER_SITE_OTHER): Payer: Managed Care, Other (non HMO) | Admitting: Vascular Surgery

## 2021-03-20 ENCOUNTER — Other Ambulatory Visit (HOSPITAL_COMMUNITY): Payer: Self-pay | Admitting: Vascular Surgery

## 2021-03-20 ENCOUNTER — Other Ambulatory Visit: Payer: Self-pay

## 2021-03-20 ENCOUNTER — Encounter: Payer: Self-pay | Admitting: Vascular Surgery

## 2021-03-20 VITALS — BP 144/94 | HR 117 | Temp 98.8°F | Resp 16 | Ht 67.0 in | Wt 194.0 lb

## 2021-03-20 DIAGNOSIS — I739 Peripheral vascular disease, unspecified: Secondary | ICD-10-CM

## 2021-03-20 DIAGNOSIS — I779 Disorder of arteries and arterioles, unspecified: Secondary | ICD-10-CM

## 2021-03-20 DIAGNOSIS — I6521 Occlusion and stenosis of right carotid artery: Secondary | ICD-10-CM | POA: Diagnosis not present

## 2021-03-20 MED ORDER — AMLODIPINE BESYLATE 10 MG PO TABS: 10.0000 mg | ORAL_TABLET | Freq: Every day | ORAL | 1 refills | Status: DC

## 2021-03-20 NOTE — Telephone Encounter (Signed)
LMTCB to schedule follow-up appointment.  

## 2021-03-20 NOTE — Telephone Encounter (Signed)
Patient is scheduled for appointment on 03/21/21

## 2021-03-20 NOTE — Progress Notes (Signed)
[facility] [place]  Patient name: Erika [name] MRN: [id number] DOB: 22-Jul-1971 Sex: female  REASON FOR VISIT: Follow-up diffuse peripheral vascular occlusive disease  HPI: Erika [name] is a 50 y.o. female here today for follow-up.  She has an extensive history of peripheral vascular occlusive disease.  She underwent right carotid endarterectomy for critical asymptomatic carotid disease in conjunction with coronary artery bypass grafting in 2017.  She has undergone staged bilateral superficial femoral artery angioplasty and stenting with Dr. Fletcher Anon for limiting claudication.  She has had recurrence of her SFA occlusions bilaterally.  On surveillance she was also found to have recurrent of her right carotid stenosis with critical asymptomatic disease.  She underwent redo right carotid endarterectomy with myself on 04/27/2020.  She has had no difficulty from neuro logical standpoint since then.  She does report limiting claudication.  She reports that she is able to do much walking in her home without having claudication symptoms.  Fortunately she has had no rest pain and no tissue loss.  Past Medical History:  Diagnosis Date  . Arthralgia of temporomandibular joint   . CAD, multiple vessel    a. cath 06/29/16: ostLM 40%, ostLAD 40%, pLAD 95%, ost-pLCx 60%, pLCx 95%, mLCx 60%, mRCA 95%, D2 50%, LVSF nl;  b. 07/2016 CABG x 4 (LIMA->LAD, VG->Diag, VG->OM, VG->RCA); c. 08/2016 Cath: 3VD w/ 4/4 patent grafts. LAD distal to LIMA has diff dzs->Med rx.  . Carotid arterial disease ([place])    a. 07/2016 s/p R CEA.  . Clotting disorder ([place])   . Depression   . Diastolic dysfunction    a. echo 06/28/16: EF 50-55%, mild inf wall HK, GR1DD, mild MR, RV sys fxn nl, mildly dilated LA, PASP nl  . Fatty liver disease, nonalcoholic 1991  . History of blood transfusion    with heart surgery  . HLD (hyperlipidemia)   . Labile hypertension    a. prior  renal ngiogram negative for RAS in 03/2016; b. catecholamines and metanephrines normal, mildly elevated renin with normal aldosterone and normal ratio in 02/2016  . Myocardial infarction ([place]) 2017  . Obesity   . PAD (peripheral artery disease) (HCC)    in both legs  . PTSD (post-traumatic stress disorder)   . Tobacco abuse    In 2018 - pt cut back from 2 ppd to 0.5 ppd.- still 1/2 ppd as of 04/22/20  . Type 2 diabetes mellitus [facility]) January 2017    Family History  Adopted: Yes  Problem Relation Age of Onset  . Diabetes Mother   . Diabetes Father   . Alcohol abuse Father   . Heart disease Father   . Drug abuse Father   . Stroke Sister   . Anxiety disorder Sister     SOCIAL HISTORY: Social History   Tobacco Use  . Smoking status: Current Every Day Smoker    Packs/day: 0.50    Years: 27.00    Pack years: 13.50    Types: Cigarettes  . Smokeless tobacco: Never Used  Substance Use Topics  . Alcohol use: Not Currently    Alcohol/week: 0.0 standard drinks    Comment: socially    Allergies  Allergen Reactions  . Chantix [Varenicline Tartrate] Other (See Comments)    Feels "crazy" and angry     Current Outpatient Medications  Medication Sig Dispense Refill  . albuterol (PROVENTIL HFA;VENTOLIN HFA) 108 (90 Base) MCG/ACT inhaler Inhale 2 puffs into the lungs every 6 (six) hours as  needed for wheezing. 1 Inhaler 0  . amLODipine (NORVASC) 10 MG tablet Take 1 tablet (10 mg total) by mouth daily. 90 tablet 0  . aspirin EC 81 MG tablet Take 1 tablet (81 mg total) by mouth daily. 90 tablet 3  . benztropine (COGENTIN) 0.5 MG tablet Take 1 tablet (0.5 mg total) by mouth at bedtime. 90 tablet 0  . carvedilol (COREG) 25 MG tablet Take 1.5 tablets (37.5 mg total) by mouth 2 (two) times daily with a meal. 90 tablet 5  . chlorproMAZINE (THORAZINE) 100 MG tablet TAKE 1 TABLET BY MOUTH EVERYDAY AT BEDTIME 90 tablet 0  . clonazePAM (KLONOPIN) 0.5 MG tablet Take 1 tablet (0.5 mg total) by  mouth 3 (three) times daily as needed for anxiety. 90 tablet 2  . cloNIDine (CATAPRES) 0.1 MG tablet Take 1 tablet (0.1 mg total) by mouth 2 (two) times daily. 180 tablet 0  . clopidogrel (PLAVIX) 75 MG tablet TAKE 1 TABLET BY MOUTH EVERY DAY 30 tablet 3  . dapagliflozin propanediol (FARXIGA) 10 MG TABS tablet Take 1 tablet (10 mg total) by mouth daily before breakfast. 90 tablet 0  . FLUoxetine HCl 60 MG TABS Take 60 mg by mouth daily. 90 tablet 0  . insulin glargine, 2 Unit Dial, (TOUJEO MAX SOLOSTAR) 300 UNIT/ML Solostar Pen Inject 110 Units into the skin every morning. 4 pen 11  . insulin lispro (HUMALOG KWIKPEN) 100 UNIT/ML KwikPen Inject 3 Units into the skin 3 (three) times daily with meals. Take only if blood sugar is over 250.  Also pen needles 4/day 15 mL 1  . Insulin Syringe-Needle U-100 29G X 1/2" 0.3 ML MISC 1 each by Does not apply route 2 (two) times daily. 30 each 0  . isosorbide mononitrate (IMDUR) 60 MG 24 hr tablet TAKE 1 TABLET BY MOUTH IN THE MORNING AND 1/2 TABLET IN THE EVENING 135 tablet 0  . lamoTRIgine (LAMICTAL) 200 MG tablet Take 1 tablet (200 mg total) by mouth at bedtime. 90 tablet 0  . losartan (COZAAR) 100 MG tablet TAKE ONE TABLET BY MOUTH ONE TIME DAILY 90 tablet 0  . naloxone (NARCAN) nasal spray 4 mg/0.1 mL 4 mg (contents of 1 nasal spray) as a single dose in one nostril; may repeat every 2 to 3 minutes in alternating nostrils until medical assistance becomes available (Patient taking differently: Place 1 spray into the nose See admin instructions. 4 mg (contents of 1 nasal spray) as a single dose in one nostril; may repeat every 2 to 3 minutes in alternating nostrils until medical assistance becomes available) 1 kit 0  . nicotine (NICODERM CQ - DOSED IN MG/24 HOURS) 14 mg/24hr patch Place 14 mg onto the skin daily.     . nitroGLYCERIN (NITROSTAT) 0.4 MG SL tablet Place 1 tablet (0.4 mg total) under the tongue every 5 (five) minutes as needed for chest pain. 25 tablet  1  . ondansetron (ZOFRAN) 4 MG tablet Take 1 tablet (4 mg total) by mouth daily as needed. (Patient taking differently: Take 4 mg by mouth daily as needed for nausea or vomiting.) 20 tablet 11  . oxyCODONE (OXY IR/ROXICODONE) 5 MG immediate release tablet Take 1 tablet (5 mg total) by mouth 2 (two) times daily as needed for severe pain. Must last 30 days. 60 tablet 0  . [START ON 03/25/2021] oxyCODONE (OXY IR/ROXICODONE) 5 MG immediate release tablet Take 1 tablet (5 mg total) by mouth 2 (two) times daily as needed for severe pain. Must  last 30 days. 60 tablet 0  . [START ON 04/24/2021] oxyCODONE (OXY IR/ROXICODONE) 5 MG immediate release tablet Take 1 tablet (5 mg total) by mouth 2 (two) times daily as needed for severe pain. Must last 30 days. 60 tablet 0  . [START ON 05/03/2021] pregabalin (LYRICA) 225 MG capsule Take 1 capsule (225 mg total) by mouth 2 (two) times daily. 180 capsule 1  . REPATHA SURECLICK 086 MG/ML SOAJ INJECT 1 PEN INTO THE SKIN EVERY 14 (FOURTEEN) DAYS. 2 mL 3  . rosuvastatin (CRESTOR) 20 MG tablet Take 1 tablet (20 mg total) by mouth daily. 30 tablet 11  . spironolactone (ALDACTONE) 25 MG tablet Take 1 tablet (25 mg total) by mouth daily. 90 tablet 0  . torsemide (DEMADEX) 20 MG tablet Take 1 tablet (20 mg total) by mouth 2 (two) times daily. 30 tablet 5  . tobramycin-dexamethasone (TOBRADEX) ophthalmic solution Place 2 drops into the left eye every 4 (four) hours while awake. (Patient not taking: Reported on 03/20/2021) 5 mL 0   No current facility-administered medications for this visit.    REVIEW OF SYSTEMS:  $RemoveB'[X]'caGfBjyT$  denotes positive finding, $RemoveBeforeDEI'[ ]'cVPwihjTZOpbQunr$  denotes negative finding Cardiac  Comments:  Chest pain or chest pressure:    Shortness of breath upon exertion:    Short of breath when lying flat:    Irregular heart rhythm:        Vascular    Pain in calf, thigh, or hip brought on by ambulation: x   Pain in feet at night that wakes you up from your sleep:     Blood clot in your  veins:    Leg swelling:           PHYSICAL EXAM: Vitals:   03/20/21 1326 03/20/21 1330  BP: (!) 172/102 (!) 144/94  Pulse: (!) 119 (!) 117  Resp: 16   Temp: 98.8 F (37.1 C)   TempSrc: Temporal   SpO2: 93%   Weight: 194 lb (88 kg)   Height: $Remove'5\' 7"'uwvcVzu$  (1.702 m)     GENERAL: The patient is a well-nourished female, in no acute distress. The vital signs are documented above. CARDIOVASCULAR: Right neck incision well-healed with no carotid bruits bilaterally.  Absent pedal pulses bilaterally. PULMONARY: There is good air exchange  MUSCULOSKELETAL: There are no major deformities or cyanosis. NEUROLOGIC: No focal weakness or paresthesias are detected. SKIN: There are no ulcers or rashes noted. PSYCHIATRIC: The patient has a normal affect.  DATA:  Carotid duplex today shows some thickening in her endarterectomy with 40 to 59% narrowing.  No significant stenosis in the left carotid artery.  Lower extremity arterial studies reveal ankle arm index on the right 0.61 in the left 1.46  MEDICAL ISSUES: Inferior diffuse peripheral vascular occlusive disease.  10 months status post redo right carotid endarterectomy with moderate thickening causing moderate stenosis.  Patient also with claudication both lower extremities.  I certainly would recommend extremely conservative approach regarding her claudication and would not recommend any consideration of intervention unless she had rest pain or tissue loss.  We will see her again in 1 year for repeat carotid duplex.  I again stressed on her the critical importance of smoking cessation.  She reports that she is trying.    Rosetta Posner, MD FACS [facility] [phone]  Note: Portions of this report may have been transcribed using voice recognition software.  Every effort has been made to ensure accuracy; however, inadvertent computerized transcription  errors may still be present.

## 2021-03-21 ENCOUNTER — Ambulatory Visit (INDEPENDENT_AMBULATORY_CARE_PROVIDER_SITE_OTHER): Payer: Managed Care, Other (non HMO) | Admitting: Endocrinology

## 2021-03-21 VITALS — BP 136/80 | HR 99 | Ht 67.0 in | Wt 198.6 lb

## 2021-03-21 DIAGNOSIS — IMO0002 Reserved for concepts with insufficient information to code with codable children: Secondary | ICD-10-CM

## 2021-03-21 DIAGNOSIS — E0865 Diabetes mellitus due to underlying condition with hyperglycemia: Secondary | ICD-10-CM

## 2021-03-21 DIAGNOSIS — E1169 Type 2 diabetes mellitus with other specified complication: Secondary | ICD-10-CM

## 2021-03-21 DIAGNOSIS — Z794 Long term (current) use of insulin: Secondary | ICD-10-CM | POA: Diagnosis not present

## 2021-03-21 DIAGNOSIS — E0859 Diabetes mellitus due to underlying condition with other circulatory complications: Secondary | ICD-10-CM | POA: Diagnosis not present

## 2021-03-21 MED ORDER — TRULICITY 0.75 MG/0.5ML ~~LOC~~ SOAJ: 0.7500 mg | SUBCUTANEOUS | 3 refills | Status: DC

## 2021-03-21 MED ORDER — BASAGLAR KWIKPEN 100 UNIT/ML ~~LOC~~ SOPN: 120.0000 [IU] | PEN_INJECTOR | SUBCUTANEOUS | 3 refills | Status: DC

## 2021-03-21 NOTE — Patient Instructions (Addendum)
Your blood pressure and heart rate are high today.  Please see your primary care provider soon, to have these rechecked.   I have sent 2 prescriptions to your pharmacy: to change to basaglar, and to add Trulicity. If you get more nausea, we have to abandon the Trulicity.   Please continue the same other diabetes medications Please come back for a follow-up appointment in 2 months.  check your blood sugar twice a day.  vary the time of day when you check, between before the 3 meals, and at bedtime.  also check if you have symptoms of your blood sugar being too high or too low.  please keep a record of the readings and bring it to your next appointment here (or you can bring the meter itself).  You can write it on any piece of paper.  please call us sooner if your blood sugar goes below 70, or if you have a lot of readings over 200.

## 2021-03-21 NOTE — Progress Notes (Signed)
Subjective:    Patient ID: Erika [name], female    DOB: 1971/11/15, 50 y.o.   MRN: [id number]  HPI Pt returns for f/u of diabetes mellitus:  DM type: Insulin-requiring type 2 Dx'ed: 5176 Complications: CAD and PAD.  Therapy: insulin since 2017, and Farxiga.   GDM: never DKA: never Severe hypoglycemia: never Pancreatitis: never Pancreatic imaging: normal on 2010 CT SDOH: due to noncompliance with insulin, she is not a candidate for multiple daily injections.   Other: she is not a candidate for GLP med, due to chronic nausea.   Interval history: no cbg record, but states cbg's vary from 200-300's.  Pt says she sometimes misses meds.  She is overdue for f/u.  pt states she feels well in general.  She does not know Toujeo dosage, but she wants to change to a different insulin.  She declines Trulicity, as she says it caused her dtr to have diarrhea.   Past Medical History:  Diagnosis Date  . Arthralgia of temporomandibular joint   . CAD, multiple vessel    a. cath 06/29/16: ostLM 40%, ostLAD 40%, pLAD 95%, ost-pLCx 60%, pLCx 95%, mLCx 60%, mRCA 95%, D2 50%, LVSF nl;  b. 07/2016 CABG x 4 (LIMA->LAD, VG->Diag, VG->OM, VG->RCA); c. 08/2016 Cath: 3VD w/ 4/4 patent grafts. LAD distal to LIMA has diff dzs->Med rx.  . Carotid arterial disease ([place])    a. 07/2016 s/p R CEA.  . Clotting disorder ([place])   . Depression   . Diastolic dysfunction    a. echo 06/28/16: EF 50-55%, mild inf wall HK, GR1DD, mild MR, RV sys fxn nl, mildly dilated LA, PASP nl  . Fatty liver disease, nonalcoholic 1607  . History of blood transfusion    with heart surgery  . HLD (hyperlipidemia)   . Labile hypertension    a. prior renal ngiogram negative for RAS in 03/2016; b. catecholamines and metanephrines normal, mildly elevated renin with normal aldosterone and normal ratio in 02/2016  . Myocardial infarction ([place]) 2017  . Obesity   . PAD (peripheral artery disease) (HCC)    in both legs  . PTSD (post-traumatic  stress disorder)   . Tobacco abuse    In 2018 - pt cut back from 2 ppd to 0.5 ppd.- still 1/2 ppd as of 04/22/20  . Type 2 diabetes mellitus [facility]) January 2017    Past Surgical History:  Procedure Laterality Date  . ABDOMINAL AORTOGRAM W/LOWER EXTREMITY [name] 10/15/2018   Procedure: ABDOMINAL AORTOGRAM W/LOWER EXTREMITY;  Surgeon: Wellington Hampshire, MD;  Location: [place] [facility];  Service: Cardiovascular;  Laterality: [name];  . ABDOMINAL AORTOGRAM W/LOWER EXTREMITY Bilateral 08/19/2019   Procedure: ABDOMINAL AORTOGRAM W/LOWER EXTREMITY;  Surgeon: Wellington Hampshire, MD;  Location: [place] [facility];  Service: Cardiovascular;  Laterality: Bilateral;  . CARDIAC CATHETERIZATION [name] 06/29/2016   Procedure: Left Heart Cath and Coronary Angiography;  Surgeon: Minna Merritts, MD;  Location: [place] [facility];  Service: Cardiovascular;  Laterality: [name];  . CARDIAC CATHETERIZATION [name] 08/29/2016   Procedure: Left Heart Cath and Cors/Grafts Angiography;  Surgeon: Wellington Hampshire, MD;  Location: [place] [facility];  Service: Cardiovascular;  Laterality: [name];  . CESAREAN SECTION    . CHOLECYSTECTOMY    . CORONARY ARTERY BYPASS GRAFT [name] 07/06/2016   Procedure: CORONARY ARTERY BYPASS GRAFTING (CABG) x four, using left internal mammary artery and right leg greater saphenous vein harvested endoscopically;  Surgeon: Ivin Poot, MD;  Location: [place];  Service:  Open Heart Surgery;  Laterality: [name];  . ENDARTERECTOMY Right 07/06/2016   Procedure: ENDARTERECTOMY CAROTID;  Surgeon: Rosetta Posner, MD;  Location: [place];  Service: Vascular;  Laterality: Right;  . ENDARTERECTOMY Right 04/27/2020   Procedure: REDO OF RIGHT ENDARTERECTOMY CAROTID;  Surgeon: Rosetta Posner, MD;  Location: [place];  Service: Vascular;  Laterality: Right;  . LEFT HEART CATH AND CORS/GRAFTS ANGIOGRAPHY [name] 08/24/2020   Procedure: LEFT HEART CATH AND CORS/GRAFTS ANGIOGRAPHY;  Surgeon: Wellington Hampshire, MD;  Location: [place] [facility];   Service: Cardiovascular;  Laterality: [name];  . PERIPHERAL VASCULAR CATHETERIZATION [name] 04/18/2016   Procedure: Renal Angiography;  Surgeon: Wellington Hampshire, MD;  Location: [place] [facility];  Service: Cardiovascular;  Laterality: [name];  . PERIPHERAL VASCULAR INTERVENTION Left 10/15/2018   Procedure: PERIPHERAL VASCULAR INTERVENTION;  Surgeon: Wellington Hampshire, MD;  Location: [place] [facility];  Service: Cardiovascular;  Laterality: Left;  Left superficial femoral  . TEE WITHOUT CARDIOVERSION [name] 07/06/2016   Procedure: TRANSESOPHAGEAL ECHOCARDIOGRAM (TEE);  Surgeon: Ivin Poot, MD;  Location: [place];  Service: Open Heart Surgery;  Laterality: [name];  . TONSILLECTOMY      Social History   Socioeconomic History  . Marital status: Married    Spouse name: Not on file  . Number of children: 1  . Years of education: 41  . Highest education level: Not on file  Occupational History  . Occupation: Disability  Tobacco Use  . Smoking status: Current Every Day Smoker    Packs/day: 0.50    Years: 27.00    Pack years: 13.50    Types: Cigarettes  . Smokeless tobacco: Never Used  Vaping Use  . Vaping Use: Former  Substance and Sexual Activity  . Alcohol use: Not Currently    Alcohol/week: 0.0 standard drinks    Comment: socially  . Drug use: No  . Sexual activity: Yes    Partners: Male    Birth control/protection: None  Other Topics Concern  . Not on file  Social History Narrative   11/21/20   From: [name]   Living: with husband, Catalina Antigua (2009) and special needs daughter    Work: disability       Family: daughter - [name] (special needs, lives with her) and 2 grown step children - [name] and [name]       Enjoys: play with dogs - 2 german Shepard, mut, and blue tick walker mix      Exercise: PAD limits exercise   Diet: diabetic diet and low potassium due to daughter      Safety   Seat belts: Yes    Guns: Yes  and secure   Safe in relationships: Yes    Social  Determinants of Health   Financial Resource Strain: Not on file  Food Insecurity: Not on file  Transportation Needs: Not on file  Physical Activity: Not on file  Stress: Not on file  Social Connections: Not on file  Intimate Partner Violence: Not on file    Current Outpatient Medications on File Prior to Visit  Medication Sig Dispense Refill  . albuterol (PROVENTIL HFA;VENTOLIN HFA) 108 (90 Base) MCG/ACT inhaler Inhale 2 puffs into the lungs every 6 (six) hours as needed for wheezing. 1 Inhaler 0  . amLODipine (NORVASC) 10 MG tablet Take 1 tablet (10 mg total) by mouth daily. 90 tablet 1  . aspirin EC 81 MG tablet Take 1 tablet (81 mg total) by mouth daily. 90 tablet 3  .  benztropine (COGENTIN) 0.5 MG tablet Take 1 tablet (0.5 mg total) by mouth at bedtime. 90 tablet 0  . carvedilol (COREG) 25 MG tablet Take 1.5 tablets (37.5 mg total) by mouth 2 (two) times daily with a meal. 90 tablet 5  . chlorproMAZINE (THORAZINE) 100 MG tablet TAKE 1 TABLET BY MOUTH EVERYDAY AT BEDTIME 90 tablet 0  . clonazePAM (KLONOPIN) 0.5 MG tablet Take 1 tablet (0.5 mg total) by mouth 3 (three) times daily as needed for anxiety. 90 tablet 2  . cloNIDine (CATAPRES) 0.1 MG tablet Take 1 tablet (0.1 mg total) by mouth 2 (two) times daily. 180 tablet 0  . clopidogrel (PLAVIX) 75 MG tablet TAKE 1 TABLET BY MOUTH EVERY DAY 30 tablet 3  . dapagliflozin propanediol (FARXIGA) 10 MG TABS tablet Take 1 tablet (10 mg total) by mouth daily before breakfast. 90 tablet 0  . FLUoxetine HCl 60 MG TABS Take 60 mg by mouth daily. 90 tablet 0  . insulin lispro (HUMALOG KWIKPEN) 100 UNIT/ML KwikPen Inject 3 Units into the skin 3 (three) times daily with meals. Take only if blood sugar is over 250.  Also pen needles 4/day 15 mL 1  . Insulin Syringe-Needle U-100 29G X 1/2" 0.3 ML MISC 1 each by Does not apply route 2 (two) times daily. 30 each 0  . isosorbide mononitrate (IMDUR) 60 MG 24 hr tablet TAKE 1 TABLET BY MOUTH IN THE MORNING  AND 1/2 TABLET IN THE EVENING 135 tablet 0  . lamoTRIgine (LAMICTAL) 200 MG tablet Take 1 tablet (200 mg total) by mouth at bedtime. 90 tablet 0  . losartan (COZAAR) 100 MG tablet TAKE ONE TABLET BY MOUTH ONE TIME DAILY 90 tablet 0  . naloxone (NARCAN) nasal spray 4 mg/0.1 mL 4 mg (contents of 1 nasal spray) as a single dose in one nostril; may repeat every 2 to 3 minutes in alternating nostrils until medical assistance becomes available (Patient taking differently: Place 1 spray into the nose See admin instructions. 4 mg (contents of 1 nasal spray) as a single dose in one nostril; may repeat every 2 to 3 minutes in alternating nostrils until medical assistance becomes available) 1 kit 0  . nicotine (NICODERM CQ - DOSED IN MG/24 HOURS) 14 mg/24hr patch Place 14 mg onto the skin daily.     . nitroGLYCERIN (NITROSTAT) 0.4 MG SL tablet Place 1 tablet (0.4 mg total) under the tongue every 5 (five) minutes as needed for chest pain. 25 tablet 1  . oxyCODONE (OXY IR/ROXICODONE) 5 MG immediate release tablet Take 1 tablet (5 mg total) by mouth 2 (two) times daily as needed for severe pain. Must last 30 days. 60 tablet 0  . [START ON 03/25/2021] oxyCODONE (OXY IR/ROXICODONE) 5 MG immediate release tablet Take 1 tablet (5 mg total) by mouth 2 (two) times daily as needed for severe pain. Must last 30 days. 60 tablet 0  . [START ON 04/24/2021] oxyCODONE (OXY IR/ROXICODONE) 5 MG immediate release tablet Take 1 tablet (5 mg total) by mouth 2 (two) times daily as needed for severe pain. Must last 30 days. 60 tablet 0  . [START ON 05/03/2021] pregabalin (LYRICA) 225 MG capsule Take 1 capsule (225 mg total) by mouth 2 (two) times daily. 180 capsule 1  . REPATHA SURECLICK 035 MG/ML SOAJ INJECT 1 PEN INTO THE SKIN EVERY 14 (FOURTEEN) DAYS. 2 mL 3  . rosuvastatin (CRESTOR) 20 MG tablet Take 1 tablet (20 mg total) by mouth daily. 30 tablet 11  .  spironolactone (ALDACTONE) 25 MG tablet Take 1 tablet (25 mg total) by mouth daily.  90 tablet 0  . tobramycin-dexamethasone (TOBRADEX) ophthalmic solution Place 2 drops into the left eye every 4 (four) hours while awake. (Patient not taking: Reported on 03/20/2021) 5 mL 0  . torsemide (DEMADEX) 20 MG tablet Take 1 tablet (20 mg total) by mouth 2 (two) times daily. 30 tablet 5   No current facility-administered medications on file prior to visit.    Allergies  Allergen Reactions  . Chantix [Varenicline Tartrate] Other (See Comments)    Feels "crazy" and angry     Family History  Adopted: Yes  Problem Relation Age of Onset  . Diabetes Mother   . Diabetes Father   . Alcohol abuse Father   . Heart disease Father   . Drug abuse Father   . Stroke Sister   . Anxiety disorder Sister     BP 136/80 (BP Location: Right Arm, Patient Position: Sitting, Cuff Size: Normal)   Pulse 99   Ht _0  (1.702 m)   Wt 198 lb 9.6 oz (90.1 kg)   SpO2 96%   BMI 31.11 kg/m   Review of Systems Denies n/v.      Objective:   Physical Exam VITAL SIGNS:  See vs page GENERAL: no distress Pulses: dorsalis pedis intact bilat.   MSK: no deformity of the feet CV: no leg edema Skin:  no ulcer on the feet.  normal color and temp on the feet. Neuro: sensation is intact to touch on the feet, but decreased from normal.   Ext: there is bilateral onychomycosis of the toenails.     Lab Results  Component Value Date   HGBA1C 11.5 (A) 03/14/2021       Assessment & Plan:  Insulin-requiring type 2 DM, with PAD: uncontrolled. Nausea: we discussed.  She wants to try Trulicity, despite nausea  Patient Instructions  Your blood pressure and heart rate are high today.  Please see your primary care provider soon, to have these rechecked.   I have sent 2 prescriptions to your pharmacy: to change to basaglar, and to add Trulicity. If you get more nausea, we have to abandon the Trulicity.   Please continue the same other diabetes medications Please come back for a follow-up appointment in 2  months.  check your blood sugar twice a day.  vary the time of day when you check, between before the 3 meals, and at bedtime.  also check if you have symptoms of your blood sugar being too high or too low.  please keep a record of the readings and bring it to your next appointment here (or you can bring the meter itself).  You can write it on any piece of paper.  please call us sooner if your blood sugar goes below 70, or if you have a lot of readings over 200.

## 2021-03-22 ENCOUNTER — Other Ambulatory Visit: Payer: Self-pay

## 2021-03-22 MED ORDER — ONDANSETRON HCL 4 MG PO TABS: 4.0000 mg | ORAL_TABLET | Freq: Every day | ORAL | 11 refills | Status: DC | PRN

## 2021-03-23 ENCOUNTER — Ambulatory Visit: Payer: Self-pay | Admitting: Dietician

## 2021-03-31 ENCOUNTER — Ambulatory Visit: Payer: Managed Care, Other (non HMO) | Admitting: Internal Medicine

## 2021-04-05 ENCOUNTER — Ambulatory Visit: Payer: Medicare Other | Admitting: Neurology

## 2021-05-09 DIAGNOSIS — Z79891 Long term (current) use of opiate analgesic: Secondary | ICD-10-CM | POA: Insufficient documentation

## 2021-05-09 NOTE — Progress Notes (Signed)
PROVIDER NOTE: Information contained herein reflects review and annotations entered in association with encounter. Interpretation of such information and data should be left to medically-trained personnel. Information provided to patient can be located elsewhere in the medical record under "Patient Instructions". Document created using STT-dictation technology, any transcriptional errors that may result from process are unintentional.    Patient: Erika [name]  Service Category: E/M  Provider: Gaspar Cola, MD  DOB: [birth year]  DOS: 05/10/2021  Specialty: Interventional Pain Management  MRN: [id number]  Setting: Ambulatory outpatient  PCP: Lesleigh Noe, MD  Type: Established Patient    Referring Provider: Lesleigh Noe, MD  Location: Office  Delivery: Face-to-face     HPI  Erika [name], a 50 y.o. year old female, is here today because of her Chronic hip pain, left [M25.552, G89.29]. Erika [name] primary complain today is Hip Pain (left) and [name] Last encounter: My last encounter with her was on 02/23/2021. Pertinent problems: Erika [name]; Chest pain with high risk for cardiac etiology; Chronic elbow pain (Left); Bilateral hip pain; Chronic ankle pain (Bilateral); Tendinopathy of right gluteus medius; Tendinopathy of gluteus medius (Left); PVD (peripheral vascular disease) ([place]); Chronic pain syndrome; Chronic headaches (1ry area of Pain) (Right); Neurogenic pain; Atypical facial pain (Right); Chronic ear pain (Right); Chronic jaw pain (Right); Geniculate Neuralgia (Right); History of carotid endarterectomy (Right); Right hip pain; Neuropathy of right radial nerve; Radial nerve palsy (Right); Chronic hip pain (Left); Osteoarthritis of hip (Left); Gluteal tendonitis of buttock (Left); Chronic migraine; Neuropathy; Foot drop, left; Bursitis of hip (Left); and Bursitis of hip on their pertinent problem list. Pain Assessment: Severity of Chronic pain is  reported as a 4 /10. Location: Hip Left/denies. Onset: More than a month ago. Quality: Aching,Constant,Sharp. Timing: Constant. Modifying factor(s): medications, heat, ice, topicals. Vitals:  height is _0  (1.702 m) and weight is 198 lb (89.8 kg). Her temporal temperature is 97.9 F (36.6 C). Her blood pressure is 212/88 (abnormal) and her pulse is 80. Her respiration is 16 and oxygen saturation is 99%.   Reason for encounter: medication management.   The patient indicates doing well with the current medication regimen. No adverse reactions or side effects reported to the medications.     RTCB: 08/22/2021 Nonopioids transferred 02/20/2021: Pregabalin (Lyrica)  Pharmacotherapy Assessment   Analgesic: Oxycodone IR 5 mg. 1 tab PO BID (10 mg/day of oxycodone) MME/day: 15 mg/day.   Monitoring: [place] PMP: PDMP reviewed during this encounter.       Pharmacotherapy: No side-effects or adverse reactions reported. Compliance: No problems identified. Effectiveness: Clinically acceptable.  [name], RN  05/10/2021  9:10 AM  Signed Nursing Pain Medication Assessment:  Safety precautions to be maintained throughout the outpatient stay will include: orient to surroundings, keep bed in low position, maintain call bell within reach at all times, provide assistance with transfer out of bed and ambulation.  Medication Inspection Compliance: Pill count conducted under aseptic conditions, in front of the patient. Neither the pills nor the bottle was removed from the patient's sight at any time. Once count was completed pills were immediately returned to the patient in their original bottle.  Medication: Oxycodone IR Pill/Patch Count: 16 of 60 pills remain Pill/Patch Appearance: Markings consistent with prescribed medication Bottle Appearance: Standard pharmacy container. Clearly labeled. Filled Date: 05 / 03 / 2022 Last Medication intake:  Yesterday    UDS:  Summary  Date Value Ref Range Status   06/23/2020 Note  Corrected    Comment:    ==================================================================== [employer] Select 13 (MW) ==================================================================== Test                             Result       Flag       Units  Drug Present and Declared for Prescription Verification   Clonazepam                     28           EXPECTED   ng/mg creat   7-aminoclonazepam              303          EXPECTED   ng/mg creat    Source of clonazepam is a scheduled prescription medication. 7-    aminoclonazepam is an expected metabolite of clonazepam.  Drug Absent but Declared for Prescription Verification   Oxycodone                      Not Detected UNEXPECTED ng/mg creat ==================================================================== Test                      Result    Flag   Units      Ref Range   Creatinine              105              mg/dL      >=20 ==================================================================== Declared Medications:  The flagging and interpretation on this report are based on the  following declared medications.  Unexpected results may arise from  inaccuracies in the declared medications.   **Note: The testing scope of this panel includes these medications:   Clonazepam (Klonopin)  Oxycodone (Roxicodone)   **Note: The testing scope of this panel does not include the  following reported medications:   Albuterol (Ventolin HFA)  Amlodipine (Norvasc)  Amoxicillin (Amoxil)  Aspirin  Benztropine (Cogentin)  Carvedilol (Coreg)  Chlorpromazine  Clonidine (Catapres)  Clopidogrel (Plavix)  Dapagliflozin (Farxiga)  Evolocumab  Fluoxetine  Insulin (Toujeo)  Isosorbide (Imdur)  Lamotrigine (Lamictal)  Losartan (Cozaar)  Mouthwash  Naloxone (Narcan)  Nicotine  Nitroglycerin (Nitrostat)  Ondansetron (Zofran)  Pregabalin (Lyrica)  Spironolactone (Aldactone)  Vitamin  D3 ==================================================================== For clinical consultation, please call [phone]. ====================================================================      ROS  Constitutional: Denies any fever or chills Gastrointestinal: No reported hemesis, hematochezia, vomiting, or acute GI distress Musculoskeletal: Denies any acute onset joint swelling, redness, loss of ROM, or weakness Neurological: No reported episodes of acute onset apraxia, aphasia, dysarthria, agnosia, amnesia, paralysis, loss of coordination, or loss of consciousness  Medication Review  Basaglar KwikPen, Dulaglutide, Evolocumab, FLUoxetine HCl, Insulin Syringe-Needle U-100, albuterol, amLODipine, benztropine, carvedilol, chlorproMAZINE, cloNIDine, clonazePAM, clopidogrel, dapagliflozin propanediol, insulin lispro, isosorbide mononitrate, lamoTRIgine, losartan, naloxone, nicotine, nitroGLYCERIN, ondansetron, oxyCODONE, pregabalin, rosuvastatin, spironolactone, and torsemide  History Review  Allergy: Erika [name] is allergic to chantix [varenicline tartrate]. Drug: Erika [name]  reports no history of drug use. Alcohol:  reports previous alcohol use. Tobacco:  reports that she has been smoking cigarettes. She has a 13.50 pack-year smoking history. She has never used smokeless tobacco. Social: Erika [name]  reports that she has been smoking cigarettes. She has a 13.50 pack-year smoking history. She has never used smokeless tobacco. She reports previous alcohol use. She reports that she does not use drugs. Medical:  has a past medical history of Arthralgia of temporomandibular joint, CAD, multiple vessel, Carotid arterial disease ([place]), Clotting disorder ([place]), Depression, Diastolic dysfunction, Fatty liver disease, nonalcoholic (2025), History of blood transfusion, HLD (hyperlipidemia), Labile hypertension, Myocardial infarction ([place]) (2017), Obesity, PAD (peripheral artery disease) ([place]),  PTSD (post-traumatic stress disorder), Tobacco abuse, and Type 2 diabetes mellitus [facility]) (January 2017). Surgical: Erika [name]  has a past surgical history that includes Cholecystectomy; Tonsillectomy; Cesarean section; Cardiac catheterization ([name], 04/18/2016); Cardiac catheterization ([name], 06/29/2016); Coronary artery bypass graft ([name], 07/06/2016); TEE without cardioversion ([name], 07/06/2016); Endarterectomy (Right, 07/06/2016); Cardiac catheterization ([name], 08/29/2016); ABDOMINAL AORTOGRAM W/LOWER EXTREMITY ([name], 10/15/2018); PERIPHERAL VASCULAR INTERVENTION (Left, 10/15/2018); ABDOMINAL AORTOGRAM W/LOWER EXTREMITY (Bilateral, 08/19/2019); Endarterectomy (Right, 04/27/2020); and LEFT HEART CATH AND CORS/GRAFTS ANGIOGRAPHY ([name], 08/24/2020). Family: family history includes Alcohol abuse in her father; Anxiety disorder in her sister; Diabetes in her father and mother; Drug abuse in her father; Heart disease in her father; Stroke in her sister. She was adopted.  Laboratory Chemistry Profile   Renal Lab Results  Component Value Date   BUN 7 01/24/2021   CREATININE 0.77 01/24/2021   LABCREA 54.6 02/23/2021   BCR 18 01/12/2021   GFR 105.97 11/21/2020   GFRAA 106 01/12/2021   GFRNONAA >60 01/24/2021   LABVMA 1.3 02/23/2021   LABVMA 2.4 02/23/2021     Hepatic Lab Results  Component Value Date   AST 16 11/21/2020   ALT 36 (H) 11/21/2020   ALBUMIN 4.1 11/21/2020   ALKPHOS 96 11/21/2020   HCVAB <0.1 09/15/2019   LIPASE 34 05/28/2009     Electrolytes Lab Results  Component Value Date   NA 136 01/24/2021   K 4.4 01/24/2021   CL 96 (L) 01/24/2021   CALCIUM 10.1 01/24/2021   MG 2.0 01/07/2019   PHOS 3.1 05/19/2017     Bone Lab Results  Component Value Date   VD25OH 14.27 (L) 01/18/2020   25OHVITD1 16 (L) 01/07/2019   25OHVITD2 <1.0 01/07/2019   25OHVITD3 15 01/07/2019     Inflammation (CRP: Acute Phase) (ESR: Chronic Phase) Lab Results  Component Value Date   CRP 15 (H) 01/07/2019    ESRSEDRATE 17 03/17/2019   LATICACIDVEN 2.3 ([place]) 05/19/2017       Note: Above Lab results reviewed.  Recent Imaging Review  VAS US CAROTID Carotid Arterial Duplex Study  Indications:      Carotid artery disease and 04/27/20: Redo right CEA. Comparison Study: Pre op 03/21/20: Right >80% Left: 40-59% (116/49 cm/s) Vert                   absent  Performing Technologist: Ralene Cork RVT  Supporting Technologist: Leavy Cella RDCS    Examination Guidelines: A complete evaluation includes B-mode imaging, spectral Doppler, color Doppler, and power Doppler as needed of all accessible portions of each vessel. Bilateral testing is considered an integral part of a complete examination. Limited examinations for reoccurring indications may be performed as noted.    Right Carotid Findings: +----------+--------+--------+--------+------------------+--------+           PSV cm/sEDV cm/sStenosisPlaque DescriptionComments +----------+--------+--------+--------+------------------+--------+ CCA Prox  86      39                                         +----------+--------+--------+--------+------------------+--------+ CCA Mid   45      19  heterogenous               +----------+--------+--------+--------+------------------+--------+ CCA Distal44      19              heterogenous               +----------+--------+--------+--------+------------------+--------+ ICA Prox  116     44      40-59%                             +----------+--------+--------+--------+------------------+--------+ ICA Mid   119     55      40-59%                             +----------+--------+--------+--------+------------------+--------+ ICA Distal138     62      40-59%                             +----------+--------+--------+--------+------------------+--------+ ECA       96      27                                          +----------+--------+--------+--------+------------------+--------+  +----------+--------+-------+----------------+-------------------+           PSV cm/sEDV cmsDescribe        Arm Pressure (mmHG) +----------+--------+-------+----------------+-------------------+ [id number]     17     Multiphasic, WNL                    +----------+--------+-------+----------------+-------------------+  +---------+--------+--+--------+--+---------+ VertebralPSV cm/s94EDV cm/s31Antegrade +---------+--------+--+--------+--+---------+     Left Carotid Findings: +----------+--------+--------+--------+------------------+--------+           PSV cm/sEDV cm/sStenosisPlaque DescriptionComments +----------+--------+--------+--------+------------------+--------+ CCA Prox  139     24                                         +----------+--------+--------+--------+------------------+--------+ CCA Mid   105     27                                         +----------+--------+--------+--------+------------------+--------+ CCA Distal102     31              heterogenous               +----------+--------+--------+--------+------------------+--------+ ICA Prox  85      28      1-39%   heterogenous               +----------+--------+--------+--------+------------------+--------+ ICA Mid   111     45                                         +----------+--------+--------+--------+------------------+--------+ ICA Distal97      45                                         +----------+--------+--------+--------+------------------+--------+ ECA  159     30                                         +----------+--------+--------+--------+------------------+--------+  +----------+--------+--------+----------------+-------------------+           PSV cm/sEDV cm/sDescribe        Arm Pressure  (mmHG) +----------+--------+--------+----------------+-------------------+ [id number]             Multiphasic, WNL                    +----------+--------+--------+----------------+-------------------+  +---------+--------+--+--------+--+---------+ VertebralPSV cm/s77EDV cm/s23Antegrade +---------+--------+--+--------+--+---------+       Summary: Right Carotid: Velocities in the right ICA are consistent with a 40-59%                stenosis. No significant plaque noted.  Left Carotid: Velocities in the left ICA are consistent with a 1-39% stenosis.  Vertebrals:  Bilateral vertebral arteries demonstrate antegrade flow. Subclavians: Normal flow hemodynamics were seen in bilateral subclavian              arteries.  *See table(s) above for measurements and observations.    Electronically signed by Curt Jews MD on 03/20/2021 at 3:48:42 PM.      Final   VAS [name] ABI WITH/WO TBI LOWER EXTREMITY DOPPLER STUDY  Indications: Peripheral artery disease.   Vascular Interventions: Right mid-distal SFA stent placed with PTA on                         10/15/2018. Angiogram on 08/19/2019 showed patent left                         mid to distal SFA stent with occlusion just distal to                         stent. The peroneal and ATA are diffusely diseased with                         ATA distal occlusion.  Performing Technologist: Leavy Cella RDCS  Supporting Technologist: Ralene Cork RVT    Examination Guidelines: A complete evaluation includes at minimum, Doppler waveform signals and systolic blood pressure reading at the level of bilateral brachial, anterior tibial, and posterior tibial arteries, when vessel segments are accessible. Bilateral testing is considered an integral part of a complete examination. Photoelectric Plethysmograph (PPG) waveforms and toe systolic pressure readings are included as required and additional duplex testing as needed. Limited  examinations for reoccurring indications may be performed as noted.    ABI Findings: +---------+------------------+-----+----------+--------+ Right    Rt Pressure (mmHg)IndexWaveform  Comment  +---------+------------------+-----+----------+--------+ Brachial 183                                       +---------+------------------+-----+----------+--------+ PTA      120               0.61 monophasic         +---------+------------------+-----+----------+--------+ DP       105               0.54 monophasic         +---------+------------------+-----+----------+--------+ Gardiner Rhyme  0.60                    +---------+------------------+-----+----------+--------+  +---------+------------------+-----+----------+-------+ Left     Lt Pressure (mmHg)IndexWaveform  Comment +---------+------------------+-----+----------+-------+ Brachial 196                                      +---------+------------------+-----+----------+-------+ PTA      91                0.46 monophasic        +---------+------------------+-----+----------+-------+ DP       87                0.44 monophasic        +---------+------------------+-----+----------+-------+ Great Toe90                0.46                   +---------+------------------+-----+----------+-------+  +-------+-----------+-----------+------------+------------+ ABI/TBIToday's ABIToday's TBIPrevious ABIPrevious TBI +-------+-----------+-----------+------------+------------+ Right  0.61       0.6        0.51        0.45         +-------+-----------+-----------+------------+------------+ Left   0.46       0.46       0.49        0.39         +-------+-----------+-----------+------------+------------+  Previous ABI on 07/21/20.   Summary: Right: Resting right ankle-brachial index indicates moderate right lower extremity arterial disease. The right  toe-brachial index is abnormal. RT great toe pressure = 118 mmHg.  Left: Resting left ankle-brachial index indicates severe left lower extremity arterial disease. The left toe-brachial index is abnormal. LT Great toe pressure = 90 mmHg.    *See table(s) above for measurements and observations.    Electronically signed by Curt Jews MD on 03/20/2021 at 3:48:33 PM.       Final   Note: Reviewed        Physical Exam  General appearance: Well nourished, well developed, and well hydrated. In no apparent acute distress Mental status: Alert, oriented x 3 (person, place, & time)       Respiratory: No evidence of acute respiratory distress Eyes: PERLA Vitals: BP (!) 212/88 (BP Location: Right Arm, Patient Position: Sitting, Cuff Size: Normal)   Pulse 80   Temp 97.9 F (36.6 C) (Temporal)   Resp 16   Ht _0  (1.702 m)   Wt 198 lb (89.8 kg)   SpO2 99%   BMI 31.01 kg/m  BMI: Estimated body mass index is 31.01 kg/m as calculated from the following:   Height as of this encounter: _1  (1.702 m).   Weight as of this encounter: 198 lb (89.8 kg). Ideal: Ideal body weight: 61.6 kg (135 lb 12.9 oz) Adjusted ideal body weight: 72.9 kg (160 lb 10.9 oz)  Assessment   Status Diagnosis  Controlled Controlled Controlled 1. Chronic hip pain (Left)   2. Trochanteric bursitis of left hip   3. Chronic elbow pain (Left)   4. Tendinopathy of gluteus medius (Left)   5. Chronic pain syndrome   6. Pharmacologic therapy   7. Chronic use of opiate for therapeutic purpose   8. Uncomplicated opioid dependence ([place])   9. Chronic anticoagulation (PLAVIX)      Updated Problems: Problem  Bursitis of Hip  Tendinopathy of gluteus medius (Left)  Chronic elbow  pain (Left)   Started with a fall that she suffered on February 2022 when she slipped on a patch of ice and landed on her left elbow.     Plan of Care  Problem-specific:  No problem-specific Assessment & Plan notes found for this  encounter.  Erika [name] has a current medication list which includes the following long-term medication(s): albuterol, benztropine, carvedilol, chlorpromazine, clonazepam, clonidine, fluoxetine hcl, insulin lispro, lamotrigine, rosuvastatin, spironolactone, torsemide, amlodipine, basaglar kwikpen, isosorbide mononitrate, losartan, naloxone, nitroglycerin, [START ON 05/24/2021] oxycodone, [START ON 06/23/2021] oxycodone, [START ON 07/23/2021] oxycodone, and pregabalin.  Pharmacotherapy (Medications Ordered): Meds ordered this encounter  Medications  . oxyCODONE (OXY IR/ROXICODONE) 5 MG immediate release tablet    Sig: Take 1 tablet (5 mg total) by mouth 2 (two) times daily as needed for severe pain. Must last 30 days.    Dispense:  60 tablet    Refill:  0    Not a duplicate. Do NOT delete! Dispense 1 day early if closed on refill date. Avoid benzodiazepines within 8 hours of opioids. Do not send refill requests.  Marland Kitchen oxyCODONE (OXY IR/ROXICODONE) 5 MG immediate release tablet    Sig: Take 1 tablet (5 mg total) by mouth 2 (two) times daily as needed for severe pain. Must last 30 days.    Dispense:  60 tablet    Refill:  0    Not a duplicate. Do NOT delete! Dispense 1 day early if closed on refill date. Avoid benzodiazepines within 8 hours of opioids. Do not send refill requests.  Marland Kitchen oxyCODONE (OXY IR/ROXICODONE) 5 MG immediate release tablet    Sig: Take 1 tablet (5 mg total) by mouth 2 (two) times daily as needed for severe pain. Must last 30 days.    Dispense:  60 tablet    Refill:  0    Not a duplicate. Do NOT delete! Dispense 1 day early if closed on refill date. Avoid benzodiazepines within 8 hours of opioids. Do not send refill requests.  . naloxone (NARCAN) nasal spray 4 mg/0.1 mL    Sig: Place 1 spray into the nose as needed for up to 365 doses (for opioid-induced respiratory depresssion). In case of emergency (overdose), spray once into each nostril. If no response within 3 minutes,  repeat application and call [phone].    Dispense:  1 each    Refill:  0    Instruct patient in proper use of device.   Orders:  Orders Placed This Encounter  Procedures  . HIP INJECTION    Standing Status:   Future    Standing Expiration Date:   08/10/2021    Scheduling Instructions:     Procedure: Hip injection     Side: Left-sided     Sedation: With Sedation.     Timeframe: As soon as schedule allows  . HIP INJECTION    Standing Status:   Future    Standing Expiration Date:   08/10/2021    Scheduling Instructions:     Procedure: Trochanteric bursa injection     Purpose: Therapeutic     Indication: Hip pain 2ry to Trochanteric Burlitis left (M70.62).     Side: Left-sided     Sedation: With Sedation.     Timeframe: As soon as the schedule permits.  . Medium Joint Injection/Arthrocentesis    Level(s): Left elbow injection Laterality: Left Sedation: With Sedation Purpose: Diagnostic Indication(s): Sub-acute pain    Standing Status:   Future    Standing Expiration Date:  08/10/2021    Scheduling Instructions:     Requested Scheduling Timeframe: As allowed by the schedule  . DG ELBOW COMPLETE LEFT (3+VIEW)    Standing Status:   Future    Standing Expiration Date:   06/10/2021    Scheduling Instructions:     Imaging must be done as soon as possible. Inform patient that order will expire within 30 days and I will not renew it.    Order Specific Question:   Reason for Exam (SYMPTOM  OR DIAGNOSIS REQUIRED)    Answer:   Chronic elbow pain secondary to fall (trauma).    Order Specific Question:   Is patient pregnant?    Answer:   No    Order Specific Question:   Preferred imaging location?    Answer:   [facility]    Order Specific Question:   Call Results- Best Contact Number?    Answer:   [phone] ([facility])    Order Specific Question:   Radiology Contrast Protocol - do NOT remove file path    Answer:   [email or web address]     Order Specific Question:   Release to patient    Answer:   Immediate  . Blood Thinner Instructions to Nursing    Always make sure patient has clearance from prescribing physician to stop blood thinners for interventional therapies. If the patient requires a Lovenox-bridge therapy, make sure arrangements are made to institute it with the assistance of the PCP.    Scheduling Instructions:     Have Erika [name] stop the Plavix (Clopidogrel) x 7-10 days prior to procedure or surgery.   Follow-up plan:   Return for Procedure (w/ sedation): (L) IA Hip/TB Inj + (L) Elbow inj., (Blood Thinner Protocol).      Interventional Therapies  Risk  Complexity Considerations:   Estimated body mass index is 31.01 kg/m as calculated from the following:   Height as of this encounter: $RemoveBeforeD'5\' 7"'[id number]$  (1.702 m).   Weight as of this encounter: 198 lb (89.8 kg). NOTE: PLAVIX ANTICOAGULATION(Stop:7-10 days pre-procedure; Restart:2 hours post-proc.)   Planned  Pending:   Therapeutic left IA hip joint injection #3  Therapeutic left peri-insertional gluteus medius tendon injection #4  Diagnostic/therapeutic left elbow injection #1  Diagnostic/therapeutic left trochanteric bursa injection #1    Under consideration:   Diagnostic/therapeutic left elbow injection #1  Diagnostic right facial NB Diagnostic right geniculate NB  Diagnostic right occipital NB  Diagnostic Botox injections   Completed:   Therapeutic left peri-insertional gluteus medius tendon injection x3 (02/23/2021) (100/100/100/100) Therapeutic left IA hip joint injection x2 (02/23/2021) (100/100/100/100) Therapeutic/palliative Facial (CN VII)/GeniculateNB x3 (03/05/2019) (100/100/0/0)   Therapeutic  Palliative (PRN) options:   Therapeutic left peri-insertional gluteus medius tendon injection #4   Therapeutic left IA hip joint injection #3  Therapeutic/palliative Facial (CN VII) NB #4     Recent Visits Date Type Provider Dept  03/09/21  Telemedicine Milinda Pointer, MD [facility]  02/23/21 Procedure visit Milinda Pointer, MD [facility]  02/20/21 Office Visit Milinda Pointer, MD [facility]  Showing recent visits within past 90 days and meeting all other requirements Today's Visits Date Type Provider Dept  05/10/21 Office Visit Milinda Pointer, MD [facility]  Showing today's visits and meeting all other requirements Future Appointments Date Type Provider Dept  05/25/21 Appointment Milinda Pointer, MD [facility]  Showing future appointments within next 90 days and meeting all other requirements  I discussed the assessment and treatment plan with  the patient. The patient was provided an opportunity to ask questions and all were answered. The patient agreed with the plan and demonstrated an understanding of the instructions.  Patient advised to call back or seek an in-person evaluation if the symptoms or condition worsens.  Duration of encounter: 30 minutes.  Note by: Gaspar Cola, MD Date: 05/10/2021; Time: 10:27 AM

## 2021-05-10 ENCOUNTER — Ambulatory Visit: Payer: Managed Care, Other (non HMO) | Attending: Pain Medicine | Admitting: Pain Medicine

## 2021-05-10 ENCOUNTER — Encounter: Payer: Self-pay | Admitting: Pain Medicine

## 2021-05-10 ENCOUNTER — Other Ambulatory Visit: Payer: Self-pay

## 2021-05-10 VITALS — BP 212/88 | HR 80 | Temp 97.9°F | Resp 16 | Ht 67.0 in | Wt 198.0 lb

## 2021-05-10 DIAGNOSIS — M67952 Unspecified disorder of synovium and tendon, left thigh: Secondary | ICD-10-CM | POA: Insufficient documentation

## 2021-05-10 DIAGNOSIS — F1120 Opioid dependence, uncomplicated: Secondary | ICD-10-CM | POA: Insufficient documentation

## 2021-05-10 DIAGNOSIS — M25522 Pain in left elbow: Secondary | ICD-10-CM | POA: Diagnosis not present

## 2021-05-10 DIAGNOSIS — G8929 Other chronic pain: Secondary | ICD-10-CM | POA: Diagnosis present

## 2021-05-10 DIAGNOSIS — B0221 Postherpetic geniculate ganglionitis: Secondary | ICD-10-CM

## 2021-05-10 DIAGNOSIS — Z79899 Other long term (current) drug therapy: Secondary | ICD-10-CM | POA: Insufficient documentation

## 2021-05-10 DIAGNOSIS — Z7901 Long term (current) use of anticoagulants: Secondary | ICD-10-CM | POA: Insufficient documentation

## 2021-05-10 DIAGNOSIS — Z79891 Long term (current) use of opiate analgesic: Secondary | ICD-10-CM

## 2021-05-10 DIAGNOSIS — M7062 Trochanteric bursitis, left hip: Secondary | ICD-10-CM | POA: Insufficient documentation

## 2021-05-10 DIAGNOSIS — M25552 Pain in left hip: Secondary | ICD-10-CM | POA: Diagnosis present

## 2021-05-10 DIAGNOSIS — G894 Chronic pain syndrome: Secondary | ICD-10-CM | POA: Diagnosis present

## 2021-05-10 MED ORDER — OXYCODONE HCL 5 MG PO TABS: 5.0000 mg | ORAL_TABLET | Freq: Two times a day (BID) | ORAL | 0 refills | Status: DC | PRN

## 2021-05-10 MED ORDER — NALOXONE HCL 4 MG/0.1ML NA LIQD: 1.0000 | NASAL | 0 refills | Status: DC | PRN

## 2021-05-10 NOTE — Patient Instructions (Addendum)
____________________________________________________________________________________________  Preparing for Procedure with Sedation  Procedure appointments are limited to planned procedures: . No Prescription Refills. . No disability issues will be discussed. . No medication changes will be discussed.  Instructions: . Oral Intake: Do not eat or drink anything for at least 8 hours prior to your procedure. (Exception: Blood Pressure Medication. See below.) . Transportation: Unless otherwise stated by your physician, you may drive yourself after the procedure. . Blood Pressure Medicine: Do not forget to take your blood pressure medicine with a sip of water the morning of the procedure. If your Diastolic (lower reading)is above 100 mmHg, elective cases will be cancelled/rescheduled. . Blood thinners: These will need to be stopped for procedures. Notify our staff if you are taking any blood thinners. Depending on which one you take, there will be specific instructions on how and when to stop it. . Diabetics on insulin: Notify the staff so that you can be scheduled 1st case in the morning. If your diabetes requires high dose insulin, take only  of your normal insulin dose the morning of the procedure and notify the staff that you have done so. . Preventing infections: Shower with an antibacterial soap the morning of your procedure. . Build-up your immune system: Take 1000 mg of Vitamin C with every meal (3 times a day) the day prior to your procedure. . Antibiotics: Inform the staff if you have a condition or reason that requires you to take antibiotics before dental procedures. . Pregnancy: If you are pregnant, call and cancel the procedure. . Sickness: If you have a cold, fever, or any active infections, call and cancel the procedure. . Arrival: You must be in the facility at least 30 minutes prior to your scheduled procedure. . Children: Do not bring children with you. . Dress appropriately:  Bring dark clothing that you would not mind if they get stained. . Valuables: Do not bring any jewelry or valuables.  Reasons to call and reschedule or cancel your procedure: (Following these recommendations will minimize the risk of a serious complication.) . Surgeries: Avoid having procedures within 2 weeks of any surgery. (Avoid for 2 weeks before or after any surgery). . Flu Shots: Avoid having procedures within 2 weeks of a flu shots or . (Avoid for 2 weeks before or after immunizations). . Barium: Avoid having a procedure within 7-10 days after having had a radiological study involving the use of radiological contrast. (Myelograms, Barium swallow or enema study). . Heart attacks: Avoid any elective procedures or surgeries for the initial 6 months after a "Myocardial Infarction" (Heart Attack). . Blood thinners: It is imperative that you stop these medications before procedures. Let us know if you if you take any blood thinner.  . Infection: Avoid procedures during or within two weeks of an infection (including chest colds or gastrointestinal problems). Symptoms associated with infections include: Localized redness, fever, chills, night sweats or profuse sweating, burning sensation when voiding, cough, congestion, stuffiness, runny nose, sore throat, diarrhea, nausea, vomiting, cold or Flu symptoms, recent or current infections. It is specially important if the infection is over the area that we intend to treat. . Heart and lung problems: Symptoms that may suggest an active cardiopulmonary problem include: cough, chest pain, breathing difficulties or shortness of breath, dizziness, ankle swelling, uncontrolled high or unusually low blood pressure, and/or palpitations. If you are experiencing any of these symptoms, cancel your procedure and contact your primary care physician for an evaluation.  Remember:  Regular Business hours are:    Monday to Thursday 8:00 AM to 4:00 PM  Provider's  Schedule: Jaben Benegas, MD:  Procedure days: Tuesday and Thursday 7:30 AM to 4:00 PM  Bilal Lateef, MD:  Procedure days: Monday and Wednesday 7:30 AM to 4:00 PM ____________________________________________________________________________________________   ____________________________________________________________________________________________  General Risks and Possible Complications  Patient Responsibilities: It is important that you read this as it is part of your informed consent. It is our duty to inform you of the risks and possible complications associated with treatments offered to you. It is your responsibility as a patient to read this and to ask questions about anything that is not clear or that you believe was not covered in this document.  Patient's Rights: You have the right to refuse treatment. You also have the right to change your mind, even after initially having agreed to have the treatment done. However, under this last option, if you wait until the last second to change your mind, you may be charged for the materials used up to that point.  Introduction: Medicine is not an exact science. Everything in Medicine, including the lack of treatment(s), carries the potential for danger, harm, or loss (which is by definition: Risk). In Medicine, a complication is a secondary problem, condition, or disease that can aggravate an already existing one. All treatments carry the risk of possible complications. The fact that a side effects or complications occurs, does not imply that the treatment was conducted incorrectly. It must be clearly understood that these can happen even when everything is done following the highest safety standards.  No treatment: You can choose not to proceed with the proposed treatment alternative. The "PRO(s)" would include: avoiding the risk of complications associated with the therapy. The "CON(s)" would include: not getting any of the treatment  benefits. These benefits fall under one of three categories: diagnostic; therapeutic; and/or palliative. Diagnostic benefits include: getting information which can ultimately lead to improvement of the disease or symptom(s). Therapeutic benefits are those associated with the successful treatment of the disease. Finally, palliative benefits are those related to the decrease of the primary symptoms, without necessarily curing the condition (example: decreasing the pain from a flare-up of a chronic condition, such as incurable terminal cancer).  General Risks and Complications: These are associated to most interventional treatments. They can occur alone, or in combination. They fall under one of the following six (6) categories: no benefit or worsening of symptoms; bleeding; infection; nerve damage; allergic reactions; and/or death. 1. No benefits or worsening of symptoms: In Medicine there are no guarantees, only probabilities. No healthcare provider can ever guarantee that a medical treatment will work, they can only state the probability that it may. Furthermore, there is always the possibility that the condition may worsen, either directly, or indirectly, as a consequence of the treatment. 2. Bleeding: This is more common if the patient is taking a blood thinner, either prescription or over the counter (example: Goody Powders, Fish oil, Aspirin, Garlic, etc.), or if suffering a condition associated with impaired coagulation (example: Hemophilia, cirrhosis of the liver, low platelet counts, etc.). However, even if you do not have one on these, it can still happen. If you have any of these conditions, or take one of these drugs, make sure to notify your treating physician. 3. Infection: This is more common in patients with a compromised immune system, either due to disease (example: diabetes, cancer, human immunodeficiency virus [HIV], etc.), or due to medications or treatments (example: therapies used to treat  cancer and   rheumatological diseases). However, even if you do not have one on these, it can still happen. If you have any of these conditions, or take one of these drugs, make sure to notify your treating physician. 4. Nerve Damage: This is more common when the treatment is an invasive one, but it can also happen with the use of medications, such as those used in the treatment of cancer. The damage can occur to small secondary nerves, or to large primary ones, such as those in the spinal cord and brain. This damage may be temporary or permanent and it may lead to impairments that can range from temporary numbness to permanent paralysis and/or brain death. 5. Allergic Reactions: Any time a substance or material comes in contact with our body, there is the possibility of an allergic reaction. These can range from a mild skin rash (contact dermatitis) to a severe systemic reaction (anaphylactic reaction), which can result in death. 6. Death: In general, any medical intervention can result in death, most of the time due to an unforeseen complication. ____________________________________________________________________________________________  ____________________________________________________________________________________________  Blood Thinners  IMPORTANT NOTICE:  If you take any of these, make sure to notify the nursing staff.  Failure to do so may result in injury.  Recommended time intervals to stop and restart blood-thinners, before & after invasive procedures  Generic Name Brand Name Stop Time. Must be stopped at least this long before procedures. After procedures, wait at least this long before re-starting.  Abciximab Reopro 15 days 2 hrs  Alteplase Activase 10 days 10 days  Anagrelide Agrylin    Apixaban Eliquis 3 days 6 hrs  Cilostazol Pletal 3 days 5 hrs  Clopidogrel Plavix 7-10 days 2 hrs  Dabigatran Pradaxa 5 days 6 hrs  Dalteparin Fragmin 24 hours 4 hrs  Dipyridamole Aggrenox  11days 2 hrs  Edoxaban Lixiana; Savaysa 3 days 2 hrs  Enoxaparin  Lovenox 24 hours 4 hrs  Eptifibatide Integrillin 8 hours 2 hrs  Fondaparinux  Arixtra 72 hours 12 hrs  Hydroxychloroquine Plaquenil 11 days   Prasugrel Effient 7-10 days 6 hrs  Reteplase Retavase 10 days 10 days  Rivaroxaban Xarelto 3 days 6 hrs  Ticagrelor Brilinta 5-7 days 6 hrs  Ticlopidine Ticlid 10-14 days 2 hrs  Tinzaparin Innohep 24 hours 4 hrs  Tirofiban Aggrastat 8 hours 2 hrs  Warfarin Coumadin 5 days 2 hrs   Other medications with blood-thinning effects  Product indications Generic (Brand) names Note  Cholesterol Lipitor Stop 4 days before procedure  Blood thinner (injectable) Heparin (LMW or LMWH Heparin) Stop 24 hours before procedure  Cancer Ibrutinib (Imbruvica) Stop 7 days before procedure  Malaria/Rheumatoid Hydroxychloroquine (Plaquenil) Stop 11 days before procedure  Thrombolytics  10 days before or after procedures   Over-the-counter (OTC) Products with blood-thinning effects  Product Common names Stop Time  Aspirin > 325 mg Goody Powders, Excedrin, etc. 11 days  Aspirin ? 81 mg  7 days  Fish oil  4 days  Garlic supplements  7 days  Ginkgo biloba  36 hours  Ginseng  24 hours  NSAIDs Ibuprofen, Naprosyn, etc. 3 days  Vitamin E  4 days   ____________________________________________________________________________________________  ____________________________________________________________________________________________  Drug Holidays (Slow)  What is a "Drug Holiday"? Drug Holiday: is the name given to the period of time during which a patient stops taking a medication(s) for the purpose of eliminating tolerance to the drug.  Benefits . Improved effectiveness of opioids. . Decreased opioid dose needed to achieve benefits. . Improved pain with  lesser dose.  What is tolerance? Tolerance: is the progressive decreased in effectiveness of a drug due to its repetitive use. With repetitive  use, the body gets use to the medication and as a consequence, it loses its effectiveness. This is a common problem seen with opioid pain medications. As a result, a larger dose of the drug is needed to achieve the same effect that used to be obtained with a smaller dose.  How long should a "Drug Holiday" last? You should stay off of the pain medicine for at least 14 consecutive days. (2 weeks)  Should I stop the medicine "cold [place]"? No. You should always coordinate with your Pain Specialist so that he/she can provide you with the correct medication dose to make the transition as smoothly as possible.  How do I stop the medicine? Slowly. You will be instructed to decrease the daily amount of pills that you take by one (1) pill every seven (7) days. This is called a "slow downward taper" of your dose. For example: if you normally take four (4) pills per day, you will be asked to drop this dose to three (3) pills per day for seven (7) days, then to two (2) pills per day for seven (7) days, then to one (1) per day for seven (7) days, and at the end of those last seven (7) days, this is when the "Drug Holiday" would start.   Will I have withdrawals? By doing a "slow downward taper" like this one, it is unlikely that you will experience any significant withdrawal symptoms. Typically, what triggers withdrawals is the sudden stop of a high dose opioid therapy. Withdrawals can usually be avoided by slowly decreasing the dose over a prolonged period of time. If you do not follow these instructions and decide to stop your medication abruptly, withdrawals may be possible.  What are withdrawals? Withdrawals: refers to the wide range of symptoms that occur after stopping or dramatically reducing opiate drugs after heavy and prolonged use. Withdrawal symptoms do not occur to patients that use low dose opioids, or those who take the medication sporadically. Contrary to benzodiazepine (example: Valium, Xanax,  etc.) or alcohol withdrawals ("Delirium Tremens"), opioid withdrawals are not lethal. Withdrawals are the physical manifestation of the body getting rid of the excess receptors.  Expected Symptoms Early symptoms of withdrawal may include: . Agitation . Anxiety . Muscle aches . Increased tearing . Insomnia . Runny nose . Sweating . Yawning  Late symptoms of withdrawal may include: . Abdominal cramping . Diarrhea . Dilated pupils . Goose bumps . Nausea . Vomiting  Will I experience withdrawals? Due to the slow nature of the taper, it is very unlikely that you will experience any.  What is a slow taper? Taper: refers to the gradual decrease in dose.  (Last update: 07/13/2020) ____________________________________________________________________________________________    ____________________________________________________________________________________________  Medication Recommendations and Reminders  Applies to: All patients receiving prescriptions (written and/or electronic).  Medication Rules & Regulations: These rules and regulations exist for your safety and that of others. They are not flexible and neither are we. Dismissing or ignoring them will be considered "non-compliance" with medication therapy, resulting in complete and irreversible termination of such therapy. (See document titled "Medication Rules" for more details.) In all conscience, because of safety reasons, we cannot continue providing a therapy where the patient does not follow instructions.  Pharmacy of record:   Definition: This is the pharmacy where your electronic prescriptions will be sent.   We do not  endorse any particular pharmacy, however, we have experienced problems with [facility] not securing enough medication supply for the community.  We do not restrict you in your choice of pharmacy. However, once we write for your prescriptions, we will NOT be re-sending more prescriptions to fix  restricted supply problems created by your pharmacy, or your insurance.   The pharmacy listed in the electronic medical record should be the one where you want electronic prescriptions to be sent.  If you choose to change pharmacy, simply notify our nursing staff.  Recommendations:  Keep all of your pain medications in a safe place, under lock and key, even if you live alone. We will NOT replace lost, stolen, or damaged medication.  After you fill your prescription, take 1 week's worth of pills and put them away in a safe place. You should keep a separate, properly labeled bottle for this purpose. The remainder should be kept in the original bottle. Use this as your primary supply, until it runs out. Once it's gone, then you know that you have 1 week's worth of medicine, and it is time to come in for a prescription refill. If you do this correctly, it is unlikely that you will ever run out of medicine.  To make sure that the above recommendation works, it is very important that you make sure your medication refill appointments are scheduled at least 1 week before you run out of medicine. To do this in an effective manner, make sure that you do not leave the office without scheduling your next medication management appointment. Always ask the nursing staff to show you in your prescription , when your medication will be running out. Then arrange for the receptionist to get you a return appointment, at least 7 days before you run out of medicine. Do not wait until you have 1 or 2 pills left, to come in. This is very poor planning and does not take into consideration that we may need to cancel appointments due to bad weather, sickness, or emergencies affecting our staff.  DO NOT ACCEPT A "Partial Fill": If for any reason your pharmacy does not have enough pills/tablets to completely fill or refill your prescription, do not allow for a "partial fill". The law allows the pharmacy to complete that  prescription within 72 hours, without requiring a new prescription. If they do not fill the rest of your prescription within those 72 hours, you will need a separate prescription to fill the remaining amount, which we will NOT provide. If the reason for the partial fill is your insurance, you will need to talk to the pharmacist about payment alternatives for the remaining tablets, but again, DO NOT ACCEPT A PARTIAL FILL, unless you can trust your pharmacist to obtain the remainder of the pills within 72 hours.  Prescription refills and/or changes in medication(s):   Prescription refills, and/or changes in dose or medication, will be conducted only during scheduled medication management appointments. (Applies to both, written and electronic prescriptions.)  No refills on procedure days. No medication will be changed or started on procedure days. No changes, adjustments, and/or refills will be conducted on a procedure day. Doing so will interfere with the diagnostic portion of the procedure.  No phone refills. No medications will be "called into the pharmacy".  No Fax refills.  No weekend refills.  No Holliday refills.  No after hours refills.  Remember:  Business hours are:  Monday to Thursday 8:00 AM to 4:00 PM Provider's Schedule: Milinda Pointer,  MD - Appointments are:  Medication management: Monday and Wednesday 8:00 AM to 4:00 PM Procedure day: Tuesday and Thursday 7:30 AM to 4:00 PM Gillis Santa, MD - Appointments are:  Medication management: Tuesday and Thursday 8:00 AM to 4:00 PM Procedure day: Monday and Wednesday 7:30 AM to 4:00 PM (Last update: 07/13/2020) ____________________________________________________________________________________________   ____________________________________________________________________________________________  CBD (cannabidiol) WARNING  Applicable to: All individuals currently taking or considering taking CBD (cannabidiol) and, more  important, all patients taking opioid analgesic controlled substances (pain medication). (Example: oxycodone; oxymorphone; hydrocodone; hydromorphone; morphine; methadone; tramadol; tapentadol; fentanyl; buprenorphine; butorphanol; dextromethorphan; meperidine; codeine; etc.)  Legal status: CBD remains a Schedule I drug prohibited for any use. CBD is illegal with one exception. In the [place], CBD has a limited Transport planner (FDA) approval for the treatment of two specific types of epilepsy disorders. Only one CBD product has been approved by the FDA for this purpose: "Epidiolex". FDA is aware that some companies are marketing products containing cannabis and cannabis-derived compounds in ways that violate the [employer], Drug and Cosmetic Act [facility] Act) and that may put the health and safety of consumers at risk. The FDA, a Federal agency, has not enforced the CBD status since 2018.   Legality: Some manufacturers ship CBD products nationally, which is illegal. Often such products are sold online and are therefore available throughout the country. CBD is openly sold in head shops and health food stores in some states where such sales have not been explicitly legalized. Selling unapproved products with unsubstantiated therapeutic claims is not only a violation of the law, but also can put patients at risk, as these products have not been proven to be safe or effective. Federal illegality makes it difficult to conduct research on CBD.  Reference: "FDA Regulation of Cannabis and Cannabis-Derived Products, Including Cannabidiol (CBD)" - [employer]  Warning: CBD is not FDA approved and has not undergo the same manufacturing controls as prescription drugs.  This means that the purity and safety of available CBD may be questionable. Most of the time, despite manufacturer's  claims, it is contaminated with THC (delta-9-tetrahydrocannabinol - the chemical in marijuana responsible for the "HIGH").  When this is the case, the [facility] contaminant will trigger a positive urine drug screen (UDS) test for Marijuana (carboxy-THC). Because a positive UDS for any illicit substance is a violation of our medication agreement, your opioid analgesics (pain medicine) may be permanently discontinued.  MORE ABOUT CBD  General Information: CBD  is a derivative of the Marijuana (cannabis sativa) plant discovered in 32. It is one of the 113 identified substances found in Marijuana. It accounts for up to 40% of the plant's extract. As of 2018, preliminary clinical studies on CBD included research for the treatment of anxiety, movement disorders, and pain. CBD is available and consumed in multiple forms, including inhalation of smoke or vapor, as an aerosol spray, and by mouth. It may be supplied as an oil containing CBD, capsules, dried cannabis, or as a liquid solution. CBD is thought not to be as psychoactive as THC (delta-9-tetrahydrocannabinol - the chemical in marijuana responsible for the "HIGH"). Studies suggest that CBD may interact with different biological target receptors in the body, including cannabinoid and other neurotransmitter receptors. As of 2018 the mechanism of action for its biological effects has not been determined.  Side-effects  Adverse reactions: Dry mouth, diarrhea, decreased appetite, fatigue, drowsiness, malaise, weakness, sleep disturbances, and others.  Drug interactions: CBC may interact with other medications such  as blood-thinners. (Last update: 07/30/2020) ____________________________________________________________________________________________   ____________________________________________________________________________________________  Medication Rules  Purpose: To inform patients, and their family members, of our rules and regulations.  Applies  to: All patients receiving prescriptions (written or electronic).  Pharmacy of record: Pharmacy where electronic prescriptions will be sent. If written prescriptions are taken to a different pharmacy, please inform the nursing staff. The pharmacy listed in the electronic medical record should be the one where you would like electronic prescriptions to be sent.  Electronic prescriptions: In compliance with the [place] (STOP) Act of 2017 (Session Lanny Cramp [phone]), effective December 24, 2018, all controlled substances must be electronically prescribed. Calling prescriptions to the pharmacy will cease to exist.  Prescription refills: Only during scheduled appointments. Applies to all prescriptions.  NOTE: The following applies primarily to controlled substances (Opioid* Pain Medications).   Type of encounter (visit): For patients receiving controlled substances, face-to-face visits are required. (Not an option or up to the patient.)  Patient's responsibilities: 1. Pain Pills: Bring all pain pills to every appointment (except for procedure appointments). 2. Pill Bottles: Bring pills in original pharmacy bottle. Always bring the newest bottle. Bring bottle, even if empty. 3. Medication refills: You are responsible for knowing and keeping track of what medications you take and those you need refilled. The day before your appointment: write a list of all prescriptions that need to be refilled. The day of the appointment: give the list to the admitting nurse. Prescriptions will be written only during appointments. No prescriptions will be written on procedure days. If you forget a medication: it will not be "Called in", "Faxed", or "electronically sent". You will need to get another appointment to get these prescribed. No early refills. Do not call asking to have your prescription filled early. 4. Prescription Accuracy: You are responsible for carefully  inspecting your prescriptions before leaving our office. Have the discharge nurse carefully go over each prescription with you, before taking them home. Make sure that your name is accurately spelled, that your address is correct. Check the name and dose of your medication to make sure it is accurate. Check the number of pills, and the written instructions to make sure they are clear and accurate. Make sure that you are given enough medication to last until your next medication refill appointment. 5. Taking Medication: Take medication as prescribed. When it comes to controlled substances, taking less pills or less frequently than prescribed is permitted and encouraged. Never take more pills than instructed. Never take medication more frequently than prescribed.  6. Inform other Doctors: Always inform, all of your healthcare providers, of all the medications you take. 7. Pain Medication from other Providers: You are not allowed to accept any additional pain medication from any other Doctor or Healthcare provider. There are two exceptions to this rule. (see below) In the event that you require additional pain medication, you are responsible for notifying us, as stated below. 8. Cough Medicine: Often these contain an opioid, such as codeine or hydrocodone. Never accept or take cough medicine containing these opioids if you are already taking an opioid* medication. The combination may cause respiratory failure and death. 9. Medication Agreement: You are responsible for carefully reading and following our Medication Agreement. This must be signed before receiving any prescriptions from our practice. Safely store a copy of your signed Agreement. Violations to the Agreement will result in no further prescriptions. (Additional copies of our Medication Agreement are available upon request.) 10. Laws, Rules, &  Regulations: All patients are expected to follow all Federal and [employer], [employer], Rules, [employer].  Ignorance of the Laws does not constitute a valid excuse.  11. Illegal drugs and Controlled Substances: The use of illegal substances (including, but not limited to marijuana and its derivatives) and/or the illegal use of any controlled substances is strictly prohibited. Violation of this rule may result in the immediate and permanent discontinuation of any and all prescriptions being written by our practice. The use of any illegal substances is prohibited. 12. Adopted CDC guidelines & recommendations: Target dosing levels will be at or below 60 MME/day. Use of benzodiazepines** is not recommended.  Exceptions: There are only two exceptions to the rule of not receiving pain medications from other Healthcare Providers. 1. Exception #1 (Emergencies): In the event of an emergency (i.e.: accident requiring emergency care), you are allowed to receive additional pain medication. However, you are responsible for: As soon as you are able, call our office [phone], at any time of the day or night, and leave a message stating your name, the date and nature of the emergency, and the name and dose of the medication prescribed. In the event that your call is answered by a member of our staff, make sure to document and save the date, time, and the name of the person that took your information.  2. Exception #2 (Planned Surgery): In the event that you are scheduled by another doctor or dentist to have any type of surgery or procedure, you are allowed (for a period no longer than 30 days), to receive additional pain medication, for the acute post-op pain. However, in this case, you are responsible for picking up a copy of our "Post-op Pain Management for Surgeons" handout, and giving it to your surgeon or dentist. This document is available at our office, and does not require an appointment to obtain it. Simply go to our office during business hours (Monday-Thursday from 8:00 AM to 4:00 PM) (Friday 8:00 AM to 12:00  Noon) or if you have a scheduled appointment with [name], prior to your surgery, and ask for it by name. In addition, you are responsible for: calling our office [phone], at any time of the day or night, and leaving a message stating your name, name of your surgeon, type of surgery, and date of procedure or surgery. Failure to comply with your responsibilities may result in termination of therapy involving the controlled substances.  *Opioid medications include: morphine, codeine, oxycodone, oxymorphone, hydrocodone, hydromorphone, meperidine, tramadol, tapentadol, buprenorphine, fentanyl, methadone. **Benzodiazepine medications include: diazepam (Valium), alprazolam (Xanax), clonazepam (Klonopine), lorazepam (Ativan), clorazepate (Tranxene), chlordiazepoxide (Librium), estazolam (Prosom), oxazepam (Serax), temazepam (Restoril), triazolam (Halcion) (Last updated: 11/21/2020) ____________________________________________________________________________________________    Trigger Point Injection Trigger points are areas where you have pain. A trigger point injection is a shot given in the trigger point to help relieve pain for a few days to a few months. Common places for trigger points include:  The neck.  The shoulders.  The upper back.  The lower back. A trigger point injection will not cure long-term (chronic) pain permanently. These injections do not always work for every person. For some people, they can help to relieve pain for a few days to a few months. Tell a health care provider about:  Any allergies you have.  All medicines you are taking, including vitamins, herbs, eye drops, creams, and over-the-counter medicines.  Any problems you or family members have had with anesthetic medicines.  Any blood disorders  you have.  Any surgeries you have had.  Any medical conditions you have. What are the risks? Generally, this is a safe procedure. However, problems may occur,  including:  Infection.  Bleeding or bruising.  Allergic reaction to the injected medicine.  Irritation of the skin around the injection site. What happens before the procedure? Ask your health care provider about:  Changing or stopping your regular medicines. This is especially important if you are taking diabetes medicines or blood thinners.  Taking medicines such as aspirin and ibuprofen. These medicines can thin your blood. Do not take these medicines unless your health care provider tells you to take them.  Taking over-the-counter medicines, vitamins, herbs, and supplements. What happens during the procedure?  Your health care provider will feel for trigger points. A marker may be used to circle the area for the injection.  The skin over the trigger point will be washed with a germ-killing (antiseptic) solution.  A thin needle is used for the injection. You may feel pain or a twitching feeling when the needle enters the trigger point.  A numbing solution may be injected into the trigger point. Sometimes a medicine to keep down inflammation is also injected.  Your health care provider may move the needle around the area where the trigger point is located until the tightness and twitching goes away.  After the injection, your health care provider may put gentle pressure over the injection site.  The injection site will be covered with a bandage (dressing). The procedure may vary among health care providers and hospitals.   What can I expect after treatment? After treatment, you may have:  Soreness and stiffness for 1-2 days.  A dressing. This can be taken off in a few hours or as told by your health care provider. Follow these instructions at home: Injection site care  Remove your dressing as told by your health care provider.  Check your injection site every day for signs of infection. Check for: ? Redness, swelling, or pain. ? Fluid or blood. ? Warmth. ? Pus or a  bad smell. Managing pain, stiffness, and swelling  If directed, put ice on the affected area. ? Put ice in a plastic bag. ? Place a towel between your skin and the bag. ? Leave the ice on for 20 minutes, 2-3 times a day. General instructions  If you were asked to stop your regular medicines, ask your health care provider when you may start taking them again.  Return to your normal activities as told by your health care provider. Ask your health care provider what activities are safe for you.  Do not take baths, swim, or use a hot tub until your health care provider approves.  You may be asked to see an occupational or physical therapist for exercises that reduce muscle strain and stretch the area of the trigger point.  Keep all follow-up visits as told by your health care provider. This is important. Contact a health care provider if:  Your pain comes back, and it is worse than before the injection. You may need more injections.  You have chills or a fever.  The injection site becomes more painful, red, swollen, or warm to the touch. Summary  A trigger point injection is a shot given in the trigger point to help relieve pain for a few days to a few months.  Common places for trigger point injections are the neck, shoulder, upper back, and lower back.  These injections do not  always work for every person, but for some people, the injections can help to relieve pain for a few days to a few months.  Contact a health care provider if symptoms come back or they are worse than before treatment. Also, get help if the injection site becomes more painful, red, swollen, or warm to the touch. This information is not intended to replace advice given to you by your health care provider. Make sure you discuss any questions you have with your health care provider. Document Revised: 01/21/2019 Document Reviewed: 01/21/2019 Elsevier Patient Education  [employer]. Pain Management Discharge  Instructions  General Discharge Instructions :  If you need to reach your doctor call: Monday-Friday 8:00 am - 4:00 pm at [phone] or toll free [phone].  After clinic hours [phone] to have operator reach doctor.  Bring all of your medication bottles to all your appointments in the pain clinic.  To cancel or reschedule your appointment with Pain Management please remember to call 24 hours in advance to avoid a fee.  Refer to the educational materials which you have been given on: General Risks, I had my Procedure. Discharge Instructions, Post Sedation.  Post Procedure Instructions:  The drugs you were given will stay in your system until tomorrow, so for the next 24 hours you should not drive, make any legal decisions or drink any alcoholic beverages.  You may eat anything you prefer, but it is better to start with liquids then soups and crackers, and gradually work up to solid foods.  Please notify your doctor immediately if you have any unusual bleeding, trouble breathing or pain that is not related to your normal pain.  Depending on the type of procedure that was done, some parts of your body may feel week and/or numb.  This usually clears up by tonight or the next day.  Walk with the use of an assistive device or accompanied by an adult for the 24 hours.  You may use ice on the affected area for the first 24 hours.  Put ice in a Ziploc bag and cover with a towel and place against area 15 minutes on 15 minutes off.  You may switch to heat after 24 hours.

## 2021-05-10 NOTE — Progress Notes (Signed)
Nursing Pain Medication Assessment:  Safety precautions to be maintained throughout the outpatient stay will include: orient to surroundings, keep bed in low position, maintain call bell within reach at all times, provide assistance with transfer out of bed and ambulation.  Medication Inspection Compliance: Pill count conducted under aseptic conditions, in front of the patient. Neither the pills nor the bottle was removed from the patient's sight at any time. Once count was completed pills were immediately returned to the patient in their original bottle.  Medication: Oxycodone IR Pill/Patch Count: 16 of 60 pills remain Pill/Patch Appearance: Markings consistent with prescribed medication Bottle Appearance: Standard pharmacy container. Clearly labeled. Filled Date: 05 / 03 / 2022 Last Medication intake:  Yesterday

## 2021-05-11 ENCOUNTER — Encounter (HOSPITAL_COMMUNITY): Payer: Self-pay | Admitting: Psychiatry

## 2021-05-11 ENCOUNTER — Telehealth (INDEPENDENT_AMBULATORY_CARE_PROVIDER_SITE_OTHER): Payer: 59 | Admitting: Psychiatry

## 2021-05-11 DIAGNOSIS — F4310 Post-traumatic stress disorder, unspecified: Secondary | ICD-10-CM

## 2021-05-11 DIAGNOSIS — F331 Major depressive disorder, recurrent, moderate: Secondary | ICD-10-CM

## 2021-05-11 DIAGNOSIS — F410 Panic disorder [episodic paroxysmal anxiety] without agoraphobia: Secondary | ICD-10-CM | POA: Diagnosis not present

## 2021-05-11 MED ORDER — BENZTROPINE MESYLATE 0.5 MG PO TABS: 0.5000 mg | ORAL_TABLET | Freq: Every day | ORAL | 0 refills | Status: DC

## 2021-05-11 MED ORDER — FLUOXETINE HCL 60 MG PO TABS: 60.0000 mg | ORAL_TABLET | Freq: Every day | ORAL | 0 refills | Status: DC

## 2021-05-11 MED ORDER — LAMOTRIGINE 200 MG PO TABS: 200.0000 mg | ORAL_TABLET | Freq: Every day | ORAL | 0 refills | Status: DC

## 2021-05-11 MED ORDER — CHLORPROMAZINE HCL 100 MG PO TABS: ORAL_TABLET | ORAL | 0 refills | Status: DC

## 2021-05-11 MED ORDER — CLONAZEPAM 0.5 MG PO TABS: 0.5000 mg | ORAL_TABLET | Freq: Three times a day (TID) | ORAL | 2 refills | Status: DC | PRN
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
## 2021-05-23 ENCOUNTER — Ambulatory Visit: Payer: Managed Care, Other (non HMO) | Admitting: Endocrinology

## 2021-05-25 ENCOUNTER — Ambulatory Visit: Payer: Managed Care, Other (non HMO) | Admitting: Pain Medicine

## 2021-05-25 ENCOUNTER — Ambulatory Visit (HOSPITAL_BASED_OUTPATIENT_CLINIC_OR_DEPARTMENT_OTHER): Payer: Managed Care, Other (non HMO) | Admitting: Pain Medicine

## 2021-05-25 ENCOUNTER — Ambulatory Visit
Admission: RE | Admit: 2021-05-25 | Discharge: 2021-05-25 | Disposition: A | Discharge status: Home / Self Care | Payer: Managed Care, Other (non HMO) | Attending: Pain Medicine | Admitting: Pain Medicine

## 2021-05-25 ENCOUNTER — Encounter: Payer: Self-pay | Admitting: Pain Medicine

## 2021-05-25 ENCOUNTER — Other Ambulatory Visit: Payer: Self-pay

## 2021-05-25 ENCOUNTER — Ambulatory Visit
Admission: RE | Admit: 2021-05-25 | Discharge: 2021-05-25 | Disposition: A | Discharge status: Home / Self Care | Payer: Managed Care, Other (non HMO) | Source: Ambulatory Visit | Attending: Pain Medicine | Admitting: Pain Medicine

## 2021-05-25 VITALS — BP 113/70 | HR 87 | Temp 97.2°F | Resp 12 | Ht 67.0 in | Wt 196.0 lb

## 2021-05-25 DIAGNOSIS — M7602 Gluteal tendinitis, left hip: Secondary | ICD-10-CM | POA: Insufficient documentation

## 2021-05-25 DIAGNOSIS — M25522 Pain in left elbow: Secondary | ICD-10-CM | POA: Insufficient documentation

## 2021-05-25 DIAGNOSIS — M25552 Pain in left hip: Secondary | ICD-10-CM | POA: Diagnosis present

## 2021-05-25 DIAGNOSIS — M7062 Trochanteric bursitis, left hip: Secondary | ICD-10-CM

## 2021-05-25 DIAGNOSIS — M1612 Unilateral primary osteoarthritis, left hip: Secondary | ICD-10-CM | POA: Diagnosis present

## 2021-05-25 DIAGNOSIS — G8929 Other chronic pain: Secondary | ICD-10-CM | POA: Insufficient documentation

## 2021-05-25 DIAGNOSIS — Z7901 Long term (current) use of anticoagulants: Secondary | ICD-10-CM | POA: Insufficient documentation

## 2021-05-25 DIAGNOSIS — M67952 Unspecified disorder of synovium and tendon, left thigh: Secondary | ICD-10-CM

## 2021-05-25 MED ORDER — LIDOCAINE HCL 2 % IJ SOLN
20.0000 mL | Freq: Once | INTRAMUSCULAR | Status: AC
  Administered 2021-05-25: 400 mg

## 2021-05-25 MED ORDER — LACTATED RINGERS IV SOLN
1000.0000 mL | Freq: Once | INTRAVENOUS | Status: AC
  Administered 2021-05-25: 1000 mL via INTRAVENOUS

## 2021-05-25 MED ORDER — METHYLPREDNISOLONE ACETATE 80 MG/ML IJ SUSP
80.0000 mg | Freq: Once | INTRAMUSCULAR | Status: AC
  Administered 2021-05-25: 80 mg via INTRA_ARTICULAR
  Filled 2021-05-25: qty 1

## 2021-05-25 MED ORDER — FENTANYL CITRATE (PF) 100 MCG/2ML IJ SOLN
25.0000 ug | INTRAMUSCULAR | Status: DC | PRN
Start: 2021-05-25 — End: 2021-05-25
  Administered 2021-05-25: 50 ug via INTRAVENOUS
  Filled 2021-05-25: qty 2

## 2021-05-25 MED ORDER — ROPIVACAINE HCL 2 MG/ML IJ SOLN
9.0000 mL | Freq: Once | INTRAMUSCULAR | Status: AC
  Administered 2021-05-25: 9 mL via INTRA_ARTICULAR

## 2021-05-25 MED ORDER — MIDAZOLAM HCL 5 MG/5ML IJ SOLN
1.0000 mg | INTRAMUSCULAR | Status: DC | PRN
  Administered 2021-05-25: 2 mg via INTRAVENOUS
  Filled 2021-05-25: qty 5

## 2021-05-25 MED ORDER — IOHEXOL 180 MG/ML  SOLN
10.0000 mL | Freq: Once | INTRAMUSCULAR | Status: AC
  Administered 2021-05-25: 10 mL via INTRA_ARTICULAR

## 2021-05-25 NOTE — Progress Notes (Signed)
PROVIDER NOTE: Information contained herein reflects review and annotations entered in association with encounter. Interpretation of such information and data should be left to medically-trained personnel. Information provided to patient can be located elsewhere in the medical record under "Patient Instructions". Document created using STT-dictation technology, any transcriptional errors that may result from process are unintentional.    Patient: Erika [name]  Service Category: Procedure  Provider: Gaspar Cola, MD  DOB: 1971-04-20  DOS: 05/25/2021  Location: [facility]  MRN: [id number]  Setting: Ambulatory - outpatient  Referring Provider: Lesleigh Noe, MD  Type: Established Patient  Specialty: Interventional Pain Management  PCP: Lesleigh Noe, MD   Primary Reason for Visit: Interventional Pain Management Treatment. CC: Hip Pain (Left ) and Elbow Pain (Left )  Procedure #1:  Anesthesia, Analgesia, Anxiolysis:  Type: Intra-Articular Hip Injection #3  Primary Purpose: Therapeutic/palliative Region: Posterolateral hip joint area. Level: Lower pelvic and hip joint level. Target Area: Superior aspect of the hip joint cavity, going thru the superior portion of the capsular ligament. Approach: Posterolateral approach. Laterality: Left  Type: Moderate (Conscious) Sedation combined with Local Anesthesia Indication(s): Analgesia and Anxiety Route: Intravenous (IV) IV Access: Secured Sedation: Meaningful verbal contact was maintained at all times during the procedure  Local Anesthetic: Lidocaine 1-2%  Position: Lateral Decubitus with bad side up Area Prepped: Entire Posterolateral hip area. DuraPrep (Iodine Povacrylex [0.7% available iodine] and Isopropyl Alcohol, 74% w/w)   Procedure #2:    Type: Trochanteric Bursa Injection #1  Primary Purpose: Diagnostic Region: Upper (proximal) Femoral Region Level: Hip Joint Target Area: Superior aspect of the hip joint  cavity, going thru the superior portion of the capsular ligament. Approach: Posterolateral approach Laterality: Left     Indications: 1. Chronic hip pain (Left)   2. Osteoarthritis of hip (Left)   3. Greater trochanteric bursitis of hip (Left)   4. Gluteal tendonitis of buttock (Left)   5. Tendinopathy of gluteus medius (Left)   6. Chronic anticoagulation (PLAVIX)    Pain Score: Pre-procedure: 7 /10 Post-procedure: 5 /10   Pre-op H&P Assessment:  Erika [name] is a 50 y.o. (year old), female patient, seen today for interventional treatment. She  has a past surgical history that includes Cholecystectomy; Tonsillectomy; Cesarean section; Cardiac catheterization ([name], 04/18/2016); Cardiac catheterization ([name], 06/29/2016); Coronary artery bypass graft ([name], 07/06/2016); TEE without cardioversion ([name], 07/06/2016); Endarterectomy (Right, 07/06/2016); Cardiac catheterization ([name], 08/29/2016); ABDOMINAL AORTOGRAM W/LOWER EXTREMITY ([name], 10/15/2018); PERIPHERAL VASCULAR INTERVENTION (Left, 10/15/2018); ABDOMINAL AORTOGRAM W/LOWER EXTREMITY (Bilateral, 08/19/2019); Endarterectomy (Right, 04/27/2020); and LEFT HEART CATH AND CORS/GRAFTS ANGIOGRAPHY ([name], 08/24/2020). Erika [name] has a current medication list which includes the following prescription(s): albuterol, amlodipine, benztropine, carvedilol, chlorpromazine, clonazepam, clonidine, clopidogrel, dapagliflozin propanediol, trulicity, fluoxetine hcl, basaglar kwikpen, insulin syringe-needle u-100, isosorbide mononitrate, lamotrigine, losartan, naloxone, nicotine, nitroglycerin, ondansetron, oxycodone, [START ON 06/23/2021] oxycodone, [START ON 07/23/2021] oxycodone, pregabalin, repatha sureclick, rosuvastatin, spironolactone, torsemide, and insulin lispro, and the following Facility-Administered Medications: fentanyl and midazolam. Her primarily concern today is the Hip Pain (Left ) and Elbow Pain (Left )  Initial Vital Signs:  Pulse/HCG Rate: 87ECG Heart Rate:  84 Temp: (!) 97.2 F (36.2 C) Resp: 16 BP: (!) 158/86 SpO2: 97 %  BMI: Estimated body mass index is 30.7 kg/m as calculated from the following:   Height as of this encounter: 5\' 7"  (1.702 m).   Weight as of this encounter: 196 lb (88.9 kg).  Risk Assessment: Allergies: Reviewed. She is allergic to chantix [varenicline tartrate].  Allergy Precautions:  None required Coagulopathies: Reviewed. None identified.  Blood-thinner therapy: None at this time Active Infection(s): Reviewed. None identified. Erika [name] is afebrile  Site Confirmation: Erika [name] was asked to confirm the procedure and laterality before marking the site Procedure checklist: Completed Consent: Before the procedure and under the influence of no sedative(s), amnesic(s), or anxiolytics, the patient was informed of the treatment options, risks and possible complications. To fulfill our ethical and legal obligations, as recommended by the American Medical Association's Code of Ethics, I have informed the patient of my clinical impression; the nature and purpose of the treatment or procedure; the risks, benefits, and possible complications of the intervention; the alternatives, including doing nothing; the risk(s) and benefit(s) of the alternative treatment(s) or procedure(s); and the risk(s) and benefit(s) of doing nothing. The patient was provided information about the general risks and possible complications associated with the procedure. These may include, but are not limited to: failure to achieve desired goals, infection, bleeding, organ or nerve damage, allergic reactions, paralysis, and death. In addition, the patient was informed of those risks and complications associated to the procedure, such as failure to decrease pain; infection; bleeding; organ or nerve damage with subsequent damage to sensory, motor, and/or autonomic systems, resulting in permanent pain, numbness, and/or weakness of one or several areas of the  body; allergic reactions; (i.e.: anaphylactic reaction); and/or death. Furthermore, the patient was informed of those risks and complications associated with the medications. These include, but are not limited to: allergic reactions (i.e.: anaphylactic or anaphylactoid reaction(s)); adrenal axis suppression; blood sugar elevation that in diabetics may result in ketoacidosis or comma; water retention that in patients with history of congestive heart failure may result in shortness of breath, pulmonary edema, and decompensation with resultant heart failure; weight gain; swelling or edema; medication-induced neural toxicity; particulate matter embolism and blood vessel occlusion with resultant organ, and/or nervous system infarction; and/or aseptic necrosis of one or more joints. Finally, the patient was informed that Medicine is not an exact science; therefore, there is also the possibility of unforeseen or unpredictable risks and/or possible complications that may result in a catastrophic outcome. The patient indicated having understood very clearly. We have given the patient no guarantees and we have made no promises. Enough time was given to the patient to ask questions, all of which were answered to the patient's satisfaction. Ms. Nauman has indicated that she wanted to continue with the procedure. Attestation: I, the ordering provider, attest that I have discussed with the patient the benefits, risks, side-effects, alternatives, likelihood of achieving goals, and potential problems during recovery for the procedure that I have provided informed consent. Date  Time: 05/25/2021  9:55 AM  Pre-Procedure Preparation:  Monitoring: As per clinic protocol. Respiration, ETCO2, SpO2, BP, heart rate and rhythm monitor placed and checked for adequate function Safety Precautions: Patient was assessed for positional comfort and pressure points before starting the procedure. Time-out: I initiated and conducted the  "Time-out" before starting the procedure, as per protocol. The patient was asked to participate by confirming the accuracy of the "Time Out" information. Verification of the correct person, site, and procedure were performed and confirmed by me, the nursing staff, and the patient. "Time-out" conducted as per Joint Commission's Universal Protocol (UP.01.01.01). Time: 1031  Description of Procedure #1:  Safety Precautions: Aspiration looking for blood return was conducted prior to all injections. At no point did we inject any substances, as a needle was being advanced. No attempts were made at seeking any paresthesias.  Safe injection practices and needle disposal techniques used. Medications properly checked for expiration dates. SDV (single dose vial) medications used. Description of the Procedure: Protocol guidelines were followed. The patient was placed in position over the fluoroscopy table. The target area was identified and the area prepped in the usual manner. Skin & deeper tissues infiltrated with local anesthetic. Appropriate amount of time allowed to pass for local anesthetics to take effect. The procedure needles were then advanced to the target area. Proper needle placement secured. Negative aspiration confirmed. Solution injected in intermittent fashion, asking for systemic symptoms every 0.5cc of injectate. The needles were then removed and the area cleansed, making sure to leave some of the prepping solution back to take advantage of its long term bactericidal properties.  Start Time: 1031 hrs. Materials:  Needle(s) Type: Spinal Needle Gauge: 22G Length: 7-in Medication(s): Please see orders for medications and dosing details.  Imaging Guidance (Non-Spinal):          Type of Imaging Technique: Fluoroscopy Guidance (Non-Spinal) Indication(s): Assistance in needle guidance and placement for procedures requiring needle placement in or near specific anatomical locations not easily accessible  without such assistance. Exposure Time: Please see nurses notes. Contrast: Before injecting any contrast, we confirmed that the patient did not have an allergy to iodine, shellfish, or radiological contrast. Once satisfactory needle placement was completed at the desired level, radiological contrast was injected. Contrast injected under live fluoroscopy. No contrast complications. See chart for type and volume of contrast used. Fluoroscopic Guidance: I was personally present during the use of fluoroscopy. "Tunnel Vision Technique" used to obtain the best possible view of the target area. Parallax error corrected before commencing the procedure. "Direction-depth-direction" technique used to introduce the needle under continuous pulsed fluoroscopy. Once target was reached, antero-posterior, oblique, and lateral fluoroscopic projection used confirm needle placement in all planes. Images permanently stored in EMR. Interpretation: I personally interpreted the imaging intraoperatively. Adequate needle placement confirmed in multiple planes. Appropriate spread of contrast into desired area was observed. No evidence of afferent or efferent intravascular uptake. Permanent images saved into the patient's record.   Description of Procedure #2:  Description of the Procedure: Skin & deeper tissues infiltrated with local anesthetic. Appropriate amount of time allowed to pass for local anesthetics to take effect. The procedure needles were then advanced to the target area. Proper needle placement secured. Negative aspiration confirmed. Solution injected in intermittent fashion, asking for systemic symptoms every 0.5cc of injectate. The needles were then removed and the area cleansed, making sure to leave some of the prepping solution back to take advantage of its long term bactericidal properties.  Vitals:   05/25/21 1038 05/25/21 1048 05/25/21 1058 05/25/21 1109  BP: 125/70 128/73 138/65 113/70  Pulse:      Resp: 18  17 17 12   Temp:      TempSrc:      SpO2: 93% 93% 92% 90%  Weight:      Height:         End Time: 1038 hrs.      Materials:  Needle(s) Type: Spinal Needle Gauge: 22G Length: 7-in Medication(s): Please see orders for medications and dosing details.  Imaging Guidance (Non-Spinal):          Type of Imaging Technique: Fluoroscopy Guidance (Non-Spinal) Indication(s): Assistance in needle guidance and placement for procedures requiring needle placement in or near specific anatomical locations not easily accessible without such assistance. Exposure Time: Please see nurses notes. Contrast: Before injecting any contrast, we confirmed  that the patient did not have an allergy to iodine, shellfish, or radiological contrast. Once satisfactory needle placement was completed at the desired level, radiological contrast was injected. Contrast injected under live fluoroscopy. No contrast complications. See chart for type and volume of contrast used. Fluoroscopic Guidance: I was personally present during the use of fluoroscopy. "Tunnel Vision Technique" used to obtain the best possible view of the target area. Parallax error corrected before commencing the procedure. "Direction-depth-direction" technique used to introduce the needle under continuous pulsed fluoroscopy. Once target was reached, antero-posterior, oblique, and lateral fluoroscopic projection used confirm needle placement in all planes. Images permanently stored in EMR. Interpretation: I personally interpreted the imaging intraoperatively. Adequate needle placement confirmed in multiple planes. Appropriate spread of contrast into desired area was observed. No evidence of afferent or efferent intravascular uptake. Permanent images saved into the patient's record.  Antibiotic Prophylaxis:   Anti-infectives (From admission, onward)   None     Indication(s): None identified  Post-operative Assessment:  Post-procedure Vital Signs:  Pulse/HCG  Rate: 8782 Temp: (!) 97.2 F (36.2 C) Resp: 12 BP: 113/70 SpO2: 90 %  EBL: None  Complications: No immediate post-treatment complications observed by team, or reported by patient.  Note: The patient tolerated the entire procedure well. A repeat set of vitals were taken after the procedure and the patient was kept under observation following institutional policy, for this type of procedure. Post-procedural neurological assessment was performed, showing return to baseline, prior to discharge. The patient was provided with post-procedure discharge instructions, including a section on how to identify potential problems. Should any problems arise concerning this procedure, the patient was given instructions to immediately contact us, at any time, without hesitation. In any case, we plan to contact the patient by telephone for a follow-up status report regarding this interventional procedure.  Comments:  No additional relevant information.  Plan of Care  Orders:  Orders Placed This Encounter  Procedures  . HIP INJECTION    Scheduling Instructions:     Side: Left-sided     Sedation: Patient's choice.     Timeframe: Today  . HIP INJECTION    Purpose: Therapeutic Indication: Hip pain 2ry to Trochanteric Burlitis left (M70.62).    Scheduling Instructions:     Procedure: Trochanteric bursa injection     Laterality: Left-sided     Sedation: Patient's choice.     Timeframe: Today  . DG PA[facility] C-ARM 1-60 MIN NO REPORT    Intraoperative interpretation by procedural physician at [facility].    Standing Status:   Standing    Number of Occurrences:   1    Order Specific Question:   Reason for exam:    Answer:   Assistance in needle guidance and placement for procedures requiring needle placement in or near specific anatomical locations not easily accessible without such assistance.  . Informed Consent Details: Physician/Practitioner Attestation; Transcribe to consent form and  obtain patient signature    Nursing Order: Transcribe to consent form and obtain patient signature. Note: Always confirm laterality of pain with Ms. Hayduk, before procedure.    Order Specific Question:   Physician/Practitioner attestation of informed consent for procedure/surgical case    Answer:   I, the physician/practitioner, attest that I have discussed with the patient the benefits, risks, side effects, alternatives, likelihood of achieving goals and potential problems during recovery for the procedure that I have provided informed consent.    Order Specific Question:   Procedure    Answer:  Hip injection    Order Specific Question:   Physician/Practitioner performing the procedure    Answer:   Chesney Suares A. Dossie Arbour, MD    Order Specific Question:   Indication/Reason    Answer:   Hip Joint Pain (Arthralgia)  . Informed Consent Details: Physician/Practitioner Attestation; Transcribe to consent form and obtain patient signature    Note: Always confirm laterality of pain with Ms. Farrey, before procedure. Transcribe to consent form and obtain patient signature.    Order Specific Question:   Physician/Practitioner attestation of informed consent for procedure/surgical case    Answer:   I, the physician/practitioner, attest that I have discussed with the patient the benefits, risks, side effects, alternatives, likelihood of achieving goals and potential problems during recovery for the procedure that I have provided informed consent.    Order Specific Question:   Procedure    Answer:   Hip bursa injection    Order Specific Question:   Physician/Practitioner performing the procedure    Answer:   Breslin Burklow A. Dossie Arbour, MD    Order Specific Question:   Indication/Reason    Answer:   Hip bursitis  . Care order/instruction: Please confirm that the patient has stopped the Plavix (Clopidogrel) x 7-10 days prior to procedure or surgery.    Please confirm that the patient has stopped the Plavix  (Clopidogrel) x 7-10 days prior to procedure or surgery.    Standing Status:   Standing    Number of Occurrences:   1  . Provide equipment / supplies at bedside    "Epidural Tray" (Disposable  single use) Catheter: NOT required    Standing Status:   Standing    Number of Occurrences:   1    Order Specific Question:   Specify    Answer:   Epidural Tray  . Bleeding precautions    Standing Status:   Standing    Number of Occurrences:   1   Chronic Opioid Analgesic:  Oxycodone IR 5 mg. 1 tab PO BID (10 mg/day of oxycodone) MME/day: 15 mg/day.   Medications ordered for procedure: Meds ordered this encounter  Medications  . iohexol (OMNIPAQUE) 180 MG/ML injection 10 mL    Must be Myelogram-compatible. If not available, you may substitute with a water-soluble, non-ionic, hypoallergenic, myelogram-compatible radiological contrast medium.  Marland Kitchen lidocaine (XYLOCAINE) 2 % (with pres) injection 400 mg  . lactated ringers infusion 1,000 mL  . midazolam (VERSED) 5 MG/5ML injection 1-2 mg    Make sure Flumazenil is available in the pyxis when using this medication. If oversedation occurs, administer 0.2 mg IV over 15 sec. If after 45 sec no response, administer 0.2 mg again over 1 min; may repeat at 1 min intervals; not to exceed 4 doses (1 mg)  . fentaNYL (SUBLIMAZE) injection 25-50 mcg    Make sure Narcan is available in the pyxis when using this medication. In the event of respiratory depression (RR< 8/min): Titrate NARCAN (naloxone) in increments of 0.1 to 0.2 mg IV at 2-3 minute intervals, until desired degree of reversal.  . ropivacaine (PF) 2 mg/mL (0.2%) (NAROPIN) injection 9 mL  . methylPREDNISolone acetate (DEPO-MEDROL) injection 80 mg   Medications administered: We administered iohexol, lidocaine, lactated ringers, midazolam, fentaNYL, ropivacaine (PF) 2 mg/mL (0.2%), and methylPREDNISolone acetate.  See the medical record for exact dosing, route, and time of administration.  Follow-up  plan:   Return for procedure day (afternoon VV) (PPE).       Interventional Therapies  Risk  Complexity Considerations:  Estimated body mass index is 31.01 kg/m as calculated from the following:   Height as of this encounter: 5\' 7"  (1.702 m).   Weight as of this encounter: 198 lb (89.8 kg). NOTE: PLAVIX ANTICOAGULATION(Stop:7-10 days pre-procedure; Restart:2 hours post-proc.)   Planned  Pending:   Therapeutic left IA hip joint injection #3  Therapeutic left peri-insertional gluteus medius tendon injection #4  Diagnostic/therapeutic left elbow injection #1  Diagnostic/therapeutic left trochanteric bursa injection #1    Under consideration:   Diagnostic/therapeutic left elbow injection #1  Diagnostic right facial NB Diagnostic right geniculate NB  Diagnostic right occipital NB  Diagnostic Botox injections   Completed:   Therapeutic left peri-insertional gluteus medius tendon injection x3 (02/23/2021) (100/100/100/100) Therapeutic left IA hip joint injection x2 (02/23/2021) (100/100/100/100) Therapeutic/palliative Facial (CN VII)/GeniculateNB x3 (03/05/2019) (100/100/0/0)   Therapeutic  Palliative (PRN) options:   Therapeutic left peri-insertional gluteus medius tendon injection #4   Therapeutic left IA hip joint injection #3  Therapeutic/palliative Facial (CN VII) NB #4      Recent Visits Date Type Provider Dept  05/10/21 Office Visit Milinda Pointer, [facility]  03/09/21 Telemedicine Milinda Pointer, MD [facility]  Showing recent visits within past 90 days and meeting all other requirements Today's Visits Date Type Provider Dept  05/25/21 Procedure visit Milinda Pointer, MD [facility]  Showing today's visits and meeting all other requirements Future Appointments Date Type Provider Dept  06/15/21 Appointment Milinda Pointer, MD [facility]  08/21/21 Appointment Milinda Pointer, MD [facility]   Showing future appointments within next 90 days and meeting all other requirements  Disposition: Discharge home  Discharge (Date  Time): 05/25/2021; 1110 hrs.   Primary Care Physician: Lesleigh Noe, MD Location: [facility] Note by: Gaspar Cola, MD Date: 05/25/2021; Time: 12:48 PM  Disclaimer:  Medicine is not an Chief Strategy Officer. The only guarantee in medicine is that nothing is guaranteed. It is important to note that the decision to proceed with this intervention was based on the information collected from the patient. The Data and conclusions were drawn from the patient's questionnaire, the interview, and the physical examination. Because the information was provided in large part by the patient, it cannot be guaranteed that it has not been purposely or unconsciously manipulated. Every effort has been made to obtain as much relevant data as possible for this evaluation. It is important to note that the conclusions that lead to this procedure are derived in large part from the available data. Always take into account that the treatment will also be dependent on availability of resources and existing treatment guidelines, considered by other Pain Management Practitioners as being common knowledge and practice, at the time of the intervention. For Medico-Legal purposes, it is also important to point out that variation in procedural techniques and pharmacological choices are the acceptable norm. The indications, contraindications, technique, and results of the above procedure should only be interpreted and judged by a Board-Certified Interventional Pain Specialist with extensive familiarity and expertise in the same exact procedure and technique.

## 2021-05-25 NOTE — Patient Instructions (Signed)
____________________________________________________________________________________________  Post-Procedure Discharge Instructions  Instructions:  Apply ice:   Purpose: This will minimize any swelling and discomfort after procedure.   When: Day of procedure, as soon as you get home.  How: Fill a plastic sandwich bag with crushed ice. Cover it with a small towel and apply to injection site.  How long: (15 min on, 15 min off) Apply for 15 minutes then remove x 15 minutes.  Repeat sequence on day of procedure, until you go to bed.  Apply heat:   Purpose: To treat any soreness and discomfort from the procedure.  When: Starting the next day after the procedure.  How: Apply heat to procedure site starting the day following the procedure.  How long: May continue to repeat daily, until discomfort goes away.  Food intake: Start with clear liquids (like water) and advance to regular food, as tolerated.   Physical activities: Keep activities to a minimum for the first 8 hours after the procedure. After that, then as tolerated.  Driving: If you have received any sedation, be responsible and do not drive. You are not allowed to drive for 24 hours after having sedation.  Blood thinner: (Applies only to those taking blood thinners) You may restart your blood thinner 6 hours after your procedure.  Insulin: (Applies only to Diabetic patients taking insulin) As soon as you can eat, you may resume your normal dosing schedule.  Infection prevention: Keep procedure site clean and dry. Shower daily and clean area with soap and water.  Post-procedure Pain Diary: Extremely important that this be done correctly and accurately. Recorded information will be used to determine the next step in treatment. For the purpose of accuracy, follow these rules:  Evaluate only the area treated. Do not report or include pain from an untreated area. For the purpose of this evaluation, ignore all other areas of pain,  except for the treated area.  After your procedure, avoid taking a long nap and attempting to complete the pain diary after you wake up. Instead, set your alarm clock to go off every hour, on the hour, for the initial 8 hours after the procedure. Document the duration of the numbing medicine, and the relief you are getting from it.  Do not go to sleep and attempt to complete it later. It will not be accurate. If you received sedation, it is likely that you were given a medication that may cause amnesia. Because of this, completing the diary at a later time may cause the information to be inaccurate. This information is needed to plan your care.  Follow-up appointment: Keep your post-procedure follow-up evaluation appointment after the procedure (usually 2 weeks for most procedures, 6 weeks for radiofrequencies). DO NOT FORGET to bring you pain diary with you.   Expect: (What should I expect to see with my procedure?)  From numbing medicine (AKA: Local Anesthetics): Numbness or decrease in pain. You may also experience some weakness, which if present, could last for the duration of the local anesthetic.  Onset: Full effect within 15 minutes of injected.  Duration: It will depend on the type of local anesthetic used. On the average, 1 to 8 hours.   From steroids (Applies only if steroids were used): Decrease in swelling or inflammation. Once inflammation is improved, relief of the pain will follow.  Onset of benefits: Depends on the amount of swelling present. The more swelling, the longer it will take for the benefits to be seen. In some cases, up to 10 days.    Duration: Steroids will stay in the system x 2 weeks. Duration of benefits will depend on multiple posibilities including persistent irritating factors.  Side-effects: If present, they may typically last 2 weeks (the duration of the steroids).  Frequent: Cramps (if they occur, drink Gatorade and take over-the-counter Magnesium 450-500 mg  once to twice a day); water retention with temporary weight gain; increases in blood sugar; decreased immune system response; increased appetite.  Occasional: Facial flushing (red, warm cheeks); mood swings; menstrual changes.  Uncommon: Long-term decrease or suppression of natural hormones; bone thinning. (These are more common with higher doses or more frequent use. This is why we prefer that our patients avoid having any injection therapies in other practices.)   Very Rare: Severe mood changes; psychosis; aseptic necrosis.  From procedure: Some discomfort is to be expected once the numbing medicine wears off. This should be minimal if ice and heat are applied as instructed.  Call if: (When should I call?)  You experience numbness and weakness that gets worse with time, as opposed to wearing off.  New onset bowel or bladder incontinence. (Applies only to procedures done in the spine)  Emergency Numbers:  Durning business hours (Monday - Thursday, 8:00 AM - 4:00 PM) (Friday, 9:00 AM - 12:00 Noon): [phone]  After hours: [phone]  NOTE: If you are having a problem and are unable connect with, or to talk to a provider, then go to your nearest urgent care or emergency department. If the problem is serious and urgent, please call 911. ____________________________________________________________________________________________   ____________________________________________________________________________________________  Blood Thinners  IMPORTANT NOTICE:  If you take any of these, make sure to notify the nursing staff.  Failure to do so may result in injury.  Recommended time intervals to stop and restart blood-thinners, before & after invasive procedures  Generic Name Brand Name Stop Time. Must be stopped at least this long before procedures. After procedures, wait at least this long before re-starting.  Abciximab Reopro 15 days 2 hrs  Alteplase Activase 10 days 10 days   Anagrelide Agrylin    Apixaban Eliquis 3 days 6 hrs  Cilostazol Pletal 3 days 5 hrs  Clopidogrel Plavix 7-10 days 2 hrs  Dabigatran Pradaxa 5 days 6 hrs  Dalteparin Fragmin 24 hours 4 hrs  Dipyridamole Aggrenox 11days 2 hrs  Edoxaban Lixiana; Savaysa 3 days 2 hrs  Enoxaparin  Lovenox 24 hours 4 hrs  Eptifibatide Integrillin 8 hours 2 hrs  Fondaparinux  Arixtra 72 hours 12 hrs  Hydroxychloroquine Plaquenil 11 days   Prasugrel Effient 7-10 days 6 hrs  Reteplase Retavase 10 days 10 days  Rivaroxaban Xarelto 3 days 6 hrs  Ticagrelor Brilinta 5-7 days 6 hrs  Ticlopidine Ticlid 10-14 days 2 hrs  Tinzaparin Innohep 24 hours 4 hrs  Tirofiban Aggrastat 8 hours 2 hrs  Warfarin Coumadin 5 days 2 hrs   Other medications with blood-thinning effects  Product indications Generic (Brand) names Note  Cholesterol Lipitor Stop 4 days before procedure  Blood thinner (injectable) Heparin (LMW or LMWH Heparin) Stop 24 hours before procedure  Cancer Ibrutinib (Imbruvica) Stop 7 days before procedure  Malaria/Rheumatoid Hydroxychloroquine (Plaquenil) Stop 11 days before procedure  Thrombolytics  10 days before or after procedures   Over-the-counter (OTC) Products with blood-thinning effects  Product Common names Stop Time  Aspirin > 325 mg Goody Powders, Excedrin, etc. 11 days  Aspirin ? 81 mg  7 days  Fish oil  4 days  Garlic supplements  7 days  Ginkgo  biloba  36 hours  Ginseng  24 hours  NSAIDs Ibuprofen, Naprosyn, etc. 3 days  Vitamin E  4 days   ____________________________________________________________________________________________

## 2021-05-26 ENCOUNTER — Telehealth: Payer: Self-pay

## 2021-05-26 NOTE — Telephone Encounter (Signed)
Post procedure phone call.  Patient states she is doing well.  

## 2021-05-30 ENCOUNTER — Telehealth: Payer: Self-pay | Admitting: Pharmacy Technician

## 2021-05-30 NOTE — Telephone Encounter (Signed)
[facility] Patient Advocate Encounter  Prior Authorization for TRULICITY has been approved.    PA# [id number] Effective dates: 05/29/2021 through 05/29/2022      [facility] will continue to follow.   Venida Jarvis. Nadara Mustard, CPhT Patient Advocate [facility] Phone: [phone] Fax:  [phone]

## 2021-06-03 ENCOUNTER — Other Ambulatory Visit: Payer: Self-pay | Admitting: Family Medicine

## 2021-06-03 DIAGNOSIS — E1169 Type 2 diabetes mellitus with other specified complication: Secondary | ICD-10-CM

## 2021-06-09 DIAGNOSIS — M25552 Pain in left hip: Secondary | ICD-10-CM | POA: Insufficient documentation

## 2021-06-09 DIAGNOSIS — G8929 Other chronic pain: Secondary | ICD-10-CM | POA: Insufficient documentation

## 2021-06-09 NOTE — Progress Notes (Signed)
Patient: Erika [name]  Service Category: E/M  Provider: Gaspar Cola, MD  DOB: 05-03-1971  DOS: 06/12/2021  Location: Office  MRN: [id number]  Setting: Ambulatory outpatient  Referring Provider: Lesleigh Noe, MD  Type: Established Patient  Specialty: Interventional Pain Management  PCP: Lesleigh Noe, MD  Location: Remote location  Delivery: TeleHealth     Virtual Encounter - Pain Management PROVIDER NOTE: Information contained herein reflects review and annotations entered in association with encounter. Interpretation of such information and data should be left to medically-trained personnel. Information provided to patient can be located elsewhere in the medical record under "Patient Instructions". Document created using STT-dictation technology, any transcriptional errors that may result from process are unintentional.    Contact & Pharmacy Preferred: [phone] Home: [phone] (home) Mobile: [phone] (mobile) E-mail: [email or web address]  [facility], [address] AT [address] [place] [address] Phone: [phone] Fax: [phone]  [id number] [name], [place] [address] Phone: [phone] Fax: [phone]   Pre-screening  Erika [name] offered "in-person" vs "virtual" encounter. She indicated preferring virtual for this encounter.   Reason COVID-19*  Social distancing based on CDC and AMA recommendations.   I contacted SHANYCE DARIS on 06/12/2021 via telephone.      I clearly identified myself as Gaspar Cola, MD. I verified that I was speaking with the correct person using two identifiers (Name: Erika [name], and date of birth: April 07, 1971).  Consent I sought verbal advanced consent from Erika [name] for virtual visit interactions. I informed Erika [name] of possible security and privacy  concerns, risks, and limitations associated with providing "not-in-person" medical evaluation and management services. I also informed Ms. Test of the availability of "in-person" appointments. Finally, I informed her that there would be a charge for the virtual visit and that she could be  personally, fully or partially, financially responsible for it. Erika [name] expressed understanding and agreed to proceed.   Historic Elements   Erika [name] is a 50 y.o. year old, female patient evaluated today after our last contact on 05/25/2021. Erika [name]  has a past medical history of Arthralgia of temporomandibular joint, CAD, multiple vessel, Carotid arterial disease ([place]), Clotting disorder ([place]), Depression, Diastolic dysfunction, Fatty liver disease, nonalcoholic (5462), History of blood transfusion, HLD (hyperlipidemia), Labile hypertension, Myocardial infarction ([place]) (2017), Obesity, PAD (peripheral artery disease) ([place]), PTSD (post-traumatic stress disorder), Tobacco abuse, and Type 2 diabetes mellitus [facility]) (January 2017). She also  has a past surgical history that includes Cholecystectomy; Tonsillectomy; Cesarean section; Cardiac catheterization ([name], 04/18/2016); Cardiac catheterization ([name], 06/29/2016); Coronary artery bypass graft ([name], 07/06/2016); TEE without cardioversion ([name], 07/06/2016); Endarterectomy (Right, 07/06/2016); Cardiac catheterization ([name], 08/29/2016); ABDOMINAL AORTOGRAM W/LOWER EXTREMITY ([name], 10/15/2018); PERIPHERAL VASCULAR INTERVENTION (Left, 10/15/2018); ABDOMINAL AORTOGRAM W/LOWER EXTREMITY (Bilateral, 08/19/2019); Endarterectomy (Right, 04/27/2020); and LEFT HEART CATH AND CORS/GRAFTS ANGIOGRAPHY ([name], 08/24/2020). Erika [name] has a current medication list which includes the following prescription(s): albuterol, amlodipine, benztropine, carvedilol, chlorpromazine, clonazepam, clonidine, clopidogrel, dapagliflozin propanediol, trulicity, fluoxetine hcl, basaglar kwikpen,  insulin lispro, insulin syringe-needle u-100, isosorbide mononitrate, lamotrigine, losartan, naloxone, nicotine, nitroglycerin, ondansetron, oxycodone, [START ON 06/23/2021] oxycodone, [START ON 07/23/2021] oxycodone, pregabalin, repatha sureclick, rosuvastatin, spironolactone, and torsemide. She  reports that she has been smoking cigarettes. She has a 13.50 pack-year smoking history. She has never used smokeless tobacco. She reports previous alcohol use. She reports that she does not use  drugs. Erika [name] is allergic to chantix [varenicline tartrate].   HPI  Today, she is being contacted for a post-procedure assessment.  Post-Procedure Evaluation  Procedure (05/25/2021): Therapeutic/palliative left IA hip joint injection #3 + left trochanteric bursa injection #1 under fluoroscopic guidance and IV sedation Pre-procedure pain level: 7/10 Post-procedure: 5/10 (< 50% relief)  Sedation: Sedation provided.  Effectiveness during initial hour after procedure(Ultra-Short Term Relief): 100 %.  Local anesthetic used: Long-acting (4-6 hours) Effectiveness: Defined as any analgesic benefit obtained secondary to the administration of local anesthetics. This carries significant diagnostic value as to the etiological location, or anatomical origin, of the pain. Duration of benefit is expected to coincide with the duration of the local anesthetic used.  Effectiveness during initial 4-6 hours after procedure(Short-Term Relief): 100 %.  Long-term benefit: Defined as any relief past the pharmacologic duration of the local anesthetics.  Effectiveness past the initial 6 hours after procedure(Long-Term Relief): 75 % (ongoing).  Current benefits: Defined as benefit that persist at this time.   Analgesia:   Patient indicates having an ongoing 75% relief of her pain in the area of the left hip. Function: Erika [name] reports improvement in function ROM: Erika [name] reports improvement in ROM  Pharmacotherapy  Assessment  Analgesic: Oxycodone IR 5 mg. 1 tab PO BID (10 mg/day of oxycodone) MME/day: 15 mg/day.   Monitoring: [place] PMP: PDMP reviewed during this encounter.       Pharmacotherapy: No side-effects or adverse reactions reported. Compliance: No problems identified. Effectiveness: Clinically acceptable. Plan: Refer to "POC".  UDS:  Summary  Date Value Ref Range Status  06/23/2020 Note  Corrected    Comment:    ==================================================================== [employer] Select 13 (MW) ==================================================================== Test                             Result       Flag       Units  Drug Present and Declared for Prescription Verification   Clonazepam                     28           EXPECTED   ng/mg creat   7-aminoclonazepam              303          EXPECTED   ng/mg creat    Source of clonazepam is a scheduled prescription medication. 7-    aminoclonazepam is an expected metabolite of clonazepam.  Drug Absent but Declared for Prescription Verification   Oxycodone                      Not Detected UNEXPECTED ng/mg creat ==================================================================== Test                      Result    Flag   Units      Ref Range   Creatinine              105              mg/dL      >=20 ==================================================================== Declared Medications:  The flagging and interpretation on this report are based on the  following declared medications.  Unexpected results may arise from  inaccuracies in the declared medications.   **Note: The testing scope of this panel includes these medications:   Clonazepam (Klonopin)  Oxycodone (Roxicodone)   **  Note: The testing scope of this panel does not include the  following reported medications:   Albuterol (Ventolin HFA)  Amlodipine (Norvasc)  Amoxicillin (Amoxil)  Aspirin  Benztropine (Cogentin)  Carvedilol (Coreg)   Chlorpromazine  Clonidine (Catapres)  Clopidogrel (Plavix)  Dapagliflozin (Farxiga)  Evolocumab  Fluoxetine  Insulin (Toujeo)  Isosorbide (Imdur)  Lamotrigine (Lamictal)  Losartan (Cozaar)  Mouthwash  Naloxone (Narcan)  Nicotine  Nitroglycerin (Nitrostat)  Ondansetron (Zofran)  Pregabalin (Lyrica)  Spironolactone (Aldactone)  Vitamin D3 ==================================================================== For clinical consultation, please call [phone]. ====================================================================     Laboratory Chemistry Profile   Renal Lab Results  Component Value Date   BUN 7 01/24/2021   CREATININE 0.77 01/24/2021   LABCREA 54.6 02/23/2021   BCR 18 01/12/2021   GFR 105.97 11/21/2020   GFRAA 106 01/12/2021   GFRNONAA >60 01/24/2021   LABVMA 1.3 02/23/2021   LABVMA 2.4 02/23/2021     Hepatic Lab Results  Component Value Date   AST 16 11/21/2020   ALT 36 (H) 11/21/2020   ALBUMIN 4.1 11/21/2020   ALKPHOS 96 11/21/2020   HCVAB <0.1 09/15/2019   LIPASE 34 05/28/2009     Electrolytes Lab Results  Component Value Date   NA 136 01/24/2021   K 4.4 01/24/2021   CL 96 (L) 01/24/2021   CALCIUM 10.1 01/24/2021   MG 2.0 01/07/2019   PHOS 3.1 05/19/2017     Bone Lab Results  Component Value Date   VD25OH 14.27 (L) 01/18/2020   25OHVITD1 16 (L) 01/07/2019   25OHVITD2 <1.0 01/07/2019   25OHVITD3 15 01/07/2019     Inflammation (CRP: Acute Phase) (ESR: Chronic Phase) Lab Results  Component Value Date   CRP 15 (H) 01/07/2019   ESRSEDRATE 17 03/17/2019   LATICACIDVEN 2.3 (HH) 05/19/2017       Note: Above Lab results reviewed.  Imaging  DG ELBOW COMPLETE LEFT (3+VIEW) CLINICAL DATA:  Chronic elbow pain secondary to fall. Fall on ice in February striking elbow. Pain since that time.  EXAM: LEFT ELBOW - COMPLETE 3+ VIEW  COMPARISON:  None.  FINDINGS: Normal joint spaces and alignment. No evidence of acute or  healed fracture. No joint effusion. There is no evidence of arthropathy or other focal bone abnormality. Soft tissue thickening and prominence overlies the olecranon bursa.  IMPRESSION: Soft tissue thickening and prominence overlying the olecranon bursa, suggesting bursitis. No fracture.  Electronically Signed   By: Narda Rutherford M.D.   On: 05/25/2021 15:12 DG [facility] C-ARM 1-60 MIN NO REPORT Fluoro was used, but no Radiologist interpretation will be provided.  Please refer to "NOTES" tab for provider progress note.  Assessment  The primary encounter diagnosis was Chronic pain syndrome. Diagnoses of History of carotid endarterectomy (Right), Chronic headaches (1ry area of Pain) (Right), Geniculate Neuralgia (Right), Atypical facial pain (Right), Chronic ear pain (Right), Chronic jaw pain (Right), Neuropathy, and Chronic hip pain (Bilateral) were also pertinent to this visit.  Plan of Care  Problem-specific:  No problem-specific Assessment & Plan notes found for this encounter.  Erika [name] has a current medication list which includes the following long-term medication(s): albuterol, amlodipine, benztropine, carvedilol, chlorpromazine, clonazepam, clonidine, fluoxetine hcl, basaglar kwikpen, insulin lispro, isosorbide mononitrate, lamotrigine, losartan, naloxone, nitroglycerin, oxycodone, [START ON 06/23/2021] oxycodone, [START ON 07/23/2021] oxycodone, pregabalin, rosuvastatin, spironolactone, and torsemide.  Pharmacotherapy (Medications Ordered): No orders of the defined types were placed in this encounter.  Orders:  No orders of the defined types were placed in this encounter.  Follow-up plan:   No follow-ups on file.     Interventional Therapies  Risk  Complexity Considerations:   Estimated body mass index is 31.01 kg/m as calculated from the following:   Height as of this encounter: $RemoveBeforeD'5\' 7"'[id number]$  (1.702 m).   Weight as of this encounter: 198 lb (89.8 kg). NOTE:  PLAVIX ANTICOAGULATION (Stop: 7-10 days pre-procedure; Restart: 2 hours post-proc.)   Planned  Pending:   Therapeutic left IA hip joint injection #3  Therapeutic left peri-insertional gluteus medius tendon injection #4  Diagnostic/therapeutic left elbow injection #1  Diagnostic/therapeutic left trochanteric bursa injection #1    Under consideration:   Possible repeat left hip MRI if pain continues or worsens.  Last MRI was done in August 2020. Diagnostic/therapeutic left elbow injection #1  Diagnostic right facial NB  Diagnostic right geniculate NB  Diagnostic right occipital NB   Diagnostic Botox injections   Completed:   Therapeutic left peri-insertional gluteus medius tendon injection x3 (02/23/2021) (100/100/100/100) Therapeutic left IA hip joint injection x2 (02/23/2021) (100/100/100/100) Therapeutic/palliative Facial (CN VII)/Geniculate NB x3 (03/05/2019) (100/100/0/0)   Therapeutic  Palliative (PRN) options:   Therapeutic left peri-insertional gluteus medius tendon injection #4   Therapeutic left IA hip joint injection #3  Therapeutic/palliative Facial (CN VII) NB #4       Recent Visits Date Type Provider Dept  05/25/21 Procedure visit Milinda Pointer, MD [facility]  05/10/21 Office Visit Milinda Pointer, MD [facility]  Showing recent visits within past 90 days and meeting all other requirements Today's Visits Date Type Provider Dept  06/12/21 Telemedicine Milinda Pointer, MD [facility]  Showing today's visits and meeting all other requirements Future Appointments Date Type Provider Dept  08/21/21 Appointment Milinda Pointer, MD [facility]  Showing future appointments within next 90 days and meeting all other requirements I discussed the assessment and treatment plan with the patient. The patient was provided an opportunity to ask questions and all were answered. The patient agreed with the plan and demonstrated an  understanding of the instructions.  Patient advised to call back or seek an in-person evaluation if the symptoms or condition worsens.  Duration of encounter: 16 minutes.  Note by: Gaspar Cola, MD Date: 06/12/2021; Time: 4:01 PM

## 2021-06-12 ENCOUNTER — Other Ambulatory Visit: Payer: Self-pay

## 2021-06-12 ENCOUNTER — Ambulatory Visit: Payer: Managed Care, Other (non HMO) | Attending: Pain Medicine | Admitting: Pain Medicine

## 2021-06-12 DIAGNOSIS — G8929 Other chronic pain: Secondary | ICD-10-CM

## 2021-06-12 DIAGNOSIS — G894 Chronic pain syndrome: Secondary | ICD-10-CM

## 2021-06-12 DIAGNOSIS — B0221 Postherpetic geniculate ganglionitis: Secondary | ICD-10-CM | POA: Diagnosis not present

## 2021-06-12 DIAGNOSIS — R519 Headache, unspecified: Secondary | ICD-10-CM | POA: Diagnosis not present

## 2021-06-12 DIAGNOSIS — Z9889 Other specified postprocedural states: Secondary | ICD-10-CM

## 2021-06-12 DIAGNOSIS — G501 Atypical facial pain: Secondary | ICD-10-CM

## 2021-06-12 DIAGNOSIS — M25552 Pain in left hip: Secondary | ICD-10-CM

## 2021-06-12 DIAGNOSIS — H9201 Otalgia, right ear: Secondary | ICD-10-CM

## 2021-06-12 DIAGNOSIS — M7022 Olecranon bursitis, left elbow: Secondary | ICD-10-CM | POA: Insufficient documentation

## 2021-06-12 DIAGNOSIS — G629 Polyneuropathy, unspecified: Secondary | ICD-10-CM

## 2021-06-12 DIAGNOSIS — R6884 Jaw pain: Secondary | ICD-10-CM

## 2021-06-12 DIAGNOSIS — M25551 Pain in right hip: Secondary | ICD-10-CM

## 2021-06-15 ENCOUNTER — Telehealth: Payer: Managed Care, Other (non HMO) | Admitting: Pain Medicine

## 2021-06-21 ENCOUNTER — Telehealth (HOSPITAL_COMMUNITY): Payer: Self-pay | Admitting: *Deleted

## 2021-06-21 ENCOUNTER — Other Ambulatory Visit: Payer: Self-pay | Admitting: Cardiovascular Disease

## 2021-06-21 DIAGNOSIS — F4310 Post-traumatic stress disorder, unspecified: Secondary | ICD-10-CM

## 2021-06-21 MED ORDER — CHLORPROMAZINE HCL 100 MG PO TABS: ORAL_TABLET | ORAL | 0 refills | Status: DC

## 2021-06-21 NOTE — Telephone Encounter (Signed)
Pt called requesting a refill of the Thorazine 100 mg HS, # 90 last filled 05/11/21. Pt states that she had a "muscle spasm" and the bottle feel in the toilet with the lid off. Pt denies any stiffness in neck or arms but describes more of a jerking movements that occur randomly. Pt is on Cogentin 0.5 mg qd. Please review.

## 2021-06-21 NOTE — Telephone Encounter (Signed)
We will provide a 30-day prescription only.  I am not sure if insurance will pay since she is early for the refills.  She can try going up on Cogentin 1 mg if jerky movements persistent otherwise may have to cut down the dose of Thorazine to 75 mg.

## 2021-06-21 NOTE — Addendum Note (Signed)
Addended by: Berniece Andreas T on: 06/21/2021 03:40 PM   Modules accepted: Orders

## 2021-06-21 NOTE — Telephone Encounter (Signed)
Writer informed pt that insurance may not cover. Will call with instructions. Thanks.

## 2021-06-21 NOTE — Telephone Encounter (Signed)
Writer spoke with pt to advise increasing Cogentin to ! Mg from 0.5 mg qd. Pt advised to call this office within the next week to update if muscle "spasms" or "jerks" decrease and if not we may to look at decreasing Thorazine to 75 mg. Pt verbalizes understanding.

## 2021-06-24 ENCOUNTER — Inpatient Hospital Stay
Admission: EM | Admit: 2021-06-24 | Discharge: 2021-06-28 | DRG: 064 | Disposition: A | Discharge status: Rehabilitation Facility | Payer: Managed Care, Other (non HMO) | Attending: Hospitalist | Admitting: Hospitalist

## 2021-06-24 ENCOUNTER — Inpatient Hospital Stay: Payer: Managed Care, Other (non HMO)

## 2021-06-24 ENCOUNTER — Emergency Department: Payer: Managed Care, Other (non HMO)

## 2021-06-24 ENCOUNTER — Encounter: Payer: Self-pay | Admitting: Internal Medicine

## 2021-06-24 DIAGNOSIS — Z79899 Other long term (current) drug therapy: Secondary | ICD-10-CM

## 2021-06-24 DIAGNOSIS — I6381 Other cerebral infarction due to occlusion or stenosis of small artery: Principal | ICD-10-CM | POA: Diagnosis present

## 2021-06-24 DIAGNOSIS — I110 Hypertensive heart disease with heart failure: Secondary | ICD-10-CM | POA: Diagnosis present

## 2021-06-24 DIAGNOSIS — J9601 Acute respiratory failure with hypoxia: Secondary | ICD-10-CM

## 2021-06-24 DIAGNOSIS — J189 Pneumonia, unspecified organism: Secondary | ICD-10-CM | POA: Diagnosis present

## 2021-06-24 DIAGNOSIS — Z20822 Contact with and (suspected) exposure to covid-19: Secondary | ICD-10-CM | POA: Diagnosis present

## 2021-06-24 DIAGNOSIS — E785 Hyperlipidemia, unspecified: Secondary | ICD-10-CM | POA: Diagnosis present

## 2021-06-24 DIAGNOSIS — I25118 Atherosclerotic heart disease of native coronary artery with other forms of angina pectoris: Secondary | ICD-10-CM | POA: Diagnosis not present

## 2021-06-24 DIAGNOSIS — I6350 Cerebral infarction due to unspecified occlusion or stenosis of unspecified cerebral artery: Secondary | ICD-10-CM | POA: Diagnosis not present

## 2021-06-24 DIAGNOSIS — A419 Sepsis, unspecified organism: Secondary | ICD-10-CM | POA: Diagnosis present

## 2021-06-24 DIAGNOSIS — I10 Essential (primary) hypertension: Secondary | ICD-10-CM | POA: Diagnosis present

## 2021-06-24 DIAGNOSIS — Z951 Presence of aortocoronary bypass graft: Secondary | ICD-10-CM | POA: Diagnosis not present

## 2021-06-24 DIAGNOSIS — E872 Acidosis: Secondary | ICD-10-CM | POA: Diagnosis present

## 2021-06-24 DIAGNOSIS — E1165 Type 2 diabetes mellitus with hyperglycemia: Secondary | ICD-10-CM | POA: Diagnosis present

## 2021-06-24 DIAGNOSIS — F418 Other specified anxiety disorders: Secondary | ICD-10-CM | POA: Diagnosis not present

## 2021-06-24 DIAGNOSIS — E669 Obesity, unspecified: Secondary | ICD-10-CM | POA: Diagnosis present

## 2021-06-24 DIAGNOSIS — I6389 Other cerebral infarction: Secondary | ICD-10-CM | POA: Diagnosis not present

## 2021-06-24 DIAGNOSIS — K760 Fatty (change of) liver, not elsewhere classified: Secondary | ICD-10-CM | POA: Diagnosis present

## 2021-06-24 DIAGNOSIS — D72829 Elevated white blood cell count, unspecified: Secondary | ICD-10-CM | POA: Diagnosis not present

## 2021-06-24 DIAGNOSIS — F4323 Adjustment disorder with mixed anxiety and depressed mood: Secondary | ICD-10-CM | POA: Diagnosis present

## 2021-06-24 DIAGNOSIS — I739 Peripheral vascular disease, unspecified: Secondary | ICD-10-CM | POA: Diagnosis present

## 2021-06-24 DIAGNOSIS — R7989 Other specified abnormal findings of blood chemistry: Secondary | ICD-10-CM | POA: Diagnosis present

## 2021-06-24 DIAGNOSIS — G8929 Other chronic pain: Secondary | ICD-10-CM

## 2021-06-24 DIAGNOSIS — M21372 Foot drop, left foot: Secondary | ICD-10-CM | POA: Diagnosis present

## 2021-06-24 DIAGNOSIS — R29810 Facial weakness: Secondary | ICD-10-CM | POA: Diagnosis present

## 2021-06-24 DIAGNOSIS — Z794 Long term (current) use of insulin: Secondary | ICD-10-CM | POA: Diagnosis not present

## 2021-06-24 DIAGNOSIS — I639 Cerebral infarction, unspecified: Secondary | ICD-10-CM

## 2021-06-24 DIAGNOSIS — E781 Pure hyperglyceridemia: Secondary | ICD-10-CM | POA: Diagnosis present

## 2021-06-24 DIAGNOSIS — G8194 Hemiplegia, unspecified affecting left nondominant side: Secondary | ICD-10-CM | POA: Diagnosis present

## 2021-06-24 DIAGNOSIS — Z720 Tobacco use: Secondary | ICD-10-CM | POA: Diagnosis not present

## 2021-06-24 DIAGNOSIS — I248 Other forms of acute ischemic heart disease: Secondary | ICD-10-CM | POA: Diagnosis present

## 2021-06-24 DIAGNOSIS — R739 Hyperglycemia, unspecified: Secondary | ICD-10-CM

## 2021-06-24 DIAGNOSIS — Z9889 Other specified postprocedural states: Secondary | ICD-10-CM | POA: Diagnosis not present

## 2021-06-24 DIAGNOSIS — Z811 Family history of alcohol abuse and dependence: Secondary | ICD-10-CM

## 2021-06-24 DIAGNOSIS — G44009 Cluster headache syndrome, unspecified, not intractable: Secondary | ICD-10-CM | POA: Diagnosis present

## 2021-06-24 DIAGNOSIS — F17210 Nicotine dependence, cigarettes, uncomplicated: Secondary | ICD-10-CM | POA: Diagnosis not present

## 2021-06-24 DIAGNOSIS — I5032 Chronic diastolic (congestive) heart failure: Secondary | ICD-10-CM | POA: Diagnosis present

## 2021-06-24 DIAGNOSIS — R778 Other specified abnormalities of plasma proteins: Secondary | ICD-10-CM | POA: Diagnosis not present

## 2021-06-24 DIAGNOSIS — I25110 Atherosclerotic heart disease of native coronary artery with unstable angina pectoris: Secondary | ICD-10-CM

## 2021-06-24 DIAGNOSIS — G8190 Hemiplegia, unspecified affecting unspecified side: Secondary | ICD-10-CM

## 2021-06-24 DIAGNOSIS — F4312 Post-traumatic stress disorder, chronic: Secondary | ICD-10-CM | POA: Diagnosis not present

## 2021-06-24 DIAGNOSIS — Z8249 Family history of ischemic heart disease and other diseases of the circulatory system: Secondary | ICD-10-CM

## 2021-06-24 DIAGNOSIS — Z7902 Long term (current) use of antithrombotics/antiplatelets: Secondary | ICD-10-CM

## 2021-06-24 DIAGNOSIS — G44029 Chronic cluster headache, not intractable: Secondary | ICD-10-CM | POA: Diagnosis not present

## 2021-06-24 DIAGNOSIS — E1169 Type 2 diabetes mellitus with other specified complication: Secondary | ICD-10-CM | POA: Diagnosis present

## 2021-06-24 DIAGNOSIS — I252 Old myocardial infarction: Secondary | ICD-10-CM

## 2021-06-24 DIAGNOSIS — R29704 NIHSS score 4: Secondary | ICD-10-CM | POA: Diagnosis present

## 2021-06-24 DIAGNOSIS — R6520 Severe sepsis without septic shock: Secondary | ICD-10-CM | POA: Diagnosis present

## 2021-06-24 DIAGNOSIS — E1151 Type 2 diabetes mellitus with diabetic peripheral angiopathy without gangrene: Secondary | ICD-10-CM | POA: Diagnosis present

## 2021-06-24 DIAGNOSIS — W19XXXA Unspecified fall, initial encounter: Secondary | ICD-10-CM | POA: Diagnosis present

## 2021-06-24 DIAGNOSIS — J9621 Acute and chronic respiratory failure with hypoxia: Secondary | ICD-10-CM | POA: Diagnosis present

## 2021-06-24 DIAGNOSIS — I2510 Atherosclerotic heart disease of native coronary artery without angina pectoris: Secondary | ICD-10-CM | POA: Diagnosis present

## 2021-06-24 DIAGNOSIS — R945 Abnormal results of liver function studies: Secondary | ICD-10-CM

## 2021-06-24 DIAGNOSIS — I69354 Hemiplegia and hemiparesis following cerebral infarction affecting left non-dominant side: Secondary | ICD-10-CM | POA: Diagnosis present

## 2021-06-24 DIAGNOSIS — J690 Pneumonitis due to inhalation of food and vomit: Secondary | ICD-10-CM | POA: Diagnosis not present

## 2021-06-24 DIAGNOSIS — F329 Major depressive disorder, single episode, unspecified: Secondary | ICD-10-CM | POA: Diagnosis present

## 2021-06-24 DIAGNOSIS — F17201 Nicotine dependence, unspecified, in remission: Secondary | ICD-10-CM | POA: Diagnosis present

## 2021-06-24 DIAGNOSIS — F4320 Adjustment disorder, unspecified: Secondary | ICD-10-CM | POA: Diagnosis present

## 2021-06-24 DIAGNOSIS — F4310 Post-traumatic stress disorder, unspecified: Secondary | ICD-10-CM | POA: Diagnosis present

## 2021-06-24 DIAGNOSIS — Z28310 Unvaccinated for covid-19: Secondary | ICD-10-CM

## 2021-06-24 DIAGNOSIS — R531 Weakness: Secondary | ICD-10-CM | POA: Diagnosis not present

## 2021-06-24 DIAGNOSIS — Z818 Family history of other mental and behavioral disorders: Secondary | ICD-10-CM | POA: Diagnosis not present

## 2021-06-24 HISTORY — DX: Acute respiratory failure with hypoxia: J96.01

## 2021-06-24 LAB — LACTIC ACID, PLASMA: Lactic Acid, Venous: 2.5 mmol/L (ref 0.5–1.9)

## 2021-06-24 LAB — CBC
HCT: 45.2 % (ref 36.0–46.0)
Hemoglobin: 15.4 g/dL — ABNORMAL HIGH (ref 12.0–15.0)
MCH: 30.4 pg (ref 26.0–34.0)
MCHC: 34.1 g/dL (ref 30.0–36.0)
MCV: 89.2 fL (ref 80.0–100.0)
Platelets: 326 10*3/uL (ref 150–400)
RBC: 5.07 MIL/uL (ref 3.87–5.11)
RDW: 13.9 % (ref 11.5–15.5)
WBC: 18.6 10*3/uL — ABNORMAL HIGH (ref 4.0–10.5)
nRBC: 0.1 % (ref 0.0–0.2)

## 2021-06-24 LAB — DIFFERENTIAL
Abs Immature Granulocytes: 0.09 10*3/uL — ABNORMAL HIGH (ref 0.00–0.07)
Basophils Absolute: 0.1 10*3/uL (ref 0.0–0.1)
Basophils Relative: 0 %
Eosinophils Absolute: 0.1 10*3/uL (ref 0.0–0.5)
Eosinophils Relative: 1 %
Immature Granulocytes: 1 %
Lymphocytes Relative: 10 %
Lymphs Abs: 1.9 10*3/uL (ref 0.7–4.0)
Monocytes Absolute: 0.7 10*3/uL (ref 0.1–1.0)
Monocytes Relative: 4 %
Neutro Abs: 15.7 10*3/uL — ABNORMAL HIGH (ref 1.7–7.7)
Neutrophils Relative %: 84 %

## 2021-06-24 LAB — URINALYSIS, ROUTINE W REFLEX MICROSCOPIC
Bilirubin Urine: NEGATIVE
Glucose, UA: >=500 mg/dL — AB
Hgb urine dipstick: NEGATIVE
Ketones, ur: 20 mg/dL — AB
Leukocytes,Ua: NEGATIVE
Nitrite: NEGATIVE
Protein, ur: 100 mg/dL — AB
Specific Gravity, Urine: 1.032 — ABNORMAL HIGH (ref 1.005–1.030)
pH: 5 (ref 5.0–8.0)

## 2021-06-24 LAB — CBG MONITORING, ED
Glucose-Capillary: 304 mg/dL — ABNORMAL HIGH (ref 70–99)
Glucose-Capillary: 202 mg/dL — ABNORMAL HIGH (ref 70–99)
Glucose-Capillary: 231 mg/dL — ABNORMAL HIGH (ref 70–99)

## 2021-06-24 LAB — COMPREHENSIVE METABOLIC PANEL
ALT: 68 U/L — ABNORMAL HIGH (ref 0–44)
AST: 63 U/L — ABNORMAL HIGH (ref 15–41)
Albumin: 3.8 g/dL (ref 3.5–5.0)
Alkaline Phosphatase: 85 U/L (ref 38–126)
Anion gap: 12 (ref 5–15)
BUN: 15 mg/dL (ref 6–20)
CO2: 25 mmol/L (ref 22–32)
Calcium: 9.9 mg/dL (ref 8.9–10.3)
Chloride: 98 mmol/L (ref 98–111)
Creatinine, Ser: 0.8 mg/dL (ref 0.44–1.00)
GFR, Estimated: >60 mL/min (ref >60)
Glucose, Bld: 306 mg/dL — ABNORMAL HIGH (ref 70–99)
Potassium: 4 mmol/L (ref 3.5–5.1)
Sodium: 135 mmol/L (ref 135–145)
Total Bilirubin: 0.8 mg/dL (ref 0.3–1.2)
Total Protein: 7.2 g/dL (ref 6.5–8.1)

## 2021-06-24 LAB — TROPONIN I (HIGH SENSITIVITY)
Troponin I (High Sensitivity): 106 ng/L (ref <18)
Troponin I (High Sensitivity): 172 ng/L (ref <18)
Troponin I (High Sensitivity): 250 ng/L (ref <18)

## 2021-06-24 LAB — URINE DRUG SCREEN, QUALITATIVE (ARMC ONLY)
Amphetamines, Ur Screen: NOT DETECTED
Barbiturates, Ur Screen: NOT DETECTED
Benzodiazepine, Ur Scrn: POSITIVE — AB
Cannabinoid 50 Ng, Ur ~~LOC~~: NOT DETECTED
Cocaine Metabolite,Ur ~~LOC~~: NOT DETECTED
MDMA (Ecstasy)Ur Screen: NOT DETECTED
Methadone Scn, Ur: NOT DETECTED
Opiate, Ur Screen: NOT DETECTED
Phencyclidine (PCP) Ur S: NOT DETECTED
Tricyclic, Ur Screen: POSITIVE — AB

## 2021-06-24 LAB — RESP PANEL BY RT-PCR (FLU A&B, COVID) ARPGX2
Influenza A by PCR: NEGATIVE
Influenza B by PCR: NEGATIVE
SARS Coronavirus 2 by RT PCR: NEGATIVE

## 2021-06-24 LAB — PROTIME-INR
INR: 1 (ref 0.8–1.2)
Prothrombin Time: 12.9 seconds (ref 11.4–15.2)

## 2021-06-24 LAB — HEPATITIS PANEL, ACUTE
HCV Ab: NONREACTIVE
Hep A IgM: NONREACTIVE
Hep B C IgM: NONREACTIVE
Hepatitis B Surface Ag: NONREACTIVE

## 2021-06-24 LAB — PREGNANCY, URINE: Preg Test, Ur: NEGATIVE

## 2021-06-24 LAB — BRAIN NATRIURETIC PEPTIDE: B Natriuretic Peptide: 33.3 pg/mL (ref 0.0–100.0)

## 2021-06-24 LAB — APTT: aPTT: 30 seconds (ref 24–36)

## 2021-06-24 LAB — ETHANOL: Alcohol, Ethyl (B): <10 mg/dL (ref <10)

## 2021-06-24 LAB — PROCALCITONIN: Procalcitonin: <0.1 ng/mL

## 2021-06-24 MED ORDER — ALBUTEROL SULFATE (2.5 MG/3ML) 0.083% IN NEBU: 3.0000 mL | INHALATION_SOLUTION | RESPIRATORY_TRACT | Status: DC | PRN

## 2021-06-24 MED ORDER — HYDRALAZINE HCL 20 MG/ML IJ SOLN: 5.0000 mg | INTRAMUSCULAR | Status: DC | PRN

## 2021-06-24 MED ORDER — BENZTROPINE MESYLATE 0.5 MG PO TABS
0.5000 mg | ORAL_TABLET | Freq: Every day | ORAL | Status: DC
  Administered 2021-06-24 – 2021-06-27 (×3): 0.5 mg via ORAL
  Filled 2021-06-24 (×6): qty 1

## 2021-06-24 MED ORDER — SODIUM CHLORIDE 0.9 % IV SOLN
100.0000 mg | Freq: Two times a day (BID) | INTRAVENOUS | Status: DC
  Administered 2021-06-24 – 2021-06-25 (×3): 100 mg via INTRAVENOUS
  Filled 2021-06-24 (×4): qty 100

## 2021-06-24 MED ORDER — CLONAZEPAM 0.5 MG PO TABS
0.5000 mg | ORAL_TABLET | Freq: Three times a day (TID) | ORAL | Status: DC | PRN
  Administered 2021-06-24: 0.5 mg via ORAL
  Filled 2021-06-24: qty 1

## 2021-06-24 MED ORDER — ONDANSETRON HCL 4 MG/2ML IJ SOLN: 4.0000 mg | Freq: Three times a day (TID) | INTRAMUSCULAR | Status: DC | PRN

## 2021-06-24 MED ORDER — ENOXAPARIN SODIUM 40 MG/0.4ML IJ SOSY
40.0000 mg | PREFILLED_SYRINGE | INTRAMUSCULAR | Status: DC
  Administered 2021-06-24 – 2021-06-27 (×4): 40 mg via SUBCUTANEOUS
  Filled 2021-06-24 (×4): qty 0.4

## 2021-06-24 MED ORDER — LAMOTRIGINE 100 MG PO TABS
200.0000 mg | ORAL_TABLET | Freq: Every day | ORAL | Status: DC
  Administered 2021-06-24 – 2021-06-27 (×4): 200 mg via ORAL
  Filled 2021-06-24 (×4): qty 2

## 2021-06-24 MED ORDER — SODIUM CHLORIDE 0.9 % IV BOLUS
1000.0000 mL | Freq: Once | INTRAVENOUS | Status: AC
  Administered 2021-06-24: 1000 mL via INTRAVENOUS

## 2021-06-24 MED ORDER — INSULIN ASPART 100 UNIT/ML IJ SOLN
0.0000 [IU] | Freq: Every day | INTRAMUSCULAR | Status: DC
  Administered 2021-06-24 – 2021-06-25 (×2): 2 [IU] via SUBCUTANEOUS
  Filled 2021-06-24 (×2): qty 1

## 2021-06-24 MED ORDER — SODIUM CHLORIDE 0.9% FLUSH
3.0000 mL | Freq: Once | INTRAVENOUS | Status: AC
Start: 2021-06-24 — End: 2021-06-24
  Administered 2021-06-24: 3 mL via INTRAVENOUS

## 2021-06-24 MED ORDER — INSULIN ASPART 100 UNIT/ML IJ SOLN
0.0000 [IU] | Freq: Three times a day (TID) | INTRAMUSCULAR | Status: DC
  Administered 2021-06-24 – 2021-06-25 (×2): 3 [IU] via SUBCUTANEOUS
  Administered 2021-06-26 (×3): 2 [IU] via SUBCUTANEOUS
  Administered 2021-06-27: 3 [IU] via SUBCUTANEOUS
  Administered 2021-06-27: 2 [IU] via SUBCUTANEOUS
  Filled 2021-06-24 (×7): qty 1

## 2021-06-24 MED ORDER — ISOSORBIDE MONONITRATE ER 60 MG PO TB24
30.0000 mg | ORAL_TABLET | Freq: Two times a day (BID) | ORAL | Status: DC
  Administered 2021-06-24 – 2021-06-27 (×6): 60 mg via ORAL
  Filled 2021-06-24 (×6): qty 1

## 2021-06-24 MED ORDER — TORSEMIDE 20 MG PO TABS
20.0000 mg | ORAL_TABLET | Freq: Two times a day (BID) | ORAL | Status: DC
  Filled 2021-06-24 (×2): qty 1

## 2021-06-24 MED ORDER — INSULIN ASPART 100 UNIT/ML IJ SOLN
8.0000 [IU] | Freq: Once | INTRAMUSCULAR | Status: AC
  Administered 2021-06-24: 8 [IU] via SUBCUTANEOUS
  Filled 2021-06-24: qty 1

## 2021-06-24 MED ORDER — CLOPIDOGREL BISULFATE 75 MG PO TABS
75.0000 mg | ORAL_TABLET | Freq: Every day | ORAL | Status: DC
  Administered 2021-06-25 – 2021-06-28 (×4): 75 mg via ORAL
  Filled 2021-06-24 (×4): qty 1

## 2021-06-24 MED ORDER — ROSUVASTATIN CALCIUM 10 MG PO TABS
20.0000 mg | ORAL_TABLET | Freq: Every day | ORAL | Status: DC
  Administered 2021-06-25 – 2021-06-28 (×4): 20 mg via ORAL
  Filled 2021-06-24: qty 1
  Filled 2021-06-24 (×3): qty 2
  Filled 2021-06-24: qty 1
  Filled 2021-06-24: qty 2

## 2021-06-24 MED ORDER — NITROGLYCERIN 0.4 MG SL SUBL: 0.4000 mg | SUBLINGUAL_TABLET | SUBLINGUAL | Status: DC | PRN

## 2021-06-24 MED ORDER — IOHEXOL 350 MG/ML SOLN
75.0000 mL | Freq: Once | INTRAVENOUS | Status: AC | PRN
  Administered 2021-06-24: 75 mL via INTRAVENOUS

## 2021-06-24 MED ORDER — OXYCODONE HCL 5 MG PO TABS
5.0000 mg | ORAL_TABLET | Freq: Four times a day (QID) | ORAL | Status: DC | PRN
  Administered 2021-06-24 – 2021-06-27 (×6): 5 mg via ORAL
  Filled 2021-06-24 (×6): qty 1

## 2021-06-24 MED ORDER — ASPIRIN 81 MG PO CHEW
81.0000 mg | CHEWABLE_TABLET | Freq: Every day | ORAL | Status: DC
  Administered 2021-06-24 – 2021-06-28 (×5): 81 mg via ORAL
  Filled 2021-06-24 (×4): qty 1

## 2021-06-24 MED ORDER — SODIUM CHLORIDE 0.9 % IV SOLN
1.0000 g | Freq: Every day | INTRAVENOUS | Status: DC
  Administered 2021-06-24: 1 g via INTRAVENOUS
  Filled 2021-06-24: qty 10

## 2021-06-24 MED ORDER — PREGABALIN 75 MG PO CAPS
225.0000 mg | ORAL_CAPSULE | Freq: Two times a day (BID) | ORAL | Status: DC
  Administered 2021-06-24 – 2021-06-28 (×9): 225 mg via ORAL
  Filled 2021-06-24 (×9): qty 3

## 2021-06-24 MED ORDER — FLUOXETINE HCL 20 MG PO CAPS
60.0000 mg | ORAL_CAPSULE | Freq: Every day | ORAL | Status: DC
  Administered 2021-06-25 – 2021-06-28 (×4): 60 mg via ORAL
  Filled 2021-06-24 (×4): qty 3

## 2021-06-24 MED ORDER — INSULIN GLARGINE 100 UNIT/ML ~~LOC~~ SOLN
45.0000 [IU] | Freq: Two times a day (BID) | SUBCUTANEOUS | Status: DC
  Administered 2021-06-24 – 2021-06-28 (×7): 45 [IU] via SUBCUTANEOUS
  Filled 2021-06-24 (×11): qty 0.45

## 2021-06-24 MED ORDER — NICOTINE 21 MG/24HR TD PT24
21.0000 mg | MEDICATED_PATCH | Freq: Every day | TRANSDERMAL | Status: DC
  Administered 2021-06-24 – 2021-06-28 (×5): 21 mg via TRANSDERMAL
  Filled 2021-06-24 (×5): qty 1

## 2021-06-24 MED ORDER — SPIRONOLACTONE 25 MG PO TABS
25.0000 mg | ORAL_TABLET | Freq: Every day | ORAL | Status: DC
  Filled 2021-06-24: qty 1

## 2021-06-24 MED ORDER — CHLORPROMAZINE HCL 100 MG PO TABS
100.0000 mg | ORAL_TABLET | Freq: Every day | ORAL | Status: DC
  Administered 2021-06-24 – 2021-06-26 (×2): 100 mg via ORAL
  Filled 2021-06-24 (×6): qty 1

## 2021-06-24 MED ORDER — IOHEXOL 350 MG/ML SOLN
100.0000 mL | Freq: Once | INTRAVENOUS | Status: AC | PRN
  Administered 2021-06-24: 100 mL via INTRAVENOUS

## 2021-06-24 MED ORDER — SODIUM CHLORIDE 0.9 % IV SOLN: INTRAVENOUS | Status: DC

## 2021-06-24 MED ORDER — STROKE: EARLY STAGES OF RECOVERY BOOK: Freq: Once | Status: AC

## 2021-06-24 MED ORDER — SENNOSIDES-DOCUSATE SODIUM 8.6-50 MG PO TABS: 1.0000 | ORAL_TABLET | Freq: Every evening | ORAL | Status: DC | PRN

## 2021-06-24 NOTE — Progress Notes (Signed)
   06/24/21 1150  Clinical Encounter Type  Visited With Patient and family together  Visit Type Follow-up;Code  Spiritual Encounters  Spiritual Needs Emotional;Other (Comment) (code stroke)  Chaplain Burris followed-up with Erika [name] and her partner. Chaplain responding to a second code stroke initiation, but there does not appear to be a second event. PT informed me she "will be okay" that she "is stubborn." Chaplain Burris provided compassionate, non-anxious presence and continued to offer availability for ongoing support as needed.

## 2021-06-24 NOTE — H&P (Addendum)
History and Physical    ABRI VACCA TML:[id number] DOB: November 14, 1971 DOA: 06/24/2021  Referring MD/NP/PA:   PCP: Lesleigh Noe, MD   Patient coming from:  The patient is coming from home.  At baseline, pt is independent for most of ADL.        Chief Complaint: Slurred speech, left facial droop, left arm weakness  HPI: Erika [name] is a 50 y.o. female with medical history significant of HTN, HLD, DM, tobacco abuse, PAD, CAD, CABG, dCHF, peptic foot drop, renal artery stenosis, carotid artery stenosis (s/p of right CEA 2017), PTSD, adjustment disorder, who presents with slurred speech, left facial droop, left arm weakness.  Pt states that she was walking to the bathroom in the middle night, and her legs gave away and fell.  No loss of consciousness.  No significant injury.  After that, patient had developed slurred speech, left facial droop and left arm weakness.  Patient has chronic left foot drop, no leg weakness or numbness.  Patient has some mild dry cough, denies chest pain or shortness of breath.  Patient was found to have oxygen desaturation to 89% by EMS and started 5 L oxygen with oxygen saturation 95%.  Patient does not have nausea, vomiting, diarrhea or abdominal pain.  No symptoms of UTI.  ED Course: pt was found to have WBC 18.6, INR 1.0, PTT 30, troponin level 106, 172, negative pregnancy test, negative COVID PCR, alcohol level less than 10, electrolytes renal function okay, liver function (ALP 85, AST 63, ALT 68, total bilirubin 0.8), temperature normal, blood pressure 138/86, heart rate 103, 87, RR 15.  Chest x-ray showed low volume and basilar atelectasis.  Patient is admitted to MedSurg bed by accepting MD as inpatient.  Dr. Rayann Heman of cardiology and Dr. Cheral Marker of neurology are consulted.  CT-head: 1. Subacute versus chronic appearing cortical infarcts in the right perirolandic and lateral right occipital regions are new since 2020. 2. No superimposed acute  cortically based infarct or acute intracranial hemorrhage identified. ASPECTS 10.  CTA of chest: 1. Suboptimal evaluation for pulmonary embolism. Pulmonary emboli cannot be confidently evaluated on this examination. 2. Patchy densities in the right upper lobe. Findings are nonspecific and could be related to areas of atelectasis versus mild infection. 3. Nodule in the superior segment of the right lower lobe has minimally changed since 2017 and likely benign. 4. Hepatic steatosis. 5.  Aortic Atherosclerosis (ICD10-I70.0).     Review of Systems:   General: no fevers, chills, no body weight gain, has fatigue HEENT: no blurry vision, hearing changes or sore throat Respiratory: no dyspnea, has coughing, no wheezing CV: no chest pain, no palpitations GI: no nausea, vomiting, abdominal pain, diarrhea, constipation GU: no dysuria, burning on urination, increased urinary frequency, hematuria  Ext: no leg edema Neuro: no vision change or hearing loss.  Has chronic left foot drop. Has left facial droop, slurred speech, left arm weakness Skin: no rash, no skin tear. MSK: No muscle spasm, no deformity, no limitation of range of movement in spin Heme: No easy bruising.  Travel history: No recent long distant travel.  Allergy:  Allergies  Allergen Reactions   Chantix [Varenicline Tartrate] Other (See Comments)    Feels "crazy" and angry     Past Medical History:  Diagnosis Date   Arthralgia of temporomandibular joint    CAD, multiple vessel    a. cath 06/29/16: ostLM 40%, ostLAD 40%, pLAD 95%, ost-pLCx 60%, pLCx 95%, mLCx 60%, mRCA 95%, D2  50%, LVSF nl;  b. 07/2016 CABG x 4 (LIMA->LAD, VG->Diag, VG->OM, VG->RCA); c. 08/2016 Cath: 3VD w/ 4/4 patent grafts. LAD distal to LIMA has diff dzs->Med rx.   Carotid arterial disease ([place])    a. 07/2016 s/p R CEA.   Clotting disorder ([place])    Depression    Diastolic dysfunction    a. echo 06/28/16: EF 50-55%, mild inf wall HK, GR1DD, mild MR, RV sys  fxn nl, mildly dilated LA, PASP nl   Fatty liver disease, nonalcoholic 3762   History of blood transfusion    with heart surgery   HLD (hyperlipidemia)    Labile hypertension    a. prior renal ngiogram negative for RAS in 03/2016; b. catecholamines and metanephrines normal, mildly elevated renin with normal aldosterone and normal ratio in 02/2016   Myocardial infarction [facility]) 2017   Obesity    PAD (peripheral artery disease) (HCC)    in both legs   PTSD (post-traumatic stress disorder)    Tobacco abuse    In 2018 - pt cut back from 2 ppd to 0.5 ppd.- still 1/2 ppd as of 04/22/20   Type 2 diabetes mellitus [facility]) January 2017    Past Surgical History:  Procedure Laterality Date   ABDOMINAL AORTOGRAM W/LOWER EXTREMITY [name] 10/15/2018   Procedure: ABDOMINAL AORTOGRAM W/LOWER EXTREMITY;  Surgeon: Wellington Hampshire, MD;  Location: [place] [facility];  Service: Cardiovascular;  Laterality: [name];   ABDOMINAL AORTOGRAM W/LOWER EXTREMITY Bilateral 08/19/2019   Procedure: ABDOMINAL AORTOGRAM W/LOWER EXTREMITY;  Surgeon: Wellington Hampshire, MD;  Location: [place] [facility];  Service: Cardiovascular;  Laterality: Bilateral;   CARDIAC CATHETERIZATION [name] 06/29/2016   Procedure: Left Heart Cath and Coronary Angiography;  Surgeon: Minna Merritts, MD;  Location: [place] [facility];  Service: Cardiovascular;  Laterality: [name];   CARDIAC CATHETERIZATION [name] 08/29/2016   Procedure: Left Heart Cath and Cors/Grafts Angiography;  Surgeon: Wellington Hampshire, MD;  Location: [place] [facility];  Service: Cardiovascular;  Laterality: [name];   CESAREAN SECTION     CHOLECYSTECTOMY     CORONARY ARTERY BYPASS GRAFT [name] 07/06/2016   Procedure: CORONARY ARTERY BYPASS GRAFTING (CABG) x four, using left internal mammary artery and right leg greater saphenous vein harvested endoscopically;  Surgeon: Ivin Poot, MD;  Location: [place];  Service: Open Heart Surgery;  Laterality: [name];   ENDARTERECTOMY Right 07/06/2016   Procedure:  ENDARTERECTOMY CAROTID;  Surgeon: Rosetta Posner, MD;  Location: [facility] OR;  Service: Vascular;  Laterality: Right;   ENDARTERECTOMY Right 04/27/2020   Procedure: REDO OF RIGHT ENDARTERECTOMY CAROTID;  Surgeon: Rosetta Posner, MD;  Location: [facility] OR;  Service: Vascular;  Laterality: Right;   LEFT HEART CATH AND CORS/GRAFTS ANGIOGRAPHY [name] 08/24/2020   Procedure: LEFT HEART CATH AND CORS/GRAFTS ANGIOGRAPHY;  Surgeon: Wellington Hampshire, MD;  Location: [place] [facility];  Service: Cardiovascular;  Laterality: [name];   PERIPHERAL VASCULAR CATHETERIZATION [name] 04/18/2016   Procedure: Renal Angiography;  Surgeon: Wellington Hampshire, MD;  Location: [place] [facility];  Service: Cardiovascular;  Laterality: [name];   PERIPHERAL VASCULAR INTERVENTION Left 10/15/2018   Procedure: PERIPHERAL VASCULAR INTERVENTION;  Surgeon: Wellington Hampshire, MD;  Location: [place] [facility];  Service: Cardiovascular;  Laterality: Left;  Left superficial femoral   TEE WITHOUT CARDIOVERSION [name] 07/06/2016   Procedure: TRANSESOPHAGEAL ECHOCARDIOGRAM (TEE);  Surgeon: Ivin Poot, MD;  Location: [place];  Service: Open Heart Surgery;  Laterality: [name];   TONSILLECTOMY  Social History:  reports that she has been smoking cigarettes. She has a 13.50 pack-year smoking history. She has never used smokeless tobacco. She reports previous alcohol use. She reports that she does not use drugs.  Family History:  Family History  Adopted: Yes  Problem Relation Age of Onset   Diabetes Mother    Diabetes Father    Alcohol abuse Father    Heart disease Father    Drug abuse Father    Stroke Sister    Anxiety disorder Sister      Prior to Admission medications   Medication Sig Start Date End Date Taking? Authorizing Provider  albuterol (PROVENTIL HFA;VENTOLIN HFA) 108 (90 Base) MCG/ACT inhaler Inhale 2 puffs into the lungs every 6 (six) hours as needed for wheezing. 03/02/19 11/21/22 Yes Lucille Passy, MD  amLODipine (NORVASC) 10 MG tablet Take 1  tablet (10 mg total) by mouth daily. 03/20/21 06/24/21 Yes Lesleigh Noe, MD  benztropine (COGENTIN) 0.5 MG tablet Take 1 tablet (0.5 mg total) by mouth at bedtime. 05/11/21 05/11/22 Yes Arfeen, Arlyce Harman, MD  carvedilol (COREG) 25 MG tablet Take 1.5 tablets (37.5 mg total) by mouth 2 (two) times daily with a meal. 01/12/21  Yes Visser, Jacquelyn D, PA-C  chlorproMAZINE (THORAZINE) 100 MG tablet TAKE 1 TABLET BY MOUTH EVERYDAY AT BEDTIME 06/21/21  Yes Arfeen, Arlyce Harman, MD  clonazePAM (KLONOPIN) 0.5 MG tablet Take 1 tablet (0.5 mg total) by mouth 3 (three) times daily as needed for anxiety. 05/11/21  Yes Arfeen, Arlyce Harman, MD  cloNIDine (CATAPRES) 0.1 MG tablet TAKE ONE TABLET BY MOUTH TWICE A DAY 06/21/21  Yes Wellington Hampshire, MD  clopidogrel (PLAVIX) 75 MG tablet TAKE 1 TABLET BY MOUTH EVERY DAY 02/11/20  Yes Wellington Hampshire, MD  dapagliflozin propanediol (FARXIGA) 10 MG TABS tablet Take 1 tablet (10 mg total) by mouth daily. Pt needs apt with PCP for further refills 06/06/21  Yes Lesleigh Noe, MD  Dulaglutide (TRULICITY) 1.61 WR/6.0AV SOPN Inject 0.75 mg into the skin once a week. 03/21/21  Yes Renato Shin, MD  FLUoxetine HCl 60 MG TABS Take 60 mg by mouth daily. 05/11/21  Yes Arfeen, Arlyce Harman, MD  Insulin Glargine (BASAGLAR KWIKPEN) 100 UNIT/ML Inject 120 Units into the skin every morning. And pen needles 2/day 03/21/21  Yes Renato Shin, MD  isosorbide mononitrate (IMDUR) 60 MG 24 hr tablet TAKE ONE TABLET BY MOUTH EVERY MORNING AND ONE-HALF EVERY EVENING 06/21/21  Yes Wellington Hampshire, MD  lamoTRIgine (LAMICTAL) 200 MG tablet Take 1 tablet (200 mg total) by mouth at bedtime. 05/11/21  Yes Arfeen, Arlyce Harman, MD  losartan (COZAAR) 100 MG tablet TAKE ONE TABLET BY MOUTH ONE TIME DAILY 03/15/21  Yes Wellington Hampshire, MD  naloxone [facility]) nasal spray 4 mg/0.1 mL Place 1 spray into the nose as needed for up to 365 doses (for opioid-induced respiratory depresssion). In case of emergency (overdose), spray once into each  nostril. If no response within 3 minutes, repeat application and call [phone]. 05/10/21 05/10/22 Yes Milinda Pointer, MD  nicotine (NICODERM CQ - DOSED IN MG/24 HOURS) 14 mg/24hr patch Place 14 mg onto the skin daily.  07/30/20  Yes [provider]  nitroGLYCERIN (NITROSTAT) 0.4 MG SL tablet Place 1 tablet (0.4 mg total) under the tongue every 5 (five) minutes as needed for chest pain. 03/15/21  Yes Wellington Hampshire, MD  ondansetron (ZOFRAN) 4 MG tablet Take 1 tablet (4 mg total) by mouth daily as  needed for nausea or vomiting. 03/22/21  Yes Lesleigh Noe, MD  oxyCODONE (OXY IR/ROXICODONE) 5 MG immediate release tablet Take 1 tablet (5 mg total) by mouth 2 (two) times daily as needed for severe pain. Must last 30 days. 06/23/21 07/23/21 Yes Milinda Pointer, MD  pregabalin (LYRICA) 225 MG capsule Take 1 capsule (225 mg total) by mouth 2 (two) times daily. 05/03/21  Yes Lesleigh Noe, MD  REPATHA SURECLICK 161 MG/ML SOAJ INJECT 1 PEN INTO THE SKIN EVERY 14 (FOURTEEN) DAYS. 12/08/20  Yes Wellington Hampshire, MD  rosuvastatin (CRESTOR) 20 MG tablet Take 1 tablet (20 mg total) by mouth daily. 02/09/21 06/24/21 Yes Wellington Hampshire, MD  spironolactone (ALDACTONE) 25 MG tablet Take 1 tablet (25 mg total) by mouth daily. 01/06/21  Yes Wellington Hampshire, MD  torsemide (DEMADEX) 20 MG tablet Take 1 tablet (20 mg total) by mouth 2 (two) times daily. 01/12/21 06/24/21 Yes Visser, Jacquelyn D, PA-C  insulin lispro (HUMALOG KWIKPEN) 100 UNIT/ML KwikPen Inject 3 Units into the skin 3 (three) times daily with meals. Take only if blood sugar is over 250.  Also pen needles 4/day Patient not taking: No sig reported 09/23/20   Libby Maw, MD  Insulin Syringe-Needle U-100 29G X 1/2" 0.3 ML MISC 1 each by Does not apply route 2 (two) times daily. 05/20/17   Nita Sells, MD  oxyCODONE (OXY IR/ROXICODONE) 5 MG immediate release tablet Take 1 tablet (5 mg total) by mouth 2 (two) times daily as needed for severe  pain. Must last 30 days. 05/24/21 06/23/21  Milinda Pointer, MD  oxyCODONE (OXY IR/ROXICODONE) 5 MG immediate release tablet Take 1 tablet (5 mg total) by mouth 2 (two) times daily as needed for severe pain. Must last 30 days. 07/23/21 08/22/21  Milinda Pointer, MD    Physical Exam: Vitals:   06/24/21 0603 06/24/21 0700 06/24/21 0730 06/24/21 1108  BP: 123/90 (!) 151/92 138/86 133/79  Pulse: (!) 103 97  100  Resp: 20 14 15 17   Temp: 98.8 F (37.1 C)   98.5 F (36.9 C)  TempSrc: Oral   Oral  SpO2: 95% 95%  90%  Weight:      Height:       General: Not in acute distress HEENT:       Eyes: PERRL, EOMI, no scleral icterus.       ENT: No discharge from the ears and nose, no pharynx injection, no tonsillar enlargement.        Neck: No JVD, no bruit, no mass felt. Heme: No neck lymph node enlargement. Cardiac: S1/S2, RRR, No murmurs, No gallops or rubs. Respiratory: No rales, wheezing, rhonchi or rubs. GI: Soft, nondistended, nontender, no rebound pain, no organomegaly, BS present. GU: No hematuria Ext: No pitting leg edema bilaterally. 1+DP/PT pulse bilaterally. Musculoskeletal: No joint deformities, No joint redness or warmth, no limitation of ROM in spin. Skin: No rashes.  Neuro: Alert, oriented X3, cranial nerves II-XII grossly intact, moves all extremities normally. Muscle strength 1/5 in in left arm and 5/5 in other extremities, sensation to light touch intact. Has left foot drop.  Brachial reflex 2+ bilaterally. Knee reflex 1+ bilaterally. Negative Babinski's sign. Normal finger to nose test. Psych: Patient is not psychotic, no suicidal or hemocidal ideation.  Labs on Admission: I have personally reviewed following labs and imaging studies  CBC: Recent Labs  Lab 06/24/21 0540  WBC 18.6*  NEUTROABS 15.7*  HGB 15.4*  HCT 45.2  MCV 89.2  PLT 678   Basic Metabolic Panel: Recent Labs  Lab 06/24/21 0540  NA 135  K 4.0  CL 98  CO2 25  GLUCOSE 306*  BUN 15   CREATININE 0.80  CALCIUM 9.9   GFR: Estimated Creatinine Clearance: 97 mL/min (by C-G formula based on SCr of 0.8 mg/dL). Liver Function Tests: Recent Labs  Lab 06/24/21 0540  AST 63*  ALT 68*  ALKPHOS 85  BILITOT 0.8  PROT 7.2  ALBUMIN 3.8   No results for input(s): LIPASE, AMYLASE in the last 168 hours. No results for input(s): AMMONIA in the last 168 hours. Coagulation Profile: Recent Labs  Lab 06/24/21 0540  INR 1.0   Cardiac Enzymes: No results for input(s): CKTOTAL, CKMB, CKMBINDEX, TROPONINI in the last 168 hours. BNP (last 3 results) No results for input(s): PROBNP in the last 8760 hours. HbA1C: No results for input(s): HGBA1C in the last 72 hours. CBG: Recent Labs  Lab 06/24/21 0552  GLUCAP 304*   Lipid Profile: No results for input(s): CHOL, HDL, LDLCALC, TRIG, CHOLHDL, LDLDIRECT in the last 72 hours. Thyroid Function Tests: No results for input(s): TSH, T4TOTAL, FREET4, T3FREE, THYROIDAB in the last 72 hours. Anemia Panel: No results for input(s): VITAMINB12, FOLATE, FERRITIN, TIBC, IRON, RETICCTPCT in the last 72 hours. Urine analysis:    Component Value Date/Time   COLORURINE YELLOW (A) 06/24/2021 0833   APPEARANCEUR HAZY (A) 06/24/2021 0833   APPEARANCEUR Hazy 01/11/2015 1022   LABSPEC 1.032 (H) 06/24/2021 0833   LABSPEC 1.024 01/11/2015 1022   PHURINE 5.0 06/24/2021 0833   GLUCOSEU >=500 (A) 06/24/2021 0833   GLUCOSEU >=1000 (A) 10/19/2019 1034   HGBUR NEGATIVE 06/24/2021 0833   BILIRUBINUR NEGATIVE 06/24/2021 0833   BILIRUBINUR Negative 10/19/2019 1049   BILIRUBINUR Negative 01/11/2015 1022   KETONESUR 20 (A) 06/24/2021 0833   PROTEINUR 100 (A) 06/24/2021 0833   UROBILINOGEN 1.0 10/19/2019 1049   UROBILINOGEN 1.0 10/19/2019 1034   NITRITE NEGATIVE 06/24/2021 0833   LEUKOCYTESUR NEGATIVE 06/24/2021 0833   LEUKOCYTESUR Negative 01/11/2015 1022   Sepsis Labs: @LABRCNTIP (procalcitonin:4,lacticidven:4) ) Recent Results (from the past  240 hour(s))  Resp Panel by RT-PCR (Flu A&B, Covid) Nasopharyngeal Swab     Status: None   Collection Time: 06/24/21  6:30 AM   Specimen: Nasopharyngeal Swab; Nasopharyngeal(NP) swabs in vial transport medium  Result Value Ref Range Status   SARS Coronavirus 2 by RT PCR NEGATIVE NEGATIVE Final    Comment: (NOTE) SARS-CoV-2 target nucleic acids are NOT DETECTED.  The SARS-CoV-2 RNA is generally detectable in upper respiratory specimens during the acute phase of infection. The lowest concentration of SARS-CoV-2 viral copies this assay can detect is 138 copies/mL. A negative result does not preclude SARS-Cov-2 infection and should not be used as the sole basis for treatment or other patient management decisions. A negative result may occur with  improper specimen collection/handling, submission of specimen other than nasopharyngeal swab, presence of viral mutation(s) within the areas targeted by this assay, and inadequate number of viral copies(<138 copies/mL). A negative result must be combined with clinical observations, patient history, and epidemiological information. The expected result is Negative.  Fact Sheet for Patients:  EntrepreneurPulse.com.au  Fact Sheet for Healthcare Providers:  IncredibleEmployment.be  This test is no t yet approved or cleared by the [place] FDA and  has been authorized for detection and/or diagnosis of SARS-CoV-2 by FDA under an Emergency Use Authorization (EUA). This EUA will remain  in effect (meaning this test can be used) for  the duration of the COVID-19 declaration under Section 564(b)(1) of the Act, 21 U.S.C.section 360bbb-3(b)(1), unless the authorization is terminated  or revoked sooner.       Influenza A by PCR NEGATIVE NEGATIVE Final   Influenza B by PCR NEGATIVE NEGATIVE Final    Comment: (NOTE) The Xpert Xpress SARS-CoV-2/FLU/RSV plus assay is intended as an aid in the diagnosis of influenza  from Nasopharyngeal swab specimens and should not be used as a sole basis for treatment. Nasal washings and aspirates are unacceptable for Xpert Xpress SARS-CoV-2/FLU/RSV testing.  Fact Sheet for Patients: EntrepreneurPulse.com.au  Fact Sheet for Healthcare Providers: IncredibleEmployment.be  This test is not yet approved or cleared by the [place] FDA and has been authorized for detection and/or diagnosis of SARS-CoV-2 by FDA under an Emergency Use Authorization (EUA). This EUA will remain in effect (meaning this test can be used) for the duration of the COVID-19 declaration under Section 564(b)(1) of the Act, 21 U.S.C. section 360bbb-3(b)(1), unless the authorization is terminated or revoked.  Performed at [facility], [address] [place],  [address]      Radiological Exams on Admission: CT Angio Chest Pulmonary Embolism (PE) W or WO Contrast  Result Date: 06/24/2021 CLINICAL DATA:  Evaluate for pulmonary embolism. History of code stroke. EXAM: CT ANGIOGRAPHY CHEST WITH CONTRAST TECHNIQUE: Multidetector CT imaging of the chest was performed using the standard protocol during bolus administration of intravenous contrast. Multiplanar CT image reconstructions and MIPs were obtained to evaluate the vascular anatomy. CONTRAST:  54mL OMNIPAQUE IOHEXOL 350 MG/ML SOLN COMPARISON:  Chest CT 06/28/2016 FINDINGS: Cardiovascular: Suboptimal evaluation of the pulmonary arteries. Large amount of contrast is already in the aorta. Bilateral main pulmonary arteries are patent but cannot evaluate beyond the main pulmonary arteries. Atherosclerotic disease in the thoracic aorta, post CABG. Calcifications in the native coronary arteries. Great vessels are patent. Atherosclerotic plaque involving the right common carotid artery and innominate artery. Normal caliber of the thoracic aorta. Mediastinum/Nodes: Postsurgical changes from median  sternotomy and CABG. No significant lymph node enlargement. Lungs/Pleura: Trachea and mainstem bronchi are patent. No large pleural effusions. Patchy densities in the right upper lobe. 5 mm nodule in the superior segment of the right lower lobe has minimally changed since 2017 and likely benign. zPeripheral densities in the posterior right lower lobe are most compatible with atelectasis. Peripheral nodule in the posterior left lower lobe on sequence 6 image 49 appears stable. Upper Abdomen: Diffuse low density of the liver is compatible with steatosis. Musculoskeletal: No acute bone abnormality. Review of the MIP images confirms the above findings. IMPRESSION: 1. Suboptimal evaluation for pulmonary embolism. Pulmonary emboli cannot be confidently evaluated on this examination. 2. Patchy densities in the right upper lobe. Findings are nonspecific and could be related to areas of atelectasis versus mild infection. 3. Nodule in the superior segment of the right lower lobe has minimally changed since 2017 and likely benign. 4. Hepatic steatosis. 5.  Aortic Atherosclerosis (ICD10-I70.0). Electronically Signed   By: Markus Daft M.D.   On: 06/24/2021 10:55   DG Chest Port 1 View  Result Date: 06/24/2021 CLINICAL DATA:  Code stroke. EXAM: PORTABLE CHEST 1 VIEW COMPARISON:  May 25 21 FINDINGS: Is 631 hours. Low lung volumes. Basilar atelectasis without focal consolidation or overt pulmonary edema. Interstitial markings are diffusely coarsened with chronic features. The cardiopericardial silhouette is within normal limits for size. Status post CABG. Telemetry leads overlie the chest. IMPRESSION: Low volume chest with basilar atelectasis. Electronically Signed  By: Misty Stanley M.D.   On: 06/24/2021 07:28   CT HEAD CODE STROKE WO CONTRAST  Addendum Date: 06/24/2021   ADDENDUM REPORT: 06/24/2021 06:27 ADDENDUM: Study discussed by telephone with Dr. Luvenia Starch SUNG on 06/24/2021 at 0619 hours. Electronically Signed   By: Genevie Ann  M.D.   On: 06/24/2021 06:27   Result Date: 06/24/2021 CLINICAL DATA:  Code stroke. 50 year old female with left side weakness. Last known well midnight. History of right carotid endarterectomy. EXAM: CT HEAD WITHOUT CONTRAST TECHNIQUE: Contiguous axial images were obtained from the base of the skull through the vertex without intravenous contrast. COMPARISON:  Brain MRI 12/03/2012.  Head CT 02/25/2019. FINDINGS: Brain: Abnormal cortical and subcortical white matter hypodensity in both the superior right perirolandic cortex (series 3, image 23) and lateral right occipital pole (image 17) are new since 2020, but appears subacute versus chronic. Possible petechial hemorrhage associated with the latter. No superimposed acute cortically based infarct identified. Elsewhere gray-white matter differentiation is within normal limits. No midline shift, ventriculomegaly, mass effect, evidence of mass lesion, or other intracranial hemorrhage. Vascular: Calcified atherosclerosis at the skull base. No suspicious intracranial vascular hyperdensity. Skull: Negative. Sinuses/Orbits: Visualized paranasal sinuses and mastoids are clear. Other: Visualized orbits and scalp soft tissues are within normal limits. ASPECTS [facility] Stroke Program Early CT Score) Total score (0-10 with 10 being normal): 10 (suspected subacute or chronic cortical infarcts in the right hemisphere). IMPRESSION: 1. Subacute versus chronic appearing cortical infarcts in the right perirolandic and lateral right occipital regions are new since 2020. 2. No superimposed acute cortically based infarct or acute intracranial hemorrhage identified. ASPECTS 10. Electronically Signed: By: Genevie Ann M.D. On: 06/24/2021 06:07     EKG: I have personally reviewed.  Sinus rhythm, QTC 463, ST elevation in V1-V3 (similar to previous EKG on 09/06/2020), ST depression in ED with 4V-V6, and a lateral leads.  Assessment/Plan Principal Problem:   Stroke [facility] Active Problems:    HLD (hyperlipidemia)   ADJUSTMENT DISORDER WITH MIXED FEATURES   MDD (major depressive disorder)   Elevated troponin   Tobacco abuse   Essential hypertension   Type 2 diabetes mellitus with other specified complication (HCC)   CAD (coronary artery disease)   Sepsis (HCC)   PVD (peripheral vascular disease) (HCC)   Chronic diastolic CHF (congestive heart failure) (HCC)   Fall   Abnormal LFTs   Acute respiratory failure with hypoxia ([place])   Stroke [facility]): Patient's symptoms are consistent with sinus stroke.  CT scan showed subacute versus chronic appearing cortical infarcts in the right perirolandic and lateral right occipital regions are new since 2020. Dr.Lindzen of neuro is consulted.  - Admit to MedSurg bed as inpatient - Obtain MRI of brain - will hold oral Bp meds to allow permissive HTN in the setting of acute stroke, for SBP>220 or dBP>110 - Continue Plavix  - fasting lipid panel and HbA1c  - 2D transthoracic echocardiography  - swallowing screen. If fails, will get SLP - Check UDS  - PT/OT consult  HLD (hyperlipidemia) -Crestor -pt is on Repatha q14days, last dose on 6/26  ADJUSTMENT DISORDER WITH MIXED FEATURES and MDD (major depressive disorder): -Cogentin, Thorazine, Klonopin, Lamictal  Elevated troponin and hx of CAD: s/p of CABG. patient does not have chest pain, likely due to demand ischemia, but her troponins are trending up from 106 --> 172. Dr. Rayann Heman and NP Murray Hodgkins are consulted. - Trend Trop - Repeat EKG in the am  - Crestor, plavix - Risk  factor stratification: will check FLP and A1C  - check UDS - 2d echo  Tobacco abuse -Nicotine patch  Essential hypertension: -IV hydralazine as needed for SBP >220, or DBP >120 -Hold amlodipine, Coreg, clonidine, Cozaar to allow permissive hypertension  Type 2 diabetes mellitus with other specified complication [facility]): Recent A1c 11.5, poorly controlled.  Patient is taking Trulicity, Farxiga, glargine  insulin -Sliding scale insulin -Decrease glargine dose from 120 to 90 unit daily  PVD (peripheral vascular disease) (HCC) -On Crestor and Plavix  Chronic diastolic CHF (congestive heart failure) ([place]): 2D echo on 06/27/2016 showed EF of 50-55% with grade 1 diastolic dysfunction.  Patient does not have leg edema.  No pulmonary edema on chest x-ray.  BNP 33.3, CHF seem to be compensated. -hold torsemide and spironolactone  Fall -PT/OT  Abnormal LFTs: mild -hepatis panel -Avoid using Tylenol  Acute respiratory failure with hypoxia and sepsis: Etiology is not clear.  CT angiogram is asuboptimal evaluation for pulmonary embolism. Pulmonary emboli cannot be confidently evaluated on this examination, but it showed patchy densities in the right upper lobe, indicating possible pneumonia. Pt meets criteria for sepsis with WBC 18.6, tachycardia with heart rate 103.  Will start antibiotics empirically -Rocephin and doxycycline -Follow-up of blood culture and sputum culture -urine antigen of Legionella and strep -will get Procalcitonin and trend lactic acid levels per sepsis protocol. -if lactic acid is elevated, will hold diuretics and start IV fluid.  Addendum: Lactic acid is elevated 2.5, now pt meets criteria for severe sepsis -will hold diuretics -IVF: 1L NS and then 75 cc/h (patient has congestive heart failure, limiting aggressive IV fluids treatment).    DVT ppx: Q Lovenox Code Status: Full code Family Communication:  Yes, patient's  husband  at bed side Disposition Plan:  Anticipate discharge back to previous environment Consults called:  Dr. Rayann Heman of cardiology and Dr. Cheral Marker of neurology are consulted. Admission status and Level of care: Med-Surg:     as inpt       Status is: Inpatient  Remains inpatient appropriate because:Inpatient level of care appropriate due to severity of illness  Dispo: The patient is from: Home              Anticipated d/c is to: Home               Patient currently is not medically stable to d/c.   Difficult to place patient No          Date of Service 06/24/2021    Ivor Costa [facility]   If 7PM-7AM, please contact night-coverage [email or web address] 06/24/2021, 11:33 AM

## 2021-06-24 NOTE — Evaluation (Signed)
Occupational Therapy Evaluation Patient Details Name: Erika [name] MRN: [id number] DOB: 10/11/1971 Today's Date: 06/24/2021    History of Present Illness 50 y.o. female with a PMHx of multi-vessel CAD s/p CABG, carotid arterial disease s/p right CEA x 2, diastolic dysfunction, nonalcoholic fatty liver disease, HLD, labile hypertension, obesity, PAD bilateral lower extremities, PTSD, tobacco abuse and DM2 who presented to the hospital early this AM with acute onset of left sided weakness. CT head showed no hemorrhage, but two cortically based ischemic infarctions of indeterminate age were seen in the right cerebral hemisphere. MRI pending.   Clinical Impression   Pt seen for OT evaluation this date. 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 Spouse present for session and very supportive. Pt alert and oriented, motivated throughout session, and states her goal is to get better as quickly as possible for her daughter. Pt received seated EOB at start of session.  Pt lives with her spouse and daughter and 4 dogs in a one-level home with 4 steps to enter and R (secure) and L (loose) hand rails, walk-in shower. Currently pt demonstrates significant impairments in LUE/LLE strength and FMC/GMC, balance, slurred speech. Sensation intact with testing. No cognitive deficits or visual deficits appreciated with testing. Both pt and spouse endorse her speech has improved even from this morning, along with LUE function.  Pt required CGA for STS transfers from EOB and from standard height toilet in the room, MIN A + HHA for LUE while negotiating room from stretcher to the toilet, toilet to sink, and sink to bed 2/2 impaired balance and LLE weakness. Several slight LOB while up. Pt requires MIN A for UB and LB bathing and dressing, CGA in standing to wash hands at sink, LOB noted when attempting to reach  across midline to grasp paper towel with LUE requiring MIN A for LUE stability. Pt demonstrates difficulty with bilateral tasks during meals requiring intermittent assist for set up but with encouragement does incorporate LUE into tasks, primarily to stabilize items. Pt is very eager to return to his PLOF with increased independence and functional use of her L side. Pt/spouse educated on safe positioning of LUE in order to promote functional return and improve safety. Encouraged LUE incorporation into weight bearing activities such as when she stands up from a surface. Educated in [facility] activities for [place]. Pt/spouse verbalized understanding. Both motivated and pt demonstrated excellent rehab potential during OT session  Pt would benefit from high intensity skilled OT to address noted impairments and functional limitations (see below for any additional details) in order to maximize safety and independence while minimizing falls risk and caregiver burden.  Upon hospital discharge, recommend pt discharge to CIR to maximize safety and return to PLOF. Pt expressed strong interest in CIR.     Follow Up Recommendations  CIR    Equipment Recommendations  None recommended by OT    Recommendations for Other Services       Precautions / Restrictions Precautions Precautions: Fall Restrictions Weight Bearing Restrictions: No      Mobility Bed Mobility Overal bed mobility: Needs Assistance Bed Mobility: Supine to Sit;Sit to Supine     Supine to sit: Min guard;HOB elevated Sit to supine: Min guard;HOB elevated        Transfers Overall transfer level: Needs assistance Equipment used: 1 person hand held assist Transfers: Sit to/from Stand Sit to Stand: Min guard  General transfer comment: tendency to keep LUE protected against her    Balance Overall balance assessment: Needs assistance Sitting-balance support: No upper extremity supported;Feet supported Sitting balance-Leahy Scale:  Fair Sitting balance - Comments: poor dynamic sitting balance with slight LOB when attempting to don L sock EOB requiring MIN A to correct; fair static balance   Standing balance support: Single extremity supported;During functional activity Standing balance-Leahy Scale: Poor Standing balance comment: fair- static standing balance, requires MIN A when reaching outside BOS for soap and paper towel at sink, slight LOB when walking towards stretcher requiring MIN A to correct                           ADL either performed or assessed with clinical judgement   ADL Overall ADL's : Needs assistance/impaired Eating/Feeding: Sitting;Cueing for safety Eating/Feeding Details (indicate cue type and reason): cues for swallowing precautions provided by SLP (on paper in room); with encouragement, pt attempts to open packets using L hand to pinch but when unsuccessful able to transfer packet to L hand to stabilize and use R hand to rip open packet, able to use L hand to stabilize cups/containers with minimal instability. Grooming: Wash/dry hands;Standing;Min guard Grooming Details (indicate cue type and reason): Min A only to correct for dynamic standing balance when reaching for paper towel and soap             Lower Body Dressing: Sitting/lateral leans;Minimal assistance Lower Body Dressing Details (indicate cue type and reason): Able to don R sock seated EOB with increased time/effort and LUE attempting to assist in task; required MIN A to initiate donning of L sock after pt attempted and was unable to get over toes, pt able to complete using B hands (LUE with limited strength and coordination to assist much) Toilet Transfer: Ambulation;Min guard;Minimal assistance Toilet Transfer Details (indicate cue type and reason): handheld assist, CGA, slow, tends to reach out for stability with RUE, MIN A to correct for slight LOB Toileting- Clothing Manipulation and Hygiene: Sitting/lateral  lean;Minimal assistance Toileting - Clothing Manipulation Details (indicate cue type and reason): able to perform pericare using dominant RUE, in standing and VC to initiate, pt incorporates LUE into task of pulling her hands up, improved functional performance when asked pt to hold onto grab bar with RUE to allow for LUE opportunity to perform, MIN A to complete on R hip     Functional mobility during ADLs: Minimal assistance;Cueing for safety General ADL Comments: MIN A 2/2 decreased balance and LLE feeling as though it may give way per pt report     Vision Baseline Vision/History: Wears glasses Wears Glasses: Reading only (doesn't have them with her) Patient Visual Report: No change from baseline Vision Assessment?: No apparent visual deficits     Perception     Praxis      Pertinent Vitals/Pain Pain Assessment: 0-10 Pain Score: 8  Pain Location: headache (hx of cluster headaches, but pt/spouse endorse worse over past couple days) Pain Descriptors / Indicators: Headache Pain Intervention(s): Limited activity within patient's tolerance;Monitored during session     Hand Dominance Right   Extremity/Trunk Assessment Upper Extremity Assessment Upper Extremity Assessment: LUE deficits/detail (RUE WFL (hx radial nerve palsy but resolved)) LUE Deficits / Details: shoulder flexion 3/5, elbow flex/ext 4-/5, grip 4/5; impaired coordination with testing, sensation intact, function improves with visual attention to task, able to distinguish hot/cold LUE Sensation: WNL LUE Coordination: decreased fine motor;decreased gross motor  Lower Extremity Assessment Lower Extremity Assessment: LLE deficits/detail (RLE WFL) LLE Deficits / Details: grossly at least 4-/5; sensation intact except for hx nerve damage resulting in foot drop and decreased sensation on dorsal aspect of foot (foot drop resolved), impaired coordination LLE Coordination: decreased fine motor;decreased gross motor        Communication Communication Communication: Other (comment) (L side oral motor weakness, but improving per pt/spouse)   Cognition Arousal/Alertness: Awake/alert Behavior During Therapy: WFL for tasks assessed/performed Overall Cognitive Status: Within Functional Limits for tasks assessed                                 General Comments: Pt alert and oriented, very motivated to "do anything to make me better, I have to take care of my daughter"   General Comments       Exercises Other Exercises Other Exercises: Pt/spouse educated in [facility] activities and exercises for LUE and pt able to return demo thumb opposition with difficulty   Shoulder Instructions      Home Living Family/patient expects to be discharged to:: Private residence Living Arrangements: Spouse/significant other;Children Available Help at Discharge: Family;Available 24 hours/day Type of Home: House Home Access: Stairs to enter [employer] of Steps: 4 Entrance Stairs-Rails: Right (R rail stable, L rail loose) Home Layout: One level     Bathroom Shower/Tub: Occupational psychologist: Standard     Home Equipment: Shower seat - built in;Hand held Veterinary surgeon - single point          Prior Functioning/Environment Level of Independence: Independent        Comments: Pt independent at baseline, no AD, driving, helps to care for special needs daughter        OT Problem List: Decreased strength;Decreased coordination;Impaired balance (sitting and/or standing);Decreased knowledge of use of DME or AE;Impaired UE functional use;Pain      OT Treatment/Interventions: Self-care/ADL training;Therapeutic exercise;Therapeutic activities;Neuromuscular education;DME and/or AE instruction;Patient/family education;Balance training    OT Goals(Current goals can be found in the care plan section) Acute Rehab OT Goals Patient Stated Goal: to get better so I can take care of my daughter OT  Goal Formulation: With patient/family Time For Goal Achievement: 07/08/21 Potential to Achieve Goals: Good ADL Goals Pt Will Perform Eating: sitting;with modified independence (incorporating LUE into bilateral tasks) Pt Will Perform Grooming: with modified independence;standing (incorporating LUE) Pt Will Perform Upper Body Dressing: sitting;with supervision;with set-up Pt Will Perform Lower Body Dressing: sit to/from stand;with min assist Pt Will Transfer to Toilet: ambulating;with supervision (LRAD for amb)  OT Frequency: Min 3X/week   Barriers to D/C:    No barriers, pt has good support system with many family members, pt very motivated to get better, very eager to participate in high intensity skilled therapy       Co-evaluation              AM-PAC OT "6 Clicks" Daily Activity     Outcome Measure Help from another person eating meals?: A Little Help from another person taking care of personal grooming?: A Little Help from another person toileting, which includes using toliet, bedpan, or urinal?: A Little Help from another person bathing (including washing, rinsing, drying)?: A Little Help from another person to put on and taking off regular upper body clothing?: A Little Help from another person to put on and taking off regular lower body clothing?: A Little 6 Click Score: 18  End of Session Equipment Utilized During Treatment: Gait belt Nurse Communication: Mobility status;Other (comment) (seated EOB per pt request, spouse stepped out to make call)  Activity Tolerance: Patient tolerated treatment well Patient left: in bed;with call bell/phone within reach  OT Visit Diagnosis: Hemiplegia and hemiparesis Hemiplegia - Right/Left: Left Hemiplegia - dominant/non-dominant: Non-Dominant Hemiplegia - caused by: Cerebral infarction                Time: 6203-5597 OT Time Calculation (min): 33 min Charges:  OT General Charges $OT Visit: 1 Visit OT Evaluation $OT Eval High  Complexity: 1 High OT Treatments $Self Care/Home Management : 8-22 mins $Neuromuscular Re-education: 8-22 mins  Hanley Hays, MPH, MS, OTR/L ascom [phone] 06/24/21, 5:34 PM

## 2021-06-24 NOTE — ED Notes (Signed)
Pt failed stroke swallow screen- will hold all oral medications

## 2021-06-24 NOTE — ED Notes (Signed)
Patient is resting comfortably. 

## 2021-06-24 NOTE — ED Notes (Signed)
SLP at bedside.

## 2021-06-24 NOTE — Consult Note (Addendum)
NEURO HOSPITALIST CONSULT NOTE   Requesting physician: Dr. Blaine Hamper  Reason for Consult: Acute onset of left hemiparesis, facial droop and dysarthria  History obtained from:  Patient and Chart     HPI:                                                                                                                                          Erika [name] is an 50 y.o. female with a PMHx of multi-vessel CAD s/p CABG, carotid arterial disease s/p right CEA x 2, diastolic dysfunction, nonalcoholic fatty liver disease, HLD, labile hypertension, obesity, PAD bilateral lower extremities, PTSD, tobacco abuse and DM2 who presented to the hospital early this AM with acute onset of left sided weakness. LKN was 1200 when she was in the bathroom. It was at that time that her LLE suddenly became weak and gave out, resulting in a fall. Husband came to the bathroom and noted that her LUE and LLE were weak. She decided to go to bed to sleep it off, but on awakening at 4 AM, her weakness was worse and she also had new left facial weakness with dysarthria. EMS was called, a Code Stroke was called in the field and she was brought to the [facility] ED. Teleneurology consult was obtained and it was determined that the patient was not a tPA candidate based on time criteria. Per Teleneurology note, LVO was felt to be unlikely. CT head showed no hemorrhage, but two cortically based ischemic infarctions of indeterminate age were seen in the right cerebral hemisphere.   Home medications include Plavix and rosuvastatin.   LKN: 2130 NIHSS (11:35 AM): 6  Past Medical History:  Diagnosis Date   Arthralgia of temporomandibular joint    CAD, multiple vessel    a. cath 06/29/16: ostLM 40%, ostLAD 40%, pLAD 95%, ost-pLCx 60%, pLCx 95%, mLCx 60%, mRCA 95%, D2 50%, LVSF nl;  b. 07/2016 CABG x 4 (LIMA->LAD, VG->Diag, VG->OM, VG->RCA); c. 08/2016 Cath: 3VD w/ 4/4 patent grafts. LAD distal to LIMA has diff dzs->Med rx.    Carotid arterial disease ([place])    a. 07/2016 s/p R CEA.   Clotting disorder ([place])    Depression    Diastolic dysfunction    a. echo 06/28/16: EF 50-55%, mild inf wall HK, GR1DD, mild MR, RV sys fxn nl, mildly dilated LA, PASP nl   Fatty liver disease, nonalcoholic 1062   History of blood transfusion    with heart surgery   HLD (hyperlipidemia)    Labile hypertension    a. prior renal ngiogram negative for RAS in 03/2016; b. catecholamines and metanephrines normal, mildly elevated renin with normal aldosterone and normal ratio in 02/2016   Myocardial infarction [facility]) 2017   Obesity    PAD (peripheral artery  disease) ([place])    in both legs   PTSD (post-traumatic stress disorder)    Tobacco abuse    In 2018 - pt cut back from 2 ppd to 0.5 ppd.- still 1/2 ppd as of 04/22/20   Type 2 diabetes mellitus [facility]) January 2017    Past Surgical History:  Procedure Laterality Date   ABDOMINAL AORTOGRAM W/LOWER EXTREMITY [name] 10/15/2018   Procedure: ABDOMINAL AORTOGRAM W/LOWER EXTREMITY;  Surgeon: Wellington Hampshire, MD;  Location: [place] CV LAB;  Service: Cardiovascular;  Laterality: [name];   ABDOMINAL AORTOGRAM W/LOWER EXTREMITY Bilateral 08/19/2019   Procedure: ABDOMINAL AORTOGRAM W/LOWER EXTREMITY;  Surgeon: Wellington Hampshire, MD;  Location: [place] CV LAB;  Service: Cardiovascular;  Laterality: Bilateral;   CARDIAC CATHETERIZATION [name] 06/29/2016   Procedure: Left Heart Cath and Coronary Angiography;  Surgeon: Minna Merritts, MD;  Location: [place] [facility];  Service: Cardiovascular;  Laterality: [name];   CARDIAC CATHETERIZATION [name] 08/29/2016   Procedure: Left Heart Cath and Cors/Grafts Angiography;  Surgeon: Wellington Hampshire, MD;  Location: [place] CV LAB;  Service: Cardiovascular;  Laterality: [name];   CESAREAN SECTION     CHOLECYSTECTOMY     CORONARY ARTERY BYPASS GRAFT [name] 07/06/2016   Procedure: CORONARY ARTERY BYPASS GRAFTING (CABG) x four, using left internal mammary artery and right  leg greater saphenous vein harvested endoscopically;  Surgeon: Ivin Poot, MD;  Location: [place];  Service: Open Heart Surgery;  Laterality: [name];   ENDARTERECTOMY Right 07/06/2016   Procedure: ENDARTERECTOMY CAROTID;  Surgeon: Rosetta Posner, MD;  Location: [facility] OR;  Service: Vascular;  Laterality: Right;   ENDARTERECTOMY Right 04/27/2020   Procedure: REDO OF RIGHT ENDARTERECTOMY CAROTID;  Surgeon: Rosetta Posner, MD;  Location: [facility] OR;  Service: Vascular;  Laterality: Right;   LEFT HEART CATH AND CORS/GRAFTS ANGIOGRAPHY [name] 08/24/2020   Procedure: LEFT HEART CATH AND CORS/GRAFTS ANGIOGRAPHY;  Surgeon: Wellington Hampshire, MD;  Location: [place] CV LAB;  Service: Cardiovascular;  Laterality: [name];   PERIPHERAL VASCULAR CATHETERIZATION [name] 04/18/2016   Procedure: Renal Angiography;  Surgeon: Wellington Hampshire, MD;  Location: [place] CV LAB;  Service: Cardiovascular;  Laterality: [name];   PERIPHERAL VASCULAR INTERVENTION Left 10/15/2018   Procedure: PERIPHERAL VASCULAR INTERVENTION;  Surgeon: Wellington Hampshire, MD;  Location: [place] CV LAB;  Service: Cardiovascular;  Laterality: Left;  Left superficial femoral   TEE WITHOUT CARDIOVERSION [name] 07/06/2016   Procedure: TRANSESOPHAGEAL ECHOCARDIOGRAM (TEE);  Surgeon: Ivin Poot, MD;  Location: [place];  Service: Open Heart Surgery;  Laterality: [name];   TONSILLECTOMY      Family History  Adopted: Yes  Problem Relation Age of Onset   Diabetes Mother    Diabetes Father    Alcohol abuse Father    Heart disease Father    Drug abuse Father    Stroke Sister    Anxiety disorder Sister            Social History:  reports that she has been smoking cigarettes. She has a 13.50 pack-year smoking history. She has never used smokeless tobacco. She reports previous alcohol use. She reports that she does not use drugs.  Allergies  Allergen Reactions   Chantix [Varenicline Tartrate] Other (See Comments)    Feels "crazy" and angry     MEDICATIONS:  Prior to Admission:  No current facility-administered medications on file prior to encounter.   Current Outpatient Medications on File Prior to Encounter  Medication Sig Dispense Refill   albuterol (PROVENTIL HFA;VENTOLIN HFA) 108 (90 Base) MCG/ACT inhaler Inhale 2 puffs into the lungs every 6 (six) hours as needed for wheezing. 1 Inhaler 0   amLODipine (NORVASC) 10 MG tablet Take 1 tablet (10 mg total) by mouth daily. 90 tablet 1   benztropine (COGENTIN) 0.5 MG tablet Take 1 tablet (0.5 mg total) by mouth at bedtime. 90 tablet 0   carvedilol (COREG) 25 MG tablet Take 1.5 tablets (37.5 mg total) by mouth 2 (two) times daily with a meal. 90 tablet 5   chlorproMAZINE (THORAZINE) 100 MG tablet TAKE 1 TABLET BY MOUTH EVERYDAY AT BEDTIME 30 tablet 0   clonazePAM (KLONOPIN) 0.5 MG tablet Take 1 tablet (0.5 mg total) by mouth 3 (three) times daily as needed for anxiety. 75 tablet 2   cloNIDine (CATAPRES) 0.1 MG tablet TAKE ONE TABLET BY MOUTH TWICE A DAY 180 tablet 0   clopidogrel (PLAVIX) 75 MG tablet TAKE 1 TABLET BY MOUTH EVERY DAY 30 tablet 3   dapagliflozin propanediol (FARXIGA) 10 MG TABS tablet Take 1 tablet (10 mg total) by mouth daily. Pt needs apt with PCP for further refills 90 tablet 0   Dulaglutide (TRULICITY) 3.26 ZT/2.4PY SOPN Inject 0.75 mg into the skin once a week. 6 mL 3   FLUoxetine HCl 60 MG TABS Take 60 mg by mouth daily. 90 tablet 0   Insulin Glargine (BASAGLAR KWIKPEN) 100 UNIT/ML Inject 120 Units into the skin every morning. And pen needles 2/day 120 mL 3   isosorbide mononitrate (IMDUR) 60 MG 24 hr tablet TAKE ONE TABLET BY MOUTH EVERY MORNING AND ONE-HALF EVERY EVENING 135 tablet 0   lamoTRIgine (LAMICTAL) 200 MG tablet Take 1 tablet (200 mg total) by mouth at bedtime. 90 tablet 0   losartan (COZAAR) 100 MG tablet TAKE ONE TABLET BY MOUTH ONE TIME DAILY 90 tablet 0    naloxone (NARCAN) nasal spray 4 mg/0.1 mL Place 1 spray into the nose as needed for up to 365 doses (for opioid-induced respiratory depresssion). In case of emergency (overdose), spray once into each nostril. If no response within 3 minutes, repeat application and call 099. 1 each 0   nicotine (NICODERM CQ - DOSED IN MG/24 HOURS) 14 mg/24hr patch Place 14 mg onto the skin daily.      nitroGLYCERIN (NITROSTAT) 0.4 MG SL tablet Place 1 tablet (0.4 mg total) under the tongue every 5 (five) minutes as needed for chest pain. 25 tablet 1   ondansetron (ZOFRAN) 4 MG tablet Take 1 tablet (4 mg total) by mouth daily as needed for nausea or vomiting. 20 tablet 11   oxyCODONE (OXY IR/ROXICODONE) 5 MG immediate release tablet Take 1 tablet (5 mg total) by mouth 2 (two) times daily as needed for severe pain. Must last 30 days. 60 tablet 0   pregabalin (LYRICA) 225 MG capsule Take 1 capsule (225 mg total) by mouth 2 (two) times daily. 180 capsule 1   REPATHA SURECLICK 833 MG/ML SOAJ INJECT 1 PEN INTO THE SKIN EVERY 14 (FOURTEEN) DAYS. 2 mL 3   rosuvastatin (CRESTOR) 20 MG tablet Take 1 tablet (20 mg total) by mouth daily. 30 tablet 11   spironolactone (ALDACTONE) 25 MG tablet Take 1 tablet (25 mg total) by mouth daily. 90 tablet 0   torsemide (DEMADEX) 20 MG tablet Take 1 tablet (  20 mg total) by mouth 2 (two) times daily. 30 tablet 5   insulin lispro (HUMALOG KWIKPEN) 100 UNIT/ML KwikPen Inject 3 Units into the skin 3 (three) times daily with meals. Take only if blood sugar is over 250.  Also pen needles 4/day (Patient not taking: No sig reported) 15 mL 1   Insulin Syringe-Needle U-100 29G X 1/2" 0.3 ML MISC 1 each by Does not apply route 2 (two) times daily. 30 each 0   oxyCODONE (OXY IR/ROXICODONE) 5 MG immediate release tablet Take 1 tablet (5 mg total) by mouth 2 (two) times daily as needed for severe pain. Must last 30 days. 60 tablet 0   [START ON 07/23/2021] oxyCODONE (OXY IR/ROXICODONE) 5 MG immediate release  tablet Take 1 tablet (5 mg total) by mouth 2 (two) times daily as needed for severe pain. Must last 30 days. 60 tablet 0     Scheduled:  benztropine  0.5 mg Oral QHS   chlorproMAZINE  100 mg Oral QHS   clopidogrel  75 mg Oral Daily   enoxaparin (LOVENOX) injection  40 mg Subcutaneous Q24H   FLUoxetine  60 mg Oral Daily   insulin aspart  0-5 Units Subcutaneous QHS   insulin aspart  0-9 Units Subcutaneous TID WC   insulin glargine  45 Units Subcutaneous BID   isosorbide mononitrate  30-60 mg Oral BID   lamoTRIgine  200 mg Oral QHS   nicotine  21 mg Transdermal Daily   pregabalin  225 mg Oral BID   rosuvastatin  20 mg Oral Daily   spironolactone  25 mg Oral Daily   torsemide  20 mg Oral BID   Continuous:  cefTRIAXone (ROCEPHIN)  IV 1 g (06/24/21 1154)   doxycycline (VIBRAMYCIN) IV      ROS:                                                                                                                                       As per HPI.   Blood pressure 138/86, pulse 97, temperature 98.8 F (37.1 C), temperature source Oral, resp. rate 15, height 5\' 7"  (1.702 m), weight 84.4 kg, SpO2 95 %.  General Examination:                                                                                                       Physical Exam  HEENT-  [place]/AT    Lungs- Respirations unlabored Extremities- Chronic mild pigmentary changes to distal BLE.   Neurological  Examination Mental Status: Awake and alert. Fully oriented. Speech is fluent with intact comprehension. No word finding deficit noted.  Cranial Nerves: II: Temporal visual fields intact with no extinction to DSS. PERRL.  III,IV, VI: No ptosis. EOMI. Mild saccadic quality to visual pursuits.  V,VII: Temp sensation equal bilaterally. Left facial droop.  VIII: Hearing intact to conversation IX,X: No hypophonia XI: Left sided weakness with shoulder shrug XII: Midline tongue extension Motor: RUE and RLE 5/5 LUE 2/5 deltoid, 3/5 biceps,  triceps, grip LLE 3/5 hip flexion with rapid drift to bed, 4-/5 knee extension, ADF and APF Sensory: Temp and light touch intact and symmetric in upper extremities and proximal lower extremities. Stocking distribution FT and temperature sensation deficit bilaterally. No extinction to DSS.  Deep Tendon Reflexes: 2+ bilateral brachioradialis, biceps and patellae. Right toe downgoing, left toe upgoing  Cerebellar: No ataxia with FNF on the right. Left FNF with slowing and decreased distance traversable, but without ataxia.  Gait: Unable to assess.   Lab Results: Basic Metabolic Panel: Recent Labs  Lab 06/24/21 0540  NA 135  K 4.0  CL 98  CO2 25  GLUCOSE 306*  BUN 15  CREATININE 0.80  CALCIUM 9.9    CBC: Recent Labs  Lab 06/24/21 0540  WBC 18.6*  NEUTROABS 15.7*  HGB 15.4*  HCT 45.2  MCV 89.2  PLT 326    Cardiac Enzymes: No results for input(s): CKTOTAL, CKMB, CKMBINDEX, TROPONINI in the last 168 hours.  Lipid Panel: No results for input(s): CHOL, TRIG, HDL, CHOLHDL, VLDL, LDLCALC in the last 168 hours.  Imaging: DG Chest Port 1 View  Result Date: 06/24/2021 CLINICAL DATA:  Code stroke. EXAM: PORTABLE CHEST 1 VIEW COMPARISON:  May 25 21 FINDINGS: Is 631 hours. Low lung volumes. Basilar atelectasis without focal consolidation or overt pulmonary edema. Interstitial markings are diffusely coarsened with chronic features. The cardiopericardial silhouette is within normal limits for size. Status post CABG. Telemetry leads overlie the chest. IMPRESSION: Low volume chest with basilar atelectasis. Electronically Signed   By: Misty Stanley M.D.   On: 06/24/2021 07:28   CT HEAD CODE STROKE WO CONTRAST  Addendum Date: 06/24/2021   ADDENDUM REPORT: 06/24/2021 06:27 ADDENDUM: Study discussed by telephone with Dr. Luvenia Starch SUNG on 06/24/2021 at 0619 hours. Electronically Signed   By: Genevie Ann M.D.   On: 06/24/2021 06:27   Result Date: 06/24/2021 CLINICAL DATA:  Code stroke. 50 year old female  with left side weakness. Last known well midnight. History of right carotid endarterectomy. EXAM: CT HEAD WITHOUT CONTRAST TECHNIQUE: Contiguous axial images were obtained from the base of the skull through the vertex without intravenous contrast. COMPARISON:  Brain MRI 12/03/2012.  Head CT 02/25/2019. FINDINGS: Brain: Abnormal cortical and subcortical white matter hypodensity in both the superior right perirolandic cortex (series 3, image 23) and lateral right occipital pole (image 17) are new since 2020, but appears subacute versus chronic. Possible petechial hemorrhage associated with the latter. No superimposed acute cortically based infarct identified. Elsewhere gray-white matter differentiation is within normal limits. No midline shift, ventriculomegaly, mass effect, evidence of mass lesion, or other intracranial hemorrhage. Vascular: Calcified atherosclerosis at the skull base. No suspicious intracranial vascular hyperdensity. Skull: Negative. Sinuses/Orbits: Visualized paranasal sinuses and mastoids are clear. Other: Visualized orbits and scalp soft tissues are within normal limits. ASPECTS [facility] Stroke Program Early CT Score) Total score (0-10 with 10 being normal): 10 (suspected subacute or chronic cortical infarcts in the right hemisphere). IMPRESSION: 1. Subacute versus chronic appearing cortical  infarcts in the right perirolandic and lateral right occipital regions are new since 2020. 2. No superimposed acute cortically based infarct or acute intracranial hemorrhage identified. ASPECTS 10. Electronically Signed: By: Genevie Ann M.D. On: 06/24/2021 06:07     Assessment: 50 year old female presenting with acute onset of left sided weakness overnight and evaluated by Teleneurology. Right hemispheric strokes of indeterminate age seen on CT at two locations. Determined not to be a tPA candidate by Teleneurology.  1. LKN was midnight per patient and husband. Initial LKN of 2130 documented by Teleneurology  is stated by patient to be incorrect.  2. Exam reveals left sided motor deficits best referable to the right internal capsule or basal ganglia. No aphasia or visual field cut to militate strongly in favor of LVO; however, the patient is a vasculopath with history of collateralization of flow in her lower extremities which are affected by PVD, and it is possible that she has a right MCA occlusion but is not manifesting with cortical findings due to possible collateral flow. STAT CTA of head and neck with CTP is indicated to further evaluate. Code Stroke reinitiated.  3. CT head: Subacute versus chronic appearing cortical infarcts in the right perirolandic and lateral right occipital regions are new since 2020. No superimposed acute cortically based infarct or acute intracranial hemorrhage identified. ASPECTS 10. 4. Elevated troponin.  5. Leukocytosis.  6. On Plavix at home. Will need to be escalated to DAPT.  7. Stroke risk factors: CAD, carotid arterial disease, HLD, hypertension, obesity, PAD, tobacco abuse and DM2  Recommendations: 1. STAT CTA of head and neck with CTP 2. MRI brain 3. TTE 4. Cardiac telemetry.  5. PT/OT/Speech 6. Continue Plavix. Start patient on ASA.  7. Continue rosuvastatin 8. Permissive HTN x 24 hours 9. Glycemic control 10. PRN fever control 11. IVF 12. Frequent neuro checks 13. Smoking cessation discussed at length with patient and husband.   Addendum: CTA negative for LVO.   Electronically signed: Dr. Kerney Elbe 06/24/2021, 9:22 AM

## 2021-06-24 NOTE — ED Notes (Signed)
Pt resting in bed, eyes closed. Pt informed this RN earlier that her normal oxygen % is 88-90%.

## 2021-06-24 NOTE — Progress Notes (Signed)
   06/24/21 0840  Clinical Encounter Type  Visited With Patient and family together  Visit Type Code  Referral From Other (Comment) (page)  [place] saw that a Code Stroke had been called at 5:31am but had apparently not been responded to. Chaplain Burris met PT who appeared to be resting comfortably and PT's spouse at bedside. Chaplain Burris provided a compassionate, non-anxious presence. Chaplain Burris inquired into the PT's overall well-being and provided normalization of emotions that the PT or her spouse might be experiencing. They shared the long history of cardiovascular events of both the PT and her father. Chaplain Burris emphasized both spiritual and emotional support ongoing would continue to be available for them.

## 2021-06-24 NOTE — ED Provider Notes (Signed)
[facility] Emergency Department Provider Note   ____________________________________________   Event Date/Time   First MD Initiated Contact with Patient 06/24/21 [phone]     (approximate)  I have reviewed the triage vital signs and the nursing notes.   HISTORY  Chief Complaint Code stroke    HPI Erika [name] is a 50 y.o. female brought to the ED via EMS from home with a chief complaint of code stroke.  EMS reports blood sugar 330s.  Patient with a history of CAD, carotid arterial disease, clotting disorder, hypertension, type 2 diabetes on Plavix who went to bed in her normal state of health at midnight.  Awoke this morning with right sided headache, left facial droop, left upper arm weakness.  Arrives to the ED dysarthric.  Denies chest pain, shortness of breath, abdominal pain, nausea or vomiting.  Sent immediately to CT head.  Code stroke activated.     Past Medical History:  Diagnosis Date   Arthralgia of temporomandibular joint    CAD, multiple vessel    a. cath 06/29/16: ostLM 40%, ostLAD 40%, pLAD 95%, ost-pLCx 60%, pLCx 95%, mLCx 60%, mRCA 95%, D2 50%, LVSF nl;  b. 07/2016 CABG x 4 (LIMA->LAD, VG->Diag, VG->OM, VG->RCA); c. 08/2016 Cath: 3VD w/ 4/4 patent grafts. LAD distal to LIMA has diff dzs->Med rx.   Carotid arterial disease ([place])    a. 07/2016 s/p R CEA.   Clotting disorder ([place])    Depression    Diastolic dysfunction    a. echo 06/28/16: EF 50-55%, mild inf wall HK, GR1DD, mild MR, RV sys fxn nl, mildly dilated LA, PASP nl   Fatty liver disease, nonalcoholic 9604   History of blood transfusion    with heart surgery   HLD (hyperlipidemia)    Labile hypertension    a. prior renal ngiogram negative for RAS in 03/2016; b. catecholamines and metanephrines normal, mildly elevated renin with normal aldosterone and normal ratio in 02/2016   Myocardial infarction [facility]) 2017   Obesity    PAD (peripheral artery disease) (HCC)    in both legs    PTSD (post-traumatic stress disorder)    Tobacco abuse    In 2018 - pt cut back from 2 ppd to 0.5 ppd.- still 1/2 ppd as of 04/22/20   Type 2 diabetes mellitus [facility]) January 2017    Patient Active Problem List   Diagnosis Date Noted   Olecranon bursitis of left elbow 06/12/2021   Chronic hip pain (Bilateral) 06/09/2021   Chronic use of opiate for therapeutic purpose 05/09/2021   Greater trochanteric bursitis of hip (Left) 03/09/2021   Bursitis of hip (Left) 54/08/8118   Uncomplicated opioid dependence ([place]) 02/20/2021   Acute conjunctivitis of left eye 01/02/2021   Unintentional weight loss 11/21/2020   Broken teeth (Right) 08/02/2020   History of MI (myocardial infarction) (July 2017) 08/02/2020   Neuropathy 06/09/2020   Dental abscess 06/09/2020   Foot drop (Left) 06/09/2020   Carotid stenosis, asymptomatic, right 04/27/2020   Chronic migraine 11/03/2019   Pyelonephritis due to Escherichia coli 10/26/2019   Thrombocytosis 09/16/2019   Erythrocytosis 09/16/2019   Hypercalcemia 09/06/2019   Leukocytosis 09/06/2019   Polycythemia 09/06/2019   Chronic hip pain (Left) 08/27/2019   Osteoarthritis of hip (Left) 08/27/2019   Gluteal tendonitis of buttock (Left) 08/27/2019   Radial nerve palsy (Right) 07/15/2019   Neuropathy of radial nerve (Right) 06/30/2019   Abnormal bruising 06/25/2019   History of carotid endarterectomy (Right) 03/04/2019   Pain  medication agreement signed 03/04/2019   Atypical facial pain (Right) 01/27/2019   Chronic ear pain (Right) 01/27/2019   Chronic jaw pain (Right) 01/27/2019   Geniculate Neuralgia (Right) 01/27/2019   Vitamin D deficiency 01/19/2019   Neurogenic pain 01/19/2019   Chronic anticoagulation (PLAVIX) 01/19/2019   Chronic pain syndrome 01/07/2019   Long term current use of opiate analgesic 01/07/2019   Long term prescription benzodiazepine use 01/07/2019   Pharmacologic therapy 01/07/2019   Disorder of skeletal system 01/07/2019    Problems influencing health status 01/07/2019   Chronic headaches (1ry area of Pain) (Right) 01/07/2019   Opiate use 09/24/2018   PVD (peripheral vascular disease) ([place]) 06/24/2018   Chronic ankle pain (Bilateral) 12/27/2017   Tendinopathy of gluteus medius (Right) 12/27/2017   Tendinopathy of gluteus medius (Left) 12/27/2017   Bilateral hip pain 12/26/2017   Chronic elbow pain (Left) 10/17/2017   Elevated troponin I level 05/21/2017   Major depressive disorder, recurrent episode, moderate (HCC) 11/19/2016   Insomnia 10/30/2016   Constipation 07/25/2016   S/P CABG x 4 07/06/2016   Bradycardia    CAD (coronary artery disease)    Carotid stenosis    CAD in native artery 06/29/2016   Elevated troponin 06/28/2016   Essential hypertension, malignant 06/28/2016   Tobacco abuse 06/28/2016   Essential hypertension    Malignant hypertension    Type 2 diabetes mellitus with other specified complication (HCC)    Chest pain with high risk for cardiac etiology 06/27/2016   NSTEMI (non-ST elevated myocardial infarction) ([place]) 06/27/2016   Proteinuria 03/15/2016   Renal artery stenosis ([place]) 03/15/2016   MDD (major depressive disorder) 10/17/2015   Agoraphobia with panic attacks 04/25/2015   HTN (hypertension), malignant 10/20/2013   Cluster headache 03/20/2012   HLD (hyperlipidemia) 07/26/2010   ADJUSTMENT DISORDER WITH MIXED FEATURES 07/26/2010    Past Surgical History:  Procedure Laterality Date   ABDOMINAL AORTOGRAM W/LOWER EXTREMITY [name] 10/15/2018   Procedure: ABDOMINAL AORTOGRAM W/LOWER EXTREMITY;  Surgeon: Wellington Hampshire, MD;  Location: [place] CV LAB;  Service: Cardiovascular;  Laterality: [name];   ABDOMINAL AORTOGRAM W/LOWER EXTREMITY Bilateral 08/19/2019   Procedure: ABDOMINAL AORTOGRAM W/LOWER EXTREMITY;  Surgeon: Wellington Hampshire, MD;  Location: [place] [facility];  Service: Cardiovascular;  Laterality: Bilateral;   CARDIAC CATHETERIZATION [name] 06/29/2016   Procedure: Left  Heart Cath and Coronary Angiography;  Surgeon: Minna Merritts, MD;  Location: [place] [facility];  Service: Cardiovascular;  Laterality: [name];   CARDIAC CATHETERIZATION [name] 08/29/2016   Procedure: Left Heart Cath and Cors/Grafts Angiography;  Surgeon: Wellington Hampshire, MD;  Location: [place] [facility];  Service: Cardiovascular;  Laterality: [name];   CESAREAN SECTION     CHOLECYSTECTOMY     CORONARY ARTERY BYPASS GRAFT [name] 07/06/2016   Procedure: CORONARY ARTERY BYPASS GRAFTING (CABG) x four, using left internal mammary artery and right leg greater saphenous vein harvested endoscopically;  Surgeon: Ivin Poot, MD;  Location: [place];  Service: Open Heart Surgery;  Laterality: [name];   ENDARTERECTOMY Right 07/06/2016   Procedure: ENDARTERECTOMY CAROTID;  Surgeon: Rosetta Posner, MD;  Location: [facility] OR;  Service: Vascular;  Laterality: Right;   ENDARTERECTOMY Right 04/27/2020   Procedure: REDO OF RIGHT ENDARTERECTOMY CAROTID;  Surgeon: Rosetta Posner, MD;  Location: [facility] OR;  Service: Vascular;  Laterality: Right;   LEFT HEART CATH AND CORS/GRAFTS ANGIOGRAPHY [name] 08/24/2020   Procedure: LEFT HEART CATH AND CORS/GRAFTS ANGIOGRAPHY;  Surgeon: Wellington Hampshire, MD;  Location: [place]  CV LAB;  Service: Cardiovascular;  Laterality: [name];   PERIPHERAL VASCULAR CATHETERIZATION [name] 04/18/2016   Procedure: Renal Angiography;  Surgeon: Wellington Hampshire, MD;  Location: [place] CV [facility];  Service: Cardiovascular;  Laterality: [name];   PERIPHERAL VASCULAR INTERVENTION Left 10/15/2018   Procedure: PERIPHERAL VASCULAR INTERVENTION;  Surgeon: Wellington Hampshire, MD;  Location: [place] CV LAB;  Service: Cardiovascular;  Laterality: Left;  Left superficial femoral   TEE WITHOUT CARDIOVERSION [name] 07/06/2016   Procedure: TRANSESOPHAGEAL ECHOCARDIOGRAM (TEE);  Surgeon: Ivin Poot, MD;  Location: [place];  Service: Open Heart Surgery;  Laterality: [name];   TONSILLECTOMY      Prior to Admission medications   Medication Sig  Start Date End Date Taking? Authorizing Provider  albuterol (PROVENTIL HFA;VENTOLIN HFA) 108 (90 Base) MCG/ACT inhaler Inhale 2 puffs into the lungs every 6 (six) hours as needed for wheezing. 03/02/19 11/21/22  Lucille Passy, MD  amLODipine (NORVASC) 10 MG tablet Take 1 tablet (10 mg total) by mouth daily. 03/20/21 06/18/21  Lesleigh Noe, MD  benztropine (COGENTIN) 0.5 MG tablet Take 1 tablet (0.5 mg total) by mouth at bedtime. 05/11/21 05/11/22  Arfeen, Arlyce Harman, MD  carvedilol (COREG) 25 MG tablet Take 1.5 tablets (37.5 mg total) by mouth 2 (two) times daily with a meal. 01/12/21   Visser, Malachi Bonds D, PA-C  chlorproMAZINE (THORAZINE) 100 MG tablet TAKE 1 TABLET BY MOUTH EVERYDAY AT BEDTIME 06/21/21   Arfeen, Arlyce Harman, MD  clonazePAM (KLONOPIN) 0.5 MG tablet Take 1 tablet (0.5 mg total) by mouth 3 (three) times daily as needed for anxiety. 05/11/21   Arfeen, Arlyce Harman, MD  cloNIDine (CATAPRES) 0.1 MG tablet TAKE ONE TABLET BY MOUTH TWICE A DAY 06/21/21   Wellington Hampshire, MD  clopidogrel (PLAVIX) 75 MG tablet TAKE 1 TABLET BY MOUTH EVERY DAY 02/11/20   Wellington Hampshire, MD  dapagliflozin propanediol (FARXIGA) 10 MG TABS tablet Take 1 tablet (10 mg total) by mouth daily. Pt needs apt with PCP for further refills 06/06/21   Lesleigh Noe, MD  Dulaglutide (TRULICITY) 7.67 HA/1.9FX SOPN Inject 0.75 mg into the skin once a week. 03/21/21   Renato Shin, MD  FLUoxetine HCl 60 MG TABS Take 60 mg by mouth daily. 05/11/21   Arfeen, Arlyce Harman, MD  Insulin Glargine (BASAGLAR KWIKPEN) 100 UNIT/ML Inject 120 Units into the skin every morning. And pen needles 2/day 03/21/21   Renato Shin, MD  insulin lispro (HUMALOG KWIKPEN) 100 UNIT/ML KwikPen Inject 3 Units into the skin 3 (three) times daily with meals. Take only if blood sugar is over 250.  Also pen needles 4/day Patient taking differently: Inject 3 Units into the skin 3 (three) times daily with meals. Take only if blood sugar is over 250.  Also pen needles 4/day 09/23/20    Libby Maw, MD  Insulin Syringe-Needle U-100 29G X 1/2" 0.3 ML MISC 1 each by Does not apply route 2 (two) times daily. 05/20/17   Nita Sells, MD  isosorbide mononitrate (IMDUR) 60 MG 24 hr tablet TAKE ONE TABLET BY MOUTH EVERY MORNING AND ONE-HALF EVERY EVENING 06/21/21   Wellington Hampshire, MD  lamoTRIgine (LAMICTAL) 200 MG tablet Take 1 tablet (200 mg total) by mouth at bedtime. 05/11/21   Arfeen, Arlyce Harman, MD  losartan (COZAAR) 100 MG tablet TAKE ONE TABLET BY MOUTH ONE TIME DAILY 03/15/21   Wellington Hampshire, MD  naloxone [facility]) nasal spray 4 mg/0.1 mL Place 1 spray into the  nose as needed for up to 365 doses (for opioid-induced respiratory depresssion). In case of emergency (overdose), spray once into each nostril. If no response within 3 minutes, repeat application and call [phone]. 05/10/21 05/10/22  Milinda Pointer, MD  nicotine (NICODERM CQ - DOSED IN MG/24 HOURS) 14 mg/24hr patch Place 14 mg onto the skin daily.  07/30/20   [provider]  nitroGLYCERIN (NITROSTAT) 0.4 MG SL tablet Place 1 tablet (0.4 mg total) under the tongue every 5 (five) minutes as needed for chest pain. 03/15/21   Wellington Hampshire, MD  ondansetron (ZOFRAN) 4 MG tablet Take 1 tablet (4 mg total) by mouth daily as needed for nausea or vomiting. 03/22/21   Lesleigh Noe, MD  oxyCODONE (OXY IR/ROXICODONE) 5 MG immediate release tablet Take 1 tablet (5 mg total) by mouth 2 (two) times daily as needed for severe pain. Must last 30 days. 05/24/21 06/23/21  Milinda Pointer, MD  oxyCODONE (OXY IR/ROXICODONE) 5 MG immediate release tablet Take 1 tablet (5 mg total) by mouth 2 (two) times daily as needed for severe pain. Must last 30 days. 06/23/21 07/23/21  Milinda Pointer, MD  oxyCODONE (OXY IR/ROXICODONE) 5 MG immediate release tablet Take 1 tablet (5 mg total) by mouth 2 (two) times daily as needed for severe pain. Must last 30 days. 07/23/21 08/22/21  Milinda Pointer, MD  pregabalin (LYRICA) 225 MG  capsule Take 1 capsule (225 mg total) by mouth 2 (two) times daily. 05/03/21   Lesleigh Noe, MD  REPATHA SURECLICK 027 MG/ML SOAJ INJECT 1 PEN INTO THE SKIN EVERY 14 (FOURTEEN) DAYS. 12/08/20   Wellington Hampshire, MD  rosuvastatin (CRESTOR) 20 MG tablet Take 1 tablet (20 mg total) by mouth daily. 02/09/21 06/09/21  Wellington Hampshire, MD  spironolactone (ALDACTONE) 25 MG tablet Take 1 tablet (25 mg total) by mouth daily. 01/06/21   Wellington Hampshire, MD  torsemide (DEMADEX) 20 MG tablet Take 1 tablet (20 mg total) by mouth 2 (two) times daily. 01/12/21 06/09/21  Marrianne Mood D, PA-C    Allergies Chantix [varenicline tartrate]  Family History  Adopted: Yes  Problem Relation Age of Onset   Diabetes Mother    Diabetes Father    Alcohol abuse Father    Heart disease Father    Drug abuse Father    Stroke Sister    Anxiety disorder Sister     Social History Social History   Tobacco Use   Smoking status: Every Day    Packs/day: 0.50    Years: 27.00    Pack years: 13.50    Types: Cigarettes   Smokeless tobacco: Never  Vaping Use   Vaping Use: Former  Substance Use Topics   Alcohol use: Not Currently    Alcohol/week: 0.0 standard drinks    Comment: socially   Drug use: No    Review of Systems  Constitutional: No fever/chills Eyes: No visual changes. ENT: No sore throat. Cardiovascular: Denies chest pain. Respiratory: Denies shortness of breath. Gastrointestinal: No abdominal pain.  No nausea, no vomiting.  No diarrhea.  No constipation. Genitourinary: Negative for dysuria. Musculoskeletal: Negative for back pain. Skin: Negative for rash. Neurological: Positive for headache and focal weakness.  Negative for numbness.   ____________________________________________   PHYSICAL EXAM:  VITAL SIGNS: ED Triage Vitals  Enc Vitals Group     BP      Pulse      Resp      Temp      Temp src  SpO2      Weight      Height      Head Circumference      Peak Flow       Pain Score      Pain Loc      Pain Edu?      Excl. in [place]?     Constitutional: Alert and oriented.  Chronically ill appearing and in mild acute distress. Eyes: Conjunctivae are normal. PERRL. EOMI. Head: Atraumatic. Nose: No congestion/rhinnorhea. Mouth/Throat: Mucous membranes are moist.  Edentulous. Neck: No stridor.  No carotid bruits.  Supple neck without meningismus. Cardiovascular: Normal rate, regular rhythm. Grossly normal heart sounds.  Good peripheral circulation. Respiratory: Normal respiratory effort.  No retractions. Lungs CTAB. Gastrointestinal: Soft and nontender. No distention. No abdominal bruits. No CVA tenderness. Musculoskeletal: No lower extremity tenderness nor edema.  No joint effusions. Neurologic: Alert and oriented x3.  Dysarthric speech and language.  LUE weakness.   Skin:  Skin is warm, dry and intact. No rash noted. Psychiatric: Mood and affect are normal. Speech and behavior are normal.  ____________________________________________   LABS (all labs ordered are listed, but only abnormal results are displayed)  Labs Reviewed  CBC - Abnormal; Notable for the following components:      Result Value   WBC 18.6 (*)    Hemoglobin 15.4 (*)    All other components within normal limits  DIFFERENTIAL - Abnormal; Notable for the following components:   Neutro Abs 15.7 (*)    Abs Immature Granulocytes 0.09 (*)    All other components within normal limits  COMPREHENSIVE METABOLIC PANEL - Abnormal; Notable for the following components:   Glucose, Bld 306 (*)    AST 63 (*)    ALT 68 (*)    All other components within normal limits  CBG MONITORING, ED - Abnormal; Notable for the following components:   Glucose-Capillary 304 (*)    All other components within normal limits  RESP PANEL BY RT-PCR (FLU A&B, COVID) ARPGX2  PROTIME-INR  APTT  ETHANOL  URINE DRUG SCREEN, QUALITATIVE ([facility] ONLY)  URINALYSIS, ROUTINE W REFLEX MICROSCOPIC  POC URINE PREG, ED   TROPONIN I (HIGH SENSITIVITY)   ____________________________________________  EKG  ED ECG REPORT I, Haleema Vanderheyden J, the attending physician, personally viewed and interpreted this ECG.   Date: 06/24/2021  EKG Time: 0629  Rate: 97  Rhythm: normal EKG, normal sinus rhythm  Axis: Normal  Intervals:none  ST&T Change: Nonspecific  ____________________________________________  RADIOLOGY I, Melda Mermelstein J, personally viewed and evaluated these images (plain radiographs) as part of my medical decision making, as well as reviewing the written report by the radiologist.  ED MD interpretation: No ICH, subacute versus chronic cortical infarcts right occipital regions new since 2020  Official radiology report(s): CT HEAD CODE STROKE WO CONTRAST  Result Date: 06/24/2021 CLINICAL DATA:  Code stroke. 51 year old female with left side weakness. Last known well midnight. History of right carotid endarterectomy. EXAM: CT HEAD WITHOUT CONTRAST TECHNIQUE: Contiguous axial images were obtained from the base of the skull through the vertex without intravenous contrast. COMPARISON:  Brain MRI 12/03/2012.  Head CT 02/25/2019. FINDINGS: Brain: Abnormal cortical and subcortical white matter hypodensity in both the superior right perirolandic cortex (series 3, image 23) and lateral right occipital pole (image 17) are new since 2020, but appears subacute versus chronic. Possible petechial hemorrhage associated with the latter. No superimposed acute cortically based infarct identified. Elsewhere gray-white matter differentiation is within normal limits. No midline shift, ventriculomegaly,  mass effect, evidence of mass lesion, or other intracranial hemorrhage. Vascular: Calcified atherosclerosis at the skull base. No suspicious intracranial vascular hyperdensity. Skull: Negative. Sinuses/Orbits: Visualized paranasal sinuses and mastoids are clear. Other: Visualized orbits and scalp soft tissues are within normal limits.  ASPECTS [facility] Stroke Program Early CT Score) Total score (0-10 with 10 being normal): 10 (suspected subacute or chronic cortical infarcts in the right hemisphere). IMPRESSION: 1. Subacute versus chronic appearing cortical infarcts in the right perirolandic and lateral right occipital regions are new since 2020. 2. No superimposed acute cortically based infarct or acute intracranial hemorrhage identified. ASPECTS 10. Electronically Signed   By: Genevie Ann M.D.   On: 06/24/2021 06:07    ____________________________________________   PROCEDURES  Procedure(s) performed (including Critical Care):  Procedures  NIH Stroke Scale  Interval: Baseline Time: 6:26 AM Person Administering Scale: Quin Mathenia J  Administer stroke scale items in the order listed. Record performance in each category after each subscale exam. Do not go back and change scores. Follow directions provided for each exam technique. Scores should reflect what the patient does, not what the clinician thinks the patient can do. The clinician should record answers while administering the exam and work quickly. Except where indicated, the patient should not be coached (i.e., repeated requests to patient to make a special effort).   1a  Level of consciousness: 0=alert; keenly responsive  1b. LOC questions:  0=Performs both tasks correctly  1c. LOC commands: 0=Performs both tasks correctly  2.  Best Gaze: 0=normal  3.  Visual: 0=No visual loss  4. Facial Palsy: 2=Partial paralysis (total or near total paralysis of the lower face)  5a.  Motor left arm: 2=Some effort against gravity, limb cannot get to or maintain (if cured) 90 (or 45) degrees, drifts down to bed, but has some effort against gravity  5b.  Motor right arm: 0=No drift, limb holds 90 (or 45) degrees for full 10 seconds  6a. motor left leg: 0=No drift, limb holds 90 (or 45) degrees for full 10 seconds  6b  Motor right leg:  0=No drift, limb holds 90 (or 45) degrees for full  10 seconds  7. Limb Ataxia: 0=Absent  8.  Sensory: 0=Normal; no sensory loss  9. Best Language:  0=No aphasia, normal  10. Dysarthria: 1=Mild to moderate, patient slurs at least some words and at worst, can be understood with some difficulty  11. Extinction and Inattention: 0=No abnormality  12. Distal motor function: 0=Normal   Total:   5    CRITICAL CARE Performed by: Paulette Blanch   Total critical care time: 30 minutes  Critical care time was exclusive of separately billable procedures and treating other patients.  Critical care was necessary to treat or prevent imminent or life-threatening deterioration.  Critical care was time spent personally by me on the following activities: development of treatment plan with patient and/or surrogate as well as nursing, discussions with consultants, evaluation of patient's response to treatment, examination of patient, obtaining history from patient or surrogate, ordering and performing treatments and interventions, ordering and review of laboratory studies, ordering and review of radiographic studies, pulse oximetry and re-evaluation of patient's condition.  ____________________________________________   INITIAL IMPRESSION / ASSESSMENT AND PLAN / ED COURSE  As part of my medical decision making, I reviewed the following data within the [place] notes reviewed and incorporated, Labs reviewed, EKG interpreted, Old chart reviewed, Radiograph reviewed, A consult was requested and obtained from this/these consultant(s) Neurology, and Notes  from prior ED visits     50 year old female presenting with facial droop, left-sided weakness, right-sided headache, dysarthria.  Differential diagnosis includes but is not limited to CVA, TIA, ICH, encephalopathy, infectious, metabolic etiologies, etc.  Code stroke called immediately upon patient's arrival to the ED.  She was sent urgently for CT head.  Will obtain lab work, imaging  studies, consult teleneurology.  Last well-known at midnight, patient is most likely out of the window for tPA.  Anticipate hospitalization.  Clinical Course as of 06/24/21 0626  Sat Jun 24, 2021  1282 Patient evaluated by teleneurology who does not recommend tPA; recommends admission for stroke work-up. [JS]  0620 Spoke with Dr. Nevada Crane from radiology regarding patient's CT head. [JS]    Clinical Course User Index [JS] Paulette Blanch, MD     ____________________________________________   FINAL CLINICAL IMPRESSION(S) / ED DIAGNOSES  Final diagnoses:  Cerebrovascular accident (CVA), unspecified mechanism ([place])  Hyperglycemia  Hemiparesis, unspecified hemiparesis etiology, unspecified laterality [facility])     ED Discharge Orders     None        Note:  This document was prepared using Dragon voice recognition software and may include unintentional dictation errors.    Paulette Blanch, MD 06/24/21 [phone]

## 2021-06-24 NOTE — ED Triage Notes (Signed)
Pt presents to the ER as a Code Stroke. Per EMS, pt had a unwitnessed fall and fell at midnight. After fall pt began having slurred speech and left facial droop.

## 2021-06-24 NOTE — ED Notes (Signed)
Patient transported to CT via RN

## 2021-06-24 NOTE — Evaluation (Addendum)
Clinical/Bedside Swallow Evaluation Patient Details  Name: Erika [name] MRN: [id number] Date of Birth: 1971-09-10  Today's Date: 06/24/2021 Time: SLP Start Time (ACUTE ONLY): 1045 SLP Stop Time (ACUTE ONLY): 1140 SLP Time Calculation (min) (ACUTE ONLY): 55 min  Past Medical History:  Past Medical History:  Diagnosis Date   Arthralgia of temporomandibular joint    CAD, multiple vessel    a. 06/2016 Cath: ostLM 40%, ostLAD 40%, pLAD 95%, ost-pLCx 60%, pLCx 95%, mLCx 60%, mRCA 95%, D2 50%, LVSF nl;  b. 07/2016 CABG x 4 (LIMA->LAD, VG->Diag, VG->OM, VG->RCA); c. 08/2016 Cath: 3VD w/ 4/4 patent grafts. LAD distal to LIMA has diff dzs->Med rx; d. 08/2020 Cath: 4/4 patent grafts, native 3VD. EF 55-65%-->Med Rx.   Carotid arterial disease ([place])    a. 07/2016 s/p R CEA; b. 02/2021 U/S: RICA 56-38%, LICA 7-56%.   Clotting disorder ([place])    Depression    Diastolic dysfunction    a. 06/2016 Echo: EF 50-55%, mild inf wall HK, GR1DD, mild MR, RV sys fxn nl, mildly dilated LA, PASP nl   Fatty liver disease, nonalcoholic 4332   History of blood transfusion    with heart surgery   HLD (hyperlipidemia)    Labile hypertension    a. prior renal ngiogram negative for RAS in 03/2016; b. catecholamines and metanephrines normal, mildly elevated renin with normal aldosterone and normal ratio in 02/2016   Myocardial infarction [facility]) 2017   Obesity    PAD (peripheral artery disease) ([place])    a. 09/2018 s/p L SFA stenting; b. 07/2019 Periph Angio: Patent m/d L SFA stent w/ 100% L SFA distal to stent. L AT 100d, L Peroneal diff dzs-->Med Rx; c. 02/2021 ABIs: stable @ 0.61 on R and 0.46 on L.   PTSD (post-traumatic stress disorder)    Tobacco abuse    Type 2 diabetes mellitus ([place]) 12/2015   Past Surgical History:  Past Surgical History:  Procedure Laterality Date   ABDOMINAL AORTOGRAM W/LOWER EXTREMITY [name] 10/15/2018   Procedure: ABDOMINAL AORTOGRAM W/LOWER EXTREMITY;  Surgeon: Wellington Hampshire, MD;  Location:  [place] [facility];  Service: Cardiovascular;  Laterality: [name];   ABDOMINAL AORTOGRAM W/LOWER EXTREMITY Bilateral 08/19/2019   Procedure: ABDOMINAL AORTOGRAM W/LOWER EXTREMITY;  Surgeon: Wellington Hampshire, MD;  Location: [place] [facility];  Service: Cardiovascular;  Laterality: Bilateral;   CARDIAC CATHETERIZATION [name] 06/29/2016   Procedure: Left Heart Cath and Coronary Angiography;  Surgeon: Minna Merritts, MD;  Location: [place] [facility];  Service: Cardiovascular;  Laterality: [name];   CARDIAC CATHETERIZATION [name] 08/29/2016   Procedure: Left Heart Cath and Cors/Grafts Angiography;  Surgeon: Wellington Hampshire, MD;  Location: [place] [facility];  Service: Cardiovascular;  Laterality: [name];   CESAREAN SECTION     CHOLECYSTECTOMY     CORONARY ARTERY BYPASS GRAFT [name] 07/06/2016   Procedure: CORONARY ARTERY BYPASS GRAFTING (CABG) x four, using left internal mammary artery and right leg greater saphenous vein harvested endoscopically;  Surgeon: Ivin Poot, MD;  Location: [place];  Service: Open Heart Surgery;  Laterality: [name];   ENDARTERECTOMY Right 07/06/2016   Procedure: ENDARTERECTOMY CAROTID;  Surgeon: Rosetta Posner, MD;  Location: [facility] OR;  Service: Vascular;  Laterality: Right;   ENDARTERECTOMY Right 04/27/2020   Procedure: REDO OF RIGHT ENDARTERECTOMY CAROTID;  Surgeon: Rosetta Posner, MD;  Location: [facility] OR;  Service: Vascular;  Laterality: Right;   LEFT HEART CATH AND CORS/GRAFTS ANGIOGRAPHY [name] 08/24/2020   Procedure: LEFT HEART CATH AND  CORS/GRAFTS ANGIOGRAPHY;  Surgeon: Wellington Hampshire, MD;  Location: [place] [facility];  Service: Cardiovascular;  Laterality: [name];   PERIPHERAL VASCULAR CATHETERIZATION [name] 04/18/2016   Procedure: Renal Angiography;  Surgeon: Wellington Hampshire, MD;  Location: [place] [facility];  Service: Cardiovascular;  Laterality: [name];   PERIPHERAL VASCULAR INTERVENTION Left 10/15/2018   Procedure: PERIPHERAL VASCULAR INTERVENTION;  Surgeon: Wellington Hampshire, MD;  Location: [place] [facility];  Service: Cardiovascular;  Laterality: Left;  Left superficial femoral   TEE WITHOUT CARDIOVERSION [name] 07/06/2016   Procedure: TRANSESOPHAGEAL ECHOCARDIOGRAM (TEE);  Surgeon: Ivin Poot, MD;  Location: [place];  Service: Open Heart Surgery;  Laterality: [name];   TONSILLECTOMY     HPI:  Pt is a 50 y.o. female with medical history significant of HTN, HLD, DM, tobacco use/abuse, PAD, CAD, CABG, dCHF, foot drop, renal artery stenosis, carotid artery stenosis (s/p of right CEA 2017), PTSD, Adjustment Disorder, who presents with slurred speech, left facial droop, left arm weakness.   Pt states that she was walking to the bathroom in the middle night, and her legs gave away and fell.  No loss of consciousness.  No significant injury.  After that, patient had developed slurred speech, left facial droop and left arm weakness.  Patient has chronic left foot drop, no leg weakness or numbness.  Patient has some mild dry cough, denies chest pain or shortness of breath.  Patient was found to have oxygen desaturation to 89% by EMS and started 5 L oxygen with oxygen saturation 95%.   HEAD CT: "Abnormal cortical and subcortical white matter hypodensity in  both the superior right perirolandic cortex (series 3, image 23) and  lateral right occipital pole (image 17) are new since 2020, but  appears subacute versus chronic. Possible petechial hemorrhage  associated with the latter.  No superimposed acute cortically based infarct identified. Elsewhere  gray-white matter differentiation is within normal limits.  No midline shift, ventriculomegaly, mass effect, evidence of mass  lesion, or other intracranial hemorrhage.".  MRI pending today.  CXR - basilar atelectasis.  Cardiology following now.   Assessment / Plan / Recommendation Clinical Impression  Pt appears to present w/ mild+ oropharyngeal phase dysphagia w/ suspected Neuromuscular impact; L orofacial weakness w/ Mod Dysarthria noted. MRI pending per Code  Stroke. Any oral motor weakness and/or decreased sensation can impact overall awareness/control during po tasks and oropharyngeal swallowing which increases risk for aspiration. Pt fed self w/ setup support but exhibited Mod+ LUE weakness and inability to use LUE for self-feeding. Pt was A/Ox4 and followed all instruction but frustrated by her deficits.  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  Pt consumed trials of single ice chips, thin liquids via Cup, purees, and minced/moistened solids w/ No immediate, overt clinical s/s of aspiration noted -- no cough and no decline in respiratory presentation noted during/post trials. O2 sats remained her Baseline (~90-92% -- low O2 sats Baseline was reported to NSG as pt was not on any O2 support). Pt was instructed on, and gave attention to, po tasks especially using Single, Small Sips of thin liquids VIA CUP to lessen risk for aspiration, choking. Discussed her particular risks d/t evident L orofacial weakness, neurological deficit. Oral phase was c/b slow, deliberate bolus management and oral clearing of increased textured food trials especially. She required increased Time for mashing/gumming of soft solids(minced) and A-P transfer. Instructed pt on use of lingual sweep, f/u swallow, and  Time b/t trials to fully clear solid foods -- she appeared to orally manage po's adequate following instructions. OM exam revealed Left unilateral lingual and labial-facial weakness w/ decreased BOT sensation on Left posterior; Gag reflex + on R side. No anterior leakage noted during liquid trials.  Recommend initiation of dysphagia level 2 diet(minced) w/ moistened foods; Thin liquids VIA CUP ONLY; strict aspiration precautions as discussed; Pills Crushed vs Whole in puree for safety; feeding support and supervision at meals for any increased s/s of aspiration, reduce Distractions during meals and talking. NSG/MD updated. ST services will f/u w/  toleration of diet; ongoing assessments. SLP Visit Diagnosis: Dysphagia, oropharyngeal phase (R13.12)    Aspiration Risk  Mild aspiration risk;Moderate aspiration risk;Risk for inadequate nutrition/hydration (currently)    Diet Recommendation  Dysphagia level 2 diet(minced) w/ moistened foods; Thin liquids VIA CUP ONLY; strict aspiration precautions as discussed; feeding support and supervision at meals for any increased s/s of aspiration, reduce Distractions during meals and talking during oral intake.   Medication Administration: Crushed with puree    Other  Recommendations Recommended Consults:  (Dietician f/u) Oral Care Recommendations: Oral care BID;Oral care before and after PO;Staff/trained caregiver to provide oral care (support pt) Other Recommendations:  (n/a currently)   Follow up Recommendations Inpatient Rehab (for dysphagia, dysarthria)      Frequency and Duration min 3x week  2 weeks       Prognosis Prognosis for Safe Diet Advancement: Good Barriers to Reach Goals: Time post onset;Severity of deficits;Motivation      Swallow Study   General Date of Onset: 06/24/21 HPI: Pt is a 50 y.o. female with medical history significant of HTN, HLD, DM, tobacco use/abuse, PAD, CAD, CABG, dCHF, foot drop, renal artery stenosis, carotid artery stenosis (s/p of right CEA 2017), PTSD, Adjustment Disorder, who presents with slurred speech, left facial droop, left arm weakness.   Pt states that she was walking to the bathroom in the middle night, and her legs gave away and fell.  No loss of consciousness.  No significant injury.  After that, patient had developed slurred speech, left facial droop and left arm weakness.  Patient has chronic left foot drop, no leg weakness or numbness.  Patient has some mild dry cough, denies chest pain or shortness of breath.  Patient was found to have oxygen desaturation to 89% by EMS and started 5 L oxygen with oxygen saturation 95%.   HEAD CT: "Abnormal  cortical and subcortical white matter hypodensity in  both the superior right perirolandic cortex (series 3, image 23) and  lateral right occipital pole (image 17) are new since 2020, but  appears subacute versus chronic. Possible petechial hemorrhage  associated with the latter.     No superimposed acute cortically based infarct identified. Elsewhere  gray-white matter differentiation is within normal limits.     No midline shift, ventriculomegaly, mass effect, evidence of mass  lesion, or other intracranial hemorrhage.".  MRI pending today.  CXR - basilar atelectasis.  Cardiology following now. Type of Study: Bedside Swallow Evaluation Previous Swallow Assessment: none Diet Prior to this Study: NPO (NPO after failed [facility] screen) Temperature Spikes Noted: No (wbc 18.6 1x) Respiratory Status: Room air (had been on [place] 5L support w/ EMS/admit) History of Recent Intubation: No Behavior/Cognition: Alert;Cooperative;Pleasant mood Oral Cavity Assessment: Within Functional Limits Oral Care Completed by SLP: Yes Oral Cavity - Dentition: Edentulous (Dentures at home) Vision: Functional for self-feeding Self-Feeding Abilities: Able to feed self;Needs assist;Needs set up (using RUE)  Patient Positioning: Upright in bed (needed positioning support) Baseline Vocal Quality: Low vocal intensity (rushed, quick speech - edentulous w/ dysarthria also) Volitional Cough: Strong Volitional Swallow: Able to elicit    Oral/Motor/Sensory Function Overall Oral Motor/Sensory Function: Moderate impairment Facial ROM: Reduced left;Suspected CN VII (facial) dysfunction Facial Symmetry: Abnormal symmetry left;Suspected CN VII (facial) dysfunction Facial Strength: Reduced left;Suspected CN VII (facial) dysfunction Lingual ROM: Reduced left;Suspected CN XII (hypoglossal) dysfunction Lingual Symmetry: Abnormal symmetry left;Suspected CN XII (hypoglossal) dysfunction Lingual Strength:  (Fair-WFL in assessment) Lingual  Sensation: Reduced (reduced BOT and gag sensation on L side) Velum: Within Functional Limits (grossly) Mandible: Within Functional Limits   Ice Chips Ice chips: Within functional limits Presentation: Spoon (fed; 8 trials)   Thin Liquid Thin Liquid: Within functional limits Presentation: Cup;Self Fed (15+ trials)    Nectar Thick Nectar Thick Liquid: Not tested   Honey Thick Honey Thick Liquid: Not tested   Puree Puree: Within functional limits Presentation: Spoon;Self Fed (supported 10 trials)   Solid     Solid: Impaired Presentation: Spoon;Self Fed (5 trials) Oral Phase Impairments: Impaired mastication;Reduced lingual movement/coordination (min - edentulous also) Oral Phase Functional Implications: Impaired mastication (min increased time) Pharyngeal Phase Impairments:  (none)        Orinda Kenner, MS, [employer] Speech Language Pathologist Rehab Services [phone] [facility] 06/24/2021,3:04 PM

## 2021-06-24 NOTE — ED Notes (Signed)
Pt requested to walk. RN helped pt walk with walker. Pt steady on feet with one assist.

## 2021-06-24 NOTE — Consult Note (Addendum)
Cardiology Consult    Patient ID: TIMEKA GOETTE MRN: [id number], DOB/AGE: Aug 12, 1971   Admit date: 06/24/2021 Date of Consult: 06/24/2021  Primary Physician: Lesleigh Noe, MD Primary Cardiologist: [name], MD Requesting Provider: Mora Bellman, MD  Patient Profile    MIKENNA BUNKLEY is a 50 y.o. female with a history of CAD status post CABG, carotid arterial disease status post right carotid endarterectomy, peripheral arterial disease status post prior left SFA stenting with subsequent to distal SFA occlusion, HFpEF, hypertension, hyperlipidemia, obesity, diabetes, and tobacco abuse, who is being seen today for the evaluation of elevated troponin in the setting of acute stroke at the request of Dr. Blaine Hamper.  Past Medical History   Past Medical History:  Diagnosis Date   Arthralgia of temporomandibular joint    CAD, multiple vessel    a. 06/2016 Cath: ostLM 40%, ostLAD 40%, pLAD 95%, ost-pLCx 60%, pLCx 95%, mLCx 60%, mRCA 95%, D2 50%, LVSF nl;  b. 07/2016 CABG x 4 (LIMA->LAD, VG->Diag, VG->OM, VG->RCA); c. 08/2016 Cath: 3VD w/ 4/4 patent grafts. LAD distal to LIMA has diff dzs->Med rx; d. 08/2020 Cath: 4/4 patent grafts, native 3VD. EF 55-65%-->Med Rx.   Carotid arterial disease ([place])    a. 07/2016 s/p R CEA; b. 02/2021 U/S: RICA 29-79%, LICA 8-92%.   Clotting disorder ([place])    Depression    Diastolic dysfunction    a. 06/2016 Echo: EF 50-55%, mild inf wall HK, GR1DD, mild MR, RV sys fxn nl, mildly dilated LA, PASP nl   Fatty liver disease, nonalcoholic 1194   History of blood transfusion    with heart surgery   HLD (hyperlipidemia)    Labile hypertension    a. prior renal ngiogram negative for RAS in 03/2016; b. catecholamines and metanephrines normal, mildly elevated renin with normal aldosterone and normal ratio in 02/2016   Myocardial infarction [facility]) 2017   Obesity    PAD (peripheral artery disease) ([place])    a. 09/2018 s/p L SFA stenting; b. 07/2019 Periph Angio: Patent m/d L  SFA stent w/ 100% L SFA distal to stent. L AT 100d, L Peroneal diff dzs-->Med Rx; c. 02/2021 ABIs: stable @ 0.61 on R and 0.46 on L.   PTSD (post-traumatic stress disorder)    Tobacco abuse    Type 2 diabetes mellitus ([place]) 12/2015    Past Surgical History:  Procedure Laterality Date   ABDOMINAL AORTOGRAM W/LOWER EXTREMITY [name] 10/15/2018   Procedure: ABDOMINAL AORTOGRAM W/LOWER EXTREMITY;  Surgeon: Wellington Hampshire, MD;  Location: [place] [facility];  Service: Cardiovascular;  Laterality: [name];   ABDOMINAL AORTOGRAM W/LOWER EXTREMITY Bilateral 08/19/2019   Procedure: ABDOMINAL AORTOGRAM W/LOWER EXTREMITY;  Surgeon: Wellington Hampshire, MD;  Location: [place] [facility];  Service: Cardiovascular;  Laterality: Bilateral;   CARDIAC CATHETERIZATION [name] 06/29/2016   Procedure: Left Heart Cath and Coronary Angiography;  Surgeon: Minna Merritts, MD;  Location: [place] [facility];  Service: Cardiovascular;  Laterality: [name];   CARDIAC CATHETERIZATION [name] 08/29/2016   Procedure: Left Heart Cath and Cors/Grafts Angiography;  Surgeon: Wellington Hampshire, MD;  Location: [place] [facility];  Service: Cardiovascular;  Laterality: [name];   CESAREAN SECTION     CHOLECYSTECTOMY     CORONARY ARTERY BYPASS GRAFT [name] 07/06/2016   Procedure: CORONARY ARTERY BYPASS GRAFTING (CABG) x four, using left internal mammary artery and right leg greater saphenous vein harvested endoscopically;  Surgeon: Ivin Poot, MD;  Location: [place];  Service: Open Heart Surgery;  Laterality: [name];   ENDARTERECTOMY Right 07/06/2016   Procedure: ENDARTERECTOMY CAROTID;  Surgeon: Rosetta Posner, MD;  Location: [facility] OR;  Service: Vascular;  Laterality: Right;   ENDARTERECTOMY Right 04/27/2020   Procedure: REDO OF RIGHT ENDARTERECTOMY CAROTID;  Surgeon: Rosetta Posner, MD;  Location: [facility] OR;  Service: Vascular;  Laterality: Right;   LEFT HEART CATH AND CORS/GRAFTS ANGIOGRAPHY [name] 08/24/2020   Procedure: LEFT HEART CATH AND CORS/GRAFTS ANGIOGRAPHY;  Surgeon:  Wellington Hampshire, MD;  Location: [place] [facility];  Service: Cardiovascular;  Laterality: [name];   PERIPHERAL VASCULAR CATHETERIZATION [name] 04/18/2016   Procedure: Renal Angiography;  Surgeon: Wellington Hampshire, MD;  Location: [place] [facility];  Service: Cardiovascular;  Laterality: [name];   PERIPHERAL VASCULAR INTERVENTION Left 10/15/2018   Procedure: PERIPHERAL VASCULAR INTERVENTION;  Surgeon: Wellington Hampshire, MD;  Location: [place] [facility];  Service: Cardiovascular;  Laterality: Left;  Left superficial femoral   TEE WITHOUT CARDIOVERSION [name] 07/06/2016   Procedure: TRANSESOPHAGEAL ECHOCARDIOGRAM (TEE);  Surgeon: Ivin Poot, MD;  Location: [place];  Service: Open Heart Surgery;  Laterality: [name];   TONSILLECTOMY      Allergies  Allergies  Allergen Reactions   Chantix [Varenicline Tartrate] Other (See Comments)    Feels "crazy" and angry     History of Present Illness    50 year old female with above complex past medical history including CAD, carotid arterial disease status post right carotid enterectomy, diastolic dysfunction, difficult to control hypertension, hyperlipidemia, obesity, diabetes, tobacco abuse, HFpEF, and peripheral arterial disease.  She suffered a non-STEMI in 2017 and was found to have three-vessel coronary artery disease.  She subsequently underwent CABG x3 as well as right carotid endarterectomy.  In September 2017, she was hospitalized with chest pain in the setting of uncontrolled hypertension.  Cath showed 4 of 4 patent grafts.  In October 2019, she underwent lower extremity angiography in the setting of bilateral claudication and was found to have significant proximal left SFA stenosis followed by a short occlusion in the mid to distal segment and three-vessel runoff below the knee.  She underwent successful stenting of the mid to distal left SFA as well as drug-coated balloon angioplasty of the proximal left SFA.  Unfortunately, she had worsening left lower  extremity claudication and in August 2020 underwent repeat angiography with finding of distal left SFA stenosis beyond the previously placed stent, as well as left anterior tibial stenosis, and diffuse disease in the left peroneal.  A walking program and medical therapy was recommended.  Her most recent diagnostic heart catheterization took place in September 2021 in the setting of worsening angina and again showed 4 4 patent grafts with severe native multivessel disease.  She has been medically managed since.  Most recent ABIs in March 2022 were stable with an ABI of 0.61 on the right and 0.46 on the left.  She was last seen in cardiology clinic in February of this year, at which time she continued to smoke and noted ongoing lower extremity claudication as well as difficult to control hypertension.  It was felt that she might be a candidate for renal denervation for management of blood pressure if the therapy became approved.  Ms. Bazar was in her usual state of health until the early morning hours of July 2, when she was walking into her bathroom and noted that her legs became weak and she fell.  Following this, she developed slurred speech, left facial droop, and left arm weakness.  EMS  was called and she was found to be oxygenating at 89% on room air was placed on oxygen at 5 L/min.  She taken to the ED where white count was elevated 18.6.  Troponin was elevated at 106 and subsequently 172.  ECG was without acute ST or T changes.  She was afebrile and normotensive.  Code stroke was activated and CT of the head was performed, and showed subacute versus chronic appearing cortical infarcts in the right perirolandic and lateral right occipital regions.  CTA of the chest was performed and was suboptimal to evaluate PE.  Patient has been seen by neurology and admitted by medicine for management of stroke.  We have been asked to evaluate secondary to elevated troponin.  She notes that she was doing well @ home  prior to admission w/o any recent episodes of chest pain or dyspnea. She has continued to smoke, but notes that she was cutting back.  Inpatient Medications     aspirin  81 mg Oral Daily   benztropine  0.5 mg Oral QHS   chlorproMAZINE  100 mg Oral QHS   clopidogrel  75 mg Oral Daily   enoxaparin (LOVENOX) injection  40 mg Subcutaneous Q24H   FLUoxetine  60 mg Oral Daily   insulin aspart  0-5 Units Subcutaneous QHS   insulin aspart  0-9 Units Subcutaneous TID WC   insulin glargine  45 Units Subcutaneous BID   isosorbide mononitrate  30-60 mg Oral BID   lamoTRIgine  200 mg Oral QHS   nicotine  21 mg Transdermal Daily   pregabalin  225 mg Oral BID   rosuvastatin  20 mg Oral Daily   spironolactone  25 mg Oral Daily   torsemide  20 mg Oral BID    Family History    Family History  Adopted: Yes  Problem Relation Age of Onset   Diabetes Mother    Diabetes Father    Alcohol abuse Father    Heart disease Father    Drug abuse Father    Stroke Sister    Anxiety disorder Sister    is adopted.    Social History    Social History   Socioeconomic History   Marital status: Married    Spouse name: Not on file   Number of children: 1   Years of education: 14   Highest education level: Not on file  Occupational History   Occupation: Disability  Tobacco Use   Smoking status: Every Day    Packs/day: 0.50    Years: 27.00    Pack years: 13.50    Types: Cigarettes   Smokeless tobacco: Never  Vaping Use   Vaping Use: Former  Substance and Sexual Activity   Alcohol use: Not Currently    Alcohol/week: 0.0 standard drinks    Comment: socially   Drug use: No   Sexual activity: Yes    Partners: Male    Birth control/protection: None  Other Topics Concern   Not on file  Social History Narrative   11/21/20   From: [name]   Living: with husband, Catalina Antigua (2009) and special needs daughter    Work: disability       Family: daughter - [name] (special needs, lives with  her) and 2 grown step children - [name] and [name]       Enjoys: play with dogs - 2 german Shepard, mut, and blue tick walker mix      Exercise: PAD limits exercise   Diet: diabetic diet and  low potassium due to daughter      Safety   Seat belts: Yes    Guns: Yes  and secure   Safe in relationships: Yes    Social Determinants of Health   Financial Resource Strain: Not on file  Food Insecurity: Not on file  Transportation Needs: Not on file  Physical Activity: Not on file  Stress: Not on file  Social Connections: Not on file  Intimate Partner Violence: Not on file     Review of Systems    General:  No chills, fever, night sweats or weight changes.  Cardiovascular:  No chest pain, dyspnea on exertion, edema, orthopnea, palpitations, paroxysmal nocturnal dyspnea. +++ bilat LE claudication. Dermatological: No rash, lesions/masses Respiratory: No cough, dyspnea Urologic: No hematuria, dysuria Abdominal:   No nausea, vomiting, diarrhea, bright red blood per rectum, melena, or hematemesis Neurologic:  No visual changes, +++ L sided wkns and garbled speech since early this AM.  No changes in mental status. All other systems reviewed and are otherwise negative except as noted above.  Physical Exam    Blood pressure (!) 144/77, pulse 100, temperature 98.2 F (36.8 C), temperature source Oral, resp. rate 18, height 5\' 7"  (1.702 m), weight 84.4 kg, SpO2 90 %.  General: Pleasant, NAD Psych: Flat affect. Neuro: Alert and oriented X 3. L facial droop.  LUE 2-3/5, LLE 3-4/5 HEENT: L facial droop. Speech somewhat garbled. Neck: Supple without bruits or JVD. Lungs:  Resp regular and unlabored, diminished breath sounds w/ faint exp wheezing. Heart: RRR no s3, s4, or murmurs. Abdomen: Soft, non-tender, non-distended, BS + x 4.  Extremities: No clubbing, cyanosis or edema.  Radials 2+ and equal bilaterally.  Diminished LE distal pulses.  Labs    Cardiac Enzymes Recent Labs  Lab  06/24/21 0552 06/24/21 0812  TROPONINIHS 106* 172*      Lab Results  Component Value Date   WBC 18.6 (H) 06/24/2021   HGB 15.4 (H) 06/24/2021   HCT 45.2 06/24/2021   MCV 89.2 06/24/2021   PLT 326 06/24/2021    Recent Labs  Lab 06/24/21 0540  NA 135  K 4.0  CL 98  CO2 25  BUN 15  CREATININE 0.80  CALCIUM 9.9  PROT 7.2  BILITOT 0.8  ALKPHOS 85  ALT 68*  AST 63*  GLUCOSE 306*   Lab Results  Component Value Date   CHOL 214 (H) 11/21/2020   HDL 42.80 11/21/2020   LDLCALC 80 09/07/2019   TRIG (H) 11/21/2020    488.0 Triglyceride is over 400; calculations on Lipids are invalid.   Lab Results  Component Value Date   DDIMER 0.42 06/27/2016    Radiology Studies    CT Angio Chest Pulmonary Embolism (PE) W or WO Contrast  Result Date: 06/24/2021 CLINICAL DATA:  Evaluate for pulmonary embolism. History of code stroke. EXAM: CT ANGIOGRAPHY CHEST WITH CONTRAST TECHNIQUE: Multidetector CT imaging of the chest was performed using the standard protocol during bolus administration of intravenous contrast. Multiplanar CT image reconstructions and MIPs were obtained to evaluate the vascular anatomy. CONTRAST:  91mL OMNIPAQUE IOHEXOL 350 MG/ML SOLN COMPARISON:  Chest CT 06/28/2016 FINDINGS: Cardiovascular: Suboptimal evaluation of the pulmonary arteries. Large amount of contrast is already in the aorta. Bilateral main pulmonary arteries are patent but cannot evaluate beyond the main pulmonary arteries. Atherosclerotic disease in the thoracic aorta, post CABG. Calcifications in the native coronary arteries. Great vessels are patent. Atherosclerotic plaque involving the right common carotid artery and innominate artery.  Normal caliber of the thoracic aorta. Mediastinum/Nodes: Postsurgical changes from median sternotomy and CABG. No significant lymph node enlargement. Lungs/Pleura: Trachea and mainstem bronchi are patent. No large pleural effusions. Patchy densities in the right upper lobe. 5 mm  nodule in the superior segment of the right lower lobe has minimally changed since 2017 and likely benign. zPeripheral densities in the posterior right lower lobe are most compatible with atelectasis. Peripheral nodule in the posterior left lower lobe on sequence 6 image 49 appears stable. Upper Abdomen: Diffuse low density of the liver is compatible with steatosis. Musculoskeletal: No acute bone abnormality. Review of the MIP images confirms the above findings. IMPRESSION: 1. Suboptimal evaluation for pulmonary embolism. Pulmonary emboli cannot be confidently evaluated on this examination. 2. Patchy densities in the right upper lobe. Findings are nonspecific and could be related to areas of atelectasis versus mild infection. 3. Nodule in the superior segment of the right lower lobe has minimally changed since 2017 and likely benign. 4. Hepatic steatosis. 5.  Aortic Atherosclerosis (ICD10-I70.0). Electronically Signed   By: Markus Daft M.D.   On: 06/24/2021 10:55   CT CEREBRAL PERFUSION W CONTRAST  Result Date: 06/24/2021 CLINICAL DATA:  Slurred speech, left facial droop, left arm weakness, history of right CEA 2017 EXAM: CT ANGIOGRAPHY HEAD AND NECK CT PERFUSION BRAIN TECHNIQUE: Multidetector CT imaging of the head and neck was performed using the standard protocol during bolus administration of intravenous contrast. Multiplanar CT image reconstructions and MIPs were obtained to evaluate the vascular anatomy. Carotid stenosis measurements (when applicable) are obtained utilizing NASCET criteria, using the distal internal carotid diameter as the denominator. Multiphase CT imaging of the brain was performed following IV bolus contrast injection. Subsequent parametric perfusion maps were calculated using RAPID software. CONTRAST:  144mL OMNIPAQUE IOHEXOL 350 MG/ML SOLN COMPARISON:  CT head earlier same day, CTA 04/08/2020 FINDINGS: CT HEAD FINDINGS Brain: Stable appearance of areas of hypoattenuation and loss of  gray-white differentiation in the right parietal lobe and right occipital lobe. As before, there is possible petechial hemorrhage associated with the occipital infarct. No new hemorrhage or mass effect. Ventricles are stable in size. Vascular: No new findings. Skull: No new findings. Sinuses/Orbits: No new findings. Other: None. CTA NECK FINDINGS Aortic arch: Stable appearance of plaque at the great vessel origins. There is direct origin of the left vertebral artery from the arch. Right carotid system: Circumferential noncalcified plaque along the common carotid is decreased. Decreased plaque at the bifurcation and proximal ICA with improved stenosis post endarterectomy. Left carotid system: Patent. Mixed plaque along the common carotid, bifurcation, and proximal internal carotid. Unchanged 50% stenosis of the proximal ICA. Vertebral arteries: Right vertebral artery is patent. There is unchanged severe stenosis at the origin of the left vertebral with minimal opacification of the P2 segment. Reconstitution is present at the C2-C3 level. Skeleton: Degenerative changes of the cervical spine. Other neck: No acute abnormality. Upper chest: No apical lung mass. Review of the MIP images confirms the above findings CTA HEAD FINDINGS Anterior circulation: Intracranial internal carotid arteries are patent with mixed plaque causing moderate stenosis. Anterior and middle cerebral arteries are patent. Posterior circulation: Intracranial vertebral arteries are patent with diffuse atherosclerotic irregularity. Left vertebral artery is no longer well seen beyond PICA origin. Basilar artery is patent. Posterior cerebral arteries are patent. Bilateral posterior communicating arteries are present. Venous sinuses: As permitted by contrast timing, patent. Review of the MIP images confirms the above findings CT Brain Perfusion Findings: CBF (<30%) Volume:  63mL Perfusion (Tmax>6.0s) volume: 70mL Mismatch Volume: 46mL Infarction Location:  None. IMPRESSION: Stable appearance of the brain.  No new intracranial hemorrhage. Perfusion imaging demonstrates no evidence of core infarction or penumbra. Less stringent criteria demonstrates elevation of T-max corresponding to right occipital infarct, which is probably subacute with petechial hemorrhage. The right parietal infarct is more likely subacute to chronic. Decreased stenosis and plaque of the distal right common carotid and proximal right internal carotid reflecting interval endarterectomy. Intracranial left vertebral artery is now no longer well seen beyond the PICA origin. Remains high-grade stenosis of the left vertebral origin with reconstitution distally in the neck. Remain significant atherosclerotic irregularity of the proximal left intracranial portion with high-grade stenosis likely accounting for loss of distal flow. Electronically Signed   By: Macy Mis M.D.   On: 06/24/2021 13:06   DG Chest Port 1 View  Result Date: 06/24/2021 CLINICAL DATA:  Code stroke. EXAM: PORTABLE CHEST 1 VIEW COMPARISON:  May 25 21 FINDINGS: Is 631 hours. Low lung volumes. Basilar atelectasis without focal consolidation or overt pulmonary edema. Interstitial markings are diffusely coarsened with chronic features. The cardiopericardial silhouette is within normal limits for size. Status post CABG. Telemetry leads overlie the chest. IMPRESSION: Low volume chest with basilar atelectasis. Electronically Signed   By: Misty Stanley M.D.   On: 06/24/2021 07:28   CT HEAD CODE STROKE WO CONTRAST  Addendum Date: 06/24/2021   ADDENDUM REPORT: 06/24/2021 06:27 ADDENDUM: Study discussed by telephone with Dr. Luvenia Starch SUNG on 06/24/2021 at 0619 hours. Electronically Signed   By: Genevie Ann M.D.   On: 06/24/2021 06:27   Result Date: 06/24/2021 CLINICAL DATA:  Code stroke. 49 year old female with left side weakness. Last known well midnight. History of right carotid endarterectomy. EXAM: CT HEAD WITHOUT CONTRAST TECHNIQUE:  Contiguous axial images were obtained from the base of the skull through the vertex without intravenous contrast. COMPARISON:  Brain MRI 12/03/2012.  Head CT 02/25/2019. FINDINGS: Brain: Abnormal cortical and subcortical white matter hypodensity in both the superior right perirolandic cortex (series 3, image 23) and lateral right occipital pole (image 17) are new since 2020, but appears subacute versus chronic. Possible petechial hemorrhage associated with the latter. No superimposed acute cortically based infarct identified. Elsewhere gray-white matter differentiation is within normal limits. No midline shift, ventriculomegaly, mass effect, evidence of mass lesion, or other intracranial hemorrhage. Vascular: Calcified atherosclerosis at the skull base. No suspicious intracranial vascular hyperdensity. Skull: Negative. Sinuses/Orbits: Visualized paranasal sinuses and mastoids are clear. Other: Visualized orbits and scalp soft tissues are within normal limits. ASPECTS [facility] Stroke Program Early CT Score) Total score (0-10 with 10 being normal): 10 (suspected subacute or chronic cortical infarcts in the right hemisphere). IMPRESSION: 1. Subacute versus chronic appearing cortical infarcts in the right perirolandic and lateral right occipital regions are new since 2020. 2. No superimposed acute cortically based infarct or acute intracranial hemorrhage identified. ASPECTS 10. Electronically Signed: By: Genevie Ann M.D. On: 06/24/2021 06:07   CT ANGIO HEAD CODE STROKE  Result Date: 06/24/2021 CLINICAL DATA:  Slurred speech, left facial droop, left arm weakness, history of right CEA 2017 EXAM: CT ANGIOGRAPHY HEAD AND NECK CT PERFUSION BRAIN TECHNIQUE: Multidetector CT imaging of the head and neck was performed using the standard protocol during bolus administration of intravenous contrast. Multiplanar CT image reconstructions and MIPs were obtained to evaluate the vascular anatomy. Carotid stenosis measurements (when  applicable) are obtained utilizing NASCET criteria, using the distal internal carotid diameter as the denominator. Multiphase  CT imaging of the brain was performed following IV bolus contrast injection. Subsequent parametric perfusion maps were calculated using RAPID software. CONTRAST:  175mL OMNIPAQUE IOHEXOL 350 MG/ML SOLN COMPARISON:  CT head earlier same day, CTA 04/08/2020 FINDINGS: CT HEAD FINDINGS Brain: Stable appearance of areas of hypoattenuation and loss of gray-white differentiation in the right parietal lobe and right occipital lobe. As before, there is possible petechial hemorrhage associated with the occipital infarct. No new hemorrhage or mass effect. Ventricles are stable in size. Vascular: No new findings. Skull: No new findings. Sinuses/Orbits: No new findings. Other: None. CTA NECK FINDINGS Aortic arch: Stable appearance of plaque at the great vessel origins. There is direct origin of the left vertebral artery from the arch. Right carotid system: Circumferential noncalcified plaque along the common carotid is decreased. Decreased plaque at the bifurcation and proximal ICA with improved stenosis post endarterectomy. Left carotid system: Patent. Mixed plaque along the common carotid, bifurcation, and proximal internal carotid. Unchanged 50% stenosis of the proximal ICA. Vertebral arteries: Right vertebral artery is patent. There is unchanged severe stenosis at the origin of the left vertebral with minimal opacification of the P2 segment. Reconstitution is present at the C2-C3 level. Skeleton: Degenerative changes of the cervical spine. Other neck: No acute abnormality. Upper chest: No apical lung mass. Review of the MIP images confirms the above findings CTA HEAD FINDINGS Anterior circulation: Intracranial internal carotid arteries are patent with mixed plaque causing moderate stenosis. Anterior and middle cerebral arteries are patent. Posterior circulation: Intracranial vertebral arteries are  patent with diffuse atherosclerotic irregularity. Left vertebral artery is no longer well seen beyond PICA origin. Basilar artery is patent. Posterior cerebral arteries are patent. Bilateral posterior communicating arteries are present. Venous sinuses: As permitted by contrast timing, patent. Review of the MIP images confirms the above findings CT Brain Perfusion Findings: CBF (<30%) Volume: 32mL Perfusion (Tmax>6.0s) volume: 64mL Mismatch Volume: 20mL Infarction Location: None. IMPRESSION: Stable appearance of the brain.  No new intracranial hemorrhage. Perfusion imaging demonstrates no evidence of core infarction or penumbra. Less stringent criteria demonstrates elevation of T-max corresponding to right occipital infarct, which is probably subacute with petechial hemorrhage. The right parietal infarct is more likely subacute to chronic. Decreased stenosis and plaque of the distal right common carotid and proximal right internal carotid reflecting interval endarterectomy. Intracranial left vertebral artery is now no longer well seen beyond the PICA origin. Remains high-grade stenosis of the left vertebral origin with reconstitution distally in the neck. Remain significant atherosclerotic irregularity of the proximal left intracranial portion with high-grade stenosis likely accounting for loss of distal flow. Electronically Signed   By: Macy Mis M.D.   On: 06/24/2021 13:06   CT ANGIO NECK CODE STROKE  Result Date: 06/24/2021 CLINICAL DATA:  Slurred speech, left facial droop, left arm weakness, history of right CEA 2017 EXAM: CT ANGIOGRAPHY HEAD AND NECK CT PERFUSION BRAIN TECHNIQUE: Multidetector CT imaging of the head and neck was performed using the standard protocol during bolus administration of intravenous contrast. Multiplanar CT image reconstructions and MIPs were obtained to evaluate the vascular anatomy. Carotid stenosis measurements (when applicable) are obtained utilizing NASCET criteria, using the  distal internal carotid diameter as the denominator. Multiphase CT imaging of the brain was performed following IV bolus contrast injection. Subsequent parametric perfusion maps were calculated using RAPID software. CONTRAST:  172mL OMNIPAQUE IOHEXOL 350 MG/ML SOLN COMPARISON:  CT head earlier same day, CTA 04/08/2020 FINDINGS: CT HEAD FINDINGS Brain: Stable appearance of areas of hypoattenuation  and loss of gray-white differentiation in the right parietal lobe and right occipital lobe. As before, there is possible petechial hemorrhage associated with the occipital infarct. No new hemorrhage or mass effect. Ventricles are stable in size. Vascular: No new findings. Skull: No new findings. Sinuses/Orbits: No new findings. Other: None. CTA NECK FINDINGS Aortic arch: Stable appearance of plaque at the great vessel origins. There is direct origin of the left vertebral artery from the arch. Right carotid system: Circumferential noncalcified plaque along the common carotid is decreased. Decreased plaque at the bifurcation and proximal ICA with improved stenosis post endarterectomy. Left carotid system: Patent. Mixed plaque along the common carotid, bifurcation, and proximal internal carotid. Unchanged 50% stenosis of the proximal ICA. Vertebral arteries: Right vertebral artery is patent. There is unchanged severe stenosis at the origin of the left vertebral with minimal opacification of the P2 segment. Reconstitution is present at the C2-C3 level. Skeleton: Degenerative changes of the cervical spine. Other neck: No acute abnormality. Upper chest: No apical lung mass. Review of the MIP images confirms the above findings CTA HEAD FINDINGS Anterior circulation: Intracranial internal carotid arteries are patent with mixed plaque causing moderate stenosis. Anterior and middle cerebral arteries are patent. Posterior circulation: Intracranial vertebral arteries are patent with diffuse atherosclerotic irregularity. Left vertebral  artery is no longer well seen beyond PICA origin. Basilar artery is patent. Posterior cerebral arteries are patent. Bilateral posterior communicating arteries are present. Venous sinuses: As permitted by contrast timing, patent. Review of the MIP images confirms the above findings CT Brain Perfusion Findings: CBF (<30%) Volume: 26mL Perfusion (Tmax>6.0s) volume: 24mL Mismatch Volume: 87mL Infarction Location: None. IMPRESSION: Stable appearance of the brain.  No new intracranial hemorrhage. Perfusion imaging demonstrates no evidence of core infarction or penumbra. Less stringent criteria demonstrates elevation of T-max corresponding to right occipital infarct, which is probably subacute with petechial hemorrhage. The right parietal infarct is more likely subacute to chronic. Decreased stenosis and plaque of the distal right common carotid and proximal right internal carotid reflecting interval endarterectomy. Intracranial left vertebral artery is now no longer well seen beyond the PICA origin. Remains high-grade stenosis of the left vertebral origin with reconstitution distally in the neck. Remain significant atherosclerotic irregularity of the proximal left intracranial portion with high-grade stenosis likely accounting for loss of distal flow. Electronically Signed   By: Macy Mis M.D.   On: 06/24/2021 13:06    ECG & Cardiac Imaging    RSR, 97, LVH w/ repol abnormality, prior anterior infarct - personally reviewed.  Assessment & Plan    1.  Acute R hemispheric strokes:  Ss of left sided wkns, L facial droop, and garbled speech since early this AM, just after midnight.  CT head w/ subacute vs chronic appearing cortical infarcts in the R periolandic and lateral R occipital regions.  CTA head/neck w/ R occipital infarct w/ petechial hemorrhage (more likely subacute) and R parietal infarct (subacute vs chronic). Left vertebral artery stenosis noted.  No large vessel occlusions.  Seen by neuro.  Echo  pending.  2.  CAD/Elevated HsTrop:  pt w/ a h/o severe native multivessel CAD s/p CABG x 4 in 2017 w/ 4/4 patent grafts on cath in 08/2020.  She has not been having chest pain or dyspnea @ home.  In setting of above, HsTrop noted to be elevated @ 106  172.  ECG w/o acute ST/T changes.  HsTrop elevation is documented to occur in ~ 20% of stroke pts and has been associated w/higher  short and long term CV mortality risk.  HsTrop elevation is also assoc w/ more severe white matter lesions.  At this point, there is no evidence of ACS, thus demand ischemia is more likely.  Continue to trend HsTrop.  F/u echo to eval for rwma.  Futher recs pending echo.  Cont med rx w/ asa, plavix, statin, nitrate (once taking orals - failed SLP).  Home dose of carvedilol not currently ordered in setting of BPs in 140's and plan for permissive HTN over next 24 hrs.  3.  Essential HTN:  typically very difficult to control in the outpt setting w/ systolics freq in the 497'W to 190's.  Currently 140's.  Oral medications on hold in setting of CVA w/ failed swallow study.  Follow closely.  Can transition home dose of clonidine to patch if necessary and use prn IV hydralazine and/or labetalol if pressures rise significantly over next few days.  4.  HL:  Resume statin when taking POs.  Also on repatha and followed in our lipid clinic in [place].  5.  PAD:  chronic claudication w/ L>R LE dzs.  She has been medically managed w/ stable ABIs in 02/2021.  6.  Carotid arterial dzs:  stable dzs w/ patent R CEA on u/s in 02/2021 and CTA this afternoon.  7.  Hypoxia:  O2 sats in 80's upon EMS arrival.  CTA chest inadequate for eval of PE.  Hemodynamically stable w/ sats currently in the 90-95 range on RA.  Mild diffuse wheezing on exam - certainly a component of COPD in the setting of prolonged tob abuse may be playing a role.  8.  Tob Abuse:  still smoking 1/2 ppd.  Cessation advised.  9.  Leukocytosis:  Afebrile but WBC elevated @ 18.6.  CTA  chest w/ patchy RUL densities.  Abx initiated per IM.  Signed, Murray Hodgkins, NP 06/24/2021, 1:39 PM  For questions or updates, please contact   Please consult [email or web address] for contact info under Cardiology/STEMI.    I have seen, examined the patient, and reviewed the above assessment and plan.  Changes to above are made where necessary.  On exam, alert, standing in her room, RRR.  She presents with acute stroke.  She has no symptoms of MI.  She denies CP, SOB, or other cardiac complaints.  She has elevation in troponin as well as diffuse ST/T changes which are new. We will follow her closely while here.  No indication for acute cardiac interventions at this time.  Continue her home medicines.  Defer anticoagulation to neurology given her acute stroke.  Cardiology to follow with you  Co Sign: Thompson Grayer, MD 06/24/2021 3:34 PM

## 2021-06-24 NOTE — Consult Note (Signed)
[facility] Tele[facility]urology Consult Services   Date of Service:   06/24/2021 05:57:20  Diagnosis:       I63.9 - Cerebrovascular accident (CVA), unspecified mechanism ([place])  Impression:      50 y/o woman with h/o HTN, DM, hyperlipidemia and CAD who presents with left sided weakness. Last known well at 2130. Pending admission for stroke work-up  Metrics: Last Known Well: 06/23/2021 21:30:00 [facility] Notification Time: 06/24/2021 05:57:20 Arrival Time: 06/24/2021 05:49:00 Stamp Time: 06/24/2021 05:57:20 Initial Response Time: 06/24/2021 06:03:20 Symptoms: left sided . NIHSS Start Assessment Time: 06/24/2021 06:05:24 Patient is not a candidate for Thrombolytic. Thrombolytic Medical Decision: 06/24/2021 06:07:37 Patient was not deemed candidate for Thrombolytic because of following reasons: Last Well Known Above 4.5 Hours.  CT head showed no acute hemorrhage or acute core infarct.  ED Physician notified of diagnostic impression and management plan on 06/24/2021 06:13:38  Advanced Imaging: Advanced Imaging Not Recommended because:  Low clinical suspicion of LVO based on presentation   Our recommendations are outlined below.  Recommendations:        Stroke/Telemetry Floor       Neuro Checks       Bedside Swallow Eval       DVT Prophylaxis       IV Fluids, Normal Saline       Head of Bed 30 Degrees       Euglycemia and Avoid Hyperthermia (PRN Acetaminophen)       Antihypertensives PRN if Blood pressure is greater than 220/120 or there is a concern for End organ damage/contraindications for permissive HTN. If blood pressure is greater than 220/120 give labetalol PO or IV or Vasotec IV with a goal of 15% reduction in BP during the first 24 hours.       ASA       Covid testing       Patient advised to obtain Covid vaccination  Routine Consultation with [facility] for Follow up Care  Sign Out:       Discussed with Emergency Department  Provider    ------------------------------------------------------------------------------  History of Present Illness: Patient is a 50 year old Female.  Patient was brought by EMS for symptoms of left sided .  50 y/o woman with h/o HTN, DM, hyperlipidemia and CAD who presents with left sided weakness. Last known well at 2130. Patient woke up at 0000 with the symptoms. Emergent telestroke consult requested. CT brain reviewed and case discussed with ED attending. Patient is not vaccinated against Covid.   Anticoagulant use:  No  Antiplatelet use: Yes Plavix  Allergies:  NKDA    Examination: BP(123/90), Pulse(102), Blood Glucose(304) 1A: Level of Consciousness - Alert; keenly responsive + 0 1B: Ask Month and Age - Both Questions Right + 0 1C: Blink Eyes & Squeeze Hands - Performs Both Tasks + 0 2: Test Horizontal Extraocular Movements - Normal + 0 3: Test Visual Fields - No Visual Loss + 0 4: Test Facial Palsy (Use Grimace if Obtunded) - Minor paralysis (flat nasolabial fold, smile asymmetry) + 1 5A: Test Left Arm Motor Drift - Drift, but doesn't hit bed + 1 5B: Test Right Arm Motor Drift - No Drift for 10 Seconds + 0 6A: Test Left Leg Motor Drift - Drift, but doesn't hit bed + 1 6B: Test Right Leg Motor Drift - No Drift for 5 Seconds + 0 7: Test Limb Ataxia (FNF/Heel-Shin) - No Ataxia + 0 8: Test Sensation - Normal; No sensory loss + 0 9: Test Language/Aphasia -  Normal; No aphasia + 0 10: Test Dysarthria - Mild-Moderate Dysarthria: Slurring but can be understood + 1 11: Test Extinction/Inattention - No abnormality + 0  NIHSS Score: 4   Pre-Morbid Modified Rankin Scale: 0 Points = No symptoms at all   Patient/Family was informed the Neurology Consult would occur via TeleHealth consult by way of interactive audio and video telecommunications and consented to receiving care in this manner.   Patient is being evaluated for possible acute neurologic impairment and high  probability of imminent or life-threatening deterioration. I spent total of 15 minutes providing care to this patient, including time for face to face visit via telemedicine, review of medical records, imaging studies and discussion of findings with providers, the patient and/or family.   Dr Meryl Crutch   [facility] [phone]  Case [id number]

## 2021-06-24 NOTE — ED Notes (Signed)
ED Provider at bedside. 

## 2021-06-25 ENCOUNTER — Encounter: Payer: Self-pay | Admitting: Internal Medicine

## 2021-06-25 ENCOUNTER — Inpatient Hospital Stay (HOSPITAL_COMMUNITY)
Admit: 2021-06-25 | Discharge: 2021-06-25 | Disposition: A | Discharge status: Home / Self Care | Payer: Managed Care, Other (non HMO) | Attending: Internal Medicine | Admitting: Internal Medicine

## 2021-06-25 ENCOUNTER — Other Ambulatory Visit: Payer: Self-pay

## 2021-06-25 DIAGNOSIS — I6389 Other cerebral infarction: Secondary | ICD-10-CM

## 2021-06-25 DIAGNOSIS — R6520 Severe sepsis without septic shock: Secondary | ICD-10-CM

## 2021-06-25 LAB — ECHOCARDIOGRAM COMPLETE
AR max vel: 2.2 cm2
AV Area VTI: 2.57 cm2
AV Area mean vel: 2.44 cm2
AV Mean grad: 4 mmHg
AV Peak grad: 7.5 mmHg
Ao pk vel: 1.37 m/s
Height: 67 in
S' Lateral: 2.21 cm
Weight: 2976 oz

## 2021-06-25 LAB — LIPID PANEL
Cholesterol: 202 mg/dL — ABNORMAL HIGH (ref 0–200)
HDL: 29 mg/dL — ABNORMAL LOW (ref >40)
LDL Cholesterol: UNDETERMINED mg/dL (ref 0–99)
Total CHOL/HDL Ratio: 7 RATIO
Triglycerides: 636 mg/dL — ABNORMAL HIGH (ref <150)
VLDL: UNDETERMINED mg/dL (ref 0–40)

## 2021-06-25 LAB — GLUCOSE, CAPILLARY: Glucose-Capillary: 227 mg/dL — ABNORMAL HIGH (ref 70–99)

## 2021-06-25 LAB — HEPATIC FUNCTION PANEL
ALT: 59 U/L — ABNORMAL HIGH (ref 0–44)
AST: 52 U/L — ABNORMAL HIGH (ref 15–41)
Albumin: 3.6 g/dL (ref 3.5–5.0)
Alkaline Phosphatase: 74 U/L (ref 38–126)
Bilirubin, Direct: <0.1 mg/dL (ref 0.0–0.2)
Total Bilirubin: 0.6 mg/dL (ref 0.3–1.2)
Total Protein: 6.7 g/dL (ref 6.5–8.1)

## 2021-06-25 LAB — LDL CHOLESTEROL, DIRECT: Direct LDL: 99.6 mg/dL — ABNORMAL HIGH (ref 0–99)

## 2021-06-25 LAB — LACTIC ACID, PLASMA
Lactic Acid, Venous: 1.6 mmol/L (ref 0.5–1.9)
Lactic Acid, Venous: 2.2 mmol/L (ref 0.5–1.9)

## 2021-06-25 LAB — HIV ANTIBODY (ROUTINE TESTING W REFLEX): HIV Screen 4th Generation wRfx: NONREACTIVE

## 2021-06-25 LAB — CBG MONITORING, ED: Glucose-Capillary: 222 mg/dL — ABNORMAL HIGH (ref 70–99)

## 2021-06-25 LAB — TROPONIN I (HIGH SENSITIVITY): Troponin I (High Sensitivity): 150 ng/L (ref <18)

## 2021-06-25 MED ORDER — SODIUM CHLORIDE 0.9 % IV SOLN
3.0000 g | Freq: Four times a day (QID) | INTRAVENOUS | Status: DC
  Administered 2021-06-25 – 2021-06-28 (×10): 3 g via INTRAVENOUS
  Filled 2021-06-25 (×2): qty 3
  Filled 2021-06-25: qty 8
  Filled 2021-06-25 (×3): qty 3
  Filled 2021-06-25 (×3): qty 8
  Filled 2021-06-25: qty 3
  Filled 2021-06-25: qty 8
  Filled 2021-06-25: qty 3
  Filled 2021-06-25 (×4): qty 8
  Filled 2021-06-25: qty 3
  Filled 2021-06-25: qty 8

## 2021-06-25 MED ORDER — HYDRALAZINE HCL 20 MG/ML IJ SOLN: 5.0000 mg | INTRAMUSCULAR | Status: DC | PRN

## 2021-06-25 MED ORDER — DOXYCYCLINE HYCLATE 100 MG PO TABS: 100.0000 mg | ORAL_TABLET | Freq: Two times a day (BID) | ORAL | Status: DC

## 2021-06-25 MED ORDER — PERFLUTREN LIPID MICROSPHERE
1.0000 mL | INTRAVENOUS | Status: AC | PRN
  Administered 2021-06-25: 4 mL via INTRAVENOUS
  Filled 2021-06-25: qty 10

## 2021-06-25 NOTE — Hospital Course (Signed)
50 y.o. female with medical history significant of prior CVA in ___, HTN, HLD, DM, tobacco abuse, PAD, CAD, CABG, dCHF, peptic foot drop, renal artery stenosis, carotid artery stenosis (s/p of right CEA 2017), PTSD, adjustment disorder, who presented on 06/24/21 with slurred speech, left facial droop, left arm weakness.  She was found to have Right Pontine perforator ischemic infarction on MRI brain, in addition to chronic/prior Right Parietal and Occipital infarcts.  Neurology consulted.  Additional evaluation underway.  PT and OT are recommending CIR for rehab and patient is agreeable.

## 2021-06-25 NOTE — Progress Notes (Signed)
PROGRESS NOTE    Erika [name]   GUY:[id number]  DOB: 1971/07/25  PCP: Lesleigh Noe, MD    DOA: 06/24/2021 LOS: 1   Assessment & Plan   Principal Problem:   Stroke [facility]) Active Problems:   HLD (hyperlipidemia)   ADJUSTMENT DISORDER WITH MIXED FEATURES   MDD (major depressive disorder)   Elevated troponin   Tobacco abuse   Essential hypertension   Type 2 diabetes mellitus with other specified complication (HCC)   CAD (coronary artery disease)   Severe sepsis (HCC)   PVD (peripheral vascular disease) (HCC)   Chronic diastolic CHF (congestive heart failure) ([place])   Fall   Abnormal LFTs   Acute respiratory failure with hypoxia (HCC)   Acute Ischemic CVA of right pontine perforator -  Stroke risk factors: CAD, carotid stenosis, HLD, HTN, obesity, PAD, tobacco abuse and DM2 Neurology following - see their recs. PT and OT recommend CIR - consult underway. Telemetry. Plavix + ASA, Crestor BP control (outside window to need permHTN) Strict glycemic control Tobacco cessation Neuro checks  Severe sepsis due to RUL Pneumonia -  Acute respiratory failure with hypoxia and sepsis:  Presented with leukocytosis, tachycardia, RUL patchy density on CTA chest consistent with pneumonia. Lactici acidosis reflects organ dysfunction and severe sepsis. Started on empiric CAP coverage with Rocephin/Doxy on admission. --Change to Unasyn, suspect aspiration w/CVA --Follow blood and sputum cultures, urine antigen of Legionella and strep --IV maintenance fluid --follow up repeat LA  Cluster headaches - 100% O2 by NRB mask PRN  Cont home PO analgesics  HLD - Crestor.  On Repatha q14days, last dose on 6/26   ADJUSTMENT DISORDER WITH MIXED FEATURES and MDD (major depressive disorder): -Cogentin, Thorazine, Klonopin, Lamictal   Elevated troponin due to demand ischemia Hx of CAD: s/p of CABG.  No chest pain, likely due to demand ischemia [facility] cardiology consulted - Crestor,  plavix, ASA started per neuro   Tobacco abuse - Nicotine patch   Essential hypertension - normotensive off her meds today.  Hold antihypertensives for now.   PRN hydralazine for SBP>150 -Hold amlodipine, Coreg, clonidine, Cozaar to allow permissive hypertension   Type 2 diabetes mellitus with other specified complication - Recent D6L 11.5, poorly controlled.  Patient is taking Trulicity, Farxiga, glargine insulin -Sliding scale insulin -Decrease glargine dose from 120 to 90 unit daily -adjust insulin as needed   PVD - On Crestor and Plavix   Chronic diastolic CHF - currently compensated.  2D echo on 06/27/2016 showed EF of 50-55% with grade 1 diastolic dysfunction.   No pulmonary edema on chest x-ray.  BNP 33.3,  -hold torsemide and spironolactone   Fall due to weakness from CVA -PT/OT - CIR recommended, placement pending   Abnormal LFTs: mild -hepatis panel negative -Avoid using Tylenol     Patient BMI: Body mass index is 29.13 kg/m.   DVT prophylaxis: enoxaparin (LOVENOX) injection 40 mg Start: 06/24/21 2200   Diet:  Diet Orders (From admission, onward)     Start     Ordered   06/24/21 1142  DIET DYS 2 Room service appropriate? Yes with Assist; Fluid consistency: Thin  Diet effective now       Comments: NO STRAWS!!!   Extra Gravy on meats, potatoes.  Yogurt TID meals, pudding, Cream Soups -- NO Broth based soups right now.  Hot cereals w/ butter/sugar.  Magic Cup in place of ice cream right now.  Question Answer Comment  Room service appropriate? Yes with Assist  Fluid consistency: Thin      06/24/21 1149              Code Status: Full Code   Brief Narrative / Hospital Course to Date:   50 y.o. female with medical history significant of prior CVA in ___, HTN, HLD, DM, tobacco abuse, PAD, CAD, CABG, dCHF, peptic foot drop, renal artery stenosis, carotid artery stenosis (s/p of right CEA 2017), PTSD, adjustment disorder, who presented on 06/24/21 with slurred  speech, left facial droop, left arm weakness.  She was found to have Right Pontine perforator ischemic infarction on MRI brain, in addition to chronic/prior Right Parietal and Occipital infarcts.  Neurology consulted.  Additional evaluation underway.  PT and OT are recommending CIR for rehab and patient is agreeable.    Subjective 06/25/21    Pt seen in ED holding for bed. Husband at bedside.  She reports no improvement, some worsened LUE weakness today.  Speech has improved.  Eager about CIR and recovery.  Reports hx of cluster headaches, has one now 8 out of 10 severity. Requests oxygen NRB 100% as she uses at home.     Disposition Plan & Communication   Status is: Inpatient  Remains inpatient appropriate because:Ongoing diagnostic testing needed not appropriate for outpatient work up, CIR consult pending for rehab  Dispo: The patient is from: Home              Anticipated d/c is to: CIR              Patient currently is not medically stable to d/c.   Difficult to place patient No        Family Communication: husband at bedside on round    Consults, Procedures, Significant Events   Consultants:  neurology  Procedures:  none  Antimicrobials:  Anti-infectives (From admission, onward)    Start     Dose/Rate Route Frequency Ordered Stop   06/25/21 2200  doxycycline (VIBRA-TABS) tablet 100 mg  Status:  Discontinued        100 mg Oral Every 12 hours 06/25/21 1028 06/25/21 1202   06/25/21 1215  Ampicillin-Sulbactam (UNASYN) 3 g in sodium chloride 0.9 % 100 mL IVPB        3 g 200 mL/hr over 30 Minutes Intravenous Every 6 hours 06/25/21 1202     06/24/21 1145  cefTRIAXone (ROCEPHIN) 1 g in sodium chloride 0.9 % 100 mL IVPB  Status:  Discontinued        1 g 200 mL/hr over 30 Minutes Intravenous Daily 06/24/21 1136 06/25/21 1202   06/24/21 1145  doxycycline (VIBRAMYCIN) 100 mg in sodium chloride 0.9 % 250 mL IVPB  Status:  Discontinued        100 mg 125 mL/hr over 120  Minutes Intravenous 2 times daily 06/24/21 1136 06/25/21 1202         Micro    Objective   Vitals:   06/25/21 1000 06/25/21 1045 06/25/21 1655 06/25/21 1750  BP: (!) 161/87 (!) 152/74 (!) 157/91 (!) 182/93  Pulse: 98 99 91 89  Resp: 20 20 18 19   Temp:   98.6 F (37 C) 98.5 F (36.9 C)  TempSrc:   Oral Oral  SpO2: 97% 97% 96% 93%  Weight:      Height:        Intake/Output Summary (Last 24 hours) at 06/25/2021 1905 Last data filed at 06/25/2021 1830 Gross per 24 hour  Intake 590 ml  Output --  Net 590 ml  Filed Weights   06/24/21 0550  Weight: 84.4 kg    Physical Exam:  General exam: awake, alert, no acute distress HEENT: moist mucus membranes, hearing grossly normal  Respiratory system: CTAB, right-sided rhonchi, no wheezes, normal respiratory effort at rest Cardiovascular system: normal S1/S2, RRR, no pedal edema.   Central nervous system: A&O x4, dysarthric speech, L facial droop, LUE motor 2-3/5, LLE motor intact with plantar and dorsiflexion, hip flexion weak.  Skin: dry, intact, normal temperature Psychiatry: normal mood, congruent affect, judgement and insight appear normal  Labs   Data Reviewed: I have personally reviewed following labs and imaging studies  CBC: Recent Labs  Lab 06/24/21 0540  WBC 18.6*  NEUTROABS 15.7*  HGB 15.4*  HCT 45.2  MCV 89.2  PLT 174   Basic Metabolic Panel: Recent Labs  Lab 06/24/21 0540  NA 135  K 4.0  CL 98  CO2 25  GLUCOSE 306*  BUN 15  CREATININE 0.80  CALCIUM 9.9   GFR: Estimated Creatinine Clearance: 97 mL/min (by C-G formula based on SCr of 0.8 mg/dL). Liver Function Tests: Recent Labs  Lab 06/24/21 0540 06/25/21 0740  AST 63* 52*  ALT 68* 59*  ALKPHOS 85 74  BILITOT 0.8 0.6  PROT 7.2 6.7  ALBUMIN 3.8 3.6   No results for input(s): LIPASE, AMYLASE in the last 168 hours. No results for input(s): AMMONIA in the last 168 hours. Coagulation Profile: Recent Labs  Lab 06/24/21 0540  INR 1.0    Cardiac Enzymes: No results for input(s): CKTOTAL, CKMB, CKMBINDEX, TROPONINI in the last 168 hours. BNP (last 3 results) No results for input(s): PROBNP in the last 8760 hours. HbA1C: No results for input(s): HGBA1C in the last 72 hours. CBG: Recent Labs  Lab 06/24/21 0552 06/24/21 1640 06/24/21 2055 06/25/21 1245  GLUCAP 304* 202* 231* 222*   Lipid Profile: Recent Labs    06/25/21 0740  CHOL 202*  HDL 29*  LDLCALC UNABLE TO CALCULATE IF TRIGLYCERIDE OVER 400 mg/dL  TRIG 636*  CHOLHDL 7.0  LDLDIRECT 99.6*   Thyroid Function Tests: No results for input(s): TSH, T4TOTAL, FREET4, T3FREE, THYROIDAB in the last 72 hours. Anemia Panel: No results for input(s): VITAMINB12, FOLATE, FERRITIN, TIBC, IRON, RETICCTPCT in the last 72 hours. Sepsis Labs: Recent Labs  Lab 06/24/21 1305 06/25/21 0748 06/25/21 1111  PROCALCITON <0.10  --   --   LATICACIDVEN 2.5* 1.6 2.2*    Recent Results (from the past 240 hour(s))  Resp Panel by RT-PCR (Flu A&B, Covid) Nasopharyngeal Swab     Status: None   Collection Time: 06/24/21  6:30 AM   Specimen: Nasopharyngeal Swab; Nasopharyngeal(NP) swabs in vial transport medium  Result Value Ref Range Status   SARS Coronavirus 2 by RT PCR NEGATIVE NEGATIVE Final    Comment: (NOTE) SARS-CoV-2 target nucleic acids are NOT DETECTED.  The SARS-CoV-2 RNA is generally detectable in upper respiratory specimens during the acute phase of infection. The lowest concentration of SARS-CoV-2 viral copies this assay can detect is 138 copies/mL. A negative result does not preclude SARS-Cov-2 infection and should not be used as the sole basis for treatment or other patient management decisions. A negative result may occur with  improper specimen collection/handling, submission of specimen other than nasopharyngeal swab, presence of viral mutation(s) within the areas targeted by this assay, and inadequate number of viral copies(<138 copies/mL). A negative  result must be combined with clinical observations, patient history, and epidemiological information. The expected result is Negative.  Fact  Sheet for Patients:  EntrepreneurPulse.com.au  Fact Sheet for Healthcare Providers:  IncredibleEmployment.be  This test is no t yet approved or cleared by the [place] FDA and  has been authorized for detection and/or diagnosis of SARS-CoV-2 by FDA under an Emergency Use Authorization (EUA). This EUA will remain  in effect (meaning this test can be used) for the duration of the COVID-19 declaration under Section 564(b)(1) of the Act, 21 U.S.C.section 360bbb-3(b)(1), unless the authorization is terminated  or revoked sooner.       Influenza A by PCR NEGATIVE NEGATIVE Final   Influenza B by PCR NEGATIVE NEGATIVE Final    Comment: (NOTE) The Xpert Xpress SARS-CoV-2/FLU/RSV plus assay is intended as an aid in the diagnosis of influenza from Nasopharyngeal swab specimens and should not be used as a sole basis for treatment. Nasal washings and aspirates are unacceptable for Xpert Xpress SARS-CoV-2/FLU/RSV testing.  Fact Sheet for Patients: EntrepreneurPulse.com.au  Fact Sheet for Healthcare Providers: IncredibleEmployment.be  This test is not yet approved or cleared by the [place] FDA and has been authorized for detection and/or diagnosis of SARS-CoV-2 by FDA under an Emergency Use Authorization (EUA). This EUA will remain in effect (meaning this test can be used) for the duration of the COVID-19 declaration under Section 564(b)(1) of the Act, 21 U.S.C. section 360bbb-3(b)(1), unless the authorization is terminated or revoked.  Performed at [facility], [address] [place], [place] [address]       Imaging Studies   CT Angio Chest Pulmonary Embolism (PE) W or WO Contrast  Result Date: 06/24/2021 CLINICAL DATA:  Evaluate for pulmonary  embolism. History of code stroke. EXAM: CT ANGIOGRAPHY CHEST WITH CONTRAST TECHNIQUE: Multidetector CT imaging of the chest was performed using the standard protocol during bolus administration of intravenous contrast. Multiplanar CT image reconstructions and MIPs were obtained to evaluate the vascular anatomy. CONTRAST:  86mL OMNIPAQUE IOHEXOL 350 MG/ML SOLN COMPARISON:  Chest CT 06/28/2016 FINDINGS: Cardiovascular: Suboptimal evaluation of the pulmonary arteries. Large amount of contrast is already in the aorta. Bilateral main pulmonary arteries are patent but cannot evaluate beyond the main pulmonary arteries. Atherosclerotic disease in the thoracic aorta, post CABG. Calcifications in the native coronary arteries. Great vessels are patent. Atherosclerotic plaque involving the right common carotid artery and innominate artery. Normal caliber of the thoracic aorta. Mediastinum/Nodes: Postsurgical changes from median sternotomy and CABG. No significant lymph node enlargement. Lungs/Pleura: Trachea and mainstem bronchi are patent. No large pleural effusions. Patchy densities in the right upper lobe. 5 mm nodule in the superior segment of the right lower lobe has minimally changed since 2017 and likely benign. zPeripheral densities in the posterior right lower lobe are most compatible with atelectasis. Peripheral nodule in the posterior left lower lobe on sequence 6 image 49 appears stable. Upper Abdomen: Diffuse low density of the liver is compatible with steatosis. Musculoskeletal: No acute bone abnormality. Review of the MIP images confirms the above findings. IMPRESSION: 1. Suboptimal evaluation for pulmonary embolism. Pulmonary emboli cannot be confidently evaluated on this examination. 2. Patchy densities in the right upper lobe. Findings are nonspecific and could be related to areas of atelectasis versus mild infection. 3. Nodule in the superior segment of the right lower lobe has minimally changed since 2017  and likely benign. 4. Hepatic steatosis. 5.  Aortic Atherosclerosis (ICD10-I70.0). Electronically Signed   By: Markus Daft M.D.   On: 06/24/2021 10:55   MR BRAIN WO CONTRAST  Result Date: 06/24/2021 CLINICAL DATA:  Slurred  speech, left facial droop, left arm weakness, code stroke follow-up EXAM: MRI HEAD WITHOUT CONTRAST TECHNIQUE: Multiplanar, multiecho pulse sequences of the brain and surrounding structures were obtained without intravenous contrast. COMPARISON:  None. FINDINGS: Brain: There is reduced diffusion in the parasagittal right pons. The right parietal and occipital infarcts identified on prior CT imaging are chronic. There is associated T1 shortening reflecting laminar necrosis. This accounts for the hyperdensity seen on CT rather than hemorrhage. There is no intracranial mass or mass effect. Additional patchy foci of T2 hyperintensity in the supratentorial and pontine white matter are nonspecific but may reflect mild chronic microvascular ischemic changes. There are a few scattered foci of susceptibility hypointensity in the cerebral white matter, left basal ganglia, and right thalamus compatible with chronic microhemorrhages. There is no hydrocephalus or extra-axial fluid collection. Vascular: Major vessel flow voids at the skull base are preserved. Skull and upper cervical spine: Normal marrow signal is preserved. Sinuses/Orbits: Paranasal sinuses are aerated. Orbits are unremarkable. Other: Sella is unremarkable.  Mastoid air cells are clear. IMPRESSION: Acute right pontine perforator infarct. Right parietal and occipital infarcts are chronic. Mild chronic microvascular ischemic changes. Mild burden of scattered chronic microhemorrhages. Electronically Signed   By: Macy Mis M.D.   On: 06/24/2021 14:56   CT CEREBRAL PERFUSION W CONTRAST  Result Date: 06/24/2021 CLINICAL DATA:  Slurred speech, left facial droop, left arm weakness, history of right CEA 2017 EXAM: CT ANGIOGRAPHY HEAD AND NECK  CT PERFUSION BRAIN TECHNIQUE: Multidetector CT imaging of the head and neck was performed using the standard protocol during bolus administration of intravenous contrast. Multiplanar CT image reconstructions and MIPs were obtained to evaluate the vascular anatomy. Carotid stenosis measurements (when applicable) are obtained utilizing NASCET criteria, using the distal internal carotid diameter as the denominator. Multiphase CT imaging of the brain was performed following IV bolus contrast injection. Subsequent parametric perfusion maps were calculated using RAPID software. CONTRAST:  124mL OMNIPAQUE IOHEXOL 350 MG/ML SOLN COMPARISON:  CT head earlier same day, CTA 04/08/2020 FINDINGS: CT HEAD FINDINGS Brain: Stable appearance of areas of hypoattenuation and loss of gray-white differentiation in the right parietal lobe and right occipital lobe. As before, there is possible petechial hemorrhage associated with the occipital infarct. No new hemorrhage or mass effect. Ventricles are stable in size. Vascular: No new findings. Skull: No new findings. Sinuses/Orbits: No new findings. Other: None. CTA NECK FINDINGS Aortic arch: Stable appearance of plaque at the great vessel origins. There is direct origin of the left vertebral artery from the arch. Right carotid system: Circumferential noncalcified plaque along the common carotid is decreased. Decreased plaque at the bifurcation and proximal ICA with improved stenosis post endarterectomy. Left carotid system: Patent. Mixed plaque along the common carotid, bifurcation, and proximal internal carotid. Unchanged 50% stenosis of the proximal ICA. Vertebral arteries: Right vertebral artery is patent. There is unchanged severe stenosis at the origin of the left vertebral with minimal opacification of the P2 segment. Reconstitution is present at the C2-C3 level. Skeleton: Degenerative changes of the cervical spine. Other neck: No acute abnormality. Upper chest: No apical lung mass.  Review of the MIP images confirms the above findings CTA HEAD FINDINGS Anterior circulation: Intracranial internal carotid arteries are patent with mixed plaque causing moderate stenosis. Anterior and middle cerebral arteries are patent. Posterior circulation: Intracranial vertebral arteries are patent with diffuse atherosclerotic irregularity. Left vertebral artery is no longer well seen beyond PICA origin. Basilar artery is patent. Posterior cerebral arteries are patent. Bilateral posterior communicating  arteries are present. Venous sinuses: As permitted by contrast timing, patent. Review of the MIP images confirms the above findings CT Brain Perfusion Findings: CBF (<30%) Volume: 3mL Perfusion (Tmax>6.0s) volume: 39mL Mismatch Volume: 37mL Infarction Location: None. IMPRESSION: Stable appearance of the brain.  No new intracranial hemorrhage. Perfusion imaging demonstrates no evidence of core infarction or penumbra. Less stringent criteria demonstrates elevation of T-max corresponding to right occipital infarct, which is probably subacute with petechial hemorrhage. The right parietal infarct is more likely subacute to chronic. Decreased stenosis and plaque of the distal right common carotid and proximal right internal carotid reflecting interval endarterectomy. Intracranial left vertebral artery is now no longer well seen beyond the PICA origin. Remains high-grade stenosis of the left vertebral origin with reconstitution distally in the neck. Remain significant atherosclerotic irregularity of the proximal left intracranial portion with high-grade stenosis likely accounting for loss of distal flow. Electronically Signed   By: Macy Mis M.D.   On: 06/24/2021 13:06   DG Chest Port 1 View  Result Date: 06/24/2021 CLINICAL DATA:  Code stroke. EXAM: PORTABLE CHEST 1 VIEW COMPARISON:  May 25 21 FINDINGS: Is 631 hours. Low lung volumes. Basilar atelectasis without focal consolidation or overt pulmonary edema.  Interstitial markings are diffusely coarsened with chronic features. The cardiopericardial silhouette is within normal limits for size. Status post CABG. Telemetry leads overlie the chest. IMPRESSION: Low volume chest with basilar atelectasis. Electronically Signed   By: Misty Stanley M.D.   On: 06/24/2021 07:28   ECHOCARDIOGRAM COMPLETE  Result Date: 06/25/2021    ECHOCARDIOGRAM REPORT   Patient Name:   Erika [name] Date of Exam: 06/25/2021 Medical Rec #:  [id number]           Height:       67.0 in Accession #:    [id number]          Weight:       186.0 lb Date of Birth:  27-Jan-1971          BSA:          1.961 m Patient Age:    5 years            BP:           152/74 mmHg Patient Gender: F                   HR:           78 bpm. Exam Location:  [facility] Procedure: 2D Echo Indications:     Stroke  History:         Patient has prior history of Echocardiogram examinations. CAD                  and Previous Myocardial Infarction; Risk Factors:Diabetes and                  Hypertension.  Sonographer:     L Thornton-Maynard Referring Phys:  [id number] Soledad Gerlach NIU Diagnosing Phys: Ida Rogue MD IMPRESSIONS  1. Left ventricular ejection fraction, by estimation, is 60 to 65%. The left ventricle has normal function. The left ventricle has no regional wall motion abnormalities. Left ventricular diastolic parameters were normal.  2. Right ventricular systolic function is normal. The right ventricular size is normal. Tricuspid regurgitation signal is inadequate for assessing PA pressure.  3. The mitral valve is normal in structure. No evidence of mitral valve regurgitation. No evidence of mitral stenosis.  4. The aortic valve is normal in structure.  Aortic valve regurgitation is not visualized. No aortic stenosis is present.  5. The inferior vena cava is normal in size with greater than 50% respiratory variability, suggesting right atrial pressure of 3 mmHg. FINDINGS  Left Ventricle: Left ventricular ejection fraction, by  estimation, is 60 to 65%. The left ventricle has normal function. The left ventricle has no regional wall motion abnormalities. The left ventricular internal cavity size was normal in size. There is  no left ventricular hypertrophy. Left ventricular diastolic parameters were normal. Right Ventricle: The right ventricular size is normal. No increase in right ventricular wall thickness. Right ventricular systolic function is normal. Tricuspid regurgitation signal is inadequate for assessing PA pressure. Left Atrium: Left atrial size was normal in size. Right Atrium: Right atrial size was normal in size. Pericardium: There is no evidence of pericardial effusion. Mitral Valve: The mitral valve is normal in structure. No evidence of mitral valve regurgitation. No evidence of mitral valve stenosis. Tricuspid Valve: The tricuspid valve is normal in structure. Tricuspid valve regurgitation is not demonstrated. No evidence of tricuspid stenosis. Aortic Valve: The aortic valve is normal in structure. Aortic valve regurgitation is not visualized. No aortic stenosis is present. Aortic valve mean gradient measures 4.0 mmHg. Aortic valve peak gradient measures 7.5 mmHg. Aortic valve area, by VTI measures 2.57 cm. Pulmonic Valve: The pulmonic valve was normal in structure. Pulmonic valve regurgitation is not visualized. No evidence of pulmonic stenosis. Aorta: The aortic root is normal in size and structure. Venous: The inferior vena cava is normal in size with greater than 50% respiratory variability, suggesting right atrial pressure of 3 mmHg. IAS/Shunts: No atrial level shunt detected by color flow Doppler.  LEFT VENTRICLE PLAX 2D LVIDd:         3.88 cm  Diastology LVIDs:         2.21 cm  LV e' medial:    4.35 cm/s LV PW:         1.51 cm  LV E/e' medial:  18.5 LV IVS:        1.22 cm  LV e' lateral:   6.31 cm/s LVOT diam:     2.00 cm  LV E/e' lateral: 12.8 LV SV:         65 LV SV Index:   33 LVOT Area:     3.14 cm  RIGHT  VENTRICLE RV S prime:     9.68 cm/s TAPSE (M-mode): 1.6 cm LEFT ATRIUM             Index LA diam:        3.60 cm 1.84 cm/m LA Vol (A2C):   42.1 ml 21.47 ml/m LA Vol (A4C):   36.0 ml 18.36 ml/m LA Biplane Vol: 39.1 ml 19.94 ml/m  AORTIC VALVE AV Area (Vmax):    2.20 cm AV Area (Vmean):   2.44 cm AV Area (VTI):     2.57 cm AV Vmax:           137.00 cm/s AV Vmean:          98.300 cm/s AV VTI:            0.253 m AV Peak Grad:      7.5 mmHg AV Mean Grad:      4.0 mmHg LVOT Vmax:         96.00 cm/s LVOT Vmean:        76.400 cm/s LVOT VTI:          0.207 m LVOT/AV VTI ratio: 0.82  AORTA Ao Root diam: 3.60 cm MV E velocity: 80.60 cm/s MV A velocity: 88.70 cm/s  SHUNTS MV E/A ratio:  0.91        Systemic VTI:  0.21 m                            Systemic Diam: 2.00 cm Ida Rogue MD Electronically signed by Ida Rogue MD Signature Date/Time: 06/25/2021/6:43:21 PM    Final    CT HEAD CODE STROKE WO CONTRAST  Addendum Date: 06/24/2021   ADDENDUM REPORT: 06/24/2021 06:27 ADDENDUM: Study discussed by telephone with Dr. Luvenia Starch SUNG on 06/24/2021 at 0619 hours. Electronically Signed   By: Genevie Ann M.D.   On: 06/24/2021 06:27   Result Date: 06/24/2021 CLINICAL DATA:  Code stroke. 50 year old female with left side weakness. Last known well midnight. History of right carotid endarterectomy. EXAM: CT HEAD WITHOUT CONTRAST TECHNIQUE: Contiguous axial images were obtained from the base of the skull through the vertex without intravenous contrast. COMPARISON:  Brain MRI 12/03/2012.  Head CT 02/25/2019. FINDINGS: Brain: Abnormal cortical and subcortical white matter hypodensity in both the superior right perirolandic cortex (series 3, image 23) and lateral right occipital pole (image 17) are new since 2020, but appears subacute versus chronic. Possible petechial hemorrhage associated with the latter. No superimposed acute cortically based infarct identified. Elsewhere gray-white matter differentiation is within normal limits. No  midline shift, ventriculomegaly, mass effect, evidence of mass lesion, or other intracranial hemorrhage. Vascular: Calcified atherosclerosis at the skull base. No suspicious intracranial vascular hyperdensity. Skull: Negative. Sinuses/Orbits: Visualized paranasal sinuses and mastoids are clear. Other: Visualized orbits and scalp soft tissues are within normal limits. ASPECTS [facility] Stroke Program Early CT Score) Total score (0-10 with 10 being normal): 10 (suspected subacute or chronic cortical infarcts in the right hemisphere). IMPRESSION: 1. Subacute versus chronic appearing cortical infarcts in the right perirolandic and lateral right occipital regions are new since 2020. 2. No superimposed acute cortically based infarct or acute intracranial hemorrhage identified. ASPECTS 10. Electronically Signed: By: Genevie Ann M.D. On: 06/24/2021 06:07   CT ANGIO HEAD CODE STROKE  Result Date: 06/24/2021 CLINICAL DATA:  Slurred speech, left facial droop, left arm weakness, history of right CEA 2017 EXAM: CT ANGIOGRAPHY HEAD AND NECK CT PERFUSION BRAIN TECHNIQUE: Multidetector CT imaging of the head and neck was performed using the standard protocol during bolus administration of intravenous contrast. Multiplanar CT image reconstructions and MIPs were obtained to evaluate the vascular anatomy. Carotid stenosis measurements (when applicable) are obtained utilizing NASCET criteria, using the distal internal carotid diameter as the denominator. Multiphase CT imaging of the brain was performed following IV bolus contrast injection. Subsequent parametric perfusion maps were calculated using RAPID software. CONTRAST:  134mL OMNIPAQUE IOHEXOL 350 MG/ML SOLN COMPARISON:  CT head earlier same day, CTA 04/08/2020 FINDINGS: CT HEAD FINDINGS Brain: Stable appearance of areas of hypoattenuation and loss of gray-white differentiation in the right parietal lobe and right occipital lobe. As before, there is possible petechial hemorrhage  associated with the occipital infarct. No new hemorrhage or mass effect. Ventricles are stable in size. Vascular: No new findings. Skull: No new findings. Sinuses/Orbits: No new findings. Other: None. CTA NECK FINDINGS Aortic arch: Stable appearance of plaque at the great vessel origins. There is direct origin of the left vertebral artery from the arch. Right carotid system: Circumferential noncalcified plaque along the common carotid is decreased. Decreased plaque at the bifurcation and proximal ICA  with improved stenosis post endarterectomy. Left carotid system: Patent. Mixed plaque along the common carotid, bifurcation, and proximal internal carotid. Unchanged 50% stenosis of the proximal ICA. Vertebral arteries: Right vertebral artery is patent. There is unchanged severe stenosis at the origin of the left vertebral with minimal opacification of the P2 segment. Reconstitution is present at the C2-C3 level. Skeleton: Degenerative changes of the cervical spine. Other neck: No acute abnormality. Upper chest: No apical lung mass. Review of the MIP images confirms the above findings CTA HEAD FINDINGS Anterior circulation: Intracranial internal carotid arteries are patent with mixed plaque causing moderate stenosis. Anterior and middle cerebral arteries are patent. Posterior circulation: Intracranial vertebral arteries are patent with diffuse atherosclerotic irregularity. Left vertebral artery is no longer well seen beyond PICA origin. Basilar artery is patent. Posterior cerebral arteries are patent. Bilateral posterior communicating arteries are present. Venous sinuses: As permitted by contrast timing, patent. Review of the MIP images confirms the above findings CT Brain Perfusion Findings: CBF (<30%) Volume: 46mL Perfusion (Tmax>6.0s) volume: 53mL Mismatch Volume: 91mL Infarction Location: None. IMPRESSION: Stable appearance of the brain.  No new intracranial hemorrhage. Perfusion imaging demonstrates no evidence of  core infarction or penumbra. Less stringent criteria demonstrates elevation of T-max corresponding to right occipital infarct, which is probably subacute with petechial hemorrhage. The right parietal infarct is more likely subacute to chronic. Decreased stenosis and plaque of the distal right common carotid and proximal right internal carotid reflecting interval endarterectomy. Intracranial left vertebral artery is now no longer well seen beyond the PICA origin. Remains high-grade stenosis of the left vertebral origin with reconstitution distally in the neck. Remain significant atherosclerotic irregularity of the proximal left intracranial portion with high-grade stenosis likely accounting for loss of distal flow. Electronically Signed   By: Macy Mis M.D.   On: 06/24/2021 13:06   CT ANGIO NECK CODE STROKE  Result Date: 06/24/2021 CLINICAL DATA:  Slurred speech, left facial droop, left arm weakness, history of right CEA 2017 EXAM: CT ANGIOGRAPHY HEAD AND NECK CT PERFUSION BRAIN TECHNIQUE: Multidetector CT imaging of the head and neck was performed using the standard protocol during bolus administration of intravenous contrast. Multiplanar CT image reconstructions and MIPs were obtained to evaluate the vascular anatomy. Carotid stenosis measurements (when applicable) are obtained utilizing NASCET criteria, using the distal internal carotid diameter as the denominator. Multiphase CT imaging of the brain was performed following IV bolus contrast injection. Subsequent parametric perfusion maps were calculated using RAPID software. CONTRAST:  163mL OMNIPAQUE IOHEXOL 350 MG/ML SOLN COMPARISON:  CT head earlier same day, CTA 04/08/2020 FINDINGS: CT HEAD FINDINGS Brain: Stable appearance of areas of hypoattenuation and loss of gray-white differentiation in the right parietal lobe and right occipital lobe. As before, there is possible petechial hemorrhage associated with the occipital infarct. No new hemorrhage or  mass effect. Ventricles are stable in size. Vascular: No new findings. Skull: No new findings. Sinuses/Orbits: No new findings. Other: None. CTA NECK FINDINGS Aortic arch: Stable appearance of plaque at the great vessel origins. There is direct origin of the left vertebral artery from the arch. Right carotid system: Circumferential noncalcified plaque along the common carotid is decreased. Decreased plaque at the bifurcation and proximal ICA with improved stenosis post endarterectomy. Left carotid system: Patent. Mixed plaque along the common carotid, bifurcation, and proximal internal carotid. Unchanged 50% stenosis of the proximal ICA. Vertebral arteries: Right vertebral artery is patent. There is unchanged severe stenosis at the origin of the left vertebral with minimal opacification  of the P2 segment. Reconstitution is present at the C2-C3 level. Skeleton: Degenerative changes of the cervical spine. Other neck: No acute abnormality. Upper chest: No apical lung mass. Review of the MIP images confirms the above findings CTA HEAD FINDINGS Anterior circulation: Intracranial internal carotid arteries are patent with mixed plaque causing moderate stenosis. Anterior and middle cerebral arteries are patent. Posterior circulation: Intracranial vertebral arteries are patent with diffuse atherosclerotic irregularity. Left vertebral artery is no longer well seen beyond PICA origin. Basilar artery is patent. Posterior cerebral arteries are patent. Bilateral posterior communicating arteries are present. Venous sinuses: As permitted by contrast timing, patent. Review of the MIP images confirms the above findings CT Brain Perfusion Findings: CBF (<30%) Volume: 7mL Perfusion (Tmax>6.0s) volume: 69mL Mismatch Volume: 68mL Infarction Location: None. IMPRESSION: Stable appearance of the brain.  No new intracranial hemorrhage. Perfusion imaging demonstrates no evidence of core infarction or penumbra. Less stringent criteria  demonstrates elevation of T-max corresponding to right occipital infarct, which is probably subacute with petechial hemorrhage. The right parietal infarct is more likely subacute to chronic. Decreased stenosis and plaque of the distal right common carotid and proximal right internal carotid reflecting interval endarterectomy. Intracranial left vertebral artery is now no longer well seen beyond the PICA origin. Remains high-grade stenosis of the left vertebral origin with reconstitution distally in the neck. Remain significant atherosclerotic irregularity of the proximal left intracranial portion with high-grade stenosis likely accounting for loss of distal flow. Electronically Signed   By: Macy Mis M.D.   On: 06/24/2021 13:06     Medications   Scheduled Meds:  aspirin  81 mg Oral Daily   benztropine  0.5 mg Oral QHS   chlorproMAZINE  100 mg Oral QHS   clopidogrel  75 mg Oral Daily   enoxaparin (LOVENOX) injection  40 mg Subcutaneous Q24H   FLUoxetine  60 mg Oral Daily   insulin aspart  0-5 Units Subcutaneous QHS   insulin aspart  0-9 Units Subcutaneous TID WC   insulin glargine  45 Units Subcutaneous BID   isosorbide mononitrate  30-60 mg Oral BID   lamoTRIgine  200 mg Oral QHS   nicotine  21 mg Transdermal Daily   pregabalin  225 mg Oral BID   rosuvastatin  20 mg Oral Daily   Continuous Infusions:  sodium chloride 75 mL/hr at 06/25/21 1126   ampicillin-sulbactam (UNASYN) IV Stopped (06/25/21 1625)       LOS: 1 day    Time spent: 30 minutes    Ezekiel Slocumb, DO [facility]  06/25/2021, 7:05 PM      If 7PM-7AM, please contact night-coverage. How to contact the [facility] Attending or Consulting provider [place] or covering provider during after hours [place], for this patient?    Check the care team in [facility] and look for a) attending/consulting [facility] provider listed and b) the [facility] team listed Log into [email or web address] and use [place]'s universal password to access. If  you do not have the password, please contact the hospital operator. Locate the [facility] provider you are looking for under [facility] and page to a number that you can be directly reached. If you still have difficulty reaching the provider, please page the [facility] (Director on Call) for the Hospitalists listed on amion for assistance.

## 2021-06-25 NOTE — Progress Notes (Addendum)
Subjective: The patient feels about the same as yesterday in terms of her strength.   Objective: Current vital signs: BP (!) 150/80 (BP Location: Right Arm)   Pulse 100   Temp (!) 97.5 F (36.4 C) (Oral)   Resp 18   Ht 5\' 7"  (1.702 m)   Wt 84.4 kg   SpO2 92%   BMI 29.13 kg/m  Vital signs in last 24 hours: Temp:  [97.1 F (36.2 C)-98.9 F (37.2 C)] 97.5 F (36.4 C) (07/03 0600) Pulse Rate:  [89-104] 100 (07/03 0600) Resp:  [14-26] 18 (07/03 0600) BP: (118-187)/(67-99) 150/80 (07/03 0600) SpO2:  [90 %-95 %] 92 % (07/03 0600)  Intake/Output from previous day: 07/02 0701 - 07/03 0700 In: 1450 [IV Piggyback:1450] Out: -  Intake/Output this shift: No intake/output data recorded. Nutritional status:  Diet Order             DIET DYS 2 Room service appropriate? Yes with Assist; Fluid consistency: Thin  Diet effective now                  Physical Exam  HEENT-  [place]/AT    Lungs- Respirations unlabored Extremities- Chronic mild pigmentary changes to distal BLE.    Neurological Examination Mental Status: Awake and alert. Fully oriented. Speech is fluent with intact comprehension. No word finding deficit noted. Significant dysarthria continues to be present.  Cranial Nerves: II: Tracks and fixates normally.  III,IV, VI: No ptosis. EOMI.  VII:  Left facial droop.  VIII: Hearing intact to conversation IX,X: No hypophonia XI: Left sided weakness  XII: Midline tongue extension Motor: RUE and RLE 5/5 LUE 2/5 deltoid, 3/5 biceps, triceps, grip LLE 3/5 hip flexion, 4/5 knee extension Sensory: FT intact x 4  Cerebellar: No ataxia with FNF on the right. Left FNF with slowing and decreased distance traversable, but without ataxia.  Gait: Unable to assess.  Lab Results: Results for orders placed or performed during the hospital encounter of 06/24/21 (from the past 48 hour(s))  Protime-INR     Status: None   Collection Time: 06/24/21  5:40 AM  Result Value Ref Range    Prothrombin Time 12.9 11.4 - 15.2 seconds   INR 1.0 0.8 - 1.2    Comment: (NOTE) INR goal varies based on device and disease states. Performed at [facility], [address] [place], [place] [address]   APTT     Status: None   Collection Time: 06/24/21  5:40 AM  Result Value Ref Range   aPTT 30 24 - 36 seconds    Comment: Performed at [facility], [address] [place], [place] [address]  CBC     Status: Abnormal   Collection Time: 06/24/21  5:40 AM  Result Value Ref Range   WBC 18.6 (H) 4.0 - 10.5 K/uL   RBC 5.07 3.87 - 5.11 MIL/uL   Hemoglobin 15.4 (H) 12.0 - 15.0 g/dL   HCT 45.2 36.0 - 46.0 %   MCV 89.2 80.0 - 100.0 fL   MCH 30.4 26.0 - 34.0 pg   MCHC 34.1 30.0 - 36.0 g/dL   RDW 13.9 11.5 - 15.5 %   Platelets 326 150 - 400 K/uL   nRBC 0.1 0.0 - 0.2 %    Comment: Performed at [facility], [address] [place], [place] [address]  Differential     Status: Abnormal   Collection Time: 06/24/21  5:40 AM  Result Value Ref Range   Neutrophils Relative % 84 %  Neutro Abs 15.7 (H) 1.7 - 7.7 K/uL   Lymphocytes Relative 10 %   Lymphs Abs 1.9 0.7 - 4.0 K/uL   Monocytes Relative 4 %   Monocytes Absolute 0.7 0.1 - 1.0 K/uL   Eosinophils Relative 1 %   Eosinophils Absolute 0.1 0.0 - 0.5 K/uL   Basophils Relative 0 %   Basophils Absolute 0.1 0.0 - 0.1 K/uL   Immature Granulocytes 1 %   Abs Immature Granulocytes 0.09 (H) 0.00 - 0.07 K/uL    Comment: Performed at [facility], [address] [place], [place] [address]  Comprehensive metabolic panel     Status: Abnormal   Collection Time: 06/24/21  5:40 AM  Result Value Ref Range   Sodium 135 135 - 145 mmol/L   Potassium 4.0 3.5 - 5.1 mmol/L   Chloride 98 98 - 111 mmol/L   CO2 25 22 - 32 mmol/L   Glucose, Bld 306 (H) 70 - 99 mg/dL    Comment: Glucose reference range applies only to samples taken after fasting for at least 8 hours.   BUN 15 6 - 20 mg/dL   Creatinine, Ser 0.80 0.44 -  1.00 mg/dL   Calcium 9.9 8.9 - 10.3 mg/dL   Total Protein 7.2 6.5 - 8.1 g/dL   Albumin 3.8 3.5 - 5.0 g/dL   AST 63 (H) 15 - 41 U/L   ALT 68 (H) 0 - 44 U/L   Alkaline Phosphatase 85 38 - 126 U/L   Total Bilirubin 0.8 0.3 - 1.2 mg/dL   GFR, Estimated >60 >60 mL/min    Comment: (NOTE) Calculated using the CKD-EPI Creatinine Equation (2021)    Anion gap 12 5 - 15    Comment: Performed at [facility], [address] [place], [place] [address]  Brain natriuretic peptide     Status: None   Collection Time: 06/24/21  5:40 AM  Result Value Ref Range   B Natriuretic Peptide 33.3 0.0 - 100.0 pg/mL    Comment: Performed at [facility], [address] [place], [place] [address]  CBG monitoring, ED     Status: Abnormal   Collection Time: 06/24/21  5:52 AM  Result Value Ref Range   Glucose-Capillary 304 (H) 70 - 99 mg/dL    Comment: Glucose reference range applies only to samples taken after fasting for at least 8 hours.  Ethanol     Status: None   Collection Time: 06/24/21  5:52 AM  Result Value Ref Range   Alcohol, Ethyl (B) <10 <10 mg/dL    Comment: (NOTE) Lowest detectable limit for serum alcohol is 10 mg/dL.  For medical purposes only. Performed at [facility], [address] [place], [place] [address]   Troponin I (High Sensitivity)     Status: Abnormal   Collection Time: 06/24/21  5:52 AM  Result Value Ref Range   Troponin I (High Sensitivity) 106 (HH) <18 ng/L    Comment: CRITICAL RESULT CALLED TO, READ BACK BY AND VERIFIED WITH HEATHER FISHER AT 0713 06/24/21 DAS (NOTE) Elevated high sensitivity troponin I (hsTnI) values and significant  changes across serial measurements may suggest ACS but many other  chronic and acute conditions are known to elevate hsTnI results.  Refer to the "Links" section for chest pain algorithms and additional  guidance. Performed at [facility], [address] [place],  [address]   Resp Panel  by RT-PCR (Flu A&B, Covid) Nasopharyngeal Swab     Status: None  Collection Time: 06/24/21  6:30 AM   Specimen: Nasopharyngeal Swab; Nasopharyngeal(NP) swabs in vial transport medium  Result Value Ref Range   SARS Coronavirus 2 by RT PCR NEGATIVE NEGATIVE    Comment: (NOTE) SARS-CoV-2 target nucleic acids are NOT DETECTED.  The SARS-CoV-2 RNA is generally detectable in upper respiratory specimens during the acute phase of infection. The lowest concentration of SARS-CoV-2 viral copies this assay can detect is 138 copies/mL. A negative result does not preclude SARS-Cov-2 infection and should not be used as the sole basis for treatment or other patient management decisions. A negative result may occur with  improper specimen collection/handling, submission of specimen other than nasopharyngeal swab, presence of viral mutation(s) within the areas targeted by this assay, and inadequate number of viral copies(<138 copies/mL). A negative result must be combined with clinical observations, patient history, and epidemiological information. The expected result is Negative.  Fact Sheet for Patients:  EntrepreneurPulse.com.au  Fact Sheet for Healthcare Providers:  IncredibleEmployment.be  This test is no t yet approved or cleared by the [place] FDA and  has been authorized for detection and/or diagnosis of SARS-CoV-2 by FDA under an Emergency Use Authorization (EUA). This EUA will remain  in effect (meaning this test can be used) for the duration of the COVID-19 declaration under Section 564(b)(1) of the Act, 21 U.S.C.section 360bbb-3(b)(1), unless the authorization is terminated  or revoked sooner.       Influenza A by PCR NEGATIVE NEGATIVE   Influenza B by PCR NEGATIVE NEGATIVE    Comment: (NOTE) The Xpert Xpress SARS-CoV-2/FLU/RSV plus assay is intended as an aid in the diagnosis of influenza from Nasopharyngeal swab specimens and should not  be used as a sole basis for treatment. Nasal washings and aspirates are unacceptable for Xpert Xpress SARS-CoV-2/FLU/RSV testing.  Fact Sheet for Patients: EntrepreneurPulse.com.au  Fact Sheet for Healthcare Providers: IncredibleEmployment.be  This test is not yet approved or cleared by the [place] FDA and has been authorized for detection and/or diagnosis of SARS-CoV-2 by FDA under an Emergency Use Authorization (EUA). This EUA will remain in effect (meaning this test can be used) for the duration of the COVID-19 declaration under Section 564(b)(1) of the Act, 21 U.S.C. section 360bbb-3(b)(1), unless the authorization is terminated or revoked.  Performed at [facility], [address] [place], [place] [address]   Troponin I (High Sensitivity)     Status: Abnormal   Collection Time: 06/24/21  8:12 AM  Result Value Ref Range   Troponin I (High Sensitivity) 172 (HH) <18 ng/L    Comment: CRITICAL VALUE NOTED. VALUE IS CONSISTENT WITH PREVIOUSLY REPORTED/CALLED VALUE.PMF (NOTE) Elevated high sensitivity troponin I (hsTnI) values and significant  changes across serial measurements may suggest ACS but many other  chronic and acute conditions are known to elevate hsTnI results.  Refer to the "Links" section for chest pain algorithms and additional  guidance. Performed at [facility], [address] [place], [place] [address]   Urine Drug Screen, Qualitative     Status: Abnormal   Collection Time: 06/24/21  8:33 AM  Result Value Ref Range   Tricyclic, Ur Screen POSITIVE (A) NONE DETECTED   Amphetamines, Ur Screen NONE DETECTED NONE DETECTED   MDMA (Ecstasy)Ur Screen NONE DETECTED NONE DETECTED   Cocaine Metabolite,Ur [place] NONE DETECTED NONE DETECTED   Opiate, Ur Screen NONE DETECTED NONE DETECTED   Phencyclidine (PCP) Ur S NONE DETECTED NONE DETECTED   Cannabinoid 50 Ng, Ur [place] NONE DETECTED NONE DETECTED  Barbiturates, Ur Screen NONE DETECTED NONE DETECTED   Benzodiazepine, Ur Scrn POSITIVE (A) NONE DETECTED   Methadone Scn, Ur NONE DETECTED NONE DETECTED    Comment: (NOTE) Tricyclics + metabolites, urine    Cutoff 1000 ng/mL Amphetamines + metabolites, urine  Cutoff 1000 ng/mL MDMA (Ecstasy), urine              Cutoff 500 ng/mL Cocaine Metabolite, urine          Cutoff 300 ng/mL Opiate + metabolites, urine        Cutoff 300 ng/mL Phencyclidine (PCP), urine         Cutoff 25 ng/mL Cannabinoid, urine                 Cutoff 50 ng/mL Barbiturates + metabolites, urine  Cutoff 200 ng/mL Benzodiazepine, urine              Cutoff 200 ng/mL Methadone, urine                   Cutoff 300 ng/mL  The urine drug screen provides only a preliminary, unconfirmed analytical test result and should not be used for non-medical purposes. Clinical consideration and professional judgment should be applied to any positive drug screen result due to possible interfering substances. A more specific alternate chemical method must be used in order to obtain a confirmed analytical result. Gas chromatography / mass spectrometry (GC/MS) is the preferred confirm atory method. Performed at [facility], [address] [place], [place] [address]   Urinalysis, Routine w reflex microscopic     Status: Abnormal   Collection Time: 06/24/21  8:33 AM  Result Value Ref Range   Color, Urine YELLOW (A) YELLOW   APPearance HAZY (A) CLEAR   Specific Gravity, Urine 1.032 (H) 1.005 - 1.030   pH 5.0 5.0 - 8.0   Glucose, UA >=500 (A) NEGATIVE mg/dL   Hgb urine dipstick NEGATIVE NEGATIVE   Bilirubin Urine NEGATIVE NEGATIVE   Ketones, ur 20 (A) NEGATIVE mg/dL   Protein, ur 100 (A) NEGATIVE mg/dL   Nitrite NEGATIVE NEGATIVE   Leukocytes,Ua NEGATIVE NEGATIVE   RBC / HPF 0-5 0 - 5 RBC/hpf   WBC, UA 0-5 0 - 5 WBC/hpf   Bacteria, UA RARE (A) NONE SEEN   Squamous Epithelial / LPF 0-5 0 - 5   Mucus PRESENT     Comment:  Performed at [facility], [address] [place], [place] [address]  Pregnancy, urine     Status: None   Collection Time: 06/24/21  8:39 AM  Result Value Ref Range   Preg Test, Ur NEGATIVE NEGATIVE    Comment: Performed at [facility], [place], [place] [address]  Troponin I (High Sensitivity)     Status: Abnormal   Collection Time: 06/24/21  1:05 PM  Result Value Ref Range   Troponin I (High Sensitivity) 250 (HH) <18 ng/L    Comment: CRITICAL VALUE NOTED. VALUE IS CONSISTENT WITH PREVIOUSLY REPORTED/CALLED VALUE DAS (NOTE) Elevated high sensitivity troponin I (hsTnI) values and significant  changes across serial measurements may suggest ACS but many other  chronic and acute conditions are known to elevate hsTnI results.  Refer to the "Links" section for chest pain algorithms and additional  guidance. Performed at [facility], [address] [place], [place] [address]   Hepatitis panel, acute     Status: None   Collection Time: 06/24/21  1:05 PM  Result Value Ref Range  Hepatitis B Surface Ag NON REACTIVE NON REACTIVE   HCV Ab NON REACTIVE NON REACTIVE    Comment: (NOTE) Nonreactive HCV antibody screen is consistent with no HCV infections,  unless recent infection is suspected or other evidence exists to indicate HCV infection.     Hep A IgM NON REACTIVE NON REACTIVE   Hep B C IgM NON REACTIVE NON REACTIVE    Comment: Performed at [facility], [place] [address] [place], [place] [address]  Lactic acid, plasma     Status: Abnormal   Collection Time: 06/24/21  1:05 PM  Result Value Ref Range   Lactic Acid, Venous 2.5 (HH) 0.5 - 1.9 mmol/L    Comment: CRITICAL RESULT CALLED TO, READ BACK BY AND VERIFIED WITH [facility] AT 0786 06/24/21 DAS Performed at [facility], [address] [place], [place] [address]   Procalcitonin     Status: None   Collection Time: 06/24/21  1:05 PM  Result Value Ref Range    Procalcitonin <0.10 ng/mL    Comment:        Interpretation: PCT (Procalcitonin) <= 0.5 ng/mL: Systemic infection (sepsis) is not likely. Local bacterial infection is possible. (NOTE)       Sepsis PCT Algorithm           Lower Respiratory Tract                                      Infection PCT Algorithm    ----------------------------     ----------------------------         PCT < 0.25 ng/mL                PCT < 0.10 ng/mL          Strongly encourage             Strongly discourage   discontinuation of antibiotics    initiation of antibiotics    ----------------------------     -----------------------------       PCT 0.25 - 0.50 ng/mL            PCT 0.10 - 0.25 ng/mL               OR       >80% decrease in PCT            Discourage initiation of                                            antibiotics      Encourage discontinuation           of antibiotics    ----------------------------     -----------------------------         PCT >= 0.50 ng/mL              PCT 0.26 - 0.50 ng/mL               AND        <80% decrease in PCT             Encourage initiation of  antibiotics       Encourage continuation           of antibiotics    ----------------------------     -----------------------------        PCT >= 0.50 ng/mL                  PCT > 0.50 ng/mL               AND         increase in PCT                  Strongly encourage                                      initiation of antibiotics    Strongly encourage escalation           of antibiotics                                     -----------------------------                                           PCT <= 0.25 ng/mL                                                 OR                                        > 80% decrease in PCT                                      Discontinue / Do not initiate                                             antibiotics  Performed at [facility], [address] [place], [place] [address]   CBG monitoring, ED     Status: Abnormal   Collection Time: 06/24/21  4:40 PM  Result Value Ref Range   Glucose-Capillary 202 (H) 70 - 99 mg/dL    Comment: Glucose reference range applies only to samples taken after fasting for at least 8 hours.  CBG monitoring, ED     Status: Abnormal   Collection Time: 06/24/21  8:55 PM  Result Value Ref Range   Glucose-Capillary 231 (H) 70 - 99 mg/dL    Comment: Glucose reference range applies only to samples taken after fasting for at least 8 hours.    Recent Results (from the past 240 hour(s))  Resp Panel by RT-PCR (Flu A&B, Covid) Nasopharyngeal Swab     Status: None   Collection Time: 06/24/21  6:30 AM   Specimen: Nasopharyngeal Swab; Nasopharyngeal(NP) swabs in vial transport medium  Result Value Ref Range Status   SARS Coronavirus  2 by RT PCR NEGATIVE NEGATIVE Final    Comment: (NOTE) SARS-CoV-2 target nucleic acids are NOT DETECTED.  The SARS-CoV-2 RNA is generally detectable in upper respiratory specimens during the acute phase of infection. The lowest concentration of SARS-CoV-2 viral copies this assay can detect is 138 copies/mL. A negative result does not preclude SARS-Cov-2 infection and should not be used as the sole basis for treatment or other patient management decisions. A negative result may occur with  improper specimen collection/handling, submission of specimen other than nasopharyngeal swab, presence of viral mutation(s) within the areas targeted by this assay, and inadequate number of viral copies(<138 copies/mL). A negative result must be combined with clinical observations, patient history, and epidemiological information. The expected result is Negative.  Fact Sheet for Patients:  EntrepreneurPulse.com.au  Fact Sheet for Healthcare Providers:  IncredibleEmployment.be  This test is no t yet approved or cleared by the [place] FDA and  has been authorized for detection and/or diagnosis of SARS-CoV-2 by FDA under an Emergency Use Authorization (EUA). This EUA will remain  in effect (meaning this test can be used) for the duration of the COVID-19 declaration under Section 564(b)(1) of the Act, 21 U.S.C.section 360bbb-3(b)(1), unless the authorization is terminated  or revoked sooner.       Influenza A by PCR NEGATIVE NEGATIVE Final   Influenza B by PCR NEGATIVE NEGATIVE Final    Comment: (NOTE) The Xpert Xpress SARS-CoV-2/FLU/RSV plus assay is intended as an aid in the diagnosis of influenza from Nasopharyngeal swab specimens and should not be used as a sole basis for treatment. Nasal washings and aspirates are unacceptable for Xpert Xpress SARS-CoV-2/FLU/RSV testing.  Fact Sheet for Patients: EntrepreneurPulse.com.au  Fact Sheet for Healthcare Providers: IncredibleEmployment.be  This test is not yet approved or cleared by the [place] FDA and has been authorized for detection and/or diagnosis of SARS-CoV-2 by FDA under an Emergency Use Authorization (EUA). This EUA will remain in effect (meaning this test can be used) for the duration of the COVID-19 declaration under Section 564(b)(1) of the Act, 21 U.S.C. section 360bbb-3(b)(1), unless the authorization is terminated or revoked.  Performed at [facility], [address] [place], [place] [address]     Lipid Panel No results for input(s): CHOL, TRIG, HDL, CHOLHDL, VLDL, LDLCALC in the last 72 hours.  Studies/Results: CT Angio Chest Pulmonary Embolism (PE) W or WO Contrast  Result Date: 06/24/2021 CLINICAL DATA:  Evaluate for pulmonary embolism. History of code stroke. EXAM: CT ANGIOGRAPHY CHEST WITH CONTRAST TECHNIQUE: Multidetector CT imaging of the chest was performed using the standard protocol during bolus administration of intravenous contrast. Multiplanar CT image reconstructions and  MIPs were obtained to evaluate the vascular anatomy. CONTRAST:  90mL OMNIPAQUE IOHEXOL 350 MG/ML SOLN COMPARISON:  Chest CT 06/28/2016 FINDINGS: Cardiovascular: Suboptimal evaluation of the pulmonary arteries. Large amount of contrast is already in the aorta. Bilateral main pulmonary arteries are patent but cannot evaluate beyond the main pulmonary arteries. Atherosclerotic disease in the thoracic aorta, post CABG. Calcifications in the native coronary arteries. Great vessels are patent. Atherosclerotic plaque involving the right common carotid artery and innominate artery. Normal caliber of the thoracic aorta. Mediastinum/Nodes: Postsurgical changes from median sternotomy and CABG. No significant lymph node enlargement. Lungs/Pleura: Trachea and mainstem bronchi are patent. No large pleural effusions. Patchy densities in the right upper lobe. 5 mm nodule in the superior segment of the right lower lobe has minimally changed since 2017 and likely benign. zPeripheral densities in the  posterior right lower lobe are most compatible with atelectasis. Peripheral nodule in the posterior left lower lobe on sequence 6 image 49 appears stable. Upper Abdomen: Diffuse low density of the liver is compatible with steatosis. Musculoskeletal: No acute bone abnormality. Review of the MIP images confirms the above findings. IMPRESSION: 1. Suboptimal evaluation for pulmonary embolism. Pulmonary emboli cannot be confidently evaluated on this examination. 2. Patchy densities in the right upper lobe. Findings are nonspecific and could be related to areas of atelectasis versus mild infection. 3. Nodule in the superior segment of the right lower lobe has minimally changed since 2017 and likely benign. 4. Hepatic steatosis. 5.  Aortic Atherosclerosis (ICD10-I70.0). Electronically Signed   By: Markus Daft M.D.   On: 06/24/2021 10:55   MR BRAIN WO CONTRAST  Result Date: 06/24/2021 CLINICAL DATA:  Slurred speech, left facial droop, left arm  weakness, code stroke follow-up EXAM: MRI HEAD WITHOUT CONTRAST TECHNIQUE: Multiplanar, multiecho pulse sequences of the brain and surrounding structures were obtained without intravenous contrast. COMPARISON:  None. FINDINGS: Brain: There is reduced diffusion in the parasagittal right pons. The right parietal and occipital infarcts identified on prior CT imaging are chronic. There is associated T1 shortening reflecting laminar necrosis. This accounts for the hyperdensity seen on CT rather than hemorrhage. There is no intracranial mass or mass effect. Additional patchy foci of T2 hyperintensity in the supratentorial and pontine white matter are nonspecific but may reflect mild chronic microvascular ischemic changes. There are a few scattered foci of susceptibility hypointensity in the cerebral white matter, left basal ganglia, and right thalamus compatible with chronic microhemorrhages. There is no hydrocephalus or extra-axial fluid collection. Vascular: Major vessel flow voids at the skull base are preserved. Skull and upper cervical spine: Normal marrow signal is preserved. Sinuses/Orbits: Paranasal sinuses are aerated. Orbits are unremarkable. Other: Sella is unremarkable.  Mastoid air cells are clear. IMPRESSION: Acute right pontine perforator infarct. Right parietal and occipital infarcts are chronic. Mild chronic microvascular ischemic changes. Mild burden of scattered chronic microhemorrhages. Electronically Signed   By: Macy Mis M.D.   On: 06/24/2021 14:56   CT CEREBRAL PERFUSION W CONTRAST  Result Date: 06/24/2021 CLINICAL DATA:  Slurred speech, left facial droop, left arm weakness, history of right CEA 2017 EXAM: CT ANGIOGRAPHY HEAD AND NECK CT PERFUSION BRAIN TECHNIQUE: Multidetector CT imaging of the head and neck was performed using the standard protocol during bolus administration of intravenous contrast. Multiplanar CT image reconstructions and MIPs were obtained to evaluate the vascular  anatomy. Carotid stenosis measurements (when applicable) are obtained utilizing NASCET criteria, using the distal internal carotid diameter as the denominator. Multiphase CT imaging of the brain was performed following IV bolus contrast injection. Subsequent parametric perfusion maps were calculated using RAPID software. CONTRAST:  164mL OMNIPAQUE IOHEXOL 350 MG/ML SOLN COMPARISON:  CT head earlier same day, CTA 04/08/2020 FINDINGS: CT HEAD FINDINGS Brain: Stable appearance of areas of hypoattenuation and loss of gray-white differentiation in the right parietal lobe and right occipital lobe. As before, there is possible petechial hemorrhage associated with the occipital infarct. No new hemorrhage or mass effect. Ventricles are stable in size. Vascular: No new findings. Skull: No new findings. Sinuses/Orbits: No new findings. Other: None. CTA NECK FINDINGS Aortic arch: Stable appearance of plaque at the great vessel origins. There is direct origin of the left vertebral artery from the arch. Right carotid system: Circumferential noncalcified plaque along the common carotid is decreased. Decreased plaque at the bifurcation and proximal ICA with improved  stenosis post endarterectomy. Left carotid system: Patent. Mixed plaque along the common carotid, bifurcation, and proximal internal carotid. Unchanged 50% stenosis of the proximal ICA. Vertebral arteries: Right vertebral artery is patent. There is unchanged severe stenosis at the origin of the left vertebral with minimal opacification of the P2 segment. Reconstitution is present at the C2-C3 level. Skeleton: Degenerative changes of the cervical spine. Other neck: No acute abnormality. Upper chest: No apical lung mass. Review of the MIP images confirms the above findings CTA HEAD FINDINGS Anterior circulation: Intracranial internal carotid arteries are patent with mixed plaque causing moderate stenosis. Anterior and middle cerebral arteries are patent. Posterior  circulation: Intracranial vertebral arteries are patent with diffuse atherosclerotic irregularity. Left vertebral artery is no longer well seen beyond PICA origin. Basilar artery is patent. Posterior cerebral arteries are patent. Bilateral posterior communicating arteries are present. Venous sinuses: As permitted by contrast timing, patent. Review of the MIP images confirms the above findings CT Brain Perfusion Findings: CBF (<30%) Volume: 24mL Perfusion (Tmax>6.0s) volume: 18mL Mismatch Volume: 65mL Infarction Location: None. IMPRESSION: Stable appearance of the brain.  No new intracranial hemorrhage. Perfusion imaging demonstrates no evidence of core infarction or penumbra. Less stringent criteria demonstrates elevation of T-max corresponding to right occipital infarct, which is probably subacute with petechial hemorrhage. The right parietal infarct is more likely subacute to chronic. Decreased stenosis and plaque of the distal right common carotid and proximal right internal carotid reflecting interval endarterectomy. Intracranial left vertebral artery is now no longer well seen beyond the PICA origin. Remains high-grade stenosis of the left vertebral origin with reconstitution distally in the neck. Remain significant atherosclerotic irregularity of the proximal left intracranial portion with high-grade stenosis likely accounting for loss of distal flow. Electronically Signed   By: Macy Mis M.D.   On: 06/24/2021 13:06   DG Chest Port 1 View  Result Date: 06/24/2021 CLINICAL DATA:  Code stroke. EXAM: PORTABLE CHEST 1 VIEW COMPARISON:  May 25 21 FINDINGS: Is 631 hours. Low lung volumes. Basilar atelectasis without focal consolidation or overt pulmonary edema. Interstitial markings are diffusely coarsened with chronic features. The cardiopericardial silhouette is within normal limits for size. Status post CABG. Telemetry leads overlie the chest. IMPRESSION: Low volume chest with basilar atelectasis.  Electronically Signed   By: Misty Stanley M.D.   On: 06/24/2021 07:28   CT HEAD CODE STROKE WO CONTRAST  Addendum Date: 06/24/2021   ADDENDUM REPORT: 06/24/2021 06:27 ADDENDUM: Study discussed by telephone with Dr. Luvenia Starch SUNG on 06/24/2021 at 0619 hours. Electronically Signed   By: Genevie Ann M.D.   On: 06/24/2021 06:27   Result Date: 06/24/2021 CLINICAL DATA:  Code stroke. 50 year old female with left side weakness. Last known well midnight. History of right carotid endarterectomy. EXAM: CT HEAD WITHOUT CONTRAST TECHNIQUE: Contiguous axial images were obtained from the base of the skull through the vertex without intravenous contrast. COMPARISON:  Brain MRI 12/03/2012.  Head CT 02/25/2019. FINDINGS: Brain: Abnormal cortical and subcortical white matter hypodensity in both the superior right perirolandic cortex (series 3, image 23) and lateral right occipital pole (image 17) are new since 2020, but appears subacute versus chronic. Possible petechial hemorrhage associated with the latter. No superimposed acute cortically based infarct identified. Elsewhere gray-white matter differentiation is within normal limits. No midline shift, ventriculomegaly, mass effect, evidence of mass lesion, or other intracranial hemorrhage. Vascular: Calcified atherosclerosis at the skull base. No suspicious intracranial vascular hyperdensity. Skull: Negative. Sinuses/Orbits: Visualized paranasal sinuses and mastoids are clear. Other: Visualized  orbits and scalp soft tissues are within normal limits. ASPECTS [facility] Stroke Program Early CT Score) Total score (0-10 with 10 being normal): 10 (suspected subacute or chronic cortical infarcts in the right hemisphere). IMPRESSION: 1. Subacute versus chronic appearing cortical infarcts in the right perirolandic and lateral right occipital regions are new since 2020. 2. No superimposed acute cortically based infarct or acute intracranial hemorrhage identified. ASPECTS 10. Electronically Signed:  By: Genevie Ann M.D. On: 06/24/2021 06:07   CT ANGIO HEAD CODE STROKE  Result Date: 06/24/2021 CLINICAL DATA:  Slurred speech, left facial droop, left arm weakness, history of right CEA 2017 EXAM: CT ANGIOGRAPHY HEAD AND NECK CT PERFUSION BRAIN TECHNIQUE: Multidetector CT imaging of the head and neck was performed using the standard protocol during bolus administration of intravenous contrast. Multiplanar CT image reconstructions and MIPs were obtained to evaluate the vascular anatomy. Carotid stenosis measurements (when applicable) are obtained utilizing NASCET criteria, using the distal internal carotid diameter as the denominator. Multiphase CT imaging of the brain was performed following IV bolus contrast injection. Subsequent parametric perfusion maps were calculated using RAPID software. CONTRAST:  184mL OMNIPAQUE IOHEXOL 350 MG/ML SOLN COMPARISON:  CT head earlier same day, CTA 04/08/2020 FINDINGS: CT HEAD FINDINGS Brain: Stable appearance of areas of hypoattenuation and loss of gray-white differentiation in the right parietal lobe and right occipital lobe. As before, there is possible petechial hemorrhage associated with the occipital infarct. No new hemorrhage or mass effect. Ventricles are stable in size. Vascular: No new findings. Skull: No new findings. Sinuses/Orbits: No new findings. Other: None. CTA NECK FINDINGS Aortic arch: Stable appearance of plaque at the great vessel origins. There is direct origin of the left vertebral artery from the arch. Right carotid system: Circumferential noncalcified plaque along the common carotid is decreased. Decreased plaque at the bifurcation and proximal ICA with improved stenosis post endarterectomy. Left carotid system: Patent. Mixed plaque along the common carotid, bifurcation, and proximal internal carotid. Unchanged 50% stenosis of the proximal ICA. Vertebral arteries: Right vertebral artery is patent. There is unchanged severe stenosis at the origin of the  left vertebral with minimal opacification of the P2 segment. Reconstitution is present at the C2-C3 level. Skeleton: Degenerative changes of the cervical spine. Other neck: No acute abnormality. Upper chest: No apical lung mass. Review of the MIP images confirms the above findings CTA HEAD FINDINGS Anterior circulation: Intracranial internal carotid arteries are patent with mixed plaque causing moderate stenosis. Anterior and middle cerebral arteries are patent. Posterior circulation: Intracranial vertebral arteries are patent with diffuse atherosclerotic irregularity. Left vertebral artery is no longer well seen beyond PICA origin. Basilar artery is patent. Posterior cerebral arteries are patent. Bilateral posterior communicating arteries are present. Venous sinuses: As permitted by contrast timing, patent. Review of the MIP images confirms the above findings CT Brain Perfusion Findings: CBF (<30%) Volume: 65mL Perfusion (Tmax>6.0s) volume: 62mL Mismatch Volume: 21mL Infarction Location: None. IMPRESSION: Stable appearance of the brain.  No new intracranial hemorrhage. Perfusion imaging demonstrates no evidence of core infarction or penumbra. Less stringent criteria demonstrates elevation of T-max corresponding to right occipital infarct, which is probably subacute with petechial hemorrhage. The right parietal infarct is more likely subacute to chronic. Decreased stenosis and plaque of the distal right common carotid and proximal right internal carotid reflecting interval endarterectomy. Intracranial left vertebral artery is now no longer well seen beyond the PICA origin. Remains high-grade stenosis of the left vertebral origin with reconstitution distally in the neck. Remain significant atherosclerotic irregularity  of the proximal left intracranial portion with high-grade stenosis likely accounting for loss of distal flow. Electronically Signed   By: Macy Mis M.D.   On: 06/24/2021 13:06   CT ANGIO NECK CODE  STROKE  Result Date: 06/24/2021 CLINICAL DATA:  Slurred speech, left facial droop, left arm weakness, history of right CEA 2017 EXAM: CT ANGIOGRAPHY HEAD AND NECK CT PERFUSION BRAIN TECHNIQUE: Multidetector CT imaging of the head and neck was performed using the standard protocol during bolus administration of intravenous contrast. Multiplanar CT image reconstructions and MIPs were obtained to evaluate the vascular anatomy. Carotid stenosis measurements (when applicable) are obtained utilizing NASCET criteria, using the distal internal carotid diameter as the denominator. Multiphase CT imaging of the brain was performed following IV bolus contrast injection. Subsequent parametric perfusion maps were calculated using RAPID software. CONTRAST:  192mL OMNIPAQUE IOHEXOL 350 MG/ML SOLN COMPARISON:  CT head earlier same day, CTA 04/08/2020 FINDINGS: CT HEAD FINDINGS Brain: Stable appearance of areas of hypoattenuation and loss of gray-white differentiation in the right parietal lobe and right occipital lobe. As before, there is possible petechial hemorrhage associated with the occipital infarct. No new hemorrhage or mass effect. Ventricles are stable in size. Vascular: No new findings. Skull: No new findings. Sinuses/Orbits: No new findings. Other: None. CTA NECK FINDINGS Aortic arch: Stable appearance of plaque at the great vessel origins. There is direct origin of the left vertebral artery from the arch. Right carotid system: Circumferential noncalcified plaque along the common carotid is decreased. Decreased plaque at the bifurcation and proximal ICA with improved stenosis post endarterectomy. Left carotid system: Patent. Mixed plaque along the common carotid, bifurcation, and proximal internal carotid. Unchanged 50% stenosis of the proximal ICA. Vertebral arteries: Right vertebral artery is patent. There is unchanged severe stenosis at the origin of the left vertebral with minimal opacification of the P2 segment.  Reconstitution is present at the C2-C3 level. Skeleton: Degenerative changes of the cervical spine. Other neck: No acute abnormality. Upper chest: No apical lung mass. Review of the MIP images confirms the above findings CTA HEAD FINDINGS Anterior circulation: Intracranial internal carotid arteries are patent with mixed plaque causing moderate stenosis. Anterior and middle cerebral arteries are patent. Posterior circulation: Intracranial vertebral arteries are patent with diffuse atherosclerotic irregularity. Left vertebral artery is no longer well seen beyond PICA origin. Basilar artery is patent. Posterior cerebral arteries are patent. Bilateral posterior communicating arteries are present. Venous sinuses: As permitted by contrast timing, patent. Review of the MIP images confirms the above findings CT Brain Perfusion Findings: CBF (<30%) Volume: 84mL Perfusion (Tmax>6.0s) volume: 52mL Mismatch Volume: 11mL Infarction Location: None. IMPRESSION: Stable appearance of the brain.  No new intracranial hemorrhage. Perfusion imaging demonstrates no evidence of core infarction or penumbra. Less stringent criteria demonstrates elevation of T-max corresponding to right occipital infarct, which is probably subacute with petechial hemorrhage. The right parietal infarct is more likely subacute to chronic. Decreased stenosis and plaque of the distal right common carotid and proximal right internal carotid reflecting interval endarterectomy. Intracranial left vertebral artery is now no longer well seen beyond the PICA origin. Remains high-grade stenosis of the left vertebral origin with reconstitution distally in the neck. Remain significant atherosclerotic irregularity of the proximal left intracranial portion with high-grade stenosis likely accounting for loss of distal flow. Electronically Signed   By: Macy Mis M.D.   On: 06/24/2021 13:06    Medications: Scheduled:  aspirin  81 mg Oral Daily   benztropine  0.5 mg Oral  QHS   chlorproMAZINE  100 mg Oral QHS   clopidogrel  75 mg Oral Daily   enoxaparin (LOVENOX) injection  40 mg Subcutaneous Q24H   FLUoxetine  60 mg Oral Daily   insulin aspart  0-5 Units Subcutaneous QHS   insulin aspart  0-9 Units Subcutaneous TID WC   insulin glargine  45 Units Subcutaneous BID   isosorbide mononitrate  30-60 mg Oral BID   lamoTRIgine  200 mg Oral QHS   nicotine  21 mg Transdermal Daily   pregabalin  225 mg Oral BID   rosuvastatin  20 mg Oral Daily   Continuous:  sodium chloride 75 mL/hr at 06/24/21 2052   cefTRIAXone (ROCEPHIN)  IV Stopped (06/24/21 1201)   doxycycline (VIBRAMYCIN) IV Stopped (06/25/21 0048)   MRI brain: Acute right pontine perforator infarct.    Assessment: 50 year old female presenting with acute onset of left sided weakness on 7/2 and evaluated by Teleneurology. Right hemispheric strokes of indeterminate age seen on CT at two locations. Determined not to be a tPA candidate by Teleneurology. MRI brain reveals an acute right pontine perforator ischemic infarction. 1. Exam today reveals dysarthria, left facial droop and left sided motor deficits, unchanged since yesterday.  2. MRI brain: Acute right pontine perforator infarct. Right parietal and occipital infarcts are chronic. Mild chronic microvascular ischemic changes. Mild burden of scattered chronic microhemorrhages.  3. CTA negative for LVO.  4. Stroke risk factors: CAD, carotid arterial disease, HLD, hypertension, obesity, PAD, tobacco abuse and DM2 5. Elevated troponin.  6. Leukocytosis.  7. TTE completed with report pending.  8. The patient would likely be a good candidate for inpatient rehab.    Recommendations: 1. IVF 2. Frequent neuro checks 3. Smoking cessation discussed at length with patient and husband.  4. Cardiac telemetry. 5. PT/OT/Speech 6. Continue Plavix and newly-started ASA. 7. Continue rosuvastatin 8. BP management. Now out of the permissive HTN time window.  9.  Glycemic control 10. PRN fever control 11. Rehabilitation Medicine consult.   Addendum: - TTE: LVEF is 60 to 65%. The left ventricle has normal function, with no regional wall motion abnormalities. Left ventricular diastolic parameters were normal. No mural thrombus, valvular vegetation or ASD is seen.  - Will need outpatient Neurology follow up after discharge.  - Neurohospitalist service will sign off. Please call if there are additional questions.    LOS: 1 day   @Electronically  signed: Dr. Kerney Elbe 06/25/2021  8:16 AM

## 2021-06-25 NOTE — Evaluation (Signed)
Physical Therapy Evaluation Patient Details Name: Erika [name] MRN: [id number] DOB: December 15, 1971 Today's Date: 06/25/2021   History of Present Illness  Pt is a 51 y/o F admitted on 06/24/21 with c/c of slurred speech, L facial droop & LUE weakness. MRI revealed acute R pontine perforator infarct with chronic R parietal & occipital infarcts. PMH: HTN, HLD, DM, tobacco abuse, PAD, CAD, CABG, dCHF, peptic foot drop, renal artery stenosis, CAD (s/p of R CEA 2017), PTSD, adjustment disorder, MI, obesity  Clinical Impression  Pt seen for PT evaluation with pt's husband arriving during latter half of session. Pt (& spouse) is extremely motivated to participate in PT, requesting most intensive therapy to allow pt to return to PLOF. Pt is currently able to perform supine<>sit with supervision but relies on hospital bed features. Pt attempts to stand and march in place at EOB but experiences L knee buckling & pt requests RW. Pt is able to ambulate short distance with RW but ongoing L knee instability & gait pattern as noted below. PT educated pt & spouse on stroke recovery, rehab options & purpose of PT role following acute event. Pt is an excellent candidate for CIR upon d/c from acute setting. Pt is able to tolerate intense rehab, has an extremely supportive family/friends, and is motivated & eager to return to PLOF.     Follow Up Recommendations CIR;Supervision for mobility/OOB    Equipment Recommendations   (TBD in next venue)    Recommendations for Other Services       Precautions / Restrictions Precautions Precautions: Fall Precaution Comments: L hemi (UE>LE) Restrictions Weight Bearing Restrictions: No      Mobility  Bed Mobility Overal bed mobility: Needs Assistance Bed Mobility: Supine to Sit;Sit to Supine     Supine to sit: Supervision;HOB elevated Sit to supine: Supervision;HOB elevated   General bed mobility comments: reliance on bed rails & HOB elevated, extra time to  elevate LE onto bed    Transfers Overall transfer level: Needs assistance Equipment used: 1 person hand held assist;Rolling walker (2 wheeled) Transfers: Sit to/from Stand Sit to Stand: Min assist         General transfer comment: when provided with RW pt uses RUE to place LUE onto AD  Ambulation/Gait Ambulation/Gait assistance: Min assist;Mod assist Gait Distance (Feet): 22 Feet Assistive device: Rolling walker (2 wheeled) Gait Pattern/deviations: Decreased step length - right;Decreased step length - left;Decreased stride length;Decreased weight shift to left Gait velocity: decreased   General Gait Details: L knee A/P instability (L knee buckling, mild genu recurvatum in stance phase)  Stairs            Wheelchair Mobility    Modified Rankin (Stroke Patients Only)       Balance Overall balance assessment: Needs assistance Sitting-balance support: No upper extremity supported;Feet supported Sitting balance-Leahy Scale: Fair       Standing balance-Leahy Scale: Poor Standing balance comment: L knee buckling impairs balance                             Pertinent Vitals/Pain Pain Assessment: Faces Faces Pain Scale: Hurts a little bit Pain Location: HA (pt endorses hx of cluster HA) on R side Pain Descriptors / Indicators: Headache Pain Intervention(s): Monitored during session    Home Living Family/patient expects to be discharged to:: Private residence Living Arrangements: Spouse/significant other;Children Available Help at Discharge: Family;Available 24 hours/day Type of Home: House Home Access: Stairs to enter  Entrance Stairs-Rails: Right (B rails but L is loose) Entrance Stairs-Number of Steps: 4 Home Layout: One level Home Equipment: Shower seat - built in;Hand held shower head;Cane - single point      Prior Function Level of Independence: Independent         Comments: Pt independent at baseline, no AD, driving, helps to care for  special needs daughter     Hand Dominance   Dominant Hand: Right    Extremity/Trunk Assessment   Upper Extremity Assessment Upper Extremity Assessment:  (hx of R radial nerve palsy; LUE per OT assessment) LUE Deficits / Details: shoulder flexion 3/5, elbow flex/ext 4-/5, grip 4/5; impaired coordination with testing, sensation intact, function improves with visual attention to task, able to distinguish hot/cold LUE Sensation: WNL LUE Coordination: decreased fine motor;decreased gross motor    Lower Extremity Assessment LLE Deficits / Details: 2-/5 hip flexion in sitting, 2+/5 ankle dorsiflexion, 3-/5 knee extension in sitting, LLE heel to shin slowed movement       Communication   Communication: Other (comment) (L side oral motor weakness)  Cognition Arousal/Alertness: Awake/alert Behavior During Therapy: WFL for tasks assessed/performed Overall Cognitive Status: Within Functional Limits for tasks assessed                                 General Comments: Extremely motivated to participate in PT & get better      General Comments General comments (skin integrity, edema, etc.): BP 152/74 mmHg (MAP 96) at beginning of session, pt on 2L/min via nasal cannula at beginning & end of session but doffs cannula & on room air during mobility but SpO2 90% or > throughout (spouse reports O2 has been dropping when pt lies in bed - educated pt on pursed lip breathing). Spent time educating pt on stroke recovery, role of PT intervention, rehab options following d/c from acute setting. Pt requesting squeeze ball with PT fashioning rolled washcloth. Recommend pt only ambulate with staff (vs husband) at this time).    Exercises     Assessment/Plan    PT Assessment Patient needs continued PT services  PT Problem List Decreased strength;Decreased mobility;Decreased coordination;Decreased activity tolerance;Cardiopulmonary status limiting activity;Decreased balance;Decreased knowledge  of use of DME       PT Treatment Interventions DME instruction;Therapeutic exercise;Balance training;Gait training;Stair training;Neuromuscular re-education;Functional mobility training;Modalities;Therapeutic activities;Patient/family education    PT Goals (Current goals can be found in the Care Plan section)  Acute Rehab PT Goals Patient Stated Goal: to go to rehab to get better so I can take care of my daughter PT Goal Formulation: With patient/family Time For Goal Achievement: 07/09/21 Potential to Achieve Goals: Good    Frequency 7X/week   Barriers to discharge        Co-evaluation               AM-PAC PT "6 Clicks" Mobility  Outcome Measure Help needed turning from your back to your side while in a flat bed without using bedrails?: A Little Help needed moving from lying on your back to sitting on the side of a flat bed without using bedrails?: A Little Help needed moving to and from a bed to a chair (including a wheelchair)?: A Lot Help needed standing up from a chair using your arms (e.g., wheelchair or bedside chair)?: A Little Help needed to walk in hospital room?: A Lot Help needed climbing 3-5 steps with a railing? : A Lot  6 Click Score: 15    End of Session Equipment Utilized During Treatment: Gait belt Activity Tolerance: Patient tolerated treatment well Patient left: in bed;with call bell/phone within reach;with family/visitor present Nurse Communication:  (O2) PT Visit Diagnosis: Hemiplegia and hemiparesis;Muscle weakness (generalized) (M62.81);Difficulty in walking, not elsewhere classified (R26.2);Unsteadiness on feet (R26.81) Hemiplegia - Right/Left: Left Hemiplegia - dominant/non-dominant: Non-dominant Hemiplegia - caused by: Cerebral infarction    Time: 1041-1105 PT Time Calculation (min) (ACUTE ONLY): 24 min   Charges:   PT Evaluation $PT Eval Moderate Complexity: 1 Mod PT Treatments $Neuromuscular Re-education: 8-22 mins        Lavone Nian, PT, DPT 06/25/21, 12:14 PM   Waunita Schooner 06/25/2021, 12:11 PM

## 2021-06-25 NOTE — Progress Notes (Signed)
Inpatient Rehab Admissions Coordinator Note:   Per PT/OT recommendations, pt was screened for CIR candidacy by Gayland Curry, MS, CCC-SLP.  At this time we are recommending an inpatient rehab consult. AC will place consult order per protocol. Please contact me with questions.    Gayland Curry, [place], [place] Admissions Coordinator [phone] 06/25/21 5:28 PM

## 2021-06-25 NOTE — Progress Notes (Addendum)
Progress Note  Patient Name: Erika [name] Date of Encounter: 06/25/2021  Primary Cardiologist: [name], MD  Subjective   Ongoing L sided wkns/dysarthria.  No chest pain/dyspnea.  Inpatient Medications    Scheduled Meds:  aspirin  81 mg Oral Daily   benztropine  0.5 mg Oral QHS   chlorproMAZINE  100 mg Oral QHS   clopidogrel  75 mg Oral Daily   enoxaparin (LOVENOX) injection  40 mg Subcutaneous Q24H   FLUoxetine  60 mg Oral Daily   insulin aspart  0-5 Units Subcutaneous QHS   insulin aspart  0-9 Units Subcutaneous TID WC   insulin glargine  45 Units Subcutaneous BID   isosorbide mononitrate  30-60 mg Oral BID   lamoTRIgine  200 mg Oral QHS   nicotine  21 mg Transdermal Daily   pregabalin  225 mg Oral BID   rosuvastatin  20 mg Oral Daily   Continuous Infusions:  sodium chloride 75 mL/hr at 06/24/21 2052   cefTRIAXone (ROCEPHIN)  IV Stopped (06/24/21 1201)   doxycycline (VIBRAMYCIN) IV Stopped (06/25/21 0048)   PRN Meds: albuterol, clonazePAM, hydrALAZINE, nitroGLYCERIN, ondansetron (ZOFRAN) IV, oxyCODONE, senna-docusate   Vital Signs    Vitals:   06/25/21 0200 06/25/21 0300 06/25/21 0400 06/25/21 0600  BP: 128/71 124/72 118/67 (!) 150/80  Pulse: 97 (!) 101 (!) 101 100  Resp: 14 17 15 18   Temp:    (!) 97.5 F (36.4 C)  TempSrc:    Oral  SpO2: 90% 91% 92% 92%  Weight:      Height:        Intake/Output Summary (Last 24 hours) at 06/25/2021 0959 Last data filed at 06/25/2021 0048 Gross per 24 hour  Intake 1450 ml  Output --  Net 1450 ml   Filed Weights   06/24/21 0550  Weight: 84.4 kg    Physical Exam   GEN: Well nourished, well developed, in no acute distress.  HEENT: Grossly normal.  Neck: Supple, no JVD, carotid bruits, or masses. Cardiac: RRR, no murmurs, rubs, or gallops. No clubbing, cyanosis, edema.  Radials 2+, DP/PT diminished bilat. Respiratory:  Respirations regular and unlabored, diminished breath sounds bilat. GI: Soft,  nontender, nondistended, BS + x 4. MS: no deformity or atrophy. Skin: warm and dry, no rash. Neuro:  L facial droop, L sided wkns, dysarthria. Psych: AAOx3.  Normal affect.  Labs    Chemistry Recent Labs  Lab 06/24/21 0540 06/25/21 0740  NA 135  --   K 4.0  --   CL 98  --   CO2 25  --   GLUCOSE 306*  --   BUN 15  --   CREATININE 0.80  --   CALCIUM 9.9  --   PROT 7.2 6.7  ALBUMIN 3.8 3.6  AST 63* 52*  ALT 68* 59*  ALKPHOS 85 74  BILITOT 0.8 0.6  GFRNONAA >60  --   ANIONGAP 12  --      Hematology Recent Labs  Lab 06/24/21 0540  WBC 18.6*  RBC 5.07  HGB 15.4*  HCT 45.2  MCV 89.2  MCH 30.4  MCHC 34.1  RDW 13.9  PLT 326    Cardiac Enzymes  Recent Labs  Lab 06/24/21 0552 06/24/21 0812 06/24/21 1305  TROPONINIHS 106* 172* 250*      BNP Recent Labs  Lab 06/24/21 0540  BNP 33.3    Lipids  Lab Results  Component Value Date   CHOL 202 (H) 06/25/2021   HDL 29 (L)  06/25/2021   LDLCALC UNABLE TO CALCULATE IF TRIGLYCERIDE OVER 400 mg/dL 06/25/2021   LDLDIRECT 112.0 11/21/2020   TRIG 636 (H) 06/25/2021   CHOLHDL 7.0 06/25/2021    HbA1c  Lab Results  Component Value Date   HGBA1C 11.5 (A) 03/14/2021    Radiology    CT Angio Chest Pulmonary Embolism (PE) W or WO Contrast  Result Date: 06/24/2021 CLINICAL DATA:  Evaluate for pulmonary embolism. History of code stroke. EXAM: CT ANGIOGRAPHY CHEST WITH CONTRAST TECHNIQUE: Multidetector CT imaging of the chest was performed using the standard protocol during bolus administration of intravenous contrast. Multiplanar CT image reconstructions and MIPs were obtained to evaluate the vascular anatomy. CONTRAST:  13mL OMNIPAQUE IOHEXOL 350 MG/ML SOLN COMPARISON:  Chest CT 06/28/2016 FINDINGS: Cardiovascular: Suboptimal evaluation of the pulmonary arteries. Large amount of contrast is already in the aorta. Bilateral main pulmonary arteries are patent but cannot evaluate beyond the main pulmonary arteries.  Atherosclerotic disease in the thoracic aorta, post CABG. Calcifications in the native coronary arteries. Great vessels are patent. Atherosclerotic plaque involving the right common carotid artery and innominate artery. Normal caliber of the thoracic aorta. Mediastinum/Nodes: Postsurgical changes from median sternotomy and CABG. No significant lymph node enlargement. Lungs/Pleura: Trachea and mainstem bronchi are patent. No large pleural effusions. Patchy densities in the right upper lobe. 5 mm nodule in the superior segment of the right lower lobe has minimally changed since 2017 and likely benign. zPeripheral densities in the posterior right lower lobe are most compatible with atelectasis. Peripheral nodule in the posterior left lower lobe on sequence 6 image 49 appears stable. Upper Abdomen: Diffuse low density of the liver is compatible with steatosis. Musculoskeletal: No acute bone abnormality. Review of the MIP images confirms the above findings. IMPRESSION: 1. Suboptimal evaluation for pulmonary embolism. Pulmonary emboli cannot be confidently evaluated on this examination. 2. Patchy densities in the right upper lobe. Findings are nonspecific and could be related to areas of atelectasis versus mild infection. 3. Nodule in the superior segment of the right lower lobe has minimally changed since 2017 and likely benign. 4. Hepatic steatosis. 5.  Aortic Atherosclerosis (ICD10-I70.0). Electronically Signed   By: Markus Daft M.D.   On: 06/24/2021 10:55   MR BRAIN WO CONTRAST  Result Date: 06/24/2021 CLINICAL DATA:  Slurred speech, left facial droop, left arm weakness, code stroke follow-up EXAM: MRI HEAD WITHOUT CONTRAST TECHNIQUE: Multiplanar, multiecho pulse sequences of the brain and surrounding structures were obtained without intravenous contrast. COMPARISON:  None. FINDINGS: Brain: There is reduced diffusion in the parasagittal right pons. The right parietal and occipital infarcts identified on prior CT  imaging are chronic. There is associated T1 shortening reflecting laminar necrosis. This accounts for the hyperdensity seen on CT rather than hemorrhage. There is no intracranial mass or mass effect. Additional patchy foci of T2 hyperintensity in the supratentorial and pontine white matter are nonspecific but may reflect mild chronic microvascular ischemic changes. There are a few scattered foci of susceptibility hypointensity in the cerebral white matter, left basal ganglia, and right thalamus compatible with chronic microhemorrhages. There is no hydrocephalus or extra-axial fluid collection. Vascular: Major vessel flow voids at the skull base are preserved. Skull and upper cervical spine: Normal marrow signal is preserved. Sinuses/Orbits: Paranasal sinuses are aerated. Orbits are unremarkable. Other: Sella is unremarkable.  Mastoid air cells are clear. IMPRESSION: Acute right pontine perforator infarct. Right parietal and occipital infarcts are chronic. Mild chronic microvascular ischemic changes. Mild burden of scattered chronic microhemorrhages. Electronically Signed  By: Macy Mis M.D.   On: 06/24/2021 14:56   DG Chest Port 1 View  Result Date: 06/24/2021 CLINICAL DATA:  Code stroke. EXAM: PORTABLE CHEST 1 VIEW COMPARISON:  May 25 21 FINDINGS: Is 631 hours. Low lung volumes. Basilar atelectasis without focal consolidation or overt pulmonary edema. Interstitial markings are diffusely coarsened with chronic features. The cardiopericardial silhouette is within normal limits for size. Status post CABG. Telemetry leads overlie the chest. IMPRESSION: Low volume chest with basilar atelectasis. Electronically Signed   By: Misty Stanley M.D.   On: 06/24/2021 07:28   CT HEAD CODE STROKE WO CONTRAST  Addendum Date: 06/24/2021   ADDENDUM REPORT: 06/24/2021 06:27 ADDENDUM: Study discussed by telephone with Dr. Luvenia Starch SUNG on 06/24/2021 at 0619 hours. Electronically Signed   By: Genevie Ann M.D.   On: 06/24/2021 06:27    Result Date: 06/24/2021 CLINICAL DATA:  Code stroke. 50 year old female with left side weakness. Last known well midnight. History of right carotid endarterectomy. EXAM: CT HEAD WITHOUT CONTRAST TECHNIQUE: Contiguous axial images were obtained from the base of the skull through the vertex without intravenous contrast. COMPARISON:  Brain MRI 12/03/2012.  Head CT 02/25/2019. FINDINGS: Brain: Abnormal cortical and subcortical white matter hypodensity in both the superior right perirolandic cortex (series 3, image 23) and lateral right occipital pole (image 17) are new since 2020, but appears subacute versus chronic. Possible petechial hemorrhage associated with the latter. No superimposed acute cortically based infarct identified. Elsewhere gray-white matter differentiation is within normal limits. No midline shift, ventriculomegaly, mass effect, evidence of mass lesion, or other intracranial hemorrhage. Vascular: Calcified atherosclerosis at the skull base. No suspicious intracranial vascular hyperdensity. Skull: Negative. Sinuses/Orbits: Visualized paranasal sinuses and mastoids are clear. Other: Visualized orbits and scalp soft tissues are within normal limits. ASPECTS [facility] Stroke Program Early CT Score) Total score (0-10 with 10 being normal): 10 (suspected subacute or chronic cortical infarcts in the right hemisphere). IMPRESSION: 1. Subacute versus chronic appearing cortical infarcts in the right perirolandic and lateral right occipital regions are new since 2020. 2. No superimposed acute cortically based infarct or acute intracranial hemorrhage identified. ASPECTS 10. Electronically Signed: By: Genevie Ann M.D. On: 06/24/2021 06:07   CT ANGIO HEAD CODE STROKE  Result Date: 06/24/2021 CLINICAL DATA:  Slurred speech, left facial droop, left arm weakness, history of right CEA 2017 EXAM: CT ANGIOGRAPHY HEAD AND NECK CT PERFUSION BRAIN TECHNIQUE: Multidetector CT imaging of the head and neck was performed using  the standard protocol during bolus administration of intravenous contrast. Multiplanar CT image reconstructions and MIPs were obtained to evaluate the vascular anatomy. Carotid stenosis measurements (when applicable) are obtained utilizing NASCET criteria, using the distal internal carotid diameter as the denominator. Multiphase CT imaging of the brain was performed following IV bolus contrast injection. Subsequent parametric perfusion maps were calculated using RAPID software. CONTRAST:  166mL OMNIPAQUE IOHEXOL 350 MG/ML SOLN COMPARISON:  CT head earlier same day, CTA 04/08/2020 FINDINGS: CT HEAD FINDINGS Brain: Stable appearance of areas of hypoattenuation and loss of gray-white differentiation in the right parietal lobe and right occipital lobe. As before, there is possible petechial hemorrhage associated with the occipital infarct. No new hemorrhage or mass effect. Ventricles are stable in size. Vascular: No new findings. Skull: No new findings. Sinuses/Orbits: No new findings. Other: None. CTA NECK FINDINGS Aortic arch: Stable appearance of plaque at the great vessel origins. There is direct origin of the left vertebral artery from the arch. Right carotid system: Circumferential noncalcified plaque  along the common carotid is decreased. Decreased plaque at the bifurcation and proximal ICA with improved stenosis post endarterectomy. Left carotid system: Patent. Mixed plaque along the common carotid, bifurcation, and proximal internal carotid. Unchanged 50% stenosis of the proximal ICA. Vertebral arteries: Right vertebral artery is patent. There is unchanged severe stenosis at the origin of the left vertebral with minimal opacification of the P2 segment. Reconstitution is present at the C2-C3 level. Skeleton: Degenerative changes of the cervical spine. Other neck: No acute abnormality. Upper chest: No apical lung mass. Review of the MIP images confirms the above findings CTA HEAD FINDINGS Anterior circulation:  Intracranial internal carotid arteries are patent with mixed plaque causing moderate stenosis. Anterior and middle cerebral arteries are patent. Posterior circulation: Intracranial vertebral arteries are patent with diffuse atherosclerotic irregularity. Left vertebral artery is no longer well seen beyond PICA origin. Basilar artery is patent. Posterior cerebral arteries are patent. Bilateral posterior communicating arteries are present. Venous sinuses: As permitted by contrast timing, patent. Review of the MIP images confirms the above findings CT Brain Perfusion Findings: CBF (<30%) Volume: 35mL Perfusion (Tmax>6.0s) volume: 106mL Mismatch Volume: 83mL Infarction Location: None. IMPRESSION: Stable appearance of the brain.  No new intracranial hemorrhage. Perfusion imaging demonstrates no evidence of core infarction or penumbra. Less stringent criteria demonstrates elevation of T-max corresponding to right occipital infarct, which is probably subacute with petechial hemorrhage. The right parietal infarct is more likely subacute to chronic. Decreased stenosis and plaque of the distal right common carotid and proximal right internal carotid reflecting interval endarterectomy. Intracranial left vertebral artery is now no longer well seen beyond the PICA origin. Remains high-grade stenosis of the left vertebral origin with reconstitution distally in the neck. Remain significant atherosclerotic irregularity of the proximal left intracranial portion with high-grade stenosis likely accounting for loss of distal flow. Electronically Signed   By: Macy Mis M.D.   On: 06/24/2021 13:06    Telemetry    RSR to sinus tachycardia - mostly 90's - Personally Reviewed  Cardiac Studies   a. 06/2016 Cath: ostLM 40%, ostLAD 40%, pLAD 95%, ost-pLCx 60%, pLCx 95%, mLCx 60%, mRCA 95%, D2 50%, LVSF nl b. 07/2016 CABG x 4 (LIMA->LAD, VG->Diag, VG->OM, VG->RCA) c. 08/2016 Cath: 3VD w/ 4/4 patent grafts. LAD distal to LIMA has diff  dzs->Med rx d. 08/2020 Cath: 4/4 patent grafts, native 3VD. EF 55-65%-->Med Rx.  Patient Profile     TERINA MCELHINNY is a 50 y.o. female with a history of CAD status post CABG, carotid arterial disease status post right carotid endarterectomy, peripheral arterial disease status post prior left SFA stenting with subsequent to distal SFA occlusion, HFpEF, hypertension, hyperlipidemia, obesity, diabetes, and tobacco abuse, who presented 7/2 w/ L sided wkns and stroke, and was noted to have HsTrop elevation (106  172  250).  Assessment & Plan    1.  Acute R hemispheric stroke:  Ss of left sided wkns, l facial drrop, and garbled speech developing just after midnight on 7/2.  Brain MRI w/ acute R pontine perforator infarct and chronic R parietal and occipital infarcts.  No large vessel occlusions on CTA head/neck.  Neuro following.  Echo pending. No events on tele.  2.  CAD/Elevated HsTrop:   pt w/ a h/o severe native multivessel CAD s/p CABG x 4 in 2017 w/ 4/4 patent grafts on cath in 08/2020.  She has not been having chest pain or dyspnea @ home.  In setting of above, HsTrop noted to be elevated @  106  172  250.  I have ordered a repeat HsTrop this AM and will plan to follow to [facility].  Admission ECG w/ inferolateral ST depression - more pronounced than seen on prior ECGs. Awaiting echo.  Cont asa, plavix, nitrate.  ? blocker on hold in setting of permissive HTN (BPs actually better than usual despite not receiving meds).  3.  Essential HTN:  difficult to control in the outpt setting, though currently well-controlled on imdur only. Other home meds include max doses of carvedilol, amlodipine, and losartan, along w/ spiro 25, torsemide 20 bid, and clonidine 0.1 bid.  Permissive HTN per neuro.  Will look to resume part/all of home meds when appropriate.  4.  HL:  Followed in lipid clinic in [place].  Statin and repatha as outpt.  5.  PAD:  chronic L>R LE claudication w/ known dzs.  Stable ABIs in 02/2021.  Med  rx w/ asa, plavix, statin, repatha.  6.  Carotid arterial Dzs:  stable dzs w/ patent R CEA on u/s in 02/2021 and CTA 7/2.  7.  Hypoxia/? PNA:  O2 sats in 80's upon EMS arrival.  CTA chest inadequate for eval of PE.  CTA chest w/ patchy RUL densities.  O2 sats low 90's on 2lpm currently, though she desaturated into the high 80's throughout the night.  Afebrile.  Abx per IM.   8.  Tob Abuse:  smoking 1/2ppd prior to admission.  Cessation advised.  Signed, Murray Hodgkins, NP  06/25/2021, 9:59 AM    For questions or updates, please contact   Please consult [email or web address] for contact info under Cardiology/STEMI.    I have seen, examined the patient, and reviewed the above assessment and plan.  Changes to above are made where necessary.  On exam, RRR.  No ischemic cardiac symptoms.  Trop has peaked and is mostly flat.  Echo is pending.  Unless markedly changed echo, I would advise conservative cardiac management at this time.  No indication for invasive cardiac strategy currently.  Will  defer any plans for anticoagulation to neurology.  Co Sign: Thompson Grayer, MD 06/25/2021 3:03 PM

## 2021-06-25 NOTE — Progress Notes (Signed)
PHARMACIST - PHYSICIAN COMMUNICATION  CONCERNING: Antibiotic IV to Oral Route Change Policy  RECOMMENDATION: This patient is receiving doxycycline by the intravenous route.  Based on criteria approved by the Pharmacy and Therapeutics Committee, the antibiotic(s) is/are being converted to the equivalent oral dose form(s).   DESCRIPTION: These criteria include: Patient being treated for a respiratory tract infection, urinary tract infection, cellulitis or clostridium difficile associated diarrhea if on metronidazole The patient is not neutropenic and does not exhibit a GI malabsorption state The patient is eating (either orally or via tube) and/or has been taking other orally administered medications for a least 24 hours The patient is improving clinically and has a Tmax < 100.5  If you have questions about this conversion, please contact the [facility]  06/25/21

## 2021-06-25 NOTE — ED Notes (Signed)
Pt just got up to use restroom with use of walker. Unable to obtain urine specimen for further labs ordered. Hat placed in toilet to obtain the same when able to go again.

## 2021-06-26 DIAGNOSIS — I739 Peripheral vascular disease, unspecified: Secondary | ICD-10-CM

## 2021-06-26 DIAGNOSIS — E1169 Type 2 diabetes mellitus with other specified complication: Secondary | ICD-10-CM

## 2021-06-26 DIAGNOSIS — I25118 Atherosclerotic heart disease of native coronary artery with other forms of angina pectoris: Secondary | ICD-10-CM

## 2021-06-26 DIAGNOSIS — Z794 Long term (current) use of insulin: Secondary | ICD-10-CM

## 2021-06-26 LAB — COMPREHENSIVE METABOLIC PANEL WITH GFR
ALT: 47 U/L — ABNORMAL HIGH (ref 0–44)
AST: 32 U/L (ref 15–41)
Albumin: 3.4 g/dL — ABNORMAL LOW (ref 3.5–5.0)
Alkaline Phosphatase: 72 U/L (ref 38–126)
Anion gap: 7 (ref 5–15)
BUN: 12 mg/dL (ref 6–20)
CO2: 27 mmol/L (ref 22–32)
Calcium: 9.4 mg/dL (ref 8.9–10.3)
Chloride: 103 mmol/L (ref 98–111)
Creatinine, Ser: 0.57 mg/dL (ref 0.44–1.00)
GFR, Estimated: >60 mL/min
Glucose, Bld: 153 mg/dL — ABNORMAL HIGH (ref 70–99)
Potassium: 3.7 mmol/L (ref 3.5–5.1)
Sodium: 137 mmol/L (ref 135–145)
Total Bilirubin: 0.5 mg/dL (ref 0.3–1.2)
Total Protein: 6.4 g/dL — ABNORMAL LOW (ref 6.5–8.1)

## 2021-06-26 LAB — CBC
HCT: 37.6 % (ref 36.0–46.0)
Hemoglobin: 12.7 g/dL (ref 12.0–15.0)
MCH: 30 pg (ref 26.0–34.0)
MCHC: 33.8 g/dL (ref 30.0–36.0)
MCV: 88.7 fL (ref 80.0–100.0)
Platelets: 322 10*3/uL (ref 150–400)
RBC: 4.24 MIL/uL (ref 3.87–5.11)
RDW: 13.8 % (ref 11.5–15.5)
WBC: 11.5 10*3/uL — ABNORMAL HIGH (ref 4.0–10.5)
nRBC: 0 % (ref 0.0–0.2)

## 2021-06-26 LAB — GLUCOSE, CAPILLARY
Glucose-Capillary: 158 mg/dL — ABNORMAL HIGH (ref 70–99)
Glucose-Capillary: 193 mg/dL — ABNORMAL HIGH (ref 70–99)
Glucose-Capillary: 173 mg/dL — ABNORMAL HIGH (ref 70–99)
Glucose-Capillary: 182 mg/dL — ABNORMAL HIGH (ref 70–99)

## 2021-06-26 LAB — STREP PNEUMONIAE URINARY ANTIGEN: Strep Pneumo Urinary Antigen: NEGATIVE

## 2021-06-26 MED ORDER — EZETIMIBE 10 MG PO TABS
10.0000 mg | ORAL_TABLET | Freq: Every day | ORAL | Status: DC
  Administered 2021-06-26 – 2021-06-28 (×3): 10 mg via ORAL
  Filled 2021-06-26 (×3): qty 1

## 2021-06-26 MED ORDER — CARVEDILOL 25 MG PO TABS
37.5000 mg | ORAL_TABLET | Freq: Two times a day (BID) | ORAL | Status: DC
  Administered 2021-06-26 – 2021-06-27 (×2): 37.5 mg via ORAL
  Filled 2021-06-26 (×2): qty 1

## 2021-06-26 MED ORDER — CHLORPROMAZINE HCL 100 MG PO TABS
100.0000 mg | ORAL_TABLET | Freq: Every day | ORAL | Status: DC
  Administered 2021-06-26 – 2021-06-27 (×2): 100 mg via ORAL
  Filled 2021-06-26 (×3): qty 1

## 2021-06-26 MED ORDER — HOME MED STORE IN PYXIS: 1.0000 | Freq: Every day | Status: DC

## 2021-06-26 MED ORDER — DM-GUAIFENESIN ER 30-600 MG PO TB12
1.0000 | ORAL_TABLET | Freq: Two times a day (BID) | ORAL | Status: DC
  Administered 2021-06-26 – 2021-06-28 (×5): 1 via ORAL
  Filled 2021-06-26 (×5): qty 1

## 2021-06-26 MED ORDER — SPIRONOLACTONE 25 MG PO TABS
25.0000 mg | ORAL_TABLET | Freq: Every day | ORAL | Status: DC
  Administered 2021-06-26 – 2021-06-28 (×3): 25 mg via ORAL
  Filled 2021-06-26 (×3): qty 1

## 2021-06-26 MED ORDER — TORSEMIDE 20 MG PO TABS
20.0000 mg | ORAL_TABLET | Freq: Two times a day (BID) | ORAL | Status: DC
  Administered 2021-06-26 – 2021-06-27 (×2): 20 mg via ORAL
  Filled 2021-06-26 (×2): qty 1

## 2021-06-26 MED ORDER — LOSARTAN POTASSIUM 50 MG PO TABS
100.0000 mg | ORAL_TABLET | Freq: Every day | ORAL | Status: DC
  Administered 2021-06-26 – 2021-06-27 (×2): 100 mg via ORAL
  Filled 2021-06-26 (×2): qty 2

## 2021-06-26 MED ORDER — CARVEDILOL 3.125 MG PO TABS: 3.1250 mg | ORAL_TABLET | Freq: Two times a day (BID) | ORAL | Status: DC

## 2021-06-26 NOTE — Progress Notes (Signed)
PROGRESS NOTE    Erika [name]   MLY:[id number]  DOB: [birth year]  PCP: Lesleigh Noe, MD    DOA: 06/24/2021 LOS: 2   Assessment & Plan   Principal Problem:   Stroke [facility]) Active Problems:   HLD (hyperlipidemia)   ADJUSTMENT DISORDER WITH MIXED FEATURES   MDD (major depressive disorder)   Elevated troponin   Tobacco abuse   Essential hypertension   Type 2 diabetes mellitus with other specified complication (HCC)   CAD (coronary artery disease)   Severe sepsis (HCC)   PVD (peripheral vascular disease) (HCC)   Chronic diastolic CHF (congestive heart failure) ([place])   Fall   Abnormal LFTs   Acute respiratory failure with hypoxia (HCC)   Acute Ischemic CVA of right pontine perforator -  Stroke risk factors: CAD, carotid stenosis, HLD, HTN, obesity, PAD, tobacco abuse and DM2 Neurology following - see their recs. PT and OT recommend CIR - consult underway. Telemetry. Plavix + ASA, Crestor BP control (outside window to need permHTN) Strict glycemic control Tobacco cessation Neuro checks   Essential hypertension - soft BP this AM with meds still on hold, otherwise has been elevated overnight. --resume losartan today --continue Coreg (on lower dose here)   --PRN hydralazine for SBP>150 --continue holding amlodipine, clonidine, torsemide and spironolactone  Severe sepsis due to RUL Pneumonia -  Acute respiratory failure with hypoxia and sepsis:  Presented with leukocytosis, tachycardia, RUL patchy density on CTA chest consistent with pneumonia. Lactici acidosis reflects organ dysfunction and severe sepsis. Started on empiric CAP coverage with Rocephin/Doxy on admission. Sepsis physiology and lactic acidosis resolved. --Continue Unasyn --May switch to Augmentin tomorrow --Follow blood and sputum cultures, urine antigen of Legionella and strep --stop IV fluids  Cluster headaches - 100% O2 by NRB mask PRN  Cont home PO analgesics  HLD - Crestor.  On Repatha  q14days, last dose on 6/26   ADJUSTMENT DISORDER WITH MIXED FEATURES MDD (major depressive disorder): -Cogentin, Thorazine (pts own med), Klonopin, Lamictal   Elevated troponin due to demand ischemia Hx of CAD: s/p of CABG.  No chest pain, likely due to demand ischemia [facility] cardiology consulted - Crestor, plavix, ASA started per neuro   Tobacco abuse - Nicotine patch   Type 2 diabetes mellitus with other specified complication - Recent C1E 11.5, poorly controlled.  Patient is taking Trulicity, Farxiga, glargine insulin -Sliding scale insulin -Decreased glargine dose from 120 to 90 unit daily -adjust insulin as needed for inpatient goal 140-180   PVD - On Crestor and Plavix   Chronic diastolic CHF - currently compensated.  2D echo on 06/27/2016 showed EF of 50-55% with grade 1 diastolic dysfunction.   No pulmonary edema on chest x-ray.  BNP 33.3,  -hold torsemide and spironolactone -cont on lower dose Coreg   Fall due to weakness from CVA -PT/OT - CIR recommended, placement pending   Abnormal LFTs: mild -hepatis panel negative -Avoid using Tylenol     Patient BMI: Body mass index is 29.13 kg/m.   DVT prophylaxis: enoxaparin (LOVENOX) injection 40 mg Start: 06/24/21 2200   Diet:  Diet Orders (From admission, onward)     Start     Ordered   06/24/21 1142  DIET DYS 2 Room service appropriate? Yes with Assist; Fluid consistency: Thin  Diet effective now       Comments: NO STRAWS!!!   Extra Gravy on meats, potatoes.  Yogurt TID meals, pudding, Cream Soups -- NO Broth based soups right now.  Hot  cereals w/ butter/sugar.  Magic Cup in place of ice cream right now.  Question Answer Comment  Room service appropriate? Yes with Assist   Fluid consistency: Thin      06/24/21 1149              Code Status: Full Code   Brief Narrative / Hospital Course to Date:   50 y.o. female with medical history significant of prior CVA in ___, HTN, HLD, DM, tobacco abuse, PAD,  CAD, CABG, dCHF, peptic foot drop, renal artery stenosis, carotid artery stenosis (s/p of right CEA 2017), PTSD, adjustment disorder, who presented on 06/24/21 with slurred speech, left facial droop, left arm weakness.  She was found to have Right Pontine perforator ischemic infarction on MRI brain, in addition to chronic/prior Right Parietal and Occipital infarcts.  Neurology consulted.  Additional evaluation underway.  PT and OT are recommending CIR for rehab and patient is agreeable.    Subjective 06/26/21    Pt seen with husband at bedside.  Her speech and left side strength both little better today.  BP was low early AM despite all her BP meds on hold.  Reports ongoing cough but difficulty producing phlegm.  No fever/chills.  She remains eager and agreeable to CIR for rehab.       Disposition Plan & Communication   Status is: Inpatient  Remains inpatient appropriate because:Ongoing diagnostic testing needed not appropriate for outpatient work up, CIR consult pending for rehab  Dispo: The patient is from: Home              Anticipated d/c is to: CIR              Patient currently is not medically stable to d/c.   Difficult to place patient No   Family Communication: husband at bedside on round    Consults, Procedures, Significant Events   Consultants:  neurology  Procedures:  none  Antimicrobials:  Anti-infectives (From admission, onward)    Start     Dose/Rate Route Frequency Ordered Stop   06/25/21 2200  doxycycline (VIBRA-TABS) tablet 100 mg  Status:  Discontinued        100 mg Oral Every 12 hours 06/25/21 1028 06/25/21 1202   06/25/21 1215  Ampicillin-Sulbactam (UNASYN) 3 g in sodium chloride 0.9 % 100 mL IVPB        3 g 200 mL/hr over 30 Minutes Intravenous Every 6 hours 06/25/21 1202     06/24/21 1145  cefTRIAXone (ROCEPHIN) 1 g in sodium chloride 0.9 % 100 mL IVPB  Status:  Discontinued        1 g 200 mL/hr over 30 Minutes Intravenous Daily 06/24/21 1136 06/25/21  1202   06/24/21 1145  doxycycline (VIBRAMYCIN) 100 mg in sodium chloride 0.9 % 250 mL IVPB  Status:  Discontinued        100 mg 125 mL/hr over 120 Minutes Intravenous 2 times daily 06/24/21 1136 06/25/21 1202         Micro    Objective   Vitals:   06/25/21 2005 06/26/21 0342 06/26/21 0725 06/26/21 1149  BP: (!) 194/99 (!) 164/76 95/81 (!) 156/70  Pulse: 96 91 75 82  Resp: 18     Temp: 98.7 F (37.1 C) 98.6 F (37 C) 98.2 F (36.8 C) 98.4 F (36.9 C)  TempSrc:   Oral Oral  SpO2: 94% 92% 95% 95%  Weight:      Height:        Intake/Output Summary (Last  24 hours) at 06/26/2021 1411 Last data filed at 06/26/2021 1101 Gross per 24 hour  Intake 2547.32 ml  Output 750 ml  Net 1797.32 ml   Filed Weights   06/24/21 0550  Weight: 84.4 kg    Physical Exam:  General exam: awake, alert, no acute distress HEENT: moist mucus membranes, hearing grossly normal  Respiratory system: CTAB, right-sided rhonchi, no wheezes, normal respiratory effort at rest Cardiovascular system: normal S1/S2, RRR, no pedal edema.   Central nervous system: A&O x4, dysarthric speech further improved, L facial droop, LUE motor improving able to hold arm up at shoulder level without dropping, no new deficits noted Psychiatry: normal mood, congruent affect, judgement and insight appear normal  Labs   Data Reviewed: I have personally reviewed following labs and imaging studies  CBC: Recent Labs  Lab 06/24/21 0540 06/26/21 0616  WBC 18.6* 11.5*  NEUTROABS 15.7*  --   HGB 15.4* 12.7  HCT 45.2 37.6  MCV 89.2 88.7  PLT 326 924   Basic Metabolic Panel: Recent Labs  Lab 06/24/21 0540 06/26/21 0616  NA 135 137  K 4.0 3.7  CL 98 103  CO2 25 27  GLUCOSE 306* 153*  BUN 15 12  CREATININE 0.80 0.57  CALCIUM 9.9 9.4   GFR: Estimated Creatinine Clearance: 97 mL/min (by C-G formula based on SCr of 0.57 mg/dL). Liver Function Tests: Recent Labs  Lab 06/24/21 0540 06/25/21 0740 06/26/21 0616   AST 63* 52* 32  ALT 68* 59* 47*  ALKPHOS 85 74 72  BILITOT 0.8 0.6 0.5  PROT 7.2 6.7 6.4*  ALBUMIN 3.8 3.6 3.4*   No results for input(s): LIPASE, AMYLASE in the last 168 hours. No results for input(s): AMMONIA in the last 168 hours. Coagulation Profile: Recent Labs  Lab 06/24/21 0540  INR 1.0   Cardiac Enzymes: No results for input(s): CKTOTAL, CKMB, CKMBINDEX, TROPONINI in the last 168 hours. BNP (last 3 results) No results for input(s): PROBNP in the last 8760 hours. HbA1C: No results for input(s): HGBA1C in the last 72 hours. CBG: Recent Labs  Lab 06/24/21 2055 06/25/21 1245 06/25/21 2122 06/26/21 0728 06/26/21 1234  GLUCAP 231* 222* 227* 158* 193*   Lipid Profile: Recent Labs    06/25/21 0740  CHOL 202*  HDL 29*  LDLCALC UNABLE TO CALCULATE IF TRIGLYCERIDE OVER 400 mg/dL  TRIG 636*  CHOLHDL 7.0  LDLDIRECT 99.6*   Thyroid Function Tests: No results for input(s): TSH, T4TOTAL, FREET4, T3FREE, THYROIDAB in the last 72 hours. Anemia Panel: No results for input(s): VITAMINB12, FOLATE, FERRITIN, TIBC, IRON, RETICCTPCT in the last 72 hours. Sepsis Labs: Recent Labs  Lab 06/24/21 1305 06/25/21 0748 06/25/21 1111  PROCALCITON <0.10  --   --   LATICACIDVEN 2.5* 1.6 2.2*    Recent Results (from the past 240 hour(s))  Resp Panel by RT-PCR (Flu A&B, Covid) Nasopharyngeal Swab     Status: None   Collection Time: 06/24/21  6:30 AM   Specimen: Nasopharyngeal Swab; Nasopharyngeal(NP) swabs in vial transport medium  Result Value Ref Range Status   SARS Coronavirus 2 by RT PCR NEGATIVE NEGATIVE Final    Comment: (NOTE) SARS-CoV-2 target nucleic acids are NOT DETECTED.  The SARS-CoV-2 RNA is generally detectable in upper respiratory specimens during the acute phase of infection. The lowest concentration of SARS-CoV-2 viral copies this assay can detect is 138 copies/mL. A negative result does not preclude SARS-Cov-2 infection and should not be used as the sole  basis for treatment or  other patient management decisions. A negative result may occur with  improper specimen collection/handling, submission of specimen other than nasopharyngeal swab, presence of viral mutation(s) within the areas targeted by this assay, and inadequate number of viral copies(<138 copies/mL). A negative result must be combined with clinical observations, patient history, and epidemiological information. The expected result is Negative.  Fact Sheet for Patients:  EntrepreneurPulse.com.au  Fact Sheet for Healthcare Providers:  IncredibleEmployment.be  This test is no t yet approved or cleared by the [place] FDA and  has been authorized for detection and/or diagnosis of SARS-CoV-2 by FDA under an Emergency Use Authorization (EUA). This EUA will remain  in effect (meaning this test can be used) for the duration of the COVID-19 declaration under Section 564(b)(1) of the Act, 21 U.S.C.section 360bbb-3(b)(1), unless the authorization is terminated  or revoked sooner.       Influenza A by PCR NEGATIVE NEGATIVE Final   Influenza B by PCR NEGATIVE NEGATIVE Final    Comment: (NOTE) The Xpert Xpress SARS-CoV-2/FLU/RSV plus assay is intended as an aid in the diagnosis of influenza from Nasopharyngeal swab specimens and should not be used as a sole basis for treatment. Nasal washings and aspirates are unacceptable for Xpert Xpress SARS-CoV-2/FLU/RSV testing.  Fact Sheet for Patients: EntrepreneurPulse.com.au  Fact Sheet for Healthcare Providers: IncredibleEmployment.be  This test is not yet approved or cleared by the [place] FDA and has been authorized for detection and/or diagnosis of SARS-CoV-2 by FDA under an Emergency Use Authorization (EUA). This EUA will remain in effect (meaning this test can be used) for the duration of the COVID-19 declaration under Section 564(b)(1) of the Act,  21 U.S.C. section 360bbb-3(b)(1), unless the authorization is terminated or revoked.  Performed at [facility], [address] [place], [place] [address]   Culture, blood (routine x 2) Call MD if unable to obtain prior to antibiotics being given     Status: None (Preliminary result)   Collection Time: 06/25/21  7:40 AM   Specimen: BLOOD  Result Value Ref Range Status   Specimen Description BLOOD BLOOD RIGHT FOREARM  Final   Special Requests   Final    BOTTLES DRAWN AEROBIC AND ANAEROBIC Blood Culture adequate volume   Culture   Final    NO GROWTH < 24 HOURS Performed at [facility], [address] [place], [place] [address]    Report Status PENDING  Incomplete  Culture, blood (routine x 2) Call MD if unable to obtain prior to antibiotics being given     Status: None (Preliminary result)   Collection Time: 06/25/21  7:48 AM   Specimen: BLOOD  Result Value Ref Range Status   Specimen Description BLOOD RIGHT WRIST  Final   Special Requests   Final    BOTTLES DRAWN AEROBIC AND ANAEROBIC Blood Culture adequate volume   Culture   Final    NO GROWTH < 24 HOURS Performed at [facility], [address] [place], [place] [address]    Report Status PENDING  Incomplete      Imaging Studies   MR BRAIN WO CONTRAST  Result Date: 06/24/2021 CLINICAL DATA:  Slurred speech, left facial droop, left arm weakness, code stroke follow-up EXAM: MRI HEAD WITHOUT CONTRAST TECHNIQUE: Multiplanar, multiecho pulse sequences of the brain and surrounding structures were obtained without intravenous contrast. COMPARISON:  None. FINDINGS: Brain: There is reduced diffusion in the parasagittal right pons. The right parietal and occipital infarcts identified on prior CT imaging are chronic.  There is associated T1 shortening reflecting laminar necrosis. This accounts for the hyperdensity seen on CT rather than hemorrhage. There is no intracranial mass or mass effect. Additional  patchy foci of T2 hyperintensity in the supratentorial and pontine white matter are nonspecific but may reflect mild chronic microvascular ischemic changes. There are a few scattered foci of susceptibility hypointensity in the cerebral white matter, left basal ganglia, and right thalamus compatible with chronic microhemorrhages. There is no hydrocephalus or extra-axial fluid collection. Vascular: Major vessel flow voids at the skull base are preserved. Skull and upper cervical spine: Normal marrow signal is preserved. Sinuses/Orbits: Paranasal sinuses are aerated. Orbits are unremarkable. Other: Sella is unremarkable.  Mastoid air cells are clear. IMPRESSION: Acute right pontine perforator infarct. Right parietal and occipital infarcts are chronic. Mild chronic microvascular ischemic changes. Mild burden of scattered chronic microhemorrhages. Electronically Signed   By: Macy Mis M.D.   On: 06/24/2021 14:56   ECHOCARDIOGRAM COMPLETE  Result Date: 06/25/2021    ECHOCARDIOGRAM REPORT   Patient Name:   Erika [name] Date of Exam: 06/25/2021 Medical Rec #:  [id number]           Height:       67.0 in Accession #:    [id number]          Weight:       186.0 lb Date of Birth:  05-02-1971          BSA:          1.961 m Patient Age:    80 years            BP:           152/74 mmHg Patient Gender: F                   HR:           78 bpm. Exam Location:  [facility] Procedure: 2D Echo Indications:     Stroke  History:         Patient has prior history of Echocardiogram examinations. CAD                  and Previous Myocardial Infarction; Risk Factors:Diabetes and                  Hypertension.  Sonographer:     L Thornton-Maynard Referring Phys:  [id number] Soledad Gerlach NIU Diagnosing Phys: Ida Rogue MD IMPRESSIONS  1. Left ventricular ejection fraction, by estimation, is 60 to 65%. The left ventricle has normal function. The left ventricle has no regional wall motion abnormalities. Left ventricular diastolic parameters were  normal.  2. Right ventricular systolic function is normal. The right ventricular size is normal. Tricuspid regurgitation signal is inadequate for assessing PA pressure.  3. The mitral valve is normal in structure. No evidence of mitral valve regurgitation. No evidence of mitral stenosis.  4. The aortic valve is normal in structure. Aortic valve regurgitation is not visualized. No aortic stenosis is present.  5. The inferior vena cava is normal in size with greater than 50% respiratory variability, suggesting right atrial pressure of 3 mmHg. FINDINGS  Left Ventricle: Left ventricular ejection fraction, by estimation, is 60 to 65%. The left ventricle has normal function. The left ventricle has no regional wall motion abnormalities. The left ventricular internal cavity size was normal in size. There is  no left ventricular hypertrophy. Left ventricular diastolic parameters were normal. Right Ventricle: The right ventricular size is normal. No increase  in right ventricular wall thickness. Right ventricular systolic function is normal. Tricuspid regurgitation signal is inadequate for assessing PA pressure. Left Atrium: Left atrial size was normal in size. Right Atrium: Right atrial size was normal in size. Pericardium: There is no evidence of pericardial effusion. Mitral Valve: The mitral valve is normal in structure. No evidence of mitral valve regurgitation. No evidence of mitral valve stenosis. Tricuspid Valve: The tricuspid valve is normal in structure. Tricuspid valve regurgitation is not demonstrated. No evidence of tricuspid stenosis. Aortic Valve: The aortic valve is normal in structure. Aortic valve regurgitation is not visualized. No aortic stenosis is present. Aortic valve mean gradient measures 4.0 mmHg. Aortic valve peak gradient measures 7.5 mmHg. Aortic valve area, by VTI measures 2.57 cm. Pulmonic Valve: The pulmonic valve was normal in structure. Pulmonic valve regurgitation is not visualized. No  evidence of pulmonic stenosis. Aorta: The aortic root is normal in size and structure. Venous: The inferior vena cava is normal in size with greater than 50% respiratory variability, suggesting right atrial pressure of 3 mmHg. IAS/Shunts: No atrial level shunt detected by color flow Doppler.  LEFT VENTRICLE PLAX 2D LVIDd:         3.88 cm  Diastology LVIDs:         2.21 cm  LV e' medial:    4.35 cm/s LV PW:         1.51 cm  LV E/e' medial:  18.5 LV IVS:        1.22 cm  LV e' lateral:   6.31 cm/s LVOT diam:     2.00 cm  LV E/e' lateral: 12.8 LV SV:         65 LV SV Index:   33 LVOT Area:     3.14 cm  RIGHT VENTRICLE RV S prime:     9.68 cm/s TAPSE (M-mode): 1.6 cm LEFT ATRIUM             Index LA diam:        3.60 cm 1.84 cm/m LA Vol (A2C):   42.1 ml 21.47 ml/m LA Vol (A4C):   36.0 ml 18.36 ml/m LA Biplane Vol: 39.1 ml 19.94 ml/m  AORTIC VALVE AV Area (Vmax):    2.20 cm AV Area (Vmean):   2.44 cm AV Area (VTI):     2.57 cm AV Vmax:           137.00 cm/s AV Vmean:          98.300 cm/s AV VTI:            0.253 m AV Peak Grad:      7.5 mmHg AV Mean Grad:      4.0 mmHg LVOT Vmax:         96.00 cm/s LVOT Vmean:        76.400 cm/s LVOT VTI:          0.207 m LVOT/AV VTI ratio: 0.82  AORTA Ao Root diam: 3.60 cm MV E velocity: 80.60 cm/s MV A velocity: 88.70 cm/s  SHUNTS MV E/A ratio:  0.91        Systemic VTI:  0.21 m                            Systemic Diam: 2.00 cm Ida Rogue MD Electronically signed by Ida Rogue MD Signature Date/Time: 06/25/2021/6:43:21 PM    Final      Medications   Scheduled Meds:  aspirin  81 mg Oral Daily   benztropine  0.5 mg Oral QHS   carvedilol  37.5 mg Oral BID WC   chlorproMAZINE  100 mg Oral QHS   clopidogrel  75 mg Oral Daily   dextromethorphan-guaiFENesin  1 tablet Oral BID   enoxaparin (LOVENOX) injection  40 mg Subcutaneous Q24H   ezetimibe  10 mg Oral Daily   FLUoxetine  60 mg Oral Daily   insulin aspart  0-5 Units Subcutaneous QHS   insulin aspart  0-9 Units  Subcutaneous TID WC   insulin glargine  45 Units Subcutaneous BID   isosorbide mononitrate  30-60 mg Oral BID   lamoTRIgine  200 mg Oral QHS   losartan  100 mg Oral Daily   nicotine  21 mg Transdermal Daily   pregabalin  225 mg Oral BID   rosuvastatin  20 mg Oral Daily   Continuous Infusions:  ampicillin-sulbactam (UNASYN) IV 3 g (06/26/21 1312)       LOS: 2 days    Time spent: 30 minutes with > 50% spent at bedside and in coordination of care.    Ezekiel Slocumb, DO [facility]  06/26/2021, 2:11 PM      If 7PM-7AM, please contact night-coverage. How to contact the [facility] Attending or Consulting provider [place] or covering provider during after hours [place], for this patient?    Check the care team in [facility] and look for a) attending/consulting [facility] provider listed and b) the [facility] team listed Log into [email or web address] and use [place]'s universal password to access. If you do not have the password, please contact the hospital operator. Locate the [facility] provider you are looking for under [facility] and page to a number that you can be directly reached. If you still have difficulty reaching the provider, please page the [facility] (Director on Call) for the Hospitalists listed on amion for assistance.

## 2021-06-26 NOTE — PMR Pre-admission (Signed)
PMR Admission Coordinator Pre-Admission Assessment  Patient: Erika [name] is an 50 y.o., female MRN: [id number] DOB: 1971/02/23 Height: 5\' 7"  (170.2 cm) Weight: 84.4 kg  Insurance Information HMO: yes    PPO:      PCP:      IPA:      80/20:      OTHER:  PRIMARY: [employer]      Policy#: [id number]      Subscriber: patient CM Name: Serina Cowper      Phone#: [phone]     Fax#: [phone], I received Auth from Luisa Hart at [facility] on 7/6 for admission 7/6-7/12, with clinical updates due 5/64 Pre-Cert#: [id number]      Employer:  Benefits:  Phone #: Online - [email or web address] EEff Date: 12/25/2019 - 12/23/2021 Deductible: $1,250 ($1,250 met) OOP Max: $4,500 ($3,444.02 met) CIR: 80% coverage, 20% co-insurance SNF: 80% coverage, 20% co-insurance Outpatient: $50 copay; limited to 20 visits/cal yr (20 remaining) Home Health:  80% coverage, 20% co-insurance DME: 100% coverage , auth required Providers: in-network  SECONDARY: Medicare A      Policy#: [id number]     Phone#: verified eligibility via [employer] on 06/26/21  Financial Counselor:       Phone#:   The "Data Collection Information Summary" for patients in Inpatient Rehabilitation Facilities with attached "Privacy Act [place] Records" was provided and verbally reviewed with: Patient  Emergency Contact Information Contact Information     Name Relation Home Work Mobile   Hunter Spouse  [phone]       Current Medical History  Patient Admitting Diagnosis: CVA History of Present Illness:Pt is a 50 y/o  woman with past medical history significant for HTN, HLD, DM, tobacco abuse, PAD, CAD, CABG, dCHF, peptic foot drop, renal artery stenosis, CAD (s/p of R CEA 2017), PTSD, adjustment disorder, MI, obesity admitted on 06/24/21 with c/c of slurred speech, L facial droop & LUE weakness. MRI revealed acute R pontine perforator infarct with chronic R parietal & occipital infarcts.  During admission, she was also found to have acute respiratory failure 2/2 RUL pneumonia and started on Roecephin/Doxy/Unasyn. Pt. Also with cluster headaches, husband reports she sometimes needs 15L of oxygen at home when these occur. She has orders for NRB PRN 100% 02    Complete NIHSS TOTAL: 6  Patient's medical record from [facility] has been reviewed by the rehabilitation admission coordinator and physician.  Past Medical History  Past Medical History:  Diagnosis Date   Arthralgia of temporomandibular joint    CAD, multiple vessel    a. 06/2016 Cath: ostLM 40%, ostLAD 40%, pLAD 95%, ost-pLCx 60%, pLCx 95%, mLCx 60%, mRCA 95%, D2 50%, LVSF nl;  b. 07/2016 CABG x 4 (LIMA->LAD, VG->Diag, VG->OM, VG->RCA); c. 08/2016 Cath: 3VD w/ 4/4 patent grafts. LAD distal to LIMA has diff dzs->Med rx; d. 08/2020 Cath: 4/4 patent grafts, native 3VD. EF 55-65%-->Med Rx.   Carotid arterial disease ([place])    a. 07/2016 s/p R CEA; b. 02/2021 U/S: RICA 76-28%, LICA 3-15%.   Clotting disorder ([place])    Depression    Diastolic dysfunction    a. 06/2016 Echo: EF 50-55%, mild inf wall HK, GR1DD, mild MR, RV sys fxn nl, mildly dilated LA, PASP nl   Fatty liver disease, nonalcoholic 1761   History of blood transfusion    with heart surgery   HLD (hyperlipidemia)    Labile hypertension    a. prior renal ngiogram negative for RAS in 03/2016; b. catecholamines and  metanephrines normal, mildly elevated renin with normal aldosterone and normal ratio in 02/2016   Myocardial infarction [facility]) 2017   Obesity    PAD (peripheral artery disease) ([place])    a. 09/2018 s/p L SFA stenting; b. 07/2019 Periph Angio: Patent m/d L SFA stent w/ 100% L SFA distal to stent. L AT 100d, L Peroneal diff dzs-->Med Rx; c. 02/2021 ABIs: stable @ 0.61 on R and 0.46 on L.   PTSD (post-traumatic stress disorder)    Tobacco abuse    Type 2 diabetes mellitus ([place]) 12/2015    Family History   family history includes Alcohol abuse in her father;  Anxiety disorder in her sister; Diabetes in her father and mother; Drug abuse in her father; Heart disease in her father; Stroke in her sister. She was adopted.  Prior Rehab/Hospitalizations Has the patient had prior rehab or hospitalizations prior to admission? No  Has the patient had major surgery during 100 days prior to admission? No   Current Medications  Current Facility-Administered Medications:    albuterol (PROVENTIL) (2.5 MG/3ML) 0.083% nebulizer solution 3 mL, 3 mL, Nebulization, Q4H PRN, Ivor Costa, MD   Ampicillin-Sulbactam (UNASYN) 3 g in sodium chloride 0.9 % 100 mL IVPB, 3 g, Intravenous, Q6H, Griffith, Kelly A, DO, Last Rate: 200 mL/hr at 06/26/21 1312, 3 g at 06/26/21 1312   aspirin chewable tablet 81 mg, 81 mg, Oral, Daily, Theora Gianotti, NP, 81 mg at 06/26/21 6967   benztropine (COGENTIN) tablet 0.5 mg, 0.5 mg, Oral, QHS, Ivor Costa, MD, 0.5 mg at 06/24/21 2101   carvedilol (COREG) tablet 37.5 mg, 37.5 mg, Oral, BID WC, Theora Gianotti, NP   chlorproMAZINE (THORAZINE) tablet 100 mg, 100 mg, Oral, QHS, Niu, Soledad Gerlach, MD, 100 mg at 06/24/21 2222   clonazePAM (KLONOPIN) tablet 0.5 mg, 0.5 mg, Oral, TID PRN, Ivor Costa, MD, 0.5 mg at 06/24/21 1632   clopidogrel (PLAVIX) tablet 75 mg, 75 mg, Oral, Daily, Ivor Costa, MD, 75 mg at 06/26/21 0840   dextromethorphan-guaiFENesin (Filer DM) 30-600 MG per 12 hr tablet 1 tablet, 1 tablet, Oral, BID, Nicole Kindred A, DO, 1 tablet at 06/26/21 1259   enoxaparin (LOVENOX) injection 40 mg, 40 mg, Subcutaneous, Q24H, Ivor Costa, MD, 40 mg at 06/25/21 2120   ezetimibe (ZETIA) tablet 10 mg, 10 mg, Oral, Daily, Gollan, Kathlene November, MD   FLUoxetine (PROZAC) capsule 60 mg, 60 mg, Oral, Daily, Ivor Costa, MD, 60 mg at 06/26/21 0840   home med stored in pyxis 1 each, 1 each, Oral, QHS, Nicole Kindred A, DO   hydrALAZINE (APRESOLINE) injection 5 mg, 5 mg, Intravenous, Q2H PRN, Nicole Kindred A, DO   insulin aspart (novoLOG)  injection 0-5 Units, 0-5 Units, Subcutaneous, QHS, Ivor Costa, MD, 2 Units at 06/25/21 2129   insulin aspart (novoLOG) injection 0-9 Units, 0-9 Units, Subcutaneous, TID WC, Ivor Costa, MD, 2 Units at 06/26/21 1259   insulin glargine (LANTUS) injection 45 Units, 45 Units, Subcutaneous, BID, Ivor Costa, MD, 45 Units at 06/26/21 1132   isosorbide mononitrate (IMDUR) 24 hr tablet 30-60 mg, 30-60 mg, Oral, BID, Ivor Costa, MD, 60 mg at 06/26/21 0840   lamoTRIgine (LAMICTAL) tablet 200 mg, 200 mg, Oral, QHS, Ivor Costa, MD, 200 mg at 06/25/21 2121   losartan (COZAAR) tablet 100 mg, 100 mg, Oral, Daily, Nicole Kindred A, DO   nicotine (NICODERM CQ - dosed in mg/24 hours) patch 21 mg, 21 mg, Transdermal, Daily, Ivor Costa, MD, 21 mg at 06/26/21 1131  nitroGLYCERIN (NITROSTAT) SL tablet 0.4 mg, 0.4 mg, Sublingual, Q5 min PRN, Ivor Costa, MD   ondansetron [facility]) injection 4 mg, 4 mg, Intravenous, Q8H PRN, Ivor Costa, MD   oxyCODONE (Oxy IR/ROXICODONE) immediate release tablet 5 mg, 5 mg, Oral, Q6H PRN, Ivor Costa, MD, 5 mg at 06/26/21 1259   pregabalin (LYRICA) capsule 225 mg, 225 mg, Oral, BID, Ivor Costa, MD, 225 mg at 06/26/21 3009   rosuvastatin (CRESTOR) tablet 20 mg, 20 mg, Oral, Daily, Ivor Costa, MD, 20 mg at 06/26/21 0840   senna-docusate (Senokot-S) tablet 1 tablet, 1 tablet, Oral, QHS PRN, Ivor Costa, MD  Patients Current Diet:  Diet Order             DIET DYS 3 Room service appropriate? Yes with Assist; Fluid consistency: Thin  Diet effective now                   Precautions / Restrictions Precautions Precautions: Fall Precaution Comments: L hemi (UE>LE) Restrictions Weight Bearing Restrictions: No   Has the patient had 2 or more falls or a fall with injury in the past year? Yes  Prior Activity Level Limited Community (1-2x/wk): out of house 3x/week; drives  Prior Functional Level Self Care: Did the patient need help bathing, dressing, using the toilet or eating?  Independent  Indoor Mobility: Did the patient need assistance with walking from room to room (with or without device)? Independent  Stairs: Did the patient need assistance with internal or external stairs (with or without device)? Independent  Functional Cognition: Did the patient need help planning regular tasks such as shopping or remembering to take medications? Independent  Home Assistive Devices / Equipment Home Assistive Devices/Equipment: None Home Equipment: Shower seat - built in, Hand held shower head, Kasandra Knudsen - single point  Prior Device Use: Indicate devices/aids used by the patient prior to current illness, exacerbation or injury? None of the above  Current Functional Level Cognition  Overall Cognitive Status: Within Functional Limits for tasks assessed Orientation Level: Oriented X4 General Comments: very motivated!, speech improved    Extremity Assessment (includes Sensation/Coordination)  Upper Extremity Assessment:  (hx of R radial nerve palsy; LUE per OT assessment) LUE Deficits / Details: shoulder flexion 3/5, elbow flex/ext 4-/5, grip 4/5; impaired coordination with testing, sensation intact, function improves with visual attention to task, able to distinguish hot/cold LUE Sensation: WNL LUE Coordination: decreased fine motor, decreased gross motor  Lower Extremity Assessment: LLE deficits/detail (RLE WFL) LLE Deficits / Details: 2-/5 hip flexion in sitting, 2+/5 ankle dorsiflexion, 3-/5 knee extension in sitting, LLE heel to shin slowed movement LLE Coordination: decreased fine motor, decreased gross motor    ADLs  Overall ADL's : Needs assistance/impaired Eating/Feeding: Sitting, Cueing for safety Eating/Feeding Details (indicate cue type and reason): cues for swallowing precautions provided by SLP (on paper in room); with encouragement, pt attempts to open packets using L hand to pinch but when unsuccessful able to transfer packet to L hand to stabilize and use R  hand to rip open packet, able to use L hand to stabilize cups/containers with minimal instability. Grooming: Wash/dry hands, Standing, Min guard Grooming Details (indicate cue type and reason): CGA, cues for LUE WBing on counter for stability, dynamic reaching outside BOS L and R, alternating BUE Upper Body Bathing: Standing, Minimal assistance Upper Body Bathing Details (indicate cue type and reason): Pt stood at sink, cue for LUE WBing through elbow on counter for stability, L knee blocked, Min A to complete hair  washing under sink faucet, using dominany RUE Lower Body Dressing: Sitting/lateral leans, Minimal assistance Lower Body Dressing Details (indicate cue type and reason): Able to don R sock seated EOB with increased time/effort and LUE attempting to assist in task; required MIN A to initiate donning of L sock after pt attempted and was unable to get over toes, pt able to complete using B hands (LUE with limited strength and coordination to assist much) Toilet Transfer: Ambulation, Min guard, Minimal assistance Toilet Transfer Details (indicate cue type and reason): handheld assist, CGA, slow, tends to reach out for stability with RUE, MIN A to correct for slight LOB Toileting- Clothing Manipulation and Hygiene: Sitting/lateral lean, Minimal assistance Toileting - Clothing Manipulation Details (indicate cue type and reason): able to perform pericare using dominant RUE, in standing and VC to initiate, pt incorporates LUE into task of pulling her hands up, improved functional performance when asked pt to hold onto grab bar with RUE to allow for LUE opportunity to perform, MIN A to complete on R hip Functional mobility during ADLs: Minimal assistance, Cueing for safety General ADL Comments: MIN A 2/2 decreased balance and LLE feeling as though it may give way per pt report    Mobility  Overal bed mobility: Needs Assistance Bed Mobility: Supine to Sit, Sit to Supine Supine to sit: Supervision,  HOB elevated Sit to supine: Min assist, HOB elevated General bed mobility comments: Pt sat back down brought BLE into the bed and swung them around to the other side, requiring to sit up on the other side of the bed to eat lunch, educated in BUE positioning to help support LUE into task but ultimately required MIN A for trunk support to rotate to R side    Transfers  Overall transfer level: Needs assistance Equipment used: None Transfers: Sit to/from Stand Sit to Stand: Min assist, Min guard General transfer comment: repeated STS transfers from EOB to standing at sink    Ambulation / Gait / Stairs / Emergency planning/management officer  Ambulation/Gait Ambulation/Gait assistance: Herbalist (Feet): 50 Feet Assistive device: Rolling walker (2 wheeled) Gait Pattern/deviations: Decreased step length - right, Decreased step length - left, Decreased stride length, Decreased weight shift to left General Gait Details: step to pattern, cued for heel strike on LLE. several standing rest breaks Gait velocity: decreased    Posture / Balance Dynamic Sitting Balance Sitting balance - Comments: poor dynamic sitting balance with slight LOB when attempting to don L sock EOB requiring MIN A to correct; fair static balance Balance Overall balance assessment: Needs assistance Sitting-balance support: No upper extremity supported, Feet supported Sitting balance-Leahy Scale: Good Sitting balance - Comments: poor dynamic sitting balance with slight LOB when attempting to don L sock EOB requiring MIN A to correct; fair static balance Standing balance support: Single extremity supported, During functional activity Standing balance-Leahy Scale: Fair Standing balance comment: requires at least UE on counter or hips leaning against counter for stability with dynamic reaching outside BOS    Special needs/care consideration Diabetic management Lantus: 45 units 2x daily; novoLOG: 0-5 units daily at bedtime; novoLOG:  0-9 units 3x daily at [employer] and Designated visitor [employer], husband   Previous Home Environment (from acute therapy documentation) Living Arrangements: Spouse/significant other  Lives With: Spouse Available Help at Discharge: Family, Available 24 hours/day Type of Home: House Home Layout: One level Home Access: Stairs to enter Entrance Stairs-Rails: Right Entrance Stairs-Number of Steps: 4 Bathroom Shower/Tub: Multimedia programmer: Associate Professor  Accessibility: Yes How Accessible: Accessible via walker Home Care Services: No  Discharge Living Setting Plans for Discharge Living Setting: Patient's home Type of Home at Discharge: House Discharge Home Layout: One level Discharge Home Access: Stairs to enter Entrance Stairs-Rails: Right Entrance Stairs-Number of Steps: 4 Discharge Bathroom Shower/Tub: Walk-in shower Discharge Bathroom Toilet: Standard Discharge Bathroom Accessibility: Yes How Accessible: Accessible via walker Does the patient have any problems obtaining your medications?: No  Social/Family/Support Systems Anticipated Caregiver: Cory Rama, husband Anticipated Caregiver's Contact Information: [phone] Caregiver Availability: 24/7 Discharge Plan Discussed with Primary Caregiver: Yes Is Caregiver In Agreement with Plan?: Yes Does Caregiver/Family have Issues with Lodging/Transportation while Pt is in Rehab?: No  Goals Patient/Family Goal for Rehab: PT/OT/SLP Supervision  Expected length of stay:12-14 days Pt/Family Agrees to Admission and willing to participate: Yes Program Orientation Provided & Reviewed with Pt/Caregiver Including Roles  & Responsibilities: Yes  Decrease burden of Care through IP rehab admission: NA  Possible need for SNF placement upon discharge: Not anticipated  Patient Condition: I have reviewed medical records from [facility], spoken with CM, and patient. I discussed via phone for inpatient  rehabilitation assessment.  Patient will benefit from ongoing PT and OT, can actively participate in 3 hours of therapy a day 5 days of the week, and can make measurable gains during the admission.  Patient will also benefit from the coordinated team approach during an Inpatient Acute Rehabilitation admission.  The patient will receive intensive therapy as well as Rehabilitation physician, nursing, social worker, and care management interventions.  Due to safety, skin/wound care, disease management, medication administration, pain management, and patient education the patient requires 24 hour a day rehabilitation nursing.  The patient is currently min-mod A  with mobility and basic ADLs.  Discharge setting and therapy post discharge at home with home health is anticipated.  Patient has agreed to participate in the Acute Inpatient Rehabilitation Program and will admit today.  Preadmission Screen Completed By:  Bethel Born, 06/26/2021 3:17 PM ______________________________________________________________________   Discussed status with Dr. Ranell Patrick  on 06/28/21 at 1030  and received approval for admission today.  Admission Coordinator:  Bethel Born, [place], time [id number] /Date 06/28/21 with updates by Clemens Catholic, SLP  Assessment/Plan: Diagnosis: CVA Does the need for close, 24 hr/day Medical supervision in concert with the patient's rehab needs make it unreasonable for this patient to be served in a less intensive setting? Yes Co-Morbidities requiring supervision/potential complications: HLD, adjustment disorder with mixed features, major depressive disorder, type 2 DM, CAD Due to bladder management, bowel management, safety, skin/wound care, disease management, medication administration, pain management, and patient education, does the patient require 24 hr/day rehab nursing? Yes Does the patient require coordinated care of a physician, rehab nurse, PT, OT, SLP to address physical and  functional deficits in the context of the above medical diagnosis(es)? Yes Addressing deficits in the following areas: balance, endurance, locomotion, strength, transferring, bowel/bladder control, bathing, dressing, feeding, grooming, toileting, cognition, and psychosocial support, speech, swallow Can the patient actively participate in an intensive therapy program of at least 3 hrs of therapy 5 days a week? Yes The potential for patient to make measurable gains while on inpatient rehab is excellent Anticipated functional outcomes upon discharge from inpatient rehab: supervision PT, supervision OT, supervision SLP Estimated rehab length of stay to reach the above functional goals is: 14-17 days Anticipated discharge destination: Home 10. Overall Rehab/Functional Prognosis: excellent   MD Signature: Leeroy Cha, MD

## 2021-06-26 NOTE — Progress Notes (Signed)
Patient ankles swelling after sitting for a while.  Per family- patient has used TED hose in the past to assist with swelling.    Per MD- ok to apply TED hose.

## 2021-06-26 NOTE — Evaluation (Signed)
Speech Language Pathology Evaluation Patient Details Name: Erika [name] MRN: [id number] DOB: 16-Jun-1971 Today's Date: 06/26/2021 Time: 1330-1500 SLP Time Calculation (min) (ACUTE ONLY): 90 min  Problem List:  Patient Active Problem List   Diagnosis Date Noted   Stroke ([place]) 06/24/2021   Chronic diastolic CHF (congestive heart failure) ([place]) 06/24/2021   Fall 06/24/2021   Abnormal LFTs 06/24/2021   Acute respiratory failure with hypoxia ([place]) 06/24/2021   Olecranon bursitis of left elbow 06/12/2021   Chronic hip pain (Bilateral) 06/09/2021   Chronic use of opiate for therapeutic purpose 05/09/2021   Greater trochanteric bursitis of hip (Left) 03/09/2021   Bursitis of hip (Left) 79/89/2119   Uncomplicated opioid dependence ([place]) 02/20/2021   Acute conjunctivitis of left eye 01/02/2021   Unintentional weight loss 11/21/2020   Broken teeth (Right) 08/02/2020   History of MI (myocardial infarction) (July 2017) 08/02/2020   Neuropathy 06/09/2020   Dental abscess 06/09/2020   Foot drop (Left) 06/09/2020   Carotid stenosis, asymptomatic, right 04/27/2020   Chronic migraine 11/03/2019   Pyelonephritis due to Escherichia coli 10/26/2019   Thrombocytosis 09/16/2019   Erythrocytosis 09/16/2019   Hypercalcemia 09/06/2019   Leukocytosis 09/06/2019   Polycythemia 09/06/2019   Chronic hip pain (Left) 08/27/2019   Osteoarthritis of hip (Left) 08/27/2019   Gluteal tendonitis of buttock (Left) 08/27/2019   Radial nerve palsy (Right) 07/15/2019   Neuropathy of radial nerve (Right) 06/30/2019   Abnormal bruising 06/25/2019   History of carotid endarterectomy (Right) 03/04/2019   Pain medication agreement signed 03/04/2019   Atypical facial pain (Right) 01/27/2019   Chronic ear pain (Right) 01/27/2019   Chronic jaw pain (Right) 01/27/2019   Geniculate Neuralgia (Right) 01/27/2019   Vitamin D deficiency 01/19/2019   Neurogenic pain 01/19/2019   Chronic anticoagulation (PLAVIX)  01/19/2019   Chronic pain syndrome 01/07/2019   Long term current use of opiate analgesic 01/07/2019   Long term prescription benzodiazepine use 01/07/2019   Pharmacologic therapy 01/07/2019   Disorder of skeletal system 01/07/2019   Problems influencing health status 01/07/2019   Chronic headaches (1ry area of Pain) (Right) 01/07/2019   Opiate use 09/24/2018   PVD (peripheral vascular disease) ([place]) 06/24/2018   Chronic ankle pain (Bilateral) 12/27/2017   Tendinopathy of gluteus medius (Right) 12/27/2017   Tendinopathy of gluteus medius (Left) 12/27/2017   Bilateral hip pain 12/26/2017   Chronic elbow pain (Left) 10/17/2017   Elevated troponin I level 05/21/2017   Severe sepsis ([place]) 05/19/2017   Major depressive disorder, recurrent episode, moderate (HCC) 11/19/2016   Insomnia 10/30/2016   Constipation 07/25/2016   S/P CABG x 4 07/06/2016   Bradycardia    CAD (coronary artery disease)    Carotid stenosis    CAD in native artery 06/29/2016   Elevated troponin 06/28/2016   Essential hypertension, malignant 06/28/2016   Tobacco abuse 06/28/2016   Essential hypertension    Malignant hypertension    Type 2 diabetes mellitus with other specified complication (HCC)    Chest pain with high risk for cardiac etiology 06/27/2016   NSTEMI (non-ST elevated myocardial infarction) ([place]) 06/27/2016   Proteinuria 03/15/2016   Renal artery stenosis ([place]) 03/15/2016   MDD (major depressive disorder) 10/17/2015   Agoraphobia with panic attacks 04/25/2015   HTN (hypertension), malignant 10/20/2013   Cluster headache 03/20/2012   HLD (hyperlipidemia) 07/26/2010   ADJUSTMENT DISORDER WITH MIXED FEATURES 07/26/2010   Past Medical History:  Past Medical History:  Diagnosis Date   Arthralgia of temporomandibular joint    CAD,  multiple vessel    a. 06/2016 Cath: ostLM 40%, ostLAD 40%, pLAD 95%, ost-pLCx 60%, pLCx 95%, mLCx 60%, mRCA 95%, D2 50%, LVSF nl;  b. 07/2016 CABG x 4 (LIMA->LAD, VG->Diag,  VG->OM, VG->RCA); c. 08/2016 Cath: 3VD w/ 4/4 patent grafts. LAD distal to LIMA has diff dzs->Med rx; d. 08/2020 Cath: 4/4 patent grafts, native 3VD. EF 55-65%-->Med Rx.   Carotid arterial disease ([place])    a. 07/2016 s/p R CEA; b. 02/2021 U/S: RICA 16-10%, LICA 9-60%.   Clotting disorder ([place])    Depression    Diastolic dysfunction    a. 06/2016 Echo: EF 50-55%, mild inf wall HK, GR1DD, mild MR, RV sys fxn nl, mildly dilated LA, PASP nl   Fatty liver disease, nonalcoholic 4540   History of blood transfusion    with heart surgery   HLD (hyperlipidemia)    Labile hypertension    a. prior renal ngiogram negative for RAS in 03/2016; b. catecholamines and metanephrines normal, mildly elevated renin with normal aldosterone and normal ratio in 02/2016   Myocardial infarction [facility]) 2017   Obesity    PAD (peripheral artery disease) ([place])    a. 09/2018 s/p L SFA stenting; b. 07/2019 Periph Angio: Patent m/d L SFA stent w/ 100% L SFA distal to stent. L AT 100d, L Peroneal diff dzs-->Med Rx; c. 02/2021 ABIs: stable @ 0.61 on R and 0.46 on L.   PTSD (post-traumatic stress disorder)    Tobacco abuse    Type 2 diabetes mellitus ([place]) 12/2015   Past Surgical History:  Past Surgical History:  Procedure Laterality Date   ABDOMINAL AORTOGRAM W/LOWER EXTREMITY [name] 10/15/2018   Procedure: ABDOMINAL AORTOGRAM W/LOWER EXTREMITY;  Surgeon: Wellington Hampshire, MD;  Location: [place] [facility];  Service: Cardiovascular;  Laterality: [name];   ABDOMINAL AORTOGRAM W/LOWER EXTREMITY Bilateral 08/19/2019   Procedure: ABDOMINAL AORTOGRAM W/LOWER EXTREMITY;  Surgeon: Wellington Hampshire, MD;  Location: [place] [facility];  Service: Cardiovascular;  Laterality: Bilateral;   CARDIAC CATHETERIZATION [name] 06/29/2016   Procedure: Left Heart Cath and Coronary Angiography;  Surgeon: Minna Merritts, MD;  Location: [place] [facility];  Service: Cardiovascular;  Laterality: [name];   CARDIAC CATHETERIZATION [name] 08/29/2016   Procedure: Left Heart  Cath and Cors/Grafts Angiography;  Surgeon: Wellington Hampshire, MD;  Location: [place] [facility];  Service: Cardiovascular;  Laterality: [name];   CESAREAN SECTION     CHOLECYSTECTOMY     CORONARY ARTERY BYPASS GRAFT [name] 07/06/2016   Procedure: CORONARY ARTERY BYPASS GRAFTING (CABG) x four, using left internal mammary artery and right leg greater saphenous vein harvested endoscopically;  Surgeon: Ivin Poot, MD;  Location: [place];  Service: Open Heart Surgery;  Laterality: [name];   ENDARTERECTOMY Right 07/06/2016   Procedure: ENDARTERECTOMY CAROTID;  Surgeon: Rosetta Posner, MD;  Location: [facility] OR;  Service: Vascular;  Laterality: Right;   ENDARTERECTOMY Right 04/27/2020   Procedure: REDO OF RIGHT ENDARTERECTOMY CAROTID;  Surgeon: Rosetta Posner, MD;  Location: [facility] OR;  Service: Vascular;  Laterality: Right;   LEFT HEART CATH AND CORS/GRAFTS ANGIOGRAPHY [name] 08/24/2020   Procedure: LEFT HEART CATH AND CORS/GRAFTS ANGIOGRAPHY;  Surgeon: Wellington Hampshire, MD;  Location: [place] [facility];  Service: Cardiovascular;  Laterality: [name];   PERIPHERAL VASCULAR CATHETERIZATION [name] 04/18/2016   Procedure: Renal Angiography;  Surgeon: Wellington Hampshire, MD;  Location: [place] [facility];  Service: Cardiovascular;  Laterality: [name];   PERIPHERAL VASCULAR INTERVENTION Left 10/15/2018   Procedure: PERIPHERAL  VASCULAR INTERVENTION;  Surgeon: Wellington Hampshire, MD;  Location: [place] [facility];  Service: Cardiovascular;  Laterality: Left;  Left superficial femoral   TEE WITHOUT CARDIOVERSION [name] 07/06/2016   Procedure: TRANSESOPHAGEAL ECHOCARDIOGRAM (TEE);  Surgeon: Ivin Poot, MD;  Location: [place];  Service: Open Heart Surgery;  Laterality: [name];   TONSILLECTOMY     HPI:  Pt is a 50 y.o. female with medical history significant of HTN, HLD, DM, tobacco use/abuse, PAD, CAD, CABG, dCHF, foot drop, renal artery stenosis, carotid artery stenosis (s/p of right CEA 2017), PTSD, Adjustment Disorder, who presents with slurred  speech, left facial droop, left arm weakness.   Pt states that she was walking to the bathroom in the middle night, and her legs gave away and fell.  No loss of consciousness.  No significant injury.  After that, patient had developed slurred speech, left facial droop and left arm weakness.  Patient has chronic left foot drop, no leg weakness or numbness.  Patient has some mild dry cough, denies chest pain or shortness of breath.  Patient was found to have oxygen desaturation to 89% by EMS and started 5 L oxygen with oxygen saturation 95%.   HEAD CT: "Abnormal cortical and subcortical white matter hypodensity in  both the superior right perirolandic cortex (series 3, image 23) and  lateral right occipital pole (image 17) are new since 2020, but  appears subacute versus chronic. Possible petechial hemorrhage  associated with the latter.     No superimposed acute cortically based infarct identified. Elsewhere  gray-white matter differentiation is within normal limits.     No midline shift, ventriculomegaly, mass effect, evidence of mass  lesion, or other intracranial hemorrhage.".  MRI pending today.  CXR - basilar atelectasis.  Cardiology following now.   Assessment / Plan / Recommendation Clinical Impression  Pt appears to present w/ Dysarthria impacting her verbal communication at the word-conversation levels. When pt utilized strategies of slowing rate of speech, emphasizing speech sounds, and increasing volume w/ supporting words w/ full breath support, pt's Dysarthria lessened and articulation of speech improved. Pt often spoke quickly, min rushed speech. She was wearing her full Dentures this session which greatly improved precision of speech sounds vs BSE. Education and practice of articulation strategies was completed w/ both pt and Husband who was present -- focused on over-articulation w/ Big, Loud speech; identified need to replenish breath support for the sentence/task for good articulation and  clarity. Pt tended to speak more quietly and rushed but noted when she emphasized words w/ increased volume and effort, intelligibility greatly improved. Pt presented w/ Left lingual and labial oral motor weakness during tasks of lingual protrusion and lateral reach/strength. Decreased Left labial tone and ROM noted(min) but much improved since BSE. Noted improved oral motor strength/ROM w/ increased effort. No apparent expressive or receptive aphasia noted, but more formal assessment Cognitive-linguistic assessment is recommended d/t inconsistencies noted x2 today: she repeated description of planned task w/ Husband w/in just a few minutes; was unable to correctly state the year -- other Cognitive questions were accurate though min Distraction and inability to Recall 3 Dysarthria strategies was noted during this informal assessment. Pt may have been hampered by a headache somewhat.   Discussed Dysarthria strategies and exercises w/ pt/Husband; practice together and gave Handouts. Recommend pt f/u w/ further skilled ST servcies to continue working on the Dysarthria through exercises and strategies to improve verbal communication in ADLs, and to f/u w/ more formal Cognitive-linguistic assessment  to address any needs in ADLs. CM/NSG/MD updated. Pt and Husband agreed.    Dysphagia Treatment Session:  Pt seen for ongoing assessment of swallowing. She appears much improved from BSE; verbally responsive, Dysarthria improved. Pt is wearing Full Dentures today. She is supported w/ O2 mask d/t "cluster headache" currently but taking mask off intermittently to engage w/ SLP.  Pt agreed verbally to the need for following them especially sitting upright for all oral intake and taking Small, Single sips of thin liquids via Cup. Pt assisted w/ positioning d/t weakness then given trials of thin liquids iva Cup w/ no overt clinical s/s of aspiration noted. No coughing, vocal quality clear b/t trials, respiratory effort  remained calm. Pt verbalized she felt she was doing "well" and that she was "careful". NO straws were utiilized for better oral control. Pt explained she had been doing well w/ foods at meals -- some meats were more difficult to chew(pot roast, and broccoli). Oral phase appeared grossly [facility] for bolus management and timely A-P transfer for swallowing of liquids; suspect slower oral motor movements from the lingual weakness may be impacting coordination of bolus management/mastication. Pt stated she wanted to "try" more solid foods(maybe the mech soft, dys. 3 level) -- discussed mastication and oral clearing of consistencies using lingual sweeping and f/u swallow. Instructed pt to spit out what she cannot chew/swallow safely.  Recommend trial upgrade to Dysphagia level 3 diet (mech soft) w/ chopped meats, gravies added to moisten foods. Continue Thin liquids VIA CUP ONLY. Recommend general aspiration precautions; Pills Whole vs Crushed in Puree; tray setup and positioning assistance for meals d/t LUE weakness. ST services will continue to f/u w/ pt for toleration of diet and education as needed while admitted. NSG updated. Precautions posted at bedside. Pt agreed.     SLP Assessment  SLP Recommendation/Assessment: All further Speech Lanaguage Pathology  needs can be addressed in the next venue of care SLP Visit Diagnosis: Dysarthria and anarthria (R47.1);Cognitive communication deficit (R41.841);Dysphagia, oropharyngeal phase (R13.12)    Follow Up Recommendations  Inpatient Rehab    Frequency and Duration min 3x week  2 weeks      SLP Evaluation Cognition  Overall Cognitive Status: Impaired/Different from baseline Arousal/Alertness: Awake/alert Orientation Level: Oriented to person;Oriented to place;Oriented to situation (mistake w/ year but oriented to month/holiday) Attention: Focused;Sustained Focused Attention:  (min distracted) Sustained Attention:  (yes, but min distracted) Memory:  Impaired (regarding recall of the 3 key words of dysarthria strategies) Awareness: Appears intact Problem Solving: Appears intact (general) Executive Function: Organizing (able to self-correct her errors during tx tasks) Organizing: Impaired Organizing Impairment: Verbal complex;Functional complex Behaviors: Restless (min) Safety/Judgment: Appears intact (general)       Comprehension  Auditory Comprehension Overall Auditory Comprehension: Other (comment) (requires further formal assessment) Reading Comprehension Reading Status: Within funtional limits    Expression Expression Primary Mode of Expression: Verbal Verbal Expression Overall Verbal Expression:  (requires further formal assessment) Non-Verbal Means of Communication: Not applicable Written Expression Dominant Hand: Right Written Expression: Not tested   Oral / Motor  Oral Motor/Sensory Function Overall Oral Motor/Sensory Function: Mild impairment (-Moderate) Facial ROM: Reduced left;Suspected CN VII (facial) dysfunction Facial Symmetry: Abnormal symmetry left;Suspected CN VII (facial) dysfunction Facial Strength: Reduced left;Suspected CN VII (facial) dysfunction Facial Sensation: Within Functional Limits Lingual ROM: Reduced left;Suspected CN XII (hypoglossal) dysfunction Lingual Symmetry: Abnormal symmetry left;Suspected CN XII (hypoglossal) dysfunction Lingual Strength: Reduced Lingual Sensation: Reduced (reduced BOT and gag sensation on L side) Velum: Within Functional  Limits (grossly) Mandible: Within Functional Limits Motor Speech Overall Motor Speech: Impaired Respiration: Within functional limits Phonation: Normal Resonance: Within functional limits Articulation: Impaired Level of Impairment: Word Intelligibility: Intelligibility reduced Word: 75-100% accurate Phrase: 75-100% accurate Sentence: 75-100% accurate Conversation: 75-100% accurate Effective Techniques: Slow rate;Increased vocal  intensity;Over-articulate;Pause   GO                      Orinda Kenner, [place], CCC-SLP Speech Language Pathologist Rehab Services [phone] [facility] 06/26/2021, 5:35 PM

## 2021-06-26 NOTE — Progress Notes (Signed)
Progress Note  Patient Name: Erika [name] Date of Encounter: 06/26/2021  Primary Cardiologist: [name], MD  Subjective   Strength and speech improving.  Sitting up and eating this AM w/ family @ bedside.  BPs variable, higher, over past 24 hrs.  Inpatient Medications    Scheduled Meds:  aspirin  81 mg Oral Daily   benztropine  0.5 mg Oral QHS   chlorproMAZINE  100 mg Oral QHS   clopidogrel  75 mg Oral Daily   enoxaparin (LOVENOX) injection  40 mg Subcutaneous Q24H   FLUoxetine  60 mg Oral Daily   insulin aspart  0-5 Units Subcutaneous QHS   insulin aspart  0-9 Units Subcutaneous TID WC   insulin glargine  45 Units Subcutaneous BID   isosorbide mononitrate  30-60 mg Oral BID   lamoTRIgine  200 mg Oral QHS   nicotine  21 mg Transdermal Daily   pregabalin  225 mg Oral BID   rosuvastatin  20 mg Oral Daily   Continuous Infusions:  ampicillin-sulbactam (UNASYN) IV 3 g (06/25/21 2130)   PRN Meds: albuterol, clonazePAM, hydrALAZINE, nitroGLYCERIN, ondansetron (ZOFRAN) IV, oxyCODONE, senna-docusate   Vital Signs    Vitals:   06/25/21 1750 06/25/21 2005 06/26/21 0342 06/26/21 0725  BP: (!) 182/93 (!) 194/99 (!) 164/76 95/81  Pulse: 89 96 91 75  Resp: 19 18    Temp: 98.5 F (36.9 C) 98.7 F (37.1 C) 98.6 F (37 C) 98.2 F (36.8 C)  TempSrc: Oral   Oral  SpO2: 93% 94% 92% 95%  Weight:      Height:        Intake/Output Summary (Last 24 hours) at 06/26/2021 1007 Last data filed at 06/26/2021 0400 Gross per 24 hour  Intake 2427.32 ml  Output 750 ml  Net 1677.32 ml   Filed Weights   06/24/21 0550  Weight: 84.4 kg    Physical Exam   GEN: Well nourished, well developed, in no acute distress.  HEENT: L facial droop. Neck: Supple, no JVD, carotid bruits, or masses. Cardiac: RRR, no murmurs, rubs, or gallops. No clubbing, cyanosis, edema.  Radials 2+, diminished distal lower extremity pulses bilaterally. Respiratory:  Respirations regular and unlabored,  clear to auscultation bilaterally. GI: Soft, nontender, nondistended, BS + x 4. MS: no deformity or atrophy. Skin: warm and dry, no rash. Neuro:  S left facial droop, left-sided weakness.  Speech improving.  Psych: AAOx3.  Normal affect.  Labs    Chemistry Recent Labs  Lab 06/24/21 0540 06/25/21 0740 06/26/21 0616  NA 135  --  137  K 4.0  --  3.7  CL 98  --  103  CO2 25  --  27  GLUCOSE 306*  --  153*  BUN 15  --  12  CREATININE 0.80  --  0.57  CALCIUM 9.9  --  9.4  PROT 7.2 6.7 6.4*  ALBUMIN 3.8 3.6 3.4*  AST 63* 52* 32  ALT 68* 59* 47*  ALKPHOS 85 74 72  BILITOT 0.8 0.6 0.5  GFRNONAA >60  --  >60  ANIONGAP 12  --  7     Hematology Recent Labs  Lab 06/24/21 0540 06/26/21 0616  WBC 18.6* 11.5*  RBC 5.07 4.24  HGB 15.4* 12.7  HCT 45.2 37.6  MCV 89.2 88.7  MCH 30.4 30.0  MCHC 34.1 33.8  RDW 13.9 13.8  PLT 326 322    Cardiac Enzymes  Recent Labs  Lab 06/24/21 0552 06/24/21 0812 06/24/21 1305  06/25/21 1111  TROPONINIHS 106* 172* 250* 150*      BNP Recent Labs  Lab 06/24/21 0540  BNP 33.3    Lipids  Lab Results  Component Value Date   CHOL 202 (H) 06/25/2021   HDL 29 (L) 06/25/2021   LDLCALC UNABLE TO CALCULATE IF TRIGLYCERIDE OVER 400 mg/dL 06/25/2021   LDLDIRECT 99.6 (H) 06/25/2021   TRIG 636 (H) 06/25/2021   CHOLHDL 7.0 06/25/2021    HbA1c  Lab Results  Component Value Date   HGBA1C 11.5 (A) 03/14/2021    Radiology    CT Angio Chest Pulmonary Embolism (PE) W or WO Contrast  Result Date: 06/24/2021 CLINICAL DATA:  Evaluate for pulmonary embolism. History of code stroke. EXAM: CT ANGIOGRAPHY CHEST WITH CONTRAST TECHNIQUE: Multidetector CT imaging of the chest was performed using the standard protocol during bolus administration of intravenous contrast. Multiplanar CT image reconstructions and MIPs were obtained to evaluate the vascular anatomy. CONTRAST:  32mL OMNIPAQUE IOHEXOL 350 MG/ML SOLN COMPARISON:  Chest CT 06/28/2016 FINDINGS:  Cardiovascular: Suboptimal evaluation of the pulmonary arteries. Large amount of contrast is already in the aorta. Bilateral main pulmonary arteries are patent but cannot evaluate beyond the main pulmonary arteries. Atherosclerotic disease in the thoracic aorta, post CABG. Calcifications in the native coronary arteries. Great vessels are patent. Atherosclerotic plaque involving the right common carotid artery and innominate artery. Normal caliber of the thoracic aorta. Mediastinum/Nodes: Postsurgical changes from median sternotomy and CABG. No significant lymph node enlargement. Lungs/Pleura: Trachea and mainstem bronchi are patent. No large pleural effusions. Patchy densities in the right upper lobe. 5 mm nodule in the superior segment of the right lower lobe has minimally changed since 2017 and likely benign. zPeripheral densities in the posterior right lower lobe are most compatible with atelectasis. Peripheral nodule in the posterior left lower lobe on sequence 6 image 49 appears stable. Upper Abdomen: Diffuse low density of the liver is compatible with steatosis. Musculoskeletal: No acute bone abnormality. Review of the MIP images confirms the above findings. IMPRESSION: 1. Suboptimal evaluation for pulmonary embolism. Pulmonary emboli cannot be confidently evaluated on this examination. 2. Patchy densities in the right upper lobe. Findings are nonspecific and could be related to areas of atelectasis versus mild infection. 3. Nodule in the superior segment of the right lower lobe has minimally changed since 2017 and likely benign. 4. Hepatic steatosis. 5.  Aortic Atherosclerosis (ICD10-I70.0). Electronically Signed   By: Markus Daft M.D.   On: 06/24/2021 10:55   MR BRAIN WO CONTRAST  Result Date: 06/24/2021 CLINICAL DATA:  Slurred speech, left facial droop, left arm weakness, code stroke follow-up EXAM: MRI HEAD WITHOUT CONTRAST TECHNIQUE: Multiplanar, multiecho pulse sequences of the brain and surrounding  structures were obtained without intravenous contrast. COMPARISON:  None. FINDINGS: Brain: There is reduced diffusion in the parasagittal right pons. The right parietal and occipital infarcts identified on prior CT imaging are chronic. There is associated T1 shortening reflecting laminar necrosis. This accounts for the hyperdensity seen on CT rather than hemorrhage. There is no intracranial mass or mass effect. Additional patchy foci of T2 hyperintensity in the supratentorial and pontine white matter are nonspecific but may reflect mild chronic microvascular ischemic changes. There are a few scattered foci of susceptibility hypointensity in the cerebral white matter, left basal ganglia, and right thalamus compatible with chronic microhemorrhages. There is no hydrocephalus or extra-axial fluid collection. Vascular: Major vessel flow voids at the skull base are preserved. Skull and upper cervical spine: Normal marrow signal  is preserved. Sinuses/Orbits: Paranasal sinuses are aerated. Orbits are unremarkable. Other: Sella is unremarkable.  Mastoid air cells are clear. IMPRESSION: Acute right pontine perforator infarct. Right parietal and occipital infarcts are chronic. Mild chronic microvascular ischemic changes. Mild burden of scattered chronic microhemorrhages. Electronically Signed   By: Macy Mis M.D.   On: 06/24/2021 14:56   CT CEREBRAL PERFUSION W CONTRAST  Result Date: 06/24/2021 CLINICAL DATA:  Slurred speech, left facial droop, left arm weakness, history of right CEA 2017 EXAM: CT ANGIOGRAPHY HEAD AND NECK CT PERFUSION BRAIN TECHNIQUE: Multidetector CT imaging of the head and neck was performed using the standard protocol during bolus administration of intravenous contrast. Multiplanar CT image reconstructions and MIPs were obtained to evaluate the vascular anatomy. Carotid stenosis measurements (when applicable) are obtained utilizing NASCET criteria, using the distal internal carotid diameter as the  denominator. Multiphase CT imaging of the brain was performed following IV bolus contrast injection. Subsequent parametric perfusion maps were calculated using RAPID software. CONTRAST:  123mL OMNIPAQUE IOHEXOL 350 MG/ML SOLN COMPARISON:  CT head earlier same day, CTA 04/08/2020 FINDINGS: CT HEAD FINDINGS Brain: Stable appearance of areas of hypoattenuation and loss of gray-white differentiation in the right parietal lobe and right occipital lobe. As before, there is possible petechial hemorrhage associated with the occipital infarct. No new hemorrhage or mass effect. Ventricles are stable in size. Vascular: No new findings. Skull: No new findings. Sinuses/Orbits: No new findings. Other: None. CTA NECK FINDINGS Aortic arch: Stable appearance of plaque at the great vessel origins. There is direct origin of the left vertebral artery from the arch. Right carotid system: Circumferential noncalcified plaque along the common carotid is decreased. Decreased plaque at the bifurcation and proximal ICA with improved stenosis post endarterectomy. Left carotid system: Patent. Mixed plaque along the common carotid, bifurcation, and proximal internal carotid. Unchanged 50% stenosis of the proximal ICA. Vertebral arteries: Right vertebral artery is patent. There is unchanged severe stenosis at the origin of the left vertebral with minimal opacification of the P2 segment. Reconstitution is present at the C2-C3 level. Skeleton: Degenerative changes of the cervical spine. Other neck: No acute abnormality. Upper chest: No apical lung mass. Review of the MIP images confirms the above findings CTA HEAD FINDINGS Anterior circulation: Intracranial internal carotid arteries are patent with mixed plaque causing moderate stenosis. Anterior and middle cerebral arteries are patent. Posterior circulation: Intracranial vertebral arteries are patent with diffuse atherosclerotic irregularity. Left vertebral artery is no longer well seen beyond  PICA origin. Basilar artery is patent. Posterior cerebral arteries are patent. Bilateral posterior communicating arteries are present. Venous sinuses: As permitted by contrast timing, patent. Review of the MIP images confirms the above findings CT Brain Perfusion Findings: CBF (<30%) Volume: 31mL Perfusion (Tmax>6.0s) volume: 9mL Mismatch Volume: 107mL Infarction Location: None. IMPRESSION: Stable appearance of the brain.  No new intracranial hemorrhage. Perfusion imaging demonstrates no evidence of core infarction or penumbra. Less stringent criteria demonstrates elevation of T-max corresponding to right occipital infarct, which is probably subacute with petechial hemorrhage. The right parietal infarct is more likely subacute to chronic. Decreased stenosis and plaque of the distal right common carotid and proximal right internal carotid reflecting interval endarterectomy. Intracranial left vertebral artery is now no longer well seen beyond the PICA origin. Remains high-grade stenosis of the left vertebral origin with reconstitution distally in the neck. Remain significant atherosclerotic irregularity of the proximal left intracranial portion with high-grade stenosis likely accounting for loss of distal flow. Electronically Signed   By:  Macy Mis M.D.   On: 06/24/2021 13:06   DG Chest Port 1 View  Result Date: 06/24/2021 CLINICAL DATA:  Code stroke. EXAM: PORTABLE CHEST 1 VIEW COMPARISON:  May 25 21 FINDINGS: Is 631 hours. Low lung volumes. Basilar atelectasis without focal consolidation or overt pulmonary edema. Interstitial markings are diffusely coarsened with chronic features. The cardiopericardial silhouette is within normal limits for size. Status post CABG. Telemetry leads overlie the chest. IMPRESSION: Low volume chest with basilar atelectasis. Electronically Signed   By: Misty Stanley M.D.   On: 06/24/2021 07:28    CT HEAD CODE STROKE WO CONTRAST  Addendum Date: 06/24/2021   ADDENDUM REPORT:  06/24/2021 06:27 ADDENDUM: Study discussed by telephone with Dr. Luvenia Starch SUNG on 06/24/2021 at 0619 hours. Electronically Signed   By: Genevie Ann M.D.   On: 06/24/2021 06:27   Result Date: 06/24/2021 CLINICAL DATA:  Code stroke. 50 year old female with left side weakness. Last known well midnight. History of right carotid endarterectomy. EXAM: CT HEAD WITHOUT CONTRAST TECHNIQUE: Contiguous axial images were obtained from the base of the skull through the vertex without intravenous contrast. COMPARISON:  Brain MRI 12/03/2012.  Head CT 02/25/2019. FINDINGS: Brain: Abnormal cortical and subcortical white matter hypodensity in both the superior right perirolandic cortex (series 3, image 23) and lateral right occipital pole (image 17) are new since 2020, but appears subacute versus chronic. Possible petechial hemorrhage associated with the latter. No superimposed acute cortically based infarct identified. Elsewhere gray-white matter differentiation is within normal limits. No midline shift, ventriculomegaly, mass effect, evidence of mass lesion, or other intracranial hemorrhage. Vascular: Calcified atherosclerosis at the skull base. No suspicious intracranial vascular hyperdensity. Skull: Negative. Sinuses/Orbits: Visualized paranasal sinuses and mastoids are clear. Other: Visualized orbits and scalp soft tissues are within normal limits. ASPECTS [facility] Stroke Program Early CT Score) Total score (0-10 with 10 being normal): 10 (suspected subacute or chronic cortical infarcts in the right hemisphere). IMPRESSION: 1. Subacute versus chronic appearing cortical infarcts in the right perirolandic and lateral right occipital regions are new since 2020. 2. No superimposed acute cortically based infarct or acute intracranial hemorrhage identified. ASPECTS 10. Electronically Signed: By: Genevie Ann M.D. On: 06/24/2021 06:07   CT ANGIO HEAD CODE STROKE  Result Date: 06/24/2021 CLINICAL DATA:  Slurred speech, left facial droop, left arm  weakness, history of right CEA 2017 EXAM: CT ANGIOGRAPHY HEAD AND NECK CT PERFUSION BRAIN TECHNIQUE: Multidetector CT imaging of the head and neck was performed using the standard protocol during bolus administration of intravenous contrast. Multiplanar CT image reconstructions and MIPs were obtained to evaluate the vascular anatomy. Carotid stenosis measurements (when applicable) are obtained utilizing NASCET criteria, using the distal internal carotid diameter as the denominator. Multiphase CT imaging of the brain was performed following IV bolus contrast injection. Subsequent parametric perfusion maps were calculated using RAPID software. CONTRAST:  15mL OMNIPAQUE IOHEXOL 350 MG/ML SOLN COMPARISON:  CT head earlier same day, CTA 04/08/2020 FINDINGS: CT HEAD FINDINGS Brain: Stable appearance of areas of hypoattenuation and loss of gray-white differentiation in the right parietal lobe and right occipital lobe. As before, there is possible petechial hemorrhage associated with the occipital infarct. No new hemorrhage or mass effect. Ventricles are stable in size. Vascular: No new findings. Skull: No new findings. Sinuses/Orbits: No new findings. Other: None. CTA NECK FINDINGS Aortic arch: Stable appearance of plaque at the great vessel origins. There is direct origin of the left vertebral artery from the arch. Right carotid system: Circumferential noncalcified plaque  along the common carotid is decreased. Decreased plaque at the bifurcation and proximal ICA with improved stenosis post endarterectomy. Left carotid system: Patent. Mixed plaque along the common carotid, bifurcation, and proximal internal carotid. Unchanged 50% stenosis of the proximal ICA. Vertebral arteries: Right vertebral artery is patent. There is unchanged severe stenosis at the origin of the left vertebral with minimal opacification of the P2 segment. Reconstitution is present at the C2-C3 level. Skeleton: Degenerative changes of the cervical  spine. Other neck: No acute abnormality. Upper chest: No apical lung mass. Review of the MIP images confirms the above findings CTA HEAD FINDINGS Anterior circulation: Intracranial internal carotid arteries are patent with mixed plaque causing moderate stenosis. Anterior and middle cerebral arteries are patent. Posterior circulation: Intracranial vertebral arteries are patent with diffuse atherosclerotic irregularity. Left vertebral artery is no longer well seen beyond PICA origin. Basilar artery is patent. Posterior cerebral arteries are patent. Bilateral posterior communicating arteries are present. Venous sinuses: As permitted by contrast timing, patent. Review of the MIP images confirms the above findings CT Brain Perfusion Findings: CBF (<30%) Volume: 99mL Perfusion (Tmax>6.0s) volume: 43mL Mismatch Volume: 38mL Infarction Location: None. IMPRESSION: Stable appearance of the brain.  No new intracranial hemorrhage. Perfusion imaging demonstrates no evidence of core infarction or penumbra. Less stringent criteria demonstrates elevation of T-max corresponding to right occipital infarct, which is probably subacute with petechial hemorrhage. The right parietal infarct is more likely subacute to chronic. Decreased stenosis and plaque of the distal right common carotid and proximal right internal carotid reflecting interval endarterectomy. Intracranial left vertebral artery is now no longer well seen beyond the PICA origin. Remains high-grade stenosis of the left vertebral origin with reconstitution distally in the neck. Remain significant atherosclerotic irregularity of the proximal left intracranial portion with high-grade stenosis likely accounting for loss of distal flow. Electronically Signed   By: Macy Mis M.D.   On: 06/24/2021 13:06   CT ANGIO NECK CODE STROKE  Result Date: 06/24/2021 CLINICAL DATA:  Slurred speech, left facial droop, left arm weakness, history of right CEA 2017 EXAM: CT ANGIOGRAPHY HEAD  AND NECK CT PERFUSION BRAIN TECHNIQUE: Multidetector CT imaging of the head and neck was performed using the standard protocol during bolus administration of intravenous contrast. Multiplanar CT image reconstructions and MIPs were obtained to evaluate the vascular anatomy. Carotid stenosis measurements (when applicable) are obtained utilizing NASCET criteria, using the distal internal carotid diameter as the denominator. Multiphase CT imaging of the brain was performed following IV bolus contrast injection. Subsequent parametric perfusion maps were calculated using RAPID software. CONTRAST:  170mL OMNIPAQUE IOHEXOL 350 MG/ML SOLN COMPARISON:  CT head earlier same day, CTA 04/08/2020 FINDINGS: CT HEAD FINDINGS Brain: Stable appearance of areas of hypoattenuation and loss of gray-white differentiation in the right parietal lobe and right occipital lobe. As before, there is possible petechial hemorrhage associated with the occipital infarct. No new hemorrhage or mass effect. Ventricles are stable in size. Vascular: No new findings. Skull: No new findings. Sinuses/Orbits: No new findings. Other: None. CTA NECK FINDINGS Aortic arch: Stable appearance of plaque at the great vessel origins. There is direct origin of the left vertebral artery from the arch. Right carotid system: Circumferential noncalcified plaque along the common carotid is decreased. Decreased plaque at the bifurcation and proximal ICA with improved stenosis post endarterectomy. Left carotid system: Patent. Mixed plaque along the common carotid, bifurcation, and proximal internal carotid. Unchanged 50% stenosis of the proximal ICA. Vertebral arteries: Right vertebral artery is patent. There  is unchanged severe stenosis at the origin of the left vertebral with minimal opacification of the P2 segment. Reconstitution is present at the C2-C3 level. Skeleton: Degenerative changes of the cervical spine. Other neck: No acute abnormality. Upper chest: No apical  lung mass. Review of the MIP images confirms the above findings CTA HEAD FINDINGS Anterior circulation: Intracranial internal carotid arteries are patent with mixed plaque causing moderate stenosis. Anterior and middle cerebral arteries are patent. Posterior circulation: Intracranial vertebral arteries are patent with diffuse atherosclerotic irregularity. Left vertebral artery is no longer well seen beyond PICA origin. Basilar artery is patent. Posterior cerebral arteries are patent. Bilateral posterior communicating arteries are present. Venous sinuses: As permitted by contrast timing, patent. Review of the MIP images confirms the above findings CT Brain Perfusion Findings: CBF (<30%) Volume: 95mL Perfusion (Tmax>6.0s) volume: 41mL Mismatch Volume: 55mL Infarction Location: None. IMPRESSION: Stable appearance of the brain.  No new intracranial hemorrhage. Perfusion imaging demonstrates no evidence of core infarction or penumbra. Less stringent criteria demonstrates elevation of T-max corresponding to right occipital infarct, which is probably subacute with petechial hemorrhage. The right parietal infarct is more likely subacute to chronic. Decreased stenosis and plaque of the distal right common carotid and proximal right internal carotid reflecting interval endarterectomy. Intracranial left vertebral artery is now no longer well seen beyond the PICA origin. Remains high-grade stenosis of the left vertebral origin with reconstitution distally in the neck. Remain significant atherosclerotic irregularity of the proximal left intracranial portion with high-grade stenosis likely accounting for loss of distal flow. Electronically Signed   By: Macy Mis M.D.   On: 06/24/2021 13:06    Telemetry    RSR - Personally Reviewed  Cardiac Studies   a. 06/2016 Cath: ostLM 40%, ostLAD 40%, pLAD 95%, ost-pLCx 60%, pLCx 95%, mLCx 60%, mRCA 95%, D2 50%, LVSF nl b. 07/2016 CABG x 4 (LIMA->LAD, VG->Diag, VG->OM, VG->RCA) c.  08/2016 Cath: 3VD w/ 4/4 patent grafts. LAD distal to LIMA has diff dzs->Med rx d. 08/2020 Cath: 4/4 patent grafts, native 3VD. EF 55-65%-->Med Rx. _____________  2D Echocardiogram 7.3.2022   1. Left ventricular ejection fraction, by estimation, is 60 to 65%. The  left ventricle has normal function. The left ventricle has no regional  wall motion abnormalities. Left ventricular diastolic parameters were  normal.   2. Right ventricular systolic function is normal. The right ventricular  size is normal. Tricuspid regurgitation signal is inadequate for assessing  PA pressure.   3. The mitral valve is normal in structure. No evidence of mitral valve  regurgitation. No evidence of mitral stenosis.   4. The aortic valve is normal in structure. Aortic valve regurgitation is  not visualized. No aortic stenosis is present.   5. The inferior vena cava is normal in size with greater than 50%  respiratory variability, suggesting right atrial pressure of 3 mmHg.   Patient Profile     Erika [name] is a 50 y.o. female with a history of CAD status post CABG, carotid arterial disease status post right carotid endarterectomy, peripheral arterial disease status post prior left SFA stenting with subsequent to distal SFA occlusion, HFpEF, hypertension, hyperlipidemia, obesity, diabetes, and tobacco abuse, who presented 7/2 w/ L sided wkns and stroke, and was noted to have HsTrop elevation (106  172  250  150).  Assessment & Plan    1.  Acute right hemispheric stroke: Symptoms of left-sided weakness, left facial droop, and garbled speech developing just after midnight on July 2.  Brain  MRI with acute right pontine perforator infarct and chronic right parietal and occipital infarcts.  No large vessel occlusions on CTA head/neck.  Echo with normal LV function and without evidence for ASD/PFO.  No arrhythmias on telemetry.  Neuro has signed off.  Patient awaiting transfer to inpatient rehab at [facility].  2.   CAD/elevated high-sensitivity troponin: Patient with history of severe native multivessel CAD status post CABG x4 in 2017 with 4 4 patent grafts on cath in September 2021.  She has not been having chest pain, back pain (anginal equivalent), or dyspnea at home.  In the setting of above, high-sensitivity troponin rose, and peaked at 250.  She was downtrending yesterday.  Admission ECG with inferolateral ST depression which was more pronounced than seen on prior ECGs.  Echo with normal LV function and without regional wall motion abnormalities.  No plan for additional ischemic evaluation at this time.  Continue aspirin, Plavix, and nitrate.  We will add back carvedilol at a lower dose (previously on 25 mg twice daily) as blood pressures are now rising.  3.  Essential hypertension: Difficult to control the outpatient setting.  Initially normal to only mildly elevated following admission here but pressures have been higher over the past 24 hours.  As previously noted, she was taking max doses of carvedilol, amlodipine, and losartan, along with spironolactone 25 mg, torsemide 20 mg twice daily, and clonidine 0.1 mg twice daily at home.  Going to add back carvedilol 3.125 mg twice daily today and can consider adding back a lower dose of losartan either later today or tomorrow while being mindful of preventing hypotension in the setting of stroke.  4.  Hyperlipidemia: Followed in lipid clinic in [place].  Statin and Repatha therapy as an outpatient.  5.  Peripheral arterial disease: Chronic left greater than right lower extremity claudication with known disease.  Stable ABIs in March 2022.  She remains on aspirin, Plavix, statin, and Repatha.  6.  Carotid arterial disease: Stable disease with patent right carotid endarterectomy on ultrasound in March 2022 and CTA July 2.  7.  Hypoxia/?  Pneumonia: O2 sats in the 80s upon EMS arrival.  CTA chest inadequate for PE eval but noted to have patchy right upper lobe  densities.  Antibiotic therapy per medicine team.  Now saturating in the mid 90s off of oxygen.  8.  Tobacco abuse: Cessation advised.  She says nicotine patch has been helping.  9.  Type 2 diabetes mellitus: A1c 11.5 in March.  Insulin management per medicine team.  Signed, Murray Hodgkins, NP  06/26/2021, 10:07 AM    For questions or updates, please contact   Please consult [email or web address] for contact info under Cardiology/STEMI.

## 2021-06-26 NOTE — Progress Notes (Signed)
Occupational Therapy Treatment Patient Details Name: Erika [name] MRN: [id number] DOB: 1971-07-09 Today's Date: 06/26/2021    History of present illness Pt is a 50 y/o F admitted on 06/24/21 with c/c of slurred speech, L facial droop & LUE weakness. MRI revealed acute R pontine perforator infarct with chronic R parietal & occipital infarcts. PMH: HTN, HLD, DM, tobacco abuse, PAD, CAD, CABG, dCHF, peptic foot drop, renal artery stenosis, CAD (s/p of R CEA 2017), PTSD, adjustment disorder, MI, obesity   OT comments  Pt seen for OT tx today. Pt eager and motivated, spouse present for initial portion of session. Pt able to come sit EOB on L side with supervision and increased effort, cues to attempt to use LUE into task. STS with CGA-MIN A from EOB no AD. Able to brace herself against the sink counter with UE support to collect grooming items. Combination of LUE weightbearing through wrist and elbow for reaching using R hand and also reaching with L hand, crossing midline and reaching outside BOS. L knee blocked as pt fatigued. Pt tolerated weightbearing through L elbow to lean head over sink faucet and with set up and MIN A was able to wash hair with dominant R hand. Once sitting back down, pt returned to supine briefly and then attempted to sit up on the R side of the bed, requiring MIN A for trunk support and cues to support LUE weightbearing on bed to prop herself up. Pt left seated EOB with minor set up for meal, bed alarm on, able to verbalize all swallowing precautions. Speech does sound much clearer today. Pt demonstrates improvements in LUE and LLE functional use. Continues to benefit from high intensity, skilled OT services. Continue to recommend CIR at this time in order to maximize return to PLOF including caregiving for her daughter with special needs.    Follow Up Recommendations  CIR    Equipment Recommendations  None recommended by OT    Recommendations for Other Services       Precautions / Restrictions Precautions Precautions: Fall Precaution Comments: L hemi (UE>LE) Restrictions Weight Bearing Restrictions: No       Mobility Bed Mobility Overal bed mobility: Needs Assistance Bed Mobility: Supine to Sit;Sit to Supine     Supine to sit: Supervision;HOB elevated Sit to supine: Min assist;HOB elevated   General bed mobility comments: Pt sat back down brought BLE into the bed and swung them around to the other side, requiring to sit up on the other side of the bed to eat lunch, educated in BUE positioning to help support LUE into task but ultimately required MIN A for trunk support to rotate to R side    Transfers Overall transfer level: Needs assistance Equipment used: None Transfers: Sit to/from Stand Sit to Stand: Min assist;Min guard         General transfer comment: repeated STS transfers from EOB to standing at sink    Balance Overall balance assessment: Needs assistance Sitting-balance support: No upper extremity supported;Feet supported Sitting balance-Leahy Scale: Good     Standing balance support: Single extremity supported;During functional activity Standing balance-Leahy Scale: Fair Standing balance comment: requires at least UE on counter or hips leaning against counter for stability with dynamic reaching outside BOS                           ADL either performed or assessed with clinical judgement   ADL Overall ADL's : Needs assistance/impaired  Grooming: Wash/dry hands;Standing;Min guard Grooming Details (indicate cue type and reason): CGA, cues for LUE WBing on counter for stability, dynamic reaching outside BOS L and R, alternating BUE Upper Body Bathing: Standing;Minimal assistance Upper Body Bathing Details (indicate cue type and reason): Pt stood at sink, cue for LUE WBing through elbow on counter for stability, L knee blocked, Min A to complete hair washing under sink faucet, using dominany RUE                                  Vision       Perception     Praxis      Cognition Arousal/Alertness: Awake/alert Behavior During Therapy: WFL for tasks assessed/performed Overall Cognitive Status: Within Functional Limits for tasks assessed                                 General Comments: very motivated!, speech improved        Exercises    Shoulder Instructions       General Comments     Pertinent Vitals/ Pain       Pain Assessment: No/denies pain Faces Pain Scale: Hurts a little bit Pain Location: PAD pain in R leg with mobility Pain Descriptors / Indicators: Aching;Sore Pain Intervention(s): Monitored during session;Repositioned  Home Living                                          Prior Functioning/Environment              Frequency  Min 3X/week        Progress Toward Goals  OT Goals(current goals can now be found in the care plan section)  Progress towards OT goals: Progressing toward goals  Acute Rehab OT Goals Patient Stated Goal: to go to rehab to get better so I can take care of my daughter OT Goal Formulation: With patient/family Time For Goal Achievement: 07/08/21 Potential to Achieve Goals: Good  Plan Discharge plan remains appropriate;Frequency remains appropriate    Co-evaluation                 AM-PAC OT "6 Clicks" Daily Activity     Outcome Measure   Help from another person eating meals?: A Little Help from another person taking care of personal grooming?: A Little Help from another person toileting, which includes using toliet, bedpan, or urinal?: A Little Help from another person bathing (including washing, rinsing, drying)?: A Little Help from another person to put on and taking off regular upper body clothing?: A Little Help from another person to put on and taking off regular lower body clothing?: A Little 6 Click Score: 18    End of Session Equipment Utilized During  Treatment: Gait belt  OT Visit Diagnosis: Hemiplegia and hemiparesis Hemiplegia - Right/Left: Left Hemiplegia - dominant/non-dominant: Non-Dominant Hemiplegia - caused by: Cerebral infarction   Activity Tolerance Patient tolerated treatment well   Patient Left in bed;with call bell/phone within reach;with bed alarm set (seated EOB)   Nurse Communication          Time: 8756-4332 OT Time Calculation (min): 28 min  Charges: OT General Charges $OT Visit: 1 Visit OT Treatments $Self Care/Home Management : 23-37 mins  Hanley Hays, MPH, MS, OTR/L ascom  [phone] 06/26/21, 1:26 PM

## 2021-06-26 NOTE — Progress Notes (Signed)
CSW notes plan remains CIR, pending CIR MD review of chart for admission.   [place], [place]

## 2021-06-26 NOTE — Progress Notes (Signed)
Physical Therapy Treatment Patient Details Name: Erika [name] MRN: [id number] DOB: 1971-07-30 Today's Date: 06/26/2021    History of Present Illness Pt is a 50 y/o F admitted on 06/24/21 with c/c of slurred speech, L facial droop & LUE weakness. MRI revealed acute R pontine perforator infarct with chronic R parietal & occipital infarcts. PMH: HTN, HLD, DM, tobacco abuse, PAD, CAD, CABG, dCHF, peptic foot drop, renal artery stenosis, CAD (s/p of R CEA 2017), PTSD, adjustment disorder, MI, obesity    PT Comments    Patient, alert, family at bedside, both pt and family very motivated to work with therapy and maximize pt function. The patient demonstrated improvement in mobility; improved stability of L knee from previous session due to improved quad control, but still requiring RW and step to pattern as well as cueing for heel strike. Pt able to increase ambulation distance with RW. Sit <> stand practiced with minA to encourage L weight shift for LLE participation in transfer. Pt up in chair, all needs in reach. The patient continues to be an excellent candidate for CIR, recommendations remains appropriate.      Follow Up Recommendations  CIR;Supervision for mobility/OOB     Equipment Recommendations  Other (comment) (TBD at next venue)    Recommendations for Other Services       Precautions / Restrictions Precautions Precautions: Fall Precaution Comments: L hemi (UE>LE) Restrictions Weight Bearing Restrictions: No    Mobility  Bed Mobility Overal bed mobility: Needs Assistance Bed Mobility: Supine to Sit     Supine to sit: Supervision;HOB elevated          Transfers Overall transfer level: Needs assistance Equipment used: Rolling walker (2 wheeled) Transfers: Sit to/from Stand Sit to Stand: Min assist;Min guard         General transfer comment: practiced several times, pt able to progress sit <> stands from recliner without UE, minA for L weight  shift  Ambulation/Gait Ambulation/Gait assistance: Min assist Gait Distance (Feet): 50 Feet Assistive device: Rolling walker (2 wheeled) Gait Pattern/deviations: Decreased step length - right;Decreased step length - left;Decreased stride length;Decreased weight shift to left Gait velocity: decreased   General Gait Details: step to pattern, cued for heel strike on LLE. several standing rest breaks   Stairs             Wheelchair Mobility    Modified Rankin (Stroke Patients Only)       Balance Overall balance assessment: Needs assistance Sitting-balance support: No upper extremity supported;Feet supported Sitting balance-Leahy Scale: Good       Standing balance-Leahy Scale: Fair Standing balance comment: more comfortable with at least unilateral UE support                            Cognition Arousal/Alertness: Awake/alert   Overall Cognitive Status: Within Functional Limits for tasks assessed                                 General Comments: Extremely motivated to participate in PT & get better      Exercises Other Exercises Other Exercises: seated L arm exercises: AAROM flexion, AROM supination/pronation, instructed in HEP to work on outside of PT session Other Exercises: LAQx15 seated EOB with LLE, cued for TKE Other Exercises: standing weight shifts with single UE support, minA. sit <> stands without UE x15 with minA to encourage appropriate L  weight shift    General Comments General comments (skin integrity, edema, etc.): spO2 monitored intermittently, >92% on room air throughout session      Pertinent Vitals/Pain Pain Assessment: Faces Faces Pain Scale: Hurts a little bit Pain Location: PAD pain in R leg with mobility Pain Descriptors / Indicators: Aching;Sore Pain Intervention(s): Monitored during session;Repositioned    Home Living                      Prior Function            PT Goals (current goals can  now be found in the care plan section) Progress towards PT goals: Progressing toward goals    Frequency    7X/week      PT Plan Current plan remains appropriate    Co-evaluation              AM-PAC PT "6 Clicks" Mobility   Outcome Measure  Help needed turning from your back to your side while in a flat bed without using bedrails?: A Little Help needed moving from lying on your back to sitting on the side of a flat bed without using bedrails?: A Little Help needed moving to and from a bed to a chair (including a wheelchair)?: A Little Help needed standing up from a chair using your arms (e.g., wheelchair or bedside chair)?: A Little Help needed to walk in hospital room?: A Little Help needed climbing 3-5 steps with a railing? : A Lot 6 Click Score: 17    End of Session Equipment Utilized During Treatment: Gait belt Activity Tolerance: Patient tolerated treatment well Patient left: in chair;with call bell/phone within reach;with family/visitor present Nurse Communication: Mobility status PT Visit Diagnosis: Hemiplegia and hemiparesis;Muscle weakness (generalized) (M62.81);Difficulty in walking, not elsewhere classified (R26.2);Unsteadiness on feet (R26.81) Hemiplegia - Right/Left: Left Hemiplegia - dominant/non-dominant: Non-dominant Hemiplegia - caused by: Cerebral infarction     Time: 6759-1638 PT Time Calculation (min) (ACUTE ONLY): 44 min  Charges:  $Therapeutic Exercise: 8-22 mins $Neuromuscular Re-education: 23-37 mins                     Lieutenant Diego PT, DPT 10:25 AM,06/26/21

## 2021-06-26 NOTE — Progress Notes (Signed)
Inpatient Rehab Admissions:  Inpatient Rehab Consult received.  I spoke with patient and husband on the telephone for rehabilitation assessment and to discuss goals and expectations of an inpatient rehab admission.  Both acknowledged  understanding of goals and expectations. Both interested in pt pursuing CIR. Will continue to follow.  Signed: Gayland Curry, [place], [place] Admissions Coordinator [phone]

## 2021-06-27 ENCOUNTER — Inpatient Hospital Stay: Payer: Managed Care, Other (non HMO)

## 2021-06-27 DIAGNOSIS — I6350 Cerebral infarction due to unspecified occlusion or stenosis of unspecified cerebral artery: Secondary | ICD-10-CM

## 2021-06-27 LAB — HEMOGLOBIN A1C
Hgb A1c MFr Bld: 10.7 % — ABNORMAL HIGH (ref 4.8–5.6)
Mean Plasma Glucose: 260 mg/dL

## 2021-06-27 LAB — GLUCOSE, CAPILLARY
Glucose-Capillary: 95 mg/dL (ref 70–99)
Glucose-Capillary: 172 mg/dL — ABNORMAL HIGH (ref 70–99)
Glucose-Capillary: 201 mg/dL — ABNORMAL HIGH (ref 70–99)
Glucose-Capillary: 194 mg/dL — ABNORMAL HIGH (ref 70–99)

## 2021-06-27 MED ORDER — HYDRALAZINE HCL 20 MG/ML IJ SOLN: 5.0000 mg | INTRAMUSCULAR | Status: DC | PRN

## 2021-06-27 MED ORDER — VITAMIN B-12 1000 MCG PO TABS
1000.0000 ug | ORAL_TABLET | Freq: Every day | ORAL | Status: DC
  Administered 2021-06-28: 1000 ug via ORAL
  Filled 2021-06-27: qty 1

## 2021-06-27 MED ORDER — CARVEDILOL 25 MG PO TABS
25.0000 mg | ORAL_TABLET | Freq: Two times a day (BID) | ORAL | Status: DC
  Administered 2021-06-27 – 2021-06-28 (×2): 25 mg via ORAL
  Filled 2021-06-27 (×2): qty 1

## 2021-06-27 MED ORDER — TRAZODONE HCL 50 MG PO TABS
50.0000 mg | ORAL_TABLET | Freq: Every day | ORAL | Status: DC
  Administered 2021-06-27: 50 mg via ORAL
  Filled 2021-06-27: qty 1

## 2021-06-27 MED ORDER — TORSEMIDE 20 MG PO TABS
20.0000 mg | ORAL_TABLET | Freq: Every day | ORAL | Status: DC
  Administered 2021-06-28: 20 mg via ORAL
  Filled 2021-06-27: qty 1

## 2021-06-27 MED ORDER — ISOSORBIDE MONONITRATE ER 60 MG PO TB24
60.0000 mg | ORAL_TABLET | Freq: Two times a day (BID) | ORAL | Status: DC
  Administered 2021-06-27 – 2021-06-28 (×2): 60 mg via ORAL
  Filled 2021-06-27 (×2): qty 1

## 2021-06-27 MED ORDER — LOSARTAN POTASSIUM 25 MG PO TABS
25.0000 mg | ORAL_TABLET | Freq: Every day | ORAL | Status: DC
  Administered 2021-06-28: 25 mg via ORAL
  Filled 2021-06-27: qty 1

## 2021-06-27 MED ORDER — THIAMINE HCL 100 MG/ML IJ SOLN
100.0000 mg | Freq: Every day | INTRAMUSCULAR | Status: DC
  Administered 2021-06-28: 100 mg via INTRAVENOUS
  Filled 2021-06-27: qty 2

## 2021-06-27 MED ORDER — SODIUM CHLORIDE 0.9 % IV SOLN
INTRAVENOUS | Status: DC | PRN
  Administered 2021-06-27: 1000 mL via INTRAVENOUS

## 2021-06-27 NOTE — Progress Notes (Signed)
PROGRESS NOTE    RAMSHA LONIGRO   EHM:[id number]  DOB: 02/21/1971  PCP: Lesleigh Noe, MD    DOA: 06/24/2021 LOS: 3   Assessment & Plan   Principal Problem:   Stroke [facility]) Active Problems:   HLD (hyperlipidemia)   ADJUSTMENT DISORDER WITH MIXED FEATURES   MDD (major depressive disorder)   Elevated troponin   Tobacco abuse   Essential hypertension   Type 2 diabetes mellitus with other specified complication (HCC)   CAD (coronary artery disease)   Severe sepsis (HCC)   PVD (peripheral vascular disease) (HCC)   Chronic diastolic CHF (congestive heart failure) ([place])   Fall   Abnormal LFTs   Acute respiratory failure with hypoxia (HCC)   Acute Ischemic CVA of right pontine perforator Left-sided weakness, dysarthria Stroke risk factors: CAD, carotid stenosis, HLD, HTN, obesity, PAD, tobacco abuse and DM2 --Neurology following - see their recs. --PT and OT recommend CIR - consult underway. --Telemetry. --Plavix + ASA, Crestor --BP goal 366 to 294 systolic --Strict glycemic control --Tobacco cessation --Neuro checks --Neurology follow up after discharge  Essential hypertension - resistant - soft BP this AM with meds still on hold, otherwise has been elevated overnight.  She typically runs upper 765'Y to 650'P systolic at home.   BP's have been labile here w/intermittent lows. Per Neurology - goal SBP 140-180 --resumed losartan - decreased 100>>25 mg --resumed spironolactone --resumed torsemide 20 mg daily (was on BID) --continue Coreg 25 mg BID (was 37.5 mg) --hold amlodipine and clonidine --PRN hydralazine for SBP>180  Severe sepsis due to RUL Pneumonia -  Acute respiratory failure with hypoxia and sepsis:  Presented with leukocytosis, tachycardia, RUL patchy density on CTA chest consistent with pneumonia. Lactici acidosis reflects organ dysfunction and severe sepsis. Started on empiric CAP coverage with Rocephin/Doxy on admission. Sepsis physiology and  lactic acidosis resolved. --Continue Unasyn for now --Hold off switch to Augmentin until tolerating pills better --Follow blood and sputum cultures, urine antigen of Legionella and strep --stop IV fluids   Fall due to weakness from CVA -PT/OT - CIR recommended, placement pending. Excellent candidate, highly motivated.  Cluster headaches - 100% O2 by NRB mask PRN  Cont home PO analgesics  HLD - Crestor.   On Repatha q14days, last dose on 6/26   ADJUSTMENT DISORDER WITH MIXED FEATURES MDD (major depressive disorder): -Cogentin, Thorazine (pts own med), Klonopin, Lamictal   Elevated troponin due to demand ischemia Hx of CAD: s/p of CABG.  No chest pain, likely due to demand ischemia [facility] cardiology consulted - Crestor, plavix, ASA started per neuro   Tobacco abuse - Nicotine patch   Type 2 diabetes mellitus with other specified complication - Recent T4S 11.5, poorly controlled.  Patient is taking Trulicity, Farxiga, glargine insulin -Sliding scale insulin -Decreased glargine dose from 120 to 90 unit daily -adjust insulin as needed for inpatient goal 140-180   PVD - On Crestor and Plavix   Chronic diastolic CHF - currently compensated.  2D echo on 06/27/2016 showed EF of 50-55% with grade 1 diastolic dysfunction.   No pulmonary edema on chest x-ray.  BNP 33.3,  -resumed torsemide at 20 mg daily (was on BID) -resumed spironolactone, Coreg   Abnormal LFTs: mild -hepatis panel negative   Patient BMI: Body mass index is 29.13 kg/m.   DVT prophylaxis: enoxaparin (LOVENOX) injection 40 mg Start: 06/24/21 2200   Diet:  Diet Orders (From admission, onward)     Start     Ordered  06/26/21 1410  DIET DYS 3 Room service appropriate? Yes with Assist; Fluid consistency: Thin  Diet effective now       Comments: Pt may have baked potato and sweet potato per Speech w/ butter and brown sugar. NO STRAWS!!  Vanilla yogurts only.  Question Answer Comment  Room service appropriate?  Yes with Assist   Fluid consistency: Thin      06/26/21 1412              Code Status: Full Code   Brief Narrative / Hospital Course to Date:   50 y.o. female with medical history significant of prior CVA in ___, HTN, HLD, DM, tobacco abuse, PAD, CAD, CABG, dCHF, peptic foot drop, renal artery stenosis, carotid artery stenosis (s/p of right CEA 2017), PTSD, adjustment disorder, who presented on 06/24/21 with slurred speech, left facial droop, left arm weakness.  She was found to have Right Pontine perforator ischemic infarction on MRI brain, in addition to chronic/prior Right Parietal and Occipital infarcts.  Neurology consulted.  Additional evaluation underway.  PT and OT are recommending CIR for rehab and patient is agreeable.    Subjective 06/27/21    Pt seen with husband at bedside.  Her left sided weakness and speech are significantly worse again today, she had obtained a good bit of recovery yesterday.  She was able to lift left arm up to head height, speech was near normal.  She noted this when waking up this morning.    Husband also reports confusion this AM - patient was talking about seeing deceased family members.  He also notes poor short term memory that it new.      Disposition Plan & Communication   Status is: Inpatient  Remains inpatient appropriate because:Ongoing diagnostic testing needed not appropriate for outpatient work up, CIR consult pending for rehab  Dispo: The patient is from: Home              Anticipated d/c is to: CIR              Patient currently is not medically stable to d/c.   Difficult to place patient No   Family Communication: husband at bedside on rounds    Consults, Procedures, Significant Events   Consultants:  neurology  Procedures:  none  Antimicrobials:  Anti-infectives (From admission, onward)    Start     Dose/Rate Route Frequency Ordered Stop   06/25/21 2200  doxycycline (VIBRA-TABS) tablet 100 mg  Status:   Discontinued        100 mg Oral Every 12 hours 06/25/21 1028 06/25/21 1202   06/25/21 1215  Ampicillin-Sulbactam (UNASYN) 3 g in sodium chloride 0.9 % 100 mL IVPB        3 g 200 mL/hr over 30 Minutes Intravenous Every 6 hours 06/25/21 1202     06/24/21 1145  cefTRIAXone (ROCEPHIN) 1 g in sodium chloride 0.9 % 100 mL IVPB  Status:  Discontinued        1 g 200 mL/hr over 30 Minutes Intravenous Daily 06/24/21 1136 06/25/21 1202   06/24/21 1145  doxycycline (VIBRAMYCIN) 100 mg in sodium chloride 0.9 % 250 mL IVPB  Status:  Discontinued        100 mg 125 mL/hr over 120 Minutes Intravenous 2 times daily 06/24/21 1136 06/25/21 1202         Micro    Objective   Vitals:   06/26/21 2101 06/27/21 0342 06/27/21 0737 06/27/21 1224  BP: (!) 152/77 (!) 145/76 Marland Kitchen)  142/78 126/75  Pulse: 66 73 79 64  Resp: 16     Temp: 97.9 F (36.6 C) (!) 97.5 F (36.4 C) (!) 97.5 F (36.4 C) (!) 97.4 F (36.3 C)  TempSrc: Oral Oral Oral Oral  SpO2: 93% 95% 95% 95%  Weight:      Height:        Intake/Output Summary (Last 24 hours) at 06/27/2021 1612 Last data filed at 06/27/2021 1516 Gross per 24 hour  Intake 1092.01 ml  Output 1640 ml  Net -547.99 ml   Filed Weights   06/24/21 0550  Weight: 84.4 kg    Physical Exam:  General exam: awake, alert, no acute distress HEENT: moist mucus membranes, hearing grossly normal  Respiratory system: generally diminished, rhonchi on right, no wheezes, normal respiratory effort at rest on room air Cardiovascular system: normal S1/S2, RRR, trace pedal edema.   Central nervous system: A&O x4, dysarthric speech worse today, persistent L facial droop, LUE far more weak today (1-2/5 in all), LLE more weak vs yesterday Psychiatry: normal mood, congruent affect, judgement and insight appear normal  Labs   Data Reviewed: I have personally reviewed following labs and imaging studies  CBC: Recent Labs  Lab 06/24/21 0540 06/26/21 0616  WBC 18.6* 11.5*  NEUTROABS  15.7*  --   HGB 15.4* 12.7  HCT 45.2 37.6  MCV 89.2 88.7  PLT 326 175   Basic Metabolic Panel: Recent Labs  Lab 06/24/21 0540 06/26/21 0616  NA 135 137  K 4.0 3.7  CL 98 103  CO2 25 27  GLUCOSE 306* 153*  BUN 15 12  CREATININE 0.80 0.57  CALCIUM 9.9 9.4   GFR: Estimated Creatinine Clearance: 97 mL/min (by C-G formula based on SCr of 0.57 mg/dL). Liver Function Tests: Recent Labs  Lab 06/24/21 0540 06/25/21 0740 06/26/21 0616  AST 63* 52* 32  ALT 68* 59* 47*  ALKPHOS 85 74 72  BILITOT 0.8 0.6 0.5  PROT 7.2 6.7 6.4*  ALBUMIN 3.8 3.6 3.4*   No results for input(s): LIPASE, AMYLASE in the last 168 hours. No results for input(s): AMMONIA in the last 168 hours. Coagulation Profile: Recent Labs  Lab 06/24/21 0540  INR 1.0   Cardiac Enzymes: No results for input(s): CKTOTAL, CKMB, CKMBINDEX, TROPONINI in the last 168 hours. BNP (last 3 results) No results for input(s): PROBNP in the last 8760 hours. HbA1C: Recent Labs    06/25/21 0740  HGBA1C 10.7*   CBG: Recent Labs  Lab 06/26/21 1234 06/26/21 1631 06/26/21 2122 06/27/21 0739 06/27/21 1235  GLUCAP 193* 173* 182* 95 172*   Lipid Profile: Recent Labs    06/25/21 0740  CHOL 202*  HDL 29*  LDLCALC UNABLE TO CALCULATE IF TRIGLYCERIDE OVER 400 mg/dL  TRIG 636*  CHOLHDL 7.0  LDLDIRECT 99.6*   Thyroid Function Tests: No results for input(s): TSH, T4TOTAL, FREET4, T3FREE, THYROIDAB in the last 72 hours. Anemia Panel: No results for input(s): VITAMINB12, FOLATE, FERRITIN, TIBC, IRON, RETICCTPCT in the last 72 hours. Sepsis Labs: Recent Labs  Lab 06/24/21 1305 06/25/21 0748 06/25/21 1111  PROCALCITON <0.10  --   --   LATICACIDVEN 2.5* 1.6 2.2*    Recent Results (from the past 240 hour(s))  Resp Panel by RT-PCR (Flu A&B, Covid) Nasopharyngeal Swab     Status: None   Collection Time: 06/24/21  6:30 AM   Specimen: Nasopharyngeal Swab; Nasopharyngeal(NP) swabs in vial transport medium  Result  Value Ref Range Status   SARS Coronavirus 2  by RT PCR NEGATIVE NEGATIVE Final    Comment: (NOTE) SARS-CoV-2 target nucleic acids are NOT DETECTED.  The SARS-CoV-2 RNA is generally detectable in upper respiratory specimens during the acute phase of infection. The lowest concentration of SARS-CoV-2 viral copies this assay can detect is 138 copies/mL. A negative result does not preclude SARS-Cov-2 infection and should not be used as the sole basis for treatment or other patient management decisions. A negative result may occur with  improper specimen collection/handling, submission of specimen other than nasopharyngeal swab, presence of viral mutation(s) within the areas targeted by this assay, and inadequate number of viral copies(<138 copies/mL). A negative result must be combined with clinical observations, patient history, and epidemiological information. The expected result is Negative.  Fact Sheet for Patients:  EntrepreneurPulse.com.au  Fact Sheet for Healthcare Providers:  IncredibleEmployment.be  This test is no t yet approved or cleared by the [place] FDA and  has been authorized for detection and/or diagnosis of SARS-CoV-2 by FDA under an Emergency Use Authorization (EUA). This EUA will remain  in effect (meaning this test can be used) for the duration of the COVID-19 declaration under Section 564(b)(1) of the Act, 21 U.S.C.section 360bbb-3(b)(1), unless the authorization is terminated  or revoked sooner.       Influenza A by PCR NEGATIVE NEGATIVE Final   Influenza B by PCR NEGATIVE NEGATIVE Final    Comment: (NOTE) The Xpert Xpress SARS-CoV-2/FLU/RSV plus assay is intended as an aid in the diagnosis of influenza from Nasopharyngeal swab specimens and should not be used as a sole basis for treatment. Nasal washings and aspirates are unacceptable for Xpert Xpress SARS-CoV-2/FLU/RSV testing.  Fact Sheet for  Patients: EntrepreneurPulse.com.au  Fact Sheet for Healthcare Providers: IncredibleEmployment.be  This test is not yet approved or cleared by the [place] FDA and has been authorized for detection and/or diagnosis of SARS-CoV-2 by FDA under an Emergency Use Authorization (EUA). This EUA will remain in effect (meaning this test can be used) for the duration of the COVID-19 declaration under Section 564(b)(1) of the Act, 21 U.S.C. section 360bbb-3(b)(1), unless the authorization is terminated or revoked.  Performed at [facility], [address] [place], [place] [address]   Culture, blood (routine x 2) Call MD if unable to obtain prior to antibiotics being given     Status: None (Preliminary result)   Collection Time: 06/25/21  7:40 AM   Specimen: BLOOD  Result Value Ref Range Status   Specimen Description BLOOD BLOOD RIGHT FOREARM  Final   Special Requests   Final    BOTTLES DRAWN AEROBIC AND ANAEROBIC Blood Culture adequate volume   Culture   Final    NO GROWTH 2 DAYS Performed at [facility], [address] [place], [place] [address]    Report Status PENDING  Incomplete  Culture, blood (routine x 2) Call MD if unable to obtain prior to antibiotics being given     Status: None (Preliminary result)   Collection Time: 06/25/21  7:48 AM   Specimen: BLOOD  Result Value Ref Range Status   Specimen Description BLOOD RIGHT WRIST  Final   Special Requests   Final    BOTTLES DRAWN AEROBIC AND ANAEROBIC Blood Culture adequate volume   Culture   Final    NO GROWTH 2 DAYS Performed at [facility], [address], [place], [place] [address]    Report Status PENDING  Incomplete      Imaging Studies   MR BRAIN WO  CONTRAST  Result Date: 06/27/2021 CLINICAL DATA:  Neuro deficit, acute, stroke suspected. EXAM: MRI HEAD WITHOUT CONTRAST TECHNIQUE: Multiplanar, multiecho pulse sequences of the brain and surrounding  structures were obtained without intravenous contrast. COMPARISON:  MRI of the brain June 24, 2021. FINDINGS: Brain: Area of restricted diffusion involving the right side of the pons, consistent with subacute infarct, unchanged from prior MRI. No new focus of restricted diffusion to suggest new acute infarct. No hemorrhage, hydrocephalus, extra-axial collection or mass lesion. Area of encephalomalacia and gliosis in the right parietal and occipital lobes with associated cortical laminar necrosis, unchanged. Scattered foci of T2 hyperintensity are seen within the white matter of the cerebral hemispheres, nonspecific. Vascular: Normal flow voids. Skull and upper cervical spine: Normal marrow signal. Sinuses/Orbits: Negative. Other: None. IMPRESSION: 1. No new acute infarct since prior MRI. 2. Subacute infarct on the right side of the pons, similar to prior MRI. 3. Area of encephalomalacia and gliosis in the right parietooccipital region with associated cortical laminar necrosis, unchanged. 4. Small amount of nonspecific T2 hyperintense lesions of the white matter. Differential diagnosis include chronic microangiopathy, vasculitis and post inflammatory/infectious processes. Electronically Signed   By: Pedro Earls M.D.   On: 06/27/2021 12:47     Medications   Scheduled Meds:  aspirin  81 mg Oral Daily   benztropine  0.5 mg Oral QHS   carvedilol  37.5 mg Oral BID WC   chlorproMAZINE  100 mg Oral QHS   clopidogrel  75 mg Oral Daily   dextromethorphan-guaiFENesin  1 tablet Oral BID   enoxaparin (LOVENOX) injection  40 mg Subcutaneous Q24H   ezetimibe  10 mg Oral Daily   FLUoxetine  60 mg Oral Daily   insulin aspart  0-5 Units Subcutaneous QHS   insulin aspart  0-9 Units Subcutaneous TID WC   insulin glargine  45 Units Subcutaneous BID   isosorbide mononitrate  60 mg Oral BID   lamoTRIgine  200 mg Oral QHS   [START ON 06/28/2021] losartan  25 mg Oral Daily   nicotine  21 mg Transdermal  Daily   pregabalin  225 mg Oral BID   rosuvastatin  20 mg Oral Daily   spironolactone  25 mg Oral Daily   [START ON 06/28/2021] torsemide  20 mg Oral Daily   traZODone  50 mg Oral QHS   Continuous Infusions:  sodium chloride 10 mL/hr at 06/27/21 1516   ampicillin-sulbactam (UNASYN) IV Stopped (06/27/21 1504)       LOS: 3 days    Time spent: 35 minutes with > 50% spent at bedside and in coordination of care.    Ezekiel Slocumb, DO [facility]  06/27/2021, 4:12 PM      If 7PM-7AM, please contact night-coverage. How to contact the [facility] Attending or Consulting provider [place] or covering provider during after hours [place], for this patient?    Check the care team in [facility] and look for a) attending/consulting [facility] provider listed and b) the [facility] team listed Log into [email or web address] and use [place]'s universal password to access. If you do not have the password, please contact the hospital operator. Locate the [facility] provider you are looking for under [facility] and page to a number that you can be directly reached. If you still have difficulty reaching the provider, please page the [name] (Director on Call) for the Hospitalists listed on amion for assistance.

## 2021-06-27 NOTE — Progress Notes (Signed)
PT Cancellation Note  Patient Details Name: Erika [name] MRN: [id number] DOB: 09-27-1971   Cancelled Treatment:    Reason Eval/Treat Not Completed: Patient at procedure or test/unavailable Pt off the floor for procedure. PT to reassess as able.    [employer], SPT

## 2021-06-27 NOTE — Progress Notes (Signed)
Physical Therapy Treatment Patient Details Name: Erika [name] MRN: [id number] DOB: [birth year] Today's Date: 06/27/2021    History of Present Illness Pt is a 50 y/o F admitted on 06/24/21 with c/c of slurred speech, L facial droop & LUE weakness. MRI revealed acute R pontine perforator infarct with chronic R parietal & occipital infarcts. PMH: HTN, HLD, DM, tobacco abuse, PAD, CAD, CABG, dCHF, peptic foot drop, renal artery stenosis, CAD (s/p of R CEA 2017), PTSD, adjustment disorder, MI, obesity    PT Comments    Patient alert, reported 8/10 headache today. Per chart pt underwent MRI due to worsening CVA symptoms, L sided weakness and worsening speech/swallowing. No acute changes on MRI. Pt with flaccidity noted of LUE, and trace muscle activation LLE proximal > distal. The patient remains highly motivated to participate with rehab. Supine <> sit min-modA. Sit <> stand three times with hemiwalker, mod-minA with adequate UE support. Standing weight shifts with L knee blocking performed, and 2-3 lateral side steps taken with facilitation of LLE placement from PT. Returned to bed with all needs in reach. Current recommendations remains appropriate to maximize function, independence, and mobility.      Follow Up Recommendations  CIR;Supervision for mobility/OOB     Equipment Recommendations  Other (comment) (TBD at next venue of care)    Recommendations for Other Services       Precautions / Restrictions Precautions Precautions: Fall Precaution Comments: L hemi (UE>LE) Restrictions Weight Bearing Restrictions: No    Mobility  Bed Mobility Overal bed mobility: Needs Assistance Bed Mobility: Supine to Sit;Sit to Supine     Supine to sit: Mod assist Sit to supine: Mod assist   General bed mobility comments: LLE assist    Transfers Overall transfer level: Needs assistance Equipment used: Hemi-walker Transfers: Sit to/from Stand Sit to Stand: Min assist;Mod assist             Ambulation/Gait Ambulation/Gait assistance: Min assist Gait Distance (Feet): 1 Feet Assistive device: Hemi-walker       General Gait Details: step to R at EOB, physical assist needed for LLE advancement   Stairs             Wheelchair Mobility    Modified Rankin (Stroke Patients Only)       Balance Overall balance assessment: Needs assistance Sitting-balance support: No upper extremity supported;Feet supported Sitting balance-Leahy Scale: Fair     Standing balance support: Single extremity supported Standing balance-Leahy Scale: Poor Standing balance comment: reliant on RUE support                            Cognition Arousal/Alertness: Awake/alert Behavior During Therapy: WFL for tasks assessed/performed Overall Cognitive Status: Within Functional Limits for tasks assessed                                        Exercises Other Exercises Other Exercises: Pt flaccid on LUE this session, trace muscle activation proximal > distal LLE. Other Exercises: PROM LAW x10 on LLE Other Exercises: bridges x3, single leg L leg bridges x5 with PT assist at ankle    General Comments General comments (skin integrity, edema, etc.): spO2 monitored intermittently, >92% on room air      Pertinent Vitals/Pain Pain Assessment: 0-10 Pain Score: 8  Pain Location: "cluster headache" Pain Descriptors / Indicators: Aching;Discomfort Pain Intervention(s): Limited activity  within patient's tolerance;Monitored during session;Repositioned;Premedicated before session    Home Living                      Prior Function            PT Goals (current goals can now be found in the care plan section) Progress towards PT goals: Progressing toward goals    Frequency    7X/week      PT Plan Current plan remains appropriate    Co-evaluation              AM-PAC PT "6 Clicks" Mobility   Outcome Measure  Help needed turning from  your back to your side while in a flat bed without using bedrails?: A Lot Help needed moving from lying on your back to sitting on the side of a flat bed without using bedrails?: A Lot Help needed moving to and from a bed to a chair (including a wheelchair)?: A Lot Help needed standing up from a chair using your arms (e.g., wheelchair or bedside chair)?: A Lot Help needed to walk in hospital room?: A Lot Help needed climbing 3-5 steps with a railing? : Total 6 Click Score: 11    End of Session Equipment Utilized During Treatment: Gait belt Activity Tolerance: Patient tolerated treatment well Patient left: in bed;with call bell/phone within reach;with bed alarm set Nurse Communication: Mobility status PT Visit Diagnosis: Hemiplegia and hemiparesis;Muscle weakness (generalized) (M62.81);Difficulty in walking, not elsewhere classified (R26.2);Unsteadiness on feet (R26.81) Hemiplegia - Right/Left: Left Hemiplegia - dominant/non-dominant: Non-dominant Hemiplegia - caused by: Cerebral infarction     Time: 6553-7482 PT Time Calculation (min) (ACUTE ONLY): 28 min  Charges:  $Therapeutic Exercise: 23-37 mins                    Lieutenant Diego PT, DPT 4:09 PM,06/27/21

## 2021-06-27 NOTE — Plan of Care (Signed)
  Problem: Education: Goal: Knowledge of General Education information will improve Description: Including pain rating scale, medication(s)/side effects and non-pharmacologic comfort measures 06/27/2021 0459 by Fontaine No, RN Outcome: Progressing 06/27/2021 0458 by [place] No, RN Outcome: Progressing   Problem: Health Behavior/Discharge Planning: Goal: Ability to manage health-related needs will improve 06/27/2021 0459 by [place] No, RN Outcome: Progressing 06/27/2021 0458 by [place] No, RN Outcome: Progressing   Problem: Clinical Measurements: Goal: Ability to maintain clinical measurements within normal limits will improve 06/27/2021 0459 by [place] No, RN Outcome: Progressing 06/27/2021 0458 by [place] No, RN Outcome: Progressing Goal: Will remain free from infection 06/27/2021 0459 by [place] No, RN Outcome: Progressing 06/27/2021 0458 by [place] No, RN Outcome: Progressing Goal: Diagnostic test results will improve 06/27/2021 0459 by [place] No, RN Outcome: Progressing 06/27/2021 0458 by [place] No, RN Outcome: Progressing Goal: Respiratory complications will improve 06/27/2021 0459 by [place] No, RN Outcome: Progressing 06/27/2021 0458 by [place] No, RN Outcome: Progressing Goal: Cardiovascular complication will be avoided 06/27/2021 0459 by [place] No, RN Outcome: Progressing 06/27/2021 0458 by [place] No, RN Outcome: Progressing   Problem: Activity: Goal: Risk for activity intolerance will decrease 06/27/2021 0459 by [place] No, RN Outcome: Progressing 06/27/2021 0458 by [place] No, RN Outcome: Progressing   Problem: Nutrition: Goal: Adequate nutrition will be maintained 06/27/2021 0459 by [place] No, RN Outcome: Progressing 06/27/2021 0458 by [place] No, RN Outcome: Progressing   Problem: Coping: Goal: Level of anxiety will decrease 06/27/2021 0459 by [place] No, RN Outcome: Progressing 06/27/2021 0458 by [place] No, RN Outcome: Progressing    Problem: Elimination: Goal: Will not experience complications related to bowel motility 06/27/2021 0459 by [place] No, RN Outcome: Progressing 06/27/2021 0458 by [place] No, RN Outcome: Progressing Goal: Will not experience complications related to urinary retention 06/27/2021 0459 by [place] No, RN Outcome: Progressing 06/27/2021 0458 by [place] No, RN Outcome: Progressing   Problem: Pain Managment: Goal: General experience of comfort will improve 06/27/2021 0459 by Font[place] No, RN Outcome: Progressing 06/27/2021 0458 by [place] No, RN Outcome: Progressing   Problem: Safety: Goal: Ability to remain free from injury will improve 06/27/2021 0459 by Font[place] No, RN Outcome: Progressing 06/27/2021 0458 by Font[place] No, RN Outcome: Progressing   Problem: Skin Integrity: Goal: Risk for impaired skin integrity will decrease 06/27/2021 0459 by Fontaine No, RN Outcome: Progressing 06/27/2021 0458 by Fontaine No, RN Outcome: Progressing

## 2021-06-27 NOTE — Progress Notes (Addendum)
Subjective: The patient feels much worse today in terms of Left sided strength No other acute complaints including headache or other focal neuro signs or symptoms Low BP of 95/81 yesterday morning  Objective: Current vital signs: BP 126/75 (BP Location: Right Arm)   Pulse 64   Temp (!) 97.4 F (36.3 C) (Oral)   Resp 16   Ht 5\' 7"  (1.702 m)   Wt 84.4 kg   SpO2 95%   BMI 29.13 kg/m  Vital signs in last 24 hours: Temp:  [97.4 F (36.3 C)-98.2 F (36.8 C)] 97.4 F (36.3 C) (07/05 1224) Pulse Rate:  [64-88] 64 (07/05 1224) Resp:  [16] 16 (07/04 2101) BP: (126-176)/(75-87) 126/75 (07/05 1224) SpO2:  [93 %-95 %] 95 % (07/05 1224)   Intake/Output from previous day: 07/04 0701 - 07/05 0700 In: 370 [P.O.:370] Out: 500 [Urine:500] Intake/Output this shift: Total I/O In: 842 [P.O.:240; I.V.:2; IV Piggyback:600] Out: 1140 [Urine:1140] Nutritional status:  Diet Order             DIET DYS 3 Room service appropriate? Yes with Assist; Fluid consistency: Thin  Diet effective now                  Physical Exam  HEENT-  [place]/AT    Lungs- Respirations unlabored Extremities- Chronic mild pigmentary changes to distal BLE noted on prior examination, compressive stockings in place today.    Neurological Examination Mental Status: Awake and alert. Fully oriented. Speech is fluent with intact comprehension. No word finding deficit noted. Significant dysarthria continues to be present, patient reports worse than yesterday.  Does have some difficulty with spelling her husband's name Cranial Nerves: II: Tracks and fixates normally.  III,IV, VI: No ptosis. EOMI.  VII:  Left facial droop, pronounced VIII: Hearing intact to conversation IX,X: No hypophonia XI: Left sided weakness  XII: Midline tongue extension Motor: LUE 2/5 deltoid, 2/5 biceps, 0/5 triceps, 1/5 grip LLE 2/5 hip flexion, 4/5 knee extension, 2/5 knee flexion, 1/5 foot dorsi and plantarflexion. Sensory: FT intact x 4   Cerebellar: No ataxia with FNF on the right. Cannot perform on the left.  Gait: Unable to assess.  Lab Results: Results for orders placed or performed during the hospital encounter of 06/24/21 (from the past 48 hour(s))  Glucose, capillary     Status: Abnormal   Collection Time: 06/25/21  9:22 PM  Result Value Ref Range   Glucose-Capillary 227 (H) 70 - 99 mg/dL    Comment: Glucose reference range applies only to samples taken after fasting for at least 8 hours.  Comprehensive metabolic panel     Status: Abnormal   Collection Time: 06/26/21  6:16 AM  Result Value Ref Range   Sodium 137 135 - 145 mmol/L   Potassium 3.7 3.5 - 5.1 mmol/L   Chloride 103 98 - 111 mmol/L   CO2 27 22 - 32 mmol/L   Glucose, Bld 153 (H) 70 - 99 mg/dL    Comment: Glucose reference range applies only to samples taken after fasting for at least 8 hours.   BUN 12 6 - 20 mg/dL   Creatinine, Ser 0.57 0.44 - 1.00 mg/dL   Calcium 9.4 8.9 - 10.3 mg/dL   Total Protein 6.4 (L) 6.5 - 8.1 g/dL   Albumin 3.4 (L) 3.5 - 5.0 g/dL   AST 32 15 - 41 U/L   ALT 47 (H) 0 - 44 U/L   Alkaline Phosphatase 72 38 - 126 U/L   Total Bilirubin 0.5  0.3 - 1.2 mg/dL   GFR, Estimated >60 >60 mL/min    Comment: (NOTE) Calculated using the CKD-EPI Creatinine Equation (2021)    Anion gap 7 5 - 15    Comment: Performed at [facility], [address] [place], [place] [address]  CBC     Status: Abnormal   Collection Time: 06/26/21  6:16 AM  Result Value Ref Range   WBC 11.5 (H) 4.0 - 10.5 K/uL   RBC 4.24 3.87 - 5.11 MIL/uL   Hemoglobin 12.7 12.0 - 15.0 g/dL   HCT 37.6 36.0 - 46.0 %   MCV 88.7 80.0 - 100.0 fL   MCH 30.0 26.0 - 34.0 pg   MCHC 33.8 30.0 - 36.0 g/dL   RDW 13.8 11.5 - 15.5 %   Platelets 322 150 - 400 K/uL   nRBC 0.0 0.0 - 0.2 %    Comment: Performed at [facility], [address] [place],  [address]  Glucose, capillary     Status: Abnormal   Collection Time: 06/26/21  7:28 AM  Result Value  Ref Range   Glucose-Capillary 158 (H) 70 - 99 mg/dL    Comment: Glucose reference range applies only to samples taken after fasting for at least 8 hours.  Glucose, capillary     Status: Abnormal   Collection Time: 06/26/21 12:34 PM  Result Value Ref Range   Glucose-Capillary 193 (H) 70 - 99 mg/dL    Comment: Glucose reference range applies only to samples taken after fasting for at least 8 hours.   Comment 1 Notify RN    Comment 2 Document in Chart   Glucose, capillary     Status: Abnormal   Collection Time: 06/26/21  4:31 PM  Result Value Ref Range   Glucose-Capillary 173 (H) 70 - 99 mg/dL    Comment: Glucose reference range applies only to samples taken after fasting for at least 8 hours.   Comment 1 Notify RN    Comment 2 Document in Chart   Glucose, capillary     Status: Abnormal   Collection Time: 06/26/21  9:22 PM  Result Value Ref Range   Glucose-Capillary 182 (H) 70 - 99 mg/dL    Comment: Glucose reference range applies only to samples taken after fasting for at least 8 hours.  Glucose, capillary     Status: None   Collection Time: 06/27/21  7:39 AM  Result Value Ref Range   Glucose-Capillary 95 70 - 99 mg/dL    Comment: Glucose reference range applies only to samples taken after fasting for at least 8 hours.   Comment 1 Notify RN    Comment 2 Document in Chart   Glucose, capillary     Status: Abnormal   Collection Time: 06/27/21 12:35 PM  Result Value Ref Range   Glucose-Capillary 172 (H) 70 - 99 mg/dL    Comment: Glucose reference range applies only to samples taken after fasting for at least 8 hours.   Comment 1 Notify RN    Comment 2 Document in Chart     Recent Results (from the past 240 hour(s))  Resp Panel by RT-PCR (Flu A&B, Covid) Nasopharyngeal Swab     Status: None   Collection Time: 06/24/21  6:30 AM   Specimen: Nasopharyngeal Swab; Nasopharyngeal(NP) swabs in vial transport medium  Result Value Ref Range Status   SARS Coronavirus 2 by RT PCR NEGATIVE  NEGATIVE Final    Comment: (NOTE) SARS-CoV-2 target nucleic acids are NOT DETECTED.  The SARS-CoV-2 RNA is generally detectable in upper respiratory specimens during the acute phase of infection. The lowest concentration of SARS-CoV-2 viral copies this assay can detect is 138 copies/mL. A negative result does not preclude SARS-Cov-2 infection and should not be used as the sole basis for treatment or other patient management decisions. A negative result may occur with  improper specimen collection/handling, submission of specimen other than nasopharyngeal swab, presence of viral mutation(s) within the areas targeted by this assay, and inadequate number of viral copies(<138 copies/mL). A negative result must be combined with clinical observations, patient history, and epidemiological information. The expected result is Negative.  Fact Sheet for Patients:  EntrepreneurPulse.com.au  Fact Sheet for Healthcare Providers:  IncredibleEmployment.be  This test is no t yet approved or cleared by the [place] FDA and  has been authorized for detection and/or diagnosis of SARS-CoV-2 by FDA under an Emergency Use Authorization (EUA). This EUA will remain  in effect (meaning this test can be used) for the duration of the COVID-19 declaration under Section 564(b)(1) of the Act, 21 U.S.C.section 360bbb-3(b)(1), unless the authorization is terminated  or revoked sooner.       Influenza A by PCR NEGATIVE NEGATIVE Final   Influenza B by PCR NEGATIVE NEGATIVE Final    Comment: (NOTE) The Xpert Xpress SARS-CoV-2/FLU/RSV plus assay is intended as an aid in the diagnosis of influenza from Nasopharyngeal swab specimens and should not be used as a sole basis for treatment. Nasal washings and aspirates are unacceptable for Xpert Xpress SARS-CoV-2/FLU/RSV testing.  Fact Sheet for Patients: EntrepreneurPulse.com.au  Fact Sheet for Healthcare  Providers: IncredibleEmployment.be  This test is not yet approved or cleared by the [place] FDA and has been authorized for detection and/or diagnosis of SARS-CoV-2 by FDA under an Emergency Use Authorization (EUA). This EUA will remain in effect (meaning this test can be used) for the duration of the COVID-19 declaration under Section 564(b)(1) of the Act, 21 U.S.C. section 360bbb-3(b)(1), unless the authorization is terminated or revoked.  Performed at [facility], [address] [place], [place] [address]   Culture, blood (routine x 2) Call MD if unable to obtain prior to antibiotics being given     Status: None (Preliminary result)   Collection Time: 06/25/21  7:40 AM   Specimen: BLOOD  Result Value Ref Range Status   Specimen Description BLOOD BLOOD RIGHT FOREARM  Final   Special Requests   Final    BOTTLES DRAWN AEROBIC AND ANAEROBIC Blood Culture adequate volume   Culture   Final    NO GROWTH 2 DAYS Performed at [facility], [address], [place], [place] [address]    Report Status PENDING  Incomplete  Culture, blood (routine x 2) Call MD if unable to obtain prior to antibiotics being given     Status: None (Preliminary result)   Collection Time: 06/25/21  7:48 AM   Specimen: BLOOD  Result Value Ref Range Status   Specimen Description BLOOD RIGHT WRIST  Final   Special Requests   Final    BOTTLES DRAWN AEROBIC AND ANAEROBIC Blood Culture adequate volume   Culture   Final    NO GROWTH 2 DAYS Performed at [facility], [address] [place], [place] [address]    Report Status PENDING  Incomplete    Lipid Panel Recent Labs    06/25/21 0740  CHOL 202*  TRIG 636*  HDL 29*  CHOLHDL 7.0  VLDL UNABLE TO CALCULATE IF TRIGLYCERIDE  OVER 400 mg/dL  LDLCALC UNABLE TO CALCULATE IF TRIGLYCERIDE OVER 400 mg/dL    Lab Results  Component Value Date   CHOL 202 (H) 06/25/2021   HDL 29 (L) 06/25/2021   LDLCALC  UNABLE TO CALCULATE IF TRIGLYCERIDE OVER 400 mg/dL 06/25/2021   LDLDIRECT 99.6 (H) 06/25/2021   TRIG 636 (H) 06/25/2021   CHOLHDL 7.0 06/25/2021     Studies/Results:  Personally reviewed, agree with radiology:  MR BRAIN WO CONTRAST  Result Date: 06/27/2021 CLINICAL DATA:  Neuro deficit, acute, stroke suspected. EXAM: MRI HEAD WITHOUT CONTRAST TECHNIQUE: Multiplanar, multiecho pulse sequences of the brain and surrounding structures were obtained without intravenous contrast. COMPARISON:  MRI of the brain June 24, 2021. FINDINGS: Brain: Area of restricted diffusion involving the right side of the pons, consistent with subacute infarct, unchanged from prior MRI. No new focus of restricted diffusion to suggest new acute infarct. No hemorrhage, hydrocephalus, extra-axial collection or mass lesion. Area of encephalomalacia and gliosis in the right parietal and occipital lobes with associated cortical laminar necrosis, unchanged. Scattered foci of T2 hyperintensity are seen within the white matter of the cerebral hemispheres, nonspecific. Vascular: Normal flow voids. Skull and upper cervical spine: Normal marrow signal. Sinuses/Orbits: Negative. Other: None. IMPRESSION: 1. No new acute infarct since prior MRI. 2. Subacute infarct on the right side of the pons, similar to prior MRI. 3. Area of encephalomalacia and gliosis in the right parietooccipital region with associated cortical laminar necrosis, unchanged. 4. Small amount of nonspecific T2 hyperintense lesions of the white matter. Differential diagnosis include chronic microangiopathy, vasculitis and post inflammatory/infectious processes. Electronically Signed   By: Pedro Earls M.D.   On: 06/27/2021 12:47    Medications: Scheduled:  aspirin  81 mg Oral Daily   benztropine  0.5 mg Oral QHS   carvedilol  37.5 mg Oral BID WC   chlorproMAZINE  100 mg Oral QHS   clopidogrel  75 mg Oral Daily   dextromethorphan-guaiFENesin  1 tablet Oral  BID   enoxaparin (LOVENOX) injection  40 mg Subcutaneous Q24H   ezetimibe  10 mg Oral Daily   FLUoxetine  60 mg Oral Daily   insulin aspart  0-5 Units Subcutaneous QHS   insulin aspart  0-9 Units Subcutaneous TID WC   insulin glargine  45 Units Subcutaneous BID   isosorbide mononitrate  60 mg Oral BID   lamoTRIgine  200 mg Oral QHS   losartan  100 mg Oral Daily   nicotine  21 mg Transdermal Daily   pregabalin  225 mg Oral BID   rosuvastatin  20 mg Oral Daily   spironolactone  25 mg Oral Daily   torsemide  20 mg Oral BID   traZODone  50 mg Oral QHS   Continuous:  sodium chloride 10 mL/hr at 06/27/21 1516   ampicillin-sulbactam (UNASYN) IV Stopped (06/27/21 1504)    Labs:  Lab Results  Component Value Date   HGBA1C 10.7 (H) 06/25/2021   Lab Results  Component Value Date   CHOL 202 (H) 06/25/2021   HDL 29 (L) 06/25/2021   LDLCALC UNABLE TO CALCULATE IF TRIGLYCERIDE OVER 400 mg/dL 06/25/2021   LDLDIRECT 99.6 (H) 06/25/2021   TRIG 636 (H) 06/25/2021   CHOLHDL 7.0 06/25/2021     Lab Results  Component Value Date   TSH 1.26 11/21/2020     MRI brain 7/5 compared to 7/2: Acute right pontine perforator infarct, stable on MRI today (personally reviewed)     Assessment: 50 year old  female presenting with acute onset of left sided weakness on 7/2 and evaluated by Teleneurology. Right hemispheric strokes of indeterminate age seen on CT at two locations. Determined not to be a tPA candidate by Teleneurology. MRI brain reveals an acute right pontine perforator ischemic infarction. 1. Exam today reveals dysarthria, left facial droop and left sided motor deficits, worse since last exam.  2. MRI brain: Acute right pontine perforator infarct. Right parietal and occipital infarcts are chronic. Mild chronic microvascular ischemic changes. Mild burden of scattered chronic microhemorrhages.  3. CTA negative for LVO, but significant atherosclerotic disease especially in the posterior  circulation (personally re-reviewed today)  4. Stroke risk factors: CAD, carotid arterial disease, uncontrolled HLD, hypertension, obesity, PAD, tobacco abuse and uncontrolled DM2 5. Elevated troponin.  6. Leukocytosis, appears chronic 7. TTE completed: LVEF is 60 to 65%. The left ventricle has normal function, with no regional wall motion abnormalities. Left ventricular diastolic parameters were normal. No mural thrombus, valvular vegetation or ASD is seen.   8. The patient would likely remain a good candidate for inpatient rehab. 9. Husband describes waxing and waning confusion most consistent with delirium  Worsening today likely completion of stuttering lacunar stroke, less likely due to labile BP given no watershed distribution of strokes. However, recommend liberal BP control for now as below.    Recommendations: - Continue frequent neuro checks while inpatient - Smoking cessation discussed at length with patient and husband perviously by Dr. Cheral Marker, quit per cardiology notes  - Appreciate PT/OT/Speech - Continue Plavix and newly-started ASA, at minimum for 3 months but consider life-long DAPT given severity of intracranial atheroscelrosis. - Continue rosuvastatin, referal to advanced lipid clinic given severity of dyslipidemia (referral placed) - BP management. Now out of the permissive HTN time window, however severity of intracranial atherosclerosis may require higher BP goal than normal. Recommend targeting goal SBP 140-180, with further refinement on an outpatient basis - Glycemic control - workup of leukocytosis per primary team / PCP  - Reversible causes of delirium workup: B12 (supplement as needed for goal > 400), TSH, RPR, HIV, Thiamine level, ammonia; empiric 1000 mcg B12 daily and 100 mg B1 daily pending lab results. Primary team / PCP to follow-up labs and treat as needed - Will need outpatient Neurology follow up after discharge.    - Neurohospitalist service will sign  off. Please call if there are additional questions.    LOS: 3 days   Lesleigh Noe MD-PhD [facility] [phone]  06/27/2021  3:37 PM  Greater than 35 minutes spent in care of this patient today including discussion with patient, husband, and Dr. Arbutus Ped

## 2021-06-27 NOTE — Progress Notes (Signed)
Progress Note  Patient Name: Erika [name] Date of Encounter: 06/27/2021  Primary Cardiologist: [name], MD   Subjective   She denies chest pain or shortness of breath.    She had a repeat MRI earlier today and states it was without new findings from her initial MRI. She continues to note left-sided weakness since her CVA.  She is unable to move her left arm.  Speech is noted to still be slurred.    In terms of BP, home blood pressure reportedly SBP 190s and DBP's 110s.  She occasionally also has lower blood pressures with her husband reporting she ask like a "drunken fish" at that time.  She reports bilateral discoloration of her lower extremities without any pain at this time.    She reports that she quit smoking earlier this year.    Inpatient Medications    Scheduled Meds:  aspirin  81 mg Oral Daily   benztropine  0.5 mg Oral QHS   carvedilol  37.5 mg Oral BID WC   chlorproMAZINE  100 mg Oral QHS   chlorproMAZINE  100 mg Oral QHS   clopidogrel  75 mg Oral Daily   dextromethorphan-guaiFENesin  1 tablet Oral BID   enoxaparin (LOVENOX) injection  40 mg Subcutaneous Q24H   ezetimibe  10 mg Oral Daily   FLUoxetine  60 mg Oral Daily   insulin aspart  0-5 Units Subcutaneous QHS   insulin aspart  0-9 Units Subcutaneous TID WC   insulin glargine  45 Units Subcutaneous BID   isosorbide mononitrate  30-60 mg Oral BID   lamoTRIgine  200 mg Oral QHS   losartan  100 mg Oral Daily   nicotine  21 mg Transdermal Daily   pregabalin  225 mg Oral BID   rosuvastatin  20 mg Oral Daily   spironolactone  25 mg Oral Daily   torsemide  20 mg Oral BID   traZODone  50 mg Oral QHS   Continuous Infusions:  ampicillin-sulbactam (UNASYN) IV 3 g (06/27/21 0528)   PRN Meds: albuterol, clonazePAM, hydrALAZINE, nitroGLYCERIN, ondansetron (ZOFRAN) IV, oxyCODONE, senna-docusate   Vital Signs    Vitals:   06/26/21 2101 06/27/21 0342 06/27/21 0737 06/27/21 1224  BP: (!) 152/77 (!)  145/76 (!) 142/78 126/75  Pulse: 66 73 79 64  Resp: 16     Temp: 97.9 F (36.6 C) (!) 97.5 F (36.4 C) (!) 97.5 F (36.4 C) (!) 97.4 F (36.3 C)  TempSrc: Oral Oral Oral Oral  SpO2: 93% 95% 95% 95%  Weight:      Height:        Intake/Output Summary (Last 24 hours) at 06/27/2021 1426 Last data filed at 06/27/2021 1245 Gross per 24 hour  Intake 250 ml  Output 1200 ml  Net -950 ml   Last 3 Weights 06/24/2021 05/25/2021 05/10/2021  Weight (lbs) 186 lb 196 lb 198 lb  Weight (kg) 84.369 kg 88.905 kg 89.812 kg  Some encounter information is confidential and restricted. Go to Review Flowsheets activity to see all data.      Telemetry    Sinus rhythm- Personally Reviewed  ECG    No new tracings- Personally Reviewed  Physical Exam   GEN: No acute distress. Joined by her husband.  Neck: No JVD Cardiac: RRR, no murmurs, rubs, or gallops.  Respiratory: Clear to auscultation bilaterally. GI: Soft, nontender, non-distended  MS: No significant edema; No deformity. Neuro:  Nonfocal  Psych: Normal affect   Labs    High  Sensitivity Troponin:   Recent Labs  Lab 06/24/21 0552 06/24/21 0812 06/24/21 1305 06/25/21 1111  TROPONINIHS 106* 172* 250* 150*      Chemistry Recent Labs  Lab 06/24/21 0540 06/25/21 0740 06/26/21 0616  NA 135  --  137  K 4.0  --  3.7  CL 98  --  103  CO2 25  --  27  GLUCOSE 306*  --  153*  BUN 15  --  12  CREATININE 0.80  --  0.57  CALCIUM 9.9  --  9.4  PROT 7.2 6.7 6.4*  ALBUMIN 3.8 3.6 3.4*  AST 63* 52* 32  ALT 68* 59* 47*  ALKPHOS 85 74 72  BILITOT 0.8 0.6 0.5  GFRNONAA >60  --  >60  ANIONGAP 12  --  7     Hematology Recent Labs  Lab 06/24/21 0540 06/26/21 0616  WBC 18.6* 11.5*  RBC 5.07 4.24  HGB 15.4* 12.7  HCT 45.2 37.6  MCV 89.2 88.7  MCH 30.4 30.0  MCHC 34.1 33.8  RDW 13.9 13.8  PLT 326 322    BNP Recent Labs  Lab 06/24/21 0540  BNP 33.3     DDimer No results for input(s): DDIMER in the last 168 hours.    Radiology    MR BRAIN WO CONTRAST  Result Date: 06/27/2021 CLINICAL DATA:  Neuro deficit, acute, stroke suspected. EXAM: MRI HEAD WITHOUT CONTRAST TECHNIQUE: Multiplanar, multiecho pulse sequences of the brain and surrounding structures were obtained without intravenous contrast. COMPARISON:  MRI of the brain June 24, 2021. FINDINGS: Brain: Area of restricted diffusion involving the right side of the pons, consistent with subacute infarct, unchanged from prior MRI. No new focus of restricted diffusion to suggest new acute infarct. No hemorrhage, hydrocephalus, extra-axial collection or mass lesion. Area of encephalomalacia and gliosis in the right parietal and occipital lobes with associated cortical laminar necrosis, unchanged. Scattered foci of T2 hyperintensity are seen within the white matter of the cerebral hemispheres, nonspecific. Vascular: Normal flow voids. Skull and upper cervical spine: Normal marrow signal. Sinuses/Orbits: Negative. Other: None. IMPRESSION: 1. No new acute infarct since prior MRI. 2. Subacute infarct on the right side of the pons, similar to prior MRI. 3. Area of encephalomalacia and gliosis in the right parietooccipital region with associated cortical laminar necrosis, unchanged. 4. Small amount of nonspecific T2 hyperintense lesions of the white matter. Differential diagnosis include chronic microangiopathy, vasculitis and post inflammatory/infectious processes. Electronically Signed   By: Pedro Earls M.D.   On: 06/27/2021 12:47    Cardiac Studies   a. 06/2016 Cath: ostLM 40%, ostLAD 40%, pLAD 95%, ost-pLCx 60%, pLCx 95%, mLCx 60%, mRCA 95%, D2 50%, LVSF nl b. 07/2016 CABG x 4 (LIMA->LAD, VG->Diag, VG->OM, VG->RCA) c. 08/2016 Cath: 3VD w/ 4/4 patent grafts. LAD distal to LIMA has diff dzs->Med rx d. 08/2020 Cath: 4/4 patent grafts, native 3VD. EF 55-65%-->Med Rx. _____________  2D Echocardiogram 7.3.2022    1. Left ventricular ejection fraction, by  estimation, is 60 to 65%. The  left ventricle has normal function. The left ventricle has no regional  wall motion abnormalities. Left ventricular diastolic parameters were  normal.   2. Right ventricular systolic function is normal. The right ventricular  size is normal. Tricuspid regurgitation signal is inadequate for assessing  PA pressure.   3. The mitral valve is normal in structure. No evidence of mitral valve  regurgitation. No evidence of mitral stenosis.   4. The aortic valve is normal  in structure. Aortic valve regurgitation is  not visualized. No aortic stenosis is present.   5. The inferior vena cava is normal in size with greater than 50%  respiratory variability, suggesting right atrial pressure of 3 mmHg.  Patient Profile     50 y.o. female history of 3v CAD s/p CABG, carotid disease s/p right carotid endarterectomy x2, HFpEF, hypertension, HLD / hypertiglyceridemia on statin and Repatha, PAD s/p stenting left SFA with severe bilateral dSFA occlusion and bilateral calf claudication symptoms, prior tobacco use (recently quit smoking), DM2 on insulin, nonalcoholic fatty liver dz, neuropathy, obesity, and who presented 7/2 with left-sided weakness and stroke and was noted to have elevated high-sensitivity troponin (106  172  250  150).  Assessment & Plan    Acute right hemispheric stroke --Ongoing symptoms of left-sided weakness, left facial droop, and slurred speech since developing her initial symptoms just after midnight 7/2.  7/2 brain MRI with acute right pontine perforator infarct and chronic right parietal and occipital infarcts.  Repeat 7/5 MRI with no acute findings since prior MRI.  No large vessel occlusions of CTA head neck.  Echo with normal LV function and without evidence of ASD or PFO.  No thrombus or vegetation noted.  No arrhythmias on telemetry.  Neurology has recommended Plavix and ASA, as well as continued LDL and glycemic control.  Also recommended his BP  management, as she is now out of the permissive hypertension time window.  PT/OT/speech/rehabilitation medicine consults have been placed.  Neurology outpatient follow-up recommended after discharge.  Smoking cessation recommended --currently on nicotine patches, though she reports she has quit smoking.  CAD with elevated high-sensitivity troponin --No reported chest pain.  History of multivessel CAD s/p CABG x4 in 2017 with patent grafts on repeat 08/2020 cath.  High-sensitivity troponin peaked at 250, downtrending.  EKG at admission with inferior lateral ST depression, more pronounced than on prior EKGs.  Echo with normal LV function, NR WMA.  No plan for further ischemic work-up this admission.  Continue ASA 81 mg daily, Plavix 75 mg daily, carvedilol 37.5 mg twice daily, Imdur 60 mg BID, and statin with PCSK9 inhibitor/Repatha.  Zetia 10 mg daily added today.  Smoking cessation recommended, as well as dietary changes and increase of activity as tolerated.  Essential hypertension --Reports labile BP in the outpatient setting with SBP 190s and DBP 110s, though at times also experiencing symptomatic hypotension.  BP over the last 24 hours has been better controlled on carvedilol 37.5 mg twice daily, losartan 100 mg daily, spironolactone 25 mg daily, torsemide 20mg  daily.  As recommended by neurology, BP control recommended now that she is out of the permissive hypertension window.  Continue to monitor blood pressure at home.  HLD, hypertriglyceridemia --Followed by the lipid clinic in [place].  Currently on Crestor 20 mg daily and Repatha therapy as an outpatient.  Agreeable to adding Zetia 10 mg daily to her medications.  If further control is needed on repeat lipid and liver function in 6 to 8 weeks, we will need to reassess if she is able to increase to Crestor 40 mg daily.  Peripheral arterial disease --Chronic left greater than right lower extremity claudication with known disease followed by Dr.  Fletcher Anon.  Stable ABIs in March 2022.  Continue ASA, Plavix, statin, Repatha.  LDL glycemic control recommended.  Carotid arterial disease s/p R carotid endarcterectomy  - Stable disease with patent right carotid endarcterectomy as of carotid ultrasound 02/2021 and CTA 7/2.  LDL and  glycemic control recommended as above.  ? Previous hypoxia -- Oxygen saturations in the 80s upon EMS arrival.  Started on antibiotics per internal medicine.  Oxygen saturations have since improved.  Continue to monitor.  Will defer to IM.  Tobacco use --Complete cessation recommended.  She reports that she is quit smoking, though on review of her medications list, she is still on a nicotine patch.  Stressed the importance of quitting smoking.  DM2 --Hemoglobin A1c 11.5 02/2021.  SSI.  Per IM/PCP.   For questions or updates, please contact [facility] Please consult [email or web address] for contact info under        Signed, Arvil Chaco, PA-C  06/27/2021, 2:26 PM

## 2021-06-27 NOTE — Progress Notes (Signed)
OT Cancellation Note  Patient Details Name: Erika [name] MRN: [id number] DOB: [birth year]   Cancelled Treatment:    Reason Eval/Treat Not Completed: Patient at procedure or test/ unavailable. Pt with transport staff for MRI. Will re-attempt OT tx at later date/time as pt is available and medically appropriate.   Hanley Hays, MPH, MS, OTR/L ascom [phone] 06/27/21, 11:25 AM

## 2021-06-27 NOTE — Progress Notes (Addendum)
SLP Cancellation Note  Patient Details Name: Erika [name] MRN: [id number] DOB: 09-Jan-1971   Cancelled treatment:       Reason Eval/Treat Not Completed: Medical issues which prohibited therapy (chart reviewed; consulted Husband. Pt out of room at Imaging.). Per chart and Husband's report, pt underwent repeat MRI due to worsening CVA symptoms last night including L sided weakness and worsening speech/swallowing. No acute changes on MRI. Speech improved this morning per report. Husband stated pt's swallowing was adequate for current diet when he "helped her by cutting up all of her foods" at breakfast this morning. Pt is currently on a dysphagia level 3 w/ thin liquids w/ aspiration precautions. Neurology to f/u today per NSG. Recommended strict aspiration precautions. ST services will f/u w/ pt tomorrow for toleration of diet and dysarthria exs. Husband agreed. NSG updated.      Orinda Kenner, MS, CCC-SLP Speech Language Pathologist Rehab Services [phone] [facility] 06/27/2021, 4:30 PM

## 2021-06-27 NOTE — Plan of Care (Signed)

## 2021-06-27 NOTE — Progress Notes (Signed)
Notified by DO Arbutus Ped that Neurology wants patient's SBP 140-180. DO made medication adjustments accordingly.

## 2021-06-28 ENCOUNTER — Encounter (HOSPITAL_COMMUNITY): Payer: Self-pay | Admitting: Physical Medicine and Rehabilitation

## 2021-06-28 ENCOUNTER — Other Ambulatory Visit: Payer: Self-pay

## 2021-06-28 ENCOUNTER — Inpatient Hospital Stay (HOSPITAL_COMMUNITY)
Admission: RE | Admit: 2021-06-28 | Discharge: 2021-08-01 | DRG: 056 | Disposition: A | Discharge status: Home / Self Care | Payer: Managed Care, Other (non HMO) | Source: Other Acute Inpatient Hospital | Attending: Physical Medicine and Rehabilitation | Admitting: Physical Medicine and Rehabilitation

## 2021-06-28 DIAGNOSIS — I252 Old myocardial infarction: Secondary | ICD-10-CM | POA: Diagnosis not present

## 2021-06-28 DIAGNOSIS — I69354 Hemiplegia and hemiparesis following cerebral infarction affecting left non-dominant side: Principal | ICD-10-CM

## 2021-06-28 DIAGNOSIS — E669 Obesity, unspecified: Secondary | ICD-10-CM | POA: Diagnosis present

## 2021-06-28 DIAGNOSIS — K5900 Constipation, unspecified: Secondary | ICD-10-CM | POA: Diagnosis present

## 2021-06-28 DIAGNOSIS — F5104 Psychophysiologic insomnia: Secondary | ICD-10-CM | POA: Diagnosis present

## 2021-06-28 DIAGNOSIS — I6389 Other cerebral infarction: Secondary | ICD-10-CM | POA: Diagnosis not present

## 2021-06-28 DIAGNOSIS — E559 Vitamin D deficiency, unspecified: Secondary | ICD-10-CM | POA: Diagnosis present

## 2021-06-28 DIAGNOSIS — J690 Pneumonitis due to inhalation of food and vomit: Secondary | ICD-10-CM | POA: Diagnosis not present

## 2021-06-28 DIAGNOSIS — Z7982 Long term (current) use of aspirin: Secondary | ICD-10-CM

## 2021-06-28 DIAGNOSIS — D75839 Thrombocytosis, unspecified: Secondary | ICD-10-CM | POA: Diagnosis present

## 2021-06-28 DIAGNOSIS — G44029 Chronic cluster headache, not intractable: Secondary | ICD-10-CM | POA: Diagnosis present

## 2021-06-28 DIAGNOSIS — F329 Major depressive disorder, single episode, unspecified: Secondary | ICD-10-CM | POA: Diagnosis present

## 2021-06-28 DIAGNOSIS — Z833 Family history of diabetes mellitus: Secondary | ICD-10-CM

## 2021-06-28 DIAGNOSIS — G3184 Mild cognitive impairment, so stated: Secondary | ICD-10-CM | POA: Diagnosis present

## 2021-06-28 DIAGNOSIS — Z7902 Long term (current) use of antithrombotics/antiplatelets: Secondary | ICD-10-CM

## 2021-06-28 DIAGNOSIS — R0902 Hypoxemia: Secondary | ICD-10-CM | POA: Diagnosis not present

## 2021-06-28 DIAGNOSIS — F17201 Nicotine dependence, unspecified, in remission: Secondary | ICD-10-CM | POA: Diagnosis present

## 2021-06-28 DIAGNOSIS — D72829 Elevated white blood cell count, unspecified: Secondary | ICD-10-CM

## 2021-06-28 DIAGNOSIS — I672 Cerebral atherosclerosis: Secondary | ICD-10-CM | POA: Diagnosis present

## 2021-06-28 DIAGNOSIS — I959 Hypotension, unspecified: Secondary | ICD-10-CM | POA: Diagnosis not present

## 2021-06-28 DIAGNOSIS — J9601 Acute respiratory failure with hypoxia: Secondary | ICD-10-CM | POA: Diagnosis not present

## 2021-06-28 DIAGNOSIS — I6350 Cerebral infarction due to unspecified occlusion or stenosis of unspecified cerebral artery: Secondary | ICD-10-CM

## 2021-06-28 DIAGNOSIS — I69392 Facial weakness following cerebral infarction: Secondary | ICD-10-CM | POA: Diagnosis not present

## 2021-06-28 DIAGNOSIS — K760 Fatty (change of) liver, not elsewhere classified: Secondary | ICD-10-CM | POA: Diagnosis present

## 2021-06-28 DIAGNOSIS — R778 Other specified abnormalities of plasma proteins: Secondary | ICD-10-CM | POA: Diagnosis not present

## 2021-06-28 DIAGNOSIS — G43909 Migraine, unspecified, not intractable, without status migrainosus: Secondary | ICD-10-CM | POA: Diagnosis present

## 2021-06-28 DIAGNOSIS — Z8249 Family history of ischemic heart disease and other diseases of the circulatory system: Secondary | ICD-10-CM

## 2021-06-28 DIAGNOSIS — I10 Essential (primary) hypertension: Secondary | ICD-10-CM | POA: Diagnosis present

## 2021-06-28 DIAGNOSIS — E876 Hypokalemia: Secondary | ICD-10-CM | POA: Diagnosis present

## 2021-06-28 DIAGNOSIS — Z9889 Other specified postprocedural states: Secondary | ICD-10-CM | POA: Diagnosis not present

## 2021-06-28 DIAGNOSIS — M792 Neuralgia and neuritis, unspecified: Secondary | ICD-10-CM | POA: Diagnosis present

## 2021-06-28 DIAGNOSIS — F17210 Nicotine dependence, cigarettes, uncomplicated: Secondary | ICD-10-CM | POA: Diagnosis present

## 2021-06-28 DIAGNOSIS — Z794 Long term (current) use of insulin: Secondary | ICD-10-CM

## 2021-06-28 DIAGNOSIS — F418 Other specified anxiety disorders: Secondary | ICD-10-CM

## 2021-06-28 DIAGNOSIS — E11649 Type 2 diabetes mellitus with hypoglycemia without coma: Secondary | ICD-10-CM | POA: Diagnosis not present

## 2021-06-28 DIAGNOSIS — I110 Hypertensive heart disease with heart failure: Secondary | ICD-10-CM | POA: Diagnosis present

## 2021-06-28 DIAGNOSIS — I69322 Dysarthria following cerebral infarction: Secondary | ICD-10-CM

## 2021-06-28 DIAGNOSIS — F4312 Post-traumatic stress disorder, chronic: Secondary | ICD-10-CM | POA: Diagnosis not present

## 2021-06-28 DIAGNOSIS — Z823 Family history of stroke: Secondary | ICD-10-CM

## 2021-06-28 DIAGNOSIS — Z79899 Other long term (current) drug therapy: Secondary | ICD-10-CM

## 2021-06-28 DIAGNOSIS — E1169 Type 2 diabetes mellitus with other specified complication: Secondary | ICD-10-CM | POA: Diagnosis present

## 2021-06-28 DIAGNOSIS — I739 Peripheral vascular disease, unspecified: Secondary | ICD-10-CM | POA: Diagnosis not present

## 2021-06-28 DIAGNOSIS — K219 Gastro-esophageal reflux disease without esophagitis: Secondary | ICD-10-CM

## 2021-06-28 DIAGNOSIS — R0602 Shortness of breath: Secondary | ICD-10-CM

## 2021-06-28 DIAGNOSIS — E538 Deficiency of other specified B group vitamins: Secondary | ICD-10-CM | POA: Diagnosis present

## 2021-06-28 DIAGNOSIS — F32A Depression, unspecified: Secondary | ICD-10-CM

## 2021-06-28 DIAGNOSIS — E1151 Type 2 diabetes mellitus with diabetic peripheral angiopathy without gangrene: Secondary | ICD-10-CM | POA: Diagnosis present

## 2021-06-28 DIAGNOSIS — E785 Hyperlipidemia, unspecified: Secondary | ICD-10-CM | POA: Diagnosis present

## 2021-06-28 DIAGNOSIS — J189 Pneumonia, unspecified organism: Secondary | ICD-10-CM

## 2021-06-28 DIAGNOSIS — R1312 Dysphagia, oropharyngeal phase: Secondary | ICD-10-CM | POA: Diagnosis present

## 2021-06-28 DIAGNOSIS — I5032 Chronic diastolic (congestive) heart failure: Secondary | ICD-10-CM | POA: Diagnosis present

## 2021-06-28 DIAGNOSIS — I2510 Atherosclerotic heart disease of native coronary artery without angina pectoris: Secondary | ICD-10-CM | POA: Diagnosis present

## 2021-06-28 DIAGNOSIS — Z720 Tobacco use: Secondary | ICD-10-CM | POA: Diagnosis present

## 2021-06-28 DIAGNOSIS — I6300 Cerebral infarction due to thrombosis of unspecified precerebral artery: Secondary | ICD-10-CM

## 2021-06-28 DIAGNOSIS — I639 Cerebral infarction, unspecified: Secondary | ICD-10-CM | POA: Diagnosis not present

## 2021-06-28 DIAGNOSIS — Z951 Presence of aortocoronary bypass graft: Secondary | ICD-10-CM

## 2021-06-28 DIAGNOSIS — M21372 Foot drop, left foot: Secondary | ICD-10-CM | POA: Diagnosis present

## 2021-06-28 DIAGNOSIS — M25512 Pain in left shoulder: Secondary | ICD-10-CM | POA: Diagnosis present

## 2021-06-28 LAB — BASIC METABOLIC PANEL
Anion gap: 8 (ref 5–15)
BUN: 13 mg/dL (ref 6–20)
CO2: 31 mmol/L (ref 22–32)
Calcium: 9.4 mg/dL (ref 8.9–10.3)
Chloride: 102 mmol/L (ref 98–111)
Creatinine, Ser: 0.57 mg/dL (ref 0.44–1.00)
GFR, Estimated: >60 mL/min (ref >60)
Glucose, Bld: 83 mg/dL (ref 70–99)
Potassium: 3.2 mmol/L — ABNORMAL LOW (ref 3.5–5.1)
Sodium: 141 mmol/L (ref 135–145)

## 2021-06-28 LAB — GLUCOSE, CAPILLARY
Glucose-Capillary: 72 mg/dL (ref 70–99)
Glucose-Capillary: 179 mg/dL — ABNORMAL HIGH (ref 70–99)
Glucose-Capillary: 216 mg/dL — ABNORMAL HIGH (ref 70–99)

## 2021-06-28 LAB — CBC
HCT: 36.3 % (ref 36.0–46.0)
Hemoglobin: 12.3 g/dL (ref 12.0–15.0)
MCH: 30.4 pg (ref 26.0–34.0)
MCHC: 33.9 g/dL (ref 30.0–36.0)
MCV: 89.9 fL (ref 80.0–100.0)
Platelets: 312 10*3/uL (ref 150–400)
RBC: 4.04 MIL/uL (ref 3.87–5.11)
RDW: 14.2 % (ref 11.5–15.5)
WBC: 10.3 10*3/uL (ref 4.0–10.5)
nRBC: 0 % (ref 0.0–0.2)

## 2021-06-28 LAB — LEGIONELLA PNEUMOPHILA SEROGP 1 UR AG: L. pneumophila Serogp 1 Ur Ag: NEGATIVE

## 2021-06-28 LAB — HIV ANTIBODY (ROUTINE TESTING W REFLEX): HIV Screen 4th Generation wRfx: NONREACTIVE

## 2021-06-28 LAB — TSH: TSH: 1.95 u[IU]/mL (ref 0.350–4.500)

## 2021-06-28 LAB — MAGNESIUM: Magnesium: 2 mg/dL (ref 1.7–2.4)

## 2021-06-28 LAB — VITAMIN B12: Vitamin B-12: 169 pg/mL — ABNORMAL LOW (ref 180–914)

## 2021-06-28 MED ORDER — POLYETHYLENE GLYCOL 3350 17 G PO PACK
17.0000 g | PACK | Freq: Every day | ORAL | Status: DC | PRN
  Administered 2021-07-01: 17 g via ORAL
  Filled 2021-06-28: qty 1

## 2021-06-28 MED ORDER — BENZTROPINE MESYLATE 0.5 MG PO TABS
0.5000 mg | ORAL_TABLET | Freq: Every day | ORAL | Status: DC
  Administered 2021-06-28 – 2021-07-31 (×34): 0.5 mg via ORAL
  Filled 2021-06-28 (×35): qty 1

## 2021-06-28 MED ORDER — TRAZODONE HCL 50 MG PO TABS: 25.0000 mg | ORAL_TABLET | Freq: Every evening | ORAL | Status: DC | PRN

## 2021-06-28 MED ORDER — ISOSORBIDE MONONITRATE ER 30 MG PO TB24
60.0000 mg | ORAL_TABLET | Freq: Two times a day (BID) | ORAL | Status: DC
  Administered 2021-06-28 – 2021-08-01 (×68): 60 mg via ORAL
  Filled 2021-06-28 (×69): qty 2

## 2021-06-28 MED ORDER — LAMOTRIGINE 100 MG PO TABS
200.0000 mg | ORAL_TABLET | Freq: Every day | ORAL | Status: DC
  Administered 2021-06-28 – 2021-07-31 (×34): 200 mg via ORAL
  Filled 2021-06-28 (×36): qty 2

## 2021-06-28 MED ORDER — INSULIN GLARGINE 100 UNIT/ML ~~LOC~~ SOLN
45.0000 [IU] | Freq: Two times a day (BID) | SUBCUTANEOUS | Status: DC
  Administered 2021-06-28 – 2021-07-01 (×6): 45 [IU] via SUBCUTANEOUS
  Filled 2021-06-28 (×8): qty 0.45

## 2021-06-28 MED ORDER — TRAZODONE HCL 50 MG PO TABS
50.0000 mg | ORAL_TABLET | Freq: Every day | ORAL | Status: DC
  Administered 2021-06-28 – 2021-07-31 (×32): 50 mg via ORAL
  Filled 2021-06-28 (×32): qty 1

## 2021-06-28 MED ORDER — SODIUM CHLORIDE 0.9 % IV SOLN
3.0000 g | Freq: Four times a day (QID) | INTRAVENOUS | Status: DC
  Administered 2021-06-28 – 2021-07-02 (×16): 3 g via INTRAVENOUS
  Filled 2021-06-28 (×2): qty 3
  Filled 2021-06-28: qty 8
  Filled 2021-06-28 (×3): qty 3
  Filled 2021-06-28: qty 8
  Filled 2021-06-28 (×10): qty 3

## 2021-06-28 MED ORDER — CHLORPROMAZINE HCL 100 MG PO TABS
100.0000 mg | ORAL_TABLET | Freq: Every day | ORAL | Status: DC
  Administered 2021-06-28 – 2021-07-31 (×34): 100 mg via ORAL
  Filled 2021-06-28 (×34): qty 1

## 2021-06-28 MED ORDER — BASAGLAR KWIKPEN 100 UNIT/ML ~~LOC~~ SOPN: 40.0000 [IU] | PEN_INJECTOR | Freq: Two times a day (BID) | SUBCUTANEOUS | 3 refills | Status: DC

## 2021-06-28 MED ORDER — GUAIFENESIN-DM 100-10 MG/5ML PO SYRP: 5.0000 mL | ORAL_SOLUTION | Freq: Four times a day (QID) | ORAL | Status: DC | PRN

## 2021-06-28 MED ORDER — ALUM & MAG HYDROXIDE-SIMETH 200-200-20 MG/5ML PO SUSP
30.0000 mL | ORAL | Status: DC | PRN
  Administered 2021-06-29: 30 mL via ORAL
  Filled 2021-06-28: qty 30

## 2021-06-28 MED ORDER — PROCHLORPERAZINE 25 MG RE SUPP: 12.5000 mg | Freq: Four times a day (QID) | RECTAL | Status: DC | PRN

## 2021-06-28 MED ORDER — TORSEMIDE 20 MG PO TABS: 20.0000 mg | ORAL_TABLET | Freq: Every day | ORAL | 5 refills | Status: DC

## 2021-06-28 MED ORDER — EZETIMIBE 10 MG PO TABS
10.0000 mg | ORAL_TABLET | Freq: Every day | ORAL | Status: DC
  Administered 2021-06-29 – 2021-08-01 (×34): 10 mg via ORAL
  Filled 2021-06-28 (×34): qty 1

## 2021-06-28 MED ORDER — CLONIDINE HCL 0.1 MG PO TABS: ORAL_TABLET | ORAL | 0 refills | Status: DC

## 2021-06-28 MED ORDER — FLUOXETINE HCL 20 MG PO CAPS
60.0000 mg | ORAL_CAPSULE | Freq: Every day | ORAL | Status: DC
  Administered 2021-06-29 – 2021-08-01 (×34): 60 mg via ORAL
  Filled 2021-06-28 (×36): qty 3

## 2021-06-28 MED ORDER — BISACODYL 10 MG RE SUPP
10.0000 mg | Freq: Every day | RECTAL | Status: DC | PRN
  Administered 2021-07-03 – 2021-07-15 (×2): 10 mg via RECTAL
  Filled 2021-06-28 (×2): qty 1

## 2021-06-28 MED ORDER — PREGABALIN 75 MG PO CAPS
225.0000 mg | ORAL_CAPSULE | Freq: Two times a day (BID) | ORAL | Status: DC
  Administered 2021-06-28 – 2021-08-01 (×68): 225 mg via ORAL
  Filled 2021-06-28 (×68): qty 3

## 2021-06-28 MED ORDER — NITROGLYCERIN 0.4 MG SL SUBL: 0.4000 mg | SUBLINGUAL_TABLET | SUBLINGUAL | Status: DC | PRN

## 2021-06-28 MED ORDER — ALBUTEROL SULFATE (2.5 MG/3ML) 0.083% IN NEBU
3.0000 mL | INHALATION_SOLUTION | RESPIRATORY_TRACT | Status: DC | PRN
  Administered 2021-07-02: 3 mL via RESPIRATORY_TRACT
  Filled 2021-06-28: qty 3

## 2021-06-28 MED ORDER — PROCHLORPERAZINE EDISYLATE 10 MG/2ML IJ SOLN: 5.0000 mg | Freq: Four times a day (QID) | INTRAMUSCULAR | Status: DC | PRN

## 2021-06-28 MED ORDER — DM-GUAIFENESIN ER 30-600 MG PO TB12
1.0000 | ORAL_TABLET | Freq: Two times a day (BID) | ORAL | Status: DC
  Administered 2021-06-28 – 2021-07-05 (×14): 1 via ORAL
  Filled 2021-06-28 (×14): qty 1

## 2021-06-28 MED ORDER — INSULIN ASPART 100 UNIT/ML IJ SOLN
0.0000 [IU] | Freq: Three times a day (TID) | INTRAMUSCULAR | Status: DC
  Administered 2021-06-28: 2 [IU] via SUBCUTANEOUS
  Administered 2021-06-29 – 2021-06-30 (×2): 1 [IU] via SUBCUTANEOUS
  Administered 2021-06-30: 2 [IU] via SUBCUTANEOUS
  Administered 2021-07-01: 3 [IU] via SUBCUTANEOUS
  Administered 2021-07-02: 2 [IU] via SUBCUTANEOUS
  Administered 2021-07-04: 1 [IU] via SUBCUTANEOUS
  Administered 2021-07-05: 2 [IU] via SUBCUTANEOUS
  Administered 2021-07-07 – 2021-07-08 (×3): 1 [IU] via SUBCUTANEOUS
  Administered 2021-07-08: 3 [IU] via SUBCUTANEOUS
  Administered 2021-07-09 – 2021-07-11 (×2): 1 [IU] via SUBCUTANEOUS
  Administered 2021-07-12 (×2): 2 [IU] via SUBCUTANEOUS
  Administered 2021-07-13: 1 [IU] via SUBCUTANEOUS
  Administered 2021-07-13: 2 [IU] via SUBCUTANEOUS
  Administered 2021-07-14: 1 [IU] via SUBCUTANEOUS
  Administered 2021-07-15: 2 [IU] via SUBCUTANEOUS
  Administered 2021-07-15 – 2021-07-16 (×3): 1 [IU] via SUBCUTANEOUS
  Administered 2021-07-17 – 2021-07-18 (×3): 2 [IU] via SUBCUTANEOUS
  Administered 2021-07-18 – 2021-07-19 (×2): 1 [IU] via SUBCUTANEOUS
  Administered 2021-07-19 – 2021-07-20 (×3): 3 [IU] via SUBCUTANEOUS
  Administered 2021-07-20 – 2021-07-21 (×2): 1 [IU] via SUBCUTANEOUS
  Administered 2021-07-21: 3 [IU] via SUBCUTANEOUS
  Administered 2021-07-22 (×2): 2 [IU] via SUBCUTANEOUS
  Administered 2021-07-23: 3 [IU] via SUBCUTANEOUS
  Administered 2021-07-23: 1 [IU] via SUBCUTANEOUS
  Administered 2021-07-24: 3 [IU] via SUBCUTANEOUS
  Administered 2021-07-25: 5 [IU] via SUBCUTANEOUS
  Administered 2021-07-26: 1 [IU] via SUBCUTANEOUS
  Administered 2021-07-27: 2 [IU] via SUBCUTANEOUS
  Administered 2021-07-27: 3 [IU] via SUBCUTANEOUS
  Administered 2021-07-28: 1 [IU] via SUBCUTANEOUS
  Administered 2021-07-28 – 2021-07-29 (×2): 2 [IU] via SUBCUTANEOUS
  Administered 2021-07-30: 3 [IU] via SUBCUTANEOUS
  Administered 2021-07-30 – 2021-07-31 (×2): 1 [IU] via SUBCUTANEOUS

## 2021-06-28 MED ORDER — DIPHENHYDRAMINE HCL 12.5 MG/5ML PO ELIX: 12.5000 mg | ORAL_SOLUTION | Freq: Four times a day (QID) | ORAL | Status: DC | PRN

## 2021-06-28 MED ORDER — ENOXAPARIN SODIUM 40 MG/0.4ML IJ SOSY
40.0000 mg | PREFILLED_SYRINGE | INTRAMUSCULAR | Status: DC
  Administered 2021-06-28 – 2021-07-31 (×34): 40 mg via SUBCUTANEOUS
  Filled 2021-06-28 (×36): qty 0.4

## 2021-06-28 MED ORDER — CYANOCOBALAMIN 1000 MCG PO TABS: 1000.0000 ug | ORAL_TABLET | Freq: Every day | ORAL | Status: DC

## 2021-06-28 MED ORDER — CLOPIDOGREL BISULFATE 75 MG PO TABS
75.0000 mg | ORAL_TABLET | Freq: Every day | ORAL | Status: DC
  Administered 2021-06-29 – 2021-08-01 (×34): 75 mg via ORAL
  Filled 2021-06-28 (×34): qty 1

## 2021-06-28 MED ORDER — SPIRONOLACTONE 25 MG PO TABS
25.0000 mg | ORAL_TABLET | Freq: Every day | ORAL | Status: DC
  Administered 2021-06-29 – 2021-08-01 (×34): 25 mg via ORAL
  Filled 2021-06-28 (×35): qty 1

## 2021-06-28 MED ORDER — VITAMIN B-12 1000 MCG PO TABS
1000.0000 ug | ORAL_TABLET | Freq: Every day | ORAL | Status: DC
  Administered 2021-06-29 – 2021-06-30 (×2): 1000 ug via ORAL
  Filled 2021-06-28 (×2): qty 1

## 2021-06-28 MED ORDER — ACETAMINOPHEN 325 MG PO TABS
325.0000 mg | ORAL_TABLET | ORAL | Status: DC | PRN
Start: 2021-06-28 — End: 2021-08-01
  Administered 2021-06-28 – 2021-07-18 (×6): 650 mg via ORAL
  Filled 2021-06-28 (×6): qty 2

## 2021-06-28 MED ORDER — OXYCODONE HCL 5 MG PO TABS
5.0000 mg | ORAL_TABLET | Freq: Four times a day (QID) | ORAL | Status: DC | PRN
  Administered 2021-06-28 – 2021-06-30 (×6): 5 mg via ORAL
  Filled 2021-06-28 (×6): qty 1

## 2021-06-28 MED ORDER — CLONAZEPAM 0.5 MG PO TABS: 0.5000 mg | ORAL_TABLET | Freq: Three times a day (TID) | ORAL | Status: DC | PRN

## 2021-06-28 MED ORDER — THIAMINE HCL 100 MG PO TABS
100.0000 mg | ORAL_TABLET | Freq: Every day | ORAL | Status: DC
  Administered 2021-06-29 – 2021-08-01 (×34): 100 mg via ORAL
  Filled 2021-06-28 (×34): qty 1

## 2021-06-28 MED ORDER — ASPIRIN 81 MG PO CHEW
81.0000 mg | CHEWABLE_TABLET | Freq: Every day | ORAL | Status: DC
  Administered 2021-06-29 – 2021-08-01 (×34): 81 mg via ORAL
  Filled 2021-06-28 (×34): qty 1

## 2021-06-28 MED ORDER — FLEET ENEMA 7-19 GM/118ML RE ENEM: 1.0000 | ENEMA | Freq: Once | RECTAL | Status: DC | PRN

## 2021-06-28 MED ORDER — ROSUVASTATIN CALCIUM 20 MG PO TABS
20.0000 mg | ORAL_TABLET | Freq: Every day | ORAL | Status: DC
  Administered 2021-06-29 – 2021-08-01 (×34): 20 mg via ORAL
  Filled 2021-06-28 (×34): qty 1

## 2021-06-28 MED ORDER — INSULIN ASPART 100 UNIT/ML IJ SOLN
0.0000 [IU] | Freq: Every day | INTRAMUSCULAR | Status: DC
  Administered 2021-06-28 – 2021-07-04 (×4): 2 [IU] via SUBCUTANEOUS

## 2021-06-28 MED ORDER — ASPIRIN 81 MG PO CHEW
81.0000 mg | CHEWABLE_TABLET | Freq: Every day | ORAL | Status: DC
Start: 2021-06-29 — End: 2021-12-24

## 2021-06-28 MED ORDER — NICOTINE 21 MG/24HR TD PT24
21.0000 mg | MEDICATED_PATCH | Freq: Every day | TRANSDERMAL | Status: DC
  Administered 2021-06-29 – 2021-08-01 (×34): 21 mg via TRANSDERMAL
  Filled 2021-06-28 (×34): qty 1

## 2021-06-28 MED ORDER — TORSEMIDE 20 MG PO TABS
20.0000 mg | ORAL_TABLET | Freq: Every day | ORAL | Status: DC
  Administered 2021-06-29 – 2021-07-05 (×7): 20 mg via ORAL
  Filled 2021-06-28 (×8): qty 1

## 2021-06-28 MED ORDER — CARVEDILOL 25 MG PO TABS
25.0000 mg | ORAL_TABLET | Freq: Two times a day (BID) | ORAL | Status: DC
  Administered 2021-06-28 – 2021-07-01 (×7): 25 mg via ORAL
  Filled 2021-06-28 (×8): qty 1

## 2021-06-28 MED ORDER — THIAMINE HCL 100 MG/ML IJ SOLN: 100.0000 mg | Freq: Every day | INTRAMUSCULAR | Status: DC

## 2021-06-28 MED ORDER — AMLODIPINE BESYLATE 10 MG PO TABS
ORAL_TABLET | ORAL | 1 refills | Status: DC
Start: 2021-06-28 — End: 2021-08-01

## 2021-06-28 MED ORDER — LOSARTAN POTASSIUM 50 MG PO TABS
25.0000 mg | ORAL_TABLET | Freq: Every day | ORAL | Status: DC
  Administered 2021-06-29 – 2021-07-12 (×14): 25 mg via ORAL
  Filled 2021-06-28 (×14): qty 1

## 2021-06-28 MED ORDER — INSULIN ASPART 100 UNIT/ML IJ SOLN: 0.0000 [IU] | Freq: Three times a day (TID) | INTRAMUSCULAR | 11 refills | Status: DC

## 2021-06-28 MED ORDER — POTASSIUM CHLORIDE 20 MEQ PO PACK: 40.0000 meq | PACK | Freq: Once | ORAL | Status: DC

## 2021-06-28 MED ORDER — PROCHLORPERAZINE MALEATE 5 MG PO TABS: 5.0000 mg | ORAL_TABLET | Freq: Four times a day (QID) | ORAL | Status: DC | PRN

## 2021-06-28 MED ORDER — EZETIMIBE 10 MG PO TABS: 10.0000 mg | ORAL_TABLET | Freq: Every day | ORAL | Status: DC

## 2021-06-28 NOTE — Discharge Summary (Signed)
Physician Discharge Summary   Erika [name]  female DOB: Jun 04, 1971  GYQ:[id number]  PCP: Lynnda Child, MD  Admit date: 06/24/2021 Discharge date: 06/28/2021  Admitted From: home Disposition:  CIR CODE STATUS: Full code  Discharge Instructions     AMB Referral to Advanced Lipid Disorders Clinic   Complete by: As directed    Internal Lipid Clinic Referral Scheduling  Internal lipid clinic referrals are providers within [facility], who wish to refer established patients for routine management (help in starting PCSK9 inhibitor therapy) or advanced therapies.  Internal MD referral criteria:              1. All patients with LDL>190 mg/dL  2. All patients with Triglycerides >500 mg/dL  3. Patients with suspected or confirmed heterozygous familial hyperlipidemia (HeFH) or homozygous familial hyperlipidemia (HoFH)  4. Patients with family history of suspicious for genetic dyslipidemia desiring genetic testing  5. Patients refractory to standard guideline based therapy  6. Patients with statin intolerance (failed 2 statins, one of which must be a high potency statin)  7. Patients who the provider desires to be seen by MD   Internal PharmD referral criteria:   1. Follow-up patients for medication management  2. Follow-up for compliance monitoring  3. Patients for drug education  4. Patients with statin intolerance  5. PCSK9 inhibitor education and prior authorization approvals  6. Patients with triglycerides <500 mg/dL  External Lipid Clinic Referral  External lipid clinic referrals are for providers outside of [facility], considered new clinic patients - automatically routed to MD schedule        Hospital Course:  For full details, please see H&P, progress notes, consult notes and ancillary notes.  Briefly,  Erika [name] is a 50 y.o. female with medical history significant of HTN, HLD, DM, tobacco abuse, PAD, CAD, CABG, dCHF, peptic foot drop, renal artery  stenosis, carotid artery stenosis (s/p of right CEA 2017), PTSD, adjustment disorder, who presents with slurred speech, left facial droop, left arm weakness.  Acute Ischemic CVA of right pontine perforator Left-sided weakness, dysarthria Stroke risk factors: CAD, carotid stenosis, HLD, HTN, obesity, PAD, tobacco abuse and DM2 --cont Plavix + ASA, Crestor --BP goal 140 to 180 systolic --Strict glycemic control --Tobacco cessation --Neurology follow up after discharge --Pt discharged to CIR.   Essential hypertension BP typically runs upper 100's to 200's systolic at home.   BP's have been labile here w/intermittent lows. --Per Neurology - goal SBP 140-180 --home amlodipine, clonidine were not prescribed during hospitalization, and continued to be held at discharge.   --resumed losartan - decreased 100>>25 mg --resumed spironolactone --resumed torsemide 20 mg daily (was on BID) --continue Coreg 25 mg BID (was 37.5 mg)  Severe sepsis due to RUL Pneumonia  Acute respiratory failure with hypoxia  Presented with leukocytosis, tachycardia, RUL patchy density on CTA chest consistent with pneumonia. Lactici acidosis reflects organ dysfunction and severe sepsis. Started on empiric CAP coverage with Rocephin/Doxy on admission. Sepsis physiology and lactic acidosis resolved. --ceftriaxone and doxy then to Unasyn, finished 5 days prior to discharge.   Fall due to weakness from CVA -PT/OT - CIR recommended   Cluster headaches  - 100% O2 by NRB mask PRN   HLD  - Crestor.   On Repatha q14days, last dose on 6/26   ADJUSTMENT DISORDER WITH MIXED FEATURES MDD (major depressive disorder): -Cogentin, Thorazine (pts own med), Klonopin, Lamictal   Elevated troponin due to demand ischemia Hx of CAD: s/p of CABG.  No chest pain, likely due to demand ischemia [facility] cardiology consulted - Crestor, plavix, ASA started per neuro   Tobacco abuse  - Nicotine patch   Type 2 diabetes mellitus with  other specified complication  - Recent Q4O 11.5, poorly controlled.  Patient is taking Trulicity, Farxiga, glargine insulin   PVD  - On Crestor and Plavix   Chronic diastolic CHF  - currently compensated. 2D echo on 06/27/2016 showed EF of 50-55% with grade 1 diastolic dysfunction.   No pulmonary edema on chest x-ray.  BNP 33.3, --resumed spironolactone --resumed torsemide 20 mg daily (was on BID)   Abnormal LFTs: mild -hepatis panel negative   Patient BMI: Body mass index is 29.13 kg/m.    Discharge Diagnoses:  Principal Problem:   Stroke [facility]) Active Problems:   HLD (hyperlipidemia)   ADJUSTMENT DISORDER WITH MIXED FEATURES   MDD (major depressive disorder)   Elevated troponin   Tobacco abuse   Essential hypertension   Type 2 diabetes mellitus with other specified complication (HCC)   CAD (coronary artery disease)   Severe sepsis (HCC)   PVD (peripheral vascular disease) (HCC)   Chronic diastolic CHF (congestive heart failure) (HCC)   Fall   Abnormal LFTs   Acute respiratory failure with hypoxia ([place])   30 Day Unplanned Readmission Risk Score    Flowsheet Row ED to Hosp-Admission (Current) from 06/24/2021 in [facility]  30 Day Unplanned Readmission Risk Score (%) 22.19 Filed at 06/28/2021 0801       This score is the patient's risk of an unplanned readmission within 30 days of being discharged (0 -100%). The score is based on dignosis, age, lab data, medications, orders, and past utilization.   Low:  0-14.9   Medium: 15-21.9   High: 22-29.9   Extreme: 30 and above          Discharge Instructions:  Allergies as of 06/28/2021       Reactions   Chantix [varenicline Tartrate] Other (See Comments)   Feels "crazy" and angry        Medication List     STOP taking these medications    insulin lispro 100 UNIT/ML KwikPen Commonly known as: HumaLOG KwikPen       TAKE these medications    albuterol 108 (90 Base) MCG/ACT  inhaler Commonly known as: VENTOLIN HFA Inhale 2 puffs into the lungs every 6 (six) hours as needed for wheezing.   amLODipine 10 MG tablet Commonly known as: NORVASC Hold until outpatient followup since your blood pressure was normal without this. What changed:  how much to take how to take this when to take this additional instructions   aspirin 81 MG chewable tablet Chew 1 tablet (81 mg total) by mouth daily. Start taking on: June 29, 2021   Basaglar KwikPen 100 UNIT/ML Inject 40 Units into the skin 2 (two) times daily. What changed:  how much to take when to take this additional instructions   benztropine 0.5 MG tablet Commonly known as: COGENTIN Take 1 tablet (0.5 mg total) by mouth at bedtime.   carvedilol 25 MG tablet Commonly known as: COREG Take 1.5 tablets (37.5 mg total) by mouth 2 (two) times daily with a meal.   chlorproMAZINE 100 MG tablet Commonly known as: THORAZINE TAKE 1 TABLET BY MOUTH EVERYDAY AT BEDTIME   clonazePAM 0.5 MG tablet Commonly known as: KlonoPIN Take 1 tablet (0.5 mg total) by mouth 3 (three) times daily as needed for anxiety.  cloNIDine 0.1 MG tablet Commonly known as: CATAPRES Hold until outpatient followup since your blood pressure was normal without this. What changed:  how much to take how to take this when to take this additional instructions   clopidogrel 75 MG tablet Commonly known as: PLAVIX TAKE 1 TABLET BY MOUTH EVERY DAY   cyanocobalamin 1000 MCG tablet Take 1 tablet (1,000 mcg total) by mouth daily. Start taking on: June 29, 2021   dapagliflozin propanediol 10 MG Tabs tablet Commonly known as: Wilder Glade Take 1 tablet (10 mg total) by mouth daily. Pt needs apt with PCP for further refills   ezetimibe 10 MG tablet Commonly known as: ZETIA Take 1 tablet (10 mg total) by mouth daily. Start taking on: June 29, 2021   FLUoxetine HCl 60 MG Tabs Take 60 mg by mouth daily.   insulin aspart 100 UNIT/ML  injection Commonly known as: novoLOG Inject 0-9 Units into the skin 3 (three) times daily with meals. CBG 70 - 120: 0 units CBG 121 - 150: 1 unit CBG 151 - 200: 2 units CBG 201 - 250: 3 units CBG 251 - 300: 5 units CBG 301 - 350: 7 units CBG 351 - 400: 9 units CBG > 400: call MD   Insulin Syringe-Needle U-100 29G X 1/2" 0.3 ML Misc 1 each by Does not apply route 2 (two) times daily.   isosorbide mononitrate 60 MG 24 hr tablet Commonly known as: IMDUR TAKE ONE TABLET BY MOUTH EVERY MORNING AND ONE-HALF EVERY EVENING   lamoTRIgine 200 MG tablet Commonly known as: LAMICTAL Take 1 tablet (200 mg total) by mouth at bedtime.   losartan 100 MG tablet Commonly known as: COZAAR TAKE ONE TABLET BY MOUTH ONE TIME DAILY   naloxone 4 MG/0.1ML Liqd nasal spray kit Commonly known as: NARCAN Place 1 spray into the nose as needed for up to 365 doses (for opioid-induced respiratory depresssion). In case of emergency (overdose), spray once into each nostril. If no response within 3 minutes, repeat application and call 161.   nicotine 14 mg/24hr patch Commonly known as: NICODERM CQ - dosed in mg/24 hours Place 14 mg onto the skin daily.   nitroGLYCERIN 0.4 MG SL tablet Commonly known as: NITROSTAT Place 1 tablet (0.4 mg total) under the tongue every 5 (five) minutes as needed for chest pain.   ondansetron 4 MG tablet Commonly known as: ZOFRAN Take 1 tablet (4 mg total) by mouth daily as needed for nausea or vomiting.   oxyCODONE 5 MG immediate release tablet Commonly known as: Oxy IR/ROXICODONE Take 1 tablet (5 mg total) by mouth 2 (two) times daily as needed for severe pain. Must last 30 days. What changed: Another medication with the same name was removed. Continue taking this medication, and follow the directions you see here.   pregabalin 225 MG capsule Commonly known as: Lyrica Take 1 capsule (225 mg total) by mouth 2 (two) times daily.   Repatha SureClick 096 MG/ML Soaj Generic drug:  Evolocumab INJECT 1 PEN INTO THE SKIN EVERY 14 (FOURTEEN) DAYS.   rosuvastatin 20 MG tablet Commonly known as: CRESTOR Take 1 tablet (20 mg total) by mouth daily.   spironolactone 25 MG tablet Commonly known as: ALDACTONE Take 1 tablet (25 mg total) by mouth daily.   torsemide 20 MG tablet Commonly known as: DEMADEX Take 1 tablet (20 mg total) by mouth daily. Decreased from twice a day. What changed:  when to take this additional instructions   Trulicity 0.45 WU/9.8JX Sopn  Generic drug: Dulaglutide Inject 0.75 mg into the skin once a week.          Allergies  Allergen Reactions   Chantix [Varenicline Tartrate] Other (See Comments)    Feels "crazy" and angry      The results of significant diagnostics from this hospitalization (including imaging, microbiology, ancillary and laboratory) are listed below for reference.   Consultations:   Procedures/Studies: CT Angio Chest Pulmonary Embolism (PE) W or WO Contrast  Result Date: 06/24/2021 CLINICAL DATA:  Evaluate for pulmonary embolism. History of code stroke. EXAM: CT ANGIOGRAPHY CHEST WITH CONTRAST TECHNIQUE: Multidetector CT imaging of the chest was performed using the standard protocol during bolus administration of intravenous contrast. Multiplanar CT image reconstructions and MIPs were obtained to evaluate the vascular anatomy. CONTRAST:  17mL OMNIPAQUE IOHEXOL 350 MG/ML SOLN COMPARISON:  Chest CT 06/28/2016 FINDINGS: Cardiovascular: Suboptimal evaluation of the pulmonary arteries. Large amount of contrast is already in the aorta. Bilateral main pulmonary arteries are patent but cannot evaluate beyond the main pulmonary arteries. Atherosclerotic disease in the thoracic aorta, post CABG. Calcifications in the native coronary arteries. Great vessels are patent. Atherosclerotic plaque involving the right common carotid artery and innominate artery. Normal caliber of the thoracic aorta. Mediastinum/Nodes: Postsurgical changes  from median sternotomy and CABG. No significant lymph node enlargement. Lungs/Pleura: Trachea and mainstem bronchi are patent. No large pleural effusions. Patchy densities in the right upper lobe. 5 mm nodule in the superior segment of the right lower lobe has minimally changed since 2017 and likely benign. zPeripheral densities in the posterior right lower lobe are most compatible with atelectasis. Peripheral nodule in the posterior left lower lobe on sequence 6 image 49 appears stable. Upper Abdomen: Diffuse low density of the liver is compatible with steatosis. Musculoskeletal: No acute bone abnormality. Review of the MIP images confirms the above findings. IMPRESSION: 1. Suboptimal evaluation for pulmonary embolism. Pulmonary emboli cannot be confidently evaluated on this examination. 2. Patchy densities in the right upper lobe. Findings are nonspecific and could be related to areas of atelectasis versus mild infection. 3. Nodule in the superior segment of the right lower lobe has minimally changed since 2017 and likely benign. 4. Hepatic steatosis. 5.  Aortic Atherosclerosis (ICD10-I70.0). Electronically Signed   By: Markus Daft M.D.   On: 06/24/2021 10:55   MR BRAIN WO CONTRAST  Result Date: 06/27/2021 CLINICAL DATA:  Neuro deficit, acute, stroke suspected. EXAM: MRI HEAD WITHOUT CONTRAST TECHNIQUE: Multiplanar, multiecho pulse sequences of the brain and surrounding structures were obtained without intravenous contrast. COMPARISON:  MRI of the brain June 24, 2021. FINDINGS: Brain: Area of restricted diffusion involving the right side of the pons, consistent with subacute infarct, unchanged from prior MRI. No new focus of restricted diffusion to suggest new acute infarct. No hemorrhage, hydrocephalus, extra-axial collection or mass lesion. Area of encephalomalacia and gliosis in the right parietal and occipital lobes with associated cortical laminar necrosis, unchanged. Scattered foci of T2 hyperintensity are  seen within the white matter of the cerebral hemispheres, nonspecific. Vascular: Normal flow voids. Skull and upper cervical spine: Normal marrow signal. Sinuses/Orbits: Negative. Other: None. IMPRESSION: 1. No new acute infarct since prior MRI. 2. Subacute infarct on the right side of the pons, similar to prior MRI. 3. Area of encephalomalacia and gliosis in the right parietooccipital region with associated cortical laminar necrosis, unchanged. 4. Small amount of nonspecific T2 hyperintense lesions of the white matter. Differential diagnosis include chronic microangiopathy, vasculitis and post inflammatory/infectious processes. Electronically Signed  By: Pedro Earls M.D.   On: 06/27/2021 12:47   MR BRAIN WO CONTRAST  Result Date: 06/24/2021 CLINICAL DATA:  Slurred speech, left facial droop, left arm weakness, code stroke follow-up EXAM: MRI HEAD WITHOUT CONTRAST TECHNIQUE: Multiplanar, multiecho pulse sequences of the brain and surrounding structures were obtained without intravenous contrast. COMPARISON:  None. FINDINGS: Brain: There is reduced diffusion in the parasagittal right pons. The right parietal and occipital infarcts identified on prior CT imaging are chronic. There is associated T1 shortening reflecting laminar necrosis. This accounts for the hyperdensity seen on CT rather than hemorrhage. There is no intracranial mass or mass effect. Additional patchy foci of T2 hyperintensity in the supratentorial and pontine white matter are nonspecific but may reflect mild chronic microvascular ischemic changes. There are a few scattered foci of susceptibility hypointensity in the cerebral white matter, left basal ganglia, and right thalamus compatible with chronic microhemorrhages. There is no hydrocephalus or extra-axial fluid collection. Vascular: Major vessel flow voids at the skull base are preserved. Skull and upper cervical spine: Normal marrow signal is preserved. Sinuses/Orbits:  Paranasal sinuses are aerated. Orbits are unremarkable. Other: Sella is unremarkable.  Mastoid air cells are clear. IMPRESSION: Acute right pontine perforator infarct. Right parietal and occipital infarcts are chronic. Mild chronic microvascular ischemic changes. Mild burden of scattered chronic microhemorrhages. Electronically Signed   By: Macy Mis M.D.   On: 06/24/2021 14:56   CT CEREBRAL PERFUSION W CONTRAST  Result Date: 06/24/2021 CLINICAL DATA:  Slurred speech, left facial droop, left arm weakness, history of right CEA 2017 EXAM: CT ANGIOGRAPHY HEAD AND NECK CT PERFUSION BRAIN TECHNIQUE: Multidetector CT imaging of the head and neck was performed using the standard protocol during bolus administration of intravenous contrast. Multiplanar CT image reconstructions and MIPs were obtained to evaluate the vascular anatomy. Carotid stenosis measurements (when applicable) are obtained utilizing NASCET criteria, using the distal internal carotid diameter as the denominator. Multiphase CT imaging of the brain was performed following IV bolus contrast injection. Subsequent parametric perfusion maps were calculated using RAPID software. CONTRAST:  173mL OMNIPAQUE IOHEXOL 350 MG/ML SOLN COMPARISON:  CT head earlier same day, CTA 04/08/2020 FINDINGS: CT HEAD FINDINGS Brain: Stable appearance of areas of hypoattenuation and loss of gray-white differentiation in the right parietal lobe and right occipital lobe. As before, there is possible petechial hemorrhage associated with the occipital infarct. No new hemorrhage or mass effect. Ventricles are stable in size. Vascular: No new findings. Skull: No new findings. Sinuses/Orbits: No new findings. Other: None. CTA NECK FINDINGS Aortic arch: Stable appearance of plaque at the great vessel origins. There is direct origin of the left vertebral artery from the arch. Right carotid system: Circumferential noncalcified plaque along the common carotid is decreased. Decreased  plaque at the bifurcation and proximal ICA with improved stenosis post endarterectomy. Left carotid system: Patent. Mixed plaque along the common carotid, bifurcation, and proximal internal carotid. Unchanged 50% stenosis of the proximal ICA. Vertebral arteries: Right vertebral artery is patent. There is unchanged severe stenosis at the origin of the left vertebral with minimal opacification of the P2 segment. Reconstitution is present at the C2-C3 level. Skeleton: Degenerative changes of the cervical spine. Other neck: No acute abnormality. Upper chest: No apical lung mass. Review of the MIP images confirms the above findings CTA HEAD FINDINGS Anterior circulation: Intracranial internal carotid arteries are patent with mixed plaque causing moderate stenosis. Anterior and middle cerebral arteries are patent. Posterior circulation: Intracranial vertebral arteries are patent with  diffuse atherosclerotic irregularity. Left vertebral artery is no longer well seen beyond PICA origin. Basilar artery is patent. Posterior cerebral arteries are patent. Bilateral posterior communicating arteries are present. Venous sinuses: As permitted by contrast timing, patent. Review of the MIP images confirms the above findings CT Brain Perfusion Findings: CBF (<30%) Volume: 36mL Perfusion (Tmax>6.0s) volume: 90mL Mismatch Volume: 59mL Infarction Location: None. IMPRESSION: Stable appearance of the brain.  No new intracranial hemorrhage. Perfusion imaging demonstrates no evidence of core infarction or penumbra. Less stringent criteria demonstrates elevation of T-max corresponding to right occipital infarct, which is probably subacute with petechial hemorrhage. The right parietal infarct is more likely subacute to chronic. Decreased stenosis and plaque of the distal right common carotid and proximal right internal carotid reflecting interval endarterectomy. Intracranial left vertebral artery is now no longer well seen beyond the PICA origin.  Remains high-grade stenosis of the left vertebral origin with reconstitution distally in the neck. Remain significant atherosclerotic irregularity of the proximal left intracranial portion with high-grade stenosis likely accounting for loss of distal flow. Electronically Signed   By: Macy Mis M.D.   On: 06/24/2021 13:06   DG Chest Port 1 View  Result Date: 06/24/2021 CLINICAL DATA:  Code stroke. EXAM: PORTABLE CHEST 1 VIEW COMPARISON:  May 25 21 FINDINGS: Is 631 hours. Low lung volumes. Basilar atelectasis without focal consolidation or overt pulmonary edema. Interstitial markings are diffusely coarsened with chronic features. The cardiopericardial silhouette is within normal limits for size. Status post CABG. Telemetry leads overlie the chest. IMPRESSION: Low volume chest with basilar atelectasis. Electronically Signed   By: Misty Stanley M.D.   On: 06/24/2021 07:28   ECHOCARDIOGRAM COMPLETE  Result Date: 06/25/2021    ECHOCARDIOGRAM REPORT   Patient Name:   Erika [name] Date of Exam: 06/25/2021 Medical Rec #:  [id number]           Height:       67.0 in Accession #:    [id number]          Weight:       186.0 lb Date of Birth:  1971-09-23          BSA:          1.961 m Patient Age:    68 years            BP:           152/74 mmHg Patient Gender: F                   HR:           78 bpm. Exam Location:  [facility] Procedure: 2D Echo Indications:     Stroke  History:         Patient has prior history of Echocardiogram examinations. CAD                  and Previous Myocardial Infarction; Risk Factors:Diabetes and                  Hypertension.  Sonographer:     L Thornton-Maynard Referring Phys:  [id number] Soledad Gerlach NIU Diagnosing Phys: Ida Rogue MD IMPRESSIONS  1. Left ventricular ejection fraction, by estimation, is 60 to 65%. The left ventricle has normal function. The left ventricle has no regional wall motion abnormalities. Left ventricular diastolic parameters were normal.  2. Right ventricular systolic  function is normal. The right ventricular size is normal. Tricuspid regurgitation signal is inadequate for assessing PA pressure.  3.  The mitral valve is normal in structure. No evidence of mitral valve regurgitation. No evidence of mitral stenosis.  4. The aortic valve is normal in structure. Aortic valve regurgitation is not visualized. No aortic stenosis is present.  5. The inferior vena cava is normal in size with greater than 50% respiratory variability, suggesting right atrial pressure of 3 mmHg. FINDINGS  Left Ventricle: Left ventricular ejection fraction, by estimation, is 60 to 65%. The left ventricle has normal function. The left ventricle has no regional wall motion abnormalities. The left ventricular internal cavity size was normal in size. There is  no left ventricular hypertrophy. Left ventricular diastolic parameters were normal. Right Ventricle: The right ventricular size is normal. No increase in right ventricular wall thickness. Right ventricular systolic function is normal. Tricuspid regurgitation signal is inadequate for assessing PA pressure. Left Atrium: Left atrial size was normal in size. Right Atrium: Right atrial size was normal in size. Pericardium: There is no evidence of pericardial effusion. Mitral Valve: The mitral valve is normal in structure. No evidence of mitral valve regurgitation. No evidence of mitral valve stenosis. Tricuspid Valve: The tricuspid valve is normal in structure. Tricuspid valve regurgitation is not demonstrated. No evidence of tricuspid stenosis. Aortic Valve: The aortic valve is normal in structure. Aortic valve regurgitation is not visualized. No aortic stenosis is present. Aortic valve mean gradient measures 4.0 mmHg. Aortic valve peak gradient measures 7.5 mmHg. Aortic valve area, by VTI measures 2.57 cm. Pulmonic Valve: The pulmonic valve was normal in structure. Pulmonic valve regurgitation is not visualized. No evidence of pulmonic stenosis. Aorta: The  aortic root is normal in size and structure. Venous: The inferior vena cava is normal in size with greater than 50% respiratory variability, suggesting right atrial pressure of 3 mmHg. IAS/Shunts: No atrial level shunt detected by color flow Doppler.  LEFT VENTRICLE PLAX 2D LVIDd:         3.88 cm  Diastology LVIDs:         2.21 cm  LV e' medial:    4.35 cm/s LV PW:         1.51 cm  LV E/e' medial:  18.5 LV IVS:        1.22 cm  LV e' lateral:   6.31 cm/s LVOT diam:     2.00 cm  LV E/e' lateral: 12.8 LV SV:         65 LV SV Index:   33 LVOT Area:     3.14 cm  RIGHT VENTRICLE RV S prime:     9.68 cm/s TAPSE (M-mode): 1.6 cm LEFT ATRIUM             Index LA diam:        3.60 cm 1.84 cm/m LA Vol (A2C):   42.1 ml 21.47 ml/m LA Vol (A4C):   36.0 ml 18.36 ml/m LA Biplane Vol: 39.1 ml 19.94 ml/m  AORTIC VALVE AV Area (Vmax):    2.20 cm AV Area (Vmean):   2.44 cm AV Area (VTI):     2.57 cm AV Vmax:           137.00 cm/s AV Vmean:          98.300 cm/s AV VTI:            0.253 m AV Peak Grad:      7.5 mmHg AV Mean Grad:      4.0 mmHg LVOT Vmax:         96.00 cm/s LVOT Vmean:  76.400 cm/s LVOT VTI:          0.207 m LVOT/AV VTI ratio: 0.82  AORTA Ao Root diam: 3.60 cm MV E velocity: 80.60 cm/s MV A velocity: 88.70 cm/s  SHUNTS MV E/A ratio:  0.91        Systemic VTI:  0.21 m                            Systemic Diam: 2.00 cm Ida Rogue MD Electronically signed by Ida Rogue MD Signature Date/Time: 06/25/2021/6:43:21 PM    Final    CT HEAD CODE STROKE WO CONTRAST  Addendum Date: 06/24/2021   ADDENDUM REPORT: 06/24/2021 06:27 ADDENDUM: Study discussed by telephone with Dr. Luvenia Starch SUNG on 06/24/2021 at 0619 hours. Electronically Signed   By: Genevie Ann M.D.   On: 06/24/2021 06:27   Result Date: 06/24/2021 CLINICAL DATA:  Code stroke. 50 year old female with left side weakness. Last known well midnight. History of right carotid endarterectomy. EXAM: CT HEAD WITHOUT CONTRAST TECHNIQUE: Contiguous axial images were  obtained from the base of the skull through the vertex without intravenous contrast. COMPARISON:  Brain MRI 12/03/2012.  Head CT 02/25/2019. FINDINGS: Brain: Abnormal cortical and subcortical white matter hypodensity in both the superior right perirolandic cortex (series 3, image 23) and lateral right occipital pole (image 17) are new since 2020, but appears subacute versus chronic. Possible petechial hemorrhage associated with the latter. No superimposed acute cortically based infarct identified. Elsewhere gray-white matter differentiation is within normal limits. No midline shift, ventriculomegaly, mass effect, evidence of mass lesion, or other intracranial hemorrhage. Vascular: Calcified atherosclerosis at the skull base. No suspicious intracranial vascular hyperdensity. Skull: Negative. Sinuses/Orbits: Visualized paranasal sinuses and mastoids are clear. Other: Visualized orbits and scalp soft tissues are within normal limits. ASPECTS [facility] Stroke Program Early CT Score) Total score (0-10 with 10 being normal): 10 (suspected subacute or chronic cortical infarcts in the right hemisphere). IMPRESSION: 1. Subacute versus chronic appearing cortical infarcts in the right perirolandic and lateral right occipital regions are new since 2020. 2. No superimposed acute cortically based infarct or acute intracranial hemorrhage identified. ASPECTS 10. Electronically Signed: By: Genevie Ann M.D. On: 06/24/2021 06:07   CT ANGIO HEAD CODE STROKE  Result Date: 06/24/2021 CLINICAL DATA:  Slurred speech, left facial droop, left arm weakness, history of right CEA 2017 EXAM: CT ANGIOGRAPHY HEAD AND NECK CT PERFUSION BRAIN TECHNIQUE: Multidetector CT imaging of the head and neck was performed using the standard protocol during bolus administration of intravenous contrast. Multiplanar CT image reconstructions and MIPs were obtained to evaluate the vascular anatomy. Carotid stenosis measurements (when applicable) are obtained  utilizing NASCET criteria, using the distal internal carotid diameter as the denominator. Multiphase CT imaging of the brain was performed following IV bolus contrast injection. Subsequent parametric perfusion maps were calculated using RAPID software. CONTRAST:  128mL OMNIPAQUE IOHEXOL 350 MG/ML SOLN COMPARISON:  CT head earlier same day, CTA 04/08/2020 FINDINGS: CT HEAD FINDINGS Brain: Stable appearance of areas of hypoattenuation and loss of gray-white differentiation in the right parietal lobe and right occipital lobe. As before, there is possible petechial hemorrhage associated with the occipital infarct. No new hemorrhage or mass effect. Ventricles are stable in size. Vascular: No new findings. Skull: No new findings. Sinuses/Orbits: No new findings. Other: None. CTA NECK FINDINGS Aortic arch: Stable appearance of plaque at the great vessel origins. There is direct origin of the left vertebral artery from the arch.  Right carotid system: Circumferential noncalcified plaque along the common carotid is decreased. Decreased plaque at the bifurcation and proximal ICA with improved stenosis post endarterectomy. Left carotid system: Patent. Mixed plaque along the common carotid, bifurcation, and proximal internal carotid. Unchanged 50% stenosis of the proximal ICA. Vertebral arteries: Right vertebral artery is patent. There is unchanged severe stenosis at the origin of the left vertebral with minimal opacification of the P2 segment. Reconstitution is present at the C2-C3 level. Skeleton: Degenerative changes of the cervical spine. Other neck: No acute abnormality. Upper chest: No apical lung mass. Review of the MIP images confirms the above findings CTA HEAD FINDINGS Anterior circulation: Intracranial internal carotid arteries are patent with mixed plaque causing moderate stenosis. Anterior and middle cerebral arteries are patent. Posterior circulation: Intracranial vertebral arteries are patent with diffuse  atherosclerotic irregularity. Left vertebral artery is no longer well seen beyond PICA origin. Basilar artery is patent. Posterior cerebral arteries are patent. Bilateral posterior communicating arteries are present. Venous sinuses: As permitted by contrast timing, patent. Review of the MIP images confirms the above findings CT Brain Perfusion Findings: CBF (<30%) Volume: 54mL Perfusion (Tmax>6.0s) volume: 38mL Mismatch Volume: 107mL Infarction Location: None. IMPRESSION: Stable appearance of the brain.  No new intracranial hemorrhage. Perfusion imaging demonstrates no evidence of core infarction or penumbra. Less stringent criteria demonstrates elevation of T-max corresponding to right occipital infarct, which is probably subacute with petechial hemorrhage. The right parietal infarct is more likely subacute to chronic. Decreased stenosis and plaque of the distal right common carotid and proximal right internal carotid reflecting interval endarterectomy. Intracranial left vertebral artery is now no longer well seen beyond the PICA origin. Remains high-grade stenosis of the left vertebral origin with reconstitution distally in the neck. Remain significant atherosclerotic irregularity of the proximal left intracranial portion with high-grade stenosis likely accounting for loss of distal flow. Electronically Signed   By: Macy Mis M.D.   On: 06/24/2021 13:06   CT ANGIO NECK CODE STROKE  Result Date: 06/24/2021 CLINICAL DATA:  Slurred speech, left facial droop, left arm weakness, history of right CEA 2017 EXAM: CT ANGIOGRAPHY HEAD AND NECK CT PERFUSION BRAIN TECHNIQUE: Multidetector CT imaging of the head and neck was performed using the standard protocol during bolus administration of intravenous contrast. Multiplanar CT image reconstructions and MIPs were obtained to evaluate the vascular anatomy. Carotid stenosis measurements (when applicable) are obtained utilizing NASCET criteria, using the distal internal  carotid diameter as the denominator. Multiphase CT imaging of the brain was performed following IV bolus contrast injection. Subsequent parametric perfusion maps were calculated using RAPID software. CONTRAST:  130mL OMNIPAQUE IOHEXOL 350 MG/ML SOLN COMPARISON:  CT head earlier same day, CTA 04/08/2020 FINDINGS: CT HEAD FINDINGS Brain: Stable appearance of areas of hypoattenuation and loss of gray-white differentiation in the right parietal lobe and right occipital lobe. As before, there is possible petechial hemorrhage associated with the occipital infarct. No new hemorrhage or mass effect. Ventricles are stable in size. Vascular: No new findings. Skull: No new findings. Sinuses/Orbits: No new findings. Other: None. CTA NECK FINDINGS Aortic arch: Stable appearance of plaque at the great vessel origins. There is direct origin of the left vertebral artery from the arch. Right carotid system: Circumferential noncalcified plaque along the common carotid is decreased. Decreased plaque at the bifurcation and proximal ICA with improved stenosis post endarterectomy. Left carotid system: Patent. Mixed plaque along the common carotid, bifurcation, and proximal internal carotid. Unchanged 50% stenosis of the proximal ICA. Vertebral arteries:  Right vertebral artery is patent. There is unchanged severe stenosis at the origin of the left vertebral with minimal opacification of the P2 segment. Reconstitution is present at the C2-C3 level. Skeleton: Degenerative changes of the cervical spine. Other neck: No acute abnormality. Upper chest: No apical lung mass. Review of the MIP images confirms the above findings CTA HEAD FINDINGS Anterior circulation: Intracranial internal carotid arteries are patent with mixed plaque causing moderate stenosis. Anterior and middle cerebral arteries are patent. Posterior circulation: Intracranial vertebral arteries are patent with diffuse atherosclerotic irregularity. Left vertebral artery is no  longer well seen beyond PICA origin. Basilar artery is patent. Posterior cerebral arteries are patent. Bilateral posterior communicating arteries are present. Venous sinuses: As permitted by contrast timing, patent. Review of the MIP images confirms the above findings CT Brain Perfusion Findings: CBF (<30%) Volume: 61mL Perfusion (Tmax>6.0s) volume: 40mL Mismatch Volume: 44mL Infarction Location: None. IMPRESSION: Stable appearance of the brain.  No new intracranial hemorrhage. Perfusion imaging demonstrates no evidence of core infarction or penumbra. Less stringent criteria demonstrates elevation of T-max corresponding to right occipital infarct, which is probably subacute with petechial hemorrhage. The right parietal infarct is more likely subacute to chronic. Decreased stenosis and plaque of the distal right common carotid and proximal right internal carotid reflecting interval endarterectomy. Intracranial left vertebral artery is now no longer well seen beyond the PICA origin. Remains high-grade stenosis of the left vertebral origin with reconstitution distally in the neck. Remain significant atherosclerotic irregularity of the proximal left intracranial portion with high-grade stenosis likely accounting for loss of distal flow. Electronically Signed   By: Macy Mis M.D.   On: 06/24/2021 13:06      Labs: BNP (last 3 results) Recent Labs    06/24/21 0540  BNP 02.3   Basic Metabolic Panel: Recent Labs  Lab 06/24/21 0540 06/26/21 0616 06/28/21 0605  NA 135 137 141  K 4.0 3.7 3.2*  CL 98 103 102  CO2 $Re'25 27 31  'oMp$ GLUCOSE 306* 153* 83  BUN $Re'15 12 13  'IvN$ CREATININE 0.80 0.57 0.57  CALCIUM 9.9 9.4 9.4  MG  --   --  2.0   Liver Function Tests: Recent Labs  Lab 06/24/21 0540 06/25/21 0740 06/26/21 0616  AST 63* 52* 32  ALT 68* 59* 47*  ALKPHOS 85 74 72  BILITOT 0.8 0.6 0.5  PROT 7.2 6.7 6.4*  ALBUMIN 3.8 3.6 3.4*   No results for input(s): LIPASE, AMYLASE in the last 168 hours. No  results for input(s): AMMONIA in the last 168 hours. CBC: Recent Labs  Lab 06/24/21 0540 06/26/21 0616 06/28/21 0605  WBC 18.6* 11.5* 10.3  NEUTROABS 15.7*  --   --   HGB 15.4* 12.7 12.3  HCT 45.2 37.6 36.3  MCV 89.2 88.7 89.9  PLT 326 322 312   Cardiac Enzymes: No results for input(s): CKTOTAL, CKMB, CKMBINDEX, TROPONINI in the last 168 hours. BNP: Invalid input(s): POCBNP CBG: Recent Labs  Lab 06/27/21 0739 06/27/21 1235 06/27/21 1636 06/27/21 2131 06/28/21 0718  GLUCAP 95 172* 201* 194* 72   D-Dimer No results for input(s): DDIMER in the last 72 hours. Hgb A1c No results for input(s): HGBA1C in the last 72 hours. Lipid Profile No results for input(s): CHOL, HDL, LDLCALC, TRIG, CHOLHDL, LDLDIRECT in the last 72 hours. Thyroid function studies Recent Labs    06/28/21 0605  TSH 1.950   Anemia work up No results for input(s): VITAMINB12, FOLATE, FERRITIN, TIBC, IRON, RETICCTPCT in the last 72  hours. Urinalysis    Component Value Date/Time   COLORURINE YELLOW (A) 06/24/2021 0833   APPEARANCEUR HAZY (A) 06/24/2021 0833   APPEARANCEUR Hazy 01/11/2015 1022   LABSPEC 1.032 (H) 06/24/2021 0833   LABSPEC 1.024 01/11/2015 1022   PHURINE 5.0 06/24/2021 0833   GLUCOSEU >=500 (A) 06/24/2021 0833   GLUCOSEU >=1000 (A) 10/19/2019 1034   HGBUR NEGATIVE 06/24/2021 0833   BILIRUBINUR NEGATIVE 06/24/2021 0833   BILIRUBINUR Negative 10/19/2019 1049   BILIRUBINUR Negative 01/11/2015 1022   KETONESUR 20 (A) 06/24/2021 0833   PROTEINUR 100 (A) 06/24/2021 0833   UROBILINOGEN 1.0 10/19/2019 1049   UROBILINOGEN 1.0 10/19/2019 1034   NITRITE NEGATIVE 06/24/2021 0833   LEUKOCYTESUR NEGATIVE 06/24/2021 0833   LEUKOCYTESUR Negative 01/11/2015 1022   Sepsis Labs Invalid input(s): PROCALCITONIN,  WBC,  LACTICIDVEN Microbiology Recent Results (from the past 240 hour(s))  Resp Panel by RT-PCR (Flu A&B, Covid) Nasopharyngeal Swab     Status: None   Collection Time: 06/24/21  6:30  AM   Specimen: Nasopharyngeal Swab; Nasopharyngeal(NP) swabs in vial transport medium  Result Value Ref Range Status   SARS Coronavirus 2 by RT PCR NEGATIVE NEGATIVE Final    Comment: (NOTE) SARS-CoV-2 target nucleic acids are NOT DETECTED.  The SARS-CoV-2 RNA is generally detectable in upper respiratory specimens during the acute phase of infection. The lowest concentration of SARS-CoV-2 viral copies this assay can detect is 138 copies/mL. A negative result does not preclude SARS-Cov-2 infection and should not be used as the sole basis for treatment or other patient management decisions. A negative result may occur with  improper specimen collection/handling, submission of specimen other than nasopharyngeal swab, presence of viral mutation(s) within the areas targeted by this assay, and inadequate number of viral copies(<138 copies/mL). A negative result must be combined with clinical observations, patient history, and epidemiological information. The expected result is Negative.  Fact Sheet for Patients:  EntrepreneurPulse.com.au  Fact Sheet for Healthcare Providers:  IncredibleEmployment.be  This test is no t yet approved or cleared by the [place] FDA and  has been authorized for detection and/or diagnosis of SARS-CoV-2 by FDA under an Emergency Use Authorization (EUA). This EUA will remain  in effect (meaning this test can be used) for the duration of the COVID-19 declaration under Section 564(b)(1) of the Act, 21 U.S.C.section 360bbb-3(b)(1), unless the authorization is terminated  or revoked sooner.       Influenza A by PCR NEGATIVE NEGATIVE Final   Influenza B by PCR NEGATIVE NEGATIVE Final    Comment: (NOTE) The Xpert Xpress SARS-CoV-2/FLU/RSV plus assay is intended as an aid in the diagnosis of influenza from Nasopharyngeal swab specimens and should not be used as a sole basis for treatment. Nasal washings and aspirates are  unacceptable for Xpert Xpress SARS-CoV-2/FLU/RSV testing.  Fact Sheet for Patients: EntrepreneurPulse.com.au  Fact Sheet for Healthcare Providers: IncredibleEmployment.be  This test is not yet approved or cleared by the [place] FDA and has been authorized for detection and/or diagnosis of SARS-CoV-2 by FDA under an Emergency Use Authorization (EUA). This EUA will remain in effect (meaning this test can be used) for the duration of the COVID-19 declaration under Section 564(b)(1) of the Act, 21 U.S.C. section 360bbb-3(b)(1), unless the authorization is terminated or revoked.  Performed at [facility], [address] [place], [place] [address]   Culture, blood (routine x 2) Call MD if unable to obtain prior to antibiotics being given     Status: None (Preliminary result)  Collection Time: 06/25/21  7:40 AM   Specimen: BLOOD  Result Value Ref Range Status   Specimen Description BLOOD BLOOD RIGHT FOREARM  Final   Special Requests   Final    BOTTLES DRAWN AEROBIC AND ANAEROBIC Blood Culture adequate volume   Culture   Final    NO GROWTH 3 DAYS Performed at [facility], [address], [place], [place] [address]    Report Status PENDING  Incomplete  Culture, blood (routine x 2) Call MD if unable to obtain prior to antibiotics being given     Status: None (Preliminary result)   Collection Time: 06/25/21  7:48 AM   Specimen: BLOOD  Result Value Ref Range Status   Specimen Description BLOOD RIGHT WRIST  Final   Special Requests   Final    BOTTLES DRAWN AEROBIC AND ANAEROBIC Blood Culture adequate volume   Culture   Final    NO GROWTH 3 DAYS Performed at [facility], [address] [place],  [address]    Report Status PENDING  Incomplete     Total time spend on discharging this patient, including the last patient exam, discussing the hospital stay, instructions for ongoing care as it relates to  all pertinent caregivers, as well as preparing the medical discharge records, prescriptions, and/or referrals as applicable, is 45 minutes.    Enzo Bi, MD  [facility] 06/28/2021, 11:28 AM

## 2021-06-28 NOTE — Progress Notes (Signed)
PMR Admission Coordinator Pre-Admission Assessment   Patient: Erika [name] is an 50 y.o., female MRN: [id number] DOB: 07-31-1971 Height: _0  (170.2 cm) Weight: 84.4 kg   Insurance Information HMO: yes    PPO:      PCP:      IPA:      80/20:      OTHER: PRIMARY: [employer]      Policy#: [id number]      Subscriber: patient CM Name: Serina Cowper      Phone#: [phone]     Fax#: [phone], I received Auth from Luisa Hart at [facility] on 7/6 for admission 7/6-7/12, with clinical updates due 1/10 Pre-Cert#: [id number]      Employer: Benefits:  Phone #: Online - [email or web address] EEff Date: 12/25/2019 - 12/23/2021 Deductible: $1,250 ($1,250 met) OOP Max: $4,500 ($3,444.02 met) CIR: 80% coverage, 20% co-insurance SNF: 80% coverage, 20% co-insurance Outpatient: $50 copay; limited to 20 visits/cal yr (20 remaining) Home Health:  80% coverage, 20% co-insurance DME: 100% coverage , auth required Providers: in-network  SECONDARY: Medicare A      Policy#: [id number]     Phone#: verified eligibility via [employer] on 06/26/21   Financial Counselor:       Phone#:   The "Data Collection Information Summary" for patients in Inpatient Rehabilitation Facilities with attached "Privacy Act [place] Records" was provided and verbally reviewed with: Patient   Emergency Contact Information Contact Information       Name Relation Home Work Mobile    [place] Spouse   [phone]           Current Medical History  Patient Admitting Diagnosis: CVA History of Present Illness:Pt is a 50 y/o  woman with past medical history significant for HTN, HLD, DM, tobacco abuse, PAD, CAD, CABG, dCHF, peptic foot drop, renal artery stenosis, CAD (s/p of R CEA 2017), PTSD, adjustment disorder, MI, obesity admitted on 06/24/21 with c/c of slurred speech, L facial droop & LUE weakness. MRI revealed acute R pontine perforator infarct with chronic R parietal & occipital  infarcts. During admission, she was also found to have acute respiratory failure 2/2 RUL pneumonia and started on Roecephin/Doxy/Unasyn. Pt. Also with cluster headaches, husband reports she sometimes needs 15L of oxygen at home when these occur. She has orders for NRB PRN 100% 02      Complete NIHSS TOTAL: 6   Patient's medical record [facility] has been reviewed by the rehabilitation admission coordinator and physician.   Past Medical History      Past Medical History:  Diagnosis Date   Arthralgia of temporomandibular joint     CAD, multiple vessel      a. 06/2016 Cath: ostLM 40%, ostLAD 40%, pLAD 95%, ost-pLCx 60%, pLCx 95%, mLCx 60%, mRCA 95%, D2 50%, LVSF nl;  b. 07/2016 CABG x 4 (LIMA->LAD, VG->Diag, VG->OM, VG->RCA); c. 08/2016 Cath: 3VD w/ 4/4 patent grafts. LAD distal to LIMA has diff dzs->Med rx; d. 08/2020 Cath: 4/4 patent grafts, native 3VD. EF 55-65%-->Med Rx.   Carotid arterial disease ([place])      a. 07/2016 s/p R CEA; b. 02/2021 U/S: RICA 16-60%, LICA 6-00%.   Clotting disorder ([place])     Depression     Diastolic dysfunction      a. 06/2016 Echo: EF 50-55%, mild inf wall HK, GR1DD, mild MR, RV sys fxn nl, mildly dilated LA, PASP nl   Fatty liver disease, nonalcoholic 4599   History of blood transfusion  with heart surgery   HLD (hyperlipidemia)     Labile hypertension      a. prior renal ngiogram negative for RAS in 03/2016; b. catecholamines and metanephrines normal, mildly elevated renin with normal aldosterone and normal ratio in 02/2016   Myocardial infarction [facility]) 2017   Obesity     PAD (peripheral artery disease) ([place])      a. 09/2018 s/p L SFA stenting; b. 07/2019 Periph Angio: Patent m/d L SFA stent w/ 100% L SFA distal to stent. L AT 100d, L Peroneal diff dzs-->Med Rx; c. 02/2021 ABIs: stable @ 0.61 on R and 0.46 on L.   PTSD (post-traumatic stress disorder)     Tobacco abuse     Type 2 diabetes mellitus ([place]) 12/2015      Family History   family  history includes Alcohol abuse in her father; Anxiety disorder in her sister; Diabetes in her father and mother; Drug abuse in her father; Heart disease in her father; Stroke in her sister. She was adopted.   Prior Rehab/Hospitalizations Has the patient had prior rehab or hospitalizations prior to admission? No   Has the patient had major surgery during 100 days prior to admission? No               Current Medications   Current Facility-Administered Medications:   albuterol (PROVENTIL) (2.5 MG/3ML) 0.083% nebulizer solution 3 mL, 3 mL, Nebulization, Q4H PRN, Ivor Costa, MD   Ampicillin-Sulbactam (UNASYN) 3 g in sodium chloride 0.9 % 100 mL IVPB, 3 g, Intravenous, Q6H, Griffith, Kelly A, DO, Last Rate: 200 mL/hr at 06/26/21 1312, 3 g at 06/26/21 1312   aspirin chewable tablet 81 mg, 81 mg, Oral, Daily, Theora Gianotti, NP, 81 mg at 06/26/21 7672   benztropine (COGENTIN) tablet 0.5 mg, 0.5 mg, Oral, QHS, Ivor Costa, MD, 0.5 mg at 06/24/21 2101   carvedilol (COREG) tablet 37.5 mg, 37.5 mg, Oral, BID WC, Theora Gianotti, NP   chlorproMAZINE (THORAZINE) tablet 100 mg, 100 mg, Oral, QHS, Niu, Soledad Gerlach, MD, 100 mg at 06/24/21 2222   clonazePAM (KLONOPIN) tablet 0.5 mg, 0.5 mg, Oral, TID PRN, Ivor Costa, MD, 0.5 mg at 06/24/21 1632   clopidogrel (PLAVIX) tablet 75 mg, 75 mg, Oral, Daily, Ivor Costa, MD, 75 mg at 06/26/21 0840   dextromethorphan-guaiFENesin ([place] DM) 30-600 MG per 12 hr tablet 1 tablet, 1 tablet, Oral, BID, Nicole Kindred A, DO, 1 tablet at 06/26/21 1259   enoxaparin (LOVENOX) injection 40 mg, 40 mg, Subcutaneous, Q24H, Ivor Costa, MD, 40 mg at 06/25/21 2120   ezetimibe (ZETIA) tablet 10 mg, 10 mg, Oral, Daily, Gollan, Kathlene November, MD   FLUoxetine (PROZAC) capsule 60 mg, 60 mg, Oral, Daily, Ivor Costa, MD, 60 mg at 06/26/21 0840   home med stored in pyxis 1 each, 1 each, Oral, QHS, Nicole Kindred A, DO   hydrALAZINE (APRESOLINE) injection 5 mg, 5 mg, Intravenous,  Q2H PRN, Nicole Kindred A, DO   insulin aspart (novoLOG) injection 0-5 Units, 0-5 Units, Subcutaneous, QHS, Ivor Costa, MD, 2 Units at 06/25/21 2129   insulin aspart (novoLOG) injection 0-9 Units, 0-9 Units, Subcutaneous, TID WC, Ivor Costa, MD, 2 Units at 06/26/21 1259   insulin glargine (LANTUS) injection 45 Units, 45 Units, Subcutaneous, BID, Ivor Costa, MD, 45 Units at 06/26/21 1132   isosorbide mononitrate (IMDUR) 24 hr tablet 30-60 mg, 30-60 mg, Oral, BID, Ivor Costa, MD, 60 mg at 06/26/21 0840   lamoTRIgine (LAMICTAL) tablet 200 mg, 200 mg, Oral,  Gordan Payment, MD, 200 mg at 06/25/21 2121   losartan (COZAAR) tablet 100 mg, 100 mg, Oral, Daily, Nicole Kindred A, DO   nicotine (NICODERM CQ - dosed in mg/24 hours) patch 21 mg, 21 mg, Transdermal, Daily, Ivor Costa, MD, 21 mg at 06/26/21 1131   nitroGLYCERIN (NITROSTAT) SL tablet 0.4 mg, 0.4 mg, Sublingual, Q5 min PRN, Ivor Costa, MD   ondansetron [facility]) injection 4 mg, 4 mg, Intravenous, Q8H PRN, Ivor Costa, MD   oxyCODONE (Oxy IR/ROXICODONE) immediate release tablet 5 mg, 5 mg, Oral, Q6H PRN, Ivor Costa, MD, 5 mg at 06/26/21 1259   pregabalin (LYRICA) capsule 225 mg, 225 mg, Oral, BID, Ivor Costa, MD, 225 mg at 06/26/21 6203   rosuvastatin (CRESTOR) tablet 20 mg, 20 mg, Oral, Daily, Ivor Costa, MD, 20 mg at 06/26/21 0840   senna-docusate (Senokot-S) tablet 1 tablet, 1 tablet, Oral, QHS PRN, Ivor Costa, MD   Patients Current Diet:  Diet Order                  DIET DYS 3 Room service appropriate? Yes with Assist; Fluid consistency: Thin  Diet effective now                         Precautions / Restrictions Precautions Precautions: Fall Precaution Comments: L hemi (UE>LE) Restrictions Weight Bearing Restrictions: No    Has the patient had 2 or more falls or a fall with injury in the past year? Yes   Prior Activity Level Limited Community (1-2x/wk): out of house 3x/week; drives   Prior Functional Level Self Care: Did  the patient need help bathing, dressing, using the toilet or eating? Independent   Indoor Mobility: Did the patient need assistance with walking from room to room (with or without device)? Independent   Stairs: Did the patient need assistance with internal or external stairs (with or without device)? Independent   Functional Cognition: Did the patient need help planning regular tasks such as shopping or remembering to take medications? Independent   Home Assistive Devices / Equipment Home Assistive Devices/Equipment: None Home Equipment: Shower seat - built in, Hand held shower head, Kasandra Knudsen - single point   Prior Device Use: Indicate devices/aids used by the patient prior to current illness, exacerbation or injury? None of the above   Current Functional Level Cognition   Overall Cognitive Status: Within Functional Limits for tasks assessed Orientation Level: Oriented X4 General Comments: very motivated!, speech improved    Extremity Assessment (includes Sensation/Coordination)   Upper Extremity Assessment:  (hx of R radial nerve palsy; LUE per OT assessment) LUE Deficits / Details: shoulder flexion 3/5, elbow flex/ext 4-/5, grip 4/5; impaired coordination with testing, sensation intact, function improves with visual attention to task, able to distinguish hot/cold LUE Sensation: WNL LUE Coordination: decreased fine motor, decreased gross motor  Lower Extremity Assessment: LLE deficits/detail (RLE WFL) LLE Deficits / Details: 2-/5 hip flexion in sitting, 2+/5 ankle dorsiflexion, 3-/5 knee extension in sitting, LLE heel to shin slowed movement LLE Coordination: decreased fine motor, decreased gross motor     ADLs   Overall ADL's : Needs assistance/impaired Eating/Feeding: Sitting, Cueing for safety Eating/Feeding Details (indicate cue type and reason): cues for swallowing precautions provided by SLP (on paper in room); with encouragement, pt attempts to open packets using L hand to pinch  but when unsuccessful able to transfer packet to L hand to stabilize and use R hand to rip open packet, able to use  L hand to stabilize cups/containers with minimal instability. Grooming: Wash/dry hands, Standing, Min guard Grooming Details (indicate cue type and reason): CGA, cues for LUE WBing on counter for stability, dynamic reaching outside BOS L and R, alternating BUE Upper Body Bathing: Standing, Minimal assistance Upper Body Bathing Details (indicate cue type and reason): Pt stood at sink, cue for LUE WBing through elbow on counter for stability, L knee blocked, Min A to complete hair washing under sink faucet, using dominany RUE Lower Body Dressing: Sitting/lateral leans, Minimal assistance Lower Body Dressing Details (indicate cue type and reason): Able to don R sock seated EOB with increased time/effort and LUE attempting to assist in task; required MIN A to initiate donning of L sock after pt attempted and was unable to get over toes, pt able to complete using B hands (LUE with limited strength and coordination to assist much) Toilet Transfer: Ambulation, Min guard, Minimal assistance Toilet Transfer Details (indicate cue type and reason): handheld assist, CGA, slow, tends to reach out for stability with RUE, MIN A to correct for slight LOB Toileting- Clothing Manipulation and Hygiene: Sitting/lateral lean, Minimal assistance Toileting - Clothing Manipulation Details (indicate cue type and reason): able to perform pericare using dominant RUE, in standing and VC to initiate, pt incorporates LUE into task of pulling her hands up, improved functional performance when asked pt to hold onto grab bar with RUE to allow for LUE opportunity to perform, MIN A to complete on R hip Functional mobility during ADLs: Minimal assistance, Cueing for safety General ADL Comments: MIN A 2/2 decreased balance and LLE feeling as though it may give way per pt report     Mobility   Overal bed mobility: Needs  Assistance Bed Mobility: Supine to Sit, Sit to Supine Supine to sit: Supervision, HOB elevated Sit to supine: Min assist, HOB elevated General bed mobility comments: Pt sat back down brought BLE into the bed and swung them around to the other side, requiring to sit up on the other side of the bed to eat lunch, educated in BUE positioning to help support LUE into task but ultimately required MIN A for trunk support to rotate to R side     Transfers   Overall transfer level: Needs assistance Equipment used: None Transfers: Sit to/from Stand Sit to Stand: Min assist, Min guard General transfer comment: repeated STS transfers from EOB to standing at sink     Ambulation / Gait / Stairs / Emergency planning/management officer   Ambulation/Gait Ambulation/Gait assistance: Herbalist (Feet): 50 Feet Assistive device: Rolling walker (2 wheeled) Gait Pattern/deviations: Decreased step length - right, Decreased step length - left, Decreased stride length, Decreased weight shift to left General Gait Details: step to pattern, cued for heel strike on LLE. several standing rest breaks Gait velocity: decreased     Posture / Balance Dynamic Sitting Balance Sitting balance - Comments: poor dynamic sitting balance with slight LOB when attempting to don L sock EOB requiring MIN A to correct; fair static balance Balance Overall balance assessment: Needs assistance Sitting-balance support: No upper extremity supported, Feet supported Sitting balance-Leahy Scale: Good Sitting balance - Comments: poor dynamic sitting balance with slight LOB when attempting to don L sock EOB requiring MIN A to correct; fair static balance Standing balance support: Single extremity supported, During functional activity Standing balance-Leahy Scale: Fair Standing balance comment: requires at least UE on counter or hips leaning against counter for stability with dynamic reaching outside BOS  Special needs/care consideration  Diabetic management Lantus: 45 units 2x daily; novoLOG: 0-5 units daily at bedtime; novoLOG: 0-9 units 3x daily at [employer] and Designated visitor [employer], husband    Previous Environmental health practitioner (from acute therapy documentation) Living Arrangements: Spouse/significant other  Lives With: Spouse Available Help at Discharge: Family, Available 24 hours/day Type of Home: House Home Layout: One level Home Access: Stairs to enter Entrance Stairs-Rails: Right Entrance Stairs-Number of Steps: 4 Bathroom Shower/Tub: Multimedia programmer: Standard Bathroom Accessibility: Yes How Accessible: Accessible via walker [place]: No   Discharge Living Setting Plans for Discharge Living Setting: Patient's home Type of Home at Discharge: [place]: One level Discharge Home Access: Stairs to enter Entrance Stairs-Rails: Right Entrance Stairs-Number of Steps: 4 Discharge Bathroom Shower/Tub: Walk-in shower Discharge Bathroom Toilet: Standard Discharge Bathroom Accessibility: Yes How Accessible: Accessible via walker Does the patient have any problems obtaining your medications?: No   Social/Family/Support Systems Anticipated Caregiver: Ameilia Rattan, husband Anticipated Caregiver's Contact Information: [phone] Caregiver Availability: 24/7 Discharge Plan Discussed with Primary Caregiver: Yes Is Caregiver In Agreement with Plan?: Yes Does Caregiver/Family have Issues with Lodging/Transportation while Pt is in Rehab?: No   Goals Patient/Family Goal for Rehab: PT/OT/SLP Supervision  Expected length of stay:12-14 days Pt/Family Agrees to Admission and willing to participate: Yes Program Orientation Provided & Reviewed with Pt/Caregiver Including Roles  & Responsibilities: Yes   Decrease burden of Care through IP rehab admission: NA   Possible need for SNF placement upon discharge: Not anticipated   Patient Condition: I have reviewed medical records  from [facility], spoken with CM, and patient. I discussed via phone for inpatient rehabilitation assessment.  Patient will benefit from ongoing PT and OT, can actively participate in 3 hours of therapy a day 5 days of the week, and can make measurable gains during the admission.  Patient will also benefit from the coordinated team approach during an Inpatient Acute Rehabilitation admission.  The patient will receive intensive therapy as well as Rehabilitation physician, nursing, social worker, and care management interventions.  Due to safety, skin/wound care, disease management, medication administration, pain management, and patient education the patient requires 24 hour a day rehabilitation nursing.  The patient is currently min-mod A  with mobility and basic ADLs.  Discharge setting and therapy post discharge at home with home health is anticipated.  Patient has agreed to participate in the Acute Inpatient Rehabilitation Program and will admit today.   Preadmission Screen Completed By:  Bethel Born, 06/26/2021 3:17 PM ______________________________________________________________________   Discussed status with Dr. Ranell Patrick  on 06/28/21 at 1030            and received approval for admission today.   Admission Coordinator:  Bethel Born, [place], time 5035  /Date 06/28/21 with updates by Clemens Catholic, SLP   Assessment/Plan: Diagnosis: CVA Does the need for close, 24 hr/day Medical supervision in concert with the patient's rehab needs make it unreasonable for this patient to be served in a less intensive setting? Yes Co-Morbidities requiring supervision/potential complications: HLD, adjustment disorder with mixed features, major depressive disorder, type 2 DM, CAD Due to bladder management, bowel management, safety, skin/wound care, disease management, medication administration, pain management, and patient education, does the patient require 24 hr/day rehab nursing?  Yes Does the patient require coordinated care of a physician, rehab nurse, PT, OT, SLP to address physical and functional deficits in the context of the above medical diagnosis(es)? Yes  Addressing deficits in the following areas: balance, endurance, locomotion, strength, transferring, bowel/bladder control, bathing, dressing, feeding, grooming, toileting, cognition, and psychosocial support, speech, swallow Can the patient actively participate in an intensive therapy program of at least 3 hrs of therapy 5 days a week? Yes The potential for patient to make measurable gains while on inpatient rehab is excellent Anticipated functional outcomes upon discharge from inpatient rehab: supervision PT, supervision OT, supervision SLP Estimated rehab length of stay to reach the above functional goals is: 14-17 days Anticipated discharge destination: Home 10. Overall Rehab/Functional Prognosis: excellent     MD Signature: Leeroy Cha, MD

## 2021-06-28 NOTE — Progress Notes (Signed)
Inpatient Rehabilitation Medication Review by a Pharmacist  A complete drug regimen review was completed for this patient to identify any potential clinically significant medication issues.  Clinically significant medication issues were identified:  no  Check AMION for pharmacist assigned to patient if future medication questions/issues arise during this admission.  Pharmacist comments:   Time spent performing this drug regimen review (minutes):  10   Ramond Craver 06/28/2021 5:29 PM

## 2021-06-28 NOTE — Discharge Instructions (Addendum)
Inpatient Rehab Discharge Instructions  Erika [name] Discharge date and time:  08/01/21  Activities/Precautions/ Functional Status: Activity: no lifting, driving, or strenuous exercise for till cleared by MD Diet: cardiac diet and diabetic diet Wound Care: none needed   Functional status:  ___ No restrictions     ___ Walk up steps independently __X_ 24/7 supervision/assistance   ___ Walk up steps with assistance ___ Intermittent supervision/assistance  ___ Bathe/dress independently ___ Walk with walker     ___ Bathe/dress with assistance ___ Walk Independently    ___ Shower independently _X__ Walk with assistance    __X_ Shower with assistance __X_ No alcohol     ___ Return to work/school ________   Special Instructions:  Has to be upright for meals and stay up for at least 30-45 minutes 2. Do not talk and eat at the same time.  3. Use Home sliding scale.      COMMUNITY REFERRALS UPON DISCHARGE:    Outpatient: PT, OT, SP             Agency:[facility] Phone:[phone]              Appointment Date/Time:WILL CALL HUSBAND TO SET UP APPOINTMENTS  Medical Equipment/Items Ordered: WHEELCHAIR, 3 IN 1 AND DROP-ARM BEDSIDE COMMODE                                                 Agency/Supplier: [facility]  [phone]      STROKE/TIA DISCHARGE INSTRUCTIONS SMOKING Cigarette smoking nearly doubles your risk of having a stroke & is the single most alterable risk factor  If you smoke or have smoked in the last 12 months, you are advised to quit smoking for your health. Most of the excess cardiovascular risk related to smoking disappears within a year of stopping. Ask you doctor about anti-smoking medications [facility] Quit Line: 1-800-QUIT NOW Free Smoking Cessation Classes [phone]  CHOLESTEROL Know your levels; limit fat & cholesterol in your diet  Lipid Panel     Component Value Date/Time   CHOL 202 (H) 06/25/2021 0740   CHOL 121 08/21/2016 0817    TRIG 636 (H) 06/25/2021 0740   HDL 29 (L) 06/25/2021 0740   HDL 24 (L) 08/21/2016 0817   CHOLHDL 7.0 06/25/2021 0740   VLDL UNABLE TO CALCULATE IF TRIGLYCERIDE OVER 400 mg/dL 06/25/2021 0740   LDLCALC UNABLE TO CALCULATE IF TRIGLYCERIDE OVER 400 mg/dL 06/25/2021 0740   LDLCALC 64 08/21/2016 0817     Many patients benefit from treatment even if their cholesterol is at goal. Goal: Total Cholesterol (CHOL) less than 160 Goal:  Triglycerides (TRIG) less than 150 Goal:  HDL greater than 40 Goal:  LDL (LDLCALC) less than 100   BLOOD PRESSURE American Stroke Association blood pressure target is less that 120/80 mm/Hg  Your discharge blood pressure is:  BP: (!) 144/80 Monitor your blood pressure Limit your salt and alcohol intake Many individuals will require more than one medication for high blood pressure  DIABETES (A1c is a blood sugar average for last 3 months) Goal HGBA1c is under 7% (HBGA1c is blood sugar average for last 3 months)  Diabetes:     Lab Results  Component Value Date   HGBA1C 10.7 (H) 06/25/2021    Your HGBA1c can be lowered with medications, healthy diet, and exercise. Check your blood sugar  as directed by your physician Call your physician if you experience unexplained or low blood sugars.  PHYSICAL ACTIVITY/REHABILITATION Goal is 30 minutes at least 4 days per week  Activity: No driving, Therapies: see above Return to work: [name] Activity decreases your risk of heart attack and stroke and makes your heart stronger.  It helps control your weight and blood pressure; helps you relax and can improve your mood. Participate in a regular exercise program. Talk with your doctor about the best form of exercise for you (dancing, walking, swimming, cycling).  DIET/WEIGHT Goal is to maintain a healthy weight  Your discharge diet is:  Diet Order             Diet Carb Modified Fluid consistency: Thin; Room service appropriate? Yes  Diet effective now                    liquids Your height is:  Height: 5\' 6"  (167.6 cm) Your current weight is: Weight: 83.7 kg Your Body Mass Index (BMI) is:  BMI (Calculated): 29.8 Following the type of diet specifically designed for you will help prevent another stroke. Your goal weight is  Your goal Body Mass Index (BMI) is 19-24. Healthy food habits can help reduce 3 risk factors for stroke:  High cholesterol, hypertension, and excess weight.  RESOURCES Stroke/Support Group:  Call [phone]   STROKE EDUCATION PROVIDED/REVIEWED AND GIVEN TO PATIENT Stroke warning signs and symptoms How to activate emergency medical system (call 911). Medications prescribed at discharge. Need for follow-up after discharge. Personal risk factors for stroke. Pneumonia vaccine given:  Flu vaccine given:  My questions have been answered, the writing is legible, and I understand these instructions.  I will adhere to these goals & educational materials that have been provided to me after my discharge from the hospital.      My questions have been answered and I understand these instructions. I will adhere to these goals and the provided educational materials after my discharge from the hospital.  Patient/Caregiver Signature _______________________________ Date __________  Clinician Signature _______________________________________ Date __________  Please bring this form and your medication list with you to all your follow-up doctor's appointments.

## 2021-06-28 NOTE — Progress Notes (Signed)
Patient arrived on the unit via stretcher, A&O x4, denied pain or discomfort. Assessment done. Educated on Chief Strategy Officer. We continue to monitor.

## 2021-06-28 NOTE — Progress Notes (Signed)
Orthopedic Tech Progress Note Patient Details:  Erika [name] [birth year] [id number]  Called in order to [facility] for a REHAB COMBO   Patient ID: Erika [name], female   DOB: 05-24-1971, 50 y.o.   MRN: [id number]  [name] 06/28/2021, 5:27 PM

## 2021-06-28 NOTE — Progress Notes (Signed)
SLP Cancellation Note  Patient Details Name: Erika [name] MRN: [id number] DOB: 24-Aug-1971   Cancelled treatment:       Reason Eval/Treat Not Completed:  (pt discharging to CIR) Transport at pt's room for pt's D/C to CIR. Husband and pt stated her speech and swallowing have improved to prior -- no difficulty w/ the dysphagia level 3 diet consistency last night at [place] nor at Breakfast meal this morning. Pt and Husband are cutting any tougher pieces of foods, moistening well. Pt stated her mastication/chewing was "fine" for her foods this morning; No reports of coughing w/ thins, foods.  Encouraged pt to continue over-articulation and slowing rate during conversation w/ others; continue general aspiration precautions. Pt has handouts on both.      Orinda Kenner, MS, CCC-SLP Speech Language Pathologist Rehab Services [phone] [facility] 06/28/2021, 12:31 PM

## 2021-06-28 NOTE — Progress Notes (Signed)
Report given to CIR RN Dadum. All questions answered at this time.  Patient's belongings packed by Husband at bedside.  Patient vitals WDL at time of transfer.

## 2021-06-28 NOTE — Progress Notes (Signed)
CIR informed CSW they have a bed for patient today, MD agreeable to dc.   CIR reports they have arranged transport through [facility] however CIR requested CSW complete the med necessity transport form for them. CSW has printed form and notified CIR of needed electronic signature for form, as this CSW did not arrange transport and therefore cannot sign as doing so.   RN made aware to call Clemens Catholic who set up [facility] with any problems at transport.  CSW signing off.   [place], [place]

## 2021-06-28 NOTE — Progress Notes (Signed)
Patient ID: Erika [name], female   DOB: 01-14-1971, 50 y.o.   MRN: [id number] Met with the patient to introduce self and the role of the nurse CM. Initiated education for secondary stroke risks including HTN, HLD, DM and obesity. Patient also noted hx of HF, PAD, fatty liver and kidney issues. Reviewed diet and dietary modifications recommended to address risk factors and smoking cessation. Currently using nicoderm patch. Hx of cluster HAs and noted mild headache at present. Continue to follow along to discharge to address educational needs and facilitate preparation for discharge. Margarito Liner

## 2021-06-28 NOTE — Progress Notes (Signed)
Inpatient Rehabilitation  Patient information reviewed and entered into eRehab system by Charmagne Buhl M. Riker Collier, M.A., CCC/SLP, PPS Coordinator.  Information including medical coding, functional ability and quality indicators will be reviewed and updated through discharge.    

## 2021-06-28 NOTE — Progress Notes (Signed)
Progress Note  Patient Name: Erika [name] Date of Encounter: 06/28/2021  [facility] Cardiologist: [name], MD   Subjective   Denies chest pain, shortness of breath or palpitations.  Still with left-sided weakness.  Plan for discharge to rehab today.  Inpatient Medications    Scheduled Meds:  Continuous Infusions:  PRN Meds:    Vital Signs    Vitals:   06/28/21 0120 06/28/21 0416 06/28/21 0700 06/28/21 1208  BP:  119/69 118/68 122/74  Pulse:  67 70 (!) 55  Resp:  16 17 17   Temp:  98.5 F (36.9 C) 97.6 F (36.4 C) (!) 97.5 F (36.4 C)  TempSrc:  Oral Oral Oral  SpO2:  92% 90% 99%  Weight: 57.4 kg     Height:        Intake/Output Summary (Last 24 hours) at 06/28/2021 1358 Last data filed at 06/28/2021 0803 Gross per 24 hour  Intake 1728.69 ml  Output 740 ml  Net 988.69 ml   Last 3 Weights 06/28/2021 06/24/2021 05/25/2021  Weight (lbs) 126 lb 8.7 oz 186 lb 196 lb  Weight (kg) 57.4 kg 84.369 kg 88.905 kg  Some encounter information is confidential and restricted. Go to Review Flowsheets activity to see all data.      Telemetry    Sinus bradycardia, heart rate 54- Personally Reviewed  ECG    No new tracing obtained- Personally Reviewed  Physical Exam   GEN: No acute distress.   Neck: No JVD Cardiac: RRR, no murmurs, rubs, or gallops.  Respiratory: Clear to auscultation bilaterally. GI: Soft, nontender, non-distended  MS: No edema; No deformity. Neuro:  Nonfocal  Psych: Normal affect   Labs    High Sensitivity Troponin:   Recent Labs  Lab 06/24/21 0552 06/24/21 0812 06/24/21 1305 06/25/21 1111  TROPONINIHS 106* 172* 250* 150*      Chemistry Recent Labs  Lab 06/24/21 0540 06/25/21 0740 06/26/21 0616 06/28/21 0605  NA 135  --  137 141  K 4.0  --  3.7 3.2*  CL 98  --  103 102  CO2 25  --  27 31  GLUCOSE 306*  --  153* 83  BUN 15  --  12 13  CREATININE 0.80  --  0.57 0.57  CALCIUM 9.9  --  9.4 9.4  PROT 7.2 6.7 6.4*  --    ALBUMIN 3.8 3.6 3.4*  --   AST 63* 52* 32  --   ALT 68* 59* 47*  --   ALKPHOS 85 74 72  --   BILITOT 0.8 0.6 0.5  --   GFRNONAA >60  --  >60 >60  ANIONGAP 12  --  7 8     Hematology Recent Labs  Lab 06/24/21 0540 06/26/21 0616 06/28/21 0605  WBC 18.6* 11.5* 10.3  RBC 5.07 4.24 4.04  HGB 15.4* 12.7 12.3  HCT 45.2 37.6 36.3  MCV 89.2 88.7 89.9  MCH 30.4 30.0 30.4  MCHC 34.1 33.8 33.9  RDW 13.9 13.8 14.2  PLT 326 322 312    BNP Recent Labs  Lab 06/24/21 0540  BNP 33.3     DDimer No results for input(s): DDIMER in the last 168 hours.   Radiology    MR BRAIN WO CONTRAST  Result Date: 06/27/2021 CLINICAL DATA:  Neuro deficit, acute, stroke suspected. EXAM: MRI HEAD WITHOUT CONTRAST TECHNIQUE: Multiplanar, multiecho pulse sequences of the brain and surrounding structures were obtained without intravenous contrast. COMPARISON:  MRI of the brain  June 24, 2021. FINDINGS: Brain: Area of restricted diffusion involving the right side of the pons, consistent with subacute infarct, unchanged from prior MRI. No new focus of restricted diffusion to suggest new acute infarct. No hemorrhage, hydrocephalus, extra-axial collection or mass lesion. Area of encephalomalacia and gliosis in the right parietal and occipital lobes with associated cortical laminar necrosis, unchanged. Scattered foci of T2 hyperintensity are seen within the white matter of the cerebral hemispheres, nonspecific. Vascular: Normal flow voids. Skull and upper cervical spine: Normal marrow signal. Sinuses/Orbits: Negative. Other: None. IMPRESSION: 1. No new acute infarct since prior MRI. 2. Subacute infarct on the right side of the pons, similar to prior MRI. 3. Area of encephalomalacia and gliosis in the right parietooccipital region with associated cortical laminar necrosis, unchanged. 4. Small amount of nonspecific T2 hyperintense lesions of the white matter. Differential diagnosis include chronic microangiopathy, vasculitis  and post inflammatory/infectious processes. Electronically Signed   By: Pedro Earls M.D.   On: 06/27/2021 12:47    Cardiac Studies   Echo 06/25/2021 1. Left ventricular ejection fraction, by estimation, is 60 to 65%. The  left ventricle has normal function. The left ventricle has no regional  wall motion abnormalities. Left ventricular diastolic parameters were  normal.   2. Right ventricular systolic function is normal. The right ventricular  size is normal. Tricuspid regurgitation signal is inadequate for assessing  PA pressure.   3. The mitral valve is normal in structure. No evidence of mitral valve  regurgitation. No evidence of mitral stenosis.   4. The aortic valve is normal in structure. Aortic valve regurgitation is  not visualized. No aortic stenosis is present.   5. The inferior vena cava is normal in size with greater than 50%  respiratory variability, suggesting right atrial pressure of 3 mmHg.   Patient Profile     50 y.o. female CAD/CABG, carotid stenosis s/p CEA, hypertension, hyperlipidemia, PAD s/p SFA stent presenting to the ED hospital with left-sided weakness diagnosed with CVA being seen due to elevated troponins and labile blood pressures  Assessment & Plan    1.  Hypertension -BP controlled on current regimen -Continue Coreg 25 mg twice daily, Imdur 60, Aldactone 25, losartan 25. -Continue hypertensive meds as prescribed in-house upon discharge  2.  Elevated troponins -Likely from demand supply mismatch -Echocardiogram with preserved ejection fraction -Denies chest pain, no indication for additional testing  3.  CVA, right pontine, parieto-occipital infarct -Management as per neurology -PT OT -Dispo planning underway.  Total encounter time 35 minutes  Greater than 50% was spent in counseling and coordination of care with the patient     Signed, Kate Sable, MD  06/28/2021, 1:58 PM

## 2021-06-29 LAB — CBC WITH DIFFERENTIAL/PLATELET
Abs Immature Granulocytes: 0.14 10*3/uL — ABNORMAL HIGH (ref 0.00–0.07)
Basophils Absolute: 0.1 10*3/uL (ref 0.0–0.1)
Basophils Relative: 0 %
Eosinophils Absolute: 0.2 10*3/uL (ref 0.0–0.5)
Eosinophils Relative: 1 %
HCT: 37.8 % (ref 36.0–46.0)
Hemoglobin: 12.3 g/dL (ref 12.0–15.0)
Immature Granulocytes: 1 %
Lymphocytes Relative: 26 %
Lymphs Abs: 3 10*3/uL (ref 0.7–4.0)
MCH: 29.9 pg (ref 26.0–34.0)
MCHC: 32.5 g/dL (ref 30.0–36.0)
MCV: 92 fL (ref 80.0–100.0)
Monocytes Absolute: 0.7 10*3/uL (ref 0.1–1.0)
Monocytes Relative: 6 %
Neutro Abs: 7.5 10*3/uL (ref 1.7–7.7)
Neutrophils Relative %: 66 %
Platelets: 318 10*3/uL (ref 150–400)
RBC: 4.11 MIL/uL (ref 3.87–5.11)
RDW: 14.3 % (ref 11.5–15.5)
WBC: 11.5 10*3/uL — ABNORMAL HIGH (ref 4.0–10.5)
nRBC: 0 % (ref 0.0–0.2)

## 2021-06-29 LAB — GLUCOSE, CAPILLARY
Glucose-Capillary: 101 mg/dL — ABNORMAL HIGH (ref 70–99)
Glucose-Capillary: 119 mg/dL — ABNORMAL HIGH (ref 70–99)
Glucose-Capillary: 136 mg/dL — ABNORMAL HIGH (ref 70–99)
Glucose-Capillary: 216 mg/dL — ABNORMAL HIGH (ref 70–99)
Glucose-Capillary: 83 mg/dL (ref 70–99)
Glucose-Capillary: 96 mg/dL (ref 70–99)

## 2021-06-29 LAB — URINALYSIS, ROUTINE W REFLEX MICROSCOPIC
Bilirubin Urine: NEGATIVE
Glucose, UA: NEGATIVE mg/dL
Hgb urine dipstick: NEGATIVE
Ketones, ur: NEGATIVE mg/dL
Leukocytes,Ua: NEGATIVE
Nitrite: NEGATIVE
Protein, ur: NEGATIVE mg/dL
Specific Gravity, Urine: 1.014 (ref 1.005–1.030)
pH: 5 (ref 5.0–8.0)

## 2021-06-29 LAB — COMPREHENSIVE METABOLIC PANEL
ALT: 36 U/L (ref 0–44)
AST: 24 U/L (ref 15–41)
Albumin: 2.9 g/dL — ABNORMAL LOW (ref 3.5–5.0)
Alkaline Phosphatase: 58 U/L (ref 38–126)
Anion gap: 7 (ref 5–15)
BUN: 11 mg/dL (ref 6–20)
CO2: 31 mmol/L (ref 22–32)
Calcium: 9.4 mg/dL (ref 8.9–10.3)
Chloride: 101 mmol/L (ref 98–111)
Creatinine, Ser: 0.71 mg/dL (ref 0.44–1.00)
GFR, Estimated: >60 mL/min (ref >60)
Glucose, Bld: 79 mg/dL (ref 70–99)
Potassium: 3.4 mmol/L — ABNORMAL LOW (ref 3.5–5.1)
Sodium: 139 mmol/L (ref 135–145)
Total Bilirubin: 0.6 mg/dL (ref 0.3–1.2)
Total Protein: 5.6 g/dL — ABNORMAL LOW (ref 6.5–8.1)

## 2021-06-29 LAB — RPR: RPR Ser Ql: NONREACTIVE

## 2021-06-29 MED ORDER — CLONAZEPAM 0.5 MG PO TABS
0.5000 mg | ORAL_TABLET | Freq: Two times a day (BID) | ORAL | Status: DC | PRN
  Administered 2021-07-16: 0.5 mg via ORAL
  Filled 2021-06-29: qty 1

## 2021-06-29 MED ORDER — POTASSIUM CHLORIDE CRYS ER 20 MEQ PO TBCR: 20.0000 meq | EXTENDED_RELEASE_TABLET | Freq: Every day | ORAL | Status: DC

## 2021-06-29 MED ORDER — SODIUM CHLORIDE 0.9 % IV SOLN: INTRAVENOUS | Status: DC | PRN

## 2021-06-29 MED ORDER — POTASSIUM CHLORIDE CRYS ER 20 MEQ PO TBCR
20.0000 meq | EXTENDED_RELEASE_TABLET | Freq: Every day | ORAL | Status: DC
  Administered 2021-06-30 – 2021-07-02 (×3): 20 meq via ORAL
  Filled 2021-06-29 (×3): qty 1

## 2021-06-29 MED ORDER — TOPIRAMATE 25 MG PO TABS
25.0000 mg | ORAL_TABLET | Freq: Every day | ORAL | Status: DC
  Administered 2021-06-29 – 2021-07-03 (×5): 25 mg via ORAL
  Filled 2021-06-29 (×5): qty 1

## 2021-06-29 MED ORDER — POTASSIUM CHLORIDE 20 MEQ PO PACK
60.0000 meq | PACK | Freq: Once | ORAL | Status: AC
  Administered 2021-06-29: 60 meq via ORAL
  Filled 2021-06-29: qty 3

## 2021-06-29 NOTE — Progress Notes (Signed)
PROGRESS NOTE   Subjective/Complaints: No complaints this morning Husband requests that klonopin not be given before therapy as it sedates her Asks regarding how her cluster headaches will be managed  ROS: +left sided weakness   Objective:   MR BRAIN WO CONTRAST  Result Date: 06/27/2021 CLINICAL DATA:  Neuro deficit, acute, stroke suspected. EXAM: MRI HEAD WITHOUT CONTRAST TECHNIQUE: Multiplanar, multiecho pulse sequences of the brain and surrounding structures were obtained without intravenous contrast. COMPARISON:  MRI of the brain June 24, 2021. FINDINGS: Brain: Area of restricted diffusion involving the right side of the pons, consistent with subacute infarct, unchanged from prior MRI. No new focus of restricted diffusion to suggest new acute infarct. No hemorrhage, hydrocephalus, extra-axial collection or mass lesion. Area of encephalomalacia and gliosis in the right parietal and occipital lobes with associated cortical laminar necrosis, unchanged. Scattered foci of T2 hyperintensity are seen within the white matter of the cerebral hemispheres, nonspecific. Vascular: Normal flow voids. Skull and upper cervical spine: Normal marrow signal. Sinuses/Orbits: Negative. Other: None. IMPRESSION: 1. No new acute infarct since prior MRI. 2. Subacute infarct on the right side of the pons, similar to prior MRI. 3. Area of encephalomalacia and gliosis in the right parietooccipital region with associated cortical laminar necrosis, unchanged. 4. Small amount of nonspecific T2 hyperintense lesions of the white matter. Differential diagnosis include chronic microangiopathy, vasculitis and post inflammatory/infectious processes. Electronically Signed   By: Pedro Earls M.D.   On: 06/27/2021 12:47   Recent Labs    06/28/21 0605 06/29/21 0532  WBC 10.3 11.5*  HGB 12.3 12.3  HCT 36.3 37.8  PLT 312 318   Recent Labs    06/28/21 0605  06/29/21 0532  NA 141 139  K 3.2* 3.4*  CL 102 101  CO2 31 31  GLUCOSE 83 79  BUN 13 11  CREATININE 0.57 0.71  CALCIUM 9.4 9.4    Intake/Output Summary (Last 24 hours) at 06/29/2021 1130 Last data filed at 06/29/2021 0827 Gross per 24 hour  Intake 300 ml  Output 500 ml  Net -200 ml        Physical Exam: Vital Signs Blood pressure 126/72, pulse 61, temperature 98.3 F (36.8 C), temperature source Oral, resp. rate 18, height 5\' 6"  (1.676 m), SpO2 94 %. Gen: no distress, normal appearing HEENT: oral mucosa pink and moist, NCAT Cardio: Reg rate Chest: normal effort, normal rate of breathing Abd: soft, non-distended Ext: no edema Psych: pleasant, flat affect Skin: intact Neurological:    Mental Status: She is alert and oriented to person, place, and time.    Comments: Left facial weakness with mild dysarthria. Able to answer orientation questions and follow commands without difficulty. Dense left hemiplegia.     Assessment/Plan: 1. Functional deficits which require 3+ hours per day of interdisciplinary therapy in a comprehensive inpatient rehab setting. Physiatrist is providing close team supervision and 24 hour management of active medical problems listed below. Physiatrist and rehab team continue to assess barriers to discharge/monitor patient progress toward functional and medical goals  Care Tool:  Bathing              Bathing assist  Upper Body Dressing/Undressing Upper body dressing   What is the patient wearing?: Pull over shirt    Upper body assist Assist Level: Minimal Assistance - Patient > 75%    Lower Body Dressing/Undressing Lower body dressing      What is the patient wearing?: Pants     Lower body assist Assist for lower body dressing: Minimal Assistance - Patient > 75%     Toileting Toileting    Toileting assist Assist for toileting: Minimal Assistance - Patient > 75%     Transfers Chair/bed transfer  Transfers assist      Chair/bed transfer assist level: Moderate Assistance - Patient 50 - 74% (stedy)     Locomotion Ambulation   Ambulation assist              Walk 10 feet activity   Assist           Walk 50 feet activity   Assist           Walk 150 feet activity   Assist           Walk 10 feet on uneven surface  activity   Assist           Wheelchair     Assist               Wheelchair 50 feet with 2 turns activity    Assist            Wheelchair 150 feet activity     Assist          Blood pressure 126/72, pulse 61, temperature 98.3 F (36.8 C), temperature source Oral, resp. rate 18, height 5\' 6"  (1.676 m), SpO2 94 %.    Medical Problem List and Plan: 1.  Right pontine stroke             -patient may shower             -ELOS/Goals: 2-3 weeks S             -husband may come in before 8am  -check vitamin D level tomorrow.  2.  Impaired mobility: -DVT/anticoagulation:  Pharmaceutical: Lovenox             -antiplatelet therapy: Continue ASA/Plavix 3. Cluster headaches: abortive oxygen PRN. Oxycodone prn for HA 4. Mood: Team to provide ego support. LCSW to follow for evaluation and support.             -antipsychotic agents: Lamictal daily. 5. Neuropsych: This patient is capable of making decisions on her own behalf. 6. Skin/Wound Care: Routine pressure relief measures. 7. Fluids/Electrolytes/Nutrition: Monitor I/O. Check lytes in am. 8. HTN: SBP goal 140-180 range to avoid hypoperfusion of brain. Per husband "acts drunk when BP is low" --Continue Imdur, Coreg, aldactone, Demadex, Per reports 9. T2DM: Hgb A1C- 10.7. BS run 200's at home. Dietary education/reinforcement.             --Continue Lantus  45 units BID with SSI for elevated BS             --titrate insulin as indicated. 10. CAD s/p CABG: On ASA, coreg, Cozaar, Demadex, ASA, 11. PAD w/claudication: Has been smoking 1 PPD --quit at admission. Continue Nicotine  patch. --On lyrica BID to manage symptoms.   12. Aspiration PNA: Unasyn D#4/ 5-7. Monitor for symptoms, WBC trend.             --Leucocytosis resolving 18.6-->10.3 13. Chronic HA: Managed with thorazine as well as  oxycodone and high flow oxygen prn. 14. H/o depression/anxiety: Continue Lamictal, Prozac with Klonopin prn. 15. Chronic insomnia: Managed with home dose trazodone. 16. Dyslipidemia: Trig- 636, direct LDL-99.6, HDL 29, Chol 202. On Zetia--to start Vespc 17. Vitamin B 12 deficiency: B12- 169 (was WNL a year ago) 18. NASH/Fatty liver: Recheck LFTs in am.  19. Dense left sided hemiplegia: [place] regarding NMES.  20. Hypokalemia: supplement 48meq today and repeat BMP tomorrow.  21. Leukocytosis: trending back upward: check UA/UC, repeat tomorrow.   LOS: 1 days A FACE TO FACE EVALUATION WAS PERFORMED  Sharnise Blough P Shelley Cocke 06/29/2021, 11:30 AM

## 2021-06-29 NOTE — Evaluation (Signed)
Speech Language Pathology Assessment and Plan  Patient Details  Name: Erika [name] MRN: [id number] Date of Birth: 1971-05-25  SLP Diagnosis: Dysarthria;Dysphagia;Cognitive Impairments  Rehab Potential: Good ELOS: 3-3.5 weeks    Today's Date: 06/29/2021 SLP Individual Time: 1100-1200 SLP Individual Time Calculation (min): 60 min   Hospital Problem: Principal Problem:   Right pontine stroke [facility])  Past Medical History:  Past Medical History:  Diagnosis Date   Arthralgia of temporomandibular joint    CAD, multiple vessel    a. 06/2016 Cath: ostLM 40%, ostLAD 40%, pLAD 95%, ost-pLCx 60%, pLCx 95%, mLCx 60%, mRCA 95%, D2 50%, LVSF nl;  b. 07/2016 CABG x 4 (LIMA->LAD, VG->Diag, VG->OM, VG->RCA); c. 08/2016 Cath: 3VD w/ 4/4 patent grafts. LAD distal to LIMA has diff dzs->Med rx; d. 08/2020 Cath: 4/4 patent grafts, native 3VD. EF 55-65%-->Med Rx.   Carotid arterial disease ([place])    a. 07/2016 s/p R CEA; b. 02/2021 U/S: RICA 78-29%, LICA 5-62%.   Clotting disorder ([place])    Depression    Diastolic dysfunction    a. 06/2016 Echo: EF 50-55%, mild inf wall HK, GR1DD, mild MR, RV sys fxn nl, mildly dilated LA, PASP nl   Fatty liver disease, nonalcoholic 1308   History of blood transfusion    with heart surgery   HLD (hyperlipidemia)    Labile hypertension    a. prior renal ngiogram negative for RAS in 03/2016; b. catecholamines and metanephrines normal, mildly elevated renin with normal aldosterone and normal ratio in 02/2016   Myocardial infarction [facility]) 2017   Obesity    PAD (peripheral artery disease) ([place])    a. 09/2018 s/p L SFA stenting; b. 07/2019 Periph Angio: Patent m/d L SFA stent w/ 100% L SFA distal to stent. L AT 100d, L Peroneal diff dzs-->Med Rx; c. 02/2021 ABIs: stable @ 0.61 on R and 0.46 on L.   PTSD (post-traumatic stress disorder)    Tobacco abuse    Type 2 diabetes mellitus ([place]) 12/2015   Past Surgical History:  Past Surgical History:  Procedure Laterality Date    ABDOMINAL AORTOGRAM W/LOWER EXTREMITY [name] 10/15/2018   Procedure: ABDOMINAL AORTOGRAM W/LOWER EXTREMITY;  Surgeon: Wellington Hampshire, MD;  Location: [place] [facility];  Service: Cardiovascular;  Laterality: [name];   ABDOMINAL AORTOGRAM W/LOWER EXTREMITY Bilateral 08/19/2019   Procedure: ABDOMINAL AORTOGRAM W/LOWER EXTREMITY;  Surgeon: Wellington Hampshire, MD;  Location: [place] [facility];  Service: Cardiovascular;  Laterality: Bilateral;   CARDIAC CATHETERIZATION [name] 06/29/2016   Procedure: Left Heart Cath and Coronary Angiography;  Surgeon: Minna Merritts, MD;  Location: [place] [facility];  Service: Cardiovascular;  Laterality: [name];   CARDIAC CATHETERIZATION [name] 08/29/2016   Procedure: Left Heart Cath and Cors/Grafts Angiography;  Surgeon: Wellington Hampshire, MD;  Location: [place] [facility];  Service: Cardiovascular;  Laterality: [name];   CESAREAN SECTION     CHOLECYSTECTOMY     CORONARY ARTERY BYPASS GRAFT [name] 07/06/2016   Procedure: CORONARY ARTERY BYPASS GRAFTING (CABG) x four, using left internal mammary artery and right leg greater saphenous vein harvested endoscopically;  Surgeon: Ivin Poot, MD;  Location: [place];  Service: Open Heart Surgery;  Laterality: [name];   ENDARTERECTOMY Right 07/06/2016   Procedure: ENDARTERECTOMY CAROTID;  Surgeon: Rosetta Posner, MD;  Location: [facility];  Service: Vascular;  Laterality: Right;   ENDARTERECTOMY Right 04/27/2020   Procedure: REDO OF RIGHT ENDARTERECTOMY CAROTID;  Surgeon: Rosetta Posner, MD;  Location: [place];  Service: Vascular;  Laterality: Right;   LEFT HEART CATH AND CORS/GRAFTS ANGIOGRAPHY [name] 08/24/2020   Procedure: LEFT HEART CATH AND CORS/GRAFTS ANGIOGRAPHY;  Surgeon: Wellington Hampshire, MD;  Location: [place] [facility];  Service: Cardiovascular;  Laterality: [name];   PERIPHERAL VASCULAR CATHETERIZATION [name] 04/18/2016   Procedure: Renal Angiography;  Surgeon: Wellington Hampshire, MD;  Location: [place] [facility];  Service: Cardiovascular;  Laterality: [name];    PERIPHERAL VASCULAR INTERVENTION Left 10/15/2018   Procedure: PERIPHERAL VASCULAR INTERVENTION;  Surgeon: Wellington Hampshire, MD;  Location: [place] [facility];  Service: Cardiovascular;  Laterality: Left;  Left superficial femoral   TEE WITHOUT CARDIOVERSION [name] 07/06/2016   Procedure: TRANSESOPHAGEAL ECHOCARDIOGRAM (TEE);  Surgeon: Ivin Poot, MD;  Location: [place];  Service: Open Heart Surgery;  Laterality: [name];   TONSILLECTOMY      Assessment / Plan / Recommendation  Pt is a 50 y/o  woman with past medical history significant for HTN, HLD, DM, tobacco abuse, PAD, CAD, CABG, dCHF, peptic foot drop, renal artery stenosis, CAD (s/p of R CEA 2017), PTSD, adjustment disorder, MI, obesity admitted on 06/24/21 with c/c of slurred speech, L facial droop & LUE weakness. MRI revealed acute R pontine perforator infarct with chronic R parietal & occipital infarcts. During admission, she was also found to have acute respiratory failure 2/2 RUL pneumonia and started on Roecephin/Doxy/Unasyn. Pt. Also with cluster headaches, husband reports she sometimes needs 15L of oxygen at home when these occur. She has orders for NRB PRN 100% 02  Patient transferred to CIR on 06/28/2021 .    Clinical Impression Patient presents with a mild cognitive impairment, mild-moderate flaccid dysarthria and a mild oropharyngeal dysphagia. SLP administered SLUMS [facility] Mental Status exam) and patient received a score of 25, placing her in range (21-26) for Mild Neurocognitive Disorder. Main areas of cognitive impairment include: memory, attention, mild complex level problem solving. Patient's husband stated he has noticed that she has had some delirium, confusion and "sentences getting jumbled up in her head". Patient's dysarthria is most pronounced during conversation and consists of imprecise, mainly lingual consonants. Patient is aware of dysarthria but not yet consistently using speech strategies to slow down speech  rate, overarticulate. Patient exhibited immediate dry coughing after sips of water through straw but when patient instructed to take small sips through straw, she was able to perform and did not then exhibit any coughing. She exhibited mild oral phase swallow impairment as well, with decreased mastication (also with impact from ill fitting dentures). Patient reports she is motivated to improve and get home as she is caregiver for her 46 y.o. daughter who has "Asperger's" as well as stage IV kidney disease on transplant list. She will benefit from skilled SLP intervention to improve cognition, swallow and speech abilities.   Skilled Therapeutic Interventions          BSE, SLE< SLUMS assessment  SLP Assessment  Patient will need skilled Speech Lanaguage Pathology Services during CIR admission    Recommendations  SLP Diet Recommendations: Dysphagia 3 (Mech soft);Thin Liquid Administration via: Cup;Straw Medication Administration: Crushed with puree Supervision: Patient able to self feed;Intermittent supervision to cue for compensatory strategies Compensations: Minimize environmental distractions;Small sips/bites;Slow rate Postural Changes and/or Swallow Maneuvers: Seated upright 90 degrees Oral Care Recommendations: Oral care BID;Patient independent with oral care Recommendations for Other Services: Neuropsych consult Patient destination: Home Follow up Recommendations: Other (comment) (TBD pending progress)    SLP Frequency 3 to 5 out of 7 days  SLP Duration  SLP Intensity  SLP Treatment/Interventions 3-3.5 weeks  Minumum of 1-2 x/day, 30 to 90 minutes  Cognitive remediation/compensation;Dysphagia/aspiration precaution training;Medication managment;Patient/family education;Functional tasks;Speech/Language facilitation    Pain Pain Assessment Pain Scale: 0-10 Pain Score: 0-No pain  Prior Functioning Cognitive/Linguistic Baseline: Within functional limits Type of Home: House  Lives  With: Spouse Available Help at Discharge: Family;Available 24 hours/day Education: graduated HS and took 2 years college Vocation: On disability  SLP Evaluation Cognition Overall Cognitive Status: Impaired/Different from baseline Arousal/Alertness: Awake/alert Orientation Level: Oriented X4 Attention: Selective Focused Attention: Appears intact Sustained Attention: Appears intact Selective Attention: Impaired Selective Attention Impairment: Verbal basic;Verbal complex Memory: Impaired Memory Impairment: Retrieval deficit;Other (comment) (recalled 4/5 words after 2 minute delay) Awareness: Appears intact Problem Solving: Impaired Problem Solving Impairment: Verbal complex;Functional complex Organizing: Impaired Organizing Impairment: Verbal complex Safety/Judgment: Impaired Comments: Slightly impulsive, decreased processing speed  Comprehension Auditory Comprehension Overall Auditory Comprehension: Appears within functional limits for tasks assessed Expression Verbal Expression Overall Verbal Expression: Appears within functional limits for tasks assessed Oral Motor Oral Motor/Sensory Function Overall Oral Motor/Sensory Function: Mild impairment Facial ROM: Reduced left;Suspected CN VII (facial) dysfunction Facial Symmetry: Abnormal symmetry left;Suspected CN VII (facial) dysfunction Facial Strength: Reduced left;Suspected CN VII (facial) dysfunction Facial Sensation: Within Functional Limits Lingual ROM: Reduced left;Suspected CN XII (hypoglossal) dysfunction Lingual Symmetry: Abnormal symmetry left;Suspected CN XII (hypoglossal) dysfunction Lingual Strength: Reduced Velum: Within Functional Limits Mandible: Within Functional Limits Motor Speech Overall Motor Speech: Impaired Respiration: Within functional limits Phonation: Normal Resonance: Within functional limits Articulation: Impaired Level of Impairment: Phrase Intelligibility: Intelligibility reduced Word:  75-100% accurate Phrase: 75-100% accurate Sentence: 75-100% accurate Conversation: 50-74% accurate Motor Planning: Witnin functional limits Interfering Components: Inadequate dentition (loose fitting dentures) Effective Techniques: Slow rate;Over-articulate  Care Tool Care Tool Cognition Expression of Ideas and Wants Expression of Ideas and Wants: Some difficulty - exhibits some difficulty with expressing needs and ideas (e.g, some words or finishing thoughts) or speech is not clear   Understanding Verbal and Non-Verbal Content Understanding Verbal and Non-Verbal Content: Understands (complex and basic) - clear comprehension without cues or repetitions   Memory/Recall Ability *first 3 days only Memory/Recall Ability *first 3 days only: Current season;Location of own room;That he or she is in a hospital/hospital unit     Intelligibility: Intelligibility reduced Word: 75-100% accurate Phrase: 75-100% accurate Sentence: 75-100% accurate Conversation: 50-74% accurate  Bedside Swallowing Assessment General Date of Onset: 06/24/21 Previous Swallow Assessment: BSE 7/2 Diet Prior to this Study: Dysphagia 3 (soft);Thin liquids Temperature Spikes Noted: No Respiratory Status: Room air History of Recent Intubation: No Behavior/Cognition: Alert;Cooperative;Pleasant mood Oral Cavity - Dentition: Edentulous;Dentures, bottom;Dentures, top Self-Feeding Abilities: Able to feed self Vision: Functional for self-feeding Patient Positioning: Upright in bed Baseline Vocal Quality: Normal Volitional Cough: Strong Volitional Swallow: Able to elicit  Oral Care Assessment   Ice Chips   Thin Liquid Thin Liquid: Impaired Presentation: Straw;Cup;Self Fed Pharyngeal  Phase Impairments: Cough - Immediate Other Comments: no coughing with controlled cup or straw sips Nectar Thick   Honey Thick   Puree   Solid Solid: Impaired Oral Phase Impairments: Impaired mastication Oral Phase Functional  Implications: Impaired mastication BSE Assessment Risk for Aspiration Impact on safety and function: Mild aspiration risk  Short Term Goals: Week 1: SLP Short Term Goal 1 (Week 1): Patient will tolerate Dys 3 solids, thin liquids without overt s/s aspiration or penetration and with consistent following of swallow precautions (small sips via straw or cup). SLP Short Term Goal 2 (  Week 1): Patient will tolerate trials of regular solids textures with minimal cues for clearing oral cavity and checking for left sided oral pocketing. SLP Short Term Goal 3 (Week 1): Patient will utilize speech strategies (slow down rate, overarticulate) in structured conversation with min-modA cues. SLP Short Term Goal 4 (Week 1): Patient will perfrom mildly complex problem solving tasks in mildly distracting environments with modA cues to redirect. SLP Short Term Goal 5 (Week 1): Patient will demonstrate daily recall of specific medical and therapeutic interventions (exercises, strategies, etc.) with minA cues.  Refer to Care Plan for Long Term Goals  Recommendations for other services: Neuropsych  Discharge Criteria: Patient will be discharged from SLP if patient refuses treatment 3 consecutive times without medical reason, if treatment goals not met, if there is a change in medical status, if patient makes no progress towards goals or if patient is discharged from hospital.  The above assessment, treatment plan, treatment alternatives and goals were discussed and mutually agreed upon: by patient and by family  Sonia Baller, MA, CCC-SLP Speech Therapy

## 2021-06-29 NOTE — Plan of Care (Addendum)
Nutrition Education Note  RD consulted for nutrition diet education.   Lipid Panel     Component Value Date/Time   CHOL 202 (H) 06/25/2021 0740   CHOL 121 08/21/2016 0817   TRIG 636 (H) 06/25/2021 0740   HDL 29 (L) 06/25/2021 0740   HDL 24 (L) 08/21/2016 0817   CHOLHDL 7.0 06/25/2021 0740   VLDL UNABLE TO CALCULATE IF TRIGLYCERIDE OVER 400 mg/dL 06/25/2021 0740   LDLCALC UNABLE TO CALCULATE IF TRIGLYCERIDE OVER 400 mg/dL 06/25/2021 0740   LDLCALC 64 08/21/2016 0817    RD provided "Heart Healthy Consistent Carbohydrate Nutrition Therapy" handout from the Academy of [employer]. Husband at bedside. Pt asleep and did not awaken to RD visit. Husband reports well educated on heart healthy and carbohydrate modified diet. Provided list of carbohydrates and recommended serving sizes of common foods. List of examples on ways to decrease sodium and fat intake in diet given. Discouraged intake of processed foods and use of salt shaker. List of food recommended and not recommended provided. Husband reports understanding of information presented.  Expect good compliance.  Current diet order is dysphagia 3 diet with thin liquids, patient is consuming approximately 50-100% of meals at this time. Intake and appetite has improved. Labs and medications reviewed. No further nutrition interventions warranted at this time. RD contact information provided. If additional nutrition issues arise, please re-consult RD.  Corrin Parker, MS, RD, LDN RD pager number/after hours weekend pager number on Amion.

## 2021-06-29 NOTE — Evaluation (Signed)
Occupational Therapy Assessment and Plan  Patient Details  Name: Erika [name] MRN: [id number] Date of Birth: 07/15/1971  OT Diagnosis: cognitive deficits, flaccid hemiplegia and hemiparesis, and hemiplegia affecting non-dominant side Rehab Potential: Rehab Potential (ACUTE ONLY): Good ELOS: 21-23 days   Today's Date: 06/29/2021 OT Individual Time: 2585-2778 OT Individual Time Calculation (min): 60 min     Hospital Problem: Principal Problem:   Right pontine stroke [facility])   Past Medical History:  Past Medical History:  Diagnosis Date   Arthralgia of temporomandibular joint    CAD, multiple vessel    a. 06/2016 Cath: ostLM 40%, ostLAD 40%, pLAD 95%, ost-pLCx 60%, pLCx 95%, mLCx 60%, mRCA 95%, D2 50%, LVSF nl;  b. 07/2016 CABG x 4 (LIMA->LAD, VG->Diag, VG->OM, VG->RCA); c. 08/2016 Cath: 3VD w/ 4/4 patent grafts. LAD distal to LIMA has diff dzs->Med rx; d. 08/2020 Cath: 4/4 patent grafts, native 3VD. EF 55-65%-->Med Rx.   Carotid arterial disease ([place])    a. 07/2016 s/p R CEA; b. 02/2021 U/S: RICA 24-23%, LICA 5-36%.   Clotting disorder ([place])    Depression    Diastolic dysfunction    a. 06/2016 Echo: EF 50-55%, mild inf wall HK, GR1DD, mild MR, RV sys fxn nl, mildly dilated LA, PASP nl   Fatty liver disease, nonalcoholic 1443   History of blood transfusion    with heart surgery   HLD (hyperlipidemia)    Labile hypertension    a. prior renal ngiogram negative for RAS in 03/2016; b. catecholamines and metanephrines normal, mildly elevated renin with normal aldosterone and normal ratio in 02/2016   Myocardial infarction [facility]) 2017   Obesity    PAD (peripheral artery disease) ([place])    a. 09/2018 s/p L SFA stenting; b. 07/2019 Periph Angio: Patent m/d L SFA stent w/ 100% L SFA distal to stent. L AT 100d, L Peroneal diff dzs-->Med Rx; c. 02/2021 ABIs: stable @ 0.61 on R and 0.46 on L.   PTSD (post-traumatic stress disorder)    Tobacco abuse    Type 2 diabetes mellitus ([place]) 12/2015   Past  Surgical History:  Past Surgical History:  Procedure Laterality Date   ABDOMINAL AORTOGRAM W/LOWER EXTREMITY [name] 10/15/2018   Procedure: ABDOMINAL AORTOGRAM W/LOWER EXTREMITY;  Surgeon: Wellington Hampshire, MD;  Location: [place] CV LAB;  Service: Cardiovascular;  Laterality: [name];   ABDOMINAL AORTOGRAM W/LOWER EXTREMITY Bilateral 08/19/2019   Procedure: ABDOMINAL AORTOGRAM W/LOWER EXTREMITY;  Surgeon: Wellington Hampshire, MD;  Location: [place] [facility];  Service: Cardiovascular;  Laterality: Bilateral;   CARDIAC CATHETERIZATION [name] 06/29/2016   Procedure: Left Heart Cath and Coronary Angiography;  Surgeon: Minna Merritts, MD;  Location: [place] [facility];  Service: Cardiovascular;  Laterality: [name];   CARDIAC CATHETERIZATION [name] 08/29/2016   Procedure: Left Heart Cath and Cors/Grafts Angiography;  Surgeon: Wellington Hampshire, MD;  Location: [place] CV [facility];  Service: Cardiovascular;  Laterality: [name];   CESAREAN SECTION     CHOLECYSTECTOMY     CORONARY ARTERY BYPASS GRAFT [name] 07/06/2016   Procedure: CORONARY ARTERY BYPASS GRAFTING (CABG) x four, using left internal mammary artery and right leg greater saphenous vein harvested endoscopically;  Surgeon: Ivin Poot, MD;  Location: [place];  Service: Open Heart Surgery;  Laterality: [name];   ENDARTERECTOMY Right 07/06/2016   Procedure: ENDARTERECTOMY CAROTID;  Surgeon: Rosetta Posner, MD;  Location: [place];  Service: Vascular;  Laterality: Right;   ENDARTERECTOMY Right 04/27/2020   Procedure: REDO OF RIGHT ENDARTERECTOMY CAROTID;  Surgeon: Rosetta Posner, MD;  Location: [facility] OR;  Service: Vascular;  Laterality: Right;   LEFT HEART CATH AND CORS/GRAFTS ANGIOGRAPHY [name] 08/24/2020   Procedure: LEFT HEART CATH AND CORS/GRAFTS ANGIOGRAPHY;  Surgeon: Wellington Hampshire, MD;  Location: [place] CV LAB;  Service: Cardiovascular;  Laterality: [name];   PERIPHERAL VASCULAR CATHETERIZATION [name] 04/18/2016   Procedure: Renal Angiography;  Surgeon: Wellington Hampshire, MD;   Location: [place] CV LAB;  Service: Cardiovascular;  Laterality: [name];   PERIPHERAL VASCULAR INTERVENTION Left 10/15/2018   Procedure: PERIPHERAL VASCULAR INTERVENTION;  Surgeon: Wellington Hampshire, MD;  Location: [place] [facility];  Service: Cardiovascular;  Laterality: Left;  Left superficial femoral   TEE WITHOUT CARDIOVERSION [name] 07/06/2016   Procedure: TRANSESOPHAGEAL ECHOCARDIOGRAM (TEE);  Surgeon: Ivin Poot, MD;  Location: [place];  Service: Open Heart Surgery;  Laterality: [name];   TONSILLECTOMY      Assessment & Plan Clinical Impression:  Pt is a 50 y/o  woman with past medical history significant for HTN, HLD, DM, tobacco abuse, PAD, CAD, CABG, dCHF, peptic foot drop, renal artery stenosis, CAD (s/p of R CEA 2017), PTSD, adjustment disorder, MI, obesity admitted on 06/24/21 with c/c of slurred speech, L facial droop & LUE weakness. MRI revealed acute R pontine perforator infarct with chronic R parietal & occipital infarcts. During admission, she was also found to have acute respiratory failure 2/2 RUL pneumonia and started on Roecephin/Doxy/Unasyn. Pt. Also with cluster headaches, husband reports she sometimes needs 15L of oxygen at home when these occur. She has orders for NRB PRN 100% 02  Patient transferred to CIR on 06/28/2021 .    Patient currently requires max with basic self-care skills secondary to decreased cardiorespiratoy endurance, abnormal tone, decreased memory and delayed processing, and decreased sitting balance, decreased standing balance, decreased postural control, hemiplegia, and decreased balance strategies.  Prior to hospitalization, patient was fully independent.  Patient will benefit from skilled intervention to increase independence with basic self-care skills prior to discharge home with care partner.  Anticipate patient will require 24 hour supervision and follow up home health.  OT - End of Session Endurance Deficit: Yes Endurance Deficit Description: Requires  multiple rest breaks during mobility tasks OT Assessment Rehab Potential (ACUTE ONLY): Good OT Patient demonstrates impairments in the following area(s): Balance;Cognition;Endurance;Motor;Vision OT Basic ADL's Functional Problem(s): Bathing;Dressing;Toileting OT Transfers Functional Problem(s): Toilet;Tub/Shower OT Additional Impairment(s): Fuctional Use of Upper Extremity OT Plan OT Intensity: Minimum of 1-2 x/day, 45 to 90 minutes OT Frequency: 5 out of 7 days OT Duration/Estimated Length of Stay: 21-23 days OT Treatment/Interventions: Balance/vestibular training;Cognitive remediation/compensation;Discharge planning;DME/adaptive equipment instruction;Functional mobility training;Neuromuscular re-education;Psychosocial support;Patient/family education;Self Care/advanced ADL retraining;Therapeutic Activities;Therapeutic Exercise;UE/LE Strength taining/ROM;UE/LE Coordination activities;Visual/perceptual remediation/compensation OT Self Feeding Anticipated Outcome(s): independent OT Basic Self-Care Anticipated Outcome(s): CGA OT Toileting Anticipated Outcome(s): CGA OT Bathroom Transfers Anticipated Outcome(s): CGA OT Recommendation Recommendations for Other Services: Therapeutic Recreation consult Therapeutic Recreation Interventions: Pet therapy;Stress management Patient destination: Home Follow Up Recommendations: Outpatient OT Equipment Recommended: 3 in 1 bedside comode;To be determined   OT Evaluation Precautions/Restrictions  Precautions Precautions: Fall Precaution Comments: L hemiplegia Restrictions LUE Weight Bearing: Weight bearing as tolerated LLE Weight Bearing: Weight bearing as tolerated   Pain Pain Assessment Pain Score: 2  - headache - RN provided medication Home Living/Prior Functioning Home Living Family/patient expects to be discharged to:: Private residence Living Arrangements: Spouse/significant other, Children Available Help at Discharge: Family, Available  24 hours/day Type of Home: House Home Access: Stairs to enter  Entrance Stairs-Number of Steps: 4 Entrance Stairs-Rails: Right Home Layout: One level Bathroom Shower/Tub: Multimedia programmer: Standard  Lives With: Spouse Prior Function Level of Independence: Independent with transfers, Independent with homemaking with ambulation, Independent with gait, Independent with basic ADLs  Able to Take Stairs?: Yes Driving: Yes Comments: Pt independent at baseline, no AD, driving, helps to care for special needs daughter Vision Baseline Vision/History: Wears glasses Patient Visual Report: No change from baseline Vision Assessment?: Yes Eye Alignment: Within Functional Limits Ocular Range of Motion: Within Functional Limits Alignment/Gaze Preference: Within Defined Limits Tracking/Visual Pursuits: Decreased smoothness of eye movement to LEFT superior field;Decreased smoothness of eye movement to LEFT inferior field;Decreased smoothness of vertical tracking;Unable to hold eye position out of midline (to left side) Visual Fields: No apparent deficits Perception  Perception: Within Functional Limits Praxis Praxis: Intact Cognition Overall Cognitive Status: Within Functional Limits for tasks assessed Arousal/Alertness: Awake/alert Orientation Level: Person;Place;Situation Person: Oriented Place: Oriented Situation: Oriented Year: 2022 Month: July Day of Week: Correct Memory: Impaired Immediate Memory Recall: Sock;Blue;Bed Memory Recall Sock: Without Cue Memory Recall Blue: Without Cue Memory Recall Bed: Without Cue Attention: Focused;Sustained Safety/Judgment: Impaired Comments: Slightly impulsive, decreased processing speed Sensation Sensation Light Touch: Appears Intact Hot/Cold: Appears Intact Proprioception: Impaired by gross assessment Stereognosis: Impaired by gross assessment Coordination Gross Motor Movements are Fluid and Coordinated: No Fine Motor Movements  are Fluid and Coordinated: No Coordination and Movement Description: Dense L hemiplegia Motor  Motor Motor: Hemiplegia  Trunk/Postural Assessment  Cervical Assessment Cervical Assessment:  (forward head) Thoracic Assessment Thoracic Assessment:  (rounded shoulders) Lumbar Assessment Lumbar Assessment:  (posterior pelvic tilt) Postural Control Postural Control: Deficits on evaluation Righting Reactions: Delayed  Balance Balance Balance Assessed: Yes Static Sitting Balance Static Sitting - Balance Support: Feet supported;Bilateral upper extremity supported Static Sitting - Level of Assistance: 5: Stand by assistance Dynamic Sitting Balance Dynamic Sitting - Balance Support: Bilateral upper extremity supported;Feet supported Dynamic Sitting - Level of Assistance: 4: Min Insurance risk surveyor Standing - Balance Support: Bilateral upper extremity supported;During functional activity Static Standing - Level of Assistance: 3: Mod assist Dynamic Standing Balance Dynamic Standing - Balance Support: During functional activity Dynamic Standing - Level of Assistance: 2: Max assist Extremity/Trunk Assessment RUE Assessment RUE Assessment: Within Functional Limits LUE Assessment LUE Assessment: Exceptions to [facility] Passive Range of Motion (PROM) Comments: WFL Active Range of Motion (AROM) Comments: 0 General Strength Comments: 0/5 LUE Body System: Neuro Brunstrum levels for arm and hand: Arm;Hand Brunstrum level for arm: Stage I Presynergy Brunstrum level for hand: Stage I Flaccidity  Care Tool Care Tool Self Care Eating   Eating Assist Level: Supervision/Verbal cueing    Oral Care  Oral care, brush teeth, clean dentures activity did not occur:  (dentures only) Oral Care Assist Level: Set up assist    Bathing   Body parts bathed by patient: Chest;Abdomen;Front perineal area;Buttocks;Left arm;Right upper leg;Left upper leg;Face Body parts bathed by helper: Left  lower leg;Right lower leg;Right arm   Assist Level: Moderate Assistance - Patient 50 - 74%    Upper Body Dressing(including orthotics)   What is the patient wearing?: Pull over shirt   Assist Level: Moderate Assistance - Patient 50 - 74%    Lower Body Dressing (excluding footwear)   What is the patient wearing?: Pants Assist for lower body dressing: Maximal Assistance - Patient 25 - 49%    Putting on/Taking off footwear   What is the patient wearing?: Non-skid slipper socks Assist  for footwear: Maximal Assistance - Patient 25 - 49%       Care Tool Toileting Toileting activity   Assist for toileting: Maximal Assistance - Patient 25 - 49%     Care Tool Bed Mobility Roll left and right activity   Roll left and right assist level: Moderate Assistance - Patient 50 - 74%    Sit to lying activity        Lying to sitting edge of bed activity   Lying to sitting edge of bed assist level: Moderate Assistance - Patient 50 - 74%     Care Tool Transfers Sit to stand transfer   Sit to stand assist level: Moderate Assistance - Patient 50 - 74%    Chair/bed transfer   Chair/bed transfer assist level: Moderate Assistance - Patient 50 - 74%     Toilet transfer   Assist Level: Moderate Assistance - Patient 50 - 74%     Care Tool Cognition Expression of Ideas and Wants Expression of Ideas and Wants: Some difficulty - exhibits some difficulty with expressing needs and ideas (e.g, some words or finishing thoughts) or speech is not clear   Understanding Verbal and Non-Verbal Content Understanding Verbal and Non-Verbal Content: Understands (complex and basic) - clear comprehension without cues or repetitions   Memory/Recall Ability *first 3 days only Memory/Recall Ability *first 3 days only: Current season;Location of own room;Staff names and faces;That he or she is in a hospital/hospital unit    Refer to Care Plan for [place] 1 OT Short Term Goal 1 (Week  1): Pt will be able to complete toilet transfer squat pivot with min A. OT Short Term Goal 2 (Week 1): Pt will be able to stand with min A and pull pants over hips with min A. OT Short Term Goal 3 (Week 1): Pt will be able to don shirt with min A. OT Short Term Goal 4 (Week 1): Pt will be able to self mobilize LUE.  Recommendations for other services: Therapeutic Recreation  Pet therapy and Stress management   Skilled Therapeutic Intervention ADL ADL Eating: Set up Grooming: Setup Upper Body Bathing: Minimal assistance Lower Body Bathing: Moderate assistance Upper Body Dressing: Moderate assistance Lower Body Dressing: Maximal assistance Toileting: Maximal assistance Where Assessed-Toileting: Bedside Commode Toilet Transfer: Moderate assistance Toilet Transfer Method: Squat pivot Toilet Transfer Equipment: Drop arm bedside commode Mobility  Bed Mobility Bed Mobility: Supine to Sit;Sit to Supine Supine to Sit: Moderate Assistance - Patient 50-74% Sit to Supine: Moderate Assistance - Patient 50-74% Transfers Sit to Stand: Moderate Assistance - Patient 50-74% Stand to Sit: Moderate Assistance - Patient 50-74%  Pt seen for initial evaluation and ADL training. Pt's spouse also present.  Pt participated extremely well and stated she is going to be working hard because she is anxious to get home.  Pt worked on squat pivot transfers recliner to [facility] and back and multiple sit to stands during toileting, dressing, bathing.  Pt was feeling drowsy due to her medication.   Reviewed role of OT, discussed pt's goals and ELOS. Pt resting in recliner with spouse with her at end of session.    Discharge Criteria: Patient will be discharged from OT if patient refuses treatment 3 consecutive times without medical reason, if treatment goals not met, if there is a change in medical status, if patient makes no progress towards goals or if patient is discharged from hospital.  The above assessment,  treatment plan, treatment alternatives  and goals were discussed and mutually agreed upon: by patient and by family  Trey Gulbranson 06/29/2021, 12:23 PM

## 2021-06-29 NOTE — Evaluation (Signed)
Physical Therapy Assessment and Plan  Patient Details  Name: Erika [name] MRN: [id number] Date of Birth: 29-Nov-1971  PT Diagnosis: Abnormality of gait, Difficulty walking, Hemiplegia non-dominant, and Muscle weakness Rehab Potential: Good ELOS: 3-3.5 weeks   Today's Date: 06/29/2021 PT Individual Time: 2500-3704 PT Individual Time Calculation (min): 74 min    Hospital Problem: Principal Problem:   Right pontine stroke [facility])   Past Medical History:  Past Medical History:  Diagnosis Date   Arthralgia of temporomandibular joint    CAD, multiple vessel    a. 06/2016 Cath: ostLM 40%, ostLAD 40%, pLAD 95%, ost-pLCx 60%, pLCx 95%, mLCx 60%, mRCA 95%, D2 50%, LVSF nl;  b. 07/2016 CABG x 4 (LIMA->LAD, VG->Diag, VG->OM, VG->RCA); c. 08/2016 Cath: 3VD w/ 4/4 patent grafts. LAD distal to LIMA has diff dzs->Med rx; d. 08/2020 Cath: 4/4 patent grafts, native 3VD. EF 55-65%-->Med Rx.   Carotid arterial disease ([place])    a. 07/2016 s/p R CEA; b. 02/2021 U/S: RICA 88-89%, LICA 1-69%.   Clotting disorder ([place])    Depression    Diastolic dysfunction    a. 06/2016 Echo: EF 50-55%, mild inf wall HK, GR1DD, mild MR, RV sys fxn nl, mildly dilated LA, PASP nl   Fatty liver disease, nonalcoholic 4503   History of blood transfusion    with heart surgery   HLD (hyperlipidemia)    Labile hypertension    a. prior renal ngiogram negative for RAS in 03/2016; b. catecholamines and metanephrines normal, mildly elevated renin with normal aldosterone and normal ratio in 02/2016   Myocardial infarction [facility]) 2017   Obesity    PAD (peripheral artery disease) ([place])    a. 09/2018 s/p L SFA stenting; b. 07/2019 Periph Angio: Patent m/d L SFA stent w/ 100% L SFA distal to stent. L AT 100d, L Peroneal diff dzs-->Med Rx; c. 02/2021 ABIs: stable @ 0.61 on R and 0.46 on L.   PTSD (post-traumatic stress disorder)    Tobacco abuse    Type 2 diabetes mellitus ([place]) 12/2015   Past Surgical History:  Past Surgical History:   Procedure Laterality Date   ABDOMINAL AORTOGRAM W/LOWER EXTREMITY [name] 10/15/2018   Procedure: ABDOMINAL AORTOGRAM W/LOWER EXTREMITY;  Surgeon: Wellington Hampshire, MD;  Location: [place] [facility];  Service: Cardiovascular;  Laterality: [name];   ABDOMINAL AORTOGRAM W/LOWER EXTREMITY Bilateral 08/19/2019   Procedure: ABDOMINAL AORTOGRAM W/LOWER EXTREMITY;  Surgeon: Wellington Hampshire, MD;  Location: [place] [facility];  Service: Cardiovascular;  Laterality: Bilateral;   CARDIAC CATHETERIZATION [name] 06/29/2016   Procedure: Left Heart Cath and Coronary Angiography;  Surgeon: Minna Merritts, MD;  Location: [place] [facility];  Service: Cardiovascular;  Laterality: [name];   CARDIAC CATHETERIZATION [name] 08/29/2016   Procedure: Left Heart Cath and Cors/Grafts Angiography;  Surgeon: Wellington Hampshire, MD;  Location: [place] [facility];  Service: Cardiovascular;  Laterality: [name];   CESAREAN SECTION     CHOLECYSTECTOMY     CORONARY ARTERY BYPASS GRAFT [name] 07/06/2016   Procedure: CORONARY ARTERY BYPASS GRAFTING (CABG) x four, using left internal mammary artery and right leg greater saphenous vein harvested endoscopically;  Surgeon: Ivin Poot, MD;  Location: [place];  Service: Open Heart Surgery;  Laterality: [name];   ENDARTERECTOMY Right 07/06/2016   Procedure: ENDARTERECTOMY CAROTID;  Surgeon: Rosetta Posner, MD;  [facility] OR;  Service: Vascular;  Laterality: Right;   ENDARTERECTOMY Right 04/27/2020   Procedure: REDO OF RIGHT ENDARTERECTOMY CAROTID;  Surgeon: Rosetta Posner, MD;  Location: [facility] OR;  Service: Vascular;  Laterality: Right;   LEFT HEART CATH AND CORS/GRAFTS ANGIOGRAPHY [name] 08/24/2020   Procedure: LEFT HEART CATH AND CORS/GRAFTS ANGIOGRAPHY;  Surgeon: Arida, Muhammad A, MD;  Location: [facility];  Service: Cardiovascular;  Laterality: [name];   PERIPHERAL VASCULAR CATHETERIZATION [name] 04/18/2016   Procedure: Renal Angiography;  Surgeon: Muhammad A Arida, MD;  Location: [facility];  Service:  Cardiovascular;  Laterality: [name];   PERIPHERAL VASCULAR INTERVENTION Left 10/15/2018   Procedure: PERIPHERAL VASCULAR INTERVENTION;  Surgeon: Arida, Muhammad A, MD;  Location: [facility];  Service: Cardiovascular;  Laterality: Left;  Left superficial femoral   TEE WITHOUT CARDIOVERSION [name] 07/06/2016   Procedure: TRANSESOPHAGEAL ECHOCARDIOGRAM (TEE);  Surgeon: Peter Van Trigt, MD;  Location: [facility] OR;  Service: Open Heart Surgery;  Laterality: [name];   TONSILLECTOMY      Assessment & Plan Clinical Impression: Patient is a [name] with history of CAD s/p CABG, R-CEA X 2, T2DM, fatty liver, chronic pain, PTSD who was admitted on 06/24/21 with reports of left sided weakness and fall progressing to left facial weakness with dysarthria. CTA/CT perfusion was negative for core infarct or penumbra and showed probable subacute right occipital infarct with petechial hemorrhage.  MRI brain done revealing acute right pontine perforator infarct and area of encephalomalacia with gliosis in  right parietal and occipital lobes. ASA added to home dose plavix with recommendations for life long use due to severity of intracranial atherosclerosis.    CTA chest was suboptimal study but negative for PE and showed patchy densities in RUL. She was started on IV unasyn due to concerns of Aspiration PNA but continued to have leucocytosis therefore blood cultures done 07/03 and pending. Dr. Allred consulted due to elevated troponin and felt that it was likely due to demand ischemia in setting of stroke and no evidence of ACS.  Patient with long history of  poorly controlled BP and medications being adjusted.  2D echo showed EF 60-65% with no wall abnormality. MRI brain repeated on 07/05 due to neurological changes with dense left hemiplegia and was negative for progression or new changes. She was noted to be hypotensive the day before and worsening of symptoms felt to be due to hypotension and neurology recommended  SBP goal 48-180 range and work up ordered for reversible causes of delirium. She was started on Vitamin B12 and thiamine empirically.   Patient transferred to CIR on 06/28/2021 .   Patient currently requires mod with mobility secondary to muscle weakness, decreased cardiorespiratoy endurance, unbalanced muscle activation and decreased coordination, and decreased sitting balance, decreased standing balance, decreased postural control, hemiplegia, and decreased balance strategies.  Prior to hospitalization, patient was independent  with mobility and lived with Spouse in a House home.  Home access is 4Stairs to enter.  Patient will benefit from skilled PT intervention to maximize safe functional mobility, minimize fall risk, and decrease caregiver burden for planned discharge home with 24 hour supervision.  Anticipate patient will benefit from follow up HH at discharge.  PT - End of Session Activity Tolerance: Tolerates 30+ min activity with multiple rests Endurance Deficit: Yes Endurance Deficit Description: Requires multiple rest breaks during mobility tasks PT Assessment Rehab Potential (ACUTE/IP ONLY): Good PT Barriers to Discharge: Home environment access/layout PT Patient demonstrates impairments in the following area(s): Balance;Endurance;Motor;Pain;Safety PT Transfers Functional Problem(s): Bed Mobility;Bed to Chair;Car;Furniture PT Locomotion Functional Problem(s): Ambulation;Stairs PT Plan PT Intensity: Minimum of 1-2 x/day ,45 to   90 minutes PT Frequency: 5 out of 7 days PT Duration Estimated Length of Stay: 3-3.5 weeks PT Treatment/Interventions: Ambulation/gait training;Community reintegration;DME/adaptive equipment instruction;Neuromuscular re-education;Psychosocial support;Stair training;UE/LE Strength taining/ROM;Wheelchair propulsion/positioning;Discharge planning;Balance/vestibular training;Functional electrical stimulation;Pain management;Skin care/wound management;Therapeutic  Activities;UE/LE Coordination activities;Cognitive remediation/compensation;Disease management/prevention;Functional mobility training;Patient/family education;Splinting/orthotics;Therapeutic Exercise;Visual/perceptual remediation/compensation PT Transfers Anticipated Outcome(s): Supervision PT Locomotion Anticipated Outcome(s): Supervision PT Recommendation Recommendations for Other Services: Therapeutic Recreation consult Therapeutic Recreation Interventions: Stress management Follow Up Recommendations: Home health PT;24 hour supervision/assistance Patient destination: Home Equipment Recommended: To be determined   PT Evaluation Precautions/Restrictions Precautions Precautions: Fall Precaution Comments: L hemiplegia Restrictions LUE Weight Bearing: Weight bearing as tolerated LLE Weight Bearing: Weight bearing as tolerated General Chart Reviewed: Yes Family/Caregiver Present: Yes Vital Signs  Pain Pain Assessment Pain Score: 2  Home Living/Prior Functioning Home Living Available Help at Discharge: Family;Available 24 hours/day Type of Home: House Home Access: Stairs to enter [employer] of Steps: 4 Entrance Stairs-Rails: Right Home Layout: One level Bathroom Shower/Tub: Multimedia programmer: Standard  Lives With: Spouse Prior Function Level of Independence: Independent with transfers;Independent with homemaking with ambulation;Independent with gait;Independent with basic ADLs  Able to Take Stairs?: Yes Driving: Yes Comments: Pt independent at baseline, no AD, driving, helps to care for special needs daughter Vision/Perception  Vision - Assessment Eye Alignment: Within Functional Limits Ocular Range of Motion: Within Functional Limits Alignment/Gaze Preference: Within Defined Limits Tracking/Visual Pursuits: Decreased smoothness of eye movement to LEFT superior field;Decreased smoothness of eye movement to LEFT inferior field;Decreased smoothness of  vertical tracking;Unable to hold eye position out of midline (to left side) Perception Perception: Within Functional Limits Praxis Praxis: Intact  Cognition Overall Cognitive Status: Within Functional Limits for tasks assessed Arousal/Alertness: Awake/alert Orientation Level: Oriented X4 Attention: Focused;Sustained Memory: Impaired Immediate Memory Recall: Sock;Blue;Bed Memory Recall Sock: Without Cue Memory Recall Blue: Without Cue Memory Recall Bed: Without Cue Safety/Judgment: Impaired Comments: Slightly impulsive, decreased processing speed Sensation Sensation Light Touch: Appears Intact Hot/Cold: Appears Intact Proprioception: Impaired by gross assessment Stereognosis: Impaired by gross assessment Coordination Gross Motor Movements are Fluid and Coordinated: No Fine Motor Movements are Fluid and Coordinated: No Coordination and Movement Description: Dense L hemiplegia Motor  Motor Motor: Hemiplegia   Trunk/Postural Assessment  Cervical Assessment Cervical Assessment:  (forward head) Thoracic Assessment Thoracic Assessment:  (rounded shoulders) Lumbar Assessment Lumbar Assessment:  (posterior pelvic tilt) Postural Control Postural Control: Deficits on evaluation Righting Reactions: Delayed  Balance Balance Balance Assessed: Yes Static Sitting Balance Static Sitting - Balance Support: Feet supported;Bilateral upper extremity supported Static Sitting - Level of Assistance: 5: Stand by assistance Dynamic Sitting Balance Dynamic Sitting - Balance Support: Bilateral upper extremity supported;Feet supported Dynamic Sitting - Level of Assistance: 4: Min Insurance risk surveyor Standing - Balance Support: Bilateral upper extremity supported;During functional activity Static Standing - Level of Assistance: 3: Mod assist Dynamic Standing Balance Dynamic Standing - Balance Support: During functional activity Dynamic Standing - Level of Assistance: 2: Max  assist Extremity Assessment  RUE Assessment RUE Assessment: Within Functional Limits LUE Assessment LUE Assessment: Exceptions to [facility] Passive Range of Motion (PROM) Comments: WFL Active Range of Motion (AROM) Comments: 0 General Strength Comments: 0/5 LUE Body System: Neuro Brunstrum levels for arm and hand: Arm;Hand Brunstrum level for arm: Stage I Presynergy Brunstrum level for hand: Stage I Flaccidity RLE Assessment RLE Assessment: Within Functional Limits LLE Strength LLE Overall Strength: Deficits Left Hip Flexion: 1/5 Left Hip Extension: 2-/5 Left Hip ABduction: 2-/5 Left Hip ADduction: 2-/5 Left Knee Flexion:  0/5 Left Knee Extension: 0/5 Left Ankle Dorsiflexion: 0/5 Left Ankle Plantar Flexion: 0/5  Care Tool Care Tool Bed Mobility Roll left and right activity   Roll left and right assist level: Moderate Assistance - Patient 50 - 74%    Sit to lying activity   Sit to lying assist level: Moderate Assistance - Patient 50 - 74%    Lying to sitting edge of bed activity   Lying to sitting edge of bed assist level: Moderate Assistance - Patient 50 - 74%     Care Tool Transfers Sit to stand transfer   Sit to stand assist level: Moderate Assistance - Patient 50 - 74%    Chair/bed transfer   Chair/bed transfer assist level: Maximal Assistance - Patient 25 - 49%     Toilet transfer   Assist Level: Moderate Assistance - Patient 50 - 74%    Car transfer   Car transfer assist level: Moderate Assistance - Patient 50 - 74%      Care Tool Locomotion Ambulation   Assist level: 2 helpers Assistive device: Hand held assist (R hand rail) Max distance: 20'  Walk 10 feet activity   Assist level: 2 helpers Assistive device: Hand held assist (R hand rail)   Walk 50 feet with 2 turns activity Walk 50 feet with 2 turns activity did not occur: Safety/medical concerns      Walk 150 feet activity Walk 150 feet activity did not occur: Safety/medical concerns      Walk 10  feet on uneven surfaces activity Walk 10 feet on uneven surfaces activity did not occur: Safety/medical concerns      Stairs Stair activity did not occur: Safety/medical concerns        Walk up/down 1 step activity Walk up/down 1 step or curb (drop down) activity did not occur: Safety/medical concerns     Walk up/down 4 steps activity did not occuR: Safety/medical concerns  Walk up/down 4 steps activity      Walk up/down 12 steps activity Walk up/down 12 steps activity did not occur: Safety/medical concerns      Pick up small objects from floor Pick up small object from the floor (from standing position) activity did not occur: Safety/medical concerns      Wheelchair Will patient use wheelchair at discharge?: No          Wheel 50 feet with 2 turns activity      Wheel 150 feet activity        Refer to Care Plan for Long Term Goals  SHORT TERM GOAL WEEK 1 PT Short Term Goal 1 (Week 1): Pt will perform bed mobility with minA. PT Short Term Goal 2 (Week 1): Pt will perform sit to stand transfer with minA. PT Short Term Goal 3 (Week 1): Pt perform bed to chair transfer with minA. PT Short Term Goal 4 (Week 1): Pt will ambulate x25' with modA.  Recommendations for other services: Therapeutic Recreation  Stress management  Skilled Therapeutic Intervention  Evaluation completed (see details above and below) with education on PT POC and goals and individual treatment initiated with focus on bed mobility, balance, transfers, and ambulation. Pt received seated in chair and agrees to therapy. No complaint of pain. Sit to stand with modA and stand step transfer to [facility] with maxA due to difficulty with motor planning and body mechanics. WC transport to gym for time management. Pt performs car transfer with modA and squat pivot technique. Pt transfer to mat table with modA  and sit to supine with modA and PT management of L lower extremity. Pt positioned in hooklying and performing slight  internal and external rotation with L hip without physical assistance. Pt then performs x10 bridges with PT putting manual overpressure through L tibia to promote WB and NM feedback. Pt cued to perform L leg extension with PT supporting leg. Initially pt unable to activate muscle groups but with PT moving pt through PROM several times and providing quick stretch on hip extensors, pt demos trace activation of L leg extensor groups. Supine to sit with modA. Pt ambulates x20' in hallway with R hand rail and PT under L arm, providing totalA to manage L leg and block knee during stance phase. +2 WC follow for safety with PT providing modA overall. WC transport back to room. Pt performs squat pivot back to bed with modA. Sit to supine with modA. Pt left with alarm intact and all needs within reach.   Mobility Bed Mobility Bed Mobility: Supine to Sit;Sit to Supine Supine to Sit: Moderate Assistance - Patient 50-74% Sit to Supine: Moderate Assistance - Patient 50-74% Transfers Transfers: Sit to Stand;Stand to Sit;Stand Pivot Transfers;Squat Pivot Transfers Sit to Stand: Moderate Assistance - Patient 50-74% Stand to Sit: Moderate Assistance - Patient 50-74% Stand Pivot Transfers: Maximal Assistance - Patient 25 - 49% Stand Pivot Transfer Details: Verbal cues for precautions/safety;Verbal cues for sequencing;Tactile cues for posture;Tactile cues for sequencing;Tactile cues for placement;Tactile cues for initiation;Manual facilitation for weight shifting Squat Pivot Transfers: Maximal Assistance - Patient 25-49% Transfer (Assistive device): 1 person hand held assist Locomotion  Gait Ambulation: Yes Gait Assistance: 2 Helpers Gait Distance (Feet): 20 Feet Assistive device:  (Hall rail and PT under LUE) Gait Assistance Details: Tactile cues for posture;Tactile cues for sequencing;Verbal cues for precautions/safety;Verbal cues for sequencing;Manual facilitation for weight shifting;Tactile cues for weight  shifting Gait Gait: Yes Gait Pattern: Impaired Gait Pattern:  (Requires TotalA for management of LLE) Gait velocity: decreased Stairs / Additional Locomotion Stairs: No Wheelchair Mobility Wheelchair Mobility: No   Discharge Criteria: Patient will be discharged from PT if patient refuses treatment 3 consecutive times without medical reason, if treatment goals not met, if there is a change in medical status, if patient makes no progress towards goals or if patient is discharged from hospital.  The above assessment, treatment plan, treatment alternatives and goals were discussed and mutually agreed upon: by patient and by family  Breck Coons, PT, DPT 06/29/2021, 3:47 PM

## 2021-06-29 NOTE — H&P (Signed)
Physical Medicine and Rehabilitation Admission H&P    Chief Complaint  Patient presents with   Functional deficits due to stroke    HPI: Erika [name]. Sonn is a [name] with history of CAD s/p CABG, R-CEA X 2, T2DM, fatty liver, chronic pain, PTSD who was admitted on 06/24/21 with reports of left sided weakness and fall progressing to left facial weakness with dysarthria. CTA/CT perfusion was negative for core infarct or penumbra and showed probable subacute right occipital infarct with petechial hemorrhage.  MRI brain done revealing acute right pontine perforator infarct and area of encephalomalacia with gliosis in  right parietal and occipital lobes. ASA added to home dose plavix with recommendations for life long use due to severity of intracranial atherosclerosis.   CTA chest was suboptimal study but negative for PE and showed patchy densities in RUL. She was started on IV unasyn due to concerns of Aspiration PNA but continued to have leucocytosis therefore blood cultures done 07/03 and pending. Dr. Rayann Heman consulted due to elevated troponin and felt that it was likely due to demand ischemia in setting of stroke and no evidence of ACS.  Patient with long history of  poorly controlled BP and medications being adjusted.  2D echo showed EF 60-65% with no wall abnormality.  MRI brain repeated on 07/05 due to neurological changes with dense left hemiplegia and was negative for progression or new changes. She was noted to be hypotensive the day before and worsening of symptoms felt to be due to hypotension and neurology recommended SBP goal 140-180 range and work up ordered for reversible causes of delirium. She was started on Vitamin B12 and thiamine empirically. C/o left sided weakness.    Review of Systems  Constitutional:  Negative for chills and fever.  HENT:  Negative for hearing loss and tinnitus.   Eyes:  Negative for blurred vision and double vision.  Respiratory:   Negative for cough and shortness of breath.   Cardiovascular:  Positive for claudication (can walk 20-40 feet before BLE give out/hurt). Negative for chest pain and palpitations.  Gastrointestinal:  Positive for constipation and heartburn. Negative for nausea.  Genitourinary:  Negative for frequency and urgency.  Musculoskeletal:  Negative for back pain.  Skin:  Negative for itching and rash.  Neurological:  Positive for speech change, weakness and headaches (constant HA--cluster HA every other day). Negative for dizziness.  Psychiatric/Behavioral:  Negative for depression. The patient has insomnia (chronic). The patient is not nervous/anxious (+/- worse since stroke).     Past Medical History:  Diagnosis Date   Arthralgia of temporomandibular joint    CAD, multiple vessel    a. 06/2016 Cath: ostLM 40%, ostLAD 40%, pLAD 95%, ost-pLCx 60%, pLCx 95%, mLCx 60%, mRCA 95%, D2 50%, LVSF nl;  b. 07/2016 CABG x 4 (LIMA->LAD, VG->Diag, VG->OM, VG->RCA); c. 08/2016 Cath: 3VD w/ 4/4 patent grafts. LAD distal to LIMA has diff dzs->Med rx; d. 08/2020 Cath: 4/4 patent grafts, native 3VD. EF 55-65%-->Med Rx.   Carotid arterial disease ([place])    a. 07/2016 s/p R CEA; b. 02/2021 U/S: RICA 81-85%, LICA 6-31%.   Clotting disorder ([place])    Depression    Diastolic dysfunction    a. 06/2016 Echo: EF 50-55%, mild inf wall HK, GR1DD, mild MR, RV sys fxn nl, mildly dilated LA, PASP nl   Fatty liver disease, nonalcoholic 4970   History of blood transfusion    with heart surgery   HLD (hyperlipidemia)  Labile hypertension    a. prior renal ngiogram negative for RAS in 03/2016; b. catecholamines and metanephrines normal, mildly elevated renin with normal aldosterone and normal ratio in 02/2016   Myocardial infarction [facility]) 2017   Obesity    PAD (peripheral artery disease) ([place])    a. 09/2018 s/p L SFA stenting; b. 07/2019 Periph Angio: Patent m/d L SFA stent w/ 100% L SFA distal to stent. L AT 100d, L Peroneal diff  dzs-->Med Rx; c. 02/2021 ABIs: stable @ 0.61 on R and 0.46 on L.   PTSD (post-traumatic stress disorder)    Tobacco abuse    Type 2 diabetes mellitus ([place]) 12/2015    Past Surgical History:  Procedure Laterality Date   ABDOMINAL AORTOGRAM W/LOWER EXTREMITY [name] 10/15/2018   Procedure: ABDOMINAL AORTOGRAM W/LOWER EXTREMITY;  Surgeon: Wellington Hampshire, MD;  Location: [place] [facility];  Service: Cardiovascular;  Laterality: [name];   ABDOMINAL AORTOGRAM W/LOWER EXTREMITY Bilateral 08/19/2019   Procedure: ABDOMINAL AORTOGRAM W/LOWER EXTREMITY;  Surgeon: Wellington Hampshire, MD;  Location: [place] [facility];  Service: Cardiovascular;  Laterality: Bilateral;   CARDIAC CATHETERIZATION [name] 06/29/2016   Procedure: Left Heart Cath and Coronary Angiography;  Surgeon: Minna Merritts, MD;  Location: [place] [facility];  Service: Cardiovascular;  Laterality: [name];   CARDIAC CATHETERIZATION [name] 08/29/2016   Procedure: Left Heart Cath and Cors/Grafts Angiography;  Surgeon: Wellington Hampshire, MD;  Location: [place] [facility];  Service: Cardiovascular;  Laterality: [name];   CESAREAN SECTION     CHOLECYSTECTOMY     CORONARY ARTERY BYPASS GRAFT [name] 07/06/2016   Procedure: CORONARY ARTERY BYPASS GRAFTING (CABG) x four, using left internal mammary artery and right leg greater saphenous vein harvested endoscopically;  Surgeon: Ivin Poot, MD;  Location: [place];  Service: Open Heart Surgery;  Laterality: [name];   ENDARTERECTOMY Right 07/06/2016   Procedure: ENDARTERECTOMY CAROTID;  Surgeon: Rosetta Posner, MD;  Location: [facility] OR;  Service: Vascular;  Laterality: Right;   ENDARTERECTOMY Right 04/27/2020   Procedure: REDO OF RIGHT ENDARTERECTOMY CAROTID;  Surgeon: Rosetta Posner, MD;  Location[name] OR;  Service: Vascular;  Laterality: Right;   LEFT HEART CATH AND CORS/GRAFTS ANGIOGRAPHY [name] 08/24/2020   Procedure: LEFT HEART CATH AND CORS/GRAFTS ANGIOGRAPHY;  Surgeon: Wellington Hampshire, MD;  Location: [name] [facility];  Service:  Cardiovascular;  Laterality: [name];   PERIPHERAL VASCULAR CATHETERIZATION [name] 04/18/2016   Procedure: Renal Angiography;  Surgeon: Wellington Hampshire, MD;  Location: [place] [facility];  Service: Cardiovascular;  Laterality: [name];   PERIPHERAL VASCULAR INTERVENTION Left 10/15/2018   Procedure: PERIPHERAL VASCULAR INTERVENTION;  Surgeon: Wellington Hampshire, MD;  Location: [place] [facility];  Service: Cardiovascular;  Laterality: Left;  Left superficial femoral   TEE WITHOUT CARDIOVERSION [name] 07/06/2016   Procedure: TRANSESOPHAGEAL ECHOCARDIOGRAM (TEE);  Surgeon: Ivin Poot, MD;  Location: [place];  Service: Open Heart Surgery;  Laterality: [name];   TONSILLECTOMY      Family History  Adopted: Yes  Problem Relation Age of Onset   Diabetes Mother    Diabetes Father    Alcohol abuse Father    Heart disease Father    Drug abuse Father    Stroke Sister    Anxiety disorder Sister     Social History: Married. She reports that she has been smoking cigarettes--1 PPD. Has 48 year old daughter who's high functioning Asperger's with CKD IV. She has a 13.50 pack-year smoking history. She has never used  smokeless tobacco. She reports previous alcohol use. She reports that she does not use drugs.   Allergies  Allergen Reactions   Chantix [Varenicline Tartrate] Other (See Comments)    Feels "crazy" and angry     Medications Prior to Admission  Medication Sig Dispense Refill   albuterol (PROVENTIL HFA;VENTOLIN HFA) 108 (90 Base) MCG/ACT inhaler Inhale 2 puffs into the lungs every 6 (six) hours as needed for wheezing. 1 Inhaler 0   amLODipine (NORVASC) 10 MG tablet Hold until outpatient followup since your blood pressure was normal without this. 90 tablet 1   aspirin 81 MG chewable tablet Chew 1 tablet (81 mg total) by mouth daily.     benztropine (COGENTIN) 0.5 MG tablet Take 1 tablet (0.5 mg total) by mouth at bedtime. 90 tablet 0   carvedilol (COREG) 25 MG tablet Take 1.5 tablets (37.5 mg total) by  mouth 2 (two) times daily with a meal. 90 tablet 5   chlorproMAZINE (THORAZINE) 100 MG tablet TAKE 1 TABLET BY MOUTH EVERYDAY AT BEDTIME 30 tablet 0   clonazePAM (KLONOPIN) 0.5 MG tablet Take 1 tablet (0.5 mg total) by mouth 3 (three) times daily as needed for anxiety. 75 tablet 2   cloNIDine (CATAPRES) 0.1 MG tablet Hold until outpatient followup since your blood pressure was normal without this.  0   clopidogrel (PLAVIX) 75 MG tablet TAKE 1 TABLET BY MOUTH EVERY DAY 30 tablet 3   dapagliflozin propanediol (FARXIGA) 10 MG TABS tablet Take 1 tablet (10 mg total) by mouth daily. Pt needs apt with PCP for further refills 90 tablet 0   Dulaglutide (TRULICITY) 9.60 AV/4.0JW SOPN Inject 0.75 mg into the skin once a week. 6 mL 3   ezetimibe (ZETIA) 10 MG tablet Take 1 tablet (10 mg total) by mouth daily.     FLUoxetine HCl 60 MG TABS Take 60 mg by mouth daily. 90 tablet 0   insulin aspart (NOVOLOG) 100 UNIT/ML injection Inject 0-9 Units into the skin 3 (three) times daily with meals. CBG 70 - 120: 0 units CBG 121 - 150: 1 unit CBG 151 - 200: 2 units CBG 201 - 250: 3 units CBG 251 - 300: 5 units CBG 301 - 350: 7 units CBG 351 - 400: 9 units CBG > 400: call MD 10 mL 11   Insulin Glargine (BASAGLAR KWIKPEN) 100 UNIT/ML Inject 40 Units into the skin 2 (two) times daily. 120 mL 3   Insulin Syringe-Needle U-100 29G X 1/2" 0.3 ML MISC 1 each by Does not apply route 2 (two) times daily. 30 each 0   isosorbide mononitrate (IMDUR) 60 MG 24 hr tablet TAKE ONE TABLET BY MOUTH EVERY MORNING AND ONE-HALF EVERY EVENING 135 tablet 0   lamoTRIgine (LAMICTAL) 200 MG tablet Take 1 tablet (200 mg total) by mouth at bedtime. 90 tablet 0   losartan (COZAAR) 100 MG tablet TAKE ONE TABLET BY MOUTH ONE TIME DAILY 90 tablet 0   naloxone (NARCAN) nasal spray 4 mg/0.1 mL Place 1 spray into the nose as needed for up to 365 doses (for opioid-induced respiratory depresssion). In case of emergency (overdose), spray once into each nostril.  If no response within 3 minutes, repeat application and call 119. 1 each 0   nicotine (NICODERM CQ - DOSED IN MG/24 HOURS) 14 mg/24hr patch Place 14 mg onto the skin daily.      nitroGLYCERIN (NITROSTAT) 0.4 MG SL tablet Place 1 tablet (0.4 mg total) under the tongue every 5 (five) minutes  as needed for chest pain. 25 tablet 1   ondansetron (ZOFRAN) 4 MG tablet Take 1 tablet (4 mg total) by mouth daily as needed for nausea or vomiting. 20 tablet 11   oxyCODONE (OXY IR/ROXICODONE) 5 MG immediate release tablet Take 1 tablet (5 mg total) by mouth 2 (two) times daily as needed for severe pain. Must last 30 days. 60 tablet 0   pregabalin (LYRICA) 225 MG capsule Take 1 capsule (225 mg total) by mouth 2 (two) times daily. 180 capsule 1   REPATHA SURECLICK 673 MG/ML SOAJ INJECT 1 PEN INTO THE SKIN EVERY 14 (FOURTEEN) DAYS. 2 mL 3   rosuvastatin (CRESTOR) 20 MG tablet Take 1 tablet (20 mg total) by mouth daily. 30 tablet 11   spironolactone (ALDACTONE) 25 MG tablet Take 1 tablet (25 mg total) by mouth daily. 90 tablet 0   torsemide (DEMADEX) 20 MG tablet Take 1 tablet (20 mg total) by mouth daily. Decreased from twice a day. 30 tablet 5   vitamin B-12 1000 MCG tablet Take 1 tablet (1,000 mcg total) by mouth daily.      Drug Regimen Review  Drug regimen was reviewed and remains appropriate with no significant issues identified  Home: Home Living Family/patient expects to be discharged to:: Private residence Living Arrangements: Spouse/significant other, Children   Functional History:    Functional Status:  Mobility:          ADL:    Cognition: Cognition Overall Cognitive Status: (P) Within Functional Limits for tasks assessed Arousal/Alertness: (P) Awake/alert Orientation Level: Oriented X4 Cognition Overall Cognitive Status: (P) Within Functional Limits for tasks assessed  Physical Exam: Blood pressure 126/72, pulse 61, temperature 98.3 F (36.8 C), temperature source Oral, resp.  rate 18, height 5\' 6"  (1.676 m), SpO2 94 %. Physical Exam Vitals and nursing note reviewed.  Constitutional:      Appearance: Normal appearance.  HENT: , AT    Mouth/Throat:     Comments: Wears dentures Cardiology: RRR, no MRG Pulmonology: non-labored breathing, equal rise and fall of chest Abdomen: NT, ND Neurological:     Mental Status: She is alert and oriented to person, place, and time.     Comments: Left facial weakness with mild dysarthria. Able to answer orientation questions and follow commands without difficulty. Dense left hemiplegia.    Results for orders placed or performed during the hospital encounter of 06/28/21 (from the past 48 hour(s))  Glucose, capillary     Status: Abnormal   Collection Time: 06/28/21  4:49 PM  Result Value Ref Range   Glucose-Capillary 179 (H) 70 - 99 mg/dL    Comment: Glucose reference range applies only to samples taken after fasting for at least 8 hours.  Glucose, capillary     Status: Abnormal   Collection Time: 06/28/21  8:31 PM  Result Value Ref Range   Glucose-Capillary 216 (H) 70 - 99 mg/dL    Comment: Glucose reference range applies only to samples taken after fasting for at least 8 hours.  Glucose, capillary     Status: Abnormal   Collection Time: 06/29/21 12:06 AM  Result Value Ref Range   Glucose-Capillary 101 (H) 70 - 99 mg/dL    Comment: Glucose reference range applies only to samples taken after fasting for at least 8 hours.  Glucose, capillary     Status: None   Collection Time: 06/29/21  4:07 AM  Result Value Ref Range   Glucose-Capillary 96 70 - 99 mg/dL    Comment: Glucose  reference range applies only to samples taken after fasting for at least 8 hours.  Comprehensive metabolic panel     Status: Abnormal   Collection Time: 06/29/21  5:32 AM  Result Value Ref Range   Sodium 139 135 - 145 mmol/L   Potassium 3.4 (L) 3.5 - 5.1 mmol/L   Chloride 101 98 - 111 mmol/L   CO2 31 22 - 32 mmol/L   Glucose, Bld 79 70 - 99 mg/dL     Comment: Glucose reference range applies only to samples taken after fasting for at least 8 hours.   BUN 11 6 - 20 mg/dL   Creatinine, Ser 0.71 0.44 - 1.00 mg/dL   Calcium 9.4 8.9 - 10.3 mg/dL   Total Protein 5.6 (L) 6.5 - 8.1 g/dL   Albumin 2.9 (L) 3.5 - 5.0 g/dL   AST 24 15 - 41 U/L   ALT 36 0 - 44 U/L   Alkaline Phosphatase 58 38 - 126 U/L   Total Bilirubin 0.6 0.3 - 1.2 mg/dL   GFR, Estimated >60 >60 mL/min    Comment: (NOTE) Calculated using the CKD-EPI Creatinine Equation (2021)    Anion gap 7 5 - 15    Comment: Performed at [facility], [place] [address] [place], [place] [address]  CBC WITH DIFFERENTIAL     Status: Abnormal   Collection Time: 06/29/21  5:32 AM  Result Value Ref Range   WBC 11.5 (H) 4.0 - 10.5 K/uL   RBC 4.11 3.87 - 5.11 MIL/uL   Hemoglobin 12.3 12.0 - 15.0 g/dL   HCT 37.8 36.0 - 46.0 %   MCV 92.0 80.0 - 100.0 fL   MCH 29.9 26.0 - 34.0 pg   MCHC 32.5 30.0 - 36.0 g/dL   RDW 14.3 11.5 - 15.5 %   Platelets 318 150 - 400 K/uL   nRBC 0.0 0.0 - 0.2 %   Neutrophils Relative % 66 %   Neutro Abs 7.5 1.7 - 7.7 K/uL   Lymphocytes Relative 26 %   Lymphs Abs 3.0 0.7 - 4.0 K/uL   Monocytes Relative 6 %   Monocytes Absolute 0.7 0.1 - 1.0 K/uL   Eosinophils Relative 1 %   Eosinophils Absolute 0.2 0.0 - 0.5 K/uL   Basophils Relative 0 %   Basophils Absolute 0.1 0.0 - 0.1 K/uL   Immature Granulocytes 1 %   Abs Immature Granulocytes 0.14 (H) 0.00 - 0.07 K/uL    Comment: Performed at [facility] [address], [name], [place] [address]  Glucose, capillary     Status: None   Collection Time: 06/29/21  6:01 AM  Result Value Ref Range   Glucose-Capillary 83 70 - 99 mg/dL    Comment: Glucose reference range applies only to samples taken after fasting for at least 8 hours.   MR BRAIN WO CONTRAST  Result Date: 06/27/2021 CLINICAL DATA:  Neuro deficit, acute, stroke suspected. EXAM: MRI HEAD WITHOUT CONTRAST TECHNIQUE: Multiplanar, multiecho pulse  sequences of the brain and surrounding structures were obtained without intravenous contrast. COMPARISON:  MRI of the brain June 24, 2021. FINDINGS: Brain: Area of restricted diffusion involving the right side of the pons, consistent with subacute infarct, unchanged from prior MRI. No new focus of restricted diffusion to suggest new acute infarct. No hemorrhage, hydrocephalus, extra-axial collection or mass lesion. Area of encephalomalacia and gliosis in the right parietal and occipital lobes with associated cortical laminar necrosis, unchanged. Scattered foci of T2 hyperintensity are seen within  the white matter of the cerebral hemispheres, nonspecific. Vascular: Normal flow voids. Skull and upper cervical spine: Normal marrow signal. Sinuses/Orbits: Negative. Other: None. IMPRESSION: 1. No new acute infarct since prior MRI. 2. Subacute infarct on the right side of the pons, similar to prior MRI. 3. Area of encephalomalacia and gliosis in the right parietooccipital region with associated cortical laminar necrosis, unchanged. 4. Small amount of nonspecific T2 hyperintense lesions of the white matter. Differential diagnosis include chronic microangiopathy, vasculitis and post inflammatory/infectious processes. Electronically Signed   By: Pedro Earls M.D.   On: 06/27/2021 12:47       Medical Problem List and Plan: 1.  Right pontine stroke  -patient may shower  -ELOS/Goals: 2-3 weeks S  -husband may come in before 8am 2.  Impaired mobility: -DVT/anticoagulation:  Pharmaceutical: Lovenox  -antiplatelet therapy: Continue ASA/Plavix 3. Cluster headaches: abortive oxygen PRN. Oxycodone prn for HA 4. Mood: Team to provide ego support. LCSW to follow for evaluation and support.   -antipsychotic agents: Lamictal daily.  5. Neuropsych: This patient is capable of making decisions on her own behalf. 6. Skin/Wound Care: Routine pressure relief measures.  7. Fluids/Electrolytes/Nutrition: Monitor  I/O. Check lytes in am. 8. HTN: SBP goal 140-180 range to avoid hypoperfusion of brain. Per husband "acts drunk when BP is low" --Continue Imdur, Coreg, aldactone, Demadex, Per reports  9. T2DM: Hgb A1C- 10.7. BS run 200's at home. Dietary education/reinforcement.   --Continue Lantus  45 units BID with SSI for elevated BS  --titrate insulin as indicated. 10. CAD s/p CABG: On ASA, coreg, Cozaar, Demadex, ASA,  11. PAD w/claudication: Has been smoking 1 PPD --quit at admission. Continue Nicotine patch.  --On lyrica BID to manage symptoms.   12. Aspiration PNA: Unasyn D#4/ 5-7. Monitor for symptoms, WBC trend.   --Leucocytosis resolving 18.6-->10.3  13. Chronic HA: Managed with thorazine as well as oxycodone and high flow oxygen prn. 14. H/o depression/anxiety: Continue Lamictal, Prozac with Klonopin prn.  15. Chronic insomnia: Managed with home dose trazodone.  16. Dyslipidemia: Trig- 636, direct LDL-99.6, HDL 29, Chol 202. On Zetia--to start Vespc 17. Vitamin B 12 deficiency: B12- 169 (was WNL a year ago) 18. NASH/Fatty liver: Recheck LFTs in am.   I have personally performed a face to face diagnostic evaluation, including, but not limited to relevant history and physical exam findings, of this patient and developed relevant assessment and plan.  Additionally, I have reviewed and concur with the physician assistant's documentation above.  Izora Ribas, MD 06/29/2021   Bary Leriche, PA-C

## 2021-06-29 NOTE — Progress Notes (Signed)
Inpatient Rehabilitation Care Coordinator Assessment and Plan Patient Details  Name: KAYSE PUCCINI MRN: [id number] Date of Birth: Jan 01, 1971  Today's Date: 06/29/2021  Hospital Problems: Principal Problem:   Right pontine stroke [facility])  Past Medical History:  Past Medical History:  Diagnosis Date   Arthralgia of temporomandibular joint    CAD, multiple vessel    a. 06/2016 Cath: ostLM 40%, ostLAD 40%, pLAD 95%, ost-pLCx 60%, pLCx 95%, mLCx 60%, mRCA 95%, D2 50%, LVSF nl;  b. 07/2016 CABG x 4 (LIMA->LAD, VG->Diag, VG->OM, VG->RCA); c. 08/2016 Cath: 3VD w/ 4/4 patent grafts. LAD distal to LIMA has diff dzs->Med rx; d. 08/2020 Cath: 4/4 patent grafts, native 3VD. EF 55-65%-->Med Rx.   Carotid arterial disease ([place])    a. 07/2016 s/p R CEA; b. 02/2021 U/S: RICA 47-82%, LICA 9-56%.   Clotting disorder ([place])    Depression    Diastolic dysfunction    a. 06/2016 Echo: EF 50-55%, mild inf wall HK, GR1DD, mild MR, RV sys fxn nl, mildly dilated LA, PASP nl   Fatty liver disease, nonalcoholic 2130   History of blood transfusion    with heart surgery   HLD (hyperlipidemia)    Labile hypertension    a. prior renal ngiogram negative for RAS in 03/2016; b. catecholamines and metanephrines normal, mildly elevated renin with normal aldosterone and normal ratio in 02/2016   Myocardial infarction [facility]) 2017   Obesity    PAD (peripheral artery disease) ([place])    a. 09/2018 s/p L SFA stenting; b. 07/2019 Periph Angio: Patent m/d L SFA stent w/ 100% L SFA distal to stent. L AT 100d, L Peroneal diff dzs-->Med Rx; c. 02/2021 ABIs: stable @ 0.61 on R and 0.46 on L.   PTSD (post-traumatic stress disorder)    Tobacco abuse    Type 2 diabetes mellitus ([place]) 12/2015   Past Surgical History:  Past Surgical History:  Procedure Laterality Date   ABDOMINAL AORTOGRAM W/LOWER EXTREMITY [name] 10/15/2018   Procedure: ABDOMINAL AORTOGRAM W/LOWER EXTREMITY;  Surgeon: Wellington Hampshire, MD;  Location: [place] [facility];   Service: Cardiovascular;  Laterality: [name];   ABDOMINAL AORTOGRAM W/LOWER EXTREMITY Bilateral 08/19/2019   Procedure: ABDOMINAL AORTOGRAM W/LOWER EXTREMITY;  Surgeon: Wellington Hampshire, MD;  Location: [place] [facility];  Service: Cardiovascular;  Laterality: Bilateral;   CARDIAC CATHETERIZATION [name] 06/29/2016   Procedure: Left Heart Cath and Coronary Angiography;  Surgeon: Minna Merritts, MD;  Location: [place] [facility];  Service: Cardiovascular;  Laterality: [name];   CARDIAC CATHETERIZATION [name] 08/29/2016   Procedure: Left Heart Cath and Cors/Grafts Angiography;  Surgeon: Wellington Hampshire, MD;  Location: [place] [facility];  Service: Cardiovascular;  Laterality: [name];   CESAREAN SECTION     CHOLECYSTECTOMY     CORONARY ARTERY BYPASS GRAFT [name] 07/06/2016   Procedure: CORONARY ARTERY BYPASS GRAFTING (CABG) x four, using left internal mammary artery and right leg greater saphenous vein harvested endoscopically;  Surgeon: Ivin Poot, MD;  Location: [place];  Service: Open Heart Surgery;  Laterality: [name];   ENDARTERECTOMY Right 07/06/2016   Procedure: ENDARTERECTOMY CAROTID;  Surgeon: Rosetta Posner, MD;  Location: [place];  Service: Vascular;  Laterality: Right;   ENDARTERECTOMY Right 04/27/2020   Procedure: REDO OF RIGHT ENDARTERECTOMY CAROTID;  Surgeon: Rosetta Posner, MD;  Location: [place];  Service: Vascular;  Laterality: Right;   LEFT HEART CATH AND CORS/GRAFTS ANGIOGRAPHY [name] 08/24/2020   Procedure: LEFT HEART CATH AND CORS/GRAFTS ANGIOGRAPHY;  Surgeon: [name]  A, MD;  Location: [place] [facility];  Service: Cardiovascular;  Laterality: [name];   PERIPHERAL VASCULAR CATHETERIZATION [name] 04/18/2016   Procedure: Renal Angiography;  Surgeon: Wellington Hampshire, MD;  Location: [place] [facility];  Service: Cardiovascular;  Laterality: [name];   PERIPHERAL VASCULAR INTERVENTION Left 10/15/2018   Procedure: PERIPHERAL VASCULAR INTERVENTION;  Surgeon: Wellington Hampshire, MD;  Location: [place] [facility];   Service: Cardiovascular;  Laterality: Left;  Left superficial femoral   TEE WITHOUT CARDIOVERSION [name] 07/06/2016   Procedure: TRANSESOPHAGEAL ECHOCARDIOGRAM (TEE);  Surgeon: Ivin Poot, MD;  Location: [place];  Service: Open Heart Surgery;  Laterality: [name];   TONSILLECTOMY     Social History:  reports that she has been smoking cigarettes. She has a 13.50 pack-year smoking history. She has never used smokeless tobacco. She reports previous alcohol use. She reports that she does not use drugs.  Family / Support Systems Marital Status: Married Patient Roles: Spouse, Parent Spouse/Significant Other: [place]-husband [phone]  [phone] Children: Special needs daughter-asperger's diabetic and renal disease Other Supports: Friends Anticipated Caregiver: Husband and daughter is there Ability/Limitations of Caregiver: Husband does work but can work remotely for [employer], but does travel for work Building control surveyor Availability: 24/7 Family Dynamics: Close with small family and they all depend upon one another. Pt has been very independent even with her health issues and is not used to relying upon others  Social History Preferred language: English Religion: Christian Cultural Background: No issues Education: HS Read: Yes Write: Yes Employment Status: Disabled Public relations account executive Issues: No issues Guardian/Conservator: None-according to MD pt is capable of making her own decisions while here   Abuse/Neglect Abuse/Neglect Assessment Can Be Completed: Yes Physical Abuse: Denies Verbal Abuse: Denies Sexual Abuse: Denies Exploitation of patient/patient's resources: Denies Self-Neglect: Denies  Emotional Status Pt's affect, behavior and adjustment status: Pt is motivated to do well and recover from this stroke. She is trying to be positive and optimistic regarding her recovery and rehab. Glad to be here and to get all of the rehab she will be getting Recent Psychosocial Issues: other  health issues Psychiatric History: History of PTSD and anxiety takes medications for this and finds them helpful, she hopes to not digress due to deficits and relying upon others now. Would benefit from seeing neuro-psych while here Substance Abuse History: Tobacco aware should quit smoking for her health and the risks involved. She plans to quit now. No other issues  Patient / Family Perceptions, Expectations & Goals Pt/Family understanding of illness & functional limitations: Pt and husband can explain her stroke and deficits it has caused. She has seen some progress and hopes this will continue while here. Both talk with the MD and feel have a good understanding of her treatment plan moving forward. Premorbid pt/family roles/activities: Wife, mom, friend, caregiver, etc Anticipated changes in roles/activities/participation: resume Pt/family expectations/goals: Pt states: " I hope to be able to take care of myself before I leave here, at least be walking."  Husband states: " We will do whatever is needed for her, she would do the same for [name]."  [employer]: None Premorbid Home Care/DME Agencies: Other (Comment) (cane and built in tub seat) Transportation available at discharge: Pt drove PTA-husband will nnow Resource referrals recommended: Neuropsychology  Discharge Planning Living Arrangements: Spouse/significant other, Children Support Systems: Spouse/significant other, Children, Friends/neighbors Type of Residence: Private residence Insurance Resources: [employer], Multimedia programmer (specify) Psychologist, counselling) Financial Resources: SSD, Family Support Financial Screen Referred: No Living Expenses: Lives  with family Money Management: Patient, Spouse Does the patient have any problems obtaining your medications?: No Home Management: Self now pt and daughter to assist Patient/Family Preliminary Plans: Return home with husband and daughter, husband can work remotely for a  short time and daughter can physically assist as long as pt can direct her care. Will await therapy evaluations and work on plan Care Coordinator Anticipated Follow Up Needs: [place] motivated female who is ready for rehab and hopes her headaches can be managed this tends to limit her participation in therapies. Went to pain management MD prior to admission for this. Lives with husband and special needs daughter who can assist if needed-as long as directed. Has health issues of her own-diabetes, aspergers and renal disease soon will require dialysis. Will await evaluations and work on needs, husband to work remotely for a short time after discharge. Would benefit from seeing neuro-psych while here  Elease Hashimoto 06/29/2021, 12:59 PM

## 2021-06-29 NOTE — Progress Notes (Signed)
Inpatient Rehabilitation Center Individual Statement of Services  Patient Name:  Erika [name]  Date:  06/29/2021  Welcome to the [facility].  Our goal is to provide you with an individualized program based on your diagnosis and situation, designed to meet your specific needs.  With this comprehensive rehabilitation program, you will be expected to participate in at least 3 hours of rehabilitation therapies Monday-Friday, with modified therapy programming on the weekends.  Your rehabilitation program will include the following services:  Physical Therapy (PT), Occupational Therapy (OT), Speech Therapy (ST), 24 hour per day rehabilitation nursing, Therapeutic Recreaction (TR), Neuropsychology, Care Coordinator, Rehabilitation Medicine, Nutrition Services, and Pharmacy Services  Weekly team conferences will be held on Tuesday to discuss your progress.  Your Inpatient Rehabilitation Care Coordinator will talk with you frequently to get your input and to update you on team discussions.  Team conferences with you and your family in attendance may also be held.  Expected length of stay: 3-3.5 weeks  Overall anticipated outcome: CGA level  Depending on your progress and recovery, your program may change. Your Inpatient Rehabilitation Care Coordinator will coordinate services and will keep you informed of any changes. Your Inpatient Rehabilitation Care Coordinator's name and contact numbers are listed  below.  The following services may also be recommended but are not provided by the [facility] will be made to provide these services after discharge if needed.  Arrangements include referral to agencies that provide these services.  Your insurance has been verified to be:  [place] Your primary doctor is:  Waunita Schooner  Pertinent  information will be shared with your doctor and your insurance company.  Inpatient Rehabilitation Care Coordinator:  Ovidio Kin, [place] or Emilia Beck  Information discussed with and copy given to patient by: Elease Hashimoto, 06/29/2021, 1:03 PM

## 2021-06-30 LAB — GLUCOSE, CAPILLARY
Glucose-Capillary: 78 mg/dL (ref 70–99)
Glucose-Capillary: 146 mg/dL — ABNORMAL HIGH (ref 70–99)
Glucose-Capillary: 166 mg/dL — ABNORMAL HIGH (ref 70–99)
Glucose-Capillary: 197 mg/dL — ABNORMAL HIGH (ref 70–99)

## 2021-06-30 LAB — BASIC METABOLIC PANEL
Anion gap: 8 (ref 5–15)
BUN: 9 mg/dL (ref 6–20)
CO2: 30 mmol/L (ref 22–32)
Calcium: 9.5 mg/dL (ref 8.9–10.3)
Chloride: 99 mmol/L (ref 98–111)
Creatinine, Ser: 0.64 mg/dL (ref 0.44–1.00)
GFR, Estimated: >60 mL/min (ref >60)
Glucose, Bld: 78 mg/dL (ref 70–99)
Potassium: 3.8 mmol/L (ref 3.5–5.1)
Sodium: 137 mmol/L (ref 135–145)

## 2021-06-30 LAB — CBC WITH DIFFERENTIAL/PLATELET
Abs Immature Granulocytes: 0.22 10*3/uL — ABNORMAL HIGH (ref 0.00–0.07)
Basophils Absolute: 0.1 10*3/uL (ref 0.0–0.1)
Basophils Relative: 1 %
Eosinophils Absolute: 0.2 10*3/uL (ref 0.0–0.5)
Eosinophils Relative: 1 %
HCT: 37.8 % (ref 36.0–46.0)
Hemoglobin: 12.3 g/dL (ref 12.0–15.0)
Immature Granulocytes: 2 %
Lymphocytes Relative: 20 %
Lymphs Abs: 2.7 10*3/uL (ref 0.7–4.0)
MCH: 30 pg (ref 26.0–34.0)
MCHC: 32.5 g/dL (ref 30.0–36.0)
MCV: 92.2 fL (ref 80.0–100.0)
Monocytes Absolute: 0.7 10*3/uL (ref 0.1–1.0)
Monocytes Relative: 5 %
Neutro Abs: 9.4 10*3/uL — ABNORMAL HIGH (ref 1.7–7.7)
Neutrophils Relative %: 71 %
Platelets: 361 10*3/uL (ref 150–400)
RBC: 4.1 MIL/uL (ref 3.87–5.11)
RDW: 14.4 % (ref 11.5–15.5)
WBC: 13.3 10*3/uL — ABNORMAL HIGH (ref 4.0–10.5)
nRBC: 0 % (ref 0.0–0.2)

## 2021-06-30 LAB — CULTURE, BLOOD (ROUTINE X 2)
Culture: NO GROWTH
Culture: NO GROWTH
Special Requests: ADEQUATE
Special Requests: ADEQUATE

## 2021-06-30 LAB — VITAMIN D 25 HYDROXY (VIT D DEFICIENCY, FRACTURES): Vit D, 25-Hydroxy: 17.66 ng/mL — ABNORMAL LOW (ref 30–100)

## 2021-06-30 MED ORDER — CYANOCOBALAMIN 1000 MCG/ML IJ SOLN
1000.0000 ug | INTRAMUSCULAR | Status: DC
  Administered 2021-06-30 – 2021-07-30 (×2): 1000 ug via SUBCUTANEOUS
  Filled 2021-06-30 (×2): qty 1

## 2021-06-30 MED ORDER — VITAMIN D (ERGOCALCIFEROL) 1.25 MG (50000 UNIT) PO CAPS
50000.0000 [IU] | ORAL_CAPSULE | ORAL | Status: DC
  Administered 2021-06-30 – 2021-07-28 (×5): 50000 [IU] via ORAL
  Filled 2021-06-30 (×5): qty 1

## 2021-06-30 NOTE — Progress Notes (Signed)
Occupational Therapy Session Note  Patient Details  Name: Erika [name] MRN: [id number] Date of Birth: [birth year]  Today's Date: 06/30/2021 OT Individual Time: 6283-6629 OT Individual Time Calculation (min): 61 min    Short Term Goals: Week 1:  OT Short Term Goal 1 (Week 1): Pt will be able to complete toilet transfer squat pivot with min A. OT Short Term Goal 2 (Week 1): Pt will be able to stand with min A and pull pants over hips with min A. OT Short Term Goal 3 (Week 1): Pt will be able to don shirt with min A. OT Short Term Goal 4 (Week 1): Pt will be able to self mobilize LUE.  Skilled Therapeutic Interventions/Progress Updates:    Pt received seated in recliner with environmental services present, denies pain, agreeable to therapy. Session focus on self-care retraining and LUE NMR/WB in prep for improved ADL performance. Partial stand-pivot to w/c placed on pt's R side with mod A to power up and min Vcs for safety. Total A to don/doff B shoes. Completed 1x15 of the following: forward towel slides, B forearm WB pulses, scapular elevation, and retraction. Reviewed importance of mental imagery for motor recovery, will benefit from further education.   1:1 NMES applied to L wrist/digit extensors + L supraspinatus and middle deltoid to help approximate shoulder joint to reduce sublux and reduce pain at the below settings:   Ratio 1:3 Rate 35 pps Waveform- Asymmetric Ramp 1.0 Pulse 476 Intensity- 10 clicks Duration -   5 min, 8 min  Facilitated functional grasp/release on cup throughout.  No adverse reactions after treatment and is skin intact. Denies pain post application.  Stedy transfer to toilet 2/2 time management/urgency. Min A to power up, total A for pericare and LB clothing mangaement in standing post continent void of bladder. Stedy transfer back to bed. Doffed shirt with light min A to pull over head, donned new shirt with min A to thread LUE.  Returned to supine with  min A to progress LLE onto bed.  Pt left semi-reclined in bed with husband present with bed alarm engaged, call bell in reach, and all immediate needs met.    Therapy Documentation Precautions:  Precautions Precautions: Fall Precaution Comments: L hemiplegia Restrictions Weight Bearing Restrictions: No LUE Weight Bearing: Weight bearing as tolerated LLE Weight Bearing: Weight bearing as tolerated  Pain: denies ADL: See Care Tool for more details.   Therapy/Group: Individual Therapy  Volanda Napoleon MS, OTR/L  06/30/2021, 6:59 AM

## 2021-06-30 NOTE — Progress Notes (Signed)
Physical Therapy Session Note  Patient Details  Name: Erika [name] MRN: [id number] Date of Birth: [birth year]  Today's Date: 06/30/2021 PT Individual Time: 0800-0900 PT Individual Time Calculation (min): 60 min   Short Term Goals: Week 1:  PT Short Term Goal 1 (Week 1): Pt will perform bed mobility with minA. PT Short Term Goal 2 (Week 1): Pt will perform sit to stand transfer with minA. PT Short Term Goal 3 (Week 1): Pt perform bed to chair transfer with minA. PT Short Term Goal 4 (Week 1): Pt will ambulate x25' with modA.  Skilled Therapeutic Interventions/Progress Updates:   Pain:  Pt reports L shoulder pain.  Treatment to tolerance.  Rest breaks and repositioning as needed.  Discussed w/husband to refrain from "stretching" shoulder until trained by therapist w/appropriate exercises.  Pt initially supine and agreeable to treatment session.  Husband at bedside and helpful/engaged in care. Pt supine to sit w/cues for sequencing, cga from L side, use of bedrail. Sit to stand w/min assist.  Worked on static standing w/therapist guarding L knee.  Worked on wt shifting to L as pt stands w/majority of wbing thru R limb. stand pivot transfer to wc w/cues for sequencing, mod assist   Once in wc, pt transported to gym for continued session. Bilat Legrests adjusted for improved sitting posture. Sit to stand in parallel bars w/min assist but pt relies on R hemibody w/significant wtshifting to R w/transition and remains shifted to R in standing.   Worked on reaching, turning head and shoulders to L to promote wbing thru LLE. Gait in parallel bars: First worked on progressing L hip over L foot as in initial contact thru midstance to prevent retraction at midstance. Gait 16ft in parallel bars w/L DF acewrap assist and max assist to advance limb although pt does initiate at hip, overall mod assist. Gait 39ft x 2 in hallway w/rail on R, pt able to complete approx 70% swing phase L w/no clearance  due to decreased knee and ankle flexion.  Therapist stabilized L knee which tends to either buckle or hyperextend w/loading-midstance, cues to extend at hip w/loading, able to step thru on R to achieve trailing limb on L, overall mod assist.   In sitting:  worked on controlling eccetric desecent from extension to flexion at knee which did result in quad activation.  Repeated efforts resulted in pt able to concentrically contract L quads thru approx 10% of range repeatedly but fatigues quickly.  At end of session, pt transported to room.  Pt left oob in wc w/alarm belt set and needs in reach   Therapy Documentation Precautions:  Precautions Precautions: Fall Precaution Comments: L hemiplegia Restrictions Weight Bearing Restrictions: No LUE Weight Bearing: Weight bearing as tolerated LLE Weight Bearing: Weight bearing as tolerated    Therapy/Group: Individual Therapy Callie Fielding, [place] 06/30/2021, 12:41 PM

## 2021-06-30 NOTE — Progress Notes (Signed)
PROGRESS NOTE   Subjective/Complaints: Working in the parallel bars Husband noted that breakfast was given at 7am and patient called him in tears.  WBC increasing  ROS: +left sided weakness, +depression   Objective:   No results found. Recent Labs    06/29/21 0532 06/30/21 0528  WBC 11.5* 13.3*  HGB 12.3 12.3  HCT 37.8 37.8  PLT 318 361   Recent Labs    06/29/21 0532 06/30/21 0528  NA 139 137  K 3.4* 3.8  CL 101 99  CO2 31 30  GLUCOSE 79 78  BUN 11 9  CREATININE 0.71 0.64  CALCIUM 9.4 9.5    Intake/Output Summary (Last 24 hours) at 06/30/2021 1614 Last data filed at 06/30/2021 1300 Gross per 24 hour  Intake 600 ml  Output --  Net 600 ml        Physical Exam: Vital Signs Blood pressure 118/70, pulse (!) 58, temperature 97.8 F (36.6 C), resp. rate 16, height 5\' 6"  (1.676 m), SpO2 96 %. Gen: no distress, normal appearing HEENT: oral mucosa pink and moist, NCAT Cardio: Bradycardia Chest: normal effort, normal rate of breathing Abd: soft, non-distended Ext: no edema Psych: pleasant, flat affect Skin: intact Neurological:    Mental Status: She is alert and oriented to person, place, and time.    Comments: Left facial weakness with mild dysarthria. Able to answer orientation questions and follow commands without difficulty. Dense left hemiplegia.     Assessment/Plan: 1. Functional deficits which require 3+ hours per day of interdisciplinary therapy in a comprehensive inpatient rehab setting. Physiatrist is providing close team supervision and 24 hour management of active medical problems listed below. Physiatrist and rehab team continue to assess barriers to discharge/monitor patient progress toward functional and medical goals  Care Tool:  Bathing    Body parts bathed by patient: Chest, Abdomen   Body parts bathed by helper: Left upper leg, Right lower leg     Bathing assist Assist Level:  Moderate Assistance - Patient 50 - 74%     Upper Body Dressing/Undressing Upper body dressing   What is the patient wearing?: Pull over shirt    Upper body assist Assist Level: Minimal Assistance - Patient > 75%    Lower Body Dressing/Undressing Lower body dressing      What is the patient wearing?: Pants     Lower body assist Assist for lower body dressing: Maximal Assistance - Patient 25 - 49%     Toileting Toileting    Toileting assist Assist for toileting: Total Assistance - Patient < 25%     Transfers Chair/bed transfer  Transfers assist     Chair/bed transfer assist level: Moderate Assistance - Patient 50 - 74%     Locomotion Ambulation   Ambulation assist      Assist level: Moderate Assistance - Patient 50 - 74% Assistive device: Hand held assist Max distance: 30   Walk 10 feet activity   Assist     Assist level: Moderate Assistance - Patient - 50 - 74% Assistive device: Hand held assist   Walk 50 feet activity   Assist Walk 50 feet with 2 turns activity did not occur: Safety/medical concerns  Walk 150 feet activity   Assist Walk 150 feet activity did not occur: Safety/medical concerns         Walk 10 feet on uneven surface  activity   Assist Walk 10 feet on uneven surfaces activity did not occur: Safety/medical concerns         Wheelchair     Assist Will patient use wheelchair at discharge?: No             Wheelchair 50 feet with 2 turns activity    Assist            Wheelchair 150 feet activity     Assist          Blood pressure 118/70, pulse (!) 58, temperature 97.8 F (36.6 C), resp. rate 16, height 5\' 6"  (1.676 m), SpO2 96 %.    Medical Problem List and Plan: 1.  Right pontine stroke             -patient may shower             -ELOS/Goals: 2-3 weeks S             -husband may come in before 8am  Initial CIR evals today 2.  Impaired mobility: -DVT/anticoagulation:   Pharmaceutical: Lovenox             -antiplatelet therapy: Continue ASA/Plavix 3. Cluster headaches: abortive oxygen PRN. Oxycodone prn for HA 4. Mood: Team to provide ego support. LCSW to follow for evaluation and support.             -antipsychotic agents: Lamictal daily. 5. Neuropsych: This patient is capable of making decisions on her own behalf. 6. Skin/Wound Care: Routine pressure relief measures. 7. Fluids/Electrolytes/Nutrition: Monitor I/O. Check lytes in am. 8. HTN: SBP goal 140-180 range to avoid hypoperfusion of brain. Per husband "acts drunk when BP is low" --Continue Imdur, Coreg, aldactone, Demadex, Per reports 9. T2DM: Hgb A1C- 10.7. BS run 200's at home. Dietary education/reinforcement.             --Continue Lantus  45 units BID with SSI for elevated BS             --titrate insulin as indicated. 10. CAD s/p CABG: On ASA, coreg, Cozaar, Demadex, ASA, 11. PAD w/claudication: Has been smoking 1 PPD --quit at admission. Continue Nicotine patch. --On lyrica BID to manage symptoms.   12. Aspiration PNA: Unasyn D#4/ 5-7. Monitor for symptoms, WBC trend.             --Leucocytosis resolving 18.6-->10.3 13. Chronic HA: Managed with thorazine as well as oxycodone and high flow oxygen prn. 14. H/o depression/anxiety: Continue Lamictal, Prozac with Klonopin prn. 15. Chronic insomnia: Managed with home dose trazodone. 16. Dyslipidemia: Trig- 636, direct LDL-99.6, HDL 29, Chol 202. On Zetia--to start Vespc 17. Vitamin B 12 deficiency: B12- 169 (was WNL a year ago) 18. NASH/Fatty liver: LFTs reviewed and normalized 19. Dense left sided hemiplegia: [place] regarding NMES.  20. Hypokalemia: supplement 26meq today and repeat BMP tomorrow.  21. Leukocytosis: trending upward. UA obtained and negative. F/u UC 22. Vitamin D deficiency: start ergocalciferol 50,000U weekly for 7 weeks.   LOS: 2 days A FACE TO FACE EVALUATION WAS PERFORMED  Clide Deutscher Deklyn Gibbon 06/30/2021, 4:14 PM

## 2021-07-01 ENCOUNTER — Inpatient Hospital Stay (HOSPITAL_COMMUNITY): Payer: Managed Care, Other (non HMO)

## 2021-07-01 LAB — MAGNESIUM: Magnesium: 2.2 mg/dL (ref 1.7–2.4)

## 2021-07-01 LAB — GLUCOSE, CAPILLARY
Glucose-Capillary: 68 mg/dL — ABNORMAL LOW (ref 70–99)
Glucose-Capillary: 72 mg/dL (ref 70–99)
Glucose-Capillary: 95 mg/dL (ref 70–99)
Glucose-Capillary: 211 mg/dL — ABNORMAL HIGH (ref 70–99)
Glucose-Capillary: 203 mg/dL — ABNORMAL HIGH (ref 70–99)

## 2021-07-01 LAB — CBC WITH DIFFERENTIAL/PLATELET
Abs Immature Granulocytes: 0.22 10*3/uL — ABNORMAL HIGH (ref 0.00–0.07)
Basophils Absolute: 0.1 10*3/uL (ref 0.0–0.1)
Basophils Relative: 1 %
Eosinophils Absolute: 0.2 10*3/uL (ref 0.0–0.5)
Eosinophils Relative: 1 %
HCT: 39.4 % (ref 36.0–46.0)
Hemoglobin: 12.7 g/dL (ref 12.0–15.0)
Immature Granulocytes: 2 %
Lymphocytes Relative: 22 %
Lymphs Abs: 2.9 10*3/uL (ref 0.7–4.0)
MCH: 29.9 pg (ref 26.0–34.0)
MCHC: 32.2 g/dL (ref 30.0–36.0)
MCV: 92.7 fL (ref 80.0–100.0)
Monocytes Absolute: 1 10*3/uL (ref 0.1–1.0)
Monocytes Relative: 7 %
Neutro Abs: 9.1 10*3/uL — ABNORMAL HIGH (ref 1.7–7.7)
Neutrophils Relative %: 67 %
Platelets: 373 10*3/uL (ref 150–400)
RBC: 4.25 MIL/uL (ref 3.87–5.11)
RDW: 14.3 % (ref 11.5–15.5)
WBC: 13.4 10*3/uL — ABNORMAL HIGH (ref 4.0–10.5)
nRBC: 0 % (ref 0.0–0.2)

## 2021-07-01 LAB — URINE CULTURE: Culture: NO GROWTH

## 2021-07-01 MED ORDER — OXYCODONE HCL 5 MG PO TABS
5.0000 mg | ORAL_TABLET | Freq: Three times a day (TID) | ORAL | Status: DC | PRN
  Administered 2021-07-01 – 2021-07-27 (×48): 5 mg via ORAL
  Filled 2021-07-01 (×52): qty 1

## 2021-07-01 MED ORDER — EXERCISE FOR HEART AND HEALTH BOOK
Freq: Once | Status: AC
  Filled 2021-07-01 (×2): qty 1

## 2021-07-01 MED ORDER — INSULIN GLARGINE 100 UNIT/ML ~~LOC~~ SOLN
44.0000 [IU] | Freq: Two times a day (BID) | SUBCUTANEOUS | Status: DC
  Administered 2021-07-01: 44 [IU] via SUBCUTANEOUS
  Filled 2021-07-01 (×3): qty 0.44

## 2021-07-01 MED ORDER — BLOOD PRESSURE CONTROL BOOK
Freq: Once | Status: AC
  Filled 2021-07-01 (×3): qty 1

## 2021-07-01 MED ORDER — LIVING WELL WITH DIABETES BOOK
Freq: Once | Status: AC
  Filled 2021-07-01 (×2): qty 1

## 2021-07-01 NOTE — IPOC Note (Signed)
Overall Plan of Care [facility]) Patient Details Name: Erika [name] MRN: [id number] DOB: 16-Dec-1971  Admitting Diagnosis: Right pontine stroke [facility])  Hospital Problems: Principal Problem:   Right pontine stroke [facility])     Functional Problem List: Nursing Medication Management, Pain, Safety, Endurance, Bowel  PT Balance, Endurance, Motor, Pain, Safety  OT Balance, Cognition, Endurance, Motor, Vision  SLP Cognition, Safety, Motor  TR         Basic ADL's: OT Bathing, Dressing, Toileting     Advanced  ADL's: OT       Transfers: PT Bed Mobility, Bed to Chair, Car, Manufacturing systems engineer, Metallurgist: PT Ambulation, Stairs     Additional Impairments: OT Fuctional Use of Upper Extremity  SLP Swallowing, Social Cognition   Problem Solving, Memory, Attention  TR      Anticipated Outcomes Item Anticipated Outcome  Self Feeding independent  Swallowing  mod I   Basic self-care  CGA  Toileting  CGA   Bathroom Transfers CGA  Bowel/Bladder  manage bowel with mod I  Transfers  Supervision  Locomotion  Supervision  Communication  mod I  Cognition  supervision complex, mod I basic cognitive  Pain  at or below level 4  Safety/Judgment  maintain safety with cues/reminders   Therapy Plan: PT Intensity: Minimum of 1-2 x/day ,45 to 90 minutes PT Frequency: 5 out of 7 days PT Duration Estimated Length of Stay: 3-3.5 weeks OT Intensity: Minimum of 1-2 x/day, 45 to 90 minutes OT Frequency: 5 out of 7 days OT Duration/Estimated Length of Stay: 21-23 days SLP Intensity: Minumum of 1-2 x/day, 30 to 90 minutes SLP Frequency: 3 to 5 out of 7 days SLP Duration/Estimated Length of Stay: 3-3.5 weeks   Due to the current state of emergency, patients may not be receiving their 3-hours of Medicare-mandated therapy.   Team Interventions: Nursing Interventions Disease Management/Prevention, Medication Management, Discharge Planning, Pain Management, Bowel  Management, Patient/Family Education  PT interventions Ambulation/gait training, Community reintegration, DME/adaptive equipment instruction, Neuromuscular re-education, Psychosocial support, Stair training, UE/LE Strength taining/ROM, Wheelchair propulsion/positioning, Discharge planning, Training and development officer, Functional electrical stimulation, Pain management, Skin care/wound management, Therapeutic Activities, UE/LE Coordination activities, Cognitive remediation/compensation, Disease management/prevention, Functional mobility training, Patient/family education, Splinting/orthotics, Therapeutic Exercise, Visual/perceptual remediation/compensation  OT Interventions Balance/vestibular training, Cognitive remediation/compensation, Discharge planning, DME/adaptive equipment instruction, Functional mobility training, Neuromuscular re-education, Psychosocial support, Patient/family education, Self Care/advanced ADL retraining, Therapeutic Activities, Therapeutic Exercise, UE/LE Strength taining/ROM, UE/LE Coordination activities, Visual/perceptual remediation/compensation  SLP Interventions Cognitive remediation/compensation, Dysphagia/aspiration precaution training, Medication managment, Patient/family education, Functional tasks, Speech/Language facilitation  TR Interventions    SW/CM Interventions Discharge Planning, Psychosocial Support, Patient/Family Education   Barriers to Discharge MD  Medical stability  Nursing Decreased caregiver support, Home environment access/layout 1 level 4ste w spouse; caregiver for special needs child  PT Home environment access/layout    OT      SLP      SW       Team Discharge Planning: Destination: PT-Home ,OT- Home , SLP-Home Projected Follow-up: PT-Home health PT, 24 hour supervision/assistance, OT-  Outpatient OT, SLP-Other (comment) (TBD pending progress) Projected Equipment Needs: PT-To be determined, OT- 3 in 1 bedside comode, To be determined,  SLP-  Equipment Details: PT- , OT-  Patient/family involved in discharge planning: PT- Patient, Family member/caregiver,  OT-Patient, Family member/caregiver, SLP-Patient, Family member/caregiver  MD ELOS: 2-3 weeks Medical Rehab Prognosis:  Excellent Assessment: Mrs. Erika [name] is a 50 year old woman who is admitted to  CIR with right pontine stroke. Active medical issues include vitamin D deficiency, leukocytosis, hypokalemia, dense-left sided hemiplegia, NASH/fatty liver, Vitamin B 12 deficiency, dyslipidemia, chronic insomnia, h/o depression and anxiety, chronic headache, aspiration pneumonia, PAD with claudication, CAD/s CABG, T2 DM, and hypertension. She has been started on high dose ergocalciferol weekly for 7 weeks, [name] and UC were obtained and are negative/UC will be followed today and CBC repeated/CXR ordered given rising WBC, oxygen is being continued for cluster headaches, klonopin and oxycodone have been adjusted and oxycodone frequency has been decreased, Lantus has been decreased given hypoglycemia.     See Team Conference Notes for weekly updates to the plan of care

## 2021-07-01 NOTE — Progress Notes (Signed)
Physical Therapy Session Note  Patient Details  Name: Erika [name] MRN: [id number] Date of Birth: Dec 27, 1970  Today's Date: 07/01/2021 PT Individual Time: 1101-1200 and 1400-1435 PT Individual Time Calculation (min): 59 min and 35 min  Short Term Goals: Week 1:  PT Short Term Goal 1 (Week 1): Pt will perform bed mobility with minA. PT Short Term Goal 2 (Week 1): Pt will perform sit to stand transfer with minA. PT Short Term Goal 3 (Week 1): Pt perform bed to chair transfer with minA. PT Short Term Goal 4 (Week 1): Pt will ambulate x25' with modA.  Skilled Therapeutic Interventions/Progress Updates:  Patient seated EOB on entrance to room with mother-in-law present. Patient alert and agreeable to PT session. Patient denied pain during session.  Therapeutic Activity: Session 1: Bed Mobility: Patient performed lateral scoot along EOB to L with and R with mod A. VC/ tc required for forward lean for adequate unweighting, R hand/ foot placement and progression, and effort. Transfers: Patient performed squat pivot transfer to L bed--> w/c with Min/ Mod A following instructions and with LLE placement and vc for technique. At end of session, biomechanics of unweighting from seated position using forward lean in order to initiate scooting and squat pivot transfers. Squat pivot to R from w/c--> EOB with Min A following instructions, education and vc/ tc for technique and BLE positioning.  STS transfers performed at parallel bars with RUE pull and Mod/ max A improving to Mod A. Required Max A initially for block to L knee to prevent buckling and improving to Min/ Mod A for knee block following NMR. Provided verbal cues for technique.  Gait Training:  Patient ambulated 8 feet using parallel bars with Max A for LLE advancement. Demonstrated lean to R requiring vc/ tc for appropriate weight shift and Max A for block to L knee as well as for advancement and placement for stepping of LLE.    Neuromuscular Re-ed: NMR facilitated during session with focus on pre-gait training, joint approximation, muscle activation/ facilitation. Pt guided in weight shifting, then progressed to fwd/ bkwd stepping with no to minimal glute and quad activation noted. On final standing bout, pt guided in minisquats where LLE glute and quad activation increased to point of pt demonstrating ability to control concentric and eccentric contraction of quad anf glutes requiring up to Min A for guard of knee buckle. NMR performed for improvements in motor control and coordination, balance, sequencing, judgement, and self confidence/ efficacy in performing all aspects of mobility at highest level of independence.   Patient sitting on EOB at end of session with brakes locked, bed alarm set, husband in room, and all needs within reach. Tray table set up for lunch arrival.   Session 2: Patient seated on EOB on entrance to room. Husband present. Patient alert and agreeable to PT session. Patient denied pain during session.  Therapeutic Activity: Transfers: Patient performed squat pivot transfer to L and R during session with  Min/ Mod A and continued vc/tc required for forward lean to unweight, along with hand and foot placement for good technique. STS transfers performed at [facility] in hallway with Min/ Mod A. Provided verbal cues for forward lean and technique.  Gait Training:  Patient ambulated 32 feet using RHR in hallway improving from Max A for weight shifting, LLE advancement/ placement and guard to L knee to Min/ Mod A with pt able to initiate advancement and Min guard to [employer]. Provided vc/ tc for sequencing of weight shift,  LE advancement, knee extension during stance phase.  Once back in room and seated EOB, pt requests to lie down d/t fatigue and requires cueing for directionality and technique. Mod A for LLE to bed. Educated pt and husband on positioning of LUE on pillow for edema mgmt. Patient supine in bed at  end of session with brakes locked, bed alarm set, and all needs within reach.   Therapy Documentation Precautions:  Precautions Precautions: Fall Precaution Comments: L hemiplegia Restrictions Weight Bearing Restrictions: No LUE Weight Bearing: Weight bearing as tolerated LLE Weight Bearing: Weight bearing as tolerated  Therapy/Group: Individual Therapy  [name] PT, DPT 07/01/2021, 12:49 PM

## 2021-07-01 NOTE — Progress Notes (Signed)
Slept good. No PRN meds needed this shift. Patient reports having cluster HA's, without complaint of. Discouraged with deficits from current stroke. Coughing when taking meds whole with water. Left PRAFO and Left WHO- wore most of night. Erika [name] A

## 2021-07-01 NOTE — Significant Event (Signed)
Hypoglycemic Event  CBG: 68  Treatment: 4 oz juice/soda  Symptoms: None  Follow-up CBG: Time:0626 CBG Result:72  Possible Reasons for Event: Unknown  Comments/MD notified:treated per protocol.    Erika [name] A

## 2021-07-01 NOTE — Progress Notes (Signed)
Occupational Therapy Session Note  Patient Details  Name: Erika [name] MRN: [id number] Date of Birth: [birth year]  Today's Date: 07/01/2021 OT Individual Time: 3734-2876 OT Individual Time Calculation (min): 53 min    Short Term Goals: Week 1:  OT Short Term Goal 1 (Week 1): Pt will be able to complete toilet transfer squat pivot with min A. OT Short Term Goal 2 (Week 1): Pt will be able to stand with min A and pull pants over hips with min A. OT Short Term Goal 3 (Week 1): Pt will be able to don shirt with min A. OT Short Term Goal 4 (Week 1): Pt will be able to self mobilize LUE.  Skilled Therapeutic Interventions/Progress Updates:    Pt received seated EOB finishing breakfast, denies pain, agreeable to therapy. Session focus on functional transfers, LUE/LLE NMR/WB, dynamic sitting balance in prep for improved ADL performance. Squat-pivot with max Vcs for technique and mod A to lift and pivot hips to w/c. Total A to don B shoes. Declines ADL this morning, stating husband will help her bathe. Total A w/c transport to and from gym 2/2 time management + energy conservation. Focus on improving squat-pivot technique, pt completed 2 squat-pivots to the L/R , progressing from mod to min A. Will cont to benefit from additional practice as pt is slightly impulsive and needs consistent cues for improving technique. Attempted STS with RW and max A to block L knee, pt attempting table top task, but with large anterior LOB req max A to return safely to mat. Continued STS practice with therapist in front facilitating WS to LLE. Finally, practiced reaching outside BOS while seated and therapist facilitate LUE WB. No LOB and pt easily able to return to midline. Squat-pivot > w/c > bed same manner as before.    Pt left seated EOB with MIL present with bed alarm engaged, call bell in reach, and all immediate needs met.    Therapy Documentation Precautions:  Precautions Precautions: Fall Precaution  Comments: L hemiplegia Restrictions Weight Bearing Restrictions: No LUE Weight Bearing: Weight bearing as tolerated LLE Weight Bearing: Weight bearing as tolerated  Pain: denies   ADL: See Care Tool for more details. Therapy/Group: Individual Therapy  Volanda Napoleon MS, OTR/L  07/01/2021, 6:41 AM

## 2021-07-01 NOTE — Progress Notes (Signed)
Speech Language Pathology Daily Session Note  Patient Details  Name: Erika [name] MRN: [id number] Date of Birth: [birth year]  Today's Date: 07/01/2021 SLP Individual Time: 3888-7579 SLP Individual Time Calculation (min): 44 min  Short Term Goals: Week 1: SLP Short Term Goal 1 (Week 1): Patient will tolerate Dys 3 solids, thin liquids without overt s/s aspiration or penetration and with consistent following of swallow precautions (small sips via straw or cup). SLP Short Term Goal 2 (Week 1): Patient will tolerate trials of regular solids textures with minimal cues for clearing oral cavity and checking for left sided oral pocketing. SLP Short Term Goal 3 (Week 1): Patient will utilize speech strategies (slow down rate, overarticulate) in structured conversation with min-modA cues. SLP Short Term Goal 4 (Week 1): Patient will perfrom mildly complex problem solving tasks in mildly distracting environments with modA cues to redirect. SLP Short Term Goal 5 (Week 1): Patient will demonstrate daily recall of specific medical and therapeutic interventions (exercises, strategies, etc.) with minA cues.  Skilled Therapeutic Interventions: Pt seen for skilled ST with focus on swallowing and cognition goals, pt mother in law present throughout. Pt repositioned in bed for PO trials, endorses ongoing pocketing of food in R cheek that she cleared lingually or with finger. Pt independent for recall and use of swallow strategies with regular texture trials and thin via straw. Regular trials with extended mastication and significant diffuse oral stasis despite very small bite size. Liquid wash mildly effective in clearing. No overt s/s aspiration across trials, pt educated on continuing D3 diet due to oral phase impairments. SLP facilitating mildly complex problem solving tasks by providing min A cues for sustained attention and redirection. Pt left in bed with family present and all needs met. Cont ST POC.    Pain Pain Assessment Pain Scale: 0-10 Pain Score: 0-No pain Faces Pain Scale: No hurt  Therapy/Group: Individual Therapy  Dewaine Conger 07/01/2021, 10:18 AM

## 2021-07-01 NOTE — Progress Notes (Signed)
PROGRESS NOTE   Subjective/Complaints: Slept better last night, appreciate nursing note CBG 68 this morning- decreased Lantus to 44U BID Leukocytosis continued to trend upward slightly. UA negative- will obtain CXR  ROS: +left sided weakness, +depression, +anxiety   Objective:   No results found. Recent Labs    06/30/21 0528 07/01/21 0515  WBC 13.3* 13.4*  HGB 12.3 12.7  HCT 37.8 39.4  PLT 361 373   Recent Labs    06/29/21 0532 06/30/21 0528  NA 139 137  K 3.4* 3.8  CL 101 99  CO2 31 30  GLUCOSE 79 78  BUN 11 9  CREATININE 0.71 0.64  CALCIUM 9.4 9.5    Intake/Output Summary (Last 24 hours) at 07/01/2021 0904 Last data filed at 07/01/2021 0056 Gross per 24 hour  Intake 1197.13 ml  Output --  Net 1197.13 ml        Physical Exam: Vital Signs Blood pressure 133/60, pulse (!) 52, temperature 98.4 F (36.9 C), resp. rate 17, height 5\' 6"  (1.676 m), SpO2 90 %. Gen: no distress, normal appearing, husband at bedside HEENT: oral mucosa pink and moist, NCAT Cardio: Bradycardia Chest: normal effort, normal rate of breathing Abd: soft, non-distended Ext: no edema Psych: pleasant, flat affect Skin: intact Neurological:    Mental Status: She is alert and oriented to person, place, and time.    Comments: Left facial weakness with mild dysarthria. Able to answer orientation questions and follow commands without difficulty. Dense left hemiplegia.     Assessment/Plan: 1. Functional deficits which require 3+ hours per day of interdisciplinary therapy in a comprehensive inpatient rehab setting. Physiatrist is providing close team supervision and 24 hour management of active medical problems listed below. Physiatrist and rehab team continue to assess barriers to discharge/monitor patient progress toward functional and medical goals  Care Tool:  Bathing    Body parts bathed by patient: Chest, Abdomen   Body parts  bathed by helper: Left upper leg, Right lower leg     Bathing assist Assist Level: Moderate Assistance - Patient 50 - 74%     Upper Body Dressing/Undressing Upper body dressing   What is the patient wearing?: Pull over shirt    Upper body assist Assist Level: Minimal Assistance - Patient > 75%    Lower Body Dressing/Undressing Lower body dressing      What is the patient wearing?: Pants     Lower body assist Assist for lower body dressing: Maximal Assistance - Patient 25 - 49%     Toileting Toileting    Toileting assist Assist for toileting: Moderate Assistance - Patient 50 - 74% (stedy)     Transfers Chair/bed transfer  Transfers assist     Chair/bed transfer assist level: Moderate Assistance - Patient 50 - 74%     Locomotion Ambulation   Ambulation assist      Assist level: Moderate Assistance - Patient 50 - 74% Assistive device: Hand held assist Max distance: 30   Walk 10 feet activity   Assist     Assist level: Moderate Assistance - Patient - 50 - 74% Assistive device: Hand held assist   Walk 50 feet activity   Assist Walk  50 feet with 2 turns activity did not occur: Safety/medical concerns         Walk 150 feet activity   Assist Walk 150 feet activity did not occur: Safety/medical concerns         Walk 10 feet on uneven surface  activity   Assist Walk 10 feet on uneven surfaces activity did not occur: Safety/medical concerns         Wheelchair     Assist Will patient use wheelchair at discharge?: No             Wheelchair 50 feet with 2 turns activity    Assist            Wheelchair 150 feet activity     Assist          Blood pressure 133/60, pulse (!) 52, temperature 98.4 F (36.9 C), resp. rate 17, height 5\' 6"  (1.676 m), SpO2 90 %.    Medical Problem List and Plan: 1.  Right pontine stroke             -patient may shower             -ELOS/Goals: 2-3 weeks S             -husband  may come in before 8am  Continue CIR 2.  Impaired mobility: Continue Lovenox             -antiplatelet therapy: Continue ASA/Plavix 3. Cluster headaches: abortive oxygen PRN. Oxycodone prn for HA 4. Mood: Team to provide ego support. LCSW to follow for evaluation and support.             -antipsychotic agents: Lamictal daily. 5. Neuropsych: This patient is capable of making decisions on her own behalf. 6. Skin/Wound Care: Routine pressure relief measures. 7. Fluids/Electrolytes/Nutrition: Monitor I/O. Check lytes in am. 8. HTN: SBP goal 140-180 range to avoid hypoperfusion of brain. Per husband "acts drunk when BP is low" --Continue Imdur, Coreg, aldactone, Demadex, Per reports 9. T2DM: Hgb A1C- 10.7. BS run 200's at home. Dietary education/reinforcement.             --Continue Lantus  45 units BID with SSI for elevated BS             --titrate insulin as indicated. 10. CAD s/p CABG: On ASA, coreg, Cozaar, Demadex, ASA, 11. PAD w/claudication: Has been smoking 1 PPD --quit at admission. Continue Nicotine patch. --On lyrica BID to manage symptoms.   12. Aspiration PNA: Unasyn D#4/ 5-7. Monitor for symptoms, WBC trend.             --Leucocytosis resolving 18.6-->10.3 13. Chronic cluster headaches, currently well controlled: Managed with thorazine as well as oxycodone and high flow oxygen prn. Decreased oxycodone to TID PRN and edited order to indicate it should be given after therapy as per husband's request- makes her too sedated to tolerate therapy.  14. H/o depression/anxiety: Continue Lamictal, Prozac with Klonopin prn. 15. Chronic insomnia: Managed with home dose trazodone. 16. Dyslipidemia: Trig- 636, direct LDL-99.6, HDL 29, Chol 202. On Zetia--to start Vespc 17. Vitamin B 12 deficiency: B12- 169 (was WNL a year ago) 18. NASH/Fatty liver: LFTs reviewed and normalized 19. Dense left sided hemiplegia: [place] regarding NMES- started trials Friday. They have TENS unit at home.   20. Hypokalemia: supplement 34meq 7/7 and repeat BMP reviewed, K+ improved to 3.8. Continue daily 70meq supplement and repeat BMP tomorrow.  21. Leukocytosis: trending upward. UA obtained and negative. F/u  UC. Obtain CXR today. Repeat CBC with diff tomorrow.  22. Vitamin D deficiency: start ergocalciferol 50,000U weekly for 7 weeks.  23. Disposition: Lives with husband and daughter (autistic but high functioning)- husband very involved and has already coordinated with his boss to take time off to care for her. Messaged April to schedule HFU/TC.  LOS: 3 days A FACE TO FACE EVALUATION WAS PERFORMED  Yina Riviere P Frederick Klinger 07/01/2021, 9:04 AM

## 2021-07-02 ENCOUNTER — Inpatient Hospital Stay (HOSPITAL_COMMUNITY): Payer: Managed Care, Other (non HMO)

## 2021-07-02 LAB — BASIC METABOLIC PANEL
Anion gap: 8 (ref 5–15)
Anion gap: 9 (ref 5–15)
BUN: 12 mg/dL (ref 6–20)
BUN: 13 mg/dL (ref 6–20)
CO2: 31 mmol/L (ref 22–32)
CO2: 27 mmol/L (ref 22–32)
Calcium: 9.9 mg/dL (ref 8.9–10.3)
Calcium: 9.9 mg/dL (ref 8.9–10.3)
Chloride: 100 mmol/L (ref 98–111)
Chloride: 102 mmol/L (ref 98–111)
Creatinine, Ser: 0.84 mg/dL (ref 0.44–1.00)
Creatinine, Ser: 0.86 mg/dL (ref 0.44–1.00)
GFR, Estimated: >60 mL/min (ref >60)
GFR, Estimated: >60 mL/min (ref >60)
Glucose, Bld: 60 mg/dL — ABNORMAL LOW (ref 70–99)
Glucose, Bld: 68 mg/dL — ABNORMAL LOW (ref 70–99)
Potassium: 4 mmol/L (ref 3.5–5.1)
Potassium: 4.4 mmol/L (ref 3.5–5.1)
Sodium: 139 mmol/L (ref 135–145)
Sodium: 138 mmol/L (ref 135–145)

## 2021-07-02 LAB — GLUCOSE, CAPILLARY
Glucose-Capillary: 58 mg/dL — ABNORMAL LOW (ref 70–99)
Glucose-Capillary: 73 mg/dL (ref 70–99)
Glucose-Capillary: 82 mg/dL (ref 70–99)
Glucose-Capillary: 173 mg/dL — ABNORMAL HIGH (ref 70–99)
Glucose-Capillary: 179 mg/dL — ABNORMAL HIGH (ref 70–99)

## 2021-07-02 LAB — CBC WITH DIFFERENTIAL/PLATELET
Abs Immature Granulocytes: 0.24 10*3/uL — ABNORMAL HIGH (ref 0.00–0.07)
Abs Immature Granulocytes: 0.28 10*3/uL — ABNORMAL HIGH (ref 0.00–0.07)
Basophils Absolute: 0.1 10*3/uL (ref 0.0–0.1)
Basophils Absolute: 0.1 10*3/uL (ref 0.0–0.1)
Basophils Relative: 0 %
Basophils Relative: 1 %
Eosinophils Absolute: 0.2 10*3/uL (ref 0.0–0.5)
Eosinophils Absolute: 0.2 10*3/uL (ref 0.0–0.5)
Eosinophils Relative: 1 %
Eosinophils Relative: 2 %
HCT: 38.4 % (ref 36.0–46.0)
HCT: 40.5 % (ref 36.0–46.0)
Hemoglobin: 12.6 g/dL (ref 12.0–15.0)
Hemoglobin: 13.3 g/dL (ref 12.0–15.0)
Immature Granulocytes: 2 %
Immature Granulocytes: 3 %
Lymphocytes Relative: 29 %
Lymphocytes Relative: 22 %
Lymphs Abs: 3.2 10*3/uL (ref 0.7–4.0)
Lymphs Abs: 2.4 10*3/uL (ref 0.7–4.0)
MCH: 30.1 pg (ref 26.0–34.0)
MCH: 30.4 pg (ref 26.0–34.0)
MCHC: 32.8 g/dL (ref 30.0–36.0)
MCHC: 32.8 g/dL (ref 30.0–36.0)
MCV: 91.9 fL (ref 80.0–100.0)
MCV: 92.7 fL (ref 80.0–100.0)
Monocytes Absolute: 0.9 10*3/uL (ref 0.1–1.0)
Monocytes Absolute: 0.6 10*3/uL (ref 0.1–1.0)
Monocytes Relative: 8 %
Monocytes Relative: 5 %
Neutro Abs: 6.7 10*3/uL (ref 1.7–7.7)
Neutro Abs: 7.2 10*3/uL (ref 1.7–7.7)
Neutrophils Relative %: 60 %
Neutrophils Relative %: 67 %
Platelets: 355 10*3/uL (ref 150–400)
Platelets: 385 10*3/uL (ref 150–400)
RBC: 4.18 MIL/uL (ref 3.87–5.11)
RBC: 4.37 MIL/uL (ref 3.87–5.11)
RDW: 14.3 % (ref 11.5–15.5)
RDW: 14.3 % (ref 11.5–15.5)
WBC: 11.2 10*3/uL — ABNORMAL HIGH (ref 4.0–10.5)
WBC: 10.7 10*3/uL — ABNORMAL HIGH (ref 4.0–10.5)
nRBC: 0 % (ref 0.0–0.2)
nRBC: 0 % (ref 0.0–0.2)

## 2021-07-02 MED ORDER — SODIUM CHLORIDE 0.9 % IV SOLN
2.0000 g | Freq: Three times a day (TID) | INTRAVENOUS | Status: DC
  Administered 2021-07-02 (×2): 2 g via INTRAVENOUS
  Filled 2021-07-02 (×3): qty 2

## 2021-07-02 MED ORDER — CARVEDILOL 12.5 MG PO TABS
12.5000 mg | ORAL_TABLET | Freq: Two times a day (BID) | ORAL | Status: DC
  Administered 2021-07-02: 12.5 mg via ORAL
  Filled 2021-07-02: qty 1

## 2021-07-02 MED ORDER — VANCOMYCIN HCL 1250 MG/250ML IV SOLN
1250.0000 mg | Freq: Once | INTRAVENOUS | Status: AC
  Administered 2021-07-02: 1250 mg via INTRAVENOUS
  Filled 2021-07-02: qty 250

## 2021-07-02 MED ORDER — VANCOMYCIN HCL 750 MG/150ML IV SOLN
750.0000 mg | Freq: Two times a day (BID) | INTRAVENOUS | Status: DC
  Filled 2021-07-02: qty 150

## 2021-07-02 MED ORDER — INSULIN GLARGINE 100 UNIT/ML ~~LOC~~ SOLN
41.0000 [IU] | Freq: Two times a day (BID) | SUBCUTANEOUS | Status: DC
  Administered 2021-07-02: 41 [IU] via SUBCUTANEOUS
  Filled 2021-07-02 (×2): qty 0.41

## 2021-07-02 NOTE — Progress Notes (Signed)
PROGRESS NOTE   Subjective/Complaints: No complaints this morning. In the afternoon she started coughing after waking and O2 sat dropped to 72- started on 15L nonrebreather, received breathing treatment, labs and CXR obtained- unremarkable- currently satting 92% on high flow 10.   ROS: +left sided weakness, +depression, +anxiety, +cough   Objective:   DG Chest 2 View  Result Date: 07/02/2021 CLINICAL DATA:  Hypoxia.  Smoker. EXAM: CHEST - 2 VIEW COMPARISON:  07/01/2021. FINDINGS: Stable borderline enlarged cardiac silhouette, post CABG changes and mild diffuse peribronchial thickening and accentuation of the interstitial markings. Unremarkable bones. IMPRESSION: No acute abnormality. Electronically Signed   By: Claudie Revering M.D.   On: 07/02/2021 16:24   DG Chest 2 View  Result Date: 07/01/2021 CLINICAL DATA:  Leukocytosis EXAM: CHEST - 2 VIEW COMPARISON:  06/24/2021 FINDINGS: Prior CABG. Heart is borderline in size. No confluent airspace opacities or effusions. No acute bony abnormality. IMPRESSION: No active cardiopulmonary disease. Electronically Signed   By: Rolm Baptise M.D.   On: 07/01/2021 14:52   Recent Labs    07/02/21 0526 07/02/21 1319  WBC 11.2* 10.7*  HGB 12.6 13.3  HCT 38.4 40.5  PLT 355 385   Recent Labs    07/02/21 0526 07/02/21 1319  NA 139 138  K 4.0 4.4  CL 100 102  CO2 31 27  GLUCOSE 60* 68*  BUN 12 13  CREATININE 0.84 0.86  CALCIUM 9.9 9.9    Intake/Output Summary (Last 24 hours) at 07/02/2021 1630 Last data filed at 07/02/2021 0920 Gross per 24 hour  Intake 236 ml  Output --  Net 236 ml        Physical Exam: Vital Signs Blood pressure 120/69, pulse (!) 58, temperature 97.8 F (36.6 C), temperature source Oral, resp. rate 18, height 5\' 6"  (1.676 m), weight 57.6 kg, SpO2 94 %. Gen: no distress, normal appearing (exam performed in AM prior to decreased O2 sat) HEENT: oral mucosa pink and  moist, NCAT Cardio: Reg rate Chest: normal effort, normal rate of breathing Abd: soft, non-distended Ext: no edema Psych: pleasant, flat affect Skin: intact Neurological:    Mental Status: She is alert and oriented to person, place, and time.    Comments: Left facial weakness with mild dysarthria. Able to answer orientation questions and follow commands without difficulty. Dense left hemiplegia.     Assessment/Plan: 1. Functional deficits which require 3+ hours per day of interdisciplinary therapy in a comprehensive inpatient rehab setting. Physiatrist is providing close team supervision and 24 hour management of active medical problems listed below. Physiatrist and rehab team continue to assess barriers to discharge/monitor patient progress toward functional and medical goals  Care Tool:  Bathing    Body parts bathed by patient: Chest, Abdomen   Body parts bathed by helper: Left upper leg, Right lower leg     Bathing assist Assist Level: Moderate Assistance - Patient 50 - 74%     Upper Body Dressing/Undressing Upper body dressing   What is the patient wearing?: Pull over shirt    Upper body assist Assist Level: Minimal Assistance - Patient > 75%    Lower Body Dressing/Undressing Lower body dressing  What is the patient wearing?: Pants     Lower body assist Assist for lower body dressing: Maximal Assistance - Patient 25 - 49%     Toileting Toileting    Toileting assist Assist for toileting: Moderate Assistance - Patient 50 - 74%     Transfers Chair/bed transfer  Transfers assist     Chair/bed transfer assist level: Moderate Assistance - Patient 50 - 74%     Locomotion Ambulation   Ambulation assist      Assist level: Moderate Assistance - Patient 50 - 74% Assistive device: Hand held assist Max distance: 30   Walk 10 feet activity   Assist     Assist level: Moderate Assistance - Patient - 50 - 74% Assistive device: Hand held assist    Walk 50 feet activity   Assist Walk 50 feet with 2 turns activity did not occur: Safety/medical concerns         Walk 150 feet activity   Assist Walk 150 feet activity did not occur: Safety/medical concerns         Walk 10 feet on uneven surface  activity   Assist Walk 10 feet on uneven surfaces activity did not occur: Safety/medical concerns         Wheelchair     Assist Will patient use wheelchair at discharge?: No             Wheelchair 50 feet with 2 turns activity    Assist            Wheelchair 150 feet activity     Assist          Blood pressure 120/69, pulse (!) 58, temperature 97.8 F (36.6 C), temperature source Oral, resp. rate 18, height 5\' 6"  (1.676 m), weight 57.6 kg, SpO2 94 %.    Medical Problem List and Plan: 1.  Right pontine stroke             -patient may shower             -ELOS/Goals: 2-3 weeks S             -husband may come in before 8am  Continue CIR 2.  Impaired mobility: Continue Lovenox             -antiplatelet therapy: Continue ASA/Plavix 3. Cluster headaches: abortive oxygen PRN. Oxycodone prn for HA 4. Mood: Team to provide ego support. LCSW to follow for evaluation and support.             -antipsychotic agents: Lamictal daily. 5. Neuropsych: This patient is capable of making decisions on her own behalf. 6. Skin/Wound Care: Routine pressure relief measures. 7. Fluids/Electrolytes/Nutrition: Monitor I/O. Check lytes in am. 8. HTN: SBP goal 140-180 range to avoid hypoperfusion of brain. Per husband "acts drunk when BP is low" --Continue Imdur, Coreg, aldactone, Demadex, Per reports 7/10: hypotensive: decrease coreg 9. T2DM: Hgb A1C- 10.7. BS run 200's at home. Dietary education/reinforcement.             --Continue Lantus  45 units BID with SSI for elevated BS             --7/10: hypoglycemic to 58l, decrease Lantus to 42 U. 10. CAD s/p CABG: On ASA, coreg, Cozaar, Demadex, ASA, 11. PAD  w/claudication: Has been smoking 1 PPD --quit at admission. Continue Nicotine patch. --On lyrica BID to manage symptoms.   12. Aspiration PNA: Changed Unasyn to Vanc and Cefepime given desat to 72%, slow to improve with  nonrebreather 15L but now satting 92 on 10L (was a smoker).             --Leucocytosis trending downward. Repeat CXR 7/10 stable. Please ensure all meals are given in chair with supervision- nursing order placed and discussed with nursing.  13. Chronic cluster headaches, currently well controlled: Managed with thorazine as well as oxycodone and high flow oxygen prn. Decreased oxycodone to TID PRN and edited order to indicate it should be given after therapy as per husband's request- makes her too sedated to tolerate therapy.  14. H/o depression/anxiety: Continue Lamictal, Prozac with Klonopin prn. 15. Chronic insomnia: Managed with home dose trazodone. 16. Dyslipidemia: Trig- 636, direct LDL-99.6, HDL 29, Chol 202. On Zetia--to start Vespc 17. Vitamin B 12 deficiency: B12- 169 (was WNL a year ago) 18. NASH/Fatty liver: LFTs reviewed and normalized 19. Dense left sided hemiplegia: [place] regarding NMES- started trials Friday. They have TENS unit at home.  20. Hypokalemia: supplement 41meq 7/7 and repeat BMP reviewed, K+ improved to 3.8. Resolved. D/c daily supplement.  21. Leukocytosis: trending upward. UA obtained and negative. F/u UC. Obtain CXR today. Repeat CBC with diff tomorrow.  22. Vitamin D deficiency: start ergocalciferol 50,000U weekly for 7 weeks.  23. Disposition: Lives with husband and daughter (autistic but high functioning)- husband very involved and has already coordinated with his boss to take time off to care for her. Messaged April to schedule HFU/TC.  LOS: 4 days A FACE TO FACE EVALUATION WAS PERFORMED  Martha Clan P Daley Gosse 07/02/2021, 4:30 PM

## 2021-07-02 NOTE — Progress Notes (Signed)
Pharmacy Antibiotic Note  Erika [name] is a 50 y.o. female admitted to [facility] on 06/28/2021 s/p stroke who was on Unasyn for aspiration pneumonia.  Pharmacy has been consulted for vancomycin and cefepime dosing to broaden coverage after patient had a desaturation necessitating a rapid response call.  CXR neg, afebrile, WBC up to 10.7, renal function stable.  CTX x1 7/2 Doxy 72/ >> 7/3 Unasyn 7/3 >> 7/10 Vanc 7/10 >> Cefepime 7/10 >>   7/7 UCx: neg 7/3 BCx: neg  Plan: Vancomycin 1250 mg IV load then 750 mg IV q12h Goal AUC 400-550. Expected AUC: 516.6 SCr used: 0.8 Cefepime 2 g IV q8h Monitor renal function, clinical progress, cultures/sensitivities F/U LOT and de-escalate as able Vancomycin levels as clinically indicated   Height: 5\' 6"  (167.6 cm) Weight: 57.6 kg (126 lb 15.8 oz) IBW/kg (Calculated) : 59.3  Temp (24hrs), Avg:97.9 F (36.6 C), Min:97.8 F (36.6 C), Max:98.2 F (36.8 C)  Recent Labs  Lab 06/26/21 0616 06/28/21 0605 06/29/21 0532 06/30/21 0528 07/01/21 0515 07/02/21 0526  WBC 11.5* 10.3 11.5* 13.3* 13.4* 11.2*  CREATININE 0.57 0.57 0.71 0.64  --  0.84    Estimated Creatinine Clearance: 75.3 mL/min (by C-G formula based on SCr of 0.84 mg/dL).    Allergies  Allergen Reactions   Chantix [Varenicline Tartrate] Other (See Comments)    Feels "crazy" and angry     Thank you for involving pharmacy in this patient's care.  Renold Genta, PharmD, BCPS Clinical Pharmacist Clinical phone for 07/02/2021 until 3p is [phone] 07/02/2021 1:15 PM  **Pharmacist phone directory can be found on [email or web address] listed under [facility]**

## 2021-07-02 NOTE — Progress Notes (Signed)
Family member call this RN in the room and said patient woke up from sleep coughing. When assessed patient was breathing heavenly, O2 sat in the 70s. Patient placed on NRB 15L and breathing treatment given, O2 sat came up in the 80s. RR call MD notified, multiple order given (see order). RT came and place patient on High flow nasal cannula. We continue to monitor.

## 2021-07-02 NOTE — Progress Notes (Signed)
Speech Language Pathology Daily Session Note  Patient Details  Name: Erika [name] MRN: [id number] Date of Birth: 1971/06/05  Today's Date: 07/02/2021 SLP Individual Time: 4825-0037 SLP Individual Time Calculation (min): 45 min  Short Term Goals: Week 1: SLP Short Term Goal 1 (Week 1): Patient will tolerate Dys 3 solids, thin liquids without overt s/s aspiration or penetration and with consistent following of swallow precautions (small sips via straw or cup). SLP Short Term Goal 2 (Week 1): Patient will tolerate trials of regular solids textures with minimal cues for clearing oral cavity and checking for left sided oral pocketing. SLP Short Term Goal 3 (Week 1): Patient will utilize speech strategies (slow down rate, overarticulate) in structured conversation with min-modA cues. SLP Short Term Goal 4 (Week 1): Patient will perfrom mildly complex problem solving tasks in mildly distracting environments with modA cues to redirect. SLP Short Term Goal 5 (Week 1): Patient will demonstrate daily recall of specific medical and therapeutic interventions (exercises, strategies, etc.) with minA cues.  Skilled Therapeutic Interventions: Pt seen for skilled ST with focus on dysphagia and cognitive goals, husband and daughter present throughout. Pt seen at AM meal and with med pass this morning. Pt tolerating ~15 pills whole with liquid wash with 1 episode immediate cough after swallow. Pt stated "I'm ok, I took too much water", no other difficulty noted. Pt sitting EOB and continues to tolerate Dys 3/thin diet with min-supervision level cues for use of compensatory swallow strategies as trained, primarily not talking while eating. Husband discussed bringing in patient's favorite BBQ to trial during SLP session this week, will plan accordingly. SLP facilitating mildly complex problem solving with focus on discharge environment (utilizing family members for assist, potential hazard recognition, fall  prevention, gun safety) by providing min cues for 90% accuracy. Pt does demonstrate anticipatory awareness into current cognitive and physical deficits and their impacts at home. Pt left with husband and daughter present and all needs met. Cont ST POC.   Pain Pain Assessment Pain Scale: 0-10 Pain Score: 0-No pain  Therapy/Group: Individual Therapy  Dewaine Conger 07/02/2021, 9:59 AM

## 2021-07-02 NOTE — Progress Notes (Signed)
RT called to bedside by rapid response team due to pt desat. and on NRB. RT placed patient on High flow nasal cannula 10L, pt SpO2 94%. No complications, Vitals are stable, RN aware, RT will continue to monitor.

## 2021-07-03 ENCOUNTER — Inpatient Hospital Stay (HOSPITAL_COMMUNITY): Payer: Managed Care, Other (non HMO)

## 2021-07-03 DIAGNOSIS — J9601 Acute respiratory failure with hypoxia: Secondary | ICD-10-CM

## 2021-07-03 LAB — CBC
HCT: 37.2 % (ref 36.0–46.0)
Hemoglobin: 12 g/dL (ref 12.0–15.0)
MCH: 30 pg (ref 26.0–34.0)
MCHC: 32.3 g/dL (ref 30.0–36.0)
MCV: 93 fL (ref 80.0–100.0)
Platelets: 380 10*3/uL (ref 150–400)
RBC: 4 MIL/uL (ref 3.87–5.11)
RDW: 14.3 % (ref 11.5–15.5)
WBC: 21.2 10*3/uL — ABNORMAL HIGH (ref 4.0–10.5)
nRBC: 0 % (ref 0.0–0.2)

## 2021-07-03 LAB — GLUCOSE, CAPILLARY
Glucose-Capillary: 98 mg/dL (ref 70–99)
Glucose-Capillary: 73 mg/dL (ref 70–99)
Glucose-Capillary: 96 mg/dL (ref 70–99)
Glucose-Capillary: 119 mg/dL — ABNORMAL HIGH (ref 70–99)
Glucose-Capillary: 72 mg/dL (ref 70–99)

## 2021-07-03 LAB — AMMONIA: Ammonia: 38 umol/L — ABNORMAL HIGH (ref 9–35)

## 2021-07-03 LAB — URINALYSIS, ROUTINE W REFLEX MICROSCOPIC
Bilirubin Urine: NEGATIVE
Glucose, UA: NEGATIVE mg/dL
Hgb urine dipstick: NEGATIVE
Ketones, ur: NEGATIVE mg/dL
Leukocytes,Ua: NEGATIVE
Nitrite: NEGATIVE
Protein, ur: NEGATIVE mg/dL
Specific Gravity, Urine: 1.016 (ref 1.005–1.030)
pH: 6 (ref 5.0–8.0)

## 2021-07-03 LAB — BLOOD GAS, ARTERIAL
Acid-Base Excess: 2.3 mmol/L — ABNORMAL HIGH (ref 0.0–2.0)
Bicarbonate: 26.8 mmol/L (ref 20.0–28.0)
Drawn by: 336832
FIO2: 32
O2 Saturation: 87.9 %
Patient temperature: 37
pCO2 arterial: 45.1 mmHg (ref 32.0–48.0)
pH, Arterial: 7.392 (ref 7.350–7.450)
pO2, Arterial: 55.8 mmHg — ABNORMAL LOW (ref 83.0–108.0)

## 2021-07-03 LAB — BASIC METABOLIC PANEL
Anion gap: 5 (ref 5–15)
BUN: 16 mg/dL (ref 6–20)
CO2: 29 mmol/L (ref 22–32)
Calcium: 9.1 mg/dL (ref 8.9–10.3)
Chloride: 98 mmol/L (ref 98–111)
Creatinine, Ser: 1.08 mg/dL — ABNORMAL HIGH (ref 0.44–1.00)
GFR, Estimated: >60 mL/min (ref >60)
Glucose, Bld: 141 mg/dL — ABNORMAL HIGH (ref 70–99)
Potassium: 4 mmol/L (ref 3.5–5.1)
Sodium: 132 mmol/L — ABNORMAL LOW (ref 135–145)

## 2021-07-03 LAB — PROCALCITONIN: Procalcitonin: 0.12 ng/mL

## 2021-07-03 LAB — LACTIC ACID, PLASMA
Lactic Acid, Venous: 0.8 mmol/L (ref 0.5–1.9)
Lactic Acid, Venous: 0.7 mmol/L (ref 0.5–1.9)

## 2021-07-03 MED ORDER — SODIUM CHLORIDE 0.9 % IV BOLUS
500.0000 mL | Freq: Once | INTRAVENOUS | Status: AC
  Administered 2021-07-03: 500 mL via INTRAVENOUS

## 2021-07-03 MED ORDER — CARVEDILOL 6.25 MG PO TABS
6.2500 mg | ORAL_TABLET | Freq: Two times a day (BID) | ORAL | Status: DC
  Administered 2021-07-03 – 2021-07-05 (×4): 6.25 mg via ORAL
  Filled 2021-07-03 (×6): qty 1

## 2021-07-03 MED ORDER — SODIUM CHLORIDE 0.9 % IV SOLN
3.0000 g | Freq: Four times a day (QID) | INTRAVENOUS | Status: DC
  Administered 2021-07-03 – 2021-07-14 (×42): 3 g via INTRAVENOUS
  Filled 2021-07-03: qty 3
  Filled 2021-07-03 (×2): qty 8
  Filled 2021-07-03 (×3): qty 3
  Filled 2021-07-03 (×3): qty 8
  Filled 2021-07-03 (×2): qty 3
  Filled 2021-07-03: qty 8
  Filled 2021-07-03: qty 3
  Filled 2021-07-03: qty 8
  Filled 2021-07-03: qty 3
  Filled 2021-07-03: qty 8
  Filled 2021-07-03 (×3): qty 3
  Filled 2021-07-03: qty 8
  Filled 2021-07-03 (×2): qty 3
  Filled 2021-07-03: qty 8
  Filled 2021-07-03 (×2): qty 3
  Filled 2021-07-03: qty 8
  Filled 2021-07-03 (×5): qty 3
  Filled 2021-07-03 (×2): qty 8
  Filled 2021-07-03: qty 3
  Filled 2021-07-03: qty 8
  Filled 2021-07-03: qty 3
  Filled 2021-07-03 (×2): qty 8
  Filled 2021-07-03: qty 3
  Filled 2021-07-03: qty 8
  Filled 2021-07-03 (×2): qty 3
  Filled 2021-07-03: qty 8
  Filled 2021-07-03 (×2): qty 3
  Filled 2021-07-03 (×2): qty 8

## 2021-07-03 MED ORDER — INSULIN GLARGINE 100 UNIT/ML ~~LOC~~ SOLN
35.0000 [IU] | Freq: Two times a day (BID) | SUBCUTANEOUS | Status: DC
  Administered 2021-07-03: 35 [IU] via SUBCUTANEOUS
  Filled 2021-07-03 (×2): qty 0.35

## 2021-07-03 MED ORDER — FLEET ENEMA 7-19 GM/118ML RE ENEM: 1.0000 | ENEMA | Freq: Every day | RECTAL | Status: DC | PRN

## 2021-07-03 MED ORDER — INSULIN GLARGINE 100 UNIT/ML ~~LOC~~ SOLN
33.0000 [IU] | Freq: Two times a day (BID) | SUBCUTANEOUS | Status: DC
  Administered 2021-07-03 – 2021-07-06 (×7): 33 [IU] via SUBCUTANEOUS
  Filled 2021-07-03 (×9): qty 0.33

## 2021-07-03 MED ORDER — SODIUM CHLORIDE 0.9 % IV SOLN: INTRAVENOUS | Status: DC

## 2021-07-03 MED ORDER — SORBITOL 70 % SOLN
30.0000 mL | Freq: Every day | Status: DC | PRN
  Administered 2021-07-03 – 2021-07-15 (×2): 30 mL via ORAL
  Filled 2021-07-03 (×2): qty 30

## 2021-07-03 MED ORDER — IOHEXOL 350 MG/ML SOLN
50.0000 mL | Freq: Once | INTRAVENOUS | Status: AC | PRN
  Administered 2021-07-03: 50 mL via INTRAVENOUS

## 2021-07-03 NOTE — Consult Note (Signed)
Medical Consultation   Erika [name]  LFY:[id number]  DOB: 04/03/1971  DOA: 06/28/2021  PCP: Lesleigh Noe, MD   Outpatient Specialists: Dossie Arbour - pain med; Murdock - psychiatry; Loanne Drilling - endocrinology; Early - vascular; Arida - cardiology   Requesting physician: Kirsteins - CIR  Reason for consultation: In CIR for CVA, has multiple medical comorbidities.  Has had delirium - better on arrival but increased O2 requirements and confusion while there.  CXR ok.  ?volume overload.  Echo fine.  Requests a ssistance with med management, no current request for transfer as she appears to be stable at this time.    History of Present Illness: Erika [name] is an 50 y.o. female with h/o HTN; HLD; DM; PAD; CAD s/p CABG; chronic diastolic CHF; carotid stenosis s/p CEA; and PTSD/adjustment d/o who was hospitalized from 7/2-6 for acute CVA and was discharged to CIR.  While hospitalized, she was also noted to have severe sepsis due to RUL PNA causing respiratory failure with hypoxia, treated with Rocephin/Doxy -> Unasyn, completed 5 days of therapy.  Her husband reports that she was participating very well in therapy on Friday but has been increasingly confused and somnolent during the weekend.  She also developed new onset hypoxia.  He does report persistent choking/coughing/sputtering with eating, although she was able to drink water without difficulty while I was at the bedside.  She was somnolent but was able to wake up and answer questions, oriented x 3.  She denies specific complaints.   Review of Systems:  ROS As per HPI otherwise 10 point review of systems negative.    Past Medical History: Past Medical History:  Diagnosis Date   Arthralgia of temporomandibular joint    CAD, multiple vessel    a. 06/2016 Cath: ostLM 40%, ostLAD 40%, pLAD 95%, ost-pLCx 60%, pLCx 95%, mLCx 60%, mRCA 95%, D2 50%, LVSF nl;  b. 07/2016 CABG x 4 (LIMA->LAD, VG->Diag, VG->OM, VG->RCA); c.  08/2016 Cath: 3VD w/ 4/4 patent grafts. LAD distal to LIMA has diff dzs->Med rx; d. 08/2020 Cath: 4/4 patent grafts, native 3VD. EF 55-65%-->Med Rx.   Carotid arterial disease ([place])    a. 07/2016 s/p R CEA; b. 02/2021 U/S: RICA 85-27%, LICA 7-82%.   Clotting disorder ([place])    Depression    Diastolic dysfunction    a. 06/2016 Echo: EF 50-55%, mild inf wall HK, GR1DD, mild MR, RV sys fxn nl, mildly dilated LA, PASP nl   Fatty liver disease, nonalcoholic 4235   History of blood transfusion    with heart surgery   HLD (hyperlipidemia)    Labile hypertension    a. prior renal ngiogram negative for RAS in 03/2016; b. catecholamines and metanephrines normal, mildly elevated renin with normal aldosterone and normal ratio in 02/2016   Myocardial infarction [facility]) 2017   Obesity    PAD (peripheral artery disease) ([place])    a. 09/2018 s/p L SFA stenting; b. 07/2019 Periph Angio: Patent m/d L SFA stent w/ 100% L SFA distal to stent. L AT 100d, L Peroneal diff dzs-->Med Rx; c. 02/2021 ABIs: stable @ 0.61 on R and 0.46 on L.   PTSD (post-traumatic stress disorder)    Tobacco abuse    Type 2 diabetes mellitus (Erika [name]) 12/2015    Past Surgical History: Past Surgical History:  Procedure Laterality Date   ABDOMINAL AORTOGRAM W/LOWER EXTREMITY [name] 10/15/2018   Procedure: ABDOMINAL AORTOGRAM W/LOWER EXTREMITY;  Surgeon: Wellington Hampshire, MD;  Location: [place] [facility];  Service: Cardiovascular;  Laterality: [name];   ABDOMINAL AORTOGRAM W/LOWER EXTREMITY Bilateral 08/19/2019   Procedure: ABDOMINAL AORTOGRAM W/LOWER EXTREMITY;  Surgeon: Wellington Hampshire, MD;  Location: [place] [facility];  Service: Cardiovascular;  Laterality: Bilateral;   CARDIAC CATHETERIZATION [name] 06/29/2016   Procedure: Left Heart Cath and Coronary Angiography;  Surgeon: Minna Merritts, MD;  Location: [place] [facility];  Service: Cardiovascular;  Laterality: [name];   CARDIAC CATHETERIZATION [name] 08/29/2016   Procedure: Left Heart Cath and  Cors/Grafts Angiography;  Surgeon: Wellington Hampshire, MD;  Location: [place] [facility];  Service: Cardiovascular;  Laterality: [name];   CESAREAN SECTION     CHOLECYSTECTOMY     CORONARY ARTERY BYPASS GRAFT [name] 07/06/2016   Procedure: CORONARY ARTERY BYPASS GRAFTING (CABG) x four, using left internal mammary artery and right leg greater saphenous vein harvested endoscopically;  Surgeon: Ivin Poot, MD;  Location: [place];  Service: Open Heart Surgery;  Laterality: [name];   ENDARTERECTOMY Right 07/06/2016   Procedure: ENDARTERECTOMY CAROTID;  Surgeon: Rosetta Posner, MD;  Location: [facility] OR;  Service: Vascular;  Laterality: Right;   ENDARTERECTOMY Right 04/27/2020   Procedure: REDO OF RIGHT ENDARTERECTOMY CAROTID;  Surgeon: Rosetta Posner, MD;  Location: [facility] OR;  Service: Vascular;  Laterality: Right;   LEFT HEART CATH AND CORS/GRAFTS ANGIOGRAPHY [name] 08/24/2020   Procedure: LEFT HEART CATH AND CORS/GRAFTS ANGIOGRAPHY;  Surgeon: Wellington Hampshire, MD;  Location: [place] [facility];  Service: Cardiovascular;  Laterality: [name];   PERIPHERAL VASCULAR CATHETERIZATION [name] 04/18/2016   Procedure: Renal Angiography;  Surgeon: Wellington Hampshire, MD;  Location: [place] [facility];  Service: Cardiovascular;  Laterality: [name];   PERIPHERAL VASCULAR INTERVENTION Left 10/15/2018   Procedure: PERIPHERAL VASCULAR INTERVENTION;  Surgeon: Wellington Hampshire, MD;  Location: [place] [facility];  Service: Cardiovascular;  Laterality: Left;  Left superficial femoral   TEE WITHOUT CARDIOVERSION [name] 07/06/2016   Procedure: TRANSESOPHAGEAL ECHOCARDIOGRAM (TEE);  Surgeon: Ivin Poot, MD;  Location: [place];  Service: Open Heart Surgery;  Laterality: [name];   TONSILLECTOMY       Allergies:   Allergies  Allergen Reactions   Chantix [Varenicline Tartrate] Other (See Comments)    Feels "crazy" and angry      Social History:  reports that she has been smoking cigarettes. She has a 13.50 pack-year smoking history. She has never used  smokeless tobacco. She reports previous alcohol use. She reports that she does not use drugs.   Family History: Family History  Adopted: Yes  Problem Relation Age of Onset   Diabetes Mother    Diabetes Father    Alcohol abuse Father    Heart disease Father    Drug abuse Father    Stroke Sister    Anxiety disorder Sister       Physical Exam: Vitals:   07/03/21 0451 07/03/21 0805 07/03/21 0807 07/03/21 0815  BP: 105/63 (!) 85/53 (!) 81/52 (!) 85/54  Pulse: 72 68 70 67  Resp: 20  18   Temp: 98.5 F (36.9 C)  98.6 F (37 C)   TempSrc: Oral  Oral   SpO2: 98%  92% 92%  Weight:      Height:        Constitutional: Somnolent but Awake, oriented x3, not in any acute distress. Eyes: EOMI, irises appear normal, anicteric sclera ENMT: external ears and nose appear normal, normal hearing, Lips appear normal,  oropharynx mucosa Neck: neck appears normal, no masses, normal ROM CVS: S1-S2 clear, no murmur rubs or gallops, no LE edema, normal pedal pulses  Respiratory:  clear to auscultation bilaterally, no wheezing, rales or rhonchi. Respiratory effort normal. No accessory muscle use. On 8L HFNC O2. Abdomen: soft nontender, nondistended Musculoskeletal: : no cyanosis, clubbing or edema noted bilaterally Neuro: L facial weakness (fairly subtle), L hemiplegia, +dysarthria Psych: judgement and insight appear normal, stable mood and affect, mental status, somnolent but not apparently confused Skin: no rashes or lesions or ulcers, no induration or nodules    Data reviewed:  I have personally reviewed the recent labs and imaging studies  Pertinent Labs:   Na++ 132 BUN 16/Creatinine 1.08/GFR >60 WBC 21.2; 10.7 yesterday Lactate 0.8 Procalcitonin 0.12   Inpatient Medications:   Scheduled Meds:  aspirin  81 mg Oral Daily   benztropine  0.5 mg Oral QHS   carvedilol  6.25 mg Oral BID WC   chlorproMAZINE  100 mg Oral QHS   clopidogrel  75 mg Oral Daily   cyanocobalamin  1,000 mcg  Subcutaneous Q30 days   dextromethorphan-guaiFENesin  1 tablet Oral BID   enoxaparin (LOVENOX) injection  40 mg Subcutaneous Q24H   ezetimibe  10 mg Oral Daily   FLUoxetine  60 mg Oral Daily   insulin aspart  0-5 Units Subcutaneous QHS   insulin aspart  0-9 Units Subcutaneous TID WC   insulin glargine  33 Units Subcutaneous BID   isosorbide mononitrate  60 mg Oral BID   lamoTRIgine  200 mg Oral QHS   losartan  25 mg Oral Daily   nicotine  21 mg Transdermal Daily   pregabalin  225 mg Oral BID   rosuvastatin  20 mg Oral Daily   spironolactone  25 mg Oral Daily   thiamine  100 mg Oral Daily   topiramate  25 mg Oral Daily   torsemide  20 mg Oral Daily   traZODone  50 mg Oral QHS   Vitamin D (Ergocalciferol)  50,000 Units Oral Q7 days   Continuous Infusions:  sodium chloride Stopped (07/03/21 0437)     Radiological Exams on Admission: DG Chest 2 View  Result Date: 07/02/2021 CLINICAL DATA:  Hypoxia.  Smoker. EXAM: CHEST - 2 VIEW COMPARISON:  07/01/2021. FINDINGS: Stable borderline enlarged cardiac silhouette, post CABG changes and mild diffuse peribronchial thickening and accentuation of the interstitial markings. Unremarkable bones. IMPRESSION: No acute abnormality. Electronically Signed   By: Claudie Revering M.D.   On: 07/02/2021 16:24   DG Chest 2 View  Result Date: 07/01/2021 CLINICAL DATA:  Leukocytosis EXAM: CHEST - 2 VIEW COMPARISON:  06/24/2021 FINDINGS: Prior CABG. Heart is borderline in size. No confluent airspace opacities or effusions. No acute bony abnormality. IMPRESSION: No active cardiopulmonary disease. Electronically Signed   By: Rolm Baptise M.D.   On: 07/01/2021 14:52   CT HEAD WO CONTRAST  Result Date: 07/03/2021 CLINICAL DATA:  Neuro deficit with acute stroke suspected. EXAM: CT HEAD WITHOUT CONTRAST TECHNIQUE: Contiguous axial images were obtained from the base of the skull through the vertex without intravenous contrast. COMPARISON:  Brain MRI 06/27/2021 FINDINGS:  Brain: Recent right-sided pontine perforator infarct. No hemorrhage or visible progression from brain MRI. Remote right parietal and parietooccipital infarcts. No evidence of acute infarct, hemorrhage, hydrocephalus, or collection. Vascular: No hyperdense vessel or unexpected calcification. Skull: Normal. Negative for fracture or focal lesion. Sinuses/Orbits: Negative IMPRESSION: 1. Stable compared to brain MRI 06/27/2021. 2. Subacute right pontine infarct. 3.  Remote right parietal and parietooccipital cortex infarcts. Electronically Signed   By: Monte Fantasia M.D.   On: 07/03/2021 06:18    Impression/Recommendations Principal Problem:   Right pontine stroke ([place])  CVA -Young but medically complex patient who presented with acute CVA -Still with significant left-sided deficits -Dysarthria was improving but appears to be worse currently  -Also with prior h/o aspiration PNA so recurrent dysphagia is a concern -Continue PT/OT/ST in CIR currently, if possible  Hypoxia -Had aspiration PNA during prior hospitalization which was adequately treated -She is at ongoing risk of aspiration -Developed waxing and waning hypoxia, on O2 supplementation -She does continue to smoke, COPD is a consideration -No evidence of volume overload currently on exam -?leukocytosis without frank concern for infection; aspiration PNA would be more likely than infectious -PE is a consideration -No current concern for sepsis  -Will check ABG, CTA -Continue O2 supplementation -Will add continuous pulse ox monitoring  Delirium -Appears to be worsening -Current BP is mildly low, may be contributing - will bolus 500 cc and continue to follow -Hypoxia is also a consideration, ABG pending -Most likely this is associated with a medical reason, will continue to evaluate -She is also taking Topamax, and this appears to be a new medication - this medication can contribute to somnolence, and also lowers the seizure threshold  and so should be monitored  CAD/PAD -Continue ASA, Plavix -Continue Imdur  HTN -BP goal is 140-180, according to neurology -Current meds: Losartan, Spironolactone, Toresemide, Coreg -Amlodipine, Clonidine were previously prescribed but currently being held  HLD -Continue Crestor, Zetia -Has been on Repatha, last dose was 6/26  DM -Recent A1c was 11.5, indicating very poor control -Trulicity, Farxiga, and glargine continued -Cover with moderate-scale SSI   Tobacco dependence -Encourage cessation.   -Patch ordered -Likely has underlying COPD, which may be contributing to hypoxia - but no obvious wheezing on exam currently -Continue prn Albuterol  Mood d/o -Continue thorazine, Klonopin, Lamictal, Cogentin, Prozac  Chronic pain -I have reviewed this patient in the [place] Controlled Substances Reporting System.  She is receiving medications from two providers but appears to be taking them as prescribed. -She is at particularly high risk of opioid misuse, diversion, or overdose.  -Continue Oxy IR, Lyrica but this may also contribute to somnolence, delirium and should be monitored     Thank you for this consultation.  Our [facility] hospitalist team will follow the patient with you.   Time Spent: 60 minutes   Karmen Bongo M.D. [facility] 07/03/2021, 9:19 AM

## 2021-07-03 NOTE — Progress Notes (Signed)
Physical Therapy Session Note  Patient Details  Name: Erika [name] MRN: [id number] Date of Birth: 1971/06/23  Today's Date: 07/03/2021 PT Individual Time: 3762-8315 PT Individual Time Calculation (min): 43 min   Short Term Goals: Week 1:  PT Short Term Goal 1 (Week 1): Pt will perform bed mobility with minA. PT Short Term Goal 2 (Week 1): Pt will perform sit to stand transfer with minA. PT Short Term Goal 3 (Week 1): Pt perform bed to chair transfer with minA. PT Short Term Goal 4 (Week 1): Pt will ambulate x25' with modA.  Skilled Therapeutic Interventions/Progress Updates:   Received pt supine in bed with husband present at bedside, pt agreeable to PT treatment, and denied any pain during session. Pt reported feeling much better this afternoon. RN present requesting orthostatics be taken per MD request; see details below (taken on RA): Supine: BP 98/62, O2 sat 93%, HR 58 bpm  Sitting EOB: BP 106/63, O2 sat 93%, HR 60bpm Standing with min A: BP: 133/006, HR 60bpm, O2 93%.  Pt rolled L with min A and transferred supine<>sitting EOB with mod A with cues for LUE management and attention. Sit<>stand with mod A and min A to maintain static standing balance to take vitals. Stand/squat<>pivot bed<>WC to L with mod A. Of note, pt impulsive with transfer and initiating before therapist was ready resulting in pt landing halfway in between on WC rim. Required mod A to scoot hips back in WC. Pt performed WC mobility 183ft using L hemi technique with min A and cues for propulsion technique/sequencing and to increase propulsion stride. Concluded session with pt sitting in WC, needs within reach, and seatbelt alarm on. Husband present at bedside.   Therapy Documentation Precautions:  Precautions Precautions: Fall Precaution Comments: L hemiplegia Restrictions Weight Bearing Restrictions: No LUE Weight Bearing: Weight bearing as tolerated LLE Weight Bearing: Weight bearing as  tolerated   Therapy/Group: Individual Therapy Alfonse Alpers PT, DPT   07/03/2021, 7:42 AM

## 2021-07-03 NOTE — Progress Notes (Signed)
ABG shows hypoxia but otherwise unremarkable. CTA with concern for aspiration PNA again.   Will make NPO for now and start Unasyn.  TRH will continue to follow.   Carlyon Shadow, M.D.

## 2021-07-03 NOTE — Progress Notes (Signed)
Speech Language Pathology Daily Session Note  Patient Details  Name: TIANNAH GREENLY MRN: [id number] Date of Birth: 12-Oct-1971  Today's Date: 07/03/2021 SLP Individual Time: 1587-2761 SLP Individual Time Calculation (min): 42 min  Short Term Goals: Week 1: SLP Short Term Goal 1 (Week 1): Patient will tolerate Dys 3 solids, thin liquids without overt s/s aspiration or penetration and with consistent following of swallow precautions (small sips via straw or cup). SLP Short Term Goal 2 (Week 1): Patient will tolerate trials of regular solids textures with minimal cues for clearing oral cavity and checking for left sided oral pocketing. SLP Short Term Goal 3 (Week 1): Patient will utilize speech strategies (slow down rate, overarticulate) in structured conversation with min-modA cues. SLP Short Term Goal 4 (Week 1): Patient will perfrom mildly complex problem solving tasks in mildly distracting environments with modA cues to redirect. SLP Short Term Goal 5 (Week 1): Patient will demonstrate daily recall of specific medical and therapeutic interventions (exercises, strategies, etc.) with minA cues.  Skilled Therapeutic Interventions:Skilled ST services focused on cognitive skills. SLP facilitated verbal problem solving of personal medication list pills consumed x2 a day mod I, however pt required max A verbal cues to slow verbal rate when reading, mod A verbal cues for error awareness and sustained attention. Pt benefited from SLP folding paper to focus on one medication at a time. SLP recommends completing BID pill organizer next session. Pt was left in room with call bell with nurse and husband. SLP recommends to continue skilled services.     Pain Pain Assessment Pain Scale: 0-10 Pain Score: 0-No pain  Therapy/Group: Individual Therapy  Sonyia Muro  [facility] 07/03/2021, 10:54 AM

## 2021-07-03 NOTE — Progress Notes (Signed)
At 0430, patient called requesting assistance to BR. Patient having trouble following directions. Speech more dysarthric. Decreased balance while sitting on EOB. Vitals and CBG obtained. O2 sats 98% on 4L/M, decreased to 2L/M. Stat CT obtained. Updated patient's husband on above. Erika [name] A

## 2021-07-03 NOTE — Progress Notes (Signed)
Speech Language Pathology Daily Session Note  Patient Details  Name: Erika [name] MRN: [id number] Date of Birth: [birth year]  Today's Date: 07/03/2021 SLP Individual Time: 2706-2376 SLP Individual Time Calculation (min): 25 min  Short Term Goals: Week 1: SLP Short Term Goal 1 (Week 1): Patient will tolerate Dys 3 solids, thin liquids without overt s/s aspiration or penetration and with consistent following of swallow precautions (small sips via straw or cup). SLP Short Term Goal 2 (Week 1): Patient will tolerate trials of regular solids textures with minimal cues for clearing oral cavity and checking for left sided oral pocketing. SLP Short Term Goal 3 (Week 1): Patient will utilize speech strategies (slow down rate, overarticulate) in structured conversation with min-modA cues. SLP Short Term Goal 4 (Week 1): Patient will perfrom mildly complex problem solving tasks in mildly distracting environments with modA cues to redirect. SLP Short Term Goal 5 (Week 1): Patient will demonstrate daily recall of specific medical and therapeutic interventions (exercises, strategies, etc.) with minA cues.  Skilled Therapeutic Interventions:Skilled ST services focused on cognitive skills. SLP facilitated medication management skills with BID pill organizer task, with initial instruction to complete pills one at a time pt demonstrated mod I for problem solving and error awareness in filling out pills. Pt however required max A verbal cues to slow verbal rate when reading medication list and mod A verbal cues to monitor verbal errors. Pt was unable to complete task due to time restraints and SLP will complete in upcoming sessions. Pt was left in room with call bell within reach and bed alarm set. SLP recommends to continue skilled services.     Pain Pain Assessment Pain Scale: 0-10 Pain Score: 0-No pain  Therapy/Group: Individual Therapy  Analuisa Tudor  [facility] 07/03/2021, 4:17 PM

## 2021-07-03 NOTE — Progress Notes (Signed)
Pharmacy Antibiotic Note  Erika [name] is a 50 y.o. female admitted on 06/28/2021 with  aspiration PNA .  Pharmacy has been consulted for Unasyn dosing.  ID: asp PNA, 7/9 CXR neg, afeb, WBC up 10.7>>21.2. Scr 1.08 also trending upwards.  CTX x1 7/2 Doxy 72/ >> 7/3 Unasyn 7/3 >> 7/10 Vanc 7/10 >>7/11 Cefepime 7/10 >>7/11 Unasyn 7/11>>  7/7 UCx: neg 7/3 BCx: neg  Plan: Unasyn 3g IV q6 hr. Ok for renal function Pharmacy will sign off. Please reconsult for further dosing assitance.   Height: 5\' 6"  (167.6 cm) Weight: 57.6 kg (126 lb 15.8 oz) IBW/kg (Calculated) : 59.3  Temp (24hrs), Avg:98.5 F (36.9 C), Min:98.2 F (36.8 C), Max:98.7 F (37.1 C)  Recent Labs  Lab 06/29/21 0532 06/30/21 0528 07/01/21 0515 07/02/21 0526 07/02/21 1319 07/03/21 0848 07/03/21 0935 07/03/21 1228  WBC 11.5* 13.3* 13.4* 11.2* 10.7* 21.2*  --   --   CREATININE 0.71 0.64  --  0.84 0.86 1.08*  --   --   LATICACIDVEN  --   --   --   --   --   --  0.8 0.7    Estimated Creatinine Clearance: 58.6 mL/min (A) (by C-G formula based on SCr of 1.08 mg/dL (H)).    Allergies  Allergen Reactions   Chantix [Varenicline Tartrate] Other (See Comments)    Feels "crazy" and angry     Johnice Riebe S. Alford Highland, PharmD, BCPS Clinical Staff Pharmacist [email or web address]  Wayland Salinas 07/03/2021 7:26 PM

## 2021-07-03 NOTE — Progress Notes (Signed)
Received call from [facility] that patient is slower to follow commands this morning, incontinent (was three times yesterday as well), with increased dysarthria. SBP is in 100s and husband states she "appears drunk" when blood pressure is low. Satting 98% of 4L, so was decreased to 2L. Vitals have been stable. Patient is trying to minimize deficits, as husband says is common for her. Ordered CT head to r/o stroke extension, decreased Coreg further to 6.25mg  BID, decreased Lantus further to 35U BID (CBG 98 this AM- only eating 25% of meals). CXR stable, WBC normalized, afebrile, UA normal. Will discontinue antibiotics.

## 2021-07-03 NOTE — Progress Notes (Signed)
This RN contact the PA to ask if patient can take meds wit sip of water and PA said it's ok to give her med. All her night time meds given together because patient is awake and alert. We continue to monitor.

## 2021-07-03 NOTE — Progress Notes (Signed)
PROGRESS NOTE   Subjective/Complaints:  Per husband pt has confusion, denies SOB but had an O2 sat of 70s with cyanosis without c/o dyspnea Pt is awake and alert  Reviewed CT head findings with Husband   ROS: +left sided weakness, +depression, +anxiety, +cough   Objective:   DG Chest 2 View  Result Date: 07/02/2021 CLINICAL DATA:  Hypoxia.  Smoker. EXAM: CHEST - 2 VIEW COMPARISON:  07/01/2021. FINDINGS: Stable borderline enlarged cardiac silhouette, post CABG changes and mild diffuse peribronchial thickening and accentuation of the interstitial markings. Unremarkable bones. IMPRESSION: No acute abnormality. Electronically Signed   By: Claudie Revering M.D.   On: 07/02/2021 16:24   DG Chest 2 View  Result Date: 07/01/2021 CLINICAL DATA:  Leukocytosis EXAM: CHEST - 2 VIEW COMPARISON:  06/24/2021 FINDINGS: Prior CABG. Heart is borderline in size. No confluent airspace opacities or effusions. No acute bony abnormality. IMPRESSION: No active cardiopulmonary disease. Electronically Signed   By: Rolm Baptise M.D.   On: 07/01/2021 14:52   CT HEAD WO CONTRAST  Result Date: 07/03/2021 CLINICAL DATA:  Neuro deficit with acute stroke suspected. EXAM: CT HEAD WITHOUT CONTRAST TECHNIQUE: Contiguous axial images were obtained from the base of the skull through the vertex without intravenous contrast. COMPARISON:  Brain MRI 06/27/2021 FINDINGS: Brain: Recent right-sided pontine perforator infarct. No hemorrhage or visible progression from brain MRI. Remote right parietal and parietooccipital infarcts. No evidence of acute infarct, hemorrhage, hydrocephalus, or collection. Vascular: No hyperdense vessel or unexpected calcification. Skull: Normal. Negative for fracture or focal lesion. Sinuses/Orbits: Negative IMPRESSION: 1. Stable compared to brain MRI 06/27/2021. 2. Subacute right pontine infarct. 3. Remote right parietal and parietooccipital cortex  infarcts. Electronically Signed   By: Monte Fantasia M.D.   On: 07/03/2021 06:18   Recent Labs    07/02/21 0526 07/02/21 1319  WBC 11.2* 10.7*  HGB 12.6 13.3  HCT 38.4 40.5  PLT 355 385    Recent Labs    07/02/21 0526 07/02/21 1319  NA 139 138  K 4.0 4.4  CL 100 102  CO2 31 27  GLUCOSE 60* 68*  BUN 12 13  CREATININE 0.84 0.86  CALCIUM 9.9 9.9     Intake/Output Summary (Last 24 hours) at 07/03/2021 0857 Last data filed at 07/03/2021 0802 Gross per 24 hour  Intake 1577.46 ml  Output --  Net 1577.46 ml         Physical Exam: Vital Signs Blood pressure (!) 85/54, pulse 67, temperature 98.6 F (37 C), temperature source Oral, resp. rate 18, height 5\' 6"  (1.676 m), weight 57.6 kg, SpO2 92 %.  General: No acute distress Mood and affect are appropriate Heart: Regular rate and rhythm no rubs murmurs or extra sounds Lungs: Clear to auscultation, breathing unlabored, no rales or wheezes Crackles at bases bilaterally  Abdomen: Positive bowel sounds, soft nontender to palpation, moderately distended Extremities: No clubbing, cyanosis, or edema Skin: No evidence of breakdown, no evidence of rash  Psych: pleasant, flat affect Skin: intact Neurological:    Mental Status: She is alert and oriented to person, place, and time.    Comments: Left facial weakness with mild dysarthria. Able to answer orientation questions  and follow commands without difficulty. Dense left hemiplegia.     Assessment/Plan: 1. Functional deficits which require 3+ hours per day of interdisciplinary therapy in a comprehensive inpatient rehab setting. Physiatrist is providing close team supervision and 24 hour management of active medical problems listed below. Physiatrist and rehab team continue to assess barriers to discharge/monitor patient progress toward functional and medical goals  Care Tool:  Bathing    Body parts bathed by patient: Chest, Abdomen   Body parts bathed by helper: Left  upper leg, Right lower leg     Bathing assist Assist Level: Moderate Assistance - Patient 50 - 74%     Upper Body Dressing/Undressing Upper body dressing   What is the patient wearing?: Pull over shirt    Upper body assist Assist Level: Minimal Assistance - Patient > 75%    Lower Body Dressing/Undressing Lower body dressing      What is the patient wearing?: Pants     Lower body assist Assist for lower body dressing: Maximal Assistance - Patient 25 - 49%     Toileting Toileting    Toileting assist Assist for toileting: Moderate Assistance - Patient 50 - 74%     Transfers Chair/bed transfer  Transfers assist     Chair/bed transfer assist level: Moderate Assistance - Patient 50 - 74%     Locomotion Ambulation   Ambulation assist      Assist level: Moderate Assistance - Patient 50 - 74% Assistive device: Hand held assist Max distance: 30   Walk 10 feet activity   Assist     Assist level: Moderate Assistance - Patient - 50 - 74% Assistive device: Hand held assist   Walk 50 feet activity   Assist Walk 50 feet with 2 turns activity did not occur: Safety/medical concerns         Walk 150 feet activity   Assist Walk 150 feet activity did not occur: Safety/medical concerns         Walk 10 feet on uneven surface  activity   Assist Walk 10 feet on uneven surfaces activity did not occur: Safety/medical concerns         Wheelchair     Assist Will patient use wheelchair at discharge?: No             Wheelchair 50 feet with 2 turns activity    Assist            Wheelchair 150 feet activity     Assist          Blood pressure (!) 85/54, pulse 67, temperature 98.6 F (37 C), temperature source Oral, resp. rate 18, height 5\' 6"  (1.676 m), weight 57.6 kg, SpO2 92 %.    Medical Problem List and Plan: 1.  Right pontine stroke             -patient may shower             -ELOS/Goals: 2-3 weeks S              -husband may come in before 8am  Continue CIR 2.  Impaired mobility: Continue Lovenox             -antiplatelet therapy: Continue ASA/Plavix 3. Cluster headaches: abortive oxygen PRN. Oxycodone prn for HA 4. Mood: Team to provide ego support. LCSW to follow for evaluation and support.             -antipsychotic agents: Lamictal daily. 5. Neuropsych: This patient is capable of making  decisions on her own behalf. 6. Skin/Wound Care: Routine pressure relief measures. 7. Fluids/Electrolytes/Nutrition: Monitor I/O. Check lytes in am. 8. HTN: SBP goal 140-180 range to avoid hypoperfusion of brain. Per husband "acts drunk when BP is low" --Continue Imdur, Coreg, aldactone, Demadex, Per reports 7/10: hypotensive: decrease coreg Vitals:   07/03/21 0807 07/03/21 0815  BP: (!) 81/52 (!) 85/54  Pulse: 70 67  Resp: 18   Temp: 98.6 F (37 C)   SpO2: 92% 92%   CHeck serum lactate 9. T2DM: Hgb A1C- 10.7. BS run 200's at home. Dietary education/reinforcement.             --Continue Lantus  45 units BID with SSI for elevated BS             --7/10: hypoglycemic to 58l, decrease Lantus to 35U BID CBG (last 3)  Recent Labs    07/02/21 2100 07/03/21 0507 07/03/21 0639  GLUCAP 179* 98 73  CBG a little low will reduce lantus to 33U   10. CAD s/p CABG: On ASA, coreg, Cozaar, Demadex, ASA, 11. PAD w/claudication: Has been smoking 1 PPD --quit at admission. Continue Nicotine patch. --On lyrica BID to manage symptoms.   12. Aspiration PNA: Changed Unasyn to Vanc and Cefepime  O2 sat >90 on 8L wason 2L when pt came to rehab 13. Chronic cluster headaches, currently well controlled: Managed with thorazine as well as oxycodone and high flow oxygen prn. Decreased oxycodone to TID PRN and edited order to indicate it should be given after therapy as per husband's request- makes her too sedated to tolerate therapy.  14. H/o depression/anxiety: Continue Lamictal, Prozac with Klonopin prn. 15. Chronic insomnia:  Managed with home dose trazodone. 16. Dyslipidemia: Trig- 636, direct LDL-99.6, HDL 29, Chol 202. On Zetia--to start Vespc 17. Vitamin B 12 deficiency: B12- 169 (was WNL a year ago) 18. NASH/Fatty liver: LFTs reviewed and normalized 19. Dense left sided hemiplegia: [place] regarding NMES- started trials Friday. They have TENS unit at home.  20. Hypokalemia: supplement 62meq 7/7 and repeat BMP reviewed, K+ improved to 3.8. Resolved. D/c daily supplement.  21. Leukocytosis: trending upward. UA obtained and negative. F/u UC. Obtain CXR today. Repeat CBC with diff tomorrow.  22. Vitamin D deficiency: start ergocalciferol 50,000U weekly for 7 weeks.  23. Disposition: Lives with husband and daughter (autistic but high functioning)- husband very involved and has already coordinated with his boss to take time off to care for her. Messaged April to schedule HFU/TC. 24.  Hypoxia , was not on O2 at admission, has been treated for asp PNA with IV abx for several days and WBC are normalizing.  ? Fluid overload from IV abx, crackles at bases but recent ECHO looked ok  Will ask IM to consult  LOS: 5 days A FACE TO FACE EVALUATION WAS PERFORMED  Charlett Blake 07/03/2021, 8:57 AM

## 2021-07-04 DIAGNOSIS — J690 Pneumonitis due to inhalation of food and vomit: Secondary | ICD-10-CM

## 2021-07-04 DIAGNOSIS — D72829 Elevated white blood cell count, unspecified: Secondary | ICD-10-CM

## 2021-07-04 LAB — GLUCOSE, CAPILLARY
Glucose-Capillary: 84 mg/dL (ref 70–99)
Glucose-Capillary: 90 mg/dL (ref 70–99)
Glucose-Capillary: 104 mg/dL — ABNORMAL HIGH (ref 70–99)
Glucose-Capillary: 145 mg/dL — ABNORMAL HIGH (ref 70–99)
Glucose-Capillary: 204 mg/dL — ABNORMAL HIGH (ref 70–99)

## 2021-07-04 LAB — CBC WITH DIFFERENTIAL/PLATELET
Abs Immature Granulocytes: 0.23 10*3/uL — ABNORMAL HIGH (ref 0.00–0.07)
Basophils Absolute: 0.1 10*3/uL (ref 0.0–0.1)
Basophils Relative: 1 %
Eosinophils Absolute: 0.2 10*3/uL (ref 0.0–0.5)
Eosinophils Relative: 1 %
HCT: 37.8 % (ref 36.0–46.0)
Hemoglobin: 12.2 g/dL (ref 12.0–15.0)
Immature Granulocytes: 2 %
Lymphocytes Relative: 18 %
Lymphs Abs: 2.4 10*3/uL (ref 0.7–4.0)
MCH: 29.8 pg (ref 26.0–34.0)
MCHC: 32.3 g/dL (ref 30.0–36.0)
MCV: 92.2 fL (ref 80.0–100.0)
Monocytes Absolute: 0.8 10*3/uL (ref 0.1–1.0)
Monocytes Relative: 6 %
Neutro Abs: 9.7 10*3/uL — ABNORMAL HIGH (ref 1.7–7.7)
Neutrophils Relative %: 72 %
Platelets: 376 10*3/uL (ref 150–400)
RBC: 4.1 MIL/uL (ref 3.87–5.11)
RDW: 14.2 % (ref 11.5–15.5)
WBC: 13.3 10*3/uL — ABNORMAL HIGH (ref 4.0–10.5)
nRBC: 0 % (ref 0.0–0.2)

## 2021-07-04 LAB — BASIC METABOLIC PANEL
Anion gap: 6 (ref 5–15)
BUN: 10 mg/dL (ref 6–20)
CO2: 27 mmol/L (ref 22–32)
Calcium: 9.5 mg/dL (ref 8.9–10.3)
Chloride: 105 mmol/L (ref 98–111)
Creatinine, Ser: 0.88 mg/dL (ref 0.44–1.00)
GFR, Estimated: >60 mL/min (ref >60)
Glucose, Bld: 87 mg/dL (ref 70–99)
Potassium: 4.1 mmol/L (ref 3.5–5.1)
Sodium: 138 mmol/L (ref 135–145)

## 2021-07-04 LAB — VITAMIN B1: Vitamin B1 (Thiamine): 93.8 nmol/L (ref 66.5–200.0)

## 2021-07-04 LAB — URINE CULTURE: Culture: NO GROWTH

## 2021-07-04 MED ORDER — PANTOPRAZOLE SODIUM 40 MG PO TBEC
40.0000 mg | DELAYED_RELEASE_TABLET | Freq: Every day | ORAL | Status: DC
  Administered 2021-07-04 – 2021-07-31 (×27): 40 mg via ORAL
  Filled 2021-07-04 (×28): qty 1

## 2021-07-04 MED ORDER — LIDOCAINE 5 % EX PTCH
1.0000 | MEDICATED_PATCH | CUTANEOUS | Status: DC
  Administered 2021-07-04 – 2021-07-06 (×3): 1 via TRANSDERMAL
  Filled 2021-07-04 (×7): qty 1

## 2021-07-04 MED ORDER — HYDROCORTISONE 1 % EX CREA
TOPICAL_CREAM | Freq: Two times a day (BID) | CUTANEOUS | Status: DC
  Administered 2021-07-06: 1 via TOPICAL
  Filled 2021-07-04: qty 28

## 2021-07-04 NOTE — Progress Notes (Addendum)
PROGRESS NOTE   Subjective/Complaints: No complaints this morning Would like to shower, discussed with Gregary Signs OT. Would also like platform support for left hand on wheelchair, perhaps sling while at rest- discussed with Pamala Hurry  ROS: +left sided weakness, +depression, +anxiety, +cough, denies any more shortness of breath   Objective:   DG Chest 2 View  Result Date: 07/03/2021 CLINICAL DATA:  Hypoxia. EXAM: CHEST - 2 VIEW COMPARISON:  07/02/2021. FINDINGS: Trachea is midline. Heart is enlarged. Mild right basilar atelectasis. Lungs are otherwise clear. No pleural fluid. Image quality on the lateral view is degraded by motion. IMPRESSION: Mild basilar atelectasis. Electronically Signed   By: Lorin Picket M.D.   On: 07/03/2021 10:10   DG Chest 2 View  Result Date: 07/02/2021 CLINICAL DATA:  Hypoxia.  Smoker. EXAM: CHEST - 2 VIEW COMPARISON:  07/01/2021. FINDINGS: Stable borderline enlarged cardiac silhouette, post CABG changes and mild diffuse peribronchial thickening and accentuation of the interstitial markings. Unremarkable bones. IMPRESSION: No acute abnormality. Electronically Signed   By: Claudie Revering M.D.   On: 07/02/2021 16:24   CT HEAD WO CONTRAST  Result Date: 07/03/2021 CLINICAL DATA:  Neuro deficit with acute stroke suspected. EXAM: CT HEAD WITHOUT CONTRAST TECHNIQUE: Contiguous axial images were obtained from the base of the skull through the vertex without intravenous contrast. COMPARISON:  Brain MRI 06/27/2021 FINDINGS: Brain: Recent right-sided pontine perforator infarct. No hemorrhage or visible progression from brain MRI. Remote right parietal and parietooccipital infarcts. No evidence of acute infarct, hemorrhage, hydrocephalus, or collection. Vascular: No hyperdense vessel or unexpected calcification. Skull: Normal. Negative for fracture or focal lesion. Sinuses/Orbits: Negative IMPRESSION: 1. Stable compared to brain  MRI 06/27/2021. 2. Subacute right pontine infarct. 3. Remote right parietal and parietooccipital cortex infarcts. Electronically Signed   By: Monte Fantasia M.D.   On: 07/03/2021 06:18   CT Angio Chest Pulmonary Embolism (PE) W or WO Contrast  Result Date: 07/03/2021 CLINICAL DATA:  Shortness of breath, PE suspected EXAM: CT ANGIOGRAPHY CHEST WITH CONTRAST TECHNIQUE: Multidetector CT imaging of the chest was performed using the standard protocol during bolus administration of intravenous contrast. Multiplanar CT image reconstructions and MIPs were obtained to evaluate the vascular anatomy. CONTRAST:  78mL OMNIPAQUE IOHEXOL 350 MG/ML SOLN COMPARISON:  June 24, 2021 FINDINGS: Cardiovascular: Satisfactory opacification of the pulmonary arteries to the segmental level. No evidence of pulmonary embolism. Similar enlargement of the main pulmonary artery measuring 3.1 cm. Aortic atherosclerosis without aneurysmal dilation. Normal heart size. No significant pericardial effusion/thickening. No suspicious intracardiac filling defect. Coronary artery calcifications. Surgical changes of prior CABG. Mediastinum/Nodes: No discrete thyroid nodule. No pathologically enlarged mediastinal, hilar or axillary lymph nodes. Patulous fluid-filled esophagus, similar prior. Trachea is unremarkable. Lungs/Pleura: Multifocal nodular opacities for instance in the left upper lobe on image 63/5 left lower lobe on image 67/5 and right lower lobe on image 72/5 and 100/5. Bronchial wall thickening. Scattered atelectasis. No pleural effusion or pneumothorax. Upper Abdomen: No acute abnormality. Musculoskeletal: No chest wall abnormality. No acute or significant osseous findings. Review of the MIP images confirms the above findings. IMPRESSION: 1. No evidence of pulmonary embolism. 2. Multifocal nodular and ground-glass opacities within both lungs, likely infectious  or inflammatory. Which given the fluid-filled patulous appearance of the soft Gus  may be related to aspiration. 3. Similar enlargement of the main pulmonary artery, which can be seen with pulmonary arterial hypertension. 4. Aortic Atherosclerosis (ICD10-I70.0). Electronically Signed   By: [name] Bailiff MD   On: 07/03/2021 18:21   Recent Labs    07/02/21 1319 07/03/21 0848  WBC 10.7* 21.2*  HGB 13.3 12.0  HCT 40.5 37.2  PLT 385 380   Recent Labs    07/02/21 1319 07/03/21 0848  NA 138 132*  K 4.4 4.0  CL 102 98  CO2 27 29  GLUCOSE 68* 141*  BUN 13 16  CREATININE 0.86 1.08*  CALCIUM 9.9 9.1    Intake/Output Summary (Last 24 hours) at 07/04/2021 1041 Last data filed at 07/04/2021 0400 Gross per 24 hour  Intake 1357.89 ml  Output --  Net 1357.89 ml        Physical Exam: Vital Signs Blood pressure 133/67, pulse (!) 58, temperature 98.5 F (36.9 C), temperature source Oral, resp. rate 18, height 5\' 6"  (1.676 m), weight 57.6 kg, SpO2 93 %.  Gen: no distress, normal appearing HEENT: oral mucosa pink and moist, NCAT Cardio: Bradycardic Chest: normal effort, normal rate of breathing Abd: soft, non-distended Ext: no edema  Psych: pleasant, flat affect Skin: intact Neurological:    Mental Status: She is alert and oriented to person, place, and time.    Comments: Left facial weakness with mild dysarthria. Able to answer orientation questions and follow commands without difficulty. Dense left hemiplegia.     Assessment/Plan: 1. Functional deficits which require 3+ hours per day of interdisciplinary therapy in a comprehensive inpatient rehab setting. Physiatrist is providing close team supervision and 24 hour management of active medical problems listed below. Physiatrist and rehab team continue to assess barriers to discharge/monitor patient progress toward functional and medical goals  Care Tool:  Bathing    Body parts bathed by patient: Chest, Abdomen   Body parts bathed by helper: Left upper leg, Right lower leg     Bathing assist Assist Level:  Moderate Assistance - Patient 50 - 74%     Upper Body Dressing/Undressing Upper body dressing   What is the patient wearing?: Pull over shirt    Upper body assist Assist Level: Minimal Assistance - Patient > 75%    Lower Body Dressing/Undressing Lower body dressing      What is the patient wearing?: Pants     Lower body assist Assist for lower body dressing: Maximal Assistance - Patient 25 - 49%     Toileting Toileting    Toileting assist Assist for toileting: Moderate Assistance - Patient 50 - 74%     Transfers Chair/bed transfer  Transfers assist     Chair/bed transfer assist level: Moderate Assistance - Patient 50 - 74%     Locomotion Ambulation   Ambulation assist      Assist level: Moderate Assistance - Patient 50 - 74% Assistive device: Hand held assist Max distance: 30   Walk 10 feet activity   Assist     Assist level: Moderate Assistance - Patient - 50 - 74% Assistive device: Hand held assist   Walk 50 feet activity   Assist Walk 50 feet with 2 turns activity did not occur: Safety/medical concerns         Walk 150 feet activity   Assist Walk 150 feet activity did not occur: Safety/medical concerns         Walk  10 feet on uneven surface  activity   Assist Walk 10 feet on uneven surfaces activity did not occur: Safety/medical concerns         Wheelchair     Assist Will patient use wheelchair at discharge?: Yes Type of Wheelchair: Manual    Wheelchair assist level: Minimal Assistance - Patient > 75% Max wheelchair distance: 139ft    Wheelchair 50 feet with 2 turns activity    Assist        Assist Level: Minimal Assistance - Patient > 75%   Wheelchair 150 feet activity     Assist          Blood pressure 133/67, pulse (!) 58, temperature 98.5 F (36.9 C), temperature source Oral, resp. rate 18, height 5\' 6"  (1.676 m), weight 57.6 kg, SpO2 93 %.    Medical Problem List and Plan: 1.  Right  pontine stroke             -patient may shower             -ELOS/Goals: 2-3 weeks S             -husband may come in before [place] today   2.  Impaired mobility: Continue Lovenox             -antiplatelet therapy: Continue ASA/Plavix 3. Cluster headaches: abortive oxygen PRN. Oxycodone prn for HA 4. Mood: Team to provide ego support. LCSW to follow for evaluation and support.             -antipsychotic agents: Lamictal daily. 5. Neuropsych: This patient is capable of making decisions on her own behalf. 6. Skin/Wound Care: Routine pressure relief measures. 7. Fluids/Electrolytes/Nutrition: Monitor I/O. Check lytes in am. 8. HTN: SBP goal 140-180 range to avoid hypoperfusion of brain. Per husband "acts drunk when BP is low" --Continue Imdur, Coreg, aldactone, Demadex, Per reports 7/10: hypotensive: decrease coreg Vitals:   07/04/21 0619 07/04/21 0959  BP: 114/60 133/67  Pulse: (!) 59 (!) 58  Resp: 16 18  Temp: 98.2 F (36.8 C) 98.5 F (36.9 C)  SpO2: 98% 93%   CHeck serum lactate 9. T2DM: Hgb A1C- 10.7. BS run 200's at home. Dietary education/reinforcement.             --Continue Lantus  45 units BID with SSI for elevated BS             --7/10: hypoglycemic to 58l, decrease Lantus to 35U BID CBG (last 3)  Recent Labs    07/03/21 2127 07/04/21 0620 07/04/21 0957  GLUCAP 72 84 90  CBG a little low will reduce lantus to 33U   10. CAD s/p CABG: On ASA, coreg, Cozaar, Demadex, ASA, 11. PAD w/claudication: Has been smoking 1 PPD --quit at admission. Continue Nicotine patch. --On lyrica BID to manage symptoms.   12. Aspiration PNA: CT reviewed, appreciate medicine consult, WBC up, perhaps new aspiration pneumonia and started on Unasyn IV 7/11. Discussed with SLP who will eval her this afternoon.  13. Chronic cluster headaches, currently well controlled: Managed with thorazine as well as oxycodone and high flow oxygen prn. Decreased  oxycodone to TID PRN and edited order to indicate it should be given after therapy as per husband's request- makes her too sedated to tolerate therapy.  14. H/o depression/anxiety: Continue Lamictal, Prozac with Klonopin prn. 15. Chronic insomnia: Managed with home dose trazodone. 16. Dyslipidemia: Trig- 636, direct LDL-99.6, HDL 29, Chol 202. On Zetia--to start  Vespc 17. Vitamin B 12 deficiency: B12- 169 (was WNL a year ago) 18. NASH/Fatty liver: LFTs reviewed and normalized 19. Dense left sided hemiplegia: [place] regarding NMES- started trials Friday. They have TENS unit at home.  20. Hypokalemia: supplement 75meq 7/7 and repeat BMP reviewed, K+ improved to 3.8. Resolved. D/c daily supplement.  21. Leukocytosis: trending upward potentially secondary to aspiration pneumonia, repeat today 22. Vitamin D deficiency: start ergocalciferol 50,000U weekly for 7 weeks.  23.  Hypoxia , was not on O2 at admission, has been treated for asp PNA with IV abx for several days and WBC are normalizing.  ? Fluid overload from IV abx, crackles at bases but recent ECHO looked ok  Will ask IM to consult  24. AKI: repeat BMP today.  24. Disposition: Lives with husband and daughter (autistic but high functioning)- husband very involved and has already coordinated with his boss to take time off to care for her. Messaged April to schedule HFU/TC.  35 minutes spent in discussion of hypoxia, CT of chest, neck and shoulder pain, headaches, review of labs and ordering repeat BMP, review of medicine notes, discussion of progress with teams, ordering antifungal for abdominal wounds. >50% of time spent in direct care and coordination of care  LOS: 6 days A FACE TO FACE EVALUATION WAS [place] 07/04/2021, 10:41 AM

## 2021-07-04 NOTE — Progress Notes (Signed)
PROGRESS NOTE        PATIENT DETAILS Name: Erika [name] Age: 50 y.o. Sex: female Date of Birth: 1971-10-08 Admit Date: 06/28/2021 Admitting Physician Izora Ribas, MD WLS:LHTD, Jobe Marker, MD  Brief Narrative: Patient is a 50 y.o. female who was hospitalized from 7/2-7/6-for acute CVA-Hospital course complicated by hypoxia/sepsis due to aspiration event-ultimately discharged to CIR-while at [facility] was noted to have hypoxia-further evaluation revealed recurrent aspiration pneumonia.  TRH consulted for evaluation by CIR MD.  Subjective: Feels much better today-on room air-hungry and wants to eat.  Objective: Vitals: Blood pressure 114/60, pulse (!) 59, temperature 98.2 F (36.8 C), temperature source Oral, resp. rate 16, height 5\' 6"  (1.676 m), weight 57.6 kg, SpO2 98 %.  Physical exam: Gen Exam:Alert awake-not in any distress HEENT:atraumatic, normocephalic Chest: B/L clear to auscultation anteriorly CVS:S1S2 regular Abdomen:soft non tender, non distended Extremities:no edema Neurology: Left-sided hemiplegia Skin: no rash   Labs: CBC Latest Ref Rng & Units 07/03/2021 07/02/2021 07/02/2021  WBC 4.0 - 10.5 K/uL 21.2(H) 10.7(H) 11.2(H)  Hemoglobin 12.0 - 15.0 g/dL 12.0 13.3 12.6  Hematocrit 36.0 - 46.0 % 37.2 40.5 38.4  Platelets 150 - 400 K/uL 380 385 355     BMP Latest Ref Rng & Units 07/03/2021 07/02/2021 07/02/2021  Glucose 70 - 99 mg/dL 141(H) 68(L) 60(L)  BUN 6 - 20 mg/dL 16 13 12   Creatinine 0.44 - 1.00 mg/dL 1.08(H) 0.86 0.84  BUN/Creat Ratio 9 - 23 - - -  Sodium 135 - 145 mmol/L 132(L) 138 139  Potassium 3.5 - 5.1 mmol/L 4.0 4.4 4.0  Chloride 98 - 111 mmol/L 98 102 100  CO2 22 - 32 mmol/L 29 27 31   Calcium 8.9 - 10.3 mg/dL 9.1 9.9 9.9     Radiology: CTA chest: No PE-multifocal nodular/groundglass opacities within both lungs   Assessment/Plan: Acute hypoxic respiratory failure-likely due to recurrent aspiration pneumonia:  Hypoxia has resolved-she is on room air-did have significant leukocytosis yesterday-agree with Unasyn-recommend total 7 days of treatment.  Have asked RN to see if he can have another speech therapy evaluation to make sure she is on the right consistency diet.  Recent CVA-with left-sided hemiplegia/dysarthria: Continue antiplatelet/statin  HLD: Continue statin  HTN: BP stable-continue losartan, Coreg, Imdur, Aldactone, Demadex  History of CAD-s/p CABG:No anginal symptoms-on dual antiplatelet/statin  DM-2 :CBGs seem adequately well controlled-continue 33 units of Lantus/SSI  Defer further to primary service.  DVT Prophylaxis : enoxaparin (LOVENOX) injection 40 mg Start: 06/28/21 2000   Antimicrobial agents: Anti-infectives (From admission, onward)    Start     Dose/Rate Route Frequency Ordered Stop   07/03/21 1930  Ampicillin-Sulbactam (UNASYN) 3 g in sodium chloride 0.9 % 100 mL IVPB        3 g 200 mL/hr over 30 Minutes Intravenous Every 6 hours 07/03/21 1838     07/03/21 0400  vancomycin (VANCOREADY) IVPB 750 mg/150 mL  Status:  Discontinued        750 mg 150 mL/hr over 60 Minutes Intravenous Every 12 hours 07/02/21 1426 07/03/21 0533   07/02/21 1600  ceFEPIme (MAXIPIME) 2 g in sodium chloride 0.9 % 100 mL IVPB  Status:  Discontinued        2 g 200 mL/hr over 30 Minutes Intravenous Every 8 hours 07/02/21 1353 07/03/21 0533   07/02/21 1400  vancomycin (VANCOREADY) IVPB 1250  mg/250 mL        1,250 mg 166.7 mL/hr over 90 Minutes Intravenous  Once 07/02/21 1312 07/02/21 1819   06/28/21 1500  Ampicillin-Sulbactam (UNASYN) 3 g in sodium chloride 0.9 % 100 mL IVPB  Status:  Discontinued        3 g 200 mL/hr over 30 Minutes Intravenous Every 6 hours 06/28/21 1321 07/02/21 1353        Time spent: 25-minutes-Greater than 50% of this time was spent in counseling, explanation of diagnosis, planning of further management, and coordination of care.  MEDICATIONS: Scheduled Meds:   aspirin  81 mg Oral Daily   benztropine  0.5 mg Oral QHS   carvedilol  6.25 mg Oral BID WC   chlorproMAZINE  100 mg Oral QHS   clopidogrel  75 mg Oral Daily   cyanocobalamin  1,000 mcg Subcutaneous Q30 days   dextromethorphan-guaiFENesin  1 tablet Oral BID   enoxaparin (LOVENOX) injection  40 mg Subcutaneous Q24H   ezetimibe  10 mg Oral Daily   FLUoxetine  60 mg Oral Daily   insulin aspart  0-5 Units Subcutaneous QHS   insulin aspart  0-9 Units Subcutaneous TID WC   insulin glargine  33 Units Subcutaneous BID   isosorbide mononitrate  60 mg Oral BID   lamoTRIgine  200 mg Oral QHS   losartan  25 mg Oral Daily   nicotine  21 mg Transdermal Daily   pregabalin  225 mg Oral BID   rosuvastatin  20 mg Oral Daily   spironolactone  25 mg Oral Daily   thiamine  100 mg Oral Daily   torsemide  20 mg Oral Daily   traZODone  50 mg Oral QHS   Vitamin D (Ergocalciferol)  50,000 Units Oral Q7 days   Continuous Infusions:  sodium chloride Stopped (07/03/21 0437)   sodium chloride 75 mL/hr at 07/04/21 0612   ampicillin-sulbactam (UNASYN) IV 3 g (07/04/21 0614)   PRN Meds:.sodium chloride, acetaminophen, albuterol, alum & mag hydroxide-simeth, bisacodyl, clonazePAM, diphenhydrAMINE, guaiFENesin-dextromethorphan, nitroGLYCERIN, oxyCODONE, polyethylene glycol, prochlorperazine **OR** prochlorperazine **OR** prochlorperazine, sodium phosphate, sorbitol, traZODone    I have personally reviewed following labs and imaging studies  LABORATORY DATA: CBC: Recent Labs  Lab 06/29/21 0532 06/30/21 0528 07/01/21 0515 07/02/21 0526 07/02/21 1319 07/03/21 0848  WBC 11.5* 13.3* 13.4* 11.2* 10.7* 21.2*  NEUTROABS 7.5 9.4* 9.1* 6.7 7.2  --   HGB 12.3 12.3 12.7 12.6 13.3 12.0  HCT 37.8 37.8 39.4 38.4 40.5 37.2  MCV 92.0 92.2 92.7 91.9 92.7 93.0  PLT 318 361 373 355 385 798    Basic Metabolic Panel: Recent Labs  Lab 06/28/21 0605 06/29/21 0532 06/30/21 0528 07/01/21 0515 07/02/21 0526  07/02/21 1319 07/03/21 0848  NA 141 139 137  --  139 138 132*  K 3.2* 3.4* 3.8  --  4.0 4.4 4.0  CL 102 101 99  --  100 102 98  CO2 31 31 30   --  31 27 29   GLUCOSE 83 79 78  --  60* 68* 141*  BUN 13 11 9   --  12 13 16   CREATININE 0.57 0.71 0.64  --  0.84 0.86 1.08*  CALCIUM 9.4 9.4 9.5  --  9.9 9.9 9.1  MG 2.0  --   --  2.2  --   --   --     GFR: Estimated Creatinine Clearance: 58.6 mL/min (A) (by C-G formula based on SCr of 1.08 mg/dL (H)).  Liver Function Tests: Recent Labs  Lab 06/29/21 0532  AST 24  ALT 36  ALKPHOS 58  BILITOT 0.6  PROT 5.6*  ALBUMIN 2.9*   No results for input(s): LIPASE, AMYLASE in the last 168 hours. Recent Labs  Lab 07/03/21 1454  AMMONIA 38*    Coagulation Profile: No results for input(s): INR, PROTIME in the last 168 hours.  Cardiac Enzymes: No results for input(s): CKTOTAL, CKMB, CKMBINDEX, TROPONINI in the last 168 hours.  BNP (last 3 results) No results for input(s): PROBNP in the last 8760 hours.  Lipid Profile: No results for input(s): CHOL, HDL, LDLCALC, TRIG, CHOLHDL, LDLDIRECT in the last 72 hours.  Thyroid Function Tests: No results for input(s): TSH, T4TOTAL, FREET4, T3FREE, THYROIDAB in the last 72 hours.  Anemia Panel: No results for input(s): VITAMINB12, FOLATE, FERRITIN, TIBC, IRON, RETICCTPCT in the last 72 hours.  Urine analysis:    Component Value Date/Time   COLORURINE YELLOW 07/03/2021 [place] 07/03/2021 0834   APPEARANCEUR Hazy 01/11/2015 1022   LABSPEC 1.016 07/03/2021 0834   LABSPEC 1.024 01/11/2015 1022   PHURINE 6.0 07/03/2021 0834   GLUCOSEU NEGATIVE 07/03/2021 0834   GLUCOSEU >=1000 (A) 10/19/2019 1034   HGBUR NEGATIVE 07/03/2021 0834   BILIRUBINUR NEGATIVE 07/03/2021 0834   BILIRUBINUR Negative 10/19/2019 1049   BILIRUBINUR Negative 01/11/2015 1022   KETONESUR NEGATIVE 07/03/2021 0834   PROTEINUR NEGATIVE 07/03/2021 0834   UROBILINOGEN 1.0 10/19/2019 1049   UROBILINOGEN 1.0  10/19/2019 1034   NITRITE NEGATIVE 07/03/2021 0834   LEUKOCYTESUR NEGATIVE 07/03/2021 0834   LEUKOCYTESUR Negative 01/11/2015 1022    Sepsis Labs: Lactic Acid, Venous    Component Value Date/Time   LATICACIDVEN 0.7 07/03/2021 1228    MICROBIOLOGY: Recent Results (from the past 240 hour(s))  Culture, blood (routine x 2) Call MD if unable to obtain prior to antibiotics being given     Status: None   Collection Time: 06/25/21  7:40 AM   Specimen: BLOOD  Result Value Ref Range Status   Specimen Description BLOOD BLOOD RIGHT FOREARM  Final   Special Requests   Final    BOTTLES DRAWN AEROBIC AND ANAEROBIC Blood Culture adequate volume   Culture   Final    NO GROWTH 5 DAYS Performed at [facility], [address] [place], [place] [address]    Report Status 06/30/2021 FINAL  Final  Culture, blood (routine x 2) Call MD if unable to obtain prior to antibiotics being given     Status: None   Collection Time: 06/25/21  7:48 AM   Specimen: BLOOD  Result Value Ref Range Status   Specimen Description BLOOD RIGHT WRIST  Final   Special Requests   Final    BOTTLES DRAWN AEROBIC AND ANAEROBIC Blood Culture adequate volume   Culture   Final    NO GROWTH 5 DAYS Performed at [facility], [address], [place], [place] [address]    Report Status 06/30/2021 FINAL  Final  Culture, Urine     Status: None   Collection Time: 06/29/21  5:51 PM   Specimen: Urine, Random  Result Value Ref Range Status   Specimen Description URINE, RANDOM  Final   Special Requests NONE  Final   Culture   Final    NO GROWTH Performed at [facility], [address], [place], [place] [address]    Report Status 07/01/2021 FINAL  Final  Culture, Urine     Status: None   Collection Time: 07/03/21  9:14 AM  Specimen: Urine, Random  Result Value Ref Range Status   Specimen Description URINE, RANDOM  Final   Special Requests NONE  Final   Culture   Final    NO GROWTH Performed at  [facility], [address], [place], [place] [address]    Report Status 07/04/2021 FINAL  Final    RADIOLOGY STUDIES/RESULTS: DG Chest 2 View  Result Date: 07/03/2021 CLINICAL DATA:  Hypoxia. EXAM: CHEST - 2 VIEW COMPARISON:  07/02/2021. FINDINGS: Trachea is midline. Heart is enlarged. Mild right basilar atelectasis. Lungs are otherwise clear. No pleural fluid. Image quality on the lateral view is degraded by motion. IMPRESSION: Mild basilar atelectasis. Electronically Signed   By: Lorin Picket M.D.   On: 07/03/2021 10:10   DG Chest 2 View  Result Date: 07/02/2021 CLINICAL DATA:  Hypoxia.  Smoker. EXAM: CHEST - 2 VIEW COMPARISON:  07/01/2021. FINDINGS: Stable borderline enlarged cardiac silhouette, post CABG changes and mild diffuse peribronchial thickening and accentuation of the interstitial markings. Unremarkable bones. IMPRESSION: No acute abnormality. Electronically Signed   By: Claudie Revering M.D.   On: 07/02/2021 16:24   CT HEAD WO CONTRAST  Result Date: 07/03/2021 CLINICAL DATA:  Neuro deficit with acute stroke suspected. EXAM: CT HEAD WITHOUT CONTRAST TECHNIQUE: Contiguous axial images were obtained from the base of the skull through the vertex without intravenous contrast. COMPARISON:  Brain MRI 06/27/2021 FINDINGS: Brain: Recent right-sided pontine perforator infarct. No hemorrhage or visible progression from brain MRI. Remote right parietal and parietooccipital infarcts. No evidence of acute infarct, hemorrhage, hydrocephalus, or collection. Vascular: No hyperdense vessel or unexpected calcification. Skull: Normal. Negative for fracture or focal lesion. Sinuses/Orbits: Negative IMPRESSION: 1. Stable compared to brain MRI 06/27/2021. 2. Subacute right pontine infarct. 3. Remote right parietal and parietooccipital cortex infarcts. Electronically Signed   By: Monte Fantasia M.D.   On: 07/03/2021 06:18   CT Angio Chest Pulmonary Embolism (PE) W or WO Contrast  Result Date:  07/03/2021 CLINICAL DATA:  Shortness of breath, PE suspected EXAM: CT ANGIOGRAPHY CHEST WITH CONTRAST TECHNIQUE: Multidetector CT imaging of the chest was performed using the standard protocol during bolus administration of intravenous contrast. Multiplanar CT image reconstructions and MIPs were obtained to evaluate the vascular anatomy. CONTRAST:  30mL OMNIPAQUE IOHEXOL 350 MG/ML SOLN COMPARISON:  June 24, 2021 FINDINGS: Cardiovascular: Satisfactory opacification of the pulmonary arteries to the segmental level. No evidence of pulmonary embolism. Similar enlargement of the main pulmonary artery measuring 3.1 cm. Aortic atherosclerosis without aneurysmal dilation. Normal heart size. No significant pericardial effusion/thickening. No suspicious intracardiac filling defect. Coronary artery calcifications. Surgical changes of prior CABG. Mediastinum/Nodes: No discrete thyroid nodule. No pathologically enlarged mediastinal, hilar or axillary lymph nodes. Patulous fluid-filled esophagus, similar prior. Trachea is unremarkable. Lungs/Pleura: Multifocal nodular opacities for instance in the left upper lobe on image 63/5 left lower lobe on image 67/5 and right lower lobe on image 72/5 and 100/5. Bronchial wall thickening. Scattered atelectasis. No pleural effusion or pneumothorax. Upper Abdomen: No acute abnormality. Musculoskeletal: No chest wall abnormality. No acute or significant osseous findings. Review of the MIP images confirms the above findings. IMPRESSION: 1. No evidence of pulmonary embolism. 2. Multifocal nodular and ground-glass opacities within both lungs, likely infectious or inflammatory. Which given the fluid-filled patulous appearance of the soft Gus may be related to aspiration. 3. Similar enlargement of the main pulmonary artery, which can be seen with pulmonary arterial hypertension. 4. Aortic Atherosclerosis (ICD10-I70.0). Electronically Signed   By: [name] Bailiff MD  On: 07/03/2021 18:21     LOS: 6  days   Oren Binet, MD  [facility]    To contact the attending provider between 7A-7P or the covering provider during after hours 7P-7A, please log into the web site [email or web address] and access using universal  password for that web site. If you do not have the password, please call the hospital operator.  07/04/2021, 9:54 AM

## 2021-07-04 NOTE — Patient Care Conference (Signed)
Inpatient RehabilitationTeam Conference and Plan of Care Update Date: 07/04/2021   Time: 13:11 PM    Patient Name: Erika [name]      Medical Record Number: [id number]  Date of Birth: 1971/06/07 Sex: Female         Room/Bed: 4W19C/4W19C-01 Payor Info: Payor: MEDICARE / Plan: MEDICARE PART A / Product Type: *No Product type* /    Admit Date/Time:  06/28/2021  1:00 PM  Primary Diagnosis:  Right pontine stroke [facility])  Hospital Problems: Principal Problem:   Right pontine stroke [facility])    Expected Discharge Date: Expected Discharge Date: 07/25/21  Team Members Present: Physician leading conference: Dr. Leeroy Cha Care Coodinator Present: Dorien Chihuahua, RN, BSN, CRRN;Becky Dupree, LCSW Nurse Present: Dorien Chihuahua, RN PT Present: Callie Fielding, PT OT Present: Meriel Pica, OT SLP Present: Charolett Bumpers, SLP     Current Status/Progress Goal Weekly Team Focus  Bowel/Bladder   incontinent of B/B LBM         Swallow/Nutrition/ Hydration   dys 3 textures and thin liquids, full supervision A and planned MBS 7/13  Mod I  carryover of small sips/bites and regular textures trials   ADL's             Mobility   mod assist  supervision to min assist  balance, transfers, gait, LLE NMR   Communication   Max A sentence level due to fast rate  Mod I  speech intelligibility strategies and awareness   Safety/Cognition/ Behavioral Observations  mod A poor attention/implusive/needs to slow rate  Supervision A - Mod I  mildly complex problem solving, error awareness, sustained attention and recall   Pain             Skin               Discharge Planning:  HOme with husband and family members to assist with her care. Husband can work from home for a short time   Team Discussion: Hypoxic spell over the weekend and somnolence; slow recovery. CT noted ? Pna, consult MD; recommend NPO until SLP re-eval due to suspect recurrent asp. Pna; Unasyn added. Reinforce up in chair for  meals with COPD history. T2DM but not eating well; hypoglycemia; MD adjust medications. Norm HTN due to stenosis; stable for now; MD adjusting medications. Antifungal topical for abd. fold skin irritation; UA negative. Also note shoulder pain; sling recommended and arm trough support for wheelchair added. Patient on target to meet rehab goals: yes, currently mod assist for self care; using a stedy for transfers. Mod assist overall with PT and requires max assist for gait.  *See Care Plan and progress notes for long and short-term goals.   Revisions to Treatment Plan:  Plan MBS 07/05/21; maintain on D3; thin diet with full supervision for now Working on cues for rate of speech, intelligibility, error awareness, sustained attention and recall and medication management   Teaching Needs: Safety, medications, skin care, secondary stroke risk management, transfers, etc.  Current Barriers to Discharge: Decreased caregiver support and Medical stability  Possible Resolutions to Barriers: Family education     Medical Summary Current Status: hypoxia to 72, aspiration pneumonia, type 2 DM, severe intracranial stenosis, left sided hemiplegia, cough, active smoker, left sided shoulder pain  Barriers to Discharge: Medical stability  Barriers to Discharge Comments: hypoxia to 72, aspiration pneumonia, type 2 DM, severe intracranial stenosis, left sided hemiplegia, cough, active smoker, left sided shoulder pain Possible Resolutions to [employer] Focus: continue oxygen PRN, continue  aspiration precautions, decrease Lantus, decrease anti-hypertensives, continue smoking cessation counseling, continue lidocaine patch   Continued Need for Acute Rehabilitation Level of Care: The patient requires daily medical management by a physician with specialized training in physical medicine and rehabilitation for the following reasons: Direction of a multidisciplinary physical rehabilitation program to maximize  functional independence : Yes Medical management of patient stability for increased activity during participation in an intensive rehabilitation regime.: Yes Analysis of laboratory values and/or radiology reports with any subsequent need for medication adjustment and/or medical intervention. : Yes   I attest that I was present, lead the team conference, and concur with the assessment and plan of the team.   Dorien Chihuahua B 07/04/2021, 3:14 PM

## 2021-07-04 NOTE — Progress Notes (Addendum)
Physical Therapy Session Note  Patient Details  Name: Erika [name] MRN: [id number] Date of Birth: 20-Oct-1971  Today's Date: 07/04/2021 PT Individual Time: 1300-1359 PT Individual Time Calculation (min): 59 min   Short Term Goals: Week 1:  PT Short Term Goal 1 (Week 1): Pt will perform bed mobility with minA. PT Short Term Goal 2 (Week 1): Pt will perform sit to stand transfer with minA. PT Short Term Goal 3 (Week 1): Pt perform bed to chair transfer with minA. PT Short Term Goal 4 (Week 1): Pt will ambulate x25' with modA.   Skilled Therapeutic Interventions/Progress Updates:  Patient seated upright in w/c on entrance to room. Patient alert and agreeable to PT session. Patient denied pain during session, although states that she is hungry as she was made NPO as of previous evening and has had nothing to eat since. Is also feeling a little weaker than usual. Questions NPO order and chart review done with pt in order to orient pt to timeline of events from previous day (as she does not remember being taken from room in her bed). Related to pt that hospitalist visit revealed concern for aspiration risk and increased chances of aspiration PNA again. MD wrote orders for her to be NPO until she could be re-evaluated by SLP. Reminded pt that SLP is scheduled to arrive at end of this PT session. As pt is diabetic, and has been participating in therapies all morning, decision made to keep standing therapy to a minimum this session for safety.   Therapeutic Activity: Transfers: Patient performed STS with Min A and SPVT transfers throughout session with Mod A and block to L knee requiring vc/ tc for hand placement/ foot placement, weight shift, and technique.   Neuromuscular Re-ed: NMR facilitated during session with focus on LLE  and trunk muscle activation. Pt guided in continuous reciprocation of RUE and BLE using NuStep L2 x 21min and L3 x 36min with focus on LLE activation in extensor push. Pt  provided with Max A for maintaining neutral position of LLE and uses RUE for active assist with improving quad and glute activation and LLE able to initiate and complete push around 7th minute. Increase in resistance at minute 9 with pt able to continue to demo good extensor push with LLE.   Pt guided in sitting balance and improving trunk control and LUE mobility with R hand over hemi hand on theraball while seated in front of mat table. Guided pt in rolling ball fwd/ bkwd. Requires visual target of orange cone to aim hands toward in order to improve R sided lean toward midline. Pt also guided in bean bag toss toward cone target on mat table in order to challenge dynamic sitting balance. VC/ tc to increase fwd lean for improved force of underhand toss as pt wants to throw overhand. NMR performed for improvements in motor control and coordination, balance, sequencing, judgement, and self confidence/ efficacy in performing all aspects of mobility at highest level of independence.   Patient seated  in w/c at end of session with brakes locked, belt alarm set, and all needs within reach. SLP arriving to take over therapy session.      Therapy Documentation Precautions:  Precautions Precautions: Fall Precaution Comments: L hemiplegia Restrictions Weight Bearing Restrictions: No LUE Weight Bearing: Weight bearing as tolerated LLE Weight Bearing: Weight bearing as tolerated  Therapy/Group: Individual Therapy  [name] PT, DPT 07/04/2021, 5:10 PM

## 2021-07-04 NOTE — Progress Notes (Signed)
Patient ID: Erika [name], female   DOB: [birth year], 50 y.o.   MRN: [id number]  Met with pt and husband to update team conference goals of CGA-min level and target discharge date of 8/2. Both were fine with and feel she will need this time to make improvements. Discussed their landlord has decided to sell their home they rent so need to find a place to live now. Husband has a brother that is a Engineer, site who is looking into this. Continue to work on discharge needs.

## 2021-07-04 NOTE — Progress Notes (Signed)
Occupational Therapy Session Note  Patient Details  Name: Erika [name] MRN: [id number] Date of Birth: 1971-04-29  Today's Date: 07/04/2021 OT Individual Time: 1030-1130 OT Individual Time Calculation (min): 60 min    Short Term Goals: Week 1:  OT Short Term Goal 1 (Week 1): Pt will be able to complete toilet transfer squat pivot with min A. OT Short Term Goal 2 (Week 1): Pt will be able to stand with min A and pull pants over hips with min A. OT Short Term Goal 3 (Week 1): Pt will be able to don shirt with min A. OT Short Term Goal 4 (Week 1): Pt will be able to self mobilize LUE.  Skilled Therapeutic Interventions/Progress Updates:      Pt seen for BADL retraining of toileting, bathing, and dressing with a focus on LUE management, hemi techniques and postural control in sit and standing.  Pt received in bed with spouse in the room, reviewed and demonstrated with him safe PROM to R scapula, shoulder. He stated he understood and has been careful to not lift arm above shoulder height.    Pt taught LUE management with rolling to L in bed and how to positon herself for sleeping as she likes to sleep on L.   From sidelying pushed up to sit with min -mod A.   Pt needed to toilet and wanted to shower so use stedy today as she was very weak yesterday.    Pt did extremely well with stedy, standing with close S to CGA then able to maintain stand in stedy.  Initiated training with spouse to start using stedy.  He practiced but would benefit from more practice before he is cleared to assist her.    Pt toileted at toilet, showered on bench, dressed from wc all with mod A overall. Sit to stand 1 x in shower and 2 x next to bed using R hand on rail with only min A.  Able to release hand to partially manage clothing with R hand with standing with min -mod A.  Pt resting in wc at end of session with spouse in the room. Belt alarm on and all needs met.    Therapy Documentation Precautions:  fall   Vital Signs: Therapy Vitals Temp: 98.5 F (36.9 C) Temp Source: Oral Pulse Rate: (!) 58 Resp: 18 BP: 133/67 Patient Position (if appropriate): Lying Oxygen Therapy SpO2: 93 % O2 Device: Room Air Pain: Pain Assessment Pain Scale: 0-10 Pain Score: 5  Pain Type: Chronic pain Pain Location: Head Pain Descriptors / Indicators: Headache Pain Frequency: Intermittent Pain Onset: On-going Patients Stated Pain Goal: 0 Pain Intervention(s): Medication (See eMAR) ADL: ADL Eating: Set up Grooming: Setup Upper Body Bathing: Minimal assistance Lower Body Bathing: Moderate assistance Upper Body Dressing: Moderate assistance Lower Body Dressing: Maximal assistance Toileting: Maximal assistance Where Assessed-Toileting: Bedside Commode Toilet Transfer: Moderate assistance Toilet Transfer Method: Squat pivot Toilet Transfer Equipment: Drop arm bedside commode   Therapy/Group: Individual Therapy  Roben Tatsch 07/04/2021, 12:47 PM

## 2021-07-04 NOTE — Progress Notes (Signed)
Physical Therapy Session Note  Patient Details  Name: Erika [name] MRN: [id number] Date of Birth: May 03, 1971  Today's Date: 07/04/2021 PT Individual Time: 0900-1000 PT Individual Time Calculation (min): 60 min   Short Term Goals: Week 1:  PT Short Term Goal 1 (Week 1): Pt will perform bed mobility with minA. PT Short Term Goal 2 (Week 1): Pt will perform sit to stand transfer with minA. PT Short Term Goal 3 (Week 1): Pt perform bed to chair transfer with minA. PT Short Term Goal 4 (Week 1): Pt will ambulate x25' with modA. Week 2:     Skilled Therapeutic Interventions/Progress Updates:    Pain:  Pt reports 2/10 chest pain at end of session.  See below for care.  Treatment to tolerance.  Rest breaks and repositioning as needed. Clean brief and pants donned w/mod assist, pt assists w/raising RLE, therapist moves LLE w/cues for pt to focus on limb. Bridgning w/foocus on maintaining stable LLE hip/neutral w/bridging during dressing cues to have pt "look at" her limb while focusing on stability. Rolls w/cues for sequencing, assist to position LLE, side to sit w/mod assist from L Sit to stand w/mod assist and worked on midline orientation, upright midline posture, wbing thru LLE via reaching, moderate assist, moderate multimodal cueing. stand pivot transfer to wc w/max cues for sequencing, mod assist.  Once in wc, pt transported to gym for continued session. Husband assisted during session and proves to be helpful. Pt instructed w/use of Litegait.  Harness donned w/total assist in sitting/standing at litegait w/min assist for balance.  DF acewrap assit to LLE, L hand secured w/acewrap to LiteGait handrail.  Gait 13ft w/mod to max assist for wt shifting, advancement of LLE, pt able to advance approx 25% time w/min assist, 75% w/mod assist.  Extension thrust L knee w/loading to midsance, mod cues to advance L hip at midstance.  Husband assisted w/manageing IV pole and Litegait/steering.  Pt  c/o PVD pain in [place] which limits gait distance.  Stand to sit w/mod assist.   Therapist obtained armtrough for LUE support while seated in wc.   Pt c/o "chest pain 2/10 and nursing notified, immediately assesses pt.  but further questioning by nursing pt clarified pain was in neck and not in chest.   Squat pivot wc to bed max assist.  Sit to supine mod assist.  Pt handed off to nurisng for BS and vitals assessment due to questionable c/os in session.   Therapy Documentation Precautions:  Precautions Precautions: Fall Precaution Comments: L hemiplegia Restrictions Weight Bearing Restrictions: No LUE Weight Bearing: Weight bearing as tolerated LLE Weight Bearing: Weight bearing as tolerated     Therapy/Group: Individual Therapy Callie Fielding, [place] 07/04/2021, 4:11 PM

## 2021-07-04 NOTE — Progress Notes (Signed)
Speech Language Pathology Daily Session Note  Patient Details  Name: Erika [name] MRN: [id number] Date of Birth: 04-17-1971  Today's Date: 07/04/2021 SLP Individual Time: 9480-1655 SLP Individual Time Calculation (min): 45 min  Short Term Goals: Week 1: SLP Short Term Goal 1 (Week 1): Patient will tolerate Dys 3 solids, thin liquids without overt s/s aspiration or penetration and with consistent following of swallow precautions (small sips via straw or cup). SLP Short Term Goal 2 (Week 1): Patient will tolerate trials of regular solids textures with minimal cues for clearing oral cavity and checking for left sided oral pocketing. SLP Short Term Goal 3 (Week 1): Patient will utilize speech strategies (slow down rate, overarticulate) in structured conversation with min-modA cues. SLP Short Term Goal 4 (Week 1): Patient will perfrom mildly complex problem solving tasks in mildly distracting environments with modA cues to redirect. SLP Short Term Goal 5 (Week 1): Patient will demonstrate daily recall of specific medical and therapeutic interventions (exercises, strategies, etc.) with minA cues.  Skilled Therapeutic Interventions: Skilled treatment session focused on dysphagia goals. Patient was made NPO last night due to suspected aspiration episode. SLP facilitated session by providing trials of thin liquids. Patient consumed single sips of thin liquids via cup and straw while upright in the wheelchair without overt s/s of aspiration. Patient also consumed trials of Dys. 2 and Dys. 3 textures resulting in prolonged mastication and mild oral stasis with left buccal pocketing that cleared with a lingual sweep and/or liquid wash. Patient with one coughing episode, suspect due to mixed consistencies. Recommend initiation of a Dys. 2 diet with thin liquids with medications whole in puree. Also recommend strict aspiration precautions of full supervision, out of bed for meals, single sips, monitoring  and clearing left buccal pocketing, minimal distractions as well as limited mixed consistencies. Will do an MBS tomorrow to assess swallow function and safety. Educated patient, her husband, the NT, PA and RN of results. Patient left upright in the wheelchair with alarm on and all needs within reach. Continue with current plan of care.      Pain No/Denies Pain   Therapy/Group: Individual Therapy  Nikko Goldwire, [place] 07/04/2021, 3:14 PM

## 2021-07-05 ENCOUNTER — Inpatient Hospital Stay (HOSPITAL_COMMUNITY): Payer: Managed Care, Other (non HMO)

## 2021-07-05 LAB — COMPREHENSIVE METABOLIC PANEL
ALT: 24 U/L (ref 0–44)
AST: 19 U/L (ref 15–41)
Albumin: 2.8 g/dL — ABNORMAL LOW (ref 3.5–5.0)
Alkaline Phosphatase: 57 U/L (ref 38–126)
Anion gap: 8 (ref 5–15)
BUN: 11 mg/dL (ref 6–20)
CO2: 27 mmol/L (ref 22–32)
Calcium: 9.3 mg/dL (ref 8.9–10.3)
Chloride: 101 mmol/L (ref 98–111)
Creatinine, Ser: 0.81 mg/dL (ref 0.44–1.00)
GFR, Estimated: >60 mL/min (ref >60)
Glucose, Bld: 124 mg/dL — ABNORMAL HIGH (ref 70–99)
Potassium: 3.7 mmol/L (ref 3.5–5.1)
Sodium: 136 mmol/L (ref 135–145)
Total Bilirubin: 0.4 mg/dL (ref 0.3–1.2)
Total Protein: 6.1 g/dL — ABNORMAL LOW (ref 6.5–8.1)

## 2021-07-05 LAB — CBC
HCT: 36.8 % (ref 36.0–46.0)
Hemoglobin: 12.2 g/dL (ref 12.0–15.0)
MCH: 30.5 pg (ref 26.0–34.0)
MCHC: 33.2 g/dL (ref 30.0–36.0)
MCV: 92 fL (ref 80.0–100.0)
Platelets: 354 10*3/uL (ref 150–400)
RBC: 4 MIL/uL (ref 3.87–5.11)
RDW: 14.1 % (ref 11.5–15.5)
WBC: 12.5 10*3/uL — ABNORMAL HIGH (ref 4.0–10.5)
nRBC: 0 % (ref 0.0–0.2)

## 2021-07-05 LAB — GLUCOSE, CAPILLARY
Glucose-Capillary: 85 mg/dL (ref 70–99)
Glucose-Capillary: 119 mg/dL — ABNORMAL HIGH (ref 70–99)
Glucose-Capillary: 158 mg/dL — ABNORMAL HIGH (ref 70–99)
Glucose-Capillary: 143 mg/dL — ABNORMAL HIGH (ref 70–99)

## 2021-07-05 LAB — MAGNESIUM: Magnesium: 1.9 mg/dL (ref 1.7–2.4)

## 2021-07-05 LAB — PHOSPHORUS: Phosphorus: 3.4 mg/dL (ref 2.5–4.6)

## 2021-07-05 MED ORDER — TORSEMIDE 20 MG PO TABS
10.0000 mg | ORAL_TABLET | Freq: Every day | ORAL | Status: DC
  Administered 2021-07-06 – 2021-08-01 (×27): 10 mg via ORAL
  Filled 2021-07-05 (×27): qty 1

## 2021-07-05 MED ORDER — GUAIFENESIN ER 600 MG PO TB12
600.0000 mg | ORAL_TABLET | Freq: Two times a day (BID) | ORAL | Status: DC
  Administered 2021-07-05 – 2021-08-01 (×53): 600 mg via ORAL
  Filled 2021-07-05 (×55): qty 1

## 2021-07-05 NOTE — Progress Notes (Signed)
Occupational Therapy Session Note  Patient Details  Name: Erika [name] MRN: [id number] Date of Birth: 09-22-1971  Today's Date: 07/05/2021 OT Individual Time: 1447-1530 OT Individual Time Calculation (min): 43 min    Short Term Goals: Week 1:  OT Short Term Goal 1 (Week 1): Pt will be able to complete toilet transfer squat pivot with min A. OT Short Term Goal 2 (Week 1): Pt will be able to stand with min A and pull pants over hips with min A. OT Short Term Goal 3 (Week 1): Pt will be able to don shirt with min A. OT Short Term Goal 4 (Week 1): Pt will be able to self mobilize LUE.  Skilled Therapeutic Interventions/Progress Updates:    Pt received in room in w/c and consented to OT tx. Pt instructed to propel w/c with RUE and LUE from room to gym and was able to do so with SUP and increased time and rest breaks. Pt focused on LUE ROM utilizing towel slides for anterior lean to engage core muscles while flexing shoulder on table with RUE assist. Pt instructed to do the same thing for windshield wiping motions for horizontal ab/adduction. Pt completed 2x10 of each motion. Pt then trained in standing tolerance activity with Stedy, req min S to power up but able to remain standing with CGA. Therapist facilitated gross grasp for LUE with HOH assist and blocked L elbow for pt to weight bear through LUE while standing.Pt completed standing 3x for about 2 minutes each. After tx, pt completed SPT from w/c to bed with mod A, positioned comfortably in bed with husband present and all needs met. RN notified of beeping IV.   Therapy Documentation Precautions:  Precautions Precautions: Fall Precaution Comments: L hemiplegia Restrictions Weight Bearing Restrictions: No LUE Weight Bearing: Weight bearing as tolerated LLE Weight Bearing: Weight bearing as tolerated  Pain: none     Therapy/Group: Individual Therapy  Rosebud Koenen 07/05/2021, 12:46 PM

## 2021-07-05 NOTE — Progress Notes (Signed)
Occupational Therapy Session Note  Patient Details  Name: Erika [name] MRN: [id number] Date of Birth: 08-Jul-1971  Today's Date: 07/05/2021 OT Individual Time: 1000-1100 OT Individual Time Calculation (min): 60 min    Short Term Goals: Week 1:  OT Short Term Goal 1 (Week 1): Pt will be able to complete toilet transfer squat pivot with min A. OT Short Term Goal 2 (Week 1): Pt will be able to stand with min A and pull pants over hips with min A. OT Short Term Goal 3 (Week 1): Pt will be able to don shirt with min A. OT Short Term Goal 4 (Week 1): Pt will be able to self mobilize LUE.  Skilled Therapeutic Interventions/Progress Updates:    Pt received in bed with spouse in the room. Pt requesting to toilet.  RN arrived to disconnect her from the IV.    Pt seen for BADL retraining of toileting, bathing, and dressing with a focus on L side awareness, postural control, LUE management with hemidressing techniques and functional mobility.  Pt did very well today following cues and was able to roll L with CGA, sidelying to sit L to R with min, squat pivot bed to wc to R with min A (in 3 small scoots) and then squat pivot to toilet with use of bar on her R with min A.  From there she did a great job with sit to stands of min and holding balance with min to mod as she used her R hand for clothing management and self cleansing post toileting.    Only min with UB dressing today using hemidressing techniques and min to  mod with LB pants.   Brought in a powder board to demonstrate to spouse how he can work on PROM and a/arom with her LUE.  Placed board on pt's lap with pillow under board for some arm slides forward and laterally. Also demonstrated how board can go on top of bedside table placed to her L and work on movements in that position. TRACE movement seen in scapular retraction today.  LUE NMR and estim with saebo stim one Facilitated L wrist and finger extensors for sensory input,  initiation of grasp response for 12 minutes combined with functional grasp and release task for FES. Pt reported her arm muscles felt very fatigued after 12 minutes.  Then saebo placed on L deltoids for cyclic stim unattended for pain reduction and facilitation of deltoids for subluxation.  Instructed pt that unit will turn off after 60 minutes but she can manually turn it off if her shoulder gets fatigued. The next therapist will remove it during his session.  Saebo Stim One 330 pulse width 35 Hz pulse rate On 8 sec/ off 8 sec Ramp up/ down 2 sec Symmetrical Biphasic wave form  Max intensity 126m at 500 Ohm load   Pt resting in wc with all needs met and spouse in room with her.   Therapy Documentation Precautions:  Precautions Precautions: Fall Precaution Comments: L hemiplegia Restrictions Weight Bearing Restrictions: No LUE Weight Bearing: Weight bearing as tolerated LLE Weight Bearing: Weight bearing as tolerated    Vital Signs: Therapy Vitals Temp: 97.9 F (36.6 C) Temp Source: Oral Pulse Rate: (!) 54 Resp: 14 BP: 110/64 Patient Position (if appropriate): Lying Oxygen Therapy SpO2: 95 % O2 Device: Room Air Pain:   ADL: ADL Eating: Set up Grooming: Setup Upper Body Bathing: Minimal assistance Lower Body Bathing: Moderate assistance Upper Body Dressing: Moderate assistance Lower Body Dressing:  Maximal assistance Toileting: Maximal assistance Where Assessed-Toileting: Bedside Commode Toilet Transfer: Moderate assistance Toilet Transfer Method: Squat pivot Toilet Transfer Equipment: Drop arm bedside commode   Therapy/Group: Individual Therapy  [place] 07/05/2021, 7:46 AM

## 2021-07-05 NOTE — Progress Notes (Signed)
PROGRESS NOTE   Subjective/Complaints:  Appreciate hospitalist note  No breathing issues , good day in therapy yesterday  ROS: +left sided weakness, +depression, +anxiety, +cough, denies any more shortness of breath   Objective:   DG Chest 2 View  Result Date: 07/03/2021 CLINICAL DATA:  Hypoxia. EXAM: CHEST - 2 VIEW COMPARISON:  07/02/2021. FINDINGS: Trachea is midline. Heart is enlarged. Mild right basilar atelectasis. Lungs are otherwise clear. No pleural fluid. Image quality on the lateral view is degraded by motion. IMPRESSION: Mild basilar atelectasis. Electronically Signed   By: Lorin Picket M.D.   On: 07/03/2021 10:10   CT Angio Chest Pulmonary Embolism (PE) W or WO Contrast  Result Date: 07/03/2021 CLINICAL DATA:  Shortness of breath, PE suspected EXAM: CT ANGIOGRAPHY CHEST WITH CONTRAST TECHNIQUE: Multidetector CT imaging of the chest was performed using the standard protocol during bolus administration of intravenous contrast. Multiplanar CT image reconstructions and MIPs were obtained to evaluate the vascular anatomy. CONTRAST:  13mL OMNIPAQUE IOHEXOL 350 MG/ML SOLN COMPARISON:  June 24, 2021 FINDINGS: Cardiovascular: Satisfactory opacification of the pulmonary arteries to the segmental level. No evidence of pulmonary embolism. Similar enlargement of the main pulmonary artery measuring 3.1 cm. Aortic atherosclerosis without aneurysmal dilation. Normal heart size. No significant pericardial effusion/thickening. No suspicious intracardiac filling defect. Coronary artery calcifications. Surgical changes of prior CABG. Mediastinum/Nodes: No discrete thyroid nodule. No pathologically enlarged mediastinal, hilar or axillary lymph nodes. Patulous fluid-filled esophagus, similar prior. Trachea is unremarkable. Lungs/Pleura: Multifocal nodular opacities for instance in the left upper lobe on image 63/5 left lower lobe on image 67/5 and  right lower lobe on image 72/5 and 100/5. Bronchial wall thickening. Scattered atelectasis. No pleural effusion or pneumothorax. Upper Abdomen: No acute abnormality. Musculoskeletal: No chest wall abnormality. No acute or significant osseous findings. Review of the MIP images confirms the above findings. IMPRESSION: 1. No evidence of pulmonary embolism. 2. Multifocal nodular and ground-glass opacities within both lungs, likely infectious or inflammatory. Which given the fluid-filled patulous appearance of the soft Gus may be related to aspiration. 3. Similar enlargement of the main pulmonary artery, which can be seen with pulmonary arterial hypertension. 4. Aortic Atherosclerosis (ICD10-I70.0). Electronically Signed   By: [name] Bailiff MD   On: 07/03/2021 18:21   Recent Labs    07/04/21 1014 07/05/21 0607  WBC 13.3* 12.5*  HGB 12.2 12.2  HCT 37.8 36.8  PLT 376 354    Recent Labs    07/03/21 0848 07/04/21 1014  NA 132* 138  K 4.0 4.1  CL 98 105  CO2 29 27  GLUCOSE 141* 87  BUN 16 10  CREATININE 1.08* 0.88  CALCIUM 9.1 9.5     Intake/Output Summary (Last 24 hours) at 07/05/2021 0949 Last data filed at 07/05/2021 0754 Gross per 24 hour  Intake 236 ml  Output 650 ml  Net -414 ml         Physical Exam: Vital Signs Blood pressure (!) 109/57, pulse (!) 57, temperature 97.9 F (36.6 C), temperature source Oral, resp. rate 14, height 5\' 6"  (1.676 m), weight 57.6 kg, SpO2 93 %.  General: No acute distress Mood and affect are appropriate Heart: Regular  rate and rhythm no rubs murmurs or extra sounds Lungs: Clear to auscultation, breathing unlabored, no rales or wheezes Abdomen: Positive bowel sounds, soft nontender to palpation, nondistended Extremities: No clubbing, cyanosis, or edema Skin: No evidence of breakdown, no evidence of rash  Psych: pleasant, flat affect Skin: intact Neurological:    Mental Status: She is alert and oriented to person, place, and time.     Comments: Left facial weakness with mild dysarthria. Able to answer orientation questions and follow commands without difficulty.  0/5 LUE and LLE 4/5 on Right side    Assessment/Plan: 1. Functional deficits which require 3+ hours per day of interdisciplinary therapy in a comprehensive inpatient rehab setting. Physiatrist is providing close team supervision and 24 hour management of active medical problems listed below. Physiatrist and rehab team continue to assess barriers to discharge/monitor patient progress toward functional and medical goals  Care Tool:  Bathing    Body parts bathed by patient: Chest, Abdomen, Front perineal area, Right upper leg, Left upper leg, Face, Left arm   Body parts bathed by helper: Right arm, Buttocks, Right lower leg, Left lower leg     Bathing assist Assist Level: Moderate Assistance - Patient 50 - 74%     Upper Body Dressing/Undressing Upper body dressing   What is the patient wearing?: Pull over shirt    Upper body assist Assist Level: Moderate Assistance - Patient 50 - 74%    Lower Body Dressing/Undressing Lower body dressing      What is the patient wearing?: Pants     Lower body assist Assist for lower body dressing: Maximal Assistance - Patient 25 - 49%     Toileting Toileting    Toileting assist Assist for toileting: Minimal Assistance - Patient > 75%     Transfers Chair/bed transfer  Transfers assist     Chair/bed transfer assist level: Moderate Assistance - Patient 50 - 74%     Locomotion Ambulation   Ambulation assist      Assist level: 2 helpers Assistive device: Hand held assist Max distance: 20'   Walk 10 feet activity   Assist     Assist level: 2 helpers Assistive device: Hand held assist   Walk 50 feet activity   Assist Walk 50 feet with 2 turns activity did not occur: Safety/medical concerns  Assist level: Dependent - Patient 0% Assistive device: Lite Gait    Walk 150 feet  activity   Assist Walk 150 feet activity did not occur: Safety/medical concerns         Walk 10 feet on uneven surface  activity   Assist Walk 10 feet on uneven surfaces activity did not occur: Safety/medical concerns         Wheelchair     Assist Will patient use wheelchair at discharge?: No Type of Wheelchair: Manual    Wheelchair assist level: Minimal Assistance - Patient > 75% Max wheelchair distance: 131ft    Wheelchair 50 feet with 2 turns activity    Assist        Assist Level: Minimal Assistance - Patient > 75%   Wheelchair 150 feet activity     Assist          Blood pressure (!) 109/57, pulse (!) 57, temperature 97.9 F (36.6 C), temperature source Oral, resp. rate 14, height 5\' 6"  (1.676 m), weight 57.6 kg, SpO2 93 %.    Medical Problem List and Plan: 1.  Right pontine stroke             -  patient may shower             -ELOS: 8/2             -husband may come in before 8am  Continue CIR PT, OT   2.  Impaired mobility: Continue Lovenox             -antiplatelet therapy: Continue ASA/Plavix 3. Cluster headaches: abortive oxygen PRN. Oxycodone prn for HA 4. Mood: Team to provide ego support. LCSW to follow for evaluation and support.             -antipsychotic agents: Lamictal daily. 5. Neuropsych: This patient is capable of making decisions on her own behalf. 6. Skin/Wound Care: Routine pressure relief measures. 7. Fluids/Electrolytes/Nutrition: Monitor I/O. Check lytes in am. 8. HTN: SBP goal 140-180 range to avoid hypoperfusion of brain. Per husband "acts drunk when BP is low" --Continue Imdur, Coreg, aldactone, Demadex, Per reports 7/10: hypotensive: decrease coreg Vitals:   07/05/21 0534 07/05/21 0814  BP: 110/64 (!) 109/57  Pulse: (!) 54 (!) 57  Resp: 14   Temp: 97.9 F (36.6 C)   SpO2: 95% 93%  Avoid hypoperfusion, TRH following BP management , neg fluid balance reduce diuretic -discussed with TRH 9. T2DM: Hgb A1C-  10.7. BS run 200's at home. Dietary education/reinforcement.             --Continue Lantus  45 units BID with SSI for elevated BS             --7/10: hypoglycemic to 58l, decrease Lantus to 35U BID CBG (last 3)  Recent Labs    07/04/21 1649 07/04/21 2130 07/05/21 0637  GLUCAP 145* 204* 85   CBG a little low will reduce lantus to 33U   10. CAD s/p CABG: On ASA, coreg, Cozaar, Demadex, ASA, 11. PAD w/claudication: Has been smoking 1 PPD --quit at admission. Continue Nicotine patch. --On lyrica BID to manage symptoms.   12. Aspiration PNA: CT reviewed, appreciate medicine consult, WBC up, perhaps new aspiration pneumonia and started on Unasyn IV 7/11.  13. Chronic cluster headaches, currently well controlled: Managed with thorazine as well as oxycodone and high flow oxygen prn. Decreased oxycodone to TID PRN and edited order to indicate it should be given after therapy as per husband's request- makes her too sedated to tolerate therapy.  14. H/o depression/anxiety: Continue Lamictal, Prozac with Klonopin prn. 15. Chronic insomnia: Managed with home dose trazodone. 16. Dyslipidemia: Trig- 636, direct LDL-99.6, HDL 29, Chol 202. On Zetia--to start Vespc 17. Vitamin B 12 deficiency: B12- 169 (was WNL a year ago) 18. NASH/Fatty liver: LFTs reviewed and normalized 19. Dense left sided hemiplegia: [place] regarding NMES- started trials Friday. They have TENS unit at home.  20. Hypokalemia: supplement 31meq 7/7 and repeat BMP reviewed, K+ improved to 3.8. Resolved. D/c daily supplement.  21. Leukocytosis: trending upward potentially secondary to aspiration pneumonia, repeat today 22. Vitamin D deficiency: start ergocalciferol 50,000U weekly for 7 weeks.  23.  Hypoxia , was not on O2 at admission, has been treated for asp PNA with IV abx for several days and WBC are normalizing.  ? Fluid overload from IV abx, crackles at bases but recent ECHO looked ok  Will ask IM to consult  24. AKI:  repeat BMP today.  24. Disposition: Lives with husband and daughter (autistic but high functioning)- husband very involved and has already coordinated with his boss to take time off to care for her. Messaged April to schedule HFU/TC.  LOS: 7 days A FACE TO FACE EVALUATION WAS PERFORMED  Charlett Blake 07/05/2021, 9:49 AM

## 2021-07-05 NOTE — Progress Notes (Signed)
PROGRESS NOTE    Erika [name]  SHF:[id number] DOB: [birth year] DOA: 06/28/2021 PCP: Lesleigh Noe, MD   Brief Narrative:  Patient is a 50 year old Caucasian female who was hospitalized from 06/24/21 until 06/28/2021 for acute CVA.  Her hospital course was complicated by hypoxia and sepsis due to aspiration event she is ultimately discharged to CIR.  While she was at [facility] noted to have further hypoxia and evaluation revealed recurrent aspiration pneumonia.  TRH was consulted by evaluation by the CIR MD. she has been placed on IV Unasyn and her leukocytosis is improving as well as her respiratory status.  She is no longer hypoxic and will need an ambulatory home O2 screen prior to discharge.  We will repeat chest x-ray today still pending to be done.  She will also have a repeat swallow study done and she has now been placed on a dysphagia 2 diet.  Currently she is getting IV fluid hydration as a 5 MLS per hour.  Assessment & Plan:   Principal Problem:   Right pontine stroke ([place])  Acute Respiratory Failure with Hypoxia due to likely recurrent aspiration pneumonia -She was hypoxic but hypoxia is resolved -SpO2: 93 % O2 Flow Rate (L/min): 3 L/min; no longer wearing 3 L of supplemental oxygen via nasal cannula -Agree with IV Unasyn and will continue to Monitor Respiratory Status Carefully -Repeating chest x-ray today and will continue to follow -She has now been placed on a Dysphagia 2 diet. -We will add flutter valve, incentive spirometry and change Robitussin-DM to the schedule guaifenesin 6 mg p.o. twice daily and leave the Robitussin-DM 5 to 10 mL every 6 hours as needed cough as necessary -Getting gentle IVF hydration with NS at 75 mL/hr  -WBC is trending down and went from 21.2 -> 13.3 -> 12.5 -Continue with albuterol 3 mL nebulized treatments every 4 hours as needed for wheezing or shortness of breath  Recent CVA-with left-sided hemiplegia/dysarthria PAD s/p Stenting  -Continue  Aspirin 81 mg p.o. daily as well as Clopidogrel 75 mg p.o. daily per neurology recommendations -Continue rehabilitative efforts   HLD -Continue rosuvastatin 20 g p.o. daily as well as ezetimibe 10 mg p.o. daily  Tobacco Abuse -Smoking cessation counseling given -Continue nicotine patch 20 mg transdermally every 24 hours   HTN -BP stable but on the lower side today -Continue Losartan 25 mg po Daily, Carvedilol 6.25 mg po BID, Isosorbide Mononitrate 60 mg po BID, Spironolactone 25 mg po Daily, -Agree with decreasing Torsemide to 10 mg po Daily    History of CAD-s/p CABG Chronic Diastolic Dsyfunction -No anginal symptoms -On dual antiplatelet and Statin as above -Also has NTG SL 0.4 mg q40min pRN [place] Pain -Continue to Monitor Volume Status carefully  Depression/Anxiety/PTSD -C/w Benztropine 0.5 mg po qHS -Chlorpromazine 100 mg po qHS -C/w Clonazepam 0.5 mg po BIDprn Anxiety/Agitation/Panic -C/w Fluoxetine 60 mg po Daily  -C/w Lamtorigine 200 mg po qHS  GERD -C/w PPI with Pantoprazole 40 mg po Daily    DM-2 -CBGs seem adequately well controlled -Continue 33 units of Lantus BID and Sensitive Novolog SSI AC/HS -Continue to monitor CBG's per protocol; CBGs ranging from 85-204   Obesity -Weight loss and Dietary Counseling given -BMI is not accurately documented   DVT prophylaxis: Enoxaparin 40 mg sq q24h Code Status: FULL CODE Family Communication: Discussed with Husband at bedside  Disposition Plan: Per Primary Team  Consultants:  TRH  Procedures: None  Antimicrobials:  Anti-infectives (From admission, onward)    Start  Dose/Rate Route Frequency Ordered Stop   07/03/21 1930  Ampicillin-Sulbactam (UNASYN) 3 g in sodium chloride 0.9 % 100 mL IVPB        3 g 200 mL/hr over 30 Minutes Intravenous Every 6 hours 07/03/21 1838     07/03/21 0400  vancomycin (VANCOREADY) IVPB 750 mg/150 mL  Status:  Discontinued        750 mg 150 mL/hr over 60 Minutes Intravenous  Every 12 hours 07/02/21 1426 07/03/21 0533   07/02/21 1600  ceFEPIme (MAXIPIME) 2 g in sodium chloride 0.9 % 100 mL IVPB  Status:  Discontinued        2 g 200 mL/hr over 30 Minutes Intravenous Every 8 hours 07/02/21 1353 07/03/21 0533   07/02/21 1400  vancomycin (VANCOREADY) IVPB 1250 mg/250 mL        1,250 mg 166.7 mL/hr over 90 Minutes Intravenous  Once 07/02/21 1312 07/02/21 1819   06/28/21 1500  Ampicillin-Sulbactam (UNASYN) 3 g in sodium chloride 0.9 % 100 mL IVPB  Status:  Discontinued        3 g 200 mL/hr over 30 Minutes Intravenous Every 6 hours 06/28/21 1321 07/02/21 1353        Subjective: Seen and examined and was working with Therapy. Thinks her Left Leg was a little swollen. NO CP or SOB.  Feels better than yesterday and has no issues breathing.  No other concerns or complaints at this time.  Objective: Vitals:   07/04/21 1505 07/04/21 2014 07/05/21 0534 07/05/21 0814  BP: 129/67 (!) 149/79 110/64 (!) 109/57  Pulse: (!) 51 (!) 51 (!) 54 (!) 57  Resp: 17 16 14    Temp: 98 F (36.7 C) 97.9 F (36.6 C) 97.9 F (36.6 C)   TempSrc: Oral Oral Oral   SpO2: 94% 95% 95% 93%  Weight:      Height:        Intake/Output Summary (Last 24 hours) at 07/05/2021 1159 Last data filed at 07/05/2021 0754 Gross per 24 hour  Intake 236 ml  Output 650 ml  Net -414 ml   Filed Weights   06/28/21 1313  Weight: 57.6 kg   Examination: Physical Exam:  Constitutional: WN/WD obese Caucasian female in NAD and appears calm Respiratory: Diminished to auscultation bilaterally with coarse breath sounds, no wheezing, rales, rhonchi or crackles. Normal respiratory effort and patient is not tachypenic. No accessory muscle use and not wearing any Supplemental O2 via [place] Cardiovascular: RRR, no murmurs / rubs / gallops. S1 and S2 auscultated. Mild 1+ LE Edema Slightly worse on the Left Abdomen: Soft, non-tender, Distended 2/2 body habitus. Bowel sounds positive.  GU: Deferred. Musculoskeletal: No  clubbing / cyanosis of digits/nails. No joint deformity upper and lower  Skin: No rashes, lesions, ulcers on a limited skin evaluation. No induration; Warm and dry.  Neurologic: CN 2-12 grossly intact with no focal deficits.Left sided weakness Psychiatric: Normal judgment and insight. Alert and oriented x 3. Normal mood and appropriate affect.   Data Reviewed: I have personally reviewed following labs and imaging studies  CBC: Recent Labs  Lab 06/30/21 0528 07/01/21 0515 07/02/21 0526 07/02/21 1319 07/03/21 0848 07/04/21 1014 07/05/21 0607  WBC 13.3* 13.4* 11.2* 10.7* 21.2* 13.3* 12.5*  NEUTROABS 9.4* 9.1* 6.7 7.2  --  9.7*  --   HGB 12.3 12.7 12.6 13.3 12.0 12.2 12.2  HCT 37.8 39.4 38.4 40.5 37.2 37.8 36.8  MCV 92.2 92.7 91.9 92.7 93.0 92.2 92.0  PLT 361 373 355 385 380 376  650   Basic Metabolic Panel: Recent Labs  Lab 07/01/21 0515 07/02/21 0526 07/02/21 1319 07/03/21 0848 07/04/21 1014 07/05/21 0959  NA  --  139 138 132* 138 136  K  --  4.0 4.4 4.0 4.1 3.7  CL  --  100 102 98 105 101  CO2  --  31 27 29 27 27   GLUCOSE  --  60* 68* 141* 87 124*  BUN  --  12 13 16 10 11   CREATININE  --  0.84 0.86 1.08* 0.88 0.81  CALCIUM  --  9.9 9.9 9.1 9.5 9.3  MG 2.2  --   --   --   --  1.9  PHOS  --   --   --   --   --  3.4   GFR: Estimated Creatinine Clearance: 78.1 mL/min (by C-G formula based on SCr of 0.81 mg/dL). Liver Function Tests: Recent Labs  Lab 06/29/21 0532 07/05/21 0959  AST 24 19  ALT 36 24  ALKPHOS 58 57  BILITOT 0.6 0.4  PROT 5.6* 6.1*  ALBUMIN 2.9* 2.8*   No results for input(s): LIPASE, AMYLASE in the last 168 hours. Recent Labs  Lab 07/03/21 1454  AMMONIA 38*   Coagulation Profile: No results for input(s): INR, PROTIME in the last 168 hours. Cardiac Enzymes: No results for input(s): CKTOTAL, CKMB, CKMBINDEX, TROPONINI in the last 168 hours. BNP (last 3 results) No results for input(s): PROBNP in the last 8760 hours. HbA1C: No results for  input(s): HGBA1C in the last 72 hours. CBG: Recent Labs  Lab 07/04/21 1137 07/04/21 1649 07/04/21 2130 07/05/21 0637 07/05/21 1134  GLUCAP 104* 145* 204* 85 119*   Lipid Profile: No results for input(s): CHOL, HDL, LDLCALC, TRIG, CHOLHDL, LDLDIRECT in the last 72 hours. Thyroid Function Tests: No results for input(s): TSH, T4TOTAL, FREET4, T3FREE, THYROIDAB in the last 72 hours. Anemia Panel: No results for input(s): VITAMINB12, FOLATE, FERRITIN, TIBC, IRON, RETICCTPCT in the last 72 hours. Sepsis Labs: Recent Labs  Lab 07/03/21 0935 07/03/21 0947 07/03/21 1228  PROCALCITON  --  0.12  --   LATICACIDVEN 0.8  --  0.7    Recent Results (from the past 240 hour(s))  Culture, Urine     Status: None   Collection Time: 06/29/21  5:51 PM   Specimen: Urine, Random  Result Value Ref Range Status   Specimen Description URINE, RANDOM  Final   Special Requests NONE  Final   Culture   Final    NO GROWTH Performed at [facility], [address], [place], [place] [address]    Report Status 07/01/2021 FINAL  Final  Culture, Urine     Status: None   Collection Time: 07/03/21  9:14 AM   Specimen: Urine, Random  Result Value Ref Range Status   Specimen Description URINE, RANDOM  Final   Special Requests NONE  Final   Culture   Final    NO GROWTH Performed at [facility], [place] [address] [place], [place] [address]    Report Status 07/04/2021 FINAL  Final     RN Pressure Injury Documentation:     Estimated body mass index is 20.5 kg/m as calculated from the following:   Height as of this encounter: 5\' 6"  (1.676 m).   Weight as of this encounter: 57.6 kg.  Malnutrition Type:   Malnutrition Characteristics:   Nutrition Interventions:    Radiology Studies: CT Angio Chest Pulmonary Embolism (PE) W or WO  Contrast  Result Date: 07/03/2021 CLINICAL DATA:  Shortness of breath, PE suspected EXAM: CT ANGIOGRAPHY CHEST WITH CONTRAST TECHNIQUE: Multidetector CT  imaging of the chest was performed using the standard protocol during bolus administration of intravenous contrast. Multiplanar CT image reconstructions and MIPs were obtained to evaluate the vascular anatomy. CONTRAST:  12mL OMNIPAQUE IOHEXOL 350 MG/ML SOLN COMPARISON:  June 24, 2021 FINDINGS: Cardiovascular: Satisfactory opacification of the pulmonary arteries to the segmental level. No evidence of pulmonary embolism. Similar enlargement of the main pulmonary artery measuring 3.1 cm. Aortic atherosclerosis without aneurysmal dilation. Normal heart size. No significant pericardial effusion/thickening. No suspicious intracardiac filling defect. Coronary artery calcifications. Surgical changes of prior CABG. Mediastinum/Nodes: No discrete thyroid nodule. No pathologically enlarged mediastinal, hilar or axillary lymph nodes. Patulous fluid-filled esophagus, similar prior. Trachea is unremarkable. Lungs/Pleura: Multifocal nodular opacities for instance in the left upper lobe on image 63/5 left lower lobe on image 67/5 and right lower lobe on image 72/5 and 100/5. Bronchial wall thickening. Scattered atelectasis. No pleural effusion or pneumothorax. Upper Abdomen: No acute abnormality. Musculoskeletal: No chest wall abnormality. No acute or significant osseous findings. Review of the MIP images confirms the above findings. IMPRESSION: 1. No evidence of pulmonary embolism. 2. Multifocal nodular and ground-glass opacities within both lungs, likely infectious or inflammatory. Which given the fluid-filled patulous appearance of the soft Gus may be related to aspiration. 3. Similar enlargement of the main pulmonary artery, which can be seen with pulmonary arterial hypertension. 4. Aortic Atherosclerosis (ICD10-I70.0). Electronically Signed   By: [name] Bailiff MD   On: 07/03/2021 18:21    Scheduled Meds:  aspirin  81 mg Oral Daily   benztropine  0.5 mg Oral QHS   carvedilol  6.25 mg Oral BID WC   chlorproMAZINE  100 mg  Oral QHS   clopidogrel  75 mg Oral Daily   cyanocobalamin  1,000 mcg Subcutaneous Q30 days   dextromethorphan-guaiFENesin  1 tablet Oral BID   enoxaparin (LOVENOX) injection  40 mg Subcutaneous Q24H   ezetimibe  10 mg Oral Daily   FLUoxetine  60 mg Oral Daily   hydrocortisone cream   Topical BID   insulin aspart  0-5 Units Subcutaneous QHS   insulin aspart  0-9 Units Subcutaneous TID WC   insulin glargine  33 Units Subcutaneous BID   isosorbide mononitrate  60 mg Oral BID   lamoTRIgine  200 mg Oral QHS   lidocaine  1 patch Transdermal Q24H   losartan  25 mg Oral Daily   nicotine  21 mg Transdermal Daily   pantoprazole  40 mg Oral Q1200   pregabalin  225 mg Oral BID   rosuvastatin  20 mg Oral Daily   spironolactone  25 mg Oral Daily   thiamine  100 mg Oral Daily   [START ON 07/06/2021] torsemide  10 mg Oral Daily   traZODone  50 mg Oral QHS   Vitamin D (Ergocalciferol)  50,000 Units Oral Q7 days   Continuous Infusions:  sodium chloride Stopped (07/03/21 0437)   sodium chloride 75 mL/hr at 07/05/21 0218   ampicillin-sulbactam (UNASYN) IV 3 g (07/05/21 0836)    LOS: 7 days   Kerney Elbe, DO [facility] PAGER is on AMION  If 7PM-7AM, please contact night-coverage [email or web address]

## 2021-07-05 NOTE — Progress Notes (Signed)
Slept good. Transfers and speech much better this morning. Patient reports she's not suppose to use the continuous pulse ox. Left resting hand splint and left PRAFO boot applied. Assisted to wheelchair this AM for breakfast. Erika [name] A

## 2021-07-05 NOTE — Progress Notes (Signed)
Modified Barium Swallow Progress Note  Patient Details  Name: Erika [name] MRN: [id number] Date of Birth: 1971-10-04  Today's Date: 07/05/2021  Modified Barium Swallow completed.  Full report located under Chart Review in the Imaging Section.  Brief recommendations include the following:  Clinical Impression  Patient demonstrates a moderate oral and mild pharyngeal dysphagia. Patient's oral phase is characterized by decreased bolus cohesion with intermittent premature spillage with thin liquids. Patient consistently able to trigger her swallow at the level of the pyriform sinuses and was able to fully protect her airway with both sips via cup and straw. Impaired mastication with use of a munching pattern, piecemeal swallowing and oral stasis was noted with solid textures.  Recommend patient continue current diet of Dys. 2 textures with thin liquids. Patient reports increased comfort with use of cup sips, therefore, recommend no straws at this time as patient can demonstrate use of smaller sips with increased oral control and to maximize patient's comfort. Also recommend continuing strict aspiration precautions of out of bed for meals, limit distractions, check for oral stasis and meds whole in puree. Educated both the patient and her husband regarding results, both verbalize understanding.   Swallow Evaluation Recommendations       SLP Diet Recommendations: Dysphagia 2 (Fine chop) solids;Thin liquid   Liquid Administration via: Cup;No straw   Medication Administration: Whole meds with puree   Supervision: Patient able to self feed;Full supervision/cueing for compensatory strategies   Compensations: Minimize environmental distractions;Small sips/bites;Slow rate;Lingual sweep for clearance of pocketing   Postural Changes:  (out of bed for meals)   Oral Care Recommendations: Oral care BID        Refugio Mcconico 07/05/2021,12:30 PM

## 2021-07-05 NOTE — Progress Notes (Signed)
Physical Therapy Session Note  Patient Details  Name: Erika [name] MRN: [id number] Date of Birth: [birth year]  Today's Date: 07/05/2021 PT Individual Time: 1130-1155 + [phone] PT Individual Time Calculation (min): 25 min + 58 min  Short Term Goals: Week 1:  PT Short Term Goal 1 (Week 1): Pt will perform bed mobility with minA. PT Short Term Goal 2 (Week 1): Pt will perform sit to stand transfer with minA. PT Short Term Goal 3 (Week 1): Pt perform bed to chair transfer with minA. PT Short Term Goal 4 (Week 1): Pt will ambulate x25' with modA.  Skilled Therapeutic Interventions/Progress Updates:     1st session: Pt greeted seated in w/c to start session. Husband at bedside. Pt agreeable to therapy without reports of pian. Pt reporting that OT requested this Therapist to remove the saebo stim from her L deltoid - this was completed and placed in bag for later use. Pt was wheeled with [name] for time management to main rehab gym. Completed squat<>pivot transfer with maxA and L knee block from w/c to mat table - cues for forward weight shift and sequencing. Worked on repeated  (1x15) sit<>stands with minA and L knee block from lowered mat table height - once upright, needs cues for reducing R rotation. Next, worked on LLE stance phase control with forward/backward stepping with RLE to target - unsupported and required modA for balance. She had several posterior LOB while completing this and had increased difficulty activating posterior chain to support. Pt also expresses fearfulness of falling but confidence improves with reps. Completed squat<>pivot transfer with modA back to her w/c and wheeled back to her room. Remained seated in w/c with chair alarm on and all needs met, husband at bedside.   2nd session: Pt received seated in w/c and ready to start therapy. No reports of pain. Pt with PIV running - asked RN to disconnected to allow freedom of mobility but RN reports it needs to be  continuous. Removed hospital socks and donned regular socks with totalA, donned tennis shoes with totalA as well. Wheeled in w/c to main rehab gym for time management. With use of R hand rail in hallway and modA with +2 assist for w/c follow and IV pole management, she ambulated ~65ft with her L arm draped over therapist's shoulder. PT assisted with LLE facilitation for swing/placement and assist for trunk management due to forward lean. Pt able to initiate hip flexors to swing but needed assist for foot clearance - may benefit from toe cap to reduce friction. Next, introduced Eva walker and instructed pt on purpose and positioning - required +3 assist (+1 for stabilizing [name], +1 for w/c follow and IV pole management, and +1 for gait mechanics). With therapist on stool behind her, facilitated pelvic rotation and LLE swing - able to initiate hip flexion but unable to lift to clear for stepping pattern - ambulated a total of ~43ft with this method. Next, wheeled back inside rehab gym and worked on static standing with [name] with minA for balance while completing functional reaching with RUE for cone stacking across midline and laterally to promote LLE weight shift. Also worked on [place] but required maxA for [employer] while doing this. Pt completed squat<>pivot transfer with maxA back to her w/c and then was wheeled back to her room where she remained seated in w/c with all needs met and husband at bedside who was updated on pt's mobility status and tolerance to treatment.  Therapy Documentation Precautions:  Precautions Precautions: Fall Precaution Comments: L hemiplegia Restrictions Weight Bearing Restrictions: No LUE Weight Bearing: Weight bearing as tolerated LLE Weight Bearing: Weight bearing as tolerated General:    Therapy/Group: Individual Therapy  [name] 07/05/2021, 12:22 PM

## 2021-07-06 ENCOUNTER — Inpatient Hospital Stay (HOSPITAL_COMMUNITY): Payer: Managed Care, Other (non HMO)

## 2021-07-06 LAB — GLUCOSE, CAPILLARY
Glucose-Capillary: 74 mg/dL (ref 70–99)
Glucose-Capillary: 114 mg/dL — ABNORMAL HIGH (ref 70–99)
Glucose-Capillary: 118 mg/dL — ABNORMAL HIGH (ref 70–99)
Glucose-Capillary: 196 mg/dL — ABNORMAL HIGH (ref 70–99)

## 2021-07-06 LAB — MAGNESIUM: Magnesium: 1.9 mg/dL (ref 1.7–2.4)

## 2021-07-06 LAB — COMPREHENSIVE METABOLIC PANEL
ALT: 26 U/L (ref 0–44)
AST: 22 U/L (ref 15–41)
Albumin: 3.1 g/dL — ABNORMAL LOW (ref 3.5–5.0)
Alkaline Phosphatase: 58 U/L (ref 38–126)
Anion gap: 10 (ref 5–15)
BUN: 13 mg/dL (ref 6–20)
CO2: 25 mmol/L (ref 22–32)
Calcium: 9.9 mg/dL (ref 8.9–10.3)
Chloride: 102 mmol/L (ref 98–111)
Creatinine, Ser: 0.8 mg/dL (ref 0.44–1.00)
GFR, Estimated: >60 mL/min (ref >60)
Glucose, Bld: 134 mg/dL — ABNORMAL HIGH (ref 70–99)
Potassium: 4.3 mmol/L (ref 3.5–5.1)
Sodium: 137 mmol/L (ref 135–145)
Total Bilirubin: 0.4 mg/dL (ref 0.3–1.2)
Total Protein: 6.5 g/dL (ref 6.5–8.1)

## 2021-07-06 LAB — CBC WITH DIFFERENTIAL/PLATELET
Abs Immature Granulocytes: 0.25 10*3/uL — ABNORMAL HIGH (ref 0.00–0.07)
Basophils Absolute: 0.1 10*3/uL (ref 0.0–0.1)
Basophils Relative: 0 %
Eosinophils Absolute: 0.2 10*3/uL (ref 0.0–0.5)
Eosinophils Relative: 2 %
HCT: 37.9 % (ref 36.0–46.0)
Hemoglobin: 12.4 g/dL (ref 12.0–15.0)
Immature Granulocytes: 2 %
Lymphocytes Relative: 20 %
Lymphs Abs: 2.4 10*3/uL (ref 0.7–4.0)
MCH: 30 pg (ref 26.0–34.0)
MCHC: 32.7 g/dL (ref 30.0–36.0)
MCV: 91.8 fL (ref 80.0–100.0)
Monocytes Absolute: 0.7 10*3/uL (ref 0.1–1.0)
Monocytes Relative: 6 %
Neutro Abs: 8.5 10*3/uL — ABNORMAL HIGH (ref 1.7–7.7)
Neutrophils Relative %: 70 %
Platelets: 405 10*3/uL — ABNORMAL HIGH (ref 150–400)
RBC: 4.13 MIL/uL (ref 3.87–5.11)
RDW: 13.7 % (ref 11.5–15.5)
WBC: 12 10*3/uL — ABNORMAL HIGH (ref 4.0–10.5)
nRBC: 0 % (ref 0.0–0.2)

## 2021-07-06 LAB — PHOSPHORUS: Phosphorus: 3.8 mg/dL (ref 2.5–4.6)

## 2021-07-06 MED ORDER — SODIUM CHLORIDE 0.9% FLUSH
10.0000 mL | Freq: Two times a day (BID) | INTRAVENOUS | Status: DC
Start: 2021-07-06 — End: 2021-07-21
  Administered 2021-07-06 – 2021-07-16 (×20): 10 mL
  Administered 2021-07-17: 30 mL
  Administered 2021-07-17: 10 mL
  Administered 2021-07-18 – 2021-07-19 (×2): 20 mL

## 2021-07-06 MED ORDER — SODIUM CHLORIDE 0.9% FLUSH
10.0000 mL | INTRAVENOUS | Status: DC | PRN
  Administered 2021-07-07 – 2021-07-10 (×2): 10 mL

## 2021-07-06 MED ORDER — CARVEDILOL 3.125 MG PO TABS
3.1250 mg | ORAL_TABLET | Freq: Two times a day (BID) | ORAL | Status: DC
  Administered 2021-07-06 – 2021-07-13 (×14): 3.125 mg via ORAL
  Filled 2021-07-06 (×14): qty 1

## 2021-07-06 MED ORDER — POLYETHYLENE GLYCOL 3350 17 G PO PACK
17.0000 g | PACK | Freq: Every day | ORAL | Status: DC
  Administered 2021-07-06 – 2021-07-10 (×4): 17 g via ORAL
  Filled 2021-07-06 (×5): qty 1

## 2021-07-06 NOTE — Progress Notes (Signed)
PROGRESS NOTE   Subjective/Complaints: Feeling much better today Has no complaints other than headache, which is stable. She has everyday and has failed multiple treatment options- discussed outpatient Botox as an option.   ROS: +left sided weakness, +depression, +anxiety, +cough, denies any more shortness of breath, +headache   Objective:   DG Swallowing Func-Speech Pathology  Result Date: 07/05/2021 Formatting of this result is different from the original. Objective Swallowing Evaluation: Type of Study: MBS-Modified Barium Swallow Study  Patient Details Name: Erika [name] MRN: [id number] Date of Birth: [birth year] Today's Date: 07/05/2021 Past Medical History: Past Medical History: Diagnosis Date  Arthralgia of temporomandibular joint   CAD, multiple vessel   a. 06/2016 Cath: ostLM 40%, ostLAD 40%, pLAD 95%, ost-pLCx 60%, pLCx 95%, mLCx 60%, mRCA 95%, D2 50%, LVSF nl;  b. 07/2016 CABG x 4 (LIMA->LAD, VG->Diag, VG->OM, VG->RCA); c. 08/2016 Cath: 3VD w/ 4/4 patent grafts. LAD distal to LIMA has diff dzs->Med rx; d. 08/2020 Cath: 4/4 patent grafts, native 3VD. EF 55-65%-->Med Rx.  Carotid arterial disease ([place])   a. 07/2016 s/p R CEA; b. 02/2021 U/S: RICA 81-27%, LICA 5-17%.  Clotting disorder ([place])   Depression   Diastolic dysfunction   a. 06/2016 Echo: EF 50-55%, mild inf wall HK, GR1DD, mild MR, RV sys fxn nl, mildly dilated LA, PASP nl  Fatty liver disease, nonalcoholic 0017  History of blood transfusion   with heart surgery  HLD (hyperlipidemia)   Labile hypertension   a. prior renal ngiogram negative for RAS in 03/2016; b. catecholamines and metanephrines normal, mildly elevated renin with normal aldosterone and normal ratio in 02/2016  Myocardial infarction [facility]) 2017  Obesity   PAD (peripheral artery disease) ([place])   a. 09/2018 s/p L SFA stenting; b. 07/2019 Periph Angio: Patent m/d L SFA stent w/ 100% L SFA distal to stent. L AT 100d, L  Peroneal diff dzs-->Med Rx; c. 02/2021 ABIs: stable @ 0.61 on R and 0.46 on L.  PTSD (post-traumatic stress disorder)   Tobacco abuse   Type 2 diabetes mellitus ([place]) 12/2015 Past Surgical History: Past Surgical History: Procedure Laterality Date  ABDOMINAL AORTOGRAM W/LOWER EXTREMITY [name] 10/15/2018  Procedure: ABDOMINAL AORTOGRAM W/LOWER EXTREMITY;  Surgeon: Wellington Hampshire, MD;  Location: [place] [facility];  Service: Cardiovascular;  Laterality: [name];  ABDOMINAL AORTOGRAM W/LOWER EXTREMITY Bilateral 08/19/2019  Procedure: ABDOMINAL AORTOGRAM W/LOWER EXTREMITY;  Surgeon: Wellington Hampshire, MD;  Location: [place] [facility];  Service: Cardiovascular;  Laterality: Bilateral;  CARDIAC CATHETERIZATION [name] 06/29/2016  Procedure: Left Heart Cath and Coronary Angiography;  Surgeon: Minna Merritts, MD;  Location: [place] [facility];  Service: Cardiovascular;  Laterality: [name];  CARDIAC CATHETERIZATION [name] 08/29/2016  Procedure: Left Heart Cath and Cors/Grafts Angiography;  Surgeon: Wellington Hampshire, MD;  Location: [place] [facility];  Service: Cardiovascular;  Laterality: [name];  CESAREAN SECTION    CHOLECYSTECTOMY    CORONARY ARTERY BYPASS GRAFT [name] 07/06/2016  Procedure: CORONARY ARTERY BYPASS GRAFTING (CABG) x four, using left internal mammary artery and right leg greater saphenous vein harvested endoscopically;  Surgeon: Ivin Poot, MD;  Location: [place];  Service: Open Heart Surgery;  Laterality: [name];  ENDARTERECTOMY  Right 07/06/2016  Procedure: ENDARTERECTOMY CAROTID;  Surgeon: Rosetta Posner, MD;  Location: [facility];  Service: Vascular;  Laterality: Right;  ENDARTERECTOMY Right 04/27/2020  Procedure: REDO OF RIGHT ENDARTERECTOMY CAROTID;  Surgeon: Rosetta Posner, MD;  Location: [facility] OR;  Service: Vascular;  Laterality: Right;  LEFT HEART CATH AND CORS/GRAFTS ANGIOGRAPHY [name] 08/24/2020  Procedure: LEFT HEART CATH AND CORS/GRAFTS ANGIOGRAPHY;  Surgeon: Wellington Hampshire, MD;  Location: [place] [facility];  Service: Cardiovascular;   Laterality: [name];  PERIPHERAL VASCULAR CATHETERIZATION [name] 04/18/2016  Procedure: Renal Angiography;  Surgeon: Wellington Hampshire, MD;  Location: [place] [facility];  Service: Cardiovascular;  Laterality: [name];  PERIPHERAL VASCULAR INTERVENTION Left 10/15/2018  Procedure: PERIPHERAL VASCULAR INTERVENTION;  Surgeon: Wellington Hampshire, MD;  Location: [place] [facility];  Service: Cardiovascular;  Laterality: Left;  Left superficial femoral  TEE WITHOUT CARDIOVERSION [name] 07/06/2016  Procedure: TRANSESOPHAGEAL ECHOCARDIOGRAM (TEE);  Surgeon: Ivin Poot, MD;  Location: [place];  Service: Open Heart Surgery;  Laterality: [name];  TONSILLECTOMY   HPI: See H&P  Subjective: pt awake, resting in bed in room. Husband present. Noted much improved Left orofacial weakness at rest. Wearing Dentures now. Assessment / Plan / Recommendation CHL IP CLINICAL IMPRESSIONS 07/05/2021 Clinical Impression Patient demonstrates a moderate oral and mild pharyngeal dysphagia. Patient's oral phase is characterized by decreased bolus cohesion with intermittent premature spillage with thin liquids. Patient consistently able to trigger her swallow at the level of the pyriform sinuses and was able to fully protect her airway with both sips via cup and straw. Impaired mastication with use of a munching pattern, piecemeal swallowing and oral stasis was noted with solid textures.  Recommend patient continue current diet of Dys. 2 textures with thin liquids. Patient reports increased comfort with use of cup sips, therefore, recommend no straws at this time as patient can demonstrate use of smaller sips with increased oral control and to maximize patient's comfort. Also recommend continuing strict aspiration precautions of out of bed for meals, limit distractions, check for oral stasis and meds whole in puree. Educated both the patient and her husband regarding results, both verbalize understanding. SLP Visit Diagnosis Dysphagia, oropharyngeal phase (R13.12)  Attention and concentration deficit following -- Frontal lobe and executive function deficit following -- Impact on safety and function Mild aspiration risk   CHL IP TREATMENT RECOMMENDATION 07/05/2021 Treatment Recommendations Therapy as outlined in treatment plan below   Prognosis 07/05/2021 Prognosis for Safe Diet Advancement Good Barriers to Reach Goals Cognitive deficits Barriers/Prognosis Comment -- CHL IP DIET RECOMMENDATION 07/05/2021 SLP Diet Recommendations Dysphagia 2 (Fine chop) solids;Thin liquid Liquid Administration via Cup;No straw Medication Administration Whole meds with puree Compensations Minimize environmental distractions;Small sips/bites;Slow rate;Lingual sweep for clearance of pocketing Postural Changes (No Data)   CHL IP OTHER RECOMMENDATIONS 07/05/2021 Recommended Consults -- Oral Care Recommendations Oral care BID Other Recommendations --   CHL IP FOLLOW UP RECOMMENDATIONS 07/05/2021 Follow up Recommendations Inpatient Rehab   CHL IP FREQUENCY AND DURATION 07/05/2021 Speech Therapy Frequency (ACUTE ONLY) min 3x week Treatment Duration 3 weeks      CHL IP ORAL PHASE 07/05/2021 Oral Phase Impaired Oral - Pudding Teaspoon -- Oral - Pudding Cup -- Oral - Honey Teaspoon -- Oral - Honey Cup -- Oral - Nectar Teaspoon -- Oral - Nectar Cup -- Oral - Nectar Straw -- Oral - Thin Teaspoon -- Oral - Thin Cup Decreased bolus cohesion;Premature spillage;Weak lingual manipulation Oral - Thin Straw Decreased bolus cohesion;Premature spillage Oral - Puree  WFL Oral - Mech Soft Impaired mastication;Decreased bolus cohesion;Piecemeal swallowing;Lingual pumping Oral - Regular -- Oral - Multi-Consistency -- Oral - Pill -- Oral Phase - Comment --  CHL IP PHARYNGEAL PHASE 07/05/2021 Pharyngeal Phase Impaired Pharyngeal- Pudding Teaspoon -- Pharyngeal -- Pharyngeal- Pudding Cup -- Pharyngeal -- Pharyngeal- Honey Teaspoon -- Pharyngeal -- Pharyngeal- Honey Cup -- Pharyngeal -- Pharyngeal- Nectar Teaspoon -- Pharyngeal --  Pharyngeal- Nectar Cup -- Pharyngeal -- Pharyngeal- Nectar Straw -- Pharyngeal -- Pharyngeal- Thin Teaspoon -- Pharyngeal -- Pharyngeal- Thin Cup Penetration/Aspiration during swallow;Delayed swallow initiation-pyriform sinuses Pharyngeal Material enters airway, remains ABOVE vocal cords then ejected out Pharyngeal- Thin Straw Delayed swallow initiation-pyriform sinuses Pharyngeal Material does not enter airway Pharyngeal- Puree Delayed swallow initiation-vallecula Pharyngeal Material does not enter airway Pharyngeal- Mechanical Soft Delayed swallow initiation-vallecula Pharyngeal Material does not enter airway Pharyngeal- Regular -- Pharyngeal -- Pharyngeal- Multi-consistency -- Pharyngeal -- Pharyngeal- Pill -- Pharyngeal -- Pharyngeal Comment --  No flowsheet data found. PAYNE, COURTNEY 07/05/2021, 12:31 PM    Weston Anna, MA, CCC-SLP           Recent Labs    07/04/21 1014 07/05/21 0607  WBC 13.3* 12.5*  HGB 12.2 12.2  HCT 37.8 36.8  PLT 376 354   Recent Labs    07/04/21 1014 07/05/21 0959  NA 138 136  K 4.1 3.7  CL 105 101  CO2 27 27  GLUCOSE 87 124*  BUN 10 11  CREATININE 0.88 0.81  CALCIUM 9.5 9.3    Intake/Output Summary (Last 24 hours) at 07/06/2021 0953 Last data filed at 07/06/2021 0916 Gross per 24 hour  Intake 653 ml  Output 700 ml  Net -47 ml        Physical Exam: Vital Signs Blood pressure (!) 144/56, pulse (!) 56, temperature 98.2 F (36.8 C), temperature source Oral, resp. rate 14, height 5\' 6"  (1.676 m), weight 57.6 kg, SpO2 94 %. Gen: no distress, normal appearing HEENT: oral mucosa pink and moist, NCAT Cardio: Bradycardia Chest: normal effort, normal rate of breathing Abd: soft, non-distended Ext: no edema  Psych: pleasant, flat affect Skin: intact Neurological:    Mental Status: She is alert and oriented to person, place, and time.    Comments: Left facial weakness with mild dysarthria. Able to answer orientation questions and follow commands  without difficulty.  0/5 LUE and LLE 4/5 on Right side    Assessment/Plan: 1. Functional deficits which require 3+ hours per day of interdisciplinary therapy in a comprehensive inpatient rehab setting. Physiatrist is providing close team supervision and 24 hour management of active medical problems listed below. Physiatrist and rehab team continue to assess barriers to discharge/monitor patient progress toward functional and medical goals  Care Tool:  Bathing    Body parts bathed by patient: Chest, Abdomen, Front perineal area, Right upper leg, Left upper leg, Face, Left arm   Body parts bathed by helper: Right arm, Buttocks, Right lower leg, Left lower leg     Bathing assist Assist Level: Moderate Assistance - Patient 50 - 74%     Upper Body Dressing/Undressing Upper body dressing   What is the patient wearing?: Pull over shirt    Upper body assist Assist Level: Moderate Assistance - Patient 50 - 74%    Lower Body Dressing/Undressing Lower body dressing      What is the patient wearing?: Pants     Lower body assist Assist for lower body dressing: Maximal Assistance - Patient 25 - 49%     Toileting  Toileting    Toileting assist Assist for toileting: Minimal Assistance - Patient > 75%     Transfers Chair/bed transfer  Transfers assist     Chair/bed transfer assist level: Moderate Assistance - Patient 50 - 74%     Locomotion Ambulation   Ambulation assist      Assist level: 2 helpers Assistive device: Hand held assist Max distance: 20'   Walk 10 feet activity   Assist     Assist level: 2 helpers Assistive device: Hand held assist   Walk 50 feet activity   Assist Walk 50 feet with 2 turns activity did not occur: Safety/medical concerns  Assist level: Dependent - Patient 0% Assistive device: Lite Gait    Walk 150 feet activity   Assist Walk 150 feet activity did not occur: Safety/medical concerns         Walk 10 feet on uneven  surface  activity   Assist Walk 10 feet on uneven surfaces activity did not occur: Safety/medical concerns         Wheelchair     Assist Will patient use wheelchair at discharge?: No Type of Wheelchair: Manual    Wheelchair assist level: Minimal Assistance - Patient > 75% Max wheelchair distance: 129ft    Wheelchair 50 feet with 2 turns activity    Assist        Assist Level: Minimal Assistance - Patient > 75%   Wheelchair 150 feet activity     Assist          Blood pressure (!) 144/56, pulse (!) 56, temperature 98.2 F (36.8 C), temperature source Oral, resp. rate 14, height 5\' 6"  (1.676 m), weight 57.6 kg, SpO2 94 %.    Medical Problem List and Plan: 1.  Right pontine stroke             -patient may shower             -ELOS: 8/2             -husband may come in before 8am  Continue CIR PT, OT  2.  Impaired mobility: Continue Lovenox             -antiplatelet therapy: Continue ASA/Plavix 3. Cluster headaches and migraines: continue abortive oxygen PRN. Oxycodone prn for HA. Discussed outpatient Botox- patient would like to try.  4. Mood: Team to provide ego support. LCSW to follow for evaluation and support.             -antipsychotic agents: Lamictal daily. 5. Neuropsych: This patient is capable of making decisions on her own behalf. 6. Skin/Wound Care: Routine pressure relief measures. 7. Fluids/Electrolytes/Nutrition: Monitor I/O. Check lytes in am. 8. HTN: SBP goal 140-180 range to avoid hypoperfusion of brain. Per husband "acts drunk when BP is low" --Continue Imdur, Coreg, aldactone, Demadex, Per reports 7/14: BP below goal given stenosis: decrease coreg to 3.125mg  BID.  Vitals:   07/05/21 2046 07/06/21 0531  BP: (!) 148/67 (!) 144/56  Pulse: (!) 57 (!) 56  Resp: 14   Temp: 98.6 F (37 C) 98.2 F (36.8 C)  SpO2: 91% 94%  Avoid hypoperfusion, TRH following BP management , neg fluid balance reduce diuretic -discussed with TRH 9. T2DM:  Hgb A1C- 10.7. BS run 200's at home. Dietary education/reinforcement.             --Continue Lantus  45 units BID with SSI for elevated BS             --7/10: hypoglycemic  to 58l, decrease Lantus to 35U BID CBG (last 3)  Recent Labs    07/05/21 1649 07/05/21 2133 07/06/21 0601  GLUCAP 158* 143* 74  CBG a little low will reduce lantus to 33U   10. CAD s/p CABG: On ASA, coreg, Cozaar, Demadex, ASA, 11. PAD w/claudication: Has been smoking 1 PPD --quit at admission. Continue Nicotine patch. --On lyrica BID to manage symptoms.   12. Aspiration PNA: CT reviewed, appreciate medicine consult, WBC up, perhaps new aspiration pneumonia and started on Unasyn IV 7/11.  13. Chronic cluster headaches, currently well controlled: Managed with thorazine as well as oxycodone and high flow oxygen prn. Decreased oxycodone to TID PRN and edited order to indicate it should be given after therapy as per husband's request- makes her too sedated to tolerate therapy.  14. H/o depression/anxiety: Continue Lamictal, Prozac with Klonopin prn. 15. Chronic insomnia: Managed with home dose trazodone. 16. Dyslipidemia: Trig- 636, direct LDL-99.6, HDL 29, Chol 202. On Zetia--to start Vespc 17. Vitamin B 12 deficiency: B12- 169 (was WNL a year ago) 18. NASH/Fatty liver: LFTs reviewed and normalized 19. Dense left sided hemiplegia: [place] regarding NMES- started trials Friday. They have TENS unit at home.  20. Hypokalemia: supplement 96meq 7/7 and repeat BMP reviewed, K+ improved to 3.8. Resolved. D/c daily supplement.  21. Leukocytosis: trending upward potentially secondary to aspiration pneumonia, repeat today 22. Vitamin D deficiency: start ergocalciferol 50,000U weekly for 7 weeks.  23.  Hypoxia , was not on O2 at admission, has been treated for asp PNA with IV abx for several days and WBC are normalizing.  ? Fluid overload from IV abx, crackles at bases but recent ECHO looked ok  Will ask IM to consult  24.  AKI: repeat BMP today.  24. Disposition: Lives with husband and daughter (autistic but high functioning)- husband very involved and has already coordinated with his boss to take time off to care for her. Messaged April to schedule HFU/TC.    LOS: 8 days A FACE TO FACE EVALUATION WAS PERFORMED  Clide Deutscher Veronica Fretz 07/06/2021, 9:53 AM

## 2021-07-06 NOTE — Progress Notes (Signed)
Nutrition Brief Note  RD contacted via PA, Pam, regarding pt and husband with concerns/difficulties with meal ordering and food items on meal tray. Husband voiced multiple concerns regarding limited food choices on menu for dysphagia 2 diet with thin liquids and incorrect received foods. Pt food preferences discussed and menu reviewed with pt and husband. RD to contact patient services manager to further discuss meal tray/kitchen concerns. Re-consult RD if nutritional issues arise.   Corrin Parker, MS, RD, LDN RD pager number/after hours weekend pager number on Amion.

## 2021-07-06 NOTE — Progress Notes (Signed)
PROGRESS NOTE    Erika [name]  KDX:[id number] DOB: November 09, 1971 DOA: 06/28/2021 PCP: Lesleigh Noe, MD   Brief Narrative:  Patient is a 50 year old Caucasian female who was hospitalized from 06/24/21 until 06/28/2021 for acute CVA.  Her hospital course was complicated by hypoxia and sepsis due to aspiration event she is ultimately discharged to CIR.  While she was at [facility] noted to have further hypoxia and evaluation revealed recurrent aspiration pneumonia.  TRH was consulted by evaluation by the CIR MD. she has been placed on IV Unasyn and her leukocytosis is improving as well as her respiratory status.  She is no longer hypoxic and will need an ambulatory home O2 screen prior to discharge.  We will repeat chest x-ray today still pending to be done.  She will also have a repeat swallow study done and she has now been placed on a dysphagia 2 diet.  Currently she was getting IV fluid hydration at 75 MLS per hour but will stop now.   Assessment & Plan:   Principal Problem:   Right pontine stroke ([place])  Acute Respiratory Failure with Hypoxia due to likely recurrent aspiration pneumonia -She was hypoxic but hypoxia is resolved -SpO2: 94 % O2 Flow Rate (L/min): 3 L/min; no longer wearing 3 L of supplemental oxygen via nasal cannula -Agree with IV Unasyn and will continue to Monitor Respiratory Status Carefully; Recommending Stopping IV Unasyn on 07/08/21 -She has now been placed on a Dysphagia 2 diet. -We will add flutter valve, incentive spirometry and change Robitussin-DM to the schedule guaifenesin 6 mg p.o. twice daily and leave the Robitussin-DM 5 to 10 mL every 6 hours as needed cough as necessary -Was Getting gentle IVF hydration with NS at 75 mL/hr but now stopped -WBC is trending down and went from 21.2 -> 13.3 -> 12.5 -> 12.0 -Continue with albuterol 3 mL nebulized treatments every 4 hours as needed for wheezing or shortness of breath -Repeating CXR this AM and will order an Ambulatory  Home O2 Screen  Recent CVA-with left-sided hemiplegia/dysarthria PAD s/p Stenting  -Continue Aspirin 81 mg p.o. daily as well as Clopidogrel 75 mg p.o. daily per neurology recommendations -Continue rehabilitative efforts with Aggressive PT/OT    HLD -Continue Rosuvastatin 20 g p.o. daily as well as ezetimibe 10 mg p.o. daily  Tobacco Abuse -Smoking cessation counseling given -Continue nicotine patch 20 mg transdermally every 24 hours   HTN -BP stable but on the lower side today -Continue Losartan 25 mg po Daily, Carvedilol 6.25 mg po BID being reduced to 3.125 mg po BID, Isosorbide Mononitrate 60 mg po BID, Spironolactone 25 mg po Daily, -Agree with decreasing Torsemide to 10 mg po Daily    History of CAD-s/p CABG Chronic Diastolic Dsyfunction -No anginal symptoms -On dual antiplatelet and Statin as above -Also has NTG SL 0.4 mg q93min pRN [place] [facility] -Continue to Monitor Volume Status carefully as she was getting IVF. Will stop IVF today   Depression/Anxiety/PTSD -C/w Benztropine 0.5 mg po qHS -Chlorpromazine 100 mg po qHS -C/w Clonazepam 0.5 mg po BIDprn Anxiety/Agitation/Panic -C/w Fluoxetine 60 mg po Daily  -C/w Lamtorigine 200 mg po qHS  Thrombocytosis -Likely Reactive -Platelet Count went from 354 -> 405 -Continue to Monitor and Trend   GERD -C/w PPI with Pantoprazole 40 mg po Daily    DM-2 -CBGs seem adequately well controlled -Continue 33 units of Lantus BID and Sensitive Novolog SSI AC/HS -Continue to monitor CBG's per protocol; CBGs ranging from 74-158; Blood Sugar  this AM on CMP was 134   Obesity -Weight loss and Dietary Counseling given -BMI is not accurately documented   DVT prophylaxis: Enoxaparin 40 mg sq q24h Code Status: FULL CODE Family Communication: Discussed with Husband at bedside  Disposition Plan: Per Primary Team  Consultants:  TRH  Procedures: None  Antimicrobials:  Anti-infectives (From admission, onward)    Start     Dose/Rate  Route Frequency Ordered Stop   07/03/21 1930  Ampicillin-Sulbactam (UNASYN) 3 g in sodium chloride 0.9 % 100 mL IVPB        3 g 200 mL/hr over 30 Minutes Intravenous Every 6 hours 07/03/21 1838     07/03/21 0400  vancomycin (VANCOREADY) IVPB 750 mg/150 mL  Status:  Discontinued        750 mg 150 mL/hr over 60 Minutes Intravenous Every 12 hours 07/02/21 1426 07/03/21 0533   07/02/21 1600  ceFEPIme (MAXIPIME) 2 g in sodium chloride 0.9 % 100 mL IVPB  Status:  Discontinued        2 g 200 mL/hr over 30 Minutes Intravenous Every 8 hours 07/02/21 1353 07/03/21 0533   07/02/21 1400  vancomycin (VANCOREADY) IVPB 1250 mg/250 mL        1,250 mg 166.7 mL/hr over 90 Minutes Intravenous  Once 07/02/21 1312 07/02/21 1819   06/28/21 1500  Ampicillin-Sulbactam (UNASYN) 3 g in sodium chloride 0.9 % 100 mL IVPB  Status:  Discontinued        3 g 200 mL/hr over 30 Minutes Intravenous Every 6 hours 06/28/21 1321 07/02/21 1353        Subjective: Seen and examined while sitting in the chair at bedside and was doing okay.  Denied any chest pain or shortness of breath.  Feels a little bit better respiratory wise.  No nausea or vomiting.  No other concerns or complaints at this time.  Objective: Vitals:   07/05/21 0814 07/05/21 1542 07/05/21 2046 07/06/21 0531  BP: (!) 109/57 127/66 (!) 148/67 (!) 144/56  Pulse: (!) 57 (!) 59 (!) 57 (!) 56  Resp:  15 14   Temp:  98.6 F (37 C) 98.6 F (37 C) 98.2 F (36.8 C)  TempSrc:   Oral Oral  SpO2: 93% 90% 91% 94%  Weight:      Height:        Intake/Output Summary (Last 24 hours) at 07/06/2021 1235 Last data filed at 07/06/2021 0973 Gross per 24 hour  Intake 653 ml  Output 700 ml  Net -47 ml    Filed Weights   06/28/21 1313  Weight: 57.6 kg   Examination: Physical Exam:  Constitutional: WN/WD obese Caucasian female in NAD and appears calm  Respiratory: Mildly diminished to auscultation bilaterally with coarse breath sounds, no wheezing, rales, rhonchi  or crackles. Normal respiratory effort and patient is not tachypenic. No accessory muscle use. Not wearing Supplemental O2 via [place] and has unlabored breathing  Cardiovascular: RRR, no murmurs / rubs / gallops. S1 and S2 auscultated. 1+ LE edema slightly worse on the left compared to the right Abdomen: Soft, non-tender, Distended 2/2 body habitus. Bowel sounds positive.  GU: Deferred. Musculoskeletal: No clubbing / cyanosis of digits/nails. No joint deformity upper and lower extremities.  Skin: No rashes, lesions, ulcers on a limited skin evaluation. No induration; Warm and dry.  Neurologic: CN 2-12 grossly intact with no focal deficits. Romberg sign and cerebellar reflexes not assessed. Left Sided weakness noted Psychiatric: Normal judgment and insight. Alert and oriented x 3. Normal  mood and appropriate affect.   Data Reviewed: I have personally reviewed following labs and imaging studies  CBC: Recent Labs  Lab 07/01/21 0515 07/02/21 0526 07/02/21 1319 07/03/21 0848 07/04/21 1014 07/05/21 0607 07/06/21 1043  WBC 13.4* 11.2* 10.7* 21.2* 13.3* 12.5* 12.0*  NEUTROABS 9.1* 6.7 7.2  --  9.7*  --  8.5*  HGB 12.7 12.6 13.3 12.0 12.2 12.2 12.4  HCT 39.4 38.4 40.5 37.2 37.8 36.8 37.9  MCV 92.7 91.9 92.7 93.0 92.2 92.0 91.8  PLT 373 355 385 380 376 354 405*    Basic Metabolic Panel: Recent Labs  Lab 07/01/21 0515 07/02/21 0526 07/02/21 1319 07/03/21 0848 07/04/21 1014 07/05/21 0959 07/06/21 1043  NA  --    < > 138 132* 138 136 137  K  --    < > 4.4 4.0 4.1 3.7 4.3  CL  --    < > 102 98 105 101 102  CO2  --    < > 27 29 27 27 25   GLUCOSE  --    < > 68* 141* 87 124* 134*  BUN  --    < > 13 16 10 11 13   CREATININE  --    < > 0.86 1.08* 0.88 0.81 0.80  CALCIUM  --    < > 9.9 9.1 9.5 9.3 9.9  MG 2.2  --   --   --   --  1.9 1.9  PHOS  --   --   --   --   --  3.4 3.8   < > = values in this interval not displayed.    GFR: Estimated Creatinine Clearance: 79.1 mL/min (by C-G formula  based on SCr of 0.8 mg/dL). Liver Function Tests: Recent Labs  Lab 07/05/21 0959 07/06/21 1043  AST 19 22  ALT 24 26  ALKPHOS 57 58  BILITOT 0.4 0.4  PROT 6.1* 6.5  ALBUMIN 2.8* 3.1*    No results for input(s): LIPASE, AMYLASE in the last 168 hours. Recent Labs  Lab 07/03/21 1454  AMMONIA 38*    Coagulation Profile: No results for input(s): INR, PROTIME in the last 168 hours. Cardiac Enzymes: No results for input(s): CKTOTAL, CKMB, CKMBINDEX, TROPONINI in the last 168 hours. BNP (last 3 results) No results for input(s): PROBNP in the last 8760 hours. HbA1C: No results for input(s): HGBA1C in the last 72 hours. CBG: Recent Labs  Lab 07/05/21 1134 07/05/21 1649 07/05/21 2133 07/06/21 0601 07/06/21 1157  GLUCAP 119* 158* 143* 74 114*    Lipid Profile: No results for input(s): CHOL, HDL, LDLCALC, TRIG, CHOLHDL, LDLDIRECT in the last 72 hours. Thyroid Function Tests: No results for input(s): TSH, T4TOTAL, FREET4, T3FREE, THYROIDAB in the last 72 hours. Anemia Panel: No results for input(s): VITAMINB12, FOLATE, FERRITIN, TIBC, IRON, RETICCTPCT in the last 72 hours. Sepsis Labs: Recent Labs  Lab 07/03/21 0935 07/03/21 0947 07/03/21 1228  PROCALCITON  --  0.12  --   LATICACIDVEN 0.8  --  0.7     Recent Results (from the past 240 hour(s))  Culture, Urine     Status: None   Collection Time: 06/29/21  5:51 PM   Specimen: Urine, Random  Result Value Ref Range Status   Specimen Description URINE, RANDOM  Final   Special Requests NONE  Final   Culture   Final    NO GROWTH Performed at [facility], [address], [place], [place] [address]    Report Status 07/01/2021 FINAL  Final  Culture, Urine     Status: None   Collection Time: 07/03/21  9:14 AM   Specimen: Urine, Random  Result Value Ref Range Status   Specimen Description URINE, RANDOM  Final   Special Requests NONE  Final   Culture   Final    NO GROWTH Performed at [facility],  [address], [place], [place] [address]    Report Status 07/04/2021 FINAL  Final      RN Pressure Injury Documentation:     Estimated body mass index is 20.5 kg/m as calculated from the following:   Height as of this encounter: 5\' 6"  (1.676 m).   Weight as of this encounter: 57.6 kg.  Malnutrition Type:   Malnutrition Characteristics:   Nutrition Interventions:    Radiology Studies: DG Swallowing Func-Speech Pathology  Result Date: 07/05/2021 Formatting of this result is different from the original. Objective Swallowing Evaluation: Type of Study: MBS-Modified Barium Swallow Study  Patient Details Name: BOBETTE LEYH MRN: [id number] Date of Birth: 10-21-1971 Today's Date: 07/05/2021 Past Medical History: Past Medical History: Diagnosis Date  Arthralgia of temporomandibular joint   CAD, multiple vessel   a. 06/2016 Cath: ostLM 40%, ostLAD 40%, pLAD 95%, ost-pLCx 60%, pLCx 95%, mLCx 60%, mRCA 95%, D2 50%, LVSF nl;  b. 07/2016 CABG x 4 (LIMA->LAD, VG->Diag, VG->OM, VG->RCA); c. 08/2016 Cath: 3VD w/ 4/4 patent grafts. LAD distal to LIMA has diff dzs->Med rx; d. 08/2020 Cath: 4/4 patent grafts, native 3VD. EF 55-65%-->Med Rx.  Carotid arterial disease ([place])   a. 07/2016 s/p R CEA; b. 02/2021 U/S: RICA 19-14%, LICA 7-82%.  Clotting disorder ([place])   Depression   Diastolic dysfunction   a. 06/2016 Echo: EF 50-55%, mild inf wall HK, GR1DD, mild MR, RV sys fxn nl, mildly dilated LA, PASP nl  Fatty liver disease, nonalcoholic 9562  History of blood transfusion   with heart surgery  HLD (hyperlipidemia)   Labile hypertension   a. prior renal ngiogram negative for RAS in 03/2016; b. catecholamines and metanephrines normal, mildly elevated renin with normal aldosterone and normal ratio in 02/2016  Myocardial infarction [facility]) 2017  Obesity   PAD (peripheral artery disease) ([place])   a. 09/2018 s/p L SFA stenting; b. 07/2019 Periph Angio: Patent m/d L SFA stent w/ 100% L SFA distal to stent. L AT 100d, L Peroneal diff  dzs-->Med Rx; c. 02/2021 ABIs: stable @ 0.61 on R and 0.46 on L.  PTSD (post-traumatic stress disorder)   Tobacco abuse   Type 2 diabetes mellitus ([place]) 12/2015 Past Surgical History: Past Surgical History: Procedure Laterality Date  ABDOMINAL AORTOGRAM W/LOWER EXTREMITY [name] 10/15/2018  Procedure: ABDOMINAL AORTOGRAM W/LOWER EXTREMITY;  Surgeon: Wellington Hampshire, MD;  Location: [place] [facility];  Service: Cardiovascular;  Laterality: [name];  ABDOMINAL AORTOGRAM W/LOWER EXTREMITY Bilateral 08/19/2019  Procedure: ABDOMINAL AORTOGRAM W/LOWER EXTREMITY;  Surgeon: Wellington Hampshire, MD;  Location: [place] [facility];  Service: Cardiovascular;  Laterality: Bilateral;  CARDIAC CATHETERIZATION [name] 06/29/2016  Procedure: Left Heart Cath and Coronary Angiography;  Surgeon: Minna Merritts, MD;  Location: [place] [facility];  Service: Cardiovascular;  Laterality: [name];  CARDIAC CATHETERIZATION [name] 08/29/2016  Procedure: Left Heart Cath and Cors/Grafts Angiography;  Surgeon: Wellington Hampshire, MD;  Location: [place] [facility];  Service: Cardiovascular;  Laterality: [name];  CESAREAN SECTION    CHOLECYSTECTOMY    CORONARY ARTERY BYPASS GRAFT [name] 07/06/2016  Procedure: CORONARY ARTERY BYPASS GRAFTING (CABG) x four, using left internal  mammary artery and right leg greater saphenous vein harvested endoscopically;  Surgeon: Ivin Poot, MD;  Location: [place];  Service: Open Heart Surgery;  Laterality: [name];  ENDARTERECTOMY Right 07/06/2016  Procedure: ENDARTERECTOMY CAROTID;  Surgeon: Rosetta Posner, MD;  Location: [facility] OR;  Service: Vascular;  Laterality: Right;  ENDARTERECTOMY Right 04/27/2020  Procedure: REDO OF RIGHT ENDARTERECTOMY CAROTID;  Surgeon: Rosetta Posner, MD;  Location: [facility] OR;  Service: Vascular;  Laterality: Right;  LEFT HEART CATH AND CORS/GRAFTS ANGIOGRAPHY [name] 08/24/2020  Procedure: LEFT HEART CATH AND CORS/GRAFTS ANGIOGRAPHY;  Surgeon: Wellington Hampshire, MD;  Location: [place] [facility];  Service: Cardiovascular;  Laterality:  [name];  PERIPHERAL VASCULAR CATHETERIZATION [name] 04/18/2016  Procedure: Renal Angiography;  Surgeon: Wellington Hampshire, MD;  Location: [place] [facility];  Service: Cardiovascular;  Laterality: [name];  PERIPHERAL VASCULAR INTERVENTION Left 10/15/2018  Procedure: PERIPHERAL VASCULAR INTERVENTION;  Surgeon: Wellington Hampshire, MD;  Location: [place] [facility];  Service: Cardiovascular;  Laterality: Left;  Left superficial femoral  TEE WITHOUT CARDIOVERSION [name] 07/06/2016  Procedure: TRANSESOPHAGEAL ECHOCARDIOGRAM (TEE);  Surgeon: Ivin Poot, MD;  Location: [place];  Service: Open Heart Surgery;  Laterality: [name];  TONSILLECTOMY   HPI: See H&P  Subjective: pt awake, resting in bed in room. Husband present. Noted much improved Left orofacial weakness at rest. Wearing Dentures now. Assessment / Plan / Recommendation CHL IP CLINICAL IMPRESSIONS 07/05/2021 Clinical Impression Patient demonstrates a moderate oral and mild pharyngeal dysphagia. Patient's oral phase is characterized by decreased bolus cohesion with intermittent premature spillage with thin liquids. Patient consistently able to trigger her swallow at the level of the pyriform sinuses and was able to fully protect her airway with both sips via cup and straw. Impaired mastication with use of a munching pattern, piecemeal swallowing and oral stasis was noted with solid textures.  Recommend patient continue current diet of Dys. 2 textures with thin liquids. Patient reports increased comfort with use of cup sips, therefore, recommend no straws at this time as patient can demonstrate use of smaller sips with increased oral control and to maximize patient's comfort. Also recommend continuing strict aspiration precautions of out of bed for meals, limit distractions, check for oral stasis and meds whole in puree. Educated both the patient and her husband regarding results, both verbalize understanding. SLP Visit Diagnosis Dysphagia, oropharyngeal phase (R13.12) Attention and  concentration deficit following -- Frontal lobe and executive function deficit following -- Impact on safety and function Mild aspiration risk   CHL IP TREATMENT RECOMMENDATION 07/05/2021 Treatment Recommendations Therapy as outlined in treatment plan below   Prognosis 07/05/2021 Prognosis for Safe Diet Advancement Good Barriers to Reach Goals Cognitive deficits Barriers/Prognosis Comment -- CHL IP DIET RECOMMENDATION 07/05/2021 SLP Diet Recommendations Dysphagia 2 (Fine chop) solids;Thin liquid Liquid Administration via Cup;No straw Medication Administration Whole meds with puree Compensations Minimize environmental distractions;Small sips/bites;Slow rate;Lingual sweep for clearance of pocketing Postural Changes (No Data)   CHL IP OTHER RECOMMENDATIONS 07/05/2021 Recommended Consults -- Oral Care Recommendations Oral care BID Other Recommendations --   CHL IP FOLLOW UP RECOMMENDATIONS 07/05/2021 Follow up Recommendations Inpatient Rehab   CHL IP FREQUENCY AND DURATION 07/05/2021 Speech Therapy Frequency (ACUTE ONLY) min 3x week Treatment Duration 3 weeks      CHL IP ORAL PHASE 07/05/2021 Oral Phase Impaired Oral - Pudding Teaspoon -- Oral - Pudding Cup -- Oral - Honey Teaspoon -- Oral - Honey Cup -- Oral - Nectar Teaspoon -- Oral - Nectar Cup -- Oral -  Nectar Straw -- Oral - Thin Teaspoon -- Oral - Thin Cup Decreased bolus cohesion;Premature spillage;Weak lingual manipulation Oral - Thin Straw Decreased bolus cohesion;Premature spillage Oral - Puree WFL Oral - Mech Soft Impaired mastication;Decreased bolus cohesion;Piecemeal swallowing;Lingual pumping Oral - Regular -- Oral - Multi-Consistency -- Oral - Pill -- Oral Phase - Comment --  CHL IP PHARYNGEAL PHASE 07/05/2021 Pharyngeal Phase Impaired Pharyngeal- Pudding Teaspoon -- Pharyngeal -- Pharyngeal- Pudding Cup -- Pharyngeal -- Pharyngeal- Honey Teaspoon -- Pharyngeal -- Pharyngeal- Honey Cup -- Pharyngeal -- Pharyngeal- Nectar Teaspoon -- Pharyngeal -- Pharyngeal-  Nectar Cup -- Pharyngeal -- Pharyngeal- Nectar Straw -- Pharyngeal -- Pharyngeal- Thin Teaspoon -- Pharyngeal -- Pharyngeal- Thin Cup Penetration/Aspiration during swallow;Delayed swallow initiation-pyriform sinuses Pharyngeal Material enters airway, remains ABOVE vocal cords then ejected out Pharyngeal- Thin Straw Delayed swallow initiation-pyriform sinuses Pharyngeal Material does not enter airway Pharyngeal- Puree Delayed swallow initiation-vallecula Pharyngeal Material does not enter airway Pharyngeal- Mechanical Soft Delayed swallow initiation-vallecula Pharyngeal Material does not enter airway Pharyngeal- Regular -- Pharyngeal -- Pharyngeal- Multi-consistency -- Pharyngeal -- Pharyngeal- Pill -- Pharyngeal -- Pharyngeal Comment --  No flowsheet data found. PAYNE, [place] 07/05/2021, 12:31 PM    Weston Anna, MA, CCC-SLP            Scheduled Meds:  aspirin  81 mg Oral Daily   benztropine  0.5 mg Oral QHS   carvedilol  3.125 mg Oral BID WC   chlorproMAZINE  100 mg Oral QHS   clopidogrel  75 mg Oral Daily   cyanocobalamin  1,000 mcg Subcutaneous Q30 days   enoxaparin (LOVENOX) injection  40 mg Subcutaneous Q24H   ezetimibe  10 mg Oral Daily   FLUoxetine  60 mg Oral Daily   guaiFENesin  600 mg Oral BID   hydrocortisone cream   Topical BID   insulin aspart  0-5 Units Subcutaneous QHS   insulin aspart  0-9 Units Subcutaneous TID WC   insulin glargine  33 Units Subcutaneous BID   isosorbide mononitrate  60 mg Oral BID   lamoTRIgine  200 mg Oral QHS   lidocaine  1 patch Transdermal Q24H   losartan  25 mg Oral Daily   nicotine  21 mg Transdermal Daily   pantoprazole  40 mg Oral Q1200   pregabalin  225 mg Oral BID   rosuvastatin  20 mg Oral Daily   spironolactone  25 mg Oral Daily   thiamine  100 mg Oral Daily   torsemide  10 mg Oral Daily   traZODone  50 mg Oral QHS   Vitamin D (Ergocalciferol)  50,000 Units Oral Q7 days   Continuous Infusions:  sodium chloride 10 mL/hr at 07/06/21  1104   sodium chloride 75 mL/hr at 07/06/21 0832   ampicillin-sulbactam (UNASYN) IV 3 g (07/06/21 1106)    LOS: 8 days   Kerney Elbe, DO [facility] PAGER is on AMION  If 7PM-7AM, please contact night-coverage [email or web address]

## 2021-07-06 NOTE — Progress Notes (Signed)
Physical Therapy Weekly Progress Note  Patient Details  Name: Erika [name] MRN: [id number] Date of Birth: 1971/12/12  Beginning of progress report period: June 29, 2021 End of progress report period: July 06, 2021  Today's Date: 07/06/2021 PT Individual Time: 1101-1157 PT Individual Time Calculation (min): 56 min   Patient has met 2 of 4 short term goals. Pt making appropriate progress towards goals. She continues to be motivated and shows great participation with supportive husband at bedside. Pt is able to complete bed mobility with minA (with bed features), squat<>pivot transfers with modA, and has ambulated ~77ft with +2 assist using R hand rail and ~86ft with +3 assist using [name]. She continues to be primarily limited by L hemibody weakness (LUE > LLE), balance deficits, gait impairments, motor impersistence, and impaired muscle activation.   Patient continues to demonstrate the following deficits muscle weakness, decreased cardiorespiratoy endurance, impaired timing and sequencing and unbalanced muscle activation, decreased attention to left and decreased motor planning, decreased attention, decreased awareness, decreased problem solving, and decreased safety awareness, and decreased sitting balance, decreased standing balance, decreased postural control, hemiplegia, and decreased balance strategies and therefore will continue to benefit from skilled PT intervention to increase functional independence with mobility.  Patient progressing toward long term goals.  Continue plan of care.  PT Short Term Goals Week 1:  PT Short Term Goal 1 (Week 1): Pt will perform bed mobility with minA. PT Short Term Goal 1 - Progress (Week 1): Met PT Short Term Goal 2 (Week 1): Pt will perform sit to stand transfer with minA. PT Short Term Goal 2 - Progress (Week 1): Met PT Short Term Goal 3 (Week 1): Pt perform bed to chair transfer with minA. PT Short Term Goal 3 - Progress (Week 1): Not  met PT Short Term Goal 4 (Week 1): Pt will ambulate x25' with modA. PT Short Term Goal 4 - Progress (Week 1): Not met Week 2:  PT Short Term Goal 1 (Week 2): Pt will complete bed mobility with CGA PT Short Term Goal 2 (Week 2): Pt will complete bed<>chair transfers with minA PT Short Term Goal 3 (Week 2): Pt will ambulate 70ft with modA of 1 person and LRAD  Skilled Therapeutic Interventions/Progress Updates:    Pt received seated in w/c to start session, husband and RN at bedside. RN setting up PIV and requests that this remain on throughout session. Pt denies pain. W/c transport for time management to main rehab hallway to focus on NMR for gait traiing with use of R hand rail support. Ace wrapped LLE for DF assist and provided shoe cover for toe cap to reduce friction and improve swing. Husband provided assist for w/c follow and managing IV pole. Pt ambulated 55ft + 54ft + 82ft + 37ft with modA and +2 assist from husband. Improved hip flexor initiation in LLE for swing phase but still requires maxA for LLE management. Gait distances limited by both fatigue and RLE weakness (pt reports baseline PAD in LE's that impact her ability to ambulate due to pain). SpO2 assessed during rest breaks, reading >94% each bout while on RA. Next, completed squat<>pivot transfer with modA from w/c to mat table with L knee block and max cues for technique. Completed sit>supine with modA for LLE and trunk management. While supine, provided LLE heel cord stretch and LLE distal hamstring stretch to tolerance. Completed supine AAROM there-ex including heel slide/leg press, hip abduction, bridges, and hooklying "windshield wipers" 1x15 reps each. She  required maxA for supine<>sit from flat mat table, primarily for trunk elevation. Completed squat<>pivot transfer with modA back to her w/c and she was returned to her room at end of session. Her PIV became dislodged during final transfer, no bleeding noted and RN was notified after  session. Chair alarm set, LUE placed in arm trough, pt made comfortable. Husband remained at bedside.   Therapy Documentation Precautions:  Precautions Precautions: Fall Precaution Comments: L hemiplegia Restrictions Weight Bearing Restrictions: No LUE Weight Bearing: Weight bearing as tolerated LLE Weight Bearing: Weight bearing as tolerated General:    Therapy/Group: Individual Therapy  [name] 07/06/2021, 7:47 AM

## 2021-07-06 NOTE — Progress Notes (Signed)
Occupational Therapy Session Note  Patient Details  Name: Erika [name] MRN: [id number] Date of Birth: 1971-04-30  Today's Date: 07/06/2021 OT Individual Time: 9937-1696 OT Individual Time Calculation (min): 75 min    Short Term Goals: Week 1:  OT Short Term Goal 1 (Week 1): Pt will be able to complete toilet transfer squat pivot with min A. OT Short Term Goal 2 (Week 1): Pt will be able to stand with min A and pull pants over hips with min A. OT Short Term Goal 3 (Week 1): Pt will be able to don shirt with min A. OT Short Term Goal 4 (Week 1): Pt will be able to self mobilize LUE.  Skilled Therapeutic Interventions/Progress Updates:    Pt seen this session for a shower and dressing with a focus on squat pivot transfers and sit to stands.  Overall she is still requiring mod A with transfers as she is having a difficulty time raising her hips high enough and maintaining the lift for an adequate swivel.  But with sit to stands she is doing great at a min level.   In shower pt was able to wash all body parts (except for L side of bottom) using the long sponge.  Improved hemi techniques with dressing.   Applied saebo stim one for FES to  L forearm for wrist and finger extension and worked on hand over hand guiding at same time to faciliate grasp and release with cones.  After 25 min, moved estim to L deltoid for subluxation tx.  Cyclic unattended estim for 30 min.   Pt tolerated estim well but no active movement resulted.     Pt resting in wc with all needs met and spouse in the room.   Therapy Documentation Precautions:  Precautions Precautions: Fall Precaution Comments: L hemiplegia Restrictions Weight Bearing Restrictions: No LUE Weight Bearing: Weight bearing as tolerated LLE Weight Bearing: Weight bearing as tolerated  Pain: Pain Assessment Pain Scale: 0-10 Pain Score: 5  Pain Type: Chronic pain Pain Location: Head Pain Intervention(s): Medication (See  eMAR)   Therapy/Group: Individual Therapy  [place] 07/06/2021, 12:10 PM

## 2021-07-06 NOTE — Progress Notes (Signed)
Speech Language Pathology Weekly Progress and Session Note  Patient Details  Name: Erika [name] MRN: [id number] Date of Birth: 06-16-1971  Beginning of progress report period: June 28, 2021 End of progress report period: July 06, 2021  Today's Date: 07/06/2021 SLP Individual Time: 1315-1350 SLP Individual Time Calculation (min): 35 min  Short Term Goals: Week 1: SLP Short Term Goal 1 (Week 1): Patient will tolerate Dys 3 solids, thin liquids without overt s/s aspiration or penetration and with consistent following of swallow precautions (small sips via straw or cup). SLP Short Term Goal 1 - Progress (Week 1): Not met SLP Short Term Goal 2 (Week 1): Patient will tolerate trials of regular solids textures with minimal cues for clearing oral cavity and checking for left sided oral pocketing. SLP Short Term Goal 2 - Progress (Week 1): Not met SLP Short Term Goal 3 (Week 1): Patient will utilize speech strategies (slow down rate, overarticulate) in structured conversation with min-modA cues. SLP Short Term Goal 3 - Progress (Week 1): Met SLP Short Term Goal 4 (Week 1): Patient will perfrom mildly complex problem solving tasks in mildly distracting environments with modA cues to redirect. SLP Short Term Goal 4 - Progress (Week 1): Met SLP Short Term Goal 5 (Week 1): Patient will demonstrate daily recall of specific medical and therapeutic interventions (exercises, strategies, etc.) with minA cues. SLP Short Term Goal 5 - Progress (Week 1): Met    New Short Term Goals: Week 2: SLP Short Term Goal 1 (Week 2): Patient will consume current diet with minimal overt s/s of aspiration with Min verbal cues for use of swallowing compensatory strategies. SLP Short Term Goal 2 (Week 2): Patient will utilize speech intelligibility strategies at the sentencen level to achieve ~75% intelligibility with Min verbal cues. SLP Short Term Goal 3 (Week 2): Patient will demonstrate selective attention to  functional tasks in a mildly distracting enviornment for 30 minutes with Min A verbal cues for redirection. SLP Short Term Goal 4 (Week 2): Patient will demonstrate functional problem solving during mildly complex tasks with supervision verbal cues. SLP Short Term Goal 5 (Week 2): Patient will demonstrate recall of functional information as it relates to care and safety with supervision verbal cues.  Weekly Progress Updates: Patient has made functional and inconsistent gains and has met 3 of 5 STGs this reporting period. Currently, patient requires overall Min A verbal cues to complete functional and mildly complex tasks safely in regards to selective attention, recall of functional information and problem solving. Patient continues to demonstrate a moderately severe dysarthria characterized by imprecise consonants and an increased vocal intensity resulting in decreased speech intelligibility with Mod-Max A verbal cues for use of speech intelligibility strategies. Patient had an MBS this week due to concerns for aspiration. No aspiration noted but moderate oral impairments resulted in poor oral control, decreased bolus cohesion and decreased mastication. Patient was recommended to continue current diet of Dys. 2 textures with thin liquids via cup (no straw) with full supervision and out of bed for meals. Patient and family education ongoing. Patient would benefit from continued skilled SLP intervention to maximize her cognitive and swallowing function as well as her overall speech intelligibility prior to discharge.      Intensity: Minumum of 1-2 x/day, 30 to 90 minutes Frequency: 3 to 5 out of 7 days Duration/Length of Stay: 07/25/21 Treatment/Interventions: Cognitive remediation/compensation;Dysphagia/aspiration precaution training;Medication managment;Patient/family education;Functional tasks;Speech/Language facilitation;Internal/external aids;Cueing hierarchy;Environmental controls;Therapeutic  Activities   Daily Session  Skilled Therapeutic Interventions:  Skilled treatment session focused on dysarthria and dysphagia goals. SLP facilitated session by providing supervision verbal cues for recall of speech intelligibility strategies and overall Mod A verbal cues for use of strategies at the sentence level during an informal verbal task. Patient and her husband reported concern regarding removal of dentures at night. SLP passed on concerns to charge RN and placed a sign at [facility] requesting removal of dentures at night and to donn in the morning prior to breakfast. Also discuss current diet recommendations, appropriate textures and plan of care. Both verbalized understanding. Patient left upright in bed with alarm on and all needs within reach. Continue with current plan of care.     Pain No/Denies Pain   Therapy/Group: Individual Therapy  Asael Pann 07/06/2021, 6:45 AM

## 2021-07-06 NOTE — Progress Notes (Signed)
Patient requested PRN oxygen for cluster headache/migraine. Oxygen administered as ordered.

## 2021-07-07 LAB — COMPREHENSIVE METABOLIC PANEL
ALT: 24 U/L (ref 0–44)
AST: 20 U/L (ref 15–41)
Albumin: 3.2 g/dL — ABNORMAL LOW (ref 3.5–5.0)
Alkaline Phosphatase: 59 U/L (ref 38–126)
Anion gap: 8 (ref 5–15)
BUN: 10 mg/dL (ref 6–20)
CO2: 30 mmol/L (ref 22–32)
Calcium: 10.2 mg/dL (ref 8.9–10.3)
Chloride: 100 mmol/L (ref 98–111)
Creatinine, Ser: 0.79 mg/dL (ref 0.44–1.00)
GFR, Estimated: >60 mL/min (ref >60)
Glucose, Bld: 128 mg/dL — ABNORMAL HIGH (ref 70–99)
Potassium: 4.3 mmol/L (ref 3.5–5.1)
Sodium: 138 mmol/L (ref 135–145)
Total Bilirubin: 0.4 mg/dL (ref 0.3–1.2)
Total Protein: 6.8 g/dL (ref 6.5–8.1)

## 2021-07-07 LAB — CBC WITH DIFFERENTIAL/PLATELET
Abs Immature Granulocytes: 0.36 10*3/uL — ABNORMAL HIGH (ref 0.00–0.07)
Basophils Absolute: 0.1 10*3/uL (ref 0.0–0.1)
Basophils Relative: 0 %
Eosinophils Absolute: 0.3 10*3/uL (ref 0.0–0.5)
Eosinophils Relative: 3 %
HCT: 40.5 % (ref 36.0–46.0)
Hemoglobin: 13.1 g/dL (ref 12.0–15.0)
Immature Granulocytes: 3 %
Lymphocytes Relative: 20 %
Lymphs Abs: 2.5 10*3/uL (ref 0.7–4.0)
MCH: 30 pg (ref 26.0–34.0)
MCHC: 32.3 g/dL (ref 30.0–36.0)
MCV: 92.7 fL (ref 80.0–100.0)
Monocytes Absolute: 0.6 10*3/uL (ref 0.1–1.0)
Monocytes Relative: 5 %
Neutro Abs: 8.5 10*3/uL — ABNORMAL HIGH (ref 1.7–7.7)
Neutrophils Relative %: 69 %
Platelets: 397 10*3/uL (ref 150–400)
RBC: 4.37 MIL/uL (ref 3.87–5.11)
RDW: 13.7 % (ref 11.5–15.5)
WBC: 12.3 10*3/uL — ABNORMAL HIGH (ref 4.0–10.5)
nRBC: 0 % (ref 0.0–0.2)

## 2021-07-07 LAB — MAGNESIUM: Magnesium: 1.9 mg/dL (ref 1.7–2.4)

## 2021-07-07 LAB — GLUCOSE, CAPILLARY
Glucose-Capillary: 74 mg/dL (ref 70–99)
Glucose-Capillary: 140 mg/dL — ABNORMAL HIGH (ref 70–99)
Glucose-Capillary: 128 mg/dL — ABNORMAL HIGH (ref 70–99)
Glucose-Capillary: 170 mg/dL — ABNORMAL HIGH (ref 70–99)

## 2021-07-07 LAB — PHOSPHORUS: Phosphorus: 3.9 mg/dL (ref 2.5–4.6)

## 2021-07-07 MED ORDER — SENNOSIDES-DOCUSATE SODIUM 8.6-50 MG PO TABS
2.0000 | ORAL_TABLET | Freq: Two times a day (BID) | ORAL | Status: DC
  Administered 2021-07-07 – 2021-08-01 (×44): 2 via ORAL
  Filled 2021-07-07 (×52): qty 2

## 2021-07-07 MED ORDER — INSULIN GLARGINE 100 UNIT/ML ~~LOC~~ SOLN
30.0000 [IU] | Freq: Two times a day (BID) | SUBCUTANEOUS | Status: DC
  Administered 2021-07-07 – 2021-07-12 (×10): 30 [IU] via SUBCUTANEOUS
  Filled 2021-07-07 (×12): qty 0.3

## 2021-07-07 NOTE — Progress Notes (Signed)
PROGRESS NOTE   Subjective/Complaints:  No breathing issues, discussed CBGs, no pains   ROS: +left sided weakness, +depression, +anxiety, +cough, denies any more shortness of breath, +headache   Objective:   DG CHEST PORT 1 VIEW  Result Date: 07/06/2021 CLINICAL DATA:  Shortness of breath. EXAM: PORTABLE CHEST 1 VIEW COMPARISON:  07/03/2021. FINDINGS: Cardiomediastinal silhouette is unchanged with postsurgical changes and median sternotomy. The superior-most two median sternotomy wires are fracture, similar to prior. Similar versus mildly increased opacities at the right lung base. No visible pleural effusions or pneumothorax. No acute osseous abnormality. IMPRESSION: Similar versus mildly increased opacities at the right lung base, which most likely represent aspiration and/or pneumonia given findings on the recent CT chest. Electronically Signed   By: Margaretha Sheffield MD   On: 07/06/2021 12:39   DG Swallowing Func-Speech Pathology  Result Date: 07/05/2021 Formatting of this result is different from the original. Objective Swallowing Evaluation: Type of Study: MBS-Modified Barium Swallow Study  Patient Details Name: Erika [name] MRN: [id number] Date of Birth: September 26, 1971 Today's Date: 07/05/2021 Past Medical History: Past Medical History: Diagnosis Date  Arthralgia of temporomandibular joint   CAD, multiple vessel   a. 06/2016 Cath: ostLM 40%, ostLAD 40%, pLAD 95%, ost-pLCx 60%, pLCx 95%, mLCx 60%, mRCA 95%, D2 50%, LVSF nl;  b. 07/2016 CABG x 4 (LIMA->LAD, VG->Diag, VG->OM, VG->RCA); c. 08/2016 Cath: 3VD w/ 4/4 patent grafts. LAD distal to LIMA has diff dzs->Med rx; d. 08/2020 Cath: 4/4 patent grafts, native 3VD. EF 55-65%-->Med Rx.  Carotid arterial disease ([place])   a. 07/2016 s/p R CEA; b. 02/2021 U/S: RICA 62-70%, LICA 3-50%.  Clotting disorder ([place])   Depression   Diastolic dysfunction   a. 06/2016 Echo: EF 50-55%, mild inf wall HK, GR1DD,  mild MR, RV sys fxn nl, mildly dilated LA, PASP nl  Fatty liver disease, nonalcoholic 0938  History of blood transfusion   with heart surgery  HLD (hyperlipidemia)   Labile hypertension   a. prior renal ngiogram negative for RAS in 03/2016; b. catecholamines and metanephrines normal, mildly elevated renin with normal aldosterone and normal ratio in 02/2016  Myocardial infarction [facility]) 2017  Obesity   PAD (peripheral artery disease) ([place])   a. 09/2018 s/p L SFA stenting; b. 07/2019 Periph Angio: Patent m/d L SFA stent w/ 100% L SFA distal to stent. L AT 100d, L Peroneal diff dzs-->Med Rx; c. 02/2021 ABIs: stable @ 0.61 on R and 0.46 on L.  PTSD (post-traumatic stress disorder)   Tobacco abuse   Type 2 diabetes mellitus ([place]) 12/2015 Past Surgical History: Past Surgical History: Procedure Laterality Date  ABDOMINAL AORTOGRAM W/LOWER EXTREMITY [name] 10/15/2018  Procedure: ABDOMINAL AORTOGRAM W/LOWER EXTREMITY;  Surgeon: Wellington Hampshire, MD;  Location: [place] [facility];  Service: Cardiovascular;  Laterality: [name];  ABDOMINAL AORTOGRAM W/LOWER EXTREMITY Bilateral 08/19/2019  Procedure: ABDOMINAL AORTOGRAM W/LOWER EXTREMITY;  Surgeon: Wellington Hampshire, MD;  Location: [place] [facility];  Service: Cardiovascular;  Laterality: Bilateral;  CARDIAC CATHETERIZATION [name] 06/29/2016  Procedure: Left Heart Cath and Coronary Angiography;  Surgeon: Minna Merritts, MD;  Location: [place] [facility];  Service: Cardiovascular;  Laterality: [name];  CARDIAC CATHETERIZATION  [name] 08/29/2016  Procedure: Left Heart Cath and Cors/Grafts Angiography;  Surgeon: Wellington Hampshire, MD;  Location: [place] [facility];  Service: Cardiovascular;  Laterality: [name];  CESAREAN SECTION    CHOLECYSTECTOMY    CORONARY ARTERY BYPASS GRAFT [name] 07/06/2016  Procedure: CORONARY ARTERY BYPASS GRAFTING (CABG) x four, using left internal mammary artery and right leg greater saphenous vein harvested endoscopically;  Surgeon: Ivin Poot, MD;  Location: [place];  Service:  Open Heart Surgery;  Laterality: [name];  ENDARTERECTOMY Right 07/06/2016  Procedure: ENDARTERECTOMY CAROTID;  Surgeon: Rosetta Posner, MD;  Location: [facility] OR;  Service: Vascular;  Laterality: Right;  ENDARTERECTOMY Right 04/27/2020  Procedure: REDO OF RIGHT ENDARTERECTOMY CAROTID;  Surgeon: Rosetta Posner, MD;  Location: [facility] OR;  Service: Vascular;  Laterality: Right;  LEFT HEART CATH AND CORS/GRAFTS ANGIOGRAPHY [name] 08/24/2020  Procedure: LEFT HEART CATH AND CORS/GRAFTS ANGIOGRAPHY;  Surgeon: Wellington Hampshire, MD;  Location: [place] [facility];  Service: Cardiovascular;  Laterality: [name];  PERIPHERAL VASCULAR CATHETERIZATION [name] 04/18/2016  Procedure: Renal Angiography;  Surgeon: Wellington Hampshire, MD;  Location: [place] [facility];  Service: Cardiovascular;  Laterality: [name];  PERIPHERAL VASCULAR INTERVENTION Left 10/15/2018  Procedure: PERIPHERAL VASCULAR INTERVENTION;  Surgeon: Wellington Hampshire, MD;  Location: [place] [facility];  Service: Cardiovascular;  Laterality: Left;  Left superficial femoral  TEE WITHOUT CARDIOVERSION [name] 07/06/2016  Procedure: TRANSESOPHAGEAL ECHOCARDIOGRAM (TEE);  Surgeon: Ivin Poot, MD;  Location: [place];  Service: Open Heart Surgery;  Laterality: [name];  TONSILLECTOMY   HPI: See H&P  Subjective: pt awake, resting in bed in room. Husband present. Noted much improved Left orofacial weakness at rest. Wearing Dentures now. Assessment / Plan / Recommendation CHL IP CLINICAL IMPRESSIONS 07/05/2021 Clinical Impression Patient demonstrates a moderate oral and mild pharyngeal dysphagia. Patient's oral phase is characterized by decreased bolus cohesion with intermittent premature spillage with thin liquids. Patient consistently able to trigger her swallow at the level of the pyriform sinuses and was able to fully protect her airway with both sips via cup and straw. Impaired mastication with use of a munching pattern, piecemeal swallowing and oral stasis was noted with solid textures.  Recommend patient  continue current diet of Dys. 2 textures with thin liquids. Patient reports increased comfort with use of cup sips, therefore, recommend no straws at this time as patient can demonstrate use of smaller sips with increased oral control and to maximize patient's comfort. Also recommend continuing strict aspiration precautions of out of bed for meals, limit distractions, check for oral stasis and meds whole in puree. Educated both the patient and her husband regarding results, both verbalize understanding. SLP Visit Diagnosis Dysphagia, oropharyngeal phase (R13.12) Attention and concentration deficit following -- Frontal lobe and executive function deficit following -- Impact on safety and function Mild aspiration risk   CHL IP TREATMENT RECOMMENDATION 07/05/2021 Treatment Recommendations Therapy as outlined in treatment plan below   Prognosis 07/05/2021 Prognosis for Safe Diet Advancement Good Barriers to Reach Goals Cognitive deficits Barriers/Prognosis Comment -- CHL IP DIET RECOMMENDATION 07/05/2021 SLP Diet Recommendations Dysphagia 2 (Fine chop) solids;Thin liquid Liquid Administration via Cup;No straw Medication Administration Whole meds with puree Compensations Minimize environmental distractions;Small sips/bites;Slow rate;Lingual sweep for clearance of pocketing Postural Changes (No Data)   CHL IP OTHER RECOMMENDATIONS 07/05/2021 Recommended Consults -- Oral Care Recommendations Oral care BID Other Recommendations --   CHL IP FOLLOW UP RECOMMENDATIONS 07/05/2021 Follow up Recommendations Inpatient Rehab   CHL IP FREQUENCY AND DURATION 07/05/2021  Speech Therapy Frequency (ACUTE ONLY) min 3x week Treatment Duration 3 weeks      CHL IP ORAL PHASE 07/05/2021 Oral Phase Impaired Oral - Pudding Teaspoon -- Oral - Pudding Cup -- Oral - Honey Teaspoon -- Oral - Honey Cup -- Oral - Nectar Teaspoon -- Oral - Nectar Cup -- Oral - Nectar Straw -- Oral - Thin Teaspoon -- Oral - Thin Cup Decreased bolus cohesion;Premature  spillage;Weak lingual manipulation Oral - Thin Straw Decreased bolus cohesion;Premature spillage Oral - Puree WFL Oral - Mech Soft Impaired mastication;Decreased bolus cohesion;Piecemeal swallowing;Lingual pumping Oral - Regular -- Oral - Multi-Consistency -- Oral - Pill -- Oral Phase - Comment --  CHL IP PHARYNGEAL PHASE 07/05/2021 Pharyngeal Phase Impaired Pharyngeal- Pudding Teaspoon -- Pharyngeal -- Pharyngeal- Pudding Cup -- Pharyngeal -- Pharyngeal- Honey Teaspoon -- Pharyngeal -- Pharyngeal- Honey Cup -- Pharyngeal -- Pharyngeal- Nectar Teaspoon -- Pharyngeal -- Pharyngeal- Nectar Cup -- Pharyngeal -- Pharyngeal- Nectar Straw -- Pharyngeal -- Pharyngeal- Thin Teaspoon -- Pharyngeal -- Pharyngeal- Thin Cup Penetration/Aspiration during swallow;Delayed swallow initiation-pyriform sinuses Pharyngeal Material enters airway, remains ABOVE vocal cords then ejected out Pharyngeal- Thin Straw Delayed swallow initiation-pyriform sinuses Pharyngeal Material does not enter airway Pharyngeal- Puree Delayed swallow initiation-vallecula Pharyngeal Material does not enter airway Pharyngeal- Mechanical Soft Delayed swallow initiation-vallecula Pharyngeal Material does not enter airway Pharyngeal- Regular -- Pharyngeal -- Pharyngeal- Multi-consistency -- Pharyngeal -- Pharyngeal- Pill -- Pharyngeal -- Pharyngeal Comment --  No flowsheet data found. PAYNE, COURTNEY 07/05/2021, 12:31 PM    Weston Anna, MA, CCC-SLP           Recent Labs    07/05/21 0607 07/06/21 1043  WBC 12.5* 12.0*  HGB 12.2 12.4  HCT 36.8 37.9  PLT 354 405*    Recent Labs    07/05/21 0959 07/06/21 1043  NA 136 137  K 3.7 4.3  CL 101 102  CO2 27 25  GLUCOSE 124* 134*  BUN 11 13  CREATININE 0.81 0.80  CALCIUM 9.3 9.9     Intake/Output Summary (Last 24 hours) at 07/07/2021 0826 Last data filed at 07/06/2021 1805 Gross per 24 hour  Intake 560 ml  Output --  Net 560 ml         Physical Exam: Vital Signs Blood pressure (!)  119/56, pulse (!) 55, temperature 97.6 F (36.4 C), resp. rate 17, height 5\' 6"  (1.676 m), weight 57.6 kg, SpO2 94 %.  General: No acute distress Mood and affect are appropriate Heart: Regular rate and rhythm no rubs murmurs or extra sounds Lungs: Clear to auscultation, breathing unlabored, no rales or wheezes Abdomen: Positive bowel sounds, soft nontender to palpation, nondistended Extremities: No clubbing, cyanosis, or edema Skin: No evidence of breakdown, no evidence of rash    Psych: pleasant, flat affect Skin: intact Neurological:    Mental Status: She is alert and oriented to person, place, and time.    Comments: Left facial weakness with mild dysarthria. Able to answer orientation questions and follow commands without difficulty.  0/5 LUE and LLE 4/5 on Right side    Assessment/Plan: 1. Functional deficits which require 3+ hours per day of interdisciplinary therapy in a comprehensive inpatient rehab setting. Physiatrist is providing close team supervision and 24 hour management of active medical problems listed below. Physiatrist and rehab team continue to assess barriers to discharge/monitor patient progress toward functional and medical goals  Care Tool:  Bathing    Body parts bathed by patient: Chest, Abdomen, Front perineal area, Right upper leg, Left  upper leg, Face, Left arm, Right arm, Right lower leg, Left lower leg (with long sponge)   Body parts bathed by helper: Right arm, Buttocks, Right lower leg, Left lower leg     Bathing assist Assist Level: Minimal Assistance - Patient > 75%     Upper Body Dressing/Undressing Upper body dressing   What is the patient wearing?: Pull over shirt    Upper body assist Assist Level: Minimal Assistance - Patient > 75%    Lower Body Dressing/Undressing Lower body dressing      What is the patient wearing?: Pants     Lower body assist Assist for lower body dressing: Moderate Assistance - Patient 50 - 74%      Toileting Toileting    Toileting assist Assist for toileting: Moderate Assistance - Patient 50 - 74%     Transfers Chair/bed transfer  Transfers assist     Chair/bed transfer assist level: Moderate Assistance - Patient 50 - 74%     Locomotion Ambulation   Ambulation assist      Assist level: 2 helpers Assistive device: Hand held assist Max distance: 20'   Walk 10 feet activity   Assist     Assist level: 2 helpers Assistive device: Hand held assist   Walk 50 feet activity   Assist Walk 50 feet with 2 turns activity did not occur: Safety/medical concerns  Assist level: Dependent - Patient 0% Assistive device: Lite Gait    Walk 150 feet activity   Assist Walk 150 feet activity did not occur: Safety/medical concerns         Walk 10 feet on uneven surface  activity   Assist Walk 10 feet on uneven surfaces activity did not occur: Safety/medical concerns         Wheelchair     Assist Will patient use wheelchair at discharge?: No Type of Wheelchair: Manual    Wheelchair assist level: Minimal Assistance - Patient > 75% Max wheelchair distance: 135ft    Wheelchair 50 feet with 2 turns activity    Assist        Assist Level: Minimal Assistance - Patient > 75%   Wheelchair 150 feet activity     Assist          Blood pressure (!) 119/56, pulse (!) 55, temperature 97.6 F (36.4 C), resp. rate 17, height 5\' 6"  (1.676 m), weight 57.6 kg, SpO2 94 %.    Medical Problem List and Plan: 1.  Right pontine stroke             -patient may shower             -ELOS: 8/2             -husband may come in before 8am  Continue CIR PT, OT  2.  Impaired mobility: Continue Lovenox             -antiplatelet therapy: Continue ASA/Plavix 3. Cluster headaches and migraines: continue abortive oxygen PRN. Oxycodone prn for HA. Discussed outpatient Botox- patient would like to try.  4. Mood: Team to provide ego support. LCSW to follow for  evaluation and support.             -antipsychotic agents: Lamictal daily. 5. Neuropsych: This patient is capable of making decisions on her own behalf. 6. Skin/Wound Care: Routine pressure relief measures. 7. Fluids/Electrolytes/Nutrition: Monitor I/O. Check lytes in am. 8. HTN: SBP goal 140-180 range to avoid hypoperfusion of brain. Per husband "acts drunk when BP is  low" --Continue Imdur, Coreg, aldactone, Demadex, Per reports 7/14: BP below goal given stenosis: decrease coreg to 3.125mg  BID.  Vitals:   07/06/21 1951 07/07/21 0523  BP: 139/67 (!) 119/56  Pulse: (!) 54 (!) 55  Resp: 14 17  Temp: 98.1 F (36.7 C) 97.6 F (36.4 C)  SpO2: 98% 94%  Avoid hypoperfusion, TRH following BP management , neg fluid balance reduce diuretic -discussed with TRH 9. T2DM: Hgb A1C- 10.7. BS run 200's at home. Dietary education/reinforcement.             --Continue Lantus  45 units BID with SSI for elevated BS             --7/10: hypoglycemic to 58l, decrease Lantus to 35U BID CBG (last 3)  Recent Labs    07/06/21 1647 07/06/21 2024 07/07/21 0600  GLUCAP 118* 196* 74   CBG a little low will reduce lantus to 30U   10. CAD s/p CABG: On ASA, coreg, Cozaar, Demadex, ASA, 11. PAD w/claudication: Has been smoking 1 PPD --quit at admission. Continue Nicotine patch. --On lyrica BID to manage symptoms.   12. Aspiration PNA: CT reviewed, appreciate medicine consult, WBC up, perhaps new aspiration pneumonia and started on Unasyn IV 7/11.  13. Chronic cluster headaches, currently well controlled: Managed with thorazine as well as oxycodone and high flow oxygen prn. Decreased oxycodone to TID PRN and edited order to indicate it should be given after therapy as per husband's request- makes her too sedated to tolerate therapy.  14. H/o depression/anxiety: Continue Lamictal, Prozac with Klonopin prn. 15. Chronic insomnia: Managed with home dose trazodone. 16. Dyslipidemia: Trig- 636, direct LDL-99.6, HDL 29,  Chol 202. On Zetia--to start Vespc 17. Vitamin B 12 deficiency: B12- 169 (was WNL a year ago) 18. NASH/Fatty liver: LFTs reviewed and normalized 19. Dense left sided hemiplegia: [place] regarding NMES- started trials Friday. They have TENS unit at home.  20. Hypokalemia: supplement 38meq 7/7 and repeat BMP reviewed, K+ improved to 3.8. Resolved. D/c daily supplement.  21. Leukocytosis: trending upward potentially secondary to aspiration pneumonia, repeat today 22. Vitamin D deficiency: start ergocalciferol 50,000U weekly for 7 weeks.  23.  Hypoxia ,improved  was not on O2 at admission, has been treated for asp PNA with IV abx for several days and WBC are normalizing.  ? Fluid overload from IV abx, crackles at bases but recent ECHO looked ok   IM consulting 24. AKI: repeat BMP 7/14 nl 24. Disposition: Lives with husband and daughter (autistic but high functioning)- husband very involved and has already coordinated with his boss to take time off to care for her. Messaged April to schedule HFU/TC.    LOS: 9 days A FACE TO FACE EVALUATION WAS PERFORMED  Charlett Blake 07/07/2021, 8:26 AM

## 2021-07-07 NOTE — Progress Notes (Addendum)
Occupational Therapy Session Note  Patient Details  Name: Erika [name] MRN: [id number] Date of Birth: [birth year]  Today's Date: 07/07/2021 OT Individual Time: 7915-0569 ; and 1430-1511 OT Individual Time Calculation (min): 40 min; and 41 min    Short Term Goals: Week 1:  OT Short Term Goal 1 (Week 1): Pt will be able to complete toilet transfer squat pivot with min A. OT Short Term Goal 2 (Week 1): Pt will be able to stand with min A and pull pants over hips with min A. OT Short Term Goal 3 (Week 1): Pt will be able to don shirt with min A. OT Short Term Goal 4 (Week 1): Pt will be able to self mobilize LUE.   Skilled Therapeutic Interventions/Progress Updates:    First session:  Pt seated in w/c, no c/o pain, agreeable to OT session.  Pt transported to large gym with focus on neuro re-ed and strengthening of left shoulder/periscapular musculature.  Mirror positioned in front of pt for increased visual feedback and facilitate motor planning.  Assessed MMT LUE noting 2-/5 left shoulder elevators and retractors.  0/5 MMT all other muscles through extremity.  1:1 NMES applied to upper traps and rhomboids to strengthen and promote shoulder stability with multimodal cueing to maximize quality of movement.   Ratio 1:3 Rate 35 pps Waveform- Asymmetric Ramp 1.0 Pulse 300 Intensity- 3 Duration -   10 minutes each  Report of pain at the beginning of session :0/10 Report of pain at the end of session:0/10  No adverse reactions after treatment and is skin intact.    Pt completed AROM shoulder elevation and scap retraction in tandem with NMES activation.  Direct hand off to SPT at end of session.  Second Session: Pt seated in w/c reporting need to use toilet.  Pts husband present for caregiver education during functional transfers.  Pt completed squat pivot w/c to commode with therapist while providing verbal education and visual demo to husband on body mechanics and transfer  technique. Pt required mod assist.  Pt had continent episode of urine.  Pt completed sit<>stand with therapist needing min assist then husband return demonstrated assist technique including blocking of left knee and foot,needing close CGA by therapist to ensure safety. Pt required total assist to pull pants up and down over hips.  Pericare completed with CGA in seated position.  Squat pivot commode to w/c with mod assist (almost min assist).  Pt transported to EOB and completed squat pivot w/c to EOB requiring two attempts due to first attempt pushing/leaning incorrectly.  Pt reports feeling increased fatigue in LLE.  On second attempt, pt successfully shifted weight and required mod assist squat pivot to EOB.  Min assist sit to supine.  Call bell in reach at end of session.    Therapy Documentation Precautions:  Precautions Precautions: Fall Precaution Comments: L hemiplegia Restrictions Weight Bearing Restrictions: No LUE Weight Bearing: Weight bearing as tolerated LLE Weight Bearing: Weight bearing as tolerated    Therapy/Group: Individual Therapy  Ezekiel Slocumb 07/07/2021, 12:39 PM

## 2021-07-07 NOTE — Plan of Care (Signed)
  Problem: Consults Goal: RH STROKE PATIENT EDUCATION Description: See Patient Education module for education specifics  Outcome: Progressing   Problem: RH BOWEL ELIMINATION Goal: RH STG MANAGE BOWEL WITH ASSISTANCE Description: STG Manage Bowel with mod I Assistance. Outcome: Progressing Goal: RH STG MANAGE BOWEL W/MEDICATION W/ASSISTANCE Description: STG Manage Bowel with Medication with mod i Assistance. Outcome: Progressing   Problem: RH SAFETY Goal: RH STG ADHERE TO SAFETY PRECAUTIONS W/ASSISTANCE/DEVICE Description: STG Adhere to Safety Precautions With cues/reminders Assistance/Device. Outcome: Progressing   Problem: RH COGNITION-NURSING Goal: RH STG USES MEMORY AIDS/STRATEGIES W/ASSIST TO PROBLEM SOLVE Description: STG Uses Memory Aids/Strategies With cues/reminders Assistance to Problem Solve. Outcome: Progressing   Problem: RH PAIN MANAGEMENT Goal: RH STG PAIN MANAGED AT OR BELOW PT'S PAIN GOAL Description: At or below level 4 Outcome: Progressing   Problem: RH KNOWLEDGE DEFICIT Goal: RH STG INCREASE KNOWLEDGE OF DIABETES Description: Patient will be able to manage DM with medications and dietary modifications using handouts and educational tools independently Outcome: Progressing Goal: RH STG INCREASE KNOWLEDGE OF HYPERTENSION Description: Patient will be able to manage HTN with medications and dietary modifications using handouts and educational tools independently Outcome: Progressing Goal: RH STG INCREASE KNOWLEGDE OF HYPERLIPIDEMIA Description: Patient will be able to manage HLD with medications and dietary modifications using handouts and educational tools independently Outcome: Progressing Goal: RH STG INCREASE KNOWLEDGE OF STROKE PROPHYLAXIS Description: Patient will be able to manage secondary stroke risks with medications and dietary modifications using handouts and educational tools independently Outcome: Progressing   Problem: RH KNOWLEDGE  DEFICIT Goal: RH STG INCREASE KNOWLEDGE OF DYSPHAGIA/FLUID INTAKE Description: Patient will be able to manage Dysphagia , medications and dietary modifications using handouts and educational tools independently Outcome: Progressing

## 2021-07-07 NOTE — Progress Notes (Signed)
PROGRESS NOTE    ANELISSE JACOBSON  WYO:[id number] DOB: 05-Jan-1971 DOA: 06/28/2021 PCP: Lesleigh Noe, MD   Brief Narrative:  Patient is a 50 year old Caucasian female who was hospitalized from 06/24/21 until 06/28/2021 for acute CVA.  Her hospital course was complicated by hypoxia and sepsis due to aspiration event she is ultimately discharged to CIR.  While she was at [facility] noted to have further hypoxia and evaluation revealed recurrent aspiration pneumonia.  TRH was consulted by evaluation by the CIR MD. she has been placed on IV Unasyn and her leukocytosis is improving as well as her respiratory status.  She is no longer hypoxic and will need an ambulatory home O2 screen prior to discharge.  We will repeat chest x-ray today still pending to be done.  She will also have a repeat swallow study done and she has now been placed on a dysphagia 2 diet.  IVF Hydration now stopped.  Assessment & Plan:   Principal Problem:   Right pontine stroke ([place])  Acute Respiratory Failure with Hypoxia due to likely recurrent aspiration pneumonia -She was hypoxic but hypoxia is resolved -SpO2: 94 % O2 Flow Rate (L/min): 3 L/min; no longer wearing 3 L of supplemental oxygen via nasal cannula -Agree with IV Unasyn and will continue to Monitor Respiratory Status Carefully; Recommending Stopping IV Unasyn on 07/10/21 -She has now been placed on a Dysphagia 2 diet. -We will add flutter valve, incentive spirometry and change Robitussin-DM to the schedule guaifenesin 6 mg p.o. twice daily and leave the Robitussin-DM 5 to 10 mL every 6 hours as needed cough as necessary -Was Getting gentle IVF hydration with NS at 75 mL/hr but now stopped -WBC is trending down and went from 21.2 -> 13.3 -> 12.5 -> 12.0 -> 12.3 -Continue with albuterol 3 mL nebulized treatments every 4 hours as needed for wheezing or shortness of breath -Repeating CXR this AM and will order an Ambulatory Home O2 Screen  Recent CVA-with left-sided  hemiplegia/dysarthria PAD s/p Stenting  -Continue Aspirin 81 mg p.o. daily as well as Clopidogrel 75 mg p.o. daily per neurology recommendations -Continue rehabilitative efforts with Aggressive PT/OT    HLD -Continue Rosuvastatin 20 g p.o. daily as well as ezetimibe 10 mg p.o. daily  Tobacco Abuse -Smoking cessation counseling given -Continue nicotine patch 20 mg transdermally every 24 hours   HTN -BP stable but on the lower side at 119/56 -Continue Losartan 25 mg po Daily, Carvedilol 3.125 mg po BID, Isosorbide Mononitrate 60 mg po BID, Spironolactone 25 mg po Daily, -Agree with decreasing Torsemide to 10 mg po Daily    History of CAD-s/p CABG Chronic Diastolic Dsyfunction -No anginal symptoms -On dual antiplatelet and Statin as above -Also has NTG SL 0.4 mg q57min pRN [place] Pain -Continue to Monitor Volume Status carefully as she was getting IVF. Will stop IVF today   Depression/Anxiety/PTSD -C/w Benztropine 0.5 mg po qHS -Chlorpromazine 100 mg po qHS -C/w Clonazepam 0.5 mg po BIDprn Anxiety/Agitation/Panic -C/w Fluoxetine 60 mg po Daily  -C/w Lamtorigine 200 mg po qHS  Thrombocytosis -Likely Reactive and resolved -Platelet Count went from 354 -> 405 -> 397 -Continue to Monitor and Trend   GERD -C/w PPI with Pantoprazole 40 mg po Daily    DM-2 -CBGs seem adequately well controlled -Continue 33 units of Lantus BID and Sensitive Novolog SSI AC/HS -Continue to monitor CBG's per protocol; CBGs ranging from 74-196; Blood Sugar this AM on CMP was 128   Obesity -Weight loss and Dietary  Counseling given -BMI is not accurately documented  -Nutritionist involved and patient on a Dysphagia 2 Diet   DVT prophylaxis: Enoxaparin 40 mg sq q24h Code Status: FULL CODE Family Communication: Discussed with Husband at bedside  Disposition Plan: Per Primary Team  Consultants:  TRH  Procedures: None  Antimicrobials:  Anti-infectives (From admission, onward)    Start      Dose/Rate Route Frequency Ordered Stop   07/03/21 1930  Ampicillin-Sulbactam (UNASYN) 3 g in sodium chloride 0.9 % 100 mL IVPB        3 g 200 mL/hr over 30 Minutes Intravenous Every 6 hours 07/03/21 1838     07/03/21 0400  vancomycin (VANCOREADY) IVPB 750 mg/150 mL  Status:  Discontinued        750 mg 150 mL/hr over 60 Minutes Intravenous Every 12 hours 07/02/21 1426 07/03/21 0533   07/02/21 1600  ceFEPIme (MAXIPIME) 2 g in sodium chloride 0.9 % 100 mL IVPB  Status:  Discontinued        2 g 200 mL/hr over 30 Minutes Intravenous Every 8 hours 07/02/21 1353 07/03/21 0533   07/02/21 1400  vancomycin (VANCOREADY) IVPB 1250 mg/250 mL        1,250 mg 166.7 mL/hr over 90 Minutes Intravenous  Once 07/02/21 1312 07/02/21 1819   06/28/21 1500  Ampicillin-Sulbactam (UNASYN) 3 g in sodium chloride 0.9 % 100 mL IVPB  Status:  Discontinued        3 g 200 mL/hr over 30 Minutes Intravenous Every 6 hours 06/28/21 1321 07/02/21 1353        Subjective: Seen and examined at bedside and she was doing okay.  Thinks her respiratory status is doing much better.  Complained about diet and limitations.  No nausea or vomiting.  Denies any lightheadedness or dizziness.  No other concerns or complaint this time.   Objective: Vitals:   07/06/21 1247 07/06/21 1746 07/06/21 1951 07/07/21 0523  BP: 130/65  139/67 (!) 119/56  Pulse: (!) 57 62 (!) 54 (!) 55  Resp: 16  14 17   Temp: 98 F (36.7 C)  98.1 F (36.7 C) 97.6 F (36.4 C)  TempSrc: Oral  Oral   SpO2: 93%  98% 94%  Weight:      Height:        Intake/Output Summary (Last 24 hours) at 07/07/2021 1125 Last data filed at 07/07/2021 1114 Gross per 24 hour  Intake 1565.12 ml  Output --  Net 1565.12 ml    Filed Weights   06/28/21 1313  Weight: 57.6 kg   Examination: Physical Exam:  Constitutional: WN/WD obese Caucasian female in NAD and appears calm and comfortable sitting in the chair at bedside  Respiratory: Slightly diminished to auscultation  bilaterally, no wheezing, rales, rhonchi or crackles. Normal respiratory effort and patient is not tachypenic. No accessory muscle use. Unlabored breathing  Cardiovascular: RRR, no murmurs / rubs / gallops. S1 and S2 auscultated. Slight LE Edema Abdomen: Soft, non-tender, Distended 2/2 body habitus. Bowel sounds positive.  GU: Deferred. Musculoskeletal: No clubbing / cyanosis of digits/nails. Left arm in a brace Skin: No rashes, lesions, ulcers on a limited skin evaluation. No induration; Warm and dry.  Neurologic: CN 2-12 grossly intact with no focal deficits. Romberg sign and cerebellar reflexes not assessed.  Psychiatric: Normal judgment and insight. Alert and oriented x 3. Normal mood and appropriate affect.   Data Reviewed: I have personally reviewed following labs and imaging studies  CBC: Recent Labs  Lab 07/02/21 0526 07/02/21  1319 07/03/21 0848 07/04/21 1014 07/05/21 0607 07/06/21 1043 07/07/21 0951  WBC 11.2* 10.7* 21.2* 13.3* 12.5* 12.0* 12.3*  NEUTROABS 6.7 7.2  --  9.7*  --  8.5* 8.5*  HGB 12.6 13.3 12.0 12.2 12.2 12.4 13.1  HCT 38.4 40.5 37.2 37.8 36.8 37.9 40.5  MCV 91.9 92.7 93.0 92.2 92.0 91.8 92.7  PLT 355 385 380 376 354 405* 144    Basic Metabolic Panel: Recent Labs  Lab 07/01/21 0515 07/02/21 0526 07/03/21 0848 07/04/21 1014 07/05/21 0959 07/06/21 1043 07/07/21 0951  NA  --    < > 132* 138 136 137 138  K  --    < > 4.0 4.1 3.7 4.3 4.3  CL  --    < > 98 105 101 102 100  CO2  --    < > 29 27 27 25 30   GLUCOSE  --    < > 141* 87 124* 134* 128*  BUN  --    < > 16 10 11 13 10   CREATININE  --    < > 1.08* 0.88 0.81 0.80 0.79  CALCIUM  --    < > 9.1 9.5 9.3 9.9 10.2  MG 2.2  --   --   --  1.9 1.9 1.9  PHOS  --   --   --   --  3.4 3.8 3.9   < > = values in this interval not displayed.    GFR: Estimated Creatinine Clearance: 79.1 mL/min (by C-G formula based on SCr of 0.79 mg/dL). Liver Function Tests: Recent Labs  Lab 07/05/21 0959 07/06/21 1043  07/07/21 0951  AST 19 22 20   ALT 24 26 24   ALKPHOS 57 58 59  BILITOT 0.4 0.4 0.4  PROT 6.1* 6.5 6.8  ALBUMIN 2.8* 3.1* 3.2*    No results for input(s): LIPASE, AMYLASE in the last 168 hours. Recent Labs  Lab 07/03/21 1454  AMMONIA 38*    Coagulation Profile: No results for input(s): INR, PROTIME in the last 168 hours. Cardiac Enzymes: No results for input(s): CKTOTAL, CKMB, CKMBINDEX, TROPONINI in the last 168 hours. BNP (last 3 results) No results for input(s): PROBNP in the last 8760 hours. HbA1C: No results for input(s): HGBA1C in the last 72 hours. CBG: Recent Labs  Lab 07/06/21 0601 07/06/21 1157 07/06/21 1647 07/06/21 2024 07/07/21 0600  GLUCAP 74 114* 118* 196* 74    Lipid Profile: No results for input(s): CHOL, HDL, LDLCALC, TRIG, CHOLHDL, LDLDIRECT in the last 72 hours. Thyroid Function Tests: No results for input(s): TSH, T4TOTAL, FREET4, T3FREE, THYROIDAB in the last 72 hours. Anemia Panel: No results for input(s): VITAMINB12, FOLATE, FERRITIN, TIBC, IRON, RETICCTPCT in the last 72 hours. Sepsis Labs: Recent Labs  Lab 07/03/21 0935 07/03/21 0947 07/03/21 1228  PROCALCITON  --  0.12  --   LATICACIDVEN 0.8  --  0.7     Recent Results (from the past 240 hour(s))  Culture, Urine     Status: None   Collection Time: 06/29/21  5:51 PM   Specimen: Urine, Random  Result Value Ref Range Status   Specimen Description URINE, RANDOM  Final   Special Requests NONE  Final   Culture   Final    NO GROWTH Performed at [facility], [address] [place], [place] [address]    Report Status 07/01/2021 FINAL  Final  Culture, Urine     Status: None   Collection Time: 07/03/21  9:14 AM   Specimen:  Urine, Random  Result Value Ref Range Status   Specimen Description URINE, RANDOM  Final   Special Requests NONE  Final   Culture   Final    NO GROWTH Performed at [facility], [address] [place], [place] [address]    Report Status  07/04/2021 FINAL  Final      RN Pressure Injury Documentation:     Estimated body mass index is 20.5 kg/m as calculated from the following:   Height as of this encounter: 5\' 6"  (1.676 m).   Weight as of this encounter: 57.6 kg.  Malnutrition Type:   Malnutrition Characteristics:   Nutrition Interventions:    Radiology Studies: DG CHEST PORT 1 VIEW  Result Date: 07/06/2021 CLINICAL DATA:  Shortness of breath. EXAM: PORTABLE CHEST 1 VIEW COMPARISON:  07/03/2021. FINDINGS: Cardiomediastinal silhouette is unchanged with postsurgical changes and median sternotomy. The superior-most two median sternotomy wires are fracture, similar to prior. Similar versus mildly increased opacities at the right lung base. No visible pleural effusions or pneumothorax. No acute osseous abnormality. IMPRESSION: Similar versus mildly increased opacities at the right lung base, which most likely represent aspiration and/or pneumonia given findings on the recent CT chest. Electronically Signed   By: Margaretha Sheffield MD   On: 07/06/2021 12:39    Scheduled Meds:  aspirin  81 mg Oral Daily   benztropine  0.5 mg Oral QHS   carvedilol  3.125 mg Oral BID WC   chlorproMAZINE  100 mg Oral QHS   clopidogrel  75 mg Oral Daily   cyanocobalamin  1,000 mcg Subcutaneous Q30 days   enoxaparin (LOVENOX) injection  40 mg Subcutaneous Q24H   ezetimibe  10 mg Oral Daily   FLUoxetine  60 mg Oral Daily   guaiFENesin  600 mg Oral BID   hydrocortisone cream   Topical BID   insulin aspart  0-5 Units Subcutaneous QHS   insulin aspart  0-9 Units Subcutaneous TID WC   insulin glargine  30 Units Subcutaneous BID   isosorbide mononitrate  60 mg Oral BID   lamoTRIgine  200 mg Oral QHS   lidocaine  1 patch Transdermal Q24H   losartan  25 mg Oral Daily   nicotine  21 mg Transdermal Daily   pantoprazole  40 mg Oral Q1200   polyethylene glycol  17 g Oral Daily   pregabalin  225 mg Oral BID   rosuvastatin  20 mg Oral Daily    senna-docusate  2 tablet Oral BID   sodium chloride flush  10-40 mL Intracatheter Q12H   spironolactone  25 mg Oral Daily   thiamine  100 mg Oral Daily   torsemide  10 mg Oral Daily   traZODone  50 mg Oral QHS   Vitamin D (Ergocalciferol)  50,000 Units Oral Q7 days   Continuous Infusions:  sodium chloride Stopped (07/06/21 1606)   ampicillin-sulbactam (UNASYN) IV Stopped (07/07/21 1114)    LOS: 9 days   [place], DO [facility] PAGER is on AMION  If 7PM-7AM, please contact night-coverage [email or web address]

## 2021-07-07 NOTE — Progress Notes (Signed)
Speech Language Pathology Daily Session Note  Patient Details  Name: Erika [name] MRN: [id number] Date of Birth: August 01, 1971  Today's Date: 07/07/2021 SLP Individual Time: 7357-8978 SLP Individual Time Calculation (min): 30 min  Short Term Goals: Week 2: SLP Short Term Goal 1 (Week 2): Patient will consume current diet with minimal overt s/s of aspiration with Min verbal cues for use of swallowing compensatory strategies. SLP Short Term Goal 2 (Week 2): Patient will utilize speech intelligibility strategies at the sentencen level to achieve ~75% intelligibility with Min verbal cues. SLP Short Term Goal 3 (Week 2): Patient will demonstrate selective attention to functional tasks in a mildly distracting enviornment for 30 minutes with Min A verbal cues for redirection. SLP Short Term Goal 4 (Week 2): Patient will demonstrate functional problem solving during mildly complex tasks with supervision verbal cues. SLP Short Term Goal 5 (Week 2): Patient will demonstrate recall of functional information as it relates to care and safety with supervision verbal cues.  Skilled Therapeutic Interventions: Skilled treatment session focused on dysphagia and cognitive goals. SLP facilitated session by providing skilled observation with lunch meal of Dys. 2 textures with thin liquids. Patient consumed meal without overt s/s of aspiration and required intermittent supervision level verbal cues for use of swallowing compensatory strategies. Patient also demonstrated improvement in rotary chew with increased mastication of bolus. Recommend patient continue current diet. Patient mildly verbose throughout session but was easily redirected with supervision verbal cues. Of note, there continue to be issues with limited options for Dys. 2 textures and inconsistent presentation of food. Multiple staff members are addressing. Patient left upright in bed with alarm on and all needs within reach. Continue with current plan  of care.      Pain No/Denies Pain   Therapy/Group: Individual Therapy  Krystalyn Kubota, [place] 07/07/2021, 1:56 PM

## 2021-07-07 NOTE — Progress Notes (Signed)
Pt Physical Therapy Session Note  Patient Details  Name: Erika [name] MRN: [id number] Date of Birth: Feb 01, 1971  Today's Date: 07/07/2021 PT Individual Time: 0900-1000 + 1300-1426 PT Individual Time Calculation (min): 60 min  + 56 min  Short Term Goals: Week 2:  PT Short Term Goal 1 (Week 2): Pt will complete bed mobility with CGA PT Short Term Goal 2 (Week 2): Pt will complete bed<>chair transfers with minA PT Short Term Goal 3 (Week 2): Pt will ambulate 1f with modA of 1 person and LRAD  Skilled Therapeutic Interventions/Progress Updates:     1st session: Pt greeted seated in recliner to start session, agreeable to PT tx - no reports of pain. Husband at bedside and reports he assisted pt getting dressed and ready for the day. Pt completed stand<>pivot transfer with mod/maxA from recliner to w/c with assist for blocking L knee and positioning LLE for safety. Pt then transported in w/c to ortho rehab gym with [place] time management. Worked on [place] BITS system while standing, requiring modA for balance and L knee block while completing. Worked on user paced targeted reaching with RUE for x3 minutes, trail making #1-#25 for 2.560m, & Maze Test for x2.5 minutes - pt required maxA for balance while reaching towards paretic L side with her RUE - required reminders and cues for safety awareness and focusing more on balance rather than task. Next, worked on w/c propulsion with hemi technique - required modA for powering strokes and minA for guiding straight path - impaired ability to isolate RUE vs RLE for strokes. Focused remainder of session on gait training with modA and +2 assist for stabilizing EvEthelene HalAmbulated [id number] Eva Ethelene Hallaced L shoe cover over toe cap to reduce friction and improve LLE swing. Requires modA for advancing L limb into swing and assist at the pelvis for stabilizing and rotation forwards. Pt pleased with progress! She was returned to her room at  end of session and remained seated in w/c with chair alarm on and LUE placed in trough. Husband at bedside who was updated on pt's mobility and status/progressions.  2nd session: Pt received sitting in w/c with husband at bedside - pt agreeable to PT tx without reports of pain. W/c transport for time management to day room rehab gym. Focused first part of session on blocked practice squat<>pivot transfers from w/c<>mat table. She initially required modA but progressed to minA! Completed several repetitions of these with cues/instruction for anterior weight shift and assist for guiding hips for translation. Next, worked on repeated sit<>stands with RUE support to back of chair and assist for L knee stabilization - she initially required modA but progressed to minA after several reps! Next, worked on LLE stance phase and completed RLE toe-taps to yoga block with modA for balance and RUE support to back of chair - TC for quad facilitation. Next, worked on statRisk analystnding with game of cornhole with minA for standing balance as she tossed bags with RUE - working on pelvic rotation, LLE stance phase, and standing balance. Completed squat<>pivot with minA back to her w/c from mat table and wheeled to Nustep where she required modA for squat<>pivot transfer. Completed 2 + 2 + 2 minutes of Nustep at workload 4, using BLE and RUE, working on alternating reciprocal movement patterns and global strengthening. Squat<>pivot with modA back to her w/c and returned to her room for energy conservation. Husband at bedside and requesting pt remain seated in  chair for upcoming OT session. Chair alarm on and all needs met at end of session.  Therapy Documentation Precautions:  Precautions Precautions: Fall Precaution Comments: L hemiplegia Restrictions Weight Bearing Restrictions: No LUE Weight Bearing: Weight bearing as tolerated LLE Weight Bearing: Weight bearing as tolerated General:     Therapy/Group:  Individual Therapy  [name] 07/07/2021, 7:37 AM

## 2021-07-08 ENCOUNTER — Inpatient Hospital Stay (HOSPITAL_COMMUNITY): Payer: Managed Care, Other (non HMO)

## 2021-07-08 LAB — COMPREHENSIVE METABOLIC PANEL
ALT: 23 U/L (ref 0–44)
AST: 22 U/L (ref 15–41)
Albumin: 3.2 g/dL — ABNORMAL LOW (ref 3.5–5.0)
Alkaline Phosphatase: 55 U/L (ref 38–126)
Anion gap: 8 (ref 5–15)
BUN: 12 mg/dL (ref 6–20)
CO2: 30 mmol/L (ref 22–32)
Calcium: 10 mg/dL (ref 8.9–10.3)
Chloride: 98 mmol/L (ref 98–111)
Creatinine, Ser: 0.86 mg/dL (ref 0.44–1.00)
GFR, Estimated: >60 mL/min (ref >60)
Glucose, Bld: 175 mg/dL — ABNORMAL HIGH (ref 70–99)
Potassium: 4.2 mmol/L (ref 3.5–5.1)
Sodium: 136 mmol/L (ref 135–145)
Total Bilirubin: 0.6 mg/dL (ref 0.3–1.2)
Total Protein: 6.8 g/dL (ref 6.5–8.1)

## 2021-07-08 LAB — CBC WITH DIFFERENTIAL/PLATELET
Abs Immature Granulocytes: 0.33 10*3/uL — ABNORMAL HIGH (ref 0.00–0.07)
Basophils Absolute: 0.1 10*3/uL (ref 0.0–0.1)
Basophils Relative: 1 %
Eosinophils Absolute: 0.3 10*3/uL (ref 0.0–0.5)
Eosinophils Relative: 3 %
HCT: 39.5 % (ref 36.0–46.0)
Hemoglobin: 13 g/dL (ref 12.0–15.0)
Immature Granulocytes: 3 %
Lymphocytes Relative: 19 %
Lymphs Abs: 2.5 10*3/uL (ref 0.7–4.0)
MCH: 30.2 pg (ref 26.0–34.0)
MCHC: 32.9 g/dL (ref 30.0–36.0)
MCV: 91.9 fL (ref 80.0–100.0)
Monocytes Absolute: 0.5 10*3/uL (ref 0.1–1.0)
Monocytes Relative: 4 %
Neutro Abs: 9.5 10*3/uL — ABNORMAL HIGH (ref 1.7–7.7)
Neutrophils Relative %: 70 %
Platelets: 383 10*3/uL (ref 150–400)
RBC: 4.3 MIL/uL (ref 3.87–5.11)
RDW: 13.7 % (ref 11.5–15.5)
WBC: 13.3 10*3/uL — ABNORMAL HIGH (ref 4.0–10.5)
nRBC: 0 % (ref 0.0–0.2)

## 2021-07-08 LAB — PHOSPHORUS: Phosphorus: 4.3 mg/dL (ref 2.5–4.6)

## 2021-07-08 LAB — GLUCOSE, CAPILLARY
Glucose-Capillary: 88 mg/dL (ref 70–99)
Glucose-Capillary: 206 mg/dL — ABNORMAL HIGH (ref 70–99)
Glucose-Capillary: 132 mg/dL — ABNORMAL HIGH (ref 70–99)
Glucose-Capillary: 123 mg/dL — ABNORMAL HIGH (ref 70–99)

## 2021-07-08 LAB — MAGNESIUM: Magnesium: 1.8 mg/dL (ref 1.7–2.4)

## 2021-07-08 MED ORDER — SORBITOL 70 % SOLN
60.0000 mL | Freq: Once | Status: AC
  Administered 2021-07-08: 60 mL via ORAL
  Filled 2021-07-08: qty 60

## 2021-07-08 MED ORDER — MAGNESIUM OXIDE -MG SUPPLEMENT 400 (240 MG) MG PO TABS
400.0000 mg | ORAL_TABLET | Freq: Two times a day (BID) | ORAL | Status: AC
  Administered 2021-07-08 (×2): 400 mg via ORAL
  Filled 2021-07-08 (×2): qty 1

## 2021-07-08 MED ORDER — CYPROHEPTADINE HCL 4 MG PO TABS
4.0000 mg | ORAL_TABLET | Freq: Every day | ORAL | Status: DC | PRN
  Administered 2021-07-08 (×2): 4 mg via ORAL
  Filled 2021-07-08 (×3): qty 1

## 2021-07-08 MED ORDER — PROMETHAZINE HCL 12.5 MG PO TABS
12.5000 mg | ORAL_TABLET | Freq: Four times a day (QID) | ORAL | Status: DC | PRN
  Filled 2021-07-08: qty 1

## 2021-07-08 NOTE — Plan of Care (Signed)
  Problem: Consults Goal: RH STROKE PATIENT EDUCATION Description: See Patient Education module for education specifics  Outcome: Progressing   Problem: RH BOWEL ELIMINATION Goal: RH STG MANAGE BOWEL WITH ASSISTANCE Description: STG Manage Bowel with mod I Assistance. Outcome: Progressing Goal: RH STG MANAGE BOWEL W/MEDICATION W/ASSISTANCE Description: STG Manage Bowel with Medication with mod i Assistance. Outcome: Progressing   Problem: RH SAFETY Goal: RH STG ADHERE TO SAFETY PRECAUTIONS W/ASSISTANCE/DEVICE Description: STG Adhere to Safety Precautions With cues/reminders Assistance/Device. Outcome: Progressing   Problem: RH COGNITION-NURSING Goal: RH STG USES MEMORY AIDS/STRATEGIES W/ASSIST TO PROBLEM SOLVE Description: STG Uses Memory Aids/Strategies With cues/reminders Assistance to Problem Solve. Outcome: Progressing   Problem: RH PAIN MANAGEMENT Goal: RH STG PAIN MANAGED AT OR BELOW PT'S PAIN GOAL Description: At or below level 4 Outcome: Progressing   Problem: RH KNOWLEDGE DEFICIT Goal: RH STG INCREASE KNOWLEDGE OF DIABETES Description: Patient will be able to manage DM with medications and dietary modifications using handouts and educational tools independently Outcome: Progressing Goal: RH STG INCREASE KNOWLEDGE OF HYPERTENSION Description: Patient will be able to manage HTN with medications and dietary modifications using handouts and educational tools independently Outcome: Progressing Goal: RH STG INCREASE KNOWLEGDE OF HYPERLIPIDEMIA Description: Patient will be able to manage HLD with medications and dietary modifications using handouts and educational tools independently Outcome: Progressing Goal: RH STG INCREASE KNOWLEDGE OF STROKE PROPHYLAXIS Description: Patient will be able to manage secondary stroke risks with medications and dietary modifications using handouts and educational tools independently Outcome: Progressing   Problem: RH KNOWLEDGE  DEFICIT Goal: RH STG INCREASE KNOWLEDGE OF DYSPHAGIA/FLUID INTAKE Description: Patient will be able to manage Dysphagia , medications and dietary modifications using handouts and educational tools independently Outcome: Progressing

## 2021-07-08 NOTE — Progress Notes (Signed)
PROGRESS NOTE    Erika [name]  LZJ:[id number] DOB: 30-Apr-1971 DOA: 06/28/2021 PCP: Lesleigh Noe, MD   Brief Narrative:  Patient is a 50 year old Caucasian female who was hospitalized from 06/24/21 until 06/28/2021 for acute CVA.  Her hospital course was complicated by hypoxia and sepsis due to aspiration event she is ultimately discharged to CIR.  While she was at [facility] noted to have further hypoxia and evaluation revealed recurrent aspiration pneumonia.  TRH was consulted by evaluation by the CIR MD. she has been placed on IV Unasyn and her leukocytosis is improving as well as her respiratory status.  She is no longer hypoxic and will need an ambulatory home O2 screen prior to discharge.  We will repeat chest x-ray today still pending to be done.  She will also have a repeat swallow study done and she has now been placed on a dysphagia 2 diet.  IVF Hydration now stopped. WBC is slowly trending up so will continue to Monitor Closely  Assessment & Plan:   Principal Problem:   Right pontine stroke (Rachel)  Acute Respiratory Failure with Hypoxia due to likely recurrent aspiration pneumonia -She was hypoxic but hypoxia is resolved -SpO2: 93 % O2 Flow Rate (L/min): 3 L/min; no longer wearing 3 L of supplemental oxygen via nasal cannula and saturating well on ROOM Air -Agree with IV Unasyn and will continue to Monitor Respiratory Status Carefully; Recommending Stopping IV Unasyn on 07/10/21 -She has now been placed on a Dysphagia 2 diet. -We will add flutter valve, incentive spirometry and change Robitussin-DM to the schedule guaifenesin 6 mg p.o. twice daily and leave the Robitussin-DM 5 to 10 mL every 6 hours as needed cough as necessary -Was Getting gentle IVF hydration with NS at 75 mL/hr but now stopped -WBC was trending down and went from 21.2 -> 13.3 -> 12.5 -> 12.0. Now is trending up slolwy and went from 12.0 -> 12.3 -> 13.3 but Respiratory Symptoms have improved -Continue with albuterol 3  mL nebulized treatments every 4 hours as needed for wheezing or shortness of breath -Repeat CXR this AM showed "Regressed but not fully resolved streaky opacity at the right lung base since 07/03/2021 compatible with regression of infection and/or atelectasis. No new cardiopulmonary abnormality." -Will need an Ambulatory Home O2 Screen prior to Discharging   Recent CVA-with left-sided hemiplegia/dysarthria PAD s/p Stenting  -Continue Aspirin 81 mg p.o. daily as well as Clopidogrel 75 mg p.o. daily per neurology recommendations -Continue rehabilitative efforts with Aggressive PT/OT   Constipation -Patient has not had a bowel movement in 3 Days -C/w Bisacodyl 10 mg RC Dailyprn Moderate Constipation and Miralax 17 grams po Daily and 17 gram Dailyprn Mild Constipation; Also has Sorbitol 70% solution 30 mL and was ordered Sorbitol 70$ solution 25 mg po Daily -Also has Senna-Docusate 2 tab po BID   HLD -Continue Rosuvastatin 20 g p.o. daily as well as ezetimibe 10 mg p.o. daily  Tobacco Abuse -Smoking cessation counseling given -Continue nicotine patch 20 mg transdermally every 24 hours   HTN -BP stable but on the lower side at 119/56 yesterday and today is 134/63 -Continue Losartan 25 mg po Daily, Carvedilol 3.125 mg po BID, Isosorbide Mononitrate 60 mg po BID, Spironolactone 25 mg po Daily, -Agree with decreasing Torsemide to 10 mg po Daily    History of CAD-s/p CABG Chronic Diastolic Dsyfunction -No anginal symptoms -On dual antiplatelet and Statin as above -Also has NTG SL 0.4 mg q45min pRN [place] [facility] -Continue to  Monitor Volume Status carefully as she was getting IVF. Will stop IVF today   Depression/Anxiety/PTSD -C/w Benztropine 0.5 mg po qHS -Chlorpromazine 100 mg po qHS -C/w Clonazepam 0.5 mg po BIDprn Anxiety/Agitation/Panic -C/w Fluoxetine 60 mg po Daily  -C/w Lamtorigine 200 mg po qHS  Thrombocytosis -Likely Reactive and resolved -Platelet Count went from 354 -> 405 ->  397 -> 383 -Continue to Monitor and Trend   GERD -C/w PPI with Pantoprazole 40 mg po Daily    DM-2 -CBGs seem adequately well controlled -Continue 33 units of Lantus BID and Sensitive Novolog SSI AC/HS -Continue to monitor CBG's per protocol; CBGs ranging from 88-170; Blood Sugar this AM on CMP was 175   Obesity -Weight loss and Dietary Counseling given -BMI is not accurately documented  -Nutritionist involved and patient on a Dysphagia 2 Diet   DVT prophylaxis: Enoxaparin 40 mg sq q24h Code Status: FULL CODE Family Communication: Discussed with Husband at bedside  Disposition Plan: Per Primary Team  Consultants:  TRH  Procedures: None  Antimicrobials:  Anti-infectives (From admission, onward)    Start     Dose/Rate Route Frequency Ordered Stop   07/03/21 1930  Ampicillin-Sulbactam (UNASYN) 3 g in sodium chloride 0.9 % 100 mL IVPB        3 g 200 mL/hr over 30 Minutes Intravenous Every 6 hours 07/03/21 1838     07/03/21 0400  vancomycin (VANCOREADY) IVPB 750 mg/150 mL  Status:  Discontinued        750 mg 150 mL/hr over 60 Minutes Intravenous Every 12 hours 07/02/21 1426 07/03/21 0533   07/02/21 1600  ceFEPIme (MAXIPIME) 2 g in sodium chloride 0.9 % 100 mL IVPB  Status:  Discontinued        2 g 200 mL/hr over 30 Minutes Intravenous Every 8 hours 07/02/21 1353 07/03/21 0533   07/02/21 1400  vancomycin (VANCOREADY) IVPB 1250 mg/250 mL        1,250 mg 166.7 mL/hr over 90 Minutes Intravenous  Once 07/02/21 1312 07/02/21 1819   06/28/21 1500  Ampicillin-Sulbactam (UNASYN) 3 g in sodium chloride 0.9 % 100 mL IVPB  Status:  Discontinued        3 g 200 mL/hr over 30 Minutes Intravenous Every 6 hours 06/28/21 1321 07/02/21 1353        Subjective: Seen and examined at bedside and she was doing well and had no complaints.  Has not had a bowel movement in 3 days.  Denies any chest pain or shortness of breath from a respiratory standpoint she thinks that she is doing much better.   No other concerns or complaints at this time and happy she is going outside today.  Objective: Vitals:   07/07/21 0523 07/07/21 1255 07/07/21 1922 07/08/21 0415  BP: (!) 119/56 115/60 (!) 139/56 134/63  Pulse: (!) 55 (!) 58 (!) 56 (!) 54  Resp: 17 16 18 16   Temp: 97.6 F (36.4 C) 98.2 F (36.8 C) 98.4 F (36.9 C) 97.9 F (36.6 C)  TempSrc:  Oral Oral Oral  SpO2: 94% 94% 93% 93%  Weight:      Height:        Intake/Output Summary (Last 24 hours) at 07/08/2021 1125 Last data filed at 07/08/2021 0756 Gross per 24 hour  Intake 258 ml  Output --  Net 258 ml    Filed Weights   06/28/21 1313  Weight: 57.6 kg   Examination: Physical Exam:  Constitutional: WN/WD obese Caucasian female currently in no acute distress  NAD and appears calm  Eyes: Lids and conjunctivae normal, sclerae anicteric  ENMT: External Ears, Nose appear normal. Grossly normal hearing.  Neck: Appears normal, supple, no cervical masses, normal ROM, no appreciable thyromegaly; no JVD Respiratory: Slightly diminished to auscultation bilaterally, no wheezing, rales, rhonchi or crackles. Normal respiratory effort and patient is not tachypenic. No accessory muscle use.  Unlabored breathing and not wearing any supplemental oxygen via nasal cannula Cardiovascular: RRR, no murmurs / rubs / gallops. S1 and S2 auscultated.  Trace extremity edema Abdomen: Soft, non-tender, distended secondary to body habitus. Bowel sounds positive.  GU: Deferred. Musculoskeletal: No clubbing / cyanosis of digits/nails.  Left arm is in a brace Skin: No rashes, lesions, ulcers on limited skin evaluation. No induration; Warm and dry.  Neurologic: CN 2-12 grossly intact with no focal deficits. Romberg sign and cerebellar reflexes not assessed.  Psychiatric: Normal judgment and insight. Alert and oriented x 3. Normal mood and appropriate affect.   Data Reviewed: I have personally reviewed following labs and imaging studies  CBC: Recent Labs   Lab 07/02/21 1319 07/03/21 0848 07/04/21 1014 07/05/21 0607 07/06/21 1043 07/07/21 0951 07/08/21 1100  WBC 10.7*   < > 13.3* 12.5* 12.0* 12.3* 13.3*  NEUTROABS 7.2  --  9.7*  --  8.5* 8.5* 9.5*  HGB 13.3   < > 12.2 12.2 12.4 13.1 13.0  HCT 40.5   < > 37.8 36.8 37.9 40.5 39.5  MCV 92.7   < > 92.2 92.0 91.8 92.7 91.9  PLT 385   < > 376 354 405* 397 383   < > = values in this interval not displayed.    Basic Metabolic Panel: Recent Labs  Lab 07/03/21 0848 07/04/21 1014 07/05/21 0959 07/06/21 1043 07/07/21 0951  NA 132* 138 136 137 138  K 4.0 4.1 3.7 4.3 4.3  CL 98 105 101 102 100  CO2 29 27 27 25 30   GLUCOSE 141* 87 124* 134* 128*  BUN 16 10 11 13 10   CREATININE 1.08* 0.88 0.81 0.80 0.79  CALCIUM 9.1 9.5 9.3 9.9 10.2  MG  --   --  1.9 1.9 1.9  PHOS  --   --  3.4 3.8 3.9    GFR: Estimated Creatinine Clearance: 79.1 mL/min (by C-G formula based on SCr of 0.79 mg/dL). Liver Function Tests: Recent Labs  Lab 07/05/21 0959 07/06/21 1043 07/07/21 0951  AST 19 22 20   ALT 24 26 24   ALKPHOS 57 58 59  BILITOT 0.4 0.4 0.4  PROT 6.1* 6.5 6.8  ALBUMIN 2.8* 3.1* 3.2*    No results for input(s): LIPASE, AMYLASE in the last 168 hours. Recent Labs  Lab 07/03/21 1454  AMMONIA 38*    Coagulation Profile: No results for input(s): INR, PROTIME in the last 168 hours. Cardiac Enzymes: No results for input(s): CKTOTAL, CKMB, CKMBINDEX, TROPONINI in the last 168 hours. BNP (last 3 results) No results for input(s): PROBNP in the last 8760 hours. HbA1C: No results for input(s): HGBA1C in the last 72 hours. CBG: Recent Labs  Lab 07/07/21 0600 07/07/21 1155 07/07/21 1610 07/07/21 2105 07/08/21 0604  GLUCAP 74 140* 128* 170* 88    Lipid Profile: No results for input(s): CHOL, HDL, LDLCALC, TRIG, CHOLHDL, LDLDIRECT in the last 72 hours. Thyroid Function Tests: No results for input(s): TSH, T4TOTAL, FREET4, T3FREE, THYROIDAB in the last 72 hours. Anemia Panel: No  results for input(s): VITAMINB12, FOLATE, FERRITIN, TIBC, IRON, RETICCTPCT in the last 72 hours. Sepsis Labs: Recent  Labs  Lab 07/03/21 0935 07/03/21 0947 07/03/21 1228  PROCALCITON  --  0.12  --   LATICACIDVEN 0.8  --  0.7     Recent Results (from the past 240 hour(s))  Culture, Urine     Status: None   Collection Time: 06/29/21  5:51 PM   Specimen: Urine, Random  Result Value Ref Range Status   Specimen Description URINE, RANDOM  Final   Special Requests NONE  Final   Culture   Final    NO GROWTH Performed at [facility], [place] [address], [place], [place] [address]    Report Status 07/01/2021 FINAL  Final  Culture, Urine     Status: None   Collection Time: 07/03/21  9:14 AM   Specimen: Urine, Random  Result Value Ref Range Status   Specimen Description URINE, RANDOM  Final   Special Requests NONE  Final   Culture   Final    NO GROWTH Performed at [facility], [place] [address] [place], [place] [address]    Report Status 07/04/2021 FINAL  Final      RN Pressure Injury Documentation:     Estimated body mass index is 20.5 kg/m as calculated from the following:   Height as of this encounter: 5\' 6"  (1.676 m).   Weight as of this encounter: 57.6 kg.  Malnutrition Type:   Malnutrition Characteristics:   Nutrition Interventions:    Radiology Studies: No results found.  Scheduled Meds:  aspirin  81 mg Oral Daily   benztropine  0.5 mg Oral QHS   carvedilol  3.125 mg Oral BID WC   chlorproMAZINE  100 mg Oral QHS   clopidogrel  75 mg Oral Daily   cyanocobalamin  1,000 mcg Subcutaneous Q30 days   enoxaparin (LOVENOX) injection  40 mg Subcutaneous Q24H   ezetimibe  10 mg Oral Daily   FLUoxetine  60 mg Oral Daily   guaiFENesin  600 mg Oral BID   hydrocortisone cream   Topical BID   insulin aspart  0-5 Units Subcutaneous QHS   insulin aspart  0-9 Units Subcutaneous TID WC   insulin glargine  30 Units Subcutaneous BID   isosorbide mononitrate  60 mg  Oral BID   lamoTRIgine  200 mg Oral QHS   lidocaine  1 patch Transdermal Q24H   losartan  25 mg Oral Daily   nicotine  21 mg Transdermal Daily   pantoprazole  40 mg Oral Q1200   polyethylene glycol  17 g Oral Daily   pregabalin  225 mg Oral BID   rosuvastatin  20 mg Oral Daily   senna-docusate  2 tablet Oral BID   sodium chloride flush  10-40 mL Intracatheter Q12H   sorbitol  60 mL Oral Once   spironolactone  25 mg Oral Daily   thiamine  100 mg Oral Daily   torsemide  10 mg Oral Daily   traZODone  50 mg Oral QHS   Vitamin D (Ergocalciferol)  50,000 Units Oral Q7 days   Continuous Infusions:  sodium chloride Stopped (07/08/21 0447)   ampicillin-sulbactam (UNASYN) IV Stopped (07/08/21 0446)    LOS: 10 days   Kerney Elbe, DO [facility] PAGER is on AMION  If 7PM-7AM, please contact night-coverage [email or web address]

## 2021-07-08 NOTE — Progress Notes (Signed)
PROGRESS NOTE   Subjective/Complaints:  Pt reports slept OK- LBM 3 dasy ago- really wants intervention. Bloated feeling.   Having cluster HA's- having daily right now.  Can cause light sensitivity and nausea with them. Uses 15L O2 however nothing ever treats them completely.    ROS:  Pt denies SOB, abd pain, CP, N/V/C/D, and vision changes  Objective:   DG CHEST PORT 1 VIEW  Result Date: 07/08/2021 CLINICAL DATA:  50 year old female follow-up pneumonia. EXAM: PORTABLE CHEST 1 VIEW COMPARISON:  Portable chest 07/06/2021 and CT 07/03/2021. FINDINGS: Portable AP semi upright view at 0948 hours. Larger lung volumes. Mildly rotated to the right. Mediastinal contours remain normal. Prior CABG. Visualized tracheal air column is within normal limits. No pneumothorax, pulmonary edema or pleural effusion. Streaky opacity at the right lung base appears regressed since 07/03/2021. No areas of worsening ventilation. Paucity of bowel gas in the upper abdomen. No acute osseous abnormality identified. IMPRESSION: 1. Regressed but not fully resolved streaky opacity at the right lung base since 07/03/2021 compatible with regression of infection and/or atelectasis. 2. No new cardiopulmonary abnormality. Electronically Signed   By: Genevie Ann M.D.   On: 07/08/2021 12:19   Recent Labs    07/07/21 0951 07/08/21 1100  WBC 12.3* 13.3*  HGB 13.1 13.0  HCT 40.5 39.5  PLT 397 383   Recent Labs    07/07/21 0951 07/08/21 1100  NA 138 136  K 4.3 4.2  CL 100 98  CO2 30 30  GLUCOSE 128* 175*  BUN 10 12  CREATININE 0.79 0.86  CALCIUM 10.2 10.0    Intake/Output Summary (Last 24 hours) at 07/08/2021 1848 Last data filed at 07/08/2021 0756 Gross per 24 hour  Intake 118 ml  Output --  Net 118 ml        Physical Exam: Vital Signs Blood pressure 106/67, pulse 62, temperature 98.5 F (36.9 C), temperature source Oral, resp. rate 16, height 5\' 6"   (1.676 m), weight 57.6 kg, SpO2 95 %.    General: awake, alert, appropriate, daughter and husband in room; sitting in bedside chair; c/o cluster HA; NAD HENT: conjugate gaze; oropharynx moist CV: regular rate; no JVD Pulmonary: CTA B/L; no W/R/R- good air movement GI: soft, NT, (+)BS- bloated; hypoactive Psychiatric: appropriate; but flat; delayed Neurological: alert, but delayed  Skin: intact Neurological:    Mental Status: She is alert and oriented to person, place, and time.    Comments: Left facial weakness with mild dysarthria. Able to answer orientation questions and follow commands without difficulty.  0/5 LUE and LLE 4/5 on Right side    Assessment/Plan: 1. Functional deficits which require 3+ hours per day of interdisciplinary therapy in a comprehensive inpatient rehab setting. Physiatrist is providing close team supervision and 24 hour management of active medical problems listed below. Physiatrist and rehab team continue to assess barriers to discharge/monitor patient progress toward functional and medical goals  Care Tool:  Bathing    Body parts bathed by patient: Chest, Abdomen, Front perineal area, Right upper leg, Left upper leg, Face, Left arm, Right arm, Right lower leg, Left lower leg (with long sponge)   Body parts bathed by helper:  Right arm, Buttocks, Right lower leg, Left lower leg     Bathing assist Assist Level: Minimal Assistance - Patient > 75%     Upper Body Dressing/Undressing Upper body dressing   What is the patient wearing?: Pull over shirt    Upper body assist Assist Level: Minimal Assistance - Patient > 75%    Lower Body Dressing/Undressing Lower body dressing      What is the patient wearing?: Pants     Lower body assist Assist for lower body dressing: Moderate Assistance - Patient 50 - 74%     Toileting Toileting    Toileting assist Assist for toileting: Moderate Assistance - Patient 50 - 74%     Transfers Chair/bed  transfer  Transfers assist     Chair/bed transfer assist level: Moderate Assistance - Patient 50 - 74%     Locomotion Ambulation   Ambulation assist      Assist level: 2 helpers Assistive device: [name] Max distance: 52ft   Walk 10 feet activity   Assist     Assist level: 2 helpers Assistive device: Walker-Eva   Walk 50 feet activity   Assist Walk 50 feet with 2 turns activity did not occur: Safety/medical concerns  Assist level: Dependent - Patient 0% Assistive device: Lite Gait    Walk 150 feet activity   Assist Walk 150 feet activity did not occur: Safety/medical concerns         Walk 10 feet on uneven surface  activity   Assist Walk 10 feet on uneven surfaces activity did not occur: Safety/medical concerns         Wheelchair     Assist Will patient use wheelchair at discharge?: No Type of Wheelchair: Manual    Wheelchair assist level: Minimal Assistance - Patient > 75% Max wheelchair distance: 132ft    Wheelchair 50 feet with 2 turns activity    Assist        Assist Level: Minimal Assistance - Patient > 75%   Wheelchair 150 feet activity     Assist          Blood pressure 106/67, pulse 62, temperature 98.5 F (36.9 C), temperature source Oral, resp. rate 16, height 5\' 6"  (1.676 m), weight 57.6 kg, SpO2 95 %.    Medical Problem List and Plan: 1.  Right pontine stroke             -patient may shower             -ELOS: 8/2             -husband may come in before 8am  Con't PT, and OT/CIR- may have grounds pass as long as doesn't miss therapy or meds-  2.  Impaired mobility: Continue Lovenox             -antiplatelet therapy: Continue ASA/Plavix 3. Cluster headaches and migraines: continue abortive oxygen PRN. Oxycodone prn for HA. Discussed outpatient Botox- patient would like to try.   7/16- will try Periactin prn daily and phenergan for nausea associated.  4. Mood: Team to provide ego support. LCSW to  follow for evaluation and support.             -antipsychotic agents: Lamictal daily. 5. Neuropsych: This patient is capable of making decisions on her own behalf. 6. Skin/Wound Care: Routine pressure relief measures. 7. Fluids/Electrolytes/Nutrition: Monitor I/O. Check lytes in am. 8. HTN: SBP goal 140-180 range to avoid hypoperfusion of brain. Per husband "acts drunk when BP is low" --  Continue Imdur, Coreg, aldactone, Demadex, Per reports 7/14: BP below goal given stenosis: decrease coreg to 3.125mg  BID.  Vitals:   07/08/21 0415 07/08/21 1414  BP: 134/63 106/67  Pulse: (!) 54 62  Resp: 16 16  Temp: 97.9 F (36.6 C) 98.5 F (36.9 C)  SpO2: 93% 95%  Avoid hypoperfusion, TRH following BP management , neg fluid balance reduce diuretic -discussed with TRH 9. T2DM: Hgb A1C- 10.7. BS run 200's at home. Dietary education/reinforcement.             --Continue Lantus  45 units BID with SSI for elevated BS             --7/10: hypoglycemic to 58l, decrease Lantus to 35U BID CBG (last 3)  Recent Labs    07/08/21 0604 07/08/21 1137 07/08/21 1713  GLUCAP 88 206* 132*  CBG a little low will reduce lantus to 30U   7/16- BG's 88-202- con't regimen for now 10. CAD s/p CABG: On ASA, coreg, Cozaar, Demadex, ASA, 11. PAD w/claudication: Has been smoking 1 PPD --quit at admission. Continue Nicotine patch. --On lyrica BID to manage symptoms.   12. Aspiration PNA: CT reviewed, appreciate medicine consult, WBC up, perhaps new aspiration pneumonia and started on Unasyn IV 7/11.  13. Chronic cluster headaches, currently well controlled: Managed with thorazine as well as oxycodone and high flow oxygen prn. Decreased oxycodone to TID PRN and edited order to indicate it should be given after therapy as per husband's request- makes her too sedated to tolerate therapy. 7/16- will try Periactin prn for associated Sx's-con't oxy and O2.   14. H/o depression/anxiety: Continue Lamictal, Prozac with Klonopin  prn. 15. Chronic insomnia: Managed with home dose trazodone. 16. Dyslipidemia: Trig- 636, direct LDL-99.6, HDL 29, Chol 202. On Zetia--to start Vespc 17. Vitamin B 12 deficiency: B12- 169 (was WNL a year ago) 18. NASH/Fatty liver: LFTs reviewed and normalized 19. Dense left sided hemiplegia: [place] regarding NMES- started trials Friday. They have TENS unit at home.  20. Hypokalemia: supplement 7meq 7/7 and repeat BMP reviewed, K+ improved to 3.8. Resolved. D/c daily supplement.  21. Leukocytosis: trending upward potentially secondary to aspiration pneumonia, repeat today  7/16- is afebrile, but slightly up again- will check again tomorrow- don't see a source, because CXR looks better 22. Vitamin D deficiency: start ergocalciferol 50,000U weekly for 7 weeks.  23.  Hypoxia ,improved  was not on O2 at admission, has been treated for asp PNA with IV abx for several days and WBC are normalizing.  ? Fluid overload from IV abx, crackles at bases but recent ECHO looked ok   IM consulting 24. AKI: repeat BMP 7/14 nl 24. Disposition: Lives with husband and daughter (autistic but high functioning)- husband very involved and has already coordinated with his boss to take time off to care for her. Messaged April to schedule HFU/TC.    LOS: 10 days A FACE TO FACE EVALUATION WAS PERFORMED  Albin Duckett 07/08/2021, 6:48 PM

## 2021-07-08 NOTE — Progress Notes (Signed)
Patient of of unit with husband and daughter with therapeutic grounds pass. Sanda Linger, LPN

## 2021-07-09 DIAGNOSIS — I739 Peripheral vascular disease, unspecified: Secondary | ICD-10-CM

## 2021-07-09 DIAGNOSIS — Z9889 Other specified postprocedural states: Secondary | ICD-10-CM

## 2021-07-09 LAB — CBC WITH DIFFERENTIAL/PLATELET
Abs Immature Granulocytes: 0.29 10*3/uL — ABNORMAL HIGH (ref 0.00–0.07)
Basophils Absolute: 0 10*3/uL (ref 0.0–0.1)
Basophils Relative: 0 %
Eosinophils Absolute: 0.4 10*3/uL (ref 0.0–0.5)
Eosinophils Relative: 3 %
HCT: 41.3 % (ref 36.0–46.0)
Hemoglobin: 13.5 g/dL (ref 12.0–15.0)
Immature Granulocytes: 2 %
Lymphocytes Relative: 16 %
Lymphs Abs: 2.5 10*3/uL (ref 0.7–4.0)
MCH: 30.1 pg (ref 26.0–34.0)
MCHC: 32.7 g/dL (ref 30.0–36.0)
MCV: 92.2 fL (ref 80.0–100.0)
Monocytes Absolute: 0.7 10*3/uL (ref 0.1–1.0)
Monocytes Relative: 4 %
Neutro Abs: 12.2 10*3/uL — ABNORMAL HIGH (ref 1.7–7.7)
Neutrophils Relative %: 75 %
Platelets: 383 10*3/uL (ref 150–400)
RBC: 4.48 MIL/uL (ref 3.87–5.11)
RDW: 13.6 % (ref 11.5–15.5)
WBC: 16.2 10*3/uL — ABNORMAL HIGH (ref 4.0–10.5)
nRBC: 0 % (ref 0.0–0.2)

## 2021-07-09 LAB — COMPREHENSIVE METABOLIC PANEL
ALT: 25 U/L (ref 0–44)
AST: 20 U/L (ref 15–41)
Albumin: 3.2 g/dL — ABNORMAL LOW (ref 3.5–5.0)
Alkaline Phosphatase: 57 U/L (ref 38–126)
Anion gap: 8 (ref 5–15)
BUN: 9 mg/dL (ref 6–20)
CO2: 30 mmol/L (ref 22–32)
Calcium: 10.2 mg/dL (ref 8.9–10.3)
Chloride: 100 mmol/L (ref 98–111)
Creatinine, Ser: 0.83 mg/dL (ref 0.44–1.00)
GFR, Estimated: >60 mL/min (ref >60)
Glucose, Bld: 117 mg/dL — ABNORMAL HIGH (ref 70–99)
Potassium: 4.2 mmol/L (ref 3.5–5.1)
Sodium: 138 mmol/L (ref 135–145)
Total Bilirubin: 0.3 mg/dL (ref 0.3–1.2)
Total Protein: 7.2 g/dL (ref 6.5–8.1)

## 2021-07-09 LAB — GLUCOSE, CAPILLARY
Glucose-Capillary: 99 mg/dL (ref 70–99)
Glucose-Capillary: 116 mg/dL — ABNORMAL HIGH (ref 70–99)
Glucose-Capillary: 124 mg/dL — ABNORMAL HIGH (ref 70–99)
Glucose-Capillary: 138 mg/dL — ABNORMAL HIGH (ref 70–99)

## 2021-07-09 LAB — PHOSPHORUS: Phosphorus: 3.8 mg/dL (ref 2.5–4.6)

## 2021-07-09 LAB — MAGNESIUM: Magnesium: 2 mg/dL (ref 1.7–2.4)

## 2021-07-09 NOTE — Progress Notes (Signed)
PROGRESS NOTE   Subjective/Complaints:   Erika [name] reports her WBC is "always increased- around 12-13". Didn't take Periactin- thought would just get it- explained it's PRN- so have to ask for it.  HA is better today- but not resolved.   Pooped x2 last night and 1 time this AM.  No Sx's of Respiratory issues or UTI.   No other pain.   ROS:    Erika [name] denies SOB, abd pain, CP, N/V/C/D, and vision changes   Objective:   DG CHEST PORT 1 VIEW  Result Date: 07/08/2021 CLINICAL DATA:  50 year old female follow-up pneumonia. EXAM: PORTABLE CHEST 1 VIEW COMPARISON:  Portable chest 07/06/2021 and CT 07/03/2021. FINDINGS: Portable AP semi upright view at 0948 hours. Larger lung volumes. Mildly rotated to the right. Mediastinal contours remain normal. Prior CABG. Visualized tracheal air column is within normal limits. No pneumothorax, pulmonary edema or pleural effusion. Streaky opacity at the right lung base appears regressed since 07/03/2021. No areas of worsening ventilation. Paucity of bowel gas in the upper abdomen. No acute osseous abnormality identified. IMPRESSION: 1. Regressed but not fully resolved streaky opacity at the right lung base since 07/03/2021 compatible with regression of infection and/or atelectasis. 2. No new cardiopulmonary abnormality. Electronically Signed   By: Genevie Ann M.D.   On: 07/08/2021 12:19   Recent Labs    07/07/21 0951 07/08/21 1100  WBC 12.3* 13.3*  HGB 13.1 13.0  HCT 40.5 39.5  PLT 397 383   Recent Labs    07/08/21 1100 07/09/21 0348  NA 136 138  K 4.2 4.2  CL 98 100  CO2 30 30  GLUCOSE 175* 117*  BUN 12 9  CREATININE 0.86 0.83  CALCIUM 10.0 10.2   No intake or output data in the 24 hours ending 07/09/21 1353       Physical Exam: Vital Signs Blood pressure (!) 168/71, pulse 63, temperature 98.4 F (36.9 C), temperature source Oral, resp. rate 16, height 5\' 6"  (1.676 m), weight 57.6 kg, SpO2  94 %.      General: awake, alert, appropriate, sitting up in w/c with L arm trough with therapy heading towards gym; with Erika [name]; NAD HENT: conjugate gaze; oropharynx moist CV: regular rate; no JVD Pulmonary: CTA B/L; no W/R/R- good air movement GI: soft, NT, ND, (+)BS- not distension today- normoactive Psychiatric: appropriate- slightly delayed but better than yesterday Neurological: alert  Skin: intact Neurological:    Mental Status: She is alert and oriented to person, place, and time.    Comments: Left facial weakness with mild dysarthria. Able to answer orientation questions and follow commands without difficulty.  0/5 LUE and LLE 4/5 on Right side    Assessment/Plan: 1. Functional deficits which require 3+ hours per day of interdisciplinary therapy in a comprehensive inpatient rehab setting. Physiatrist is providing close team supervision and 24 hour management of active medical problems listed below. Physiatrist and rehab team continue to assess barriers to discharge/monitor patient progress toward functional and medical goals  Care Tool:  Bathing    Body parts bathed by patient: Chest, Abdomen, Front perineal area, Right upper leg, Left upper leg, Face, Left arm, Right arm, Right lower leg, Left  lower leg (with long sponge)   Body parts bathed by helper: Right arm, Buttocks, Right lower leg, Left lower leg     Bathing assist Assist Level: Minimal Assistance - Patient > 75%     Upper Body Dressing/Undressing Upper body dressing   What is the patient wearing?: Pull over shirt    Upper body assist Assist Level: Minimal Assistance - Patient > 75%    Lower Body Dressing/Undressing Lower body dressing      What is the patient wearing?: Pants     Lower body assist Assist for lower body dressing: Moderate Assistance - Patient 50 - 74%     Toileting Toileting    Toileting assist Assist for toileting: Moderate Assistance - Patient 50 - 74%      Transfers Chair/bed transfer  Transfers assist     Chair/bed transfer assist level: Moderate Assistance - Patient 50 - 74%     Locomotion Ambulation   Ambulation assist      Assist level: 2 helpers Assistive device: [name] Max distance: 66ft   Walk 10 feet activity   Assist     Assist level: 2 helpers Assistive device: Walker-Eva   Walk 50 feet activity   Assist Walk 50 feet with 2 turns activity did not occur: Safety/medical concerns  Assist level: Dependent - Patient 0% Assistive device: Lite Gait    Walk 150 feet activity   Assist Walk 150 feet activity did not occur: Safety/medical concerns         Walk 10 feet on uneven surface  activity   Assist Walk 10 feet on uneven surfaces activity did not occur: Safety/medical concerns         Wheelchair     Assist Will patient use wheelchair at discharge?: Yes Type of Wheelchair: Manual    Wheelchair assist level: Supervision/Verbal cueing Max wheelchair distance: 154ft    Wheelchair 50 feet with 2 turns activity    Assist        Assist Level: Supervision/Verbal cueing   Wheelchair 150 feet activity     Assist          Blood pressure (!) 168/71, pulse 63, temperature 98.4 F (36.9 C), temperature source Oral, resp. rate 16, height 5\' 6"  (1.676 m), weight 57.6 kg, SpO2 94 %.    Medical Problem List and Plan: 1.  Right pontine stroke             -patient may shower             -ELOS: 8/2             -husband may come in before 8am  Con't Erika [name], and OT/CIR- may have grounds pass as long as doesn't miss therapy or meds- -con't Erika [name], OT - PER IM_ her LLE is colder than RLE- and no palpable DP pulse- he called Vascular- they said nothing to do since already on right meds- con't to monitor closely.   2.  Impaired mobility: Continue Lovenox             -antiplatelet therapy: Continue ASA/Plavix 3. Cluster headaches and migraines: continue abortive oxygen PRN. Oxycodone prn  for HA. Discussed outpatient Botox- patient would like to try.   7/16- will try Periactin prn daily and phenergan for nausea associated.   7/17- hasn't tried yet, but is a little better- con't regimen 4. Mood: Team to provide ego support. LCSW to follow for evaluation and support.             -  antipsychotic agents: Lamictal daily. 5. Neuropsych: This patient is capable of making decisions on her own behalf. 6. Skin/Wound Care: Routine pressure relief measures. 7. Fluids/Electrolytes/Nutrition: Monitor I/O. Check lytes in am. 8. HTN: SBP goal 140-180 range to avoid hypoperfusion of brain. Per husband "acts drunk when BP is low" --Continue Imdur, Coreg, aldactone, Demadex, Per reports 7/14: BP below goal given stenosis: decrease coreg to 3.125mg  BID.  Vitals:   07/08/21 1929 07/09/21 0341  BP: 137/61 (!) 168/71  Pulse: 65 63  Resp: 18 16  Temp: 98.1 F (36.7 C) 98.4 F (36.9 C)  SpO2: 93% 94%  Avoid hypoperfusion, TRH following BP management , neg fluid balance reduce diuretic -discussed with TRH 9. T2DM: Hgb A1C- 10.7. BS run 200's at home. Dietary education/reinforcement.             --Continue Lantus  45 units BID with SSI for elevated BS             --7/10: hypoglycemic to 58, decrease Lantus to 35U BID CBG (last 3)  Recent Labs    07/08/21 2114 07/09/21 0615 07/09/21 1142  GLUCAP 123* 99 116*  CBG a little low will reduce lantus to 30U   7/16- BG's 88-202- con't regimen for now  7/17- BG's great control in last 24 hours- con't regimen 10. CAD s/p CABG: On ASA, coreg, Cozaar, Demadex, ASA, 11. PAD w/claudication: Has been smoking 1 PPD --quit at admission. Continue Nicotine patch. --On lyrica BID to manage symptoms.   12. Aspiration PNA: CT reviewed, appreciate medicine consult, WBC up, perhaps new aspiration pneumonia and started on Unasyn IV 7/11.  13. Chronic cluster headaches, currently well controlled: Managed with thorazine as well as oxycodone and high flow oxygen prn.  Decreased oxycodone to TID PRN and edited order to indicate it should be given after therapy as per husband's request- makes her too sedated to tolerate therapy. 7/16- will try Periactin prn for associated Sx's-con't oxy and O2.   14. H/o depression/anxiety: Continue Lamictal, Prozac with Klonopin prn. 15. Chronic insomnia: Managed with home dose trazodone. 16. Dyslipidemia: Trig- 636, direct LDL-99.6, HDL 29, Chol 202. On Zetia--to start Vespc 17. Vitamin B 12 deficiency: B12- 169 (was WNL a year ago) 18. NASH/Fatty liver: LFTs reviewed and normalized 19. Dense left sided hemiplegia: [place] regarding NMES- started trials Friday. They have TENS unit at home.  20. Hypokalemia: supplement 42meq 7/7 and repeat BMP reviewed, K+ improved to 3.8. Resolved. D/c daily supplement.  21. Leukocytosis: trending upward potentially secondary to aspiration pneumonia, repeat today  7/16- is afebrile, but slightly up again- will check again tomorrow- don't see a source, because CXR looks better  7/17- per Erika [name], is "always elevated"- will let primary doctor look into this.  22. Vitamin D deficiency: start ergocalciferol 50,000U weekly for 7 weeks.  23.  Hypoxia ,improved  was not on O2 at admission, has been treated for asp PNA with IV abx for several days and WBC are normalizing.  ? Fluid overload from IV abx, crackles at bases but recent ECHO looked ok   IM consulting 24. AKI: repeat BMP 7/14 nl 25. Poor perfusion of LLE  7/17- per IM, LLE is colder than RLE_ by a lot- and no DP pulse palpable- Vascular said con't current regimen and monitor closely for low perfusion injury.  246. Disposition: Lives with husband and daughter (autistic but high functioning)- husband very involved and has already coordinated with his boss to take time off to care for her.  Messaged April to schedule HFU/TC.    LOS: 11 days A FACE TO FACE EVALUATION WAS PERFORMED  Dalaysia Harms 07/09/2021, 1:53 PM

## 2021-07-09 NOTE — Plan of Care (Signed)
  Problem: Consults Goal: RH STROKE PATIENT EDUCATION Description: See Patient Education module for education specifics  Outcome: Progressing   Problem: RH BOWEL ELIMINATION Goal: RH STG MANAGE BOWEL WITH ASSISTANCE Description: STG Manage Bowel with mod I Assistance. Outcome: Progressing Goal: RH STG MANAGE BOWEL W/MEDICATION W/ASSISTANCE Description: STG Manage Bowel with Medication with mod i Assistance. Outcome: Progressing   Problem: RH SAFETY Goal: RH STG ADHERE TO SAFETY PRECAUTIONS W/ASSISTANCE/DEVICE Description: STG Adhere to Safety Precautions With cues/reminders Assistance/Device. Outcome: Progressing   Problem: RH COGNITION-NURSING Goal: RH STG USES MEMORY AIDS/STRATEGIES W/ASSIST TO PROBLEM SOLVE Description: STG Uses Memory Aids/Strategies With cues/reminders Assistance to Problem Solve. Outcome: Progressing   Problem: RH PAIN MANAGEMENT Goal: RH STG PAIN MANAGED AT OR BELOW PT'S PAIN GOAL Description: At or below level 4 Outcome: Progressing   Problem: RH KNOWLEDGE DEFICIT Goal: RH STG INCREASE KNOWLEDGE OF DIABETES Description: Patient will be able to manage DM with medications and dietary modifications using handouts and educational tools independently Outcome: Progressing Goal: RH STG INCREASE KNOWLEDGE OF HYPERTENSION Description: Patient will be able to manage HTN with medications and dietary modifications using handouts and educational tools independently Outcome: Progressing Goal: RH STG INCREASE KNOWLEGDE OF HYPERLIPIDEMIA Description: Patient will be able to manage HLD with medications and dietary modifications using handouts and educational tools independently Outcome: Progressing Goal: RH STG INCREASE KNOWLEDGE OF STROKE PROPHYLAXIS Description: Patient will be able to manage secondary stroke risks with medications and dietary modifications using handouts and educational tools independently Outcome: Progressing   Problem: RH KNOWLEDGE  DEFICIT Goal: RH STG INCREASE KNOWLEDGE OF DYSPHAGIA/FLUID INTAKE Description: Patient will be able to manage Dysphagia , medications and dietary modifications using handouts and educational tools independently Outcome: Progressing

## 2021-07-09 NOTE — Progress Notes (Signed)
Speech Language Pathology Daily Session Note  Patient Details  Name: Erika [name] MRN: [id number] Date of Birth: 1971-08-05  Today's Date: 07/09/2021 SLP Individual Time: 1300-1345 SLP Individual Time Calculation (min): 45 min  Short Term Goals: Week 2: SLP Short Term Goal 1 (Week 2): Patient will consume current diet with minimal overt s/s of aspiration with Min verbal cues for use of swallowing compensatory strategies. SLP Short Term Goal 2 (Week 2): Patient will utilize speech intelligibility strategies at the sentencen level to achieve ~75% intelligibility with Min verbal cues. SLP Short Term Goal 3 (Week 2): Patient will demonstrate selective attention to functional tasks in a mildly distracting enviornment for 30 minutes with Min A verbal cues for redirection. SLP Short Term Goal 4 (Week 2): Patient will demonstrate functional problem solving during mildly complex tasks with supervision verbal cues. SLP Short Term Goal 5 (Week 2): Patient will demonstrate recall of functional information as it relates to care and safety with supervision verbal cues.  Skilled Therapeutic Interventions:  Pt was seen for skilled ST targeting goals for dysphagia and cognition.  Therapist facilitated the session with therapeutic trials of dys 3 textures to continue working towards diet advancement.  Pt had prolonged mastication of advanced solids but was able to clear oral cavity of residue post swallow.  No overt s/s of aspiration were evident with solids or liquids.  SLP also facilitated the session with a novel card game to address problem solving, recall, and selective attention goals.  Pt selectively attended to task in the presence of environmental distraction for its duration (~15 min) with x1 min assist verbal cue for redirection.  She needed supervision verbal cues and a written aid for recall of task rules and procedures.  Pt needed min faded to supervision verbal cues to plan and execute a problem  solving strategy within task.  Pt was left in chair with mother in law at bedside.  Continue per current plan of care.    Pain Pain Assessment Pain Scale: 0-10 Pain Score: 0-No pain  Therapy/Group: Individual Therapy  Shadi Larner, Selinda Orion 07/09/2021, 3:59 PM

## 2021-07-09 NOTE — Progress Notes (Signed)
Physical Therapy Session Note  Patient Details  Name: Erika [name] MRN: [id number] Date of Birth: 1971-04-09  Today's Date: 07/09/2021 PT Individual Time: 0900-1000 PT Individual Time Calculation (min): 60 min   Short Term Goals: Week 2:  PT Short Term Goal 1 (Week 2): Pt will complete bed mobility with CGA PT Short Term Goal 2 (Week 2): Pt will complete bed<>chair transfers with minA PT Short Term Goal 3 (Week 2): Pt will ambulate 81ft with modA of 1 person and LRAD  Skilled Therapeutic Interventions/Progress Updates:    Pt received seated in w/c in room, agreeable to PT session. Pt reports ongoing pain from headaches as well as pain in BLE from PAD, not rated and declines intervention. Sit to stand in // bars with CGA to min A, heavy reliance on RUE to pull into standing with minimal weight bearing through LLE noted. Standing lateral weight shifts L/R with min A for balance, L knee blocked, heavy reliance on RUE on // bar. Standing alt LE lifts in // bars with RUE support, pt unable to fully lift LLE from the ground due to muscle weakness. Minisquats 2 x 5 reps in // bars with min A for balance, manual assist for L knee control. Manual w/c propulsion x 75 ft with use of RUE and RLE with cues for correct propulsion technique at Supervision level. Seated hip/knee extension on Kinetron 5 x 30 sec at 50 cm/sec for LE strengthening. Pt left seated in w/c in room with needs in reach, chair alarm in place, husband present at end of session.  Therapy Documentation Precautions:  Precautions Precautions: Fall Precaution Comments: L hemiplegia Restrictions Weight Bearing Restrictions: Yes LUE Weight Bearing: Weight bearing as tolerated LLE Weight Bearing: Weight bearing as tolerated     Therapy/Group: Individual Therapy   Excell Seltzer, PT, DPT, CSRS  07/09/2021, 12:06 PM

## 2021-07-09 NOTE — Progress Notes (Signed)
Nurse in formed that patient is allowed to have spouse or mother sleep over per D.O.N. Sanda Linger, LPN

## 2021-07-09 NOTE — Progress Notes (Signed)
Occupational Therapy Session Note  Patient Details  Name: Erika [name] MRN: [id number] Date of Birth: Feb 05, 1971  Today's Date: 07/09/2021 OT Individual Time: 1430-1530 OT Individual Time Calculation (min): 60 min   Short Term Goals: Week 1:  OT Short Term Goal 1 (Week 1): Pt will be able to complete toilet transfer squat pivot with min A. OT Short Term Goal 2 (Week 1): Pt will be able to stand with min A and pull pants over hips with min A. OT Short Term Goal 3 (Week 1): Pt will be able to don shirt with min A. OT Short Term Goal 4 (Week 1): Pt will be able to self mobilize LUE.  Skilled Therapeutic Interventions/Progress Updates:   Pt greeted in the w/c, reported she felt very tired from previous therapies. ADL needs met. We washed her hair using the hair washing tray to brighten affect at start of session. After pt combed her hair, escorted her via w/c to the dayroom. Worked on [place] by guiding pt through tabletop ROM using the arm skate and then using the UE ranger. Pt with some proximal activation with gravity minimized, though very weak and slow to engage. OT utilized tapping technique to facilitate muscle recruitment. At end of session pt completed squat pivot<bed with Mod A. Mod A to transition to supine. She was left in care of RN to administer pain medicine for HA and also flush IV line. OT provided pt with lavender aromatherapy to holistically address HA discomfort.   Therapy Documentation Precautions:  Precautions Precautions: Fall Precaution Comments: L hemiplegia Restrictions Weight Bearing Restrictions: Yes LUE Weight Bearing: Weight bearing as tolerated LLE Weight Bearing: Weight bearing as tolerated  Vital Signs: Therapy Vitals Temp: 98.1 F (36.7 C) Temp Source: Oral Pulse Rate: 66 Resp: 17 BP: (!) 147/69 Patient Position (if appropriate): Lying Oxygen Therapy SpO2: 94 % O2 Device: Room Air   ADL: ADL Eating: Set up Grooming: Setup Upper Body  Bathing: Minimal assistance Lower Body Bathing: Moderate assistance Upper Body Dressing: Moderate assistance Lower Body Dressing: Maximal assistance Toileting: Maximal assistance Where Assessed-Toileting: Bedside Commode Toilet Transfer: Moderate assistance Toilet Transfer Method: Squat pivot Toilet Transfer Equipment: Drop arm bedside commode     Therapy/Group: Individual Therapy  Zebulen Simonis A Hiroki Wint 07/09/2021, 3:54 PM

## 2021-07-09 NOTE — Progress Notes (Addendum)
PROGRESS NOTE    Erika [name]  RSW:[id number] DOB: 19-Nov-1971 DOA: 06/28/2021 PCP: Lesleigh Noe, MD   Brief Narrative:  Patient is a 50 year old Caucasian female who was hospitalized from 06/24/21 until 06/28/2021 for acute CVA.  Her hospital course was complicated by hypoxia and sepsis due to aspiration event she is ultimately discharged to CIR.  While she was at [facility] noted to have further hypoxia and evaluation revealed recurrent aspiration pneumonia.  TRH was consulted by evaluation by the CIR MD. she has been placed on IV Unasyn and her leukocytosis is improving as well as her respiratory status.  She is no longer hypoxic and will need an ambulatory home O2 screen prior to discharge.  We will repeat chest x-ray today still pending to be done.  She will also have a repeat swallow study done and she has now been placed on a dysphagia 2 diet.  IVF Hydration now stopped. WBC is slowly trending up so will continue to Monitor Closely and repeat is pending   Assessment & Plan:   Principal Problem:   Right pontine stroke ([place])  Acute Respiratory Failure with Hypoxia due to likely recurrent aspiration pneumonia -She was hypoxic but hypoxia is resolved -SpO2: 94 % O2 Flow Rate (L/min): 3 L/min; no longer wearing 3 L of supplemental oxygen via nasal cannula and saturating well on ROOM Air -Agree with IV Unasyn and will continue to Monitor Respiratory Status Carefully; Recommending Stopping IV Unasyn on 07/10/21 -She has now been placed on a Dysphagia 2 diet. -We will add flutter valve, incentive spirometry and change Robitussin-DM to the schedule guaifenesin 6 mg p.o. twice daily and leave the Robitussin-DM 5 to 10 mL every 6 hours as needed cough as necessary -Was Getting gentle IVF hydration with NS at 75 mL/hr but now stopped -WBC was trending down and went from 21.2 -> 13.3 -> 12.5 -> 12.0. Now is trending up slolwy and went from 12.0 -> 12.3 -> 13.3 but Respiratory Symptoms have improved and  repeat today is pending  -Continue with albuterol 3 mL nebulized treatments every 4 hours as needed for wheezing or shortness of breath -Repeat CXR this AM showed "Regressed but not fully resolved streaky opacity at the right lung base since 07/03/2021 compatible with regression of infection and/or atelectasis. No new cardiopulmonary abnormality." -Will need an Ambulatory Home O2 Screen prior to Discharging   Recent CVA-with left-sided hemiplegia/dysarthria PAD with Claudication s/p Stenting  -Continue Aspirin 81 mg p.o. daily as well as Clopidogrel 75 mg p.o. daily per neurology recommendations -Patient is on a Statin as below -Continue rehabilitative efforts with Aggressive PT/OT  -On exam her Left Extremity is much colder than her Right. Difficulty obtaining Pedal Pulse. No evidence of Tissue loss and Husband states she doesn't complain of any pain or any rest pain. -She sees Dr. Donnetta Hutching as an outpatient; Spoke with Dr. Carlis Abbott of Vascular today who recommended observation and no ABI's as well as outpatient follow up as she has no rest pain, numbness, tissue loss or discoloration; Recommend following up as an outpatient with Dr. Donnetta Hutching or if she develops symptoms or concern for Acute Limb Ischemia recommending consulting Vascular formally   Constipation -Patient has not had a bowel movement in 3 Days -C/w Bisacodyl 10 mg RC Dailyprn Moderate Constipation and Miralax 17 grams po Daily and 17 gram Dailyprn Mild Constipation; Also has Sorbitol 70% solution 30 mL and was ordered Sorbitol 70$ solution 25 mg po Daily -Also has Senna-Docusate 2 tab  po BID -Had a very large bowel movement per Husband    HLD -Continue Rosuvastatin 20 g p.o. daily as well as ezetimibe 10 mg p.o. daily  Tobacco Abuse -Smoking cessation counseling given -Continue nicotine patch 20 mg transdermally every 24 hours   HTN -BP stable and elevated today at 168/71 -Continue Losartan 25 mg po Daily, Carvedilol 3.125 mg po BID,  Isosorbide Mononitrate 60 mg po BID, Spironolactone 25 mg po Daily, -Agree with decreasing Torsemide to 10 mg po Daily    History of CAD-s/p CABG Chronic Diastolic Dsyfunction -No anginal symptoms -On dual antiplatelet and Statin as above -Also has NTG SL 0.4 mg q48min pRN [place] Pain -Continue to Monitor Volume Status carefully as she was getting IVF. No longer on IVF   Depression/Anxiety/PTSD -C/w Benztropine 0.5 mg po qHS -Chlorpromazine 100 mg po qHS -C/w Clonazepam 0.5 mg po BIDprn Anxiety/Agitation/Panic -C/w Fluoxetine 60 mg po Daily  -C/w Lamtorigine 200 mg po qHS  Thrombocytosis -Likely Reactive and resolved -Platelet Count went from 354 -> 405 -> 397 -> 383 and repeat Pending this AM  -Continue to Monitor and Trend   GERD -C/w PPI with Pantoprazole 40 mg po Daily    DM-2 -CBGs seem adequately well controlled -Continue 33 units of Lantus BID and Sensitive Novolog SSI AC/HS -Continue to monitor CBG's per protocol; CBGs ranging from 88-170; Blood Sugar this AM on CMP was 175   Obesity -Weight loss and Dietary Counseling given -BMI is not accurately documented  -Nutritionist involved and patient on a Dysphagia 2 Diet   DVT prophylaxis: Enoxaparin 40 mg sq q24h Code Status: FULL CODE Family Communication: Discussed with Husband at bedside  Disposition Plan: Per Primary Team  Consultants:  TRH  Procedures: None  Antimicrobials:  Anti-infectives (From admission, onward)    Start     Dose/Rate Route Frequency Ordered Stop   07/03/21 1930  Ampicillin-Sulbactam (UNASYN) 3 g in sodium chloride 0.9 % 100 mL IVPB        3 g 200 mL/hr over 30 Minutes Intravenous Every 6 hours 07/03/21 1838     07/03/21 0400  vancomycin (VANCOREADY) IVPB 750 mg/150 mL  Status:  Discontinued        750 mg 150 mL/hr over 60 Minutes Intravenous Every 12 hours 07/02/21 1426 07/03/21 0533   07/02/21 1600  ceFEPIme (MAXIPIME) 2 g in sodium chloride 0.9 % 100 mL IVPB  Status:  Discontinued         2 g 200 mL/hr over 30 Minutes Intravenous Every 8 hours 07/02/21 1353 07/03/21 0533   07/02/21 1400  vancomycin (VANCOREADY) IVPB 1250 mg/250 mL        1,250 mg 166.7 mL/hr over 90 Minutes Intravenous  Once 07/02/21 1312 07/02/21 1819   06/28/21 1500  Ampicillin-Sulbactam (UNASYN) 3 g in sodium chloride 0.9 % 100 mL IVPB  Status:  Discontinued        3 g 200 mL/hr over 30 Minutes Intravenous Every 6 hours 06/28/21 1321 07/02/21 1353        Subjective: Seen and examined at bedside and she was resting and asleep when I walked in.  Husband states that she had a very large bowel movement yesterday.  He also states that she enjoyed staying outside.  Patient had been complaining to her husband that her left leg felt a lot colder than her right leg yesterday evening.  She denied any pain to him and no numbness.  Husband thinks that her respiratory status is stable and  improved significantly.  No other concerns or complaints at this time.  Objective: Vitals:   07/08/21 0415 07/08/21 1414 07/08/21 1929 07/09/21 0341  BP: 134/63 106/67 137/61 (!) 168/71  Pulse: (!) 54 62 65 63  Resp: 16 16 18 16   Temp: 97.9 F (36.6 C) 98.5 F (36.9 C) 98.1 F (36.7 C) 98.4 F (36.9 C)  TempSrc: Oral Oral Oral Oral  SpO2: 93% 95% 93% 94%  Weight:      Height:       No intake or output data in the 24 hours ending 07/09/21 1128  Filed Weights   06/28/21 1313  Weight: 57.6 kg   Examination: Physical Exam:  Constitutional: WN/WD obese Caucasian female in NAD and appears calm and comfortable sleeping Eyes: Lids Closed  ENMT: External Ears, Nose appear normal.  Neck: Appears normal, supple, no cervical masses, normal ROM, no appreciable thyromegaly; no Visible JVD Respiratory: Diminished to auscultation bilaterally with slightly coarse breath sounds, no wheezing, rales, rhonchi or crackles. Normal respiratory effort and patient is not tachypenic. No accessory muscle use. Unlabored breathing   Cardiovascular: RRR, no murmurs / rubs / gallops. S1 and S2 auscultated. Mild edema. Left Leg colder than Right and difficult to palpate pedal pulses Abdomen: Soft, non-tender, Distended 2/2 body habitus. Bowel sounds positive.  GU: Deferred. Musculoskeletal: No clubbing / cyanosis of digits/nails. No joint deformity upper and lower extremities.  Skin: No rashes, lesions, ulcers on a limited skin evaluation. No induration; Warm and dry.  Neurologic: Patient somnolent and drowsy and sleeping  Psychiatric: Not assessed due to her sleeping   Data Reviewed: I have personally reviewed following labs and imaging studies  CBC: Recent Labs  Lab 07/02/21 1319 07/03/21 0848 07/04/21 1014 07/05/21 0607 07/06/21 1043 07/07/21 0951 07/08/21 1100  WBC 10.7*   < > 13.3* 12.5* 12.0* 12.3* 13.3*  NEUTROABS 7.2  --  9.7*  --  8.5* 8.5* 9.5*  HGB 13.3   < > 12.2 12.2 12.4 13.1 13.0  HCT 40.5   < > 37.8 36.8 37.9 40.5 39.5  MCV 92.7   < > 92.2 92.0 91.8 92.7 91.9  PLT 385   < > 376 354 405* 397 383   < > = values in this interval not displayed.    Basic Metabolic Panel: Recent Labs  Lab 07/05/21 0959 07/06/21 1043 07/07/21 0951 07/08/21 1100 07/09/21 0348  NA 136 137 138 136 138  K 3.7 4.3 4.3 4.2 4.2  CL 101 102 100 98 100  CO2 27 25 30 30 30   GLUCOSE 124* 134* 128* 175* 117*  BUN 11 13 10 12 9   CREATININE 0.81 0.80 0.79 0.86 0.83  CALCIUM 9.3 9.9 10.2 10.0 10.2  MG 1.9 1.9 1.9 1.8 2.0  PHOS 3.4 3.8 3.9 4.3 3.8    GFR: Estimated Creatinine Clearance: 76.2 mL/min (by C-G formula based on SCr of 0.83 mg/dL). Liver Function Tests: Recent Labs  Lab 07/05/21 0959 07/06/21 1043 07/07/21 0951 07/08/21 1100 07/09/21 0348  AST 19 22 20 22 20   ALT 24 26 24 23 25   ALKPHOS 57 58 59 55 57  BILITOT 0.4 0.4 0.4 0.6 0.3  PROT 6.1* 6.5 6.8 6.8 7.2  ALBUMIN 2.8* 3.1* 3.2* 3.2* 3.2*    No results for input(s): LIPASE, AMYLASE in the last 168 hours. Recent Labs  Lab 07/03/21 1454   AMMONIA 38*    Coagulation Profile: No results for input(s): INR, PROTIME in the last 168 hours. Cardiac Enzymes: No results  for input(s): CKTOTAL, CKMB, CKMBINDEX, TROPONINI in the last 168 hours. BNP (last 3 results) No results for input(s): PROBNP in the last 8760 hours. HbA1C: No results for input(s): HGBA1C in the last 72 hours. CBG: Recent Labs  Lab 07/08/21 0604 07/08/21 1137 07/08/21 1713 07/08/21 2114 07/09/21 0615  GLUCAP 88 206* 132* 123* 99    Lipid Profile: No results for input(s): CHOL, HDL, LDLCALC, TRIG, CHOLHDL, LDLDIRECT in the last 72 hours. Thyroid Function Tests: No results for input(s): TSH, T4TOTAL, FREET4, T3FREE, THYROIDAB in the last 72 hours. Anemia Panel: No results for input(s): VITAMINB12, FOLATE, FERRITIN, TIBC, IRON, RETICCTPCT in the last 72 hours. Sepsis Labs: Recent Labs  Lab 07/03/21 0935 07/03/21 0947 07/03/21 1228  PROCALCITON  --  0.12  --   LATICACIDVEN 0.8  --  0.7     Recent Results (from the past 240 hour(s))  Culture, Urine     Status: None   Collection Time: 06/29/21  5:51 PM   Specimen: Urine, Random  Result Value Ref Range Status   Specimen Description URINE, RANDOM  Final   Special Requests NONE  Final   Culture   Final    NO GROWTH Performed at [facility], [address] [place], [place] [address]    Report Status 07/01/2021 FINAL  Final  Culture, Urine     Status: None   Collection Time: 07/03/21  9:14 AM   Specimen: Urine, Random  Result Value Ref Range Status   Specimen Description URINE, RANDOM  Final   Special Requests NONE  Final   Culture   Final    NO GROWTH Performed at [facility], [place] [address], [place], [place] [address]    Report Status 07/04/2021 FINAL  Final      RN Pressure Injury Documentation:     Estimated body mass index is 20.5 kg/m as calculated from the following:   Height as of this encounter: 5\' 6"  (1.676 m).   Weight as of this encounter: 57.6  kg.  Malnutrition Type:   Malnutrition Characteristics:   Nutrition Interventions:    Radiology Studies: DG CHEST PORT 1 VIEW  Result Date: 07/08/2021 CLINICAL DATA:  50 year old female follow-up pneumonia. EXAM: PORTABLE CHEST 1 VIEW COMPARISON:  Portable chest 07/06/2021 and CT 07/03/2021. FINDINGS: Portable AP semi upright view at 0948 hours. Larger lung volumes. Mildly rotated to the right. Mediastinal contours remain normal. Prior CABG. Visualized tracheal air column is within normal limits. No pneumothorax, pulmonary edema or pleural effusion. Streaky opacity at the right lung base appears regressed since 07/03/2021. No areas of worsening ventilation. Paucity of bowel gas in the upper abdomen. No acute osseous abnormality identified. IMPRESSION: 1. Regressed but not fully resolved streaky opacity at the right lung base since 07/03/2021 compatible with regression of infection and/or atelectasis. 2. No new cardiopulmonary abnormality. Electronically Signed   By: Genevie Ann M.D.   On: 07/08/2021 12:19    Scheduled Meds:  aspirin  81 mg Oral Daily   benztropine  0.5 mg Oral QHS   carvedilol  3.125 mg Oral BID WC   chlorproMAZINE  100 mg Oral QHS   clopidogrel  75 mg Oral Daily   cyanocobalamin  1,000 mcg Subcutaneous Q30 days   enoxaparin (LOVENOX) injection  40 mg Subcutaneous Q24H   ezetimibe  10 mg Oral Daily   FLUoxetine  60 mg Oral Daily   guaiFENesin  600 mg Oral BID   hydrocortisone cream   Topical BID  insulin aspart  0-5 Units Subcutaneous QHS   insulin aspart  0-9 Units Subcutaneous TID WC   insulin glargine  30 Units Subcutaneous BID   isosorbide mononitrate  60 mg Oral BID   lamoTRIgine  200 mg Oral QHS   lidocaine  1 patch Transdermal Q24H   losartan  25 mg Oral Daily   nicotine  21 mg Transdermal Daily   pantoprazole  40 mg Oral Q1200   polyethylene glycol  17 g Oral Daily   pregabalin  225 mg Oral BID   rosuvastatin  20 mg Oral Daily   senna-docusate  2 tablet  Oral BID   sodium chloride flush  10-40 mL Intracatheter Q12H   spironolactone  25 mg Oral Daily   thiamine  100 mg Oral Daily   torsemide  10 mg Oral Daily   traZODone  50 mg Oral QHS   Vitamin D (Ergocalciferol)  50,000 Units Oral Q7 days   Continuous Infusions:  sodium chloride 10 mL/hr at 07/09/21 1055   ampicillin-sulbactam (UNASYN) IV 3 g (07/09/21 1103)    LOS: 11 days   Kerney Elbe, DO [facility] PAGER is on AMION  If 7PM-7AM, please contact night-coverage [email or web address]

## 2021-07-10 DIAGNOSIS — G44029 Chronic cluster headache, not intractable: Secondary | ICD-10-CM

## 2021-07-10 DIAGNOSIS — F329 Major depressive disorder, single episode, unspecified: Secondary | ICD-10-CM

## 2021-07-10 LAB — CBC WITH DIFFERENTIAL/PLATELET
Abs Immature Granulocytes: 0.31 10*3/uL — ABNORMAL HIGH (ref 0.00–0.07)
Basophils Absolute: 0 10*3/uL (ref 0.0–0.1)
Basophils Relative: 0 %
Eosinophils Absolute: 0.5 10*3/uL (ref 0.0–0.5)
Eosinophils Relative: 4 %
HCT: 39 % (ref 36.0–46.0)
Hemoglobin: 12.9 g/dL (ref 12.0–15.0)
Immature Granulocytes: 2 %
Lymphocytes Relative: 23 %
Lymphs Abs: 3.4 10*3/uL (ref 0.7–4.0)
MCH: 30.5 pg (ref 26.0–34.0)
MCHC: 33.1 g/dL (ref 30.0–36.0)
MCV: 92.2 fL (ref 80.0–100.0)
Monocytes Absolute: 0.8 10*3/uL (ref 0.1–1.0)
Monocytes Relative: 5 %
Neutro Abs: 9.8 10*3/uL — ABNORMAL HIGH (ref 1.7–7.7)
Neutrophils Relative %: 66 %
Platelets: 335 10*3/uL (ref 150–400)
RBC: 4.23 MIL/uL (ref 3.87–5.11)
RDW: 13.8 % (ref 11.5–15.5)
WBC: 14.8 10*3/uL — ABNORMAL HIGH (ref 4.0–10.5)
nRBC: 0 % (ref 0.0–0.2)

## 2021-07-10 LAB — COMPREHENSIVE METABOLIC PANEL
ALT: 23 U/L (ref 0–44)
AST: 20 U/L (ref 15–41)
Albumin: 3.1 g/dL — ABNORMAL LOW (ref 3.5–5.0)
Alkaline Phosphatase: 53 U/L (ref 38–126)
Anion gap: 8 (ref 5–15)
BUN: 12 mg/dL (ref 6–20)
CO2: 31 mmol/L (ref 22–32)
Calcium: 9.9 mg/dL (ref 8.9–10.3)
Chloride: 98 mmol/L (ref 98–111)
Creatinine, Ser: 0.83 mg/dL (ref 0.44–1.00)
GFR, Estimated: >60 mL/min (ref >60)
Glucose, Bld: 83 mg/dL (ref 70–99)
Potassium: 3.7 mmol/L (ref 3.5–5.1)
Sodium: 137 mmol/L (ref 135–145)
Total Bilirubin: 0.5 mg/dL (ref 0.3–1.2)
Total Protein: 6.8 g/dL (ref 6.5–8.1)

## 2021-07-10 LAB — GLUCOSE, CAPILLARY
Glucose-Capillary: 81 mg/dL (ref 70–99)
Glucose-Capillary: 119 mg/dL — ABNORMAL HIGH (ref 70–99)
Glucose-Capillary: 93 mg/dL (ref 70–99)
Glucose-Capillary: 98 mg/dL (ref 70–99)

## 2021-07-10 LAB — PHOSPHORUS: Phosphorus: 3.6 mg/dL (ref 2.5–4.6)

## 2021-07-10 LAB — MAGNESIUM: Magnesium: 1.9 mg/dL (ref 1.7–2.4)

## 2021-07-10 MED ORDER — CYPROHEPTADINE HCL 4 MG PO TABS
4.0000 mg | ORAL_TABLET | Freq: Three times a day (TID) | ORAL | Status: DC | PRN
  Administered 2021-07-10 – 2021-07-16 (×3): 4 mg via ORAL
  Filled 2021-07-10 (×2): qty 1

## 2021-07-10 MED ORDER — POLYETHYLENE GLYCOL 3350 17 G PO PACK
17.0000 g | PACK | Freq: Two times a day (BID) | ORAL | Status: DC
  Administered 2021-07-11 – 2021-07-31 (×18): 17 g via ORAL
  Filled 2021-07-10 (×39): qty 1

## 2021-07-10 NOTE — Progress Notes (Signed)
Speech Language Pathology Daily Session Note  Patient Details  Name: SHAREECE BULTMAN MRN: [id number] Date of Birth: 04-Jan-1971  Today's Date: 07/10/2021 SLP Individual Time: 1230-1330 SLP Individual Time Calculation (min): 60 min  Short Term Goals: Week 2: SLP Short Term Goal 1 (Week 2): Patient will consume current diet with minimal overt s/s of aspiration with Min verbal cues for use of swallowing compensatory strategies. SLP Short Term Goal 2 (Week 2): Patient will utilize speech intelligibility strategies at the sentencen level to achieve ~75% intelligibility with Min verbal cues. SLP Short Term Goal 3 (Week 2): Patient will demonstrate selective attention to functional tasks in a mildly distracting enviornment for 30 minutes with Min A verbal cues for redirection. SLP Short Term Goal 4 (Week 2): Patient will demonstrate functional problem solving during mildly complex tasks with supervision verbal cues. SLP Short Term Goal 5 (Week 2): Patient will demonstrate recall of functional information as it relates to care and safety with supervision verbal cues.  Skilled Therapeutic Interventions:Skilled ST services focused on swallow and cognitive skills. SLP facilitated PO consumption of current diet dys 2 textures and thin liquids via cup, pt required supervision A verbal cues to slow rate, reduce bolus size and limit verbalizations during mastication. SLP took order for dys 3 texture trial tray for tomorrows session. SLP also facilitated reading fluency and comprehension utilizing subsections of Grey Oral Reading Assessment, pt required mod A fading to min A verbal cues to slow rate which impacted comprehension when responding to questions. Pt was able to improve self-correction of errors with return demonstration of finger tracking while reading. Pt began to complete calendar task targeting mildly complex problem solving, error awareness and recall, however pt required mod A fade to min A verbal  cues due continued fast reading rate impacting accuracy. SLP plans to complete task tomorrow using computer to better reflect home appointment management. Pt was left in room with husband, call bell within reach and chair alarm set. SLP recommends to continue skilled services.     Pain Pain Assessment Pain Score: 0-No pain  Therapy/Group: Individual Therapy  Loriana Samad  [facility] 07/10/2021, 1:38 PM

## 2021-07-10 NOTE — Progress Notes (Signed)
Occupational Therapy Weekly Progress Note  Patient Details  Name: Erika [name] MRN: [id number] Date of Birth: 08-31-1971  Beginning of progress report period: June 29, 2021 End of progress report period: July 10, 2021  Today's Date: 07/10/2021 OT Individual Time: 1004-1105 OT Individual Time Calculation (min): 61 min    Patient has met 3 of 4 short term goals.  Pt currently still presents with LUE and LLE hemiparesis.  She needs overall mod assist for bathing tasks sit to stand as well as toileting.  LB dressing is also mod to max with UB dressing at min assist.  LUE functional use is currently Brunnstrum stage II for the arm and stage I for the hand.  Max hand over hand assistance is needed for implementing as a stabilizer with selfcare tasks at this time.  She continues to complete squat pivot transfers at min assist level with stand pivots at mod to max assist.  Feel progress has been good over the last week with recommendation of continued CIR level therapy for progression to min guard assist level goals overall.    Patient continues to demonstrate the following deficits: muscle weakness and muscle paralysis, impaired timing and sequencing, unbalanced muscle activation, and decreased coordination, decreased awareness, decreased problem solving, decreased safety awareness, and decreased memory, and decreased sitting balance, decreased standing balance, decreased postural control, hemiplegia, and decreased balance strategies and therefore will continue to benefit from skilled OT intervention to enhance overall performance with BADL and Reduce care partner burden.  Patient progressing toward long term goals..  Continue plan of care.  OT Short Term Goals Week 2:  OT Short Term Goal 1 (Week 2): Pt will be able to stand with min A and pull pants over hips with min A. OT Short Term Goal 2 (Week 2): Pt will complete toilet transfers with stand pivot min assist. OT Short Term Goal 3 (Week 2):  Pt will donn a pullover shirt with supervision following hemi techniques.  Skilled Therapeutic Interventions/Progress Updates:    Pt in wheelchair to start session.  Took her down to the therapy gym where she completed stand pivot transfer to the therapy mat at mod assist level.  Worked on LUE weightbearing tasks from seated position with LUE placed on the mat and pt using the RUE to reach for targets on the left side.  Max assist needed for supporting her trunk with the LUE while reaching.  She was able to complete sit to squat transitions as well while placing pegs with the RUE in peg board and weightbearing through the LLE and LUE.  Mod assist needed for initiation of movement and for maintaining balance in the squat position.  Also worked on initiation of rolling in supine with mod demonstrational cueing and min assist for activation of abdominals and positioning of the LUE throughout process.  Max assist needed for sidelying to sitting.  Had her work on activation of the left arm pushing tilted stool forward to target.  She was able to complete 15-20 reps with mod instructional cueing for head and body position while completing the movement.  Finished session with return to the room and NMES placed on the left shoulder for facilitation of sensation and movement.  She was able to tolerate for 30 mins before removal secondary to discomfort.  No redness noted.  Intensity was at level 12 with the settings below.  Pt remained up in the wheelchair with her significant other present and the call button and phone in reach.  [place] Stim One 330 pulse width 35 Hz pulse rate On 8 sec/ off 8 sec Ramp up/ down 2 sec Symmetrical Biphasic wave form  Max intensity 168m at 500 Ohm load   Therapy Documentation Precautions:  Precautions Precautions: Fall Precaution Comments: L hemiplegia Restrictions Weight Bearing Restrictions: No LUE Weight Bearing: Weight bearing as tolerated LLE Weight Bearing: Weight  bearing as tolerated  Pain: Pain Assessment Pain Score: 5  ADL: See Care Tool Section for some details of mobility and selfcare   Therapy/Group: Individual Therapy  Shaquasha Gerstel OTR/L 07/10/2021, 12:33 PM

## 2021-07-10 NOTE — Progress Notes (Signed)
PROGRESS NOTE   Subjective/Complaints:  Down because she hasn't seen a lot of return in her left side. Felt very tired yesterday. Thinks it was from one of her medications for pain which she received late Saturday.  Left leg cool. constipated  ROS: Patient denies fever, rash, sore throat, blurred vision, nausea, vomiting, diarrhea, cough, shortness of breath or chest pain, joint or back pain, headache   Objective:   No results found. Recent Labs    07/09/21 1621 07/10/21 0325  WBC 16.2* 14.8*  HGB 13.5 12.9  HCT 41.3 39.0  PLT 383 335   Recent Labs    07/09/21 0348 07/10/21 0325  NA 138 137  K 4.2 3.7  CL 100 98  CO2 30 31  GLUCOSE 117* 83  BUN 9 12  CREATININE 0.83 0.83  CALCIUM 10.2 9.9    Intake/Output Summary (Last 24 hours) at 07/10/2021 1059 Last data filed at 07/10/2021 0830 Gross per 24 hour  Intake 830 ml  Output --  Net 830 ml         Physical Exam: Vital Signs Blood pressure (!) 108/59, pulse 61, temperature 98.7 F (37.1 C), temperature source Axillary, resp. rate 14, height 5\' 6"  (1.676 m), weight 57.6 kg, SpO2 93 %.      Constitutional: No distress . Vital signs reviewed. HEENT: EOMI, oral membranes moist Neck: supple Cardiovascular: RRR without murmur. No JVD    Respiratory/Chest: CTA Bilaterally without wheezes or rales. Normal effort    GI/Abdomen: BS +, non-tender, non-distended Ext: no clubbing, cyanosis, or edema Psych: pleasant and cooperative, a little downbeat at first  Skin: intact. Left arm/leg sl cool to touch Neurological:    Mental Status: She is alert and oriented to person, place, and time.    Comments: Left facial weakness with mild dysarthria. Able to answer orientation questions and follow commands without difficulty.  0/5 LUE and trace-1/5 prox to 0/5 distally LLE 4/5 on Right side    Assessment/Plan: 1. Functional deficits which require 3+ hours per day of  interdisciplinary therapy in a comprehensive inpatient rehab setting. Physiatrist is providing close team supervision and 24 hour management of active medical problems listed below. Physiatrist and rehab team continue to assess barriers to discharge/monitor patient progress toward functional and medical goals  Care Tool:  Bathing    Body parts bathed by patient: Chest, Abdomen, Front perineal area, Right upper leg, Left upper leg, Face, Left arm, Right arm, Right lower leg, Left lower leg (with long sponge)   Body parts bathed by helper: Right arm, Buttocks, Right lower leg, Left lower leg     Bathing assist Assist Level: Minimal Assistance - Patient > 75%     Upper Body Dressing/Undressing Upper body dressing   What is the patient wearing?: Pull over shirt    Upper body assist Assist Level: Minimal Assistance - Patient > 75%    Lower Body Dressing/Undressing Lower body dressing      What is the patient wearing?: Pants     Lower body assist Assist for lower body dressing: Moderate Assistance - Patient 50 - 74%     Toileting Toileting    Toileting assist Assist for  toileting: Moderate Assistance - Patient 50 - 74%     Transfers Chair/bed transfer  Transfers assist     Chair/bed transfer assist level: Moderate Assistance - Patient 50 - 74%     Locomotion Ambulation   Ambulation assist      Assist level: 2 helpers Assistive device: [name] Max distance: 76ft   Walk 10 feet activity   Assist     Assist level: 2 helpers Assistive device: Walker-Eva   Walk 50 feet activity   Assist Walk 50 feet with 2 turns activity did not occur: Safety/medical concerns  Assist level: Dependent - Patient 0% Assistive device: Lite Gait    Walk 150 feet activity   Assist Walk 150 feet activity did not occur: Safety/medical concerns         Walk 10 feet on uneven surface  activity   Assist Walk 10 feet on uneven surfaces activity did not occur:  Safety/medical concerns         Wheelchair     Assist Will patient use wheelchair at discharge?: Yes Type of Wheelchair: Manual    Wheelchair assist level: Supervision/Verbal cueing Max wheelchair distance: 149ft    Wheelchair 50 feet with 2 turns activity    Assist        Assist Level: Supervision/Verbal cueing   Wheelchair 150 feet activity     Assist          Blood pressure (!) 108/59, pulse 61, temperature 98.7 F (37.1 C), temperature source Axillary, resp. rate 14, height 5\' 6"  (1.676 m), weight 57.6 kg, SpO2 93 %.    Medical Problem List and Plan: 1.  Right pontine stroke             -patient may shower             -ELOS: 8/2             -husband may come in before 8am  -Continue CIR therapies including PT, OT, and SLP   -grounds pass prn 2.  Impaired mobility: Continue Lovenox             -antiplatelet therapy: Continue ASA/Plavix 3. Cluster headaches and migraines: continue abortive oxygen PRN. Oxycodone prn for HA. Discussed outpatient Botox- patient would like to try.   7/16- will try Periactin prn daily and phenergan for nausea associated.   7/17- hasn't tried yet, but is a little better- con't regimen 4. Mood: Team to provide ego support. LCSW to follow for evaluation and support.             -antipsychotic agents: Lamictal daily.  -asked pt to let us know if she needs help with her mood/coping.  5. Neuropsych: This patient is capable of making decisions on her own behalf. 6. Skin/Wound Care: Routine pressure relief measures. 7. Fluids/Electrolytes/Nutrition: Monitor I/O. Check lytes in am. 8. HTN: SBP goal 140-180 range to avoid hypoperfusion of brain. Per husband "acts drunk when BP is low" --Continue Imdur, Coreg, aldactone, Demadex, Per reports 7/14: BP below goal given stenosis: decreased coreg to 3.125mg  BID.  Vitals:   07/10/21 0415 07/10/21 0454  BP: (!) 123/59 (!) 108/59  Pulse: (!) 53 61  Resp: 17 14  Temp: (!) 97.5 F  (36.4 C) 98.7 F (37.1 C)  SpO2: 95% 93%  Avoid hypoperfusion, TRH following BP management. Diuretic stopped but she is +7L since admit--monitor volume closely 9. T2DM: Hgb A1C- 10.7. BS run 200's at home. Dietary education/reinforcement.             --  Continue Lantus  45 units BID with SSI for elevated BS             --7/10: hypoglycemic to 58, decrease Lantus to 35U BID CBG (last 3)  Recent Labs    07/09/21 1654 07/09/21 2016 07/10/21 0655  GLUCAP 124* 138* 81  CBG a little low will reduce lantus to 30U   7/18 cbg's controlled 10. CAD s/p CABG: On ASA, coreg, Cozaar, Demadex, ASA, 11. PAD w/claudication: Has been smoking 1 PPD --quit at admission. Continue Nicotine patch. --On lyrica BID to manage symptoms.   12. Aspiration PNA: CT reviewed, appreciate medicine consult,  -wbcs down to 14.8k 7/18 -unasyn on board since 7/11 13. Chronic cluster headaches, currently well controlled: Managed with thorazine as well as oxycodone and high flow oxygen prn. Decreased oxycodone to TID PRN and edited order to indicate it should be given after therapy as per husband's request- makes her too sedated to tolerate therapy. 7/16- added Periactin prn for associated Sx's-con't oxy and O2.   7/18 may have taken periactin too late 7/16---felt hung over all day Sunday---feeling better today---would still try limit use. Continue periactin for now 14. H/o depression/anxiety: Continue Lamictal, Prozac with Klonopin prn. 15. Chronic insomnia: Managed with home dose trazodone. 16. Dyslipidemia: Trig- 636, direct LDL-99.6, HDL 29, Chol 202. On Zetia--to start Vespc 17. Vitamin B 12 deficiency: B12- 169 (was WNL a year ago) 18. NASH/Fatty liver: LFTs reviewed and normalized 19. Dense left sided hemiplegia: [place] regarding NMES- started trials Friday. They have TENS unit at home.  20. Hypokalemia: supplement 20meq 7/7 and repeat BMP reviewed, K+ improved to 3.8. Resolved. D/c daily supplement.  21.  Leukocytosis: trending upward potentially secondary to aspiration pneumonia  7/18 still some elevation today   -afebrile, no s/s on exam   -monitor serially   -hospitalist following 22. Vitamin D deficiency: start ergocalciferol 50,000U weekly for 7 weeks.  23.  Hypoxia ,improved  was not on O2 at admission, has been treated for asp PNA with IV abx for several days and WBC are normalizing.  ? Fluid overload from IV abx, crackles at bases but recent ECHO looked ok   IM consulting 24. AKI: repeat BMP   25. Poor perfusion of LLE  7/17- per IM, LLE is colder than RLE_ by a lot- and no DP pulse palpable- Vascular said con't current regimen and monitor closely for low perfusion injury. 7/18 leg feels warmer (by comparative accounts). Normal appearing   -recent ABI 0.46  -continue to monitor, hospitalist following too 246. Disposition: Lives with husband and daughter (autistic but high functioning)- husband very involved and has already coordinated with his boss to take time off to care for her. Messaged April to schedule HFU/TC.    LOS: 12 days A FACE TO FACE EVALUATION WAS PERFORMED  Meredith Staggers 07/10/2021, 10:59 AM

## 2021-07-10 NOTE — Progress Notes (Signed)
Physical Therapy Session Note  Patient Details  Name: Erika [name] MRN: [id number] Date of Birth: [birth year]  Today's Date: 07/10/2021 PT Individual Time: 1445-1530 PT Individual Time Calculation (min): 45 min   Short Term Goals: Week 1:  PT Short Term Goal 1 (Week 1): Pt will perform bed mobility with minA. PT Short Term Goal 1 - Progress (Week 1): Met PT Short Term Goal 2 (Week 1): Pt will perform sit to stand transfer with minA. PT Short Term Goal 2 - Progress (Week 1): Met PT Short Term Goal 3 (Week 1): Pt perform bed to chair transfer with minA. PT Short Term Goal 3 - Progress (Week 1): Not met PT Short Term Goal 4 (Week 1): Pt will ambulate x25' with modA. PT Short Term Goal 4 - Progress (Week 1): Not met Week 2:  PT Short Term Goal 1 (Week 2): Pt will complete bed mobility with CGA PT Short Term Goal 2 (Week 2): Pt will complete bed<>chair transfers with minA PT Short Term Goal 3 (Week 2): Pt will ambulate 57ft with modA of 1 person and LRAD Week 3:     Skilled Therapeutic Interventions/Progress Updates:   Pain:  Pt reports no pain.  Treatment to tolerance.  Rest breaks and repositioning as needed.  Pt initially sleeping, slow to awaken from deep sleep and agreeable to treatment session w/focus on functional mobility.  Pt w/poor technique when attempting to transition supine to sit, redirected and performs side to sit w/mod assist.  Scoots in sitting w/cues, cga for safety.  Husband assisted by donning shoes and socks.  Pt again demonstrates poor safety awareness w/technique w/squatpivot transfer and redirected to stand pivot transfer, slowed pace, safety.  L wrist splint donned.  Once in wc, transported to gym for continued session.  At hallway rail w/L DF acewrap assist: Gait 50ft w/tendency to lean heavily to R toward wall, cues to increase step width LLE, increased reliance on trunk extension to initiate swing on L due to decreased hip/knee/ankle flexion, tendency to  hyperextend L knee w/midstance for stability, cues to achieve trailing limb L via progression of hip over foot at midstance to terminal stance, cues for reasonable vs excessive step length on R.  Wc follow for safety.  Gait 44ft w/[place] Rile w/mod assist, deviations as above, difficulty stabilizing walker, limited by LE fatigue and mild R hip pain.  Wc to bed stand pivot w/max cues for safety, slowed pace, sequencing.   Sit to supine w/mod assist.   Pt left supine w/rails up x 4, alarm set, bed in lowest position, and needs in reach.    Therapy Documentation Precautions:  Precautions Precautions: Fall Precaution Comments: L hemiplegia Restrictions Weight Bearing Restrictions: No LUE Weight Bearing: Weight bearing as tolerated LLE Weight Bearing: Weight bearing as tolerated     Therapy/Group: Individual Therapy Callie Fielding, [place] 07/10/2021, 4:01 PM

## 2021-07-10 NOTE — Progress Notes (Signed)
Occupational Therapy Session Note  Patient Details  Name: Erika [name] MRN: [id number] Date of Birth: 1970/12/30  Today's Date: 07/10/2021 OT Individual Time: 1594-5859 OT Individual Time Calculation (min): 30 min    Short Term Goals: Week 1:  OT Short Term Goal 1 (Week 1): Pt will be able to complete toilet transfer squat pivot with min A. OT Short Term Goal 2 (Week 1): Pt will be able to stand with min A and pull pants over hips with min A. OT Short Term Goal 3 (Week 1): Pt will be able to don shirt with min A. OT Short Term Goal 4 (Week 1): Pt will be able to self mobilize LUE.  Skilled Therapeutic Interventions/Progress Updates:    Pt seated on [place] upon arrival with NT present. Pt returned to w/c and washed face at sink seated in w/c. Pt transitioned to Day Room and engaged in table task with focus on LUE active movement. Pt required max verbal cues to attend to LUE and place in safe place. Pt with trace horizontal abduction/adduction but unable to perform consistently with LUE on UE skate board. Pt returned to room and remained in w/c with all needs within reach. LUE placed in arm trough. Belt alarm activated. All needs within reach.  Therapy Documentation Precautions:  Precautions Precautions: Fall Precaution Comments: L hemiplegia Restrictions Weight Bearing Restrictions: Yes LUE Weight Bearing: Weight bearing as tolerated LLE Weight Bearing: Weight bearing as tolerated  Pain: Pt denies pain this morning  Therapy/Group: Individual Therapy  Leroy Libman 07/10/2021, 10:29 AM

## 2021-07-10 NOTE — Progress Notes (Signed)
Upon initial rounds patient aroused when name is called, resting comfortably in semi fowlers position denies pain or discomfort, "states she is feeling better today", Respiration unlabored on room air,Lung sounds diminished especially in lower bases bilaterally, repositioned, HOB elevated and pillow readjusted for comfort, coloration adequate.encourage deep breathing and encourage patient to use her incentive spirometer, placed at bedside. Monitored and assisted, up to BR with assigned NT Stanton Kidney, NT).Placed and patient informed of time toeitling Q2hrs and prn,Continue to monitor and assist, call bell placed on right side of bed, due to patient UE/LE deficits,.Encourage po 's   0630 Notified by spouse to inquire about update over night, provided information. Stating he will be in today around 830-930 am, patient was informed. Continue to monitor personal care was provided at this time by  assigned Nurse and NT

## 2021-07-10 NOTE — Plan of Care (Signed)
  Problem: Consults Goal: RH STROKE PATIENT EDUCATION Description: See Patient Education module for education specifics  Outcome: Progressing   Problem: RH BOWEL ELIMINATION Goal: RH STG MANAGE BOWEL WITH ASSISTANCE Description: STG Manage Bowel with mod I Assistance. Outcome: Progressing Goal: RH STG MANAGE BOWEL W/MEDICATION W/ASSISTANCE Description: STG Manage Bowel with Medication with mod i Assistance. Outcome: Progressing   Problem: RH SAFETY Goal: RH STG ADHERE TO SAFETY PRECAUTIONS W/ASSISTANCE/DEVICE Description: STG Adhere to Safety Precautions With cues/reminders Assistance/Device. Outcome: Progressing   Problem: RH COGNITION-NURSING Goal: RH STG USES MEMORY AIDS/STRATEGIES W/ASSIST TO PROBLEM SOLVE Description: STG Uses Memory Aids/Strategies With cues/reminders Assistance to Problem Solve. Outcome: Progressing   Problem: RH PAIN MANAGEMENT Goal: RH STG PAIN MANAGED AT OR BELOW PT'S PAIN GOAL Description: At or below level 4 Outcome: Progressing   Problem: RH KNOWLEDGE DEFICIT Goal: RH STG INCREASE KNOWLEDGE OF DIABETES Description: Patient will be able to manage DM with medications and dietary modifications using handouts and educational tools independently Outcome: Progressing Goal: RH STG INCREASE KNOWLEDGE OF HYPERTENSION Description: Patient will be able to manage HTN with medications and dietary modifications using handouts and educational tools independently Outcome: Progressing Goal: RH STG INCREASE KNOWLEGDE OF HYPERLIPIDEMIA Description: Patient will be able to manage HLD with medications and dietary modifications using handouts and educational tools independently Outcome: Progressing Goal: RH STG INCREASE KNOWLEDGE OF STROKE PROPHYLAXIS Description: Patient will be able to manage secondary stroke risks with medications and dietary modifications using handouts and educational tools independently Outcome: Progressing   Problem: RH KNOWLEDGE  DEFICIT Goal: RH STG INCREASE KNOWLEDGE OF DYSPHAGIA/FLUID INTAKE Description: Patient will be able to manage Dysphagia , medications and dietary modifications using handouts and educational tools independently Outcome: Progressing

## 2021-07-10 NOTE — Progress Notes (Signed)
PROGRESS NOTE    Erika [name]  DGL:[id number] DOB: [birth year] DOA: 06/28/2021 PCP: Lesleigh Noe, MD   Brief Narrative:  Patient is a 50 year old Caucasian female who was hospitalized from 06/24/21 until 06/28/2021 for acute CVA.  Her hospital course was complicated by hypoxia and sepsis due to aspiration event she is ultimately discharged to CIR.  While she was at [facility] noted to have further hypoxia and evaluation revealed recurrent aspiration pneumonia.  TRH was consulted by evaluation by the CIR MD. she has been placed on IV Unasyn and her leukocytosis is improving as well as her respiratory status.  She is no longer hypoxic and will need an ambulatory home O2 screen prior to discharge.  We will repeat chest x-ray today still pending to be done.  She will also have a repeat swallow study done and she has now been placed on a dysphagia 2 diet.  IVF Hydration now stopped. WBC is slowly trending up and went to 16.2 but now trending down. Patient states that her WBC is intermittently and chronically elevated in the past dating back to 2015. Recommend outpatient Hematology consult. She is stable from a Medical Perspective and will Sign off.   Assessment & Plan:   Principal Problem:   Right pontine stroke ([place])  Acute Respiratory Failure with Hypoxia due to likely recurrent aspiration pneumonia -She was hypoxic but hypoxia is resolved -SpO2: 93 % O2 Flow Rate (L/min): 3 L/min; no longer wearing 3 L of supplemental oxygen via nasal cannula and saturating well on ROOM Air -Agree with IV Unasyn and will continue to Monitor Respiratory Status Carefully; Recommending Stopping IV Unasyn on 07/10/21 -She has now been placed on a Dysphagia 2 diet. -We will add flutter valve, incentive spirometry and change Robitussin-DM to the schedule guaifenesin 6 mg p.o. twice daily and leave the Robitussin-DM 5 to 10 mL every 6 hours as needed cough as necessary -Was Getting gentle IVF hydration with NS at 75 mL/hr  but now stopped -WBC was trending down and went from 21.2 -> 13.3 -> 12.5 -> 12.0. Now is trending up slolwy and went from 12.0 -> 12.3 -> 13.3 -> 16.2 -> 14.8 but Respiratory Symptoms have improved and upon further review she has had a Chronic Leukocytosis since 2015; Recommend outpatient Hematology Follow up for further investigation as it is within her Norm -Continue with albuterol 3 mL nebulized treatments every 4 hours as needed for wheezing or shortness of breath -Repeat CXR on 7./16/22 AM showed "Regressed but not fully resolved streaky opacity at the right lung base since 07/03/2021 compatible with regression of infection and/or atelectasis. No new cardiopulmonary abnormality." -Will need an Ambulatory Home O2 Screen prior to Discharging   Recent CVA-with left-sided hemiplegia/dysarthria PAD with Claudication s/p Stenting  -Continue Aspirin 81 mg p.o. daily as well as Clopidogrel 75 mg p.o. daily per neurology recommendations -Patient is on a Statin as below -Continue rehabilitative efforts with Aggressive PT/OT  -On exam her Left Extremity is much colder than her Right. Difficulty obtaining Pedal Pulse. No evidence of Tissue loss and Husband states she doesn't complain of any pain or any rest pain. -She sees Dr. Donnetta Hutching as an outpatient; Spoke with Dr. Carlis Abbott of Vascular today who recommended observation and no ABI's as well as outpatient follow up as she has no rest pain, numbness, tissue loss or discoloration; Recommend following up as an outpatient with Dr. Donnetta Hutching or if she develops symptoms or concern for Acute Limb Ischemia recommending consulting Vascular formally  Constipation -C/w Bisacodyl 10 mg RC Dailyprn Moderate Constipation and Miralax 17 grams po Daily and 17 gram Dailyprn Mild Constipation; Also has Sorbitol 70% solution 30 mL and was ordered Sorbitol 70$ solution 25 mg po Daily -Also has Senna-Docusate 2 tab po BID -Had a very large bowel movement per Husband the day before  yesterday    HLD -Continue Rosuvastatin 20 g p.o. daily as well as Ezetimibe 10 mg p.o. daily  Tobacco Abuse -Smoking cessation counseling given -Continue nicotine patch 20 mg transdermally every 24 hours   HTN -BP stable and on on the lower side today at 108/59 -Continue Losartan 25 mg po Daily, Carvedilol 3.125 mg po BID, Isosorbide Mononitrate 60 mg po BID, Spironolactone 25 mg po Daily, -Agree with decreasing Torsemide to 10 mg po Daily    History of CAD-s/p CABG Chronic Diastolic Dsyfunction -No anginal symptoms -On dual antiplatelet and Statin as above -Also has NTG SL 0.4 mg q29min pRN [place] -Continue to Monitor Volume Status carefully as she was getting IVF. No longer on IVF   Depression/Anxiety/PTSD -C/w Benztropine 0.5 mg po qHS -Chlorpromazine 100 mg po qHS -C/w Clonazepam 0.5 mg po BIDprn Anxiety/Agitation/Panic -C/w Fluoxetine 60 mg po Daily  -C/w Lamtorigine 200 mg po qHS  Thrombocytosis -Likely Reactive and resolved -Platelet Count went from 354 -> 405 -> 397 -> 383 -> 335 -Continue to Monitor and Trend   GERD -C/w PPI with Pantoprazole 40 mg po Daily    DM-2 -CBGs seem adequately well controlled -Continue 33 units of Lantus BID and Sensitive Novolog SSI AC/HS -Continue to monitor CBG's per protocol; CBGs ranging from 81-138; Blood Sugar this AM on CMP was 83   Obesity -Weight loss and Dietary Counseling given -BMI is not accurately documented  -Nutritionist involved and patient on a Dysphagia 2 Diet   DVT prophylaxis: Enoxaparin 40 mg sq q24h Code Status: FULL CODE Family Communication: Discussed with Husband at bedside  Disposition Plan: Per Primary Team; TRH will sign off the case as she is Medically stable and because Abx are completing today  Consultants:  TRH  Procedures: None  Antimicrobials:  Anti-infectives (From admission, onward)    Start     Dose/Rate Route Frequency Ordered Stop   07/03/21 1930  Ampicillin-Sulbactam (UNASYN)  3 g in sodium chloride 0.9 % 100 mL IVPB        3 g 200 mL/hr over 30 Minutes Intravenous Every 6 hours 07/03/21 1838     07/03/21 0400  vancomycin (VANCOREADY) IVPB 750 mg/150 mL  Status:  Discontinued        750 mg 150 mL/hr over 60 Minutes Intravenous Every 12 hours 07/02/21 1426 07/03/21 0533   07/02/21 1600  ceFEPIme (MAXIPIME) 2 g in sodium chloride 0.9 % 100 mL IVPB  Status:  Discontinued        2 g 200 mL/hr over 30 Minutes Intravenous Every 8 hours 07/02/21 1353 07/03/21 0533   07/02/21 1400  vancomycin (VANCOREADY) IVPB 1250 mg/250 mL        1,250 mg 166.7 mL/hr over 90 Minutes Intravenous  Once 07/02/21 1312 07/02/21 1819   06/28/21 1500  Ampicillin-Sulbactam (UNASYN) 3 g in sodium chloride 0.9 % 100 mL IVPB  Status:  Discontinued        3 g 200 mL/hr over 30 Minutes Intravenous Every 6 hours 06/28/21 1321 07/02/21 1353        Subjective: Seen and examined at bedside and is working with occupational therapy.  She feels  well from a respiratory perspective and denies any chest pain, shortness of breath, lightheadedness or dizziness.  She denies any complaints with her foot and just thinks it is colder but no active pain, numbness.  No other concerns or complaints at this time and after further discussion with the patient she has had a chronic leukocytosis in the past.  She appears medically stable and will sign off her case.  Please do not hesitate to consult [name] back for any further questions or concerns.  Objective: Vitals:   07/09/21 1540 07/09/21 1947 07/10/21 0415 07/10/21 0454  BP: (!) 147/69 (!) 145/71 (!) 123/59 (!) 108/59  Pulse: 66 64 (!) 53 61  Resp: 17 16 17 14   Temp: 98.1 F (36.7 C) 98.3 F (36.8 C) (!) 97.5 F (36.4 C) 98.7 F (37.1 C)  TempSrc: Oral Oral Oral Axillary  SpO2: 94% 92% 95% 93%  Weight:      Height:        Intake/Output Summary (Last 24 hours) at 07/10/2021 1230 Last data filed at 07/10/2021 0830 Gross per 24 hour  Intake 830 ml  Output --   Net 830 ml    Filed Weights   06/28/21 1313  Weight: 57.6 kg   Examination: Physical Exam:  Constitutional: WN/WD obese Caucasian female in NAD appears calm and working with OT Eyes: Lids and conjunctivae normal, sclerae anicteric  ENMT: External Ears, Nose appear normal. Grossly normal hearing.   Neck: Appears normal, supple, no cervical masses, normal ROM, no appreciable thyromegaly; no JVD Respiratory: Diminished to auscultation bilaterally with very mildly coarse breath sounds, no wheezing, rales, rhonchi or crackles. Normal respiratory effort and patient is not tachypenic. No accessory muscle use. Unlabored breathing  Cardiovascular: RRR, no murmurs / rubs / gallops. S1 and S2 auscultated. Mild LE Edema and Left leg slightly colder than right Abdomen: Soft, non-tender, Distended 2/2 body habitus. Bowel sounds positive.  GU: Deferred. Musculoskeletal: No clubbing / cyanosis of digits/nails. Normal strength and muscle tone.  Skin: No rashes, lesions, ulcers on a limited skin evaluation. No induration; Warm and dry.  Neurologic: CN 2-12 grossly intact with no focal deficits. Romberg sign and cerebellar reflexes not assessed.  Psychiatric: Normal judgment and insight. Alert and oriented x 3. Normal mood and appropriate affect.   Data Reviewed: I have personally reviewed following labs and imaging studies  CBC: Recent Labs  Lab 07/06/21 1043 07/07/21 0951 07/08/21 1100 07/09/21 1621 07/10/21 0325  WBC 12.0* 12.3* 13.3* 16.2* 14.8*  NEUTROABS 8.5* 8.5* 9.5* 12.2* 9.8*  HGB 12.4 13.1 13.0 13.5 12.9  HCT 37.9 40.5 39.5 41.3 39.0  MCV 91.8 92.7 91.9 92.2 92.2  PLT 405* 397 383 383 220    Basic Metabolic Panel: Recent Labs  Lab 07/06/21 1043 07/07/21 0951 07/08/21 1100 07/09/21 0348 07/10/21 0325  NA 137 138 136 138 137  K 4.3 4.3 4.2 4.2 3.7  CL 102 100 98 100 98  CO2 25 30 30 30 31   GLUCOSE 134* 128* 175* 117* 83  BUN 13 10 12 9 12   CREATININE 0.80 0.79 0.86 0.83  0.83  CALCIUM 9.9 10.2 10.0 10.2 9.9  MG 1.9 1.9 1.8 2.0 1.9  PHOS 3.8 3.9 4.3 3.8 3.6    GFR: Estimated Creatinine Clearance: 76.2 mL/min (by C-G formula based on SCr of 0.83 mg/dL). Liver Function Tests: Recent Labs  Lab 07/06/21 1043 07/07/21 0951 07/08/21 1100 07/09/21 0348 07/10/21 0325  AST 22 20 22 20 20   ALT 26 24 23  25 23  ALKPHOS 58 59 55 57 53  BILITOT 0.4 0.4 0.6 0.3 0.5  PROT 6.5 6.8 6.8 7.2 6.8  ALBUMIN 3.1* 3.2* 3.2* 3.2* 3.1*    No results for input(s): LIPASE, AMYLASE in the last 168 hours. Recent Labs  Lab 07/03/21 1454  AMMONIA 38*    Coagulation Profile: No results for input(s): INR, PROTIME in the last 168 hours. Cardiac Enzymes: No results for input(s): CKTOTAL, CKMB, CKMBINDEX, TROPONINI in the last 168 hours. BNP (last 3 results) No results for input(s): PROBNP in the last 8760 hours. HbA1C: No results for input(s): HGBA1C in the last 72 hours. CBG: Recent Labs  Lab 07/09/21 1142 07/09/21 1654 07/09/21 2016 07/10/21 0655 07/10/21 1200  GLUCAP 116* 124* 138* 81 119*    Lipid Profile: No results for input(s): CHOL, HDL, LDLCALC, TRIG, CHOLHDL, LDLDIRECT in the last 72 hours. Thyroid Function Tests: No results for input(s): TSH, T4TOTAL, FREET4, T3FREE, THYROIDAB in the last 72 hours. Anemia Panel: No results for input(s): VITAMINB12, FOLATE, FERRITIN, TIBC, IRON, RETICCTPCT in the last 72 hours. Sepsis Labs: No results for input(s): PROCALCITON, LATICACIDVEN in the last 168 hours.   Recent Results (from the past 240 hour(s))  Culture, Urine     Status: None   Collection Time: 07/03/21  9:14 AM   Specimen: Urine, Random  Result Value Ref Range Status   Specimen Description URINE, RANDOM  Final   Special Requests NONE  Final   Culture   Final    NO GROWTH Performed at [facility], [address] [place], [place] [address]    Report Status 07/04/2021 FINAL  Final   RN Pressure Injury Documentation:     Estimated  body mass index is 20.5 kg/m as calculated from the following:   Height as of this encounter: 5\' 6"  (1.676 m).   Weight as of this encounter: 57.6 kg.  Malnutrition Type:   Malnutrition Characteristics:   Nutrition Interventions:    Radiology Studies: No results found.  Scheduled Meds:  aspirin  81 mg Oral Daily   benztropine  0.5 mg Oral QHS   carvedilol  3.125 mg Oral BID WC   chlorproMAZINE  100 mg Oral QHS   clopidogrel  75 mg Oral Daily   cyanocobalamin  1,000 mcg Subcutaneous Q30 days   enoxaparin (LOVENOX) injection  40 mg Subcutaneous Q24H   ezetimibe  10 mg Oral Daily   FLUoxetine  60 mg Oral Daily   guaiFENesin  600 mg Oral BID   hydrocortisone cream   Topical BID   insulin aspart  0-5 Units Subcutaneous QHS   insulin aspart  0-9 Units Subcutaneous TID WC   insulin glargine  30 Units Subcutaneous BID   isosorbide mononitrate  60 mg Oral BID   lamoTRIgine  200 mg Oral QHS   lidocaine  1 patch Transdermal Q24H   losartan  25 mg Oral Daily   nicotine  21 mg Transdermal Daily   pantoprazole  40 mg Oral Q1200   polyethylene glycol  17 g Oral BID   pregabalin  225 mg Oral BID   rosuvastatin  20 mg Oral Daily   senna-docusate  2 tablet Oral BID   sodium chloride flush  10-40 mL Intracatheter Q12H   spironolactone  25 mg Oral Daily   thiamine  100 mg Oral Daily   torsemide  10 mg Oral Daily   traZODone  50 mg Oral QHS   Vitamin D (Ergocalciferol)  50,000 Units Oral  Q7 days   Continuous Infusions:  sodium chloride Stopped (07/10/21 1229)   ampicillin-sulbactam (UNASYN) IV Stopped (07/10/21 1229)    LOS: 12 days   Kerney Elbe, DO [facility] PAGER is on [place]  If 7PM-7AM, please contact night-coverage [email or web address]

## 2021-07-11 LAB — GLUCOSE, CAPILLARY
Glucose-Capillary: 74 mg/dL (ref 70–99)
Glucose-Capillary: 142 mg/dL — ABNORMAL HIGH (ref 70–99)
Glucose-Capillary: 110 mg/dL — ABNORMAL HIGH (ref 70–99)
Glucose-Capillary: 259 mg/dL — ABNORMAL HIGH (ref 70–99)

## 2021-07-11 MED ORDER — DICLOFENAC SODIUM 1 % EX GEL
2.0000 g | Freq: Four times a day (QID) | CUTANEOUS | Status: DC
  Administered 2021-07-11 – 2021-07-23 (×24): 2 g via TOPICAL
  Filled 2021-07-11: qty 100

## 2021-07-11 NOTE — Progress Notes (Signed)
PROGRESS NOTE   Subjective/Complaints: WBC trending down yesterday In positive mood and appearing bright today C/o pain in left shoulder still- lidocaine patch did not help. Will discontinue and try voltaren gel.   ROS: Patient denies fever, rash, sore throat, blurred vision, nausea, vomiting, diarrhea, cough, shortness of breath or chest pain, joint or back pain, headache, +;eft shoulder pain  Objective:   No results found. Recent Labs    07/09/21 1621 07/10/21 0325  WBC 16.2* 14.8*  HGB 13.5 12.9  HCT 41.3 39.0  PLT 383 335   Recent Labs    07/09/21 0348 07/10/21 0325  NA 138 137  K 4.2 3.7  CL 100 98  CO2 30 31  GLUCOSE 117* 83  BUN 9 12  CREATININE 0.83 0.83  CALCIUM 10.2 9.9    Intake/Output Summary (Last 24 hours) at 07/11/2021 1022 Last data filed at 07/10/2021 1856 Gross per 24 hour  Intake 237 ml  Output --  Net 237 ml         Physical Exam: Vital Signs Blood pressure 103/73, pulse 61, temperature (!) 97.4 F (36.3 C), temperature source Oral, resp. rate 16, height 5\' 6"  (1.676 m), weight 57.6 kg, SpO2 96 %. Gen: no distress, normal appearing HEENT: oral mucosa pink and moist, NCAT Cardio: Reg rate Chest: normal effort, normal rate of breathing Abd: soft, non-distended Ext: no edema Psych: pleasant, normal affect Musculoskeletal:  Skin: intact. Left arm/leg sl cool to touch Neurological:    Mental Status: She is alert and oriented to person, place, and time.    Comments: Left facial weakness with mild dysarthria. Able to answer orientation questions and follow commands without difficulty.  0/5 LUE and trace-1/5 prox to 0/5 distally LLE 4/5 on Right side    Assessment/Plan: 1. Functional deficits which require 3+ hours per day of interdisciplinary therapy in a comprehensive inpatient rehab setting. Physiatrist is providing close team supervision and 24 hour management of active medical  problems listed below. Physiatrist and rehab team continue to assess barriers to discharge/monitor patient progress toward functional and medical goals  Care Tool:  Bathing    Body parts bathed by patient: Chest, Abdomen, Front perineal area, Right upper leg, Left upper leg, Face, Left arm, Right arm, Right lower leg, Left lower leg (with long sponge)   Body parts bathed by helper: Right arm, Buttocks, Right lower leg, Left lower leg     Bathing assist Assist Level: Moderate Assistance - Patient 50 - 74%     Upper Body Dressing/Undressing Upper body dressing   What is the patient wearing?: Pull over shirt    Upper body assist Assist Level: Moderate Assistance - Patient 50 - 74%    Lower Body Dressing/Undressing Lower body dressing      What is the patient wearing?: Pants     Lower body assist Assist for lower body dressing: Maximal Assistance - Patient 25 - 49%     Toileting Toileting    Toileting assist Assist for toileting: Minimal Assistance - Patient > 75%     Transfers Chair/bed transfer  Transfers assist     Chair/bed transfer assist level: Moderate Assistance - Patient 50 - 74%  Locomotion Ambulation   Ambulation assist      Assist level: 2 helpers Assistive device: Hand held assist Max distance: 20'   Walk 10 feet activity   Assist     Assist level: 2 helpers Assistive device: Hand held assist   Walk 50 feet activity   Assist Walk 50 feet with 2 turns activity did not occur: Safety/medical concerns  Assist level: Dependent - Patient 0% Assistive device: Lite Gait    Walk 150 feet activity   Assist Walk 150 feet activity did not occur: Safety/medical concerns         Walk 10 feet on uneven surface  activity   Assist Walk 10 feet on uneven surfaces activity did not occur: Safety/medical concerns         Wheelchair     Assist Will patient use wheelchair at discharge?: No Type of Wheelchair: Manual     Wheelchair assist level: Supervision/Verbal cueing Max wheelchair distance: 134ft    Wheelchair 50 feet with 2 turns activity    Assist        Assist Level: Supervision/Verbal cueing   Wheelchair 150 feet activity     Assist          Blood pressure 103/73, pulse 61, temperature (!) 97.4 F (36.3 C), temperature source Oral, resp. rate 16, height 5\' 6"  (1.676 m), weight 57.6 kg, SpO2 96 %.    Medical Problem List and Plan: 1.  Right pontine stroke             -patient may shower             -ELOS: 8/2             -husband may come in before 8am  -Continue CIR therapies including PT, OT, and SLP   -grounds pass prn 2.  Impaired mobility: Continue Lovenox             -antiplatelet therapy: Continue ASA/Plavix 3. Cluster headaches and migraines: continue abortive oxygen PRN. Oxycodone prn for HA. Discussed outpatient Botox- patient would like to try.   7/16- will try Periactin prn daily and phenergan for nausea associated.   7/17- hasn't tried yet, but is a little better- con't regimen 4. Mood: Team to provide ego support. LCSW to follow for evaluation and support.             -antipsychotic agents: Lamictal daily.  -asked pt to let us know if she needs help with her mood/coping.  5. Neuropsych: This patient is capable of making decisions on her own behalf. 6. Skin/Wound Care: Routine pressure relief measures. 7. Fluids/Electrolytes/Nutrition: Monitor I/O. Check lytes in am. 8. HTN: SBP goal 140-180 range to avoid hypoperfusion of brain. Per husband "acts drunk when BP is low" --Continue Imdur, Coreg, aldactone, Demadex, Per reports 7/14: BP below goal given stenosis: decreased coreg to 3.125mg  BID.  Vitals:   07/11/21 0541 07/11/21 0629  BP: (!) 114/56 103/73  Pulse: (!) 57 61  Resp: 14 16  Temp: (!) 97.4 F (36.3 C)   SpO2: 96% 96%  Avoid hypoperfusion, TRH following BP management. Diuretic stopped but she is +7L since admit--monitor volume closely 9.  T2DM: Hgb A1C- 10.7. BS run 200's at home. Dietary education/reinforcement.             --Continue Lantus  45 units BID with SSI for elevated BS             --7/10: hypoglycemic to 58, decrease Lantus to 35U BID CBG (  last 3)  Recent Labs    07/10/21 1633 07/10/21 2132 07/11/21 0707  GLUCAP 93 98 74  CBG a little low will reduce lantus to 30U   7/18 cbg's controlled 10. CAD s/p CABG: On ASA, coreg, Cozaar, Demadex, ASA, 11. PAD w/claudication: Has been smoking 1 PPD --quit at admission. Continue Nicotine patch. --On lyrica BID to manage symptoms.   12. Aspiration PNA: CT reviewed, appreciate medicine consult,  -wbcs down to 14.8k 7/18 -unasyn on board since 7/11 13. Chronic cluster headaches, currently well controlled: Managed with thorazine as well as oxycodone and high flow oxygen prn. Decreased oxycodone to TID PRN and edited order to indicate it should be given after therapy as per husband's request- makes her too sedated to tolerate therapy. 7/16- added Periactin prn for associated Sx's-con't oxy and O2.   7/18 may have taken periactin too late 7/16---felt hung over all day Sunday---feeling better today---would still try limit use. Continue periactin for now 14. H/o depression/anxiety: Continue Lamictal, Prozac with Klonopin prn. 15. Chronic insomnia: Managed with home dose trazodone. 16. Dyslipidemia: Trig- 636, direct LDL-99.6, HDL 29, Chol 202. On Zetia--to start Vespc 17. Vitamin B 12 deficiency: B12- 169 (was WNL a year ago) 18. NASH/Fatty liver: LFTs reviewed and normalized 19. Dense left sided hemiplegia: [place] regarding NMES- started trials Friday. They have TENS unit at home.  20. Hypokalemia: supplement 37meq 7/7 and repeat BMP reviewed, K+ improved to 3.8. Resolved. D/c daily supplement.  21. Leukocytosis: trending upward potentially secondary to aspiration pneumonia  7/18 still some elevation today   -afebrile, no s/s on exam   -monitor serially   -hospitalist  following 22. Vitamin D deficiency: start ergocalciferol 50,000U weekly for 7 weeks.  23.  Hypoxia ,improved  was not on O2 at admission, has been treated for asp PNA with IV abx for several days and WBC are normalizing.  ? Fluid overload from IV abx, crackles at bases but recent ECHO looked ok   IM consulting 24. AKI: repeat BMP   25. Poor perfusion of LLE  7/17- per IM, LLE is colder than RLE_ by a lot- and no DP pulse palpable- Vascular said con't current regimen and monitor closely for low perfusion injury. 7/18 leg feels warmer (by comparative accounts). Normal appearing   -recent ABI 0.46  -continue to monitor, hospitalist following too 26. Left post-stroke shoulder pain: d/c lidocaine patch and ordered voltaren gel.  27. Disposition: Lives with husband and daughter (autistic but high functioning)- husband very involved and has already coordinated with his boss to take time off to care for her. Messaged April to schedule HFU/TC.    LOS: 13 days A FACE TO FACE EVALUATION WAS PERFORMED  Erika [name] P Treylan Mcclintock 07/11/2021, 10:22 AM

## 2021-07-11 NOTE — Progress Notes (Signed)
Speech Language Pathology Daily Session Note  Patient Details  Name: Erika [name] MRN: [id number] Date of Birth: Jan 09, 1971  Today's Date: 07/11/2021 SLP Individual Time: 1200-1259 SLP Individual Time Calculation (min): 59 min  Short Term Goals: Week 2: SLP Short Term Goal 1 (Week 2): Patient will consume current diet with minimal overt s/s of aspiration with Min verbal cues for use of swallowing compensatory strategies. SLP Short Term Goal 2 (Week 2): Patient will utilize speech intelligibility strategies at the sentencen level to achieve ~75% intelligibility with Min verbal cues. SLP Short Term Goal 3 (Week 2): Patient will demonstrate selective attention to functional tasks in a mildly distracting enviornment for 30 minutes with Min A verbal cues for redirection. SLP Short Term Goal 4 (Week 2): Patient will demonstrate functional problem solving during mildly complex tasks with supervision verbal cues. SLP Short Term Goal 5 (Week 2): Patient will demonstrate recall of functional information as it relates to care and safety with supervision verbal cues.  Skilled Therapeutic Interventions:Skilled ST services focused on swallow and cognitive skills. SLP facilitated PO consumption of dys 3 textures trial lunch tray, pt required min A fade to supervision A verbal cues to reduce bolus size with no overt aspiration. SLP upgraded diet to dys 3 textures and continue full supervision for likely a short period of time piror to decreasing to intermittent supervision. SLP attempted to finish calendar task on computer calendar, however pt required max A verbal cues due to attention and poor error awareness. Pt demonstrated increase carryover of error awareness in finishing calendar task on pen and paper, requiring mod-min A verbal cues to include important information and problem solve location of appointments over multiple days. However during the task pt continues to demonstrated reduced error  awareness or the need to correct errors only upon reflection, unable to read poor hand witting and short term recall was limited. SLP facilitated organizing thoughts and error awareness in witting task of reflecting on today's PT session, pt required min A verbal cues to recall events, min A fade to supervision A verbal cues to utilize strategy "thinking aloud" to slow rate and read sentence to check for errors. SLP encouraged pt to continue witting down daily events to carryover taught strategies. Pt was left in room with call bell within reach and chair alarm set. SLP recommends to continue skilled services.     Pain Pain Assessment Pain Scale: 0-10 Pain Score: 0-No pain  Therapy/Group: Individual Therapy  [name]  [facility] 07/11/2021, 1:02 PM

## 2021-07-11 NOTE — Progress Notes (Signed)
Occupational Therapy Session Note  Patient Details  Name: Erika [name] MRN: [id number] Date of Birth: October 24, 1971  Today's Date: 07/11/2021 OT Individual Time: 1105-1200 OT Individual Time Calculation (min): 55 min    Short Term Goals: Week 2:  OT Short Term Goal 1 (Week 2): Pt will be able to stand with min A and pull pants over hips with min A. OT Short Term Goal 2 (Week 2): Pt will complete toilet transfers with stand pivot min assist. OT Short Term Goal 3 (Week 2): Pt will donn a pullover shirt with supervision following hemi techniques.  Skilled Therapeutic Interventions/Progress Updates:    Pt sitting up in w/c, no c/o pain currently, reports she already washed up today. Reports she hasnt completed any LUE exercises today, but tired from walking in PT.  Pt completed left shoulder elevation/depression, scapular retraction/protraction AROM and AAROM shoulder ff/ext trace movements 3 x 10 reps.  Pt self propelled to dayroom and participated in dynamic standing task with training on RW use /walker splint use.  Pt required hand over hand assist to grasp walker splint with left and donn strap prior to standing and sitting.  Sit to stand needing min assist with left knee blocked and reach using RUE in various directions and planes then toss bean bags to corn hole board with facilitation of leaning outside BOS with CGA for balance.  Educated pt on safe stance to improve BOS and BLE positioning prior and during sit<>stand transfers due to pt with tendency of keeping BLE close together.  Pt return demonstrated requiring mod Vcs for follow through.    Pt returned to room and kinesiotape applied to left wrist and hand for edema reduction due to mild swelling noted at beginning of session.  Educated pt on self donning/doffing of resting hand splint and pt return demonstrated needing step by step Vcs.  Pt will benefit from further training on orthosis mgt.  Call bell in reach, seat alarm on at end of  session.  Therapy Documentation Precautions:  Precautions Precautions: Fall Precaution Comments: L hemiplegia Restrictions Weight Bearing Restrictions: No LUE Weight Bearing: Weight bearing as tolerated LLE Weight Bearing: Weight bearing as tolerated    Therapy/Group: Individual Therapy  Ezekiel Slocumb 07/11/2021, 1:55 PM

## 2021-07-11 NOTE — Progress Notes (Signed)
Patient ID: Erika [name], female   DOB: 10-15-1971, 50 y.o.   MRN: [id number] Met with pt and husband to update regarding team conference progress this week and discharge still 8/2. Team has discussed extending discharge with progress and will talk more next week during team conference. Both pt and husband want her to be as high level as possible before going home. Aware insurance would need to approve this also. Work on discharge needs. Husband reports they have met 100% out of pocket so all is covered at 100%.

## 2021-07-11 NOTE — Progress Notes (Signed)
Physical Therapy Session Note  Patient Details  Name: Erika [name] MRN: [id number] Date of Birth: [birth year]  Today's Date: 07/11/2021 PT Individual Time: 0900-1000 PT Individual Time Calculation (min): 60 min   Short Term Goals: Week 1:  PT Short Term Goal 1 (Week 1): Pt will perform bed mobility with minA. PT Short Term Goal 1 - Progress (Week 1): Met PT Short Term Goal 2 (Week 1): Pt will perform sit to stand transfer with minA. PT Short Term Goal 2 - Progress (Week 1): Met PT Short Term Goal 3 (Week 1): Pt perform bed to chair transfer with minA. PT Short Term Goal 3 - Progress (Week 1): Not met PT Short Term Goal 4 (Week 1): Pt will ambulate x25' with modA. PT Short Term Goal 4 - Progress (Week 1): Not met Week 2:  PT Short Term Goal 1 (Week 2): Pt will complete bed mobility with CGA PT Short Term Goal 2 (Week 2): Pt will complete bed<>chair transfers with minA PT Short Term Goal 3 (Week 2): Pt will ambulate 61ft with modA of 1 person and LRAD Week 3:     Skilled Therapeutic Interventions/Progress Updates:   Pain:  Pt reports mininmal L shoulder pain.  Treatment to tolerance.  Rest breaks and repositioning as needed. UE supported using gait belt during mobility to prevent traction on shoulder.  Pt initially oob in wc and agreeable to treatment session w/focus on functional gait training, LLE NMRE, wc propulsion. L handsplint donned by therapist. Wc propulsion using hemi technique x 5ft, slow propulsion, cues to attend to people/obstacles on L.  RW set up w/L hand orthosis by therapist.  Sit to stand from wc requires mult efforts and cues to slow down and focus on set up, sequencing, wt shifting w/task.  Gait 74ft w/RW, LDF acewrap assist, mod assist overall,  one occasion max assist due to lob to L, poor clearance LLE due to very limited hip and knee flexion, very narrow base of support, L lean at times w/no ability to recover, no reactions L hemibody but pt aware of  balance loss and verbalizes.  Parallel bars: Sepping forward w/LLE onto 1.5 in step Hip abd   Gait 6ft w/RW w/+2 assist for improved safety, second person assists w/walker management, deviations as above but mod assist only, no balance loss L, cues to maaintain attention to task.  Pt left oob in wc w/alarm belt set and needs in reach   Therapy Documentation Precautions:  Precautions Precautions: Fall Precaution Comments: L hemiplegia Restrictions Weight Bearing Restrictions: No LUE Weight Bearing: Weight bearing as tolerated LLE Weight Bearing: Weight bearing as tolerated    Therapy/Group: Individual Therapy Callie Fielding, [place] 07/11/2021, 12:52 PM

## 2021-07-11 NOTE — Plan of Care (Signed)
  Problem: Consults Goal: RH STROKE PATIENT EDUCATION Description: See Patient Education module for education specifics  Outcome: Progressing   Problem: RH BOWEL ELIMINATION Goal: RH STG MANAGE BOWEL WITH ASSISTANCE Description: STG Manage Bowel with mod I Assistance. Outcome: Progressing Goal: RH STG MANAGE BOWEL W/MEDICATION W/ASSISTANCE Description: STG Manage Bowel with Medication with mod i Assistance. Outcome: Progressing   Problem: RH SAFETY Goal: RH STG ADHERE TO SAFETY PRECAUTIONS W/ASSISTANCE/DEVICE Description: STG Adhere to Safety Precautions With cues/reminders Assistance/Device. Outcome: Progressing   Problem: RH COGNITION-NURSING Goal: RH STG USES MEMORY AIDS/STRATEGIES W/ASSIST TO PROBLEM SOLVE Description: STG Uses Memory Aids/Strategies With cues/reminders Assistance to Problem Solve. Outcome: Progressing   Problem: RH PAIN MANAGEMENT Goal: RH STG PAIN MANAGED AT OR BELOW PT'S PAIN GOAL Description: At or below level 4 Outcome: Progressing   Problem: RH KNOWLEDGE DEFICIT Goal: RH STG INCREASE KNOWLEDGE OF DIABETES Description: Patient will be able to manage DM with medications and dietary modifications using handouts and educational tools independently Outcome: Progressing Goal: RH STG INCREASE KNOWLEDGE OF HYPERTENSION Description: Patient will be able to manage HTN with medications and dietary modifications using handouts and educational tools independently Outcome: Progressing Goal: RH STG INCREASE KNOWLEGDE OF HYPERLIPIDEMIA Description: Patient will be able to manage HLD with medications and dietary modifications using handouts and educational tools independently Outcome: Progressing Goal: RH STG INCREASE KNOWLEDGE OF STROKE PROPHYLAXIS Description: Patient will be able to manage secondary stroke risks with medications and dietary modifications using handouts and educational tools independently Outcome: Progressing   Problem: RH KNOWLEDGE  DEFICIT Goal: RH STG INCREASE KNOWLEDGE OF DYSPHAGIA/FLUID INTAKE Description: Patient will be able to manage Dysphagia , medications and dietary modifications using handouts and educational tools independently Outcome: Progressing

## 2021-07-11 NOTE — Progress Notes (Signed)
Physical Therapy Session Note  Patient Details  Name: Erika [name] MRN: [id number] Date of Birth: 24-Nov-1971  Today's Date: 07/11/2021 PT Individual Time: 1330-1400 PT Individual Time Calculation (min): 30 min   Short Term Goals: Week 2:  PT Short Term Goal 1 (Week 2): Pt will complete bed mobility with CGA PT Short Term Goal 2 (Week 2): Pt will complete bed<>chair transfers with minA PT Short Term Goal 3 (Week 2): Pt will ambulate 45ft with modA of 1 person and LRAD  Skilled Therapeutic Interventions/Progress Updates:    Patient in bathroom with NT upon PT entry.  Spouse in the room and educated on timing for training.  Patient in w/c assisted to hallway and propelled x 70' in hallway with hemi-technique and S.  Patient assisted to ortho gym.  Performed sit to stand to RW with min A.  Standing x 1.5 min x 2 then 2 min to complete reaching task to tap letters A-Z and dots randomly popping up on screen with min to mod A due to LOB x 2 when reaching L.  Patient c/o fatigue on R hip and at times dizziness during dynamic balance activities.  Assisted in chair to room.  Patient requesting to return to supine.  Squat pivot to bed from w/c to L with min A.  Patient sit to supine with A for L LE and spouse in the room to assist with +2 to scoot toward [place].  Patient left in supine with call bell in reach, bed alarm active and spouse in the room.   Therapy Documentation Precautions:  Precautions Precautions: Fall Precaution Comments: L hemiplegia Restrictions Weight Bearing Restrictions: No LUE Weight Bearing: Weight bearing as tolerated LLE Weight Bearing: Weight bearing as tolerated  Pain: Pain Assessment Pain Scale: 0-10 Pain Score: 0-No pain    Therapy/Group: Individual Therapy  Reginia Naas [place], PT 07/11/2021, 1:25 PM

## 2021-07-11 NOTE — Patient Care Conference (Signed)
Inpatient RehabilitationTeam Conference and Plan of Care Update Date: 07/11/2021   Time: 13:04 PM    Patient Name: Erika [name]      Medical Record Number: [id number]  Date of Birth: 03/31/1971 Sex: Female         Room/Bed: 4W19C/4W19C-01 Payor Info: Payor: MEDICARE / Plan: MEDICARE PART A / Product Type: *No Product type* /    Admit Date/Time:  06/28/2021  1:00 PM  Primary Diagnosis:  Right pontine stroke [facility])  Hospital Problems: Principal Problem:   Right pontine stroke [facility])    Expected Discharge Date: Expected Discharge Date: 07/25/21  Team Members Present: Physician leading conference: Dr. Leeroy Cha Care Coodinator Present: Ovidio Kin, LCSW;Krisalyn Yankowski Hervey Ard, RN, BSN, CRRN Nurse Present: Dorien Chihuahua, RN PT Present: Callie Fielding, PT OT Present: Leretha Pol, OT SLP Present: Charolett Bumpers, SLP PPS Coordinator present : Gunnar Fusi, SLP     Current Status/Progress Goal Weekly Team Focus  Bowel/Bladder   Incontinent Of Bowel and Bladder. LBM 07/09/21  Wiill regain Bladder and Bowel Function.      Swallow/Nutrition/ Hydration   dys 3 textures and thin liquids, Supervision A  Mod I  carryover of swallow strategies and regular trials   ADL's   Min assist for UB selfcare with mod assist for LB selfcare.  Mod assist for transfers squat to stand pivot.  LUE currently Brunnstrum stage II in the arm with trace shoulder movements.  No hand movements noted.  contact guard  selfcare retraining, transfer training, neuromuscular re-education NMES, therapeutic exercise, cognitive retraining, pt/family education   Mobility   mod assist w/transfers, mod to max assist gait, wc propulsion w/lots of cueing for safety, scanning L environment  supervision to min assist  gait training w/functional AD, Lhemibody NMR, transfers, safety, wc skills   Communication   Mod A  Mod I  speech intelligibility and awareness of fast rate   Safety/Cognition/ Behavioral Observations   Mod -Min A  Supervision A - Mod I  mildly complex problem solving, error awareness, recall and selective attention   Pain   Acute frontal headaches managed with prescribed Meds.(MAR)  <2/10. Assess pain Qshift and prn.      Skin   Skin Intergrity Intact.  Maintain Intact skin.        Discharge Planning:  Husband here daily and attends therapies with pt, plans on being with at DC and works from home. Aware will need 24/7 care   Team Discussion: Aspiration pna better and headaches managed with scheduled medications. Left shoulder pain addressed with Voltaren gel. Leukocyte level better per MD. Patient is incontinent at times. Progress limited by impulsiveness,  and medical issues; note slow progression towards goals. Patient on target to meet rehab goals: yes, currently min assist for upper body care and mod assist for lower body care. Requires min - mod assist for transfers, squat pivot and stand pivot transfers. Requires mod - max assist for gait.  *See Care Plan and progress notes for long and short-term goals.   Revisions to Treatment Plan:  Working on error awareness, reflexion, sustained attention, safety awareness and swallow strategies  Teaching Needs: Safety, secondary stroke risk management,medications,lack of insight, etc  Current Barriers to Discharge: Decreased caregiver support and Home enviroment access/layout  Possible Resolutions to Barriers: Family education with spouse     Medical Summary Current Status: aspiration pneumonia, left shoulder pain, CBGs well controlled, BP well controlled to soft, cluster headaches and migraines  Barriers to Discharge: Medical stability  Barriers to  Discharge Comments: aspiration pneumonia, left shoulder pain, CBGs well controlled, BP well controlled to soft, cluster headaches and migraines Possible Resolutions to [employer]: continue to monitor WBC, replaced lidocaine patch with voltaren gel, continue insulin and CBG  monitoring, continue oxycodone and benzodizepine, decreaed dose and frequency   Continued Need for Acute Rehabilitation Level of Care: The patient requires daily medical management by a physician with specialized training in physical medicine and rehabilitation for the following reasons: Direction of a multidisciplinary physical rehabilitation program to maximize functional independence : Yes Medical management of patient stability for increased activity during participation in an intensive rehabilitation regime.: Yes Analysis of laboratory values and/or radiology reports with any subsequent need for medication adjustment and/or medical intervention. : Yes   I attest that I was present, lead the team conference, and concur with the assessment and plan of the team.   Dorien Chihuahua B 07/11/2021, 4:56 PM

## 2021-07-12 ENCOUNTER — Inpatient Hospital Stay (HOSPITAL_COMMUNITY): Payer: Managed Care, Other (non HMO)

## 2021-07-12 LAB — GLUCOSE, CAPILLARY
Glucose-Capillary: 54 mg/dL — ABNORMAL LOW (ref 70–99)
Glucose-Capillary: 104 mg/dL — ABNORMAL HIGH (ref 70–99)
Glucose-Capillary: 156 mg/dL — ABNORMAL HIGH (ref 70–99)
Glucose-Capillary: 152 mg/dL — ABNORMAL HIGH (ref 70–99)
Glucose-Capillary: 162 mg/dL — ABNORMAL HIGH (ref 70–99)

## 2021-07-12 MED ORDER — LOSARTAN POTASSIUM 25 MG PO TABS
12.5000 mg | ORAL_TABLET | Freq: Every day | ORAL | Status: DC
  Administered 2021-07-13 – 2021-07-31 (×19): 12.5 mg via ORAL
  Filled 2021-07-12 (×19): qty 0.5

## 2021-07-12 MED ORDER — INSULIN GLARGINE 100 UNIT/ML ~~LOC~~ SOLN
27.0000 [IU] | Freq: Two times a day (BID) | SUBCUTANEOUS | Status: DC
  Administered 2021-07-12 – 2021-08-01 (×40): 27 [IU] via SUBCUTANEOUS
  Filled 2021-07-12 (×42): qty 0.27

## 2021-07-12 NOTE — Progress Notes (Signed)
Occupational Therapy Session Note  Patient Details  Name: Erika [name] MRN: [id number] Date of Birth: 11/19/1971  Today's Date: 07/12/2021 OT Individual Time: 1300-1345 OT Individual Time Calculation (min): 45 min    Short Term Goals: Week 1:  OT Short Term Goal 1 (Week 1): Pt will be able to complete toilet transfer squat pivot with min A. OT Short Term Goal 1 - Progress (Week 1): Met OT Short Term Goal 2 (Week 1): Pt will be able to stand with min A and pull pants over hips with min A. OT Short Term Goal 2 - Progress (Week 1): Not met OT Short Term Goal 3 (Week 1): Pt will be able to don shirt with min A. OT Short Term Goal 3 - Progress (Week 1): Met OT Short Term Goal 4 (Week 1): Pt will be able to self mobilize LUE. OT Short Term Goal 4 - Progress (Week 1): Met  Skilled Therapeutic Interventions/Progress Updates:     Pt received in bed with no pain. MOD A for bed mobility to EOB and MAX A for SPT without AD to R in w/c.    Neuromuscular Reeducation Pt completes towel slides at tableop for NMR with scap input for facilitation of activating shoulder/scap. Ot attempting to use tapping and tactile input for activation of muscles. Shoulder flex/ext, elbow flex/ext, int/ext rotation as well as supination/pronation. Notably trace activation with scapular movements, but no activation noted in bicep/tricep/forearm.  Saebo Stim One 330 pulse width 35 Hz pulse rate On 8 sec/ off 8 sec Ramp up/ down 2 sec Symmetrical Biphasic wave form  Max intensity 110mA at 500 Ohm load Wrist extensors with good input/reaction- pt educated on incoproatng function- squeeze ball when off (thinking about the movement) and let go of ball Skin in tact at end of session.  Pt left at end of session in w/c with exit alarm on, call light in reach and all needs met   Therapy Documentation Precautions:  Precautions Precautions: Fall Precaution Comments: L hemiplegia Restrictions Weight Bearing  Restrictions: No LUE Weight Bearing: Weight bearing as tolerated LLE Weight Bearing: Weight bearing as tolerated General:   Vital Signs: Therapy Vitals Temp: 98.1 F (36.7 C) Temp Source: Oral Pulse Rate: 66 Resp: 18 BP: 127/60 Patient Position (if appropriate): Lying Oxygen Therapy SpO2: 93 % O2 Device: Room Air Pain:   ADL: ADL Eating: Set up Grooming: Setup Upper Body Bathing: Minimal assistance Lower Body Bathing: Moderate assistance Upper Body Dressing: Moderate assistance Lower Body Dressing: Maximal assistance Toileting: Maximal assistance Where Assessed-Toileting: Bedside Commode Toilet Transfer: Moderate assistance Toilet Transfer Method: Squat pivot Toilet Transfer Equipment: Drop arm bedside commode Vision   Perception    Praxis   Exercises:   Other Treatments:     Therapy/Group: Individual Therapy   M  07/12/2021, 6:52 AM 

## 2021-07-12 NOTE — Progress Notes (Signed)
Physical Therapy Session Note  Patient Details  Name: Erika [name] MRN: [id number] Date of Birth: 1971/11/17  Today's Date: 07/12/2021 PT Individual Time: 5621-3086 PT Individual Time Calculation (min): 59 min   Short Term Goals: Week 1:  PT Short Term Goal 1 (Week 1): Pt will perform bed mobility with minA. PT Short Term Goal 1 - Progress (Week 1): Met PT Short Term Goal 2 (Week 1): Pt will perform sit to stand transfer with minA. PT Short Term Goal 2 - Progress (Week 1): Met PT Short Term Goal 3 (Week 1): Pt perform bed to chair transfer with minA. PT Short Term Goal 3 - Progress (Week 1): Not met PT Short Term Goal 4 (Week 1): Pt will ambulate x25' with modA. PT Short Term Goal 4 - Progress (Week 1): Not met Week 2:  PT Short Term Goal 1 (Week 2): Pt will complete bed mobility with CGA PT Short Term Goal 2 (Week 2): Pt will complete bed<>chair transfers with minA PT Short Term Goal 3 (Week 2): Pt will ambulate 21f with modA of 1 person and LRAD  Skilled Therapeutic Interventions/Progress Updates:    Pt received sitting in wc and agreeable to therapy. Gait belt placed prior to initiation of mobility.  Pt completed ~627fx2 of wc mobility supervision navigating obstacles in hall.  Pt cued for improved body mechanics/ROM with RUE/RLE for improved energy efficiency.   STS throughout session with MinA for stability and verbal cuing for body mechanics and weight shifting over to LLE for improved balance/WBing. With increased weight shift for a more equal stance, pt requires a left soft knee block secondary to weakness. Pt demonstrates impulsivity with activities, requiring verbal cuing for safety and to wait for therapist prior to standing.   Activities in // bars completed with MinA and tactile cuing for improved glute and left quad muscle recruitment throughout. Weight shifting L/R x12. Mini-squats 2x5. LLE TKE 2x6 with orange T-band. Rest breaks in between sets secondary to  fatigue with verbal cuing to weight shift for equal stance prior to sitting. LAQ AAROM LLE x10 with focus on eccentric phase.   Pt completed gait training [id number] ModA with RUE support on handrail in hall. Manual facilitation for lateral weight shifting and soft LLE knee block/prevention of hyperextension. Verbal cuing for increased step length RLE, increased weight shifting onto LLE, and tactile cuing to encourage increased quad activation with stance on LLE.   Standing balance reaching cross body to basketball hoop 3 x 4 horse shoes to improve weight shifting onto LLE for improved midline ModA-MaxA. One instance of LOB requiring Max A +2 to maintain balance secondary to fatigue and LLE weakness during weight shifting.   Pt returned to bed with heavy ModA squat pivot transfer and sit>supine ModA for LE management. Bed alarm set and call bell within reach. No further needs expressed at this time.    Therapy Documentation Precautions:  Precautions Precautions: Fall Precaution Comments: L hemiplegia Restrictions Weight Bearing Restrictions: No LUE Weight Bearing: Weight bearing as tolerated LLE Weight Bearing: Weight bearing as tolerated  Pain: Pain Assessment Pain Scale: 0-10 Pain Score: 0-No pain    Therapy/Group: Individual Therapy  Shaquna Geigle, SPT 07/12/2021, 5:12 PM

## 2021-07-12 NOTE — Progress Notes (Signed)
Speech Language Pathology Daily Session Note  Patient Details  Name: ZALIA HAUTALA MRN: [id number] Date of Birth: February 10, 1971  Today's Date: 07/12/2021 SLP Individual Time: 1062-6948 SLP Individual Time Calculation (min): 45 min  Short Term Goals: Week 2: SLP Short Term Goal 1 (Week 2): Patient will consume current diet with minimal overt s/s of aspiration with Min verbal cues for use of swallowing compensatory strategies. SLP Short Term Goal 2 (Week 2): Patient will utilize speech intelligibility strategies at the sentencen level to achieve ~75% intelligibility with Min verbal cues. SLP Short Term Goal 3 (Week 2): Patient will demonstrate selective attention to functional tasks in a mildly distracting enviornment for 30 minutes with Min A verbal cues for redirection. SLP Short Term Goal 4 (Week 2): Patient will demonstrate functional problem solving during mildly complex tasks with supervision verbal cues. SLP Short Term Goal 5 (Week 2): Patient will demonstrate recall of functional information as it relates to care and safety with supervision verbal cues.  Skilled Therapeutic Interventions:   Patient seen with spouse in room for skilled ST session focusing on cognitive function goals. Patient completed portion of medication management/pill box sorting task that she had started in a previous session, with supervision level A for 100% accuracy. When completing a daily scheduling activity, she required frequent redirection cues with SLP repeating task rules several times. She did improve with task and SLP fading cues from modA to [place]. Patient continues to benefit from skilled SLP intervention to maximize cognitive, speech and swallow function goals prior to discharge.  Pain Pain Assessment Pain Scale: 0-10 Pain Score: 0-No pain  Therapy/Group: Individual Therapy  Sonia Baller, MA, CCC-SLP Speech Therapy

## 2021-07-12 NOTE — Plan of Care (Signed)
  Problem: Consults Goal: RH STROKE PATIENT EDUCATION Description: See Patient Education module for education specifics  Outcome: Progressing   Problem: RH BOWEL ELIMINATION Goal: RH STG MANAGE BOWEL WITH ASSISTANCE Description: STG Manage Bowel with mod I Assistance. Outcome: Progressing Goal: RH STG MANAGE BOWEL W/MEDICATION W/ASSISTANCE Description: STG Manage Bowel with Medication with mod i Assistance. Outcome: Progressing   Problem: RH SAFETY Goal: RH STG ADHERE TO SAFETY PRECAUTIONS W/ASSISTANCE/DEVICE Description: STG Adhere to Safety Precautions With cues/reminders Assistance/Device. Outcome: Progressing   Problem: RH COGNITION-NURSING Goal: RH STG USES MEMORY AIDS/STRATEGIES W/ASSIST TO PROBLEM SOLVE Description: STG Uses Memory Aids/Strategies With cues/reminders Assistance to Problem Solve. Outcome: Progressing   Problem: RH PAIN MANAGEMENT Goal: RH STG PAIN MANAGED AT OR BELOW PT'S PAIN GOAL Description: At or below level 4 Outcome: Progressing   Problem: RH KNOWLEDGE DEFICIT Goal: RH STG INCREASE KNOWLEDGE OF DIABETES Description: Patient will be able to manage DM with medications and dietary modifications using handouts and educational tools independently Outcome: Progressing Goal: RH STG INCREASE KNOWLEDGE OF HYPERTENSION Description: Patient will be able to manage HTN with medications and dietary modifications using handouts and educational tools independently Outcome: Progressing Goal: RH STG INCREASE KNOWLEGDE OF HYPERLIPIDEMIA Description: Patient will be able to manage HLD with medications and dietary modifications using handouts and educational tools independently Outcome: Progressing Goal: RH STG INCREASE KNOWLEDGE OF STROKE PROPHYLAXIS Description: Patient will be able to manage secondary stroke risks with medications and dietary modifications using handouts and educational tools independently Outcome: Progressing   Problem: RH KNOWLEDGE  DEFICIT Goal: RH STG INCREASE KNOWLEDGE OF DYSPHAGIA/FLUID INTAKE Description: Patient will be able to manage Dysphagia , medications and dietary modifications using handouts and educational tools independently Outcome: Progressing

## 2021-07-12 NOTE — Progress Notes (Signed)
Occupational Therapy Session Note  Patient Details  Name: Erika [name] MRN: [id number] Date of Birth: 29-Oct-1971  Today's Date: 07/12/2021 OT Individual Time: 0905-1005 OT Individual Time Calculation (min): 60 min    Short Term Goals: Week 1:  OT Short Term Goal 1 (Week 1): Pt will be able to complete toilet transfer squat pivot with min A. OT Short Term Goal 1 - Progress (Week 1): Met OT Short Term Goal 2 (Week 1): Pt will be able to stand with min A and pull pants over hips with min A. OT Short Term Goal 2 - Progress (Week 1): Not met OT Short Term Goal 3 (Week 1): Pt will be able to don shirt with min A. OT Short Term Goal 3 - Progress (Week 1): Met OT Short Term Goal 4 (Week 1): Pt will be able to self mobilize LUE. OT Short Term Goal 4 - Progress (Week 1): Met Week 2:  OT Short Term Goal 1 (Week 2): Pt will be able to stand with min A and pull pants over hips with min A. OT Short Term Goal 2 (Week 2): Pt will complete toilet transfers with stand pivot min assist. OT Short Term Goal 3 (Week 2): Pt will donn a pullover shirt with supervision following hemi techniques.  Skilled Therapeutic Interventions/Progress Updates:    Pt sitting up in recliner, ready to use bathroom and shower/get dressed.  No c/o pain throughout session.  Pts husband present and encouraging throughout session and also eager to learn assist techniques.  Squat pivot to w/c with mod assist.  Transported to bathroom and completed stand pivot to 3 in 1 commode over toilet with mod assist.  Pt had continent episode of bladder.  Husband provided min assist during pt completing sit to stand and also provided max assist to doff pants and brief.    Pt ambulated about five feet from toilet to shower bench using RW with left hand supported by walker splint.  Therapist provided visual demonstration cues prior to completing and educated pt on body mechanics and sequencing.  Sit to stand with min assist then max manual  positioning of LLE through gait pattern forward stepping, pivot towards right, and back stepping, and husband provided CGA for safety.  Min assist stand to sit to slow descent onto shower bench.  Pt required min assist to reposition on shower bench for weight shifting and LLE support. Pt needed consistent instructional verbal cues to facilitate correct BLE positioning prior to standing due to narrow BOS exhibiting impaired procedural memory.    Pt doffed shirt initially attempting without using hemi technique and having difficulty.  Therapist provided question cues to facilitate active problem solving and reasoning however pt unable to remember hemi technique therefore therapist re-educated pt and she doffed shirt with min assisst thereafter. Utilized long handled sponge and bathed UB and LB with min assist overall for thoroughness and to elevate LUE slightly to wash under arm.  Donned shirt needing step by step verbal question cues in order to problem solve through hemi technique.    Stand pivot shower bench to w/c with mod assist.  Pt donned pants, underwear, and socks with max assist due to therapy time constraints.  Stand pivot w/c to EOB with mod assist.  Sit to supine with min assist.  Call bell in reach, bed alarm on at end of session.    Pt showing difficulties with carryover of hemi techniques during dressing tasks due to impaired attention and memory.  Pt may benefit from visual reminders including handouts with description for increased carryover of compensatory strategies.  Therapy Documentation Precautions:  Precautions Precautions: Fall Precaution Comments: L hemiplegia Restrictions Weight Bearing Restrictions: No LUE Weight Bearing: Weight bearing as tolerated LLE Weight Bearing: Weight bearing as tolerated   Therapy/Group: Individual Therapy  Ezekiel Slocumb 07/12/2021, 3:04 PM

## 2021-07-12 NOTE — Progress Notes (Signed)
PROGRESS NOTE   Subjective/Complaints:  No issues overnite discussed CBG and BP   ROS: Patient denies CP, SOB, N/V/D  Objective:   No results found. Recent Labs    07/09/21 1621 07/10/21 0325  WBC 16.2* 14.8*  HGB 13.5 12.9  HCT 41.3 39.0  PLT 383 335    Recent Labs    07/10/21 0325  NA 137  K 3.7  CL 98  CO2 31  GLUCOSE 83  BUN 12  CREATININE 0.83  CALCIUM 9.9     Intake/Output Summary (Last 24 hours) at 07/12/2021 0835 Last data filed at 07/11/2021 1700 Gross per 24 hour  Intake 236 ml  Output --  Net 236 ml          Physical Exam: Vital Signs Blood pressure 127/60, pulse 66, temperature 98.1 F (36.7 C), temperature source Oral, resp. rate 18, height 5\' 6"  (1.676 m), weight 57.6 kg, SpO2 93 %.  General: No acute distress Mood and affect are appropriate Heart: Regular rate and rhythm no rubs murmurs or extra sounds Lungs: Clear to auscultation, breathing unlabored, no rales or wheezes Abdomen: Positive bowel sounds, soft nontender to palpation, nondistended Extremities: No clubbing, cyanosis, or edema Skin: No evidence of breakdown, no evidence of rash   Neurological:    Mental Status: She is alert and oriented to person, place, and time.    Comments: Left facial weakness with mild dysarthria. Able to answer orientation questions and follow commands without difficulty.  0/5 LUE and trace-1/5 prox to 0/5 distally LLE 4/5 on Right side    Assessment/Plan: 1. Functional deficits which require 3+ hours per day of interdisciplinary therapy in a comprehensive inpatient rehab setting. Physiatrist is providing close team supervision and 24 hour management of active medical problems listed below. Physiatrist and rehab team continue to assess barriers to discharge/monitor patient progress toward functional and medical goals  Care Tool:  Bathing    Body parts bathed by patient: Chest, Abdomen,  Front perineal area, Right upper leg, Left upper leg, Face, Left arm, Right arm, Right lower leg, Left lower leg (with long sponge)   Body parts bathed by helper: Right arm, Buttocks, Right lower leg, Left lower leg     Bathing assist Assist Level: Moderate Assistance - Patient 50 - 74%     Upper Body Dressing/Undressing Upper body dressing   What is the patient wearing?: Pull over shirt    Upper body assist Assist Level: Moderate Assistance - Patient 50 - 74%    Lower Body Dressing/Undressing Lower body dressing      What is the patient wearing?: Pants     Lower body assist Assist for lower body dressing: Maximal Assistance - Patient 25 - 49%     Toileting Toileting    Toileting assist Assist for toileting: Minimal Assistance - Patient > 75%     Transfers Chair/bed transfer  Transfers assist     Chair/bed transfer assist level: Moderate Assistance - Patient 50 - 74%     Locomotion Ambulation   Ambulation assist      Assist level: 2 helpers Assistive device: Hand held assist Max distance: 35   Walk 10 feet activity  Assist     Assist level: 2 helpers Assistive device: Hand held assist   Walk 50 feet activity   Assist Walk 50 feet with 2 turns activity did not occur: Safety/medical concerns  Assist level: Dependent - Patient 0% Assistive device: Lite Gait    Walk 150 feet activity   Assist Walk 150 feet activity did not occur: Safety/medical concerns         Walk 10 feet on uneven surface  activity   Assist Walk 10 feet on uneven surfaces activity did not occur: Safety/medical concerns         Wheelchair     Assist Will patient use wheelchair at discharge?: No Type of Wheelchair: Manual    Wheelchair assist level: Supervision/Verbal cueing Max wheelchair distance: 169ft    Wheelchair 50 feet with 2 turns activity    Assist        Assist Level: Supervision/Verbal cueing   Wheelchair 150 feet activity      Assist          Blood pressure 127/60, pulse 66, temperature 98.1 F (36.7 C), temperature source Oral, resp. rate 18, height 5\' 6"  (1.676 m), weight 57.6 kg, SpO2 93 %.    Medical Problem List and Plan: 1.  Right pontine stroke             -patient may shower             -ELOS: 8/2             -husband may come in before 8am  -Continue CIR therapies including PT, OT, and SLP   -grounds pass prn 2.  Impaired mobility: Continue Lovenox             -antiplatelet therapy: Continue ASA/Plavix 3. Cluster headaches and migraines: continue abortive oxygen PRN. Oxycodone prn for HA. Discussed outpatient Botox- patient would like to try.   7/16- will try Periactin prn daily and phenergan for nausea associated.   7/17- hasn't tried yet, but is a little better- con't regimen 4. Mood: Team to provide ego support. LCSW to follow for evaluation and support.             -antipsychotic agents: Lamictal daily.  -asked pt to let us know if she needs help with her mood/coping.  5. Neuropsych: This patient is capable of making decisions on her own behalf. 6. Skin/Wound Care: Routine pressure relief measures. 7. Fluids/Electrolytes/Nutrition: Monitor I/O. Check lytes in am. 8. HTN: SBP goal 140-180 range to avoid hypoperfusion of brain. Per husband "acts drunk when BP is low" --Continue Imdur, Coreg, aldactone, Demadex, Per reports 7/14: BP below goal given stenosis: decreased coreg to 3.125mg  BID.  Vitals:   07/11/21 2031 07/12/21 0424  BP: (!) 154/73 127/60  Pulse: 63 66  Resp: 18 18  Temp: 98.2 F (36.8 C) 98.1 F (36.7 C)  SpO2: 97% 93%  Controlled this am but goal in 140-180 range , reduce cozaar to 12.5mg  Diuretic stopped but she is +7L since admit--monitor volume closely- no clinic signs of peripheral edema  9. T2DM: Hgb A1C- 10.7. BS run 200's at home. Dietary education/reinforcement.             --Continue Lantus  45 units BID with SSI for elevated BS             --7/10:  hypoglycemic to 58, decrease Lantus to 35U BID CBG (last 3)  Recent Labs    07/11/21 1649 07/11/21 2035 07/12/21 0622  GLUCAP 110*  259* 54*   Hypoglycemia in am reduce lantus to 27U    10. CAD s/p CABG: On ASA, coreg, Cozaar, Demadex, ASA, 11. PAD w/claudication: Has been smoking 1 PPD --quit at admission. Continue Nicotine patch. --On lyrica BID to manage symptoms.   12. Aspiration PNA: CT reviewed, appreciate medicine consult,  -wbcs down to 14.8k 7/18 -unasyn on board since 7/11 13. Chronic cluster headaches, currently well controlled: Managed with thorazine as well as oxycodone and high flow oxygen prn. Decreased oxycodone to TID PRN and edited order to indicate it should be given after therapy as per husband's request- makes her too sedated to tolerate therapy. 7/16- added Periactin prn for associated Sx's-con't oxy and O2.   7/18 may have taken periactin too late 7/16---felt hung over all day Sunday---feeling better today---would still try limit use. Continue periactin for now 14. H/o depression/anxiety: Continue Lamictal, Prozac with Klonopin prn. 15. Chronic insomnia: Managed with home dose trazodone. 16. Dyslipidemia: Trig- 636, direct LDL-99.6, HDL 29, Chol 202. On Zetia--to start Vespc 17. Vitamin B 12 deficiency: B12- 169 (was WNL a year ago) 18. NASH/Fatty liver: LFTs reviewed and normalized 19. Dense left sided hemiplegia: [place] regarding NMES- started trials Friday. They have TENS unit at home.  20. Hypokalemia: supplement 65meq 7/7 and repeat BMP reviewed, K+ improved to 3.8. Resolved. D/c daily supplement.  21. Leukocytosis: trending upward potentially secondary to aspiration pneumonia  7/18 still some elevation today   -afebrile, no s/s on exam   -monitor serially   -hospitalist following 22. Vitamin D deficiency: start ergocalciferol 50,000U weekly for 7 weeks.  23.  Hypoxia ,resolved  24. AKI: repeat BMP   25. Poor perfusion of LLE  7/17- per IM, LLE  is colder than RLE_ by a lot- and no DP pulse palpable- Vascular said con't current regimen and monitor closely for low perfusion injury. 7/18 leg feels warmer (by comparative accounts). Normal appearing   -recent ABI 0.46   26. Left post-stroke shoulder pain: d/c lidocaine patch and ordered voltaren gel. No c/o 7/20 27. Disposition: Lives with husband and daughter (autistic but high functioning)- husband very involved and has already coordinated with his boss to take time off to care for her. Messaged April to schedule HFU/TC.    LOS: 14 days A FACE TO FACE EVALUATION WAS PERFORMED  Erika [name] 07/12/2021, 8:35 AM

## 2021-07-12 NOTE — Progress Notes (Addendum)
Pt had a witnessed fall during a transfer to [facility] around 1645. Denied hitting at the time, but reported hitting head and shoulder to her spouse later. Spouse reported that to Probation officer. Michaelyn Barter, [place]. New order obtained.   [name], RN

## 2021-07-12 NOTE — Progress Notes (Signed)
   07/12/21 1650  What Happened  Was fall witnessed? Yes  Who witnessed fall? Erika [name], NT  Patients activity before fall other (comment) (to bsc assisted)  Point of contact other (comment) (side)  Was patient injured? No  Follow Up  MD notified pam love, PA  Time MD notified 22  Family notified Yes - comment  Time family notified 1650  Progress note created (see row info) Yes  Adult Fall Risk Assessment  Risk Factor Category (scoring not indicated) High fall risk per protocol (document High fall risk)  Age 50  Fall History: Fall within 6 months prior to admission 5  Elimination; Bowel and/or Urine Incontinence 0  Elimination; Bowel and/or Urine Urgency/Frequency 2  Medications: includes PCA/Opiates, Anti-convulsants, Anti-hypertensives, Diuretics, Hypnotics, Laxatives, Sedatives, and Psychotropics 5  Patient Care Equipment 1  Mobility-Assistance 2  Mobility-Gait 2  Mobility-Sensory Deficit 0  Altered awareness of immediate physical environment 0  Impulsiveness 0  Lack of understanding of one's physical/cognitive limitations 0  Total Score 17  Patient Fall Risk Level High fall risk  Adult Fall Risk Interventions  Required Bundle Interventions *See Row Information* High fall risk - low, moderate, and high requirements implemented  Additional Interventions Family Supervision;Use of appropriate toileting equipment (bedpan, BSC, etc.)  Pain Assessment  Pain Scale 0-10  Pain Score 0  Neurological  Neuro (WDL) X  Level of Consciousness Alert  Orientation Level Oriented X4  Cognition Appropriate at baseline  Speech Slurred/Dysarthria  Pupil Assessment  No  L Hand Grip Absent   L Foot Dorsiflexion Absent  L Foot Plantar Flexion Absent  LUE Sensation Decreased  LLE Sensation Decreased  Seizure Activity  Psychomotor Symptoms None  Glasgow Coma Scale  Eye Opening 4  Best Verbal Response (NON-intubated) 5  Best Motor Response 6  Glasgow Coma Scale Score 15  Musculoskeletal   Musculoskeletal (WDL) X  Assistive Device BSC;Stedy;Wheelchair  Generalized Weakness Yes  Weight Bearing Restrictions No  LUE Weight Bearing WBAT  LLE Weight Bearing WBAT  Integumentary  Integumentary (WDL) X  RN Assisting with Skin Assessment on Admission Mekides, RN  Skin Color Appropriate for ethnicity  Skin Condition Dry  Skin Integrity Abrasion  Abrasion Location Knee  Abrasion Location Orientation Left  Abrasion Intervention Cleansed  Skin Turgor Non-tenting  Patient s/p fall, patient denies any distress and initially denied hitting head.   Later patient did report to her spouse that she hit her head.  Erika Hoff, PA made aware; new order for CT of head received.

## 2021-07-13 LAB — GLUCOSE, CAPILLARY
Glucose-Capillary: 77 mg/dL (ref 70–99)
Glucose-Capillary: 175 mg/dL — ABNORMAL HIGH (ref 70–99)
Glucose-Capillary: 138 mg/dL — ABNORMAL HIGH (ref 70–99)
Glucose-Capillary: 141 mg/dL — ABNORMAL HIGH (ref 70–99)

## 2021-07-13 MED ORDER — CARVEDILOL 3.125 MG PO TABS
3.1250 mg | ORAL_TABLET | Freq: Every day | ORAL | Status: DC
  Administered 2021-07-14 – 2021-07-26 (×13): 3.125 mg via ORAL
  Filled 2021-07-13 (×13): qty 1

## 2021-07-13 NOTE — Progress Notes (Signed)
Occupational Therapy Session Note  Patient Details  Name: Erika [name] MRN: [id number] Date of Birth: 09/04/1971  Today's Date: 07/13/2021 OT Individual Time: 5885-0277 OT Individual Time Calculation (min): 70 min    Short Term Goals: Week 1:  OT Short Term Goal 1 (Week 1): Pt will be able to complete toilet transfer squat pivot with min A. OT Short Term Goal 1 - Progress (Week 1): Met OT Short Term Goal 2 (Week 1): Pt will be able to stand with min A and pull pants over hips with min A. OT Short Term Goal 2 - Progress (Week 1): Not met OT Short Term Goal 3 (Week 1): Pt will be able to don shirt with min A. OT Short Term Goal 3 - Progress (Week 1): Met OT Short Term Goal 4 (Week 1): Pt will be able to self mobilize LUE. OT Short Term Goal 4 - Progress (Week 1): Met Week 2:  OT Short Term Goal 1 (Week 2): Pt will be able to stand with min A and pull pants over hips with min A. OT Short Term Goal 2 (Week 2): Pt will complete toilet transfers with stand pivot min assist. OT Short Term Goal 3 (Week 2): Pt will donn a pullover shirt with supervision following hemi techniques.  Skilled Therapeutic Interventions/Progress Updates:    Pt sitting up in w/c, reports no pain, requesting to defer bathing and dressing, but would like to go to the gym to work on strength and balance.  Pt self propelled approximately 75 feet using RUE/RLE with min assist to maneuver around obstacles in tight spaces.  Pt transported remainder of distance for time mgt purposes.  Pt completed UBE with left hand supported by ace wrap into composite grasp around handle.  Pt instructed in AAROM and provided with facilitatory tactile cues to propel forward x 5 minutes then backwards x 5 minutes while also providing tactile cues and manual to promote healthy scapulohumeral rhythm due to slightly limited upward/downward rotation noted.  Pt then participated in blocked practice sit<>stands at [facility] and also dynamic standing task  with functional reach of RUE and cognitive memory challenge components.  Pt required consistent question cues to facilitate pt self awareness of safe body mechanics prior to standing and sitting.  Pt also required CGA and blocking of left knee for transfer.  Min assist needed when leaning outside BOS and crossing RUE over midline due to unsteadiness.  Pt able to recall up to 4 word-picture sequence using BITs. When adding 5 words, pt exhibited significantly increased errors with difficulty memorizing sequence.  Pt returned to room and setup of unattended NMES provided:   1:1 NMES applied to supraspinatus and middle deltoid to help approximate shoulder joint to increase strength.   Ratio 1:3 Rate 35 pps Waveform- Asymmetric Ramp 1.0 Pulse 300 Intensity- 4 Duration -   20  Report of pain at the beginning of session  Report of pain at the end of session  No adverse reactions after treatment and is skin intact.    Pt instructed to complete HEP while NMES applied of table slides AAROM shoulder flexion/extension at tabletop.  Call bell in reach, seat alarm on at end of session.   Therapy Documentation Precautions:  Precautions Precautions: Fall Precaution Comments: L hemiplegia Restrictions Weight Bearing Restrictions: No LUE Weight Bearing: Weight bearing as tolerated LLE Weight Bearing: Weight bearing as tolerated    Therapy/Group: Individual Therapy  Ezekiel Slocumb 07/13/2021, 3:45 PM

## 2021-07-13 NOTE — Progress Notes (Signed)
PROGRESS NOTE   Subjective/Complaints: Had fall yesterday- husband upset that [facility] is not always being used- please ensure all transfers to bathroom use Stedy! Has small abraison to left knee. Reviewed CT with them- normal.   ROS: Patient denies CP, SOB, N/V/D  Objective:   CT HEAD WO CONTRAST  Result Date: 07/12/2021 CLINICAL DATA:  Head trauma severe headache post fall EXAM: CT HEAD WITHOUT CONTRAST TECHNIQUE: Contiguous axial images were obtained from the base of the skull through the vertex without intravenous contrast. COMPARISON:  CT head 07/03/2021 FINDINGS: Brain: Right parietal and occipital infarction. No evidence of large-territorial acute infarction. No parenchymal hemorrhage. No mass lesion. No extra-axial collection. No mass effect or midline shift. No hydrocephalus. Basilar cisterns are patent. Vascular: No hyperdense vessel. Atherosclerotic calcifications are present within the cavernous internal carotid arteries. Skull: No acute fracture or focal lesion. Sinuses/Orbits: Paranasal sinuses and mastoid air cells are clear. The orbits are unremarkable. Other: None. IMPRESSION: No acute intracranial abnormality. Electronically Signed   By: Iven Finn M.D.   On: 07/12/2021 22:21   No results for input(s): WBC, HGB, HCT, PLT in the last 72 hours. No results for input(s): NA, K, CL, CO2, GLUCOSE, BUN, CREATININE, CALCIUM in the last 72 hours.  Intake/Output Summary (Last 24 hours) at 07/13/2021 1036 Last data filed at 07/12/2021 1740 Gross per 24 hour  Intake 410 ml  Output --  Net 410 ml         Physical Exam: Vital Signs Blood pressure 137/65, pulse 77, temperature 98 F (36.7 C), resp. rate 16, height 5\' 6"  (1.676 m), weight 57.6 kg, SpO2 92 %.  Gen: no distress, normal appearing HEENT: oral mucosa pink and moist, NCAT Cardio: Reg rate Chest: normal effort, normal rate of breathing Abd: soft,  non-distended Ext: no edema Psych: pleasant, normal affect Skin: intact   Neurological:    Mental Status: She is alert and oriented to person, place, and time.    Comments: Left facial weakness with mild dysarthria. Able to answer orientation questions and follow commands without difficulty.  0/5 LUE and trace-1/5 prox to 0/5 distally LLE 4/5 on Right side    Assessment/Plan: 1. Functional deficits which require 3+ hours per day of interdisciplinary therapy in a comprehensive inpatient rehab setting. Physiatrist is providing close team supervision and 24 hour management of active medical problems listed below. Physiatrist and rehab team continue to assess barriers to discharge/monitor patient progress toward functional and medical goals  Care Tool:  Bathing    Body parts bathed by patient: Abdomen, Chest, Left arm, Right arm, Front perineal area, Right upper leg, Left upper leg, Right lower leg, Left lower leg, Face   Body parts bathed by helper: Right arm, Buttocks, Right lower leg, Left lower leg     Bathing assist Assist Level: Minimal Assistance - Patient > 75%     Upper Body Dressing/Undressing Upper body dressing   What is the patient wearing?: Pull over shirt    Upper body assist Assist Level: Minimal Assistance - Patient > 75%    Lower Body Dressing/Undressing Lower body dressing      What is the patient wearing?: Pants, Underwear/pull up  Lower body assist Assist for lower body dressing: Maximal Assistance - Patient 25 - 49%     Toileting Toileting    Toileting assist Assist for toileting: Moderate Assistance - Patient 50 - 74%     Transfers Chair/bed transfer  Transfers assist     Chair/bed transfer assist level: Moderate Assistance - Patient 50 - 74%     Locomotion Ambulation   Ambulation assist      Assist level: 2 helpers Assistive device: Hand held assist Max distance: 35   Walk 10 feet activity   Assist     Assist level:  2 helpers Assistive device: Hand held assist   Walk 50 feet activity   Assist Walk 50 feet with 2 turns activity did not occur: Safety/medical concerns  Assist level: Dependent - Patient 0% Assistive device: Lite Gait    Walk 150 feet activity   Assist Walk 150 feet activity did not occur: Safety/medical concerns         Walk 10 feet on uneven surface  activity   Assist Walk 10 feet on uneven surfaces activity did not occur: Safety/medical concerns         Wheelchair     Assist Will patient use wheelchair at discharge?: No Type of Wheelchair: Manual    Wheelchair assist level: Supervision/Verbal cueing Max wheelchair distance: 180ft    Wheelchair 50 feet with 2 turns activity    Assist        Assist Level: Supervision/Verbal cueing   Wheelchair 150 feet activity     Assist          Blood pressure 137/65, pulse 77, temperature 98 F (36.7 C), resp. rate 16, height 5\' 6"  (1.676 m), weight 57.6 kg, SpO2 92 %.    Medical Problem List and Plan: 1.  Right pontine stroke             -patient may shower             -ELOS: 8/2             -husband may come in before 8am, updated him on 7/21  -Continue CIR therapies including PT, OT, and SLP   -grounds pass prn 2.  Impaired mobility: Continue Lovenox             -antiplatelet therapy: Continue ASA/Plavix 3. Cluster headaches and migraines: continue abortive oxygen PRN. Oxycodone prn for HA. Discussed outpatient Botox- patient would like to try.   7/16- will try Periactin prn daily and phenergan for nausea associated.   7/17- hasn't tried yet, but is a little better- con't regimen 4. Mood: Team to provide ego support. LCSW to follow for evaluation and support.             -antipsychotic agents: Lamictal daily.  -asked pt to let us know if she needs help with her mood/coping.  5. Neuropsych: This patient is capable of making decisions on her own behalf. 6. Skin/Wound Care: Routine pressure  relief measures. 7. Fluids/Electrolytes/Nutrition: Monitor I/O. Check lytes in am. 8. HTN: SBP goal 140-180 range to avoid hypoperfusion of brain. Per husband "acts drunk when BP is low" --Continue Imdur, Coreg, aldactone, Demadex,  Vitals:   07/12/21 1940 07/13/21 0450  BP: (!) 156/59 137/65  Pulse: 63 77  Resp: 18 16  Temp:  98 F (36.7 C)  SpO2: 92% 92%  Controlled this am but goal in 140-180 range. Decrease coreg to 3.125mg  daily Diuretic stopped but she is +7L since admit--monitor volume closely-  no clinic signs of peripheral edema  9. T2DM: Hgb A1C- 10.7. BS run 200's at home. Dietary education/reinforcement.             --Continue Lantus  45 units BID with SSI for elevated BS             --7/10: hypoglycemic to 58, decrease Lantus to 35U BID CBG (last 3)  Recent Labs    07/12/21 1632 07/12/21 2015 07/13/21 0622  GLUCAP 152* 162* 77  Hypoglycemia in am reduce lantus to 27U    10. CAD s/p CABG: On ASA, coreg, Cozaar, Demadex, ASA, 11. PAD w/claudication: Has been smoking 1 PPD --quit at admission. Continue Nicotine patch. --On lyrica BID to manage symptoms.   12. Aspiration PNA: CT reviewed, appreciate medicine consult,  -wbcs down to 14.8k 7/18 -unasyn on board since 7/11 13. Chronic cluster headaches, currently well controlled: Managed with thorazine as well as oxycodone and high flow oxygen prn. Decreased oxycodone to TID PRN and edited order to indicate it should be given after therapy as per husband's request- makes her too sedated to tolerate therapy. 7/16- added Periactin prn for associated Sx's-con't oxy and O2.   7/18 may have taken periactin too late 7/16---felt hung over all day Sunday---feeling better today---would still try limit use. Continue periactin for now 14. H/o depression/anxiety: Continue Lamictal, Prozac with Klonopin prn. 15. Chronic insomnia: Managed with home dose trazodone. 16. Dyslipidemia: Trig- 636, direct LDL-99.6, HDL 29, Chol 202. On  Zetia--to start Vespc 17. Vitamin B 12 deficiency: B12- 169 (was WNL a year ago) 18. NASH/Fatty liver: LFTs reviewed and normalized 19. Dense left sided hemiplegia: [place] regarding NMES- started trials Friday. They have TENS unit at home.  20. Hypokalemia: supplement 36meq 7/7 and repeat BMP reviewed, K+ improved to 3.8. Resolved. D/c daily supplement.  21. Leukocytosis: trending upward potentially secondary to aspiration pneumonia  7/18 still some elevation today   -afebrile, no s/s on exam   -monitor serially   -hospitalist following 22. Vitamin D deficiency: continue ergocalciferol 50,000U weekly for 7 weeks.  23.  Hypoxia ,resolved  24. AKI: repeat BMP   25. Poor perfusion of LLE  7/17- per IM, LLE is colder than RLE_ by a lot- and no DP pulse palpable- Vascular said con't current regimen and monitor closely for low perfusion injury. 7/18 leg feels warmer (by comparative accounts). Normal appearing   -recent ABI 0.46   26. Left post-stroke shoulder pain: d/c lidocaine patch and ordered voltaren gel. No c/o 7/20 27. S/p fall- lac to left knee, Head CT negative- reviewed with patient and husband. Please use Stedy for all transfers.  28. Disposition: Lives with husband and daughter (autistic but high functioning)- husband very involved and has already coordinated with his boss to take time off to care for her. Messaged April to schedule HFU/TC.    LOS: 15 days A FACE TO FACE EVALUATION WAS PERFORMED  Clide Deutscher Jameya Pontiff 07/13/2021, 10:36 AM

## 2021-07-13 NOTE — Progress Notes (Signed)
Speech Language Pathology Weekly Progress and Session Note  Patient Details  Name: Erika [name] MRN: [id number] Date of Birth: 1971/01/10  Beginning of progress report period: 07/06/2021 End of progress report period: 07/13/2021  Today's Date: 07/13/2021 SLP Individual Time: 1300-1355 SLP Individual Time Calculation (min): 55 min  Short Term Goals: Week 2: SLP Short Term Goal 1 (Week 2): Patient will consume current diet with minimal overt s/s of aspiration with Min verbal cues for use of swallowing compensatory strategies. SLP Short Term Goal 1 - Progress (Week 2): Met SLP Short Term Goal 2 (Week 2): Patient will utilize speech intelligibility strategies at the sentencen level to achieve ~75% intelligibility with Min verbal cues. SLP Short Term Goal 2 - Progress (Week 2): Met SLP Short Term Goal 3 (Week 2): Patient will demonstrate selective attention to functional tasks in a mildly distracting enviornment for 30 minutes with Min A verbal cues for redirection. SLP Short Term Goal 3 - Progress (Week 2): Met SLP Short Term Goal 4 (Week 2): Patient will demonstrate functional problem solving during mildly complex tasks with supervision verbal cues. SLP Short Term Goal 4 - Progress (Week 2): Progressing toward goal SLP Short Term Goal 5 (Week 2): Patient will demonstrate recall of functional information as it relates to care and safety with supervision verbal cues. SLP Short Term Goal 5 - Progress (Week 2): Met    New Short Term Goals: Week 3: SLP Short Term Goal 1 (Week 3): Patient will tolerate current diet of Dys 3 solids, thin liquids with supervision A level for following swallow safety precautions. SLP Short Term Goal 2 (Week 3): Patient will tolerate trials of regular texture solids with supervision to minA for clearing oral residuals. SLP Short Term Goal 3 (Week 3): Patient will perform mildly complex tasks in minimally distracting environment at supervision A level. SLP Short  Term Goal 4 (Week 3): Patient will demonstrate awareness to errors and self correct errors with minA cues during functional tasks. SLP Short Term Goal 5 (Week 3): Patient will demonstrate anticipatory awareness abilities during simulated functional tasks with supervision A. SLP Short Term Goal 6 (Week 3): Patient will be able to direct her care during transfers with minA cues to initiate.  Weekly Progress Updates: Patient made good progress, meeting 5/6 STG"s and making good progress on goal she did not meet. She has been upgraded to Dys 3 solids and continues on full supervision due to impulsivity. She is demonstrating improved attention, awareness and problem solving skills but continues to require cues to slow down, use strategies for alternating attention tasks, etc.      Intensity: Minumum of 1-2 x/day, 30 to 90 minutes Frequency: 3 to 5 out of 7 days Duration/Length of Stay: 07/25/21 Treatment/Interventions: Cognitive remediation/compensation;Dysphagia/aspiration precaution training;Medication managment;Patient/family education;Functional tasks;Speech/Language facilitation;Internal/external aids;Cueing hierarchy;Environmental controls;Therapeutic Activities   Daily Session  Skilled Therapeutic Interventions: Patient seen with spouse present for portion of session, for skilled ST session focusing on cognitive function. Patient two different, mildly complex alternating attention problem solving tasks. After SLP provided verbal instruction and demonstration, patient required min-modA cues for first task and minA cues for second task. She did demonstrate several instances of noticing her error which mainly involved working too quickly. Patient and SLP discussed her assisted fall previous date secondary to incorrect transfer method. Patient stated she blamed herself. SLP discussed importance of patient advocating for herself and directing her care, but also that staff are to be aware of safest  transfer methods. Patient continues  to benefit from skilled SLP intervention to maximize cognitive-linguistic, speech and swallow function prior to discharge.     General    Pain Pain Assessment Pain Scale: 0-10 Pain Score: 0-No pain  Therapy/Group: Individual Therapy  Sonia Baller, MA, CCC-SLP Speech Therapy

## 2021-07-13 NOTE — Progress Notes (Signed)
Physical Therapy Weekly Progress Note  Patient Details  Name: Erika [name] MRN: [id number] Date of Birth: 1971-12-05  Beginning of progress report period: July 06, 2021 End of progress report period: July 13, 2021  Today's Date: 07/13/2021 PT Individual Time: 5035-4656 PT Individual Time Calculation (min): 60 min   Patient has met 0 of 3 short term goals.  Although she has not met any of her 3 goals, she continues to make progress towards her LTG during her rehab stay. She requires min to modA for bed mobility (depending on fatigue) modA for squat<>pivot transfers, minA for sit<>stand transfers, and has ambulated ~27ft with modA using R handrail in hallway. She is able to propel herself in her w/c with supervision via hemi-technique.   Patient continues to demonstrate the following deficits muscle weakness, decreased cardiorespiratoy endurance, impaired timing and sequencing, unbalanced muscle activation, motor apraxia, decreased coordination, and decreased motor planning, decreased midline orientation, decreased attention to left, and decreased motor planning, decreased attention, decreased awareness, decreased problem solving, decreased safety awareness, and decreased memory, and decreased sitting balance, decreased standing balance, decreased postural control, hemiplegia, and decreased balance strategies and therefore will continue to benefit from skilled PT intervention to increase functional independence with mobility.  Patient progressing toward long term goals..  Continue plan of care.  PT Short Term Goals Week 2:  PT Short Term Goal 1 (Week 2): Pt will complete bed mobility with CGA PT Short Term Goal 1 - Progress (Week 2): Not met PT Short Term Goal 2 (Week 2): Pt will complete bed<>chair transfers with minA PT Short Term Goal 2 - Progress (Week 2): Not met PT Short Term Goal 3 (Week 2): Pt will ambulate 29ft with modA of 1 person and LRAD PT Short Term Goal 3 - Progress (Week  2): Not met Week 3:  PT Short Term Goal 1 (Week 3): Pt will complete bed mobility with minA PT Short Term Goal 2 (Week 3): Pt will complete bed<>chair transfers with minA and LRAD PT Short Term Goal 3 (Week 3): Pt will ambulate 3ft with modA of 1 person and LRAD PT Short Term Goal 4 (Week 3): Husband will participate in hands on training PT Short Term Goal 5 (Week 3): Pt will initiate stair training   Skilled Therapeutic Interventions/Progress Updates:     Pt greeted seated in w/c at start of session, husband at bedside. Pt agreeable to therapy without reports of pain. Reviewed fall precautions due to fall yesterday with nursing staff. Pt was wheeled to ortho rehab gym in w/c with [place] for time management. Completed squat<>pivot transfer with modA from w/c to mat table with cues for forward weight shift and hip translation. With RW, performed repeated sit<>stands with minA and mild L knee block. Next, wrapped her foot with ace wrap for DF assist and provided shoe cover to toe cap of L foot. Began to work on Conservation officer, historic buildings with RW and minA however after taking a few steps, pt's underwear/shorts began to slide down and she requested to return to her room to fix this. Squat<>pivot with modA back to her w/c and returned to her room where she required assist for lying down in bed with modA and rolled in bed to fix her underwear/shorts. Then returned to sitting position with HOB flat, requiring modA for trunk elevation. Once sitting, reapplied shoes with totalA and completed squat<>pivot transfer with modA back to her w/c. Wheeled to day room rehab gym to focus remainder of session on  functional gait training. Ace wrapped LLE again and applied shoe cover. Gait training with minA +2 assist for w/c follow on level ground with RW - ambulated ~30ft + ~85ft (seated rest break). Assist needed for LLE management and knee blocking during stance - pt with improved ability to initiate hip flexion for swing  phase but lacks quad control in stance with L knee hyperextended. At this point of the session, pt reporting moderate fatigue and requesting to return to her room to lay down. Returned to room in w/c and assisted back to bed via squat<>pivot with modA (similar techniques as above). Returned to bed with modA for trunk and LLE management. Pillow provided for LUE. All needs met with bed alarm on.    Therapy Documentation Precautions:  Precautions Precautions: Fall Precaution Comments: L hemiplegia Restrictions Weight Bearing Restrictions: No LUE Weight Bearing: Weight bearing as tolerated LLE Weight Bearing: Weight bearing as tolerated General: PT Amount of Missed Time (min): 15 Minutes PT Missed Treatment Reason: Patient fatigue  Therapy/Group: Individual Therapy  Lourie Retz P Barbarann Kelly 07/13/2021, 6:30 AM

## 2021-07-14 LAB — GLUCOSE, CAPILLARY
Glucose-Capillary: 76 mg/dL (ref 70–99)
Glucose-Capillary: 150 mg/dL — ABNORMAL HIGH (ref 70–99)
Glucose-Capillary: 110 mg/dL — ABNORMAL HIGH (ref 70–99)
Glucose-Capillary: 150 mg/dL — ABNORMAL HIGH (ref 70–99)

## 2021-07-14 NOTE — Progress Notes (Signed)
PROGRESS NOTE   Subjective/Complaints:   No issues overnite no pain c/o, taking meds with LPN  ROS: Patient denies CP, SOB, N/V/D  Objective:   CT HEAD WO CONTRAST  Result Date: 07/12/2021 CLINICAL DATA:  Head trauma severe headache post fall EXAM: CT HEAD WITHOUT CONTRAST TECHNIQUE: Contiguous axial images were obtained from the base of the skull through the vertex without intravenous contrast. COMPARISON:  CT head 07/03/2021 FINDINGS: Brain: Right parietal and occipital infarction. No evidence of large-territorial acute infarction. No parenchymal hemorrhage. No mass lesion. No extra-axial collection. No mass effect or midline shift. No hydrocephalus. Basilar cisterns are patent. Vascular: No hyperdense vessel. Atherosclerotic calcifications are present within the cavernous internal carotid arteries. Skull: No acute fracture or focal lesion. Sinuses/Orbits: Paranasal sinuses and mastoid air cells are clear. The orbits are unremarkable. Other: None. IMPRESSION: No acute intracranial abnormality. Electronically Signed   By: Iven Finn M.D.   On: 07/12/2021 22:21   No results for input(s): WBC, HGB, HCT, PLT in the last 72 hours. No results for input(s): NA, K, CL, CO2, GLUCOSE, BUN, CREATININE, CALCIUM in the last 72 hours.  Intake/Output Summary (Last 24 hours) at 07/14/2021 0825 Last data filed at 07/14/2021 0700 Gross per 24 hour  Intake 246 ml  Output --  Net 246 ml          Physical Exam: Vital Signs Blood pressure 140/62, pulse (!) 55, temperature 97.6 F (36.4 C), resp. rate 20, height '5\' 6"'$  (1.676 m), weight 57.6 kg, SpO2 94 %.    General: No acute distress Mood and affect are appropriate Heart: Regular rate and rhythm no rubs murmurs or extra sounds Lungs: Clear to auscultation, breathing unlabored, no rales or wheezes Abdomen: Positive bowel sounds, soft nontender to palpation, nondistended Extremities: No  clubbing, cyanosis, or edema Skin: No evidence of breakdown, no evidence of rash Neurological:    Mental Status: She is alert and oriented to person, place, and time.    Comments: Left facial weakness with mild dysarthria. Able to answer orientation questions and follow commands without difficulty.  0/5 LUE and trace-1/5 prox to 0/5 distally LLE 4/5 on Right side    Assessment/Plan: 1. Functional deficits which require 3+ hours per day of interdisciplinary therapy in a comprehensive inpatient rehab setting. Physiatrist is providing close team supervision and 24 hour management of active medical problems listed below. Physiatrist and rehab team continue to assess barriers to discharge/monitor patient progress toward functional and medical goals  Care Tool:  Bathing    Body parts bathed by patient: Abdomen, Chest, Left arm, Right arm, Front perineal area, Right upper leg, Left upper leg, Right lower leg, Left lower leg, Face   Body parts bathed by helper: Right arm, Buttocks, Right lower leg, Left lower leg     Bathing assist Assist Level: Minimal Assistance - Patient > 75%     Upper Body Dressing/Undressing Upper body dressing   What is the patient wearing?: Pull over shirt    Upper body assist Assist Level: Minimal Assistance - Patient > 75%    Lower Body Dressing/Undressing Lower body dressing      What is the patient wearing?: Pants, Underwear/pull  up     Lower body assist Assist for lower body dressing: Maximal Assistance - Patient 25 - 49%     Toileting Toileting    Toileting assist Assist for toileting: Moderate Assistance - Patient 50 - 74%     Transfers Chair/bed transfer  Transfers assist     Chair/bed transfer assist level: Moderate Assistance - Patient 50 - 74%     Locomotion Ambulation   Ambulation assist      Assist level: 2 helpers Assistive device: Hand held assist Max distance: 35   Walk 10 feet activity   Assist     Assist  level: 2 helpers Assistive device: Hand held assist   Walk 50 feet activity   Assist Walk 50 feet with 2 turns activity did not occur: Safety/medical concerns  Assist level: Dependent - Patient 0% Assistive device: Lite Gait    Walk 150 feet activity   Assist Walk 150 feet activity did not occur: Safety/medical concerns         Walk 10 feet on uneven surface  activity   Assist Walk 10 feet on uneven surfaces activity did not occur: Safety/medical concerns         Wheelchair     Assist Will patient use wheelchair at discharge?: No Type of Wheelchair: Manual    Wheelchair assist level: Supervision/Verbal cueing Max wheelchair distance: 186f    Wheelchair 50 feet with 2 turns activity    Assist        Assist Level: Supervision/Verbal cueing   Wheelchair 150 feet activity     Assist          Blood pressure 140/62, pulse (!) 55, temperature 97.6 F (36.4 C), resp. rate 20, height '5\' 6"'$  (1.676 m), weight 57.6 kg, SpO2 94 %.    Medical Problem List and Plan: 1.  Right pontine stroke             -patient may shower             -ELOS: 8/2             -husband may come in before 8am, updated him on 7/21  -Continue CIR therapies including PT, OT, and SLP   -grounds pass prn 2.  Impaired mobility: Continue Lovenox             -antiplatelet therapy: Continue ASA/Plavix 3. Cluster headaches and migraines: continue abortive oxygen PRN. Oxycodone prn for HA. Discussed outpatient Botox- patient would like to try.   7/16- will try Periactin prn daily and phenergan for nausea associated.   7/17- hasn't tried yet, but is a little better- con't regimen 4. Mood: Team to provide ego support. LCSW to follow for evaluation and support.             -antipsychotic agents: Lamictal daily.  -asked pt to let [place] if she needs help with her mood/coping.  5. Neuropsych: This patient is capable of making decisions on her own behalf. 6. Skin/Wound Care: Routine  pressure relief measures. 7. Fluids/Electrolytes/Nutrition: Monitor I/O. Check lytes in am. 8. HTN: SBP goal 140-180 range to avoid hypoperfusion of brain. Per husband "acts drunk when BP is low" --Continue Imdur, Coreg, aldactone, Demadex,  Vitals:   07/13/21 2001 07/14/21 0451  BP: (!) 151/67 140/62  Pulse: 61 (!) 55  Resp: 20 20  Temp: 98.2 F (36.8 C) 97.6 F (36.4 C)  SpO2: 95% 94%  Controlled this am but goal in 140-180 range. Decrease coreg to 3.'125mg'$  daily  Diuretic stopped but she is +7L since admit--monitor volume closely- no clinic signs of peripheral edema  9. T2DM: Hgb A1C- 10.7. BS run 200's at home. Dietary education/reinforcement.             --Continue Lantus  45 units BID with SSI for elevated BS             --7/10: hypoglycemic to 58, decrease Lantus to 35U BID CBG (last 3)  Recent Labs    07/13/21 1707 07/13/21 2113 07/14/21 0615  GLUCAP 138* 141* 76   Hypoglycemia in am reduce lantus to 22U    10. CAD s/p CABG: On ASA, coreg, Cozaar, Demadex, ASA, 11. PAD w/claudication: Has been smoking 1 PPD --quit at admission. Continue Nicotine patch. --On lyrica BID to manage symptoms.   12. Aspiration PNA: CT reviewed, appreciate medicine consult,  -wbcs down to 14.8k 7/18 -unasyn on board since 7/11 13. Chronic cluster headaches, currently well controlled: Managed with thorazine as well as oxycodone and high flow oxygen prn. Decreased oxycodone to TID PRN and edited order to indicate it should be given after therapy as per husband's request- makes her too sedated to tolerate therapy. 7/16- added Periactin prn for associated Sx's-con't oxy and O2.   7/18 may have taken periactin too late 7/16---felt hung over all day Sunday---feeling better today---would still try limit use. Continue periactin for now 14. H/o depression/anxiety: Continue Lamictal, Prozac with Klonopin prn. 15. Chronic insomnia: Managed with home dose trazodone. 16. Dyslipidemia: Trig- 636, direct  LDL-99.6, HDL 29, Chol 202. On Zetia--to start Vespc 17. Vitamin B 12 deficiency: B12- 169 (was WNL a year ago) 18. NASH/Fatty liver: LFTs reviewed and normalized 19. Dense left sided hemiplegia: [place] regarding NMES- started trials Friday. They have TENS unit at home.  20. Hypokalemia: supplement 41mq 7/7 and repeat BMP reviewed, K+ improved to 3.8. Resolved. D/c daily supplement.  21. Leukocytosis: trending upward potentially secondary to aspiration pneumonia  7/18 still some elevation today   -afebrile, no s/s on exam   -monitor serially   -hospitalist following 22. Vitamin D deficiency: continue ergocalciferol 50,000U weekly for 7 weeks.  23.  Hypoxia ,resolved  24. AKI: resolved  25. Poor perfusion of LLE  7/17- per IM, LLE is colder than RLE_ by a lot- and no DP pulse palpable- Vascular said con't current regimen and monitor closely for low perfusion injury. 7/18 leg feels warmer (by comparative accounts). Normal appearing   -recent ABI 0.46   26. Left post-stroke shoulder pain: d/c lidocaine patch and ordered voltaren gel. No c/o 7/20 27. S/p fall- lac to left knee, Head CT negative- reviewed with patient and husband. Please use [employer] for all transfers.  28. Disposition: Lives with husband and daughter (autistic but high functioning)- husband very involved and has already coordinated with his boss to take time off to care for her. Messaged April to schedule HFU/TC.    LOS: 16 days A FACE TO FACE EVALUATION WAS PERFORMED  ACharlett Blake7/22/2022, 8:25 AM

## 2021-07-14 NOTE — Progress Notes (Signed)
Occupational Therapy Session Note  Patient Details  Name: Erika [name] MRN: [id number] Date of Birth: 1971/07/15  Today's Date: 07/14/2021 OT Individual Time: 1415-1545 OT Individual Time Calculation (min): 90 min    Short Term Goals: Week 2:  OT Short Term Goal 1 (Week 2): Pt will be able to stand with min A and pull pants over hips with min A. OT Short Term Goal 2 (Week 2): Pt will complete toilet transfers with stand pivot min assist. OT Short Term Goal 3 (Week 2): Pt will donn a pullover shirt with supervision following hemi techniques.  Skilled Therapeutic Interventions/Progress Updates:    Pt sitting up in w/c, would like to defer self care and "work on walking".  Ace wrapped LLE for DF assist and provided shoe cover to improve foot clearance during gait cycle.Pt transported to large gym via w/c for time management.  Provided pt with memory compensatory technique to reduce impulsiveness prior to standing of organizing sequence of positioning into 6 steps: [employer] bottom to edge of seat Feet shoulder width apart Knees at 90 degrees Place left hand on walker splint and strap Push up with right hand from arm rest  Pt able to complete all 6 steps after a few blocked practices with mod question cues only.  Pt completed sit to stand with min assist and manual blocking of left knee.  Ambulated approximately 10 feet intervals x 3 trials with min assist +2 for w/c follow. Used visual cue of floor tiles to facilitate wider gait stance to increase BOS.  Stand to sit needing VC reminder to reach back with RUE prior to sitting to lessen speed of descent.    Pt returned to room and completed stand pivot w/c to EOB requiring min assist +2 and total assist for LLE positioning during pivot and backward stepping as well as frequent Vcs to bring RW closer to self. Sit to supine with min assist for BLE support.    Pt educated on five minute guided meditation technique in supine to  promoted improved body awareness and selective attention and prepare for LUE neuro re-ed.  Pt able to complete with independence.  Bookmarked on pts phone for HEP.  Pt then completed supine AAROM shoulder abduction/adduction 3 x 8 reps.  Next, NMES applied for unattended use: 1:1 NMES applied to supraspinatus and middle deltoid to help approximate shoulder joint to increase strength.   Ratio 1:3 Rate 35 pps Waveform- Asymmetric Ramp 1.0 Pulse 300 Intensity- 4 Duration -   20  Report of pain at the beginning of session  Report of pain at the end of session  No adverse reactions after treatment and is skin intact.   Instructed pt in supine short arm shoulder abduction HEP while NMES applied.  Call bell in reach, bed alarm on.  OT later returned to remove NMES and assess skin per above.     Therapy Documentation Precautions:  Precautions Precautions: Fall Precaution Comments: L hemiplegia Restrictions Weight Bearing Restrictions: No LUE Weight Bearing: Weight bearing as tolerated LLE Weight Bearing: Weight bearing as tolerated   Therapy/Group: Individual Therapy  Ezekiel Slocumb 07/14/2021, 5:36 PM

## 2021-07-14 NOTE — Progress Notes (Signed)
Physical Therapy Session Note  Patient Details  Name: Erika [name] MRN: [id number] Date of Birth: 28-May-1971  Today's Date: 07/14/2021 PT Individual Time: 1000-1100 PT Individual Time Calculation (min): 60 min   Short Term Goals: Week 3:  PT Short Term Goal 1 (Week 3): Pt will complete bed mobility with minA PT Short Term Goal 2 (Week 3): Pt will complete bed<>chair transfers with minA and LRAD PT Short Term Goal 3 (Week 3): Pt will ambulate 75ft with modA of 1 person and LRAD PT Short Term Goal 4 (Week 3): Husband will participate in hands on training PT Short Term Goal 5 (Week 3): Pt will initiate stair training  Skilled Therapeutic Interventions/Progress Updates:     Pt greeted seated in w/c to start session. Family (daughter and mother) at bedside. Pt agreeable to Pt tx. No reports of pain, connected to PIV in R arm. W/c transport for time management to day room rehab gym. Focused first part of session on functional gait training. Ace wrapped LLE for DF assist and provided shoe cover to improve foot clearance during gait cycle. Sit<>stand to RW with minA and requires totalA for positioning LUE in RW splint. Ambulated with minA +2 for IV pole and w/c follow ~71ft + ~25ft (seated rest). Pt demonstrated improved ability to initiate LLE swing (!!!!) and really only required assist for RW management and trunk/pelvic rotation during gait! Pt reports "that's new!" And showed excitement. Next, worked on standing tolerance and LLE weight shift in standing by completing puzzle at high/low table - required minA for standing balance and a soft L knee block as she completed this - required x1 seated rest break 2/2 fatigue after standing for ~3-4 minutes. Pt returned to her room at end of session and remained seated in w/c with safety belt alarm on, LUE trough in place, all needs met. Family at bedside who was updated on pt's progress with mobility.   Therapy Documentation Precautions:   Precautions Precautions: Fall Precaution Comments: L hemiplegia Restrictions Weight Bearing Restrictions: No LUE Weight Bearing: Weight bearing as tolerated LLE Weight Bearing: Weight bearing as tolerated General:    Therapy/Group: Individual Therapy  [name] 07/14/2021, 7:40 AM

## 2021-07-14 NOTE — Progress Notes (Signed)
Speech Language Pathology Daily Session Note  Patient Details  Name: Erika [name] MRN: [id number] Date of Birth: [birth year]  Today's Date: 07/14/2021 SLP Individual Time: 1300-1345 SLP Individual Time Calculation (min): 45 min  Short Term Goals: Week 3: SLP Short Term Goal 1 (Week 3): Patient will tolerate current diet of Dys 3 solids, thin liquids with supervision A level for following swallow safety precautions. SLP Short Term Goal 2 (Week 3): Patient will tolerate trials of regular texture solids with supervision to minA for clearing oral residuals. SLP Short Term Goal 3 (Week 3): Patient will perform mildly complex tasks in minimally distracting environment at supervision A level. SLP Short Term Goal 4 (Week 3): Patient will demonstrate awareness to errors and self correct errors with minA cues during functional tasks. SLP Short Term Goal 5 (Week 3): Patient will demonstrate anticipatory awareness abilities during simulated functional tasks with supervision A. SLP Short Term Goal 6 (Week 3): Patient will be able to direct her care during transfers with minA cues to initiate.  Skilled Therapeutic Interventions:   Patient seen for skilled ST session focusing on cognitive goals. Patient's mother and daughter present at very beginning of session but left. Patient reported she has had a good day today and that she was able to walk some with less assistance from PT. SLP introduced tasks of reading comprehension/recall and logic puzzle. Patient able to complete reading comprehension/recall task with supervision level assistance and self corrected one error and was correct on 8 of 9 questions. She required modA with logic puzzle for alternating attention, interpreting/making inferences but did improve to min-modA level at end. Patient continues to benefit from skilled SLP intervention to maximize cognitive-linguistic and swallow function prior to discharge.  Pain Pain Assessment Pain Scale:  0-10 Faces Pain Scale: No hurt  Therapy/Group: Individual Therapy  Sonia Baller, MA, CCC-SLP Speech Therapy

## 2021-07-15 LAB — GLUCOSE, CAPILLARY
Glucose-Capillary: 116 mg/dL — ABNORMAL HIGH (ref 70–99)
Glucose-Capillary: 161 mg/dL — ABNORMAL HIGH (ref 70–99)
Glucose-Capillary: 164 mg/dL — ABNORMAL HIGH (ref 70–99)
Glucose-Capillary: 127 mg/dL — ABNORMAL HIGH (ref 70–99)
Glucose-Capillary: 227 mg/dL — ABNORMAL HIGH (ref 70–99)

## 2021-07-15 MED ORDER — SORBITOL 70 % SOLN: 60.0000 mL | Freq: Every day | Status: DC | PRN

## 2021-07-15 MED ORDER — SORBITOL 70 % SOLN
60.0000 mL | Freq: Every day | Status: AC | PRN
  Administered 2021-07-15: 60 mL via ORAL
  Filled 2021-07-15: qty 60

## 2021-07-15 NOTE — Progress Notes (Signed)
PROGRESS NOTE   Subjective/Complaints:  Seen in PT, husband attending  Has persistent foot drop, does well with ACE wrap, needs AFO   ROS: Patient denies CP, SOB, N/V/D  Objective:   No results found. No results for input(s): WBC, HGB, HCT, PLT in the last 72 hours. No results for input(s): NA, K, CL, CO2, GLUCOSE, BUN, CREATININE, CALCIUM in the last 72 hours.  Intake/Output Summary (Last 24 hours) at 07/15/2021 0957 Last data filed at 07/14/2021 2129 Gross per 24 hour  Intake 555.07 ml  Output --  Net 555.07 ml          Physical Exam: Vital Signs Blood pressure (!) 147/69, pulse 70, temperature 97.8 F (36.6 C), resp. rate 18, height '5\' 6"'$  (1.676 m), weight 57.6 kg, SpO2 94 %.  General: No acute distress Mood and affect are appropriate Heart: Regular rate and rhythm no rubs murmurs or extra sounds Lungs: Clear to auscultation, breathing unlabored, no rales or wheezes Abdomen: Positive bowel sounds, soft nontender to palpation, nondistended Extremities: No clubbing, cyanosis, or edema Skin: No evidence of breakdown, no evidence of rash   Neurological:    Mental Status: She is alert and oriented to person, place, and time.    Comments: Left facial weakness with mild dysarthria. Able to answer orientation questions and follow commands without difficulty.  0/5 LUE and trace-1/5 prox to 0/5 distally LLE 4/5 on Right side    Assessment/Plan: 1. Functional deficits which require 3+ hours per day of interdisciplinary therapy in a comprehensive inpatient rehab setting. Physiatrist is providing close team supervision and 24 hour management of active medical problems listed below. Physiatrist and rehab team continue to assess barriers to discharge/monitor patient progress toward functional and medical goals  Care Tool:  Bathing    Body parts bathed by patient: Abdomen, Chest, Left arm, Right arm, Front perineal  area, Right upper leg, Left upper leg, Right lower leg, Left lower leg, Face   Body parts bathed by helper: Right arm, Buttocks, Right lower leg, Left lower leg     Bathing assist Assist Level: Minimal Assistance - Patient > 75%     Upper Body Dressing/Undressing Upper body dressing   What is the patient wearing?: Pull over shirt    Upper body assist Assist Level: Minimal Assistance - Patient > 75%    Lower Body Dressing/Undressing Lower body dressing      What is the patient wearing?: Pants, Underwear/pull up     Lower body assist Assist for lower body dressing: Maximal Assistance - Patient 25 - 49%     Toileting Toileting    Toileting assist Assist for toileting: Moderate Assistance - Patient 50 - 74%     Transfers Chair/bed transfer  Transfers assist     Chair/bed transfer assist level: Moderate Assistance - Patient 50 - 74%     Locomotion Ambulation   Ambulation assist      Assist level: 2 helpers Assistive device: Hand held assist Max distance: 35   Walk 10 feet activity   Assist     Assist level: 2 helpers Assistive device: Hand held assist   Walk 50 feet activity   Assist Walk 50  feet with 2 turns activity did not occur: Safety/medical concerns  Assist level: Dependent - Patient 0% Assistive device: Lite Gait    Walk 150 feet activity   Assist Walk 150 feet activity did not occur: Safety/medical concerns         Walk 10 feet on uneven surface  activity   Assist Walk 10 feet on uneven surfaces activity did not occur: Safety/medical concerns         Wheelchair     Assist Will patient use wheelchair at discharge?: No Type of Wheelchair: Manual    Wheelchair assist level: Supervision/Verbal cueing Max wheelchair distance: 168f    Wheelchair 50 feet with 2 turns activity    Assist        Assist Level: Supervision/Verbal cueing   Wheelchair 150 feet activity     Assist          Blood pressure  (!) 147/69, pulse 70, temperature 97.8 F (36.6 C), resp. rate 18, height '5\' 6"'$  (1.676 m), weight 57.6 kg, SpO2 94 %.    Medical Problem List and Plan: 1.  Right pontine stroke             -patient may shower             -ELOS: 8/2             -husband may come in before 8am, updated him on 7/21  -Continue CIR therapies including PT, OT, and SLP   -grounds pass prn 2.  Impaired mobility: Continue Lovenox             -antiplatelet therapy: Continue ASA/Plavix 3. Cluster headaches and migraines: continue abortive oxygen PRN. Oxycodone prn for HA. Discussed outpatient Botox- patient would like to try.   7/16- will try Periactin prn daily and phenergan for nausea associated.   7/17- hasn't tried yet, but is a little better- con't regimen 4. Mood: Team to provide ego support. LCSW to follow for evaluation and support.             -antipsychotic agents: Lamictal daily.  -asked pt to let [place] if she needs help with her mood/coping.  5. Neuropsych: This patient is capable of making decisions on her own behalf. 6. Skin/Wound Care: Routine pressure relief measures. 7. Fluids/Electrolytes/Nutrition: Monitor I/O. Check lytes in am. 8. HTN: SBP goal 140-180 range to avoid hypoperfusion of brain. Per husband "acts drunk when BP is low" --Continue Imdur, Coreg, aldactone, Demadex,  Vitals:   07/14/21 1939 07/15/21 0331  BP: (!) 154/74 (!) 147/69  Pulse: 69 70  Resp: 20 18  Temp: 98.3 F (36.8 C) 97.8 F (36.6 C)  SpO2: 97% 94%  Controlled this am but goal in 140-180 range. Decrease coreg to 3.'125mg'$  daily Now in range 7/23 9. T2DM: Hgb A1C- 10.7. BS run 200's at home. Dietary education/reinforcement.             --Continue Lantus  45 units BID with SSI for elevated BS             --7/10: hypoglycemic to 58, decrease Lantus to 35U BID CBG (last 3)  Recent Labs    07/14/21 2125 07/15/21 0612 07/15/21 0939  GLUCAP 150* 116* 161*   Hypoglycemia improved cont lantus 22U    10. CAD s/p  CABG: On ASA, coreg, Cozaar, Demadex, ASA, 11. PAD w/claudication: Has been smoking 1 PPD --quit at admission. Continue Nicotine patch. --On lyrica BID to manage symptoms.   12. Aspiration PNA: CT  reviewed, appreciate medicine consult,  -wbcs down to 14.8k 7/18 -unasyn on board since 7/11 13. Chronic cluster headaches, currently well controlled: Managed with thorazine as well as oxycodone and high flow oxygen prn. Decreased oxycodone to TID PRN and edited order to indicate it should be given after therapy as per husband's request- makes her too sedated to tolerate therapy. 7/16- added Periactin prn for associated Sx's-con't oxy and O2.   7/18 may have taken periactin too late 7/16---felt hung over all day Sunday---feeling better today---would still try limit use. Continue periactin for now 14. H/o depression/anxiety: Continue Lamictal, Prozac with Klonopin prn. 15. Chronic insomnia: Managed with home dose trazodone. 16. Dyslipidemia: Trig- 636, direct LDL-99.6, HDL 29, Chol 202. On Zetia--to start Vespc 17. Vitamin B 12 deficiency: B12- 169 (was WNL a year ago) 18. NASH/Fatty liver: LFTs reviewed and normalized 19. Dense left sided hemiplegia: [place] regarding NMES- started trials Friday. They have TENS unit at home.  20. Hypokalemia: supplement 22mq 7/7 and repeat BMP reviewed, K+ improved to 3.8. Resolved. D/c daily supplement.  21. Leukocytosis: trending upward potentially secondary to aspiration pneumonia  7/18 still some elevation today   -afebrile, no s/s on exam   -monitor serially   -hospitalist following 22. Vitamin D deficiency: continue ergocalciferol 50,000U weekly for 7 weeks.  23.  Hypoxia ,resolved  24. AKI: resolved  25. Poor perfusion of LLE  7/17- per IM, LLE is colder than RLE_ by a lot- and no DP pulse palpable- Vascular said con't current regimen and monitor closely for low perfusion injury. 7/18 leg feels warmer (by comparative accounts). Normal appearing    -recent ABI 0.46   26. Left post-stroke shoulder pain: d/c lidocaine patch and ordered voltaren gel. No c/o 7/20 27. S/p fall- lac to left knee, Head CT negative- reviewed with patient and husband. Please use [facility] for all transfers.  28. Disposition: Lives with husband and daughter (autistic but high functioning)- husband very involved and has already coordinated with his boss to take time off to care for her. Messaged April to schedule HFU/TC.    LOS: 17 days A FACE TO FACE EVALUATION WAS PERFORMED  ACharlett Blake7/23/2022, 9:57 AM

## 2021-07-15 NOTE — Progress Notes (Signed)
Physical Therapy Session Note  Patient Details  Name: Erika [name] MRN: [id number] Date of Birth: [birth year]  Today's Date: 07/15/2021 PT Individual Time: 8295-6213 PT Individual Time Calculation (min): 30 min   Short Term Goals: Week 3:  PT Short Term Goal 1 (Week 3): Pt will complete bed mobility with minA PT Short Term Goal 2 (Week 3): Pt will complete bed<>chair transfers with minA and LRAD PT Short Term Goal 3 (Week 3): Pt will ambulate 25ft with modA of 1 person and LRAD PT Short Term Goal 4 (Week 3): Husband will participate in hands on training PT Short Term Goal 5 (Week 3): Pt will initiate stair training  Skilled Therapeutic Interventions/Progress Updates:     Pt greeted sitting in recliner to start session. Husband at bedside and RN also present administering morning medications. Pt agreeable to PT tx without reports of pain. Requests to "work on walking." Discussed AFO needs and anticipated AFO consult next week - pt in agreement. Completed stand<>pivot transfer (unable to perform squat<>pivot due to room environment) from recliner to w/c with modA and no AD with L knee block. Pt able to reposition herself in w/c wtihuot assist. Wheeled to day room rehab gym for time management. Ace wrapped LLE for DF assist and provided shoe cover to allow foot clearance during gait cycle. Ambulated ~60ft + ~85ft with modA and +2 assist for w/c follow - cues for increasing L step length, maintaining wide BOS to avoid overcrowding, and to work on soft knee flexion in stance to avoid knee hyperextension. Pt returned to her room at end of session and husband remained at bedside. All needs met and husband asking to take pt around hospital and outside as they have already received a grounds pass. Instructed them to notify RN prior and they voiced understanding.   Therapy Documentation Precautions:  Precautions Precautions: Fall Precaution Comments: L hemiplegia Restrictions Weight Bearing  Restrictions: No LUE Weight Bearing: Weight bearing as tolerated LLE Weight Bearing: Weight bearing as tolerated General:     Therapy/Group: Individual Therapy  [name] 07/15/2021, 7:36 AM

## 2021-07-15 NOTE — Progress Notes (Signed)
Orthopedic Tech Progress Note Patient Details:  Erika [name] 03-26-1971 [id number] Called order into [facility]anger Patient ID: Erika [name], female   DOB: 1971/02/11, 50 y.o.   MRN: [id number]  Chip Boer 07/15/2021, 1:40 PM

## 2021-07-16 LAB — GLUCOSE, CAPILLARY
Glucose-Capillary: 95 mg/dL (ref 70–99)
Glucose-Capillary: 131 mg/dL — ABNORMAL HIGH (ref 70–99)
Glucose-Capillary: 124 mg/dL — ABNORMAL HIGH (ref 70–99)
Glucose-Capillary: 175 mg/dL — ABNORMAL HIGH (ref 70–99)

## 2021-07-16 NOTE — Progress Notes (Signed)
PROGRESS NOTE   Subjective/Complaints:  Pt an dhusband feel pt doing well in this setting and are interested in extension, amb with PT across gym yesterday with ACE wrap Left ankle /foot   ROS: Patient denies CP, SOB, N/V/D  Objective:   No results found. No results for input(s): WBC, HGB, HCT, PLT in the last 72 hours. No results for input(s): NA, K, CL, CO2, GLUCOSE, BUN, CREATININE, CALCIUM in the last 72 hours.  Intake/Output Summary (Last 24 hours) at 07/16/2021 0739 Last data filed at 07/15/2021 1900 Gross per 24 hour  Intake 360 ml  Output --  Net 360 ml          Physical Exam: Vital Signs Blood pressure 118/84, pulse 79, temperature 97.7 F (36.5 C), temperature source Oral, resp. rate (!) 21, height '5\' 6"'$  (1.676 m), weight 57.6 kg, SpO2 98 %.  General: No acute distress Mood and affect are appropriate Heart: Regular rate and rhythm no rubs murmurs or extra sounds Lungs: Clear to auscultation, breathing unlabored, no rales or wheezes Abdomen: Positive bowel sounds, soft nontender to palpation, nondistended Extremities: No clubbing, cyanosis, or edema Skin: No evidence of breakdown, no evidence of rash   Neurological:    Mental Status: She is alert and oriented to person, place, and time.    Comments: Left facial weakness with mild dysarthria. Able to answer orientation questions and follow commands without difficulty.  0/5 LUE and trace-1/5 prox to 0/5 distally LLE 4/5 on Right side    Assessment/Plan: 1. Functional deficits which require 3+ hours per day of interdisciplinary therapy in a comprehensive inpatient rehab setting. Physiatrist is providing close team supervision and 24 hour management of active medical problems listed below. Physiatrist and rehab team continue to assess barriers to discharge/monitor patient progress toward functional and medical goals  Care Tool:  Bathing    Body parts  bathed by patient: Abdomen, Chest, Left arm, Right arm, Front perineal area, Right upper leg, Left upper leg, Right lower leg, Left lower leg, Face   Body parts bathed by helper: Right arm, Buttocks, Right lower leg, Left lower leg     Bathing assist Assist Level: Minimal Assistance - Patient > 75%     Upper Body Dressing/Undressing Upper body dressing   What is the patient wearing?: Pull over shirt    Upper body assist Assist Level: Minimal Assistance - Patient > 75%    Lower Body Dressing/Undressing Lower body dressing      What is the patient wearing?: Pants, Underwear/pull up     Lower body assist Assist for lower body dressing: Maximal Assistance - Patient 25 - 49%     Toileting Toileting    Toileting assist Assist for toileting: Moderate Assistance - Patient 50 - 74%     Transfers Chair/bed transfer  Transfers assist     Chair/bed transfer assist level: Moderate Assistance - Patient 50 - 74%     Locomotion Ambulation   Ambulation assist      Assist level: 2 helpers Assistive device: Hand held assist Max distance: 35   Walk 10 feet activity   Assist     Assist level: 2 helpers Assistive device: Hand  held assist   Walk 50 feet activity   Assist Walk 50 feet with 2 turns activity did not occur: Safety/medical concerns  Assist level: Dependent - Patient 0% Assistive device: Lite Gait    Walk 150 feet activity   Assist Walk 150 feet activity did not occur: Safety/medical concerns         Walk 10 feet on uneven surface  activity   Assist Walk 10 feet on uneven surfaces activity did not occur: Safety/medical concerns         Wheelchair     Assist Will patient use wheelchair at discharge?: No Type of Wheelchair: Manual    Wheelchair assist level: Supervision/Verbal cueing Max wheelchair distance: 124f    Wheelchair 50 feet with 2 turns activity    Assist        Assist Level: Supervision/Verbal cueing    Wheelchair 150 feet activity     Assist          Blood pressure 118/84, pulse 79, temperature 97.7 F (36.5 C), temperature source Oral, resp. rate (!) 21, height '5\' 6"'$  (1.676 m), weight 57.6 kg, SpO2 98 %.    Medical Problem List and Plan: 1.  Right pontine stroke             -patient may shower             -ELOS: 8/2- pt and husband are interested in extension           -Continue CIR therapies including PT, OT, and SLP   -grounds pass prn 2.  Impaired mobility: Continue Lovenox             -antiplatelet therapy: Continue ASA/Plavix 3. Cluster headaches and migraines: continue abortive oxygen PRN. Oxycodone prn for HA. Discussed outpatient Botox- patient would like to try.   7/16- will try Periactin prn daily and phenergan for nausea associated.   7/17- hasn't tried yet, but is a little better- con't regimen 4. Mood: Team to provide ego support. LCSW to follow for evaluation and support.             -antipsychotic agents: Lamictal daily.  -asked pt to let [place] if she needs help with her mood/coping.  5. Neuropsych: This patient is capable of making decisions on her own behalf. 6. Skin/Wound Care: Routine pressure relief measures. 7. Fluids/Electrolytes/Nutrition: Monitor I/O. Check lytes in am. 8. HTN: SBP goal 140-180 range to avoid hypoperfusion of brain. Per husband "acts drunk when BP is low" --Continue Imdur, Coreg, aldactone, Demadex,  Vitals:   07/15/21 2029 07/16/21 0437  BP: 128/77 118/84  Pulse: 73 79  Resp: 19 (!) 21  Temp: 98.3 F (36.8 C) 97.7 F (36.5 C)  SpO2: 95% 98%  Controlled this am but goal in 140-180 range. Decrease coreg to 3.'125mg'$  daily, Cozaar reduce 7/20, check BMET consider reduction of spironolactone  Now in range 7/23 9. T2DM: Hgb A1C- 10.7. BS run 200's at home. Dietary education/reinforcement.             --Continue Lantus  45 units BID with SSI for elevated BS             --7/10: hypoglycemic to 58, decrease Lantus to 35U BID CBG  (last 3)  Recent Labs    07/15/21 1716 07/15/21 2130 07/16/21 0558  GLUCAP 127* 227* 95   Hypoglycemia improved cont lantus 22U    10. CAD s/p CABG, disease in LAD on last cath 08/2020: ASA,cont imdur 11. PAD w/claudication: Has  been smoking 1 PPD --quit at admission. Continue Nicotine patch. --On lyrica BID to manage symptoms.   12. Aspiration PNA: CT reviewed, appreciate medicine consult,  -wbcs down to 14.8k 7/18 -unasyn on board since 7/11 13. Chronic cluster headaches, currently well controlled: Managed with thorazine as well as oxycodone and high flow oxygen prn. Decreased oxycodone to TID PRN and edited order to indicate it should be given after therapy as per husband's request- makes her too sedated to tolerate therapy. 7/16- added Periactin prn for associated Sx's-con't oxy and O2.   7/18 may have taken periactin too late 7/16---felt hung over all day Sunday---feeling better today---would still try limit use. Continue periactin for now 14. H/o depression/anxiety: Continue Lamictal, Prozac with Klonopin prn. 15. Chronic insomnia: Managed with home dose trazodone. 16. Dyslipidemia: Trig- 636, direct LDL-99.6, HDL 29, Chol 202. On Zetia--to start Vespc 17. Vitamin B 12 deficiency: B12- 169 (was WNL a year ago) 18. NASH/Fatty liver: LFTs reviewed and normalized 19. Dense left sided hemiplegia: [place] regarding NMES- started trials Friday. They have TENS unit at home.  20. Hypokalemia: supplement 69mq 7/7 and repeat BMP reviewed, K+ improved to 3.8. Resolved. D/c daily supplement.  21. Leukocytosis: trending upward potentially secondary to aspiration pneumonia  7/18 still some elevation today   -afebrile, no s/s on exam   -monitor serially   -hospitalist following 22. Vitamin D deficiency: continue ergocalciferol 50,000U weekly for 7 weeks.  23.  Hypoxia ,resolved  24. AKI: resolved  25. Poor perfusion of LLE  7/17- per IM, LLE is colder than RLE_ by a lot- and no DP  pulse palpable- Vascular said con't current regimen and monitor closely for low perfusion injury. 7/18 leg feels warmer (by comparative accounts). Normal appearing   -recent ABI 0.46   26. Left post-stroke shoulder pain: d/c lidocaine patch and ordered voltaren gel. No c/o 7/20 27. S/p fall- lac to left knee, Head CT negative- reviewed with patient and husband. Please use [facility] for all transfers.  28. Disposition: Lives with husband and daughter (autistic but high functioning)- husband very involved and has already coordinated with his boss to take time off to care for her. Messaged April to schedule HFU/TC.    LOS: 18 days A FACE TO FACE EVALUATION WAS PERFORMED  ACharlett Blake7/24/2022, 7:39 AM

## 2021-07-16 NOTE — Progress Notes (Signed)
Occupational Therapy Session Note  Patient Details  Name: Erika [name] MRN: [id number] Date of Birth: 08-29-1971  Today's Date: 07/16/2021 OT Individual Time: 9983-3825 OT Individual Time Calculation (min): 65 min    Short Term Goals: Week 2:  OT Short Term Goal 1 (Week 2): Pt will be able to stand with min A and pull pants over hips with min A. OT Short Term Goal 2 (Week 2): Pt will complete toilet transfers with stand pivot min assist. OT Short Term Goal 3 (Week 2): Pt will donn a pullover shirt with supervision following hemi techniques.  Skilled Therapeutic Interventions/Progress Updates:    Pt received supine, agreeable to OT, reporting slight headache but improved since receiving meds. Sup>sit close spvsn with increased time using hooking technique with RLE to move LLE. Pt reported having new AFO delivered; pt attempted donning with R hand independently, but provided education to improve success (placing in tennis shoe first). Pt still needed max A to don L shoe with AFO and recommended husband bring a shoe that is one size bigger to improve AFO fit. Max A to don L sock; mod A donning R shoe; close spvsn R sock following demo of one handed technique. Sit<>stand with RW mod A with multiple attempts as pt had difficulties placing/keeping LUE on walker splint and with boost upright/coming fully into upright posture. Stand pivot transfer bed<>w/c with RW mod A for RW management, LLE management, and vc's for safety and technique. Pt's husband entered session. Pt and family education/discussion on progress made, pt personal goals, and recommendations for home safety/set up. Pt expressed desire especially regarding toileting independently at home; encouraged to ask primary therapists to help problem solve for safety at home and recommended AE, especially regarding safe transfers to [facility] with the possibility of being mod I-spvsn by d/c. W/c arm tray attached for LUE.   Pt and husband reported  increased pain and "hanging" of LUE noticed this morning; slight L shoulder subluxation palpated and encouraged pt for self-advocacy of positioning with nursing staff after therapy hours. Education on subluxation risk factors. Pt and husband asking if sling should be ordered now; will follow up with primary OT's for input. Pt taken to therapy gym to work on [place] for shoulder FLX/EXT at table top using towel slides and rolling weighted ball with L hand on top; facilitatory tactile cues required. Pt engaged in scapular elevation and isometric exercises of shoulder attempted in all directions; trace activation noted of anterior/posterior deltoid noted, slight activation of upper trapezius during elevation. Pt returned to room, remained seated EOB with husband present, alarm set, call bell in reach, and all immediate needs met.   Therapy Documentation Precautions:  Precautions Precautions: Fall Precaution Comments: L hemiplegia Restrictions Weight Bearing Restrictions: No LUE Weight Bearing: Weight bearing as tolerated LLE Weight Bearing: Weight bearing as tolerated  Pain: Pain Assessment Pain Scale: 0-10 Pain Score: 2  Pain Type: Acute pain Pain Location: Arm (triceps/posterior deltoid area) Pain Orientation: Left Pain Descriptors / Indicators: Aching Pain Frequency: Intermittent Pain Onset: With Activity Patients Stated Pain Goal: 0 Pain Intervention(s): Repositioned;Rest;Distraction Multiple Pain Sites: Yes 2nd Pain Site Pain Score:  (unrated) Pain Type: Acute pain Pain Location: Head Pain Orientation: Anterior Pain Descriptors / Indicators: Headache Pain Frequency: Intermittent Pain Onset: On-going Pain Intervention(s): Distraction;Rest    Therapy/Group: Individual Therapy  Mellissa Kohut 07/16/2021, 12:41 PM

## 2021-07-17 LAB — BASIC METABOLIC PANEL
Anion gap: 9 (ref 5–15)
BUN: 13 mg/dL (ref 6–20)
CO2: 32 mmol/L (ref 22–32)
Calcium: 10.2 mg/dL (ref 8.9–10.3)
Chloride: 96 mmol/L — ABNORMAL LOW (ref 98–111)
Creatinine, Ser: 0.87 mg/dL (ref 0.44–1.00)
GFR, Estimated: >60 mL/min (ref >60)
Glucose, Bld: 98 mg/dL (ref 70–99)
Potassium: 3.7 mmol/L (ref 3.5–5.1)
Sodium: 137 mmol/L (ref 135–145)

## 2021-07-17 LAB — CBC
HCT: 40.6 % (ref 36.0–46.0)
Hemoglobin: 12.9 g/dL (ref 12.0–15.0)
MCH: 29.3 pg (ref 26.0–34.0)
MCHC: 31.8 g/dL (ref 30.0–36.0)
MCV: 92.3 fL (ref 80.0–100.0)
Platelets: 281 10*3/uL (ref 150–400)
RBC: 4.4 MIL/uL (ref 3.87–5.11)
RDW: 13.3 % (ref 11.5–15.5)
WBC: 10.8 10*3/uL — ABNORMAL HIGH (ref 4.0–10.5)
nRBC: 0 % (ref 0.0–0.2)

## 2021-07-17 LAB — GLUCOSE, CAPILLARY
Glucose-Capillary: 83 mg/dL (ref 70–99)
Glucose-Capillary: 151 mg/dL — ABNORMAL HIGH (ref 70–99)
Glucose-Capillary: 197 mg/dL — ABNORMAL HIGH (ref 70–99)
Glucose-Capillary: 203 mg/dL — ABNORMAL HIGH (ref 70–99)

## 2021-07-17 MED ORDER — POTASSIUM CHLORIDE 20 MEQ PO PACK
20.0000 meq | PACK | Freq: Once | ORAL | Status: AC
  Administered 2021-07-17: 20 meq via ORAL
  Filled 2021-07-17: qty 1

## 2021-07-17 NOTE — Progress Notes (Signed)
Speech Language Pathology Daily Session Note  Patient Details  Name: LARRISSA REIFSCHNEIDER MRN: [id number] Date of Birth: [birth year]  Today's Date: 07/17/2021 SLP Individual Time: 1300-1330 SLP Individual Time Calculation (min): 30 min  Short Term Goals: Week 3: SLP Short Term Goal 1 (Week 3): Patient will tolerate current diet of Dys 3 solids, thin liquids with supervision A level for following swallow safety precautions. SLP Short Term Goal 2 (Week 3): Patient will tolerate trials of regular texture solids with supervision to minA for clearing oral residuals. SLP Short Term Goal 3 (Week 3): Patient will perform mildly complex tasks in minimally distracting environment at supervision A level. SLP Short Term Goal 4 (Week 3): Patient will demonstrate awareness to errors and self correct errors with minA cues during functional tasks. SLP Short Term Goal 5 (Week 3): Patient will demonstrate anticipatory awareness abilities during simulated functional tasks with supervision A. SLP Short Term Goal 6 (Week 3): Patient will be able to direct her care during transfers with minA cues to initiate.  Skilled Therapeutic Interventions: Skilled treatment session focused on cognitive goals. SLP facilitated session by providing Min A verbal and visual cues to self-monitor and correct impulsivity and to maximize organization and problem solving during a complex medication management task in which patient had to identify errors. Patient left upright in recliner with all needs within reach. Continue with current plan of care.      Pain No/Denies Pain   Therapy/Group: Individual Therapy  Kingstin Heims 07/17/2021, 1:31 PM

## 2021-07-17 NOTE — Progress Notes (Signed)
Physical Therapy Session Note  Patient Details  Name: Erika [name] MRN: [id number] Date of Birth: October 16, 1971  Today's Date: 07/17/2021 PT Individual Time: 1102-1200 + 1330-1415 PT Individual Time Calculation (min): 58 min  + 45 min  Short Term Goals: Week 3:  PT Short Term Goal 1 (Week 3): Pt will complete bed mobility with minA PT Short Term Goal 2 (Week 3): Pt will complete bed<>chair transfers with minA and LRAD PT Short Term Goal 3 (Week 3): Pt will ambulate 31f with modA of 1 person and LRAD PT Short Term Goal 4 (Week 3): Husband will participate in hands on training PT Short Term Goal 5 (Week 3): Pt will initiate stair training  Skilled Therapeutic Interventions/Progress Updates:     1st session: Pt greeted seated in w/c to start session. Agreeable to PT tx and reports no pain. Mother in law at bedside. Pt reports an AFO was delivered to her room over the weekend - AFO is off the shelf PLS. [facility] to [facility] needs and scheduler made aware. Pt wheeled to day room rehab gym for time management. Donned tennis shoes with L AFO with totalA for time management.  Worked on functional transfers from w/c to mat table and back. When asked, pt reports she prefers stand<>pivot rather than squat<>pivot. Educated on safety precautions and falls risk and worked on blocked practice squat<>pivot transfers where she required minA for technique but mod cues for setup and w/c management. Next, worked on repeated sit<>stands from mat table height with no AD and minA overall for balance > power. Next, worked on functional gait training with AFO where she ambulated 272f+ 2070fith modA and RW - requires assist primarily for RW management and truncal balance as she's able to lift and swing LLE without assist but lacks sufficient hip/knee flexor strength. She also requires nearly maxA for turning to sit to sitting surface and lacks  ability to step LLE backwards to prepare to sit. Next, wheeled to [place] she completed 8 minutes at 40cm/sec resistance, working on alternating movement patterns and AAROM for LLE hip flexion/extension. Pt was wheeled back to her room where she requested to sit in the recliner. Completed squat<>pivot transfer with minA to recliner from w/c. She remained seated in recliner with all needs met and mother in law at bedside.   2nd session: Pt greeted seated in recliner to start session. Agreeable to therapy and no reports of pain. Completes squat<>pivot transfer with modA from recliner to w/c, towards paretic L side. Able to reposition self in w/c with cues only. Retrieved kinesiotape to apply to L shldr to facilitate approximation and reduce subluxation - cut to length. Transported pt outdoors (via [employer] enjoy fresh air while taping to allow improved confidence and participation - pt thankful. Taped L shldr over deltoid and supraspinatus to facilitate approximation. While outdoors, completed repeated sit<>stands to flower bed (waist high) and also provided passive heel cord stretch and hamstring stretch to improve carryover into functional movement. While outdoors, also discussed biopsychosocial concerns related to her stroke and how this impacts her life moving forward - pt appreciative. Wheeled back upstairs and pt requesting to return to bed. Completes squat<>pivot transfer with modA from w/c to EOB and requires min/modA for sit>supine for trunk and LLE management. She remained supine in bed at end of session with bed alarm on and LUE supported with pillow. All needs met.  Therapy Documentation Precautions:  Precautions Precautions: Fall Precaution Comments: L hemiplegia Restrictions Weight Bearing Restrictions: No LUE Weight Bearing: Weight bearing as tolerated LLE Weight Bearing: Weight bearing as tolerated General:    Therapy/Group: Individual Therapy  [name] 07/17/2021, 7:43 AM

## 2021-07-17 NOTE — Progress Notes (Signed)
Occupational Therapy Session Note  Patient Details  Name: Erika [name] MRN: [id number] Date of Birth: 12-23-1971  Today's Date: 07/17/2021 OT Individual Time: 3953-2023 OT Individual Time Calculation (min): 40 min    Short Term Goals: Week 2:  OT Short Term Goal 1 (Week 2): Pt will be able to stand with min A and pull pants over hips with min A. OT Short Term Goal 1 - Progress (Week 2): Met OT Short Term Goal 2 (Week 2): Pt will complete toilet transfers with stand pivot min assist. OT Short Term Goal 2 - Progress (Week 2): Met OT Short Term Goal 3 (Week 2): Pt will donn a pullover shirt with supervision following hemi techniques. OT Short Term Goal 3 - Progress (Week 2): Progressing toward goal  Skilled Therapeutic Interventions/Progress Updates:    Pt received supine, husband present, agreeable to unscheduled OT session, reporting no pain during session. Session focused on BADLs, LUE/LLE NMR, functional transfers, and standing balance. Pt reported wanting her haircut and missing scheduled appointment due to CVA; pt agreeable to hair cut by OT. Scooting HOB +2 with husband mod A. Sup>sit with rolling L technique mod A with min vc's for technique. Squat pivot transfer CGA bed>w/c with min vc's. Pt sat in w/c at sink as OT cut hair; engaged in LUE NMR with lateral towel slides on sink HHA to improve muscle activation in LUE. Pt practiced verbally repeating steps for sit<>stand with RW and walker splint with no errors. Sit<>stand with RW min A after 2 trials. Pt tolerated standing with RW ~4 minutes with intermittent mod A for balance with visual feedback from mirror encouraged. Engaged in dynamic balance weightshifting laterally and anterior/posteriorly; pt compensating by either moving head/trunk or hips instead of fully shifting weight. However, occasionally required mod-max A for L leaning correction as pt L knee buckling or during attempts to move LLE laterally to increase BOS. Pt  reported increased fear of falling since pt reported fall with nursing during transfer from bed>BSC. Pt and family education on fall prevention, POC, progress made, and home set up options. Pt and husband asking about bed height recommendations as current bed is 42"; recommended possible need for step stool and to consult with primary PT/OT.  Pt remained seated in w/c with husband present, alarm not set as husband and pt planning to go outside using grounds pass; all immediate needs met.   Therapy Documentation Precautions:  Precautions Precautions: Fall Precaution Comments: L hemiplegia Restrictions Weight Bearing Restrictions: No LUE Weight Bearing: Weight bearing as tolerated LLE Weight Bearing: Weight bearing as tolerated  Pain: Pain Assessment Pain Scale: 0-10 Pain Score: 0-No pain   Therapy/Group: Individual Therapy  Mellissa Kohut 07/17/2021, 4:34 PM

## 2021-07-17 NOTE — Progress Notes (Signed)
Occupational Therapy Weekly Progress Note  Patient Details  Name: Erika [name] MRN: [id number] Date of Birth: [birth year]  Beginning of progress report period: July 10, 2021 End of progress report period: July 17, 2021  Today's Date: 07/17/2021 OT Individual Time: 8315-1761 OT Individual Time Calculation (min): 90 min    Patient has met 3 of 4 short term goals.  Pt is progressing steadily towards goals and is very motivated to participate during therapy with strong desire to return to PLOF.  Pt exhibits decreased impulsiveness and improved overall functional mobility requiring min assist for stand pivot and sit<>stands.  Pt requires repetitive training to facilitate increase carryover with hemi techniques due to impaired memory but she is slowly increasing her independence with self care and is now CGA for bathing in seated position and min assist for most dressing.  Pt still needs training on use of reacher and also compensatory techniques to donn shoe with AFO.  Minimal activation of musculature in LUE noted except for trace in deltoids, suprispinatus, rhomboids, upper traps, levator scapulae. Increased subacromial space noted today accompanied by pain.  Pt may benefit from therapeutic measures including ktaping, continued nmes, and intermittent sling use to reduce pain and promote joint approximation.    Patient continues to demonstrate the following deficits: muscle weakness, decreased coordination and decreased motor planning, decreased attention, decreased awareness, decreased problem solving, decreased safety awareness, decreased memory, and delayed processing, and decreased sitting balance, decreased standing balance, and decreased postural control and therefore will continue to benefit from skilled OT intervention to enhance overall performance with BADL.  Patient progressing toward long term goals..  Continue plan of care.  OT Short Term Goals Week 2:  OT Short Term Goal 1 (Week  2): Pt will be able to stand with min A and pull pants over hips with min A. OT Short Term Goal 1 - Progress (Week 2): Met OT Short Term Goal 2 (Week 2): Pt will complete toilet transfers with stand pivot min assist. OT Short Term Goal 2 - Progress (Week 2): Met OT Short Term Goal 3 (Week 2): Pt will donn a pullover shirt with supervision following hemi techniques. OT Short Term Goal 3 - Progress (Week 2): Progressing toward goal Week 3:  OT Short Term Goal 1 (Week 3): STGs=LTGs due to ELOS   Skilled Therapeutic Interventions/Progress Updates:    Pt sitting up in recliner, mother in law present.  C/o mild pain in left shoulder but already had voltaren gel applied.  Requesting to wash her hair and use toilet this AM.  Mod question cues needed to facilitate pt recall of 6-steps of good body mechanics prior to standing.  Stand pivot without AD to w/c with min assist (and increased time) for some manual positioning of LLE and weightshifting.  Pt had increased hip adduction and extension in LLE in order to assist during transfers.  Pt propelled using LUE/LLE to bathroom and completed stand pivot w/c to 3 in 1 commode using grab bar with min assist in similar way as prior mentioned.  Pt also required only min question cues this time to recall body mechanics prior to standing.  Pt initially attempted to pull shorts down prior to pivoting and needed Vcs to correct.  Pt able to complete pericare and clothing mgt with min assist.  Pt ambulated from toilet to shower bench approximately 5 feet using RW with mod assist for manual positioning of LLE, weight shifting, and RW management intermittently.  Stand to sit with  min assist using grab bar.  Pt doffed shirt with distant supervision.  Pt bathed UB/LB using long handled sponge with CGA seated on shower bench.  Pt donned shirt after drying off without using hemi technique resulting in left arm unable to thread. Provided one question cue and pt able to problem solve  and attempt again using hemitechnique with better success.  Pt required min assist to thread shorts over LLE and one verbal cue to initiate hemi technique.  Stand pivot min assist to w/c.  Left arm trough donned, call bell in reach, seat alarm on.    Therapy Documentation Precautions:  Precautions Precautions: Fall Precaution Comments: L hemiplegia Restrictions Weight Bearing Restrictions: No LUE Weight Bearing: Weight bearing as tolerated LLE Weight Bearing: Weight bearing as tolerated   Therapy/Group: Individual Therapy  Ezekiel Slocumb 07/17/2021, 12:39 PM

## 2021-07-17 NOTE — Progress Notes (Signed)
PROGRESS NOTE   Subjective/Complaints: WBC 10.8, continues to trend downward.  K+ 3.7- will supplement She expresses no complaints OT notes she has complained of left shoulder pain due to subluxation.   ROS: Patient denies CP, SOB, N/V/D, +left shoulder pain.   Objective:   No results found. Recent Labs    07/17/21 0329  WBC 10.8*  HGB 12.9  HCT 40.6  PLT 281   Recent Labs    07/17/21 0329  NA 137  K 3.7  CL 96*  CO2 32  GLUCOSE 98  BUN 13  CREATININE 0.87  CALCIUM 10.2    Intake/Output Summary (Last 24 hours) at 07/17/2021 1007 Last data filed at 07/17/2021 0813 Gross per 24 hour  Intake 480 ml  Output --  Net 480 ml         Physical Exam: Vital Signs Blood pressure (!) 164/87, pulse 74, temperature (!) 97.5 F (36.4 C), temperature source Oral, resp. rate 18, height '5\' 6"'$  (1.676 m), weight 57.6 kg, SpO2 97 %.  Gen: no distress, normal appearing HEENT: oral mucosa pink and moist, NCAT Cardio: Reg rate Chest: normal effort, normal rate of breathing Abd: soft, non-distended Ext: no edema Psych: pleasant, normal affect Skin: intact  Neurological:    Mental Status: She is alert and oriented to person, place, and time.    Comments: Left facial weakness with mild dysarthria. Able to answer orientation questions and follow commands without difficulty.  0/5 LUE and trace-1/5 prox to 0/5 distally LLE 4/5 on Right side    Assessment/Plan: 1. Functional deficits which require 3+ hours per day of interdisciplinary therapy in a comprehensive inpatient rehab setting. Physiatrist is providing close team supervision and 24 hour management of active medical problems listed below. Physiatrist and rehab team continue to assess barriers to discharge/monitor patient progress toward functional and medical goals  Care Tool:  Bathing    Body parts bathed by patient: Abdomen, Chest, Left arm, Right arm, Front  perineal area, Right upper leg, Left upper leg, Right lower leg, Left lower leg, Face   Body parts bathed by helper: Right arm, Buttocks, Right lower leg, Left lower leg     Bathing assist Assist Level: Minimal Assistance - Patient > 75%     Upper Body Dressing/Undressing Upper body dressing   What is the patient wearing?: Pull over shirt    Upper body assist Assist Level: Minimal Assistance - Patient > 75%    Lower Body Dressing/Undressing Lower body dressing      What is the patient wearing?: Pants, Underwear/pull up     Lower body assist Assist for lower body dressing: Maximal Assistance - Patient 25 - 49%     Toileting Toileting    Toileting assist Assist for toileting: Moderate Assistance - Patient 50 - 74%     Transfers Chair/bed transfer  Transfers assist     Chair/bed transfer assist level: Moderate Assistance - Patient 50 - 74%     Locomotion Ambulation   Ambulation assist      Assist level: 2 helpers Assistive device: Hand held assist Max distance: 35   Walk 10 feet activity   Assist     Assist  level: 2 helpers Assistive device: Hand held assist   Walk 50 feet activity   Assist Walk 50 feet with 2 turns activity did not occur: Safety/medical concerns  Assist level: Dependent - Patient 0% Assistive device: Lite Gait    Walk 150 feet activity   Assist Walk 150 feet activity did not occur: Safety/medical concerns         Walk 10 feet on uneven surface  activity   Assist Walk 10 feet on uneven surfaces activity did not occur: Safety/medical concerns         Wheelchair     Assist Will patient use wheelchair at discharge?: No Type of Wheelchair: Manual    Wheelchair assist level: Supervision/Verbal cueing Max wheelchair distance: 14f    Wheelchair 50 feet with 2 turns activity    Assist        Assist Level: Supervision/Verbal cueing   Wheelchair 150 feet activity     Assist          Blood  pressure (!) 164/87, pulse 74, temperature (!) 97.5 F (36.4 C), temperature source Oral, resp. rate 18, height '5\' 6"'$  (1.676 m), weight 57.6 kg, SpO2 97 %.    Medical Problem List and Plan: 1.  Right pontine stroke             -patient may shower             -ELOS: 8/2             -husband may come in before 8am, updated mother-in law 7/25  -Continue CIR therapies including PT, OT, and SLP   -grounds pass prn 2.  Impaired mobility: Continue Lovenox             -antiplatelet therapy: Continue ASA/Plavix 3. Cluster headaches and migraines: continue abortive oxygen PRN. Oxycodone prn for HA. Discussed outpatient Botox- patient would like to try.   7/16- will try Periactin prn daily and phenergan for nausea associated.   7/17- hasn't tried yet, but is a little better- con't regimen 4. Mood: Team to provide ego support. LCSW to follow for evaluation and support.             -antipsychotic agents: Lamictal daily.  -asked pt to let [place] if she needs help with her mood/coping.  5. Neuropsych: This patient is capable of making decisions on her own behalf. 6. Skin/Wound Care: Routine pressure relief measures. 7. Fluids/Electrolytes/Nutrition: Monitor I/O. Check lytes in am. 8. HTN: SBP goal 140-180 range to avoid hypoperfusion of brain. Per husband "acts drunk when BP is low" --Continue Imdur, Coreg, aldactone, Demadex,  Vitals:   07/17/21 0429 07/17/21 0830  BP: (!) 149/77 (!) 164/87  Pulse: 72 74  Resp: 16 18  Temp: 97.8 F (36.6 C) (!) 97.5 F (36.4 C)  SpO2: 96% 97%  Controlled this am but goal in 140-180 range. Decrease coreg to 3.'125mg'$  daily Now in range 7/23 9. T2DM: Hgb A1C- 10.7. BS run 200's at home. Dietary education/reinforcement.             --Continue Lantus  45 units BID with SSI for elevated BS             --7/10: hypoglycemic to 58, decrease Lantus to 35U BID CBG (last 3)  Recent Labs    07/16/21 1709 07/16/21 2131 07/17/21 0557  GLUCAP 124* 175* 83   Hypoglycemia improved cont lantus 22U    10. CAD s/p CABG: On ASA, coreg, Cozaar, Demadex, ASA, 11. PAD w/claudication:  Has been smoking 1 PPD --quit at admission. Continue Nicotine patch. --On lyrica BID to manage symptoms.   12. Aspiration PNA: CT reviewed, appreciate medicine consult,  -wbcs down to 14.8k 7/18 -unasyn on board since 7/11 13. Chronic cluster headaches, currently well controlled: Managed with thorazine as well as oxycodone and high flow oxygen prn. Decreased oxycodone to TID PRN and edited order to indicate it should be given after therapy as per husband's request- makes her too sedated to tolerate therapy. 7/16- added Periactin prn for associated Sx's-con't oxy and O2.   7/18 may have taken periactin too late 7/16---felt hung over all day Sunday---feeling better today---would still try limit use. Continue periactin for now 14. H/o depression/anxiety: Continue Lamictal, Prozac with Klonopin prn. 15. Chronic insomnia: Managed with home dose trazodone. 16. Dyslipidemia: Trig- 636, direct LDL-99.6, HDL 29, Chol 202. On Zetia--to start Vespc 17. Vitamin B 12 deficiency: B12- 169 (was WNL a year ago) 18. NASH/Fatty liver: LFTs reviewed and normalized 19. Dense left sided hemiplegia: [place] regarding NMES- started trials Friday. They have TENS unit at home.  20. Hypokalemia: supplement 48mq 7/7 and repeat BMP reviewed, K+ improved to 3.8. Resolved. D/c daily supplement.  21. Leukocytosis: trending upward potentially secondary to aspiration pneumonia  7/18 still some elevation today   -afebrile, no s/s on exam   -monitor serially   -hospitalist following 22. Vitamin D deficiency: continue ergocalciferol 50,000U weekly for 7 weeks.  23.  Hypoxia ,resolved  24. AKI: resolved  25. Poor perfusion of LLE  7/17- per IM, LLE is colder than RLE_ by a lot- and no DP pulse palpable- Vascular said con't current regimen and monitor closely for low perfusion injury. 7/18 leg  feels warmer (by comparative accounts). Normal appearing   -recent ABI 0.46   26. Left post-stroke shoulder pain: d/c lidocaine patch and ordered voltaren gel. Apply kinesiology tape 27. S/p fall- lac to left knee, Head CT negative- reviewed with patient and husband. Please use Stedy for all transfers.  28. Hypokalemia: supplement 230m on 7/25, monitor K+ weekly.  29. Disposition: Lives with husband and daughter (autistic but high functioning)- husband very involved and has already coordinated with his boss to take time off to care for her. Messaged April to schedule HFU/TC.    LOS: 19 days A FACE TO FACE EVALUATION WAS PERFORMED  Cheikh Bramble P Keshana Klemz 07/17/2021, 10:07 AM

## 2021-07-18 ENCOUNTER — Encounter: Payer: Self-pay | Admitting: Physical Medicine and Rehabilitation

## 2021-07-18 LAB — GLUCOSE, CAPILLARY
Glucose-Capillary: 112 mg/dL — ABNORMAL HIGH (ref 70–99)
Glucose-Capillary: 178 mg/dL — ABNORMAL HIGH (ref 70–99)
Glucose-Capillary: 145 mg/dL — ABNORMAL HIGH (ref 70–99)
Glucose-Capillary: 292 mg/dL — ABNORMAL HIGH (ref 70–99)

## 2021-07-18 MED ORDER — SODIUM CHLORIDE 0.9% FLUSH: 10.0000 mL | INTRAVENOUS | Status: DC | PRN

## 2021-07-18 MED ORDER — SODIUM CHLORIDE 0.9% FLUSH
10.0000 mL | Freq: Two times a day (BID) | INTRAVENOUS | Status: DC
Start: 2021-07-18 — End: 2021-07-21
  Administered 2021-07-18 – 2021-07-19 (×3): 10 mL

## 2021-07-18 MED ORDER — CLONAZEPAM 0.5 MG PO TABS
0.5000 mg | ORAL_TABLET | Freq: Every day | ORAL | Status: DC
  Administered 2021-07-18 – 2021-07-31 (×14): 0.5 mg via ORAL
  Filled 2021-07-18 (×14): qty 1

## 2021-07-18 NOTE — Progress Notes (Signed)
PROGRESS NOTE   Subjective/Complaints: Husband notes depression and anxiety. Reviewed current medications she is receiving for these conditions. Discussed together scheduling Klonopin at bedtime to help anxiety and sleep  ROS: Patient denies CP, SOB, N/V/D, +left shoulder pain, +anxiety  Objective:   No results found. Recent Labs    07/17/21 0329  WBC 10.8*  HGB 12.9  HCT 40.6  PLT 281   Recent Labs    07/17/21 0329  NA 137  K 3.7  CL 96*  CO2 32  GLUCOSE 98  BUN 13  CREATININE 0.87  CALCIUM 10.2    Intake/Output Summary (Last 24 hours) at 07/18/2021 1006 Last data filed at 07/18/2021 0849 Gross per 24 hour  Intake 540 ml  Output --  Net 540 ml         Physical Exam: Vital Signs Blood pressure (!) 166/86, pulse 86, temperature 97.9 F (36.6 C), temperature source Oral, resp. rate 19, height '5\' 6"'$  (1.676 m), weight 57.6 kg, SpO2 94 %. Gen: no distress, normal appearing HEENT: oral mucosa pink and moist, NCAT Cardio: Reg rate Chest: normal effort, normal rate of breathing Abd: soft, non-distended Ext: no edema Psych: pleasant, normal affect Skin: intact  Neurological:    Mental Status: She is alert and oriented to person, place, and time.    Comments: Left facial weakness with mild dysarthria. Able to answer orientation questions and follow commands without difficulty.  0/5 LUE and trace-1/5 prox to 0/5 distally LLE 4/5 on Right side    Assessment/Plan: 1. Functional deficits which require 3+ hours per day of interdisciplinary therapy in a comprehensive inpatient rehab setting. Physiatrist is providing close team supervision and 24 hour management of active medical problems listed below. Physiatrist and rehab team continue to assess barriers to discharge/monitor patient progress toward functional and medical goals  Care Tool:  Bathing    Body parts bathed by patient: Abdomen, Chest, Left arm,  Right arm, Front perineal area, Right upper leg, Left upper leg, Right lower leg, Left lower leg, Face, Buttocks   Body parts bathed by helper: Right arm, Buttocks, Right lower leg, Left lower leg     Bathing assist Assist Level: Contact Guard/Touching assist     Upper Body Dressing/Undressing Upper body dressing   What is the patient wearing?: Pull over shirt    Upper body assist Assist Level: Minimal Assistance - Patient > 75%    Lower Body Dressing/Undressing Lower body dressing      What is the patient wearing?: Pants     Lower body assist Assist for lower body dressing: Minimal Assistance - Patient > 75%     Toileting Toileting    Toileting assist Assist for toileting: Minimal Assistance - Patient > 75%     Transfers Chair/bed transfer  Transfers assist     Chair/bed transfer assist level: Moderate Assistance - Patient 50 - 74%     Locomotion Ambulation   Ambulation assist      Assist level: 2 helpers Assistive device: Hand held assist Max distance: 35   Walk 10 feet activity   Assist     Assist level: 2 helpers Assistive device: Hand held assist   Walk  50 feet activity   Assist Walk 50 feet with 2 turns activity did not occur: Safety/medical concerns  Assist level: Dependent - Patient 0% Assistive device: Lite Gait    Walk 150 feet activity   Assist Walk 150 feet activity did not occur: Safety/medical concerns         Walk 10 feet on uneven surface  activity   Assist Walk 10 feet on uneven surfaces activity did not occur: Safety/medical concerns         Wheelchair     Assist Will patient use wheelchair at discharge?: No Type of Wheelchair: Manual    Wheelchair assist level: Supervision/Verbal cueing Max wheelchair distance: 149f    Wheelchair 50 feet with 2 turns activity    Assist        Assist Level: Supervision/Verbal cueing   Wheelchair 150 feet activity     Assist          Blood  pressure (!) 166/86, pulse 86, temperature 97.9 F (36.6 C), temperature source Oral, resp. rate 19, height '5\' 6"'$  (1.676 m), weight 57.6 kg, SpO2 94 %.    Medical Problem List and Plan: 1.  Right pontine stroke             -patient may shower             -ELOS: 8/2             -husband may come in before 8am, updated mother-in law 7/25  -Continue CIR therapies including PT, OT, and SLP   -grounds pass prn  -Interdisciplinary Team Conference today   2.  Impaired mobility: Continue Lovenox             -antiplatelet therapy: Continue ASA/Plavix 3. Cluster headaches and migraines: continue abortive oxygen PRN. Oxycodone prn for HA. Discussed outpatient Botox- patient would like to try. D/c periactin and phenergan as patient not using 4. Mood: Team to provide ego support. LCSW to follow for evaluation and support.             -antipsychotic agents: Lamictal daily.  -asked pt to let u[place]ore[place]now if she needs help with her mood/coping.  5. Neuropsych: This patient is capable of making decisions on her own behalf. 6. Skin/Wound Care: Routine pressure relief measures. 7. Fluids/Electrolytes/Nutrition: Monitor I/O. Check lytes in am. 8. HTN: SBP goal 140-180 range to avoid hypoperfusion of brain. Per husband "acts drunk when BP is low" --At goal, continue Imdur, Coreg, aldactone, Demadex,  Vitals:   07/18/21 0404 07/18/21 0822  BP: (!) 155/86 (!) 166/86  Pulse: 93 86  Resp: 17 19  Temp: 98.3 F (36.8 C) 97.9 F (36.6 C)  SpO2: 94% 94%  Decrease coreg to 3.'125mg'$  daily 9. T2DM: Hgb A1C- 10.7. BS run 200's at home. Dietary education/reinforcement.             --Continue Lantus  45 units BID with SSI for elevated BS             Continue decreased Lantus to 22U BID CBG (last 3)  Recent Labs    07/17/21 1714 07/17/21 2046 07/18/21 0636  GLUCAP 197* 203* 112*    10. CAD s/p CABG: On ASA, coreg, Cozaar, Demadex, ASA, 11. PAD w/claudication: Has been smoking 1 PPD --quit at admission. Continue  Nicotine patch. --On lyrica BID to manage symptoms.   12. Aspiration PNA: CT reviewed, appreciate medicine consult,  -wbcs down to 14.8k 7/18 -unasyn on board since 7/11 13. Chronic cluster headaches, currently  well controlled: Managed with thorazine as well as oxycodone and high flow oxygen prn. Decreased oxycodone to TID PRN and edited order to indicate it should be given after therapy as per husband's request- makes her too sedated to tolerate therapy. 7/16- added Periactin prn for associated Sx's-con't oxy and O2.   7/18 may have taken periactin too late 7/16---felt hung over all day Sunday---feeling better today---would still try limit use. Continue periactin for now 14. H/o depression/anxiety: Continue Lamictal, Prozac with Klonopin prn. 15. Chronic insomnia: Managed with home dose trazodone. 16. Dyslipidemia: Trig- 636, direct LDL-99.6, HDL 29, Chol 202. On Zetia--to start Vespc 17. Vitamin B 12 deficiency: B12- 169 (was WNL a year ago) 18. NASH/Fatty liver: LFTs reviewed and normalized 19. Dense left sided hemiplegia: [place] regarding NMES- started trials Friday. They have TENS unit at home.  20. Hypokalemia: supplement 75mq 7/7 and repeat BMP reviewed, K+ improved to 3.8. Resolved. D/c daily supplement.  21. Leukocytosis: trending upward potentially secondary to aspiration pneumonia  7/18 still some elevation today   -afebrile, no s/s on exam   -monitor serially   -hospitalist following 22. Vitamin D deficiency: continue ergocalciferol 50,000U weekly for 7 weeks.  23.  Hypoxia ,resolved  24. AKI: resolved  25. Poor perfusion of LLE  7/17- per IM, LLE is colder than RLE_ by a lot- and no DP pulse palpable- Vascular said con't current regimen and monitor closely for low perfusion injury. 7/18 leg feels warmer (by comparative accounts). Normal appearing   -recent ABI 0.46   26. Left post-stroke shoulder pain: d/c lidocaine patch and ordered voltaren gel. Apply kinesiology  tape 27. S/p fall- lac to left knee, Head CT negative- reviewed with patient and husband. Please use Stedy for all transfers.  28. Hypokalemia: supplement 236m on 7/25, monitor K+ weekly.  29. Disposition: Lives with husband and daughter (autistic but high functioning)- husband very involved and has already coordinated with his boss to take time off to care for her. Messaged April to schedule HFU/TC.    LOS: 20 days A FACE TO FACE EVALUATION WAS PERFORMED  Rhiannan Kievit P Dereka Lueras 07/18/2021, 10:06 AM

## 2021-07-18 NOTE — Progress Notes (Signed)
Speech Language Pathology Daily Session Note  Patient Details  Name: Erika [name] MRN: [id number] Date of Birth: 1971-05-06  Today's Date: 07/18/2021 SLP Individual Time: [id number] SLP Individual Time Calculation (min): 25 min  Short Term Goals: Week 3: SLP Short Term Goal 1 (Week 3): Patient will tolerate current diet of Dys 3 solids, thin liquids with supervision A level for following swallow safety precautions. SLP Short Term Goal 2 (Week 3): Patient will tolerate trials of regular texture solids with supervision to minA for clearing oral residuals. SLP Short Term Goal 3 (Week 3): Patient will perform mildly complex tasks in minimally distracting environment at supervision A level. SLP Short Term Goal 4 (Week 3): Patient will demonstrate awareness to errors and self correct errors with minA cues during functional tasks. SLP Short Term Goal 5 (Week 3): Patient will demonstrate anticipatory awareness abilities during simulated functional tasks with supervision A. SLP Short Term Goal 6 (Week 3): Patient will be able to direct her care during transfers with minA cues to initiate.  Skilled Therapeutic Interventions: Skilled treatment session focused on dysphagia goals. SLP facilitated session by providing trials of regular textures. Patient demonstrated efficient mastication with complete oral clearance without overt s/s of aspiration. Patient only required minimal cues to minimize talking with a full oral cavity. Recommend trial tray prior to upgrade. Patient left upright in the wheelchair with alarm on and all needs within reach. Continue with current plan of care.      Pain No/Denies Pain   Therapy/Group: Individual Therapy  Erika [name] 07/18/2021, 3:02 PM

## 2021-07-18 NOTE — Progress Notes (Signed)
Occupational Therapy Session Note  Patient Details  Name: Erika [name] MRN: [id number] Date of Birth: Dec 17, 1971  Today's Date: 07/18/2021 OT Individual Time: 0904-1005 OT Individual Time Calculation (min): 61 min    Short Term Goals:  Week 3:  OT Short Term Goal 1 (Week 3): STGs=LTGs due to ELOS  Skilled Therapeutic Interventions/Progress Updates:    Pt sitting up in w/c just finishing with MD, husband at pt's side reporting he does not feel ready for patient to come home.  Lengthy discussion with pt and husband regarding dc planning and environmental modification recommendations including installation of grab bars in shower and on right side of toilet, lowering height of bed (currently 40 inches high).  Also discussed pts current functional status for dressing, bathing, toileting, and functional transfers.  Pts husband reports they bought larger tennis shoes; pt required total assist to donn AFO and shoes.  Stand pivot without AD recliner to w/c with min assist and step by step verbal and tactile cues for appropriate weight shifting and step direction (with therapist positioned in front of pt). Call bell in reach, seat alarm on.  Pillow placed under LUE to support shoulder.  Therapy Documentation Precautions:  Precautions Precautions: Fall Precaution Comments: L hemiplegia Restrictions Weight Bearing Restrictions: No LUE Weight Bearing: Weight bearing as tolerated LLE Weight Bearing: Weight bearing as tolerated    Therapy/Group: Individual Therapy  Ezekiel Slocumb 07/18/2021, 12:20 PM

## 2021-07-18 NOTE — Plan of Care (Signed)
  Problem: RH Expression Communication Goal: LTG Patient will increase speech intelligibility (SLP) Description: LTG: Patient will increase speech intelligibility at word/phrase/conversation level with cues, % of the time (SLP) Flowsheets (Taken 07/18/2021 0649) LTG: Patient will increase speech intelligibility (SLP): Supervision Level: (sentence) -- Percent of time patient will use intelligible speech: 80% Note: Goal downgraded due to slow progress   Problem: RH Problem Solving Goal: LTG Patient will demonstrate problem solving for (SLP) Description: LTG:  Patient will demonstrate problem solving for basic/complex daily situations with cues  (SLP) Flowsheets (Taken 07/18/2021 0649) LTG Patient will demonstrate problem solving for: Minimal Assistance - Patient > 75% Note: Goal downgraded due to slow progress   Problem: RH Memory Goal: LTG Patient will demonstrate ability for day to day (SLP) Description: LTG:   Patient will demonstrate ability for day to day recall/carryover during cognitive/linguistic activities with assist  (SLP) Flowsheets (Taken 07/18/2021 0649) LTG: Patient will demonstrate ability for day to day recall/carryover during cognitive/linguistic activities with assist (SLP): Supervision Note: Goal downgraded due to slow progress   Problem: RH Attention Goal: LTG Patient will demonstrate this level of attention during functional activites (SLP) Description: LTG:  Patient will will demonstrate this level of attention during functional activites (SLP) Flowsheets (Taken 07/18/2021 0649) LTG: Patient will demonstrate this level of attention during cognitive/linguistic activities with assistance of (SLP): Supervision Note: Goal downgraded due to slow progress

## 2021-07-18 NOTE — Patient Care Conference (Signed)
Inpatient RehabilitationTeam Conference and Plan of Care Update Date: 07/18/2021   Time: 13:09 PM    Patient Name: Erika [name]      Medical Record Number: [id number]  Date of Birth: 1971/03/23 Sex: Female         Room/Bed: 4W19C/4W19C-01 Payor Info: Payor: MEDICARE / Plan: MEDICARE PART A / Product Type: *No Product type* /    Admit Date/Time:  06/28/2021  1:00 PM  Primary Diagnosis:  Right pontine stroke [facility])  Hospital Problems: Principal Problem:   Right pontine stroke [facility])    Expected Discharge Date: Expected Discharge Date: 08/01/21  Team Members Present: Physician leading conference: Dr. Leeroy Cha Social Worker Present: Ovidio Kin, LCSW Nurse Present: Dorien Chihuahua, RN PT Present: Ginnie Smart, PT OT Present: Leretha Pol, OT SLP Present: Charolett Bumpers, SLP     Current Status/Progress Goal Weekly Team Focus  Bowel/Bladder             Swallow/Nutrition/ Hydration   Dys. 3 textures with thin liquids, Supervision  Mod I  Use of swallow strategies, trials of regular textures   ADL's             Mobility   modA bed mobility, minA squat<>pivot transfers, modA gait with RW ~61f  supervision to min assist  AFO consult on Tuesday (7/26), functional transfers, gait training, LLE NMR, DC planning, family education/training, pt education, safety awareness and falls risk education   Communication   Min-Mod A  Mod I  increased use of speech intelligibility strategies, self-monitoring and correcting errors   Safety/Cognition/ Behavioral Observations  Min-Mod A  Supervision A - Mod I  mildly complex problem solving, recall and attention   Pain             Skin               Discharge Planning:  Pt and husband asking for extension to meet her goals since ways to go still. Husband here daily and observes in therapies and provides emotional support   Team Discussion: CBGs stable. Depression treated. HTN at goal. Patient on target to meet rehab  goals: yes, currently min - mod assist for bed mobility and min assist for sit - stand transfers, mod assist for squat pivot transfers. Able to ambulate 48 with 1 person assist and 335' with 2 people and mod assist. Min - mod assist for SLP and cognition.   *See Care Plan and progress notes for long and short-term goals.   Revisions to Treatment Plan:  AFO consult   Extended stay a week due to progress and medical issues upon admission Working on error awareness, mild complex problem solving and recall Trial regular consistency diet  Teaching Needs: Transfers, toileting, medications, secondary stroke risk management, etc.  Current Barriers to Discharge: Decreased caregiver support and Home enviroment access/layout  Possible Resolutions to Barriers: Family education Ramp for entry at discharge DME: DA BSC and shower bench     Medical Summary Current Status: CBGs stable, anxiety, depression, hypertension  Barriers to Discharge: Medical stability;Decreased family/caregiver support;Behavior  Barriers to Discharge Comments: anxiety, depression, type 2 DM, hypertension Possible Resolutions to [employer]Focus: scheduled her klonopin at bedtime, continue Lantus 22U and CBG checks AC/HS, continue BP checks and current anti-hypertensives   Continued Need for Acute Rehabilitation Level of Care: The patient requires daily medical management by a physician with specialized training in physical medicine and rehabilitation for the following reasons: Direction of a multidisciplinary physical rehabilitation program to maximize  functional independence : Yes Medical management of patient stability for increased activity during participation in an intensive rehabilitation regime.: Yes Analysis of laboratory values and/or radiology reports with any subsequent need for medication adjustment and/or medical intervention. : Yes   I attest that I was present, lead the team conference, and concur with  the assessment and plan of the team.   Dorien Chihuahua B 07/18/2021, 3:52 PM

## 2021-07-18 NOTE — Progress Notes (Signed)
Occupational Therapy Session Note  Patient Details  Name: Erika [name] MRN: [id number] Date of Birth: 08-26-1971  Today's Date: 07/18/2021 OT Individual Time: 2919-1660 OT Individual Time Calculation (min): 45 min    Short Term Goals: Week 3:  OT Short Term Goal 1 (Week 3): STGs=LTGs due to ELOS  Skilled Therapeutic Interventions/Progress Updates:    Pt seen this session in the day room to focus on LE strength with isometric squat holds, sit to stand, swivels for shifting hips to R and L.  Much improved today with leg and trunk control with the ability to complete these activities with only light CGA. LUE NMR with table slides, ball rolls, and UE ranger. Trace scapula/sh flex and extension.  Excellent participation.  Pt returned to room with belt alarm on and all needs met.  Therapy Documentation Precautions:  Precautions Precautions: Fall Precaution Comments: L hemiplegia Restrictions Weight Bearing Restrictions: No LUE Weight Bearing: Weight bearing as tolerated LLE Weight Bearing: Weight bearing as tolerated    Vital Signs: Therapy Vitals Temp: 97.9 F (36.6 C) Temp Source: Oral Pulse Rate: 86 Resp: 19 BP: (!) 166/86 Patient Position (if appropriate): Lying Oxygen Therapy SpO2: 94 % O2 Device: Room Air Pain: Pain Assessment Pain Scale: 0-10 Pain Score: 8  Pain Type: Acute pain Pain Location: Head Pain Descriptors / Indicators: Headache Pain Intervention(s): Medication (See eMAR) ADL: ADL Eating: Set up Grooming: Setup Upper Body Bathing: Minimal assistance Lower Body Bathing: Moderate assistance Upper Body Dressing: Moderate assistance Lower Body Dressing: Maximal assistance Toileting: Maximal assistance Where Assessed-Toileting: Bedside Commode Toilet Transfer: Moderate assistance Toilet Transfer Method: Squat pivot Toilet Transfer Equipment: Drop arm bedside commode   Therapy/Group: Individual Therapy  [place] 07/18/2021, 8:30 AM

## 2021-07-18 NOTE — Progress Notes (Signed)
Patient ID: Erika [name], female   DOB: Nov 30, 1971, 50 y.o.   MRN: [id number] Met with pt and husband to discuss team conference progress in therapies and discharge extension to 8/9 due to progress in therapies. Husband was surprised to hear she needs 24/7 care this was conveyed after first conference. Offered to have their daughter come in to go through therapies with pt and se eif she is able to care for her for when husband is working. He will try to work from home but may have times he can't. Both in agreement with this plan and husband continues to work on temporary ramp for home.

## 2021-07-18 NOTE — Progress Notes (Signed)
Physical Therapy Session Note  Patient Details  Name: Erika [name] MRN: [id number] Date of Birth: [birth year]  Today's Date: 07/18/2021 PT Individual Time: 1330-1430 PT Individual Time Calculation (min): 60 min   Short Term Goals: Week 3:  PT Short Term Goal 1 (Week 3): Pt will complete bed mobility with minA PT Short Term Goal 2 (Week 3): Pt will complete bed<>chair transfers with minA and LRAD PT Short Term Goal 3 (Week 3): Pt will ambulate 69f with modA of 1 person and LRAD PT Short Term Goal 4 (Week 3): Husband will participate in hands on training PT Short Term Goal 5 (Week 3): Pt will initiate stair training  Skilled Therapeutic Interventions/Progress Updates:      Pt greeted seated in w/c and agreeable to PT session. Denies pain. Husband at bedside and pt/family updated on extending pt's stay for 1 week in order to optimize functional mobility, safety, caregiver training, and DC planning. Pt and family pleased. Focus of session to complete AFO assessment with CGerald St[facility]CSan [facility] was present for part of session. Pt wheeled to day room rehab gym in w/c for time. Pt had off the shelf AFO for L foot. Sit<>stand to RW with minA and assist needed for positioning LUE in RW hand splint. She ambulated ~274fwith modA and RW with assist primarily for RW management and balance as she's able to lift and clear LLE without assist but struggles with L foot placement. She also lacks hip/knee flexion and compensates with hip hiking. Determined that since pt is receiving an extension with her stay, that AFO needs be determined next week, closer to DC. Pt then wheeled to main rehab gym where she completed squat<>pivot transfer with minA from w/c to mat table. Worked on strengthening LLE for stance phase control by completing RLE step-ups to 2inch block and then progressing to 6 inch - had RUE support to back of arm chair and therapist supporting/blocking LLE to prevent knee buckling.  Next, worked on lateral stepping L<>R along length of mat table. She required modA for balance while doing this and had several LOB posteriorly as well as L knee buckling. Pt completed squat<>pivot transfer back to her w/c with minA and assisted back to her room. Discussed with husband regarding bed height, ramp for home entrance, DC planning, etc - all questions answered and addressed. Handoff of care to SLP at end of session with pt sitting in w/c and all needs met.   Therapy Documentation Precautions:  Precautions Precautions: Fall Precaution Comments: L hemiplegia Restrictions Weight Bearing Restrictions: No LUE Weight Bearing: Weight bearing as tolerated LLE Weight Bearing: Weight bearing as tolerated General:    Therapy/Group: Individual Therapy  ChAlger [name]/26/2022, 7:46 AM

## 2021-07-19 LAB — GLUCOSE, CAPILLARY
Glucose-Capillary: 125 mg/dL — ABNORMAL HIGH (ref 70–99)
Glucose-Capillary: 215 mg/dL — ABNORMAL HIGH (ref 70–99)
Glucose-Capillary: 120 mg/dL — ABNORMAL HIGH (ref 70–99)
Glucose-Capillary: 262 mg/dL — ABNORMAL HIGH (ref 70–99)

## 2021-07-19 NOTE — Progress Notes (Addendum)
PROGRESS NOTE   Subjective/Complaints:  No new issues, mother in law at bedside ROS: Patient denies CP, SOB, N/V/D,   Objective:   No results found. Recent Labs    07/17/21 0329  WBC 10.8*  HGB 12.9  HCT 40.6  PLT 281    Recent Labs    07/17/21 0329  NA 137  K 3.7  CL 96*  CO2 32  GLUCOSE 98  BUN 13  CREATININE 0.87  CALCIUM 10.2     Intake/Output Summary (Last 24 hours) at 07/19/2021 [id number] Last data filed at 07/18/2021 1816 Gross per 24 hour  Intake 360 ml  Output --  Net 360 ml          Physical Exam: Vital Signs Blood pressure (!) 146/87, pulse 88, temperature 97.8 F (36.6 C), temperature source Oral, resp. rate 18, height '5\' 6"'$  (1.676 m), weight 57.6 kg, SpO2 95 %.  General: No acute distress Mood and affect are appropriate Heart: Regular rate and rhythm no rubs murmurs or extra sounds Lungs: Clear to auscultation, breathing unlabored, no rales or wheezes Abdomen: Positive bowel sounds, soft nontender to palpation, nondistended Extremities: No clubbing, cyanosis, or edema Skin: No evidence of breakdown, no evidence of rash  Neurological:    Mental Status: She is alert and oriented to person, place, and time.    Comments: Left facial weakness with mild dysarthria. Able to answer orientation questions and follow commands without difficulty.  0/5 LUE and trace-1/5 prox to 0/5 distally LLE 4/5 on Right side    Assessment/Plan: 1. Functional deficits which require 3+ hours per day of interdisciplinary therapy in a comprehensive inpatient rehab setting. Physiatrist is providing close team supervision and 24 hour management of active medical problems listed below. Physiatrist and rehab team continue to assess barriers to discharge/monitor patient progress toward functional and medical goals  Care Tool:  Bathing    Body parts bathed by patient: Abdomen, Chest, Left arm, Right arm, Front  perineal area, Right upper leg, Left upper leg, Right lower leg, Left lower leg, Face, Buttocks   Body parts bathed by helper: Right arm, Buttocks, Right lower leg, Left lower leg     Bathing assist Assist Level: Contact Guard/Touching assist     Upper Body Dressing/Undressing Upper body dressing   What is the patient wearing?: Pull over shirt    Upper body assist Assist Level: Minimal Assistance - Patient > 75%    Lower Body Dressing/Undressing Lower body dressing      What is the patient wearing?: Pants     Lower body assist Assist for lower body dressing: Minimal Assistance - Patient > 75%     Toileting Toileting    Toileting assist Assist for toileting: Minimal Assistance - Patient > 75%     Transfers Chair/bed transfer  Transfers assist     Chair/bed transfer assist level: Moderate Assistance - Patient 50 - 74%     Locomotion Ambulation   Ambulation assist      Assist level: 2 helpers Assistive device: Hand held assist Max distance: 35   Walk 10 feet activity   Assist     Assist level: 2 helpers Assistive device: Hand held  assist   Walk 50 feet activity   Assist Walk 50 feet with 2 turns activity did not occur: Safety/medical concerns  Assist level: Dependent - Patient 0% Assistive device: Lite Gait    Walk 150 feet activity   Assist Walk 150 feet activity did not occur: Safety/medical concerns         Walk 10 feet on uneven surface  activity   Assist Walk 10 feet on uneven surfaces activity did not occur: Safety/medical concerns         Wheelchair     Assist Will patient use wheelchair at discharge?: No Type of Wheelchair: Manual    Wheelchair assist level: Supervision/Verbal cueing Max wheelchair distance: 165f    Wheelchair 50 feet with 2 turns activity    Assist        Assist Level: Supervision/Verbal cueing   Wheelchair 150 feet activity     Assist          Blood pressure (!) 146/87,  pulse 88, temperature 97.8 F (36.6 C), temperature source Oral, resp. rate 18, height '5\' 6"'$  (1.676 m), weight 57.6 kg, SpO2 95 %.    Medical Problem List and Plan: 1.  Right pontine stroke             -patient may shower             -ELOS: 8/9 extended due to upgrade of goals to supervision so elderly caregiver can assist    -Continue CIR therapies including PT, OT, and SLP - Estim patch to finger and wrist extensors  -grounds pass prn  -Interdisciplinary Team Conference today   2.  Impaired mobility: Continue Lovenox             -antiplatelet therapy: Continue ASA/Plavix 3. Cluster headaches and migraines: continue abortive oxygen PRN. Oxycodone prn for HA. Discussed outpatient Botox- patient would like to try. D/c periactin and phenergan as patient not using 4. Mood: Team to provide ego support. LCSW to follow for evaluation and support.             -antipsychotic agents: Lamictal daily.  -asked pt to let [place]now if she needs help with her mood/coping.  5. Neuropsych: This patient is capable of making decisions on her own behalf. 6. Skin/Wound Care: Routine pressure relief measures. 7. Fluids/Electrolytes/Nutrition: Monitor I/O. Check lytes in am. 8. HTN: SBP goal 140-180 range to avoid hypoperfusion of brain. Per husband "acts drunk when BP is low" --At goal, continue Imdur, Coreg, aldactone, Demadex,  Vitals:   07/19/21 0528 07/19/21 0816  BP: 127/68 (!) 146/87  Pulse: 93 88  Resp: 18 18  Temp: 98.2 F (36.8 C) 97.8 F (36.6 C)  SpO2: 93% 95%  Decrease coreg to 3.'125mg'$  daily 9. T2DM: Hgb A1C- 10.7. BS run 200's at home. Dietary education/reinforcement.             --Continue Lantus  45 units BID with SSI for elevated BS             Continue decreased Lantus to 22U BID CBG (last 3)  Recent Labs    07/18/21 1708 07/18/21 2024 07/19/21 0623  GLUCAP 145* 292* 125*    CBG labile monitor for pattern 10. CAD s/p CABG: On ASA, coreg, Cozaar, Demadex, ASA, 11. PAD  w/claudication: Has been smoking 1 PPD --quit at admission. Continue Nicotine patch. --On lyrica BID to manage symptoms.   12. Aspiration PNA: CT reviewed, appreciate medicine consult,  Resolved off abx may d/c IV 13.  Chronic cluster headaches, currently well controlled: Managed with thorazine as well as oxycodone and high flow oxygen prn. Decreased oxycodone to TID PRN and edited order to indicate it should be given after therapy as per husband's request- makes her too sedated to tolerate therapy. 7/16- added Periactin prn for associated Sx's-con't oxy and O2.     14. H/o depression/anxiety: Continue Lamictal, Prozac with Klonopin prn. 15. Chronic insomnia: Managed with home dose trazodone. 16. Dyslipidemia: Trig- 636, direct LDL-99.6, HDL 29, Chol 202. On Zetia--to start Vespc 17. Vitamin B 12 deficiency: B12- 169 (was WNL a year ago) 18. NASH/Fatty liver: LFTs reviewed and normalized 19. Dense left sided hemiplegia: [place] regarding NMES- started trials Friday. They have TENS unit at home.  20. Hypokalemia: supplement 37mq 7/7 and repeat BMP reviewed, K+ improved to 3.8. Resolved. D/c daily supplement.  21. Leukocytosis: trending upward potentially secondary to aspiration pneumonia  7/18 still some elevation today   -afebrile, no s/s on exam   -monitor serially   -hospitalist following 22. Vitamin D deficiency: continue ergocalciferol 50,000U weekly for 7 weeks.  23.  Hypoxia ,resolved  24. AKI: resolved  25. Poor perfusion of LLE  7/17- per IM, LLE is colder than RLE_ by a lot- and no DP pulse palpable- Vascular said con't current regimen and monitor closely for low perfusion injury. 7/18 leg feels warmer (by comparative accounts). Normal appearing   -recent ABI 0.46   26. Left post-stroke shoulder pain: d/c lidocaine patch and ordered voltaren gel. Apply kinesiology tape 27. S/p fall- lac to left knee, Head CT negative- reviewed with patient and husband. Please use Stedy for  all transfers.  28. Hypokalemia: supplement 218m on 7/25, monitor K+ weekly.  29. Disposition: Lives with husband and daughter (autistic but high functioning)- husband very involved and has already coordinated with his boss to take time off to care for her. Messaged April to schedule HFU/TC.    LOS: 21 days A FACE TO FACE EVALUATION WAS PERFORMED  AnCharlett Blake/27/2022, 9:22 AM

## 2021-07-19 NOTE — Progress Notes (Signed)
Speech Language Pathology Daily Session Note  Patient Details  Name: KENNEDY LEBECK MRN: [id number] Date of Birth: 1971/04/10  Today's Date: 07/19/2021 SLP Individual Time: 1000-1044 SLP Individual Time Calculation (min): 44 min  Short Term Goals: Week 3: SLP Short Term Goal 1 (Week 3): Patient will tolerate current diet of Dys 3 solids, thin liquids with supervision A level for following swallow safety precautions. SLP Short Term Goal 2 (Week 3): Patient will tolerate trials of regular texture solids with supervision to minA for clearing oral residuals. SLP Short Term Goal 3 (Week 3): Patient will perform mildly complex tasks in minimally distracting environment at supervision A level. SLP Short Term Goal 4 (Week 3): Patient will demonstrate awareness to errors and self correct errors with minA cues during functional tasks. SLP Short Term Goal 5 (Week 3): Patient will demonstrate anticipatory awareness abilities during simulated functional tasks with supervision A. SLP Short Term Goal 6 (Week 3): Patient will be able to direct her care during transfers with minA cues to initiate.  Skilled Therapeutic Interventions:Skilled ST services focused on cognitive skills. SLP facilitated mildly complex problem solving and error awareness skills in 3 organizations paper tasks (organizing kitchen, closet and garden.) Pt required min-supervision A verbal cues due to fast rate for reading and reduced error awareness. SLP started scheduling task, pt was able to highlight main events/times with supervision A verbal cues and will complete task in upcoming sessions.  Pt was left in room with call bell within reach and chair alarm set. SLP recommends to continue skilled services.     Pain Pain Assessment Pain Score: 0-No pain  Therapy/Group: Individual Therapy  Johanan Skorupski  [facility] 07/19/2021, 4:51 PM

## 2021-07-19 NOTE — Progress Notes (Signed)
Physical Therapy Session Note  Patient Details  Name: Erika [name] MRN: [id number] Date of Birth: [birth year]  Today's Date: 07/19/2021 PT Individual Time: 1300-1415 PT Individual Time Calculation (min): 75 min   Short Term Goals: Week 3:  PT Short Term Goal 1 (Week 3): Pt will complete bed mobility with minA PT Short Term Goal 2 (Week 3): Pt will complete bed<>chair transfers with minA and LRAD PT Short Term Goal 3 (Week 3): Pt will ambulate 69f with modA of 1 person and LRAD PT Short Term Goal 4 (Week 3): Husband will participate in hands on training PT Short Term Goal 5 (Week 3): Pt will initiate stair training   Skilled Therapeutic Interventions/Progress Updates:    Pt greeted seated in recliner to start session and agreeable to therapy. No reports of pain. Donned socks, shoes, and L AFO with totalA - increased difficulty sliding L heel into shoe and may benefit from shoe horn to improve ease and efficiency. Completed squat<>pivot transfer with modA towards w/c from recliner. Pt worked on self propelling herself in w/c, which she propelled herself ~1640fwith supervision by using hemi-technique with R hemibody. Propulsion is very slow with poor stroke efficiency. May benefit from a lower sitting w/c to improve R heel strike during propulsion. Next, focused majority of session on functional transfers with the car. She completed a total of 2 car transfers, initially requiring maxA and the 2nd time required modA. Completed via squat<>pivot technique and required totalA for LLE management into and out of the car. Pt then wheeled to main rehab gym for time. Completed squat<>Pivot transfer with minA to mat table and then sit>supine with modA for trunk and LLE management. Worked on supine and sidelying there-ex: -1x10 bridges (totalA for LLE placement)  -1x10 bridges with LLE bias  Used powder board for sidlelying there-ex: -1x15 knee flex/ext -1x15 hip flex/ext *Pt with increased  hip/knee flexor weakness. Knee flex strength 2-/5. Hip flex strength 2-/5. Unable to complete full ROM in gravity eliminated position. May benefit from e-stim to assist with muscle facilitation and NMR.  Completed supine<>sit with modA and then squat<>pivot back to her w/c with minA. She was returned to her room in w/c where she requested to return to bed. Squat<>pivot transfer with minA back to bed and required modA for sit>Supine for trunk and BLE management. Supine scoot towards HOB with totalA with HOB in trendelenburg position. HOB raised and pt made comfortable with pillow supporting LUE. All needs met with bed alarm on.   Therapy Documentation Precautions:  Precautions Precautions: Fall Precaution Comments: L hemiplegia Restrictions Weight Bearing Restrictions: No LUE Weight Bearing: Weight bearing as tolerated LLE Weight Bearing: Weight bearing as tolerated General:    Therapy/Group: Individual Therapy  Erika [name] 07/19/2021, 3:24 PM

## 2021-07-19 NOTE — Progress Notes (Signed)
Occupational Therapy Session Note  Patient Details  Name: Erika [name] MRN: [id number] Date of Birth: [birth year]  Today's Date: 07/19/2021 OT Individual Time: 6967-8938 (930-1030am unattended estim) OT Individual Time Calculation (min): 60 min    Short Term Goals: Week 1:  OT Short Term Goal 1 (Week 1): Pt will be able to complete toilet transfer squat pivot with min A. OT Short Term Goal 1 - Progress (Week 1): Met OT Short Term Goal 2 (Week 1): Pt will be able to stand with min A and pull pants over hips with min A. OT Short Term Goal 2 - Progress (Week 1): Not met OT Short Term Goal 3 (Week 1): Pt will be able to don shirt with min A. OT Short Term Goal 3 - Progress (Week 1): Met OT Short Term Goal 4 (Week 1): Pt will be able to self mobilize LUE. OT Short Term Goal 4 - Progress (Week 1): Met Week 2:  OT Short Term Goal 1 (Week 2): Pt will be able to stand with min A and pull pants over hips with min A. OT Short Term Goal 1 - Progress (Week 2): Met OT Short Term Goal 2 (Week 2): Pt will complete toilet transfers with stand pivot min assist. OT Short Term Goal 2 - Progress (Week 2): Met OT Short Term Goal 3 (Week 2): Pt will donn a pullover shirt with supervision following hemi techniques. OT Short Term Goal 3 - Progress (Week 2): Progressing toward goal Week 3:  OT Short Term Goal 1 (Week 3): STGs=LTGs due to ELOS  Skilled Therapeutic Interventions/Progress Updates:      Pt seen for BADL retraining of toileting, bathing, and dressing with a focus on balance, postural control, memory for task sequencing.  Pt's mother in law present and began some education with her. Pt participated well. See ADL documentation below.  Pt shower this morning and used squat pivots to shower and toilet.  Saebo stim one place on forearm for unattended cyclic estim for wrist and finger extension.  Pt tolerated the full 60 minutes well with no adverse affects.   Saebo Stim One 330 pulse width 35 Hz  pulse rate On 8 sec/ off 8 sec Ramp up/ down 2 sec Symmetrical Biphasic wave form  Max intensity 137m at 500 Ohm load   Pt resting in recliner with alarm on and all needs met.  Therapy Documentation Precautions:  Precautions Precautions: Fall Precaution Comments: L hemiplegia Restrictions Weight Bearing Restrictions: No LUE Weight Bearing: Weight bearing as tolerated LLE Weight Bearing: Weight bearing as tolerated    Vital Signs: Therapy Vitals Temp: 97.8 F (36.6 C) Temp Source: Oral Pulse Rate: 88 Resp: 18 BP: (!) 146/87 Patient Position (if appropriate): Sitting Oxygen Therapy SpO2: 95 % O2 Device: Room Air Pain: No c/o pain during OT session  ADL: ADL Eating: Set up Grooming: Setup Upper Body Bathing: Contact guard Where Assessed-Upper Body Bathing: Shower Lower Body Bathing: Minimal assistance Where Assessed-Lower Body Bathing: Shower Upper Body Dressing: Minimal assistance Lower Body Dressing: Moderate assistance Toileting: Moderate assistance Where Assessed-Toileting: Bedside Commode Toilet Transfer: Minimal assistance Toilet Transfer Method: Squat pivot Toilet Transfer Equipment: Drop arm bedside commode WSocial research officer, government Minimal assistance WSocial research officer, governmentMethod: SEducation officer, environmental TRadio broadcast assistant Grab bars   Therapy/Group: Individual Therapy  [place]7/27/2022, 10:45 AM

## 2021-07-20 LAB — GLUCOSE, CAPILLARY
Glucose-Capillary: 126 mg/dL — ABNORMAL HIGH (ref 70–99)
Glucose-Capillary: 205 mg/dL — ABNORMAL HIGH (ref 70–99)
Glucose-Capillary: 208 mg/dL — ABNORMAL HIGH (ref 70–99)
Glucose-Capillary: 150 mg/dL — ABNORMAL HIGH (ref 70–99)

## 2021-07-20 NOTE — Progress Notes (Signed)
Secure chat sent to [facility], about midline removal.

## 2021-07-20 NOTE — Progress Notes (Signed)
Physical Therapy Session Note  Patient Details  Name: Erika [name] MRN: [id number] Date of Birth: 26-Sep-1971  Today's Date: 07/20/2021 PT Individual Time: 0932-6712 PT Individual Time Calculation (min): 50 min   Short Term Goals: Week 1:  PT Short Term Goal 1 (Week 1): Pt will perform bed mobility with minA. PT Short Term Goal 1 - Progress (Week 1): Met PT Short Term Goal 2 (Week 1): Pt will perform sit to stand transfer with minA. PT Short Term Goal 2 - Progress (Week 1): Met PT Short Term Goal 3 (Week 1): Pt perform bed to chair transfer with minA. PT Short Term Goal 3 - Progress (Week 1): Not met PT Short Term Goal 4 (Week 1): Pt will ambulate x25' with modA. PT Short Term Goal 4 - Progress (Week 1): Not met Week 2:  PT Short Term Goal 1 (Week 2): Pt will complete bed mobility with CGA PT Short Term Goal 1 - Progress (Week 2): Not met PT Short Term Goal 2 (Week 2): Pt will complete bed<>chair transfers with minA PT Short Term Goal 2 - Progress (Week 2): Not met PT Short Term Goal 3 (Week 2): Pt will ambulate 59ft with modA of 1 person and LRAD PT Short Term Goal 3 - Progress (Week 2): Not met Week 3:  PT Short Term Goal 1 (Week 3): Pt will complete bed mobility with minA PT Short Term Goal 1 - Progress (Week 3): Not met (requires modA) PT Short Term Goal 2 (Week 3): Pt will complete bed<>chair transfers with minA and LRAD PT Short Term Goal 2 - Progress (Week 3): Partly met (performance fluctuates daily b/w minA to modA) PT Short Term Goal 3 (Week 3): Pt will ambulate 53ft with modA of 1 person and LRAD PT Short Term Goal 3 - Progress (Week 3): Met PT Short Term Goal 4 (Week 3): Husband will participate in hands on training PT Short Term Goal 4 - Progress (Week 3): Partly met (Needs further training) PT Short Term Goal 5 (Week 3): Pt will initiate stair training PT Short Term Goal 5 - Progress (Week 3): Not met (deferred due to safety concerns. Husband will be getting a  ramp to enter home)  Skilled Therapeutic Interventions/Progress Updates:    PAIN Pt initially on commode.  Sit to stand in stedy w/min assist.  Dependent for clothing management, stands w/cga in stedy.  Transported to chair. Transported in wc to gym  Standing in parallel bars: Using LLE to tap colored discs to work on controlled placement of limb - impaired accuracy but can direct limb in multidirectional manner primarily using hip. Side stepping length of bars w/mirror for feedback, cues to activate and sustain quad activation to prevent buckling.  One episode of buckling initially w/attempt to turn to face bars, therapist controls pt to wc without incident.   When stepping to L pt w/ER of hip, difficulty correcting.  Tapping 1in stool w/therapist facilitating hip flexor and hamsring activation for controlled stepping/limiting circumduction tendency.  Pt w/much difficulty isolating hamstring activation.  In sitting - attempted heel digs and rolling bolster on floor w/L foot but pt unable to isolate hip flexor or hs in this position.  Gait: 13ft using hallway rail, AFO w/cga to light min assist, cues for step thru gait on R, cues for foot placement away from midline LLE due to narrow base tendency, cues to attend to quad activation/sustained activation thru stance, mild tendency for extension thrust at loading.  Pt w/good  posture and advancement of L hip over foot/knee at midstance.  Gaitbelt used to support L hand/UE during gait.   Wc propulsion using hemitechnique x 14ft inclufding turning 180* performed for functional mobility activity.  At end of session, pt handed off to husband in room who was planning to take pt outdoors in wc.   Therapy Documentation Precautions:  Precautions Precautions: Fall Precaution Comments: L hemiplegia Restrictions Weight Bearing Restrictions: No LUE Weight Bearing: Weight bearing as tolerated LLE Weight Bearing: Weight bearing as tolerated        Therapy/Group: Individual Therapy Callie Fielding, [place] 07/20/2021, 3:11 PM

## 2021-07-20 NOTE — Progress Notes (Signed)
Speech Language Pathology Weekly Progress and Session Note  Patient Details  Name: Erika [name] MRN: [id number] Date of Birth: 1971/04/07  Beginning of progress report period: 07/13/2021 End of progress report period: 07/20/2021  Today's Date: 07/20/2021 SLP Individual Time: 1305-1400 SLP Individual Time Calculation (min): 55 min  Short Term Goals: Week 3: SLP Short Term Goal 1 (Week 3): Patient will tolerate current diet of Dys 3 solids, thin liquids with supervision A level for following swallow safety precautions. SLP Short Term Goal 1 - Progress (Week 3): Met SLP Short Term Goal 2 (Week 3): Patient will tolerate trials of regular texture solids with supervision to minA for clearing oral residuals. SLP Short Term Goal 2 - Progress (Week 3): Met SLP Short Term Goal 3 (Week 3): Patient will perform mildly complex tasks in minimally distracting environment at supervision A level. SLP Short Term Goal 3 - Progress (Week 3): Progressing toward goal SLP Short Term Goal 4 (Week 3): Patient will demonstrate awareness to errors and self correct errors with minA cues during functional tasks. SLP Short Term Goal 4 - Progress (Week 3): Progressing toward goal SLP Short Term Goal 5 (Week 3): Patient will demonstrate anticipatory awareness abilities during simulated functional tasks with supervision A. SLP Short Term Goal 5 - Progress (Week 3): Met SLP Short Term Goal 6 (Week 3): Patient will be able to direct her care during transfers with minA cues to initiate. SLP Short Term Goal 6 - Progress (Week 3): Met    New Short Term Goals: Week 4: SLP Short Term Goal 1 (Week 4): Patient will perform mildly complex problem solving and reasoning tasks with minA cues for accuracy. SLP Short Term Goal 2 (Week 4): Patient will demonstrate alternating attention during basic to mildly complex functional tasks at supervision to minA level. SLP Short Term Goal 3 (Week 4): Patient will tolerate regular  texture solids, thin liquids during meals with intermittent A for swallow safety. SLP Short Term Goal 4 (Week 4): Patient will demonstrate anticipatory awareness during functional tasks and planning for future discharge home, with minA.  Weekly Progress Updates:  Patient has made good progress, meeting 4/6 STG's and progressing towards the two goals she did not meet. She continues to benefit from supervision and cues to complete mildly complex problem solving tasks, with her difficulty with attention being the primary factor. She was recently upgraded to regular texture solids and changed from full to intermittent supervision during PO intake.   Intensity: Minumum of 1-2 x/day, 30 to 90 minutes Frequency: 3 to 5 out of 7 days Duration/Length of Stay: 08/01/21 Treatment/Interventions: Cognitive remediation/compensation;Dysphagia/aspiration precaution training;Medication managment;Patient/family education;Functional tasks;Speech/Language facilitation;Internal/external aids;Cueing hierarchy;Environmental controls;Therapeutic Activities   Daily Session  Skilled Therapeutic Interventions: Patient seen for skilled ST session with focus on cognitive function goals. Husband present during last 15-20 minutes of session and SLP discussed upgraded her diet consistency, changing from full supervision to intermittent. He also asked about things they could do when patient discharges home and SLP recommended some simulated tasks involving banking, checkbook register and alternating attention tasks. Patient completed mild complex level check register task, requiring initially modA cues for alternating attention and noticing errors, but this improved to min-modA and patient exhibiting some awareness to errors and self-correcting. SLP noted that patient entering numbers into calculator too quickly, leading to many errors. She continues to benefit from skilled SLP intervention to maximize cognitive and swallow function  prior to discharge.     General    Pain Pain  Assessment Pain Scale: 0-10 Pain Score: 0-No pain  Therapy/Group: Individual Therapy  Sonia Baller, MA, CCC-SLP Speech Therapy

## 2021-07-20 NOTE — Progress Notes (Signed)
Physical Therapy Weekly Progress Note  Patient Details  Name: Erika [name] MRN: [id number] Date of Birth: December 31, 1970  Beginning of progress report period: July 13, 2021 End of progress report period: July 20, 2021  Today's Date: 07/20/2021 PT Individual Time: 0830-0930 PT Individual Time Calculation (min): 60 min   Patient has met 1 of 5 short term goals and partially met 2 of 5 short term goals. Pt continues to make progress towards long term goals. She requires modA for bed mobility, min to modA (depending on fatigue, attention, and awareness) for squat<>pivot transfers, minA for sit<>stand transfers, and can ambulate ~84f with modA of 1 person (often requires +2 assist for w/c follow for safety concerns). Stair training has not been initiated due to safety concerns - husband will be providing temporary ramp to enter home at discharge. Family training as been initiated, primarily with education vs hands-on training. Will continue to work on caregiver education.  Patient continues to demonstrate the following deficits muscle weakness, decreased cardiorespiratoy endurance, unbalanced muscle activation, motor apraxia, decreased coordination, and decreased motor planning, decreased attention to left and decreased motor planning, decreased attention, decreased awareness, decreased problem solving, and decreased safety awareness, and decreased sitting balance, decreased standing balance, decreased postural control, hemiplegia, and decreased balance strategies and therefore will continue to benefit from skilled PT intervention to increase functional independence with mobility.  Patient progressing toward long term goals..  Continue plan of care.  PT Short Term Goals Week 3:  PT Short Term Goal 1 (Week 3): Pt will complete bed mobility with minA PT Short Term Goal 1 - Progress (Week 3): Not met (requires modA) PT Short Term Goal 2 (Week 3): Pt will complete bed<>chair transfers with minA and  LRAD PT Short Term Goal 2 - Progress (Week 3): Partly met (performance fluctuates daily b/w minA to modA) PT Short Term Goal 3 (Week 3): Pt will ambulate 121fwith modA of 1 person and LRAD PT Short Term Goal 3 - Progress (Week 3): Met PT Short Term Goal 4 (Week 3): Husband will participate in hands on training PT Short Term Goal 4 - Progress (Week 3): Partly met (Needs further training) PT Short Term Goal 5 (Week 3): Pt will initiate stair training PT Short Term Goal 5 - Progress (Week 3): Not met (deferred due to safety concerns. Husband will be getting a ramp to enter home) Week 4:  PT Short Term Goal 1 (Week 4): Family training/education will be incorporated in therapy sessions to prepare for discharge and reduce caregiver burden PT Short Term Goal 2 (Week 4): Pt will complete sit<>stand transfers with CGA and LRAD PT Short Term Goal 3 (Week 4): Pt will consistently complete squat<>pivot transfers with minA and LRAD PT Short Term Goal 4 (Week 4): Pt will ambulate 5075fith minA and LRAD  Skilled Therapeutic Interventions/Progress Updates:     Pt greeted seated in recliner to start session with husband at bedside. Pt denies any pain and is agreeable to therapy. Focus of session to continue family education and hands on family/caregiver training with husband agreeable. Pt completed squat<>pivot transfer from recliner to w/c with husband assisting and PT providing CGA for safety - PT needing to assist/cues with positioning and setup - husband needing cues for body mechanics/positioning due to rounded back and lack of proximity to pt.   Pt then wheeled with totalA for time to ortho rehab gym in her w/c. From there, focused majority of session on blocked practice squat<>pivot transfers to/from  mat table and wheelchair. Husband performed 10 repetitions total with PT providing CGA/close supervision for safety and also guiding discussion and feedback on technique and positioning. At times, pt feels that  husband is doing "too much" lifting and this was helpful. Husband requesting PT provide example for squat<>pivot transfers and this was done with min/modA from w/c to mat table. She needs cues for forward weight shift, head/hip ratio, and totalA for L foot placement. Husband prefer's over-the-back technique for squat<>pivot transfer and pt in agreement.   Next, focused on car transfers. Pt completed 1st car transfer with PT, requiring min/modA for squat<>pivot transfer and totalA for LLE management into and out of the car. Instructed to use "grab bar" in car to assist with positioning and balance for safety. Husband then completed car transfer with PT providing CGA for safety - again using over-the-back technique and PT guiding for setup and monitoring L foot placement to reduce risk of twisting ankle.   Husband and pt would benefit from further hands-on training for transfer training. Instructed them to bring their own car Information systems manager [employer]) to Network engineer. Will follow up with this at a later date.  She was wheeled back to her room at end of session and remained seated in w/c with LUE supported in arm trough. Husband remained at bedside. All needs met and awaiting upcoming TR session.   Therapy Documentation Precautions:  Precautions Precautions: Fall Precaution Comments: L hemiplegia Restrictions Weight Bearing Restrictions: No LUE Weight Bearing: Weight bearing as tolerated LLE Weight Bearing: Weight bearing as tolerated General:    Therapy/Group: Individual Therapy  [name] 07/20/2021, 7:42 AM

## 2021-07-20 NOTE — Evaluation (Signed)
Recreational Therapy Assessment and Plan  Patient Details  Name: Erika [name] MRN: [id number] Date of Birth: March 11, 1971 Today's Date: 07/20/2021  Rehab Potential:  Good ELOS:   d/c  8/2  Assessment Hospital Problem: Principal Problem:   Right pontine stroke [facility])     Past Medical History:      Past Medical History:  Diagnosis Date   Arthralgia of temporomandibular joint     CAD, multiple vessel      a. 06/2016 Cath: ostLM 40%, ostLAD 40%, pLAD 95%, ost-pLCx 60%, pLCx 95%, mLCx 60%, mRCA 95%, D2 50%, LVSF nl;  b. 07/2016 CABG x 4 (LIMA->LAD, VG->Diag, VG->OM, VG->RCA); c. 08/2016 Cath: 3VD w/ 4/4 patent grafts. LAD distal to LIMA has diff dzs->Med rx; d. 08/2020 Cath: 4/4 patent grafts, native 3VD. EF 55-65%-->Med Rx.   Carotid arterial disease ([place])      a. 07/2016 s/p R CEA; b. 02/2021 U/S: RICA 29-79%, LICA 8-92%.   Clotting disorder ([place])     Depression     Diastolic dysfunction      a. 06/2016 Echo: EF 50-55%, mild inf wall HK, GR1DD, mild MR, RV sys fxn nl, mildly dilated LA, PASP nl   Fatty liver disease, nonalcoholic 1194   History of blood transfusion      with heart surgery   HLD (hyperlipidemia)     Labile hypertension      a. prior renal ngiogram negative for RAS in 03/2016; b. catecholamines and metanephrines normal, mildly elevated renin with normal aldosterone and normal ratio in 02/2016   Myocardial infarction [facility]) 2017   Obesity     PAD (peripheral artery disease) ([place])      a. 09/2018 s/p L SFA stenting; b. 07/2019 Periph Angio: Patent m/d L SFA stent w/ 100% L SFA distal to stent. L AT 100d, L Peroneal diff dzs-->Med Rx; c. 02/2021 ABIs: stable @ 0.61 on R and 0.46 on L.   PTSD (post-traumatic stress disorder)     Tobacco abuse     Type 2 diabetes mellitus ([place]) 12/2015    Past Surgical History:       Past Surgical History:  Procedure Laterality Date   ABDOMINAL AORTOGRAM W/LOWER EXTREMITY [name] 10/15/2018    Procedure: ABDOMINAL AORTOGRAM W/LOWER EXTREMITY;   Surgeon: Wellington Hampshire, MD;  Location: [place] [facility];  Service: Cardiovascular;  Laterality: [name];   ABDOMINAL AORTOGRAM W/LOWER EXTREMITY Bilateral 08/19/2019    Procedure: ABDOMINAL AORTOGRAM W/LOWER EXTREMITY;  Surgeon: Wellington Hampshire, MD;  Location: [place] [facility];  Service: Cardiovascular;  Laterality: Bilateral;   CARDIAC CATHETERIZATION [name] 06/29/2016    Procedure: Left Heart Cath and Coronary Angiography;  Surgeon: Minna Merritts, MD;  Location: [place];  Service: Cardiovascular;  Laterality: [name];   CARDIAC CATHETERIZATION [name] 08/29/2016    Procedure: Left Heart Cath and Cors/Grafts Angiography;  Surgeon: Wellington Hampshire, MD;  Location: [place] [facility];  Service: Cardiovascular;  Laterality: [name];   CESAREAN SECTION       CHOLECYSTECTOMY       CORONARY ARTERY BYPASS GRAFT [name] 07/06/2016    Procedure: CORONARY ARTERY BYPASS GRAFTING (CABG) x four, using left internal mammary artery and right leg greater saphenous vein harvested endoscopically;  Surgeon: Ivin Poot, MD;  Location: [place] [place];  Service: Open Heart Surgery;  Laterality: [name];   ENDARTERECTOMY Right 07/06/2016    Procedure: ENDARTERECTOMY CAROTID;  Surgeon: Rosetta Posner, MD;  Location: [place];  Service: Vascular;  Laterality: Right;  ENDARTERECTOMY Right 04/27/2020    Procedure: REDO OF RIGHT ENDARTERECTOMY CAROTID;  Surgeon: Rosetta Posner, MD;  Location: [place];  Service: Vascular;  Laterality: Right;   LEFT HEART CATH AND CORS/GRAFTS ANGIOGRAPHY [name] 08/24/2020    Procedure: LEFT HEART CATH AND CORS/GRAFTS ANGIOGRAPHY;  Surgeon: Wellington Hampshire, MD;  Location: [place] [facility];  Service: Cardiovascular;  Laterality: [name];   PERIPHERAL VASCULAR CATHETERIZATION [name] 04/18/2016    Procedure: Renal Angiography;  Surgeon: Wellington Hampshire, MD;  Location: [place] [facility];  Service: Cardiovascular;  Laterality: [name];   PERIPHERAL VASCULAR INTERVENTION Left 10/15/2018    Procedure: PERIPHERAL VASCULAR  INTERVENTION;  Surgeon: Wellington Hampshire, MD;  Location: [place] [facility];  Service: Cardiovascular;  Laterality: Left;  Left superficial femoral   TEE WITHOUT CARDIOVERSION [name] 07/06/2016    Procedure: TRANSESOPHAGEAL ECHOCARDIOGRAM (TEE);  Surgeon: Ivin Poot, MD;  Location: [place];  Service: Open Heart Surgery;  Laterality: [name];   TONSILLECTOMY          Assessment & Plan Clinical Impression: Patient is a [name] with history of CAD s/p CABG, R-CEA X 2, T2DM, fatty liver, chronic pain, PTSD who was admitted on 06/24/21 with reports of left sided weakness and fall progressing to left facial weakness with dysarthria. CTA/CT perfusion was negative for core infarct or penumbra and showed probable subacute right occipital infarct with petechial hemorrhage.  MRI brain done revealing acute right pontine perforator infarct and area of encephalomalacia with gliosis in  right parietal and occipital lobes. ASA added to home dose plavix with recommendations for life long use due to severity of intracranial atherosclerosis.    CTA chest was suboptimal study but negative for PE and showed patchy densities in RUL. She was started on IV unasyn due to concerns of Aspiration PNA but continued to have leucocytosis therefore blood cultures done 07/03 and pending. Dr. Rayann Heman consulted due to elevated troponin and felt that it was likely due to demand ischemia in setting of stroke and no evidence of ACS.  Patient with long history of  poorly controlled BP and medications being adjusted.  2D echo showed EF 60-65% with no wall abnormality. MRI brain repeated on 07/05 due to neurological changes with dense left hemiplegia and was negative for progression or new changes. She was noted to be hypotensive the day before and worsening of symptoms felt to be due to hypotension and neurology recommended SBP goal 140-180 range and work up ordered for reversible causes of delirium. She was started on Vitamin B12 and  thiamine empirically.   Patient transferred to CIR on 06/28/2021 .  Met with pt and husband to discuss leisure education, activity analysis/modifications, coping strategies.  Pt presents with decreased activity tolerance, decreased functional mobility, decreased balanced, decreased coordination, decreased memory, delayed processing Limiting pt's independence with leisure/community pursuits.  Plan  Min 1 TR session >20 minutes during LOS  Recommendations for other services: None   Discharge Criteria: Patient will be discharged from TR if patient refuses treatment 3 consecutive times without medical reason.  If treatment goals not met, if there is a change in medical status, if patient makes no progress towards goals or if patient is discharged from hospital.  The above assessment, treatment plan, treatment alternatives and goals were discussed and mutually agreed upon: by patient  [place] 07/20/2021, 3:23 PM

## 2021-07-20 NOTE — Progress Notes (Signed)
Occupational Therapy Session Note  Patient Details  Name: Erika [name] MRN: [id number] Date of Birth: 1971-02-08  Today's Date: 07/20/2021 OT Individual Time: 2585-2778 OT Individual Time Calculation (min): 55 min    Short Term Goals: Week 3:  OT Short Term Goal 1 (Week 3): STGs=LTGs due to ELOS  Skilled Therapeutic Interventions/Progress Updates:    Pt received in wc ready for therapy. Pt taken to gym and completed squat pivot transfer to mat with min A. Focus of session was on LUE NMR.  Worked on hand over hand guiding of hand for grasping cones (full A) with estim of finger and wrist flexors during release of cones.  Used empi estim on small muscle atrophy setting at intensity 25.  Pt tolerated estim well with no skin reactions.  Then did 5 min of estim for finger flexors.  Pt transferred back to wc and returned to room to have her husband Aeronautical engineer stedy transfers from wc to toilet to recliner. He has requested several times to be cleared to help her as she often has urgency to toilet. He has observed and helped participate in transfers several times. Today he demonstrated that he was able to safely help her and he did well using the equipment. He was cleared on the safety plan to use the stedy to the toilet.  Pt resting in recliner with all needs met with spouse in the room.   Therapy Documentation Precautions:  Precautions Precautions: Fall Precaution Comments: L hemiplegia Restrictions Weight Bearing Restrictions: No LUE Weight Bearing: Weight bearing as tolerated LLE Weight Bearing: Weight bearing as tolerated    Vital Signs: Therapy Vitals Temp: 97.7 F (36.5 C) Temp Source: Oral Pulse Rate: 76 Resp: 18 BP: (!) 147/76 Patient Position (if appropriate): Lying Oxygen Therapy SpO2: 95 % O2 Device: Room Air Pain: Pain Assessment Pain Scale: 0-10 Pain Score: 8  Pain Type: Acute pain Pain Location: Head Pain Orientation: Right Pain Descriptors /  Indicators: Headache Pain Frequency: Intermittent Pain Onset: Gradual Pain Intervention(s): Medication (See eMAR) ADL: ADL Eating: Set up Grooming: Setup Upper Body Bathing: Contact guard Where Assessed-Upper Body Bathing: Shower Lower Body Bathing: Minimal assistance Where Assessed-Lower Body Bathing: Shower Upper Body Dressing: Minimal assistance Lower Body Dressing: Moderate assistance Toileting: Moderate assistance Where Assessed-Toileting: Bedside Commode Toilet Transfer: Minimal assistance Toilet Transfer Method: Squat pivot Toilet Transfer Equipment: Drop arm bedside commode Social research officer, government: Minimal assistance Social research officer, government Method: Education officer, environmental: Radio broadcast assistant, Grab bars  Therapy/Group: Individual Therapy  [place] 07/20/2021, 8:30 AM

## 2021-07-20 NOTE — Progress Notes (Signed)
PROGRESS NOTE   Subjective/Complaints:   ROS: Patient denies CP, SOB, N/V/D,   Objective:   No results found. No results for input(s): WBC, HGB, HCT, PLT in the last 72 hours.  No results for input(s): NA, K, CL, CO2, GLUCOSE, BUN, CREATININE, CALCIUM in the last 72 hours.   Intake/Output Summary (Last 24 hours) at 07/20/2021 0710 Last data filed at 07/19/2021 1900 Gross per 24 hour  Intake 240 ml  Output --  Net 240 ml          Physical Exam: Vital Signs Blood pressure (!) 147/76, pulse 76, temperature 97.7 F (36.5 C), temperature source Oral, resp. rate 18, height '5\' 6"'$  (1.676 m), weight 95.4 kg, SpO2 95 %.  General: No acute distress Mood and affect are appropriate Heart: Regular rate and rhythm no rubs murmurs or extra sounds Lungs: Clear to auscultation, breathing unlabored, no rales or wheezes Abdomen: Positive bowel sounds, soft nontender to palpation, nondistended Extremities: No clubbing, cyanosis, or edema Skin: No evidence of breakdown, no evidence of rash    Neurological:    Mental Status: She is alert and oriented to person, place, and time.    Comments: Left facial weakness with mild dysarthria. Able to answer orientation questions and follow commands without difficulty.  0/5 LUE and trace-1/5 prox to 0/5 distally LLE 4/5 on Right side    Assessment/Plan: 1. Functional deficits which require 3+ hours per day of interdisciplinary therapy in a comprehensive inpatient rehab setting. Physiatrist is providing close team supervision and 24 hour management of active medical problems listed below. Physiatrist and rehab team continue to assess barriers to discharge/monitor patient progress toward functional and medical goals  Care Tool:  Bathing    Body parts bathed by patient: Abdomen, Chest, Left arm, Right arm, Front perineal area, Right upper leg, Left upper leg, Right lower leg, Left lower  leg, Face, Buttocks   Body parts bathed by helper: Right arm, Buttocks, Right lower leg, Left lower leg     Bathing assist Assist Level: Contact Guard/Touching assist     Upper Body Dressing/Undressing Upper body dressing   What is the patient wearing?: Pull over shirt    Upper body assist Assist Level: Minimal Assistance - Patient > 75%    Lower Body Dressing/Undressing Lower body dressing      What is the patient wearing?: Pants     Lower body assist Assist for lower body dressing: Minimal Assistance - Patient > 75%     Toileting Toileting    Toileting assist Assist for toileting: Minimal Assistance - Patient > 75%     Transfers Chair/bed transfer  Transfers assist     Chair/bed transfer assist level: Moderate Assistance - Patient 50 - 74%     Locomotion Ambulation   Ambulation assist      Assist level: 2 helpers Assistive device: Hand held assist Max distance: 35   Walk 10 feet activity   Assist     Assist level: 2 helpers Assistive device: Hand held assist   Walk 50 feet activity   Assist Walk 50 feet with 2 turns activity did not occur: Safety/medical concerns  Assist level: Dependent - Patient 0%  Assistive device: Lite Gait    Walk 150 feet activity   Assist Walk 150 feet activity did not occur: Safety/medical concerns         Walk 10 feet on uneven surface  activity   Assist Walk 10 feet on uneven surfaces activity did not occur: Safety/medical concerns         Wheelchair     Assist Will patient use wheelchair at discharge?: No Type of Wheelchair: Manual    Wheelchair assist level: Supervision/Verbal cueing Max wheelchair distance: 150f    Wheelchair 50 feet with 2 turns activity    Assist        Assist Level: Supervision/Verbal cueing   Wheelchair 150 feet activity     Assist          Blood pressure (!) 147/76, pulse 76, temperature 97.7 F (36.5 C), temperature source Oral, resp. rate  18, height '5\' 6"'$  (1.676 m), weight 95.4 kg, SpO2 95 %.    Medical Problem List and Plan: 1.  Right pontine stroke             -patient may shower             -ELOS: 8/9 extended due to upgrade of goals to supervision so elderly caregiver can assist    -Continue CIR therapies including PT, OT, and SLP - Estim patch to finger and wrist extensors  -grounds pass prn  -Interdisciplinary Team Conference today   2.  Impaired mobility: Continue Lovenox             -antiplatelet therapy: Continue ASA/Plavix 3. Cluster headaches and migraines: continue abortive oxygen PRN. Oxycodone prn for HA. Discussed outpatient Botox- patient would like to try. D/c periactin and phenergan as patient not using 4. Mood: Team to provide ego support. LCSW to follow for evaluation and support.             -antipsychotic agents: Lamictal daily.  -asked pt to let u[place]ore[place]now if she needs help with her mood/coping.  5. Neuropsych: This patient is capable of making decisions on her own behalf. 6. Skin/Wound Care: Routine pressure relief measures. 7. Fluids/Electrolytes/Nutrition: Monitor I/O. Check lytes in am. 8. HTN: SBP goal 140-180 range to avoid hypoperfusion of brain. Per husband "acts drunk when BP is low" --At goal, continue Imdur, Coreg, aldactone, Demadex,  Vitals:   07/19/21 2101 07/20/21 0622  BP: (!) 160/75 (!) 147/76  Pulse: 68 76  Resp: 18 18  Temp: 98 F (36.7 C) 97.7 F (36.5 C)  SpO2: 93% 95%  Decrease coreg to 3.'125mg'$  daily- in desired range 9. T2DM: Hgb A1C- 10.7. BS run 200's at home. Dietary education/reinforcement.             --Continue Lantus  45 units BID with SSI for elevated BS             Continue decreased Lantus to 22U BID CBG (last 3)  Recent Labs    07/19/21 1617 07/19/21 2103 07/20/21 0620  GLUCAP 120* 262* 126*    CBG labile mainly after dinner elevation - pt states she eats peach cobbler for dessert , ask pt not to eat tonite, reinforced with husband monitor effect in am   10. CAD s/p CABG: On ASA, coreg, Cozaar, Demadex, ASA, 11. PAD w/claudication: Has been smoking 1 PPD --quit at admission. Continue Nicotine patch. --On lyrica BID to manage symptoms.   12. Aspiration PNA: CT reviewed, appreciate medicine consult,  Resolved off abx may d/c Midline 13.  Chronic cluster headaches, currently well controlled: Managed with thorazine as well as oxycodone and high flow oxygen prn. Decreased oxycodone to TID PRN and edited order to indicate it should be given after therapy as per husband's request- makes her too sedated to tolerate therapy. 7/16- added Periactin prn for associated Sx's-con't oxy and O2.     14. H/o depression/anxiety: Continue Lamictal, Prozac with Klonopin prn. 15. Chronic insomnia: Managed with home dose trazodone. 16. Dyslipidemia: Trig- 636, direct LDL-99.6, HDL 29, Chol 202. On Zetia--to start Vespc 17. Vitamin B 12 deficiency: B12- 169 (was WNL a year ago) 18. NASH/Fatty liver: LFTs reviewed and normalized 19. Dense left sided hemiplegia: [place] regarding NMES- started trials Friday. They have TENS unit at home.  20. Hypokalemia: supplement 53mq 7/7 and repeat BMP reviewed, K+ improved to 3.8. Resolved. D/c daily supplement.  21. Leukocytosis: trending upward potentially secondary to aspiration pneumonia  7/18 still some elevation today   -afebrile, no s/s on exam   -monitor serially   -hospitalist following 22. Vitamin D deficiency: continue ergocalciferol 50,000U weekly for 7 weeks.  23.  Hypoxia ,resolved  24. AKI: resolved  25. Poor perfusion of LLE  7/17- per IM, LLE is colder than RLE_ by a lot- and no DP pulse palpable- Vascular said con't current regimen and monitor closely for low perfusion injury. 7/18 leg feels warmer (by comparative accounts). Normal appearing   -recent ABI 0.46   26. Left post-stroke shoulder pain: d/c lidocaine patch and ordered voltaren gel. Apply kinesiology tape 27. S/p fall- lac to left knee,  Head CT negative- reviewed with patient and husband. Please use Stedy for all transfers.  28. Hypokalemia: supplement 248m on 7/25, monitor K+ weekly.  29. Disposition: Lives with husband and daughter (autistic but high functioning)- husband very involved and has already coordinated with his boss to take time off to care for her. Messaged April to schedule HFU/TC.    LOS: 22 days A FACE TO FACE EVALUATION WAS PERFORMED  AnCharlett Blake/28/2022, 7:10 AM

## 2021-07-21 LAB — GLUCOSE, CAPILLARY
Glucose-Capillary: 88 mg/dL (ref 70–99)
Glucose-Capillary: 140 mg/dL — ABNORMAL HIGH (ref 70–99)
Glucose-Capillary: 221 mg/dL — ABNORMAL HIGH (ref 70–99)
Glucose-Capillary: 184 mg/dL — ABNORMAL HIGH (ref 70–99)

## 2021-07-21 NOTE — Progress Notes (Signed)
Physical Therapy Session Note  Patient Details  Name: Erika [name] MRN: [id number] Date of Birth: [birth year]  Today's Date: 07/21/2021 PT Individual Time: 1500-1530 PT Individual Time Calculation (min): 30 min   Short Term Goals: Week 4:  PT Short Term Goal 1 (Week 4): Family training/education will be incorporated in therapy sessions to prepare for discharge and reduce caregiver burden PT Short Term Goal 2 (Week 4): Pt will complete sit<>stand transfers with CGA and LRAD PT Short Term Goal 3 (Week 4): Pt will consistently complete squat<>pivot transfers with minA and LRAD PT Short Term Goal 4 (Week 4): Pt will ambulate 10f with minA and LRAD  Skilled Therapeutic Interventions/Progress Updates:    Pt greeted seated in w/c at start of session and husband at bedside. Focus of session on functional gait training with HEMI WALKER. Donned L AFO with toatlA for time and wheeled to main rehab hallway in w/c. [facility]. Ambulated 471f+ 4411fith minA and HW (!!!) with husband providing +2 assist for w/c follow for safety. Min cues needed for both widening BOS and keeping HW laterally to allow step through of R foot. She demo's step-to gait pattern and would benefit from working on step-through pattern next session. Mild hip hiking on LLE but able to initiate hip flexion with adequate quad facilitation without knee buckling present. Pt returned to room at end of session with husband at bedside, planning on taking pt outside for fresh air. All needs met.  Therapy Documentation Precautions:  Precautions Precautions: Fall Precaution Comments: L hemiplegia Restrictions Weight Bearing Restrictions: No LUE Weight Bearing: Weight bearing as tolerated LLE Weight Bearing: Weight bearing as tolerated General:    Therapy/Group: Individual Therapy  ChrAlger Simons29/2022, 7:39 AM

## 2021-07-21 NOTE — Plan of Care (Signed)
  Problem: Consults Goal: RH STROKE PATIENT EDUCATION Description: See Patient Education module for education specifics  Outcome: Progressing   Problem: RH BOWEL ELIMINATION Goal: RH STG MANAGE BOWEL WITH ASSISTANCE Description: STG Manage Bowel with mod I Assistance. Outcome: Progressing Goal: RH STG MANAGE BOWEL W/MEDICATION W/ASSISTANCE Description: STG Manage Bowel with Medication with mod i Assistance. Outcome: Progressing   Problem: RH SAFETY Goal: RH STG ADHERE TO SAFETY PRECAUTIONS W/ASSISTANCE/DEVICE Description: STG Adhere to Safety Precautions With cues/reminders Assistance/Device. Outcome: Progressing   Problem: RH COGNITION-NURSING Goal: RH STG USES MEMORY AIDS/STRATEGIES W/ASSIST TO PROBLEM SOLVE Description: STG Uses Memory Aids/Strategies With cues/reminders Assistance to Problem Solve. Outcome: Progressing   Problem: RH PAIN MANAGEMENT Goal: RH STG PAIN MANAGED AT OR BELOW PT'S PAIN GOAL Description: At or below level 4 Outcome: Progressing   Problem: RH KNOWLEDGE DEFICIT Goal: RH STG INCREASE KNOWLEDGE OF DIABETES Description: Patient will be able to manage DM with medications and dietary modifications using handouts and educational tools independently Outcome: Progressing Goal: RH STG INCREASE KNOWLEDGE OF HYPERTENSION Description: Patient will be able to manage HTN with medications and dietary modifications using handouts and educational tools independently Outcome: Progressing Goal: RH STG INCREASE KNOWLEGDE OF HYPERLIPIDEMIA Description: Patient will be able to manage HLD with medications and dietary modifications using handouts and educational tools independently Outcome: Progressing Goal: RH STG INCREASE KNOWLEDGE OF STROKE PROPHYLAXIS Description: Patient will be able to manage secondary stroke risks with medications and dietary modifications using handouts and educational tools independently Outcome: Progressing   Problem: RH KNOWLEDGE  DEFICIT Goal: RH STG INCREASE KNOWLEDGE OF DYSPHAGIA/FLUID INTAKE Description: Patient will be able to manage Dysphagia , medications and dietary modifications using handouts and educational tools independently Outcome: Progressing

## 2021-07-21 NOTE — Progress Notes (Signed)
Physical Therapy Session Note  Patient Details  Name: Erika [name] MRN: [id number] Date of Birth: 03/20/1971  Today's Date: 07/21/2021 PT Individual Time: 0832-0901 PT Individual Time Calculation (min): 29 min   Short Term Goals: Week 4:  PT Short Term Goal 1 (Week 4): Family training/education will be incorporated in therapy sessions to prepare for discharge and reduce caregiver burden PT Short Term Goal 2 (Week 4): Pt will complete sit<>stand transfers with CGA and LRAD PT Short Term Goal 3 (Week 4): Pt will consistently complete squat<>pivot transfers with minA and LRAD PT Short Term Goal 4 (Week 4): Pt will ambulate 34f with minA and LRAD  Skilled Therapeutic Interventions/Progress Updates:  Patient seated in recliner on entrance to room completing breakfast. Patient alert and agreeable to PT session. Patient with no pain complaint throughout session. Socks and shoes with L AFO donned prior to transfer to w/c using HW.   Therapeutic Activity: Transfers: Patient performed SPVT transfer recliner to w/c using RW and Min/ Mod A for balance and block to L knee as well as L foot positioning as pt has not used HW previously. One instance of knee buckle with Min A to correct. Pt able to swing hips to side and sit uncontrolled in seat of w/c with Mod A. Provided vc/tc for pivot stepping technique. STS performed w/c <> HW during session with Min A for power up and improving to CGA.   Gait Training:  Pt educated on use of HW during transfer and provided with visual demonstration of techniques with use. Pt willing to attempt for gait training this session. Wheeled to hallway with HR to R side for safety. Max cues initially for technique including upright posture, retracted shoulders, L hip extension/ bringing L hip forward with swing through. Requires fewer cues throughout. She ambulated 35 feet using HW with min A. Demonstrated improved foot clearance with mild lean to R and L hip hike.  Provided vc/ tc for increasing L knee/ hip flexion.  Patient seated upright in w/c at end of session with brakes locked, belt alarm set, and all needs within reach.     Therapy Documentation Precautions:  Precautions Precautions: Fall Precaution Comments: L hemiplegia Restrictions Weight Bearing Restrictions: No LUE Weight Bearing: Weight bearing as tolerated LLE Weight Bearing: Weight bearing as tolerated  Therapy/Group: Individual Therapy  JAlger SimonsPT, DPT 07/21/2021, 7:53 AM

## 2021-07-21 NOTE — Progress Notes (Signed)
Speech Language Pathology Daily Session Note  Patient Details  Name: Erika [name] MRN: [id number] Date of Birth: 1970-12-25  Today's Date: 07/21/2021 SLP Individual Time: 1300-1345 SLP Individual Time Calculation (min): 45 min  Short Term Goals: Week 4: SLP Short Term Goal 1 (Week 4): Patient will perform mildly complex problem solving and reasoning tasks with minA cues for accuracy. SLP Short Term Goal 2 (Week 4): Patient will demonstrate alternating attention during basic to mildly complex functional tasks at supervision to minA level. SLP Short Term Goal 3 (Week 4): Patient will tolerate regular texture solids, thin liquids during meals with intermittent A for swallow safety. SLP Short Term Goal 4 (Week 4): Patient will demonstrate anticipatory awareness during functional tasks and planning for future discharge home, with minA.  Skilled Therapeutic Interventions:   Patient seen for skilled ST session with focus on cognitive function goals. Patient reported that she is enjoying regular texture solids and was able to order a salad for lunch. SLP introduced task of finding and attempting to correct errors in simulated check register task. Patient did require min-modA to locate specific errors and when attempting to correct errors, she did require extended amount of time as she made frequent errors using calculator. (Working too quickly, decreased alternating attention. When she then completed simplified check register task, her total $ amount was $32 off. SLP observed that patient would frequently write incorrect number when transferring from calculator to paper. Overall, patient continues to demonstrate good progress and continues to benefit from skilled SLP intervention to maximize cognitive and swallow function goals prior to discharge.  Pain Pain Assessment Pain Scale: 0-10 Pain Score: 0-No pain  Therapy/Group: Individual Therapy   Erika Baller, MA, CCC-SLP Speech Therapy

## 2021-07-21 NOTE — Progress Notes (Signed)
Occupational Therapy Session Note  Patient Details  Name: Erika [name] MRN: [id number] Date of Birth: [birth year]  Today's Date: 07/21/2021 OT Individual Time: 0935-1020 OT Individual Time Calculation (min): 45 min    Short Term Goals: Week 3:  OT Short Term Goal 1 (Week 3): STGs=LTGs due to ELOS  Skilled Therapeutic Interventions/Progress Updates:    Pt seen for ADL training of shower and dressing with family ed with pt's daughter.  Had daughter observe transfers as hopefully pt will be more independent by the time she goes home in a few weeks.  Pt received in w/c ready for a shower, she completed squat pivot to tub bench with bar with min to mod A. She continues to need to cues to lean her head in opposite direction of hips.  In shower, pt used long handled sponge to reach feet and R arm. Pt stood in shower but unable to let go with  R hand to wash bottom due to feeling dizzy. Pt sat back on bench to rest.  Stood 2nd time and therapist helped wash her bottom.   From wc pt worked on dressing. Min A overall and educated daughter on how to cue pt for hemidressing techniques.  Pt did well standing with bed rail support at her right and then releasing her hand to pull pants over hips 90% of the way.   Mod A with socks and shoes.    Kinesiotape removed from shoulder while pt in shower. Tape removed slowly using soap and water. Her skin was red undertape.  Recommend not using tape again unless pt begins to have L shoulder pain again.  Pt resting in wc with belt alarm on and arm tray on and all needs met.   Therapy Documentation Precautions:  Precautions Precautions: Fall Precaution Comments: L hemiplegia Restrictions Weight Bearing Restrictions: No LUE Weight Bearing: Weight bearing as tolerated LLE Weight Bearing: Weight bearing as tolerated      Pain: no c/o pain   ADL: ADL Eating: Set up Grooming: Setup Upper Body Bathing: Contact guard Where Assessed-Upper Body  Bathing: Shower Lower Body Bathing: Minimal assistance Where Assessed-Lower Body Bathing: Shower Upper Body Dressing: Minimal assistance Lower Body Dressing: Moderate assistance Toileting: Moderate assistance Where Assessed-Toileting: Bedside Commode Toilet Transfer: Minimal assistance Toilet Transfer Method: Squat pivot Toilet Transfer Equipment: Drop arm bedside commode Social research officer, government: Minimal assistance Social research officer, government Method: Education officer, environmental: Radio broadcast assistant, Grab bars   Therapy/Group: Individual Therapy  [employer] 07/21/2021, 10:55 AM

## 2021-07-21 NOTE — Progress Notes (Signed)
Occupational Therapy Session Note  Patient Details  Name: Erika [name] MRN: [id number] Date of Birth: 12/17/1971  Today's Date: 07/21/2021 OT Group Time: 1100-1200 OT Group Time Calculation (min): 60 min   Short Term Goals: Week 3:  OT Short Term Goal 1 (Week 3): STGs=LTGs due to ELOS  Skilled Therapeutic Interventions/Progress Updates:  Pt was seen for skilled group session with focus of group session on stress management, coping strategies and social engagement. Education provided on factors that contribute to stress such as daily hassles, major life stressors, and life circumstance. Pt participated by sharing major and daily life stressors with group, such as difficulty with managing her BP, offered emotional support and education on tips for managing BP. Education provided on healthy coping strategies to implement into pts routine to manage stressors, pt participated by sharing healthy coping strategies of sharing daily uplifts such as "having her husband to support her." Provided pt with handouts to increase carryover of tips for managing stressors, pt transported back to room by RT.   Therapy Documentation Precautions:  Precautions Precautions: Fall Precaution Comments: L hemiplegia Restrictions Weight Bearing Restrictions: No LUE Weight Bearing: Weight bearing as tolerated LLE Weight Bearing: Weight bearing as tolerated   Pain:  Pt reports no pain during session.   Therapy/Group: Group Therapy  Precious Haws 07/21/2021, 1:43 PM

## 2021-07-21 NOTE — Progress Notes (Signed)
Recreational Therapy Session Note  Patient Details  Name: Erika [name] MRN: [id number] Date of Birth: Sep 02, 1971 Today's Date: 07/21/2021 Time:  11-12 Pain: no c/o Skilled Therapeutic Interventions/Progress Updates: Pt referred by team for participation in Stress Management/Relaxation Training group with LRT & COTA co-facilitating discussion on stress exploration.  Discussion/education included identifying stressors, categorizing them, & identifying potential coping strategies.  Strategies included deep breathing exercise, progressive muscle relaxation, challenging irrational thoughts & imagery.  Pt participated in group discussion, took notes and provided with education materials for in room and home use.  Therapy/Group: Group Therapy   Lenin Kuhnle 07/21/2021, 12:38 PM

## 2021-07-21 NOTE — Progress Notes (Signed)
PROGRESS NOTE   Subjective/Complaints:  No issues overnight Pt without HA pain this am, hx cluster HA   ROS: Patient denies CP, SOB, N/V/D,   Objective:   No results found. No results for input(s): WBC, HGB, HCT, PLT in the last 72 hours.  No results for input(s): NA, K, CL, CO2, GLUCOSE, BUN, CREATININE, CALCIUM in the last 72 hours.  No intake or output data in the 24 hours ending 07/21/21 0829        Physical Exam: Vital Signs Blood pressure (!) 181/92, pulse 81, temperature (!) 97.5 F (36.4 C), temperature source Oral, resp. rate 20, height '5\' 6"'$  (1.676 m), weight 95.4 kg, SpO2 99 %.  General: No acute distress Mood and affect are appropriate Heart: Regular rate and rhythm no rubs murmurs or extra sounds Lungs: Clear to auscultation, breathing unlabored, no rales or wheezes Abdomen: Positive bowel sounds, soft nontender to palpation, nondistended Extremities: No clubbing, cyanosis, or edema Skin: No evidence of breakdown, no evidence of rash   Neurological:    Mental Status: She is alert and oriented to person, place, and time.    Comments: Left facial weakness with mild dysarthria. Able to answer orientation questions and follow commands without difficulty.  0/5 LUE and trace-1/5 prox to 0/5 distally LLE 4/5 on Right side    Assessment/Plan: 1. Functional deficits which require 3+ hours per day of interdisciplinary therapy in a comprehensive inpatient rehab setting. Physiatrist is providing close team supervision and 24 hour management of active medical problems listed below. Physiatrist and rehab team continue to assess barriers to discharge/monitor patient progress toward functional and medical goals  Care Tool:  Bathing    Body parts bathed by patient: Abdomen, Chest, Left arm, Right arm, Front perineal area, Right upper leg, Left upper leg, Right lower leg, Left lower leg, Face, Buttocks   Body  parts bathed by helper: Right arm, Buttocks, Right lower leg, Left lower leg     Bathing assist Assist Level: Contact Guard/Touching assist     Upper Body Dressing/Undressing Upper body dressing   What is the patient wearing?: Pull over shirt    Upper body assist Assist Level: Minimal Assistance - Patient > 75%    Lower Body Dressing/Undressing Lower body dressing      What is the patient wearing?: Pants     Lower body assist Assist for lower body dressing: Minimal Assistance - Patient > 75%     Toileting Toileting    Toileting assist Assist for toileting: Minimal Assistance - Patient > 75%     Transfers Chair/bed transfer  Transfers assist     Chair/bed transfer assist level: Moderate Assistance - Patient 50 - 74%     Locomotion Ambulation   Ambulation assist      Assist level: Moderate Assistance - Patient 50 - 74% Assistive device: Walker-rolling Max distance: 25   Walk 10 feet activity   Assist     Assist level: Moderate Assistance - Patient - 50 - 74% Assistive device: Walker-rolling   Walk 50 feet activity   Assist Walk 50 feet with 2 turns activity did not occur: Safety/medical concerns  Assist level: Dependent - Patient 0%  Assistive device: Lite Gait    Walk 150 feet activity   Assist Walk 150 feet activity did not occur: Safety/medical concerns         Walk 10 feet on uneven surface  activity   Assist Walk 10 feet on uneven surfaces activity did not occur: Safety/medical concerns         Wheelchair     Assist Will patient use wheelchair at discharge?: Yes Type of Wheelchair: Manual    Wheelchair assist level: Supervision/Verbal cueing Max wheelchair distance: 152f    Wheelchair 50 feet with 2 turns activity    Assist        Assist Level: Supervision/Verbal cueing   Wheelchair 150 feet activity     Assist      Assist Level: Supervision/Verbal cueing   Blood pressure (!) 181/92, pulse 81,  temperature (!) 97.5 F (36.4 C), temperature source Oral, resp. rate 20, height '5\' 6"'$  (1.676 m), weight 95.4 kg, SpO2 99 %.    Medical Problem List and Plan: 1.  Right pontine stroke             -patient may shower             -ELOS: 8/9 extended due to upgrade of goals to supervision so elderly caregiver can assist  pt forgot about new date , she then recalled the change   -Continue CIR therapies including PT, OT, and SLP - Estim patch to finger and wrist extensors  -grounds pass prn  -Interdisciplinary Team Conference today   2.  Impaired mobility: Continue Lovenox             -antiplatelet therapy: Continue ASA/Plavix 3. Cluster headaches and migraines: continue abortive oxygen PRN. Oxycodone prn for HA. Discussed outpatient Botox- patient would like to try. D/c periactin and phenergan as patient not using 4. Mood: Team to provide ego support. LCSW to follow for evaluation and support.             -antipsychotic agents: Lamictal daily.  -asked pt to let [place] if she needs help with her mood/coping.  5. Neuropsych: This patient is capable of making decisions on her own behalf. 6. Skin/Wound Care: Routine pressure relief measures. 7. Fluids/Electrolytes/Nutrition: Monitor I/O. Check lytes in am. 8. HTN: SBP goal 140-180 range to avoid hypoperfusion of brain. Per husband "acts drunk when BP is low" --At goal, continue Imdur, Coreg, aldactone, Demadex,  Vitals:   07/20/21 1924 07/21/21 0635  BP: (!) 144/79 (!) 181/92  Pulse: 81 81  Resp: 18 20  Temp: 97.9 F (36.6 C) (!) 97.5 F (36.4 C)  SpO2: 95% 99%  Decrease coreg to 3.'125mg'$  daily- in desired range 9. T2DM: Hgb A1C- 10.7. BS run 200's at home. Dietary education/reinforcement.             --Continue Lantus  45 units BID with SSI for elevated BS             Continue decreased Lantus to 22U BID CBG (last 3)  Recent Labs    07/20/21 1645 07/20/21 2108 07/21/21 0623  GLUCAP 208* 150* 88    CBG labile mainly after dinner  elevation - pt states she eats peach cobbler for dessert , ask pt not to eat tsome improvement with CBG yesterday pm , had jello and applesauce instead 10. CAD s/p CABG: On ASA, coreg, Cozaar, Demadex, ASA, 11. PAD w/claudication: Has been smoking 1 PPD --quit at admission. Continue Nicotine patch. --On lyrica BID to manage symptoms.  12. Aspiration PNA: CT reviewed, appreciate medicine consult,  Resolved off abx may d/c Midline 13. Chronic cluster headaches, currently well controlled: Managed with thorazine as well as oxycodone and high flow oxygen prn. Decreased oxycodone to TID PRN and edited order to indicate it should be given after therapy as per husband's request- makes her too sedated to tolerate therapy. 7/16- added Periactin prn for associated Sx's-con't oxy and O2.     14. H/o depression/anxiety: Continue Lamictal, Prozac with Klonopin prn. 15. Chronic insomnia: Managed with home dose trazodone. 16. Dyslipidemia: Trig- 636, direct LDL-99.6, HDL 29, Chol 202. On Zetia--to start Vespc 17. Vitamin B 12 deficiency: B12- 169 (was WNL a year ago) 18. NASH/Fatty liver: LFTs reviewed and normalized 19. Dense left sided hemiplegia: [place] regarding NMES- started trials Friday. They have TENS unit at home.  20. Hypokalemia: supplement 55mq 7/7 and repeat BMP reviewed, K+ improved to 3.8. Resolved. D/c daily supplement.  21. Leukocytosis: trending upward potentially secondary to aspiration pneumonia  7/18 still some elevation today   -afebrile, no s/s on exam   -monitor serially   -hospitalist following 22. Vitamin D deficiency: continue ergocalciferol 50,000U weekly for 7 weeks.  23.  Hypoxia ,resolved  24. AKI: resolved  25. Poor perfusion of LLE  7/17- per IM, LLE is colder than RLE_ by a lot- and no DP pulse palpable- Vascular said con't current regimen and monitor closely for low perfusion injury. 7/18 leg feels warmer (by comparative accounts). Normal appearing   -recent  ABI 0.46   26. Left post-stroke shoulder pain: d/c lidocaine patch and ordered voltaren gel. Apply kinesiology tape 27. S/p fall- lac to left knee, Head CT negative- reviewed with patient and husband. Please use Stedy for all transfers.  28. Hypokalemia: supplement 210m on 7/25, monitor K+ weekly.  29. Disposition: Lives with husband and daughter (autistic but high functioning)- husband very involved and has already coordinated with his boss to take time off to care for her. Messaged April to schedule HFU/TC.    LOS: 23 days A FACE TO FACE EVALUATION WAS PERFORMED  AnCharlett Blake/29/2022, 8:29 AM

## 2021-07-22 LAB — GLUCOSE, CAPILLARY
Glucose-Capillary: 81 mg/dL (ref 70–99)
Glucose-Capillary: 184 mg/dL — ABNORMAL HIGH (ref 70–99)
Glucose-Capillary: 176 mg/dL — ABNORMAL HIGH (ref 70–99)
Glucose-Capillary: 157 mg/dL — ABNORMAL HIGH (ref 70–99)

## 2021-07-22 NOTE — Progress Notes (Signed)
PROGRESS NOTE   Subjective/Complaints: Patient sleepy Spoke with husband at length regarding her progress and care on the unit She is making incredible improvements!!  ROS: Patient denies CP, SOB, N/V/D,   Objective:   No results found. No results for input(s): WBC, HGB, HCT, PLT in the last 72 hours.  No results for input(s): NA, K, CL, CO2, GLUCOSE, BUN, CREATININE, CALCIUM in the last 72 hours.   Intake/Output Summary (Last 24 hours) at 07/22/2021 1532 Last data filed at 07/22/2021 1320 Gross per 24 hour  Intake 540 ml  Output --  Net 540 ml         Physical Exam: Vital Signs Blood pressure (!) 161/81, pulse 88, temperature 98.2 F (36.8 C), temperature source Oral, resp. rate 16, height '5\' 6"'$  (1.676 m), weight 95.4 kg, SpO2 96 %. Gen: no distress, normal appearing HEENT: oral mucosa pink and moist, NCAT Cardio: Reg rate Chest: normal effort, normal rate of breathing Abd: soft, non-distended Ext: no edema Psych: pleasant, normal affect Skin: intact  Neurological:    Mental Status: She is alert and oriented to person, place, and time.    Comments: Left facial weakness with mild dysarthria. Able to answer orientation questions and follow commands without difficulty.  0/5 LUE and trace-1/5 prox to 0/5 distally LLE 4/5 on Right side    Assessment/Plan: 1. Functional deficits which require 3+ hours per day of interdisciplinary therapy in a comprehensive inpatient rehab setting. Physiatrist is providing close team supervision and 24 hour management of active medical problems listed below. Physiatrist and rehab team continue to assess barriers to discharge/monitor patient progress toward functional and medical goals  Care Tool:  Bathing    Body parts bathed by patient: Abdomen, Chest, Left arm, Right arm, Front perineal area, Right upper leg, Left upper leg, Right lower leg, Left lower leg, Face   Body parts  bathed by helper: Buttocks     Bathing assist Assist Level: Minimal Assistance - Patient > 75%     Upper Body Dressing/Undressing Upper body dressing   What is the patient wearing?: Pull over shirt    Upper body assist Assist Level: Minimal Assistance - Patient > 75%    Lower Body Dressing/Undressing Lower body dressing      What is the patient wearing?: Pants     Lower body assist Assist for lower body dressing: Minimal Assistance - Patient > 75%     Toileting Toileting    Toileting assist Assist for toileting: Minimal Assistance - Patient > 75%     Transfers Chair/bed transfer  Transfers assist     Chair/bed transfer assist level: Moderate Assistance - Patient 50 - 74%     Locomotion Ambulation   Ambulation assist      Assist level: Moderate Assistance - Patient 50 - 74% Assistive device: Walker-rolling Max distance: 25   Walk 10 feet activity   Assist     Assist level: Moderate Assistance - Patient - 50 - 74% Assistive device: Walker-rolling   Walk 50 feet activity   Assist Walk 50 feet with 2 turns activity did not occur: Safety/medical concerns  Assist level: Dependent - Patient 0% Assistive device: Lite Gait  Walk 150 feet activity   Assist Walk 150 feet activity did not occur: Safety/medical concerns         Walk 10 feet on uneven surface  activity   Assist Walk 10 feet on uneven surfaces activity did not occur: Safety/medical concerns         Wheelchair     Assist Will patient use wheelchair at discharge?: Yes Type of Wheelchair: Manual    Wheelchair assist level: Supervision/Verbal cueing Max wheelchair distance: 13f    Wheelchair 50 feet with 2 turns activity    Assist        Assist Level: Supervision/Verbal cueing   Wheelchair 150 feet activity     Assist      Assist Level: Supervision/Verbal cueing   Blood pressure (!) 161/81, pulse 88, temperature 98.2 F (36.8 C), temperature  source Oral, resp. rate 16, height '5\' 6"'$  (1.676 m), weight 95.4 kg, SpO2 96 %.    Medical Problem List and Plan: 1.  Right pontine stroke             -patient may shower             -ELOS: 8/9 extended due to upgrade of goals to supervision so elderly caregiver can assist  pt forgot about new date , she then recalled the change   -Continue CIR therapies including PT, OT, and SLP - Estim patch to finger and wrist extensors  -grounds pass prn  2.  Impaired mobility: Continue Lovenox             -antiplatelet therapy: Continue ASA/Plavix 3. Cluster headaches and migraines: continue abortive oxygen PRN. Oxycodone prn for HA. Discussed outpatient Botox- patient would like to try. D/c periactin and phenergan as patient not using 4. Mood: Team to provide ego support. LCSW to follow for evaluation and support.             -antipsychotic agents: Lamictal daily.  -asked pt to let [place] if she needs help with her mood/coping.  5. Neuropsych: This patient is capable of making decisions on her own behalf. 6. Skin/Wound Care: Routine pressure relief measures. 7. Fluids/Electrolytes/Nutrition: Monitor I/O. Check lytes in am. 8. HTN: SBP goal 140-180 range to avoid hypoperfusion of brain. Per husband "acts drunk when BP is low" --At goal, continue Imdur, Coreg, aldactone, Demadex,  Vitals:   07/22/21 0624 07/22/21 1317  BP: 109/66 (!) 161/81  Pulse: 82 88  Resp: 18 16  Temp: 98.1 F (36.7 C) 98.2 F (36.8 C)  SpO2: 93% 96%  Decrease coreg to 3.'125mg'$  daily- in desired range 9. T2DM: Hgb A1C- 10.7. BS run 200's at home. Dietary education/reinforcement.             --Continue Lantus  45 units BID with SSI for elevated BS             Continue decreased Lantus to 22U BID CBG (last 3)  Recent Labs    07/21/21 2148 07/22/21 0625 07/22/21 1205  GLUCAP 184* 81 184*   CBG labile mainly after dinner elevation - pt states she eats peach cobbler for dessert , ask pt not to eat tsome improvement with  CBG yesterday pm , had jello and applesauce instead 10. CAD s/p CABG: On ASA, coreg, Cozaar, Demadex, ASA, 11. PAD w/claudication: Has been smoking 1 PPD --quit at admission. Continue Nicotine patch. --On lyrica BID to manage symptoms.   12. Aspiration PNA: CT reviewed, appreciate medicine consult,  Resolved off abx may d/c Midline  13. Chronic cluster headaches, currently well controlled: Managed with thorazine as well as oxycodone and high flow oxygen prn. Decreased oxycodone to TID PRN and edited order to indicate it should be given after therapy as per husband's request- makes her too sedated to tolerate therapy. 7/16- added Periactin prn for associated Sx's-con't oxy and O2.     14. H/o depression/anxiety: Continue Lamictal, Prozac with Klonopin prn. 15. Chronic insomnia: Managed with home dose trazodone. 16. Dyslipidemia: Trig- 636, direct LDL-99.6, HDL 29, Chol 202. On Zetia--to start Vespc 17. Vitamin B 12 deficiency: B12- 169 (was WNL a year ago) 18. NASH/Fatty liver: LFTs reviewed and normalized 19. Dense left sided hemiplegia: [place] regarding NMES- started trials Friday. They have TENS unit at home.  20. Hypokalemia: supplement 80mq 7/7 and repeat BMP reviewed, K+ improved to 3.8. Resolved. D/c daily supplement.  21. Leukocytosis: trending upward potentially secondary to aspiration pneumonia  7/18 still some elevation today   -afebrile, no s/s on exam   -monitor serially   -hospitalist following 22. Vitamin D deficiency: continue ergocalciferol 50,000U weekly for 7 weeks.  23.  Hypoxia ,resolved  24. AKI: resolved. Repeat Creatinine monday 25. Poor perfusion of LLE  7/17- per IM, LLE is colder than RLE_ by a lot- and no DP pulse palpable- Vascular said con't current regimen and monitor closely for low perfusion injury. 7/18 leg feels warmer (by comparative accounts). Normal appearing   -recent ABI 0.46 Consider nitroglycerin patch to improve blood flow, but may lower  BP and worsen headaches.  26. Left post-stroke shoulder pain: d/c lidocaine patch and ordered voltaren gel. Apply kinesiology tape. Use brace as needed.  260 S/p fall- lac to left knee, Head CT negative- reviewed with patient and husband. Please use Stedy for all transfers.  28. Hypokalemia: supplement 241m on 7/25, monitor K+ weekly. Repeat ordered for Monday.  29. Disposition: Lives with husband and daughter (autistic but high functioning)- husband very involved and has already coordinated with his boss to take time off to care for her. HFU scheduled.     LOS: 24 days A FACE TO FACE EVALUATION WAS PERFORMED  KrClide Deutscheraulkar 07/22/2021, 3:32 PM

## 2021-07-22 NOTE — Progress Notes (Signed)
Occupational Therapy Session Note  Patient Details  Name: Erika [name] MRN: [id number] Date of Birth: [birth year]  Today's Date: 07/22/2021 OT Individual Time: 1120-1205 OT Individual Time Calculation (min): 45 min    Short Term Goals: Week 2:  OT Short Term Goal 1 (Week 2): Pt will be able to stand with min A and pull pants over hips with min A. OT Short Term Goal 1 - Progress (Week 2): Met OT Short Term Goal 2 (Week 2): Pt will complete toilet transfers with stand pivot min assist. OT Short Term Goal 2 - Progress (Week 2): Met OT Short Term Goal 3 (Week 2): Pt will donn a pullover shirt with supervision following hemi techniques. OT Short Term Goal 3 - Progress (Week 2): Progressing toward goal Week 3:  OT Short Term Goal 1 (Week 3): STGs=LTGs due to ELOS  Skilled Therapeutic Interventions/Progress Updates:    Pt semi upright in bed, husband present for beginning of session then departed to get lunch.  Pt agreeable to working on neuro re-ed and strengthening of left shoulder.  1:1 NMES applied to anterior and posterior deltoid (lidocaine patch placed over suprispinatus region therefore worked around this).  Also applied to left biceps and triceps on reciprical to promote functional reach task.Manual support provided to grasp cup and bring to mouth then placed back in front of pt.  Verbal cues frequently provided to facilitate increased pt attention to left arm for motor learning.  Ratio 1:3 Rate 35 pps Waveform- Asymmetric Ramp 1.0 Pulse 300 Intensity- 5 Duration -  15 minutes shoulder; 15 minutes upper arm   Report of pain at the beginning of session : none Report of pain at the end of session ; none  No adverse reactions after treatment and is skin intact.    Pt completed supine to sit with mod assist.  Stand pivot EOB to recliner using hemiwalker placed anteriorly with min assist.  Call bell in reach, seat alarm on at end of session.  Therapy  Documentation Precautions:  Precautions Precautions: Fall Precaution Comments: L hemiplegia Restrictions Weight Bearing Restrictions: No LUE Weight Bearing: Weight bearing as tolerated LLE Weight Bearing: Weight bearing as tolerated    Therapy/Group: Individual Therapy  Ezekiel Slocumb 07/22/2021, 4:52 PM

## 2021-07-23 LAB — GLUCOSE, CAPILLARY
Glucose-Capillary: 87 mg/dL (ref 70–99)
Glucose-Capillary: 232 mg/dL — ABNORMAL HIGH (ref 70–99)
Glucose-Capillary: 147 mg/dL — ABNORMAL HIGH (ref 70–99)
Glucose-Capillary: 238 mg/dL — ABNORMAL HIGH (ref 70–99)

## 2021-07-23 NOTE — Plan of Care (Signed)
  Problem: Consults Goal: RH STROKE PATIENT EDUCATION Description: See Patient Education module for education specifics  Outcome: Progressing   Problem: RH BOWEL ELIMINATION Goal: RH STG MANAGE BOWEL WITH ASSISTANCE Description: STG Manage Bowel with mod I Assistance. Outcome: Progressing Goal: RH STG MANAGE BOWEL W/MEDICATION W/ASSISTANCE Description: STG Manage Bowel with Medication with mod i Assistance. Outcome: Progressing   Problem: RH SAFETY Goal: RH STG ADHERE TO SAFETY PRECAUTIONS W/ASSISTANCE/DEVICE Description: STG Adhere to Safety Precautions With cues/reminders Assistance/Device. Outcome: Progressing   Problem: RH COGNITION-NURSING Goal: RH STG USES MEMORY AIDS/STRATEGIES W/ASSIST TO PROBLEM SOLVE Description: STG Uses Memory Aids/Strategies With cues/reminders Assistance to Problem Solve. Outcome: Progressing   Problem: RH PAIN MANAGEMENT Goal: RH STG PAIN MANAGED AT OR BELOW PT'S PAIN GOAL Description: At or below level 4 Outcome: Progressing   Problem: RH KNOWLEDGE DEFICIT Goal: RH STG INCREASE KNOWLEDGE OF DIABETES Description: Patient will be able to manage DM with medications and dietary modifications using handouts and educational tools independently Outcome: Progressing Goal: RH STG INCREASE KNOWLEDGE OF HYPERTENSION Description: Patient will be able to manage HTN with medications and dietary modifications using handouts and educational tools independently Outcome: Progressing Goal: RH STG INCREASE KNOWLEGDE OF HYPERLIPIDEMIA Description: Patient will be able to manage HLD with medications and dietary modifications using handouts and educational tools independently Outcome: Progressing Goal: RH STG INCREASE KNOWLEDGE OF STROKE PROPHYLAXIS Description: Patient will be able to manage secondary stroke risks with medications and dietary modifications using handouts and educational tools independently Outcome: Progressing   Problem: RH KNOWLEDGE  DEFICIT Goal: RH STG INCREASE KNOWLEDGE OF DYSPHAGIA/FLUID INTAKE Description: Patient will be able to manage Dysphagia , medications and dietary modifications using handouts and educational tools independently Outcome: Progressing

## 2021-07-24 LAB — CBC
HCT: 37.8 % (ref 36.0–46.0)
Hemoglobin: 12.7 g/dL (ref 12.0–15.0)
MCH: 30.5 pg (ref 26.0–34.0)
MCHC: 33.6 g/dL (ref 30.0–36.0)
MCV: 90.6 fL (ref 80.0–100.0)
Platelets: 272 10*3/uL (ref 150–400)
RBC: 4.17 MIL/uL (ref 3.87–5.11)
RDW: 13.3 % (ref 11.5–15.5)
WBC: 8.7 10*3/uL (ref 4.0–10.5)
nRBC: 0 % (ref 0.0–0.2)

## 2021-07-24 LAB — BASIC METABOLIC PANEL
Anion gap: 8 (ref 5–15)
BUN: 15 mg/dL (ref 6–20)
CO2: 32 mmol/L (ref 22–32)
Calcium: 10.1 mg/dL (ref 8.9–10.3)
Chloride: 98 mmol/L (ref 98–111)
Creatinine, Ser: 0.83 mg/dL (ref 0.44–1.00)
GFR, Estimated: >60 mL/min (ref >60)
Glucose, Bld: 68 mg/dL — ABNORMAL LOW (ref 70–99)
Potassium: 3.7 mmol/L (ref 3.5–5.1)
Sodium: 138 mmol/L (ref 135–145)

## 2021-07-24 LAB — GLUCOSE, CAPILLARY
Glucose-Capillary: 102 mg/dL — ABNORMAL HIGH (ref 70–99)
Glucose-Capillary: 129 mg/dL — ABNORMAL HIGH (ref 70–99)
Glucose-Capillary: 216 mg/dL — ABNORMAL HIGH (ref 70–99)
Glucose-Capillary: 130 mg/dL — ABNORMAL HIGH (ref 70–99)

## 2021-07-24 NOTE — Progress Notes (Signed)
PROGRESS NOTE   Subjective/Complaints:  Pt reports doing well- unhappy d/c date 8/9- wanted ot leave tomorrow.    ROS:  Pt denies SOB, abd pain, CP, N/V/C/D, and vision changes   Objective:   No results found. Recent Labs    07/24/21 0448  WBC 8.7  HGB 12.7  HCT 37.8  PLT 272    Recent Labs    07/24/21 0448  NA 138  K 3.7  CL 98  CO2 32  GLUCOSE 68*  BUN 15  CREATININE 0.83  CALCIUM 10.1     Intake/Output Summary (Last 24 hours) at 07/24/2021 1040 Last data filed at 07/24/2021 0751 Gross per 24 hour  Intake 816 ml  Output --  Net 816 ml         Physical Exam: Vital Signs Blood pressure 127/64, pulse 68, temperature 98.4 F (36.9 C), temperature source Oral, resp. rate 18, height '5\' 6"'$  (1.676 m), weight 95.4 kg, SpO2 96 %.   General: awake, alert, appropriate, sitting up in w/c; with arm trough on L side; PT in room; NAD HENT: conjugate gaze; oropharynx moist CV: regular rate; no JVD Pulmonary: CTA B/L; no W/R/R- good air movement GI: soft, NT, ND, (+)BS Psychiatric: appropriate; interactive Neurological: LUE MAS of 1+ to 2 in L shoulder/L elbow; 1+ in L hand/wrist.   Neurological:    Mental Status: She is alert and oriented to person, place, and time.    Comments: Left facial weakness with mild dysarthria. Able to answer orientation questions and follow commands without difficulty.  0/5 LUE and trace-1/5 prox to 0/5 distally LLE 4/5 on Right side    Assessment/Plan: 1. Functional deficits which require 3+ hours per day of interdisciplinary therapy in a comprehensive inpatient rehab setting. Physiatrist is providing close team supervision and 24 hour management of active medical problems listed below. Physiatrist and rehab team continue to assess barriers to discharge/monitor patient progress toward functional and medical goals  Care Tool:  Bathing    Body parts bathed by patient:  Abdomen, Chest, Left arm, Right arm, Front perineal area, Right upper leg, Left upper leg, Right lower leg, Left lower leg, Face   Body parts bathed by helper: Buttocks     Bathing assist Assist Level: Minimal Assistance - Patient > 75%     Upper Body Dressing/Undressing Upper body dressing   What is the patient wearing?: Pull over shirt    Upper body assist Assist Level: Minimal Assistance - Patient > 75%    Lower Body Dressing/Undressing Lower body dressing      What is the patient wearing?: Pants     Lower body assist Assist for lower body dressing: Minimal Assistance - Patient > 75%     Toileting Toileting    Toileting assist Assist for toileting: Minimal Assistance - Patient > 75%     Transfers Chair/bed transfer  Transfers assist     Chair/bed transfer assist level: Moderate Assistance - Patient 50 - 74%     Locomotion Ambulation   Ambulation assist      Assist level: Moderate Assistance - Patient 50 - 74% Assistive device: Walker-rolling Max distance: 25   Walk 10 feet activity  Assist     Assist level: Moderate Assistance - Patient - 50 - 74% Assistive device: Walker-rolling   Walk 50 feet activity   Assist Walk 50 feet with 2 turns activity did not occur: Safety/medical concerns  Assist level: Dependent - Patient 0% Assistive device: Lite Gait    Walk 150 feet activity   Assist Walk 150 feet activity did not occur: Safety/medical concerns         Walk 10 feet on uneven surface  activity   Assist Walk 10 feet on uneven surfaces activity did not occur: Safety/medical concerns         Wheelchair     Assist Will patient use wheelchair at discharge?: Yes Type of Wheelchair: Manual    Wheelchair assist level: Supervision/Verbal cueing Max wheelchair distance: 180f    Wheelchair 50 feet with 2 turns activity    Assist        Assist Level: Supervision/Verbal cueing   Wheelchair 150 feet activity      Assist      Assist Level: Supervision/Verbal cueing   Blood pressure 127/64, pulse 68, temperature 98.4 F (36.9 C), temperature source Oral, resp. rate 18, height '5\' 6"'$  (1.676 m), weight 95.4 kg, SpO2 96 %.    Medical Problem List and Plan: 1.  Right pontine stroke             -patient may shower             -ELOS: 8/9 extended due to upgrade of goals to supervision so elderly caregiver can assist  pt forgot about new date , she then recalled the change   -Continue CIR therapies including PT, OT, and SLP - Estim patch to finger and wrist extensors  -grounds pass prn   -Continue CIR- PT, OT and SLP 2.  Impaired mobility: Continue Lovenox             -antiplatelet therapy: Continue ASA/Plavix 3. Cluster headaches and migraines: continue abortive oxygen PRN. Oxycodone prn for HA. Discussed outpatient Botox- patient would like to try. D/c periactin and phenergan as patient not using  8/1- pt reports HA's controlled- con't regimen 4. Mood: Team to provide ego support. LCSW to follow for evaluation and support.             -antipsychotic agents: Lamictal daily.  -asked pt to let [place] if she needs help with her mood/coping.  5. Neuropsych: This patient is capable of making decisions on her own behalf. 6. Skin/Wound Care: Routine pressure relief measures. 7. Fluids/Electrolytes/Nutrition: Monitor I/O. Check lytes in am. 8. HTN: SBP goal 140-180 range to avoid hypoperfusion of brain. Per husband "acts drunk when BP is low" --At goal, continue Imdur, Coreg, aldactone, Demadex,  Vitals:   07/23/21 1946 07/24/21 0507  BP: 134/75 127/64  Pulse: 64 68  Resp: 18 18  Temp: 98.4 F (36.9 C) 98.4 F (36.9 C)  SpO2: 95% 96%  Decrease coreg to 3.'125mg'$  daily- in desired range  8/1- BP well controlled- con't regimen 9. T2DM: Hgb A1C- 10.7. BS run 200's at home. Dietary education/reinforcement.             --Continue Lantus  45 units BID with SSI for elevated BS             Continue  decreased Lantus to 22U BID CBG (last 3)  Recent Labs    07/23/21 1646 07/23/21 2123 07/24/21 0620  GLUCAP 147* 238* 102*   CBG labile mainly after dinner elevation -  pt states she eats peach cobbler for dessert , ask pt not to eat tsome improvement with CBG yesterday pm , had jello and applesauce instead  8/1- Pt's evening CBG is elevated, but otherwise, well controlled 10. CAD s/p CABG: On ASA, coreg, Cozaar, Demadex, ASA, 11. PAD w/claudication: Has been smoking 1 PPD --quit at admission. Continue Nicotine patch. --On lyrica BID to manage symptoms.   12. Aspiration PNA: CT reviewed, appreciate medicine consult,  Resolved off abx may d/c Midline 13. Chronic cluster headaches, currently well controlled: Managed with thorazine as well as oxycodone and high flow oxygen prn. Decreased oxycodone to TID PRN and edited order to indicate it should be given after therapy as per husband's request- makes her too sedated to tolerate therapy. 7/16- added Periactin prn for associated Sx's-con't oxy and O2.    8/1- stopped since wasn't using.  14. H/o depression/anxiety: Continue Lamictal, Prozac with Klonopin prn. 15. Chronic insomnia: Managed with home dose trazodone. 16. Dyslipidemia: Trig- 636, direct LDL-99.6, HDL 29, Chol 202. On Zetia--to start Vespc 17. Vitamin B 12 deficiency: B12- 169 (was WNL a year ago) 18. NASH/Fatty liver: LFTs reviewed and normalized 19. Dense left sided hemiplegia: [place] regarding NMES- started trials Friday. They have TENS unit at home.  20. Hypokalemia: supplement 39mq 7/7 and repeat BMP reviewed, K+ improved to 3.8. Resolved. D/c daily supplement.  21. Leukocytosis: trending upward potentially secondary to aspiration pneumonia  7/18 still some elevation today   -afebrile, no s/s on exam   -monitor serially   -hospitalist following 22. Vitamin D deficiency: continue ergocalciferol 50,000U weekly for 7 weeks.  23.  Hypoxia ,resolved  24. AKI: resolved.  Repeat Creatinine monday 25. Poor perfusion of LLE  7/17- per IM, LLE is colder than RLE_ by a lot- and no DP pulse palpable- Vascular said con't current regimen and monitor closely for low perfusion injury. 7/18 leg feels warmer (by comparative accounts). Normal appearing   -recent ABI 0.46 Consider nitroglycerin patch to improve blood flow, but may lower BP and worsen headaches.  26. Left post-stroke shoulder pain: d/c lidocaine patch and ordered voltaren gel. Apply kinesiology tape. Use brace as needed.  282 S/p fall- lac to left knee, Head CT negative- reviewed with patient and husband. Please use [employer] for all transfers.  28. Hypokalemia: supplement 211m on 7/25, monitor K+ weekly. Repeat ordered for Monday.  8/1- K+ 3.7- cont to monitor 29. Spasticity 8/1- pt developing spasticity of LUE in spite of beginning flaccidity- don't think needs Baclofen as of yet - suggest ROM 2-3x/day esp of L shoulder and elbow.  30. Disposition: Lives with husband and daughter (autistic but high functioning)- husband very involved and has already coordinated with his boss to take time off to care for her. HFU scheduled.     LOS: 26 days A FACE TO FACE EVALUATION WAS PERFORMED  Erika [name] 07/24/2021, 10:40 AM

## 2021-07-24 NOTE — Progress Notes (Signed)
Physical Therapy Session Note  Patient Details  Name: Erika [name] MRN: [id number] Date of Birth: 1971/08/04  Today's Date: 07/24/2021 PT Individual Time: 0800-0900 PT Individual Time Calculation (min): 60 min   Short Term Goals: Week 4:  PT Short Term Goal 1 (Week 4): Family training/education will be incorporated in therapy sessions to prepare for discharge and reduce caregiver burden PT Short Term Goal 2 (Week 4): Pt will complete sit<>stand transfers with CGA and LRAD PT Short Term Goal 3 (Week 4): Pt will consistently complete squat<>pivot transfers with minA and LRAD PT Short Term Goal 4 (Week 4): Pt will ambulate 68ft with minA and LRAD  Skilled Therapeutic Interventions/Progress Updates:     Pt received sitting in w/c to start session. Denies any pain. MD arriving shortly after for morning rounds. Donned socks and shoes with L AFO with totalA for time. Wheeled to main rehab gym for time as well. Sit<>stand to [facility] with minA with mod cues needed for placement (totalA needed for L foot placement). Ambulated ~16ft +38ft + 89ft (seated rest breaks) with min/modA and HW with +2 assist for w/c follow - gait with extremely narrow BOS, poor L foot clearance with limited hip/knee flexors, and difficulty managing HW to keep laterally enough to allow foot clearance for her R foot.   Stand<>pivot (pt self selected transfer type) with min/modA from w/c to mat table, needed assist for problem solving and technique. Worked on repeated sit<>stands, 2x5, with supervision (!!) and then worked on lateral stepping L<>R (24ft + 60ft + 63ft + 48ft) with modA and L knee block for stability during stance. She's able to abduct LLE for lateral stepping but has increased difficulty stepping R due to limited LLE weight shifting.  Squat<>pivot with modA back to her w/c and she was returned to her room where she remained seated in w/c with safety belt alarm on, LUE in trough, all needs met.  Therapy  Documentation Precautions:  Precautions Precautions: Fall Precaution Comments: L hemiplegia Restrictions Weight Bearing Restrictions: No LUE Weight Bearing: Weight bearing as tolerated LLE Weight Bearing: Weight bearing as tolerated General:     Therapy/Group: Individual Therapy  [name] 07/24/2021, 7:38 AM

## 2021-07-24 NOTE — Progress Notes (Signed)
Occupational Therapy Weekly Progress Note  Patient Details  Name: Erika [name] MRN: [id number] Date of Birth: July 14, 1971  Beginning of progress report period: July 17, 2021 End of progress report period: July 24, 2021  Today's Date: 07/24/2021 OT Individual Time: 1045-1200 OT Individual Time Calculation (min): 75 min    Last week, pt's STGs were the same as her LTGs as ELOS was 07/25/21. Pt was extended by one week to increase her level of function and have her at a higher skill level before going home.   Patient continues to demonstrate the following deficits: decreased cardiorespiratoy endurance, abnormal tone, unbalanced muscle activation, and decreased coordination, decreased attention to left, decreased memory, and decreased standing balance, hemiplegia, and decreased balance strategies and therefore will continue to benefit from skilled OT intervention to enhance overall performance with BADL.  Patient progressing toward long term goals..  Continue plan of care.  OT Short Term Goals Week 3:  OT Short Term Goal 1 (Week 3): STGs=LTGs due to ELOS OT Short Term Goal 1 - Progress (Week 3): Progressing toward goal (Pt's LOS extended by one week.) Week 4:  OT Short Term Goal 1 (Week 4): STGs = LTGs  Skilled Therapeutic Interventions/Progress Updates:    Pt received in wc ready for therapy. Transported to gym and pt completed transfer to mat with min a.   Focus of session on LUE NMR with use of estim, hand over hand guiding, a/arom. Scapular elev, depression and retractions with 25% AROM.   Estim applied to L forearm for wrist and finger ext while pt engaged in tasks with hand over hand A of grasp and release of cones, pushing and pull objects with table slides.  Guiding faciliation of arm with push pull using bench stool, and dowel bar.  Pt needs frequent cues to fully attend to L arm and engage in visualization of L arm moving. Unfortunately, no active movement seen elsewhere in  arm besides her scapula and trace in deltoids.   Pt returned to room and resting in wc with belt alarm on and all needs met.  Therapy Documentation Precautions:  Precautions Precautions: Fall Precaution Comments: L hemiplegia Restrictions Weight Bearing Restrictions: No LUE Weight Bearing: Weight bearing as tolerated LLE Weight Bearing: Weight bearing as tolerated   Pain: Pain Assessment Pain Scale: 0-10 Pain Score: 7  Faces Pain Scale: No hurt Pain Type: Acute pain Pain Location: Head ADL: ADL Eating: Set up Grooming: Setup Upper Body Bathing: Contact guard Where Assessed-Upper Body Bathing: Shower Lower Body Bathing: Minimal assistance Where Assessed-Lower Body Bathing: Shower Upper Body Dressing: Minimal assistance Lower Body Dressing: Moderate assistance Toileting: Moderate assistance Where Assessed-Toileting: Bedside Commode Toilet Transfer: Minimal assistance Toilet Transfer Method: Squat pivot Toilet Transfer Equipment: Drop arm bedside commode Social research officer, government: Minimal assistance Social research officer, government Method: Education officer, environmental: Radio broadcast assistant, Grab bars   Therapy/Group: Individual Therapy  [place] 07/24/2021, 10:03 AM

## 2021-07-24 NOTE — Progress Notes (Signed)
Speech Language Pathology Daily Session Note  Patient Details  Name: LIDIE JARAMILLO MRN: [id number] Date of Birth: 1971/07/03  Today's Date: 07/24/2021 SLP Individual Time: 1415-1505 SLP Individual Time Calculation (min): 50 min  Short Term Goals: Week 4: SLP Short Term Goal 1 (Week 4): Patient will perform mildly complex problem solving and reasoning tasks with minA cues for accuracy. SLP Short Term Goal 2 (Week 4): Patient will demonstrate alternating attention during basic to mildly complex functional tasks at supervision to minA level. SLP Short Term Goal 3 (Week 4): Patient will tolerate regular texture solids, thin liquids during meals with intermittent A for swallow safety. SLP Short Term Goal 4 (Week 4): Patient will demonstrate anticipatory awareness during functional tasks and planning for future discharge home, with minA.  Skilled Therapeutic Interventions: Skilled treatment session focused on cognitive goals. SLP facilitated session by providing supervision level verbal cues for functional problem solving during a complex medication management task. Patient required Min verbal cues to alternate her attention between task and functional conversation for ~10 minutes. Patient requested to use the commode and was transferred via the [facility]. Patient was continent of bowel and bladder. Patient handed off to NT. Continue with current plan of care.      Pain No/Denies Pain   Therapy/Group: Individual Therapy  Brannen Koppen 07/24/2021, 3:13 PM

## 2021-07-25 LAB — GLUCOSE, CAPILLARY
Glucose-Capillary: 78 mg/dL (ref 70–99)
Glucose-Capillary: 83 mg/dL (ref 70–99)
Glucose-Capillary: 256 mg/dL — ABNORMAL HIGH (ref 70–99)
Glucose-Capillary: 118 mg/dL — ABNORMAL HIGH (ref 70–99)
Glucose-Capillary: 133 mg/dL — ABNORMAL HIGH (ref 70–99)

## 2021-07-25 MED ORDER — POTASSIUM CHLORIDE 20 MEQ PO PACK
20.0000 meq | PACK | Freq: Every day | ORAL | Status: DC
  Administered 2021-07-25 – 2021-07-31 (×7): 20 meq via ORAL
  Filled 2021-07-25 (×8): qty 1

## 2021-07-25 NOTE — Progress Notes (Signed)
PROGRESS NOTE   Subjective/Complaints: Team conference today Patient has no complaints Labs stable this week.    ROS:  Pt denies SOB, abd pain, CP, N/V/C/D, and vision changes   Objective:   No results found. Recent Labs    07/24/21 0448  WBC 8.7  HGB 12.7  HCT 37.8  PLT 272    Recent Labs    07/24/21 0448  NA 138  K 3.7  CL 98  CO2 32  GLUCOSE 68*  BUN 15  CREATININE 0.83  CALCIUM 10.1     Intake/Output Summary (Last 24 hours) at 07/25/2021 1119 Last data filed at 07/25/2021 0815 Gross per 24 hour  Intake 840 ml  Output --  Net 840 ml         Physical Exam: Vital Signs Blood pressure (!) 154/62, pulse 63, temperature 97.6 F (36.4 C), temperature source Oral, resp. rate 18, height '5\' 6"'$  (1.676 m), weight 97.8 kg, SpO2 96 %.  Gen: no distress, normal appearing HEENT: oral mucosa pink and moist, NCAT Cardio: Reg rate Chest: normal effort, normal rate of breathing Abd: soft, non-distended Ext: no edema Psych: pleasant, normal affect Skin: intact Neurological: LUE MAS of 1+ to 2 in L shoulder/L elbow; 1+ in L hand/wrist.   Neurological:    Mental Status: She is alert and oriented to person, place, and time.    Comments: Left facial weakness with mild dysarthria. Able to answer orientation questions and follow commands without difficulty.  0/5 LUE and trace-1/5 prox to 0/5 distally LLE 4/5 on Right side    Assessment/Plan: 1. Functional deficits which require 3+ hours per day of interdisciplinary therapy in a comprehensive inpatient rehab setting. Physiatrist is providing close team supervision and 24 hour management of active medical problems listed below. Physiatrist and rehab team continue to assess barriers to discharge/monitor patient progress toward functional and medical goals  Care Tool:  Bathing    Body parts bathed by patient: Abdomen, Chest, Left arm, Right arm, Front perineal  area, Right upper leg, Left upper leg, Right lower leg, Left lower leg, Face   Body parts bathed by helper: Buttocks     Bathing assist Assist Level: Minimal Assistance - Patient > 75%     Upper Body Dressing/Undressing Upper body dressing   What is the patient wearing?: Pull over shirt    Upper body assist Assist Level: Minimal Assistance - Patient > 75%    Lower Body Dressing/Undressing Lower body dressing      What is the patient wearing?: Pants     Lower body assist Assist for lower body dressing: Minimal Assistance - Patient > 75%     Toileting Toileting    Toileting assist Assist for toileting: Minimal Assistance - Patient > 75%     Transfers Chair/bed transfer  Transfers assist     Chair/bed transfer assist level: Moderate Assistance - Patient 50 - 74%     Locomotion Ambulation   Ambulation assist      Assist level: Moderate Assistance - Patient 50 - 74% Assistive device: Walker-rolling Max distance: 25   Walk 10 feet activity   Assist     Assist level: Moderate Assistance -  Patient - 50 - 74% Assistive device: Walker-rolling   Walk 50 feet activity   Assist Walk 50 feet with 2 turns activity did not occur: Safety/medical concerns  Assist level: Dependent - Patient 0% Assistive device: Lite Gait    Walk 150 feet activity   Assist Walk 150 feet activity did not occur: Safety/medical concerns         Walk 10 feet on uneven surface  activity   Assist Walk 10 feet on uneven surfaces activity did not occur: Safety/medical concerns         Wheelchair     Assist Will patient use wheelchair at discharge?: Yes Type of Wheelchair: Manual    Wheelchair assist level: Supervision/Verbal cueing Max wheelchair distance: 143f    Wheelchair 50 feet with 2 turns activity    Assist        Assist Level: Supervision/Verbal cueing   Wheelchair 150 feet activity     Assist      Assist Level: Supervision/Verbal  cueing   Blood pressure (!) 154/62, pulse 63, temperature 97.6 F (36.4 C), temperature source Oral, resp. rate 18, height '5\' 6"'$  (1.676 m), weight 97.8 kg, SpO2 96 %.    Medical Problem List and Plan: 1.  Right pontine stroke             -patient may shower             -ELOS: 8/9 extended due to upgrade of goals to supervision so elderly caregiver can assist  pt forgot about new date , she then recalled the change   -Continue CIR therapies including PT, OT, and SLP - Estim patch to finger and wrist extensors  -grounds pass prn   -Continue CIR- PT, OT and SLP 2.  Impaired mobility: Continue Lovenox             -antiplatelet therapy: Continue ASA/Plavix 3. Cluster headaches and migraines: continue abortive oxygen PRN. Oxycodone prn for HA. Discussed outpatient Botox- patient would like to try. D/c periactin and phenergan as patient not using  8/1- pt reports HA's controlled- con't regimen 4. Mood: Team to provide ego support. LCSW to follow for evaluation and support.             -antipsychotic agents: Lamictal daily.  -asked pt to let [place]now if she needs help with her mood/coping.  5. Neuropsych: This patient is capable of making decisions on her own behalf. 6. Skin/Wound Care: Routine pressure relief measures. 7. Fluids/Electrolytes/Nutrition: Monitor I/O. Check lytes in am. 8. HTN: SBP goal 140-180 range to avoid hypoperfusion of brain. Per husband "acts drunk when BP is low" --At goal, continue Imdur, Coreg, aldactone, Demadex,  Vitals:   07/24/21 2002 07/25/21 0620  BP: 139/68 (!) 154/62  Pulse: 61 63  Resp: 18 18  Temp: 97.7 F (36.5 C) 97.6 F (36.4 C)  SpO2: 97% 96%  Decrease coreg to 3.'125mg'$  daily- in desired range  8/1- BP well controlled- con't regimen 9. T2DM: Hgb A1C- 10.7. BS run 200's at home. Dietary education/reinforcement.             --Continue Lantus  45 units BID with SSI for elevated BS             Continue decreased Lantus to 22U BID CBG (last 3)  Recent  Labs    07/24/21 1655 07/24/21 2136 07/25/21 0618  GLUCAP 216* 130* 78   CBG labile mainly after dinner elevation - pt states she eats peach cobbler for dessert ,  ask pt not to eat tsome improvement with CBG yesterday pm , had jello and applesauce instead  8/1- Pt's evening CBG is elevated, but otherwise, well controlled 10. CAD s/p CABG: On ASA, coreg, Cozaar, Demadex, ASA, 11. PAD w/claudication: Has been smoking 1 PPD --quit at admission. Continue Nicotine patch. --On lyrica BID to manage symptoms.   12. Aspiration PNA: CT reviewed, appreciate medicine consult,  Resolved off abx may d/c Midline 13. Chronic cluster headaches, currently well controlled: Managed with thorazine as well as oxycodone and high flow oxygen prn. Decreased oxycodone to TID PRN and edited order to indicate it should be given after therapy as per husband's request- makes her too sedated to tolerate therapy. 7/16- added Periactin prn for associated Sx's-con't oxy and O2.    8/1- stopped since wasn't using.  14. H/o depression/anxiety: Continue Lamictal, Prozac with Klonopin prn. 15. Chronic insomnia: Managed with home dose trazodone. 16. Dyslipidemia: Trig- 636, direct LDL-99.6, HDL 29, Chol 202. On Zetia--to start Vespc 17. Vitamin B 12 deficiency: B12- 169 (was WNL a year ago) 18. NASH/Fatty liver: LFTs reviewed and normalized 19. Dense left sided hemiplegia: [place] regarding NMES- started trials Friday. They have TENS unit at home.  20. Hypokalemia: supplement 86mq 7/7 and repeat BMP reviewed, K+ improved to 3.8. Resolved. D/c daily supplement.  21. Leukocytosis: trending upward potentially secondary to aspiration pneumonia  7/18 still some elevation today   -afebrile, no s/s on exam   -monitor serially   -hospitalist following 22. Vitamin D deficiency: continue ergocalciferol 50,000U weekly for 7 weeks.  23.  Hypoxia ,resolved  24. AKI: resolved. Repeat Creatinine monday 25. Poor perfusion of  LLE  7/17- per IM, LLE is colder than RLE_ by a lot- and no DP pulse palpable- Vascular said con't current regimen and monitor closely for low perfusion injury. 7/18 leg feels warmer (by comparative accounts). Normal appearing   -recent ABI 0.46 Consider nitroglycerin patch to improve blood flow, but may lower BP and worsen headaches.  Can consider outpatient Qutenza patch for her pain from claudication 26. Left post-stroke shoulder pain: d/c lidocaine patch and ordered voltaren gel. Apply kinesiology tape. Use brace as needed.  237 S/p fall- lac to left knee, Head CT negative- reviewed with patient and husband. Please use Stedy for all transfers.  28. Hypokalemia: start daily 266m supplement 29. Spasticity: patient developing spasticity of LUE in spite of beginning flaccidity- don't think needs Baclofen as of yet - suggest ROM 2-3x/day esp of L shoulder and elbow.  30. Disposition: Lives with husband and daughter (autistic but high functioning)- husband very involved and has already coordinated with his boss to take time off to care for her. HFU scheduled. Daughter will need some education-scheduling.     LOS: 27 days A FACE TO FACE EVALUATION WAS PERFORMED  Jaxten Brosh P Nataliee Shurtz 07/25/2021, 11:19 AM

## 2021-07-25 NOTE — Progress Notes (Signed)
Occupational Therapy Session Note  Patient Details  Name: Erika [name] MRN: [id number] Date of Birth: 08/03/1971  Today's Date: 07/25/2021 OT Individual Time: 1000-1100 OT Individual Time Calculation (min): 60 min    Short Term Goals: Week 4:  OT Short Term Goal 1 (Week 4): STGs = LTGs  Skilled Therapeutic Interventions/Progress Updates:    Pt received in wc requesting a shower. Pt eager to try to ambulate to shower.  Using hemiwalker in R hand, L hand placed in her pants pocket for support.  Pt able to ambulate slowly and cautiously to bend with min A.  At last step to bench, pt felt her leg was giving out.  So quickly swiveled to bench.  Used squat/st pivot to wc to come out of shower.  See ADL documentation below for details. Continues to require cues for techniques but gradual progress with memory.    Obtained a Give Mohr sling for PT to trial with pt during ambulation. Otherwise pt could use her L pant pocket to support her arm.  Hand off to PT and RT for next session.   Therapy Documentation Precautions:  Precautions Precautions: Fall Precaution Comments: L hemiplegia Restrictions Weight Bearing Restrictions: No LUE Weight Bearing: Weight bearing as tolerated LLE Weight Bearing: Weight bearing as tolerated     Pain: Pain Assessment Pain Score: 0-No pain ADL: ADL Eating: Set up Grooming: Setup Upper Body Bathing: Contact guard Where Assessed-Upper Body Bathing: Shower Lower Body Bathing: Contact guard Where Assessed-Lower Body Bathing: Shower Upper Body Dressing: Setup, Minimal cueing Lower Body Dressing: Moderate assistance Toileting: Moderate assistance Where Assessed-Toileting: Bedside Commode Toilet Transfer: Minimal assistance Toilet Transfer Method: Squat pivot Toilet Transfer Equipment: Drop arm bedside commode Social research officer, government: Minimal assistance Social research officer, government Method: Stand pivot, Ambulating (ambulated to shower, sq pivot out to  wc) [employer]: Radio broadcast assistant, Grab bars   Therapy/Group: Individual Therapy  [place] 07/25/2021, 11:11 AM

## 2021-07-25 NOTE — Plan of Care (Signed)
  Problem: RH Functional Use of Upper Extremity Goal: LTG Patient will use RT/LT upper extremity as a (OT) Description: LTG: Patient will use right/left upper extremity as a stabilizer/gross assist/diminished/nondominant/dominant level with assist, with/without cues during functional activity (OT) Outcome: Not Met (No progress in active movement of LUE; with exception of trace contraction in shoulder; 0/5 distally to this) Flowsheets (Taken 07/25/2021 1253) LTG: Pt will use upper extremity in functional activity with assistance level of: Dependent - Patient equals 0%

## 2021-07-25 NOTE — Progress Notes (Signed)
Recreational Therapy Session Note  Patient Details  Name: Erika [name] MRN: [id number] Date of Birth: 1970-12-27 Today's Date: 07/25/2021  Pain: no c/o Skilled Therapeutic Interventions/Progress Updates: Session focused on safety awareness, dynamic standing balance during cotreat with PT.  Pt ambulated ~25 feet x2, 1 walk with [facility] with mod assist and then hemi-walker with min assist, mod instructional cues.  Pt also using Give Mohr sling on LUE during ambulation.  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  Pt with questions about ambulating with nursing staff and family during remainder of hospitalization and then at home.  PT instructed pt that she could only be walking with therapy staff throughout her LOS and once home due to high fall risk.  Reviewed recommendations for follow up therapies and they would make further recommendations for ambulating with family when appropriate.  Pt stated understanding.  Therapy/Group: Co-Treatment   Dominque Marlin 07/25/2021, 3:15 PM

## 2021-07-25 NOTE — Progress Notes (Signed)
Physical Therapy Session Note  Patient Details  Name: Erika [name] MRN: [id number] Date of Birth: Nov 07, 1971  Today's Date: 07/25/2021 PT Individual Time: 1100-1158 PT Individual Time Calculation (min): 58 min   Short Term Goals: Week 4:  PT Short Term Goal 1 (Week 4): Family training/education will be incorporated in therapy sessions to prepare for discharge and reduce caregiver burden PT Short Term Goal 2 (Week 4): Pt will complete sit<>stand transfers with CGA and LRAD PT Short Term Goal 3 (Week 4): Pt will consistently complete squat<>pivot transfers with minA and LRAD PT Short Term Goal 4 (Week 4): Pt will ambulate 82f with minA and LRAD  Skilled Therapeutic Interventions/Progress Updates:     Pt being wheeled in w/c by TR at start session. TR present throughout session. Pt denies any pain. Donned Give Mohr sling to LUE during gait training to encourage LUE approximation at the shoulder joint. Focused majority of session on functional gait training with HW vs QC. 1st gait trial ambulated ~279fwith modA and QC on R side, 2nd gait trial ~3518fith minA and HW. With QC, she demo's significant increase in instability and difficult with managing balance while advancing QC. Multiple LOB's during QC trial requiring modA for correction. In contrast, she demonstrated improved balance with HW and ability to confidently shift her weight laterally. Next, worked on static standing balance while playing "shooting range" game with Wii balance. 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 She was returned to her room at end of session and remained seated in w/c with safety belt alarm on and all needs in reach. Pt asking when he her husband can walk with her in the hallways and reminded her that due to her significant high falls risk with functional gait, that this is not allowed at this time - she voiced understanding but will benefit from  further education on fall prevention and safety awareness.   Therapy Documentation Precautions:  Precautions Precautions: Fall Precaution Comments: L hemiplegia Restrictions Weight Bearing Restrictions: No LUE Weight Bearing: Weight bearing as tolerated LLE Weight Bearing: Weight bearing as tolerated General:     Therapy/Group: Individual Therapy  ChrAlger Simons2/2022, 7:29 AM

## 2021-07-25 NOTE — Progress Notes (Signed)
Occupational Therapy Session Note  Patient Details  Name: Erika [name] MRN: [id number] Date of Birth: 05-18-1971  Today's Date: 07/25/2021 OT Individual Time: 1330-1430 OT Individual Time Calculation (min): 60 min    Short Term Goals: Week 4:  OT Short Term Goal 1 (Week 4): STGs = LTGs  Skilled Therapeutic Interventions/Progress Updates:    Pt sitting up in w/c with husband present. Pts husband reports it is confirmed that they are staying in current house.  Also reports current house has a bathroom with a bath shower combo with grab bars and that they own a shower bench, however this bathroom is not w/c accessible.   OT session focused on caregiver education and blocked practice training on functional transfers including stand pivot and squat pivot w/c<>3in1commode and tub bench transfer over tub/shower combination.  Pt's husband required moderate intermittent Vcs for body mechanics, safe placement of w/c, and also intermittent CGA to ensure safe completion of transfer.  Able to fade to occasional Vcs during squat pivot (to encourage husband in providing appropriate cues to pt for prevention of pt impulsivity) and min Vcs (for safe blocking of pts left knee) during stand pivot toilet transfers.   Transported pt to adl bathroom via w/c for safety and time management.  Provided visual demonstration of pt's needed assist  of mod assist including manual placement of LLE and max Vcs during ambulation level transfer to shower bench using hemi walker.  Pts husband in agreement with therapist that current level of assist required is not safe for him to attempt at this time.  Transported pt back to room, call bell in reach, seat alarm on.  Therapy Documentation Precautions:  Precautions Precautions: Fall Precaution Comments: L hemiplegia Restrictions Weight Bearing Restrictions: No LUE Weight Bearing: Weight bearing as tolerated LLE Weight Bearing: Weight bearing as  tolerated    Therapy/Group: Individual Therapy  Ezekiel Slocumb 07/25/2021, 12:52 PM

## 2021-07-25 NOTE — Progress Notes (Signed)
Speech Language Pathology Daily Session Note  Patient Details  Name: Erika [name] MRN: [id number] Date of Birth: 1971-07-07  Today's Date: 07/25/2021 SLP Individual Time: 1430-1500 SLP Individual Time Calculation (min): 30 min  Short Term Goals: Week 4: SLP Short Term Goal 1 (Week 4): Patient will perform mildly complex problem solving and reasoning tasks with minA cues for accuracy. SLP Short Term Goal 2 (Week 4): Patient will demonstrate alternating attention during basic to mildly complex functional tasks at supervision to minA level. SLP Short Term Goal 3 (Week 4): Patient will tolerate regular texture solids, thin liquids during meals with intermittent A for swallow safety. SLP Short Term Goal 4 (Week 4): Patient will demonstrate anticipatory awareness during functional tasks and planning for future discharge home, with minA.  Skilled Therapeutic Interventions: Skilled treatment session focused on communication goals and ongoing education with the patient and her husband. SLP facilitated session by providing supervision-Min A verbal cues for use of a decreased speech rate to maximize speech intelligibility at the sentence level. Patient's husband asking questions regarding activities to stay cognitively engaged at home. SLP provided examples of functional tasks to do at home to maximize cognitive functioning and overall functional independence. Both verbalized understanding and agreement. Patient left upright in wheelchair with alarm on and all needs within reach. Continue with current plan of care.      Pain No/Denies Pain   Therapy/Group: Individual Therapy  Mekhi Sonn 07/25/2021, 3:30 PM

## 2021-07-25 NOTE — Patient Care Conference (Signed)
Inpatient RehabilitationTeam Conference and Plan of Care Update Date: 07/25/2021   Time: 13:14 PM    Patient Name: Erika [name]      Medical Record Number: [id number]  Date of Birth: [birth year] Sex: Female         Room/Bed: 4W19C/4W19C-01 Payor Info: Payor: MEDICARE / Plan: MEDICARE PART A / Product Type: *No Product type* /    Admit Date/Time:  06/28/2021  1:00 PM  Primary Diagnosis:  Right pontine stroke [facility])  Hospital Problems: Principal Problem:   Right pontine stroke [facility])    Expected Discharge Date: Expected Discharge Date: 08/01/21  Team Members Present: Physician leading conference: Dr. Leeroy Cha Social Worker Present: Ovidio Kin, LCSW Nurse Present: Dorien Chihuahua, RN PT Present: Ginnie Smart, PT OT Present: Leretha Pol, OT SLP Present: Charolett Bumpers, SLP PPS Coordinator present : Gunnar Fusi, SLP     Current Status/Progress Goal Weekly Team Focus  Bowel/Bladder   continent bladder and bowel; in and out cath x 1 on 7/31, occasional double void to empty bladder, LBM 8/1  regular bladder emptying  timed toileting and check for PVR   Swallow/Nutrition/ Hydration   Regular textures with thin liquids, intermittent supervision  Mod I  use of swallow strategies, tolerance of current diet   ADL's   min A overall, minimal changes in LUE  contact guard  ADLs, LUE NMR with estim, pt/fam ed to prepare for discharge   Mobility   min/modA bed mobility, minA squat<>pivot transfers (transfers fluctuate). minA gait ~9f with HW  supervision to min assist  AFO reconsult, functional transfers, gait training with LRAD ([facility]), LLE NMR, DC planning, family ed/training   Communication   Min A  Supervision  use of speech intelligibility strategies, self-monitoring and correcting errors   Safety/Cognition/ Behavioral Observations  Supervision-Min A  Supervision-Min A  mildly complex problem solving, safety awareness, recall and selective  attention   Pain   complains of headache, PRN oxycodone  pain<3/10  headache controlled by PRN pain med   Skin   ecchymosis abdomen, skin intact  no skin breakdown  assess skin Q shift, assist in repositioning     Discharge Planning:  Will need to begin family education with husband who is here daily and may need to have daughter come in also, since times husband will need to leave for work meetings   Team Discussion: MD adjusting medications, Asp pna was treated. MD recommend OP Botox for HA. Patient on target to meet rehab goals: yes, currently requires min assist for ADLs and min - mod assist for bed mobility. Transfers are better; mod - max assist with cues for set up. Able to ambulate up to 25' with a hemi walker with min - mod assist. Requires min assist for cognition, selective attention, memory and on a regular consistency diet.  *See Care Plan and progress notes for long and short-term goals.   Revisions to Treatment Plan:  Added sling for shoulder protection Practicing with DME   Teaching Needs: Safety, medications, secondary stroke risk management, transfers, toileting, etc  Current Barriers to Discharge: Decreased caregiver support and Home enviroment access/layout  Possible Resolutions to Barriers: Family education Spouse working on ramp for entry OP follow up recommended     Medical Summary Current Status: left upper extremity spasticity, shoulder pain, anxiety, depression, aspiration pneumonia, type 2 DM, chronic cluster headaches  Barriers to Discharge: Medical stability  Barriers to Discharge Comments: left upper extremity spasticity, shoulder pain, anxiety, depression,  aspiration pneumonia, type 2 DM, chronic cluster headaches Possible Resolutions to [employer]: continue ROM to LUE, kinesiology tape over left shoulder, continue medications for anxiety and depression and minimizing sedating medications, continue lantus   Continued Need for Acute  Rehabilitation Level of Care: The patient requires daily medical management by a physician with specialized training in physical medicine and rehabilitation for the following reasons: Direction of a multidisciplinary physical rehabilitation program to maximize functional independence : Yes Medical management of patient stability for increased activity during participation in an intensive rehabilitation regime.: Yes Analysis of laboratory values and/or radiology reports with any subsequent need for medication adjustment and/or medical intervention. : Yes   I attest that I was present, lead the team conference, and concur with the assessment and plan of the team.   Dorien Chihuahua B 07/25/2021, 3:39 PM

## 2021-07-26 ENCOUNTER — Telehealth: Payer: Self-pay | Admitting: Pain Medicine

## 2021-07-26 LAB — GLUCOSE, CAPILLARY
Glucose-Capillary: 102 mg/dL — ABNORMAL HIGH (ref 70–99)
Glucose-Capillary: 135 mg/dL — ABNORMAL HIGH (ref 70–99)
Glucose-Capillary: 109 mg/dL — ABNORMAL HIGH (ref 70–99)
Glucose-Capillary: 169 mg/dL — ABNORMAL HIGH (ref 70–99)

## 2021-07-26 NOTE — Progress Notes (Signed)
Occupational Therapy Session Note  Patient Details  Name: Erika [name] MRN: [id number] Date of Birth: Jun 14, 1971  Today's Date: 07/26/2021 OT Individual Time: 1300-1330 OT Individual Time Calculation (min): 30 min    Short Term Goals:  Week 4:  OT Short Term Goal 1 (Week 4): STGs = LTGs  Skilled Therapeutic Interventions/Progress Updates:    Pt sitting up in w/c, mother in law and daughter present for caregiver education.  Pt with no c/o pain.  Daughter worried she might hurt her mom when transferring her and needing a lot of encouragement throughout to reduce anxiety.    OT session focused on squat pivot transfer training w/c <>bedside commode providing daughter and mother in law hands on practice under supervision of this therapist.  Provided visual demonstration on technique, body mechanics, and DME management initially.  Daughter and mother in law each assisted pt in squat pivot transfer w/c<>bedside commode x 1 trial.  Mother in law initially needing step by step instruction on dropping of w/c and BSC arms, but demonstrated good technique during squat pivot with pt.  Pts daughter required min assist to successfully transfer her mom w/c<>BSC due to daughter having difficulty achieving squat position without losing balance herself.    Pts daughter will benefit from further training to assist her mother in squat pivots transfers to ensure safety and fall prevention. Call bell in reach, seat alarm on at end of session.  Therapy Documentation Precautions:  Precautions Precautions: Fall Precaution Comments: L hemiplegia Restrictions Weight Bearing Restrictions: No LUE Weight Bearing: Weight bearing as tolerated LLE Weight Bearing: Weight bearing as tolerated    Therapy/Group: Individual Therapy  Ezekiel Slocumb 07/26/2021, 9:00 AM

## 2021-07-26 NOTE — Progress Notes (Signed)
Patient slept well throughout the night. Incontinent episode of urine this am. PVR-113m. PRN oxy given x1 for c/o headache-effective. Refused bowel meds at hs.

## 2021-07-26 NOTE — Progress Notes (Signed)
Patient ID: Erika [name], female   DOB: 06-26-1971, 50 y.o.   MRN: [id number] Met with pt, daughter and mother in-law who are present for therapies and also spoke with husband via telephone to discuss team conference progress, equipment and follow up. Mother in-law and daughter are here for education and may come back tomorrow and again next week to feel comfortable with pt';s care. Husband will be here Monday to finish any education needed  but also building ramp this weekend for pt. All in agreement with OP therapies and order faxed to [facility] for OP will contact husband to arrange appointments. Will order equipment and made aware hemi-walker is probably not covered. Mother in-law to look for one at [facility] [name] and [facility]. Will work toward discharge Tuesday 8/9.

## 2021-07-26 NOTE — Plan of Care (Signed)
  Problem: RH Stairs Goal: LTG Patient will ambulate up and down stairs w/assist (PT) Description: LTG: Patient will ambulate up and down # of stairs with assistance (PT) Outcome: Not Applicable Note: DC stair goal - husband building ramp for safe entrance into home   Problem: RH Balance Goal: LTG Patient will maintain dynamic sitting balance (PT) Description: LTG:  Patient will maintain dynamic sitting balance with assistance during mobility activities (PT) Flowsheets (Taken 07/26/2021 0737) LTG: Pt will maintain dynamic sitting balance during mobility activities with:: Contact Guard/Touching assist Note: Downgraded due to slower than anticipated progress Goal: LTG Patient will maintain dynamic standing balance (PT) Description: LTG:  Patient will maintain dynamic standing balance with assistance during mobility activities (PT) Flowsheets (Taken 07/26/2021 0737) LTG: Pt will maintain dynamic standing balance during mobility activities with:: Minimal Assistance - Patient > 75% Note: Downgraded due to slower than anticipated progress   Problem: Sit to Stand Goal: LTG:  Patient will perform sit to stand with assistance level (PT) Description: LTG:  Patient will perform sit to stand with assistance level (PT) Flowsheets (Taken 07/26/2021 0737) LTG: PT will perform sit to stand in preparation for functional mobility with assistance level: Contact Guard/Touching assist Note: Downgraded due to slower than anticipated progress   Problem: RH Bed Mobility Goal: LTG Patient will perform bed mobility with assist (PT) Description: LTG: Patient will perform bed mobility with assistance, with/without cues (PT). Flowsheets (Taken 07/26/2021 0737) LTG: Pt will perform bed mobility with assistance level of: Minimal Assistance - Patient > 75% Note: Downgraded due to slower than anticipated progress   Problem: RH Bed to Chair Transfers Goal: LTG Patient will perform bed/chair transfers w/assist  (PT) Description: LTG: Patient will perform bed to chair transfers with assistance (PT). Flowsheets (Taken 07/26/2021 0737) LTG: Pt will perform Bed to Chair Transfers with assistance level: Minimal Assistance - Patient > 75% Note: Downgraded due to slower than anticipated progress   Problem: RH Ambulation Goal: LTG Patient will ambulate in controlled environment (PT) Description: LTG: Patient will ambulate in a controlled environment, # of feet with assistance (PT). Flowsheets (Taken 07/26/2021 0737) LTG: Pt will ambulate in controlled environ  assist needed:: Minimal Assistance - Patient > 75% LTG: Ambulation distance in controlled environment: 71' Note: Downgraded due to slower than anticipated progress Goal: LTG Patient will ambulate in home environment (PT) Description: LTG: Patient will ambulate in home environment, # of feet with assistance (PT). Flowsheets (Taken 07/26/2021 0737) LTG: Pt will ambulate in home environ  assist needed:: Minimal Assistance - Patient > 75% LTG: Ambulation distance in home environment: 15' Note: Downgraded due to slower than anticipated progress

## 2021-07-26 NOTE — Progress Notes (Addendum)
PROGRESS NOTE   Subjective/Complaints: Erika [name] has no new complaints today, continues to have headache Her daughter and mother-in-law are present for caregiver training with [place].    ROS:  Pt denies SOB, abd pain, CP, N/V/C/D, and vision changes   Objective:   No results found. Recent Labs    07/24/21 0448  WBC 8.7  HGB 12.7  HCT 37.8  PLT 272    Recent Labs    07/24/21 0448  NA 138  K 3.7  CL 98  CO2 32  GLUCOSE 68*  BUN 15  CREATININE 0.83  CALCIUM 10.1     Intake/Output Summary (Last 24 hours) at 07/26/2021 1048 Last data filed at 07/26/2021 0805 Gross per 24 hour  Intake 540 ml  Output --  Net 540 ml         Physical Exam: Vital Signs Blood pressure 126/86, pulse 74, temperature 97.8 F (36.6 C), temperature source Oral, resp. rate 18, height '5\' 6"'$  (1.676 m), weight 97.8 kg, SpO2 95 %. Gen: no distress, normal appearing HEENT: oral mucosa pink and moist, NCAT Cardio: Reg rate Chest: normal effort, normal rate of breathing Abd: soft, non-distended Ext: no edema Psych: pleasant, normal affect Skin: intact Neurological: LUE MAS of 1+ to 2 in L shoulder/L elbow; 1+ in L hand/wrist.   Neurological:    Mental Status: She is alert and oriented to person, place, and time.    Comments: Left facial weakness with mild dysarthria. Able to answer orientation questions and follow commands without difficulty.  0/5 LUE and trace-1/5 prox to 0/5 distally LLE 4/5 on Right side    Assessment/Plan: 1. Functional deficits which require 3+ hours per day of interdisciplinary therapy in a comprehensive inpatient rehab setting. Physiatrist is providing close team supervision and 24 hour management of active medical problems listed below. Physiatrist and rehab team continue to assess barriers to discharge/monitor patient progress toward functional and medical goals  Care Tool:  Bathing    Body  parts bathed by patient: Abdomen, Chest, Left arm, Right arm, Front perineal area, Right upper leg, Left upper leg, Right lower leg, Left lower leg, Face   Body parts bathed by helper: Buttocks     Bathing assist Assist Level: Minimal Assistance - Patient > 75%     Upper Body Dressing/Undressing Upper body dressing   What is the patient wearing?: Pull over shirt    Upper body assist Assist Level: Minimal Assistance - Patient > 75%    Lower Body Dressing/Undressing Lower body dressing      What is the patient wearing?: Pants     Lower body assist Assist for lower body dressing: Minimal Assistance - Patient > 75%     Toileting Toileting    Toileting assist Assist for toileting: Minimal Assistance - Patient > 75%     Transfers Chair/bed transfer  Transfers assist     Chair/bed transfer assist level: Moderate Assistance - Patient 50 - 74%     Locomotion Ambulation   Ambulation assist      Assist level: Moderate Assistance - Patient 50 - 74% Assistive device: Walker-rolling Max distance: 25   Walk 10 feet activity   Assist  Assist level: Moderate Assistance - Patient - 50 - 74% Assistive device: Walker-rolling   Walk 50 feet activity   Assist Walk 50 feet with 2 turns activity did not occur: Safety/medical concerns  Assist level: Dependent - Patient 0% Assistive device: Lite Gait    Walk 150 feet activity   Assist Walk 150 feet activity did not occur: Safety/medical concerns         Walk 10 feet on uneven surface  activity   Assist Walk 10 feet on uneven surfaces activity did not occur: Safety/medical concerns         Wheelchair     Assist Will patient use wheelchair at discharge?: Yes Type of Wheelchair: Manual    Wheelchair assist level: Supervision/Verbal cueing Max wheelchair distance: 119f    Wheelchair 50 feet with 2 turns activity    Assist        Assist Level: Supervision/Verbal cueing   Wheelchair  150 feet activity     Assist      Assist Level: Supervision/Verbal cueing   Blood pressure 126/86, pulse 74, temperature 97.8 F (36.6 C), temperature source Oral, resp. rate 18, height '5\' 6"'$  (1.676 m), weight 97.8 kg, SpO2 95 %.    Medical Problem List and Plan: 1.  Right pontine stroke             -patient may shower             -ELOS: 8/9 extended due to upgrade of goals to supervision so elderly caregiver can assist  pt forgot about new date , she then recalled the change   -Continue CIR therapies including PT, OT, and SLP - Estim patch to finger and wrist extensors  -grounds pass prn   -Continue CIR- PT, OT and SLP 2.  Impaired mobility: Continue Lovenox             -antiplatelet therapy: Continue ASA/Plavix 3. Cluster headaches and migraines: continue abortive oxygen PRN. Oxycodone prn for HA. Discussed outpatient Botox- patient would like to try. D/c periactin and phenergan as patient not using  8/1- pt reports HA's controlled- con't regimen 4. Mood: Team to provide ego support. LCSW to follow for evaluation and support.             -antipsychotic agents: Lamictal daily.  -asked pt to let [place] if she needs help with her mood/coping.  5. Neuropsych: This patient is capable of making decisions on her own behalf. 6. Skin/Wound Care: Routine pressure relief measures. 7. Fluids/Electrolytes/Nutrition: Monitor I/O. Check lytes in am. 8. HTN: SBP goal 140-180 range to avoid hypoperfusion of brain. Per husband "acts drunk when BP is low" --At goal, continue Imdur, Coreg, aldactone, Demadex,  Vitals:   07/25/21 2008 07/26/21 0520  BP: (!) 164/65 126/86  Pulse: 64 74  Resp: 20 18  Temp: 97.8 F (36.6 C) 97.8 F (36.6 C)  SpO2: 98% 95%  Decrease coreg to 3.'125mg'$  daily- in desired range 9. T2DM: Hgb A1C- 10.7. BS run 200's at home. Dietary education/reinforcement.             --Continue Lantus  45 units BID with SSI for elevated BS             Continue decreased Lantus to  22U BID CBG (last 3)  Recent Labs    07/25/21 2035 07/25/21 2059 07/26/21 0623  GLUCAP 133* 118* 102*   CBG labile mainly after dinner elevation - pt states she eats peach cobbler for dessert , ask pt  not to eat tsome improvement with CBG yesterday pm , had jello and applesauce instead  8/1- Pt's evening CBG is elevated, but otherwise, well controlled 10. CAD s/p CABG: On ASA, coreg, Cozaar, Demadex, ASA, 11. PAD w/claudication: Has been smoking 1 PPD --quit at admission. Continue Nicotine patch. --On lyrica BID to manage symptoms.   12. Aspiration PNA: CT reviewed, appreciate medicine consult,  Resolved off abx may d/c Midline 13. Chronic cluster headaches, currently well controlled: Managed with thorazine as well as oxycodone and high flow oxygen prn. Decreased oxycodone to TID PRN and edited order to indicate it should be given after therapy as per husband's request- makes her too sedated to tolerate therapy. 7/16- added Periactin prn for associated Sx's-con't oxy and O2.    8/1- stopped since wasn't using.  14. H/o depression/anxiety: Continue Lamictal, Prozac with Klonopin prn. 15. Chronic insomnia: Managed with home dose trazodone. 16. Dyslipidemia: Trig- 636, direct LDL-99.6, HDL 29, Chol 202. On Zetia--to start Vespc 17. Vitamin B 12 deficiency: B12- 169 (was WNL a year ago) 18. NASH/Fatty liver: LFTs reviewed and normalized 19. Dense left sided hemiplegia: [place] regarding NMES- started trials Friday. They have TENS unit at home.  20. Hypokalemia: supplement 88mq 7/7 and repeat BMP reviewed, K+ improved to 3.8. Resolved. D/c daily supplement.  21. Leukocytosis: trending upward potentially secondary to aspiration pneumonia  7/18 still some elevation today   -afebrile, no s/s on exam   -monitor serially   -hospitalist following 22. Vitamin D deficiency: continue ergocalciferol 50,000U weekly for 7 weeks.  23.  Hypoxia ,resolved  24. AKI: resolved. Repeat Creatinine  monday 25. Poor perfusion of LLE  7/17- per IM, LLE is colder than RLE_ by a lot- and no DP pulse palpable- Vascular said con't current regimen and monitor closely for low perfusion injury. 7/18 leg feels warmer (by comparative accounts). Normal appearing   -recent ABI 0.46 Consider nitroglycerin patch to improve blood flow, but may lower BP and worsen headaches.  Can consider outpatient Qutenza patch for her pain from claudication 26. Left post-stroke shoulder pain: d/c lidocaine patch and ordered voltaren gel. Apply kinesiology tape. Continue to use brace as needed.  262 S/p fall- lac to left knee, Head CT negative- reviewed with patient and husband. Please use Stedy for all transfers.  28. Hypokalemia: start daily 260m supplement 29. Spasticity: patient developing spasticity of LUE in spite of beginning flaccidity- don't think needs Baclofen as of yet - suggest ROM 2-3x/day esp of L shoulder and elbow.  30. Disposition: Lives with husband and daughter (autistic but high functioning)- husband very involved and has already coordinated with his boss to take time off to care for her. HFU scheduled. Daughter will need some education-scheduling. Will provide mother-in-law with list of all medications that she is on and their indications.   >35 minutes spent in discussion of patient's progress, headaches, medications for various conditions- writing these down for her and providing a list, discussing how caregiver training went, discussing discharge day, rescheduling her f/u to an earlier opening  LOS: 28 days A FACE TO FACE EVALUATION WAS [place]/02/2021, 10:48 AM

## 2021-07-26 NOTE — Progress Notes (Signed)
Speech Language Pathology Daily Session Note  Patient Details  Name: Erika [name] MRN: [id number] Date of Birth: 10-27-1971  Today's Date: 07/26/2021 SLP Individual Time: [id number] SLP Individual Time Calculation (min): 55 min  Short Term Goals: Week 4: SLP Short Term Goal 1 (Week 4): Patient will perform mildly complex problem solving and reasoning tasks with minA cues for accuracy. SLP Short Term Goal 2 (Week 4): Patient will demonstrate alternating attention during basic to mildly complex functional tasks at supervision to minA level. SLP Short Term Goal 3 (Week 4): Patient will tolerate regular texture solids, thin liquids during meals with intermittent A for swallow safety. SLP Short Term Goal 4 (Week 4): Patient will demonstrate anticipatory awareness during functional tasks and planning for future discharge home, with minA.  Skilled Therapeutic Interventions: Skilled treatment session focused on cognitive goals. During yesterday's session, both the patient and her husband requested "games" they could play at home to include their daughter and help patient stay cognitively and communicatively engaged at home.  SLP facilitated session today by teaching and demonstrating both the games "blink" and "head bands for adults." Patient completed blink with Mod I for problem solving and required supervision level verbal cues for use of speech intelligibility strategies at the sentence level during "head bands." Patient left upright in wheelchair with alarm on and all needs within reach. Continue with current plan of care.      Pain Pain Assessment Pain Scale: 0-10 Pain Score: 0-No pain  Therapy/Group: Individual Therapy  Chasya Zenz 07/26/2021, 3:27 PM

## 2021-07-26 NOTE — Progress Notes (Signed)
Physical Therapy Session Note  Patient Details  Name: Erika [name] MRN: [id number] Date of Birth: May 29, 1971  Today's Date: 07/26/2021 PT Individual Time: 1030-1100 PT Individual Time Calculation (min): 30 min   Short Term Goals: Week 3:  PT Short Term Goal 1 (Week 3): Pt will complete bed mobility with minA PT Short Term Goal 1 - Progress (Week 3): Not met (requires modA) PT Short Term Goal 2 (Week 3): Pt will complete bed<>chair transfers with minA and LRAD PT Short Term Goal 2 - Progress (Week 3): Partly met (performance fluctuates daily b/w minA to modA) PT Short Term Goal 3 (Week 3): Pt will ambulate 58ft with modA of 1 person and LRAD PT Short Term Goal 3 - Progress (Week 3): Met PT Short Term Goal 4 (Week 3): Husband will participate in hands on training PT Short Term Goal 4 - Progress (Week 3): Partly met (Needs further training) PT Short Term Goal 5 (Week 3): Pt will initiate stair training PT Short Term Goal 5 - Progress (Week 3): Not met (deferred due to safety concerns. Husband will be getting a ramp to enter home)  Skilled Therapeutic Interventions/Progress Updates:    Pt received sitting in wc with family upon entry. Pt agreeable to physical therapy session. Gait belt placed prior to initiation of mobility. Mother/daughter present during session. Give mohr sling donned totalA to LUE.  Wheelchair mobility completed with hemi-propulsion technique 21ft. Supervision and verbal/tactile cuing for increased arm leverage for energy efficiency. Pt requires assistance with management of wc parts.   STS during session completed with MinA, therapist providing knee block to LLE.  Intermittent heavy ModA to prevent LOB due to RLE buckle and narrow BOS. Pt states right knee buckle 2/2 hx of PAD and reports having difficulty prior to stroke and would furniture/wall walk for support. One instance of sitting back into wc to reset for balance. Verbal cuing for anterior weight shifting.    Gait training completed 60ft + 11ft + 74ft with HW ModA progressing to MinA by 3rd bout. Therapist provided soft LLE knee block with verbal cuing for quad activation in stance. Verbal and visual cuing for placement of HW to prevent device blocking RLE.  Verbal cuing for increased step length bilaterally, sequencing with HW, intermittent cuing for LLE abduction to prevent narrowing of BOS, and increased step height LLE. Intermittent heavy ModA for stability with LLE knee block secondary to BLE knee buckling and LOB right/posteriorly. Balance and stability improved by 3rd gait training bout.   Pt sitting in wc with brakes locked. Pt and family requesting to use day pass to go outside for fresh air at end of session.  RN notified and agreeable.   Therapy Documentation Precautions:  Precautions Precautions: Fall Precaution Comments: L hemiplegia Restrictions Weight Bearing Restrictions: No LUE Weight Bearing: Weight bearing as tolerated LLE Weight Bearing: Weight bearing as tolerated  Pain: Pain Assessment Pain Scale: 0-10 Pain Score: 0-No pain   Therapy/Group: Individual Therapy  Latonyia Lopata, SPT 07/26/2021, 3:14 PM

## 2021-07-26 NOTE — Telephone Encounter (Signed)
Thank you for the update!

## 2021-07-26 NOTE — Progress Notes (Signed)
Physical Therapy Weekly Progress Note  Patient Details  Name: Erika [name] MRN: [id number] Date of Birth: 07/08/1971  Beginning of progress report period: July 20, 2021 End of progress report period: July 26, 2021  Today's Date: 07/26/2021 PT Individual Time: 0900-0958 PT Individual Time Calculation (min): 58 min   Patient has met 2 of 4 short term goals. Pt making steady progress towards goals. Family training with husband and daughter have been initiated and continues to be implemented during therapy sessions as able. She continues to require min/modA for bed mobility, minA for squat<>pivot transfers, and requires min to modA for gait up to 22f with use of hemi-walker. She is able to propel herself in w/c with supervision but requires assist for w/c parts/management. Her performance fluctuates daily depending on fatigue, sustained/focused attention, and LLE weakness.   Patient continues to demonstrate the following deficits muscle weakness, decreased cardiorespiratoy endurance, impaired timing and sequencing, abnormal tone, unbalanced muscle activation, motor apraxia, decreased coordination, and decreased motor planning, decreased attention to left and decreased motor planning, decreased attention, decreased awareness, decreased problem solving, decreased safety awareness, and decreased memory, and decreased sitting balance, decreased standing balance, decreased postural control, hemiplegia, and decreased balance strategies and therefore will continue to benefit from skilled PT intervention to increase functional independence with mobility.  Patient progressing toward long term goals..  Plan of care revisions: LTG downgraded to minA overall due to slower than anticipated progress and above listed deficits.  PT Short Term Goals Week 4:  PT Short Term Goal 1 (Week 4): Family training/education will be incorporated in therapy sessions to prepare for discharge and reduce caregiver burden PT  Short Term Goal 1 - Progress (Week 4): Met PT Short Term Goal 2 (Week 4): Pt will complete sit<>stand transfers with CGA and LRAD PT Short Term Goal 2 - Progress (Week 4): Met PT Short Term Goal 3 (Week 4): Pt will consistently complete squat<>pivot transfers with minA and LRAD PT Short Term Goal 3 - Progress (Week 4): Partly met (inconsistent) PT Short Term Goal 4 (Week 4): Pt will ambulate 581fwith minA and LRAD PT Short Term Goal 4 - Progress (Week 4): Not met Week 5:  PT Short Term Goal 1 (Week 5): STG = LTG due to ELOS  Skilled Therapeutic Interventions/Progress Updates:     Pt greeted seated in recliner to start session. Daughter and mother in law at bedside and present throughout session for family training/education. Pt agreeable to therapy. Reviewed w/c management parts with family including operation of leg rest, arm rest, and brake locking. Daughter practiced removing and donning all these parts with min verbal cues.   Completed squat<>pivot transfer with minA from recliner to w/c and verbalized steps with daughter. Pt then wheeled to day room rehab gym for time.   Focused remainder of session on blocked practice squat<>pivot transfers with family training. PT performed x4 squat<>pivot transfers from w/c to mat table with minA and continued to verbalize setup and technique with daughter. Daughter completed an additional x4 squat<>pivot transfers with therapist providing CGA/minA for safety. Daughter somewhat overwhelmed at start but showing improved confidence after repetition. Daughter will benefit from further ed/training as able.   Pt returned to her room and remained seated in w/c with safety belt alarm on and all needs in reach at end of session, family remained at bedside.   Therapy Documentation Precautions:  Precautions Precautions: Fall Precaution Comments: L hemiplegia Restrictions Weight Bearing Restrictions: No LUE Weight Bearing: Weight bearing as  tolerated LLE  Weight Bearing: Weight bearing as tolerated General:    Therapy/Group: Individual Therapy  [name] 07/26/2021, 7:32 AM

## 2021-07-26 NOTE — Telephone Encounter (Signed)
FYI called to reschedule patient from Dr. Adalberto Cole schedule. Patient is currently in rehab, she had a stroke aroung the 1st of July. I canceled her appointment. Husband will call if she needs to reschedule.

## 2021-07-27 LAB — GLUCOSE, CAPILLARY
Glucose-Capillary: 101 mg/dL — ABNORMAL HIGH (ref 70–99)
Glucose-Capillary: 176 mg/dL — ABNORMAL HIGH (ref 70–99)
Glucose-Capillary: 220 mg/dL — ABNORMAL HIGH (ref 70–99)
Glucose-Capillary: 170 mg/dL — ABNORMAL HIGH (ref 70–99)

## 2021-07-27 MED ORDER — OXYCODONE HCL 5 MG PO TABS
5.0000 mg | ORAL_TABLET | Freq: Two times a day (BID) | ORAL | Status: DC | PRN
  Administered 2021-07-27 – 2021-07-31 (×8): 5 mg via ORAL
  Filled 2021-07-27 (×8): qty 1

## 2021-07-27 NOTE — Progress Notes (Signed)
Occupational Therapy Session Note  Patient Details  Name: Erika [name] MRN: [id number] Date of Birth: [birth year]  Today's Date: 07/27/2021 OT Individual Time: 1100-1200 OT Individual Time Calculation (min): 60 min    Short Term Goals: Week 4:  OT Short Term Goal 1 (Week 4): STGs = LTGs  Skilled Therapeutic Interventions/Progress Updates:    Pt sitting up in w/c, daughter and mother in law present for caregiver education.  Pt reports she got her new w/c.  BLE leg rests adjusted to improve hip and knee resting angle. Educated daughter and mother in law on various w/c controls and how to manage with return demonstration and multiple attempts completed.  After multiple tries, able to fade from max assist to supervision however both daughter and mother in law still needing min to mod Vcs to operate controls.    Blocked practice completed with daughter and pt for squat pivot w/c<>drop arm 3in1 commode transfer then sit<>stands with simulation of assisting in clothing management during toileting.  Daughter needing max step by step cues initially for w/c and commode placement and body mechanics and min assist to safely complete transfer with pt then able to fade to mod cues and min assist to complete.    Daughter and mother in law would benefit from further caregiver training to ensure independence and safety.  Call bell in reach, seat alarm at end of session.  Therapy Documentation Precautions:  Precautions Precautions: Fall Precaution Comments: L hemiplegia Restrictions Weight Bearing Restrictions: No LUE Weight Bearing: Weight bearing as tolerated LLE Weight Bearing: Weight bearing as tolerated    Therapy/Group: Individual Therapy  Ezekiel Slocumb 07/27/2021, 12:41 PM

## 2021-07-27 NOTE — Progress Notes (Signed)
Physical Therapy Session Note  Patient Details  Name: Erika [name] MRN: [id number] Date of Birth: Nov 15, 1971  Today's Date: 07/27/2021 PT Individual Time: 0930-1030 + 1400-1430 PT Individual Time Calculation (min): 60 min  + 30 min  Short Term Goals: Week 5:  PT Short Term Goal 1 (Week 5): STG = LTG due to ELOS  Skilled Therapeutic Interventions/Progress Updates:     1st session: Pt seated in w/c to start session. Daughter and mother in law at bedside for scheduled family ed/training. Pt denies pain but reports fatigue from yesterday's busy day of therapies. Daughter assisted with removing hospital socks and donning regular socks. She also assisted with donning shoes with L AFO where she required minA for getting heel into shoe. Educated on strategies to improve ability to don L AFO with shoe. Daughter completed x2 squat<>pivot transfers with pt with PT providing CGA for safety - daughter with good memory and ability to set pt up appropriately for w/c setup including locking brakes, removing leg rests, and removing arm rest. Reminded daughter of importance of body mechanics to avoid hurting herself during transfer. Pt requesting to focus remainder of session on gait training with daughter. She ambulated ~59f with minA and HW with +2 assist for w/c follow for safety - cues needed for keeping HW laterally to allow step-through for RLE and also for reducing R lean. Daughter actively observing and pt wanting daughter to assist. Reinforced importance of fall prevention and avoiding ambulating with daughter at home. Daughter assisted pt in sit<>[facility]. Attempted to initiate short distance gait with daughter assisting but pt unable to safely step without LOB and lean to the L. Deferred further efforts for safety. PT provided minA squat<>pivot transfer to mat table and worked pRecruitment consultant 1x10 sit<>stands with CGA and then forward/backward stepping with LLE to visual  target and minA for balance. Squat<>pivot with minA back to her w/c and then returned to her room. She remained seated in w/c with safety belt on, needs in reach, family at bedside.  2nd session: Pt seated in w/c to start session. Daughter and mother in law at bedside. Pt has DME delivered to room - wheelchair and 1/2 lap tray. 1/2 lap tray unfortunately does not fit her chair and is too tight around waist. CSW notified to attempt to get trough instead. Pt and daughter asking what to do in case pt falls at home. Educated on use of emergency services and to NOT try and get the patient up by herself. Focused remainder of session to review car transfers with family (daughter and mother in law). Pt wheeled to ortho rehab gym and car simulator set to low height to simulate [employer] Pt completed car transfer with min/modA via squat<>pivot and needing totalA for LLE management into and out of the car. Pt able to reposition herself in car without assist. Increased difficulty getting out of the car than into the car - reminded pt importance of hip clearance, setup with w/c and LE's, and technique to reduce risk of fall and reduce caregiver burden. Pt returned to room in w/c and remained seated with seat alarm on and all needs in reach. LUE in trough for shoulder support.   Therapy Documentation Precautions:  Precautions Precautions: Fall Precaution Comments: L hemiplegia Restrictions Weight Bearing Restrictions: No LUE Weight Bearing: Weight bearing as tolerated LLE Weight Bearing: Weight bearing as tolerated General:     Therapy/Group: Individual Therapy  CAlger Simons8/03/2021, 7:41  AM  

## 2021-07-27 NOTE — Progress Notes (Signed)
Speech Language Pathology Weekly Progress and Session Note  Patient Details  Name: Erika [name] MRN: [id number] Date of Birth: February 02, 1971  Beginning of progress report period: July 20, 2021 End of progress report period: July 27, 2021  Today's Date: 07/27/2021 SLP Individual Time: 1440-1510 SLP Individual Time Calculation (min): 30 min  Short Term Goals: Week 4: SLP Short Term Goal 1 (Week 4): Patient will perform mildly complex problem solving and reasoning tasks with minA cues for accuracy. SLP Short Term Goal 1 - Progress (Week 4): Met SLP Short Term Goal 2 (Week 4): Patient will demonstrate alternating attention during basic to mildly complex functional tasks at supervision to minA level. SLP Short Term Goal 2 - Progress (Week 4): Met SLP Short Term Goal 3 (Week 4): Patient will tolerate regular texture solids, thin liquids during meals with intermittent A for swallow safety. SLP Short Term Goal 3 - Progress (Week 4): Met SLP Short Term Goal 4 (Week 4): Patient will demonstrate anticipatory awareness during functional tasks and planning for future discharge home, with minA. SLP Short Term Goal 4 - Progress (Week 4): Met    New Short Term Goals: Week 5: SLP Short Term Goal 1 (Week 5): STGs=LTGs due to ELOS  Weekly Progress Updates: Patient continues to make excellent progress and has met 4 of 4 STGs this reporting period. Currently, patient is consuming regular textures with thin liquids with minimal overt s/s of aspiration with intermittent supervision level verbal cues needed for use of swallowing compensatory strategies. Patient also demonstrates improvement in overall speech intelligibility but requires supervision-Min A verbal cues for use of strategies at the conversation level. Min A verbal cues are also required for complex problem solving, alternating attention and awareness. Patient and family education ongoing. Patient would benefit from continued skilled SLP  intervention to maximize her swallowing and cognitive functioning as well as her speech intelligibility prior to discharge.     Intensity: Minumum of 1-2 x/day, 30 to 90 minutes Frequency: 3 to 5 out of 7 days Duration/Length of Stay: 08/01/21 Treatment/Interventions: Cognitive remediation/compensation;Dysphagia/aspiration precaution training;Patient/family education;Functional tasks;Speech/Language facilitation;Internal/external aids;Cueing hierarchy;Environmental controls;Therapeutic Activities   Daily Session  Skilled Therapeutic Interventions:  Skilled treatment session focused on ongoing education regarding strategies to utilize at home to maximize memory and overall cognitive functioning. Patient required Min verbal cues for topic maintenance and continues to require overall Min A verbal cues for anticipatory awareness regarding length of recovery. Patient left upright in wheelchair with alarm on and all needs within reach . Continue with current plan of care.    Pain No/Denies Pain   Therapy/Group: Individual Therapy  Laurice Kimmons 07/27/2021, 6:37 AM

## 2021-07-27 NOTE — Progress Notes (Signed)
Pt slept well throughout the night. Continent of urine, toileted by staff. PVR=0 ml. Pt reports headache at beginning of shift within 2 hours of prn med from previous shift. Declined prn tylenol. No prn meds given this shift.

## 2021-07-27 NOTE — Progress Notes (Signed)
PROGRESS NOTE   Subjective/Complaints: No complaints this morning Daughter and mother-in-law continuing to receive caregiver training Shoulder pain is much improved   ROS:  Pt denies SOB, abd pain, CP, N/V/C/D, and vision changes   Objective:   No results found. No results for input(s): WBC, HGB, HCT, PLT in the last 72 hours.   No results for input(s): NA, K, CL, CO2, GLUCOSE, BUN, CREATININE, CALCIUM in the last 72 hours.    Intake/Output Summary (Last 24 hours) at 07/27/2021 1256 Last data filed at 07/27/2021 0800 Gross per 24 hour  Intake 540 ml  Output --  Net 540 ml         Physical Exam: Vital Signs Blood pressure (!) 147/72, pulse 72, temperature 98.2 F (36.8 C), temperature source Oral, resp. rate 16, height '5\' 6"'$  (1.676 m), weight 83.7 kg, SpO2 92 %. Gen: no distress, normal appearing HEENT: oral mucosa pink and moist, NCAT Cardio: Reg rate Chest: normal effort, normal rate of breathing Abd: soft, non-distended Ext: no edema Psych: pleasant, normal affect Skin: intact Neurological: LUE MAS of 1+ to 2 in L shoulder/L elbow; 1+ in L hand/wrist.   Neurological:    Mental Status: She is alert and oriented to person, place, and time.    Comments: Left facial weakness with mild dysarthria. Able to answer orientation questions and follow commands without difficulty.  0/5 LUE and trace-1/5 prox to 0/5 distally LLE 4/5 on Right side    Assessment/Plan: 1. Functional deficits which require 3+ hours per day of interdisciplinary therapy in a comprehensive inpatient rehab setting. Physiatrist is providing close team supervision and 24 hour management of active medical problems listed below. Physiatrist and rehab team continue to assess barriers to discharge/monitor patient progress toward functional and medical goals  Care Tool:  Bathing    Body parts bathed by patient: Abdomen, Chest, Left arm, Right  arm, Front perineal area, Right upper leg, Left upper leg, Right lower leg, Left lower leg, Face   Body parts bathed by helper: Buttocks     Bathing assist Assist Level: Minimal Assistance - Patient > 75%     Upper Body Dressing/Undressing Upper body dressing   What is the patient wearing?: Pull over shirt    Upper body assist Assist Level: Minimal Assistance - Patient > 75%    Lower Body Dressing/Undressing Lower body dressing      What is the patient wearing?: Pants     Lower body assist Assist for lower body dressing: Minimal Assistance - Patient > 75%     Toileting Toileting    Toileting assist Assist for toileting: Minimal Assistance - Patient > 75%     Transfers Chair/bed transfer  Transfers assist     Chair/bed transfer assist level: Moderate Assistance - Patient 50 - 74%     Locomotion Ambulation   Ambulation assist      Assist level: Moderate Assistance - Patient 50 - 74% Assistive device: Walker-rolling Max distance: 25   Walk 10 feet activity   Assist     Assist level: Moderate Assistance - Patient - 50 - 74% Assistive device: Walker-rolling   Walk 50 feet activity   Assist Walk  50 feet with 2 turns activity did not occur: Safety/medical concerns  Assist level: Dependent - Patient 0% Assistive device: Lite Gait    Walk 150 feet activity   Assist Walk 150 feet activity did not occur: Safety/medical concerns         Walk 10 feet on uneven surface  activity   Assist Walk 10 feet on uneven surfaces activity did not occur: Safety/medical concerns         Wheelchair     Assist Will patient use wheelchair at discharge?: Yes Type of Wheelchair: Manual    Wheelchair assist level: Supervision/Verbal cueing Max wheelchair distance: 175f    Wheelchair 50 feet with 2 turns activity    Assist        Assist Level: Supervision/Verbal cueing   Wheelchair 150 feet activity     Assist      Assist Level:  Supervision/Verbal cueing   Blood pressure (!) 147/72, pulse 72, temperature 98.2 F (36.8 C), temperature source Oral, resp. rate 16, height '5\' 6"'$  (1.676 m), weight 83.7 kg, SpO2 92 %.    Medical Problem List and Plan: 1.  Right pontine stroke             -patient may shower             -ELOS: 8/9 extended due to upgrade of goals to supervision so elderly caregiver can assist  pt forgot about new date , she then recalled the change   -Continue CIR therapies including PT, OT, and SLP - Estim patch to finger and wrist extensors  -grounds pass prn  2.  Impaired mobility: Continue Lovenox             -antiplatelet therapy: Continue ASA/Plavix 3. Cluster headaches and migraines: continue abortive oxygen PRN. Decrease oxycodone to BID prn for HA. Discussed outpatient Botox- patient would like to try. D/c periactin and phenergan as patient not using 4. Mood: Team to provide ego support. LCSW to follow for evaluation and support.             -antipsychotic agents: Lamictal daily.  -asked pt to let uKoreaknow if she needs help with her mood/coping.  5. Neuropsych: This patient is capable of making decisions on her own behalf. 6. Skin/Wound Care: Routine pressure relief measures. 7. Fluids/Electrolytes/Nutrition: Monitor I/O. Lytes reviewed and stable 8. HTN: SBP goal 140-180 range to avoid hypoperfusion of brain. Per husband "acts drunk when BP is low" --At goal, continue Imdur, Coreg, aldactone, Demadex,  Vitals:   07/26/21 1920 07/27/21 0352  BP: 128/76 (!) 147/72  Pulse: 75 72  Resp: 16 16  Temp: 98.4 F (36.9 C) 98.2 F (36.8 C)  SpO2: 96% 92%  D/c coreg 9. T2DM: Hgb A1C- 10.7. BS run 200's at home. Dietary education/reinforcement.             --Continue Lantus  45 units BID with SSI for elevated BS             Continue decreased Lantus to 22U BID CBG (last 3)  Recent Labs    07/26/21 2037 07/27/21 0621 07/27/21 1136  GLUCAP 169* 101* 176*   CBG labile mainly after dinner  elevation - pt states she eats peach cobbler for dessert , ask pt not to eat tsome improvement with CBG yesterday pm , had jello and applesauce instead  8/1- Pt's evening CBG is elevated, but otherwise, well controlled 10. CAD s/p CABG: On ASA, coreg, Cozaar, Demadex, ASA, 11. PAD w/claudication: Has  been smoking 1 PPD --quit at admission. Continue Nicotine patch. --On lyrica BID to manage symptoms.   12. Aspiration PNA: CT reviewed, appreciate medicine consult,  Resolved off abx may d/c Midline 13. Chronic cluster headaches, currently well controlled: Managed with thorazine as well as oxycodone and high flow oxygen prn. Decreased oxycodone to TID PRN and edited order to indicate it should be given after therapy as per husband's request- makes her too sedated to tolerate therapy. 7/16- added Periactin prn for associated Sx's-con't oxy and O2.    8/1- stopped since wasn't using.  14. H/o depression/anxiety: Continue Lamictal, Prozac with Klonopin prn. 15. Chronic insomnia: Managed with home dose trazodone. 16. Dyslipidemia: Trig- 636, direct LDL-99.6, HDL 29, Chol 202. On Zetia--to start Vespc 17. Vitamin B 12 deficiency: B12- 169 (was WNL a year ago) 18. NASH/Fatty liver: LFTs reviewed and normalized 19. Dense left sided hemiplegia: [place] regarding NMES- started trials Friday. They have TENS unit at home.  20. Hypokalemia: supplement 37mq 7/7 and repeat BMP reviewed, K+ improved to 3.8. Resolved. D/c daily supplement.  21. Leukocytosis: trending upward potentially secondary to aspiration pneumonia  7/18 still some elevation today   -afebrile, no s/s on exam   -monitor serially   -hospitalist following 22. Vitamin D deficiency: continue ergocalciferol 50,000U weekly for 7 weeks.  23.  Hypoxia ,resolved  24. AKI: resolved. Repeat Creatinine monday 25. Poor perfusion of LLE  7/17- per IM, LLE is colder than RLE_ by a lot- and no DP pulse palpable- Vascular said con't current regimen  and monitor closely for low perfusion injury. 7/18 leg feels warmer (by comparative accounts). Normal appearing   -recent ABI 0.46 Consider nitroglycerin patch to improve blood flow, but may lower BP and worsen headaches.  Can consider outpatient Qutenza patch for her pain from claudication 26. Left post-stroke shoulder pain: d/c lidocaine patch and ordered voltaren gel. Apply kinesiology tape. Continue to use brace as needed.  256 S/p fall- lac to left knee, Head CT negative- reviewed with patient and husband. Please use Stedy for all transfers.  28. Hypokalemia: start daily 269m supplement 29. Spasticity: patient developing spasticity of LUE in spite of beginning flaccidity- don't think needs Baclofen as of yet - suggest ROM 2-3x/day esp of L shoulder and elbow.  30. Disposition: Lives with husband and daughter (autistic but high functioning)- husband very involved and has already coordinated with his boss to take time off to care for her. HFU scheduled. Daughter will need some education-scheduling. Will provide mother-in-law with list of all medications that she is on and their indications.     LOS: 29 days A FACE TO FACE EVALUATION WAS PERFORMED  KrClide Deutscheraulkar 07/27/2021, 12:56 PM

## 2021-07-28 DIAGNOSIS — F32A Depression, unspecified: Secondary | ICD-10-CM

## 2021-07-28 DIAGNOSIS — F4312 Post-traumatic stress disorder, chronic: Secondary | ICD-10-CM

## 2021-07-28 DIAGNOSIS — F418 Other specified anxiety disorders: Secondary | ICD-10-CM

## 2021-07-28 LAB — GLUCOSE, CAPILLARY
Glucose-Capillary: 77 mg/dL (ref 70–99)
Glucose-Capillary: 133 mg/dL — ABNORMAL HIGH (ref 70–99)
Glucose-Capillary: 191 mg/dL — ABNORMAL HIGH (ref 70–99)
Glucose-Capillary: 191 mg/dL — ABNORMAL HIGH (ref 70–99)

## 2021-07-28 NOTE — Progress Notes (Signed)
Occupational Therapy Session Note  Patient Details  Name: Erika [name] MRN: [id number] Date of Birth: 23-Nov-1971  Today's Date: 07/28/2021 OT Group Time: [id number] OT Group Time Calculation (min): 60 min   Short Term Goals: Week 4:  OT Short Term Goal 1 (Week 4): STGs = LTGs  Skilled Therapeutic Interventions/Progress Updates:  Pt seen for group session with a focus on home management tasks and kitchen tasks. Education provided on energy conservation strategies for home mgmt and kitchen tasks. Pt reports at home she plans to navigate her kitchen from w/c level. 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 Pt able to reach into oven and refrigerator from w/c level. Education provided on relocating commonly used items to countertop as pt with difficulty reaching OH into cabinets or below knee level to retrieve items from lower cabinets. Education also provided on energy conservation strategies for home mgmt tasks such as using smaller cleaning bottles so pt does not have to expend increased energy picking up heavy bottles. Education provided on using LH sponge to clean shower/ bathtub from w/c level. Pt reports she has very supportive family at home and feels she will have great family support to help her complete IADLs at home. Pt transported back to room by RT.   Therapy Documentation Precautions:  Precautions Precautions: Fall Precaution Comments: L hemiplegia Restrictions Weight Bearing Restrictions: No LUE Weight Bearing: Weight bearing as tolerated LLE Weight Bearing: Weight bearing as tolerated  Pain:  Pt reports no pain during session.    Therapy/Group: Group Therapy  Precious Haws 07/28/2021, 2:52 PM

## 2021-07-28 NOTE — Progress Notes (Signed)
Speech Language Pathology Daily Session Note  Patient Details  Name: Erika [name] MRN: [id number] Date of Birth: December 12, 1971  Today's Date: 07/28/2021 SLP Individual Time: [id number] SLP Individual Time Calculation (min): 40 min  Short Term Goals: Week 5: SLP Short Term Goal 1 (Week 5): STGs=LTGs due to ELOS  Skilled Therapeutic Interventions: Skilled treatment session focused on cognitive goals. SLP administered the [facility] Mental Status Examination (SLUMS). Patient scored  24/30 points with a score of 27 or above considered normal. Patient continues to demonstrate deficits in short-term recall and problem solving. Patient educated regarding results and clinical reasoning for f/u SLP services upon discharge. Patient verbalized understanding and agreement. Patient continues to demonstrate fluctuating levels of awareness in regards to recovery timeline. Patient handed off to OT. Continue with current plan of care.     Pain Pain Assessment Pain Scale: 0-10 Pain Score: 2  Headache, RN aware and patient premedicated   Therapy/Group: Individual Therapy  Aeon Koors 07/28/2021, 12:35 PM

## 2021-07-28 NOTE — Progress Notes (Signed)
Physical Therapy Session Note  Patient Details  Name: Erika [name] MRN: [id number] Date of Birth: 1971/08/17  Today's Date: 07/28/2021 PT Individual Time: 1430-1530 PT Individual Time Calculation (min): 60 min   Short Term Goals: Week 5:  PT Short Term Goal 1 (Week 5): STG = LTG due to ELOS  Skilled Therapeutic Interventions/Progress Updates:     Pt seated in w/c at start of session, agreeable to therapy. Husband at bedside where focus of session was to review home safety, DC planing, DME rec's, f/u therapies, etc to allow husband and patient to feel more confident and comfortable with next week's discharge home. Pt requesting to focus session on education rather than mobility. Husband questioning w/c fit into doorways of the house. Bedroom 29". [place] door. 33". Kitchen  32". Her w/c measures 29.75" from rim-to-rim and showed husband how to remove her L rim with phillips screwdriver to gain 1.5" of space (husband is handy and voiced understanding). Lengthy discussion during session on fall prevention, safety awareness, utilizing family assist for ALL mobility, and NO walking at home unless with therapy. Educated on Q30 repositioning in w/c for skin protection and also educated on using Depends or briefs at home for urge incontinence. Husband reports the ramp to enter the house will be completed by Saturday. All questions and concerns addressed from pt and husband - appreciative of education. Pt ended session as she was found - sitting in w/c with all needs met and husband at bedside.   Therapy Documentation Precautions:  Precautions Precautions: Fall Precaution Comments: L hemiplegia Restrictions Weight Bearing Restrictions: No LUE Weight Bearing: Weight bearing as tolerated LLE Weight Bearing: Weight bearing as tolerated General:    Therapy/Group: Individual Therapy  [name] 07/28/2021, 7:43 AM

## 2021-07-28 NOTE — Progress Notes (Signed)
PROGRESS NOTE   Subjective/Complaints: Appreciate neuropsych eval today She is in good spirits, eager for d/c next week Minimal improvements in left arm strength but great improvements in ambulation and ADLs   ROS:  Pt denies SOB, abd pain, CP, N/V/C/D, and vision changes, +chronic headaches   Objective:   No results found. No results for input(s): WBC, HGB, HCT, PLT in the last 72 hours.   No results for input(s): NA, K, CL, CO2, GLUCOSE, BUN, CREATININE, CALCIUM in the last 72 hours.    Intake/Output Summary (Last 24 hours) at 07/28/2021 1125 Last data filed at 07/28/2021 0700 Gross per 24 hour  Intake 620 ml  Output --  Net 620 ml         Physical Exam: Vital Signs Blood pressure 138/85, pulse 69, temperature 97.8 F (36.6 C), temperature source Oral, resp. rate 18, height '5\' 6"'$  (1.676 m), weight 83.7 kg, SpO2 93 %. Gen: no distress, normal appearing HEENT: oral mucosa pink and moist, NCAT Cardio: Reg rate Chest: normal effort, normal rate of breathing Abd: soft, non-distended Ext: no edema Psych: pleasant, normal affect Neurological: LUE MAS of 1+ to 2 in L shoulder/L elbow; 1+ in L hand/wrist.   Neurological:    Mental Status: She is alert and oriented to person, place, and time.    Comments: Left facial weakness with mild dysarthria. Able to answer orientation questions and follow commands without difficulty.  0/5 LUE and trace-1/5 prox to 0/5 distally LLE 4/5 on Right side    Assessment/Plan: 1. Functional deficits which require 3+ hours per day of interdisciplinary therapy in a comprehensive inpatient rehab setting. Physiatrist is providing close team supervision and 24 hour management of active medical problems listed below. Physiatrist and rehab team continue to assess barriers to discharge/monitor patient progress toward functional and medical goals  Care Tool:  Bathing    Body parts bathed  by patient: Abdomen, Chest, Left arm, Right arm, Front perineal area, Right upper leg, Left upper leg, Right lower leg, Left lower leg, Face   Body parts bathed by helper: Buttocks     Bathing assist Assist Level: Minimal Assistance - Patient > 75%     Upper Body Dressing/Undressing Upper body dressing   What is the patient wearing?: Pull over shirt    Upper body assist Assist Level: Minimal Assistance - Patient > 75%    Lower Body Dressing/Undressing Lower body dressing      What is the patient wearing?: Pants     Lower body assist Assist for lower body dressing: Minimal Assistance - Patient > 75%     Toileting Toileting    Toileting assist Assist for toileting: Minimal Assistance - Patient > 75%     Transfers Chair/bed transfer  Transfers assist     Chair/bed transfer assist level: Moderate Assistance - Patient 50 - 74%     Locomotion Ambulation   Ambulation assist      Assist level: Moderate Assistance - Patient 50 - 74% Assistive device: Walker-rolling Max distance: 25   Walk 10 feet activity   Assist     Assist level: Moderate Assistance - Patient - 50 - 74% Assistive device: Walker-rolling  Walk 50 feet activity   Assist Walk 50 feet with 2 turns activity did not occur: Safety/medical concerns  Assist level: Dependent - Patient 0% Assistive device: Lite Gait    Walk 150 feet activity   Assist Walk 150 feet activity did not occur: Safety/medical concerns         Walk 10 feet on uneven surface  activity   Assist Walk 10 feet on uneven surfaces activity did not occur: Safety/medical concerns         Wheelchair     Assist Will patient use wheelchair at discharge?: Yes Type of Wheelchair: Manual    Wheelchair assist level: Supervision/Verbal cueing Max wheelchair distance: 151f    Wheelchair 50 feet with 2 turns activity    Assist        Assist Level: Supervision/Verbal cueing   Wheelchair 150 feet  activity     Assist      Assist Level: Supervision/Verbal cueing   Blood pressure 138/85, pulse 69, temperature 97.8 F (36.6 C), temperature source Oral, resp. rate 18, height '5\' 6"'$  (1.676 m), weight 83.7 kg, SpO2 93 %.    Medical Problem List and Plan: 1.  Right pontine stroke             -patient may shower             -ELOS: 8/9 extended due to upgrade of goals to supervision so elderly caregiver can assist  pt forgot about new date , she then recalled the change   -Continue CIR therapies including PT, OT, and SLP - Estim patch to finger and wrist extensors  -grounds pass prn  2.  Impaired mobility: Continue Lovenox             -antiplatelet therapy: Continue ASA/Plavix 3. Cluster headaches and migraines: continue abortive oxygen PRN. Decrease oxycodone to BID prn for HA. Discussed outpatient Botox- patient would like to try. D/c periactin and phenergan as patient not using 4. Mood: Team to provide ego support. LCSW to follow for evaluation and support.             -antipsychotic agents: Lamictal daily.  -asked pt to let [place]now if she needs help with her mood/coping.  5. Neuropsych: This patient is capable of making decisions on her own behalf. 6. Skin/Wound Care: Routine pressure relief measures. 7. Fluids/Electrolytes/Nutrition: Monitor I/O. Lytes reviewed and stable 8. HTN: SBP goal 140-180 range to avoid hypoperfusion of brain. Per husband "acts drunk when BP is low" --At goal, continue Imdur, Coreg, aldactone, Demadex,  Vitals:   07/27/21 2024 07/28/21 0405  BP:  138/85  Pulse:  69  Resp:  18  Temp: 98.3 F (36.8 C) 97.8 F (36.6 C)  SpO2:  93%  D/c coreg 9. T2DM: Hgb A1C- 10.7. BS run 200's at home. Dietary education/reinforcement.             Continue decreased Lantus to 22U BID CBG (last 3)  Recent Labs    07/27/21 1631 07/27/21 2036 07/28/21 0613  GLUCAP 220* 170* 77   10. CAD s/p CABG: On ASA, coreg, Cozaar, Demadex, ASA, 11. PAD w/claudication: Has  been smoking 1 PPD --quit at admission. Continue Nicotine patch. --On lyrica BID to manage symptoms.   12. Aspiration PNA: CT reviewed, appreciate medicine consult,  Resolved off abx may d/c Midline 13. Chronic cluster headaches, currently well controlled: Managed with thorazine as well as oxycodone and high flow oxygen prn. Decreased oxycodone to TID PRN and edited order to  indicate it should be given after therapy as per husband's request- makes her too sedated to tolerate therapy. 7/16- added Periactin prn for associated Sx's-con't oxy and O2.    8/1- stopped since wasn't using.  14. H/o depression/anxiety: Continue Lamictal, Prozac with Klonopin prn. 15. Chronic insomnia: Managed with home dose trazodone. 16. Dyslipidemia: Trig- 636, direct LDL-99.6, HDL 29, Chol 202. On Zetia--to start Vespc 17. Vitamin B 12 deficiency: B12- 169 (was WNL a year ago) 18. NASH/Fatty liver: LFTs reviewed and normalized 19. Dense left sided hemiplegia: [place] regarding NMES- started trials Friday. They have TENS unit at home.  20. Hypokalemia: supplement 44mq 7/7 and repeat BMP reviewed, K+ improved to 3.8. Resolved. D/c daily supplement.  21. Leukocytosis: trending upward potentially secondary to aspiration pneumonia  7/18 still some elevation today   -afebrile, no s/s on exam   -monitor serially   -hospitalist following 22. Vitamin D deficiency: continue ergocalciferol 50,000U weekly for 7 weeks.  23.  Hypoxia ,resolved  24. AKI: resolved. Repeat Creatinine monday 25. Poor perfusion of LLE  7/17- per IM, LLE is colder than RLE_ by a lot- and no DP pulse palpable- Vascular said con't current regimen and monitor closely for low perfusion injury. 7/18 leg feels warmer (by comparative accounts). Normal appearing   -recent ABI 0.46 Consider nitroglycerin patch to improve blood flow, but may lower BP and worsen headaches.  Can consider outpatient Qutenza patch for her pain from claudication 26. Left  post-stroke shoulder pain: d/c lidocaine patch and ordered voltaren gel. Apply kinesiology tape. Continue to use brace as needed.  244 S/p fall- lac to left knee, Head CT negative- reviewed with patient and husband. Please use Stedy for all transfers.  28. Hypokalemia: continue daily 246m supplement 29. Spasticity: patient developing spasticity of LUE in spite of beginning flaccidity- don't think needs Baclofen as of yet - suggest ROM 2-3x/day esp of L shoulder and elbow- she is very diligent about this and spasticity is improving 30. Disposition: Lives with husband and daughter (autistic but high functioning)- husband very involved and has already coordinated with his boss to take time off to care for her. HFU scheduled. Daughter doing very well with family training. Will provide mother-in-law with list of all medications that she is on and their indications.     LOS: 30 days A FACE TO FABurdetteaulkar 07/28/2021, 11:25 AM

## 2021-07-28 NOTE — Progress Notes (Signed)
Occupational Therapy Session Note  Patient Details  Name: Erika [name] MRN: [id number] Date of Birth: 08/30/1971  Today's Date: 07/28/2021 OT Individual Time: 1030-1100 OT Individual Time Calculation (min): 30 min (unattended estim 1100-1200)   Short Term Goals: Week 4:  OT Short Term Goal 1 (Week 4): STGs = LTGs  Skilled Therapeutic Interventions/Progress Updates:    Pt received in recliner. Due to short session, did not due ADLs but pt stated she was already dressed in clean clothing.    Worked on LUE NMR with attempts to elicit active movement but really no active response except for trace in scapula.  Placed saebo stim one on L forearm for estim for wrist and finger extensors and bioness on L sh (deltoid and suprapinatus) for muscular facilitation. Pt tolerated stimulation well and had pt focus on her arm as if she was moving it herself.   Due to pt tolerating it well, left both on for unattended estim of 60 minutes.   Pt did not have any adverse affects but no response from estim after it was removed.  Provided pt with hand out to give her spouse on how to order the [employer] sling should they choose to use that at home. Pt could also use a cross body bag and place hand in bag.    Pt resting in recliner with alarm on and all needs met.   Therapy Documentation Precautions:  Precautions Precautions: Fall Precaution Comments: L hemiplegia Restrictions Weight Bearing Restrictions: No LUE Weight Bearing: Weight bearing as tolerated LLE Weight Bearing: Weight bearing as tolerated    Vital Signs: Therapy Vitals Temp: 98.3 F (36.8 C) Temp Source: Oral Pulse Rate: 83 Resp: 17 BP: (!) 144/79 Patient Position (if appropriate): Sitting Oxygen Therapy SpO2: 97 % O2 Device: Room Air Pain: No c/o pain in OT session ADL: ADL Eating: Set up Grooming: Setup Upper Body Bathing: Contact guard Where Assessed-Upper Body Bathing: Shower Lower Body Bathing: Contact  guard Where Assessed-Lower Body Bathing: Shower Upper Body Dressing: Setup, Minimal cueing Lower Body Dressing: Moderate assistance Toileting: Moderate assistance Where Assessed-Toileting: Bedside Commode Toilet Transfer: Minimal assistance Toilet Transfer Method: Squat pivot Toilet Transfer Equipment: Drop arm bedside commode Walk-In Shower Transfer: Minimal assistance Walk-In Shower Transfer Method: Stand pivot, Ambulating (ambulated to shower, sq pivot out to wc) Walk-In Shower Equipment: Transfer tub bench, Grab bars   Therapy/Group: Individual Therapy  , 07/28/2021, 12:58 PM 

## 2021-07-28 NOTE — Consult Note (Signed)
Neuropsychological Consultation   Patient:   Erika [name]   DOB:   03-Sep-1971  MR Number:  [id number]  Location:  [facility] [facility] [place] [id number] [place] [address] Dept: [place]: [phone]           Date of Service:   07/28/2021  Start Time:   8 AM End Time:   9 AM  Provider/Observer:  Ilean Skill, Psy.D.       Clinical Neuropsychologist       Billing Code/Service: [id number]  Chief Complaint:    Erika [name] is a 50 year old female with history of CAD status post CABG, right CEA x2, type 2 diabetes, fatty liver, chronic pain, PTSD.  Patient was admitted on 06/24/2021 with reports of left-sided weakness and fall progressing to left facial weakness with dysarthria.  MRI brain done revealed right pontine infarction and areas of encephalomalacia with gliosis in the right parietal and occipital lobes.  Patient with long history of poorly controlled blood pressure with medications being adjusted.  Follow-up MRI on 7/5 due to neurological changes with dense left hemiplegia was negative for progression or new changes.  Once patient stabilized referred for CIR.  Reason for Service:  Patient has a past medical history that includes PTSD, depression and anxiety with mild hemiplegia.  Patient referred for neuropsychological consultation due to the above.  Below is HPI for the current admission.  HPI: Erika [name] is a [name] with history of CAD s/p CABG, R-CEA X 2, T2DM, fatty liver, chronic pain, PTSD who was admitted on 06/24/21 with reports of left sided weakness and fall progressing to left facial weakness with dysarthria. CTA/CT perfusion was negative for core infarct or penumbra and showed probable subacute right occipital infarct with petechial hemorrhage.  MRI brain done revealing acute right pontine perforator infarct and area of encephalomalacia with gliosis in   right parietal and occipital lobes. ASA added to home dose plavix with recommendations for life long use due to severity of intracranial atherosclerosis.   CTA chest was suboptimal study but negative for PE and showed patchy densities in RUL. She was started on IV unasyn due to concerns of Aspiration PNA but continued to have leucocytosis therefore blood cultures done 07/03 and pending. Dr. Rayann Heman consulted due to elevated troponin and felt that it was likely due to demand ischemia in setting of stroke and no evidence of ACS.  Patient with long history of  poorly controlled BP and medications being adjusted.  2D echo showed EF 60-65% with no wall abnormality. MRI brain repeated on 07/05 due to neurological changes with dense left hemiplegia and was negative for progression or new changes. She was noted to be hypotensive the day before and worsening of symptoms felt to be due to hypotension and neurology recommended SBP goal 140-180 range and work up ordered for reversible causes of delirium. She was started on Vitamin B12 and thiamine empirically. C/o left sided weakness.   Current Status:  Patient was alert and oriented upon entering the room sitting in her wheelchair.  Patient reports that she was very depressed initially with significant loss of motor function.  Patient denies any significant changes in other cognitive variables consistent with pontine infarction as primary etiological variable she is dealing with.  Patient having improvements in her left leg motor function and is able to stand and walk but she primarily moves her foot forward at her  hip.  Patient reports that she has had some improvements with her left arm but continues to have significant motor deficits in her left arm and hand.  Patient looking forward to discharge on this coming Tuesday.  Patient was supportive husband in fact some concerns that he is trying to do too much for her.  Talked about needing to adjust some of this for her as  she should be doing as much as she can after discharge as long as it can be safe and repeatable.  Patient denies any exacerbation of her PTSD symptoms and reports that her mood particular around depressive symptomatology has improved significantly since her early treatment post stroke.  Patient reports that she is continuing her home medications and they continue to be effective for her.  Behavioral Observation: Erika [name]  presents as a 50 y.o.-year-old Right Caucasian Female who appeared her stated age. her dress was Appropriate and she was Well Groomed and her manners were Appropriate to the situation.  her participation was indicative of Appropriate behaviors.  There were physical disabilities noted.  she displayed an appropriate level of cooperation and motivation.     Interactions:    Active Redirectable  Attention:   abnormal and attention span appeared shorter than expected for age  Memory:   within normal limits; recent and remote memory intact  Visuo-spatial:  not examined  Speech (Volume):  normal  Speech:   normal; normal  Thought Process:  Coherent and Relevant  Though Content:  WNL; not suicidal and not homicidal  Orientation:   person, place, time/date, and situation  Judgment:   Fair  Planning:   Fair  Affect:    Depressed  Mood:    Dysphoric  Insight:   Good  Intelligence:   normal   Medical History:   Past Medical History:  Diagnosis Date   Arthralgia of temporomandibular joint    CAD, multiple vessel    a. 06/2016 Cath: ostLM 40%, ostLAD 40%, pLAD 95%, ost-pLCx 60%, pLCx 95%, mLCx 60%, mRCA 95%, D2 50%, LVSF nl;  b. 07/2016 CABG x 4 (LIMA->LAD, VG->Diag, VG->OM, VG->RCA); c. 08/2016 Cath: 3VD w/ 4/4 patent grafts. LAD distal to LIMA has diff dzs->Med rx; d. 08/2020 Cath: 4/4 patent grafts, native 3VD. EF 55-65%-->Med Rx.   Carotid arterial disease ([place])    a. 07/2016 s/p R CEA; b. 02/2021 U/S: RICA 123456, LICA 123456.   Clotting disorder ([place])     Depression    Diastolic dysfunction    a. 06/2016 Echo: EF 50-55%, mild inf wall HK, GR1DD, mild MR, RV sys fxn nl, mildly dilated LA, PASP nl   Fatty liver disease, nonalcoholic Q000111Q   History of blood transfusion    with heart surgery   HLD (hyperlipidemia)    Labile hypertension    a. prior renal ngiogram negative for RAS in 03/2016; b. catecholamines and metanephrines normal, mildly elevated renin with normal aldosterone and normal ratio in 02/2016   Myocardial infarction [facility]) 2017   Obesity    PAD (peripheral artery disease) ([place])    a. 09/2018 s/p L SFA stenting; b. 07/2019 Periph Angio: Patent m/d L SFA stent w/ 100% L SFA distal to stent. L AT 100d, L Peroneal diff dzs-->Med Rx; c. 02/2021 ABIs: stable @ 0.61 on R and 0.46 on L.   PTSD (post-traumatic stress disorder)    Tobacco abuse    Type 2 diabetes mellitus ([place]) 12/2015         Patient Active Problem List  Diagnosis Date Noted   Chronic post-traumatic stress disorder (PTSD)    Depression with anxiety    Right pontine stroke ([place]) 06/28/2021   Stroke ([place]) 06/24/2021   Chronic diastolic CHF (congestive heart failure) ([place]) 06/24/2021   Fall 06/24/2021   Abnormal LFTs 06/24/2021   Acute respiratory failure with hypoxia (HCC) 06/24/2021   Olecranon bursitis of left elbow 06/12/2021   Chronic hip pain (Bilateral) 06/09/2021   Chronic use of opiate for therapeutic purpose 05/09/2021   Greater trochanteric bursitis of hip (Left) 03/09/2021   Bursitis of hip (Left) Q000111Q   Uncomplicated opioid dependence ([place]) 02/20/2021   Acute conjunctivitis of left eye 01/02/2021   Unintentional weight loss 11/21/2020   Broken teeth (Right) 08/02/2020   History of MI (myocardial infarction) (July 2017) 08/02/2020   Neuropathy 06/09/2020   Dental abscess 06/09/2020   Foot drop (Left) 06/09/2020   Carotid stenosis, asymptomatic, right 04/27/2020   Chronic migraine 11/03/2019   Pyelonephritis due to Escherichia coli 10/26/2019    Thrombocytosis 09/16/2019   Erythrocytosis 09/16/2019   Hypercalcemia 09/06/2019   Leukocytosis 09/06/2019   Polycythemia 09/06/2019   Chronic hip pain (Left) 08/27/2019   Osteoarthritis of hip (Left) 08/27/2019   Gluteal tendonitis of buttock (Left) 08/27/2019   Radial nerve palsy (Right) 07/15/2019   Neuropathy of radial nerve (Right) 06/30/2019   Abnormal bruising 06/25/2019   History of carotid endarterectomy (Right) 03/04/2019   Pain medication agreement signed 03/04/2019   Atypical facial pain (Right) 01/27/2019   Chronic ear pain (Right) 01/27/2019   Chronic jaw pain (Right) 01/27/2019   Geniculate Neuralgia (Right) 01/27/2019   Vitamin D deficiency 01/19/2019   Neurogenic pain 01/19/2019   Chronic anticoagulation (PLAVIX) 01/19/2019   Chronic pain syndrome 01/07/2019   Long term current use of opiate analgesic 01/07/2019   Long term prescription benzodiazepine use 01/07/2019   Pharmacologic therapy 01/07/2019   Disorder of skeletal system 01/07/2019   Problems influencing health status 01/07/2019   Chronic headaches (1ry area of Pain) (Right) 01/07/2019   Opiate use 09/24/2018   PVD (peripheral vascular disease) ([place]) 06/24/2018   Chronic ankle pain (Bilateral) 12/27/2017   Tendinopathy of gluteus medius (Right) 12/27/2017   Tendinopathy of gluteus medius (Left) 12/27/2017   Bilateral hip pain 12/26/2017   Chronic elbow pain (Left) 10/17/2017   Elevated troponin I level 05/21/2017   Severe sepsis ([place]) 05/19/2017   Major depressive disorder, recurrent episode, moderate (HCC) 11/19/2016   Insomnia 10/30/2016   Constipation 07/25/2016   S/P CABG x 4 07/06/2016   Bradycardia    CAD (coronary artery disease)    Carotid stenosis    CAD in native artery 06/29/2016   Elevated troponin 06/28/2016   Essential hypertension, malignant 06/28/2016   Tobacco abuse 06/28/2016   Essential hypertension    Malignant hypertension    Type 2 diabetes mellitus with other specified  complication (HCC)    Chest pain with high risk for cardiac etiology 06/27/2016   NSTEMI (non-ST elevated myocardial infarction) ([place]) 06/27/2016   Proteinuria 03/15/2016   Renal artery stenosis ([place]) 03/15/2016   MDD (major depressive disorder) 10/17/2015   Agoraphobia with panic attacks 04/25/2015   HTN (hypertension), malignant 10/20/2013   Cluster headache 03/20/2012   HLD (hyperlipidemia) 07/26/2010   ADJUSTMENT DISORDER WITH MIXED FEATURES 07/26/2010   Psychiatric History:  Past history of PTSD, depression with anxiety.  Patient reports that she has not had an exacerbation of her chronic PTSD symptoms.  Family Med/Psych History:  Family History  Adopted: Yes  Problem Relation Age of Onset   Diabetes Mother    Diabetes Father    Alcohol abuse Father    Heart disease Father    Drug abuse Father    Stroke Sister    Anxiety disorder Sister     Risk of Suicide/Violence: virtually non-existent patient denies any suicidal or homicidal ideation.  Impression/DX:  Erika [name] is a 50 year old female with history of CAD status post CABG, right CEA x2, type 2 diabetes, fatty liver, chronic pain, PTSD.  Patient was admitted on 06/24/2021 with reports of left-sided weakness and fall progressing to left facial weakness with dysarthria.  MRI brain done revealed right pontine infarction and areas of encephalomalacia with gliosis in the right parietal and occipital lobes.  Patient with long history of poorly controlled blood pressure with medications being adjusted.  Follow-up MRI on 7/5 due to neurological changes with dense left hemiplegia was negative for progression or new changes.  Once patient stabilized referred for CIR.  Patient was alert and oriented upon entering the room sitting in her wheelchair.  Patient reports that she was very depressed initially with significant loss of motor function.  Patient denies any significant changes in other cognitive variables consistent with  pontine infarction as primary etiological variable she is dealing with.  Patient having improvements in her left leg motor function and is able to stand and walk but she primarily moves her foot forward at her hip.  Patient reports that she has had some improvements with her left arm but continues to have significant motor deficits in her left arm and hand.  Patient looking forward to discharge on this coming Tuesday.  Patient was supportive husband in fact some concerns that he is trying to do too much for her.  Talked about needing to adjust some of this for her as she should be doing as much as she can after discharge as long as it can be safe and repeatable.  Patient denies any exacerbation of her PTSD symptoms and reports that her mood particular around depressive symptomatology has improved significantly since her early treatment post stroke.  Patient reports that she is continuing her home medications and they continue to be effective for her.  Disposition/Plan:  Patient coping with significant left-sided motor functions but improving left leg to the point of being able to stand.  Patient reports that her mood particular and initial depressive symptomatology has improved over the past 2 weeks.  Today we worked on coping and adjustment issues and assessed recurrence or worsening of PTSD which has remained stable without exacerbation.  Diagnosis:    Right pontine stroke ([place]) - Plan: Ambulatory referral to Physical Medicine Rehab  Leukocytosis - Plan: DG Chest 2 View, DG Chest 2 View  Hypoxia - Plan: DG Chest 2 View, DG Chest 2 View, DG Chest 2 View, DG Chest 2 View  SOB (shortness of breath) - Plan: DG CHEST PORT 1 VIEW, DG CHEST PORT 1 VIEW  Pneumonia - Plan: DG CHEST PORT 1 VIEW, DG CHEST PORT 1 VIEW         Electronically Signed   _______________________ Ilean Skill, Psy.D. Clinical Neuropsychologist

## 2021-07-29 LAB — GLUCOSE, CAPILLARY
Glucose-Capillary: 73 mg/dL (ref 70–99)
Glucose-Capillary: 94 mg/dL (ref 70–99)
Glucose-Capillary: 167 mg/dL — ABNORMAL HIGH (ref 70–99)
Glucose-Capillary: 187 mg/dL — ABNORMAL HIGH (ref 70–99)

## 2021-07-29 NOTE — Progress Notes (Signed)
Physical Therapy Session Note  Patient Details  Name: Erika [name] MRN: [id number] Date of Birth: 02/20/1971  Today's Date: 07/29/2021 PT Individual Time: 0900-1000 PT Individual Time Calculation (min): 60 min   Short Term Goals: Week 3:  PT Short Term Goal 1 (Week 3): Pt will complete bed mobility with minA PT Short Term Goal 1 - Progress (Week 3): Not met (requires modA) PT Short Term Goal 2 (Week 3): Pt will complete bed<>chair transfers with minA and LRAD PT Short Term Goal 2 - Progress (Week 3): Partly met (performance fluctuates daily b/w minA to modA) PT Short Term Goal 3 (Week 3): Pt will ambulate 41f with modA of 1 person and LRAD PT Short Term Goal 3 - Progress (Week 3): Met PT Short Term Goal 4 (Week 3): Husband will participate in hands on training PT Short Term Goal 4 - Progress (Week 3): Partly met (Needs further training) PT Short Term Goal 5 (Week 3): Pt will initiate stair training PT Short Term Goal 5 - Progress (Week 3): Not met (deferred due to safety concerns. Husband will be getting a ramp to enter home)  Skilled Therapeutic Interventions/Progress Updates: Pt presents sitting in w/c and agreeable to therapy.  Pt wheeled to main gym w/ R extremities and cga, but slowly and verbal cues for synchronizing.  Pt performed multiple sit to stand transfers w/ min A and occasional cueing for proper LLE placement as well as forward lean.  Pt also requires cueing for scooting back into w/c.  Pt performed gait training w/ HW and min A x 32' x 3.  Pt requires minimal facilitation for weight shift to R for advancement of LLE.  Pt educated on safe approach to seat, allowing for HW. Pt returned to room and remained sitting in w/c w/ chair alarm on and all needs in reach.     Therapy Documentation Precautions:  Precautions Precautions: Fall Precaution Comments: L hemiplegia Restrictions Weight Bearing Restrictions: No LUE Weight Bearing: Weight bearing as tolerated LLE  Weight Bearing: Weight bearing as tolerated General:   Vital Signs:  Pain:0/10 initially, 2/10 R LE w/ activity, but has received pain meds prior to therapy. Pain Assessment Pain Scale: 0-10 Pain Score: 7  Pain Type: Chronic pain Pain Location: Head Pain Orientation: Anterior Pain Descriptors / Indicators: Headache Pain Frequency: Constant Pain Onset: On-going Pain Intervention(s): Medication (See eMAR)     Therapy/Group: Individual Therapy  JLadoris Gene8/05/2021, 10:00 AM

## 2021-07-29 NOTE — Progress Notes (Signed)
Physical Therapy Session Note  Patient Details  Name: Erika [name] MRN: [id number] Date of Birth: 09/13/1971  Today's Date: 07/29/2021 PT Individual Time: 0800-0858 PT Individual Time Calculation (min): 58 min   Short Term Goals: Week 1:  PT Short Term Goal 1 (Week 1): Pt will perform bed mobility with minA. PT Short Term Goal 1 - Progress (Week 1): Met PT Short Term Goal 2 (Week 1): Pt will perform sit to stand transfer with minA. PT Short Term Goal 2 - Progress (Week 1): Met PT Short Term Goal 3 (Week 1): Pt perform bed to chair transfer with minA. PT Short Term Goal 3 - Progress (Week 1): Not met PT Short Term Goal 4 (Week 1): Pt will ambulate x25' with modA. PT Short Term Goal 4 - Progress (Week 1): Not met  Skilled Therapeutic Interventions/Progress Updates:  Pt was seen bedside in the am, sitting up in w/c. Pt propelled w/c 100 feet with R UE and LE with S and increased time with occasional verbal cues. Pt performed multiple sit to stand transfers and stand pivot transfers with hemiwalker and c/g to min A with verbal cues. Pt ambulated 26 feet and 13 feet x 2 with hemiwalker and min A with verbal cues. Pt performed STS transfers 3 sets x 5 reps each with c/g to min A and verbal cues. Pt propelled w/c back towards room with S and occasional verbal cues with increased time. Pt left sitting up in w/c in room with all needs within reach.   Therapy Documentation Precautions:  Precautions Precautions: Fall Precaution Comments: L hemiplegia Restrictions Weight Bearing Restrictions: No LUE Weight Bearing: Weight bearing as tolerated LLE Weight Bearing: Weight bearing as tolerated General:   Vital Signs:  Pain: Pt c/s 7/10 pain headache, medicated by nursing prior to treatment.    Therapy/Group: Individual Therapy  Dub Amis 07/29/2021, 11:06 AM

## 2021-07-29 NOTE — Progress Notes (Signed)
Patient slept well throughout the night. Oxycodone given x1 for c/o headache-effective. No issues noted throughout the night. Refused miralax at hs. Last documented bowel movement is 8/2, however pt insists she had a bowel movement 8/4. Abdomen soft and non distended, bowels sounds positive. Will continue to monitor.

## 2021-07-29 NOTE — Progress Notes (Signed)
Physical Therapy Session Note  Patient Details  Name: Erika [name] MRN: [id number] Date of Birth: [birth year]  Today's Date: 07/29/2021 PT Individual Time: 8614-8307 PT Individual Time Calculation (min): 43 min   Short Term Goals: Week 1:  PT Short Term Goal 1 (Week 1): Pt will perform bed mobility with minA. PT Short Term Goal 1 - Progress (Week 1): Met PT Short Term Goal 2 (Week 1): Pt will perform sit to stand transfer with minA. PT Short Term Goal 2 - Progress (Week 1): Met PT Short Term Goal 3 (Week 1): Pt perform bed to chair transfer with minA. PT Short Term Goal 3 - Progress (Week 1): Not met PT Short Term Goal 4 (Week 1): Pt will ambulate x25' with modA. PT Short Term Goal 4 - Progress (Week 1): Not met  Skilled Therapeutic Interventions/Progress Updates:  Pt was seen bedside in the pm. Pt performed bed mobility with min to mod A with verbal cues. Pt performed sit to stand transfers with min A and verbal cues. Pt transferred bed to chair with mod A and verbal cues. Performed multiple sit to stand transfers with min A and verbal cues. Pt returned to room and left sitting up in bed with all needs within reach.   Therapy Documentation Precautions:  Precautions Precautions: Fall Precaution Comments: L hemiplegia Restrictions Weight Bearing Restrictions: No LUE Weight Bearing: Weight bearing as tolerated LLE Weight Bearing: Weight bearing as tolerated General:   Pain: Pain Assessment Pain Scale: 0-10 Pain Score: 0-No pain  Therapy/Group: Individual Therapy  Dub Amis 07/29/2021, 3:35 PM

## 2021-07-29 NOTE — Progress Notes (Signed)
Speech Language Pathology Daily Session Note  Patient Details  Name: Erika [name] MRN: [id number] Date of Birth: [birth year]  Today's Date: 07/29/2021 SLP Individual Time: 1345-1430 SLP Individual Time Calculation (min): 45 min  Short Term Goals: Week 5: SLP Short Term Goal 1 (Week 5): STGs=LTGs due to ELOS  Skilled Therapeutic Interventions: Patient agreeable to skilled ST intervention with focus on cognitive goals. SLP facilitated session by providing sup A verbal cues for alternating attention and mildly complex verbal reasoning. Patient required intermittent verbal reminders for recall and alternating attention between different points as task and complexity progressed. Patient observed implementing speech intelligibility at independent level during conversation with occasional reminders to speak louder and over-articulate. Patient was left in bed with alarm activated and immediate needs within reach at end of session. Continue per current plan of care.      Pain Pain Assessment Pain Scale: 0-10 Pain Score: 0-No pain  Therapy/Group: Individual Therapy  Patty Sermons 07/29/2021, 1:54 PM

## 2021-07-29 NOTE — Progress Notes (Signed)
PROGRESS NOTE   Subjective/Complaints: Pt in a good mood. Happy that she's getting to go home on 8/9. Appreciates all the members of the team. Appreciated neuropsych follow up  ROS: Patient denies fever, rash, sore throat, blurred vision, nausea, vomiting, diarrhea, cough, shortness of breath or chest pain, joint or back pain .    Objective:   No results found. No results for input(s): WBC, HGB, HCT, PLT in the last 72 hours.   No results for input(s): NA, K, CL, CO2, GLUCOSE, BUN, CREATININE, CALCIUM in the last 72 hours.    Intake/Output Summary (Last 24 hours) at 07/29/2021 1211 Last data filed at 07/29/2021 0924 Gross per 24 hour  Intake 780 ml  Output --  Net 780 ml         Physical Exam: Vital Signs Blood pressure (!) 149/72, pulse 78, temperature 97.7 F (36.5 C), temperature source Oral, resp. rate 18, height '5\' 6"'$  (1.676 m), weight 83.7 kg, SpO2 98 %. Constitutional: No distress . Vital signs reviewed. HEENT: NCAT, EOMI, oral membranes moist Neck: supple Cardiovascular: RRR without murmur. No JVD    Respiratory/Chest: CTA Bilaterally without wheezes or rales. Normal effort    GI/Abdomen: BS +, non-tender, non-distended Ext: no clubbing, cyanosis, or edema Psych: pleasant and cooperative  Neurological: LUE MAS of 1+ to 2 in L shoulder/L elbow; 1+ in L hand/wrist.---stable   Neurological:    Alert and oriented x 3. Normal insight and awareness. Intact Memory. Normal language and speech.      Comments: Left facial weakness with mild dysarthria. Able to answer orientation questions and follow commands without difficulty.  0/5 LUE and trace-1/5 prox to 0/5 distally LLE 4/5 on Right side    Assessment/Plan: 1. Functional deficits which require 3+ hours per day of interdisciplinary therapy in a comprehensive inpatient rehab setting. Physiatrist is providing close team supervision and 24 hour management of  active medical problems listed below. Physiatrist and rehab team continue to assess barriers to discharge/monitor patient progress toward functional and medical goals  Care Tool:  Bathing    Body parts bathed by patient: Abdomen, Chest, Left arm, Right arm, Front perineal area, Right upper leg, Left upper leg, Right lower leg, Left lower leg, Face   Body parts bathed by helper: Buttocks     Bathing assist Assist Level: Minimal Assistance - Patient > 75%     Upper Body Dressing/Undressing Upper body dressing   What is the patient wearing?: Pull over shirt    Upper body assist Assist Level: Minimal Assistance - Patient > 75%    Lower Body Dressing/Undressing Lower body dressing      What is the patient wearing?: Pants     Lower body assist Assist for lower body dressing: Minimal Assistance - Patient > 75%     Toileting Toileting    Toileting assist Assist for toileting: Minimal Assistance - Patient > 75%     Transfers Chair/bed transfer  Transfers assist     Chair/bed transfer assist level: Minimal Assistance - Patient > 75%     Locomotion Ambulation   Ambulation assist      Assist level: Minimal Assistance - Patient > 75% Assistive  device: Walker-hemi Max distance: 25   Walk 10 feet activity   Assist     Assist level: Minimal Assistance - Patient > 75% Assistive device: Walker-hemi   Walk 50 feet activity   Assist Walk 50 feet with 2 turns activity did not occur: Safety/medical concerns  Assist level: Dependent - Patient 0% Assistive device: Lite Gait    Walk 150 feet activity   Assist Walk 150 feet activity did not occur: Safety/medical concerns         Walk 10 feet on uneven surface  activity   Assist Walk 10 feet on uneven surfaces activity did not occur: Safety/medical concerns         Wheelchair     Assist Will patient use wheelchair at discharge?: Yes Type of Wheelchair: Manual    Wheelchair assist level:  Supervision/Verbal cueing Max wheelchair distance: 100    Wheelchair 50 feet with 2 turns activity    Assist        Assist Level: Supervision/Verbal cueing   Wheelchair 150 feet activity     Assist      Assist Level: Supervision/Verbal cueing   Blood pressure (!) 149/72, pulse 78, temperature 97.7 F (36.5 C), temperature source Oral, resp. rate 18, height '5\' 6"'$  (1.676 m), weight 83.7 kg, SpO2 98 %.    Medical Problem List and Plan: 1.  Right pontine stroke             -patient may shower             -ELOS: 8/9 extended due to upgrade of goals to supervision so elderly caregiver can assist  pt forgot about new date , she then recalled the change   -Continue CIR therapies including PT, OT, and SLP - Estim patch to finger and wrist extensors  -grounds pass prn  2.  Impaired mobility: Continue Lovenox             -antiplatelet therapy: Continue ASA/Plavix 3. Cluster headaches and migraines: continue abortive oxygen PRN. Decrease oxycodone to BID prn for HA. Discussed outpatient Botox- patient would like to try. D/c periactin and phenergan as patient not using 4. Mood: Team to provide ego support. LCSW to follow for evaluation and support.             -antipsychotic agents: Lamictal daily.  -neuropsych input appreciated!  5. Neuropsych: This patient is capable of making decisions on her own behalf. 6. Skin/Wound Care: Routine pressure relief measures. 7. Fluids/Electrolytes/Nutrition: Monitor I/O. Lytes reviewed and stable 8. HTN: SBP goal 140-180 range to avoid hypoperfusion of brain. Per husband "acts drunk when BP is low" --At goal, continue Imdur, Coreg, aldactone, Demadex,  Vitals:   07/28/21 1916 07/29/21 0447  BP: (!) 144/75 (!) 149/72  Pulse: 73 78  Resp: 18 18  Temp: 98.1 F (36.7 C) 97.7 F (36.5 C)  SpO2: 97% 98%  D/c'ed coreg, fair control 8/6 9. T2DM: Hgb A1C- 10.7. BS run 200's at home. Dietary education/reinforcement.             Continue  decreased Lantus to 22U BID CBG (last 3)  Recent Labs    07/28/21 1958 07/29/21 0612 07/29/21 1146  GLUCAP 191* 73 94   10. CAD s/p CABG: On ASA, coreg, Cozaar, Demadex, ASA, 11. PAD w/claudication: Has been smoking 1 PPD --quit at admission. Continue Nicotine patch. --On lyrica BID to manage symptoms.   12. Aspiration PNA: CT reviewed, appreciate medicine consult,  Resolved off abx may d/c Midline 13.  Chronic cluster headaches, currently well controlled: Managed with thorazine as well as oxycodone and high flow oxygen prn. Decreased oxycodone to TID PRN and edited order to indicate it should be given after therapy as per husband's request- makes her too sedated to tolerate therapy. 7/16- added Periactin prn for associated Sx's-con't oxy and O2.    8/1- stopped since wasn't using.  14. H/o depression/anxiety: Continue Lamictal, Prozac with Klonopin prn. 15. Chronic insomnia: Managed with home dose trazodone. 16. Dyslipidemia: Trig- 636, direct LDL-99.6, HDL 29, Chol 202. On Zetia--to start Vespc 17. Vitamin B 12 deficiency: B12- 169 (was WNL a year ago) 18. NASH/Fatty liver: LFTs reviewed and normalized 19. Dense left sided hemiplegia: [place] regarding NMES- started trials Friday. They have TENS unit at home.  20. Hypokalemia: supplement 37mq 7/7 and repeat BMP reviewed, K+ improved to 3.8. Resolved. D/c daily supplement.  21. Leukocytosis: resolved 22. Vitamin D deficiency: continue ergocalciferol 50,000U weekly for 7 weeks.  23.  Hypoxia ,resolved  24. AKI: resolved. Repeat Creatinine monday 25. Poor perfusion of LLE  7/17- per IM, LLE is colder than RLE_ by a lot- and no DP pulse palpable- Vascular said con't current regimen and monitor closely for low perfusion injury. 7/18 leg feels warmer (by comparative accounts). Normal appearing   -recent ABI 0.46 Consider nitroglycerin patch to improve blood flow, but may lower BP and worsen headaches.  Can consider outpatient  Qutenza patch for her pain from claudication 26. Left post-stroke shoulder pain: d/c lidocaine patch and ordered voltaren gel. Apply kinesiology tape. Continue to use brace as needed.  271 S/p fall- lac to left knee, Head CT negative- reviewed with patient and husband. Please use Stedy for all transfers.  28. Hypokalemia: continue daily 282m supplement 29. Spasticity: patient developing spasticity of LUE in spite of beginning flaccidity- don't think needs Baclofen as of yet - suggest ROM 2-3x/day esp of L shoulder and elbow- she is very diligent about this and spasticity is improving 30. Disposition: Lives with husband and daughter (autistic but high functioning)- husband very involved and has already coordinated with his boss to take time off to care for her. HFU scheduled. Daughter doing very well with family training. Will provide mother-in-law with list of all medications that she is on and their indications.     [place]: [place]/05/2021, 12:11 PM

## 2021-07-30 LAB — GLUCOSE, CAPILLARY
Glucose-Capillary: 98 mg/dL (ref 70–99)
Glucose-Capillary: 143 mg/dL — ABNORMAL HIGH (ref 70–99)
Glucose-Capillary: 229 mg/dL — ABNORMAL HIGH (ref 70–99)
Glucose-Capillary: 144 mg/dL — ABNORMAL HIGH (ref 70–99)

## 2021-07-31 LAB — GLUCOSE, CAPILLARY
Glucose-Capillary: 98 mg/dL (ref 70–99)
Glucose-Capillary: 133 mg/dL — ABNORMAL HIGH (ref 70–99)
Glucose-Capillary: 95 mg/dL (ref 70–99)
Glucose-Capillary: 183 mg/dL — ABNORMAL HIGH (ref 70–99)

## 2021-07-31 LAB — BASIC METABOLIC PANEL
Anion gap: 8 (ref 5–15)
BUN: 15 mg/dL (ref 6–20)
CO2: 31 mmol/L (ref 22–32)
Calcium: 10.1 mg/dL (ref 8.9–10.3)
Chloride: 99 mmol/L (ref 98–111)
Creatinine, Ser: 1.01 mg/dL — ABNORMAL HIGH (ref 0.44–1.00)
GFR, Estimated: >60 mL/min (ref >60)
Glucose, Bld: 103 mg/dL — ABNORMAL HIGH (ref 70–99)
Potassium: 3.8 mmol/L (ref 3.5–5.1)
Sodium: 138 mmol/L (ref 135–145)

## 2021-07-31 LAB — CBC
HCT: 37.4 % (ref 36.0–46.0)
Hemoglobin: 12.7 g/dL (ref 12.0–15.0)
MCH: 30.7 pg (ref 26.0–34.0)
MCHC: 34 g/dL (ref 30.0–36.0)
MCV: 90.3 fL (ref 80.0–100.0)
Platelets: 325 10*3/uL (ref 150–400)
RBC: 4.14 MIL/uL (ref 3.87–5.11)
RDW: 13.3 % (ref 11.5–15.5)
WBC: 8.9 10*3/uL (ref 4.0–10.5)
nRBC: 0 % (ref 0.0–0.2)

## 2021-07-31 NOTE — Progress Notes (Signed)
Physical Therapy Session Note  Patient Details  Name: Erika [name] MRN: [id number] Date of Birth: 07-14-1971  Today's Date: 07/31/2021 PT Group Time: 1100-1155 PT Group Time Calculation (min): 55 min  Short Term Goals: Week 5:  PT Short Term Goal 1 (Week 5): STG = LTG due to ELOS  Skilled Therapeutic Interventions/Progress Updates:      Pt engaged in therapeutic w/c level activities in group setting, focused on UE/LE strengthening, activity tolerance, social engagement. Pt was guided through various exercises with music involving upper extremities, lower extremities, and trunk/core activation. All music was selected by group members. Adaptations made when appropriate to accommodate for deficits. With activities that involved her flaccid L arm, repeated repetitions on R for muscle strengthening. She did well with openly discussing her deficits and progress she had made with therapy during the session. Pt with appropriate sustained participation throughout session. Rehab tech assisted pt with returning to room at end of session.   Therapy Documentation Precautions:  Precautions Precautions: Fall Precaution Comments: L hemiplegia Restrictions Weight Bearing Restrictions: No LUE Weight Bearing: Non weight bearing LLE Weight Bearing: Weight bearing as tolerated General:    Therapy/Group: Individual Therapy  [name] 07/31/2021, 7:41 AM

## 2021-07-31 NOTE — Progress Notes (Signed)
Speech Language Pathology Discharge Summary  Patient Details  Name: Erika [name] MRN: [id number] Date of Birth: May 07, 1971  Today's Date: 07/31/2021 SLP Individual Time: 1420-1447 SLP Individual Time Calculation (min): 27 min   Skilled Therapeutic Interventions:  Skilled ST services focused on cognitive skills. SLP facilitated reassessment of cognitive linguistic skills utilizing formal assessment Cognistat. Pt scored WFL on all subsections, execpt for mild deficits in construction task and calculation. Pt demonstrated difficulty with following 3 step directions, recalled 3 out 4 words and required supervision A verbal cues for error awareness. SLP provided education pertaining to mildly-complex problem solving, error awareness, and alternating attention deficits with need to continue outpatient ST services, pt agreed and all questions answered to satisfaction. Pt was left in room with call bell within reach and chair alarm set. SLP recommends to continue skilled services.    Patient has met 7 of 7 long term goals.  Patient to discharge at overall Modified Independent;Supervision;Min level.  Reasons goals not met:     Clinical Impression/Discharge Summary:   Pt made great progress meeting 7 out 7 goals. Pt has been upgraded to regular textures and thin liquid diet with mod I for use of swallow strategies. Pt demonstrated improvement in selective then alternating attention, emergent/anticipatory awareness, mildly-complex problem solving, reading comprehension at paragraph level and short term recall during functional tasks. Pt demonstrates good carryover with taught strategies and education was completed over multiple sessions with pt's husband. Pt benefited from skilled ST services in order to maximize functional independence and reduce burden of care, requiring supervision at discharge with continued skilled outpatient ST services.   Care Partner:  Caregiver Able to Provide Assistance:  Yes  Type of Caregiver Assistance: Physical;Cognitive  Recommendation:  24 hour supervision/assistance;Outpatient SLP  Rationale for SLP Follow Up: Maximize cognitive function and independence;Maximize functional communication;Reduce caregiver burden   Equipment: [name]   Reasons for discharge: Discharged from hospital   Patient/Family Agrees with Progress Made and Goals Achieved: Yes    Erika [name] 07/31/2021, 4:05 PM

## 2021-07-31 NOTE — Progress Notes (Signed)
Inpatient Rehabilitation Care Coordinator Discharge Note  The overall goal for the admission was met for:   Discharge location: [facility]  Length of Stay: Yes-34 DAYS  Discharge activity level: Yes-CGA-MIN LEVEL  Home/community participation: Yes  Services provided included: MD, RD, PT, OT, SLP, RN, CM, TR, Pharmacy, Neuropsych, and SW  Financial Services: Medicare and Private Insurance: Haematologist offered to/list presented to:PT AND HUSBAND  Follow-up services arranged: Outpatient: [facility]-PT,OT,SP WILL CALL HUSBAND TO SET UP APPOINTMENTS, DME: [facility], DROP-ARM BEDSIDE [facility] FAMILY TO GET HEMI-WALKER ON OWN, and Patient/Family has no preference for HH/DME agencies SOMEHOW 3 IN 1 AND ARM TROUGH DISAPPEARED BEFORE GETTING INTO PT'S ROOM. RN DIRECTOR AWARE AND HAVE RE-ORDERED 3 IN 1 AND ARM TROUGH TO BE DELIVERED TO THE HOME AS NOT TO HOLD UP DISCHARGE TODAY  Comments (or additional information):HUSBAND AND DAUGHTER ALONG WITH MOTHER IN-LAW WERE HERE FOR FAMILY TRAINING AND IT WENT WELL. ALL FEEL PREPARED FOR DISCHARGE HOME  Patient/Family verbalized understanding of follow-up arrangements: Yes  Individual responsible for coordination of the follow-up plan: [name]-HUSBAND [phone]  Confirmed correct DME delivered: Elease Hashimoto 07/31/2021    Elease Hashimoto

## 2021-07-31 NOTE — Discharge Summary (Signed)
Physical Therapy Discharge Summary  Patient Details  Name: Erika [name] MRN: [id number] Date of Birth: 05/17/1971  Today's Date: 07/31/2021 PT Individual Time: 0800-0900 PT Individual Time Calculation (min): 60 min    Patient has met 5 of 7 long term goals due to improved activity tolerance, improved balance, improved postural control, increased strength, increased range of motion, ability to compensate for deficits, functional use of  left lower extremity, and improved attention.  Patient to discharge at a wheelchair level [place].   Patient's care partner is independent to provide the necessary physical and cognitive assistance at discharge.  Reasons goals not met: Bed mobility and transfer goals set to minA. Pt required modA for bed mobility and transfers fluctuated b/w minA and modA depending on fatigue, effort, and attention, and setup. Thorough family training has been completed throughout her rehab stay with husband, daughter, and mother in law.  Recommendation:  Patient will benefit from ongoing skilled PT services in outpatient setting to continue to advance safe functional mobility, address ongoing impairments in L hemibody weakness, functional transfers, standing balance, and gait training in order to minimize fall risk.  Equipment: 20x16 wheelchair. Husband purchased hemi-walker.  Reasons for discharge: treatment goals met and discharge from hospital  Patient/family agrees with progress made and goals achieved: Yes  PT Discharge Precautions/Restrictions Precautions Precautions: Fall Precaution Comments: L hemiplegia Restrictions Weight Bearing Restrictions: No Pain Pain Assessment Pain Scale: Faces Pain Type: Chronic pain Pain Location: Head Pain Descriptors / Indicators: Headache Pain Onset: On-going Pain Intervention(s): Relaxation;Ambulation/increased activity;Repositioned;Emotional support Vision/Perception  Vision - Assessment Eye Alignment: Within  Functional Limits Ocular Range of Motion: Within Functional Limits Perception Perception: Within Functional Limits Praxis Praxis: Intact  Cognition Overall Cognitive Status: Within Functional Limits for tasks assessed Arousal/Alertness: Awake/alert Orientation Level: Oriented X4 Attention: Sustained;Focused Focused Attention: Appears intact Sustained Attention: Appears intact Awareness: Appears intact Problem Solving: Impaired Safety/Judgment: Impaired Sensation Sensation Light Touch: Appears Intact Hot/Cold: Appears Intact Proprioception: Impaired by gross assessment Additional Comments: Intact sensation on LUE and LLE. Intact proprioception with LUE. Impaired proprioception on LLE. Coordination Gross Motor Movements are Fluid and Coordinated: No Fine Motor Movements are Fluid and Coordinated: No Coordination and Movement Description: L hemi (LUE>LLE) Heel Shin Test: UTA 2/2 LLE weakness Motor  Motor Motor: Hemiplegia Motor - Discharge Observations: LUE > LLE  Mobility Bed Mobility Bed Mobility: Supine to Sit;Sit to Supine Supine to Sit: Moderate Assistance - Patient 50-74% Sit to Supine: Moderate Assistance - Patient 50-74% Transfers Transfers: Sit to Stand;Stand to Sit;Stand Pivot Transfers;Squat Pivot Transfers Sit to Stand: Contact Guard/Touching assist Stand to Sit: Contact Guard/Touching assist Stand Pivot Transfers: Moderate Assistance - Patient 50 - 74% Stand Pivot Transfer Details: Verbal cues for precautions/safety;Verbal cues for sequencing;Tactile cues for posture;Tactile cues for sequencing;Tactile cues for placement;Tactile cues for initiation;Manual facilitation for weight shifting Squat Pivot Transfers: Minimal Assistance - Patient > 75%;Moderate Assistance - Patient 50-74% Transfer (Assistive device): 1 person hand held assist Locomotion  Gait Ambulation: Yes Gait Assistance: Minimal Assistance - Patient > 75% Gait Distance (Feet): 35 Feet Assistive  device: Hemi-walker Gait Assistance Details: Tactile cues for posture;Tactile cues for sequencing;Verbal cues for precautions/safety;Verbal cues for sequencing;Manual facilitation for weight shifting;Tactile cues for weight shifting Gait Gait: Yes Gait Pattern: Impaired Gait Pattern: Step-to pattern;Decreased step length - right;Decreased step length - left;Decreased stance time - left;Decreased weight shift to left;Decreased dorsiflexion - left;Decreased hip/knee flexion - left;Left flexed knee in stance;Poor foot clearance - left Gait velocity: decreased High Level Ambulation  High Level Ambulation: Side stepping Side Stepping: modA with UE support Stairs / Additional Locomotion Stairs: No (safety concerns) Product manager Mobility: Yes Wheelchair Assistance: Chartered loss adjuster: Right upper extremity;Right lower extremity Wheelchair Parts Management: Needs assistance Distance: 150'  Trunk/Postural Assessment  Cervical Assessment Cervical Assessment: Within Functional Limits Thoracic Assessment Thoracic Assessment: Exceptions to [facility] (rounded shoulders) Lumbar Assessment Lumbar Assessment: Exceptions to WFL (rounded shulders) Postural Control Postural Control: Within Functional Limits  Balance Balance Balance Assessed: Yes Standardized Balance Assessment Standardized Balance Assessment: Timed Up and Go Test Timed Up and Go Test TUG: Normal TUG Normal TUG (seconds): 180 Static Sitting Balance Static Sitting - Balance Support: Feet supported;Bilateral upper extremity supported Static Sitting - Level of Assistance: 7: Independent Dynamic Sitting Balance Dynamic Sitting - Balance Support: During functional activity;No upper extremity supported;Feet supported Dynamic Sitting - Level of Assistance: 5: Stand by assistance Static Standing Balance Static Standing - Balance Support: Right upper extremity supported Static Standing - Level of  Assistance: 4: Min assist;Other (comment) (CGA) Dynamic Standing Balance Dynamic Standing - Balance Support: During functional activity Dynamic Standing - Level of Assistance: 3: Mod assist Extremity Assessment  RLE Assessment RLE Assessment: Within Functional Limits LLE Assessment LLE Assessment: Exceptions to [facility] Strength Comments: Hip 2-/5, knee ext 3-/5, knee flex 2-/5, ankle DF/PF 0/5  Skilled Intervention:  Pt greeted seated in w/c, handoff of care from NT. Pt agreeable to therapy without reports of pain. Donned socks and tennis shoes with L AFO with totalA for time. Wheeled pt with totalA for time to main rehab gym. Completed squat<>pivot transfer with minA towards mat table from w/c - requires assist for w/c setup. Sensation and muscle strength assessment as noted above. Completed 5xSTS (requires use of RUE to assist in pushing self up) 1) Trial 1: 53 seconds 2) Trial 2: 37 seconds 3) Trial 3: 38 seconds AVG = 42.6 seconds. Scores >13.5 seconds indicates increased falls risk  Squat<>pivot transfer with minA back to her w/c from mat table and wheeled to day room rehab gym. Instructed on TUG where she performed x2 trials with minA and hemi-walker: 1) Trial 1: 39mn 2 seconds 2) Trial 2: 259m 55 seconds *Requires CGA for straight path ambulation and minA for turns with increased difficulty performing safety approaches to sitting surfaces   Pt wheeled back to her room and remained seated in w/c at end of session, safety belt alarm on, all needs in reach.   Erika [name] PT 07/31/2021, 12:33 PM

## 2021-07-31 NOTE — Progress Notes (Signed)
PROGRESS NOTE   Subjective/Complaints: Patient's chart reviewed- No issues reported overnight Vitals signs stable, but SBP is below goal of 140-180- will discontinue Cozaar 12.'5mg'$ . Patient feels dizzy when BP too low.   ROS: Patient denies fever, rash, sore throat, blurred vision, nausea, vomiting, diarrhea, cough, shortness of breath or chest pain, joint or back pain .    Objective:   No results found. Recent Labs    07/31/21 0536  WBC 8.9  HGB 12.7  HCT 37.4  PLT 325     Recent Labs    07/31/21 0536  NA 138  K 3.8  CL 99  CO2 31  GLUCOSE 103*  BUN 15  CREATININE 1.01*  CALCIUM 10.1      Intake/Output Summary (Last 24 hours) at 07/31/2021 1122 Last data filed at 07/31/2021 0846 Gross per 24 hour  Intake 820 ml  Output --  Net 820 ml         Physical Exam: Vital Signs Blood pressure 135/82, pulse 70, temperature 98.2 F (36.8 C), temperature source Oral, resp. rate 18, height '5\' 6"'$  (1.676 m), weight 83.7 kg, SpO2 92 %. Gen: no distress, normal appearing HEENT: oral mucosa pink and moist, NCAT Cardio: Reg rate Chest: normal effort, normal rate of breathing Abd: soft, non-distended Ext: no edema  Psych: pleasant and cooperative  Neurological: LUE MAS of 1+ to 2 in L shoulder/L elbow; 1+ in L hand/wrist.---stable   Neurological:    Alert and oriented x 3. Normal insight and awareness. Intact Memory. Normal language and speech.      Comments: Left facial weakness with mild dysarthria. Able to answer orientation questions and follow commands without difficulty.  0/5 LUE and trace-1/5 prox to 0/5 distally LLE 4/5 on Right side    Assessment/Plan: 1. Functional deficits which require 3+ hours per day of interdisciplinary therapy in a comprehensive inpatient rehab setting. Physiatrist is providing close team supervision and 24 hour management of active medical problems listed below. Physiatrist and  rehab team continue to assess barriers to discharge/monitor patient progress toward functional and medical goals  Care Tool:  Bathing    Body parts bathed by patient: Abdomen, Chest, Left arm, Right arm, Front perineal area, Right upper leg, Left upper leg, Right lower leg, Left lower leg, Face   Body parts bathed by helper: Buttocks     Bathing assist Assist Level: Minimal Assistance - Patient > 75%     Upper Body Dressing/Undressing Upper body dressing   What is the patient wearing?: Pull over shirt    Upper body assist Assist Level: Minimal Assistance - Patient > 75%    Lower Body Dressing/Undressing Lower body dressing      What is the patient wearing?: Pants     Lower body assist Assist for lower body dressing: Minimal Assistance - Patient > 75%     Toileting Toileting    Toileting assist Assist for toileting: Minimal Assistance - Patient > 75%     Transfers Chair/bed transfer  Transfers assist     Chair/bed transfer assist level: Moderate Assistance - Patient 50 - 74%     Locomotion Ambulation   Ambulation assist  Assist level: Minimal Assistance - Patient > 75% Assistive device: Walker-hemi Max distance: 25   Walk 10 feet activity   Assist     Assist level: Minimal Assistance - Patient > 75% Assistive device: Walker-hemi   Walk 50 feet activity   Assist Walk 50 feet with 2 turns activity did not occur: Safety/medical concerns  Assist level: Dependent - Patient 0% Assistive device: Lite Gait    Walk 150 feet activity   Assist Walk 150 feet activity did not occur: Safety/medical concerns         Walk 10 feet on uneven surface  activity   Assist Walk 10 feet on uneven surfaces activity did not occur: Safety/medical concerns         Wheelchair     Assist Will patient use wheelchair at discharge?: Yes Type of Wheelchair: Manual    Wheelchair assist level: Supervision/Verbal cueing Max wheelchair distance:  100    Wheelchair 50 feet with 2 turns activity    Assist        Assist Level: Supervision/Verbal cueing   Wheelchair 150 feet activity     Assist      Assist Level: Supervision/Verbal cueing   Blood pressure 135/82, pulse 70, temperature 98.2 F (36.8 C), temperature source Oral, resp. rate 18, height '5\' 6"'$  (1.676 m), weight 83.7 kg, SpO2 92 %.    Medical Problem List and Plan: 1.  Right pontine stroke             -patient may shower             -ELOS: 8/9 extended due to upgrade of goals to supervision so elderly caregiver can assist  pt forgot about new date , she then recalled the change   -Continue CIR therapies including PT, OT, and SLP - Estim patch to finger and wrist extensors  -grounds pass prn  2.  Impaired mobility: Continue Lovenox             -antiplatelet therapy: Continue ASA/Plavix 3. Cluster headaches and migraines: continue abortive oxygen PRN. Decrease oxycodone to BID prn for HA. Discussed outpatient Botox- patient would like to try. D/c periactin and phenergan as patient not using 4. Mood: Team to provide ego support. LCSW to follow for evaluation and support.             -antipsychotic agents: Lamictal daily.  -neuropsych input appreciated!  5. Neuropsych: This patient is capable of making decisions on her own behalf. 6. Skin/Wound Care: Routine pressure relief measures. 7. Fluids/Electrolytes/Nutrition: Monitor I/O. Lytes reviewed and stable 8. HTN: SBP goal 140-180 range to avoid hypoperfusion of brain. Per husband "acts drunk when BP is low" Continue Imdur, aldactone, Demadex Below goal: discontinue Coreg and Cozaar Vitals:   07/30/21 1945 07/31/21 0422  BP: (!) 149/71 135/82  Pulse: 70 70  Resp: 18 18  Temp: 98.4 F (36.9 C) 98.2 F (36.8 C)  SpO2: 95% 92%   9. T2DM: Hgb A1C- 10.7. BS run 200's at home. Dietary education/reinforcement.             Continue decreased Lantus to 22U BID CBG (last 3)  Recent Labs    07/30/21 1627  07/30/21 2134 07/31/21 0630  GLUCAP 229* 144* 98   10. CAD s/p CABG: On ASA, coreg, Cozaar, Demadex, ASA, 11. PAD w/claudication: Has been smoking 1 PPD --quit at admission. Continue Nicotine patch. --On lyrica BID to manage symptoms.   12. Aspiration PNA: CT reviewed, appreciate medicine consult,  Resolved off  abx may d/c Midline 13. Chronic cluster headaches, currently well controlled: Managed with thorazine as well as oxycodone and high flow oxygen prn. Decreased oxycodone to TID PRN and edited order to indicate it should be given after therapy as per husband's request- makes her too sedated to tolerate therapy. 7/16- added Periactin prn for associated Sx's-con't oxy and O2.    8/1- stopped since wasn't using.  14. H/o depression/anxiety: Continue Lamictal, Prozac with Klonopin prn. 15. Chronic insomnia: Managed with home dose trazodone. 16. Dyslipidemia: Trig- 636, direct LDL-99.6, HDL 29, Chol 202. On Zetia--to start Vespc 17. Vitamin B 12 deficiency: B12- 169 (was WNL a year ago) 18. NASH/Fatty liver: LFTs reviewed and normalized 19. Dense left sided hemiplegia: [place] regarding NMES- started trials Friday. They have TENS unit at home.  20. Hypokalemia: supplement 31mq 7/7 and repeat BMP reviewed, K+ improved to 3.8. Resolved. D/c daily supplement.  21. Leukocytosis: resolved 22. Vitamin D deficiency: continue ergocalciferol 50,000U weekly for 7 weeks.  23.  Hypoxia ,resolved  24. AKI: Cr increased 8/8- decrease cozaar 25. Poor perfusion of LLE  7/17- per IM, LLE is colder than RLE_ by a lot- and no DP pulse palpable- Vascular said con't current regimen and monitor closely for low perfusion injury. 7/18 leg feels warmer (by comparative accounts). Normal appearing   -recent ABI 0.46 Consider nitroglycerin patch to improve blood flow, but may lower BP and worsen headaches.  Can consider outpatient Qutenza patch for her pain from claudication 26. Left post-stroke shoulder  pain: d/c lidocaine patch and ordered voltaren gel. Apply kinesiology tape. Continue to use brace as needed.  266 S/p fall- lac to left knee, Head CT negative- reviewed with patient and husband. Please use Stedy for all transfers.  28. Hypokalemia: continue daily 224m supplement 29. Spasticity: patient developing spasticity of LUE in spite of beginning flaccidity- don't think needs Baclofen as of yet - suggest ROM 2-3x/day esp of L shoulder and elbow- she is very diligent about this and spasticity is improving 30. Disposition: Lives with husband and daughter (autistic but high functioning)- husband very involved and has already coordinated with his boss to take time off to care for her. HFU scheduled. Daughter doing very well with family training. Will provide mother-in-law with list of all medications that she is on and their indications.     LOS: 33 days A FACE TO FAHobbsaulkar 07/31/2021, 11:22 AM

## 2021-07-31 NOTE — Progress Notes (Signed)
Occupational Therapy Discharge Summary  Patient Details  Name: Erika [name] MRN: [id number] Date of Birth: March 25, 1971  Today's Date: 07/31/2021 OT Individual Time: 6144-3154 OT Individual Time Calculation (min): 45 min    Patient has met 4 of 12 long term goals due to improved activity tolerance, improved balance, postural control, ability to compensate for deficits, improved attention, and improved awareness.  Patient to discharge at [facility] Assist level.  Patient's care partner is independent to provide the necessary physical and cognitive assistance at discharge.    Reasons goals not met: Self feeding and grooming goal was placed at independent and pt requires setup due to persistent LUE flaccidity. Pt requires min assist for LB dressing primarily due to requiring assist with left AFO donning/doffing and goal was set at [facility].  Functional transfer goals were set at [facility] and pts performance does fluctuate from CGA to min assist depending on pt's fatigue level and external distractions due to impaired selective attention.    Recommendation:  Patient will benefit from ongoing skilled OT services in outpatient setting to continue to advance functional skills in the area of BADL.  Equipment: Drop arm bedside commode, w/c, LUE arm trough  Reasons for discharge: treatment goals met and discharge from hospital  Patient/family agrees with progress made and goals achieved: Yes  Skilled Intervention:  Pt sitting up in w/c, c/o 7/10 pain right side of head.  Nursing notified and pain medication requested per pt.  Agreeable to OT session. Educated pt on comepnsatory strategies donning/doffing socks and shoes with use of reacher and figure 4 positioning with increased time required and min assist for AFO donning.  Pt also attempting to doff AFO with leg strap still secured needing multimodal cues.  Husband arrived and reported feeling nervous about car transfers stating he has done them but would  like more pracitce. Pt transported to ortho gym and completed squat pivot x 2 trials w/c<>car simulator with assist provided primarily from husband and needing +2 for CGA posteriorly as well as husband needing intermittent Vcs for safe body mechanics and weight shifting techniques.  Pt returned to room and call bell in reach, seat alarm on at end of session.   OT Discharge Precautions/Restrictions  Precautions Precautions: Fall Precaution Comments: L hemiplegia Restrictions Weight Bearing Restrictions: No Pain Pain Assessment Pain Scale: Faces Pain Type: Chronic pain Pain Location: Head Pain Descriptors / Indicators: Headache Pain Onset: On-going Pain Intervention(s): Relaxation;Ambulation/increased activity;Repositioned;Emotional support ADL ADL Eating: Set up Grooming: Setup Upper Body Bathing: Contact guard Where Assessed-Upper Body Bathing: Shower Lower Body Bathing: Contact guard Where Assessed-Lower Body Bathing: Shower Upper Body Dressing: Setup, Minimal cueing Lower Body Dressing: Moderate assistance Toileting: Moderate assistance Where Assessed-Toileting: Bedside Commode Toilet Transfer: Minimal assistance Toilet Transfer Method: Squat pivot Toilet Transfer Equipment: Drop arm bedside commode Social research officer, government: Minimal assistance Social research officer, government Method: Stand pivot, Ambulating (ambulated to shower, sq pivot out to [employer]) [employer]: Radio broadcast assistant, Grab bars Vision Baseline Vision/History: No visual deficits Wears Glasses: Reading only Patient Visual Report: No change from baseline Vision Assessment?: Yes;No apparent visual deficits Eye Alignment: Within Functional Limits Ocular Range of Motion: Within Functional Limits Visual Fields: No apparent deficits Perception  Perception: Within Functional Limits Praxis Praxis: Intact Cognition Overall Cognitive Status: Within Functional Limits for tasks assessed Arousal/Alertness:  Awake/alert Orientation Level: Oriented X4 Attention: Sustained;Focused Focused Attention: Appears intact Sustained Attention: Appears intact Awareness: Appears intact Problem Solving: Impaired Safety/Judgment: Impaired Sensation Sensation Light Touch: Appears Intact Hot/Cold:  Appears Intact Proprioception: Impaired by gross assessment Additional Comments: Intact sensation on LUE and LLE. Intact proprioception with LUE. Impaired proprioception on LLE. Coordination Gross Motor Movements are Fluid and Coordinated: No Fine Motor Movements are Fluid and Coordinated: No Coordination and Movement Description: L hemi (LUE>LLE) Heel Shin Test: UTA 2/2 LLE weakness Motor  Motor Motor: Hemiplegia Motor - Discharge Observations: LUE > LLE Mobility  Bed Mobility Bed Mobility: Supine to Sit;Sit to Supine Supine to Sit: Moderate Assistance - Patient 50-74% Sit to Supine: Moderate Assistance - Patient 50-74% Transfers Sit to Stand: Contact Guard/Touching assist Stand to Sit: Contact Guard/Touching assist  Trunk/Postural Assessment  Cervical Assessment Cervical Assessment: Within Functional Limits Thoracic Assessment Thoracic Assessment: Exceptions to [facility] (rounded shoulders) Lumbar Assessment Lumbar Assessment: Exceptions to [facility] (posterior pelvic tilt) Postural Control Postural Control: Within Functional Limits Righting Reactions: Delayed  Balance Balance Balance Assessed: Yes Standardized Balance Assessment Standardized Balance Assessment: Timed Up and Go Test Timed Up and Go Test TUG: Normal TUG Normal TUG (seconds): 180 Static Sitting Balance Static Sitting - Balance Support: Feet supported;Bilateral upper extremity supported Static Sitting - Level of Assistance: 7: Independent Dynamic Sitting Balance Dynamic Sitting - Balance Support: During functional activity;No upper extremity supported;Feet supported Dynamic Sitting - Level of Assistance: 5: Stand by assistance Static  Standing Balance Static Standing - Balance Support: Right upper extremity supported Static Standing - Level of Assistance: 4: Min assist;Other (comment) (cga) Dynamic Standing Balance Dynamic Standing - Balance Support: During functional activity Dynamic Standing - Level of Assistance: 3: Mod assist Extremity/Trunk Assessment RUE Assessment RUE Assessment: Within Functional Limits LUE Assessment LUE Assessment: Exceptions to [facility] Passive Range of Motion (PROM) Comments: WFL Active Range of Motion (AROM) Comments: none General Strength Comments: 2-/5 scapular elevation and retraction; 1/5 shoulder abduction; 0/5 distally all other musculature LUE LUE Body System: Neuro Brunstrum level for arm: Stage I Presynergy Brunstrum level for hand: Stage I Flaccidity   Andrianna Manalang L Brinna Divelbiss 07/31/2021, 8:49 AM

## 2021-08-01 ENCOUNTER — Other Ambulatory Visit (HOSPITAL_COMMUNITY): Payer: Self-pay | Admitting: *Deleted

## 2021-08-01 DIAGNOSIS — F4310 Post-traumatic stress disorder, unspecified: Secondary | ICD-10-CM

## 2021-08-01 LAB — GLUCOSE, CAPILLARY: Glucose-Capillary: 73 mg/dL (ref 70–99)

## 2021-08-01 MED ORDER — POLYETHYLENE GLYCOL 3350 17 G PO PACK: 17.0000 g | PACK | Freq: Two times a day (BID) | ORAL | 0 refills | Status: DC

## 2021-08-01 MED ORDER — TRAZODONE HCL 50 MG PO TABS: 50.0000 mg | ORAL_TABLET | Freq: Every day | ORAL | 0 refills | Status: DC

## 2021-08-01 MED ORDER — ROSUVASTATIN CALCIUM 20 MG PO TABS: 20.0000 mg | ORAL_TABLET | Freq: Every day | ORAL | 0 refills | Status: DC

## 2021-08-01 MED ORDER — ACETAMINOPHEN 325 MG PO TABS: 325.0000 mg | ORAL_TABLET | ORAL | Status: DC | PRN

## 2021-08-01 MED ORDER — SPIRONOLACTONE 25 MG PO TABS: 25.0000 mg | ORAL_TABLET | Freq: Every day | ORAL | 0 refills | Status: DC

## 2021-08-01 MED ORDER — GUAIFENESIN ER 600 MG PO TB12: 600.0000 mg | ORAL_TABLET | Freq: Two times a day (BID) | ORAL | 0 refills | Status: DC

## 2021-08-01 MED ORDER — VITAMIN D (ERGOCALCIFEROL) 1.25 MG (50000 UNIT) PO CAPS: 50000.0000 [IU] | ORAL_CAPSULE | ORAL | 0 refills | Status: DC

## 2021-08-01 MED ORDER — CYANOCOBALAMIN 1000 MCG/ML IJ SOLN: 1000.0000 ug | INTRAMUSCULAR | 0 refills | Status: DC

## 2021-08-01 MED ORDER — ISOSORBIDE MONONITRATE ER 60 MG PO TB24: 60.0000 mg | ORAL_TABLET | Freq: Two times a day (BID) | ORAL | 0 refills | Status: DC

## 2021-08-01 MED ORDER — NICOTINE 21 MG/24HR TD PT24: 21.0000 mg | MEDICATED_PATCH | Freq: Every day | TRANSDERMAL | 0 refills | Status: DC

## 2021-08-01 MED ORDER — SENNOSIDES-DOCUSATE SODIUM 8.6-50 MG PO TABS: 2.0000 | ORAL_TABLET | Freq: Two times a day (BID) | ORAL | 0 refills | Status: DC

## 2021-08-01 MED ORDER — PANTOPRAZOLE SODIUM 40 MG PO TBEC: 40.0000 mg | DELAYED_RELEASE_TABLET | Freq: Every day | ORAL | 0 refills | Status: DC

## 2021-08-01 MED ORDER — CHLORPROMAZINE HCL 100 MG PO TABS: ORAL_TABLET | ORAL | 0 refills | Status: DC

## 2021-08-01 MED ORDER — BASAGLAR KWIKPEN 100 UNIT/ML ~~LOC~~ SOPN: 27.0000 [IU] | PEN_INJECTOR | Freq: Two times a day (BID) | SUBCUTANEOUS | 3 refills | Status: DC

## 2021-08-01 MED ORDER — TORSEMIDE 10 MG PO TABS: 10.0000 mg | ORAL_TABLET | Freq: Every day | ORAL | 0 refills | Status: DC

## 2021-08-01 MED ORDER — POTASSIUM CHLORIDE 20 MEQ PO PACK: 20.0000 meq | PACK | Freq: Every day | ORAL | 0 refills | Status: DC

## 2021-08-01 MED ORDER — THIAMINE HCL 100 MG PO TABS: 100.0000 mg | ORAL_TABLET | Freq: Every day | ORAL | 0 refills | Status: DC

## 2021-08-01 NOTE — Discharge Summary (Signed)
Physician Discharge Summary  Patient ID: Erika [name] MRN: [id number] DOB/AGE: May 14, 1971 50 y.o.  Admit date: 06/28/2021 Discharge date: 08/01/2021  Discharge Diagnoses:  Principal Problem:   Right pontine stroke [facility]) Active Problems:   HTN (hypertension), malignant   MDD (major depressive disorder)   Tobacco abuse   Type 2 diabetes mellitus with other specified complication (HCC)   Neurogenic pain   Chronic post-traumatic stress disorder (PTSD)   Depression with anxiety   GERD (gastroesophageal reflux disease)   Discharged Condition: good  Significant Diagnostic Studies: CT HEAD WO CONTRAST  Result Date: 07/12/2021 CLINICAL DATA:  Head trauma severe headache post fall EXAM: CT HEAD WITHOUT CONTRAST TECHNIQUE: Contiguous axial images were obtained from the base of the skull through the vertex without intravenous contrast. COMPARISON:  CT head 07/03/2021 FINDINGS: Brain: Right parietal and occipital infarction. No evidence of large-territorial acute infarction. No parenchymal hemorrhage. No mass lesion. No extra-axial collection. No mass effect or midline shift. No hydrocephalus. Basilar cisterns are patent. Vascular: No hyperdense vessel. Atherosclerotic calcifications are present within the cavernous internal carotid arteries. Skull: No acute fracture or focal lesion. Sinuses/Orbits: Paranasal sinuses and mastoid air cells are clear. The orbits are unremarkable. Other: None. IMPRESSION: No acute intracranial abnormality. Electronically Signed   By: Iven Finn M.D.   On: 07/12/2021 22:21   DG CHEST PORT 1 VIEW  Result Date: 07/08/2021 CLINICAL DATA:  50 year old female follow-up pneumonia. EXAM: PORTABLE CHEST 1 VIEW COMPARISON:  Portable chest 07/06/2021 and CT 07/03/2021. FINDINGS: Portable AP semi upright view at 0948 hours. Larger lung volumes. Mildly rotated to the right. Mediastinal contours remain normal. Prior CABG. Visualized tracheal air column is within normal  limits. No pneumothorax, pulmonary edema or pleural effusion. Streaky opacity at the right lung base appears regressed since 07/03/2021. No areas of worsening ventilation. Paucity of bowel gas in the upper abdomen. No acute osseous abnormality identified. IMPRESSION: 1. Regressed but not fully resolved streaky opacity at the right lung base since 07/03/2021 compatible with regression of infection and/or atelectasis. 2. No new cardiopulmonary abnormality. Electronically Signed   By: Genevie Ann M.D.   On: 07/08/2021 12:19   DG CHEST PORT 1 VIEW  Result Date: 07/06/2021 CLINICAL DATA:  Shortness of breath. EXAM: PORTABLE CHEST 1 VIEW COMPARISON:  07/03/2021. FINDINGS: Cardiomediastinal silhouette is unchanged with postsurgical changes and median sternotomy. The superior-most two median sternotomy wires are fracture, similar to prior. Similar versus mildly increased opacities at the right lung base. No visible pleural effusions or pneumothorax. No acute osseous abnormality. IMPRESSION: Similar versus mildly increased opacities at the right lung base, which most likely represent aspiration and/or pneumonia given findings on the recent CT chest. Electronically Signed   By: Margaretha Sheffield MD   On: 07/06/2021 12:39   DG Swallowing Func-Speech Pathology  Result Date: 07/05/2021 Formatting of this result is different from the original. Objective Swallowing Evaluation: Type of Study: MBS-Modified Barium Swallow Study  Patient Details Name: Erika [name] MRN: [id number] Date of Birth: 1971-09-12 Today's Date: 07/05/2021 Past Medical History: Past Medical History: Diagnosis Date  Arthralgia of temporomandibular joint   CAD, multiple vessel   a. 06/2016 Cath: ostLM 40%, ostLAD 40%, pLAD 95%, ost-pLCx 60%, pLCx 95%, mLCx 60%, mRCA 95%, D2 50%, LVSF nl;  b. 07/2016 CABG x 4 (LIMA->LAD, VG->Diag, VG->OM, VG->RCA); c. 08/2016 Cath: 3VD w/ 4/4 patent grafts. LAD distal to LIMA has diff dzs->Med rx; d. 08/2020 Cath: 4/4 patent  grafts, native 3VD. EF 55-65%-->Med Rx.  Carotid  arterial disease ([place])   a. 07/2016 s/p R CEA; b. 02/2021 U/S: RICA 50-56%, LICA 9-79%.  Clotting disorder ([place])   Depression   Diastolic dysfunction   a. 06/2016 Echo: EF 50-55%, mild inf wall HK, GR1DD, mild MR, RV sys fxn nl, mildly dilated LA, PASP nl  Fatty liver disease, nonalcoholic 4801  History of blood transfusion   with heart surgery  HLD (hyperlipidemia)   Labile hypertension   a. prior renal ngiogram negative for RAS in 03/2016; b. catecholamines and metanephrines normal, mildly elevated renin with normal aldosterone and normal ratio in 02/2016  Myocardial infarction [facility]) 2017  Obesity   PAD (peripheral artery disease) ([place])   a. 09/2018 s/p L SFA stenting; b. 07/2019 Periph Angio: Patent m/d L SFA stent w/ 100% L SFA distal to stent. L AT 100d, L Peroneal diff dzs-->Med Rx; c. 02/2021 ABIs: stable @ 0.61 on R and 0.46 on L.  PTSD (post-traumatic stress disorder)   Tobacco abuse   Type 2 diabetes mellitus ([place]) 12/2015 Past Surgical History: Past Surgical History: Procedure Laterality Date  ABDOMINAL AORTOGRAM W/LOWER EXTREMITY [name] 10/15/2018  Procedure: ABDOMINAL AORTOGRAM W/LOWER EXTREMITY;  Surgeon: Wellington Hampshire, MD;  Location: [place] [facility];  Service: Cardiovascular;  Laterality: [name];  ABDOMINAL AORTOGRAM W/LOWER EXTREMITY Bilateral 08/19/2019  Procedure: ABDOMINAL AORTOGRAM W/LOWER EXTREMITY;  Surgeon: Wellington Hampshire, MD;  Location: [place] [facility];  Service: Cardiovascular;  Laterality: Bilateral;  CARDIAC CATHETERIZATION [name] 06/29/2016  Procedure: Left Heart Cath and Coronary Angiography;  Surgeon: Minna Merritts, MD;  Location: [place] [facility];  Service: Cardiovascular;  Laterality: [name];  CARDIAC CATHETERIZATION [name] 08/29/2016  Procedure: Left Heart Cath and Cors/Grafts Angiography;  Surgeon: Wellington Hampshire, MD;  Location: [place] [facility];  Service: Cardiovascular;  Laterality: [name];  CESAREAN SECTION    CHOLECYSTECTOMY    CORONARY  ARTERY BYPASS GRAFT [name] 07/06/2016  Procedure: CORONARY ARTERY BYPASS GRAFTING (CABG) x four, using left internal mammary artery and right leg greater saphenous vein harvested endoscopically;  Surgeon: Ivin Poot, MD;  Location: [place];  Service: Open Heart Surgery;  Laterality: [name];  ENDARTERECTOMY Right 07/06/2016  Procedure: ENDARTERECTOMY CAROTID;  Surgeon: Rosetta Posner, MD;  Location: [facility] OR;  Service: Vascular;  Laterality: Right;  ENDARTERECTOMY Right 04/27/2020  Procedure: REDO OF RIGHT ENDARTERECTOMY CAROTID;  Surgeon: Rosetta Posner, MD;  Location: [facility] OR;  Service: Vascular;  Laterality: Right;  LEFT HEART CATH AND CORS/GRAFTS ANGIOGRAPHY [name] 08/24/2020  Procedure: LEFT HEART CATH AND CORS/GRAFTS ANGIOGRAPHY;  Surgeon: Wellington Hampshire, MD;  Location: [place] [facility];  Service: Cardiovascular;  Laterality: [name];  PERIPHERAL VASCULAR CATHETERIZATION [name] 04/18/2016  Procedure: Renal Angiography;  Surgeon: Wellington Hampshire, MD;  Location: [place] [facility];  Service: Cardiovascular;  Laterality: [name];  PERIPHERAL VASCULAR INTERVENTION Left 10/15/2018  Procedure: PERIPHERAL VASCULAR INTERVENTION;  Surgeon: Wellington Hampshire, MD;  Location: [place] [facility];  Service: Cardiovascular;  Laterality: Left;  Left superficial femoral  TEE WITHOUT CARDIOVERSION [name] 07/06/2016  Procedure: TRANSESOPHAGEAL ECHOCARDIOGRAM (TEE);  Surgeon: Ivin Poot, MD;  Location: [place];  Service: Open Heart Surgery;  Laterality: [name];  TONSILLECTOMY   HPI: See H&P  Subjective: pt awake, resting in bed in room. Husband present. Noted much improved Left orofacial weakness at rest. Wearing Dentures now. Assessment / Plan / Recommendation CHL IP CLINICAL IMPRESSIONS 07/05/2021 Clinical Impression Patient demonstrates a moderate oral and mild pharyngeal dysphagia. Patient's oral phase is characterized by decreased bolus  cohesion with intermittent premature spillage with thin liquids. Patient consistently able to trigger her swallow at the  level of the pyriform sinuses and was able to fully protect her airway with both sips via cup and straw. Impaired mastication with use of a munching pattern, piecemeal swallowing and oral stasis was noted with solid textures.  Recommend patient continue current diet of Dys. 2 textures with thin liquids. Patient reports increased comfort with use of cup sips, therefore, recommend no straws at this time as patient can demonstrate use of smaller sips with increased oral control and to maximize patient's comfort. Also recommend continuing strict aspiration precautions of out of bed for meals, limit distractions, check for oral stasis and meds whole in puree. Educated both the patient and her husband regarding results, both verbalize understanding. SLP Visit Diagnosis Dysphagia, oropharyngeal phase (R13.12) Attention and concentration deficit following -- Frontal lobe and executive function deficit following -- Impact on safety and function Mild aspiration risk   CHL IP TREATMENT RECOMMENDATION 07/05/2021 Treatment Recommendations Therapy as outlined in treatment plan below   Prognosis 07/05/2021 Prognosis for Safe Diet Advancement Good Barriers to Reach Goals Cognitive deficits Barriers/Prognosis Comment -- CHL IP DIET RECOMMENDATION 07/05/2021 SLP Diet Recommendations Dysphagia 2 (Fine chop) solids;Thin liquid Liquid Administration via Cup;No straw Medication Administration Whole meds with puree Compensations Minimize environmental distractions;Small sips/bites;Slow rate;Lingual sweep for clearance of pocketing Postural Changes (No Data)   CHL IP OTHER RECOMMENDATIONS 07/05/2021 Recommended Consults -- Oral Care Recommendations Oral care BID Other Recommendations --   CHL IP FOLLOW UP RECOMMENDATIONS 07/05/2021 Follow up Recommendations Inpatient Rehab   CHL IP FREQUENCY AND DURATION 07/05/2021 Speech Therapy Frequency (ACUTE ONLY) min 3x week Treatment Duration 3 weeks      CHL IP ORAL PHASE 07/05/2021 Oral Phase Impaired  Oral - Pudding Teaspoon -- Oral - Pudding Cup -- Oral - Honey Teaspoon -- Oral - Honey Cup -- Oral - Nectar Teaspoon -- Oral - Nectar Cup -- Oral - Nectar Straw -- Oral - Thin Teaspoon -- Oral - Thin Cup Decreased bolus cohesion;Premature spillage;Weak lingual manipulation Oral - Thin Straw Decreased bolus cohesion;Premature spillage Oral - Puree WFL Oral - Mech Soft Impaired mastication;Decreased bolus cohesion;Piecemeal swallowing;Lingual pumping Oral - Regular -- Oral - Multi-Consistency -- Oral - Pill -- Oral Phase - Comment --  CHL IP PHARYNGEAL PHASE 07/05/2021 Pharyngeal Phase Impaired Pharyngeal- Pudding Teaspoon -- Pharyngeal -- Pharyngeal- Pudding Cup -- Pharyngeal -- Pharyngeal- Honey Teaspoon -- Pharyngeal -- Pharyngeal- Honey Cup -- Pharyngeal -- Pharyngeal- Nectar Teaspoon -- Pharyngeal -- Pharyngeal- Nectar Cup -- Pharyngeal -- Pharyngeal- Nectar Straw -- Pharyngeal -- Pharyngeal- Thin Teaspoon -- Pharyngeal -- Pharyngeal- Thin Cup Penetration/Aspiration during swallow;Delayed swallow initiation-pyriform sinuses Pharyngeal Material enters airway, remains ABOVE vocal cords then ejected out Pharyngeal- Thin Straw Delayed swallow initiation-pyriform sinuses Pharyngeal Material does not enter airway Pharyngeal- Puree Delayed swallow initiation-vallecula Pharyngeal Material does not enter airway Pharyngeal- Mechanical Soft Delayed swallow initiation-vallecula Pharyngeal Material does not enter airway Pharyngeal- Regular -- Pharyngeal -- Pharyngeal- Multi-consistency -- Pharyngeal -- Pharyngeal- Pill -- Pharyngeal -- Pharyngeal Comment --  No flowsheet data found. PAYNE, COURTNEY 07/05/2021, 12:31 PM    Weston Anna, MA, CCC-SLP             Labs:  Basic Metabolic Panel: BMP Latest Ref Rng & Units 07/31/2021 07/24/2021 07/17/2021  Glucose 70 - 99 mg/dL 103(H) 68(L) 98  BUN 6 - 20 mg/dL $Remove'15 15 13  'pLGJDqb$ Creatinine 0.44 - 1.00 mg/dL 1.01(H) 0.83 0.87  BUN/Creat Ratio  9 - 23 - - -  Sodium 135 - 145 mmol/L 138 138  137  Potassium 3.5 - 5.1 mmol/L 3.8 3.7 3.7  Chloride 98 - 111 mmol/L 99 98 96(L)  CO2 22 - 32 mmol/L 31 32 32  Calcium 8.9 - 10.3 mg/dL 10.1 10.1 10.2     CBC: CBC Latest Ref Rng & Units 07/31/2021 07/24/2021 07/17/2021  WBC 4.0 - 10.5 K/uL 8.9 8.7 10.8(H)  Hemoglobin 12.0 - 15.0 g/dL 12.7 12.7 12.9  Hematocrit 36.0 - 46.0 % 37.4 37.8 40.6  Platelets 150 - 400 K/uL 325 272 281     CBG: Recent Labs  Lab 07/31/21 0630 07/31/21 1207 07/31/21 1637 07/31/21 2059 08/01/21 0554  GLUCAP 98 133* 95 183* 73    Brief HPI:   Erika [name] is a 50 y.o. RH-female with history of CAD, T2DM, fatty liver, chronic pain, PTSD, poorly controlled HTN;  who was admitted to [facility] on 06/24/2021 with reports of left-sided weakness with falls progressing to left facial weakness and dysarthria.  MRI brain done revealing acute right pontine perforator infarct and area of encephalomalacia with gliosis in right parietal and occipital lobes.  Aspirin was added to home dose Plavix with recommendation of lifelong use due to severity of intracranial atherosclerosis.  CT chest was negative for PE but showed patchy densities in RUL and therefore was started on IV Unasyn due to leukocytosis due to leucocytosis and concerns of aspiration pneumonia.    Dr. Rayann Heman was consulted due to elevated troponin and felt that this was likely due to demand ischemia.  She did have worsening of neurological symptoms with dense left hemiplegia and MRI brain was negative for progression or new changes.  She was noted to be hypotensive the day before and worsening of symptoms was felt to be due to hypotension.  Neurology recommended systolic blood pressure of 1 40-1 80 range as well as work-up for reversible causes of delirium.  She was started on vitamin B12 as well as thiamine empirically.  Mentation was improved however she continued to be limited by dense left hemiplegia with dysarthria affecting ADLs and mobility.  CIR was recommended  due to functional decline.     Hospital Course: Erika [name] was admitted to rehab 06/28/2021 for inpatient therapies to consist of PT, ST and OT at least three hours five days a week. Past admission physiatrist, therapy team and rehab RN have worked together to provide customized collaborative inpatient rehab.Follow-up CBC showed leukocytosis was resolving on Unasyn.  He was making progress however on 07/09 she developed hypoxia with desaturation to 75% requiring nonrebreather mask.  Unasyn was changed to Vanco and cefepime due to concerns of aspiration pneumonia.  Husband also expressed concern concerns about her confusion and CT head done which was negative for acute changes.  She was also noted to be hypotensive with question of hypoperfusion of brain therefore Coreg was further decreased to allow for adequate perfusion.  Due to continued issues with hypoxia, Dr. Lorin Mercy Sheliah Plane hospitalist was consulted for input and recommended making patient n.p.o. and changing antibiotics to Unasyn.    Swallow function was repeated and patient did not show any overt signs of aspiration but had prolonged mastication with MBS showing moderate oral and mild pharyngeal dysphagia therefore diet was downgraded to D2, thin liquids without strawas and strict aspiration  precautions.  Hypoxia resolved and she was educated on strict pulmonary hygiene. She was also treated with gentle IV fluids for hydration, nebulizers  prnand  follow-up chest x-ray showed improvement.  Leukocytosis has resolved antibiotics were discontinued on 07/18.  Her mentation and energy levels have slowly improved and diet was advanced to regular textures by discharge.  Her blood pressures were monitored on TID basis and blood pressure is stable on current regimen.  Her diabetes has been monitored with ac/hs CBG checks and SSI was use prn for tighter BS control.  Lantus has been adjusted during her stay to help with blood sugar control.  She has been  educated on smoking cessation and is to continue nicotine patch at discharge.  Vitamin B12 deficiency noted and she was started on monthly B12 injections as well as oral supplementation.  Serial check of electrolytes showed hypokalemia which has resolved with supplementation. Dr. Rodenbough/neuropsychologist has followed up with patient for evaluation of mood and has worked with patient on coping strategies to help with adjustment issues.  She has had minimal improvement in upper extremity strength and is showing some recovery of left lower extremity with improvement in mobility.  Team has also provided ego support during her stay and husband/family have also been present to provide support.  She has been steady progress during her stay and is currently requires supervision to min assist.  vel to min assist she will continue to receive follow-up outpatient PT, OT and speech therapy at [facility] outpatient rehab after discharge.   Rehab course: During patient's stay in rehab weekly team conferences were held to monitor patient's progress, set goals and discuss barriers to discharge. At admission, patient required max assist with ADL tasks and mod assist with mobility. She exhibited mild cognitive impairments with mild to moderate flaccid dysarthria and mild oropharyngeal dysphagia and SLUMS score 25. She  has had improvement in activity tolerance, balance, postural control as well as ability to compensate for deficits. She has had improvement in functional use LUE  and LLE as well as improvement in awareness  She is able to complete ADL tasks with min assist.she requires mod assist for bed mobility, min to mod assist for transfers and is able to ambulate 35' with min assist and use of hemiwalker.  She is tolerating regular diet and is using swallow strategies at modified independent level.  She has had improvement in ability to complete mildly complex tasks as well as short-term recall with good carryover.  She  requires supervision with multistep commands.  Family education was completed regarding all aspects of safety and care.   Discharge disposition: 01-Home or Self Care  Diet: Carb modified/heart healthy  Special Instructions: Neurology recommends systolic blood pressure goal at 140-180 range to avoid hypoperfusion of brain. 2.  Monitor blood sugars 3-4 times a day and follow-up with PCP for further adjustment in hypoglycemic regimen. 3.  Needs to be upright for meals and follow strict anti-reflux precautions. 4. Use home SSI regimen.   Discharge Instructions     Ambulatory referral to Neurology   Complete by: As directed    An appointment is requested in approximately: 2-3 weeks stroke follow up   Ambulatory referral to Physical Medicine Rehab   Complete by: As directed       Allergies as of 08/01/2021       Reactions   Chantix [varenicline Tartrate] Other (See Comments)   Feels "crazy" and angry        Medication List     STOP taking these medications    amLODipine 10 MG tablet Commonly known as: NORVASC   carvedilol 25 MG tablet Commonly known  as: COREG   cloNIDine 0.1 MG tablet Commonly known as: CATAPRES   dapagliflozin propanediol 10 MG Tabs tablet Commonly known as: Farxiga   insulin aspart 100 UNIT/ML injection Commonly known as: novoLOG   losartan 100 MG tablet Commonly known as: COZAAR   nicotine 14 mg/24hr patch Commonly known as: NICODERM CQ - dosed in mg/24 hours Replaced by: nicotine 21 mg/24hr patch   Repatha SureClick 540 MG/ML Soaj Generic drug: Evolocumab       TAKE these medications    acetaminophen 325 MG tablet Commonly known as: TYLENOL Take 1-2 tablets (325-650 mg total) by mouth every 4 (four) hours as needed for mild pain.   albuterol 108 (90 Base) MCG/ACT inhaler Commonly known as: VENTOLIN HFA Inhale 2 puffs into the lungs every 6 (six) hours as needed for wheezing.   aspirin 81 MG chewable tablet Chew 1 tablet (81 mg  total) by mouth daily.   Basaglar KwikPen 100 UNIT/ML Inject 27 Units into the skin 2 (two) times daily. What changed: how much to take   benztropine 0.5 MG tablet Commonly known as: COGENTIN Take 1 tablet (0.5 mg total) by mouth at bedtime.   clonazePAM 0.5 MG tablet Commonly known as: KlonoPIN Take 1 tablet (0.5 mg total) by mouth 3 (three) times daily as needed for anxiety.   cyanocobalamin 1000 MCG tablet Take 1 tablet (1,000 mcg total) by mouth daily. What changed: Another medication with the same name was added. Make sure you understand how and when to take each.   cyanocobalamin 1000 MCG/ML injection Commonly known as: (VITAMIN B-12) Inject 1 mL (1,000 mcg total) into the skin every 30 (thirty) days. Start taking on: August 29, 2021 What changed: You were already taking a medication with the same name, and this prescription was added. Make sure you understand how and when to take each. Notes to patient: Last dose on 08/07   FLUoxetine HCl 60 MG Tabs Take 60 mg by mouth daily.   guaiFENesin 600 MG 12 hr tablet Commonly known as: MUCINEX Take 1 tablet (600 mg total) by mouth 2 (two) times daily. Notes to patient: Purchase over the counter   Insulin Syringe-Needle U-100 29G X 1/2" 0.3 ML Misc 1 each by Does not apply route 2 (two) times daily.   isosorbide mononitrate 60 MG 24 hr tablet Commonly known as: IMDUR Take 1 tablet (60 mg total) by mouth 2 (two) times daily. What changed:  how much to take how to take this when to take this additional instructions   lamoTRIgine 200 MG tablet Commonly known as: LAMICTAL Take 1 tablet (200 mg total) by mouth at bedtime.   naloxone 4 MG/0.1ML Liqd nasal spray kit Commonly known as: NARCAN Place 1 spray into the nose as needed for up to 365 doses (for opioid-induced respiratory depresssion). In case of emergency (overdose), spray once into each nostril. If no response within 3 minutes, repeat application and call [phone].    nicotine 21 mg/24hr patch Commonly known as: NICODERM CQ - dosed in mg/24 hours Place 1 patch (21 mg total) onto the skin daily. Replaces: nicotine 14 mg/24hr patch   nitroGLYCERIN 0.4 MG SL tablet Commonly known as: NITROSTAT Place 1 tablet (0.4 mg total) under the tongue every 5 (five) minutes as needed for chest pain.   ondansetron 4 MG tablet Commonly known as: ZOFRAN Take 1 tablet (4 mg total) by mouth daily as needed for nausea or vomiting.   oxyCODONE 5 MG immediate release tablet Commonly known  as: Oxy IR/ROXICODONE Take 1 tablet (5 mg total) by mouth 2 (two) times daily as needed for severe pain. Must last 30 days.   pantoprazole 40 MG tablet Commonly known as: PROTONIX Take 1 tablet (40 mg total) by mouth daily at 12 noon.   polyethylene glycol 17 g packet Commonly known as: MIRALAX / GLYCOLAX Take 17 g by mouth 2 (two) times daily.   potassium chloride 20 MEQ packet Commonly known as: KLOR-CON Take 20 mEq by mouth daily.   pregabalin 225 MG capsule Commonly known as: Lyrica Take 1 capsule (225 mg total) by mouth 2 (two) times daily.   rosuvastatin 20 MG tablet Commonly known as: CRESTOR Take 1 tablet (20 mg total) by mouth daily.   senna-docusate 8.6-50 MG tablet Commonly known as: Senokot-S Take 2 tablets by mouth 2 (two) times daily.   spironolactone 25 MG tablet Commonly known as: ALDACTONE Take 1 tablet (25 mg total) by mouth daily.   thiamine 100 MG tablet Take 1 tablet (100 mg total) by mouth daily.   torsemide 10 MG tablet Commonly known as: DEMADEX Take 1 tablet (10 mg total) by mouth daily. Decreased from twice a day. What changed:  medication strength how much to take   traZODone 50 MG tablet Commonly known as: DESYREL Take 1 tablet (50 mg total) by mouth at bedtime.   Trulicity 3.15 VV/6.1YW Sopn Generic drug: Dulaglutide Inject 0.75 mg into the skin once a week. Notes to patient: Can resume today.    Vitamin D (Ergocalciferol)  1.25 MG (50000 UNIT) Caps capsule Commonly known as: DRISDOL Take 1 capsule (50,000 Units total) by mouth every 7 (seven) days. Start taking on: August 04, 2021        Follow-up Information     Lesleigh Noe, MD. Call.   Specialty: Family Medicine Why: for post hospital follow up Contact information: [place] [address] [phone]         Wellington Hampshire, MD .   Specialty: Cardiology Contact information: [place] [address] [phone]         Izora Ribas, MD Follow up.   Specialty: Physical Medicine and Rehabilitation Why: 08/09/21 please arrive at 10:30am. Please call our clinic at [phone] with any questions/concerns you have earlier. Contact information: [address] [place] [address] [phone]                 Signed: Bary Leriche 08/03/2021, 6:54 PM

## 2021-08-01 NOTE — Progress Notes (Signed)
Patient dc'd home, dc instructions provided via pa.  No distress noted.  Patient transported downstairs via wc.

## 2021-08-01 NOTE — Progress Notes (Signed)
PROGRESS NOTE   Subjective/Complaints: Stable for d/c today Will order Depends and continuous glucose monitor for her outpatient.  Discussed elevated creatinine, stopping Losartan  ROS: Patient denies fever, rash, sore throat, blurred vision, nausea, vomiting, diarrhea, cough, shortness of breath or chest pain, joint or back pain. +urinary incontinence   Objective:   No results found. Recent Labs    07/31/21 0536  WBC 8.9  HGB 12.7  HCT 37.4  PLT 325     Recent Labs    07/31/21 0536  NA 138  K 3.8  CL 99  CO2 31  GLUCOSE 103*  BUN 15  CREATININE 1.01*  CALCIUM 10.1      Intake/Output Summary (Last 24 hours) at 08/01/2021 1000 Last data filed at 08/01/2021 0744 Gross per 24 hour  Intake 720 ml  Output --  Net 720 ml         Physical Exam: Vital Signs Blood pressure (!) 144/80, pulse 88, temperature 97.8 F (36.6 C), resp. rate 18, height '5\' 6"'$  (1.676 m), weight 83.7 kg, SpO2 94 %. Gen: no distress, normal appearing HEENT: oral mucosa pink and moist, NCAT Cardio: Reg rate Chest: normal effort, normal rate of breathing Abd: soft, non-distended Ext: no edema  Psych: pleasant and cooperative  Neurological: LUE MAS of 1+ to 2 in L shoulder/L elbow; 1+ in L hand/wrist.---stable   Neurological:    Alert and oriented x 3. Normal insight and awareness. Intact Memory. Normal language and speech.      Comments: Left facial weakness with mild dysarthria. Able to answer orientation questions and follow commands without difficulty.  0/5 LUE and trace-1/5 prox to 0/5 distally LLE 4/5 on Right side    Assessment/Plan: 1. Functional deficits which require 3+ hours per day of interdisciplinary therapy in a comprehensive inpatient rehab setting. Physiatrist is providing close team supervision and 24 hour management of active medical problems listed below. Physiatrist and rehab team continue to assess barriers  to discharge/monitor patient progress toward functional and medical goals  Care Tool:  Bathing    Body parts bathed by patient: Abdomen, Chest, Left arm, Right arm, Front perineal area, Right upper leg, Left upper leg, Right lower leg, Left lower leg, Face   Body parts bathed by helper: Buttocks     Bathing assist Assist Level: Minimal Assistance - Patient > 75%     Upper Body Dressing/Undressing Upper body dressing   What is the patient wearing?: Pull over shirt    Upper body assist Assist Level: Supervision/Verbal cueing    Lower Body Dressing/Undressing Lower body dressing      What is the patient wearing?: Pants, Underwear/pull up     Lower body assist Assist for lower body dressing: Contact Guard/Touching assist     Toileting Toileting    Toileting assist Assist for toileting: Contact Guard/Touching assist     Transfers Chair/bed transfer  Transfers assist     Chair/bed transfer assist level: Moderate Assistance - Patient 50 - 74%     Locomotion Ambulation   Ambulation assist      Assist level: Minimal Assistance - Patient > 75% Assistive device: Walker-hemi Max distance: 35'   Walk 10  feet activity   Assist     Assist level: Minimal Assistance - Patient > 75% Assistive device: Walker-hemi   Walk 50 feet activity   Assist Walk 50 feet with 2 turns activity did not occur: Safety/medical concerns  Assist level: Dependent - Patient 0% Assistive device: Lite Gait    Walk 150 feet activity   Assist Walk 150 feet activity did not occur: Safety/medical concerns         Walk 10 feet on uneven surface  activity   Assist Walk 10 feet on uneven surfaces activity did not occur: Safety/medical concerns         Wheelchair     Assist Will patient use wheelchair at discharge?: Yes Type of Wheelchair: Manual    Wheelchair assist level: Supervision/Verbal cueing Max wheelchair distance: 150'    Wheelchair 50 feet with 2  turns activity    Assist        Assist Level: Supervision/Verbal cueing   Wheelchair 150 feet activity     Assist      Assist Level: Supervision/Verbal cueing   Blood pressure (!) 144/80, pulse 88, temperature 97.8 F (36.6 C), resp. rate 18, height '5\' 6"'$  (1.676 m), weight 83.7 kg, SpO2 94 %.    Medical Problem List and Plan: 1.  Right pontine stroke             -patient may shower             -ELOS: 8/9 extended due to upgrade of goals to supervision so elderly caregiver can assist  pt forgot about new date , she then recalled the change   -Continue CIR therapies including PT, OT, and SLP - Estim patch to finger and wrist extensors  -grounds pass prn  2.  Impaired mobility: Continue Lovenox             -antiplatelet therapy: Continue ASA/Plavix 3. Cluster headaches and migraines: continue abortive oxygen PRN. Decrease oxycodone to BID prn for HA. Discussed outpatient Botox- patient would like to try. D/c periactin and phenergan as patient not using 4. Mood: Team to provide ego support. LCSW to follow for evaluation and support.             -antipsychotic agents: Lamictal daily.  -neuropsych input appreciated!  5. Neuropsych: This patient is capable of making decisions on her own behalf. 6. Skin/Wound Care: Routine pressure relief measures. 7. Fluids/Electrolytes/Nutrition: Monitor I/O. Lytes reviewed and stable 8. HTN: SBP goal 140-180 range to avoid hypoperfusion of brain. Per husband "acts drunk when BP is low" Continue Imdur, aldactone, Demadex Below goal: discontinue Coreg and Cozaar Vitals:   07/31/21 2029 08/01/21 0435  BP: 107/80 (!) 144/80  Pulse: 65 88  Resp: 19 18  Temp: 97.9 F (36.6 C) 97.8 F (36.6 C)  SpO2: 98% 94%   9. T2DM: Hgb A1C- 10.7. BS run 200's at home. Dietary education/reinforcement.             Continue decreased Lantus to 22U BID CBG (last 3)  Recent Labs    07/31/21 1637 07/31/21 2059 08/01/21 0554  GLUCAP 95 183* 73   10.  CAD s/p CABG: On ASA, coreg, Cozaar, Demadex, ASA, 11. PAD w/claudication: Has been smoking 1 PPD --quit at admission. Continue Nicotine patch. --On lyrica BID to manage symptoms.   12. Aspiration PNA: CT reviewed, appreciate medicine consult,  Resolved off abx may d/c Midline 13. Chronic cluster headaches, currently well controlled: Managed with thorazine as well as oxycodone and high  flow oxygen prn. Decreased oxycodone to TID PRN and edited order to indicate it should be given after therapy as per husband's request- makes her too sedated to tolerate therapy. 7/16- added Periactin prn for associated Sx's-con't oxy and O2.    8/1- stopped since wasn't using.  14. H/o depression/anxiety: Continue Lamictal, Prozac with Klonopin prn. 15. Chronic insomnia: Managed with home dose trazodone. 16. Dyslipidemia: Trig- 636, direct LDL-99.6, HDL 29, Chol 202. On Zetia--to start Vespc 17. Vitamin B 12 deficiency: B12- 169 (was WNL a year ago) 18. NASH/Fatty liver: LFTs reviewed and normalized 19. Dense left sided hemiplegia: [place] regarding NMES- started trials Friday. They have TENS unit at home.  20. Hypokalemia: supplement 89mq 7/7 and repeat BMP reviewed, K+ improved to 3.8. Resolved. D/c daily supplement.  21. Leukocytosis: resolved 22. Vitamin D deficiency: continue ergocalciferol 50,000U weekly for 7 weeks.  23.  Hypoxia ,resolved  24. AKI: Cr increased 8/8- decrease cozaar 25. Poor perfusion of LLE  7/17- per IM, LLE is colder than RLE_ by a lot- and no DP pulse palpable- Vascular said con't current regimen and monitor closely for low perfusion injury. 7/18 leg feels warmer (by comparative accounts). Normal appearing   -recent ABI 0.46 Consider nitroglycerin patch to improve blood flow, but may lower BP and worsen headaches.  Can consider outpatient Qutenza patch for her pain from claudication 26. Left post-stroke shoulder pain: d/c lidocaine patch and ordered voltaren gel. Apply  kinesiology tape. Continue to use brace as needed.  270 S/p fall- lac to left knee, Head CT negative- reviewed with patient and husband. Please use Stedy for all transfers.  28. Hypokalemia: continue daily 255m supplement 29. Spasticity: patient developing spasticity of LUE in spite of beginning flaccidity- don't think needs Baclofen as of yet - suggest ROM 2-3x/day esp of L shoulder and elbow- she is very diligent about this and spasticity is improving 30. Urinary incontinence: would benefit from outpatient Depends.  31. Disposition: Lives with husband and daughter (autistic but high functioning)- husband very involved and has already coordinated with his boss to take time off to care for her. HFU scheduled. Daughter doing very well with family training. Will provide mother-in-law with list of all medications that she is on and their indications.      LOS: 34 days A FACE TO FAEwa Beachaulkar 08/01/2021, 10:00 AM

## 2021-08-02 ENCOUNTER — Other Ambulatory Visit: Payer: Self-pay | Admitting: *Deleted

## 2021-08-02 MED ORDER — CLOPIDOGREL BISULFATE 75 MG PO TABS: 75.0000 mg | ORAL_TABLET | Freq: Every day | ORAL | 0 refills | Status: DC

## 2021-08-03 ENCOUNTER — Other Ambulatory Visit: Payer: Self-pay

## 2021-08-03 ENCOUNTER — Other Ambulatory Visit (HOSPITAL_COMMUNITY): Payer: Self-pay | Admitting: Family Medicine

## 2021-08-03 ENCOUNTER — Ambulatory Visit: Payer: Managed Care, Other (non HMO)

## 2021-08-03 ENCOUNTER — Telehealth: Payer: Self-pay

## 2021-08-03 ENCOUNTER — Ambulatory Visit: Payer: Managed Care, Other (non HMO) | Attending: Physical Medicine and Rehabilitation | Admitting: Speech Pathology

## 2021-08-03 DIAGNOSIS — I69354 Hemiplegia and hemiparesis following cerebral infarction affecting left non-dominant side: Secondary | ICD-10-CM

## 2021-08-03 DIAGNOSIS — R2681 Unsteadiness on feet: Secondary | ICD-10-CM | POA: Diagnosis present

## 2021-08-03 DIAGNOSIS — M6281 Muscle weakness (generalized): Secondary | ICD-10-CM | POA: Diagnosis present

## 2021-08-03 DIAGNOSIS — R269 Unspecified abnormalities of gait and mobility: Secondary | ICD-10-CM | POA: Insufficient documentation

## 2021-08-03 DIAGNOSIS — R41841 Cognitive communication deficit: Secondary | ICD-10-CM

## 2021-08-03 DIAGNOSIS — F331 Major depressive disorder, recurrent, moderate: Secondary | ICD-10-CM

## 2021-08-03 DIAGNOSIS — R278 Other lack of coordination: Secondary | ICD-10-CM

## 2021-08-03 DIAGNOSIS — I6350 Cerebral infarction due to unspecified occlusion or stenosis of unspecified cerebral artery: Secondary | ICD-10-CM | POA: Insufficient documentation

## 2021-08-03 DIAGNOSIS — R262 Difficulty in walking, not elsewhere classified: Secondary | ICD-10-CM | POA: Diagnosis present

## 2021-08-03 DIAGNOSIS — R471 Dysarthria and anarthria: Secondary | ICD-10-CM | POA: Insufficient documentation

## 2021-08-03 DIAGNOSIS — K219 Gastro-esophageal reflux disease without esophagitis: Secondary | ICD-10-CM

## 2021-08-03 MED ORDER — EZETIMIBE 10 MG PO TABS: 10.0000 mg | ORAL_TABLET | Freq: Every day | ORAL | 0 refills | Status: DC

## 2021-08-03 NOTE — Telephone Encounter (Signed)
Appreciate the information.   Pt also due for hospital/rehab follow-up.   Routing to front office to schedule pt for hospital f/u

## 2021-08-03 NOTE — Therapy (Deleted)
[place] MAIN [facility] SERVICES [place], [place], [address] Phone: [phone]   Fax:  [phone]  Physical Therapy Evaluation  Patient Details  Name: Erika [name] MRN: [id number] Date of Birth: 1971-07-05 No data recorded  Encounter Date: 08/03/2021   PT End of Session - 08/03/21 1431     Visit Number 1    Number of Visits 20   PT/OT/SPEECH   Date for PT Re-Evaluation 10/26/21             Past Medical History:  Diagnosis Date   Arthralgia of temporomandibular joint    CAD, multiple vessel    a. 06/2016 Cath: ostLM 40%, ostLAD 40%, pLAD 95%, ost-pLCx 60%, pLCx 95%, mLCx 60%, mRCA 95%, D2 50%, LVSF nl;  b. 07/2016 CABG x 4 (LIMA->LAD, VG->Diag, VG->OM, VG->RCA); c. 08/2016 Cath: 3VD w/ 4/4 patent grafts. LAD distal to LIMA has diff dzs->Med rx; d. 08/2020 Cath: 4/4 patent grafts, native 3VD. EF 55-65%-->Med Rx.   Carotid arterial disease ([place])    a. 07/2016 s/p R CEA; b. 02/2021 U/S: RICA 123456, LICA 123456.   Clotting disorder ([place])    Depression    Diastolic dysfunction    a. 06/2016 Echo: EF 50-55%, mild inf wall HK, GR1DD, mild MR, RV sys fxn nl, mildly dilated LA, PASP nl   Fatty liver disease, nonalcoholic Q000111Q   History of blood transfusion    with heart surgery   HLD (hyperlipidemia)    Labile hypertension    a. prior renal ngiogram negative for RAS in 03/2016; b. catecholamines and metanephrines normal, mildly elevated renin with normal aldosterone and normal ratio in 02/2016   Myocardial infarction [facility]) 2017   Obesity    PAD (peripheral artery disease) ([place])    a. 09/2018 s/p L SFA stenting; b. 07/2019 Periph Angio: Patent m/d L SFA stent w/ 100% L SFA distal to stent. L AT 100d, L Peroneal diff dzs-->Med Rx; c. 02/2021 ABIs: stable @ 0.61 on R and 0.46 on L.   PTSD (post-traumatic stress disorder)    Tobacco abuse    Type 2 diabetes mellitus ([place]) 12/2015    Past Surgical History:  Procedure Laterality Date    ABDOMINAL AORTOGRAM W/LOWER EXTREMITY [name] 10/15/2018   Procedure: ABDOMINAL AORTOGRAM W/LOWER EXTREMITY;  Surgeon: Wellington Hampshire, MD;  Location: [place] CV LAB;  Service: Cardiovascular;  Laterality: [name];   ABDOMINAL AORTOGRAM W/LOWER EXTREMITY Bilateral 08/19/2019   Procedure: ABDOMINAL AORTOGRAM W/LOWER EXTREMITY;  Surgeon: Wellington Hampshire, MD;  Location: [place] CV LAB;  Service: Cardiovascular;  Laterality: Bilateral;   CARDIAC CATHETERIZATION [name] 06/29/2016   Procedure: Left Heart Cath and Coronary Angiography;  Surgeon: Minna Merritts, MD;  Location: [place] CV LAB;  Service: Cardiovascular;  Laterality: [name];   CARDIAC CATHETERIZATION [name] 08/29/2016   Procedure: Left Heart Cath and Cors/Grafts Angiography;  Surgeon: Wellington Hampshire, MD;  Location: [place] CV LAB;  Service: Cardiovascular;  Laterality: [name];   CESAREAN SECTION     CHOLECYSTECTOMY     CORONARY ARTERY BYPASS GRAFT [name] 07/06/2016   Procedure: CORONARY ARTERY BYPASS GRAFTING (CABG) x four, using left internal mammary artery and right leg greater saphenous vein harvested endoscopically;  Surgeon: Ivin Poot, MD;  Location: [place];  Service: Open Heart Surgery;  Laterality: [name];   ENDARTERECTOMY Right 07/06/2016   Procedure: ENDARTERECTOMY CAROTID;  Surgeon: Rosetta Posner, MD;  Location: [place];  Service: Vascular;  Laterality: Right;  ENDARTERECTOMY Right 04/27/2020   Procedure: REDO OF RIGHT ENDARTERECTOMY CAROTID;  Surgeon: Rosetta Posner, MD;  Location: [place];  Service: Vascular;  Laterality: Right;   LEFT HEART CATH AND CORS/GRAFTS ANGIOGRAPHY [name] 08/24/2020   Procedure: LEFT HEART CATH AND CORS/GRAFTS ANGIOGRAPHY;  Surgeon: Wellington Hampshire, MD;  Location: [place] CV LAB;  Service: Cardiovascular;  Laterality: [name];   PERIPHERAL VASCULAR CATHETERIZATION [name] 04/18/2016   Procedure: Renal Angiography;  Surgeon: Wellington Hampshire, MD;  Location: [place] CV LAB;  Service: Cardiovascular;  Laterality:  [name];   PERIPHERAL VASCULAR INTERVENTION Left 10/15/2018   Procedure: PERIPHERAL VASCULAR INTERVENTION;  Surgeon: Wellington Hampshire, MD;  Location: [place] CV LAB;  Service: Cardiovascular;  Laterality: Left;  Left superficial femoral   TEE WITHOUT CARDIOVERSION [name] 07/06/2016   Procedure: TRANSESOPHAGEAL ECHOCARDIOGRAM (TEE);  Surgeon: Ivin Poot, MD;  Location: [place];  Service: Open Heart Surgery;  Laterality: [name];   TONSILLECTOMY      There were no vitals filed for this visit.    Subjective Assessment - 08/03/21 1409     Subjective Pt reports she she lef tinpatient rehab on 08/01/21. She reports she has been not been able to get in and out of her room very easily and her wheel chair has been scraping door jams. Pt worked on walking in PT in addition to many functional tasks in [place]. Pt reports she has block and PAD in both of her LEs. Pt reports weakness in her L LE was the primary limiting factor. Pt reorts hemiwalker did not work well and she prefers standard walker with her had "strapped" to the chair. Pt caregiver reports she recently bough hemiwalker in order to use at home. Pt reports knee has been bucking and giving out during activities such as transfers and they would line. Pt reports she has good mobility and is able to squat and pivot relatively well. Pt caregiver states he has to gently stretch her out in the arm and the legs to decrease stiffness.    Pertinent History P    Limitations Walking;Standing    How long can you sit comfortably? only until her "butt bone" gets sore    How long can you stand comfortably? Pt cannot stand independently    Currently in Pain? Other (Comment)   occassional cramping that is uncomfortable, no acute pain            FIST test :    Stand pivot transfer: Min A for support, MOD A with STS phase?   STS: Mod Ind w/ use of hemiwalker, able to stand for 50 sec comfortably but her vascular symptoms in her R LE caused some pain and discomfort  and had to sit following 65 sec of standing.                Objective measurements completed on examination: See above findings.               PT Education - 08/03/21 1426     Education Details Pt educated regarding shoe size and optins for AFOs    Person(s) Educated Patient;Spouse    Methods Explanation;Tactile cues    Comprehension Verbalized understanding              PT Short Term Goals - 09/28/19 1020       PT SHORT TERM GOAL #1   Title Patient will be independent with her HEP to decrease hip pain, improve strength, function, and ability to  ambulate longer distances more comfortably.    Time 3    Period Weeks    Status New    Target Date 10/22/19               PT Long Term Goals - 09/28/19 1021       PT LONG TERM GOAL #1   Title Patient will have a decrease in L hip pain to 3/10 or less at worst to promote ability to ambulate longer distances as well as negotiate stairs more comfortably.    Baseline 7/10 L hip pain at most for the past 3 months (09/28/2019)    Time 6    Period Weeks    Status New    Target Date 11/12/19      PT LONG TERM GOAL #2   Title Patient will be able to ambulate for at least 6 minutes for at least 700 ft prior to needing to sit down due to pain as a demonstration of improved ability to ambulate longer distances.    Baseline Pt only able to ambulate 392 ft for 3 minutes prior to sitting down due to L hip pain (09/28/2019)    Time 6    Period Weeks    Status New    Target Date 11/12/19      PT LONG TERM GOAL #3   Title Pt will improve B LE strength by at least 1/2 MMT grade to promote ability to perform functional tasks.    Time 6    Period Weeks    Status New    Target Date 11/12/19                     Patient will benefit from skilled therapeutic intervention in order to improve the following deficits and impairments:     Visit Diagnosis: No diagnosis found.     Problem List Patient  Active Problem List   Diagnosis Date Noted   Chronic post-traumatic stress disorder (PTSD)    Depression with anxiety    Right pontine stroke ([place]) 06/28/2021   Stroke ([place]) 06/24/2021   Chronic diastolic CHF (congestive heart failure) ([place]) 06/24/2021   Fall 06/24/2021   Abnormal LFTs 06/24/2021   Acute respiratory failure with hypoxia ([place]) 06/24/2021   Olecranon bursitis of left elbow 06/12/2021   Chronic hip pain (Bilateral) 06/09/2021   Chronic use of opiate for therapeutic purpose 05/09/2021   Greater trochanteric bursitis of hip (Left) 03/09/2021   Bursitis of hip (Left) Q000111Q   Uncomplicated opioid dependence ([place]) 02/20/2021   Acute conjunctivitis of left eye 01/02/2021   Unintentional weight loss 11/21/2020   Broken teeth (Right) 08/02/2020   History of MI (myocardial infarction) (July 2017) 08/02/2020   Neuropathy 06/09/2020   Dental abscess 06/09/2020   Foot drop (Left) 06/09/2020   Carotid stenosis, asymptomatic, right 04/27/2020   Chronic migraine 11/03/2019   Pyelonephritis due to Escherichia coli 10/26/2019   Thrombocytosis 09/16/2019   Erythrocytosis 09/16/2019   Hypercalcemia 09/06/2019   Leukocytosis 09/06/2019   Polycythemia 09/06/2019   Chronic hip pain (Left) 08/27/2019   Osteoarthritis of hip (Left) 08/27/2019   Gluteal tendonitis of buttock (Left) 08/27/2019   Radial nerve palsy (Right) 07/15/2019   Neuropathy of radial nerve (Right) 06/30/2019   Abnormal bruising 06/25/2019   History of carotid endarterectomy (Right) 03/04/2019   Pain medication agreement signed 03/04/2019   Atypical facial pain (Right) 01/27/2019   Chronic ear pain (Right) 01/27/2019   Chronic jaw pain (Right) 01/27/2019  Geniculate Neuralgia (Right) 01/27/2019   Vitamin D deficiency 01/19/2019   Neurogenic pain 01/19/2019   Chronic anticoagulation (PLAVIX) 01/19/2019   Chronic pain syndrome 01/07/2019   Long term current use of opiate analgesic 01/07/2019   Long term  prescription benzodiazepine use 01/07/2019   Pharmacologic therapy 01/07/2019   Disorder of skeletal system 01/07/2019   Problems influencing health status 01/07/2019   Chronic headaches (1ry area of Pain) (Right) 01/07/2019   Opiate use 09/24/2018   PVD (peripheral vascular disease) ([place]) 06/24/2018   Chronic ankle pain (Bilateral) 12/27/2017   Tendinopathy of gluteus medius (Right) 12/27/2017   Tendinopathy of gluteus medius (Left) 12/27/2017   Bilateral hip pain 12/26/2017   Chronic elbow pain (Left) 10/17/2017   Elevated troponin I level 05/21/2017   Severe sepsis ([place]) 05/19/2017   Major depressive disorder, recurrent episode, moderate ([place]) 11/19/2016   Insomnia 10/30/2016   Constipation 07/25/2016   S/P CABG x 4 07/06/2016   Bradycardia    CAD (coronary artery disease)    Carotid stenosis    CAD in native artery 06/29/2016   Elevated troponin 06/28/2016   Essential hypertension, malignant 06/28/2016   Tobacco abuse 06/28/2016   Essential hypertension    Malignant hypertension    Type 2 diabetes mellitus with other specified complication ([place])    Chest pain with high risk for cardiac etiology 06/27/2016   NSTEMI (non-ST elevated myocardial infarction) ([place]) 06/27/2016   Proteinuria 03/15/2016   Renal artery stenosis ([place]) 03/15/2016   MDD (major depressive disorder) 10/17/2015   Agoraphobia with panic attacks 04/25/2015   HTN (hypertension), malignant 10/20/2013   Cluster headache 03/20/2012   HLD (hyperlipidemia) 07/26/2010   ADJUSTMENT DISORDER WITH MIXED FEATURES 07/26/2010    Particia Lather 08/03/2021, 2:42 PM  [place] MAIN [facility] SERVICES [place], [place], [address] Phone: [phone]   Fax:  [phone]  Name: JENILLE KAHANE MRN: [id number] Date of Birth: January 17, 1971

## 2021-08-03 NOTE — Telephone Encounter (Signed)
Vm from [employer] wanting introduce herself to Dr. Einar Pheasant as pt's Outpatient Case Manager.  She can assist with any education or care coordination needs that may arise for pt.  No need to call back now.  But feel free to call her in the future at [phone].

## 2021-08-03 NOTE — Therapy (Signed)
[place] MAIN [facility] SERVICES [address] [place], [place], [address] Phone: [phone]   Fax:  [phone]  Physical Therapy Evaluation  Patient Details  Name: BRIANNA AUDIBERT MRN: [id number] Date of Birth: 1971-11-28 Referring Provider (PT): Leeroy Cha MD   Encounter Date: 08/03/2021   PT End of Session - 08/03/21 1431     Visit Number 1    Number of Visits 20   PT/OT/SPEECH   Date for PT Re-Evaluation 09/14/21    Authorization Type [employer] max visits 20x per year speech, OT, PT    Authorization Time Period 08/03/21-10/26/21    Progress Note Due on Visit 10    PT Start Time [id number]    PT Stop Time 1503    PT Time Calculation (min) 60 min    Equipment Utilized During Treatment Gait belt;Other (comment)   hemiwalker, ant supportive AFO on the left LE   Activity Tolerance Patient limited by fatigue;Patient limited by pain;Patient tolerated treatment well   chronic PAD pain   Behavior During Therapy [facility] for tasks assessed/performed;Flat affect;Impulsive   most notable during gait training            Past Medical History:  Diagnosis Date   Arthralgia of temporomandibular joint    CAD, multiple vessel    a. 06/2016 Cath: ostLM 40%, ostLAD 40%, pLAD 95%, ost-pLCx 60%, pLCx 95%, mLCx 60%, mRCA 95%, D2 50%, LVSF nl;  b. 07/2016 CABG x 4 (LIMA->LAD, VG->Diag, VG->OM, VG->RCA); c. 08/2016 Cath: 3VD w/ 4/4 patent grafts. LAD distal to LIMA has diff dzs->Med rx; d. 08/2020 Cath: 4/4 patent grafts, native 3VD. EF 55-65%-->Med Rx.   Carotid arterial disease ([place])    a. 07/2016 s/p R CEA; b. 02/2021 U/S: RICA 123456, LICA 123456.   Clotting disorder ([place])    Depression    Diastolic dysfunction    a. 06/2016 Echo: EF 50-55%, mild inf wall HK, GR1DD, mild MR, RV sys fxn nl, mildly dilated LA, PASP nl   Fatty liver disease, nonalcoholic [id number]   History of blood transfusion    with heart surgery   HLD (hyperlipidemia)    Labile hypertension    a.  prior renal ngiogram negative for RAS in 03/2016; b. catecholamines and metanephrines normal, mildly elevated renin with normal aldosterone and normal ratio in 02/2016   Myocardial infarction [facility]) 2017   Obesity    PAD (peripheral artery disease) ([place])    a. 09/2018 s/p L SFA stenting; b. 07/2019 Periph Angio: Patent m/d L SFA stent w/ 100% L SFA distal to stent. L AT 100d, L Peroneal diff dzs-->Med Rx; c. 02/2021 ABIs: stable @ 0.61 on R and 0.46 on L.   PTSD (post-traumatic stress disorder)    Tobacco abuse    Type 2 diabetes mellitus ([place]) 12/2015    Past Surgical History:  Procedure Laterality Date   ABDOMINAL AORTOGRAM W/LOWER EXTREMITY [name] 10/15/2018   Procedure: ABDOMINAL AORTOGRAM W/LOWER EXTREMITY;  Surgeon: Wellington Hampshire, MD;  Location: [place] CV LAB;  Service: Cardiovascular;  Laterality: [name];   ABDOMINAL AORTOGRAM W/LOWER EXTREMITY Bilateral 08/19/2019   Procedure: ABDOMINAL AORTOGRAM W/LOWER EXTREMITY;  Surgeon: Wellington Hampshire, MD;  Location: [place] CV LAB;  Service: Cardiovascular;  Laterality: Bilateral;   CARDIAC CATHETERIZATION [name] 06/29/2016   Procedure: Left Heart Cath and Coronary Angiography;  Surgeon: Minna Merritts, MD;  Location: [place] CV LAB;  Service: Cardiovascular;  Laterality: [name];   CARDIAC CATHETERIZATION [name] 08/29/2016   Procedure:  Left Heart Cath and Cors/Grafts Angiography;  Surgeon: Wellington Hampshire, MD;  Location: [place] CV LAB;  Service: Cardiovascular;  Laterality: [name];   CESAREAN SECTION     CHOLECYSTECTOMY     CORONARY ARTERY BYPASS GRAFT [name] 07/06/2016   Procedure: CORONARY ARTERY BYPASS GRAFTING (CABG) x four, using left internal mammary artery and right leg greater saphenous vein harvested endoscopically;  Surgeon: Ivin Poot, MD;  Location: [place];  Service: Open Heart Surgery;  Laterality: [name];   ENDARTERECTOMY Right 07/06/2016   Procedure: ENDARTERECTOMY CAROTID;  Surgeon: Rosetta Posner, MD;  Location: [facility] OR;  Service:  Vascular;  Laterality: Right;   ENDARTERECTOMY Right 04/27/2020   Procedure: REDO OF RIGHT ENDARTERECTOMY CAROTID;  Surgeon: Rosetta Posner, MD;  Location: [facility] OR;  Service: Vascular;  Laterality: Right;   LEFT HEART CATH AND CORS/GRAFTS ANGIOGRAPHY [name] 08/24/2020   Procedure: LEFT HEART CATH AND CORS/GRAFTS ANGIOGRAPHY;  Surgeon: Wellington Hampshire, MD;  Location: [place] CV LAB;  Service: Cardiovascular;  Laterality: [name];   PERIPHERAL VASCULAR CATHETERIZATION [name] 04/18/2016   Procedure: Renal Angiography;  Surgeon: Wellington Hampshire, MD;  Location: [place] CV LAB;  Service: Cardiovascular;  Laterality: [name];   PERIPHERAL VASCULAR INTERVENTION Left 10/15/2018   Procedure: PERIPHERAL VASCULAR INTERVENTION;  Surgeon: Wellington Hampshire, MD;  Location: [place] CV LAB;  Service: Cardiovascular;  Laterality: Left;  Left superficial femoral   TEE WITHOUT CARDIOVERSION [name] 07/06/2016   Procedure: TRANSESOPHAGEAL ECHOCARDIOGRAM (TEE);  Surgeon: Ivin Poot, MD;  Location: [place];  Service: Open Heart Surgery;  Laterality: [name];   TONSILLECTOMY      There were no vitals filed for this visit.    Subjective Assessment - 08/03/21 1409     Subjective Ellee Schweinsberg is a 53yoF who comes to [facility] 8/[facility] for evaluation and ongoing rehab s/p acute CVA July 2021.    Pertinent History Pt presented to [facility] 06/24/21 c c/c of slurred speech, L facial droop & LUE weakness. MRI revealed acute R pontine perforator infarct with chronic R parietal & occipital infarcts. PMH: HTN, HLD, DM, tobacco abuse, PAD, CAD, CABG, dCHF, peptic foot drop, renal artery stenosis, MI and CAD (s/p of R CEA 2017), PTSD, adjustment disorder, obesity, Left hip OA s/p OPPT 2020. Pt DC 06/28/21 to inpatient rehab in [place], [place] to home 08/01/21. AT CIR pt worked extensively on STS with MinA or Supervision and HW, Stand and squat pivot transfers with MinA, AMB progressed to ~65f max, mostly with HW (some RW use with fixed LUE on  handle), but was mostly limited in further progression of distance 2/2 chronic PAD ischaemic pain or Left knee buckling despite AFO use. CIR recommending update to more supporting AFO at transfer to OPPT. Since home, pt is using WC to navigate the environment, family assists with ADL performance, WC still needs to be modified to fit through BR doorframe. Pt reports not feeling safe with HW at present. Husband has been helping with gnelte stretching in mornings as pt sleeps in a flexion synergy posture on LLE.    Limitations Walking;Standing    How long can you sit comfortably? Generally well tolerated with occasional buttocks soreness    How long can you stand comfortably? At eval: tolerates <2 minutes prior to ischemic leg pain    How long can you walk comfortably? Longest distance at CIR last month ~270f    Patient Stated Goals Return indpendent ambulation    Currently in Pain? Other (  Comment)   occassional cramping that is uncomfortable, no current pain               [facility] PT Assessment - 08/03/21 0001       Assessment   Medical Diagnosis CVA c Left hemiplegia    Referring Provider (PT) Leeroy Cha MD    Onset Date/Surgical Date 06/24/21    Hand Dominance Right    Prior Therapy Acute care, then in-patient rehab      Precautions   Precautions Shoulder;Fall    Type of Shoulder Precautions falccid LUE c early signs of GHJ subluxation    Required Braces or Orthoses Other Brace/Splint   AFO for ankle and knee control   Other Brace/Splint rigid carbon fiber AFO needs currently not sufficiently supportive      Restrictions   Weight Bearing Restrictions No      Balance Screen   Has the patient fallen in the past 6 months Yes    How many times? 2   1x at rehab, 1x at home   Has the patient had a decrease in activity level because of a fear of falling?  --   pt just got home 2 days ago     [place] residence    Living Arrangements  Spouse/significant other;Children   adult aged daughter   Available Help at Discharge Family   MIL   Type of Hope entrance    [place] Bedside commode;Wheelchair - manual;Other (comment)   hemi walker, carbon fiber AFO, gait belt     Prior Function   Level of Independence Needs assistance with ADLs;Needs assistance with homemaking;Needs assistance with transfers;Needs assistance with gait;Independent with household mobility with device      Cognition   Overall Cognitive Status Impaired/Different from baseline   endorses some memory difficulty, defer extensive assessment to OT/SLP     Observation/Other Assessments   Focus on Therapeutic Outcomes (FOTO)  23   predicts 40 by visit 22     Sensation   Light Touch Appears Intact;Not tested   denies any loss of sensation with CVA     Tone   Assessment Location Left Lower Extremity;Left Upper Extremity      ROM / Strength   AROM / PROM / Strength PROM      PROM   PROM Assessment Site Knee;Ankle    Right/Left Knee Left    Left Knee Extension WNL    Left Knee Flexion WNL    Right/Left Ankle Left    Left Ankle Dorsiflexion 15   WNL   Left Ankle Plantar Flexion 50   WNL     Transfers   Transfers Sit to Stand;Stand Pivot Transfers;Squat Pivot Transfers    Sit to Stand 5: Supervision;With armrests;With upper extremity assist;4: Min guard   HW and/or Rt chair arm   Sit to Stand Details --   no cues needed   Five time sit to stand comments  deferred    Stand Pivot Transfers 4: Min assist;With armrests    Stand Pivot Transfer Details (indicate cue type and reason) equally able bilaterally    Squat Pivot Transfers 4: Min assist;With armrests;With upper extremity assistance    Comments 6 total in session      Ambulation/Gait   Ambulation/Gait Yes    Ambulation/Gait Assistance 4: Min guard;4: Min assist    Ambulation Distance (Feet) 20 Feet  28f, then 141f then 1029f Assistive  device Hemi-walker;Other (Comment)   Left Anterior tibial carbon fiber AFO (clinic model)   Gait Pattern Step-to pattern;Left flexed knee in stance;Poor foot clearance - left   inititally with minimal lean of trunk, strictly lateral use of HW with prn weight bearing only   Ambulation Surface Level;Indoor    Gait velocity <0.21m87m    Standardized Balance Assessment   Standardized Balance Assessment --   FIST: Function in sitting test (53/56)     LUE Tone   LUE Tone Flaccid      LLE Tone   LLE Tone Within Functional Limits;Hypotonic             GAIT TRAINING: -15ft65fW, Left AFO, tactile, verbal cues for increased Rt lateral and forward lean on HW and RUE->RLE->LLE sequencing shake-up,  -10ft 44f, Left AFO, tactile, verbal cues for increased Rt lateral and forward lean on HW and RUE->RLE->LLE sequencing shake-up,   *difficulty with sequencing, attempts make confidence and stability of Lt knee decrease. Author able to visualize Left knee buckles and adequate control with AFO.      Objective measurements completed on examination: See above findings.      PT Education - 08/03/21 1426     Education Details Pt educated regarding shoe size and optins for AFOs    Person(s) Educated Patient;Spouse    Methods Explanation;Tactile cues    Comprehension Verbalized understanding              PT Short Term Goals - 08/03/21 1645       PT SHORT TERM GOAL #1   Title Pt to demonstrate improved tolerance to standing >3 minutes without physical assist or LOB. DME ad lib    Baseline <2 minutes at eval    Time 4    Period Weeks    Status New    Target Date 08/31/21      PT SHORT TERM GOAL #2   Title Pt to demonstrate perfromance of 5xSTS c AFO/Hemi walker at supervision level independence without LOB or physical assist.    Baseline Needs minGuard to MinA to perform at eval    Time 4    Period Weeks    Status New    Target Date 08/31/21      PT SHORT TERM GOAL #3   Title  Pt to demonstrate AMB ~15ft a22fpervision level with AFO, HW without LOB.    Baseline At eval limited by [facility] and AFO.    Time 6    Period Weeks    Status New    Target Date 09/14/21      PT SHORT TERM GOAL #4   Title Pt to demonstrate TUG test <120sec c HW and AFO at minguard - MinA level to improve independence with transfers and reduce falls risk at home.    Baseline No tug performed at eval:    Time 7    Period Weeks    Status New    Target Date 09/21/21               PT Long Term Goals - 08/03/21 1647       PT LONG TERM GOAL #1   Title Pt to demonstrate perfromance of 5xSTS c AFO/Hemi walker at supervision level independence without LOB or physical assist in less than 20sec.    Baseline Unable at eval, requires minguard to minA    Time 8  Period Weeks    Status New    Target Date 09/28/21      PT LONG TERM GOAL #2   Title Pt to demonstrate FOTO score improvement >10 points to demonstrate improved perception of ability in ADL    Baseline 23 at eval:    Time 8    Period Weeks    Status New    Target Date 09/28/21      PT LONG TERM GOAL #3   Title Pt able to demonstrate stand pivot transfer with HW and AFO without LOB at supervision level to modI.    Baseline Needs minGuard to MinA for safety    Time 8    Period Weeks    Status New    Target Date 09/28/21      PT LONG TERM GOAL #4   Title Pt to demonstrate TUG test <60sec c HW and AFO at supervision level to improve independence with transfers and reduce falls risk at home.    Baseline No Tug at [facility]    Time 12    Period Weeks    Status New    Target Date 10/26/21                    Plan - 08/03/21 1630     Clinical Impression Statement Pt presenting for OPPT eval 2d s/p DC from CIR to home. Examination demonstrates continues LUE flaccidity, LLE weakness without hypertonicity. Pt has confidence in her abilities in multiple transfers techniques with a variety of DME use, but typically  needs minA to minGuard for safety. Trial of more rigid AFO this date does help abate knee buckling cepisodes in gait. Pt has AMB limitations that are driven mostly but chronic arterial disease and ischaemic pain. Pt has been using HW for gait training at rehab, but technique is not yet optimaized for best economy, gait training commenced this date, noted difficulty with sequencing, falls anxiety, and movement valiability/accuracy. Standardized Balance measure identifies some areas of impairment, but does not appear to offer a desired degree of task specificity- will explore further in future sessions. Pt will benefit from skilled PT evaluation to maximize safety, independence, and tolerance to basic moblity required for ADL performance and accessing the community and to reduce risk of falls in the home.    Personal Factors and Comorbidities Age;Comorbidity 3+;Fitness;Past/Current Experience;Time since onset of injury/illness/exacerbation    Comorbidities DM, HTN, PVD, clotting disorder, CAD    Examination-Activity Limitations Squat;Stairs;Bathing;Bed Mobility;Dressing;Reach Overhead;Hygiene/Grooming;Bend;Carry;Locomotion Level;Transfers;Toileting;Stand    Examination-Participation Restrictions Cleaning;Community Activity;Occupation;Laundry;Driving;Yard Work    [employer] Evolving/Moderate complexity    Clinical Decision Making Moderate    Rehab Potential Good    PT Frequency 2x / week    PT Duration 12 weeks    PT Treatment/Interventions Electrical Stimulation;Gait training;Stair training;Functional mobility training;Therapeutic activities;Therapeutic exercise;Balance training;Neuromuscular re-education;Patient/family education;Manual techniques;Wheelchair mobility training;Energy conservation;Passive range of motion;Orthotic Fit/Training;DME Instruction;ADLs/Self Care Home Management    PT Next Visit Plan work toward supervision level STS and SPT c HW, sustained standing  tolerance.    PT Home Exercise Plan none established    Consulted and Agree with Plan of Care Patient;Family member/caregiver    Family Member Consulted husband             Patient will benefit from skilled therapeutic intervention in order to improve the following deficits and impairments:  Decreased endurance, Decreased mobility, Difficulty walking, Hypomobility, Decreased balance, Abnormal gait, Increased muscle spasms, Cardiopulmonary status limiting activity, Impaired flexibility, Hypermobility, Decreased  strength, Decreased activity tolerance  Visit Diagnosis: Right pontine stroke ([place])  Hemiplegia and hemiparesis following cerebral infarction affecting left non-dominant side (HCC)  Difficulty in walking, not elsewhere classified     Problem List Patient Active Problem List   Diagnosis Date Noted   Chronic post-traumatic stress disorder (PTSD)    Depression with anxiety    Right pontine stroke ([place]) 06/28/2021   Stroke ([place]) 06/24/2021   Chronic diastolic CHF (congestive heart failure) ([place]) 06/24/2021   Fall 06/24/2021   Abnormal LFTs 06/24/2021   Acute respiratory failure with hypoxia ([place]) 06/24/2021   Olecranon bursitis of left elbow 06/12/2021   Chronic hip pain (Bilateral) 06/09/2021   Chronic use of opiate for therapeutic purpose 05/09/2021   Greater trochanteric bursitis of hip (Left) 03/09/2021   Bursitis of hip (Left) [id number]   Uncomplicated opioid dependence ([place]) 02/20/2021   Acute conjunctivitis of left eye 01/02/2021   Unintentional weight loss 11/21/2020   Broken teeth (Right) 08/02/2020   History of MI (myocardial infarction) (July 2017) 08/02/2020   Neuropathy 06/09/2020   Dental abscess 06/09/2020   Foot drop (Left) 06/09/2020   Carotid stenosis, asymptomatic, right 04/27/2020   Chronic migraine 11/03/2019   Pyelonephritis due to Escherichia coli 10/26/2019   Thrombocytosis 09/16/2019   Erythrocytosis 09/16/2019   Hypercalcemia 09/06/2019    Leukocytosis 09/06/2019   Polycythemia 09/06/2019   Chronic hip pain (Left) 08/27/2019   Osteoarthritis of hip (Left) 08/27/2019   Gluteal tendonitis of buttock (Left) 08/27/2019   Radial nerve palsy (Right) 07/15/2019   Neuropathy of radial nerve (Right) 06/30/2019   Abnormal bruising 06/25/2019   History of carotid endarterectomy (Right) 03/04/2019   Pain medication agreement signed 03/04/2019   Atypical facial pain (Right) 01/27/2019   Chronic ear pain (Right) 01/27/2019   Chronic jaw pain (Right) 01/27/2019   Geniculate Neuralgia (Right) 01/27/2019   Vitamin D deficiency 01/19/2019   Neurogenic pain 01/19/2019   Chronic anticoagulation (PLAVIX) 01/19/2019   Chronic pain syndrome 01/07/2019   Long term current use of opiate analgesic 01/07/2019   Long term prescription benzodiazepine use 01/07/2019   Pharmacologic therapy 01/07/2019   Disorder of skeletal system 01/07/2019   Problems influencing health status 01/07/2019   Chronic headaches (1ry area of Pain) (Right) 01/07/2019   Opiate use 09/24/2018   PVD (peripheral vascular disease) ([place]) 06/24/2018   Chronic ankle pain (Bilateral) 12/27/2017   Tendinopathy of gluteus medius (Right) 12/27/2017   Tendinopathy of gluteus medius (Left) 12/27/2017   Bilateral hip pain 12/26/2017   Chronic elbow pain (Left) 10/17/2017   Elevated troponin I level 05/21/2017   Severe sepsis ([place]) 05/19/2017   Major depressive disorder, recurrent episode, moderate (HCC) 11/19/2016   Insomnia 10/30/2016   Constipation 07/25/2016   S/P CABG x 4 07/06/2016   Bradycardia    CAD (coronary artery disease)    Carotid stenosis    CAD in native artery 06/29/2016   Elevated troponin 06/28/2016   Essential hypertension, malignant 06/28/2016   Tobacco abuse 06/28/2016   Essential hypertension    Malignant hypertension    Type 2 diabetes mellitus with other specified complication (HCC)    Chest pain with high risk for cardiac etiology 06/27/2016    NSTEMI (non-ST elevated myocardial infarction) ([place]) 06/27/2016   Proteinuria 03/15/2016   Renal artery stenosis ([place]) 03/15/2016   MDD (major depressive disorder) 10/17/2015   Agoraphobia with panic attacks 04/25/2015   HTN (hypertension), malignant 10/20/2013   Cluster headache 03/20/2012   HLD (hyperlipidemia) 07/26/2010   ADJUSTMENT  DISORDER WITH MIXED FEATURES 07/26/2010   5:15 PM, 08/03/21 Etta Grandchild, PT, DPT Physical Therapist - [facility]  Outpatient Physical Therapy- [place] [phone]     Etta Grandchild 08/03/2021, 5:09 PM  [place] MAIN [facility] SERVICES [address] [place], [place], [address] Phone: [phone]   Fax:  [phone]  Name: GERALENE FROMMER MRN: [id number] Date of Birth: 12/31/1970

## 2021-08-04 NOTE — Therapy (Signed)
[place] MAIN [facility] SERVICES [address] [place], [place], [address] Phone: [phone]   Fax:  [phone]  Speech Language Pathology Evaluation  Patient Details  Name: Erika [name] MRN: [id number] Date of Birth: 12/22/1971 Referring Provider (SLP): Ranell Patrick Clide Deutscher, MD   Encounter Date: 08/03/2021   End of Session - 08/04/21 1201     Visit Number 1    Number of Visits 20    Date for SLP Re-Evaluation 11/02/21    Authorization Type [employer] - 20 visits per calendar year for OT, PT, and ST combined. counts as 1 visit if same day    Authorization - Visit Number 1    Authorization - Number of Visits 20    Progress Note Due on Visit 10    SLP Start Time 1600    SLP Stop Time  1700    SLP Time Calculation (min) 60 min    Activity Tolerance Patient tolerated treatment well             Past Medical History:  Diagnosis Date   Arthralgia of temporomandibular joint    CAD, multiple vessel    a. 06/2016 Cath: ostLM 40%, ostLAD 40%, pLAD 95%, ost-pLCx 60%, pLCx 95%, mLCx 60%, mRCA 95%, D2 50%, LVSF nl;  b. 07/2016 CABG x 4 (LIMA->LAD, VG->Diag, VG->OM, VG->RCA); c. 08/2016 Cath: 3VD w/ 4/4 patent grafts. LAD distal to LIMA has diff dzs->Med rx; d. 08/2020 Cath: 4/4 patent grafts, native 3VD. EF 55-65%-->Med Rx.   Carotid arterial disease ([place])    a. 07/2016 s/p R CEA; b. 02/2021 U/S: RICA 123456, LICA [id number].   Clotting disorder ([place])    Depression    Diastolic dysfunction    a. 06/2016 Echo: EF 50-55%, mild inf wall HK, GR1DD, mild MR, RV sys fxn nl, mildly dilated LA, PASP nl   Fatty liver disease, nonalcoholic [id number]   History of blood transfusion    with heart surgery   HLD (hyperlipidemia)    Labile hypertension    a. prior renal ngiogram negative for RAS in 03/2016; b. catecholamines and metanephrines normal, mildly elevated renin with normal aldosterone and normal ratio in 02/2016   Myocardial infarction [facility]) 2017   Obesity    PAD  (peripheral artery disease) ([place])    a. 09/2018 s/p L SFA stenting; b. 07/2019 Periph Angio: Patent m/d L SFA stent w/ 100% L SFA distal to stent. L AT 100d, L Peroneal diff dzs-->Med Rx; c. 02/2021 ABIs: stable @ 0.61 on R and 0.46 on L.   PTSD (post-traumatic stress disorder)    Tobacco abuse    Type 2 diabetes mellitus ([place]) 12/2015    Past Surgical History:  Procedure Laterality Date   ABDOMINAL AORTOGRAM W/LOWER EXTREMITY [name] 10/15/2018   Procedure: ABDOMINAL AORTOGRAM W/LOWER EXTREMITY;  Surgeon: Wellington Hampshire, MD;  Location: [place] CV LAB;  Service: Cardiovascular;  Laterality: [name];   ABDOMINAL AORTOGRAM W/LOWER EXTREMITY Bilateral 08/19/2019   Procedure: ABDOMINAL AORTOGRAM W/LOWER EXTREMITY;  Surgeon: Wellington Hampshire, MD;  Location: [place] [facility];  Service: Cardiovascular;  Laterality: Bilateral;   CARDIAC CATHETERIZATION [name] 06/29/2016   Procedure: Left Heart Cath and Coronary Angiography;  Surgeon: Minna Merritts, MD;  Location: [place] [facility];  Service: Cardiovascular;  Laterality: [name];   CARDIAC CATHETERIZATION [name] 08/29/2016   Procedure: Left Heart Cath and Cors/Grafts Angiography;  Surgeon: Wellington Hampshire, MD;  Location: [place] [facility];  Service: Cardiovascular;  Laterality: [name];  CESAREAN SECTION     CHOLECYSTECTOMY     CORONARY ARTERY BYPASS GRAFT [name] 07/06/2016   Procedure: CORONARY ARTERY BYPASS GRAFTING (CABG) x four, using left internal mammary artery and right leg greater saphenous vein harvested endoscopically;  Surgeon: Ivin Poot, MD;  Location: [place];  Service: Open Heart Surgery;  Laterality: [name];   ENDARTERECTOMY Right 07/06/2016   Procedure: ENDARTERECTOMY CAROTID;  Surgeon: Rosetta Posner, MD;  Location[facility] OR;  Service: Vascular;  Laterality: Right;   ENDARTERECTOMY Right 04/27/2020   Procedure: REDO OF RIGHT ENDARTERECTOMY CAROTID;  Surgeon: Rosetta Posner, MD;  Location: [facility] OR;  Service: Vascular;  Laterality: Right;   LEFT HEART CATH  AND CORS/GRAFTS ANGIOGRAPHY [name] 08/24/2020   Procedure: LEFT HEART CATH AND CORS/GRAFTS ANGIOGRAPHY;  Surgeon: Wellington Hampshire, MD;  Location: [place] CV LAB;  Service: Cardiovascular;  Laterality: [name];   PERIPHERAL VASCULAR CATHETERIZATION [name] 04/18/2016   Procedure: Renal Angiography;  Surgeon: Wellington Hampshire, MD;  Location: [place] CV LAB;  Service: Cardiovascular;  Laterality: [name];   PERIPHERAL VASCULAR INTERVENTION Left 10/15/2018   Procedure: PERIPHERAL VASCULAR INTERVENTION;  Surgeon: Wellington Hampshire, MD;  Location: [place] [facility] LAB;  Service: Cardiovascular;  Laterality: Left;  Left superficial femoral   TEE WITHOUT CARDIOVERSION [name] 07/06/2016   Procedure: TRANSESOPHAGEAL ECHOCARDIOGRAM (TEE);  Surgeon: Ivin Poot, MD;  Location: [place];  Service: Open Heart Surgery;  Laterality: [name];   TONSILLECTOMY      There were no vitals filed for this visit.   Subjective Assessment - 08/03/21 1604     Subjective "They call me impulsive."    Patient is accompained by: Family member   husband [name]   Currently in Pain? Yes    Pain Score 6     Pain Location Head                SLP Evaluation [facility] - 08/04/21 1132       SLP Visit Information   SLP Received On 08/03/21    Referring Provider (SLP) Izora Ribas, MD    Onset Date 06/24/21    Medical Diagnosis CVA      General Information   HPI Erika [name] is a 50 y.o. female admitted to [facility] 06/24/21 with slurred speech, left facial droop and LUE weakness; MRI showed acute R pontine CVA and chronic R parietal and occipital infarcts. Past medical history is noted for HTN, HLD, DM II, PAD, MI, CAD, s/p CABG, CHF, PTSD, adjustment disorder, obesity. Patient worked with [facility] in [place] 06/28/21-08/01/21. SLP targeted cognitive deficits and dysarthria, as well as dysphagia; upgraded to regular/thin by time of d/c home.    Behavioral/Cognition impulsive, flat affect    Mobility Status arrives in wheelchair      Balance  Screen   Has the patient fallen in the past 6 months Yes   she is currently on PT caseload   How many times? reports fall this morning during transfer      Prior Functional Status   Cognitive/Linguistic Baseline Within functional limits    Type of Home House     Lives With Spouse;Daughter    Available Support Family;Available 24 hours/day    Education graduated HS and took 2 years college      Cognition   Overall Cognitive Status Impaired/Different from baseline    Attention Selective   and higher level   Selective Attention Impaired    Selective Attention Impairment --   symbol  cancellation errors; pt and spouse report she has difficulty concentrating   Memory Impaired    Memory Impairment Decreased short term memory;Prospective memory;Decreased recall of new information    Decreased Short Term Memory Verbal basic   recalled less than 50% of details from story; also somewhat confabulatory   Awareness Impaired    Awareness Impairment Intellectual impairment;Emergent impairment   shows fluctuating awareness; some emergent awareness of errors, but also has reduced awareness of/insight into deficits   Problem Solving Impaired    Problem Solving Impairment Functional basic   needed moderate cues to problem-solve how to correct errors in clock drawing/mazes   Executive Function Organizing;Reasoning;Decision Making;Self Monitoring;Self Correcting    Behaviors Impulsive;Confabulation      Auditory Comprehension   Overall Auditory Comprehension Other (comment)   primarily due to attention   Multistep Basic Commands 75-100% accurate    Conversation Simple    Other Conversation Comments Answers questions/responds before communication partner completes turn    Interfering Components Attention;Processing speed    EffectiveTechniques Repetition      Psychologist, counselling Not tested      Reading Comprehension   Reading Status Not tested      Expression    Primary Mode of Expression Verbal      Verbal Expression   Overall Verbal Expression Impaired    Initiation No impairment    Level of Generative/Spontaneous Verbalization Conversation    Naming Impairment    Divergent 75-100% accurate    Pragmatics Impairment    Impairments Turn Taking;Abnormal affect;Topic maintenance    Interfering Components Attention    Effective Techniques Other (Comment)   verbal cues     Written Expression   Dominant Hand Right    Written Expression Not tested      Oral Motor/Sensory Function   Overall Oral Motor/Sensory Function Impaired    Labial ROM Reduced left    Labial Symmetry Abnormal symmetry left    Lingual Symmetry Abnormal symmetry left    Lingual Strength Reduced Left    Facial ROM Reduced left    Facial Symmetry Left droop    Velum Within Functional Limits    Mandible Within Functional Limits      Motor Speech   Overall Motor Speech Impaired    Respiration Within functional limits    Phonation Normal    Resonance Within functional limits    Articulation Impaired    Level of Impairment Sentence    Intelligibility Intelligibility reduced    Word 75-100% accurate    Phrase 75-100% accurate    Sentence 75-100% accurate    Conversation 75-100% accurate   ~90% in quiet environment to trained listener   Motor Planning Witnin functional limits    Phonation WFL      Standardized Assessments   Standardized Assessments  Cognitive Linguistic Quick Test               Cognitive Linguistic Quick Test: AGE - 18 - 19   The Cognitive Linguistic Quick Test (CLQT) was administered to assess the relative status of five cognitive domains: attention, memory, language, executive functioning, and visuospatial skills. Scores from 10 tasks were used to estimate severity ratings (standardized for age groups 18-69 years and 70-89 years) for each domain, a clock drawing task, as well as an overall composite severity rating of cognition.       Task  Score Criterion Cut Scores  Personal Facts 8/8 8  Symbol Cancellation 11/12 11  Confrontation Naming 10/10 10  Clock Drawing  11/13 12  Story Retelling 5/10 6  Symbol Trails 10/10 9  Generative Naming 4/9 5  Design Memory 4/6 5  Mazes  4/8 7  Design Generation 4/13 6    Cognitive Domain Composite Score Severity Rating  Attention 167/215 Mild  Memory 130/185 Moderate  Executive Function 22/40 Mild  Language 27/37 Mild  Visuospatial Skills 74/105 Mild  Clock Drawing  11/13 Mild  Composite Severity Rating 2.8 Mild      SLP Short Term Goals - 08/04/21 1246       SLP SHORT TERM GOAL #1   Title Pt will verbalize awareness of impulsivity in simple tasks >80% of the time with min cues.    Time 10    Period --   sessions   Status New      SLP SHORT TERM GOAL #2   Title Pt will ID out-of-turn responses >80% with non-verbal cues.    Time 10    Period --   sessions   Status New      SLP SHORT TERM GOAL #3   Title Pt will verbalize steps to safety for transfers/gait training to reduce impulsive/unsafe movement.    Time 10    Period --   sessions   Status New      SLP SHORT TERM GOAL #4   Title Pt will verbalize daily schedule, recent events and salient information using external memory aids.    Time 10    Period --   sessions   Status New      SLP SHORT TERM GOAL #5   Title Generate written or verbal cues to improve accuracy of 3 house hold tasks or safety.    Time 10    Period --   sessions   Status New              SLP Long Term Goals - 08/04/21 1334       SLP LONG TERM GOAL #1   Title Pt will verbalize or write steps prior to task initiation >80% with modified independence.    Time 12    Period Weeks    Status New    Target Date 11/01/21      SLP LONG TERM GOAL #2   Title Pt will demonstrate appropriate turn-taking >90% of the time in 15 minutes mod complex conversation.    Time 12    Period Weeks    Status New    Target Date 11/01/21      SLP  LONG TERM GOAL #3   Title Pt will use external aids to organize/recall appointments and medical information for herself and her daughter.    Time 12    Period Weeks    Status New    Target Date 11/01/21      SLP LONG TERM GOAL #4   Title Pt will plan, organize and present a 20-30 minute lesson on personal or gun safety using external aids/compensations    Time 12    Period Weeks    Status New    Target Date 11/01/21      SLP LONG TERM GOAL #5   Title Patient will use compensations for dysarthria in 20 minute mod complex conversation outside of [place] room for >95% intelligibility.    Time 12    Period Weeks    Status New    Target Date 11/01/21              Plan - 08/04/21 1202  Clinical Impression Statement Erika [name] presents with overall mild-moderate cognitive communication deficits with impairments in attention, memory, problem solving, language, and executive function. Cognitive-Linguistic Quick Test was completed with overall severity rating of mild. She also has mild dysarthria characterized by imprecise articulation impacting intelligibility at conversation level. Pragmatics in conversation are impaired; pt is highly impulsive and repeatedly cuts off speaker in conversation by responding before utterance is completed. Pt also has reduced awareness of and insight into deficits, however she was responsive to error confrontation and subsequently made (inconsistent) attempts to adjust her approach. Patient's self rating of cognitive deficits (80/125) differed signficantly from spouse's rating (54/125), with higher score indicating less perceived difficulty. 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 Recommend skilled ST  to maximize cognitive and communication abilities to increase safety, independence, and life participation.    Speech Therapy Frequency 2x / week    Duration 12 weeks   20 visits allowed per calendar year per ins   Treatment/Interventions Language facilitation;Environmental controls;Cueing hierarchy;SLP instruction and feedback;Compensatory techniques;Cognitive reorganization;Functional tasks;Compensatory strategies;Internal/external aids;Multimodal communcation approach;Patient/family education    Potential to Achieve Goals Good    SLP Home Exercise Plan notebook/binder for therapies    Consulted and Agree with Plan of Care Patient;Family member/caregiver             Patient will benefit from skilled therapeutic intervention in order to improve the following deficits and impairments:   Cognitive communication deficit  Dysarthria and anarthria  Right pontine stroke [facility])    Problem List Patient Active Problem List   Diagnosis Date Noted   GERD (gastroesophageal reflux disease) 08/03/2021   Chronic post-traumatic stress disorder (PTSD)    Depression with anxiety    Right pontine stroke ([place]) 06/28/2021   Stroke ([place]) 06/24/2021   Chronic diastolic CHF (congestive heart failure) ([place]) 06/24/2021   Fall 06/24/2021   Abnormal LFTs 06/24/2021   Acute respiratory failure with hypoxia ([place]) 06/24/2021   Olecranon bursitis of left elbow 06/12/2021   Chronic hip pain (Bilateral) 06/09/2021   Chronic use of opiate for therapeutic purpose 05/09/2021   Greater trochanteric bursitis of hip (Left) 03/09/2021   Bursitis of hip (Left) [id number]   Uncomplicated opioid dependence ([place]) 02/20/2021   Acute conjunctivitis of left eye 01/02/2021   Unintentional weight loss 11/21/2020   Broken teeth (Right) 08/02/2020   History of MI (myocardial infarction) (July 2017) 08/02/2020   Neuropathy 06/09/2020   Dental abscess 06/09/2020   Foot drop (Left) 06/09/2020   Carotid stenosis,  asymptomatic, right 04/27/2020   Chronic migraine 11/03/2019   Pyelonephritis due to Escherichia coli 10/26/2019   Thrombocytosis 09/16/2019   Erythrocytosis 09/16/2019   Hypercalcemia 09/06/2019   Leukocytosis 09/06/2019   Polycythemia 09/06/2019   Chronic hip pain (Left) 08/27/2019   Osteoarthritis of hip (Left) 08/27/2019   Gluteal tendonitis of buttock (Left) 08/27/2019   Radial nerve palsy (Right) 07/15/2019   Neuropathy of radial nerve (Right) 06/30/2019   Abnormal bruising 06/25/2019   History of carotid endarterectomy (Right) 03/04/2019   Pain medication agreement signed 03/04/2019   Atypical facial pain (Right) 01/27/2019   Chronic ear pain (Right) 01/27/2019   Chronic jaw pain (Right) 01/27/2019   Geniculate Neuralgia (Right) 01/27/2019   Vitamin D deficiency 01/19/2019   Neurogenic pain 01/19/2019  Chronic anticoagulation (PLAVIX) 01/19/2019   Chronic pain syndrome 01/07/2019   Long term current use of opiate analgesic 01/07/2019   Long term prescription benzodiazepine use 01/07/2019   Pharmacologic therapy 01/07/2019   Disorder of skeletal system 01/07/2019   Problems influencing health status 01/07/2019   Chronic headaches (1ry area of Pain) (Right) 01/07/2019   Opiate use 09/24/2018   PVD (peripheral vascular disease) ([place]) 06/24/2018   Chronic ankle pain (Bilateral) 12/27/2017   Tendinopathy of gluteus medius (Right) 12/27/2017   Tendinopathy of gluteus medius (Left) 12/27/2017   Bilateral hip pain 12/26/2017   Chronic elbow pain (Left) 10/17/2017   Elevated troponin I level 05/21/2017   Severe sepsis ([place]) 05/19/2017   Major depressive disorder, recurrent episode, moderate ([place]) 11/19/2016   Insomnia 10/30/2016   Constipation 07/25/2016   S/P CABG x 4 07/06/2016   Bradycardia    CAD (coronary artery disease)    Carotid stenosis    CAD in native artery 06/29/2016   Elevated troponin 06/28/2016   Essential hypertension, malignant 06/28/2016   Tobacco  abuse 06/28/2016   Essential hypertension    Malignant hypertension    Type 2 diabetes mellitus with other specified complication ([place])    Chest pain with high risk for cardiac etiology 06/27/2016   NSTEMI (non-ST elevated myocardial infarction) ([place]) 06/27/2016   Proteinuria 03/15/2016   Renal artery stenosis ([place]) 03/15/2016   MDD (major depressive disorder) 10/17/2015   Agoraphobia with panic attacks 04/25/2015   HTN (hypertension), malignant 10/20/2013   Cluster headache 03/20/2012   HLD (hyperlipidemia) 07/26/2010   ADJUSTMENT DISORDER WITH MIXED FEATURES 07/26/2010   Deneise Lever, [place], [place] Speech-Language Pathologist  Aliene Altes 08/04/2021, 1:37 PM  [facility] [address] [place], [place], [address] Phone: [phone]   Fax:  [phone]  Name: Erika [name] MRN: [id number] Date of Birth: 10-Oct-1971

## 2021-08-04 NOTE — Therapy (Signed)
[place] MAIN [facility] SERVICES [address] [place], [place], [address] Phone: [phone]   Fax:  [phone]  Occupational Therapy Evaluation  Patient Details  Name: Erika [name] MRN: [id number] Date of Birth: 23-Apr-1971 Referring Provider (OT): Dr. Leeroy Cha   Encounter Date: 08/03/2021   OT End of Session - 08/04/21 0911     Visit Number 1    Number of Visits 20    Date for OT Re-Evaluation 10/25/21    Authorization Type limited to 20 visits combined/multidiscipline    Authorization Time Period Reporting period starting 08/03/2021    OT Start Time 1505    OT Stop Time 1555    OT Time Calculation (min) 50 min    Equipment Utilized During Treatment wc    Activity Tolerance Patient tolerated treatment well    Behavior During Therapy [facility] for tasks assessed/performed;Flat affect;Impulsive             Past Medical History:  Diagnosis Date   Arthralgia of temporomandibular joint    CAD, multiple vessel    a. 06/2016 Cath: ostLM 40%, ostLAD 40%, pLAD 95%, ost-pLCx 60%, pLCx 95%, mLCx 60%, mRCA 95%, D2 50%, LVSF nl;  b. 07/2016 CABG x 4 (LIMA->LAD, VG->Diag, VG->OM, VG->RCA); c. 08/2016 Cath: 3VD w/ 4/4 patent grafts. LAD distal to LIMA has diff dzs->Med rx; d. 08/2020 Cath: 4/4 patent grafts, native 3VD. EF 55-65%-->Med Rx.   Carotid arterial disease ([place])    a. 07/2016 s/p R CEA; b. 02/2021 U/S: RICA 123456, LICA 123456.   Clotting disorder ([place])    Depression    Diastolic dysfunction    a. 06/2016 Echo: EF 50-55%, mild inf wall HK, GR1DD, mild MR, RV sys fxn nl, mildly dilated LA, PASP nl   Fatty liver disease, nonalcoholic Q000111Q   History of blood transfusion    with heart surgery   HLD (hyperlipidemia)    Labile hypertension    a. prior renal ngiogram negative for RAS in 03/2016; b. catecholamines and metanephrines normal, mildly elevated renin with normal aldosterone and normal ratio in 02/2016   Myocardial infarction [facility])  2017   Obesity    PAD (peripheral artery disease) ([place])    a. 09/2018 s/p L SFA stenting; b. 07/2019 Periph Angio: Patent m/d L SFA stent w/ 100% L SFA distal to stent. L AT 100d, L Peroneal diff dzs-->Med Rx; c. 02/2021 ABIs: stable @ 0.61 on R and 0.46 on L.   PTSD (post-traumatic stress disorder)    Tobacco abuse    Type 2 diabetes mellitus ([place]) 12/2015    Past Surgical History:  Procedure Laterality Date   ABDOMINAL AORTOGRAM W/LOWER EXTREMITY [name] 10/15/2018   Procedure: ABDOMINAL AORTOGRAM W/LOWER EXTREMITY;  Surgeon: Wellington Hampshire, MD;  Location: [place] CV LAB;  Service: Cardiovascular;  Laterality: [name];   ABDOMINAL AORTOGRAM W/LOWER EXTREMITY Bilateral 08/19/2019   Procedure: ABDOMINAL AORTOGRAM W/LOWER EXTREMITY;  Surgeon: Wellington Hampshire, MD;  Location: [place] [facility];  Service: Cardiovascular;  Laterality: Bilateral;   CARDIAC CATHETERIZATION [name] 06/29/2016   Procedure: Left Heart Cath and Coronary Angiography;  Surgeon: Minna Merritts, MD;  Location: [place] [facility] LAB;  Service: Cardiovascular;  Laterality: [name];   CARDIAC CATHETERIZATION [name] 08/29/2016   Procedure: Left Heart Cath and Cors/Grafts Angiography;  Surgeon: Wellington Hampshire, MD;  Location: [place] [facility] LAB;  Service: Cardiovascular;  Laterality: [name];   CESAREAN SECTION     CHOLECYSTECTOMY     CORONARY ARTERY  BYPASS GRAFT [name] 07/06/2016   Procedure: CORONARY ARTERY BYPASS GRAFTING (CABG) x four, using left internal mammary artery and right leg greater saphenous vein harvested endoscopically;  Surgeon: Ivin Poot, MD;  Location: [place];  Service: Open Heart Surgery;  Laterality: [name];   ENDARTERECTOMY Right 07/06/2016   Procedure: ENDARTERECTOMY CAROTID;  Surgeon: Rosetta Posner, MD;  Location: [facility] OR;  Service: Vascular;  Laterality: Right;   ENDARTERECTOMY Right 04/27/2020   Procedure: REDO OF RIGHT ENDARTERECTOMY CAROTID;  Surgeon: Rosetta Posner, MD;  Location: [facility] OR;  Service: Vascular;  Laterality:  Right;   LEFT HEART CATH AND CORS/GRAFTS ANGIOGRAPHY [name] 08/24/2020   Procedure: LEFT HEART CATH AND CORS/GRAFTS ANGIOGRAPHY;  Surgeon: Wellington Hampshire, MD;  Location: [place] [facility] LAB;  Service: Cardiovascular;  Laterality: [name];   PERIPHERAL VASCULAR CATHETERIZATION [name] 04/18/2016   Procedure: Renal Angiography;  Surgeon: Wellington Hampshire, MD;  Location: [place] CV LAB;  Service: Cardiovascular;  Laterality: [name];   PERIPHERAL VASCULAR INTERVENTION Left 10/15/2018   Procedure: PERIPHERAL VASCULAR INTERVENTION;  Surgeon: Wellington Hampshire, MD;  Location: [place] [facility] LAB;  Service: Cardiovascular;  Laterality: Left;  Left superficial femoral   TEE WITHOUT CARDIOVERSION [name] 07/06/2016   Procedure: TRANSESOPHAGEAL ECHOCARDIOGRAM (TEE);  Surgeon: Ivin Poot, MD;  Location: [place];  Service: Open Heart Surgery;  Laterality: [name];   TONSILLECTOMY      There were no vitals filed for this visit.   Subjective Assessment - 08/03/21 0839     Subjective  "I got home from rehab 2 days ago."    Patient is accompanied by: Family member    Pertinent History HX of CAD, CABG, DM2, s/p R pontine stroke    Limitations L sided hemiplegia, impulsivity, judgment, safety awareness    Patient Stated Goals "I want to be able to take care of myself."    Currently in Pain? No/denies    Pain Score 0-No pain    Pain Onset More than a month ago               [facility] OT Assessment - 08/04/21 0845       Assessment   Medical Diagnosis R pontine stroke    Referring Provider (OT) Dr. Leeroy Cha    Onset Date/Surgical Date 06/24/21    Hand Dominance Right    Next MD Visit 08/09/2021 for rehab MD    Prior Therapy inpatient rehab 1 month      Precautions   Precautions Fall    Other Brace/Splint L AFO      Balance Screen   Has the patient fallen in the past 6 months Yes    How many times? 4 d/t previous issue with drop foot (L)    Has the patient had a decrease in activity level because of a fear  of falling?  No    Is the patient reluctant to leave their home because of a fear of falling?  No      Home  Environment   Family/patient expects to be discharged to: Private residence    Living Arrangements Spouse/significant other    Available Help at Discharge Family    Type of [place] entrance    [place] One level    Bathroom Shower/Tub Tub/Shower unit    Bathroom Accessibility No    How accessible Other (Comment)   wc does not fit into bathroom; pt will have to use hemiwalker  Adaptive equipment [place] Bedside commode;Tub bench;Hand held shower head;Wheelchair - manual    Additional Comments Planning to install grab bars in tub and at toilet    Lives With Family   spouse, 1 adult child with disabilities ([employer]bergers's); mother-in-law comes daily to assist pt while spouse works     Prior Function   Level of Teacher, 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    Aeronautical engineer, volunteered, taught situational awareness and defensive shooting; 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    Leisure read, shoot, cook/bake      ADL   Eating/Feeding Set up    Grooming Minimal assistance    Upper Body Bathing Moderate assistance    Lower Body Bathing Moderate assistance    Upper Body Dressing Moderate assistance    Lower Body Dressing Maximal assistance    Toilet Transfer Maximal assistance;Minimal assistance    Tour manager -  Hygiene Moderate assistance    ADL comments Pt is sink bathing as she is not yet able to amb with hemiwalker into bathroom and wc does not fit through bathroom doorway      IADL   Prior Level of Function Meal Prep Indep; enjoyed cooking and baking prior to CVA    Meal Prep Needs to have meals prepared and served      Mobility   Mobility Status Comments wc with assist to maneuver around corners and narrow doorways      Written  Expression   Dominant Hand Right      Vision - History   Baseline Vision Wears glasses only for reading    Additional Comments Pt reports increased blurred vision since CVA      Vision Assessment   Ocular Range of Motion Within Functional Limits    Tracking/Visual Pursuits Able to track stimulus in all quads without difficulty    Saccades Additional eye shifts occurred during testing    Visual Fields No apparent deficits      Cognition   Overall Cognitive Status Impaired/Different from baseline    Awareness Impaired    Behaviors Impulsive      Observation/Other Assessments   Skin Integrity no reported sink breakdown    Focus on Therapeutic Outcomes (FOTO)  23      Sensation   Light Touch Appears Intact      Coordination   Gross Motor Movements are Fluid and Coordinated No    Fine Motor Movements are Fluid and Coordinated No    Coordination and Movement Description Not currently able to use LUE for functional activity    Right 9 Hole Peg Test 43 sec    Left 9 Hole Peg Test unable      Praxis   Praxis Impaired    Praxis Impairment Details Motor planning    Praxis-Other Comments No volitional movement in LUE      Tone   Assessment Location Left Upper Extremity      AROM   Overall AROM Comments RUE WNL; LUE unable; LUE Passive shoulder flex/abd 0-90 with ease, elbow, wrist, hand passively within WNL      Strength   Overall Strength Comments RUE 4/5, L shoulder 1/5, L elbow/wrist/hand 0/5      Hand Function   Right Hand Grip (lbs) 36    Right Hand Lateral Pinch 12 lbs    Right Hand 3 Point Pinch 11 lbs    Left  Hand Grip (lbs) unable    Left Hand Lateral Pinch 0 lbs   unable to hold dynamometer   Left 3 point pinch 0 lbs   unable to hold pinch gauge     LUE Tone   LUE Tone Modified Ashworth      LUE Tone   Modified Ashworth Scale for Grading Hypertonia LUE Slight increase in muscle tone, manifested by a catch and release or by minimal resistance at the end of the  range of motion when the affected part(s) is moved in flexion or extension   Only wrist/hand           Occupational Therapy Evaluation: Pt is a 50 y/o female, s/p R pontine stroke.  Prior to CVA, pt was on disability related to cardiac hx with CABG, but fully indep with ADLs and IADLs prior to CVA.  Pt had inpatient rehab for 1 month and was discharged home 2 days ago where she lives with her spouse and adult daughter.  Pt's mother-in-law is pt's primary caregiver when spouse is at work.  Currently pt requires 24/7 supv.  Pt presents with dense LUE hemiplegia, intact sensation, mild distal flexor tone, but otherwise proximal flaccidity in LUE, decreased safety and impaired awareness into her deficits.  Pt reports increase in blurred vision since CVA, with slight shifts during smooth pursuits, but otherwise able to scan to all 4 quadrants and peripheral vision appears intact.  Pt's goal is to be able to take care of herself, walk, and be able to cook for her family.  Pt reports that cooking and baking is the way that she feels she can give back to her family.  Pt will benefit from skilled OT to provide neuro re-ed to LUE, ADL training, and functional ADL transfer training in order to maximize indep and safety in the home.   Therapeutic Exercise: Instruction in self PROM to LUE, ensuring no more than 0-90 for L shoulder flexion, passive elbow flex/ext, wrist and digit flexion extension; return demo with moderate cues.  Encouraged completion daily.      OT Education - 08/04/21 0910     Education Details PROM to LUE    Person(s) Educated Patient;Spouse    Methods Explanation;Demonstration;Verbal cues    Comprehension Verbalized understanding;Returned demonstration;Need further instruction;Verbal cues required              OT Short Term Goals - 08/04/21 0914       OT SHORT TERM GOAL #1   Title Pt will be perform HEP for LUE with min vc.    Baseline Eval: Initiated at eval, further  instruction needed    Time 6    Period Weeks    Status New    Target Date 09/13/21               OT Long Term Goals - 08/04/21 0915       OT LONG TERM GOAL #1   Title Pt will increase FOTO score to 40 or better to indicate increased functional performance    Baseline Eval: 23    Time 12    Period Weeks    Status New    Target Date 10/25/21      OT LONG TERM GOAL #2   Title Pt will perform UB ADLs with set up    Baseline Eval: Mod A to perform UB ADLs    Time 12    Period Weeks    Status New    Target Date 10/25/21  OT LONG TERM GOAL #3   Title Pt will perform LB ADLs with min A    Baseline Eval: Max A to perform LB ADLs    Time 12    Period Weeks    Status New    Target Date 10/25/21      OT LONG TERM GOAL #4   Title Pt will perform ADL transfers with distant supv    Baseline Eval: min A-mod A to perform ADL transfers    Time 12    Period Weeks    Status New    Target Date 10/25/21      OT LONG TERM GOAL #5   Title Pt will increase LUE strength by 2 MM grades to work towards use of LUE as a stabilizer for self care.    Baseline Eval: L shoulder 1/5, elbow/wrist/hand 0/5.  Unable to use LUE as a stabilzer for self care.    Time 12    Period Weeks    Status New    Target Date 10/25/21                   Plan - 08/04/21 1122     Clinical Impression Statement Pt is a 50 y/o female, s/p R pontine stroke.  Prior to CVA, pt was on disability related to cardiac hx with CABG, but fully indep with ADLs and IADLs prior to CVA.  Pt had inpatient rehab for 1 month and was discharged home 2 days ago where she lives with her spouse and adult daughter.  Pt's mother-in-law is pt's primary caregiver when spouse is at work.  Currently pt requires 24/7 supv.  Pt presents with dense LUE hemiplegia, intact sensation, mild distal flexor tone, but otherwise proximal flaccidity in LUE, decreased safety and impaired awareness into her deficits.  Pt reports increase in  blurred vision since CVA, with slight shifts during smooth pursuits, but otherwise able to scan to all 4 quadrants and peripheral vision appears intact.  Pt's goal is to be able to take care of herself, walk, and be able to cook for her family.  Pt reports that cooking and baking is the way that she feels she can give back to her family.  Pt will benefit from skilled OT to provide neuro re-ed to LUE, ADL training, and functional ADL transfer training in order to maximize indep and safety in the home.    OT Occupational Profile and History Detailed Assessment- Review of Records and additional review of physical, cognitive, psychosocial history related to current functional performance    Occupational performance deficits (Please refer to evaluation for details): ADL's;IADL's;Leisure;Work    Marketing executive / Function / Physical Skills ADL;Continence;Dexterity;Strength;Vision;Balance;Coordination;FMC;IADL;Body mechanics;Endurance;Gait;UE functional use;Decreased knowledge of use of DME;GMC;Mobility    Rehab Potential Good    Clinical Decision Making Several treatment options, min-mod task modification necessary    Comorbidities Affecting Occupational Performance: May have comorbidities impacting occupational performance    Modification or Assistance to Complete Evaluation  Min-Moderate modification of tasks or assist with assess necessary to complete eval    OT Frequency 2x / week    OT Duration 12 weeks    OT Treatment/Interventions Self-care/ADL training;Therapeutic exercise;DME and/or AE instruction;Functional Mobility Training;Balance training;Electrical Stimulation;Neuromuscular education;Manual Therapy;Splinting;Visual/perceptual remediation/compensation;Moist Heat;Passive range of motion;Therapeutic activities;Patient/family education    Consulted and Agree with Plan of Care Patient;Family member/caregiver    Family Member Consulted spouse, Catalina [place]             Patient will benefit from skilled  therapeutic intervention in order to improve the following deficits and impairments:   Body Structure / Function / Physical Skills: ADL, Continence, Dexterity, Strength, Vision, Balance, Coordination, [place], IADL, Body mechanics, Endurance, Gait, UE functional use, Decreased knowledge of use of DME, GMC, Mobility       Visit Diagnosis: Hemiplegia and hemiparesis following cerebral infarction affecting left non-dominant side (HCC)  Muscle weakness (generalized)  Other lack of coordination    Problem List Patient Active Problem List   Diagnosis Date Noted   GERD (gastroesophageal reflux disease) 08/03/2021   Chronic post-traumatic stress disorder (PTSD)    Depression with anxiety    Right pontine stroke ([place]) 06/28/2021   Stroke ([place]) 06/24/2021   Chronic diastolic CHF (congestive heart failure) ([place]) 06/24/2021   Fall 06/24/2021   Abnormal LFTs 06/24/2021   Acute respiratory failure with hypoxia (HCC) 06/24/2021   Olecranon bursitis of left elbow 06/12/2021   Chronic hip pain (Bilateral) 06/09/2021   Chronic use of opiate for therapeutic purpose 05/09/2021   Greater trochanteric bursitis of hip (Left) 03/09/2021   Bursitis of hip (Left) Q000111Q   Uncomplicated opioid dependence ([place]) 02/20/2021   Acute conjunctivitis of left eye 01/02/2021   Unintentional weight loss 11/21/2020   Broken teeth (Right) 08/02/2020   History of MI (myocardial infarction) (July 2017) 08/02/2020   Neuropathy 06/09/2020   Dental abscess 06/09/2020   Foot drop (Left) 06/09/2020   Carotid stenosis, asymptomatic, right 04/27/2020   Chronic migraine 11/03/2019   Pyelonephritis due to Escherichia coli 10/26/2019   Thrombocytosis 09/16/2019   Erythrocytosis 09/16/2019   Hypercalcemia 09/06/2019   Leukocytosis 09/06/2019   Polycythemia 09/06/2019   Chronic hip pain (Left) 08/27/2019   Osteoarthritis of hip (Left) 08/27/2019   Gluteal tendonitis of buttock (Left) 08/27/2019   Radial nerve palsy  (Right) 07/15/2019   Neuropathy of radial nerve (Right) 06/30/2019   Abnormal bruising 06/25/2019   History of carotid endarterectomy (Right) 03/04/2019   Pain medication agreement signed 03/04/2019   Atypical facial pain (Right) 01/27/2019   Chronic ear pain (Right) 01/27/2019   Chronic jaw pain (Right) 01/27/2019   Geniculate Neuralgia (Right) 01/27/2019   Vitamin D deficiency 01/19/2019   Neurogenic pain 01/19/2019   Chronic anticoagulation (PLAVIX) 01/19/2019   Chronic pain syndrome 01/07/2019   Long term current use of opiate analgesic 01/07/2019   Long term prescription benzodiazepine use 01/07/2019   Pharmacologic therapy 01/07/2019   Disorder of skeletal system 01/07/2019   Problems influencing health status 01/07/2019   Chronic headaches (1ry area of Pain) (Right) 01/07/2019   Opiate use 09/24/2018   PVD (peripheral vascular disease) ([place]) 06/24/2018   Chronic ankle pain (Bilateral) 12/27/2017   Tendinopathy of gluteus medius (Right) 12/27/2017   Tendinopathy of gluteus medius (Left) 12/27/2017   Bilateral hip pain 12/26/2017   Chronic elbow pain (Left) 10/17/2017   Elevated troponin I level 05/21/2017   Severe sepsis (HCC) 05/19/2017   Major depressive disorder, recurrent episode, moderate (HCC) 11/19/2016   Insomnia 10/30/2016   Constipation 07/25/2016   S/P CABG x 4 07/06/2016   Bradycardia    CAD (coronary artery disease)    Carotid stenosis    CAD in native artery 06/29/2016   Elevated troponin 06/28/2016   Essential hypertension, malignant 06/28/2016   Tobacco abuse 06/28/2016   Essential hypertension    Malignant hypertension    Type 2 diabetes mellitus with other specified complication (HCC)    Chest pain with high risk for cardiac etiology 06/27/2016   NSTEMI (non-ST elevated myocardial  infarction) ([place]) 06/27/2016   Proteinuria 03/15/2016   Renal artery stenosis ([place]) 03/15/2016   MDD (major depressive disorder) 10/17/2015   Agoraphobia with panic  attacks 04/25/2015   HTN (hypertension), malignant 10/20/2013   Cluster headache 03/20/2012   HLD (hyperlipidemia) 07/26/2010   ADJUSTMENT DISORDER WITH MIXED FEATURES 07/26/2010   Leta Speller, MS, OTR/L  Darleene Cleaver 08/04/2021, 11:25 AM  [place] MAIN [facility] SERVICES [address] [place], [place], [address] Phone: [phone]   Fax:  [phone]  Name: Erika [name] MRN: [id number] Date of Birth: 1971-12-24

## 2021-08-06 ENCOUNTER — Telehealth: Payer: Self-pay | Admitting: Endocrinology

## 2021-08-06 NOTE — Telephone Encounter (Signed)
please contact patient: 1.  Please schedule f/u appt 2.  Then please let me know, so I can rx alternative to basaglar x 2 mos, pending that appt.

## 2021-08-07 ENCOUNTER — Other Ambulatory Visit: Payer: Self-pay

## 2021-08-07 ENCOUNTER — Ambulatory Visit: Payer: Managed Care, Other (non HMO) | Admitting: Physical Therapy

## 2021-08-07 ENCOUNTER — Ambulatory Visit: Payer: Managed Care, Other (non HMO) | Admitting: Occupational Therapy

## 2021-08-07 ENCOUNTER — Ambulatory Visit: Payer: Managed Care, Other (non HMO) | Admitting: Speech Pathology

## 2021-08-07 ENCOUNTER — Encounter: Payer: Self-pay | Admitting: Physical Therapy

## 2021-08-07 DIAGNOSIS — R278 Other lack of coordination: Secondary | ICD-10-CM

## 2021-08-07 DIAGNOSIS — R41841 Cognitive communication deficit: Secondary | ICD-10-CM | POA: Diagnosis not present

## 2021-08-07 DIAGNOSIS — R262 Difficulty in walking, not elsewhere classified: Secondary | ICD-10-CM

## 2021-08-07 DIAGNOSIS — M6281 Muscle weakness (generalized): Secondary | ICD-10-CM

## 2021-08-07 DIAGNOSIS — I69354 Hemiplegia and hemiparesis following cerebral infarction affecting left non-dominant side: Secondary | ICD-10-CM

## 2021-08-07 DIAGNOSIS — R471 Dysarthria and anarthria: Secondary | ICD-10-CM

## 2021-08-07 DIAGNOSIS — I6350 Cerebral infarction due to unspecified occlusion or stenosis of unspecified cerebral artery: Secondary | ICD-10-CM

## 2021-08-07 NOTE — Patient Instructions (Signed)
Memory Compensation Strategies  Use "WARM" strategy. W= write it down A=  associate it R=  repeat it M=  make a mental picture  You can keep a Memory Notebook. Use a 3-ring notebook with sections for the following:  calendar, therapies (PT, OT, Speech), important names and phone numbers, medications, doctors' names/phone numbers, "to do list"/reminders, and a section to journal what you did each day  Use a calendar to write appointments down.  Write yourself a schedule for the day.  This can be placed on the calendar or in a separate section of the Memory Notebook.  Keeping a regular schedule can help memory.  Use medication organizer with sections for each day or morning/evening pills  You may need help loading it  Keep a basket, or pegboard by the door.   Place items that you need to take out with you in the basket or on the pegboard.  You may also want to include a message board for reminders.  Use sticky notes. Place sticky notes with reminders in a place where the task is performed.  For example:  "turn off the stove" placed by the stove, "lock the door" placed on the door at eye level, "take your medications" on the bathroom mirror or by the place where you normally take your medications  Use alarms/timers.  Use while cooking to remind yourself to check on food or as a reminder to take your medicine, or as a reminder to make a call, or as a reminder to perform another task, etc.  Use a voice recorder app or small tape recorder to record important information and notes for yourself. Go back at the end of the day and listen to these.

## 2021-08-07 NOTE — Therapy (Signed)
[place] MAIN [facility] SERVICES [address] [place], [place], [address] Phone: [phone]   Fax:  [phone]  Physical Therapy Treatment  Patient Details  Name: Erika [name] MRN: [id number] Date of Birth: July 06, 1971 Referring Provider (PT): Leeroy Cha MD   Encounter Date: 08/07/2021   PT End of Session - 08/07/21 1559     Visit Number 2    Number of Visits 20    Date for PT Re-Evaluation 09/14/21    Authorization Type [employer] max visits 20x per year speech, OT, PT    Authorization Time Period 08/03/21-10/26/21    Progress Note Due on Visit 10    PT Start Time 1436    PT Stop Time 1517    PT Time Calculation (min) 41 min    Equipment Utilized During Treatment Gait belt;Other (comment)   right AFO   Activity Tolerance Patient tolerated treatment well    Behavior During Therapy [facility] for tasks assessed/performed;Flat affect;Impulsive             Past Medical History:  Diagnosis Date   Arthralgia of temporomandibular joint    CAD, multiple vessel    a. 06/2016 Cath: ostLM 40%, ostLAD 40%, pLAD 95%, ost-pLCx 60%, pLCx 95%, mLCx 60%, mRCA 95%, D2 50%, LVSF nl;  b. 07/2016 CABG x 4 (LIMA->LAD, VG->Diag, VG->OM, VG->RCA); c. 08/2016 Cath: 3VD w/ 4/4 patent grafts. LAD distal to LIMA has diff dzs->Med rx; d. 08/2020 Cath: 4/4 patent grafts, native 3VD. EF 55-65%-->Med Rx.   Carotid arterial disease ([place])    a. 07/2016 s/p R CEA; b. 02/2021 U/S: RICA 123456, LICA 123456.   Clotting disorder ([employer])    Depression    Diastolic dysfunction    a. 06/2016 Echo: EF 50-55%, mild inf wall HK, GR1DD, mild MR, RV sys fxn nl, mildly dilated LA, PASP nl   Fatty liver disease, nonalcoholic Q000111Q   History of blood transfusion    with heart surgery   HLD (hyperlipidemia)    Labile hypertension    a. prior renal ngiogram negative for RAS in 03/2016; b. catecholamines and metanephrines normal, mildly elevated renin with normal aldosterone and normal ratio  in 02/2016   Myocardial infarction [facility]) 2017   Obesity    PAD (peripheral artery disease) ([place])    a. 09/2018 s/p L SFA stenting; b. 07/2019 Periph Angio: Patent m/d L SFA stent w/ 100% L SFA distal to stent. L AT 100d, L Peroneal diff dzs-->Med Rx; c. 02/2021 ABIs: stable @ 0.61 on R and 0.46 on L.   PTSD (post-traumatic stress disorder)    Tobacco abuse    Type 2 diabetes mellitus ([place]) 12/2015    Past Surgical History:  Procedure Laterality Date   ABDOMINAL AORTOGRAM W/LOWER EXTREMITY [name] 10/15/2018   Procedure: ABDOMINAL AORTOGRAM W/LOWER EXTREMITY;  Surgeon: Wellington Hampshire, MD;  Location: [place] CV LAB;  Service: Cardiovascular;  Laterality: [name];   ABDOMINAL AORTOGRAM W/LOWER EXTREMITY Bilateral 08/19/2019   Procedure: ABDOMINAL AORTOGRAM W/LOWER EXTREMITY;  Surgeon: Wellington Hampshire, MD;  Location: [place] CV LAB;  Service: Cardiovascular;  Laterality: Bilateral;   CARDIAC CATHETERIZATION [name] 06/29/2016   Procedure: Left Heart Cath and Coronary Angiography;  Surgeon: Minna Merritts, MD;  Location: [place] [facility] LAB;  Service: Cardiovascular;  Laterality: [name];   CARDIAC CATHETERIZATION [name] 08/29/2016   Procedure: Left Heart Cath and Cors/Grafts Angiography;  Surgeon: Wellington Hampshire, MD;  Location: [place] [facility] LAB;  Service: Cardiovascular;  Laterality: [name];  CESAREAN SECTION     CHOLECYSTECTOMY     CORONARY ARTERY BYPASS GRAFT [name] 07/06/2016   Procedure: CORONARY ARTERY BYPASS GRAFTING (CABG) x four, using left internal mammary artery and right leg greater saphenous vein harvested endoscopically;  Surgeon: Ivin Poot, MD;  Location: [place];  Service: Open Heart Surgery;  Laterality: [name];   ENDARTERECTOMY Right 07/06/2016   Procedure: ENDARTERECTOMY CAROTID;  Surgeon: Rosetta Posner, MD;  [facility] OR;  Service: Vascular;  Laterality: Right;   ENDARTERECTOMY Right 04/27/2020   Procedure: REDO OF RIGHT ENDARTERECTOMY CAROTID;  Surgeon: Rosetta Posner, MD;  Location: [facility]  OR;  Service: Vascular;  Laterality: Right;   LEFT HEART CATH AND CORS/GRAFTS ANGIOGRAPHY [name] 08/24/2020   Procedure: LEFT HEART CATH AND CORS/GRAFTS ANGIOGRAPHY;  Surgeon: Wellington Hampshire, MD;  Location: [place] CV LAB;  Service: Cardiovascular;  Laterality: [name];   PERIPHERAL VASCULAR CATHETERIZATION [name] 04/18/2016   Procedure: Renal Angiography;  Surgeon: Wellington Hampshire, MD;  Location: [place] CV LAB;  Service: Cardiovascular;  Laterality: [name];   PERIPHERAL VASCULAR INTERVENTION Left 10/15/2018   Procedure: PERIPHERAL VASCULAR INTERVENTION;  Surgeon: Wellington Hampshire, MD;  Location: [place] CV LAB;  Service: Cardiovascular;  Laterality: Left;  Left superficial femoral   TEE WITHOUT CARDIOVERSION [name] 07/06/2016   Procedure: TRANSESOPHAGEAL ECHOCARDIOGRAM (TEE);  Surgeon: Ivin Poot, MD;  Location: [place];  Service: Open Heart Surgery;  Laterality: [name];   TONSILLECTOMY      There were no vitals filed for this visit.   Subjective Assessment - 08/07/21 1555     Subjective Pt and her husband report they have been completing walking activites at home as it is appropriate. In addition she was able ot complete several repetitions of LAQ indicating impcoed LE muscle activation.    Pertinent History Pt presented to [facility]24[facility]21 c c/c of slurred speech, L facial droop & LUE weakness. MRI revealed acute R pontine perforator infarct with chronic R parietal & occipital infarcts. PMH: HTN, HLD, DM, tobacco abuse, PAD, CAD, CABG, dCHF, peptic foot drop, renal artery stenosis, MI and CAD (s/p of R CEA 2017), PTSD, adjustment disorder, obesity, Left hip OA s/p OPPT 2020. Pt DC 06/28/21 to inpatient rehab in [place], [place] to home 08/01/21. AT CIR pt worked extensively on STS with MinA or Supervision and HW, Stand and squat pivot transfers with MinA, AMB progressed to ~31f max, mostly with HW (some RW use with fixed LUE on handle), but was mostly limited in further progression of distance 2/2 chronic  PAD ischaemic pain or Left knee buckling despite AFO use. CIR recommending update to more supporting AFO at transfer to OPPT. Since home, pt is using WC to navigate the environment, family assists with ADL performance, WC still needs to be modified to fit through BR doorframe. Pt reports not feeling safe with HW at present. Husband has been helping with gnelte stretching in mornings as pt sleeps in a flexion synergy posture on LLE.    Limitations Walking;Standing    How long can you sit comfortably? Generally well tolerated with occasional buttocks soreness    How long can you stand comfortably? At eval: tolerates <2 minutes prior to ischemic leg pain    How long can you walk comfortably? Longest distance at CIR last month ~240f    Patient Stated Goals Return indpendent ambulation    Currently in Pain? No/denies  Treatment this session   Donned AFO with ant support from PT supply closet as this improved pt c/o knee buckling and instability -Ambulation with hemi walker, Min A from therapist to prevent loss of balance and with WC via her husband.   -Pt required frequent cues in order to facilitate proper placement of hemi walker to both allow larger step length as well as to prevent her R LE from catching on the leg of hemiwalker during swing phase of gait.   -Amulation bouts   -1:  10 ft, min A for STS, CGA for ambulation    -2: 15 feet, Min A for STS and for ambulation   -3: 25 feet Min A for STS transition, and for ambulation due to potential LOB  LAQ: x 10 with mod/min A for end range and cues for controlling eccentric portion of movement Transfer training:  : 2 x to and from wheelchair to mat table First attempt pt required min A to prevent Lob, increased time, and frequent cues in order to facilitate proper movement. Pt had many instances where her COM went outside of her BOS and she had to be manually corrected by author to prevent LOB.  Seconds attempt:  Similar assistance level on WC to mat table transfer but pt was fatigued following and had to utilize stand pivot transfer with mod A from author to return to chair.  Pt was allotted rest breaks between  sets for gait training and therapeutic exercise                  PT Education - 08/07/21 1557     Education Details Pt educated regarding continuation of exercises at home. Also educated in safe ways to practice waking within the home.    Person(s) Educated Patient;Spouse    Methods Explanation    Comprehension Verbalized understanding              PT Short Term Goals - 08/03/21 1645       PT SHORT TERM GOAL #1   Title Pt to demonstrate improved tolerance to standing >3 minutes without physical assist or LOB. DME ad lib    Baseline <2 minutes at eval    Time 4    Period Weeks    Status New    Target Date 08/31/21      PT SHORT TERM GOAL #2   Title Pt to demonstrate perfromance of 5xSTS c AFO/Hemi walker at supervision level independence without LOB or physical assist.    Baseline Needs minGuard to MinA to perform at eval    Time 4    Period Weeks    Status New    Target Date 08/31/21      PT SHORT TERM GOAL #3   Title Pt to demonstrate AMB ~69f at supervision level with AFO, HW without LOB.    Baseline At eval limited by [facility] and AFO.    Time 6    Period Weeks    Status New    Target Date 09/14/21      PT SHORT TERM GOAL #4   Title Pt to demonstrate TUG test <120sec c HW and AFO at minguard - MinA level to improve independence with transfers and reduce falls risk at home.    Baseline No tug performed at eval:    Time 7    Period Weeks    Status New    Target Date 09/21/21  PT Long Term Goals - 08/03/21 1647       PT LONG TERM GOAL #1   Title Pt to demonstrate perfromance of 5xSTS c AFO/Hemi walker at supervision level independence without LOB or physical assist in less than 20sec.    Baseline Unable at eval, requires  minguard to minA    Time 8    Period Weeks    Status New    Target Date 09/28/21      PT LONG TERM GOAL #2   Title Pt to demonstrate FOTO score improvement >10 points to demonstrate improved perception of ability in ADL    Baseline 23 at eval:    Time 8    Period Weeks    Status New    Target Date 09/28/21      PT LONG TERM GOAL #3   Title Pt able to demonstrate stand pivot transfer with HW and AFO without LOB at supervision level to modI.    Baseline Needs minGuard to MinA for safety    Time 8    Period Weeks    Status New    Target Date 09/28/21      PT LONG TERM GOAL #4   Title Pt to demonstrate TUG test <60sec c HW and AFO at supervision level to improve independence with transfers and reduce falls risk at home.    Baseline No Tug at [facility]    Time 12    Period Weeks    Status New    Target Date 10/26/21                   Plan - 08/07/21 1601     Clinical Impression Statement Pt tolerated treatment session well with ipmroved pain response in her lower extremeties bilaterally. pt had significant difficulty with Stand and pivot transfer with Min-Mod A to prevent LOB. Pt continues to have improved knee control and stability when ambulating with more supportive AFO and hemi walker compared to AFo privided to her by rep. Pt also reports improved levels of pain in the lower extremitied following walking activities. Pt also demonstrated impaired ability to complee transfer to and from mat table and required min to mod A in order to prevent LOB.    Personal Factors and Comorbidities Age;Comorbidity 3+;Fitness;Past/Current Experience;Time since onset of injury/illness/exacerbation    Comorbidities DM, HTN, PVD, clotting disorder, CAD    Examination-Activity Limitations Squat;Stairs;Bathing;Bed Mobility;Dressing;Reach Overhead;Hygiene/Grooming;Bend;Carry;Locomotion Level;Transfers;Toileting;Stand    Examination-Participation Restrictions Cleaning;Community  Activity;Occupation;Laundry;Driving;Yard Work    [employer] Evolving/Moderate complexity    Clinical Decision Making Moderate    Rehab Potential Good    PT Frequency 2x / week    PT Treatment/Interventions Electrical Stimulation;Gait training;Stair training;Functional mobility training;Therapeutic activities;Therapeutic exercise;Balance training;Neuromuscular re-education;Patient/family education;Manual techniques;Wheelchair mobility training;Energy conservation;Passive range of motion;Orthotic Fit/Training;DME Instruction;ADLs/Self Care Home Management    PT Next Visit Plan work toward supervision level STS and SPT c HW, sustained standing tolerance.    PT Home Exercise Plan none established    Consulted and Agree with Plan of Care Patient;Family member/caregiver    Family Member Consulted husband             Patient will benefit from skilled therapeutic intervention in order to improve the following deficits and impairments:  Decreased endurance, Decreased mobility, Difficulty walking, Hypomobility, Decreased balance, Abnormal gait, Increased muscle spasms, Cardiopulmonary status limiting activity, Impaired flexibility, Hypermobility, Decreased strength, Decreased activity tolerance  Visit Diagnosis: Hemiplegia and hemiparesis following cerebral infarction affecting left non-dominant side (HCC)  Muscle  weakness (generalized)  Difficulty in walking, not elsewhere classified     Problem List Patient Active Problem List   Diagnosis Date Noted   GERD (gastroesophageal reflux disease) 08/03/2021   Chronic post-traumatic stress disorder (PTSD)    Depression with anxiety    Right pontine stroke ([place]) 06/28/2021   Stroke ([place]) 06/24/2021   Chronic diastolic CHF (congestive heart failure) ([place]) 06/24/2021   Fall 06/24/2021   Abnormal LFTs 06/24/2021   Acute respiratory failure with hypoxia ([place]) 06/24/2021   Olecranon bursitis of left elbow 06/12/2021    Chronic hip pain (Bilateral) 06/09/2021   Chronic use of opiate for therapeutic purpose 05/09/2021   Greater trochanteric bursitis of hip (Left) 03/09/2021   Bursitis of hip (Left) Q000111Q   Uncomplicated opioid dependence ([place]) 02/20/2021   Acute conjunctivitis of left eye 01/02/2021   Unintentional weight loss 11/21/2020   Broken teeth (Right) 08/02/2020   History of MI (myocardial infarction) (July 2017) 08/02/2020   Neuropathy 06/09/2020   Dental abscess 06/09/2020   Foot drop (Left) 06/09/2020   Carotid stenosis, asymptomatic, right 04/27/2020   Chronic migraine 11/03/2019   Pyelonephritis due to Escherichia coli 10/26/2019   Thrombocytosis 09/16/2019   Erythrocytosis 09/16/2019   Hypercalcemia 09/06/2019   Leukocytosis 09/06/2019   Polycythemia 09/06/2019   Chronic hip pain (Left) 08/27/2019   Osteoarthritis of hip (Left) 08/27/2019   Gluteal tendonitis of buttock (Left) 08/27/2019   Radial nerve palsy (Right) 07/15/2019   Neuropathy of radial nerve (Right) 06/30/2019   Abnormal bruising 06/25/2019   History of carotid endarterectomy (Right) 03/04/2019   Pain medication agreement signed 03/04/2019   Atypical facial pain (Right) 01/27/2019   Chronic ear pain (Right) 01/27/2019   Chronic jaw pain (Right) 01/27/2019   Geniculate Neuralgia (Right) 01/27/2019   Vitamin D deficiency 01/19/2019   Neurogenic pain 01/19/2019   Chronic anticoagulation (PLAVIX) 01/19/2019   Chronic pain syndrome 01/07/2019   Long term current use of opiate analgesic 01/07/2019   Long term prescription benzodiazepine use 01/07/2019   Pharmacologic therapy 01/07/2019   Disorder of skeletal system 01/07/2019   Problems influencing health status 01/07/2019   Chronic headaches (1ry area of Pain) (Right) 01/07/2019   Opiate use 09/24/2018   PVD (peripheral vascular disease) ([place]) 06/24/2018   Chronic ankle pain (Bilateral) 12/27/2017   Tendinopathy of gluteus medius (Right) 12/27/2017    Tendinopathy of gluteus medius (Left) 12/27/2017   Bilateral hip pain 12/26/2017   Chronic elbow pain (Left) 10/17/2017   Elevated troponin I level 05/21/2017   Severe sepsis ([place]) 05/19/2017   Major depressive disorder, recurrent episode, moderate (HCC) 11/19/2016   Insomnia 10/30/2016   Constipation 07/25/2016   S/P CABG x 4 07/06/2016   Bradycardia    CAD (coronary artery disease)    Carotid stenosis    CAD in native artery 06/29/2016   Elevated troponin 06/28/2016   Essential hypertension, malignant 06/28/2016   Tobacco abuse 06/28/2016   Essential hypertension    Malignant hypertension    Type 2 diabetes mellitus with other specified complication (HCC)    Chest pain with high risk for cardiac etiology 06/27/2016   NSTEMI (non-ST elevated myocardial infarction) ([place]) 06/27/2016   Proteinuria 03/15/2016   Renal artery stenosis ([place]) 03/15/2016   MDD (major depressive disorder) 10/17/2015   Agoraphobia with panic attacks 04/25/2015   HTN (hypertension), malignant 10/20/2013   Cluster headache 03/20/2012   HLD (hyperlipidemia) 07/26/2010   ADJUSTMENT DISORDER WITH MIXED FEATURES 07/26/2010    Particia Lather 08/07/2021, 4:26 PM  [place] MAIN [facility] SERVICES [address] [place], [place], [address] Phone: [phone]   Fax:  [phone]  Name: EMORY PRASHAD MRN: [id number] Date of Birth: 03-14-1971

## 2021-08-08 ENCOUNTER — Encounter: Payer: Self-pay | Admitting: Occupational Therapy

## 2021-08-08 NOTE — Telephone Encounter (Signed)
Pt has been scheduled.  °

## 2021-08-08 NOTE — Therapy (Signed)
[place] MAIN [facility] SERVICES [address] [place], [place], [address] Phone: [phone]   Fax:  [phone]  Speech Language Pathology Treatment  Patient Details  Name: Erika [name] MRN: [id number] Date of Birth: 15-Sep-1971 Referring Provider (SLP): Ranell Patrick Clide Deutscher, MD   Encounter Date: 08/07/2021   End of Session - 08/08/21 0844     Visit Number 2    Number of Visits 20    Date for SLP Re-Evaluation 11/02/21    Authorization Type [employer] - 20 visits per calendar year for OT, PT, and ST combined. counts as 1 visit if same day    Authorization - Visit Number 2    Authorization - Number of Visits 20    Progress Note Due on Visit 10    SLP Start Time 1600    SLP Stop Time  1700    SLP Time Calculation (min) 60 min    Activity Tolerance Patient tolerated treatment well             Past Medical History:  Diagnosis Date   Arthralgia of temporomandibular joint    CAD, multiple vessel    a. 06/2016 Cath: ostLM 40%, ostLAD 40%, pLAD 95%, ost-pLCx 60%, pLCx 95%, mLCx 60%, mRCA 95%, D2 50%, LVSF nl;  b. 07/2016 CABG x 4 (LIMA->LAD, VG->Diag, VG->OM, VG->RCA); c. 08/2016 Cath: 3VD w/ 4/4 patent grafts. LAD distal to LIMA has diff dzs->Med rx; d. 08/2020 Cath: 4/4 patent grafts, native 3VD. EF 55-65%-->Med Rx.   Carotid arterial disease ([place])    a. 07/2016 s/p R CEA; b. 02/2021 U/S: RICA 123456, LICA 123456.   Clotting disorder ([place])    Depression    Diastolic dysfunction    a. 06/2016 Echo: EF 50-55%, mild inf wall HK, GR1DD, mild MR, RV sys fxn nl, mildly dilated LA, PASP nl   Fatty liver disease, nonalcoholic Q000111Q   History of blood transfusion    with heart surgery   HLD (hyperlipidemia)    Labile hypertension    a. prior renal ngiogram negative for RAS in 03/2016; b. catecholamines and metanephrines normal, mildly elevated renin with normal aldosterone and normal ratio in 02/2016   Myocardial infarction [facility]) 2017   Obesity    PAD  (peripheral artery disease) ([place])    a. 09/2018 s/p L SFA stenting; b. 07/2019 Periph Angio: Patent m/d L SFA stent w/ 100% L SFA distal to stent. L AT 100d, L Peroneal diff dzs-->Med Rx; c. 02/2021 ABIs: stable @ 0.61 on R and 0.46 on L.   PTSD (post-traumatic stress disorder)    Tobacco abuse    Type 2 diabetes mellitus ([place]) 12/2015    Past Surgical History:  Procedure Laterality Date   ABDOMINAL AORTOGRAM W/LOWER EXTREMITY [name] 10/15/2018   Procedure: ABDOMINAL AORTOGRAM W/LOWER EXTREMITY;  Surgeon: Wellington Hampshire, MD;  Location: [place] CV LAB;  Service: Cardiovascular;  Laterality: [name];   ABDOMINAL AORTOGRAM W/LOWER EXTREMITY Bilateral 08/19/2019   Procedure: ABDOMINAL AORTOGRAM W/LOWER EXTREMITY;  Surgeon: Wellington Hampshire, MD;  Location: [place] [facility] LAB;  Service: Cardiovascular;  Laterality: Bilateral;   CARDIAC CATHETERIZATION [name] 06/29/2016   Procedure: Left Heart Cath and Coronary Angiography;  Surgeon: Minna Merritts, MD;  Location: [place] [facility] LAB;  Service: Cardiovascular;  Laterality: [name];   CARDIAC CATHETERIZATION [name] 08/29/2016   Procedure: Left Heart Cath and Cors/Grafts Angiography;  Surgeon: Wellington Hampshire, MD;  Location: [place] [facility] LAB;  Service: Cardiovascular;  Laterality: [name];  CESAREAN SECTION     CHOLECYSTECTOMY     CORONARY ARTERY BYPASS GRAFT [name] 07/06/2016   Procedure: CORONARY ARTERY BYPASS GRAFTING (CABG) x four, using left internal mammary artery and right leg greater saphenous vein harvested endoscopically;  Surgeon: Ivin Poot, MD;  Location: [place];  Service: Open Heart Surgery;  Laterality: [name];   ENDARTERECTOMY Right 07/06/2016   Procedure: ENDARTERECTOMY CAROTID;  Surgeon: Rosetta Posner, MD;  Location: [facility] OR;  Service: Vascular;  Laterality: Right;   ENDARTERECTOMY Right 04/27/2020   Procedure: REDO OF RIGHT ENDARTERECTOMY CAROTID;  Surgeon: Rosetta Posner, MD;  Location: [facility] OR;  Service: Vascular;  Laterality: Right;   LEFT HEART CATH  AND CORS/GRAFTS ANGIOGRAPHY [name] 08/24/2020   Procedure: LEFT HEART CATH AND CORS/GRAFTS ANGIOGRAPHY;  Surgeon: Wellington Hampshire, MD;  Location: [place] [facility] LAB;  Service: Cardiovascular;  Laterality: [name];   PERIPHERAL VASCULAR CATHETERIZATION [name] 04/18/2016   Procedure: Renal Angiography;  Surgeon: Wellington Hampshire, MD;  Location: [place] [facility];  Service: Cardiovascular;  Laterality: [name];   PERIPHERAL VASCULAR INTERVENTION Left 10/15/2018   Procedure: PERIPHERAL VASCULAR INTERVENTION;  Surgeon: Wellington Hampshire, MD;  Location: [place] [facility] LAB;  Service: Cardiovascular;  Laterality: Left;  Left superficial femoral   TEE WITHOUT CARDIOVERSION [name] 07/06/2016   Procedure: TRANSESOPHAGEAL ECHOCARDIOGRAM (TEE);  Surgeon: Ivin Poot, MD;  Location: [place];  Service: Open Heart Surgery;  Laterality: [name];   TONSILLECTOMY      There were no vitals filed for this visit.   Subjective Assessment - 08/08/21 0828     Subjective "I need to work my brain."    Patient is accompained by: Family member   husband [name]   Currently in Pain? No/denies                   ADULT SLP TREATMENT - 08/08/21 0829       General Information   Behavior/Cognition Alert;Cooperative    HPI Erika [name] is a 50 y.o. female admitted to [facility] 06/24/21 with slurred speech, left facial droop and LUE weakness; MRI showed acute R pontine CVA and chronic R parietal and occipital infarcts. Past medical history is noted for HTN, HLD, DM II, PAD, MI, CAD, s/p CABG, CHF, PTSD, adjustment disorder, obesity. Patient worked with [place], [place], [place] in [place] 06/28/21-08/01/21. SLP targeted cognitive deficits and dysarthria, as well as dysphagia; upgraded to regular/thin by time of d/c home.      Treatment Provided   Treatment provided Cognitive-Linquistic      Pain Assessment   Pain Assessment No/denies pain      Cognitive-Linquistic Treatment   Treatment focused on Cognition;Patient/family/caregiver education    Skilled  Treatment Education provided on memory strategies [facility], see pt instructions), as well as use of calendar system to aid functional recall/writing. Pt required cues to check phone to correct date error; subsequently filled in weekly calendar information with occasional mod cues. Appeared to have difficulty processing written information at times (appointments in MyChart), as well as difficulty spelling. Cued pt to use strategy of verbalizing/spelling out loud prior to writing the word. Pt is attending board meeting later this week; educated on note-taking to aid for attention/recall. Reviewed how pt can use memory strategies for household activities such as grocery shopping (organize the grocery list, try to remember 5 items from the list using memory strategies, etc).      Assessment / Recommendations / Plan   Plan Continue with current plan of care  Progression Toward Goals   Progression toward goals Progressing toward goals              SLP Education - 08/08/21 0843     Education Details see pt instructions    Person(s) Educated Patient;Spouse    Methods Explanation;Handout    Comprehension Verbalized understanding;Need further instruction              SLP Short Term Goals - 08/04/21 1246       SLP SHORT TERM GOAL #1   Title Pt will verbalize awareness of impulsivity in simple tasks >80% of the time with min cues.    Time 10    Period --   sessions   Status New      SLP SHORT TERM GOAL #2   Title Pt will ID out-of-turn responses >80% with non-verbal cues.    Time 10    Period --   sessions   Status New      SLP SHORT TERM GOAL #3   Title Pt will verbalize steps to safety for transfers/gait training to reduce impulsive/unsafe movement.    Time 10    Period --   sessions   Status New      SLP SHORT TERM GOAL #4   Title Pt will verbalize daily schedule, recent events and salient information using external memory aids.    Time 10    Period --   sessions   Status  New      SLP SHORT TERM GOAL #5   Title Generate written or verbal cues to improve accuracy of 3 house hold tasks or safety.    Time 10    Period --   sessions   Status New              SLP Long Term Goals - 08/04/21 1334       SLP LONG TERM GOAL #1   Title Pt will verbalize or write steps prior to task initiation >80% with modified independence.    Time 12    Period Weeks    Status New    Target Date 11/01/21      SLP LONG TERM GOAL #2   Title Pt will demonstrate appropriate turn-taking >90% of the time in 15 minutes mod complex conversation.    Time 12    Period Weeks    Status New    Target Date 11/01/21      SLP LONG TERM GOAL #3   Title Pt will use external aids to organize/recall appointments and medical information for herself and her daughter.    Time 12    Period Weeks    Status New    Target Date 11/01/21      SLP LONG TERM GOAL #4   Title Pt will plan, organize and present a 20-30 minute lesson on personal or gun safety using external aids/compensations    Time 12    Period Weeks    Status New    Target Date 11/01/21      SLP LONG TERM GOAL #5   Title Patient will use compensations for dysarthria in 20 minute mod complex conversation outside of [place] room for >95% intelligibility.    Time 12    Period Weeks    Status New    Target Date 11/01/21              Plan - 08/08/21 0844     Clinical Impression Statement Neelie Men presents with overall mild-moderate cognitive  communication deficits with impairments in attention, memory, problem solving, language, and executive function. She also has mild dysarthria characterized by imprecise articulation impacting intelligibility at conversation level. Pragmatics in conversation are impaired; pt is highly impulsive and repeatedly cuts off speaker in conversation by responding before utterance is completed. She was responsive to error confrontation and training in compensation strategies today. 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 Recommend skilled ST to maximize cognitive and communication abilities to increase safety, independence, and life participation.    Speech Therapy Frequency 2x / week    Duration 12 weeks   20 visits allowed per calendar year per ins   Treatment/Interventions Language facilitation;Environmental controls;Cueing hierarchy;SLP instruction and feedback;Compensatory techniques;Cognitive reorganization;Functional tasks;Compensatory strategies;Internal/external aids;Multimodal communcation approach;Patient/family education    Potential to Achieve Goals Good    SLP Home Exercise Plan notebook/binder for therapies    Consulted and Agree with Plan of Care Patient;Family member/caregiver             Patient will benefit from skilled therapeutic intervention in order to improve the following deficits and impairments:   Cognitive communication deficit  Dysarthria and anarthria  Right pontine stroke [facility])    Problem List Patient Active Problem List   Diagnosis Date Noted   GERD (gastroesophageal reflux disease) 08/03/2021   Chronic post-traumatic stress disorder (PTSD)    Depression with anxiety    Right pontine stroke ([place]) 06/28/2021   Stroke ([place]) 06/24/2021   Chronic diastolic CHF (congestive heart failure) ([place]) 06/24/2021   Fall 06/24/2021   Abnormal LFTs 06/24/2021   Acute respiratory failure with hypoxia ([place]) 06/24/2021   Olecranon bursitis of left elbow 06/12/2021   Chronic hip pain (Bilateral) 06/09/2021   Chronic use of opiate for therapeutic purpose 05/09/2021   Greater trochanteric bursitis of hip (Left) 03/09/2021   Bursitis of hip (Left) Q000111Q   Uncomplicated opioid dependence  ([place]) 02/20/2021   Acute conjunctivitis of left eye 01/02/2021   Unintentional weight loss 11/21/2020   Broken teeth (Right) 08/02/2020   History of MI (myocardial infarction) (July 2017) 08/02/2020   Neuropathy 06/09/2020   Dental abscess 06/09/2020   Foot drop (Left) 06/09/2020   Carotid stenosis, asymptomatic, right 04/27/2020   Chronic migraine 11/03/2019   Pyelonephritis due to Escherichia coli 10/26/2019   Thrombocytosis 09/16/2019   Erythrocytosis 09/16/2019   Hypercalcemia 09/06/2019   Leukocytosis 09/06/2019   Polycythemia 09/06/2019   Chronic hip pain (Left) 08/27/2019   Osteoarthritis of hip (Left) 08/27/2019   Gluteal tendonitis of buttock (Left) 08/27/2019   Radial nerve palsy (Right) 07/15/2019   Neuropathy of radial nerve (Right) 06/30/2019   Abnormal bruising 06/25/2019   History of carotid endarterectomy (Right) 03/04/2019   Pain medication agreement signed 03/04/2019   Atypical facial pain (Right) 01/27/2019   Chronic ear pain (Right) 01/27/2019   Chronic jaw pain (Right) 01/27/2019   Geniculate Neuralgia (Right) 01/27/2019   Vitamin D deficiency 01/19/2019   Neurogenic pain 01/19/2019   Chronic anticoagulation (PLAVIX) 01/19/2019   Chronic pain syndrome 01/07/2019   Long term current use of opiate analgesic 01/07/2019   Long term prescription benzodiazepine use 01/07/2019   Pharmacologic therapy 01/07/2019   Disorder of skeletal system 01/07/2019   Problems influencing health status 01/07/2019   Chronic headaches (1ry area of  Pain) (Right) 01/07/2019   Opiate use 09/24/2018   PVD (peripheral vascular disease) ([place]) 06/24/2018   Chronic ankle pain (Bilateral) 12/27/2017   Tendinopathy of gluteus medius (Right) 12/27/2017   Tendinopathy of gluteus medius (Left) 12/27/2017   Bilateral hip pain 12/26/2017   Chronic elbow pain (Left) 10/17/2017   Elevated troponin I level 05/21/2017   Severe sepsis ([place]) 05/19/2017   Major depressive disorder, recurrent  episode, moderate ([place]) 11/19/2016   Insomnia 10/30/2016   Constipation 07/25/2016   S/P CABG x 4 07/06/2016   Bradycardia    CAD (coronary artery disease)    Carotid stenosis    CAD in native artery 06/29/2016   Elevated troponin 06/28/2016   Essential hypertension, malignant 06/28/2016   Tobacco abuse 06/28/2016   Essential hypertension    Malignant hypertension    Type 2 diabetes mellitus with other specified complication ([place])    Chest pain with high risk for cardiac etiology 06/27/2016   NSTEMI (non-ST elevated myocardial infarction) ([place]) 06/27/2016   Proteinuria 03/15/2016   Renal artery stenosis ([place]) 03/15/2016   MDD (major depressive disorder) 10/17/2015   Agoraphobia with panic attacks 04/25/2015   HTN (hypertension), malignant 10/20/2013   Cluster headache 03/20/2012   HLD (hyperlipidemia) 07/26/2010   ADJUSTMENT DISORDER WITH MIXED FEATURES 07/26/2010   Deneise Lever, [place], [place] Speech-Language Pathologist   Aliene Altes 08/08/2021, 8:46 AM  [place] [address] [place], [place], [address] Phone: [phone]   Fax:  [phone]   Name: KADAISHA MCNUTT MRN: [id number] Date of Birth: 1971-12-07

## 2021-08-08 NOTE — Therapy (Signed)
[place] MAIN [facility] SERVICES [address] [place], [place], [address] Phone: [phone]   Fax:  [phone]  Occupational Therapy Treatment  Patient Details  Name: Erika [name] MRN: [id number] Date of Birth: 08-Aug-1971 Referring Provider (OT): Dr. Leeroy Cha   Encounter Date: 08/07/2021   OT End of Session - 08/08/21 1002     Visit Number 2    Number of Visits 20    Date for OT Re-Evaluation 10/25/21    Authorization Type limited to 20 visits combined/multidiscipline    OT Start Time 1520    OT Stop Time 1600    OT Time Calculation (min) 40 min    Equipment Utilized During Treatment wc    Activity Tolerance Patient tolerated treatment well    Behavior During Therapy [facility] for tasks assessed/performed;Flat affect;Impulsive             Past Medical History:  Diagnosis Date   Arthralgia of temporomandibular joint    CAD, multiple vessel    a. 06/2016 Cath: ostLM 40%, ostLAD 40%, pLAD 95%, ost-pLCx 60%, pLCx 95%, mLCx 60%, mRCA 95%, D2 50%, LVSF nl;  b. 07/2016 CABG x 4 (LIMA->LAD, VG->Diag, VG->OM, VG->RCA); c. 08/2016 Cath: 3VD w/ 4/4 patent grafts. LAD distal to LIMA has diff dzs->Med rx; d. 08/2020 Cath: 4/4 patent grafts, native 3VD. EF 55-65%-->Med Rx.   Carotid arterial disease ([place])    a. 07/2016 s/p R CEA; b. 02/2021 U/S: RICA 123456, LICA 123456.   Clotting disorder ([place])    Depression    Diastolic dysfunction    a. 06/2016 Echo: EF 50-55%, mild inf wall HK, GR1DD, mild MR, RV sys fxn nl, mildly dilated LA, PASP nl   Fatty liver disease, nonalcoholic Q000111Q   History of blood transfusion    with heart surgery   HLD (hyperlipidemia)    Labile hypertension    a. prior renal ngiogram negative for RAS in 03/2016; b. catecholamines and metanephrines normal, mildly elevated renin with normal aldosterone and normal ratio in 02/2016   Myocardial infarction [facility]) 2017   Obesity    PAD (peripheral artery disease) ([place])    a.  09/2018 s/p L SFA stenting; b. 07/2019 Periph Angio: Patent m/d L SFA stent w/ 100% L SFA distal to stent. L AT 100d, L Peroneal diff dzs-->Med Rx; c. 02/2021 ABIs: stable @ 0.61 on R and 0.46 on L.   PTSD (post-traumatic stress disorder)    Tobacco abuse    Type 2 diabetes mellitus ([place]) 12/2015    Past Surgical History:  Procedure Laterality Date   ABDOMINAL AORTOGRAM W/LOWER EXTREMITY [name] 10/15/2018   Procedure: ABDOMINAL AORTOGRAM W/LOWER EXTREMITY;  Surgeon: Wellington Hampshire, MD;  Location: [place] CV LAB;  Service: Cardiovascular;  Laterality: [name];   ABDOMINAL AORTOGRAM W/LOWER EXTREMITY Bilateral 08/19/2019   Procedure: ABDOMINAL AORTOGRAM W/LOWER EXTREMITY;  Surgeon: Wellington Hampshire, MD;  Location: [place] CV LAB;  Service: Cardiovascular;  Laterality: Bilateral;   CARDIAC CATHETERIZATION [name] 06/29/2016   Procedure: Left Heart Cath and Coronary Angiography;  Surgeon: Minna Merritts, MD;  Location: [place] CV LAB;  Service: Cardiovascular;  Laterality: [name];   CARDIAC CATHETERIZATION [name] 08/29/2016   Procedure: Left Heart Cath and Cors/Grafts Angiography;  Surgeon: Wellington Hampshire, MD;  Location: [place] CV LAB;  Service: Cardiovascular;  Laterality: [name];   CESAREAN SECTION     CHOLECYSTECTOMY     CORONARY ARTERY BYPASS GRAFT [name] 07/06/2016   Procedure: CORONARY ARTERY BYPASS  GRAFTING (CABG) x four, using left internal mammary artery and right leg greater saphenous vein harvested endoscopically;  Surgeon: Ivin Poot, MD;  Location: [place];  Service: Open Heart Surgery;  Laterality: [name];   ENDARTERECTOMY Right 07/06/2016   Procedure: ENDARTERECTOMY CAROTID;  Surgeon: Rosetta Posner, MD;  Location: [facility] OR;  Service: Vascular;  Laterality: Right;   ENDARTERECTOMY Right 04/27/2020   Procedure: REDO OF RIGHT ENDARTERECTOMY CAROTID;  Surgeon: Rosetta Posner, MD;  Location: [facility] OR;  Service: Vascular;  Laterality: Right;   LEFT HEART CATH AND CORS/GRAFTS ANGIOGRAPHY [name] 08/24/2020    Procedure: LEFT HEART CATH AND CORS/GRAFTS ANGIOGRAPHY;  Surgeon: Wellington Hampshire, MD;  Location: [place] CV LAB;  Service: Cardiovascular;  Laterality: [name];   PERIPHERAL VASCULAR CATHETERIZATION [name] 04/18/2016   Procedure: Renal Angiography;  Surgeon: Wellington Hampshire, MD;  Location: [place] CV LAB;  Service: Cardiovascular;  Laterality: [name];   PERIPHERAL VASCULAR INTERVENTION Left 10/15/2018   Procedure: PERIPHERAL VASCULAR INTERVENTION;  Surgeon: Wellington Hampshire, MD;  Location: [place] CV LAB;  Service: Cardiovascular;  Laterality: Left;  Left superficial femoral   TEE WITHOUT CARDIOVERSION [name] 07/06/2016   Procedure: TRANSESOPHAGEAL ECHOCARDIOGRAM (TEE);  Surgeon: Ivin Poot, MD;  Location: [place];  Service: Open Heart Surgery;  Laterality: [name];   TONSILLECTOMY      There were no vitals filed for this visit.   Subjective Assessment - 08/08/21 1001     Subjective  Pt. reports having 7/10 cluster headache pain    Patient is accompanied by: Family member    Pertinent History HX of CAD, CABG, DM2, s/p R pontine stroke    Patient Stated Goals "I want to be able to take care of myself."    Currently in Pain? Yes    Pain Score 7     Pain Location Head    Pain Descriptors / Indicators Aching    Pain Onset More than a month ago            OT TREATMENT    Therapeutic Exercise:  Pt. tolerated PROM in all joint ranges for left shoulder flexion, extension, abduction, horizontal abduction, and adduction, elbow flexion, extension, forearm supination, pronation, wrist extension, digit MP, PIP, and DIP flexion, and extension. Pt./caregiver education was provided about self-PROM program for the left UE.  Pt. was provided with a visual handout through [place].  Manual Therapy:  Pt. tolerated scapular mobilizations for elevation, depression, abduction/rotation.  Pt. tolerated retrograde massage, and soft tissue mobilizations for metacarpal spread stretches to the left hand  secondary to increased edema in the left hand. Manual therapy was performed independent of, and in preparation for ther. Ex.  Pt. reports 7/10 cluster headaches. Pt. Required minA to transfer from the w/c to the mat with step by step cues, and increased time to advance LEs,  and hand placement. Cues were required for body position in space. Pt. tolerated ROM, and manual therapy well with decreased edema noted in the left hand following intervention.  Pt. was able to demonstrate self-ROM to the left hand using the right hand with visual, verbal, and tactile cues. Pt. Education was provided about finding opportunities to engage the LUE as a gross assist during daily activities. Pt. continues to work on improving and maximizing independence with ADLs, and IADL tasks.                      OT Education - 08/08/21 1002  Education Details PROM to LUE    Person(s) Educated Patient;Spouse    Methods Explanation;Demonstration;Verbal cues    Comprehension Verbalized understanding;Returned demonstration;Need further instruction;Verbal cues required              OT Short Term Goals - 08/04/21 0914       OT SHORT TERM GOAL #1   Title Pt will be perform HEP for LUE with min vc.    Baseline Eval: Initiated at eval, further instruction needed    Time 6    Period Weeks    Status New    Target Date 09/13/21               OT Long Term Goals - 08/04/21 0915       OT LONG TERM GOAL #1   Title Pt will increase FOTO score to 40 or better to indicate increased functional performance    Baseline Eval: 23    Time 12    Period Weeks    Status New    Target Date 10/25/21      OT LONG TERM GOAL #2   Title Pt will perform UB ADLs with set up    Baseline Eval: Mod A to perform UB ADLs    Time 12    Period Weeks    Status New    Target Date 10/25/21      OT LONG TERM GOAL #3   Title Pt will perform LB ADLs with min A    Baseline Eval: Max A to perform LB ADLs    Time 12     Period Weeks    Status New    Target Date 10/25/21      OT LONG TERM GOAL #4   Title Pt will perform ADL transfers with distant supv    Baseline Eval: min A-mod A to perform ADL transfers    Time 12    Period Weeks    Status New    Target Date 10/25/21      OT LONG TERM GOAL #5   Title Pt will increase LUE strength by 2 MM grades to work towards use of LUE as a stabilizer for self care.    Baseline Eval: L shoulder 1/5, elbow/wrist/hand 0/5.  Unable to use LUE as a stabilzer for self care.    Time 12    Period Weeks    Status New    Target Date 10/25/21                   Plan - 08/08/21 1003     Clinical Impression Statement Pt. reports 7/10 cluster headaches. Pt. Required minA to transfer from the w/c to the mat with step by step cues, and increased time to advance LEs,  and hand placement. Cues were required for body position in space. Pt. tolerated ROM, and manual therapy well with decreased edema noted in the left hand following intervention.  Pt. was able to demonstrate self-ROM to the left hand using the right hand with visual, verbal, and tactile cues. Pt. Education was provided about finding opportunities to engage the LUE as a gross assist during daily activities. Pt. continues to work on improving and maximizing independence with ADLs, and IADL tasks.   OT Occupational Profile and History Detailed Assessment- Review of Records and additional review of physical, cognitive, psychosocial history related to current functional performance    Occupational performance deficits (Please refer to evaluation for details): ADL's;IADL's;Leisure;Work    Marketing executive / Function /  Physical Skills ADL;Continence;Dexterity;Strength;Vision;Balance;Coordination;FMC;IADL;Body mechanics;Endurance;Gait;UE functional use;Decreased knowledge of use of DME;GMC;Mobility    Rehab Potential Good    Clinical Decision Making Several treatment options, min-mod task modification necessary     Comorbidities Affecting Occupational Performance: May have comorbidities impacting occupational performance    Modification or Assistance to Complete Evaluation  Min-Moderate modification of tasks or assist with assess necessary to complete eval    OT Frequency 2x / week    OT Duration 12 weeks    OT Treatment/Interventions Self-care/ADL training;Therapeutic exercise;DME and/or AE instruction;Functional Mobility Training;Balance training;Electrical Stimulation;Neuromuscular education;Manual Therapy;Splinting;Visual/perceptual remediation/compensation;Moist Heat;Passive range of motion;Therapeutic activities;Patient/family education    Consulted and Agree with Plan of Care Patient;Family member/caregiver             Patient will benefit from skilled therapeutic intervention in order to improve the following deficits and impairments:   Body Structure / Function / Physical Skills: ADL, Continence, Dexterity, Strength, Vision, Balance, Coordination, [place], IADL, Body mechanics, Endurance, Gait, UE functional use, Decreased knowledge of use of DME, [place], Mobility       Visit Diagnosis: Muscle weakness (generalized)  Other lack of coordination    Problem List Patient Active Problem List   Diagnosis Date Noted   GERD (gastroesophageal reflux disease) 08/03/2021   Chronic post-traumatic stress disorder (PTSD)    Depression with anxiety    Right pontine stroke ([place]) 06/28/2021   Stroke ([place]) 06/24/2021   Chronic diastolic CHF (congestive heart failure) ([place]) 06/24/2021   Fall 06/24/2021   Abnormal LFTs 06/24/2021   Acute respiratory failure with hypoxia ([place]) 06/24/2021   Olecranon bursitis of left elbow 06/12/2021   Chronic hip pain (Bilateral) 06/09/2021   Chronic use of opiate for therapeutic purpose 05/09/2021   Greater trochanteric bursitis of hip (Left) 03/09/2021   Bursitis of hip (Left) [id number]   Uncomplicated opioid dependence ([place]) 02/20/2021   Acute conjunctivitis of  left eye 01/02/2021   Unintentional weight loss 11/21/2020   Broken teeth (Right) 08/02/2020   History of MI (myocardial infarction) (July 2017) 08/02/2020   Neuropathy 06/09/2020   Dental abscess 06/09/2020   Foot drop (Left) 06/09/2020   Carotid stenosis, asymptomatic, right 04/27/2020   Chronic migraine 11/03/2019   Pyelonephritis due to Escherichia coli 10/26/2019   Thrombocytosis 09/16/2019   Erythrocytosis 09/16/2019   Hypercalcemia 09/06/2019   Leukocytosis 09/06/2019   Polycythemia 09/06/2019   Chronic hip pain (Left) 08/27/2019   Osteoarthritis of hip (Left) 08/27/2019   Gluteal tendonitis of buttock (Left) 08/27/2019   Radial nerve palsy (Right) 07/15/2019   Neuropathy of radial nerve (Right) 06/30/2019   Abnormal bruising 06/25/2019   History of carotid endarterectomy (Right) 03/04/2019   Pain medication agreement signed 03/04/2019   Atypical facial pain (Right) 01/27/2019   Chronic ear pain (Right) 01/27/2019   Chronic jaw pain (Right) 01/27/2019   Geniculate Neuralgia (Right) 01/27/2019   Vitamin D deficiency 01/19/2019   Neurogenic pain 01/19/2019   Chronic anticoagulation (PLAVIX) 01/19/2019   Chronic pain syndrome 01/07/2019   Long term current use of opiate analgesic 01/07/2019   Long term prescription benzodiazepine use 01/07/2019   Pharmacologic therapy 01/07/2019   Disorder of skeletal system 01/07/2019   Problems influencing health status 01/07/2019   Chronic headaches (1ry area of Pain) (Right) 01/07/2019   Opiate use 09/24/2018   PVD (peripheral vascular disease) ([place]) 06/24/2018   Chronic ankle pain (Bilateral) 12/27/2017   Tendinopathy of gluteus medius (Right) 12/27/2017   Tendinopathy of gluteus medius (Left) 12/27/2017   Bilateral hip pain 12/26/2017  Chronic elbow pain (Left) 10/17/2017   Elevated troponin I level 05/21/2017   Severe sepsis ([place]) 05/19/2017   Major depressive disorder, recurrent episode, moderate ([place]) 11/19/2016   Insomnia  10/30/2016   Constipation 07/25/2016   S/P CABG x 4 07/06/2016   Bradycardia    CAD (coronary artery disease)    Carotid stenosis    CAD in native artery 06/29/2016   Elevated troponin 06/28/2016   Essential hypertension, malignant 06/28/2016   Tobacco abuse 06/28/2016   Essential hypertension    Malignant hypertension    Type 2 diabetes mellitus with other specified complication ([place])    Chest pain with high risk for cardiac etiology 06/27/2016   NSTEMI (non-ST elevated myocardial infarction) ([place]) 06/27/2016   Proteinuria 03/15/2016   Renal artery stenosis ([place]) 03/15/2016   MDD (major depressive disorder) 10/17/2015   Agoraphobia with panic attacks 04/25/2015   HTN (hypertension), malignant 10/20/2013   Cluster headache 03/20/2012   HLD (hyperlipidemia) 07/26/2010   ADJUSTMENT DISORDER WITH MIXED FEATURES 07/26/2010    Harrel Carina, MS, OTR/L 08/08/2021, 10:09 AM  [place] [address] [place], [place], [address] Phone: [phone]   Fax:  [phone]  Name: Erika [name] MRN: [id number] Date of Birth: 11/06/1971

## 2021-08-09 ENCOUNTER — Encounter: Payer: Self-pay | Admitting: Physical Medicine and Rehabilitation

## 2021-08-09 ENCOUNTER — Other Ambulatory Visit: Payer: Self-pay

## 2021-08-09 ENCOUNTER — Encounter
Payer: Managed Care, Other (non HMO) | Attending: Physical Medicine and Rehabilitation | Admitting: Physical Medicine and Rehabilitation

## 2021-08-09 VITALS — BP 167/101 | HR 84 | Temp 98.2°F | Ht 66.0 in | Wt 212.2 lb

## 2021-08-09 DIAGNOSIS — Z79891 Long term (current) use of opiate analgesic: Secondary | ICD-10-CM | POA: Diagnosis present

## 2021-08-09 DIAGNOSIS — M25512 Pain in left shoulder: Secondary | ICD-10-CM | POA: Insufficient documentation

## 2021-08-09 DIAGNOSIS — Z5181 Encounter for therapeutic drug level monitoring: Secondary | ICD-10-CM | POA: Diagnosis present

## 2021-08-09 DIAGNOSIS — Z79899 Other long term (current) drug therapy: Secondary | ICD-10-CM | POA: Diagnosis present

## 2021-08-09 DIAGNOSIS — G894 Chronic pain syndrome: Secondary | ICD-10-CM

## 2021-08-09 DIAGNOSIS — M6281 Muscle weakness (generalized): Secondary | ICD-10-CM | POA: Diagnosis present

## 2021-08-09 DIAGNOSIS — G43909 Migraine, unspecified, not intractable, without status migrainosus: Secondary | ICD-10-CM | POA: Diagnosis present

## 2021-08-09 DIAGNOSIS — I639 Cerebral infarction, unspecified: Secondary | ICD-10-CM | POA: Diagnosis not present

## 2021-08-09 MED ORDER — DEXCOM G6 SENSOR MISC: 1.0000 | Freq: Every day | 3 refills | Status: DC

## 2021-08-09 MED ORDER — DEXCOM G6 RECEIVER DEVI: 1.0000 | Freq: Once | 0 refills | Status: AC

## 2021-08-09 MED ORDER — DEPEND ADJUSTABLE UNDERWEAR LG MISC: 1.0000 [IU] | Freq: Every day | 3 refills | Status: DC

## 2021-08-09 MED ORDER — DEXCOM G6 TRANSMITTER MISC: 1.0000 [IU] | Freq: Every day | 3 refills | Status: DC

## 2021-08-09 NOTE — Progress Notes (Addendum)
Subjective:    Patient ID: Erika [name], female    DOB: 1971/03/02, 50 y.o.   MRN: [id number]  HPI Erika [name] is a 50 year old woman who presents for hospital follow-up after CVA.  -She has been doing well at home, but at times feels she is a burden to her husband and family despite his reassurance that she is not -needs scripts for [facility] receiver, sensory, and transmitter- her daughter has this so she is familiar with its use -Her CBGs have recently been between 120 and 130 -migraines continue to be severe, present every day, despite use of oxycodone and Lyrica. She would like to try Botox -having about one episode of urinary incontinence daily, usually at night   Pain Inventory Average Pain 7 Pain Right Now 7 My pain is constant and dull  LOCATION OF PAIN  head  BOWEL Number of stools per week: 3 Oral laxative use Yes  Type of laxative miralax   BLADDER  Bladder incontinence Yes    Mobility ability to climb steps?  no do you drive?  no use a wheelchair needs help with transfers Do you have any goals in this area?  yes  Function disabled: date disabled . I need assistance with the following:  dressing, bathing, toileting, meal prep, household duties, and shopping  Neuro/Psych bladder control problems bowel control problems trouble walking spasms confusion depression anxiety  Prior Studies Any changes since last visit?  no  Physicians involved in your care Any changes since last visit?  no Primary care sees primary tomorrow Neurologist has not heard from [place] yet   Family History  Adopted: Yes  Problem Relation Age of Onset   Diabetes Mother    Diabetes Father    Alcohol abuse Father    Heart disease Father    Drug abuse Father    Stroke Sister    Anxiety disorder Sister    Social History   Socioeconomic History   Marital status: Married    Spouse name: Not on file   Number of children: 1   Years of education: 14   Highest  education level: Not on file  Occupational History   Occupation: Disability  Tobacco Use   Smoking status: Every Day    Packs/day: 0.50    Years: 27.00    Pack years: 13.50    Types: Cigarettes   Smokeless tobacco: Never  Vaping Use   Vaping Use: Former  Substance and Sexual Activity   Alcohol use: Not Currently    Alcohol/week: 0.0 standard drinks    Comment: socially   Drug use: No   Sexual activity: Yes    Partners: Male    Birth control/protection: None  Other Topics Concern   Not on file  Social History Narrative   11/21/20   From: [name]   Living: with husband, [name] (2009) and special needs daughter    Work: disability       Family: daughter - [name] (special needs, lives with her) and 2 grown step children - [name] and [name]       Enjoys: play with dogs - 2 german Shepard, mut, and blue tick walker mix      Exercise: PAD limits exercise   Diet: diabetic diet and low potassium due to daughter      Safety   Seat belts: Yes    Guns: Yes  and secure   Safe in relationships: Yes    Social Determinants of Engineer, drilling  Resource Strain: Not on file  Food Insecurity: Not on file  Transportation Needs: Not on file  Physical Activity: Not on file  Stress: Not on file  Social Connections: Not on file   Past Surgical History:  Procedure Laterality Date   ABDOMINAL AORTOGRAM W/LOWER EXTREMITY [name] 10/15/2018   Procedure: ABDOMINAL AORTOGRAM W/LOWER EXTREMITY;  Surgeon: Wellington Hampshire, MD;  Location: [place] [facility];  Service: Cardiovascular;  Laterality: [name];   ABDOMINAL AORTOGRAM W/LOWER EXTREMITY Bilateral 08/19/2019   Procedure: ABDOMINAL AORTOGRAM W/LOWER EXTREMITY;  Surgeon: Wellington Hampshire, MD;  Location: [place] [facility];  Service: Cardiovascular;  Laterality: Bilateral;   CARDIAC CATHETERIZATION [name] 06/29/2016   Procedure: Left Heart Cath and Coronary Angiography;  Surgeon: Minna Merritts, MD;  Location: [place] [facility];   Service: Cardiovascular;  Laterality: [name];   CARDIAC CATHETERIZATION [name] 08/29/2016   Procedure: Left Heart Cath and Cors/Grafts Angiography;  Surgeon: Wellington Hampshire, MD;  Location: [place] [facility];  Service: Cardiovascular;  Laterality: [name];   CESAREAN SECTION     CHOLECYSTECTOMY     CORONARY ARTERY BYPASS GRAFT [name] 07/06/2016   Procedure: CORONARY ARTERY BYPASS GRAFTING (CABG) x four, using left internal mammary artery and right leg greater saphenous vein harvested endoscopically;  Surgeon: Ivin Poot, MD;  Location: [place];  Service: Open Heart Surgery;  Laterality: [name];   ENDARTERECTOMY Right 07/06/2016   Procedure: ENDARTERECTOMY CAROTID;  Surgeon: Rosetta Posner, MD;  Location: [facility];  Service: Vascular;  Laterality: Right;   ENDARTERECTOMY Right 04/27/2020   Procedure: REDO OF RIGHT ENDARTERECTOMY CAROTID;  Surgeon: Rosetta Posner, MD;  Location: [facility];  Service: Vascular;  Laterality: Right;   LEFT HEART CATH AND CORS/GRAFTS ANGIOGRAPHY [name] 08/24/2020   Procedure: LEFT HEART CATH AND CORS/GRAFTS ANGIOGRAPHY;  Surgeon: Wellington Hampshire, MD;  Location: [place] [facility];  Service: Cardiovascular;  Laterality: [name];   PERIPHERAL VASCULAR CATHETERIZATION [name] 04/18/2016   Procedure: Renal Angiography;  Surgeon: Wellington Hampshire, MD;  Location: [place] [facility];  Service: Cardiovascular;  Laterality: [name];   PERIPHERAL VASCULAR INTERVENTION Left 10/15/2018   Procedure: PERIPHERAL VASCULAR INTERVENTION;  Surgeon: Wellington Hampshire, MD;  Location: [place] [facility];  Service: Cardiovascular;  Laterality: Left;  Left superficial femoral   TEE WITHOUT CARDIOVERSION [name] 07/06/2016   Procedure: TRANSESOPHAGEAL ECHOCARDIOGRAM (TEE);  Surgeon: Ivin Poot, MD;  Location: [place];  Service: Open Heart Surgery;  Laterality: [name];   TONSILLECTOMY     Past Medical History:  Diagnosis Date   Arthralgia of temporomandibular joint    CAD, multiple vessel    a. 06/2016 Cath: ostLM 40%, ostLAD 40%, pLAD  95%, ost-pLCx 60%, pLCx 95%, mLCx 60%, mRCA 95%, D2 50%, LVSF nl;  b. 07/2016 CABG x 4 (LIMA->LAD, VG->Diag, VG->OM, VG->RCA); c. 08/2016 Cath: 3VD w/ 4/4 patent grafts. LAD distal to LIMA has diff dzs->Med rx; d. 08/2020 Cath: 4/4 patent grafts, native 3VD. EF 55-65%-->Med Rx.   Carotid arterial disease ([place])    a. 07/2016 s/p R CEA; b. 02/2021 U/S: RICA 123456, LICA 123456.   Clotting disorder ([place])    Depression    Diastolic dysfunction    a. 06/2016 Echo: EF 50-55%, mild inf wall HK, GR1DD, mild MR, RV sys fxn nl, mildly dilated LA, PASP nl   Fatty liver disease, nonalcoholic [id number]   History of blood transfusion    with heart surgery   HLD (hyperlipidemia)    Labile hypertension  a. prior renal ngiogram negative for RAS in 03/2016; b. catecholamines and metanephrines normal, mildly elevated renin with normal aldosterone and normal ratio in 02/2016   Myocardial infarction [facility]) 2017   Obesity    PAD (peripheral artery disease) ([place])    a. 09/2018 s/p L SFA stenting; b. 07/2019 Periph Angio: Patent m/d L SFA stent w/ 100% L SFA distal to stent. L AT 100d, L Peroneal diff dzs-->Med Rx; c. 02/2021 ABIs: stable @ 0.61 on R and 0.46 on L.   PTSD (post-traumatic stress disorder)    Tobacco abuse    Type 2 diabetes mellitus ([place]) 12/2015   BP (!) 167/101   Pulse 84   Temp 98.2 F (36.8 C)   Ht '5\' 6"'$  (1.676 m)   Wt 212 lb 3.2 oz (96.3 kg)   SpO2 92%   BMI 34.25 kg/m   Opioid Risk Score:   Fall Risk Score:  `1  Depression screen PHQ 2/9  Depression screen [facility] 2/9 08/09/2021 05/10/2021 02/23/2021 02/20/2021 11/23/2020 11/21/2020 08/02/2020  Decreased Interest 2 0 0 0 0 2 0  Down, Depressed, Hopeless 3 0 0 0 0 3 0  PHQ - 2 Score 5 0 0 0 0 5 0  Altered sleeping 0 - - - - 3 -  Tired, decreased energy 3 - - - - 2 -  Change in appetite 2 - - - - 2 -  Feeling bad or failure about yourself  3 - - - - 2 -  Trouble concentrating 3 - - - - 2 -  Moving slowly or fidgety/restless 2 - - - - 1 -  Suicidal  thoughts 0 - - - - 1 -  PHQ-9 Score 18 - - - - 18 -  Difficult doing work/chores Very difficult - - - - Very difficult -  Some recent data might be hidden     Review of Systems  Constitutional: Negative.   HENT: Negative.    Eyes: Negative.   Respiratory: Negative.    Cardiovascular: Negative.   Gastrointestinal:        Bowel control  Endocrine: Negative.   Genitourinary:        Bladder control  Musculoskeletal:  Positive for gait problem.  Skin: Negative.   Allergic/Immunologic: Negative.   Neurological:  Positive for weakness.  Hematological:  Bruises/bleeds easily.       Plavix  Psychiatric/Behavioral:  Positive for confusion and dysphoric mood. The patient is nervous/anxious.   All other systems reviewed and are negative.     Objective:   Physical Exam  Gen: no distress, normal appearing HEENT: oral mucosa pink and moist, NCAT Cardio: Reg rate Chest: normal effort, normal rate of breathing Abd: soft, non-distended Ext: no edema Psych: pleasant, normal affect Skin: intact Neuro: Alert and oriented Musculoskeletal: Left upper extremity flaccid except for 3/5 shoulder elevation, improving strength in LLE      Assessment & Plan:  Erika [name] is a very pleasant 50 year old woman who presents for follow-up after CVA  1) CVA: Encouraged increasing walking distance every day -referred to neurology for follow-up -continue home therapies  2) Migraines -failed multiple medications -present every day -would like to try Botox 200U next visit -UDS and pain contract signed today -if negative, can take over her oxycodone and lyrica prescriptions as per her request -made goal to replace soft drinks with electrolyte water  3) HTN: -Advised checking BP daily at home and logging results to bring into follow-up appointment with her PCP  and myself. -Reviewed BP meds today.  -Advised regarding healthy foods that can help lower blood pressure and provided with a list: 1)  citrus foods- high in vitamins and minerals 2) salmon and other fatty fish - reduces inflammation and oxylipins 3) swiss chard (leafy green)- high level of nitrates 4) pumpkin seeds- one of the best natural sources of magnesium 5) Beans and lentils- high in fiber, magnesium, and potassium 6) Berries- high in flavonoids 7) Amaranth (whole grain, can be cooked similarly to rice and oats)- high in magnesium and fiber 8) Pistachios- even more effective at reducing BP than other nuts 9) Carrots- high in phenolic compounds that relax blood vessels and reduce inflammation 10) Celery- contain phthalides that relax tissues of arterial walls 11) Tomatoes- can also improve cholesterol and reduce risk of heart disease 12) Broccoli- good source of magnesium, calcium, and potassium 13) Greek yogurt: high in potassium and calcium 14) Herbs and spices: Celery seed, cilantro, saffron, lemongrass, black cumin, ginseng, cinnamon, cardamom, sweet basil, and ginger 15) Chia and flax seeds- also help to lower cholesterol and blood sugar 16) Beets- high levels of nitrates that relax blood vessels  17) spinach and bananas- high in potassium  4) Diabetes: -check CBGs daily, log, and bring log to follow-up appointment -try to incorporate into your diet some of the following foods which are good for diabetes: 1) cinnamon- imitates effects of insulin, increasing glucose transport into cells ([place] or [place] cinnamon is best, least processed) 2) nuts- can slow down the blood sugar response of carbohydrate rich foods 3) oatmeal- contains and anti-inflammatory compound avenanthramide 4) whole-milk yogurt (best types are no sugar, [place] yogurt, or goat/sheep yogurt) 5) beans- high in protein, fiber, and vitamins, low glycemic index 6) broccoli- great source of vitamin A and C 7) quinoa- higher in protein and fiber than other grains 8) spinach- high in vitamin A, fiber, and protein 9) olive oil- reduces glucose  levels, LDL, and triglycerides 10) salmon- excellent amount of omega-3-fatty acids 11) walnuts- rich in antioxidants 12) apples- high in fiber and quercetin 13) carrots- highly nutritious with low impact on blood sugar 14) eggs- improve HDL (good cholesterol), high in protein, keep you satiated 15) turmeric: improves blood sugars, cardiovascular disease, and protects kidney health 16) garlic: improves blood sugar, blood pressure, pain 17) tomatoes: highly nutritious with low impact on blood sugar   5) left foot drop: AFO ordered from [facility]  6) Left sided post-stroke shoulder pain --Discussed Sprint PNS system as an option of pain treatment via neuromodulation. Provided following link for patient to learn more about the system: https://www.sprtherapeutics.com/. Explained mechanism of activating A beta fibers which function in touch, pressure, and vibration, to inhibit the sensations of A delta and C fibers which are responsible for pain transmission.  -continue therapy -E-stim ordered due to flaccidity, atrophy, pain  >40 minutes spent in discussion of patient's migraines, recovery from CVA, left sided post-stroke pain, left sided foot drop, diabetes, hypertension, mood, goal to reduce one medication per visit, placing all necessary orders

## 2021-08-09 NOTE — Patient Instructions (Signed)
-  Discussed following foods that may reduce pain: 1) Ginger (especially studied for arthritis)- reduce leukotriene production to decrease inflammation 2) Blueberries- high in phytonutrients that decrease inflammation 3) Salmon- marine omega-3s reduce joint swelling and pain 4) Pumpkin seeds- reduce inflammation 5) dark chocolate- reduces inflammation 6) turmeric- reduces inflammation 7) tart cherries - reduce pain and stiffness 8) extra virgin olive oil - its compound olecanthal helps to block prostaglandins  9) chili peppers- can be eaten or applied topically via capsaicin 10) mint- helpful for headache, muscle aches, joint pain, and itching 11) garlic- reduces inflammation  Link to further information on diet for chronic pain: http://www.randall.[email or web address]/

## 2021-08-10 ENCOUNTER — Other Ambulatory Visit: Payer: Self-pay

## 2021-08-10 ENCOUNTER — Ambulatory Visit (INDEPENDENT_AMBULATORY_CARE_PROVIDER_SITE_OTHER): Payer: Managed Care, Other (non HMO) | Admitting: Family Medicine

## 2021-08-10 ENCOUNTER — Telehealth: Payer: Self-pay | Admitting: *Deleted

## 2021-08-10 VITALS — BP 153/90 | HR 80 | Temp 97.3°F | Ht 67.0 in | Wt 205.0 lb

## 2021-08-10 DIAGNOSIS — E1169 Type 2 diabetes mellitus with other specified complication: Secondary | ICD-10-CM

## 2021-08-10 DIAGNOSIS — I10 Essential (primary) hypertension: Secondary | ICD-10-CM

## 2021-08-10 DIAGNOSIS — I69354 Hemiplegia and hemiparesis following cerebral infarction affecting left non-dominant side: Secondary | ICD-10-CM | POA: Diagnosis not present

## 2021-08-10 DIAGNOSIS — F331 Major depressive disorder, recurrent, moderate: Secondary | ICD-10-CM

## 2021-08-10 DIAGNOSIS — R32 Unspecified urinary incontinence: Secondary | ICD-10-CM

## 2021-08-10 DIAGNOSIS — R41841 Cognitive communication deficit: Secondary | ICD-10-CM

## 2021-08-10 DIAGNOSIS — Z794 Long term (current) use of insulin: Secondary | ICD-10-CM

## 2021-08-10 NOTE — Telephone Encounter (Signed)
Orders for e stim faxed to [facility] and arm support was faxed to [facility].

## 2021-08-10 NOTE — Patient Instructions (Addendum)
#  Diabetes - Continue current regimen - call Dr. Loanne Drilling > to schedule follow-up in next 2-4 weeks   #Stroke/Hypertension - call [facility] Neurology - initial referral was for Dr. Leonie Man

## 2021-08-10 NOTE — Assessment & Plan Note (Signed)
Follows with cardiology and has appt in 2 weeks. Only on torsemide and spironolactone. Considered restarting medications, however, per SNF discharge goal systolic was 0000000 - currently in range. Will defer to neurology and cardiology for timing of restarting medications.

## 2021-08-10 NOTE — Assessment & Plan Note (Signed)
Lab Results  Component Value Date   HGBA1C 10.7 (H) 06/25/2021   However, reports fasting in 120s range since stroke. She is taking trulicity [id number] mg and Insulin Glargine 27 units BID. Insurance change and will no longer cover glargine but will cover lantus which family reports she was doing well in rehab and the hospital. She follows with endo- Dr. Loanne Drilling. Still has about 1 month supply of insulin. The will call for follow-up and to discuss alternative therapy based on insurance.

## 2021-08-10 NOTE — Assessment & Plan Note (Signed)
Notes mood is not as good given her current hemiplegia and dependency. On fluoxetine and has psych f/u next week. Discussed it is normal to feel down and that getting support and therapy are good first steps.

## 2021-08-10 NOTE — Assessment & Plan Note (Signed)
2/2 to stroke. Working with SLP. Discussed that husband could be added as proxy to mychart to simplify communication

## 2021-08-10 NOTE — Assessment & Plan Note (Signed)
Multifactorial - notes urgency, stress, and mobility as limiting factors. Advised kegels and if no improvement in urgency could consider medication support. If still not improved urology referral for further assessment.

## 2021-08-10 NOTE — Assessment & Plan Note (Signed)
Following with PMR - appreciate support. Also seeing OT/PT and getting braces. She will reach out if needing any support.

## 2021-08-10 NOTE — Progress Notes (Signed)
Subjective:     Erika [name] is a 50 y.o. female presenting for Hospitalization Follow-up     HPI  #CVA with residual symptoms - Is seeing SLP and OT and PT - Dr. Ranell Patrick did prescribe the knee brace and is getting an arm brace  - [facility] - but difficulty getting that done - [facility] neurology   #Diabetes - Insulin 27 units bid - thought this is no longer being covered by insurance, note to endo about changing - following with Dr. Loanne Drilling - cbg 125 units fasting today - trulicity [id number] mg - continu  #HTN - has been checking at home 150/90, 160/100  #urinary incontinence - try kegel exercises - call if not improving - especially if urgency    Review of Systems   Social History   Tobacco Use  Smoking Status Every Day   Packs/day: 0.50   Years: 27.00   Pack years: 13.50   Types: Cigarettes  Smokeless Tobacco Never        Objective:    BP Readings from Last 3 Encounters:  08/10/21 (!) 153/90  08/09/21 (!) 167/101  08/01/21 (!) 144/80   Wt Readings from Last 3 Encounters:  08/10/21 205 lb (93 kg)  08/09/21 212 lb 3.2 oz (96.3 kg)  07/27/21 184 lb 8.4 oz (83.7 kg)    BP (!) 153/90   Pulse 80   Temp (!) 97.3 F (36.3 C) (Temporal)   Ht '5\' 7"'$  (1.702 m)   Wt 205 lb (93 kg)   SpO2 98%   BMI 32.11 kg/m    Physical Exam Constitutional:      General: She is not in acute distress.    Appearance: She is well-developed. She is not diaphoretic.     Comments: Sitting in wheel chair. Left wrist in brace and held at side. Left leg in partial extension  HENT:     Right Ear: External ear normal.     Left Ear: External ear normal.  Eyes:     Conjunctiva/sclera: Conjunctivae normal.  Cardiovascular:     Rate and Rhythm: Normal rate and regular rhythm.     Heart sounds: No murmur heard. Pulmonary:     Effort: Pulmonary effort is normal. No respiratory distress.     Breath sounds: Normal breath sounds. No  wheezing.  Musculoskeletal:     Cervical back: Neck supple.  Skin:    General: Skin is warm and dry.     Capillary Refill: Capillary refill takes less than 2 seconds.  Neurological:     Mental Status: She is alert. Mental status is at baseline.  Psychiatric:        Mood and Affect: Mood normal.        Behavior: Behavior normal.          Assessment & Plan:   Problem List Items Addressed This Visit       Cardiovascular and Mediastinum   HTN (hypertension), malignant    Follows with cardiology and has appt in 2 weeks. Only on torsemide and spironolactone. Considered restarting medications, however, per SNF discharge goal systolic was 0000000 - currently in range. Will defer to neurology and cardiology for timing of restarting medications.         Endocrine   Type 2 diabetes mellitus with other specified complication (HCC)    Lab Results  Component Value Date   HGBA1C 10.7 (H) 06/25/2021  However, reports fasting in 120s range since stroke. She  is taking trulicity [id number] mg and Insulin Glargine 27 units BID. Insurance change and will no longer cover glargine but will cover lantus which family reports she was doing well in rehab and the hospital. She follows with endo- Dr. Loanne Drilling. Still has about 1 month supply of insulin. The will call for follow-up and to discuss alternative therapy based on insurance.          Nervous and Auditory   Hemiplegia and hemiparesis following cerebral infarction affecting left non-dominant side ([place])    Following with PMR - appreciate support. Also seeing OT/PT and getting braces. She will reach out if needing any support.         Other   Major depressive disorder, recurrent episode, moderate ([place]) - Primary    Notes mood is not as good given her current hemiplegia and dependency. On fluoxetine and has psych f/u next week. Discussed it is normal to feel down and that getting support and therapy are good first steps.       Cognitive  communication deficit    2/2 to stroke. Working with SLP. Discussed that husband could be added as proxy to mychart to simplify communication      Urinary incontinence    Multifactorial - notes urgency, stress, and mobility as limiting factors. Advised kegels and if no improvement in urgency could consider medication support. If still not improved urology referral for further assessment.         Return in about 6 months (around 02/10/2022) for follow-up.  Lesleigh Noe, MD  This visit occurred during the SARS-CoV-2 public health emergency.  Safety protocols were in place, including screening questions prior to the visit, additional usage of staff PPE, and extensive cleaning of exam room while observing appropriate contact time as indicated for disinfecting solutions.

## 2021-08-14 ENCOUNTER — Telehealth (INDEPENDENT_AMBULATORY_CARE_PROVIDER_SITE_OTHER): Payer: 59 | Admitting: Psychiatry

## 2021-08-14 ENCOUNTER — Ambulatory Visit: Payer: Managed Care, Other (non HMO)

## 2021-08-14 ENCOUNTER — Encounter (HOSPITAL_COMMUNITY): Payer: Self-pay | Admitting: Psychiatry

## 2021-08-14 ENCOUNTER — Ambulatory Visit: Payer: Managed Care, Other (non HMO) | Admitting: Occupational Therapy

## 2021-08-14 ENCOUNTER — Other Ambulatory Visit: Payer: Self-pay

## 2021-08-14 ENCOUNTER — Encounter: Payer: Self-pay | Admitting: Occupational Therapy

## 2021-08-14 DIAGNOSIS — R2681 Unsteadiness on feet: Secondary | ICD-10-CM

## 2021-08-14 DIAGNOSIS — R269 Unspecified abnormalities of gait and mobility: Secondary | ICD-10-CM

## 2021-08-14 DIAGNOSIS — F410 Panic disorder [episodic paroxysmal anxiety] without agoraphobia: Secondary | ICD-10-CM | POA: Diagnosis not present

## 2021-08-14 DIAGNOSIS — F4310 Post-traumatic stress disorder, unspecified: Secondary | ICD-10-CM | POA: Diagnosis not present

## 2021-08-14 DIAGNOSIS — M6281 Muscle weakness (generalized): Secondary | ICD-10-CM

## 2021-08-14 DIAGNOSIS — F331 Major depressive disorder, recurrent, moderate: Secondary | ICD-10-CM | POA: Diagnosis not present

## 2021-08-14 DIAGNOSIS — R41841 Cognitive communication deficit: Secondary | ICD-10-CM | POA: Diagnosis not present

## 2021-08-14 DIAGNOSIS — R262 Difficulty in walking, not elsewhere classified: Secondary | ICD-10-CM

## 2021-08-14 MED ORDER — CHLORPROMAZINE HCL 100 MG PO TABS: ORAL_TABLET | ORAL | 2 refills | Status: DC

## 2021-08-14 MED ORDER — FLUOXETINE HCL 60 MG PO TABS: 60.0000 mg | ORAL_TABLET | Freq: Every day | ORAL | 0 refills | Status: DC

## 2021-08-14 MED ORDER — CLONAZEPAM 0.5 MG PO TABS: 0.5000 mg | ORAL_TABLET | Freq: Two times a day (BID) | ORAL | 2 refills | Status: DC

## 2021-08-14 MED ORDER — LAMOTRIGINE 200 MG PO TABS: 200.0000 mg | ORAL_TABLET | Freq: Every day | ORAL | 0 refills | Status: DC

## 2021-08-14 MED ORDER — BENZTROPINE MESYLATE 0.5 MG PO TABS: 0.5000 mg | ORAL_TABLET | Freq: Every day | ORAL | 0 refills | Status: DC

## 2021-08-14 NOTE — Therapy (Signed)
[place] [facility] SERVICES [address] [place], [place], [address] Phone: [phone]   Fax:  [phone]  Occupational Therapy Treatment  Patient Details  Name: Erika [name] MRN: [id number] Date of Birth: [birth year] Referring Provider (OT): Dr. Leeroy Cha   Encounter Date: 08/14/2021   OT End of Session - 08/14/21 1733     Visit Number 3    Number of Visits 20    Date for OT Re-Evaluation 10/25/21    Authorization Type limited to 20 visits combined/multidiscipline    Authorization Time Period Reporting period starting 08/03/2021    OT Start Time 1520    OT Stop Time 1600    OT Time Calculation (min) 40 min    Equipment Utilized During Treatment wc    Activity Tolerance Patient tolerated treatment well    Behavior During Therapy [facility] for tasks assessed/performed;Flat affect;Impulsive             Past Medical History:  Diagnosis Date   Arthralgia of temporomandibular joint    CAD, multiple vessel    a. 06/2016 Cath: ostLM 40%, ostLAD 40%, pLAD 95%, ost-pLCx 60%, pLCx 95%, mLCx 60%, mRCA 95%, D2 50%, LVSF nl;  b. 07/2016 CABG x 4 (LIMA->LAD, VG->Diag, VG->OM, VG->RCA); c. 08/2016 Cath: 3VD w/ 4/4 patent grafts. LAD distal to LIMA has diff dzs->Med rx; d. 08/2020 Cath: 4/4 patent grafts, native 3VD. EF 55-65%-->Med Rx.   Carotid arterial disease ([place])    a. 07/2016 s/p R CEA; b. 02/2021 U/S: RICA 123456, LICA 123456.   Clotting disorder ([place])    Depression    Diastolic dysfunction    a. 06/2016 Echo: EF 50-55%, mild inf wall HK, GR1DD, mild MR, RV sys fxn nl, mildly dilated LA, PASP nl   Fatty liver disease, nonalcoholic Q000111Q   History of blood transfusion    with heart surgery   HLD (hyperlipidemia)    Labile hypertension    a. prior renal ngiogram negative for RAS in 03/2016; b. catecholamines and metanephrines normal, mildly elevated renin with normal aldosterone and normal ratio in 02/2016   Myocardial infarction [facility])  2017   Obesity    PAD (peripheral artery disease) ([place])    a. 09/2018 s/p L SFA stenting; b. 07/2019 Periph Angio: Patent m/d L SFA stent w/ 100% L SFA distal to stent. L AT 100d, L Peroneal diff dzs-->Med Rx; c. 02/2021 ABIs: stable @ 0.61 on R and 0.46 on L.   PTSD (post-traumatic stress disorder)    Tobacco abuse    Type 2 diabetes mellitus ([place]) 12/2015    Past Surgical History:  Procedure Laterality Date   ABDOMINAL AORTOGRAM W/LOWER EXTREMITY [name] 10/15/2018   Procedure: ABDOMINAL AORTOGRAM W/LOWER EXTREMITY;  Surgeon: Wellington Hampshire, MD;  Location: [place] CV LAB;  Service: Cardiovascular;  Laterality: [name];   ABDOMINAL AORTOGRAM W/LOWER EXTREMITY Bilateral 08/19/2019   Procedure: ABDOMINAL AORTOGRAM W/LOWER EXTREMITY;  Surgeon: Wellington Hampshire, MD;  Location: [place] CV LAB;  Service: Cardiovascular;  Laterality: Bilateral;   CARDIAC CATHETERIZATION [name] 06/29/2016   Procedure: Left Heart Cath and Coronary Angiography;  Surgeon: Minna Merritts, MD;  Location: [place] CV LAB;  Service: Cardiovascular;  Laterality: [name];   CARDIAC CATHETERIZATION [name] 08/29/2016   Procedure: Left Heart Cath and Cors/Grafts Angiography;  Surgeon: Wellington Hampshire, MD;  Location: [place] CV LAB;  Service: Cardiovascular;  Laterality: [name];   CESAREAN SECTION     CHOLECYSTECTOMY     CORONARY ARTERY  BYPASS GRAFT [name] 07/06/2016   Procedure: CORONARY ARTERY BYPASS GRAFTING (CABG) x four, using left internal mammary artery and right leg greater saphenous vein harvested endoscopically;  Surgeon: Ivin Poot, MD;  Location: [place];  Service: Open Heart Surgery;  Laterality: [name];   ENDARTERECTOMY Right 07/06/2016   Procedure: ENDARTERECTOMY CAROTID;  Surgeon: Rosetta Posner, MD;  Location: [facility] OR;  Service: Vascular;  Laterality: Right;   ENDARTERECTOMY Right 04/27/2020   Procedure: REDO OF RIGHT ENDARTERECTOMY CAROTID;  Surgeon: Rosetta Posner, MD;  Location: [facility] OR;  Service: Vascular;  Laterality:  Right;   LEFT HEART CATH AND CORS/GRAFTS ANGIOGRAPHY [name] 08/24/2020   Procedure: LEFT HEART CATH AND CORS/GRAFTS ANGIOGRAPHY;  Surgeon: Wellington Hampshire, MD;  Location: [place] CV LAB;  Service: Cardiovascular;  Laterality: [name];   PERIPHERAL VASCULAR CATHETERIZATION [name] 04/18/2016   Procedure: Renal Angiography;  Surgeon: Wellington Hampshire, MD;  Location: [place] CV LAB;  Service: Cardiovascular;  Laterality: [name];   PERIPHERAL VASCULAR INTERVENTION Left 10/15/2018   Procedure: PERIPHERAL VASCULAR INTERVENTION;  Surgeon: Wellington Hampshire, MD;  Location: [place] CV LAB;  Service: Cardiovascular;  Laterality: Left;  Left superficial femoral   TEE WITHOUT CARDIOVERSION [name] 07/06/2016   Procedure: TRANSESOPHAGEAL ECHOCARDIOGRAM (TEE);  Surgeon: Ivin Poot, MD;  Location: [place];  Service: Open Heart Surgery;  Laterality: [name];   TONSILLECTOMY      There were no vitals filed for this visit.   Subjective Assessment - 08/14/21 1732     Subjective  Pt. reports having 7/10 cluster headache pain    Patient is accompanied by: Family member    Pertinent History HX of CAD, CABG, DM2, s/p R pontine stroke    Limitations L sided hemiplegia, impulsivity, judgment, safety awareness    Currently in Pain? Yes    Pain Score 6     Pain Location Arm    Pain Orientation Left   Pain Descriptors / Indicators Aching    Pain Type Chronic pain    Pain Onset More than a month ago            OT TREATMENT     Therapeutic Exercise:   Pt. tolerated PROM in all joint ranges for left shoulder flexion, extension, abduction, horizontal abduction, and adduction, elbow flexion, extension, forearm supination, pronation, wrist extension, digit MP, PIP, and DIP flexion, and extension. Pt. Performed AAROM at the tabletop with proximal support, and assist from the [place].   Manual Therapy:   Pt. tolerated scapular mobilizations for elevation, depression, abduction/rotation.  Pt. tolerated retrograde massage,  and soft tissue mobilizations for metacarpal spread stretches to the left hand secondary to increased edema in the left hand. Retrograde massage was performed with her UE elevated on anUE wedge placed at tabletop. Manual therapy was performed independent of, and in preparation for ther. Ex.   Pt. reports 6/10 left arm pain. Pt. tolerated ROM, and manual therapy well with decreased edema noted in the left hand following intervention. Pt. Education was provided about finding opportunities to engage the LUE in proprioceptive input, and as a gross assist during daily activities. Pt. continues to work on improving and maximizing independence with ADLs, and IADL tasks.                                      OT Education - 08/14/21 1733     Education Details PROM to LUE  Person(s) Educated Patient;Spouse    Methods Explanation;Demonstration;Verbal cues    Comprehension Verbalized understanding;Returned demonstration;Need further instruction;Verbal cues required              OT Short Term Goals - 08/04/21 0914       OT SHORT TERM GOAL #1   Title Pt will be perform HEP for LUE with min vc.    Baseline Eval: Initiated at eval, further instruction needed    Time 6    Period Weeks    Status New    Target Date 09/13/21               OT Long Term Goals - 08/04/21 0915       OT LONG TERM GOAL #1   Title Pt will increase FOTO score to 40 or better to indicate increased functional performance    Baseline Eval: 23    Time 12    Period Weeks    Status New    Target Date 10/25/21      OT LONG TERM GOAL #2   Title Pt will perform UB ADLs with set up    Baseline Eval: Mod A to perform UB ADLs    Time 12    Period Weeks    Status New    Target Date 10/25/21      OT LONG TERM GOAL #3   Title Pt will perform LB ADLs with min A    Baseline Eval: Max A to perform LB ADLs    Time 12    Period Weeks    Status New    Target Date 10/25/21      OT LONG TERM  GOAL #4   Title Pt will perform ADL transfers with distant supv    Baseline Eval: min A-mod A to perform ADL transfers    Time 12    Period Weeks    Status New    Target Date 10/25/21      OT LONG TERM GOAL #5   Title Pt will increase LUE strength by 2 MM grades to work towards use of LUE as a stabilizer for self care.    Baseline Eval: L shoulder 1/5, elbow/wrist/hand 0/5.  Unable to use LUE as a stabilzer for self care.    Time 12    Period Weeks    Status New    Target Date 10/25/21                   Plan - 08/14/21 1734     Clinical Impression Statement Pt. reports 6/10 left arm pain. Pt. tolerated ROM, and manual therapy well with decreased edema noted in the left hand following intervention. Pt. Education was provided about finding opportunities to engage the LUE in proprioceptive input, and as a gross assist during daily activities. Pt. continues to work on improving and maximizing independence with ADLs, and IADL tasks.   OT Occupational Profile and History Detailed Assessment- Review of Records and additional review of physical, cognitive, psychosocial history related to current functional performance    Occupational performance deficits (Please refer to evaluation for details): ADL's;IADL's;Leisure;Work    Marketing executive / Function / Physical Skills ADL;Continence;Dexterity;Strength;Vision;Balance;Coordination;FMC;IADL;Body mechanics;Endurance;Gait;UE functional use;Decreased knowledge of use of DME;GMC;Mobility    Rehab Potential Good    Clinical Decision Making Several treatment options, min-mod task modification necessary    Comorbidities Affecting Occupational Performance: May have comorbidities impacting occupational performance    Modification or Assistance to Complete Evaluation  Min-Moderate modification of tasks  or assist with assess necessary to complete eval    OT Frequency 2x / week    OT Duration 12 weeks    OT Treatment/Interventions Self-care/ADL  training;Therapeutic exercise;DME and/or AE instruction;Functional Mobility Training;Balance training;Electrical Stimulation;Neuromuscular education;Manual Therapy;Splinting;Visual/perceptual remediation/compensation;Moist Heat;Passive range of motion;Therapeutic activities;Patient/family education    Consulted and Agree with Plan of Care Patient;Family member/caregiver    Family Member Consulted spouse, [name]             Patient will benefit from skilled therapeutic intervention in order to improve the following deficits and impairments:   Body Structure / Function / Physical Skills: ADL, Continence, Dexterity, Strength, Vision, Balance, Coordination, [place], IADL, Body mechanics, Endurance, Gait, UE functional use, Decreased knowledge of use of DME, [place], Mobility       Visit Diagnosis: Muscle weakness (generalized)    Problem List Patient Active Problem List   Diagnosis Date Noted   Cognitive communication deficit 08/10/2021   Urinary incontinence 08/10/2021   GERD (gastroesophageal reflux disease) 08/03/2021   Chronic post-traumatic stress disorder (PTSD)    Depression with anxiety    Right pontine stroke ([place]) 06/28/2021   Hemiplegia and hemiparesis following cerebral infarction affecting left non-dominant side ([place]) 06/24/2021   Chronic diastolic CHF (congestive heart failure) ([place]) 06/24/2021   Fall 06/24/2021   Abnormal LFTs 06/24/2021   Olecranon bursitis of left elbow 06/12/2021   Chronic hip pain (Bilateral) 06/09/2021   Chronic use of opiate for therapeutic purpose 05/09/2021   Greater trochanteric bursitis of hip (Left) 03/09/2021   Bursitis of hip (Left) Q000111Q   Uncomplicated opioid dependence ([place]) 02/20/2021   Acute conjunctivitis of left eye 01/02/2021   Unintentional weight loss 11/21/2020   Broken teeth (Right) 08/02/2020   History of MI (myocardial infarction) (July 2017) 08/02/2020   Neuropathy 06/09/2020   Dental abscess 06/09/2020   Foot drop  (Left) 06/09/2020   Carotid stenosis, asymptomatic, right 04/27/2020   Chronic migraine 11/03/2019   Thrombocytosis 09/16/2019   Erythrocytosis 09/16/2019   Hypercalcemia 09/06/2019   Leukocytosis 09/06/2019   Polycythemia 09/06/2019   Chronic hip pain (Left) 08/27/2019   Osteoarthritis of hip (Left) 08/27/2019   Gluteal tendonitis of buttock (Left) 08/27/2019   Radial nerve palsy (Right) 07/15/2019   Neuropathy of radial nerve (Right) 06/30/2019   Abnormal bruising 06/25/2019   History of carotid endarterectomy (Right) 03/04/2019   Pain medication agreement signed 03/04/2019   Atypical facial pain (Right) 01/27/2019   Chronic ear pain (Right) 01/27/2019   Chronic jaw pain (Right) 01/27/2019   Geniculate Neuralgia (Right) 01/27/2019   Vitamin D deficiency 01/19/2019   Neurogenic pain 01/19/2019   Chronic anticoagulation (PLAVIX) 01/19/2019   Chronic pain syndrome 01/07/2019   Long term current use of opiate analgesic 01/07/2019   Long term prescription benzodiazepine use 01/07/2019   Pharmacologic therapy 01/07/2019   Disorder of skeletal system 01/07/2019   Problems influencing health status 01/07/2019   Chronic headaches (1ry area of Pain) (Right) 01/07/2019   Opiate use 09/24/2018   PVD (peripheral vascular disease) ([place]) 06/24/2018   Chronic ankle pain (Bilateral) 12/27/2017   Tendinopathy of gluteus medius (Right) 12/27/2017   Tendinopathy of gluteus medius (Left) 12/27/2017   Bilateral hip pain 12/26/2017   Chronic elbow pain (Left) 10/17/2017   Elevated troponin I level 05/21/2017   Major depressive disorder, recurrent episode, moderate (HCC) 11/19/2016   Insomnia 10/30/2016   Constipation 07/25/2016   S/P CABG x 4 07/06/2016   Bradycardia    CAD (coronary artery disease)  Carotid stenosis    CAD in native artery 06/29/2016   Elevated troponin 06/28/2016   Essential hypertension, malignant 06/28/2016   Mild tobacco abuse in early remission 06/28/2016    Essential hypertension    Malignant hypertension    Type 2 diabetes mellitus with other specified complication (HCC)    Chest pain with high risk for cardiac etiology 06/27/2016   NSTEMI (non-ST elevated myocardial infarction) ([place]) 06/27/2016   Proteinuria 03/15/2016   Renal artery stenosis ([place]) 03/15/2016   MDD (major depressive disorder) 10/17/2015   Agoraphobia with panic attacks 04/25/2015   HTN (hypertension), malignant 10/20/2013   Cluster headache 03/20/2012   HLD (hyperlipidemia) 07/26/2010   ADJUSTMENT DISORDER WITH MIXED FEATURES 07/26/2010    Harrel Carina, MS, OTR/L 08/14/2021, 5:35 PM  [place] [facility]IN [facility] SERVICES [address] [place], [place], [address] Phone: [phone]   Fax:  [phone]  Name: Erika [name] MRN: [id number] Date of Birth: [birth year]

## 2021-08-14 NOTE — Therapy (Signed)
[place] MAIN [facility] SERVICES [address] [place], [place], [address] Phone: [phone]   Fax:  [phone]  Physical Therapy Treatment  Patient Details  Name: Erika [name] MRN: [id number] Date of Birth: [birth year] Referring Provider (PT): Leeroy Cha MD   Encounter Date: 08/14/2021   PT End of Session - 08/14/21 1445     Visit Number 3    Number of Visits 20    Date for PT Re-Evaluation 09/14/21    Authorization Type [employer] max visits 20x per year speech, OT, PT    Authorization Time Period 08/03/21-10/26/21    Progress Note Due on Visit 10    PT Start Time 1431    PT Stop Time 1514    PT Time Calculation (min) 43 min    Equipment Utilized During Treatment Gait belt;Other (comment)   right AFO   Activity Tolerance Patient tolerated treatment well    Behavior During Therapy [facility] for tasks assessed/performed;Flat affect;Impulsive             Past Medical History:  Diagnosis Date   Arthralgia of temporomandibular joint    CAD, multiple vessel    a. 06/2016 Cath: ostLM 40%, ostLAD 40%, pLAD 95%, ost-pLCx 60%, pLCx 95%, mLCx 60%, mRCA 95%, D2 50%, LVSF nl;  b. 07/2016 CABG x 4 (LIMA->LAD, VG->Diag, VG->OM, VG->RCA); c. 08/2016 Cath: 3VD w/ 4/4 patent grafts. LAD distal to LIMA has diff dzs->Med rx; d. 08/2020 Cath: 4/4 patent grafts, native 3VD. EF 55-65%-->Med Rx.   Carotid arterial disease ([place])    a. 07/2016 s/p R CEA; b. 02/2021 U/S: RICA 123456, LICA 123456.   Clotting disorder ([place])    Depression    Diastolic dysfunction    a. 06/2016 Echo: EF 50-55%, mild inf wall HK, GR1DD, mild MR, RV sys fxn nl, mildly dilated LA, PASP nl   Fatty liver disease, nonalcoholic Q000111Q   History of blood transfusion    with heart surgery   HLD (hyperlipidemia)    Labile hypertension    a. prior renal ngiogram negative for RAS in 03/2016; b. catecholamines and metanephrines normal, mildly elevated renin with normal aldosterone and normal ratio  in 02/2016   Myocardial infarction [facility]) 2017   Obesity    PAD (peripheral artery disease) ([place])    a. 09/2018 s/p L SFA stenting; b. 07/2019 Periph Angio: Patent m/d L SFA stent w/ 100% L SFA distal to stent. L AT 100d, L Peroneal diff dzs-->Med Rx; c. 02/2021 ABIs: stable @ 0.61 on R and 0.46 on L.   PTSD (post-traumatic stress disorder)    Tobacco abuse    Type 2 diabetes mellitus ([place]) 12/2015    Past Surgical History:  Procedure Laterality Date   ABDOMINAL AORTOGRAM W/LOWER EXTREMITY [name] 10/15/2018   Procedure: ABDOMINAL AORTOGRAM W/LOWER EXTREMITY;  Surgeon: Wellington Hampshire, MD;  Location: [place] CV LAB;  Service: Cardiovascular;  Laterality: [name];   ABDOMINAL AORTOGRAM W/LOWER EXTREMITY Bilateral 08/19/2019   Procedure: ABDOMINAL AORTOGRAM W/LOWER EXTREMITY;  Surgeon: Wellington Hampshire, MD;  Location: [place] CV LAB;  Service: Cardiovascular;  Laterality: Bilateral;   CARDIAC CATHETERIZATION [name] 06/29/2016   Procedure: Left Heart Cath and Coronary Angiography;  Surgeon: Minna Merritts, MD;  Location: [place] CV LAB;  Service: Cardiovascular;  Laterality: [name];   CARDIAC CATHETERIZATION [name] 08/29/2016   Procedure: Left Heart Cath and Cors/Grafts Angiography;  Surgeon: Wellington Hampshire, MD;  Location: [place] CV LAB;  Service: Cardiovascular;  Laterality: [name];  CESAREAN SECTION     CHOLECYSTECTOMY     CORONARY ARTERY BYPASS GRAFT [name] 07/06/2016   Procedure: CORONARY ARTERY BYPASS GRAFTING (CABG) x four, using left internal mammary artery and right leg greater saphenous vein harvested endoscopically;  Surgeon: Ivin Poot, MD;  Location: [place];  Service: Open Heart Surgery;  Laterality: [name];   ENDARTERECTOMY Right 07/06/2016   Procedure: ENDARTERECTOMY CAROTID;  Surgeon: Rosetta Posner, MD;  Location: [facility] OR;  Service: Vascular;  Laterality: Right;   ENDARTERECTOMY Right 04/27/2020   Procedure: REDO OF RIGHT ENDARTERECTOMY CAROTID;  Surgeon: Rosetta Posner, MD;  Location: [facility]  OR;  Service: Vascular;  Laterality: Right;   LEFT HEART CATH AND CORS/GRAFTS ANGIOGRAPHY [name] 08/24/2020   Procedure: LEFT HEART CATH AND CORS/GRAFTS ANGIOGRAPHY;  Surgeon: Wellington Hampshire, MD;  Location: [place] CV LAB;  Service: Cardiovascular;  Laterality: [name];   PERIPHERAL VASCULAR CATHETERIZATION [name] 04/18/2016   Procedure: Renal Angiography;  Surgeon: Wellington Hampshire, MD;  Location: [place] CV LAB;  Service: Cardiovascular;  Laterality: [name];   PERIPHERAL VASCULAR INTERVENTION Left 10/15/2018   Procedure: PERIPHERAL VASCULAR INTERVENTION;  Surgeon: Wellington Hampshire, MD;  Location: [place] CV LAB;  Service: Cardiovascular;  Laterality: Left;  Left superficial femoral   TEE WITHOUT CARDIOVERSION [name] 07/06/2016   Procedure: TRANSESOPHAGEAL ECHOCARDIOGRAM (TEE);  Surgeon: Ivin Poot, MD;  Location: [place];  Service: Open Heart Surgery;  Laterality: [name];   TONSILLECTOMY      There were no vitals filed for this visit.   Subjective Assessment - 08/14/21 1444     Subjective Patient reports still having headache but otherwise doing okay. She did report falling earlier this am during pivot transfer with mother in law assisting.    Pertinent History Pt presented to [facility]21 c c/c of slurred speech, L facial droop & LUE weakness. MRI revealed acute R pontine perforator infarct with chronic R parietal & occipital infarcts. PMH: HTN, HLD, DM, tobacco abuse, PAD, CAD, CABG, dCHF, peptic foot drop, renal artery stenosis, MI and CAD (s/p of R CEA 2017), PTSD, adjustment disorder, obesity, Left hip OA s/p OPPT 2020. Pt DC 06/28/21 to inpatient rehab in [place], [place] to home 08/01/21. AT CIR pt worked extensively on STS with MinA or Supervision and HW, Stand and squat pivot transfers with MinA, AMB progressed to ~47f max, mostly with HW (some RW use with fixed LUE on handle), but was mostly limited in further progression of distance 2/2 chronic PAD ischaemic pain or Left knee buckling despite AFO  use. CIR recommending update to more supporting AFO at transfer to OPPT. Since home, pt is using WC to navigate the environment, family assists with ADL performance, WC still needs to be modified to fit through BR doorframe. Pt reports not feeling safe with HW at present. Husband has been helping with gnelte stretching in mornings as pt sleeps in a flexion synergy posture on LLE.    Limitations Walking;Standing    How long can you sit comfortably? Generally well tolerated with occasional buttocks soreness    How long can you stand comfortably? At eval: tolerates <2 minutes prior to ischemic leg pain    How long can you walk comfortably? Longest distance at CIR last month ~218f    Patient Stated Goals Return indpendent ambulation    Currently in Pain? Yes    Pain Score 8     Pain Location Head    Pain Orientation Right;Lateral    Pain Descriptors /  Indicators Aching    Pain Type Chronic pain    Pain Onset More than a month ago    Pain Frequency Constant             Interventions:  Patient's husband donned AFO with ant support (supplied by PT from closet)  as this improved pt c/o knee buckling and instability. Issued signed order for AFO and patient stated they would take to [facility] in [place].   Gait training:  -Ambulation with hemi walker, Min A from therapist to prevent loss of balance and with WC follow via her husband.   -Pt required frequent VC, TC in order to facilitate proper placement of hemi walker to both allow larger step length as well as to prevent her R LE from catching on the leg of hemiwalker during swing phase of gait.   -84 feet with min A for STS, CGA for ambulation. Patient presents with right left side neglect nearing colliding with corner wall and veering to left requiring min A to correct. She was able to improve with gait sequencing with mod/max cues.  Therapeutic Exercises:  LAQ: x 10 with mod/min A left LE  for full range and cues for controlling eccentric  portion of movement- Patient exhibited only ability to minimally lift LE off floor- receptive to eccentric control cues yet difficulty executing.     Transfer training:  First attempt pt required min A to prevent Lob, increased time, and frequent cues in order to facilitate proper movement- including scooting to edge and hand/foot placement and to push up from armrest on right. Patient did demo improved ability after attempting multiple attempts (3x)   Education provided throughout session via VC/TC and demonstration to facilitate movement at target joints and correct muscle activation for all testing and exercises performed.      Clinical Impression: Patient demonstrated improved overall gait distance today but continues to require constant VC and min assist to ambulate safely. She did not have any significant knee buckling and AFO working well. She does have in her possession the signed order for AFO and going to take to [place]. She presents with good motivation and great caregiver support and responded well to all cues today. Pt will benefit from skilled PT evaluation to maximize safety, independence, and tolerance to basic moblity required for ADL performance and accessing the community and to reduce risk of falls in the home.                       PT Education - 08/14/21 2251     Education Details Gait and exercise technique.    Person(s) Educated Patient    Methods Explanation;Demonstration;Tactile cues;Verbal cues    Comprehension Verbalized understanding;Returned demonstration;Verbal cues required;Tactile cues required;Need further instruction              PT Short Term Goals - 08/03/21 1645       PT SHORT TERM GOAL #1   Title Pt to demonstrate improved tolerance to standing >3 minutes without physical assist or LOB. DME ad lib    Baseline <2 minutes at eval    Time 4    Period Weeks    Status New    Target Date 08/31/21      PT SHORT TERM GOAL #2    Title Pt to demonstrate perfromance of 5xSTS c AFO/Hemi walker at supervision level independence without LOB or physical assist.    Baseline Needs minGuard to MinA to perform at eval  Time 4    Period Weeks    Status New    Target Date 08/31/21      PT SHORT TERM GOAL #3   Title Pt to demonstrate AMB ~43f at supervision level with AFO, HW without LOB.    Baseline [facility] eval [facility] and [facility].    Time 6    Period Weeks    Status New    Target Date 09/14/21      PT SHORT TERM GOAL #4   Title Pt to demonstrate TUG test <120sec c HW and AFO at minguard - MinA level to improve independence with transfers and reduce falls risk at home.    Baseline No tug performed at eval:    Time 7    Period Weeks    Status New    Target Date 09/21/21               PT Long Term Goals - 08/03/21 1647       PT LONG TERM GOAL #1   Title Pt to demonstrate perfromance of 5xSTS c AFO/Hemi walker at supervision level independence without LOB or physical assist in less than 20sec.    Baseline Unable at eval, requires minguard to minA    Time 8    Period Weeks    Status New    Target Date 09/28/21      PT LONG TERM GOAL #2   Title Pt to demonstrate FOTO score improvement >10 points to demonstrate improved perception of ability in ADL    Baseline 23 at eval:    Time 8    Period Weeks    Status New    Target Date 09/28/21      PT LONG TERM GOAL #3   Title Pt able to demonstrate stand pivot transfer with HW and AFO without LOB at supervision level to modI.    Baseline Needs minGuard to MinA for safety    Time 8    Period Weeks    Status New    Target Date 09/28/21      PT LONG TERM GOAL #4   Title Pt to demonstrate TUG test <60sec c HW and AFO at supervision level to improve independence with transfers and reduce falls risk at home.    [facility]   Time 12    Period Weeks    Status New    Target Date 10/26/21                   Plan - 08/14/21 2254      Clinical Impression Statement Patient demonstrated improved overall gait distance today but continues to require constant VC and min assist to ambulate safely. She did not have any significant knee buckling and AFO working well. She does have in her possession the signed order for AFO [place] She presents with good motivation and great caregiver support and responded well to all cues today. Pt will benefit from skilled PT evaluation to maximize safety, independence, and tolerance to basic moblity required for ADL performance and accessing the community and to reduce risk of falls in the home.    Personal Factors and Comorbidities Age;Comorbidity 3+;Fitness;Past/Current Experience;Time since onset of injury/illness/exacerbation    Comorbidities DM, HTN, PVD, clotting disorder, CAD    Examination-Activity Limitations Squat;Stairs;Bathing;Bed Mobility;Dressing;Reach Overhead;Hygiene/Grooming;Bend;Carry;Locomotion Level;Transfers;Toileting;Stand    Examination-Participation Restrictions Cleaning;Community Activity;Occupation;Laundry;Driving;Yard Work    SScientist, research (medical)  Potential Good    PT Frequency 2x / week    PT Treatment/Interventions Electrical Stimulation;Gait training;Stair training;Functional mobility training;Therapeutic activities;Therapeutic exercise;Balance training;Neuromuscular re-education;Patient/family education;Manual techniques;Wheelchair mobility training;Energy conservation;Passive range of motion;Orthotic Fit/Training;DME Instruction;ADLs/Self Care Home Management    PT Next Visit Plan work toward supervision level STS and SPT c HW, sustained standing tolerance.    PT Home Exercise Plan Not updated this visit- Encouraged seated LE strengthening and practicing gait with husband.    Consulted and Agree with Plan of Care Patient;Family member/caregiver    Family Member Consulted husband              Patient will benefit from skilled therapeutic intervention in order to improve the following deficits and impairments:  Decreased endurance, Decreased mobility, Difficulty walking, Hypomobility, Decreased balance, Abnormal gait, Increased muscle spasms, Cardiopulmonary status limiting activity, Impaired flexibility, Hypermobility, Decreased strength, Decreased activity tolerance  Visit Diagnosis: Abnormality of gait and mobility  Difficulty in walking, not elsewhere classified  Muscle weakness (generalized)  Unsteadiness on feet     Problem List Patient Active Problem List   Diagnosis Date Noted   Cognitive communication deficit 08/10/2021   Urinary incontinence 08/10/2021   GERD (gastroesophageal reflux disease) 08/03/2021   Chronic post-traumatic stress disorder (PTSD)    Depression with anxiety    Right pontine stroke ([place]) 06/28/2021   Hemiplegia and hemiparesis following cerebral infarction affecting left non-dominant side ([place]) 06/24/2021   Chronic diastolic CHF (congestive heart failure) ([place]) 06/24/2021   Fall 06/24/2021   Abnormal LFTs 06/24/2021   Olecranon bursitis of left elbow 06/12/2021   Chronic hip pain (Bilateral) 06/09/2021   Chronic use of opiate for therapeutic purpose 05/09/2021   Greater trochanteric bursitis of hip (Left) 03/09/2021   Bursitis of hip (Left) Q000111Q   Uncomplicated opioid dependence ([place]) 02/20/2021   Acute conjunctivitis of left eye 01/02/2021   Unintentional weight loss 11/21/2020   Broken teeth (Right) 08/02/2020   History of MI (myocardial infarction) (July 2017) 08/02/2020   Neuropathy 06/09/2020   Dental abscess 06/09/2020   Foot drop (Left) 06/09/2020   Carotid stenosis, asymptomatic, right 04/27/2020   Chronic migraine 11/03/2019   Thrombocytosis 09/16/2019   Erythrocytosis 09/16/2019   Hypercalcemia 09/06/2019   Leukocytosis 09/06/2019   Polycythemia 09/06/2019   Chronic hip pain (Left) 08/27/2019    Osteoarthritis of hip (Left) 08/27/2019   Gluteal tendonitis of buttock (Left) 08/27/2019   Radial nerve palsy (Right) 07/15/2019   Neuropathy of radial nerve (Right) 06/30/2019   Abnormal bruising 06/25/2019   History of carotid endarterectomy (Right) 03/04/2019   Pain medication agreement signed 03/04/2019   Atypical facial pain (Right) 01/27/2019   Chronic ear pain (Right) 01/27/2019   Chronic jaw pain (Right) 01/27/2019   Geniculate Neuralgia (Right) 01/27/2019   Vitamin D deficiency 01/19/2019   Neurogenic pain 01/19/2019   Chronic anticoagulation (PLAVIX) 01/19/2019   Chronic pain syndrome 01/07/2019   Long term current use of opiate analgesic 01/07/2019   Long term prescription benzodiazepine use 01/07/2019   Pharmacologic therapy 01/07/2019   Disorder of skeletal system 01/07/2019   Problems influencing health status 01/07/2019   Chronic headaches (1ry area of Pain) (Right) 01/07/2019   Opiate use 09/24/2018   PVD (peripheral vascular disease) ([place]) 06/24/2018   Chronic ankle pain (Bilateral) 12/27/2017   Tendinopathy of gluteus medius (Right) 12/27/2017   Tendinopathy of gluteus medius (Left) 12/27/2017   Bilateral hip pain 12/26/2017   Chronic elbow pain (Left) 10/17/2017   Elevated troponin I level 05/21/2017  Major depressive disorder, recurrent episode, moderate ([employer]) 11/19/2016   Insomnia 10/30/2016   Constipation 07/25/2016   S/P CABG x 4 07/06/2016   Bradycardia    CAD (coronary artery disease)    Carotid stenosis    CAD in native artery 06/29/2016   Elevated troponin 06/28/2016   Essential hypertension, malignant 06/28/2016   Mild tobacco abuse in early remission 06/28/2016   Essential hypertension    Malignant hypertension    Type 2 diabetes mellitus with other specified complication (HCC)    Chest pain with high risk for cardiac etiology 06/27/2016   NSTEMI (non-ST elevated myocardial infarction) ([place]) 06/27/2016   Proteinuria 03/15/2016   Renal  artery stenosis ([place]) 03/15/2016   MDD (major depressive disorder) 10/17/2015   Agoraphobia with panic attacks 04/25/2015   HTN (hypertension), malignant 10/20/2013   Cluster headache 03/20/2012   HLD (hyperlipidemia) 07/26/2010   ADJUSTMENT DISORDER WITH MIXED FEATURES 07/26/2010    Lewis Moccasin, PT 08/15/2021, 11:48 AM  [place] MAIN [facility] SERVICES [address] [place], [place], [address] Phone: [phone]   Fax:  [phone]  Name: Erika [name] MRN: [id number] Date of Birth: 04-13-1971
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
## 2021-08-15 ENCOUNTER — Encounter (HOSPITAL_BASED_OUTPATIENT_CLINIC_OR_DEPARTMENT_OTHER): Payer: Managed Care, Other (non HMO) | Admitting: Physical Medicine and Rehabilitation

## 2021-08-15 DIAGNOSIS — Z79899 Other long term (current) drug therapy: Secondary | ICD-10-CM

## 2021-08-15 DIAGNOSIS — G43909 Migraine, unspecified, not intractable, without status migrainosus: Secondary | ICD-10-CM

## 2021-08-15 DIAGNOSIS — Z79891 Long term (current) use of opiate analgesic: Secondary | ICD-10-CM

## 2021-08-15 DIAGNOSIS — G894 Chronic pain syndrome: Secondary | ICD-10-CM | POA: Diagnosis not present

## 2021-08-15 DIAGNOSIS — M6281 Muscle weakness (generalized): Secondary | ICD-10-CM

## 2021-08-15 DIAGNOSIS — M25512 Pain in left shoulder: Secondary | ICD-10-CM

## 2021-08-15 MED ORDER — OXYCODONE HCL 5 MG PO TABS: 5.0000 mg | ORAL_TABLET | Freq: Every day | ORAL | 0 refills | Status: DC | PRN

## 2021-08-15 NOTE — Progress Notes (Signed)
Subjective:    Patient ID: Erika [name], female    DOB: 1971/04/15, 50 y.o.   MRN: [id number]  HPI An audio/video tele-health visit is felt to be the most appropriate encounter for this patient at this time. This is a follow up tele-visit via MyChart. The patient is at home. MD is at office.   Erika [name] is a 50 year old woman who presents for f/u of CVA and headaches.   -She has been doing well at home, but at times feels she is a burden to her husband and family despite his reassurance that she is not -needs scripts for [employer] receiver, sensory, and transmitter- her daughter has this so she is familiar with its use -Her CBGs have recently been between 120 and 130 -migraines continue to be severe, present every day, despite use of oxycodone and Lyrica. She would like to try Botox -having about one episode of urinary incontinence daily, usually at night -she would like to decrease her medications as it depresses her to be taking so many. She is ok to decrease oxycodone to once per day. Needs refill. -she had a fall last week when using the bedside commode- her legs buckled. She has had no more falls since then   Pain Inventory Average Pain 7 Pain Right Now 7 My pain is constant and dull  LOCATION OF PAIN  head  BOWEL Number of stools per week: 3 Oral laxative use Yes  Type of laxative miralax   BLADDER  Bladder incontinence Yes    Mobility ability to climb steps?  no do you drive?  no use a wheelchair needs help with transfers Do you have any goals in this area?  yes  Function disabled: date disabled . I need assistance with the following:  dressing, bathing, toileting, meal prep, household duties, and shopping  Neuro/Psych bladder control problems bowel control problems trouble walking spasms confusion depression anxiety  Prior Studies Any changes since last visit?  no  Physicians involved in your care Any changes since last visit?   no Primary care sees primary tomorrow Neurologist has not heard from [place] yet   Family History  Adopted: Yes  Problem Relation Age of Onset   Diabetes Mother    Diabetes Father    Alcohol abuse Father    Heart disease Father    Drug abuse Father    Stroke Sister    Anxiety disorder Sister    Social History   Socioeconomic History   Marital status: Married    Spouse name: Not on file   Number of children: 1   Years of education: 14   Highest education level: Not on file  Occupational History   Occupation: Disability  Tobacco Use   Smoking status: Every Day    Packs/day: 0.50    Years: 27.00    Pack years: 13.50    Types: Cigarettes   Smokeless tobacco: Never  Vaping Use   Vaping Use: Former  Substance and Sexual Activity   Alcohol use: Not Currently    Alcohol/week: 0.0 standard drinks    Comment: socially   Drug use: No   Sexual activity: Yes    Partners: Male    Birth control/protection: None  Other Topics Concern   Not on file  Social History Narrative   11/21/20   From: [name]   Living: with husband, [name] (2009) and special needs daughter    Work: disability       Family: daughter - Erika [name] (  special needs, lives with her) and 2 grown step children - [name] and [name]       Enjoys: play with dogs - 2 german Shepard, mut, and blue tick walker mix      Exercise: PAD limits exercise   Diet: diabetic diet and low potassium due to daughter      Safety   Seat belts: Yes    Guns: Yes  and secure   Safe in relationships: Yes    Social Determinants of Health   Financial Resource Strain: Not on file  Food Insecurity: Not on file  Transportation Needs: Not on file  Physical Activity: Not on file  Stress: Not on file  Social Connections: Not on file   Past Surgical History:  Procedure Laterality Date   ABDOMINAL AORTOGRAM W/LOWER EXTREMITY [name] 10/15/2018   Procedure: ABDOMINAL AORTOGRAM W/LOWER EXTREMITY;  Surgeon: Wellington Hampshire, MD;   Location: [facility];  Service: Cardiovascular;  Laterality: [name];   ABDOMINAL AORTOGRAM W/LOWER EXTREMITY Bilateral 08/19/2019   Procedure: ABDOMINAL AORTOGRAM W/LOWER EXTREMITY;  Surgeon: Wellington Hampshire, MD;  Location: [facility];  Service: Cardiovascular;  Laterality: Bilateral;   CARDIAC CATHETERIZATION [name] 06/29/2016   Procedure: Left Heart Cath and Coronary Angiography;  Surgeon: Minna Merritts, MD;  Location: [facility];  Service: Cardiovascular;  Laterality: [name];   CARDIAC CATHETERIZATION [name] 08/29/2016   Procedure: Left Heart Cath and Cors/Grafts Angiography;  Surgeon: Wellington Hampshire, MD;  Location: [facility];  Service: Cardiovascular;  Laterality: [name];   CESAREAN SECTION     CHOLECYSTECTOMY     CORONARY ARTERY BYPASS GRAFT [name] 07/06/2016   Procedure: CORONARY ARTERY BYPASS GRAFTING (CABG) x four, using left internal mammary artery and right leg greater saphenous vein harvested endoscopically;  Surgeon: Ivin Poot, MD;  Location: [facility];  Service: Open Heart Surgery;  Laterality: [name];   ENDARTERECTOMY Right 07/06/2016   Procedure: ENDARTERECTOMY CAROTID;  Surgeon: Rosetta Posner, MD;  Location: [facility];  Service: Vascular;  Laterality: Right;   ENDARTERECTOMY Right 04/27/2020   Procedure: REDO OF RIGHT ENDARTERECTOMY CAROTID;  Surgeon: Rosetta Posner, MD;  Location: [facility] OR;  Service: Vascular;  Laterality: Right;   LEFT HEART CATH AND CORS/GRAFTS ANGIOGRAPHY [name] 08/24/2020   Procedure: LEFT HEART CATH AND CORS/GRAFTS ANGIOGRAPHY;  Surgeon: Wellington Hampshire, MD;  Location: [facility];  Service: Cardiovascular;  Laterality: [name];   PERIPHERAL VASCULAR CATHETERIZATION [name] 04/18/2016   Procedure: Renal Angiography;  Surgeon: Wellington Hampshire, MD;  Location: [facility];  Service: Cardiovascular;  Laterality: [name];   PERIPHERAL VASCULAR INTERVENTION Left 10/15/2018   Procedure: PERIPHERAL VASCULAR INTERVENTION;  Surgeon: Wellington Hampshire, MD;   Location: [facility];  Service: Cardiovascular;  Laterality: Left;  Left superficial femoral   TEE WITHOUT CARDIOVERSION [name] 07/06/2016   Procedure: TRANSESOPHAGEAL ECHOCARDIOGRAM (TEE);  Surgeon: Ivin Poot, MD;  Location: [facility];  Service: Open Heart Surgery;  Laterality: [name];   TONSILLECTOMY     Past Medical History:  Diagnosis Date   Arthralgia of temporomandibular joint    CAD, multiple vessel    a. 06/2016 Cath: ostLM 40%, ostLAD 40%, pLAD 95%, ost-pLCx 60%, pLCx 95%, mLCx 60%, mRCA 95%, D2 50%, LVSF nl;  b. 07/2016 CABG x 4 (LIMA->LAD, VG->Diag, VG->OM, VG->RCA); c. 08/2016 Cath: 3VD w/ 4/4 patent grafts. LAD distal to LIMA has diff dzs->Med rx; d. 08/2020 Cath: 4/4 patent grafts, native 3VD. EF 55-65%-->Med Rx.  Carotid arterial disease ([place])    a. 07/2016 s/p R CEA; b. 02/2021 U/S: RICA 123456, LICA 123456.   Clotting disorder ([place])    Depression    Diastolic dysfunction    a. 06/2016 Echo: EF 50-55%, mild inf wall HK, GR1DD, mild MR, RV sys fxn nl, mildly dilated LA, PASP nl   Fatty liver disease, nonalcoholic [id number]   History of blood transfusion    with heart surgery   HLD (hyperlipidemia)    Labile hypertension    a. prior renal ngiogram negative for RAS in 03/2016; b. catecholamines and metanephrines normal, mildly elevated renin with normal aldosterone and normal ratio in 02/2016   Myocardial infarction [facility]) 2017   Obesity    PAD (peripheral artery disease) ([place])    a. 09/2018 s/p L SFA stenting; b. 07/2019 Periph Angio: Patent m/d L SFA stent w/ 100% L SFA distal to stent. L AT 100d, L Peroneal diff dzs-->Med Rx; c. 02/2021 ABIs: stable @ 0.61 on R and 0.46 on L.   PTSD (post-traumatic stress disorder)    Tobacco abuse    Type 2 diabetes mellitus ([place]) 12/2015   There were no vitals taken for this visit.  Opioid Risk Score:   Fall Risk Score:  `1  Depression screen PHQ 2/9  Depression screen [facility] 2/9 08/09/2021 05/10/2021 02/23/2021 02/20/2021 11/23/2020 11/21/2020  08/02/2020  Decreased Interest 2 0 0 0 0 2 0  Down, Depressed, Hopeless 3 0 0 0 0 3 0  PHQ - 2 Score 5 0 0 0 0 5 0  Altered sleeping 0 - - - - 3 -  Tired, decreased energy 3 - - - - 2 -  Change in appetite 2 - - - - 2 -  Feeling bad or failure about yourself  3 - - - - 2 -  Trouble concentrating 3 - - - - 2 -  Moving slowly or fidgety/restless 2 - - - - 1 -  Suicidal thoughts 0 - - - - 1 -  PHQ-9 Score 18 - - - - 18 -  Difficult doing work/chores Very difficult - - - - Very difficult -  Some recent data might be hidden     Review of Systems  Constitutional: Negative.   HENT: Negative.    Eyes: Negative.   Respiratory: Negative.    Cardiovascular: Negative.   Gastrointestinal:        Bowel control  Endocrine: Negative.   Genitourinary:        Bladder control  Musculoskeletal:  Positive for gait problem.  Skin: Negative.   Allergic/Immunologic: Negative.   Neurological:  Positive for weakness.  Hematological:  Bruises/bleeds easily.       Plavix  Psychiatric/Behavioral:  Positive for confusion and dysphoric mood. The patient is nervous/anxious.   All other systems reviewed and are negative.     Objective:   Physical Exam Not performed since seen via MyChart.       Assessment & Plan:  Mrs. [place] is a very pleasant 50 year old woman who presents for follow-up of fall post-CVA, and chronic headaches.   1) CVA: Encouraged increasing walking distance every day -referred to neurology for follow-up -continue home therapies  2) Migraines -failed multiple medications -present every day -would like to try Botox 200U next visit -UDS and pain contract signed today -if negative, can take over her oxycodone and lyrica prescriptions as per her request -made goal to replace soft drinks with electrolyte water -decrease oxycodone to once per  night. Provided refill today  3) HTN: -Advised checking BP daily at home and logging results to bring into follow-up appointment with  her PCP and myself. -Reviewed BP meds today.  -Advised regarding healthy foods that can help lower blood pressure and provided with a list: 1) citrus foods- high in vitamins and minerals 2) salmon and other fatty fish - reduces inflammation and oxylipins 3) swiss chard (leafy green)- high level of nitrates 4) pumpkin seeds- one of the best natural sources of magnesium 5) Beans and lentils- high in fiber, magnesium, and potassium 6) Berries- high in flavonoids 7) Amaranth (whole grain, can be cooked similarly to rice and oats)- high in magnesium and fiber 8) Pistachios- even more effective at reducing BP than other nuts 9) Carrots- high in phenolic compounds that relax blood vessels and reduce inflammation 10) Celery- contain phthalides that relax tissues of arterial walls 11) Tomatoes- can also improve cholesterol and reduce risk of heart disease 12) Broccoli- good source of magnesium, calcium, and potassium 13) Greek yogurt: high in potassium and calcium 14) Herbs and spices: Celery seed, cilantro, saffron, lemongrass, black cumin, ginseng, cinnamon, cardamom, sweet basil, and ginger 15) Chia and flax seeds- also help to lower cholesterol and blood sugar 16) Beets- high levels of nitrates that relax blood vessels  17) spinach and bananas- high in potassium  4) Diabetes: -check CBGs daily, log, and bring log to follow-up appointment -try to incorporate into your diet some of the following foods which are good for diabetes: 1) cinnamon- imitates effects of insulin, increasing glucose transport into cells ([place] or [place] cinnamon is best, least processed) 2) nuts- can slow down the blood sugar response of carbohydrate rich foods 3) oatmeal- contains and anti-inflammatory compound avenanthramide 4) whole-milk yogurt (best types are no sugar, [place] yogurt, or goat/sheep yogurt) 5) beans- high in protein, fiber, and vitamins, low glycemic index 6) broccoli- great source of vitamin A and  C 7) quinoa- higher in protein and fiber than other grains 8) spinach- high in vitamin A, fiber, and protein 9) olive oil- reduces glucose levels, LDL, and triglycerides 10) salmon- excellent amount of omega-3-fatty acids 11) walnuts- rich in antioxidants 12) apples- high in fiber and quercetin 13) carrots- highly nutritious with low impact on blood sugar 14) eggs- improve HDL (good cholesterol), high in protein, keep you satiated 15) turmeric: improves blood sugars, cardiovascular disease, and protects kidney health 16) garlic: improves blood sugar, blood pressure, pain 17) tomatoes: highly nutritious with low impact on blood sugar   5) left foot drop: continue AFO ordered from [facility]  6) Left sided post-stroke shoulder pain --Discussed [employer] PNS system as an option of pain treatment via neuromodulation. Provided following link for patient to learn more about the system: https://www.sprtherapeutics.com/. Explained mechanism of activating A beta fibers which function in touch, pressure, and vibration, to inhibit the sensations of A delta and C fibers which are responsible for pain transmission.   -continue therapy -E-stim ordered due to flaccidity, atrophy, pain- discussed where e-stim should be placed  7) Quadriceps weakness contributing to fall- recommended seated quadriceps strengthening exercises daily  8) Insomnia: resolved, discontinue trazodone.

## 2021-08-16 ENCOUNTER — Telehealth: Payer: Self-pay | Admitting: Endocrinology

## 2021-08-16 ENCOUNTER — Other Ambulatory Visit: Payer: Self-pay

## 2021-08-16 ENCOUNTER — Ambulatory Visit (INDEPENDENT_AMBULATORY_CARE_PROVIDER_SITE_OTHER): Payer: Managed Care, Other (non HMO) | Admitting: Endocrinology

## 2021-08-16 VITALS — BP 156/84 | HR 91 | Ht 67.0 in | Wt 204.2 lb

## 2021-08-16 DIAGNOSIS — Z794 Long term (current) use of insulin: Secondary | ICD-10-CM

## 2021-08-16 DIAGNOSIS — E1169 Type 2 diabetes mellitus with other specified complication: Secondary | ICD-10-CM

## 2021-08-16 DIAGNOSIS — R110 Nausea: Secondary | ICD-10-CM | POA: Diagnosis not present

## 2021-08-16 DIAGNOSIS — E1159 Type 2 diabetes mellitus with other circulatory complications: Secondary | ICD-10-CM | POA: Diagnosis not present

## 2021-08-16 DIAGNOSIS — E0865 Diabetes mellitus due to underlying condition with hyperglycemia: Secondary | ICD-10-CM

## 2021-08-16 DIAGNOSIS — IMO0002 Reserved for concepts with insufficient information to code with codable children: Secondary | ICD-10-CM

## 2021-08-16 LAB — POCT GLYCOSYLATED HEMOGLOBIN (HGB A1C): Hemoglobin A1C: 7.4 % — AB (ref 4.0–5.6)

## 2021-08-16 MED ORDER — INSULIN GLARGINE-YFGN 100 UNIT/ML ~~LOC~~ SOPN: 54.0000 [IU] | PEN_INJECTOR | SUBCUTANEOUS | 3 refills | Status: DC

## 2021-08-16 NOTE — Telephone Encounter (Signed)
Erika [name] with [facility] requests to be called at ph# [phone] re: Changing RX received from Lantus to Levemir

## 2021-08-16 NOTE — Patient Instructions (Addendum)
Your blood pressure is high today.  Please see your primary care provider soon, to have these rechecked.   Please continue the same 2 diabetes medications.   Please come back for a follow-up appointment in 3 months.  check your blood sugar twice a day.  vary the time of day when you check, between before the 3 meals, and at bedtime.  also check if you have symptoms of your blood sugar being too high or too low.  please keep a record of the readings and bring it to your next appointment here (or you can bring the meter itself).  You can write it on any piece of paper.  please call us sooner if your blood sugar goes below 70, or if you have a lot of readings over 200.

## 2021-08-16 NOTE — Progress Notes (Signed)
Subjective:    Patient ID: Erika [name], female    DOB: January 16, 1971, 50 y.o.   MRN: [id number]  HPI Pt returns for f/u of diabetes mellitus:  DM type: Insulin-requiring type 2 Dx'ed: 99991111 Complications: CAD, CVA and PAD.  Therapy: insulin since 0000000, and Trulicity. GDM: never DKA: never Severe hypoglycemia: never Pancreatitis: never Pancreatic imaging: normal on 2010 CT SDOH: due to noncompliance with insulin, she is not a candidate for multiple daily injections.   Other: none Interval history: no cbg record, but states cbg's vary from 77-240.  Pt says she never misses meds.  She recently had CVA.  She takes zofran just 1-2 per month.  She has quit smoking.  Husb wants to reduce # of rx's Past Medical History:  Diagnosis Date   Arthralgia of temporomandibular joint    CAD, multiple vessel    a. 06/2016 Cath: ostLM 40%, ostLAD 40%, pLAD 95%, ost-pLCx 60%, pLCx 95%, mLCx 60%, mRCA 95%, D2 50%, LVSF nl;  b. 07/2016 CABG x 4 (LIMA->LAD, VG->Diag, VG->OM, VG->RCA); c. 08/2016 Cath: 3VD w/ 4/4 patent grafts. LAD distal to LIMA has diff dzs->Med rx; d. 08/2020 Cath: 4/4 patent grafts, native 3VD. EF 55-65%-->Med Rx.   Carotid arterial disease ([place])    a. 07/2016 s/p R CEA; b. 02/2021 U/S: RICA 123456, LICA 123456.   Clotting disorder ([place])    Depression    Diastolic dysfunction    a. 06/2016 Echo: EF 50-55%, mild inf wall HK, GR1DD, mild MR, RV sys fxn nl, mildly dilated LA, PASP nl   Fatty liver disease, nonalcoholic [id number]   History of blood transfusion    with heart surgery   HLD (hyperlipidemia)    Labile hypertension    a. prior renal ngiogram negative for RAS in 03/2016; b. catecholamines and metanephrines normal, mildly elevated renin with normal aldosterone and normal ratio in 02/2016   Myocardial infarction [facility]) 2017   Obesity    PAD (peripheral artery disease) ([place])    a. 09/2018 s/p L SFA stenting; b. 07/2019 Periph Angio: Patent m/d L SFA stent w/ 100% L SFA distal to stent. L  AT 100d, L Peroneal diff dzs-->Med Rx; c. 02/2021 ABIs: stable @ 0.61 on R and 0.46 on L.   PTSD (post-traumatic stress disorder)    Tobacco abuse    Type 2 diabetes mellitus ([place]) 12/2015    Past Surgical History:  Procedure Laterality Date   ABDOMINAL AORTOGRAM W/LOWER EXTREMITY [name] 10/15/2018   Procedure: ABDOMINAL AORTOGRAM W/LOWER EXTREMITY;  Surgeon: Wellington Hampshire, MD;  Location: [place] [facility];  Service: Cardiovascular;  Laterality: [name];   ABDOMINAL AORTOGRAM W/LOWER EXTREMITY Bilateral 08/19/2019   Procedure: ABDOMINAL AORTOGRAM W/LOWER EXTREMITY;  Surgeon: Wellington Hampshire, MD;  Location: [place] [facility];  Service: Cardiovascular;  Laterality: Bilateral;   CARDIAC CATHETERIZATION [name] 06/29/2016   Procedure: Left Heart Cath and Coronary Angiography;  Surgeon: Minna Merritts, MD;  Location: [place] [facility];  Service: Cardiovascular;  Laterality: [name];   CARDIAC CATHETERIZATION [name] 08/29/2016   Procedure: Left Heart Cath and Cors/Grafts Angiography;  Surgeon: Wellington Hampshire, MD;  Location: [place] [facility];  Service: Cardiovascular;  Laterality: [name];   CESAREAN SECTION     CHOLECYSTECTOMY     CORONARY ARTERY BYPASS GRAFT [name] 07/06/2016   Procedure: CORONARY ARTERY BYPASS GRAFTING (CABG) x four, using left internal mammary artery and right leg greater saphenous vein harvested endoscopically;  Surgeon: Ivin Poot, MD;  Location: [place];  Service: Open Heart Surgery;  Laterality: [name];   ENDARTERECTOMY Right 07/06/2016   Procedure: ENDARTERECTOMY CAROTID;  Surgeon: Rosetta Posner, MD;  Location: [facility] OR;  Service: Vascular;  Laterality: Right;   ENDARTERECTOMY Right 04/27/2020   Procedure: REDO OF RIGHT ENDARTERECTOMY CAROTID;  Surgeon: Rosetta Posner, MD;  Location: [facility] OR;  Service: Vascular;  Laterality: Right;   LEFT HEART CATH AND CORS/GRAFTS ANGIOGRAPHY [name] 08/24/2020   Procedure: LEFT HEART CATH AND CORS/GRAFTS ANGIOGRAPHY;  Surgeon: Wellington Hampshire, MD;  Location: [place] [facility];  Service: Cardiovascular;  Laterality: [name];   PERIPHERAL VASCULAR CATHETERIZATION [name] 04/18/2016   Procedure: Renal Angiography;  Surgeon: Wellington Hampshire, MD;  Location: [place] [facility];  Service: Cardiovascular;  Laterality: [name];   PERIPHERAL VASCULAR INTERVENTION Left 10/15/2018   Procedure: PERIPHERAL VASCULAR INTERVENTION;  Surgeon: Wellington Hampshire, MD;  Location: [place] [facility];  Service: Cardiovascular;  Laterality: Left;  Left superficial femoral   TEE WITHOUT CARDIOVERSION [name] 07/06/2016   Procedure: TRANSESOPHAGEAL ECHOCARDIOGRAM (TEE);  Surgeon: Ivin Poot, MD;  Location: [place];  Service: Open Heart Surgery;  Laterality: [name];   TONSILLECTOMY      Social History   Socioeconomic History   Marital status: Married    Spouse name: Not on file   Number of children: 1   Years of education: 14   Highest education level: Not on file  Occupational History   Occupation: Disability  Tobacco Use   Smoking status: Every Day    Packs/day: 0.50    Years: 27.00    Pack years: 13.50    Types: Cigarettes   Smokeless tobacco: Never  Vaping Use   Vaping Use: Former  Substance and Sexual Activity   Alcohol use: Not Currently    Alcohol/week: 0.0 standard drinks    Comment: socially   Drug use: No   Sexual activity: Yes    Partners: Male    Birth control/protection: None  Other Topics Concern   Not on file  Social History Narrative   11/21/20   From: [name]   Living: with husband, [name] (2009) and special needs daughter    Work: disability       Family: daughter - [name] (special needs, lives with her) and 2 grown step children - [name] and [name]       Enjoys: play with dogs - 2 german Shepard, mut, and blue tick walker mix      Exercise: PAD limits exercise   Diet: diabetic diet and low potassium due to daughter      Safety   Seat belts: Yes    Guns: Yes  and secure   Safe in relationships: Yes    Social Determinants of Health    Financial Resource Strain: Not on file  Food Insecurity: Not on file  Transportation Needs: Not on file  Physical Activity: Not on file  Stress: Not on file  Social Connections: Not on file  Intimate Partner Violence: Not on file    Current Outpatient Medications on File Prior to Visit  Medication Sig Dispense Refill   acetaminophen (TYLENOL) 325 MG tablet Take 1-2 tablets (325-650 mg total) by mouth every 4 (four) hours as needed for mild pain.     albuterol (PROVENTIL HFA;VENTOLIN HFA) 108 (90 Base) MCG/ACT inhaler Inhale 2 puffs into the lungs every 6 (six) hours as needed for wheezing. 1 Inhaler 0   aspirin 81 MG chewable tablet Chew 1 tablet (81 mg total) by mouth  daily.     benztropine (COGENTIN) 0.5 MG tablet Take 1 tablet (0.5 mg total) by mouth at bedtime. 90 tablet 0   chlorproMAZINE (THORAZINE) 100 MG tablet TAKE 1 TABLET BY MOUTH EVERYDAY AT BEDTIME 30 tablet 2   clonazePAM (KLONOPIN) 0.5 MG tablet Take 1 tablet (0.5 mg total) by mouth 2 (two) times daily. 60 tablet 2   clopidogrel (PLAVIX) 75 MG tablet Take 1 tablet (75 mg total) by mouth daily. 30 tablet 0   Continuous Blood Gluc Sensor (DEXCOM G6 SENSOR) MISC 1 Dose by Does not apply route daily. 30 each 3   Continuous Blood Gluc Transmit (DEXCOM G6 TRANSMITTER) MISC 1 Units by Does not apply route daily. 1 each 3   [START ON 08/29/2021] cyanocobalamin (,VITAMIN B-12,) 1000 MCG/ML injection Inject 1 mL (1,000 mcg total) into the skin every 30 (thirty) days. 1 mL 0   Dulaglutide (TRULICITY) A999333 0000000 SOPN Inject 0.75 mg into the skin once a week. 6 mL 3   ezetimibe (ZETIA) 10 MG tablet Take 1 tablet (10 mg total) by mouth daily. 30 tablet 0   FLUoxetine HCl 60 MG TABS Take 60 mg by mouth daily. 90 tablet 0   guaiFENesin (MUCINEX) 600 MG 12 hr tablet Take 1 tablet (600 mg total) by mouth 2 (two) times daily. 60 tablet 0   Incontinence Supply Disposable (DEPEND ADJUSTABLE UNDERWEAR LG) MISC 1 Units by Does not apply route  daily. 30 each 3   Insulin Syringe-Needle U-100 29G X 1/2" 0.3 ML MISC 1 each by Does not apply route 2 (two) times daily. 30 each 0   isosorbide mononitrate (IMDUR) 60 MG 24 hr tablet Take 1 tablet (60 mg total) by mouth 2 (two) times daily. 60 tablet 0   lamoTRIgine (LAMICTAL) 200 MG tablet Take 1 tablet (200 mg total) by mouth at bedtime. 90 tablet 0   naloxone (NARCAN) nasal spray 4 mg/0.1 mL Place 1 spray into the nose as needed for up to 365 doses (for opioid-induced respiratory depresssion). In case of emergency (overdose), spray once into each nostril. If no response within 3 minutes, repeat application and call [id number]. 1 each 0   nicotine (NICODERM CQ - DOSED IN MG/24 HOURS) 21 mg/24hr patch Place 1 patch (21 mg total) onto the skin daily. 30 patch 0   nitroGLYCERIN (NITROSTAT) 0.4 MG SL tablet Place 1 tablet (0.4 mg total) under the tongue every 5 (five) minutes as needed for chest pain. 25 tablet 1   ondansetron (ZOFRAN) 4 MG tablet Take 1 tablet (4 mg total) by mouth daily as needed for nausea or vomiting. 20 tablet 11   oxyCODONE (OXY IR/ROXICODONE) 5 MG immediate release tablet Take 1 tablet (5 mg total) by mouth daily as needed for severe pain. Must last 30 days. 30 tablet 0   pantoprazole (PROTONIX) 40 MG tablet Take 1 tablet (40 mg total) by mouth daily at 12 noon. 30 tablet 0   polyethylene glycol (MIRALAX / GLYCOLAX) 17 g packet Take 17 g by mouth 2 (two) times daily. 60 each 0   potassium chloride (KLOR-CON) 20 MEQ packet Take 20 mEq by mouth daily. 30 packet 0   pregabalin (LYRICA) 225 MG capsule Take 1 capsule (225 mg total) by mouth 2 (two) times daily. 180 capsule 1   rosuvastatin (CRESTOR) 20 MG tablet Take 1 tablet (20 mg total) by mouth daily. 30 tablet 0   senna-docusate (SENOKOT-S) 8.6-50 MG tablet Take 2 tablets by mouth 2 (two) times daily.  120 tablet 0   spironolactone (ALDACTONE) 25 MG tablet Take 1 tablet (25 mg total) by mouth daily. 30 tablet 0   torsemide (DEMADEX)  10 MG tablet Take 1 tablet (10 mg total) by mouth daily. Decreased from twice a day. 30 tablet 0   vitamin B-12 1000 MCG tablet Take 1 tablet (1,000 mcg total) by mouth daily.     Vitamin D, Ergocalciferol, (DRISDOL) 1.25 MG (50000 UNIT) CAPS capsule Take 1 capsule (50,000 Units total) by mouth every 7 (seven) days. 5 capsule 0   No current facility-administered medications on file prior to visit.    Allergies  Allergen Reactions   Chantix [Varenicline Tartrate] Other (See Comments)    Feels "crazy" and angry     Family History  Adopted: Yes  Problem Relation Age of Onset   Diabetes Mother    Diabetes Father    Alcohol abuse Father    Heart disease Father    Drug abuse Father    Stroke Sister    Anxiety disorder Sister     BP (!) 156/84 (BP Location: Right Arm, Patient Position: Sitting, Cuff Size: Normal)   Pulse 91   Ht '5\' 7"'$  (1.702 m)   Wt 204 lb 3.2 oz (92.6 kg)   SpO2 93%   BMI 31.98 kg/m    Review of Systems She denies hypoglycemia.  No weight change.      Objective:   Physical Exam VITAL SIGNS:  See vs page GENERAL: no distress.  In WC Pulses: dorsalis pedis intact bilat.   MSK: no deformity of the feet CV: no leg edema Skin:  no ulcer on the feet.  normal color and temp on the feet. Neuro: sensation is intact to touch on the feet.    Lab Results  Component Value Date   HGBA1C 7.4 (A) 08/16/2021       Assessment & Plan:  Insulin-requiring type 2 DM.  We discussed.  Plan is to stay off Poca for now.   Nausea: we discussed.  We decided to hold off on increasing Trulicity for now.  Patient Instructions  Your blood pressure is high today.  Please see your primary care provider soon, to have these rechecked.   Please continue the same 2 diabetes medications.   Please come back for a follow-up appointment in 3 months.  check your blood sugar twice a day.  vary the time of day when you check, between before the 3 meals, and at bedtime.  also check if  you have symptoms of your blood sugar being too high or too low.  please keep a record of the readings and bring it to your next appointment here (or you can bring the meter itself).  You can write it on any piece of paper.  please call us sooner if your blood sugar goes below 70, or if you have a lot of readings over 200.

## 2021-08-17 ENCOUNTER — Encounter: Payer: Self-pay | Admitting: Occupational Therapy

## 2021-08-17 ENCOUNTER — Ambulatory Visit: Payer: Managed Care, Other (non HMO) | Admitting: Occupational Therapy

## 2021-08-17 ENCOUNTER — Ambulatory Visit: Payer: Managed Care, Other (non HMO) | Admitting: Speech Pathology

## 2021-08-17 ENCOUNTER — Ambulatory Visit: Payer: Managed Care, Other (non HMO) | Admitting: Physical Therapy

## 2021-08-17 DIAGNOSIS — M6281 Muscle weakness (generalized): Secondary | ICD-10-CM

## 2021-08-17 DIAGNOSIS — R41841 Cognitive communication deficit: Secondary | ICD-10-CM | POA: Diagnosis not present

## 2021-08-17 DIAGNOSIS — R262 Difficulty in walking, not elsewhere classified: Secondary | ICD-10-CM

## 2021-08-17 DIAGNOSIS — R278 Other lack of coordination: Secondary | ICD-10-CM

## 2021-08-17 DIAGNOSIS — R269 Unspecified abnormalities of gait and mobility: Secondary | ICD-10-CM

## 2021-08-17 DIAGNOSIS — R471 Dysarthria and anarthria: Secondary | ICD-10-CM

## 2021-08-17 DIAGNOSIS — R2681 Unsteadiness on feet: Secondary | ICD-10-CM

## 2021-08-17 NOTE — Patient Instructions (Addendum)
Technology Applications  Constant Therapy: has free 2 week trial and then paid subscription.                UN: [email or web address] PW: therapy Visual Attention Therapy  TalkPath Therapy TalkPath News Matrix Game 3 (visual problem-solving game App)  Awesome Memory (short-term memory game App) Skill Game (executive functioning game App)  Buyer, retail (practice counting money App)  Curator (practice spaced retrieval App) Visual Schedule Planner  In Case of Emergency Apps Family Tracker GPS App  [email or web address]  This is a database with lots of apps for cognition listed by attention, memory, etc.

## 2021-08-17 NOTE — Therapy (Signed)
[place] MAIN [facility] SERVICES [address] [place], [place], [address] Phone: [phone]   Fax:  [phone]  Occupational Therapy Treatment  Patient Details  Name: Erika [name] MRN: [id number] Date of Birth: [birth year] Referring Provider (OT): Dr. Leeroy Cha   Encounter Date: 08/17/2021   OT End of Session - 08/17/21 1801     Visit Number 4    Number of Visits 20    Date for OT Re-Evaluation 10/25/21    Authorization Type limited to 20 visits combined/multidiscipline    Authorization Time Period Reporting period starting 08/03/2021    OT Start Time 1515    OT Stop Time 1600    OT Time Calculation (min) 45 min    Equipment Utilized During Treatment wc    Activity Tolerance Patient tolerated treatment well    Behavior During Therapy [facility] for tasks assessed/performed;Flat affect;Impulsive             Past Medical History:  Diagnosis Date   Arthralgia of temporomandibular joint    CAD, multiple vessel    a. 06/2016 Cath: ostLM 40%, ostLAD 40%, pLAD 95%, ost-pLCx 60%, pLCx 95%, mLCx 60%, mRCA 95%, D2 50%, LVSF nl;  b. 07/2016 CABG x 4 (LIMA->LAD, VG->Diag, VG->OM, VG->RCA); c. 08/2016 Cath: 3VD w/ 4/4 patent grafts. LAD distal to LIMA has diff dzs->Med rx; d. 08/2020 Cath: 4/4 patent grafts, native 3VD. EF 55-65%-->Med Rx.   Carotid arterial disease ([place])    a. 07/2016 s/p R CEA; b. 02/2021 U/S: RICA 123456, LICA 123456.   Clotting disorder ([place])    Depression    Diastolic dysfunction    a. 06/2016 Echo: EF 50-55%, mild inf wall HK, GR1DD, mild MR, RV sys fxn nl, mildly dilated LA, PASP nl   Fatty liver disease, nonalcoholic Q000111Q   History of blood transfusion    with heart surgery   HLD (hyperlipidemia)    Labile hypertension    a. prior renal ngiogram negative for RAS in 03/2016; b. catecholamines and metanephrines normal, mildly elevated renin with normal aldosterone and normal ratio in 02/2016   Myocardial infarction [facility])  2017   Obesity    PAD (peripheral artery disease) ([place])    a. 09/2018 s/p L SFA stenting; b. 07/2019 Periph Angio: Patent m/d L SFA stent w/ 100% L SFA distal to stent. L AT 100d, L Peroneal diff dzs-->Med Rx; c. 02/2021 ABIs: stable @ 0.61 on R and 0.46 on L.   PTSD (post-traumatic stress disorder)    Tobacco abuse    Type 2 diabetes mellitus ([place]) 12/2015    Past Surgical History:  Procedure Laterality Date   ABDOMINAL AORTOGRAM W/LOWER EXTREMITY [name] 10/15/2018   Procedure: ABDOMINAL AORTOGRAM W/LOWER EXTREMITY;  Surgeon: Wellington Hampshire, MD;  Location: [place] [facility];  Service: Cardiovascular;  Laterality: [name];   ABDOMINAL AORTOGRAM W/LOWER EXTREMITY Bilateral 08/19/2019   Procedure: ABDOMINAL AORTOGRAM W/LOWER EXTREMITY;  Surgeon: Wellington Hampshire, MD;  Location: [place] [facility];  Service: Cardiovascular;  Laterality: Bilateral;   CARDIAC CATHETERIZATION [name] 06/29/2016   Procedure: Left Heart Cath and Coronary Angiography;  Surgeon: Minna Merritts, MD;  Location: [place] [facility];  Service: Cardiovascular;  Laterality: [name];   CARDIAC CATHETERIZATION [name] 08/29/2016   Procedure: Left Heart Cath and Cors/Grafts Angiography;  Surgeon: Wellington Hampshire, MD;  Location: [place] [facility];  Service: Cardiovascular;  Laterality: [name];   CESAREAN SECTION     CHOLECYSTECTOMY     CORONARY ARTERY  BYPASS GRAFT [name] 07/06/2016   Procedure: CORONARY ARTERY BYPASS GRAFTING (CABG) x four, using left internal mammary artery and right leg greater saphenous vein harvested endoscopically;  Surgeon: Ivin Poot, MD;  Location: [place];  Service: Open Heart Surgery;  Laterality: [name];   ENDARTERECTOMY Right 07/06/2016   Procedure: ENDARTERECTOMY CAROTID;  Surgeon: Rosetta Posner, MD;  Location: [facility] OR;  Service: Vascular;  Laterality: Right;   ENDARTERECTOMY Right 04/27/2020   Procedure: REDO OF RIGHT ENDARTERECTOMY CAROTID;  Surgeon: Rosetta Posner, MD;  Location: [facility] OR;  Service: Vascular;  Laterality:  Right;   LEFT HEART CATH AND CORS/GRAFTS ANGIOGRAPHY [name] 08/24/2020   Procedure: LEFT HEART CATH AND CORS/GRAFTS ANGIOGRAPHY;  Surgeon: Wellington Hampshire, MD;  Location: [place] [facility];  Service: Cardiovascular;  Laterality: [name];   PERIPHERAL VASCULAR CATHETERIZATION [name] 04/18/2016   Procedure: Renal Angiography;  Surgeon: Wellington Hampshire, MD;  Location: [place] [facility];  Service: Cardiovascular;  Laterality: [name];   PERIPHERAL VASCULAR INTERVENTION Left 10/15/2018   Procedure: PERIPHERAL VASCULAR INTERVENTION;  Surgeon: Wellington Hampshire, MD;  Location: [place] [facility];  Service: Cardiovascular;  Laterality: Left;  Left superficial femoral   TEE WITHOUT CARDIOVERSION [name] 07/06/2016   Procedure: TRANSESOPHAGEAL ECHOCARDIOGRAM (TEE);  Surgeon: Ivin Poot, MD;  Location: [place];  Service: Open Heart Surgery;  Laterality: [name];   TONSILLECTOMY      There were no vitals filed for this visit.   Subjective Assessment - 08/17/21 1607     Subjective  Pt. reports having 8/10 cluster headache, and leg pain    Patient is accompanied by: Family member    Pertinent History HX of CAD, CABG, DM2, s/p R pontine stroke    Limitations L sided hemiplegia, impulsivity, judgment, safety awareness    Currently in Pain? Yes    Pain Score 8     Pain Orientation Right    Pain Descriptors / Indicators Aching    Pain Type Acute pain;Chronic pain    Pain Score 2    Pain Location Head    Pain Orientation Anterior    Pain Descriptors / Indicators Headache    Pain Type Chronic pain            OT TREATMENT    For muscular reeducation:  Patient performed weightbearing in multiple open chain positions with support approximately at the elbow.  Education was provided about opportunities for weightbearing through her left upper extremity and forearm.  Therapeutic Exercise:   Pt. tolerated PROM in all joint ranges for left shoulder flexion, extension, abduction, horizontal abduction, and adduction,  elbow flexion, extension, forearm supination, pronation, wrist extension, digit MP, PIP, and DIP flexion, and extension. Pt. Performed PROM for hand to face patterns as well as PNF for diagonal patterns in preparation for like grooming.   Manual Therapy:   Pt. tolerated scapular mobilizations for elevation, depression, abduction/rotation.  Pt. Tolerated soft tissue mobilizations for metacarpal spread stretches to the left hand.  Patient tolerated soft tissue massage to the left shoulder and scapular region. Manual therapy was performed independent of, and in preparation for ther. Ex.   Patient received her left arm trough with a w/c armrest wedge.  Patient is waiting to receive her e-stim unit from the vendor. Patient reports having changes with reductions in 2 pain medications. Pt. reports 8/10 left arm pain. Pt. tolerated ROM,weightbearing, PNF patterns and manual therapy well. Pt. Education was provided about finding opportunities to engage the LUE in proprioceptive input,  forearm weightbearing at a tabletop surface, and engaging it as a gross assist during daily activities. Pt. continues to work on improving and maximizing independence with ADLs, and IADL tasks.                                    OT Education - 08/17/21 1801     Education Details PROM to LUE, weightbearing    Person(s) Educated Patient;Spouse    Methods Explanation;Demonstration;Verbal cues    Comprehension Verbalized understanding;Returned demonstration;Need further instruction;Verbal cues required              OT Short Term Goals - 08/04/21 0914       OT SHORT TERM GOAL #1   Title Pt will be perform HEP for LUE with min vc.    Baseline Eval: Initiated at eval, further instruction needed    Time 6    Period Weeks    Status New    Target Date 09/13/21               OT Long Term Goals - 08/04/21 0915       OT LONG TERM GOAL #1   Title Pt will increase FOTO score to 40 or better  to indicate increased functional performance    Baseline Eval: 23    Time 12    Period Weeks    Status New    Target Date 10/25/21      OT LONG TERM GOAL #2   Title Pt will perform UB ADLs with set up    Baseline Eval: Mod A to perform UB ADLs    Time 12    Period Weeks    Status New    Target Date 10/25/21      OT LONG TERM GOAL #3   Title Pt will perform LB ADLs with min A    Baseline Eval: Max A to perform LB ADLs    Time 12    Period Weeks    Status New    Target Date 10/25/21      OT LONG TERM GOAL #4   Title Pt will perform ADL transfers with distant supv    Baseline Eval: min A-mod A to perform ADL transfers    Time 12    Period Weeks    Status New    Target Date 10/25/21      OT LONG TERM GOAL #5   Title Pt will increase LUE strength by 2 MM grades to work towards use of LUE as a stabilizer for self care.    Baseline Eval: L shoulder 1/5, elbow/wrist/hand 0/5.  Unable to use LUE as a stabilzer for self care.    Time 12    Period Weeks    Status New    Target Date 10/25/21                   Plan - 08/17/21 1802     Clinical Impression Statement Patient received her left arm trough with a w/c armrest wedge.  Patient is waiting to receive her e-stim unit from the vendor. Patient reports having changes with reductions in 2 pain medications. Pt. reports 8/10 left arm pain. Pt. tolerated ROM,weightbearing, PNF patterns and manual therapy well. Pt. Education was provided about finding opportunities to engage the LUE in proprioceptive input, forearm weightbearing at a tabletop surface, and engaging it as a gross assist during daily activities. Pt.  continues to work on improving and maximizing independence with ADLs, and IADL tasks.   OT Occupational Profile and History Detailed Assessment- Review of Records and additional review of physical, cognitive, psychosocial history related to current functional performance    Occupational performance deficits (Please  refer to evaluation for details): ADL's;IADL's;Leisure;Work    Marketing executive / Function / Physical Skills ADL;Continence;Dexterity;Strength;Vision;Balance;Coordination;FMC;IADL;Body mechanics;Endurance;Gait;UE functional use;Decreased knowledge of use of DME;GMC;Mobility    Rehab Potential Good    Clinical Decision Making Several treatment options, min-mod task modification necessary    Comorbidities Affecting Occupational Performance: May have comorbidities impacting occupational performance    Modification or Assistance to Complete Evaluation  Min-Moderate modification of tasks or assist with assess necessary to complete eval    OT Frequency 2x / week    OT Duration 12 weeks    OT Treatment/Interventions Self-care/ADL training;Therapeutic exercise;DME and/or AE instruction;Functional Mobility Training;Balance training;Electrical Stimulation;Neuromuscular education;Manual Therapy;Splinting;Visual/perceptual remediation/compensation;Moist Heat;Passive range of motion;Therapeutic activities;Patient/family education    Consulted and Agree with Plan of Care Patient;Family member/caregiver    Family Member Consulted spouse, [name]             Patient will benefit from skilled therapeutic intervention in order to improve the following deficits and impairments:   Body Structure / Function / Physical Skills: ADL, Continence, Dexterity, Strength, Vision, Balance, Coordination, [place], IADL, Body mechanics, Endurance, Gait, UE functional use, Decreased knowledge of use of DME, [place], Mobility       Visit Diagnosis: Muscle weakness (generalized)  Other lack of coordination    Problem List Patient Active Problem List   Diagnosis Date Noted   Cognitive communication deficit 08/10/2021   Urinary incontinence 08/10/2021   GERD (gastroesophageal reflux disease) 08/03/2021   Chronic post-traumatic stress disorder (PTSD)    Depression with anxiety    Right pontine stroke ([place]) 06/28/2021    Hemiplegia and hemiparesis following cerebral infarction affecting left non-dominant side ([place]) 06/24/2021   Chronic diastolic CHF (congestive heart failure) ([place]) 06/24/2021   Fall 06/24/2021   Abnormal LFTs 06/24/2021   Olecranon bursitis of left elbow 06/12/2021   Chronic hip pain (Bilateral) 06/09/2021   Chronic use of opiate for therapeutic purpose 05/09/2021   Greater trochanteric bursitis of hip (Left) 03/09/2021   Bursitis of hip (Left) Q000111Q   Uncomplicated opioid dependence ([place]) 02/20/2021   Acute conjunctivitis of left eye 01/02/2021   Unintentional weight loss 11/21/2020   Broken teeth (Right) 08/02/2020   History of MI (myocardial infarction) (July 2017) 08/02/2020   Neuropathy 06/09/2020   Dental abscess 06/09/2020   Foot drop (Left) 06/09/2020   Carotid stenosis, asymptomatic, right 04/27/2020   Chronic migraine 11/03/2019   Thrombocytosis 09/16/2019   Erythrocytosis 09/16/2019   Hypercalcemia 09/06/2019   Leukocytosis 09/06/2019   Polycythemia 09/06/2019   Chronic hip pain (Left) 08/27/2019   Osteoarthritis of hip (Left) 08/27/2019   Gluteal tendonitis of buttock (Left) 08/27/2019   Radial nerve palsy (Right) 07/15/2019   Neuropathy of radial nerve (Right) 06/30/2019   Abnormal bruising 06/25/2019   History of carotid endarterectomy (Right) 03/04/2019   Pain medication agreement signed 03/04/2019   Atypical facial pain (Right) 01/27/2019   Chronic ear pain (Right) 01/27/2019   Chronic jaw pain (Right) 01/27/2019   Geniculate Neuralgia (Right) 01/27/2019   Vitamin D deficiency 01/19/2019   Neurogenic pain 01/19/2019   Chronic anticoagulation (PLAVIX) 01/19/2019   Chronic pain syndrome 01/07/2019   Long term current use of opiate analgesic 01/07/2019   Long term prescription benzodiazepine use 01/07/2019  Pharmacologic therapy 01/07/2019   Disorder of skeletal system 01/07/2019   Problems influencing health status 01/07/2019   Chronic headaches (1ry  area of Pain) (Right) 01/07/2019   Opiate use 09/24/2018   PVD (peripheral vascular disease) ([place]) 06/24/2018   Chronic ankle pain (Bilateral) 12/27/2017   Tendinopathy of gluteus medius (Right) 12/27/2017   Tendinopathy of gluteus medius (Left) 12/27/2017   Bilateral hip pain 12/26/2017   Chronic elbow pain (Left) 10/17/2017   Elevated troponin I level 05/21/2017   Major depressive disorder, recurrent episode, moderate ([place]) 11/19/2016   Insomnia 10/30/2016   Constipation 07/25/2016   S/P CABG x 4 07/06/2016   Bradycardia    CAD (coronary artery disease)    Carotid stenosis    CAD in native artery 06/29/2016   Elevated troponin 06/28/2016   Essential hypertension, malignant 06/28/2016   Mild tobacco abuse in early remission 06/28/2016   Essential hypertension    Malignant hypertension    Type 2 diabetes mellitus with other specified complication (HCC)    Chest pain with high risk for cardiac etiology 06/27/2016   NSTEMI (non-ST elevated myocardial infarction) ([place]) 06/27/2016   Proteinuria 03/15/2016   Renal artery stenosis ([place]) 03/15/2016   MDD (major depressive disorder) 10/17/2015   Agoraphobia with panic attacks 04/25/2015   HTN (hypertension), malignant 10/20/2013   Cluster headache 03/20/2012   HLD (hyperlipidemia) 07/26/2010   ADJUSTMENT DISORDER WITH MIXED FEATURES 07/26/2010    Harrel Carina, MS, OTR/L 08/17/2021, 6:04 PM  [place] MAIN [facility] SERVICES [place], [place], [address] Phone: [phone]   Fax:  [phone]  Name: Erika [name] MRN: [id number] Date of Birth: 1971-07-30

## 2021-08-17 NOTE — Telephone Encounter (Signed)
Called the pharmacy stated if can change lantus to levemir due to  insurance not paying for the name brand and shortage. Please advise

## 2021-08-17 NOTE — Therapy (Signed)
[place] MAIN [facility] SERVICES [address] [place], [place], [address] Phone: [phone]   Fax:  [phone]  Physical Therapy Treatment  Patient Details  Name: Erika [name] MRN: [id number] Date of Birth: 1971/11/25 Referring Provider (PT): Leeroy Cha MD   Encounter Date: 08/17/2021   PT End of Session - 08/17/21 1723     Visit Number 4    Number of Visits 20    Date for PT Re-Evaluation 09/14/21    Authorization Type [employer] max visits 20x per year speech, OT, PT    Authorization Time Period 08/03/21-10/26/21    Progress Note Due on Visit 10    PT Start Time 1430    PT Stop Time 1516    PT Time Calculation (min) 46 min    Equipment Utilized During Treatment Gait belt;Other (comment)   L AFO   Activity Tolerance Patient tolerated treatment well    Behavior During Therapy [facility] for tasks assessed/performed;Flat affect;Impulsive             Past Medical History:  Diagnosis Date   Arthralgia of temporomandibular joint    CAD, multiple vessel    a. 06/2016 Cath: ostLM 40%, ostLAD 40%, pLAD 95%, ost-pLCx 60%, pLCx 95%, mLCx 60%, mRCA 95%, D2 50%, LVSF nl;  b. 07/2016 CABG x 4 (LIMA->LAD, VG->Diag, VG->OM, VG->RCA); c. 08/2016 Cath: 3VD w/ 4/4 patent grafts. LAD distal to LIMA has diff dzs->Med rx; d. 08/2020 Cath: 4/4 patent grafts, native 3VD. EF 55-65%-->Med Rx.   Carotid arterial disease ([place])    a. 07/2016 s/p R CEA; b. 02/2021 U/S: RICA 123456, LICA 123456.   Clotting disorder ([place])    Depression    Diastolic dysfunction    a. 06/2016 Echo: EF 50-55%, mild inf wall HK, GR1DD, mild MR, RV sys fxn nl, mildly dilated LA, PASP nl   Fatty liver disease, nonalcoholic [id number]   History of blood transfusion    with heart surgery   HLD (hyperlipidemia)    Labile hypertension    a. prior renal ngiogram negative for RAS in 03/2016; b. catecholamines and metanephrines normal, mildly elevated renin with normal aldosterone and normal ratio in  02/2016   Myocardial infarction [facility]) 2017   Obesity    PAD (peripheral artery disease) ([place])    a. 09/2018 s/p L SFA stenting; b. 07/2019 Periph Angio: Patent m/d L SFA stent w/ 100% L SFA distal to stent. L AT 100d, L Peroneal diff dzs-->Med Rx; c. 02/2021 ABIs: stable @ 0.61 on R and 0.46 on L.   PTSD (post-traumatic stress disorder)    Tobacco abuse    Type 2 diabetes mellitus ([place]) 12/2015    Past Surgical History:  Procedure Laterality Date   ABDOMINAL AORTOGRAM W/LOWER EXTREMITY [name] 10/15/2018   Procedure: ABDOMINAL AORTOGRAM W/LOWER EXTREMITY;  Surgeon: Wellington Hampshire, MD;  Location: [place] CV LAB;  Service: Cardiovascular;  Laterality: [name];   ABDOMINAL AORTOGRAM W/LOWER EXTREMITY Bilateral 08/19/2019   Procedure: ABDOMINAL AORTOGRAM W/LOWER EXTREMITY;  Surgeon: Wellington Hampshire, MD;  Location: [place] CV LAB;  Service: Cardiovascular;  Laterality: Bilateral;   CARDIAC CATHETERIZATION [name] 06/29/2016   Procedure: Left Heart Cath and Coronary Angiography;  Surgeon: Minna Merritts, MD;  Location: [place] CV LAB;  Service: Cardiovascular;  Laterality: [name];   CARDIAC CATHETERIZATION [name] 08/29/2016   Procedure: Left Heart Cath and Cors/Grafts Angiography;  Surgeon: Wellington Hampshire, MD;  Location: [place] CV LAB;  Service: Cardiovascular;  Laterality: [name];  CESAREAN SECTION     CHOLECYSTECTOMY     CORONARY ARTERY BYPASS GRAFT [name] 07/06/2016   Procedure: CORONARY ARTERY BYPASS GRAFTING (CABG) x four, using left internal mammary artery and right leg greater saphenous vein harvested endoscopically;  Surgeon: Ivin Poot, MD;  Location: [place];  Service: Open Heart Surgery;  Laterality: [name];   ENDARTERECTOMY Right 07/06/2016   Procedure: ENDARTERECTOMY CAROTID;  Surgeon: Rosetta Posner, MD;  Location: [facility] OR;  Service: Vascular;  Laterality: Right;   ENDARTERECTOMY Right 04/27/2020   Procedure: REDO OF RIGHT ENDARTERECTOMY CAROTID;  Surgeon: Rosetta Posner, MD;  Location: [facility] OR;   Service: Vascular;  Laterality: Right;   LEFT HEART CATH AND CORS/GRAFTS ANGIOGRAPHY [name] 08/24/2020   Procedure: LEFT HEART CATH AND CORS/GRAFTS ANGIOGRAPHY;  Surgeon: Wellington Hampshire, MD;  Location: [place] CV LAB;  Service: Cardiovascular;  Laterality: [name];   PERIPHERAL VASCULAR CATHETERIZATION [name] 04/18/2016   Procedure: Renal Angiography;  Surgeon: Wellington Hampshire, MD;  Location: [place] CV LAB;  Service: Cardiovascular;  Laterality: [name];   PERIPHERAL VASCULAR INTERVENTION Left 10/15/2018   Procedure: PERIPHERAL VASCULAR INTERVENTION;  Surgeon: Wellington Hampshire, MD;  Location: [place] CV LAB;  Service: Cardiovascular;  Laterality: Left;  Left superficial femoral   TEE WITHOUT CARDIOVERSION [name] 07/06/2016   Procedure: TRANSESOPHAGEAL ECHOCARDIOGRAM (TEE);  Surgeon: Ivin Poot, MD;  Location: [place];  Service: Open Heart Surgery;  Laterality: [name];   TONSILLECTOMY      There were no vitals filed for this visit.   Subjective Assessment - 08/17/21 1502     Subjective Pt reports a fall on Tuesday of this week when her mother in law was assisting her with a transfer. Pt reports no injury due to the fall but her husband had to rush home from work because her mother in law was not able to provide enough assistance with getting her off of the floor.    Pertinent History Pt presented to [facility] c c/c of slurred speech, L facial droop & LUE weakness. MRI revealed acute R pontine perforator infarct with chronic R parietal & occipital infarcts. PMH: HTN, HLD, DM, tobacco abuse, PAD, CAD, CABG, dCHF, peptic foot drop, renal artery stenosis, MI and CAD (s/p of R CEA 2017), PTSD, adjustment disorder, obesity, Left hip OA s/p OPPT 2020. Pt DC 06/28/21 to inpatient rehab in [place], [place] to home 08/01/21. AT CIR pt worked extensively on STS with MinA or Supervision and HW, Stand and squat pivot transfers with MinA, AMB progressed to ~32f max, mostly with HW (some RW use with fixed LUE on  handle), but was mostly limited in further progression of distance 2/2 chronic PAD ischaemic pain or Left knee buckling despite AFO use. CIR recommending update to more supporting AFO at transfer to OPPT. Since home, pt is using WC to navigate the environment, family assists with ADL performance, WC still needs to be modified to fit through BR doorframe. Pt reports not feeling safe with HW at present. Husband has been helping with gnelte stretching in mornings as pt sleeps in a flexion synergy posture on LLE.    Limitations Walking;Standing    How long can you sit comfortably? Generally well tolerated with occasional buttocks soreness    How long can you stand comfortably? At eval: tolerates <2 minutes prior to ischemic leg pain    How long can you walk comfortably? Longest distance at CIR last month ~264f    Currently in Pain? Yes  Pain Score 8     Pain Location Leg    Pain Orientation Right    Pain Descriptors / Indicators Aching    Pain Type Chronic pain    Pain Onset More than a month ago    Pain Frequency Constant              nterventions:  Patient's husband donned AFO with ant support (supplied by PT from closet)  as this improved pt c/o knee buckling and instability.    Gait training:  -Ambulation with hemi walker, Min A from therapist to prevent loss of balance and with WC follow via her husband.              -Pt required frequent VC, TC in order to facilitate proper placement of hemi walker to both allow larger step length as well as to prevent her R LE from catching on the leg of hemiwalker during swing phase of gait.  Ambulation distances as follows, between each set was seated rest break and transition to/from sitting with VC cues for proper sequencing 20 ft 30  ft 25 ft 30 ft Pt able to independentl;y maintain enough space for hemiwalker, pt and author to get through doorway with VC indicating improved awareness compared to last session when she displayed min r sided  neglect when turning corners.  73 ft - Min LOB during this bout but pt was able to recover with min A from Chief Strategy Officer              - Therapeutic Activities:  LAQ: x 10 with mod/min A left LE  for full range and cues for controlling eccentric portion of movement-  Heel slides seated with mod/max assist for knee flexion portion and independent on knee extension portion. X 5 to facilitate proper foot placement when transitioning from sit to stand STS x 3 with cues for proper sequencing of activities and to improve safety with performance at home                          PT Education - 08/17/21 1723     Education Details Pt instructed to make sure she takes the time to process her transitions and transfers prior to moving    Person(s) Educated Patient    Methods Explanation;Demonstration    Comprehension Verbalized understanding              PT Short Term Goals - 08/03/21 1645       PT SHORT TERM GOAL #1   Title Pt to demonstrate improved tolerance to standing >3 minutes without physical assist or LOB. DME ad lib    Baseline <2 minutes at eval    Time 4    Period Weeks    Status New    Target Date 08/31/21      PT SHORT TERM GOAL #2   Title Pt to demonstrate perfromance of 5xSTS c AFO/Hemi walker at supervision level independence without LOB or physical assist.    Baseline Needs minGuard to MinA to perform at eval    Time 4    Period Weeks    Status New    Target Date 08/31/21      PT SHORT TERM GOAL #3   Title Pt to demonstrate AMB ~15f at supervision level with AFO, HW without LOB.    Baseline At eval limited by [facility]orporatedtechnique and AFO.    Time 6    Period Weeks  Status New    Target Date 09/14/21      PT SHORT TERM GOAL #4   Title Pt to demonstrate TUG test <120sec c HW and AFO at minguard - MinA level to improve independence with transfers and reduce falls risk at home.    Baseline No tug performed at eval:    Time 7    Period Weeks    Status  New    Target Date 09/21/21               PT Long Term Goals - 08/03/21 1647       PT LONG TERM GOAL #1   Title Pt to demonstrate perfromance of 5xSTS c AFO/Hemi walker at supervision level independence without LOB or physical assist in less than 20sec.    Baseline Unable at eval, requires minguard to minA    Time 8    Period Weeks    Status New    Target Date 09/28/21      PT LONG TERM GOAL #2   Title Pt to demonstrate FOTO score improvement >10 points to demonstrate improved perception of ability in ADL    Baseline 23 at eval:    Time 8    Period Weeks    Status New    Target Date 09/28/21      PT LONG TERM GOAL #3   Title Pt able to demonstrate stand pivot transfer with HW and AFO without LOB at supervision level to modI.    Baseline Needs minGuard to MinA for safety    Time 8    Period Weeks    Status New    Target Date 09/28/21      PT LONG TERM GOAL #4   Title Pt to demonstrate TUG test <60sec c HW and AFO at supervision level to improve independence with transfers and reduce falls risk at home.    Baseline No Tug at [facility]    Time 12    Period Weeks    Status New    Target Date 10/26/21                   Plan - 08/17/21 1725     Clinical Impression Statement Patient demonstrated improved overall gait distance today but continues to require constant VC and min assist to ambulate safely. She did not have any significant knee buckling and AFO working well.Pt required several breaks due to LE pain on the right side but was able to ambulate good distance in therapy todayfor a total of approximately 125 feet.  She presents with good motivation and great caregiver support and responded well to all cues today. Pt will benefit from skilled PT evaluation to maximize safety, independence, and tolerance to basic moblity required for ADL performance and accessing the community and to reduce risk of falls in the home.    Personal Factors and Comorbidities  Age;Comorbidity 3+;Fitness;Past/Current Experience;Time since onset of injury/illness/exacerbation    Comorbidities DM, HTN, PVD, clotting disorder, CAD    Examination-Activity Limitations Squat;Stairs;Bathing;Bed Mobility;Dressing;Reach Overhead;Hygiene/Grooming;Bend;Carry;Locomotion Level;Transfers;Toileting;Stand    Examination-Participation Restrictions Cleaning;Community Activity;Occupation;Laundry;Driving;Yard Work    Merchant [employer] officer Evolving/Moderate complexity    Clinical Decision Making Moderate    Rehab Potential Good    PT Frequency 2x / week    PT Duration 12 weeks    PT Treatment/Interventions Electrical Stimulation;Gait training;Stair training;Functional mobility training;Therapeutic activities;Therapeutic exercise;Balance training;Neuromuscular re-education;Patient/family education;Manual techniques;Wheelchair mobility training;Energy conservation;Passive range of motion;Orthotic Fit/Training;DME Instruction;ADLs/Self Care Home Management    PT Next  Visit Plan work toward supervision level STS and SPT c HW, sustained standing tolerance.    PT Home Exercise Plan Not updated this visit- Encouraged seated LE strengthening and practicing gait with husband.    Consulted and Agree with Plan of Care Patient;Family member/caregiver    Family Member Consulted husband             Patient will benefit from skilled therapeutic intervention in order to improve the following deficits and impairments:  Decreased endurance, Decreased mobility, Difficulty walking, Hypomobility, Decreased balance, Abnormal gait, Increased muscle spasms, Cardiopulmonary status limiting activity, Impaired flexibility, Hypermobility, Decreased strength, Decreased activity tolerance  Visit Diagnosis: Abnormality of gait and mobility  Difficulty in walking, not elsewhere classified  Unsteadiness on feet  Muscle weakness (generalized)     Problem List Patient Active Problem List    Diagnosis Date Noted   Cognitive communication deficit 08/10/2021   Urinary incontinence 08/10/2021   GERD (gastroesophageal reflux disease) 08/03/2021   Chronic post-traumatic stress disorder (PTSD)    Depression with anxiety    Right pontine stroke ([place]) 06/28/2021   Hemiplegia and hemiparesis following cerebral infarction affecting left non-dominant side ([place]) 06/24/2021   Chronic diastolic CHF (congestive heart failure) ([place]) 06/24/2021   Fall 06/24/2021   Abnormal LFTs 06/24/2021   Olecranon bursitis of left elbow 06/12/2021   Chronic hip pain (Bilateral) 06/09/2021   Chronic use of opiate for therapeutic purpose 05/09/2021   Greater trochanteric bursitis of hip (Left) 03/09/2021   Bursitis of hip (Left) [id number]   Uncomplicated opioid dependence ([place]) 02/20/2021   Acute conjunctivitis of left eye 01/02/2021   Unintentional weight loss 11/21/2020   Broken teeth (Right) 08/02/2020   History of MI (myocardial infarction) (July 2017) 08/02/2020   Neuropathy 06/09/2020   Dental abscess 06/09/2020   Foot drop (Left) 06/09/2020   Carotid stenosis, asymptomatic, right 04/27/2020   Chronic migraine 11/03/2019   Thrombocytosis 09/16/2019   Erythrocytosis 09/16/2019   Hypercalcemia 09/06/2019   Leukocytosis 09/06/2019   Polycythemia 09/06/2019   Chronic hip pain (Left) 08/27/2019   Osteoarthritis of hip (Left) 08/27/2019   Gluteal tendonitis of buttock (Left) 08/27/2019   Radial nerve palsy (Right) 07/15/2019   Neuropathy of radial nerve (Right) 06/30/2019   Abnormal bruising 06/25/2019   History of carotid endarterectomy (Right) 03/04/2019   Pain medication agreement signed 03/04/2019   Atypical facial pain (Right) 01/27/2019   Chronic ear pain (Right) 01/27/2019   Chronic jaw pain (Right) 01/27/2019   Geniculate Neuralgia (Right) 01/27/2019   Vitamin D deficiency 01/19/2019   Neurogenic pain 01/19/2019   Chronic anticoagulation (PLAVIX) 01/19/2019   Chronic pain syndrome  01/07/2019   Long term current use of opiate analgesic 01/07/2019   Long term prescription benzodiazepine use 01/07/2019   Pharmacologic therapy 01/07/2019   Disorder of skeletal system 01/07/2019   Problems influencing health status 01/07/2019   Chronic headaches (1ry area of Pain) (Right) 01/07/2019   Opiate use 09/24/2018   PVD (peripheral vascular disease) ([place]) 06/24/2018   Chronic ankle pain (Bilateral) 12/27/2017   Tendinopathy of gluteus medius (Right) 12/27/2017   Tendinopathy of gluteus medius (Left) 12/27/2017   Bilateral hip pain 12/26/2017   Chronic elbow pain (Left) 10/17/2017   Elevated troponin I level 05/21/2017   Major depressive disorder, recurrent episode, moderate (HCC) 11/19/2016   Insomnia 10/30/2016   Constipation 07/25/2016   S/P CABG x 4 07/06/2016   Bradycardia    CAD (coronary artery disease)    Carotid stenosis    CAD in  native artery 06/29/2016   Elevated troponin 06/28/2016   Essential hypertension, malignant 06/28/2016   Mild tobacco abuse in early remission 06/28/2016   Essential hypertension    Malignant hypertension    Type 2 diabetes mellitus with other specified complication [facility])    Chest pain with high risk for cardiac etiology 06/27/2016   NSTEMI (non-ST elevated myocardial infarction) ([place]) 06/27/2016   Proteinuria 03/15/2016   Renal artery stenosis ([place]) 03/15/2016   MDD (major depressive disorder) 10/17/2015   Agoraphobia with panic attacks 04/25/2015   HTN (hypertension), malignant 10/20/2013   Cluster headache 03/20/2012   HLD (hyperlipidemia) 07/26/2010   ADJUSTMENT DISORDER WITH MIXED FEATURES 07/26/2010   Rivka Barbara PT, DPT   Particia Lather 08/17/2021, 5:29 PM  [place] MAIN [facility] SERVICES [address] [place], [place], [address] Phone: [phone]   Fax:  [phone]  Name: DONYE ELDREDGE MRN: [id number] Date of Birth: May 04, 1971

## 2021-08-17 NOTE — Therapy (Signed)
[name] [address] [place], [place], [address] Phone: [phone]   Fax:  [phone]  Speech Language Pathology Treatment  Patient Details  Name: Erika [name] MRN: [id number] Date of Birth: 06-17-1971 Referring Provider (SLP): Erika Patrick Clide Deutscher, MD   Encounter Date: 08/17/2021   End of Session - 08/17/21 1736     Visit Number 3    Number of Visits 20    Date for SLP Re-Evaluation 11/02/21    Authorization Type [employer] - 20 visits per calendar year for OT, PT, and ST combined. counts as 1 visit if same day    Authorization - Visit Number 3    Authorization - Number of Visits 20    Progress Note Due on Visit 10    SLP Start Time 1600    SLP Stop Time  1700    SLP Time Calculation (min) 60 min    Activity Tolerance Patient tolerated treatment well             Past Medical History:  Diagnosis Date   Arthralgia of temporomandibular joint    CAD, multiple vessel    a. 06/2016 Cath: ostLM 40%, ostLAD 40%, pLAD 95%, ost-pLCx 60%, pLCx 95%, mLCx 60%, mRCA 95%, D2 50%, LVSF nl;  b. 07/2016 CABG x 4 (LIMA->LAD, VG->Diag, VG->OM, VG->RCA); c. 08/2016 Cath: 3VD w/ 4/4 patent grafts. LAD distal to LIMA has diff dzs->Med rx; d. 08/2020 Cath: 4/4 patent grafts, native 3VD. EF 55-65%-->Med Rx.   Carotid arterial disease ([place])    a. 07/2016 s/p R CEA; b. 02/2021 U/S: RICA 123456, LICA 123456.   Clotting disorder ([place])    Depression    Diastolic dysfunction    a. 06/2016 Echo: EF 50-55%, mild inf wall HK, GR1DD, mild MR, RV sys fxn nl, mildly dilated LA, PASP nl   Fatty liver disease, nonalcoholic Q000111Q   History of blood transfusion    with heart surgery   HLD (hyperlipidemia)    Labile hypertension    a. prior renal ngiogram negative for RAS in 03/2016; b. catecholamines and metanephrines normal, mildly elevated renin with normal aldosterone and normal ratio in 02/2016   Myocardial infarction [facility]) 2017   Obesity    PAD  (peripheral artery disease) ([place])    a. 09/2018 s/p L SFA stenting; b. 07/2019 Periph Angio: Patent m/d L SFA stent w/ 100% L SFA distal to stent. L AT 100d, L Peroneal diff dzs-->Med Rx; c. 02/2021 ABIs: stable @ 0.61 on R and 0.46 on L.   PTSD (post-traumatic stress disorder)    Tobacco abuse    Type 2 diabetes mellitus ([place]) 12/2015    Past Surgical History:  Procedure Laterality Date   ABDOMINAL AORTOGRAM W/LOWER EXTREMITY [name] 10/15/2018   Procedure: ABDOMINAL AORTOGRAM W/LOWER EXTREMITY;  Surgeon: Erika Hampshire, MD;  Location: [place] [facility];  Service: Cardiovascular;  Laterality: [name];   ABDOMINAL AORTOGRAM W/LOWER EXTREMITY Bilateral 08/19/2019   Procedure: ABDOMINAL AORTOGRAM W/LOWER EXTREMITY;  Surgeon: Erika Hampshire, MD;  Location: [place] [facility];  Service: Cardiovascular;  Laterality: Bilateral;   CARDIAC CATHETERIZATION [name] 06/29/2016   Procedure: Left Heart Cath and Coronary Angiography;  Surgeon: Erika Merritts, MD;  Location: [place] [facility];  Service: Cardiovascular;  Laterality: [name];   CARDIAC CATHETERIZATION [name] 08/29/2016   Procedure: Left Heart Cath and Cors/Grafts Angiography;  Surgeon: Erika Hampshire, MD;  Location: [place] [facility];  Service: Cardiovascular;  Laterality: [name];  CESAREAN SECTION     CHOLECYSTECTOMY     CORONARY ARTERY BYPASS GRAFT [name] 07/06/2016   Procedure: CORONARY ARTERY BYPASS GRAFTING (CABG) x four, using left internal mammary artery and right leg greater saphenous vein harvested endoscopically;  Surgeon: Erika Poot, MD;  Location: [place];  Service: Open Heart Surgery;  Laterality: [name];   ENDARTERECTOMY Right 07/06/2016   Procedure: ENDARTERECTOMY CAROTID;  Surgeon: Erika Posner, MD;  [facility] OR;  Service: Vascular;  Laterality: Right;   ENDARTERECTOMY Right 04/27/2020   Procedure: REDO OF RIGHT ENDARTERECTOMY CAROTID;  Surgeon: Erika Posner, MD;  Location: [facility];  Service: Vascular;  Laterality: Right;   LEFT HEART CATH  AND CORS/GRAFTS ANGIOGRAPHY [name] 08/24/2020   Procedure: LEFT HEART CATH AND CORS/GRAFTS ANGIOGRAPHY;  Surgeon: Erika Hampshire, MD;  Location: [place] [facility];  Service: Cardiovascular;  Laterality: [name];   PERIPHERAL VASCULAR CATHETERIZATION [name] 04/18/2016   Procedure: Renal Angiography;  Surgeon: Erika Hampshire, MD;  Location: [place] [facility];  Service: Cardiovascular;  Laterality: [name];   PERIPHERAL VASCULAR INTERVENTION Left 10/15/2018   Procedure: PERIPHERAL VASCULAR INTERVENTION;  Surgeon: Erika Hampshire, MD;  Location: [place] [facility];  Service: Cardiovascular;  Laterality: Left;  Left superficial femoral   TEE WITHOUT CARDIOVERSION [name] 07/06/2016   Procedure: TRANSESOPHAGEAL ECHOCARDIOGRAM (TEE);  Surgeon: Erika Poot, MD;  Location: [place];  Service: Open Heart Surgery;  Laterality: [name];   TONSILLECTOMY      There were no vitals filed for this visit.   Subjective Assessment - 08/17/21 1728     Subjective Patient had choking episode last night, and husband performed Heimlich    Currently in Pain? Yes    Pain Score 6     Pain Location Leg                   ADULT SLP TREATMENT - 08/17/21 1728       General Information   Behavior/Cognition Alert;Cooperative    HPI Erika [name] is a 50 y.o. female admitted [facility]/24/21 with slurred speech, left facial droop and LUE weakness; MRI showed acute R pontine CVA and chronic R parietal and occipital infarcts. Past medical history is noted for HTN, HLD, DM II, PAD, MI, CAD, s/p CABG, CHF, PTSD, adjustment disorder, obesity. Patient worked with [place], [place], [place] in [place] 06/28/21-08/01/21. SLP targeted cognitive deficits and dysarthria, as well as dysphagia; upgraded to regular/thin by time of d/c home.      Treatment Provided   Treatment provided Cognitive-Linquistic      Pain Assessment   Pain Assessment No/denies pain      Cognitive-Linquistic Treatment   Treatment focused on Cognition;Patient/family/caregiver  education    Skilled Treatment Husband reports patient choked on spaghetti last night due to taking a large bite and talking at the same time.  Verbalizes understanding that patient foods should be cut and that she should have minimal distractions and supervision at mealtimes.  Patient verbalized frustration with not being more independent, however did acknowledge that she puts herself at risk by attempting things she is not ready for at this time.  Suggested having patient verbalize the steps for safety/readiness for tasks where she is impulsive.  Targeted impulsivity and attention with alternating symbols and word lists.  Patient accuracy 90%, and she required min to mod cues for impulsivity.  Throughout session focused on using strategy of verbalization to increase attention and short-term recall.  Picture matching for 6 pairs of  images 93% accuracy with usual moderate cues to use strategy and reduce impulsivity.      Assessment / Recommendations / Plan   Plan Continue with current plan of care      Progression Toward Goals   Progression toward goals Progressing toward goals              SLP Education - 08/17/21 1735     Education Details Apps for cognition    Person(s) Educated Patient;Spouse    Methods Explanation;Handout    Comprehension Verbalized understanding;Need further instruction              SLP Short Term Goals - 08/04/21 1246       SLP SHORT TERM GOAL #1   Title Pt will verbalize awareness of impulsivity in simple tasks >80% of the time with min cues.    Time 10    Period --   sessions   Status New      SLP SHORT TERM GOAL #2   Title Pt will ID out-of-turn responses >80% with non-verbal cues.    Time 10    Period --   sessions   Status New      SLP SHORT TERM GOAL #3   Title Pt will verbalize steps to safety for transfers/gait training to reduce impulsive/unsafe movement.    Time 10    Period --   sessions   Status New      SLP SHORT TERM GOAL #4    Title Pt will verbalize daily schedule, recent events and salient information using external memory aids.    Time 10    Period --   sessions   Status New      SLP SHORT TERM GOAL #5   Title Generate written or verbal cues to improve accuracy of 3 house hold tasks or safety.    Time 10    Period --   sessions   Status New              SLP Long Term Goals - 08/04/21 1334       SLP LONG TERM GOAL #1   Title Pt will verbalize or write steps prior to task initiation >80% with modified independence.    Time 12    Period Weeks    Status New    Target Date 11/01/21      SLP LONG TERM GOAL #2   Title Pt will demonstrate appropriate turn-taking >90% of the time in 15 minutes mod complex conversation.    Time 12    Period Weeks    Status New    Target Date 11/01/21      SLP LONG TERM GOAL #3   Title Pt will use external aids to organize/recall appointments and medical information for herself and her daughter.    Time 12    Period Weeks    Status New    Target Date 11/01/21      SLP LONG TERM GOAL #4   Title Pt will plan, organize and present a 20-30 minute lesson on personal or gun safety using external aids/compensations    Time 12    Period Weeks    Status New    Target Date 11/01/21      SLP LONG TERM GOAL #5   Title Patient will use compensations for dysarthria in 20 minute mod complex conversation outside of [facility] room for >95% intelligibility.    Time 12    Period Weeks    Status New  Target Date 11/01/21              Plan - 08/17/21 1736     Clinical Impression Statement Erika [name] presents with overall mild-moderate cognitive communication deficits with impairments in attention, memory, problem solving, language, and executive function. She also has mild dysarthria characterized by imprecise articulation impacting intelligibility at conversation level.  Patient's diet was advanced prior to leaving CIR, however she had choking episode last night.   Appears to be due to cognitive deficits including impulsivity and distraction (talking with mouthful).  Reinforced aspiration precautions and recommended cutting patient's food and ensuring she has supervision at mealtimes.  Patient remains impulsive however makes attempts to reduce this after her attention is drawn to this.  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 Recommend skilled ST to maximize cognitive and communication abilities to increase safety, independence, and life participation.    Speech Therapy Frequency 2x / week    Duration 12 weeks   20 visits allowed per calendar year per ins   Treatment/Interventions Language facilitation;Environmental controls;Cueing hierarchy;SLP instruction and feedback;Compensatory techniques;Cognitive reorganization;Functional tasks;Compensatory strategies;Internal/external aids;Multimodal communcation approach;Patient/family education    Potential to Achieve Goals Good    SLP Home Exercise Plan notebook/binder for therapies    Consulted and Agree with Plan of Care Patient;Family member/caregiver             Patient will benefit from skilled therapeutic intervention in order to improve the following deficits and impairments:   Cognitive communication deficit  Dysarthria and anarthria    Problem List Patient Active Problem List   Diagnosis Date Noted   Cognitive communication deficit 08/10/2021   Urinary incontinence 08/10/2021   GERD (gastroesophageal reflux disease) 08/03/2021   Chronic post-traumatic stress disorder (PTSD)    Depression with anxiety    Right pontine stroke ([place]) 06/28/2021   Hemiplegia and hemiparesis following cerebral infarction affecting left non-dominant side  ([place]) 06/24/2021   Chronic diastolic CHF (congestive heart failure) ([place]) 06/24/2021   Fall 06/24/2021   Abnormal LFTs 06/24/2021   Olecranon bursitis of left elbow 06/12/2021   Chronic hip pain (Bilateral) 06/09/2021   Chronic use of opiate for therapeutic purpose 05/09/2021   Greater trochanteric bursitis of hip (Left) 03/09/2021   Bursitis of hip (Left) Q000111Q   Uncomplicated opioid dependence ([place]) 02/20/2021   Acute conjunctivitis of left eye 01/02/2021   Unintentional weight loss 11/21/2020   Broken teeth (Right) 08/02/2020   History of MI (myocardial infarction) (July 2017) 08/02/2020   Neuropathy 06/09/2020   Dental abscess 06/09/2020   Foot drop (Left) 06/09/2020   Carotid stenosis, asymptomatic, right 04/27/2020   Chronic migraine 11/03/2019   Thrombocytosis 09/16/2019   Erythrocytosis 09/16/2019   Hypercalcemia 09/06/2019   Leukocytosis 09/06/2019   Polycythemia 09/06/2019   Chronic hip pain (Left) 08/27/2019   Osteoarthritis of hip (Left) 08/27/2019   Gluteal tendonitis of buttock (Left) 08/27/2019   Radial nerve palsy (Right) 07/15/2019   Neuropathy of radial nerve (Right) 06/30/2019   Abnormal bruising 06/25/2019   History of carotid endarterectomy (Right) 03/04/2019   Pain medication agreement signed 03/04/2019   Atypical facial pain (Right) 01/27/2019   Chronic ear pain (Right) 01/27/2019   Chronic jaw pain (Right) 01/27/2019   Geniculate Neuralgia (Right) 01/27/2019   Vitamin D  deficiency 01/19/2019   Neurogenic pain 01/19/2019   Chronic anticoagulation (PLAVIX) 01/19/2019   Chronic pain syndrome 01/07/2019   Long term current use of opiate analgesic 01/07/2019   Long term prescription benzodiazepine use 01/07/2019   Pharmacologic therapy 01/07/2019   Disorder of skeletal system 01/07/2019   Problems influencing health status 01/07/2019   Chronic headaches (1ry area of Pain) (Right) 01/07/2019   Opiate use 09/24/2018   PVD (peripheral vascular  disease) ([place]) 06/24/2018   Chronic ankle pain (Bilateral) 12/27/2017   Tendinopathy of gluteus medius (Right) 12/27/2017   Tendinopathy of gluteus medius (Left) 12/27/2017   Bilateral hip pain 12/26/2017   Chronic elbow pain (Left) 10/17/2017   Elevated troponin I level 05/21/2017   Major depressive disorder, recurrent episode, moderate ([place]) 11/19/2016   Insomnia 10/30/2016   Constipation 07/25/2016   S/P CABG x 4 07/06/2016   Bradycardia    CAD (coronary artery disease)    Carotid stenosis    CAD in native artery 06/29/2016   Elevated troponin 06/28/2016   Essential hypertension, malignant 06/28/2016   Mild tobacco abuse in early remission 06/28/2016   Essential hypertension    Malignant hypertension    Type 2 diabetes mellitus with other specified complication ([place])    Chest pain with high risk for cardiac etiology 06/27/2016   NSTEMI (non-ST elevated myocardial infarction) ([place]) 06/27/2016   Proteinuria 03/15/2016   Renal artery stenosis ([place]) 03/15/2016   MDD (major depressive disorder) 10/17/2015   Agoraphobia with panic attacks 04/25/2015   HTN (hypertension), malignant 10/20/2013   Cluster headache 03/20/2012   HLD (hyperlipidemia) 07/26/2010   ADJUSTMENT DISORDER WITH MIXED FEATURES 07/26/2010   Deneise Lever, [place], [place] Speech-Language Pathologist  Aliene Altes 08/17/2021, 5:39 PM  [place] [address] [place], [place], [address] Phone: [phone]   Fax:  [phone]   Name: Erika [name] MRN: [id number] Date of Birth: [birth year]

## 2021-08-18 LAB — DRUG TOX MONITOR 1 W/CONF, ORAL FLD
Amphetamines: NEGATIVE ng/mL (ref <10)
Barbiturates: NEGATIVE ng/mL (ref <10)
Benzodiazepines: NEGATIVE ng/mL (ref <0.50)
Buprenorphine: NEGATIVE ng/mL (ref <0.10)
Cocaine: NEGATIVE ng/mL (ref <5.0)
Codeine: NEGATIVE ng/mL (ref <2.5)
Cotinine: 19.2 ng/mL — ABNORMAL HIGH (ref <5.0)
Dihydrocodeine: NEGATIVE ng/mL (ref <2.5)
Fentanyl: NEGATIVE ng/mL (ref <0.10)
Heroin Metabolite: NEGATIVE ng/mL (ref <1.0)
Hydrocodone: NEGATIVE ng/mL (ref <2.5)
Hydromorphone: NEGATIVE ng/mL (ref <2.5)
MARIJUANA: NEGATIVE ng/mL (ref <2.5)
MDMA: NEGATIVE ng/mL (ref <10)
Meprobamate: NEGATIVE ng/mL (ref <2.5)
Methadone: NEGATIVE ng/mL (ref <5.0)
Morphine: NEGATIVE ng/mL (ref <2.5)
Nicotine Metabolite: POSITIVE ng/mL — AB (ref <5.0)
Norhydrocodone: NEGATIVE ng/mL (ref <2.5)
Noroxycodone: 8.8 ng/mL — ABNORMAL HIGH (ref <2.5)
Opiates: POSITIVE ng/mL — AB (ref <2.5)
Oxycodone: 18.7 ng/mL — ABNORMAL HIGH (ref <2.5)
Oxymorphone: NEGATIVE ng/mL (ref <2.5)
Phencyclidine: NEGATIVE ng/mL (ref <10)
Tapentadol: NEGATIVE ng/mL (ref <5.0)
Tramadol: NEGATIVE ng/mL (ref <5.0)
Zolpidem: NEGATIVE ng/mL (ref <5.0)

## 2021-08-18 LAB — DRUG TOX ALC METAB W/CON, ORAL FLD: Alcohol Metabolite: NEGATIVE ng/mL (ref <25)

## 2021-08-21 ENCOUNTER — Ambulatory Visit: Payer: Managed Care, Other (non HMO) | Admitting: Occupational Therapy

## 2021-08-21 ENCOUNTER — Encounter: Payer: Self-pay | Admitting: Occupational Therapy

## 2021-08-21 ENCOUNTER — Encounter: Payer: Managed Care, Other (non HMO) | Admitting: Pain Medicine

## 2021-08-21 ENCOUNTER — Other Ambulatory Visit: Payer: Self-pay

## 2021-08-21 ENCOUNTER — Ambulatory Visit: Payer: Managed Care, Other (non HMO) | Admitting: Speech Pathology

## 2021-08-21 ENCOUNTER — Ambulatory Visit: Payer: Managed Care, Other (non HMO)

## 2021-08-21 DIAGNOSIS — R41841 Cognitive communication deficit: Secondary | ICD-10-CM | POA: Diagnosis not present

## 2021-08-21 DIAGNOSIS — M6281 Muscle weakness (generalized): Secondary | ICD-10-CM

## 2021-08-21 DIAGNOSIS — R278 Other lack of coordination: Secondary | ICD-10-CM

## 2021-08-21 DIAGNOSIS — R471 Dysarthria and anarthria: Secondary | ICD-10-CM

## 2021-08-21 DIAGNOSIS — R269 Unspecified abnormalities of gait and mobility: Secondary | ICD-10-CM

## 2021-08-21 DIAGNOSIS — R2681 Unsteadiness on feet: Secondary | ICD-10-CM

## 2021-08-21 NOTE — Therapy (Signed)
[place] MAIN [facility] SERVICES [address] [place], [place], [address] Phone: [phone]   Fax:  [phone]  Physical Therapy Treatment  Patient Details  Name: Erika [name] MRN: [id number] Date of Birth: [birth year] Referring Provider (PT): Leeroy Cha MD   Encounter Date: 08/21/2021   PT End of Session - 08/21/21 1545     Visit Number 5    Number of Visits 20    Date for PT Re-Evaluation 09/14/21    Authorization Type [employer] max visits 20x per year speech, OT, PT    Authorization Time Period 08/03/21-10/26/21    Progress Note Due on Visit 10    PT Start Time 1601    PT Stop Time [id number]    PT Time Calculation (min) 43 min    Equipment Utilized During Treatment Gait belt;Other (comment)   L AFO   Activity Tolerance Patient tolerated treatment well    Behavior During Therapy [facility] for tasks assessed/performed;Flat affect;Impulsive             Past Medical History:  Diagnosis Date   Arthralgia of temporomandibular joint    CAD, multiple vessel    a. 06/2016 Cath: ostLM 40%, ostLAD 40%, pLAD 95%, ost-pLCx 60%, pLCx 95%, mLCx 60%, mRCA 95%, D2 50%, LVSF nl;  b. 07/2016 CABG x 4 (LIMA->LAD, VG->Diag, VG->OM, VG->RCA); c. 08/2016 Cath: 3VD w/ 4/4 patent grafts. LAD distal to LIMA has diff dzs->Med rx; d. 08/2020 Cath: 4/4 patent grafts, native 3VD. EF 55-65%-->Med Rx.   Carotid arterial disease ([place])    a. 07/2016 s/p R CEA; b. 02/2021 U/S: RICA 123456, LICA 123456.   Clotting disorder ([place])    Depression    Diastolic dysfunction    a. 06/2016 Echo: EF 50-55%, mild inf wall HK, GR1DD, mild MR, RV sys fxn nl, mildly dilated LA, PASP nl   Fatty liver disease, nonalcoholic Q000111Q   History of blood transfusion    with heart surgery   HLD (hyperlipidemia)    Labile hypertension    a. prior renal ngiogram negative for RAS in 03/2016; b. catecholamines and metanephrines normal, mildly elevated renin with normal aldosterone and normal ratio in  02/2016   Myocardial infarction [facility]) 2017   Obesity    PAD (peripheral artery disease) ([place])    a. 09/2018 s/p L SFA stenting; b. 07/2019 Periph Angio: Patent m/d L SFA stent w/ 100% L SFA distal to stent. L AT 100d, L Peroneal diff dzs-->Med Rx; c. 02/2021 ABIs: stable @ 0.61 on R and 0.46 on L.   PTSD (post-traumatic stress disorder)    Tobacco abuse    Type 2 diabetes mellitus ([place]) 12/2015    Past Surgical History:  Procedure Laterality Date   ABDOMINAL AORTOGRAM W/LOWER EXTREMITY [name] 10/15/2018   Procedure: ABDOMINAL AORTOGRAM W/LOWER EXTREMITY;  Surgeon: Wellington Hampshire, MD;  Location: [place] CV LAB;  Service: Cardiovascular;  Laterality: [name];   ABDOMINAL AORTOGRAM W/LOWER EXTREMITY Bilateral 08/19/2019   Procedure: ABDOMINAL AORTOGRAM W/LOWER EXTREMITY;  Surgeon: Wellington Hampshire, MD;  Location: [place] [facility];  Service: Cardiovascular;  Laterality: Bilateral;   CARDIAC CATHETERIZATION [name] 06/29/2016   Procedure: Left Heart Cath and Coronary Angiography;  Surgeon: Minna Merritts, MD;  Location: [employer] [facility] LAB;  Service: Cardiovascular;  Laterality: [name];   CARDIAC CATHETERIZATION [name] 08/29/2016   Procedure: Left Heart Cath and Cors/Grafts Angiography;  Surgeon: Wellington Hampshire, MD;  Location: [place] CV LAB;  Service: Cardiovascular;  Laterality: [name];  CESAREAN SECTION     CHOLECYSTECTOMY     CORONARY ARTERY BYPASS GRAFT [name] 07/06/2016   Procedure: CORONARY ARTERY BYPASS GRAFTING (CABG) x four, using left internal mammary artery and right leg greater saphenous vein harvested endoscopically;  Surgeon: Ivin Poot, MD;  Location: [place];  Service: Open Heart Surgery;  Laterality: [name];   ENDARTERECTOMY Right 07/06/2016   Procedure: ENDARTERECTOMY CAROTID;  Surgeon: Rosetta Posner, MD;  Location: [facility] OR;  Service: Vascular;  Laterality: Right;   ENDARTERECTOMY Right 04/27/2020   Procedure: REDO OF RIGHT ENDARTERECTOMY CAROTID;  Surgeon: Rosetta Posner, MD;  Location: [facility] OR;   Service: Vascular;  Laterality: Right;   LEFT HEART CATH AND CORS/GRAFTS ANGIOGRAPHY [name] 08/24/2020   Procedure: LEFT HEART CATH AND CORS/GRAFTS ANGIOGRAPHY;  Surgeon: Wellington Hampshire, MD;  Location: [place] CV LAB;  Service: Cardiovascular;  Laterality: [name];   PERIPHERAL VASCULAR CATHETERIZATION [name] 04/18/2016   Procedure: Renal Angiography;  Surgeon: Wellington Hampshire, MD;  Location: [place] CV LAB;  Service: Cardiovascular;  Laterality: [name];   PERIPHERAL VASCULAR INTERVENTION Left 10/15/2018   Procedure: PERIPHERAL VASCULAR INTERVENTION;  Surgeon: Wellington Hampshire, MD;  Location: [place] [facility] LAB;  Service: Cardiovascular;  Laterality: Left;  Left superficial femoral   TEE WITHOUT CARDIOVERSION [name] 07/06/2016   Procedure: TRANSESOPHAGEAL ECHOCARDIOGRAM (TEE);  Surgeon: Ivin Poot, MD;  Location: [place];  Service: Open Heart Surgery;  Laterality: [name];   TONSILLECTOMY      There were no vitals filed for this visit.   Subjective Assessment - 08/21/21 1654     Subjective Patient presents with husband. Reports she hasn't gotten new AFO yet due to mixup with [facility].    Pertinent History Pt presented to [facility]/24[facility]21 c c/c of slurred speech, L facial droop & LUE weakness. MRI revealed acute R pontine perforator infarct with chronic R parietal & occipital infarcts. PMH: HTN, HLD, DM, tobacco abuse, PAD, CAD, CABG, dCHF, peptic foot drop, renal artery stenosis, MI and CAD (s/p of R CEA 2017), PTSD, adjustment disorder, obesity, Left hip OA s/p OPPT 2020. Pt DC 06/28/21 to inpatient rehab in [place] to home 08/01/21. AT CIR pt worked extensively on STS with MinA or Supervision and HW, Stand and squat pivot transfers with MinA, AMB progressed to ~52f max, mostly with HW (some RW use with fixed LUE on handle), but was mostly limited in further progression of distance 2/2 chronic PAD ischaemic pain or Left knee buckling despite AFO use. CIR recommending update to more supporting AFO at  transfer to OPPT. Since home, pt is using WC to navigate the environment, family assists with ADL performance, WC still needs to be modified to fit through BR doorframe. Pt reports not feeling safe with HW at present. Husband has been helping with gnelte stretching in mornings as pt sleeps in a flexion synergy posture on LLE.    Limitations Walking;Standing    How long can you sit comfortably? Generally well tolerated with occasional buttocks soreness    How long can you stand comfortably? At eval: tolerates <2 minutes prior to ischemic leg pain    How long can you walk comfortably? Longest distance at CIR last month ~251f    Currently in Pain? Yes    Pain Score 4     Pain Location Shoulder    Pain Orientation Left    Pain Descriptors / Indicators Aching    Pain Type Acute pain    Pain Onset More than  a month ago    Pain Frequency Constant                     Neuro Re-ed: With mirror in front and hemiwalker in RUE -standing weight shift with min A from PT to shift pelvis towards the left 15x with green pad under RLE.  -march/lift RLE for weight shift onto LLE; min A to pelvis to shift onto LLE 12x -forward step to touch hedgehog with RLE 12x with min A to pelvis to weight shift onto LLE  Therapeutic Exercises:  Soccer ball LAQ for sequencing of muscle activation, timing, and reaction response x 15 ; challenging with timing of muscle recruitment  Soccer ball hamstring curl with min/mod A for hamstring curl portion  Hamstring isometric of LLE 12x 3 second holds  Hamstring lengthening stretch 60 seconds with leg on PT shoulder   Seated weight shifts left/right "booty bump" to focus on hip shift rather than pushing with shoulders to shift. X2 minutes   Transfer training:  First attempt pt required min A to prevent Lob, increased time, and frequent cues in order to facilitate proper movement- including scooting to edge and hand/foot placement and to push up from plinth table  Patient did demo improved ability after attempting multiple attempts (5x)   Stand pivot transfer from w/c to/from plinth; cues for sequencing, placement of hemiwalker, foot placement x 2  Education provided throughout session via VC/TC and demonstration to facilitate movement at target joints and correct muscle activation for all testing and exercises performed.      Patient demonstrates excellent motivation throughout physical therapy session. Session focused on weight shift and stabilization of LLE. Patient is fearful of LOB and reports feeling off centered upon reaching midline. Use of mirror to enforce positioning required. Patient demonstrates improved transfers by end of session utilizing LLE more frequently and with improved hip shift onto affected limb. Pt will benefit from skilled PT evaluation to maximize safety, independence, and tolerance to basic moblity required for ADL performance and accessing the community and to reduce risk of falls in the home.                   PT Education - 08/21/21 1545     Education Details body mechanics, stabilization with movement    Person(s) Educated Patient    Methods Explanation;Demonstration;Tactile cues;Verbal cues    Comprehension Verbalized understanding;Returned demonstration;Verbal cues required;Tactile cues required              PT Short Term Goals - 08/03/21 1645       PT SHORT TERM GOAL #1   Title Pt to demonstrate improved tolerance to standing >3 minutes without physical assist or LOB. DME ad lib    Baseline <2 minutes at eval    Time 4    Period Weeks    Status New    Target Date 08/31/21      PT SHORT TERM GOAL #2   Title Pt to demonstrate perfromance of 5xSTS c AFO/Hemi walker at supervision level independence without LOB or physical assist.    Baseline Needs minGuard to MinA to perform at eval    Time 4    Period Weeks    Status New    Target Date 08/31/21      PT SHORT TERM GOAL #3   Title Pt to  demonstrate AMB ~62f at supervision level with AFO, HW without LOB.    Baseline [facility]  [facility] and AFO.    Time 6    Period Weeks    Status New    Target Date 09/14/21      PT SHORT TERM GOAL #4   Title Pt to demonstrate TUG test <120sec c HW and AFO at minguard - MinA level to improve independence with transfers and reduce falls risk at home.    Baseline No tug performed at eval:    Time 7    Period Weeks    Status New    Target Date 09/21/21               PT Long Term Goals - 08/03/21 1647       PT LONG TERM GOAL #1   Title Pt to demonstrate perfromance of 5xSTS c AFO/Hemi walker at supervision level independence without LOB or physical assist in less than 20sec.    Baseline Unable at eval, requires minguard to minA    Time 8    Period Weeks    Status New    Target Date 09/28/21      PT LONG TERM GOAL #2   Title Pt to demonstrate FOTO score improvement >10 points to demonstrate improved perception of ability in ADL    Baseline 23 at eval:    Time 8    Period Weeks    Status New    Target Date 09/28/21      PT LONG TERM GOAL #3   Title Pt able to demonstrate stand pivot transfer with HW and AFO without LOB at supervision level to modI.    Baseline Needs minGuard to MinA for safety    Time 8    Period Weeks    Status New    Target Date 09/28/21      PT LONG TERM GOAL #4   Title Pt to demonstrate TUG test <60sec c HW and AFO at supervision level to improve independence with transfers and reduce falls risk at home.    Baseline No Tug at [facility]    Time 12    Period Weeks    Status New    Target Date 10/26/21                   Plan - 08/21/21 1701     Clinical Impression Statement Patient demonstrates excellent motivation throughout physical therapy session. Session focused on weight shift and stabilization of LLE. Patient is fearful of LOB and reports feeling off centered upon reaching midline. Use of mirror to enforce positioning  required. Patient demonstrates improved transfers by end of session utilizing LLE more frequently and with improved hip shift onto affected limb. Pt will benefit from skilled PT evaluation to maximize safety, independence, and tolerance to basic moblity required for ADL performance and accessing the community and to reduce risk of falls in the home.    Personal Factors and Comorbidities Age;Comorbidity 3+;Fitness;Past/Current Experience;Time since onset of injury/illness/exacerbation    Comorbidities DM, HTN, PVD, clotting disorder, CAD    Examination-Activity Limitations Squat;Stairs;Bathing;Bed Mobility;Dressing;Reach Overhead;Hygiene/Grooming;Bend;Carry;Locomotion Level;Transfers;Toileting;Stand    Examination-Participation Restrictions Cleaning;Community Activity;Occupation;Laundry;Driving;Yard Work    [employer] Evolving/Moderate complexity    Rehab Potential Good    PT Frequency 2x / week    PT Duration 12 weeks    PT Treatment/Interventions Electrical Stimulation;Gait training;Stair training;Functional mobility training;Therapeutic activities;Therapeutic exercise;Balance training;Neuromuscular re-education;Patient/family education;Manual techniques;Wheelchair mobility training;Energy conservation;Passive range of motion;Orthotic Fit/Training;DME Instruction;ADLs/Self Care Home Management    PT Next Visit Plan work toward supervision level STS and SPT c  HW, sustained standing tolerance.    PT Home Exercise Plan Not updated this visit- Encouraged seated LE strengthening and practicing gait with husband.    Consulted and Agree with Plan of Care Patient;Family member/caregiver    Family Member Consulted husband             Patient will benefit from skilled therapeutic intervention in order to improve the following deficits and impairments:  Decreased endurance, Decreased mobility, Difficulty walking, Hypomobility, Decreased balance, Abnormal gait, Increased muscle  spasms, Cardiopulmonary status limiting activity, Impaired flexibility, Hypermobility, Decreased strength, Decreased activity tolerance  Visit Diagnosis: Muscle weakness (generalized)  Abnormality of gait and mobility  Unsteadiness on feet     Problem List Patient Active Problem List   Diagnosis Date Noted   Cognitive communication deficit 08/10/2021   Urinary incontinence 08/10/2021   GERD (gastroesophageal reflux disease) 08/03/2021   Chronic post-traumatic stress disorder (PTSD)    Depression with anxiety    Right pontine stroke ([place]) 06/28/2021   Hemiplegia and hemiparesis following cerebral infarction affecting left non-dominant side ([place]) 06/24/2021   Chronic diastolic CHF (congestive heart failure) ([place]) 06/24/2021   Fall 06/24/2021   Abnormal LFTs 06/24/2021   Olecranon bursitis of left elbow 06/12/2021   Chronic hip pain (Bilateral) 06/09/2021   Chronic use of opiate for therapeutic purpose 05/09/2021   Greater trochanteric bursitis of hip (Left) 03/09/2021   Bursitis of hip (Left) [id number]   Uncomplicated opioid dependence ([place]) 02/20/2021   Acute conjunctivitis of left eye 01/02/2021   Unintentional weight loss 11/21/2020   Broken teeth (Right) 08/02/2020   History of MI (myocardial infarction) (July 2017) 08/02/2020   Neuropathy 06/09/2020   Dental abscess 06/09/2020   Foot drop (Left) 06/09/2020   Carotid stenosis, asymptomatic, right 04/27/2020   Chronic migraine 11/03/2019   Thrombocytosis 09/16/2019   Erythrocytosis 09/16/2019   Hypercalcemia 09/06/2019   Leukocytosis 09/06/2019   Polycythemia 09/06/2019   Chronic hip pain (Left) 08/27/2019   Osteoarthritis of hip (Left) 08/27/2019   Gluteal tendonitis of buttock (Left) 08/27/2019   Radial nerve palsy (Right) 07/15/2019   Neuropathy of radial nerve (Right) 06/30/2019   Abnormal bruising 06/25/2019   History of carotid endarterectomy (Right) 03/04/2019   Pain medication agreement signed 03/04/2019    Atypical facial pain (Right) 01/27/2019   Chronic ear pain (Right) 01/27/2019   Chronic jaw pain (Right) 01/27/2019   Geniculate Neuralgia (Right) 01/27/2019   Vitamin D deficiency 01/19/2019   Neurogenic pain 01/19/2019   Chronic anticoagulation (PLAVIX) 01/19/2019   Chronic pain syndrome 01/07/2019   Long term current use of opiate analgesic 01/07/2019   Long term prescription benzodiazepine use 01/07/2019   Pharmacologic therapy 01/07/2019   Disorder of skeletal system 01/07/2019   Problems influencing health status 01/07/2019   Chronic headaches (1ry area of Pain) (Right) 01/07/2019   Opiate use 09/24/2018   PVD (peripheral vascular disease) ([place]) 06/24/2018   Chronic ankle pain (Bilateral) 12/27/2017   Tendinopathy of gluteus medius (Right) 12/27/2017   Tendinopathy of gluteus medius (Left) 12/27/2017   Bilateral hip pain 12/26/2017   Chronic elbow pain (Left) 10/17/2017   Elevated troponin I level 05/21/2017   Major depressive disorder, recurrent episode, moderate (HCC) 11/19/2016   Insomnia 10/30/2016   Constipation 07/25/2016   S/P CABG x 4 07/06/2016   Bradycardia    CAD (coronary artery disease)    Carotid stenosis    CAD in native artery 06/29/2016   Elevated troponin 06/28/2016   Essential hypertension, malignant 06/28/2016   Mild  tobacco abuse in early remission 06/28/2016   Essential hypertension    Malignant hypertension    Type 2 diabetes mellitus with other specified complication (HCC)    Chest pain with high risk for cardiac etiology 06/27/2016   NSTEMI (non-ST elevated myocardial infarction) ([place]) 06/27/2016   Proteinuria 03/15/2016   Renal artery stenosis ([place]) 03/15/2016   MDD (major depressive disorder) 10/17/2015   Agoraphobia with panic attacks 04/25/2015   HTN (hypertension), malignant 10/20/2013   Cluster headache 03/20/2012   HLD (hyperlipidemia) 07/26/2010   ADJUSTMENT DISORDER WITH MIXED FEATURES 07/26/2010    Janna Arch, PT,  DPT  08/21/2021, 5:02 PM  [place] MAIN [facility] SERVICES [address] [place], [place], [address] Phone: [phone]   Fax:  [phone]  Name: Erika [name] MRN: [id number] Date of Birth: Dec 19, 1971

## 2021-08-21 NOTE — Therapy (Signed)
[place] MAIN [facility] SERVICES [address] [place], [place], [address] Phone: [phone]   Fax:  [phone]  Occupational Therapy Treatment  Patient Details  Name: GLADYS KEARLEY MRN: [id number] Date of Birth: 10/20/1971 Referring Provider (OT): Dr. Leeroy Cha   Encounter Date: 08/21/2021   OT End of Session - 08/21/21 1546     Visit Number 5    Number of Visits 20    Date for OT Re-Evaluation 10/25/21    Authorization Type limited to 20 visits combined/multidiscipline    OT Start Time 1430    OT Stop Time 1515    OT Time Calculation (min) 45 min    Activity Tolerance Patient tolerated treatment well    Behavior During Therapy [facility] for tasks assessed/performed;Flat affect;Impulsive             Past Medical History:  Diagnosis Date   Arthralgia of temporomandibular joint    CAD, multiple vessel    a. 06/2016 Cath: ostLM 40%, ostLAD 40%, pLAD 95%, ost-pLCx 60%, pLCx 95%, mLCx 60%, mRCA 95%, D2 50%, LVSF nl;  b. 07/2016 CABG x 4 (LIMA->LAD, VG->Diag, VG->OM, VG->RCA); c. 08/2016 Cath: 3VD w/ 4/4 patent grafts. LAD distal to LIMA has diff dzs->Med rx; d. 08/2020 Cath: 4/4 patent grafts, native 3VD. EF 55-65%-->Med Rx.   Carotid arterial disease ([place])    a. 07/2016 s/p R CEA; b. 02/2021 U/S: RICA 123456, LICA 123456.   Clotting disorder ([place])    Depression    Diastolic dysfunction    a. 06/2016 Echo: EF 50-55%, mild inf wall HK, GR1DD, mild MR, RV sys fxn nl, mildly dilated LA, PASP nl   Fatty liver disease, nonalcoholic Q000111Q   History of blood transfusion    with heart surgery   HLD (hyperlipidemia)    Labile hypertension    a. prior renal ngiogram negative for RAS in 03/2016; b. catecholamines and metanephrines normal, mildly elevated renin with normal aldosterone and normal ratio in 02/2016   Myocardial infarction [facility]) 2017   Obesity    PAD (peripheral artery disease) ([place])    a. 09/2018 s/p L SFA stenting; b. 07/2019 Periph  Angio: Patent m/d L SFA stent w/ 100% L SFA distal to stent. L AT 100d, L Peroneal diff dzs-->Med Rx; c. 02/2021 ABIs: stable @ 0.61 on R and 0.46 on L.   PTSD (post-traumatic stress disorder)    Tobacco abuse    Type 2 diabetes mellitus ([place]) 12/2015    Past Surgical History:  Procedure Laterality Date   ABDOMINAL AORTOGRAM W/LOWER EXTREMITY [name] 10/15/2018   Procedure: ABDOMINAL AORTOGRAM W/LOWER EXTREMITY;  Surgeon: Wellington Hampshire, MD;  Location: [place] CV LAB;  Service: Cardiovascular;  Laterality: [name];   ABDOMINAL AORTOGRAM W/LOWER EXTREMITY Bilateral 08/19/2019   Procedure: ABDOMINAL AORTOGRAM W/LOWER EXTREMITY;  Surgeon: Wellington Hampshire, MD;  Location: [place] CV LAB;  Service: Cardiovascular;  Laterality: Bilateral;   CARDIAC CATHETERIZATION [name] 06/29/2016   Procedure: Left Heart Cath and Coronary Angiography;  Surgeon: Minna Merritts, MD;  Location: [place] CV LAB;  Service: Cardiovascular;  Laterality: [name];   CARDIAC CATHETERIZATION [name] 08/29/2016   Procedure: Left Heart Cath and Cors/Grafts Angiography;  Surgeon: Wellington Hampshire, MD;  Location: [place] CV LAB;  Service: Cardiovascular;  Laterality: [name];   CESAREAN SECTION     CHOLECYSTECTOMY     CORONARY ARTERY BYPASS GRAFT [name] 07/06/2016   Procedure: CORONARY ARTERY BYPASS GRAFTING (CABG) x four, using left internal mammary  artery and right leg greater saphenous vein harvested endoscopically;  Surgeon: Ivin Poot, MD;  Location: [place];  Service: Open Heart Surgery;  Laterality: [name];   ENDARTERECTOMY Right 07/06/2016   Procedure: ENDARTERECTOMY CAROTID;  Surgeon: Rosetta Posner, MD;  Location: [facility] OR;  Service: Vascular;  Laterality: Right;   ENDARTERECTOMY Right 04/27/2020   Procedure: REDO OF RIGHT ENDARTERECTOMY CAROTID;  Surgeon: Rosetta Posner, MD;  Location: [facility] OR;  Service: Vascular;  Laterality: Right;   LEFT HEART CATH AND CORS/GRAFTS ANGIOGRAPHY [name] 08/24/2020   Procedure: LEFT HEART CATH AND CORS/GRAFTS  ANGIOGRAPHY;  Surgeon: Wellington Hampshire, MD;  Location: [place] CV LAB;  Service: Cardiovascular;  Laterality: [name];   PERIPHERAL VASCULAR CATHETERIZATION [name] 04/18/2016   Procedure: Renal Angiography;  Surgeon: Wellington Hampshire, MD;  Location: [place] CV LAB;  Service: Cardiovascular;  Laterality: [name];   PERIPHERAL VASCULAR INTERVENTION Left 10/15/2018   Procedure: PERIPHERAL VASCULAR INTERVENTION;  Surgeon: Wellington Hampshire, MD;  Location: [place] CV LAB;  Service: Cardiovascular;  Laterality: Left;  Left superficial femoral   TEE WITHOUT CARDIOVERSION [name] 07/06/2016   Procedure: TRANSESOPHAGEAL ECHOCARDIOGRAM (TEE);  Surgeon: Ivin Poot, MD;  Location: [place];  Service: Open Heart Surgery;  Laterality: [name];   TONSILLECTOMY      There were no vitals filed for this visit.   Subjective Assessment - 08/21/21 1545     Subjective  Pt. reports having 8/10 cluster headache, and leg pain    Patient is accompanied by: Family member    Pertinent History HX of CAD, CABG, DM2, s/p R pontine stroke    Limitations L sided hemiplegia, impulsivity, judgment, safety awareness    Currently in Pain? Yes    Pain Score 4     Pain Location Shoulder    Pain Orientation Left            OT TREATMENT      Therapeutic Exercise:   Pt. tolerated PROM in all joint ranges for left shoulder flexion, extension, abduction, horizontal abduction, and adduction, elbow flexion, extension, forearm supination, pronation, wrist extension, digit MP, PIP, and DIP flexion, and extension. Pt. Performed PROM for hand to face patterns as well as PNF for diagonal patterns in preparation for like grooming.  Patient worked active assistive range of motion at the tabletop for shoulder flexion and abduction.  Patient worked on the bilateral UBE for her UE reciprocal motion from a seated position for 5 minutes with Ace wrap in place to the left hand.  Patient required support proximally at her elbow.    Manual  Therapy:   Pt. tolerated scapular mobilizations for elevation, depression, abduction/rotation.  Pt. Tolerated soft tissue mobilizations for metacarpal spread stretches to the left hand.  Patient tolerated soft tissue massage to the left shoulder and scapular region. Manual therapy was performed independent of, and in preparation for ther. Ex.   Patient reports she had a fall when when her LE buckled while standing, and sidestepping on her way to the bathroom. Pt. Reports that her mother, and daughter were with her at the time.  Patient is still waiting to receive her e-stim unit from the vendor. Patient reports having changes with reductions in 2 pain medications. Pt. tolerated ROM, PNF patterns and manual therapy well. Pt. education was provided about finding opportunities to engage the LUE in proprioceptive input, forearm weightbearing at a tabletop surface, and engaging it as a gross assist during daily activities. Pt. continues to work on improving  and maximizing independence with ADLs, and IADL tasks.                          OT Education - 08/21/21 1546     Education Details PROM to LUE, weightbearing    Person(s) Educated Patient;Spouse    Methods Explanation;Demonstration;Verbal cues    Comprehension Verbalized understanding;Returned demonstration;Need further instruction;Verbal cues required              OT Short Term Goals - 08/04/21 0914       OT SHORT TERM GOAL #1   Title Pt will be perform HEP for LUE with min vc.    Baseline Eval: Initiated at eval, further instruction needed    Time 6    Period Weeks    Status New    Target Date 09/13/21               OT Long Term Goals - 08/04/21 0915       OT LONG TERM GOAL #1   Title Pt will increase FOTO score to 40 or better to indicate increased functional performance    Baseline Eval: 23    Time 12    Period Weeks    Status New    Target Date 10/25/21      OT LONG TERM GOAL #2   Title Pt will  perform UB ADLs with set up    Baseline Eval: Mod A to perform UB ADLs    Time 12    Period Weeks    Status New    Target Date 10/25/21      OT LONG TERM GOAL #3   Title Pt will perform LB ADLs with min A    Baseline Eval: Max A to perform LB ADLs    Time 12    Period Weeks    Status New    Target Date 10/25/21      OT LONG TERM GOAL #4   Title Pt will perform ADL transfers with distant supv    Baseline Eval: min A-mod A to perform ADL transfers    Time 12    Period Weeks    Status New    Target Date 10/25/21      OT LONG TERM GOAL #5   Title Pt will increase LUE strength by 2 MM grades to work towards use of LUE as a stabilizer for self care.    Baseline Eval: L shoulder 1/5, elbow/wrist/hand 0/5.  Unable to use LUE as a stabilzer for self care.    Time 12    Period Weeks    Status New    Target Date 10/25/21                   Plan - 08/21/21 1547     Clinical Impression Statement Patient reports she had a fall when when her LE buckled while standing, and sidestepping on her way to the bathroom. Pt. Reports that her mother, and daughter were with her at the time.  Patient is still waiting to receive her e-stim unit from the vendor. Patient reports having changes with reductions in 2 pain medications. Pt. tolerated ROM, PNF patterns and manual therapy well. Pt. education was provided about finding opportunities to engage the LUE in proprioceptive input, forearm weightbearing at a tabletop surface, and engaging it as a gross assist during daily activities. Pt. continues to work on improving and maximizing independence with ADLs, and IADL tasks.  OT Occupational Profile and History Detailed Assessment- Review of Records and additional review of physical, cognitive, psychosocial history related to current functional performance    Occupational performance deficits (Please refer to evaluation for details): ADL's;IADL's;Leisure;Work    Marketing executive / Function /  Physical Skills ADL;Continence;Dexterity;Strength;Vision;Balance;Coordination;FMC;IADL;Body mechanics;Endurance;Gait;UE functional use;Decreased knowledge of use of DME;GMC;Mobility    Rehab Potential Good    Clinical Decision Making Several treatment options, min-mod task modification necessary    Comorbidities Affecting Occupational Performance: May have comorbidities impacting occupational performance    Modification or Assistance to Complete Evaluation  Min-Moderate modification of tasks or assist with assess necessary to complete eval    OT Frequency 2x / week    OT Duration 12 weeks    OT Treatment/Interventions Self-care/ADL training;Therapeutic exercise;DME and/or AE instruction;Functional Mobility Training;Balance training;Electrical Stimulation;Neuromuscular education;Manual Therapy;Splinting;Visual/perceptual remediation/compensation;Moist Heat;Passive range of motion;Therapeutic activities;Patient/family education    Consulted and Agree with Plan of Care Patient;Family member/caregiver             Patient will benefit from skilled therapeutic intervention in order to improve the following deficits and impairments:   Body Structure / Function / Physical Skills: ADL, Continence, Dexterity, Strength, Vision, Balance, Coordination, [place], IADL, Body mechanics, Endurance, Gait, UE functional use, Decreased knowledge of use of DME, [place], Mobility       Visit Diagnosis: Muscle weakness (generalized)  Other lack of coordination    Problem List Patient Active Problem List   Diagnosis Date Noted   Cognitive communication deficit 08/10/2021   Urinary incontinence 08/10/2021   GERD (gastroesophageal reflux disease) 08/03/2021   Chronic post-traumatic stress disorder (PTSD)    Depression with anxiety    Right pontine stroke ([place]) 06/28/2021   Hemiplegia and hemiparesis following cerebral infarction affecting left non-dominant side ([place]) 06/24/2021   Chronic diastolic CHF  (congestive heart failure) ([place]) 06/24/2021   Fall 06/24/2021   Abnormal LFTs 06/24/2021   Olecranon bursitis of left elbow 06/12/2021   Chronic hip pain (Bilateral) 06/09/2021   Chronic use of opiate for therapeutic purpose 05/09/2021   Greater trochanteric bursitis of hip (Left) 03/09/2021   Bursitis of hip (Left) Q000111Q   Uncomplicated opioid dependence ([place]) 02/20/2021   Acute conjunctivitis of left eye 01/02/2021   Unintentional weight loss 11/21/2020   Broken teeth (Right) 08/02/2020   History of MI (myocardial infarction) (July 2017) 08/02/2020   Neuropathy 06/09/2020   Dental abscess 06/09/2020   Foot drop (Left) 06/09/2020   Carotid stenosis, asymptomatic, right 04/27/2020   Chronic migraine 11/03/2019   Thrombocytosis 09/16/2019   Erythrocytosis 09/16/2019   Hypercalcemia 09/06/2019   Leukocytosis 09/06/2019   Polycythemia 09/06/2019   Chronic hip pain (Left) 08/27/2019   Osteoarthritis of hip (Left) 08/27/2019   Gluteal tendonitis of buttock (Left) 08/27/2019   Radial nerve palsy (Right) 07/15/2019   Neuropathy of radial nerve (Right) 06/30/2019   Abnormal bruising 06/25/2019   History of carotid endarterectomy (Right) 03/04/2019   Pain medication agreement signed 03/04/2019   Atypical facial pain (Right) 01/27/2019   Chronic ear pain (Right) 01/27/2019   Chronic jaw pain (Right) 01/27/2019   Geniculate Neuralgia (Right) 01/27/2019   Vitamin D deficiency 01/19/2019   Neurogenic pain 01/19/2019   Chronic anticoagulation (PLAVIX) 01/19/2019   Chronic pain syndrome 01/07/2019   Long term current use of opiate analgesic 01/07/2019   Long term prescription benzodiazepine use 01/07/2019   Pharmacologic therapy 01/07/2019   Disorder of skeletal system 01/07/2019   Problems influencing health status 01/07/2019   Chronic headaches (1ry area  of Pain) (Right) 01/07/2019   Opiate use 09/24/2018   PVD (peripheral vascular disease) ([place]) 06/24/2018   Chronic ankle pain  (Bilateral) 12/27/2017   Tendinopathy of gluteus medius (Right) 12/27/2017   Tendinopathy of gluteus medius (Left) 12/27/2017   Bilateral hip pain 12/26/2017   Chronic elbow pain (Left) 10/17/2017   Elevated troponin I level 05/21/2017   Major depressive disorder, recurrent episode, moderate ([place]) 11/19/2016   Insomnia 10/30/2016   Constipation 07/25/2016   S/P CABG x 4 07/06/2016   Bradycardia    CAD (coronary artery disease)    Carotid stenosis    CAD in native artery 06/29/2016   Elevated troponin 06/28/2016   Essential hypertension, malignant 06/28/2016   Mild tobacco abuse in early remission 06/28/2016   Essential hypertension    Malignant hypertension    Type 2 diabetes mellitus with other specified complication ([place])    Chest pain with high risk for cardiac etiology 06/27/2016   NSTEMI (non-ST elevated myocardial infarction) ([place]) 06/27/2016   Proteinuria 03/15/2016   Renal artery stenosis ([place]) 03/15/2016   MDD (major depressive disorder) 10/17/2015   Agoraphobia with panic attacks 04/25/2015   HTN (hypertension), malignant 10/20/2013   Cluster headache 03/20/2012   HLD (hyperlipidemia) 07/26/2010   ADJUSTMENT DISORDER WITH MIXED FEATURES 07/26/2010    Harrel Carina, MS, OTR/L 08/21/2021, 3:51 PM  [place] MAIN [facility] SERVICES [place], [place], [address] Phone: [phone]   Fax:  [phone]  Name: MANDALYNN SINGH MRN: [id number] Date of Birth: May 07, 1971

## 2021-08-22 ENCOUNTER — Telehealth: Payer: Self-pay | Admitting: *Deleted

## 2021-08-22 ENCOUNTER — Encounter: Payer: Self-pay | Admitting: Cardiovascular Disease

## 2021-08-22 ENCOUNTER — Ambulatory Visit (INDEPENDENT_AMBULATORY_CARE_PROVIDER_SITE_OTHER): Payer: Managed Care, Other (non HMO) | Admitting: Cardiovascular Disease

## 2021-08-22 VITALS — BP 162/96 | HR 91 | Ht 66.0 in | Wt 213.0 lb

## 2021-08-22 DIAGNOSIS — Z951 Presence of aortocoronary bypass graft: Secondary | ICD-10-CM

## 2021-08-22 DIAGNOSIS — I10 Essential (primary) hypertension: Secondary | ICD-10-CM

## 2021-08-22 DIAGNOSIS — I2510 Atherosclerotic heart disease of native coronary artery without angina pectoris: Secondary | ICD-10-CM | POA: Diagnosis not present

## 2021-08-22 DIAGNOSIS — Z720 Tobacco use: Secondary | ICD-10-CM

## 2021-08-22 DIAGNOSIS — I739 Peripheral vascular disease, unspecified: Secondary | ICD-10-CM | POA: Diagnosis not present

## 2021-08-22 DIAGNOSIS — Z9889 Other specified postprocedural states: Secondary | ICD-10-CM

## 2021-08-22 DIAGNOSIS — E785 Hyperlipidemia, unspecified: Secondary | ICD-10-CM

## 2021-08-22 MED ORDER — CARVEDILOL 6.25 MG PO TABS: 6.2500 mg | ORAL_TABLET | Freq: Two times a day (BID) | ORAL | 1 refills | Status: DC

## 2021-08-22 NOTE — Telephone Encounter (Signed)
Oral swab drug screen was consistent for prescribed medications.  ?

## 2021-08-22 NOTE — Telephone Encounter (Signed)
Prescrition, demographics, and clinic note faxed to [facility] for AFO

## 2021-08-22 NOTE — Progress Notes (Signed)
Cardiology Office Note   Date:  08/22/2021   ID:  Erika [name], Erika [name] [birth year], MRN [id number]  PCP:  Lesleigh Noe, MD Cardiologist:   [name], MD   Chief Complaint  Patient presents with   Other    6 month follow up -- Patient c.o swelling in legs and hand. Meds reviewed verbally with patient.       History of Present Illness: Erika [name] is a 50 y.o. female who presents for a follow-up visit  regarding extensive cardiovascular history. She has known history of coronary artery disease with previous non-ST elevation myocardial infarction in 2017.  She was found to have three-vessel coronary artery disease at that time and underwent CABG and right carotid endarterectomy. She was rehospitalized in September, 2017 with chest pain in the setting of uncontrolled hypertension. Cardiac catheterization showed patent grafts . The LAD had diffuse disease distal to the anastomosis and was very small in caliber. Ejection fraction was normal with mildly elevated left ventricular end-diastolic pressure.    She has history of difficult to control hypertension with labile blood pressure.  No evidence of renal artery stenosis on previous angiography.  She has known history of severe mixed hyperlipidemia .   She is known to have peripheral arterial disease with previous stenting of the left SFA. She was subsequently found to have occluded left SFA.  She subsequently had right leg claudication and was found to have an occluded right SFA.    She underwent repeat cardiac catheterization in September 2021 due to worsening angina.  Cardiac cath showed progression of native coronary artery disease but patent grafts with no obstructive disease.  Medical therapy was recommended.    She was hospitalized in July with acute stroke.  She was found to have mildly elevated troponin.  Her symptoms included facial droop and left-sided weakness.  Her stroke was treated medically.  Elevated  troponin was felt to be due to demand ischemia.  Echocardiogram was done which showed normal LV systolic function with no significant valvular abnormalities.  Hospitalization was complicated by aspiration pneumonia.  Some of her antihypertensive medications were discontinued given that her blood pressure was somewhat on the low side.  She was discharged to rehab and stayed there for few weeks.  She continues to have significant residual left-sided weakness.  She quit smoking since then.   Past Medical History:  Diagnosis Date   Arthralgia of temporomandibular joint    CAD, multiple vessel    a. 06/2016 Cath: ostLM 40%, ostLAD 40%, pLAD 95%, ost-pLCx 60%, pLCx 95%, mLCx 60%, mRCA 95%, D2 50%, LVSF nl;  b. 07/2016 CABG x 4 (LIMA->LAD, VG->Diag, VG->OM, VG->RCA); c. 08/2016 Cath: 3VD w/ 4/4 patent grafts. LAD distal to LIMA has diff dzs->Med rx; d. 08/2020 Cath: 4/4 patent grafts, native 3VD. EF 55-65%-->Med Rx.   Carotid arterial disease ([place])    a. 07/2016 s/p R CEA; b. 02/2021 U/S: RICA 123456, LICA 123456.   Clotting disorder ([place])    Depression    Diastolic dysfunction    a. 06/2016 Echo: EF 50-55%, mild inf wall HK, GR1DD, mild MR, RV sys fxn nl, mildly dilated LA, PASP nl   Fatty liver disease, nonalcoholic [id number]   History of blood transfusion    with heart surgery   HLD (hyperlipidemia)    Labile hypertension    a. prior renal ngiogram negative for RAS in 03/2016; b. catecholamines and metanephrines normal, mildly elevated renin with normal aldosterone and normal ratio  in 02/2016   Myocardial infarction [facility]) 2017   Obesity    PAD (peripheral artery disease) ([place])    a. 09/2018 s/p L SFA stenting; b. 07/2019 Periph Angio: Patent m/d L SFA stent w/ 100% L SFA distal to stent. L AT 100d, L Peroneal diff dzs-->Med Rx; c. 02/2021 ABIs: stable @ 0.61 on R and 0.46 on L.   PTSD (post-traumatic stress disorder)    Tobacco abuse    Type 2 diabetes mellitus ([place]) 12/2015    Past Surgical History:   Procedure Laterality Date   ABDOMINAL AORTOGRAM W/LOWER EXTREMITY [name] 10/15/2018   Procedure: ABDOMINAL AORTOGRAM W/LOWER EXTREMITY;  Surgeon: Wellington Hampshire, MD;  Location: [place] [facility];  Service: Cardiovascular;  Laterality: [name];   ABDOMINAL AORTOGRAM W/LOWER EXTREMITY Bilateral 08/19/2019   Procedure: ABDOMINAL AORTOGRAM W/LOWER EXTREMITY;  Surgeon: Wellington Hampshire, MD;  Location: [place] [facility];  Service: Cardiovascular;  Laterality: Bilateral;   CARDIAC CATHETERIZATION [name] 06/29/2016   Procedure: Left Heart Cath and Coronary Angiography;  Surgeon: Minna Merritts, MD;  Location: [place] [facility];  Service: Cardiovascular;  Laterality: [name];   CARDIAC CATHETERIZATION [name] 08/29/2016   Procedure: Left Heart Cath and Cors/Grafts Angiography;  Surgeon: Wellington Hampshire, MD;  Location: [place] [facility];  Service: Cardiovascular;  Laterality: [name];   CESAREAN SECTION     CHOLECYSTECTOMY     CORONARY ARTERY BYPASS GRAFT [name] 07/06/2016   Procedure: CORONARY ARTERY BYPASS GRAFTING (CABG) x four, using left internal mammary artery and right leg greater saphenous vein harvested endoscopically;  Surgeon: Ivin Poot, MD;  Location: [place];  Service: Open Heart Surgery;  Laterality: [name];   ENDARTERECTOMY Right 07/06/2016   Procedure: ENDARTERECTOMY CAROTID;  Surgeon: Rosetta Posner, MD;  Location[facility];  Service: Vascular;  Laterality: Right;   ENDARTERECTOMY Right 04/27/2020   Procedure: REDO OF RIGHT ENDARTERECTOMY CAROTID;  Surgeon: Rosetta Posner, MD;  Location: [facility] OR;  Service: Vascular;  Laterality: Right;   LEFT HEART CATH AND CORS/GRAFTS ANGIOGRAPHY [name] 08/24/2020   Procedure: LEFT HEART CATH AND CORS/GRAFTS ANGIOGRAPHY;  Surgeon: Wellington Hampshire, MD;  Location: [place] [facility];  Service: Cardiovascular;  Laterality: [name];   PERIPHERAL VASCULAR CATHETERIZATION [name] 04/18/2016   Procedure: Renal Angiography;  Surgeon: Wellington Hampshire, MD;  Location: [place] [facility];  Service:  Cardiovascular;  Laterality: [name];   PERIPHERAL VASCULAR INTERVENTION Left 10/15/2018   Procedure: PERIPHERAL VASCULAR INTERVENTION;  Surgeon: Wellington Hampshire, MD;  Location: [place] [facility];  Service: Cardiovascular;  Laterality: Left;  Left superficial femoral   TEE WITHOUT CARDIOVERSION [name] 07/06/2016   Procedure: TRANSESOPHAGEAL ECHOCARDIOGRAM (TEE);  Surgeon: Ivin Poot, MD;  Location: [place];  Service: Open Heart Surgery;  Laterality: [name];   TONSILLECTOMY       Current Outpatient Medications  Medication Sig Dispense Refill   acetaminophen (TYLENOL) 325 MG tablet Take 1-2 tablets (325-650 mg total) by mouth every 4 (four) hours as needed for mild pain.     albuterol (PROVENTIL HFA;VENTOLIN HFA) 108 (90 Base) MCG/ACT inhaler Inhale 2 puffs into the lungs every 6 (six) hours as needed for wheezing. 1 Inhaler 0   aspirin 81 MG chewable tablet Chew 1 tablet (81 mg total) by mouth daily.     benztropine (COGENTIN) 0.5 MG tablet Take 1 tablet (0.5 mg total) by mouth at bedtime. 90 tablet 0   chlorproMAZINE (THORAZINE) 100 MG tablet TAKE 1 TABLET BY MOUTH EVERYDAY AT BEDTIME  30 tablet 2   clonazePAM (KLONOPIN) 0.5 MG tablet Take 1 tablet (0.5 mg total) by mouth 2 (two) times daily. 60 tablet 2   clopidogrel (PLAVIX) 75 MG tablet Take 1 tablet (75 mg total) by mouth daily. 30 tablet 0   Continuous Blood Gluc Sensor (DEXCOM G6 SENSOR) MISC 1 Dose by Does not apply route daily. 30 each 3   Continuous Blood Gluc Transmit (DEXCOM G6 TRANSMITTER) MISC 1 Units by Does not apply route daily. 1 each 3   [START ON 08/29/2021] cyanocobalamin (,VITAMIN B-12,) 1000 MCG/ML injection Inject 1 mL (1,000 mcg total) into the skin every 30 (thirty) days. 1 mL 0   Dulaglutide (TRULICITY) A999333 0000000 SOPN Inject 0.75 mg into the skin once a week. 6 mL 3   ezetimibe (ZETIA) 10 MG tablet Take 1 tablet (10 mg total) by mouth daily. 30 tablet 0   FLUoxetine HCl 60 MG TABS Take 60 mg by mouth daily. 90 tablet 0    guaiFENesin (MUCINEX) 600 MG 12 hr tablet Take 1 tablet (600 mg total) by mouth 2 (two) times daily. 60 tablet 0   Incontinence Supply Disposable (DEPEND ADJUSTABLE UNDERWEAR LG) MISC 1 Units by Does not apply route daily. 30 each 3   insulin glargine-yfgn (SEMGLEE, YFGN,) 100 UNIT/ML Pen Inject 54 Units into the skin every morning. 60 mL 3   Insulin Syringe-Needle U-100 29G X 1/2" 0.3 ML MISC 1 each by Does not apply route 2 (two) times daily. 30 each 0   isosorbide mononitrate (IMDUR) 60 MG 24 hr tablet Take 1 tablet (60 mg total) by mouth 2 (two) times daily. 60 tablet 0   lamoTRIgine (LAMICTAL) 200 MG tablet Take 1 tablet (200 mg total) by mouth at bedtime. 90 tablet 0   naloxone (NARCAN) nasal spray 4 mg/0.1 mL Place 1 spray into the nose as needed for up to 365 doses (for opioid-induced respiratory depresssion). In case of emergency (overdose), spray once into each nostril. If no response within 3 minutes, repeat application and call [id number]. 1 each 0   nicotine (NICODERM CQ - DOSED IN MG/24 HOURS) 21 mg/24hr patch Place 1 patch (21 mg total) onto the skin daily. 30 patch 0   nitroGLYCERIN (NITROSTAT) 0.4 MG SL tablet Place 1 tablet (0.4 mg total) under the tongue every 5 (five) minutes as needed for chest pain. 25 tablet 1   ondansetron (ZOFRAN) 4 MG tablet Take 1 tablet (4 mg total) by mouth daily as needed for nausea or vomiting. 20 tablet 11   oxyCODONE (OXY IR/ROXICODONE) 5 MG immediate release tablet Take 1 tablet (5 mg total) by mouth daily as needed for severe pain. Must last 30 days. 30 tablet 0   pantoprazole (PROTONIX) 40 MG tablet Take 1 tablet (40 mg total) by mouth daily at 12 noon. 30 tablet 0   polyethylene glycol (MIRALAX / GLYCOLAX) 17 g packet Take 17 g by mouth 2 (two) times daily. 60 each 0   potassium chloride (KLOR-CON) 20 MEQ packet Take 20 mEq by mouth daily. 30 packet 0   pregabalin (LYRICA) 225 MG capsule Take 1 capsule (225 mg total) by mouth 2 (two) times daily. 180  capsule 1   rosuvastatin (CRESTOR) 20 MG tablet Take 1 tablet (20 mg total) by mouth daily. 30 tablet 0   senna-docusate (SENOKOT-S) 8.6-50 MG tablet Take 2 tablets by mouth 2 (two) times daily. 120 tablet 0   spironolactone (ALDACTONE) 25 MG tablet Take 1 tablet (25 mg total) by  mouth daily. 30 tablet 0   torsemide (DEMADEX) 10 MG tablet Take 1 tablet (10 mg total) by mouth daily. Decreased from twice a day. 30 tablet 0   vitamin B-12 1000 MCG tablet Take 1 tablet (1,000 mcg total) by mouth daily.     Vitamin D, Ergocalciferol, (DRISDOL) 1.25 MG (50000 UNIT) CAPS capsule Take 1 capsule (50,000 Units total) by mouth every 7 (seven) days. 5 capsule 0   No current facility-administered medications for this visit.    Allergies:   Chantix [varenicline tartrate]    Social History:  The patient  reports that she has been smoking cigarettes. She has a 13.50 pack-year smoking history. She has never used smokeless tobacco. She reports that she does not currently use alcohol. She reports that she does not use drugs.   Family History:  The patient's family history includes Alcohol abuse in her father; Anxiety disorder in her sister; Diabetes in her father and mother; Drug abuse in her father; Heart disease in her father; Stroke in her sister. She was adopted.    ROS:  Please see the history of present illness.   Otherwise, review of systems are positive for none.   All other systems are reviewed and negative.    PHYSICAL EXAM: VS:  BP (!) 162/96 (BP Location: Right Arm, Patient Position: Sitting, Cuff Size: Normal)   Pulse 91   Ht '5\' 6"'$  (1.676 m)   Wt 213 lb (96.6 kg)   SpO2 94%   BMI 34.38 kg/m  , BMI Body mass index is 34.38 kg/m. GEN: Well nourished, well developed, in no acute distress  HEENT: normal  Neck: no JVD, carotid bruits, or masses Cardiac: RRR; no  rubs, or gallops,no edema .  No murmurs. Respiratory:  clear to auscultation bilaterally, normal work of breathing GI: soft,  nontender, nondistended, + BS MS: no deformity or atrophy  Skin: warm and dry, no rash Neuro:  Strength and sensation are intact Psych: euthymic mood, full affect   EKG:  EKG is  ordered today. EKG showed normal sinus rhythm with possible old septal infarct.    Recent Labs: 06/24/2021: B Natriuretic Peptide 33.3 06/28/2021: TSH 1.950 07/10/2021: ALT 23; Magnesium 1.9 07/31/2021: BUN 15; Creatinine, Ser 1.01; Hemoglobin 12.7; Platelets 325; Potassium 3.8; Sodium 138    Lipid Panel    Component Value Date/Time   CHOL 202 (H) 06/25/2021 0740   CHOL 121 08/21/2016 0817   TRIG 636 (H) 06/25/2021 0740   HDL 29 (L) 06/25/2021 0740   HDL 24 (L) 08/21/2016 0817   CHOLHDL 7.0 06/25/2021 0740   VLDL UNABLE TO CALCULATE IF TRIGLYCERIDE OVER 400 mg/dL 06/25/2021 0740   LDLCALC UNABLE TO CALCULATE IF TRIGLYCERIDE OVER 400 mg/dL 06/25/2021 0740   LDLCALC 64 08/21/2016 0817   LDLDIRECT 99.6 (H) 06/25/2021 0740      Wt Readings from Last 3 Encounters:  08/22/21 213 lb (96.6 kg)  08/16/21 204 lb 3.2 oz (92.6 kg)  08/10/21 205 lb (93 kg)         ASSESSMENT AND PLAN:  1.  Coronary artery disease involving native coronary arteries with stable angina: Status post CABG in July of 2017.  Cardiac catheterization last year showed progression of native coronary artery disease but patent grafts.  Continue medical therapy.  2. Peripheral arterial disease with severe bilateral calf claudication with bilateral SFA occlusion: She has stable bilateral calf claudication.  Symptoms now seem to be worse on the right side given that she is favoring the  right side due to left-sided weakness related to stroke.   3. Carotid artery disease status post right carotid endarterectomy.  Status post right carotid endarterectomy twice on the right.  Followed by Dr. Donnetta Hutching   4. Essential hypertension: Blood pressure was running relatively low during her stroke but now is going up again.  She was on high dose carvedilol  and I elected to resume carvedilol at a lower dose of 6.25 mg twice daily.  5. Hyperlipidemia: She is currently on rosuvastatin and Zetia.  She was on Repatha before but that was discontinued during her hospitalization.  I requested a follow-up lipid and liver profile to see if we need to resume Repatha.   6. Tobacco use: I congratulated her on smoking cessation.  7.  Chronic diastolic heart failure: She appears to be euvolemic.  We will obtain a follow-up basic metabolic profile.    Disposition:   FU with me in 3 months   Signed,  [name], MD  08/22/2021 3:54 PM    [place]

## 2021-08-22 NOTE — Therapy (Signed)
[place] MAIN [facility] SERVICES [address] [place], [place], [address] Phone: [phone]   Fax:  [phone]  Speech Language Pathology Treatment  Patient Details  Name: Erika [name] MRN: [id number] Date of Birth: 06/18/1971 Referring Provider (SLP): Ranell Patrick Clide Deutscher, MD   Encounter Date: 08/21/2021   End of Session - 08/22/21 0935     Visit Number 4    Number of Visits 20    Date for SLP Re-Evaluation 11/02/21    Authorization Type [employer] - 20 visits per calendar year for OT, PT, and ST combined. counts as 1 visit if same day    Authorization - Visit Number 4    Authorization - Number of Visits 20    Progress Note Due on Visit 10    SLP Start Time 1515    SLP Stop Time  1600    SLP Time Calculation (min) 45 min             Past Medical History:  Diagnosis Date   Arthralgia of temporomandibular joint    CAD, multiple vessel    a. 06/2016 Cath: ostLM 40%, ostLAD 40%, pLAD 95%, ost-pLCx 60%, pLCx 95%, mLCx 60%, mRCA 95%, D2 50%, LVSF nl;  b. 07/2016 CABG x 4 (LIMA->LAD, VG->Diag, VG->OM, VG->RCA); c. 08/2016 Cath: 3VD w/ 4/4 patent grafts. LAD distal to LIMA has diff dzs->Med rx; d. 08/2020 Cath: 4/4 patent grafts, native 3VD. EF 55-65%-->Med Rx.   Carotid arterial disease ([place])    a. 07/2016 s/p R CEA; b. 02/2021 U/S: RICA 123456, LICA 123456.   Clotting disorder ([place])    Depression    Diastolic dysfunction    a. 06/2016 Echo: EF 50-55%, mild inf wall HK, GR1DD, mild MR, RV sys fxn nl, mildly dilated LA, PASP nl   Fatty liver disease, nonalcoholic Q000111Q   History of blood transfusion    with heart surgery   HLD (hyperlipidemia)    Labile hypertension    a. prior renal ngiogram negative for RAS in 03/2016; b. catecholamines and metanephrines normal, mildly elevated renin with normal aldosterone and normal ratio in 02/2016   Myocardial infarction [facility]) 2017   Obesity    PAD (peripheral artery disease) ([place])    a. 09/2018 s/p L  SFA stenting; b. 07/2019 Periph Angio: Patent m/d L SFA stent w/ 100% L SFA distal to stent. L AT 100d, L Peroneal diff dzs-->Med Rx; c. 02/2021 ABIs: stable @ 0.61 on R and 0.46 on L.   PTSD (post-traumatic stress disorder)    Tobacco abuse    Type 2 diabetes mellitus ([place]) 12/2015    Past Surgical History:  Procedure Laterality Date   ABDOMINAL AORTOGRAM W/LOWER EXTREMITY [name] 10/15/2018   Procedure: ABDOMINAL AORTOGRAM W/LOWER EXTREMITY;  Surgeon: Wellington Hampshire, MD;  Location: [place] CV LAB;  Service: Cardiovascular;  Laterality: [name];   ABDOMINAL AORTOGRAM W/LOWER EXTREMITY Bilateral 08/19/2019   Procedure: ABDOMINAL AORTOGRAM W/LOWER EXTREMITY;  Surgeon: Wellington Hampshire, MD;  Location: [place] CV LAB;  Service: Cardiovascular;  Laterality: Bilateral;   CARDIAC CATHETERIZATION [name] 06/29/2016   Procedure: Left Heart Cath and Coronary Angiography;  Surgeon: Minna Merritts, MD;  Location: [place] CV LAB;  Service: Cardiovascular;  Laterality: [name];   CARDIAC CATHETERIZATION [name] 08/29/2016   Procedure: Left Heart Cath and Cors/Grafts Angiography;  Surgeon: Wellington Hampshire, MD;  Location: [place] CV LAB;  Service: Cardiovascular;  Laterality: [name];   CESAREAN SECTION     CHOLECYSTECTOMY  CORONARY ARTERY BYPASS GRAFT [name] 07/06/2016   Procedure: CORONARY ARTERY BYPASS GRAFTING (CABG) x four, using left internal mammary artery and right leg greater saphenous vein harvested endoscopically;  Surgeon: Ivin Poot, MD;  Location: [place];  Service: Open Heart Surgery;  Laterality: [name];   ENDARTERECTOMY Right 07/06/2016   Procedure: ENDARTERECTOMY CAROTID;  Surgeon: Rosetta Posner, MD;  Location: [facility] OR;  Service: Vascular;  Laterality: Right;   ENDARTERECTOMY Right 04/27/2020   Procedure: REDO OF RIGHT ENDARTERECTOMY CAROTID;  Surgeon: Rosetta Posner, MD;  Location: [facility] OR;  Service: Vascular;  Laterality: Right;   LEFT HEART CATH AND CORS/GRAFTS ANGIOGRAPHY [name] 08/24/2020   Procedure:  LEFT HEART CATH AND CORS/GRAFTS ANGIOGRAPHY;  Surgeon: Wellington Hampshire, MD;  Location: [place] CV LAB;  Service: Cardiovascular;  Laterality: [name];   PERIPHERAL VASCULAR CATHETERIZATION [name] 04/18/2016   Procedure: Renal Angiography;  Surgeon: Wellington Hampshire, MD;  Location: [place] CV LAB;  Service: Cardiovascular;  Laterality: [name];   PERIPHERAL VASCULAR INTERVENTION Left 10/15/2018   Procedure: PERIPHERAL VASCULAR INTERVENTION;  Surgeon: Wellington Hampshire, MD;  Location: [place] CV LAB;  Service: Cardiovascular;  Laterality: Left;  Left superficial femoral   TEE WITHOUT CARDIOVERSION [name] 07/06/2016   Procedure: TRANSESOPHAGEAL ECHOCARDIOGRAM (TEE);  Surgeon: Ivin Poot, MD;  Location: [place];  Service: Open Heart Surgery;  Laterality: [name];   TONSILLECTOMY      There were no vitals filed for this visit.   Subjective Assessment - 08/22/21 0921     Subjective "I didn't tell him I fell"    Currently in Pain? Yes    Pain Score 4     Pain Location Shoulder    Pain Orientation Left                   ADULT SLP TREATMENT - 08/22/21 0922       General Information   Behavior/Cognition Alert;Cooperative    HPI Erika [name] is a 50 y.o. female admitted to [facility] 06/24/21 with slurred speech, left facial droop and LUE weakness; MRI showed acute R pontine CVA and chronic R parietal and occipital infarcts. Past medical history is noted for HTN, HLD, DM II, PAD, MI, CAD, s/p CABG, CHF, PTSD, adjustment disorder, obesity. Patient worked with [place], [place], [place] in [place] 06/28/21-08/01/21. SLP targeted cognitive deficits and dysarthria, as well as dysphagia; upgraded to regular/thin by time of d/c home.      Treatment Provided   Treatment provided Cognitive-Linquistic      Cognitive-Linquistic Treatment   Treatment focused on Cognition;Patient/family/caregiver education    Skilled Treatment Patient did not bring notebook today.  Provided new weekly planner; patient required moderate cues  for impulsivity and error awareness when filling in appropriate dates and appointment information.  Targeted attention, problem solving, and error awareness with simple time calculations for early/late appointment arrivals.  Patient's accuracy was 80% with moderate cues.  Patient verbalized deficit awareness, stating "My thinking's messed up."      Assessment / Recommendations / [place] with current plan of care      Progression Toward Goals   Progression toward goals Progressing toward goals              SLP Education - 08/22/21 0934     Education Details slow down    Person(s) Educated Patient    Methods Explanation    Comprehension Verbalized understanding  SLP Short Term Goals - 08/04/21 1246       SLP SHORT TERM GOAL #1   Title Pt will verbalize awareness of impulsivity in simple tasks >80% of the time with min cues.    Time 10    Period --   sessions   Status New      SLP SHORT TERM GOAL #2   Title Pt will ID out-of-turn responses >80% with non-verbal cues.    Time 10    Period --   sessions   Status New      SLP SHORT TERM GOAL #3   Title Pt will verbalize steps to safety for transfers/gait training to reduce impulsive/unsafe movement.    Time 10    Period --   sessions   Status New      SLP SHORT TERM GOAL #4   Title Pt will verbalize daily schedule, recent events and salient information using external memory aids.    Time 10    Period --   sessions   Status New      SLP SHORT TERM GOAL #5   Title Generate written or verbal cues to improve accuracy of 3 house hold tasks or safety.    Time 10    Period --   sessions   Status New              SLP Long Term Goals - 08/04/21 1334       SLP LONG TERM GOAL #1   Title Pt will verbalize or write steps prior to task initiation >80% with modified independence.    Time 12    Period Weeks    Status New    Target Date 11/01/21      SLP LONG TERM GOAL #2   Title Pt will  demonstrate appropriate turn-taking >90% of the time in 15 minutes mod complex conversation.    Time 12    Period Weeks    Status New    Target Date 11/01/21      SLP LONG TERM GOAL #3   Title Pt will use external aids to organize/recall appointments and medical information for herself and her daughter.    Time 12    Period Weeks    Status New    Target Date 11/01/21      SLP LONG TERM GOAL #4   Title Pt will plan, organize and present a 20-30 minute lesson on personal or gun safety using external aids/compensations    Time 12    Period Weeks    Status New    Target Date 11/01/21      SLP LONG TERM GOAL #5   Title Patient will use compensations for dysarthria in 20 minute mod complex conversation outside of [place] room for >95% intelligibility.    Time 12    Period Weeks    Status New    Target Date 11/01/21              Plan - 08/22/21 0935     Clinical Impression Statement Erika [name] presents with overall mild-moderate cognitive communication deficits with impairments in attention, memory, problem solving, language, and executive function. She also has mild dysarthria characterized by imprecise articulation impacting intelligibility at conversation level. 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 Recommend skilled ST to maximize cognitive and communication abilities to increase safety, independence, and life participation.    Speech Therapy Frequency 2x / week    Duration 12 weeks   20 visits allowed per calendar year per ins   Treatment/Interventions Language facilitation;Environmental controls;Cueing hierarchy;SLP instruction and feedback;Compensatory techniques;Cognitive reorganization;Functional  tasks;Compensatory strategies;Internal/external aids;Multimodal communcation approach;Patient/family education    Potential to Achieve Goals Good    SLP Home Exercise Plan notebook/binder for therapies    Consulted and Agree with Plan of Care Patient;Family member/caregiver             Patient will benefit from skilled therapeutic intervention in order to improve the following deficits and impairments:   Cognitive communication deficit  Dysarthria and anarthria    Problem List Patient Active Problem List   Diagnosis Date Noted   Cognitive communication deficit 08/10/2021   Urinary incontinence 08/10/2021   GERD (gastroesophageal reflux disease) 08/03/2021   Chronic post-traumatic stress disorder (PTSD)    Depression with anxiety    Right pontine stroke ([place]) 06/28/2021   Hemiplegia and hemiparesis following cerebral infarction affecting left non-dominant side ([place]) 06/24/2021   Chronic diastolic CHF (congestive heart failure) ([place]) 06/24/2021   Fall 06/24/2021   Abnormal LFTs 06/24/2021   Olecranon bursitis of left elbow 06/12/2021   Chronic hip pain (Bilateral) 06/09/2021   Chronic use of opiate for therapeutic purpose 05/09/2021   Greater trochanteric bursitis of hip (Left) 03/09/2021   Bursitis of hip (Left) [id number]   Uncomplicated opioid dependence ([place]) 02/20/2021   Acute conjunctivitis of left eye 01/02/2021   Unintentional weight loss 11/21/2020   Broken teeth (Right) 08/02/2020   History of MI (myocardial infarction) (July 2017) 08/02/2020   Neuropathy 06/09/2020   Dental abscess 06/09/2020   Foot drop (Left) 06/09/2020   Carotid stenosis, asymptomatic, right 04/27/2020   Chronic migraine 11/03/2019   Thrombocytosis 09/16/2019   Erythrocytosis 09/16/2019   Hypercalcemia 09/06/2019   Leukocytosis 09/06/2019   Polycythemia 09/06/2019   Chronic hip pain (Left) 08/27/2019   Osteoarthritis of hip (Left) 08/27/2019   Gluteal tendonitis of buttock (Left)  08/27/2019   Radial nerve palsy (Right) 07/15/2019   Neuropathy of radial nerve (Right) 06/30/2019   Abnormal bruising 06/25/2019   History of carotid endarterectomy (Right) 03/04/2019   Pain medication agreement signed 03/04/2019   Atypical facial pain (Right) 01/27/2019   Chronic ear pain (Right) 01/27/2019   Chronic jaw pain (Right) 01/27/2019   Geniculate Neuralgia (Right) 01/27/2019   Vitamin D deficiency 01/19/2019   Neurogenic pain 01/19/2019   Chronic anticoagulation (PLAVIX) 01/19/2019   Chronic pain syndrome 01/07/2019   Long term current use of opiate analgesic 01/07/2019   Long term prescription benzodiazepine use 01/07/2019   Pharmacologic therapy 01/07/2019   Disorder of skeletal system 01/07/2019   Problems influencing health status 01/07/2019   Chronic headaches (1ry area of Pain) (Right) 01/07/2019   Opiate use 09/24/2018   PVD (peripheral vascular disease) ([place]) 06/24/2018   Chronic ankle pain (Bilateral) 12/27/2017   Tendinopathy of gluteus medius (Right) 12/27/2017   Tendinopathy of gluteus medius (Left) 12/27/2017   Bilateral hip pain 12/26/2017   Chronic elbow pain (Left) 10/17/2017   Elevated troponin I level 05/21/2017   Major depressive disorder, recurrent episode, moderate (HCC) 11/19/2016   Insomnia 10/30/2016   Constipation 07/25/2016   S/P CABG x 4 07/06/2016   Bradycardia    CAD (coronary artery disease)    Carotid stenosis    CAD in native artery  06/29/2016   Elevated troponin 06/28/2016   Essential hypertension, malignant 06/28/2016   Mild tobacco abuse in early remission 06/28/2016   Essential hypertension    Malignant hypertension    Type 2 diabetes mellitus with other specified complication (HCC)    Chest pain with high risk for cardiac etiology 06/27/2016   NSTEMI (non-ST elevated myocardial infarction) ([place]) 06/27/2016   Proteinuria 03/15/2016   Renal artery stenosis ([place]) 03/15/2016   MDD (major depressive disorder) 10/17/2015    Agoraphobia with panic attacks 04/25/2015   HTN (hypertension), malignant 10/20/2013   Cluster headache 03/20/2012   HLD (hyperlipidemia) 07/26/2010   ADJUSTMENT DISORDER WITH MIXED FEATURES 07/26/2010   Erika [name], [place], [place] Speech-Language Pathologist Note: Portions of this document were prepared using Dragon voice recognition software and although reviewed may contain unintentional dictation errors in syntax, grammar, or spelling.  Aliene Altes 08/22/2021, 9:37 AM  [place] MAIN [facility] SERVICES [address] [place], [place], [address] Phone: [phone]   Fax:  [phone]   Name: Erika [name] MRN: [id number] Date of Birth: Nov 17, 1971

## 2021-08-22 NOTE — Patient Instructions (Signed)
Medication Instructions:  Your physician has recommended you make the following change in your medication:   START Carvedilol (coreg) 6.25 mg a day. An Rx has been sent to your pharmacy.  *If you need a refill on your cardiac medications before your next appointment, please call your pharmacy*   Lab Work: Your physician recommends that you return for a FASTING lipid profile and lft  If you have labs (blood work) drawn today and your tests are completely normal, you will receive your results only by: [place] (if you have MyChart) OR A paper copy in the mail If you have any lab test that is abnormal or we need to change your treatment, we will call you to review the results.   Testing/Procedures: None ordered   Follow-Up: At [facility], you and your health needs are our priority.  As part of our continuing mission to provide you with exceptional heart care, we have created designated Provider Care Teams.  These Care Teams include your primary Cardiologist (physician) and Advanced Practice Providers (APPs -  Physician Assistants and Nurse Practitioners) who all work together to provide you with the care you need, when you need it.  We recommend signing up for the patient portal called "MyChart".  Sign up information is provided on this After Visit Summary.  MyChart is used to connect with patients for Virtual Visits (Telemedicine).  Patients are able to view lab/test results, encounter notes, upcoming appointments, etc.  Non-urgent messages can be sent to your provider as well.   To learn more about what you can do with MyChart, go to [email or web address].    Your next appointment:   3 month(s)  The format for your next appointment:   In Person  Provider:   You may see [name], MD or one of the following Advanced Practice Providers on your designated Care Team:   Murray Hodgkins, NP Christell Faith, PA-C Marrianne Mood, PA-C Cadence Kathlen Mody, [place]   Other  Instructions [name]

## 2021-08-23 ENCOUNTER — Telehealth: Payer: Self-pay | Admitting: Cardiovascular Disease

## 2021-08-23 ENCOUNTER — Ambulatory Visit: Payer: Managed Care, Other (non HMO) | Admitting: Speech Pathology

## 2021-08-23 ENCOUNTER — Ambulatory Visit: Payer: Managed Care, Other (non HMO)

## 2021-08-23 ENCOUNTER — Other Ambulatory Visit: Payer: Self-pay

## 2021-08-23 DIAGNOSIS — R41841 Cognitive communication deficit: Secondary | ICD-10-CM

## 2021-08-23 DIAGNOSIS — M6281 Muscle weakness (generalized): Secondary | ICD-10-CM

## 2021-08-23 DIAGNOSIS — I69354 Hemiplegia and hemiparesis following cerebral infarction affecting left non-dominant side: Secondary | ICD-10-CM

## 2021-08-23 DIAGNOSIS — R278 Other lack of coordination: Secondary | ICD-10-CM

## 2021-08-23 DIAGNOSIS — R471 Dysarthria and anarthria: Secondary | ICD-10-CM

## 2021-08-23 NOTE — Therapy (Signed)
[name] [facility] SERVICES [address] [place], [place], [address] Phone: [phone]   Fax:  [phone]  Speech Language Pathology Treatment  Patient Details  Name: Erika [name] MRN: [id number] Date of Birth: [birth year] Referring Provider (SLP): Ranell Patrick Clide Deutscher, MD   Encounter Date: 08/23/2021   End of Session - 08/23/21 1736     Visit Number 5    Number of Visits 20    Date for SLP Re-Evaluation 11/02/21    Authorization Type [employer] - 20 visits per calendar year for OT, PT, and ST combined. counts as 1 visit if same day    Authorization - Visit Number 5    Authorization - Number of Visits 20    Progress Note Due on Visit 10    SLP Start Time 1500    SLP Stop Time  1600    SLP Time Calculation (min) 60 min    Activity Tolerance Patient tolerated treatment well             Past Medical History:  Diagnosis Date   Arthralgia of temporomandibular joint    CAD, multiple vessel    a. 06/2016 Cath: ostLM 40%, ostLAD 40%, pLAD 95%, ost-pLCx 60%, pLCx 95%, mLCx 60%, mRCA 95%, D2 50%, LVSF nl;  b. 07/2016 CABG x 4 (LIMA->LAD, VG->Diag, VG->OM, VG->RCA); c. 08/2016 Cath: 3VD w/ 4/4 patent grafts. LAD distal to LIMA has diff dzs->Med rx; d. 08/2020 Cath: 4/4 patent grafts, native 3VD. EF 55-65%-->Med Rx.   Carotid arterial disease ([place])    a. 07/2016 s/p R CEA; b. 02/2021 U/S: RICA 123456, LICA 123456.   Clotting disorder ([place])    Depression    Diastolic dysfunction    a. 06/2016 Echo: EF 50-55%, mild inf wall HK, GR1DD, mild MR, RV sys fxn nl, mildly dilated LA, PASP nl   Fatty liver disease, nonalcoholic Q000111Q   History of blood transfusion    with heart surgery   HLD (hyperlipidemia)    Labile hypertension    a. prior renal ngiogram negative for RAS in 03/2016; b. catecholamines and metanephrines normal, mildly elevated renin with normal aldosterone and normal ratio in 02/2016   Myocardial infarction [facility]) 2017   Obesity    PAD  (peripheral artery disease) ([place])    a. 09/2018 s/p L SFA stenting; b. 07/2019 Periph Angio: Patent m/d L SFA stent w/ 100% L SFA distal to stent. L AT 100d, L Peroneal diff dzs-->Med Rx; c. 02/2021 ABIs: stable @ 0.61 on R and 0.46 on L.   PTSD (post-traumatic stress disorder)    Tobacco abuse    Type 2 diabetes mellitus ([place]) 12/2015    Past Surgical History:  Procedure Laterality Date   ABDOMINAL AORTOGRAM W/LOWER EXTREMITY [name] 10/15/2018   Procedure: ABDOMINAL AORTOGRAM W/LOWER EXTREMITY;  Surgeon: Wellington Hampshire, MD;  Location: [place] [facility];  Service: Cardiovascular;  Laterality: [name];   ABDOMINAL AORTOGRAM W/LOWER EXTREMITY Bilateral 08/19/2019   Procedure: ABDOMINAL AORTOGRAM W/LOWER EXTREMITY;  Surgeon: Wellington Hampshire, MD;  Location: [place] [facility];  Service: Cardiovascular;  Laterality: Bilateral;   CARDIAC CATHETERIZATION [name] 06/29/2016   Procedure: Left Heart Cath and Coronary Angiography;  Surgeon: Minna Merritts, MD;  Location: [place] [facility];  Service: Cardiovascular;  Laterality: [name];   CARDIAC CATHETERIZATION [name] 08/29/2016   Procedure: Left Heart Cath and Cors/Grafts Angiography;  Surgeon: Wellington Hampshire, MD;  Location: [place] [facility];  Service: Cardiovascular;  Laterality: [name];  CESAREAN SECTION     CHOLECYSTECTOMY     CORONARY ARTERY BYPASS GRAFT [name] 07/06/2016   Procedure: CORONARY ARTERY BYPASS GRAFTING (CABG) x four, using left internal mammary artery and right leg greater saphenous vein harvested endoscopically;  Surgeon: Ivin Poot, MD;  Location: [place];  Service: Open Heart Surgery;  Laterality: [name];   ENDARTERECTOMY Right 07/06/2016   Procedure: ENDARTERECTOMY CAROTID;  Surgeon: Rosetta Posner, MD;  Location: [facility] OR;  Service: Vascular;  Laterality: Right;   ENDARTERECTOMY Right 04/27/2020   Procedure: REDO OF RIGHT ENDARTERECTOMY CAROTID;  Surgeon: Rosetta Posner, MD;  Location: [facility] OR;  Service: Vascular;  Laterality: Right;   LEFT HEART CATH  AND CORS/GRAFTS ANGIOGRAPHY [name] 08/24/2020   Procedure: LEFT HEART CATH AND CORS/GRAFTS ANGIOGRAPHY;  Surgeon: Wellington Hampshire, MD;  Location: [place] [facility];  Service: Cardiovascular;  Laterality: [name];   PERIPHERAL VASCULAR CATHETERIZATION [name] 04/18/2016   Procedure: Renal Angiography;  Surgeon: Wellington Hampshire, MD;  Location: [place] [facility];  Service: Cardiovascular;  Laterality: [name];   PERIPHERAL VASCULAR INTERVENTION Left 10/15/2018   Procedure: PERIPHERAL VASCULAR INTERVENTION;  Surgeon: Wellington Hampshire, MD;  Location: [place] [facility];  Service: Cardiovascular;  Laterality: Left;  Left superficial femoral   TEE WITHOUT CARDIOVERSION [name] 07/06/2016   Procedure: TRANSESOPHAGEAL ECHOCARDIOGRAM (TEE);  Surgeon: Ivin Poot, MD;  Location: [place];  Service: Open Heart Surgery;  Laterality: [name];   TONSILLECTOMY      There were no vitals filed for this visit.          ADULT SLP TREATMENT - 08/23/21 1732       General Information   Behavior/Cognition Alert;Cooperative    HPI Erika [name] is a 50 y.o. female admitted to [facility] 06/24/21 with slurred speech, left facial droop and LUE weakness; MRI showed acute R pontine CVA and chronic R parietal and occipital infarcts. Past medical history is noted for HTN, HLD, DM II, PAD, MI, CAD, s/p CABG, CHF, PTSD, adjustment disorder, obesity. Patient worked with [place], [place], [place] in [place] 06/28/21-08/01/21. SLP targeted cognitive deficits and dysarthria, as well as dysphagia; upgraded to regular/thin by time of d/c home.      Cognitive-Linquistic Treatment   Treatment focused on Cognition;Patient/family/caregiver education    Skilled Treatment In simple conversation regarding the topic of interest, patient demonstrated appropriate topic maintenance as well as turn-taking with nonverbal cues.  In written task, patient required usual cues to implement strategy to mark her place in the task.  Afterwards, patient was able to verbalize additional  strategies she could have used such as using a ruler.  In subsequent task following written directions, patient was noted to slow her rate, reread instructions, and use written cues to mark her place.  Educated patient and spouse on functional activities to target organization and sequencing at home.      Assessment / Recommendations / Plan   Plan Continue with current plan of care      Progression Toward Goals   Progression toward goals Progressing toward goals              SLP Education - 08/23/21 1736     Education Details Home tasks for cognitive linguistic exercise    Person(s) Educated Patient    Methods Explanation;Handout    Comprehension Verbalized understanding              SLP Short Term Goals - 08/04/21 1246       SLP SHORT TERM  GOAL #1   Title Pt will verbalize awareness of impulsivity in simple tasks >80% of the time with min cues.    Time 10    Period --   sessions   Status New      SLP SHORT TERM GOAL #2   Title Pt will ID out-of-turn responses >80% with non-verbal cues.    Time 10    Period --   sessions   Status New      SLP SHORT TERM GOAL #3   Title Pt will verbalize steps to safety for transfers/gait training to reduce impulsive/unsafe movement.    Time 10    Period --   sessions   Status New      SLP SHORT TERM GOAL #4   Title Pt will verbalize daily schedule, recent events and salient information using external memory aids.    Time 10    Period --   sessions   Status New      SLP SHORT TERM GOAL #5   Title Generate written or verbal cues to improve accuracy of 3 house hold tasks or safety.    Time 10    Period --   sessions   Status New              SLP Long Term Goals - 08/04/21 1334       SLP LONG TERM GOAL #1   Title Pt will verbalize or write steps prior to task initiation >80% with modified independence.    Time 12    Period Weeks    Status New    Target Date 11/01/21      SLP LONG TERM GOAL #2   Title Pt will  demonstrate appropriate turn-taking >90% of the time in 15 minutes mod complex conversation.    Time 12    Period Weeks    Status New    Target Date 11/01/21      SLP LONG TERM GOAL #3   Title Pt will use external aids to organize/recall appointments and medical information for herself and her daughter.    Time 12    Period Weeks    Status New    Target Date 11/01/21      SLP LONG TERM GOAL #4   Title Pt will plan, organize and present a 20-30 minute lesson on personal or gun safety using external aids/compensations    Time 12    Period Weeks    Status New    Target Date 11/01/21      SLP LONG TERM GOAL #5   Title Patient will use compensations for dysarthria in 20 minute mod complex conversation outside of [place] room for >95% intelligibility.    Time 12    Period Weeks    Status New    Target Date 11/01/21              Plan - 08/23/21 1736     Clinical Impression Statement Erika [name] presents with overall mild-moderate cognitive communication deficits and mild dysarthria. Remains impulsive but responsive to feedback and made attemtps to slow rate to improve her accuracy. 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 Recommend skilled ST to maximize cognitive and communication abilities to increase safety, independence, and life participation.    Speech Therapy Frequency 2x / week    Duration 12 weeks   20 visits allowed per calendar year per ins   Treatment/Interventions Language facilitation;Environmental controls;Cueing hierarchy;SLP instruction and feedback;Compensatory techniques;Cognitive reorganization;Functional tasks;Compensatory strategies;Internal/external aids;Multimodal communcation approach;Patient/family education  Potential to Achieve Goals Good    SLP Home Exercise Plan notebook/binder for therapies    Consulted and Agree with Plan of Care Patient;Family member/caregiver             Patient will benefit from skilled therapeutic  intervention in order to improve the following deficits and impairments:   Cognitive communication deficit  Dysarthria and anarthria    Problem List Patient Active Problem List   Diagnosis Date Noted   Cognitive communication deficit 08/10/2021   Urinary incontinence 08/10/2021   GERD (gastroesophageal reflux disease) 08/03/2021   Chronic post-traumatic stress disorder (PTSD)    Depression with anxiety    Right pontine stroke ([place]) 06/28/2021   Hemiplegia and hemiparesis following cerebral infarction affecting left non-dominant side ([place]) 06/24/2021   Chronic diastolic CHF (congestive heart failure) ([place]) 06/24/2021   Fall 06/24/2021   Abnormal LFTs 06/24/2021   Olecranon bursitis of left elbow 06/12/2021   Chronic hip pain (Bilateral) 06/09/2021   Chronic use of opiate for therapeutic purpose 05/09/2021   Greater trochanteric bursitis of hip (Left) 03/09/2021   Bursitis of hip (Left) [id number]   Uncomplicated opioid dependence ([place]) 02/20/2021   Acute conjunctivitis of left eye 01/02/2021   Unintentional weight loss 11/21/2020   Broken teeth (Right) 08/02/2020   History of MI (myocardial infarction) (July 2017) 08/02/2020   Neuropathy 06/09/2020   Dental abscess 06/09/2020   Foot drop (Left) 06/09/2020   Carotid stenosis, asymptomatic, right 04/27/2020   Chronic migraine 11/03/2019   Thrombocytosis 09/16/2019   Erythrocytosis 09/16/2019   Hypercalcemia 09/06/2019   Leukocytosis 09/06/2019   Polycythemia 09/06/2019   Chronic hip pain (Left) 08/27/2019   Osteoarthritis of hip (Left) 08/27/2019   Gluteal tendonitis of buttock (Left) 08/27/2019   Radial nerve palsy (Right) 07/15/2019   Neuropathy of radial nerve (Right) 06/30/2019   Abnormal bruising 06/25/2019   History of carotid endarterectomy (Right) 03/04/2019   Pain medication agreement signed 03/04/2019   Atypical facial pain (Right) 01/27/2019   Chronic ear pain (Right) 01/27/2019   Chronic jaw pain (Right)  01/27/2019   Geniculate Neuralgia (Right) 01/27/2019   Vitamin D deficiency 01/19/2019   Neurogenic pain 01/19/2019   Chronic anticoagulation (PLAVIX) 01/19/2019   Chronic pain syndrome 01/07/2019   Long term current use of opiate analgesic 01/07/2019   Long term prescription benzodiazepine use 01/07/2019   Pharmacologic therapy 01/07/2019   Disorder of skeletal system 01/07/2019   Problems influencing health status 01/07/2019   Chronic headaches (1ry area of Pain) (Right) 01/07/2019   Opiate use 09/24/2018   PVD (peripheral vascular disease) ([place]) 06/24/2018   Chronic ankle pain (Bilateral) 12/27/2017   Tendinopathy of gluteus medius (Right) 12/27/2017   Tendinopathy of gluteus medius (Left) 12/27/2017   Bilateral hip pain 12/26/2017   Chronic elbow pain (Left) 10/17/2017   Elevated troponin I level 05/21/2017   Major depressive disorder, recurrent episode, moderate (HCC) 11/19/2016   Insomnia 10/30/2016   Constipation 07/25/2016   S/P CABG x 4 07/06/2016   Bradycardia    CAD (coronary artery disease)    Carotid stenosis    CAD in native artery 06/29/2016   Elevated troponin 06/28/2016   Essential hypertension, malignant 06/28/2016   Mild tobacco abuse in early remission 06/28/2016   Essential hypertension    Malignant hypertension    Type 2 diabetes mellitus with other specified complication (HCC)    Chest pain with high risk for cardiac etiology 06/27/2016   NSTEMI (non-ST elevated myocardial infarction) ([place]) 06/27/2016   Proteinuria  03/15/2016   Renal artery stenosis ([place]) 03/15/2016   MDD (major depressive disorder) 10/17/2015   Agoraphobia with panic attacks 04/25/2015   HTN (hypertension), malignant 10/20/2013   Cluster headache 03/20/2012   HLD (hyperlipidemia) 07/26/2010   ADJUSTMENT DISORDER WITH MIXED FEATURES 07/26/2010   Deneise Lever, [place], [place] E Zanae Kuehnle 08/23/2021, 5:38 PM  [place] MAIN [facility] SERVICES [address] [place], [place], [address] Phone: [phone]   Fax:  [phone]   Name: Erika [name] MRN: [id number] Date of Birth: [birth year]

## 2021-08-23 NOTE — Therapy (Signed)
[place] MAIN [facility] SERVICES [address] [place], [place], [address] Phone: [phone]   Fax:  [phone]  Physical Therapy Treatment  Patient Details  Name: RUSTIE GILL MRN: [id number] Date of Birth: 08-18-1971 Referring Provider (PT): Leeroy Cha MD   Encounter Date: 08/23/2021   PT End of Session - 08/23/21 1401     Visit Number 6    Number of Visits 20    Date for PT Re-Evaluation 09/14/21    Authorization Type [employer] max visits 20x per year speech, OT, PT    Authorization Time Period 08/03/21-10/26/21    Progress Note Due on Visit 10    PT Start Time 1414    PT Stop Time 1459    PT Time Calculation (min) 45 min    Equipment Utilized During Treatment Gait belt;Other (comment)   L AFO   Activity Tolerance Patient tolerated treatment well    Behavior During Therapy [facility] for tasks assessed/performed;Flat affect;Impulsive             Past Medical History:  Diagnosis Date   Arthralgia of temporomandibular joint    CAD, multiple vessel    a. 06/2016 Cath: ostLM 40%, ostLAD 40%, pLAD 95%, ost-pLCx 60%, pLCx 95%, mLCx 60%, mRCA 95%, D2 50%, LVSF nl;  b. 07/2016 CABG x 4 (LIMA->LAD, VG->Diag, VG->OM, VG->RCA); c. 08/2016 Cath: 3VD w/ 4/4 patent grafts. LAD distal to LIMA has diff dzs->Med rx; d. 08/2020 Cath: 4/4 patent grafts, native 3VD. EF 55-65%-->Med Rx.   Carotid arterial disease ([place])    a. 07/2016 s/p R CEA; b. 02/2021 U/S: RICA 123456, LICA 123456.   Clotting disorder ([place])    Depression    Diastolic dysfunction    a. 06/2016 Echo: EF 50-55%, mild inf wall HK, GR1DD, mild MR, RV sys fxn nl, mildly dilated LA, PASP nl   Fatty liver disease, nonalcoholic Q000111Q   History of blood transfusion    with heart surgery   HLD (hyperlipidemia)    Labile hypertension    a. prior renal ngiogram negative for RAS in 03/2016; b. catecholamines and metanephrines normal, mildly elevated renin with normal aldosterone and normal ratio in  02/2016   Myocardial infarction [facility]) 2017   Obesity    PAD (peripheral artery disease) ([place])    a. 09/2018 s/p L SFA stenting; b. 07/2019 Periph Angio: Patent m/d L SFA stent w/ 100% L SFA distal to stent. L AT 100d, L Peroneal diff dzs-->Med Rx; c. 02/2021 ABIs: stable @ 0.61 on R and 0.46 on L.   PTSD (post-traumatic stress disorder)    Tobacco abuse    Type 2 diabetes mellitus ([place]) 12/2015    Past Surgical History:  Procedure Laterality Date   ABDOMINAL AORTOGRAM W/LOWER EXTREMITY [name] 10/15/2018   Procedure: ABDOMINAL AORTOGRAM W/LOWER EXTREMITY;  Surgeon: Wellington Hampshire, MD;  Location: [place] CV LAB;  Service: Cardiovascular;  Laterality: [name];   ABDOMINAL AORTOGRAM W/LOWER EXTREMITY Bilateral 08/19/2019   Procedure: ABDOMINAL AORTOGRAM W/LOWER EXTREMITY;  Surgeon: Wellington Hampshire, MD;  Location: [place] CV LAB;  Service: Cardiovascular;  Laterality: Bilateral;   CARDIAC CATHETERIZATION [name] 06/29/2016   Procedure: Left Heart Cath and Coronary Angiography;  Surgeon: Minna Merritts, MD;  Location: [place] CV LAB;  Service: Cardiovascular;  Laterality: [name];   CARDIAC CATHETERIZATION [name] 08/29/2016   Procedure: Left Heart Cath and Cors/Grafts Angiography;  Surgeon: Wellington Hampshire, MD;  Location: [place] CV LAB;  Service: Cardiovascular;  Laterality: [name];  CESAREAN SECTION     CHOLECYSTECTOMY     CORONARY ARTERY BYPASS GRAFT [name] 07/06/2016   Procedure: CORONARY ARTERY BYPASS GRAFTING (CABG) x four, using left internal mammary artery and right leg greater saphenous vein harvested endoscopically;  Surgeon: Ivin Poot, MD;  Location: [place];  Service: Open Heart Surgery;  Laterality: [name];   ENDARTERECTOMY Right 07/06/2016   Procedure: ENDARTERECTOMY CAROTID;  Surgeon: Rosetta Posner, MD;  Location: [facility] OR;  Service: Vascular;  Laterality: Right;   ENDARTERECTOMY Right 04/27/2020   Procedure: REDO OF RIGHT ENDARTERECTOMY CAROTID;  Surgeon: Rosetta Posner, MD;  Location: [facility] OR;   Service: Vascular;  Laterality: Right;   LEFT HEART CATH AND CORS/GRAFTS ANGIOGRAPHY [name] 08/24/2020   Procedure: LEFT HEART CATH AND CORS/GRAFTS ANGIOGRAPHY;  Surgeon: Wellington Hampshire, MD;  Location: [place] CV LAB;  Service: Cardiovascular;  Laterality: [name];   PERIPHERAL VASCULAR CATHETERIZATION [name] 04/18/2016   Procedure: Renal Angiography;  Surgeon: Wellington Hampshire, MD;  Location: [place] CV LAB;  Service: Cardiovascular;  Laterality: [name];   PERIPHERAL VASCULAR INTERVENTION Left 10/15/2018   Procedure: PERIPHERAL VASCULAR INTERVENTION;  Surgeon: Wellington Hampshire, MD;  Location: [place] CV LAB;  Service: Cardiovascular;  Laterality: Left;  Left superficial femoral   TEE WITHOUT CARDIOVERSION [name] 07/06/2016   Procedure: TRANSESOPHAGEAL ECHOCARDIOGRAM (TEE);  Surgeon: Ivin Poot, MD;  Location: [place];  Service: Open Heart Surgery;  Laterality: [name];   TONSILLECTOMY      There were no vitals filed for this visit.   Subjective Assessment - 08/23/21 1604     Subjective Patient reports compliance with HEP. Has been doing better with her sit to stands since last session.    Pertinent History Pt presented to [facility]24[facility]21 c c/c of slurred speech, L facial droop & LUE weakness. MRI revealed acute R pontine perforator infarct with chronic R parietal & occipital infarcts. PMH: HTN, HLD, DM, tobacco abuse, PAD, CAD, CABG, dCHF, peptic foot drop, renal artery stenosis, MI and CAD (s/p of R CEA 2017), PTSD, adjustment disorder, obesity, Left hip OA s/p OPPT 2020. Pt DC 06/28/21 to inpatient rehab in [place], [place] to home 08/01/21. AT CIR pt worked extensively on STS with MinA or Supervision and HW, Stand and squat pivot transfers with MinA, AMB progressed to ~52f max, mostly with HW (some RW use with fixed LUE on handle), but was mostly limited in further progression of distance 2/2 chronic PAD ischaemic pain or Left knee buckling despite AFO use. CIR recommending update to more supporting AFO at  transfer to OPPT. Since home, pt is using WC to navigate the environment, family assists with ADL performance, WC still needs to be modified to fit through BR doorframe. Pt reports not feeling safe with HW at present. Husband has been helping with gnelte stretching in mornings as pt sleeps in a flexion synergy posture on LLE.    Limitations Walking;Standing    How long can you sit comfortably? Generally well tolerated with occasional buttocks soreness    How long can you stand comfortably? At eval: tolerates <2 minutes prior to ischemic leg pain    How long can you walk comfortably? Longest distance at CIR last month ~258f    Currently in Pain? Yes    Pain Score 4     Pain Location Shoulder    Pain Orientation Left    Pain Descriptors / Indicators Aching    Pain Onset More than a month ago    Pain  Frequency Constant                   Neuro Re-ed: With mirror in front and hemiwalker in RUE -standing weight shift with min A from PT to shift pelvis towards the left 3x 60 seconds with green pad under RLE.  -march/lift RLE for weight shift onto LLE; min A to pelvis to shift onto LLE 12x -forward step and backwards step 10x each LEwith min A to pelvis to weight shift onto LLE; second set with LLE requires use of GTB on floor to encourage a wider step.   Standing reach without UE support inside/outside BOS x 3 minutes  Ambulate with hemi walker 6 ft, weave around cone and return to sitting on plinth table. Close CGA and cues for keeping wider BOS   Therapeutic Exercises:  Sit to stand reach for ball and throw at target x 15, frequent throwing to the left missing target and twisting body past midline LAQ for sequencing of muscle activation, timing, and reaction response x 15 ; challenging with timing of muscle recruitment  Hamstring isometric 10x 3 second holds   Hamstring lengthening stretch 60 seconds with leg on PT shoulder    Seated weight shifts left/right "booty bump" to  focus on hip shift rather than pushing with shoulders to shift. X2 minutes    Stand pivot transfer from w/c to/from plinth; cues for sequencing, placement of hemiwalker, foot placement x 2   Education provided throughout session via VC/TC and demonstration to facilitate movement at target joints and correct muscle activation for all testing and exercises performed.                        PT Education - 08/23/21 1400     Education Details exercise technique, body mechanics    Person(s) Educated Patient    Methods Explanation;Demonstration;Tactile cues;Verbal cues    Comprehension Verbalized understanding;Returned demonstration;Verbal cues required;Tactile cues required              PT Short Term Goals - 08/03/21 1645       PT SHORT TERM GOAL #1   Title Pt to demonstrate improved tolerance to standing >3 minutes without physical assist or LOB. DME ad lib    Baseline <2 minutes at eval    Time 4    Period Weeks    Status New    Target Date 08/31/21      PT SHORT TERM GOAL #2   Title Pt to demonstrate perfromance of 5xSTS c AFO/Hemi walker at supervision level independence without LOB or physical assist.    Baseline Needs minGuard to MinA to perform at eval    Time 4    Period Weeks    Status New    Target Date 08/31/21      PT SHORT TERM GOAL #3   Title Pt to demonstrate AMB ~90f at supervision level with AFO, HW without LOB.    Baseline [facility] eval [facility].    Time 6    Period Weeks    Status New    Target Date 09/14/21      PT SHORT TERM GOAL #4   Title Pt to demonstrate TUG test <120sec c HW and AFO at minguard - MinA level to improve independence with transfers and reduce falls risk at home.    Baseline No tug performed at eval:    Time 7    Period Weeks  Status New    Target Date 09/21/21               PT Long Term Goals - 08/03/21 1647       PT LONG TERM GOAL #1   Title Pt to demonstrate perfromance of  5xSTS c AFO/Hemi walker at supervision level independence without LOB or physical assist in less than 20sec.    Baseline Unable at eval, requires minguard to minA    Time 8    Period Weeks    Status New    Target Date 09/28/21      PT LONG TERM GOAL #2   Title Pt to demonstrate FOTO score improvement >10 points to demonstrate improved perception of ability in ADL    Baseline 23 at eval:    Time 8    Period Weeks    Status New    Target Date 09/28/21      PT LONG TERM GOAL #3   Title Pt able to demonstrate stand pivot transfer with HW and AFO without LOB at supervision level to modI.    Baseline Needs minGuard to MinA for safety    Time 8    Period Weeks    Status New    Target Date 09/28/21      PT LONG TERM GOAL #4   Title Pt to demonstrate TUG test <60sec c HW and AFO at supervision level to improve independence with transfers and reduce falls risk at home.    Baseline No Tug at [facility]    Time 12    Period Weeks    Status New    Target Date 10/26/21                   Plan - 08/23/21 1758     Clinical Impression Statement Patient is highly motivated throughout physical therapy session and is improving with her transfers and ability to weight shift onto affected LLE. She has significant challenge widening her BOS frequently attempting to move with a scissoring pattern which will continue to be an area of progress at this time. LLE spatial awareness and control does improve with visual cueing on floor. Pt will benefit from skilled PT evaluation to maximize safety, independence, and tolerance to basic moblity required for ADL performance and accessing the community and to reduce risk of falls in the home.    Personal Factors and Comorbidities Age;Comorbidity 3+;Fitness;Past/Current Experience;Time since onset of injury/illness/exacerbation    Comorbidities DM, HTN, PVD, clotting disorder, CAD    Examination-Activity Limitations Squat;Stairs;Bathing;Bed  Mobility;Dressing;Reach Overhead;Hygiene/Grooming;Bend;Carry;Locomotion Level;Transfers;Toileting;Stand    Examination-Participation Restrictions Cleaning;Community Activity;Occupation;Laundry;Driving;Yard Work    [employer] Evolving/Moderate complexity    Rehab Potential Good    PT Frequency 2x / week    PT Duration 12 weeks    PT Treatment/Interventions Electrical Stimulation;Gait training;Stair training;Functional mobility training;Therapeutic activities;Therapeutic exercise;Balance training;Neuromuscular re-education;Patient/family education;Manual techniques;Wheelchair mobility training;Energy conservation;Passive range of motion;Orthotic Fit/Training;DME Instruction;ADLs/Self Care Home Management    PT Next Visit Plan work toward supervision level STS and SPT c HW, sustained standing tolerance.    PT Home Exercise Plan Not updated this visit- Encouraged seated LE strengthening and practicing gait with husband.    Consulted and Agree with Plan of Care Patient;Family member/caregiver    Family Member Consulted husband             Patient will benefit from skilled therapeutic intervention in order to improve the following deficits and impairments:  Decreased endurance, Decreased mobility, Difficulty walking, Hypomobility, Decreased  balance, Abnormal gait, Increased muscle spasms, Cardiopulmonary status limiting activity, Impaired flexibility, Hypermobility, Decreased strength, Decreased activity tolerance  Visit Diagnosis: Other lack of coordination  Hemiplegia and hemiparesis following cerebral infarction affecting left non-dominant side (HCC)  Muscle weakness (generalized)     Problem List Patient Active Problem List   Diagnosis Date Noted   Cognitive communication deficit 08/10/2021   Urinary incontinence 08/10/2021   GERD (gastroesophageal reflux disease) 08/03/2021   Chronic post-traumatic stress disorder (PTSD)    Depression with anxiety    Right  pontine stroke ([place]) 06/28/2021   Hemiplegia and hemiparesis following cerebral infarction affecting left non-dominant side ([place]) 06/24/2021   Chronic diastolic CHF (congestive heart failure) ([place]) 06/24/2021   Fall 06/24/2021   Abnormal LFTs 06/24/2021   Olecranon bursitis of left elbow 06/12/2021   Chronic hip pain (Bilateral) 06/09/2021   Chronic use of opiate for therapeutic purpose 05/09/2021   Greater trochanteric bursitis of hip (Left) 03/09/2021   Bursitis of hip (Left) Q000111Q   Uncomplicated opioid dependence ([place]) 02/20/2021   Acute conjunctivitis of left eye 01/02/2021   Unintentional weight loss 11/21/2020   Broken teeth (Right) 08/02/2020   History of MI (myocardial infarction) (July 2017) 08/02/2020   Neuropathy 06/09/2020   Dental abscess 06/09/2020   Foot drop (Left) 06/09/2020   Carotid stenosis, asymptomatic, right 04/27/2020   Chronic migraine 11/03/2019   Thrombocytosis 09/16/2019   Erythrocytosis 09/16/2019   Hypercalcemia 09/06/2019   Leukocytosis 09/06/2019   Polycythemia 09/06/2019   Chronic hip pain (Left) 08/27/2019   Osteoarthritis of hip (Left) 08/27/2019   Gluteal tendonitis of buttock (Left) 08/27/2019   Radial nerve palsy (Right) 07/15/2019   Neuropathy of radial nerve (Right) 06/30/2019   Abnormal bruising 06/25/2019   History of carotid endarterectomy (Right) 03/04/2019   Pain medication agreement signed 03/04/2019   Atypical facial pain (Right) 01/27/2019   Chronic ear pain (Right) 01/27/2019   Chronic jaw pain (Right) 01/27/2019   Geniculate Neuralgia (Right) 01/27/2019   Vitamin D deficiency 01/19/2019   Neurogenic pain 01/19/2019   Chronic anticoagulation (PLAVIX) 01/19/2019   Chronic pain syndrome 01/07/2019   Long term current use of opiate analgesic 01/07/2019   Long term prescription benzodiazepine use 01/07/2019   Pharmacologic therapy 01/07/2019   Disorder of skeletal system 01/07/2019   Problems influencing health status  01/07/2019   Chronic headaches (1ry area of Pain) (Right) 01/07/2019   Opiate use 09/24/2018   PVD (peripheral vascular disease) ([place]) 06/24/2018   Chronic ankle pain (Bilateral) 12/27/2017   Tendinopathy of gluteus medius (Right) 12/27/2017   Tendinopathy of gluteus medius (Left) 12/27/2017   Bilateral hip pain 12/26/2017   Chronic elbow pain (Left) 10/17/2017   Elevated troponin I level 05/21/2017   Major depressive disorder, recurrent episode, moderate (HCC) 11/19/2016   Insomnia 10/30/2016   Constipation 07/25/2016   S/P CABG x 4 07/06/2016   Bradycardia    CAD (coronary artery disease)    Carotid stenosis    CAD in native artery 06/29/2016   Elevated troponin 06/28/2016   Essential hypertension, malignant 06/28/2016   Mild tobacco abuse in early remission 06/28/2016   Essential hypertension    Malignant hypertension    Type 2 diabetes mellitus with other specified complication (HCC)    Chest pain with high risk for cardiac etiology 06/27/2016   NSTEMI (non-ST elevated myocardial infarction) ([place]) 06/27/2016   Proteinuria 03/15/2016   Renal artery stenosis ([place]) 03/15/2016   MDD (major depressive disorder) 10/17/2015   Agoraphobia with panic attacks 04/25/2015  HTN (hypertension), malignant 10/20/2013   Cluster headache 03/20/2012   HLD (hyperlipidemia) 07/26/2010   ADJUSTMENT DISORDER WITH MIXED FEATURES 07/26/2010    Janna Arch, PT, DPT  08/23/2021, 5:59 PM  [name] MAIN [facility] SERVICES [address] [place], [place], [address] Phone: [phone]   Fax:  [phone]  Name: RAELANI NIWA MRN: [id number] Date of Birth: [birth year]

## 2021-08-23 NOTE — Therapy (Signed)
[place] MAIN [facility] SERVICES [address] [place], [place], [address] Phone: [phone]   Fax:  [phone]  Occupational Therapy Treatment  Patient Details  Name: Erika [name] MRN: [id number] Date of Birth: 10-18-1971 Referring Provider (OT): Dr. Leeroy Cha   Encounter Date: 08/23/2021   OT End of Session - 08/23/21 1704     Visit Number 6    Number of Visits 20    Date for OT Re-Evaluation 10/25/21    Authorization Type limited to 20 visits combined/multidiscipline    Authorization Time Period Reporting period starting 08/03/2021    OT Start Time 1605    OT Stop Time 1651    OT Time Calculation (min) 46 min    Equipment Utilized During Treatment wc    Activity Tolerance Patient tolerated treatment well    Behavior During Therapy [facility] for tasks assessed/performed;Flat affect;Impulsive             Past Medical History:  Diagnosis Date   Arthralgia of temporomandibular joint    CAD, multiple vessel    a. 06/2016 Cath: ostLM 40%, ostLAD 40%, pLAD 95%, ost-pLCx 60%, pLCx 95%, mLCx 60%, mRCA 95%, D2 50%, LVSF nl;  b. 07/2016 CABG x 4 (LIMA->LAD, VG->Diag, VG->OM, VG->RCA); c. 08/2016 Cath: 3VD w/ 4/4 patent grafts. LAD distal to LIMA has diff dzs->Med rx; d. 08/2020 Cath: 4/4 patent grafts, native 3VD. EF 55-65%-->Med Rx.   Carotid arterial disease ([place])    a. 07/2016 s/p R CEA; b. 02/2021 U/S: RICA 123456, LICA 123456.   Clotting disorder ([place])    Depression    Diastolic dysfunction    a. 06/2016 Echo: EF 50-55%, mild inf wall HK, GR1DD, mild MR, RV sys fxn nl, mildly dilated LA, PASP nl   Fatty liver disease, nonalcoholic Q000111Q   History of blood transfusion    with heart surgery   HLD (hyperlipidemia)    Labile hypertension    a. prior renal ngiogram negative for RAS in 03/2016; b. catecholamines and metanephrines normal, mildly elevated renin with normal aldosterone and normal ratio in 02/2016   Myocardial infarction [facility])  2017   Obesity    PAD (peripheral artery disease) ([place])    a. 09/2018 s/p L SFA stenting; b. 07/2019 Periph Angio: Patent m/d L SFA stent w/ 100% L SFA distal to stent. L AT 100d, L Peroneal diff dzs-->Med Rx; c. 02/2021 ABIs: stable @ 0.61 on R and 0.46 on L.   PTSD (post-traumatic stress disorder)    Tobacco abuse    Type 2 diabetes mellitus ([place]) 12/2015    Past Surgical History:  Procedure Laterality Date   ABDOMINAL AORTOGRAM W/LOWER EXTREMITY [name] 10/15/2018   Procedure: ABDOMINAL AORTOGRAM W/LOWER EXTREMITY;  Surgeon: Wellington Hampshire, MD;  Location: [place] CV LAB;  Service: Cardiovascular;  Laterality: [name];   ABDOMINAL AORTOGRAM W/LOWER EXTREMITY Bilateral 08/19/2019   Procedure: ABDOMINAL AORTOGRAM W/LOWER EXTREMITY;  Surgeon: Wellington Hampshire, MD;  Location: [place] CV LAB;  Service: Cardiovascular;  Laterality: Bilateral;   CARDIAC CATHETERIZATION [name] 06/29/2016   Procedure: Left Heart Cath and Coronary Angiography;  Surgeon: Minna Merritts, MD;  Location: [place] CV LAB;  Service: Cardiovascular;  Laterality: [name];   CARDIAC CATHETERIZATION [name] 08/29/2016   Procedure: Left Heart Cath and Cors/Grafts Angiography;  Surgeon: Wellington Hampshire, MD;  Location: [place] CV LAB;  Service: Cardiovascular;  Laterality: [name];   CESAREAN SECTION     CHOLECYSTECTOMY     CORONARY ARTERY  BYPASS GRAFT [name] 07/06/2016   Procedure: CORONARY ARTERY BYPASS GRAFTING (CABG) x four, using left internal mammary artery and right leg greater saphenous vein harvested endoscopically;  Surgeon: Ivin Poot, MD;  Location: [place];  Service: Open Heart Surgery;  Laterality: [name];   ENDARTERECTOMY Right 07/06/2016   Procedure: ENDARTERECTOMY CAROTID;  Surgeon: Rosetta Posner, MD;  Location: [facility] OR;  Service: Vascular;  Laterality: Right;   ENDARTERECTOMY Right 04/27/2020   Procedure: REDO OF RIGHT ENDARTERECTOMY CAROTID;  Surgeon: Rosetta Posner, MD;  Location: [facility] OR;  Service: Vascular;  Laterality:  Right;   LEFT HEART CATH AND CORS/GRAFTS ANGIOGRAPHY [name] 08/24/2020   Procedure: LEFT HEART CATH AND CORS/GRAFTS ANGIOGRAPHY;  Surgeon: Wellington Hampshire, MD;  Location: [place] CV LAB;  Service: Cardiovascular;  Laterality: [name];   PERIPHERAL VASCULAR CATHETERIZATION [name] 04/18/2016   Procedure: Renal Angiography;  Surgeon: Wellington Hampshire, MD;  Location: [place] CV LAB;  Service: Cardiovascular;  Laterality: [name];   PERIPHERAL VASCULAR INTERVENTION Left 10/15/2018   Procedure: PERIPHERAL VASCULAR INTERVENTION;  Surgeon: Wellington Hampshire, MD;  Location: [place] CV LAB;  Service: Cardiovascular;  Laterality: Left;  Left superficial femoral   TEE WITHOUT CARDIOVERSION [name] 07/06/2016   Procedure: TRANSESOPHAGEAL ECHOCARDIOGRAM (TEE);  Surgeon: Ivin Poot, MD;  Location: [place];  Service: Open Heart Surgery;  Laterality: [name];   TONSILLECTOMY      There were no vitals filed for this visit.   Subjective Assessment - 08/23/21 1700     Subjective  Pt reports difficulty getting comfortable when sleeping.    Patient is accompanied by: Family member    Pertinent History HX of CAD, CABG, DM2, s/p R pontine stroke    Limitations L sided hemiplegia, impulsivity, judgment, safety awareness    Patient Stated Goals "I want to be able to take care of myself."    Currently in Pain? Yes    Pain Score 8     Pain Location Head    Pain Orientation Other (Comment)   cluster headaches   Pain Descriptors / Indicators Aching    Pain Type Acute pain    Pain Onset More than a month ago    Pain Frequency Constant    Pain Relieving Factors pain meds    Multiple Pain Sites Yes    Pain Score 4    Pain Location Arm    Pain Orientation Left    Pain Descriptors / Indicators Aching    Pain Type Chronic pain    Pain Frequency Intermittent           Occupational Therapy Treatment: Neuro re-ed: Performed bilat shoulder shrugs and retraction x10 each.  In supine, performed L hand squeezes with  consecutive passive stretching of L hand digits for tone reduction.  Performed active assisted L elbow flexion 2 sets 10, several reps pt was able to actively bend elbow against gravity with only CGA to control forearm within ROM arc.  Practiced active assisted L shoulder adduction and IR with forearm to abdomen, with goal to work towards stabilizing/holding ADL supplies.  Performed AAROM for L shoulder flex/horiz abd/add, and elbow extension with facilitation techniques to stim tricep.    Self care: Instructed in supportive positioning strategies for LUE in supine and side lying L/R.  Spouse able to return demo of positioning strategies.  Encouraged pt to avoid total side lying on L shoulder as to avoid injury d/t subluxed shoulder.  Pt pleased with positioning strategies and verbalized increased comfort,  no pain in elbow or shoulder when arm was supported by pillows.  Instructed pt in removing and adjusting wc leg rests in prep for transfers; able to return demo with repeated trials, but further training needed.    Response to Treatment: See Plan/clinical impression below.       OT Education - 08/23/21 1703     Education Details LUE proximal AROM/AAROM, supportive positioning strategies for bed    Person(s) Educated Patient;Spouse    Methods Explanation;Demonstration;Verbal cues    Comprehension Verbalized understanding;Returned demonstration;Need further instruction;Verbal cues required              OT Short Term Goals - 08/04/21 0914       OT SHORT TERM GOAL #1   Title Pt will be perform HEP for LUE with min vc.    Baseline Eval: Initiated at eval, further instruction needed    Time 6    Period Weeks    Status New    Target Date 09/13/21               OT Long Term Goals - 08/04/21 0915       OT LONG TERM GOAL #1   Title Pt will increase FOTO score to 40 or better to indicate increased functional performance    Baseline Eval: 23    Time 12    Period Weeks     Status New    Target Date 10/25/21      OT LONG TERM GOAL #2   Title Pt will perform UB ADLs with set up    Baseline Eval: Mod A to perform UB ADLs    Time 12    Period Weeks    Status New    Target Date 10/25/21      OT LONG TERM GOAL #3   Title Pt will perform LB ADLs with min A    Baseline Eval: Max A to perform LB ADLs    Time 12    Period Weeks    Status New    Target Date 10/25/21      OT LONG TERM GOAL #4   Title Pt will perform ADL transfers with distant supv    Baseline Eval: min A-mod A to perform ADL transfers    Time 12    Period Weeks    Status New    Target Date 10/25/21      OT LONG TERM GOAL #5   Title Pt will increase LUE strength by 2 MM grades to work towards use of LUE as a stabilizer for self care.    Baseline Eval: L shoulder 1/5, elbow/wrist/hand 0/5.  Unable to use LUE as a stabilzer for self care.    Time 12    Period Weeks    Status New    Target Date 10/25/21                   Plan - 08/23/21 1718     Clinical Impression Statement Pt/spouse with good return demo of supportive positioning strategies to reduce pain throughout LUE in supine or side lying and pt reported increased comfort with these strategies.  Noted increased muscle firing throughout LUE, with pt able to flex L elbow against gravity with only CGA, was able to grip a soft ball for several seconds before dropping, and was able to perform L active assisted adduction.  Encouraged continued elbow flex/humeral adduction, and IR to work towards holding/stabilizing objects ?at torso.  Pt will benefit from  continued Occupational Therapy to address ADL decline and LUE hemiplegia in order to maximize indep with ADLs/IADLs//leisure.    OT Occupational Profile and History Detailed Assessment- Review of Records and additional review of physical, cognitive, psychosocial history related to current functional performance    Occupational performance deficits (Please refer to evaluation for  details): ADL's;IADL's;Leisure;Work    Marketing executive / Function / Physical Skills ADL;Continence;Dexterity;Strength;Vision;Balance;Coordination;FMC;IADL;Body mechanics;Endurance;Gait;UE functional use;Decreased knowledge of use of DME;GMC;Mobility    Rehab Potential Good    Clinical Decision Making Several treatment options, min-mod task modification necessary    Comorbidities Affecting Occupational Performance: May have comorbidities impacting occupational performance    Modification or Assistance to Complete Evaluation  Min-Moderate modification of tasks or assist with assess necessary to complete eval    OT Frequency 2x / week    OT Duration 12 weeks    OT Treatment/Interventions Self-care/ADL training;Therapeutic exercise;DME and/or AE instruction;Functional Mobility Training;Balance training;Electrical Stimulation;Neuromuscular education;Manual Therapy;Splinting;Visual/perceptual remediation/compensation;Moist Heat;Passive range of motion;Therapeutic activities;Patient/family education    Consulted and Agree with Plan of Care Patient;Family member/caregiver             Patient will benefit from skilled therapeutic intervention in order to improve the following deficits and impairments:   Body Structure / Function / Physical Skills: ADL, Continence, Dexterity, Strength, Vision, Balance, Coordination, [place], IADL, Body mechanics, Endurance, Gait, UE functional use, Decreased knowledge of use of DME, [place], Mobility       Visit Diagnosis: Other lack of coordination  Hemiplegia and hemiparesis following cerebral infarction affecting left non-dominant side (HCC)  Muscle weakness (generalized)    Problem List Patient Active Problem List   Diagnosis Date Noted   Cognitive communication deficit 08/10/2021   Urinary incontinence 08/10/2021   GERD (gastroesophageal reflux disease) 08/03/2021   Chronic post-traumatic stress disorder (PTSD)    Depression with anxiety    Right pontine  stroke ([place]) 06/28/2021   Hemiplegia and hemiparesis following cerebral infarction affecting left non-dominant side ([place]) 06/24/2021   Chronic diastolic CHF (congestive heart failure) ([place]) 06/24/2021   Fall 06/24/2021   Abnormal LFTs 06/24/2021   Olecranon bursitis of left elbow 06/12/2021   Chronic hip pain (Bilateral) 06/09/2021   Chronic use of opiate for therapeutic purpose 05/09/2021   Greater trochanteric bursitis of hip (Left) 03/09/2021   Bursitis of hip (Left) Q000111Q   Uncomplicated opioid dependence ([place]) 02/20/2021   Acute conjunctivitis of left eye 01/02/2021   Unintentional weight loss 11/21/2020   Broken teeth (Right) 08/02/2020   History of MI (myocardial infarction) (July 2017) 08/02/2020   Neuropathy 06/09/2020   Dental abscess 06/09/2020   Foot drop (Left) 06/09/2020   Carotid stenosis, asymptomatic, right 04/27/2020   Chronic migraine 11/03/2019   Thrombocytosis 09/16/2019   Erythrocytosis 09/16/2019   Hypercalcemia 09/06/2019   Leukocytosis 09/06/2019   Polycythemia 09/06/2019   Chronic hip pain (Left) 08/27/2019   Osteoarthritis of hip (Left) 08/27/2019   Gluteal tendonitis of buttock (Left) 08/27/2019   Radial nerve palsy (Right) 07/15/2019   Neuropathy of radial nerve (Right) 06/30/2019   Abnormal bruising 06/25/2019   History of carotid endarterectomy (Right) 03/04/2019   Pain medication agreement signed 03/04/2019   Atypical facial pain (Right) 01/27/2019   Chronic ear pain (Right) 01/27/2019   Chronic jaw pain (Right) 01/27/2019   Geniculate Neuralgia (Right) 01/27/2019   Vitamin D deficiency 01/19/2019   Neurogenic pain 01/19/2019   Chronic anticoagulation (PLAVIX) 01/19/2019   Chronic pain syndrome 01/07/2019   Long term current use of opiate analgesic 01/07/2019  Long term prescription benzodiazepine use 01/07/2019   Pharmacologic therapy 01/07/2019   Disorder of skeletal system 01/07/2019   Problems influencing health status 01/07/2019    Chronic headaches (1ry area of Pain) (Right) 01/07/2019   Opiate use 09/24/2018   PVD (peripheral vascular disease) ([place]) 06/24/2018   Chronic ankle pain (Bilateral) 12/27/2017   Tendinopathy of gluteus medius (Right) 12/27/2017   Tendinopathy of gluteus medius (Left) 12/27/2017   Bilateral hip pain 12/26/2017   Chronic elbow pain (Left) 10/17/2017   Elevated troponin I level 05/21/2017   Major depressive disorder, recurrent episode, moderate (HCC) 11/19/2016   Insomnia 10/30/2016   Constipation 07/25/2016   S/P CABG x 4 07/06/2016   Bradycardia    CAD (coronary artery disease)    Carotid stenosis    CAD in native artery 06/29/2016   Elevated troponin 06/28/2016   Essential hypertension, malignant 06/28/2016   Mild tobacco abuse in early remission 06/28/2016   Essential hypertension    Malignant hypertension    Type 2 diabetes mellitus with other specified complication ([place])    Chest pain with high risk for cardiac etiology 06/27/2016   NSTEMI (non-ST elevated myocardial infarction) ([place]) 06/27/2016   Proteinuria 03/15/2016   Renal artery stenosis ([place]) 03/15/2016   MDD (major depressive disorder) 10/17/2015   Agoraphobia with panic attacks 04/25/2015   HTN (hypertension), malignant 10/20/2013   Cluster headache 03/20/2012   HLD (hyperlipidemia) 07/26/2010   ADJUSTMENT DISORDER WITH MIXED FEATURES 07/26/2010   Leta Speller, MS, OTR/L  Darleene Cleaver 08/23/2021, 5:19 PM  [place] MAIN [facility] SERVICES [place], [place], [address] Phone: [phone]   Fax:  [phone]  Name: Erika [name] MRN: [id number] Date of Birth: 11-07-1971

## 2021-08-23 NOTE — Telephone Encounter (Signed)
Per Checkout fasting labs and 3 m fu needed .  Attempted to schedule.

## 2021-08-24 NOTE — Telephone Encounter (Signed)
Pharmacy calling in and waiting for an update

## 2021-08-25 ENCOUNTER — Other Ambulatory Visit (HOSPITAL_COMMUNITY): Payer: Self-pay | Admitting: Psychiatry

## 2021-08-25 ENCOUNTER — Other Ambulatory Visit: Payer: Self-pay | Admitting: Cardiovascular Disease

## 2021-08-25 ENCOUNTER — Inpatient Hospital Stay: Payer: Managed Care, Other (non HMO) | Admitting: Physical Medicine and Rehabilitation

## 2021-08-25 ENCOUNTER — Other Ambulatory Visit: Payer: Self-pay | Admitting: Physical Medicine & Rehabilitation

## 2021-08-25 DIAGNOSIS — F331 Major depressive disorder, recurrent, moderate: Secondary | ICD-10-CM

## 2021-08-26 IMAGING — DX DG CHEST 1V PORT
1 series · 1 of 1 positions shown · non-contrast
Comparison: Portable chest 07/06/2021 and CT 07/03/2021.

CLINICAL DATA: 49-year-old female follow-up pneumonia.

EXAM:
PORTABLE CHEST 1 VIEW

[chest ap]
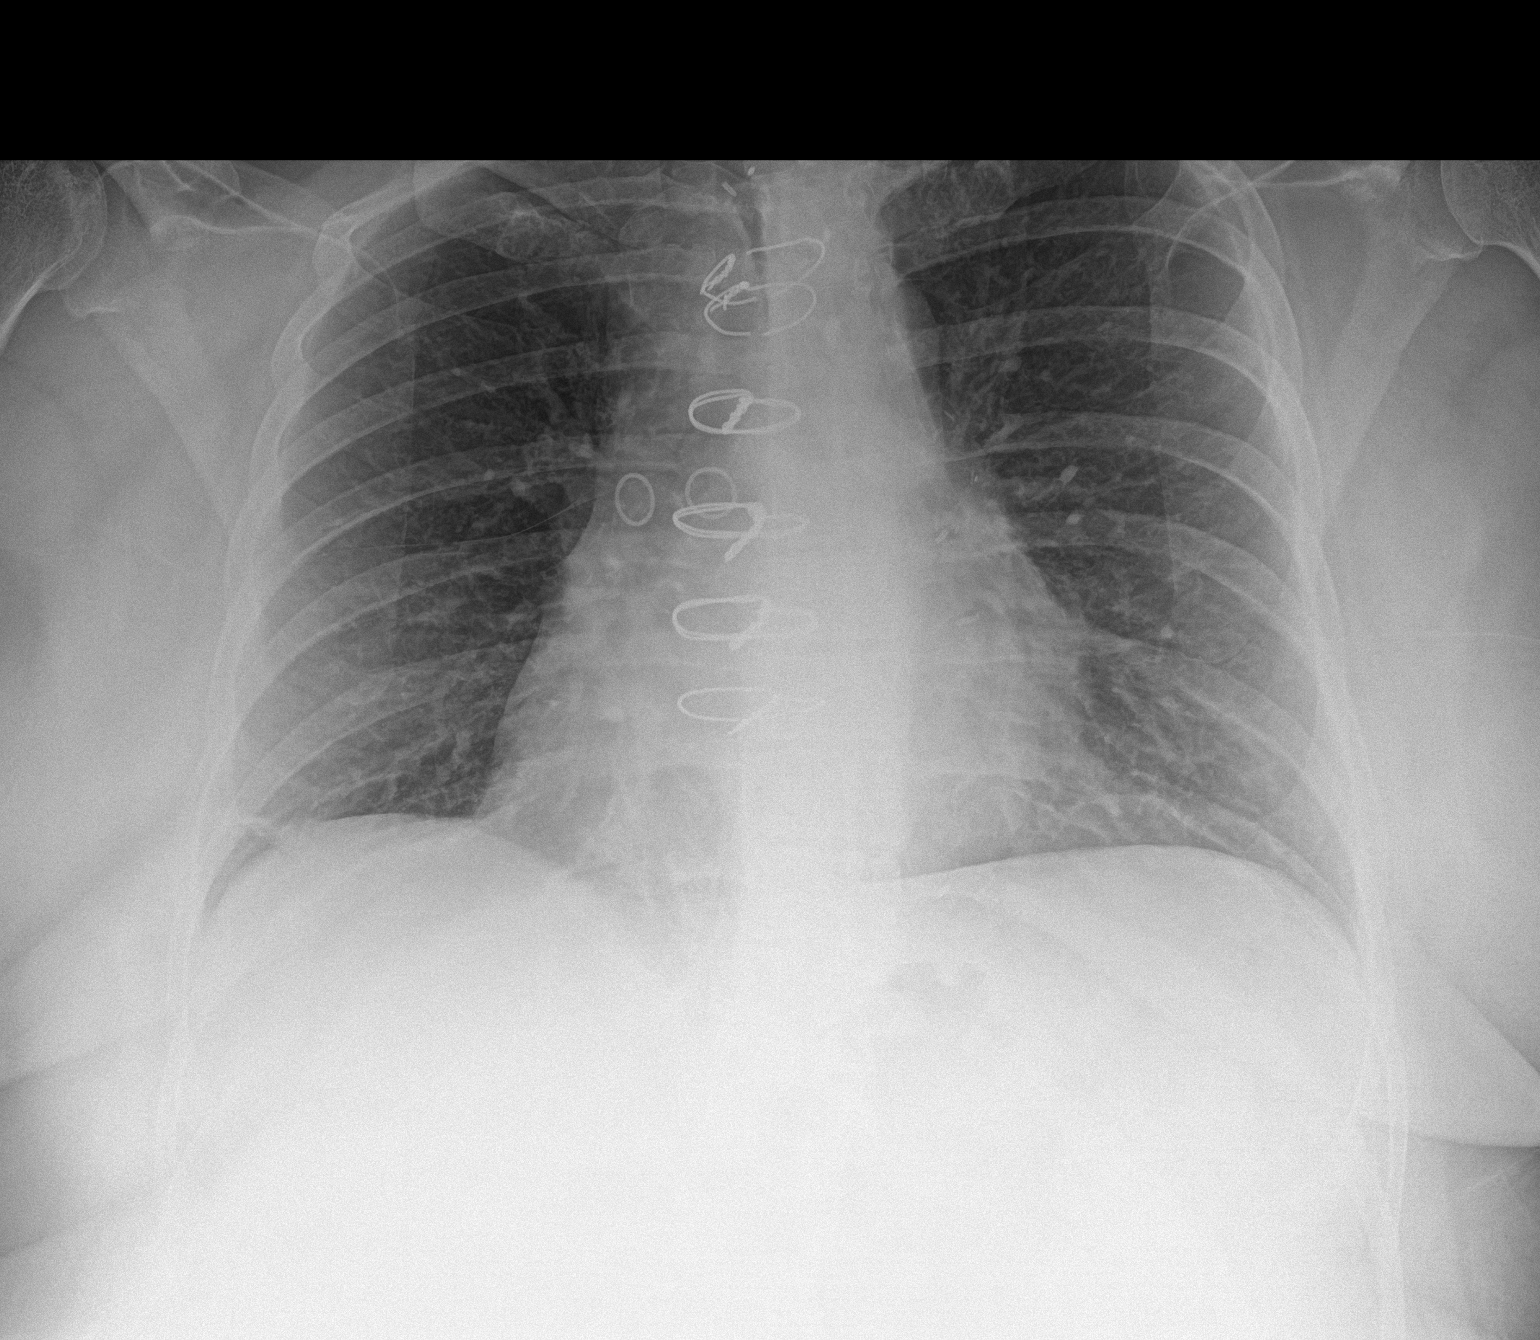

[1 of 1 positions shown; findings below may reference images not displayed]

FINDINGS: Portable AP semi upright view at [id number] hours. Larger lung volumes.
Mildly rotated to the right. Mediastinal contours remain normal.
Prior CABG. Visualized tracheal air column is within normal limits.
No pneumothorax, pulmonary edema or pleural effusion. Streaky
opacity at the right lung base appears regressed since 07/03/2021.
No areas of worsening ventilation. Paucity of bowel gas in the upper
abdomen. No acute osseous abnormality identified.
IMPRESSION: 1. Regressed but not fully resolved streaky opacity at the right
lung base since 07/03/2021 compatible with regression of infection
and/or atelectasis.
2. No new cardiopulmonary abnormality.

## 2021-08-29 ENCOUNTER — Other Ambulatory Visit: Payer: Self-pay | Admitting: Physical Medicine and Rehabilitation

## 2021-08-29 ENCOUNTER — Ambulatory Visit: Payer: Managed Care, Other (non HMO)

## 2021-08-29 ENCOUNTER — Ambulatory Visit: Payer: Managed Care, Other (non HMO) | Attending: Physical Medicine and Rehabilitation | Admitting: Speech Pathology

## 2021-08-29 ENCOUNTER — Other Ambulatory Visit: Payer: Self-pay

## 2021-08-29 ENCOUNTER — Encounter: Payer: Self-pay | Admitting: Occupational Therapy

## 2021-08-29 ENCOUNTER — Ambulatory Visit: Payer: Managed Care, Other (non HMO) | Admitting: Occupational Therapy

## 2021-08-29 DIAGNOSIS — R2689 Other abnormalities of gait and mobility: Secondary | ICD-10-CM | POA: Diagnosis present

## 2021-08-29 DIAGNOSIS — M6289 Other specified disorders of muscle: Secondary | ICD-10-CM

## 2021-08-29 DIAGNOSIS — R262 Difficulty in walking, not elsewhere classified: Secondary | ICD-10-CM | POA: Insufficient documentation

## 2021-08-29 DIAGNOSIS — M6281 Muscle weakness (generalized): Secondary | ICD-10-CM | POA: Diagnosis present

## 2021-08-29 DIAGNOSIS — R471 Dysarthria and anarthria: Secondary | ICD-10-CM | POA: Diagnosis present

## 2021-08-29 DIAGNOSIS — R41841 Cognitive communication deficit: Secondary | ICD-10-CM | POA: Diagnosis not present

## 2021-08-29 DIAGNOSIS — I69354 Hemiplegia and hemiparesis following cerebral infarction affecting left non-dominant side: Secondary | ICD-10-CM | POA: Insufficient documentation

## 2021-08-29 DIAGNOSIS — R2681 Unsteadiness on feet: Secondary | ICD-10-CM | POA: Diagnosis present

## 2021-08-29 DIAGNOSIS — R269 Unspecified abnormalities of gait and mobility: Secondary | ICD-10-CM | POA: Insufficient documentation

## 2021-08-29 DIAGNOSIS — R278 Other lack of coordination: Secondary | ICD-10-CM | POA: Diagnosis present

## 2021-08-29 NOTE — Therapy (Signed)
[place] MAIN [facility] SERVICES [address] [place], [place], [address] Phone: [phone]   Fax:  [phone]  Occupational Therapy Treatment  Patient Details  Name: Erika [name] MRN: [id number] Date of Birth: 09/24/1971 Referring Provider (OT): Dr. Leeroy Cha   Encounter Date: 08/29/2021   OT End of Session - 08/29/21 1548     Visit Number 7    Number of Visits 20    Date for OT Re-Evaluation 10/25/21    Authorization Type limited to 20 visits combined/multidiscipline    Authorization Time Period Reporting period starting 08/03/2021    OT Start Time 1345    OT Stop Time 1430    OT Time Calculation (min) 45 min    Activity Tolerance Patient tolerated treatment well    Behavior During Therapy [facility] for tasks assessed/performed;Flat affect;Impulsive             Past Medical History:  Diagnosis Date   Arthralgia of temporomandibular joint    CAD, multiple vessel    a. 06/2016 Cath: ostLM 40%, ostLAD 40%, pLAD 95%, ost-pLCx 60%, pLCx 95%, mLCx 60%, mRCA 95%, D2 50%, LVSF nl;  b. 07/2016 CABG x 4 (LIMA->LAD, VG->Diag, VG->OM, VG->RCA); c. 08/2016 Cath: 3VD w/ 4/4 patent grafts. LAD distal to LIMA has diff dzs->Med rx; d. 08/2020 Cath: 4/4 patent grafts, native 3VD. EF 55-65%-->Med Rx.   Carotid arterial disease ([place])    a. 07/2016 s/p R CEA; b. 02/2021 U/S: RICA 123456, LICA 123456.   Clotting disorder ([place])    Depression    Diastolic dysfunction    a. 06/2016 Echo: EF 50-55%, mild inf wall HK, GR1DD, mild MR, RV sys fxn nl, mildly dilated LA, PASP nl   Fatty liver disease, nonalcoholic Q000111Q   History of blood transfusion    with heart surgery   HLD (hyperlipidemia)    Labile hypertension    a. prior renal ngiogram negative for RAS in 03/2016; b. catecholamines and metanephrines normal, mildly elevated renin with normal aldosterone and normal ratio in 02/2016   Myocardial infarction [facility]) 2017   Obesity    PAD (peripheral artery  disease) ([place])    a. 09/2018 s/p L SFA stenting; b. 07/2019 Periph Angio: Patent m/d L SFA stent w/ 100% L SFA distal to stent. L AT 100d, L Peroneal diff dzs-->Med Rx; c. 02/2021 ABIs: stable @ 0.61 on R and 0.46 on L.   PTSD (post-traumatic stress disorder)    Tobacco abuse    Type 2 diabetes mellitus ([place]) 12/2015    Past Surgical History:  Procedure Laterality Date   ABDOMINAL AORTOGRAM W/LOWER EXTREMITY [name] 10/15/2018   Procedure: ABDOMINAL AORTOGRAM W/LOWER EXTREMITY;  Surgeon: Wellington Hampshire, MD;  Location: [place] [facility];  Service: Cardiovascular;  Laterality: [name];   ABDOMINAL AORTOGRAM W/LOWER EXTREMITY Bilateral 08/19/2019   Procedure: ABDOMINAL AORTOGRAM W/LOWER EXTREMITY;  Surgeon: Wellington Hampshire, MD;  Location: [place] [facility];  Service: Cardiovascular;  Laterality: Bilateral;   CARDIAC CATHETERIZATION [name] 06/29/2016   Procedure: Left Heart Cath and Coronary Angiography;  Surgeon: Minna Merritts, MD;  Location: [place] [facility];  Service: Cardiovascular;  Laterality: [name];   CARDIAC CATHETERIZATION [name] 08/29/2016   Procedure: Left Heart Cath and Cors/Grafts Angiography;  Surgeon: Wellington Hampshire, MD;  Location: [place] [facility];  Service: Cardiovascular;  Laterality: [name];   CESAREAN SECTION     CHOLECYSTECTOMY     CORONARY ARTERY BYPASS GRAFT [name] 07/06/2016   Procedure: CORONARY  ARTERY BYPASS GRAFTING (CABG) x four, using left internal mammary artery and right leg greater saphenous vein harvested endoscopically;  Surgeon: Ivin Poot, MD;  Location: [place];  Service: Open Heart Surgery;  Laterality: [name];   ENDARTERECTOMY Right 07/06/2016   Procedure: ENDARTERECTOMY CAROTID;  Surgeon: Rosetta Posner, MD;  Location: [facility] OR;  Service: Vascular;  Laterality: Right;   ENDARTERECTOMY Right 04/27/2020   Procedure: REDO OF RIGHT ENDARTERECTOMY CAROTID;  Surgeon: Rosetta Posner, MD;  Location: [facility] OR;  Service: Vascular;  Laterality: Right;   LEFT HEART CATH AND CORS/GRAFTS  ANGIOGRAPHY [name] 08/24/2020   Procedure: LEFT HEART CATH AND CORS/GRAFTS ANGIOGRAPHY;  Surgeon: Wellington Hampshire, MD;  Location: [place] [facility];  Service: Cardiovascular;  Laterality: [name];   PERIPHERAL VASCULAR CATHETERIZATION [name] 04/18/2016   Procedure: Renal Angiography;  Surgeon: Wellington Hampshire, MD;  Location: [place] [facility];  Service: Cardiovascular;  Laterality: [name];   PERIPHERAL VASCULAR INTERVENTION Left 10/15/2018   Procedure: PERIPHERAL VASCULAR INTERVENTION;  Surgeon: Wellington Hampshire, MD;  Location: [place] [facility];  Service: Cardiovascular;  Laterality: Left;  Left superficial femoral   TEE WITHOUT CARDIOVERSION [name] 07/06/2016   Procedure: TRANSESOPHAGEAL ECHOCARDIOGRAM (TEE);  Surgeon: Ivin Poot, MD;  Location: [place];  Service: Open Heart Surgery;  Laterality: [name];   TONSILLECTOMY      There were no vitals filed for this visit.   Subjective Assessment - 08/29/21 1547     Subjective  Pt reports difficulty getting comfortable when sleeping.    Patient is accompanied by: Family member    Pertinent History HX of CAD, CABG, DM2, s/p R pontine stroke    Limitations L sided hemiplegia, impulsivity, judgment, safety awareness    Currently in Pain? No/denies            OT TREATMENT     Therapeutic Exercise:   Pt. tolerated PROM in all joint ranges for left shoulder flexion, extension, abduction, horizontal abduction, and adduction, elbow flexion, extension, wrist flexion, and extension, thumb abduction. Pt. performed AAROM for hand to face patterns. Pt. Worked on PNF diagonal movement patterns.  Neuromuscular re-education:  Pt. worked on alternating weightbearing through her LUE, and hand with facilitation of the wrist, and digit extensors.     Manual Therapy:   Pt. tolerated scapular mobilizations for elevation, depression, abduction/rotation.  Pt. tolerated retrograde massage, and soft tissue mobilizations for metacarpal spread stretches to the left hand  secondary to increased edema in the left hand. Manual therapy was performed independent of, and in preparation for ther. Ex.   Pt. reports 8/10 cluster headaches. Pt. required CGA to transfer from the mat to the w/c with CGA. Cues were required for body position in space. Pt. tolerated ROM, and manual therapy well with decreased edema noted in the left hand following intervention.  Pt. was able to demonstrate self-ROM to the left hand using the right hand with visual, verbal, and tactile cues. Pt. education was provided about finding opportunities to engage the LUE as a gross assist during daily activities. Pt. continues to work on improving and maximizing independence with ADLs, and IADL tasks.                                OT Education - 08/29/21 1547     Education Details LUE proximal AROM/AAROM, supportive positioning strategies for bed    Person(s) Educated Patient;Spouse    Methods Explanation;Demonstration;Verbal  cues    Comprehension Verbalized understanding;Returned demonstration;Need further instruction;Verbal cues required              OT Short Term Goals - 08/04/21 0914       OT SHORT TERM GOAL #1   Title Pt will be perform HEP for LUE with min vc.    Baseline Eval: Initiated at eval, further instruction needed    Time 6    Period Weeks    Status New    Target Date 09/13/21               OT Long Term Goals - 08/04/21 0915       OT LONG TERM GOAL #1   Title Pt will increase FOTO score to 40 or better to indicate increased functional performance    Baseline Eval: 23    Time 12    Period Weeks    Status New    Target Date 10/25/21      OT LONG TERM GOAL #2   Title Pt will perform UB ADLs with set up    Baseline Eval: Mod A to perform UB ADLs    Time 12    Period Weeks    Status New    Target Date 10/25/21      OT LONG TERM GOAL #3   Title Pt will perform LB ADLs with min A    Baseline Eval: Max A to perform LB ADLs    Time 12     Period Weeks    Status New    Target Date 10/25/21      OT LONG TERM GOAL #4   Title Pt will perform ADL transfers with distant supv    Baseline Eval: min A-mod A to perform ADL transfers    Time 12    Period Weeks    Status New    Target Date 10/25/21      OT LONG TERM GOAL #5   Title Pt will increase LUE strength by 2 MM grades to work towards use of LUE as a stabilizer for self care.    Baseline Eval: L shoulder 1/5, elbow/wrist/hand 0/5.  Unable to use LUE as a stabilzer for self care.    Time 12    Period Weeks    Status New    Target Date 10/25/21                   Plan - 08/29/21 1548     Clinical Impression Statement Pt. reports 8/10 cluster headaches. Pt. required CGA to transfer from the mat to the w/c with CGA. Cues were required for body position in space. Pt. tolerated ROM, and manual therapy well with decreased edema noted in the left hand following intervention.  Pt. was able to demonstrate self-ROM to the left hand using the right hand with visual, verbal, and tactile cues. Pt. education was provided about finding opportunities to engage the LUE as a gross assist during daily activities. Pt. continues to work on improving and maximizing independence with ADLs, and IADL tasks.   OT Occupational Profile and History Detailed Assessment- Review of Records and additional review of physical, cognitive, psychosocial history related to current functional performance    Occupational performance deficits (Please refer to evaluation for details): ADL's;IADL's;Leisure;Work    Marketing executive / Function / Physical Skills ADL;Continence;Dexterity;Strength;Vision;Balance;Coordination;FMC;IADL;Body mechanics;Endurance;Gait;UE functional use;Decreased knowledge of use of DME;GMC;Mobility    Rehab Potential Good    Clinical Decision Making Several treatment options, min-mod task  modification necessary    Comorbidities Affecting Occupational Performance: May have comorbidities  impacting occupational performance    Modification or Assistance to Complete Evaluation  Min-Moderate modification of tasks or assist with assess necessary to complete eval    OT Frequency 2x / week    OT Duration 12 weeks    OT Treatment/Interventions Self-care/ADL training;Therapeutic exercise;DME and/or AE instruction;Functional Mobility Training;Balance training;Electrical Stimulation;Neuromuscular education;Manual Therapy;Splinting;Visual/perceptual remediation/compensation;Moist Heat;Passive range of motion;Therapeutic activities;Patient/family education    Consulted and Agree with Plan of Care Patient;Family member/caregiver    Family Member Consulted spouse, [name]             Patient will benefit from skilled therapeutic intervention in order to improve the following deficits and impairments:   Body Structure / Function / Physical Skills: ADL, Continence, Dexterity, Strength, Vision, Balance, Coordination, [place], IADL, Body mechanics, Endurance, Gait, UE functional use, Decreased knowledge of use of DME, [place], Mobility       Visit Diagnosis: Muscle weakness (generalized)  Other lack of coordination    Problem List Patient Active Problem List   Diagnosis Date Noted   Cognitive communication deficit 08/10/2021   Urinary incontinence 08/10/2021   GERD (gastroesophageal reflux disease) 08/03/2021   Chronic post-traumatic stress disorder (PTSD)    Depression with anxiety    Right pontine stroke ([place]) 06/28/2021   Hemiplegia and hemiparesis following cerebral infarction affecting left non-dominant side ([place]) 06/24/2021   Chronic diastolic CHF (congestive heart failure) ([place]) 06/24/2021   Fall 06/24/2021   Abnormal LFTs 06/24/2021   Olecranon bursitis of left elbow 06/12/2021   Chronic hip pain (Bilateral) 06/09/2021   Chronic use of opiate for therapeutic purpose 05/09/2021   Greater trochanteric bursitis of hip (Left) 03/09/2021   Bursitis of hip (Left) Q000111Q    Uncomplicated opioid dependence ([place]) 02/20/2021   Acute conjunctivitis of left eye 01/02/2021   Unintentional weight loss 11/21/2020   Broken teeth (Right) 08/02/2020   History of MI (myocardial infarction) (July 2017) 08/02/2020   Neuropathy 06/09/2020   Dental abscess 06/09/2020   Foot drop (Left) 06/09/2020   Carotid stenosis, asymptomatic, right 04/27/2020   Chronic migraine 11/03/2019   Thrombocytosis 09/16/2019   Erythrocytosis 09/16/2019   Hypercalcemia 09/06/2019   Leukocytosis 09/06/2019   Polycythemia 09/06/2019   Chronic hip pain (Left) 08/27/2019   Osteoarthritis of hip (Left) 08/27/2019   Gluteal tendonitis of buttock (Left) 08/27/2019   Radial nerve palsy (Right) 07/15/2019   Neuropathy of radial nerve (Right) 06/30/2019   Abnormal bruising 06/25/2019   History of carotid endarterectomy (Right) 03/04/2019   Pain medication agreement signed 03/04/2019   Atypical facial pain (Right) 01/27/2019   Chronic ear pain (Right) 01/27/2019   Chronic jaw pain (Right) 01/27/2019   Geniculate Neuralgia (Right) 01/27/2019   Vitamin D deficiency 01/19/2019   Neurogenic pain 01/19/2019   Chronic anticoagulation (PLAVIX) 01/19/2019   Chronic pain syndrome 01/07/2019   Long term current use of opiate analgesic 01/07/2019   Long term prescription benzodiazepine use 01/07/2019   Pharmacologic therapy 01/07/2019   Disorder of skeletal system 01/07/2019   Problems influencing health status 01/07/2019   Chronic headaches (1ry area of Pain) (Right) 01/07/2019   Opiate use 09/24/2018   PVD (peripheral vascular disease) ([place]) 06/24/2018   Chronic ankle pain (Bilateral) 12/27/2017   Tendinopathy of gluteus medius (Right) 12/27/2017   Tendinopathy of gluteus medius (Left) 12/27/2017   Bilateral hip pain 12/26/2017   Chronic elbow pain (Left) 10/17/2017   Elevated troponin I level 05/21/2017   Major depressive  disorder, recurrent episode, moderate ([place]) 11/19/2016   Insomnia 10/30/2016    Constipation 07/25/2016   S/P CABG x 4 07/06/2016   Bradycardia    CAD (coronary artery disease)    Carotid stenosis    CAD in native artery 06/29/2016   Elevated troponin 06/28/2016   Essential hypertension, malignant 06/28/2016   Mild tobacco abuse in early remission 06/28/2016   Essential hypertension    Malignant hypertension    Type 2 diabetes mellitus with other specified complication (HCC)    Chest pain with high risk for cardiac etiology 06/27/2016   NSTEMI (non-ST elevated myocardial infarction) ([place]) 06/27/2016   Proteinuria 03/15/2016   Renal artery stenosis ([place]) 03/15/2016   MDD (major depressive disorder) 10/17/2015   Agoraphobia with panic attacks 04/25/2015   HTN (hypertension), malignant 10/20/2013   Cluster headache 03/20/2012   HLD (hyperlipidemia) 07/26/2010   ADJUSTMENT DISORDER WITH MIXED FEATURES 07/26/2010    Harrel Carina, MS, OTR/L 08/29/2021, 3:53 PM  [place] MAIN [facility] SERVICES [place], [place], [address] Phone: [phone]   Fax:  [phone]  Name: Erika [name] MRN: [id number] Date of Birth: 06-17-1971

## 2021-08-29 NOTE — Therapy (Signed)
[place] MAIN [facility] SERVICES [address] [place], [place], [address] Phone: [phone]   Fax:  [phone]  Physical Therapy Treatment  Patient Details  Name: Erika [name] MRN: [id number] Date of Birth: May 30, 1971 Referring Provider (PT): Leeroy Cha MD   Encounter Date: 08/29/2021   PT End of Session - 08/29/21 1452     Visit Number 7    Number of Visits 20    Date for PT Re-Evaluation 09/14/21    Authorization Type [employer] max visits 20x per year speech, OT, PT    Authorization Time Period 08/03/21-10/26/21    Progress Note Due on Visit 10    PT Start Time 1301    PT Stop Time 1345    PT Time Calculation (min) 44 min    Equipment Utilized During Treatment Gait belt;Other (comment)   L AFO   Activity Tolerance Patient tolerated treatment well    Behavior During Therapy [facility] for tasks assessed/performed;Flat affect;Impulsive             Past Medical History:  Diagnosis Date   Arthralgia of temporomandibular joint    CAD, multiple vessel    a. 06/2016 Cath: ostLM 40%, ostLAD 40%, pLAD 95%, ost-pLCx 60%, pLCx 95%, mLCx 60%, mRCA 95%, D2 50%, LVSF nl;  b. 07/2016 CABG x 4 (LIMA->LAD, VG->Diag, VG->OM, VG->RCA); c. 08/2016 Cath: 3VD w/ 4/4 patent grafts. LAD distal to LIMA has diff dzs->Med rx; d. 08/2020 Cath: 4/4 patent grafts, native 3VD. EF 55-65%-->Med Rx.   Carotid arterial disease ([place])    a. 07/2016 s/p R CEA; b. 02/2021 U/S: RICA 123456, LICA 123456.   Clotting disorder ([place])    Depression    Diastolic dysfunction    a. 06/2016 Echo: EF 50-55%, mild inf wall HK, GR1DD, mild MR, RV sys fxn nl, mildly dilated LA, PASP nl   Fatty liver disease, nonalcoholic Q000111Q   History of blood transfusion    with heart surgery   HLD (hyperlipidemia)    Labile hypertension    a. prior renal ngiogram negative for RAS in 03/2016; b. catecholamines and metanephrines normal, mildly elevated renin with normal aldosterone and normal ratio in  02/2016   Myocardial infarction [facility]) 2017   Obesity    PAD (peripheral artery disease) ([place])    a. 09/2018 s/p L SFA stenting; b. 07/2019 Periph Angio: Patent m/d L SFA stent w/ 100% L SFA distal to stent. L AT 100d, L Peroneal diff dzs-->Med Rx; c. 02/2021 ABIs: stable @ 0.61 on R and 0.46 on L.   PTSD (post-traumatic stress disorder)    Tobacco abuse    Type 2 diabetes mellitus ([place]) 12/2015    Past Surgical History:  Procedure Laterality Date   ABDOMINAL AORTOGRAM W/LOWER EXTREMITY [name] 10/15/2018   Procedure: ABDOMINAL AORTOGRAM W/LOWER EXTREMITY;  Surgeon: Wellington Hampshire, MD;  Location: [place] CV LAB;  Service: Cardiovascular;  Laterality: [name];   ABDOMINAL AORTOGRAM W/LOWER EXTREMITY Bilateral 08/19/2019   Procedure: ABDOMINAL AORTOGRAM W/LOWER EXTREMITY;  Surgeon: Wellington Hampshire, MD;  Location: [place] CV LAB;  Service: Cardiovascular;  Laterality: Bilateral;   CARDIAC CATHETERIZATION [name] 06/29/2016   Procedure: Left Heart Cath and Coronary Angiography;  Surgeon: Minna Merritts, MD;  Location: [place] CV LAB;  Service: Cardiovascular;  Laterality: [name];   CARDIAC CATHETERIZATION [name] 08/29/2016   Procedure: Left Heart Cath and Cors/Grafts Angiography;  Surgeon: Wellington Hampshire, MD;  Location: [place] CV LAB;  Service: Cardiovascular;  Laterality: [name];  CESAREAN SECTION     CHOLECYSTECTOMY     CORONARY ARTERY BYPASS GRAFT [name] 07/06/2016   Procedure: CORONARY ARTERY BYPASS GRAFTING (CABG) x four, using left internal mammary artery and right leg greater saphenous vein harvested endoscopically;  Surgeon: Ivin Poot, MD;  Location: [place];  Service: Open Heart Surgery;  Laterality: [name];   ENDARTERECTOMY Right 07/06/2016   Procedure: ENDARTERECTOMY CAROTID;  Surgeon: Rosetta Posner, MD;  Location: [facility] OR;  Service: Vascular;  Laterality: Right;   ENDARTERECTOMY Right 04/27/2020   Procedure: REDO OF RIGHT ENDARTERECTOMY CAROTID;  Surgeon: Rosetta Posner, MD;  Location: [facility] OR;   Service: Vascular;  Laterality: Right;   LEFT HEART CATH AND CORS/GRAFTS ANGIOGRAPHY [name] 08/24/2020   Procedure: LEFT HEART CATH AND CORS/GRAFTS ANGIOGRAPHY;  Surgeon: Wellington Hampshire, MD;  Location: [place] CV LAB;  Service: Cardiovascular;  Laterality: [name];   PERIPHERAL VASCULAR CATHETERIZATION [name] 04/18/2016   Procedure: Renal Angiography;  Surgeon: Wellington Hampshire, MD;  Location: [place] CV LAB;  Service: Cardiovascular;  Laterality: [name];   PERIPHERAL VASCULAR INTERVENTION Left 10/15/2018   Procedure: PERIPHERAL VASCULAR INTERVENTION;  Surgeon: Wellington Hampshire, MD;  Location: [place] CV LAB;  Service: Cardiovascular;  Laterality: Left;  Left superficial femoral   TEE WITHOUT CARDIOVERSION [name] 07/06/2016   Procedure: TRANSESOPHAGEAL ECHOCARDIOGRAM (TEE);  Surgeon: Ivin Poot, MD;  Location: [place];  Service: Open Heart Surgery;  Laterality: [name];   TONSILLECTOMY      There were no vitals filed for this visit.   Subjective Assessment - 08/29/21 1450     Subjective Patient presents with husband. Husband reports patient has been more stiff lately and having increased tremors with brain fog. No falls.    Pertinent History Pt presented to [facility] 06/24/21 c c/c of slurred speech, L facial droop & LUE weakness. MRI revealed acute R pontine perforator infarct with chronic R parietal & occipital infarcts. PMH: HTN, HLD, DM, tobacco abuse, PAD, CAD, CABG, dCHF, peptic foot drop, renal artery stenosis, MI and CAD (s/p of R CEA 2017), PTSD, adjustment disorder, obesity, Left hip OA s/p OPPT 2020. Pt DC 06/28/21 to inpatient rehab in [place], [place] to home 08/01/21. AT CIR pt worked extensively on STS with MinA or Supervision and HW, Stand and squat pivot transfers with MinA, AMB progressed to ~7f max, mostly with HW (some RW use with fixed LUE on handle), but was mostly limited in further progression of distance 2/2 chronic PAD ischaemic pain or Left knee buckling despite AFO use. CIR recommending  update to more supporting AFO at transfer to OPPT. Since home, pt is using WC to navigate the environment, family assists with ADL performance, WC still needs to be modified to fit through BR doorframe. Pt reports not feeling safe with HW at present. Husband has been helping with gnelte stretching in mornings as pt sleeps in a flexion synergy posture on LLE.    Limitations Walking;Standing    How long can you sit comfortably? Generally well tolerated with occasional buttocks soreness    How long can you stand comfortably? At eval: tolerates <2 minutes prior to ischemic leg pain    How long can you walk comfortably? Longest distance at CIR last month ~290f    Currently in Pain? Yes    Pain Score 5     Pain Location Leg    Pain Descriptors / Indicators Aching    Pain Onset More than a month ago  Neuro Re-ed: Weave between 6 cones; patient very challenged knocking over 75% of the cones, max cueing for sequencing and placement of hemiwalker Walk 10 ft turn corner then turn another corner 3 ft away and return to chair ; corners marked with cones, cues for sequencing and hemiwalker placement, patient requires cues for widening BOS     Therapeutic Exercises:  Lateral stepping 4x length of plinth table with close CGA and cues for sequencing   Seated:   Hamstring isometric into dynadisc 10x 3 second holds  lateral stepping over/onto hedgehog 10x LLE occasional Min A for sequencing Alternating heel taps to RTB on ground occasional min A to LLE for returning to starting position 10x each LE   Supine:  Hamstring lengthening stretch 60 seconds with leg on PT shoulder  Single knee to chest 30 seconds LLE Hip figure four lengthening stretch 30 seconds Popliteal angle hamstring stretch 30 seconds LLE      Stand pivot transfer from w/c to/from plinth; cues for sequencing, placement of hemiwalker, foot placement x 2   Education provided throughout session via VC/TC and  demonstration to facilitate movement at target joints and correct muscle activation for all testing and exercises performed.      Patient presents with increased tone and increased brain tremors. Patient and husband educated on watching for potential infection such as bladder and UTI. Patient very challenged with ambulation and placement of AD requiring maximal cueing and stabilization this session. Education on pelvic floor therapy and potential need for therapy to address continence. Pt will benefit from skilled PT evaluation to maximize safety, independence, and tolerance to basic moblity required for ADL performance and accessing the community and to reduce risk of falls in the home.                 PT Education - 08/29/21 1451     Education Details exercise technique, body mechanics    Person(s) Educated Patient    Methods Explanation;Demonstration;Tactile cues;Verbal cues    Comprehension Verbalized understanding;Returned demonstration;Verbal cues required;Tactile cues required              PT Short Term Goals - 08/03/21 1645       PT SHORT TERM GOAL #1   Title Pt to demonstrate improved tolerance to standing >3 minutes without physical assist or LOB. DME ad lib    Baseline <2 minutes at eval    Time 4    Period Weeks    Status New    Target Date 08/31/21      PT SHORT TERM GOAL #2   Title Pt to demonstrate perfromance of 5xSTS c AFO/Hemi walker at supervision level independence without LOB or physical assist.    Baseline Needs minGuard to MinA to perform at eval    Time 4    Period Weeks    Status New    Target Date 08/31/21      PT SHORT TERM GOAL #3   Title Pt to demonstrate AMB ~21f at supervision level with AFO, HW without LOB.    Baseline [facility] eval [facility] and [facility].    Time 6    Period Weeks    Status New    Target Date 09/14/21      PT SHORT TERM GOAL #4   Title Pt to demonstrate TUG test <120sec c HW and AFO at minguard - MinA  level to improve independence with transfers and reduce falls risk at home.    Baseline No  tug performed at eval:    Time 7    Period Weeks    Status New    Target Date 09/21/21               PT Long Term Goals - 08/03/21 1647       PT LONG TERM GOAL #1   Title Pt to demonstrate perfromance of 5xSTS c AFO/Hemi walker at supervision level independence without LOB or physical assist in less than 20sec.    Baseline Unable at eval, requires minguard to minA    Time 8    Period Weeks    Status New    Target Date 09/28/21      PT LONG TERM GOAL #2   Title Pt to demonstrate FOTO score improvement >10 points to demonstrate improved perception of ability in ADL    Baseline 23 at eval:    Time 8    Period Weeks    Status New    Target Date 09/28/21      PT LONG TERM GOAL #3   Title Pt able to demonstrate stand pivot transfer with HW and AFO without LOB at supervision level to modI.    Baseline Needs minGuard to MinA for safety    Time 8    Period Weeks    Status New    Target Date 09/28/21      PT LONG TERM GOAL #4   Title Pt to demonstrate TUG test <60sec c HW and AFO at supervision level to improve independence with transfers and reduce falls risk at home.    Baseline No Tug at [facility]    Time 12    Period Weeks    Status New    Target Date 10/26/21                   Plan - 08/29/21 1453     Clinical Impression Statement Patient presents with increased tone and increased brain tremors. Patient and husband educated on watching for potential infection such as bladder and UTI. Patient very challenged with ambulation and placement of AD requiring maximal cueing and stabilization this session. Education on pelvic floor therapy and potential need for therapy to address continence. Pt will benefit from skilled PT evaluation to maximize safety, independence, and tolerance to basic moblity required for ADL performance and accessing the community and to reduce risk of falls  in the home.    Personal Factors and Comorbidities Age;Comorbidity 3+;Fitness;Past/Current Experience;Time since onset of injury/illness/exacerbation    Comorbidities DM, HTN, PVD, clotting disorder, CAD    Examination-Activity Limitations Squat;Stairs;Bathing;Bed Mobility;Dressing;Reach Overhead;Hygiene/Grooming;Bend;Carry;Locomotion Level;Transfers;Toileting;Stand    Examination-Participation Restrictions Cleaning;Community Activity;Occupation;Laundry;Driving;Yard Work    [employer] Evolving/Moderate complexity    Rehab Potential Good    PT Frequency 2x / week    PT Duration 12 weeks    PT Treatment/Interventions Electrical Stimulation;Gait training;Stair training;Functional mobility training;Therapeutic activities;Therapeutic exercise;Balance training;Neuromuscular re-education;Patient/family education;Manual techniques;Wheelchair mobility training;Energy conservation;Passive range of motion;Orthotic Fit/Training;DME Instruction;ADLs/Self Care Home Management    PT Next Visit Plan work toward supervision level STS and SPT c HW, sustained standing tolerance.    PT Home Exercise Plan Not updated this visit- Encouraged seated LE strengthening and practicing gait with husband.    Consulted and Agree with Plan of Care Patient;Family member/caregiver    Family Member Consulted husband             Patient will benefit from skilled therapeutic intervention in order to improve the following deficits and impairments:  Decreased  endurance, Decreased mobility, Difficulty walking, Hypomobility, Decreased balance, Abnormal gait, Increased muscle spasms, Cardiopulmonary status limiting activity, Impaired flexibility, Hypermobility, Decreased strength, Decreased activity tolerance  Visit Diagnosis: Muscle weakness (generalized)  Abnormality of gait and mobility  Unsteadiness on feet     Problem List Patient Active Problem List   Diagnosis Date Noted   Cognitive  communication deficit 08/10/2021   Urinary incontinence 08/10/2021   GERD (gastroesophageal reflux disease) 08/03/2021   Chronic post-traumatic stress disorder (PTSD)    Depression with anxiety    Right pontine stroke ([place]) 06/28/2021   Hemiplegia and hemiparesis following cerebral infarction affecting left non-dominant side ([place]) 06/24/2021   Chronic diastolic CHF (congestive heart failure) ([place]) 06/24/2021   Fall 06/24/2021   Abnormal LFTs 06/24/2021   Olecranon bursitis of left elbow 06/12/2021   Chronic hip pain (Bilateral) 06/09/2021   Chronic use of opiate for therapeutic purpose 05/09/2021   Greater trochanteric bursitis of hip (Left) 03/09/2021   Bursitis of hip (Left) Q000111Q   Uncomplicated opioid dependence ([place]) 02/20/2021   Acute conjunctivitis of left eye 01/02/2021   Unintentional weight loss 11/21/2020   Broken teeth (Right) 08/02/2020   History of MI (myocardial infarction) (July 2017) 08/02/2020   Neuropathy 06/09/2020   Dental abscess 06/09/2020   Foot drop (Left) 06/09/2020   Carotid stenosis, asymptomatic, right 04/27/2020   Chronic migraine 11/03/2019   Thrombocytosis 09/16/2019   Erythrocytosis 09/16/2019   Hypercalcemia 09/06/2019   Leukocytosis 09/06/2019   Polycythemia 09/06/2019   Chronic hip pain (Left) 08/27/2019   Osteoarthritis of hip (Left) 08/27/2019   Gluteal tendonitis of buttock (Left) 08/27/2019   Radial nerve palsy (Right) 07/15/2019   Neuropathy of radial nerve (Right) 06/30/2019   Abnormal bruising 06/25/2019   History of carotid endarterectomy (Right) 03/04/2019   Pain medication agreement signed 03/04/2019   Atypical facial pain (Right) 01/27/2019   Chronic ear pain (Right) 01/27/2019   Chronic jaw pain (Right) 01/27/2019   Geniculate Neuralgia (Right) 01/27/2019   Vitamin D deficiency 01/19/2019   Neurogenic pain 01/19/2019   Chronic anticoagulation (PLAVIX) 01/19/2019   Chronic pain syndrome 01/07/2019   Long term current  use of opiate analgesic 01/07/2019   Long term prescription benzodiazepine use 01/07/2019   Pharmacologic therapy 01/07/2019   Disorder of skeletal system 01/07/2019   Problems influencing health status 01/07/2019   Chronic headaches (1ry area of Pain) (Right) 01/07/2019   Opiate use 09/24/2018   PVD (peripheral vascular disease) ([place]) 06/24/2018   Chronic ankle pain (Bilateral) 12/27/2017   Tendinopathy of gluteus medius (Right) 12/27/2017   Tendinopathy of gluteus medius (Left) 12/27/2017   Bilateral hip pain 12/26/2017   Chronic elbow pain (Left) 10/17/2017   Elevated troponin I level 05/21/2017   Major depressive disorder, recurrent episode, moderate (HCC) 11/19/2016   Insomnia 10/30/2016   Constipation 07/25/2016   S/P CABG x 4 07/06/2016   Bradycardia    CAD (coronary artery disease)    Carotid stenosis    CAD in native artery 06/29/2016   Elevated troponin 06/28/2016   Essential hypertension, malignant 06/28/2016   Mild tobacco abuse in early remission 06/28/2016   Essential hypertension    Malignant hypertension    Type 2 diabetes mellitus with other specified complication (HCC)    Chest pain with high risk for cardiac etiology 06/27/2016   NSTEMI (non-ST elevated myocardial infarction) ([place]) 06/27/2016   Proteinuria 03/15/2016   Renal artery stenosis ([place]) 03/15/2016   MDD (major depressive disorder) 10/17/2015   Agoraphobia with panic attacks 04/25/2015  HTN (hypertension), malignant 10/20/2013   Cluster headache 03/20/2012   HLD (hyperlipidemia) 07/26/2010   ADJUSTMENT DISORDER WITH MIXED FEATURES 07/26/2010    Janna Arch, PT, DPT  08/29/2021, 2:55 PM  [place] MAIN [facility] SERVICES [address] [place], [place], [address] Phone: [phone]   Fax:  [phone]  Name: Erika [name] MRN: [id number] Date of Birth: 08/17/1971

## 2021-08-30 NOTE — Therapy (Signed)
[place] MAIN [facility] SERVICES [address] [name], [place], [address] Phone: [phone]   Fax:  [phone]  Speech Language Pathology Treatment  Patient Details  Name: Erika [name] MRN: [id number] Date of Birth: 1971/12/20 Referring Provider (SLP): Ranell Patrick Clide Deutscher, MD   Encounter Date: 08/29/2021   End of Session - 08/30/21 0839     Visit Number 6    Number of Visits 20    Date for SLP Re-Evaluation 11/02/21    Authorization Type [employer] - 20 visits per calendar year for OT, PT, and ST combined. counts as 1 visit if same day    Authorization - Visit Number 6    Authorization - Number of Visits 20    Progress Note Due on Visit 10    SLP Start Time 1500    SLP Stop Time  1600    SLP Time Calculation (min) 60 min             Past Medical History:  Diagnosis Date   Arthralgia of temporomandibular joint    CAD, multiple vessel    a. 06/2016 Cath: ostLM 40%, ostLAD 40%, pLAD 95%, ost-pLCx 60%, pLCx 95%, mLCx 60%, mRCA 95%, D2 50%, LVSF nl;  b. 07/2016 CABG x 4 (LIMA->LAD, VG->Diag, VG->OM, VG->RCA); c. 08/2016 Cath: 3VD w/ 4/4 patent grafts. LAD distal to LIMA has diff dzs->Med rx; d. 08/2020 Cath: 4/4 patent grafts, native 3VD. EF 55-65%-->Med Rx.   Carotid arterial disease ([place])    a. 07/2016 s/p R CEA; b. 02/2021 U/S: RICA 123456, LICA 123456.   Clotting disorder ([place])    Depression    Diastolic dysfunction    a. 06/2016 Echo: EF 50-55%, mild inf wall HK, GR1DD, mild MR, RV sys fxn nl, mildly dilated LA, PASP nl   Fatty liver disease, nonalcoholic [id number]   History of blood transfusion    with heart surgery   HLD (hyperlipidemia)    Labile hypertension    a. prior renal ngiogram negative for RAS in 03/2016; b. catecholamines and metanephrines normal, mildly elevated renin with normal aldosterone and normal ratio in 02/2016   Myocardial infarction [facility]) 2017   Obesity    PAD (peripheral artery disease) ([place])    a. 09/2018 s/p L SFA  stenting; b. 07/2019 Periph Angio: Patent m/d L SFA stent w/ 100% L SFA distal to stent. L AT 100d, L Peroneal diff dzs-->Med Rx; c. 02/2021 ABIs: stable @ 0.61 on R and 0.46 on L.   PTSD (post-traumatic stress disorder)    Tobacco abuse    Type 2 diabetes mellitus ([place]) 12/2015    Past Surgical History:  Procedure Laterality Date   ABDOMINAL AORTOGRAM W/LOWER EXTREMITY [name] 10/15/2018   Procedure: ABDOMINAL AORTOGRAM W/LOWER EXTREMITY;  Surgeon: Wellington Hampshire, MD;  Location: [place] CV LAB;  Service: Cardiovascular;  Laterality: [name];   ABDOMINAL AORTOGRAM W/LOWER EXTREMITY Bilateral 08/19/2019   Procedure: ABDOMINAL AORTOGRAM W/LOWER EXTREMITY;  Surgeon: Wellington Hampshire, MD;  Location: [place] CV LAB;  Service: Cardiovascular;  Laterality: Bilateral;   CARDIAC CATHETERIZATION [name] 06/29/2016   Procedure: Left Heart Cath and Coronary Angiography;  Surgeon: Minna Merritts, MD;  Location: [place] CV LAB;  Service: Cardiovascular;  Laterality: [name];   CARDIAC CATHETERIZATION [name] 08/29/2016   Procedure: Left Heart Cath and Cors/Grafts Angiography;  Surgeon: Wellington Hampshire, MD;  Location: [place] CV LAB;  Service: Cardiovascular;  Laterality: [name];   CESAREAN SECTION     CHOLECYSTECTOMY  CORONARY ARTERY BYPASS GRAFT [name] 07/06/2016   Procedure: CORONARY ARTERY BYPASS GRAFTING (CABG) x four, using left internal mammary artery and right leg greater saphenous vein harvested endoscopically;  Surgeon: Ivin Poot, MD;  Location: [place];  Service: Open Heart Surgery;  Laterality: [name];   ENDARTERECTOMY Right 07/06/2016   Procedure: ENDARTERECTOMY CAROTID;  Surgeon: Rosetta Posner, MD;  Location: [facility] OR;  Service: Vascular;  Laterality: Right;   ENDARTERECTOMY Right 04/27/2020   Procedure: REDO OF RIGHT ENDARTERECTOMY CAROTID;  Surgeon: Rosetta Posner, MD;  Location: [facility] OR;  Service: Vascular;  Laterality: Right;   LEFT HEART CATH AND CORS/GRAFTS ANGIOGRAPHY [name] 08/24/2020   Procedure: LEFT  HEART CATH AND CORS/GRAFTS ANGIOGRAPHY;  Surgeon: Wellington Hampshire, MD;  Location: [place] CV LAB;  Service: Cardiovascular;  Laterality: [name];   PERIPHERAL VASCULAR CATHETERIZATION [name] 04/18/2016   Procedure: Renal Angiography;  Surgeon: Wellington Hampshire, MD;  Location: [place] CV LAB;  Service: Cardiovascular;  Laterality: [name];   PERIPHERAL VASCULAR INTERVENTION Left 10/15/2018   Procedure: PERIPHERAL VASCULAR INTERVENTION;  Surgeon: Wellington Hampshire, MD;  Location: [place] CV LAB;  Service: Cardiovascular;  Laterality: Left;  Left superficial femoral   TEE WITHOUT CARDIOVERSION [name] 07/06/2016   Procedure: TRANSESOPHAGEAL ECHOCARDIOGRAM (TEE);  Surgeon: Ivin Poot, MD;  Location: [place];  Service: Open Heart Surgery;  Laterality: [name];   TONSILLECTOMY      There were no vitals filed for this visit.   Subjective Assessment - 08/30/21 0821     Subjective "You're not going to like this; I forgot my homework."    Patient is accompained by: Family member   [name]   Currently in Pain? Yes    Pain Score 8     Pain Location Head                   ADULT SLP TREATMENT - 08/30/21 [id number]       General Information   Behavior/Cognition Alert;Cooperative    HPI Erika [name] is a 50 y.o. female admitted to [facility] 06/24/21 with slurred speech, left facial droop and LUE weakness; MRI showed acute R pontine CVA and chronic R parietal and occipital infarcts. Past medical history is noted for HTN, HLD, DM II, PAD, MI, CAD, s/p CABG, CHF, PTSD, adjustment disorder, obesity. Patient worked with [place], [place], [place] in [place] 06/28/21-08/01/21. SLP targeted cognitive deficits and dysarthria, as well as dysphagia; upgraded to regular/thin by time of d/c home.      Treatment Provided   Treatment provided Cognitive-Linquistic      Cognitive-Linquistic Treatment   Treatment focused on Cognition;Patient/family/caregiver education    Skilled Treatment Spouse reported patient was asking repetitive questions,  with confusion/confabulation over the weekend. Has insisted that she hasn't had her meds when she has. Reviewed using a schedule at home; pt required occasional mod cues to provide on-topic responses to questions about her morning routine, and identify when steps omitted. Generated two schedules (one for therapy days, one for non-therapy days) with checklist for pt to use. Educated on keeping this with patient and prompting her to reference the schedule when she has questions about her day/check off activities after completed. Simple attention-based tasks (N-Back, picture recall) 80% accuracy; mod cues to use strategy (repeat) to recall/maintain focus.      Assessment / Recommendations / Plan   Plan Continue with current plan of care      Progression Toward Goals   Progression toward goals Progressing toward goals  SLP Education - 08/30/21 [phone]     Education Details use of written schedule    Person(s) Educated Patient;Spouse    Methods Explanation;Handout    Comprehension Verbalized understanding              SLP Short Term Goals - 08/04/21 1246       SLP SHORT TERM GOAL #1   Title Pt will verbalize awareness of impulsivity in simple tasks >80% of the time with min cues.    Time 10    Period --   sessions   Status New      SLP SHORT TERM GOAL #2   Title Pt will ID out-of-turn responses >80% with non-verbal cues.    Time 10    Period --   sessions   Status New      SLP SHORT TERM GOAL #3   Title Pt will verbalize steps to safety for transfers/gait training to reduce impulsive/unsafe movement.    Time 10    Period --   sessions   Status New      SLP SHORT TERM GOAL #4   Title Pt will verbalize daily schedule, recent events and salient information using external memory aids.    Time 10    Period --   sessions   Status New      SLP SHORT TERM GOAL #5   Title Generate written or verbal cues to improve accuracy of 3 house hold tasks or safety.    Time 10     Period --   sessions   Status New              SLP Long Term Goals - 08/04/21 1334       SLP LONG TERM GOAL #1   Title Pt will verbalize or write steps prior to task initiation >80% with modified independence.    Time 12    Period Weeks    Status New    Target Date 11/01/21      SLP LONG TERM GOAL #2   Title Pt will demonstrate appropriate turn-taking >90% of the time in 15 minutes mod complex conversation.    Time 12    Period Weeks    Status New    Target Date 11/01/21      SLP LONG TERM GOAL #3   Title Pt will use external aids to organize/recall appointments and medical information for herself and her daughter.    Time 12    Period Weeks    Status New    Target Date 11/01/21      SLP LONG TERM GOAL #4   Title Pt will plan, organize and present a 20-30 minute lesson on personal or gun safety using external aids/compensations    Time 12    Period Weeks    Status New    Target Date 11/01/21      SLP LONG TERM GOAL #5   Title Patient will use compensations for dysarthria in 20 minute mod complex conversation outside of [place] room for >95% intelligibility.    Time 12    Period Weeks    Status New    Target Date 11/01/21              Plan - 08/30/21 0839     Clinical Impression Statement Xinyue Sang presents with overall mild-moderate cognitive communication deficits and mild dysarthria. Remains impulsive but responsive to feedback and made attemtps to slow rate to improve her accuracy. Did not bring binder  today; provided laminated schedule for home use. Recommend skilled ST to maximize cognitive and communication abilities to increase safety, independence, and life participation.    Speech Therapy Frequency 2x / week    Duration 12 weeks   20 visits allowed per calendar year per ins   Treatment/Interventions Language facilitation;Environmental controls;Cueing hierarchy;SLP instruction and feedback;Compensatory techniques;Cognitive  reorganization;Functional tasks;Compensatory strategies;Internal/external aids;Multimodal communcation approach;Patient/family education    Potential to Achieve Goals Good    SLP Home Exercise Plan notebook/binder for therapies    Consulted and Agree with Plan of Care Patient;Family member/caregiver             Patient will benefit from skilled therapeutic intervention in order to improve the following deficits and impairments:   Cognitive communication deficit  Dysarthria and anarthria    Problem List Patient Active Problem List   Diagnosis Date Noted   Cognitive communication deficit 08/10/2021   Urinary incontinence 08/10/2021   GERD (gastroesophageal reflux disease) 08/03/2021   Chronic post-traumatic stress disorder (PTSD)    Depression with anxiety    Right pontine stroke ([place]) 06/28/2021   Hemiplegia and hemiparesis following cerebral infarction affecting left non-dominant side ([place]) 06/24/2021   Chronic diastolic CHF (congestive heart failure) ([place]) 06/24/2021   Fall 06/24/2021   Abnormal LFTs 06/24/2021   Olecranon bursitis of left elbow 06/12/2021   Chronic hip pain (Bilateral) 06/09/2021   Chronic use of opiate for therapeutic purpose 05/09/2021   Greater trochanteric bursitis of hip (Left) 03/09/2021   Bursitis of hip (Left) Q000111Q   Uncomplicated opioid dependence ([place]) 02/20/2021   Acute conjunctivitis of left eye 01/02/2021   Unintentional weight loss 11/21/2020   Broken teeth (Right) 08/02/2020   History of MI (myocardial infarction) (July 2017) 08/02/2020   Neuropathy 06/09/2020   Dental abscess 06/09/2020   Foot drop (Left) 06/09/2020   Carotid stenosis, asymptomatic, right 04/27/2020   Chronic migraine 11/03/2019   Thrombocytosis 09/16/2019   Erythrocytosis 09/16/2019   Hypercalcemia 09/06/2019   Leukocytosis 09/06/2019   Polycythemia 09/06/2019   Chronic hip pain (Left) 08/27/2019   Osteoarthritis of hip (Left) 08/27/2019   Gluteal  tendonitis of buttock (Left) 08/27/2019   Radial nerve palsy (Right) 07/15/2019   Neuropathy of radial nerve (Right) 06/30/2019   Abnormal bruising 06/25/2019   History of carotid endarterectomy (Right) 03/04/2019   Pain medication agreement signed 03/04/2019   Atypical facial pain (Right) 01/27/2019   Chronic ear pain (Right) 01/27/2019   Chronic jaw pain (Right) 01/27/2019   Geniculate Neuralgia (Right) 01/27/2019   Vitamin D deficiency 01/19/2019   Neurogenic pain 01/19/2019   Chronic anticoagulation (PLAVIX) 01/19/2019   Chronic pain syndrome 01/07/2019   Long term current use of opiate analgesic 01/07/2019   Long term prescription benzodiazepine use 01/07/2019   Pharmacologic therapy 01/07/2019   Disorder of skeletal system 01/07/2019   Problems influencing health status 01/07/2019   Chronic headaches (1ry area of Pain) (Right) 01/07/2019   Opiate use 09/24/2018   PVD (peripheral vascular disease) ([place]) 06/24/2018   Chronic ankle pain (Bilateral) 12/27/2017   Tendinopathy of gluteus medius (Right) 12/27/2017   Tendinopathy of gluteus medius (Left) 12/27/2017   Bilateral hip pain 12/26/2017   Chronic elbow pain (Left) 10/17/2017   Elevated troponin I level 05/21/2017   Major depressive disorder, recurrent episode, moderate (HCC) 11/19/2016   Insomnia 10/30/2016   Constipation 07/25/2016   S/P CABG x 4 07/06/2016   Bradycardia    CAD (coronary artery disease)    Carotid stenosis    CAD  in native artery 06/29/2016   Elevated troponin 06/28/2016   Essential hypertension, malignant 06/28/2016   Mild tobacco abuse in early remission 06/28/2016   Essential hypertension    Malignant hypertension    Type 2 diabetes mellitus with other specified complication (HCC)    Chest pain with high risk for cardiac etiology 06/27/2016   NSTEMI (non-ST elevated myocardial infarction) ([place]) 06/27/2016   Proteinuria 03/15/2016   Renal artery stenosis ([place]) 03/15/2016   MDD (major  depressive disorder) 10/17/2015   Agoraphobia with panic attacks 04/25/2015   HTN (hypertension), malignant 10/20/2013   Cluster headache 03/20/2012   HLD (hyperlipidemia) 07/26/2010   ADJUSTMENT DISORDER WITH MIXED FEATURES 07/26/2010   Deneise Lever, [place], [place] Speech-Language Pathologist   Aliene Altes 08/30/2021, 8:40 AM  [place] [address] [place], [place], [address] Phone: [phone]   Fax:  [phone]   Name: MAKAELYN EIB MRN: [id number] Date of Birth: [birth year]

## 2021-08-31 ENCOUNTER — Telehealth: Payer: Self-pay | Admitting: Endocrinology

## 2021-08-31 ENCOUNTER — Other Ambulatory Visit (HOSPITAL_COMMUNITY): Payer: Self-pay

## 2021-08-31 ENCOUNTER — Other Ambulatory Visit: Payer: Self-pay | Admitting: Endocrinology

## 2021-08-31 MED ORDER — LANTUS SOLOSTAR 100 UNIT/ML ~~LOC~~ SOPN: 54.0000 [IU] | PEN_INJECTOR | SUBCUTANEOUS | 99 refills | Status: DC

## 2021-08-31 NOTE — Telephone Encounter (Signed)
Hello Dr. Loanne Drilling,   I just got off the phone with [facility] speaking with Julianna.   She states she has tried all forms of Glargine for this patient through her insurance plan and they all state will need a prior authorization (generic, Lantus, Basaglar and Semglee).     She went on to say that a PA most likely will be denied because the insurance advises a unit to unit conversion from Lantus to Levemir is appropriate.   Please advise and I will do whatever you think appropriate for this patient.   Venida Jarvis. Nadara Mustard, CPhT  Patient Advocate  [facility]  Phone: [phone]  Fax:  [phone]

## 2021-08-31 NOTE — Telephone Encounter (Signed)
Called and spoke with patient , pt said that she has spoke with pharmacy  Lantus is covered.

## 2021-08-31 NOTE — Telephone Encounter (Signed)
Called and spoke with pharmacy, listed alternative glargine insuline (Semglee, Basaglar or Tougeo) Pharmacy advised no glargine insulin is covered but Levemir is covered. Please advise.

## 2021-08-31 NOTE — Telephone Encounter (Signed)
It is really important that we know which insulin is covered.  Please let me know.

## 2021-08-31 NOTE — Telephone Encounter (Signed)
[facility] calling to get an update on changing Lantus to Levemir.  This has been an ongoing request since 08/16/21 - this has been an ongoing request by phone & fax since 08/16/21 - is there anyone available to assist the pharmacy now so that this patient can get medication??

## 2021-09-02 ENCOUNTER — Other Ambulatory Visit: Payer: Self-pay

## 2021-09-02 ENCOUNTER — Encounter: Payer: Self-pay | Admitting: Family Medicine

## 2021-09-04 ENCOUNTER — Telehealth: Payer: Self-pay | Admitting: Family Medicine

## 2021-09-04 ENCOUNTER — Other Ambulatory Visit: Payer: Self-pay | Admitting: Physical Medicine and Rehabilitation

## 2021-09-04 ENCOUNTER — Other Ambulatory Visit (HOSPITAL_COMMUNITY): Payer: Self-pay

## 2021-09-04 ENCOUNTER — Other Ambulatory Visit: Payer: Self-pay | Admitting: Endocrinology

## 2021-09-04 ENCOUNTER — Encounter: Payer: Self-pay | Admitting: Family Medicine

## 2021-09-04 ENCOUNTER — Ambulatory Visit: Payer: Managed Care, Other (non HMO) | Admitting: Speech Pathology

## 2021-09-04 ENCOUNTER — Ambulatory Visit: Payer: Managed Care, Other (non HMO) | Admitting: Physical Therapy

## 2021-09-04 ENCOUNTER — Ambulatory Visit: Payer: Managed Care, Other (non HMO) | Admitting: Occupational Therapy

## 2021-09-04 DIAGNOSIS — R059 Cough, unspecified: Secondary | ICD-10-CM

## 2021-09-04 DIAGNOSIS — R410 Disorientation, unspecified: Secondary | ICD-10-CM

## 2021-09-04 DIAGNOSIS — Z1321 Encounter for screening for nutritional disorder: Secondary | ICD-10-CM

## 2021-09-04 DIAGNOSIS — M62838 Other muscle spasm: Secondary | ICD-10-CM

## 2021-09-04 MED ORDER — INSULIN GLARGINE-YFGN 100 UNIT/ML ~~LOC~~ SOPN: 54.0000 [IU] | PEN_INJECTOR | Freq: Every morning | SUBCUTANEOUS | 3 refills | Status: DC

## 2021-09-04 MED ORDER — EZETIMIBE 10 MG PO TABS: 10.0000 mg | ORAL_TABLET | Freq: Every day | ORAL | 2 refills | Status: DC

## 2021-09-04 MED ORDER — CLOPIDOGREL BISULFATE 75 MG PO TABS: 75.0000 mg | ORAL_TABLET | Freq: Every day | ORAL | 2 refills | Status: DC

## 2021-09-04 NOTE — Telephone Encounter (Signed)
Chart review - pt sent mychart message to PMR provider who is working to coordinate urgent outpatient work-up. Agree with work-up may also need ER if unable to coordinate outpatient. Will send mychart to patient's spouse as well.

## 2021-09-04 NOTE — Telephone Encounter (Signed)
Caryl Pina from [place] outpatient case management department called with update on patient. States she has been in contact with pt's husband and he has said that Erika [name]'s showing signs of delirium and short term memory loss. Case manager is wanting assistant to reach out to patient's husband to discuss. I also advised that patient's husband call to make an appt. Caryl Pina verbalized understanding.

## 2021-09-06 ENCOUNTER — Encounter: Payer: Managed Care, Other (non HMO) | Attending: Physical Medicine and Rehabilitation | Admitting: Registered Nurse

## 2021-09-06 ENCOUNTER — Other Ambulatory Visit: Payer: Self-pay

## 2021-09-06 ENCOUNTER — Other Ambulatory Visit: Payer: Self-pay | Admitting: Family Medicine

## 2021-09-06 ENCOUNTER — Telehealth (HOSPITAL_BASED_OUTPATIENT_CLINIC_OR_DEPARTMENT_OTHER): Payer: Managed Care, Other (non HMO) | Admitting: Internal Medicine

## 2021-09-06 ENCOUNTER — Encounter: Payer: Self-pay | Admitting: Registered Nurse

## 2021-09-06 VITALS — BP 128/86 | HR 91 | Temp 98.9°F | Ht 66.0 in | Wt 203.4 lb

## 2021-09-06 DIAGNOSIS — G629 Polyneuropathy, unspecified: Secondary | ICD-10-CM | POA: Diagnosis present

## 2021-09-06 DIAGNOSIS — M79605 Pain in left leg: Secondary | ICD-10-CM | POA: Diagnosis present

## 2021-09-06 DIAGNOSIS — G43909 Migraine, unspecified, not intractable, without status migrainosus: Secondary | ICD-10-CM | POA: Insufficient documentation

## 2021-09-06 DIAGNOSIS — M6281 Muscle weakness (generalized): Secondary | ICD-10-CM | POA: Diagnosis present

## 2021-09-06 DIAGNOSIS — M6289 Other specified disorders of muscle: Secondary | ICD-10-CM | POA: Insufficient documentation

## 2021-09-06 DIAGNOSIS — G894 Chronic pain syndrome: Secondary | ICD-10-CM | POA: Insufficient documentation

## 2021-09-06 DIAGNOSIS — R410 Disorientation, unspecified: Secondary | ICD-10-CM | POA: Diagnosis present

## 2021-09-06 DIAGNOSIS — Z79891 Long term (current) use of opiate analgesic: Secondary | ICD-10-CM | POA: Diagnosis present

## 2021-09-06 DIAGNOSIS — M79604 Pain in right leg: Secondary | ICD-10-CM | POA: Insufficient documentation

## 2021-09-06 DIAGNOSIS — Z79899 Other long term (current) drug therapy: Secondary | ICD-10-CM

## 2021-09-06 DIAGNOSIS — I639 Cerebral infarction, unspecified: Secondary | ICD-10-CM

## 2021-09-06 MED ORDER — OXYCODONE HCL 5 MG PO TABS: 5.0000 mg | ORAL_TABLET | Freq: Every day | ORAL | 0 refills | Status: DC | PRN

## 2021-09-06 MED ORDER — PREGABALIN 225 MG PO CAPS: 225.0000 mg | ORAL_CAPSULE | Freq: Two times a day (BID) | ORAL | 2 refills | Status: DC

## 2021-09-06 NOTE — Progress Notes (Signed)
Subjective:    Patient ID: Erika [name], female    DOB: [birth year], 50 y.o.   MRN: [id number]  HPI: Erika [name] is a 50 y.o. female who returns for follow up appointment for chronic pain and medication refill.  She states her pain is located in her bilateral lower extremities  and also reports she has a headache. She rates her pain 8. Her current exercise regime is walking with hemi walker short distances, and she has scheduled appointments with Neuro-Rehabilitation   Husband in room, all questions answered.     Pain Inventory Average Pain 8 Pain Right Now 8 My pain is constant and aching  In the last 24 hours, has pain interfered with the following? General activity 7 Relation with others 5 Enjoyment of life 5 What TIME of day is your pain at its worst? night Sleep (in general) Poor  Pain is worse with: walking, standing, and some activites Pain improves with: o2 and medication Relief from Meds: 2  Family History  Adopted: Yes  Problem Relation Age of Onset   Diabetes Mother    Diabetes Father    Alcohol abuse Father    Heart disease Father    Drug abuse Father    Stroke Sister    Anxiety disorder Sister    Social History   Socioeconomic History   Marital status: Married    Spouse name: Not on file   Number of children: 1   Years of education: 14   Highest education level: Not on file  Occupational History   Occupation: Disability  Tobacco Use   Smoking status: Every Day    Packs/day: 0.50    Years: 27.00    Pack years: 13.50    Types: Cigarettes   Smokeless tobacco: Never  Vaping Use   Vaping Use: Former  Substance and Sexual Activity   Alcohol use: Not Currently    Alcohol/week: 0.0 standard drinks    Comment: socially   Drug use: No   Sexual activity: Yes    Partners: Male    Birth control/protection: None  Other Topics Concern   Not on file  Social History Narrative   11/21/20   From: [name]   Living: with  husband, [name] (2009) and special needs daughter    Work: disability       Family: daughter - Erika [name] (special needs, lives with her) and 2 grown step children - [name] and [name]       Enjoys: play with dogs - 2 german Shepard, mut, and blue tick walker mix      Exercise: PAD limits exercise   Diet: diabetic diet and low potassium due to daughter      Safety   Seat belts: Yes    Guns: Yes  and secure   Safe in relationships: Yes    Social Determinants of Health   Financial Resource Strain: Not on file  Food Insecurity: Not on file  Transportation Needs: Not on file  Physical Activity: Not on file  Stress: Not on file  Social Connections: Not on file   Past Surgical History:  Procedure Laterality Date   ABDOMINAL AORTOGRAM W/LOWER EXTREMITY [name] 10/15/2018   Procedure: ABDOMINAL AORTOGRAM W/LOWER EXTREMITY;  Surgeon: Wellington Hampshire, MD;  Location: [place] [facility];  Service: Cardiovascular;  Laterality: [name];   ABDOMINAL AORTOGRAM W/LOWER EXTREMITY Bilateral 08/19/2019   Procedure: ABDOMINAL AORTOGRAM W/LOWER EXTREMITY;  Surgeon: Wellington Hampshire, MD;  Location: [place] [facility];  Service:  Cardiovascular;  Laterality: Bilateral;   CARDIAC CATHETERIZATION [name] 06/29/2016   Procedure: Left Heart Cath and Coronary Angiography;  Surgeon: Minna Merritts, MD;  Location: [place] [facility];  Service: Cardiovascular;  Laterality: [name];   CARDIAC CATHETERIZATION [name] 08/29/2016   Procedure: Left Heart Cath and Cors/Grafts Angiography;  Surgeon: Wellington Hampshire, MD;  Location: [place] [facility];  Service: Cardiovascular;  Laterality: [name];   CESAREAN SECTION     CHOLECYSTECTOMY     CORONARY ARTERY BYPASS GRAFT [name] 07/06/2016   Procedure: CORONARY ARTERY BYPASS GRAFTING (CABG) x four, using left internal mammary artery and right leg greater saphenous vein harvested endoscopically;  Surgeon: Ivin Poot, MD;  Location: [place];  Service: Open Heart Surgery;  Laterality: [name];    ENDARTERECTOMY Right 07/06/2016   Procedure: ENDARTERECTOMY CAROTID;  Surgeon: Rosetta Posner, MD;  Location: [facility];  Service: Vascular;  Laterality: Right;   ENDARTERECTOMY Right 04/27/2020   Procedure: REDO OF RIGHT ENDARTERECTOMY CAROTID;  Surgeon: Rosetta Posner, MD;  Location: [facility] OR;  Service: Vascular;  Laterality: Right;   LEFT HEART CATH AND CORS/GRAFTS ANGIOGRAPHY [name] 08/24/2020   Procedure: LEFT HEART CATH AND CORS/GRAFTS ANGIOGRAPHY;  Surgeon: Wellington Hampshire, MD;  Location: [place] [facility];  Service: Cardiovascular;  Laterality: [name];   PERIPHERAL VASCULAR CATHETERIZATION [name] 04/18/2016   Procedure: Renal Angiography;  Surgeon: Wellington Hampshire, MD;  Location: [place] [facility];  Service: Cardiovascular;  Laterality: [name];   PERIPHERAL VASCULAR INTERVENTION Left 10/15/2018   Procedure: PERIPHERAL VASCULAR INTERVENTION;  Surgeon: Wellington Hampshire, MD;  Location: [place] [facility];  Service: Cardiovascular;  Laterality: Left;  Left superficial femoral   TEE WITHOUT CARDIOVERSION [name] 07/06/2016   Procedure: TRANSESOPHAGEAL ECHOCARDIOGRAM (TEE);  Surgeon: Ivin Poot, MD;  Location: [name];  Service: Open Heart Surgery;  Laterality: [name];   TONSILLECTOMY     Past Surgical History:  Procedure Laterality Date   ABDOMINAL AORTOGRAM W/LOWER EXTREMITY [name] 10/15/2018   Procedure: ABDOMINAL AORTOGRAM W/LOWER EXTREMITY;  Surgeon: Wellington Hampshire, MD;  Location: [place] [facility];  Service: Cardiovascular;  Laterality: [name];   ABDOMINAL AORTOGRAM W/LOWER EXTREMITY Bilateral 08/19/2019   Procedure: ABDOMINAL AORTOGRAM W/LOWER EXTREMITY;  Surgeon: Wellington Hampshire, MD;  Location: [place] [facility];  Service: Cardiovascular;  Laterality: Bilateral;   CARDIAC CATHETERIZATION [name] 06/29/2016   Procedure: Left Heart Cath and Coronary Angiography;  Surgeon: Minna Merritts, MD;  Location: [place] [facility];  Service: Cardiovascular;  Laterality: [name];   CARDIAC CATHETERIZATION [name] 08/29/2016    Procedure: Left Heart Cath and Cors/Grafts Angiography;  Surgeon: Wellington Hampshire, MD;  Location: [place] [facility];  Service: Cardiovascular;  Laterality: [name];   CESAREAN SECTION     CHOLECYSTECTOMY     CORONARY ARTERY BYPASS GRAFT [name] 07/06/2016   Procedure: CORONARY ARTERY BYPASS GRAFTING (CABG) x four, using left internal mammary artery and right leg greater saphenous vein harvested endoscopically;  Surgeon: Ivin Poot, MD;  Location: [place];  Service: Open Heart Surgery;  Laterality: [name];   ENDARTERECTOMY Right 07/06/2016   Procedure: ENDARTERECTOMY CAROTID;  Surgeon: Rosetta Posner, MD;  Location: [facility] OR;  Service: Vascular;  Laterality: Right;   ENDARTERECTOMY Right 04/27/2020   Procedure: REDO OF RIGHT ENDARTERECTOMY CAROTID;  Surgeon: Rosetta Posner, MD;  Location: [facility] OR;  Service: Vascular;  Laterality: Right;   LEFT HEART CATH AND CORS/GRAFTS ANGIOGRAPHY [name] 08/24/2020   Procedure: LEFT HEART CATH AND CORS/GRAFTS ANGIOGRAPHY;  Surgeon: Wellington Hampshire, MD;  Location: [place] [facility];  Service: Cardiovascular;  Laterality: [name];   PERIPHERAL VASCULAR CATHETERIZATION [name] 04/18/2016   Procedure: Renal Angiography;  Surgeon: Wellington Hampshire, MD;  Location: [place] [facility];  Service: Cardiovascular;  Laterality: [name];   PERIPHERAL VASCULAR INTERVENTION Left 10/15/2018   Procedure: PERIPHERAL VASCULAR INTERVENTION;  Surgeon: Wellington Hampshire, MD;  Location: [place] [facility];  Service: Cardiovascular;  Laterality: Left;  Left superficial femoral   TEE WITHOUT CARDIOVERSION [name] 07/06/2016   Procedure: TRANSESOPHAGEAL ECHOCARDIOGRAM (TEE);  Surgeon: Ivin Poot, MD;  Location: [place];  Service: Open Heart Surgery;  Laterality: [name];   TONSILLECTOMY     Past Medical History:  Diagnosis Date   Arthralgia of temporomandibular joint    CAD, multiple vessel    a. 06/2016 Cath: ostLM 40%, ostLAD 40%, pLAD 95%, ost-pLCx 60%, pLCx 95%, mLCx 60%, mRCA 95%, D2 50%, LVSF nl;  b. 07/2016 CABG x 4  (LIMA->LAD, VG->Diag, VG->OM, VG->RCA); c. 08/2016 Cath: 3VD w/ 4/4 patent grafts. LAD distal to LIMA has diff dzs->Med rx; d. 08/2020 Cath: 4/4 patent grafts, native 3VD. EF 55-65%-->Med Rx.   Carotid arterial disease ([place])    a. 07/2016 s/p R CEA; b. 02/2021 U/S: RICA 123456, LICA 123456.   Clotting disorder ([place])    Depression    Diastolic dysfunction    a. 06/2016 Echo: EF 50-55%, mild inf wall HK, GR1DD, mild MR, RV sys fxn nl, mildly dilated LA, PASP nl   Fatty liver disease, nonalcoholic [id number]   History of blood transfusion    with heart surgery   HLD (hyperlipidemia)    Labile hypertension    a. prior renal ngiogram negative for RAS in 03/2016; b. catecholamines and metanephrines normal, mildly elevated renin with normal aldosterone and normal ratio in 02/2016   Myocardial infarction [facility]) 2017   Obesity    PAD (peripheral artery disease) ([place])    a. 09/2018 s/p L SFA stenting; b. 07/2019 Periph Angio: Patent m/d L SFA stent w/ 100% L SFA distal to stent. L AT 100d, L Peroneal diff dzs-->Med Rx; c. 02/2021 ABIs: stable @ 0.61 on R and 0.46 on L.   PTSD (post-traumatic stress disorder)    Tobacco abuse    Type 2 diabetes mellitus ([place]) 12/2015   BP 128/86   Pulse 91   Temp 98.9 F (37.2 C)   Ht '5\' 6"'$  (1.676 m)   Wt 203 lb 6.4 oz (92.3 kg)   BMI 32.83 kg/m   Opioid Risk Score:   Fall Risk Score:  `1  Depression screen PHQ 2/9  Depression screen [facility] 2/9 09/06/2021 08/09/2021 05/10/2021 02/23/2021 02/20/2021 11/23/2020 11/21/2020  Decreased Interest 3 2 0 0 0 0 2  Down, Depressed, Hopeless 3 3 0 0 0 0 3  PHQ - 2 Score 6 5 0 0 0 0 5  Altered sleeping - 0 - - - - 3  Tired, decreased energy - 3 - - - - 2  Change in appetite - 2 - - - - 2  Feeling bad or failure about yourself  - 3 - - - - 2  Trouble concentrating - 3 - - - - 2  Moving slowly or fidgety/restless - 2 - - - - 1  Suicidal thoughts - 0 - - - - 1  PHQ-9 Score - 18 - - - - 18  Difficult doing work/chores - Very difficult - - -  - Very difficult  Some  recent data might be hidden     Review of Systems  Constitutional: Negative.   HENT: Negative.    Eyes: Negative.   Respiratory: Negative.    Cardiovascular: Negative.   Gastrointestinal: Negative.   Endocrine: Negative.   Genitourinary: Negative.   Musculoskeletal:  Positive for gait problem.  Skin: Negative.   Allergic/Immunologic: Negative.   Neurological:  Positive for headaches.  Hematological: Negative.   Psychiatric/Behavioral:  Positive for dysphoric mood.   All other systems reviewed and are negative.     Objective:   Physical Exam Vitals and nursing note reviewed.  Constitutional:      Appearance: Normal appearance.  Cardiovascular:     Rate and Rhythm: Normal rate and regular rhythm.     Pulses: Normal pulses.     Heart sounds: Normal heart sounds.  Pulmonary:     Effort: Pulmonary effort is normal.     Breath sounds: Normal breath sounds.  Musculoskeletal:     Cervical back: Normal range of motion and neck supple.     Comments: Normal Muscle Bulk and Muscle Testing Reveals:  Upper Extremities: Right: Full ROM and Muscle Strength 5/5 Left Upper Extremity: Decreased ROM 20 Degrees and Muscle Strength 0/5 Wearing Left Hand Splint  Lumbar Paraspinal Tenderness: L-4-L-5 Lower Extremities: Right Full ROM and Muscle Strength 5/5 Left: Decreased ROM and Muscle Strength 4/5  Wearing AFO Arrived in wheelchair    Skin:    General: Skin is warm and dry.  Neurological:     Mental Status: She is alert and oriented to person, place, and time.  Psychiatric:        Mood and Affect: Mood normal.        Behavior: Behavior normal.         Assessment & Plan:  Cerebrovascular Accident: Neurology Following. Continue current medication regimen. Continue to Monitor.  Delirium: Neurology Following. Continue to Monitor.  Bilateral Lower Extremities Pain: Continue HEP as Tolerated. Continue to Monitor.  Chronic Pain Refilled: Oxycodone 5 mg daily  as needed for pain . #30. We will continue the opioid monitoring program, this consists of regular clinic visits, examinations, urine drug screen, pill counts as well as use of [place] Controlled Substance Reporting system. A 12 month History has been reviewed on the [place] Controlled Substance Reporting System on 09/11/2021   Neuropathy: Continue Lyrica. Continue to Monitor.   F/U in 1 month

## 2021-09-07 ENCOUNTER — Encounter: Payer: Managed Care, Other (non HMO) | Admitting: Occupational Therapy

## 2021-09-07 ENCOUNTER — Encounter: Payer: Managed Care, Other (non HMO) | Admitting: Speech Pathology

## 2021-09-07 ENCOUNTER — Encounter: Payer: Self-pay | Admitting: Internal Medicine

## 2021-09-07 ENCOUNTER — Telehealth (INDEPENDENT_AMBULATORY_CARE_PROVIDER_SITE_OTHER): Payer: Managed Care, Other (non HMO) | Admitting: Internal Medicine

## 2021-09-07 VITALS — BP 128/86 | HR 91 | Wt 205.0 lb

## 2021-09-07 DIAGNOSIS — Z8673 Personal history of transient ischemic attack (TIA), and cerebral infarction without residual deficits: Secondary | ICD-10-CM

## 2021-09-07 DIAGNOSIS — E1151 Type 2 diabetes mellitus with diabetic peripheral angiopathy without gangrene: Secondary | ICD-10-CM

## 2021-09-07 DIAGNOSIS — Z951 Presence of aortocoronary bypass graft: Secondary | ICD-10-CM

## 2021-09-07 DIAGNOSIS — I6523 Occlusion and stenosis of bilateral carotid arteries: Secondary | ICD-10-CM

## 2021-09-07 DIAGNOSIS — E785 Hyperlipidemia, unspecified: Secondary | ICD-10-CM

## 2021-09-07 MED ORDER — REPATHA SURECLICK 140 MG/ML ~~LOC~~ SOAJ: 1.0000 | SUBCUTANEOUS | 3 refills | Status: DC

## 2021-09-07 MED ORDER — ICOSAPENT ETHYL 1 G PO CAPS: 2.0000 g | ORAL_CAPSULE | Freq: Two times a day (BID) | ORAL | 3 refills | Status: DC

## 2021-09-07 NOTE — Patient Instructions (Signed)
Medication Instructions:  Your physician recommends that you continue on your current medications as directed. Please refer to the Current Medication list given to you today.  *If you need a refill on your cardiac medications before your next appointment, please call your pharmacy*   Lab Work: FASTING lipid panel in 3-4 months to check cholesterol   If you have labs (blood work) drawn today and your tests are completely normal, you will receive your results only by: [place] (if you have MyChart) OR A paper copy in the mail If you have any lab test that is abnormal or we need to change your treatment, we will call you to review the results.   Testing/Procedures: NONE   Follow-Up: At [facility], you and your health needs are our priority.  As part of our continuing mission to provide you with exceptional heart care, we have created designated Provider Care Teams.  These Care Teams include your primary Cardiologist (physician) and Advanced Practice Providers (APPs -  Physician Assistants and Nurse Practitioners) who all work together to provide you with the care you need, when you need it.  We recommend signing up for the patient portal called "MyChart".  Sign up information is provided on this After Visit Summary.  MyChart is used to connect with patients for Virtual Visits (Telemedicine).  Patients are able to view lab/test results, encounter notes, upcoming appointments, etc.  Non-urgent messages can be sent to your provider as well.   To learn more about what you can do with MyChart, go to [email or web address].    Your next appointment:   3-4 month(s) - lipid clinic  The format for your next appointment:   In Person  Provider:   K. Mali Hilty, MD

## 2021-09-07 NOTE — Progress Notes (Signed)
Virtual Visit via Telephone Note   This visit type was conducted due to national recommendations for restrictions regarding the COVID-19 Pandemic (e.g. social distancing) in an effort to limit this patient's exposure and mitigate transmission in our community.  Due to her co-morbid illnesses, this patient is at least at moderate risk for complications without adequate follow up.  This format is felt to be most appropriate for this patient at this time.  All issues noted in this document were discussed and addressed.  No physical exam was performed with this format.  Please refer to the patient's chart for her consent to telehealth for [facility].      Date:  09/07/2021   ID:  Erika [name], DOB 1971-04-19, MRN [id number] The patient was identified using 2 identifiers.  Evaluation Performed:  New Patient Evaluation  Patient Location:  [place] [id number]  Provider location:   [address], [place] [address]  PCP:  Lesleigh Noe, MD  Cardiologist:  [name], MD Electrophysiologist:  None   Chief Complaint:  Manage dyslipidemia  History of Present Illness:    Erika [name] is a 50 y.o. female who presents for a telehealth visit today.  This is a pleasant 50 year old female kindly referred by Dr. Fletcher Anon for evaluation and management of dyslipidemia.  She has an unfortunate history of very early onset heart disease including PAD and coronary artery disease status post CABG.  There is a strong family history of heart disease particularly in her father.  She also notably has hypertension, nonalcoholic fatty liver disease, tobacco abuse and type 2 diabetes insulin.  Recently she had repeat lipids which demonstrated of 112 in November 2021, and TC 214, triglycerides 488, HDL 42.  1C at that time was poorly controlled at 10.3 and may explain her high triglycerides.  Her cardiologist/PA-C then further adjusted her medications,  starting her on Repatha 140 mg every 2 weeks and switching her statin to rosuvastatin 20 mg daily in December and January/February respectively.  She has not had repeat lipid testing since then.  09/07/2021  Erika [name] returns today for a telehealth visit.  Unfortunately were unable to connect via video but did have an audio visit.  She had repeat lipid testing this summer which showed poorly controlled dyslipidemia.  Is hospitalized this summer for pontine stroke.  She had come off of her Repatha for unknown reasons and saw Dr. Sophronia Simas back in follow-up.  She does have PAD and CAD and prior CABG.  Her triglycerides were even higher during this admission at 636, total cholesterol 202 and HDL 29.  Direct LDL was 99.6.  Currently she says she is taking simvastatin 20 mg daily and Zetia 10 mg daily but was supposed to start on Vascepa 1 g twice daily.  She never got this prescription filled.  Also she was previously on Repatha and is not clear that she is currently taking that.   The patient does not have symptoms concerning for COVID-19 infection (fever, chills, cough, or new SHORTNESS OF BREATH).    Prior CV studies:   The following studies were reviewed today:  Chart reviewed, lab work  PMHx:  Past Medical History:  Diagnosis Date   Arthralgia of temporomandibular joint    CAD, multiple vessel    a. 06/2016 Cath: ostLM 40%, ostLAD 40%, pLAD 95%, ost-pLCx 60%, pLCx 95%, mLCx 60%, mRCA 95%, D2 50%, LVSF nl;  b. 07/2016 CABG x 4 (LIMA->LAD,  VG->Diag, VG->OM, VG->RCA); c. 08/2016 Cath: 3VD w/ 4/4 patent grafts. LAD distal to LIMA has diff dzs->Med rx; d. 08/2020 Cath: 4/4 patent grafts, native 3VD. EF 55-65%-->Med Rx.   Carotid arterial disease ([place])    a. 07/2016 s/p R CEA; b. 02/2021 U/S: RICA 123456, LICA 123456.   Clotting disorder ([place])    Depression    Diastolic dysfunction    a. 06/2016 Echo: EF 50-55%, mild inf wall HK, GR1DD, mild MR, RV sys fxn nl, mildly dilated LA, PASP nl   Fatty liver  disease, nonalcoholic [id number]   History of blood transfusion    with heart surgery   HLD (hyperlipidemia)    Labile hypertension    a. prior renal ngiogram negative for RAS in 03/2016; b. catecholamines and metanephrines normal, mildly elevated renin with normal aldosterone and normal ratio in 02/2016   Myocardial infarction [facility]) 2017   Obesity    PAD (peripheral artery disease) ([place])    a. 09/2018 s/p L SFA stenting; b. 07/2019 Periph Angio: Patent m/d L SFA stent w/ 100% L SFA distal to stent. L AT 100d, L Peroneal diff dzs-->Med Rx; c. 02/2021 ABIs: stable @ 0.61 on R and 0.46 on L.   PTSD (post-traumatic stress disorder)    Tobacco abuse    Type 2 diabetes mellitus ([place]) 12/2015    Past Surgical History:  Procedure Laterality Date   ABDOMINAL AORTOGRAM W/LOWER EXTREMITY [name] 10/15/2018   Procedure: ABDOMINAL AORTOGRAM W/LOWER EXTREMITY;  Surgeon: Wellington Hampshire, MD;  Location: [place] [facility];  Service: Cardiovascular;  Laterality: [name];   ABDOMINAL AORTOGRAM W/LOWER EXTREMITY Bilateral 08/19/2019   Procedure: ABDOMINAL AORTOGRAM W/LOWER EXTREMITY;  Surgeon: Wellington Hampshire, MD;  Location: [place] [facility];  Service: Cardiovascular;  Laterality: Bilateral;   CARDIAC CATHETERIZATION [name] 06/29/2016   Procedure: Left Heart Cath and Coronary Angiography;  Surgeon: Minna Merritts, MD;  Location: [place] [facility];  Service: Cardiovascular;  Laterality: [name];   CARDIAC CATHETERIZATION [name] 08/29/2016   Procedure: Left Heart Cath and Cors/Grafts Angiography;  Surgeon: Wellington Hampshire, MD;  Location: [place] [facility];  Service: Cardiovascular;  Laterality: [name];   CESAREAN SECTION     CHOLECYSTECTOMY     CORONARY ARTERY BYPASS GRAFT [name] 07/06/2016   Procedure: CORONARY ARTERY BYPASS GRAFTING (CABG) x four, using left internal mammary artery and right leg greater saphenous vein harvested endoscopically;  Surgeon: Ivin Poot, MD;  Location: [place];  Service: Open Heart Surgery;   Laterality: [name];   ENDARTERECTOMY Right 07/06/2016   Procedure: ENDARTERECTOMY CAROTID;  Surgeon: Rosetta Posner, MD;  Location: [facility] OR;  Service: Vascular;  Laterality: Right;   ENDARTERECTOMY Right 04/27/2020   Procedure: REDO OF RIGHT ENDARTERECTOMY CAROTID;  Surgeon: Rosetta Posner, MD;  Location: [facility] OR;  Service: Vascular;  Laterality: Right;   LEFT HEART CATH AND CORS/GRAFTS ANGIOGRAPHY [name] 08/24/2020   Procedure: LEFT HEART CATH AND CORS/GRAFTS ANGIOGRAPHY;  Surgeon: Wellington Hampshire, MD;  Location: [place] [facility];  Service: Cardiovascular;  Laterality: [name];   PERIPHERAL VASCULAR CATHETERIZATION [name] 04/18/2016   Procedure: Renal Angiography;  Surgeon: Wellington Hampshire, MD;  Location: [place] [facility];  Service: Cardiovascular;  Laterality: [name];   PERIPHERAL VASCULAR INTERVENTION Left 10/15/2018   Procedure: PERIPHERAL VASCULAR INTERVENTION;  Surgeon: Wellington Hampshire, MD;  Location: [place] [facility];  Service: Cardiovascular;  Laterality: Left;  Left superficial femoral   TEE WITHOUT CARDIOVERSION [name] 07/06/2016   Procedure: TRANSESOPHAGEAL ECHOCARDIOGRAM (  TEE);  Surgeon: Ivin Poot, MD;  Location: [place];  Service: Open Heart Surgery;  Laterality: [name];   TONSILLECTOMY      FAMHx:  Family History  Adopted: Yes  Problem Relation Age of Onset   Diabetes Mother    Diabetes Father    Alcohol abuse Father    Heart disease Father    Drug abuse Father    Stroke Sister    Anxiety disorder Sister     SOCHx:   reports that she has been smoking cigarettes. She has a 13.50 pack-year smoking history. She has never used smokeless tobacco. She reports that she does not currently use alcohol. She reports that she does not use drugs.  ALLERGIES:  Allergies  Allergen Reactions   Chantix [Varenicline Tartrate] Other (See Comments)    Feels "crazy" and angry     MEDS:  Current Meds  Medication Sig   acetaminophen (TYLENOL) 325 MG tablet Take 1-2 tablets (325-650 mg total) by mouth  every 4 (four) hours as needed for mild pain.   albuterol (PROVENTIL HFA;VENTOLIN HFA) 108 (90 Base) MCG/ACT inhaler Inhale 2 puffs into the lungs every 6 (six) hours as needed for wheezing.   aspirin 81 MG chewable tablet Chew 1 tablet (81 mg total) by mouth daily.   benztropine (COGENTIN) 0.5 MG tablet Take 1 tablet (0.5 mg total) by mouth at bedtime.   carvedilol (COREG) 6.25 MG tablet Take 1 tablet (6.25 mg total) by mouth 2 (two) times daily.   chlorproMAZINE (THORAZINE) 100 MG tablet TAKE 1 TABLET BY MOUTH EVERYDAY AT BEDTIME   clonazePAM (KLONOPIN) 0.5 MG tablet Take 1 tablet (0.5 mg total) by mouth 2 (two) times daily.   clopidogrel (PLAVIX) 75 MG tablet Take 1 tablet (75 mg total) by mouth daily.   Continuous Blood Gluc Sensor (DEXCOM G6 SENSOR) MISC 1 Dose by Does not apply route daily.   Continuous Blood Gluc Transmit (DEXCOM G6 TRANSMITTER) MISC 1 Units by Does not apply route daily.   cyanocobalamin (,VITAMIN B-12,) 1000 MCG/ML injection Inject 1 mL (1,000 mcg total) into the skin every 30 (thirty) days.   Dulaglutide (TRULICITY) A999333 0000000 SOPN Inject 0.75 mg into the skin once a week.   Evolocumab (REPATHA SURECLICK) XX123456 MG/ML SOAJ Inject 1 Dose into the skin every 14 (fourteen) days.   ezetimibe (ZETIA) 10 MG tablet Take 1 tablet (10 mg total) by mouth daily.   FLUoxetine HCl 60 MG TABS Take 60 mg by mouth daily.   guaiFENesin (MUCINEX) 600 MG 12 hr tablet Take 1 tablet (600 mg total) by mouth 2 (two) times daily.   icosapent Ethyl (VASCEPA) 1 g capsule Take 2 capsules (2 g total) by mouth 2 (two) times daily.   Incontinence Supply Disposable (DEPEND ADJUSTABLE UNDERWEAR LG) MISC 1 Units by Does not apply route daily.   insulin glargine-yfgn (SEMGLEE, YFGN,) 100 UNIT/ML Pen Inject 54 Units into the skin in the morning. And pen needles 1/day   Insulin Syringe-Needle U-100 29G X 1/2" 0.3 ML MISC 1 each by Does not apply route 2 (two) times daily.   isosorbide mononitrate (IMDUR)  60 MG 24 hr tablet TAKE ONE TABLET BY MOUTH TWICE A DAY   lamoTRIgine (LAMICTAL) 200 MG tablet Take 1 tablet (200 mg total) by mouth at bedtime.   naloxone (NARCAN) nasal spray 4 mg/0.1 mL Place 1 spray into the nose as needed for up to 365 doses (for opioid-induced respiratory depresssion). In case of emergency (overdose), spray once into  each nostril. If no response within 3 minutes, repeat application and call [phone].   nicotine (NICODERM CQ - DOSED IN MG/24 HOURS) 21 mg/24hr patch Place 1 patch (21 mg total) onto the skin daily.   nitroGLYCERIN (NITROSTAT) 0.4 MG SL tablet Place 1 tablet (0.4 mg total) under the tongue every 5 (five) minutes as needed for chest pain.   ondansetron (ZOFRAN) 4 MG tablet Take 1 tablet (4 mg total) by mouth daily as needed for nausea or vomiting.   oxyCODONE (OXY IR/ROXICODONE) 5 MG immediate release tablet Take 1 tablet (5 mg total) by mouth daily as needed for severe pain. Must last 30 days.   pantoprazole (PROTONIX) 40 MG tablet Take 1 tablet (40 mg total) by mouth daily at 12 noon.   polyethylene glycol (MIRALAX / GLYCOLAX) 17 g packet Take 17 g by mouth 2 (two) times daily.   potassium chloride (KLOR-CON) 20 MEQ packet Take 20 mEq by mouth daily.   pregabalin (LYRICA) 225 MG capsule Take 1 capsule (225 mg total) by mouth 2 (two) times daily.   rosuvastatin (CRESTOR) 20 MG tablet Take 1 tablet (20 mg total) by mouth daily.   senna-docusate (SENOKOT-S) 8.6-50 MG tablet Take 2 tablets by mouth 2 (two) times daily.   spironolactone (ALDACTONE) 25 MG tablet TAKE ONE TABLET BY MOUTH ONE TIME DAILY   vitamin B-12 1000 MCG tablet Take 1 tablet (1,000 mcg total) by mouth daily.   Vitamin D, Ergocalciferol, (DRISDOL) 1.25 MG (50000 UNIT) CAPS capsule Take 1 capsule (50,000 Units total) by mouth every 7 (seven) days.     ROS: Pertinent items noted in HPI and remainder of comprehensive ROS otherwise negative.  Labs/Other Tests and Data Reviewed:    Recent  Labs: 06/24/2021: B Natriuretic Peptide 33.3 06/28/2021: TSH 1.950 07/10/2021: ALT 23; Magnesium 1.9 07/31/2021: BUN 15; Creatinine, Ser 1.01; Hemoglobin 12.7; Platelets 325; Potassium 3.8; Sodium 138   Recent Lipid Panel Lab Results  Component Value Date/Time   CHOL 202 (H) 06/25/2021 07:40 AM   CHOL 121 08/21/2016 08:17 AM   TRIG 636 (H) 06/25/2021 07:40 AM   HDL 29 (L) 06/25/2021 07:40 AM   HDL 24 (L) 08/21/2016 08:17 AM   CHOLHDL 7.0 06/25/2021 07:40 AM   LDLCALC UNABLE TO CALCULATE IF TRIGLYCERIDE OVER 400 mg/dL 06/25/2021 07:40 AM   LDLCALC 64 08/21/2016 08:17 AM   LDLDIRECT 99.6 (H) 06/25/2021 07:40 AM    Wt Readings from Last 3 Encounters:  09/07/21 205 lb (93 kg)  09/06/21 203 lb 6.4 oz (92.3 kg)  08/22/21 213 lb (96.6 kg)     Exam:    Vital Signs:  BP 128/86   Pulse 91   Wt 205 lb (93 kg)   BMI 33.09 kg/m    No exam due to telephone visit  ASSESSMENT & PLAN:    Mixed dyslipidemia with high triglycerides, goal LDL less than 70 Coronary artery disease status post CABG PAD with occluded right SFA Type 2 diabetes on insulin Tobacco abuse Family history of premature coronary disease Recent stroke  Erika [name] unfortunately had a recent stroke.  Her cholesterol remains uncontrolled particular with very high triglycerides.  She never started on Vascepa which we had recommended.  I did call in prescription for that again today and recommended 2 g twice daily.  In addition she will need to restart the Repatha to try to target her LDL to less than 70.  She will continue on rosuvastatin and ezetimibe.  We will plan repeat lipids  in about 3 months and follow-up with me in the office.  COVID-19 Education: The signs and symptoms of COVID-19 were discussed with the patient and how to seek care for testing (follow up with PCP or arrange E-visit).  The importance of social distancing was discussed today.  Patient Risk:   After full review of this patients clinical status, I  feel that they are at least moderate risk at this time.  Time:   Today, I have spent 25 minutes with the patient with telehealth technology discussing dyslipidemia, coronary disease, PAD, type 2 diabetes, cardiovascular risk.     Medication Adjustments/Labs and Tests Ordered: Current medicines are reviewed at length with the patient today.  Concerns regarding medicines are outlined above.   Tests Ordered: Orders Placed This Encounter  Procedures   Lipid panel     Medication Changes: Meds ordered this encounter  Medications   icosapent Ethyl (VASCEPA) 1 g capsule    Sig: Take 2 capsules (2 g total) by mouth 2 (two) times daily.    Dispense:  360 capsule    Refill:  3   Evolocumab (REPATHA SURECLICK) [id number] MG/ML SOAJ    Sig: Inject 1 Dose into the skin every 14 (fourteen) days.    Dispense:  6 mL    Refill:  3     Disposition:  in 3 month(s)  Pixie Casino, MD, [facility], [place] Director of the Advanced Lipid Disorders &  Cardiovascular Risk Reduction Clinic Diplomate of the American Board of Clinical Lipidology Attending Cardiologist  Direct Dial: [phone]  Fax: [phone]  Website:  [email or web address]  Pixie Casino, MD  09/07/2021 10:24 AM

## 2021-09-08 ENCOUNTER — Ambulatory Visit
Admission: RE | Admit: 2021-09-08 | Discharge: 2021-09-08 | Disposition: A | Discharge status: Home / Self Care | Payer: Managed Care, Other (non HMO) | Source: Home / Self Care | Attending: Physical Medicine and Rehabilitation | Admitting: Physical Medicine and Rehabilitation

## 2021-09-08 ENCOUNTER — Other Ambulatory Visit: Payer: Self-pay

## 2021-09-08 ENCOUNTER — Ambulatory Visit
Admission: RE | Admit: 2021-09-08 | Discharge: 2021-09-08 | Disposition: A | Discharge status: Home / Self Care | Payer: Managed Care, Other (non HMO) | Source: Ambulatory Visit | Attending: Physical Medicine and Rehabilitation | Admitting: Physical Medicine and Rehabilitation

## 2021-09-08 DIAGNOSIS — R059 Cough, unspecified: Secondary | ICD-10-CM

## 2021-09-08 DIAGNOSIS — R410 Disorientation, unspecified: Secondary | ICD-10-CM | POA: Diagnosis present

## 2021-09-10 ENCOUNTER — Other Ambulatory Visit: Payer: Self-pay | Admitting: Family Medicine

## 2021-09-11 ENCOUNTER — Encounter: Payer: Self-pay | Admitting: Neurology

## 2021-09-11 ENCOUNTER — Ambulatory Visit (INDEPENDENT_AMBULATORY_CARE_PROVIDER_SITE_OTHER): Payer: Managed Care, Other (non HMO) | Admitting: Neurology

## 2021-09-11 ENCOUNTER — Ambulatory Visit: Payer: Managed Care, Other (non HMO) | Admitting: Occupational Therapy

## 2021-09-11 ENCOUNTER — Ambulatory Visit: Payer: Managed Care, Other (non HMO) | Admitting: Physical Therapy

## 2021-09-11 ENCOUNTER — Other Ambulatory Visit: Payer: Self-pay

## 2021-09-11 VITALS — BP 132/81 | HR 89 | Ht 67.0 in | Wt 205.0 lb

## 2021-09-11 DIAGNOSIS — I69354 Hemiplegia and hemiparesis following cerebral infarction affecting left non-dominant side: Secondary | ICD-10-CM

## 2021-09-11 DIAGNOSIS — I6523 Occlusion and stenosis of bilateral carotid arteries: Secondary | ICD-10-CM

## 2021-09-11 DIAGNOSIS — I6350 Cerebral infarction due to unspecified occlusion or stenosis of unspecified cerebral artery: Secondary | ICD-10-CM

## 2021-09-11 DIAGNOSIS — Z9889 Other specified postprocedural states: Secondary | ICD-10-CM

## 2021-09-11 DIAGNOSIS — I63219 Cerebral infarction due to unspecified occlusion or stenosis of unspecified vertebral arteries: Secondary | ICD-10-CM | POA: Diagnosis not present

## 2021-09-11 DIAGNOSIS — G894 Chronic pain syndrome: Secondary | ICD-10-CM

## 2021-09-11 DIAGNOSIS — R278 Other lack of coordination: Secondary | ICD-10-CM

## 2021-09-11 DIAGNOSIS — R2681 Unsteadiness on feet: Secondary | ICD-10-CM

## 2021-09-11 DIAGNOSIS — M6281 Muscle weakness (generalized): Secondary | ICD-10-CM

## 2021-09-11 DIAGNOSIS — E1169 Type 2 diabetes mellitus with other specified complication: Secondary | ICD-10-CM

## 2021-09-11 DIAGNOSIS — R4189 Other symptoms and signs involving cognitive functions and awareness: Secondary | ICD-10-CM | POA: Insufficient documentation

## 2021-09-11 DIAGNOSIS — R41841 Cognitive communication deficit: Secondary | ICD-10-CM | POA: Diagnosis not present

## 2021-09-11 DIAGNOSIS — R269 Unspecified abnormalities of gait and mobility: Secondary | ICD-10-CM

## 2021-09-11 DIAGNOSIS — E559 Vitamin D deficiency, unspecified: Secondary | ICD-10-CM

## 2021-09-11 DIAGNOSIS — R5383 Other fatigue: Secondary | ICD-10-CM

## 2021-09-11 DIAGNOSIS — Z794 Long term (current) use of insulin: Secondary | ICD-10-CM

## 2021-09-11 NOTE — Therapy (Signed)
[place] MAIN [facility] SERVICES [address] [place], [place], [address] Phone: [phone]   Fax:  [phone]  Physical Therapy Treatment  Patient Details  Name: Erika [name] MRN: [id number] Date of Birth: 07/21/1971 Referring Provider (PT): Leeroy Cha MD   Encounter Date: 09/11/2021   PT End of Session - 09/11/21 1625     Visit Number 8    Number of Visits 20    Date for PT Re-Evaluation 09/14/21    Authorization Type [employer] max visits 20x per year speech, OT, PT    Authorization Time Period 08/03/21-10/26/21    Progress Note Due on Visit 10    PT Start Time 1603    PT Stop Time 1645    PT Time Calculation (min) 42 min    Equipment Utilized During Treatment Gait belt;Other (comment)   L AFO   Activity Tolerance Patient tolerated treatment well    Behavior During Therapy [facility] for tasks assessed/performed;Flat affect;Impulsive             Past Medical History:  Diagnosis Date   Arthralgia of temporomandibular joint    CAD, multiple vessel    a. 06/2016 Cath: ostLM 40%, ostLAD 40%, pLAD 95%, ost-pLCx 60%, pLCx 95%, mLCx 60%, mRCA 95%, D2 50%, LVSF nl;  b. 07/2016 CABG x 4 (LIMA->LAD, VG->Diag, VG->OM, VG->RCA); c. 08/2016 Cath: 3VD w/ 4/4 patent grafts. LAD distal to LIMA has diff dzs->Med rx; d. 08/2020 Cath: 4/4 patent grafts, native 3VD. EF 55-65%-->Med Rx.   Carotid arterial disease ([place])    a. 07/2016 s/p R CEA; b. 02/2021 U/S: RICA 123456, LICA 123456.   Clotting disorder ([place])    Depression    Diastolic dysfunction    a. 06/2016 Echo: EF 50-55%, mild inf wall HK, GR1DD, mild MR, RV sys fxn nl, mildly dilated LA, PASP nl   Fatty liver disease, nonalcoholic Q000111Q   History of blood transfusion    with heart surgery   HLD (hyperlipidemia)    Labile hypertension    a. prior renal ngiogram negative for RAS in 03/2016; b. catecholamines and metanephrines normal, mildly elevated renin with normal aldosterone and normal ratio in  02/2016   Myocardial infarction [facility]) 2017   Obesity    PAD (peripheral artery disease) ([place])    a. 09/2018 s/p L SFA stenting; b. 07/2019 Periph Angio: Patent m/d L SFA stent w/ 100% L SFA distal to stent. L AT 100d, L Peroneal diff dzs-->Med Rx; c. 02/2021 ABIs: stable @ 0.61 on R and 0.46 on L.   PTSD (post-traumatic stress disorder)    Tobacco abuse    Type 2 diabetes mellitus ([place]) 12/2015    Past Surgical History:  Procedure Laterality Date   ABDOMINAL AORTOGRAM W/LOWER EXTREMITY [name] 10/15/2018   Procedure: ABDOMINAL AORTOGRAM W/LOWER EXTREMITY;  Surgeon: Wellington Hampshire, MD;  Location: [place] CV LAB;  Service: Cardiovascular;  Laterality: [name];   ABDOMINAL AORTOGRAM W/LOWER EXTREMITY Bilateral 08/19/2019   Procedure: ABDOMINAL AORTOGRAM W/LOWER EXTREMITY;  Surgeon: Wellington Hampshire, MD;  Location: [place] CV LAB;  Service: Cardiovascular;  Laterality: Bilateral;   CARDIAC CATHETERIZATION [name] 06/29/2016   Procedure: Left Heart Cath and Coronary Angiography;  Surgeon: Minna Merritts, MD;  Location: [place] CV LAB;  Service: Cardiovascular;  Laterality: [name];   CARDIAC CATHETERIZATION [name] 08/29/2016   Procedure: Left Heart Cath and Cors/Grafts Angiography;  Surgeon: Wellington Hampshire, MD;  Location: [place] CV LAB;  Service: Cardiovascular;  Laterality: [name];  CESAREAN SECTION     CHOLECYSTECTOMY     CORONARY ARTERY BYPASS GRAFT [name] 07/06/2016   Procedure: CORONARY ARTERY BYPASS GRAFTING (CABG) x four, using left internal mammary artery and right leg greater saphenous vein harvested endoscopically;  Surgeon: Ivin Poot, MD;  Location: [place];  Service: Open Heart Surgery;  Laterality: [name];   ENDARTERECTOMY Right 07/06/2016   Procedure: ENDARTERECTOMY CAROTID;  Surgeon: Rosetta Posner, MD;  Location: [facility] OR;  Service: Vascular;  Laterality: Right;   ENDARTERECTOMY Right 04/27/2020   Procedure: REDO OF RIGHT ENDARTERECTOMY CAROTID;  Surgeon: Rosetta Posner, MD;  Location: [facility] OR;   Service: Vascular;  Laterality: Right;   LEFT HEART CATH AND CORS/GRAFTS ANGIOGRAPHY [name] 08/24/2020   Procedure: LEFT HEART CATH AND CORS/GRAFTS ANGIOGRAPHY;  Surgeon: Wellington Hampshire, MD;  Location: [employer] CV LAB;  Service: Cardiovascular;  Laterality: [name];   PERIPHERAL VASCULAR CATHETERIZATION [name] 04/18/2016   Procedure: Renal Angiography;  Surgeon: Wellington Hampshire, MD;  Location: [place] CV LAB;  Service: Cardiovascular;  Laterality: [name];   PERIPHERAL VASCULAR INTERVENTION Left 10/15/2018   Procedure: PERIPHERAL VASCULAR INTERVENTION;  Surgeon: Wellington Hampshire, MD;  Location: [place] CV LAB;  Service: Cardiovascular;  Laterality: Left;  Left superficial femoral   TEE WITHOUT CARDIOVERSION [name] 07/06/2016   Procedure: TRANSESOPHAGEAL ECHOCARDIOGRAM (TEE);  Surgeon: Ivin Poot, MD;  Location: [place];  Service: Open Heart Surgery;  Laterality: [name];   TONSILLECTOMY      There were no vitals filed for this visit.   Subjective Assessment - 09/11/21 1606     Subjective Pt reports appointment with neurologist did not go very well earlier today. She reports he instructed her she would not be able to walk again and would be in th echair for the rest of her life and she would not regain the funciton in her shoulder. Pt reports she was having a lot of falls last week and although she did not sustain any new injuries. She reports her daughter has helped her transfer but has resulted in a few falls due to her daughter's inability to properly guard and manage her as she transitions from the bed or WC to bedside commode. Pt reports she has ben able to walk some more with the hemiwalker and she does not recall any falls while she has been utilizinf the [employer].    Pertinent History Pt presented to [facility]24[facility]21 c c/c of slurred speech, L facial droop & LUE weakness. MRI revealed acute R pontine perforator infarct with chronic R parietal & occipital infarcts. PMH: HTN, HLD, DM, tobacco abuse,  PAD, CAD, CABG, dCHF, peptic foot drop, renal artery stenosis, MI and CAD (s/p of R CEA 2017), PTSD, adjustment disorder, obesity, Left hip OA s/p OPPT 2020. Pt DC 06/28/21 to inpatient rehab in [place], [place] to home 08/01/21. AT CIR pt worked extensively on STS with MinA or Supervision and HW, Stand and squat pivot transfers with MinA, AMB progressed to ~19f max, mostly with HW (some RW use with fixed LUE on handle), but was mostly limited in further progression of distance 2/2 chronic PAD ischaemic pain or Left knee buckling despite AFO use. CIR recommending update to more supporting AFO at transfer to OPPT. Since home, pt is using WC to navigate the environment, family assists with ADL performance, WC still needs to be modified to fit through BR doorframe. Pt reports not feeling safe with HW at present. Husband has been helping with gnelte stretching in mornings  as pt sleeps in a flexion synergy posture on LLE.    Limitations Walking;Standing    How long can you sit comfortably? Generally well tolerated with occasional buttocks soreness    How long can you stand comfortably? At eval: tolerates <2 minutes prior to ischemic leg pain    How long can you walk comfortably? Longest distance at CIR last month ~62f.    Patient Stated Goals Return indpendent ambulation    Pain Score 7     Pain Location Hip    Pain Orientation Left    Pain Descriptors / Indicators Aching    Pain Type Chronic pain    Pain Onset More than a month ago    Pain Frequency Constant    Aggravating Factors  sleeping on left side    Pain Relieving Factors pain meds    Multiple Pain Sites Yes    Pain Score 9    Pain Location Head    Pain Orientation Left    Pain Descriptors / Indicators Aching    Pain Type Chronic pain    Pain Frequency Intermittent            Treatment provided this session   Hamstring isometrics: 2 x 10 ea LE -cues for proper muscle activation -Dyna disc under heel          Neuro Re- Ed:   Hamstring isometrics: 2 x 10 ea LE -cues for proper muscle activation -Dyna disc under heel   4 x sidestepping length of mat table  -CGA for STS transition -CGA with sidestepping  -1 LOB posteriorly when stepping grom left to right but able to self correct with hip strategy with verbal cues from PT.   Transfer from seated on mat table to standing.  Ambulation utilizing hemiwalker and right hand 10 feet forward and around prone with focus on not hitting feet.  Walked back 10 feet to treatment mat table and transitioned from standing to sitting with contact-guard assist from therapist.  Transferred from seated position on mat table to standing with hemiwalker and right hand.  Ambulated to red count approximately 10 feet anteriorly walked around cone.  Patient coming with hemiwalker as she was utilizing proper spacing between hemiwalker and feet during this trial.  Ambulated back 10 feet to mat table.  Patient turned and walked other 8 feet to her wheelchair and transition from stand to sitting in wheelchair with contact-guard assist from therapist.   Pt required occasional rest breaks due fatigue, PT was quick to ask when pt appeared to be fatiguing in order to prevent excessive fatigue.  Unless otherwise stated, CGA was provided and gait belt donned in order to ensure pt safety  Pt educated throughout session about proper posture and technique with exercises. Improved exercise technique, movement at target joints, use of target muscles after min to mod verbal, visual, tactile cues.   Patient showed good effort in therapy today and was better able to recognize when she was losing her balance and was able to self-correct without assistance from therapist.             Note: Portions of this document were prepared using Dragon voice recognition software and although reviewed may contain unintentional dictation errors in syntax, grammar, or spelling.                PT  Education - 09/11/21 1624     Education Details safety with transfers at home    Person(s) Educated Patient  Methods Explanation;Demonstration;Tactile cues;Verbal cues    Comprehension Verbalized understanding;Returned demonstration;Verbal cues required;Tactile cues required              PT Short Term Goals - 08/03/21 1645       PT SHORT TERM GOAL #1   Title Pt to demonstrate improved tolerance to standing >3 minutes without physical assist or LOB. DME ad lib    Baseline <2 minutes at eval    Time 4    Period Weeks    Status New    Target Date 08/31/21      PT SHORT TERM GOAL #2   Title Pt to demonstrate perfromance of 5xSTS c AFO/Hemi walker at supervision level independence without LOB or physical assist.    Baseline Needs minGuard to MinA to perform at eval    Time 4    Period Weeks    Status New    Target Date 08/31/21      PT SHORT TERM GOAL #3   Title Pt to demonstrate AMB ~93f at supervision level with AFO, HW without LOB.    Baseline At eval limited by [employer] and AFO.    Time 6    Period Weeks    Status New    Target Date 09/14/21      PT SHORT TERM GOAL #4   Title Pt to demonstrate TUG test <120sec c HW and AFO at minguard - MinA level to improve independence with transfers and reduce falls risk at home.    Baseline No tug performed at eval:    Time 7    Period Weeks    Status New    Target Date 09/21/21               PT Long Term Goals - 08/03/21 1647       PT LONG TERM GOAL #1   Title Pt to demonstrate perfromance of 5xSTS c AFO/Hemi walker at supervision level independence without LOB or physical assist in less than 20sec.    Baseline Unable at eval, requires minguard to minA    Time 8    Period Weeks    Status New    Target Date 09/28/21      PT LONG TERM GOAL #2   Title Pt to demonstrate FOTO score improvement >10 points to demonstrate improved perception of ability in ADL    Baseline 23 at eval:    Time 8    Period Weeks     Status New    Target Date 09/28/21      PT LONG TERM GOAL #3   Title Pt able to demonstrate stand pivot transfer with HW and AFO without LOB at supervision level to modI.    Baseline Needs minGuard to MinA for safety    Time 8    Period Weeks    Status New    Target Date 09/28/21      PT LONG TERM GOAL #4   Title Pt to demonstrate TUG test <60sec c HW and AFO at supervision level to improve independence with transfers and reduce falls risk at home.    [facility]   Time 12    Period Weeks    Status New    Target Date 10/26/21                   Plan - 09/11/21 1627     Clinical Impression Statement Patient responded well to physical therapy treatment session today.  Patient able to perform some sidestepping without handhold assistance but did have contact-guard assistance for safety.  Patient only had 1 loss of balance and was able to self recover following verbal cues from therapist.  Patient also demonstrated improved accuracy with hemiwalker today has demonstrated by ability to walk around cone utilizing hemiwalker.  Without cues patient still tends to utilize hemiwalker within her line of gait but following cues patient better able to utilize hemiwalker in a safe pattern.  Patient will continue to benefit from skilled physical therapy intervention in order to improve her ambulatory capacity and improve her safety with transfers and walking within her home and to improve her overall quality of life.    Personal Factors and Comorbidities Age;Comorbidity 3+;Fitness;Past/Current Experience;Time since onset of injury/illness/exacerbation    Comorbidities DM, HTN, PVD, clotting disorder, CAD    Examination-Activity Limitations Squat;Stairs;Bathing;Bed Mobility;Dressing;Reach Overhead;Hygiene/Grooming;Bend;Carry;Locomotion Level;Transfers;Toileting;Stand    Examination-Participation Restrictions Cleaning;Community Activity;Occupation;Laundry;Driving;Yard Work     [employer] Evolving/Moderate complexity    Rehab Potential Good    PT Frequency 2x / week    PT Duration 12 weeks    PT Treatment/Interventions Electrical Stimulation;Gait training;Stair training;Functional mobility training;Therapeutic activities;Therapeutic exercise;Balance training;Neuromuscular re-education;Patient/family education;Manual techniques;Wheelchair mobility training;Energy conservation;Passive range of motion;Orthotic Fit/Training;DME Instruction;ADLs/Self Care Home Management    PT Next Visit Plan work toward supervision level STS and SPT c HW, sustained standing tolerance.    PT Home Exercise Plan Not updated this visit- Encouraged seated LE strengthening and practicing gait with husband.    Consulted and Agree with Plan of Care Patient;Family member/caregiver    Family Member Consulted husband             Patient will benefit from skilled therapeutic intervention in order to improve the following deficits and impairments:  Decreased endurance, Decreased mobility, Difficulty walking, Hypomobility, Decreased balance, Abnormal gait, Increased muscle spasms, Cardiopulmonary status limiting activity, Impaired flexibility, Hypermobility, Decreased strength, Decreased activity tolerance  Visit Diagnosis: Muscle weakness (generalized)  Abnormality of gait and mobility  Unsteadiness on feet  Other lack of coordination     Problem List Patient Active Problem List   Diagnosis Date Noted   Cerebrovascular accident (CVA) due to occlusion of vertebral artery ([place]) 09/11/2021   Cognitive change 09/11/2021   Cognitive communication deficit 08/10/2021   Urinary incontinence 08/10/2021   GERD (gastroesophageal reflux disease) 08/03/2021   Chronic post-traumatic stress disorder (PTSD)    Depression with anxiety    Right pontine stroke ([place]) 06/28/2021   Hemiplegia and hemiparesis following cerebral infarction affecting left non-dominant side ([place])  06/24/2021   Chronic diastolic CHF (congestive heart failure) ([place]) 06/24/2021   Fall 06/24/2021   Abnormal LFTs 06/24/2021   Olecranon bursitis of left elbow 06/12/2021   Chronic hip pain (Bilateral) 06/09/2021   Chronic use of opiate for therapeutic purpose 05/09/2021   Greater trochanteric bursitis of hip (Left) 03/09/2021   Bursitis of hip (Left) Q000111Q   Uncomplicated opioid dependence ([place]) 02/20/2021   Acute conjunctivitis of left eye 01/02/2021   Unintentional weight loss 11/21/2020   Broken teeth (Right) 08/02/2020   History of MI (myocardial infarction) (July 2017) 08/02/2020   Neuropathy 06/09/2020   Dental abscess 06/09/2020   Foot drop (Left) 06/09/2020   Carotid stenosis, asymptomatic, right 04/27/2020   Chronic migraine 11/03/2019   Thrombocytosis 09/16/2019   Erythrocytosis 09/16/2019   Hypercalcemia 09/06/2019   Leukocytosis 09/06/2019   Polycythemia 09/06/2019   Chronic hip pain (Left) 08/27/2019   Osteoarthritis of hip (Left) 08/27/2019   Gluteal tendonitis  of buttock (Left) 08/27/2019   Radial nerve palsy (Right) 07/15/2019   Neuropathy of radial nerve (Right) 06/30/2019   Abnormal bruising 06/25/2019   History of carotid endarterectomy (Right) 03/04/2019   Pain medication agreement signed 03/04/2019   Atypical facial pain (Right) 01/27/2019   Chronic ear pain (Right) 01/27/2019   Chronic jaw pain (Right) 01/27/2019   Geniculate Neuralgia (Right) 01/27/2019   Vitamin D deficiency 01/19/2019   Neurogenic pain 01/19/2019   Chronic anticoagulation (PLAVIX) 01/19/2019   Chronic pain syndrome 01/07/2019   Long term current use of opiate analgesic 01/07/2019   Long term prescription benzodiazepine use 01/07/2019   Pharmacologic therapy 01/07/2019   Disorder of skeletal system 01/07/2019   Problems influencing health status 01/07/2019   Chronic headaches (1ry area of Pain) (Right) 01/07/2019   Opiate use 09/24/2018   PVD (peripheral vascular disease)  ([place]) 06/24/2018   Chronic ankle pain (Bilateral) 12/27/2017   Tendinopathy of gluteus medius (Right) 12/27/2017   Tendinopathy of gluteus medius (Left) 12/27/2017   Bilateral hip pain 12/26/2017   Chronic elbow pain (Left) 10/17/2017   Elevated troponin I level 05/21/2017   Major depressive disorder, recurrent episode, moderate (HCC) 11/19/2016   Insomnia 10/30/2016   Constipation 07/25/2016   S/P CABG x 4 07/06/2016   Bradycardia    CAD (coronary artery disease)    Carotid stenosis    CAD in native artery 06/29/2016   Elevated troponin 06/28/2016   Essential hypertension, malignant 06/28/2016   Mild tobacco abuse in early remission 06/28/2016   Essential hypertension    Malignant hypertension    Type 2 diabetes mellitus with other specified complication ([place])    Chest pain with high risk for cardiac etiology 06/27/2016   NSTEMI (non-ST elevated myocardial infarction) ([place]) 06/27/2016   Proteinuria 03/15/2016   Renal artery stenosis ([place]) 03/15/2016   MDD (major depressive disorder) 10/17/2015   Agoraphobia with panic attacks 04/25/2015   HTN (hypertension), malignant 10/20/2013   Cluster headache 03/20/2012   HLD (hyperlipidemia) 07/26/2010   ADJUSTMENT DISORDER WITH MIXED FEATURES 07/26/2010    Particia Lather, PT 09/11/2021, 5:24 PM  [place] [facility] [place], [place], [address] Phone: [phone]   Fax:  [phone]  Name: Erika [name] MRN: [id number] Date of Birth: [birth year]

## 2021-09-11 NOTE — Progress Notes (Signed)
[facility]  PATIENT: Erika [name] DOB: 03/06/1971  REFERRING DOCTOR OR PCP: Reesa Chew, PA-C SOURCE: Patient, notes from hospital, imaging and laboratory reports, MRI images personally reviewed  _________________________________   HISTORICAL  CHIEF COMPLAINT:  Chief Complaint  Patient presents with   New Patient (Initial Visit)    RM 1 w/ husband. Paper referral for CVA. Was at [facility] back in July for 48 days post stroke. Went through rehab. Currently doing outpt PT at [facility] 2x/week. Has noticed problems w/ short term memory since stroke. Falling more. Uses walker. Has fallen 3x in the last week, no injuries. Husband has noticed delirium. Had CT/xray done last week, has not gotten results yet.     HISTORY OF PRESENT ILLNESS:  I had the pleasure of seeing your patient, Erika [name], at [facility] for neurologic consultation regarding her recent stroke.  She is a 50 year old woman who had a right pontine stroke 06/24/2021.   At the time the whole left side was paralyzed.   Speech was slurred.   911 was called and she was taken to the [facility] ED.    She spent 3 days there in the ED due to no bed and then was taken to the [facility].  Beside the weakness, she also felt very hot.     Now she is always cold.   She is doing outpatient PT at [facility] now.   She does PT/OT and ST.    She is using a hemi-walker within the house but oudoors mostly uses a wheelchair.   The left leg buckles, despite an orthotic  She is getting a new custom brace later this week.   Imaging studies 06/2021 also showed two strokes incolving gray and white matter in the right parieto-occiptal lobe and the right parietal lobe.     Of note, she started to have cognitive problems 2 years ago and she was diagnosed with pseudodementia due to depression.    I discussed that this might also have occurred due tot he parietal strokes.    Short-term memory seems  worse since his stroke.  The husband has noted some delirium at times over the past couple months.  She has pain in the right leg due to PAD (stents have re-occluded. .   Also has pain in the joints.  She sees Pain Management and is on oxycodone.  Carotid endarterectomies 05/2020 and 06/2016 were done after carotid stenosis noted screenng after a 2017 MI (has had 2).   She had CABG in 2017.   The 2021 CEA done after repeat neck CTA showed critical stenosis distal to initial CEA.     Current LVEF% (July 2022) is 60-65%.      She is currently on Plavix.  She is on Repatha for elevated cholesterol.    She has Type 2 IDDM as well.    She has depression and is on fluoxetine  I have previously seen her for a peroneal and radial neuropathies.  Both of these resolved.   Imaging: I reviewed the MRI of the brain 06/24/2021 and 06/27/2021.  They show an acute infarction on the right side of the pons.  Additionally, there is encephalomalacia/gliosis in the right parieto-occipital region and right parietal lobe consistent with remote strokes.  There is mild chronic microvascular ischemic change.  CT angiogram 06/24/2021 showed stenosis in the distal right common carotid and proximal right internal carotid artery there is high-grade stenosis of the left vertebral origin with  reconstitution distally in the neck atherosclerosis is noted in the proximal left intracranial vertebral artery.  CT scan 09/08/2021 showed T2 remote strokes in the right parietal lobe that have been seen previously as well as the stroke in the pons with expected evolution  CTscan 02/25/2019 of the brain did not show any strokes.         REVIEW OF SYSTEMS: Constitutional: No fevers, chills, sweats, or change in appetite Eyes: No visual changes, double vision, eye pain Ear, nose and throat: No hearing loss, ear pain, nasal congestion, sore throat Cardiovascular: No chest pain, palpitations Respiratory:  No shortness of breath at rest or  with exertion.   No wheezes GastrointestinaI: No nausea, vomiting, diarrhea, abdominal pain, fecal incontinence Genitourinary:  No dysuria, urinary retention or frequency.  No nocturia. Musculoskeletal:  No neck pain, back pain Integumentary: No rash, pruritus, skin lesions Neurological: as above Psychiatric: No depression at this time.  No anxiety Endocrine: No palpitations, diaphoresis, change in appetite, change in weigh or increased thirst Hematologic/Lymphatic:  No anemia, purpura, petechiae. Allergic/Immunologic: No itchy/runny eyes, nasal congestion, recent allergic reactions, rashes  ALLERGIES: Allergies  Allergen Reactions   Chantix [Varenicline Tartrate] Other (See Comments)    Feels "crazy" and angry     HOME MEDICATIONS:  Current Outpatient Medications:    acetaminophen (TYLENOL) 325 MG tablet, Take 1-2 tablets (325-650 mg total) by mouth every 4 (four) hours as needed for mild pain., Disp: , Rfl:    albuterol (PROVENTIL HFA;VENTOLIN HFA) 108 (90 Base) MCG/ACT inhaler, Inhale 2 puffs into the lungs every 6 (six) hours as needed for wheezing., Disp: 1 Inhaler, Rfl: 0   aspirin 81 MG chewable tablet, Chew 1 tablet (81 mg total) by mouth daily., Disp: , Rfl:    benztropine (COGENTIN) 0.5 MG tablet, Take 1 tablet (0.5 mg total) by mouth at bedtime., Disp: 90 tablet, Rfl: 0   carvedilol (COREG) 6.25 MG tablet, Take 1 tablet (6.25 mg total) by mouth 2 (two) times daily., Disp: 180 tablet, Rfl: 1   chlorproMAZINE (THORAZINE) 100 MG tablet, TAKE 1 TABLET BY MOUTH EVERYDAY AT BEDTIME, Disp: 30 tablet, Rfl: 2   clonazePAM (KLONOPIN) 0.5 MG tablet, Take 1 tablet (0.5 mg total) by mouth 2 (two) times daily., Disp: 60 tablet, Rfl: 2   clopidogrel (PLAVIX) 75 MG tablet, Take 1 tablet (75 mg total) by mouth daily., Disp: 30 tablet, Rfl: 2   Continuous Blood Gluc Sensor (DEXCOM G6 SENSOR) MISC, 1 Dose by Does not apply route daily., Disp: 30 each, Rfl: 3   Continuous Blood Gluc Transmit  (DEXCOM G6 TRANSMITTER) MISC, 1 Units by Does not apply route daily., Disp: 1 each, Rfl: 3   cyanocobalamin (,VITAMIN B-12,) 1000 MCG/ML injection, Inject 1 mL (1,000 mcg total) into the skin every 30 (thirty) days., Disp: 1 mL, Rfl: 0   Dulaglutide (TRULICITY) 1.44 YJ/8.5UD SOPN, Inject 0.75 mg into the skin once a week., Disp: 6 mL, Rfl: 3   Evolocumab (REPATHA SURECLICK) 149 MG/ML SOAJ, Inject 1 Dose into the skin every 14 (fourteen) days., Disp: 6 mL, Rfl: 3   ezetimibe (ZETIA) 10 MG tablet, Take 1 tablet (10 mg total) by mouth daily., Disp: 30 tablet, Rfl: 2   FLUoxetine HCl 60 MG TABS, Take 60 mg by mouth daily., Disp: 90 tablet, Rfl: 0   guaiFENesin (MUCINEX) 600 MG 12 hr tablet, Take 1 tablet (600 mg total) by mouth 2 (two) times daily., Disp: 60 tablet, Rfl: 0   icosapent Ethyl (VASCEPA) 1  g capsule, Take 2 capsules (2 g total) by mouth 2 (two) times daily., Disp: 360 capsule, Rfl: 3   Incontinence Supply Disposable (DEPEND ADJUSTABLE UNDERWEAR LG) MISC, 1 Units by Does not apply route daily., Disp: 30 each, Rfl: 3   insulin glargine-yfgn (SEMGLEE, YFGN,) 100 UNIT/ML Pen, Inject 54 Units into the skin in the morning. And pen needles 1/day, Disp: 60 mL, Rfl: 3   Insulin Syringe-Needle U-100 29G X 1/2" 0.3 ML MISC, 1 each by Does not apply route 2 (two) times daily., Disp: 30 each, Rfl: 0   isosorbide mononitrate (IMDUR) 60 MG 24 hr tablet, TAKE ONE TABLET BY MOUTH TWICE A DAY, Disp: 60 tablet, Rfl: 0   lamoTRIgine (LAMICTAL) 200 MG tablet, Take 1 tablet (200 mg total) by mouth at bedtime., Disp: 90 tablet, Rfl: 0   naloxone (NARCAN) nasal spray 4 mg/0.1 mL, Place 1 spray into the nose as needed for up to 365 doses (for opioid-induced respiratory depresssion). In case of emergency (overdose), spray once into each nostril. If no response within 3 minutes, repeat application and call [phone]., Disp: 1 each, Rfl: 0   nicotine (NICODERM CQ - DOSED IN MG/24 HOURS) 21 mg/24hr patch, Place 1 patch (21 mg  total) onto the skin daily., Disp: 30 patch, Rfl: 0   nitroGLYCERIN (NITROSTAT) 0.4 MG SL tablet, Place 1 tablet (0.4 mg total) under the tongue every 5 (five) minutes as needed for chest pain., Disp: 25 tablet, Rfl: 1   ondansetron (ZOFRAN) 4 MG tablet, Take 1 tablet (4 mg total) by mouth daily as needed for nausea or vomiting., Disp: 20 tablet, Rfl: 11   oxyCODONE (OXY IR/ROXICODONE) 5 MG immediate release tablet, Take 1 tablet (5 mg total) by mouth daily as needed for severe pain. Must last 30 days., Disp: 30 tablet, Rfl: 0   pantoprazole (PROTONIX) 40 MG tablet, Take 1 tablet (40 mg total) by mouth daily at 12 noon., Disp: 30 tablet, Rfl: 0   polyethylene glycol (MIRALAX / GLYCOLAX) 17 g packet, Take 17 g by mouth 2 (two) times daily., Disp: 60 each, Rfl: 0   potassium chloride (KLOR-CON) 20 MEQ packet, Take 20 mEq by mouth daily., Disp: 30 packet, Rfl: 0   pregabalin (LYRICA) 225 MG capsule, Take 1 capsule (225 mg total) by mouth 2 (two) times daily., Disp: 60 capsule, Rfl: 2   rosuvastatin (CRESTOR) 20 MG tablet, Take 1 tablet (20 mg total) by mouth daily., Disp: 30 tablet, Rfl: 0   senna-docusate (SENOKOT-S) 8.6-50 MG tablet, Take 2 tablets by mouth 2 (two) times daily., Disp: 120 tablet, Rfl: 0   spironolactone (ALDACTONE) 25 MG tablet, TAKE ONE TABLET BY MOUTH ONE TIME DAILY, Disp: 30 tablet, Rfl: 0   vitamin B-12 1000 MCG tablet, Take 1 tablet (1,000 mcg total) by mouth daily., Disp: , Rfl:    Vitamin D, Ergocalciferol, (DRISDOL) 1.25 MG (50000 UNIT) CAPS capsule, Take 1 capsule (50,000 Units total) by mouth every 7 (seven) days., Disp: 5 capsule, Rfl: 0   torsemide (DEMADEX) 10 MG tablet, Take 1 tablet (10 mg total) by mouth daily. Decreased from twice a day., Disp: 30 tablet, Rfl: 0  PAST MEDICAL HISTORY: Past Medical History:  Diagnosis Date   Arthralgia of temporomandibular joint    CAD, multiple vessel    a. 06/2016 Cath: ostLM 40%, ostLAD 40%, pLAD 95%, ost-pLCx 60%, pLCx 95%, mLCx  60%, mRCA 95%, D2 50%, LVSF nl;  b. 07/2016 CABG x 4 (LIMA->LAD, VG->Diag, VG->OM, VG->RCA); c. 08/2016  Cath: 3VD w/ 4/4 patent grafts. LAD distal to LIMA has diff dzs->Med rx; d. 08/2020 Cath: 4/4 patent grafts, native 3VD. EF 55-65%-->Med Rx.   Carotid arterial disease ([place])    a. 07/2016 s/p R CEA; b. 02/2021 U/S: RICA 10-25%, LICA 8-52%.   Clotting disorder ([place])    Depression    Diastolic dysfunction    a. 06/2016 Echo: EF 50-55%, mild inf wall HK, GR1DD, mild MR, RV sys fxn nl, mildly dilated LA, PASP nl   Fatty liver disease, nonalcoholic 7782   History of blood transfusion    with heart surgery   HLD (hyperlipidemia)    Labile hypertension    a. prior renal ngiogram negative for RAS in 03/2016; b. catecholamines and metanephrines normal, mildly elevated renin with normal aldosterone and normal ratio in 02/2016   Myocardial infarction [facility]) 2017   Obesity    PAD (peripheral artery disease) ([place])    a. 09/2018 s/p L SFA stenting; b. 07/2019 Periph Angio: Patent m/d L SFA stent w/ 100% L SFA distal to stent. L AT 100d, L Peroneal diff dzs-->Med Rx; c. 02/2021 ABIs: stable @ 0.61 on R and 0.46 on L.   PTSD (post-traumatic stress disorder)    Tobacco abuse    Type 2 diabetes mellitus ([place]) 12/2015    PAST SURGICAL HISTORY: Past Surgical History:  Procedure Laterality Date   ABDOMINAL AORTOGRAM W/LOWER EXTREMITY [name] 10/15/2018   Procedure: ABDOMINAL AORTOGRAM W/LOWER EXTREMITY;  Surgeon: Wellington Hampshire, MD;  Location: [place] [facility];  Service: Cardiovascular;  Laterality: [name];   ABDOMINAL AORTOGRAM W/LOWER EXTREMITY Bilateral 08/19/2019   Procedure: ABDOMINAL AORTOGRAM W/LOWER EXTREMITY;  Surgeon: Wellington Hampshire, MD;  Location: [place] [facility];  Service: Cardiovascular;  Laterality: Bilateral;   CARDIAC CATHETERIZATION [name] 06/29/2016   Procedure: Left Heart Cath and Coronary Angiography;  Surgeon: Minna Merritts, MD;  Location: [place] [facility];  Service: Cardiovascular;   Laterality: [name];   CARDIAC CATHETERIZATION [name] 08/29/2016   Procedure: Left Heart Cath and Cors/Grafts Angiography;  Surgeon: Wellington Hampshire, MD;  Location: [place] [facility];  Service: Cardiovascular;  Laterality: [name];   CESAREAN SECTION     CHOLECYSTECTOMY     CORONARY ARTERY BYPASS GRAFT [name] 07/06/2016   Procedure: CORONARY ARTERY BYPASS GRAFTING (CABG) x four, using left internal mammary artery and right leg greater saphenous vein harvested endoscopically;  Surgeon: Ivin Poot, MD;  Location: [place];  Service: Open Heart Surgery;  Laterality: [name];   ENDARTERECTOMY Right 07/06/2016   Procedure: ENDARTERECTOMY CAROTID;  Surgeon: Rosetta Posner, MD;  Location: [facility];  Service: Vascular;  Laterality: Right;   ENDARTERECTOMY Right 04/27/2020   Procedure: REDO OF RIGHT ENDARTERECTOMY CAROTID;  Surgeon: Rosetta Posner, MD;  Location: [facility] OR;  Service: Vascular;  Laterality: Right;   LEFT HEART CATH AND CORS/GRAFTS ANGIOGRAPHY [name] 08/24/2020   Procedure: LEFT HEART CATH AND CORS/GRAFTS ANGIOGRAPHY;  Surgeon: Wellington Hampshire, MD;  Location: [place] [facility];  Service: Cardiovascular;  Laterality: [name];   PERIPHERAL VASCULAR CATHETERIZATION [name] 04/18/2016   Procedure: Renal Angiography;  Surgeon: Wellington Hampshire, MD;  Location: [place] [facility];  Service: Cardiovascular;  Laterality: [name];   PERIPHERAL VASCULAR INTERVENTION Left 10/15/2018   Procedure: PERIPHERAL VASCULAR INTERVENTION;  Surgeon: Wellington Hampshire, MD;  Location: [place] [facility];  Service: Cardiovascular;  Laterality: Left;  Left superficial femoral   TEE WITHOUT CARDIOVERSION [name] 07/06/2016   Procedure: TRANSESOPHAGEAL ECHOCARDIOGRAM (TEE);  Surgeon:  Ivin Poot, MD;  Location: [place];  Service: Open Heart Surgery;  Laterality: [name];   TONSILLECTOMY      FAMILY HISTORY: Family History  Adopted: Yes  Problem Relation Age of Onset   Diabetes Mother    Diabetes Father    Alcohol abuse Father    Heart disease Father    Drug  abuse Father    Stroke Sister    Anxiety disorder Sister     SOCIAL HISTORY:  Social History   Socioeconomic History   Marital status: Married    Spouse name: Not on file   Number of children: 1   Years of education: 14   Highest education level: Not on file  Occupational History   Occupation: Disability  Tobacco Use   Smoking status: Every Day    Packs/day: 0.50    Years: 27.00    Pack years: 13.50    Types: Cigarettes   Smokeless tobacco: Never  Vaping Use   Vaping Use: Former  Substance and Sexual Activity   Alcohol use: Not Currently    Alcohol/week: 0.0 standard drinks    Comment: socially   Drug use: No   Sexual activity: Yes    Partners: Male    Birth control/protection: None  Other Topics Concern   Not on file  Social History Narrative   11/21/20   From: [name]   Living: with husband, [name] (2009) and special needs daughter    Work: disability       Family: daughter - [name] (special needs, lives with her) and 2 grown step children - [name] and [name]       Enjoys: play with dogs - 2 german Shepard, mut, and blue tick walker mix      Exercise: PAD limits exercise   Diet: diabetic diet and low potassium due to daughter      Safety   Seat belts: Yes    Guns: Yes  and secure   Safe in relationships: Yes    Social Determinants of Health   Financial Resource Strain: Not on file  Food Insecurity: Not on file  Transportation Needs: Not on file  Physical Activity: Not on file  Stress: Not on file  Social Connections: Not on file  Intimate Partner Violence: Not on file     PHYSICAL EXAM  Vitals:   09/11/21 1302  BP: 132/81  Pulse: 89  Weight: 205 lb (93 kg)  Height: 5\' 7"  (1.702 m)    Body mass index is 32.11 kg/m.   General: The patient is well-developed and well-nourished and in no acute distress  HEENT:  Head is [place]/AT.  Sclera are anicteric.  Funduscopic exam shows normal optic discs and retinal vessels.  Neck: No  carotid bruits are noted.  The neck is nontender.  Cardiovascular: The heart has a regular rate and rhythm with a normal S1 and S2. There were no murmurs, gallops or rubs.    Skin: Extremities are without rash or  edema.  Musculoskeletal:  Back is nontender  Neurologic Exam  Mental status: The patient is alert and oriented x 3 at the time of the examination. The patient has apparent normal recent and remote memory, with an apparently normal attention span and concentration ability.   Speech is normal.  Cranial nerves: Extraocular movements are full. Pupils are equal, round, and reactive to light and accomodation.  Visual fields are full.  Facial symmetry is present. There is good facial sensation to soft touch bilaterally.Facial strength is  normal.  Trapezius and sternocleidomastoid strength is normal. No dysarthria is noted.  The tongue is midline, and the patient has symmetric elevation of the soft palate. No obvious hearing deficits are noted.  Motor:  Muscle bulk is normal.   Tone is reduced in the left arm. Strength is  5 / 5 on the right but 0/5 in the distal left arm, 2 -/5 in the proximal left arm and 2/5 in the left leg except 2+/5 quads..   Sensory: Sensory testing is intact to pinprick, soft touch and vibration sensation in all 4 extremities.  Coordination: Cerebellar testing reveals good finger-nose-finger and heel-to-shin bilaterally.  Gait and station: She needs support to stand up from the chair but is able to bear her weight once up.  With support she can take some steps.  Severe left foot drop..   Reflexes: Deep tendon reflexes are symmetric and normal bilaterally.   Plantar responses are flexor.    DIAGNOSTIC DATA (LABS, IMAGING, TESTING) - I reviewed patient records, labs, notes, testing and imaging myself where available.  Lab Results  Component Value Date   WBC 8.9 07/31/2021   HGB 12.7 07/31/2021   HCT 37.4 07/31/2021   MCV 90.3 07/31/2021   PLT 325 07/31/2021       Component Value Date/Time   NA 138 07/31/2021 0536   NA 132 (L) 01/12/2021 0934   NA 135 (L) 01/11/2015 1022   K 3.8 07/31/2021 0536   K 3.9 01/11/2015 1022   CL 99 07/31/2021 0536   CL 100 01/11/2015 1022   CO2 31 07/31/2021 0536   CO2 27 01/11/2015 1022   GLUCOSE 103 (H) 07/31/2021 0536   GLUCOSE 183 (H) 01/11/2015 1022   BUN 15 07/31/2021 0536   BUN 14 01/12/2021 0934   BUN 6 (L) 01/11/2015 1022   CREATININE 1.01 (H) 07/31/2021 0536   CREATININE 0.77 01/11/2015 1022   CALCIUM 10.1 07/31/2021 0536   CALCIUM 10.0 01/11/2015 1022   PROT 6.8 07/10/2021 0325   PROT 7.8 01/07/2019 1148   PROT 7.8 01/11/2015 1022   ALBUMIN 3.1 (L) 07/10/2021 0325   ALBUMIN 4.6 01/07/2019 1148   ALBUMIN 3.4 01/11/2015 1022   AST 20 07/10/2021 0325   AST 70 (H) 01/11/2015 1022   ALT 23 07/10/2021 0325   ALT 85 (H) 01/11/2015 1022   ALKPHOS 53 07/10/2021 0325   ALKPHOS 91 01/11/2015 1022   BILITOT 0.5 07/10/2021 0325   BILITOT 0.3 01/07/2019 1148   BILITOT 0.4 01/11/2015 1022   GFRNONAA >60 07/31/2021 0536   GFRNONAA >60 01/11/2015 1022   GFRNONAA 41 (L) 08/10/2014 1459   GFRAA 106 01/12/2021 0934   GFRAA >60 01/11/2015 1022   GFRAA 48 (L) 08/10/2014 1459   Lab Results  Component Value Date   CHOL 202 (H) 06/25/2021   HDL 29 (L) 06/25/2021   LDLCALC UNABLE TO CALCULATE IF TRIGLYCERIDE OVER 400 mg/dL 06/25/2021   LDLDIRECT 99.6 (H) 06/25/2021   TRIG 636 (H) 06/25/2021   CHOLHDL 7.0 06/25/2021   Lab Results  Component Value Date   HGBA1C 7.4 (A) 08/16/2021   Lab Results  Component Value Date   VITAMINB12 169 (L) 06/28/2021   Lab Results  Component Value Date   TSH 1.950 06/28/2021       ASSESSMENT AND PLAN  History of carotid endarterectomy (Right)  Chronic pain syndrome  Cerebrovascular accident (CVA) due to occlusion of vertebral artery, unspecified blood vessel laterality (HCC) - Plan: Magnesium, Culture, Urine  Right pontine  stroke ([place])  Hemiplegia and  hemiparesis following cerebral infarction affecting left non-dominant side (HCC)  Vitamin D deficiency - Plan: VITAMIN D 25 Hydroxy (Vit-D Deficiency, Fractures)  Cognitive change - Plan: Urinalysis, Routine w reflex microscopic, Comprehensive metabolic panel, CBC with Differential/Platelet, Magnesium, Culture, Urine  Type 2 diabetes mellitus with other specified complication, with long-term current use of insulin (HCC) - Plan: Urinalysis, Routine w reflex microscopic, Comprehensive metabolic panel, CBC with Differential/Platelet, Magnesium, Culture, Urine, VITAMIN D 25 Hydroxy (Vit-D Deficiency, Fractures)  Other fatigue  - Plan: Culture, Urine   The pontine stroke was due to occlusion of the left vertebral artery.  CT angiogram during that admission did not show any other critical stenosis.  She has had right carotid endarterectomy x2.  She also has coronary artery disease and peripheral artery disease.  Continue Plavix and aspirin. Due to some fluctuating cognitive issues we will check lab work including chemistries, CBC with differential, urine culture and urinalysis and vitamin D Continue physical therapy and Occupational Therapy.  Stay active.  We did discuss prognosis.  It is probable that she will get a little bit more recovery and proximal arm strength and leg strength but will unlikely have much recovery in the hand. Return in 6 months or sooner if there are new or worsening neurologic symptoms.  45-minute office visit with the majority of the time spent face-to-face for history and physical, discussion/counseling and decision-making.  Additional time with record review and documentation.  Hisako Bugh A. Felecia Shelling, MD, [facility] 0/98/1191, 4:78 PM Certified in Neurology, Clinical Neurophysiology, Sleep Medicine and Neuroimaging  [facility] [address], [place], [place] [address] [phone]

## 2021-09-11 NOTE — Therapy (Signed)
[place] MAIN [facility] SERVICES [address] [place], [place], [address] Phone: [phone]   Fax:  [phone]  Occupational Therapy Treatment  Patient Details  Name: Erika [name] MRN: [id number] Date of Birth: June 14, 1971 Referring Provider (OT): Dr. Leeroy Cha   Encounter Date: 09/11/2021   OT End of Session - 09/11/21 1736     Visit Number 8    Number of Visits 20    Date for OT Re-Evaluation 10/25/21    Authorization Type limited to 20 visits combined/multidiscipline    Authorization Time Period Reporting period starting 08/03/2021    OT Start Time 1645    OT Stop Time 1730    OT Time Calculation (min) 45 min    Activity Tolerance Patient tolerated treatment well    Behavior During Therapy [facility] for tasks assessed/performed;Flat affect;Impulsive             Past Medical History:  Diagnosis Date   Arthralgia of temporomandibular joint    CAD, multiple vessel    a. 06/2016 Cath: ostLM 40%, ostLAD 40%, pLAD 95%, ost-pLCx 60%, pLCx 95%, mLCx 60%, mRCA 95%, D2 50%, LVSF nl;  b. 07/2016 CABG x 4 (LIMA->LAD, VG->Diag, VG->OM, VG->RCA); c. 08/2016 Cath: 3VD w/ 4/4 patent grafts. LAD distal to LIMA has diff dzs->Med rx; d. 08/2020 Cath: 4/4 patent grafts, native 3VD. EF 55-65%-->Med Rx.   Carotid arterial disease ([place])    a. 07/2016 s/p R CEA; b. 02/2021 [place]: RICA 123456, LICA 123456.   Clotting disorder ([place])    Depression    Diastolic dysfunction    a. 06/2016 Echo: EF 50-55%, mild inf wall HK, GR1DD, mild MR, RV sys fxn nl, mildly dilated LA, PASP nl   Fatty liver disease, nonalcoholic Q000111Q   History of blood transfusion    with heart surgery   HLD (hyperlipidemia)    Labile hypertension    a. prior renal ngiogram negative for RAS in 03/2016; b. catecholamines and metanephrines normal, mildly elevated renin with normal aldosterone and normal ratio in 02/2016   Myocardial infarction [facility]) 2017   Obesity    PAD (peripheral artery  disease) ([place])    a. 09/2018 s/p L SFA stenting; b. 07/2019 Periph Angio: Patent m/d L SFA stent w/ 100% L SFA distal to stent. L AT 100d, L Peroneal diff dzs-->Med Rx; c. 02/2021 ABIs: stable @ 0.61 on R and 0.46 on L.   PTSD (post-traumatic stress disorder)    Tobacco abuse    Type 2 diabetes mellitus ([place]) 12/2015    Past Surgical History:  Procedure Laterality Date   ABDOMINAL AORTOGRAM W/LOWER EXTREMITY [name] 10/15/2018   Procedure: ABDOMINAL AORTOGRAM W/LOWER EXTREMITY;  Surgeon: Wellington Hampshire, MD;  Location: [place] CV LAB;  Service: Cardiovascular;  Laterality: [name];   ABDOMINAL AORTOGRAM W/LOWER EXTREMITY Bilateral 08/19/2019   Procedure: ABDOMINAL AORTOGRAM W/LOWER EXTREMITY;  Surgeon: Wellington Hampshire, MD;  Location: [place] CV LAB;  Service: Cardiovascular;  Laterality: Bilateral;   CARDIAC CATHETERIZATION [name] 06/29/2016   Procedure: Left Heart Cath and Coronary Angiography;  Surgeon: Minna Merritts, MD;  Location: [place] CV LAB;  Service: Cardiovascular;  Laterality: [name];   CARDIAC CATHETERIZATION [name] 08/29/2016   Procedure: Left Heart Cath and Cors/Grafts Angiography;  Surgeon: Wellington Hampshire, MD;  Location: [place] CV LAB;  Service: Cardiovascular;  Laterality: [name];   CESAREAN SECTION     CHOLECYSTECTOMY     CORONARY ARTERY BYPASS GRAFT [name] 07/06/2016   Procedure: CORONARY  ARTERY BYPASS GRAFTING (CABG) x four, using left internal mammary artery and right leg greater saphenous vein harvested endoscopically;  Surgeon: Ivin Poot, MD;  Location: [place];  Service: Open Heart Surgery;  Laterality: [name];   ENDARTERECTOMY Right 07/06/2016   Procedure: ENDARTERECTOMY CAROTID;  Surgeon: Rosetta Posner, MD;  Location: [facility] OR;  Service: Vascular;  Laterality: Right;   ENDARTERECTOMY Right 04/27/2020   Procedure: REDO OF RIGHT ENDARTERECTOMY CAROTID;  Surgeon: Rosetta Posner, MD;  Location: [facility] OR;  Service: Vascular;  Laterality: Right;   LEFT HEART CATH AND CORS/GRAFTS  ANGIOGRAPHY [name] 08/24/2020   Procedure: LEFT HEART CATH AND CORS/GRAFTS ANGIOGRAPHY;  Surgeon: Wellington Hampshire, MD;  Location: [place] CV LAB;  Service: Cardiovascular;  Laterality: [name];   PERIPHERAL VASCULAR CATHETERIZATION [name] 04/18/2016   Procedure: Renal Angiography;  Surgeon: Wellington Hampshire, MD;  Location: [place] CV LAB;  Service: Cardiovascular;  Laterality: [name];   PERIPHERAL VASCULAR INTERVENTION Left 10/15/2018   Procedure: PERIPHERAL VASCULAR INTERVENTION;  Surgeon: Wellington Hampshire, MD;  Location: [place] CV LAB;  Service: Cardiovascular;  Laterality: Left;  Left superficial femoral   TEE WITHOUT CARDIOVERSION [name] 07/06/2016   Procedure: TRANSESOPHAGEAL ECHOCARDIOGRAM (TEE);  Surgeon: Ivin Poot, MD;  Location: [place];  Service: Open Heart Surgery;  Laterality: [name];   TONSILLECTOMY      There were no vitals filed for this visit.   Subjective Assessment - 09/11/21 1735     Subjective  Pt. reports having had a neurologist appointment    Patient is accompanied by: Family member    Pertinent History HX of CAD, CABG, DM2, s/p R pontine stroke    Limitations L sided hemiplegia, impulsivity, judgment, safety awareness    Currently in Pain? Yes    Pain Score 5     Pain Location Wrist    Pain Orientation Left    Pain Descriptors / Indicators Aching            OT TREATMENT     Therapeutic Exercise:   Pt. tolerated PROM in all joint ranges for left shoulder flexion, extension, abduction, horizontal abduction, and adduction, elbow flexion, extension, forearm supination, pronation, wrist extension, digit MP, PIP, and DIP flexion, and extension, Thumb abduction.   Manual Therapy:   Pt. tolerated scapular mobilizations for elevation, depression, abduction/rotation.  Pt. tolerated retrograde massage, and soft tissue mobilizations for metacarpal spread stretches to the left hand secondary to increased edema in the left hand. Manual therapy was performed independent  of, and in preparation for ther. Ex.   Pt. reports that she had an appointment with the neurologist. Pt. reports receiving news that she did not want to her about her RUE. Pt. reports 5/10 wrist pain. Pt. tolerated ROM, and manual therapy well with decreased edema noted in the left hand following intervention.  Pt. was able to tolerate ROM to the LUE. Pt. presented with decreased edema following ROM. Pt. education was provided about finding opportunities to engage the LUE as a gross assist during daily activities. Pt. continues to work on improving and maximizing independence with ADLs, and IADL tasks.                          OT Education - 09/11/21 1735     Education Details LUE proximal AROM/AAROM, supportive positioning strategies for bed    Person(s) Educated Patient;Spouse    Methods Explanation;Demonstration;Verbal cues    Comprehension Verbalized understanding;Returned demonstration;Need further instruction;Verbal  cues required              OT Short Term Goals - 08/04/21 0914       OT SHORT TERM GOAL #1   Title Pt will be perform HEP for LUE with min vc.    Baseline Eval: Initiated at eval, further instruction needed    Time 6    Period Weeks    Status New    Target Date 09/13/21               OT Long Term Goals - 08/04/21 0915       OT LONG TERM GOAL #1   Title Pt will increase FOTO score to 40 or better to indicate increased functional performance    Baseline Eval: 23    Time 12    Period Weeks    Status New    Target Date 10/25/21      OT LONG TERM GOAL #2   Title Pt will perform UB ADLs with set up    Baseline Eval: Mod A to perform UB ADLs    Time 12    Period Weeks    Status New    Target Date 10/25/21      OT LONG TERM GOAL #3   Title Pt will perform LB ADLs with min A    Baseline Eval: Max A to perform LB ADLs    Time 12    Period Weeks    Status New    Target Date 10/25/21      OT LONG TERM GOAL #4   Title Pt will  perform ADL transfers with distant supv    Baseline Eval: min A-mod A to perform ADL transfers    Time 12    Period Weeks    Status New    Target Date 10/25/21      OT LONG TERM GOAL #5   Title Pt will increase LUE strength by 2 MM grades to work towards use of LUE as a stabilizer for self care.    Baseline Eval: L shoulder 1/5, elbow/wrist/hand 0/5.  Unable to use LUE as a stabilzer for self care.    Time 12    Period Weeks    Status New    Target Date 10/25/21                   Plan - 09/11/21 1736     Clinical Impression Statement Pt. reports that she had an appointment with the neurologist. Pt. reports receiving news that she did not want to her about her RUE. Pt. reports 5/10 wrist pain. Pt. tolerated ROM, and manual therapy well with decreased edema noted in the left hand following intervention.  Pt. was able to tolerate ROM to the LUE. Pt. presented with decreased edema following ROM. Pt. education was provided about finding opportunities to engage the LUE as a gross assist during daily activities. Pt. continues to work on improving and maximizing independence with ADLs, and IADL tasks.   OT Occupational Profile and History Detailed Assessment- Review of Records and additional review of physical, cognitive, psychosocial history related to current functional performance    Occupational performance deficits (Please refer to evaluation for details): ADL's;IADL's;Leisure;Work    Marketing executive / Function / Physical Skills ADL;Continence;Dexterity;Strength;Vision;Balance;Coordination;FMC;IADL;Body mechanics;Endurance;Gait;UE functional use;Decreased knowledge of use of DME;GMC;Mobility    Rehab Potential Good    Clinical Decision Making Several treatment options, min-mod task modification necessary    Comorbidities Affecting Occupational Performance: May have  comorbidities impacting occupational performance    Modification or Assistance to Complete Evaluation  Min-Moderate  modification of tasks or assist with assess necessary to complete eval    OT Frequency 2x / week    OT Duration 12 weeks    OT Treatment/Interventions Self-care/ADL training;Therapeutic exercise;DME and/or AE instruction;Functional Mobility Training;Balance training;Electrical Stimulation;Neuromuscular education;Manual Therapy;Splinting;Visual/perceptual remediation/compensation;Moist Heat;Passive range of motion;Therapeutic activities;Patient/family education    Consulted and Agree with Plan of Care Patient;Family member/caregiver             Patient will benefit from skilled therapeutic intervention in order to improve the following deficits and impairments:   Body Structure / Function / Physical Skills: ADL, Continence, Dexterity, Strength, Vision, Balance, Coordination, [place], IADL, Body mechanics, Endurance, Gait, UE functional use, Decreased knowledge of use of DME, [place], Mobility       Visit Diagnosis: Muscle weakness (generalized)    Problem List Patient Active Problem List   Diagnosis Date Noted   Cerebrovascular accident (CVA) due to occlusion of vertebral artery ([place]) 09/11/2021   Cognitive change 09/11/2021   Cognitive communication deficit 08/10/2021   Urinary incontinence 08/10/2021   GERD (gastroesophageal reflux disease) 08/03/2021   Chronic post-traumatic stress disorder (PTSD)    Depression with anxiety    Right pontine stroke ([place]) 06/28/2021   Hemiplegia and hemiparesis following cerebral infarction affecting left non-dominant side ([place]) 06/24/2021   Chronic diastolic CHF (congestive heart failure) ([place]) 06/24/2021   Fall 06/24/2021   Abnormal LFTs 06/24/2021   Olecranon bursitis of left elbow 06/12/2021   Chronic hip pain (Bilateral) 06/09/2021   Chronic use of opiate for therapeutic purpose 05/09/2021   Greater trochanteric bursitis of hip (Left) 03/09/2021   Bursitis of hip (Left) Q000111Q   Uncomplicated opioid dependence ([place]) 02/20/2021   Acute  conjunctivitis of left eye 01/02/2021   Unintentional weight loss 11/21/2020   Broken teeth (Right) 08/02/2020   History of MI (myocardial infarction) (July 2017) 08/02/2020   Neuropathy 06/09/2020   Dental abscess 06/09/2020   Foot drop (Left) 06/09/2020   Carotid stenosis, asymptomatic, right 04/27/2020   Chronic migraine 11/03/2019   Thrombocytosis 09/16/2019   Erythrocytosis 09/16/2019   Hypercalcemia 09/06/2019   Leukocytosis 09/06/2019   Polycythemia 09/06/2019   Chronic hip pain (Left) 08/27/2019   Osteoarthritis of hip (Left) 08/27/2019   Gluteal tendonitis of buttock (Left) 08/27/2019   Radial nerve palsy (Right) 07/15/2019   Neuropathy of radial nerve (Right) 06/30/2019   Abnormal bruising 06/25/2019   History of carotid endarterectomy (Right) 03/04/2019   Pain medication agreement signed 03/04/2019   Atypical facial pain (Right) 01/27/2019   Chronic ear pain (Right) 01/27/2019   Chronic jaw pain (Right) 01/27/2019   Geniculate Neuralgia (Right) 01/27/2019   Vitamin D deficiency 01/19/2019   Neurogenic pain 01/19/2019   Chronic anticoagulation (PLAVIX) 01/19/2019   Chronic pain syndrome 01/07/2019   Long term current use of opiate analgesic 01/07/2019   Long term prescription benzodiazepine use 01/07/2019   Pharmacologic therapy 01/07/2019   Disorder of skeletal system 01/07/2019   Problems influencing health status 01/07/2019   Chronic headaches (1ry area of Pain) (Right) 01/07/2019   Opiate use 09/24/2018   PVD (peripheral vascular disease) ([place]) 06/24/2018   Chronic ankle pain (Bilateral) 12/27/2017   Tendinopathy of gluteus medius (Right) 12/27/2017   Tendinopathy of gluteus medius (Left) 12/27/2017   Bilateral hip pain 12/26/2017   Chronic elbow pain (Left) 10/17/2017   Elevated troponin I level 05/21/2017   Major depressive disorder, recurrent episode, moderate ([place]) 11/19/2016  Insomnia 10/30/2016   Constipation 07/25/2016   S/P CABG x 4 07/06/2016    Bradycardia    CAD (coronary artery disease)    Carotid stenosis    CAD in native artery 06/29/2016   Elevated troponin 06/28/2016   Essential hypertension, malignant 06/28/2016   Mild tobacco abuse in early remission 06/28/2016   Essential hypertension    Malignant hypertension    Type 2 diabetes mellitus with other specified complication (HCC)    Chest pain with high risk for cardiac etiology 06/27/2016   NSTEMI (non-ST elevated myocardial infarction) ([place]) 06/27/2016   Proteinuria 03/15/2016   Renal artery stenosis ([place]) 03/15/2016   MDD (major depressive disorder) 10/17/2015   Agoraphobia with panic attacks 04/25/2015   HTN (hypertension), malignant 10/20/2013   Cluster headache 03/20/2012   HLD (hyperlipidemia) 07/26/2010   ADJUSTMENT DISORDER WITH MIXED FEATURES 07/26/2010    Harrel Carina, MS, OTR/L 09/11/2021, 5:37 PM  [place] MAIN [facility] SERVICES [place], [place], [address] Phone: [phone]   Fax:  [phone]  Name: JOMAIRA DEMIAN MRN: [id number] Date of Birth: [birth year]

## 2021-09-12 ENCOUNTER — Encounter: Payer: Self-pay | Admitting: Physical Medicine and Rehabilitation

## 2021-09-12 ENCOUNTER — Encounter (HOSPITAL_BASED_OUTPATIENT_CLINIC_OR_DEPARTMENT_OTHER): Payer: Managed Care, Other (non HMO) | Admitting: Physical Medicine and Rehabilitation

## 2021-09-12 DIAGNOSIS — I639 Cerebral infarction, unspecified: Secondary | ICD-10-CM

## 2021-09-12 DIAGNOSIS — M6281 Muscle weakness (generalized): Secondary | ICD-10-CM

## 2021-09-12 DIAGNOSIS — M6289 Other specified disorders of muscle: Secondary | ICD-10-CM

## 2021-09-12 DIAGNOSIS — Z79891 Long term (current) use of opiate analgesic: Secondary | ICD-10-CM

## 2021-09-12 DIAGNOSIS — G43909 Migraine, unspecified, not intractable, without status migrainosus: Secondary | ICD-10-CM

## 2021-09-12 LAB — COMPREHENSIVE METABOLIC PANEL
ALT: 22 IU/L (ref 0–32)
AST: 22 IU/L (ref 0–40)
Albumin/Globulin Ratio: 1.6 (ref 1.2–2.2)
Albumin: 4.5 g/dL (ref 3.8–4.8)
Alkaline Phosphatase: 73 IU/L (ref 44–121)
BUN/Creatinine Ratio: 11 (ref 9–23)
BUN: 10 mg/dL (ref 6–24)
Bilirubin Total: 0.2 mg/dL (ref 0.0–1.2)
CO2: 28 mmol/L (ref 20–29)
Calcium: 10.4 mg/dL — ABNORMAL HIGH (ref 8.7–10.2)
Chloride: 96 mmol/L (ref 96–106)
Creatinine, Ser: 0.87 mg/dL (ref 0.57–1.00)
Globulin, Total: 2.9 g/dL (ref 1.5–4.5)
Glucose: 134 mg/dL — ABNORMAL HIGH (ref 65–99)
Potassium: 4.3 mmol/L (ref 3.5–5.2)
Sodium: 138 mmol/L (ref 134–144)
Total Protein: 7.4 g/dL (ref 6.0–8.5)
eGFR: 83 mL/min/{1.73_m2} (ref >59)

## 2021-09-12 LAB — CBC WITH DIFFERENTIAL/PLATELET
Basophils Absolute: 0 10*3/uL (ref 0.0–0.2)
Basos: 0 %
EOS (ABSOLUTE): 0.2 10*3/uL (ref 0.0–0.4)
Eos: 2 %
Hematocrit: 43.5 % (ref 34.0–46.6)
Hemoglobin: 14.2 g/dL (ref 11.1–15.9)
Immature Grans (Abs): 0 10*3/uL (ref 0.0–0.1)
Immature Granulocytes: 0 %
Lymphocytes Absolute: 2.6 10*3/uL (ref 0.7–3.1)
Lymphs: 27 %
MCH: 29 pg (ref 26.6–33.0)
MCHC: 32.6 g/dL (ref 31.5–35.7)
MCV: 89 fL (ref 79–97)
Monocytes Absolute: 0.4 10*3/uL (ref 0.1–0.9)
Monocytes: 4 %
Neutrophils Absolute: 6.6 10*3/uL (ref 1.4–7.0)
Neutrophils: 67 %
Platelets: 433 10*3/uL (ref 150–450)
RBC: 4.89 x10E6/uL (ref 3.77–5.28)
RDW: 12.8 % (ref 11.7–15.4)
WBC: 9.8 10*3/uL (ref 3.4–10.8)

## 2021-09-12 LAB — URINALYSIS, ROUTINE W REFLEX MICROSCOPIC
Bilirubin, UA: NEGATIVE
Glucose, UA: NEGATIVE
Ketones, UA: NEGATIVE
Nitrite, UA: NEGATIVE
Protein,UA: NEGATIVE
RBC, UA: NEGATIVE
Specific Gravity, UA: 1.011 (ref 1.005–1.030)
Urobilinogen, Ur: 0.2 mg/dL (ref 0.2–1.0)
pH, UA: 5.5 (ref 5.0–7.5)

## 2021-09-12 LAB — VITAMIN D 25 HYDROXY (VIT D DEFICIENCY, FRACTURES): Vit D, 25-Hydroxy: 34.2 ng/mL (ref 30.0–100.0)

## 2021-09-12 LAB — MICROSCOPIC EXAMINATION
Bacteria, UA: NONE SEEN
Casts: NONE SEEN /lpf
RBC, Urine: NONE SEEN /hpf (ref 0–2)
WBC, UA: >30 /hpf — AB (ref 0–5)

## 2021-09-12 LAB — MAGNESIUM: Magnesium: 2 mg/dL (ref 1.6–2.3)

## 2021-09-12 MED ORDER — BUTALBITAL-APAP-CAFFEINE 50-325-40 MG PO TABS: 1.0000 | ORAL_TABLET | Freq: Every day | ORAL | 0 refills | Status: DC | PRN

## 2021-09-12 NOTE — Progress Notes (Signed)
Subjective:    Patient ID: Erika [name], female    DOB: [birth year], 50 y.o.   MRN: [id number]  HPI An audio/video tele-health visit is felt to be the most appropriate encounter for this patient at this time. This is a follow up tele-visit via MyChart. The patient is at home. MD is at office.    Erika [name] is a 50 year old woman who presents for f/u of CVA and headaches.   1) CVA -She has been doing well at home, but at times feels she is a burden to her husband and family despite his reassurance that she is not  2) Type 2 DM: -previously sent scripts for for [facility] receiver, sensory, and transmitter- her daughter has this so she is familiar with its use -Her CBGs have recently been between 120 and 130  3) Migraines -migraines continue to be severe, present every day, despite use of oxycodone and Lyrica. -she has never tried Fioricet. -she is willing to stop oxycodone- has enough to use until 9/23.  -having about one episode of urinary incontinence daily, usually at night  4) Quadriceps weakness -she had a fall last week when using the bedside commode- her legs buckled. She has had no more falls since then   Pain Inventory Average Pain 7 Pain Right Now 7 My pain is constant and dull  LOCATION OF PAIN  head  BOWEL Number of stools per week: 3 Oral laxative use Yes  Type of laxative miralax   BLADDER  Bladder incontinence Yes    Mobility ability to climb steps?  no do you drive?  no use a wheelchair needs help with transfers Do you have any goals in this area?  yes  Function disabled: date disabled . I need assistance with the following:  dressing, bathing, toileting, meal prep, household duties, and shopping  Neuro/Psych bladder control problems bowel control problems trouble walking spasms confusion depression anxiety  Prior Studies Any changes since last visit?  no  Physicians involved in your care Any changes since last visit?   no Primary care sees primary tomorrow Neurologist has not heard from [place] yet   Family History  Adopted: Yes  Problem Relation Age of Onset   Diabetes Mother    Diabetes Father    Alcohol abuse Father    Heart disease Father    Drug abuse Father    Stroke Sister    Anxiety disorder Sister    Social History   Socioeconomic History   Marital status: Married    Spouse name: Not on file   Number of children: 1   Years of education: 14   Highest education level: Not on file  Occupational History   Occupation: Disability  Tobacco Use   Smoking status: Every Day    Packs/day: 0.50    Years: 27.00    Pack years: 13.50    Types: Cigarettes   Smokeless tobacco: Never  Vaping Use   Vaping Use: Former  Substance and Sexual Activity   Alcohol use: Not Currently    Alcohol/week: 0.0 standard drinks    Comment: socially   Drug use: No   Sexual activity: Yes    Partners: Male    Birth control/protection: None  Other Topics Concern   Not on file  Social History Narrative   11/21/20   From: [name]   Living: with husband, [name] (2009) and special needs daughter    Work: disability       Family: daughter -  [name] (special needs, lives with her) and 2 grown step children - [name] and [name]       Enjoys: play with dogs - 2 german Shepard, mut, and blue tick walker mix      Exercise: PAD limits exercise   Diet: diabetic diet and low potassium due to daughter      Safety   Seat belts: Yes    Guns: Yes  and secure   Safe in relationships: Yes    Social Determinants of Health   Financial Resource Strain: Not on file  Food Insecurity: Not on file  Transportation Needs: Not on file  Physical Activity: Not on file  Stress: Not on file  Social Connections: Not on file   Past Surgical History:  Procedure Laterality Date   ABDOMINAL AORTOGRAM W/LOWER EXTREMITY [name] 10/15/2018   Procedure: ABDOMINAL AORTOGRAM W/LOWER EXTREMITY;  Surgeon: Wellington Hampshire, MD;   Location: [facility];  Service: Cardiovascular;  Laterality: [name];   ABDOMINAL AORTOGRAM W/LOWER EXTREMITY Bilateral 08/19/2019   Procedure: ABDOMINAL AORTOGRAM W/LOWER EXTREMITY;  Surgeon: Wellington Hampshire, MD;  Location: [facility];  Service: Cardiovascular;  Laterality: Bilateral;   CARDIAC CATHETERIZATION [name] 06/29/2016   Procedure: Left Heart Cath and Coronary Angiography;  Surgeon: Minna Merritts, MD;  Location: [facility];  Service: Cardiovascular;  Laterality: [name];   CARDIAC CATHETERIZATION [name] 08/29/2016   Procedure: Left Heart Cath and Cors/Grafts Angiography;  Surgeon: Wellington Hampshire, MD;  Location: [facility];  Service: Cardiovascular;  Laterality: [name];   CESAREAN SECTION     CHOLECYSTECTOMY     CORONARY ARTERY BYPASS GRAFT [name] 07/06/2016   Procedure: CORONARY ARTERY BYPASS GRAFTING (CABG) x four, using left internal mammary artery and right leg greater saphenous vein harvested endoscopically;  Surgeon: Ivin Poot, MD;  Location: [facility];  Service: Open Heart Surgery;  Laterality: [name];   ENDARTERECTOMY Right 07/06/2016   Procedure: ENDARTERECTOMY CAROTID;  Surgeon: Rosetta Posner, MD;  Location: [facility] OR;  Service: Vascular;  Laterality: Right;   ENDARTERECTOMY Right 04/27/2020   Procedure: REDO OF RIGHT ENDARTERECTOMY CAROTID;  Surgeon: Rosetta Posner, MD;  Location: [facility] OR;  Service: Vascular;  Laterality: Right;   LEFT HEART CATH AND CORS/GRAFTS ANGIOGRAPHY [name] 08/24/2020   Procedure: LEFT HEART CATH AND CORS/GRAFTS ANGIOGRAPHY;  Surgeon: Wellington Hampshire, MD;  Location: [facility];  Service: Cardiovascular;  Laterality: [name];   PERIPHERAL VASCULAR CATHETERIZATION [name] 04/18/2016   Procedure: Renal Angiography;  Surgeon: Wellington Hampshire, MD;  Location: [facility];  Service: Cardiovascular;  Laterality: [name];   PERIPHERAL VASCULAR INTERVENTION Left 10/15/2018   Procedure: PERIPHERAL VASCULAR INTERVENTION;  Surgeon: Wellington Hampshire, MD;   Location: [facility];  Service: Cardiovascular;  Laterality: Left;  Left superficial femoral   TEE WITHOUT CARDIOVERSION [name] 07/06/2016   Procedure: TRANSESOPHAGEAL ECHOCARDIOGRAM (TEE);  Surgeon: Ivin Poot, MD;  Location: [facility];  Service: Open Heart Surgery;  Laterality: [name];   TONSILLECTOMY     Past Medical History:  Diagnosis Date   Arthralgia of temporomandibular joint    CAD, multiple vessel    a. 06/2016 Cath: ostLM 40%, ostLAD 40%, pLAD 95%, ost-pLCx 60%, pLCx 95%, mLCx 60%, mRCA 95%, D2 50%, LVSF nl;  b. 07/2016 CABG x 4 (LIMA->LAD, VG->Diag, VG->OM, VG->RCA); c. 08/2016 Cath: 3VD w/ 4/4 patent grafts. LAD distal to LIMA has diff dzs->Med rx; d. 08/2020 Cath: 4/4 patent grafts, native 3VD. EF 55-65%-->Med  Rx.   Carotid arterial disease ([place])    a. 07/2016 s/p R CEA; b. 02/2021 U/S: RICA 26-71%, LICA 2-45%.   Clotting disorder ([place])    Depression    Diastolic dysfunction    a. 06/2016 Echo: EF 50-55%, mild inf wall HK, GR1DD, mild MR, RV sys fxn nl, mildly dilated LA, PASP nl   Fatty liver disease, nonalcoholic 8099   History of blood transfusion    with heart surgery   HLD (hyperlipidemia)    Labile hypertension    a. prior renal ngiogram negative for RAS in 03/2016; b. catecholamines and metanephrines normal, mildly elevated renin with normal aldosterone and normal ratio in 02/2016   Myocardial infarction [facility]) 2017   Obesity    PAD (peripheral artery disease) ([place])    a. 09/2018 s/p L SFA stenting; b. 07/2019 Periph Angio: Patent m/d L SFA stent w/ 100% L SFA distal to stent. L AT 100d, L Peroneal diff dzs-->Med Rx; c. 02/2021 ABIs: stable @ 0.61 on R and 0.46 on L.   PTSD (post-traumatic stress disorder)    Tobacco abuse    Type 2 diabetes mellitus ([place]) 12/2015   There were no vitals taken for this visit.  Opioid Risk Score:   Fall Risk Score:  `1  Depression screen PHQ 2/9  Depression screen [facility] 2/9 09/06/2021 08/09/2021 05/10/2021 02/23/2021 02/20/2021 11/23/2020  11/21/2020  Decreased Interest 3 2 0 0 0 0 2  Down, Depressed, Hopeless 3 3 0 0 0 0 3  PHQ - 2 Score 6 5 0 0 0 0 5  Altered sleeping - 0 - - - - 3  Tired, decreased energy - 3 - - - - 2  Change in appetite - 2 - - - - 2  Feeling bad or failure about yourself  - 3 - - - - 2  Trouble concentrating - 3 - - - - 2  Moving slowly or fidgety/restless - 2 - - - - 1  Suicidal thoughts - 0 - - - - 1  PHQ-9 Score - 18 - - - - 18  Difficult doing work/chores - Very difficult - - - - Very difficult  Some recent data might be hidden     Review of Systems  Constitutional: Negative.   HENT: Negative.    Eyes: Negative.   Respiratory: Negative.    Cardiovascular: Negative.   Gastrointestinal:        Bowel control  Endocrine: Negative.   Genitourinary:        Bladder control  Musculoskeletal:  Positive for gait problem.  Skin: Negative.   Allergic/Immunologic: Negative.   Neurological:  Positive for weakness.  Hematological:  Bruises/bleeds easily.       Plavix  Psychiatric/Behavioral:  Positive for confusion and dysphoric mood. The patient is nervous/anxious.   All other systems reviewed and are negative.     Objective:   Physical Exam Patient appears to be comfortable, alert and oriented, able to answer questions well.       Assessment & Plan:  Erika [name] is a very pleasant 50 year old woman who presents for f/u of fall post-CVA, and chronic headaches.   1) CVA: Encouraged increasing walking distance every day -referred to neurology for follow-up, discussed that she was prescribed Vascepa yesterday by Dr. Felecia Shelling.  -continue home therapies  2) Migraines -failed multiple medications -present every day -would like to try Botox 200U next visit -UDS and pain contract signed -continue lyrica 225mg  BID, discussed we will try  to wean next visit.  -made goal to replace soft drinks with electrolyte water -discontinue oxycodone. Replace with Fioricet once per day.   3)  HTN: -Advised checking BP daily at home and logging results to bring into follow-up appointment with her PCP and myself. -Reviewed BP meds today.  -Advised regarding healthy foods that can help lower blood pressure and provided with a list: 1) citrus foods- high in vitamins and minerals 2) salmon and other fatty fish - reduces inflammation and oxylipins 3) swiss chard (leafy green)- high level of nitrates 4) pumpkin seeds- one of the best natural sources of magnesium 5) Beans and lentils- high in fiber, magnesium, and potassium 6) Berries- high in flavonoids 7) Amaranth (whole grain, can be cooked similarly to rice and oats)- high in magnesium and fiber 8) Pistachios- even more effective at reducing BP than other nuts 9) Carrots- high in phenolic compounds that relax blood vessels and reduce inflammation 10) Celery- contain phthalides that relax tissues of arterial walls 11) Tomatoes- can also improve cholesterol and reduce risk of heart disease 12) Broccoli- good source of magnesium, calcium, and potassium 13) Greek yogurt: high in potassium and calcium 14) Herbs and spices: Celery seed, cilantro, saffron, lemongrass, black cumin, ginseng, cinnamon, cardamom, sweet basil, and ginger 15) Chia and flax seeds- also help to lower cholesterol and blood sugar 16) Beets- high levels of nitrates that relax blood vessels  17) spinach and bananas- high in potassium  4) Diabetes: -check CBGs daily, log, and bring log to follow-up appointment -try to incorporate into your diet some of the following foods which are good for diabetes: 1) cinnamon- imitates effects of insulin, increasing glucose transport into cells ([place] or [place] cinnamon is best, least processed) 2) nuts- can slow down the blood sugar response of carbohydrate rich foods 3) oatmeal- contains and anti-inflammatory compound avenanthramide 4) whole-milk yogurt (best types are no sugar, [place] yogurt, or goat/sheep yogurt) 5)  beans- high in protein, fiber, and vitamins, low glycemic index 6) broccoli- great source of vitamin A and C 7) quinoa- higher in protein and fiber than other grains 8) spinach- high in vitamin A, fiber, and protein 9) olive oil- reduces glucose levels, LDL, and triglycerides 10) salmon- excellent amount of omega-3-fatty acids 11) walnuts- rich in antioxidants 12) apples- high in fiber and quercetin 13) carrots- highly nutritious with low impact on blood sugar 14) eggs- improve HDL (good cholesterol), high in protein, keep you satiated 15) turmeric: improves blood sugars, cardiovascular disease, and protects kidney health 16) garlic: improves blood sugar, blood pressure, pain 17) tomatoes: highly nutritious with low impact on blood sugar   5) left foot drop: continue AFO ordered from hangar  6) Left sided post-stroke shoulder pain --Discussed [employer] PNS system as an option of pain treatment via neuromodulation. Provided following link for patient to learn more about the system: https://www.sprtherapeutics.com/. Explained mechanism of activating A beta fibers which function in touch, pressure, and vibration, to inhibit the sensations of A delta and C fibers which are responsible for pain transmission.   -continue therapy -E-stim ordered due to flaccidity, atrophy, pain- discussed where e-stim should be placed  7) Quadriceps weakness contributing to fall- recommended seated quadriceps strengthening exercises daily. Continue PT and OT  8) Insomnia: resolved, discontinue trazodone.  9) Multiple falls: CT Head ordered to assess for new stroke, CXR ordered to assess for new pneumonia: discussed with patient that both are negative. Likely cause of falls is quadriceps weakness as in #7.

## 2021-09-13 ENCOUNTER — Other Ambulatory Visit: Payer: Self-pay

## 2021-09-13 ENCOUNTER — Ambulatory Visit: Payer: Managed Care, Other (non HMO) | Admitting: Occupational Therapy

## 2021-09-13 ENCOUNTER — Ambulatory Visit: Payer: Managed Care, Other (non HMO) | Admitting: Speech Pathology

## 2021-09-13 ENCOUNTER — Ambulatory Visit: Payer: Managed Care, Other (non HMO)

## 2021-09-13 DIAGNOSIS — M6281 Muscle weakness (generalized): Secondary | ICD-10-CM

## 2021-09-13 DIAGNOSIS — R41841 Cognitive communication deficit: Secondary | ICD-10-CM

## 2021-09-13 DIAGNOSIS — R278 Other lack of coordination: Secondary | ICD-10-CM

## 2021-09-13 DIAGNOSIS — R471 Dysarthria and anarthria: Secondary | ICD-10-CM

## 2021-09-13 DIAGNOSIS — R2689 Other abnormalities of gait and mobility: Secondary | ICD-10-CM

## 2021-09-13 DIAGNOSIS — R2681 Unsteadiness on feet: Secondary | ICD-10-CM

## 2021-09-13 NOTE — Therapy (Signed)
[place] MAIN [facility] SERVICES [address] [place], [place], [address] Phone: [phone]   Fax:  [phone]  Physical Therapy Treatment  Patient Details  Name: Erika [name] MRN: [id number] Date of Birth: 1971/01/24 Referring Provider (PT): Leeroy Cha MD   Encounter Date: 09/13/2021   PT End of Session - 09/14/21 1523     Visit Number 9    Number of Visits 20    Date for PT Re-Evaluation 09/14/21    Authorization Type [employer] max visits 20x per year speech, OT, PT    Authorization Time Period 08/03/21-10/26/21    Progress Note Due on Visit 10    PT Start Time 1519    PT Stop Time 1600    PT Time Calculation (min) 41 min    Equipment Utilized During Treatment Gait belt;Other (comment)   L AFO   Activity Tolerance Patient tolerated treatment well    Behavior During Therapy [facility] for tasks assessed/performed;Flat affect;Impulsive             Past Medical History:  Diagnosis Date   Arthralgia of temporomandibular joint    CAD, multiple vessel    a. 06/2016 Cath: ostLM 40%, ostLAD 40%, pLAD 95%, ost-pLCx 60%, pLCx 95%, mLCx 60%, mRCA 95%, D2 50%, LVSF nl;  b. 07/2016 CABG x 4 (LIMA->LAD, VG->Diag, VG->OM, VG->RCA); c. 08/2016 Cath: 3VD w/ 4/4 patent grafts. LAD distal to LIMA has diff dzs->Med rx; d. 08/2020 Cath: 4/4 patent grafts, native 3VD. EF 55-65%-->Med Rx.   Carotid arterial disease ([place])    a. 07/2016 s/p R CEA; b. 02/2021 U/S: RICA 62-69%, LICA 4-85%.   Clotting disorder ([place])    Depression    Diastolic dysfunction    a. 06/2016 Echo: EF 50-55%, mild inf wall HK, GR1DD, mild MR, RV sys fxn nl, mildly dilated LA, PASP nl   Fatty liver disease, nonalcoholic 4627   History of blood transfusion    with heart surgery   HLD (hyperlipidemia)    Labile hypertension    a. prior renal ngiogram negative for RAS in 03/2016; b. catecholamines and metanephrines normal, mildly elevated renin with normal aldosterone and normal ratio in  02/2016   Myocardial infarction [facility]) 2017   Obesity    PAD (peripheral artery disease) ([place])    a. 09/2018 s/p L SFA stenting; b. 07/2019 Periph Angio: Patent m/d L SFA stent w/ 100% L SFA distal to stent. L AT 100d, L Peroneal diff dzs-->Med Rx; c. 02/2021 ABIs: stable @ 0.61 on R and 0.46 on L.   PTSD (post-traumatic stress disorder)    Tobacco abuse    Type 2 diabetes mellitus ([place]) 12/2015    Past Surgical History:  Procedure Laterality Date   ABDOMINAL AORTOGRAM W/LOWER EXTREMITY [name] 10/15/2018   Procedure: ABDOMINAL AORTOGRAM W/LOWER EXTREMITY;  Surgeon: Wellington Hampshire, MD;  Location: [place] CV LAB;  Service: Cardiovascular;  Laterality: [name];   ABDOMINAL AORTOGRAM W/LOWER EXTREMITY Bilateral 08/19/2019   Procedure: ABDOMINAL AORTOGRAM W/LOWER EXTREMITY;  Surgeon: Wellington Hampshire, MD;  Location: [place] CV LAB;  Service: Cardiovascular;  Laterality: Bilateral;   CARDIAC CATHETERIZATION [name] 06/29/2016   Procedure: Left Heart Cath and Coronary Angiography;  Surgeon: Minna Merritts, MD;  Location: [place] CV LAB;  Service: Cardiovascular;  Laterality: [name];   CARDIAC CATHETERIZATION [name] 08/29/2016   Procedure: Left Heart Cath and Cors/Grafts Angiography;  Surgeon: Wellington Hampshire, MD;  Location: [place] CV LAB;  Service: Cardiovascular;  Laterality: [name];  CESAREAN SECTION     CHOLECYSTECTOMY     CORONARY ARTERY BYPASS GRAFT [name] 07/06/2016   Procedure: CORONARY ARTERY BYPASS GRAFTING (CABG) x four, using left internal mammary artery and right leg greater saphenous vein harvested endoscopically;  Surgeon: Ivin Poot, MD;  Location: [place];  Service: Open Heart Surgery;  Laterality: [name];   ENDARTERECTOMY Right 07/06/2016   Procedure: ENDARTERECTOMY CAROTID;  Surgeon: Rosetta Posner, MD;  Location: [facility] OR;  Service: Vascular;  Laterality: Right;   ENDARTERECTOMY Right 04/27/2020   Procedure: REDO OF RIGHT ENDARTERECTOMY CAROTID;  Surgeon: Rosetta Posner, MD;  Location: [facility] OR;   Service: Vascular;  Laterality: Right;   LEFT HEART CATH AND CORS/GRAFTS ANGIOGRAPHY [name] 08/24/2020   Procedure: LEFT HEART CATH AND CORS/GRAFTS ANGIOGRAPHY;  Surgeon: Wellington Hampshire, MD;  Location: [place] CV LAB;  Service: Cardiovascular;  Laterality: [name];   PERIPHERAL VASCULAR CATHETERIZATION [name] 04/18/2016   Procedure: Renal Angiography;  Surgeon: Wellington Hampshire, MD;  Location: [place] CV LAB;  Service: Cardiovascular;  Laterality: [name];   PERIPHERAL VASCULAR INTERVENTION Left 10/15/2018   Procedure: PERIPHERAL VASCULAR INTERVENTION;  Surgeon: Wellington Hampshire, MD;  Location: [place] CV LAB;  Service: Cardiovascular;  Laterality: Left;  Left superficial femoral   TEE WITHOUT CARDIOVERSION [name] 07/06/2016   Procedure: TRANSESOPHAGEAL ECHOCARDIOGRAM (TEE);  Surgeon: Ivin Poot, MD;  Location: [place];  Service: Open Heart Surgery;  Laterality: [name];   TONSILLECTOMY      There were no vitals filed for this visit.   Subjective Assessment - 09/13/21 1523     Subjective Pt reports 8/10 headache currently. Pt reports two falls this morning that occurred when she slid out of her bed. Pt reports she did not get injured and that her husband helped her up with both falls.    Pertinent History Pt presented to [facility]/24/21 c c/c of slurred speech, L facial droop & LUE weakness. MRI revealed acute R pontine perforator infarct with chronic R parietal & occipital infarcts. PMH: HTN, HLD, DM, tobacco abuse, PAD, CAD, CABG, dCHF, peptic foot drop, renal artery stenosis, MI and CAD (s/p of R CEA 2017), PTSD, adjustment disorder, obesity, Left hip OA s/p OPPT 2020. Pt DC 06/28/21 to inpatient rehab in [place], [place] to home 08/01/21. AT CIR pt worked extensively on STS with MinA or Supervision and HW, Stand and squat pivot transfers with MinA, AMB progressed to ~70ft max, mostly with HW (some RW use with fixed LUE on handle), but was mostly limited in further progression of distance 2/2 chronic PAD  ischaemic pain or Left knee buckling despite AFO use. CIR recommending update to more supporting AFO at transfer to OPPT. Since home, pt is using WC to navigate the environment, family assists with ADL performance, WC still needs to be modified to fit through BR doorframe. Pt reports not feeling safe with HW at present. Husband has been helping with gnelte stretching in mornings as pt sleeps in a flexion synergy posture on LLE.    Limitations Walking;Standing    How long can you sit comfortably? Generally well tolerated with occasional buttocks soreness    How long can you stand comfortably? At eval: tolerates <2 minutes prior to ischemic leg pain    How long can you walk comfortably? Longest distance at CIR last month ~52ft.    Patient Stated Goals Return indpendent ambulation    Currently in Pain? Yes    Pain Score 8     Pain Location  Head    Pain Onset More than a month ago            Interventions-  Seated: Hamstring isometrics: 2 x 10 BLES with 3-second holds; rates easy  -Continued cues for proper muscle activation -Patient pressing heel into DynaDisc   Patient transfers from wheelchair to mat table performing sit to stand from wheelchair to hemiwalker and then sidestepping and sitting down on mat table.  Contact-guard assist provided throughout  Increased height of mat table so patient can perform the following with less resistance: -Left lower extremity towel slide to promote hamstring activation x several repetitions.  PT provides hands-on assistance for a few repetitions increase range of motion.  Patient progresses to RTB hamstring curl on left lower extremity 10x  Soccer ball LLE quad kicks with patient kicking ball into cone to knock it over - x multiple repetitions, patient exhibits good coordination and knocks cone over with majority of repetitions.  Patient performs 10 sit to stands from mat table to hemiwalker   With close contact-guard assist patient sidesteps  around perimeter of mat table to other side (approx 18 ft)  Patient then performs 10 more sit to stands with close contact-guard assist  Patient performs sit to stand to hemiwalker and then with close contact-guard assist ambulate 60 feet from mat table to wheelchair where PT assist patient back into wheelchair.  Pt educated throughout session about proper posture and technique with exercises. Improved exercise technique, movement at target joints, use of target muscles after min to mod verbal, visual, tactile cues.  Note: Portions of this document were prepared using Dragon voice recognition software and although reviewed may contain unintentional dictation errors in syntax, grammar, or spelling.      PT Education - 09/14/21 1523     Education Details Exercise technique, body mechanics    Person(s) Educated Patient    Methods Explanation;Demonstration;Tactile cues;Verbal cues    Comprehension Verbalized understanding;Returned demonstration;Need further instruction              PT Short Term Goals - 08/03/21 1645       PT SHORT TERM GOAL #1   Title Pt to demonstrate improved tolerance to standing >3 minutes without physical assist or LOB. DME ad lib    Baseline <2 minutes at eval    Time 4    Period Weeks    Status New    Target Date 08/31/21      PT SHORT TERM GOAL #2   Title Pt to demonstrate perfromance of 5xSTS c AFO/Hemi walker at supervision level independence without LOB or physical assist.    Baseline Needs minGuard to MinA to perform at eval    Time 4    Period Weeks    Status New    Target Date 08/31/21      PT SHORT TERM GOAL #3   Title Pt to demonstrate AMB ~53ft at supervision level with AFO, HW without LOB.    Baseline At eval limited by [facility] technique and AFO.    Time 6    Period Weeks    Status New    Target Date 09/14/21      PT SHORT TERM GOAL #4   Title Pt to demonstrate TUG test <120sec c HW and AFO at minguard - MinA level to improve  independence with transfers and reduce falls risk at home.    Baseline No tug performed at eval:    Time 7    Period Weeks  Status New    Target Date 09/21/21               PT Long Term Goals - 08/03/21 1647       PT LONG TERM GOAL #1   Title Pt to demonstrate perfromance of 5xSTS c AFO/Hemi walker at supervision level independence without LOB or physical assist in less than 20sec.    Baseline Unable at eval, requires minguard to minA    Time 8    Period Weeks    Status New    Target Date 09/28/21      PT LONG TERM GOAL #2   Title Pt to demonstrate FOTO score improvement >10 points to demonstrate improved perception of ability in ADL    Baseline 23 at eval:    Time 8    Period Weeks    Status New    Target Date 09/28/21      PT LONG TERM GOAL #3   Title Pt able to demonstrate stand pivot transfer with HW and AFO without LOB at supervision level to modI.    Baseline Needs minGuard to MinA for safety    Time 8    Period Weeks    Status New    Target Date 09/28/21      PT LONG TERM GOAL #4   Title Pt to demonstrate TUG test <60sec c HW and AFO at supervision level to improve independence with transfers and reduce falls risk at home.    Baseline No Tug at [facility]    Time 12    Period Weeks    Status New    Target Date 10/26/21                   Plan - 09/14/21 1531     Clinical Impression Statement Patient highly motivated throughout session.  Patient was able to progress hamstring strengthening exercises with use of red Thera-Band.  Patient exhibited good coordination with soccer ball kicks to strengthen left quad muscles.  The patient will benefit from further skilled PT to improve gait, strength, and safety with transfers in order to improve quality of life and reduce fall risk.    Personal Factors and Comorbidities Age;Comorbidity 3+;Fitness;Past/Current Experience;Time since onset of injury/illness/exacerbation    Comorbidities DM, HTN, PVD, clotting  disorder, CAD    Examination-Activity Limitations Squat;Stairs;Bathing;Bed Mobility;Dressing;Reach Overhead;Hygiene/Grooming;Bend;Carry;Locomotion Level;Transfers;Toileting;Stand    Examination-Participation Restrictions Cleaning;Community Activity;Occupation;Laundry;Driving;Yard Work    [employer] Evolving/Moderate complexity    Rehab Potential Good    PT Frequency 2x / week    PT Duration 12 weeks    PT Treatment/Interventions Electrical Stimulation;Gait training;Stair training;Functional mobility training;Therapeutic activities;Therapeutic exercise;Balance training;Neuromuscular re-education;Patient/family education;Manual techniques;Wheelchair mobility training;Energy conservation;Passive range of motion;Orthotic Fit/Training;DME Instruction;ADLs/Self Care Home Management    PT Next Visit Plan work toward supervision level STS and SPT c HW, sustained standing tolerance.    PT Home Exercise Plan Not updated this visit- Encouraged seated LE strengthening and practicing gait with husband.;  No updates    Consulted and Agree with Plan of Care Patient;Family member/caregiver    Family Member Consulted husband             Patient will benefit from skilled therapeutic intervention in order to improve the following deficits and impairments:  Decreased endurance, Decreased mobility, Difficulty walking, Hypomobility, Decreased balance, Abnormal gait, Increased muscle spasms, Cardiopulmonary status limiting activity, Impaired flexibility, Hypermobility, Decreased strength, Decreased activity tolerance  Visit Diagnosis: Other abnormalities of gait and mobility  Muscle weakness (generalized)  Other lack of coordination  Unsteadiness on feet     Problem List Patient Active Problem List   Diagnosis Date Noted   Cerebrovascular accident (CVA) due to occlusion of vertebral artery ([place]) 09/11/2021   Cognitive change 09/11/2021   Cognitive communication deficit  08/10/2021   Urinary incontinence 08/10/2021   GERD (gastroesophageal reflux disease) 08/03/2021   Chronic post-traumatic stress disorder (PTSD)    Depression with anxiety    Right pontine stroke ([place]) 06/28/2021   Hemiplegia and hemiparesis following cerebral infarction affecting left non-dominant side ([place]) 06/24/2021   Chronic diastolic CHF (congestive heart failure) ([place]) 06/24/2021   Fall 06/24/2021   Abnormal LFTs 06/24/2021   Olecranon bursitis of left elbow 06/12/2021   Chronic hip pain (Bilateral) 06/09/2021   Chronic use of opiate for therapeutic purpose 05/09/2021   Greater trochanteric bursitis of hip (Left) 03/09/2021   Bursitis of hip (Left) 45/40/9811   Uncomplicated opioid dependence ([place]) 02/20/2021   Acute conjunctivitis of left eye 01/02/2021   Unintentional weight loss 11/21/2020   Broken teeth (Right) 08/02/2020   History of MI (myocardial infarction) (July 2017) 08/02/2020   Neuropathy 06/09/2020   Dental abscess 06/09/2020   Foot drop (Left) 06/09/2020   Carotid stenosis, asymptomatic, right 04/27/2020   Chronic migraine 11/03/2019   Thrombocytosis 09/16/2019   Erythrocytosis 09/16/2019   Hypercalcemia 09/06/2019   Leukocytosis 09/06/2019   Polycythemia 09/06/2019   Chronic hip pain (Left) 08/27/2019   Osteoarthritis of hip (Left) 08/27/2019   Gluteal tendonitis of buttock (Left) 08/27/2019   Radial nerve palsy (Right) 07/15/2019   Neuropathy of radial nerve (Right) 06/30/2019   Abnormal bruising 06/25/2019   History of carotid endarterectomy (Right) 03/04/2019   Pain medication agreement signed 03/04/2019   Atypical facial pain (Right) 01/27/2019   Chronic ear pain (Right) 01/27/2019   Chronic jaw pain (Right) 01/27/2019   Geniculate Neuralgia (Right) 01/27/2019   Vitamin D deficiency 01/19/2019   Neurogenic pain 01/19/2019   Chronic anticoagulation (PLAVIX) 01/19/2019   Chronic pain syndrome 01/07/2019   Long term current use of opiate analgesic  01/07/2019   Long term prescription benzodiazepine use 01/07/2019   Pharmacologic therapy 01/07/2019   Disorder of skeletal system 01/07/2019   Problems influencing health status 01/07/2019   Chronic headaches (1ry area of Pain) (Right) 01/07/2019   Opiate use 09/24/2018   PVD (peripheral vascular disease) ([place]) 06/24/2018   Chronic ankle pain (Bilateral) 12/27/2017   Tendinopathy of gluteus medius (Right) 12/27/2017   Tendinopathy of gluteus medius (Left) 12/27/2017   Bilateral hip pain 12/26/2017   Chronic elbow pain (Left) 10/17/2017   Elevated troponin I level 05/21/2017   Major depressive disorder, recurrent episode, moderate (HCC) 11/19/2016   Insomnia 10/30/2016   Constipation 07/25/2016   S/P CABG x 4 07/06/2016   Bradycardia    CAD (coronary artery disease)    Carotid stenosis    CAD in native artery 06/29/2016   Elevated troponin 06/28/2016   Essential hypertension, malignant 06/28/2016   Mild tobacco abuse in early remission 06/28/2016   Essential hypertension    Malignant hypertension    Type 2 diabetes mellitus with other specified complication (HCC)    Chest pain with high risk for cardiac etiology 06/27/2016   NSTEMI (non-ST elevated myocardial infarction) ([place]) 06/27/2016   Proteinuria 03/15/2016   Renal artery stenosis ([place]) 03/15/2016   MDD (major depressive disorder) 10/17/2015   Agoraphobia with panic attacks 04/25/2015   HTN (hypertension), malignant 10/20/2013   Cluster headache 03/20/2012   HLD (hyperlipidemia)  07/26/2010   ADJUSTMENT DISORDER WITH MIXED FEATURES 07/26/2010    Zollie Pee, PT 09/14/2021, 3:34 PM  [place] MAIN [facility] SERVICES [address] [place], [place], [address] Phone: [phone]   Fax:  [phone]  Name: Erika [name] MRN: [id number] Date of Birth: Apr 18, 1971

## 2021-09-13 NOTE — Therapy (Signed)
[place] MAIN [facility] SERVICES [address] [place], [place], [address] Phone: [phone]   Fax:  [phone]  Speech Language Pathology Treatment  Patient Details  Name: Erika [name] MRN: [id number] Date of Birth: Sep 17, 1971 Referring Provider (SLP): Ranell Patrick Clide Deutscher, MD   Encounter Date: 09/13/2021   End of Session - 09/13/21 1726     Visit Number 7    Number of Visits 20    Date for SLP Re-Evaluation 11/02/21    Authorization Type [employer] - 20 visits per calendar year for OT, PT, and ST combined. counts as 1 visit if same day    Authorization - Visit Number 7    Authorization - Number of Visits 20    Progress Note Due on Visit 10    SLP Start Time 1400    SLP Stop Time  1500    SLP Time Calculation (min) 60 min    Activity Tolerance Patient tolerated treatment well             Past Medical History:  Diagnosis Date   Arthralgia of temporomandibular joint    CAD, multiple vessel    a. 06/2016 Cath: ostLM 40%, ostLAD 40%, pLAD 95%, ost-pLCx 60%, pLCx 95%, mLCx 60%, mRCA 95%, D2 50%, LVSF nl;  b. 07/2016 CABG x 4 (LIMA->LAD, VG->Diag, VG->OM, VG->RCA); c. 08/2016 Cath: 3VD w/ 4/4 patent grafts. LAD distal to LIMA has diff dzs->Med rx; d. 08/2020 Cath: 4/4 patent grafts, native 3VD. EF 55-65%-->Med Rx.   Carotid arterial disease ([place])    a. 07/2016 s/p R CEA; b. 02/2021 U/S: RICA 41-74%, LICA 0-81%.   Clotting disorder ([place])    Depression    Diastolic dysfunction    a. 06/2016 Echo: EF 50-55%, mild inf wall HK, GR1DD, mild MR, RV sys fxn nl, mildly dilated LA, PASP nl   Fatty liver disease, nonalcoholic 4481   History of blood transfusion    with heart surgery   HLD (hyperlipidemia)    Labile hypertension    a. prior renal ngiogram negative for RAS in 03/2016; b. catecholamines and metanephrines normal, mildly elevated renin with normal aldosterone and normal ratio in 02/2016   Myocardial infarction [facility]) 2017   Obesity    PAD  (peripheral artery disease) ([place])    a. 09/2018 s/p L SFA stenting; b. 07/2019 Periph Angio: Patent m/d L SFA stent w/ 100% L SFA distal to stent. L AT 100d, L Peroneal diff dzs-->Med Rx; c. 02/2021 ABIs: stable @ 0.61 on R and 0.46 on L.   PTSD (post-traumatic stress disorder)    Tobacco abuse    Type 2 diabetes mellitus ([place]) 12/2015    Past Surgical History:  Procedure Laterality Date   ABDOMINAL AORTOGRAM W/LOWER EXTREMITY [name] 10/15/2018   Procedure: ABDOMINAL AORTOGRAM W/LOWER EXTREMITY;  Surgeon: Wellington Hampshire, MD;  Location: [place] CV LAB;  Service: Cardiovascular;  Laterality: [name];   ABDOMINAL AORTOGRAM W/LOWER EXTREMITY Bilateral 08/19/2019   Procedure: ABDOMINAL AORTOGRAM W/LOWER EXTREMITY;  Surgeon: Wellington Hampshire, MD;  Location: [place] CV LAB;  Service: Cardiovascular;  Laterality: Bilateral;   CARDIAC CATHETERIZATION [name] 06/29/2016   Procedure: Left Heart Cath and Coronary Angiography;  Surgeon: Minna Merritts, MD;  Location: [place] CV LAB;  Service: Cardiovascular;  Laterality: [name];   CARDIAC CATHETERIZATION [name] 08/29/2016   Procedure: Left Heart Cath and Cors/Grafts Angiography;  Surgeon: Wellington Hampshire, MD;  Location: [place] CV LAB;  Service: Cardiovascular;  Laterality: [name];  CESAREAN SECTION     CHOLECYSTECTOMY     CORONARY ARTERY BYPASS GRAFT [name] 07/06/2016   Procedure: CORONARY ARTERY BYPASS GRAFTING (CABG) x four, using left internal mammary artery and right leg greater saphenous vein harvested endoscopically;  Surgeon: Ivin Poot, MD;  Location: [place];  Service: Open Heart Surgery;  Laterality: [name];   ENDARTERECTOMY Right 07/06/2016   Procedure: ENDARTERECTOMY CAROTID;  Surgeon: Rosetta Posner, MD;  Location: [facility] OR;  Service: Vascular;  Laterality: Right;   ENDARTERECTOMY Right 04/27/2020   Procedure: REDO OF RIGHT ENDARTERECTOMY CAROTID;  Surgeon: Rosetta Posner, MD;  Location: [facility] OR;  Service: Vascular;  Laterality: Right;   LEFT HEART CATH  AND CORS/GRAFTS ANGIOGRAPHY [name] 08/24/2020   Procedure: LEFT HEART CATH AND CORS/GRAFTS ANGIOGRAPHY;  Surgeon: Wellington Hampshire, MD;  Location: [place] CV LAB;  Service: Cardiovascular;  Laterality: [name];   PERIPHERAL VASCULAR CATHETERIZATION [name] 04/18/2016   Procedure: Renal Angiography;  Surgeon: Wellington Hampshire, MD;  Location: [place] CV LAB;  Service: Cardiovascular;  Laterality: [name];   PERIPHERAL VASCULAR INTERVENTION Left 10/15/2018   Procedure: PERIPHERAL VASCULAR INTERVENTION;  Surgeon: Wellington Hampshire, MD;  Location: [place] CV LAB;  Service: Cardiovascular;  Laterality: Left;  Left superficial femoral   TEE WITHOUT CARDIOVERSION [name] 07/06/2016   Procedure: TRANSESOPHAGEAL ECHOCARDIOGRAM (TEE);  Surgeon: Ivin Poot, MD;  Location: [place];  Service: Open Heart Surgery;  Laterality: [name];   TONSILLECTOMY      There were no vitals filed for this visit.   Subjective Assessment - 09/13/21 1719     Subjective "I keep falling."    Patient is accompained by: Family member   spouse   Currently in Pain? Yes    Pain Score 5     Pain Location Head                   ADULT SLP TREATMENT - 09/13/21 1719       General Information   Behavior/Cognition Alert;Cooperative    HPI Erika [name] is a 50 y.o. female admitted [facility]21 with slurred speech, left facial droop and LUE weakness; MRI showed acute R pontine CVA and chronic R parietal and occipital infarcts. Past medical history is noted for HTN, HLD, DM II, PAD, MI, CAD, s/p CABG, CHF, PTSD, adjustment disorder, obesity. Patient worked with [place] [place] in [place] 06/28/21-08/01/21. SLP targeted cognitive deficits and dysarthria, as well as dysphagia; upgraded to regular/thin by time of d/c home.      Treatment Provided   Treatment provided Cognitive-Linquistic      Cognitive-Linquistic Treatment   Treatment focused on Cognition;Patient/family/caregiver education    Skilled Treatment Missed last week due to medical  appointments; spouse reporting delirium, frequent falls. CT head showed no acute findings, prior strokes. Spouse feels impulsivity at root of pt's falls at home. He is using strategies at home including having pt verbalize steps of transfer. She has fallen trying to get up alone and when daughter assisting her. Spouse is looking into insurance approval for higher level of care due to safety concerns at home. Patient verbalized frustration about not being able to participate or help around the house. Had pt generate list of her responsibilities prior to CVA, and "new jobs" /different ways of participating. Pt ID'd (with cues) that she could assist with meal planning, making grocery list, and calling pharmacy about medications. Generated spreadsheet for meal planning; spouse to create spreadsheet for pt to list medication/pharmacy info at  home.      Assessment / Recommendations / Plan   Plan Continue with current plan of care      Progression Toward Goals   Progression toward goals Progressing toward goals              SLP Education - 09/13/21 1725     Education Details focus on ways she can participate    Person(s) Educated Patient;Spouse    Methods Explanation    Comprehension Verbalized understanding              SLP Short Term Goals - 08/04/21 1246       SLP SHORT TERM GOAL #1   Title Pt will verbalize awareness of impulsivity in simple tasks >80% of the time with min cues.    Time 10    Period --   sessions   Status New      SLP SHORT TERM GOAL #2   Title Pt will ID out-of-turn responses >80% with non-verbal cues.    Time 10    Period --   sessions   Status New      SLP SHORT TERM GOAL #3   Title Pt will verbalize steps to safety for transfers/gait training to reduce impulsive/unsafe movement.    Time 10    Period --   sessions   Status New      SLP SHORT TERM GOAL #4   Title Pt will verbalize daily schedule, recent events and salient information using external  memory aids.    Time 10    Period --   sessions   Status New      SLP SHORT TERM GOAL #5   Title Generate written or verbal cues to improve accuracy of 3 house hold tasks or safety.    Time 10    Period --   sessions   Status New              SLP Long Term Goals - 08/04/21 1334       SLP LONG TERM GOAL #1   Title Pt will verbalize or write steps prior to task initiation >80% with modified independence.    Time 12    Period Weeks    Status New    Target Date 11/01/21      SLP LONG TERM GOAL #2   Title Pt will demonstrate appropriate turn-taking >90% of the time in 15 minutes mod complex conversation.    Time 12    Period Weeks    Status New    Target Date 11/01/21      SLP LONG TERM GOAL #3   Title Pt will use external aids to organize/recall appointments and medical information for herself and her daughter.    Time 12    Period Weeks    Status New    Target Date 11/01/21      SLP LONG TERM GOAL #4   Title Pt will plan, organize and present a 20-30 minute lesson on personal or gun safety using external aids/compensations    Time 12    Period Weeks    Status New    Target Date 11/01/21      SLP LONG TERM GOAL #5   Title Patient will use compensations for dysarthria in 20 minute mod complex conversation outside of [place] room for >95% intelligibility.    Time 12    Period Weeks    Status New    Target Date 11/01/21  Plan - 09/13/21 1726     Clinical Impression Statement Rayma Hegg presents with overall mild-moderate cognitive communication deficits and mild dysarthria. Remains impulsive but responsive to feedback and made attemtps to slow rate to improve her accuracy. Frequent falls due to impulsivity; spouse is considering higher level of care. Recommend skilled ST to maximize cognitive and communication abilities to increase safety, independence, and life participation.    Speech Therapy Frequency 2x / week    Duration 12 weeks   20  visits allowed per calendar year per ins   Treatment/Interventions Language facilitation;Environmental controls;Cueing hierarchy;SLP instruction and feedback;Compensatory techniques;Cognitive reorganization;Functional tasks;Compensatory strategies;Internal/external aids;Multimodal communcation approach;Patient/family education    Potential to Achieve Goals Good    SLP Home Exercise Plan notebook/binder for therapies    Consulted and Agree with Plan of Care Patient;Family member/caregiver             Patient will benefit from skilled therapeutic intervention in order to improve the following deficits and impairments:   Cognitive communication deficit  Dysarthria and anarthria    Problem List Patient Active Problem List   Diagnosis Date Noted   Cerebrovascular accident (CVA) due to occlusion of vertebral artery ([place]) 09/11/2021   Cognitive change 09/11/2021   Cognitive communication deficit 08/10/2021   Urinary incontinence 08/10/2021   GERD (gastroesophageal reflux disease) 08/03/2021   Chronic post-traumatic stress disorder (PTSD)    Depression with anxiety    Right pontine stroke ([place]) 06/28/2021   Hemiplegia and hemiparesis following cerebral infarction affecting left non-dominant side ([place]) 06/24/2021   Chronic diastolic CHF (congestive heart failure) ([place]) 06/24/2021   Fall 06/24/2021   Abnormal LFTs 06/24/2021   Olecranon bursitis of left elbow 06/12/2021   Chronic hip pain (Bilateral) 06/09/2021   Chronic use of opiate for therapeutic purpose 05/09/2021   Greater trochanteric bursitis of hip (Left) 03/09/2021   Bursitis of hip (Left) 94/70/9628   Uncomplicated opioid dependence ([place]) 02/20/2021   Acute conjunctivitis of left eye 01/02/2021   Unintentional weight loss 11/21/2020   Broken teeth (Right) 08/02/2020   History of MI (myocardial infarction) (July 2017) 08/02/2020   Neuropathy 06/09/2020   Dental abscess 06/09/2020   Foot drop (Left) 06/09/2020   Carotid  stenosis, asymptomatic, right 04/27/2020   Chronic migraine 11/03/2019   Thrombocytosis 09/16/2019   Erythrocytosis 09/16/2019   Hypercalcemia 09/06/2019   Leukocytosis 09/06/2019   Polycythemia 09/06/2019   Chronic hip pain (Left) 08/27/2019   Osteoarthritis of hip (Left) 08/27/2019   Gluteal tendonitis of buttock (Left) 08/27/2019   Radial nerve palsy (Right) 07/15/2019   Neuropathy of radial nerve (Right) 06/30/2019   Abnormal bruising 06/25/2019   History of carotid endarterectomy (Right) 03/04/2019   Pain medication agreement signed 03/04/2019   Atypical facial pain (Right) 01/27/2019   Chronic ear pain (Right) 01/27/2019   Chronic jaw pain (Right) 01/27/2019   Geniculate Neuralgia (Right) 01/27/2019   Vitamin D deficiency 01/19/2019   Neurogenic pain 01/19/2019   Chronic anticoagulation (PLAVIX) 01/19/2019   Chronic pain syndrome 01/07/2019   Long term current use of opiate analgesic 01/07/2019   Long term prescription benzodiazepine use 01/07/2019   Pharmacologic therapy 01/07/2019   Disorder of skeletal system 01/07/2019   Problems influencing health status 01/07/2019   Chronic headaches (1ry area of Pain) (Right) 01/07/2019   Opiate use 09/24/2018   PVD (peripheral vascular disease) ([place]) 06/24/2018   Chronic ankle pain (Bilateral) 12/27/2017   Tendinopathy of gluteus medius (Right) 12/27/2017   Tendinopathy of gluteus medius (Left) 12/27/2017  Bilateral hip pain 12/26/2017   Chronic elbow pain (Left) 10/17/2017   Elevated troponin I level 05/21/2017   Major depressive disorder, recurrent episode, moderate ([place]) 11/19/2016   Insomnia 10/30/2016   Constipation 07/25/2016   S/P CABG x 4 07/06/2016   Bradycardia    CAD (coronary artery disease)    Carotid stenosis    CAD in native artery 06/29/2016   Elevated troponin 06/28/2016   Essential hypertension, malignant 06/28/2016   Mild tobacco abuse in early remission 06/28/2016   Essential hypertension     Malignant hypertension    Type 2 diabetes mellitus with other specified complication (HCC)    Chest pain with high risk for cardiac etiology 06/27/2016   NSTEMI (non-ST elevated myocardial infarction) ([place]) 06/27/2016   Proteinuria 03/15/2016   Renal artery stenosis ([place]) 03/15/2016   MDD (major depressive disorder) 10/17/2015   Agoraphobia with panic attacks 04/25/2015   HTN (hypertension), malignant 10/20/2013   Cluster headache 03/20/2012   HLD (hyperlipidemia) 07/26/2010   ADJUSTMENT DISORDER WITH MIXED FEATURES 07/26/2010   Deneise Lever, [place], [place] Speech-Language Pathologist  Aliene Altes 09/13/2021, 5:28 PM  [place] [address] [place], [place], [address] Phone: [phone]   Fax:  [phone]   Name: AUNDRIA BITTERMAN MRN: [id number] Date of Birth: 05-04-1971

## 2021-09-13 NOTE — Therapy (Signed)
[facility] SERVICES [address] [place], [place], [address] Phone: [phone]   Fax:  [phone]  Occupational Therapy Treatment  Patient Details  Name: Erika [name] MRN: [id number] Date of Birth: [birth year] Referring Provider (OT): Dr. Leeroy Cha   Encounter Date: 09/13/2021   OT End of Session - 09/13/21 1736     Visit Number 9    Number of Visits 20    Date for OT Re-Evaluation 10/25/21    Authorization Type limited to 20 visits combined/multidiscipline    OT Start Time 7062    OT Stop Time 1730    OT Time Calculation (min) 43 min    Activity Tolerance Patient tolerated treatment well    Behavior During Therapy [facility] for tasks assessed/performed;Flat affect;Impulsive             Past Medical History:  Diagnosis Date   Arthralgia of temporomandibular joint    CAD, multiple vessel    a. 06/2016 Cath: ostLM 40%, ostLAD 40%, pLAD 95%, ost-pLCx 60%, pLCx 95%, mLCx 60%, mRCA 95%, D2 50%, LVSF nl;  b. 07/2016 CABG x 4 (LIMA->LAD, VG->Diag, VG->OM, VG->RCA); c. 08/2016 Cath: 3VD w/ 4/4 patent grafts. LAD distal to LIMA has diff dzs->Med rx; d. 08/2020 Cath: 4/4 patent grafts, native 3VD. EF 55-65%-->Med Rx.   Carotid arterial disease ([place])    a. 07/2016 s/p R CEA; b. 02/2021 U/S: RICA 37-62%, LICA 8-31%.   Clotting disorder ([place])    Depression    Diastolic dysfunction    a. 06/2016 Echo: EF 50-55%, mild inf wall HK, GR1DD, mild MR, RV sys fxn nl, mildly dilated LA, PASP nl   Fatty liver disease, nonalcoholic 5176   History of blood transfusion    with heart surgery   HLD (hyperlipidemia)    Labile hypertension    a. prior renal ngiogram negative for RAS in 03/2016; b. catecholamines and metanephrines normal, mildly elevated renin with normal aldosterone and normal ratio in 02/2016   Myocardial infarction [facility]) 2017   Obesity    PAD (peripheral artery disease) ([place])    a. 09/2018 s/p L SFA stenting; b. 07/2019 Periph  Angio: Patent m/d L SFA stent w/ 100% L SFA distal to stent. L AT 100d, L Peroneal diff dzs-->Med Rx; c. 02/2021 ABIs: stable @ 0.61 on R and 0.46 on L.   PTSD (post-traumatic stress disorder)    Tobacco abuse    Type 2 diabetes mellitus ([place]) 12/2015    Past Surgical History:  Procedure Laterality Date   ABDOMINAL AORTOGRAM W/LOWER EXTREMITY [name] 10/15/2018   Procedure: ABDOMINAL AORTOGRAM W/LOWER EXTREMITY;  Surgeon: Wellington Hampshire, MD;  Location: [place] [facility];  Service: Cardiovascular;  Laterality: [name];   ABDOMINAL AORTOGRAM W/LOWER EXTREMITY Bilateral 08/19/2019   Procedure: ABDOMINAL AORTOGRAM W/LOWER EXTREMITY;  Surgeon: Wellington Hampshire, MD;  Location: [place] [facility] LAB;  Service: Cardiovascular;  Laterality: Bilateral;   CARDIAC CATHETERIZATION [name] 06/29/2016   Procedure: Left Heart Cath and Coronary Angiography;  Surgeon: Minna Merritts, MD;  Location: [place] [facility];  Service: Cardiovascular;  Laterality: [name];   CARDIAC CATHETERIZATION [name] 08/29/2016   Procedure: Left Heart Cath and Cors/Grafts Angiography;  Surgeon: Wellington Hampshire, MD;  Location: [place] [facility];  Service: Cardiovascular;  Laterality: [name];   CESAREAN SECTION     CHOLECYSTECTOMY     CORONARY ARTERY BYPASS GRAFT [name] 07/06/2016   Procedure: CORONARY ARTERY BYPASS GRAFTING (CABG) x four, using left internal mammary  artery and right leg greater saphenous vein harvested endoscopically;  Surgeon: Ivin Poot, MD;  Location: [place];  Service: Open Heart Surgery;  Laterality: [name];   ENDARTERECTOMY Right 07/06/2016   Procedure: ENDARTERECTOMY CAROTID;  Surgeon: Rosetta Posner, MD;  Location: [facility] OR;  Service: Vascular;  Laterality: Right;   ENDARTERECTOMY Right 04/27/2020   Procedure: REDO OF RIGHT ENDARTERECTOMY CAROTID;  Surgeon: Rosetta Posner, MD;  Location: [facility] OR;  Service: Vascular;  Laterality: Right;   LEFT HEART CATH AND CORS/GRAFTS ANGIOGRAPHY [name] 08/24/2020   Procedure: LEFT HEART CATH AND CORS/GRAFTS  ANGIOGRAPHY;  Surgeon: Wellington Hampshire, MD;  Location: [place] [facility];  Service: Cardiovascular;  Laterality: [name];   PERIPHERAL VASCULAR CATHETERIZATION [name] 04/18/2016   Procedure: Renal Angiography;  Surgeon: Wellington Hampshire, MD;  Location: [place] [facility] LAB;  Service: Cardiovascular;  Laterality: [name];   PERIPHERAL VASCULAR INTERVENTION Left 10/15/2018   Procedure: PERIPHERAL VASCULAR INTERVENTION;  Surgeon: Wellington Hampshire, MD;  Location: [place] [facility];  Service: Cardiovascular;  Laterality: Left;  Left superficial femoral   TEE WITHOUT CARDIOVERSION [name] 07/06/2016   Procedure: TRANSESOPHAGEAL ECHOCARDIOGRAM (TEE);  Surgeon: Ivin Poot, MD;  Location: [place];  Service: Open Heart Surgery;  Laterality: [name];   TONSILLECTOMY      There were no vitals filed for this visit.   Subjective Assessment - 09/13/21 1735     Subjective  Pt. reports having had a neurologist appointment    Patient is accompanied by: Family member    Pertinent History HX of CAD, CABG, DM2, s/p R pontine stroke    Limitations L sided hemiplegia, impulsivity, judgment, safety awareness    Currently in Pain? Yes    Pain Score 5     Pain Location Head    Pain Orientation --   Cluster headaches           OT TREATMENT     Therapeutic Exercise:   Pt. tolerated PROM in all joint ranges for left shoulder flexion, extension, abduction, horizontal abduction, and adduction, elbow flexion, extension, forearm supination, pronation, wrist extension, digit MP, PIP, and DIP flexion, and extension, Thumb abduction. Pt. Performed wrist extension with facilitation at the forearm for wrist extension, and digit extension.    Manual Therapy:   Pt. tolerated scapular mobilizations for elevation, depression, abduction/rotation.  Pt. tolerated retrograde massage, and soft tissue mobilizations for metacarpal spread stretches to the left hand secondary to increased edema in the left hand. Manual therapy was performed  independent of, and in preparation for ther. Ex.   Pt. reports 5/10 constant cluster headache pain. Pt. reports that she had 2 falls this morning where her LLE buckled on her.  Pt. tolerated ROM, and manual therapy well with decreased edema noted in the left hand following intervention. Pt. Tolerated LUE weightbearing through open through open chain plains. Pt. was able to tolerate ROM to the LUE. Pt. presented with decreased edema following ROM. Pt. education was provided about, positioning of the LUE, edema, as well as finding opportunities to engage the LUE as a gross assist during daily activities. Pt. continues to work on improving and maximizing independence with ADLs, and IADL tasks.                                OT Education - 09/13/21 1736     Education Details LUE proximal AROM/AAROM, supportive positioning strategies for bed  Person(s) Educated Patient;Spouse    Methods Explanation;Demonstration;Verbal cues    Comprehension Verbalized understanding;Returned demonstration;Need further instruction;Verbal cues required              OT Short Term Goals - 08/04/21 0914       OT SHORT TERM GOAL #1   Title Pt will be perform HEP for LUE with min vc.    Baseline Eval: Initiated at eval, further instruction needed    Time 6    Period Weeks    Status New    Target Date 09/13/21               OT Long Term Goals - 08/04/21 0915       OT LONG TERM GOAL #1   Title Pt will increase FOTO score to 40 or better to indicate increased functional performance    Baseline Eval: 23    Time 12    Period Weeks    Status New    Target Date 10/25/21      OT LONG TERM GOAL #2   Title Pt will perform UB ADLs with set up    Baseline Eval: Mod A to perform UB ADLs    Time 12    Period Weeks    Status New    Target Date 10/25/21      OT LONG TERM GOAL #3   Title Pt will perform LB ADLs with min A    Baseline Eval: Max A to perform LB ADLs    Time 12     Period Weeks    Status New    Target Date 10/25/21      OT LONG TERM GOAL #4   Title Pt will perform ADL transfers with distant supv    Baseline Eval: min A-mod A to perform ADL transfers    Time 12    Period Weeks    Status New    Target Date 10/25/21      OT LONG TERM GOAL #5   Title Pt will increase LUE strength by 2 MM grades to work towards use of LUE as a stabilizer for self care.    Baseline Eval: L shoulder 1/5, elbow/wrist/hand 0/5.  Unable to use LUE as a stabilzer for self care.    Time 12    Period Weeks    Status New    Target Date 10/25/21                   Plan - 09/13/21 1740     Clinical Impression Statement Pt. reports 5/10 constant cluster headache pain. Pt. reports that she had 2 falls this morning where her LLE buckled on her.  Pt. tolerated ROM, and manual therapy well with decreased edema noted in the left hand following intervention. Pt. Tolerated LUE weightbearing through open through open chain [place]. Pt. was able to tolerate ROM to the LUE. Pt. presented with decreased edema following ROM. Pt. education was provided about, positioning of the LUE, edema, as well as finding opportunities to engage the LUE as a gross assist during daily activities. Pt. continues to work on improving and maximizing independence with ADLs, and IADL tasks.   OT Occupational Profile and History Detailed Assessment- Review of Records and additional review of physical, cognitive, psychosocial history related to current functional performance    Occupational performance deficits (Please refer to evaluation for details): ADL's;IADL's;Leisure;Work    Marketing executive / Function / Physical Skills ADL;Continence;Dexterity;Strength;Vision;Balance;Coordination;FMC;IADL;Body mechanics;Endurance;Gait;UE functional use;Decreased knowledge of use of  DME;GMC;Mobility    Rehab Potential Good    Clinical Decision Making Several treatment options, min-mod task modification necessary     Comorbidities Affecting Occupational Performance: May have comorbidities impacting occupational performance    Modification or Assistance to Complete Evaluation  Min-Moderate modification of tasks or assist with assess necessary to complete eval    OT Frequency 2x / week    OT Duration 12 weeks    OT Treatment/Interventions Self-care/ADL training;Therapeutic exercise;DME and/or AE instruction;Functional Mobility Training;Balance training;Electrical Stimulation;Neuromuscular education;Manual Therapy;Splinting;Visual/perceptual remediation/compensation;Moist Heat;Passive range of motion;Therapeutic activities;Patient/family education    Consulted and Agree with Plan of Care Patient;Family member/caregiver             Patient will benefit from skilled therapeutic intervention in order to improve the following deficits and impairments:   Body Structure / Function / Physical Skills: ADL, Continence, Dexterity, Strength, Vision, Balance, Coordination, [place], IADL, Body mechanics, Endurance, Gait, UE functional use, Decreased knowledge of use of DME, [place], Mobility       Visit Diagnosis: Muscle weakness (generalized)    Problem List Patient Active Problem List   Diagnosis Date Noted   Cerebrovascular accident (CVA) due to occlusion of vertebral artery ([place]) 09/11/2021   Cognitive change 09/11/2021   Cognitive communication deficit 08/10/2021   Urinary incontinence 08/10/2021   GERD (gastroesophageal reflux disease) 08/03/2021   Chronic post-traumatic stress disorder (PTSD)    Depression with anxiety    Right pontine stroke ([place]) 06/28/2021   Hemiplegia and hemiparesis following cerebral infarction affecting left non-dominant side ([place]) 06/24/2021   Chronic diastolic CHF (congestive heart failure) ([place]) 06/24/2021   Fall 06/24/2021   Abnormal LFTs 06/24/2021   Olecranon bursitis of left elbow 06/12/2021   Chronic hip pain (Bilateral) 06/09/2021   Chronic use of opiate for therapeutic  purpose 05/09/2021   Greater trochanteric bursitis of hip (Left) 03/09/2021   Bursitis of hip (Left) 46/96/2952   Uncomplicated opioid dependence ([place]) 02/20/2021   Acute conjunctivitis of left eye 01/02/2021   Unintentional weight loss 11/21/2020   Broken teeth (Right) 08/02/2020   History of MI (myocardial infarction) (July 2017) 08/02/2020   Neuropathy 06/09/2020   Dental abscess 06/09/2020   Foot drop (Left) 06/09/2020   Carotid stenosis, asymptomatic, right 04/27/2020   Chronic migraine 11/03/2019   Thrombocytosis 09/16/2019   Erythrocytosis 09/16/2019   Hypercalcemia 09/06/2019   Leukocytosis 09/06/2019   Polycythemia 09/06/2019   Chronic hip pain (Left) 08/27/2019   Osteoarthritis of hip (Left) 08/27/2019   Gluteal tendonitis of buttock (Left) 08/27/2019   Radial nerve palsy (Right) 07/15/2019   Neuropathy of radial nerve (Right) 06/30/2019   Abnormal bruising 06/25/2019   History of carotid endarterectomy (Right) 03/04/2019   Pain medication agreement signed 03/04/2019   Atypical facial pain (Right) 01/27/2019   Chronic ear pain (Right) 01/27/2019   Chronic jaw pain (Right) 01/27/2019   Geniculate Neuralgia (Right) 01/27/2019   Vitamin D deficiency 01/19/2019   Neurogenic pain 01/19/2019   Chronic anticoagulation (PLAVIX) 01/19/2019   Chronic pain syndrome 01/07/2019   Long term current use of opiate analgesic 01/07/2019   Long term prescription benzodiazepine use 01/07/2019   Pharmacologic therapy 01/07/2019   Disorder of skeletal system 01/07/2019   Problems influencing health status 01/07/2019   Chronic headaches (1ry area of Pain) (Right) 01/07/2019   Opiate use 09/24/2018   PVD (peripheral vascular disease) ([place]) 06/24/2018   Chronic ankle pain (Bilateral) 12/27/2017   Tendinopathy of gluteus medius (Right) 12/27/2017   Tendinopathy of gluteus medius (Left) 12/27/2017  Bilateral hip pain 12/26/2017   Chronic elbow pain (Left) 10/17/2017   Elevated troponin  I level 05/21/2017   Major depressive disorder, recurrent episode, moderate ([place]) 11/19/2016   Insomnia 10/30/2016   Constipation 07/25/2016   S/P CABG x 4 07/06/2016   Bradycardia    CAD (coronary artery disease)    Carotid stenosis    CAD in native artery 06/29/2016   Elevated troponin 06/28/2016   Essential hypertension, malignant 06/28/2016   Mild tobacco abuse in early remission 06/28/2016   Essential hypertension    Malignant hypertension    Type 2 diabetes mellitus with other specified complication (HCC)    Chest pain with high risk for cardiac etiology 06/27/2016   NSTEMI (non-ST elevated myocardial infarction) ([place]) 06/27/2016   Proteinuria 03/15/2016   Renal artery stenosis ([place]) 03/15/2016   MDD (major depressive disorder) 10/17/2015   Agoraphobia with panic attacks 04/25/2015   HTN (hypertension), malignant 10/20/2013   Cluster headache 03/20/2012   HLD (hyperlipidemia) 07/26/2010   ADJUSTMENT DISORDER WITH MIXED FEATURES 07/26/2010    Harrel Carina, MS, OTR/L 09/13/2021, 5:41 PM  [place] [facility] SERVICES [place], [place], [address] Phone: [phone]   Fax:  [phone]  Name: Erika [name] MRN: [id number] Date of Birth: June 13, 1971

## 2021-09-14 ENCOUNTER — Telehealth: Payer: Self-pay | Admitting: Physical Medicine and Rehabilitation

## 2021-09-14 NOTE — Telephone Encounter (Signed)
Caryl Pina nurse case manager with [facility] has been playing phone tag with Dr. Ranell Patrick.  If patient is needing to be sent to inpatient, please call [phone] follow prompts for pre certification.  Once this has been done, please fax over an order with documentation to [phone] (this will go to [facility]).  If you have any questions please call Caryl Pina at [phone] ext. [phone].

## 2021-09-15 ENCOUNTER — Telehealth: Payer: Self-pay | Admitting: Neurology

## 2021-09-15 LAB — URINE CULTURE

## 2021-09-15 MED ORDER — SULFAMETHOXAZOLE-TRIMETHOPRIM 800-160 MG PO TABS: 1.0000 | ORAL_TABLET | Freq: Two times a day (BID) | ORAL | 0 refills | Status: DC

## 2021-09-15 NOTE — Telephone Encounter (Signed)
The urinalysis showed white blood cells and the urine culture showed Klebsiella.  It was sensitive to most antibiotics and I will send in a prescription for Bactrim.  I spoke to Ms. Stockert to let her know the results

## 2021-09-18 ENCOUNTER — Ambulatory Visit: Payer: Managed Care, Other (non HMO) | Admitting: Occupational Therapy

## 2021-09-18 ENCOUNTER — Ambulatory Visit: Payer: Managed Care, Other (non HMO) | Admitting: Physical Therapy

## 2021-09-18 ENCOUNTER — Other Ambulatory Visit: Payer: Self-pay

## 2021-09-18 DIAGNOSIS — R2681 Unsteadiness on feet: Secondary | ICD-10-CM

## 2021-09-18 DIAGNOSIS — R278 Other lack of coordination: Secondary | ICD-10-CM

## 2021-09-18 DIAGNOSIS — R262 Difficulty in walking, not elsewhere classified: Secondary | ICD-10-CM

## 2021-09-18 DIAGNOSIS — R41841 Cognitive communication deficit: Secondary | ICD-10-CM | POA: Diagnosis not present

## 2021-09-18 DIAGNOSIS — R269 Unspecified abnormalities of gait and mobility: Secondary | ICD-10-CM

## 2021-09-18 DIAGNOSIS — M6281 Muscle weakness (generalized): Secondary | ICD-10-CM

## 2021-09-18 DIAGNOSIS — R2689 Other abnormalities of gait and mobility: Secondary | ICD-10-CM

## 2021-09-18 NOTE — Therapy (Signed)
[place] MAIN [facility] SERVICES [address] [place], [place], [address] Phone: [phone]   Fax:  [phone]  Occupational Therapy Treatment/ Occupational Therapy Progress Note  Dates of reporting period  08/03/2021   to   09/18/2021   Patient Details  Name: Erika [name] MRN: [id number] Date of Birth: 1971-05-29 Referring Provider (OT): Dr. Leeroy Cha   Encounter Date: 09/18/2021   OT End of Session - 09/18/21 1720     Visit Number 10    Number of Visits 20    Date for OT Re-Evaluation 10/25/21    Authorization Type limited to 20 visits combined/multidiscipline    OT Start Time 1600    OT Stop Time 1650    OT Time Calculation (min) 50 min    Activity Tolerance Patient tolerated treatment well    Behavior During Therapy [facility] for tasks assessed/performed;Flat affect;Impulsive             Past Medical History:  Diagnosis Date   Arthralgia of temporomandibular joint    CAD, multiple vessel    a. 06/2016 Cath: ostLM 40%, ostLAD 40%, pLAD 95%, ost-pLCx 60%, pLCx 95%, mLCx 60%, mRCA 95%, D2 50%, LVSF nl;  b. 07/2016 CABG x 4 (LIMA->LAD, VG->Diag, VG->OM, VG->RCA); c. 08/2016 Cath: 3VD w/ 4/4 patent grafts. LAD distal to LIMA has diff dzs->Med rx; d. 08/2020 Cath: 4/4 patent grafts, native 3VD. EF 55-65%-->Med Rx.   Carotid arterial disease ([place])    a. 07/2016 s/p R CEA; b. 02/2021 U/S: RICA 26-33%, LICA 3-54%.   Clotting disorder ([place])    Depression    Diastolic dysfunction    a. 06/2016 Echo: EF 50-55%, mild inf wall HK, GR1DD, mild MR, RV sys fxn nl, mildly dilated LA, PASP nl   Fatty liver disease, nonalcoholic 5625   History of blood transfusion    with heart surgery   HLD (hyperlipidemia)    Labile hypertension    a. prior renal ngiogram negative for RAS in 03/2016; b. catecholamines and metanephrines normal, mildly elevated renin with normal aldosterone and normal ratio in 02/2016   Myocardial infarction [facility]) 2017   Obesity     PAD (peripheral artery disease) ([place])    a. 09/2018 s/p L SFA stenting; b. 07/2019 Periph Angio: Patent m/d L SFA stent w/ 100% L SFA distal to stent. L AT 100d, L Peroneal diff dzs-->Med Rx; c. 02/2021 ABIs: stable @ 0.61 on R and 0.46 on L.   PTSD (post-traumatic stress disorder)    Tobacco abuse    Type 2 diabetes mellitus ([place]) 12/2015    Past Surgical History:  Procedure Laterality Date   ABDOMINAL AORTOGRAM W/LOWER EXTREMITY [name] 10/15/2018   Procedure: ABDOMINAL AORTOGRAM W/LOWER EXTREMITY;  Surgeon: Wellington Hampshire, MD;  Location: [place] CV LAB;  Service: Cardiovascular;  Laterality: [name];   ABDOMINAL AORTOGRAM W/LOWER EXTREMITY Bilateral 08/19/2019   Procedure: ABDOMINAL AORTOGRAM W/LOWER EXTREMITY;  Surgeon: Wellington Hampshire, MD;  Location: [place] CV LAB;  Service: Cardiovascular;  Laterality: Bilateral;   CARDIAC CATHETERIZATION [name] 06/29/2016   Procedure: Left Heart Cath and Coronary Angiography;  Surgeon: Minna Merritts, MD;  Location: [place] CV LAB;  Service: Cardiovascular;  Laterality: [name];   CARDIAC CATHETERIZATION [name] 08/29/2016   Procedure: Left Heart Cath and Cors/Grafts Angiography;  Surgeon: Wellington Hampshire, MD;  Location: [place] CV LAB;  Service: Cardiovascular;  Laterality: [name];   CESAREAN SECTION     CHOLECYSTECTOMY     CORONARY ARTERY  BYPASS GRAFT [name] 07/06/2016   Procedure: CORONARY ARTERY BYPASS GRAFTING (CABG) x four, using left internal mammary artery and right leg greater saphenous vein harvested endoscopically;  Surgeon: Ivin Poot, MD;  Location: [place];  Service: Open Heart Surgery;  Laterality: [name];   ENDARTERECTOMY Right 07/06/2016   Procedure: ENDARTERECTOMY CAROTID;  Surgeon: Rosetta Posner, MD;  Location: [facility] OR;  Service: Vascular;  Laterality: Right;   ENDARTERECTOMY Right 04/27/2020   Procedure: REDO OF RIGHT ENDARTERECTOMY CAROTID;  Surgeon: Rosetta Posner, MD;  Location: [facility] OR;  Service: Vascular;  Laterality: Right;   LEFT HEART  CATH AND CORS/GRAFTS ANGIOGRAPHY [name] 08/24/2020   Procedure: LEFT HEART CATH AND CORS/GRAFTS ANGIOGRAPHY;  Surgeon: Wellington Hampshire, MD;  Location: [place] CV LAB;  Service: Cardiovascular;  Laterality: [name];   PERIPHERAL VASCULAR CATHETERIZATION [name] 04/18/2016   Procedure: Renal Angiography;  Surgeon: Wellington Hampshire, MD;  Location: [place] CV LAB;  Service: Cardiovascular;  Laterality: [name];   PERIPHERAL VASCULAR INTERVENTION Left 10/15/2018   Procedure: PERIPHERAL VASCULAR INTERVENTION;  Surgeon: Wellington Hampshire, MD;  Location: [place] CV LAB;  Service: Cardiovascular;  Laterality: Left;  Left superficial femoral   TEE WITHOUT CARDIOVERSION [name] 07/06/2016   Procedure: TRANSESOPHAGEAL ECHOCARDIOGRAM (TEE);  Surgeon: Ivin Poot, MD;  Location: [place];  Service: Open Heart Surgery;  Laterality: [name];   TONSILLECTOMY      There were no vitals filed for this visit.   Subjective Assessment - 09/18/21 1719     Subjective  Pt. reports having had a neurologist appointment    Patient is accompanied by: Family member    Pertinent History HX of CAD, CABG, DM2, s/p R pontine stroke    Limitations L sided hemiplegia, impulsivity, judgment, safety awareness    Currently in Pain? Yes    Pain Score 10-Worst pain ever    Pain Location Shoulder    Pain Orientation Left    Pain Descriptors / Indicators Aching;Sore               OT TREATMENT     Therapeutic Exercise:   Pt. tolerated PROM in all joint ranges for left shoulder flexion, extension, abduction, horizontal abduction, and adduction, elbow flexion, extension, forearm supination, pronation, wrist extension, digit MP, PIP, and DIP flexion, and extension, Thumb abduction.   Manual Therapy:   Pt. tolerated scapular mobilizations for elevation, depression, abduction/rotation.  Pt. tolerated retrograde massage, and soft tissue mobilizations for metacarpal spread stretches to the left hand secondary to increased edema in the left  hand. Manual therapy was performed independent of, and in preparation for ther. Ex.   Measurements were obtained, and goals were reviewed with pt.  FOTO score: 39. Pt. reports 10/10 left shoulder pain. Pt. reports that she had 2 falls this morning where her LLE buckled on her. Pt. Reports being able to donna shirt with minA, and doffing independently, and is able to hike pants with minA for the posterior waistband. Pt. requires MaxA donning socks, and shoes. Pt. tolerated ROM, and manual therapy well with decreased edema noted in the left hand following intervention. Pt. presented with decreased edema following ROM. Pt. education was provided about, positioning of the LUE, edema, as well as finding opportunities to engage the LUE as a gross assist during daily activities. Pt. continues to work on improving and maximizing independence with ADLs, and IADL tasks.             [facility] OT Assessment - 09/18/21 1730  AROM   Overall AROM Comments LUE: PAssive seated: shoulder flexion: 90, abduction: 100, elbow extension: -10, elbow flexion: 140, wrist extension 40, wrist flexion: 48                              OT Education - 09/18/21 1720     Education Details LUE proximal AROM/AAROM, supportive positioning strategies for bed    Methods Explanation;Demonstration;Verbal cues    Comprehension Verbalized understanding;Returned demonstration;Need further instruction;Verbal cues required              OT Short Term Goals - 09/18/21 1542       OT SHORT TERM GOAL #1   Title Pt will be perform HEP for LUE with min vc.    Baseline 10th visit: Pt. requires assist to perform Self-ROM. Eval: Initiated at eval, further instruction needed    Time 6    Period Weeks    Status On-going               OT Long Term Goals - 09/18/21 1536       OT LONG TERM GOAL #2   Title Pt will perform UB ADLs with set up    Baseline 10th vist: independent doffing a shirt, MinA donning     Time 12    Period Weeks    Status On-going    Target Date 10/25/21      OT LONG TERM GOAL #3   Title Pt will perform LB ADLs with min A    Baseline 10th visit: Pt. is able to donn pants with minA hiking the back waistband. Pt. requires maxA donning shoes, ans socks. Eval: Max A to perform LB ADLs    Time 12    Period Weeks    Status On-going    Target Date 10/25/21      OT LONG TERM GOAL #4   Title Pt will perform ADL transfers with distant supv    Baseline 10th visit: CGA. Eval: min A-mod A to perform ADL transfers    Time 12    Period Weeks    Status On-going    Target Date 10/25/21      OT LONG TERM GOAL #5   Title Pt will increase LUE strength by 2 MM grades to work towards use of LUE as a stabilizer for self care.    Baseline 10th visit: Pt. is starting to engage her left hand as a stabilizer.: L shoulder 1/5, elbow/wrist/hand 0/5.  Unable to use LUE as a stabilzer for self care.    Time 12    Period Weeks    Status On-going    Target Date 10/25/21                   Plan - 09/18/21 1722     Clinical Impression Statement Measurements were obtained, and goals were reviewed with pt.  FOTO score: 39. Pt. reports 10/10 left shoulder pain. Pt. reports that she had 2 falls this morning where her LLE buckled on her. Pt. Reports being able to donna shirt with minA, and doffing independently, and is able to hike pants with minA for the posterior waistband. Pt. requires MaxA donning socks, and shoes. Pt. tolerated ROM, and manual therapy well with decreased edema noted in the left hand following intervention. Pt. presented with decreased edema following ROM. Pt. education was provided about, positioning of the LUE, edema, as well as finding opportunities to engage  the LUE as a gross assist during daily activities. Pt. continues to work on improving and maximizing independence with ADLs, and IADL tasks.   OT Occupational Profile and History Detailed Assessment- Review of  Records and additional review of physical, cognitive, psychosocial history related to current functional performance    Occupational performance deficits (Please refer to evaluation for details): ADL's;IADL's;Leisure;Work    Marketing executive / Function / Physical Skills ADL;Continence;Dexterity;Strength;Vision;Balance;Coordination;FMC;IADL;Body mechanics;Endurance;Gait;UE functional use;Decreased knowledge of use of DME;GMC;Mobility    Rehab Potential Good    Clinical Decision Making Several treatment options, min-mod task modification necessary    Comorbidities Affecting Occupational Performance: May have comorbidities impacting occupational performance    Modification or Assistance to Complete Evaluation  Min-Moderate modification of tasks or assist with assess necessary to complete eval    OT Frequency 2x / week    OT Duration 12 weeks    OT Treatment/Interventions Self-care/ADL training;Therapeutic exercise;DME and/or AE instruction;Functional Mobility Training;Balance training;Electrical Stimulation;Neuromuscular education;Manual Therapy;Splinting;Visual/perceptual remediation/compensation;Moist Heat;Passive range of motion;Therapeutic activities;Patient/family education    Consulted and Agree with Plan of Care Patient;Family member/caregiver    Family Member Consulted --             Patient will benefit from skilled therapeutic intervention in order to improve the following deficits and impairments:   Body Structure / Function / Physical Skills: ADL, Continence, Dexterity, Strength, Vision, Balance, Coordination, FMC, IADL, Body mechanics, Endurance, Gait, UE functional use, Decreased knowledge of use of DME, GMC, Mobility       Visit Diagnosis: Muscle weakness (generalized)    Problem List Patient Active Problem List   Diagnosis Date Noted   Cerebrovascular accident (CVA) due to occlusion of vertebral artery ([place]) 09/11/2021   Cognitive change 09/11/2021   Cognitive  communication deficit 08/10/2021   Urinary incontinence 08/10/2021   GERD (gastroesophageal reflux disease) 08/03/2021   Chronic post-traumatic stress disorder (PTSD)    Depression with anxiety    Right pontine stroke ([place]) 06/28/2021   Hemiplegia and hemiparesis following cerebral infarction affecting left non-dominant side ([place]) 06/24/2021   Chronic diastolic CHF (congestive heart failure) ([place]) 06/24/2021   Fall 06/24/2021   Abnormal LFTs 06/24/2021   Olecranon bursitis of left elbow 06/12/2021   Chronic hip pain (Bilateral) 06/09/2021   Chronic use of opiate for therapeutic purpose 05/09/2021   Greater trochanteric bursitis of hip (Left) 03/09/2021   Bursitis of hip (Left) 88/32/5498   Uncomplicated opioid dependence ([place]) 02/20/2021   Acute conjunctivitis of left eye 01/02/2021   Unintentional weight loss 11/21/2020   Broken teeth (Right) 08/02/2020   History of MI (myocardial infarction) (July 2017) 08/02/2020   Neuropathy 06/09/2020   Dental abscess 06/09/2020   Foot drop (Left) 06/09/2020   Carotid stenosis, asymptomatic, right 04/27/2020   Chronic migraine 11/03/2019   Thrombocytosis 09/16/2019   Erythrocytosis 09/16/2019   Hypercalcemia 09/06/2019   Leukocytosis 09/06/2019   Polycythemia 09/06/2019   Chronic hip pain (Left) 08/27/2019   Osteoarthritis of hip (Left) 08/27/2019   Gluteal tendonitis of buttock (Left) 08/27/2019   Radial nerve palsy (Right) 07/15/2019   Neuropathy of radial nerve (Right) 06/30/2019   Abnormal bruising 06/25/2019   History of carotid endarterectomy (Right) 03/04/2019   Pain medication agreement signed 03/04/2019   Atypical facial pain (Right) 01/27/2019   Chronic ear pain (Right) 01/27/2019   Chronic jaw pain (Right) 01/27/2019   Geniculate Neuralgia (Right) 01/27/2019   Vitamin D deficiency 01/19/2019   Neurogenic pain 01/19/2019   Chronic anticoagulation (PLAVIX) 01/19/2019   Chronic pain  syndrome 01/07/2019   Long term current  use of opiate analgesic 01/07/2019   Long term prescription benzodiazepine use 01/07/2019   Pharmacologic therapy 01/07/2019   Disorder of skeletal system 01/07/2019   Problems influencing health status 01/07/2019   Chronic headaches (1ry area of Pain) (Right) 01/07/2019   Opiate use 09/24/2018   PVD (peripheral vascular disease) ([place]) 06/24/2018   Chronic ankle pain (Bilateral) 12/27/2017   Tendinopathy of gluteus medius (Right) 12/27/2017   Tendinopathy of gluteus medius (Left) 12/27/2017   Bilateral hip pain 12/26/2017   Chronic elbow pain (Left) 10/17/2017   Elevated troponin I level 05/21/2017   Major depressive disorder, recurrent episode, moderate ([place]) 11/19/2016   Insomnia 10/30/2016   Constipation 07/25/2016   S/P CABG x 4 07/06/2016   Bradycardia    CAD (coronary artery disease)    Carotid stenosis    CAD in native artery 06/29/2016   Elevated troponin 06/28/2016   Essential hypertension, malignant 06/28/2016   Mild tobacco abuse in early remission 06/28/2016   Essential hypertension    Malignant hypertension    Type 2 diabetes mellitus with other specified complication ([place])    Chest pain with high risk for cardiac etiology 06/27/2016   NSTEMI (non-ST elevated myocardial infarction) ([place]) 06/27/2016   Proteinuria 03/15/2016   Renal artery stenosis ([place]) 03/15/2016   MDD (major depressive disorder) 10/17/2015   Agoraphobia with panic attacks 04/25/2015   HTN (hypertension), malignant 10/20/2013   Cluster headache 03/20/2012   HLD (hyperlipidemia) 07/26/2010   ADJUSTMENT DISORDER WITH MIXED FEATURES 07/26/2010    Harrel Carina, MS, OTR/L 09/18/2021, 5:40 PM  [place] MAIN [facility] SERVICES [place], [place], [address] Phone: [phone]   Fax:  [phone]  Name: DEZAREE TRACEY MRN: [id number] Date of Birth: January 30, 1971

## 2021-09-19 NOTE — Therapy (Signed)
[employer] MAIN [facility] SERVICES [place], [place], [address] Phone: [phone]   Fax:  [phone]  Physical Therapy Treatment/ Physical Therapy Progress Note   Dates of reporting period  08/03/21   to   09/18/21   Patient Details  Name: Erika [name] MRN: [id number] Date of Birth: 1971/01/31 Referring Provider (PT): Leeroy Cha MD   Encounter Date: 09/18/2021   PT End of Session - 09/18/21 1618     Visit Number 10    Number of Visits 20    Date for PT Re-Evaluation 09/14/21    Authorization Type [employer] max visits 20x per year speech, OT, PT    Authorization Time Period 08/03/21-10/26/21    Progress Note Due on Visit 20    Equipment Utilized During Treatment Gait belt;Other (comment)   L AFO   Activity Tolerance Patient tolerated treatment well    Behavior During Therapy [facility] for tasks assessed/performed;Flat affect;Impulsive             Past Medical History:  Diagnosis Date   Arthralgia of temporomandibular joint    CAD, multiple vessel    a. 06/2016 Cath: ostLM 40%, ostLAD 40%, pLAD 95%, ost-pLCx 60%, pLCx 95%, mLCx 60%, mRCA 95%, D2 50%, LVSF nl;  b. 07/2016 CABG x 4 (LIMA->LAD, VG->Diag, VG->OM, VG->RCA); c. 08/2016 Cath: 3VD w/ 4/4 patent grafts. LAD distal to LIMA has diff dzs->Med rx; d. 08/2020 Cath: 4/4 patent grafts, native 3VD. EF 55-65%-->Med Rx.   Carotid arterial disease ([place])    a. 07/2016 s/p R CEA; b. 02/2021 U/S: RICA 62-37%, LICA 6-28%.   Clotting disorder ([place])    Depression    Diastolic dysfunction    a. 06/2016 Echo: EF 50-55%, mild inf wall HK, GR1DD, mild MR, RV sys fxn nl, mildly dilated LA, PASP nl   Fatty liver disease, nonalcoholic 3151   History of blood transfusion    with heart surgery   HLD (hyperlipidemia)    Labile hypertension    a. prior renal ngiogram negative for RAS in 03/2016; b. catecholamines and metanephrines normal, mildly elevated renin with normal aldosterone and normal  ratio in 02/2016   Myocardial infarction [facility]) 2017   Obesity    PAD (peripheral artery disease) ([place])    a. 09/2018 s/p L SFA stenting; b. 07/2019 Periph Angio: Patent m/d L SFA stent w/ 100% L SFA distal to stent. L AT 100d, L Peroneal diff dzs-->Med Rx; c. 02/2021 ABIs: stable @ 0.61 on R and 0.46 on L.   PTSD (post-traumatic stress disorder)    Tobacco abuse    Type 2 diabetes mellitus ([place]) 12/2015    Past Surgical History:  Procedure Laterality Date   ABDOMINAL AORTOGRAM W/LOWER EXTREMITY [name] 10/15/2018   Procedure: ABDOMINAL AORTOGRAM W/LOWER EXTREMITY;  Surgeon: Wellington Hampshire, MD;  Location: [place] [facility];  Service: Cardiovascular;  Laterality: [name];   ABDOMINAL AORTOGRAM W/LOWER EXTREMITY Bilateral 08/19/2019   Procedure: ABDOMINAL AORTOGRAM W/LOWER EXTREMITY;  Surgeon: Wellington Hampshire, MD;  Location: [place] [facility];  Service: Cardiovascular;  Laterality: Bilateral;   CARDIAC CATHETERIZATION [name] 06/29/2016   Procedure: Left Heart Cath and Coronary Angiography;  Surgeon: Minna Merritts, MD;  Location: [place] [facility];  Service: Cardiovascular;  Laterality: [name];   CARDIAC CATHETERIZATION [name] 08/29/2016   Procedure: Left Heart Cath and Cors/Grafts Angiography;  Surgeon: Wellington Hampshire, MD;  Location: [place] [facility];  Service: Cardiovascular;  Laterality: [name];   CESAREAN SECTION  CHOLECYSTECTOMY     CORONARY ARTERY BYPASS GRAFT [name] 07/06/2016   Procedure: CORONARY ARTERY BYPASS GRAFTING (CABG) x four, using left internal mammary artery and right leg greater saphenous vein harvested endoscopically;  Surgeon: Ivin Poot, MD;  Location: [place];  Service: Open Heart Surgery;  Laterality: [name];   ENDARTERECTOMY Right 07/06/2016   Procedure: ENDARTERECTOMY CAROTID;  Surgeon: Rosetta Posner, MD;  Location: [facility] OR;  Service: Vascular;  Laterality: Right;   ENDARTERECTOMY Right 04/27/2020   Procedure: REDO OF RIGHT ENDARTERECTOMY CAROTID;  Surgeon: Rosetta Posner, MD;   Location: [facility] OR;  Service: Vascular;  Laterality: Right;   LEFT HEART CATH AND CORS/GRAFTS ANGIOGRAPHY [name] 08/24/2020   Procedure: LEFT HEART CATH AND CORS/GRAFTS ANGIOGRAPHY;  Surgeon: Wellington Hampshire, MD;  Location: [place] [facility];  Service: Cardiovascular;  Laterality: [name];   PERIPHERAL VASCULAR CATHETERIZATION [name] 04/18/2016   Procedure: Renal Angiography;  Surgeon: Wellington Hampshire, MD;  Location: [place] [facility];  Service: Cardiovascular;  Laterality: [name];   PERIPHERAL VASCULAR INTERVENTION Left 10/15/2018   Procedure: PERIPHERAL VASCULAR INTERVENTION;  Surgeon: Wellington Hampshire, MD;  Location: [place] [facility];  Service: Cardiovascular;  Laterality: Left;  Left superficial femoral   TEE WITHOUT CARDIOVERSION [name] 07/06/2016   Procedure: TRANSESOPHAGEAL ECHOCARDIOGRAM (TEE);  Surgeon: Ivin Poot, MD;  Location: [place];  Service: Open Heart Surgery;  Laterality: [name];   TONSILLECTOMY      There were no vitals filed for this visit.   Subjective Assessment - 09/18/21 1607     Subjective Pt reports no new falls or changes since the last visit. Pt reports she feels she is doing better but still has a long way to go prior to full recovery. She continues to report frustrations with dependency on her family for caregiving as she used to be able to do all of her ADLs prior to her stroke. Pt was encouraged to continue to be patient with progress and allow her family to give care in order to prevent falls.    Pertinent History Pt presented to [facility] 06/24/21 c c/c of slurred speech, L facial droop & LUE weakness. MRI revealed acute R pontine perforator infarct with chronic R parietal & occipital infarcts. PMH: HTN, HLD, DM, tobacco abuse, PAD, CAD, CABG, dCHF, peptic foot drop, renal artery stenosis, MI and CAD (s/p of R CEA 2017), PTSD, adjustment disorder, obesity, Left hip OA s/p OPPT 2020. Pt DC 06/28/21 to inpatient rehab in [place], [place] to home 08/01/21. AT CIR pt worked extensively on STS with  MinA or Supervision and HW, Stand and squat pivot transfers with MinA, AMB progressed to ~49ft max, mostly with HW (some RW use with fixed LUE on handle), but was mostly limited in further progression of distance 2/2 chronic PAD ischaemic pain or Left knee buckling despite AFO use. CIR recommending update to more supporting AFO at transfer to OPPT. Since home, pt is using WC to navigate the environment, family assists with ADL performance, WC still needs to be modified to fit through BR doorframe. Pt reports not feeling safe with HW at present. Husband has been helping with gnelte stretching in mornings as pt sleeps in a flexion synergy posture on LLE.    Limitations Walking;Standing;House hold activities    How long can you sit comfortably? Generally well tolerated with occasional buttocks soreness    How long can you stand comfortably? At eval: tolerates <2 minutes prior to ischemic leg pain    How  long can you walk comfortably? Longest distance at CIR last month ~70f.    Patient Stated Goals Return indpendent ambulation    Currently in Pain? No/denies    Pain Orientation Left                [facility] PT Assessment - 09/19/21 0001       Observation/Other Assessments   Focus on Therapeutic Outcomes (FOTO)  53      Transfers   Five time sit to stand comments  44.45 sec      Standardized Balance Assessment   Standardized Balance Assessment Timed Up and Go Test;Five Times Sit to Stand      Timed Up and Go Test   Normal TUG (seconds) 100.59            Treatment provided this session  Therex:   2x10 ball kicks with physical therapist rolling multi patient in order to improved left lower extremity quad activation. 2x10 seated hamstring curls with towel under left lower extremity in order to improve left lower extremity strength.       Neuro Re- Ed, therapeutic activities:   Many physical therapy interventions today consisted of retesting standardized and lower extremity  strength assessments, further details regarding this may be found in flowsheet as well as goals section of this exam.   Unless otherwise stated, CGA was provided and gait belt donned in order to ensure pt safety  Pt required occasional rest breaks due fatigue, PT was quick to ask when pt appeared to be fatiguing in order to prevent excessive fatigue.       Pt educated throughout session about proper posture and technique with exercises. Improved exercise technique, movement at target joints, use of target muscles after min to mod verbal, visual, tactile cues.      Note: Portions of this document were prepared using Dragon voice recognition software and although reviewed may contain unintentional dictation errors in syntax, grammar, or spelling.                        PT Education - 09/18/21 1617     Education Details exercise technique, body mechanics    Person(s) Educated Patient    Methods Explanation;Demonstration;Tactile cues    Comprehension Verbalized understanding;Returned demonstration;Need further instruction              PT Short Term Goals - 09/18/21 1623       PT SHORT TERM GOAL #1   Title Pt to demonstrate improved tolerance to standing >3 minutes without physical assist or LOB. DME ad lib    Baseline <2 minutes at eval    Time 4    Period Weeks    Status Achieved    Target Date 08/31/21      PT SHORT TERM GOAL #2   Title Pt to demonstrate perfromance of 5xSTS c AFO/Hemi walker at supervision level independence without LOB or physical assist.    Baseline Needs minGuard to MinA to perform at eval, CGA level, not safe at supervision level at this point, 44.55 sec    Time 4    Period Weeks    Status On-going    Target Date 08/31/21      PT SHORT TERM GOAL #3   Title Pt to demonstrate AMB ~140fat supervision level with AFO, HW without LOB.    Baseline Not met, CGA required for safety    Time 6    Period Weeks  Status On-going     Target Date 09/14/21      PT SHORT TERM GOAL #4   Title Pt to demonstrate TUG test <120sec c HW and AFO at minguard - MinA level to improve independence with transfers and reduce falls risk at home.    Baseline No tug performed at eval, 100.59 sec with CGA    Time 7    Period Weeks    Status Achieved    Target Date 09/21/21               PT Long Term Goals - 09/18/21 1634       PT LONG TERM GOAL #1   Title Pt to demonstrate perfromance of 5xSTS c AFO/Hemi walker at supervision level independence without LOB or physical assist in less than 20sec.    Baseline Unable at eval, requires minguard to minA, PN 9/26: 44.5 sec with CGA for sfety    Time 8    Period Weeks    Status On-going    Target Date 11/14/21      PT LONG TERM GOAL #2   Title Pt to demonstrate FOTO score improvement >10 points to demonstrate improved perception of ability in ADL    Baseline 23 at eval:53 at PN, revised to increase by additional 10    Time 8    Period Weeks    Status Revised    Target Date 11/14/21      PT LONG TERM GOAL #3   Title Pt able to demonstrate stand pivot transfer with HW and AFO without LOB at supervision level to modI.    Baseline SBA for safety    Time 8    Period Weeks    Status On-going    Target Date 11/14/21      PT LONG TERM GOAL #4   Title Pt to demonstrate TUG test <60sec c HW and AFO at supervision level to improve independence with transfers and reduce falls risk at home.    Baseline Tug in 100.59 sec    Time 8    Period Weeks    Status New    Target Date 11/14/21                   Plan - 09/19/21 0809     Clinical Impression Statement Patient is a 50 year old female who presents to therapy following 9 physical therapy visits for her progress note on the 10th visit.  Patient has made significant progress with ability to transition from sit to stand and transfer from mat table to wheelchair.  Patient is still having falls within her home on a regular  basis, patient relates these falls to self-reported impulsiveness and lack of safety awareness within her home as well as lack of strength in her left lower extremity.  Patient does report since having her new AFO she feels her left knee is a little more steady but still has some buckling occasionally.  Patient demonstrates improved lower extremity strength and power as demonstrated by improved 5 times sit to stand test.  Patient also demonstrates improved efficacy with transitioning from sit to stand walking and turning as demonstrated by improved efficacy with timed up and go test.  Patient also has improved self-reported abilities as demonstrated by improved F OTO score.  Patient will continue to benefit from skilled physical therapy intervention in order to improve her fall risk, improve her lower extremity strength, improve her balance, improve her ability to transfer safely without risk of  falling, and to improve her overall quality of life and ability to complete ADLs with more independence. Patient's condition has the potential to improve in response to therapy. Maximum improvement is yet to be obtained. The anticipated improvement is attainable and reasonable in a generally predictable time.      Personal Factors and Comorbidities Age;Comorbidity 3+;Fitness;Past/Current Experience;Time since onset of injury/illness/exacerbation    Comorbidities DM, HTN, PVD, clotting disorder, CAD    Examination-Activity Limitations Squat;Stairs;Bathing;Bed Mobility;Dressing;Reach Overhead;Hygiene/Grooming;Bend;Carry;Locomotion Level;Transfers;Toileting;Stand    Examination-Participation Restrictions Cleaning;Community Activity;Occupation;Laundry;Driving;Yard Work    [employer] Evolving/Moderate complexity    Rehab Potential Good    PT Frequency 2x / week    PT Duration 12 weeks    PT Treatment/Interventions Electrical Stimulation;Gait training;Stair training;Functional mobility  training;Therapeutic activities;Therapeutic exercise;Balance training;Neuromuscular re-education;Patient/family education;Manual techniques;Wheelchair mobility training;Energy conservation;Passive range of motion;Orthotic Fit/Training;DME Instruction;ADLs/Self Care Home Management    PT Next Visit Plan work toward supervision level STS and SPT c HW, sustained standing tolerance.    PT Home Exercise Plan Not updated this visit- Encouraged seated LE strengthening and practicing gait with husband.;  No updates    Consulted and Agree with Plan of Care Patient;Family member/caregiver    Family Member Consulted husband             Patient will benefit from skilled therapeutic intervention in order to improve the following deficits and impairments:  Decreased endurance, Decreased mobility, Difficulty walking, Hypomobility, Decreased balance, Abnormal gait, Increased muscle spasms, Cardiopulmonary status limiting activity, Impaired flexibility, Hypermobility, Decreased strength, Decreased activity tolerance  Visit Diagnosis: Muscle weakness (generalized)  Other abnormalities of gait and mobility  Other lack of coordination  Unsteadiness on feet  Difficulty in walking, not elsewhere classified  Abnormality of gait and mobility     Problem List Patient Active Problem List   Diagnosis Date Noted   Cerebrovascular accident (CVA) due to occlusion of vertebral artery ([place]) 09/11/2021   Cognitive change 09/11/2021   Cognitive communication deficit 08/10/2021   Urinary incontinence 08/10/2021   GERD (gastroesophageal reflux disease) 08/03/2021   Chronic post-traumatic stress disorder (PTSD)    Depression with anxiety    Right pontine stroke ([place]) 06/28/2021   Hemiplegia and hemiparesis following cerebral infarction affecting left non-dominant side ([place]) 06/24/2021   Chronic diastolic CHF (congestive heart failure) ([place]) 06/24/2021   Fall 06/24/2021   Abnormal LFTs 06/24/2021   Olecranon  bursitis of left elbow 06/12/2021   Chronic hip pain (Bilateral) 06/09/2021   Chronic use of opiate for therapeutic purpose 05/09/2021   Greater trochanteric bursitis of hip (Left) 03/09/2021   Bursitis of hip (Left) 02/72/5366   Uncomplicated opioid dependence ([place]) 02/20/2021   Acute conjunctivitis of left eye 01/02/2021   Unintentional weight loss 11/21/2020   Broken teeth (Right) 08/02/2020   History of MI (myocardial infarction) (July 2017) 08/02/2020   Neuropathy 06/09/2020   Dental abscess 06/09/2020   Foot drop (Left) 06/09/2020   Carotid stenosis, asymptomatic, right 04/27/2020   Chronic migraine 11/03/2019   Thrombocytosis 09/16/2019   Erythrocytosis 09/16/2019   Hypercalcemia 09/06/2019   Leukocytosis 09/06/2019   Polycythemia 09/06/2019   Chronic hip pain (Left) 08/27/2019   Osteoarthritis of hip (Left) 08/27/2019   Gluteal tendonitis of buttock (Left) 08/27/2019   Radial nerve palsy (Right) 07/15/2019   Neuropathy of radial nerve (Right) 06/30/2019   Abnormal bruising 06/25/2019   History of carotid endarterectomy (Right) 03/04/2019   Pain medication agreement signed 03/04/2019   Atypical facial pain (Right) 01/27/2019   Chronic ear pain (Right)  01/27/2019   Chronic jaw pain (Right) 01/27/2019   Geniculate Neuralgia (Right) 01/27/2019   Vitamin D deficiency 01/19/2019   Neurogenic pain 01/19/2019   Chronic anticoagulation (PLAVIX) 01/19/2019   Chronic pain syndrome 01/07/2019   Long term current use of opiate analgesic 01/07/2019   Long term prescription benzodiazepine use 01/07/2019   Pharmacologic therapy 01/07/2019   Disorder of skeletal system 01/07/2019   Problems influencing health status 01/07/2019   Chronic headaches (1ry area of Pain) (Right) 01/07/2019   Opiate use 09/24/2018   PVD (peripheral vascular disease) ([place]) 06/24/2018   Chronic ankle pain (Bilateral) 12/27/2017   Tendinopathy of gluteus medius (Right) 12/27/2017   Tendinopathy of gluteus  medius (Left) 12/27/2017   Bilateral hip pain 12/26/2017   Chronic elbow pain (Left) 10/17/2017   Elevated troponin I level 05/21/2017   Major depressive disorder, recurrent episode, moderate (HCC) 11/19/2016   Insomnia 10/30/2016   Constipation 07/25/2016   S/P CABG x 4 07/06/2016   Bradycardia    CAD (coronary artery disease)    Carotid stenosis    CAD in native artery 06/29/2016   Elevated troponin 06/28/2016   Essential hypertension, malignant 06/28/2016   Mild tobacco abuse in early remission 06/28/2016   Essential hypertension    Malignant hypertension    Type 2 diabetes mellitus with other specified complication ([place])    Chest pain with high risk for cardiac etiology 06/27/2016   NSTEMI (non-ST elevated myocardial infarction) ([place]) 06/27/2016   Proteinuria 03/15/2016   Renal artery stenosis ([place]) 03/15/2016   MDD (major depressive disorder) 10/17/2015   Agoraphobia with panic attacks 04/25/2015   HTN (hypertension), malignant 10/20/2013   Cluster headache 03/20/2012   HLD (hyperlipidemia) 07/26/2010   ADJUSTMENT DISORDER WITH MIXED FEATURES 07/26/2010    Erika [name], PT 09/19/2021, 9:24 AM  [place], [place], [address] Phone: [phone]   Fax:  [phone]  Name: Erika [name] MRN: [id number] Date of Birth: May 18, 1971

## 2021-09-20 ENCOUNTER — Ambulatory Visit: Payer: Managed Care, Other (non HMO) | Admitting: Speech Pathology

## 2021-09-20 ENCOUNTER — Ambulatory Visit: Payer: Managed Care, Other (non HMO)

## 2021-09-20 ENCOUNTER — Ambulatory Visit: Payer: Managed Care, Other (non HMO) | Admitting: Occupational Therapy

## 2021-09-21 ENCOUNTER — Ambulatory Visit: Payer: Managed Care, Other (non HMO)

## 2021-09-21 ENCOUNTER — Other Ambulatory Visit: Payer: Self-pay

## 2021-09-21 DIAGNOSIS — R41841 Cognitive communication deficit: Secondary | ICD-10-CM | POA: Diagnosis not present

## 2021-09-21 DIAGNOSIS — R269 Unspecified abnormalities of gait and mobility: Secondary | ICD-10-CM

## 2021-09-21 DIAGNOSIS — R2689 Other abnormalities of gait and mobility: Secondary | ICD-10-CM

## 2021-09-21 DIAGNOSIS — R278 Other lack of coordination: Secondary | ICD-10-CM

## 2021-09-21 DIAGNOSIS — M6281 Muscle weakness (generalized): Secondary | ICD-10-CM

## 2021-09-21 DIAGNOSIS — R262 Difficulty in walking, not elsewhere classified: Secondary | ICD-10-CM

## 2021-09-21 DIAGNOSIS — I69354 Hemiplegia and hemiparesis following cerebral infarction affecting left non-dominant side: Secondary | ICD-10-CM

## 2021-09-21 DIAGNOSIS — R2681 Unsteadiness on feet: Secondary | ICD-10-CM

## 2021-09-21 NOTE — Therapy (Signed)
[place] MAIN [facility] SERVICES [address] [place], [place], [address] Phone: [phone]   Fax:  [phone]  Occupational Therapy Treatment  Patient Details  Name: Erika [name] MRN: [id number] Date of Birth: 03/04/1971 Referring Provider (OT): Dr. Leeroy Cha   Encounter Date: 09/21/2021   OT End of Session - 09/21/21 1714     Visit Number 11    Number of Visits 20    Date for OT Re-Evaluation 10/25/21    Authorization Type limited to 20 visits combined/multidiscipline    Authorization Time Period Reporting period starting 08/03/2021    OT Start Time 1300    OT Stop Time 1345    OT Time Calculation (min) 45 min    Equipment Utilized During Treatment wc    Activity Tolerance Patient limited by lethargy    Behavior During Therapy Flat affect             Past Medical History:  Diagnosis Date   Arthralgia of temporomandibular joint    CAD, multiple vessel    a. 06/2016 Cath: ostLM 40%, ostLAD 40%, pLAD 95%, ost-pLCx 60%, pLCx 95%, mLCx 60%, mRCA 95%, D2 50%, LVSF nl;  b. 07/2016 CABG x 4 (LIMA->LAD, VG->Diag, VG->OM, VG->RCA); c. 08/2016 Cath: 3VD w/ 4/4 patent grafts. LAD distal to LIMA has diff dzs->Med rx; d. 08/2020 Cath: 4/4 patent grafts, native 3VD. EF 55-65%-->Med Rx.   Carotid arterial disease ([place])    a. 07/2016 s/p R CEA; b. 02/2021 U/S: RICA 75-91%, LICA 6-38%.   Clotting disorder ([place])    Depression    Diastolic dysfunction    a. 06/2016 Echo: EF 50-55%, mild inf wall HK, GR1DD, mild MR, RV sys fxn nl, mildly dilated LA, PASP nl   Fatty liver disease, nonalcoholic 4665   History of blood transfusion    with heart surgery   HLD (hyperlipidemia)    Labile hypertension    a. prior renal ngiogram negative for RAS in 03/2016; b. catecholamines and metanephrines normal, mildly elevated renin with normal aldosterone and normal ratio in 02/2016   Myocardial infarction [facility]) 2017   Obesity    PAD (peripheral artery  disease) ([place])    a. 09/2018 s/p L SFA stenting; b. 07/2019 Periph Angio: Patent m/d L SFA stent w/ 100% L SFA distal to stent. L AT 100d, L Peroneal diff dzs-->Med Rx; c. 02/2021 ABIs: stable @ 0.61 on R and 0.46 on L.   PTSD (post-traumatic stress disorder)    Tobacco abuse    Type 2 diabetes mellitus ([place]) 12/2015    Past Surgical History:  Procedure Laterality Date   ABDOMINAL AORTOGRAM W/LOWER EXTREMITY [name] 10/15/2018   Procedure: ABDOMINAL AORTOGRAM W/LOWER EXTREMITY;  Surgeon: Wellington Hampshire, MD;  Location: [place] CV LAB;  Service: Cardiovascular;  Laterality: [name];   ABDOMINAL AORTOGRAM W/LOWER EXTREMITY Bilateral 08/19/2019   Procedure: ABDOMINAL AORTOGRAM W/LOWER EXTREMITY;  Surgeon: Wellington Hampshire, MD;  Location: [place] CV LAB;  Service: Cardiovascular;  Laterality: Bilateral;   CARDIAC CATHETERIZATION [name] 06/29/2016   Procedure: Left Heart Cath and Coronary Angiography;  Surgeon: Minna Merritts, MD;  Location: [place] CV LAB;  Service: Cardiovascular;  Laterality: [name];   CARDIAC CATHETERIZATION [name] 08/29/2016   Procedure: Left Heart Cath and Cors/Grafts Angiography;  Surgeon: Wellington Hampshire, MD;  Location: [place] CV LAB;  Service: Cardiovascular;  Laterality: [name];   CESAREAN SECTION     CHOLECYSTECTOMY     CORONARY ARTERY BYPASS GRAFT [name]  07/06/2016   Procedure: CORONARY ARTERY BYPASS GRAFTING (CABG) x four, using left internal mammary artery and right leg greater saphenous vein harvested endoscopically;  Surgeon: Ivin Poot, MD;  Location: [place];  Service: Open Heart Surgery;  Laterality: [name];   ENDARTERECTOMY Right 07/06/2016   Procedure: ENDARTERECTOMY CAROTID;  Surgeon: Rosetta Posner, MD;  Location: [facility] OR;  Service: Vascular;  Laterality: Right;   ENDARTERECTOMY Right 04/27/2020   Procedure: REDO OF RIGHT ENDARTERECTOMY CAROTID;  Surgeon: Rosetta Posner, MD;  Location: [facility] OR;  Service: Vascular;  Laterality: Right;   LEFT HEART CATH AND CORS/GRAFTS  ANGIOGRAPHY [name] 08/24/2020   Procedure: LEFT HEART CATH AND CORS/GRAFTS ANGIOGRAPHY;  Surgeon: Wellington Hampshire, MD;  Location: [place] CV LAB;  Service: Cardiovascular;  Laterality: [name];   PERIPHERAL VASCULAR CATHETERIZATION [name] 04/18/2016   Procedure: Renal Angiography;  Surgeon: Wellington Hampshire, MD;  Location: [place] CV LAB;  Service: Cardiovascular;  Laterality: [name];   PERIPHERAL VASCULAR INTERVENTION Left 10/15/2018   Procedure: PERIPHERAL VASCULAR INTERVENTION;  Surgeon: Wellington Hampshire, MD;  Location: [place] CV LAB;  Service: Cardiovascular;  Laterality: Left;  Left superficial femoral   TEE WITHOUT CARDIOVERSION [name] 07/06/2016   Procedure: TRANSESOPHAGEAL ECHOCARDIOGRAM (TEE);  Surgeon: Ivin Poot, MD;  Location: [place];  Service: Open Heart Surgery;  Laterality: [name];   TONSILLECTOMY      There were no vitals filed for this visit.   Subjective Assessment - 09/21/21 1711     Subjective  Spouse reports pt's legs and L arm were weaker this morning, and pt is still having intermittent delirium.    Patient is accompanied by: Family member    Pertinent History HX of CAD, CABG, DM2, s/p R pontine stroke    Limitations L sided hemiplegia, impulsivity, judgment, safety awareness    Patient Stated Goals "I want to be able to take care of myself."    Currently in Pain? Yes    Pain Score 2     Pain Location Shoulder    Pain Orientation Left    Pain Descriptors / Indicators Aching;Sore    Pain Type Chronic pain    Pain Onset More than a month ago    Pain Frequency Intermittent    Aggravating Factors  unable to sleep on L side    Pain Relieving Factors pain meds, positioning    Multiple Pain Sites No           Occupational Therapy Treatment: Neuro re-ed: Assisted pt to supine for LUE PROM shoulder, elbow, wrist, digits all planes with good tolerance.  Minimal pain noted at L shoulder 90 degrees flexion.  Provided facilitory tapping for L wrist extensors and bicep  flexion with slight contraction noted.  Attempted AAROM for L shoulder all planes without noted muscle contraction.  Pt with minimal L shoulder shrug in supine or sitting.    Self Care: Encouraged pt use L arm trough when up in wc to support LUE; did not have today.  Also encouraged elevated pillow wedge in tray for L hand edema management.  Demonstrated wedging pillow between pt and wc arm rest for supporting LUE when arm trough is not present.  Encouraged pt keep extra pillow in the car to use when needed for community outings to maintain support of LUE.  Pt receptive.    Response to Treatment: Pt with increased lethargy this day and LUE noted to be flaccid, which is a change from last visit.  Spoke with spouse  who reports that he noticed this as well and spouse also reported that pt's legs were more weak this morning.  He verbalized that pt is finishing an antibiotic this week to treat a UTI, but that pt has been intermittently delirious over the last several weeks, and as recent as on their way to therapy today in the car, per spouse.  OT notified neurologist Dr. Ranell Patrick of above information and changes, and MD reports pt has had a recent CT which was negative for new stroke, as well as blood work and Xray, none of which were able to pinpoint cause of delirium.  Dr. Ranell Patrick reports that pt is on a number of sedating drugs from which they are trying to wean, and appreciated OT follow up.  OT relayed this conversation to spouse and OT also encouraged spouse take pt to ED if he felt any symptoms were worsening.  Spouse verbalized understanding.      OT Education - 09/21/21 1714     Education Details LUE positioning with use of arm trough or pillow when up in wc    Person(s) Educated Patient    Methods Explanation;Demonstration;Verbal cues    Comprehension Verbalized understanding;Returned demonstration;Need further instruction;Verbal cues required              OT Short Term Goals - 09/18/21  1542       OT SHORT TERM GOAL #1   Title Pt will be perform HEP for LUE with min vc.    Baseline 10th visit: Pt. requires assist to perform Self-ROM. Eval: Initiated at eval, further instruction needed    Time 6    Period Weeks    Status On-going               OT Long Term Goals - 09/18/21 1536       OT LONG TERM GOAL #2   Title Pt will perform UB ADLs with set up    Baseline 10th vist: independent doffing a shirt, MinA donning    Time 12    Period Weeks    Status On-going    Target Date 10/25/21      OT LONG TERM GOAL #3   Title Pt will perform LB ADLs with min A    Baseline 10th visit: Pt. is able to donn pants with minA hiking the back waistband. Pt. requires maxA donning shoes, ans socks. Eval: Max A to perform LB ADLs    Time 12    Period Weeks    Status On-going    Target Date 10/25/21      OT LONG TERM GOAL #4   Title Pt will perform ADL transfers with distant supv    Baseline 10th visit: CGA. Eval: min A-mod A to perform ADL transfers    Time 12    Period Weeks    Status On-going    Target Date 10/25/21      OT LONG TERM GOAL #5   Title Pt will increase LUE strength by 2 MM grades to work towards use of LUE as a stabilizer for self care.    Baseline 10th visit: Pt. is starting to engage her left hand as a stabilizer.: L shoulder 1/5, elbow/wrist/hand 0/5.  Unable to use LUE as a stabilzer for self care.    Time 12    Period Weeks    Status On-going    Target Date 10/25/21  Plan - 09/21/21 1729     Clinical Impression Statement Pt with increased lethargy this day and LUE noted to be flaccid, which is a change from last visit.  Spoke with spouse who reports that he noticed this as well and spouse also reported that pt's legs were more weak this morning.  He verbalized that pt is finishing an antibiotic this week to treat a UTI, but that pt has been intermittently delirious over the last several weeks, and as recent as on their  way to therapy today in the car, per spouse.  OT notified neurologist Dr. Ranell Patrick of above information and changes, and MD reports pt has had a recent CT which was negative for new stroke, as well as blood work and Xray, none of which were able to pinpoint cause of delirium.  Dr. Ranell Patrick reports that pt is on a number of sedating drugs from which they are trying to wean, and appreciated OT follow up.  OT relayed this conversation to spouse and OT also encouraged spouse take pt to ED if he felt any symptoms were worsening.  Spouse verbalized understanding.    OT Occupational Profile and History Detailed Assessment- Review of Records and additional review of physical, cognitive, psychosocial history related to current functional performance    Occupational performance deficits (Please refer to evaluation for details): ADL's;IADL's;Leisure;Work    Marketing executive / Function / Physical Skills ADL;Continence;Dexterity;Strength;Vision;Balance;Coordination;FMC;IADL;Body mechanics;Endurance;Gait;UE functional use;Decreased knowledge of use of DME;GMC;Mobility    Rehab Potential Good    Clinical Decision Making Several treatment options, min-mod task modification necessary    Comorbidities Affecting Occupational Performance: May have comorbidities impacting occupational performance    Modification or Assistance to Complete Evaluation  Min-Moderate modification of tasks or assist with assess necessary to complete eval    OT Frequency 2x / week    OT Duration 12 weeks    OT Treatment/Interventions Self-care/ADL training;Therapeutic exercise;DME and/or AE instruction;Functional Mobility Training;Balance training;Electrical Stimulation;Neuromuscular education;Manual Therapy;Splinting;Visual/perceptual remediation/compensation;Moist Heat;Passive range of motion;Therapeutic activities;Patient/family education    Consulted and Agree with Plan of Care Patient;Family member/caregiver    Family Member Consulted spouse, [name]              Patient will benefit from skilled therapeutic intervention in order to improve the following deficits and impairments:   Body Structure / Function / Physical Skills: ADL, Continence, Dexterity, Strength, Vision, Balance, Coordination, FMC, IADL, Body mechanics, Endurance, Gait, UE functional use, Decreased knowledge of use of DME, [place], Mobility       Visit Diagnosis: Muscle weakness (generalized)  Other lack of coordination  Hemiplegia and hemiparesis following cerebral infarction affecting left non-dominant side [facility])    Problem List Patient Active Problem List   Diagnosis Date Noted   Cerebrovascular accident (CVA) due to occlusion of vertebral artery ([place]) 09/11/2021   Cognitive change 09/11/2021   Cognitive communication deficit 08/10/2021   Urinary incontinence 08/10/2021   GERD (gastroesophageal reflux disease) 08/03/2021   Chronic post-traumatic stress disorder (PTSD)    Depression with anxiety    Right pontine stroke ([place]) 06/28/2021   Hemiplegia and hemiparesis following cerebral infarction affecting left non-dominant side ([place]) 06/24/2021   Chronic diastolic CHF (congestive heart failure) ([place]) 06/24/2021   Fall 06/24/2021   Abnormal LFTs 06/24/2021   Olecranon bursitis of left elbow 06/12/2021   Chronic hip pain (Bilateral) 06/09/2021   Chronic use of opiate for therapeutic purpose 05/09/2021   Greater trochanteric bursitis of hip (Left) 03/09/2021   Bursitis of hip (Left) 02/23/2021  Uncomplicated opioid dependence ([place]) 02/20/2021   Acute conjunctivitis of left eye 01/02/2021   Unintentional weight loss 11/21/2020   Broken teeth (Right) 08/02/2020   History of MI (myocardial infarction) (July 2017) 08/02/2020   Neuropathy 06/09/2020   Dental abscess 06/09/2020   Foot drop (Left) 06/09/2020   Carotid stenosis, asymptomatic, right 04/27/2020   Chronic migraine 11/03/2019   Thrombocytosis 09/16/2019   Erythrocytosis 09/16/2019    Hypercalcemia 09/06/2019   Leukocytosis 09/06/2019   Polycythemia 09/06/2019   Chronic hip pain (Left) 08/27/2019   Osteoarthritis of hip (Left) 08/27/2019   Gluteal tendonitis of buttock (Left) 08/27/2019   Radial nerve palsy (Right) 07/15/2019   Neuropathy of radial nerve (Right) 06/30/2019   Abnormal bruising 06/25/2019   History of carotid endarterectomy (Right) 03/04/2019   Pain medication agreement signed 03/04/2019   Atypical facial pain (Right) 01/27/2019   Chronic ear pain (Right) 01/27/2019   Chronic jaw pain (Right) 01/27/2019   Geniculate Neuralgia (Right) 01/27/2019   Vitamin D deficiency 01/19/2019   Neurogenic pain 01/19/2019   Chronic anticoagulation (PLAVIX) 01/19/2019   Chronic pain syndrome 01/07/2019   Long term current use of opiate analgesic 01/07/2019   Long term prescription benzodiazepine use 01/07/2019   Pharmacologic therapy 01/07/2019   Disorder of skeletal system 01/07/2019   Problems influencing health status 01/07/2019   Chronic headaches (1ry area of Pain) (Right) 01/07/2019   Opiate use 09/24/2018   PVD (peripheral vascular disease) ([place]) 06/24/2018   Chronic ankle pain (Bilateral) 12/27/2017   Tendinopathy of gluteus medius (Right) 12/27/2017   Tendinopathy of gluteus medius (Left) 12/27/2017   Bilateral hip pain 12/26/2017   Chronic elbow pain (Left) 10/17/2017   Elevated troponin I level 05/21/2017   Major depressive disorder, recurrent episode, moderate (HCC) 11/19/2016   Insomnia 10/30/2016   Constipation 07/25/2016   S/P CABG x 4 07/06/2016   Bradycardia    CAD (coronary artery disease)    Carotid stenosis    CAD in native artery 06/29/2016   Elevated troponin 06/28/2016   Essential hypertension, malignant 06/28/2016   Mild tobacco abuse in early remission 06/28/2016   Essential hypertension    Malignant hypertension    Type 2 diabetes mellitus with other specified complication ([place])    Chest pain with high risk for cardiac  etiology 06/27/2016   NSTEMI (non-ST elevated myocardial infarction) ([place]) 06/27/2016   Proteinuria 03/15/2016   Renal artery stenosis ([place]) 03/15/2016   MDD (major depressive disorder) 10/17/2015   Agoraphobia with panic attacks 04/25/2015   HTN (hypertension), malignant 10/20/2013   Cluster headache 03/20/2012   HLD (hyperlipidemia) 07/26/2010   ADJUSTMENT DISORDER WITH MIXED FEATURES 07/26/2010   Leta Speller, MS, OTR/L  Darleene Cleaver, OT/L 09/21/2021, 5:29 PM  [place] MAIN [facility] SERVICES [place], [place], [address] Phone: [phone]   Fax:  [phone]  Name: Erika [name] MRN: [id number] Date of Birth: [birth year]

## 2021-09-21 NOTE — Patient Instructions (Signed)
OT advised spouse on taking pt to ED if he sees any worsening of symptoms.  Spouse verbalized understanding.

## 2021-09-21 NOTE — Therapy (Signed)
[place] MAIN [facility] SERVICES [address] [place], [place], [address] Phone: [phone]   Fax:  [phone]  Physical Therapy Treatment  Patient Details  Name: Erika [name] MRN: [id number] Date of Birth: 1971-08-15 Referring Provider (PT): Leeroy Cha MD   Encounter Date: 09/21/2021   PT End of Session - 09/21/21 1530     Visit Number 11    Number of Visits 20    Date for PT Re-Evaluation 09/14/21    Authorization Type [employer] max visits 20x per year speech, OT, PT    Authorization Time Period 08/03/21-10/26/21    Progress Note Due on Visit 20    PT Start Time 1350    PT Stop Time 1431    PT Time Calculation (min) 41 min    Equipment Utilized During Treatment Gait belt;Other (comment)   Left AFO   Activity Tolerance Patient tolerated treatment well;No increased pain;Patient limited by fatigue;Patient limited by pain    Behavior During Therapy [facility] for tasks assessed/performed;Impulsive             Past Medical History:  Diagnosis Date   Arthralgia of temporomandibular joint    CAD, multiple vessel    a. 06/2016 Cath: ostLM 40%, ostLAD 40%, pLAD 95%, ost-pLCx 60%, pLCx 95%, mLCx 60%, mRCA 95%, D2 50%, LVSF nl;  b. 07/2016 CABG x 4 (LIMA->LAD, VG->Diag, VG->OM, VG->RCA); c. 08/2016 Cath: 3VD w/ 4/4 patent grafts. LAD distal to LIMA has diff dzs->Med rx; d. 08/2020 Cath: 4/4 patent grafts, native 3VD. EF 55-65%-->Med Rx.   Carotid arterial disease ([place])    a. 07/2016 s/p R CEA; b. 02/2021 U/S: RICA 15-72%, LICA 6-20%.   Clotting disorder ([place])    Depression    Diastolic dysfunction    a. 06/2016 Echo: EF 50-55%, mild inf wall HK, GR1DD, mild MR, RV sys fxn nl, mildly dilated LA, PASP nl   Fatty liver disease, nonalcoholic 3559   History of blood transfusion    with heart surgery   HLD (hyperlipidemia)    Labile hypertension    a. prior renal ngiogram negative for RAS in 03/2016; b. catecholamines and metanephrines normal, mildly  elevated renin with normal aldosterone and normal ratio in 02/2016   Myocardial infarction [facility]) 2017   Obesity    PAD (peripheral artery disease) ([place])    a. 09/2018 s/p L SFA stenting; b. 07/2019 Periph Angio: Patent m/d L SFA stent w/ 100% L SFA distal to stent. L AT 100d, L Peroneal diff dzs-->Med Rx; c. 02/2021 ABIs: stable @ 0.61 on R and 0.46 on L.   PTSD (post-traumatic stress disorder)    Tobacco abuse    Type 2 diabetes mellitus ([place]) 12/2015    Past Surgical History:  Procedure Laterality Date   ABDOMINAL AORTOGRAM W/LOWER EXTREMITY [name] 10/15/2018   Procedure: ABDOMINAL AORTOGRAM W/LOWER EXTREMITY;  Surgeon: Wellington Hampshire, MD;  Location: [place] CV LAB;  Service: Cardiovascular;  Laterality: [name];   ABDOMINAL AORTOGRAM W/LOWER EXTREMITY Bilateral 08/19/2019   Procedure: ABDOMINAL AORTOGRAM W/LOWER EXTREMITY;  Surgeon: Wellington Hampshire, MD;  Location: [place] CV LAB;  Service: Cardiovascular;  Laterality: Bilateral;   CARDIAC CATHETERIZATION [name] 06/29/2016   Procedure: Left Heart Cath and Coronary Angiography;  Surgeon: Minna Merritts, MD;  Location: [place] CV LAB;  Service: Cardiovascular;  Laterality: [name];   CARDIAC CATHETERIZATION [name] 08/29/2016   Procedure: Left Heart Cath and Cors/Grafts Angiography;  Surgeon: Wellington Hampshire, MD;  Location: [place] INVASIVE CV  LAB;  Service: Cardiovascular;  Laterality: [name];   CESAREAN SECTION     CHOLECYSTECTOMY     CORONARY ARTERY BYPASS GRAFT [name] 07/06/2016   Procedure: CORONARY ARTERY BYPASS GRAFTING (CABG) x four, using left internal mammary artery and right leg greater saphenous vein harvested endoscopically;  Surgeon: Ivin Poot, MD;  Location: [place];  Service: Open Heart Surgery;  Laterality: [name];   ENDARTERECTOMY Right 07/06/2016   Procedure: ENDARTERECTOMY CAROTID;  Surgeon: Rosetta Posner, MD;  Location: [facility] OR;  Service: Vascular;  Laterality: Right;   ENDARTERECTOMY Right 04/27/2020   Procedure: REDO OF RIGHT  ENDARTERECTOMY CAROTID;  Surgeon: Rosetta Posner, MD;  Location: [facility] OR;  Service: Vascular;  Laterality: Right;   LEFT HEART CATH AND CORS/GRAFTS ANGIOGRAPHY [name] 08/24/2020   Procedure: LEFT HEART CATH AND CORS/GRAFTS ANGIOGRAPHY;  Surgeon: Wellington Hampshire, MD;  Location: [place] CV LAB;  Service: Cardiovascular;  Laterality: [name];   PERIPHERAL VASCULAR CATHETERIZATION [name] 04/18/2016   Procedure: Renal Angiography;  Surgeon: Wellington Hampshire, MD;  Location: [place] CV LAB;  Service: Cardiovascular;  Laterality: [name];   PERIPHERAL VASCULAR INTERVENTION Left 10/15/2018   Procedure: PERIPHERAL VASCULAR INTERVENTION;  Surgeon: Wellington Hampshire, MD;  Location: [place] CV LAB;  Service: Cardiovascular;  Laterality: Left;  Left superficial femoral   TEE WITHOUT CARDIOVERSION [name] 07/06/2016   Procedure: TRANSESOPHAGEAL ECHOCARDIOGRAM (TEE);  Surgeon: Ivin Poot, MD;  Location: [place];  Service: Open Heart Surgery;  Laterality: [name];   TONSILLECTOMY      There were no vitals filed for this visit.   Subjective Assessment - 09/21/21 1529     Subjective Pt doing well today in general, no medication updates or falls since prior visit. Pt still very much upset with her interaction with neurologist recently.    Pertinent History Pt [facility] to [facility] c c/c of slurred speech, L facial droop & LUE weakness. MRI revealed acute R pontine perforator infarct with chronic R parietal & occipital infarcts. PMH: HTN, HLD, DM, tobacco abuse, PAD, CAD, CABG, dCHF, peptic foot drop, renal artery stenosis, MI and CAD (s/p of R CEA 2017), PTSD, adjustment disorder, obesity, Left hip OA s/p OPPT 2020. Pt DC 06/28/21 to inpatient rehab in [place], [place] to home 08/01/21. AT CIR pt worked extensively on STS with MinA or Supervision and HW, Stand and squat pivot transfers with MinA, AMB progressed to ~29ft max, mostly with HW (some RW use with fixed LUE on handle), but was mostly limited in further progression of  distance 2/2 chronic PAD ischaemic pain or Left knee buckling despite AFO use. CIR recommending update to more supporting AFO at transfer to OPPT. Since home, pt is using WC to navigate the environment, family assists with ADL performance, WC still needs to be modified to fit through BR doorframe. Pt reports not feeling safe with HW at present. Husband has been helping with gnelte stretching in mornings as pt sleeps in a flexion synergy posture on LLE.    Currently in Pain? No/denies             INTERVENTION THIS DATE: -SPT WC to plinth, minA -sittingLeft EOB to supine with minA  -LLE hamstrings stretch (no hypertonicity); LLE quads stretch/SKTC stretch (no hypertonicity); FABER stretch (no hypertonicity) -hooklying bridges 2x6  -hooklying marching 3x5 Requires maxA facilitation  -LLE SAQ 2x10 Requires maxA facilitation  -ModA to EOB left, able to sit and balance independenly -SPT EOB to from arm chair with hemiwalker on RUE,  minGuard assist; 8x with <60sec rest each time  -AMB c RUE HW CW around high/low table;  -seated rest  -AMB c RUE HW CCW around high/low table;  -pivot transfer EOB to [facility]    PT Education - 09/21/21 1529     Education Details effective use of AFO for knee buckling assist    Person(s) Educated Patient    Methods Explanation;Demonstration    Comprehension Verbalized understanding;Returned demonstration              PT Short Term Goals - 09/18/21 1623       PT SHORT TERM GOAL #1   Title Pt to demonstrate improved tolerance to standing >3 minutes without physical assist or LOB. DME ad lib    Baseline <2 minutes at eval    Time 4    Period Weeks    Status Achieved    Target Date 08/31/21      PT SHORT TERM GOAL #2   Title Pt to demonstrate perfromance of 5xSTS c AFO/Hemi walker at supervision level independence without LOB or physical assist.    Baseline Needs minGuard to MinA to perform at eval, CGA level, not safe at supervision level at this point,  44.55 sec    Time 4    Period Weeks    Status On-going    Target Date 08/31/21      PT SHORT TERM GOAL #3   Title Pt to demonstrate AMB ~32ft at supervision level with AFO, HW without LOB.    Baseline Not met, CGA required for safety    Time 6    Period Weeks    Status On-going    Target Date 09/14/21      PT SHORT TERM GOAL #4   Title Pt to demonstrate TUG test <120sec c HW and AFO at minguard - MinA level to improve independence with transfers and reduce falls risk at home.    Baseline No tug performed at eval, 100.59 sec with CGA    Time 7    Period Weeks    Status Achieved    Target Date 09/21/21               PT Long Term Goals - 09/18/21 1634       PT LONG TERM GOAL #1   Title Pt to demonstrate perfromance of 5xSTS c AFO/Hemi walker at supervision level independence without LOB or physical assist in less than 20sec.    Baseline Unable at eval, requires minguard to minA, PN 9/26: 44.5 sec with CGA for sfety    Time 8    Period Weeks    Status On-going    Target Date 11/14/21      PT LONG TERM GOAL #2   Title Pt to demonstrate FOTO score improvement >10 points to demonstrate improved perception of ability in ADL    Baseline 23 at eval:53 at PN, revised to increase by additional 10    Time 8    Period Weeks    Status Revised    Target Date 11/14/21      PT LONG TERM GOAL #3   Title Pt able to demonstrate stand pivot transfer with HW and AFO without LOB at supervision level to modI.    Baseline SBA for safety    Time 8    Period Weeks    Status On-going    Target Date 11/14/21      PT LONG TERM GOAL #4   Title Pt to demonstrate TUG  test <60sec c HW and AFO at supervision level to improve independence with transfers and reduce falls risk at home.    Baseline Tug in 100.59 sec    Time 8    Period Weeks    Status New    Target Date 11/14/21                   Plan - 09/21/21 1531     Clinical Impression Statement Session focus on safety,  independence, fluency in stand pivot transfers due to conitnued falls at home. Pt also working on direction changes in multiple plane gait over short distances with HW. Attempts some LLE stretching, however, no signs of LLE hypertonicity today. Hooklying exercises with intermittent utility due to rapid onmset loss of motor activation with fatigue in hip flexors and knee extensors. Pt remains motivated to advance her capabilities as far as possible, but is aware of her impulsivity and need to change behaviior patterns to optimize safety. Pt will cotninue to benefit from skilled intervention to maximize safety and independence in basic mobility for ADL performance to improve quality of life.    Personal Factors and Comorbidities Age;Comorbidity 3+;Fitness;Past/Current Experience;Time since onset of injury/illness/exacerbation    Comorbidities DM, HTN, PVD, clotting disorder, CAD    Examination-Activity Limitations Squat;Stairs;Bathing;Bed Mobility;Dressing;Reach Overhead;Hygiene/Grooming;Bend;Carry;Locomotion Level;Transfers;Toileting;Stand    Examination-Participation Restrictions Cleaning;Community Activity;Occupation;Laundry;Driving;Yard Work    [employer] Evolving/Moderate complexity    Clinical Decision Making Moderate    Rehab Potential Good    PT Frequency 2x / week    PT Duration 12 weeks    PT Treatment/Interventions Electrical Stimulation;Gait training;Stair training;Functional mobility training;Therapeutic activities;Therapeutic exercise;Balance training;Neuromuscular re-education;Patient/family education;Manual techniques;Wheelchair mobility training;Energy conservation;Passive range of motion;Orthotic Fit/Training;DME Instruction;ADLs/Self Care Home Management    PT Next Visit Plan work toward supervision level STS and SPT c HW, sustained standing tolerance.    PT Home Exercise Plan Not updated this visit- Encouraged seated LE strengthening and practicing gait with  husband.;  No updates    Consulted and Agree with Plan of Care Patient;Family member/caregiver    Family Member Consulted husband             Patient will benefit from skilled therapeutic intervention in order to improve the following deficits and impairments:  Decreased endurance, Decreased mobility, Difficulty walking, Hypomobility, Decreased balance, Abnormal gait, Increased muscle spasms, Cardiopulmonary status limiting activity, Impaired flexibility, Hypermobility, Decreased strength, Decreased activity tolerance  Visit Diagnosis: Muscle weakness (generalized)  Other abnormalities of gait and mobility  Unsteadiness on feet  Difficulty in walking, not elsewhere classified  Abnormality of gait and mobility     Problem List Patient Active Problem List   Diagnosis Date Noted   Cerebrovascular accident (CVA) due to occlusion of vertebral artery ([place]) 09/11/2021   Cognitive change 09/11/2021   Cognitive communication deficit 08/10/2021   Urinary incontinence 08/10/2021   GERD (gastroesophageal reflux disease) 08/03/2021   Chronic post-traumatic stress disorder (PTSD)    Depression with anxiety    Right pontine stroke ([place]) 06/28/2021   Hemiplegia and hemiparesis following cerebral infarction affecting left non-dominant side ([place]) 06/24/2021   Chronic diastolic CHF (congestive heart failure) ([place]) 06/24/2021   Fall 06/24/2021   Abnormal LFTs 06/24/2021   Olecranon bursitis of left elbow 06/12/2021   Chronic hip pain (Bilateral) 06/09/2021   Chronic use of opiate for therapeutic purpose 05/09/2021   Greater trochanteric bursitis of hip (Left) 03/09/2021   Bursitis of hip (Left) 41/63/8453   Uncomplicated opioid dependence ([place]) 02/20/2021   Acute  conjunctivitis of left eye 01/02/2021   Unintentional weight loss 11/21/2020   Broken teeth (Right) 08/02/2020   History of MI (myocardial infarction) (July 2017) 08/02/2020   Neuropathy 06/09/2020   Dental abscess 06/09/2020    Foot drop (Left) 06/09/2020   Carotid stenosis, asymptomatic, right 04/27/2020   Chronic migraine 11/03/2019   Thrombocytosis 09/16/2019   Erythrocytosis 09/16/2019   Hypercalcemia 09/06/2019   Leukocytosis 09/06/2019   Polycythemia 09/06/2019   Chronic hip pain (Left) 08/27/2019   Osteoarthritis of hip (Left) 08/27/2019   Gluteal tendonitis of buttock (Left) 08/27/2019   Radial nerve palsy (Right) 07/15/2019   Neuropathy of radial nerve (Right) 06/30/2019   Abnormal bruising 06/25/2019   History of carotid endarterectomy (Right) 03/04/2019   Pain medication agreement signed 03/04/2019   Atypical facial pain (Right) 01/27/2019   Chronic ear pain (Right) 01/27/2019   Chronic jaw pain (Right) 01/27/2019   Geniculate Neuralgia (Right) 01/27/2019   Vitamin D deficiency 01/19/2019   Neurogenic pain 01/19/2019   Chronic anticoagulation (PLAVIX) 01/19/2019   Chronic pain syndrome 01/07/2019   Long term current use of opiate analgesic 01/07/2019   Long term prescription benzodiazepine use 01/07/2019   Pharmacologic therapy 01/07/2019   Disorder of skeletal system 01/07/2019   Problems influencing health status 01/07/2019   Chronic headaches (1ry area of Pain) (Right) 01/07/2019   Opiate use 09/24/2018   PVD (peripheral vascular disease) ([place]) 06/24/2018   Chronic ankle pain (Bilateral) 12/27/2017   Tendinopathy of gluteus medius (Right) 12/27/2017   Tendinopathy of gluteus medius (Left) 12/27/2017   Bilateral hip pain 12/26/2017   Chronic elbow pain (Left) 10/17/2017   Elevated troponin I level 05/21/2017   Major depressive disorder, recurrent episode, moderate (HCC) 11/19/2016   Insomnia 10/30/2016   Constipation 07/25/2016   S/P CABG x 4 07/06/2016   Bradycardia    CAD (coronary artery disease)    Carotid stenosis    CAD in native artery 06/29/2016   Elevated troponin 06/28/2016   Essential hypertension, malignant 06/28/2016   Mild tobacco abuse in early remission  06/28/2016   Essential hypertension    Malignant hypertension    Type 2 diabetes mellitus with other specified complication (HCC)    Chest pain with high risk for cardiac etiology 06/27/2016   NSTEMI (non-ST elevated myocardial infarction) ([place]) 06/27/2016   Proteinuria 03/15/2016   Renal artery stenosis ([place]) 03/15/2016   MDD (major depressive disorder) 10/17/2015   Agoraphobia with panic attacks 04/25/2015   HTN (hypertension), malignant 10/20/2013   Cluster headache 03/20/2012   HLD (hyperlipidemia) 07/26/2010   ADJUSTMENT DISORDER WITH MIXED FEATURES 07/26/2010   3:39 PM, 09/21/21 Etta Grandchild, PT, DPT Physical Therapist - [facility]  [phone])    [place] C, PT 09/21/2021, 3:35 PM  [place] MAIN [facility] SERVICES [address] [place], [place], [address] Phone: [phone]   Fax:  [phone]  Name: BLESSED GIRDNER MRN: [id number] Date of Birth: 27-Nov-1971

## 2021-09-23 ENCOUNTER — Other Ambulatory Visit: Payer: Self-pay | Admitting: Cardiovascular Disease

## 2021-09-25 ENCOUNTER — Ambulatory Visit: Payer: Managed Care, Other (non HMO) | Admitting: Occupational Therapy

## 2021-09-25 ENCOUNTER — Ambulatory Visit: Payer: Managed Care, Other (non HMO) | Admitting: Physical Therapy

## 2021-09-25 NOTE — Telephone Encounter (Signed)
Please advise if ok to refill medication.

## 2021-09-27 ENCOUNTER — Ambulatory Visit: Payer: Managed Care, Other (non HMO) | Admitting: Physical Therapy

## 2021-09-27 ENCOUNTER — Other Ambulatory Visit: Payer: Self-pay

## 2021-09-27 ENCOUNTER — Encounter: Payer: Self-pay | Admitting: Occupational Therapy

## 2021-09-27 ENCOUNTER — Ambulatory Visit: Payer: Managed Care, Other (non HMO) | Attending: Physical Medicine and Rehabilitation | Admitting: Occupational Therapy

## 2021-09-27 DIAGNOSIS — R262 Difficulty in walking, not elsewhere classified: Secondary | ICD-10-CM | POA: Insufficient documentation

## 2021-09-27 DIAGNOSIS — R482 Apraxia: Secondary | ICD-10-CM | POA: Diagnosis present

## 2021-09-27 DIAGNOSIS — I69354 Hemiplegia and hemiparesis following cerebral infarction affecting left non-dominant side: Secondary | ICD-10-CM | POA: Insufficient documentation

## 2021-09-27 DIAGNOSIS — R2681 Unsteadiness on feet: Secondary | ICD-10-CM

## 2021-09-27 DIAGNOSIS — R278 Other lack of coordination: Secondary | ICD-10-CM | POA: Insufficient documentation

## 2021-09-27 DIAGNOSIS — M6281 Muscle weakness (generalized): Secondary | ICD-10-CM

## 2021-09-27 DIAGNOSIS — R269 Unspecified abnormalities of gait and mobility: Secondary | ICD-10-CM | POA: Insufficient documentation

## 2021-09-27 DIAGNOSIS — R2689 Other abnormalities of gait and mobility: Secondary | ICD-10-CM | POA: Diagnosis present

## 2021-09-27 DIAGNOSIS — R471 Dysarthria and anarthria: Secondary | ICD-10-CM | POA: Diagnosis present

## 2021-09-27 DIAGNOSIS — R41841 Cognitive communication deficit: Secondary | ICD-10-CM | POA: Insufficient documentation

## 2021-09-27 NOTE — Therapy (Signed)
[place] MAIN [facility] SERVICES [address] [place], [place], [address] Phone: [phone]   Fax:  [phone]  Occupational Therapy Treatment  Patient Details  Name: Erika [name] MRN: [id number] Date of Birth: Jun 17, 1971 Referring Provider (OT): Dr. Leeroy Cha   Encounter Date: 09/27/2021   OT End of Session - 09/27/21 1700     Visit Number 12    Number of Visits 20    Date for OT Re-Evaluation 10/25/21    Authorization Type limited to 20 visits combined/multidiscipline    Authorization Time Period Reporting period starting 08/03/2021    OT Start Time 1300    OT Stop Time 1345    OT Time Calculation (min) 45 min    Activity Tolerance Patient limited by lethargy    Behavior During Therapy [facility] for tasks assessed/performed;Impulsive             Past Medical History:  Diagnosis Date   Arthralgia of temporomandibular joint    CAD, multiple vessel    a. 06/2016 Cath: ostLM 40%, ostLAD 40%, pLAD 95%, ost-pLCx 60%, pLCx 95%, mLCx 60%, mRCA 95%, D2 50%, LVSF nl;  b. 07/2016 CABG x 4 (LIMA->LAD, VG->Diag, VG->OM, VG->RCA); c. 08/2016 Cath: 3VD w/ 4/4 patent grafts. LAD distal to LIMA has diff dzs->Med rx; d. 08/2020 Cath: 4/4 patent grafts, native 3VD. EF 55-65%-->Med Rx.   Carotid arterial disease ([place])    a. 07/2016 s/p R CEA; b. 02/2021 U/S: RICA 22-02%, LICA 5-42%.   Clotting disorder ([place])    Depression    Diastolic dysfunction    a. 06/2016 Echo: EF 50-55%, mild inf wall HK, GR1DD, mild MR, RV sys fxn nl, mildly dilated LA, PASP nl   Fatty liver disease, nonalcoholic 7062   History of blood transfusion    with heart surgery   HLD (hyperlipidemia)    Labile hypertension    a. prior renal ngiogram negative for RAS in 03/2016; b. catecholamines and metanephrines normal, mildly elevated renin with normal aldosterone and normal ratio in 02/2016   Myocardial infarction [facility]) 2017   Obesity    PAD (peripheral artery disease) ([place])     a. 09/2018 s/p L SFA stenting; b. 07/2019 Periph Angio: Patent m/d L SFA stent w/ 100% L SFA distal to stent. L AT 100d, L Peroneal diff dzs-->Med Rx; c. 02/2021 ABIs: stable @ 0.61 on R and 0.46 on L.   PTSD (post-traumatic stress disorder)    Tobacco abuse    Type 2 diabetes mellitus ([place]) 12/2015    Past Surgical History:  Procedure Laterality Date   ABDOMINAL AORTOGRAM W/LOWER EXTREMITY [name] 10/15/2018   Procedure: ABDOMINAL AORTOGRAM W/LOWER EXTREMITY;  Surgeon: Wellington Hampshire, MD;  Location: [place] [facility];  Service: Cardiovascular;  Laterality: [name];   ABDOMINAL AORTOGRAM W/LOWER EXTREMITY Bilateral 08/19/2019   Procedure: ABDOMINAL AORTOGRAM W/LOWER EXTREMITY;  Surgeon: Wellington Hampshire, MD;  Location: [place] [facility];  Service: Cardiovascular;  Laterality: Bilateral;   CARDIAC CATHETERIZATION [name] 06/29/2016   Procedure: Left Heart Cath and Coronary Angiography;  Surgeon: Minna Merritts, MD;  Location: [place] [facility];  Service: Cardiovascular;  Laterality: [name];   CARDIAC CATHETERIZATION [name] 08/29/2016   Procedure: Left Heart Cath and Cors/Grafts Angiography;  Surgeon: Wellington Hampshire, MD;  Location: [place] [facility];  Service: Cardiovascular;  Laterality: [name];   CESAREAN SECTION     CHOLECYSTECTOMY     CORONARY ARTERY BYPASS GRAFT [name] 07/06/2016   Procedure: CORONARY ARTERY  BYPASS GRAFTING (CABG) x four, using left internal mammary artery and right leg greater saphenous vein harvested endoscopically;  Surgeon: Ivin Poot, MD;  Location: [place];  Service: Open Heart Surgery;  Laterality: [name];   ENDARTERECTOMY Right 07/06/2016   Procedure: ENDARTERECTOMY CAROTID;  Surgeon: Rosetta Posner, MD;  Location: [facility] OR;  Service: Vascular;  Laterality: Right;   ENDARTERECTOMY Right 04/27/2020   Procedure: REDO OF RIGHT ENDARTERECTOMY CAROTID;  Surgeon: Rosetta Posner, MD;  Location: [facility] OR;  Service: Vascular;  Laterality: Right;   LEFT HEART CATH AND CORS/GRAFTS ANGIOGRAPHY [name] 08/24/2020    Procedure: LEFT HEART CATH AND CORS/GRAFTS ANGIOGRAPHY;  Surgeon: Wellington Hampshire, MD;  Location: [place] [facility];  Service: Cardiovascular;  Laterality: [name];   PERIPHERAL VASCULAR CATHETERIZATION [name] 04/18/2016   Procedure: Renal Angiography;  Surgeon: Wellington Hampshire, MD;  Location: [place] [facility];  Service: Cardiovascular;  Laterality: [name];   PERIPHERAL VASCULAR INTERVENTION Left 10/15/2018   Procedure: PERIPHERAL VASCULAR INTERVENTION;  Surgeon: Wellington Hampshire, MD;  Location: [place] [facility];  Service: Cardiovascular;  Laterality: Left;  Left superficial femoral   TEE WITHOUT CARDIOVERSION [name] 07/06/2016   Procedure: TRANSESOPHAGEAL ECHOCARDIOGRAM (TEE);  Surgeon: Ivin Poot, MD;  Location: [place];  Service: Open Heart Surgery;  Laterality: [name];   TONSILLECTOMY      There were no vitals filed for this visit.   Subjective Assessment - 09/27/21 1659     Subjective  Spouse reports pt's legs and L arm were weaker this morning, and pt is still having intermittent delirium.    Patient is accompanied by: Family member    Pertinent History HX of CAD, CABG, DM2, s/p R pontine stroke    Limitations L sided hemiplegia, impulsivity, judgment, safety awareness    Patient Stated Goals "I want to be able to take care of myself."    Currently in Pain? Yes    Pain Score 3     Pain Location Shoulder    Pain Orientation Left    Pain Descriptors / Indicators Aching;Sore            OT TREATMENT     Therapeutic Exercise:   Pt. tolerated PROM in all joint ranges for left shoulder flexion, extension, abduction, horizontal abduction, and adduction, elbow flexion, extension, forearm supination, pronation, wrist extension, digit MP, PIP, and DIP flexion, and extension, Thumb abduction. Pt. Performed wrist extension with facilitation/tapping at the forearm for wrist extension, and digit extension.    Manual Therapy:   Pt. tolerated scapular mobilizations for elevation, depression,  abduction/rotation.  Pt. tolerated retrograde massage, and soft tissue mobilizations for metacarpal spread stretches to the left hand secondary to increased edema in the left hand. Manual therapy was performed independent of, and in preparation for ther. Ex.   Pt. tolerated ROM, and manual therapy well with decreased edema noted in the left hand following intervention.  Pt. was able to tolerate ROM to the LUE. Pt. Presents with active volitional movement for initiation of wrist extension, thumb palmar abduction, digit flexion. Pt. presented with increased edema initially through the dorsal aspect of her hand, and digits with decreased edema following  manual therapy, and ROM. Pt. education was provided about, positioning of the LUE in a supported and elevated position for edema control, as well as finding opportunities to engage the LUE as a gross assist during daily activities. Pt. continues to work on improving edema, normalizing tone, and facilitating active functional movement to increase engagement  of the LUE during ADLs, and IADLs in order to maximize overall independence.                          OT Education - 09/27/21 1700     Education Details LUE positioning with use of arm trough or pillow when up in wc    Person(s) Educated Patient    Methods Explanation;Demonstration;Verbal cues    Comprehension Verbalized understanding;Returned demonstration;Need further instruction;Verbal cues required              OT Short Term Goals - 09/18/21 1542       OT SHORT TERM GOAL #1   Title Pt will be perform HEP for LUE with min vc.    Baseline 10th visit: Pt. requires assist to perform Self-ROM. Eval: Initiated at eval, further instruction needed    Time 6    Period Weeks    Status On-going               OT Long Term Goals - 09/18/21 1536       OT LONG TERM GOAL #2   Title Pt will perform UB ADLs with set up    Baseline 10th vist: independent doffing a shirt,  MinA donning    Time 12    Period Weeks    Status On-going    Target Date 10/25/21      OT LONG TERM GOAL #3   Title Pt will perform LB ADLs with min A    Baseline 10th visit: Pt. is able to donn pants with minA hiking the back waistband. Pt. requires maxA donning shoes, ans socks. Eval: Max A to perform LB ADLs    Time 12    Period Weeks    Status On-going    Target Date 10/25/21      OT LONG TERM GOAL #4   Title Pt will perform ADL transfers with distant supv    Baseline 10th visit: CGA. Eval: min A-mod A to perform ADL transfers    Time 12    Period Weeks    Status On-going    Target Date 10/25/21      OT LONG TERM GOAL #5   Title Pt will increase LUE strength by 2 MM grades to work towards use of LUE as a stabilizer for self care.    Baseline 10th visit: Pt. is starting to engage her left hand as a stabilizer.: L shoulder 1/5, elbow/wrist/hand 0/5.  Unable to use LUE as a stabilzer for self care.    Time 12    Period Weeks    Status On-going    Target Date 10/25/21                   Plan - 09/27/21 1700     Clinical Impression Statement Pt. tolerated ROM, and manual therapy well with decreased edema noted in the left hand following intervention.  Pt. was able to tolerate ROM to the LUE. Pt. Presents with active volitional movement for initiation of wrist extension, thumb palmar abduction, digit flexion. Pt. presented with increased edema initially through the dorsal aspect of her hand, and digits with decreased edema following  manual therapy, and ROM. Pt. education was provided about, positioning of the LUE in a supported and elevated position for edema control, as well as finding opportunities to engage the LUE as a gross assist during daily activities. Pt. continues to work on improving edema, normalizing tone, and facilitating  active functional movement to increase engagement of the LUE during ADLs, and IADLs in order to maximize overall independence.     OT  Occupational Profile and History Detailed Assessment- Review of Records and additional review of physical, cognitive, psychosocial history related to current functional performance    Occupational performance deficits (Please refer to evaluation for details): ADL's;IADL's;Leisure;Work    Marketing executive / Function / Physical Skills ADL;Continence;Dexterity;Strength;Vision;Balance;Coordination;FMC;IADL;Body mechanics;Endurance;Gait;UE functional use;Decreased knowledge of use of DME;GMC;Mobility    Rehab Potential Good    Clinical Decision Making Several treatment options, min-mod task modification necessary    Comorbidities Affecting Occupational Performance: May have comorbidities impacting occupational performance    Modification or Assistance to Complete Evaluation  Min-Moderate modification of tasks or assist with assess necessary to complete eval    OT Frequency 2x / week    OT Duration 12 weeks    OT Treatment/Interventions Self-care/ADL training;Therapeutic exercise;DME and/or AE instruction;Functional Mobility Training;Balance training;Electrical Stimulation;Neuromuscular education;Manual Therapy;Splinting;Visual/perceptual remediation/compensation;Moist Heat;Passive range of motion;Therapeutic activities;Patient/family education    Consulted and Agree with Plan of Care Patient;Family member/caregiver             Patient will benefit from skilled therapeutic intervention in order to improve the following deficits and impairments:   Body Structure / Function / Physical Skills: ADL, Continence, Dexterity, Strength, Vision, Balance, Coordination, FMC, IADL, Body mechanics, Endurance, Gait, UE functional use, Decreased knowledge of use of DME, GMC, Mobility       Visit Diagnosis: Muscle weakness (generalized)    Problem List Patient Active Problem List   Diagnosis Date Noted   Cerebrovascular accident (CVA) due to occlusion of vertebral artery ([place]) 09/11/2021   Cognitive change  09/11/2021   Cognitive communication deficit 08/10/2021   Urinary incontinence 08/10/2021   GERD (gastroesophageal reflux disease) 08/03/2021   Chronic post-traumatic stress disorder (PTSD)    Depression with anxiety    Right pontine stroke ([place]) 06/28/2021   Hemiplegia and hemiparesis following cerebral infarction affecting left non-dominant side ([place]) 06/24/2021   Chronic diastolic CHF (congestive heart failure) ([place]) 06/24/2021   Fall 06/24/2021   Abnormal LFTs 06/24/2021   Olecranon bursitis of left elbow 06/12/2021   Chronic hip pain (Bilateral) 06/09/2021   Chronic use of opiate for therapeutic purpose 05/09/2021   Greater trochanteric bursitis of hip (Left) 03/09/2021   Bursitis of hip (Left) 59/56/3875   Uncomplicated opioid dependence ([place]) 02/20/2021   Acute conjunctivitis of left eye 01/02/2021   Unintentional weight loss 11/21/2020   Broken teeth (Right) 08/02/2020   History of MI (myocardial infarction) (July 2017) 08/02/2020   Neuropathy 06/09/2020   Dental abscess 06/09/2020   Foot drop (Left) 06/09/2020   Carotid stenosis, asymptomatic, right 04/27/2020   Chronic migraine 11/03/2019   Thrombocytosis 09/16/2019   Erythrocytosis 09/16/2019   Hypercalcemia 09/06/2019   Leukocytosis 09/06/2019   Polycythemia 09/06/2019   Chronic hip pain (Left) 08/27/2019   Osteoarthritis of hip (Left) 08/27/2019   Gluteal tendonitis of buttock (Left) 08/27/2019   Radial nerve palsy (Right) 07/15/2019   Neuropathy of radial nerve (Right) 06/30/2019   Abnormal bruising 06/25/2019   History of carotid endarterectomy (Right) 03/04/2019   Pain medication agreement signed 03/04/2019   Atypical facial pain (Right) 01/27/2019   Chronic ear pain (Right) 01/27/2019   Chronic jaw pain (Right) 01/27/2019   Geniculate Neuralgia (Right) 01/27/2019   Vitamin D deficiency 01/19/2019   Neurogenic pain 01/19/2019   Chronic anticoagulation (PLAVIX) 01/19/2019   Chronic pain syndrome 01/07/2019    Long term current use  of opiate analgesic 01/07/2019   Long term prescription benzodiazepine use 01/07/2019   Pharmacologic therapy 01/07/2019   Disorder of skeletal system 01/07/2019   Problems influencing health status 01/07/2019   Chronic headaches (1ry area of Pain) (Right) 01/07/2019   Opiate use 09/24/2018   PVD (peripheral vascular disease) ([place]) 06/24/2018   Chronic ankle pain (Bilateral) 12/27/2017   Tendinopathy of gluteus medius (Right) 12/27/2017   Tendinopathy of gluteus medius (Left) 12/27/2017   Bilateral hip pain 12/26/2017   Chronic elbow pain (Left) 10/17/2017   Elevated troponin I level 05/21/2017   Major depressive disorder, recurrent episode, moderate ([place]) 11/19/2016   Insomnia 10/30/2016   Constipation 07/25/2016   S/P CABG x 4 07/06/2016   Bradycardia    CAD (coronary artery disease)    Carotid stenosis    CAD in native artery 06/29/2016   Elevated troponin 06/28/2016   Essential hypertension, malignant 06/28/2016   Mild tobacco abuse in early remission 06/28/2016   Essential hypertension    Malignant hypertension    Type 2 diabetes mellitus with other specified complication ([place])    Chest pain with high risk for cardiac etiology 06/27/2016   NSTEMI (non-ST elevated myocardial infarction) ([place]) 06/27/2016   Proteinuria 03/15/2016   Renal artery stenosis ([place]) 03/15/2016   MDD (major depressive disorder) 10/17/2015   Agoraphobia with panic attacks 04/25/2015   HTN (hypertension), malignant 10/20/2013   Cluster headache 03/20/2012   HLD (hyperlipidemia) 07/26/2010   ADJUSTMENT DISORDER WITH MIXED FEATURES 07/26/2010    Harrel Carina, MS, OTR/L 09/27/2021, 5:03 PM  [place] MAIN [facility] SERVICES [address] [place], [place], [address] Phone: [phone]   Fax:  [phone]  Name: Erika [name] MRN: [id number] Date of Birth: May 17, 1971

## 2021-09-27 NOTE — Therapy (Signed)
[facility] [address] [place], [place], [address] Phone: [phone]   Fax:  [phone]  Physical Therapy Treatment  Patient Details  Name: Erika [name] MRN: [id number] Date of Birth: Jan 01, 1971 Referring Provider (PT): Leeroy Cha MD   Encounter Date: 09/27/2021   PT End of Session - 09/27/21 1605     Visit Number 12    Number of Visits 20    Date for PT Re-Evaluation 09/14/21    Authorization Type [employer] max visits 20x per year speech, OT, PT    Authorization Time Period 08/03/21-10/26/21    Progress Note Due on Visit 20    PT Start Time 1600    PT Stop Time 1643    PT Time Calculation (min) 43 min    Equipment Utilized During Treatment Gait belt;Other (comment)   Left AFO   Activity Tolerance Patient tolerated treatment well;No increased pain;Patient limited by fatigue;Patient limited by pain    Behavior During Therapy [facility] for tasks assessed/performed;Impulsive             Past Medical History:  Diagnosis Date   Arthralgia of temporomandibular joint    CAD, multiple vessel    a. 06/2016 Cath: ostLM 40%, ostLAD 40%, pLAD 95%, ost-pLCx 60%, pLCx 95%, mLCx 60%, mRCA 95%, D2 50%, LVSF nl;  b. 07/2016 CABG x 4 (LIMA->LAD, VG->Diag, VG->OM, VG->RCA); c. 08/2016 Cath: 3VD w/ 4/4 patent grafts. LAD distal to LIMA has diff dzs->Med rx; d. 08/2020 Cath: 4/4 patent grafts, native 3VD. EF 55-65%-->Med Rx.   Carotid arterial disease ([place])    a. 07/2016 s/p R CEA; b. 02/2021 U/S: RICA 67-61%, LICA 9-50%.   Clotting disorder ([place])    Depression    Diastolic dysfunction    a. 06/2016 Echo: EF 50-55%, mild inf wall HK, GR1DD, mild MR, RV sys fxn nl, mildly dilated LA, PASP nl   Fatty liver disease, nonalcoholic 9326   History of blood transfusion    with heart surgery   HLD (hyperlipidemia)    Labile hypertension    a. prior renal ngiogram negative for RAS in 03/2016; b. catecholamines and metanephrines normal, mildly  elevated renin with normal aldosterone and normal ratio in 02/2016   Myocardial infarction [facility]) 2017   Obesity    PAD (peripheral artery disease) ([place])    a. 09/2018 s/p L SFA stenting; b. 07/2019 Periph Angio: Patent m/d L SFA stent w/ 100% L SFA distal to stent. L AT 100d, L Peroneal diff dzs-->Med Rx; c. 02/2021 ABIs: stable @ 0.61 on R and 0.46 on L.   PTSD (post-traumatic stress disorder)    Tobacco abuse    Type 2 diabetes mellitus ([place]) 12/2015    Past Surgical History:  Procedure Laterality Date   ABDOMINAL AORTOGRAM W/LOWER EXTREMITY [name] 10/15/2018   Procedure: ABDOMINAL AORTOGRAM W/LOWER EXTREMITY;  Surgeon: Wellington Hampshire, MD;  Location: [place] CV LAB;  Service: Cardiovascular;  Laterality: [name];   ABDOMINAL AORTOGRAM W/LOWER EXTREMITY Bilateral 08/19/2019   Procedure: ABDOMINAL AORTOGRAM W/LOWER EXTREMITY;  Surgeon: Wellington Hampshire, MD;  Location: [place] [facility];  Service: Cardiovascular;  Laterality: Bilateral;   CARDIAC CATHETERIZATION [name] 06/29/2016   Procedure: Left Heart Cath and Coronary Angiography;  Surgeon: Minna Merritts, MD;  Location: [place] [facility];  Service: Cardiovascular;  Laterality: [name];   CARDIAC CATHETERIZATION [name] 08/29/2016   Procedure: Left Heart Cath and Cors/Grafts Angiography;  Surgeon: Wellington Hampshire, MD;  Location: [place] [facility]  [facility];  Service: Cardiovascular;  Laterality: [name];   CESAREAN SECTION     CHOLECYSTECTOMY     CORONARY ARTERY BYPASS GRAFT [name] 07/06/2016   Procedure: CORONARY ARTERY BYPASS GRAFTING (CABG) x four, using left internal mammary artery and right leg greater saphenous vein harvested endoscopically;  Surgeon: Ivin Poot, MD;  Location: [place];  Service: Open Heart Surgery;  Laterality: [name];   ENDARTERECTOMY Right 07/06/2016   Procedure: ENDARTERECTOMY CAROTID;  Surgeon: Rosetta Posner, MD;  Location: [facility] OR;  Service: Vascular;  Laterality: Right;   ENDARTERECTOMY Right 04/27/2020   Procedure: REDO OF RIGHT  ENDARTERECTOMY CAROTID;  Surgeon: Rosetta Posner, MD;  Location: [facility] OR;  Service: Vascular;  Laterality: Right;   LEFT HEART CATH AND CORS/GRAFTS ANGIOGRAPHY [name] 08/24/2020   Procedure: LEFT HEART CATH AND CORS/GRAFTS ANGIOGRAPHY;  Surgeon: Wellington Hampshire, MD;  Location: [place] [facility] LAB;  Service: Cardiovascular;  Laterality: [name];   PERIPHERAL VASCULAR CATHETERIZATION [name] 04/18/2016   Procedure: Renal Angiography;  Surgeon: Wellington Hampshire, MD;  Location: [place] CV LAB;  Service: Cardiovascular;  Laterality: [name];   PERIPHERAL VASCULAR INTERVENTION Left 10/15/2018   Procedure: PERIPHERAL VASCULAR INTERVENTION;  Surgeon: Wellington Hampshire, MD;  Location: [place] CV LAB;  Service: Cardiovascular;  Laterality: Left;  Left superficial femoral   TEE WITHOUT CARDIOVERSION [name] 07/06/2016   Procedure: TRANSESOPHAGEAL ECHOCARDIOGRAM (TEE);  Surgeon: Ivin Poot, MD;  Location: [place];  Service: Open Heart Surgery;  Laterality: [name];   TONSILLECTOMY      There were no vitals filed for this visit.   Subjective Assessment - 09/27/21 1604     Subjective Pt doing well today in general, no medication updates or falls since prior visit.    Pertinent History Pt presented to [facility] 06/24/21 c c/c of slurred speech, L facial droop & LUE weakness. MRI revealed acute R pontine perforator infarct with chronic R parietal & occipital infarcts. PMH: HTN, HLD, DM, tobacco abuse, PAD, CAD, CABG, dCHF, peptic foot drop, renal artery stenosis, MI and CAD (s/p of R CEA 2017), PTSD, adjustment disorder, obesity, Left hip OA s/p OPPT 2020. Pt DC 06/28/21 to inpatient rehab in [place], [place] to home 08/01/21. AT CIR pt worked extensively on STS with MinA or Supervision and HW, Stand and squat pivot transfers with MinA, AMB progressed to ~23f max, mostly with HW (some RW use with fixed LUE on handle), but was mostly limited in further progression of distance 2/2 chronic PAD ischaemic pain or Left knee buckling despite AFO use.  CIR recommending update to more supporting AFO at transfer to OPPT. Since home, pt is using WC to navigate the environment, family assists with ADL performance, WC still needs to be modified to fit through BR doorframe. Pt reports not feeling safe with HW at present. Husband has been helping with gnelte stretching in mornings as pt sleeps in a flexion synergy posture on LLE.               Treatment provided this session  Therex:   Soccer ball kicks x 25  -Improved efficacy compared to previous session where this was performed.  Patient able to better control with her soccer ball went indicating improved quad muscle activation.  Hamstring curls x20 with red Thera-Band -Cues for full range of motion and muscle activation.     Neuro Re- Ed:      Stepping around mat table x 2 -left side stepping, retro walking, anterior walking, right sidestepping -significant  difficulty with retro ambulation, tends to attempt to sidestep with retro ambulation task.  Patient unable to control where hips are pointed despite verbal cues on this repetition. -Prior to second attempt patient was educated regarding external cue of keeping hips aligned with wall anteriorly -right sidestepping, retro walking, forward walking, left sidestepping- increased efficacy with this attempt, cues for keeping hips pointed at certain wall to improve efficacy with task.   Balloon hits in standing with no upper extremity support -Standing with CGA balloon hit back and forth, no UE support on hemiwalker, no LOB, some difficulty controlling location of where she was sitting balloon. -Performed to work on static stabilization and standing without upper extremity support. -Unable to hit with right upper extremity.  STS transition, ambulation around cone with hemi walker 2 x 35 feet total ambulation each repetition  Sit to stand 2 sets of 5 repetitions without upper extremity support and with standby assist from physical  therapist.  No loss of balance noted and patient did not require use of hemiwalker during this task indicating improved lower extremity strength and balance.    Pt required occasional rest breaks due fatigue, PT was quick to ask when pt appeared to be fatiguing in order to prevent excessive fatigue. Unless otherwise stated, CGA was provided and gait belt donned in order to ensure pt safety Note: Portions of this document were prepared using Dragon voice recognition software and although reviewed may contain unintentional dictation errors in syntax, grammar, or spelling.  Pt educated throughout session about proper posture and technique with exercises. Improved exercise technique, movement at target joints, use of target muscles after min to mod verbal, visual, tactile cues.                            PT Short Term Goals - 09/18/21 1623       PT SHORT TERM GOAL #1   Title Pt to demonstrate improved tolerance to standing >3 minutes without physical assist or LOB. DME ad lib    Baseline <2 minutes at eval    Time 4    Period Weeks    Status Achieved    Target Date 08/31/21      PT SHORT TERM GOAL #2   Title Pt to demonstrate perfromance of 5xSTS c AFO/Hemi walker at supervision level independence without LOB or physical assist.    Baseline Needs minGuard to MinA to perform at eval, CGA level, not safe at supervision level at this point, 44.55 sec    Time 4    Period Weeks    Status On-going    Target Date 08/31/21      PT SHORT TERM GOAL #3   Title Pt to demonstrate AMB ~65f at supervision level with AFO, HW without LOB.    Baseline Not met, CGA required for safety    Time 6    Period Weeks    Status On-going    Target Date 09/14/21      PT SHORT TERM GOAL #4   Title Pt to demonstrate TUG test <120sec c HW and AFO at minguard - MinA level to improve independence with transfers and reduce falls risk at home.    Baseline No tug performed at eval, 100.59 sec  with CGA    Time 7    Period Weeks    Status Achieved    Target Date 09/21/21  PT Long Term Goals - 09/18/21 1634       PT LONG TERM GOAL #1   Title Pt to demonstrate perfromance of 5xSTS c AFO/Hemi walker at supervision level independence without LOB or physical assist in less than 20sec.    Baseline Unable at eval, requires minguard to minA, PN 9/26: 44.5 sec with CGA for sfety    Time 8    Period Weeks    Status On-going    Target Date 11/14/21      PT LONG TERM GOAL #2   Title Pt to demonstrate FOTO score improvement >10 points to demonstrate improved perception of ability in ADL    Baseline 23 at eval:53 at PN, revised to increase by additional 10    Time 8    Period Weeks    Status Revised    Target Date 11/14/21      PT LONG TERM GOAL #3   Title Pt able to demonstrate stand pivot transfer with HW and AFO without LOB at supervision level to modI.    Baseline SBA for safety    Time 8    Period Weeks    Status On-going    Target Date 11/14/21      PT LONG TERM GOAL #4   Title Pt to demonstrate TUG test <60sec c HW and AFO at supervision level to improve independence with transfers and reduce falls risk at home.    Baseline Tug in 100.59 sec    Time 8    Period Weeks    Status New    Target Date 11/14/21                   Plan - 09/27/21 1612     Clinical Impression Statement Plan    Personal Factors and Comorbidities Age;Comorbidity 3+;Fitness;Past/Current Experience;Time since onset of injury/illness/exacerbation    Comorbidities DM, HTN, PVD, clotting disorder, CAD    Examination-Activity Limitations Squat;Stairs;Bathing;Bed Mobility;Dressing;Reach Overhead;Hygiene/Grooming;Bend;Carry;Locomotion Level;Transfers;Toileting;Stand    Examination-Participation Restrictions Cleaning;Community Activity;Occupation;Laundry;Driving;Yard Work    [employer] Evolving/Moderate complexity    Rehab Potential Good    PT  Frequency 2x / week    PT Duration 12 weeks    PT Treatment/Interventions Electrical Stimulation;Gait training;Stair training;Functional mobility training;Therapeutic activities;Therapeutic exercise;Balance training;Neuromuscular re-education;Patient/family education;Manual techniques;Wheelchair mobility training;Energy conservation;Passive range of motion;Orthotic Fit/Training;DME Instruction;ADLs/Self Care Home Management    PT Next Visit Plan work toward supervision level STS and SPT c HW, sustained standing tolerance.    PT Home Exercise Plan Not updated this visit- Encouraged seated LE strengthening and practicing gait with husband.;  No updates    Consulted and Agree with Plan of Care Patient;Family member/caregiver    Family Member Consulted husband             Patient will benefit from skilled therapeutic intervention in order to improve the following deficits and impairments:  Decreased endurance, Decreased mobility, Difficulty walking, Hypomobility, Decreased balance, Abnormal gait, Increased muscle spasms, Cardiopulmonary status limiting activity, Impaired flexibility, Hypermobility, Decreased strength, Decreased activity tolerance  Visit Diagnosis: Muscle weakness (generalized)  Unsteadiness on feet  Other abnormalities of gait and mobility  Difficulty in walking, not elsewhere classified  Abnormality of gait and mobility     Problem List Patient Active Problem List   Diagnosis Date Noted   Cerebrovascular accident (CVA) due to occlusion of vertebral artery ([place]) 09/11/2021   Cognitive change 09/11/2021   Cognitive communication deficit 08/10/2021   Urinary incontinence 08/10/2021   GERD (gastroesophageal reflux disease) 08/03/2021  Chronic post-traumatic stress disorder (PTSD)    Depression with anxiety    Right pontine stroke ([place]) 06/28/2021   Hemiplegia and hemiparesis following cerebral infarction affecting left non-dominant side (HCC) 06/24/2021   Chronic  diastolic CHF (congestive heart failure) ([place]) 06/24/2021   Fall 06/24/2021   Abnormal LFTs 06/24/2021   Olecranon bursitis of left elbow 06/12/2021   Chronic hip pain (Bilateral) 06/09/2021   Chronic use of opiate for therapeutic purpose 05/09/2021   Greater trochanteric bursitis of hip (Left) 03/09/2021   Bursitis of hip (Left) 40/37/0964   Uncomplicated opioid dependence ([place]) 02/20/2021   Acute conjunctivitis of left eye 01/02/2021   Unintentional weight loss 11/21/2020   Broken teeth (Right) 08/02/2020   History of MI (myocardial infarction) (July 2017) 08/02/2020   Neuropathy 06/09/2020   Dental abscess 06/09/2020   Foot drop (Left) 06/09/2020   Carotid stenosis, asymptomatic, right 04/27/2020   Chronic migraine 11/03/2019   Thrombocytosis 09/16/2019   Erythrocytosis 09/16/2019   Hypercalcemia 09/06/2019   Leukocytosis 09/06/2019   Polycythemia 09/06/2019   Chronic hip pain (Left) 08/27/2019   Osteoarthritis of hip (Left) 08/27/2019   Gluteal tendonitis of buttock (Left) 08/27/2019   Radial nerve palsy (Right) 07/15/2019   Neuropathy of radial nerve (Right) 06/30/2019   Abnormal bruising 06/25/2019   History of carotid endarterectomy (Right) 03/04/2019   Pain medication agreement signed 03/04/2019   Atypical facial pain (Right) 01/27/2019   Chronic ear pain (Right) 01/27/2019   Chronic jaw pain (Right) 01/27/2019   Geniculate Neuralgia (Right) 01/27/2019   Vitamin D deficiency 01/19/2019   Neurogenic pain 01/19/2019   Chronic anticoagulation (PLAVIX) 01/19/2019   Chronic pain syndrome 01/07/2019   Long term current use of opiate analgesic 01/07/2019   Long term prescription benzodiazepine use 01/07/2019   Pharmacologic therapy 01/07/2019   Disorder of skeletal system 01/07/2019   Problems influencing health status 01/07/2019   Chronic headaches (1ry area of Pain) (Right) 01/07/2019   Opiate use 09/24/2018   PVD (peripheral vascular disease) ([place]) 06/24/2018    Chronic ankle pain (Bilateral) 12/27/2017   Tendinopathy of gluteus medius (Right) 12/27/2017   Tendinopathy of gluteus medius (Left) 12/27/2017   Bilateral hip pain 12/26/2017   Chronic elbow pain (Left) 10/17/2017   Elevated troponin I level 05/21/2017   Major depressive disorder, recurrent episode, moderate (HCC) 11/19/2016   Insomnia 10/30/2016   Constipation 07/25/2016   S/P CABG x 4 07/06/2016   Bradycardia    CAD (coronary artery disease)    Carotid stenosis    CAD in native artery 06/29/2016   Elevated troponin 06/28/2016   Essential hypertension, malignant 06/28/2016   Mild tobacco abuse in early remission 06/28/2016   Essential hypertension    Malignant hypertension    Type 2 diabetes mellitus with other specified complication ([place])    Chest pain with high risk for cardiac etiology 06/27/2016   NSTEMI (non-ST elevated myocardial infarction) ([place]) 06/27/2016   Proteinuria 03/15/2016   Renal artery stenosis ([place]) 03/15/2016   MDD (major depressive disorder) 10/17/2015   Agoraphobia with panic attacks 04/25/2015   HTN (hypertension), malignant 10/20/2013   Cluster headache 03/20/2012   HLD (hyperlipidemia) 07/26/2010   ADJUSTMENT DISORDER WITH MIXED FEATURES 07/26/2010    Particia Lather, PT 09/27/2021, 4:16 PM  [facility] MAIN [facility] SERVICES [address], [place], [address] Phone: [phone]   Fax:  [phone]  Name: Erika [name] MRN: [id number] Date of Birth: 1971-06-20

## 2021-10-02 ENCOUNTER — Encounter: Payer: Managed Care, Other (non HMO) | Admitting: Registered Nurse

## 2021-10-03 ENCOUNTER — Other Ambulatory Visit: Payer: Self-pay

## 2021-10-03 ENCOUNTER — Ambulatory Visit: Payer: Managed Care, Other (non HMO)

## 2021-10-03 ENCOUNTER — Ambulatory Visit: Payer: Managed Care, Other (non HMO) | Admitting: Physical Therapy

## 2021-10-03 ENCOUNTER — Telehealth: Payer: Self-pay | Admitting: Family Medicine

## 2021-10-03 DIAGNOSIS — R2681 Unsteadiness on feet: Secondary | ICD-10-CM

## 2021-10-03 DIAGNOSIS — R262 Difficulty in walking, not elsewhere classified: Secondary | ICD-10-CM

## 2021-10-03 DIAGNOSIS — R269 Unspecified abnormalities of gait and mobility: Secondary | ICD-10-CM

## 2021-10-03 DIAGNOSIS — R2689 Other abnormalities of gait and mobility: Secondary | ICD-10-CM

## 2021-10-03 DIAGNOSIS — R278 Other lack of coordination: Secondary | ICD-10-CM

## 2021-10-03 DIAGNOSIS — M6281 Muscle weakness (generalized): Secondary | ICD-10-CM

## 2021-10-03 DIAGNOSIS — I69354 Hemiplegia and hemiparesis following cerebral infarction affecting left non-dominant side: Secondary | ICD-10-CM

## 2021-10-03 NOTE — Telephone Encounter (Signed)
Erika [name] She doesn't needed any information,  case management is closed the referral

## 2021-10-03 NOTE — Therapy (Signed)
[place] [facility] [address] [place], [place], [address] Phone: [phone]   Fax:  [phone]  Physical Therapy Treatment  Patient Details  Name: Erika [name] MRN: [id number] Date of Birth: [birth year] Referring Provider (PT): Leeroy Cha MD   Encounter Date: 10/03/2021   PT End of Session - 10/03/21 1452     Visit Number 13    Number of Visits 20    Date for PT Re-Evaluation 09/14/21    Authorization Type [employer] max visits 20x per year speech, OT, PT    Authorization Time Period 08/03/21-10/26/21    Progress Note Due on Visit 20    PT Start Time 1500    PT Stop Time 1545    PT Time Calculation (min) 45 min    Equipment Utilized During Treatment Gait belt;Other (comment)   Left AFO   Activity Tolerance Patient tolerated treatment well;No increased pain;Patient limited by fatigue;Patient limited by pain    Behavior During Therapy [facility] for tasks assessed/performed;Impulsive             Past Medical History:  Diagnosis Date   Arthralgia of temporomandibular joint    CAD, multiple vessel    a. 06/2016 Cath: ostLM 40%, ostLAD 40%, pLAD 95%, ost-pLCx 60%, pLCx 95%, mLCx 60%, mRCA 95%, D2 50%, LVSF nl;  b. 07/2016 CABG x 4 (LIMA->LAD, VG->Diag, VG->OM, VG->RCA); c. 08/2016 Cath: 3VD w/ 4/4 patent grafts. LAD distal to LIMA has diff dzs->Med rx; d. 08/2020 Cath: 4/4 patent grafts, native 3VD. EF 55-65%-->Med Rx.   Carotid arterial disease ([place])    a. 07/2016 s/p R CEA; b. 02/2021 U/S: RICA 79-89%, LICA 2-11%.   Clotting disorder ([place])    Depression    Diastolic dysfunction    a. 06/2016 Echo: EF 50-55%, mild inf wall HK, GR1DD, mild MR, RV sys fxn nl, mildly dilated LA, PASP nl   Fatty liver disease, nonalcoholic 9417   History of blood transfusion    with heart surgery   HLD (hyperlipidemia)    Labile hypertension    a. prior renal ngiogram negative for RAS in 03/2016; b. catecholamines and metanephrines normal, mildly  elevated renin with normal aldosterone and normal ratio in 02/2016   Myocardial infarction [facility]) 2017   Obesity    PAD (peripheral artery disease) ([place])    a. 09/2018 s/p L SFA stenting; b. 07/2019 Periph Angio: Patent m/d L SFA stent w/ 100% L SFA distal to stent. L AT 100d, L Peroneal diff dzs-->Med Rx; c. 02/2021 ABIs: stable @ 0.61 on R and 0.46 on L.   PTSD (post-traumatic stress disorder)    Tobacco abuse    Type 2 diabetes mellitus ([place]) 12/2015    Past Surgical History:  Procedure Laterality Date   ABDOMINAL AORTOGRAM W/LOWER EXTREMITY [name] 10/15/2018   Procedure: ABDOMINAL AORTOGRAM W/LOWER EXTREMITY;  Surgeon: Wellington Hampshire, MD;  Location: [place] [facility];  Service: Cardiovascular;  Laterality: [name];   ABDOMINAL AORTOGRAM W/LOWER EXTREMITY Bilateral 08/19/2019   Procedure: ABDOMINAL AORTOGRAM W/LOWER EXTREMITY;  Surgeon: Wellington Hampshire, MD;  Location: [place] [facility];  Service: Cardiovascular;  Laterality: Bilateral;   CARDIAC CATHETERIZATION [name] 06/29/2016   Procedure: Left Heart Cath and Coronary Angiography;  Surgeon: Minna Merritts, MD;  Location: [place] [facility];  Service: Cardiovascular;  Laterality: [name];   CARDIAC CATHETERIZATION [name] 08/29/2016   Procedure: Left Heart Cath and Cors/Grafts Angiography;  Surgeon: Wellington Hampshire, MD;  Location: [place] INVASIVE CV  LAB;  Service: Cardiovascular;  Laterality: [name];   CESAREAN SECTION     CHOLECYSTECTOMY     CORONARY ARTERY BYPASS GRAFT [name] 07/06/2016   Procedure: CORONARY ARTERY BYPASS GRAFTING (CABG) x four, using left internal mammary artery and right leg greater saphenous vein harvested endoscopically;  Surgeon: Ivin Poot, MD;  Location: [place];  Service: Open Heart Surgery;  Laterality: [name];   ENDARTERECTOMY Right 07/06/2016   Procedure: ENDARTERECTOMY CAROTID;  Surgeon: Rosetta Posner, MD;  Location: [facility] OR;  Service: Vascular;  Laterality: Right;   ENDARTERECTOMY Right 04/27/2020   Procedure: REDO OF RIGHT  ENDARTERECTOMY CAROTID;  Surgeon: Rosetta Posner, MD;  Location: [facility] OR;  Service: Vascular;  Laterality: Right;   LEFT HEART CATH AND CORS/GRAFTS ANGIOGRAPHY [name] 08/24/2020   Procedure: LEFT HEART CATH AND CORS/GRAFTS ANGIOGRAPHY;  Surgeon: Wellington Hampshire, MD;  Location: [place] [facility];  Service: Cardiovascular;  Laterality: [name];   PERIPHERAL VASCULAR CATHETERIZATION [name] 04/18/2016   Procedure: Renal Angiography;  Surgeon: Wellington Hampshire, MD;  Location: [place] [facility];  Service: Cardiovascular;  Laterality: [name];   PERIPHERAL VASCULAR INTERVENTION Left 10/15/2018   Procedure: PERIPHERAL VASCULAR INTERVENTION;  Surgeon: Wellington Hampshire, MD;  Location: [place] [facility];  Service: Cardiovascular;  Laterality: Left;  Left superficial femoral   TEE WITHOUT CARDIOVERSION [name] 07/06/2016   Procedure: TRANSESOPHAGEAL ECHOCARDIOGRAM (TEE);  Surgeon: Ivin Poot, MD;  Location: [place];  Service: Open Heart Surgery;  Laterality: [name];   TONSILLECTOMY      There were no vitals filed for this visit.   Subjective Assessment - 10/03/21 1451     Subjective Pt reports no falls or LOB since previous visit. Reports she has been generally les impulsive and has resulted in fewer falls.    Pertinent History Pt presented to [facility] 06/24/21 c c/c of slurred speech, L facial droop & LUE weakness. MRI revealed acute R pontine perforator infarct with chronic R parietal & occipital infarcts. PMH: HTN, HLD, DM, tobacco abuse, PAD, CAD, CABG, dCHF, peptic foot drop, renal artery stenosis, MI and CAD (s/p of R CEA 2017), PTSD, adjustment disorder, obesity, Left hip OA s/p OPPT 2020. Pt DC 06/28/21 to inpatient rehab in [place], [place] to home 08/01/21. AT CIR pt worked extensively on STS with MinA or Supervision and HW, Stand and squat pivot transfers with MinA, AMB progressed to ~68ft max, mostly with HW (some RW use with fixed LUE on handle), but was mostly limited in further progression of distance 2/2 chronic PAD ischaemic  pain or Left knee buckling despite AFO use. CIR recommending update to more supporting AFO at transfer to OPPT. Since home, pt is using WC to navigate the environment, family assists with ADL performance, WC still needs to be modified to fit through BR doorframe. Pt reports not feeling safe with HW at present. Husband has been helping with gnelte stretching in mornings as pt sleeps in a flexion synergy posture on LLE.    Limitations Walking;Standing;House hold activities    How long can you sit comfortably? Generally well tolerated with occasional buttocks soreness    How long can you stand comfortably? At eval: tolerates <2 minutes prior to ischemic leg pain    How long can you walk comfortably? Longest distance at CIR last month ~64ft.    Patient Stated Goals Return indpendent ambulation    Currently in Pain? Yes    Pain Score 3     Pain Location Arm  Pain Orientation Left    Pain Descriptors / Indicators Aching;Sore    Pain Type Chronic pain    Pain Onset More than a month ago              Treatment provided this session  Therex:   Soccer ball kicks 2 x 10  Left with 2# ankle weight -some attempts pt able to obtain good quad contraction, but has trouble with kicks when rolled outside normal range.   LAQ 2 x 10 Left  -cues for holds with end range knee extension, min unintentional knee bouncing at end range  HS curls RTB 2 x 10 Left  -cues for max knee flexion    There Act:    Ambulation- sidestepping left and right approximately 12 feet each   Ambulation- sidestepping, retro walking and normal walking (6 feet each) x 2 repetitions -Hemi walker use -first attempt 2 LOB recovered via minA from author when performing retro walking  -second attempt no LOB noted   Ambulation: walking with hemi walker and turn around 50 feet of total distance x 2 -no LOB noted, cues to avoid L LE contact with obstacles, despite cues pt still hit obstacles with R LE when turning  around -cues for proper use of hemiwalker, with min cues pt displayed improved control - step to gait pattern utilizing hemiwalker  Ambulation with sidestepping and retrowalking from mat table to [facility] -cues for when to sidestep and retro walk -no LOB noted   Unless otherwise stated, CGA was provided and gait belt donned in order to ensure pt safety for the above therapeutic activities   Pt educated throughout session about proper posture and technique with exercises. Improved exercise technique, movement at target joints, use of target muscles after min to mod verbal, visual, tactile cues. Pt required occasional rest breaks due fatigue, PT was quick to ask when pt appeared to be fatiguing in order to prevent excessive fatigue.                          PT Education - 10/03/21 1452     Education Details Exc forma nd technique    Person(s) Educated Patient    Methods Explanation;Demonstration    Comprehension Verbalized understanding;Returned demonstration;Verbal cues required;Tactile cues required;Need further instruction              PT Short Term Goals - 09/18/21 1623       PT SHORT TERM GOAL #1   Title Pt to demonstrate improved tolerance to standing >3 minutes without physical assist or LOB. DME ad lib    Baseline <2 minutes at eval    Time 4    Period Weeks    Status Achieved    Target Date 08/31/21      PT SHORT TERM GOAL #2   Title Pt to demonstrate perfromance of 5xSTS c AFO/Hemi walker at supervision level independence without LOB or physical assist.    Baseline Needs minGuard to MinA to perform at eval, CGA level, not safe at supervision level at this point, 44.55 sec    Time 4    Period Weeks    Status On-going    Target Date 08/31/21      PT SHORT TERM GOAL #3   Title Pt to demonstrate AMB ~51ft at supervision level with AFO, HW without LOB.    Baseline Not met, CGA required for safety    Time 6    Period Weeks  Status On-going     Target Date 09/14/21      PT SHORT TERM GOAL #4   Title Pt to demonstrate TUG test <120sec c HW and AFO at minguard - MinA level to improve independence with transfers and reduce falls risk at home.    Baseline No tug performed at eval, 100.59 sec with CGA    Time 7    Period Weeks    Status Achieved    Target Date 09/21/21               PT Long Term Goals - 09/18/21 1634       PT LONG TERM GOAL #1   Title Pt to demonstrate perfromance of 5xSTS c AFO/Hemi walker at supervision level independence without LOB or physical assist in less than 20sec.    Baseline Unable at eval, requires minguard to minA, PN 9/26: 44.5 sec with CGA for sfety    Time 8    Period Weeks    Status On-going    Target Date 11/14/21      PT LONG TERM GOAL #2   Title Pt to demonstrate FOTO score improvement >10 points to demonstrate improved perception of ability in ADL    Baseline 23 at eval:53 at PN, revised to increase by additional 10    Time 8    Period Weeks    Status Revised    Target Date 11/14/21      PT LONG TERM GOAL #3   Title Pt able to demonstrate stand pivot transfer with HW and AFO without LOB at supervision level to modI.    Baseline SBA for safety    Time 8    Period Weeks    Status On-going    Target Date 11/14/21      PT LONG TERM GOAL #4   Title Pt to demonstrate TUG test <60sec c HW and AFO at supervision level to improve independence with transfers and reduce falls risk at home.    Baseline Tug in 100.59 sec    Time 8    Period Weeks    Status New    Target Date 11/14/21                   Plan - 10/03/21 1457     Clinical Impression Statement Patient responded well to physical therapy activities performed in today's session. Patient demonstrated improved ability to activate quad muscle with soccer ball kicks and was better. Patient continues to demonstrate improved trunk and core stability in both standing and sitting. Patient still has difficulty with  direction change with movement and has difficulty moving in various planes of motion particularly frontal plane movements cause difficulty. We will continue to benefit from skilled physical therapy intervention in order to improve her balance, improve her ambulatory capacity, improve her safety with transfers, and improve her overall quality of life.    Personal Factors and Comorbidities Age;Comorbidity 3+;Fitness;Past/Current Experience;Time since onset of injury/illness/exacerbation    Comorbidities DM, HTN, PVD, clotting disorder, CAD    Examination-Activity Limitations Squat;Stairs;Bathing;Bed Mobility;Dressing;Reach Overhead;Hygiene/Grooming;Bend;Carry;Locomotion Level;Transfers;Toileting;Stand    Examination-Participation Restrictions Cleaning;Community Activity;Occupation;Laundry;Driving;Yard Work    [employer] Evolving/Moderate complexity    Rehab Potential Good    PT Frequency 2x / week    PT Duration 12 weeks    PT Treatment/Interventions Electrical Stimulation;Gait training;Stair training;Functional mobility training;Therapeutic activities;Therapeutic exercise;Balance training;Neuromuscular re-education;Patient/family education;Manual techniques;Wheelchair mobility training;Energy conservation;Passive range of motion;Orthotic Fit/Training;DME Instruction;ADLs/Self Care Home Management    PT Next Visit Plan  work toward supervision level STS and SPT c HW, sustained standing tolerance.    PT Home Exercise Plan Not updated this visit- Encouraged seated LE strengthening and practicing gait with husband.;  No updates    Consulted and Agree with Plan of Care Patient;Family member/caregiver    Family Member Consulted husband             Patient will benefit from skilled therapeutic intervention in order to improve the following deficits and impairments:  Decreased endurance, Decreased mobility, Difficulty walking, Hypomobility, Decreased balance, Abnormal gait,  Increased muscle spasms, Cardiopulmonary status limiting activity, Impaired flexibility, Hypermobility, Decreased strength, Decreased activity tolerance  Visit Diagnosis: Abnormality of gait and mobility  Difficulty in walking, not elsewhere classified  Muscle weakness (generalized)  Other abnormalities of gait and mobility  Unsteadiness on feet     Problem List Patient Active Problem List   Diagnosis Date Noted   Cerebrovascular accident (CVA) due to occlusion of vertebral artery (HCC) 09/11/2021   Cognitive change 09/11/2021   Cognitive communication deficit 08/10/2021   Urinary incontinence 08/10/2021   GERD (gastroesophageal reflux disease) 08/03/2021   Chronic post-traumatic stress disorder (PTSD)    Depression with anxiety    Right pontine stroke (HCC) 06/28/2021   Hemiplegia and hemiparesis following cerebral infarction affecting left non-dominant side (HCC) 06/24/2021   Chronic diastolic CHF (congestive heart failure) (HCC) 06/24/2021   Fall 06/24/2021   Abnormal LFTs 06/24/2021   Olecranon bursitis of left elbow 06/12/2021   Chronic hip pain (Bilateral) 06/09/2021   Chronic use of opiate for therapeutic purpose 05/09/2021   Greater trochanteric bursitis of hip (Left) 03/09/2021   Bursitis of hip (Left) 02/23/2021   Uncomplicated opioid dependence (HCC) 02/20/2021   Acute conjunctivitis of left eye 01/02/2021   Unintentional weight loss 11/21/2020   Broken teeth (Right) 08/02/2020   History of MI (myocardial infarction) (July 2017) 08/02/2020   Neuropathy 06/09/2020   Dental abscess 06/09/2020   Foot drop (Left) 06/09/2020   Carotid stenosis, asymptomatic, right 04/27/2020   Chronic migraine 11/03/2019   Thrombocytosis 09/16/2019   Erythrocytosis 09/16/2019   Hypercalcemia 09/06/2019   Leukocytosis 09/06/2019   Polycythemia 09/06/2019   Chronic hip pain (Left) 08/27/2019   Osteoarthritis of hip (Left) 08/27/2019   Gluteal tendonitis of buttock (Left)  08/27/2019   Radial nerve palsy (Right) 07/15/2019   Neuropathy of radial nerve (Right) 06/30/2019   Abnormal bruising 06/25/2019   History of carotid endarterectomy (Right) 03/04/2019   Pain medication agreement signed 03/04/2019   Atypical facial pain (Right) 01/27/2019   Chronic ear pain (Right) 01/27/2019   Chronic jaw pain (Right) 01/27/2019   Geniculate Neuralgia (Right) 01/27/2019   Vitamin D deficiency 01/19/2019   Neurogenic pain 01/19/2019   Chronic anticoagulation (PLAVIX) 01/19/2019   Chronic pain syndrome 01/07/2019   Long term current use of opiate analgesic 01/07/2019   Long term prescription benzodiazepine use 01/07/2019   Pharmacologic therapy 01/07/2019   Disorder of skeletal system 01/07/2019   Problems influencing health status 01/07/2019   Chronic headaches (1ry area of Pain) (Right) 01/07/2019   Opiate use 09/24/2018   PVD (peripheral vascular disease) (HCC) 06/24/2018   Chronic ankle pain (Bilateral) 12/27/2017   Tendinopathy of gluteus medius (Right) 12/27/2017   Tendinopathy of gluteus medius (Left) 12/27/2017   Bilateral hip pain 12/26/2017   Chronic elbow pain (Left) 10/17/2017   Elevated troponin I level 05/21/2017   Major depressive disorder, recurrent episode, moderate (HCC) 11/19/2016   Insomnia 10/30/2016   Constipation 07/25/2016  S/P CABG x 4 07/06/2016   Bradycardia    CAD (coronary artery disease)    Carotid stenosis    CAD in native artery 06/29/2016   Elevated troponin 06/28/2016   Essential hypertension, malignant 06/28/2016   Mild tobacco abuse in early remission 06/28/2016   Essential hypertension    Malignant hypertension    Type 2 diabetes mellitus with other specified complication (HCC)    Chest pain with high risk for cardiac etiology 06/27/2016   NSTEMI (non-ST elevated myocardial infarction) ([place]) 06/27/2016   Proteinuria 03/15/2016   Renal artery stenosis ([place]) 03/15/2016   MDD (major depressive disorder) 10/17/2015    Agoraphobia with panic attacks 04/25/2015   HTN (hypertension), malignant 10/20/2013   Cluster headache 03/20/2012   HLD (hyperlipidemia) 07/26/2010   ADJUSTMENT DISORDER WITH MIXED FEATURES 07/26/2010    Particia Lather, PT 10/04/2021, 8:06 AM  [facility] [address] [place], [place], [address] Phone: [phone]   Fax:  [phone]  Name: Erika [name] MRN: [id number] Date of Birth: [birth year]

## 2021-10-04 NOTE — Therapy (Signed)
[place] [name] [address] [place], [place], [address] Phone: [phone]   Fax:  [phone]  Occupational Therapy Treatment  Patient Details  Name: Erika [name] MRN: [id number] Date of Birth: 12-05-1971 Referring Provider (OT): Dr. Leeroy Cha   Encounter Date: 10/03/2021   OT End of Session - 10/04/21 0806     Visit Number 13    Number of Visits 20    Date for OT Re-Evaluation 10/25/21    Authorization Type limited to 20 visits combined/multidiscipline    Authorization Time Period Reporting period starting 08/03/2021    OT Start Time 1600    OT Stop Time 1645    OT Time Calculation (min) 45 min    Equipment Utilized During Treatment wc    Activity Tolerance Patient limited by lethargy    Behavior During Therapy [facility] for tasks assessed/performed             Past Medical History:  Diagnosis Date   Arthralgia of temporomandibular joint    CAD, multiple vessel    a. 06/2016 Cath: ostLM 40%, ostLAD 40%, pLAD 95%, ost-pLCx 60%, pLCx 95%, mLCx 60%, mRCA 95%, D2 50%, LVSF nl;  b. 07/2016 CABG x 4 (LIMA->LAD, VG->Diag, VG->OM, VG->RCA); c. 08/2016 Cath: 3VD w/ 4/4 patent grafts. LAD distal to LIMA has diff dzs->Med rx; d. 08/2020 Cath: 4/4 patent grafts, native 3VD. EF 55-65%-->Med Rx.   Carotid arterial disease ([place])    a. 07/2016 s/p R CEA; b. 02/2021 U/S: RICA 16-38%, LICA 4-53%.   Clotting disorder ([place])    Depression    Diastolic dysfunction    a. 06/2016 Echo: EF 50-55%, mild inf wall HK, GR1DD, mild MR, RV sys fxn nl, mildly dilated LA, PASP nl   Fatty liver disease, nonalcoholic 6468   History of blood transfusion    with heart surgery   HLD (hyperlipidemia)    Labile hypertension    a. prior renal ngiogram negative for RAS in 03/2016; b. catecholamines and metanephrines normal, mildly elevated renin with normal aldosterone and normal ratio in 02/2016   Myocardial infarction [facility]) 2017   Obesity    PAD  (peripheral artery disease) ([place])    a. 09/2018 s/p L SFA stenting; b. 07/2019 Periph Angio: Patent m/d L SFA stent w/ 100% L SFA distal to stent. L AT 100d, L Peroneal diff dzs-->Med Rx; c. 02/2021 ABIs: stable @ 0.61 on R and 0.46 on L.   PTSD (post-traumatic stress disorder)    Tobacco abuse    Type 2 diabetes mellitus ([place]) 12/2015    Past Surgical History:  Procedure Laterality Date   ABDOMINAL AORTOGRAM W/LOWER EXTREMITY [name] 10/15/2018   Procedure: ABDOMINAL AORTOGRAM W/LOWER EXTREMITY;  Surgeon: Wellington Hampshire, MD;  Location: [place] [facility];  Service: Cardiovascular;  Laterality: [name];   ABDOMINAL AORTOGRAM W/LOWER EXTREMITY Bilateral 08/19/2019   Procedure: ABDOMINAL AORTOGRAM W/LOWER EXTREMITY;  Surgeon: Wellington Hampshire, MD;  Location: [place] [facility];  Service: Cardiovascular;  Laterality: Bilateral;   CARDIAC CATHETERIZATION [name] 06/29/2016   Procedure: Left Heart Cath and Coronary Angiography;  Surgeon: Minna Merritts, MD;  Location: [place] [facility];  Service: Cardiovascular;  Laterality: [name];   CARDIAC CATHETERIZATION [name] 08/29/2016   Procedure: Left Heart Cath and Cors/Grafts Angiography;  Surgeon: Wellington Hampshire, MD;  Location: [place] [facility];  Service: Cardiovascular;  Laterality: [name];   CESAREAN SECTION     CHOLECYSTECTOMY     CORONARY ARTERY BYPASS  GRAFT [name] 07/06/2016   Procedure: CORONARY ARTERY BYPASS GRAFTING (CABG) x four, using left internal mammary artery and right leg greater saphenous vein harvested endoscopically;  Surgeon: Ivin Poot, MD;  Location: [place];  Service: Open Heart Surgery;  Laterality: [name];   ENDARTERECTOMY Right 07/06/2016   Procedure: ENDARTERECTOMY CAROTID;  Surgeon: Rosetta Posner, MD;  Location: [facility] OR;  Service: Vascular;  Laterality: Right;   ENDARTERECTOMY Right 04/27/2020   Procedure: REDO OF RIGHT ENDARTERECTOMY CAROTID;  Surgeon: Rosetta Posner, MD;  Location: [facility] OR;  Service: Vascular;  Laterality: Right;   LEFT HEART CATH  AND CORS/GRAFTS ANGIOGRAPHY [name] 08/24/2020   Procedure: LEFT HEART CATH AND CORS/GRAFTS ANGIOGRAPHY;  Surgeon: Wellington Hampshire, MD;  Location: [place] [facility];  Service: Cardiovascular;  Laterality: [name];   PERIPHERAL VASCULAR CATHETERIZATION [name] 04/18/2016   Procedure: Renal Angiography;  Surgeon: Wellington Hampshire, MD;  Location: [place] [facility];  Service: Cardiovascular;  Laterality: [name];   PERIPHERAL VASCULAR INTERVENTION Left 10/15/2018   Procedure: PERIPHERAL VASCULAR INTERVENTION;  Surgeon: Wellington Hampshire, MD;  Location: [place] [facility];  Service: Cardiovascular;  Laterality: Left;  Left superficial femoral   TEE WITHOUT CARDIOVERSION [name] 07/06/2016   Procedure: TRANSESOPHAGEAL ECHOCARDIOGRAM (TEE);  Surgeon: Ivin Poot, MD;  Location: [place];  Service: Open Heart Surgery;  Laterality: [name];   TONSILLECTOMY      There were no vitals filed for this visit.   Subjective Assessment - 10/03/21 0803     Subjective  "I'm bored of just sitting in my wc all day.  I want to be able to help at home."    Patient is accompanied by: Family member    Pertinent History HX of CAD, CABG, DM2, s/p R pontine stroke    Limitations L sided hemiplegia, impulsivity, judgment, safety awareness    Patient Stated Goals "I want to be able to take care of myself."    Currently in Pain? Yes    Pain Score 3     Pain Location Arm    Pain Orientation Left    Pain Descriptors / Indicators Aching;Sore    Pain Type Chronic pain    Pain Onset More than a month ago    Pain Frequency Intermittent    Aggravating Factors  unable to sleep on L side    Pain Relieving Factors pain meds, positioning    Multiple Pain Sites No           Occupational Therapy Treatment: Neuro re-ed: Performed PROM/AAROM for LUE for shoulder flexion/ext, abd/add, horiz abd/add.  Provided muscle tapping to facilitate increased muscle contraction for bicep and wrist extensors.  PROM performed for L forearm, wrist, and hand all  planes for tone reduction and contracture prevention with good tolerance.  Reinforced HEP with pt to complete table slides daily, scapular AROM, and self PROM throughout LUE; pt verbalized understanding.  Self Care: Instructed in edema massage for L hand for edema reduction, pain management, and increased ROM; able to return demo with min vc for technique.  Reinforced importance of elevating hand with use of arm trough or pillow to reduce pain and edema in hand.  Encouraged spouse trim pt's nails today as pt reports they are starting to dig in to L palm.  Spouse will trim tonight.  Educated on various pieces of adaptive meal prep items/kitchen utensils, including spiked (one handed) cutting board, use of dycem (issued square to pt) when stirring/cutting/eating to prevent dish from sliding, rocker  knife, stand for cook book, 1 handed bottle opener.  Made a list for pt and spouse of various household activities in which pt could safely participate, including setting the table if a family members stacks dishes at table, wiping down tables and countertops, emptyting small trash baskets in bathroom, folding clothes, wiping bathroom countertops, assisting with meal prep at table top.  Reinforced importance of pt participation to promote pt's independence, self confidence/sense of purpose, and to reduce some physical burden on family.  Pt/spouse pleased with list and plan to carry over with pt helping with parts of meal prep tonight, setting table, and clean up.   Response to Treatment: See Plan/clinical impression below.    OT Education - 10/03/21 0805     Education Details edema massage to L hand; modified kitchen utensils    Person(s) Educated Patient;Spouse    Methods Explanation;Verbal cues    Comprehension Verbalized understanding;Need further instruction;Verbal cues required              OT Short Term Goals - 09/18/21 1542       OT SHORT TERM GOAL #1   Title Pt will be perform HEP for LUE  with min vc.    Baseline 10th visit: Pt. requires assist to perform Self-ROM. Eval: Initiated at eval, further instruction needed    Time 6    Period Weeks    Status On-going               OT Long Term Goals - 09/18/21 1536       OT LONG TERM GOAL #2   Title Pt will perform UB ADLs with set up    Baseline 10th vist: independent doffing a shirt, MinA donning    Time 12    Period Weeks    Status On-going    Target Date 10/25/21      OT LONG TERM GOAL #3   Title Pt will perform LB ADLs with min A    Baseline 10th visit: Pt. is able to donn pants with minA hiking the back waistband. Pt. requires maxA donning shoes, ans socks. Eval: Max A to perform LB ADLs    Time 12    Period Weeks    Status On-going    Target Date 10/25/21      OT LONG TERM GOAL #4   Title Pt will perform ADL transfers with distant supv    Baseline 10th visit: CGA. Eval: min A-mod A to perform ADL transfers    Time 12    Period Weeks    Status On-going    Target Date 10/25/21      OT LONG TERM GOAL #5   Title Pt will increase LUE strength by 2 MM grades to work towards use of LUE as a stabilizer for self care.    Baseline 10th visit: Pt. is starting to engage her left hand as a stabilizer.: L shoulder 1/5, elbow/wrist/hand 0/5.  Unable to use LUE as a stabilzer for self care.    Time 12    Period Weeks    Status On-going    Target Date 10/25/21              Plan - 10/03/21 0827     Clinical Impression Statement Pt verbalizes being bored at home with sitting in wc all day, reporting eagerness to help at home with household management.  Pt/spouse both receptive to list of household tasks brainstormed today at eval and pt feels encouraged with the ideas  in which she can contribute to keeping up the house.  Pt presented today with mild flexor tone in distal LUE, 1+ edema in L hand.  Reinforced positioning strategies, edema massage, and tone reduction/pain management strategies for distal LUE with  good return demo.  Will continue to address HEP, positioning of LUE, and ADL/IADL tasks in order to maximize safety and indep in the home, while managing pain and tone throughout LUE.    OT Occupational Profile and History Detailed Assessment- Review of Records and additional review of physical, cognitive, psychosocial history related to current functional performance    Occupational performance deficits (Please refer to evaluation for details): ADL's;IADL's;Leisure;Work    Marketing executive / Function / Physical Skills ADL;Continence;Dexterity;Strength;Vision;Balance;Coordination;FMC;IADL;Body mechanics;Endurance;Gait;UE functional use;Decreased knowledge of use of DME;GMC;Mobility    Rehab Potential Good    Clinical Decision Making Several treatment options, min-mod task modification necessary    Comorbidities Affecting Occupational Performance: May have comorbidities impacting occupational performance    Modification or Assistance to Complete Evaluation  Min-Moderate modification of tasks or assist with assess necessary to complete eval    OT Frequency 2x / week    OT Duration 12 weeks    OT Treatment/Interventions Self-care/ADL training;Therapeutic exercise;DME and/or AE instruction;Functional Mobility Training;Balance training;Electrical Stimulation;Neuromuscular education;Manual Therapy;Splinting;Visual/perceptual remediation/compensation;Moist Heat;Passive range of motion;Therapeutic activities;Patient/family education    Consulted and Agree with Plan of Care Patient;Family member/caregiver    Family Member Consulted spouse, [name]             Patient will benefit from skilled therapeutic intervention in order to improve the following deficits and impairments:   Body Structure / Function / Physical Skills: ADL, Continence, Dexterity, Strength, Vision, Balance, Coordination, FMC, IADL, Body mechanics, Endurance, Gait, UE functional use, Decreased knowledge of use of DME, [place], Mobility        Visit Diagnosis: Muscle weakness (generalized)  Other lack of coordination  Hemiplegia and hemiparesis following cerebral infarction affecting left non-dominant side [facility])    Problem List Patient Active Problem List   Diagnosis Date Noted   Cerebrovascular accident (CVA) due to occlusion of vertebral artery ([place]) 09/11/2021   Cognitive change 09/11/2021   Cognitive communication deficit 08/10/2021   Urinary incontinence 08/10/2021   GERD (gastroesophageal reflux disease) 08/03/2021   Chronic post-traumatic stress disorder (PTSD)    Depression with anxiety    Right pontine stroke ([place]) 06/28/2021   Hemiplegia and hemiparesis following cerebral infarction affecting left non-dominant side ([place]) 06/24/2021   Chronic diastolic CHF (congestive heart failure) ([place]) 06/24/2021   Fall 06/24/2021   Abnormal LFTs 06/24/2021   Olecranon bursitis of left elbow 06/12/2021   Chronic hip pain (Bilateral) 06/09/2021   Chronic use of opiate for therapeutic purpose 05/09/2021   Greater trochanteric bursitis of hip (Left) 03/09/2021   Bursitis of hip (Left) 73/53/2992   Uncomplicated opioid dependence ([place]) 02/20/2021   Acute conjunctivitis of left eye 01/02/2021   Unintentional weight loss 11/21/2020   Broken teeth (Right) 08/02/2020   History of MI (myocardial infarction) (July 2017) 08/02/2020   Neuropathy 06/09/2020   Dental abscess 06/09/2020   Foot drop (Left) 06/09/2020   Carotid stenosis, asymptomatic, right 04/27/2020   Chronic migraine 11/03/2019   Thrombocytosis 09/16/2019   Erythrocytosis 09/16/2019   Hypercalcemia 09/06/2019   Leukocytosis 09/06/2019   Polycythemia 09/06/2019   Chronic hip pain (Left) 08/27/2019   Osteoarthritis of hip (Left) 08/27/2019   Gluteal tendonitis of buttock (Left) 08/27/2019   Radial nerve palsy (Right) 07/15/2019   Neuropathy of radial nerve (Right)  06/30/2019   Abnormal bruising 06/25/2019   History of carotid endarterectomy (Right)  03/04/2019   Pain medication agreement signed 03/04/2019   Atypical facial pain (Right) 01/27/2019   Chronic ear pain (Right) 01/27/2019   Chronic jaw pain (Right) 01/27/2019   Geniculate Neuralgia (Right) 01/27/2019   Vitamin D deficiency 01/19/2019   Neurogenic pain 01/19/2019   Chronic anticoagulation (PLAVIX) 01/19/2019   Chronic pain syndrome 01/07/2019   Long term current use of opiate analgesic 01/07/2019   Long term prescription benzodiazepine use 01/07/2019   Pharmacologic therapy 01/07/2019   Disorder of skeletal system 01/07/2019   Problems influencing health status 01/07/2019   Chronic headaches (1ry area of Pain) (Right) 01/07/2019   Opiate use 09/24/2018   PVD (peripheral vascular disease) ([place]) 06/24/2018   Chronic ankle pain (Bilateral) 12/27/2017   Tendinopathy of gluteus medius (Right) 12/27/2017   Tendinopathy of gluteus medius (Left) 12/27/2017   Bilateral hip pain 12/26/2017   Chronic elbow pain (Left) 10/17/2017   Elevated troponin I level 05/21/2017   Major depressive disorder, recurrent episode, moderate (HCC) 11/19/2016   Insomnia 10/30/2016   Constipation 07/25/2016   S/P CABG x 4 07/06/2016   Bradycardia    CAD (coronary artery disease)    Carotid stenosis    CAD in native artery 06/29/2016   Elevated troponin 06/28/2016   Essential hypertension, malignant 06/28/2016   Mild tobacco abuse in early remission 06/28/2016   Essential hypertension    Malignant hypertension    Type 2 diabetes mellitus with other specified complication ([place])    Chest pain with high risk for cardiac etiology 06/27/2016   NSTEMI (non-ST elevated myocardial infarction) ([place]) 06/27/2016   Proteinuria 03/15/2016   Renal artery stenosis ([place]) 03/15/2016   MDD (major depressive disorder) 10/17/2015   Agoraphobia with panic attacks 04/25/2015   HTN (hypertension), malignant 10/20/2013   Cluster headache 03/20/2012   HLD (hyperlipidemia) 07/26/2010   ADJUSTMENT DISORDER WITH  MIXED FEATURES 07/26/2010   Leta Speller, MS, OTR/L  Darleene Cleaver, OT/L 10/04/2021, 8:27 AM  [place] [address] [place], [place], [address] Phone: [phone]   Fax:  [phone]  Name: Erika [name] MRN: [id number] Date of Birth: 17-Oct-1971

## 2021-10-05 ENCOUNTER — Ambulatory Visit: Payer: Managed Care, Other (non HMO) | Admitting: Physical Therapy

## 2021-10-05 ENCOUNTER — Ambulatory Visit: Payer: Managed Care, Other (non HMO)

## 2021-10-05 ENCOUNTER — Ambulatory Visit: Payer: Managed Care, Other (non HMO) | Admitting: Speech Pathology

## 2021-10-05 ENCOUNTER — Ambulatory Visit: Payer: Managed Care, Other (non HMO) | Admitting: Occupational Therapy

## 2021-10-09 ENCOUNTER — Other Ambulatory Visit: Payer: Self-pay

## 2021-10-09 MED ORDER — TORSEMIDE 10 MG PO TABS: 10.0000 mg | ORAL_TABLET | Freq: Every day | ORAL | 0 refills | Status: DC

## 2021-10-10 ENCOUNTER — Ambulatory Visit: Payer: Managed Care, Other (non HMO) | Admitting: Occupational Therapy

## 2021-10-10 ENCOUNTER — Ambulatory Visit: Payer: Managed Care, Other (non HMO) | Admitting: Physical Therapy

## 2021-10-12 ENCOUNTER — Encounter: Payer: Managed Care, Other (non HMO) | Admitting: Occupational Therapy

## 2021-10-12 ENCOUNTER — Ambulatory Visit: Payer: Managed Care, Other (non HMO) | Admitting: Physical Therapy

## 2021-10-12 ENCOUNTER — Encounter: Payer: Managed Care, Other (non HMO) | Admitting: Speech Pathology

## 2021-10-16 ENCOUNTER — Other Ambulatory Visit: Payer: Self-pay | Admitting: Physical Medicine & Rehabilitation

## 2021-10-17 ENCOUNTER — Ambulatory Visit: Payer: Managed Care, Other (non HMO) | Admitting: Physical Therapy

## 2021-10-17 ENCOUNTER — Encounter: Payer: Managed Care, Other (non HMO) | Admitting: Occupational Therapy

## 2021-10-19 ENCOUNTER — Ambulatory Visit: Payer: Managed Care, Other (non HMO)

## 2021-10-19 ENCOUNTER — Other Ambulatory Visit: Payer: Self-pay

## 2021-10-19 ENCOUNTER — Ambulatory Visit: Payer: Managed Care, Other (non HMO) | Admitting: Speech Pathology

## 2021-10-19 ENCOUNTER — Ambulatory Visit: Payer: Managed Care, Other (non HMO) | Admitting: Occupational Therapy

## 2021-10-19 DIAGNOSIS — M6281 Muscle weakness (generalized): Secondary | ICD-10-CM | POA: Diagnosis not present

## 2021-10-19 DIAGNOSIS — R269 Unspecified abnormalities of gait and mobility: Secondary | ICD-10-CM

## 2021-10-19 DIAGNOSIS — R2681 Unsteadiness on feet: Secondary | ICD-10-CM

## 2021-10-19 DIAGNOSIS — R278 Other lack of coordination: Secondary | ICD-10-CM

## 2021-10-19 DIAGNOSIS — R41841 Cognitive communication deficit: Secondary | ICD-10-CM

## 2021-10-19 DIAGNOSIS — R471 Dysarthria and anarthria: Secondary | ICD-10-CM

## 2021-10-19 NOTE — Therapy (Signed)
[place] MAIN [facility] SERVICES [address] [place], [place], [address] Phone: [phone]   Fax:  [phone]  Occupational Therapy Treatment  Patient Details  Name: Erika [name] MRN: [id number] Date of Birth: 11-13-1971 Referring Provider (OT): Dr. Leeroy Cha   Encounter Date: 10/19/2021   OT End of Session - 10/19/21 1705     Visit Number 14    Number of Visits 20    Date for OT Re-Evaluation 10/25/21    Authorization Type limited to 20 visits combined/multidiscipline    OT Start Time 1600    OT Stop Time 1645    OT Time Calculation (min) 45 min    Activity Tolerance Patient limited by lethargy    Behavior During Therapy [facility] for tasks assessed/performed;Impulsive             Past Medical History:  Diagnosis Date   Arthralgia of temporomandibular joint    CAD, multiple vessel    a. 06/2016 Cath: ostLM 40%, ostLAD 40%, pLAD 95%, ost-pLCx 60%, pLCx 95%, mLCx 60%, mRCA 95%, D2 50%, LVSF nl;  b. 07/2016 CABG x 4 (LIMA->LAD, VG->Diag, VG->OM, VG->RCA); c. 08/2016 Cath: 3VD w/ 4/4 patent grafts. LAD distal to LIMA has diff dzs->Med rx; d. 08/2020 Cath: 4/4 patent grafts, native 3VD. EF 55-65%-->Med Rx.   Carotid arterial disease ([place])    a. 07/2016 s/p R CEA; b. 02/2021 U/S: RICA 50-09%, LICA 3-81%.   Clotting disorder ([place])    Depression    Diastolic dysfunction    a. 06/2016 Echo: EF 50-55%, mild inf wall HK, GR1DD, mild MR, RV sys fxn nl, mildly dilated LA, PASP nl   Fatty liver disease, nonalcoholic 8299   History of blood transfusion    with heart surgery   HLD (hyperlipidemia)    Labile hypertension    a. prior renal ngiogram negative for RAS in 03/2016; b. catecholamines and metanephrines normal, mildly elevated renin with normal aldosterone and normal ratio in 02/2016   Myocardial infarction [facility]) 2017   Obesity    PAD (peripheral artery disease) ([place])    a. 09/2018 s/p L SFA stenting; b. 07/2019 Periph Angio: Patent  m/d L SFA stent w/ 100% L SFA distal to stent. L AT 100d, L Peroneal diff dzs-->Med Rx; c. 02/2021 ABIs: stable @ 0.61 on R and 0.46 on L.   PTSD (post-traumatic stress disorder)    Tobacco abuse    Type 2 diabetes mellitus ([place]) 12/2015    Past Surgical History:  Procedure Laterality Date   ABDOMINAL AORTOGRAM W/LOWER EXTREMITY [name] 10/15/2018   Procedure: ABDOMINAL AORTOGRAM W/LOWER EXTREMITY;  Surgeon: Wellington Hampshire, MD;  Location: [place] CV LAB;  Service: Cardiovascular;  Laterality: [name];   ABDOMINAL AORTOGRAM W/LOWER EXTREMITY Bilateral 08/19/2019   Procedure: ABDOMINAL AORTOGRAM W/LOWER EXTREMITY;  Surgeon: Wellington Hampshire, MD;  Location: [place] [facility] LAB;  Service: Cardiovascular;  Laterality: Bilateral;   CARDIAC CATHETERIZATION [name] 06/29/2016   Procedure: Left Heart Cath and Coronary Angiography;  Surgeon: Minna Merritts, MD;  Location: [place] [facility] LAB;  Service: Cardiovascular;  Laterality: [name];   CARDIAC CATHETERIZATION [name] 08/29/2016   Procedure: Left Heart Cath and Cors/Grafts Angiography;  Surgeon: Wellington Hampshire, MD;  Location: [place] [facility] LAB;  Service: Cardiovascular;  Laterality: [name];   CESAREAN SECTION     CHOLECYSTECTOMY     CORONARY ARTERY BYPASS GRAFT [name] 07/06/2016   Procedure: CORONARY ARTERY BYPASS GRAFTING (CABG) x four, using left internal mammary artery  and right leg greater saphenous vein harvested endoscopically;  Surgeon: Ivin Poot, MD;  Location: [place];  Service: Open Heart Surgery;  Laterality: [name];   ENDARTERECTOMY Right 07/06/2016   Procedure: ENDARTERECTOMY CAROTID;  Surgeon: Rosetta Posner, MD;  Location: [facility] OR;  Service: Vascular;  Laterality: Right;   ENDARTERECTOMY Right 04/27/2020   Procedure: REDO OF RIGHT ENDARTERECTOMY CAROTID;  Surgeon: Rosetta Posner, MD;  Location: [facility] OR;  Service: Vascular;  Laterality: Right;   LEFT HEART CATH AND CORS/GRAFTS ANGIOGRAPHY [name] 08/24/2020   Procedure: LEFT HEART CATH AND CORS/GRAFTS ANGIOGRAPHY;   Surgeon: Wellington Hampshire, MD;  Location: [place] [facility] LAB;  Service: Cardiovascular;  Laterality: [name];   PERIPHERAL VASCULAR CATHETERIZATION [name] 04/18/2016   Procedure: Renal Angiography;  Surgeon: Wellington Hampshire, MD;  Location: [place] [facility] LAB;  Service: Cardiovascular;  Laterality: [name];   PERIPHERAL VASCULAR INTERVENTION Left 10/15/2018   Procedure: PERIPHERAL VASCULAR INTERVENTION;  Surgeon: Wellington Hampshire, MD;  Location: [place] [facility] LAB;  Service: Cardiovascular;  Laterality: Left;  Left superficial femoral   TEE WITHOUT CARDIOVERSION [name] 07/06/2016   Procedure: TRANSESOPHAGEAL ECHOCARDIOGRAM (TEE);  Surgeon: Ivin Poot, MD;  Location: [place];  Service: Open Heart Surgery;  Laterality: [name];   TONSILLECTOMY      There were no vitals filed for this visit.   Subjective Assessment - 10/19/21 1704     Subjective  Pt. reports doing well today.    Patient is accompanied by: Family member    Pertinent History HX of CAD, CABG, DM2, s/p R pontine stroke    Limitations L sided hemiplegia, impulsivity, judgment, safety awareness    Currently in Pain? Yes    Pain Score 4     Pain Location Wrist    Pain Orientation Left    Pain Descriptors / Indicators Aching    Pain Type Chronic pain            OT TREATMENT     Therapeutic Exercise:   Pt. tolerated PROM in all joint ranges for left shoulder flexion, extension, abduction, horizontal abduction, and adduction, elbow flexion, extension, wrist flexion, and extension, thumb abduction. Digit flexion, and extension. Pt. Attempted writ extension with facilitory tapping at the extensor muscles. Pt. performed AAROM for hand to face patterns.    Neuromuscular re-education:   Pt. worked on alternating weightbearing through her LUE, and hand with facilitation of the wrist, and digit extensors.     Manual Therapy:   Pt. tolerated scapular mobilizations for elevation, depression, abduction/rotation.  Pt. tolerated soft tissue  mobilizations for metacarpal spread stretches to the left hand. Manual therapy was performed independent of, and in preparation for ther. Ex.   Pt. reports that she is now assisting more in the kitchen at home, and helping with meal preparation from tabletop surface height. Pt. reports 4/10  left dorsal wrist pain initially with 2nd digit flexion. However subsided once during ROM. Pt. required MinA to transfer from the mat to the w/c with minA with positive left knee buckling when transferring back to the chair requiring the knee to be blocked during the transfer.  Pt. tolerated ROM, and manual therapy well today. Pt. Demonstrated consistent isolated active initiation of digit flexion, and trace extension movements in the left hand. Pt. was able to demonstrate self-ROM to the left hand using the right hand with visual, verbal, and tactile cues. Pt. education was provided about finding opportunities to engage the LUE as a gross assist during daily  activities. Pt. continues to work on improving and maximizing independence with ADLs, and IADL tasks.                            OT Education - 10/19/21 1705     Education Details edema massage to L hand; modified kitchen utensils    Person(s) Educated Patient;Spouse    Methods Explanation;Verbal cues    Comprehension Verbalized understanding;Need further instruction;Verbal cues required              OT Short Term Goals - 09/18/21 1542       OT SHORT TERM GOAL #1   Title Pt will be perform HEP for LUE with min vc.    Baseline 10th visit: Pt. requires assist to perform Self-ROM. Eval: Initiated at eval, further instruction needed    Time 6    Period Weeks    Status On-going               OT Long Term Goals - 09/18/21 1536       OT LONG TERM GOAL #2   Title Pt will perform UB ADLs with set up    Baseline 10th vist: independent doffing a shirt, MinA donning    Time 12    Period Weeks    Status On-going    Target  Date 10/25/21      OT LONG TERM GOAL #3   Title Pt will perform LB ADLs with min A    Baseline 10th visit: Pt. is able to donn pants with minA hiking the back waistband. Pt. requires maxA donning shoes, ans socks. Eval: Max A to perform LB ADLs    Time 12    Period Weeks    Status On-going    Target Date 10/25/21      OT LONG TERM GOAL #4   Title Pt will perform ADL transfers with distant supv    Baseline 10th visit: CGA. Eval: min A-mod A to perform ADL transfers    Time 12    Period Weeks    Status On-going    Target Date 10/25/21      OT LONG TERM GOAL #5   Title Pt will increase LUE strength by 2 MM grades to work towards use of LUE as a stabilizer for self care.    Baseline 10th visit: Pt. is starting to engage her left hand as a stabilizer.: L shoulder 1/5, elbow/wrist/hand 0/5.  Unable to use LUE as a stabilzer for self care.    Time 12    Period Weeks    Status On-going    Target Date 10/25/21                   Plan - 10/19/21 1705     Clinical Impression Statement Pt. reports that she is now assisting more in the kitchen at home, and helping with meal preparation from tabletop surface height. Pt. reports 4/10  left dorsal wrist pain initially with 2nd digit flexion. However subsided once during ROM. Pt. required MinA to transfer from the mat to the w/c with minA with positive left knee buckling when transferring back to the chair requiring the knee to be blocked during the transfer.  Pt. tolerated ROM, and manual therapy well today. Pt. Demonstrated consistent isolated active initiation of digit flexion, and trace extension movements in the left hand. Pt. was able to demonstrate self-ROM to the left hand using the right hand with  visual, verbal, and tactile cues. Pt. education was provided about finding opportunities to engage the LUE as a gross assist during daily activities. Pt. continues to work on improving and maximizing independence with ADLs, and IADL tasks.           OT Occupational Profile and History Detailed Assessment- Review of Records and additional review of physical, cognitive, psychosocial history related to current functional performance    Occupational performance deficits (Please refer to evaluation for details): ADL's;IADL's;Leisure;Work    Marketing executive / Function / Physical Skills ADL;Continence;Dexterity;Strength;Vision;Balance;Coordination;FMC;IADL;Body mechanics;Endurance;Gait;UE functional use;Decreased knowledge of use of DME;GMC;Mobility    Rehab Potential Good    Clinical Decision Making Several treatment options, min-mod task modification necessary    Comorbidities Affecting Occupational Performance: May have comorbidities impacting occupational performance    Modification or Assistance to Complete Evaluation  Min-Moderate modification of tasks or assist with assess necessary to complete eval    OT Frequency 2x / week    OT Duration 12 weeks    OT Treatment/Interventions Self-care/ADL training;Therapeutic exercise;DME and/or AE instruction;Functional Mobility Training;Balance training;Electrical Stimulation;Neuromuscular education;Manual Therapy;Splinting;Visual/perceptual remediation/compensation;Moist Heat;Passive range of motion;Therapeutic activities;Patient/family education    Consulted and Agree with Plan of Care Patient;Family member/caregiver             Patient will benefit from skilled therapeutic intervention in order to improve the following deficits and impairments:   Body Structure / Function / Physical Skills: ADL, Continence, Dexterity, Strength, Vision, Balance, Coordination, FMC, IADL, Body mechanics, Endurance, Gait, UE functional use, Decreased knowledge of use of DME, GMC, Mobility       Visit Diagnosis: Muscle weakness (generalized)  Other lack of coordination    Problem List Patient Active Problem List   Diagnosis Date Noted   Cerebrovascular accident (CVA) due to occlusion of vertebral  artery ([place]) 09/11/2021   Cognitive change 09/11/2021   Cognitive communication deficit 08/10/2021   Urinary incontinence 08/10/2021   GERD (gastroesophageal reflux disease) 08/03/2021   Chronic post-traumatic stress disorder (PTSD)    Depression with anxiety    Right pontine stroke ([place]) 06/28/2021   Hemiplegia and hemiparesis following cerebral infarction affecting left non-dominant side ([place]) 06/24/2021   Chronic diastolic CHF (congestive heart failure) ([place]) 06/24/2021   Fall 06/24/2021   Abnormal LFTs 06/24/2021   Olecranon bursitis of left elbow 06/12/2021   Chronic hip pain (Bilateral) 06/09/2021   Chronic use of opiate for therapeutic purpose 05/09/2021   Greater trochanteric bursitis of hip (Left) 03/09/2021   Bursitis of hip (Left) 54/00/8676   Uncomplicated opioid dependence ([place]) 02/20/2021   Acute conjunctivitis of left eye 01/02/2021   Unintentional weight loss 11/21/2020   Broken teeth (Right) 08/02/2020   History of MI (myocardial infarction) (July 2017) 08/02/2020   Neuropathy 06/09/2020   Dental abscess 06/09/2020   Foot drop (Left) 06/09/2020   Carotid stenosis, asymptomatic, right 04/27/2020   Chronic migraine 11/03/2019   Thrombocytosis 09/16/2019   Erythrocytosis 09/16/2019   Hypercalcemia 09/06/2019   Leukocytosis 09/06/2019   Polycythemia 09/06/2019   Chronic hip pain (Left) 08/27/2019   Osteoarthritis of hip (Left) 08/27/2019   Gluteal tendonitis of buttock (Left) 08/27/2019   Radial nerve palsy (Right) 07/15/2019   Neuropathy of radial nerve (Right) 06/30/2019   Abnormal bruising 06/25/2019   History of carotid endarterectomy (Right) 03/04/2019   Pain medication agreement signed 03/04/2019   Atypical facial pain (Right) 01/27/2019   Chronic ear pain (Right) 01/27/2019   Chronic jaw pain (Right) 01/27/2019   Geniculate Neuralgia (Right) 01/27/2019  Vitamin D deficiency 01/19/2019   Neurogenic pain 01/19/2019   Chronic anticoagulation (PLAVIX)  01/19/2019   Chronic pain syndrome 01/07/2019   Long term current use of opiate analgesic 01/07/2019   Long term prescription benzodiazepine use 01/07/2019   Pharmacologic therapy 01/07/2019   Disorder of skeletal system 01/07/2019   Problems influencing health status 01/07/2019   Chronic headaches (1ry area of Pain) (Right) 01/07/2019   Opiate use 09/24/2018   PVD (peripheral vascular disease) ([place]) 06/24/2018   Chronic ankle pain (Bilateral) 12/27/2017   Tendinopathy of gluteus medius (Right) 12/27/2017   Tendinopathy of gluteus medius (Left) 12/27/2017   Bilateral hip pain 12/26/2017   Chronic elbow pain (Left) 10/17/2017   Elevated troponin I level 05/21/2017   Major depressive disorder, recurrent episode, moderate ([place]) 11/19/2016   Insomnia 10/30/2016   Constipation 07/25/2016   S/P CABG x 4 07/06/2016   Bradycardia    CAD (coronary artery disease)    Carotid stenosis    CAD in native artery 06/29/2016   Elevated troponin 06/28/2016   Essential hypertension, malignant 06/28/2016   Mild tobacco abuse in early remission 06/28/2016   Essential hypertension    Malignant hypertension    Type 2 diabetes mellitus with other specified complication ([place])    Chest pain with high risk for cardiac etiology 06/27/2016   NSTEMI (non-ST elevated myocardial infarction) ([place]) 06/27/2016   Proteinuria 03/15/2016   Renal artery stenosis ([place]) 03/15/2016   MDD (major depressive disorder) 10/17/2015   Agoraphobia with panic attacks 04/25/2015   HTN (hypertension), malignant 10/20/2013   Cluster headache 03/20/2012   HLD (hyperlipidemia) 07/26/2010   ADJUSTMENT DISORDER WITH MIXED FEATURES 07/26/2010    Harrel Carina, MS, OTR/L 10/19/2021, 5:11 PM  [place] MAIN [facility] SERVICES [address] [place], [place], [address] Phone: [phone]   Fax:  [phone]  Name: Erika [name] MRN: [id number] Date of Birth: [birth year]

## 2021-10-19 NOTE — Therapy (Signed)
[facility] [address] [place], [place], [address] Phone: [phone]   Fax:  [phone]  Physical Therapy Treatment  Patient Details  Name: Erika [name] MRN: [id number] Date of Birth: 01-17-1971 Referring Provider (PT): Leeroy Cha MD   Encounter Date: 10/19/2021   PT End of Session - 10/19/21 1612     Visit Number 14    Number of Visits 20    Date for PT Re-Evaluation 10/26/21    Authorization Type [employer] max visits 20x per year speech, OT, PT    Authorization Time Period 08/03/21-10/26/21    Progress Note Due on Visit 20    PT Start Time 1515    PT Stop Time 1600    PT Time Calculation (min) 45 min    Equipment Utilized During Treatment Gait belt;Other (comment)   Left AFO   Activity Tolerance Patient tolerated treatment well;No increased pain;Patient limited by fatigue;Patient limited by pain    Behavior During Therapy [facility] for tasks assessed/performed;Impulsive             Past Medical History:  Diagnosis Date   Arthralgia of temporomandibular joint    CAD, multiple vessel    a. 06/2016 Cath: ostLM 40%, ostLAD 40%, pLAD 95%, ost-pLCx 60%, pLCx 95%, mLCx 60%, mRCA 95%, D2 50%, LVSF nl;  b. 07/2016 CABG x 4 (LIMA->LAD, VG->Diag, VG->OM, VG->RCA); c. 08/2016 Cath: 3VD w/ 4/4 patent grafts. LAD distal to LIMA has diff dzs->Med rx; d. 08/2020 Cath: 4/4 patent grafts, native 3VD. EF 55-65%-->Med Rx.   Carotid arterial disease ([place])    a. 07/2016 s/p R CEA; b. 02/2021 U/S: RICA 79-02%, LICA 4-09%.   Clotting disorder ([place])    Depression    Diastolic dysfunction    a. 06/2016 Echo: EF 50-55%, mild inf wall HK, GR1DD, mild MR, RV sys fxn nl, mildly dilated LA, PASP nl   Fatty liver disease, nonalcoholic 7353   History of blood transfusion    with heart surgery   HLD (hyperlipidemia)    Labile hypertension    a. prior renal ngiogram negative for RAS in 03/2016; b. catecholamines and metanephrines normal, mildly  elevated renin with normal aldosterone and normal ratio in 02/2016   Myocardial infarction [facility]) 2017   Obesity    PAD (peripheral artery disease) ([place])    a. 09/2018 s/p L SFA stenting; b. 07/2019 Periph Angio: Patent m/d L SFA stent w/ 100% L SFA distal to stent. L AT 100d, L Peroneal diff dzs-->Med Rx; c. 02/2021 ABIs: stable @ 0.61 on R and 0.46 on L.   PTSD (post-traumatic stress disorder)    Tobacco abuse    Type 2 diabetes mellitus ([place]) 12/2015    Past Surgical History:  Procedure Laterality Date   ABDOMINAL AORTOGRAM W/LOWER EXTREMITY [name] 10/15/2018   Procedure: ABDOMINAL AORTOGRAM W/LOWER EXTREMITY;  Surgeon: Wellington Hampshire, MD;  Location: [place] CV LAB;  Service: Cardiovascular;  Laterality: [name];   ABDOMINAL AORTOGRAM W/LOWER EXTREMITY Bilateral 08/19/2019   Procedure: ABDOMINAL AORTOGRAM W/LOWER EXTREMITY;  Surgeon: Wellington Hampshire, MD;  Location: [place] CV LAB;  Service: Cardiovascular;  Laterality: Bilateral;   CARDIAC CATHETERIZATION [name] 06/29/2016   Procedure: Left Heart Cath and Coronary Angiography;  Surgeon: Minna Merritts, MD;  Location: [place] CV LAB;  Service: Cardiovascular;  Laterality: [name];   CARDIAC CATHETERIZATION [name] 08/29/2016   Procedure: Left Heart Cath and Cors/Grafts Angiography;  Surgeon: Wellington Hampshire, MD;  Location: [place] INVASIVE CV  LAB;  Service: Cardiovascular;  Laterality: [name];   CESAREAN SECTION     CHOLECYSTECTOMY     CORONARY ARTERY BYPASS GRAFT [name] 07/06/2016   Procedure: CORONARY ARTERY BYPASS GRAFTING (CABG) x four, using left internal mammary artery and right leg greater saphenous vein harvested endoscopically;  Surgeon: Ivin Poot, MD;  Location: [place];  Service: Open Heart Surgery;  Laterality: [name];   ENDARTERECTOMY Right 07/06/2016   Procedure: ENDARTERECTOMY CAROTID;  Surgeon: Rosetta Posner, MD;  Location: [facility] OR;  Service: Vascular;  Laterality: Right;   ENDARTERECTOMY Right 04/27/2020   Procedure: REDO OF RIGHT  ENDARTERECTOMY CAROTID;  Surgeon: Rosetta Posner, MD;  Location: [facility] OR;  Service: Vascular;  Laterality: Right;   LEFT HEART CATH AND CORS/GRAFTS ANGIOGRAPHY [name] 08/24/2020   Procedure: LEFT HEART CATH AND CORS/GRAFTS ANGIOGRAPHY;  Surgeon: Wellington Hampshire, MD;  Location: [place] CV LAB;  Service: Cardiovascular;  Laterality: [name];   PERIPHERAL VASCULAR CATHETERIZATION [name] 04/18/2016   Procedure: Renal Angiography;  Surgeon: Wellington Hampshire, MD;  Location: [place] CV LAB;  Service: Cardiovascular;  Laterality: [name];   PERIPHERAL VASCULAR INTERVENTION Left 10/15/2018   Procedure: PERIPHERAL VASCULAR INTERVENTION;  Surgeon: Wellington Hampshire, MD;  Location: [place] CV LAB;  Service: Cardiovascular;  Laterality: Left;  Left superficial femoral   TEE WITHOUT CARDIOVERSION [name] 07/06/2016   Procedure: TRANSESOPHAGEAL ECHOCARDIOGRAM (TEE);  Surgeon: Ivin Poot, MD;  Location: [place];  Service: Open Heart Surgery;  Laterality: [name];   TONSILLECTOMY      There were no vitals filed for this visit.   Subjective Assessment - 10/19/21 1611     Subjective Patient and husband report patient wants to be able to use her own restroom not her daughters. Husband has deferred his surgery to care for patient.    Pertinent History Pt presented to [facility]21 c c/c of slurred speech, L facial droop & LUE weakness. MRI revealed acute R pontine perforator infarct with chronic R parietal & occipital infarcts. PMH: HTN, HLD, DM, tobacco abuse, PAD, CAD, CABG, dCHF, peptic foot drop, renal artery stenosis, MI and CAD (s/p of R CEA 2017), PTSD, adjustment disorder, obesity, Left hip OA s/p OPPT 2020. Pt DC 06/28/21 to inpatient rehab in [place], [place] to home 08/01/21. AT CIR pt worked extensively on STS with MinA or Supervision and HW, Stand and squat pivot transfers with MinA, AMB progressed to ~75ft max, mostly with HW (some RW use with fixed LUE on handle), but was mostly limited in further progression of distance 2/2  chronic PAD ischaemic pain or Left knee buckling despite AFO use. CIR recommending update to more supporting AFO at transfer to OPPT. Since home, pt is using WC to navigate the environment, family assists with ADL performance, WC still needs to be modified to fit through BR doorframe. Pt reports not feeling safe with HW at present. Husband has been helping with gnelte stretching in mornings as pt sleeps in a flexion synergy posture on LLE.    Limitations Walking;Standing;House hold activities    How long can you sit comfortably? Generally well tolerated with occasional buttocks soreness    How long can you stand comfortably? At eval: tolerates <2 minutes prior to ischemic leg pain    How long can you walk comfortably? Longest distance at CIR last month ~21ft.    Patient Stated Goals Return indpendent ambulation    Currently in Pain? No/denies  Treatment:  Patient ambulates with hemiwalker, close contact-guard assistance, and wheelchair follow for 150 feet with 1 seated rest break.  Patient requires occasional min A for weight shift onto affected left lower extremity.  Negotiating through 5 cones placed apart about 1.5 to 3 feet for carryover for bathroom mobility at home.  Patient able to weave through cones without knocking cones over requires occasional cueing for left foot to reduce episodes of instability additional cueing required for placement of hemiwalker for safety.     In parallel bars: -Lateral stepping 2 times length of parallel bars single extremity support close contact-guard assist -2 inch step toe taps patient initially challenged with left lower extremity clearance improves with repetition is challenged with weight shift/acceptance on left lower extremity for right foot taps. -Backward stepping 10 times each lower extremity single UE support      Seated -Red Thera-Band hamstring curl 10 times each lower extremity  Pt educated throughout session about  proper posture and technique with exercises. Improved exercise technique, movement at target joints, use of target muscles after min to mod verbal, visual, tactile cues.  Note: Portions of this document were prepared using Dragon voice recognition software and although reviewed may contain unintentional dictation errors in syntax, grammar, or spelling.  Patient tolerated increased ambulation duration the session with 1 seated rest break required.  Additional education on negotiation of narrow environments including sidestepping and weaving performed well with no episodes of knocking over cones.  She does however require cueing for left foot placement as well as hemiwalker safety.  Patient demonstrates excellent motivation throughout session.Patient will continue to benefit from skilled physical therapy intervention in order to improve her balance, improve her ambulatory capacity, improve her safety with transfers, and improve her overall quality of life                   PT Education - 10/19/21 1612     Education Details exercise technique, weaving between cones, lateral stepping    Person(s) Educated Patient    Methods Explanation;Demonstration;Tactile cues;Verbal cues    Comprehension Verbalized understanding;Returned demonstration;Verbal cues required;Tactile cues required              PT Short Term Goals - 09/18/21 1623       PT SHORT TERM GOAL #1   Title Pt to demonstrate improved tolerance to standing >3 minutes without physical assist or LOB. DME ad lib    Baseline <2 minutes at eval    Time 4    Period Weeks    Status Achieved    Target Date 08/31/21      PT SHORT TERM GOAL #2   Title Pt to demonstrate perfromance of 5xSTS c AFO/Hemi walker at supervision level independence without LOB or physical assist.    Baseline Needs minGuard to MinA to perform at eval, CGA level, not safe at supervision level at this point, 44.55 sec    Time 4    Period Weeks     Status On-going    Target Date 08/31/21      PT SHORT TERM GOAL #3   Title Pt to demonstrate AMB ~76ft at supervision level with AFO, HW without LOB.    Baseline Not met, CGA required for safety    Time 6    Period Weeks    Status On-going    Target Date 09/14/21      PT SHORT TERM GOAL #4   Title Pt to demonstrate TUG test <120sec c HW  and AFO at minguard - MinA level to improve independence with transfers and reduce falls risk at home.    Baseline No tug performed at eval, 100.59 sec with CGA    Time 7    Period Weeks    Status Achieved    Target Date 09/21/21               PT Long Term Goals - 09/18/21 1634       PT LONG TERM GOAL #1   Title Pt to demonstrate perfromance of 5xSTS c AFO/Hemi walker at supervision level independence without LOB or physical assist in less than 20sec.    Baseline Unable at eval, requires minguard to minA, PN 9/26: 44.5 sec with CGA for sfety    Time 8    Period Weeks    Status On-going    Target Date 11/14/21      PT LONG TERM GOAL #2   Title Pt to demonstrate FOTO score improvement >10 points to demonstrate improved perception of ability in ADL    Baseline 23 at eval:53 at PN, revised to increase by additional 10    Time 8    Period Weeks    Status Revised    Target Date 11/14/21      PT LONG TERM GOAL #3   Title Pt able to demonstrate stand pivot transfer with HW and AFO without LOB at supervision level to modI.    Baseline SBA for safety    Time 8    Period Weeks    Status On-going    Target Date 11/14/21      PT LONG TERM GOAL #4   Title Pt to demonstrate TUG test <60sec c HW and AFO at supervision level to improve independence with transfers and reduce falls risk at home.    Baseline Tug in 100.59 sec    Time 8    Period Weeks    Status New    Target Date 11/14/21                   Plan - 10/19/21 1618     Clinical Impression Statement Patient tolerated increased ambulation duration the session with 1  seated rest break required.  Additional education on negotiation of narrow environments including sidestepping and weaving performed well with no episodes of knocking over cones.  She does however require cueing for left foot placement as well as hemiwalker safety.  Patient demonstrates excellent motivation throughout session.Patient will continue to benefit from skilled physical therapy intervention in order to improve her balance, improve her ambulatory capacity, improve her safety with transfers, and improve her overall quality of life    Personal Factors and Comorbidities Age;Comorbidity 3+;Fitness;Past/Current Experience;Time since onset of injury/illness/exacerbation    Comorbidities DM, HTN, PVD, clotting disorder, CAD    Examination-Activity Limitations Squat;Stairs;Bathing;Bed Mobility;Dressing;Reach Overhead;Hygiene/Grooming;Bend;Carry;Locomotion Level;Transfers;Toileting;Stand    Examination-Participation Restrictions Cleaning;Community Activity;Occupation;Laundry;Driving;Yard Work    [employer] Evolving/Moderate complexity    Rehab Potential Good    PT Frequency 2x / week    PT Duration 12 weeks    PT Treatment/Interventions Electrical Stimulation;Gait training;Stair training;Functional mobility training;Therapeutic activities;Therapeutic exercise;Balance training;Neuromuscular re-education;Patient/family education;Manual techniques;Wheelchair mobility training;Energy conservation;Passive range of motion;Orthotic Fit/Training;DME Instruction;ADLs/Self Care Home Management    PT Next Visit Plan work toward supervision level STS and SPT c HW, sustained standing tolerance.    PT Home Exercise Plan Not updated this visit- Encouraged seated LE strengthening and practicing gait with husband.;  No updates    Consulted  and Agree with Plan of Care Patient;Family member/caregiver    Family Member Consulted husband             Patient will benefit from skilled  therapeutic intervention in order to improve the following deficits and impairments:  Decreased endurance, Decreased mobility, Difficulty walking, Hypomobility, Decreased balance, Abnormal gait, Increased muscle spasms, Cardiopulmonary status limiting activity, Impaired flexibility, Hypermobility, Decreased strength, Decreased activity tolerance  Visit Diagnosis: Abnormality of gait and mobility  Muscle weakness (generalized)  Unsteadiness on feet     Problem List Patient Active Problem List   Diagnosis Date Noted   Cerebrovascular accident (CVA) due to occlusion of vertebral artery ([place]) 09/11/2021   Cognitive change 09/11/2021   Cognitive communication deficit 08/10/2021   Urinary incontinence 08/10/2021   GERD (gastroesophageal reflux disease) 08/03/2021   Chronic post-traumatic stress disorder (PTSD)    Depression with anxiety    Right pontine stroke ([place]) 06/28/2021   Hemiplegia and hemiparesis following cerebral infarction affecting left non-dominant side ([place]) 06/24/2021   Chronic diastolic CHF (congestive heart failure) ([place]) 06/24/2021   Fall 06/24/2021   Abnormal LFTs 06/24/2021   Olecranon bursitis of left elbow 06/12/2021   Chronic hip pain (Bilateral) 06/09/2021   Chronic use of opiate for therapeutic purpose 05/09/2021   Greater trochanteric bursitis of hip (Left) 03/09/2021   Bursitis of hip (Left) 00/51/1021   Uncomplicated opioid dependence ([place]) 02/20/2021   Acute conjunctivitis of left eye 01/02/2021   Unintentional weight loss 11/21/2020   Broken teeth (Right) 08/02/2020   History of MI (myocardial infarction) (July 2017) 08/02/2020   Neuropathy 06/09/2020   Dental abscess 06/09/2020   Foot drop (Left) 06/09/2020   Carotid stenosis, asymptomatic, right 04/27/2020   Chronic migraine 11/03/2019   Thrombocytosis 09/16/2019   Erythrocytosis 09/16/2019   Hypercalcemia 09/06/2019   Leukocytosis 09/06/2019   Polycythemia 09/06/2019   Chronic hip pain (Left)  08/27/2019   Osteoarthritis of hip (Left) 08/27/2019   Gluteal tendonitis of buttock (Left) 08/27/2019   Radial nerve palsy (Right) 07/15/2019   Neuropathy of radial nerve (Right) 06/30/2019   Abnormal bruising 06/25/2019   History of carotid endarterectomy (Right) 03/04/2019   Pain medication agreement signed 03/04/2019   Atypical facial pain (Right) 01/27/2019   Chronic ear pain (Right) 01/27/2019   Chronic jaw pain (Right) 01/27/2019   Geniculate Neuralgia (Right) 01/27/2019   Vitamin D deficiency 01/19/2019   Neurogenic pain 01/19/2019   Chronic anticoagulation (PLAVIX) 01/19/2019   Chronic pain syndrome 01/07/2019   Long term current use of opiate analgesic 01/07/2019   Long term prescription benzodiazepine use 01/07/2019   Pharmacologic therapy 01/07/2019   Disorder of skeletal system 01/07/2019   Problems influencing health status 01/07/2019   Chronic headaches (1ry area of Pain) (Right) 01/07/2019   Opiate use 09/24/2018   PVD (peripheral vascular disease) ([place]) 06/24/2018   Chronic ankle pain (Bilateral) 12/27/2017   Tendinopathy of gluteus medius (Right) 12/27/2017   Tendinopathy of gluteus medius (Left) 12/27/2017   Bilateral hip pain 12/26/2017   Chronic elbow pain (Left) 10/17/2017   Elevated troponin I level 05/21/2017   Major depressive disorder, recurrent episode, moderate (HCC) 11/19/2016   Insomnia 10/30/2016   Constipation 07/25/2016   S/P CABG x 4 07/06/2016   Bradycardia    CAD (coronary artery disease)    Carotid stenosis    CAD in native artery 06/29/2016   Elevated troponin 06/28/2016   Essential hypertension, malignant 06/28/2016   Mild tobacco abuse in early remission 06/28/2016   Essential hypertension  Malignant hypertension    Type 2 diabetes mellitus with other specified complication (HCC)    Chest pain with high risk for cardiac etiology 06/27/2016   NSTEMI (non-ST elevated myocardial infarction) ([place]) 06/27/2016   Proteinuria 03/15/2016    Renal artery stenosis ([place]) 03/15/2016   MDD (major depressive disorder) 10/17/2015   Agoraphobia with panic attacks 04/25/2015   HTN (hypertension), malignant 10/20/2013   Cluster headache 03/20/2012   HLD (hyperlipidemia) 07/26/2010   ADJUSTMENT DISORDER WITH MIXED FEATURES 07/26/2010   Janna Arch, PT, DPT  10/19/2021, 4:19 PM  [place] [facility] SERVICES [address] [place], [place], [address] Phone: [phone]   Fax:  [phone]  Name: ODELIA GRACIANO MRN: [id number] Date of Birth: 06/29/1971

## 2021-10-19 NOTE — Therapy (Signed)
[place] MAIN [facility] SERVICES [address] [place], [place], [address] Phone: [phone]   Fax:  [phone]  Speech Language Pathology Treatment  Patient Details  Name: Erika [name] MRN: [id number] Date of Birth: 1971/09/10 Referring Provider (SLP): Ranell Patrick Clide Deutscher, MD   Encounter Date: 10/19/2021   End of Session - 10/19/21 1541     Visit Number 8    Number of Visits 20    Date for SLP Re-Evaluation 11/02/21    Authorization Type [employer] - 20 visits per calendar year for OT, PT, and ST combined. counts as 1 visit if same day    Authorization - Visit Number 8    Authorization - Number of Visits 20    Progress Note Due on Visit 10    SLP Start Time 1300    SLP Stop Time  1400    SLP Time Calculation (min) 60 min    Activity Tolerance Patient tolerated treatment well             Past Medical History:  Diagnosis Date   Arthralgia of temporomandibular joint    CAD, multiple vessel    a. 06/2016 Cath: ostLM 40%, ostLAD 40%, pLAD 95%, ost-pLCx 60%, pLCx 95%, mLCx 60%, mRCA 95%, D2 50%, LVSF nl;  b. 07/2016 CABG x 4 (LIMA->LAD, VG->Diag, VG->OM, VG->RCA); c. 08/2016 Cath: 3VD w/ 4/4 patent grafts. LAD distal to LIMA has diff dzs->Med rx; d. 08/2020 Cath: 4/4 patent grafts, native 3VD. EF 55-65%-->Med Rx.   Carotid arterial disease ([place])    a. 07/2016 s/p R CEA; b. 02/2021 U/S: RICA 44-96%, LICA 7-59%.   Clotting disorder ([place])    Depression    Diastolic dysfunction    a. 06/2016 Echo: EF 50-55%, mild inf wall HK, GR1DD, mild MR, RV sys fxn nl, mildly dilated LA, PASP nl   Fatty liver disease, nonalcoholic 1638   History of blood transfusion    with heart surgery   HLD (hyperlipidemia)    Labile hypertension    a. prior renal ngiogram negative for RAS in 03/2016; b. catecholamines and metanephrines normal, mildly elevated renin with normal aldosterone and normal ratio in 02/2016   Myocardial infarction [facility]) 2017   Obesity    PAD  (peripheral artery disease) ([place])    a. 09/2018 s/p L SFA stenting; b. 07/2019 Periph Angio: Patent m/d L SFA stent w/ 100% L SFA distal to stent. L AT 100d, L Peroneal diff dzs-->Med Rx; c. 02/2021 ABIs: stable @ 0.61 on R and 0.46 on L.   PTSD (post-traumatic stress disorder)    Tobacco abuse    Type 2 diabetes mellitus ([place]) 12/2015    Past Surgical History:  Procedure Laterality Date   ABDOMINAL AORTOGRAM W/LOWER EXTREMITY [name] 10/15/2018   Procedure: ABDOMINAL AORTOGRAM W/LOWER EXTREMITY;  Surgeon: Wellington Hampshire, MD;  Location: [place] CV LAB;  Service: Cardiovascular;  Laterality: [name];   ABDOMINAL AORTOGRAM W/LOWER EXTREMITY Bilateral 08/19/2019   Procedure: ABDOMINAL AORTOGRAM W/LOWER EXTREMITY;  Surgeon: Wellington Hampshire, MD;  Location: [place] CV LAB;  Service: Cardiovascular;  Laterality: Bilateral;   CARDIAC CATHETERIZATION [name] 06/29/2016   Procedure: Left Heart Cath and Coronary Angiography;  Surgeon: Minna Merritts, MD;  Location: [place] CV LAB;  Service: Cardiovascular;  Laterality: [name];   CARDIAC CATHETERIZATION [name] 08/29/2016   Procedure: Left Heart Cath and Cors/Grafts Angiography;  Surgeon: Wellington Hampshire, MD;  Location: [place] CV LAB;  Service: Cardiovascular;  Laterality: [name];  CESAREAN SECTION     CHOLECYSTECTOMY     CORONARY ARTERY BYPASS GRAFT [name] 07/06/2016   Procedure: CORONARY ARTERY BYPASS GRAFTING (CABG) x four, using left internal mammary artery and right leg greater saphenous vein harvested endoscopically;  Surgeon: Ivin Poot, MD;  Location: [place];  Service: Open Heart Surgery;  Laterality: [name];   ENDARTERECTOMY Right 07/06/2016   Procedure: ENDARTERECTOMY CAROTID;  Surgeon: Rosetta Posner, MD;  Location: [facility] OR;  Service: Vascular;  Laterality: Right;   ENDARTERECTOMY Right 04/27/2020   Procedure: REDO OF RIGHT ENDARTERECTOMY CAROTID;  Surgeon: Rosetta Posner, MD;  Location: [facility] OR;  Service: Vascular;  Laterality: Right;   LEFT HEART CATH  AND CORS/GRAFTS ANGIOGRAPHY [name] 08/24/2020   Procedure: LEFT HEART CATH AND CORS/GRAFTS ANGIOGRAPHY;  Surgeon: Wellington Hampshire, MD;  Location: [place] CV LAB;  Service: Cardiovascular;  Laterality: [name];   PERIPHERAL VASCULAR CATHETERIZATION [name] 04/18/2016   Procedure: Renal Angiography;  Surgeon: Wellington Hampshire, MD;  Location: [place] CV LAB;  Service: Cardiovascular;  Laterality: [name];   PERIPHERAL VASCULAR INTERVENTION Left 10/15/2018   Procedure: PERIPHERAL VASCULAR INTERVENTION;  Surgeon: Wellington Hampshire, MD;  Location: [place] CV LAB;  Service: Cardiovascular;  Laterality: Left;  Left superficial femoral   TEE WITHOUT CARDIOVERSION [name] 07/06/2016   Procedure: TRANSESOPHAGEAL ECHOCARDIOGRAM (TEE);  Surgeon: Ivin Poot, MD;  Location: [place];  Service: Open Heart Surgery;  Laterality: [name];   TONSILLECTOMY      There were no vitals filed for this visit.   Subjective Assessment - 10/19/21 1531     Subjective Pt's first ST visit since 09/13/21.    Patient is accompained by: Family member   spouse   Currently in Pain? No/denies                   ADULT SLP TREATMENT - 10/19/21 1533       General Information   Behavior/Cognition Alert;Cooperative    HPI Erika [name] is a 50 y.o. female admitted [facility]21 with slurred speech, left facial droop and LUE weakness; MRI showed acute R pontine CVA and chronic R parietal and occipital infarcts. Past medical history is noted for HTN, HLD, DM II, PAD, MI, CAD, s/p CABG, CHF, PTSD, adjustment disorder, obesity. Patient worked with [facility] in [place] 06/28/21-08/01/21. SLP targeted cognitive deficits and dysarthria, as well as dysphagia; upgraded to regular/thin by time of d/c home.      Cognitive-Linquistic Treatment   Treatment focused on Cognition;Patient/family/caregiver education    Skilled Treatment Goals/progress discussion given length of time since last ST visit. Discussed consideration of HH therapies as pt has  had difficulty attending appointments due to spouse's work schedule. They wish to continue with OP services due to visit limits with HH. Goals remain appropriate and in line with pt/spouse goals. Spouse reports pt impulsivity improving and pt has not been falling. She is verbalizing steps during transfers 100% of the time. Forgetting words and short-term memory are her greatest frustrations, per pt. She has not been keeping up with her calendar; boxes are too small. Provided different version with hourly log; pt filled in with occasional mod cues. Encouraged pt/ spouse to review in morning and evening, and have pt record information she needs to remember, such as when spouse is leaving/returning from work, appointments, etc. Pt wrote steps to cooking simple meal in her Instant Pot, with reminder to check for seal.      Assessment / Recommendations /  Plan   Plan Continue with current plan of care      Progression Toward Goals   Progression toward goals Progressing toward goals   slow progress toward some goals due to infrequent attendance             SLP Education - 10/19/21 1540     Education Details need regular attendance to make progress    Person(s) Educated Patient;Spouse    Methods Explanation    Comprehension Verbalized understanding              SLP Short Term Goals - 08/04/21 1246       SLP SHORT TERM GOAL #1   Title Pt will verbalize awareness of impulsivity in simple tasks >80% of the time with min cues.    Time 10    Period --   sessions   Status New      SLP SHORT TERM GOAL #2   Title Pt will ID out-of-turn responses >80% with non-verbal cues.    Time 10    Period --   sessions   Status New      SLP SHORT TERM GOAL #3   Title Pt will verbalize steps to safety for transfers/gait training to reduce impulsive/unsafe movement.    Time 10    Period --   sessions   Status New      SLP SHORT TERM GOAL #4   Title Pt will verbalize daily schedule, recent events  and salient information using external memory aids.    Time 10    Period --   sessions   Status New      SLP SHORT TERM GOAL #5   Title Generate written or verbal cues to improve accuracy of 3 house hold tasks or safety.    Time 10    Period --   sessions   Status New              SLP Long Term Goals - 08/04/21 1334       SLP LONG TERM GOAL #1   Title Pt will verbalize or write steps prior to task initiation >80% with modified independence.    Time 12    Period Weeks    Status New    Target Date 11/01/21      SLP LONG TERM GOAL #2   Title Pt will demonstrate appropriate turn-taking >90% of the time in 15 minutes mod complex conversation.    Time 12    Period Weeks    Status New    Target Date 11/01/21      SLP LONG TERM GOAL #3   Title Pt will use external aids to organize/recall appointments and medical information for herself and her daughter.    Time 12    Period Weeks    Status New    Target Date 11/01/21      SLP LONG TERM GOAL #4   Title Pt will plan, organize and present a 20-30 minute lesson on personal or gun safety using external aids/compensations    Time 12    Period Weeks    Status New    Target Date 11/01/21      SLP LONG TERM GOAL #5   Title Patient will use compensations for dysarthria in 20 minute mod complex conversation outside of [place] room for >95% intelligibility.    Time 12    Period Weeks    Status New    Target Date 11/01/21  Plan - 10/19/21 1541     Clinical Impression Statement Ladean Steinmeyer presents with overall mild-moderate cognitive communication deficits and mild dysarthria. Remains impulsive but responsive to feedback and made attemtps to slow rate to improve her accuracy. Impulsivity improving, per spouse, however pt not carrying over use of calendar/ external aids for memory. Infrequent attendance impacting progress. Encouraged more consistent attendance to maximize functional gains. Recommend skilled ST to  maximize cognitive and communication abilities to increase safety, independence, and life participation.    Speech Therapy Frequency 2x / week    Duration 12 weeks   20 visits allowed per calendar year per ins   Treatment/Interventions Language facilitation;Environmental controls;Cueing hierarchy;SLP instruction and feedback;Compensatory techniques;Cognitive reorganization;Functional tasks;Compensatory strategies;Internal/external aids;Multimodal communcation approach;Patient/family education    Potential to Achieve Goals Good    SLP Home Exercise Plan notebook/binder for therapies    Consulted and Agree with Plan of Care Patient;Family member/caregiver             Patient will benefit from skilled therapeutic intervention in order to improve the following deficits and impairments:   Cognitive communication deficit  Dysarthria and anarthria    Problem List Patient Active Problem List   Diagnosis Date Noted   Cerebrovascular accident (CVA) due to occlusion of vertebral artery ([place]) 09/11/2021   Cognitive change 09/11/2021   Cognitive communication deficit 08/10/2021   Urinary incontinence 08/10/2021   GERD (gastroesophageal reflux disease) 08/03/2021   Chronic post-traumatic stress disorder (PTSD)    Depression with anxiety    Right pontine stroke ([place]) 06/28/2021   Hemiplegia and hemiparesis following cerebral infarction affecting left non-dominant side ([place]) 06/24/2021   Chronic diastolic CHF (congestive heart failure) ([place]) 06/24/2021   Fall 06/24/2021   Abnormal LFTs 06/24/2021   Olecranon bursitis of left elbow 06/12/2021   Chronic hip pain (Bilateral) 06/09/2021   Chronic use of opiate for therapeutic purpose 05/09/2021   Greater trochanteric bursitis of hip (Left) 03/09/2021   Bursitis of hip (Left) 67/20/9470   Uncomplicated opioid dependence ([place]) 02/20/2021   Acute conjunctivitis of left eye 01/02/2021   Unintentional weight loss 11/21/2020   Broken teeth (Right)  08/02/2020   History of MI (myocardial infarction) (July 2017) 08/02/2020   Neuropathy 06/09/2020   Dental abscess 06/09/2020   Foot drop (Left) 06/09/2020   Carotid stenosis, asymptomatic, right 04/27/2020   Chronic migraine 11/03/2019   Thrombocytosis 09/16/2019   Erythrocytosis 09/16/2019   Hypercalcemia 09/06/2019   Leukocytosis 09/06/2019   Polycythemia 09/06/2019   Chronic hip pain (Left) 08/27/2019   Osteoarthritis of hip (Left) 08/27/2019   Gluteal tendonitis of buttock (Left) 08/27/2019   Radial nerve palsy (Right) 07/15/2019   Neuropathy of radial nerve (Right) 06/30/2019   Abnormal bruising 06/25/2019   History of carotid endarterectomy (Right) 03/04/2019   Pain medication agreement signed 03/04/2019   Atypical facial pain (Right) 01/27/2019   Chronic ear pain (Right) 01/27/2019   Chronic jaw pain (Right) 01/27/2019   Geniculate Neuralgia (Right) 01/27/2019   Vitamin D deficiency 01/19/2019   Neurogenic pain 01/19/2019   Chronic anticoagulation (PLAVIX) 01/19/2019   Chronic pain syndrome 01/07/2019   Long term current use of opiate analgesic 01/07/2019   Long term prescription benzodiazepine use 01/07/2019   Pharmacologic therapy 01/07/2019   Disorder of skeletal system 01/07/2019   Problems influencing health status 01/07/2019   Chronic headaches (1ry area of Pain) (Right) 01/07/2019   Opiate use 09/24/2018   PVD (peripheral vascular disease) ([place]) 06/24/2018   Chronic ankle pain (Bilateral) 12/27/2017  Tendinopathy of gluteus medius (Right) 12/27/2017   Tendinopathy of gluteus medius (Left) 12/27/2017   Bilateral hip pain 12/26/2017   Chronic elbow pain (Left) 10/17/2017   Elevated troponin I level 05/21/2017   Major depressive disorder, recurrent episode, moderate ([place]) 11/19/2016   Insomnia 10/30/2016   Constipation 07/25/2016   S/P CABG x 4 07/06/2016   Bradycardia    CAD (coronary artery disease)    Carotid stenosis    CAD in native artery 06/29/2016    Elevated troponin 06/28/2016   Essential hypertension, malignant 06/28/2016   Mild tobacco abuse in early remission 06/28/2016   Essential hypertension    Malignant hypertension    Type 2 diabetes mellitus with other specified complication (HCC)    Chest pain with high risk for cardiac etiology 06/27/2016   NSTEMI (non-ST elevated myocardial infarction) ([place]) 06/27/2016   Proteinuria 03/15/2016   Renal artery stenosis ([place]) 03/15/2016   MDD (major depressive disorder) 10/17/2015   Agoraphobia with panic attacks 04/25/2015   HTN (hypertension), malignant 10/20/2013   Cluster headache 03/20/2012   HLD (hyperlipidemia) 07/26/2010   ADJUSTMENT DISORDER WITH MIXED FEATURES 07/26/2010   Deneise Lever, [place], [place] Speech-Language Pathologist  Aliene Altes 10/19/2021, 3:43 PM  [place] MAIN [facility] SERVICES [address] [place], [place], [address] Phone: [phone]   Fax:  [phone]   Name: JASPER RUMINSKI MRN: [id number] Date of Birth: November 27, 1971

## 2021-10-23 ENCOUNTER — Ambulatory Visit: Payer: Managed Care, Other (non HMO)

## 2021-10-23 ENCOUNTER — Encounter: Payer: Self-pay | Admitting: Physical Therapy

## 2021-10-23 ENCOUNTER — Other Ambulatory Visit: Payer: Self-pay

## 2021-10-23 ENCOUNTER — Ambulatory Visit: Payer: Managed Care, Other (non HMO) | Admitting: Physical Therapy

## 2021-10-23 DIAGNOSIS — R2681 Unsteadiness on feet: Secondary | ICD-10-CM

## 2021-10-23 DIAGNOSIS — R278 Other lack of coordination: Secondary | ICD-10-CM

## 2021-10-23 DIAGNOSIS — R269 Unspecified abnormalities of gait and mobility: Secondary | ICD-10-CM

## 2021-10-23 DIAGNOSIS — R262 Difficulty in walking, not elsewhere classified: Secondary | ICD-10-CM

## 2021-10-23 DIAGNOSIS — M6281 Muscle weakness (generalized): Secondary | ICD-10-CM

## 2021-10-23 DIAGNOSIS — R2689 Other abnormalities of gait and mobility: Secondary | ICD-10-CM

## 2021-10-23 DIAGNOSIS — R482 Apraxia: Secondary | ICD-10-CM

## 2021-10-23 NOTE — Therapy (Signed)
[facility] SERVICES [address] [place], [place], [address] Phone: [phone]   Fax:  [phone]  Occupational Therapy Treatment  Patient Details  Name: Erika [name] MRN: [id number] Date of Birth: December 17, 1971 Referring Provider (OT): Dr. Leeroy Cha   Encounter Date: 10/23/2021   OT End of Session - 10/23/21 1646     Visit Number 15    Number of Visits 20    Date for OT Re-Evaluation 10/25/21    Authorization Type limited to 20 visits combined/multidiscipline    Authorization Time Period Reporting period starting 08/03/2021    OT Start Time 1517    OT Stop Time 1601    OT Time Calculation (min) 44 min    Equipment Utilized During Treatment wc    Activity Tolerance Patient tolerated treatment well    Behavior During Therapy [facility] for tasks assessed/performed             Past Medical History:  Diagnosis Date   Arthralgia of temporomandibular joint    CAD, multiple vessel    a. 06/2016 Cath: ostLM 40%, ostLAD 40%, pLAD 95%, ost-pLCx 60%, pLCx 95%, mLCx 60%, mRCA 95%, D2 50%, LVSF nl;  b. 07/2016 CABG x 4 (LIMA->LAD, VG->Diag, VG->OM, VG->RCA); c. 08/2016 Cath: 3VD w/ 4/4 patent grafts. LAD distal to LIMA has diff dzs->Med rx; d. 08/2020 Cath: 4/4 patent grafts, native 3VD. EF 55-65%-->Med Rx.   Carotid arterial disease ([place])    a. 07/2016 s/p R CEA; b. 02/2021 U/S: RICA 65-03%, LICA 5-46%.   Clotting disorder ([place])    Depression    Diastolic dysfunction    a. 06/2016 Echo: EF 50-55%, mild inf wall HK, GR1DD, mild MR, RV sys fxn nl, mildly dilated LA, PASP nl   Fatty liver disease, nonalcoholic 5681   History of blood transfusion    with heart surgery   HLD (hyperlipidemia)    Labile hypertension    a. prior renal ngiogram negative for RAS in 03/2016; b. catecholamines and metanephrines normal, mildly elevated renin with normal aldosterone and normal ratio in 02/2016   Myocardial infarction [facility]) 2017   Obesity     PAD (peripheral artery disease) ([place])    a. 09/2018 s/p L SFA stenting; b. 07/2019 Periph Angio: Patent m/d L SFA stent w/ 100% L SFA distal to stent. L AT 100d, L Peroneal diff dzs-->Med Rx; c. 02/2021 ABIs: stable @ 0.61 on R and 0.46 on L.   PTSD (post-traumatic stress disorder)    Tobacco abuse    Type 2 diabetes mellitus ([place]) 12/2015    Past Surgical History:  Procedure Laterality Date   ABDOMINAL AORTOGRAM W/LOWER EXTREMITY [name] 10/15/2018   Procedure: ABDOMINAL AORTOGRAM W/LOWER EXTREMITY;  Surgeon: Wellington Hampshire, MD;  Location: [place] [facility];  Service: Cardiovascular;  Laterality: [name];   ABDOMINAL AORTOGRAM W/LOWER EXTREMITY Bilateral 08/19/2019   Procedure: ABDOMINAL AORTOGRAM W/LOWER EXTREMITY;  Surgeon: Wellington Hampshire, MD;  Location: [place] [facility];  Service: Cardiovascular;  Laterality: Bilateral;   CARDIAC CATHETERIZATION [name] 06/29/2016   Procedure: Left Heart Cath and Coronary Angiography;  Surgeon: Minna Merritts, MD;  Location: [place] [facility];  Service: Cardiovascular;  Laterality: [name];   CARDIAC CATHETERIZATION [name] 08/29/2016   Procedure: Left Heart Cath and Cors/Grafts Angiography;  Surgeon: Wellington Hampshire, MD;  Location: [place] [facility];  Service: Cardiovascular;  Laterality: [name];   CESAREAN SECTION     CHOLECYSTECTOMY     CORONARY ARTERY BYPASS  GRAFT [name] 07/06/2016   Procedure: CORONARY ARTERY BYPASS GRAFTING (CABG) x four, using left internal mammary artery and right leg greater saphenous vein harvested endoscopically;  Surgeon: Ivin Poot, MD;  Location: [place];  Service: Open Heart Surgery;  Laterality: [name];   ENDARTERECTOMY Right 07/06/2016   Procedure: ENDARTERECTOMY CAROTID;  Surgeon: Rosetta Posner, MD;  Location: [facility] OR;  Service: Vascular;  Laterality: Right;   ENDARTERECTOMY Right 04/27/2020   Procedure: REDO OF RIGHT ENDARTERECTOMY CAROTID;  Surgeon: Rosetta Posner, MD;  Location: [facility] OR;  Service: Vascular;  Laterality: Right;   LEFT HEART  CATH AND CORS/GRAFTS ANGIOGRAPHY [name] 08/24/2020   Procedure: LEFT HEART CATH AND CORS/GRAFTS ANGIOGRAPHY;  Surgeon: Wellington Hampshire, MD;  Location: [place] [facility];  Service: Cardiovascular;  Laterality: [name];   PERIPHERAL VASCULAR CATHETERIZATION [name] 04/18/2016   Procedure: Renal Angiography;  Surgeon: Wellington Hampshire, MD;  Location: [place] [facility];  Service: Cardiovascular;  Laterality: [name];   PERIPHERAL VASCULAR INTERVENTION Left 10/15/2018   Procedure: PERIPHERAL VASCULAR INTERVENTION;  Surgeon: Wellington Hampshire, MD;  Location: [place] [facility];  Service: Cardiovascular;  Laterality: Left;  Left superficial femoral   TEE WITHOUT CARDIOVERSION [name] 07/06/2016   Procedure: TRANSESOPHAGEAL ECHOCARDIOGRAM (TEE);  Surgeon: Ivin Poot, MD;  Location: [place];  Service: Open Heart Surgery;  Laterality: [name];   TONSILLECTOMY      There were no vitals filed for this visit.   Subjective Assessment - 10/23/21 1644     Subjective  Pt reports wanting her hand swelling to go down so that she can wear her rings again on her L hand.    Patient is accompanied by: Family member    Pertinent History HX of CAD, CABG, DM2, s/p R pontine stroke    Limitations L sided hemiplegia, impulsivity, judgment, safety awareness    Patient Stated Goals "I want to be able to take care of myself."    Currently in Pain? Yes    Pain Score 4     Pain Location Wrist    Pain Orientation Left    Pain Descriptors / Indicators Aching    Pain Type Chronic pain    Pain Onset More than a month ago    Pain Frequency Intermittent    Aggravating Factors  unable to sleep on L side    Pain Relieving Factors pain meds, positioning    Pain Score 4    Pain Location Shoulder    Pain Orientation Left    Pain Descriptors / Indicators Aching;Tightness    Pain Type Chronic pain    Pain Frequency Intermittent            Occupational Therapy Treatment: Neuro re-ed: Assisted pt with bilat forward flexion with L hand in  WB position on large therapy ball, R hand over top.  OT assisted with L scapular gliding and supporting LUE into slow, controlled forward flexion/ext.  Assisted pt to WB into L hand on yoga block while seated edge of mat, assisting with gentle forward and lateral leaning into hand.  Hand squeezes x10 with intermittent passive stretching to L wrist and digit extension to inhibit flexor tone in hand.  Therapeutic Exercise: Performed active assisted scapular gliding all planes, L shoulder active assisted shoulder flexion/abd, horiz abd/add, elbow flex/ext x10 each.  Performed L passive wrist and digit flexion/ext and edema massage to L hand for edema reduction.  Reinforced elevating hand and performing self edema massage and ROM at home to  reduce edema in L hand.  Pt verbalized understanding.  Performed slow, prolonged L shoulder ER stretch with good tolerance, working to maximize shoulder mobility and minimize pain during UB ADLs.  L passive ER ~30-40 degrees.   Response to Treatment: See Plan/clinical impression below.   OT Education - 10/23/21 1646     Education Details edema management for L hand    Person(s) Educated Patient;Spouse    Methods Explanation;Verbal cues    Comprehension Verbalized understanding;Need further instruction;Verbal cues required              OT Short Term Goals - 09/18/21 1542       OT SHORT TERM GOAL #1   Title Pt will be perform HEP for LUE with min vc.    Baseline 10th visit: Pt. requires assist to perform Self-ROM. Eval: Initiated at eval, further instruction needed    Time 6    Period Weeks    Status On-going               OT Long Term Goals - 09/18/21 1536       OT LONG TERM GOAL #2   Title Pt will perform UB ADLs with set up    Baseline 10th vist: independent doffing a shirt, MinA donning    Time 12    Period Weeks    Status On-going    Target Date 10/25/21      OT LONG TERM GOAL #3   Title Pt will perform LB ADLs with min A     Baseline 10th visit: Pt. is able to donn pants with minA hiking the back waistband. Pt. requires maxA donning shoes, ans socks. Eval: Max A to perform LB ADLs    Time 12    Period Weeks    Status On-going    Target Date 10/25/21      OT LONG TERM GOAL #4   Title Pt will perform ADL transfers with distant supv    Baseline 10th visit: CGA. Eval: min A-mod A to perform ADL transfers    Time 12    Period Weeks    Status On-going    Target Date 10/25/21      OT LONG TERM GOAL #5   Title Pt will increase LUE strength by 2 MM grades to work towards use of LUE as a stabilizer for self care.    Baseline 10th visit: Pt. is starting to engage her left hand as a stabilizer.: L shoulder 1/5, elbow/wrist/hand 0/5.  Unable to use LUE as a stabilzer for self care.    Time 12    Period Weeks    Status On-going    Target Date 10/25/21                   Plan - 10/23/21 1701     Clinical Impression Statement Mild edema in L hand today.  Pt reports that she would like to be able to wear her rings again, but unable to at present d/t edema.  Pt arrived with good positioning using L arm tray and foam incline to elevate hand, and pt verbalizes understanding of edema reduction strategies noted above.  L shoulder pain up to 4/10 this day, but improved with scapular mobility and gentle AAROM through shoulder.  Good tolerance to WB to L hand with minimal L wrist pain.  Pt will continue to benefit from skilled OT for pain management throughout LUE, edema reduction, and neuro re-ed and therapeutic exercise to maximize functional  use of LUE for self care.    OT Occupational Profile and History Detailed Assessment- Review of Records and additional review of physical, cognitive, psychosocial history related to current functional performance    Occupational performance deficits (Please refer to evaluation for details): ADL's;IADL's;Leisure;Work    Marketing executive / Function / Physical Skills  ADL;Continence;Dexterity;Strength;Vision;Balance;Coordination;FMC;IADL;Body mechanics;Endurance;Gait;UE functional use;Decreased knowledge of use of DME;GMC;Mobility    Rehab Potential Good    Clinical Decision Making Several treatment options, min-mod task modification necessary    Comorbidities Affecting Occupational Performance: May have comorbidities impacting occupational performance    Modification or Assistance to Complete Evaluation  Min-Moderate modification of tasks or assist with assess necessary to complete eval    OT Frequency 2x / week    OT Duration 12 weeks    OT Treatment/Interventions Self-care/ADL training;Therapeutic exercise;DME and/or AE instruction;Functional Mobility Training;Balance training;Electrical Stimulation;Neuromuscular education;Manual Therapy;Splinting;Visual/perceptual remediation/compensation;Moist Heat;Passive range of motion;Therapeutic activities;Patient/family education    Consulted and Agree with Plan of Care Patient;Family member/caregiver    Family Member Consulted spouse, [name]             Patient will benefit from skilled therapeutic intervention in order to improve the following deficits and impairments:   Body Structure / Function / Physical Skills: ADL, Continence, Dexterity, Strength, Vision, Balance, Coordination, FMC, IADL, Body mechanics, Endurance, Gait, UE functional use, Decreased knowledge of use of DME, [place], Mobility       Visit Diagnosis: Apraxia  Muscle weakness (generalized)  Other lack of coordination    Problem List Patient Active Problem List   Diagnosis Date Noted   Cerebrovascular accident (CVA) due to occlusion of vertebral artery ([place]) 09/11/2021   Cognitive change 09/11/2021   Cognitive communication deficit 08/10/2021   Urinary incontinence 08/10/2021   GERD (gastroesophageal reflux disease) 08/03/2021   Chronic post-traumatic stress disorder (PTSD)    Depression with anxiety    Right pontine stroke ([place])  06/28/2021   Hemiplegia and hemiparesis following cerebral infarction affecting left non-dominant side ([place]) 06/24/2021   Chronic diastolic CHF (congestive heart failure) ([place]) 06/24/2021   Fall 06/24/2021   Abnormal LFTs 06/24/2021   Olecranon bursitis of left elbow 06/12/2021   Chronic hip pain (Bilateral) 06/09/2021   Chronic use of opiate for therapeutic purpose 05/09/2021   Greater trochanteric bursitis of hip (Left) 03/09/2021   Bursitis of hip (Left) 91/47/8295   Uncomplicated opioid dependence ([place]) 02/20/2021   Acute conjunctivitis of left eye 01/02/2021   Unintentional weight loss 11/21/2020   Broken teeth (Right) 08/02/2020   History of MI (myocardial infarction) (July 2017) 08/02/2020   Neuropathy 06/09/2020   Dental abscess 06/09/2020   Foot drop (Left) 06/09/2020   Carotid stenosis, asymptomatic, right 04/27/2020   Chronic migraine 11/03/2019   Thrombocytosis 09/16/2019   Erythrocytosis 09/16/2019   Hypercalcemia 09/06/2019   Leukocytosis 09/06/2019   Polycythemia 09/06/2019   Chronic hip pain (Left) 08/27/2019   Osteoarthritis of hip (Left) 08/27/2019   Gluteal tendonitis of buttock (Left) 08/27/2019   Radial nerve palsy (Right) 07/15/2019   Neuropathy of radial nerve (Right) 06/30/2019   Abnormal bruising 06/25/2019   History of carotid endarterectomy (Right) 03/04/2019   Pain medication agreement signed 03/04/2019   Atypical facial pain (Right) 01/27/2019   Chronic ear pain (Right) 01/27/2019   Chronic jaw pain (Right) 01/27/2019   Geniculate Neuralgia (Right) 01/27/2019   Vitamin D deficiency 01/19/2019   Neurogenic pain 01/19/2019   Chronic anticoagulation (PLAVIX) 01/19/2019   Chronic pain syndrome 01/07/2019   Long term current use  of opiate analgesic 01/07/2019   Long term prescription benzodiazepine use 01/07/2019   Pharmacologic therapy 01/07/2019   Disorder of skeletal system 01/07/2019   Problems influencing health status 01/07/2019   Chronic  headaches (1ry area of Pain) (Right) 01/07/2019   Opiate use 09/24/2018   PVD (peripheral vascular disease) ([place]) 06/24/2018   Chronic ankle pain (Bilateral) 12/27/2017   Tendinopathy of gluteus medius (Right) 12/27/2017   Tendinopathy of gluteus medius (Left) 12/27/2017   Bilateral hip pain 12/26/2017   Chronic elbow pain (Left) 10/17/2017   Elevated troponin I level 05/21/2017   Major depressive disorder, recurrent episode, moderate ([place]) 11/19/2016   Insomnia 10/30/2016   Constipation 07/25/2016   S/P CABG x 4 07/06/2016   Bradycardia    CAD (coronary artery disease)    Carotid stenosis    CAD in native artery 06/29/2016   Elevated troponin 06/28/2016   Essential hypertension, malignant 06/28/2016   Mild tobacco abuse in early remission 06/28/2016   Essential hypertension    Malignant hypertension    Type 2 diabetes mellitus with other specified complication ([place])    Chest pain with high risk for cardiac etiology 06/27/2016   NSTEMI (non-ST elevated myocardial infarction) ([place]) 06/27/2016   Proteinuria 03/15/2016   Renal artery stenosis ([place]) 03/15/2016   MDD (major depressive disorder) 10/17/2015   Agoraphobia with panic attacks 04/25/2015   HTN (hypertension), malignant 10/20/2013   Cluster headache 03/20/2012   HLD (hyperlipidemia) 07/26/2010   ADJUSTMENT DISORDER WITH MIXED FEATURES 07/26/2010   Leta Speller, MS, OTR/L  Darleene Cleaver, OT/L 10/23/2021, 5:01 PM  [place] [facility] SERVICES [place], [place], [address] Phone: [phone]   Fax:  [phone]  Name: Erika [name] MRN: [id number] Date of Birth: 01/04/1971

## 2021-10-23 NOTE — Therapy (Signed)
[place] MAIN [facility] SERVICES [address] [place], [place], [address] Phone: [phone]   Fax:  [phone]  Physical Therapy Treatment  Patient Details  Name: Erika [name] MRN: [id number] Date of Birth: June 05, 1971 Referring Provider (PT): Leeroy Cha MD   Encounter Date: 10/23/2021   PT End of Session - 10/23/21 1711     Visit Number 15    Number of Visits 20    Date for PT Re-Evaluation 10/26/21    Authorization Type [employer] max visits 20x per year speech, OT, PT    Authorization Time Period 08/03/21-10/26/21    Progress Note Due on Visit 20    PT Start Time 1603    PT Stop Time 1645    PT Time Calculation (min) 42 min    Equipment Utilized During Treatment Gait belt;Other (comment)   Left AFO   Activity Tolerance Patient tolerated treatment well;Patient limited by fatigue;Patient limited by pain    Behavior During Therapy [facility] for tasks assessed/performed;Impulsive             Past Medical History:  Diagnosis Date   Arthralgia of temporomandibular joint    CAD, multiple vessel    a. 06/2016 Cath: ostLM 40%, ostLAD 40%, pLAD 95%, ost-pLCx 60%, pLCx 95%, mLCx 60%, mRCA 95%, D2 50%, LVSF nl;  b. 07/2016 CABG x 4 (LIMA->LAD, VG->Diag, VG->OM, VG->RCA); c. 08/2016 Cath: 3VD w/ 4/4 patent grafts. LAD distal to LIMA has diff dzs->Med rx; d. 08/2020 Cath: 4/4 patent grafts, native 3VD. EF 55-65%-->Med Rx.   Carotid arterial disease ([place])    a. 07/2016 s/p R CEA; b. 02/2021 U/S: RICA 10-62%, LICA 6-94%.   Clotting disorder ([place])    Depression    Diastolic dysfunction    a. 06/2016 Echo: EF 50-55%, mild inf wall HK, GR1DD, mild MR, RV sys fxn nl, mildly dilated LA, PASP nl   Fatty liver disease, nonalcoholic 8546   History of blood transfusion    with heart surgery   HLD (hyperlipidemia)    Labile hypertension    a. prior renal ngiogram negative for RAS in 03/2016; b. catecholamines and metanephrines normal, mildly elevated renin  with normal aldosterone and normal ratio in 02/2016   Myocardial infarction [facility]) 2017   Obesity    PAD (peripheral artery disease) ([place])    a. 09/2018 s/p L SFA stenting; b. 07/2019 Periph Angio: Patent m/d L SFA stent w/ 100% L SFA distal to stent. L AT 100d, L Peroneal diff dzs-->Med Rx; c. 02/2021 ABIs: stable @ 0.61 on R and 0.46 on L.   PTSD (post-traumatic stress disorder)    Tobacco abuse    Type 2 diabetes mellitus ([place]) 12/2015    Past Surgical History:  Procedure Laterality Date   ABDOMINAL AORTOGRAM W/LOWER EXTREMITY [name] 10/15/2018   Procedure: ABDOMINAL AORTOGRAM W/LOWER EXTREMITY;  Surgeon: Wellington Hampshire, MD;  Location: [place] CV LAB;  Service: Cardiovascular;  Laterality: [name];   ABDOMINAL AORTOGRAM W/LOWER EXTREMITY Bilateral 08/19/2019   Procedure: ABDOMINAL AORTOGRAM W/LOWER EXTREMITY;  Surgeon: Wellington Hampshire, MD;  Location: [place] CV LAB;  Service: Cardiovascular;  Laterality: Bilateral;   CARDIAC CATHETERIZATION [name] 06/29/2016   Procedure: Left Heart Cath and Coronary Angiography;  Surgeon: Minna Merritts, MD;  Location: [place] CV LAB;  Service: Cardiovascular;  Laterality: [name];   CARDIAC CATHETERIZATION [name] 08/29/2016   Procedure: Left Heart Cath and Cors/Grafts Angiography;  Surgeon: Wellington Hampshire, MD;  Location: [place] CV LAB;  Service: Cardiovascular;  Laterality: [name];   CESAREAN SECTION     CHOLECYSTECTOMY     CORONARY ARTERY BYPASS GRAFT [name] 07/06/2016   Procedure: CORONARY ARTERY BYPASS GRAFTING (CABG) x four, using left internal mammary artery and right leg greater saphenous vein harvested endoscopically;  Surgeon: Ivin Poot, MD;  Location: [place];  Service: Open Heart Surgery;  Laterality: [name];   ENDARTERECTOMY Right 07/06/2016   Procedure: ENDARTERECTOMY CAROTID;  Surgeon: Rosetta Posner, MD;  Location: [facility] OR;  Service: Vascular;  Laterality: Right;   ENDARTERECTOMY Right 04/27/2020   Procedure: REDO OF RIGHT ENDARTERECTOMY CAROTID;   Surgeon: Rosetta Posner, MD;  Location: [facility] OR;  Service: Vascular;  Laterality: Right;   LEFT HEART CATH AND CORS/GRAFTS ANGIOGRAPHY [name] 08/24/2020   Procedure: LEFT HEART CATH AND CORS/GRAFTS ANGIOGRAPHY;  Surgeon: Wellington Hampshire, MD;  Location: [place] CV LAB;  Service: Cardiovascular;  Laterality: [name];   PERIPHERAL VASCULAR CATHETERIZATION [name] 04/18/2016   Procedure: Renal Angiography;  Surgeon: Wellington Hampshire, MD;  Location: [place] CV LAB;  Service: Cardiovascular;  Laterality: [name];   PERIPHERAL VASCULAR INTERVENTION Left 10/15/2018   Procedure: PERIPHERAL VASCULAR INTERVENTION;  Surgeon: Wellington Hampshire, MD;  Location: [place] CV LAB;  Service: Cardiovascular;  Laterality: Left;  Left superficial femoral   TEE WITHOUT CARDIOVERSION [name] 07/06/2016   Procedure: TRANSESOPHAGEAL ECHOCARDIOGRAM (TEE);  Surgeon: Ivin Poot, MD;  Location: [place];  Service: Open Heart Surgery;  Laterality: [name];   TONSILLECTOMY      There were no vitals filed for this visit.   Subjective Assessment - 10/23/21 1604     Subjective Patient denies recent changes in medication. No falls or LOB. Pt will recieve a Botox injection next week to help with headaches. Husband reports BLE have remained "weak" since last session.    Pertinent History Pt presented to [facility]/24/21 c c/c of slurred speech, L facial droop & LUE weakness. MRI revealed acute R pontine perforator infarct with chronic R parietal & occipital infarcts. PMH: HTN, HLD, DM, tobacco abuse, PAD, CAD, CABG, dCHF, peptic foot drop, renal artery stenosis, MI and CAD (s/p of R CEA 2017), PTSD, adjustment disorder, obesity, Left hip OA s/p OPPT 2020. Pt DC 06/28/21 to inpatient rehab in [place] [place] to home 08/01/21. AT CIR pt worked extensively on STS with MinA or Supervision and HW, Stand and squat pivot transfers with MinA, AMB progressed to ~96f max, mostly with HW (some RW use with fixed LUE on handle), but was mostly limited in further  progression of distance 2/2 chronic PAD ischaemic pain or Left knee buckling despite AFO use. CIR recommending update to more supporting AFO at transfer to OPPT. Since home, pt is using WC to navigate the environment, family assists with ADL performance, WC still needs to be modified to fit through BR doorframe. Pt reports not feeling safe with HW at present. Husband has been helping with gnelte stretching in mornings as pt sleeps in a flexion synergy posture on LLE.    Limitations Walking;Standing;House hold activities    How long can you sit comfortably? Generally well tolerated with occasional buttocks soreness    How long can you stand comfortably? At eval: tolerates <2 minutes prior to ischemic leg pain    How long can you walk comfortably? Longest distance at CIR last month ~252f    Patient Stated Goals Return to independent ambulation    Currently in Pain? Yes    Pain Score 8  Pain Location Head    Pain Descriptors / Indicators Aching;Headache    Pain Type Chronic pain    Pain Onset More than a month ago    Multiple Pain Sites No               Treatment:  Patient ambulates with hemiwalker, close contact-guard assistance for 150 feet with 1 seated rest break.  Patient requires occasional min A for weight shift onto affected left lower extremity and for occasional steadying.  Negotiating through 5 cones placed apart about 2 to 3 feet. Difficulty with navigating around cones, primarily due to visual deficits of the L field. Moderate carryover of tactile feedback after knocking over cone, pt did require VC to assist with avoiding cone.     At support bar: - Lateral weight shifting; 2 minutes, VC for increased L WB - A-P weight shifting; 2 minutes, L knee buckle with each A weight shift        Seated -RTB hamstring curl 2x10 each lower extremity   Pt educated throughout session about proper posture and technique with exercises. Improved exercise technique, movement at  target joints, use of target muscles after min to mod verbal, visual, tactile cues.     Patient maintained increased ambulation distance and duration this session; she again required 1 seated rest break. Education provided on pacing, communication throughout session and navigating obstacles 2/2 to visual field deficits. Patient demonstrates excellent motivation throughout session, agreeable to all exercises however asking for rest breaks. Patient will continue to benefit from skilled physical therapy intervention in order to improve her balance, improve her ambulatory capacity, improve her safety with transfers, and improve her overall quality of life.            PT Short Term Goals - 09/18/21 1623       PT SHORT TERM GOAL #1   Title Pt to demonstrate improved tolerance to standing >3 minutes without physical assist or LOB. DME ad lib    Baseline <2 minutes at eval    Time 4    Period Weeks    Status Achieved    Target Date 08/31/21      PT SHORT TERM GOAL #2   Title Pt to demonstrate perfromance of 5xSTS c AFO/Hemi walker at supervision level independence without LOB or physical assist.    Baseline Needs minGuard to MinA to perform at eval, CGA level, not safe at supervision level at this point, 44.55 sec    Time 4    Period Weeks    Status On-going    Target Date 08/31/21      PT SHORT TERM GOAL #3   Title Pt to demonstrate AMB ~34f at supervision level with AFO, HW without LOB.    Baseline Not met, CGA required for safety    Time 6    Period Weeks    Status On-going    Target Date 09/14/21      PT SHORT TERM GOAL #4   Title Pt to demonstrate TUG test <120sec c HW and AFO at minguard - MinA level to improve independence with transfers and reduce falls risk at home.    Baseline No tug performed at eval, 100.59 sec with CGA    Time 7    Period Weeks    Status Achieved    Target Date 09/21/21               PT Long Term Goals - 09/18/21 1634  PT LONG  TERM GOAL #1   Title Pt to demonstrate perfromance of 5xSTS c AFO/Hemi walker at supervision level independence without LOB or physical assist in less than 20sec.    Baseline Unable at eval, requires minguard to minA, PN 9/26: 44.5 sec with CGA for sfety    Time 8    Period Weeks    Status On-going    Target Date 11/14/21      PT LONG TERM GOAL #2   Title Pt to demonstrate FOTO score improvement >10 points to demonstrate improved perception of ability in ADL    Baseline 23 at eval:53 at PN, revised to increase by additional 10    Time 8    Period Weeks    Status Revised    Target Date 11/14/21      PT LONG TERM GOAL #3   Title Pt able to demonstrate stand pivot transfer with HW and AFO without LOB at supervision level to modI.    Baseline SBA for safety    Time 8    Period Weeks    Status On-going    Target Date 11/14/21      PT LONG TERM GOAL #4   Title Pt to demonstrate TUG test <60sec c HW and AFO at supervision level to improve independence with transfers and reduce falls risk at home.    Baseline Tug in 100.59 sec    Time 8    Period Weeks    Status New    Target Date 11/14/21                   Plan - 10/23/21 1712     Clinical Impression Statement Patient maintained increased ambulation distance and duration this session; she again required 1 seated rest break. Education provided on pacing, communication throughout session and navigating obstacles 2/2 to visual field deficits. Patient demonstrates excellent motivation throughout session, agreeable to all exercises however asking for rest breaks. Patient will continue to benefit from skilled physical therapy intervention in order to improve her balance, improve her ambulatory capacity, improve her safety with transfers, and improve her overall quality of life.    Personal Factors and Comorbidities Age;Comorbidity 3+;Fitness;Past/Current Experience;Time since onset of injury/illness/exacerbation    Comorbidities DM,  HTN, PVD, clotting disorder, CAD    Examination-Activity Limitations Squat;Stairs;Bathing;Bed Mobility;Dressing;Reach Overhead;Hygiene/Grooming;Bend;Carry;Locomotion Level;Transfers;Toileting;Stand    Examination-Participation Restrictions Cleaning;Community Activity;Occupation;Laundry;Driving;Yard Work    [employer] Evolving/Moderate complexity    Rehab Potential Good    PT Frequency 2x / week    PT Duration 12 weeks    PT Treatment/Interventions Electrical Stimulation;Gait training;Stair training;Functional mobility training;Therapeutic activities;Therapeutic exercise;Balance training;Neuromuscular re-education;Patient/family education;Manual techniques;Wheelchair mobility training;Energy conservation;Passive range of motion;Orthotic Fit/Training;DME Instruction;ADLs/Self Care Home Management    PT Next Visit Plan work toward supervision level STS and SPT c HW, sustained standing tolerance.    PT Home Exercise Plan Not updated this visit- Encouraged seated LE strengthening and practicing gait with husband.;  No updates    Consulted and Agree with Plan of Care Patient;Family member/caregiver    Family Member Consulted husband             Patient will benefit from skilled therapeutic intervention in order to improve the following deficits and impairments:  Decreased endurance, Decreased mobility, Difficulty walking, Hypomobility, Decreased balance, Abnormal gait, Increased muscle spasms, Cardiopulmonary status limiting activity, Impaired flexibility, Hypermobility, Decreased strength, Decreased activity tolerance  Visit Diagnosis: Abnormality of gait and mobility  Other abnormalities of gait and mobility  Other  lack of coordination  Difficulty in walking, not elsewhere classified  Muscle weakness (generalized)  Unsteadiness on feet     Problem List Patient Active Problem List   Diagnosis Date Noted   Cerebrovascular accident (CVA) due to occlusion of  vertebral artery ([place]) 09/11/2021   Cognitive change 09/11/2021   Cognitive communication deficit 08/10/2021   Urinary incontinence 08/10/2021   GERD (gastroesophageal reflux disease) 08/03/2021   Chronic post-traumatic stress disorder (PTSD)    Depression with anxiety    Right pontine stroke ([place]) 06/28/2021   Hemiplegia and hemiparesis following cerebral infarction affecting left non-dominant side ([place]) 06/24/2021   Chronic diastolic CHF (congestive heart failure) ([place]) 06/24/2021   Fall 06/24/2021   Abnormal LFTs 06/24/2021   Olecranon bursitis of left elbow 06/12/2021   Chronic hip pain (Bilateral) 06/09/2021   Chronic use of opiate for therapeutic purpose 05/09/2021   Greater trochanteric bursitis of hip (Left) 03/09/2021   Bursitis of hip (Left) 23/55/7322   Uncomplicated opioid dependence ([place]) 02/20/2021   Acute conjunctivitis of left eye 01/02/2021   Unintentional weight loss 11/21/2020   Broken teeth (Right) 08/02/2020   History of MI (myocardial infarction) (July 2017) 08/02/2020   Neuropathy 06/09/2020   Dental abscess 06/09/2020   Foot drop (Left) 06/09/2020   Carotid stenosis, asymptomatic, right 04/27/2020   Chronic migraine 11/03/2019   Thrombocytosis 09/16/2019   Erythrocytosis 09/16/2019   Hypercalcemia 09/06/2019   Leukocytosis 09/06/2019   Polycythemia 09/06/2019   Chronic hip pain (Left) 08/27/2019   Osteoarthritis of hip (Left) 08/27/2019   Gluteal tendonitis of buttock (Left) 08/27/2019   Radial nerve palsy (Right) 07/15/2019   Neuropathy of radial nerve (Right) 06/30/2019   Abnormal bruising 06/25/2019   History of carotid endarterectomy (Right) 03/04/2019   Pain medication agreement signed 03/04/2019   Atypical facial pain (Right) 01/27/2019   Chronic ear pain (Right) 01/27/2019   Chronic jaw pain (Right) 01/27/2019   Geniculate Neuralgia (Right) 01/27/2019   Vitamin D deficiency 01/19/2019   Neurogenic pain 01/19/2019   Chronic anticoagulation  (PLAVIX) 01/19/2019   Chronic pain syndrome 01/07/2019   Long term current use of opiate analgesic 01/07/2019   Long term prescription benzodiazepine use 01/07/2019   Pharmacologic therapy 01/07/2019   Disorder of skeletal system 01/07/2019   Problems influencing health status 01/07/2019   Chronic headaches (1ry area of Pain) (Right) 01/07/2019   Opiate use 09/24/2018   PVD (peripheral vascular disease) ([place]) 06/24/2018   Chronic ankle pain (Bilateral) 12/27/2017   Tendinopathy of gluteus medius (Right) 12/27/2017   Tendinopathy of gluteus medius (Left) 12/27/2017   Bilateral hip pain 12/26/2017   Chronic elbow pain (Left) 10/17/2017   Elevated troponin I level 05/21/2017   Major depressive disorder, recurrent episode, moderate (HCC) 11/19/2016   Insomnia 10/30/2016   Constipation 07/25/2016   S/P CABG x 4 07/06/2016   Bradycardia    CAD (coronary artery disease)    Carotid stenosis    CAD in native artery 06/29/2016   Elevated troponin 06/28/2016   Essential hypertension, malignant 06/28/2016   Mild tobacco abuse in early remission 06/28/2016   Essential hypertension    Malignant hypertension    Type 2 diabetes mellitus with other specified complication (HCC)    Chest pain with high risk for cardiac etiology 06/27/2016   NSTEMI (non-ST elevated myocardial infarction) ([place]) 06/27/2016   Proteinuria 03/15/2016   Renal artery stenosis ([place]) 03/15/2016   MDD (major depressive disorder) 10/17/2015   Agoraphobia with panic attacks 04/25/2015   HTN (hypertension), malignant  10/20/2013   Cluster headache 03/20/2012   HLD (hyperlipidemia) 07/26/2010   ADJUSTMENT DISORDER WITH MIXED FEATURES 07/26/2010     Patrina Levering PT, DPT  [facility] MAIN [facility] SERVICES [address] [place], [place], [address] Phone: [phone]   Fax:  [phone]  Name: DESHAWN SKELLEY MRN: [id number] Date of Birth: 1971/01/12

## 2021-10-26 ENCOUNTER — Ambulatory Visit: Payer: Managed Care, Other (non HMO) | Attending: Physical Medicine and Rehabilitation | Admitting: Speech Pathology

## 2021-10-26 ENCOUNTER — Other Ambulatory Visit: Payer: Self-pay

## 2021-10-26 ENCOUNTER — Ambulatory Visit: Payer: Managed Care, Other (non HMO) | Admitting: Physical Therapy

## 2021-10-26 ENCOUNTER — Ambulatory Visit: Payer: Managed Care, Other (non HMO) | Admitting: Occupational Therapy

## 2021-10-26 ENCOUNTER — Encounter: Payer: Self-pay | Admitting: Physical Therapy

## 2021-10-26 DIAGNOSIS — R262 Difficulty in walking, not elsewhere classified: Secondary | ICD-10-CM

## 2021-10-26 DIAGNOSIS — R269 Unspecified abnormalities of gait and mobility: Secondary | ICD-10-CM

## 2021-10-26 DIAGNOSIS — R2689 Other abnormalities of gait and mobility: Secondary | ICD-10-CM | POA: Insufficient documentation

## 2021-10-26 DIAGNOSIS — R41841 Cognitive communication deficit: Secondary | ICD-10-CM | POA: Diagnosis present

## 2021-10-26 DIAGNOSIS — R2681 Unsteadiness on feet: Secondary | ICD-10-CM

## 2021-10-26 DIAGNOSIS — R278 Other lack of coordination: Secondary | ICD-10-CM | POA: Diagnosis present

## 2021-10-26 DIAGNOSIS — M6281 Muscle weakness (generalized): Secondary | ICD-10-CM | POA: Insufficient documentation

## 2021-10-26 DIAGNOSIS — I69354 Hemiplegia and hemiparesis following cerebral infarction affecting left non-dominant side: Secondary | ICD-10-CM | POA: Insufficient documentation

## 2021-10-26 NOTE — Addendum Note (Signed)
Addended by: Lucia Bitter on: 10/26/2021 03:54 PM   Modules accepted: Orders

## 2021-10-26 NOTE — Therapy (Signed)
[place] MAIN [facility] SERVICES [address] [place], [place], [address] Phone: [phone]   Fax:  [phone]  Physical Therapy Treatment  Patient Details  Name: Erika [name] MRN: [id number] Date of Birth: [birth year] Referring Provider (PT): Leeroy Cha MD   Encounter Date: 10/26/2021   PT End of Session - 10/26/21 1747     Visit Number 16    Number of Visits 20    Date for PT Re-Evaluation 10/26/21    Authorization Type [employer] max visits 20x per year speech, OT, PT    Authorization Time Period 08/03/21-10/26/21    Progress Note Due on Visit 20    PT Start Time 1603    PT Stop Time 1650    PT Time Calculation (min) 47 min    Equipment Utilized During Treatment Gait belt;Other (comment)   Left AFO   Activity Tolerance Patient tolerated treatment well;Patient limited by fatigue;Patient limited by pain    Behavior During Therapy [facility] for tasks assessed/performed;Impulsive             Past Medical History:  Diagnosis Date   Arthralgia of temporomandibular joint    CAD, multiple vessel    a. 06/2016 Cath: ostLM 40%, ostLAD 40%, pLAD 95%, ost-pLCx 60%, pLCx 95%, mLCx 60%, mRCA 95%, D2 50%, LVSF nl;  b. 07/2016 CABG x 4 (LIMA->LAD, VG->Diag, VG->OM, VG->RCA); c. 08/2016 Cath: 3VD w/ 4/4 patent grafts. LAD distal to LIMA has diff dzs->Med rx; d. 08/2020 Cath: 4/4 patent grafts, native 3VD. EF 55-65%-->Med Rx.   Carotid arterial disease ([place])    a. 07/2016 s/p R CEA; b. 02/2021 U/S: RICA 37-90%, LICA 2-40%.   Clotting disorder ([place])    Depression    Diastolic dysfunction    a. 06/2016 Echo: EF 50-55%, mild inf wall HK, GR1DD, mild MR, RV sys fxn nl, mildly dilated LA, PASP nl   Fatty liver disease, nonalcoholic 9735   History of blood transfusion    with heart surgery   HLD (hyperlipidemia)    Labile hypertension    a. prior renal ngiogram negative for RAS in 03/2016; b. catecholamines and metanephrines normal, mildly elevated renin  with normal aldosterone and normal ratio in 02/2016   Myocardial infarction [facility]) 2017   Obesity    PAD (peripheral artery disease) ([place])    a. 09/2018 s/p L SFA stenting; b. 07/2019 Periph Angio: Patent m/d L SFA stent w/ 100% L SFA distal to stent. L AT 100d, L Peroneal diff dzs-->Med Rx; c. 02/2021 ABIs: stable @ 0.61 on R and 0.46 on L.   PTSD (post-traumatic stress disorder)    Tobacco abuse    Type 2 diabetes mellitus ([place]) 12/2015    Past Surgical History:  Procedure Laterality Date   ABDOMINAL AORTOGRAM W/LOWER EXTREMITY [name] 10/15/2018   Procedure: ABDOMINAL AORTOGRAM W/LOWER EXTREMITY;  Surgeon: Wellington Hampshire, MD;  Location: [place] CV LAB;  Service: Cardiovascular;  Laterality: [name];   ABDOMINAL AORTOGRAM W/LOWER EXTREMITY Bilateral 08/19/2019   Procedure: ABDOMINAL AORTOGRAM W/LOWER EXTREMITY;  Surgeon: Wellington Hampshire, MD;  Location: [place] CV LAB;  Service: Cardiovascular;  Laterality: Bilateral;   CARDIAC CATHETERIZATION [name] 06/29/2016   Procedure: Left Heart Cath and Coronary Angiography;  Surgeon: Minna Merritts, MD;  Location: [place] CV LAB;  Service: Cardiovascular;  Laterality: [name];   CARDIAC CATHETERIZATION [name] 08/29/2016   Procedure: Left Heart Cath and Cors/Grafts Angiography;  Surgeon: Wellington Hampshire, MD;  Location: [place] CV LAB;  Service: Cardiovascular;  Laterality: [name];   CESAREAN SECTION     CHOLECYSTECTOMY     CORONARY ARTERY BYPASS GRAFT [name] 07/06/2016   Procedure: CORONARY ARTERY BYPASS GRAFTING (CABG) x four, using left internal mammary artery and right leg greater saphenous vein harvested endoscopically;  Surgeon: Ivin Poot, MD;  Location: [place];  Service: Open Heart Surgery;  Laterality: [name];   ENDARTERECTOMY Right 07/06/2016   Procedure: ENDARTERECTOMY CAROTID;  Surgeon: Rosetta Posner, MD;  Location: [facility] OR;  Service: Vascular;  Laterality: Right;   ENDARTERECTOMY Right 04/27/2020   Procedure: REDO OF RIGHT ENDARTERECTOMY CAROTID;   Surgeon: Rosetta Posner, MD;  Location: [facility] OR;  Service: Vascular;  Laterality: Right;   LEFT HEART CATH AND CORS/GRAFTS ANGIOGRAPHY [name] 08/24/2020   Procedure: LEFT HEART CATH AND CORS/GRAFTS ANGIOGRAPHY;  Surgeon: Wellington Hampshire, MD;  Location: [place] CV LAB;  Service: Cardiovascular;  Laterality: [name];   PERIPHERAL VASCULAR CATHETERIZATION [name] 04/18/2016   Procedure: Renal Angiography;  Surgeon: Wellington Hampshire, MD;  Location: [place] CV LAB;  Service: Cardiovascular;  Laterality: [name];   PERIPHERAL VASCULAR INTERVENTION Left 10/15/2018   Procedure: PERIPHERAL VASCULAR INTERVENTION;  Surgeon: Wellington Hampshire, MD;  Location: [place] CV LAB;  Service: Cardiovascular;  Laterality: Left;  Left superficial femoral   TEE WITHOUT CARDIOVERSION [name] 07/06/2016   Procedure: TRANSESOPHAGEAL ECHOCARDIOGRAM (TEE);  Surgeon: Ivin Poot, MD;  Location: [place];  Service: Open Heart Surgery;  Laterality: [name];   TONSILLECTOMY      There were no vitals filed for this visit.   Subjective Assessment - 10/26/21 1517     Subjective Pt reports generalized weakness has resolved since last session. She states she has been walking more at home. Denies pain, LOB/falls.    Pertinent History Pt presented to [facility] 06/24/21 c c/c of slurred speech, L facial droop & LUE weakness. MRI revealed acute R pontine perforator infarct with chronic R parietal & occipital infarcts. PMH: HTN, HLD, DM, tobacco abuse, PAD, CAD, CABG, dCHF, peptic foot drop, renal artery stenosis, MI and CAD (s/p of R CEA 2017), PTSD, adjustment disorder, obesity, Left hip OA s/p OPPT 2020. Pt DC 06/28/21 to inpatient rehab in [place], [place] to home 08/01/21. AT CIR pt worked extensively on STS with MinA or Supervision and HW, Stand and squat pivot transfers with MinA, AMB progressed to ~25f max, mostly with HW (some RW use with fixed LUE on handle), but was mostly limited in further progression of distance 2/2 chronic PAD ischaemic pain or Left  knee buckling despite AFO use. CIR recommending update to more supporting AFO at transfer to OPPT. Since home, pt is using WC to navigate the environment, family assists with ADL performance, WC still needs to be modified to fit through BR doorframe. Pt reports not feeling safe with HW at present. Husband has been helping with gnelte stretching in mornings as pt sleeps in a flexion synergy posture on LLE.    Limitations Walking;Standing;House hold activities    How long can you sit comfortably? Generally well tolerated with occasional buttocks soreness    How long can you stand comfortably? At eval: tolerates <2 minutes prior to ischemic leg pain    How long can you walk comfortably? Longest distance at CIR last month ~250f    Patient Stated Goals Return to independent ambulation    Currently in Pain? No/denies    Pain Onset More than a month ago  INTERVENTION  Patient ambulates with hemiwalker, close contact-guard assistance for 150 feet with 1 seated rest break.  Patient requires occasional min A for weight shift onto affected left lower extremity and for occasional steadying. Good visual scanning in hallway while reading sticky notes.     Pt educated throughout session about proper posture and technique with exercises. Improved exercise technique, movement at target joints, use of target muscles after min to mod verbal, visual, tactile cues.   Provided additional HEP in order to space out PT sessions through the end of the year. 1 set of each exercise in HEP completed. Husband present for HEP education. HEP included:  Access Code: [email or web address] URL: [email or web address] Date: 10/26/2021 Prepared by: Patrina Levering  Exercises Supine Bridge - 1 x daily - 7 x weekly - 2 sets - 10 reps Hooklying Clamshell with Resistance - 1 x daily - 7 x weekly - 2 sets - 10 reps *performed single LE at a time for improved activation of LLE. RTB. Supine Active Straight Leg Raise -  1 x daily - 7 x weekly - 2 sets - 10 reps. *AAROM due to L knee lag.  Supine Hamstring Stretch with Caregiver - 1 x daily - 7 x weekly - 2 sets - 10 reps Supine Lower Trunk Rotation - 1 x daily - 7 x weekly - 2 sets - 10 reps Sit to Stand with Armchair - 1 x daily - 7 x weekly - 2 sets - 10 reps *VC to bear weight through BLE. 180 Degree Pivot Turn with hemiwalker - 1 x daily - 7 x weekly - 2 sets - 10 reps Knee drive with Counter Support - 1 x daily - 7 x weekly - 2 sets - 10 reps. *to be performed on each side for improved knee drive during ambulation as well as SLS stability while performing on R.     Patient maintained ambulation distance this session requiring 1 seated rest break; today visual scanning was added to task while in hallway - pt performed this task well. Due to dwindling authorized visits by insurance, PT and pt agreed on decreasing frequency to bi-weekly through the end of the year. An updated HEP will be provided each session. PT created HEP for PT this session and provided additional ideas to pt and husband including obstacle courses, ladder drills, balance/weight shifting to encourage WB through LLE. [place] handout provided. Pt and husband verbalize and demonstrate understanding of HEP. Patient will continue to benefit from skilled physical therapy intervention in order to improve her balance, improve her ambulatory capacity, improve her safety with transfers, and improve her overall quality of life.           PT Education - 10/26/21 1746     Education Details MedBridge: Hassan Buckler    Person(s) Educated Patient;Spouse    Methods Handout;Explanation;Demonstration    Comprehension Verbalized understanding;Returned demonstration              PT Short Term Goals - 09/18/21 1623       PT SHORT TERM GOAL #1   Title Pt to demonstrate improved tolerance to standing >3 minutes without physical assist or LOB. DME ad lib    Baseline <2 minutes at eval    Time 4     Period Weeks    Status Achieved    Target Date 08/31/21      PT SHORT TERM GOAL #2   Title Pt to demonstrate perfromance of 5xSTS c AFO/Hemi walker at supervision level independence  without LOB or physical assist.    Baseline Needs minGuard to MinA to perform at eval, CGA level, not safe at supervision level at this point, 44.55 sec    Time 4    Period Weeks    Status On-going    Target Date 08/31/21      PT SHORT TERM GOAL #3   Title Pt to demonstrate AMB ~72f at supervision level with AFO, HW without LOB.    Baseline Not met, CGA required for safety    Time 6    Period Weeks    Status On-going    Target Date 09/14/21      PT SHORT TERM GOAL #4   Title Pt to demonstrate TUG test <120sec c HW and AFO at minguard - MinA level to improve independence with transfers and reduce falls risk at home.    Baseline No tug performed at eval, 100.59 sec with CGA    Time 7    Period Weeks    Status Achieved    Target Date 09/21/21               PT Long Term Goals - 09/18/21 1634       PT LONG TERM GOAL #1   Title Pt to demonstrate perfromance of 5xSTS c AFO/Hemi walker at supervision level independence without LOB or physical assist in less than 20sec.    Baseline Unable at eval, requires minguard to minA, PN 9/26: 44.5 sec with CGA for sfety    Time 8    Period Weeks    Status On-going    Target Date 11/14/21      PT LONG TERM GOAL #2   Title Pt to demonstrate FOTO score improvement >10 points to demonstrate improved perception of ability in ADL    Baseline 23 at eval:53 at PN, revised to increase by additional 10    Time 8    Period Weeks    Status Revised    Target Date 11/14/21      PT LONG TERM GOAL #3   Title Pt able to demonstrate stand pivot transfer with HW and AFO without LOB at supervision level to modI.    Baseline SBA for safety    Time 8    Period Weeks    Status On-going    Target Date 11/14/21      PT LONG TERM GOAL #4   Title Pt to demonstrate TUG  test <60sec c HW and AFO at supervision level to improve independence with transfers and reduce falls risk at home.    Baseline Tug in 100.59 sec    Time 8    Period Weeks    Status New    Target Date 11/14/21                   Plan - 10/26/21 1747     Clinical Impression Statement Patient maintained ambulation distance this session requiring 1 seated rest break; today visual scanning was added to task while in hallway - pt performed this task well. Due to dwindling authorized visits by insurance, PT and pt agreed on decreasing frequency to bi-weekly through the end of the year. An updated HEP will be provided each session. PT created HEP for PT this session and provided additional ideas to pt and husband including obstacle courses, ladder drills, balance/weight shifting to encourage WB through LLE. [place] provided. Pt and husband verbalize and demonstrate understanding of HEP. Patient will continue to benefit from skilled  physical therapy intervention in order to improve her balance, improve her ambulatory capacity, improve her safety with transfers, and improve her overall quality of life.    Personal Factors and Comorbidities Age;Comorbidity 3+;Fitness;Past/Current Experience;Time since onset of injury/illness/exacerbation    Comorbidities DM, HTN, PVD, clotting disorder, CAD    Examination-Activity Limitations Squat;Stairs;Bathing;Bed Mobility;Dressing;Reach Overhead;Hygiene/Grooming;Bend;Carry;Locomotion Level;Transfers;Toileting;Stand    Examination-Participation Restrictions Cleaning;Community Activity;Occupation;Laundry;Driving;Yard Work    [employer] Evolving/Moderate complexity    Rehab Potential Good    PT Frequency 2x / week    PT Duration 12 weeks    PT Treatment/Interventions Electrical Stimulation;Gait training;Stair training;Functional mobility training;Therapeutic activities;Therapeutic exercise;Balance training;Neuromuscular  re-education;Patient/family education;Manual techniques;Wheelchair mobility training;Energy conservation;Passive range of motion;Orthotic Fit/Training;DME Instruction;ADLs/Self Care Home Management    PT Next Visit Plan work toward supervision level STS and SPT c HW, sustained standing tolerance.    PT Home Exercise Plan Not updated this visit- Encouraged seated LE strengthening and practicing gait with husband.;  No updates    Consulted and Agree with Plan of Care Patient;Family member/caregiver    Family Member Consulted husband             Patient will benefit from skilled therapeutic intervention in order to improve the following deficits and impairments:  Decreased endurance, Decreased mobility, Difficulty walking, Hypomobility, Decreased balance, Abnormal gait, Increased muscle spasms, Cardiopulmonary status limiting activity, Impaired flexibility, Hypermobility, Decreased strength, Decreased activity tolerance  Visit Diagnosis: Abnormality of gait and mobility  Other abnormalities of gait and mobility  Difficulty in walking, not elsewhere classified  Other lack of coordination  Muscle weakness (generalized)  Unsteadiness on feet     Problem List Patient Active Problem List   Diagnosis Date Noted   Cerebrovascular accident (CVA) due to occlusion of vertebral artery ([place]) 09/11/2021   Cognitive change 09/11/2021   Cognitive communication deficit 08/10/2021   Urinary incontinence 08/10/2021   GERD (gastroesophageal reflux disease) 08/03/2021   Chronic post-traumatic stress disorder (PTSD)    Depression with anxiety    Right pontine stroke ([place]) 06/28/2021   Hemiplegia and hemiparesis following cerebral infarction affecting left non-dominant side ([place]) 06/24/2021   Chronic diastolic CHF (congestive heart failure) ([place]) 06/24/2021   Fall 06/24/2021   Abnormal LFTs 06/24/2021   Olecranon bursitis of left elbow 06/12/2021   Chronic hip pain (Bilateral) 06/09/2021    Chronic use of opiate for therapeutic purpose 05/09/2021   Greater trochanteric bursitis of hip (Left) 03/09/2021   Bursitis of hip (Left) 98/92/1194   Uncomplicated opioid dependence ([place]) 02/20/2021   Acute conjunctivitis of left eye 01/02/2021   Unintentional weight loss 11/21/2020   Broken teeth (Right) 08/02/2020   History of MI (myocardial infarction) (July 2017) 08/02/2020   Neuropathy 06/09/2020   Dental abscess 06/09/2020   Foot drop (Left) 06/09/2020   Carotid stenosis, asymptomatic, right 04/27/2020   Chronic migraine 11/03/2019   Thrombocytosis 09/16/2019   Erythrocytosis 09/16/2019   Hypercalcemia 09/06/2019   Leukocytosis 09/06/2019   Polycythemia 09/06/2019   Chronic hip pain (Left) 08/27/2019   Osteoarthritis of hip (Left) 08/27/2019   Gluteal tendonitis of buttock (Left) 08/27/2019   Radial nerve palsy (Right) 07/15/2019   Neuropathy of radial nerve (Right) 06/30/2019   Abnormal bruising 06/25/2019   History of carotid endarterectomy (Right) 03/04/2019   Pain medication agreement signed 03/04/2019   Atypical facial pain (Right) 01/27/2019   Chronic ear pain (Right) 01/27/2019   Chronic jaw pain (Right) 01/27/2019   Geniculate Neuralgia (Right) 01/27/2019   Vitamin D deficiency 01/19/2019   Neurogenic pain  01/19/2019   Chronic anticoagulation (PLAVIX) 01/19/2019   Chronic pain syndrome 01/07/2019   Long term current use of opiate analgesic 01/07/2019   Long term prescription benzodiazepine use 01/07/2019   Pharmacologic therapy 01/07/2019   Disorder of skeletal system 01/07/2019   Problems influencing health status 01/07/2019   Chronic headaches (1ry area of Pain) (Right) 01/07/2019   Opiate use 09/24/2018   PVD (peripheral vascular disease) ([place]) 06/24/2018   Chronic ankle pain (Bilateral) 12/27/2017   Tendinopathy of gluteus medius (Right) 12/27/2017   Tendinopathy of gluteus medius (Left) 12/27/2017   Bilateral hip pain 12/26/2017   Chronic elbow pain  (Left) 10/17/2017   Elevated troponin I level 05/21/2017   Major depressive disorder, recurrent episode, moderate ([place]) 11/19/2016   Insomnia 10/30/2016   Constipation 07/25/2016   S/P CABG x 4 07/06/2016   Bradycardia    CAD (coronary artery disease)    Carotid stenosis    CAD in native artery 06/29/2016   Elevated troponin 06/28/2016   Essential hypertension, malignant 06/28/2016   Mild tobacco abuse in early remission 06/28/2016   Essential hypertension    Malignant hypertension    Type 2 diabetes mellitus with other specified complication (HCC)    Chest pain with high risk for cardiac etiology 06/27/2016   NSTEMI (non-ST elevated myocardial infarction) ([place]) 06/27/2016   Proteinuria 03/15/2016   Renal artery stenosis ([place]) 03/15/2016   MDD (major depressive disorder) 10/17/2015   Agoraphobia with panic attacks 04/25/2015   HTN (hypertension), malignant 10/20/2013   Cluster headache 03/20/2012   HLD (hyperlipidemia) 07/26/2010   ADJUSTMENT DISORDER WITH MIXED FEATURES 07/26/2010    Patrina Levering PT, DPT  [facility] MAIN [facility] SERVICES [address] [place], [place], [address] Phone: [phone]   Fax:  [phone]  Name: Erika [name] MRN: [id number] Date of Birth: 12-Sep-1971

## 2021-10-26 NOTE — Patient Instructions (Addendum)
To keep your place on the page when reading: try using a paperclip to mark where you left off.   Consider keeping a reading journal, where you write down brief summary, or keep a list of characters and big plot points.   Cooking: challenge your planning skills by thinking ahead and making a written checklist for [name] for when she is going to cook something, instead of telling her the steps. Try to anticipate when a drawing might be helpful (size of dice/chop, liquid/dry measurement fractions) and include those.  Preplanning your grocery list: Check the Food [facility] app or website for best deals before you go.   Bring your planner next time!

## 2021-10-26 NOTE — Therapy (Signed)
[place] MAIN [facility] SERVICES [address] [place], [place], [address] Phone: [phone]   Fax:  [phone]  Occupational Therapy Treatment/Recertification Note  Patient Details  Name: Erika [name] MRN: [id number] Date of Birth: [birth year] Referring Provider (OT): Dr. Leeroy Cha   Encounter Date: 10/26/2021   OT End of Session - 10/26/21 1509     Visit Number 16    Number of Visits 44    Date for OT Re-Evaluation 01/18/22    OT Start Time 1433    OT Stop Time 1515    OT Time Calculation (min) 42 min    Activity Tolerance Patient tolerated treatment well    Behavior During Therapy [facility] for tasks assessed/performed;Impulsive             Past Medical History:  Diagnosis Date   Arthralgia of temporomandibular joint    CAD, multiple vessel    a. 06/2016 Cath: ostLM 40%, ostLAD 40%, pLAD 95%, ost-pLCx 60%, pLCx 95%, mLCx 60%, mRCA 95%, D2 50%, LVSF nl;  b. 07/2016 CABG x 4 (LIMA->LAD, VG->Diag, VG->OM, VG->RCA); c. 08/2016 Cath: 3VD w/ 4/4 patent grafts. LAD distal to LIMA has diff dzs->Med rx; d. 08/2020 Cath: 4/4 patent grafts, native 3VD. EF 55-65%-->Med Rx.   Carotid arterial disease ([place])    a. 07/2016 s/p R CEA; b. 02/2021 U/S: RICA 37-85%, LICA 8-85%.   Clotting disorder ([place])    Depression    Diastolic dysfunction    a. 06/2016 Echo: EF 50-55%, mild inf wall HK, GR1DD, mild MR, RV sys fxn nl, mildly dilated LA, PASP nl   Fatty liver disease, nonalcoholic 0277   History of blood transfusion    with heart surgery   HLD (hyperlipidemia)    Labile hypertension    a. prior renal ngiogram negative for RAS in 03/2016; b. catecholamines and metanephrines normal, mildly elevated renin with normal aldosterone and normal ratio in 02/2016   Myocardial infarction [facility]) 2017   Obesity    PAD (peripheral artery disease) ([place])    a. 09/2018 s/p L SFA stenting; b. 07/2019 Periph Angio: Patent m/d L SFA stent w/ 100% L SFA distal to stent.  L AT 100d, L Peroneal diff dzs-->Med Rx; c. 02/2021 ABIs: stable @ 0.61 on R and 0.46 on L.   PTSD (post-traumatic stress disorder)    Tobacco abuse    Type 2 diabetes mellitus ([place]) 12/2015    Past Surgical History:  Procedure Laterality Date   ABDOMINAL AORTOGRAM W/LOWER EXTREMITY [name] 10/15/2018   Procedure: ABDOMINAL AORTOGRAM W/LOWER EXTREMITY;  Surgeon: Wellington Hampshire, MD;  Location: [place] [facility];  Service: Cardiovascular;  Laterality: [name];   ABDOMINAL AORTOGRAM W/LOWER EXTREMITY Bilateral 08/19/2019   Procedure: ABDOMINAL AORTOGRAM W/LOWER EXTREMITY;  Surgeon: Wellington Hampshire, MD;  Location: [place] [facility];  Service: Cardiovascular;  Laterality: Bilateral;   CARDIAC CATHETERIZATION [name] 06/29/2016   Procedure: Left Heart Cath and Coronary Angiography;  Surgeon: Minna Merritts, MD;  Location: [place] [facility];  Service: Cardiovascular;  Laterality: [name];   CARDIAC CATHETERIZATION [name] 08/29/2016   Procedure: Left Heart Cath and Cors/Grafts Angiography;  Surgeon: Wellington Hampshire, MD;  Location: [place] [facility];  Service: Cardiovascular;  Laterality: [name];   CESAREAN SECTION     CHOLECYSTECTOMY     CORONARY ARTERY BYPASS GRAFT [name] 07/06/2016   Procedure: CORONARY ARTERY BYPASS GRAFTING (CABG) x four, using left internal mammary artery and right leg greater saphenous vein harvested endoscopically;  Surgeon: Ivin Poot, MD;  Location: [place];  Service: Open Heart Surgery;  Laterality: [name];   ENDARTERECTOMY Right 07/06/2016   Procedure: ENDARTERECTOMY CAROTID;  Surgeon: Rosetta Posner, MD;  Location: [facility] OR;  Service: Vascular;  Laterality: Right;   ENDARTERECTOMY Right 04/27/2020   Procedure: REDO OF RIGHT ENDARTERECTOMY CAROTID;  Surgeon: Rosetta Posner, MD;  Location: [facility] OR;  Service: Vascular;  Laterality: Right;   LEFT HEART CATH AND CORS/GRAFTS ANGIOGRAPHY [name] 08/24/2020   Procedure: LEFT HEART CATH AND CORS/GRAFTS ANGIOGRAPHY;  Surgeon: Wellington Hampshire, MD;  Location: [place] [facility];  Service: Cardiovascular;  Laterality: [name];   PERIPHERAL VASCULAR CATHETERIZATION [name] 04/18/2016   Procedure: Renal Angiography;  Surgeon: Wellington Hampshire, MD;  Location: [place] [facility];  Service: Cardiovascular;  Laterality: [name];   PERIPHERAL VASCULAR INTERVENTION Left 10/15/2018   Procedure: PERIPHERAL VASCULAR INTERVENTION;  Surgeon: Wellington Hampshire, MD;  Location: [place] [facility];  Service: Cardiovascular;  Laterality: Left;  Left superficial femoral   TEE WITHOUT CARDIOVERSION [name] 07/06/2016   Procedure: TRANSESOPHAGEAL ECHOCARDIOGRAM (TEE);  Surgeon: Ivin Poot, MD;  Location: [place];  Service: Open Heart Surgery;  Laterality: [name];   TONSILLECTOMY      There were no vitals filed for this visit.       [facility] OT Assessment - 10/26/21 1540       AROM   Overall AROM Comments LUE PAssive: shoulder flexion 112, abduction: 108, elbow 0-140, wrist extension: 58, wrist flexion 48            Measurements were obtained, and goals were reviewed with the pt. Pt. has made progress, and has improved passive ROM in shoulder flexion, abduction, elbow extension, and wrist motion. Pt. Presents with consistent initiation of active digit flexion. Pt. has improved with self-care tasks since the last recertification period including: donning a shirt, donning pants, and shoes. Pt. is attempting to engage in more kitchen tasks at home. FOTO score is 33. Pt. continues to work on improving ROM, and LUE functioning in order to be able to engage the LUE more during ADL tasks, as well as using it as a gross assist, or stabilizer during IADL kitchen tasks.                     OT Short Term Goals - 10/26/21 1526       OT SHORT TERM GOAL #1   Title Pt will be perform HEP for LUE with min vc.    Baseline 10/26/2021: Pt. requires verbal cues,, tactile assits, and cues for visual demonstration.10th visit: Pt. requires assist to perform Self-ROM. Eval: Initiated at  eval, further instruction needed    Time 6    Period Weeks    Status On-going    Target Date 12/07/21               OT Long Term Goals - 10/26/21 1530       OT LONG TERM GOAL #1   Title Pt will increase FOTO score to 40 or better to indicate increased functional performance    Baseline 10/26/2021: FOTO score: 33, TR score: 56 Eval: 23    Time 12    Period Weeks    Status New    Target Date 01/18/22      OT LONG TERM GOAL #2   Title Pt will perform UB ADLs with set up    Baseline 10/26/2021: Indepent doffing, and minA donning. 10th vist:  independent doffing a shirt, MinA donning    Time 12    Period Weeks    Status On-going    Target Date 01/18/22      OT LONG TERM GOAL #3   Title Pt will perform LB ADLs with min A    Baseline 1103/2022: independent donning pants over feet, minA standing to hike pants with the right hand, Independent doffing socks, maxA donning socks, Mod I ndepent donning slide on shoes with a shoehorn. 10th visit: Pt. is able to donn pants with minA hiking the back waistband. Pt. requires maxA donning shoes, ans socks. Eval: Max A to perform LB ADLs    Time 12    Period Weeks    Status On-going    Target Date 01/18/22      OT LONG TERM GOAL #4   Title Pt will perform ADL transfers with distant supv    Baseline 10/26/2021: CGA, 10th visit: CGA. Eval: min A-mod A to perform ADL transfers    Time 12    Period Weeks    Status On-going    Target Date 01/18/22      OT LONG TERM GOAL #5   Title Pt will increase LUE strength by 2 MM grades to work towards use of LUE as a stabilizer for self care.    Baseline 12/26/2020: limited engagement of the LUE as a gross assist.10th visit: Pt. is starting to engage her left hand as a stabilizer.: L shoulder 1/5, elbow/wrist/hand 0/5.  Unable to use LUE as a stabilzer for self care.    Time 12    Period Weeks    Status On-going    Target Date 01/18/22      OT LONG TERM GOAL #6   Title Pt. will use her left hand  as a gross stabilizer during IADL kitchen tasks.    Baseline 10/26/2021: Pt. is not using her left hand as agross stabilizer. duirng IADL kitchen tasks.    Time 12    Period Weeks    Status On-going    Target Date 01/18/22                   Plan - 10/26/21 1524     Clinical Impression Statement Measurements were obtained, and goals were reviewed with the pt. Pt. has made progress, and has improved passive ROM in shoulder flexion, abduction, elbow extension, and wrist motion. Pt. Presents with consistent initiation of active digit flexion. Pt. Has improved with self-care tasks since the last recertification period including: donning a shirt, donning pants, and shoes. Pt. is attempting to engage in more kitchen tasks at home. FOTO score is 33. Pt. Continues to work on improving ROM, and LUE functioning in order to be able to engage the LUE more during ADL tasks, as well as using it as a gross assist, or stabilizer during IADL kitchen tasks.   OT Occupational Profile and History Detailed Assessment- Review of Records and additional review of physical, cognitive, psychosocial history related to current functional performance    Occupational performance deficits (Please refer to evaluation for details): ADL's;IADL's;Leisure;Work    Marketing executive / Function / Physical Skills ADL;Continence;Dexterity;Strength;Vision;Balance;Coordination;FMC;IADL;Body mechanics;Endurance;Gait;UE functional use;Decreased knowledge of use of DME;GMC;Mobility    Rehab Potential Good    Clinical Decision Making Several treatment options, min-mod task modification necessary    Comorbidities Affecting Occupational Performance: May have comorbidities impacting occupational performance    Modification or Assistance to Complete Evaluation  Min-Moderate modification of tasks or assist with  assess necessary to complete eval    OT Frequency 2x / week    OT Duration 12 weeks    OT Treatment/Interventions Self-care/ADL  training;Therapeutic exercise;DME and/or AE instruction;Functional Mobility Training;Balance training;Electrical Stimulation;Neuromuscular education;Manual Therapy;Splinting;Visual/perceptual remediation/compensation;Moist Heat;Passive range of motion;Therapeutic activities;Patient/family education    Consulted and Agree with Plan of Care Patient;Family member/caregiver             Patient will benefit from skilled therapeutic intervention in order to improve the following deficits and impairments:   Body Structure / Function / Physical Skills: ADL, Continence, Dexterity, Strength, Vision, Balance, Coordination, [place], IADL, Body mechanics, Endurance, Gait, UE functional use, Decreased knowledge of use of DME, GMC, Mobility       Visit Diagnosis: Muscle weakness (generalized)    Problem List Patient Active Problem List   Diagnosis Date Noted   Cerebrovascular accident (CVA) due to occlusion of vertebral artery ([place]) 09/11/2021   Cognitive change 09/11/2021   Cognitive communication deficit 08/10/2021   Urinary incontinence 08/10/2021   GERD (gastroesophageal reflux disease) 08/03/2021   Chronic post-traumatic stress disorder (PTSD)    Depression with anxiety    Right pontine stroke ([place]) 06/28/2021   Hemiplegia and hemiparesis following cerebral infarction affecting left non-dominant side ([place]) 06/24/2021   Chronic diastolic CHF (congestive heart failure) ([place]) 06/24/2021   Fall 06/24/2021   Abnormal LFTs 06/24/2021   Olecranon bursitis of left elbow 06/12/2021   Chronic hip pain (Bilateral) 06/09/2021   Chronic use of opiate for therapeutic purpose 05/09/2021   Greater trochanteric bursitis of hip (Left) 03/09/2021   Bursitis of hip (Left) 68/34/1962   Uncomplicated opioid dependence ([place]) 02/20/2021   Acute conjunctivitis of left eye 01/02/2021   Unintentional weight loss 11/21/2020   Broken teeth (Right) 08/02/2020   History of MI (myocardial infarction) (July 2017)  08/02/2020   Neuropathy 06/09/2020   Dental abscess 06/09/2020   Foot drop (Left) 06/09/2020   Carotid stenosis, asymptomatic, right 04/27/2020   Chronic migraine 11/03/2019   Thrombocytosis 09/16/2019   Erythrocytosis 09/16/2019   Hypercalcemia 09/06/2019   Leukocytosis 09/06/2019   Polycythemia 09/06/2019   Chronic hip pain (Left) 08/27/2019   Osteoarthritis of hip (Left) 08/27/2019   Gluteal tendonitis of buttock (Left) 08/27/2019   Radial nerve palsy (Right) 07/15/2019   Neuropathy of radial nerve (Right) 06/30/2019   Abnormal bruising 06/25/2019   History of carotid endarterectomy (Right) 03/04/2019   Pain medication agreement signed 03/04/2019   Atypical facial pain (Right) 01/27/2019   Chronic ear pain (Right) 01/27/2019   Chronic jaw pain (Right) 01/27/2019   Geniculate Neuralgia (Right) 01/27/2019   Vitamin D deficiency 01/19/2019   Neurogenic pain 01/19/2019   Chronic anticoagulation (PLAVIX) 01/19/2019   Chronic pain syndrome 01/07/2019   Long term current use of opiate analgesic 01/07/2019   Long term prescription benzodiazepine use 01/07/2019   Pharmacologic therapy 01/07/2019   Disorder of skeletal system 01/07/2019   Problems influencing health status 01/07/2019   Chronic headaches (1ry area of Pain) (Right) 01/07/2019   Opiate use 09/24/2018   PVD (peripheral vascular disease) ([place]) 06/24/2018   Chronic ankle pain (Bilateral) 12/27/2017   Tendinopathy of gluteus medius (Right) 12/27/2017   Tendinopathy of gluteus medius (Left) 12/27/2017   Bilateral hip pain 12/26/2017   Chronic elbow pain (Left) 10/17/2017   Elevated troponin I level 05/21/2017   Major depressive disorder, recurrent episode, moderate (HCC) 11/19/2016   Insomnia 10/30/2016   Constipation 07/25/2016   S/P CABG x 4 07/06/2016   Bradycardia  CAD (coronary artery disease)    Carotid stenosis    CAD in native artery 06/29/2016   Elevated troponin 06/28/2016   Essential hypertension,  malignant 06/28/2016   Mild tobacco abuse in early remission 06/28/2016   Essential hypertension    Malignant hypertension    Type 2 diabetes mellitus with other specified complication (HCC)    Chest pain with high risk for cardiac etiology 06/27/2016   NSTEMI (non-ST elevated myocardial infarction) ([place]) 06/27/2016   Proteinuria 03/15/2016   Renal artery stenosis ([place]) 03/15/2016   MDD (major depressive disorder) 10/17/2015   Agoraphobia with panic attacks 04/25/2015   HTN (hypertension), malignant 10/20/2013   Cluster headache 03/20/2012   HLD (hyperlipidemia) 07/26/2010   ADJUSTMENT DISORDER WITH MIXED FEATURES 07/26/2010    Harrel Carina, MS, OTR/L 10/26/2021, 3:42 PM  [place] MAIN [facility] SERVICES [address] [place], [place], [address] Phone: [phone]   Fax:  [phone]  Name: Erika [name] MRN: [id number] Date of Birth: 04-14-1971

## 2021-10-26 NOTE — Therapy (Signed)
[place] MAIN [facility] SERVICES [address] [place], [place], [address] Phone: [phone]   Fax:  [phone]  Speech Language Pathology Treatment  Patient Details  Name: Erika [name] MRN: [id number] Date of Birth: Sep 16, 1971 Referring Provider (SLP): Erika Patrick Clide Deutscher, MD   Encounter Date: 10/26/2021   End of Session - 10/26/21 1632     Visit Number 9    Number of Visits 20    Date for SLP Re-Evaluation 11/02/21    Authorization Type [employer] - 20 visits per calendar year for OT, PT, and ST combined. counts as 1 visit if same day    Authorization - Visit Number 9    Authorization - Number of Visits 20    Progress Note Due on Visit 10    SLP Start Time 1610    SLP Stop Time  1700    SLP Time Calculation (min) 50 min    Activity Tolerance Patient tolerated treatment well             Past Medical History:  Diagnosis Date   Arthralgia of temporomandibular joint    CAD, multiple vessel    a. 06/2016 Cath: ostLM 40%, ostLAD 40%, pLAD 95%, ost-pLCx 60%, pLCx 95%, mLCx 60%, mRCA 95%, D2 50%, LVSF nl;  b. 07/2016 CABG x 4 (LIMA->LAD, VG->Diag, VG->OM, VG->RCA); c. 08/2016 Cath: 3VD w/ 4/4 patent grafts. LAD distal to LIMA has diff dzs->Med rx; d. 08/2020 Cath: 4/4 patent grafts, native 3VD. EF 55-65%-->Med Rx.   Carotid arterial disease ([place])    a. 07/2016 s/p R CEA; b. 02/2021 U/S: RICA 36-14%, LICA 4-31%.   Clotting disorder ([place])    Depression    Diastolic dysfunction    a. 06/2016 Echo: EF 50-55%, mild inf wall HK, GR1DD, mild MR, RV sys fxn nl, mildly dilated LA, PASP nl   Fatty liver disease, nonalcoholic 5400   History of blood transfusion    with heart surgery   HLD (hyperlipidemia)    Labile hypertension    a. prior renal ngiogram negative for RAS in 03/2016; b. catecholamines and metanephrines normal, mildly elevated renin with normal aldosterone and normal ratio in 02/2016   Myocardial infarction [name]) 2017   Obesity    PAD  (peripheral artery disease) ([place])    a. 09/2018 s/p L SFA stenting; b. 07/2019 Periph Angio: Patent m/d L SFA stent w/ 100% L SFA distal to stent. L AT 100d, L Peroneal diff dzs-->Med Rx; c. 02/2021 ABIs: stable @ 0.61 on R and 0.46 on L.   PTSD (post-traumatic stress disorder)    Tobacco abuse    Type 2 diabetes mellitus ([place]) 12/2015    Past Surgical History:  Procedure Laterality Date   ABDOMINAL AORTOGRAM W/LOWER EXTREMITY [name] 10/15/2018   Procedure: ABDOMINAL AORTOGRAM W/LOWER EXTREMITY;  Surgeon: Erika Hampshire, MD;  Location: [place] CV LAB;  Service: Cardiovascular;  Laterality: [name];   ABDOMINAL AORTOGRAM W/LOWER EXTREMITY Bilateral 08/19/2019   Procedure: ABDOMINAL AORTOGRAM W/LOWER EXTREMITY;  Surgeon: Erika Hampshire, MD;  Location: [place] CV LAB;  Service: Cardiovascular;  Laterality: Bilateral;   CARDIAC CATHETERIZATION [name] 06/29/2016   Procedure: Left Heart Cath and Coronary Angiography;  Surgeon: Erika Merritts, MD;  Location: [place] CV LAB;  Service: Cardiovascular;  Laterality: [name];   CARDIAC CATHETERIZATION [name] 08/29/2016   Procedure: Left Heart Cath and Cors/Grafts Angiography;  Surgeon: Erika Hampshire, MD;  Location: [place] CV LAB;  Service: Cardiovascular;  Laterality: [name];  CESAREAN SECTION     CHOLECYSTECTOMY     CORONARY ARTERY BYPASS GRAFT [name] 07/06/2016   Procedure: CORONARY ARTERY BYPASS GRAFTING (CABG) x four, using left internal mammary artery and right leg greater saphenous vein harvested endoscopically;  Surgeon: Erika Poot, MD;  Location: [place];  Service: Open Heart Surgery;  Laterality: [name];   ENDARTERECTOMY Right 07/06/2016   Procedure: ENDARTERECTOMY CAROTID;  Surgeon: Erika Posner, MD;  Location: [facility] OR;  Service: Vascular;  Laterality: Right;   ENDARTERECTOMY Right 04/27/2020   Procedure: REDO OF RIGHT ENDARTERECTOMY CAROTID;  Surgeon: Erika Posner, MD;  Location: [facility] OR;  Service: Vascular;  Laterality: Right;   LEFT HEART CATH  AND CORS/GRAFTS ANGIOGRAPHY [name] 08/24/2020   Procedure: LEFT HEART CATH AND CORS/GRAFTS ANGIOGRAPHY;  Surgeon: Erika Hampshire, MD;  Location: [place] CV LAB;  Service: Cardiovascular;  Laterality: [name];   PERIPHERAL VASCULAR CATHETERIZATION [name] 04/18/2016   Procedure: Renal Angiography;  Surgeon: Erika Hampshire, MD;  Location: [place] CV LAB;  Service: Cardiovascular;  Laterality: [name];   PERIPHERAL VASCULAR INTERVENTION Left 10/15/2018   Procedure: PERIPHERAL VASCULAR INTERVENTION;  Surgeon: Erika Hampshire, MD;  Location: [place] CV LAB;  Service: Cardiovascular;  Laterality: Left;  Left superficial femoral   TEE WITHOUT CARDIOVERSION [name] 07/06/2016   Procedure: TRANSESOPHAGEAL ECHOCARDIOGRAM (TEE);  Surgeon: Erika Poot, MD;  Location: [place];  Service: Open Heart Surgery;  Laterality: [name];   TONSILLECTOMY      There were no vitals filed for this visit.   Subjective Assessment - 10/26/21 1708     Subjective "I forgot I had all 3 today." (therapies)    Currently in Pain? No/denies                   ADULT SLP TREATMENT - 10/26/21 1704       General Information   Behavior/Cognition Alert;Cooperative    HPI [name] is a 50 y.o. female admitted to [facility] 06/24/21 with slurred speech, left facial droop and LUE weakness; MRI showed acute R pontine CVA and chronic R parietal and occipital infarcts. Past medical history is noted for HTN, HLD, DM II, PAD, MI, CAD, s/p CABG, CHF, PTSD, adjustment disorder, obesity. Patient worked with [facility] [place] in [place] 06/28/21-08/01/21. SLP targeted cognitive deficits and dysarthria, as well as dysphagia; upgraded to regular/thin by time of d/c home.      Cognitive-Linquistic Treatment   Treatment focused on Cognition;Patient/family/caregiver education    Skilled Treatment Pt forgot to bring planner today but reports she has been using this to write down her goals for the day. Encouraged her to bring next visit. Reports she has been  reading a novel; was able to maintain topic and give basic summary of plot/main characters without specific names. Reports she sometimes has difficulty remembering where on the page she left off; education/strategy training including notetaking and using a more specific marker. Targeted executive function skills by having pt pre-plan a meal prep checklist for her daughter to make chicken salad. Mild impulsivity when visually scanning/ price comparing items on [employer].      Assessment / Recommendations / Plan   Plan Continue with current plan of care      Progression Toward Goals   Progression toward goals Progressing toward goals   slow progress due to infrequent attendance; limited by insurance visits               SLP Short Term Goals - 08/04/21  [place] #1   Title Pt will verbalize awareness of impulsivity in simple tasks >80% of the time with min cues.    Time 10    Period --   sessions   Status New      SLP SHORT TERM GOAL #2   Title Pt will ID out-of-turn responses >80% with non-verbal cues.    Time 10    Period --   sessions   Status New      SLP SHORT TERM GOAL #3   Title Pt will verbalize steps to safety for transfers/gait training to reduce impulsive/unsafe movement.    Time 10    Period --   sessions   Status New      SLP SHORT TERM GOAL #4   Title Pt will verbalize daily schedule, recent events and salient information using external memory aids.    Time 10    Period --   sessions   Status New      SLP SHORT TERM GOAL #5   Title Generate written or verbal cues to improve accuracy of 3 house hold tasks or safety.    Time 10    Period --   sessions   Status New              SLP Long Term Goals - 08/04/21 1334       SLP LONG TERM GOAL #1   Title Pt will verbalize or write steps prior to task initiation >80% with modified independence.    Time 12    Period Weeks    Status New    Target Date 11/01/21      SLP LONG  TERM GOAL #2   Title Pt will demonstrate appropriate turn-taking >90% of the time in 15 minutes mod complex conversation.    Time 12    Period Weeks    Status New    Target Date 11/01/21      SLP LONG TERM GOAL #3   Title Pt will use external aids to organize/recall appointments and medical information for herself and her daughter.    Time 12    Period Weeks    Status New    Target Date 11/01/21      SLP LONG TERM GOAL #4   Title Pt will plan, organize and present a 20-30 minute lesson on personal or gun safety using external aids/compensations    Time 12    Period Weeks    Status New    Target Date 11/01/21      SLP LONG TERM GOAL #5   Title Patient will use compensations for dysarthria in 20 minute mod complex conversation outside of [place] room for >95% intelligibility.    Time 12    Period Weeks    Status New    Target Date 11/01/21              Plan - 10/26/21 1632     Clinical Impression Statement Kelis Plasse presents with overall mild-moderate cognitive communication deficits and mild dysarthria. Remains impulsive but responsive to feedback and made attemtps to slow rate to improve her accuracy. Pt reports using calendar/planner at home but did not bring this today. Continue to encourage consistent/frequent attendance to maximize functional gains. Recommend skilled ST to maximize cognitive and communication abilities to increase safety, independence, and life participation.    Speech Therapy Frequency 2x / week    Duration 12 weeks   20  visits allowed per calendar year per ins   Treatment/Interventions Language facilitation;Environmental controls;Cueing hierarchy;SLP instruction and feedback;Compensatory techniques;Cognitive reorganization;Functional tasks;Compensatory strategies;Internal/external aids;Multimodal communcation approach;Patient/family education    Potential to Achieve Goals Good    SLP Home Exercise Plan notebook/binder for therapies    Consulted and  Agree with Plan of Care Patient;Family member/caregiver             Patient will benefit from skilled therapeutic intervention in order to improve the following deficits and impairments:   Cognitive communication deficit    Problem List Patient Active Problem List   Diagnosis Date Noted   Cerebrovascular accident (CVA) due to occlusion of vertebral artery ([place]) 09/11/2021   Cognitive change 09/11/2021   Cognitive communication deficit 08/10/2021   Urinary incontinence 08/10/2021   GERD (gastroesophageal reflux disease) 08/03/2021   Chronic post-traumatic stress disorder (PTSD)    Depression with anxiety    Right pontine stroke ([place]) 06/28/2021   Hemiplegia and hemiparesis following cerebral infarction affecting left non-dominant side ([place]) 06/24/2021   Chronic diastolic CHF (congestive heart failure) ([place]) 06/24/2021   Fall 06/24/2021   Abnormal LFTs 06/24/2021   Olecranon bursitis of left elbow 06/12/2021   Chronic hip pain (Bilateral) 06/09/2021   Chronic use of opiate for therapeutic purpose 05/09/2021   Greater trochanteric bursitis of hip (Left) 03/09/2021   Bursitis of hip (Left) 13/24/4010   Uncomplicated opioid dependence ([place]) 02/20/2021   Acute conjunctivitis of left eye 01/02/2021   Unintentional weight loss 11/21/2020   Broken teeth (Right) 08/02/2020   History of MI (myocardial infarction) (July 2017) 08/02/2020   Neuropathy 06/09/2020   Dental abscess 06/09/2020   Foot drop (Left) 06/09/2020   Carotid stenosis, asymptomatic, right 04/27/2020   Chronic migraine 11/03/2019   Thrombocytosis 09/16/2019   Erythrocytosis 09/16/2019   Hypercalcemia 09/06/2019   Leukocytosis 09/06/2019   Polycythemia 09/06/2019   Chronic hip pain (Left) 08/27/2019   Osteoarthritis of hip (Left) 08/27/2019   Gluteal tendonitis of buttock (Left) 08/27/2019   Radial nerve palsy (Right) 07/15/2019   Neuropathy of radial nerve (Right) 06/30/2019   Abnormal bruising 06/25/2019    History of carotid endarterectomy (Right) 03/04/2019   Pain medication agreement signed 03/04/2019   Atypical facial pain (Right) 01/27/2019   Chronic ear pain (Right) 01/27/2019   Chronic jaw pain (Right) 01/27/2019   Geniculate Neuralgia (Right) 01/27/2019   Vitamin D deficiency 01/19/2019   Neurogenic pain 01/19/2019   Chronic anticoagulation (PLAVIX) 01/19/2019   Chronic pain syndrome 01/07/2019   Long term current use of opiate analgesic 01/07/2019   Long term prescription benzodiazepine use 01/07/2019   Pharmacologic therapy 01/07/2019   Disorder of skeletal system 01/07/2019   Problems influencing health status 01/07/2019   Chronic headaches (1ry area of Pain) (Right) 01/07/2019   Opiate use 09/24/2018   PVD (peripheral vascular disease) ([place]) 06/24/2018   Chronic ankle pain (Bilateral) 12/27/2017   Tendinopathy of gluteus medius (Right) 12/27/2017   Tendinopathy of gluteus medius (Left) 12/27/2017   Bilateral hip pain 12/26/2017   Chronic elbow pain (Left) 10/17/2017   Elevated troponin I level 05/21/2017   Major depressive disorder, recurrent episode, moderate (HCC) 11/19/2016   Insomnia 10/30/2016   Constipation 07/25/2016   S/P CABG x 4 07/06/2016   Bradycardia    CAD (coronary artery disease)    Carotid stenosis    CAD in native artery 06/29/2016   Elevated troponin 06/28/2016   Essential hypertension, malignant 06/28/2016   Mild tobacco abuse in early remission 06/28/2016   Essential  hypertension    Malignant hypertension    Type 2 diabetes mellitus with other specified complication (HCC)    Chest pain with high risk for cardiac etiology 06/27/2016   NSTEMI (non-ST elevated myocardial infarction) ([place]) 06/27/2016   Proteinuria 03/15/2016   Renal artery stenosis ([place]) 03/15/2016   MDD (major depressive disorder) 10/17/2015   Agoraphobia with panic attacks 04/25/2015   HTN (hypertension), malignant 10/20/2013   Cluster headache 03/20/2012   HLD  (hyperlipidemia) 07/26/2010   ADJUSTMENT DISORDER WITH MIXED FEATURES 07/26/2010   Deneise Lever, [place], [facility] Speech-Language Pathologist  Aliene Altes 10/26/2021, 5:09 PM  [facility] [address] [place], [place], [address] Phone: [phone]   Fax:  [phone]   Name: Erika [name] MRN: [id number] Date of Birth: 01-20-1971

## 2021-11-07 ENCOUNTER — Ambulatory Visit: Payer: Managed Care, Other (non HMO)

## 2021-11-07 ENCOUNTER — Ambulatory Visit: Payer: Managed Care, Other (non HMO) | Admitting: Speech Pathology

## 2021-11-08 ENCOUNTER — Ambulatory Visit: Payer: Managed Care, Other (non HMO)

## 2021-11-08 ENCOUNTER — Other Ambulatory Visit: Payer: Self-pay

## 2021-11-08 DIAGNOSIS — M6281 Muscle weakness (generalized): Secondary | ICD-10-CM

## 2021-11-08 DIAGNOSIS — R2689 Other abnormalities of gait and mobility: Secondary | ICD-10-CM

## 2021-11-08 DIAGNOSIS — R41841 Cognitive communication deficit: Secondary | ICD-10-CM | POA: Diagnosis not present

## 2021-11-08 DIAGNOSIS — I69354 Hemiplegia and hemiparesis following cerebral infarction affecting left non-dominant side: Secondary | ICD-10-CM

## 2021-11-08 DIAGNOSIS — R278 Other lack of coordination: Secondary | ICD-10-CM

## 2021-11-08 DIAGNOSIS — R2681 Unsteadiness on feet: Secondary | ICD-10-CM

## 2021-11-08 NOTE — Therapy (Signed)
[place] MAIN [facility] SERVICES [address] [place], [place], [address] Phone: [phone]   Fax:  [phone]  Physical Therapy Treatment/RECERT  Patient Details  Name: Erika [name] MRN: [id number] Date of Birth: 08-Dec-1971 Referring Provider (PT): Leeroy Cha MD   Encounter Date: 11/08/2021   PT End of Session - 11/08/21 1649     Visit Number 17    Number of Visits 41    Date for PT Re-Evaluation 01/31/22    Authorization Type [employer] max visits 20x per year speech, OT, PT    Authorization Time Period 08/03/21-10/26/21    Authorization - Number of Visits 17    Progress Note Due on Visit 20    PT Start Time 1602    PT Stop Time 1645    PT Time Calculation (min) 43 min    Equipment Utilized During Treatment Gait belt;Other (comment)   Left AFO   Activity Tolerance Patient tolerated treatment well;Patient limited by fatigue;Patient limited by pain    Behavior During Therapy [facility] for tasks assessed/performed;Impulsive             Past Medical History:  Diagnosis Date   Arthralgia of temporomandibular joint    CAD, multiple vessel    a. 06/2016 Cath: ostLM 40%, ostLAD 40%, pLAD 95%, ost-pLCx 60%, pLCx 95%, mLCx 60%, mRCA 95%, D2 50%, LVSF nl;  b. 07/2016 CABG x 4 (LIMA->LAD, VG->Diag, VG->OM, VG->RCA); c. 08/2016 Cath: 3VD w/ 4/4 patent grafts. LAD distal to LIMA has diff dzs->Med rx; d. 08/2020 Cath: 4/4 patent grafts, native 3VD. EF 55-65%-->Med Rx.   Carotid arterial disease ([employer])    a. 07/2016 s/p R CEA; b. 02/2021 U/S: RICA 41-66%, LICA 0-63%.   Clotting disorder ([place])    Depression    Diastolic dysfunction    a. 06/2016 Echo: EF 50-55%, mild inf wall HK, GR1DD, mild MR, RV sys fxn nl, mildly dilated LA, PASP nl   Fatty liver disease, nonalcoholic 0160   History of blood transfusion    with heart surgery   HLD (hyperlipidemia)    Labile hypertension    a. prior renal ngiogram negative for RAS in 03/2016; b. catecholamines and  metanephrines normal, mildly elevated renin with normal aldosterone and normal ratio in 02/2016   Myocardial infarction [facility]) 2017   Obesity    PAD (peripheral artery disease) ([employer])    a. 09/2018 s/p L SFA stenting; b. 07/2019 Periph Angio: Patent m/d L SFA stent w/ 100% L SFA distal to stent. L AT 100d, L Peroneal diff dzs-->Med Rx; c. 02/2021 ABIs: stable @ 0.61 on R and 0.46 on L.   PTSD (post-traumatic stress disorder)    Tobacco abuse    Type 2 diabetes mellitus ([place]) 12/2015    Past Surgical History:  Procedure Laterality Date   ABDOMINAL AORTOGRAM W/LOWER EXTREMITY [name] 10/15/2018   Procedure: ABDOMINAL AORTOGRAM W/LOWER EXTREMITY;  Surgeon: Wellington Hampshire, MD;  Location: [place] CV LAB;  Service: Cardiovascular;  Laterality: [name];   ABDOMINAL AORTOGRAM W/LOWER EXTREMITY Bilateral 08/19/2019   Procedure: ABDOMINAL AORTOGRAM W/LOWER EXTREMITY;  Surgeon: Wellington Hampshire, MD;  Location: [place] CV LAB;  Service: Cardiovascular;  Laterality: Bilateral;   CARDIAC CATHETERIZATION [name] 06/29/2016   Procedure: Left Heart Cath and Coronary Angiography;  Surgeon: Minna Merritts, MD;  Location: [place] CV LAB;  Service: Cardiovascular;  Laterality: [name];   CARDIAC CATHETERIZATION [name] 08/29/2016   Procedure: Left Heart Cath and Cors/Grafts Angiography;  Surgeon: Mertie Clause  Fletcher Anon, MD;  Location: [place] CV LAB;  Service: Cardiovascular;  Laterality: [name];   CESAREAN SECTION     CHOLECYSTECTOMY     CORONARY ARTERY BYPASS GRAFT [name] 07/06/2016   Procedure: CORONARY ARTERY BYPASS GRAFTING (CABG) x four, using left internal mammary artery and right leg greater saphenous vein harvested endoscopically;  Surgeon: Ivin Poot, MD;  Location: [place];  Service: Open Heart Surgery;  Laterality: [name];   ENDARTERECTOMY Right 07/06/2016   Procedure: ENDARTERECTOMY CAROTID;  Surgeon: Rosetta Posner, MD;  Location: [facility] OR;  Service: Vascular;  Laterality: Right;   ENDARTERECTOMY Right 04/27/2020    Procedure: REDO OF RIGHT ENDARTERECTOMY CAROTID;  Surgeon: Rosetta Posner, MD;  Location: [facility] OR;  Service: Vascular;  Laterality: Right;   LEFT HEART CATH AND CORS/GRAFTS ANGIOGRAPHY [name] 08/24/2020   Procedure: LEFT HEART CATH AND CORS/GRAFTS ANGIOGRAPHY;  Surgeon: Wellington Hampshire, MD;  Location: [place] CV LAB;  Service: Cardiovascular;  Laterality: [name];   PERIPHERAL VASCULAR CATHETERIZATION [name] 04/18/2016   Procedure: Renal Angiography;  Surgeon: Wellington Hampshire, MD;  Location: [place] CV LAB;  Service: Cardiovascular;  Laterality: [name];   PERIPHERAL VASCULAR INTERVENTION Left 10/15/2018   Procedure: PERIPHERAL VASCULAR INTERVENTION;  Surgeon: Wellington Hampshire, MD;  Location: [place] CV LAB;  Service: Cardiovascular;  Laterality: Left;  Left superficial femoral   TEE WITHOUT CARDIOVERSION [name] 07/06/2016   Procedure: TRANSESOPHAGEAL ECHOCARDIOGRAM (TEE);  Surgeon: Ivin Poot, MD;  Location: [place];  Service: Open Heart Surgery;  Laterality: [name];   TONSILLECTOMY      There were no vitals filed for this visit.   Subjective Assessment - 11/08/21 1648     Subjective Patient has new sores on her stomach that just began yesterday from prolonged sitting. Is treating them. No falls or LOB since last session. Had three visits left approved for the year.    Pertinent History Pt presented to [facility]/24/21 c c/c of slurred speech, L facial droop & LUE weakness. MRI revealed acute R pontine perforator infarct with chronic R parietal & occipital infarcts. PMH: HTN, HLD, DM, tobacco abuse, PAD, CAD, CABG, dCHF, peptic foot drop, renal artery stenosis, MI and CAD (s/p of R CEA 2017), PTSD, adjustment disorder, obesity, Left hip OA s/p OPPT 2020. Pt DC 06/28/21 to inpatient rehab in [place], [place] to home 08/01/21. AT CIR pt worked extensively on STS with MinA or Supervision and HW, Stand and squat pivot transfers with MinA, AMB progressed to ~43ft max, mostly with HW (some RW use with fixed LUE on handle),  but was mostly limited in further progression of distance 2/2 chronic PAD ischaemic pain or Left knee buckling despite AFO use. CIR recommending update to more supporting AFO at transfer to OPPT. Since home, pt is using WC to navigate the environment, family assists with ADL performance, WC still needs to be modified to fit through BR doorframe. Pt reports not feeling safe with HW at present. Husband has been helping with gnelte stretching in mornings as pt sleeps in a flexion synergy posture on LLE.    Limitations Walking;Standing;House hold activities    How long can you sit comfortably? Generally well tolerated with occasional buttocks soreness    How long can you stand comfortably? At eval: tolerates <2 minutes prior to ischemic leg pain    How long can you walk comfortably? Longest distance at CIR last month ~31ft.    Patient Stated Goals Return to independent ambulation    Currently in  Pain? No/denies                    Goals:   5x STS: 26 seconds with SUE support  Ambulate 15 ft with AFO and HW  FOTO 42 SPT : able to perform with SPT  TUG: 56.38 seconds  New:  Standing tolerance goal-stand at sink and do dish, stand at counter cook 6 min walk test: 97 ft with hemiwalker    Treatment; Ascend/descend 4 steps with R railing only: step to patterning with max cueing for R ascending leading, L descending leading "good go up, down go bad"   Patient demonstrates excellent progression towards functional goals as well as meeting multiple goals allowing for progression. Her times have been cut in half for mobility and ambulation indicating great strides of improvement. She does continue to have impairments that will require focused physical therapy to increase independence and decrease falls risk. New goals addressing stability and mobility are added to POC. Patient scheduling frequency decreased due to limited visit count allowed per insurance this year however will recert for  longer duration however as visit count will begin again in new year allowing for increased visit numbers. Patient will continue to benefit from skilled physical therapy intervention in order to improve her balance, improve her ambulatory capacity, improve her safety with transfers, and improve her overall quality of life                  PT Education - 11/08/21 1649     Education Details goals, POC, stairs    Person(s) Educated Patient;Spouse    Methods Explanation;Demonstration;Tactile cues;Verbal cues    Comprehension Verbalized understanding;Returned demonstration;Verbal cues required;Tactile cues required              PT Short Term Goals - 11/08/21 1624       PT SHORT TERM GOAL #1   Title Pt to demonstrate improved tolerance to standing >3 minutes without physical assist or LOB. DME ad lib    Baseline <2 minutes at eval    Time 4    Period Weeks    Status Achieved    Target Date 08/31/21      PT SHORT TERM GOAL #2   Title Pt to demonstrate perfromance of 5xSTS c AFO/Hemi walker at supervision level independence without LOB or physical assist.    Baseline Needs minGuard to MinA to perform at eval, CGA level, not safe at supervision level at this point, 44.55 sec 11/16: 26 seconds with SUE support    Time 4    Period Weeks    Status Achieved    Target Date 08/31/21      PT SHORT TERM GOAL #3   Title Pt to demonstrate AMB ~57ft at supervision level with AFO, HW without LOB.    Baseline Not met, CGA required for safety 11/16: 150 ft with one rest break on 11/3    Time 6    Period Weeks    Status Achieved    Target Date 09/14/21      PT SHORT TERM GOAL #4   Title Pt to demonstrate TUG test <120sec c HW and AFO at minguard - MinA level to improve independence with transfers and reduce falls risk at home.    Baseline No tug performed at eval, 100.59 sec with CGA 11/16: 56.38 seconds    Time 7    Period Weeks    Status Achieved    Target Date 09/21/21  PT Long Term Goals - 11/08/21 1644       PT LONG TERM GOAL #1   Title Pt to demonstrate perfromance of 5xSTS c AFO/Hemi walker at supervision level independence without LOB or physical assist in less than 20sec.    Baseline Unable at eval, requires minguard to minA, PN 9/26: 44.5 sec with CGA for sfety 11/16: 26 seconds with SUE support    Time 12    Period Weeks    Status Partially Met    Target Date 01/31/22      PT LONG TERM GOAL #2   Title Pt to demonstrate FOTO score improvement >10 points to demonstrate improved perception of ability in ADL    Baseline 23 at eval:53 at PN, revised to increase by additional 10 11/6: 42%    Time 12    Period Weeks    Status Partially Met    Target Date 01/31/22      PT LONG TERM GOAL #3   Title Pt able to demonstrate stand pivot transfer with HW and AFO without LOB at supervision level to modI.    Baseline SBA for safety 11/16: able to perform with CGA/SBA    Time 8    Period Weeks    Status Achieved      PT LONG TERM GOAL #4   Title Pt to demonstrate TUG test <60sec c HW and AFO at supervision level to improve independence with transfers and reduce falls risk at home.    Baseline Tug in 100.59 sec 11/16: 56.38 seconds with HW    Time 8    Period Weeks    Status Achieved      PT LONG TERM GOAL #5   Title Pt to demonstrate TUG test <20sec c HW and AFO at supervision level to improve independence with transfers and reduce falls risk at home.    Baseline 11/16: 56.38 seconds with HW    Time 12    Period Weeks    Status New    Target Date 01/31/22      Additional Long Term Goals   Additional Long Term Goals Yes      PT LONG TERM GOAL #6   Title Patient will increase standing tolerance to >15 minutes in order to perform IADLs such as washing a dish or assisting in cooking without LOB    Baseline 11/16: test next session    Time 12    Period Weeks    Status New    Target Date 01/31/22      PT LONG TERM GOAL #7    Title Patient will ambulate > 400 ft during 6 minute walk test to increase functional mobility and capacity for ambulation for increased independence.    Baseline 11/16: 97 ft with two rest breaks with RW    Time 12    Period Weeks    Status New    Target Date 01/31/22                   Plan - 11/08/21 1651     Clinical Impression Statement Patient demonstrates excellent progression towards functional goals as well as meeting multiple goals allowing for progression. Her times have been cut in half for mobility and ambulation indicating great strides of improvement. She does continue to have impairments that will require focused physical therapy to increase independence and decrease falls risk. New goals addressing stability and mobility are added to POC. Patient scheduling frequency decreased due to limited visit count  allowed per insurance this year however will recert for longer duration however as visit count will begin again in new year allowing for increased visit numbers. Patient will continue to benefit from skilled physical therapy intervention in order to improve her balance, improve her ambulatory capacity, improve her safety with transfers, and improve her overall quality of life    Personal Factors and Comorbidities Age;Comorbidity 3+;Fitness;Past/Current Experience;Time since onset of injury/illness/exacerbation    Comorbidities DM, HTN, PVD, clotting disorder, CAD    Examination-Activity Limitations Squat;Stairs;Bathing;Bed Mobility;Dressing;Reach Overhead;Hygiene/Grooming;Bend;Carry;Locomotion Level;Transfers;Toileting;Stand    Examination-Participation Restrictions Cleaning;Community Activity;Occupation;Laundry;Driving;Yard Work    [employer] Evolving/Moderate complexity    Rehab Potential Good    PT Frequency 2x / week    PT Duration 12 weeks    PT Treatment/Interventions Electrical Stimulation;Gait training;Stair training;Functional mobility  training;Therapeutic activities;Therapeutic exercise;Balance training;Neuromuscular re-education;Patient/family education;Manual techniques;Wheelchair mobility training;Energy conservation;Passive range of motion;Orthotic Fit/Training;DME Instruction;ADLs/Self Care Home Management    PT Next Visit Plan work toward supervision level STS and SPT c HW, sustained standing tolerance.    PT Home Exercise Plan Not updated this visit- Encouraged seated LE strengthening and practicing gait with husband.;  No updates    Consulted and Agree with Plan of Care Patient;Family member/caregiver    Family Member Consulted husband             Patient will benefit from skilled therapeutic intervention in order to improve the following deficits and impairments:  Decreased endurance, Decreased mobility, Difficulty walking, Hypomobility, Decreased balance, Abnormal gait, Increased muscle spasms, Cardiopulmonary status limiting activity, Impaired flexibility, Hypermobility, Decreased strength, Decreased activity tolerance  Visit Diagnosis: Other abnormalities of gait and mobility  Muscle weakness (generalized)  Unsteadiness on feet     Problem List Patient Active Problem List   Diagnosis Date Noted   Cerebrovascular accident (CVA) due to occlusion of vertebral artery ([place]) 09/11/2021   Cognitive change 09/11/2021   Cognitive communication deficit 08/10/2021   Urinary incontinence 08/10/2021   GERD (gastroesophageal reflux disease) 08/03/2021   Chronic post-traumatic stress disorder (PTSD)    Depression with anxiety    Right pontine stroke ([place]) 06/28/2021   Hemiplegia and hemiparesis following cerebral infarction affecting left non-dominant side ([place]) 06/24/2021   Chronic diastolic CHF (congestive heart failure) ([place]) 06/24/2021   Fall 06/24/2021   Abnormal LFTs 06/24/2021   Olecranon bursitis of left elbow 06/12/2021   Chronic hip pain (Bilateral) 06/09/2021   Chronic use of opiate for therapeutic  purpose 05/09/2021   Greater trochanteric bursitis of hip (Left) 03/09/2021   Bursitis of hip (Left) 74/94/4967   Uncomplicated opioid dependence ([place]) 02/20/2021   Acute conjunctivitis of left eye 01/02/2021   Unintentional weight loss 11/21/2020   Broken teeth (Right) 08/02/2020   History of MI (myocardial infarction) (July 2017) 08/02/2020   Neuropathy 06/09/2020   Dental abscess 06/09/2020   Foot drop (Left) 06/09/2020   Carotid stenosis, asymptomatic, right 04/27/2020   Chronic migraine 11/03/2019   Thrombocytosis 09/16/2019   Erythrocytosis 09/16/2019   Hypercalcemia 09/06/2019   Leukocytosis 09/06/2019   Polycythemia 09/06/2019   Chronic hip pain (Left) 08/27/2019   Osteoarthritis of hip (Left) 08/27/2019   Gluteal tendonitis of buttock (Left) 08/27/2019   Radial nerve palsy (Right) 07/15/2019   Neuropathy of radial nerve (Right) 06/30/2019   Abnormal bruising 06/25/2019   History of carotid endarterectomy (Right) 03/04/2019   Pain medication agreement signed 03/04/2019   Atypical facial pain (Right) 01/27/2019   Chronic ear pain (Right) 01/27/2019   Chronic jaw pain (Right) 01/27/2019  Geniculate Neuralgia (Right) 01/27/2019   Vitamin D deficiency 01/19/2019   Neurogenic pain 01/19/2019   Chronic anticoagulation (PLAVIX) 01/19/2019   Chronic pain syndrome 01/07/2019   Long term current use of opiate analgesic 01/07/2019   Long term prescription benzodiazepine use 01/07/2019   Pharmacologic therapy 01/07/2019   Disorder of skeletal system 01/07/2019   Problems influencing health status 01/07/2019   Chronic headaches (1ry area of Pain) (Right) 01/07/2019   Opiate use 09/24/2018   PVD (peripheral vascular disease) ([place]) 06/24/2018   Chronic ankle pain (Bilateral) 12/27/2017   Tendinopathy of gluteus medius (Right) 12/27/2017   Tendinopathy of gluteus medius (Left) 12/27/2017   Bilateral hip pain 12/26/2017   Chronic elbow pain (Left) 10/17/2017   Elevated troponin  I level 05/21/2017   Major depressive disorder, recurrent episode, moderate ([place]) 11/19/2016   Insomnia 10/30/2016   Constipation 07/25/2016   S/P CABG x 4 07/06/2016   Bradycardia    CAD (coronary artery disease)    Carotid stenosis    CAD in native artery 06/29/2016   Elevated troponin 06/28/2016   Essential hypertension, malignant 06/28/2016   Mild tobacco abuse in early remission 06/28/2016   Essential hypertension    Malignant hypertension    Type 2 diabetes mellitus with other specified complication ([place])    Chest pain with high risk for cardiac etiology 06/27/2016   NSTEMI (non-ST elevated myocardial infarction) ([place]) 06/27/2016   Proteinuria 03/15/2016   Renal artery stenosis ([place]) 03/15/2016   MDD (major depressive disorder) 10/17/2015   Agoraphobia with panic attacks 04/25/2015   HTN (hypertension), malignant 10/20/2013   Cluster headache 03/20/2012   HLD (hyperlipidemia) 07/26/2010   ADJUSTMENT DISORDER WITH MIXED FEATURES 07/26/2010    Janna Arch, PT, DPT  11/08/2021, 4:52 PM  [place] MAIN [facility] SERVICES [address] [place], [place], [address] Phone: [phone]   Fax:  [phone]  Name: Erika [name] MRN: [id number] Date of Birth: 04/30/1971

## 2021-11-09 NOTE — Therapy (Signed)
[place] MAIN [facility] SERVICES [address] [place], [place], [address] Phone: [phone]   Fax:  [phone]  Occupational Therapy Treatment  Patient Details  Name: Erika [name] MRN: [id number] Date of Birth: Jul 15, 1971 Referring Provider (OT): Dr. Leeroy Cha   Encounter Date: 11/08/2021   OT End of Session - 11/09/21 0812     Visit Number 17    Number of Visits 44    Date for OT Re-Evaluation 01/18/22    Authorization Type limited to 20 visits combined/multidiscipline    Authorization Time Period Reporting period starting 09/21/2021    OT Start Time 1515    OT Stop Time 1600    OT Time Calculation (min) 45 min    Equipment Utilized During Treatment wc    Activity Tolerance Patient tolerated treatment well    Behavior During Therapy [facility] for tasks assessed/performed             Past Medical History:  Diagnosis Date   Arthralgia of temporomandibular joint    CAD, multiple vessel    a. 06/2016 Cath: ostLM 40%, ostLAD 40%, pLAD 95%, ost-pLCx 60%, pLCx 95%, mLCx 60%, mRCA 95%, D2 50%, LVSF nl;  b. 07/2016 CABG x 4 (LIMA->LAD, VG->Diag, VG->OM, VG->RCA); c. 08/2016 Cath: 3VD w/ 4/4 patent grafts. LAD distal to LIMA has diff dzs->Med rx; d. 08/2020 Cath: 4/4 patent grafts, native 3VD. EF 55-65%-->Med Rx.   Carotid arterial disease ([place])    a. 07/2016 s/p R CEA; b. 02/2021 U/S: RICA 32-35%, LICA 5-73%.   Clotting disorder ([place])    Depression    Diastolic dysfunction    a. 06/2016 Echo: EF 50-55%, mild inf wall HK, GR1DD, mild MR, RV sys fxn nl, mildly dilated LA, PASP nl   Fatty liver disease, nonalcoholic 2202   History of blood transfusion    with heart surgery   HLD (hyperlipidemia)    Labile hypertension    a. prior renal ngiogram negative for RAS in 03/2016; b. catecholamines and metanephrines normal, mildly elevated renin with normal aldosterone and normal ratio in 02/2016   Myocardial infarction [facility]) 2017   Obesity     PAD (peripheral artery disease) ([place])    a. 09/2018 s/p L SFA stenting; b. 07/2019 Periph Angio: Patent m/d L SFA stent w/ 100% L SFA distal to stent. L AT 100d, L Peroneal diff dzs-->Med Rx; c. 02/2021 ABIs: stable @ 0.61 on R and 0.46 on L.   PTSD (post-traumatic stress disorder)    Tobacco abuse    Type 2 diabetes mellitus ([place]) 12/2015    Past Surgical History:  Procedure Laterality Date   ABDOMINAL AORTOGRAM W/LOWER EXTREMITY [name] 10/15/2018   Procedure: ABDOMINAL AORTOGRAM W/LOWER EXTREMITY;  Surgeon: Wellington Hampshire, MD;  Location: [place] [facility];  Service: Cardiovascular;  Laterality: [name];   ABDOMINAL AORTOGRAM W/LOWER EXTREMITY Bilateral 08/19/2019   Procedure: ABDOMINAL AORTOGRAM W/LOWER EXTREMITY;  Surgeon: Wellington Hampshire, MD;  Location: [place] [facility];  Service: Cardiovascular;  Laterality: Bilateral;   CARDIAC CATHETERIZATION [name] 06/29/2016   Procedure: Left Heart Cath and Coronary Angiography;  Surgeon: Minna Merritts, MD;  Location: [place] [facility];  Service: Cardiovascular;  Laterality: [name];   CARDIAC CATHETERIZATION [name] 08/29/2016   Procedure: Left Heart Cath and Cors/Grafts Angiography;  Surgeon: Wellington Hampshire, MD;  Location: [place] [facility];  Service: Cardiovascular;  Laterality: [name];   CESAREAN SECTION     CHOLECYSTECTOMY     CORONARY ARTERY BYPASS  GRAFT [name] 07/06/2016   Procedure: CORONARY ARTERY BYPASS GRAFTING (CABG) x four, using left internal mammary artery and right leg greater saphenous vein harvested endoscopically;  Surgeon: Ivin Poot, MD;  Location: [place];  Service: Open Heart Surgery;  Laterality: [name];   ENDARTERECTOMY Right 07/06/2016   Procedure: ENDARTERECTOMY CAROTID;  Surgeon: Rosetta Posner, MD;  Location: [facility] OR;  Service: Vascular;  Laterality: Right;   ENDARTERECTOMY Right 04/27/2020   Procedure: REDO OF RIGHT ENDARTERECTOMY CAROTID;  Surgeon: Rosetta Posner, MD;  Location: [facility] OR;  Service: Vascular;  Laterality: Right;   LEFT HEART  CATH AND CORS/GRAFTS ANGIOGRAPHY [name] 08/24/2020   Procedure: LEFT HEART CATH AND CORS/GRAFTS ANGIOGRAPHY;  Surgeon: Wellington Hampshire, MD;  Location: [place] [facility];  Service: Cardiovascular;  Laterality: [name];   PERIPHERAL VASCULAR CATHETERIZATION [name] 04/18/2016   Procedure: Renal Angiography;  Surgeon: Wellington Hampshire, MD;  Location: [place] [facility];  Service: Cardiovascular;  Laterality: [name];   PERIPHERAL VASCULAR INTERVENTION Left 10/15/2018   Procedure: PERIPHERAL VASCULAR INTERVENTION;  Surgeon: Wellington Hampshire, MD;  Location: [place] [facility];  Service: Cardiovascular;  Laterality: Left;  Left superficial femoral   TEE WITHOUT CARDIOVERSION [name] 07/06/2016   Procedure: TRANSESOPHAGEAL ECHOCARDIOGRAM (TEE);  Surgeon: Ivin Poot, MD;  Location: [place];  Service: Open Heart Surgery;  Laterality: [name];   TONSILLECTOMY      There were no vitals filed for this visit.   Subjective Assessment - 11/08/21 0809     Subjective  Pt reports increased pain throughout LUE.    Patient is accompanied by: Family member    Pertinent History HX of CAD, CABG, DM2, s/p R pontine stroke    Limitations L sided hemiplegia, impulsivity, judgment, safety awareness    Patient Stated Goals "I want to be able to take care of myself."    Currently in Pain? Yes    Pain Score 8     Pain Location Arm    Pain Orientation Left    Pain Descriptors / Indicators Aching    Pain Type Chronic pain    Pain Radiating Towards L hand digits, wrist, elbow, upper arm    Pain Onset More than a month ago    Pain Frequency Intermittent    Aggravating Factors  LUE mobility    Pain Relieving Factors pain meds, positioning    Effect of Pain on Daily Activities pain when moving LUE    Multiple Pain Sites No           Occupational Therapy Treatment: Neuro re-ed: Performed L hand squeezes (2 sets 10 reps) with alternating WB and passive wrist and digit extension stretch.  Provided facilitatory muscle tapping to L  wrist and digit extensors following each hand squeeze.    Therapeutic Exercise: Pt participated in seated UBE x 2.5 min forward rotation x 2.5 min reverse rotation with min resistance, working to facilitate reciprocal motion and increase BUE strength and activity tolerance for self care.  OT provided support at L wrist and elbow throughout while hand was secured to pedal with ACE wrap.  Performed active assisted L scapular mobility all planes, PROM/AAROM for L shoulder, elbow, wrist, and hand.  Prolonged stretch for L shoulder ER within pain tolerance.  Performed table slides for horiz abd/add, and forward flexion x2 sets 10 reps each.  Reviewed PROM throughout LUE with pt and spouse; handout issued.   Response to Treatment: Pt reports increased pain throughout LUE and not wanting to move  arm from her arm trough throughout the day.  Reinforced importance of daily ROM completed by pt or caregiver to minimize pain from lack of mobility.  Handout issued with demo; will continue to review with pt and caregiver.  Pt reports that she is regularly helping to set the table for meals and helps with meal prep by verbally helping to guide daughter in the kitchen.  Pt has 2 remaining sessions for this calendar year but will plan to pick back up in the new year.  Remaining visits will continue to focus on LUE positioning, pain management, HEP review, and providing neuro re-ed throughout LUE to facilitate muscle activation for increased functional use of LUE for daily activities.      OT Education - 11/08/21 [phone]     Education Details pt and caregiver PROM throughout LUE    Person(s) Educated Patient;Spouse    Methods Explanation;Verbal cues;Handout;Demonstration;Tactile cues    Comprehension Verbalized understanding;Need further instruction;Verbal cues required              OT Short Term Goals - 10/26/21 1526       OT SHORT TERM GOAL #1   Title Pt will be perform HEP for LUE with min vc.    Baseline  10/26/2021: Pt. requires verbal cues,, tactile assits, and cues for visual demonstration.10th visit: Pt. requires assist to perform Self-ROM. Eval: Initiated at eval, further instruction needed    Time 6    Period Weeks    Status On-going    Target Date 12/07/21               OT Long Term Goals - 10/26/21 1530       OT LONG TERM GOAL #1   Title Pt will increase FOTO score to 40 or better to indicate increased functional performance    Baseline 10/26/2021: FOTO score: 33, TR score: 56 Eval: 23    Time 12    Period Weeks    Status New    Target Date 01/18/22      OT LONG TERM GOAL #2   Title Pt will perform UB ADLs with set up    Baseline 10/26/2021: Indepent doffing, and minA donning. 10th vist: independent doffing a shirt, MinA donning    Time 12    Period Weeks    Status On-going    Target Date 01/18/22      OT LONG TERM GOAL #3   Title Pt will perform LB ADLs with min A    Baseline 1103/2022: independent donning pants over feet, minA standing to hike pants with the right hand, Independent doffing socks, maxA donning socks, Mod I ndepent donning slide on shoes with a shoehorn. 10th visit: Pt. is able to donn pants with minA hiking the back waistband. Pt. requires maxA donning shoes, ans socks. Eval: Max A to perform LB ADLs    Time 12    Period Weeks    Status On-going    Target Date 01/18/22      OT LONG TERM GOAL #4   Title Pt will perform ADL transfers with distant supv    Baseline 10/26/2021: CGA, 10th visit: CGA. Eval: min A-mod A to perform ADL transfers    Time 12    Period Weeks    Status On-going    Target Date 01/18/22      OT LONG TERM GOAL #5   Title Pt will increase LUE strength by 2 MM grades to work towards use of LUE as a stabilizer  for self care.    Baseline 12/26/2020: limited engagement of the LUE as a gross assist.10th visit: Pt. is starting to engage her left hand as a stabilizer.: L shoulder 1/5, elbow/wrist/hand 0/5.  Unable to use LUE as a  stabilzer for self care.    Time 12    Period Weeks    Status On-going    Target Date 01/18/22      OT LONG TERM GOAL #6   Title Pt. will use her left hand as a gross stabilizer during IADL kitchen tasks.    Baseline 10/26/2021: Pt. is not using her left hand as agross stabilizer. duirng IADL kitchen tasks.    Time 12    Period Weeks    Status On-going    Target Date 01/18/22                Plan - 11/09/21 0828     Clinical Impression Statement Pt reports increased pain throughout LUE and not wanting to move arm from her arm trough throughout the day.  Reinforced importance of daily ROM completed by pt or caregiver to minimize pain from lack of mobility.  Handout issued with demo; will continue to review with pt and caregiver.  Pt reports that she is regularly helping to set the table for meals and helps with meal prep by verbally helping to guide daughter in the kitchen.  Pt has 2 remaining sessions for this calendar year but will plan to pick back up in the new year.  Remaining visits will continue to focus on LUE positioning, pain management, HEP review, and providing neuro re-ed throughout LUE to facilitate muscle activation for increased functional use of LUE for daily activities.    OT Occupational Profile and History Detailed Assessment- Review of Records and additional review of physical, cognitive, psychosocial history related to current functional performance    Occupational performance deficits (Please refer to evaluation for details): ADL's;IADL's;Leisure;Work    Marketing executive / Function / Physical Skills ADL;Continence;Dexterity;Strength;Vision;Balance;Coordination;FMC;IADL;Body mechanics;Endurance;Gait;UE functional use;Decreased knowledge of use of DME;GMC;Mobility    Rehab Potential Good    Clinical Decision Making Several treatment options, min-mod task modification necessary    Comorbidities Affecting Occupational Performance: May have comorbidities impacting  occupational performance    Modification or Assistance to Complete Evaluation  Min-Moderate modification of tasks or assist with assess necessary to complete eval    OT Frequency 2x / week    OT Duration 12 weeks    OT Treatment/Interventions Self-care/ADL training;Therapeutic exercise;DME and/or AE instruction;Functional Mobility Training;Balance training;Electrical Stimulation;Neuromuscular education;Manual Therapy;Splinting;Visual/perceptual remediation/compensation;Moist Heat;Passive range of motion;Therapeutic activities;Patient/family education    Consulted and Agree with Plan of Care Patient;Family member/caregiver    Family Member Consulted spouse, [name]             Patient will benefit from skilled therapeutic intervention in order to improve the following deficits and impairments:   Body Structure / Function / Physical Skills: ADL, Continence, Dexterity, Strength, Vision, Balance, Coordination, FMC, IADL, Body mechanics, Endurance, Gait, UE functional use, Decreased knowledge of use of DME, GMC, Mobility       Visit Diagnosis: Muscle weakness (generalized)  Other lack of coordination  Hemiplegia and hemiparesis following cerebral infarction affecting left non-dominant side [facility])    Problem List Patient Active Problem List   Diagnosis Date Noted   Cerebrovascular accident (CVA) due to occlusion of vertebral artery ([place]) 09/11/2021   Cognitive change 09/11/2021   Cognitive communication deficit 08/10/2021   Urinary incontinence 08/10/2021   GERD (gastroesophageal  reflux disease) 08/03/2021   Chronic post-traumatic stress disorder (PTSD)    Depression with anxiety    Right pontine stroke ([place]) 06/28/2021   Hemiplegia and hemiparesis following cerebral infarction affecting left non-dominant side (HCC) 06/24/2021   Chronic diastolic CHF (congestive heart failure) ([place]) 06/24/2021   Fall 06/24/2021   Abnormal LFTs 06/24/2021   Olecranon bursitis of left elbow 06/12/2021    Chronic hip pain (Bilateral) 06/09/2021   Chronic use of opiate for therapeutic purpose 05/09/2021   Greater trochanteric bursitis of hip (Left) 03/09/2021   Bursitis of hip (Left) 35/46/5681   Uncomplicated opioid dependence ([place]) 02/20/2021   Acute conjunctivitis of left eye 01/02/2021   Unintentional weight loss 11/21/2020   Broken teeth (Right) 08/02/2020   History of MI (myocardial infarction) (July 2017) 08/02/2020   Neuropathy 06/09/2020   Dental abscess 06/09/2020   Foot drop (Left) 06/09/2020   Carotid stenosis, asymptomatic, right 04/27/2020   Chronic migraine 11/03/2019   Thrombocytosis 09/16/2019   Erythrocytosis 09/16/2019   Hypercalcemia 09/06/2019   Leukocytosis 09/06/2019   Polycythemia 09/06/2019   Chronic hip pain (Left) 08/27/2019   Osteoarthritis of hip (Left) 08/27/2019   Gluteal tendonitis of buttock (Left) 08/27/2019   Radial nerve palsy (Right) 07/15/2019   Neuropathy of radial nerve (Right) 06/30/2019   Abnormal bruising 06/25/2019   History of carotid endarterectomy (Right) 03/04/2019   Pain medication agreement signed 03/04/2019   Atypical facial pain (Right) 01/27/2019   Chronic ear pain (Right) 01/27/2019   Chronic jaw pain (Right) 01/27/2019   Geniculate Neuralgia (Right) 01/27/2019   Vitamin D deficiency 01/19/2019   Neurogenic pain 01/19/2019   Chronic anticoagulation (PLAVIX) 01/19/2019   Chronic pain syndrome 01/07/2019   Long term current use of opiate analgesic 01/07/2019   Long term prescription benzodiazepine use 01/07/2019   Pharmacologic therapy 01/07/2019   Disorder of skeletal system 01/07/2019   Problems influencing health status 01/07/2019   Chronic headaches (1ry area of Pain) (Right) 01/07/2019   Opiate use 09/24/2018   PVD (peripheral vascular disease) ([place]) 06/24/2018   Chronic ankle pain (Bilateral) 12/27/2017   Tendinopathy of gluteus medius (Right) 12/27/2017   Tendinopathy of gluteus medius (Left) 12/27/2017    Bilateral hip pain 12/26/2017   Chronic elbow pain (Left) 10/17/2017   Elevated troponin I level 05/21/2017   Major depressive disorder, recurrent episode, moderate (HCC) 11/19/2016   Insomnia 10/30/2016   Constipation 07/25/2016   S/P CABG x 4 07/06/2016   Bradycardia    CAD (coronary artery disease)    Carotid stenosis    CAD in native artery 06/29/2016   Elevated troponin 06/28/2016   Essential hypertension, malignant 06/28/2016   Mild tobacco abuse in early remission 06/28/2016   Essential hypertension    Malignant hypertension    Type 2 diabetes mellitus with other specified complication ([place])    Chest pain with high risk for cardiac etiology 06/27/2016   NSTEMI (non-ST elevated myocardial infarction) ([place]) 06/27/2016   Proteinuria 03/15/2016   Renal artery stenosis ([place]) 03/15/2016   MDD (major depressive disorder) 10/17/2015   Agoraphobia with panic attacks 04/25/2015   HTN (hypertension), malignant 10/20/2013   Cluster headache 03/20/2012   HLD (hyperlipidemia) 07/26/2010   ADJUSTMENT DISORDER WITH MIXED FEATURES 07/26/2010   Leta Speller, MS, OTR/L  Darleene Cleaver, OT/L 11/09/2021, 8:29 AM  [place] MAIN [facility] SERVICES [address] [place], [place], [address] Phone: [phone]   Fax:  [phone]  Name: Erika [name] MRN: [id number] Date of  Birth: [birth year]

## 2021-11-09 NOTE — Telephone Encounter (Signed)
Attempted to schedule.  LMOV to call office.  ° °

## 2021-11-10 ENCOUNTER — Encounter
Payer: Managed Care, Other (non HMO) | Attending: Physical Medicine and Rehabilitation | Admitting: Physical Medicine and Rehabilitation

## 2021-11-10 ENCOUNTER — Other Ambulatory Visit: Payer: Self-pay

## 2021-11-10 ENCOUNTER — Encounter: Payer: Self-pay | Admitting: Physical Medicine and Rehabilitation

## 2021-11-10 VITALS — BP 143/84 | HR 69 | Temp 98.3°F | Ht 67.0 in | Wt 218.4 lb

## 2021-11-10 DIAGNOSIS — I10 Essential (primary) hypertension: Secondary | ICD-10-CM | POA: Diagnosis present

## 2021-11-10 DIAGNOSIS — I639 Cerebral infarction, unspecified: Secondary | ICD-10-CM | POA: Insufficient documentation

## 2021-11-10 DIAGNOSIS — G629 Polyneuropathy, unspecified: Secondary | ICD-10-CM | POA: Diagnosis present

## 2021-11-10 DIAGNOSIS — G43701 Chronic migraine without aura, not intractable, with status migrainosus: Secondary | ICD-10-CM | POA: Insufficient documentation

## 2021-11-10 MED ORDER — PREGABALIN 225 MG PO CAPS: 225.0000 mg | ORAL_CAPSULE | Freq: Two times a day (BID) | ORAL | 2 refills | Status: DC

## 2021-11-10 MED ORDER — BUTALBITAL-APAP-CAFFEINE 50-325-40 MG PO TABS: 1.0000 | ORAL_TABLET | Freq: Every day | ORAL | 0 refills | Status: DC | PRN

## 2021-11-10 MED ORDER — CYCLOBENZAPRINE HCL 10 MG PO TABS: 10.0000 mg | ORAL_TABLET | Freq: Every day | ORAL | 0 refills | Status: DC

## 2021-11-10 NOTE — Progress Notes (Signed)
Subjective:    Patient ID: Erika [name], female    DOB: 12-Nov-1971, 50 y.o.   MRN: [id number]  HPI Erika [name] is a 50 year old woman who presents for follow-up of CVA and headaches.   1) CVA -She has been doing well at home, but at times feels she is a burden to her husband and family despite his reassurance that she is not -walking from bedroom and bathroom every day  -she walked up to 150 feet yesterday with therapy! -spouse has been able to work at home since this happened.   2) Type 2 DM: -previously sent scripts for for DexCom receiver, sensory, and transmitter- her daughter has this so she is familiar with its use -Her CBGs have recently been between 120 and 130  3) Migraines -migraines continue to be severe, present every day, despite use of oxycodone and Lyrica. -she has had benefit from Fioricet, though less than with oxycodone, but she also is less confused than with the oxycodone so her husband prefers the Fioricet to the oxycodone.  4) Urinary incontinence -having about one episode of urinary incontinence daily, usually at night -discussed pros and cons of Botox.   5) Quadriceps weakness -she had a fall last week when using the bedside commode- her legs buckled. She has had no more falls since then  6) PAD -she would like to try Qutenza today -follows with vascular surgery and although she has PAD in both legs, she is only symptomatic in her right cald  7) Left arm weakness -she has not had any return in strength yet -she never received a TENs unit    Pain Inventory Average Pain 7 Pain Right Now 7 My pain is constant and dull  LOCATION OF PAIN  head  BOWEL Number of stools per week: 3 Oral laxative use Yes  Type of laxative miralax   BLADDER  Bladder incontinence Yes    Mobility ability to climb steps?  no do you drive?  no use a wheelchair needs help with transfers Do you have any goals in this area?  yes  Function disabled:  date disabled . I need assistance with the following:  dressing, bathing, toileting, meal prep, household duties, and shopping  Neuro/Psych bladder control problems bowel control problems trouble walking spasms confusion depression anxiety  Prior Studies Any changes since last visit?  no  Physicians involved in your care Any changes since last visit?  no Primary care sees primary tomorrow Neurologist has not heard from [place] yet   Family History  Adopted: Yes  Problem Relation Age of Onset   Diabetes Mother    Diabetes Father    Alcohol abuse Father    Heart disease Father    Drug abuse Father    Stroke Sister    Anxiety disorder Sister    Social History   Socioeconomic History   Marital status: Married    Spouse name: Not on file   Number of children: 1   Years of education: 14   Highest education level: Not on file  Occupational History   Occupation: Disability  Tobacco Use   Smoking status: Every Day    Packs/day: 0.50    Years: 27.00    Pack years: 13.50    Types: Cigarettes   Smokeless tobacco: Never  Vaping Use   Vaping Use: Former  Substance and Sexual Activity   Alcohol use: Not Currently    Alcohol/week: 0.0 standard drinks    Comment: socially  Drug use: No   Sexual activity: Yes    Partners: Male    Birth control/protection: None  Other Topics Concern   Not on file  Social History Narrative   11/21/20   From: [name]   Living: with husband, [name] (2009) and special needs daughter    Work: disability       Family: daughter - Erika [name] (special needs, lives with her) and 2 grown step children - Erika [name] and [name]       Enjoys: play with dogs - 2 german Shepard, mut, and blue tick walker mix      Exercise: PAD limits exercise   Diet: diabetic diet and low potassium due to daughter      Safety   Seat belts: Yes    Guns: Yes  and secure   Safe in relationships: Yes    Social Determinants of Health   Financial Resource  Strain: Not on file  Food Insecurity: Not on file  Transportation Needs: Not on file  Physical Activity: Not on file  Stress: Not on file  Social Connections: Not on file   Past Surgical History:  Procedure Laterality Date   ABDOMINAL AORTOGRAM W/LOWER EXTREMITY [name] 10/15/2018   Procedure: ABDOMINAL AORTOGRAM W/LOWER EXTREMITY;  Surgeon: Wellington Hampshire, MD;  Location: [facility];  Service: Cardiovascular;  Laterality: [name];   ABDOMINAL AORTOGRAM W/LOWER EXTREMITY Bilateral 08/19/2019   Procedure: ABDOMINAL AORTOGRAM W/LOWER EXTREMITY;  Surgeon: Wellington Hampshire, MD;  Location: [facility];  Service: Cardiovascular;  Laterality: Bilateral;   CARDIAC CATHETERIZATION [name] 06/29/2016   Procedure: Left Heart Cath and Coronary Angiography;  Surgeon: Minna Merritts, MD;  Location: [facility];  Service: Cardiovascular;  Laterality: [name];   CARDIAC CATHETERIZATION [name] 08/29/2016   Procedure: Left Heart Cath and Cors/Grafts Angiography;  Surgeon: Wellington Hampshire, MD;  Location: [facility];  Service: Cardiovascular;  Laterality: [name];   CESAREAN SECTION     CHOLECYSTECTOMY     CORONARY ARTERY BYPASS GRAFT [name] 07/06/2016   Procedure: CORONARY ARTERY BYPASS GRAFTING (CABG) x four, using left internal mammary artery and right leg greater saphenous vein harvested endoscopically;  Surgeon: Ivin Poot, MD;  Location: [facility];  Service: Open Heart Surgery;  Laterality: [name];   ENDARTERECTOMY Right 07/06/2016   Procedure: ENDARTERECTOMY CAROTID;  Surgeon: Rosetta Posner, MD;  Location: [facility] OR;  Service: Vascular;  Laterality: Right;   ENDARTERECTOMY Right 04/27/2020   Procedure: REDO OF RIGHT ENDARTERECTOMY CAROTID;  Surgeon: Rosetta Posner, MD;  Location: [facility];  Service: Vascular;  Laterality: Right;   LEFT HEART CATH AND CORS/GRAFTS ANGIOGRAPHY [name] 08/24/2020   Procedure: LEFT HEART CATH AND CORS/GRAFTS ANGIOGRAPHY;  Surgeon: Wellington Hampshire, MD;  Location: [facility];   Service: Cardiovascular;  Laterality: [name];   PERIPHERAL VASCULAR CATHETERIZATION [name] 04/18/2016   Procedure: Renal Angiography;  Surgeon: Wellington Hampshire, MD;  Location: [facility];  Service: Cardiovascular;  Laterality: [name];   PERIPHERAL VASCULAR INTERVENTION Left 10/15/2018   Procedure: PERIPHERAL VASCULAR INTERVENTION;  Surgeon: Wellington Hampshire, MD;  Location: [facility];  Service: Cardiovascular;  Laterality: Left;  Left superficial femoral   TEE WITHOUT CARDIOVERSION [name] 07/06/2016   Procedure: TRANSESOPHAGEAL ECHOCARDIOGRAM (TEE);  Surgeon: Ivin Poot, MD;  Location: [facility];  Service: Open Heart Surgery;  Laterality: [name];   TONSILLECTOMY     Past Medical History:  Diagnosis Date   Arthralgia of temporomandibular joint  CAD, multiple vessel    a. 06/2016 Cath: ostLM 40%, ostLAD 40%, pLAD 95%, ost-pLCx 60%, pLCx 95%, mLCx 60%, mRCA 95%, D2 50%, LVSF nl;  b. 07/2016 CABG x 4 (LIMA->LAD, VG->Diag, VG->OM, VG->RCA); c. 08/2016 Cath: 3VD w/ 4/4 patent grafts. LAD distal to LIMA has diff dzs->Med rx; d. 08/2020 Cath: 4/4 patent grafts, native 3VD. EF 55-65%-->Med Rx.   Carotid arterial disease ([place])    a. 07/2016 s/p R CEA; b. 02/2021 U/S: RICA 01-60%, LICA 1-09%.   Clotting disorder ([place])    Depression    Diastolic dysfunction    a. 06/2016 Echo: EF 50-55%, mild inf wall HK, GR1DD, mild MR, RV sys fxn nl, mildly dilated LA, PASP nl   Fatty liver disease, nonalcoholic 3235   History of blood transfusion    with heart surgery   HLD (hyperlipidemia)    Labile hypertension    a. prior renal ngiogram negative for RAS in 03/2016; b. catecholamines and metanephrines normal, mildly elevated renin with normal aldosterone and normal ratio in 02/2016   Myocardial infarction [facility]) 2017   Obesity    PAD (peripheral artery disease) ([place])    a. 09/2018 s/p L SFA stenting; b. 07/2019 Periph Angio: Patent m/d L SFA stent w/ 100% L SFA distal to stent. L AT 100d, L Peroneal diff dzs-->Med Rx;  c. 02/2021 ABIs: stable @ 0.61 on R and 0.46 on L.   PTSD (post-traumatic stress disorder)    Tobacco abuse    Type 2 diabetes mellitus ([place]) 12/2015   There were no vitals taken for this visit.  Opioid Risk Score:   Fall Risk Score:  `1  Depression screen PHQ 2/9  Depression screen [facility] 2/9 09/06/2021 08/09/2021 05/10/2021 02/23/2021 02/20/2021 11/23/2020 11/21/2020  Decreased Interest 3 2 0 0 0 0 2  Down, Depressed, Hopeless 3 3 0 0 0 0 3  PHQ - 2 Score 6 5 0 0 0 0 5  Altered sleeping - 0 - - - - 3  Tired, decreased energy - 3 - - - - 2  Change in appetite - 2 - - - - 2  Feeling bad or failure about yourself  - 3 - - - - 2  Trouble concentrating - 3 - - - - 2  Moving slowly or fidgety/restless - 2 - - - - 1  Suicidal thoughts - 0 - - - - 1  PHQ-9 Score - 18 - - - - 18  Difficult doing work/chores - Very difficult - - - - Very difficult  Some recent data might be hidden     Review of Systems  Constitutional: Negative.   HENT: Negative.    Eyes: Negative.   Respiratory: Negative.    Cardiovascular: Negative.   Gastrointestinal:        Bowel control  Endocrine: Negative.   Genitourinary:        Bladder control  Musculoskeletal:  Positive for gait problem.  Skin: Negative.   Allergic/Immunologic: Negative.   Neurological:  Positive for weakness.  Hematological:  Bruises/bleeds easily.       Plavix  Psychiatric/Behavioral:  Positive for confusion and dysphoric mood. The patient is nervous/anxious.   All other systems reviewed and are negative.     Objective:   Physical Exam Patient appears to be comfortable, alert and oriented, able to answer questions well.       Assessment & Plan:  Mrs. Scholler is a very pleasant 50 year old woman who presents for f/u of fall  post-CVA, and chronic headaches.   1) CVA: Encouraged increasing walking distance every day -referred to neurology for follow-up, discussed that she was prescribed Vascepa yesterday by Dr. Felecia Shelling.  -continue  home therapies  2) Migraines -failed multiple medications -present every day -discussed Botox, she is wary of getting needles in her face but will consider.  -UDS and pain contract signed -continue lyrica 225mg  BID, discussed we will try to wean next visit.  -made goal to replace soft drinks with electrolyte water -discontinue oxycodone. Replace with Fioricet once per day.  -refilled Fioricet.   3) HTN: -BP is 143/84 today.  -given her higher recommended BP goal given her stenosis, recommended discussing with her cardiologist next week decreasing her Coreg.  -Advised checking BP daily at home and logging results to bring into follow-up appointment with her PCP and myself. -Reviewed BP meds today.  -Advised regarding healthy foods that can help lower blood pressure and provided with a list: 1) citrus foods- high in vitamins and minerals 2) salmon and other fatty fish - reduces inflammation and oxylipins 3) swiss chard (leafy green)- high level of nitrates 4) pumpkin seeds- one of the best natural sources of magnesium 5) Beans and lentils- high in fiber, magnesium, and potassium 6) Berries- high in flavonoids 7) Amaranth (whole grain, can be cooked similarly to rice and oats)- high in magnesium and fiber 8) Pistachios- even more effective at reducing BP than other nuts 9) Carrots- high in phenolic compounds that relax blood vessels and reduce inflammation 10) Celery- contain phthalides that relax tissues of arterial walls 11) Tomatoes- can also improve cholesterol and reduce risk of heart disease 12) Broccoli- good source of magnesium, calcium, and potassium 13) Greek yogurt: high in potassium and calcium 14) Herbs and spices: Celery seed, cilantro, saffron, lemongrass, black cumin, ginseng, cinnamon, cardamom, sweet basil, and ginger 15) Chia and flax seeds- also help to lower cholesterol and blood sugar 16) Beets- high levels of nitrates that relax blood vessels  17) spinach and  bananas- high in potassium  -Provided lise of supplements that can help with hypertension:  1) magnesium: one high quality brand is Bioptemizers since it contains all 7 types of magnesium, otherwise over the counter magnesium gluconate 400mg  is a good option 2) B vitamins 3) vitamin D 4) potassium 5) CoQ10 6) L-arginine 7) Vitamin C 8) Beetroot -Educated that goal BP is 120/80. -Made goal to incorporate some of the above foods into diet.   4) Diabetes: -check CBGs daily, log, and bring log to follow-up appointment -try to incorporate into your diet some of the following foods which are good for diabetes: 1) cinnamon- imitates effects of insulin, increasing glucose transport into cells ([place] or [place] cinnamon is best, least processed) 2) nuts- can slow down the blood sugar response of carbohydrate rich foods 3) oatmeal- contains and anti-inflammatory compound avenanthramide 4) whole-milk yogurt (best types are no sugar, [place] yogurt, or goat/sheep yogurt) 5) beans- high in protein, fiber, and vitamins, low glycemic index 6) broccoli- great source of vitamin A and C 7) quinoa- higher in protein and fiber than other grains 8) spinach- high in vitamin A, fiber, and protein 9) olive oil- reduces glucose levels, LDL, and triglycerides 10) salmon- excellent amount of omega-3-fatty acids 11) walnuts- rich in antioxidants 12) apples- high in fiber and quercetin 13) carrots- highly nutritious with low impact on blood sugar 14) eggs- improve HDL (good cholesterol), high in protein, keep you satiated 15) turmeric: improves blood sugars, cardiovascular disease, and protects  kidney health 16) garlic: improves blood sugar, blood pressure, pain 17) tomatoes: highly nutritious with low impact on blood sugar   5) left foot drop: continue AFO ordered from hangar  6) Left sided post-stroke shoulder pain --Discussed Sprint PNS system as an option of pain treatment via neuromodulation. Provided  following link for patient to learn more about the system: https://www.sprtherapeutics.com/. Explained mechanism of activating A beta fibers which function in touch, pressure, and vibration, to inhibit the sensations of A delta and C fibers which are responsible for pain transmission.   -continue therapy -E-stim ordered due to flaccidity, atrophy, pain- discussed where e-stim should be placed  7) Quadriceps weakness contributing to fall- recommended seated quadriceps strengthening exercises daily. Continue PT and OT  8) Insomnia: resolved, discontinue trazodone.  9) Multiple falls: CT Head ordered to assess for new stroke, CXR ordered to assess for new pneumonia: discussed with patient that both are negative. Likely cause of falls is quadriceps weakness as in #7.  10) PAD --Discussed Qutenza as an option for neuropathic pain control. Discussed that this is a capsaicin patch, stronger than capsaicin cream. Discussed that it is currently approved for diabetic peripheral neuropathy and post-herpetic neuralgia, but that it has also shown benefit in treating other forms of neuropathy. Provided patient with link to site to learn more about the patch: [employer].fr. Discussed that the patch would be placed in office and benefits usually last 3 months. Discussed that unintended exposure to capsaicin can cause severe irritation of eyes, mucous membranes, respiratory tract, and skin, but that Qutenza is a local treatment and does not have the systemic side effects of other nerve medications. Discussed that there may be pain, itching, erythema, and decreased sensory function associated with the application of Qutenza. Side effects usually subside within 1 week. A cold pack of analgesic medications can help with these side effects. Blood pressure can also be increased due to pain associated with administration of the patch.  2 patches of Qutenza was applied to the area of pain. Ice packs were applied  during the procedure to ensure patient comfort. Blood pressure was monitored every 15 minutes. The patient tolerated the procedure well. Post-procedure instructions were given and follow-up has been scheduled.

## 2021-11-13 ENCOUNTER — Encounter (HOSPITAL_COMMUNITY): Payer: Self-pay | Admitting: Psychiatry

## 2021-11-13 ENCOUNTER — Telehealth (HOSPITAL_BASED_OUTPATIENT_CLINIC_OR_DEPARTMENT_OTHER): Payer: 59 | Admitting: Psychiatry

## 2021-11-13 ENCOUNTER — Other Ambulatory Visit: Payer: Self-pay

## 2021-11-13 DIAGNOSIS — F331 Major depressive disorder, recurrent, moderate: Secondary | ICD-10-CM

## 2021-11-13 DIAGNOSIS — F4310 Post-traumatic stress disorder, unspecified: Secondary | ICD-10-CM

## 2021-11-13 DIAGNOSIS — F410 Panic disorder [episodic paroxysmal anxiety] without agoraphobia: Secondary | ICD-10-CM

## 2021-11-13 MED ORDER — CLONAZEPAM 0.5 MG PO TABS: 0.5000 mg | ORAL_TABLET | Freq: Two times a day (BID) | ORAL | 2 refills | Status: DC

## 2021-11-13 MED ORDER — CHLORPROMAZINE HCL 100 MG PO TABS: ORAL_TABLET | ORAL | 2 refills | Status: DC

## 2021-11-13 MED ORDER — FLUOXETINE HCL 60 MG PO TABS: 60.0000 mg | ORAL_TABLET | Freq: Every day | ORAL | 0 refills | Status: DC

## 2021-11-13 MED ORDER — LAMOTRIGINE 200 MG PO TABS: 200.0000 mg | ORAL_TABLET | Freq: Every day | ORAL | 0 refills | Status: DC

## 2021-11-13 MED ORDER — BENZTROPINE MESYLATE 0.5 MG PO TABS: 0.5000 mg | ORAL_TABLET | Freq: Every day | ORAL | 0 refills | Status: DC
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
## 2021-11-14 ENCOUNTER — Other Ambulatory Visit (HOSPITAL_COMMUNITY): Payer: Self-pay | Admitting: *Deleted

## 2021-11-14 ENCOUNTER — Telehealth (HOSPITAL_COMMUNITY): Payer: Self-pay | Admitting: *Deleted

## 2021-11-14 MED ORDER — ESCITALOPRAM OXALATE 20 MG PO TABS: 20.0000 mg | ORAL_TABLET | Freq: Every day | ORAL | 1 refills | Status: DC

## 2021-11-14 NOTE — Telephone Encounter (Signed)
Can you call patient and inform about Prozac and Plavix interaction and if she agreed to switch from Prozac to Lexapro then please call Lexapro 20 mg to her pharmacy.  Please also check with pharmacy if Lexapro is okay with Plavix.

## 2021-11-14 NOTE — Telephone Encounter (Signed)
Pt agreeable and verbalizes understanding.

## 2021-11-14 NOTE — Telephone Encounter (Signed)
PhD Helyn App called from pt pharmacy, [facility] [phone], to advise that there is a drug interaction between the Prozac and Plavix. Indicating that he may reduce effectiveness of the Plavix thus putting pt at increased risk for another CVA. Please review and advise.

## 2021-11-20 ENCOUNTER — Ambulatory Visit: Payer: Managed Care, Other (non HMO) | Admitting: Endocrinology

## 2021-11-21 ENCOUNTER — Emergency Department: Payer: Managed Care, Other (non HMO)

## 2021-11-21 ENCOUNTER — Encounter: Payer: Self-pay | Admitting: *Deleted

## 2021-11-21 ENCOUNTER — Other Ambulatory Visit: Payer: Self-pay

## 2021-11-21 ENCOUNTER — Inpatient Hospital Stay
Admission: EM | Admit: 2021-11-21 | Discharge: 2021-11-27 | DRG: 871 | Disposition: A | Discharge status: Rehabilitation Facility | Payer: Managed Care, Other (non HMO) | Attending: Internal Medicine | Admitting: Internal Medicine

## 2021-11-21 ENCOUNTER — Inpatient Hospital Stay: Payer: Managed Care, Other (non HMO)

## 2021-11-21 DIAGNOSIS — F32A Depression, unspecified: Secondary | ICD-10-CM | POA: Diagnosis not present

## 2021-11-21 DIAGNOSIS — Z813 Family history of other psychoactive substance abuse and dependence: Secondary | ICD-10-CM | POA: Diagnosis not present

## 2021-11-21 DIAGNOSIS — Z79899 Other long term (current) drug therapy: Secondary | ICD-10-CM

## 2021-11-21 DIAGNOSIS — F330 Major depressive disorder, recurrent, mild: Secondary | ICD-10-CM | POA: Diagnosis present

## 2021-11-21 DIAGNOSIS — Z751 Person awaiting admission to adequate facility elsewhere: Secondary | ICD-10-CM | POA: Diagnosis not present

## 2021-11-21 DIAGNOSIS — Z888 Allergy status to other drugs, medicaments and biological substances status: Secondary | ICD-10-CM

## 2021-11-21 DIAGNOSIS — G4700 Insomnia, unspecified: Secondary | ICD-10-CM | POA: Diagnosis not present

## 2021-11-21 DIAGNOSIS — Z7985 Long-term (current) use of injectable non-insulin antidiabetic drugs: Secondary | ICD-10-CM

## 2021-11-21 DIAGNOSIS — W109XXA Fall (on) (from) unspecified stairs and steps, initial encounter: Secondary | ICD-10-CM | POA: Diagnosis present

## 2021-11-21 DIAGNOSIS — I2510 Atherosclerotic heart disease of native coronary artery without angina pectoris: Secondary | ICD-10-CM | POA: Diagnosis present

## 2021-11-21 DIAGNOSIS — Z823 Family history of stroke: Secondary | ICD-10-CM

## 2021-11-21 DIAGNOSIS — S82892A Other fracture of left lower leg, initial encounter for closed fracture: Secondary | ICD-10-CM | POA: Diagnosis not present

## 2021-11-21 DIAGNOSIS — I252 Old myocardial infarction: Secondary | ICD-10-CM

## 2021-11-21 DIAGNOSIS — Y92012 Bathroom of single-family (private) house as the place of occurrence of the external cause: Secondary | ICD-10-CM | POA: Diagnosis not present

## 2021-11-21 DIAGNOSIS — J189 Pneumonia, unspecified organism: Secondary | ICD-10-CM | POA: Diagnosis present

## 2021-11-21 DIAGNOSIS — E871 Hypo-osmolality and hyponatremia: Secondary | ICD-10-CM | POA: Diagnosis present

## 2021-11-21 DIAGNOSIS — I5032 Chronic diastolic (congestive) heart failure: Secondary | ICD-10-CM | POA: Diagnosis present

## 2021-11-21 DIAGNOSIS — J9601 Acute respiratory failure with hypoxia: Secondary | ICD-10-CM | POA: Diagnosis present

## 2021-11-21 DIAGNOSIS — E1165 Type 2 diabetes mellitus with hyperglycemia: Secondary | ICD-10-CM | POA: Diagnosis present

## 2021-11-21 DIAGNOSIS — Z20822 Contact with and (suspected) exposure to covid-19: Secondary | ICD-10-CM | POA: Diagnosis present

## 2021-11-21 DIAGNOSIS — Z8249 Family history of ischemic heart disease and other diseases of the circulatory system: Secondary | ICD-10-CM | POA: Diagnosis not present

## 2021-11-21 DIAGNOSIS — K760 Fatty (change of) liver, not elsewhere classified: Secondary | ICD-10-CM | POA: Diagnosis present

## 2021-11-21 DIAGNOSIS — I69334 Monoplegia of upper limb following cerebral infarction affecting left non-dominant side: Secondary | ICD-10-CM

## 2021-11-21 DIAGNOSIS — S8252XA Displaced fracture of medial malleolus of left tibia, initial encounter for closed fracture: Secondary | ICD-10-CM | POA: Diagnosis not present

## 2021-11-21 DIAGNOSIS — E1151 Type 2 diabetes mellitus with diabetic peripheral angiopathy without gangrene: Secondary | ICD-10-CM | POA: Diagnosis present

## 2021-11-21 DIAGNOSIS — A419 Sepsis, unspecified organism: Secondary | ICD-10-CM | POA: Diagnosis present

## 2021-11-21 DIAGNOSIS — W1811XA Fall from or off toilet without subsequent striking against object, initial encounter: Secondary | ICD-10-CM | POA: Diagnosis present

## 2021-11-21 DIAGNOSIS — Z818 Family history of other mental and behavioral disorders: Secondary | ICD-10-CM | POA: Diagnosis not present

## 2021-11-21 DIAGNOSIS — I110 Hypertensive heart disease with heart failure: Secondary | ICD-10-CM | POA: Diagnosis present

## 2021-11-21 DIAGNOSIS — R5381 Other malaise: Secondary | ICD-10-CM | POA: Diagnosis not present

## 2021-11-21 DIAGNOSIS — G9341 Metabolic encephalopathy: Secondary | ICD-10-CM | POA: Diagnosis present

## 2021-11-21 DIAGNOSIS — Z7982 Long term (current) use of aspirin: Secondary | ICD-10-CM

## 2021-11-21 DIAGNOSIS — F4310 Post-traumatic stress disorder, unspecified: Secondary | ICD-10-CM | POA: Diagnosis present

## 2021-11-21 DIAGNOSIS — R6520 Severe sepsis without septic shock: Secondary | ICD-10-CM

## 2021-11-21 DIAGNOSIS — Z87891 Personal history of nicotine dependence: Secondary | ICD-10-CM

## 2021-11-21 DIAGNOSIS — K219 Gastro-esophageal reflux disease without esophagitis: Secondary | ICD-10-CM | POA: Diagnosis present

## 2021-11-21 DIAGNOSIS — Z811 Family history of alcohol abuse and dependence: Secondary | ICD-10-CM

## 2021-11-21 DIAGNOSIS — Z951 Presence of aortocoronary bypass graft: Secondary | ICD-10-CM

## 2021-11-21 DIAGNOSIS — E1169 Type 2 diabetes mellitus with other specified complication: Secondary | ICD-10-CM

## 2021-11-21 DIAGNOSIS — Z794 Long term (current) use of insulin: Secondary | ICD-10-CM

## 2021-11-21 DIAGNOSIS — S82862A Displaced Maisonneuve's fracture of left leg, initial encounter for closed fracture: Secondary | ICD-10-CM | POA: Diagnosis present

## 2021-11-21 DIAGNOSIS — E785 Hyperlipidemia, unspecified: Secondary | ICD-10-CM | POA: Diagnosis present

## 2021-11-21 DIAGNOSIS — Z833 Family history of diabetes mellitus: Secondary | ICD-10-CM

## 2021-11-21 DIAGNOSIS — Z7902 Long term (current) use of antithrombotics/antiplatelets: Secondary | ICD-10-CM

## 2021-11-21 LAB — CBC
HCT: 40.1 % (ref 36.0–46.0)
Hemoglobin: 13.4 g/dL (ref 12.0–15.0)
MCH: 28.2 pg (ref 26.0–34.0)
MCHC: 33.4 g/dL (ref 30.0–36.0)
MCV: 84.2 fL (ref 80.0–100.0)
Platelets: 328 10*3/uL (ref 150–400)
RBC: 4.76 MIL/uL (ref 3.87–5.11)
RDW: 13.5 % (ref 11.5–15.5)
WBC: 19.8 10*3/uL — ABNORMAL HIGH (ref 4.0–10.5)
nRBC: 0 % (ref 0.0–0.2)

## 2021-11-21 LAB — URINALYSIS, ROUTINE W REFLEX MICROSCOPIC
Bilirubin Urine: NEGATIVE
Glucose, UA: NEGATIVE mg/dL
Ketones, ur: NEGATIVE mg/dL
Nitrite: NEGATIVE
Protein, ur: NEGATIVE mg/dL
Specific Gravity, Urine: 1.015 (ref 1.005–1.030)
pH: 7 (ref 5.0–8.0)

## 2021-11-21 LAB — MRSA NEXT GEN BY PCR, NASAL: MRSA by PCR Next Gen: NOT DETECTED

## 2021-11-21 LAB — BASIC METABOLIC PANEL
Anion gap: 10 (ref 5–15)
BUN: 16 mg/dL (ref 6–20)
CO2: 25 mmol/L (ref 22–32)
Calcium: 9.5 mg/dL (ref 8.9–10.3)
Chloride: 99 mmol/L (ref 98–111)
Creatinine, Ser: 0.83 mg/dL (ref 0.44–1.00)
GFR, Estimated: >60 mL/min (ref >60)
Glucose, Bld: 128 mg/dL — ABNORMAL HIGH (ref 70–99)
Potassium: 3.7 mmol/L (ref 3.5–5.1)
Sodium: 134 mmol/L — ABNORMAL LOW (ref 135–145)

## 2021-11-21 LAB — CBG MONITORING, ED: Glucose-Capillary: 98 mg/dL (ref 70–99)

## 2021-11-21 LAB — PROTIME-INR
INR: 1.1 (ref 0.8–1.2)
Prothrombin Time: 13.9 seconds (ref 11.4–15.2)

## 2021-11-21 LAB — HIV ANTIBODY (ROUTINE TESTING W REFLEX): HIV Screen 4th Generation wRfx: NONREACTIVE

## 2021-11-21 LAB — STREP PNEUMONIAE URINARY ANTIGEN: Strep Pneumo Urinary Antigen: NEGATIVE

## 2021-11-21 LAB — RESP PANEL BY RT-PCR (FLU A&B, COVID) ARPGX2
Influenza A by PCR: NEGATIVE
Influenza B by PCR: NEGATIVE
SARS Coronavirus 2 by RT PCR: NEGATIVE

## 2021-11-21 LAB — APTT: aPTT: 34 seconds (ref 24–36)

## 2021-11-21 LAB — LACTIC ACID, PLASMA
Lactic Acid, Venous: 2.1 mmol/L (ref 0.5–1.9)
Lactic Acid, Venous: 1.5 mmol/L (ref 0.5–1.9)

## 2021-11-21 MED ORDER — ONDANSETRON HCL 4 MG PO TABS: 4.0000 mg | ORAL_TABLET | Freq: Four times a day (QID) | ORAL | Status: DC | PRN

## 2021-11-21 MED ORDER — GUAIFENESIN ER 600 MG PO TB12
600.0000 mg | ORAL_TABLET | Freq: Two times a day (BID) | ORAL | Status: DC
  Administered 2021-11-21 – 2021-11-27 (×12): 600 mg via ORAL
  Filled 2021-11-21 (×12): qty 1

## 2021-11-21 MED ORDER — ASPIRIN 81 MG PO CHEW
81.0000 mg | CHEWABLE_TABLET | Freq: Every day | ORAL | Status: DC
  Administered 2021-11-22 – 2021-11-27 (×6): 81 mg via ORAL
  Filled 2021-11-21 (×6): qty 1

## 2021-11-21 MED ORDER — LACTATED RINGERS IV SOLN: INTRAVENOUS | Status: DC

## 2021-11-21 MED ORDER — EVOLOCUMAB 140 MG/ML ~~LOC~~ SOAJ: 1.0000 | SUBCUTANEOUS | Status: DC

## 2021-11-21 MED ORDER — ACETAMINOPHEN 325 MG PO TABS: 650.0000 mg | ORAL_TABLET | Freq: Four times a day (QID) | ORAL | Status: DC | PRN

## 2021-11-21 MED ORDER — ESCITALOPRAM OXALATE 20 MG PO TABS
20.0000 mg | ORAL_TABLET | Freq: Every day | ORAL | Status: DC
  Administered 2021-11-22 – 2021-11-27 (×6): 20 mg via ORAL
  Filled 2021-11-21: qty 2
  Filled 2021-11-21 (×5): qty 1

## 2021-11-21 MED ORDER — MORPHINE SULFATE (PF) 2 MG/ML IV SOLN
2.0000 mg | INTRAVENOUS | Status: DC | PRN
  Administered 2021-11-21 – 2021-11-22 (×4): 2 mg via INTRAVENOUS
  Filled 2021-11-21 (×3): qty 1

## 2021-11-21 MED ORDER — CLONAZEPAM 0.5 MG PO TABS
0.5000 mg | ORAL_TABLET | Freq: Two times a day (BID) | ORAL | Status: DC
  Administered 2021-11-21 – 2021-11-27 (×12): 0.5 mg via ORAL
  Filled 2021-11-21 (×12): qty 1

## 2021-11-21 MED ORDER — NITROGLYCERIN 0.4 MG SL SUBL: 0.4000 mg | SUBLINGUAL_TABLET | SUBLINGUAL | Status: DC | PRN

## 2021-11-21 MED ORDER — INSULIN GLARGINE-YFGN 100 UNIT/ML ~~LOC~~ SOPN
54.0000 [IU] | PEN_INJECTOR | Freq: Every morning | SUBCUTANEOUS | Status: DC
  Filled 2021-11-21: qty 3

## 2021-11-21 MED ORDER — ONDANSETRON HCL 4 MG PO TABS: 4.0000 mg | ORAL_TABLET | Freq: Every day | ORAL | Status: DC | PRN

## 2021-11-21 MED ORDER — TORSEMIDE 20 MG PO TABS
10.0000 mg | ORAL_TABLET | Freq: Every day | ORAL | Status: DC
  Administered 2021-11-22 – 2021-11-23 (×2): 10 mg via ORAL
  Filled 2021-11-21 (×2): qty 1

## 2021-11-21 MED ORDER — LAMOTRIGINE 100 MG PO TABS
200.0000 mg | ORAL_TABLET | Freq: Every day | ORAL | Status: DC
  Administered 2021-11-21 – 2021-11-26 (×6): 200 mg via ORAL
  Filled 2021-11-21 (×6): qty 2

## 2021-11-21 MED ORDER — POLYETHYLENE GLYCOL 3350 17 G PO PACK
PACK | ORAL | Status: AC
  Administered 2021-11-22: 17 g
  Filled 2021-11-21: qty 1

## 2021-11-21 MED ORDER — VITAMIN B-12 1000 MCG PO TABS
1000.0000 ug | ORAL_TABLET | Freq: Every day | ORAL | Status: DC
  Administered 2021-11-22 – 2021-11-27 (×6): 1000 ug via ORAL
  Filled 2021-11-21 (×6): qty 1

## 2021-11-21 MED ORDER — ROSUVASTATIN CALCIUM 20 MG PO TABS
20.0000 mg | ORAL_TABLET | Freq: Every day | ORAL | Status: DC
  Administered 2021-11-22 – 2021-11-27 (×6): 20 mg via ORAL
  Filled 2021-11-21 (×6): qty 1

## 2021-11-21 MED ORDER — IPRATROPIUM-ALBUTEROL 0.5-2.5 (3) MG/3ML IN SOLN
3.0000 mL | Freq: Four times a day (QID) | RESPIRATORY_TRACT | Status: DC
  Administered 2021-11-22 – 2021-11-27 (×22): 3 mL via RESPIRATORY_TRACT
  Filled 2021-11-21 (×23): qty 3

## 2021-11-21 MED ORDER — INSULIN ASPART 100 UNIT/ML IJ SOLN
0.0000 [IU] | Freq: Three times a day (TID) | INTRAMUSCULAR | Status: DC
  Administered 2021-11-22: 1 [IU] via SUBCUTANEOUS
  Administered 2021-11-22: 18:00:00 2 [IU] via SUBCUTANEOUS
  Administered 2021-11-22 – 2021-11-23 (×5): 1 [IU] via SUBCUTANEOUS
  Administered 2021-11-24: 2 [IU] via SUBCUTANEOUS
  Administered 2021-11-24 – 2021-11-26 (×4): 1 [IU] via SUBCUTANEOUS
  Filled 2021-11-21 (×9): qty 1

## 2021-11-21 MED ORDER — VITAMIN D (ERGOCALCIFEROL) 1.25 MG (50000 UNIT) PO CAPS
50000.0000 [IU] | ORAL_CAPSULE | ORAL | Status: DC
Start: 2021-11-22 — End: 2021-11-27
  Administered 2021-11-22: 50000 [IU] via ORAL
  Filled 2021-11-21: qty 1

## 2021-11-21 MED ORDER — SODIUM CHLORIDE 0.9 % IV SOLN: INTRAVENOUS | Status: DC

## 2021-11-21 MED ORDER — HYDROCOD POLST-CPM POLST ER 10-8 MG/5ML PO SUER: 5.0000 mL | Freq: Two times a day (BID) | ORAL | Status: DC | PRN

## 2021-11-21 MED ORDER — ACETAMINOPHEN 650 MG RE SUPP: 650.0000 mg | Freq: Four times a day (QID) | RECTAL | Status: DC | PRN

## 2021-11-21 MED ORDER — ENOXAPARIN SODIUM 60 MG/0.6ML IJ SOSY
0.5000 mg/kg | PREFILLED_SYRINGE | INTRAMUSCULAR | Status: DC
  Administered 2021-11-21 – 2021-11-26 (×6): 50 mg via SUBCUTANEOUS
  Filled 2021-11-21 (×4): qty 0.6
  Filled 2021-11-21: qty 0.5
  Filled 2021-11-21: qty 0.6

## 2021-11-21 MED ORDER — SODIUM CHLORIDE 0.9 % IV SOLN
500.0000 mg | INTRAVENOUS | Status: DC
  Filled 2021-11-21: qty 500

## 2021-11-21 MED ORDER — BENZTROPINE MESYLATE 1 MG PO TABS
ORAL_TABLET | ORAL | Status: AC
  Administered 2021-11-21: 0.5 mg via ORAL
  Filled 2021-11-21: qty 1

## 2021-11-21 MED ORDER — CHLORPROMAZINE HCL 100 MG PO TABS
100.0000 mg | ORAL_TABLET | Freq: Every day | ORAL | Status: DC
  Administered 2021-11-21 – 2021-11-26 (×6): 100 mg via ORAL
  Filled 2021-11-21 (×7): qty 1

## 2021-11-21 MED ORDER — CYCLOBENZAPRINE HCL 10 MG PO TABS
10.0000 mg | ORAL_TABLET | Freq: Every day | ORAL | Status: DC
  Administered 2021-11-21 – 2021-11-26 (×6): 10 mg via ORAL
  Filled 2021-11-21 (×6): qty 1

## 2021-11-21 MED ORDER — BENZTROPINE MESYLATE 0.5 MG PO TABS
0.5000 mg | ORAL_TABLET | Freq: Every day | ORAL | Status: DC
  Administered 2021-11-22 – 2021-11-26 (×5): 0.5 mg via ORAL
  Filled 2021-11-21 (×6): qty 1

## 2021-11-21 MED ORDER — LACTATED RINGERS IV BOLUS (SEPSIS)
1000.0000 mL | Freq: Once | INTRAVENOUS | Status: AC
  Administered 2021-11-21: 1000 mL via INTRAVENOUS

## 2021-11-21 MED ORDER — INSULIN GLARGINE-YFGN 100 UNIT/ML ~~LOC~~ SOLN
54.0000 [IU] | Freq: Every day | SUBCUTANEOUS | Status: DC
  Administered 2021-11-23 – 2021-11-27 (×5): 54 [IU] via SUBCUTANEOUS
  Filled 2021-11-21 (×6): qty 0.54

## 2021-11-21 MED ORDER — EZETIMIBE 10 MG PO TABS
10.0000 mg | ORAL_TABLET | Freq: Every day | ORAL | Status: DC
  Administered 2021-11-22 – 2021-11-27 (×6): 10 mg via ORAL
  Filled 2021-11-21 (×6): qty 1

## 2021-11-21 MED ORDER — VANCOMYCIN HCL 1250 MG/250ML IV SOLN
1250.0000 mg | Freq: Once | INTRAVENOUS | Status: AC
  Administered 2021-11-21: 1250 mg via INTRAVENOUS
  Filled 2021-11-21: qty 250

## 2021-11-21 MED ORDER — POTASSIUM CHLORIDE 20 MEQ PO PACK
20.0000 meq | PACK | Freq: Every day | ORAL | Status: DC
  Administered 2021-11-22 – 2021-11-27 (×5): 20 meq via ORAL
  Filled 2021-11-21 (×6): qty 1

## 2021-11-21 MED ORDER — ALBUTEROL SULFATE (2.5 MG/3ML) 0.083% IN NEBU: 2.5000 mg | INHALATION_SOLUTION | Freq: Four times a day (QID) | RESPIRATORY_TRACT | Status: DC | PRN

## 2021-11-21 MED ORDER — SENNOSIDES-DOCUSATE SODIUM 8.6-50 MG PO TABS
2.0000 | ORAL_TABLET | Freq: Two times a day (BID) | ORAL | Status: DC
  Administered 2021-11-22 – 2021-11-27 (×11): 2 via ORAL
  Filled 2021-11-21 (×12): qty 2

## 2021-11-21 MED ORDER — BUTALBITAL-APAP-CAFFEINE 50-325-40 MG PO TABS: 1.0000 | ORAL_TABLET | Freq: Every day | ORAL | Status: DC | PRN

## 2021-11-21 MED ORDER — SODIUM CHLORIDE 0.9 % IV SOLN
2.0000 g | INTRAVENOUS | Status: DC
  Administered 2021-11-21: 2 g via INTRAVENOUS
  Filled 2021-11-21: qty 20

## 2021-11-21 MED ORDER — ISOSORBIDE MONONITRATE ER 30 MG PO TB24
60.0000 mg | ORAL_TABLET | Freq: Two times a day (BID) | ORAL | Status: DC
  Administered 2021-11-21 – 2021-11-27 (×12): 60 mg via ORAL
  Filled 2021-11-21 (×3): qty 2
  Filled 2021-11-21: qty 1
  Filled 2021-11-21: qty 2
  Filled 2021-11-21: qty 1
  Filled 2021-11-21 (×6): qty 2

## 2021-11-21 MED ORDER — SPIRONOLACTONE 25 MG PO TABS
25.0000 mg | ORAL_TABLET | Freq: Every day | ORAL | Status: DC
  Administered 2021-11-22 – 2021-11-27 (×6): 25 mg via ORAL
  Filled 2021-11-21 (×6): qty 1

## 2021-11-21 MED ORDER — PREGABALIN 75 MG PO CAPS
225.0000 mg | ORAL_CAPSULE | Freq: Two times a day (BID) | ORAL | Status: DC
  Administered 2021-11-21 – 2021-11-27 (×12): 225 mg via ORAL
  Filled 2021-11-21 (×7): qty 3
  Filled 2021-11-21: qty 9
  Filled 2021-11-21 (×2): qty 3
  Filled 2021-11-21: qty 9
  Filled 2021-11-21: qty 3

## 2021-11-21 MED ORDER — ONDANSETRON HCL 4 MG/2ML IJ SOLN: 4.0000 mg | Freq: Four times a day (QID) | INTRAMUSCULAR | Status: DC | PRN

## 2021-11-21 MED ORDER — TRAZODONE HCL 50 MG PO TABS
25.0000 mg | ORAL_TABLET | Freq: Every evening | ORAL | Status: DC | PRN
  Administered 2021-11-21 – 2021-11-25 (×2): 25 mg via ORAL
  Filled 2021-11-21 (×2): qty 1

## 2021-11-21 MED ORDER — SODIUM CHLORIDE 0.9 % IV SOLN: 2.0000 g | Freq: Once | INTRAVENOUS | Status: DC

## 2021-11-21 MED ORDER — POLYETHYLENE GLYCOL 3350 17 GM/SCOOP PO POWD
17.0000 g | Freq: Two times a day (BID) | ORAL | Status: DC
  Administered 2021-11-22: 17 g via ORAL
  Filled 2021-11-21 (×2): qty 255

## 2021-11-21 MED ORDER — PANTOPRAZOLE SODIUM 40 MG PO TBEC
40.0000 mg | DELAYED_RELEASE_TABLET | Freq: Every day | ORAL | Status: DC
  Administered 2021-11-22 – 2021-11-27 (×6): 40 mg via ORAL
  Filled 2021-11-21 (×6): qty 1

## 2021-11-21 MED ORDER — SODIUM CHLORIDE 0.9 % IV SOLN: 500.0000 mg | Freq: Once | INTRAVENOUS | Status: DC

## 2021-11-21 MED ORDER — MAGNESIUM HYDROXIDE 400 MG/5ML PO SUSP
30.0000 mL | Freq: Every day | ORAL | Status: DC | PRN
  Filled 2021-11-21: qty 30

## 2021-11-21 MED ORDER — VANCOMYCIN HCL IN DEXTROSE 1-5 GM/200ML-% IV SOLN: 1000.0000 mg | Freq: Once | INTRAVENOUS | Status: DC

## 2021-11-21 MED ORDER — SODIUM CHLORIDE 0.9 % IV SOLN
INTRAVENOUS | Status: AC
  Administered 2021-11-22: 500 mg via INTRAVENOUS
  Filled 2021-11-21: qty 500

## 2021-11-21 MED ORDER — ICOSAPENT ETHYL 1 G PO CAPS
2.0000 g | ORAL_CAPSULE | Freq: Two times a day (BID) | ORAL | Status: DC
  Administered 2021-11-21 – 2021-11-27 (×12): 2 g via ORAL
  Filled 2021-11-21 (×14): qty 2

## 2021-11-21 MED ORDER — CLOPIDOGREL BISULFATE 75 MG PO TABS
75.0000 mg | ORAL_TABLET | Freq: Every day | ORAL | Status: DC
  Administered 2021-11-22 – 2021-11-27 (×6): 75 mg via ORAL
  Filled 2021-11-21 (×6): qty 1

## 2021-11-21 MED ORDER — CYANOCOBALAMIN 1000 MCG/ML IJ SOLN
1000.0000 ug | INTRAMUSCULAR | Status: DC
  Filled 2021-11-21: qty 1

## 2021-11-21 MED ORDER — ALBUTEROL SULFATE HFA 108 (90 BASE) MCG/ACT IN AERS: 2.0000 | INHALATION_SPRAY | Freq: Four times a day (QID) | RESPIRATORY_TRACT | Status: DC | PRN

## 2021-11-21 MED ORDER — CARVEDILOL 6.25 MG PO TABS
6.2500 mg | ORAL_TABLET | Freq: Two times a day (BID) | ORAL | Status: DC
  Administered 2021-11-21 – 2021-11-27 (×12): 6.25 mg via ORAL
  Filled 2021-11-21 (×12): qty 1

## 2021-11-21 NOTE — ED Notes (Signed)
Pt placed on 3L of Oxygen via [place].

## 2021-11-21 NOTE — Consult Note (Addendum)
ORTHOPAEDIC CONSULTATION  REQUESTING PHYSICIAN: Mansy, Arvella Merles, MD  Chief Complaint:   Left ankle pain.  History of Present Illness: Erika [name] is a 50 y.o. female with multiple medical problems including diabetes, coronary artery disease, status post a myocardial infarction, hypertension, hyperlipidemia, depression, PTSD, and multiple strokes.  Apparently the patient has been recuperating from a stroke which occurred about a month ago and recently had returned home from rehab facility.  According to the patient, she was getting up from the toilet in her home last evening when she lost her balance and fell, rolling her left ankle.  She was experiencing a worsening cough over the past week, so she presented to the emergency room where she complained of her left ankle pain.  X-rays of her left ankle in the emergency room demonstrated a small avulsion fracture off the medial malleolus, prompting orthopedic consultation.  Past Medical History:  Diagnosis Date   Arthralgia of temporomandibular joint    CAD, multiple vessel    a. 06/2016 Cath: ostLM 40%, ostLAD 40%, pLAD 95%, ost-pLCx 60%, pLCx 95%, mLCx 60%, mRCA 95%, D2 50%, LVSF nl;  b. 07/2016 CABG x 4 (LIMA->LAD, VG->Diag, VG->OM, VG->RCA); c. 08/2016 Cath: 3VD w/ 4/4 patent grafts. LAD distal to LIMA has diff dzs->Med rx; d. 08/2020 Cath: 4/4 patent grafts, native 3VD. EF 55-65%-->Med Rx.   Carotid arterial disease ([place])    a. 07/2016 s/p R CEA; b. 02/2021 U/S: RICA 09-98%, LICA 3-38%.   Clotting disorder ([place])    Depression    Diastolic dysfunction    a. 06/2016 Echo: EF 50-55%, mild inf wall HK, GR1DD, mild MR, RV sys fxn nl, mildly dilated LA, PASP nl   Fatty liver disease, nonalcoholic 2505   History of blood transfusion    with heart surgery   HLD (hyperlipidemia)    Labile hypertension    a. prior renal ngiogram negative for RAS in 03/2016; b. catecholamines and  metanephrines normal, mildly elevated renin with normal aldosterone and normal ratio in 02/2016   Myocardial infarction [facility]) 2017   Obesity    PAD (peripheral artery disease) ([place])    a. 09/2018 s/p L SFA stenting; b. 07/2019 Periph Angio: Patent m/d L SFA stent w/ 100% L SFA distal to stent. L AT 100d, L Peroneal diff dzs-->Med Rx; c. 02/2021 ABIs: stable @ 0.61 on R and 0.46 on L.   PTSD (post-traumatic stress disorder)    Tobacco abuse    Type 2 diabetes mellitus ([place]) 12/2015   Past Surgical History:  Procedure Laterality Date   ABDOMINAL AORTOGRAM W/LOWER EXTREMITY [name] 10/15/2018   Procedure: ABDOMINAL AORTOGRAM W/LOWER EXTREMITY;  Surgeon: Wellington Hampshire, MD;  Location: [place] [facility];  Service: Cardiovascular;  Laterality: [name];   ABDOMINAL AORTOGRAM W/LOWER EXTREMITY Bilateral 08/19/2019   Procedure: ABDOMINAL AORTOGRAM W/LOWER EXTREMITY;  Surgeon: Wellington Hampshire, MD;  Location: [place] [facility];  Service: Cardiovascular;  Laterality: Bilateral;   CARDIAC CATHETERIZATION [name] 06/29/2016   Procedure: Left Heart Cath and Coronary Angiography;  Surgeon: Minna Merritts, MD;  Location: [place] [facility];  Service: Cardiovascular;  Laterality: [name];   CARDIAC CATHETERIZATION [name] 08/29/2016   Procedure: Left Heart Cath and Cors/Grafts Angiography;  Surgeon: Wellington Hampshire, MD;  Location: [place] [facility];  Service: Cardiovascular;  Laterality: [name];   CESAREAN SECTION     CHOLECYSTECTOMY     CORONARY ARTERY BYPASS GRAFT [name] 07/06/2016   Procedure: CORONARY ARTERY BYPASS GRAFTING (CABG) x four, using  left internal mammary artery and right leg greater saphenous vein harvested endoscopically;  Surgeon: Ivin Poot, MD;  Location: [place];  Service: Open Heart Surgery;  Laterality: [name];   ENDARTERECTOMY Right 07/06/2016   Procedure: ENDARTERECTOMY CAROTID;  Surgeon: Rosetta Posner, MD;  Location: [facility] OR;  Service: Vascular;  Laterality: Right;   ENDARTERECTOMY Right 04/27/2020    Procedure: REDO OF RIGHT ENDARTERECTOMY CAROTID;  Surgeon: Rosetta Posner, MD;  Location: [facility] OR;  Service: Vascular;  Laterality: Right;   LEFT HEART CATH AND CORS/GRAFTS ANGIOGRAPHY [name] 08/24/2020   Procedure: LEFT HEART CATH AND CORS/GRAFTS ANGIOGRAPHY;  Surgeon: Wellington Hampshire, MD;  Location: [place] [facility];  Service: Cardiovascular;  Laterality: [name];   PERIPHERAL VASCULAR CATHETERIZATION [name] 04/18/2016   Procedure: Renal Angiography;  Surgeon: Wellington Hampshire, MD;  Location: [place] [facility];  Service: Cardiovascular;  Laterality: [name];   PERIPHERAL VASCULAR INTERVENTION Left 10/15/2018   Procedure: PERIPHERAL VASCULAR INTERVENTION;  Surgeon: Wellington Hampshire, MD;  Location: [place] [facility];  Service: Cardiovascular;  Laterality: Left;  Left superficial femoral   TEE WITHOUT CARDIOVERSION [name] 07/06/2016   Procedure: TRANSESOPHAGEAL ECHOCARDIOGRAM (TEE);  Surgeon: Ivin Poot, MD;  Location: [place];  Service: Open Heart Surgery;  Laterality: [name];   TONSILLECTOMY     Social History   Socioeconomic History   Marital status: Married    Spouse name: Not on file   Number of children: 1   Years of education: 14   Highest education level: Not on file  Occupational History   Occupation: Disability  Tobacco Use   Smoking status: Former    Packs/day: 0.50    Years: 27.00    Pack years: 13.50    Types: Cigarettes   Smokeless tobacco: Never  Vaping Use   Vaping Use: Every day   Substances: Flavoring  Substance and Sexual Activity   Alcohol use: Not Currently    Alcohol/week: 0.0 standard drinks    Comment: socially   Drug use: No   Sexual activity: Yes    Partners: Male    Birth control/protection: None  Other Topics Concern   Not on file  Social History Narrative   11/21/20   From: [name]   Living: with husband, [name] (2009) and special needs daughter    Work: disability       Family: daughter - [name] (special needs, lives with her) and 2 grown step  children - [name] and [name]       Enjoys: play with dogs - 2 german Shepard, mut, and blue tick walker mix      Exercise: PAD limits exercise   Diet: diabetic diet and low potassium due to daughter      Safety   Seat belts: Yes    Guns: Yes  and secure   Safe in relationships: Yes    Social Determinants of Radio broadcast assistant Strain: Not on file  Food Insecurity: Not on file  Transportation Needs: Not on file  Physical Activity: Not on file  Stress: Not on file  Social Connections: Not on file   Family History  Adopted: Yes  Problem Relation Age of Onset   Diabetes Mother    Diabetes Father    Alcohol abuse Father    Heart disease Father    Drug abuse Father    Stroke Sister    Anxiety disorder Sister    Allergies  Allergen Reactions   Chantix [Varenicline Tartrate] Other (See Comments)  Feels "crazy" and angry    Prior to Admission medications   Medication Sig Start Date End Date Taking? Authorizing Provider  acetaminophen (TYLENOL) 325 MG tablet Take 1-2 tablets (325-650 mg total) by mouth every 4 (four) hours as needed for mild pain. 08/01/21  Yes Love, Ivan Anchors, PA-C  albuterol (PROVENTIL HFA;VENTOLIN HFA) 108 (90 Base) MCG/ACT inhaler Inhale 2 puffs into the lungs every 6 (six) hours as needed for wheezing. 03/02/19 11/21/22 Yes Lucille Passy, MD  aspirin 81 MG chewable tablet Chew 1 tablet (81 mg total) by mouth daily. 06/29/21  Yes Enzo Bi, MD  benztropine (COGENTIN) 0.5 MG tablet Take 1 tablet (0.5 mg total) by mouth at bedtime. 11/13/21 11/13/22 Yes Arfeen, Arlyce Harman, MD  butalbital-acetaminophen-caffeine (FIORICET) [phone] MG tablet Take 1 tablet by mouth daily as needed for headache. 11/10/21 11/10/22 Yes Raulkar, Clide Deutscher, MD  carvedilol (COREG) 6.25 MG tablet Take 1 tablet (6.25 mg total) by mouth 2 (two) times daily. 08/22/21  Yes Wellington Hampshire, MD  chlorproMAZINE (THORAZINE) 100 MG tablet TAKE 1 TABLET BY MOUTH EVERYDAY AT BEDTIME 11/13/21  Yes  Arfeen, Arlyce Harman, MD  clonazePAM (KLONOPIN) 0.5 MG tablet Take 1 tablet (0.5 mg total) by mouth 2 (two) times daily. 11/13/21  Yes Arfeen, Arlyce Harman, MD  clopidogrel (PLAVIX) 75 MG tablet Take 1 tablet (75 mg total) by mouth daily. 09/04/21  Yes Wellington Hampshire, MD  cyanocobalamin (,VITAMIN B-12,) 1000 MCG/ML injection Inject 1 mL (1,000 mcg total) into the skin every 30 (thirty) days. 08/29/21  Yes Love, Ivan Anchors, PA-C  cyclobenzaprine (FLEXERIL) 10 MG tablet Take 1 tablet (10 mg total) by mouth at bedtime. 11/10/21  Yes Raulkar, Clide Deutscher, MD  Dulaglutide (TRULICITY) 2.11 HE/1.7EY SOPN Inject 0.75 mg into the skin once a week. 03/21/21  Yes Renato Shin, MD  escitalopram (LEXAPRO) 20 MG tablet Take 1 tablet (20 mg total) by mouth daily. 11/14/21 11/14/22 Yes Arfeen, Arlyce Harman, MD  Evolocumab (REPATHA SURECLICK) 814 MG/ML SOAJ Inject 1 Dose into the skin every 14 (fourteen) days. 09/07/21  Yes Hilty, Nadean Corwin, MD  ezetimibe (ZETIA) 10 MG tablet Take 1 tablet (10 mg total) by mouth daily. 09/04/21  Yes Wellington Hampshire, MD  guaiFENesin (MUCINEX) 600 MG 12 hr tablet Take 1 tablet (600 mg total) by mouth 2 (two) times daily. 08/01/21  Yes Love, Ivan Anchors, PA-C  icosapent Ethyl (VASCEPA) 1 g capsule Take 2 capsules (2 g total) by mouth 2 (two) times daily. 09/07/21  Yes Hilty, Nadean Corwin, MD  insulin glargine-yfgn (SEMGLEE, YFGN,) 100 UNIT/ML Pen Inject 54 Units into the skin in the morning. And pen needles 1/day Patient taking differently: Inject 27 Units into the skin 2 (two) times daily. And pen needles 1/day 09/04/21  Yes Renato Shin, MD  isosorbide mononitrate (IMDUR) 60 MG 24 hr tablet TAKE ONE TABLET BY MOUTH TWICE A DAY 09/25/21  Yes Wellington Hampshire, MD  lamoTRIgine (LAMICTAL) 200 MG tablet Take 1 tablet (200 mg total) by mouth at bedtime. 11/13/21  Yes Arfeen, Arlyce Harman, MD  nitroGLYCERIN (NITROSTAT) 0.4 MG SL tablet Place 1 tablet (0.4 mg total) under the tongue every 5 (five) minutes as needed for chest  pain. 03/15/21  Yes Wellington Hampshire, MD  ondansetron (ZOFRAN) 4 MG tablet Take 1 tablet (4 mg total) by mouth daily as needed for nausea or vomiting. 03/22/21  Yes Lesleigh Noe, MD  pantoprazole (PROTONIX) 40 MG tablet TAKE ONE TABLET BY MOUTH ONE  TIME DAILY AT 12 AT NOON 10/17/21  Yes Raulkar, Clide Deutscher, MD  polyethylene glycol powder (GLYCOLAX/MIRALAX) 17 GM/SCOOP powder MIX 1 CAPFUL IN 8 OUNCES OF FLUID BY MOUTH TWICE A DAY 10/17/21  Yes Raulkar, Clide Deutscher, MD  pregabalin (LYRICA) 225 MG capsule Take 1 capsule (225 mg total) by mouth 2 (two) times daily. 11/10/21  Yes Raulkar, Clide Deutscher, MD  rosuvastatin (CRESTOR) 20 MG tablet Take 1 tablet (20 mg total) by mouth daily. 08/01/21  Yes Love, Ivan Anchors, PA-C  senna-docusate (SENOKOT-S) 8.6-50 MG tablet Take 2 tablets by mouth 2 (two) times daily. 08/01/21  Yes Love, Ivan Anchors, PA-C  spironolactone (ALDACTONE) 25 MG tablet TAKE ONE TABLET BY MOUTH ONE TIME DAILY 08/25/21  Yes Raulkar, Clide Deutscher, MD  vitamin B-12 1000 MCG tablet Take 1 tablet (1,000 mcg total) by mouth daily. 06/29/21  Yes Enzo Bi, MD  Continuous Blood Gluc Sensor (DEXCOM G6 SENSOR) MISC 1 Dose by Does not apply route daily. 08/09/21   Raulkar, Clide Deutscher, MD  Continuous Blood Gluc Transmit (DEXCOM G6 TRANSMITTER) MISC 1 Units by Does not apply route daily. 08/09/21   Raulkar, Clide Deutscher, MD  Incontinence Supply Disposable (DEPEND ADJUSTABLE UNDERWEAR LG) MISC 1 Units by Does not apply route daily. 08/09/21   Raulkar, Clide Deutscher, MD  Insulin Syringe-Needle U-100 29G X 1/2" 0.3 ML MISC 1 each by Does not apply route 2 (two) times daily. 05/20/17   Nita Sells, MD  potassium chloride (KLOR-CON) 20 MEQ packet Take 20 mEq by mouth daily. Patient not taking: Reported on 11/21/2021 08/02/21   Love, Ivan Anchors, PA-C  torsemide (DEMADEX) 10 MG tablet Take 1 tablet (10 mg total) by mouth daily. PLEASE SCHEDULE OFFICE VISIT FOR FURTHER REFILLS. THANK YOU! 10/09/21 11/08/21  Wellington Hampshire, MD   ULTICARE MINI PEN NEEDLES 31G X 6 MM MISC  10/20/21   [provider]  Vitamin D, Ergocalciferol, (DRISDOL) 1.25 MG (50000 UNIT) CAPS capsule Take 1 capsule (50,000 Units total) by mouth every 7 (seven) days. Patient not taking: Reported on 11/21/2021 08/04/21   Bary Leriche, PA-C   DG Ankle Complete Left  Result Date: 11/21/2021 CLINICAL DATA:  LEFT ankle and knee pain EXAM: LEFT ANKLE COMPLETE - 3+ VIEW COMPARISON:  None. FINDINGS: Small avulsion fragment fragments inferior to the medial malleolus. Ankle mortise intact. Talar dome is normal. No joint effusion. IMPRESSION: Age-indeterminate avulsion fracture from the tip of the medial malleolus. Electronically Signed   By: Suzy Bouchard M.D.   On: 11/21/2021 13:21   DG Chest Portable 1 View  Result Date: 11/21/2021 CLINICAL DATA:  Shortness of breath EXAM: PORTABLE CHEST 1 VIEW COMPARISON:  09/08/2021 FINDINGS: Patchy opacities at the left greater than right lung bases. No pleural effusion. Stable cardiomediastinal contours. IMPRESSION: Patchy opacities at the left greater than right lung bases suspicious for pneumonia. Electronically Signed   By: Macy Mis M.D.   On: 11/21/2021 14:46   DG Knee Complete 4 Views Left  Result Date: 11/21/2021 CLINICAL DATA:  Left knee pain after fall. EXAM: LEFT KNEE - COMPLETE 4+ VIEW COMPARISON:  None. FINDINGS: No evidence of fracture, dislocation, or joint effusion. No evidence of arthropathy or other focal bone abnormality. Soft tissues are unremarkable. IMPRESSION: Negative. Electronically Signed   By: Marijo Conception M.D.   On: 11/21/2021 13:20    Positive ROS: All other systems have been reviewed and were otherwise negative with the exception of those mentioned in the HPI and as  above.  Physical Exam: General:  Alert, no acute distress Psychiatric:  Patient is competent for consent with normal mood and affect   Cardiovascular:  No pedal edema Respiratory:  No wheezing, non-labored  breathing GI:  Abdomen is soft and non-tender Skin:  No lesions in the area of chief complaint Neurologic:  Sensation intact distally Lymphatic:  No axillary or cervical lymphadenopathy  Orthopedic Exam:  Orthopedic examination is limited to the left lower leg and foot.  She has moderate swelling over the medial aspect of the ankle extending into the anterior aspect of the ankle.  She has moderate focal tenderness to palpation near the inferior aspect of the medial malleolus, but has no tenderness over the lateral aspect of the ankle, either over the distal fibula nor over the anterior tibiofibular ligament.  She also has no tenderness more proximally along the fibula.  She is able to actively dorsiflex and plantarflex her toes and ankle, although she has discomfort with ankle motion.  She is neurovascularly intact to her left foot.  X-rays:  Recent AP, lateral, and mortise x-rays of the left ankle are available for review and have been reviewed by myself.  These films demonstrate a small avulsion fracture off the tip of the medial malleolus.  The mortise itself appears to be anatomic and there does not appear to be any other acute bony abnormalities.  There are no significant degenerative changes of the ankle joint identified.  AP, lateral, and oblique views of the left knee also are available for review and have been reviewed by myself.  These films demonstrate no evidence for fractures, lytic lesions, or significant degenerative changes.  However, the combination of the ankle and knee films do not adequately visualize the entire fibula, so a Maisonneuve fracture cannot be ruled out.  Assessment: Medial malleolar avulsion fracture, left ankle.  Plan: The treatment options have been discussed with the patient.  I will order left tib-fib films to rule out a nondisplaced fibular fracture occurring in the mid diaphyseal region which is not visualized on either the knee or ankle films already  obtained.  If these films do demonstrate a fibular shaft fracture, that she will require ankle stabilization as treatment of her Maisonneuve fracture.    If these films show no evidence for a fibular fracture, then this injury can be treated as a simple sprain.  In this case, she may be placed into either a cam walker boot or an Aircast splint for comfort and may weight-bear as tolerated on the left leg.  Thank you for asking me to participate in the care of this most unfortunate woman.  I will be happy to follow her with you.   Pascal Lux, MD Beeper #:  [phone]  11/21/2021 6:23 PM   Addendum: The left tib/fib films show no evidence for a fibular fracture.  Therefore, this injury can be treated akin to an ankle sprain with either an AirCast or CAM walker boot for comfort and weight-bearing as tolerated.  Pascal Lux, MD Beeper #:  [phone]  11/21/2021 8:23 PM

## 2021-11-21 NOTE — H&P (Addendum)
[place]   PATIENT NAME: Erika [name]    MR#:  [id number]  DATE OF BIRTH:  1971/02/10  DATE OF ADMISSION:  11/21/2021  PRIMARY CARE PHYSICIAN: Lesleigh Noe, MD   Patient is coming from: Home  REQUESTING/REFERRING PHYSICIAN: Merlyn Lot, MD  CHIEF COMPLAINT:   Chief Complaint  Patient presents with  . Weakness  . Altered Mental Status    HISTORY OF PRESENT ILLNESS:  Erika [name] is a 50 y.o. Caucasian female with medical history significant for coronary artery disease, carotid artery disease, diastolic dysfunction, depression, PAD and PTSD, who presented to the ER with acute onset of worsening cough over the last week and this area got significantly worse since yesterday.  She stated that her cough has been congested and she could not expectorate.  She denied any fever or chills.  Last week, when she went to pain management clinic her pulse oximetry was low per her husband's report and it was decided that it needs to be followed.  No nausea or vomiting greater diarrhea or abdominal pain.  No chest pain or palpitations.  She denies any fever or chills.  She had an accidental mechanical fall yesterday with subsequent left foot pain.  No dysuria, oliguria or hematuria or flank pain.  The patient had altered mental status per her husband.  No headache or dizziness or blurred vision, paresthesias or focal muscle weakness.  Her mental status is significant improved in the ER.  ED Course: When she came to the ER, blood pressure was 143/83 and later 92/56.  Pulse O2 briefly dropped to 85% on room air and came up to 96% on 3 L of O2 by nasal cannula.  Labs revealed mild hyponatremia and significant cytosis 19.8.  Influenza antigens and COVID-19 PCR came back negative.  UA showed 6-10 WBCs with rare bacteria.  She has Klebsiella pneumonia UTI on 09/11/2021 that was sensitive to cephalosporins. EKG as reviewed  by me : , EKG showed normal sinus rhythm with a rate of 79  with Q waves anteroseptally and T wave inversion laterally Imaging: Chest x-ray showed patchy opacities of the left greater than the right lung bases suspicious for pneumonia.  Left ankle x-ray showed age-indeterminate avulsion fracture from the tip of the medial malleolus.  Left knee x-ray was negative.    The patient was given IV cefepime and vancomycin initially.  She will be admitted to a medical telemetry bed for further evaluation and management. PAST MEDICAL HISTORY:   Past Medical History:  Diagnosis Date  . Arthralgia of temporomandibular joint   . CAD, multiple vessel    a. 06/2016 Cath: ostLM 40%, ostLAD 40%, pLAD 95%, ost-pLCx 60%, pLCx 95%, mLCx 60%, mRCA 95%, D2 50%, LVSF nl;  b. 07/2016 CABG x 4 (LIMA->LAD, VG->Diag, VG->OM, VG->RCA); c. 08/2016 Cath: 3VD w/ 4/4 patent grafts. LAD distal to LIMA has diff dzs->Med rx; d. 08/2020 Cath: 4/4 patent grafts, native 3VD. EF 55-65%-->Med Rx.  . Carotid arterial disease ([place])    a. 07/2016 s/p R CEA; b. 02/2021 U/S: RICA 32-99%, LICA 2-42%.  . Clotting disorder ([place])   . Depression   . Diastolic dysfunction    a. 06/2016 Echo: EF 50-55%, mild inf wall HK, GR1DD, mild MR, RV sys fxn nl, mildly dilated LA, PASP nl  . Fatty liver disease, nonalcoholic 6834  . History of blood transfusion    with heart surgery  . HLD (hyperlipidemia)   . Labile hypertension  a. prior renal ngiogram negative for RAS in 03/2016; b. catecholamines and metanephrines normal, mildly elevated renin with normal aldosterone and normal ratio in 02/2016  . Myocardial infarction ([place]) 2017  . Obesity   . PAD (peripheral artery disease) ([place])    a. 09/2018 s/p L SFA stenting; b. 07/2019 Periph Angio: Patent m/d L SFA stent w/ 100% L SFA distal to stent. L AT 100d, L Peroneal diff dzs-->Med Rx; c. 02/2021 ABIs: stable @ 0.61 on R and 0.46 on L.  Marland Kitchen PTSD (post-traumatic stress disorder)   . Tobacco abuse   . Type 2 diabetes mellitus ([place]) 12/2015    PAST SURGICAL HISTORY:    Past Surgical History:  Procedure Laterality Date  . ABDOMINAL AORTOGRAM W/LOWER EXTREMITY [name] 10/15/2018   Procedure: ABDOMINAL AORTOGRAM W/LOWER EXTREMITY;  Surgeon: Wellington Hampshire, MD;  Location: [facility];  Service: Cardiovascular;  Laterality: [name];  . ABDOMINAL AORTOGRAM W/LOWER EXTREMITY Bilateral 08/19/2019   Procedure: ABDOMINAL AORTOGRAM W/LOWER EXTREMITY;  Surgeon: Wellington Hampshire, MD;  Location: [facility];  Service: Cardiovascular;  Laterality: Bilateral;  . CARDIAC CATHETERIZATION [name] 06/29/2016   Procedure: Left Heart Cath and Coronary Angiography;  Surgeon: Minna Merritts, MD;  Location: [facility];  Service: Cardiovascular;  Laterality: [name];  . CARDIAC CATHETERIZATION [name] 08/29/2016   Procedure: Left Heart Cath and Cors/Grafts Angiography;  Surgeon: Wellington Hampshire, MD;  Location: [facility];  Service: Cardiovascular;  Laterality: [name];  . CESAREAN SECTION    . CHOLECYSTECTOMY    . CORONARY ARTERY BYPASS GRAFT [name] 07/06/2016   Procedure: CORONARY ARTERY BYPASS GRAFTING (CABG) x four, using left internal mammary artery and right leg greater saphenous vein harvested endoscopically;  Surgeon: Ivin Poot, MD;  Location: [facility];  Service: Open Heart Surgery;  Laterality: [name];  . ENDARTERECTOMY Right 07/06/2016   Procedure: ENDARTERECTOMY CAROTID;  Surgeon: Rosetta Posner, MD;  Location: [facility];  Service: Vascular;  Laterality: Right;  . ENDARTERECTOMY Right 04/27/2020   Procedure: REDO OF RIGHT ENDARTERECTOMY CAROTID;  Surgeon: Rosetta Posner, MD;  Location: [facility];  Service: Vascular;  Laterality: Right;  . LEFT HEART CATH AND CORS/GRAFTS ANGIOGRAPHY [name] 08/24/2020   Procedure: LEFT HEART CATH AND CORS/GRAFTS ANGIOGRAPHY;  Surgeon: Wellington Hampshire, MD;  Location: [facility];  Service: Cardiovascular;  Laterality: [name];  . PERIPHERAL VASCULAR CATHETERIZATION [name] 04/18/2016   Procedure: Renal Angiography;  Surgeon: Wellington Hampshire, MD;   Location: [facility];  Service: Cardiovascular;  Laterality: [name];  . PERIPHERAL VASCULAR INTERVENTION Left 10/15/2018   Procedure: PERIPHERAL VASCULAR INTERVENTION;  Surgeon: Wellington Hampshire, MD;  Location: [facility];  Service: Cardiovascular;  Laterality: Left;  Left superficial femoral  . TEE WITHOUT CARDIOVERSION [name] 07/06/2016   Procedure: TRANSESOPHAGEAL ECHOCARDIOGRAM (TEE);  Surgeon: Ivin Poot, MD;  Location: [facility];  Service: Open Heart Surgery;  Laterality: [name];  . TONSILLECTOMY      SOCIAL HISTORY:   Social History   Tobacco Use  . Smoking status: Former    Packs/day: 0.50    Years: 27.00    Pack years: 13.50    Types: Cigarettes  . Smokeless tobacco: Never  Substance Use Topics  . Alcohol use: Not Currently    Alcohol/week: 0.0 standard drinks    Comment: socially    FAMILY HISTORY:   Family History  Adopted: Yes  Problem Relation Age of Onset  . Diabetes Mother   .  Diabetes Father   . Alcohol abuse Father   . Heart disease Father   . Drug abuse Father   . Stroke Sister   . Anxiety disorder Sister     DRUG ALLERGIES:   Allergies  Allergen Reactions  . Chantix [Varenicline Tartrate] Other (See Comments)    Feels "crazy" and angry     REVIEW OF SYSTEMS:   ROS As per history of present illness. All pertinent systems were reviewed above. Constitutional, HEENT, cardiovascular, respiratory, GI, GU, musculoskeletal, neuro, psychiatric, endocrine, integumentary and hematologic systems were reviewed and are otherwise negative/unremarkable except for positive findings mentioned above in the HPI.   MEDICATIONS AT HOME:   Prior to Admission medications   Medication Sig Start Date End Date Taking? Authorizing Provider  escitalopram (LEXAPRO) 20 MG tablet Take 1 tablet (20 mg total) by mouth daily. 11/14/21 11/14/22  Arfeen, Arlyce Harman, MD  acetaminophen (TYLENOL) 325 MG tablet Take 1-2 tablets (325-650 mg total) by mouth every 4 (four) hours  as needed for mild pain. 08/01/21   Love, Ivan Anchors, PA-C  albuterol (PROVENTIL HFA;VENTOLIN HFA) 108 (90 Base) MCG/ACT inhaler Inhale 2 puffs into the lungs every 6 (six) hours as needed for wheezing. 03/02/19 11/21/22  Lucille Passy, MD  aspirin 81 MG chewable tablet Chew 1 tablet (81 mg total) by mouth daily. 06/29/21   Enzo Bi, MD  benztropine (COGENTIN) 0.5 MG tablet Take 1 tablet (0.5 mg total) by mouth at bedtime. 11/13/21 11/13/22  Arfeen, Arlyce Harman, MD  butalbital-acetaminophen-caffeine (FIORICET) [id number] MG tablet Take 1 tablet by mouth daily as needed for headache. 11/10/21 11/10/22  Izora Ribas, MD  carvedilol (COREG) 6.25 MG tablet Take 1 tablet (6.25 mg total) by mouth 2 (two) times daily. 08/22/21   Wellington Hampshire, MD  chlorproMAZINE (THORAZINE) 100 MG tablet TAKE 1 TABLET BY MOUTH EVERYDAY AT BEDTIME 11/13/21   Arfeen, Arlyce Harman, MD  clonazePAM (KLONOPIN) 0.5 MG tablet Take 1 tablet (0.5 mg total) by mouth 2 (two) times daily. 11/13/21   Arfeen, Arlyce Harman, MD  clopidogrel (PLAVIX) 75 MG tablet Take 1 tablet (75 mg total) by mouth daily. 09/04/21   Wellington Hampshire, MD  Continuous Blood Gluc Sensor (DEXCOM G6 SENSOR) MISC 1 Dose by Does not apply route daily. 08/09/21   Raulkar, Clide Deutscher, MD  Continuous Blood Gluc Transmit (DEXCOM G6 TRANSMITTER) MISC 1 Units by Does not apply route daily. 08/09/21   Raulkar, Clide Deutscher, MD  cyanocobalamin (,VITAMIN B-12,) 1000 MCG/ML injection Inject 1 mL (1,000 mcg total) into the skin every 30 (thirty) days. 08/29/21   Love, Ivan Anchors, PA-C  cyclobenzaprine (FLEXERIL) 10 MG tablet Take 1 tablet (10 mg total) by mouth at bedtime. 11/10/21   Raulkar, Clide Deutscher, MD  Dulaglutide (TRULICITY) 3.47 QQ/5.9DG SOPN Inject 0.75 mg into the skin once a week. 03/21/21   Renato Shin, MD  Evolocumab (REPATHA SURECLICK) 387 MG/ML SOAJ Inject 1 Dose into the skin every 14 (fourteen) days. 09/07/21   Hilty, Nadean Corwin, MD  ezetimibe (ZETIA) 10 MG tablet Take 1 tablet (10 mg  total) by mouth daily. 09/04/21   Wellington Hampshire, MD  guaiFENesin (MUCINEX) 600 MG 12 hr tablet Take 1 tablet (600 mg total) by mouth 2 (two) times daily. 08/01/21   Love, Ivan Anchors, PA-C  icosapent Ethyl (VASCEPA) 1 g capsule Take 2 capsules (2 g total) by mouth 2 (two) times daily. 09/07/21   Hilty, Nadean Corwin, MD  Incontinence Supply Disposable (DEPEND ADJUSTABLE  UNDERWEAR LG) MISC 1 Units by Does not apply route daily. 08/09/21   Raulkar, Clide Deutscher, MD  insulin glargine-yfgn (SEMGLEE, YFGN,) 100 UNIT/ML Pen Inject 54 Units into the skin in the morning. And pen needles 1/day 09/04/21   Renato Shin, MD  Insulin Syringe-Needle U-100 29G X 1/2" 0.3 ML MISC 1 each by Does not apply route 2 (two) times daily. 05/20/17   Nita Sells, MD  isosorbide mononitrate (IMDUR) 60 MG 24 hr tablet TAKE ONE TABLET BY MOUTH TWICE A DAY 09/25/21   Wellington Hampshire, MD  lamoTRIgine (LAMICTAL) 200 MG tablet Take 1 tablet (200 mg total) by mouth at bedtime. 11/13/21   Arfeen, Arlyce Harman, MD  nitroGLYCERIN (NITROSTAT) 0.4 MG SL tablet Place 1 tablet (0.4 mg total) under the tongue every 5 (five) minutes as needed for chest pain. 03/15/21   Wellington Hampshire, MD  ondansetron (ZOFRAN) 4 MG tablet Take 1 tablet (4 mg total) by mouth daily as needed for nausea or vomiting. 03/22/21   Lesleigh Noe, MD  pantoprazole (PROTONIX) 40 MG tablet TAKE ONE TABLET BY MOUTH ONE TIME DAILY AT 12 AT NOON 10/17/21   Raulkar, Clide Deutscher, MD  polyethylene glycol powder (GLYCOLAX/MIRALAX) 17 GM/SCOOP powder MIX 1 CAPFUL IN 8 OUNCES OF FLUID BY MOUTH TWICE A DAY 10/17/21   Raulkar, Clide Deutscher, MD  potassium chloride (KLOR-CON) 20 MEQ packet Take 20 mEq by mouth daily. 08/02/21   Love, Ivan Anchors, PA-C  pregabalin (LYRICA) 225 MG capsule Take 1 capsule (225 mg total) by mouth 2 (two) times daily. 11/10/21   Raulkar, Clide Deutscher, MD  rosuvastatin (CRESTOR) 20 MG tablet Take 1 tablet (20 mg total) by mouth daily. 08/01/21   Love, Ivan Anchors, PA-C   senna-docusate (SENOKOT-S) 8.6-50 MG tablet Take 2 tablets by mouth 2 (two) times daily. 08/01/21   Love, Ivan Anchors, PA-C  spironolactone (ALDACTONE) 25 MG tablet TAKE ONE TABLET BY MOUTH ONE TIME DAILY 08/25/21   Raulkar, Clide Deutscher, MD  torsemide (DEMADEX) 10 MG tablet Take 1 tablet (10 mg total) by mouth daily. PLEASE SCHEDULE OFFICE VISIT FOR FURTHER REFILLS. THANK YOU! 10/09/21 11/08/21  Wellington Hampshire, MD  ULTICARE MINI PEN NEEDLES 31G X 6 MM MISC  10/20/21   [provider]  vitamin B-12 1000 MCG tablet Take 1 tablet (1,000 mcg total) by mouth daily. 06/29/21   Enzo Bi, MD  Vitamin D, Ergocalciferol, (DRISDOL) 1.25 MG (50000 UNIT) CAPS capsule Take 1 capsule (50,000 Units total) by mouth every 7 (seven) days. 08/04/21   Love, Ivan Anchors, PA-C      VITAL SIGNS:  Blood pressure 124/69, pulse 80, temperature 98.9 F (37.2 C), temperature source Oral, resp. rate 18, height 5\' 7"  (1.702 m), weight 98.9 kg, SpO2 96 %.  PHYSICAL EXAMINATION:  Physical Exam  GENERAL:  50 y.o.-year-old Caucasian female patient lying in the bed with no acute distress.  EYES: Pupils equal, round, reactive to light and accommodation. No scleral icterus. Extraocular muscles intact.  HEENT: Head atraumatic, normocephalic. Oropharynx and nasopharynx clear.  NECK:  Supple, no jugular venous distention. No thyroid enlargement, no tenderness.  LUNGS: Diminished bibasilar breath sounds with bibasal crackles.  No use of accessory muscles of respiration.  CARDIOVASCULAR: Regular rate and rhythm, S1, S2 normal. No murmurs, rubs, or gallops.  ABDOMEN: Soft, nondistended, nontender. Bowel sounds present. No organomegaly or mass.  EXTREMITIES: No pedal edema, cyanosis, or clubbing.  NEUROLOGIC: Cranial nerves II through XII are intact. Muscle strength 5/5  in all extremities. Sensation intact. Gait not checked.  PSYCHIATRIC: The patient is alert and oriented x 3.  She could not tell me the day though.  Normal affect and  good eye contact. Musculoskeletal: Left medial ankle swelling tenderness over her medial malleolus. SKIN: No obvious rash, lesion, or ulcer.   LABORATORY PANEL:   CBC Recent Labs  Lab 11/21/21 1138  WBC 19.8*  HGB 13.4  HCT 40.1  PLT 328   ------------------------------------------------------------------------------------------------------------------  Chemistries  Recent Labs  Lab 11/21/21 1138  NA 134*  K 3.7  CL 99  CO2 25  GLUCOSE 128*  BUN 16  CREATININE 0.83  CALCIUM 9.5   ------------------------------------------------------------------------------------------------------------------  Cardiac Enzymes No results for input(s): TROPONINI in the last 168 hours. ------------------------------------------------------------------------------------------------------------------  RADIOLOGY:  DG Ankle Complete Left  Result Date: 11/21/2021 CLINICAL DATA:  LEFT ankle and knee pain EXAM: LEFT ANKLE COMPLETE - 3+ VIEW COMPARISON:  None. FINDINGS: Small avulsion fragment fragments inferior to the medial malleolus. Ankle mortise intact. Talar dome is normal. No joint effusion. IMPRESSION: Age-indeterminate avulsion fracture from the tip of the medial malleolus. Electronically Signed   By: Suzy Bouchard M.D.   On: 11/21/2021 13:21   DG Chest Portable 1 View  Result Date: 11/21/2021 CLINICAL DATA:  Shortness of breath EXAM: PORTABLE CHEST 1 VIEW COMPARISON:  09/08/2021 FINDINGS: Patchy opacities at the left greater than right lung bases. No pleural effusion. Stable cardiomediastinal contours. IMPRESSION: Patchy opacities at the left greater than right lung bases suspicious for pneumonia. Electronically Signed   By: Macy Mis M.D.   On: 11/21/2021 14:46   DG Knee Complete 4 Views Left  Result Date: 11/21/2021 CLINICAL DATA:  Left knee pain after fall. EXAM: LEFT KNEE - COMPLETE 4+ VIEW COMPARISON:  None. FINDINGS: No evidence of fracture, dislocation, or joint  effusion. No evidence of arthropathy or other focal bone abnormality. Soft tissues are unremarkable. IMPRESSION: Negative. Electronically Signed   By: Marijo Conception M.D.   On: 11/21/2021 13:20      IMPRESSION AND PLAN:  Principal Problem:   CAP (community acquired pneumonia)  1.  Community-acquired pneumonia with subsequent acute hypoxic respiratory failure and mild metabolic encephalopathy. - The patient will be admitted to a medical telemetry bed. - Continue antibiotic therapy with IV Rocephin and Zithromax. - We will follow blood and sputum cultures. - Pneumonia antigens will be obtained. - O2 protocol will be followed. - Mucolytic therapy and bronchodilator therapy will be provided.  2.  Avulsion fracture of the tip of the left gluteal malleolus, age-indeterminate with associated left foot pain status post fall yesterday. - Pain management will be provided. - Orthopedic consult will be obtained. - I notified Dr. Roland Rack about the patient.  3.  Coronary artery disease. - We will continue her Imdur, as needed sublingual nitroglycerin, statin therapy, Plavix and Coreg.  4.  Dyslipidemia. - We will continue statin therapy.  5.  Type 2 diabetes mellitus. - The patient will be placed on supplement coverage with NovoLog and will continue basal coverage.  6.  Depression and PTSD. - We will continue her Lamictal.  7.  Possible UTI. - We will check urine culture. - This should be covered with IV Rocephin.  DVT prophylaxis: Lovenox. Code Status: full code. Family Communication:  The plan of care was discussed in details with the patient (and family). I answered all questions. The patient agreed to proceed with the above mentioned plan. Further management will depend upon hospital course. Disposition Plan:  Back to previous home environment Consults called: Orthopedic consult. All the records are reviewed and case discussed with ED provider.  Status is: Inpatient    Remains  inpatient appropriate because:Ongoing diagnostic testing needed not appropriate for outpatient work up, Unsafe d/c plan, IV treatments appropriate due to intensity of illness or inability to take PO, and Inpatient level of care appropriate due to severity of illness   Dispo: The patient is from: Home              Anticipated d/c is to: Home              Patient currently is not medically stable to d/c.              Difficult to place patient: No  TOTAL TIME TAKING CARE OF THIS PATIENT: 55 minutes.     Christel Mormon M.D on 11/21/2021 at 4:15 PM  [facility]   From 7 PM-7 AM, contact night-coverage [email or web address]  CC: Primary care physician; Lesleigh Noe, MD

## 2021-11-21 NOTE — Consult Note (Signed)
CODE SEPSIS - PHARMACY COMMUNICATION  **Broad Spectrum Antibiotics should be administered within 1 hour of Sepsis diagnosis**  Time Code Sepsis Called/Page Received: 0960  Antibiotics Ordered: 4540  Time of 1st antibiotic administration: 1450  Additional action taken by pharmacy: [name]  If necessary, Name of Provider/Nurse Contacted: [name]    Darnelle Bos ,PharmD Clinical Pharmacist  11/21/2021  2:04 PM

## 2021-11-21 NOTE — Consult Note (Signed)
PHARMACY -  BRIEF ANTIBIOTIC NOTE   Pharmacy has received consult(s) for vancomycin and cefepime from an ED provider.  The patient's profile has been reviewed for ht/wt/allergies/indication/available labs.    One time order(s) placed for cefepime 2 g and vancomycin 2250 mg IV   Further antibiotics/pharmacy consults should be ordered by admitting physician if indicated.                       Thank you, Darnelle Bos 11/21/2021  2:04 PM

## 2021-11-21 NOTE — Sepsis Progress Note (Signed)
Confirmed through secure chat with bedside RN Tiffany that the blood cultures were drawn prior to the antibiotic dosing.

## 2021-11-21 NOTE — ED Triage Notes (Signed)
Pt to ED reporting increased left sided weakness for the past 24 hours with increased confusion per husband. He reports pt has been repeating self more frequently and not answering questions appropriately. Pt also reporting a fall on Monday with left ankle pain since that has kept her from being able to bear weight on the left side.   Pt has a hx of stoke in June of 2022 with left sided deficits from that they have just worsened per husband.   Pt also reporting concerns for BP stating it is usually "very elevated."

## 2021-11-21 NOTE — ED Provider Notes (Signed)
[facility] Emergency Department Provider Note    Event Date/Time   First MD Initiated Contact with Patient 11/21/21 1328     (approximate)  I have reviewed the triage vital signs and the nursing notes.   HISTORY  Chief Complaint Weakness and Altered Mental Status    HPI Erika [name] is a 50 y.o. female with the below listed past medical history presents to the ER for few days of altered mental status increasing fatigue cough congestion and chills.  Has also had some dysuria.  No recent antibiotics.  Has had aspiration events in the past.  Also complaining of left ankle pain that occurred after fall she is feeling weak earlier this week.  She did not hit her head.  Past Medical History:  Diagnosis Date   Arthralgia of temporomandibular joint    CAD, multiple vessel    a. 06/2016 Cath: ostLM 40%, ostLAD 40%, pLAD 95%, ost-pLCx 60%, pLCx 95%, mLCx 60%, mRCA 95%, D2 50%, LVSF nl;  b. 07/2016 CABG x 4 (LIMA->LAD, VG->Diag, VG->OM, VG->RCA); c. 08/2016 Cath: 3VD w/ 4/4 patent grafts. LAD distal to LIMA has diff dzs->Med rx; d. 08/2020 Cath: 4/4 patent grafts, native 3VD. EF 55-65%-->Med Rx.   Carotid arterial disease ([place])    a. 07/2016 s/p R CEA; b. 02/2021 U/S: RICA 86-57%, LICA 8-46%.   Clotting disorder ([place])    Depression    Diastolic dysfunction    a. 06/2016 Echo: EF 50-55%, mild inf wall HK, GR1DD, mild MR, RV sys fxn nl, mildly dilated LA, PASP nl   Fatty liver disease, nonalcoholic 9629   History of blood transfusion    with heart surgery   HLD (hyperlipidemia)    Labile hypertension    a. prior renal ngiogram negative for RAS in 03/2016; b. catecholamines and metanephrines normal, mildly elevated renin with normal aldosterone and normal ratio in 02/2016   Myocardial infarction [facility]) 2017   Obesity    PAD (peripheral artery disease) ([place])    a. 09/2018 s/p L SFA stenting; b. 07/2019 Periph Angio: Patent m/d L SFA stent w/ 100% L SFA distal to stent.  L AT 100d, L Peroneal diff dzs-->Med Rx; c. 02/2021 ABIs: stable @ 0.61 on R and 0.46 on L.   PTSD (post-traumatic stress disorder)    Tobacco abuse    Type 2 diabetes mellitus ([place]) 12/2015   Family History  Adopted: Yes  Problem Relation Age of Onset   Diabetes Mother    Diabetes Father    Alcohol abuse Father    Heart disease Father    Drug abuse Father    Stroke Sister    Anxiety disorder Sister    Past Surgical History:  Procedure Laterality Date   ABDOMINAL AORTOGRAM W/LOWER EXTREMITY [name] 10/15/2018   Procedure: ABDOMINAL AORTOGRAM W/LOWER EXTREMITY;  Surgeon: Wellington Hampshire, MD;  Location: [place] [facility];  Service: Cardiovascular;  Laterality: [name];   ABDOMINAL AORTOGRAM W/LOWER EXTREMITY Bilateral 08/19/2019   Procedure: ABDOMINAL AORTOGRAM W/LOWER EXTREMITY;  Surgeon: Wellington Hampshire, MD;  Location: [place] [facility];  Service: Cardiovascular;  Laterality: Bilateral;   CARDIAC CATHETERIZATION [name] 06/29/2016   Procedure: Left Heart Cath and Coronary Angiography;  Surgeon: Minna Merritts, MD;  Location: [place] [facility];  Service: Cardiovascular;  Laterality: [name];   CARDIAC CATHETERIZATION [name] 08/29/2016   Procedure: Left Heart Cath and Cors/Grafts Angiography;  Surgeon: Wellington Hampshire, MD;  Location: [place] [facility];  Service: Cardiovascular;  Laterality: [name];   CESAREAN SECTION     CHOLECYSTECTOMY     CORONARY ARTERY BYPASS GRAFT [name] 07/06/2016   Procedure: CORONARY ARTERY BYPASS GRAFTING (CABG) x four, using left internal mammary artery and right leg greater saphenous vein harvested endoscopically;  Surgeon: Ivin Poot, MD;  Location: [place];  Service: Open Heart Surgery;  Laterality: [name];   ENDARTERECTOMY Right 07/06/2016   Procedure: ENDARTERECTOMY CAROTID;  Surgeon: Rosetta Posner, MD;  Location: [facility] OR;  Service: Vascular;  Laterality: Right;   ENDARTERECTOMY Right 04/27/2020   Procedure: REDO OF RIGHT ENDARTERECTOMY CAROTID;  Surgeon: Rosetta Posner, MD;   Location: [facility] OR;  Service: Vascular;  Laterality: Right;   LEFT HEART CATH AND CORS/GRAFTS ANGIOGRAPHY [name] 08/24/2020   Procedure: LEFT HEART CATH AND CORS/GRAFTS ANGIOGRAPHY;  Surgeon: Wellington Hampshire, MD;  Location: [place] [facility];  Service: Cardiovascular;  Laterality: [name];   PERIPHERAL VASCULAR CATHETERIZATION [name] 04/18/2016   Procedure: Renal Angiography;  Surgeon: Wellington Hampshire, MD;  Location: [place] [facility];  Service: Cardiovascular;  Laterality: [name];   PERIPHERAL VASCULAR INTERVENTION Left 10/15/2018   Procedure: PERIPHERAL VASCULAR INTERVENTION;  Surgeon: Wellington Hampshire, MD;  Location: [place] [facility];  Service: Cardiovascular;  Laterality: Left;  Left superficial femoral   TEE WITHOUT CARDIOVERSION [name] 07/06/2016   Procedure: TRANSESOPHAGEAL ECHOCARDIOGRAM (TEE);  Surgeon: Ivin Poot, MD;  Location: [place];  Service: Open Heart Surgery;  Laterality: [name];   TONSILLECTOMY     Patient Active Problem List   Diagnosis Date Noted   Cerebrovascular accident (CVA) due to occlusion of vertebral artery ([place]) 09/11/2021   Cognitive change 09/11/2021   Cognitive communication deficit 08/10/2021   Urinary incontinence 08/10/2021   GERD (gastroesophageal reflux disease) 08/03/2021   Chronic post-traumatic stress disorder (PTSD)    Depression with anxiety    Right pontine stroke ([place]) 06/28/2021   Hemiplegia and hemiparesis following cerebral infarction affecting left non-dominant side ([place]) 06/24/2021   Chronic diastolic CHF (congestive heart failure) ([place]) 06/24/2021   Fall 06/24/2021   Abnormal LFTs 06/24/2021   Olecranon bursitis of left elbow 06/12/2021   Chronic hip pain (Bilateral) 06/09/2021   Chronic use of opiate for therapeutic purpose 05/09/2021   Greater trochanteric bursitis of hip (Left) 03/09/2021   Bursitis of hip (Left) 67/67/2094   Uncomplicated opioid dependence ([place]) 02/20/2021   Acute conjunctivitis of left eye 01/02/2021   Unintentional weight loss  11/21/2020   Broken teeth (Right) 08/02/2020   History of MI (myocardial infarction) (July 2017) 08/02/2020   Neuropathy 06/09/2020   Dental abscess 06/09/2020   Foot drop (Left) 06/09/2020   Carotid stenosis, asymptomatic, right 04/27/2020   Chronic migraine 11/03/2019   Thrombocytosis 09/16/2019   Erythrocytosis 09/16/2019   Hypercalcemia 09/06/2019   Leukocytosis 09/06/2019   Polycythemia 09/06/2019   Chronic hip pain (Left) 08/27/2019   Osteoarthritis of hip (Left) 08/27/2019   Gluteal tendonitis of buttock (Left) 08/27/2019   Radial nerve palsy (Right) 07/15/2019   Neuropathy of radial nerve (Right) 06/30/2019   Abnormal bruising 06/25/2019   History of carotid endarterectomy (Right) 03/04/2019   Pain medication agreement signed 03/04/2019   Atypical facial pain (Right) 01/27/2019   Chronic ear pain (Right) 01/27/2019   Chronic jaw pain (Right) 01/27/2019   Geniculate Neuralgia (Right) 01/27/2019   Vitamin D deficiency 01/19/2019   Neurogenic pain 01/19/2019   Chronic anticoagulation (PLAVIX) 01/19/2019   Chronic pain syndrome 01/07/2019   Long term current use of opiate analgesic 01/07/2019  Long term prescription benzodiazepine use 01/07/2019   Pharmacologic therapy 01/07/2019   Disorder of skeletal system 01/07/2019   Problems influencing health status 01/07/2019   Chronic headaches ([place] area of Pain) (Right) 01/07/2019   Opiate use 09/24/2018   PVD (peripheral vascular disease) ([place]) 06/24/2018   Chronic ankle pain (Bilateral) 12/27/2017   Tendinopathy of gluteus medius (Right) 12/27/2017   Tendinopathy of gluteus medius (Left) 12/27/2017   Bilateral hip pain 12/26/2017   Chronic elbow pain (Left) 10/17/2017   Elevated troponin I level 05/21/2017   Major depressive disorder, recurrent episode, moderate ([place]) 11/19/2016   Insomnia 10/30/2016   Constipation 07/25/2016   S/P CABG x 4 07/06/2016   Bradycardia    CAD (coronary artery disease)    Carotid stenosis     CAD in native artery 06/29/2016   Elevated troponin 06/28/2016   Essential hypertension, malignant 06/28/2016   Mild tobacco abuse in early remission 06/28/2016   Essential hypertension    Malignant hypertension    Type 2 diabetes mellitus with other specified complication (HCC)    Chest pain with high risk for cardiac etiology 06/27/2016   NSTEMI (non-ST elevated myocardial infarction) ([place]) 06/27/2016   Proteinuria 03/15/2016   Renal artery stenosis ([place]) 03/15/2016   MDD (major depressive disorder) 10/17/2015   Agoraphobia with panic attacks 04/25/2015   HTN (hypertension), malignant 10/20/2013   Cluster headache 03/20/2012   HLD (hyperlipidemia) 07/26/2010   ADJUSTMENT DISORDER WITH MIXED FEATURES 07/26/2010      Prior to Admission medications   Medication Sig Start Date End Date Taking? Authorizing Provider  escitalopram (LEXAPRO) 20 MG tablet Take 1 tablet (20 mg total) by mouth daily. 11/14/21 11/14/22  Arfeen, Arlyce Harman, MD  acetaminophen (TYLENOL) 325 MG tablet Take 1-2 tablets (325-650 mg total) by mouth every 4 (four) hours as needed for mild pain. 08/01/21   Love, Ivan Anchors, PA-C  albuterol (PROVENTIL HFA;VENTOLIN HFA) 108 (90 Base) MCG/ACT inhaler Inhale 2 puffs into the lungs every 6 (six) hours as needed for wheezing. 03/02/19 11/21/22  Lucille Passy, MD  aspirin 81 MG chewable tablet Chew 1 tablet (81 mg total) by mouth daily. 06/29/21   Enzo Bi, MD  benztropine (COGENTIN) 0.5 MG tablet Take 1 tablet (0.5 mg total) by mouth at bedtime. 11/13/21 11/13/22  Arfeen, Arlyce Harman, MD  butalbital-acetaminophen-caffeine (FIORICET) [phone] MG tablet Take 1 tablet by mouth daily as needed for headache. 11/10/21 11/10/22  Izora Ribas, MD  carvedilol (COREG) 6.25 MG tablet Take 1 tablet (6.25 mg total) by mouth 2 (two) times daily. 08/22/21   Wellington Hampshire, MD  chlorproMAZINE (THORAZINE) 100 MG tablet TAKE 1 TABLET BY MOUTH EVERYDAY AT BEDTIME 11/13/21   Arfeen, Arlyce Harman, MD   clonazePAM (KLONOPIN) 0.5 MG tablet Take 1 tablet (0.5 mg total) by mouth 2 (two) times daily. 11/13/21   Arfeen, Arlyce Harman, MD  clopidogrel (PLAVIX) 75 MG tablet Take 1 tablet (75 mg total) by mouth daily. 09/04/21   Wellington Hampshire, MD  Continuous Blood Gluc Sensor (DEXCOM G6 SENSOR) MISC 1 Dose by Does not apply route daily. 08/09/21   Raulkar, Clide Deutscher, MD  Continuous Blood Gluc Transmit (DEXCOM G6 TRANSMITTER) MISC 1 Units by Does not apply route daily. 08/09/21   Raulkar, Clide Deutscher, MD  cyanocobalamin (,VITAMIN B-12,) 1000 MCG/ML injection Inject 1 mL (1,000 mcg total) into the skin every 30 (thirty) days. 08/29/21   Love, Ivan Anchors, PA-C  cyclobenzaprine (FLEXERIL) 10 MG tablet Take 1 tablet (10  mg total) by mouth at bedtime. 11/10/21   Raulkar, Clide Deutscher, MD  Dulaglutide (TRULICITY) 2.58 NI/7.7OE SOPN Inject 0.75 mg into the skin once a week. 03/21/21   Renato Shin, MD  Evolocumab (REPATHA SURECLICK) 423 MG/ML SOAJ Inject 1 Dose into the skin every 14 (fourteen) days. 09/07/21   Hilty, Nadean Corwin, MD  ezetimibe (ZETIA) 10 MG tablet Take 1 tablet (10 mg total) by mouth daily. 09/04/21   Wellington Hampshire, MD  guaiFENesin (MUCINEX) 600 MG 12 hr tablet Take 1 tablet (600 mg total) by mouth 2 (two) times daily. 08/01/21   Love, Ivan Anchors, PA-C  icosapent Ethyl (VASCEPA) 1 g capsule Take 2 capsules (2 g total) by mouth 2 (two) times daily. 09/07/21   Hilty, Nadean Corwin, MD  Incontinence Supply Disposable (DEPEND ADJUSTABLE UNDERWEAR LG) MISC 1 Units by Does not apply route daily. 08/09/21   Raulkar, Clide Deutscher, MD  insulin glargine-yfgn (SEMGLEE, YFGN,) 100 UNIT/ML Pen Inject 54 Units into the skin in the morning. And pen needles 1/day 09/04/21   Renato Shin, MD  Insulin Syringe-Needle U-100 29G X 1/2" 0.3 ML MISC 1 each by Does not apply route 2 (two) times daily. 05/20/17   Nita Sells, MD  isosorbide mononitrate (IMDUR) 60 MG 24 hr tablet TAKE ONE TABLET BY MOUTH TWICE A DAY 09/25/21   Wellington Hampshire, MD  lamoTRIgine (LAMICTAL) 200 MG tablet Take 1 tablet (200 mg total) by mouth at bedtime. 11/13/21   Arfeen, Arlyce Harman, MD  nitroGLYCERIN (NITROSTAT) 0.4 MG SL tablet Place 1 tablet (0.4 mg total) under the tongue every 5 (five) minutes as needed for chest pain. 03/15/21   Wellington Hampshire, MD  ondansetron (ZOFRAN) 4 MG tablet Take 1 tablet (4 mg total) by mouth daily as needed for nausea or vomiting. 03/22/21   Lesleigh Noe, MD  pantoprazole (PROTONIX) 40 MG tablet TAKE ONE TABLET BY MOUTH ONE TIME DAILY AT 12 AT NOON 10/17/21   Raulkar, Clide Deutscher, MD  polyethylene glycol powder (GLYCOLAX/MIRALAX) 17 GM/SCOOP powder MIX 1 CAPFUL IN 8 OUNCES OF FLUID BY MOUTH TWICE A DAY 10/17/21   Raulkar, Clide Deutscher, MD  potassium chloride (KLOR-CON) 20 MEQ packet Take 20 mEq by mouth daily. 08/02/21   Love, Ivan Anchors, PA-C  pregabalin (LYRICA) 225 MG capsule Take 1 capsule (225 mg total) by mouth 2 (two) times daily. 11/10/21   Raulkar, Clide Deutscher, MD  rosuvastatin (CRESTOR) 20 MG tablet Take 1 tablet (20 mg total) by mouth daily. 08/01/21   Love, Ivan Anchors, PA-C  senna-docusate (SENOKOT-S) 8.6-50 MG tablet Take 2 tablets by mouth 2 (two) times daily. 08/01/21   Love, Ivan Anchors, PA-C  spironolactone (ALDACTONE) 25 MG tablet TAKE ONE TABLET BY MOUTH ONE TIME DAILY 08/25/21   Raulkar, Clide Deutscher, MD  torsemide (DEMADEX) 10 MG tablet Take 1 tablet (10 mg total) by mouth daily. PLEASE SCHEDULE OFFICE VISIT FOR FURTHER REFILLS. THANK YOU! 10/09/21 11/08/21  Wellington Hampshire, MD  ULTICARE MINI PEN NEEDLES 31G X 6 MM MISC  10/20/21   [provider]  vitamin B-12 1000 MCG tablet Take 1 tablet (1,000 mcg total) by mouth daily. 06/29/21   Enzo Bi, MD  Vitamin D, Ergocalciferol, (DRISDOL) 1.25 MG (50000 UNIT) CAPS capsule Take 1 capsule (50,000 Units total) by mouth every 7 (seven) days. 08/04/21   Love, Ivan Anchors, PA-C    Allergies Chantix [varenicline tartrate]    Social History Social History   Tobacco Use  Smoking status: Former    Packs/day: 0.50    Years: 27.00    Pack years: 13.50    Types: Cigarettes   Smokeless tobacco: Never  Vaping Use   Vaping Use: Every day   Substances: Flavoring  Substance Use Topics   Alcohol use: Not Currently    Alcohol/week: 0.0 standard drinks    Comment: socially   Drug use: No    Review of Systems Patient denies headaches, rhinorrhea, blurry vision, numbness, shortness of breath, chest pain, edema, cough, abdominal pain, nausea, vomiting, diarrhea, dysuria, fevers, rashes or hallucinations unless otherwise stated above in HPI. ____________________________________________   PHYSICAL EXAM:  VITAL SIGNS: Vitals:   11/21/21 1143 11/21/21 1538  BP:  124/69  Pulse:  80  Resp:  18  Temp:  98.9 F (37.2 C)  SpO2: 93% 96%    Constitutional: Alert, in mild resp distress requiring supplemental o2 Eyes: Conjunctivae are normal.  Head: Atraumatic. Nose: No congestion/rhinnorhea. Mouth/Throat: Mucous membranes are moist.   Neck: No stridor. Painless ROM.  Cardiovascular: Normal rate, regular rhythm. Grossly normal heart sounds.  Good peripheral circulation. Respiratory: Normal respiratory effort.  No retractions. Lungs with coarse bs throughout Gastrointestinal: Soft and nontender. No distention. No abdominal bruits. No CVA tenderness. Genitourinary:  Musculoskeletal: swelling and ttp to left ankle.  No joint effusions. Neurologic:  Normal speech and language. Chronic left sided weakness Skin:  Skin is warm, dry and intact. No rash noted. Psychiatric: Mood and affect are normal. Speech and behavior are normal.  ____________________________________________   LABS (all labs ordered are listed, but only abnormal results are displayed)  Results for orders placed or performed during the hospital encounter of 11/21/21 (from the past 24 hour(s))  Basic metabolic panel     Status: Abnormal   Collection Time: 11/21/21 11:38 AM  Result Value Ref Range    Sodium 134 (L) 135 - 145 mmol/L   Potassium 3.7 3.5 - 5.1 mmol/L   Chloride 99 98 - 111 mmol/L   CO2 25 22 - 32 mmol/L   Glucose, Bld 128 (H) 70 - 99 mg/dL   BUN 16 6 - 20 mg/dL   Creatinine, Ser 0.83 0.44 - 1.00 mg/dL   Calcium 9.5 8.9 - 10.3 mg/dL   GFR, Estimated >60 >60 mL/min   Anion gap 10 5 - 15  CBC     Status: Abnormal   Collection Time: 11/21/21 11:38 AM  Result Value Ref Range   WBC 19.8 (H) 4.0 - 10.5 K/uL   RBC 4.76 3.87 - 5.11 MIL/uL   Hemoglobin 13.4 12.0 - 15.0 g/dL   HCT 40.1 36.0 - 46.0 %   MCV 84.2 80.0 - 100.0 fL   MCH 28.2 26.0 - 34.0 pg   MCHC 33.4 30.0 - 36.0 g/dL   RDW 13.5 11.5 - 15.5 %   Platelets 328 150 - 400 K/uL   nRBC 0.0 0.0 - 0.2 %  Protime-INR     Status: None   Collection Time: 11/21/21 11:38 AM  Result Value Ref Range   Prothrombin Time 13.9 11.4 - 15.2 seconds   INR 1.1 0.8 - 1.2  APTT     Status: None   Collection Time: 11/21/21 11:38 AM  Result Value Ref Range   aPTT 34 24 - 36 seconds   *Note: Due to a large number of results and/or encounters for the requested time period, some results have not been displayed. A complete set of results can be found in Results Review.  ____________________________________________  EKG My review and personal interpretation at Time: 11:38   Indication: ams sepsis  Rate: 80  Rhythm: sinus Axis: normal Other: nonspecific t wave abn, no stemi ____________________________________________  RADIOLOGY  I personally reviewed all radiographic images ordered to evaluate for the above acute complaints and reviewed radiology reports and findings.  These findings were personally discussed with the patient.  Please see medical record for radiology report.  ____________________________________________   PROCEDURES  Procedure(s) performed:  .Critical Care Performed by: Merlyn Lot, MD Authorized by: Merlyn Lot, MD   Critical care provider statement:    Critical care time (minutes):  35    Critical care was necessary to treat or prevent imminent or life-threatening deterioration of the following conditions:  Sepsis and respiratory failure   Critical care was time spent personally by me on the following activities:  Ordering and performing treatments and interventions, ordering and review of laboratory studies, ordering and review of radiographic studies, pulse oximetry, re-evaluation of patient's condition, review of old charts, obtaining history from patient or surrogate, examination of patient, evaluation of patient's response to treatment, discussions with primary provider, discussions with consultants and development of treatment plan with patient or surrogate    Critical Care performed: yes ____________________________________________   INITIAL IMPRESSION / [place] / ED COURSE  Pertinent labs & imaging results that were available during my care of the patient were reviewed by me and considered in my medical decision making (see chart for details).   DDX: sepsis, pna, asthma, aspiration, uti, pyelo  Erika [name] is a 50 y.o. who presents to the ED with presentation as described above.  She was found to be hypoxic on room air at 85% requiring supplemental oxygen.  Lung findings and chest x-ray concerning for pneumonia.  Possible aspiration does have a cytosis which is acute.  Low blood pressure but improving after IV fluids.  Patient was given IV fluid resuscitation septic work-up sent.  There was some delay due to patient being difficult IV stick but has received IV antibiotics.  X-ray of the ankle also showed evidence of likely avulsion fracture of the medial malleolus.  I discussed case with Dr. Roland Rack of orthopedics who recommends air splint patient will be weightbearing as tolerated.  Her abdominal exam is soft and benign.  We will discussed the case in consultation with hospitalist for admission.     The patient was evaluated in Emergency Department today  for the symptoms described in the history of present illness. He/she was evaluated in the context of the global COVID-19 pandemic, which necessitated consideration that the patient might be at risk for infection with the SARS-CoV-2 virus that causes COVID-19. Institutional protocols and algorithms that pertain to the evaluation of patients at risk for COVID-19 are in a state of rapid change based on information released by regulatory bodies including the CDC and federal and state organizations. These policies and algorithms were followed during the patient's care in the ED.  As part of my medical decision making, I reviewed the following data within the [facility] notes reviewed and incorporated, Labs reviewed, notes from prior ED visits and [place] Controlled Substance Database   ____________________________________________   FINAL CLINICAL IMPRESSION(S) / ED DIAGNOSES  Final diagnoses:  Sepsis with acute hypoxic respiratory failure, due to unspecified organism, unspecified whether septic shock present ([place])  Closed avulsion fracture of medial malleolus of left tibia, initial encounter      NEW MEDICATIONS STARTED DURING THIS VISIT:  New Prescriptions   No medications on file     Note:  This document was prepared using Dragon voice recognition software and may include unintentional dictation errors.    Merlyn Lot, MD 11/21/21 [phone]

## 2021-11-21 NOTE — ED Notes (Signed)
Ace wrap & Aircast applied; patient tolerated well

## 2021-11-21 NOTE — ED Notes (Signed)
A&O x 4. No complaints. Pure wick placed to low wall suction per patient request. Air cast in place to left ankle, left ankle elevated on 2 pillows. VSS. Side rails up x 2. Call bell in reach.

## 2021-11-21 NOTE — Sepsis Progress Note (Signed)
eLink is monitoring this Code Sepsis. °

## 2021-11-22 ENCOUNTER — Ambulatory Visit: Payer: Managed Care, Other (non HMO)

## 2021-11-22 ENCOUNTER — Ambulatory Visit: Payer: Managed Care, Other (non HMO) | Admitting: Speech Pathology

## 2021-11-22 DIAGNOSIS — R6520 Severe sepsis without septic shock: Secondary | ICD-10-CM

## 2021-11-22 DIAGNOSIS — S82892A Other fracture of left lower leg, initial encounter for closed fracture: Secondary | ICD-10-CM

## 2021-11-22 DIAGNOSIS — S8252XA Displaced fracture of medial malleolus of left tibia, initial encounter for closed fracture: Secondary | ICD-10-CM

## 2021-11-22 DIAGNOSIS — A419 Sepsis, unspecified organism: Principal | ICD-10-CM

## 2021-11-22 LAB — BASIC METABOLIC PANEL
Anion gap: 6 (ref 5–15)
BUN: 12 mg/dL (ref 6–20)
CO2: 25 mmol/L (ref 22–32)
Calcium: 9.2 mg/dL (ref 8.9–10.3)
Chloride: 103 mmol/L (ref 98–111)
Creatinine, Ser: 0.73 mg/dL (ref 0.44–1.00)
GFR, Estimated: >60 mL/min (ref >60)
Glucose, Bld: 124 mg/dL — ABNORMAL HIGH (ref 70–99)
Potassium: 3.5 mmol/L (ref 3.5–5.1)
Sodium: 134 mmol/L — ABNORMAL LOW (ref 135–145)

## 2021-11-22 LAB — CBC
HCT: 34.7 % — ABNORMAL LOW (ref 36.0–46.0)
Hemoglobin: 11.3 g/dL — ABNORMAL LOW (ref 12.0–15.0)
MCH: 27.4 pg (ref 26.0–34.0)
MCHC: 32.6 g/dL (ref 30.0–36.0)
MCV: 84.2 fL (ref 80.0–100.0)
Platelets: 302 10*3/uL (ref 150–400)
RBC: 4.12 MIL/uL (ref 3.87–5.11)
RDW: 13.9 % (ref 11.5–15.5)
WBC: 16.6 10*3/uL — ABNORMAL HIGH (ref 4.0–10.5)
nRBC: 0 % (ref 0.0–0.2)

## 2021-11-22 LAB — CBG MONITORING, ED: Glucose-Capillary: 131 mg/dL — ABNORMAL HIGH (ref 70–99)

## 2021-11-22 LAB — GLUCOSE, CAPILLARY
Glucose-Capillary: 188 mg/dL — ABNORMAL HIGH (ref 70–99)
Glucose-Capillary: 124 mg/dL — ABNORMAL HIGH (ref 70–99)

## 2021-11-22 LAB — HEMOGLOBIN A1C
Hgb A1c MFr Bld: 7 % — ABNORMAL HIGH (ref 4.8–5.6)
Mean Plasma Glucose: 154 mg/dL

## 2021-11-22 LAB — PROCALCITONIN: Procalcitonin: 19.24 ng/mL

## 2021-11-22 MED ORDER — MORPHINE SULFATE (PF) 2 MG/ML IV SOLN
1.0000 mg | Freq: Four times a day (QID) | INTRAVENOUS | Status: DC | PRN
  Administered 2021-11-22 – 2021-11-23 (×2): 1 mg via SUBCUTANEOUS
  Filled 2021-11-22 (×4): qty 1

## 2021-11-22 MED ORDER — MORPHINE SULFATE (PF) 2 MG/ML IV SOLN
1.0000 mg | Freq: Four times a day (QID) | INTRAVENOUS | Status: DC | PRN
Start: 2021-11-22 — End: 2021-11-22

## 2021-11-22 MED ORDER — HYDROCODONE-ACETAMINOPHEN 5-325 MG PO TABS
1.0000 | ORAL_TABLET | Freq: Four times a day (QID) | ORAL | Status: DC | PRN
Start: 2021-11-22 — End: 2021-11-27
  Administered 2021-11-22 – 2021-11-27 (×14): 2 via ORAL
  Filled 2021-11-22 (×16): qty 2

## 2021-11-22 MED ORDER — LEVOFLOXACIN IN D5W 750 MG/150ML IV SOLN
750.0000 mg | INTRAVENOUS | Status: DC
  Administered 2021-11-22 – 2021-11-24 (×3): 750 mg via INTRAVENOUS
  Filled 2021-11-22 (×3): qty 150

## 2021-11-22 MED ORDER — AZITHROMYCIN 500 MG PO TABS: 500.0000 mg | ORAL_TABLET | Freq: Every day | ORAL | Status: DC

## 2021-11-22 MED ORDER — SODIUM CHLORIDE 0.9 % IV SOLN
1.0000 g | INTRAVENOUS | Status: DC
  Filled 2021-11-22: qty 10

## 2021-11-22 NOTE — Progress Notes (Signed)
Patient arrived to unit in stable condition from ER. Alert and oriented. Patient unable to ambulate at this time. At baseline patient uses hemi walker to ambulate, as she has baseline left hemiplegia from previous stroke. Air cast to left ankle in place. +CMS. Oriented to room and unit. Verbalized understanding. Able to operate call light.

## 2021-11-22 NOTE — Progress Notes (Signed)
Patient sleeping. NAD. Husband called to check on patient. General update given.

## 2021-11-22 NOTE — Progress Notes (Addendum)
[facility] NAME: Erika [name]    MR#:  [id number]  DATE OF BIRTH:  05-08-1971  SUBJECTIVE:   mother at bedside patient came in after she had a mechanical fall while trying to take steps getting up from the toilet to her chair. She developed a left medial malleolus  fracture. Patient also has been having coughing spell. Found to have pneumonia. Currently afebrile. No respiratory distress. REVIEW OF SYSTEMS:   Review of Systems  Constitutional:  Negative for chills, fever and weight loss.  HENT:  Negative for ear discharge, ear pain and nosebleeds.   Eyes:  Negative for blurred vision, pain and discharge.  Respiratory:  Positive for cough, sputum production and shortness of breath. Negative for wheezing and stridor.   Cardiovascular:  Negative for chest pain, palpitations, orthopnea and PND.  Gastrointestinal:  Negative for abdominal pain, diarrhea, nausea and vomiting.  Genitourinary:  Negative for frequency and urgency.  Musculoskeletal:  Negative for back pain and joint pain.  Neurological:  Positive for focal weakness and weakness. Negative for sensory change and speech change.  Psychiatric/Behavioral:  Negative for depression and hallucinations. The patient is not nervous/anxious.   Tolerating Diet yes Tolerating PT:   DRUG ALLERGIES:   Allergies  Allergen Reactions   Chantix [Varenicline Tartrate] Other (See Comments)    Feels "crazy" and angry     VITALS:  Blood pressure (!) 167/77, pulse 97, temperature 98.4 F (36.9 C), resp. rate 18, height 5\' 7"  (1.702 m), weight 98.9 kg, SpO2 94 %.  PHYSICAL EXAMINATION:   Physical Exam  GENERAL:  50 y.o.-year-old patient lying in the bed with no acute distress. obese HEENT: Head atraumatic, normocephalic. Oropharynx and nasopharynx clear.  LUNGS: decreased breath sounds bilaterally, no wheezing, rales, rhonchi. No use of accessory muscles of respiration.   CARDIOVASCULAR: S1, S2 normal. No murmurs, rubs, or gallops.  ABDOMEN: Soft, nontender, nondistended. Bowel sounds present. No organomegaly or mass.  EXTREMITIES: No cyanosis, clubbing or edema b/l.   Left lower extremity Aircast. NEUROLOGIC: chronic left upper extremity hemiparesis due to previous stroke. PSYCHIATRIC:  patient is alert and oriented x 3.  SKIN: No obvious rash, lesion, or ulcer.   LABORATORY PANEL:  CBC Recent Labs  Lab 11/22/21 0650  WBC 16.6*  HGB 11.3*  HCT 34.7*  PLT 302    Chemistries  Recent Labs  Lab 11/22/21 0650  NA 134*  K 3.5  CL 103  CO2 25  GLUCOSE 124*  BUN 12  CREATININE 0.73  CALCIUM 9.2   Cardiac Enzymes No results for input(s): TROPONINI in the last 168 hours. RADIOLOGY:  DG Tibia/Fibula Left  Result Date: 11/21/2021 CLINICAL DATA:  Ankle fracture EXAM: LEFT TIBIA AND FIBULA - 2 VIEW COMPARISON:  None. FINDINGS: There is no evidence of fracture or other focal bone lesions. Soft tissues are unremarkable. IMPRESSION: No acute fracture identified in the tibia or fibula. Electronically Signed   By: Ofilia Neas M.D.   On: 11/21/2021 19:15   DG Ankle Complete Left  Result Date: 11/21/2021 CLINICAL DATA:  LEFT ankle and knee pain EXAM: LEFT ANKLE COMPLETE - 3+ VIEW COMPARISON:  None. FINDINGS: Small avulsion fragment fragments inferior to the medial malleolus. Ankle mortise intact. Talar dome is normal. No joint effusion. IMPRESSION: Age-indeterminate avulsion fracture from the tip of the medial malleolus. Electronically Signed   By: Suzy Bouchard M.D.   On: 11/21/2021 13:21   DG Chest Portable 1 View  Result Date: 11/21/2021 CLINICAL DATA:  Shortness of breath EXAM: PORTABLE CHEST 1 VIEW COMPARISON:  09/08/2021 FINDINGS: Patchy opacities at the left greater than right lung bases. No pleural effusion. Stable cardiomediastinal contours. IMPRESSION: Patchy opacities at the left greater than right lung bases suspicious for pneumonia.  Electronically Signed   By: Macy Mis M.D.   On: 11/21/2021 14:46   DG Knee Complete 4 Views Left  Result Date: 11/21/2021 CLINICAL DATA:  Left knee pain after fall. EXAM: LEFT KNEE - COMPLETE 4+ VIEW COMPARISON:  None. FINDINGS: No evidence of fracture, dislocation, or joint effusion. No evidence of arthropathy or other focal bone abnormality. Soft tissues are unremarkable. IMPRESSION: Negative. Electronically Signed   By: Marijo Conception M.D.   On: 11/21/2021 13:20   ASSESSMENT AND PLAN:   Erika [name] is a 50 y.o. Caucasian female with medical history significant for coronary artery disease, carotid artery disease, diastolic dysfunction, depression, PAD and PTSD, who presented to the ER with acute onset of worsening cough over the last week and this area got significantly worse since yesterday She had an accidental mechanical fall yesterday with subsequent left foot pain   Chest x-ray showed patchy opacities of the left greater than the right lung bases suspicious for pneumonia.   Left ankle x-ray showed age-indeterminate avulsion fracture from the tip of the medial malleolus.   Left knee x-ray was negative.  Acute hypoxic respiratory failure secondary to community acquired pneumonia -- patient came in with increasing cough shortness of breath and briefly dropped her sats 85% on room air. Currently on 4 L nasal cannula oxygen was sats 94% -- IV Levaquin -- chest x-ray positive for pneumonia -- blood culture negative -- Pro calcitonin 19.2 -- white count trending 19K--16K  Avulsion fracture of medial malleolus left foot status post mechanical fall at home -- PRN pain meds -- seen by Dr Roland Rack-- recommends Air cast and weight-bearing as tolerated with PT  history of CVA with chronic left upper extremity paresis -- continue aspirin Plavix -- continue statins  type II diabetes with hyperglycemia -- continue insulin and sliding scale  coronary artery disease --continue her  Imdur, as needed sublingual nitroglycerin, statin therapy, Plavix and Coreg.  Procedures: Family communication : mother at bedside and Husband [name] on the phone Consults : orthopedic CODE STATUS: full DVT Prophylaxis : enoxaparin Level of care: Telemetry Medical Status is: Inpatient  Remains inpatient appropriate because: Pneumonia and fracture        TOTAL TIME TAKING CARE OF THIS PATIENT: 35 minutes.  >50% time spent on counselling and coordination of care  Note: This dictation was prepared with Dragon dictation along with smaller phrase technology. Any transcriptional errors that result from this process are unintentional.  Fritzi Mandes M.D    [facility]   CC: Primary care physician; Lesleigh Noe, MD Patient ID: Erika [name], female   DOB: 07/16/1971, 50 y.o.   MRN: [id number]

## 2021-11-22 NOTE — Evaluation (Signed)
Physical Therapy Evaluation Patient Details Name: Erika [name] MRN: [id number] DOB: 1971-05-14 Today's Date: 11/22/2021  History of Present Illness  Pt is a 50 y.o. Caucasian female with PMH significant for coronary artery disease, carotid artery disease, diastolic dysfunction, depression, PAD, clotting disorder, fatty liver disease, blood transfusion, myocardial infarction, multiple strokes left with L sided hemiplegia, and PTSD, who presented to the ER with acute onset of worsening cough over the last week and an accidental mechanical fall yesterday with subsequent left foot pain. MD assessment includes: community-acquired pneumonia with subsequent acute hypoxic respiratory failure and mild metabolic encephalopathy, avulsion fracture of the tip of the left medial malleolus, and possible UTI.   Clinical Impression  Pt was pleasant and agreeable to participate during the session and put forth good effort throughout. Pt was oriented to self, birthday, and situation, but husband helped to confirm living situation and PLOF. Pt was able to complete all ther ex in bed, with some difficulty on LLE due to pain. Pt required min assist with bed mobility for LLE and trunk control. Pt demonstrated good sitting balance EOB with RUE support and feet supported. Pt required mod assist for STS using hemi-walker and was reliant on it for balance. Pt was unable to take steps forward from EOB and could only take lateral shuffle steps toward HOB. Pt could transfer to recliner via lateral scoot method with min assist to get over the lip of the arm rest. Pt's SpO2 and HR remained WNL throughout the session on 4L/O2. Pt will benefit from PT services in a SNF setting upon discharge to safely address deficits listed in patient problem list for decreased caregiver assistance and eventual return to PLOF.   Recommendations for follow up therapy are one component of a multi-disciplinary discharge planning process, led by the  attending physician.  Recommendations may be updated based on patient status, additional functional criteria and insurance authorization.  Follow Up Recommendations Skilled nursing-short term rehab (<3 hours/day)    Assistance Recommended at Discharge Frequent or constant Supervision/Assistance  Functional Status Assessment Patient has had a recent decline in their functional status and demonstrates the ability to make significant improvements in function in a reasonable and predictable amount of time.  Equipment Recommendations  None recommended by PT    Recommendations for Other Services       Precautions / Restrictions Precautions Precautions: Fall Restrictions Weight Bearing Restrictions: Yes LLE Weight Bearing: Weight bearing as tolerated (with aircast or CAM boot)      Mobility  Bed Mobility Overal bed mobility: Needs Assistance Bed Mobility: Supine to Sit     Supine to sit: Min assist     General bed mobility comments: Pt was unable to roll, min assist with LLE control and trunk control, increased time and effort    Transfers Overall transfer level: Needs assistance Equipment used: Hemi-walker Transfers: Sit to/from Stand;Bed to chair/wheelchair/BSC Sit to Stand: Mod assist          Lateral/Scoot Transfers: Min assist General transfer comment: Increased time and effort, mod assist due to LLE injury and LUE hemiplegia, min asssit with scoot transfer to get over lip of chair arm    Ambulation/Gait Ambulation/Gait assistance: Mod assist Gait Distance (Feet): 2 Feet Assistive device: Hemi-walker Gait Pattern/deviations: Step-to pattern;Decreased weight shift to left;Decreased step length - right;Decreased step length - left;Shuffle Gait velocity: decreased     General Gait Details: Mod assist due to LLE injury and LUE hemiplegia, only could take lateral shuffle steps at EOB  and unable to take steps forward, scoot transfer from EOB to recliner  Stairs             Wheelchair Mobility    Modified Rankin (Stroke Patients Only)       Balance Overall balance assessment: Needs assistance Sitting-balance support: Single extremity supported;Feet supported (RUE support) Sitting balance-Leahy Scale: Good     Standing balance support: Single extremity supported;During functional activity;Reliant on assistive device for balance Standing balance-Leahy Scale: Fair Standing balance comment: Hardly able to WB through LLE due to pain, reliant on hemi walker for balance                             Pertinent Vitals/Pain Pain Assessment: 0-10 Pain Score: 7  Pain Location: L foot/ankle Pain Descriptors / Indicators: Aching;Discomfort;Sore Pain Intervention(s): Patient requesting pain meds-RN notified;Repositioned;Limited activity within patient's tolerance    Home Living Family/patient expects to be discharged to:: Private residence Living Arrangements: Spouse/significant other;Children (Husband and daughter who is special needs) Available Help at Discharge: Family;Available PRN/intermittently (Husband and mother-in-law) Type of Home: House Home Access: Ramped entrance       Home Layout: One level Home Equipment: Tub bench;Wheelchair - manual;Cane - single point;Other (comment);Rolling Walker (2 wheels) (Hemi walker)      Prior Function Prior Level of Function : Needs assist;History of Falls (last six months)       Physical Assist : Mobility (physical);ADLs (physical) Mobility (physical): Transfers;Gait;Stairs ADLs (physical): Bathing;Dressing Mobility Comments: Husband reports pt needed mod to max A with all mobility, used hemi walker or RW for ambulation, 7 or 8 falls in the past month ADLs Comments: Husband reports pt needed min to mod A with all ADLs, stated they did stand pivot transfers to tub bench     Hand Dominance        Extremity/Trunk Assessment   Upper Extremity Assessment Upper Extremity Assessment: LUE  deficits/detail LUE Deficits / Details: L sided hemiplegia from previous CVA    Lower Extremity Assessment Lower Extremity Assessment: Generalized weakness;LLE deficits/detail LLE Deficits / Details: L anke injury       Communication   Communication: No difficulties  Cognition Arousal/Alertness: Awake/alert Behavior During Therapy: WFL for tasks assessed/performed Overall Cognitive Status: Within Functional Limits for tasks assessed                                          General Comments      Exercises Total Joint Exercises Ankle Circles/Pumps: AROM;Right;10 reps;Supine Quad Sets: AROM;Both;10 reps;Supine Other Exercises Other Exercises: Pt education on lateral scoot transfers   Assessment/Plan    PT Assessment Patient needs continued PT services  PT Problem List Decreased strength;Decreased activity tolerance;Decreased balance;Decreased mobility;Decreased knowledge of use of DME;Pain       PT Treatment Interventions DME instruction;Gait training;Stair training;Functional mobility training;Therapeutic activities;Therapeutic exercise;Balance training;Patient/family education    PT Goals (Current goals can be found in the Care Plan section)  Acute Rehab PT Goals Patient Stated Goal: to get better PT Goal Formulation: With patient Time For Goal Achievement: 12/05/21 Potential to Achieve Goals: Fair    Frequency Min 2X/week   Barriers to discharge        Co-evaluation               AM-PAC PT "6 Clicks" Mobility  Outcome Measure Help needed turning from your  back to your side while in a flat bed without using bedrails?: A Little Help needed moving from lying on your back to sitting on the side of a flat bed without using bedrails?: A Little Help needed moving to and from a bed to a chair (including a wheelchair)?: A Lot Help needed standing up from a chair using your arms (e.g., wheelchair or bedside chair)?: A Little Help needed to walk  in hospital room?: A Lot Help needed climbing 3-5 steps with a railing? : Total 6 Click Score: 14    End of Session Equipment Utilized During Treatment: Gait belt;Oxygen Activity Tolerance: Patient limited by pain Patient left: in chair;with call bell/phone within reach;with chair alarm set;with nursing/sitter in room;Other (comment) (Pt requested aircast splint to be removed while seated) Nurse Communication: Mobility status PT Visit Diagnosis: Unsteadiness on feet (R26.81);Muscle weakness (generalized) (M62.81);History of falling (Z91.81);Difficulty in walking, not elsewhere classified (R26.2);Hemiplegia and hemiparesis;Pain Hemiplegia - Right/Left: Left Hemiplegia - dominant/non-dominant: Non-dominant Hemiplegia - caused by: Cerebral infarction Pain - Right/Left: Left Pain - part of body: Ankle and joints of foot    Time: 8502-7741 PT Time Calculation (min) (ACUTE ONLY): 35 min   Charges:              Sheldon Silvan SPT 11/22/21, 4:45 PM

## 2021-11-22 NOTE — ED Notes (Signed)
Levada Dy RN aware of assigned bed

## 2021-11-22 NOTE — Progress Notes (Signed)
Patient spilled water on self. Patient cleaned up. Patient wearing own pull up from home, saturated with urine. New purewick and brief in place. Patient in no distress, denies any needs at this time.

## 2021-11-23 ENCOUNTER — Inpatient Hospital Stay: Payer: Managed Care, Other (non HMO)

## 2021-11-23 ENCOUNTER — Ambulatory Visit: Payer: Managed Care, Other (non HMO) | Admitting: Cardiovascular Disease

## 2021-11-23 LAB — URINE CULTURE: Culture: 30000 — AB

## 2021-11-23 LAB — GLUCOSE, CAPILLARY
Glucose-Capillary: 125 mg/dL — ABNORMAL HIGH (ref 70–99)
Glucose-Capillary: 130 mg/dL — ABNORMAL HIGH (ref 70–99)
Glucose-Capillary: 123 mg/dL — ABNORMAL HIGH (ref 70–99)
Glucose-Capillary: 125 mg/dL — ABNORMAL HIGH (ref 70–99)

## 2021-11-23 LAB — LEGIONELLA PNEUMOPHILA SEROGP 1 UR AG: L. pneumophila Serogp 1 Ur Ag: NEGATIVE

## 2021-11-23 MED ORDER — MORPHINE SULFATE (PF) 2 MG/ML IV SOLN
1.0000 mg | Freq: Four times a day (QID) | INTRAVENOUS | Status: DC | PRN
  Administered 2021-11-23: 1 mg via INTRAVENOUS

## 2021-11-23 MED ORDER — MORPHINE SULFATE (PF) 2 MG/ML IV SOLN
1.0000 mg | Freq: Three times a day (TID) | INTRAVENOUS | Status: DC | PRN
  Administered 2021-11-23 – 2021-11-25 (×3): 1 mg via INTRAVENOUS
  Filled 2021-11-23 (×3): qty 1

## 2021-11-23 NOTE — TOC Initial Note (Signed)
Transition of Care [facility]) - Initial/Assessment Note    Patient Details  Name: SANDE PICKERT MRN: [id number] Date of Birth: 03/14/1971  Transition of Care [facility]) CM/SW Contact:    Shelbie Hutching, RN Phone Number: 11/23/2021, 1:31 PM  Clinical Narrative:                 Patient admitted to the hospital with community acquired pneumonia.  Patient has a history of stroke this July then she went to [facility] inpatient rehab.  She has been at home with her husband and daughter since then.  She reports falling multiple times at home over the past few weeks, this last fall she fractured her ankle.  Her mother in law has been helping her at home and her daughter who has Asperger's.  She uses a hemi walker, she also has a bedside commode, and wheelchair.  Her mother in law says that she needs help before she can go home.  Patient is interested in going back to inpatient rehab over at [facility].  PT has worked with patient and thinks that may be a very good idea for her.  OT has been ordered also.    Expected Discharge Plan: IP Rehab Facility Barriers to Discharge: Continued Medical Work up   Patient Goals and CMS Choice Patient states their goals for this hospitalization and ongoing recovery are:: Patient and family are interested in rehab, she liked [facility] inpatient rehab and would like to go back there if possible CMS Medicare.gov Compare Post Acute Care list provided to:: Patient Choice offered to / list presented to : Patient  Expected Discharge Plan and Services Expected Discharge Plan: [place]   Discharge Planning Services: CM Consult Post Acute Care Choice: IP Rehab Living arrangements for the past 2 months: Single Family Home                 DME Arranged: [name] DME Agency: NA       HH Arranged: NA HH Agency: NA        Prior Living Arrangements/Services Living arrangements for the past 2 months: Single Family Home Lives with:: Spouse, Adult Children Patient language and need  for interpreter reviewed:: Yes Do you feel safe going back to the place where you live?: Yes      Need for Family Participation in Patient Care: Yes (Comment) (previous CVA- ankle fracture) Care giver support system in place?: Yes (comment) (husband, mother in law) Current home services: DME (hemi walker, wheelchair, tub bench/ transfer bench, bedside commode) Criminal Activity/Legal Involvement Pertinent to Current Situation/Hospitalization: No - Comment as needed  Activities of Daily Living Home Assistive Devices/Equipment: CBG Meter, Walker (specify type) ADL Screening (condition at time of admission) Patient's cognitive ability adequate to safely complete daily activities?: Yes Is the patient deaf or have difficulty hearing?: No Does the patient have difficulty seeing, even when wearing glasses/contacts?: No Does the patient have difficulty concentrating, remembering, or making decisions?: No Patient able to express need for assistance with ADLs?: Yes Does the patient have difficulty dressing or bathing?: Yes Independently performs ADLs?: No Does the patient have difficulty walking or climbing stairs?: Yes Weakness of Legs: Left Weakness of Arms/Hands: Left  Permission Sought/Granted Permission sought to share information with : Case Manager, Customer service manager, Family Supports Permission granted to share information with : Yes, Verbal Permission Granted  Share Information with NAME: Emiliana Blaize  Permission granted to share info w AGENCY: inpatient rehab  Permission granted to share info w  Relationship: husband  Permission granted to share info w Contact Information: [phone]  Emotional Assessment Appearance:: Appears stated age Attitude/Demeanor/Rapport: Engaged, Lethargic Affect (typically observed): Accepting Orientation: : Oriented to Self, Oriented to Place, Oriented to  Time, Oriented to Situation Alcohol / Substance Use: Not Applicable Psych  Involvement: No (comment)  Admission diagnosis:  CAP (community acquired pneumonia) [J18.9] Ankle fracture, left [S82.892A] Closed avulsion fracture of medial malleolus of left tibia, initial encounter [S82.52XA] Sepsis with acute hypoxic respiratory failure, due to unspecified organism, unspecified whether septic shock present ([place]) [A41.9, R65.20, J96.01] Patient Active Problem List   Diagnosis Date Noted   Ankle fracture, left    Closed avulsion fracture of medial malleolus of left tibia    CAP (community acquired pneumonia) 11/21/2021   Cerebrovascular accident (CVA) due to occlusion of vertebral artery ([place]) 09/11/2021   Cognitive change 09/11/2021   Cognitive communication deficit 08/10/2021   Urinary incontinence 08/10/2021   GERD (gastroesophageal reflux disease) 08/03/2021   Chronic post-traumatic stress disorder (PTSD)    Depression with anxiety    Right pontine stroke ([place]) 06/28/2021   Hemiplegia and hemiparesis following cerebral infarction affecting left non-dominant side ([place]) 06/24/2021   Chronic diastolic CHF (congestive heart failure) ([place]) 06/24/2021   Fall 06/24/2021   Abnormal LFTs 06/24/2021   Olecranon bursitis of left elbow 06/12/2021   Chronic hip pain (Bilateral) 06/09/2021   Chronic use of opiate for therapeutic purpose 05/09/2021   Greater trochanteric bursitis of hip (Left) 03/09/2021   Bursitis of hip (Left) 02/40/9735   Uncomplicated opioid dependence ([place]) 02/20/2021   Acute conjunctivitis of left eye 01/02/2021   Unintentional weight loss 11/21/2020   Broken teeth (Right) 08/02/2020   History of MI (myocardial infarction) (July 2017) 08/02/2020   Neuropathy 06/09/2020   Dental abscess 06/09/2020   Foot drop (Left) 06/09/2020   Carotid stenosis, asymptomatic, right 04/27/2020   Chronic migraine 11/03/2019   Thrombocytosis 09/16/2019   Erythrocytosis 09/16/2019   Hypercalcemia 09/06/2019   Leukocytosis 09/06/2019   Polycythemia 09/06/2019    Chronic hip pain (Left) 08/27/2019   Osteoarthritis of hip (Left) 08/27/2019   Gluteal tendonitis of buttock (Left) 08/27/2019   Radial nerve palsy (Right) 07/15/2019   Neuropathy of radial nerve (Right) 06/30/2019   Abnormal bruising 06/25/2019   History of carotid endarterectomy (Right) 03/04/2019   Pain medication agreement signed 03/04/2019   Atypical facial pain (Right) 01/27/2019   Chronic ear pain (Right) 01/27/2019   Chronic jaw pain (Right) 01/27/2019   Geniculate Neuralgia (Right) 01/27/2019   Vitamin D deficiency 01/19/2019   Neurogenic pain 01/19/2019   Chronic anticoagulation (PLAVIX) 01/19/2019   Chronic pain syndrome 01/07/2019   Long term current use of opiate analgesic 01/07/2019   Long term prescription benzodiazepine use 01/07/2019   Pharmacologic therapy 01/07/2019   Disorder of skeletal system 01/07/2019   Problems influencing health status 01/07/2019   Chronic headaches (1ry area of Pain) (Right) 01/07/2019   Opiate use 09/24/2018   PVD (peripheral vascular disease) ([place]) 06/24/2018   Chronic ankle pain (Bilateral) 12/27/2017   Tendinopathy of gluteus medius (Right) 12/27/2017   Tendinopathy of gluteus medius (Left) 12/27/2017   Bilateral hip pain 12/26/2017   Chronic elbow pain (Left) 10/17/2017   Elevated troponin I level 05/21/2017   Sepsis with acute hypoxic respiratory failure ([place]) 05/19/2017   Major depressive disorder, recurrent episode, moderate (HCC) 11/19/2016   Insomnia 10/30/2016   Constipation 07/25/2016   S/P CABG x 4 07/06/2016   Bradycardia    CAD (coronary artery  disease)    Carotid stenosis    CAD in native artery 06/29/2016   Elevated troponin 06/28/2016   Essential hypertension, malignant 06/28/2016   Mild tobacco abuse in early remission 06/28/2016   Essential hypertension    Malignant hypertension    Type 2 diabetes mellitus with other specified complication (HCC)    Chest pain with high risk for cardiac etiology 06/27/2016    NSTEMI (non-ST elevated myocardial infarction) ([place]) 06/27/2016   Proteinuria 03/15/2016   Renal artery stenosis ([place]) 03/15/2016   MDD (major depressive disorder) 10/17/2015   Agoraphobia with panic attacks 04/25/2015   HTN (hypertension), malignant 10/20/2013   Cluster headache 03/20/2012   HLD (hyperlipidemia) 07/26/2010   ADJUSTMENT DISORDER WITH MIXED FEATURES 07/26/2010   PCP:  Lesleigh Noe, MD Pharmacy:   [id number] [place], [place] [address] AT [address] [place] [address] Phone: [phone] Fax: [phone]  [id number] [name] [address] [place] [address] Phone: [phone] Fax: [phone]     Social Determinants of Health (SDOH) Interventions    Readmission Risk Interventions Readmission Risk Prevention Plan 11/23/2021 04/28/2020  Transportation Screening Complete Complete  PCP or Specialist Appt within 5-7 Days - Complete  Home Care Screening - Complete  Medication Review (RN CM) - Complete  Medication Review ([place]) Complete -  PCP or Specialist appointment within 3-5 days of discharge Complete -  [place] or Home Care Consult Complete -  SW Recovery Care/Counseling Consult Complete -  Palliative Care Screening Not Applicable -  Skilled Nursing Facility Complete -  Some recent data might be hidden

## 2021-11-23 NOTE — Evaluation (Signed)
Occupational Therapy Evaluation Patient Details Name: Erika [name] MRN: [id number] DOB: 1971/08/05 Today's Date: 11/23/2021   History of Present Illness Pt is a 50 y.o. Caucasian female with PMH significant for coronary artery disease, carotid artery disease, diastolic dysfunction, depression, PAD, clotting disorder, fatty liver disease, blood transfusion, myocardial infarction, multiple strokes left with L sided hemiplegia, and PTSD, who presented to the ER with acute onset of worsening cough over the last week and an accidental mechanical fall yesterday with subsequent left foot pain. MD assessment includes: community-acquired pneumonia with subsequent acute hypoxic respiratory failure and mild metabolic encephalopathy, avulsion fracture of the tip of the left medial malleolus, and possible UTI.   Clinical Impression   Pt seen for OT evaluation this date in setting of acute hospitalization w/ PNA and L ankle fx. Pt's spouse present and reports that he does assist with some LB ADLs such as bathing and dressing and with pivoting transfers at baseline. States that pt can usually take some steps with hemi walker, but was getting worse in recent days leading up to fall and hospitalization. Pt presents this date with no c/o pain in L ankle. On ADL assessment, she is requiring MOD/MAX A for arm in arm SPS transfers to/from [facility]. She requires MAX A +2 for peri care (1p for standing balance and 1p for actual ADL task. OT checks O2 when pt transfers to commode chair (on 4L at start of session and stable) and notes taht pt's O2 has dropped significantly to 74%. Ed with pt re: PLB rationale and technique to imrpove oxygenation. OT increases supplemental O2 to 6L to improve sats and engages pt in PLB, but pt only increases to 82% with this method after ~2 minutes, turned up to 8Lnc and placed in pt's mouth with only progress to 85% after ~3 more minutes, at this time, OT was preparing to call RN, but  respiratory therapy presents and provides breathing treatment while also leaving nasal cannula in nares. Pt finally able to advance saturations to 88% (after a collective ~8+ minutes, at no time was pt distressed). RT updated on pt's performance and saturations during light activity. All of this reported to RN including concerns for worsening L facial droop noted with assessment. Pt left sitting EOB at end of session with RT and RN assessing and her spouse present. RN notified MD of concerns as well. Will continue to follow. At this time, pt with significant mobility and self care deficits. Anticipate she will benefit from continued OT efforts in acute hospital setting and recommend she follows up with acute inpatient rehabilitation OT services.      Recommendations for follow up therapy are one component of a multi-disciplinary discharge planning process, led by the attending physician.  Recommendations may be updated based on patient status, additional functional criteria and insurance authorization.   Follow Up Recommendations  Acute inpatient rehab (3hours/day)    Assistance Recommended at Discharge Frequent or constant Supervision/Assistance  Functional Status Assessment  Patient has had a recent decline in their functional status and demonstrates the ability to make significant improvements in function in a reasonable and predictable amount of time.  Equipment Recommendations  Other (comment) (defer to next level of care.)    Recommendations for Other Services       Precautions / Restrictions Precautions Precautions: Fall Restrictions Weight Bearing Restrictions: Yes LLE Weight Bearing: Weight bearing as tolerated (with aircast or CAM boot donned)      Mobility Bed Mobility Overal bed mobility: Needs  Assistance Bed Mobility: Supine to Sit     Supine to sit: Min assist;Min guard;HOB elevated     General bed mobility comments: increased time, use of bed rails, assist primarily  for trunk    Transfers Overall transfer level: Needs assistance Equipment used: 1 person hand held assist Transfers: Bed to chair/wheelchair/BSC   Stand pivot transfers: Mod assist;Max assist         General transfer comment: arm in arm technique to SPS to/from [facility]      Balance Overall balance assessment: Needs assistance Sitting-balance support: Single extremity supported;Feet supported Sitting balance-Leahy Scale: Good     Standing balance support: Single extremity supported;During functional activity;Reliant on assistive device for balance Standing balance-Leahy Scale: Poor Standing balance comment: requuires MIN/MOD A to sustain static standing, at least MOD A while pivoting with arm in arm/use of gait belt, to/from commode                           ADL either performed or assessed with clinical judgement   ADL Overall ADL's : Needs assistance/impaired                                       General ADL Comments: SETUP to MIN A for seated UB ADLs. MAX A for seated LB ADLs. MOD/MAX A for SPS transfers with gait belt and arm in arm technique. MAX A +2 for peri care (MAX A to CTS and MAX A to perform actual ADL task).     Vision Patient Visual Report: No change from baseline       Perception     Praxis      Pertinent Vitals/Pain Pain Assessment: 0-10 Pain Score: 0-No pain Pain Location: L foot/ankle     Hand Dominance Right   Extremity/Trunk Assessment Upper Extremity Assessment Upper Extremity Assessment: LUE deficits/detail;RUE deficits/detail RUE Deficits / Details: WFL LUE Deficits / Details: L sided hemiplegia from previous CVA, contracted digits and some limited ability to shoulder shrug. Pt also with L side facial droop at baseline, but upon assessing, pt's spouse reports that it appears worse than her baseline. In addition, she is noted to have liquid run out of L corner of her mouth with sips of a drink which pt's spouse  states is not baseline.   Lower Extremity Assessment Lower Extremity Assessment: Defer to PT evaluation;Generalized weakness;LLE deficits/detail LLE Deficits / Details: L anke injury LLE: Unable to fully assess due to immobilization   Cervical / Trunk Assessment Cervical / Trunk Assessment: Normal   Communication Communication Communication: No difficulties   Cognition Arousal/Alertness: Awake/alert Behavior During Therapy: WFL for tasks assessed/performed Overall Cognitive Status: History of cognitive impairments - at baseline                                 General Comments: spouse endorses that pt's cognition has been somewhat off since stroke in July 2022. She presents this date basically oriented (x3-self, place, month), but seemingly flat with some tasks/somewhat difficult to cue or ascertain how well pt understood. She follows most basic commands, difficulty with higher level cognitive processing.     General Comments       Exercises Other Exercises Other Exercises: Ed with pt re: PLB rationale and technique to imrpove oxygenation. With light activity to transfer  to/from [facility], pt's sats drop to 74% on 4Lnc, OT increases to 6L to assist with improving saturation with PLB cues, but pt only increases to 82% with this method after ~2 minutes, turned up to 8Lnc and placed in pt's mouth with only progress to 85% after ~3 more minutes, at this time, OT was preparing to call RN, but respiratory therapy presents and provides breathign treatment while also leaving nasal cannula in nares. Pt finally able to advance saturations to 88% (after a collective ~8+ minutes, at no time was pt distressed). RT updated on pt's performance and saturations during light activity. All of this reported to RN including concerns for worsening L facial droop. Pt left sitting EOB with RT and RN assessing and her spouse present.   Shoulder Instructions      Home Living Family/patient expects to be  discharged to:: Private residence Living Arrangements: Spouse/significant other;Children (Husband and daughter who is special needs) Available Help at Discharge: Family;Available PRN/intermittently (husband (works) and [employer]) Type of Home: House Home Access: [place]: One level     Bathroom Shower/Tub: Teacher, early years/pre: Standard Bathroom Accessibility: Yes   Home Equipment: Tub bench;Wheelchair - manual;Cane - single point;Other (comment);Rolling Walker (2 wheels)          Prior Functioning/Environment Prior Level of Function : Needs assist;History of Falls (last six months)       Physical Assist : Mobility (physical);ADLs (physical) Mobility (physical): Transfers;Gait;Stairs ADLs (physical): Bathing;Dressing Mobility Comments: Husband reports pt needed mod to max A with all mobility, used hemi walker or RW for ambulation, 7 or 8 falls in the past month ADLs Comments: Husband reports pt needed min to mod A with all ADLs, stated they did stand pivot transfers to tub bench        OT Problem List: Decreased strength;Decreased activity tolerance;Impaired balance (sitting and/or standing);Decreased cognition;Decreased safety awareness;Decreased knowledge of use of DME or AE;Cardiopulmonary status limiting activity;Impaired tone;Impaired UE functional use      OT Treatment/Interventions: Self-care/ADL training;Therapeutic exercise;DME and/or AE instruction;Therapeutic activities;Patient/family education;Balance training    OT Goals(Current goals can be found in the care plan section) Acute Rehab OT Goals Patient Stated Goal: to have ankle heal and get better OT Goal Formulation: With patient/family Time For Goal Achievement: 12/07/21 Potential to Achieve Goals: Good ADL Goals Pt Will Transfer to Toilet: with min assist;stand pivot transfer;bedside commode Pt Will Perform Toileting - Clothing Manipulation and hygiene: with min  assist;sitting/lateral leans;with caregiver independent in assisting Pt/caregiver will Perform Home Exercise Program: Increased strength;Right Upper extremity;With Supervision  OT Frequency: Min 4X/week   Barriers to D/C:            Co-evaluation              AM-PAC OT "6 Clicks" Daily Activity     Outcome Measure Help from another person eating meals?: A Little Help from another person taking care of personal grooming?: A Little Help from another person toileting, which includes using toliet, bedpan, or urinal?: A Lot Help from another person bathing (including washing, rinsing, drying)?: A Lot Help from another person to put on and taking off regular upper body clothing?: A Lot Help from another person to put on and taking off regular lower body clothing?: A Lot 6 Click Score: 14   End of Session Equipment Utilized During Treatment: Gait belt;Rolling walker (2 wheels) Nurse Communication: Mobility status (OT status, concern for worsening L facial droop which RN  reports to MD)  Activity Tolerance: Patient tolerated treatment well Patient left: with family/visitor present;with call bell/phone within reach;Other (comment) (seated EOB wtih RN/RT assessing pt)  OT Visit Diagnosis: Unsteadiness on feet (R26.81);Muscle weakness (generalized) (M62.81);Other symptoms and signs involving the nervous system (R29.898);Hemiplegia and hemiparesis;Other abnormalities of gait and mobility (R26.89) Hemiplegia - Right/Left: Left Hemiplegia - caused by: Cerebral infarction                Time: 1459-1601 OT Time Calculation (min): 62 min Charges:  OT General Charges $OT Visit: 1 Visit OT Evaluation $OT Eval Moderate Complexity: 1 Mod OT Treatments $Self Care/Home Management : 23-37 mins $Therapeutic Activity: 8-22 mins  Gerrianne Scale, MS, OTR/L ascom [phone] 11/23/21, 7:41 PM

## 2021-11-23 NOTE — Progress Notes (Signed)
Patient ID: Erika [name], female   DOB: Sep 17, 1971, 50 y.o.   MRN: [id number]  Called by RN. Sats dropped to 72% when stood with therapy. Husband at bedside feels left facial droop is more. I do not see any change from am. Pt not in Respiratory distress Sats 94% on 14 L HFNC. HR 70  on portable pulse oximeter. Will try wean her down slowly to keep sats>90-92% Neuro wise remains the same. Husband would like MRI brain done. Ordered today

## 2021-11-23 NOTE — TOC Progression Note (Signed)
Transition of Care [facility]) - Progression Note    Patient Details  Name: Erika [name] MRN: [id number] Date of Birth: November 07, 1971  [facility]) CM/SW Contact  Shelbie Hutching, RN Phone Number: 11/23/2021, 1:40 PM  Clinical Narrative:    RNCM reached out to [facility], Danne Baxter, she will review patient.   Expected Discharge Plan: IP Rehab Facility Barriers to Discharge: Continued Medical Work up  Expected Discharge Plan and Services Expected Discharge Plan: [place]   Discharge Planning Services: CM Consult Post Acute Care Choice: IP Rehab Living arrangements for the past 2 months: Single Family Home                 DME Arranged: [name] DME Agency: NA       HH Arranged: NA HH Agency: NA         Social Determinants of Health (SDOH) Interventions    Readmission Risk Interventions Readmission Risk Prevention Plan 11/23/2021 04/28/2020  Transportation Screening Complete Complete  PCP or Specialist Appt within 5-7 Days - Complete  Home Care Screening - Complete  Medication Review (RN CM) - Complete  Medication Review [employer]) Complete -  PCP or Specialist appointment within 3-5 days of discharge Complete -  [place] or Home Care Consult Complete -  SW Recovery Care/Counseling Consult Complete -  Palliative Care Screening Not Applicable -  [place] Complete -  Some recent data might be hidden

## 2021-11-23 NOTE — Progress Notes (Addendum)
Physical Therapy Treatment Patient Details Name: Erika [name] MRN: [id number] DOB: [birth year] Today's Date: 11/23/2021   History of Present Illness Pt is a 50 y.o. Caucasian female with PMH significant for coronary artery disease, carotid artery disease, diastolic dysfunction, depression, PAD, clotting disorder, fatty liver disease, blood transfusion, myocardial infarction, multiple strokes left with L sided hemiplegia, and PTSD, who presented to the ER with acute onset of worsening cough over the last week and an accidental mechanical fall yesterday with subsequent left foot pain. MD assessment includes: community-acquired pneumonia with subsequent acute hypoxic respiratory failure and mild metabolic encephalopathy, avulsion fracture of the tip of the left medial malleolus, and possible UTI.   PT Comments    Pt was pleasant and agreeable to participate during the session and put forth good effort throughout. Pt was sitting EOB upon arrival with mother in law at beside. Pt required min to mod assist with STS due to LUE hemiplegia and LLE pain. Pt was only able to take lateral shuffle steps EOB and could not take steps forward. Pt was unable to make it to recliner with ambulation, so pt did a lateral scoot transfer from EOB to recliner with supervision. Pt remains highly motivated to return to PLOF as able, which was modified independent, ambulatory, and attending outpatient PT before this injury. Due to the recent decline in independent and function,recommend transition to acute inpatient rehab upon discharge for high-intensity, post-acute rehab services to maximize independence and mobility. Pt has very supportive family that is available for assistance following discharge.     Recommendations for follow up therapy are one component of a multi-disciplinary discharge planning process, led by the attending physician.  Recommendations may be updated based on patient status, additional functional  criteria and insurance authorization.  Follow Up Recommendations  Acute inpatient rehab (3hours/day)     Assistance Recommended at Discharge Frequent or constant Supervision/Assistance  Equipment Recommendations  None recommended by PT    Recommendations for Other Services  OT Consult     Precautions / Restrictions Precautions Precautions: Fall Restrictions Weight Bearing Restrictions: Yes LLE Weight Bearing: Weight bearing as tolerated (with aircast or CAM boot donned)     Mobility  Bed Mobility               General bed mobility comments: NT, Pt sitting EOB upon entry    Transfers Overall transfer level: Needs assistance Equipment used: Hemi-walker Transfers: Sit to/from Stand;Bed to chair/wheelchair/BSC Sit to Stand: Mod assist;Min assist          Lateral/Scoot Transfers: Supervision General transfer comment: Increased time and effort, mod assist due to LLE injury and LUE hemiplegia, supervision with scoot transfer to recliner    Ambulation/Gait Ambulation/Gait assistance: Mod assist Gait Distance (Feet): 2 Feet Assistive device: Hemi-walker Gait Pattern/deviations: Step-to pattern;Decreased weight shift to left;Decreased step length - right;Decreased step length - left;Shuffle Gait velocity: decreased     General Gait Details: Mod assist due to LLE injury and LUE hemiplegia, only could take lateral shuffle steps at EOB and unable to take steps forward, scoot transfer from EOB to recliner   Stairs             Wheelchair Mobility    Modified Rankin (Stroke Patients Only)       Balance Overall balance assessment: Needs assistance Sitting-balance support: Single extremity supported;Feet supported Sitting balance-Leahy Scale: Good     Standing balance support: Single extremity supported;During functional activity;Reliant on assistive device for balance Standing balance-Leahy Scale: [place] Standing  balance comment: Hardly able to WB through  LLE due to pain, reliant on hemi walker for balance                            Cognition Arousal/Alertness: Awake/alert Behavior During Therapy: WFL for tasks assessed/performed Overall Cognitive Status: Within Functional Limits for tasks assessed                                          Exercises Other Exercises Other Exercises: Reviewed HEP for pt to do every hour    General Comments        Pertinent Vitals/Pain Pain Assessment: 0-10 Pain Score: 0-No pain    Home Living                          Prior Function            PT Goals (current goals can now be found in the care plan section) Acute Rehab PT Goals Patient Stated Goal: to get better PT Goal Formulation: With patient Time For Goal Achievement: 12/05/21 Potential to Achieve Goals: Fair Progress towards PT goals: Progressing toward goals    Frequency    Min 2X/week      PT Plan Discharge plan needs to be updated    Co-evaluation              AM-PAC PT "6 Clicks" Mobility   Outcome Measure  Help needed turning from your back to your side while in a flat bed without using bedrails?: A Little Help needed moving from lying on your back to sitting on the side of a flat bed without using bedrails?: A Little Help needed moving to and from a bed to a chair (including a wheelchair)?: A Lot Help needed standing up from a chair using your arms (e.g., wheelchair or bedside chair)?: A Little Help needed to walk in hospital room?: A Lot Help needed climbing 3-5 steps with a railing? : Total 6 Click Score: 14    End of Session Equipment Utilized During Treatment: Gait belt;Oxygen Activity Tolerance: Patient limited by pain Patient left: in chair;with call bell/phone within reach;with chair alarm set;with family/visitor present (Mother in law) Nurse Communication: Mobility status PT Visit Diagnosis: Unsteadiness on feet (R26.81);Muscle weakness (generalized)  (M62.81);History of falling (Z91.81);Difficulty in walking, not elsewhere classified (R26.2);Hemiplegia and hemiparesis;Pain Hemiplegia - Right/Left: Left Hemiplegia - dominant/non-dominant: Non-dominant Hemiplegia - caused by: Cerebral infarction Pain - Right/Left: Left Pain - part of body: Ankle and joints of foot     Time: 1121-1141 PT Time Calculation (min) (ACUTE ONLY): 20 min  Charges:                        Sheldon Silvan SPT 11/23/21, 1:09 PM

## 2021-11-23 NOTE — Progress Notes (Signed)
[place] [facility] [place] NAME: Erika [name]    MR#:  [id number]  DATE OF BIRTH:  07-15-1971  SUBJECTIVE:   mother at bedside patient came in after she had a mechanical fall while trying to take steps getting up from the toilet to her chair. She developed a left medial malleolus  fracture. Patient also has been having coughing spell. Found to have pneumonia. Currently afebrile. No respiratory distress. Eating well Worked with PT yday REVIEW OF SYSTEMS:   Review of Systems  Constitutional:  Negative for chills, fever and weight loss.  HENT:  Negative for ear discharge, ear pain and nosebleeds.   Eyes:  Negative for blurred vision, pain and discharge.  Respiratory:  Positive for cough, sputum production and shortness of breath. Negative for wheezing and stridor.   Cardiovascular:  Negative for chest pain, palpitations, orthopnea and PND.  Gastrointestinal:  Negative for abdominal pain, diarrhea, nausea and vomiting.  Genitourinary:  Negative for frequency and urgency.  Musculoskeletal:  Negative for back pain and joint pain.  Neurological:  Positive for focal weakness and weakness. Negative for sensory change and speech change.  Psychiatric/Behavioral:  Negative for depression and hallucinations. The patient is not nervous/anxious.   Tolerating Diet yes Tolerating PT: SNF  DRUG ALLERGIES:   Allergies  Allergen Reactions   Chantix [Varenicline Tartrate] Other (See Comments)    Feels "crazy" and angry     VITALS:  Blood pressure (!) 158/77, pulse 78, temperature 97.9 F (36.6 C), temperature source Oral, resp. rate 18, height 5\' 7"  (1.702 m), weight 98.9 kg, SpO2 97 %.  PHYSICAL EXAMINATION:   Physical Exam  GENERAL:  50 y.o.-year-old patient lying in the bed with no acute distress. obese HEENT: Head atraumatic, normocephalic. Oropharynx and nasopharynx clear.  LUNGS: decreased breath sounds bilaterally, no wheezing, rales,  rhonchi. No use of accessory muscles of respiration.  CARDIOVASCULAR: S1, S2 normal. No murmurs, rubs, or gallops.  ABDOMEN: Soft, nontender, nondistended. Bowel sounds present. No organomegaly or mass.  EXTREMITIES: No cyanosis, clubbing or edema b/l.   Left lower extremity Aircast. NEUROLOGIC: chronic left upper extremity hemiparesis due to previous stroke. PSYCHIATRIC:  patient is alert and oriented x 3.  SKIN: No obvious rash, lesion, or ulcer.   LABORATORY PANEL:  CBC Recent Labs  Lab 11/22/21 0650  WBC 16.6*  HGB 11.3*  HCT 34.7*  PLT 302     Chemistries  Recent Labs  Lab 11/22/21 0650  NA 134*  K 3.5  CL 103  CO2 25  GLUCOSE 124*  BUN 12  CREATININE 0.73  CALCIUM 9.2    Cardiac Enzymes No results for input(s): TROPONINI in the last 168 hours. RADIOLOGY:  DG Tibia/Fibula Left  Result Date: 11/21/2021 CLINICAL DATA:  Ankle fracture EXAM: LEFT TIBIA AND FIBULA - 2 VIEW COMPARISON:  None. FINDINGS: There is no evidence of fracture or other focal bone lesions. Soft tissues are unremarkable. IMPRESSION: No acute fracture identified in the tibia or fibula. Electronically Signed   By: Ofilia Neas M.D.   On: 11/21/2021 19:15   DG Ankle Complete Left  Result Date: 11/21/2021 CLINICAL DATA:  LEFT ankle and knee pain EXAM: LEFT ANKLE COMPLETE - 3+ VIEW COMPARISON:  None. FINDINGS: Small avulsion fragment fragments inferior to the medial malleolus. Ankle mortise intact. Talar dome is normal. No joint effusion. IMPRESSION: Age-indeterminate avulsion fracture from the tip of the medial malleolus. Electronically Signed   By: Helane Gunther.D.  On: 11/21/2021 13:21   DG Chest Portable 1 View  Result Date: 11/21/2021 CLINICAL DATA:  Shortness of breath EXAM: PORTABLE CHEST 1 VIEW COMPARISON:  09/08/2021 FINDINGS: Patchy opacities at the left greater than right lung bases. No pleural effusion. Stable cardiomediastinal contours. IMPRESSION: Patchy opacities at the left  greater than right lung bases suspicious for pneumonia. Electronically Signed   By: Macy Mis M.D.   On: 11/21/2021 14:46   DG Knee Complete 4 Views Left  Result Date: 11/21/2021 CLINICAL DATA:  Left knee pain after fall. EXAM: LEFT KNEE - COMPLETE 4+ VIEW COMPARISON:  None. FINDINGS: No evidence of fracture, dislocation, or joint effusion. No evidence of arthropathy or other focal bone abnormality. Soft tissues are unremarkable. IMPRESSION: Negative. Electronically Signed   By: Marijo Conception M.D.   On: 11/21/2021 13:20   ASSESSMENT AND PLAN:   Erika [name] is a 50 y.o. Caucasian female with medical history significant for coronary artery disease, carotid artery disease, diastolic dysfunction, depression, PAD and PTSD, who presented to the ER with acute onset of worsening cough over the last week and this area got significantly worse since yesterday She had an accidental mechanical fall yesterday with subsequent left foot pain   Chest x-ray showed patchy opacities of the left greater than the right lung bases suspicious for pneumonia.   Left ankle x-ray showed age-indeterminate avulsion fracture from the tip of the medial malleolus.   Left knee x-ray was negative.  Acute hypoxic respiratory failure secondary to community acquired pneumonia -- patient came in with increasing cough shortness of breath and briefly dropped her sats 85% on room air. Currently on 4 L nasal cannula oxygen was sats 94% -- IV Levaquin -- chest x-ray positive for pneumonia -- blood culture negative -- Pro calcitonin 19.2--recheck in am -- white count trending 19K--16K--CBC in am  Avulsion fracture of medial malleolus left foot status post mechanical fall at home -- PRN pain meds -- seen by Dr Roland Rack-- recommends Air cast and weight-bearing as tolerated with PT --PT rec rehab  history of CVA with chronic left upper extremity paresis -- continue aspirin Plavix -- continue statins  type II diabetes  with hyperglycemia -- continue insulin and sliding scale  coronary artery disease --continue Imdur, prn  sublingual nitroglycerin, statin therapy, Plavix and Coreg.  Procedures: Family communication : mother at bedside Consults : orthopedic CODE STATUS: full DVT Prophylaxis : enoxaparin Level of care: Telemetry Medical Status is: Inpatient  Remains inpatient appropriate because: Pneumonia and fracture TOC for d/c planning to rehab        TOTAL TIME TAKING CARE OF THIS PATIENT: 25 minutes.  >50% time spent on counselling and coordination of care  Note: This dictation was prepared with Dragon dictation along with smaller phrase technology. Any transcriptional errors that result from this process are unintentional.  Fritzi Mandes M.D    [facility]   CC: Primary care physician; Lesleigh Noe, MD Patient ID: Erika [name], female   DOB: [birth year], 50 y.o.   MRN: [id number]

## 2021-11-23 NOTE — Progress Notes (Signed)
? ?  Inpatient Rehab Admissions Coordinator : ? ?Per therapy recommendations, patient was screened for CIR candidacy by Ryli Standlee RN MSN.  At this time patient appears to be a potential candidate for CIR. I will place a rehab consult per protocol for full assessment. Please call me with any questions. ? ?Amandamarie Feggins RN MSN ?Admissions Coordinator ?[phone] ?  ?

## 2021-11-23 NOTE — Progress Notes (Signed)
Called to pt room by OT. Pts husband stated he was concerned that pt had new L sided facial droop and he noticed she was dribbling water out of the L side of her mouth when she was drinking. Pt reassessed by me. MD notified.

## 2021-11-24 LAB — GLUCOSE, CAPILLARY
Glucose-Capillary: 180 mg/dL — ABNORMAL HIGH (ref 70–99)
Glucose-Capillary: 144 mg/dL — ABNORMAL HIGH (ref 70–99)
Glucose-Capillary: 140 mg/dL — ABNORMAL HIGH (ref 70–99)
Glucose-Capillary: 126 mg/dL — ABNORMAL HIGH (ref 70–99)

## 2021-11-24 LAB — PROCALCITONIN: Procalcitonin: 6.74 ng/mL

## 2021-11-24 LAB — CBC
HCT: 37.3 % (ref 36.0–46.0)
Hemoglobin: 12 g/dL (ref 12.0–15.0)
MCH: 27.1 pg (ref 26.0–34.0)
MCHC: 32.2 g/dL (ref 30.0–36.0)
MCV: 84.4 fL (ref 80.0–100.0)
Platelets: 360 10*3/uL (ref 150–400)
RBC: 4.42 MIL/uL (ref 3.87–5.11)
RDW: 13.4 % (ref 11.5–15.5)
WBC: 9 10*3/uL (ref 4.0–10.5)
nRBC: 0 % (ref 0.0–0.2)

## 2021-11-24 MED ORDER — FUROSEMIDE 10 MG/ML IJ SOLN
40.0000 mg | Freq: Once | INTRAMUSCULAR | Status: AC
  Administered 2021-11-24: 09:00:00 40 mg via INTRAVENOUS
  Filled 2021-11-24: qty 4

## 2021-11-24 MED ORDER — IBUPROFEN 400 MG PO TABS
400.0000 mg | ORAL_TABLET | Freq: Four times a day (QID) | ORAL | Status: DC | PRN
  Administered 2021-11-24: 400 mg via ORAL
  Filled 2021-11-24: qty 1

## 2021-11-24 MED ORDER — TORSEMIDE 20 MG PO TABS
10.0000 mg | ORAL_TABLET | Freq: Every day | ORAL | Status: DC
  Administered 2021-11-25 – 2021-11-27 (×3): 10 mg via ORAL
  Filled 2021-11-24 (×3): qty 1

## 2021-11-24 NOTE — Progress Notes (Signed)
Inpatient Rehab Admissions Coordinator:   Spoke to pt's spouse over the phone regarding CIR recommendations.  They have been on our unit in the past and are familiar with goals and expectations.  Per spouse, pt has been declining over the last few weeks with progressive weakness and falls, one causing her recent ankle fracture.  He states that she has struggled with frequent UTIs, and increasing delirium at home with changes noted on her MRI compared to 3 months ago.  At discharge, pt would be able to have support from him, and when he is working, his daughter and mother can provide support as long as patient has improved in her functional mobility.  I believe that once respiratory status has stabilized, she would make excellent progress on CIR and be able to discharge home with family support.  I will start insurance auth request today.   Shann Medal, PT, DPT Admissions Coordinator [phone] 11/24/21  11:35 AM

## 2021-11-24 NOTE — Progress Notes (Signed)
[facility] [place] NAME: Erika [name]    MR#:  [id number]  DATE OF BIRTH:  23-Feb-1971  SUBJECTIVE:   patient eating breakfast earlier. Husband at bedside. No respiratory distress. Oxygen saturations more than 92%. Will try to wean oxygen down to nasal cannula. Discussed with patient and hospital labs and MRI. REVIEW OF SYSTEMS:   Review of Systems  Constitutional:  Negative for chills, fever and weight loss.  HENT:  Negative for ear discharge, ear pain and nosebleeds.   Eyes:  Negative for blurred vision, pain and discharge.  Respiratory:  Negative for wheezing and stridor.   Cardiovascular:  Negative for chest pain, palpitations, orthopnea and PND.  Gastrointestinal:  Negative for abdominal pain, diarrhea, nausea and vomiting.  Genitourinary:  Negative for frequency and urgency.  Musculoskeletal:  Negative for back pain and joint pain.  Neurological:  Positive for focal weakness and weakness. Negative for sensory change and speech change.  Psychiatric/Behavioral:  Negative for depression and hallucinations. The patient is not nervous/anxious.   Tolerating Diet yes Tolerating PT: SNF  DRUG ALLERGIES:   Allergies  Allergen Reactions   Chantix [Varenicline Tartrate] Other (See Comments)    Feels "crazy" and angry     VITALS:  Blood pressure (!) 155/69, pulse 87, temperature 98.3 F (36.8 C), temperature source Oral, resp. rate 16, height 5\' 7"  (1.702 m), weight 98.9 kg, SpO2 92 %.  PHYSICAL EXAMINATION:   Physical Exam  GENERAL:  50 y.o.-year-old patient lying in the bed with no acute distress. obese HEENT: Head atraumatic, normocephalic. Oropharynx and nasopharynx clear.  LUNGS: decreased breath sounds bilaterally, no wheezing, rales, rhonchi. No use of accessory muscles of respiration.  CARDIOVASCULAR: S1, S2 normal. No murmurs, rubs, or gallops.  ABDOMEN: Soft, nontender, nondistended. Bowel sounds present. No  organomegaly or mass.  EXTREMITIES: No cyanosis, clubbing or edema b/l.   Left lower extremity Aircast. NEUROLOGIC: chronic left upper extremity hemiparesis due to previous stroke. PSYCHIATRIC:  patient is alert and oriented x 3.  SKIN: No obvious rash, lesion, or ulcer.   LABORATORY PANEL:  CBC Recent Labs  Lab 11/24/21 0448  WBC 9.0  HGB 12.0  HCT 37.3  PLT 360     Chemistries  Recent Labs  Lab 11/22/21 0650  NA 134*  K 3.5  CL 103  CO2 25  GLUCOSE 124*  BUN 12  CREATININE 0.73  CALCIUM 9.2    Cardiac Enzymes No results for input(s): TROPONINI in the last 168 hours. RADIOLOGY:  MR BRAIN WO CONTRAST  Result Date: 11/23/2021 CLINICAL DATA:  Transient ischemic attack EXAM: MRI HEAD WITHOUT CONTRAST TECHNIQUE: Multiplanar, multiecho pulse sequences of the brain and surrounding structures were obtained without intravenous contrast. COMPARISON:  06/27/2021 FINDINGS: Brain: No acute infarct, mass effect or extra-axial collection. No acute or chronic hemorrhage. Old right occipital and parietal cortical infarcts. Otherwise mild chronic small vessel disease. There is advanced chronic ischemic change within the brainstem and extending into the cerebellar peduncles. Mild generalized volume loss. The midline structures are normal. Vascular: Major flow voids are preserved. Skull and upper cervical spine: Normal calvarium and skull base. Visualized upper cervical spine and soft tissues are normal. Sinuses/Orbits:No paranasal sinus fluid levels or advanced mucosal thickening. No mastoid or middle ear effusion. Normal orbits. IMPRESSION: 1. No acute intracranial abnormality. 2. Old right occipital and parietal cortical infarcts. 3. Advanced chronic ischemic change within the brainstem and cerebellar peduncles. Electronically Signed   By: Ulyses Jarred  M.D.   On: 11/23/2021 23:42   ASSESSMENT AND PLAN:   JAIDE [name] is a 50 y.o. Caucasian female with medical history significant for  coronary artery disease, carotid artery disease, diastolic dysfunction, depression, PAD and PTSD, who presented to the ER with acute onset of worsening cough over the last week and this area got significantly worse since yesterday She had an accidental mechanical fall yesterday with subsequent left foot pain  Chest x-ray showed patchy opacities of the left greater than the right lung bases suspicious for pneumonia.   Left ankle x-ray showed age-indeterminate avulsion fracture from the tip of the medial malleolus.   Left knee x-ray was negative.  Acute hypoxic respiratory failure secondary to community acquired pneumonia -- patient came in with increasing cough shortness of breath and briefly dropped her sats 85% on room air. Currently on 4 L nasal cannula oxygen was sats 94% -- IV Levaquin -- chest x-ray positive for pneumonia -- blood culture negative -- Pro calcitonin 19.2--recheck in am -- white count trending 19K--16K--9.0 --no fever --wean oxygen down to RA if able to keep sats ?92%  Avulsion fracture of medial malleolus left foot status post mechanical fall at home -- PRN pain meds -- seen by Dr Roland Rack-- recommends Air cast and weight-bearing as tolerated with PT --PT rec rehab  history of CVA with chronic left upper extremity paresis -- continue aspirin Plavix -- continue statins --12/1--MRI brain done per husbands request--no new CVA  type II diabetes with hyperglycemia -- continue insulin and sliding scale  coronary artery disease --continue Imdur, prn  sublingual nitroglycerin, statin therapy, Plavix and Coreg.  Procedures: Family communication : husband at bedside Consults : orthopedic CODE STATUS: full DVT Prophylaxis : enoxaparin Level of care: Telemetry Medical Status is: Inpatient  Remains inpatient appropriate because: Pneumonia and fracture TOC for d/c planning to rehab CIR looking into the case also        TOTAL TIME TAKING CARE OF THIS PATIENT: 25  minutes.  >50% time spent on counselling and coordination of care  Note: This dictation was prepared with Dragon dictation along with smaller phrase technology. Any transcriptional errors that result from this process are unintentional.  Fritzi Mandes M.D    [facility]   CC: Primary care physician; Lesleigh Noe, MD Patient ID: Andrez Grime, female   DOB: [birth year], 50 y.o.   MRN: [id number]

## 2021-11-24 NOTE — TOC Progression Note (Signed)
Transition of Care [facility]) - Progression Note    Patient Details  Name: Erika [name] MRN: [id number] Date of Birth: January 14, 1971  [facility] CM/SW Contact  Shelbie Hutching, RN Phone Number: 11/24/2021, 3:12 PM  Clinical Narrative:     Acute inpatient rehab at [facility] is starting insurance auth today.  Patient will remain in the hospital over the weekend and CIR should have beds available next Tuesday or Wed. As long at insurance authorizes.   Expected Discharge Plan: IP Rehab Facility Barriers to Discharge: Continued Medical Work up  Expected Discharge Plan and Services Expected Discharge Plan: [place]   Discharge Planning Services: CM Consult Post Acute Care Choice: IP Rehab Living arrangements for the past 2 months: Single Family Home                 DME Arranged: [name] DME Agency: NA       HH Arranged: NA HH Agency: NA         Social Determinants of Health (SDOH) Interventions    Readmission Risk Interventions Readmission Risk Prevention Plan 11/23/2021 04/28/2020  Transportation Screening Complete Complete  PCP or Specialist Appt within 5-7 Days - Complete  Home Care Screening - Complete  Medication Review (RN CM) - Complete  Medication Review [employer] photographer) Complete -  PCP or Specialist appointment within 3-5 days of discharge Complete -  [place] or Home Care Consult Complete -  SW Recovery Care/Counseling Consult Complete -  Palliative Care Screening Not Applicable -  [place] Complete -  Some recent data might be hidden

## 2021-11-24 NOTE — Progress Notes (Signed)
Physical Therapy Treatment Patient Details Name: Erika [name] MRN: [id number] DOB: [birth year] Today's Date: 11/24/2021   History of Present Illness Pt is a 50 y.o. Caucasian female with PMH significant for coronary artery disease, carotid artery disease, diastolic dysfunction, depression, PAD, clotting disorder, fatty liver disease, blood transfusion, myocardial infarction, multiple strokes left with L sided hemiplegia, and PTSD, who presented to the ER with acute onset of worsening cough over the last week and an accidental mechanical fall yesterday with subsequent left foot pain. MD assessment includes: community-acquired pneumonia with subsequent acute hypoxic respiratory failure and mild metabolic encephalopathy, avulsion fracture of the tip of the left medial malleolus, and possible UTI.   PT Comments    Pt was pleasant and agreeable to participate during the session and put forth good effort throughout. Pt was able to complete all ther ex in bed and EOB. Pt required min assist for trunk control with bed mobility and did not experience any adverse symptoms. Pt demonstrated good sitting balance at EOB even with resistance on RLE LAQs. Pt required mod assist +2 with STS using hemi walker and +1 HHA. Pt was unable to maintain standing balance without +2 assist due to LLE pain. Pt was not able to take steps forward or laterally due to pain and weakness. Pt completed lateral scoot transfer to recliner from EOB with supervision, and showed improvement from previous session. SpO2 and HR remained WNL throughout the session. Pt will benefit from CIR upon discharge to safely address deficits listed in patient problem list for decreased caregiver assistance and eventual return to PLOF.    Recommendations for follow up therapy are one component of a multi-disciplinary discharge planning process, led by the attending physician.  Recommendations may be updated based on patient status, additional functional  criteria and insurance authorization.  Follow Up Recommendations  Acute inpatient rehab (3hours/day)     Assistance Recommended at Discharge Frequent or constant Supervision/Assistance  Equipment Recommendations  None recommended by PT    Recommendations for Other Services       Precautions / Restrictions Precautions Precautions: Fall Restrictions Weight Bearing Restrictions: Yes LLE Weight Bearing: Weight bearing as tolerated     Mobility  Bed Mobility Overal bed mobility: Needs Assistance Bed Mobility: Supine to Sit     Supine to sit: Min assist;HOB elevated     General bed mobility comments: increased time and effort, use of bed rails, assist primarily for trunk control    Transfers Overall transfer level: Needs assistance Equipment used: Hemi-walker;1 person hand held assist Transfers: Bed to chair/wheelchair/BSC;Sit to/from Stand Sit to Stand: +2 physical assistance;Mod assist          Lateral/Scoot Transfers: Supervision General transfer comment: Mod assist with STS +2 (hemi-walker and HHA), supervision for lateral scoot and showed imrpovement from previous sessions    Ambulation/Gait Ambulation/Gait assistance: Mod assist;+2 physical assistance Gait Distance (Feet): 3 Feet Assistive device: Hemi-walker;1 person hand held assist Gait Pattern/deviations: Step-to pattern;Shuffle;Decreased step length - right;Decreased step length - left;Decreased stride length Gait velocity: decreased     General Gait Details: Mod assist due to LLE pain and weakness, unable to take lateral shuffle steps at EOB, scoot transfer from EOB to recliner, mod cuing for foot placement   Stairs             Wheelchair Mobility    Modified Rankin (Stroke Patients Only)       Balance Overall balance assessment: Needs assistance Sitting-balance support: Single extremity supported;Feet supported (due to LUE  hemiplegia) Sitting balance-Leahy Scale: Good     Standing  balance support: Single extremity supported;During functional activity;Reliant on assistive device for balance Standing balance-Leahy Scale: Poor Standing balance comment: requires min to mod A to sustain static standing with hemi walker and +1 HHA                            Cognition Arousal/Alertness: Awake/alert Behavior During Therapy: WFL for tasks assessed/performed Overall Cognitive Status: History of cognitive impairments - at baseline                                          Exercises Total Joint Exercises Ankle Circles/Pumps: AROM;Right;10 reps;Supine Quad Sets: AROM;Both;10 reps;Supine Gluteal Sets: AROM;Both;10 reps;Supine Long Arc Quad: AROM;Strengthening;Both;10 reps;Seated (resistance applied to RLE)    General Comments        Pertinent Vitals/Pain Pain Assessment: 0-10 Pain Score: 8  Pain Location: L foot/ankle Pain Descriptors / Indicators: Aching;Discomfort;Sore Pain Intervention(s): Repositioned;Patient requesting pain meds-RN notified    Home Living                          Prior Function            PT Goals (current goals can now be found in the care plan section) Acute Rehab PT Goals Patient Stated Goal: to get better PT Goal Formulation: With patient Time For Goal Achievement: 12/05/21 Potential to Achieve Goals: Fair Progress towards PT goals: Progressing toward goals    Frequency    Min 2X/week      PT Plan Current plan remains appropriate    Co-evaluation              AM-PAC PT "6 Clicks" Mobility   Outcome Measure  Help needed turning from your back to your side while in a flat bed without using bedrails?: A Little Help needed moving from lying on your back to sitting on the side of a flat bed without using bedrails?: A Little Help needed moving to and from a bed to a chair (including a wheelchair)?: A Lot Help needed standing up from a chair using your arms (e.g., wheelchair or  bedside chair)?: A Little Help needed to walk in hospital room?: A Lot Help needed climbing 3-5 steps with a railing? : Total 6 Click Score: 14    End of Session Equipment Utilized During Treatment: Gait belt;Oxygen Activity Tolerance: Patient limited by pain Patient left: in chair;with call bell/phone within reach;with chair alarm set;Other (comment) (with BLE elevated) Nurse Communication: Mobility status;Patient requests pain meds PT Visit Diagnosis: Unsteadiness on feet (R26.81);Muscle weakness (generalized) (M62.81);History of falling (Z91.81);Difficulty in walking, not elsewhere classified (R26.2);Hemiplegia and hemiparesis;Pain Hemiplegia - Right/Left: Left Hemiplegia - dominant/non-dominant: Non-dominant Hemiplegia - caused by: Cerebral infarction Pain - Right/Left: Left Pain - part of body: Ankle and joints of foot     Time: 1410-1437 PT Time Calculation (min) (ACUTE ONLY): 27 min  Charges:                        Sheldon Silvan SPT 11/24/21, 5:29 PM

## 2021-11-24 NOTE — Progress Notes (Signed)
Inpatient Rehab Admissions Coordinator:   I received insurance approval for CIR.  I will not have a bed for this patient over the weekend, and will f/u on Monday.   Shann Medal, PT, DPT Admissions Coordinator [phone] 11/24/21  4:29 PM

## 2021-11-24 NOTE — Progress Notes (Signed)
Occupational Therapy Treatment Patient Details Name: Erika [name] MRN: [id number] DOB: 1971/04/20 Today's Date: 11/24/2021   History of present illness Pt is a 50 y.o. Caucasian female with PMH significant for coronary artery disease, carotid artery disease, diastolic dysfunction, depression, PAD, clotting disorder, fatty liver disease, blood transfusion, myocardial infarction, multiple strokes left with L sided hemiplegia, and PTSD, who presented to the ER with acute onset of worsening cough over the last week and an accidental mechanical fall yesterday with subsequent left foot pain. MD assessment includes: community-acquired pneumonia with subsequent acute hypoxic respiratory failure and mild metabolic encephalopathy, avulsion fracture of the tip of the left medial malleolus, and possible UTI.   OT comments  Erika [name] seen for OT treatment on this date. Upon arrival to room pt awake/alert and seated upright in chair. Pt reporting 10/10 pain. Pt agreeable to tx.  Pt required MOD A + hemi walker for functional mobility, STS 5X. 1 LOB noted with weight shifting. MOD A for 3 lateral steps to right, 2 lateral steps to left. During tx nurse removed [place], pt on RA, no SOB noted. Pt making good progress toward goals. Pt continues to benefit from skilled OT services to maximize return to PLOF and minimize risk of future falls, injury, caregiver burden, and readmission. Will continue to follow POC. Discharge recommendation remains appropriate.     Recommendations for follow up therapy are one component of a multi-disciplinary discharge planning process, led by the attending physician.  Recommendations may be updated based on patient status, additional functional criteria and insurance authorization.    Follow Up Recommendations  Acute inpatient rehab (3hours/day)    Assistance Recommended at Discharge Frequent or constant Supervision/Assistance  Equipment Recommendations  Other (comment) (defer  to next venue of care)    Recommendations for Other Services      Precautions / Restrictions Precautions Precautions: Fall Restrictions Weight Bearing Restrictions: Yes LLE Weight Bearing: Weight bearing as tolerated (with aircast or CAM boot donned)       Mobility Bed Mobility          General bed mobility comments: Pt received/left sitting in chair.    Transfers Overall transfer level: Needs assistance Equipment used: Hemi-walker;1 person hand held assist Transfers: Sit to/from Stand Sit to Stand: Mod assist                Balance Overall balance assessment: Needs assistance Sitting-balance support: No upper extremity supported;Feet supported Sitting balance-Leahy Scale: Good     Standing balance support: Single extremity supported;During functional activity;Reliant on assistive device for balance Standing balance-Leahy Scale: Poor Standing balance comment: Increase to MOD A for static standing, LOB noted with weight shifting onto left leg                           ADL either performed or assessed with clinical judgement   ADL Overall ADL's : Needs assistance/impaired                                       General ADL Comments: MOD A + hemi walker for functional mobility, STS 5X.    Extremity/Trunk Assessment               Cognition Arousal/Alertness: Awake/alert Behavior During Therapy: WFL for tasks assessed/performed Overall Cognitive Status: History of cognitive impairments - at baseline  Exercises Exercises: Other exercises Total Joint Exercises  Other Exercises Other Exercises: Pt educ re: OT role, Other Exercises: sit<>stand x 5, weight shifting, 3 lateral steps to right, 2 lateral steps to left   Shoulder Instructions       General Comments During tx nurse removed [place], Pt on RA, no SOB noted    Pertinent Vitals/ Pain       Pain Assessment:  0-10 Pain Score: 10-Worst pain ever Pain Location: L foot/ankle Pain Descriptors / Indicators: Aching;Discomfort;Sore Pain Intervention(s): Limited activity within patient's tolerance;RN gave pain meds during session;Monitored during session;Repositioned  Home Living                                          Prior Functioning/Environment              Frequency  Min 4X/week        Progress Toward Goals  OT Goals(current goals can now be found in the care plan section)  Progress towards OT goals: Progressing toward goals  Acute Rehab OT Goals Patient Stated Goal: to get better OT Goal Formulation: With patient Time For Goal Achievement: 12/07/21 Potential to Achieve Goals: Good ADL Goals Pt Will Transfer to Toilet: with min assist;stand pivot transfer;bedside commode Pt Will Perform Toileting - Clothing Manipulation and hygiene: with min assist;sitting/lateral leans;with caregiver independent in assisting Pt/caregiver will Perform Home Exercise Program: Increased strength;Right Upper extremity;With Supervision  Plan Discharge plan remains appropriate;Frequency remains appropriate    Co-evaluation                 AM-PAC OT "6 Clicks" Daily Activity     Outcome Measure   Help from another person eating meals?: A Little Help from another person taking care of personal grooming?: A Little Help from another person toileting, which includes using toliet, bedpan, or urinal?: A Lot Help from another person bathing (including washing, rinsing, drying)?: A Lot Help from another person to put on and taking off regular upper body clothing?: A Lot Help from another person to put on and taking off regular lower body clothing?: A Lot 6 Click Score: 14    End of Session Equipment Utilized During Treatment: Gait belt;Other (comment) (hemiwalker)  OT Visit Diagnosis: Unsteadiness on feet (R26.81);Muscle weakness (generalized) (M62.81);Hemiplegia and  hemiparesis;Other abnormalities of gait and mobility (R26.89) Hemiplegia - Right/Left: Left   Activity Tolerance Patient tolerated treatment well   Patient Left in chair;Other (comment);with family/visitor present (Nurse notified that chair alarm was not on)   Nurse Communication Mobility status;Other (comment) (Nurse in room during tx)        Time: 3893-7342 OT Time Calculation (min): 26 min  Charges: OT General Charges $OT Visit: 1 Visit OT Treatments $Therapeutic Exercise: 23-37 mins  Nino Glow, Markus Daft 11/24/2021, 4:17 PM

## 2021-11-24 NOTE — Progress Notes (Signed)
Patient noted to have attention-seeking behaviors throughout shift. Patient mod assist with transfer from bed to [facility] when just RN and NT in room. Patient required much more assistance with PT and requests to be essentially lifted by her husband during transfers when he is in the room.  When behavior is verbalized by RN, patient agrees that she does "worse" when husband is around.  Patient will frequently tell her family she has called out for staff assistance multiple times without actually having done so. Twice during shift patient stated she had to "pee" as soon as her husband left the room, but refused to allow staff to assist her to [facility] stating "I'll just wait until he comes back." Patient frequently encouraged to make use of staff assistance.

## 2021-11-25 LAB — GLUCOSE, CAPILLARY
Glucose-Capillary: 92 mg/dL (ref 70–99)
Glucose-Capillary: 116 mg/dL — ABNORMAL HIGH (ref 70–99)
Glucose-Capillary: 107 mg/dL — ABNORMAL HIGH (ref 70–99)
Glucose-Capillary: 99 mg/dL (ref 70–99)

## 2021-11-25 MED ORDER — POLYETHYLENE GLYCOL 3350 17 G PO PACK
17.0000 g | PACK | Freq: Two times a day (BID) | ORAL | Status: DC
  Filled 2021-11-25 (×2): qty 1

## 2021-11-25 MED ORDER — HYDRALAZINE HCL 20 MG/ML IJ SOLN
5.0000 mg | INTRAMUSCULAR | Status: DC | PRN
  Administered 2021-11-26: 5 mg via INTRAVENOUS
  Filled 2021-11-25: qty 1

## 2021-11-25 MED ORDER — LEVOFLOXACIN 750 MG PO TABS
750.0000 mg | ORAL_TABLET | Freq: Every day | ORAL | Status: AC
  Administered 2021-11-25 – 2021-11-26 (×2): 750 mg via ORAL
  Filled 2021-11-25 (×2): qty 1

## 2021-11-25 MED ORDER — IBUPROFEN 400 MG PO TABS: 400.0000 mg | ORAL_TABLET | Freq: Three times a day (TID) | ORAL | Status: DC | PRN

## 2021-11-25 NOTE — Plan of Care (Signed)
  Problem: Coping: Goal: Level of anxiety will decrease Outcome: Progressing   Problem: Elimination: Goal: Will not experience complications related to bowel motility Outcome: Progressing   

## 2021-11-25 NOTE — Progress Notes (Signed)
[facility] [place] NAME: Erika [name]    MR#:  [id number]  DATE OF BIRTH:  1971/05/16  SUBJECTIVE:   patient eating breakfast earlier.  Feels overall a lot better. She is currently on room air. Denies any other complaints. No family at bedside. REVIEW OF SYSTEMS:   Review of Systems  Constitutional:  Negative for chills, fever and weight loss.  HENT:  Negative for ear discharge, ear pain and nosebleeds.   Eyes:  Negative for blurred vision, pain and discharge.  Respiratory:  Negative for wheezing and stridor.   Cardiovascular:  Negative for chest pain, palpitations, orthopnea and PND.  Gastrointestinal:  Negative for abdominal pain, diarrhea, nausea and vomiting.  Genitourinary:  Negative for frequency and urgency.  Musculoskeletal:  Negative for back pain and joint pain.  Neurological:  Positive for focal weakness and weakness. Negative for sensory change and speech change.  Psychiatric/Behavioral:  Negative for depression and hallucinations. The patient is not nervous/anxious.   Tolerating Diet yes Tolerating PT: CIR  DRUG ALLERGIES:   Allergies  Allergen Reactions   Chantix [Varenicline Tartrate] Other (See Comments)    Feels "crazy" and angry     VITALS:  Blood pressure (!) 155/80, pulse 91, temperature 98.5 F (36.9 C), temperature source Oral, resp. rate 17, height 5\' 7"  (1.702 m), weight 98.9 kg, SpO2 (!) 88 %.  PHYSICAL EXAMINATION:   Physical Exam  GENERAL:  50 y.o.-year-old patient lying in the bed with no acute distress. obese HEENT: Head atraumatic, normocephalic. Oropharynx and nasopharynx clear.  LUNGS: decreased breath sounds bilaterally, no wheezing, rales, rhonchi. No use of accessory muscles of respiration.  CARDIOVASCULAR: S1, S2 normal. No murmurs, rubs, or gallops.  ABDOMEN: Soft, nontender, nondistended. Bowel sounds present. No organomegaly or mass.  EXTREMITIES: No cyanosis, clubbing or edema  b/l.   Left lower extremity Aircast. NEUROLOGIC: chronic left upper extremity hemiparesis due to previous stroke. PSYCHIATRIC:  patient is alert and oriented x 3.  SKIN: No obvious rash, lesion, or ulcer.   LABORATORY PANEL:  CBC Recent Labs  Lab 11/24/21 0448  WBC 9.0  HGB 12.0  HCT 37.3  PLT 360     Chemistries  Recent Labs  Lab 11/22/21 0650  NA 134*  K 3.5  CL 103  CO2 25  GLUCOSE 124*  BUN 12  CREATININE 0.73  CALCIUM 9.2    Cardiac Enzymes No results for input(s): TROPONINI in the last 168 hours. RADIOLOGY:  MR BRAIN WO CONTRAST  Result Date: 11/23/2021 CLINICAL DATA:  Transient ischemic attack EXAM: MRI HEAD WITHOUT CONTRAST TECHNIQUE: Multiplanar, multiecho pulse sequences of the brain and surrounding structures were obtained without intravenous contrast. COMPARISON:  06/27/2021 FINDINGS: Brain: No acute infarct, mass effect or extra-axial collection. No acute or chronic hemorrhage. Old right occipital and parietal cortical infarcts. Otherwise mild chronic small vessel disease. There is advanced chronic ischemic change within the brainstem and extending into the cerebellar peduncles. Mild generalized volume loss. The midline structures are normal. Vascular: Major flow voids are preserved. Skull and upper cervical spine: Normal calvarium and skull base. Visualized upper cervical spine and soft tissues are normal. Sinuses/Orbits:No paranasal sinus fluid levels or advanced mucosal thickening. No mastoid or middle ear effusion. Normal orbits. IMPRESSION: 1. No acute intracranial abnormality. 2. Old right occipital and parietal cortical infarcts. 3. Advanced chronic ischemic change within the brainstem and cerebellar peduncles. Electronically Signed   By: Ulyses Jarred M.D.   On: 11/23/2021 23:42  ASSESSMENT AND PLAN:   Erika [name] is a 50 y.o. Caucasian female with medical history significant for coronary artery disease, carotid artery disease, diastolic  dysfunction, depression, PAD and PTSD, who presented to the ER with acute onset of worsening cough over the last week and this area got significantly worse since yesterday She had an accidental mechanical fall yesterday with subsequent left foot pain  Chest x-ray showed patchy opacities of the left greater than the right lung bases suspicious for pneumonia.   Left ankle x-ray showed age-indeterminate avulsion fracture from the tip of the medial malleolus.   Left knee x-ray was negative.  Acute hypoxic respiratory failure secondary to community acquired pneumonia -- patient came in with increasing cough shortness of breath and briefly dropped her sats 85% on room air. Currently on 4 L nasal cannula oxygen was sats 94% -- IV Levaquin -- chest x-ray positive for pneumonia -- blood culture negative -- Pro calcitonin 19.2--recheck in am -- white count trending 19K--16K--9.0 --no fever --wean oxygen down to RA if able to keep sats >92% --12/3-- patient is weaned to room air.  Avulsion fracture of medial malleolus left foot status post mechanical fall at home -- PRN pain meds -- seen by Dr Roland Rack-- recommends Air cast and weight-bearing as tolerated with PT --PT rec rehab  history of CVA with chronic left upper extremity paresis -- continue aspirin Plavix -- continue statins --12/1--MRI brain done per husbands request--no new CVA  type II diabetes with hyperglycemia -- continue insulin and sliding scale  coronary artery disease --continue Imdur, prn  sublingual nitroglycerin, statin therapy, Plavix and Coreg.  Procedures: Family communication : none today Consults : orthopedic CODE STATUS: full DVT Prophylaxis : enoxaparin Level of care: Telemetry Medical Status is: Inpatient  Remains inpatient appropriate because: Pneumonia and avulsion fracture informed by CIR insurance approval obtained. Awaiting bed availability at [facility] and [place]. Patient is aware of CIR discharge when bed  available.        TOTAL TIME TAKING CARE OF THIS PATIENT: 25 minutes.  >50% time spent on counselling and coordination of care  Note: This dictation was prepared with Dragon dictation along with smaller phrase technology. Any transcriptional errors that result from this process are unintentional.  Fritzi Mandes M.D    [facility]   CC: Primary care physician; Lesleigh Noe, MD Patient ID: Andrez Grime, female   DOB: 1971-08-29, 50 y.o.   MRN: [id number]

## 2021-11-26 DIAGNOSIS — F32A Depression, unspecified: Secondary | ICD-10-CM

## 2021-11-26 DIAGNOSIS — F330 Major depressive disorder, recurrent, mild: Secondary | ICD-10-CM | POA: Diagnosis present

## 2021-11-26 LAB — CULTURE, BLOOD (ROUTINE X 2)
Culture: NO GROWTH
Culture: NO GROWTH
Special Requests: ADEQUATE
Special Requests: ADEQUATE

## 2021-11-26 LAB — GLUCOSE, CAPILLARY
Glucose-Capillary: 116 mg/dL — ABNORMAL HIGH (ref 70–99)
Glucose-Capillary: 129 mg/dL — ABNORMAL HIGH (ref 70–99)
Glucose-Capillary: 104 mg/dL — ABNORMAL HIGH (ref 70–99)

## 2021-11-26 NOTE — Progress Notes (Addendum)
[facility] NAME: Erika [name]    MR#:  [id number]  DATE OF BIRTH:  [birth year]  SUBJECTIVE:   patient eating breakfast earlier. No family at present Pt told me she sat in the chair yday. No respiratory distress REVIEW OF SYSTEMS:   Review of Systems  Constitutional:  Negative for chills, fever and weight loss.  HENT:  Negative for ear discharge, ear pain and nosebleeds.   Eyes:  Negative for blurred vision, pain and discharge.  Respiratory:  Negative for wheezing and stridor.   Cardiovascular:  Negative for chest pain, palpitations, orthopnea and PND.  Gastrointestinal:  Negative for abdominal pain, diarrhea, nausea and vomiting.  Genitourinary:  Negative for frequency and urgency.  Musculoskeletal:  Negative for back pain and joint pain.  Neurological:  Positive for focal weakness and weakness. Negative for sensory change and speech change.  Psychiatric/Behavioral:  Negative for depression and hallucinations. The patient is not nervous/anxious.   Tolerating Diet yes Tolerating PT: CIR  DRUG ALLERGIES:   Allergies  Allergen Reactions   Chantix [Varenicline Tartrate] Other (See Comments)    Feels "crazy" and angry     VITALS:  Blood pressure (!) 141/88, pulse 83, temperature 97.7 F (36.5 C), temperature source Oral, resp. rate 16, height 5\' 7"  (1.702 m), weight 98.9 kg, SpO2 93 %.  PHYSICAL EXAMINATION:   Physical Exam  GENERAL:  50 y.o.-year-old patient lying in the bed with no acute distress.Morbid obesity HEENT: Head atraumatic, normocephalic. Oropharynx and nasopharynx clear.  LUNGS: decreased breath sounds bilaterally, no wheezing, rales, rhonchi. No use of accessory muscles of respiration.  CARDIOVASCULAR: S1, S2 normal. No murmurs, rubs, or gallops.  ABDOMEN: Soft, nontender, nondistended. Bowel sounds present. No organomegaly or mass.  EXTREMITIES: No cyanosis, clubbing or edema b/l.   Left lower  extremity Aircast. NEUROLOGIC: chronic left upper extremity hemiparesis due to previous stroke. Mild  PSYCHIATRIC:  patient is alert and oriented x 3.  SKIN: No obvious rash, lesion, or ulcer.   LABORATORY PANEL:  CBC Recent Labs  Lab 11/24/21 0448  WBC 9.0  HGB 12.0  HCT 37.3  PLT 360     Chemistries  Recent Labs  Lab 11/22/21 0650  NA 134*  K 3.5  CL 103  CO2 25  GLUCOSE 124*  BUN 12  CREATININE 0.73  CALCIUM 9.2    Cardiac Enzymes No results for input(s): TROPONINI in the last 168 hours. RADIOLOGY:  No results found. ASSESSMENT AND PLAN:   Erika [name] is a 50 y.o. Caucasian female with medical history significant for coronary artery disease, carotid artery disease, diastolic dysfunction, depression, PAD and PTSD, who presented to the ER with acute onset of worsening cough over the last week and this area got significantly worse since yesterday She had an accidental mechanical fall yesterday with subsequent left foot pain  Chest x-ray showed patchy opacities of the left greater than the right lung bases suspicious for pneumonia.   Left ankle x-ray showed age-indeterminate avulsion fracture from the tip of the medial malleolus.   Left knee x-ray was negative.  Acute hypoxic respiratory failure secondary to community acquired pneumonia -- patient came in with increasing cough shortness of breath and briefly dropped her sats 85% on room air. Currently on 4 L nasal cannula oxygen was sats 94% -- IV Levaquin -- chest x-ray positive for pneumonia -- blood culture negative -- Pro calcitonin 19.2--recheck in am -- white count trending 19K--16K--9.0 --no fever --  wean oxygen down to keep sats >92% --incentive spirometer  Avulsion fracture of medial malleolus left foot status post mechanical fall at home -- PRN pain meds -- seen by Dr Roland Rack-- recommends Air cast and weight-bearing as tolerated with PT --PT rec rehab/CIR  history of CVA with chronic left  upper extremity paresis -- continue aspirin Plavix -- continue statins --12/1--MRI brain done per husbands request--no new CVA  type II diabetes with hyperglycemia -- continue insulin and sliding scale  coronary artery disease --continue Imdur, prn  sublingual nitroglycerin, statin, ASA, Plavix and Coreg.  H/o Depression/PTSD --pt follows with Dr Dossie Der Psych as out pt--recent video visit 11/13/21--changed to Lexapro --d/ced recently Prozac due to interaction with Plavix (per psych notes) --d/w husband who would like psych evaluation for "manipulative behavior" --message sent to Dr Weber Cooks to see pt --she is on cogentin, clonazepam, lexapro, thorazine, lamictal   Family communication : husband on the phone 11/26/21 Consults : orthopedic, psych CODE STATUS: full DVT Prophylaxis : enoxaparin Level of care: Telemetry Medical Status is: Inpatient  Remains inpatient appropriate because: Pneumonia and fracture Pt has been accepted for CIR at [facility]--awaiting for bed to open up    [place] THIS PATIENT: 25 minutes.  >50% time spent on counselling and coordination of care  Note: This dictation was prepared with Dragon dictation along with smaller phrase technology. Any transcriptional errors that result from this process are unintentional.  Fritzi Mandes M.D    [facility]   CC: Primary care physician; Lesleigh Noe, MD Patient ID: Erika [name], female   DOB: 1971-10-23, 50 y.o.   MRN: [id number]

## 2021-11-26 NOTE — Consult Note (Signed)
[facility] Face-to-Face Psychiatry Consult   Reason for Consult:  Depression Referring Physician:  Dr Posey Pronto Patient Identification: Erika [name] MRN:  [id number] Principal Diagnosis: CAP (community acquired pneumonia) Diagnosis:  Principal Problem:   CAP (community acquired pneumonia) Active Problems:   Major depressive disorder, recurrent episode, mild (HCC)   Depression   Ankle fracture, left   Closed avulsion fracture of medial malleolus of left tibia   Total Time spent with patient: 1 hour  Subjective:   Erika [name] is a 50 y.o. female patient admitted with CVA sequela with broken left foot, consult for depression.  HPI:  50 yo female with depression and anxiety, history of CVA.   Patient evaluated by this writer in her room, she is sitting in reclining chair calm, cooperative, pleasant. She reports history of depression sees Dr Adele Schilder outpatient in [place]. She reports her depression is well controlled on the current medication regimen. She reports her sleep is "ok", appetite is "ok". She lives with her husband at home, he is supportive. She denies any concerns today. She denies any suicidal or homicidal ideation. She denies any audio or visual hallucinations.   Past Psychiatric History: depression, anxiety, insomnia  Risk to Self:  none Risk to Others:  none Prior Inpatient Therapy:  not recently Prior Outpatient Therapy:  Dr Adele Schilder  Past Medical History:  Past Medical History:  Diagnosis Date   Arthralgia of temporomandibular joint    CAD, multiple vessel    a. 06/2016 Cath: ostLM 40%, ostLAD 40%, pLAD 95%, ost-pLCx 60%, pLCx 95%, mLCx 60%, mRCA 95%, D2 50%, LVSF nl;  b. 07/2016 CABG x 4 (LIMA->LAD, VG->Diag, VG->OM, VG->RCA); c. 08/2016 Cath: 3VD w/ 4/4 patent grafts. LAD distal to LIMA has diff dzs->Med rx; d. 08/2020 Cath: 4/4 patent grafts, native 3VD. EF 55-65%-->Med Rx.   Carotid arterial disease ([place])    a. 07/2016 s/p R CEA; b. 02/2021 U/S: RICA 77-41%, LICA  2-87%.   Clotting disorder ([place])    Depression    Diastolic dysfunction    a. 06/2016 Echo: EF 50-55%, mild inf wall HK, GR1DD, mild MR, RV sys fxn nl, mildly dilated LA, PASP nl   Fatty liver disease, nonalcoholic 8676   History of blood transfusion    with heart surgery   HLD (hyperlipidemia)    Labile hypertension    a. prior renal ngiogram negative for RAS in 03/2016; b. catecholamines and metanephrines normal, mildly elevated renin with normal aldosterone and normal ratio in 02/2016   Myocardial infarction [facility]) 2017   Obesity    PAD (peripheral artery disease) ([place])    a. 09/2018 s/p L SFA stenting; b. 07/2019 Periph Angio: Patent m/d L SFA stent w/ 100% L SFA distal to stent. L AT 100d, L Peroneal diff dzs-->Med Rx; c. 02/2021 ABIs: stable @ 0.61 on R and 0.46 on L.   PTSD (post-traumatic stress disorder)    Tobacco abuse    Type 2 diabetes mellitus ([place]) 12/2015    Past Surgical History:  Procedure Laterality Date   ABDOMINAL AORTOGRAM W/LOWER EXTREMITY [name] 10/15/2018   Procedure: ABDOMINAL AORTOGRAM W/LOWER EXTREMITY;  Surgeon: Wellington Hampshire, MD;  Location: [facility];  Service: Cardiovascular;  Laterality: [name];   ABDOMINAL AORTOGRAM W/LOWER EXTREMITY Bilateral 08/19/2019   Procedure: ABDOMINAL AORTOGRAM W/LOWER EXTREMITY;  Surgeon: Wellington Hampshire, MD;  Location: [facility];  Service: Cardiovascular;  Laterality: Bilateral;   CARDIAC CATHETERIZATION [name] 06/29/2016   Procedure: Left Heart Cath and Coronary Angiography;  Surgeon: Minna Merritts, MD;  Location: [facility];  Service: Cardiovascular;  Laterality: [name];   CARDIAC CATHETERIZATION [name] 08/29/2016   Procedure: Left Heart Cath and Cors/Grafts Angiography;  Surgeon: Wellington Hampshire, MD;  Location: [facility];  Service: Cardiovascular;  Laterality: [name];   CESAREAN SECTION     CHOLECYSTECTOMY     CORONARY ARTERY BYPASS GRAFT [name] 07/06/2016   Procedure: CORONARY ARTERY BYPASS GRAFTING (CABG)  x four, using left internal mammary artery and right leg greater saphenous vein harvested endoscopically;  Surgeon: Ivin Poot, MD;  Location: [facility];  Service: Open Heart Surgery;  Laterality: [name];   ENDARTERECTOMY Right 07/06/2016   Procedure: ENDARTERECTOMY CAROTID;  Surgeon: Rosetta Posner, MD;  Location: [facility] OR;  Service: Vascular;  Laterality: Right;   ENDARTERECTOMY Right 04/27/2020   Procedure: REDO OF RIGHT ENDARTERECTOMY CAROTID;  Surgeon: Rosetta Posner, MD;  Location: [facility] OR;  Service: Vascular;  Laterality: Right;   LEFT HEART CATH AND CORS/GRAFTS ANGIOGRAPHY [name] 08/24/2020   Procedure: LEFT HEART CATH AND CORS/GRAFTS ANGIOGRAPHY;  Surgeon: Wellington Hampshire, MD;  Location: [place] [facility];  Service: Cardiovascular;  Laterality: [name];   PERIPHERAL VASCULAR CATHETERIZATION [name] 04/18/2016   Procedure: Renal Angiography;  Surgeon: Wellington Hampshire, MD;  Location: [place] [facility];  Service: Cardiovascular;  Laterality: [name];   PERIPHERAL VASCULAR INTERVENTION Left 10/15/2018   Procedure: PERIPHERAL VASCULAR INTERVENTION;  Surgeon: Wellington Hampshire, MD;  Location: [place] [facility];  Service: Cardiovascular;  Laterality: Left;  Left superficial femoral   TEE WITHOUT CARDIOVERSION [name] 07/06/2016   Procedure: TRANSESOPHAGEAL ECHOCARDIOGRAM (TEE);  Surgeon: Ivin Poot, MD;  Location: [facility];  Service: Open Heart Surgery;  Laterality: [name];   TONSILLECTOMY     Family History:  Family History  Adopted: Yes  Problem Relation Age of Onset   Diabetes Mother    Diabetes Father    Alcohol abuse Father    Heart disease Father    Drug abuse Father    Stroke Sister    Anxiety disorder Sister    Family Psychiatric  History: please see above Social History:  Social History   Substance and Sexual Activity  Alcohol Use Not Currently   Alcohol/week: 0.0 standard drinks   Comment: socially     Social History   Substance and Sexual Activity  Drug Use No    Social History    Socioeconomic History   Marital status: Married    Spouse name: Not on file   Number of children: 1   Years of education: 14   Highest education level: Not on file  Occupational History   Occupation: Disability  Tobacco Use   Smoking status: Former    Packs/day: 0.50    Years: 27.00    Pack years: 13.50    Types: Cigarettes   Smokeless tobacco: Never  Scientific laboratory technician Use: Every day   Substances: Flavoring  Substance and Sexual Activity   Alcohol use: Not Currently    Alcohol/week: 0.0 standard drinks    Comment: socially   Drug use: No   Sexual activity: Yes    Partners: Male    Birth control/protection: None  Other Topics Concern   Not on file  Social History Narrative   11/21/20   From: [name]   Living: with husband, [name] (2009) and special needs daughter    Work: disability       Family: daughter - [name] (special needs,  lives with her) and 2 grown step children - [name] and [name]       Enjoys: play with dogs - 2 german Shepard, mut, and blue tick walker mix      Exercise: PAD limits exercise   Diet: diabetic diet and low potassium due to daughter      Safety   Seat belts: Yes    Guns: Yes  and secure   Safe in relationships: Yes    Social Determinants of Health   Financial Resource Strain: Not on file  Food Insecurity: Not on file  Transportation Needs: Not on file  Physical Activity: Not on file  Stress: Not on file  Social Connections: Not on file   Additional Social History:    Allergies:   Allergies  Allergen Reactions   Chantix [Varenicline Tartrate] Other (See Comments)    Feels "crazy" and angry     Labs:  Results for orders placed or performed during the hospital encounter of 11/21/21 (from the past 48 hour(s))  Glucose, capillary     Status: Abnormal   Collection Time: 11/24/21  4:11 PM  Result Value Ref Range   Glucose-Capillary 140 (H) 70 - 99 mg/dL    Comment: Glucose reference range applies only to  samples taken after fasting for at least 8 hours.  Glucose, capillary     Status: Abnormal   Collection Time: 11/24/21  8:33 PM  Result Value Ref Range   Glucose-Capillary 126 (H) 70 - 99 mg/dL    Comment: Glucose reference range applies only to samples taken after fasting for at least 8 hours.  Glucose, capillary     Status: None   Collection Time: 11/25/21  7:47 AM  Result Value Ref Range   Glucose-Capillary 92 70 - 99 mg/dL    Comment: Glucose reference range applies only to samples taken after fasting for at least 8 hours.  Glucose, capillary     Status: Abnormal   Collection Time: 11/25/21 11:28 AM  Result Value Ref Range   Glucose-Capillary 116 (H) 70 - 99 mg/dL    Comment: Glucose reference range applies only to samples taken after fasting for at least 8 hours.  Glucose, capillary     Status: Abnormal   Collection Time: 11/25/21  3:30 PM  Result Value Ref Range   Glucose-Capillary 107 (H) 70 - 99 mg/dL    Comment: Glucose reference range applies only to samples taken after fasting for at least 8 hours.  Glucose, capillary     Status: None   Collection Time: 11/25/21  8:54 PM  Result Value Ref Range   Glucose-Capillary 99 70 - 99 mg/dL    Comment: Glucose reference range applies only to samples taken after fasting for at least 8 hours.  Glucose, capillary     Status: Abnormal   Collection Time: 11/26/21 11:48 AM  Result Value Ref Range   Glucose-Capillary 116 (H) 70 - 99 mg/dL    Comment: Glucose reference range applies only to samples taken after fasting for at least 8 hours.   *Note: Due to a large number of results and/or encounters for the requested time period, some results have not been displayed. A complete set of results can be found in Results Review.    Current Facility-Administered Medications  Medication Dose Route Frequency Provider Last Rate Last Admin   acetaminophen (TYLENOL) tablet 650 mg  650 mg Oral Q6H PRN Mansy, Arvella Merles, MD       Or   acetaminophen  (TYLENOL)  suppository 650 mg  650 mg Rectal Q6H PRN Mansy, Jan A, MD       albuterol (PROVENTIL) (2.5 MG/3ML) 0.083% nebulizer solution 2.5 mg  2.5 mg Nebulization Q6H PRN Mansy, Jan A, MD       aspirin chewable tablet 81 mg  81 mg Oral Daily Mansy, Jan A, MD   81 mg at 11/26/21 0932   benztropine (COGENTIN) tablet 0.5 mg  0.5 mg Oral QHS Mansy, Jan A, MD   0.5 mg at 11/25/21 2052   carvedilol (COREG) tablet 6.25 mg  6.25 mg Oral BID Mansy, Jan A, MD   6.25 mg at 11/26/21 0932   chlorpheniramine-HYDROcodone (TUSSIONEX) 10-8 MG/5ML suspension 5 mL  5 mL Oral Q12H PRN Mansy, Jan A, MD       chlorproMAZINE (THORAZINE) tablet 100 mg  100 mg Oral QHS Mansy, Jan A, MD   100 mg at 11/25/21 2052   clonazePAM (KLONOPIN) tablet 0.5 mg  0.5 mg Oral BID Mansy, Jan A, MD   0.5 mg at 11/26/21 0931   clopidogrel (PLAVIX) tablet 75 mg  75 mg Oral Daily Mansy, Jan A, MD   75 mg at 11/26/21 0931   cyanocobalamin ((VITAMIN B-12)) injection 1,000 mcg  1,000 mcg Subcutaneous Q30 days Mansy, Jan A, MD       cyclobenzaprine (FLEXERIL) tablet 10 mg  10 mg Oral QHS Mansy, Jan A, MD   10 mg at 11/25/21 2052   enoxaparin (LOVENOX) injection 50 mg  0.5 mg/kg Subcutaneous Q24H Mansy, Jan A, MD   50 mg at 11/25/21 2055   escitalopram (LEXAPRO) tablet 20 mg  20 mg Oral Daily Mansy, Jan A, MD   20 mg at 11/26/21 0934   ezetimibe (ZETIA) tablet 10 mg  10 mg Oral Daily Mansy, Jan A, MD   10 mg at 11/26/21 0934   guaiFENesin (MUCINEX) 12 hr tablet 600 mg  600 mg Oral BID Mansy, Jan A, MD   600 mg at 11/26/21 3790   hydrALAZINE (APRESOLINE) injection 5 mg  5 mg Intravenous Q4H PRN Rise Patience, MD   5 mg at 11/26/21 0002   HYDROcodone-acetaminophen (NORCO/VICODIN) 5-325 MG per tablet 1-2 tablet  1-2 tablet Oral Q6H PRN Fritzi Mandes, MD   2 tablet at 11/26/21 0745   ibuprofen (ADVIL) tablet 400 mg  400 mg Oral Q8H PRN Fritzi Mandes, MD       icosapent Ethyl (VASCEPA) 1 g capsule 2 g  2 g Oral BID Mansy, Jan A, MD   2 g at  11/26/21 0933   insulin aspart (novoLOG) injection 0-9 Units  0-9 Units Subcutaneous TID [facility] Mansy, Jan A, MD   1 Units at 11/24/21 2056   insulin glargine-yfgn [facility]) injection 54 Units  54 Units Subcutaneous Daily Mansy, Jan A, MD   54 Units at 11/26/21 0939   ipratropium-albuterol (DUONEB) 0.5-2.5 (3) MG/3ML nebulizer solution 3 mL  3 mL Nebulization QID Mansy, Jan A, MD   3 mL at 11/26/21 1200   isosorbide mononitrate (IMDUR) 24 hr tablet 60 mg  60 mg Oral BID Mansy, Jan A, MD   60 mg at 11/26/21 0933   lamoTRIgine (LAMICTAL) tablet 200 mg  200 mg Oral QHS Mansy, Jan A, MD   200 mg at 11/25/21 2052   magnesium hydroxide (MILK OF MAGNESIA) suspension 30 mL  30 mL Oral Daily PRN Mansy, Jan A, MD       morphine 2 MG/ML injection 1 mg  1  mg Intravenous Q8H PRN Fritzi Mandes, MD   1 mg at 11/25/21 1445   nitroGLYCERIN (NITROSTAT) SL tablet 0.4 mg  0.4 mg Sublingual Q5 min PRN Mansy, Jan A, MD       ondansetron [facility]) tablet 4 mg  4 mg Oral Q6H PRN Mansy, Jan A, MD       Or   ondansetron [facility]) injection 4 mg  4 mg Intravenous Q6H PRN Mansy, Jan A, MD       ondansetron [facility]) tablet 4 mg  4 mg Oral Daily PRN Mansy, Jan A, MD       pantoprazole (PROTONIX) EC tablet 40 mg  40 mg Oral Daily Mansy, Jan A, MD   40 mg at 11/26/21 0932   polyethylene glycol (MIRALAX / GLYCOLAX) packet 17 g  17 g Oral BID Mansy, Jan A, MD       potassium chloride (KLOR-CON) packet 20 mEq  20 mEq Oral Daily Mansy, Jan A, MD   20 mEq at 11/26/21 0936   pregabalin (LYRICA) capsule 225 mg  225 mg Oral BID Mansy, Jan A, MD   225 mg at 11/26/21 0930   rosuvastatin (CRESTOR) tablet 20 mg  20 mg Oral Daily Mansy, Jan A, MD   20 mg at 11/26/21 0932   senna-docusate (Senokot-S) tablet 2 tablet  2 tablet Oral BID Mansy, Jan A, MD   2 tablet at 11/26/21 0930   spironolactone (ALDACTONE) tablet 25 mg  25 mg Oral Daily Mansy, Jan A, MD   25 mg at 11/26/21 0930   torsemide (DEMADEX) tablet 10 mg  10 mg Oral Daily Fritzi Mandes,  MD   10 mg at 11/26/21 0931   traZODone (DESYREL) tablet 25 mg  25 mg Oral QHS PRN Mansy, Jan A, MD   25 mg at 11/25/21 2051   vitamin B-12 (CYANOCOBALAMIN) tablet 1,000 mcg  1,000 mcg Oral Daily Mansy, Jan A, MD   1,000 mcg at 11/26/21 0932   Vitamin D (Ergocalciferol) (DRISDOL) capsule 50,000 Units  50,000 Units Oral Q7 days Mansy, Jan A, MD   50,000 Units at 11/22/21 1610    Musculoskeletal: Strength & Muscle Tone: decreased Gait & Station:  did not witness Patient leans: [name]  Psychiatric Specialty Exam: Physical Exam Vitals and nursing note reviewed.  Constitutional:      Appearance: Normal appearance.  HENT:     Head: Normocephalic.     Nose: Nose normal.  Pulmonary:     Effort: Pulmonary effort is normal.  Musculoskeletal:     Cervical back: Normal range of motion.  Neurological:     General: No focal deficit present.     Mental Status: She is alert and oriented to person, place, and time.  Psychiatric:        Attention and Perception: Attention and perception normal.        Mood and Affect: Mood is depressed.        Speech: Speech normal.        Behavior: Behavior normal. Behavior is cooperative.        Thought Content: Thought content normal.        Cognition and Memory: Cognition and memory normal.        Judgment: Judgment normal.    Review of Systems  Cardiovascular:  Positive for leg swelling.       Left leg pain  Psychiatric/Behavioral:  Positive for depression.   All other systems reviewed and are negative.  Blood pressure 124/83, pulse 88, temperature  97.7 F (36.5 C), temperature source Oral, resp. rate 19, height 5\' 7"  (1.702 m), weight 98.9 kg, SpO2 97 %.Body mass index is 34.15 kg/m.  General Appearance: Casual  Eye Contact:  Good  Speech:  Normal Rate  Volume:  Normal  Mood:  Depressed  Affect:  Congruent  Thought Process:  Coherent and Descriptions of Associations: Intact  Orientation:  Full (Time, Place, and Person)  Thought Content:  WDL and  Logical  Suicidal Thoughts:  No  Homicidal Thoughts:  No  Memory:  Immediate;   Good Recent;   Good Remote;   Good  Judgement:  Good  Insight:  Good  Psychomotor Activity:  Decreased  Concentration:  Concentration: Good and Attention Span: Good  Recall:  Good  Fund of Knowledge:  Good  Language:  Good  Akathisia:  No  Handed:  Right  AIMS (if indicated):     Assets:  Housing Leisure Time Resilience Social Support  ADL's:  Impaired  Cognition:  WNL  Sleep:        Physical Exam: Physical Exam Vitals and nursing note reviewed.  Constitutional:      Appearance: Normal appearance.  HENT:     Head: Normocephalic.     Nose: Nose normal.  Pulmonary:     Effort: Pulmonary effort is normal.  Musculoskeletal:     Cervical back: Normal range of motion.  Neurological:     General: No focal deficit present.     Mental Status: She is alert and oriented to person, place, and time.  Psychiatric:        Attention and Perception: Attention and perception normal.        Mood and Affect: Mood is depressed.        Speech: Speech normal.        Behavior: Behavior normal. Behavior is cooperative.        Thought Content: Thought content normal.        Cognition and Memory: Cognition and memory normal.        Judgment: Judgment normal.   Review of Systems  Cardiovascular:  Positive for leg swelling.       Left leg pain  Psychiatric/Behavioral:  Positive for depression.   All other systems reviewed and are negative. Blood pressure 124/83, pulse 88, temperature 97.7 F (36.5 C), temperature source Oral, resp. rate 19, height 5\' 7"  (1.702 m), weight 98.9 kg, SpO2 97 %. Body mass index is 34.15 kg/m.  Treatment Plan Summary: Major depressive disorder, recurrent, mild: -Continue Lexapro 20 mg daily -Continue with outpatient provider, Dr Adele Schilder  Insomnia: -Continue Thorazine 100 mg daily at bedtime -Continue Trazodone 25 mg at bedtime PRN  Anxiety: -Continue Klonopin 0.5 mg  BID   Disposition: No evidence of imminent risk to self or others at present.   Patient does not meet criteria for psychiatric inpatient admission. She is cleared for psychiatric standpoint.   Waylan Boga, NP 11/26/2021 1:17 PM

## 2021-11-27 ENCOUNTER — Other Ambulatory Visit: Payer: Self-pay

## 2021-11-27 ENCOUNTER — Inpatient Hospital Stay (HOSPITAL_COMMUNITY)
Admission: RE | Admit: 2021-11-27 | Discharge: 2021-12-08 | DRG: 945 | Disposition: A | Discharge status: Skilled Nursing Facility | Payer: Managed Care, Other (non HMO) | Source: Other Acute Inpatient Hospital | Attending: Physical Medicine and Rehabilitation | Admitting: Physical Medicine and Rehabilitation

## 2021-11-27 ENCOUNTER — Encounter (HOSPITAL_COMMUNITY): Payer: Self-pay | Admitting: Physical Medicine and Rehabilitation

## 2021-11-27 DIAGNOSIS — E1151 Type 2 diabetes mellitus with diabetic peripheral angiopathy without gangrene: Secondary | ICD-10-CM | POA: Diagnosis present

## 2021-11-27 DIAGNOSIS — F4310 Post-traumatic stress disorder, unspecified: Secondary | ICD-10-CM | POA: Diagnosis present

## 2021-11-27 DIAGNOSIS — Z813 Family history of other psychoactive substance abuse and dependence: Secondary | ICD-10-CM

## 2021-11-27 DIAGNOSIS — I2510 Atherosclerotic heart disease of native coronary artery without angina pectoris: Secondary | ICD-10-CM | POA: Diagnosis present

## 2021-11-27 DIAGNOSIS — K760 Fatty (change of) liver, not elsewhere classified: Secondary | ICD-10-CM | POA: Diagnosis present

## 2021-11-27 DIAGNOSIS — I252 Old myocardial infarction: Secondary | ICD-10-CM

## 2021-11-27 DIAGNOSIS — Z823 Family history of stroke: Secondary | ICD-10-CM

## 2021-11-27 DIAGNOSIS — E785 Hyperlipidemia, unspecified: Secondary | ICD-10-CM | POA: Diagnosis present

## 2021-11-27 DIAGNOSIS — Z888 Allergy status to other drugs, medicaments and biological substances status: Secondary | ICD-10-CM | POA: Diagnosis not present

## 2021-11-27 DIAGNOSIS — R5381 Other malaise: Secondary | ICD-10-CM | POA: Diagnosis not present

## 2021-11-27 DIAGNOSIS — Z8249 Family history of ischemic heart disease and other diseases of the circulatory system: Secondary | ICD-10-CM | POA: Diagnosis not present

## 2021-11-27 DIAGNOSIS — Z811 Family history of alcohol abuse and dependence: Secondary | ICD-10-CM | POA: Diagnosis not present

## 2021-11-27 DIAGNOSIS — J189 Pneumonia, unspecified organism: Secondary | ICD-10-CM | POA: Diagnosis not present

## 2021-11-27 DIAGNOSIS — K219 Gastro-esophageal reflux disease without esophagitis: Secondary | ICD-10-CM | POA: Diagnosis present

## 2021-11-27 DIAGNOSIS — I5032 Chronic diastolic (congestive) heart failure: Secondary | ICD-10-CM | POA: Diagnosis present

## 2021-11-27 DIAGNOSIS — Z833 Family history of diabetes mellitus: Secondary | ICD-10-CM | POA: Diagnosis not present

## 2021-11-27 DIAGNOSIS — I110 Hypertensive heart disease with heart failure: Secondary | ICD-10-CM | POA: Diagnosis present

## 2021-11-27 DIAGNOSIS — I69398 Other sequelae of cerebral infarction: Secondary | ICD-10-CM

## 2021-11-27 DIAGNOSIS — K5900 Constipation, unspecified: Secondary | ICD-10-CM | POA: Diagnosis present

## 2021-11-27 DIAGNOSIS — Z7985 Long-term (current) use of injectable non-insulin antidiabetic drugs: Secondary | ICD-10-CM

## 2021-11-27 DIAGNOSIS — Z87891 Personal history of nicotine dependence: Secondary | ICD-10-CM

## 2021-11-27 DIAGNOSIS — F410 Panic disorder [episodic paroxysmal anxiety] without agoraphobia: Secondary | ICD-10-CM

## 2021-11-27 DIAGNOSIS — W19XXXD Unspecified fall, subsequent encounter: Secondary | ICD-10-CM | POA: Diagnosis present

## 2021-11-27 DIAGNOSIS — F329 Major depressive disorder, single episode, unspecified: Secondary | ICD-10-CM | POA: Diagnosis present

## 2021-11-27 DIAGNOSIS — S8252XD Displaced fracture of medial malleolus of left tibia, subsequent encounter for closed fracture with routine healing: Secondary | ICD-10-CM | POA: Diagnosis not present

## 2021-11-27 DIAGNOSIS — F331 Major depressive disorder, recurrent, moderate: Secondary | ICD-10-CM

## 2021-11-27 DIAGNOSIS — Z794 Long term (current) use of insulin: Secondary | ICD-10-CM

## 2021-11-27 DIAGNOSIS — Z7982 Long term (current) use of aspirin: Secondary | ICD-10-CM

## 2021-11-27 DIAGNOSIS — Z79899 Other long term (current) drug therapy: Secondary | ICD-10-CM | POA: Diagnosis not present

## 2021-11-27 DIAGNOSIS — Z818 Family history of other mental and behavioral disorders: Secondary | ICD-10-CM

## 2021-11-27 DIAGNOSIS — G629 Polyneuropathy, unspecified: Secondary | ICD-10-CM

## 2021-11-27 DIAGNOSIS — A419 Sepsis, unspecified organism: Secondary | ICD-10-CM | POA: Diagnosis present

## 2021-11-27 DIAGNOSIS — Z951 Presence of aortocoronary bypass graft: Secondary | ICD-10-CM | POA: Diagnosis not present

## 2021-11-27 DIAGNOSIS — Z7902 Long term (current) use of antithrombotics/antiplatelets: Secondary | ICD-10-CM

## 2021-11-27 LAB — CREATININE, SERUM
Creatinine, Ser: 0.97 mg/dL (ref 0.44–1.00)
GFR, Estimated: >60 mL/min (ref >60)

## 2021-11-27 LAB — CBC WITH DIFFERENTIAL/PLATELET
Abs Immature Granulocytes: 0.2 10*3/uL — ABNORMAL HIGH (ref 0.00–0.07)
Basophils Absolute: 0 10*3/uL (ref 0.0–0.1)
Basophils Relative: 0 %
Eosinophils Absolute: 0.3 10*3/uL (ref 0.0–0.5)
Eosinophils Relative: 3 %
HCT: 39.8 % (ref 36.0–46.0)
Hemoglobin: 13 g/dL (ref 12.0–15.0)
Immature Granulocytes: 2 %
Lymphocytes Relative: 26 %
Lymphs Abs: 2.4 10*3/uL (ref 0.7–4.0)
MCH: 27.7 pg (ref 26.0–34.0)
MCHC: 32.7 g/dL (ref 30.0–36.0)
MCV: 84.7 fL (ref 80.0–100.0)
Monocytes Absolute: 0.5 10*3/uL (ref 0.1–1.0)
Monocytes Relative: 5 %
Neutro Abs: 5.7 10*3/uL (ref 1.7–7.7)
Neutrophils Relative %: 64 %
Platelets: 404 10*3/uL — ABNORMAL HIGH (ref 150–400)
RBC: 4.7 MIL/uL (ref 3.87–5.11)
RDW: 13.8 % (ref 11.5–15.5)
WBC: 9 10*3/uL (ref 4.0–10.5)
nRBC: 0 % (ref 0.0–0.2)

## 2021-11-27 LAB — CBC
HCT: 38.9 % (ref 36.0–46.0)
Hemoglobin: 12.8 g/dL (ref 12.0–15.0)
MCH: 27.8 pg (ref 26.0–34.0)
MCHC: 32.9 g/dL (ref 30.0–36.0)
MCV: 84.6 fL (ref 80.0–100.0)
Platelets: 373 10*3/uL (ref 150–400)
RBC: 4.6 MIL/uL (ref 3.87–5.11)
RDW: 13.8 % (ref 11.5–15.5)
WBC: 8.9 10*3/uL (ref 4.0–10.5)
nRBC: 0 % (ref 0.0–0.2)

## 2021-11-27 LAB — BASIC METABOLIC PANEL
Anion gap: 8 (ref 5–15)
BUN: 16 mg/dL (ref 6–20)
CO2: 29 mmol/L (ref 22–32)
Calcium: 10.1 mg/dL (ref 8.9–10.3)
Chloride: 98 mmol/L (ref 98–111)
Creatinine, Ser: 0.87 mg/dL (ref 0.44–1.00)
GFR, Estimated: >60 mL/min (ref >60)
Glucose, Bld: 216 mg/dL — ABNORMAL HIGH (ref 70–99)
Potassium: 4 mmol/L (ref 3.5–5.1)
Sodium: 135 mmol/L (ref 135–145)

## 2021-11-27 LAB — GLUCOSE, CAPILLARY
Glucose-Capillary: 107 mg/dL — ABNORMAL HIGH (ref 70–99)
Glucose-Capillary: 197 mg/dL — ABNORMAL HIGH (ref 70–99)
Glucose-Capillary: 152 mg/dL — ABNORMAL HIGH (ref 70–99)

## 2021-11-27 MED ORDER — POTASSIUM CHLORIDE 20 MEQ PO PACK
20.0000 meq | PACK | Freq: Every day | ORAL | Status: DC
  Administered 2021-11-28 – 2021-12-08 (×11): 20 meq via ORAL
  Filled 2021-11-27 (×11): qty 1

## 2021-11-27 MED ORDER — HYDROCOD POLST-CPM POLST ER 10-8 MG/5ML PO SUER: 5.0000 mL | Freq: Two times a day (BID) | ORAL | Status: DC | PRN

## 2021-11-27 MED ORDER — ENOXAPARIN SODIUM 60 MG/0.6ML IJ SOSY: 0.5000 mg/kg | PREFILLED_SYRINGE | INTRAMUSCULAR | Status: DC

## 2021-11-27 MED ORDER — ISOSORBIDE MONONITRATE ER 30 MG PO TB24
60.0000 mg | ORAL_TABLET | Freq: Two times a day (BID) | ORAL | Status: DC
  Administered 2021-11-27 – 2021-12-08 (×22): 60 mg via ORAL
  Filled 2021-11-27 (×23): qty 2

## 2021-11-27 MED ORDER — ASPIRIN 81 MG PO CHEW
81.0000 mg | CHEWABLE_TABLET | Freq: Every day | ORAL | Status: DC
  Administered 2021-11-28 – 2021-12-08 (×11): 81 mg via ORAL
  Filled 2021-11-27 (×11): qty 1

## 2021-11-27 MED ORDER — ICOSAPENT ETHYL 1 G PO CAPS
2.0000 g | ORAL_CAPSULE | Freq: Two times a day (BID) | ORAL | Status: DC
  Administered 2021-11-27 – 2021-12-08 (×22): 2 g via ORAL
  Filled 2021-11-27 (×25): qty 2

## 2021-11-27 MED ORDER — CYCLOBENZAPRINE HCL 10 MG PO TABS
10.0000 mg | ORAL_TABLET | Freq: Once | ORAL | Status: AC
  Administered 2021-11-27: 13:00:00 10 mg via ORAL
  Filled 2021-11-27: qty 1

## 2021-11-27 MED ORDER — VITAMIN D (ERGOCALCIFEROL) 1.25 MG (50000 UNIT) PO CAPS: 50000.0000 [IU] | ORAL_CAPSULE | ORAL | Status: DC

## 2021-11-27 MED ORDER — IBUPROFEN 400 MG PO TABS
400.0000 mg | ORAL_TABLET | Freq: Three times a day (TID) | ORAL | Status: DC | PRN
  Filled 2021-11-27: qty 1

## 2021-11-27 MED ORDER — HYDROCODONE-ACETAMINOPHEN 5-325 MG PO TABS
1.0000 | ORAL_TABLET | Freq: Four times a day (QID) | ORAL | 0 refills | Status: DC | PRN
Start: 2021-11-27 — End: 2021-12-08

## 2021-11-27 MED ORDER — MAGNESIUM HYDROXIDE 400 MG/5ML PO SUSP: 30.0000 mL | Freq: Every day | ORAL | Status: DC | PRN

## 2021-11-27 MED ORDER — HYDROCODONE-ACETAMINOPHEN 5-325 MG PO TABS
1.0000 | ORAL_TABLET | Freq: Four times a day (QID) | ORAL | Status: DC | PRN
  Administered 2021-11-27 – 2021-12-08 (×28): 2 via ORAL
  Filled 2021-11-27 (×28): qty 2

## 2021-11-27 MED ORDER — GUAIFENESIN ER 600 MG PO TB12
600.0000 mg | ORAL_TABLET | Freq: Two times a day (BID) | ORAL | Status: DC
  Administered 2021-11-27 – 2021-12-08 (×18): 600 mg via ORAL
  Filled 2021-11-27 (×22): qty 1

## 2021-11-27 MED ORDER — SPIRONOLACTONE 25 MG PO TABS
25.0000 mg | ORAL_TABLET | Freq: Every day | ORAL | Status: DC
  Administered 2021-11-28 – 2021-12-08 (×11): 25 mg via ORAL
  Filled 2021-11-27 (×11): qty 1

## 2021-11-27 MED ORDER — LAMOTRIGINE 100 MG PO TABS
200.0000 mg | ORAL_TABLET | Freq: Every day | ORAL | Status: DC
  Administered 2021-11-27 – 2021-12-07 (×11): 200 mg via ORAL
  Filled 2021-11-27 (×11): qty 2

## 2021-11-27 MED ORDER — PREGABALIN 75 MG PO CAPS
225.0000 mg | ORAL_CAPSULE | Freq: Two times a day (BID) | ORAL | Status: DC
  Administered 2021-11-27 – 2021-12-08 (×21): 225 mg via ORAL
  Filled 2021-11-27 (×21): qty 3

## 2021-11-27 MED ORDER — ONDANSETRON HCL 4 MG/2ML IJ SOLN: 4.0000 mg | Freq: Four times a day (QID) | INTRAMUSCULAR | Status: DC | PRN

## 2021-11-27 MED ORDER — PANTOPRAZOLE SODIUM 40 MG PO TBEC
40.0000 mg | DELAYED_RELEASE_TABLET | Freq: Every day | ORAL | Status: DC
  Administered 2021-11-28 – 2021-12-08 (×11): 40 mg via ORAL
  Filled 2021-11-27 (×11): qty 1

## 2021-11-27 MED ORDER — POLYETHYLENE GLYCOL 3350 17 G PO PACK
17.0000 g | PACK | Freq: Two times a day (BID) | ORAL | Status: DC
  Administered 2021-11-27 – 2021-12-06 (×7): 17 g via ORAL
  Filled 2021-11-27 (×19): qty 1

## 2021-11-27 MED ORDER — ESCITALOPRAM OXALATE 10 MG PO TABS
20.0000 mg | ORAL_TABLET | Freq: Every day | ORAL | Status: DC
  Administered 2021-11-28 – 2021-12-07 (×10): 20 mg via ORAL
  Filled 2021-11-27 (×10): qty 2

## 2021-11-27 MED ORDER — CYANOCOBALAMIN 1000 MCG/ML IJ SOLN: 1000.0000 ug | INTRAMUSCULAR | Status: DC

## 2021-11-27 MED ORDER — ALBUTEROL SULFATE (2.5 MG/3ML) 0.083% IN NEBU: 2.5000 mg | INHALATION_SOLUTION | Freq: Four times a day (QID) | RESPIRATORY_TRACT | Status: DC | PRN

## 2021-11-27 MED ORDER — TRAZODONE HCL 50 MG PO TABS
25.0000 mg | ORAL_TABLET | Freq: Every evening | ORAL | Status: DC | PRN
  Administered 2021-12-01 – 2021-12-02 (×2): 25 mg via ORAL
  Filled 2021-11-27 (×2): qty 1

## 2021-11-27 MED ORDER — IPRATROPIUM-ALBUTEROL 0.5-2.5 (3) MG/3ML IN SOLN
3.0000 mL | Freq: Four times a day (QID) | RESPIRATORY_TRACT | Status: DC
  Administered 2021-11-27 (×2): 3 mL via RESPIRATORY_TRACT
  Filled 2021-11-27 (×3): qty 3

## 2021-11-27 MED ORDER — BENZTROPINE MESYLATE 0.5 MG PO TABS
0.5000 mg | ORAL_TABLET | Freq: Every day | ORAL | Status: DC
  Administered 2021-11-27 – 2021-12-07 (×11): 0.5 mg via ORAL
  Filled 2021-11-27 (×12): qty 1

## 2021-11-27 MED ORDER — EZETIMIBE 10 MG PO TABS
10.0000 mg | ORAL_TABLET | Freq: Every day | ORAL | Status: DC
  Administered 2021-11-28 – 2021-12-08 (×11): 10 mg via ORAL
  Filled 2021-11-27 (×11): qty 1

## 2021-11-27 MED ORDER — CLONAZEPAM 0.5 MG PO TABS
0.5000 mg | ORAL_TABLET | Freq: Two times a day (BID) | ORAL | Status: DC
  Administered 2021-11-27 – 2021-12-08 (×22): 0.5 mg via ORAL
  Filled 2021-11-27 (×22): qty 1

## 2021-11-27 MED ORDER — IBUPROFEN 400 MG PO TABS: 400.0000 mg | ORAL_TABLET | Freq: Three times a day (TID) | ORAL | 0 refills | Status: DC | PRN

## 2021-11-27 MED ORDER — TORSEMIDE 20 MG PO TABS
10.0000 mg | ORAL_TABLET | Freq: Every day | ORAL | Status: DC
  Administered 2021-11-28 – 2021-12-08 (×11): 10 mg via ORAL
  Filled 2021-11-27 (×11): qty 1

## 2021-11-27 MED ORDER — EXERCISE FOR HEART AND HEALTH BOOK
Freq: Once | Status: AC
  Filled 2021-11-27: qty 1

## 2021-11-27 MED ORDER — ACETAMINOPHEN 650 MG RE SUPP: 650.0000 mg | Freq: Four times a day (QID) | RECTAL | Status: DC | PRN

## 2021-11-27 MED ORDER — CHLORPROMAZINE HCL 100 MG PO TABS
100.0000 mg | ORAL_TABLET | Freq: Every day | ORAL | Status: DC
  Administered 2021-11-27 – 2021-12-07 (×11): 100 mg via ORAL
  Filled 2021-11-27 (×12): qty 1

## 2021-11-27 MED ORDER — ENOXAPARIN SODIUM 60 MG/0.6ML IJ SOSY
50.0000 mg | PREFILLED_SYRINGE | INTRAMUSCULAR | Status: DC
  Administered 2021-11-27 – 2021-12-07 (×11): 50 mg via SUBCUTANEOUS
  Filled 2021-11-27 (×11): qty 0.6

## 2021-11-27 MED ORDER — NITROGLYCERIN 0.4 MG SL SUBL: 0.4000 mg | SUBLINGUAL_TABLET | SUBLINGUAL | Status: DC | PRN

## 2021-11-27 MED ORDER — VITAMIN B-12 1000 MCG PO TABS
1000.0000 ug | ORAL_TABLET | Freq: Every day | ORAL | Status: DC
  Administered 2021-11-28 – 2021-12-08 (×11): 1000 ug via ORAL
  Filled 2021-11-27 (×11): qty 1

## 2021-11-27 MED ORDER — INSULIN GLARGINE-YFGN 100 UNIT/ML ~~LOC~~ SOLN
54.0000 [IU] | Freq: Every day | SUBCUTANEOUS | Status: DC
  Administered 2021-11-28 – 2021-12-04 (×7): 54 [IU] via SUBCUTANEOUS
  Filled 2021-11-27 (×7): qty 0.54

## 2021-11-27 MED ORDER — ACETAMINOPHEN 325 MG PO TABS: 650.0000 mg | ORAL_TABLET | Freq: Four times a day (QID) | ORAL | Status: DC | PRN

## 2021-11-27 MED ORDER — CYCLOBENZAPRINE HCL 10 MG PO TABS
10.0000 mg | ORAL_TABLET | Freq: Every day | ORAL | Status: DC
  Administered 2021-11-27 – 2021-11-28 (×2): 10 mg via ORAL
  Filled 2021-11-27 (×2): qty 1

## 2021-11-27 MED ORDER — IPRATROPIUM-ALBUTEROL 0.5-2.5 (3) MG/3ML IN SOLN: 3.0000 mL | Freq: Four times a day (QID) | RESPIRATORY_TRACT | Status: DC | PRN

## 2021-11-27 MED ORDER — INSULIN ASPART 100 UNIT/ML IJ SOLN
0.0000 [IU] | Freq: Three times a day (TID) | INTRAMUSCULAR | Status: DC
  Administered 2021-11-27: 2 [IU] via SUBCUTANEOUS
  Administered 2021-11-28: 1 [IU] via SUBCUTANEOUS
  Administered 2021-11-30: 2 [IU] via SUBCUTANEOUS
  Administered 2021-12-01: 1 [IU] via SUBCUTANEOUS
  Administered 2021-12-01: 2 [IU] via SUBCUTANEOUS
  Administered 2021-12-02: 1 [IU] via SUBCUTANEOUS
  Administered 2021-12-02 – 2021-12-03 (×2): 2 [IU] via SUBCUTANEOUS
  Administered 2021-12-04 – 2021-12-05 (×2): 1 [IU] via SUBCUTANEOUS
  Administered 2021-12-06: 13:00:00 2 [IU] via SUBCUTANEOUS
  Administered 2021-12-06 – 2021-12-07 (×2): 1 [IU] via SUBCUTANEOUS

## 2021-11-27 MED ORDER — CLOPIDOGREL BISULFATE 75 MG PO TABS
75.0000 mg | ORAL_TABLET | Freq: Every day | ORAL | Status: DC
  Administered 2021-11-28 – 2021-12-08 (×11): 75 mg via ORAL
  Filled 2021-11-27 (×11): qty 1

## 2021-11-27 MED ORDER — SENNOSIDES-DOCUSATE SODIUM 8.6-50 MG PO TABS
2.0000 | ORAL_TABLET | Freq: Two times a day (BID) | ORAL | Status: DC
  Administered 2021-11-27 – 2021-12-08 (×21): 2 via ORAL
  Filled 2021-11-27 (×22): qty 2

## 2021-11-27 MED ORDER — ROSUVASTATIN CALCIUM 20 MG PO TABS
20.0000 mg | ORAL_TABLET | Freq: Every day | ORAL | Status: DC
  Administered 2021-11-28 – 2021-12-08 (×11): 20 mg via ORAL
  Filled 2021-11-27 (×11): qty 1

## 2021-11-27 MED ORDER — ONDANSETRON HCL 4 MG PO TABS: 4.0000 mg | ORAL_TABLET | Freq: Four times a day (QID) | ORAL | Status: DC | PRN

## 2021-11-27 MED ORDER — CARVEDILOL 6.25 MG PO TABS
6.2500 mg | ORAL_TABLET | Freq: Two times a day (BID) | ORAL | Status: DC
  Administered 2021-11-27 – 2021-12-08 (×22): 6.25 mg via ORAL
  Filled 2021-11-27 (×22): qty 1

## 2021-11-27 NOTE — Discharge Instructions (Signed)
Inpatient Rehab Discharge Instructions  Erika [name] Discharge date and time: No discharge date for patient encounter.   Activities/Precautions/ Functional Status: Activity: activity as tolerated Diet: regular diet Wound Care: Routine skin checks Functional status:  ___ No restrictions     ___ Walk up steps independently ___ 24/7 supervision/assistance   ___ Walk up steps with assistance ___ Intermittent supervision/assistance  ___ Bathe/dress independently ___ Walk with walker     __x_ Bathe/dress with assistance ___ Walk Independently    ___ Shower independently ___ Walk with assistance    ___ Shower with assistance ___ No alcohol     ___ Return to work/school ________  Special Instructions: NO driving smoking or alcohol   My questions have been answered and I understand these instructions. I will adhere to these goals and the provided educational materials after my discharge from the hospital.  Patient/Caregiver Signature _______________________________ Date __________  Clinician Signature _______________________________________ Date __________  Please bring this form and your medication list with you to all your follow-up doctor's appointments.

## 2021-11-27 NOTE — Discharge Summary (Signed)
Physician Discharge Summary  ZELINA JIMERSON SEG:[id number] DOB: 05-13-1971 DOA: 11/21/2021  PCP: Lesleigh Noe, MD  Admit date: 11/21/2021 Discharge date: 11/27/2021  Admitted From: Home Disposition: CIR  Recommendations for Outpatient Follow-up:  Follow up with PCP in 1-2 weeks Follow-up with orthopedics 2 weeks  Home Health: No Equipment/Devices: None  Discharge Condition: Stable CODE STATUS: Full Diet recommendation: Carb modified  Brief/Interim Summary:  GENESE QUEBEDEAUX is a 50 y.o. Caucasian female with medical history significant for coronary artery disease, carotid artery disease, diastolic dysfunction, depression, PAD and PTSD, who presented to the ER with acute onset of worsening cough over the last week and this area got significantly worse since yesterday She had an accidental mechanical fall 1 day prior to admission with subsequent left foot pain.  Seen in consultation by orthopedics.  Recommend Aircast and weightbearing as tolerated.  Hospital course complicated by acute respiratory failure secondary to community-acquired pneumonia.  Started on IV Levaquin.  Initially saturating 94% on 4 L.  Gradually improved.  Sats on room air 85%.  Completed a 5-day course of Levaquin.  Blood cultures negative.  Procalcitonin downtrending.  White count downtrending.  Fevers resolved.  On room air at time of discharge.  No further antibiotics indicated at time of discharge as patient is already completed a 5-day course for community-acquired pneumonia.  Stable for discharge to CIR.  We will follow-up with orthopedics postdischarge.  Patient was seen by psychiatry given husband's concerns about manipulative behavior and early onset cognitive decline.  MRI done did demonstrate some evidence of volume loss but no acute CVA, bleed, mass.  Psychiatry consultation appreciated.  No changes been made in outpatient psychiatric regimen.  Patient follows with Dr. Dossie Der outpatient psychiatry.   Recent video visit on 11/21.  I did speak to the husband prior to discharge.  I strongly recommended that the patient follow-up with Dr. Dossie Der post discharge and possibly request outpatient neuropsychiatric evaluation for objective evaluation of early onset cognitive decline.   Discharge Diagnoses:  Principal Problem:   CAP (community acquired pneumonia) Active Problems:   Depression   Ankle fracture, left   Closed avulsion fracture of medial malleolus of left tibia   Major depressive disorder, recurrent episode, mild (HCC)  Acute hypoxic respiratory failure secondary to community acquired pneumonia -- patient came in with increasing cough shortness of breath and briefly dropped her sats 85% on room air. Currently on 4 L nasal cannula oxygen was sats 94% Discharge plan: Completed 5 days of Levaquin in house.  No antibiotics indicated at time of discharge.  Blood cultures remain negative.  White count downtrending patient afebrile.  On room air at time of discharge.  Avulsion fracture of medial malleolus left foot status post mechanical fall at home Seen in consultation by orthopedic surgery.  No surgical intervention recommended.  Recommends Aircast and weightbearing as tolerated.  Seen by physical therapy in house.  Recommend CIR.  Discharge to CIR in stable condition.  Notified PT of need for Aircast.  They will verify order and reorder if necessary   history of CVA with chronic left upper extremity paresis -- continue aspirin Plavix -- continue statins --12/1--MRI brain done per husbands request--no acute abnormality.  Negative for CVA, bleed, mass.  Evidence of volume loss.  Recommend outpatient neuropsychiatric evaluation.   type II diabetes with hyperglycemia Can resume home regimen at time of discharge.  Recommend follow-up with primary care for further monitoring and titration glycemic regimen   coronary artery disease --continue  Imdur, prn  sublingual nitroglycerin, statin, ASA,  Plavix and Coreg.   H/o Depression/PTSD --pt follows with Dr Dossie Der Psych as out pt--recent video visit 11/13/21--changed to Lexapro --d/ced recently Prozac due to interaction with Plavix (per psych notes) --d/w husband who would like psych evaluation for "manipulative behavior" -- Psychiatry evaluation completed.  No changes in medication regimen recommended at this time.  Recommend outpatient neuropsychiatric evaluation for objective evaluation of early onset cognitive decline in the setting of CVA.  Discharge Instructions  Discharge Instructions     Diet Carb Modified   Complete by: As directed    Increase activity slowly   Complete by: As directed       Allergies as of 11/27/2021       Reactions   Chantix [varenicline Tartrate] Other (See Comments)   Feels "crazy" and angry        Medication List     STOP taking these medications    potassium chloride 20 MEQ packet Commonly known as: KLOR-CON   Vitamin D (Ergocalciferol) 1.25 MG (50000 UNIT) Caps capsule Commonly known as: DRISDOL       TAKE these medications    acetaminophen 325 MG tablet Commonly known as: TYLENOL Take 1-2 tablets (325-650 mg total) by mouth every 4 (four) hours as needed for mild pain.   albuterol 108 (90 Base) MCG/ACT inhaler Commonly known as: VENTOLIN HFA Inhale 2 puffs into the lungs every 6 (six) hours as needed for wheezing.   aspirin 81 MG chewable tablet Chew 1 tablet (81 mg total) by mouth daily.   benztropine 0.5 MG tablet Commonly known as: COGENTIN Take 1 tablet (0.5 mg total) by mouth at bedtime.   butalbital-acetaminophen-caffeine 50-325-40 MG tablet Commonly known as: FIORICET Take 1 tablet by mouth daily as needed for headache.   carvedilol 6.25 MG tablet Commonly known as: COREG Take 1 tablet (6.25 mg total) by mouth 2 (two) times daily.   chlorproMAZINE 100 MG tablet Commonly known as: THORAZINE TAKE 1 TABLET BY MOUTH EVERYDAY AT BEDTIME   clonazePAM 0.5 MG  tablet Commonly known as: KlonoPIN Take 1 tablet (0.5 mg total) by mouth 2 (two) times daily.   clopidogrel 75 MG tablet Commonly known as: PLAVIX Take 1 tablet (75 mg total) by mouth daily.   cyanocobalamin 1000 MCG tablet Take 1 tablet (1,000 mcg total) by mouth daily.   cyanocobalamin 1000 MCG/ML injection Commonly known as: (VITAMIN B-12) Inject 1 mL (1,000 mcg total) into the skin every 30 (thirty) days.   cyclobenzaprine 10 MG tablet Commonly known as: FLEXERIL Take 1 tablet (10 mg total) by mouth at bedtime.   Depend Adjustable Underwear Lg Misc 1 Units by Does not apply route daily.   Dexcom G6 Sensor Misc 1 Dose by Does not apply route daily.   Dexcom G6 Transmitter Misc 1 Units by Does not apply route daily.   escitalopram 20 MG tablet Commonly known as: Lexapro Take 1 tablet (20 mg total) by mouth daily.   ezetimibe 10 MG tablet Commonly known as: ZETIA Take 1 tablet (10 mg total) by mouth daily.   guaiFENesin 600 MG 12 hr tablet Commonly known as: MUCINEX Take 1 tablet (600 mg total) by mouth 2 (two) times daily.   HYDROcodone-acetaminophen 5-325 MG tablet Commonly known as: NORCO/VICODIN Take 1-2 tablets by mouth every 6 (six) hours as needed for up to 3 days for severe pain or moderate pain. IPR use only.  Refills to be considered by IPR physician   ibuprofen  400 MG tablet Commonly known as: ADVIL Take 1 tablet (400 mg total) by mouth every 8 (eight) hours as needed for moderate pain (severe pain).   icosapent Ethyl 1 g capsule Commonly known as: VASCEPA Take 2 capsules (2 g total) by mouth 2 (two) times daily.   insulin glargine-yfgn 100 UNIT/ML Pen Commonly known as: Semglee (yfgn) Inject 54 Units into the skin in the morning. And pen needles 1/day What changed:  how much to take when to take this   Insulin Syringe-Needle U-100 29G X 1/2" 0.3 ML Misc 1 each by Does not apply route 2 (two) times daily.   ipratropium-albuterol 0.5-2.5 (3)  MG/3ML Soln Commonly known as: DUONEB Take 3 mLs by nebulization every 6 (six) hours as needed.   isosorbide mononitrate 60 MG 24 hr tablet Commonly known as: IMDUR TAKE ONE TABLET BY MOUTH TWICE A DAY   lamoTRIgine 200 MG tablet Commonly known as: LAMICTAL Take 1 tablet (200 mg total) by mouth at bedtime.   nitroGLYCERIN 0.4 MG SL tablet Commonly known as: NITROSTAT Place 1 tablet (0.4 mg total) under the tongue every 5 (five) minutes as needed for chest pain.   ondansetron 4 MG tablet Commonly known as: ZOFRAN Take 1 tablet (4 mg total) by mouth daily as needed for nausea or vomiting.   pantoprazole 40 MG tablet Commonly known as: PROTONIX TAKE ONE TABLET BY MOUTH ONE TIME DAILY AT 12 AT NOON   polyethylene glycol powder 17 GM/SCOOP powder Commonly known as: GLYCOLAX/MIRALAX MIX 1 CAPFUL IN 8 OUNCES OF FLUID BY MOUTH TWICE A DAY   pregabalin 225 MG capsule Commonly known as: Lyrica Take 1 capsule (225 mg total) by mouth 2 (two) times daily.   Repatha SureClick 509 MG/ML Soaj Generic drug: Evolocumab Inject 1 Dose into the skin every 14 (fourteen) days.   rosuvastatin 20 MG tablet Commonly known as: CRESTOR Take 1 tablet (20 mg total) by mouth daily.   senna-docusate 8.6-50 MG tablet Commonly known as: Senokot-S Take 2 tablets by mouth 2 (two) times daily.   spironolactone 25 MG tablet Commonly known as: ALDACTONE TAKE ONE TABLET BY MOUTH ONE TIME DAILY   torsemide 10 MG tablet Commonly known as: DEMADEX Take 1 tablet (10 mg total) by mouth daily. PLEASE SCHEDULE OFFICE VISIT FOR FURTHER REFILLS. THANK YOU!   Trulicity 3.26 ZT/2.4PY Sopn Generic drug: Dulaglutide Inject 0.75 mg into the skin once a week.   UltiCare Mini Pen Needles 31G X 6 MM Misc Generic drug: Insulin Pen Needle        Allergies  Allergen Reactions   Chantix [Varenicline Tartrate] Other (See Comments)    Feels "crazy" and angry      Consultations: Orthopedics Psychiatry   Procedures/Studies: DG Tibia/Fibula Left  Result Date: 11/21/2021 CLINICAL DATA:  Ankle fracture EXAM: LEFT TIBIA AND FIBULA - 2 VIEW COMPARISON:  None. FINDINGS: There is no evidence of fracture or other focal bone lesions. Soft tissues are unremarkable. IMPRESSION: No acute fracture identified in the tibia or fibula. Electronically Signed   By: Ofilia Neas M.D.   On: 11/21/2021 19:15   DG Ankle Complete Left  Result Date: 11/21/2021 CLINICAL DATA:  LEFT ankle and knee pain EXAM: LEFT ANKLE COMPLETE - 3+ VIEW COMPARISON:  None. FINDINGS: Small avulsion fragment fragments inferior to the medial malleolus. Ankle mortise intact. Talar dome is normal. No joint effusion. IMPRESSION: Age-indeterminate avulsion fracture from the tip of the medial malleolus. Electronically Signed   By: Helane Gunther.D.  On: 11/21/2021 13:21   MR BRAIN WO CONTRAST  Result Date: 11/23/2021 CLINICAL DATA:  Transient ischemic attack EXAM: MRI HEAD WITHOUT CONTRAST TECHNIQUE: Multiplanar, multiecho pulse sequences of the brain and surrounding structures were obtained without intravenous contrast. COMPARISON:  06/27/2021 FINDINGS: Brain: No acute infarct, mass effect or extra-axial collection. No acute or chronic hemorrhage. Old right occipital and parietal cortical infarcts. Otherwise mild chronic small vessel disease. There is advanced chronic ischemic change within the brainstem and extending into the cerebellar peduncles. Mild generalized volume loss. The midline structures are normal. Vascular: Major flow voids are preserved. Skull and upper cervical spine: Normal calvarium and skull base. Visualized upper cervical spine and soft tissues are normal. Sinuses/Orbits:No paranasal sinus fluid levels or advanced mucosal thickening. No mastoid or middle ear effusion. Normal orbits. IMPRESSION: 1. No acute intracranial abnormality. 2. Old right occipital and parietal  cortical infarcts. 3. Advanced chronic ischemic change within the brainstem and cerebellar peduncles. Electronically Signed   By: Ulyses Jarred M.D.   On: 11/23/2021 23:42   DG Chest Portable 1 View  Result Date: 11/21/2021 CLINICAL DATA:  Shortness of breath EXAM: PORTABLE CHEST 1 VIEW COMPARISON:  09/08/2021 FINDINGS: Patchy opacities at the left greater than right lung bases. No pleural effusion. Stable cardiomediastinal contours. IMPRESSION: Patchy opacities at the left greater than right lung bases suspicious for pneumonia. Electronically Signed   By: Macy Mis M.D.   On: 11/21/2021 14:46   DG Knee Complete 4 Views Left  Result Date: 11/21/2021 CLINICAL DATA:  Left knee pain after fall. EXAM: LEFT KNEE - COMPLETE 4+ VIEW COMPARISON:  None. FINDINGS: No evidence of fracture, dislocation, or joint effusion. No evidence of arthropathy or other focal bone abnormality. Soft tissues are unremarkable. IMPRESSION: Negative. Electronically Signed   By: Marijo Conception M.D.   On: 11/21/2021 13:20      Subjective: Seen and examined at the day of discharge.  Patient stable, no distress.  Does endorse some cramping pain of left calf.  Otherwise stable.  No chest pain, shortness of breath, abdominal pain.  Patient tolerating p.o. without nausea or vomiting.  Mother-in-law at bedside.  Spoke to patient's husband via phone.  Discharge Exam: Vitals:   11/27/21 0409 11/27/21 0812  BP: (!) 172/88 (!) 147/85  Pulse: 89 72  Resp: 16 16  Temp: 97.8 F (36.6 C) 97.6 F (36.4 C)  SpO2: 90% 96%   Vitals:   11/26/21 2022 11/27/21 0006 11/27/21 0409 11/27/21 0812  BP: (!) 159/66 (!) 157/75 (!) 172/88 (!) 147/85  Pulse: 82 83 89 72  Resp: 18 14 16 16   Temp: 98.2 F (36.8 C) 98 F (36.7 C) 97.8 F (36.6 C) 97.6 F (36.4 C)  TempSrc: Oral Oral Oral Oral  SpO2: 90% 93% 90% 96%  Weight:      Height:        General: Pt is alert, awake, not in acute distress Cardiovascular: RRR, S1/S2 +, no  rubs, no gallops Respiratory: CTA bilaterally, no wheezing, no rhonchi Abdominal: Soft, NT, ND, bowel sounds + Extremities: Left lower extremity in brace.  Limited range of motion left ankle.  Good pedal pulses bilaterally.  Sensation and BLE mobility intact    The results of significant diagnostics from this hospitalization (including imaging, microbiology, ancillary and laboratory) are listed below for reference.     Microbiology: Recent Results (from the past 240 hour(s))  Blood Culture (routine x 2)     Status: None   Collection Time: 11/21/21  2:51 PM   Specimen: BLOOD  Result Value Ref Range Status   Specimen Description BLOOD BLOOD RIGHT HAND  Final   Special Requests   Final    BOTTLES DRAWN AEROBIC AND ANAEROBIC Blood Culture adequate volume   Culture   Final    NO GROWTH 5 DAYS Performed at [facility], [address] [place], [place] [address]    Report Status 11/26/2021 FINAL  Final  Blood Culture (routine x 2)     Status: None   Collection Time: 11/21/21  2:51 PM   Specimen: BLOOD  Result Value Ref Range Status   Specimen Description BLOOD RIGHT ANTECUBITAL  Final   Special Requests   Final    BOTTLES DRAWN AEROBIC AND ANAEROBIC Blood Culture adequate volume   Culture   Final    NO GROWTH 5 DAYS Performed at [facility], [address] [place], [place] [address]    Report Status 11/26/2021 FINAL  Final  Resp Panel by RT-PCR (Flu A&B, Covid) Urine, Clean Catch     Status: None   Collection Time: 11/21/21  2:51 PM   Specimen: Urine, Clean Catch; Nasopharyngeal(NP) swabs in vial transport medium  Result Value Ref Range Status   SARS Coronavirus 2 by RT PCR NEGATIVE NEGATIVE Final    Comment: (NOTE) SARS-CoV-2 target nucleic acids are NOT DETECTED.  The SARS-CoV-2 RNA is generally detectable in upper respiratory specimens during the acute phase of infection. The lowest concentration of SARS-CoV-2 viral copies this assay can detect is 138  copies/mL. A negative result does not preclude SARS-Cov-2 infection and should not be used as the sole basis for treatment or other patient management decisions. A negative result may occur with  improper specimen collection/handling, submission of specimen other than nasopharyngeal swab, presence of viral mutation(s) within the areas targeted by this assay, and inadequate number of viral copies(<138 copies/mL). A negative result must be combined with clinical observations, patient history, and epidemiological information. The expected result is Negative.  Fact Sheet for Patients:  EntrepreneurPulse.com.au  Fact Sheet for Healthcare Providers:  IncredibleEmployment.be  This test is no t yet approved or cleared by the [place] FDA and  has been authorized for detection and/or diagnosis of SARS-CoV-2 by FDA under an Emergency Use Authorization (EUA). This EUA will remain  in effect (meaning this test can be used) for the duration of the COVID-19 declaration under Section 564(b)(1) of the Act, 21 U.S.C.section 360bbb-3(b)(1), unless the authorization is terminated  or revoked sooner.       Influenza A by PCR NEGATIVE NEGATIVE Final   Influenza B by PCR NEGATIVE NEGATIVE Final    Comment: (NOTE) The Xpert Xpress SARS-CoV-2/FLU/RSV plus assay is intended as an aid in the diagnosis of influenza from Nasopharyngeal swab specimens and should not be used as a sole basis for treatment. Nasal washings and aspirates are unacceptable for Xpert Xpress SARS-CoV-2/FLU/RSV testing.  Fact Sheet for Patients: EntrepreneurPulse.com.au  Fact Sheet for Healthcare Providers: IncredibleEmployment.be  This test is not yet approved or cleared by the [place] FDA and has been authorized for detection and/or diagnosis of SARS-CoV-2 by FDA under an Emergency Use Authorization (EUA). This EUA will remain in effect (meaning  this test can be used) for the duration of the COVID-19 declaration under Section 564(b)(1) of the Act, 21 U.S.C. section 360bbb-3(b)(1), unless the authorization is terminated or revoked.  Performed at [facility], [address], [place], [place] [address]   Urine Culture  Status: Abnormal   Collection Time: 11/21/21  2:51 PM   Specimen: Urine, Random  Result Value Ref Range Status   Specimen Description   Final    URINE, RANDOM Performed at [facility], [address] [place], [place] [address]    Special Requests   Final    NONE Performed at [facility], [address] [place], [place] [address]    Culture (A)  Final    30,000 COLONIES/mL MULTIPLE SPECIES PRESENT, SUGGEST RECOLLECTION   Report Status 11/23/2021 FINAL  Final  MRSA Next Gen by PCR, Nasal     Status: None   Collection Time: 11/21/21  4:23 PM  Result Value Ref Range Status   MRSA by PCR Next Gen NOT DETECTED NOT DETECTED Final    Comment: (NOTE) The GeneXpert MRSA Assay (FDA approved for NASAL specimens only), is one component of a comprehensive MRSA colonization surveillance program. It is not intended to diagnose MRSA infection nor to guide or monitor treatment for MRSA infections. Test performance is not FDA approved in patients less than 38 years old. Performed at [facility], [address] [place], [place] [address]      Labs: BNP (last 3 results) Recent Labs    06/24/21 0540  BNP 73.2   Basic Metabolic Panel: Recent Labs  Lab 11/21/21 1138 11/22/21 0650  NA 134* 134*  K 3.7 3.5  CL 99 103  CO2 25 25  GLUCOSE 128* 124*  BUN 16 12  CREATININE 0.83 0.73  CALCIUM 9.5 9.2   Liver Function Tests: No results for input(s): AST, ALT, ALKPHOS, BILITOT, PROT, ALBUMIN in the last 168 hours. No results for input(s): LIPASE, AMYLASE in the last 168 hours. No results for input(s): AMMONIA in the last 168 hours. CBC: Recent Labs  Lab  11/21/21 1138 11/22/21 0650 11/24/21 0448 11/27/21 1007  WBC 19.8* 16.6* 9.0 9.0  NEUTROABS  --   --   --  5.7  HGB 13.4 11.3* 12.0 13.0  HCT 40.1 34.7* 37.3 39.8  MCV 84.2 84.2 84.4 84.7  PLT 328 302 360 404*   Cardiac Enzymes: No results for input(s): CKTOTAL, CKMB, CKMBINDEX, TROPONINI in the last 168 hours. BNP: Invalid input(s): POCBNP CBG: Recent Labs  Lab 11/25/21 2054 11/26/21 1148 11/26/21 1611 11/26/21 2017 11/27/21 0807  GLUCAP 99 116* 129* 104* 107*   D-Dimer No results for input(s): DDIMER in the last 72 hours. Hgb A1c No results for input(s): HGBA1C in the last 72 hours. Lipid Profile No results for input(s): CHOL, HDL, LDLCALC, TRIG, CHOLHDL, LDLDIRECT in the last 72 hours. Thyroid function studies No results for input(s): TSH, T4TOTAL, T3FREE, THYROIDAB in the last 72 hours.  Invalid input(s): FREET3 Anemia work up No results for input(s): VITAMINB12, FOLATE, FERRITIN, TIBC, IRON, RETICCTPCT in the last 72 hours. Urinalysis    Component Value Date/Time   COLORURINE YELLOW (A) 11/21/2021 1451   APPEARANCEUR CLEAR (A) 11/21/2021 1451   APPEARANCEUR Clear 09/11/2021 1413   LABSPEC 1.015 11/21/2021 1451   LABSPEC 1.024 01/11/2015 1022   PHURINE 7.0 11/21/2021 1451   GLUCOSEU NEGATIVE 11/21/2021 1451   GLUCOSEU >=1000 (A) 10/19/2019 1034   HGBUR LARGE (A) 11/21/2021 1451   BILIRUBINUR NEGATIVE 11/21/2021 1451   BILIRUBINUR Negative 09/11/2021 1413   BILIRUBINUR Negative 01/11/2015 [place] 11/21/2021 1451   PROTEINUR NEGATIVE 11/21/2021 1451   UROBILINOGEN 1.0 10/19/2019 1049   UROBILINOGEN 1.0 10/19/2019 1034   NITRITE NEGATIVE 11/21/2021 1451  LEUKOCYTESUR TRACE (A) 11/21/2021 1451   LEUKOCYTESUR Negative 01/11/2015 1022   Sepsis Labs Invalid input(s): PROCALCITONIN,  WBC,  LACTICIDVEN Microbiology Recent Results (from the past 240 hour(s))  Blood Culture (routine x 2)     Status: None   Collection Time: 11/21/21  2:51  PM   Specimen: BLOOD  Result Value Ref Range Status   Specimen Description BLOOD BLOOD RIGHT HAND  Final   Special Requests   Final    BOTTLES DRAWN AEROBIC AND ANAEROBIC Blood Culture adequate volume   Culture   Final    NO GROWTH 5 DAYS Performed at [facility], [address], [place], [place] [address]    Report Status 11/26/2021 FINAL  Final  Blood Culture (routine x 2)     Status: None   Collection Time: 11/21/21  2:51 PM   Specimen: BLOOD  Result Value Ref Range Status   Specimen Description BLOOD RIGHT ANTECUBITAL  Final   Special Requests   Final    BOTTLES DRAWN AEROBIC AND ANAEROBIC Blood Culture adequate volume   Culture   Final    NO GROWTH 5 DAYS Performed at [facility], [address] [place], [place] [address]    Report Status 11/26/2021 FINAL  Final  Resp Panel by RT-PCR (Flu A&B, Covid) Urine, Clean Catch     Status: None   Collection Time: 11/21/21  2:51 PM   Specimen: Urine, Clean Catch; Nasopharyngeal(NP) swabs in vial transport medium  Result Value Ref Range Status   SARS Coronavirus 2 by RT PCR NEGATIVE NEGATIVE Final    Comment: (NOTE) SARS-CoV-2 target nucleic acids are NOT DETECTED.  The SARS-CoV-2 RNA is generally detectable in upper respiratory specimens during the acute phase of infection. The lowest concentration of SARS-CoV-2 viral copies this assay can detect is 138 copies/mL. A negative result does not preclude SARS-Cov-2 infection and should not be used as the sole basis for treatment or other patient management decisions. A negative result may occur with  improper specimen collection/handling, submission of specimen other than nasopharyngeal swab, presence of viral mutation(s) within the areas targeted by this assay, and inadequate number of viral copies(<138 copies/mL). A negative result must be combined with clinical observations, patient history, and epidemiological information. The expected result is  Negative.  Fact Sheet for Patients:  EntrepreneurPulse.com.au  Fact Sheet for Healthcare Providers:  IncredibleEmployment.be  This test is no t yet approved or cleared by the [place] FDA and  has been authorized for detection and/or diagnosis of SARS-CoV-2 by FDA under an Emergency Use Authorization (EUA). This EUA will remain  in effect (meaning this test can be used) for the duration of the COVID-19 declaration under Section 564(b)(1) of the Act, 21 U.S.C.section 360bbb-3(b)(1), unless the authorization is terminated  or revoked sooner.       Influenza A by PCR NEGATIVE NEGATIVE Final   Influenza B by PCR NEGATIVE NEGATIVE Final    Comment: (NOTE) The Xpert Xpress SARS-CoV-2/FLU/RSV plus assay is intended as an aid in the diagnosis of influenza from Nasopharyngeal swab specimens and should not be used as a sole basis for treatment. Nasal washings and aspirates are unacceptable for Xpert Xpress SARS-CoV-2/FLU/RSV testing.  Fact Sheet for Patients: EntrepreneurPulse.com.au  Fact Sheet for Healthcare Providers: IncredibleEmployment.be  This test is not yet approved or cleared by the [place] FDA and has been authorized for detection and/or diagnosis of SARS-CoV-2 by FDA under an Emergency Use Authorization (EUA). This EUA will remain in effect (meaning  this test can be used) for the duration of the COVID-19 declaration under Section 564(b)(1) of the Act, 21 U.S.C. section 360bbb-3(b)(1), unless the authorization is terminated or revoked.  Performed at [facility], [address] [place], [place] [address]   Urine Culture     Status: Abnormal   Collection Time: 11/21/21  2:51 PM   Specimen: Urine, Random  Result Value Ref Range Status   Specimen Description   Final    URINE, RANDOM Performed at [facility], [address], [place], [place] [address]    Special  Requests   Final    NONE Performed at [facility], [address] [place], [place] [address]    Culture (A)  Final    30,000 COLONIES/mL MULTIPLE SPECIES PRESENT, SUGGEST RECOLLECTION   Report Status 11/23/2021 FINAL  Final  MRSA Next Gen by PCR, Nasal     Status: None   Collection Time: 11/21/21  4:23 PM  Result Value Ref Range Status   MRSA by PCR Next Gen NOT DETECTED NOT DETECTED Final    Comment: (NOTE) The GeneXpert MRSA Assay (FDA approved for NASAL specimens only), is one component of a comprehensive MRSA colonization surveillance program. It is not intended to diagnose MRSA infection nor to guide or monitor treatment for MRSA infections. Test performance is not FDA approved in patients less than 63 years old. Performed at [facility], [address], [place], [place] [address]      Time coordinating discharge: Over 30 minutes  SIGNED:   [name], MD  [facility] 11/27/2021, 10:47 AM Pager   If 7PM-7AM, please contact night-coverage

## 2021-11-27 NOTE — H&P (Incomplete)
Physical Medicine and Rehabilitation Admission H&P    Chief Complaint  Patient presents with   Weakness   Altered Mental Status  : HPI: Erika [name]. Vonruden is a 50 year old right-handed female with history of right pontine infarction maintained on Plavix and aspirin receiving inpatient rehab services 06/28/2021 - 08/01/2021, CAD status post CABG followed by cardiology services Dr. Jacqulyn Cane, multiple UTIs, right CEA x2, diabetes mellitus, fatty liver, chronic pain, PTSD maintained on Lexapro as well as Cogentin/Lamictal and Klonopin, diastolic congestive heart failure, hyperlipidemia, obesity with BMI 34.15, history of tobacco use.  Per chart review patient lives with spouse and daughter who is special needs.  1 level home ramped entrance.  Patient needed moderate assist for mobility using hemiwalker/minimal assist for ADLs as well as reported multiple falls in the past month.  Presented to [facility] 11/21/2021 with worsening cough x1 week.  Denied any fever or chills.  She had had a mechanical fall day prior to admission with left foot pain.  Husband had reported mental status changes.  In the ED blood pressure 143/83 and later 92/56.  Pulse O2 briefly dropped to 85% on room air rebounded to 96% on 3 L nasal cannula.  COVID-19 PCR negative.  UA showed 6-10 WBCs with rare bacteria.  EKG normal sinus rhythm.  Chest x-ray showed patchy opacities of the left greater than right lung base suspicious for pneumonia.  Left ankle film showed age-indeterminate avulsion fracture from the tip of the medial malleolus.  Left knee films negative.  MRI of the brain no acute intracranial abnormality.  Old right occipital and parietal cortical infarcts.  Admission chemistries lactic acid 2.1, blood cultures no growth to date, urine culture 30,000 multiple species, WBC 19,800,, procalcitonin level 19.24 with follow-up 6.74, sodium 134, glucose 128.  Patient was placed on broad-spectrum intravenous antibiotics transitioned to  Levaquin and completed course.  She was weaned from oxygen therapy.  Orthopedic services Dr.Poggi follow-up for avulsion fracture medial malleolus left foot conservative care Aircast applied weightbearing as tolerated.  Subcutaneous Lovenox for DVT prophylaxis.  Follow-up for psychiatry services for history of major depressive disorder/PTSD and patient remains on Cogentin/Klonopin/Lamictal.  Tolerating a regular consistency diet.  Therapy evaluations completed due to patient decreased functional mobility was admitted for a comprehensive rehab program.  Review of Systems  Constitutional:  Positive for malaise/fatigue. Negative for chills and fever.  HENT:  Negative for hearing loss.   Eyes:  Negative for blurred vision and double vision.  Respiratory:  Positive for cough. Negative for sputum production and wheezing.   Cardiovascular:  Positive for leg swelling. Negative for chest pain and palpitations.  Gastrointestinal:  Positive for constipation. Negative for heartburn, nausea and vomiting.  Genitourinary:  Positive for urgency. Negative for dysuria, flank pain and hematuria.  Musculoskeletal:  Positive for back pain, falls and myalgias.  Skin:  Negative for rash.  Psychiatric/Behavioral:  Positive for depression.        PTSD/anxiety  All other systems reviewed and are negative. Past Medical History:  Diagnosis Date   Arthralgia of temporomandibular joint    CAD, multiple vessel    a. 06/2016 Cath: ostLM 40%, ostLAD 40%, pLAD 95%, ost-pLCx 60%, pLCx 95%, mLCx 60%, mRCA 95%, D2 50%, LVSF nl;  b. 07/2016 CABG x 4 (LIMA->LAD, VG->Diag, VG->OM, VG->RCA); c. 08/2016 Cath: 3VD w/ 4/4 patent grafts. LAD distal to LIMA has diff dzs->Med rx; d. 08/2020 Cath: 4/4 patent grafts, native 3VD. EF 55-65%-->Med Rx.   Carotid arterial disease ([place])  a. 07/2016 s/p R CEA; b. 02/2021 U/S: RICA 52-84%, LICA 1-32%.   Clotting disorder ([place])    Depression    Diastolic dysfunction    a. 06/2016 Echo: EF 50-55%, mild  inf wall HK, GR1DD, mild MR, RV sys fxn nl, mildly dilated LA, PASP nl   Fatty liver disease, nonalcoholic 4401   History of blood transfusion    with heart surgery   HLD (hyperlipidemia)    Labile hypertension    a. prior renal ngiogram negative for RAS in 03/2016; b. catecholamines and metanephrines normal, mildly elevated renin with normal aldosterone and normal ratio in 02/2016   Myocardial infarction [facility]) 2017   Obesity    PAD (peripheral artery disease) ([place])    a. 09/2018 s/p L SFA stenting; b. 07/2019 Periph Angio: Patent m/d L SFA stent w/ 100% L SFA distal to stent. L AT 100d, L Peroneal diff dzs-->Med Rx; c. 02/2021 ABIs: stable @ 0.61 on R and 0.46 on L.   PTSD (post-traumatic stress disorder)    Tobacco abuse    Type 2 diabetes mellitus ([place]) 12/2015   Past Surgical History:  Procedure Laterality Date   ABDOMINAL AORTOGRAM W/LOWER EXTREMITY [name] 10/15/2018   Procedure: ABDOMINAL AORTOGRAM W/LOWER EXTREMITY;  Surgeon: Wellington Hampshire, MD;  Location: [place];  Service: Cardiovascular;  Laterality: [name];   ABDOMINAL AORTOGRAM W/LOWER EXTREMITY Bilateral 08/19/2019   Procedure: ABDOMINAL AORTOGRAM W/LOWER EXTREMITY;  Surgeon: Wellington Hampshire, MD;  Location: [place] [facility];  Service: Cardiovascular;  Laterality: Bilateral;   CARDIAC CATHETERIZATION [name] 06/29/2016   Procedure: Left Heart Cath and Coronary Angiography;  Surgeon: Minna Merritts, MD;  Location: [facility];  Service: Cardiovascular;  Laterality: [name];   CARDIAC CATHETERIZATION [name] 08/29/2016   Procedure: Left Heart Cath and Cors/Grafts Angiography;  Surgeon: Wellington Hampshire, MD;  Location: [place] [facility];  Service: Cardiovascular;  Laterality: [name];   CESAREAN SECTION     CHOLECYSTECTOMY     CORONARY ARTERY BYPASS GRAFT [name] 07/06/2016   Procedure: CORONARY ARTERY BYPASS GRAFTING (CABG) x four, using left internal mammary artery and right leg greater saphenous vein harvested endoscopically;   Surgeon: Ivin Poot, MD;  Location: [facility];  Service: Open Heart Surgery;  Laterality: [name];   ENDARTERECTOMY Right 07/06/2016   Procedure: ENDARTERECTOMY CAROTID;  Surgeon: Rosetta Posner, MD;  Location: [facility] OR;  Service: Vascular;  Laterality: Right;   ENDARTERECTOMY Right 04/27/2020   Procedure: REDO OF RIGHT ENDARTERECTOMY CAROTID;  Surgeon: Rosetta Posner, MD;  Location[facility];  Service: Vascular;  Laterality: Right;   LEFT HEART CATH AND CORS/GRAFTS ANGIOGRAPHY [name] 08/24/2020   Procedure: LEFT HEART CATH AND CORS/GRAFTS ANGIOGRAPHY;  Surgeon: Wellington Hampshire, MD;  Location: [place] [facility];  Service: Cardiovascular;  Laterality: [name];   PERIPHERAL VASCULAR CATHETERIZATION [name] 04/18/2016   Procedure: Renal Angiography;  Surgeon: Wellington Hampshire, MD;  Location: [facility];  Service: Cardiovascular;  Laterality: [name];   PERIPHERAL VASCULAR INTERVENTION Left 10/15/2018   Procedure: PERIPHERAL VASCULAR INTERVENTION;  Surgeon: Wellington Hampshire, MD;  Location: [place] [facility];  Service: Cardiovascular;  Laterality: Left;  Left superficial femoral   TEE WITHOUT CARDIOVERSION [name] 07/06/2016   Procedure: TRANSESOPHAGEAL ECHOCARDIOGRAM (TEE);  Surgeon: Ivin Poot, MD;  Location: [facility];  Service: Open Heart Surgery;  Laterality: [name];   TONSILLECTOMY     Family History  Adopted: Yes  Problem Relation Age of Onset   Diabetes  Mother    Diabetes Father    Alcohol abuse Father    Heart disease Father    Drug abuse Father    Stroke Sister    Anxiety disorder Sister    Social History:  reports that she has quit smoking. Her smoking use included cigarettes. She has a 13.50 pack-year smoking history. She has never used smokeless tobacco. She reports that she does not currently use alcohol. She reports that she does not use drugs. Allergies:  Allergies  Allergen Reactions   Chantix [Varenicline Tartrate] Other (See Comments)    Feels "crazy" and angry    Medications Prior to Admission   Medication Sig Dispense Refill   acetaminophen (TYLENOL) 325 MG tablet Take 1-2 tablets (325-650 mg total) by mouth every 4 (four) hours as needed for mild pain.     albuterol (PROVENTIL HFA;VENTOLIN HFA) 108 (90 Base) MCG/ACT inhaler Inhale 2 puffs into the lungs every 6 (six) hours as needed for wheezing. 1 Inhaler 0   aspirin 81 MG chewable tablet Chew 1 tablet (81 mg total) by mouth daily.     benztropine (COGENTIN) 0.5 MG tablet Take 1 tablet (0.5 mg total) by mouth at bedtime. 90 tablet 0   butalbital-acetaminophen-caffeine (FIORICET) 50-325-40 MG tablet Take 1 tablet by mouth daily as needed for headache. 30 tablet 0   carvedilol (COREG) 6.25 MG tablet Take 1 tablet (6.25 mg total) by mouth 2 (two) times daily. 180 tablet 1   chlorproMAZINE (THORAZINE) 100 MG tablet TAKE 1 TABLET BY MOUTH EVERYDAY AT BEDTIME 30 tablet 2   clonazePAM (KLONOPIN) 0.5 MG tablet Take 1 tablet (0.5 mg total) by mouth 2 (two) times daily. 60 tablet 2   clopidogrel (PLAVIX) 75 MG tablet Take 1 tablet (75 mg total) by mouth daily. 30 tablet 2   cyanocobalamin (,VITAMIN B-12,) 1000 MCG/ML injection Inject 1 mL (1,000 mcg total) into the skin every 30 (thirty) days. 1 mL 0   cyclobenzaprine (FLEXERIL) 10 MG tablet Take 1 tablet (10 mg total) by mouth at bedtime. 30 tablet 0   Dulaglutide (TRULICITY) 6.57 QI/6.9GE SOPN Inject 0.75 mg into the skin once a week. 6 mL 3   escitalopram (LEXAPRO) 20 MG tablet Take 1 tablet (20 mg total) by mouth daily. 30 tablet 1   Evolocumab (REPATHA SURECLICK) 952 MG/ML SOAJ Inject 1 Dose into the skin every 14 (fourteen) days. 6 mL 3   ezetimibe (ZETIA) 10 MG tablet Take 1 tablet (10 mg total) by mouth daily. 30 tablet 2   guaiFENesin (MUCINEX) 600 MG 12 hr tablet Take 1 tablet (600 mg total) by mouth 2 (two) times daily. 60 tablet 0   icosapent Ethyl (VASCEPA) 1 g capsule Take 2 capsules (2 g total) by mouth 2 (two) times daily. 360 capsule 3   insulin glargine-yfgn (SEMGLEE, YFGN,)  100 UNIT/ML Pen Inject 54 Units into the skin in the morning. And pen needles 1/day (Patient taking differently: Inject 27 Units into the skin 2 (two) times daily. And pen needles 1/day) 60 mL 3   isosorbide mononitrate (IMDUR) 60 MG 24 hr tablet TAKE ONE TABLET BY MOUTH TWICE A DAY 60 tablet 2   lamoTRIgine (LAMICTAL) 200 MG tablet Take 1 tablet (200 mg total) by mouth at bedtime. 90 tablet 0   nitroGLYCERIN (NITROSTAT) 0.4 MG SL tablet Place 1 tablet (0.4 mg total) under the tongue every 5 (five) minutes as needed for chest pain. 25 tablet 1   ondansetron (ZOFRAN) 4 MG tablet Take 1  tablet (4 mg total) by mouth daily as needed for nausea or vomiting. 20 tablet 11   pantoprazole (PROTONIX) 40 MG tablet TAKE ONE TABLET BY MOUTH ONE TIME DAILY AT 12 AT NOON 30 tablet 0   polyethylene glycol powder (GLYCOLAX/MIRALAX) 17 GM/SCOOP powder MIX 1 CAPFUL IN 8 OUNCES OF FLUID BY MOUTH TWICE A DAY 238 g 0   pregabalin (LYRICA) 225 MG capsule Take 1 capsule (225 mg total) by mouth 2 (two) times daily. 60 capsule 2   rosuvastatin (CRESTOR) 20 MG tablet Take 1 tablet (20 mg total) by mouth daily. 30 tablet 0   senna-docusate (SENOKOT-S) 8.6-50 MG tablet Take 2 tablets by mouth 2 (two) times daily. 120 tablet 0   spironolactone (ALDACTONE) 25 MG tablet TAKE ONE TABLET BY MOUTH ONE TIME DAILY 30 tablet 0   vitamin B-12 1000 MCG tablet Take 1 tablet (1,000 mcg total) by mouth daily.     Continuous Blood Gluc Sensor (DEXCOM G6 SENSOR) MISC 1 Dose by Does not apply route daily. 30 each 3   Continuous Blood Gluc Transmit (DEXCOM G6 TRANSMITTER) MISC 1 Units by Does not apply route daily. 1 each 3   Incontinence Supply Disposable (DEPEND ADJUSTABLE UNDERWEAR LG) MISC 1 Units by Does not apply route daily. 30 each 3   Insulin Syringe-Needle U-100 29G X 1/2" 0.3 ML MISC 1 each by Does not apply route 2 (two) times daily. 30 each 0   potassium chloride (KLOR-CON) 20 MEQ packet Take 20 mEq by mouth daily. (Patient not  taking: Reported on 11/21/2021) 30 packet 0   torsemide (DEMADEX) 10 MG tablet Take 1 tablet (10 mg total) by mouth daily. PLEASE SCHEDULE OFFICE VISIT FOR FURTHER REFILLS. THANK YOU! 30 tablet 0   ULTICARE MINI PEN NEEDLES 31G X 6 MM MISC      Vitamin D, Ergocalciferol, (DRISDOL) 1.25 MG (50000 UNIT) CAPS capsule Take 1 capsule (50,000 Units total) by mouth every 7 (seven) days. (Patient not taking: Reported on 11/21/2021) 5 capsule 0    Drug Regimen Review Drug regimen was reviewed and remains appropriate with no significant issues identified  Home: Home Living Family/patient expects to be discharged to:: Private residence Living Arrangements: Spouse/significant other, Children (Husband and daughter who is special needs) Available Help at Discharge: Family, Available PRN/intermittently (husband (works) and [employer]) Type of Home: House Home Access: Ramped entrance [place]: One level Bathroom Shower/Tub: Chiropodist: Standard Bathroom Accessibility: Yes Home Equipment: Tub bench, Wheelchair - manual, Radio producer - single point, Other (comment), Rolling Walker (2 wheels)   Functional History: Prior Function Prior Level of Function : Needs assist, History of Falls (last six months) Physical Assist : Mobility (physical), ADLs (physical) Mobility (physical): Transfers, Gait, Stairs ADLs (physical): Bathing, Dressing Mobility Comments: Husband reports pt needed mod to max A with all mobility, used hemi walker or RW for ambulation, 7 or 8 falls in the past month ADLs Comments: Husband reports pt needed min to mod A with all ADLs, stated they did stand pivot transfers to tub bench  Functional Status:  Mobility: Bed Mobility Overal bed mobility: Needs Assistance Bed Mobility: Supine to Sit Supine to sit: Min assist, HOB elevated General bed mobility comments: Pt received/left sitting in chair. Transfers Overall transfer level: Needs assistance Equipment used: Hemi-walker,  1 person hand held assist Transfers: Sit to/from Stand Sit to Stand: Mod assist Bed to/from chair/wheelchair/BSC transfer type:: Lateral/scoot transfer Stand pivot transfers: Mod assist, Max assist  Lateral/Scoot Transfers: Supervision General transfer  comment: Mod assist with STS +2 (hemi-walker and HHA), supervision for lateral scoot and showed imrpovement from previous sessions Ambulation/Gait Ambulation/Gait assistance: Mod assist, +2 physical assistance Gait Distance (Feet): 3 Feet Assistive device: Hemi-walker, 1 person hand held assist Gait Pattern/deviations: Step-to pattern, Shuffle, Decreased step length - right, Decreased step length - left, Decreased stride length General Gait Details: Mod assist due to LLE injury and LUE hemiplegia, only could take lateral shuffle steps at EOB and unable to take steps forward, scoot transfer from EOB to recliner, mod cuing for foot placement Gait velocity: decreased    ADL: ADL Overall ADL's : Needs assistance/impaired General ADL Comments: MOD A + hemi walker for functional mobility, STS 5X.  Cognition: Cognition Overall Cognitive Status: History of cognitive impairments - at baseline Orientation Level: Oriented X4 Cognition Arousal/Alertness: Awake/alert Behavior During Therapy: WFL for tasks assessed/performed Overall Cognitive Status: History of cognitive impairments - at baseline General Comments: spouse endorses that pt's cognition has been somewhat off since stroke in July 2022. She presents this date basically oriented (x3-self, place, month), but seemingly flat with some tasks/somewhat difficult to cue or ascertain how well pt understood. She follows most basic commands, difficulty with higher level cognitive processing.  Physical Exam: Blood pressure (!) 147/85, pulse 72, temperature 97.6 F (36.4 C), temperature source Oral, resp. rate 16, height 5\' 7"  (1.702 m), weight 98.9 kg, SpO2 96 %. Physical Exam Neurological:      Comments: Patient is alert.  No acute distress.  Makes eye contact with examiner.  Provides name and age.  Follows simple commands.  Limited but fair medical historian.    Results for orders placed or performed during the hospital encounter of 11/21/21 (from the past 48 hour(s))  Glucose, capillary     Status: Abnormal   Collection Time: 11/25/21 11:28 AM  Result Value Ref Range   Glucose-Capillary 116 (H) 70 - 99 mg/dL    Comment: Glucose reference range applies only to samples taken after fasting for at least 8 hours.  Glucose, capillary     Status: Abnormal   Collection Time: 11/25/21  3:30 PM  Result Value Ref Range   Glucose-Capillary 107 (H) 70 - 99 mg/dL    Comment: Glucose reference range applies only to samples taken after fasting for at least 8 hours.  Glucose, capillary     Status: None   Collection Time: 11/25/21  8:54 PM  Result Value Ref Range   Glucose-Capillary 99 70 - 99 mg/dL    Comment: Glucose reference range applies only to samples taken after fasting for at least 8 hours.  Glucose, capillary     Status: Abnormal   Collection Time: 11/26/21 11:48 AM  Result Value Ref Range   Glucose-Capillary 116 (H) 70 - 99 mg/dL    Comment: Glucose reference range applies only to samples taken after fasting for at least 8 hours.  Glucose, capillary     Status: Abnormal   Collection Time: 11/26/21  4:11 PM  Result Value Ref Range   Glucose-Capillary 129 (H) 70 - 99 mg/dL    Comment: Glucose reference range applies only to samples taken after fasting for at least 8 hours.  Glucose, capillary     Status: Abnormal   Collection Time: 11/26/21  8:17 PM  Result Value Ref Range   Glucose-Capillary 104 (H) 70 - 99 mg/dL    Comment: Glucose reference range applies only to samples taken after fasting for at least 8 hours.  Glucose, capillary  Status: Abnormal   Collection Time: 11/27/21  8:07 AM  Result Value Ref Range   Glucose-Capillary 107 (H) 70 - 99 mg/dL    Comment: Glucose  reference range applies only to samples taken after fasting for at least 8 hours.   *Note: Due to a large number of results and/or encounters for the requested time period, some results have not been displayed. A complete set of results can be found in Results Review.   No results found.     Medical Problem List and Plan: 1.  Debility functional deficits secondary to acute hypoxic respiratory failure/community-acquired pneumonia.  Antibiotic therapy completed.  Wean oxygen to room air  -patient may *** shower  -ELOS/Goals: *** 2.  Antithrombotics: -DVT/anticoagulation:  Pharmaceutical: Lovenox  -antiplatelet therapy: Aspirin 81 mg daily, Plavix 75 mg daily 3. Pain Management/chronic pain: Lyrica 225 mg twice daily, hydrocodone as needed, Flexeril 10 mg nightly, Advil as needed 4. Mood/PTSD: Lexapro 20 mg daily, Lamictal 200 mg nightly, Thorazine 100 mg nightly, Cogentin 0.5 mg nightly, Klonopin 0.5 mg twice daily  -antipsychotic agents: [name] 5. Neuropsych: This patient is capable of making decisions on her own behalf. 6. Skin/Wound Care: Routine skin checks 7. Fluids/Electrolytes/Nutrition: Routine in and outs with follow-up chemistries 8.  Avulsion fracture medial malleolus left foot status post fall.  Conservative care orthopedic service Dr. Roland Rack.  Aircast as needed.  Weightbearing as tolerated 9.  History of right pontine infarction.  Patient received CIR July 2022.  Continue aspirin and Plavix 10.  CAD/CABG.  No chest pain or shortness of breath.  Continue Plavix/aspirin 11.  Hypertension.  Coreg 6.25 mg twice daily, Imdur 60 mg twice daily, Aldactone 25 mg daily, Demadex 10 mg daily.  Monitor with increased mobility 12.  Diabetes mellitus.  Hemoglobin A1c 7.0.  Semglee 54 units daily.  Check blood sugars before meals and at bedtime 13.  Diastolic congestive heart failure.  Continue Demadex 10 mg daily.  Monitor for any signs of fluid overload 14.  Hyperlipidemia.  Crestor/Zetia 15.   GERD.  Protonix 16.  Constipation.  MiraLAX twice daily, Senokot S2 tablets twice daily Cathlyn Parsons, PA-C 11/27/2021

## 2021-11-27 NOTE — Progress Notes (Addendum)
Inpatient Rehabilitation Admission Medication Review by a Pharmacist  A complete drug regimen review was completed for this patient to identify any potential clinically significant medication issues.  High Risk Drug Classes Is patient taking? Indication by Medication  Antipsychotic Yes Lamictal/thorazine/congentin - PTSD  Anticoagulant Yes Lovenox - DVT px  Antibiotic No   Opioid Yes Vidocdin - pain  Antiplatelet Yes ASA/plavix CAD/CVA  Hypoglycemics/insulin Yes Semglee/SSI DM  Vasoactive Medication Yes Imdur/aldactone/torsemide/coreg - CAD/HTN  Chemotherapy No   Other Yes Rosuvastatin/zetia - HLD/CVA Vit D - defiency Lyrica - pain Vascepa - high triglycerides     Type of Medication Issue Identified Description of Issue Recommendation(s)  Drug Interaction(s) (clinically significant)     Duplicate Therapy     Allergy     No Medication Administration End Date     Incorrect Dose     Additional Drug Therapy Needed  Fiorcet, repatha injection, trulicity on the dc summary but didn't get prior to tx Will clarify with [name] Angiulli  Significant med changes from prior encounter (inform family/care partners about these prior to discharge).    Other  Dc summary said stop weekly vitamin D Will clarify with Dan Angiulli    Clinically significant medication issues were identified that warrant physician communication and completion of prescribed/recommended actions by midnight of the next day:  Yes  Name of provider notified for urgent issues identified: Marlowe Shores, PA  Provider Method of Notification: secure chat    Pharmacist comments:   Time spent performing this drug regimen review (minutes):  [place], PharmD, [place], AAHIVP, CPP Infectious Disease Pharmacist 11/27/2021 3:24 PM

## 2021-11-27 NOTE — Progress Notes (Signed)
Inpatient Rehab Admissions Coordinator:   I have a bed for this patient to admit to CIR today.  Dr. Priscella Mann is in agreement and [facility] aware.  I will let pt/family know.  I will arrange [facility] transport once we have a ready bed.    Shann Medal, PT, DPT Admissions Coordinator [phone] 11/27/21  10:20 AM

## 2021-11-27 NOTE — PMR Pre-admission (Signed)
PMR Admission Coordinator Pre-Admission Assessment  Patient: Erika [name] is an 50 y.o., female MRN: [id number] DOB: May 21, 1971 Height: _0  (170.2 cm) Weight: 98.9 kg  Insurance Information HMO: yes    PPO:      PCP:      IPA:      80/20:      OTHER:  PRIMARY: [employer]      Policy#: [id number]      Subscriber: pt CM Name: Butch Penny      Phone#: [phone] ext [phone]     Fax#: [phone] Pre-Cert#: [id number] Josem Kaufmann for CIR from Butch Penny with updates due to [place] at [phone] (phone [phone] ext [phone])      Employer:  Benefits:  Phone #: [phone]     Name:  Eff. Date: 12/25/19     Deduct: $1250 (met)      Out of Pocket Max: $4500 (met)      Life Max: n/a CIR: 80%      SNF: 80% Outpatient:      Co-Pay: $50/visit Home Health: 80%      Co-Pay: 20% DME: 100%     Co-Pay:  Providers:  SECONDARY: Medicare Part A       Policy#:      Phone#:   Financial Counselor:       Phone#:   The "Data Collection Information Summary" for patients in Inpatient Rehabilitation Facilities with attached "Privacy Act [place] Records" was provided and verbally reviewed with: Patient/Family  Emergency Contact Information Contact Information     Name Relation Home Work Mobile   [place] Spouse  [phone]   [facility] Mother   [phone]       Current Medical History  Patient Admitting Diagnosis: debility  History of Present Illness: Erika [name] is a 50 year old right-handed female with history of right pontine infarction maintained on Plavix and aspirin receiving inpatient rehab services 06/28/2021 - 08/01/2021, CAD status post CABG followed by cardiology services Dr. Jacqulyn Cane, multiple UTIs, right CEA x2, diabetes mellitus, fatty liver, chronic pain, PTSD maintained on Lexapro as well as Cogentin/Lamictal and Klonopin, diastolic congestive heart failure, hyperlipidemia, obesity with BMI 34.15, history of tobacco use. Presented to [facility]21/2021 with worsening cough x1 week.  Denied any fever or chills.  She had had a mechanical fall day prior to admission with left foot pain.  Husband had reported mental status changes.  In the ED blood pressure 143/83 and later 92/56.  Pulse O2 briefly dropped to 85% on room air rebounded to 96% on 3 L nasal cannula.  COVID-19 PCR negative.  UA showed 6-10 WBCs with rare bacteria.  EKG normal sinus rhythm.  Chest x-ray showed patchy opacities of the left greater than right lung base suspicious for pneumonia.  Left ankle film showed age-indeterminate avulsion fracture from the tip of the medial malleolus.  Left knee films negative.  MRI of the brain no acute intracranial abnormality.  Old right occipital and parietal cortical infarcts.  Admission chemistries lactic acid 2.1, blood cultures no growth to date, urine culture 30,000 multiple species, WBC 19,800,, procalcitonin level 19.24 with follow-up 6.74, sodium 134, glucose 128.  Patient was placed on broad-spectrum intravenous antibiotics transitioned to Levaquin and completed course.  She was weaned from oxygen therapy.  Orthopedic services Dr.Poggi follow-up for avulsion fracture medial malleolus left foot conservative care Aircast applied weightbearing as tolerated.  Subcutaneous Lovenox for DVT prophylaxis.  Follow-up for psychiatry services for history of major depressive disorder/PTSD and patient remains on Cogentin/Klonopin/Lamictal.  Follow-up for psychiatry services for history of major depressive disorder/PTSD and patient remains on Cogentin/Klonopin/Lamictal.  Tolerating a regular consistency diet.  Therapy evaluations completed due to patient decreased functional mobility was recommended for a comprehensive rehab progra   Patient's medical record from [place] has been reviewed by the rehabilitation admission coordinator and physician.   Past Medical History      Past Medical History:  Diagnosis Date   Arthralgia of temporomandibular joint     CAD, multiple vessel      a. 06/2016 Cath: ostLM 40%, ostLAD 40%, pLAD 95%, ost-pLCx 60%, pLCx 95%, mLCx 60%, mRCA  95%, D2 50%, LVSF nl;  b. 07/2016 CABG x 4 (LIMA->LAD, VG->Diag, VG->OM, VG->RCA); c. 08/2016 Cath: 3VD w/ 4/4 patent grafts. LAD distal to LIMA has diff dzs->Med rx; d. 08/2020 Cath: 4/4 patent grafts, native 3VD. EF 55-65%-->Med Rx.   Carotid arterial disease (HCC)      a. 07/2016 s/p R CEA; b. 02/2021 U/S: RICA 40-59%, LICA 1-39%.   Clotting disorder (HCC)     Depression     Diastolic dysfunction      a. 06/2016 Echo: EF 50-55%, mild inf wall HK, GR1DD, mild MR, RV sys fxn nl, mildly dilated LA, PASP nl   Fatty liver disease, nonalcoholic 2016   History of blood transfusion      with heart surgery   HLD (hyperlipidemia)     Labile hypertension      a. prior renal ngiogram negative for RAS in 03/2016; b. catecholamines and metanephrines normal, mildly elevated renin with normal aldosterone and normal ratio in 02/2016   Myocardial infarction (HCC) 2017   Obesity     PAD (peripheral artery disease) (HCC)      a. 09/2018 s/p L SFA stenting; b. 07/2019 Periph Angio: Patent m/d L SFA stent w/ 100% L SFA distal to stent. L AT 100d, L Peroneal diff dzs-->Med Rx; c. 02/2021 ABIs: stable @ 0.61 on R and 0.46 on L.   PTSD (post-traumatic stress disorder)     Tobacco abuse     Type 2 diabetes mellitus (HCC) 12/2015      Has the patient had major surgery during 100 days prior to admission? No   Family History   family history includes Alcohol abuse in her father; Anxiety disorder in her sister; Diabetes in her father and mother; Drug abuse in her father; Heart disease in her father; Stroke in her sister. She was adopted.   Current Medications   Current Facility-Administered Medications:    acetaminophen (TYLENOL) tablet 650 mg, 650 mg, Oral, Q6H PRN **OR** acetaminophen (TYLENOL) suppository 650 mg, 650 mg, Rectal, Q6H PRN, Mansy, Jan A, MD   albuterol (PROVENTIL) (2.5 MG/3ML) 0.083% nebulizer solution 2.5 mg, 2.5 mg, Nebulization, Q6H PRN, Mansy, Jan A, MD   aspirin chewable tablet 81 mg, 81 mg, Oral,  Daily, Mansy, Jan A, MD, 81 mg at 11/27/21 0844   benztropine (COGENTIN) tablet 0.5 mg, 0.5 mg, Oral, QHS, Mansy, Jan A, MD, 0.5 mg at 11/26/21 2110   carvedilol (COREG) tablet 6.25 mg, 6.25 mg, Oral, BID, Mansy, Jan A, MD, 6.25 mg at 11/27/21 0845   chlorpheniramine-HYDROcodone (TUSSIONEX) 10-8 MG/5ML suspension 5 mL, 5 mL, Oral, Q12H PRN, Mansy, Jan A, MD   chlorproMAZINE (THORAZINE) tablet 100 mg, 100 mg, Oral, QHS, Mansy, Jan A, MD, 100 mg at 11/26/21 2110   clonazePAM (KLONOPIN) tablet 0.5 mg, 0.5 mg, Oral, BID, Mansy, Jan A, MD, 0.5 mg at 11/27/21 0845     clopidogrel (PLAVIX) tablet 75 mg, 75 mg, Oral, Daily, Mansy, Jan A, MD, 75 mg at 11/27/21 0845   cyanocobalamin ((VITAMIN B-12)) injection 1,000 mcg, 1,000 mcg, Subcutaneous, Q30 days, Mansy, Jan A, MD   cyclobenzaprine (FLEXERIL) tablet 10 mg, 10 mg, Oral, QHS, Mansy, Jan A, MD, 10 mg at 11/26/21 2110   cyclobenzaprine (FLEXERIL) tablet 10 mg, 10 mg, Oral, Once, Sreenath, Sudheer B, MD   enoxaparin (LOVENOX) injection 50 mg, 0.5 mg/kg, Subcutaneous, Q24H, Mansy, Jan A, MD, 50 mg at 11/26/21 2111   escitalopram (LEXAPRO) tablet 20 mg, 20 mg, Oral, Daily, Mansy, Jan A, MD, 20 mg at 11/27/21 0845   ezetimibe (ZETIA) tablet 10 mg, 10 mg, Oral, Daily, Mansy, Jan A, MD, 10 mg at 11/27/21 0845   guaiFENesin (MUCINEX) 12 hr tablet 600 mg, 600 mg, Oral, BID, Mansy, Jan A, MD, 600 mg at 11/27/21 0844   hydrALAZINE (APRESOLINE) injection 5 mg, 5 mg, Intravenous, Q4H PRN, Kakrakandy, Arshad N, MD, 5 mg at 11/26/21 0002   HYDROcodone-acetaminophen (NORCO/VICODIN) 5-325 MG per tablet 1-2 tablet, 1-2 tablet, Oral, Q6H PRN, Patel, Sona, MD, 2 tablet at 11/27/21 0846   ibuprofen (ADVIL) tablet 400 mg, 400 mg, Oral, Q8H PRN, Patel, Sona, MD   icosapent Ethyl (VASCEPA) 1 g capsule 2 g, 2 g, Oral, BID, Mansy, Jan A, MD, 2 g at 11/27/21 0844   insulin aspart (novoLOG) injection 0-9 Units, 0-9 Units, Subcutaneous, TID AC & HS, Mansy, Jan A, MD, 1 Units at  11/26/21 1723   insulin glargine-yfgn (SEMGLEE) injection 54 Units, 54 Units, Subcutaneous, Daily, Mansy, Jan A, MD, 54 Units at 11/27/21 0843   ipratropium-albuterol (DUONEB) 0.5-2.5 (3) MG/3ML nebulizer solution 3 mL, 3 mL, Nebulization, QID, Mansy, Jan A, MD, 3 mL at 11/27/21 0720   isosorbide mononitrate (IMDUR) 24 hr tablet 60 mg, 60 mg, Oral, BID, Mansy, Jan A, MD, 60 mg at 11/27/21 0845   lamoTRIgine (LAMICTAL) tablet 200 mg, 200 mg, Oral, QHS, Mansy, Jan A, MD, 200 mg at 11/26/21 2116   magnesium hydroxide (MILK OF MAGNESIA) suspension 30 mL, 30 mL, Oral, Daily PRN, Mansy, Jan A, MD   morphine 2 MG/ML injection 1 mg, 1 mg, Intravenous, Q8H PRN, Patel, Sona, MD, 1 mg at 11/25/21 1445   nitroGLYCERIN (NITROSTAT) SL tablet 0.4 mg, 0.4 mg, Sublingual, Q5 min PRN, Mansy, Jan A, MD   ondansetron (ZOFRAN) tablet 4 mg, 4 mg, Oral, Q6H PRN **OR** ondansetron (ZOFRAN) injection 4 mg, 4 mg, Intravenous, Q6H PRN, Mansy, Jan A, MD   ondansetron (ZOFRAN) tablet 4 mg, 4 mg, Oral, Daily PRN, Mansy, Jan A, MD   pantoprazole (PROTONIX) EC tablet 40 mg, 40 mg, Oral, Daily, Mansy, Jan A, MD, 40 mg at 11/27/21 0845   polyethylene glycol (MIRALAX / GLYCOLAX) packet 17 g, 17 g, Oral, BID, Mansy, Jan A, MD   potassium chloride (KLOR-CON) packet 20 mEq, 20 mEq, Oral, Daily, Mansy, Jan A, MD, 20 mEq at 11/27/21 0844   pregabalin (LYRICA) capsule 225 mg, 225 mg, Oral, BID, Mansy, Jan A, MD, 225 mg at 11/27/21 0846   rosuvastatin (CRESTOR) tablet 20 mg, 20 mg, Oral, Daily, Mansy, Jan A, MD, 20 mg at 11/27/21 0844   senna-docusate (Senokot-S) tablet 2 tablet, 2 tablet, Oral, BID, Mansy, Jan A, MD, 2 tablet at 11/27/21 0845   spironolactone (ALDACTONE) tablet 25 mg, 25 mg, Oral, Daily, Mansy, Jan A, MD, 25 mg at 11/27/21 0845   torsemide (DEMADEX) tablet 10 mg, 10 mg, Oral, Daily, Patel,   MD, 1,000 mcg at 11/27/21 0845   Vitamin D (Ergocalciferol) (DRISDOL) capsule 50,000 Units, 50,000 Units, Oral, Q7 days, Mansy, Jan A, MD, 50,000 Units at 11/22/21 4174  Patients Current Diet:  Diet Order             Diet Carb Modified Fluid consistency: Thin; Room service appropriate? Yes  Diet effective now                   Precautions / Restrictions Precautions Precautions: Fall Restrictions Weight Bearing Restrictions: Yes LLE Weight Bearing: Weight bearing as tolerated   Has the patient had 2 or more falls or a fall with injury in the past year? Yes  Prior Activity Level Limited Community (1-2x/wk): on CIR in July 22, had reached mod I level at home, but recent decline was requiring more assist from family (multiple UTIs, PNA, etc).  was not driving, using RW for mobility, and most immediately prior to hospitalization was requiring physical assist for all mobility and ADLs  Prior Functional Level Self Care: Did the patient need help bathing, dressing, using the toilet or eating? Needed some help immediately prior to admit  Indoor Mobility: Did the patient need assistance with walking from room to room (with or without device)? Needed some help immediately prior to admit  Stairs: Did the patient need assistance with internal or external stairs (with or without device)? Needed some help  Functional Cognition: Did the patient need help planning regular tasks such as shopping or remembering to take medications? Needed some help  Patient Information Are you of Hispanic, Latino/a,or Spanish origin?: A. No, not of Hispanic, Latino/a, or Spanish origin What is your race?: A. White Do you need or want an interpreter to communicate with a doctor or health care staff?: 0. No  Patient's Response To:  Health Literacy and Transportation Is the patient able to respond to health literacy and transportation needs?: No Health Literacy  - How often do you need to have someone help you when you read instructions, pamphlets, or other written material from your doctor or pharmacy?: Patient unable to respond In the past 12 months, has lack of transportation kept you from medical appointments or from getting medications?: Yes (husband answered) In the past 12 months, has lack of transportation kept you from meetings, work, or from getting things needed for daily living?: Yes  Development worker, international aid / [place] Devices/Equipment: CBG Meter, Environmental consultant (specify type) Home Equipment: Tub bench, Wheelchair - manual, Cane - single point, Other (comment), Rolling Walker (2 wheels)  Prior Device Use: Indicate devices/aids used by the patient prior to current illness, exacerbation or injury? Walker  Current Functional Level Cognition  Overall Cognitive Status: History of cognitive impairments - at baseline Orientation Level: Oriented X4 General Comments: spouse endorses that pt's cognition has been somewhat off since stroke in July 2022. She presents this date basically oriented (x3-self, place, month), but seemingly flat with some tasks/somewhat difficult to cue or ascertain how well pt understood. She follows most basic commands, difficulty with higher level cognitive processing.    Extremity Assessment (includes Sensation/Coordination)  Upper Extremity Assessment: LUE deficits/detail, RUE deficits/detail RUE Deficits / Details: WFL LUE Deficits / Details: L sided hemiplegia from previous CVA, contracted digits and some limited ability to shoulder shrug. Pt also with L side facial droop at baseline, but upon assessing, pt's spouse reports that it appears worse than her baseline. In addition, she is noted to have liquid run out of L corner of  Upper Extremity Assessment: LUE deficits/detail, RUE deficits/detail RUE Deficits / Details: WFL LUE Deficits / Details: L sided hemiplegia from previous CVA, contracted digits and some limited ability to shoulder shrug. Pt also with L side facial droop at baseline, but upon assessing, pt's spouse reports that it appears worse than her baseline. In addition, she is noted to have liquid run out of L corner of her mouth with sips of a drink which pt's spouse states is not baseline.  Lower Extremity Assessment: Defer to PT evaluation,  Generalized weakness, LLE deficits/detail LLE Deficits / Details: L anke injury LLE: Unable to fully assess due to immobilization     ADLs   Overall ADL's : Needs assistance/impaired General ADL Comments: MOD A + hemi walker for functional mobility, STS 5X.     Mobility   Overal bed mobility: Needs Assistance Bed Mobility: Supine to Sit Supine to sit: Min assist, HOB elevated General bed mobility comments: Pt received/left sitting in chair.     Transfers   Overall transfer level: Needs assistance Equipment used: Hemi-walker, 1 person hand held assist Transfers: Sit to/from Stand Sit to Stand: Mod assist Bed to/from chair/wheelchair/BSC transfer type:: Lateral/scoot transfer Stand pivot transfers: Mod assist, Max assist  Lateral/Scoot Transfers: Supervision General transfer comment: Mod assist with STS +2 (hemi-walker and HHA), supervision for lateral scoot and showed imrpovement from previous sessions     Ambulation / Gait / Stairs / Wheelchair Mobility   Ambulation/Gait Ambulation/Gait assistance: Mod assist, +2 physical assistance Gait Distance (Feet): 3 Feet Assistive device: Hemi-walker, 1 person hand held assist Gait Pattern/deviations: Step-to pattern, Shuffle, Decreased step length - right, Decreased step length - left, Decreased stride length General Gait Details: Mod assist due to LLE injury and LUE hemiplegia, only could take lateral shuffle steps at EOB and unable to take steps forward, scoot transfer from EOB to recliner, mod cuing for foot placement Gait velocity: decreased     Posture / Balance Balance Overall balance assessment: Needs assistance Sitting-balance support: No upper extremity supported, Feet supported Sitting balance-Leahy Scale: Good Standing balance support: Single extremity supported, During functional activity, Reliant on assistive device for balance Standing balance-Leahy Scale: Poor Standing balance comment: Increase to MOD A fpr static  standing, LOB noted with weight shifting onto left leg     Special needs/care consideration Oxygen had been requiring up to 10L on acute, now on RA and Diabetic management yes    Previous Home Environment (from acute therapy documentation) Living Arrangements: Spouse/significant other, Children (Husband and daughter who is special needs) Available Help at Discharge: Family, Available PRN/intermittently (husband (works) and MIL) Type of Home: House Home Layout: One level Home Access: Ramped entrance Bathroom Shower/Tub: Tub/shower unit Bathroom Toilet: Standard Bathroom Accessibility: Yes Home Care Services: No   Discharge Living Setting Plans for Discharge Living Setting: Patient's home, Lives with (comment) (spouse) Type of Home at Discharge: House Discharge Home Layout: One level Discharge Home Access: Ramped entrance Discharge Bathroom Shower/Tub: Tub/shower unit Discharge Bathroom Toilet: Standard Discharge Bathroom Accessibility: Yes How Accessible: Accessible via walker Does the patient have any problems obtaining your medications?: No   Social/Family/Support Systems Patient Roles: Spouse Anticipated Caregiver: spouse ([name]), mother in law (Elaine), and daughter PRN Anticipated Caregiver's Contact Information: [name] [phone] (cell) Ability/Limitations of Caregiver: [name] works, but his mom can stay with [facility] during the day for SUPERVISION only Caregiver Availability: 24/7 Discharge Plan Discussed with Primary Caregiver: Yes Is Caregiver In Agreement with Plan?: Yes Does Caregiver/Family have Issues with Lodging/Transportation while   SNF placement upon discharge: No  Patient Condition: I have reviewed medical records from [facility], spoken with  [facility] team , and patient and spouse. I discussed via phone for inpatient rehabilitation assessment.  Patient will benefit from ongoing PT, OT, and SLP, can actively participate in 3 hours of therapy a day 5 days of the week, and can make measurable gains during the admission.  Patient will also benefit from the coordinated team approach during an Inpatient Acute Rehabilitation admission.  The patient will receive intensive therapy as well as Rehabilitation physician, nursing, social worker, and care management interventions.  Due to bladder management, bowel management, safety, skin/wound care, disease management, medication administration, pain management, and patient education the patient requires 24 hour a day rehabilitation nursing.  The patient is currently mod assist with mobility and basic ADLs.  Discharge setting and therapy post discharge at home with home health is anticipated.  Patient has agreed to participate in the Acute Inpatient Rehabilitation Program and will admit today.  Preadmission Screen Completed By:  Michel Santee, PT, DPT 11/27/2021 10:32 AM ______________________________________________________________________   Discussed status with Dr. Letta Pate on 11/27/21  at 10:44 AM  and received approval for admission today.  Admission Coordinator:  Michel Santee, PT, DPT time 10:44 AM Sudie Grumbling 11/27/21    Assessment/Plan: Diagnosis:  Debility following pneumonia Does the need for close, 24 hr/day Medical supervision in concert with the patient's rehab needs make it unreasonable for this patient to be served in a less intensive setting? Yes Co-Morbidities requiring supervision/potential complications: Hx Right [place] CVA 06/2021, avulsion fx medial malleolus LEFT,  Due to bladder  management, bowel management, safety, skin/wound care, disease management, medication administration, pain management, and patient education, does the patient require 24 hr/day rehab nursing? Yes Does the patient require coordinated care of a physician, rehab nurse, PT, OT, and SLP to address physical and functional deficits in the context of the above medical diagnosis(es)? Yes Addressing deficits in the following areas: balance, endurance, locomotion, strength, transferring, bowel/bladder control, bathing, dressing, feeding, grooming, toileting, and psychosocial support Can the patient actively participate in an intensive therapy program of at least 3 hrs of therapy 5 days a week? Yes The potential for patient to make measurable gains while on inpatient rehab is good Anticipated functional outcomes upon discharge from inpatient rehab: supervision PT, supervision OT, supervision SLP Estimated rehab length of stay to reach the above functional goals is: 9-12d Anticipated discharge destination: Home 10. Overall Rehab/Functional Prognosis: good   MD Signature: Charlett Blake M.D. [place] [facility] Fellow Am Acad of Phys Med and Rehab Diplomate Am Board of Electrodiagnostic Med Fellow Am Board of Interventional Pain

## 2021-11-27 NOTE — TOC Progression Note (Signed)
Transition of Care [facility]) - Progression Note    Patient Details  Name: Erika [name] MRN: [id number] Date of Birth: Mar 30, 1971  [facility] CM/SW Ruth, RN Phone Number: 11/27/2021, 9:05 AM  Clinical Narrative:  Text CIR to inquire about Insurance Authorization waiting for reply.     Expected Discharge Plan: IP Rehab Facility Barriers to Discharge: Continued Medical Work up  Expected Discharge Plan and Services Expected Discharge Plan: [place]   Discharge Planning Services: CM Consult Post Acute Care Choice: IP Rehab Living arrangements for the past 2 months: Single Family Home                 DME Arranged: [name] DME Agency: NA       HH Arranged: NA HH Agency: NA         Social Determinants of Health (SDOH) Interventions    Readmission Risk Interventions Readmission Risk Prevention Plan 11/23/2021 04/28/2020  Transportation Screening Complete Complete  PCP or Specialist Appt within 5-7 Days - Complete  Home Care Screening - Complete  Medication Review (RN CM) - Complete  Medication Review Press photographer) Complete -  PCP or Specialist appointment within 3-5 days of discharge Complete -  [place] or Home Care Consult Complete -  SW Recovery Care/Counseling Consult Complete -  Palliative Care Screening Not Applicable -  [place] Complete -  Some recent data might be hidden

## 2021-11-27 NOTE — H&P (Signed)
Physical Medicine and Rehabilitation Admission H&P        Chief Complaint  Patient presents with   Weakness   Altered Mental Status  : HPI: Erika [name] is a 50 year old right-handed female with history of right pontine infarction maintained on Plavix and aspirin receiving inpatient rehab services 06/28/2021 - 08/01/2021, CAD status post CABG followed by cardiology services Dr. Jacqulyn Cane, multiple UTIs, right CEA x2, diabetes mellitus, fatty liver, chronic pain, PTSD maintained on Lexapro as well as Cogentin/Lamictal and Klonopin, diastolic congestive heart failure, hyperlipidemia, obesity with BMI 34.15, history of tobacco use.  Per chart review patient lives with spouse and daughter who is special needs.  1 level home ramped entrance.  Patient needed moderate assist for mobility using hemiwalker/minimal assist for ADLs as well as reported multiple falls in the past month.  Presented to [facility]/21/2021 with worsening cough x1 week.  Denied any fever or chills.  She had had a mechanical fall day prior to admission with left foot pain.  Husband had reported mental status changes.  In the ED blood pressure 143/83 and later 92/56.  Pulse O2 briefly dropped to 85% on room air rebounded to 96% on 3 L nasal cannula.  COVID-19 PCR negative.  UA showed 6-10 WBCs with rare bacteria.  EKG normal sinus rhythm.  Chest x-ray showed patchy opacities of the left greater than right lung base suspicious for pneumonia.  Left ankle film showed age-indeterminate avulsion fracture from the tip of the medial malleolus.  Left knee films negative.  MRI of the brain no acute intracranial abnormality.  Old right occipital and parietal cortical infarcts.  Admission chemistries lactic acid 2.1, blood cultures no growth to date, urine culture 30,000 multiple species, WBC 19,800,, procalcitonin level 19.24 with follow-up 6.74, sodium 134, glucose 128.  Patient was placed on broad-spectrum intravenous antibiotics transitioned to  Levaquin and completed course.  She was weaned from oxygen therapy.  Orthopedic services Dr.Poggi follow-up for avulsion fracture medial malleolus left foot conservative care Aircast applied weightbearing as tolerated.  Subcutaneous Lovenox for DVT prophylaxis.  Follow-up for psychiatry services for history of major depressive disorder/PTSD and patient remains on Cogentin/Klonopin/Lamictal.  Tolerating a regular consistency diet.  Therapy evaluations completed due to patient decreased functional mobility was admitted for a comprehensive rehab program.   Review of Systems  Constitutional:  Positive for malaise/fatigue. Negative for chills and fever.  HENT:  Negative for hearing loss.   Eyes:  Negative for blurred vision and double vision.  Respiratory:  Positive for cough. Negative for sputum production and wheezing.   Cardiovascular:  Positive for leg swelling. Negative for chest pain and palpitations.  Gastrointestinal:  Positive for constipation. Negative for heartburn, nausea and vomiting.  Genitourinary:  Positive for urgency. Negative for dysuria, flank pain and hematuria.  Musculoskeletal:  Positive for back pain, falls and myalgias.  Skin:  Negative for rash.  Psychiatric/Behavioral:  Positive for depression.        PTSD/anxiety  All other systems reviewed and are negative.     Past Medical History:  Diagnosis Date   Arthralgia of temporomandibular joint     CAD, multiple vessel      a. 06/2016 Cath: ostLM 40%, ostLAD 40%, pLAD 95%, ost-pLCx 60%, pLCx 95%, mLCx 60%, mRCA 95%, D2 50%, LVSF nl;  b. 07/2016 CABG x 4 (LIMA->LAD, VG->Diag, VG->OM, VG->RCA); c. 08/2016 Cath: 3VD w/ 4/4 patent grafts. LAD distal to LIMA has diff dzs->Med rx; d. 08/2020 Cath: 4/4 patent grafts, native  3VD. EF 55-65%-->Med Rx.   Carotid arterial disease ([place])      a. 07/2016 s/p R CEA; b. 02/2021 U/S: RICA 85-46%, LICA 2-70%.   Clotting disorder ([place])     Depression     Diastolic dysfunction      a. 06/2016 Echo: EF  50-55%, mild inf wall HK, GR1DD, mild MR, RV sys fxn nl, mildly dilated LA, PASP nl   Fatty liver disease, nonalcoholic 3500   History of blood transfusion      with heart surgery   HLD (hyperlipidemia)     Labile hypertension      a. prior renal ngiogram negative for RAS in 03/2016; b. catecholamines and metanephrines normal, mildly elevated renin with normal aldosterone and normal ratio in 02/2016   Myocardial infarction [facility]) 2017   Obesity     PAD (peripheral artery disease) ([place])      a. 09/2018 s/p L SFA stenting; b. 07/2019 Periph Angio: Patent m/d L SFA stent w/ 100% L SFA distal to stent. L AT 100d, L Peroneal diff dzs-->Med Rx; c. 02/2021 ABIs: stable @ 0.61 on R and 0.46 on L.   PTSD (post-traumatic stress disorder)     Tobacco abuse     Type 2 diabetes mellitus ([place]) 12/2015         Past Surgical History:  Procedure Laterality Date   ABDOMINAL AORTOGRAM W/LOWER EXTREMITY [name] 10/15/2018    Procedure: ABDOMINAL AORTOGRAM W/LOWER EXTREMITY;  Surgeon: Wellington Hampshire, MD;  Location: [place] [facility];  Service: Cardiovascular;  Laterality: [name];   ABDOMINAL AORTOGRAM W/LOWER EXTREMITY Bilateral 08/19/2019    Procedure: ABDOMINAL AORTOGRAM W/LOWER EXTREMITY;  Surgeon: Wellington Hampshire, MD;  Location: [place] [facility];  Service: Cardiovascular;  Laterality: Bilateral;   CARDIAC CATHETERIZATION [name] 06/29/2016    Procedure: Left Heart Cath and Coronary Angiography;  Surgeon: Minna Merritts, MD;  Location: [place] [facility];  Service: Cardiovascular;  Laterality: [name];   CARDIAC CATHETERIZATION [name] 08/29/2016    Procedure: Left Heart Cath and Cors/Grafts Angiography;  Surgeon: Wellington Hampshire, MD;  Location: [place] [facility];  Service: Cardiovascular;  Laterality: [name];   CESAREAN SECTION       CHOLECYSTECTOMY       CORONARY ARTERY BYPASS GRAFT [name] 07/06/2016    Procedure: CORONARY ARTERY BYPASS GRAFTING (CABG) x four, using left internal mammary artery and right leg greater  saphenous vein harvested endoscopically;  Surgeon: Ivin Poot, MD;  Location: [place];  Service: Open Heart Surgery;  Laterality: [name];   ENDARTERECTOMY Right 07/06/2016    Procedure: ENDARTERECTOMY CAROTID;  Surgeon: Rosetta Posner, MD;  Location: [facility] OR;  Service: Vascular;  Laterality: Right;   ENDARTERECTOMY Right 04/27/2020    Procedure: REDO OF RIGHT ENDARTERECTOMY CAROTID;  Surgeon: Rosetta Posner, MD;  Location: [facility] OR;  Service: Vascular;  Laterality: Right;   LEFT HEART CATH AND CORS/GRAFTS ANGIOGRAPHY [name] 08/24/2020    Procedure: LEFT HEART CATH AND CORS/GRAFTS ANGIOGRAPHY;  Surgeon: Wellington Hampshire, MD;  Location: [place] [facility];  Service: Cardiovascular;  Laterality: [name];   PERIPHERAL VASCULAR CATHETERIZATION [name] 04/18/2016    Procedure: Renal Angiography;  Surgeon: Wellington Hampshire, MD;  Location: [place] [facility];  Service: Cardiovascular;  Laterality: [name];   PERIPHERAL VASCULAR INTERVENTION Left 10/15/2018    Procedure: PERIPHERAL VASCULAR INTERVENTION;  Surgeon: Wellington Hampshire, MD;  Location: [place] [facility];  Service: Cardiovascular;  Laterality: Left;  Left superficial femoral   TEE  WITHOUT CARDIOVERSION [name] 07/06/2016    Procedure: TRANSESOPHAGEAL ECHOCARDIOGRAM (TEE);  Surgeon: Ivin Poot, MD;  Location: [facility];  Service: Open Heart Surgery;  Laterality: [name];   TONSILLECTOMY             Family History  Adopted: Yes  Problem Relation Age of Onset   Diabetes Mother     Diabetes Father     Alcohol abuse Father     Heart disease Father     Drug abuse Father     Stroke Sister     Anxiety disorder Sister      Social History:  reports that she has quit smoking. Her smoking use included cigarettes. She has a 13.50 pack-year smoking history. She has never used smokeless tobacco. She reports that she does not currently use alcohol. She reports that she does not use drugs. Allergies:       Allergies  Allergen Reactions   Chantix [Varenicline Tartrate] Other (See  Comments)      Feels "crazy" and angry            Medications Prior to Admission  Medication Sig Dispense Refill   acetaminophen (TYLENOL) 325 MG tablet Take 1-2 tablets (325-650 mg total) by mouth every 4 (four) hours as needed for mild pain.       albuterol (PROVENTIL HFA;VENTOLIN HFA) 108 (90 Base) MCG/ACT inhaler Inhale 2 puffs into the lungs every 6 (six) hours as needed for wheezing. 1 Inhaler 0   aspirin 81 MG chewable tablet Chew 1 tablet (81 mg total) by mouth daily.       benztropine (COGENTIN) 0.5 MG tablet Take 1 tablet (0.5 mg total) by mouth at bedtime. 90 tablet 0   butalbital-acetaminophen-caffeine (FIORICET) 50-325-40 MG tablet Take 1 tablet by mouth daily as needed for headache. 30 tablet 0   carvedilol (COREG) 6.25 MG tablet Take 1 tablet (6.25 mg total) by mouth 2 (two) times daily. 180 tablet 1   chlorproMAZINE (THORAZINE) 100 MG tablet TAKE 1 TABLET BY MOUTH EVERYDAY AT BEDTIME 30 tablet 2   clonazePAM (KLONOPIN) 0.5 MG tablet Take 1 tablet (0.5 mg total) by mouth 2 (two) times daily. 60 tablet 2   clopidogrel (PLAVIX) 75 MG tablet Take 1 tablet (75 mg total) by mouth daily. 30 tablet 2   cyanocobalamin (,VITAMIN B-12,) 1000 MCG/ML injection Inject 1 mL (1,000 mcg total) into the skin every 30 (thirty) days. 1 mL 0   cyclobenzaprine (FLEXERIL) 10 MG tablet Take 1 tablet (10 mg total) by mouth at bedtime. 30 tablet 0   Dulaglutide (TRULICITY) 4.80 XK/5.5VZ SOPN Inject 0.75 mg into the skin once a week. 6 mL 3   escitalopram (LEXAPRO) 20 MG tablet Take 1 tablet (20 mg total) by mouth daily. 30 tablet 1   Evolocumab (REPATHA SURECLICK) 482 MG/ML SOAJ Inject 1 Dose into the skin every 14 (fourteen) days. 6 mL 3   ezetimibe (ZETIA) 10 MG tablet Take 1 tablet (10 mg total) by mouth daily. 30 tablet 2   guaiFENesin (MUCINEX) 600 MG 12 hr tablet Take 1 tablet (600 mg total) by mouth 2 (two) times daily. 60 tablet 0   icosapent Ethyl (VASCEPA) 1 g capsule Take 2 capsules (2 g  total) by mouth 2 (two) times daily. 360 capsule 3   insulin glargine-yfgn (SEMGLEE, YFGN,) 100 UNIT/ML Pen Inject 54 Units into the skin in the morning. And pen needles 1/day (Patient taking differently: Inject 27 Units into the skin 2 (two) times daily. And  pen needles 1/day) 60 mL 3   isosorbide mononitrate (IMDUR) 60 MG 24 hr tablet TAKE ONE TABLET BY MOUTH TWICE A DAY 60 tablet 2   lamoTRIgine (LAMICTAL) 200 MG tablet Take 1 tablet (200 mg total) by mouth at bedtime. 90 tablet 0   nitroGLYCERIN (NITROSTAT) 0.4 MG SL tablet Place 1 tablet (0.4 mg total) under the tongue every 5 (five) minutes as needed for chest pain. 25 tablet 1   ondansetron (ZOFRAN) 4 MG tablet Take 1 tablet (4 mg total) by mouth daily as needed for nausea or vomiting. 20 tablet 11   pantoprazole (PROTONIX) 40 MG tablet TAKE ONE TABLET BY MOUTH ONE TIME DAILY AT 12 AT NOON 30 tablet 0   polyethylene glycol powder (GLYCOLAX/MIRALAX) 17 GM/SCOOP powder MIX 1 CAPFUL IN 8 OUNCES OF FLUID BY MOUTH TWICE A DAY 238 g 0   pregabalin (LYRICA) 225 MG capsule Take 1 capsule (225 mg total) by mouth 2 (two) times daily. 60 capsule 2   rosuvastatin (CRESTOR) 20 MG tablet Take 1 tablet (20 mg total) by mouth daily. 30 tablet 0   senna-docusate (SENOKOT-S) 8.6-50 MG tablet Take 2 tablets by mouth 2 (two) times daily. 120 tablet 0   spironolactone (ALDACTONE) 25 MG tablet TAKE ONE TABLET BY MOUTH ONE TIME DAILY 30 tablet 0   vitamin B-12 1000 MCG tablet Take 1 tablet (1,000 mcg total) by mouth daily.       Continuous Blood Gluc Sensor (DEXCOM G6 SENSOR) MISC 1 Dose by Does not apply route daily. 30 each 3   Continuous Blood Gluc Transmit (DEXCOM G6 TRANSMITTER) MISC 1 Units by Does not apply route daily. 1 each 3   Incontinence Supply Disposable (DEPEND ADJUSTABLE UNDERWEAR LG) MISC 1 Units by Does not apply route daily. 30 each 3   Insulin Syringe-Needle U-100 29G X 1/2" 0.3 ML MISC 1 each by Does not apply route 2 (two) times daily. 30 each  0   potassium chloride (KLOR-CON) 20 MEQ packet Take 20 mEq by mouth daily. (Patient not taking: Reported on 11/21/2021) 30 packet 0   torsemide (DEMADEX) 10 MG tablet Take 1 tablet (10 mg total) by mouth daily. PLEASE SCHEDULE OFFICE VISIT FOR FURTHER REFILLS. THANK YOU! 30 tablet 0   ULTICARE MINI PEN NEEDLES 31G X 6 MM MISC         Vitamin D, Ergocalciferol, (DRISDOL) 1.25 MG (50000 UNIT) CAPS capsule Take 1 capsule (50,000 Units total) by mouth every 7 (seven) days. (Patient not taking: Reported on 11/21/2021) 5 capsule 0      Drug Regimen Review Drug regimen was reviewed and remains appropriate with no significant issues identified   Home: Home Living Family/patient expects to be discharged to:: Private residence Living Arrangements: Spouse/significant other, Children (Husband and daughter who is special needs) Available Help at Discharge: Family, Available PRN/intermittently (husband (works) and [employer]) Type of Home: House Home Access: Ramped entrance [place]: One level Bathroom Shower/Tub: Chiropodist: Standard Bathroom Accessibility: Yes Home Equipment: Tub bench, Wheelchair - manual, Radio producer - single point, Other (comment), Rolling Walker (2 wheels)   Functional History: Prior Function Prior Level of Function : Needs assist, History of Falls (last six months) Physical Assist : Mobility (physical), ADLs (physical) Mobility (physical): Transfers, Gait, Stairs ADLs (physical): Bathing, Dressing Mobility Comments: Husband reports pt needed mod to max A with all mobility, used hemi walker or RW for ambulation, 7 or 8 falls in the past month ADLs Comments: Husband reports pt needed  min to mod A with all ADLs, stated they did stand pivot transfers to tub bench   Functional Status:  Mobility: Bed Mobility Overal bed mobility: Needs Assistance Bed Mobility: Supine to Sit Supine to sit: Min assist, HOB elevated General bed mobility comments: Pt received/left  sitting in chair. Transfers Overall transfer level: Needs assistance Equipment used: Hemi-walker, 1 person hand held assist Transfers: Sit to/from Stand Sit to Stand: Mod assist Bed to/from chair/wheelchair/BSC transfer type:: Lateral/scoot transfer Stand pivot transfers: Mod assist, Max assist  Lateral/Scoot Transfers: Supervision General transfer comment: Mod assist with STS +2 (hemi-walker and HHA), supervision for lateral scoot and showed imrpovement from previous sessions Ambulation/Gait Ambulation/Gait assistance: Mod assist, +2 physical assistance Gait Distance (Feet): 3 Feet Assistive device: Hemi-walker, 1 person hand held assist Gait Pattern/deviations: Step-to pattern, Shuffle, Decreased step length - right, Decreased step length - left, Decreased stride length General Gait Details: Mod assist due to LLE injury and LUE hemiplegia, only could take lateral shuffle steps at EOB and unable to take steps forward, scoot transfer from EOB to recliner, mod cuing for foot placement Gait velocity: decreased   ADL: ADL Overall ADL's : Needs assistance/impaired General ADL Comments: MOD A + hemi walker for functional mobility, STS 5X.   Cognition: Cognition Overall Cognitive Status: History of cognitive impairments - at baseline Orientation Level: Oriented X4 Cognition Arousal/Alertness: Awake/alert Behavior During Therapy: WFL for tasks assessed/performed Overall Cognitive Status: History of cognitive impairments - at baseline General Comments: spouse endorses that pt's cognition has been somewhat off since stroke in July 2022. She presents this date basically oriented (x3-self, place, month), but seemingly flat with some tasks/somewhat difficult to cue or ascertain how well pt understood. She follows most basic commands, difficulty with higher level cognitive processing.   Physical Exam: Blood pressure (!) 147/85, pulse 72, temperature 97.6 F (36.4 C), temperature source Oral,  resp. rate 16, height 5\' 7"  (1.702 m), weight 98.9 kg, SpO2 96 %. Physical Exam Neurological:     Comments: Patient is alert.  No acute distress.  Makes eye contact with examiner.  Provides name and age.  Follows simple commands.  Limited but fair medical historian.      General: No acute distress Mood and affect are appropriate Heart: Regular rate and rhythm no rubs murmurs or extra sounds Lungs: Clear to auscultation, breathing unlabored, no rales or wheezes Abdomen: Positive bowel sounds, soft nontender to palpation, nondistended Extremities: No clubbing, cyanosis, or edema Skin: No evidence of breakdown, no evidence of rash Neurologic: Cranial nerves II through XII intact, motor strength is 5/5 in RIght and 3-/5 left deltoid, bicep, tricep, grip, hip flexor, knee extensors, ankle dorsiflexor and plantar flexor Sensory exam normal sensation to light touch and proprioception in bilateral upper and lower extremities Tone- increased Left finger and wrist flexor tone, and left hip adductor tone  Musculoskeletal: Full range of motion in all 4 extremities. No joint swelling  Lab Results Last 48 Hours        Results for orders placed or performed during the hospital encounter of 11/21/21 (from the past 48 hour(s))  Glucose, capillary     Status: Abnormal    Collection Time: 11/25/21 11:28 AM  Result Value Ref Range    Glucose-Capillary 116 (H) 70 - 99 mg/dL      Comment: Glucose reference range applies only to samples taken after fasting for at least 8 hours.  Glucose, capillary     Status: Abnormal    Collection Time: 11/25/21  3:30 PM  Result Value Ref Range    Glucose-Capillary 107 (H) 70 - 99 mg/dL      Comment: Glucose reference range applies only to samples taken after fasting for at least 8 hours.  Glucose, capillary     Status: None    Collection Time: 11/25/21  8:54 PM  Result Value Ref Range    Glucose-Capillary 99 70 - 99 mg/dL      Comment: Glucose reference range applies  only to samples taken after fasting for at least 8 hours.  Glucose, capillary     Status: Abnormal    Collection Time: 11/26/21 11:48 AM  Result Value Ref Range    Glucose-Capillary 116 (H) 70 - 99 mg/dL      Comment: Glucose reference range applies only to samples taken after fasting for at least 8 hours.  Glucose, capillary     Status: Abnormal    Collection Time: 11/26/21  4:11 PM  Result Value Ref Range    Glucose-Capillary 129 (H) 70 - 99 mg/dL      Comment: Glucose reference range applies only to samples taken after fasting for at least 8 hours.  Glucose, capillary     Status: Abnormal    Collection Time: 11/26/21  8:17 PM  Result Value Ref Range    Glucose-Capillary 104 (H) 70 - 99 mg/dL      Comment: Glucose reference range applies only to samples taken after fasting for at least 8 hours.  Glucose, capillary     Status: Abnormal    Collection Time: 11/27/21  8:07 AM  Result Value Ref Range    Glucose-Capillary 107 (H) 70 - 99 mg/dL      Comment: Glucose reference range applies only to samples taken after fasting for at least 8 hours.    *Note: Due to a large number of results and/or encounters for the requested time period, some results have not been displayed. A complete set of results can be found in Results Review.      Imaging Results (Last 48 hours)  No results found.           Medical Problem List and Plan: 1.  Debility functional deficits secondary to acute hypoxic respiratory failure/community-acquired pneumonia.  Antibiotic therapy completed.  Wean oxygen to room air             -patient may  shower             -ELOS/Goals: 9-12d, sup/minA goals 2.  Antithrombotics: -DVT/anticoagulation:  Pharmaceutical: Lovenox             -antiplatelet therapy: Aspirin 81 mg daily, Plavix 75 mg daily 3. Pain Management/chronic pain: Lyrica 225 mg twice daily, hydrocodone as needed, Flexeril 10 mg nightly, Advil as needed 4. Mood/PTSD: Lexapro 20 mg daily, Lamictal 200 mg  nightly, Thorazine 100 mg nightly, Cogentin 0.5 mg nightly, Klonopin 0.5 mg twice daily             -antipsychotic agents: [name] 5. Neuropsych: This patient is capable of making decisions on her own behalf. 6. Skin/Wound Care: Routine skin checks 7. Fluids/Electrolytes/Nutrition: Routine in and outs with follow-up chemistries 8.  Avulsion fracture medial malleolus left foot status post fall.  [facility]ative care orthopedic service Dr. Roland Rack.  Aircast as needed.  Weightbearing as tolerated 9.  History of right pontine infarction.  Patient received CIR July 2022.  Continue aspirin and Plavix 10.  CAD/CABG.  No chest pain or shortness of breath.  Continue Plavix/aspirin 11.  Hypertension.  Coreg 6.25 mg twice daily, Imdur 60 mg twice daily, Aldactone 25 mg daily, Demadex 10 mg daily.  Monitor with increased mobility 12.  Diabetes mellitus.  Hemoglobin A1c 7.0.  Semglee 54 units daily.  Check blood sugars before meals and at bedtime 13.  Diastolic congestive heart failure.  Continue Demadex 10 mg daily.  Monitor for any signs of fluid overload 14.  Hyperlipidemia.  Crestor/Zetia 15.  GERD.  Protonix 16.  Constipation.  MiraLAX twice daily, Senokot S2 tablets twice daily 17.  Spasticity related to CVA , may benefit from Botox as [place], PA-C 11/27/2021   "I have personally performed a face to face diagnostic evaluation of this patient.  Additionally, I have reviewed and concur with the physician assistant's documentation above." Charlett Blake M.D. [employer] Fellow Am Acad of [employer] Med and Rehab Diplomate Am Board of Electrodiagnostic Med Fellow Am Board of Interventional Pain

## 2021-11-27 NOTE — Telephone Encounter (Signed)
Pharmacy is sending refill request for Pantoprazole. Would you like to refill?

## 2021-11-27 NOTE — Progress Notes (Signed)
PMR Admission Coordinator Pre-Admission Assessment   Patient: Erika [name] is an 50 y.o., female MRN: [id number] DOB: 1971-12-12 Height: _0  (170.2 cm) Weight: 98.9 kg   Insurance Information HMO: yes    PPO:      PCP:      IPA:      80/20:      OTHER:  PRIMARY: [employer]      Policy#: [id number]      Subscriber: pt CM Name: Butch Penny      Phone#: [phone] ext [phone]     Fax#: [phone] Pre-Cert#: [id number] Josem Kaufmann for CIR from Butch Penny with updates due to [place] at [phone] (phone [phone])      Employer:  Benefits:  Phone #: [phone]     Name:  Eff. Date: 12/25/19     Deduct: $1250 (met)      Out of Pocket Max: $4500 (met)      Life Max: n/a CIR: 80%      SNF: 80% Outpatient:      Co-Pay: $50/visit Home Health: 80%      Co-Pay: 20% DME: 100%     Co-Pay:  Providers:  SECONDARY: Medicare Part A       Policy#:      Phone#:    Financial Counselor:       Phone#:    The "Data Collection Information Summary" for patients in Inpatient Rehabilitation Facilities with attached "Privacy Act Melissa Records" was provided and verbally reviewed with: Patient/Family   Emergency Contact Information Contact Information       Name Relation Home Work Mobile    [place] Spouse   [phone]    [facility] Mother     [phone]           Current Medical History  Patient Admitting Diagnosis: debility   History of Present Illness: Erika [name] is a 50 year old right-handed female with history of right pontine infarction maintained on Plavix and aspirin receiving inpatient rehab services 06/28/2021 - 08/01/2021, CAD status post CABG followed by cardiology services Dr. Jacqulyn Cane, multiple UTIs, right CEA x2, diabetes mellitus, fatty liver, chronic pain, PTSD maintained on Lexapro as well as Cogentin/Lamictal and Klonopin, diastolic congestive heart failure, hyperlipidemia, obesity with BMI 34.15, history of tobacco use. Presented  to [facility] 11/21/2021 with worsening cough x1 week.  Denied any fever or chills.  She had had a mechanical fall day prior to admission with left foot pain.  Husband had reported mental status changes.  In the ED blood pressure 143/83 and later 92/56.  Pulse O2 briefly dropped to 85% on room air rebounded to 96% on 3 L nasal cannula.  COVID-19 PCR negative.  UA showed 6-10 WBCs with rare bacteria.  EKG normal sinus rhythm.  Chest x-ray showed patchy opacities of the left greater than right lung base suspicious for pneumonia.  Left ankle film showed age-indeterminate avulsion fracture from the tip of the medial malleolus.  Left knee films negative.  MRI of the brain no acute intracranial abnormality.  Old right occipital and parietal cortical infarcts.  Admission chemistries lactic acid 2.1, blood cultures no growth to date, urine culture 30,000 multiple species, WBC 19,800,, procalcitonin level 19.24 with follow-up 6.74, sodium 134, glucose 128.  Patient was placed on broad-spectrum intravenous antibiotics transitioned to Levaquin and completed course.  She was weaned from oxygen therapy.  Orthopedic services Dr.Poggi follow-up for avulsion fracture medial malleolus left foot conservative care Aircast applied weightbearing as tolerated.  Subcutaneous Lovenox for DVT prophylaxis.  Follow-up for psychiatry services for history of major depressive disorder/PTSD and patient remains on Cogentin/Klonopin/Lamictal.  Tolerating a regular consistency diet.  Therapy evaluations completed due to patient decreased functional mobility was recommended for a comprehensive rehab progra   Patient's medical record from [facility] has been reviewed by the rehabilitation admission coordinator and physician.   Past Medical History      Past Medical History:  Diagnosis Date   Arthralgia of temporomandibular joint     CAD, multiple vessel      a. 06/2016 Cath: ostLM 40%, ostLAD 40%, pLAD 95%, ost-pLCx 60%, pLCx 95%, mLCx 60%, mRCA  95%, D2 50%, LVSF nl;  b. 07/2016 CABG x 4 (LIMA->LAD, VG->Diag, VG->OM, VG->RCA); c. 08/2016 Cath: 3VD w/ 4/4 patent grafts. LAD distal to LIMA has diff dzs->Med rx; d. 08/2020 Cath: 4/4 patent grafts, native 3VD. EF 55-65%-->Med Rx.   Carotid arterial disease ([place])      a. 07/2016 s/p R CEA; b. 02/2021 U/S: RICA 95-28%, LICA 4-13%.   Clotting disorder ([place])     Depression     Diastolic dysfunction      a. 06/2016 Echo: EF 50-55%, mild inf wall HK, GR1DD, mild MR, RV sys fxn nl, mildly dilated LA, PASP nl   Fatty liver disease, nonalcoholic 2440   History of blood transfusion      with heart surgery   HLD (hyperlipidemia)     Labile hypertension      a. prior renal ngiogram negative for RAS in 03/2016; b. catecholamines and metanephrines normal, mildly elevated renin with normal aldosterone and normal ratio in 02/2016   Myocardial infarction [facility]) 2017   Obesity     PAD (peripheral artery disease) ([place])      a. 09/2018 s/p L SFA stenting; b. 07/2019 Periph Angio: Patent m/d L SFA stent w/ 100% L SFA distal to stent. L AT 100d, L Peroneal diff dzs-->Med Rx; c. 02/2021 ABIs: stable @ 0.61 on R and 0.46 on L.   PTSD (post-traumatic stress disorder)     Tobacco abuse     Type 2 diabetes mellitus ([place]) 12/2015      Has the patient had major surgery during 100 days prior to admission? No   Family History   family history includes Alcohol abuse in her father; Anxiety disorder in her sister; Diabetes in her father and mother; Drug abuse in her father; Heart disease in her father; Stroke in her sister. She was adopted.   Current Medications   Current Facility-Administered Medications:    acetaminophen (TYLENOL) tablet 650 mg, 650 mg, Oral, Q6H PRN **OR** acetaminophen (TYLENOL) suppository 650 mg, 650 mg, Rectal, Q6H PRN, Mansy, Jan A, MD   albuterol (PROVENTIL) (2.5 MG/3ML) 0.083% nebulizer solution 2.5 mg, 2.5 mg, Nebulization, Q6H PRN, Mansy, Jan A, MD   aspirin chewable tablet 81 mg, 81 mg, Oral,  Daily, Mansy, Jan A, MD, 81 mg at 11/27/21 0844   benztropine (COGENTIN) tablet 0.5 mg, 0.5 mg, Oral, QHS, Mansy, Jan A, MD, 0.5 mg at 11/26/21 2110   carvedilol (COREG) tablet 6.25 mg, 6.25 mg, Oral, BID, Mansy, Jan A, MD, 6.25 mg at 11/27/21 0845   chlorpheniramine-HYDROcodone (TUSSIONEX) 10-8 MG/5ML suspension 5 mL, 5 mL, Oral, Q12H PRN, Mansy, Jan A, MD   chlorproMAZINE (THORAZINE) tablet 100 mg, 100 mg, Oral, QHS, Mansy, Jan A, MD, 100 mg at 11/26/21 2110   clonazePAM (KLONOPIN) tablet 0.5 mg, 0.5 mg, Oral, BID, Mansy, Jan A, MD, 0.5 mg at 11/27/21 [phone]  clopidogrel (PLAVIX) tablet 75 mg, 75 mg, Oral, Daily, Mansy, Jan A, MD, 75 mg at 11/27/21 0845   cyanocobalamin ((VITAMIN B-12)) injection 1,000 mcg, 1,000 mcg, Subcutaneous, Q30 days, Mansy, Jan A, MD   cyclobenzaprine (FLEXERIL) tablet 10 mg, 10 mg, Oral, QHS, Mansy, Jan A, MD, 10 mg at 11/26/21 2110   cyclobenzaprine (FLEXERIL) tablet 10 mg, 10 mg, Oral, Once, Sreenath, Sudheer B, MD   enoxaparin (LOVENOX) injection 50 mg, 0.5 mg/kg, Subcutaneous, Q24H, Mansy, Jan A, MD, 50 mg at 11/26/21 2111   escitalopram (LEXAPRO) tablet 20 mg, 20 mg, Oral, Daily, Mansy, Jan A, MD, 20 mg at 11/27/21 0845   ezetimibe (ZETIA) tablet 10 mg, 10 mg, Oral, Daily, Mansy, Jan A, MD, 10 mg at 11/27/21 0845   guaiFENesin (MUCINEX) 12 hr tablet 600 mg, 600 mg, Oral, BID, Mansy, Jan A, MD, 600 mg at 11/27/21 0844   hydrALAZINE (APRESOLINE) injection 5 mg, 5 mg, Intravenous, Q4H PRN, Rise Patience, MD, 5 mg at 11/26/21 0002   HYDROcodone-acetaminophen (NORCO/VICODIN) 5-325 MG per tablet 1-2 tablet, 1-2 tablet, Oral, Q6H PRN, Fritzi Mandes, MD, 2 tablet at 11/27/21 0846   ibuprofen (ADVIL) tablet 400 mg, 400 mg, Oral, Q8H PRN, Fritzi Mandes, MD   icosapent Ethyl (VASCEPA) 1 g capsule 2 g, 2 g, Oral, BID, Mansy, Jan A, MD, 2 g at 11/27/21 0844   insulin aspart (novoLOG) injection 0-9 Units, 0-9 Units, Subcutaneous, TID AC & HS, Mansy, Jan A, MD, 1 Units at  11/26/21 1723   insulin glargine-yfgn (SEMGLEE) injection 54 Units, 54 Units, Subcutaneous, Daily, Mansy, Jan A, MD, 54 Units at 11/27/21 0843   ipratropium-albuterol (DUONEB) 0.5-2.5 (3) MG/3ML nebulizer solution 3 mL, 3 mL, Nebulization, QID, Mansy, Jan A, MD, 3 mL at 11/27/21 0720   isosorbide mononitrate (IMDUR) 24 hr tablet 60 mg, 60 mg, Oral, BID, Mansy, Jan A, MD, 60 mg at 11/27/21 0845   lamoTRIgine (LAMICTAL) tablet 200 mg, 200 mg, Oral, QHS, Mansy, Jan A, MD, 200 mg at 11/26/21 2116   magnesium hydroxide (MILK OF MAGNESIA) suspension 30 mL, 30 mL, Oral, Daily PRN, Mansy, Jan A, MD   morphine 2 MG/ML injection 1 mg, 1 mg, Intravenous, Q8H PRN, Fritzi Mandes, MD, 1 mg at 11/25/21 1445   nitroGLYCERIN (NITROSTAT) SL tablet 0.4 mg, 0.4 mg, Sublingual, Q5 min PRN, Mansy, Jan A, MD   ondansetron (ZOFRAN) tablet 4 mg, 4 mg, Oral, Q6H PRN **OR** ondansetron (ZOFRAN) injection 4 mg, 4 mg, Intravenous, Q6H PRN, Mansy, Jan A, MD   ondansetron [facility]) tablet 4 mg, 4 mg, Oral, Daily PRN, Mansy, Jan A, MD   pantoprazole (PROTONIX) EC tablet 40 mg, 40 mg, Oral, Daily, Mansy, Jan A, MD, 40 mg at 11/27/21 0845   polyethylene glycol (MIRALAX / GLYCOLAX) packet 17 g, 17 g, Oral, BID, Mansy, Jan A, MD   potassium chloride (KLOR-CON) packet 20 mEq, 20 mEq, Oral, Daily, Mansy, Jan A, MD, 20 mEq at 11/27/21 0844   pregabalin (LYRICA) capsule 225 mg, 225 mg, Oral, BID, Mansy, Jan A, MD, 225 mg at 11/27/21 0846   rosuvastatin (CRESTOR) tablet 20 mg, 20 mg, Oral, Daily, Mansy, Jan A, MD, 20 mg at 11/27/21 9169   senna-docusate (Senokot-S) tablet 2 tablet, 2 tablet, Oral, BID, Mansy, Jan A, MD, 2 tablet at 11/27/21 0845   spironolactone (ALDACTONE) tablet 25 mg, 25 mg, Oral, Daily, Mansy, Jan A, MD, 25 mg at 11/27/21 0845   torsemide (DEMADEX) tablet 10 mg, 10 mg, Oral, Daily, Posey Pronto,  Sona, MD, 10 mg at 11/27/21 0845   traZODone (DESYREL) tablet 25 mg, 25 mg, Oral, QHS PRN, Mansy, Jan A, MD, 25 mg at 11/25/21 2051    vitamin B-12 (CYANOCOBALAMIN) tablet 1,000 mcg, 1,000 mcg, Oral, Daily, Mansy, Jan A, MD, 1,000 mcg at 11/27/21 0845   Vitamin D (Ergocalciferol) (DRISDOL) capsule 50,000 Units, 50,000 Units, Oral, Q7 days, Mansy, Jan A, MD, 50,000 Units at 11/22/21 2620   Patients Current Diet:  Diet Order                  Diet Carb Modified Fluid consistency: Thin; Room service appropriate? Yes  Diet effective now                         Precautions / Restrictions Precautions Precautions: Fall Restrictions Weight Bearing Restrictions: Yes LLE Weight Bearing: Weight bearing as tolerated    Has the patient had 2 or more falls or a fall with injury in the past year? Yes   Prior Activity Level Limited Community (1-2x/wk): on CIR in July 22, had reached mod I level at home, but recent decline was requiring more assist from family (multiple UTIs, PNA, etc).  was not driving, using RW for mobility, and most immediately prior to hospitalization was requiring physical assist for all mobility and ADLs   Prior Functional Level Self Care: Did the patient need help bathing, dressing, using the toilet or eating? Needed some help immediately prior to admit   Indoor Mobility: Did the patient need assistance with walking from room to room (with or without device)? Needed some help immediately prior to admit   Stairs: Did the patient need assistance with internal or external stairs (with or without device)? Needed some help   Functional Cognition: Did the patient need help planning regular tasks such as shopping or remembering to take medications? Needed some help   Patient Information Are you of Hispanic, Latino/a,or Spanish origin?: A. No, not of Hispanic, Latino/a, or Spanish origin What is your race?: A. White Do you need or want an interpreter to communicate with a doctor or health care staff?: 0. No   Patient's Response To:  Health Literacy and Transportation Is the patient able to respond to health  literacy and transportation needs?: No Health Literacy - How often do you need to have someone help you when you read instructions, pamphlets, or other written material from your doctor or pharmacy?: Patient unable to respond In the past 12 months, has lack of transportation kept you from medical appointments or from getting medications?: Yes (husband answered) In the past 12 months, has lack of transportation kept you from meetings, work, or from getting things needed for daily living?: Yes   Development worker, international aid / [place] Devices/Equipment: CBG Meter, Environmental consultant (specify type) Home Equipment: Tub bench, Wheelchair - manual, Cane - single point, Other (comment), Rolling Walker (2 wheels)   Prior Device Use: Indicate devices/aids used by the patient prior to current illness, exacerbation or injury? Walker   Current Functional Level Cognition   Overall Cognitive Status: History of cognitive impairments - at baseline Orientation Level: Oriented X4 General Comments: spouse endorses that pt's cognition has been somewhat off since stroke in July 2022. She presents this date basically oriented (x3-self, place, month), but seemingly flat with some tasks/somewhat difficult to cue or ascertain how well pt understood. She follows most basic commands, difficulty with higher level cognitive processing.    Extremity Assessment (includes Sensation/Coordination)  Upper Extremity Assessment: LUE deficits/detail, RUE deficits/detail RUE Deficits / Details: WFL LUE Deficits / Details: L sided hemiplegia from previous CVA, contracted digits and some limited ability to shoulder shrug. Pt also with L side facial droop at baseline, but upon assessing, pt's spouse reports that it appears worse than her baseline. In addition, she is noted to have liquid run out of L corner of her mouth with sips of a drink which pt's spouse states is not baseline.  Lower Extremity Assessment: Defer to PT evaluation,  Generalized weakness, LLE deficits/detail LLE Deficits / Details: L anke injury LLE: Unable to fully assess due to immobilization     ADLs   Overall ADL's : Needs assistance/impaired General ADL Comments: MOD A + hemi walker for functional mobility, STS 5X.     Mobility   Overal bed mobility: Needs Assistance Bed Mobility: Supine to Sit Supine to sit: Min assist, HOB elevated General bed mobility comments: Pt received/left sitting in chair.     Transfers   Overall transfer level: Needs assistance Equipment used: Hemi-walker, 1 person hand held assist Transfers: Sit to/from Stand Sit to Stand: Mod assist Bed to/from chair/wheelchair/BSC transfer type:: Lateral/scoot transfer Stand pivot transfers: Mod assist, Max assist  Lateral/Scoot Transfers: Supervision General transfer comment: Mod assist with STS +2 (hemi-walker and HHA), supervision for lateral scoot and showed imrpovement from previous sessions     Ambulation / Gait / Stairs / Wheelchair Mobility   Ambulation/Gait Ambulation/Gait assistance: Mod assist, +2 physical assistance Gait Distance (Feet): 3 Feet Assistive device: Hemi-walker, 1 person hand held assist Gait Pattern/deviations: Step-to pattern, Shuffle, Decreased step length - right, Decreased step length - left, Decreased stride length General Gait Details: Mod assist due to LLE injury and LUE hemiplegia, only could take lateral shuffle steps at EOB and unable to take steps forward, scoot transfer from EOB to recliner, mod cuing for foot placement Gait velocity: decreased     Posture / Balance Balance Overall balance assessment: Needs assistance Sitting-balance support: No upper extremity supported, Feet supported Sitting balance-Leahy Scale: Good Standing balance support: Single extremity supported, During functional activity, Reliant on assistive device for balance Standing balance-Leahy Scale: Poor Standing balance comment: Increase to MOD A fpr static  standing, LOB noted with weight shifting onto left leg     Special needs/care consideration Oxygen had been requiring up to 10L on acute, now on RA and Diabetic management yes    Previous Home Environment (from acute therapy documentation) Living Arrangements: Spouse/significant other, Children (Husband and daughter who is special needs) Available Help at Discharge: Family, Available PRN/intermittently (husband (works) and [employer]) Type of Home: House Home Layout: One level Home Access: Ramped entrance Bathroom Shower/Tub: Chiropodist: Programmer, systems: Yes Home Care Services: No   Discharge Living Setting Plans for Discharge Living Setting: Patient's home, Lives with (comment) (spouse) Type of Home at Discharge: House Discharge Home Layout: One level Discharge Home Access: Ramped entrance Discharge Bathroom Shower/Tub: Tub/shower unit Discharge Bathroom Toilet: Standard Discharge Bathroom Accessibility: Yes How Accessible: Accessible via walker Does the patient have any problems obtaining your medications?: No   Social/Family/Support Systems Patient Roles: Spouse Anticipated Caregiver: spouse Company secretary), mother in law Margaretha Sheffield), and daughter PRN Anticipated Caregiver's Contact Information: Catalina Antigua [phone] (cell) Ability/Limitations of Caregiver: Catalina Antigua works, but his mom can stay with Alyse Low during the day for SUPERVISION only Caregiver Availability: 24/7 Discharge Plan Discussed with Primary Caregiver: Yes Is Caregiver In Agreement with Plan?: Yes Does Caregiver/Family have Issues with Lodging/Transportation while  Pt is in Rehab?: No   Goals Patient/Family Goal for Rehab: PT/OT supervision, SLP supervision to mod I Expected length of stay: 9-12 days Additional Information: on CIR in July 2022 Pt/Family Agrees to Admission and willing to participate: Yes Program Orientation Provided & Reviewed with Pt/Caregiver Including Roles  & Responsibilities:  Yes Additional Information Needs: no  Barriers to Discharge: Insurance for SNF coverage   Decrease burden of Care through IP rehab admission: n/a   Possible need for SNF placement upon discharge: No   Patient Condition: I have reviewed medical records from [facility], spoken with  [facility] team , and patient and spouse. I discussed via phone for inpatient rehabilitation assessment.  Patient will benefit from ongoing PT, OT, and SLP, can actively participate in 3 hours of therapy a day 5 days of the week, and can make measurable gains during the admission.  Patient will also benefit from the coordinated team approach during an Inpatient Acute Rehabilitation admission.  The patient will receive intensive therapy as well as Rehabilitation physician, nursing, social worker, and care management interventions.  Due to bladder management, bowel management, safety, skin/wound care, disease management, medication administration, pain management, and patient education the patient requires 24 hour a day rehabilitation nursing.  The patient is currently mod assist with mobility and basic ADLs.  Discharge setting and therapy post discharge at home with home health is anticipated.  Patient has agreed to participate in the Acute Inpatient Rehabilitation Program and will admit today.   Preadmission Screen Completed By:  Michel Santee, PT, DPT 11/27/2021 10:32 AM ______________________________________________________________________   Discussed status with Dr. Letta Pate on 11/27/21  at 10:44 AM  and received approval for admission today.   Admission Coordinator:  Michel Santee, PT, DPT time 10:44 AM Sudie Grumbling 11/27/21     Assessment/Plan: Diagnosis:  Debility following pneumonia Does the need for close, 24 hr/day Medical supervision in concert with the patient's rehab needs make it unreasonable for this patient to be served in a less intensive setting? Yes Co-Morbidities requiring supervision/potential complications: Hx  Right [place] CVA 06/2021, avulsion fx medial malleolus LEFT,  Due to bladder management, bowel management, safety, skin/wound care, disease management, medication administration, pain management, and patient education, does the patient require 24 hr/day rehab nursing? Yes Does the patient require coordinated care of a physician, rehab nurse, PT, OT, and SLP to address physical and functional deficits in the context of the above medical diagnosis(es)? Yes Addressing deficits in the following areas: balance, endurance, locomotion, strength, transferring, bowel/bladder control, bathing, dressing, feeding, grooming, toileting, and psychosocial support Can the patient actively participate in an intensive therapy program of at least 3 hrs of therapy 5 days a week? Yes The potential for patient to make measurable gains while on inpatient rehab is good Anticipated functional outcomes upon discharge from inpatient rehab: supervision PT, supervision OT, supervision SLP Estimated rehab length of stay to reach the above functional goals is: 9-12d Anticipated discharge destination: Home 10. Overall Rehab/Functional Prognosis: good     MD Signature: Charlett Blake M.D. [employer] Fellow Am Acad of Phys Med and Rehab Diplomate Am Board of Electrodiagnostic Med Fellow Am Board of Interventional Pain

## 2021-11-27 NOTE — Progress Notes (Signed)
Patient ID: Erika [name], female   DOB: 1971-06-03, 50 y.o.   MRN: [id number] Met with the patient to review rehab routine, therapy schedule, team conference and plan of care. Patient noted she had been doing fairly well at home. Toileting to/from [facility] next to a chair and lost her balance. Daughter able to assist with clothes management however she cannot stop patient from falling nor assist out of the floor. Called EMS. Patient noted she did not have on shoe with the AFO when she fell/twisted ankle; fracture treated with aircast as a sprain per MD.  Reviewed secondary risks, UTI and HLD (Trig 636); patient  notes hereditary and recent zetia with crestor. Reports spouse manages meds. Patient noted that her plan is to go to [facility] in [place] post CIR for a few weeks before going back home.  Continue to follow along to discharge to address educational needs, review medications and dietary modification recommendations. Margarito Liner, RN

## 2021-11-28 ENCOUNTER — Ambulatory Visit: Payer: Managed Care, Other (non HMO)

## 2021-11-28 ENCOUNTER — Encounter: Payer: Self-pay | Admitting: Physical Medicine and Rehabilitation

## 2021-11-28 ENCOUNTER — Encounter: Payer: Managed Care, Other (non HMO) | Admitting: Speech Pathology

## 2021-11-28 DIAGNOSIS — J189 Pneumonia, unspecified organism: Secondary | ICD-10-CM | POA: Diagnosis not present

## 2021-11-28 LAB — COMPREHENSIVE METABOLIC PANEL
ALT: 27 U/L (ref 0–44)
AST: 22 U/L (ref 15–41)
Albumin: 3.3 g/dL — ABNORMAL LOW (ref 3.5–5.0)
Alkaline Phosphatase: 77 U/L (ref 38–126)
Anion gap: 9 (ref 5–15)
BUN: 12 mg/dL (ref 6–20)
CO2: 32 mmol/L (ref 22–32)
Calcium: 10.2 mg/dL (ref 8.9–10.3)
Chloride: 97 mmol/L — ABNORMAL LOW (ref 98–111)
Creatinine, Ser: 0.94 mg/dL (ref 0.44–1.00)
GFR, Estimated: >60 mL/min (ref >60)
Glucose, Bld: 88 mg/dL (ref 70–99)
Potassium: 3.9 mmol/L (ref 3.5–5.1)
Sodium: 138 mmol/L (ref 135–145)
Total Bilirubin: 0.2 mg/dL — ABNORMAL LOW (ref 0.3–1.2)
Total Protein: 6.7 g/dL (ref 6.5–8.1)

## 2021-11-28 LAB — CBC WITH DIFFERENTIAL/PLATELET
Abs Immature Granulocytes: 0.18 10*3/uL — ABNORMAL HIGH (ref 0.00–0.07)
Basophils Absolute: 0.1 10*3/uL (ref 0.0–0.1)
Basophils Relative: 1 %
Eosinophils Absolute: 0.3 10*3/uL (ref 0.0–0.5)
Eosinophils Relative: 4 %
HCT: 40.2 % (ref 36.0–46.0)
Hemoglobin: 13.1 g/dL (ref 12.0–15.0)
Immature Granulocytes: 2 %
Lymphocytes Relative: 31 %
Lymphs Abs: 2.9 10*3/uL (ref 0.7–4.0)
MCH: 27.5 pg (ref 26.0–34.0)
MCHC: 32.6 g/dL (ref 30.0–36.0)
MCV: 84.5 fL (ref 80.0–100.0)
Monocytes Absolute: 0.5 10*3/uL (ref 0.1–1.0)
Monocytes Relative: 6 %
Neutro Abs: 5.3 10*3/uL (ref 1.7–7.7)
Neutrophils Relative %: 56 %
Platelets: 378 10*3/uL (ref 150–400)
RBC: 4.76 MIL/uL (ref 3.87–5.11)
RDW: 13.9 % (ref 11.5–15.5)
WBC: 9.3 10*3/uL (ref 4.0–10.5)
nRBC: 0 % (ref 0.0–0.2)

## 2021-11-28 LAB — GLUCOSE, CAPILLARY
Glucose-Capillary: 105 mg/dL — ABNORMAL HIGH (ref 70–99)
Glucose-Capillary: 89 mg/dL (ref 70–99)
Glucose-Capillary: 118 mg/dL — ABNORMAL HIGH (ref 70–99)
Glucose-Capillary: 106 mg/dL — ABNORMAL HIGH (ref 70–99)
Glucose-Capillary: 127 mg/dL — ABNORMAL HIGH (ref 70–99)

## 2021-11-28 MED ORDER — PANTOPRAZOLE SODIUM 40 MG PO TBEC: 40.0000 mg | DELAYED_RELEASE_TABLET | Freq: Every day | ORAL | 0 refills | Status: DC

## 2021-11-28 MED ORDER — ICOSAPENT ETHYL 1 G PO CAPS: 2.0000 g | ORAL_CAPSULE | Freq: Two times a day (BID) | ORAL | Status: DC

## 2021-11-28 NOTE — Progress Notes (Signed)
Occupational Therapy Session Note  Patient Details  Name: DONELLA PASCARELLA MRN: [id number] Date of Birth: Sep 30, 1971  Today's Date: 11/28/2021 OT Individual Time: 9021-1155 OT Individual Time Calculation (min): 29 min    Short Term Goals: Week 1:  OT Short Term Goal 1 (Week 1): Patient will don UB clothing with set-up assist with use of hemi technique. OT Short Term Goal 2 (Week 1): Patient will complete toilet transfer via squat-pivot and use of hemi-walker. OT Short Term Goal 3 (Week 1): Patient will complete 1/3 parts of LB dressing with Mod A.  Skilled Therapeutic Interventions/Progress Updates:  Pt received asleep in supine but able to arouse with environmental stimulus. Pt agreeable to OT intervention. Pt declined ADL needs but agreeable to functional mobility,pt completed supine>sit with MODA needing assist for trunk mgmt and to maneuver LLE to EOB. Per orders noted air cast vs CAM boot, pt reports CAM boot was ordered, called PA Dan who reported per ortho, air cast is enough as fxt is avulsion fxt and does not require CAM boot. Updated pt on orders and updated OT sticky note. Education provided on overall POC for rehab as well as goal level for DC based on earlier OT eval, pt agreeable to MIN A goals and reports her personal goal is go home walking. Returned to supine with MIN A. Pt left supine in bed with bed alarm activated and all needs within reach.   Therapy Documentation Precautions:  Precautions Precautions: Fall Precaution Comments: Hx recurrent falls Required Braces or Orthoses: Other Brace Other Brace: CAM boot on LLE Restrictions Weight Bearing Restrictions: Yes LLE Weight Bearing: Weight bearing as tolerated Other Position/Activity Restrictions: WBAT LLE with air cast or CAM boot?   Pain: unrated pain in L ankle reported, rest breaks and repositioning provided as needed.     Therapy/Group: Individual Therapy  Precious Haws 11/28/2021, 3:59 PM

## 2021-11-28 NOTE — Progress Notes (Signed)
Inpatient Rehabilitation Care Coordinator Assessment and Plan Patient Details  Name: Erika [name] MRN: [id number] Date of Birth: Jun 19, 1971  Today's Date: 11/28/2021  Hospital Problems: Principal Problem:   CAP (community acquired pneumonia) Active Problems:   Debility  Past Medical History:  Past Medical History:  Diagnosis Date   Arthralgia of temporomandibular joint    CAD, multiple vessel    a. 06/2016 Cath: ostLM 40%, ostLAD 40%, pLAD 95%, ost-pLCx 60%, pLCx 95%, mLCx 60%, mRCA 95%, D2 50%, LVSF nl;  b. 07/2016 CABG x 4 (LIMA->LAD, VG->Diag, VG->OM, VG->RCA); c. 08/2016 Cath: 3VD w/ 4/4 patent grafts. LAD distal to LIMA has diff dzs->Med rx; d. 08/2020 Cath: 4/4 patent grafts, native 3VD. EF 55-65%-->Med Rx.   Carotid arterial disease ([place])    a. 07/2016 s/p R CEA; b. 02/2021 U/S: RICA 81-82%, LICA 9-93%.   Clotting disorder ([place])    Depression    Diastolic dysfunction    a. 06/2016 Echo: EF 50-55%, mild inf wall HK, GR1DD, mild MR, RV sys fxn nl, mildly dilated LA, PASP nl   Fatty liver disease, nonalcoholic 7169   History of blood transfusion    with heart surgery   HLD (hyperlipidemia)    Labile hypertension    a. prior renal ngiogram negative for RAS in 03/2016; b. catecholamines and metanephrines normal, mildly elevated renin with normal aldosterone and normal ratio in 02/2016   Myocardial infarction [facility]) 2017   Obesity    PAD (peripheral artery disease) ([place])    a. 09/2018 s/p L SFA stenting; b. 07/2019 Periph Angio: Patent m/d L SFA stent w/ 100% L SFA distal to stent. L AT 100d, L Peroneal diff dzs-->Med Rx; c. 02/2021 ABIs: stable @ 0.61 on R and 0.46 on L.   PTSD (post-traumatic stress disorder)    Tobacco abuse    Type 2 diabetes mellitus ([place]) 12/2015   Past Surgical History:  Past Surgical History:  Procedure Laterality Date   ABDOMINAL AORTOGRAM W/LOWER EXTREMITY [name] 10/15/2018   Procedure: ABDOMINAL AORTOGRAM W/LOWER EXTREMITY;  Surgeon: Wellington Hampshire,  MD;  Location: [place] [facility];  Service: Cardiovascular;  Laterality: [name];   ABDOMINAL AORTOGRAM W/LOWER EXTREMITY Bilateral 08/19/2019   Procedure: ABDOMINAL AORTOGRAM W/LOWER EXTREMITY;  Surgeon: Wellington Hampshire, MD;  Location: [place] [facility];  Service: Cardiovascular;  Laterality: Bilateral;   CARDIAC CATHETERIZATION [name] 06/29/2016   Procedure: Left Heart Cath and Coronary Angiography;  Surgeon: Minna Merritts, MD;  Location: [place] [facility];  Service: Cardiovascular;  Laterality: [name];   CARDIAC CATHETERIZATION [name] 08/29/2016   Procedure: Left Heart Cath and Cors/Grafts Angiography;  Surgeon: Wellington Hampshire, MD;  Location: [place] [facility];  Service: Cardiovascular;  Laterality: [name];   CESAREAN SECTION     CHOLECYSTECTOMY     CORONARY ARTERY BYPASS GRAFT [name] 07/06/2016   Procedure: CORONARY ARTERY BYPASS GRAFTING (CABG) x four, using left internal mammary artery and right leg greater saphenous vein harvested endoscopically;  Surgeon: Ivin Poot, MD;  Location: [place];  Service: Open Heart Surgery;  Laterality: [name];   ENDARTERECTOMY Right 07/06/2016   Procedure: ENDARTERECTOMY CAROTID;  Surgeon: Rosetta Posner, MD;  Location: [facility];  Service: Vascular;  Laterality: Right;   ENDARTERECTOMY Right 04/27/2020   Procedure: REDO OF RIGHT ENDARTERECTOMY CAROTID;  Surgeon: Rosetta Posner, MD;  Location: [facility] OR;  Service: Vascular;  Laterality: Right;   LEFT HEART CATH AND CORS/GRAFTS ANGIOGRAPHY [name] 08/24/2020   Procedure: LEFT HEART CATH AND CORS/GRAFTS  ANGIOGRAPHY;  Surgeon: Wellington Hampshire, MD;  Location: [place] [facility];  Service: Cardiovascular;  Laterality: [name];   PERIPHERAL VASCULAR CATHETERIZATION [name] 04/18/2016   Procedure: Renal Angiography;  Surgeon: Wellington Hampshire, MD;  Location: [place] [facility];  Service: Cardiovascular;  Laterality: [name];   PERIPHERAL VASCULAR INTERVENTION Left 10/15/2018   Procedure: PERIPHERAL VASCULAR INTERVENTION;  Surgeon: Wellington Hampshire, MD;   Location: [place] [facility];  Service: Cardiovascular;  Laterality: Left;  Left superficial femoral   TEE WITHOUT CARDIOVERSION [name] 07/06/2016   Procedure: TRANSESOPHAGEAL ECHOCARDIOGRAM (TEE);  Surgeon: Ivin Poot, MD;  Location: [place];  Service: Open Heart Surgery;  Laterality: [name];   TONSILLECTOMY     Social History:  reports that she has quit smoking. Her smoking use included cigarettes. She has a 13.50 pack-year smoking history. She has never used smokeless tobacco. She reports that she does not currently use alcohol. She reports that she does not use drugs.  Family / Support Systems Marital Status: Married Patient Roles: Spouse, Parent, Other (Comment) (daughter in-law) Spouse/Significant Other: [name] [phone]  [phone] Children: [name] lives with them but has special needs of her own but can assist some Other Supports: Elaine-mother in-law [phone] Anticipated Caregiver: Husband, moher in-law and daughter Ability/Limitations of Caregiver: Husband works and his Mom can stay but only provide supervision level. Daughter can assist but is limited Caregiver Availability: 24/7 Family Dynamics: Close knit family who have been providing care to pt for the past few months since discharge from [facility] 05/[facility]21. Pt has declined recently due to pneumonia  Social History Preferred language: English Religion: Christian Cultural Background: NO issues Education: [place] - How often do you need to have someone help you when you read instructions, pamphlets, or other written material from your doctor or pharmacy?: Sometimes Writes: Yes Employment Status: Disabled Public relations account executive Issues: No issues Guardian/Conservator: None-according to MD pt is capable of making her own decisions while here. Husband is very involved with her care and here daily   Abuse/Neglect Abuse/Neglect Assessment Can Be Completed: Yes Physical Abuse: Denies Verbal Abuse: Denies Sexual  Abuse: Denies Exploitation of patient/patient's resources: Denies Self-Neglect: Denies  Patient response to: Social Isolation - How often do you feel lonely or isolated from those around you?: Rarely  Emotional Status Pt's affect, behavior and adjustment status: Pt is motivated to improve and get back to her level she was prior to becoming ill. She wants to be as high level as possible-at least supervision level so as not to bother/burden family Recent Psychosocial Issues: other health issues Psychiatric History: History of depression feels well controlled on meds and saw psychiatrist when at [facility] one as an OP Dr. Adele Schilder Substance Abuse History: No issues  Patient / Family Perceptions, Expectations & Goals Pt/Family understanding of illness & functional limitations: Pt and family can explain her reason for being here and hopeful she will improve. Husband and pt speak with the MD and feel have a good understanding of her treatment plan going forward. Premorbid pt/family roles/activities: wife, mom, retiree, daughter in-law, neighbor and etc Anticipated changes in roles/activities/participation: resume Pt/family expectations/goals: Pt states: " I am glad to be back here I will get the therapy I need."  Husband states: " I hope she does well here I need her too and get back to the level she was at before got sick."  [employer]: Other (Comment) (OP-Psych) Premorbid Home Care/DME Agencies: Other (Comment) (has wheelchair, bsc, tub bench, rw  and went to OP rehab after last admit) Transportation available at discharge: Family Is the patient able to respond to transportation needs?: Yes In the past 12 months, has lack of transportation kept you from medical appointments or from getting medications?: Yes In the past 12 months, has lack of transportation kept you from meetings, work, or from getting things needed for daily living?: Yes  Discharge Planning Living  Arrangements: Spouse/significant other, Children Support Systems: Spouse/significant other, Children, Other relatives, Friends/neighbors Type of Residence: Private residence Insurance Resources: Multimedia programmer (specify), Medicare Psychologist, counselling) Financial Resources: SSD, Family Support Financial Screen Referred: No Living Expenses: Medical laboratory scientific officer Management: Spouse Does the patient have any problems obtaining your medications?: No Home Management: Family Patient/Family Preliminary Plans: Return home with husband, mother in-law and daughter. Between all they can provide 24/7 supervision. Husband does work and is out of the home during the day and M-I-L will be there with pt, but needs her to be supervision level Care Coordinator Barriers to Discharge: Insurance for SNF coverage Care Coordinator Anticipated Follow Up Needs: HH/OP  Clinical Impression Pt is motivated to be back here and wants to do well. Husband is also glad got a bed here and hopeful she will progress well. She was falling at home prior to admission and concerned about this. Aware of rehab process since just here in June 2022. See's OP psychiatrist for her mental health issues. Await team evaluations and work on discharge plan.  Elease Hashimoto 11/28/2021, 10:12 AM

## 2021-11-28 NOTE — Telephone Encounter (Signed)
Admitted

## 2021-11-28 NOTE — Plan of Care (Signed)
  Problem: RH Problem Solving Goal: LTG Patient will demonstrate problem solving for (SLP) Description: LTG:  Patient will demonstrate problem solving for basic/complex daily situations with cues  (SLP) Flowsheets (Taken 11/28/2021 1346) LTG: Patient will demonstrate problem solving for (SLP): Complex daily situations LTG Patient will demonstrate problem solving for: Supervision   Problem: RH Memory Goal: LTG Patient will use memory compensatory aids to (SLP) Description: LTG:  Patient will use memory compensatory aids to recall biographical/new, daily complex information with cues (SLP) Flowsheets (Taken 11/28/2021 1346) LTG: Patient will use memory compensatory aids to (SLP): Supervision   Problem: RH Attention Goal: LTG Patient will demonstrate this level of attention during functional activites (SLP) Description: LTG:  Patient will will demonstrate this level of attention during functional activites (SLP) Flowsheets (Taken 11/28/2021 1346) Patient will demonstrate during cognitive/linguistic activities the attention type of: Selective Patient will demonstrate this level of attention during cognitive/linguistic activities in: Home LTG: Patient will demonstrate this level of attention during cognitive/linguistic activities with assistance of (SLP): Supervision

## 2021-11-28 NOTE — Progress Notes (Signed)
Inpatient Rehabilitation Center Individual Statement of Services  Patient Name:  Erika [name]  Date:  11/28/2021  Welcome to the [facility].  Our goal is to provide you with an individualized program based on your diagnosis and situation, designed to meet your specific needs.  With this comprehensive rehabilitation program, you will be expected to participate in at least 3 hours of rehabilitation therapies Monday-Friday, with modified therapy programming on the weekends.  Your rehabilitation program will include the following services:  Physical Therapy (PT), Occupational Therapy (OT), 24 hour per day rehabilitation nursing, Therapeutic Recreaction (TR), Care Coordinator, Rehabilitation Medicine, Nutrition Services, and Pharmacy Services  Weekly team conferences will be held on Tuesday to discuss your progress.  Your Inpatient Rehabilitation Care Coordinator will talk with you frequently to get your input and to update you on team discussions.  Team conferences with you and your family in attendance may also be held.  Expected length of stay: 14 days  Overall anticipated outcome: min level  Depending on your progress and recovery, your program may change. Your Inpatient Rehabilitation Care Coordinator will coordinate services and will keep you informed of any changes. Your Inpatient Rehabilitation Care Coordinator's name and contact numbers are listed  below.  The following services may also be recommended but are not provided by the [place]:   [place] will be made to provide these services after discharge if needed.  Arrangements include referral to agencies that provide these services.  Your insurance has been verified to be:  [place] and medicare part A Your primary doctor is:  Waunita Schooner  Pertinent information will be shared with your doctor and your  insurance company.  Inpatient Rehabilitation Care Coordinator:  Ovidio Kin, Paul or Emilia Beck  Information discussed with and copy given to patient by: Elease Hashimoto, 11/28/2021, 10:16 AM

## 2021-11-28 NOTE — Plan of Care (Signed)
  Problem: RH Balance Goal: LTG Patient will maintain dynamic standing balance (PT) Description: LTG:  Patient will maintain dynamic standing balance with assistance during mobility activities (PT) Flowsheets (Taken 11/28/2021 1629) LTG: Pt will maintain dynamic standing balance during mobility activities with:: (LRAD) Minimal Assistance - Patient > 75%   Problem: Sit to Stand Goal: LTG:  Patient will perform sit to stand with assistance level (PT) Description: LTG:  Patient will perform sit to stand with assistance level (PT) Flowsheets (Taken 11/28/2021 1629) LTG: PT will perform sit to stand in preparation for functional mobility with assistance level: (LRAD) Minimal Assistance - Patient > 75%   Problem: RH Bed Mobility Goal: LTG Patient will perform bed mobility with assist (PT) Description: LTG: Patient will perform bed mobility with assistance, with/without cues (PT). Flowsheets (Taken 11/28/2021 1629) LTG: Pt will perform bed mobility with assistance level of: Supervision/Verbal cueing   Problem: RH Bed to Chair Transfers Goal: LTG Patient will perform bed/chair transfers w/assist (PT) Description: LTG: Patient will perform bed to chair transfers with assistance (PT). Flowsheets (Taken 11/28/2021 1629) LTG: Pt will perform Bed to Chair Transfers with assistance level: (LRAD) Minimal Assistance - Patient > 75%   Problem: RH Car Transfers Goal: LTG Patient will perform car transfers with assist (PT) Description: LTG: Patient will perform car transfers with assistance (PT). Flowsheets (Taken 11/28/2021 1629) LTG: Pt will perform car transfers with assist:: (LRAD) Minimal Assistance - Patient > 75%   Problem: RH Ambulation Goal: LTG Patient will ambulate in home environment (PT) Description: LTG: Patient will ambulate in home environment, # of feet with assistance (PT). Flowsheets (Taken 11/28/2021 1629) LTG: Pt will ambulate in home environ  assist needed:: (LRAD) Minimal Assistance -  Patient > 75% LTG: Ambulation distance in home environment: 57'

## 2021-11-28 NOTE — Progress Notes (Signed)
Patient ID: Erika [name], female   DOB: Mar 10, 1971, 50 y.o.   MRN: [id number] Pt voiced she is going to a [facility] from here and I informed her [facility]ella Scheuermann pays for here or NH not both. She asked once they switch insurance on 12/24/2021 can she go. Discussed she will not be here on 1/1 and would need to get prior auth for this with new insurance. Unsure reason she came here versus going straight to a NH. Pt reports her care is more than her daughter can provide and mother in-law can only do supervision level

## 2021-11-28 NOTE — Progress Notes (Signed)
PROGRESS NOTE   Subjective/Complaints: Sleepy this morning She discussed with Jacqlyn Larsen going to nursing home from here and [place] informed that insurance will only pay for one level of stay  ROS: +headaches  Objective:   No results found. Recent Labs    11/27/21 1506 11/28/21 0531  WBC 8.9 9.3  HGB 12.8 13.1  HCT 38.9 40.2  PLT 373 378   Recent Labs    11/27/21 1007 11/27/21 1506 11/28/21 0531  NA 135  --  138  K 4.0  --  3.9  CL 98  --  97*  CO2 29  --  32  GLUCOSE 216*  --  88  BUN 16  --  12  CREATININE 0.87 0.97 0.94  CALCIUM 10.1  --  10.2    Intake/Output Summary (Last 24 hours) at 11/28/2021 1244 Last data filed at 11/28/2021 0819 Gross per 24 hour  Intake 2875 ml  Output --  Net 2875 ml        Physical Exam: Vital Signs Blood pressure (!) 157/94, pulse 86, temperature 98 F (36.7 C), resp. rate 18, height 5\' 7"  (1.702 m), weight 98.9 kg, SpO2 93 %. Gen: no distress, normal appearing HEENT: oral mucosa pink and moist, NCAT Cardio: Reg rate Chest: normal effort, normal rate of breathing Abd: soft, non-distended Ext: no edema Psych: pleasant, normal affect Skin: intact Neurologic: Cranial nerves II through XII intact, motor strength is 5/5 in RIght and 3-/5 left deltoid, bicep, tricep, grip, hip flexor, knee extensors, ankle dorsiflexor and plantar flexor Sensory exam normal sensation to light touch and proprioception in bilateral upper and lower extremities Tone- increased Left finger and wrist flexor tone, and left hip adductor tone  Musculoskeletal: Full range of motion in all 4 extremities. No joint swelling   Assessment/Plan: 1. Functional deficits which require 3+ hours per day of interdisciplinary therapy in a comprehensive inpatient rehab setting. Physiatrist is providing close team supervision and 24 hour management of active medical problems listed below. Physiatrist and rehab team  continue to assess barriers to discharge/monitor patient progress toward functional and medical goals  Care Tool:  Bathing  Bathing activity did not occur: Refused           Bathing assist       Upper Body Dressing/Undressing Upper body dressing Upper body dressing/undressing activity did not occur (including orthotics): Refused      Upper body assist      Lower Body Dressing/Undressing Lower body dressing    Lower body dressing activity did not occur: Refused       Lower body assist       Toileting Toileting    Toileting assist Assist for toileting: Dependent - Patient 0% Charlaine Dalton)     Transfers Chair/bed transfer  Transfers assist           Locomotion Ambulation   Ambulation assist              Walk 10 feet activity   Assist           Walk 50 feet activity   Assist           Walk 150 feet activity  Assist           Walk 10 feet on uneven surface  activity   Assist           Wheelchair     Assist               Wheelchair 50 feet with 2 turns activity    Assist            Wheelchair 150 feet activity     Assist          Blood pressure (!) 157/94, pulse 86, temperature 98 F (36.7 C), resp. rate 18, height 5\' 7"  (1.702 m), weight 98.9 kg, SpO2 93 %.    Medical Problem List and Plan: 1.  Debility functional deficits secondary to acute hypoxic respiratory failure/community-acquired pneumonia.  Antibiotic therapy completed.  Wean oxygen to room air             -patient may  shower             -ELOS/Goals: 9-12d, sup/minA goals  -Continue CIR 2.  Impaired mobility, lateral steps: continue Lovenox             -antiplatelet therapy: Aspirin 81 mg daily, Plavix 75 mg daily 3. Pain Management/chronic pain: Continue Lyrica 225 mg twice daily, hydrocodone as needed, Flexeril 10 mg nightly, Advil as needed 4. Mood/PTSD: Continue Lexapro 20 mg daily, Lamictal 200 mg nightly, Thorazine 100 mg  nightly, Cogentin 0.5 mg nightly, Klonopin 0.5 mg twice daily             -antipsychotic agents: [name] 5. Neuropsych: This patient is capable of making decisions on her own behalf. 6. Skin/Wound Care: Routine skin checks 7. Fluids/Electrolytes/Nutrition: Routine in and outs with follow-up chemistries 8.  Avulsion fracture medial malleolus left foot status post fall.  [facility] orthopedic service Dr. Roland Rack.  Aircast as needed.  Weightbearing as tolerated 9.  History of right pontine infarction.  Patient received CIR July 2022.  Continue aspirin and Plavix 10.  CAD/CABG.  No chest pain or shortness of breath.  Continue Plavix/aspirin 11.  Hypertension.  Coreg 6.25 mg twice daily, Imdur 60 mg twice daily, Aldactone 25 mg daily, Demadex 10 mg daily.  Monitor with increased mobility 12.  Diabetes mellitus.  Hemoglobin A1c 7.0.  Semglee 54 units daily.  Check blood sugars before meals and at bedtime 13.  Diastolic congestive heart failure.  Continue Demadex 10 mg daily.  Monitor for any signs of fluid overload 14.  Hyperlipidemia.  Crestor/Zetia 15.  GERD.  Protonix 16.  Constipation.  MiraLAX twice daily, Senokot S2 tablets twice daily 17.  Spasticity related to CVA , may benefit from Botox as Outpt  LOS: 1 days A FACE TO FACE EVALUATION WAS PERFORMED  Clide Deutscher Yumi Insalaco 11/28/2021, 12:44 PM

## 2021-11-28 NOTE — Evaluation (Signed)
Speech Language Pathology Assessment and Plan  Patient Details  Name: Erika [name] MRN: [id number] Date of Birth: 05/14/1971  SLP Diagnosis: Cognitive Impairments  Rehab Potential: Good ELOS: 2 weeks   Today's Date: 11/28/2021 SLP Individual Time: 1300-1350 SLP Individual Time Calculation (min): 50 min  Hospital Problem: Principal Problem:   CAP (community acquired pneumonia) Active Problems:   Debility  Past Medical History:  Past Medical History:  Diagnosis Date   Arthralgia of temporomandibular joint    CAD, multiple vessel    a. 06/2016 Cath: ostLM 40%, ostLAD 40%, pLAD 95%, ost-pLCx 60%, pLCx 95%, mLCx 60%, mRCA 95%, D2 50%, LVSF nl;  b. 07/2016 CABG x 4 (LIMA->LAD, VG->Diag, VG->OM, VG->RCA); c. 08/2016 Cath: 3VD w/ 4/4 patent grafts. LAD distal to LIMA has diff dzs->Med rx; d. 08/2020 Cath: 4/4 patent grafts, native 3VD. EF 55-65%-->Med Rx.   Carotid arterial disease ([place])    a. 07/2016 s/p R CEA; b. 02/2021 U/S: RICA 50-93%, LICA 2-67%.   Clotting disorder ([place])    Depression    Diastolic dysfunction    a. 06/2016 Echo: EF 50-55%, mild inf wall HK, GR1DD, mild MR, RV sys fxn nl, mildly dilated LA, PASP nl   Fatty liver disease, nonalcoholic 1245   History of blood transfusion    with heart surgery   HLD (hyperlipidemia)    Labile hypertension    a. prior renal ngiogram negative for RAS in 03/2016; b. catecholamines and metanephrines normal, mildly elevated renin with normal aldosterone and normal ratio in 02/2016   Myocardial infarction [facility]) 2017   Obesity    PAD (peripheral artery disease) ([place])    a. 09/2018 s/p L SFA stenting; b. 07/2019 Periph Angio: Patent m/d L SFA stent w/ 100% L SFA distal to stent. L AT 100d, L Peroneal diff dzs-->Med Rx; c. 02/2021 ABIs: stable @ 0.61 on R and 0.46 on L.   PTSD (post-traumatic stress disorder)    Tobacco abuse    Type 2 diabetes mellitus ([place]) 12/2015   Past Surgical History:  Past Surgical History:  Procedure Laterality  Date   ABDOMINAL AORTOGRAM W/LOWER EXTREMITY [name] 10/15/2018   Procedure: ABDOMINAL AORTOGRAM W/LOWER EXTREMITY;  Surgeon: Wellington Hampshire, MD;  Location: [place] [facility];  Service: Cardiovascular;  Laterality: [name];   ABDOMINAL AORTOGRAM W/LOWER EXTREMITY Bilateral 08/19/2019   Procedure: ABDOMINAL AORTOGRAM W/LOWER EXTREMITY;  Surgeon: Wellington Hampshire, MD;  Location: [place] [facility];  Service: Cardiovascular;  Laterality: Bilateral;   CARDIAC CATHETERIZATION [name] 06/29/2016   Procedure: Left Heart Cath and Coronary Angiography;  Surgeon: Minna Merritts, MD;  Location: [place] [facility];  Service: Cardiovascular;  Laterality: [name];   CARDIAC CATHETERIZATION [name] 08/29/2016   Procedure: Left Heart Cath and Cors/Grafts Angiography;  Surgeon: Wellington Hampshire, MD;  Location: [place] [facility];  Service: Cardiovascular;  Laterality: [name];   CESAREAN SECTION     CHOLECYSTECTOMY     CORONARY ARTERY BYPASS GRAFT [name] 07/06/2016   Procedure: CORONARY ARTERY BYPASS GRAFTING (CABG) x four, using left internal mammary artery and right leg greater saphenous vein harvested endoscopically;  Surgeon: Ivin Poot, MD;  Location: [place];  Service: Open Heart Surgery;  Laterality: [name];   ENDARTERECTOMY Right 07/06/2016   Procedure: ENDARTERECTOMY CAROTID;  Surgeon: Rosetta Posner, MD;  Location: [place];  Service: Vascular;  Laterality: Right;   ENDARTERECTOMY Right 04/27/2020   Procedure: REDO OF RIGHT ENDARTERECTOMY CAROTID;  Surgeon: Rosetta Posner, MD;  Location: [place];  Service: Vascular;  Laterality: Right;   LEFT HEART CATH AND CORS/GRAFTS ANGIOGRAPHY [name] 08/24/2020   Procedure: LEFT HEART CATH AND CORS/GRAFTS ANGIOGRAPHY;  Surgeon: Wellington Hampshire, MD;  Location: [place] [facility];  Service: Cardiovascular;  Laterality: [name];   PERIPHERAL VASCULAR CATHETERIZATION [name] 04/18/2016   Procedure: Renal Angiography;  Surgeon: Wellington Hampshire, MD;  Location: [place] [facility];  Service: Cardiovascular;   Laterality: [name];   PERIPHERAL VASCULAR INTERVENTION Left 10/15/2018   Procedure: PERIPHERAL VASCULAR INTERVENTION;  Surgeon: Wellington Hampshire, MD;  Location: [place] [facility];  Service: Cardiovascular;  Laterality: Left;  Left superficial femoral   TEE WITHOUT CARDIOVERSION [name] 07/06/2016   Procedure: TRANSESOPHAGEAL ECHOCARDIOGRAM (TEE);  Surgeon: Ivin Poot, MD;  Location: [place];  Service: Open Heart Surgery;  Laterality: [name];   TONSILLECTOMY      Assessment / Plan / Recommendation Clinical Impression  Patient is a 50 y.o. year old female with with history of right pontine infarction receiving inpatient rehab services 06/28/2021 - 08/01/2021, CAD status post CABG followed by cardiology services Dr. Jacqulyn Cane, multiple UTIs, right CEA x2, diabetes mellitus, fatty liver, chronic pain, PTSD maintained on Lexapro as well as Cogentin/Lamictal and Klonopin, diastolic congestive heart failure, hyperlipidemia, obesity with BMI 34.15, history of tobacco use.  Per chart review patient lives with spouse and daughter who is special needs.  1 level home ramped entrance.  Patient needed moderate assist for mobility using hemiwalker/minimal assist for ADLs as well as reported multiple falls in the past month.  Presented to [facility]/21/2021 with worsening cough x1 week.  Denied any fever or chills.  She had had a mechanical fall day prior to admission with left foot pain.  Husband had reported mental status changes. CXR showed patchy opacities of the left greater than right lung base suspicious for pneumonia.  Left ankle film showed age-indeterminate avulsion fracture from the tip of the medial malleolus. MRI of the brain no acute intracranial abnormality.  Old right occipital and parietal cortical infarcts.  Follow-up for psychiatry services for history of major depressive disorder/PTSD and patient remains on Cogentin/Klonopin/Lamictal.  Tolerating a regular consistency diet.  Therapy evaluations completed due to  patient decreased functional mobility was admitted for a comprehensive rehab program.  Pt presents with mild cognitive impairment as evidenced from SLUMS score 21/30 [facility] = 27+). Most recent SLUMS score per EMR was 07/28/21 in CIR with a score of 24/30. Pt continues with deficits in short term recall and problem solving. Pt has been receiving OPST with focus on decreasing impulsivity and utilizing external aids to increase functional recall. Pt does report a decline in short term memory since fall and hospitalization, reports word finding difficulty which was not evident at conversation level during this ST assessment. Pt with mild impulsivity during evaluation, attempting to answer questions at times before SLP finished with prompt. Pt oriented x4 and can detail daily schedule at home including adaptive devices and choices to increase independence and safety. Pt with mild dysarthria which is baseline s/p CVA, intelligibility 100%. Pt will benefit from ST services in CIR to increase independence and safety with daily routine as well as decrease caregiver burden in home environment.    Skilled Therapeutic Interventions          Pt participating in [place] Mental Status Examination as well as further non-standardized assessments of cognitive status.   SLP Assessment  Patient will need skilled Speech Lanaguage Pathology Services during CIR admission  Recommendations  Oral Care Recommendations: Oral care BID Recommendations for Other Services: Neuropsych consult Patient destination: Home Follow up Recommendations: Outpatient SLP Equipment Recommended: None recommended by SLP    SLP Frequency 1 to 3 out of 7 days   SLP Duration  SLP Intensity  SLP Treatment/Interventions 2 weeks  Minumum of 1-2 x/day, 30 to 90 minutes  Cognitive remediation/compensation;Cueing hierarchy;Functional tasks;Patient/family education;Therapeutic Activities;Therapeutic Exercise;Internal/external aids     Pain Pain Assessment Pain Scale: 0-10 Pain Score: 0-No pain  Prior Functioning Cognitive/Linguistic Baseline: Baseline deficits Baseline deficit details: mild cognitive deficits following previous CVA Type of Home: House  Lives With: Spouse;Daughter Available Help at Discharge: Family;Available PRN/intermittently  SLP Evaluation Cognition Overall Cognitive Status: History of cognitive impairments - at baseline Arousal/Alertness: Awake/alert Orientation Level: Oriented X4 Year: 2022 Month: December Day of Week: Correct Attention: Focused;Selective Focused Attention: Appears intact Selective Attention: Impaired Selective Attention Impairment: Verbal complex;Functional complex Memory: Impaired Memory Impairment: Retrieval deficit Awareness: Appears intact Problem Solving: Appears intact Safety/Judgment: Appears intact  Comprehension Auditory Comprehension Overall Auditory Comprehension: Appears within functional limits for tasks assessed Expression Expression Primary Mode of Expression: Verbal Verbal Expression Overall Verbal Expression: Appears within functional limits for tasks assessed Oral Motor Oral Motor/Sensory Function Overall Oral Motor/Sensory Function: Mild impairment  Care Tool Care Tool Cognition Ability to hear (with hearing aid or hearing appliances if normally used Ability to hear (with hearing aid or hearing appliances if normally used): 0. Adequate - no difficulty in normal conservation, social interaction, listening to TV   Expression of Ideas and Wants Expression of Ideas and Wants: 3. Some difficulty - exhibits some difficulty with expressing needs and ideas (e.g, some words or finishing thoughts) or speech is not clear   Understanding Verbal and Non-Verbal Content Understanding Verbal and Non-Verbal Content: 3. Usually understands - understands most conversations, but misses some part/intent of message. Requires cues at times to understand   Memory/Recall Ability Memory/Recall Ability : Current season;That he or she is in a hospital/hospital unit   Short Term Goals: Week 1: SLP Short Term Goal 1 (Week 1): Pt will use external aids to organize/recall appointments, daily schedules for family and medical information with min A SLP Short Term Goal 2 (Week 1): Pt will verbalize 3-4 sequencing steps for transfers/gait training with min A to reduce impulsivity and risk of falls SLP Short Term Goal 3 (Week 1): Pt will complete moderately complex problem solving tasks with min A SLP Short Term Goal 4 (Week 1): Pt will participate in selective attention tasks provided min A verbal and visual cues  Refer to Care Plan for Long Term Goals  Recommendations for other services: Neuropsych  Discharge Criteria: Patient will be discharged from SLP if patient refuses treatment 3 consecutive times without medical reason, if treatment goals not met, if there is a change in medical status, if patient makes no progress towards goals or if patient is discharged from hospital.  The above assessment, treatment plan, treatment alternatives and goals were discussed and mutually agreed upon: by patient  Dewaine Conger 11/28/2021, 2:22 PM

## 2021-11-28 NOTE — Evaluation (Signed)
Occupational Therapy Assessment and Plan  Patient Details  Name: TERRE HANNEMAN MRN: [id number] Date of Birth: Jun 10, 1971  OT Diagnosis: abnormal posture, acute pain, flaccid hemiplegia and hemiparesis, muscle weakness (generalized), pain in joint, and swelling of limb Rehab Potential: Rehab Potential (ACUTE ONLY): Fair ELOS: 2-2.5 weeks   Today's Date: 11/28/2021 OT Individual Time: 0702-0800 OT Individual Time Calculation (min): 58 min     Hospital Problem: Principal Problem:   CAP (community acquired pneumonia) Active Problems:   Debility   Past Medical History:  Past Medical History:  Diagnosis Date   Arthralgia of temporomandibular joint    CAD, multiple vessel    a. 06/2016 Cath: ostLM 40%, ostLAD 40%, pLAD 95%, ost-pLCx 60%, pLCx 95%, mLCx 60%, mRCA 95%, D2 50%, LVSF nl;  b. 07/2016 CABG x 4 (LIMA->LAD, VG->Diag, VG->OM, VG->RCA); c. 08/2016 Cath: 3VD w/ 4/4 patent grafts. LAD distal to LIMA has diff dzs->Med rx; d. 08/2020 Cath: 4/4 patent grafts, native 3VD. EF 55-65%-->Med Rx.   Carotid arterial disease ([place])    a. 07/2016 s/p R CEA; b. 02/2021 U/S: RICA 88-28%, LICA 0-03%.   Clotting disorder ([place])    Depression    Diastolic dysfunction    a. 06/2016 Echo: EF 50-55%, mild inf wall HK, GR1DD, mild MR, RV sys fxn nl, mildly dilated LA, PASP nl   Fatty liver disease, nonalcoholic 4917   History of blood transfusion    with heart surgery   HLD (hyperlipidemia)    Labile hypertension    a. prior renal ngiogram negative for RAS in 03/2016; b. catecholamines and metanephrines normal, mildly elevated renin with normal aldosterone and normal ratio in 02/2016   Myocardial infarction [facility]) 2017   Obesity    PAD (peripheral artery disease) ([place])    a. 09/2018 s/p L SFA stenting; b. 07/2019 Periph Angio: Patent m/d L SFA stent w/ 100% L SFA distal to stent. L AT 100d, L Peroneal diff dzs-->Med Rx; c. 02/2021 ABIs: stable @ 0.61 on R and 0.46 on L.   PTSD (post-traumatic stress  disorder)    Tobacco abuse    Type 2 diabetes mellitus ([place]) 12/2015   Past Surgical History:  Past Surgical History:  Procedure Laterality Date   ABDOMINAL AORTOGRAM W/LOWER EXTREMITY [name] 10/15/2018   Procedure: ABDOMINAL AORTOGRAM W/LOWER EXTREMITY;  Surgeon: Wellington Hampshire, MD;  Location: [place] [facility];  Service: Cardiovascular;  Laterality: [name];   ABDOMINAL AORTOGRAM W/LOWER EXTREMITY Bilateral 08/19/2019   Procedure: ABDOMINAL AORTOGRAM W/LOWER EXTREMITY;  Surgeon: Wellington Hampshire, MD;  Location: [place] [facility];  Service: Cardiovascular;  Laterality: Bilateral;   CARDIAC CATHETERIZATION [name] 06/29/2016   Procedure: Left Heart Cath and Coronary Angiography;  Surgeon: Minna Merritts, MD;  Location: [place] [facility];  Service: Cardiovascular;  Laterality: [name];   CARDIAC CATHETERIZATION [name] 08/29/2016   Procedure: Left Heart Cath and Cors/Grafts Angiography;  Surgeon: Wellington Hampshire, MD;  Location: [place] [facility];  Service: Cardiovascular;  Laterality: [name];   CESAREAN SECTION     CHOLECYSTECTOMY     CORONARY ARTERY BYPASS GRAFT [name] 07/06/2016   Procedure: CORONARY ARTERY BYPASS GRAFTING (CABG) x four, using left internal mammary artery and right leg greater saphenous vein harvested endoscopically;  Surgeon: Ivin Poot, MD;  Location: [place];  Service: Open Heart Surgery;  Laterality: [name];   ENDARTERECTOMY Right 07/06/2016   Procedure: ENDARTERECTOMY CAROTID;  Surgeon: Rosetta Posner, MD;  Location: [place];  Service: Vascular;  Laterality: Right;  ENDARTERECTOMY Right 04/27/2020   Procedure: REDO OF RIGHT ENDARTERECTOMY CAROTID;  Surgeon: Rosetta Posner, MD;  Location: [place];  Service: Vascular;  Laterality: Right;   LEFT HEART CATH AND CORS/GRAFTS ANGIOGRAPHY [name] 08/24/2020   Procedure: LEFT HEART CATH AND CORS/GRAFTS ANGIOGRAPHY;  Surgeon: Wellington Hampshire, MD;  Location: [place] [facility];  Service: Cardiovascular;  Laterality: [name];   PERIPHERAL VASCULAR CATHETERIZATION  [name] 04/18/2016   Procedure: Renal Angiography;  Surgeon: Wellington Hampshire, MD;  Location: [place] [facility];  Service: Cardiovascular;  Laterality: [name];   PERIPHERAL VASCULAR INTERVENTION Left 10/15/2018   Procedure: PERIPHERAL VASCULAR INTERVENTION;  Surgeon: Wellington Hampshire, MD;  Location: [place] [facility];  Service: Cardiovascular;  Laterality: Left;  Left superficial femoral   TEE WITHOUT CARDIOVERSION [name] 07/06/2016   Procedure: TRANSESOPHAGEAL ECHOCARDIOGRAM (TEE);  Surgeon: Ivin Poot, MD;  Location: [place];  Service: Open Heart Surgery;  Laterality: [name];   TONSILLECTOMY      Assessment & Plan Clinical Impression: Renette Butters. Sleeper is a 50 year old right-handed female with history of right pontine infarction maintained on Plavix and aspirin receiving inpatient rehab services 06/28/2021 - 08/01/2021, CAD status post CABG followed by cardiology services Dr. Jacqulyn Cane, multiple UTIs, right CEA x2, diabetes mellitus, fatty liver, chronic pain, PTSD maintained on Lexapro as well as Cogentin/Lamictal and Klonopin, diastolic congestive heart failure, hyperlipidemia, obesity with BMI 34.15, history of tobacco use.  Per chart review patient lives with spouse and daughter who is special needs.  1 level home ramped entrance.  Patient needed moderate assist for mobility using hemiwalker/minimal assist for ADLs as well as reported multiple falls in the past month.  Presented to [facility] 11/21/2021 with worsening cough x1 week.  Denied any fever or chills.  She had had a mechanical fall day prior to admission with left foot pain.  Husband had reported mental status changes.  In the ED blood pressure 143/83 and later 92/56.  Pulse O2 briefly dropped to 85% on room air rebounded to 96% on 3 L nasal cannula.  COVID-19 PCR negative.  UA showed 6-10 WBCs with rare bacteria.  EKG normal sinus rhythm.  Chest x-ray showed patchy opacities of the left greater than right lung base suspicious for pneumonia.  Left  ankle film showed age-indeterminate avulsion fracture from the tip of the medial malleolus.  Left knee films negative.  MRI of the brain no acute intracranial abnormality.  Old right occipital and parietal cortical infarcts.  Admission chemistries lactic acid 2.1, blood cultures no growth to date, urine culture 30,000 multiple species, WBC 19,800,, procalcitonin level 19.24 with follow-up 6.74, sodium 134, glucose 128.  Patient was placed on broad-spectrum intravenous antibiotics transitioned to Levaquin and completed course.  She was weaned from oxygen therapy.  Orthopedic services Dr.Poggi follow-up for avulsion fracture medial malleolus left foot conservative care Aircast applied weightbearing as tolerated.  Subcutaneous Lovenox for DVT prophylaxis.  Follow-up for psychiatry services for history of major depressive disorder/PTSD and patient remains on Cogentin/Klonopin/Lamictal.  Tolerating a regular consistency diet.  Therapy evaluations completed due to patient decreased functional mobility was admitted for a comprehensive rehab program. Patient transferred to CIR on 11/27/2021 .    Patient currently requires max with basic self-care skills secondary to muscle weakness and muscle paralysis and impaired timing and sequencing, abnormal tone, unbalanced muscle activation, decreased coordination, and decreased motor planning.  Prior to hospitalization, patient could complete UB ADLs with Min A and LB ADLs with Min and use of  hemi-walker.   Patient will benefit from skilled intervention to decrease level of assist with basic self-care skills and increase independence with basic self-care skills prior to discharge home with spouse. Anticipate patient will require 24 hour supervision and follow up outpatient.  OT - End of Session Activity Tolerance: Tolerates 30+ min activity with multiple rests Endurance Deficit: Yes Endurance Deficit Description: Requires seated rest breaks OT Assessment Rehab Potential  (ACUTE ONLY): Fair OT Patient demonstrates impairments in the following area(s): Balance;Motor;Pain;Safety OT Basic ADL's Functional Problem(s): Grooming;Bathing;Dressing;Toileting OT Transfers Functional Problem(s): Toilet;Tub/Shower OT Additional Impairment(s): Fuctional Use of Upper Extremity OT Plan OT Intensity: Minimum of 1-2 x/day, 45 to 90 minutes OT Frequency: 5 out of 7 days OT Duration/Estimated Length of Stay: 2-2.5 weeks OT Treatment/Interventions: Balance/vestibular training;Community reintegration;Discharge planning;DME/adaptive equipment instruction;Functional electrical stimulation;Functional mobility training;Neuromuscular re-education;Pain management;Patient/family education;Self Care/advanced ADL retraining;Therapeutic Activities;Therapeutic Exercise;UE/LE Strength taining/ROM;UE/LE Coordination activities;Wheelchair propulsion/positioning OT Self Feeding Anticipated Outcome(s): [name] OT Basic Self-Care Anticipated Outcome(s): Min A OT Toileting Anticipated Outcome(s): Min A OT Bathroom Transfers Anticipated Outcome(s): Min A OT Recommendation Patient destination: Home Follow Up Recommendations: Outpatient OT Equipment Recommended: To be determined   OT Evaluation Precautions/Restrictions  Precautions Precautions: Fall Precaution Comments: Hx recurrent falls Restrictions Weight Bearing Restrictions: Yes LLE Weight Bearing: Weight bearing as tolerated Other Position/Activity Restrictions: WBAT LLE with air cast or CAM boot? General Chart Reviewed: Yes Family/Caregiver Present: No Vital Signs Therapy Vitals Temp: 98 F (36.7 C) Pulse Rate: 86 Resp: 18 BP: (!) 157/94 Patient Position (if appropriate): Lying Oxygen Therapy SpO2: 93 % O2 Device: Room Air Pain Pain Assessment Pain Scale: 0-10 Pain Score: 5  Pain Type: Chronic pain Pain Location: Hand (3rd and 4th digits) Pain Orientation: Left Pain Descriptors / Indicators: Aching;Sore Pain Onset:  On-going Home Living/Prior Functioning Home Living Family/patient expects to be discharged to:: Private residence Living Arrangements: Spouse/significant other, Children Available Help at Discharge: Family, Available PRN/intermittently Type of Home: House Home Access: Ramped entrance Home Layout: One level Bathroom Shower/Tub: Government social research officer Accessibility: Yes Additional Comments: Not accessible via wc  Lives With: Spouse, Daughter Prior Function Level of Independence: Needs assistance with ADLs, Needs assistance with homemaking Bath: Minimal Toileting: Minimal Dressing: Minimal  Able to Take Stairs?: No Driving: No Vision Baseline Vision/History: 1 Wears glasses (Readers) Patient Visual Report: Blurring of vision (Reports needing new glasses) Vision Assessment?: No apparent visual deficits Perception  Perception: Within Functional Limits Praxis Praxis: Intact Cognition Overall Cognitive Status: History of cognitive impairments - at baseline Arousal/Alertness: Awake/alert Orientation Level: Person;Place;Situation Person: Oriented Place: Oriented Situation: Oriented Year: 2022 Month: December Day of Week: Correct Memory: Appears intact Immediate Memory Recall: Sock;Blue;Bed Memory Recall Sock: Without Cue Memory Recall Blue: Without Cue Memory Recall Bed: With Cue Attention: Focused Focused Attention: Appears intact Awareness: Appears intact Problem Solving: Appears intact Safety/Judgment: Appears intact Sensation Sensation Light Touch: Appears Intact Hot/Cold: Appears Intact Coordination Gross Motor Movements are Fluid and Coordinated: No Coordination and Movement Description: WFL on R; grossly impaired on L 2/2 residual L hemiplegia Finger Nose Finger Test: Intact on R; uanble to assess on L 2/2 residual L hemiplegia Motor  Motor Motor: Hemiplegia Motor - Skilled Clinical Observations: L hemi at baseline  Trunk/Postural  Assessment  Cervical Assessment Cervical Assessment: Within Functional Limits Thoracic Assessment Thoracic Assessment: Exceptions to [facility] (Rounded shoulders) Lumbar Assessment Lumbar Assessment: Exceptions to [facility] (Posterior pelvic tilt) Postural Control Postural Control: Deficits on evaluation Righting Reactions: Delayed; insufficient Protective Responses: Delayed; insufficient  Balance Balance  Balance Assessed: Yes Static Sitting Balance Static Sitting - Balance Support: Right upper extremity supported Static Sitting - Level of Assistance: 5: Stand by assistance Dynamic Sitting Balance Dynamic Sitting - Balance Support: Right upper extremity supported Dynamic Sitting - Level of Assistance: 5: Stand by assistance Dynamic Sitting - Balance Activities: Lateral lean/weight shifting;Forward lean/weight shifting;Reaching across midline;Other (comment) Sitting balance - Comments: During seated ADLs. Static Standing Balance Static Standing - Balance Support: Right upper extremity supported;During functional activity Static Standing - Level of Assistance: 4: Min assist Dynamic Standing Balance Dynamic Standing - Balance Support: Right upper extremity supported Dynamic Standing - Level of Assistance: 3: Mod assist Extremity/Trunk Assessment RUE Assessment RUE Assessment: Within Functional Limits LUE Assessment LUE Assessment: Exceptions to [facility] Passive Range of Motion (PROM) Comments: [facility] General Strength Comments: L shoulder 1/5, elbow/wrist/hand 0/5. Unable to use LUE as a stabilzer for self care. LUE Body System: Neuro Brunstrum levels for arm and hand: Arm;Hand Brunstrum level for arm: Stage I Presynergy Brunstrum level for hand: Stage I Flaccidity  Care Tool Care Tool Self Care Eating   Eating Assist Level: Set up assist    Oral Care  Oral care, brush teeth, clean dentures activity did not occur: Refused      Bathing Bathing activity did not occur: Refused             Upper Body Dressing(including orthotics) Upper body dressing/undressing activity did not occur (including orthotics): Refused          Lower Body Dressing (excluding footwear) Lower body dressing activity did not occur: Refused        Putting on/Taking off footwear   What is the patient wearing?: Socks;Shoes;AFO Assist for footwear: Maximal Assistance - Patient 25 - 49%       Care Tool Toileting Toileting activity   Assist for toileting: Dependent - Patient 0% Charlaine Dalton)     Care Tool Bed Mobility Roll left and right activity   Roll left and right assist level: Moderate Assistance - Patient 50 - 74% (Min A to R; Mod A to L)    Sit to lying activity   Sit to lying assist level: Minimal Assistance - Patient > 75%    Lying to sitting on side of bed activity   Lying to sitting on side of bed assist level: the ability to move from lying on the back to sitting on the side of the bed with no back support.: Moderate Assistance - Patient 50 - 74%     Care Tool Transfers Sit to stand transfer   Sit to stand assist level: Moderate Assistance - Patient 50 - 74%    Chair/bed transfer         Toilet transfer   Assist Level: Dependent - Patient 0% Charlaine Dalton)     Care Tool Cognition  Expression of Ideas and Wants Expression of Ideas and Wants: 3. Some difficulty - exhibits some difficulty with expressing needs and ideas (e.g, some words or finishing thoughts) or speech is not clear  Understanding Verbal and Non-Verbal Content Understanding Verbal and Non-Verbal Content: 3. Usually understands - understands most conversations, but misses some part/intent of message. Requires cues at times to understand   Memory/Recall Ability Memory/Recall Ability : Current season;That he or she is in a hospital/hospital unit   Refer to Care Plan for [place] [facility]    Recommendations for other services: Other: TBD    Skilled Therapeutic Intervention Patient met lying supine  in  bed in agreement with OT treatment session. 0/10 pain reported at rest and mild pain in L lateral ankle with activity. Education provided on expectations of CIR, role of OT, ELOS and goals. Patient expressed verbal understanding. Spouse not present at time of OT evaluation. Patient able to roll to R in supine with Min A. Assist required to elevate trunk. Patient declined bathing this a.m. Assist required to don footwear, remove L resting hand splint and don wrist cock up splint. Please refer below for additional details. Mild edema noted at LUE. Small amount of time spent performing retrograde massage. Patient able to stand from elevated EOB to hemi-walker with Mod A but unable to take steps forward. Stedy used for transfer to commode in bathroom. Hand hygiene completed in perched position on stedy. Return to supine with assist to advance LLE from EOB to bed surface 2/2 L hemiplegia. Session concluded with patient lying supine in bed with call bell within reach, bed alarm activated and all needs met.   ADL ADL Eating: Set up Where Assessed-Eating: Bed level Grooming: Setup Where Assessed-Grooming: Sitting at sink Upper Body Bathing: Not assessed Lower Body Bathing: Not assessed Upper Body Dressing: Not assessed Lower Body Dressing: Not assessed Toileting: Moderate assistance Where Assessed-Toileting: Glass blower/designer: Dependent Armed forces technical officer Method: Other (comment) Customer service manager) Science writer: Radiographer, therapeutic: Not assessed Mobility  Bed Mobility Bed Mobility: Rolling Right;Rolling Left;Right Sidelying to Sit;Sitting - Scoot to [employer] of Bed;Sit to Supine Rolling Right: Moderate Assistance - Patient 50-74% Rolling Left: Minimal Assistance - Patient > 75% Right Sidelying to Sit: Moderate Assistance - Patient 50-74% Sitting - Scoot to Edge of Bed: Moderate Assistance - Patient 50-74% Sit to Supine: Minimal Assistance - Patient > 75% Transfers Sit to Stand:  Moderate Assistance - Patient 50-74% Stand to Sit: Minimal Assistance - Patient > 75%   Discharge Criteria: Patient will be discharged from OT if patient refuses treatment 3 consecutive times without medical reason, if treatment goals not met, if there is a change in medical status, if patient makes no progress towards goals or if patient is discharged from hospital.  The above assessment, treatment plan, treatment alternatives and goals were discussed and mutually agreed upon: by patient  Sapna Padron R [place] 11/28/2021, 8:22 AM

## 2021-11-28 NOTE — Progress Notes (Signed)
Inpatient Rehabilitation  Patient information reviewed and entered into eRehab system by Jacole Capley M. Adilynn Bessey, M.A., CCC/SLP, PPS Coordinator.  Information including medical coding, functional ability and quality indicators will be reviewed and updated through discharge.    

## 2021-11-28 NOTE — Evaluation (Signed)
Physical Therapy Assessment and Plan  Patient Details  Name: Erika [name] MRN: [id number] Date of Birth: 09-Jul-1971  PT Diagnosis: Abnormal posture, Abnormality of gait, Coordination disorder, Difficulty walking, Hemiplegia non-dominant, Muscle weakness, and Pain in L ankle Rehab Potential: Good ELOS: 12-14 days   Today's Date: 11/28/2021 PT Individual Time: 3254-1736 PT Individual Time Calculation (min): 72 min    Hospital Problem: Principal Problem:   CAP (community acquired pneumonia) Active Problems:   Debility   Past Medical History:  Past Medical History:  Diagnosis Date   Arthralgia of temporomandibular joint    CAD, multiple vessel    a. 06/2016 Cath: ostLM 40%, ostLAD 40%, pLAD 95%, ost-pLCx 60%, pLCx 95%, mLCx 60%, mRCA 95%, D2 50%, LVSF nl;  b. 07/2016 CABG x 4 (LIMA->LAD, VG->Diag, VG->OM, VG->RCA); c. 08/2016 Cath: 3VD w/ 4/4 patent grafts. LAD distal to LIMA has diff dzs->Med rx; d. 08/2020 Cath: 4/4 patent grafts, native 3VD. EF 55-65%-->Med Rx.   Carotid arterial disease (HCC)    a. 07/2016 s/p R CEA; b. 02/2021 U/S: RICA 40-59%, LICA 1-39%.   Clotting disorder (HCC)    Depression    Diastolic dysfunction    a. 06/2016 Echo: EF 50-55%, mild inf wall HK, GR1DD, mild MR, RV sys fxn nl, mildly dilated LA, PASP nl   Fatty liver disease, nonalcoholic 2016   History of blood transfusion    with heart surgery   HLD (hyperlipidemia)    Labile hypertension    a. prior renal ngiogram negative for RAS in 03/2016; b. catecholamines and metanephrines normal, mildly elevated renin with normal aldosterone and normal ratio in 02/2016   Myocardial infarction [facility]) 2017   Obesity    PAD (peripheral artery disease) (HCC)    a. 09/2018 s/p L SFA stenting; b. 07/2019 Periph Angio: Patent m/d L SFA stent w/ 100% L SFA distal to stent. L AT 100d, L Peroneal diff dzs-->Med Rx; c. 02/2021 ABIs: stable @ 0.61 on R and 0.46 on L.   PTSD (post-traumatic stress disorder)    Tobacco abuse     Type 2 diabetes mellitus (HCC) 12/2015   Past Surgical History:  Past Surgical History:  Procedure Laterality Date   ABDOMINAL AORTOGRAM W/LOWER EXTREMITY [name] 10/15/2018   Procedure: ABDOMINAL AORTOGRAM W/LOWER EXTREMITY;  Surgeon: Iran Ouch, MD;  Location: [facility];  Service: Cardiovascular;  Laterality: [name];   ABDOMINAL AORTOGRAM W/LOWER EXTREMITY Bilateral 08/19/2019   Procedure: ABDOMINAL AORTOGRAM W/LOWER EXTREMITY;  Surgeon: Iran Ouch, MD;  Location: [facility];  Service: Cardiovascular;  Laterality: Bilateral;   CARDIAC CATHETERIZATION [name] 06/29/2016   Procedure: Left Heart Cath and Coronary Angiography;  Surgeon: Antonieta Iba, MD;  Location: [facility] INVASIVE CV LAB;  Service: Cardiovascular;  Laterality: [name];   CARDIAC CATHETERIZATION [name] 08/29/2016   Procedure: Left Heart Cath and Cors/Grafts Angiography;  Surgeon: Iran Ouch, MD;  Location: [facility];  Service: Cardiovascular;  Laterality: [name];   CESAREAN SECTION     CHOLECYSTECTOMY     CORONARY ARTERY BYPASS GRAFT [name] 07/06/2016   Procedure: CORONARY ARTERY BYPASS GRAFTING (CABG) x four, using left internal mammary artery and right leg greater saphenous vein harvested endoscopically;  Surgeon: Kerin Perna, MD;  Location: [facility] [place] OR;  Service: Open Heart Surgery;  Laterality: [name];   ENDARTERECTOMY Right 07/06/2016   Procedure: ENDARTERECTOMY CAROTID;  Surgeon: Larina Earthly, MD;  Location: [facility] OR;  Service: Vascular;  Laterality: Right;   ENDARTERECTOMY Right 04/27/2020  Procedure: REDO OF RIGHT ENDARTERECTOMY CAROTID;  Surgeon: Rosetta Posner, MD;  Location: [facility] OR;  Service: Vascular;  Laterality: Right;   LEFT HEART CATH AND CORS/GRAFTS ANGIOGRAPHY [name] 08/24/2020   Procedure: LEFT HEART CATH AND CORS/GRAFTS ANGIOGRAPHY;  Surgeon: Wellington Hampshire, MD;  Location: [place] CV LAB;  Service: Cardiovascular;  Laterality: [name];   PERIPHERAL VASCULAR CATHETERIZATION [name] 04/18/2016   Procedure: Renal  Angiography;  Surgeon: Wellington Hampshire, MD;  Location: [place] CV LAB;  Service: Cardiovascular;  Laterality: [name];   PERIPHERAL VASCULAR INTERVENTION Left 10/15/2018   Procedure: PERIPHERAL VASCULAR INTERVENTION;  Surgeon: Wellington Hampshire, MD;  Location: [place] CV LAB;  Service: Cardiovascular;  Laterality: Left;  Left superficial femoral   TEE WITHOUT CARDIOVERSION [name] 07/06/2016   Procedure: TRANSESOPHAGEAL ECHOCARDIOGRAM (TEE);  Surgeon: Ivin Poot, MD;  Location: [place];  Service: Open Heart Surgery;  Laterality: [name];   TONSILLECTOMY      Assessment & Plan Clinical Impression: Patient is a 50 y.o. year old female with with history of right pontine infarction maintained on Plavix and aspirin receiving inpatient rehab services 06/28/2021 - 08/01/2021, CAD status post CABG followed by cardiology services Dr. Jacqulyn Cane, multiple UTIs, right CEA x2, diabetes mellitus, fatty liver, chronic pain, PTSD maintained on Lexapro as well as Cogentin/Lamictal and Klonopin, diastolic congestive heart failure, hyperlipidemia, obesity with BMI 34.15, history of tobacco use.  Per chart review patient lives with spouse and daughter who is special needs.  1 level home ramped entrance.  Patient needed moderate assist for mobility using hemiwalker/minimal assist for ADLs as well as reported multiple falls in the past month.  Presented to [facility] 11/21/2021 with worsening cough x1 week.  Denied any fever or chills.  She had had a mechanical fall day prior to admission with left foot pain.  Husband had reported mental status changes.  In the ED blood pressure 143/83 and later 92/56.  Pulse O2 briefly dropped to 85% on room air rebounded to 96% on 3 L nasal cannula.  COVID-19 PCR negative.  UA showed 6-10 WBCs with rare bacteria.  EKG normal sinus rhythm.  Chest x-ray showed patchy opacities of the left greater than right lung base suspicious for pneumonia.  Left ankle film showed age-indeterminate avulsion fracture  from the tip of the medial malleolus.  Left knee films negative.  MRI of the brain no acute intracranial abnormality.  Old right occipital and parietal cortical infarcts.  Admission chemistries lactic acid 2.1, blood cultures no growth to date, urine culture 30,000 multiple species, WBC 19,800,, procalcitonin level 19.24 with follow-up 6.74, sodium 134, glucose 128.  Patient was placed on broad-spectrum intravenous antibiotics transitioned to Levaquin and completed course.  She was weaned from oxygen therapy.  Orthopedic services Dr.Poggi follow-up for avulsion fracture medial malleolus left foot conservative care Aircast applied weightbearing as tolerated.  Subcutaneous Lovenox for DVT prophylaxis.  Follow-up for psychiatry services for history of major depressive disorder/PTSD and patient remains on Cogentin/Klonopin/Lamictal.  Tolerating a regular consistency diet.  Therapy evaluations completed due to patient decreased functional mobility was admitted for a comprehensive rehab program.  Patient currently requires max with mobility secondary to muscle weakness, decreased cardiorespiratoy endurance, unbalanced muscle activation and decreased coordination, and decreased standing balance, decreased postural control, hemiplegia, and decreased balance strategies.  Prior to hospitalization, patient was min with mobility and lived with Spouse, Daughter in a House home.  Home access is  Ramped entrance.  Patient will benefit from skilled PT intervention  to maximize safe functional mobility, minimize fall risk, and decrease caregiver burden for planned discharge  initially at home w/prn assistance and then to SNF .  Anticipate patient will  not require PT  at discharge due to her plan to DC to SNF.  PT - End of Session Activity Tolerance: Tolerates 30+ min activity with multiple rests Endurance Deficit: Yes Endurance Deficit Description: Requires seated rest breaks PT Assessment Rehab Potential (ACUTE/IP ONLY):  Good PT Barriers to Discharge: Decreased caregiver support;Insurance for SNF coverage;Behavior;Other (comments) PT Barriers to Discharge Comments: Impairments from previous CVA PT Patient demonstrates impairments in the following area(s): Balance;Behavior;Endurance;Motor;Pain;Safety PT Transfers Functional Problem(s): Bed Mobility;Bed to Chair;Car;Furniture PT Locomotion Functional Problem(s): Ambulation;Wheelchair Mobility PT Plan PT Intensity: Minimum of 1-2 x/day ,45 to 90 minutes PT Frequency: 5 out of 7 days PT Duration Estimated Length of Stay: 12-14 days PT Treatment/Interventions: Ambulation/gait training;Community reintegration;DME/adaptive equipment instruction;Neuromuscular re-education;Psychosocial support;UE/LE Strength taining/ROM;Wheelchair propulsion/positioning;Balance/vestibular training;Discharge planning;Functional electrical stimulation;Pain management;Skin care/wound management;Therapeutic Activities;UE/LE Coordination activities;Visual/perceptual remediation/compensation;Therapeutic Exercise;Patient/family education;Splinting/orthotics;Functional mobility training;Disease management/prevention;Cognitive remediation/compensation PT Transfers Anticipated Outcome(s): Min A PT Locomotion Anticipated Outcome(s): Min A PT Recommendation Recommendations for Other Services: Neuropsych consult Follow Up Recommendations: Skilled nursing facility Patient destination: Home Equipment Recommended: To be determined   PT Evaluation Precautions/Restrictions Precautions Precautions: Fall Precaution Comments: Hx recurrent falls Required Braces or Orthoses: Other Brace Other Brace: CAM boot on LLE Restrictions Weight Bearing Restrictions: Yes LLE Weight Bearing: Weight bearing as tolerated Pain Interference Pain Interference Pain Effect on Sleep: 2. Occasionally Pain Interference with Therapy Activities: 3. Frequently Pain Interference with Day-to-Day Activities: 3.  Frequently Home Living/Prior Functioning Home Living Living Arrangements: Spouse/significant other;Children Available Help at Discharge: Family;Available PRN/intermittently Type of Home: House Home Access: Ramped entrance Home Layout: One level Bathroom Shower/Tub: Chiropodist: Standard Bathroom Accessibility: Yes Additional Comments: Not accessible via wc  Lives With: Spouse;Daughter Prior Function Level of Independence: Needs assistance with ADLs;Needs assistance with homemaking  Able to Take Stairs?: No Driving: No Vision/Perception  Vision - History Ability to See in Adequate Light: 0 Adequate Perception Perception: Within Functional Limits Praxis Praxis: Intact  Cognition Overall Cognitive Status: History of cognitive impairments - at baseline Arousal/Alertness: Awake/alert Orientation Level: Oriented X4 Year: 2022 Month: December Day of Week: Correct Attention: Focused;Selective Focused Attention: Appears intact Selective Attention: Impaired Selective Attention Impairment: Verbal complex;Functional complex Memory: Impaired Memory Impairment: Retrieval deficit Awareness: Appears intact Problem Solving: Appears intact Safety/Judgment: Appears intact Sensation Sensation Light Touch: Appears Intact Coordination Gross Motor Movements are Fluid and Coordinated: No Coordination and Movement Description: WFL on R; grossly impaired on L 2/2 residual L hemiplegia and L ankle fx Finger Nose Finger Test: Intact on R; uanble to assess on L 2/2 residual L hemiplegia Heel Shin Test: NT Motor  Motor Motor: Hemiplegia Motor - Skilled Clinical Observations: L hemi at baseline   Trunk/Postural Assessment  Cervical Assessment Cervical Assessment: Within Functional Limits Thoracic Assessment Thoracic Assessment: Exceptions to [facility] (Kyphotic) Lumbar Assessment Lumbar Assessment: Exceptions to [facility] (Posterior pelvic tilt) Postural Control Postural  Control: Deficits on evaluation Trunk Control: Lean to R Righting Reactions: Delayed; insufficient Protective Responses: Delayed; insufficient  Balance Balance Balance Assessed: Yes Static Sitting Balance Static Sitting - Balance Support: Right upper extremity supported;Feet supported Static Sitting - Level of Assistance: 5: Stand by assistance Dynamic Sitting Balance Dynamic Sitting - Balance Support: Right upper extremity supported;Feet supported Dynamic Sitting - Level of Assistance: 4: Min assist Dynamic Sitting - Balance Activities: Lateral lean/weight shifting;Forward lean/weight shifting;Reaching across midline  Sitting balance - Comments: During seated ADLs. Static Standing Balance Static Standing - Balance Support: During functional activity;Bilateral upper extremity supported Static Standing - Level of Assistance: 3: Mod assist Dynamic Standing Balance Dynamic Standing - Balance Support: During functional activity;Bilateral upper extremity supported Dynamic Standing - Level of Assistance: 2: Max assist Dynamic Standing - Balance Activities: Forward lean/weight shifting;Reaching for objects Extremity Assessment  RLE Assessment RLE Assessment: Exceptions to [facility] RLE Strength RLE Overall Strength: Within Functional Limits for tasks assessed Right Hip Flexion: 4/5 Right Hip Extension: 4/5 Right Hip ABduction: 4/5 Right Hip ADduction: 4/5 Right Knee Flexion: 4/5 Right Knee Extension: 4/5 Right Ankle Dorsiflexion: 4/5 Right Ankle Plantar Flexion: 4/5 LLE Assessment LLE Assessment: Exceptions to WFL LLE Strength LLE Overall Strength: Deficits;Due to pain;Due to premorbid status Left Hip Flexion: 2-/5 Left Hip Extension: 2-/5 Left Hip ABduction: 1/5 Left Hip ADduction: 2-/5 Left Knee Flexion: 2-/5 Left Knee Extension: 2-/5 Left Ankle Dorsiflexion: 0/5 Left Ankle Plantar Flexion: 0/5  Care Tool Care Tool Bed Mobility Roll left and right activity   Roll left and right  assist level: Moderate Assistance - Patient 50 - 74% (Min A to R, mod A to L w/bedrail)    Sit to lying activity   Sit to lying assist level: Moderate Assistance - Patient 50 - 74% (LUE management, trunk support)    Lying to sitting on side of bed activity   Lying to sitting on side of bed assist level: the ability to move from lying on the back to sitting on the side of the bed with no back support.: Minimal Assistance - Patient > 75%     Care Tool Transfers Sit to stand transfer   Sit to stand assist level: Moderate Assistance - Patient 50 - 74%    Chair/bed transfer   Chair/bed transfer assist level: Maximal Assistance - Patient 25 - 49% (RW)     Toilet transfer   Assist Level: Dependent - Patient 0%    Scientist, product/process development transfer activity did not occur: Safety/medical concerns (Significant BLE weakness, fear-avoidance behavior, L hemi and ankle fx)        Care Tool Locomotion Ambulation Ambulation activity did not occur: Safety/medical concerns (Significant BLE weakness, fear-avoidance behavior, L hemi and ankle fx)        Walk 10 feet activity Walk 10 feet activity did not occur: Safety/medical concerns (Significant BLE weakness, fear-avoidance behavior, L hemi and ankle fx)       Walk 50 feet with 2 turns activity Walk 50 feet with 2 turns activity did not occur: Safety/medical concerns (Significant BLE weakness, fear-avoidance behavior, L hemi and ankle fx)      Walk 150 feet activity Walk 150 feet activity did not occur: Safety/medical concerns (Significant BLE weakness, fear-avoidance behavior, L hemi and ankle fx)      Walk 10 feet on uneven surfaces activity Walk 10 feet on uneven surfaces activity did not occur: Safety/medical concerns (Significant BLE weakness, fear-avoidance behavior, L hemi and ankle fx)      Stairs Stair activity did not occur: Safety/medical concerns (Significant BLE weakness, fear-avoidance behavior, L hemi and ankle fx)        Walk  up/down 1 step activity Walk up/down 1 step or curb (drop down) activity did not occur: Safety/medical concerns (Significant BLE weakness, fear-avoidance behavior, L hemi and ankle fx)      Walk up/down 4 steps activity Walk up/down 4 steps activity did not occur: Safety/medical concerns (Significant BLE weakness, fear-avoidance behavior,  L hemi and ankle fx)      Walk up/down 12 steps activity Walk up/down 12 steps activity did not occur: Safety/medical concerns (Significant BLE weakness, fear-avoidance behavior, L hemi and ankle fx)      Pick up small objects from floor Pick up small object from the floor (from standing position) activity did not occur: Safety/medical concerns (Significant BLE weakness, fear-avoidance behavior, L hemi and ankle fx)      Wheelchair Is the patient using a wheelchair?: Yes Type of Wheelchair: Manual   Wheelchair assist level: Dependent - Patient 0%    Wheel 50 feet with 2 turns activity   Assist Level: Dependent - Patient 0%  Wheel 150 feet activity   Assist Level: Dependent - Patient 0%    Refer to Care Plan for Long Term Goals  SHORT TERM GOAL WEEK 1 PT Short Term Goal 1 (Week 1): Pt will perform sit <>stand w/mod A and LRAD PT Short Term Goal 2 (Week 1): Pt will perform bed <>chair transfer w/mod A and LRAD PT Short Term Goal 3 (Week 1): Pt will ambulate 20' w/mod A and LRAD  Recommendations for other services: Neuropsych  Skilled Therapeutic Intervention Evaluation completed (see details above and below) with education on PT POC, CIR safety policies, 3 hour therapy requirement and goals. Pt received supine in bed, very flat affect but agreeable to PT. Pt reported 4/10 pain in L ankle and was premedicated. Offered repositioning and distraction throughout session for pain modulation. Pt performed supine <>sit EOB w/mod A for LUE and trunk support. Pt complaining of aircast on L foot and reported it was causing her pain, noted no CAM boot in room and  notified nursing. Removed shoe from L foot and noted shoe too small due to LLE swelling. Donned gripper sock to L foot for remainder of eval w/total A. Pt performed sit <>stand w/mod A to L PFRW for LUE management and posterolateral lean to R correction. Attempted stand pivot to [facility] w/L PFRW and pt unable to weight shift without B knees bucking, requiring max A to prevent fall. Pt very nervous throughout transfer and often gasped when she was losing her balance. Stand <>sit to [facility] w/mod A for eccentric control. Squat pivot from [facility] to bed on R side w/mod A for LUE support and B knee blocking and pt performed sit <>supine w/min A. Pt was left supine in bed, all needs in reach. Safety plan not updated due to no pen in room, nursing notified of transfer level.   Mobility Bed Mobility Bed Mobility: Rolling Right;Rolling Left;Supine to Sit;Sit to Supine;Sitting - Scoot to Edge of Bed Rolling Right: Moderate Assistance - Patient 50-74% Rolling Left: Minimal Assistance - Patient > 75% Supine to Sit: Moderate Assistance - Patient 50-74% Sitting - Scoot to Edge of Bed: Moderate Assistance - Patient 50-74% Sit to Supine: Minimal Assistance - Patient > 75% Transfers Transfers: Sit to Stand;Stand to [name] Transfers Sit to Stand: Moderate Assistance - Patient 50-74% Stand to Sit: Minimal Assistance - Patient > 75% Stand Pivot Transfers: Maximal Assistance - Patient 25 - 49% Stand Pivot Transfer Details: Tactile cues for weight beaing;Manual facilitation for weight bearing;Tactile cues for weight shifting;Tactile cues for posture;Tactile cues for placement;Manual facilitation for weight shifting;Verbal cues for safe use of DME/AE Transfer (Assistive device): Left platform walker Locomotion  Gait Ambulation: No Gait Gait: No Stairs / Additional Locomotion Stairs: No Wheelchair Mobility Wheelchair Mobility: No   Discharge Criteria: Patient will be discharged from PT  if patient refuses treatment 3  consecutive times without medical reason, if treatment goals not met, if there is a change in medical status, if patient makes no progress towards goals or if patient is discharged from hospital.  The above assessment, treatment plan, treatment alternatives and goals were discussed and mutually agreed upon: by patient  Cruzita Lederer Joshual Terrio, PT, DPT 11/28/2021, 1:41 PM

## 2021-11-28 NOTE — Patient Care Conference (Signed)
Inpatient RehabilitationTeam Conference and Plan of Care Update Date: 11/28/2021   Time: 12:11 PM    Patient Name: Erika [name]      Medical Record Number: [id number]  Date of Birth: 01-24-1971 Sex: Female         Room/Bed: 5C15C/5C15C-01 Payor Info: Payor: MEDICARE / Plan: MEDICARE PART A / Product Type: *No Product type* /    Admit Date/Time:  11/27/2021  1:59 PM  Primary Diagnosis:  CAP (community acquired pneumonia)  Hospital Problems: Principal Problem:   CAP (community acquired pneumonia) Active Problems:   Debility    Expected Discharge Date: Expected Discharge Date: 12/11/21  Team Members Present: Physician leading conference: Dr. [name] Social Worker Present: Ovidio Kin, LCSW Nurse Present: Dorien Chihuahua, RN PT Present: Francena Hanly, PT OT Present: Other (comment) [facility], OT) SLP Present: Weston Anna, SLP PPS Coordinator present : Gunnar Fusi, SLP     Current Status/Progress Goal Weekly Team Focus  Bowel/Bladder     Continent of bowel and bladder; LBM 11/27/21   Maintain continence   Toileting  Swallow/Nutrition/ Hydration             ADL's             Mobility   Mod-max sit <>stand, Max A stand pivot  Min A  Transfers, L NMR, gait training, pt/fam edu, global strengthening   Communication   Eval Pending         Safety/Cognition/ Behavioral Observations  Eval Pending         Pain     Generalized discomfort and pain in left ankle esp. when wearing aircast.   PRN medications for pain   Monitor need for and effectiveness of medications  Skin     [name]          Discharge Planning:   Home with spouse (works) and disabled daughter vs SNF ([facility] [place])  Team Discussion: Patient with deconditioning, and left ankle fracture. Hx of depression and PTSD. Appears to demonstrating fear avoidence behaviors. Discussion of continue air-cast or change to CAM boot for ankle fracture.  Patient on target to meet rehab  goals: yes, currently mod - max assist overall. Requires total assist for transfers due to left ankle pain and right knee buckling. Patient has a history of multiple falls and inconsistent transfers PTA. Needs mod - max assist for sit - stand transfers. Goals for discharge set for min assist overall.  *See Care Plan and progress notes for long and short-term goals.   Revisions to Treatment Plan:  Neuromuscular re education SLP evaluation pending   Teaching Needs: Safety, transfers, toileting, medications, secondary risk management, etc.   Current Barriers to Discharge: Decreased caregiver support and Home enviroment access/layout  Possible Resolutions to Barriers: Family education with spouse and daughter Patient has DME from previous admission: Tub bench, W/C, SPC, RW, BSC, etc.     Medical Summary Current Status: debility after pneumonia, left med mal fx with aircast. depression/PTSD  Barriers to Discharge: Medical stability   Possible Resolutions to [employer] Focus: daily assessment of labs and pt data, pain control   Continued Need for Acute Rehabilitation Level of Care: The patient requires daily medical management by a physician with specialized training in physical medicine and rehabilitation for the following reasons: Direction of a multidisciplinary physical rehabilitation program to maximize functional independence : Yes Medical management of patient stability for increased activity during participation in an intensive rehabilitation regime.: Yes Analysis of laboratory values and/or radiology  reports with any subsequent need for medication adjustment and/or medical intervention. : Yes   I attest that I was present, lead the team conference, and concur with the assessment and plan of the team.   Dorien Chihuahua B 11/28/2021, 1:32 PM

## 2021-11-29 DIAGNOSIS — J189 Pneumonia, unspecified organism: Secondary | ICD-10-CM | POA: Diagnosis not present

## 2021-11-29 LAB — GLUCOSE, CAPILLARY
Glucose-Capillary: 84 mg/dL (ref 70–99)
Glucose-Capillary: 98 mg/dL (ref 70–99)
Glucose-Capillary: 106 mg/dL — ABNORMAL HIGH (ref 70–99)

## 2021-11-29 MED ORDER — BACLOFEN 5 MG HALF TABLET
5.0000 mg | ORAL_TABLET | Freq: Every day | ORAL | Status: DC
  Administered 2021-11-29: 5 mg via ORAL
  Filled 2021-11-29: qty 1

## 2021-11-29 NOTE — Progress Notes (Signed)
Physical Therapy Session Note  Patient Details  Name: Erika [name] MRN: [id number] Date of Birth: Jan 26, 1971  Today's Date: 11/29/2021 PT Individual Time: 3007-6226 and 1420-1532 PT Individual Time Calculation (min): 20 min and 72 min  Short Term Goals: Week 1:  PT Short Term Goal 1 (Week 1): Pt will perform sit <>stand w/mod A and LRAD PT Short Term Goal 2 (Week 1): Pt will perform bed <>chair transfer w/mod A and LRAD PT Short Term Goal 3 (Week 1): Pt will ambulate 20' w/mod A and LRAD  Skilled Therapeutic Interventions/Progress Updates: Tx1: Pt presented in bed agreeable to therapy with encouragement. Pt denies pain but states feels "woozy" since taking meds. Pt agreeable to attempt supine therex. PTA had pt attempt quad sets in LLE with PTA noting increased flexor tone with exertion. When PTA asked pt stated that was new event since fall. Pt stating no increased pain with activity but noted to be very lethargic and required max multimodal cues to complete task. Pt then attempted ankle pumps, quad sets, heel slides and SLR with RLE with pt able to perform but with max multimodal cues and PTA encouraging pt to count reps to stay awake. With activity pt noted to begin falling asleep while counting. PTA ultimately terminating session due to pt's fatigue. Pt left in bed sleeping with call bell within reach, bed alarm on, and needs met.   Tx2: Pt presented in bed agreeable to therapy. Pt noted to be significantly more alert then am session. Pt states pain 7/10 but agreeable to take pain meds at end of session. Pt performed supine to sit with  minA and use of bed features. Pt noted to use RLE to bring LLE towards EOB and then using therapist's hand to pull self upright. Per pt that was similar to what she was doing at home with family. Once pt upright pt was able to scoot to EOB with supervision. PTA then donned socks, air cast, and shoes total A for time management. Discussed with pt use of air  cast vs CAM boot. Per ortho orders able to use either. Pt states unable to use AFO and air cast and finds air cast painful. Discussed pro/cons of air cast during session and pt requesting to try CAM boot as would also provide AFO support. Pt aware would be significantly heavier but willing to try. Message sent to MD at end of session as well as notifying MD of increased tone. Pt's husband arrived during session and stayed during remainder of session. Pt then performed stand pivot transfer with modA for Sit to stand and noted bilateral knee buckling while performing pivot. Pt states has increased fear of falling and insecure placing weight on LLE. Pt transported to 4th floor gym for time management and participated in Sit to stand and weight shifting in parallel bars. While performing weight shifting pt encouraged to perform TKE with weight bearing on LLE. Pt initially required facilitation for TKE but improved throughout session. Pt required modA for Sit to stand pushing up from w/c as well as facilitation for increased anterior weight shifting. Pt was able to progress to forward backwards stepping with emphasis of maintaining TKE in stance phase. Pt then progressed to forwards/backwards walking in parallel bars 52ft x 4 with min/modA. Pt then returned to 5th floor then from elevators propelled w/c via hemi technique back to room with supervision for general conditioning. In room pt performed stand pivot transfer with HW to bed with modA for power up  then minA for pivot transfer. PTA doffed socks/shoes while pt EOB and pt performed sit to supine with CGA and increased time pulling up LLE with pant leg. Bed was placed in Trendelenburg and pt was able to scoot to [facility] pushing with RLE. Pt repositioned to comfort and left with bed alarm on, call bell within reach and husband present.       Therapy Documentation Precautions:  Precautions Precautions: Fall Precaution Comments: Hx recurrent falls Required Braces or  Orthoses: Other Brace Other Brace: CAM boot on LLE Restrictions Weight Bearing Restrictions: Yes LLE Weight Bearing: Weight bearing as tolerated Other Position/Activity Restrictions: WBAT LLE with air cast or CAM boot? General: PT Amount of Missed Time (min): 40 Minutes PT Missed Treatment Reason: Patient fatigue Vital Signs:   Pain: Pain Assessment Pain Scale: 0-10 Pain Score: 9  Pain Location: Foot Pain Orientation: Left Pain Intervention(s): Medication (See eMAR) Mobility:   Locomotion :    Trunk/Postural Assessment :    Balance:   Exercises:   Other Treatments:      Therapy/Group: Individual Therapy  Paschal Blanton 11/29/2021, 12:09 PM

## 2021-11-29 NOTE — Progress Notes (Signed)
Orthopedic Tech Progress Note Patient Details:  Erika [name] 03/10/1971 [id number]  Ortho Devices Type of Ortho Device: CAM walker Ortho Device/Splint Location: LLE Ortho Device/Splint Interventions: Ordered   Post Interventions Patient Tolerated: Fair, Well Instructions Provided: Care of device  [name] 11/29/2021, 4:10 PM

## 2021-11-29 NOTE — Progress Notes (Signed)
PROGRESS NOTE   Subjective/Complaints: First day of therapy went well Discussed with patient, SW, and husband applying for [facility] where she has family who works there.  ROS: +headaches, +ankle pain  Objective:   No results found. Recent Labs    11/27/21 1506 11/28/21 0531  WBC 8.9 9.3  HGB 12.8 13.1  HCT 38.9 40.2  PLT 373 378   Recent Labs    11/27/21 1007 11/27/21 1506 11/28/21 0531  NA 135  --  138  K 4.0  --  3.9  CL 98  --  97*  CO2 29  --  32  GLUCOSE 216*  --  88  BUN 16  --  12  CREATININE 0.87 0.97 0.94  CALCIUM 10.1  --  10.2    Intake/Output Summary (Last 24 hours) at 11/29/2021 1914 Last data filed at 11/29/2021 1820 Gross per 24 hour  Intake 480 ml  Output --  Net 480 ml        Physical Exam: Vital Signs Blood pressure 130/78, pulse 71, temperature 98 F (36.7 C), temperature source Oral, resp. rate 16, height 5\' 7"  (1.702 m), weight 98.9 kg, SpO2 94 %. Gen: no distress, normal appearing HEENT: oral mucosa pink and moist, NCAT Cardio: Reg rate Chest: normal effort, normal rate of breathing Abd: soft, non-distended Ext: no edema Psych: pleasant, normal affect Skin: intact Neurologic: Cranial nerves II through XII intact, motor strength is 5/5 in RIght and 3-/5 left deltoid, bicep, tricep, grip, hip flexor, knee extensors, ankle dorsiflexor and plantar flexor Sensory exam normal sensation to light touch and proprioception in bilateral upper and lower extremities Tone- increased Left finger and wrist flexor tone, and left hip adductor tone  Musculoskeletal: Left foot tenderness.    Assessment/Plan: 1. Functional deficits which require 3+ hours per day of interdisciplinary therapy in a comprehensive inpatient rehab setting. Physiatrist is providing close team supervision and 24 hour management of active medical problems listed below. Physiatrist and rehab team continue to assess  barriers to discharge/monitor patient progress toward functional and medical goals  Care Tool:  Bathing  Bathing activity did not occur: Refused           Bathing assist       Upper Body Dressing/Undressing Upper body dressing Upper body dressing/undressing activity did not occur (including orthotics): Refused What is the patient wearing?: Pull over shirt    Upper body assist Assist Level: Moderate Assistance - Patient 50 - 74%    Lower Body Dressing/Undressing Lower body dressing    Lower body dressing activity did not occur: Refused What is the patient wearing?: Pants     Lower body assist Assist for lower body dressing: Maximal Assistance - Patient 25 - 49%     Toileting Toileting    Toileting assist Assist for toileting: Dependent - Patient 0% Erika [name])     Transfers Chair/bed transfer  Transfers assist     Chair/bed transfer assist level: Moderate Assistance - Patient 50 - 74% (stand pivot with HW)     Locomotion Ambulation   Ambulation assist   Ambulation activity did not occur: Safety/medical concerns (Significant BLE weakness, fear-avoidance behavior, L hemi and ankle fx)  Walk 10 feet activity   Assist  Walk 10 feet activity did not occur: Safety/medical concerns (Significant BLE weakness, fear-avoidance behavior, L hemi and ankle fx)        Walk 50 feet activity   Assist Walk 50 feet with 2 turns activity did not occur: Safety/medical concerns (Significant BLE weakness, fear-avoidance behavior, L hemi and ankle fx)         Walk 150 feet activity   Assist Walk 150 feet activity did not occur: Safety/medical concerns (Significant BLE weakness, fear-avoidance behavior, L hemi and ankle fx)         Walk 10 feet on uneven surface  activity   Assist Walk 10 feet on uneven surfaces activity did not occur: Safety/medical concerns (Significant BLE weakness, fear-avoidance behavior, L hemi and ankle fx)          Wheelchair     Assist Is the patient using a wheelchair?: Yes Type of Wheelchair: Manual    Wheelchair assist level: Dependent - Patient 0%      Wheelchair 50 feet with 2 turns activity    Assist        Assist Level: Dependent - Patient 0%   Wheelchair 150 feet activity     Assist      Assist Level: Dependent - Patient 0%   Blood pressure 130/78, pulse 71, temperature 98 F (36.7 C), temperature source Oral, resp. rate 16, height 5\' 7"  (1.702 m), weight 98.9 kg, SpO2 94 %.    Medical Problem List and Plan: 1.  Debility functional deficits secondary to acute hypoxic respiratory failure/community-acquired pneumonia.  Antibiotic therapy completed.  Wean oxygen to room air             -patient may  shower             -ELOS/Goals: 9-12d, sup/minA goals  Continue CIR 2.  Impaired mobility, lateral steps: continue Lovenox             -antiplatelet therapy: Aspirin 81 mg daily, Plavix 75 mg daily 3. Pain Management/chronic pain: continue Lyrica 225 mg twice daily, hydrocodone as needed, Flexeril 10 mg nightly, Advil as needed 4. Mood/PTSD: Continue Lexapro 20 mg daily, Lamictal 200 mg nightly, Thorazine 100 mg nightly, Cogentin 0.5 mg nightly, Klonopin 0.5 mg twice daily             -antipsychotic agents: [name] 5. Neuropsych: This patient is capable of making decisions on her own behalf. 6. Skin/Wound Care: Routine skin checks 7. Fluids/Electrolytes/Nutrition: Routine in and outs with follow-up chemistries 8.  Avulsion fracture medial malleolus left foot status post fall.  [facility] Dr. Roland Rack.  Aircast as needed. CAM walker ordered. Weightbearing as tolerated 9.  History of right pontine infarction.  Patient received CIR July 2022.  Continue aspirin and Plavix 10.  CAD/CABG.  No chest pain or shortness of breath.  Continue Plavix/aspirin 11.  Hypertension.  Coreg 6.25 mg twice daily, Imdur 60 mg twice daily, Aldactone 25 mg daily, Demadex 10  mg daily.  Monitor with increased mobility 12.  Diabetes mellitus.  Hemoglobin A1c 7.0.  Semglee 54 units daily.  Check blood sugars before meals and at bedtime 13.  Diastolic congestive heart failure.  Continue Demadex 10 mg daily.  Monitor for any signs of fluid overload 14.  Hyperlipidemia.  Crestor/Zetia 15.  GERD.  Protonix 16.  Constipation.  MiraLAX twice daily, Senokot S2 tablets twice daily 17.  Spasticity related to CVA , may benefit from Botox as  Outpt   LOS: 2 days A FACE TO FACE EVALUATION WAS PERFORMED  Clide Deutscher Coty Larsh 11/29/2021, 7:14 PM

## 2021-11-29 NOTE — Progress Notes (Signed)
Occupational Therapy Session Note  Patient Details  Name: Erika [name] MRN: [id number] Date of Birth: 1971-02-15  Today's Date: 11/29/2021 OT Individual Time: 0800-0908 OT Individual Time Calculation (min): 68 min    Short Term Goals: Week 1:  OT Short Term Goal 1 (Week 1): Patient will don UB clothing with set-up assist with use of hemi technique. OT Short Term Goal 2 (Week 1): Patient will complete toilet transfer via squat-pivot and use of hemi-walker. OT Short Term Goal 3 (Week 1): Patient will complete 1/3 parts of LB dressing with Mod A.  Skilled Therapeutic Interventions/Progress Updates:  Pt greeted  supine in bed reporting pain in bilateral feet, alerted RN who entered with pain meds.  Session focus on BADL reeducation, functional mobility and increasing overall activity tolerance. Pt completed supine>sit with MOD A needing most assist for trunk elevation. Pt completed dressing from EOB with pt needing MOD A for UB dressing needing assist to thread LUE through sleeve, and MAX A for LB Dressing needing assist to thread pants and pull pants up to waist line in standing d/t impaired balance. Pt completed sit<>stands during session with MODA with HW throughout session. Stand pivot transfer from EOB>w/c with rw with MODA to L side. Transported pt to gym with total A where pt worked on stand pivot transfers with [facility] remainder of session with pt completing x5 stand pivots from w/c<>EOM with MOD A. Pt with increased difficulty transferring to R side. Pt transported back to room with total A where pt completed additional stand pivot from w/c>EOB with MOD A going towards R side, MOD A for sit>supine needing assist to elevate BLEs to EOB.  Pt left  supine in bed with bed alarm activated and all needs within reach.                      Therapy Documentation Precautions:  Precautions Precautions: Fall Precaution Comments: Hx recurrent falls Required Braces or Orthoses: Other Brace Other  Brace: CAM boot on LLE Restrictions Weight Bearing Restrictions: Yes LLE Weight Bearing: Weight bearing as tolerated Other Position/Activity Restrictions: WBAT LLE with air cast or CAM boot?    Pain: Pt reports unrated pain in L ankle, pain meds provided and rest breaks provided as needed.     Therapy/Group: Individual Therapy  Corinne Ports [facility] 11/29/2021, 9:11 AM

## 2021-11-29 NOTE — Progress Notes (Addendum)
Patient ID: Erika [name], female   DOB: 1971-12-13, 50 y.o.   MRN: [id number] Pt and husband want her to go to [facility] and not to be here on [facility]. Informed them can try to get coverage for this but since she came to [facility] it may not happen. They wanted worker to try she has family members who work at [employer]. Have faxed FL2 to [facility] to see if can offer a bed and work on insurance coverage if can offer.  9:49 AM Reached out to Debbie-Admissions at [facility] await return call  2:33 pm Left another message for [facility] to leave another message  3:09 PM Spoke with [place] who will begin insurance auth to see if will cover SNF  3;20 PM Met with pt and husband to inform did talk with Debbie-admissions and she will begin insurance auth to see if can get coverage.

## 2021-11-29 NOTE — NC FL2 (Signed)
[place] LEVEL OF CARE SCREENING TOOL     IDENTIFICATION  Patient Name: Erika [name] Birthdate: [birth year] Sex: female Admission Date (Current Location): 11/27/2021  [facility] and [place] Number:  Engineering geologist and Address:  [facility] [place], [place] [address] [place], [place] [address]      Provider Number: [id number]  Attending Physician Name and Address:  Izora Ribas, MD  Relative Name and Phone Number:  Magin Balbi [phone]    Current Level of Care: Other (Comment) (Rehab) Recommended Level of Care: [place] Prior Approval Number:    Date Approved/Denied:   PASRR Number: [id number]  Discharge Plan: SNF    Current Diagnoses: Patient Active Problem List   Diagnosis Date Noted   Major depressive disorder, recurrent episode, mild ([place]) 11/26/2021   Ankle fracture, left    Closed avulsion fracture of medial malleolus of left tibia    CAP (community acquired pneumonia) 11/21/2021   Cerebrovascular accident (CVA) due to occlusion of vertebral artery ([place]) 09/11/2021   Cognitive change 09/11/2021   Cognitive communication deficit 08/10/2021   Urinary incontinence 08/10/2021   GERD (gastroesophageal reflux disease) 08/03/2021   Chronic post-traumatic stress disorder (PTSD)    Depression    Right pontine stroke ([place]) 06/28/2021   Hemiplegia and hemiparesis following cerebral infarction affecting left non-dominant side ([place]) 06/24/2021   Chronic diastolic CHF (congestive heart failure) ([place]) 06/24/2021   Fall 06/24/2021   Abnormal LFTs 06/24/2021   Olecranon bursitis of left elbow 06/12/2021   Chronic hip pain (Bilateral) 06/09/2021   Chronic use of opiate for therapeutic purpose 05/09/2021   Greater trochanteric bursitis of hip (Left) 03/09/2021   Bursitis of hip (Left) 84/16/6063   Uncomplicated opioid dependence ([place]) 02/20/2021   Acute conjunctivitis of left eye 01/02/2021    Unintentional weight loss 11/21/2020   Broken teeth (Right) 08/02/2020   History of MI (myocardial infarction) (July 2017) 08/02/2020   Neuropathy 06/09/2020   Dental abscess 06/09/2020   Foot drop (Left) 06/09/2020   Carotid stenosis, asymptomatic, right 04/27/2020   Chronic migraine 11/03/2019   Thrombocytosis 09/16/2019   Erythrocytosis 09/16/2019   Hypercalcemia 09/06/2019   Leukocytosis 09/06/2019   Polycythemia 09/06/2019   Chronic hip pain (Left) 08/27/2019   Osteoarthritis of hip (Left) 08/27/2019   Gluteal tendonitis of buttock (Left) 08/27/2019   Radial nerve palsy (Right) 07/15/2019   Neuropathy of radial nerve (Right) 06/30/2019   Abnormal bruising 06/25/2019   History of carotid endarterectomy (Right) 03/04/2019   Pain medication agreement signed 03/04/2019   Atypical facial pain (Right) 01/27/2019   Chronic ear pain (Right) 01/27/2019   Chronic jaw pain (Right) 01/27/2019   Geniculate Neuralgia (Right) 01/27/2019   Vitamin D deficiency 01/19/2019   Neurogenic pain 01/19/2019   Chronic anticoagulation (PLAVIX) 01/19/2019   Chronic pain syndrome 01/07/2019   Long term current use of opiate analgesic 01/07/2019   Long term prescription benzodiazepine use 01/07/2019   Pharmacologic therapy 01/07/2019   Disorder of skeletal system 01/07/2019   Problems influencing health status 01/07/2019   Chronic headaches (1ry area of Pain) (Right) 01/07/2019   Opiate use 09/24/2018   PVD (peripheral vascular disease) ([place]) 06/24/2018   Chronic ankle pain (Bilateral) 12/27/2017   Tendinopathy of gluteus medius (Right) 12/27/2017   Tendinopathy of gluteus medius (Left) 12/27/2017   Bilateral hip pain 12/26/2017   Chronic elbow pain (Left) 10/17/2017   Elevated troponin I level 05/21/2017   Major depressive disorder, recurrent episode, moderate (HCC)  11/19/2016   Insomnia 10/30/2016   Constipation 07/25/2016   S/P CABG x 4 07/06/2016   Bradycardia    CAD (coronary artery  disease)    Carotid stenosis    CAD in native artery 06/29/2016   Elevated troponin 06/28/2016   Essential hypertension, malignant 06/28/2016   Mild tobacco abuse in early remission 06/28/2016   Essential hypertension    Malignant hypertension    Type 2 diabetes mellitus with other specified complication (HCC)    Chest pain with high risk for cardiac etiology 06/27/2016   NSTEMI (non-ST elevated myocardial infarction) ([place]) 06/27/2016   Proteinuria 03/15/2016   Renal artery stenosis ([place]) 03/15/2016   MDD (major depressive disorder) 10/17/2015   Agoraphobia with panic attacks 04/25/2015   HTN (hypertension), malignant 10/20/2013   Debility 04/02/2013   Cluster headache 03/20/2012   HLD (hyperlipidemia) 07/26/2010   ADJUSTMENT DISORDER WITH MIXED FEATURES 07/26/2010    Orientation RESPIRATION BLADDER Height & Weight     Self, Time, Situation, Place  Normal Continent Weight: 218 lb 0.6 oz (98.9 kg) Height:  5\' 7"  (170.2 cm)  BEHAVIORAL SYMPTOMS/MOOD NEUROLOGICAL BOWEL NUTRITION STATUS      Continent Diet (carb modified thin liquids)  AMBULATORY STATUS COMMUNICATION OF NEEDS Skin   Extensive Assist Verbally Normal                       Personal Care Assistance Level of Assistance  Bathing, Dressing Bathing Assistance: Limited assistance   Dressing Assistance: Limited assistance     Functional Limitations Info             SPECIAL CARE FACTORS FREQUENCY  PT (By licensed PT), OT (By licensed OT)     PT Frequency: 5x week OT Frequency: 5x week            Contractures Contractures Info: Not present    Additional Factors Info  Code Status, Allergies, Psychotropic Code Status Info: Full Code Allergies Info: Chantix Psychotropic Info: Klonopin Lexapro         Current Medications (11/29/2021):  This is the current hospital active medication list Current Facility-Administered Medications  Medication Dose Route Frequency Provider Last Rate Last Admin    acetaminophen (TYLENOL) tablet 650 mg  650 mg Oral Q6H PRN Angiulli, Lavon Paganini, PA-C       Or   acetaminophen (TYLENOL) suppository 650 mg  650 mg Rectal Q6H PRN Angiulli, Lavon Paganini, PA-C       albuterol (PROVENTIL) (2.5 MG/3ML) 0.083% nebulizer solution 2.5 mg  2.5 mg Nebulization Q6H PRN Angiulli, Lavon Paganini, PA-C       aspirin chewable tablet 81 mg  81 mg Oral Daily Cathlyn Parsons, PA-C   81 mg at 11/29/21 5009   benztropine (COGENTIN) tablet 0.5 mg  0.5 mg Oral QHS AngiulliLavon Paganini, PA-C   0.5 mg at 11/28/21 2052   carvedilol (COREG) tablet 6.25 mg  6.25 mg Oral BID Cathlyn Parsons, PA-C   6.25 mg at 11/29/21 3818   chlorpheniramine-HYDROcodone (TUSSIONEX) 10-8 MG/5ML suspension 5 mL  5 mL Oral Q12H PRN Angiulli, Lavon Paganini, PA-C       chlorproMAZINE (THORAZINE) tablet 100 mg  100 mg Oral QHS Cathlyn Parsons, PA-C   100 mg at 11/28/21 2052   clonazePAM (KLONOPIN) tablet 0.5 mg  0.5 mg Oral BID Cathlyn Parsons, PA-C   0.5 mg at 11/29/21 2993   clopidogrel (PLAVIX) tablet 75 mg  75 mg Oral Daily Cathlyn Parsons,  PA-C   75 mg at 11/29/21 0811   [START ON 12/22/2021] cyanocobalamin ((VITAMIN B-12)) injection 1,000 mcg  1,000 mcg Subcutaneous Q30 days Angiulli, Lavon Paganini, PA-C       cyclobenzaprine (FLEXERIL) tablet 10 mg  10 mg Oral QHS AngiulliLavon Paganini, PA-C   10 mg at 11/28/21 2100   enoxaparin (LOVENOX) injection 50 mg  50 mg Subcutaneous Q24H Cathlyn Parsons, PA-C   50 mg at 11/28/21 2053   escitalopram (LEXAPRO) tablet 20 mg  20 mg Oral Daily Cathlyn Parsons, PA-C   20 mg at 11/29/21 7939   ezetimibe (ZETIA) tablet 10 mg  10 mg Oral Daily Cathlyn Parsons, PA-C   10 mg at 11/29/21 0300   guaiFENesin (MUCINEX) 12 hr tablet 600 mg  600 mg Oral BID Cathlyn Parsons, PA-C   600 mg at 11/29/21 9233   HYDROcodone-acetaminophen (NORCO/VICODIN) 5-325 MG per tablet 1-2 tablet  1-2 tablet Oral Q6H PRN Cathlyn Parsons, PA-C   2 tablet at 11/29/21 0076   ibuprofen (ADVIL) tablet  400 mg  400 mg Oral Q8H PRN Angiulli, Lavon Paganini, PA-C       icosapent Ethyl (VASCEPA) 1 g capsule 2 g  2 g Oral BID Cathlyn Parsons, PA-C   2 g at 11/29/21 2263   insulin aspart (novoLOG) injection 0-9 Units  0-9 Units Subcutaneous TID AC & HS Cathlyn Parsons, PA-C   1 Units at 11/28/21 2200   insulin glargine-yfgn (SEMGLEE) injection 54 Units  54 Units Subcutaneous Daily Cathlyn Parsons, PA-C   54 Units at 11/29/21 3354   isosorbide mononitrate (IMDUR) 24 hr tablet 60 mg  60 mg Oral BID WC AngiulliLavon Paganini, PA-C   60 mg at 11/29/21 5625   lamoTRIgine (LAMICTAL) tablet 200 mg  200 mg Oral QHS Cathlyn Parsons, PA-C   200 mg at 11/28/21 2100   magnesium hydroxide (MILK OF MAGNESIA) suspension 30 mL  30 mL Oral Daily PRN Angiulli, Lavon Paganini, PA-C       nitroGLYCERIN (NITROSTAT) SL tablet 0.4 mg  0.4 mg Sublingual Q5 min PRN Angiulli, Lavon Paganini, PA-C       ondansetron [facility]) tablet 4 mg  4 mg Oral Q6H PRN Angiulli, Lavon Paganini, PA-C       Or   ondansetron (ZOFRAN) injection 4 mg  4 mg Intravenous Q6H PRN Angiulli, Lavon Paganini, PA-C       pantoprazole (PROTONIX) EC tablet 40 mg  40 mg Oral Daily Cathlyn Parsons, PA-C   40 mg at 11/29/21 6389   polyethylene glycol (MIRALAX / GLYCOLAX) packet 17 g  17 g Oral BID Cathlyn Parsons, PA-C   17 g at 11/28/21 2053   potassium chloride (KLOR-CON) packet 20 mEq  20 mEq Oral Daily Cathlyn Parsons, PA-C   20 mEq at 11/29/21 3734   pregabalin (LYRICA) capsule 225 mg  225 mg Oral BID Cathlyn Parsons, PA-C   225 mg at 11/29/21 2876   rosuvastatin (CRESTOR) tablet 20 mg  20 mg Oral Daily Cathlyn Parsons, PA-C   20 mg at 11/29/21 8115   senna-docusate (Senokot-S) tablet 2 tablet  2 tablet Oral BID Cathlyn Parsons, PA-C   2 tablet at 11/29/21 7262   spironolactone (ALDACTONE) tablet 25 mg  25 mg Oral Daily Cathlyn Parsons, PA-C   25 mg at 11/29/21 0355   torsemide (DEMADEX) tablet 10 mg  10 mg Oral Daily Cathlyn Parsons, PA-C  10 mg at  11/29/21 4360   traZODone (DESYREL) tablet 25 mg  25 mg Oral QHS PRN Angiulli, Lavon Paganini, PA-C       vitamin B-12 (CYANOCOBALAMIN) tablet 1,000 mcg  1,000 mcg Oral Daily Cathlyn Parsons, PA-C   1,000 mcg at 11/29/21 1658     Discharge Medications: Please see discharge summary for a list of discharge medications.  Relevant Imaging Results:  Relevant Lab Results:   Additional Information SSN: [id number]  Noeh Sparacino, Gardiner Rhyme, LCSW

## 2021-11-30 DIAGNOSIS — J189 Pneumonia, unspecified organism: Secondary | ICD-10-CM | POA: Diagnosis not present

## 2021-11-30 LAB — GLUCOSE, CAPILLARY
Glucose-Capillary: 109 mg/dL — ABNORMAL HIGH (ref 70–99)
Glucose-Capillary: 74 mg/dL (ref 70–99)
Glucose-Capillary: 86 mg/dL (ref 70–99)
Glucose-Capillary: 197 mg/dL — ABNORMAL HIGH (ref 70–99)
Glucose-Capillary: 69 mg/dL — ABNORMAL LOW (ref 70–99)
Glucose-Capillary: 81 mg/dL (ref 70–99)

## 2021-11-30 MED ORDER — BACLOFEN 10 MG PO TABS
10.0000 mg | ORAL_TABLET | Freq: Every day | ORAL | Status: DC
  Administered 2021-11-30 – 2021-12-07 (×8): 10 mg via ORAL
  Filled 2021-11-30 (×8): qty 1

## 2021-11-30 NOTE — Progress Notes (Signed)
PROGRESS NOTE   Subjective/Complaints: First day of therapy went well Discussed with patient, SW, and husband applying for [facility] where she has family who works there.  ROS: +headaches, +ankle pain  Objective:   No results found. Recent Labs    11/28/21 0531  WBC 9.3  HGB 13.1  HCT 40.2  PLT 378   Recent Labs    11/28/21 0531  NA 138  K 3.9  CL 97*  CO2 32  GLUCOSE 88  BUN 12  CREATININE 0.94  CALCIUM 10.2    Intake/Output Summary (Last 24 hours) at 11/30/2021 1642 Last data filed at 11/30/2021 1300 Gross per 24 hour  Intake 720 ml  Output --  Net 720 ml        Physical Exam: Vital Signs Blood pressure 128/76, pulse 95, temperature 97.8 F (36.6 C), temperature source Oral, resp. rate 18, height 5\' 7"  (1.702 m), weight 98.9 kg, SpO2 95 %. Gen: no distress, normal appearing HEENT: oral mucosa pink and moist, NCAT Cardio: Reg rate Chest: normal effort, normal rate of breathing Abd: soft, non-distended Ext: no edema Psych: pleasant, normal affect Skin: intact Neurologic: Cranial nerves II through XII intact, motor strength is 5/5 in RIght and 3-/5 left deltoid, bicep, tricep, grip, hip flexor, knee extensors, ankle dorsiflexor and plantar flexor Sensory exam normal sensation to light touch and proprioception in bilateral upper and lower extremities Tone- increased Left finger and wrist flexor tone, and left hip adductor tone  Musculoskeletal: Left foot tenderness.    Assessment/Plan: 1. Functional deficits which require 3+ hours per day of interdisciplinary therapy in a comprehensive inpatient rehab setting. Physiatrist is providing close team supervision and 24 hour management of active medical problems listed below. Physiatrist and rehab team continue to assess barriers to discharge/monitor patient progress toward functional and medical goals  Care Tool:  Bathing  Bathing activity did not  occur: Refused           Bathing assist       Upper Body Dressing/Undressing Upper body dressing Upper body dressing/undressing activity did not occur (including orthotics): Refused What is the patient wearing?: Pull over shirt    Upper body assist Assist Level: Moderate Assistance - Patient 50 - 74%    Lower Body Dressing/Undressing Lower body dressing    Lower body dressing activity did not occur: Refused What is the patient wearing?: Pants     Lower body assist Assist for lower body dressing: Maximal Assistance - Patient 25 - 49%     Toileting Toileting    Toileting assist Assist for toileting: Total Assistance - Patient < 25% ([place])     Transfers Chair/bed transfer  Transfers assist     Chair/bed transfer assist level: Moderate Assistance - Patient 50 - 74%     Locomotion Ambulation   Ambulation assist   Ambulation activity did not occur: Safety/medical concerns (Significant BLE weakness, fear-avoidance behavior, L hemi and ankle fx)  Assist level: Minimal Assistance - Patient > 75% Assistive device: Walker-hemi Max distance: 31'   Walk 10 feet activity   Assist  Walk 10 feet activity did not occur: Safety/medical concerns (Significant BLE weakness, fear-avoidance behavior, L hemi and  ankle fx)  Assist level: Minimal Assistance - Patient > 75% Assistive device: Walker-hemi   Walk 50 feet activity   Assist Walk 50 feet with 2 turns activity did not occur: Safety/medical concerns (Significant BLE weakness, fear-avoidance behavior, L hemi and ankle fx)         Walk 150 feet activity   Assist Walk 150 feet activity did not occur: Safety/medical concerns (Significant BLE weakness, fear-avoidance behavior, L hemi and ankle fx)         Walk 10 feet on uneven surface  activity   Assist Walk 10 feet on uneven surfaces activity did not occur: Safety/medical concerns (Significant BLE weakness, fear-avoidance behavior, L hemi and ankle  fx)         Wheelchair     Assist Is the patient using a wheelchair?: Yes Type of Wheelchair: Manual    Wheelchair assist level: Supervision/Verbal cueing, Set up assist Max wheelchair distance: 100'    Wheelchair 50 feet with 2 turns activity    Assist        Assist Level: Supervision/Verbal cueing   Wheelchair 150 feet activity     Assist      Assist Level: Moderate Assistance - Patient 50 - 74%   Blood pressure 128/76, pulse 95, temperature 97.8 F (36.6 C), temperature source Oral, resp. rate 18, height 5\' 7"  (1.702 m), weight 98.9 kg, SpO2 95 %.    Medical Problem List and Plan: 1.  Debility functional deficits secondary to acute hypoxic respiratory failure/community-acquired pneumonia.  Antibiotic therapy completed.  Wean oxygen to room air             -patient may  shower             -ELOS/Goals: 9-12d, sup/minA goals  Continue CIR 2.  Impaired mobility, lateral steps: continue Lovenox             -antiplatelet therapy: Aspirin 81 mg daily, Plavix 75 mg daily 3. Pain Management/chronic pain: continue Lyrica 225 mg twice daily, hydrocodone as needed, Flexeril 10 mg nightly, Advil as needed 4. Mood/PTSD: Continue Lexapro 20 mg daily, Lamictal 200 mg nightly, Thorazine 100 mg nightly, Cogentin 0.5 mg nightly, Klonopin 0.5 mg twice daily             -antipsychotic agents: [name] 5. Neuropsych: This patient is capable of making decisions on her own behalf. 6. Skin/Wound Care: Routine skin checks 7. Fluids/Electrolytes/Nutrition: Routine in and outs with follow-up chemistries 8.  Avulsion fracture medial malleolus left foot status post fall.  [facility] orthopedic service Dr. Roland Rack.  Aircast as needed. CAM walker ordered. Weightbearing as tolerated 9.  History of right pontine infarction.  Patient received CIR July 2022.  Continue aspirin and Plavix 10.  CAD/CABG.  No chest pain or shortness of breath.  Continue Plavix/aspirin 11.  Hypertension.   Coreg 6.25 mg twice daily, Imdur 60 mg twice daily, Aldactone 25 mg daily, Demadex 10 mg daily.  Monitor with increased mobility 12.  Diabetes mellitus.  Hemoglobin A1c 7.0.  Semglee 54 units daily.  Check blood sugars before meals and at bedtime 13.  Diastolic congestive heart failure.  Continue Demadex 10 mg daily.  Monitor for any signs of fluid overload 14.  Hyperlipidemia.  Crestor/Zetia 15.  GERD.  Continue Protonix 16.  Constipation.  Continue MiraLAX twice daily, Senokot S2 tablets twice daily 17.  Spasticity related to CVA , may benefit from Botox as Outpt. Increase baclofen to 10mg  HS   LOS: 3 days A  FACE TO FACE EVALUATION WAS PERFORMED  Erika [name] 11/30/2021, 4:42 PM

## 2021-11-30 NOTE — IPOC Note (Signed)
Overall Plan of Care [facility]) Patient Details Name: Erika [name] MRN: [id number] DOB: 1971/06/18  Admitting Diagnosis: CAP (community acquired pneumonia)  Hospital Problems: Principal Problem:   CAP (community acquired pneumonia) Active Problems:   Debility     Functional Problem List: Nursing Bowel, Medication Management, Safety, Endurance, Pain  PT Balance, Behavior, Endurance, Motor, Pain, Safety  OT Balance, Motor, Pain, Safety  SLP Cognition, Safety  TR         Basic ADL's: OT Grooming, Bathing, Dressing, Toileting     Advanced  ADL's: OT       Transfers: PT Bed Mobility, Bed to Chair, Car, Manufacturing systems engineer, Metallurgist: PT Ambulation, Emergency planning/management officer     Additional Impairments: OT Fuctional Use of Upper Extremity  SLP Social Cognition   Problem Solving, Memory, Attention  TR      Anticipated Outcomes Item Anticipated Outcome  Self Feeding [name]  Swallowing      Basic self-care  Min A  Toileting  Min A   Bathroom Transfers Min A  Bowel/Bladder  manage bowel w mod I assist  Transfers  Min A  Locomotion  Min A  Communication     Cognition  Supervision  Pain  pain at or below level 4 with prns/scheduled medications  Safety/Judgment  maintain safety w cues/reminders   Therapy Plan: PT Intensity: Minimum of 1-2 x/day ,45 to 90 minutes PT Frequency: 5 out of 7 days PT Duration Estimated Length of Stay: 12-14 days OT Intensity: Minimum of 1-2 x/day, 45 to 90 minutes OT Frequency: 5 out of 7 days OT Duration/Estimated Length of Stay: 2-2.5 weeks SLP Intensity: Minumum of 1-2 x/day, 30 to 90 minutes SLP Frequency: 1 to 3 out of 7 days SLP Duration/Estimated Length of Stay: 2 weeks   Due to the current state of emergency, patients may not be receiving their 3-hours of Medicare-mandated therapy.   Team Interventions: Nursing Interventions Disease Management/Prevention, Bowel Management, Pain Management,  Patient/Family Education, Medication Management, Discharge Planning  PT interventions Ambulation/gait training, Community reintegration, DME/adaptive equipment instruction, Neuromuscular re-education, Psychosocial support, UE/LE Strength taining/ROM, Wheelchair propulsion/positioning, Training and development officer, Discharge planning, Functional electrical stimulation, Pain management, Skin care/wound management, Therapeutic Activities, UE/LE Coordination activities, Visual/perceptual remediation/compensation, Therapeutic Exercise, Patient/family education, Splinting/orthotics, Functional mobility training, Disease management/prevention, Cognitive remediation/compensation  OT Interventions Balance/vestibular training, Community reintegration, Discharge planning, DME/adaptive equipment instruction, Functional electrical stimulation, Functional mobility training, Neuromuscular re-education, Pain management, Patient/family education, Self Care/advanced ADL retraining, Therapeutic Activities, Therapeutic Exercise, UE/LE Strength taining/ROM, UE/LE Coordination activities, Wheelchair propulsion/positioning  SLP Interventions Cognitive remediation/compensation, English as a second language teacher, Functional tasks, Patient/family education, Therapeutic Activities, Therapeutic Exercise, Internal/external aids  TR Interventions    SW/CM Interventions Discharge Planning, Psychosocial Support, Patient/Family Education   Barriers to Discharge MD  Medical stability  Nursing Decreased caregiver support 1 level ramped emtry w spouse and daughter, has DME and mother in law assists prn  PT Decreased caregiver support, Insurance for SNF coverage, Behavior, Other (comments) Impairments from previous CVA  OT      SLP      SW Insurance for SNF coverage     Team Discharge Planning: Destination: PT-Home ,OT- Home , SLP-Home Projected Follow-up: PT-Skilled nursing facility, OT-  Outpatient OT, SLP-Outpatient SLP Projected Equipment  Needs: PT-To be determined, OT- To be determined, SLP-None recommended by SLP Equipment Details: PT- , OT-  Patient/family involved in discharge planning: PT- Patient,  OT-Patient, SLP-Patient  MD ELOS: 9-12 days Medical Rehab Prognosis:  Excellent Assessment: Mrs.  [name] is a 50 year old woman who is admitted to CIR with debility functional deficits secondary to acute hypoxic respiratory failure/community-acquired pneumonia.  Antibiotic therapy completed. Medications are being managed, and labs and vitals are being monitored regularly.      See Team Conference Notes for weekly updates to the plan of care

## 2021-11-30 NOTE — Progress Notes (Signed)
Physical Therapy Session Note  Patient Details  Name: Erika [name] MRN: [id number] Date of Birth: 05/11/1971  Today's Date: 11/30/2021 PT Individual Time: 0802-0832 PT Individual Time Calculation (min): 30 min   Short Term Goals: Week 1:  PT Short Term Goal 1 (Week 1): Pt will perform sit <>stand w/mod A and LRAD PT Short Term Goal 2 (Week 1): Pt will perform bed <>chair transfer w/mod A and LRAD PT Short Term Goal 3 (Week 1): Pt will ambulate 20' w/mod A and LRAD Week 2:    Week 3:     Skilled Therapeutic Interventions/Progress Updates:     Pt initially supine.  Supine to sit w/mod assist and cues to attend to L hemibody w/transition.  Therapist donned Cam boot for time management. stand pivot transfer to wc w/min to mod assist. Pt requested assist to BR. Sit to stand in stedy w/cga and transported to commode. Stand to sit w/cga, supervision for toileting, mod assist for clothing management.  Semi sit to stand in stedy and worked on L quad acitivation, midline posture hips/shoulders.  Stood 2 min x 2 w/activity, frequent cues to coorect L knee flexion and L hip retr4action. Pt left oob in wc w/alarm belt set and needs in reach  Therapy Documentation Precautions:  Precautions Precautions: Fall Precaution Comments: Hx recurrent falls Required Braces or Orthoses: Other Brace Other Brace: CAM boot on LLE Restrictions Weight Bearing Restrictions: Yes LLE Weight Bearing: Weight bearing as tolerated Other Position/Activity Restrictions: WBAT LLE with air cast or CAM boot?   Therapy/Group: Individual Therapy Callie Fielding, [place] 11/30/2021, 9:06 AM

## 2021-11-30 NOTE — Progress Notes (Signed)
Speech Language Pathology Daily Session Note  Patient Details  Name: Erika [name] MRN: [id number] Date of Birth: 11/08/1971  Today's Date: 11/30/2021 SLP Individual Time: 1300-1345 SLP Individual Time Calculation (min): 45 min  Short Term Goals: Week 1: SLP Short Term Goal 1 (Week 1): Pt will use external aids to organize/recall appointments, daily schedules for family and medical information with min A SLP Short Term Goal 2 (Week 1): Pt will verbalize 3-4 sequencing steps for transfers/gait training with min A to reduce impulsivity and risk of falls SLP Short Term Goal 3 (Week 1): Pt will complete moderately complex problem solving tasks with min A SLP Short Term Goal 4 (Week 1): Pt will participate in selective attention tasks provided min A verbal and visual cues  Skilled Therapeutic Interventions:   Patient seen with mother in law present for skilled ST session focusing on cognitive function goals. Patient in bed and agreeable to session. Of note, SLP remembers patient from previous CIR stay and she appears less impulsive with her actions and speech. Patient completed task of filling out event on calendar without cues but when completing task which required time calculation, she required modA cues for both attention and problem solving. Patient then read 4 paragraph short story. She was able to summarize main points of story with SLP providing minimalA context cues. When asked more inferential questions, she required min-modA cues to perform accurately. Patient left in bed with all needs within reach. She continues to benefit from skilled SLP intervention to maximize cognitive function prior to discharge.   Pain Pain Assessment Pain Scale: 0-10 Pain Score: 0-No pain Pain Location: Leg Pain Orientation: Left Pain Intervention(s): Medication (See eMAR)  Therapy/Group: Individual Therapy  Sonia Baller, MA, CCC-SLP Speech Therapy

## 2021-11-30 NOTE — Progress Notes (Signed)
Occupational Therapy Session Note  Patient Details  Name: Erika [name] MRN: [id number] Date of Birth: 1971-04-26  Today's Date: 11/30/2021 OT Individual Time: 1030-1100 OT Individual Time Calculation (min): 30 min    Short Term Goals: Week 1:  OT Short Term Goal 1 (Week 1): Patient will don UB clothing with set-up assist with use of hemi technique. OT Short Term Goal 2 (Week 1): Patient will complete toilet transfer via squat-pivot and use of hemi-walker. OT Short Term Goal 3 (Week 1): Patient will complete 1/3 parts of LB dressing with Mod A.  Skilled Therapeutic Interventions/Progress Updates:    Pt seen this session to focus on functional mobility. Pt reported that she moves from supine to sit by pulling up on her daughter.  She could not recall the hemi techniques that were taught to her during her 3 weeks of inpt rehab.  Instructed pt to roll fully onto her side, tuck her knees in and push up to sit with RUE. Pt able to do this with CGA.    Worked on sit to stand using [place]. Pt initially needing mod A as she was locking out B knees so she was hyperextending legs. Cued to shift wt forward to allow her to rise to stand more smoothly, she was then able to rise with min A. In standing, worked on tolerance to L foot wt bearing with slowly moving R foot forward and back but just too painful for pt.  Worked on squat pivot to R to wc with mod a due to pt having difficulty with wt shift forward and to the R.    Pt resting in wc with all needs met and alarm on.    Therapy Documentation Precautions:  Precautions Precautions: Fall Precaution Comments: Hx recurrent falls Required Braces or Orthoses: Other Brace Other Brace: CAM boot on LLE Restrictions Weight Bearing Restrictions: Yes LLE Weight Bearing: Weight bearing as tolerated Other Position/Activity Restrictions: WBAT LLE with air cast or CAM boot?    Vital Signs: Therapy Vitals Temp: 97.7 F (36.5 C) Pulse Rate:  67 Resp: 18 BP: 137/70 Patient Position (if appropriate): Lying Oxygen Therapy SpO2: 92 % O2 Device: Room Air Pain: 5/10 in L ankle with WB - premedicated      Therapy/Group: Individual Therapy  [place] 11/30/2021, 8:29 AM

## 2021-11-30 NOTE — Progress Notes (Signed)
Patient ID: Erika [name], female   DOB: April 02, 1971, 50 y.o.   MRN: [id number] Message left for [facility] to see if has any information regarding insurance approval for SNF. Await return call

## 2021-11-30 NOTE — Progress Notes (Signed)
Physical Therapy Session Note  Patient Details  Name: Erika [name] MRN: [id number] Date of Birth: 1971/12/10  Today's Date: 11/30/2021 PT Individual Time: 1132-1210 PT Individual Time Calculation (min): 38 min   Short Term Goals: Week 1:  PT Short Term Goal 1 (Week 1): Pt will perform sit <>stand w/mod A and LRAD PT Short Term Goal 2 (Week 1): Pt will perform bed <>chair transfer w/mod A and LRAD PT Short Term Goal 3 (Week 1): Pt will ambulate 20' w/mod A and LRAD  Skilled Therapeutic Interventions/Progress Updates: Pt presented in w/c agreeable to therapy. Pt states pain in LLE but did not rate. Pt in CAM boot and states although continues to be sore feels more comfortable then air cast. No c/o increased pain during session. Pt transported to 4th floor gym for time management. Pt performed stand pivot transfer to NuStep with modA for power up minA for transfer with mod multimodal cues for knee extension when weight shifting to L. Pt participated in NuStep L4 x 8 min for general conditioning. Pt able to maintain 30-40 SPM for mild exertion. Once completed pt performed stand pivot transfer to w/c in same manner as prior. Pt transported back to room and performed stand pivot with minA due to pt holding bed rail vs hemi-walker. Once sitting EOB performed sit to supine with minA for LLE management although pt was able to lift LLE using RLE and nearly get onto bed. Pt repositioned to comfort and left with bed alarm on, call bell within reach and needs met.      Therapy Documentation Precautions:  Precautions Precautions: Fall Precaution Comments: Hx recurrent falls Required Braces or Orthoses: Other Brace Other Brace: CAM boot on LLE Restrictions Weight Bearing Restrictions: Yes LLE Weight Bearing: Weight bearing as tolerated Other Position/Activity Restrictions: WBAT LLE with air cast or CAM boot? General:   Vital Signs:   Pain: Pain Assessment Pain Scale: 0-10 Pain Score: 5   Pain Type: Chronic pain Pain Location: Leg Pain Orientation: Left Pain Descriptors / Indicators: Aching Pain Intervention(s): Medication (See eMAR)    Therapy/Group: Individual Therapy  [name] 11/30/2021, 12:47 PM

## 2021-11-30 NOTE — Progress Notes (Signed)
Physical Therapy Session Note  Patient Details  Name: Erika [name] MRN: [id number] Date of Birth: 1971-08-30  Today's Date: 11/30/2021 PT Individual Time: 9326-7124 PT Individual Time Calculation (min): 60 min  and Today's Date: 11/30/2021 PT Missed Time: 15 Minutes Missed Time Reason: Patient fatigue  Short Term Goals: Week 1:  PT Short Term Goal 1 (Week 1): Pt will perform sit <>stand w/mod A and LRAD PT Short Term Goal 2 (Week 1): Pt will perform bed <>chair transfer w/mod A and LRAD PT Short Term Goal 3 (Week 1): Pt will ambulate 20' w/mod A and LRAD  Skilled Therapeutic Interventions/Progress Updates:  Pt received supine in bed, mother present in room. Pt reported pain as 6/10 in L ankle and was not due for pain meds. Offered repositioning and distraction throughout session for pain modulation. Emphasis of session on sit <>stand transfers, L NMR and gait training. Donned CAM boot on LLE w/total A and pt performed supine <>sit w/min A. Sit <>stand pivot from bed to [facility] w/mod A and no AD for posterior bias correction and LUE management.   Blocked sit <>stand practice from [facility] to hemi-walker for improved anterior weight shift and BLE strength. Pt performed 7 sit <>stands w/min A, heavy verbal cues for anterior weight shift as pt demonstrates heavy posterior lean. Noted significant difficulty maintaining knee extension bilaterally as she fatigued and heavy push against WC w/calves to brace herself up. Stand <>sit w/min A and mod verbal cues for improved eccentric control for BLE strength. Pt self-propelled 150' using hemi-technique slowly, requiring rest breaks due to PAD pain in RLE.  Gait training  -Pt performed sit <>stands w/min A and ambulated 51' and 22' w/HW and min A for LUE support. Noted significant toe out of LLE, step length of LLE > RLE, downward gaze, R trunk lean and slight circumduction of LLE. Min verbal cues for forward gaze and min tactile cues for lateral weight shift  facilitation. Pt reported significant fatigue following gait trials and requested to end session early. Pt self-propelled 100' back to room w/hemi-technique and S* and performed sit <>stand pivot from WC to EOB w/mod A and no AD. Sit <>supine w/CGA and pt was left supine in bed, all needs in reach. Nursing notified to administer pain meds. Missed 15 minutes of skilled PT due to fatigue.   Therapy Documentation Precautions:  Precautions Precautions: Fall Precaution Comments: Hx recurrent falls Required Braces or Orthoses: Other Brace Other Brace: CAM boot on LLE Restrictions Weight Bearing Restrictions: Yes LLE Weight Bearing: Weight bearing as tolerated Other Position/Activity Restrictions: WBAT LLE with air cast or CAM boot?   Therapy/Group: Individual Therapy Cruzita Lederer Lue Sykora, PT, DPT  11/30/2021, 7:48 AM

## 2021-12-01 DIAGNOSIS — J189 Pneumonia, unspecified organism: Secondary | ICD-10-CM | POA: Diagnosis not present

## 2021-12-01 LAB — GLUCOSE, CAPILLARY
Glucose-Capillary: 100 mg/dL — ABNORMAL HIGH (ref 70–99)
Glucose-Capillary: 98 mg/dL (ref 70–99)
Glucose-Capillary: 125 mg/dL — ABNORMAL HIGH (ref 70–99)
Glucose-Capillary: 155 mg/dL — ABNORMAL HIGH (ref 70–99)

## 2021-12-01 NOTE — Progress Notes (Addendum)
Patient ID: Erika [name], female   DOB: 1971-06-13, 50 y.o.   MRN: [id number] [facility]aged [facility] to find out if any information regarding [facility] for SNF. Await return call or text.  2:06 PM Met with pt to inform have not heard from [facility]. Pt's sister works there also and will have her check with [facility]. Aware at times insurance takes 2-4 days to [facility]. Will await decision from insurance.

## 2021-12-01 NOTE — Progress Notes (Signed)
Occupational Therapy Session Note  Patient Details  Name: Erika [name] MRN: [id number] Date of Birth: Jul 04, 1971  Today's Date: 12/01/2021 OT Individual Time: 1005-1100 OT Individual Time Calculation (min): 55 min    Short Term Goals: Week 1:  OT Short Term Goal 1 (Week 1): Patient will don UB clothing with set-up assist with use of hemi technique. OT Short Term Goal 2 (Week 1): Patient will complete toilet transfer via squat-pivot and use of hemi-walker. OT Short Term Goal 3 (Week 1): Patient will complete 1/3 parts of LB dressing with Mod A.  Skilled Therapeutic Interventions/Progress Updates:    Pt received in bed asking to get her hair washed.  Was considering if I would be able to get pt in shower today as the set up in her room requires her to be able to do a good squat pivot. Unable to use stedy to shower bench.   Told pt I would assess how her transfers looked today.  Pt needed min A to sit to EOB as she was unable to engage her core musculature. Pt attempted 3 x to stand to hemiwalker but could not use R leg enough to power herself up. Pt kept saying, "I dont know what is wrong, maybe I am not awake enough".  Opted to use stedy for safety to transfer to wc.  Once in wc, pt positioned at sink to be able to use hair washing tray and therapist washed pt' hair. She was then positioned so she could engage in self care. See ADL documentation below.   Pt resting in wc with all needs met. Call light in reach. Hand off to PT for next session.   Therapy Documentation Precautions:  Precautions Precautions: Fall Precaution Comments: Hx recurrent falls Required Braces or Orthoses: Other Brace Other Brace: CAM boot on LLE Restrictions Weight Bearing Restrictions: No LLE Weight Bearing: Weight bearing as tolerated Other Position/Activity Restrictions: WBAT LLE with air cast or CAM boot?  Pain: Pain Assessment Pain Scale: 0-10 Pain Score: 0-No pain ADL: ADL Eating: Set  up Where Assessed-Eating: Bed level Grooming: Setup Where Assessed-Grooming: Sitting at sink Upper Body Bathing: Minimal assistance Where Assessed-Upper Body Bathing: Sitting at sink Lower Body Bathing: Minimal assistance Where Assessed-Lower Body Bathing: Sitting at sink, Standing at sink Upper Body Dressing: Minimal assistance Where Assessed-Upper Body Dressing: Wheelchair Lower Body Dressing: Moderate assistance Where Assessed-Lower Body Dressing: Wheelchair Toileting: Moderate assistance Where Assessed-Toileting: Glass blower/designer: Dependent Armed forces technical officer Method: Other (comment) Customer service manager) Science writer: Radiographer, therapeutic: Not assessed   Therapy/Group: Individual Therapy  [place] 12/01/2021, 12:37 PM

## 2021-12-01 NOTE — Progress Notes (Signed)
PROGRESS NOTE   Subjective/Complaints: Sleepy this morning.  BP elevated- normal for her given stenosis Other vitals stable Appreciate SW following up regarding SNF  ROS: +headaches, +ankle pain, +depression  Objective:   No results found. No results for input(s): WBC, HGB, HCT, PLT in the last 72 hours.  No results for input(s): NA, K, CL, CO2, GLUCOSE, BUN, CREATININE, CALCIUM in the last 72 hours.   Intake/Output Summary (Last 24 hours) at 12/01/2021 1201 Last data filed at 12/01/2021 0700 Gross per 24 hour  Intake 780 ml  Output --  Net 780 ml        Physical Exam: Vital Signs Blood pressure (!) 151/86, pulse 67, temperature 97.8 F (36.6 C), temperature source Oral, resp. rate 18, height 5\' 7"  (1.702 m), weight 98.9 kg, SpO2 92 %. Gen: no distress, normal appearing HEENT: oral mucosa pink and moist, NCAT Cardio: Reg rate Chest: normal effort, normal rate of breathing Abd: soft, non-distended Ext: no edema Psych: pleasant, normal affect, +anxiety Skin: intact Neurologic: Cranial nerves II through XII intact, motor strength is 5/5 in RIght and 3-/5 left deltoid, bicep, tricep, grip, hip flexor, knee extensors, ankle dorsiflexor and plantar flexor Sensory exam normal sensation to light touch and proprioception in bilateral upper and lower extremities Tone- increased Left finger and wrist flexor tone, and left hip adductor tone  Musculoskeletal: Left foot tenderness.    Assessment/Plan: 1. Functional deficits which require 3+ hours per day of interdisciplinary therapy in a comprehensive inpatient rehab setting. Physiatrist is providing close team supervision and 24 hour management of active medical problems listed below. Physiatrist and rehab team continue to assess barriers to discharge/monitor patient progress toward functional and medical goals  Care Tool:  Bathing  Bathing activity did not occur:  Refused           Bathing assist       Upper Body Dressing/Undressing Upper body dressing Upper body dressing/undressing activity did not occur (including orthotics): Refused What is the patient wearing?: Pull over shirt    Upper body assist Assist Level: Moderate Assistance - Patient 50 - 74%    Lower Body Dressing/Undressing Lower body dressing    Lower body dressing activity did not occur: Refused What is the patient wearing?: Pants     Lower body assist Assist for lower body dressing: Maximal Assistance - Patient 25 - 49%     Toileting Toileting    Toileting assist Assist for toileting: Total Assistance - Patient < 25% (Stedy)     Transfers Chair/bed transfer  Transfers assist     Chair/bed transfer assist level: Moderate Assistance - Patient 50 - 74%     Locomotion Ambulation   Ambulation assist   Ambulation activity did not occur: Safety/medical concerns (Significant BLE weakness, fear-avoidance behavior, L hemi and ankle fx)  Assist level: Minimal Assistance - Patient > 75% Assistive device: Walker-hemi Max distance: 31'   Walk 10 feet activity   Assist  Walk 10 feet activity did not occur: Safety/medical concerns (Significant BLE weakness, fear-avoidance behavior, L hemi and ankle fx)  Assist level: Minimal Assistance - Patient > 75% Assistive device: Walker-hemi   Walk 50 feet activity  Assist Walk 50 feet with 2 turns activity did not occur: Safety/medical concerns (Significant BLE weakness, fear-avoidance behavior, L hemi and ankle fx)         Walk 150 feet activity   Assist Walk 150 feet activity did not occur: Safety/medical concerns (Significant BLE weakness, fear-avoidance behavior, L hemi and ankle fx)         Walk 10 feet on uneven surface  activity   Assist Walk 10 feet on uneven surfaces activity did not occur: Safety/medical concerns (Significant BLE weakness, fear-avoidance behavior, L hemi and ankle fx)          Wheelchair     Assist Is the patient using a wheelchair?: Yes Type of Wheelchair: Manual    Wheelchair assist level: Supervision/Verbal cueing, Set up assist Max wheelchair distance: 100'    Wheelchair 50 feet with 2 turns activity    Assist        Assist Level: Supervision/Verbal cueing   Wheelchair 150 feet activity     Assist      Assist Level: Moderate Assistance - Patient 50 - 74%   Blood pressure (!) 151/86, pulse 67, temperature 97.8 F (36.6 C), temperature source Oral, resp. rate 18, height 5\' 7"  (1.702 m), weight 98.9 kg, SpO2 92 %.    Medical Problem List and Plan: 1.  Debility functional deficits secondary to acute hypoxic respiratory failure/community-acquired pneumonia.  Antibiotic therapy completed.  Wean oxygen to room air             -patient may  shower             -ELOS/Goals: 9-12d, sup/minA goals  Continue CIR 2.  Impaired mobility, lateral steps: continue Lovenox             -antiplatelet therapy: Aspirin 81 mg daily, Plavix 75 mg daily 3. Pain Management/chronic pain: continue Lyrica 225 mg twice daily, hydrocodone as needed, Flexeril 10 mg nightly, Advil as needed 4. Mood/PTSD: Continue Lexapro 20 mg daily, Lamictal 200 mg nightly, Thorazine 100 mg nightly, Cogentin 0.5 mg nightly, Klonopin 0.5 mg twice daily             -antipsychotic agents: [name] 5. Neuropsych: This patient is capable of making decisions on her own behalf. 6. Skin/Wound Care: Routine skin checks 7. Fluids/Electrolytes/Nutrition: Routine in and outs with follow-up chemistries 8.  Avulsion fracture medial malleolus left foot status post fall.  [facility] service Dr. Roland Rack.  Aircast as needed. CAM walker ordered. Weightbearing as tolerated 9.  History of right pontine infarction.  Patient received CIR July 2022.  Continue aspirin and Plavix 10.  CAD/CABG.  No chest pain or shortness of breath.  Continue Plavix/aspirin 11.  Hypertension.  Elevated, but  below goal for her given her ICA stenosis- continue Coreg 6.25 mg twice daily, Imdur 60 mg twice daily, Aldactone 25 mg daily, Demadex 10 mg daily.  Monitor with increased mobility 12.  Diabetes mellitus.  Hemoglobin A1c 7.0.  Semglee 54 units daily.  Check blood sugars before meals and at bedtime 13.  Diastolic congestive heart failure.  Continue Demadex 10 mg daily.  Monitor for any signs of fluid overload 14.  Hyperlipidemia.  Crestor/Zetia 15.  GERD.  Continue Protonix 16.  Constipation.  Continue MiraLAX twice daily, Senokot S2 tablets twice daily 17.  Spasticity related to CVA , may benefit from Botox as Outpt. Increase baclofen to 10mg  HS   LOS: 4 days A FACE TO FACE EVALUATION WAS PERFORMED  [place]ton [place] P Yousof Alderman  12/01/2021, 12:01 PM

## 2021-12-01 NOTE — Progress Notes (Signed)
Speech Language Pathology Daily Session Note  Patient Details  Name: Erika [name] MRN: [id number] Date of Birth: 1971-11-20  Today's Date: 12/01/2021 SLP Individual Time: 1435-1500 SLP Individual Time Calculation (min): 25 min  Short Term Goals: Week 1: SLP Short Term Goal 1 (Week 1): Pt will use external aids to organize/recall appointments, daily schedules for family and medical information with min A SLP Short Term Goal 2 (Week 1): Pt will verbalize 3-4 sequencing steps for transfers/gait training with min A to reduce impulsivity and risk of falls SLP Short Term Goal 3 (Week 1): Pt will complete moderately complex problem solving tasks with min A SLP Short Term Goal 4 (Week 1): Pt will participate in selective attention tasks provided min A verbal and visual cues SLP Short Term Goal 5 (Week 1): Patient will utilize word-finding strategied during high-level structured language tasks with Mod I.  Skilled Therapeutic Interventions: Skilled treatment session focused on cognitive goals. Upon arrival, patient was asleep and required extra time for arousal. SLP facilitated session by providing supervision level verbal cues for recall of events from previous therapy session. Patient reports she feels she is close to her cognitive baseline but is experiencing difficulty with word-finding as well as thought organization. Word-finding issues were not observed during session but SLP will initiate a goal and treatment plan per patient request. Patient left upright in bed with alarm on and all needs within reach. Continue with current plan of care.      Pain No/Denies Pain   Therapy/Group: Individual Therapy  Ewin Rehberg 12/01/2021, 3:12 PM

## 2021-12-01 NOTE — Progress Notes (Signed)
Physical Therapy Session Note  Patient Details  Name: Erika [name] MRN: [id number] Date of Birth: 07-20-1971  Today's Date: 12/01/2021 PT Individual Time: 5638-7564; 3329-5188  PT Individual Time Calculation (min): 54 min and 59 min  Short Term Goals: Week 1:  PT Short Term Goal 1 (Week 1): Pt will perform sit <>stand w/mod A and LRAD PT Short Term Goal 2 (Week 1): Pt will perform bed <>chair transfer w/mod A and LRAD PT Short Term Goal 3 (Week 1): Pt will ambulate 20' w/mod A and LRAD  Skilled Therapeutic Interventions/Progress Updates:  Session 1 Pt received seated in WC in room, reported 8/10 pain in L ankle and was premedicated, requested pain meds at end of session . Offered repositioning and distraction throughout session for pain modulation. Emphasis of session on L NMR, global strength and gait training. Pt self-propelled >300' using hemi-technique from room to [facility] room. Obtained L lap tray to provide support to LUE in WC. Pt transported to main gym w/total A 2/2 fatigue.   In // bars for improved BLE strength, anterior weight shift and biofeedback for midline orientation:  -Sit <>stands x6 w/mod A for LUE management, anterior weight shift, L knee blocking and tactile cues for midline orientation. Placed mirror in front of pt to provide biofeedback on trunk position. Pt demonstrated significant R trunk lean, maintained L knee flexion and hip flexion. Pt reported feeling as though her leg could not move, attempted taking a few steps forward and backward, but pt unable to weightshift to L to progress RLE. Manual facilitation of lateral weightshift, but pt's L knee unable to hold her weight when shifting to L side. Stand <>sit w/mod A for eccentric control.   Pt transported back to room w/total A 2/2 fatigue and performed the following exercises in [facility] for RLE strength:  -LAQ w/ankle pump x20, 3s eccentric contraction  -Seated marches, 2x10, w/3s isometric hold   Pt was left seated  in WC in room, LUE propped in WC, all needs in reach. Nursing notified of pt's request for pain meds.   Session 2  Pt received seated in WC, reported 7/10 pain in L ankle and had just received pain meds. Offered distraction and modalities during session for pain modulation. Pt reported high levels of fatigue and no strength in her legs, preferred to to seated and bed level exercises only for session. Emphasis of session on BLE strength and endurance. Pt performed sit <>stand pivot w/max A and no AD, heavy posterior lean and bilateral knee buckle. Required manual facilitation of weight shift and movement of BLEs in order to perform transfer. Educated pt on performing squat pivot for safety, but pt reported she does not like squat pivots and prefers stand pivots, despite a majority of her falls occurring during stand pivot transfers. Will continue to encourage safer transfer method.   The following exercises were performed in bed for improved BLE strength, LUE approximation and core strength:  -Hip adduction w/pillow, x20 w/2s isometric hold -Sit <>supine w/min A for LLE management  -Supine bridges, 3x10 w/2s isometric hold. Min A to get pt in position due to L hemiplegia  -Supine alt. Marches, 3x10 per side. Noted pt demonstrates increased quad activation in supine compared to sitting.   -Supine HS stretch on L side w/gait belt, 3x45s hold  -Supine hip abduction w/orange theraband, 2x15 w/3s isometric hold   Pt requested to remove CAM boot due to increased pain, removed boot w/total A and propped ankle on pillow.  Pt was left supine in bed, ice pack on L ankle, all needs in reach.   Therapy Documentation Precautions:  Precautions Precautions: Fall Precaution Comments: Hx recurrent falls Required Braces or Orthoses: Other Brace Other Brace: CAM boot on LLE Restrictions Weight Bearing Restrictions: No LLE Weight Bearing: Weight bearing as tolerated Other Position/Activity Restrictions: WBAT LLE  with air cast or CAM boot?   Therapy/Group: Individual Therapy Cruzita Lederer Alicianna Litchford, PT, DPT  12/01/2021, 7:44 AM

## 2021-12-01 NOTE — Progress Notes (Signed)
Hypoglycemic Event  CBG: 69  Treatment: OJ and graham crackers  Symptoms: None  Follow-up CBG: Time:  21:37 CBG Result: 81  Possible Reasons for Event: Inadequate meal intake  Comments/MD notified: Protocol followed    Chilton Si

## 2021-12-01 NOTE — Plan of Care (Signed)
  Problem: Consults Goal: RH GENERAL PATIENT EDUCATION Description: See Patient Education module for education specifics. Outcome: Progressing   Problem: RH BOWEL ELIMINATION Goal: RH STG MANAGE BOWEL WITH ASSISTANCE Description: STG Manage Bowel with mod I  Assistance. Outcome: Progressing Goal: RH STG MANAGE BOWEL W/MEDICATION W/ASSISTANCE Description: STG Manage Bowel with Medication with  mod I Assistance. Outcome: Progressing   Problem: RH SAFETY Goal: RH STG ADHERE TO SAFETY PRECAUTIONS W/ASSISTANCE/DEVICE Description: STG Adhere to Safety Precautions With cues Assistance/Device. Outcome: Progressing   Problem: RH PAIN MANAGEMENT Goal: RH STG PAIN MANAGED AT OR BELOW PT'S PAIN GOAL Description: Pain managed at or below level 4 with prns/scheduled meds Outcome: Progressing   Problem: RH KNOWLEDGE DEFICIT GENERAL Goal: RH STG INCREASE KNOWLEDGE OF SELF CARE AFTER HOSPITALIZATION Description: Patient will be able to manage care at discharge using handouts and educational resources with cues/reminders  Outcome: Progressing   Problem: RH KNOWLEDGE DEFICIT Goal: RH STG INCREASE KNOWLEDGE OF DIABETES Description: Patient will be able to manage DM care, medications and dietary modifications at discharge using handouts and educational resources with cues/reminders  Outcome: Progressing Goal: RH STG INCREASE KNOWLEDGE OF HYPERTENSION Description: Patient will be able to manage HTN with medications and dietary modifications at discharge using handouts and educational resources with cues/reminders  Outcome: Progressing Goal: RH STG INCREASE KNOWLEGDE OF HYPERLIPIDEMIA Description: Patient will be able to manage HLD, medications and dietary modifications at discharge using handouts and educational resources with cues/reminders  Outcome: Progressing

## 2021-12-02 LAB — GLUCOSE, CAPILLARY
Glucose-Capillary: 116 mg/dL — ABNORMAL HIGH (ref 70–99)
Glucose-Capillary: 183 mg/dL — ABNORMAL HIGH (ref 70–99)
Glucose-Capillary: 110 mg/dL — ABNORMAL HIGH (ref 70–99)
Glucose-Capillary: 136 mg/dL — ABNORMAL HIGH (ref 70–99)

## 2021-12-02 NOTE — Plan of Care (Signed)
  Problem: Consults Goal: RH GENERAL PATIENT EDUCATION Description: See Patient Education module for education specifics. Outcome: Progressing   Problem: RH BOWEL ELIMINATION Goal: RH STG MANAGE BOWEL WITH ASSISTANCE Description: STG Manage Bowel with mod I  Assistance. Outcome: Progressing Goal: RH STG MANAGE BOWEL W/MEDICATION W/ASSISTANCE Description: STG Manage Bowel with Medication with  mod I Assistance. Outcome: Progressing   Problem: RH SAFETY Goal: RH STG ADHERE TO SAFETY PRECAUTIONS W/ASSISTANCE/DEVICE Description: STG Adhere to Safety Precautions With cues Assistance/Device. Outcome: Progressing   Problem: RH PAIN MANAGEMENT Goal: RH STG PAIN MANAGED AT OR BELOW PT'S PAIN GOAL Description: Pain managed at or below level 4 with prns/scheduled meds Outcome: Progressing   Problem: RH KNOWLEDGE DEFICIT GENERAL Goal: RH STG INCREASE KNOWLEDGE OF SELF CARE AFTER HOSPITALIZATION Description: Patient will be able to manage care at discharge using handouts and educational resources with cues/reminders  Outcome: Progressing   Problem: RH KNOWLEDGE DEFICIT Goal: RH STG INCREASE KNOWLEDGE OF DIABETES Description: Patient will be able to manage DM care, medications and dietary modifications at discharge using handouts and educational resources with cues/reminders  Outcome: Progressing Goal: RH STG INCREASE KNOWLEDGE OF HYPERTENSION Description: Patient will be able to manage HTN with medications and dietary modifications at discharge using handouts and educational resources with cues/reminders  Outcome: Progressing Goal: RH STG INCREASE KNOWLEGDE OF HYPERLIPIDEMIA Description: Patient will be able to manage HLD, medications and dietary modifications at discharge using handouts and educational resources with cues/reminders  Outcome: Progressing

## 2021-12-02 NOTE — Progress Notes (Signed)
Occupational Therapy Session Note  Patient Details  Name: Erika [name] MRN: [id number] Date of Birth: 09-09-1971  Today's Date: 12/02/2021 OT Individual Time: 0900-1000 OT Individual Time Calculation (min): 60 min    Short Term Goals: Week 1:  OT Short Term Goal 1 (Week 1): Patient will don UB clothing with set-up assist with use of hemi technique. OT Short Term Goal 2 (Week 1): Patient will complete toilet transfer via squat-pivot and use of hemi-walker. OT Short Term Goal 3 (Week 1): Patient will complete 1/3 parts of LB dressing with Mod A.  Skilled Therapeutic Interventions/Progress Updates:    Pt received in bed and agreeable to therapy. Declined b/d as she did this yesterday and did not feel the need to get another bath today.  Pt sat to EOB with min a to fully roll onto R side and push up to sit.   At first stand, pt unable to push up stating her legs are weak in the am.  Cued pt to forward wt shift and not give herself an option not to stand, to put in extra effort in the mornings. Pt then able to rise with min and SPT with hemi walker to wc. Then SPT with grab bar >< toilet.  Min A with balance as she managed clothing for toileting.  Worked on soft tissue massage to wrist flexors as wrist ext severely limited. After massage and ROM able to achieve 45 degrees of wrist ext.  Encouraged pt to do self massage each night and self ROM. Applied wrist splint. Pt resting in wc with belt alarm on and all needs met.   Therapy Documentation Precautions:  Precautions Precautions: Fall Precaution Comments: Hx recurrent falls Required Braces or Orthoses: Other Brace Other Brace: CAM boot on LLE Restrictions Weight Bearing Restrictions: No LLE Weight Bearing: Weight bearing as tolerated Other Position/Activity Restrictions: WBAT LLE with air cast or CAM boot?      Pain: C/o chronic L ankle pain    ADL: ADL Eating: Set up Where Assessed-Eating: Bed level Grooming:  Setup Where Assessed-Grooming: Sitting at sink Upper Body Bathing: Minimal assistance Where Assessed-Upper Body Bathing: Sitting at sink Lower Body Bathing: Minimal assistance Where Assessed-Lower Body Bathing: Sitting at sink, Standing at sink Upper Body Dressing: Minimal assistance Where Assessed-Upper Body Dressing: Wheelchair Lower Body Dressing: Moderate assistance Where Assessed-Lower Body Dressing: Wheelchair Toileting: Moderate assistance Where Assessed-Toileting: Glass blower/designer: Psychiatric nurse Method: Stand Ecologist: Grab bars, Raised toilet seat Tub/Shower Transfer: Not assessed   Therapy/Group: Individual Therapy  [place] 12/02/2021, 12:16 PM

## 2021-12-02 NOTE — Progress Notes (Signed)
Physical Therapy Session Note  Patient Details  Name: Erika [name] MRN: [id number] Date of Birth: [birth year]  Today's Date: 12/02/2021 PT Individual Time: 5537-4827; 0786-7544 PT Individual Time Calculation (min): 53 min and 79 min  Short Term Goals: Week 1:  PT Short Term Goal 1 (Week 1): Pt will perform sit <>stand w/mod A and LRAD PT Short Term Goal 2 (Week 1): Pt will perform bed <>chair transfer w/mod A and LRAD PT Short Term Goal 3 (Week 1): Pt will ambulate 20' w/mod A and LRAD  Skilled Therapeutic Interventions/Progress Updates:  Session 1 Pt received seated in WC in room, husband and daughter present. Pt reported 5/10 pain in BLEs and requested pain meds at end of session. Offered repositioning and distraction throughout session for pain modulation. Emphasis of session on family education, as pt's husband presented several concerns and questions to therapist. Husband very stressed regarding pt's current mobility status and verbalized dissatisfaction with the equipment they received following previous CIR stay. Per husband, pt received a WC that was too large for her and unable to fit into their bathroom, resulting in several falls in past month due to pt attempting to ambulate into bathroom. Informed pt and husband that all concerns would be communicated with care team. Husband reports that pt's function has significantly declined in past week and suspects she had another CVA. Per recent MRI, suspect TIAs. Educated pt and husband on deficits to be expected with the location of pt's CVAs and performing squat pivot and slide board transfers moving forward due to pt's fluctuations in mobility and all falls occurring during standing transfers, pt and husband verbalized understanding. Will spend evening session working on transfers for improved independence and safety with transfers at home. Of note, pt's daughter present for session and very emotionally labile throughout, which upset pt  and husband.   Pt requested to get into bed and performed sit <>stand pivot w/max A due to maintained B knee flexion, L hemiplegia and STRONG posterior lean. Sit <> supine w/CGA and pt was left supine in bed, all needs in reach.   Session 2 Pt received supine in bed, reported 7/10 pain in L ankle and was premedicated. Offered positional changes and distraction throughout session for pain modulation. Pt performed supine <>sit EOB w/min A for trunk support and LUE management and performed sit <>stand w/min A to [place]. Pt transported to [facility] in [place] and stand <>sit w/mod A for eccentric control. Pt voided continently, had BM and performed peri care independently. Sit <>stand w/min A to [place], heavy use of RUE. Donned pants w/total A and pt performed hand hygiene to RUE w/min A. Pt transported to [facility] in [place], stand <>sit w/mod A for eccentric control. Pt self-propelled >200' from room to main gym using hemi-technique w/S*, noted L inattention and frequent running into obstacles on L side.  Min verbal cues for attention to L side.   Lateral board transfer practice from [facility] to mat table on R side w/min-mod A, total A for set-up. Pt demonstrated adequate lateral lean and proper hand placement for transfer, required blocking of LLE, management of LUE and repositioning of B feet. Attempted board transfer to L side, but pt demonstrated extensor tone and pushed herself forward off of board, requiring total A to safely make it to [place]. Lengthy discussion regarding L side inattention and impaired body awareness, pt verbalized understanding.   In // bars for improved BLE strength and gait training: -Sit <>stand x2 w/min A for LUE  management and L knee block. Pt demonstrates heavy reliance on RUE to pull-up to stand w/posterior lean despite repetitive cues for anterior weight shift. Poor recall from previous sessions. -Pt ambulated 8' w/mod A for tactile cues for weightshift, LUE management, midline orientation and  posterior bias correction. Pt demonstrates poor quad control bilaterally (L >R), decreased weightshift to L, R trunk lean and L toe out. Stand <>sit w/min-mod A for eccentric control. Educated pt on importance of lowering to sit controlled for improved BLE strength and reduced risk of injury, pt verbalized understanding.   Pt transported back to room w/total A for time management and performed sit <>stand pivot from [facility] to bed w/total A and no AD, as pt unable to come to full stand or weight shift 2/2 fatigue. Sit <>supine w/ min A and removed CAM boot w/total A. Pt was left supine in bed, LUE and BLEs elevated, ice pack on L ankle and all needs in reach.   Therapy Documentation Precautions:  Precautions Precautions: Fall Precaution Comments: Hx recurrent falls Required Braces or Orthoses: Other Brace Other Brace: CAM boot on LLE Restrictions Weight Bearing Restrictions: No LLE Weight Bearing: Weight bearing as tolerated Other Position/Activity Restrictions: WBAT LLE with air cast or CAM boot?   Therapy/Group: Individual Therapy Cruzita Lederer Josuha Fontanez, PT, DPT  12/02/2021, 7:52 AM

## 2021-12-03 LAB — GLUCOSE, CAPILLARY
Glucose-Capillary: 113 mg/dL — ABNORMAL HIGH (ref 70–99)
Glucose-Capillary: 185 mg/dL — ABNORMAL HIGH (ref 70–99)
Glucose-Capillary: 64 mg/dL — ABNORMAL LOW (ref 70–99)
Glucose-Capillary: 94 mg/dL (ref 70–99)

## 2021-12-03 NOTE — Progress Notes (Signed)
PROGRESS NOTE   Subjective/Complaints:  Erika [name] reports a little dizzy this AM after pain meds- pain controlled-brings pain to 2-3/10 with meds.  was 8/10 yesterday- Ate 100% breakfast tray.   LBM yesterday.    ROS:  Erika [name] denies SOB, abd pain, CP, N/V/C/D, and vision changes  Objective:   No results found. No results for input(s): WBC, HGB, HCT, PLT in the last 72 hours.  No results for input(s): NA, K, CL, CO2, GLUCOSE, BUN, CREATININE, CALCIUM in the last 72 hours.   Intake/Output Summary (Last 24 hours) at 12/03/2021 0916 Last data filed at 12/02/2021 1802 Gross per 24 hour  Intake 480 ml  Output --  Net 480 ml        Physical Exam: Vital Signs Blood pressure (!) 149/84, pulse 84, temperature 97.9 F (36.6 C), temperature source Oral, resp. rate 18, height 5\' 7"  (1.702 m), weight 98.9 kg, SpO2 (!) 89 %.    General: awake, alert, appropriate, sitting up in bed finished breakfast; NAD HENT: conjugate gaze; oropharynx moist CV: regular rate; no JVD Pulmonary: CTA B/L; no W/R/R- good air movement GI: soft, NT, ND, (+)BS Psychiatric: appropriate Neurological: alert  Neurologic: Cranial nerves II through XII intact, motor strength is 5/5 in RIght and 3-/5 left deltoid, bicep, tricep, grip, hip flexor, knee extensors, ankle dorsiflexor and plantar flexor Sensory exam normal sensation to light touch and proprioception in bilateral upper and lower extremities Tone- increased Left finger and wrist flexor tone, and left hip adductor tone  Musculoskeletal: Left foot tenderness.    Assessment/Plan: 1. Functional deficits which require 3+ hours per day of interdisciplinary therapy in a comprehensive inpatient rehab setting. Physiatrist is providing close team supervision and 24 hour management of active medical problems listed below. Physiatrist and rehab team continue to assess barriers to discharge/monitor patient  progress toward functional and medical goals  Care Tool:  Bathing  Bathing activity did not occur: Refused Body parts bathed by patient: Chest, Abdomen, Front perineal area, Face, Left lower leg, Right lower leg, Left upper leg, Right upper leg, Left arm   Body parts bathed by helper: Right arm, Buttocks     Bathing assist Assist Level: Minimal Assistance - Patient > 75%     Upper Body Dressing/Undressing Upper body dressing Upper body dressing/undressing activity did not occur (including orthotics): Refused What is the patient wearing?: Pull over shirt    Upper body assist Assist Level: Minimal Assistance - Patient > 75%    Lower Body Dressing/Undressing Lower body dressing    Lower body dressing activity did not occur: Refused What is the patient wearing?: Pants     Lower body assist Assist for lower body dressing: Moderate Assistance - Patient 50 - 74%     Toileting Toileting    Toileting assist Assist for toileting: Moderate Assistance - Patient 50 - 74%     Transfers Chair/bed transfer  Transfers assist     Chair/bed transfer assist level: Minimal Assistance - Patient > 75%     Locomotion Ambulation   Ambulation assist   Ambulation activity did not occur: Safety/medical concerns (Significant BLE weakness, fear-avoidance behavior, L hemi and ankle fx)  Assist level: Minimal  Assistance - Patient > 75% Assistive device: Walker-hemi Max distance: 31'   Walk 10 feet activity   Assist  Walk 10 feet activity did not occur: Safety/medical concerns (Significant BLE weakness, fear-avoidance behavior, L hemi and ankle fx)  Assist level: Minimal Assistance - Patient > 75% Assistive device: Walker-hemi   Walk 50 feet activity   Assist Walk 50 feet with 2 turns activity did not occur: Safety/medical concerns (Significant BLE weakness, fear-avoidance behavior, L hemi and ankle fx)         Walk 150 feet activity   Assist Walk 150 feet activity did  not occur: Safety/medical concerns (Significant BLE weakness, fear-avoidance behavior, L hemi and ankle fx)         Walk 10 feet on uneven surface  activity   Assist Walk 10 feet on uneven surfaces activity did not occur: Safety/medical concerns (Significant BLE weakness, fear-avoidance behavior, L hemi and ankle fx)         Wheelchair     Assist Is the patient using a wheelchair?: Yes Type of Wheelchair: Manual    Wheelchair assist level: Supervision/Verbal cueing, Set up assist Max wheelchair distance: >200'    Wheelchair 50 feet with 2 turns activity    Assist        Assist Level: Supervision/Verbal cueing   Wheelchair 150 feet activity     Assist      Assist Level: Supervision/Verbal cueing   Blood pressure (!) 149/84, pulse 84, temperature 97.9 F (36.6 C), temperature source Oral, resp. rate 18, height 5\' 7"  (1.702 m), weight 98.9 kg, SpO2 (!) 89 %.    Medical Problem List and Plan: 1.  Debility functional deficits secondary to acute hypoxic respiratory failure/community-acquired pneumonia.  Antibiotic therapy completed.  Wean oxygen to room air             -patient may  shower             -ELOS/Goals: 9-12d, sup/minA goals  Continue CIR- Erika [name], OT and SLP 2.  Impaired mobility, lateral steps: continue Lovenox             -antiplatelet therapy: Aspirin 81 mg daily, Plavix 75 mg daily 3. Pain Management/chronic pain: continue Lyrica 225 mg twice daily, hydrocodone as needed, Flexeril 10 mg nightly, Advil as needed  12/11- pain controlled- con't regimen 4. Mood/PTSD: Continue Lexapro 20 mg daily, Lamictal 200 mg nightly, Thorazine 100 mg nightly, Cogentin 0.5 mg nightly, Klonopin 0.5 mg twice daily             -antipsychotic agents: [name] 5. Neuropsych: This patient is capable of making decisions on her own behalf. 6. Skin/Wound Care: Routine skin checks 7. Fluids/Electrolytes/Nutrition: Routine in and outs with follow-up chemistries 8.  Avulsion  fracture medial malleolus left foot status post fall.  [facility] care orthopedic service Dr. Roland Rack.  Aircast as needed. CAM walker ordered. Weightbearing as tolerated 9.  History of right pontine infarction.  Patient received CIR July 2022.  Continue aspirin and Plavix 10.  CAD/CABG.  No chest pain or shortness of breath.  Continue Plavix/aspirin 11.  Hypertension.  Elevated, but below goal for her given her ICA stenosis- continue Coreg 6.25 mg twice daily, Imdur 60 mg twice daily, Aldactone 25 mg daily, Demadex 10 mg daily.  Monitor with increased mobility  41/32- BP 440 systolic- qwill not change meds due to ICA stenosis.  12.  Diabetes mellitus.  Hemoglobin A1c 7.0.  Semglee 54 units daily.  Check blood sugars before meals and  at bedtime  12/11- CBGs 110-183- usually under 150- con't regimen 13.  Diastolic congestive heart failure.  Continue Demadex 10 mg daily.  Monitor for any signs of fluid overload 14.  Hyperlipidemia.  Crestor/Zetia 15.  GERD.  Continue Protonix 16.  Constipation.  Continue MiraLAX twice daily, Senokot S2 tablets twice daily  12/11- bowels going QOD- con't regimen 17.  Spasticity related to CVA , may benefit from Botox as Outpt. Increase baclofen to 10mg  HS   LOS: 6 days A FACE TO FACE EVALUATION WAS PERFORMED  [name] 12/03/2021, 9:16 AM

## 2021-12-03 NOTE — Plan of Care (Signed)
  Problem: Consults Goal: RH GENERAL PATIENT EDUCATION Description: See Patient Education module for education specifics. Outcome: Progressing   Problem: RH BOWEL ELIMINATION Goal: RH STG MANAGE BOWEL WITH ASSISTANCE Description: STG Manage Bowel with mod I  Assistance. Outcome: Progressing Goal: RH STG MANAGE BOWEL W/MEDICATION W/ASSISTANCE Description: STG Manage Bowel with Medication with  mod I Assistance. Outcome: Progressing   Problem: RH SAFETY Goal: RH STG ADHERE TO SAFETY PRECAUTIONS W/ASSISTANCE/DEVICE Description: STG Adhere to Safety Precautions With cues Assistance/Device. Outcome: Progressing   Problem: RH PAIN MANAGEMENT Goal: RH STG PAIN MANAGED AT OR BELOW PT'S PAIN GOAL Description: Pain managed at or below level 4 with prns/scheduled meds Outcome: Progressing   Problem: RH KNOWLEDGE DEFICIT GENERAL Goal: RH STG INCREASE KNOWLEDGE OF SELF CARE AFTER HOSPITALIZATION Description: Patient will be able to manage care at discharge using handouts and educational resources with cues/reminders  Outcome: Progressing   Problem: RH KNOWLEDGE DEFICIT Goal: RH STG INCREASE KNOWLEDGE OF DIABETES Description: Patient will be able to manage DM care, medications and dietary modifications at discharge using handouts and educational resources with cues/reminders  Outcome: Progressing Goal: RH STG INCREASE KNOWLEDGE OF HYPERTENSION Description: Patient will be able to manage HTN with medications and dietary modifications at discharge using handouts and educational resources with cues/reminders  Outcome: Progressing Goal: RH STG INCREASE KNOWLEGDE OF HYPERLIPIDEMIA Description: Patient will be able to manage HLD, medications and dietary modifications at discharge using handouts and educational resources with cues/reminders  Outcome: Progressing

## 2021-12-04 LAB — GLUCOSE, CAPILLARY
Glucose-Capillary: 92 mg/dL (ref 70–99)
Glucose-Capillary: 94 mg/dL (ref 70–99)
Glucose-Capillary: 123 mg/dL — ABNORMAL HIGH (ref 70–99)
Glucose-Capillary: 100 mg/dL — ABNORMAL HIGH (ref 70–99)
Glucose-Capillary: 137 mg/dL — ABNORMAL HIGH (ref 70–99)

## 2021-12-04 LAB — CREATININE, SERUM
Creatinine, Ser: 0.97 mg/dL (ref 0.44–1.00)
GFR, Estimated: >60 mL/min (ref >60)

## 2021-12-04 MED ORDER — INSULIN GLARGINE-YFGN 100 UNIT/ML ~~LOC~~ SOLN
50.0000 [IU] | Freq: Every day | SUBCUTANEOUS | Status: DC
  Administered 2021-12-05 – 2021-12-08 (×4): 50 [IU] via SUBCUTANEOUS
  Filled 2021-12-04 (×5): qty 0.5

## 2021-12-04 NOTE — Progress Notes (Signed)
Occupational Therapy Session Note  Patient Details  Name: Erika [name] MRN: [id number] Date of Birth: 03/07/1971  Today's Date: 12/04/2021 OT Individual Time: 1100-1200 OT Individual Time Calculation (min): 60 min    Short Term Goals: Week 1:  OT Short Term Goal 1 (Week 1): Patient will don UB clothing with set-up assist with use of hemi technique. OT Short Term Goal 2 (Week 1): Patient will complete toilet transfer via squat-pivot and use of hemi-walker. OT Short Term Goal 3 (Week 1): Patient will complete 1/3 parts of LB dressing with Mod A.  Skilled Therapeutic Interventions/Progress Updates:    Pt seen this session to focus on safe mobility skills.  Pt received in wc and said her Blood sugar was low and nursing wanted her to eat fig newtons but she was unable to get them open by herself. Opened the cookies so she could eat them. During this time, discussed use of the slide board. Pt felt it was not practical at home as her bed is so much higher than her wc seat. She does not think she wants to use it to the [facility]. Discussed concerns of getting into the bathroom that her husband had with her current wc. Pt stated that her bathroom is so small that even a narrow transport chair would not work as she would have not space to turn around.   Pt taken to gym to work on mobility. Spent a great deal of time working on techniques for sit to stand from w/c and from mat.  Had pt move hips closer to the edge of chair for increased weight bearing through feet to avoid turn out of L leg into ext rot.  Then had pt use small ball between thighs and she had to squeeze the ball as she rose to stand to activate inner thigh adductors.  From stand, she focused on small steps for controlled stand pivots and then really leaning forward for controlled descent.  Pt did quite well today but would often say she felt her R leg was going to give out on her.  Spent quite a bit of time discussing the need to focus on  using her muscles of unaffected R leg and not allowing that leg to "give out" as she does not have any neurological impairments on that side. Discussed the mind body connection.   Pt returned to room, did well with stand pivot to bed using techniques utilized in gym.. min A to move to supine. Pt in bed with all needs met. Alarm set.   Therapy Documentation Precautions:  Precautions Precautions: Fall Precaution Comments: Hx recurrent falls Required Braces or Orthoses: Other Brace Other Brace: CAM boot on LLE Restrictions Weight Bearing Restrictions: No LLE Weight Bearing: Weight bearing as tolerated Other Position/Activity Restrictions: WBAT LLE with air cast or CAM boot?     Pain: c/o R knee pain from over use   ADL: ADL Eating: Set up Where Assessed-Eating: Bed level Grooming: Setup Where Assessed-Grooming: Sitting at sink Upper Body Bathing: Minimal assistance Where Assessed-Upper Body Bathing: Sitting at sink Lower Body Bathing: Minimal assistance Where Assessed-Lower Body Bathing: Sitting at sink, Standing at sink Upper Body Dressing: Minimal assistance Where Assessed-Upper Body Dressing: Wheelchair Lower Body Dressing: Moderate assistance Where Assessed-Lower Body Dressing: Wheelchair Toileting: Moderate assistance Where Assessed-Toileting: Glass blower/designer: Psychiatric nurse Method: Stand Ecologist: Grab bars, Raised toilet seat Tub/Shower Transfer: Not assessed    Therapy/Group: Individual Therapy  [place] 12/04/2021, 12:36 PM

## 2021-12-04 NOTE — Progress Notes (Signed)
Occupational Therapy Session Note  Patient Details  Name: Erika [name] MRN: [id number] Date of Birth: 25-Oct-1971  Today's Date: 12/04/2021 OT Individual Time: 3716-9678 OT Individual Time Calculation (min): 25 min    Short Term Goals: Week 1:  OT Short Term Goal 1 (Week 1): Patient will don UB clothing with set-up assist with use of hemi technique. OT Short Term Goal 2 (Week 1): Patient will complete toilet transfer via squat-pivot and use of hemi-walker. OT Short Term Goal 3 (Week 1): Patient will complete 1/3 parts of LB dressing with Mod A.   Skilled Therapeutic Interventions/Progress Updates:    Pt greeted at time of session semireclined in bed resting agreeable to OT session, no pain reported at rest but did have "some pain" in LLE in standing later in session but still able to participate. Supine > sit Supervision with extended time and interlocking ankles, CAM boot already on from previous sessions. Sit > stands x5 from high, medium, and low bed height fluctuating from Supervision  to Min A respectively. Physical assist to maintain LLE alignment and prevent external rotation but pt states this makes her R knee hurt from compensating. Attempted 1 set of squats with physical demonstration for form but pt unable to perform and slowly sitting on EOB instead. Side stepping > HOB with CGA and sit > supine Min A. Alarm on call bell in reach.   Therapy Documentation Precautions:  Precautions Precautions: Fall Precaution Comments: Hx recurrent falls Required Braces or Orthoses: Other Brace Other Brace: CAM boot on LLE Restrictions Weight Bearing Restrictions: No LLE Weight Bearing: Weight bearing as tolerated Other Position/Activity Restrictions: WBAT LLE with air cast or CAM boot?     Therapy/Group: Individual Therapy  Viona Gilmore 12/04/2021, 7:30 AM

## 2021-12-04 NOTE — Progress Notes (Signed)
PROGRESS NOTE   Subjective/Complaints: No pain c/os   ROS:  Pt denies SOB, abd pain, CP, N/V/C/D, and vision changes  Objective:   No results found. No results for input(s): WBC, HGB, HCT, PLT in the last 72 hours.  Recent Labs    12/04/21 0633  CREATININE 0.97     Intake/Output Summary (Last 24 hours) at 12/04/2021 5170 Last data filed at 12/03/2021 1300 Gross per 24 hour  Intake 340 ml  Output --  Net 340 ml         Physical Exam: Vital Signs Blood pressure 132/72, pulse 67, temperature (!) 97.5 F (36.4 C), temperature source Oral, resp. rate 18, height 5\' 7"  (1.702 m), weight 98.9 kg, SpO2 92 %.   General: No acute distress Mood and affect are appropriate Heart: Regular rate and rhythm no rubs murmurs or extra sounds Lungs: Clear to auscultation, breathing unlabored, no rales or wheezes Abdomen: Positive bowel sounds, soft nontender to palpation, nondistended, + BS soft , mild RLQ echhymosis Extremities: No clubbing, cyanosis, or edema Skin: No evidence of breakdown, no evidence of rash    Neurologic: Cranial nerves II through XII intact, motor strength is 5/5 in RIght and 3-/5 left deltoid, bicep, tricep, grip, hip flexor, knee extensors, ankle dorsiflexor and plantar flexor Sensory exam normal sensation to light touch and proprioception in bilateral upper and lower extremities Tone- increased Left finger and wrist flexor tone, and left hip adductor tone  Musculoskeletal: Left foot tenderness.    Assessment/Plan: 1. Functional deficits which require 3+ hours per day of interdisciplinary therapy in a comprehensive inpatient rehab setting. Physiatrist is providing close team supervision and 24 hour management of active medical problems listed below. Physiatrist and rehab team continue to assess barriers to discharge/monitor patient progress toward functional and medical goals  Care  Tool:  Bathing  Bathing activity did not occur: Refused Body parts bathed by patient: Chest, Abdomen, Front perineal area, Face, Left lower leg, Right lower leg, Left upper leg, Right upper leg, Left arm   Body parts bathed by helper: Right arm, Buttocks     Bathing assist Assist Level: Minimal Assistance - Patient > 75%     Upper Body Dressing/Undressing Upper body dressing Upper body dressing/undressing activity did not occur (including orthotics): Refused What is the patient wearing?: Pull over shirt    Upper body assist Assist Level: Minimal Assistance - Patient > 75%    Lower Body Dressing/Undressing Lower body dressing    Lower body dressing activity did not occur: Refused What is the patient wearing?: Pants     Lower body assist Assist for lower body dressing: Moderate Assistance - Patient 50 - 74%     Toileting Toileting    Toileting assist Assist for toileting: Moderate Assistance - Patient 50 - 74%     Transfers Chair/bed transfer  Transfers assist     Chair/bed transfer assist level: Minimal Assistance - Patient > 75%     Locomotion Ambulation   Ambulation assist   Ambulation activity did not occur: Safety/medical concerns (Significant BLE weakness, fear-avoidance behavior, L hemi and ankle fx)  Assist level: Minimal Assistance - Patient > 75% Assistive device: Lurene Shadow  distance: 68'   Walk 10 feet activity   Assist  Walk 10 feet activity did not occur: Safety/medical concerns (Significant BLE weakness, fear-avoidance behavior, L hemi and ankle fx)  Assist level: Minimal Assistance - Patient > 75% Assistive device: Walker-hemi   Walk 50 feet activity   Assist Walk 50 feet with 2 turns activity did not occur: Safety/medical concerns (Significant BLE weakness, fear-avoidance behavior, L hemi and ankle fx)         Walk 150 feet activity   Assist Walk 150 feet activity did not occur: Safety/medical concerns (Significant BLE  weakness, fear-avoidance behavior, L hemi and ankle fx)         Walk 10 feet on uneven surface  activity   Assist Walk 10 feet on uneven surfaces activity did not occur: Safety/medical concerns (Significant BLE weakness, fear-avoidance behavior, L hemi and ankle fx)         Wheelchair     Assist Is the patient using a wheelchair?: Yes Type of Wheelchair: Manual    Wheelchair assist level: Supervision/Verbal cueing, Set up assist Max wheelchair distance: >200'    Wheelchair 50 feet with 2 turns activity    Assist        Assist Level: Supervision/Verbal cueing   Wheelchair 150 feet activity     Assist      Assist Level: Supervision/Verbal cueing   Blood pressure 132/72, pulse 67, temperature (!) 97.5 F (36.4 C), temperature source Oral, resp. rate 18, height 5\' 7"  (1.702 m), weight 98.9 kg, SpO2 92 %.    Medical Problem List and Plan: 1.  Debility functional deficits secondary to acute hypoxic respiratory failure/community-acquired pneumonia.  Antibiotic therapy completed.  Wean oxygen to room air             -patient may  shower             -ELOS/Goals: 9-12d, sup/minA goals, ELOS 12/19  Continue CIR- PT, OT and SLP 2.  Impaired mobility, lateral steps: continue Lovenox, amb distance 8' recorded in last PT note , no sig bruising in abd area              -antiplatelet therapy: Aspirin 81 mg daily, Plavix 75 mg daily 3. Pain Management/chronic pain: continue Lyrica 225 mg twice daily, hydrocodone as needed, Flexeril 10 mg nightly, Advil as needed  12/11- pain controlled- con't regimen 4. Mood/PTSD: Continue Lexapro 20 mg daily, Lamictal 200 mg nightly, Thorazine 100 mg nightly, Cogentin 0.5 mg nightly, Klonopin 0.5 mg twice daily             -antipsychotic agents: [name] 5. Neuropsych: This patient is capable of making decisions on her own behalf. 6. Skin/Wound Care: Routine skin checks 7. Fluids/Electrolytes/Nutrition: Routine in and outs with follow-up  chemistries 8.  Avulsion fracture medial malleolus left foot status post fall.  [facility] service Dr. Roland Rack.  Aircast as needed. CAM walker ordered. Weightbearing as tolerated 9.  History of right pontine infarction.  Patient received CIR July 2022.  Continue aspirin and Plavix 10.  CAD/CABG.  No chest pain or shortness of breath.  Continue Plavix/aspirin 11.  Hypertension.  Elevated, but below goal for her given her ICA stenosis- continue Coreg 6.25 mg twice daily, Imdur 60 mg twice daily, Aldactone 25 mg daily, Demadex 10 mg daily.  Monitor with increased mobility  60/73- BP 710 systolic- qwill not change meds due to ICA stenosis.  12.  Diabetes mellitus.  Hemoglobin A1c 7.0.  Semglee 54 units  daily.  Check blood sugars before meals and at bedtime  12/11- CBGs 110-183- usually under 150- con't regimen CBG (last 3)  Recent Labs    12/03/21 2144 12/03/21 2230 12/04/21 0544  GLUCAP 64* 94 94   Reduce semglee to 50U 13.  Diastolic congestive heart failure.  Continue Demadex 10 mg daily.  Monitor for any signs of fluid overload 14.  Hyperlipidemia.  Crestor/Zetia 15.  GERD.  Continue Protonix 16.  Constipation.  Continue MiraLAX twice daily, Senokot S2 tablets twice daily  12/11- bowels going QOD- con't regimen 17.  Spasticity related to CVA , may benefit from Botox as Outpt. Increase baclofen to 10mg  HS   LOS: 7 days A FACE TO FACE EVALUATION WAS PERFORMED  Charlett Blake 12/04/2021, 8:21 AM

## 2021-12-04 NOTE — Progress Notes (Addendum)
Patient ID: Erika [name], female   DOB: 06/29/1971, 50 y.o.   MRN: [id number] Have reached out to [place] regarding any updates on insurance coverage. Awaiting response  12;29 PM No news according to [name]

## 2021-12-04 NOTE — Progress Notes (Signed)
Hypoglycemic Event  CBG: 64  Treatment: 4 oz juice/soda  Symptoms: None  Follow-up CBG: Time: 2030 CBG Result:94  Possible Reasons for Event: Unknown  Comments/MD notified: patient ate crackers, and drink orange juice. Patient asymptomatic     [facility]

## 2021-12-04 NOTE — Progress Notes (Signed)
Physical Therapy Session Note  Patient Details  Name: BIANCO CANGE MRN: [id number] Date of Birth: 01/27/1971  Today's Date: 12/04/2021 PT Individual Time: 2992-4268 PT Individual Time Calculation (min): 60 min   Short Term Goals: Week 1:  PT Short Term Goal 1 (Week 1): Pt will perform sit <>stand w/mod A and LRAD PT Short Term Goal 2 (Week 1): Pt will perform bed <>chair transfer w/mod A and LRAD PT Short Term Goal 3 (Week 1): Pt will ambulate 20' w/mod A and LRAD  Skilled Therapeutic Interventions/Progress Updates:    Pt received seated in bed, agreeable to PT session. Pt reports some pain in her R knee and L ankle, not rated, premedicated prior to start of therapy session. Supine to sit with CGA for some trunk control. Pt reports urge to urinate. Stedy transfer to bathroom for time conservation. Sit to stand with min A to stedy. Pt requires assist for some clothing management, setup A for pericare. Stedy transfer toilet to w/c. Dependent transport via w/c to/from therapy gym for time and energy conservation. Slide board transfer w/c to mat table initially with mod to max A to the R from w/c to mat. Pt progresses to min A for slide board transfers and does better transferring to her L side with better ability to maintain head/hips relationship this direction. Pt then agreeable to attempt stand pivot transfers with HW. Pt initially mod A for SPT with HW, progresses to min A. Pt does report difficulty with standing and WBing on LLE due to pain. Pt requests to return to bed at end of session. Stand pivot transfer back to bed with mod A and HW. Sit to supine min A for some LLE management. Pt left seated in bed with needs in reach, bed alarm in place.  Therapy Documentation Precautions:  Precautions Precautions: Fall Precaution Comments: Hx recurrent falls Required Braces or Orthoses: Other Brace Other Brace: CAM boot on LLE Restrictions Weight Bearing Restrictions: No LLE Weight  Bearing: Weight bearing as tolerated Other Position/Activity Restrictions: WBAT LLE with air cast or CAM boot?     Therapy/Group: Individual Therapy   Excell Seltzer, PT, DPT, CSRS  12/04/2021, 12:35 PM

## 2021-12-04 NOTE — Progress Notes (Signed)
Occupational Therapy Session Note  Patient Details  Name: Erika [name] MRN: [id number] Date of Birth: 06-29-1971  Today's Date: 12/04/2021 OT Individual Time: 1020-1050 OT Individual Time Calculation (min): 30 min    Short Term Goals: Week 1:  OT Short Term Goal 1 (Week 1): Patient will don UB clothing with set-up assist with use of hemi technique. OT Short Term Goal 2 (Week 1): Patient will complete toilet transfer via squat-pivot and use of hemi-walker. OT Short Term Goal 3 (Week 1): Patient will complete 1/3 parts of LB dressing with Mod A.  Skilled Therapeutic Interventions/Progress Updates:  Skilled OT intervention completed with focus on ADL retraining, education on donning CAM boot for dressing independence, and functional transfers. Pt received supine in bed, agreeable to session. Able to complete bed mobility with min A for sitting EOB, with cues needed to bring LUE with her. Pt attempting to donn CAM boot with difficulty while sitting EOB and leaning forward. Educated on ease of access by having leg up on surface like bed or propped on w/c if brakes locked for increased independence. Min A needed to prop LLE on w/c in front of pt, put foot in boot with total A, then pt participated in looping the straps with RUE, requiring occasional min A due to LLE external rotation and difficulty feeding strap through the loop. Pt with c/o difficulty getting velcro pieces unconnected for threading, with education provided on creating small loop at end when doffing the boot for increased efficiency with donning at the necessary time. Completed sit > stand using hemi walker with mod A and bed elevated with pt attempting to pivot, but unable to pivot LLE. Transitioned to slideboard method for transfer, with Max A needed for placement of board due to being on pt's L side, then min A for slideboard to w/c > L side, with LLE placement needed throughout. Pt completed grooming tasks seated at sink with  supervision. Pt left seated in w/c, with chair alarm on, and all needs in reach at end of session.  Therapy Documentation Precautions:  Precautions Precautions: Fall Precaution Comments: Hx recurrent falls Required Braces or Orthoses: Other Brace Other Brace: CAM boot on LLE Restrictions Weight Bearing Restrictions: No LLE Weight Bearing: Weight bearing as tolerated Other Position/Activity Restrictions: WBAT LLE with air cast or CAM boot?  Pain: No c/o pain   Therapy/Group: Individual Therapy  Jaykwon Morones E Kajah Santizo 12/04/2021, 7:45 AM

## 2021-12-05 ENCOUNTER — Encounter: Payer: Managed Care, Other (non HMO) | Admitting: Speech Pathology

## 2021-12-05 ENCOUNTER — Ambulatory Visit: Payer: Managed Care, Other (non HMO)

## 2021-12-05 LAB — GLUCOSE, CAPILLARY
Glucose-Capillary: 71 mg/dL (ref 70–99)
Glucose-Capillary: 87 mg/dL (ref 70–99)
Glucose-Capillary: 139 mg/dL — ABNORMAL HIGH (ref 70–99)
Glucose-Capillary: 103 mg/dL — ABNORMAL HIGH (ref 70–99)

## 2021-12-05 MED ORDER — DICLOFENAC SODIUM 1 % EX GEL
2.0000 g | Freq: Four times a day (QID) | CUTANEOUS | Status: DC
  Administered 2021-12-05 – 2021-12-08 (×13): 2 g via TOPICAL
  Filled 2021-12-05: qty 100

## 2021-12-05 NOTE — Patient Care Conference (Signed)
Inpatient RehabilitationTeam Conference and Plan of Care Update Date: 12/05/2021   Time: 12:10 PM    Patient Name: Erika [name]      Medical Record Number: [id number]  Date of Birth: 02/25/1971 Sex: Female         Room/Bed: 5C15C/5C15C-01 Payor Info: Payor: MEDICARE / Plan: MEDICARE PART A / Product Type: *No Product type* /    Admit Date/Time:  11/27/2021  1:59 PM  Primary Diagnosis:  CAP (community acquired pneumonia)  Hospital Problems: Principal Problem:   CAP (community acquired pneumonia) Active Problems:   Debility    Expected Discharge Date: Expected Discharge Date: 12/11/21  Team Members Present: Physician leading conference: Dr. Courtney Heys Social Worker Present: Ovidio Kin, LCSW Nurse Present: Dorien Chihuahua, RN PT Present: Francena Hanly, PT OT Present: Meriel Pica, OT SLP Present: Weston Anna, SLP PPS Coordinator present : Gunnar Fusi, SLP     Current Status/Progress Goal Weekly Team Focus  Bowel/Bladder     Continent of bowel and bladder   Continent   Toileting  Swallow/Nutrition/ Hydration             ADL's   mod A overall  min A stand balance, min LB self care, min toilet transfer to [facility], min toileting  ADL training, pt/fam education, functional mobility   Mobility   min-mod A sit <>stand depending on fatigue, max-total stand pivot, mod A slide board transfers to R side, gait in // bars w/min-mod A  Min A  Pt/fam edu, L NMR, safety awareness, transfers   Communication   Supervision-Mod I  Mod I  use of word-finding strategies   Safety/Cognition/ Behavioral Observations  Min A  Supervision  recall with use of strategies, attention and problem solving   Pain     Voltaren gel and tylenol prn pain   Pain controlled with prn medications   Monitor need for and effectiveness of medications  Skin     [name]          Discharge Planning:  Pt and husband want her to go to a SNF and have been awaiting insurance auth since last week from  [place]. [facility] working on this.   Team Discussion: BP controlled per MD. Added baclofen at HS. Team notes patient is at baseline functionally with some ankle pain/soreness post fall PTA. Recommended non-ambulatory at last admission/discharge and set up with [facility]. Patient with pre-morbid impulsiveness, poor memory and poor safety awareness.   Patient on target to meet rehab goals: yes, goals set for min assist and patient is currently min - mod assist for self care.  WBAT but needs min - max assist for transfers. Patient has a history of non compliance/unsafe transfers, toileting/showering, etc. Recommendations for safety (no standing), use of a slide-board, and squat pivot transfers have been reviewed with the patient.  *See Care Plan and progress notes for long and short-term goals.   Revisions to Treatment Plan:  Recommend OP Botox treatment   Teaching Needs: Safety, toileting, transfers, medications, dietary modifications, etc.  Current Barriers to Discharge: Decreased caregiver support and Behavior  Possible Resolutions to Barriers: Family education with spouse and daughter     Medical Summary Current Status: Botox as outpatient; continent B/B- no skin issues- Norco for pain; BP 403-474 systolic due to ICA stenosis;  Barriers to Discharge: Decreased family/caregiver support;Insurance for SNF coverage;Home enviroment access/layout;Medical stability;Weight bearing restrictions;Weight  Barriers to Discharge Comments: family wants her to go to SNF- since first day; had fall due to daughter  struggling at home to care for pt. Possible Resolutions to [employer]: at same functional level left here- min A- mod A- goals min A- nonambulatory-ankle sore now; no carryover; sometimes fluctuates functionally- family being unsafe with transfers/was walking pt against advice- cognition the same- poor carryover/poor memory- 12/11/21   Continued Need for Acute Rehabilitation Level of  Care: The patient requires daily medical management by a physician with specialized training in physical medicine and rehabilitation for the following reasons: Direction of a multidisciplinary physical rehabilitation program to maximize functional independence : Yes Medical management of patient stability for increased activity during participation in an intensive rehabilitation regime.: Yes Analysis of laboratory values and/or radiology reports with any subsequent need for medication adjustment and/or medical intervention. : Yes   I attest that I was present, lead the team conference, and concur with the assessment and plan of the team.   Dorien Chihuahua B 12/05/2021, 12:57 PM

## 2021-12-05 NOTE — Progress Notes (Signed)
Physical Therapy Session Note  Patient Details  Name: Erika [name] MRN: [id number] Date of Birth: May 19, 1971  Today's Date: 12/05/2021 PT Individual Time: 1412-1430 PT Individual Time Calculation (min): 18 min   Short Term Goals: Week 2:  PT Short Term Goal 1 (Week 2): Pt will ambulate 20' w/mod A and LRAD PT Short Term Goal 2 (Week 2): Pt will perform sit <>stands w/LRAD and mod A consistently PT Short Term Goal 3 (Week 2): Pt will perform bed <>chair transfers w/LRAD and mod A consistently PT Short Term Goal 4 (Week 2): Pt will perform bed mobility w/CGA for improved independence  Skilled Therapeutic Interventions/Progress Updates:  Pt received supine in bed and agreeable to therapy session. Already wearing L LE CAM boot. Supine>sitting R EOB, HOB elevated and relying on bedrail, with heavy min assist for rotating trunk and bringing it forward. Able to scoot forward to EOB with increased time/effort. Reports need to use bathroom. Sit>stand EOB>stedy with heavy min assist for lifting to stand and manual facilitation for L UE management. Stedy transfer in/out bathroom. Sit<>stand to/from stedy seat with light min/CGA. Standing with CGA during total assist LB clothing management. Continent of bladder and performed seated peri-care without assist. Standing in stedy performed hand hygiene with CGA for balance safety. Pt left seated in w/c with needs in reach, L UE supported on 1/2 lap tray, and seat belt alarm on.  Therapy Documentation Precautions:  Precautions Precautions: Fall Precaution Comments: Hx recurrent falls Required Braces or Orthoses: Other Brace Other Brace: CAM boot on LLE Restrictions Weight Bearing Restrictions: No LLE Weight Bearing: Weight bearing as tolerated Other Position/Activity Restrictions: WBAT LLE with air cast or CAM boot?  Pain: No reports of pain throughout session.    Therapy/Group: Individual Therapy  Tawana Scale , PT, DPT, NCS,  CSRS  12/05/2021, 11:41 AM

## 2021-12-05 NOTE — Progress Notes (Signed)
Patient ID: Erika [name], female   DOB: 10/05/1971, 50 y.o.   MRN: [id number]  Met with pt and spoke with husband via telephone to discuss team conference pt being at the level she was in June when she left here but does not carry over the strategies of transferring and moving. She tends to pull on their daughter which is too much for her. Have been trying to get pt into [facility] since last week and have not heard anything regarding insurance auth for this. Both aware worker text and calls facility twice a day. The concern is the longer she is here the less likely they will cover SNF. Husband has concerns about pt coming home again and requiring the care she requires. He does not feel their daughter can provide this level of care. [name]-husband wants to talk with MD and reached out to MD/PA to call him. Both aware target discharge date is 12/19. Will continue to work on transfer to SNF and home in case does not receive approval. °

## 2021-12-05 NOTE — Progress Notes (Signed)
Physical Therapy Weekly Progress Note  Patient Details  Name: Erika [name] MRN: [id number] Date of Birth: Apr 25, 1971  Beginning of progress report period: November 28, 2021 End of progress report period: December 05, 2021  Today's Date: 12/05/2021 PT Individual Time: 1005-1104 PT Individual Time Calculation (min): 59 min   Patient has met 1 of 3 short term goals. Pt fluctuates in mobility and requires min-max A for sit <>stands and bed <>chair transfers w/HW. Pt demonstrates absent carryover and recall from previous sessions regarding safety awareness and proper transfer technique. Family training has not been completed but will be scheduled prior to DC.   Patient continues to demonstrate the following deficits muscle weakness, decreased cardiorespiratoy endurance, unbalanced muscle activation, ataxia, and decreased coordination, decreased awareness, decreased safety awareness, and decreased memory, and decreased standing balance, decreased postural control, hemiplegia, and decreased balance strategies and therefore will continue to benefit from skilled PT intervention to increase functional independence with mobility.  Patient progressing toward long term goals..  Continue plan of care.  PT Short Term Goals Week 1:  PT Short Term Goal 1 (Week 1): Pt will perform sit <>stand w/mod A and LRAD PT Short Term Goal 1 - Progress (Week 1): Progressing toward goal PT Short Term Goal 2 (Week 1): Pt will perform bed <>chair transfer w/mod A and LRAD PT Short Term Goal 2 - Progress (Week 1): Progressing toward goal PT Short Term Goal 3 (Week 1): Pt will ambulate 20' w/mod A and LRAD PT Short Term Goal 3 - Progress (Week 1): Met Week 2:  PT Short Term Goal 1 (Week 2): Pt will ambulate 20' w/mod A and LRAD PT Short Term Goal 2 (Week 2): Pt will perform sit <>stands w/LRAD and mod A consistently PT Short Term Goal 3 (Week 2): Pt will perform bed <>chair transfers w/LRAD and mod A consistently PT  Short Term Goal 4 (Week 2): Pt will perform bed mobility w/CGA for improved independence  Skilled Therapeutic Interventions/Progress Updates:  Pt received seated in WC in room, reported 8/10 pain in L ankle and was premedicated. Offered repositioning, distraction and change of scenery for pain modulation throughout session. Emphasis of session on gait training and L NMR. Pt self-propelled >300' from room to main gym using hemi technique w/S*.   In // bars for improved BLE strength, gait training and midline orientation: -Sit <>stands x3 w/mod A, RUE support on rail, progressing to min A on last rep for LUE support, anterior weight shift and L knee block  -In standing, noted significant R trunk lean, maintained L knee flexion and anterior rotation of L pelvis that pt was unable to correct w/biofeedback from mirror and mod tactile cues. -Pt ambulated 8' x2 in // bars w/min A and close WC follow. Noted improved L knee extension, lateral weight shift to L and midline orientation as pt walked. Pt demonstrates mild ataxia, antalgic gait pattern and step-to gait pattern. Min tactile cues provided to facilitate weight shift to L side.   Sit <>stand from [facility] w/min A to [facility] and pt ambulated 101' and 50' w/HW and min A for LUE management, close WC follow for safety. Pt demonstrated improved step length and progressed to step-through pattern, improved knee extension of LLE, improved lateral weight shift and midline orientation. Pt transported back to room w/total A for time management and performed sit <>stand pivot w/HW and min A. Stand <>sit to bed w/mod A for eccentric control and pt performed sit <>supine w/S*. Pt was left  supine in bed, all needs in reach.   Therapy Documentation Precautions:  Precautions Precautions: Fall Precaution Comments: Hx recurrent falls Required Braces or Orthoses: Other Brace Other Brace: CAM boot on LLE Restrictions Weight Bearing Restrictions: No LLE Weight Bearing: Weight  bearing as tolerated Other Position/Activity Restrictions: WBAT LLE with air cast or CAM boot?  Therapy/Group: Individual Therapy  Cruzita Lederer Lynnie Koehler, PT, DPT 12/05/2021, 7:44 AM

## 2021-12-05 NOTE — Progress Notes (Signed)
Orthopedic Tech Progress Note Patient Details:  Erika [name] 19-Jan-1971 [id number]  Ortho Devices Type of Ortho Device: Knee Sleeve Ortho Device/Splint Location: RLE Ortho Device/Splint Interventions: Ordered, Application   Post Interventions Patient Tolerated: Well Instructions Provided: Care of device  [name] 12/05/2021, 12:48 PM

## 2021-12-05 NOTE — Progress Notes (Signed)
Occupational Therapy Session Note  Patient Details  Name: Erika [name] MRN: [id number] Date of Birth: [birth year]  Today's Date: 12/05/2021 OT Individual Time: 1450-1530 OT Individual Time Calculation (min): 40 min    Short Term Goals: Week 1:  OT Short Term Goal 1 (Week 1): Patient will don UB clothing with set-up assist with use of hemi technique. OT Short Term Goal 2 (Week 1): Patient will complete toilet transfer via squat-pivot and use of hemi-walker. OT Short Term Goal 3 (Week 1): Patient will complete 1/3 parts of LB dressing with Mod A.  Skilled Therapeutic Interventions/Progress Updates:  Skilled OT intervention completed with focus on activity tolerance. Pt received seated in w/c, finishing her snack, agreeable to session. Transported pt with total A for energy conservation to therapy gym. Completed sit > stand using hemi-walker with mod A, then L arm placed on table top for weight bearing and balance during connect 4 game to promote dynamic balance and standing tolerance needed for ADL management. Pt able to play one full round in standing with min-mod A needed for balance, assist for maintaining L hand on table and cues needed for promoting strong BOS. Pt with 1 small LOB with pt able to self-correct. Educated on technique to balance in standing with one hand, by using counter space for support and walker in between placing of pieces. Required intermittent seated rest breaks due to fatigue. Transported with total A back to room. Pt left seated in w/c, with rest of snack, chair alarm on and all needs in reach at end of session.  Therapy Documentation Precautions:  Precautions Precautions: Fall Precaution Comments: Hx recurrent falls Required Braces or Orthoses: Other Brace Other Brace: CAM boot on LLE Restrictions Weight Bearing Restrictions: No LLE Weight Bearing: Weight bearing as tolerated Other Position/Activity Restrictions: WBAT LLE with air cast or CAM  boot?  Pain: No c/o pain   Therapy/Group: Individual Therapy  Thamas Appleyard E Linh Johannes 12/05/2021, 8:01 AM

## 2021-12-05 NOTE — Progress Notes (Signed)
Occupational Therapy Weekly Progress Note  Patient Details  Name: Erika [name] MRN: [id number] Date of Birth: 07/28/1971  Beginning of progress report period: November 28, 2021 End of progress report period: December 05, 2021  Today's Date: 12/05/2021 OT Individual Time: 0830-0900 OT Individual Time Calculation (min): 30 min    Patient has met 2 of 3 short term goals.  Pt continues to need min A with UB dressing but has progressed well with her LB dressing and toilet transfers.  At home pt uses drop arm  BSC in her room as her bathroom is not WC accessible. She frequently sits in the recliner and needs to transfer recliner to [facility].  Patient continues to demonstrate the following deficits: muscle weakness and muscle joint tightness, decreased cardiorespiratoy endurance, abnormal tone and decreased coordination, delayed processing, and decreased sitting balance, decreased standing balance, decreased postural control, hemiplegia, and decreased balance strategies and therefore will continue to benefit from skilled OT intervention to enhance overall performance with BADL and Reduce care partner burden.  Patient progressing toward long term goals..  Continue plan of care.  OT Short Term Goals Week 1:  OT Short Term Goal 1 (Week 1): Patient will don UB clothing with set-up assist with use of hemi technique. OT Short Term Goal 1 - Progress (Week 1): Progressing toward goal OT Short Term Goal 2 (Week 1): Patient will complete toilet transfer via squat-pivot and use of hemi-walker. OT Short Term Goal 2 - Progress (Week 1): Met OT Short Term Goal 3 (Week 1): Patient will complete 1/3 parts of LB dressing with Mod A. OT Short Term Goal 3 - Progress (Week 1): Met Week 2:  OT Short Term Goal 1 (Week 2): STGs = LTGs  LTGs set at a min A level.  Skilled Therapeutic Interventions/Progress Updates:    Pt seen this session to focus on bed mobility, sit to stand, dressing and stand pivot  transfers.  Pt continues to need min A to move to EOB and min A to start shirt over L hand (she can doff with S), to don pants she needs cues to start with L leg and to cross over R knee.  She needs assist to start pants over L foot as any tugging of material pulls on her ankle.  She then rises to stand with cues for wider stance for increased balance. Min A to hold balance as she pulled pants over hips with min A.   Used ball between legs for feedback to engage L leg with controlled sit to stands using hemiwalker.  Cues for SMALL steps with L foot as she completed stand pivot to wc.   Resting in w/c with alarm on and all needs met.    Therapy Documentation Precautions:  Precautions Precautions: Fall Precaution Comments: Hx recurrent falls Required Braces or Orthoses: Other Brace Other Brace: CAM boot on LLE Restrictions Weight Bearing Restrictions: No LLE Weight Bearing: Weight bearing as tolerated Other Position/Activity Restrictions: WBAT LLE with air cast or CAM boot?  Pain:   C/o R knee pain from over use, MD aware.  ADL: ADL Eating: Set up Where Assessed-Eating: Bed level Grooming: Setup Where Assessed-Grooming: Sitting at sink Upper Body Bathing: Minimal assistance Where Assessed-Upper Body Bathing: Sitting at sink Lower Body Bathing: Minimal assistance Where Assessed-Lower Body Bathing: Sitting at sink, Standing at sink Upper Body Dressing: Minimal assistance Where Assessed-Upper Body Dressing: Wheelchair Lower Body Dressing: Moderate assistance Where Assessed-Lower Body Dressing: Wheelchair Toileting: Moderate assistance Where Assessed-Toileting: Control and instrumentation engineer  Transfer: Minimal assistance Toilet Transfer Method: Stand pivot Toilet Transfer Equipment: Grab bars, Raised toilet seat Tub/Shower Transfer: Not assessed  Therapy/Group: Individual Therapy  [place] 12/05/2021, 8:22 AM

## 2021-12-05 NOTE — Progress Notes (Signed)
Speech Language Pathology Daily Session Note  Patient Details  Name: Erika [name] MRN: [id number] Date of Birth: [birth year]  Today's Date: 12/05/2021 SLP Individual Time: 0916-1000 SLP Individual Time Calculation (min): 44 min  Short Term Goals: Week 1: SLP Short Term Goal 1 (Week 1): Pt will use external aids to organize/recall appointments, daily schedules for family and medical information with min A SLP Short Term Goal 2 (Week 1): Pt will verbalize 3-4 sequencing steps for transfers/gait training with min A to reduce impulsivity and risk of falls SLP Short Term Goal 3 (Week 1): Pt will complete moderately complex problem solving tasks with min A SLP Short Term Goal 4 (Week 1): Pt will participate in selective attention tasks provided min A verbal and visual cues SLP Short Term Goal 5 (Week 1): Patient will utilize word-finding strategied during high-level structured language tasks with Mod I.  Skilled Therapeutic Interventions: Pt seen for skilled ST with focus on cognitive goals. Pt continues to endorse word finding difficulties, no episodes noted during 45 minute treatment session with complex conversation. SLP facilitating thought organization task with focus on word production strategies by providing min A cues to increase accuracy. Pt states her husband tells her she "has a cognitive decline" but besides reported word finding trouble she feels "normal". Pt left in wheelchair with RN present for med pass. Cont ST POC.   Pain Pain Assessment Pain Scale: 0-10 Pain Score: 0-No pain Pain Type: Acute pain Pain Location: Leg Pain Orientation: Left Pain Descriptors / Indicators: Aching Pain Onset: On-going Pain Intervention(s): Medication (See eMAR)  Therapy/Group: Individual Therapy  Dewaine Conger 12/05/2021, 10:01 AM

## 2021-12-05 NOTE — Plan of Care (Signed)
  Problem: RH Functional Use of Upper Extremity Goal: LTG Patient will use RT/LT upper extremity as a (OT) Description: LTG: Patient will use right/left upper extremity as a stabilizer/gross assist/diminished/nondominant/dominant level with assist, with/without cues during functional activity (OT) Flowsheets (Taken 12/05/2021 0823) LTG: Use of upper extremity in functional activities: LUE as a stabilizer LTG: Pt will use upper extremity in functional activity with assistance level of: (goal adjusted as RUE extremity listed in goal, pt has L hemiparesis) Supervision/Verbal cueing Note: goal adjusted as RUE extremity listed in goal, pt has L hemiparesis   Problem: RH Tub/Shower Transfers Goal: LTG Patient will perform tub/shower transfers w/assist (OT) Description: LTG: Patient will perform tub/shower transfers with assist, with/without cues using equipment (OT) Flowsheets (Taken 12/05/2021 0823) LTG: Pt will perform tub/shower stall transfers with assistance level of: (LTG discontinued 12/05/21 as pt does not have wc bathroom access at home and it is not safe for her to ambulate into shower at this time.) -- Note: LTG discontinued 12/05/21 as pt does not have wc bathroom access at home and it is not safe for her to ambulate into shower at this time.

## 2021-12-05 NOTE — Progress Notes (Signed)
Patient ID: Erika [name], female   DOB: Mar 07, 1971, 50 y.o.   MRN: [id number] Sent message to Debbie-Admissions at [facility] for any updates on insurance auth, await response

## 2021-12-05 NOTE — Progress Notes (Signed)
PROGRESS NOTE   Subjective/Complaints: No isues overnite  ROS:  Pt denies SOB, abd pain, CP, N/V/C/D, and vision changes  Objective:   No results found. No results for input(s): WBC, HGB, HCT, PLT in the last 72 hours.  Recent Labs    12/04/21 0633  CREATININE 0.97      Intake/Output Summary (Last 24 hours) at 12/05/2021 0817 Last data filed at 12/04/2021 1300 Gross per 24 hour  Intake 240 ml  Output --  Net 240 ml         Physical Exam: Vital Signs Blood pressure (!) 147/68, pulse 71, temperature (!) 97.5 F (36.4 C), resp. rate 18, height 5\' 7"  (1.702 m), weight 98.9 kg, SpO2 93 %.  General: No acute distress Mood and affect are appropriate Heart: Regular rate and rhythm no rubs murmurs or extra sounds Lungs: Clear to auscultation, breathing unlabored, no rales or wheezes Abdomen: Positive bowel sounds, soft nontender to palpation, nondistended Extremities: No clubbing, cyanosis, or edema Skin: No evidence of breakdown, no evidence of rash  Neurologic: Cranial nerves II through XII intact, motor strength is 5/5 in RIght and 3-/5 left deltoid, bicep, tricep, grip, hip flexor, knee extensors, ankle dorsiflexor and plantar flexor Sensory exam normal sensation to light touch and proprioception in bilateral upper and lower extremities Tone- increased Left finger and wrist flexor tone, and left hip adductor tone  Musculoskeletal: Left foot medial malleolus tenderness, no pain or swelling RIght knee   Assessment/Plan: 1. Functional deficits which require 3+ hours per day of interdisciplinary therapy in a comprehensive inpatient rehab setting. Physiatrist is providing close team supervision and 24 hour management of active medical problems listed below. Physiatrist and rehab team continue to assess barriers to discharge/monitor patient progress toward functional and medical goals  Care Tool:  Bathing   Bathing activity did not occur: Refused Body parts bathed by patient: Chest, Abdomen, Front perineal area, Face, Left lower leg, Right lower leg, Left upper leg, Right upper leg, Left arm   Body parts bathed by helper: Right arm, Buttocks     Bathing assist Assist Level: Minimal Assistance - Patient > 75%     Upper Body Dressing/Undressing Upper body dressing Upper body dressing/undressing activity did not occur (including orthotics): Refused What is the patient wearing?: Pull over shirt    Upper body assist Assist Level: Minimal Assistance - Patient > 75%    Lower Body Dressing/Undressing Lower body dressing    Lower body dressing activity did not occur: Refused What is the patient wearing?: Pants     Lower body assist Assist for lower body dressing: Moderate Assistance - Patient 50 - 74%     Toileting Toileting    Toileting assist Assist for toileting: Moderate Assistance - Patient 50 - 74%     Transfers Chair/bed transfer  Transfers assist     Chair/bed transfer assist level: Moderate Assistance - Patient 50 - 74%     Locomotion Ambulation   Ambulation assist   Ambulation activity did not occur: Safety/medical concerns (Significant BLE weakness, fear-avoidance behavior, L hemi and ankle fx)  Assist level: Minimal Assistance - Patient > 75% Assistive device: Walker-hemi Max distance: 34'  Walk 10 feet activity   Assist  Walk 10 feet activity did not occur: Safety/medical concerns (Significant BLE weakness, fear-avoidance behavior, L hemi and ankle fx)  Assist level: Minimal Assistance - Patient > 75% Assistive device: Walker-hemi   Walk 50 feet activity   Assist Walk 50 feet with 2 turns activity did not occur: Safety/medical concerns (Significant BLE weakness, fear-avoidance behavior, L hemi and ankle fx)         Walk 150 feet activity   Assist Walk 150 feet activity did not occur: Safety/medical concerns (Significant BLE weakness,  fear-avoidance behavior, L hemi and ankle fx)         Walk 10 feet on uneven surface  activity   Assist Walk 10 feet on uneven surfaces activity did not occur: Safety/medical concerns (Significant BLE weakness, fear-avoidance behavior, L hemi and ankle fx)         Wheelchair     Assist Is the patient using a wheelchair?: Yes Type of Wheelchair: Manual    Wheelchair assist level: Supervision/Verbal cueing, Set up assist Max wheelchair distance: >200'    Wheelchair 50 feet with 2 turns activity    Assist        Assist Level: Supervision/Verbal cueing   Wheelchair 150 feet activity     Assist      Assist Level: Supervision/Verbal cueing   Blood pressure (!) 147/68, pulse 71, temperature (!) 97.5 F (36.4 C), resp. rate 18, height 5\' 7"  (1.702 m), weight 98.9 kg, SpO2 93 %.    Medical Problem List and Plan: 1.  Debility functional deficits secondary to acute hypoxic respiratory failure/community-acquired pneumonia.  Antibiotic therapy completed.  Wean oxygen to room air             -patient may  shower             -ELOS/Goals: 9-12d, sup/minA goals, ELOS 12/19  Continue CIR- PT, OT and SLP 2.  Impaired mobility, lateral steps: continue Lovenox, amb distance 8' recorded in last PT note , no sig bruising in abd area              -antiplatelet therapy: Aspirin 81 mg daily, Plavix 75 mg daily 3. Pain Management/chronic pain: continue Lyrica 225 mg twice daily, hydrocodone as needed, Flexeril 10 mg nightly, Advil as needed  C/o of Left foot pain as well as RIgh tknee pain, pt not weight bearing much on left side due to pain  4. Mood/PTSD: Continue Lexapro 20 mg daily, Lamictal 200 mg nightly, Thorazine 100 mg nightly, Cogentin 0.5 mg nightly, Klonopin 0.5 mg twice daily             -antipsychotic agents: [name] 5. Neuropsych: This patient is capable of making decisions on her own behalf. 6. Skin/Wound Care: Routine skin checks 7. Fluids/Electrolytes/Nutrition:  Routine in and outs with follow-up chemistries 8.  Avulsion fracture medial malleolus left foot status post fall.  [facility] service Dr. Roland Rack.  Aircast as needed. CAM walker ordered. Weightbearing as tolerated 9.  History of right pontine infarction.  Patient received CIR July 2022.  Continue aspirin and Plavix 10.  CAD/CABG.  No chest pain or shortness of breath.  Continue Plavix/aspirin 11.  Hypertension.  Elevated, but below goal for her given her ICA stenosis- continue Coreg 6.25 mg twice daily, Imdur 60 mg twice daily, Aldactone 25 mg daily, Demadex 10 mg daily.  Monitor with increased mobility  82/50- BP 539 systolic- qwill not change meds due to ICA stenosis.  12.  Diabetes mellitus.  Hemoglobin A1c 7.0.  Semglee 54 units daily.  Check blood sugars before meals and at bedtime  12/11- CBGs 110-183- usually under 150- con't regimen CBG (last 3)  Recent Labs    12/04/21 1639 12/04/21 2028 12/05/21 0556  GLUCAP 100* 137* 71    Reduce semglee to 50U- first dose today  13.  Diastolic congestive heart failure.  Continue Demadex 10 mg daily.  Monitor for any signs of fluid overload- no leg swelling noted  Filed Weights   11/27/21 1451  Weight: 98.9 kg    14.  Hyperlipidemia.  Crestor/Zetia 15.  GERD.  Continue Protonix 16.  Constipation.  Continue MiraLAX twice daily, Senokot S2 tablets twice daily  12/11- bowels going QOD- con't regimen 17.  Spasticity related to CVA , may benefit from Botox as Outpt. Increase baclofen to 10mg  HS   LOS: 8 days A FACE TO FACE EVALUATION WAS PERFORMED  Charlett Blake 12/05/2021, 8:17 AM

## 2021-12-06 LAB — GLUCOSE, CAPILLARY
Glucose-Capillary: 96 mg/dL (ref 70–99)
Glucose-Capillary: 163 mg/dL — ABNORMAL HIGH (ref 70–99)
Glucose-Capillary: 137 mg/dL — ABNORMAL HIGH (ref 70–99)
Glucose-Capillary: 115 mg/dL — ABNORMAL HIGH (ref 70–99)

## 2021-12-06 NOTE — Progress Notes (Signed)
Patient ID: Erika [name], female   DOB: Nov 02, 1971, 49 y.o.   MRN: [id number] Spoke with Debbie-Admissions at [facility] to ask again any news. She reports their system was changed and they are having to re-do the auth. Husband was present when this conversation was taking place. Hopefully will have an answer today or latest tomorrow. Debbie to update bot this worker and husband later today.

## 2021-12-06 NOTE — Progress Notes (Signed)
Physical Therapy Session Note  Patient Details  Name: Erika [name] MRN: [id number] Date of Birth: 19-Apr-1971  Today's Date: 12/06/2021 PT Individual Time: 1530-1600 PT Individual Time Calculation (min): 30 min   Short Term Goals: Week 2:  PT Short Term Goal 1 (Week 2): Pt will ambulate 51'  w/mod A and LRAD PT Short Term Goal 2 (Week 2): Pt will perform sit <>stands w/LRAD and mod A consistently PT Short Term Goal 3 (Week 2): Pt will perform bed <>chair transfers w/LRAD and mod A consistently PT Short Term Goal 4 (Week 2): Pt will perform bed mobility w/CGA for improved independence  Skilled Therapeutic Interventions/Progress Updates:    Pt received seated in bed, agreeable to PT session. Pt reports pain in R knee and L ankle, requesting pain medication at end of session, nursing notified. Supine to sit with Supervision. Sit to stand with CGA to [facility]. Stand pivot transfer to w/c with HW and CGA. Pt then unable to stand back up from w/c to [facility] due to fatigue. Pt also has difficulty with scooting herself forwards in w/c especially L side. Slide board transfer w/c to bed with mod A. Sit to stand with CGA to [facility] from elevated bed. Sidesteps L/R 2 x 10 ft each direction with HW and min A for balance. Pt returned to supine at end of session with mod A needed for BLE management. Pt left seated in bed with needs in reach, bed alarm in place.  Therapy Documentation Precautions:  Precautions Precautions: Fall Precaution Comments: Hx recurrent falls Required Braces or Orthoses: Other Brace Other Brace: CAM boot on LLE Restrictions Weight Bearing Restrictions: No LLE Weight Bearing: Weight bearing as tolerated Other Position/Activity Restrictions: WBAT LLE with air cast or CAM boot?     Therapy/Group: Individual Therapy   Excell Seltzer, PT, DPT, CSRS  12/06/2021, 5:05 PM

## 2021-12-06 NOTE — Progress Notes (Signed)
Occupational Therapy Session Note  Patient Details  Name: Erika [name] MRN: [id number] Date of Birth: [birth year]  Today's Date: 12/06/2021 OT Individual Time: 1030-1100 OT Individual Time Calculation (min): 30 min    Short Term Goals: Week 1:  OT Short Term Goal 1 (Week 1): Patient will don UB clothing with set-up assist with use of hemi technique. OT Short Term Goal 1 - Progress (Week 1): Progressing toward goal OT Short Term Goal 2 (Week 1): Patient will complete toilet transfer via squat-pivot and use of hemi-walker. OT Short Term Goal 2 - Progress (Week 1): Met OT Short Term Goal 3 (Week 1): Patient will complete 1/3 parts of LB dressing with Mod A. OT Short Term Goal 3 - Progress (Week 1): Met Week 2:  OT Short Term Goal 1 (Week 2): STGs = LTGs   Skilled Therapeutic Interventions/Progress Updates:    Pt greeted at time of session sitting up in wheelchair with visitor present who did not remain throughout session. Pt did not need to toilet and ADL needs met. Pt transported room <> gym on [facility] for time and energy conservation. Focused on LUE PROM/AAROM for max carryover as pt states she feels that digits and LUE is getting "tighter." Working proximal > distal focusing on ROM at elbow, forearm, wrist, and digits/thumb, having pt perform for digits as well for max carryover. Pt attempting stand pivot wheelchair > straight back chair with hemiwalker (CAM boot already on) but when coming up to stand, pt with posterior lean and unable to correct/fix, returning to sitting x3 attempts. Transported back to room and attempted stand pivot back to bed, again having LOB posteriorly and unable. Attempted squat pivot wheelchair > bed but again unable to sequence and stating her legs are "too tired from this morning." Eventually needing Stedy for transfer wheelchair > bed. Alarm on call bell in reach.   Therapy Documentation Precautions:  Precautions Precautions: Fall Precaution Comments: Hx  recurrent falls Required Braces or Orthoses: Other Brace Other Brace: CAM boot on LLE Restrictions Weight Bearing Restrictions: No LLE Weight Bearing: Weight bearing as tolerated Other Position/Activity Restrictions: WBAT LLE with air cast or CAM boot?     Therapy/Group: Individual Therapy  Viona Gilmore 12/06/2021, 7:23 AM

## 2021-12-06 NOTE — Progress Notes (Signed)
Occupational Therapy Session Note  Patient Details  Name: Erika [name] MRN: [id number] Date of Birth: June 11, 1971  Today's Date: 12/06/2021 OT Individual Time: 8937-3428 OT Individual Time Calculation (min): 60 min    Short Term Goals: Week 2:  OT Short Term Goal 1 (Week 2): STGs = LTGs  Skilled Therapeutic Interventions/Progress Updates:    Pt received in bed ready for therapy and agreeable to a shower.  Therapy session focused on safe mobility and balance. Pt worked on coming to sit to EOB with CGA, donned CAM boot for transfer, sit to stand to hemiwalker with cues for SMALL steps to keep both legs in control. She then transferred to tub bench using a squat pivot with min A and small scoots as tub bench set up did not have a closer grab bar for her to hold on to.  It is a step in shower with a wide step similar to a bath tub.  CAM boot removed, In shower,  bathed with min A overall.  CAM redonned for transfer. Transferred out of shower to w/c to complete dressing in wc.  Her mother in law was present and tried to help her too much.  Education on importance of pt engaging in hemi tasks herself with only min A to be as independent as possible.   Donned knee brace.   Pt resting in  wc   with  mother in law present and all needs met.    Therapy Documentation Precautions:  Precautions Precautions: Fall Precaution Comments: Hx recurrent falls Required Braces or Orthoses: Other Brace Other Brace: CAM boot on LLE Restrictions Weight Bearing Restrictions: No LLE Weight Bearing: Weight bearing as tolerated Other Position/Activity Restrictions: WBAT LLE with air cast or CAM boot?    Vital Signs: Therapy Vitals Temp: 97.9 F (36.6 C) Pulse Rate: 64 Resp: 18 BP: 117/64 Patient Position (if appropriate): Lying Oxygen Therapy SpO2: 95 % O2 Device: Room Air Pain: Pain Assessment Pain Scale: 0-10 Pain Score: 5  Pain Type: Acute pain Pain Location: Knee Pain Orientation:  Right Pain Descriptors / Indicators: Aching Pain Intervention(s): Medication (See eMAR) (Voltaren gel) ADL: ADL Eating: Set up Where Assessed-Eating: Bed level Grooming: Setup Where Assessed-Grooming: Sitting at sink Upper Body Bathing: Minimal assistance Where Assessed-Upper Body Bathing: Shower Lower Body Bathing: Minimal assistance Where Assessed-Lower Body Bathing: Shower Upper Body Dressing: Setup Where Assessed-Upper Body Dressing: Wheelchair Lower Body Dressing: Moderate assistance Where Assessed-Lower Body Dressing: Wheelchair Toileting: Moderate assistance Where Assessed-Toileting: Glass blower/designer: Psychiatric nurse Method: Arts development officer: Grab bars, Raised toilet seat Tub/Shower Transfer: Minimal assistance Tub/Shower Transfer Method: Squat pivot Tub/Shower Equipment: Transfer tub bench, Grab bars     Therapy/Group: Individual Therapy  [place] 12/06/2021, 10:04 AM

## 2021-12-06 NOTE — Progress Notes (Signed)
Physical Therapy Session Note  Patient Details  Name: Erika [name] MRN: [id number] Date of Birth: Apr 01, 1971  Today's Date: 12/06/2021 PT Individual Time: 1300-1400 PT Individual Time Calculation (min): 60 min   Short Term Goals: Week 1:  PT Short Term Goal 1 (Week 1): Pt will perform sit <>stand w/mod A and LRAD PT Short Term Goal 1 - Progress (Week 1): Progressing toward goal PT Short Term Goal 2 (Week 1): Pt will perform bed <>chair transfer w/mod A and LRAD PT Short Term Goal 2 - Progress (Week 1): Progressing toward goal PT Short Term Goal 3 (Week 1): Pt will ambulate 20' w/mod A and LRAD PT Short Term Goal 3 - Progress (Week 1): Met  Skilled Therapeutic Interventions/Progress Updates:    pt received in bed and agreeable to therapy, cam boot and wrist brace in place. Husband present throughout session. Pt reports some R knee pain during session, unrated but pt states is manageable and no intervention required. Session focused on pt centered goal of improving strength and balance during transfers to prevent falls. Stand pivot transfer to w/c with min A to power up and for balance with HW. Pt propelled chair with RLE and RUE x ~100 ft to promote functional independence. Pt then transported to gym for time management. Stand pivot transfer to mat table in same manner as above. Pt directed in Sit to stand to [facility], focusing on improving strength with eccentric control. Pt required min A fading to CGA for Sit to stand. Pt then navigated 6 cones x 1 and 3 cones x 1 for improved balance. Noted mild inattention to L side, occ kicking cones with L foot. Occ min A for small LOB, largely CGA. Pt became fatigued after navigating 3 cones on second bout and returned to w/c. Pt then transported back to room and returned to bed in same manner as above, was left with all needs in reach and alarm active, husband at bedside.   Therapy Documentation Precautions:  Precautions Precautions: Fall Precaution  Comments: Hx recurrent falls Required Braces or Orthoses: Other Brace Other Brace: CAM boot on LLE Restrictions Weight Bearing Restrictions: No LLE Weight Bearing: Weight bearing as tolerated Other Position/Activity Restrictions: WBAT LLE with air cast or CAM boot?    Therapy/Group: Individual Therapy  Mickel Fuchs 12/06/2021, 3:57 PM

## 2021-12-06 NOTE — Progress Notes (Signed)
PROGRESS NOTE   Subjective/Complaints: Appreciate SW note , discussed d/c with pt , husband and mother in law  Husband concerned with drowsiness during therapy  ROS:  Pt denies SOB, abd pain, CP, N/V/C/D, and vision changes  Objective:   No results found. No results for input(s): WBC, HGB, HCT, PLT in the last 72 hours.  Recent Labs    12/04/21 0633  CREATININE 0.97      Intake/Output Summary (Last 24 hours) at 12/06/2021 0912 Last data filed at 12/06/2021 0740 Gross per 24 hour  Intake 120 ml  Output --  Net 120 ml         Physical Exam: Vital Signs Blood pressure 117/64, pulse 64, temperature 97.9 F (36.6 C), resp. rate 18, height 5\' 7"  (1.702 m), weight 98.9 kg, SpO2 95 %.  General: No acute distress Mood and affect are appropriate Heart: Regular rate and rhythm no rubs murmurs or extra sounds Lungs: Clear to auscultation, breathing unlabored, no rales or wheezes Abdomen: Positive bowel sounds, soft nontender to palpation, nondistended Extremities: No clubbing, cyanosis, or edema Skin: No evidence of breakdown, no evidence of rash Awake and alert  Neurologic: Cranial nerves II through XII intact, motor strength is 5/5 in RIght and 3-/5 left deltoid, bicep, tricep, grip, hip flexor, knee extensors, ankle dorsiflexor and plantar flexor Sensory exam normal sensation to light touch and proprioception in bilateral upper and lower extremities Tone- increased Left finger and wrist flexor tone, and left hip adductor tone  Musculoskeletal: Left foot medial malleolus tenderness, no pain or swelling RIght knee   Assessment/Plan: 1. Functional deficits which require 3+ hours per day of interdisciplinary therapy in a comprehensive inpatient rehab setting. Physiatrist is providing close team supervision and 24 hour management of active medical problems listed below. Physiatrist and rehab team continue to assess  barriers to discharge/monitor patient progress toward functional and medical goals  Care Tool:  Bathing  Bathing activity did not occur: Refused Body parts bathed by patient: Chest, Abdomen, Front perineal area, Face, Left lower leg, Right lower leg, Left upper leg, Right upper leg, Left arm   Body parts bathed by helper: Right arm, Buttocks     Bathing assist Assist Level: Minimal Assistance - Patient > 75%     Upper Body Dressing/Undressing Upper body dressing Upper body dressing/undressing activity did not occur (including orthotics): Refused What is the patient wearing?: Pull over shirt    Upper body assist Assist Level: Minimal Assistance - Patient > 75%    Lower Body Dressing/Undressing Lower body dressing    Lower body dressing activity did not occur: Refused What is the patient wearing?: Pants     Lower body assist Assist for lower body dressing: Moderate Assistance - Patient 50 - 74%     Toileting Toileting    Toileting assist Assist for toileting: Moderate Assistance - Patient 50 - 74%     Transfers Chair/bed transfer  Transfers assist     Chair/bed transfer assist level: Moderate Assistance - Patient 50 - 74%     Locomotion Ambulation   Ambulation assist   Ambulation activity did not occur: Safety/medical concerns (Significant BLE weakness, fear-avoidance behavior, L hemi and  ankle fx)  Assist level: Minimal Assistance - Patient > 75% Assistive device: Walker-hemi Max distance: 17'   Walk 10 feet activity   Assist  Walk 10 feet activity did not occur: Safety/medical concerns (Significant BLE weakness, fear-avoidance behavior, L hemi and ankle fx)  Assist level: Minimal Assistance - Patient > 75% Assistive device: Walker-hemi   Walk 50 feet activity   Assist Walk 50 feet with 2 turns activity did not occur: Safety/medical concerns (Significant BLE weakness, fear-avoidance behavior, L hemi and ankle fx)  Assist level: Minimal Assistance  - Patient > 75% Assistive device: Walker-hemi    Walk 150 feet activity   Assist Walk 150 feet activity did not occur: Safety/medical concerns (Significant BLE weakness, fear-avoidance behavior, L hemi and ankle fx)         Walk 10 feet on uneven surface  activity   Assist Walk 10 feet on uneven surfaces activity did not occur: Safety/medical concerns (Significant BLE weakness, fear-avoidance behavior, L hemi and ankle fx)         Wheelchair     Assist Is the patient using a wheelchair?: Yes Type of Wheelchair: Manual    Wheelchair assist level: Supervision/Verbal cueing, Set up assist Max wheelchair distance: >200'    Wheelchair 50 feet with 2 turns activity    Assist        Assist Level: Supervision/Verbal cueing   Wheelchair 150 feet activity     Assist      Assist Level: Supervision/Verbal cueing   Blood pressure 117/64, pulse 64, temperature 97.9 F (36.6 C), resp. rate 18, height 5\' 7"  (1.702 m), weight 98.9 kg, SpO2 95 %.    Medical Problem List and Plan: 1.  Debility functional deficits secondary to acute hypoxic respiratory failure/community-acquired pneumonia.  Antibiotic therapy completed.  Wean oxygen to room air             -patient may  shower             -ELOS/Goals: 9-12d, sup/minA goals, ELOS 12/19  Continue CIR- PT, OT and SLP 2.  Impaired mobility, lateral steps: continue Lovenox,             -antiplatelet therapy: Aspirin 81 mg daily, Plavix 75 mg daily 3. Pain Management/chronic pain: continue Lyrica 225 mg twice daily, hydrocodone as needed, Flexeril 10 mg nightly, Advil as needed  C/o of Left foot pain as well as RIgh tknee pain, pt not weight bearing much on left side due to pain  4. Mood/PTSD: Continue Lexapro 20 mg daily, Lamictal 200 mg nightly, Thorazine 100 mg nightly, Cogentin 0.5 mg nightly, Klonopin 0.5 mg twice daily             -antipsychotic agents: [name] 5. Neuropsych: This patient is capable of making decisions  on her own behalf. 6. Skin/Wound Care: Routine skin checks 7. Fluids/Electrolytes/Nutrition: Routine in and outs with follow-up chemistries 8.  Avulsion fracture medial malleolus left foot status post fall.  [facility] Dr. Roland Rack.  Aircast as needed. CAM walker ordered. Weightbearing as tolerated 9.  History of right pontine infarction.  Patient received CIR July 2022.  Continue aspirin and Plavix 10.  CAD/CABG.  No chest pain or shortness of breath.  Continue Plavix/aspirin 11.  Hypertension.  Elevated, but below goal for her given her ICA stenosis- continue Coreg 6.25 mg twice daily, Imdur 60 mg twice daily, Aldactone 25 mg daily, Demadex 10 mg daily.  Monitor with increased mobility   Vitals:   12/05/21  1801 12/06/21 0625  BP: 126/63 117/64  Pulse: 72 64  Resp: 18 18  Temp: 98.3 F (36.8 C) 97.9 F (36.6 C)  SpO2: 95% 95%  ' 12.  Diabetes mellitus.  Hemoglobin A1c 7.0.  Semglee 54 units daily.  Check blood sugars before meals and at bedtime  12/11- CBGs 110-183- usually under 150- con't regimen CBG (last 3)  Recent Labs    12/05/21 1700 12/05/21 2142 12/06/21 0634  GLUCAP 139* 103* 96    Reduce semglee to 50U- controlled 12/14 13.  Diastolic congestive heart failure.  Continue Demadex 10 mg daily.  Monitor for any signs of fluid overload- no leg swelling noted  Filed Weights   11/27/21 1451  Weight: 98.9 kg    14.  Hyperlipidemia.  Crestor/Zetia 15.  GERD.  Continue Protonix 16.  Constipation.  Continue MiraLAX twice daily, Senokot S2 tablets twice daily  12/11- bowels going QOD- con't regimen 17.  Spasticity related to CVA , may benefit from Botox as Outpt. Increase baclofen to 10mg  HS   LOS: 9 days A FACE TO FACE EVALUATION WAS PERFORMED  Charlett Blake 12/06/2021, 9:12 AM

## 2021-12-06 NOTE — Progress Notes (Signed)
Physical Therapy Session Note  Patient Details  Name: Erika [name] MRN: [id number] Date of Birth: 07/07/1971  Today's Date: 12/06/2021 PT Individual Time: 1500-1526 PT Individual Time Calculation (min): 26 min   Short Term Goals: Week 1:  PT Short Term Goal 1 (Week 1): Pt will perform sit <>stand w/mod A and LRAD PT Short Term Goal 1 - Progress (Week 1): Progressing toward goal PT Short Term Goal 2 (Week 1): Pt will perform bed <>chair transfer w/mod A and LRAD PT Short Term Goal 2 - Progress (Week 1): Progressing toward goal PT Short Term Goal 3 (Week 1): Pt will ambulate 20' w/mod A and LRAD PT Short Term Goal 3 - Progress (Week 1): Met Week 2:  PT Short Term Goal 1 (Week 2): Pt will ambulate 24'  w/mod A and LRAD PT Short Term Goal 2 (Week 2): Pt will perform sit <>stands w/LRAD and mod A consistently PT Short Term Goal 3 (Week 2): Pt will perform bed <>chair transfers w/LRAD and mod A consistently PT Short Term Goal 4 (Week 2): Pt will perform bed mobility w/CGA for improved independence  Skilled Therapeutic Interventions/Progress Updates:   Received pt semi-reclined in bed with husband present at bedside. Pt agreeable to PT treatment, and denied any pain during session - requesting to work on sit<>stands. Session with emphasis on functional mobility/transfers, standing balance, and endurance.  L CAM boot donned and pt transferred semi-reclined<>sitting EOB with HOB elevated and CGA - cues for LUE management. Worked on blocked practice sit<>stands from elevated EOB with HW and min A  fading to CGA x 5 reps guarding L knee but only 1 instance of very mild L knee buckling - cues for anterior weight shifting and hip extension. Worked on alternating marching x10 bilaterally guarding L knee from buckling with emphasis on weight shifting and balance. Transitioned to sit<>stands x 5 reps without AD and CGA again guarding L knee while supporting LUE. Pt with occasional posterior leans but  with cues able to shift weight onto toes to stabilize. Sit<>supine with supervision and concluded session with pt supine in bed, needs within reach, and bed alarm on awaiting next PT session.   Therapy Documentation Precautions:  Precautions Precautions: Fall Precaution Comments: Hx recurrent falls Required Braces or Orthoses: Other Brace Other Brace: CAM boot on LLE Restrictions Weight Bearing Restrictions: No LLE Weight Bearing: Weight bearing as tolerated Other Position/Activity Restrictions: WBAT LLE with air cast or CAM boot?  Therapy/Group: Individual Therapy Alfonse Alpers PT, DPT   12/06/2021, 7:23 AM

## 2021-12-07 ENCOUNTER — Ambulatory Visit (HOSPITAL_BASED_OUTPATIENT_CLINIC_OR_DEPARTMENT_OTHER): Payer: Managed Care, Other (non HMO) | Admitting: Internal Medicine

## 2021-12-07 LAB — GLUCOSE, CAPILLARY
Glucose-Capillary: 83 mg/dL (ref 70–99)
Glucose-Capillary: 100 mg/dL — ABNORMAL HIGH (ref 70–99)
Glucose-Capillary: 61 mg/dL — ABNORMAL LOW (ref 70–99)
Glucose-Capillary: 123 mg/dL — ABNORMAL HIGH (ref 70–99)

## 2021-12-07 NOTE — Progress Notes (Signed)
Occupational Therapy Session Note  Patient Details  Name: Erika [name] MRN: [id number] Date of Birth: 08-07-1971  Today's Date: 12/07/2021 OT Individual Time: 1700-1749 OT Individual Time Calculation (min): 26 min    Short Term Goals: Week 1:  OT Short Term Goal 1 (Week 1): Patient will don UB clothing with set-up assist with use of hemi technique. OT Short Term Goal 1 - Progress (Week 1): Progressing toward goal OT Short Term Goal 2 (Week 1): Patient will complete toilet transfer via squat-pivot and use of hemi-walker. OT Short Term Goal 2 - Progress (Week 1): Met OT Short Term Goal 3 (Week 1): Patient will complete 1/3 parts of LB dressing with Mod A. OT Short Term Goal 3 - Progress (Week 1): Met Week 2:  OT Short Term Goal 1 (Week 2): STGs = LTGs  Patient met lying supine in bed in agreement with OT treatment session. 9/10 pain reported at rest and with activity in LLE. RN present at start of session to administer pain meds. Mod A to don L wrist cock-up splint and Max A to don L CAM boot. Patient completed grooming tasks seated EOB with set-up assist. Sit to stand from EOB with Min A and functional mobility ~25-47ft with hemi -walker and Min A. Cues for management of LLE. Session concluded with patient seated in wc with call bell within reach and all needs met. Belt alarm not activated 2/2 quick handoff to PT.   Therapy Documentation Precautions:  Precautions Precautions: Fall Precaution Comments: Hx recurrent falls Required Braces or Orthoses: Other Brace Other Brace: CAM boot on LLE Restrictions Weight Bearing Restrictions: No LLE Weight Bearing: Weight bearing as tolerated Other Position/Activity Restrictions: WBAT LLE with air cast or CAM boot? General:    Therapy/Group: Individual Therapy  [name] R [place] 12/07/2021, 6:52 AM

## 2021-12-07 NOTE — Progress Notes (Signed)
Hypoglycemic Event  CBG: 61  Treatment: 8 oz juice/soda with a full meal.   Symptoms: None  Follow-up CBG: Time: 1800 CBG Result:152  Possible Reasons for Event: Unknown  Comments/MD notified:Pam PA is aware.     [place]

## 2021-12-07 NOTE — Progress Notes (Signed)
Physical Therapy Session Note  Patient Details  Name: Erika [name] MRN: [id number] Date of Birth: Apr 27, 1971  Today's Date: 12/07/2021 PT Individual Time: 0900-0954 PT Individual Time Calculation (min): 54 min   Short Term Goals: Week 1:  PT Short Term Goal 1 (Week 1): Pt will perform sit <>stand w/mod A and LRAD PT Short Term Goal 1 - Progress (Week 1): Progressing toward goal PT Short Term Goal 2 (Week 1): Pt will perform bed <>chair transfer w/mod A and LRAD PT Short Term Goal 2 - Progress (Week 1): Progressing toward goal PT Short Term Goal 3 (Week 1): Pt will ambulate 20' w/mod A and LRAD PT Short Term Goal 3 - Progress (Week 1): Met Week 2:  PT Short Term Goal 1 (Week 2): Pt will ambulate 4'  w/mod A and LRAD PT Short Term Goal 2 (Week 2): Pt will perform sit <>stands w/LRAD and mod A consistently PT Short Term Goal 3 (Week 2): Pt will perform bed <>chair transfers w/LRAD and mod A consistently PT Short Term Goal 4 (Week 2): Pt will perform bed mobility w/CGA for improved independence  Skilled Therapeutic Interventions/Progress Updates:  Pt received seated in WC in room, reported 7/10 pain in L ankle and was premedicated. Offered repositioning, light stretch and distraction throughout session for pain modulation. Emphasis of session on gait training, BLE strength and L NMR. Pt self-propelled 15' from room to hallway using hemi technique (RLE > LUE), Dr. Letta Pate present for rounds. Pt adamant about going home today and upset about the thought that she could miss her daughter's birthday tomorrow. Spoke to CSW and family, husband insistent on pt DC straight to SNF and is aware insurance will not cover this. Lengthy discussion w/pt and husband regarding DC situation via phone, CSW updated and will continue to search for a SNF placement following DC. Pt very lethargic throughout session and seemingly sad, provided emotional support and encouragement as necessary.   Pt performed  sit <>stands from [facility] w/min A-light mod A w/HW. Pt required extra time for initiation and demonstrated difficulty w/anterior weight shift and B knee extension. Pt held stand for 10-20s prior to needed to sit due to BLE weakness, min A for stand <>sit for eccentric control. Pt performed final sit <>stand w/min A and ambulated 5' w/45 degree turn to R side w/HW and min A to EOB. Stand <>sit to bed w/min A and pt performed sit <>supine w/min A for LLE management. Removed CAM boot and R shoe w/total A and pt was left supine in bed, all needs in reach.   Therapy Documentation Precautions:  Precautions Precautions: Fall Precaution Comments: Hx recurrent falls Required Braces or Orthoses: Other Brace Other Brace: CAM boot on LLE Restrictions Weight Bearing Restrictions: No LLE Weight Bearing: Weight bearing as tolerated Other Position/Activity Restrictions: WBAT LLE with air cast or CAM boot?   Therapy/Group: Individual Therapy Cruzita Lederer Junius Faucett, PT, DPT  12/07/2021, 7:44 AM

## 2021-12-07 NOTE — Progress Notes (Signed)
Occupational Therapy Session Note  Patient Details  Name: Erika [name] MRN: [id number] Date of Birth: [birth year]  Today's Date: 12/07/2021 OT Individual Time: 1100-1155 OT Individual Time Calculation (min): 55 min    Short Term Goals: Week 2:  OT Short Term Goal 1 (Week 2): STGs = LTGs  Skilled Therapeutic Interventions/Progress Updates:  Pt received asleep in supine but easily able to arouse and     agreeable to OT intervention. Session focus on functional transfers. Pt completed supine>sit with MIN A needing most assist to elevate trunk and scoot hips to EOB. Total A to don shoes and CAM boot from EOB. Pt initially needed MAX A to stand from EOB with HW d/t reports of weakness/pain in R knee. MOD A to stand pivot to w/c with 2 prior failed attempts needing to sit back down on EOB. Once in w/c, transported to gym with total A for time mgmt. Worked on repeated transfer from w/c<>EOM with [place] with pt progressing from MOD A to [place]. Pt additionally completed x10 sit<>stands from EOM with CGA. Pt reports "its just takes me awhile to get going," pt transported back to room with total A, additional stand pivot back to bed with HW and MIN A. MIN A for sit>supine. pt left supine in bed with bed alarm activated and all needs within reach.                  Therapy Documentation Precautions:  Precautions Precautions: Fall Precaution Comments: Hx recurrent falls Required Braces or Orthoses: Other Brace Other Brace: CAM boot on LLE Restrictions Weight Bearing Restrictions: No LLE Weight Bearing: Weight bearing as tolerated Other Position/Activity Restrictions: WBAT LLE with air cast or CAM boot?  Pain: unrated pain reported in R knee, provided rest breaks as needed with pain meds already provided.    Therapy/Group: Individual Therapy  Precious Haws 12/07/2021, 12:18 PM

## 2021-12-07 NOTE — Discharge Summary (Signed)
Physician Discharge Summary  Patient ID: Erika [name] MRN: [id number] DOB/AGE: 1971/09/10 50 y.o.  Admit date: 11/27/2021 Discharge date: 12/08/2021  Discharge Diagnoses:  Principal Problem:   CAP (community acquired pneumonia) Active Problems:   Debility DVT prophylaxis Mood stabilization/PTSD History of right pontine infarction CAD with CABG Hypertension Diabetes mellitus Diastolic congestive heart failure Hyperlipidemia GERD Constipation Obesity History of tobacco use  Discharged Condition: Stable  Significant Diagnostic Studies: DG Tibia/Fibula Left  Result Date: 11/21/2021 CLINICAL DATA:  Ankle fracture EXAM: LEFT TIBIA AND FIBULA - 2 VIEW COMPARISON:  None. FINDINGS: There is no evidence of fracture or other focal bone lesions. Soft tissues are unremarkable. IMPRESSION: No acute fracture identified in the tibia or fibula. Electronically Signed   By: Ofilia Neas M.D.   On: 11/21/2021 19:15   DG Ankle Complete Left  Result Date: 11/21/2021 CLINICAL DATA:  LEFT ankle and knee pain EXAM: LEFT ANKLE COMPLETE - 3+ VIEW COMPARISON:  None. FINDINGS: Small avulsion fragment fragments inferior to the medial malleolus. Ankle mortise intact. Talar dome is normal. No joint effusion. IMPRESSION: Age-indeterminate avulsion fracture from the tip of the medial malleolus. Electronically Signed   By: Suzy Bouchard M.D.   On: 11/21/2021 13:21   MR BRAIN WO CONTRAST  Result Date: 11/23/2021 CLINICAL DATA:  Transient ischemic attack EXAM: MRI HEAD WITHOUT CONTRAST TECHNIQUE: Multiplanar, multiecho pulse sequences of the brain and surrounding structures were obtained without intravenous contrast. COMPARISON:  06/27/2021 FINDINGS: Brain: No acute infarct, mass effect or extra-axial collection. No acute or chronic hemorrhage. Old right occipital and parietal cortical infarcts. Otherwise mild chronic small vessel disease. There is advanced chronic ischemic change within the  brainstem and extending into the cerebellar peduncles. Mild generalized volume loss. The midline structures are normal. Vascular: Major flow voids are preserved. Skull and upper cervical spine: Normal calvarium and skull base. Visualized upper cervical spine and soft tissues are normal. Sinuses/Orbits:No paranasal sinus fluid levels or advanced mucosal thickening. No mastoid or middle ear effusion. Normal orbits. IMPRESSION: 1. No acute intracranial abnormality. 2. Old right occipital and parietal cortical infarcts. 3. Advanced chronic ischemic change within the brainstem and cerebellar peduncles. Electronically Signed   By: Ulyses Jarred M.D.   On: 11/23/2021 23:42   DG Chest Portable 1 View  Result Date: 11/21/2021 CLINICAL DATA:  Shortness of breath EXAM: PORTABLE CHEST 1 VIEW COMPARISON:  09/08/2021 FINDINGS: Patchy opacities at the left greater than right lung bases. No pleural effusion. Stable cardiomediastinal contours. IMPRESSION: Patchy opacities at the left greater than right lung bases suspicious for pneumonia. Electronically Signed   By: Macy Mis M.D.   On: 11/21/2021 14:46   DG Knee Complete 4 Views Left  Result Date: 11/21/2021 CLINICAL DATA:  Left knee pain after fall. EXAM: LEFT KNEE - COMPLETE 4+ VIEW COMPARISON:  None. FINDINGS: No evidence of fracture, dislocation, or joint effusion. No evidence of arthropathy or other focal bone abnormality. Soft tissues are unremarkable. IMPRESSION: Negative. Electronically Signed   By: Marijo Conception M.D.   On: 11/21/2021 13:20    Labs:  Basic Metabolic Panel: Recent Labs  Lab 12/04/21 0633  CREATININE 0.97    CBC: No results for input(s): WBC, NEUTROABS, HGB, HCT, MCV, PLT in the last 168 hours.  CBG: Recent Labs  Lab 12/06/21 2105 12/07/21 0529 12/07/21 1156 12/07/21 1659 12/07/21 2051  GLUCAP 115* 83 100* 61* 123*   Family history.  Mother with diabetes.  Father with diabetes as well as CAD.  Sister  with CVA.  Denies  any colon cancer esophageal cancer or rectal cancer  Brief HPI:   Erika [name] is a 50 y.o. right-handed female with history of right pontine infarction maintained on Plavix and aspirin receiving inpatient rehab services 06/28/2021 to 08/01/2021, CAD with CABG, multiple UTIs right CEA x2 diabetes mellitus fatty liver chronic pain PTSD, diastolic congestive heart failure hyperlipidemia and obesity with BMI 34.15 and history of tobacco use.  Per chart review lives with spouse and daughter who has special needs.  Patient needed moderate assist for mobility using hemiwalker minimal assist for ADLs.  Presented to [facility] 11/21/2021 with worsening cough x1 week denied any fever or chills.  She had a mechanical fall day prior to admission with left foot pain.  Husband reported mental status changes.  In the ED blood pressure 143/83 later 92/56 pulse oxygen saturations 85% on room air rebounded to 96% on 3 L nasal cannula COVID PCR negative.  EKG normal sinus rhythm.  Chest x-ray showed patchy opacities in the left greater than right lung base suspicious for pneumonia.  Left ankle film showed age-indeterminate avulsion fracture from the tip of the medial malleolus.  Left knee films negative.  MRI of the brain no acute intracranial abnormality.  Old right occipital parietal cortical infarct.  Admission chemistries lactic acid 2.1 blood cultures no growth to date urine culture 30,000 multiple species WBC 19,800 procalcitonin 19.24-6.74.  Placed on broad-spectrum antibiotics transition to Levaquin completing course.  She was weaned from oxygen.  Orthopedic services Dr.Poggi follow-up for avulsion fracture medial malleolus left foot conservative care Aircast applied weightbearing as tolerated.  Subcutaneous Lovenox for DVT prophylaxis.  Follow-up psychiatry services for history of major depressive disorder PTSD maintained on Cogentin Klonopin and Lamictal.  Therapy evaluations completed due to patient decreased functional  mobility was admitted for a comprehensive rehab program.   Hospital Course: [facility] was admitted to rehab 11/27/2021 for inpatient therapies to consist of PT, ST and OT at least three hours five days a week. Past admission physiatrist, therapy team and rehab RN have worked together to provide customized collaborative inpatient rehab.  Pertain to patient's debility related to acute hypoxic respiratory failure community-acquired pneumonia.  Antibiotic course completed wean from oxygen.  Subcutaneous Lovenox for DVT prophylaxis.  She remained on aspirin and Plavix for history of CVA.  Chronic pain syndrome with Lyrica hydrocodone as needed as well as Flexeril.  Left foot pain x-rays negative conservative care.  Mood/PTSD Lexapro Lamictal and Thorazine with Cogentin as well as Klonopin with emotional support provided she did receive followed by psychiatry services.  Avulsion fracture medial malleolus left foot status post fall conservative care follow-up orthopedic service Dr.Poggi.  Aircast only as needed Cam walker had been ordered.  Weightbearing as tolerated.  CAD with CABG no chest pain or shortness of breath.  Blood pressure controlled on present regimen no orthostasis.  Blood sugars monitored hemoglobin A1c 7.0 insulin therapy as directed with diabetic teaching.  She exhibited no signs of fluid overload Demadex as directed.  Crestor ongoing for hyperlipidemia.  Bouts of constipation resolved with laxative assistance.  In regards to patient spasticity due to history of CVA there was consideration for Botox as an outpatient she was maintained on baclofen.   Blood pressures were monitored on TID basis and controlled  Diabetes has been monitored with ac/hs CBG checks and SSI was use prn for tighter BS control.    Rehab course: During patient's stay in rehab weekly team conferences  were held to monitor patient's progress, set goals and discuss barriers to discharge. At admission, patient required  moderate assist ambulation 3 feet hemiwalker moderate assist sit to stand  Physical exam.  Blood pressure 147/85 pulse 72 temperature 97.6 respirations 18 oxygen saturation 96% room air Constitutional.  No acute distress HEENT Head.  Normocephalic and atraumatic Eyes.  Pupils round and reactive to light no discharge without nystagmus Neck.  Supple nontender no JVD without thyromegaly Cardiac regular rate rhythm any extra sounds or murmur heard Abdomen.  Soft nontender positive bowel sounds without rebound Respiratory effort normal no respiratory distress without wheeze Skin.  Warm and dry Neurologic.  Cranial nerves II through XII intact motor strength 5/5 in right 3 -/5 in left deltoid bicep tricep grip hip flexor knee extensors ankle dorsi plantarflexion.  Sensation intact.  Tone increased left finger and wrist flexor tone a left hip abductor tone.  He/She  has had improvement in activity tolerance, balance, postural control as well as ability to compensate for deficits. He/She has had improvement in functional use RUE/LUE  and RLE/LLE as well as improvement in awareness.  Patient did need some encouragement at times to participate.  Self propelled her wheelchair using Hemi technique.  Sit to stand from wheelchair with minimal assist light moderate assist.  Sit to bed with minimal assist and perform sit to supine with minimal assist for left lower extremity management.  Cam boot in place and right shoe with total assist.  ADLs sessions focused on functional transfers completed supine to sit with minimal assist needing most assist to elevate trunk and scoot hips to edge of bed.  Total assist to don shoes and cam boot from edge of bed.  Full family teaching completed awaiting plan by husband on what degree of physical assistance he could provide at home considering skilled nursing facility.       Disposition: Discharge to skilled nursing facility    Diet: Diabetic diet  Special  Instructions: No driving smoking or alcohol  Cam boot left lower extremity weightbearing as tolerated  Medications at discharge. 1.  Tylenol as needed 2.  Aspirin 81 mg p.o. daily 3.  Baclofen 10 mg p.o. nightly 4.  Cogentin 0.5 mg p.o. nightly 5.  Coreg 6.25 mg p.o. twice daily 6.  Thorazine 100 mg p.o. nightly 7.  Klonopin 0.5 mg p.o. twice daily 8.  Plavix 75 mg p.o. daily 9.  Vitamin B12 injection 1000 mcg every 30 days 10.  Voltaren 2 g 4 times daily to affected area 11.  Lexapro 20 mg p.o. daily 12.  Zetia 10 mg p.o. daily 13.  Mucinex 600 mg p.o. twice daily 14.  Hydrocodone 1 to 2 tablets every 6 hours as needed pain 15.  Vascepa 2 g p.o. twice daily 16.  Semglee 50 units subcutaneous daily 17.  Imdur 60 mg p.o. twice daily 18.  Lamictal 200 mg p.o. nightly 19.  Nitroglycerin 0.4 mg chest pain as needed 20.  Protonix 40 mg p.o. daily 21.  MiraLAX twice daily hold for loose stools 22.  Klor-Con 20 mg p.o. daily 23.  Lyrica 225 mg p.o. twice daily 24.  Crestor 20 mg p.o. daily 25.  Senokot S2 tabs p.o. twice daily 26.  Aldactone 25 mg p.o. daily 27.  Demadex 10 mg p.o. daily 28.  Trazodone 25 mg p.o. nightly as needed sleep 29.  Vitamin B12 1000 mcg p.o. daily 30.  Trulicity 5.17 mg into the skin weekly 31.  Albuterol inhaler 2  puffs every 6 hours as needed wheezing 32.  Repatha 140 mg 1 dose in the skin every 14 days   30-35 minutes were spent completing discharge summary and discharge planning     Follow-up Information     Raulkar, Clide Deutscher, MD Follow up.   Specialty: Physical Medicine and Rehabilitation Why: Office to call for appointment Contact information: [address] [place] [address] [phone]         Poggi, Marshall Cork, MD Follow up.   Specialty: Orthopedic Surgery Why: call for appointment Contact information: [place] [address] [phone]                  Signed: Lavon Paganini [place] 12/08/2021, 5:30 AM

## 2021-12-07 NOTE — Progress Notes (Addendum)
Speech Language Pathology Weekly Progress and Session Note  Patient Details  Name: Erika [name] MRN: [id number] Date of Birth: 12-21-1971  Beginning of progress report period: 11/28/2021 End of progress report period: 12/07/2021  Today's Date: 12/07/2021 SLP Individual Time: 1345-1430 SLP Individual Time Calculation (min): 45 min  Short Term Goals: Week 1: SLP Short Term Goal 1 (Week 1): Pt will use external aids to organize/recall appointments, daily schedules for family and medical information with min A SLP Short Term Goal 1 - Progress (Week 1): Met SLP Short Term Goal 2 (Week 1): Pt will verbalize 3-4 sequencing steps for transfers/gait training with min A to reduce impulsivity and risk of falls SLP Short Term Goal 2 - Progress (Week 1): Met SLP Short Term Goal 3 (Week 1): Pt will complete moderately complex problem solving tasks with min A SLP Short Term Goal 3 - Progress (Week 1): Met SLP Short Term Goal 4 (Week 1): Pt will participate in selective attention tasks provided min A verbal and visual cues SLP Short Term Goal 4 - Progress (Week 1): Met SLP Short Term Goal 5 (Week 1): Patient will utilize word-finding strategied during high-level structured language tasks with Mod I. SLP Short Term Goal 5 - Progress (Week 1): Met    New Short Term Goals: Week 2: SLP Short Term Goal 1 (Week 2): STG=LTG (ELOS: 12/9)  Weekly Progress Updates:  Patient made very good progress and met all STG's. She is appearing close to cognitive-linguistic baseline and is on track to meet LTG's by anticipated discharge date of 12/19. Patient continues to benefit from skilled SLP intervention to maximize cognitive-linguistic functioning.   Intensity: Minumum of 1-2 x/day, 30 to 90 minutes Frequency: 1 to 3 out of 7 days Duration/Length of Stay: 12/9 Treatment/Interventions: Cognitive remediation/compensation;Cueing hierarchy;Functional tasks;Patient/family education;Therapeutic  Activities;Therapeutic Exercise;Internal/external aids   Daily Session  Skilled Therapeutic Interventions: Patient seen for skilled ST session focusing on cognitive-linguistic goals. She was in bed and awake when SLP entered room. SLP discussed with patient comments she had made with other SLP during a previous visit related to her c/o word finding errors and memory errors. She stated that she has word finding errors during conversation and she will often have to ask her husband, "What word was I trying to say?" She stated that she will lose focus on what she was saying. SLP suggested patient try strategies of taking short (1-2 word) notes during a conversation to help with her forgetting what she is talking about. In addition, SLP suggested patient and husband work together to determine best solution to her word finding errors. SLP also recommended that patient keep calendar, lists, notes, etc to keep up with daily and upcoming events. She continues to require verbal cues to redirect when she becomes slightly tangential during conversation, however her overall attention and awareness have improved. She was left in bed with all needs within reach. She continues to benefit from skilled SLP intervention to maximize cognitive-linguistic function prior to discharge.  General    Pain Pain Assessment Pain Scale: 0-10 Pain Score: 0-No pain Pain Descriptors / Indicators: Aching Pain Frequency: Constant Pain Onset: On-going Patients Stated Pain Goal: 2 Pain Intervention(s): Medication (See eMAR)  Therapy/Group: Individual Therapy  Sonia Baller, MA, CCC-SLP Speech Therapy

## 2021-12-07 NOTE — Progress Notes (Signed)
PROGRESS NOTE   Subjective/Complaints: No issues overnite  ROS:  Pt denies SOB, abd pain, CP, N/V/C/D, and vision changes  Objective:   No results found. No results for input(s): WBC, HGB, HCT, PLT in the last 72 hours.  No results for input(s): NA, K, CL, CO2, GLUCOSE, BUN, CREATININE, CALCIUM in the last 72 hours.    Intake/Output Summary (Last 24 hours) at 12/07/2021 0905 Last data filed at 12/07/2021 0755 Gross per 24 hour  Intake 840 ml  Output --  Net 840 ml         Physical Exam: Vital Signs Blood pressure 130/70, pulse 63, temperature 97.6 F (36.4 C), temperature source Oral, resp. rate 18, height 5\' 7"  (1.702 m), weight 98.9 kg, SpO2 92 %.   General: No acute distress Mood and affect are appropriate Heart: Regular rate and rhythm no rubs murmurs or extra sounds Lungs: Clear to auscultation, breathing unlabored, no rales or wheezes Abdomen: Positive bowel sounds, soft nontender to palpation, nondistended Extremities: No clubbing, cyanosis, or edema Skin: No evidence of breakdown, no evidence of rash  Awake and alert  Neurologic: Cranial nerves II through XII intact, motor strength is 5/5 in RIght and 3-/5 left deltoid, bicep, tricep, grip, hip flexor, knee extensors, ankle dorsiflexor and plantar flexor Sensory exam normal sensation to light touch and proprioception in bilateral upper and lower extremities Tone- increased Left finger and wrist flexor tone, and left hip adductor tone  Musculoskeletal: Left foot medial malleolus tenderness, no pain or swelling RIght knee   Assessment/Plan: 1. Functional deficits which require 3+ hours per day of interdisciplinary therapy in a comprehensive inpatient rehab setting. Physiatrist is providing close team supervision and 24 hour management of active medical problems listed below. Physiatrist and rehab team continue to assess barriers to discharge/monitor  patient progress toward functional and medical goals  Care Tool:  Bathing  Bathing activity did not occur: Refused Body parts bathed by patient: Chest, Abdomen, Front perineal area, Face, Left lower leg, Right lower leg, Left upper leg, Right upper leg, Left arm   Body parts bathed by helper: Right arm, Buttocks     Bathing assist Assist Level: Minimal Assistance - Patient > 75%     Upper Body Dressing/Undressing Upper body dressing Upper body dressing/undressing activity did not occur (including orthotics): Refused What is the patient wearing?: Pull over shirt    Upper body assist Assist Level: Set up assist    Lower Body Dressing/Undressing Lower body dressing    Lower body dressing activity did not occur: Refused What is the patient wearing?: Pants     Lower body assist Assist for lower body dressing: Minimal Assistance - Patient > 75%     Toileting Toileting    Toileting assist Assist for toileting: Moderate Assistance - Patient 50 - 74%     Transfers Chair/bed transfer  Transfers assist     Chair/bed transfer assist level: Minimal Assistance - Patient > 75%     Locomotion Ambulation   Ambulation assist   Ambulation activity did not occur: Safety/medical concerns (Significant BLE weakness, fear-avoidance behavior, L hemi and ankle fx)  Assist level: Minimal Assistance - Patient > 75% Assistive device:  Walker-hemi Max distance: 61'   Walk 10 feet activity   Assist  Walk 10 feet activity did not occur: Safety/medical concerns (Significant BLE weakness, fear-avoidance behavior, L hemi and ankle fx)  Assist level: Minimal Assistance - Patient > 75% Assistive device: Walker-hemi   Walk 50 feet activity   Assist Walk 50 feet with 2 turns activity did not occur: Safety/medical concerns (Significant BLE weakness, fear-avoidance behavior, L hemi and ankle fx)  Assist level: Minimal Assistance - Patient > 75% Assistive device: Walker-hemi    Walk  150 feet activity   Assist Walk 150 feet activity did not occur: Safety/medical concerns (Significant BLE weakness, fear-avoidance behavior, L hemi and ankle fx)         Walk 10 feet on uneven surface  activity   Assist Walk 10 feet on uneven surfaces activity did not occur: Safety/medical concerns (Significant BLE weakness, fear-avoidance behavior, L hemi and ankle fx)         Wheelchair     Assist Is the patient using a wheelchair?: Yes Type of Wheelchair: Manual    Wheelchair assist level: Supervision/Verbal cueing, Set up assist Max wheelchair distance: >200'    Wheelchair 50 feet with 2 turns activity    Assist        Assist Level: Supervision/Verbal cueing   Wheelchair 150 feet activity     Assist      Assist Level: Supervision/Verbal cueing   Blood pressure 130/70, pulse 63, temperature 97.6 F (36.4 C), temperature source Oral, resp. rate 18, height 5\' 7"  (1.702 m), weight 98.9 kg, SpO2 92 %.    Medical Problem List and Plan: 1.  Debility functional deficits secondary to acute hypoxic respiratory failure/community-acquired pneumonia.  Antibiotic therapy completed.  Wean oxygen to room air             -patient may  shower             -ELOS/Goals: 9-12d, sup/minA goals, ELOS 12/19,   Continue CIR- PT, OT and SLP 2.  Impaired mobility, lateral steps: continue Lovenox,             -antiplatelet therapy: Aspirin 81 mg daily, Plavix 75 mg daily 3. Pain Management/chronic pain: continue Lyrica 225 mg twice daily, hydrocodone as needed, Flexeril 10 mg nightly, Advil as needed  C/o of Left foot pain as well as RIgh tknee pain, pt not weight bearing much on left side due to pain  4. Mood/PTSD: Continue Lexapro 20 mg daily, Lamictal 200 mg nightly, Thorazine 100 mg nightly, Cogentin 0.5 mg nightly, Klonopin 0.5 mg twice daily             -antipsychotic agents: [name] 5. Neuropsych: This patient is capable of making decisions on her own behalf. 6.  Skin/Wound Care: Routine skin checks 7. Fluids/Electrolytes/Nutrition: Routine in and outs with follow-up chemistries 8.  Avulsion fracture medial malleolus left foot status post fall.  [facility] orth[facility] service Dr. Roland Rack.  Aircast as needed. CAM walker ordered. Weightbearing as tolerated 9.  History of right pontine infarction.  Patient received CIR July 2022.  Continue aspirin and Plavix 10.  CAD/CABG.  No chest pain or shortness of breath.  Continue Plavix/aspirin 11.  Hypertension.  Elevated, but below goal for her given her ICA stenosis- continue Coreg 6.25 mg twice daily, Imdur 60 mg twice daily, Aldactone 25 mg daily, Demadex 10 mg daily.  Monitor with increased mobility   Vitals:   12/06/21 1935 12/07/21 0507  BP: 116/61 130/70  Pulse:  75 63  Resp: 18 18  Temp: 98.8 F (37.1 C) 97.6 F (36.4 C)  SpO2: 93% 92%  'controlled 12/15 12.  Diabetes mellitus.  Hemoglobin A1c 7.0.  Semglee 54 units daily.  Check blood sugars before meals and at bedtime  12/11- CBGs 110-183- usually under 150- con't regimen CBG (last 3)  Recent Labs    12/06/21 1659 12/06/21 2105 12/07/21 0529  GLUCAP 137* 115* 83    Reduce semglee to 50U- controlled 12/15 13.  Diastolic congestive heart failure.  Continue Demadex 10 mg daily.  Monitor for any signs of fluid overload- no leg swelling noted  Filed Weights   11/27/21 1451  Weight: 98.9 kg    14.  Hyperlipidemia.  Crestor/Zetia 15.  GERD.  Continue Protonix 16.  Constipation.  Continue MiraLAX twice daily, Senokot S2 tablets twice daily  12/11- bowels going QOD- con't regimen 17.  Spasticity related to CVA , may benefit from Botox as Outpt. Increase baclofen to 10mg  HS   LOS: 10 days A FACE TO FACE EVALUATION WAS PERFORMED  Charlett Blake 12/07/2021, 9:05 AM

## 2021-12-07 NOTE — Progress Notes (Addendum)
Patient ID: Erika [name], female   DOB: [birth year], 50 y.o.   MRN: [id number] Left message for [facility] regarding any information about insurance auth for their facility. Await response.  9:33 AM Husband wants to send out FL2 to all facilities and see if anyone can offer a bed and get insurance auth. He feels their daughter can not provide the care pt needs and he has to work. Pt saying she wants to go home today for daughter's birthday and can be placed 12/24/2021 when has different insurance. Have sent FL2 to all SNF's. Husband feels they can not provide the care pt requires, their daughter has health issues of her own and he works. They will have [place] of [place] 12/24/2021 but aware can not stay here until then. Will update once have news regarding information  2;50 PM Spoke with [place], [place] and husband to discuss working on [place] for SNF and pt has only 60 covered days per year for SNF and Rehab and has used 45 as of tomorrow. All aware and still want to move forward with SNF. Await final decision by [employer], told should be today so can trasnfer tomorrow.

## 2021-12-08 ENCOUNTER — Ambulatory Visit: Payer: Managed Care, Other (non HMO) | Admitting: Endocrinology

## 2021-12-08 LAB — GLUCOSE, CAPILLARY: Glucose-Capillary: 77 mg/dL (ref 70–99)

## 2021-12-08 MED ORDER — BENZTROPINE MESYLATE 0.5 MG PO TABS: 0.5000 mg | ORAL_TABLET | Freq: Every day | ORAL | 0 refills | Status: DC

## 2021-12-08 MED ORDER — TRAZODONE HCL 50 MG PO TABS: 25.0000 mg | ORAL_TABLET | Freq: Every evening | ORAL | Status: DC | PRN

## 2021-12-08 MED ORDER — CHLORPROMAZINE HCL 100 MG PO TABS: ORAL_TABLET | ORAL | 0 refills | Status: DC

## 2021-12-08 MED ORDER — CLONAZEPAM 0.5 MG PO TABS: 0.5000 mg | ORAL_TABLET | Freq: Two times a day (BID) | ORAL | 0 refills | Status: DC

## 2021-12-08 MED ORDER — POTASSIUM CHLORIDE 20 MEQ PO PACK: 20.0000 meq | PACK | Freq: Every day | ORAL | 0 refills | Status: DC

## 2021-12-08 MED ORDER — LAMOTRIGINE 200 MG PO TABS: 200.0000 mg | ORAL_TABLET | Freq: Every day | ORAL | 0 refills | Status: DC

## 2021-12-08 MED ORDER — HYDROCODONE-ACETAMINOPHEN 5-325 MG PO TABS
1.0000 | ORAL_TABLET | Freq: Four times a day (QID) | ORAL | 0 refills | Status: DC | PRN
Start: 2021-12-08 — End: 2021-12-24

## 2021-12-08 MED ORDER — BACLOFEN 10 MG PO TABS: 10.0000 mg | ORAL_TABLET | Freq: Every day | ORAL | 0 refills | Status: DC

## 2021-12-08 MED ORDER — PREGABALIN 225 MG PO CAPS: 225.0000 mg | ORAL_CAPSULE | Freq: Two times a day (BID) | ORAL | 0 refills | Status: DC

## 2021-12-08 MED ORDER — INSULIN GLARGINE-YFGN 100 UNIT/ML ~~LOC~~ SOLN: 50.0000 [IU] | Freq: Every day | SUBCUTANEOUS | 11 refills | Status: DC

## 2021-12-08 MED ORDER — DICLOFENAC SODIUM 1 % EX GEL: 2.0000 g | Freq: Four times a day (QID) | CUTANEOUS | Status: DC

## 2021-12-08 NOTE — Progress Notes (Signed)
Occupational Therapy Session Note  Patient Details  Name: Erika [name] MRN: [id number] Date of Birth: 10/24/1971  Today's Date: 12/08/2021 OT Individual Time: 9924-2683 OT Individual Time Calculation (min): 40 min    Short Term Goals: Week 1:  OT Short Term Goal 1 (Week 1): Patient will don UB clothing with set-up assist with use of hemi technique. OT Short Term Goal 1 - Progress (Week 1): Progressing toward goal OT Short Term Goal 2 (Week 1): Patient will complete toilet transfer via squat-pivot and use of hemi-walker. OT Short Term Goal 2 - Progress (Week 1): Met OT Short Term Goal 3 (Week 1): Patient will complete 1/3 parts of LB dressing with Mod A. OT Short Term Goal 3 - Progress (Week 1): Met Week 2:  OT Short Term Goal 1 (Week 2): STGs = LTGs      Skilled Therapeutic Interventions/Progress Updates:    Pt received in room with spouse present. Pt dressed and ready to leave today as she will be transferred to a SNF. Discussed and had pt demonstrate for education with spouse on techniques we have been using for sit to stands, standing balance and transfers for her to utilize with ADLs.  Pt given the small ball she has been practicing with for engagement of LLE. Pt squeezes ball between legs to engage adductors and then rises to stand focusing on forward lean and wt shift. Pt can rise to stand with CGA then use RW.  Had pt demonstrate how she takes small steps with feet to ensure LLE is stable before taking a step.  Also spent a lot of time discussing her mental focus on RLE and how she often "checks out" and allows her R leg to buckle.  Talked about how we cue her to stay focused during her transfers so she can use her R leg effectively.  Reviewed how she has been doing with her self care with toileting, dressing and showers. Encouraged pt and spouse to show these techniques to staff at next facility.  Also encouraged them to have her daughter present frequently so she can become  more comfortable assisting her.   Pt resting in wc with all needs met.  Therapy Documentation Precautions:  Precautions Precautions: Fall Precaution Comments: Hx recurrent falls Required Braces or Orthoses: Other Brace Other Brace: CAM boot on LLE Restrictions Weight Bearing Restrictions: No LLE Weight Bearing: Weight bearing as tolerated Other Position/Activity Restrictions: WBAT LLE with air cast or CAM boot?  Pain: Pain Assessment Pain Scale: 0-10 Pain Score: 6  - R knee, premedicated ADL: ADL Eating: Set up Where Assessed-Eating: Bed level Grooming: Setup Where Assessed-Grooming: Sitting at sink Upper Body Bathing: Minimal assistance Where Assessed-Upper Body Bathing: Shower Lower Body Bathing: Minimal assistance Where Assessed-Lower Body Bathing: Shower Upper Body Dressing: Setup Where Assessed-Upper Body Dressing: Wheelchair Lower Body Dressing: Minimal assistance Where Assessed-Lower Body Dressing: Wheelchair Toileting: Minimal assistance Where Assessed-Toileting: Glass blower/designer: Psychiatric nurse Method: Arts development officer: Grab bars, Raised toilet seat Tub/Shower Transfer: Minimal assistance Tub/Shower Transfer Method: Squat pivot Tub/Shower Equipment: Transfer tub bench, Grab bars   Therapy/Group: Individual Therapy  [place] 12/08/2021, 12:18 PM

## 2021-12-08 NOTE — Progress Notes (Signed)
Patient discharged to [facility]. Report given to [facility]. Patient in no distress at time of discharge. Pain medicine given prior to discharge. Discharge instruction given by [name] PA. No further questions from patient or family.

## 2021-12-08 NOTE — Progress Notes (Signed)
Occupational Therapy Discharge Summary  Patient Details  Name: Erika [name] MRN: [id number] Date of Birth: [birth year]   Patient has met 10 of 10 long term goals due to improved activity tolerance, improved balance, postural control, ability to compensate for deficits, and improved awareness.  Patient to discharge at [facility] Assist level.  Patient's care partner is not available to provide the necessary physical and cognitive assistance at discharge.    Reasons goals not met: n/a  Recommendation:  Patient will benefit from ongoing skilled OT services in skilled nursing facility setting to continue to advance functional skills in the area of BADL and Reduce care partner burden.  Equipment: No equipment provided  Reasons for discharge: treatment goals met  Patient/family agrees with progress made and goals achieved: Yes  OT Discharge Precautions/Restrictions  Precautions Precautions: Fall Precaution Comments: Hx recurrent falls Other Brace: CAM boot on LLE Restrictions Weight Bearing Restrictions: No LLE Weight Bearing: Weight bearing as tolerated  ADL ADL Eating: Set up Where Assessed-Eating: Bed level Grooming: Setup Where Assessed-Grooming: Sitting at sink Upper Body Bathing: Minimal assistance Where Assessed-Upper Body Bathing: Shower Lower Body Bathing: Minimal assistance Where Assessed-Lower Body Bathing: Shower Upper Body Dressing: Setup Where Assessed-Upper Body Dressing: Wheelchair Lower Body Dressing: Minimal assistance Where Assessed-Lower Body Dressing: Wheelchair Toileting: Minimal assistance Where Assessed-Toileting: Glass blower/designer: Psychiatric nurse Method: Arts development officer: Grab bars, Raised toilet seat Tub/Shower Transfer: Minimal assistance Tub/Shower Transfer Method: Engineer, production: Radio broadcast assistant, Grab bars Vision Baseline Vision/History: 1 Wears glasses Patient  Visual Report: Blurring of vision Vision Assessment?: No apparent visual deficits Perception  Perception: Within Functional Limits Praxis Praxis: Intact Cognition Overall Cognitive Status: History of cognitive impairments - at baseline Arousal/Alertness: Awake/alert Orientation Level: Oriented X4 Year: 2022 Month: December Day of Week: Correct Memory Impairment: Retrieval deficit Immediate Memory Recall: Sock;Blue;Bed Memory Recall Sock: Without Cue Memory Recall Blue: Without Cue Memory Recall Bed: Without Cue Sensation Sensation Light Touch: Appears Intact Hot/Cold: Appears Intact Proprioception: Impaired by gross assessment Stereognosis: Impaired by gross assessment Coordination Gross Motor Movements are Fluid and Coordinated: No Fine Motor Movements are Fluid and Coordinated: No Motor  Motor Motor: Hemiplegia Motor - Discharge Observations: L hemi - at baseline Mobility  Transfers Sit to Stand: Contact Guard/Touching assist  Trunk/Postural Assessment  Postural Control Postural Control: Deficits on evaluation Righting Reactions: Delayed; insufficient Protective Responses: Delayed; insufficient  Balance Static Sitting Balance Static Sitting - Level of Assistance: 7: Independent Dynamic Sitting Balance Dynamic Sitting - Level of Assistance: 5: Stand by assistance Static Standing Balance Static Standing - Level of Assistance: 5: Stand by assistance Dynamic Standing Balance Dynamic Standing - Level of Assistance: 4: Min assist Extremity/Trunk Assessment RUE Assessment RUE Assessment: Within Functional Limits LUE Assessment Passive Range of Motion (PROM) Comments: [facility] General Strength Comments: L shoulder 1/5, elbow/wrist/hand 0/5. Brunstrum level for arm: Stage I Presynergy Brunstrum level for hand: Stage I Flaccidity   [name] 12/08/2021, 12:38 PM

## 2021-12-08 NOTE — Progress Notes (Signed)
Speech Language Pathology Discharge Summary  Patient Details  Name: Erika Erika MRN: [id number] Date of Birth: 03-May-1971  Today's Date: 12/08/2021    Patient has met 4 of 4 long term goals.  Patient to discharge at overall Supervision level.  Reasons goals not met: [name]   Clinical Impression/Discharge Summary: Patient made good progress and met all 4 of her LTG's. At time of discharge she was at supervision level for memory, selective attention, complex problem solving. She reported word finding difficulties however this appeared more related to cognitive impairment rather than a true language impairment. Patient was discharged from CIR to SNF as her family is unable to provide level of care and assistance she requires. She would benefit from OP SLP when discharged back home from SNF.  Care Partner:  Caregiver Able to Provide Assistance: No  Type of Caregiver Assistance: Physical;Cognitive  Recommendation:  Outpatient SLP;24 hour supervision/assistance  Rationale for SLP Follow Up: Maximize cognitive function and independence;Reduce caregiver burden   Equipment: none for speech   Reasons for discharge: Discharged from hospital   Patient/Family Agrees with Progress Made and Goals Achieved: Yes    Sonia Baller, MA, CCC-SLP Speech Therapy

## 2021-12-08 NOTE — Progress Notes (Signed)
Inpatient Rehabilitation Discharge Medication Review by a Pharmacist  A complete drug regimen review was completed for this patient to identify any potential clinically significant medication issues.  High Risk Drug Classes Is patient taking? Indication by Medication  Antipsychotic Yes Benztropine, Chlorpromazine, Lamictal for PTSD  Anticoagulant No   Antibiotic No   Opioid Yes Hydrocodone for pain management, L foot, R knee  Antiplatelet Yes ASA, Plavix for PAD, carotid dz, CAD, s/p CVA  Hypoglycemics/insulin Yes Semglee, Trulicity for DM  Vasoactive Medication Yes Coreg, Imdur, Spironolactone, Demadex for CHR, HTN, CAD, s/p CVA  Chemotherapy No   Other No      Type of Medication Issue Identified Description of Issue Recommendation(s)  Drug Interaction(s) (clinically significant)     Duplicate Therapy     Allergy     No Medication Administration End Date     Incorrect Dose     Additional Drug Therapy Needed     Significant med changes from prior encounter (inform family/care partners about these prior to discharge).    Other       Clinically significant medication issues were identified that warrant physician communication and completion of prescribed/recommended actions by midnight of the next day:  No  Name of provider notified for urgent issues identified:   Provider Method of Notification:     Pharmacist comments:   Time spent performing this drug regimen review (minutes):  10-37min  Kaleo Condrey S. Alford Highland, PharmD, BCPS Clinical Staff Pharmacist [email or web address] [place] 12/08/2021 9:26 AM

## 2021-12-08 NOTE — Progress Notes (Signed)
Inpatient Rehabilitation Care Coordinator Discharge Note   Patient Details  Name: Erika [name] MRN: [id number] Date of Birth: [birth year]   Discharge location: GOING TO [facility]-SNF  Length of Stay: 11 DAYS  Discharge activity level: MIN-MOD LEVEL  Home/community participation: ACTIVE  Patient response KG:OVPCHE Literacy - How often do you need to have someone help you when you read instructions, pamphlets, or other written material from your doctor or pharmacy?: Often  Patient response KB:TCYELY Isolation - How often do you feel lonely or isolated from those around you?: Sometimes  Services provided included: MD, RD, PT, OT, SLP, RN, CM, Pharmacy, SW  Financial Services:  Financial Services Utilized: [place] offered to/list presented to: PT AND HUSBAND  Follow-up services arranged:  Other (Comment) (SNF) PT WILL CONTINUE TO SEE HER PSYCHIATRIST AS AN OP SHE HAS BEEN SEEING FOR MED ADJUSTMENT AND COUNSELING           Patient response to transportation need: Is the patient able to respond to transportation needs?: Yes In the past 12 months, has lack of transportation kept you from medical appointments or from getting medications?: No In the past 12 months, has lack of transportation kept you from meetings, work, or from getting things needed for daily living?: No    Comments (or additional information): BOTH PLEASED GOT APPROVAL TO GO TO SNF FEELS NEEDS MORE REHAB AND IS CURRENTLY TOO MUCH CARE FOR DAUGHTER AND HUSBAND. AWARE COVERAGE ENDS 12/30 AND CAN GET RE-APPROVAL 12/24/2021 IF STILL NEEDS SNF  Patient/Family verbalized understanding of follow-up arrangements:  Yes  Individual responsible for coordination of the follow-up plan: [name]-HUSBAND [phone]  Confirmed correct DME delivered: Erika [name] 12/08/2021    Erika [name], [place]

## 2021-12-08 NOTE — Progress Notes (Signed)
Patient ID: Erika [name], female   DOB: 1971-04-01, 50 y.o.   MRN: [id number] Received insurance approval for her to transfer to [facility] today. Husband on the way here and wants to know if can go by car? Team aware and discharge paperwork being completed. Work on discharge today

## 2021-12-08 NOTE — Progress Notes (Signed)
PROGRESS NOTE   Subjective/Complaints: Mildly low CBG yesterday  ROS:  Pt denies SOB, abd pain, CP, N/V/C/D, and vision changes  Objective:   No results found. No results for input(s): WBC, HGB, HCT, PLT in the last 72 hours.  No results for input(s): NA, K, CL, CO2, GLUCOSE, BUN, CREATININE, CALCIUM in the last 72 hours.    Intake/Output Summary (Last 24 hours) at 12/08/2021 0857 Last data filed at 12/08/2021 0759 Gross per 24 hour  Intake 600 ml  Output --  Net 600 ml         Physical Exam: Vital Signs Blood pressure (!) 145/95, pulse 69, temperature 98.3 F (36.8 C), resp. rate 18, height 5\' 7"  (1.702 m), weight 98.9 kg, SpO2 95 %.   General: No acute distress Mood and affect are appropriate Heart: Regular rate and rhythm no rubs murmurs or extra sounds Lungs: Clear to auscultation, breathing unlabored, no rales or wheezes Abdomen: Positive bowel sounds, soft nontender to palpation, nondistended Extremities: No clubbing, cyanosis, or edema Skin: No evidence of breakdown, no evidence of rash  Awake and alert  Neurologic: Cranial nerves II through XII intact, motor strength is 5/5 in RIght and 3-/5 left deltoid, bicep, tricep, grip, hip flexor, knee extensors, ankle dorsiflexor and plantar flexor Sensory exam normal sensation to light touch and proprioception in bilateral upper and lower extremities Tone- increased Left finger and wrist flexor tone, and left hip adductor tone  Musculoskeletal: Left foot medial malleolus tenderness, no pain or swelling RIght knee   Assessment/Plan: 1. Functional deficits due to debility after PNA Stable for D/C today to SNF F/u PCP in 3-4 weeks F/u PM&R 2-3 weeks See D/C summary See D/C instructions   Care Tool:  Bathing  Bathing activity did not occur: Refused Body parts bathed by patient: Chest, Abdomen, Front perineal area, Face, Left lower leg, Right lower leg,  Left upper leg, Right upper leg, Left arm   Body parts bathed by helper: Right arm, Buttocks     Bathing assist Assist Level: Minimal Assistance - Patient > 75%     Upper Body Dressing/Undressing Upper body dressing Upper body dressing/undressing activity did not occur (including orthotics): Refused What is the patient wearing?: Pull over shirt    Upper body assist Assist Level: Set up assist    Lower Body Dressing/Undressing Lower body dressing    Lower body dressing activity did not occur: Refused What is the patient wearing?: Pants     Lower body assist Assist for lower body dressing: Minimal Assistance - Patient > 75%     Toileting Toileting    Toileting assist Assist for toileting: Moderate Assistance - Patient 50 - 74%     Transfers Chair/bed transfer  Transfers assist     Chair/bed transfer assist level: Minimal Assistance - Patient > 75%     Locomotion Ambulation   Ambulation assist   Ambulation activity did not occur: Safety/medical concerns (Significant BLE weakness, fear-avoidance behavior, L hemi and ankle fx)  Assist level: Minimal Assistance - Patient > 75% Assistive device: Walker-hemi Max distance: 67'   Walk 10 feet activity   Assist  Walk 10 feet activity did not occur: Safety/medical  concerns (Significant BLE weakness, fear-avoidance behavior, L hemi and ankle fx)  Assist level: Minimal Assistance - Patient > 75% Assistive device: Walker-hemi   Walk 50 feet activity   Assist Walk 50 feet with 2 turns activity did not occur: Safety/medical concerns (Significant BLE weakness, fear-avoidance behavior, L hemi and ankle fx)  Assist level: Minimal Assistance - Patient > 75% Assistive device: Walker-hemi    Walk 150 feet activity   Assist Walk 150 feet activity did not occur: Safety/medical concerns (Significant BLE weakness, fear-avoidance behavior, L hemi and ankle fx)         Walk 10 feet on uneven surface   activity   Assist Walk 10 feet on uneven surfaces activity did not occur: Safety/medical concerns (Significant BLE weakness, fear-avoidance behavior, L hemi and ankle fx)         Wheelchair     Assist Is the patient using a wheelchair?: Yes Type of Wheelchair: Manual    Wheelchair assist level: Supervision/Verbal cueing, Set up assist Max wheelchair distance: >200'    Wheelchair 50 feet with 2 turns activity    Assist        Assist Level: Supervision/Verbal cueing   Wheelchair 150 feet activity     Assist      Assist Level: Supervision/Verbal cueing   Blood pressure (!) 145/95, pulse 69, temperature 98.3 F (36.8 C), resp. rate 18, height 5\' 7"  (1.702 m), weight 98.9 kg, SpO2 95 %.    Medical Problem List and Plan: 1.  Debility functional deficits secondary to acute hypoxic respiratory failure/community-acquired pneumonia.  Antibiotic therapy completed.     d/c to SNF today  2.  Impaired mobility, lateral steps: continue Lovenox,             -antiplatelet therapy: Aspirin 81 mg daily, Plavix 75 mg daily 3. Pain Management/chronic pain: continue Lyrica 225 mg twice daily, hydrocodone as needed, Flexeril 10 mg nightly, Advil as needed  C/o of Left foot pain as well as RIgh tknee pain, pt not weight bearing much on left side due to pain  4. Mood/PTSD: Continue Lexapro 20 mg daily, Lamictal 200 mg nightly, Thorazine 100 mg nightly, Cogentin 0.5 mg nightly, Klonopin 0.5 mg twice daily             -antipsychotic agents: [name] 5. Neuropsych: This patient is capable of making decisions on her own behalf. 6. Skin/Wound Care: Routine skin checks 7. Fluids/Electrolytes/Nutrition: Routine in and outs with follow-up chemistries 8.  Avulsion fracture medial malleolus left foot status post fall.  [facility] Dr. Roland Rack.  Aircast as needed. CAM walker ordered. Weightbearing as tolerated 9.  History of right pontine infarction.  Patient received CIR  July 2022.  Continue aspirin and Plavix 10.  CAD/CABG.  No chest pain or shortness of breath.  Continue Plavix/aspirin 11.  Hypertension.  Elevated, but below goal for her given her ICA stenosis- continue Coreg 6.25 mg twice daily, Imdur 60 mg twice daily, Aldactone 25 mg daily, Demadex 10 mg daily.  Monitor with increased mobility   Vitals:   12/07/21 1937 12/08/21 0506  BP: (!) 99/59 (!) 145/95  Pulse: 69 69  Resp: 18 18  Temp: 97.7 F (36.5 C) 98.3 F (36.8 C)  SpO2: 96% 95%  'controlled 12/16 12.  Diabetes mellitus.  Hemoglobin A1c 7.0.  Semglee 54 units daily.  Check blood sugars before meals and at bedtime  12/11- CBGs 110-183- usually under 150- con't regimen CBG (last 3)  Recent Labs  12/07/21 1659 12/07/21 2051 12/08/21 0543  GLUCAP 61* 123* 77    Reduce semglee to 50U- controlled 12/16  13.  Diastolic congestive heart failure.  Continue Demadex 10 mg daily.  Monitor for any signs of fluid overload- no leg swelling noted  Filed Weights   11/27/21 1451  Weight: 98.9 kg    14.  Hyperlipidemia.  Crestor/Zetia 15.  GERD.  Continue Protonix 16.  Constipation.  Continue MiraLAX twice daily, Senokot S2 tablets twice daily  12/11- bowels going QOD- con't regimen 17.  Spasticity related to CVA , may benefit from Botox as Outpt. Increase baclofen to 10mg  HS   LOS: 11 days A FACE TO FACE EVALUATION WAS PERFORMED  Charlett Blake 12/08/2021, 8:57 AM

## 2021-12-08 NOTE — Progress Notes (Signed)
Physical Therapy Discharge Summary  Patient Details  Name: Erika [name] MRN: [id number] Date of Birth: 1971/01/18   Patient has met 6 of 6 long term goals due to improved activity tolerance, increased strength, decreased pain, ability to compensate for deficits, and improved coordination.  Patient to discharge at a wheelchair level Supervision.   Patient's care partner requires assistance to provide the necessary physical and cognitive assistance at discharge.    Recommendation:  Patient will benefit from ongoing skilled PT services in skilled nursing facility setting to continue to advance safe functional mobility, address ongoing impairments in global deconditioning, balance, L hemiplegia and minimize fall risk.  Equipment: No equipment provided - all equipment provided during previous CIR stay  Reasons for discharge: treatment goals met and discharge from hospital  Patient/family agrees with progress made and goals achieved: Yes  PT Discharge Precautions/Restrictions Precautions Precautions: Fall Precaution Comments: Hx recurrent falls Required Braces or Orthoses: Other Brace Other Brace: CAM boot on LLE Restrictions Weight Bearing Restrictions: Yes LLE Weight Bearing: Weight bearing as tolerated Pain Interference Pain Interference Pain Effect on Sleep: 1. Rarely or not at all Pain Interference with Therapy Activities: 2. Occasionally Pain Interference with Day-to-Day Activities: 2. Occasionally Vision/Perception  Vision - History Ability to See in Adequate Light: 0 Adequate Perception Perception: Within Functional Limits Praxis Praxis: Intact  Cognition Overall Cognitive Status: History of cognitive impairments - at baseline Arousal/Alertness: Awake/alert Orientation Level: Oriented X4 Year: 2022 Month: December Day of Week: Correct Memory Impairment: Retrieval deficit Immediate Memory Recall: Sock;Blue;Bed Memory Recall Sock: Without Cue Memory Recall  Blue: Without Cue Memory Recall Bed: Without Cue Safety/Judgment: Impaired Comments: Suspected vascular dementia, poor short term memory Sensation Sensation Light Touch: Appears Intact Hot/Cold: Appears Intact Proprioception: Impaired by gross assessment Stereognosis: Impaired by gross assessment Coordination Gross Motor Movements are Fluid and Coordinated: No Fine Motor Movements are Fluid and Coordinated: No Coordination and Movement Description: WFL on R; grossly impaired on L 2/2 residual L hemiplegia and L ankle fx Finger Nose Finger Test: Intact on R; uanble to assess on L 2/2 residual L hemiplegia Motor  Motor Motor: Hemiplegia Motor - Discharge Observations: L hemi - at baseline  Mobility Bed Mobility Bed Mobility: Rolling Right;Rolling Left;Supine to Sit;Sit to Supine;Sitting - Scoot to [employer] of Bed Rolling Right: Supervision/verbal cueing Rolling Left: Supervision/Verbal cueing Supine to Sit: Supervision/Verbal cueing Sitting - Scoot to Edge of Bed: Minimal Assistance - Patient > 75% Sit to Supine: Supervision/Verbal cueing Transfers Transfers: Sit to Stand;Stand to Sit;Stand Pivot Transfers Sit to Stand: Minimal Assistance - Patient > 75% Stand to Sit: Minimal Assistance - Patient > 75% Stand Pivot Transfers: Minimal Assistance - Patient > 75% Stand Pivot Transfer Details: Tactile cues for posture;Tactile cues for weight shifting;Tactile cues for placement Transfer (Assistive device): Hemi-walker Locomotion  Gait Ambulation: Yes Gait Assistance: Minimal Assistance - Patient > 75% Gait Distance (Feet): 54 Feet Assistive device: Hemi-walker Gait Assistance Details: Tactile cues for posture;Tactile cues for weight shifting Gait Gait: Yes Gait Pattern: Impaired Gait Pattern: Step-to pattern;Decreased step length - right;Decreased stance time - left;Decreased dorsiflexion - left;Decreased dorsiflexion - right;Decreased weight shift to left;Right flexed knee in  stance;Left flexed knee in stance;Antalgic;Narrow base of support;Lateral trunk lean to right;Poor foot clearance - left;Poor foot clearance - right Gait velocity: decreased Stairs / Additional Locomotion Stairs: No Pick up small object from the floor (from standing position) activity did not occur: Safety/medical concerns (Significant BLE weakness, fear-avoidance behavior, L hemi and ankle fx) Wheelchair Mobility  Wheelchair Mobility: Yes Wheelchair Assistance: Chartered loss adjuster: Right upper extremity;Right lower extremity Wheelchair Parts Management: Needs assistance Distance: >300'  Trunk/Postural Assessment  Cervical Assessment Cervical Assessment: Within Functional Limits Thoracic Assessment Thoracic Assessment: Exceptions to [facility] (Rounded shoulders) Lumbar Assessment Lumbar Assessment: Exceptions to [facility] (Posterior pelvic tilt) Postural Control Postural Control: Deficits on evaluation Righting Reactions: Delayed; insufficient Protective Responses: Delayed; insufficient  Balance Balance Balance Assessed: Yes Static Sitting Balance Static Sitting - Balance Support: Feet supported;Bilateral upper extremity supported Static Sitting - Level of Assistance: 7: Independent Dynamic Sitting Balance Dynamic Sitting - Balance Support: Right upper extremity supported;Feet supported Dynamic Sitting - Level of Assistance: 5: Stand by assistance Dynamic Sitting - Balance Activities: Lateral lean/weight shifting;Forward lean/weight shifting;Reaching across midline Static Standing Balance Static Standing - Balance Support: During functional activity;Bilateral upper extremity supported Static Standing - Level of Assistance: 4: Min assist Dynamic Standing Balance Dynamic Standing - Balance Support: During functional activity;Bilateral upper extremity supported Dynamic Standing - Level of Assistance: 4: Min assist Dynamic Standing - Balance Activities: Forward  lean/weight shifting;Reaching for objects Extremity Assessment  RLE Assessment RLE Assessment: Exceptions to [facility] RLE Strength RLE Overall Strength: Within Functional Limits for tasks assessed Right Hip Flexion: 4/5 Right Hip Extension: 4/5 Right Hip ABduction: 4/5 Right Hip ADduction: 4/5 Right Knee Flexion: 4/5 Right Knee Extension: 4/5 Right Ankle Dorsiflexion: 4/5 Right Ankle Plantar Flexion: 4/5 LLE Assessment LLE Assessment: Exceptions to WFL LLE Strength LLE Overall Strength: Deficits;Due to pain;Due to premorbid status Left Hip Flexion: 2+/5 Left Hip Extension: 2+/5 Left Hip ABduction: 1/5 Left Hip ADduction: 2/5 Left Knee Flexion: 2/5 Left Knee Extension: 2/5 Left Ankle Dorsiflexion: 0/5 Left Ankle Plantar Flexion: 0/5   Kraven Calk E Kris No, PT, DPT 12/08/2021, 12:52 PM

## 2021-12-08 NOTE — Plan of Care (Signed)
Problem: RH Balance Goal: LTG Patient will maintain dynamic standing balance (PT) Description: LTG:  Patient will maintain dynamic standing balance with assistance during mobility activities (PT) Outcome: Completed/Met   Problem: Sit to Stand Goal: LTG:  Patient will perform sit to stand with assistance level (PT) Description: LTG:  Patient will perform sit to stand with assistance level (PT) Outcome: Completed/Met   Problem: RH Bed Mobility Goal: LTG Patient will perform bed mobility with assist (PT) Description: LTG: Patient will perform bed mobility with assistance, with/without cues (PT). Outcome: Completed/Met   Problem: RH Bed to Chair Transfers Goal: LTG Patient will perform bed/chair transfers w/assist (PT) Description: LTG: Patient will perform bed to chair transfers with assistance (PT). Outcome: Completed/Met   Problem: RH Car Transfers Goal: LTG Patient will perform car transfers with assist (PT) Description: LTG: Patient will perform car transfers with assistance (PT). Outcome: Completed/Met   Problem: RH Ambulation Goal: LTG Patient will ambulate in home environment (PT) Description: LTG: Patient will ambulate in home environment, # of feet with assistance (PT). Outcome: Completed/Met

## 2021-12-08 NOTE — Progress Notes (Signed)
Physical Therapy Session Note  Patient Details  Name: Erika [name] MRN: [id number] Date of Birth: [birth year]  Today's Date: 12/08/2021 PT Individual Time: 0802-0917 PT Individual Time Calculation (min): 75 min   Short Term Goals: Week 1:  PT Short Term Goal 1 (Week 1): Pt will perform sit <>stand w/mod A and LRAD PT Short Term Goal 1 - Progress (Week 1): Progressing toward goal PT Short Term Goal 2 (Week 1): Pt will perform bed <>chair transfer w/mod A and LRAD PT Short Term Goal 2 - Progress (Week 1): Progressing toward goal PT Short Term Goal 3 (Week 1): Pt will ambulate 20' w/mod A and LRAD PT Short Term Goal 3 - Progress (Week 1): Met Week 2:  PT Short Term Goal 1 (Week 2): Pt will ambulate 32'  w/mod A and LRAD PT Short Term Goal 2 (Week 2): Pt will perform sit <>stands w/LRAD and mod A consistently PT Short Term Goal 3 (Week 2): Pt will perform bed <>chair transfers w/LRAD and mod A consistently PT Short Term Goal 4 (Week 2): Pt will perform bed mobility w/CGA for improved independence  Skilled Therapeutic Interventions/Progress Updates:  Pt received supine in bed asleep, very difficult to arouse and remained lethargic throughout session, falling asleep frequently during transfers. Pt reported 7/10 pain in L ankle and had not received pain meds, nursing notified. Pt performed supine <>sit EOB w/min A for trunk support and donned CAM boot w/total A. Sit <>stand w/mod A to [facility] and pt was transported to bathroom via stedy. Doffed pants w/total A and stand <>sit to [facility] w/min A for eccentric control and LUE management. Pt voided continently and performed peri care independently. Sit <>stand from [facility] to [facility] w/min A and donned pants w/total A. Pt transported to [facility] via Stedy and stand <>sit w/min A. Pt required max A to scoot hips posterior in chair. Pt performed upper body dressing while seated in chair w/min A and lower body dressing w/total A, sit <>stand x2 to don/doff pants   w/min A, min A for static standing balance w/HW. Pt transported to ortho gym w/total A for time management and performed sit <>stand pivot to elevated mat w/ 10" step to imitate car transfer w/min A and HW. Pt required assistance w/BLE management in/out of car and lateral weight shift to scoot hips. Sit <>stand pivot from imitation car to [facility] w/min A for LUE management and min tactile cues for lateral weight shift. Stand <>sit in [facility] w/min A for eccentric control and pt transported back to room w/total A 2/2 time management. Pt was left seated in WC in room, husband present, all needs in reach. Nursing notified to administer pain meds.   Therapy Documentation Precautions:  Precautions Precautions: Fall Precaution Comments: Hx recurrent falls Required Braces or Orthoses: Other Brace Other Brace: CAM boot on LLE Restrictions Weight Bearing Restrictions: No LLE Weight Bearing: Weight bearing as tolerated Other Position/Activity Restrictions: WBAT LLE with air cast or CAM boot?   Therapy/Group: Individual Therapy Cruzita Lederer Loralee Weitzman, PT, DPT  12/08/2021, 7:40 AM

## 2021-12-11 ENCOUNTER — Ambulatory Visit: Payer: Managed Care, Other (non HMO)

## 2021-12-11 ENCOUNTER — Encounter: Payer: Self-pay | Admitting: Speech Pathology

## 2021-12-11 LAB — GLUCOSE, CAPILLARY: Glucose-Capillary: 152 mg/dL — ABNORMAL HIGH (ref 70–99)

## 2021-12-11 NOTE — Therapy (Signed)
SPEECH THERAPY DISCHARGE SUMMARY  Visits from Start of Care: 9  Current functional level related to goals / functional outcomes: Goals not met; pt discharged due to hospital and then rehab admission.    Remaining deficits: Cognitive communication deficits persist   Education / Equipment: Use of schedule/planner and visual aids to support safety/recall   Patient agrees to discharge. Patient goals were not met. Patient is being discharged due to a change in medical status.Marland Kitchen   SLP Short Term Goals - 08/04/21 1246       SLP SHORT TERM GOAL #1   Title Pt will verbalize awareness of impulsivity in simple tasks >80% of the time with min cues.    Time 10    Period --   sessions   Status Not Met     SLP SHORT TERM GOAL #2   Title Pt will ID out-of-turn responses >80% with non-verbal cues.    Time 10    Period --   sessions   Status Not Met     SLP SHORT TERM GOAL #3   Title Pt will verbalize steps to safety for transfers/gait training to reduce impulsive/unsafe movement.    Time 10    Period --   sessions   Status Not Met     SLP SHORT TERM GOAL #4   Title Pt will verbalize daily schedule, recent events and salient information using external memory aids.    Time 10    Period --   sessions   Status Not Met     SLP SHORT TERM GOAL #5   Title Generate written or verbal cues to improve accuracy of 3 house hold tasks or safety.    Time 10    Period --   sessions   Status Not Met            SLP Long Term Goals - 08/04/21 1334       SLP LONG TERM GOAL #1   Title Pt will verbalize or write steps prior to task initiation >80% with modified independence.    Time 12    Period Weeks    Status Not Met   Target Date 11/01/21      SLP LONG TERM GOAL #2   Title Pt will demonstrate appropriate turn-taking >90% of the time in 15 minutes mod complex conversation.    Time 12    Period Weeks    Status Not Met   Target Date 11/01/21      SLP LONG TERM GOAL #3   Title Pt will  use external aids to organize/recall appointments and medical information for herself and her daughter.    Time 12    Period Weeks    Status Not Met   Target Date 11/01/21      SLP LONG TERM GOAL #4   Title Pt will plan, organize and present a 20-30 minute lesson on personal or gun safety using external aids/compensations    Time 12    Period Weeks    Status Not Met   Target Date 11/01/21      SLP LONG TERM GOAL #5   Title Patient will use compensations for dysarthria in 20 minute mod complex conversation outside of [place] room for >95% intelligibility.    Time 12    Period Weeks    Status Not Met   Target Date 11/01/21              Erika [name], [place], [place] Speech-Language Pathologist  [facility]  [place] [facility] SERVICES [address] [place], [place], [address] Phone: [phone]   Fax:  [phone]  Patient Details  Name: Erika [name] MRN: [id number] Date of Birth: 12-16-1971 Referring Provider:  No ref. provider found  Encounter Date: 12/11/2021   Erika [name], [place] 12/11/2021, 11:42 AM  [place] [facility] SERVICES [address] [place], [place], [address] Phone: [phone]   Fax:  [phone]

## 2021-12-12 ENCOUNTER — Telehealth: Payer: Self-pay | Admitting: Family Medicine

## 2021-12-12 NOTE — Telephone Encounter (Signed)
Clinical cytogeneticist for [name] called to let us know that this patient has been discharged from the hospital, patient had Pneumonia and a fracture of left tibia  Erika [name] is faxing a treatment plan from [facility]

## 2021-12-21 ENCOUNTER — Other Ambulatory Visit: Payer: Self-pay

## 2021-12-21 ENCOUNTER — Emergency Department (HOSPITAL_COMMUNITY): Payer: Managed Care, Other (non HMO)

## 2021-12-21 ENCOUNTER — Inpatient Hospital Stay (HOSPITAL_COMMUNITY)
Admission: EM | Admit: 2021-12-21 | Discharge: 2021-12-24 | DRG: 177 | Disposition: A | Discharge status: Skilled Nursing Facility | Payer: Managed Care, Other (non HMO) | Source: Skilled Nursing Facility | Attending: Family Medicine | Admitting: Family Medicine

## 2021-12-21 ENCOUNTER — Encounter (HOSPITAL_COMMUNITY): Payer: Self-pay

## 2021-12-21 DIAGNOSIS — D72829 Elevated white blood cell count, unspecified: Secondary | ICD-10-CM | POA: Diagnosis present

## 2021-12-21 DIAGNOSIS — J9601 Acute respiratory failure with hypoxia: Secondary | ICD-10-CM | POA: Diagnosis present

## 2021-12-21 DIAGNOSIS — Z8249 Family history of ischemic heart disease and other diseases of the circulatory system: Secondary | ICD-10-CM

## 2021-12-21 DIAGNOSIS — E871 Hypo-osmolality and hyponatremia: Secondary | ICD-10-CM | POA: Diagnosis present

## 2021-12-21 DIAGNOSIS — Z823 Family history of stroke: Secondary | ICD-10-CM

## 2021-12-21 DIAGNOSIS — S8252XD Displaced fracture of medial malleolus of left tibia, subsequent encounter for closed fracture with routine healing: Secondary | ICD-10-CM

## 2021-12-21 DIAGNOSIS — X58XXXD Exposure to other specified factors, subsequent encounter: Secondary | ICD-10-CM | POA: Diagnosis present

## 2021-12-21 DIAGNOSIS — R739 Hyperglycemia, unspecified: Secondary | ICD-10-CM

## 2021-12-21 DIAGNOSIS — I252 Old myocardial infarction: Secondary | ICD-10-CM

## 2021-12-21 DIAGNOSIS — F410 Panic disorder [episodic paroxysmal anxiety] without agoraphobia: Secondary | ICD-10-CM

## 2021-12-21 DIAGNOSIS — Z87891 Personal history of nicotine dependence: Secondary | ICD-10-CM

## 2021-12-21 DIAGNOSIS — E1165 Type 2 diabetes mellitus with hyperglycemia: Secondary | ICD-10-CM | POA: Diagnosis present

## 2021-12-21 DIAGNOSIS — Z833 Family history of diabetes mellitus: Secondary | ICD-10-CM

## 2021-12-21 DIAGNOSIS — U071 COVID-19: Secondary | ICD-10-CM | POA: Diagnosis not present

## 2021-12-21 DIAGNOSIS — F32A Depression, unspecified: Secondary | ICD-10-CM | POA: Diagnosis present

## 2021-12-21 DIAGNOSIS — F4310 Post-traumatic stress disorder, unspecified: Secondary | ICD-10-CM

## 2021-12-21 DIAGNOSIS — I69354 Hemiplegia and hemiparesis following cerebral infarction affecting left non-dominant side: Secondary | ICD-10-CM

## 2021-12-21 DIAGNOSIS — R6510 Systemic inflammatory response syndrome (SIRS) of non-infectious origin without acute organ dysfunction: Secondary | ICD-10-CM | POA: Diagnosis present

## 2021-12-21 DIAGNOSIS — J1282 Pneumonia due to coronavirus disease 2019: Secondary | ICD-10-CM | POA: Diagnosis present

## 2021-12-21 DIAGNOSIS — K219 Gastro-esophageal reflux disease without esophagitis: Secondary | ICD-10-CM | POA: Diagnosis present

## 2021-12-21 DIAGNOSIS — Z951 Presence of aortocoronary bypass graft: Secondary | ICD-10-CM

## 2021-12-21 DIAGNOSIS — I2510 Atherosclerotic heart disease of native coronary artery without angina pectoris: Secondary | ICD-10-CM | POA: Diagnosis present

## 2021-12-21 DIAGNOSIS — A419 Sepsis, unspecified organism: Secondary | ICD-10-CM | POA: Diagnosis not present

## 2021-12-21 DIAGNOSIS — J9621 Acute and chronic respiratory failure with hypoxia: Secondary | ICD-10-CM

## 2021-12-21 LAB — CBC WITH DIFFERENTIAL/PLATELET
Abs Immature Granulocytes: 0.1 10*3/uL — ABNORMAL HIGH (ref 0.00–0.07)
Basophils Absolute: 0 10*3/uL (ref 0.0–0.1)
Basophils Relative: 0 %
Eosinophils Absolute: 0 10*3/uL (ref 0.0–0.5)
Eosinophils Relative: 0 %
HCT: 41.9 % (ref 36.0–46.0)
Hemoglobin: 13.9 g/dL (ref 12.0–15.0)
Immature Granulocytes: 1 %
Lymphocytes Relative: 9 %
Lymphs Abs: 1.2 10*3/uL (ref 0.7–4.0)
MCH: 28.1 pg (ref 26.0–34.0)
MCHC: 33.2 g/dL (ref 30.0–36.0)
MCV: 84.6 fL (ref 80.0–100.0)
Monocytes Absolute: 0.5 10*3/uL (ref 0.1–1.0)
Monocytes Relative: 3 %
Neutro Abs: 11.9 10*3/uL — ABNORMAL HIGH (ref 1.7–7.7)
Neutrophils Relative %: 87 %
Platelets: 233 10*3/uL (ref 150–400)
RBC: 4.95 MIL/uL (ref 3.87–5.11)
RDW: 14.6 % (ref 11.5–15.5)
WBC: 13.7 10*3/uL — ABNORMAL HIGH (ref 4.0–10.5)
nRBC: 0 % (ref 0.0–0.2)

## 2021-12-21 LAB — COMPREHENSIVE METABOLIC PANEL
ALT: 28 U/L (ref 0–44)
AST: 31 U/L (ref 15–41)
Albumin: 4.2 g/dL (ref 3.5–5.0)
Alkaline Phosphatase: 61 U/L (ref 38–126)
Anion gap: 9 (ref 5–15)
BUN: 15 mg/dL (ref 6–20)
CO2: 29 mmol/L (ref 22–32)
Calcium: 9.7 mg/dL (ref 8.9–10.3)
Chloride: 96 mmol/L — ABNORMAL LOW (ref 98–111)
Creatinine, Ser: 0.96 mg/dL (ref 0.44–1.00)
GFR, Estimated: >60 mL/min (ref >60)
Glucose, Bld: 134 mg/dL — ABNORMAL HIGH (ref 70–99)
Potassium: 3.6 mmol/L (ref 3.5–5.1)
Sodium: 134 mmol/L — ABNORMAL LOW (ref 135–145)
Total Bilirubin: 0.1 mg/dL — ABNORMAL LOW (ref 0.3–1.2)
Total Protein: 7.9 g/dL (ref 6.5–8.1)

## 2021-12-21 LAB — PROTIME-INR
INR: 1.1 (ref 0.8–1.2)
Prothrombin Time: 13.8 seconds (ref 11.4–15.2)

## 2021-12-21 LAB — LACTIC ACID, PLASMA: Lactic Acid, Venous: 1.6 mmol/L (ref 0.5–1.9)

## 2021-12-21 LAB — APTT: aPTT: 30 seconds (ref 24–36)

## 2021-12-21 MED ORDER — SODIUM CHLORIDE 0.9 % IV SOLN: INTRAVENOUS | Status: AC

## 2021-12-21 MED ORDER — SODIUM CHLORIDE 0.9 % IV SOLN
500.0000 mg | INTRAVENOUS | Status: DC
  Administered 2021-12-21 – 2021-12-23 (×3): 500 mg via INTRAVENOUS
  Filled 2021-12-21 (×3): qty 5

## 2021-12-21 MED ORDER — SODIUM CHLORIDE 0.9 % IV SOLN
2.0000 g | INTRAVENOUS | Status: DC
  Administered 2021-12-21 – 2021-12-23 (×3): 2 g via INTRAVENOUS
  Filled 2021-12-21 (×3): qty 20

## 2021-12-21 MED ORDER — SODIUM CHLORIDE 0.9 % IV BOLUS
1000.0000 mL | Freq: Once | INTRAVENOUS | Status: AC
  Administered 2021-12-21: 1000 mL via INTRAVENOUS

## 2021-12-21 NOTE — ED Provider Notes (Signed)
[facility] EMERGENCY DEPARTMENT Provider Note   CSN: [id number] Arrival date & time: 12/21/21  2142     History Chief Complaint  Patient presents with   Altered Mental Status    Erika [name] is a 50 y.o. female.   Altered Mental Status  This patient is a 50 year old female, she has a history of multiple coronary artery disease status post bypass grafting in 2017, she has also had her carotids cleaned out in 2017 with a carotid endarterectomy.  She has known diastolic dysfunction, she has had a prior stroke that left her with left-sided hemiparesis of her arm and partial paresis of her leg.  She is currently at a nursing facility.  She is also known type II diabetic.  She presents to the hospital today from the nursing facility with a complaint of possible altered mental status.  It is not clear exactly what that means the patient is able to give me an extremely clear history of everything that is going on in her past and her current state.  She is able to tell me where her husband is who he works for and that she was made somewhat upset by him today when she talked to him on the phone.  She feels like she may have had a fever, there was a report that there was possibly some foul-smelling urine and incontinence which is abnormal for her otherwise there was also a complaint that she may have had more "word salad" which is her baseline but worse than usual.  The patient states that she has absolutely no pain no shortness of breath no nausea or vomiting no diarrhea and no other complaints at this time  Past Medical History:  Diagnosis Date   Arthralgia of temporomandibular joint    CAD, multiple vessel    a. 06/2016 Cath: ostLM 40%, ostLAD 40%, pLAD 95%, ost-pLCx 60%, pLCx 95%, mLCx 60%, mRCA 95%, D2 50%, LVSF nl;  b. 07/2016 CABG x 4 (LIMA->LAD, VG->Diag, VG->OM, VG->RCA); c. 08/2016 Cath: 3VD w/ 4/4 patent grafts. LAD distal to LIMA has diff dzs->Med rx; d. 08/2020 Cath: 4/4 patent  grafts, native 3VD. EF 55-65%-->Med Rx.   Carotid arterial disease ([place])    a. 07/2016 s/p R CEA; b. 02/2021 U/S: RICA 17-49%, LICA 4-49%.   Clotting disorder ([place])    Depression    Diastolic dysfunction    a. 06/2016 Echo: EF 50-55%, mild inf wall HK, GR1DD, mild MR, RV sys fxn nl, mildly dilated LA, PASP nl   Fatty liver disease, nonalcoholic 6759   History of blood transfusion    with heart surgery   HLD (hyperlipidemia)    Labile hypertension    a. prior renal ngiogram negative for RAS in 03/2016; b. catecholamines and metanephrines normal, mildly elevated renin with normal aldosterone and normal ratio in 02/2016   Myocardial infarction [facility]) 2017   Obesity    PAD (peripheral artery disease) ([place])    a. 09/2018 s/p L SFA stenting; b. 07/2019 Periph Angio: Patent m/d L SFA stent w/ 100% L SFA distal to stent. L AT 100d, L Peroneal diff dzs-->Med Rx; c. 02/2021 ABIs: stable @ 0.61 on R and 0.46 on L.   PTSD (post-traumatic stress disorder)    Tobacco abuse    Type 2 diabetes mellitus ([place]) 12/2015    Patient Active Problem List   Diagnosis Date Noted   Major depressive disorder, recurrent episode, mild ([place]) 11/26/2021   Ankle fracture, left    Closed avulsion  fracture of medial malleolus of left tibia    CAP (community acquired pneumonia) 11/21/2021   Cerebrovascular accident (CVA) due to occlusion of vertebral artery ([place]) 09/11/2021   Cognitive change 09/11/2021   Cognitive communication deficit 08/10/2021   Urinary incontinence 08/10/2021   GERD (gastroesophageal reflux disease) 08/03/2021   Chronic post-traumatic stress disorder (PTSD)    Depression    Right pontine stroke ([place]) 06/28/2021   Hemiplegia and hemiparesis following cerebral infarction affecting left non-dominant side ([place]) 06/24/2021   Chronic diastolic CHF (congestive heart failure) ([place]) 06/24/2021   Fall 06/24/2021   Abnormal LFTs 06/24/2021   Olecranon bursitis of left elbow 06/12/2021   Chronic hip pain  (Bilateral) 06/09/2021   Chronic use of opiate for therapeutic purpose 05/09/2021   Greater trochanteric bursitis of hip (Left) 03/09/2021   Bursitis of hip (Left) 29/93/7169   Uncomplicated opioid dependence ([place]) 02/20/2021   Acute conjunctivitis of left eye 01/02/2021   Unintentional weight loss 11/21/2020   Broken teeth (Right) 08/02/2020   History of MI (myocardial infarction) (July 2017) 08/02/2020   Neuropathy 06/09/2020   Dental abscess 06/09/2020   Foot drop (Left) 06/09/2020   Carotid stenosis, asymptomatic, right 04/27/2020   Chronic migraine 11/03/2019   Thrombocytosis 09/16/2019   Erythrocytosis 09/16/2019   Hypercalcemia 09/06/2019   Leukocytosis 09/06/2019   Polycythemia 09/06/2019   Chronic hip pain (Left) 08/27/2019   Osteoarthritis of hip (Left) 08/27/2019   Gluteal tendonitis of buttock (Left) 08/27/2019   Radial nerve palsy (Right) 07/15/2019   Neuropathy of radial nerve (Right) 06/30/2019   Abnormal bruising 06/25/2019   History of carotid endarterectomy (Right) 03/04/2019   Pain medication agreement signed 03/04/2019   Atypical facial pain (Right) 01/27/2019   Chronic ear pain (Right) 01/27/2019   Chronic jaw pain (Right) 01/27/2019   Geniculate Neuralgia (Right) 01/27/2019   Vitamin D deficiency 01/19/2019   Neurogenic pain 01/19/2019   Chronic anticoagulation (PLAVIX) 01/19/2019   Chronic pain syndrome 01/07/2019   Long term current use of opiate analgesic 01/07/2019   Long term prescription benzodiazepine use 01/07/2019   Pharmacologic therapy 01/07/2019   Disorder of skeletal system 01/07/2019   Problems influencing health status 01/07/2019   Chronic headaches (1ry area of Pain) (Right) 01/07/2019   Opiate use 09/24/2018   PVD (peripheral vascular disease) ([place]) 06/24/2018   Chronic ankle pain (Bilateral) 12/27/2017   Tendinopathy of gluteus medius (Right) 12/27/2017   Tendinopathy of gluteus medius (Left) 12/27/2017   Bilateral hip pain  12/26/2017   Chronic elbow pain (Left) 10/17/2017   Elevated troponin I level 05/21/2017   Major depressive disorder, recurrent episode, moderate (HCC) 11/19/2016   Insomnia 10/30/2016   Constipation 07/25/2016   S/P CABG x 4 07/06/2016   Bradycardia    CAD (coronary artery disease)    Carotid stenosis    CAD in native artery 06/29/2016   Elevated troponin 06/28/2016   Essential hypertension, malignant 06/28/2016   Mild tobacco abuse in early remission 06/28/2016   Essential hypertension    Malignant hypertension    Type 2 diabetes mellitus with other specified complication (HCC)    Chest pain with high risk for cardiac etiology 06/27/2016   NSTEMI (non-ST elevated myocardial infarction) ([place]) 06/27/2016   Proteinuria 03/15/2016   Renal artery stenosis ([place]) 03/15/2016   MDD (major depressive disorder) 10/17/2015   Agoraphobia with panic attacks 04/25/2015   HTN (hypertension), malignant 10/20/2013   Debility 04/02/2013   Cluster headache 03/20/2012   HLD (hyperlipidemia) 07/26/2010   ADJUSTMENT DISORDER WITH  MIXED FEATURES 07/26/2010    Past Surgical History:  Procedure Laterality Date   ABDOMINAL AORTOGRAM W/LOWER EXTREMITY [name] 10/15/2018   Procedure: ABDOMINAL AORTOGRAM W/LOWER EXTREMITY;  Surgeon: Wellington Hampshire, MD;  Location: [place] [facility];  Service: Cardiovascular;  Laterality: [name];   ABDOMINAL AORTOGRAM W/LOWER EXTREMITY Bilateral 08/19/2019   Procedure: ABDOMINAL AORTOGRAM W/LOWER EXTREMITY;  Surgeon: Wellington Hampshire, MD;  Location: [place] [facility];  Service: Cardiovascular;  Laterality: Bilateral;   CARDIAC CATHETERIZATION [name] 06/29/2016   Procedure: Left Heart Cath and Coronary Angiography;  Surgeon: Minna Merritts, MD;  Location: [place] [facility];  Service: Cardiovascular;  Laterality: [name];   CARDIAC CATHETERIZATION [name] 08/29/2016   Procedure: Left Heart Cath and Cors/Grafts Angiography;  Surgeon: Wellington Hampshire, MD;  Location: [place] [facility];   Service: Cardiovascular;  Laterality: [name];   CESAREAN SECTION     CHOLECYSTECTOMY     CORONARY ARTERY BYPASS GRAFT [name] 07/06/2016   Procedure: CORONARY ARTERY BYPASS GRAFTING (CABG) x four, using left internal mammary artery and right leg greater saphenous vein harvested endoscopically;  Surgeon: Ivin Poot, MD;  Location: [place];  Service: Open Heart Surgery;  Laterality: [name];   ENDARTERECTOMY Right 07/06/2016   Procedure: ENDARTERECTOMY CAROTID;  Surgeon: Rosetta Posner, MD;  Location: [facility] OR;  Service: Vascular;  Laterality: Right;   ENDARTERECTOMY Right 04/27/2020   Procedure: REDO OF RIGHT ENDARTERECTOMY CAROTID;  Surgeon: Rosetta Posner, MD;  Location: [facility] OR;  Service: Vascular;  Laterality: Right;   LEFT HEART CATH AND CORS/GRAFTS ANGIOGRAPHY [name] 08/24/2020   Procedure: LEFT HEART CATH AND CORS/GRAFTS ANGIOGRAPHY;  Surgeon: Wellington Hampshire, MD;  Location: [place] [facility];  Service: Cardiovascular;  Laterality: [name];   PERIPHERAL VASCULAR CATHETERIZATION [name] 04/18/2016   Procedure: Renal Angiography;  Surgeon: Wellington Hampshire, MD;  Location: [place] [facility];  Service: Cardiovascular;  Laterality: [name];   PERIPHERAL VASCULAR INTERVENTION Left 10/15/2018   Procedure: PERIPHERAL VASCULAR INTERVENTION;  Surgeon: Wellington Hampshire, MD;  Location: [place] [facility];  Service: Cardiovascular;  Laterality: Left;  Left superficial femoral   TEE WITHOUT CARDIOVERSION [name] 07/06/2016   Procedure: TRANSESOPHAGEAL ECHOCARDIOGRAM (TEE);  Surgeon: Ivin Poot, MD;  Location: [place];  Service: Open Heart Surgery;  Laterality: [name];   TONSILLECTOMY       OB History   No obstetric history on file.     Family History  Adopted: Yes  Problem Relation Age of Onset   Diabetes Mother    Diabetes Father    Alcohol abuse Father    Heart disease Father    Drug abuse Father    Stroke Sister    Anxiety disorder Sister     Social History   Tobacco Use   Smoking status: Former    Packs/day: 0.50     Years: 27.00    Pack years: 13.50    Types: Cigarettes   Smokeless tobacco: Never  Vaping Use   Vaping Use: Every day   Substances: Flavoring  Substance Use Topics   Alcohol use: Not Currently    Alcohol/week: 0.0 standard drinks    Comment: socially   Drug use: No    Home Medications Prior to Admission medications   Medication Sig Start Date End Date Taking? Authorizing Provider  acetaminophen (TYLENOL) 325 MG tablet Take 1-2 tablets (325-650 mg total) by mouth every 4 (four) hours as needed for mild pain. 08/01/21   Bary Leriche, PA-C  albuterol (PROVENTIL  HFA;VENTOLIN HFA) 108 (90 Base) MCG/ACT inhaler Inhale 2 puffs into the lungs every 6 (six) hours as needed for wheezing. 03/02/19 11/21/22  Lucille Passy, MD  aspirin 81 MG chewable tablet Chew 1 tablet (81 mg total) by mouth daily. 06/29/21   Enzo Bi, MD  baclofen (LIORESAL) 10 MG tablet Take 1 tablet (10 mg total) by mouth at bedtime. 12/08/21   Angiulli, Lavon Paganini, PA-C  benztropine (COGENTIN) 0.5 MG tablet Take 1 tablet (0.5 mg total) by mouth at bedtime. 12/08/21 12/08/22  Angiulli, Lavon Paganini, PA-C  carvedilol (COREG) 6.25 MG tablet Take 1 tablet (6.25 mg total) by mouth 2 (two) times daily. 08/22/21   Wellington Hampshire, MD  chlorproMAZINE (THORAZINE) 100 MG tablet TAKE 1 TABLET BY MOUTH EVERYDAY AT BEDTIME 12/08/21   Angiulli, Lavon Paganini, PA-C  clonazePAM (KLONOPIN) 0.5 MG tablet Take 1 tablet (0.5 mg total) by mouth 2 (two) times daily. 12/08/21   Angiulli, Lavon Paganini, PA-C  clopidogrel (PLAVIX) 75 MG tablet Take 1 tablet (75 mg total) by mouth daily. 09/04/21   Wellington Hampshire, MD  cyanocobalamin (,VITAMIN B-12,) 1000 MCG/ML injection Inject 1 mL (1,000 mcg total) into the skin every 30 (thirty) days. 08/29/21   Love, Ivan Anchors, PA-C  diclofenac Sodium (VOLTAREN) 1 % GEL Apply 2 g topically 4 (four) times daily. 12/08/21   Angiulli, Lavon Paganini, PA-C  Dulaglutide (TRULICITY) 4.26 ST/4.1DQ SOPN Inject 0.75 mg into the skin once a week.  03/21/21   Renato Shin, MD  escitalopram (LEXAPRO) 20 MG tablet Take 1 tablet (20 mg total) by mouth daily. 11/14/21 11/14/22  Arfeen, Arlyce Harman, MD  Evolocumab (REPATHA SURECLICK) 222 MG/ML SOAJ Inject 1 Dose into the skin every 14 (fourteen) days. 09/07/21   Hilty, Nadean Corwin, MD  ezetimibe (ZETIA) 10 MG tablet Take 1 tablet (10 mg total) by mouth daily. 09/04/21   Wellington Hampshire, MD  guaiFENesin (MUCINEX) 600 MG 12 hr tablet Take 1 tablet (600 mg total) by mouth 2 (two) times daily. 08/01/21   Love, Ivan Anchors, PA-C  HYDROcodone-acetaminophen (NORCO/VICODIN) 5-325 MG tablet Take 1-2 tablets by mouth every 6 (six) hours as needed for severe pain or moderate pain. 12/08/21   Angiulli, Lavon Paganini, PA-C  icosapent Ethyl (VASCEPA) 1 g capsule Take 2 capsules (2 g total) by mouth 2 (two) times daily. 09/07/21   Hilty, Nadean Corwin, MD  insulin glargine-yfgn (SEMGLEE) 100 UNIT/ML injection Inject 0.5 mLs (50 Units total) into the skin daily. 12/08/21   Angiulli, Lavon Paganini, PA-C  isosorbide mononitrate (IMDUR) 60 MG 24 hr tablet TAKE ONE TABLET BY MOUTH TWICE A DAY 09/25/21   Wellington Hampshire, MD  lamoTRIgine (LAMICTAL) 200 MG tablet Take 1 tablet (200 mg total) by mouth at bedtime. 12/08/21   Angiulli, Lavon Paganini, PA-C  nitroGLYCERIN (NITROSTAT) 0.4 MG SL tablet Place 1 tablet (0.4 mg total) under the tongue every 5 (five) minutes as needed for chest pain. 03/15/21   Wellington Hampshire, MD  pantoprazole (PROTONIX) 40 MG tablet Take 1 tablet (40 mg total) by mouth daily. 11/28/21   Raulkar, Clide Deutscher, MD  polyethylene glycol powder (GLYCOLAX/MIRALAX) 17 GM/SCOOP powder MIX 1 CAPFUL IN 8 OUNCES OF FLUID BY MOUTH TWICE A DAY 10/17/21   Raulkar, Clide Deutscher, MD  potassium chloride (KLOR-CON) 20 MEQ packet Take 20 mEq by mouth daily. 12/08/21   Angiulli, Lavon Paganini, PA-C  pregabalin (LYRICA) 225 MG capsule Take 1 capsule (225 mg total) by mouth 2 (two) times  daily. 12/08/21   Angiulli, Lavon Paganini, PA-C  rosuvastatin (CRESTOR) 20 MG  tablet Take 1 tablet (20 mg total) by mouth daily. 08/01/21   Love, Ivan Anchors, PA-C  senna-docusate (SENOKOT-S) 8.6-50 MG tablet Take 2 tablets by mouth 2 (two) times daily. 08/01/21   Love, Ivan Anchors, PA-C  spironolactone (ALDACTONE) 25 MG tablet TAKE ONE TABLET BY MOUTH ONE TIME DAILY 08/25/21   Raulkar, Clide Deutscher, MD  torsemide (DEMADEX) 10 MG tablet Take 1 tablet (10 mg total) by mouth daily. PLEASE SCHEDULE OFFICE VISIT FOR FURTHER REFILLS. THANK YOU! 10/09/21 11/08/21  Wellington Hampshire, MD  traZODone (DESYREL) 50 MG tablet Take 0.5 tablets (25 mg total) by mouth at bedtime as needed for sleep. 12/08/21   Angiulli, Lavon Paganini, PA-C  vitamin B-12 1000 MCG tablet Take 1 tablet (1,000 mcg total) by mouth daily. 06/29/21   Enzo Bi, MD    Allergies    Chantix [varenicline tartrate]  Review of Systems   Review of Systems  All other systems reviewed and are negative.  Physical Exam Updated Vital Signs BP 99/66    Pulse (!) 103    Temp (!) 102.2 F (39 C) (Oral)    Resp 18    SpO2 92%   Physical Exam Vitals and nursing note reviewed.  Constitutional:      General: She is not in acute distress.    Appearance: She is well-developed.  HENT:     Head: Normocephalic and atraumatic.     Mouth/Throat:     Pharynx: No oropharyngeal exudate.  Eyes:     General: No scleral icterus.       Right eye: No discharge.        Left eye: No discharge.     Conjunctiva/sclera: Conjunctivae normal.     Pupils: Pupils are equal, round, and reactive to light.  Neck:     Thyroid: No thyromegaly.     Vascular: No JVD.  Cardiovascular:     Rate and Rhythm: Normal rate and regular rhythm.     Heart sounds: Normal heart sounds. No murmur heard.   No friction rub. No gallop.  Pulmonary:     Effort: Pulmonary effort is normal. No respiratory distress.     Breath sounds: Normal breath sounds. No wheezing or rales.  Abdominal:     General: Bowel sounds are normal. There is no distension.     Palpations: Abdomen is  soft. There is no mass.     Tenderness: There is no abdominal tenderness.  Musculoskeletal:        General: No tenderness. Normal range of motion.     Cervical back: Normal range of motion and neck supple.  Lymphadenopathy:     Cervical: No cervical adenopathy.  Skin:    General: Skin is warm and dry.     Findings: No erythema or rash.  Neurological:     Mental Status: She is alert.     Coordination: Coordination normal.     Comments: Left arm completely hemiparetic, left leg generally weak but able to move, right side is normal, speech is totally clear, there is no word salad, no word finding difficulties no aphasia either expressive or receptive, no slurred speech, slight left-sided facial droop  Psychiatric:        Behavior: Behavior normal.    ED Results / Procedures / Treatments   Labs (all labs ordered are listed, but only abnormal results are displayed) Labs Reviewed  CBC WITH DIFFERENTIAL/PLATELET - Abnormal; Notable  for the following components:      Result Value   WBC 13.7 (*)    Neutro Abs 11.9 (*)    Abs Immature Granulocytes 0.10 (*)    All other components within normal limits  COMPREHENSIVE METABOLIC PANEL - Abnormal; Notable for the following components:   Sodium 134 (*)    Chloride 96 (*)    Glucose, Bld 134 (*)    Total Bilirubin 0.1 (*)    All other components within normal limits  URINE CULTURE  RESP PANEL BY RT-PCR (FLU A&B, COVID) ARPGX2  CULTURE, BLOOD (ROUTINE X 2)  CULTURE, BLOOD (ROUTINE X 2)  LACTIC ACID, PLASMA  PROTIME-INR  APTT  URINALYSIS, ROUTINE W REFLEX MICROSCOPIC  LACTIC ACID, PLASMA    EKG None  Radiology DG Chest Port 1 View  Result Date: 12/21/2021 CLINICAL DATA:  Sepsis EXAM: PORTABLE CHEST 1 VIEW COMPARISON:  11/21/2021 FINDINGS: The heart size and mediastinal contours are within normal limits. Both lungs are clear. The visualized skeletal structures are unremarkable. IMPRESSION: No active disease. Electronically Signed    By: Ulyses Jarred M.D.   On: 12/21/2021 23:08    Procedures .Critical Care Performed by: Noemi Chapel, MD Authorized by: Noemi Chapel, MD   Critical care provider statement:    Critical care time (minutes):  30   Critical care time was exclusive of:  Separately billable procedures and treating other patients and teaching time   Critical care was necessary to treat or prevent imminent or life-threatening deterioration of the following conditions:  Sepsis   Critical care was time spent personally by me on the following activities:  Development of treatment plan with patient or surrogate, discussions with consultants, evaluation of patient's response to treatment, examination of patient, ordering and review of laboratory studies, ordering and review of radiographic studies, ordering and performing treatments and interventions, pulse oximetry, re-evaluation of patient's condition, review of old charts and obtaining history from patient or surrogate   I assumed direction of critical care for this patient from another provider in my specialty: no     Care discussed with: admitting provider   Comments:         Medications Ordered in ED Medications  0.9 %  sodium chloride infusion ( Intravenous New Bag/Given 12/21/21 2236)  cefTRIAXone (ROCEPHIN) 2 g in sodium chloride 0.9 % 100 mL IVPB (2 g Intravenous New Bag/Given 12/21/21 2202)  azithromycin (ZITHROMAX) 500 mg in sodium chloride 0.9 % 250 mL IVPB (500 mg Intravenous New Bag/Given 12/21/21 2239)    ED Course  I have reviewed the triage vital signs and the nursing notes.  Pertinent labs & imaging results that were available during my care of the patient were reviewed by me and considered in my medical decision making (see chart for details).  Clinical Course as of 12/21/21 2326  Thu Dec 21, 2021  2159 After initial evaluation it was found that the patient was actually febrile to 102.2, oxygen of 87% on room air, pulse is tachycardic at  110, sepsis evaluation to ensue [BM]    Clinical Course User Index [BM] Noemi Chapel, MD   MDM Rules/Calculators/A&P                          The patient seems to be at her baseline, I do not see any acute findings on exam of concern, will check urinalysis labs, CXR and covid.  Patient agreeable No signs of altered MS at this time.  Initial laboratory data shows a lactic acid of 1.6, INR of 1.1, white blood cell count of 13.7 with a neutrophil predominance and a metabolic panel that shows no signs of renal failure and essentially normal electrolytes.  Urinalysis pending at this time  I have personally viewed and interpreted the chest x-ray, I see no signs of pneumonia, no pneumothorax, I agree with radiology interpretation  At the time of change of shift - pt has been given some IVF, Given antibiotics and IVF for some hypotension. Change of shift - care signed out to Dr. Sedonia Small.     Final Clinical Impression(s) / ED Diagnoses Final diagnoses:  Sepsis, due to unspecified organism, unspecified whether acute organ dysfunction present [facility])     Noemi Chapel, MD 12/21/21 2326

## 2021-12-21 NOTE — ED Triage Notes (Signed)
Pt arrived via [facility] from [place]. Pt has left side deficit from previous stroke along with noted word salad. Pt feels that she is more altered than normal at present.

## 2021-12-21 NOTE — ED Provider Notes (Signed)
°  Provider Note MRN:  [id number]  Arrival date & time: 12/22/21    ED Course and Medical Decision Making  Assumed care from Dr. Sabra Heck at shift change.  Sepsis, presumed urinary source still awaiting urinalysis.  Febrile, tachycardic, soft blood pressure.  On my assessment seems well-perfused, conversant, still with some soft pressures receiving more crystalloid at this point will monitor closely.  Blood pressure remains soft but not worsening, patient is on new oxygen requirement.  Patient is positive for COVID.  Will admit to medicine.  .Critical Care Performed by: Maudie Flakes, MD Authorized by: Maudie Flakes, MD   Critical care provider statement:    Critical care time (minutes):  33   Critical care was necessary to treat or prevent imminent or life-threatening deterioration of the following conditions:  Sepsis   Critical care was time spent personally by me on the following activities:  Development of treatment plan with patient or surrogate, discussions with consultants, evaluation of patient's response to treatment, examination of patient, ordering and review of laboratory studies, ordering and review of radiographic studies, ordering and performing treatments and interventions, pulse oximetry, re-evaluation of patient's condition and review of old charts   I assumed direction of critical care for this patient from another provider in my specialty: yes    Final Clinical Impressions(s) / ED Diagnoses     ICD-10-CM   1. Sepsis, due to unspecified organism, unspecified whether acute organ dysfunction present ([place])  A41.9     2. COVID-19  U07.1       ED Discharge Orders     None       Discharge Instructions   None     Barth Kirks. Sedonia Small, [place] [email or web address]    Maudie Flakes, MD 12/22/21 [id number]

## 2021-12-22 DIAGNOSIS — D72829 Elevated white blood cell count, unspecified: Secondary | ICD-10-CM | POA: Diagnosis not present

## 2021-12-22 DIAGNOSIS — I10 Essential (primary) hypertension: Secondary | ICD-10-CM

## 2021-12-22 DIAGNOSIS — E785 Hyperlipidemia, unspecified: Secondary | ICD-10-CM

## 2021-12-22 DIAGNOSIS — U071 COVID-19: Principal | ICD-10-CM | POA: Diagnosis present

## 2021-12-22 DIAGNOSIS — J9601 Acute respiratory failure with hypoxia: Secondary | ICD-10-CM | POA: Diagnosis not present

## 2021-12-22 DIAGNOSIS — R739 Hyperglycemia, unspecified: Secondary | ICD-10-CM | POA: Diagnosis not present

## 2021-12-22 DIAGNOSIS — I2510 Atherosclerotic heart disease of native coronary artery without angina pectoris: Secondary | ICD-10-CM

## 2021-12-22 DIAGNOSIS — F4310 Post-traumatic stress disorder, unspecified: Secondary | ICD-10-CM

## 2021-12-22 DIAGNOSIS — E119 Type 2 diabetes mellitus without complications: Secondary | ICD-10-CM

## 2021-12-22 DIAGNOSIS — F32A Depression, unspecified: Secondary | ICD-10-CM

## 2021-12-22 LAB — URINALYSIS, ROUTINE W REFLEX MICROSCOPIC
Bilirubin Urine: NEGATIVE
Glucose, UA: NEGATIVE mg/dL
Hgb urine dipstick: NEGATIVE
Ketones, ur: NEGATIVE mg/dL
Leukocytes,Ua: NEGATIVE
Nitrite: NEGATIVE
Protein, ur: NEGATIVE mg/dL
Specific Gravity, Urine: 1.016 (ref 1.005–1.030)
pH: 5 (ref 5.0–8.0)

## 2021-12-22 LAB — RESP PANEL BY RT-PCR (FLU A&B, COVID) ARPGX2
Influenza A by PCR: NEGATIVE
Influenza B by PCR: NEGATIVE
SARS Coronavirus 2 by RT PCR: POSITIVE — AB

## 2021-12-22 LAB — PROCALCITONIN: Procalcitonin: 1.89 ng/mL

## 2021-12-22 LAB — CBC WITH DIFFERENTIAL/PLATELET
Abs Immature Granulocytes: 0.12 10*3/uL — ABNORMAL HIGH (ref 0.00–0.07)
Basophils Absolute: 0.1 10*3/uL (ref 0.0–0.1)
Basophils Relative: 0 %
Eosinophils Absolute: 0.1 10*3/uL (ref 0.0–0.5)
Eosinophils Relative: 0 %
HCT: 36 % (ref 36.0–46.0)
Hemoglobin: 11.7 g/dL — ABNORMAL LOW (ref 12.0–15.0)
Immature Granulocytes: 1 %
Lymphocytes Relative: 7 %
Lymphs Abs: 1.2 10*3/uL (ref 0.7–4.0)
MCH: 28.4 pg (ref 26.0–34.0)
MCHC: 32.5 g/dL (ref 30.0–36.0)
MCV: 87.4 fL (ref 80.0–100.0)
Monocytes Absolute: 0.4 10*3/uL (ref 0.1–1.0)
Monocytes Relative: 3 %
Neutro Abs: 15.5 10*3/uL — ABNORMAL HIGH (ref 1.7–7.7)
Neutrophils Relative %: 89 %
Platelets: 208 10*3/uL (ref 150–400)
RBC: 4.12 MIL/uL (ref 3.87–5.11)
RDW: 14.9 % (ref 11.5–15.5)
WBC: 17.3 10*3/uL — ABNORMAL HIGH (ref 4.0–10.5)
nRBC: 0 % (ref 0.0–0.2)

## 2021-12-22 LAB — MAGNESIUM: Magnesium: 1.7 mg/dL (ref 1.7–2.4)

## 2021-12-22 LAB — LACTIC ACID, PLASMA: Lactic Acid, Venous: 1 mmol/L (ref 0.5–1.9)

## 2021-12-22 LAB — GLUCOSE, CAPILLARY
Glucose-Capillary: 172 mg/dL — ABNORMAL HIGH (ref 70–99)
Glucose-Capillary: 142 mg/dL — ABNORMAL HIGH (ref 70–99)

## 2021-12-22 LAB — FERRITIN: Ferritin: 86 ng/mL (ref 11–307)

## 2021-12-22 LAB — PHOSPHORUS: Phosphorus: 2.5 mg/dL (ref 2.5–4.6)

## 2021-12-22 LAB — CBG MONITORING, ED
Glucose-Capillary: 159 mg/dL — ABNORMAL HIGH (ref 70–99)
Glucose-Capillary: 226 mg/dL — ABNORMAL HIGH (ref 70–99)

## 2021-12-22 LAB — C-REACTIVE PROTEIN: CRP: 11 mg/dL — ABNORMAL HIGH (ref <1.0)

## 2021-12-22 LAB — D-DIMER, QUANTITATIVE: D-Dimer, Quant: 0.77 ug/mL-FEU — ABNORMAL HIGH (ref 0.00–0.50)

## 2021-12-22 LAB — STREP PNEUMONIAE URINARY ANTIGEN: Strep Pneumo Urinary Antigen: NEGATIVE

## 2021-12-22 MED ORDER — PANTOPRAZOLE SODIUM 40 MG PO TBEC
40.0000 mg | DELAYED_RELEASE_TABLET | Freq: Every day | ORAL | Status: DC
  Administered 2021-12-22 – 2021-12-24 (×3): 40 mg via ORAL
  Filled 2021-12-22 (×3): qty 1

## 2021-12-22 MED ORDER — ACETAMINOPHEN 325 MG PO TABS: 650.0000 mg | ORAL_TABLET | Freq: Four times a day (QID) | ORAL | Status: DC | PRN

## 2021-12-22 MED ORDER — INSULIN ASPART 100 UNIT/ML IJ SOLN
0.0000 [IU] | Freq: Three times a day (TID) | INTRAMUSCULAR | Status: DC
Start: 2021-12-22 — End: 2021-12-24
  Administered 2021-12-22: 18:00:00 3 [IU] via SUBCUTANEOUS
  Administered 2021-12-22 – 2021-12-23 (×2): 5 [IU] via SUBCUTANEOUS
  Administered 2021-12-23: 8 [IU] via SUBCUTANEOUS
  Administered 2021-12-23: 3 [IU] via SUBCUTANEOUS
  Administered 2021-12-24: 11 [IU] via SUBCUTANEOUS
  Administered 2021-12-24: 3 [IU] via SUBCUTANEOUS
  Filled 2021-12-22: qty 1

## 2021-12-22 MED ORDER — ENOXAPARIN SODIUM 40 MG/0.4ML IJ SOSY
40.0000 mg | PREFILLED_SYRINGE | INTRAMUSCULAR | Status: DC
  Administered 2021-12-22 – 2021-12-24 (×3): 40 mg via SUBCUTANEOUS
  Filled 2021-12-22 (×3): qty 0.4

## 2021-12-22 MED ORDER — ALBUTEROL SULFATE HFA 108 (90 BASE) MCG/ACT IN AERS
2.0000 | INHALATION_SPRAY | Freq: Four times a day (QID) | RESPIRATORY_TRACT | Status: DC | PRN
  Administered 2021-12-23 (×2): 2 via RESPIRATORY_TRACT
  Filled 2021-12-22: qty 6.7

## 2021-12-22 MED ORDER — SODIUM CHLORIDE 0.9 % IV BOLUS
500.0000 mL | Freq: Once | INTRAVENOUS | Status: AC
  Administered 2021-12-22: 04:00:00 500 mL via INTRAVENOUS

## 2021-12-22 MED ORDER — INSULIN ASPART 100 UNIT/ML IJ SOLN
0.0000 [IU] | Freq: Every day | INTRAMUSCULAR | Status: DC
  Administered 2021-12-23: 3 [IU] via SUBCUTANEOUS

## 2021-12-22 MED ORDER — ASPIRIN 81 MG PO CHEW
81.0000 mg | CHEWABLE_TABLET | Freq: Every day | ORAL | Status: DC
  Administered 2021-12-22 – 2021-12-24 (×3): 81 mg via ORAL
  Filled 2021-12-22 (×3): qty 1

## 2021-12-22 MED ORDER — CLOPIDOGREL BISULFATE 75 MG PO TABS
75.0000 mg | ORAL_TABLET | Freq: Every day | ORAL | Status: DC
  Administered 2021-12-22 – 2021-12-24 (×3): 75 mg via ORAL
  Filled 2021-12-22 (×3): qty 1

## 2021-12-22 MED ORDER — DEXAMETHASONE 4 MG PO TABS
6.0000 mg | ORAL_TABLET | ORAL | Status: DC
  Administered 2021-12-22 – 2021-12-24 (×3): 6 mg via ORAL
  Filled 2021-12-22 (×3): qty 2

## 2021-12-22 MED ORDER — DM-GUAIFENESIN ER 30-600 MG PO TB12
1.0000 | ORAL_TABLET | Freq: Two times a day (BID) | ORAL | Status: DC
  Administered 2021-12-22 – 2021-12-24 (×5): 1 via ORAL
  Filled 2021-12-22 (×5): qty 1

## 2021-12-22 MED ORDER — INSULIN GLARGINE-YFGN 100 UNIT/ML ~~LOC~~ SOLN
10.0000 [IU] | Freq: Every day | SUBCUTANEOUS | Status: DC
  Administered 2021-12-22 – 2021-12-23 (×2): 10 [IU] via SUBCUTANEOUS
  Filled 2021-12-22 (×3): qty 0.1

## 2021-12-22 MED ORDER — PREGABALIN 75 MG PO CAPS
225.0000 mg | ORAL_CAPSULE | Freq: Two times a day (BID) | ORAL | Status: DC
  Administered 2021-12-22 – 2021-12-24 (×4): 225 mg via ORAL
  Filled 2021-12-22 (×4): qty 3

## 2021-12-22 MED ORDER — ALBUTEROL SULFATE HFA 108 (90 BASE) MCG/ACT IN AERS: 2.0000 | INHALATION_SPRAY | Freq: Four times a day (QID) | RESPIRATORY_TRACT | Status: DC

## 2021-12-22 MED ORDER — LAMOTRIGINE 100 MG PO TABS
200.0000 mg | ORAL_TABLET | Freq: Every day | ORAL | Status: DC
  Administered 2021-12-22 – 2021-12-23 (×2): 200 mg via ORAL
  Filled 2021-12-22 (×2): qty 2

## 2021-12-22 MED ORDER — VITAMIN B-12 1000 MCG PO TABS: 1000.0000 ug | ORAL_TABLET | Freq: Every day | ORAL | Status: DC

## 2021-12-22 MED ORDER — ASCORBIC ACID 500 MG PO TABS
500.0000 mg | ORAL_TABLET | Freq: Every day | ORAL | Status: DC
  Administered 2021-12-22 – 2021-12-24 (×3): 500 mg via ORAL
  Filled 2021-12-22 (×3): qty 1

## 2021-12-22 MED ORDER — ICOSAPENT ETHYL 1 G PO CAPS: 2.0000 g | ORAL_CAPSULE | Freq: Two times a day (BID) | ORAL | Status: DC

## 2021-12-22 MED ORDER — ZINC SULFATE 220 (50 ZN) MG PO CAPS
220.0000 mg | ORAL_CAPSULE | Freq: Every day | ORAL | Status: DC
  Administered 2021-12-22 – 2021-12-24 (×3): 220 mg via ORAL
  Filled 2021-12-22 (×3): qty 1

## 2021-12-22 MED ORDER — ROSUVASTATIN CALCIUM 20 MG PO TABS: 20.0000 mg | ORAL_TABLET | Freq: Every day | ORAL | Status: DC

## 2021-12-22 MED ORDER — DEXAMETHASONE SODIUM PHOSPHATE 10 MG/ML IJ SOLN
6.0000 mg | Freq: Once | INTRAMUSCULAR | Status: AC
  Administered 2021-12-22: 01:00:00 6 mg via INTRAVENOUS
  Filled 2021-12-22: qty 1

## 2021-12-22 MED ORDER — NIRMATRELVIR/RITONAVIR (PAXLOVID)TABLET
3.0000 | ORAL_TABLET | Freq: Two times a day (BID) | ORAL | Status: DC
  Administered 2021-12-22 – 2021-12-24 (×4): 3 via ORAL
  Filled 2021-12-22: qty 30

## 2021-12-22 NOTE — ED Notes (Signed)
Changed pt urine canister and placed pt on a new brief

## 2021-12-22 NOTE — Sepsis Progress Note (Signed)
Following per sepsis protocol   

## 2021-12-22 NOTE — Evaluation (Signed)
Occupational Therapy Evaluation Patient Details Name: Erika [name] MRN: [id number] DOB: 12-31-1970 Today's Date: 12/22/2021   History of Present Illness Erika [name] is a 50 y.o. female with medical history significant for prior stroke with residual left-sided hemiparesis of her arm and partial paresis of her leg,  coronary artery disease s/p bypass grafting, carotid artery disease s/p CEA, diastolic dysfunction, depression, PAD and PTSD who presents to the emergency department via EMS due to reported altered mental status.  Patient's mental status appeared to be at baseline on arrival to the ED, patient endorsed increased urinary frequency within last couple of days, but she denies burning sensation on urination or any other irritative bladder symptoms.  Patient endorsed subjective fever, but denies chills, cough, chest congestion or nasal congestion.   Clinical Impression   Pt agreeable to OT/PT co-evaluation. Pt present with L hemiplegia from previous stroke and has been getting rehab at a SNF prior to arrival at the hospital. Pt demonstrates no functional use of L UE. Pt required min to mod A for bed mobility and was able to stand at EOB and take forward and backwards steps with PT with min A using RW with R UE only. Pt demonstrates poor standing balance. Pt is assisted at baseline minimally for ADL's. Pt left in bed with call bell within reach. Pt will benefit from continued OT in the hospital and recommended venue below to increase strength, balance, and endurance for safe ADL's.        Recommendations for follow up therapy are one component of a multi-disciplinary discharge planning process, led by the attending physician.  Recommendations may be updated based on patient status, additional functional criteria and insurance authorization.   Follow Up Recommendations  Skilled nursing-short term rehab (<3 hours/day)    Assistance Recommended at Discharge Intermittent  Supervision/Assistance  Functional Status Assessment  Patient has had a recent decline in their functional status and demonstrates the ability to make significant improvements in function in a reasonable and predictable amount of time.  Equipment Recommendations  None recommended by OT    Recommendations for Other Services       Precautions / Restrictions Precautions Precautions: Fall Precaution Comments: Hx recurrent falls Required Braces or Orthoses: Other Brace Other Brace: CAM boot on LLE Restrictions Weight Bearing Restrictions: Yes LLE Weight Bearing: Weight bearing as tolerated Other Position/Activity Restrictions: WBAT LLE with CAM boot      Mobility Bed Mobility Overal bed mobility: Needs Assistance Bed Mobility: Supine to Sit;Sit to Supine     Supine to sit: Min assist;Mod assist Sit to supine: Min assist;Mod assist   General bed mobility comments: slow labored movement    Transfers Overall transfer level: Needs assistance Equipment used: Rolling walker (2 wheels) Transfers: Sit to/from Stand;Bed to chair/wheelchair/BSC Sit to Stand: Min assist     Step pivot transfers: Min assist     General transfer comment: increased time, labored movement. Pt using RW only with R UE to simulate Hemi-walker use.      Balance Overall balance assessment: Needs assistance Sitting-balance support: Feet supported;Single extremity supported Sitting balance-Leahy Scale: Fair Sitting balance - Comments: seated at EOB   Standing balance support: Reliant on assistive device for balance;During functional activity;Single extremity supported Standing balance-Leahy Scale: Poor Standing balance comment: fair/poor supporting self with RUE using RW                           ADL either performed or  assessed with clinical judgement   ADL Overall ADL's : Needs assistance/impaired     Grooming: Minimal assistance;Moderate assistance;Sitting   Upper Body Bathing:  Minimal assistance;Sitting   Lower Body Bathing: Minimal assistance;Moderate assistance;Sitting/lateral leans;With adaptive equipment   Upper Body Dressing : Minimal assistance;Sitting   Lower Body Dressing: Moderate assistance;Sitting/lateral leans;With adaptive equipment   Toilet Transfer: Minimal assistance;Stand-pivot;Rolling walker (2 wheels) Toilet Transfer Details (indicate cue type and reason): Partially simulated via sit to stand from EOB with forward and backwards ambulation. Toileting- Clothing Manipulation and Hygiene: Moderate assistance;Minimal assistance;Sitting/lateral lean       Functional mobility during ADLs: Minimal assistance General ADL Comments: Per clinical judgement.     Vision Baseline Vision/History: 1 Wears glasses (reading) Ability to See in Adequate Light: 0 Adequate Patient Visual Report: No change from baseline Vision Assessment?: No apparent visual deficits                Pertinent Vitals/Pain Pain Assessment: Faces Faces Pain Scale: Hurts little more Pain Location: LUE with end range movement Pain Descriptors / Indicators: Grimacing;Guarding;Sore Pain Intervention(s): Limited activity within patient's tolerance;Monitored during session;Repositioned     Hand Dominance Right   Extremity/Trunk Assessment Upper Extremity Assessment Upper Extremity Assessment: RUE deficits/detail;LUE deficits/detail RUE Deficits / Details: WFL per pt use of R UE during bed mobility and with RW for sit to stand and ambulation. RUE Sensation: WNL RUE Coordination: WNL LUE Deficits / Details: 2-/5 at shoulder. 1/5 elbow, 0 to 1 /5 for digits and wrist.  Mild tone flexor tone noted in elbow. Moderate flexor tone in wrist and digits. Pt does not use L UE functionally. LUE Sensation: decreased proprioception LUE Coordination: decreased fine motor;decreased gross motor   Lower Extremity Assessment Lower Extremity Assessment: Defer to PT evaluation LLE Deficits /  Details: grossly 3+/5 except foot not tested due to CAM boot LLE: Unable to fully assess due to immobilization LLE Sensation: WNL LLE Coordination: decreased fine motor;decreased gross motor   Cervical / Trunk Assessment Cervical / Trunk Assessment: Normal   Communication Communication Communication: No difficulties   Cognition Arousal/Alertness: Awake/alert Behavior During Therapy: WFL for tasks assessed/performed Overall Cognitive Status: Within Functional Limits for tasks assessed                                                        Home Living Family/patient expects to be discharged to:: Private residence Living Arrangements: Spouse/significant other;Children Available Help at Discharge: Family;Available PRN/intermittently Type of Home: House Home Access: Ramped entrance     Home Layout: One level     Bathroom Shower/Tub: Teacher, early years/pre: Standard (toilet riser) Bathroom Accessibility: Yes (with hemi-walker) How Accessible: Accessible via walker Home Equipment: Tub bench;Wheelchair - manual;Cane - single point;Other (comment);Rolling Walker (2 wheels)   Additional Comments: Not accessible via wc  Lives With: Spouse;Daughter    Prior Functioning/Environment Prior Level of Function : Needs assist       Physical Assist : Mobility (physical);ADLs (physical) Mobility (physical): Bed mobility;Transfers;Gait ADLs (physical): Bathing;Dressing Mobility Comments: household ambulator using Hemi-walker ADLs Comments: Pt requires Min A from family for ADL's including bathing and dressing.        OT Problem List: Decreased strength;Decreased range of motion;Decreased activity tolerance;Impaired balance (sitting and/or standing);Decreased coordination;Impaired UE functional use      OT Treatment/Interventions:  Self-care/ADL training;Therapeutic exercise;Therapeutic activities;Neuromuscular education;Manual therapy;Splinting;DME  and/or AE instruction;Patient/family education;Balance training    OT Goals(Current goals can be found in the care plan section) Acute Rehab OT Goals Patient Stated Goal: return to SNF for rehab OT Goal Formulation: With patient Time For Goal Achievement: 01/05/22 Potential to Achieve Goals: Fair  OT Frequency: Min 2X/week               Co-evaluation PT/OT/SLP Co-Evaluation/Treatment: Yes Reason for Co-Treatment: To address functional/ADL transfers PT goals addressed during session: Mobility/safety with mobility;Balance;Proper use of DME OT goals addressed during session: ADL's and self-care                       End of Session Equipment Utilized During Treatment: Rolling walker (2 wheels)  Activity Tolerance: Patient tolerated treatment well Patient left: in bed;with call bell/phone within reach  OT Visit Diagnosis: Unsteadiness on feet (R26.81);Other abnormalities of gait and mobility (R26.89);Muscle weakness (generalized) (M62.81);Hemiplegia and hemiparesis Hemiplegia - Right/Left: Left Hemiplegia - dominant/non-dominant: Non-Dominant Hemiplegia - caused by:  (Previous CVA)                Time: 5631-4970 OT Time Calculation (min): 22 min Charges:  OT General Charges $OT Visit: 1 Visit OT Evaluation $OT Eval Moderate Complexity: 1 Mod  Riggins Cisek OT, MOT  [employer] 12/22/2021, 11:44 AM

## 2021-12-22 NOTE — Plan of Care (Signed)
°  Problem: Acute Rehab PT Goals(only PT should resolve) Goal: Pt Will Go Supine/Side To Sit Outcome: Progressing Flowsheets (Taken 12/22/2021 1055) Pt will go Supine/Side to Sit: with minimal assist Goal: Patient Will Transfer Sit To/From Stand Outcome: Progressing Flowsheets (Taken 12/22/2021 1055) Patient will transfer sit to/from stand: with minimal assist Goal: Pt Will Transfer Bed To Chair/Chair To Bed Outcome: Progressing Flowsheets (Taken 12/22/2021 1055) Pt will Transfer Bed to Chair/Chair to Bed: with min assist Goal: Pt Will Ambulate Outcome: Progressing Flowsheets (Taken 12/22/2021 1055) Pt will Ambulate:  25 feet  with minimal assist  with other equipment (comment) Note: Hemi-walker   10:56 AM, 12/22/21 Lonell Grandchild, MPT Physical Therapist with [facility] [phone] office [phone] mobile phone

## 2021-12-22 NOTE — Plan of Care (Signed)
°  Problem: Acute Rehab OT Goals (only OT should resolve) Goal: Pt. Will Perform Grooming Flowsheets (Taken 12/22/2021 1147) Pt Will Perform Grooming:  standing  with min guard assist  with adaptive equipment Goal: Pt. Will Perform Upper Body Dressing Flowsheets (Taken 12/22/2021 1147) Pt Will Perform Upper Body Dressing:  with modified independence  with adaptive equipment  sitting Goal: Pt. Will Perform Lower Body Dressing Flowsheets (Taken 12/22/2021 1147) Pt Will Perform Lower Body Dressing:  with modified independence  sitting/lateral leans  sit to/from stand  with adaptive equipment Goal: Pt. Will Transfer To Toilet Flowsheets (Taken 12/22/2021 1147) Pt Will Transfer to Toilet:  with modified independence  stand pivot transfer Goal: Pt/Caregiver Will Perform Home Exercise Program Flowsheets (Taken 12/22/2021 1147) Pt/caregiver will Perform Home Exercise Program:  Increased ROM  Left upper extremity  With minimal assist  Lavonne Kinderman OT, MOT

## 2021-12-22 NOTE — Evaluation (Signed)
Physical Therapy Evaluation Patient Details Name: Erika [name] MRN: [id number] DOB: March 06, 1971 Today's Date: 12/22/2021  History of Present Illness  Erika [name] is a 50 y.o. female with medical history significant for prior stroke with residual left-sided hemiparesis of her arm and partial paresis of her leg,  coronary artery disease s/p bypass grafting, carotid artery disease s/p CEA, diastolic dysfunction, depression, PAD and PTSD who presents to the emergency department via EMS due to reported altered mental status.  Patient's mental status appeared to be at baseline on arrival to the ED, patient endorsed increased urinary frequency within last couple of days, but she denies burning sensation on urination or any other irritative bladder symptoms.  Patient endorsed subjective fever, but denies chills, cough, chest congestion or nasal congestion.   Clinical Impression  Patient demonstrates slow labored movement for sitting up at bedside requiring Min/mod assist to pull self to sitting, once on feet able to take a few steps forward/backwards at bedside supporting self with RUE holding onto RW without loss of balance and limited mostly due to fatigue.  Patient put back to bed after therapy with IV to RUE inadvertently dislodged - RN notified.  Patient will benefit from continued skilled physical therapy in hospital and recommended venue below to increase strength, balance, endurance for safe ADLs and gait.         Recommendations for follow up therapy are one component of a multi-disciplinary discharge planning process, led by the attending physician.  Recommendations may be updated based on patient status, additional functional criteria and insurance authorization.  Follow Up Recommendations Skilled nursing-short term rehab (<3 hours/day)    Assistance Recommended at Discharge Intermittent Supervision/Assistance  Functional Status Assessment Patient has had a recent decline in  their functional status and demonstrates the ability to make significant improvements in function in a reasonable and predictable amount of time.  Equipment Recommendations  None recommended by PT    Recommendations for Other Services       Precautions / Restrictions Precautions Precautions: Fall Required Braces or Orthoses: Other Brace Other Brace: CAM boot on LLE Restrictions Weight Bearing Restrictions: Yes LLE Weight Bearing: Weight bearing as tolerated Other Position/Activity Restrictions: WBAT LLE with CAM boot      Mobility  Bed Mobility Overal bed mobility: Needs Assistance Bed Mobility: Supine to Sit;Sit to Supine     Supine to sit: Min assist;Mod assist Sit to supine: Min assist;Mod assist   General bed mobility comments: slow labored movement    Transfers Overall transfer level: Needs assistance Equipment used: Rolling walker (2 wheels) Transfers: Sit to/from Stand;Bed to chair/wheelchair/BSC Sit to Stand: Min assist   Step pivot transfers: Min assist       General transfer comment: increased time, labored movement    Ambulation/Gait Ambulation/Gait assistance: Mod assist Gait Distance (Feet): 10 Feet Assistive device: Rolling walker (2 wheels) Gait Pattern/deviations: Decreased step length - right;Decreased step length - left;Decreased stance time - left Gait velocity: decreased     General Gait Details: limited to a few slow labored steps forward/backwards at bedside using holding onto RW with right hand  Stairs            Wheelchair Mobility    Modified Rankin (Stroke Patients Only)       Balance Overall balance assessment: Needs assistance Sitting-balance support: Feet supported;Single extremity supported Sitting balance-Leahy Scale: Fair Sitting balance - Comments: seated at EOB   Standing balance support: Reliant on assistive device for balance;During functional activity;Single extremity supported  Standing balance-Leahy Scale:  Poor Standing balance comment: fair/poor supporting self with RUE using RW                             Pertinent Vitals/Pain Pain Assessment: Faces Faces Pain Scale: Hurts little more Pain Location: LUE with end range movement Pain Descriptors / Indicators: Grimacing;Guarding;Sore Pain Intervention(s): Limited activity within patient's tolerance;Monitored during session;Repositioned    Home Living Family/patient expects to be discharged to:: Private residence Living Arrangements: Spouse/significant other;Children Available Help at Discharge: Family;Available PRN/intermittently Type of Home: House Home Access: Ramped entrance       Home Layout: One level Home Equipment: Tub bench;Wheelchair - manual;Cane - single point;Other (comment);Rolling Walker (2 wheels)      Prior Function Prior Level of Function : Needs assist       Physical Assist : Mobility (physical);ADLs (physical) Mobility (physical): Bed mobility;Transfers;Gait   Mobility Comments: household ambulator using Hemi-walker ADLs Comments: assisted by family     Hand Dominance   Dominant Hand: Right    Extremity/Trunk Assessment   Upper Extremity Assessment Upper Extremity Assessment: Defer to OT evaluation    Lower Extremity Assessment Lower Extremity Assessment: Generalized weakness;LLE deficits/detail LLE Deficits / Details: grossly 3+/5 except foot not tested due to CAM boot LLE: Unable to fully assess due to immobilization LLE Sensation: WNL LLE Coordination: decreased fine motor;decreased gross motor    Cervical / Trunk Assessment Cervical / Trunk Assessment: Normal  Communication   Communication: No difficulties  Cognition Arousal/Alertness: Awake/alert Behavior During Therapy: WFL for tasks assessed/performed Overall Cognitive Status: Within Functional Limits for tasks assessed                                          General Comments      Exercises      Assessment/Plan    PT Assessment Patient needs continued PT services  PT Problem List Decreased strength;Decreased range of motion;Decreased activity tolerance;Decreased balance;Decreased mobility;Decreased coordination       PT Treatment Interventions DME instruction;Gait training;Stair training;Functional mobility training;Therapeutic activities;Therapeutic exercise;Balance training;Neuromuscular re-education;Patient/family education    PT Goals (Current goals can be found in the Care Plan section)  Acute Rehab PT Goals Patient Stated Goal: return home after rehab PT Goal Formulation: With patient Time For Goal Achievement: 01/05/22 Potential to Achieve Goals: Good    Frequency Min 3X/week   Barriers to discharge        Co-evaluation PT/OT/SLP Co-Evaluation/Treatment: Yes Reason for Co-Treatment: To address functional/ADL transfers;Complexity of the patient's impairments (multi-system involvement) PT goals addressed during session: Mobility/safety with mobility;Balance;Proper use of DME         AM-PAC PT "6 Clicks" Mobility  Outcome Measure Help needed turning from your back to your side while in a flat bed without using bedrails?: A Little Help needed moving from lying on your back to sitting on the side of a flat bed without using bedrails?: A Lot Help needed moving to and from a bed to a chair (including a wheelchair)?: A Little Help needed standing up from a chair using your arms (e.g., wheelchair or bedside chair)?: A Little Help needed to walk in hospital room?: A Lot Help needed climbing 3-5 steps with a railing? : A Lot 6 Click Score: 15    End of Session   Activity Tolerance: Patient tolerated treatment well;Patient limited by  fatigue Patient left: in bed;with call bell/phone within reach Nurse Communication: Mobility status PT Visit Diagnosis: Unsteadiness on feet (R26.81);Other abnormalities of gait and mobility (R26.89);Muscle weakness (generalized)  (M62.81)    Time: 6122-4497 PT Time Calculation (min) (ACUTE ONLY): 31 min   Charges:   PT Evaluation $PT Eval Moderate Complexity: 1 Mod PT Treatments $Therapeutic Activity: 23-37 mins        10:53 AM, 12/22/21 Lonell Grandchild, MPT Physical Therapist with [facility] [phone] office [phone] mobile phone

## 2021-12-22 NOTE — Progress Notes (Signed)
Patient seen and evaluated, chart reviewed, please see EMR for updated orders. Please see full H&P dictated by admitting physician Dr. Josephine Cables for same date of service.   Brief Summary:- 50 y.o. female with medical history significant for prior stroke with residual left-sided hemiparesis of her arm and partial paresis of her leg,  coronary artery disease s/p bypass grafting, carotid artery disease s/p CEA, diastolic dysfunction, depression, PAD and PTSD admitted with acute hypoxic respiratory failure in setting of COVID-19 infection and discharged back to facility    A/p 1)SIRS--- pathophysiology--chest x-ray and UA not suggestive of infection PCT 1.89 WBC 13.7>>17.3 Lactic acid 1.6 >> 1.0 -Continue Rocephin and azithromycin -Patient does Not meet sepsis criteria  2)Covid 19 +ve, Flu neg--- chest x-ray without acute findings COVID-19 Labs Recent Labs    12/22/21 0505  DDIMER 0.77*  FERRITIN 86  CRP 11.0*  - Rx with Paxlovid, vitamin C zinc and symptomatic and supportive treatment  3)Generalized  weakness and ambulatory dysfunction----PT and OT eval appreciated recommends SNF rehab  4)DM2- Use Novolog/Humalog Sliding scale insulin with Accu-Cheks/Fingersticks as ordered   5)CAD--- patient without symptoms at this time, continue isosorbide, carvedilol, Plavix,  and aspirin Hold statin while on Paxlovid  6)Depression and PTSD Continue  Lamictal.   7)Left avulsion fracture from the tip of the medial malleolus Patient noted in walker boot -PT OT eval appreciated recommends SNF rehab   -Total care time is 43 minutes  Patient seen and evaluated, chart reviewed, please see EMR for updated orders. Please see full H&P dictated by admitting physician Dr. Josephine Cables for same date of service.   Roxan Hockey, MD

## 2021-12-22 NOTE — ED Notes (Signed)
Pt given ice water per request.

## 2021-12-22 NOTE — H&P (Addendum)
History and Physical  Erika [name] BTD:[id number] DOB: 21-Sep-1971 DOA: 12/21/2021  Referring physician: Maudie Flakes, MD PCP: Lesleigh Noe, MD  Patient coming from: [facility]  Chief Complaint: Altered mental status  HPI: Erika [name] is a 50 y.o. female with medical history significant for prior stroke with residual left-sided hemiparesis of her arm and partial paresis of her leg,  coronary artery disease s/p bypass grafting, carotid artery disease s/p CEA, diastolic dysfunction, depression, PAD and PTSD who presents to the emergency department via EMS due to reported altered mental status.  Patient's mental status appeared to be at baseline on arrival to the ED, patient endorsed increased urinary frequency within last couple of days, but she denies burning sensation on urination or any other irritative bladder symptoms.  Patient endorsed subjective fever, but denies chills, cough, chest congestion or nasal congestion.  ED Course:  In the emergency department, she was noted to be febrile with a temperature of 102.42F, she was intermittently tachypneic and initially tachycardic.  BP was soft at 94/56.  O2 sat was 89% on room, supplemental oxygen via  at 2 LPM was provided with improvement in O2 sat to 92-94%.  Work-up in the ED showed normal CBC except leukocytosis, BMP showed mild hyponatremia and hyperglycemia.  Urinalysis was normal, lactic acid was negative.  Influenza A, B was negative.  SARS coronavirus 2 was positive. Chest x-ray showed no active disease Patient was treated with Decadron, empiric IV ceftriaxone and azithromycin was given due to presumed pneumonia.  IV hydration was provided.  Hospitalist was asked to admit patient for further evaluation and management.  Review of Systems: A full 10 point Review of Systems was done, except as stated above, all other Review of systems were negative.   Past Medical History:  Diagnosis Date   Arthralgia of  temporomandibular joint    CAD, multiple vessel    a. 06/2016 Cath: ostLM 40%, ostLAD 40%, pLAD 95%, ost-pLCx 60%, pLCx 95%, mLCx 60%, mRCA 95%, D2 50%, LVSF nl;  b. 07/2016 CABG x 4 (LIMA->LAD, VG->Diag, VG->OM, VG->RCA); c. 08/2016 Cath: 3VD w/ 4/4 patent grafts. LAD distal to LIMA has diff dzs->Med rx; d. 08/2020 Cath: 4/4 patent grafts, native 3VD. EF 55-65%-->Med Rx.   Carotid arterial disease ([place])    a. 07/2016 s/p R CEA; b. 02/2021 U/S: RICA 10-62%, LICA 6-94%.   Clotting disorder ([place])    Depression    Diastolic dysfunction    a. 06/2016 Echo: EF 50-55%, mild inf wall HK, GR1DD, mild MR, RV sys fxn nl, mildly dilated LA, PASP nl   Fatty liver disease, nonalcoholic 8546   History of blood transfusion    with heart surgery   HLD (hyperlipidemia)    Labile hypertension    a. prior renal ngiogram negative for RAS in 03/2016; b. catecholamines and metanephrines normal, mildly elevated renin with normal aldosterone and normal ratio in 02/2016   Myocardial infarction [facility]) 2017   Obesity    PAD (peripheral artery disease) ([place])    a. 09/2018 s/p L SFA stenting; b. 07/2019 Periph Angio: Patent m/d L SFA stent w/ 100% L SFA distal to stent. L AT 100d, L Peroneal diff dzs-->Med Rx; c. 02/2021 ABIs: stable @ 0.61 on R and 0.46 on L.   PTSD (post-traumatic stress disorder)    Tobacco abuse    Type 2 diabetes mellitus ([place]) 12/2015   Past Surgical History:  Procedure Laterality Date   ABDOMINAL AORTOGRAM W/LOWER EXTREMITY [name] 10/15/2018  Procedure: ABDOMINAL AORTOGRAM W/LOWER EXTREMITY;  Surgeon: Wellington Hampshire, MD;  Location: [place] [facility];  Service: Cardiovascular;  Laterality: [name];   ABDOMINAL AORTOGRAM W/LOWER EXTREMITY Bilateral 08/19/2019   Procedure: ABDOMINAL AORTOGRAM W/LOWER EXTREMITY;  Surgeon: Wellington Hampshire, MD;  Location: [place] [facility];  Service: Cardiovascular;  Laterality: Bilateral;   CARDIAC CATHETERIZATION [name] 06/29/2016   Procedure: Left Heart Cath and Coronary  Angiography;  Surgeon: Minna Merritts, MD;  Location: [place] [facility];  Service: Cardiovascular;  Laterality: [name];   CARDIAC CATHETERIZATION [name] 08/29/2016   Procedure: Left Heart Cath and Cors/Grafts Angiography;  Surgeon: Wellington Hampshire, MD;  Location: [place] [facility];  Service: Cardiovascular;  Laterality: [name];   CESAREAN SECTION     CHOLECYSTECTOMY     CORONARY ARTERY BYPASS GRAFT [name] 07/06/2016   Procedure: CORONARY ARTERY BYPASS GRAFTING (CABG) x four, using left internal mammary artery and right leg greater saphenous vein harvested endoscopically;  Surgeon: Ivin Poot, MD;  Location: [place];  Service: Open Heart Surgery;  Laterality: [name];   ENDARTERECTOMY Right 07/06/2016   Procedure: ENDARTERECTOMY CAROTID;  Surgeon: Rosetta Posner, MD;  Location: [facility] OR;  Service: Vascular;  Laterality: Right;   ENDARTERECTOMY Right 04/27/2020   Procedure: REDO OF RIGHT ENDARTERECTOMY CAROTID;  Surgeon: Rosetta Posner, MD;  Location: [facility] OR;  Service: Vascular;  Laterality: Right;   LEFT HEART CATH AND CORS/GRAFTS ANGIOGRAPHY [name] 08/24/2020   Procedure: LEFT HEART CATH AND CORS/GRAFTS ANGIOGRAPHY;  Surgeon: Wellington Hampshire, MD;  Location: [place] [facility];  Service: Cardiovascular;  Laterality: [name];   PERIPHERAL VASCULAR CATHETERIZATION [name] 04/18/2016   Procedure: Renal Angiography;  Surgeon: Wellington Hampshire, MD;  Location: [facility] [place] [facility];  Service: Cardiovascular;  Laterality: [name];   PERIPHERAL VASCULAR INTERVENTION Left 10/15/2018   Procedure: PERIPHERAL VASCULAR INTERVENTION;  Surgeon: Wellington Hampshire, MD;  Location: [place] [facility];  Service: Cardiovascular;  Laterality: Left;  Left superficial femoral   TEE WITHOUT CARDIOVERSION [name] 07/06/2016   Procedure: TRANSESOPHAGEAL ECHOCARDIOGRAM (TEE);  Surgeon: Ivin Poot, MD;  Location: [place];  Service: Open Heart Surgery;  Laterality: [name];   TONSILLECTOMY      Social History:  reports that she has quit smoking. Her smoking use  included cigarettes. She has a 13.50 pack-year smoking history. She has never used smokeless tobacco. She reports that she does not currently use alcohol. She reports that she does not use drugs.   Allergies  Allergen Reactions   Chantix [Varenicline Tartrate] Other (See Comments)    Feels "crazy" and angry     Family History  Adopted: Yes  Problem Relation Age of Onset   Diabetes Mother    Diabetes Father    Alcohol abuse Father    Heart disease Father    Drug abuse Father    Stroke Sister    Anxiety disorder Sister      Prior to Admission medications   Medication Sig Start Date End Date Taking? Authorizing Provider  acetaminophen (TYLENOL) 325 MG tablet Take 1-2 tablets (325-650 mg total) by mouth every 4 (four) hours as needed for mild pain. 08/01/21   Love, Ivan Anchors, PA-C  albuterol (PROVENTIL HFA;VENTOLIN HFA) 108 (90 Base) MCG/ACT inhaler Inhale 2 puffs into the lungs every 6 (six) hours as needed for wheezing. 03/02/19 11/21/22  Lucille Passy, MD  aspirin 81 MG chewable tablet Chew 1 tablet (81 mg total) by mouth daily. 06/29/21   Enzo Bi, MD  baclofen (LIORESAL) 10 MG tablet Take 1 tablet (10 mg total) by mouth at bedtime. 12/08/21   Angiulli, Lavon Paganini, PA-C  benztropine (COGENTIN) 0.5 MG tablet Take 1 tablet (0.5 mg total) by mouth at bedtime. 12/08/21 12/08/22  Angiulli, Lavon Paganini, PA-C  carvedilol (COREG) 6.25 MG tablet Take 1 tablet (6.25 mg total) by mouth 2 (two) times daily. 08/22/21   Wellington Hampshire, MD  chlorproMAZINE (THORAZINE) 100 MG tablet TAKE 1 TABLET BY MOUTH EVERYDAY AT BEDTIME 12/08/21   Angiulli, Lavon Paganini, PA-C  clonazePAM (KLONOPIN) 0.5 MG tablet Take 1 tablet (0.5 mg total) by mouth 2 (two) times daily. 12/08/21   Angiulli, Lavon Paganini, PA-C  clopidogrel (PLAVIX) 75 MG tablet Take 1 tablet (75 mg total) by mouth daily. 09/04/21   Wellington Hampshire, MD  cyanocobalamin (,VITAMIN B-12,) 1000 MCG/ML injection Inject 1 mL (1,000 mcg total) into the skin every 30  (thirty) days. 08/29/21   Love, Ivan Anchors, PA-C  diclofenac Sodium (VOLTAREN) 1 % GEL Apply 2 g topically 4 (four) times daily. 12/08/21   Angiulli, Lavon Paganini, PA-C  Dulaglutide (TRULICITY) 7.12 WP/8.0DX SOPN Inject 0.75 mg into the skin once a week. 03/21/21   Renato Shin, MD  escitalopram (LEXAPRO) 20 MG tablet Take 1 tablet (20 mg total) by mouth daily. 11/14/21 11/14/22  Arfeen, Arlyce Harman, MD  Evolocumab (REPATHA SURECLICK) 833 MG/ML SOAJ Inject 1 Dose into the skin every 14 (fourteen) days. 09/07/21   Hilty, Nadean Corwin, MD  ezetimibe (ZETIA) 10 MG tablet Take 1 tablet (10 mg total) by mouth daily. 09/04/21   Wellington Hampshire, MD  guaiFENesin (MUCINEX) 600 MG 12 hr tablet Take 1 tablet (600 mg total) by mouth 2 (two) times daily. 08/01/21   Love, Ivan Anchors, PA-C  HYDROcodone-acetaminophen (NORCO/VICODIN) 5-325 MG tablet Take 1-2 tablets by mouth every 6 (six) hours as needed for severe pain or moderate pain. 12/08/21   Angiulli, Lavon Paganini, PA-C  icosapent Ethyl (VASCEPA) 1 g capsule Take 2 capsules (2 g total) by mouth 2 (two) times daily. 09/07/21   Hilty, Nadean Corwin, MD  insulin glargine-yfgn (SEMGLEE) 100 UNIT/ML injection Inject 0.5 mLs (50 Units total) into the skin daily. 12/08/21   Angiulli, Lavon Paganini, PA-C  isosorbide mononitrate (IMDUR) 60 MG 24 hr tablet TAKE ONE TABLET BY MOUTH TWICE A DAY 09/25/21   Wellington Hampshire, MD  lamoTRIgine (LAMICTAL) 200 MG tablet Take 1 tablet (200 mg total) by mouth at bedtime. 12/08/21   Angiulli, Lavon Paganini, PA-C  nitroGLYCERIN (NITROSTAT) 0.4 MG SL tablet Place 1 tablet (0.4 mg total) under the tongue every 5 (five) minutes as needed for chest pain. 03/15/21   Wellington Hampshire, MD  pantoprazole (PROTONIX) 40 MG tablet Take 1 tablet (40 mg total) by mouth daily. 11/28/21   Raulkar, Clide Deutscher, MD  polyethylene glycol powder (GLYCOLAX/MIRALAX) 17 GM/SCOOP powder MIX 1 CAPFUL IN 8 OUNCES OF FLUID BY MOUTH TWICE A DAY 10/17/21   Raulkar, Clide Deutscher, MD  potassium chloride  (KLOR-CON) 20 MEQ packet Take 20 mEq by mouth daily. 12/08/21   Angiulli, Lavon Paganini, PA-C  pregabalin (LYRICA) 225 MG capsule Take 1 capsule (225 mg total) by mouth 2 (two) times daily. 12/08/21   Angiulli, Lavon Paganini, PA-C  rosuvastatin (CRESTOR) 20 MG tablet Take 1 tablet (20 mg total) by mouth daily. 08/01/21   Love, Ivan Anchors, PA-C  senna-docusate (SENOKOT-S) 8.6-50 MG tablet Take 2 tablets by mouth 2 (two) times daily. 08/01/21   Love,  Ivan Anchors, PA-C  spironolactone (ALDACTONE) 25 MG tablet TAKE ONE TABLET BY MOUTH ONE TIME DAILY 08/25/21   Raulkar, Clide Deutscher, MD  torsemide (DEMADEX) 10 MG tablet Take 1 tablet (10 mg total) by mouth daily. PLEASE SCHEDULE OFFICE VISIT FOR FURTHER REFILLS. THANK YOU! 10/09/21 11/08/21  Wellington Hampshire, MD  traZODone (DESYREL) 50 MG tablet Take 0.5 tablets (25 mg total) by mouth at bedtime as needed for sleep. 12/08/21   Angiulli, Lavon Paganini, PA-C  vitamin B-12 1000 MCG tablet Take 1 tablet (1,000 mcg total) by mouth daily. 06/29/21   Enzo Bi, MD    Physical Exam: BP (!) 144/77    Pulse 82    Temp 98.5 F (36.9 C) (Oral)    Resp 20    SpO2 92%   General: 50 y.o. year-old female well developed well nourished in no acute distress.  Alert and oriented x3. HEENT: NCAT, EOMI Neck: Supple, trachea medial Cardiovascular: Regular rate and rhythm with no rubs or gallops.  No thyromegaly or JVD noted.  No lower extremity edema. 2/4 pulses in all 4 extremities. Respiratory: Clear to auscultation with no wheezes or rales. Good inspiratory effort. Abdomen: Soft, nontender nondistended with normal bowel sounds x4 quadrants. Muskuloskeletal: Orthopedic boot noted in left foot.  No cyanosis, clubbing or edema noted bilaterally Neuro: Noted complete paresis of left arm and movable but weak left leg. CN II-XII intact Skin: No ulcerative lesions noted or rashes Psychiatry: Mood is appropriate for condition and setting          Labs on Admission:  Basic Metabolic Panel: Recent  Labs  Lab 12/21/21 2215  NA 134*  K 3.6  CL 96*  CO2 29  GLUCOSE 134*  BUN 15  CREATININE 0.96  CALCIUM 9.7   Liver Function Tests: Recent Labs  Lab 12/21/21 2215  AST 31  ALT 28  ALKPHOS 61  BILITOT 0.1*  PROT 7.9  ALBUMIN 4.2   No results for input(s): LIPASE, AMYLASE in the last 168 hours. No results for input(s): AMMONIA in the last 168 hours. CBC: Recent Labs  Lab 12/21/21 2215  WBC 13.7*  NEUTROABS 11.9*  HGB 13.9  HCT 41.9  MCV 84.6  PLT 233   Cardiac Enzymes: No results for input(s): CKTOTAL, CKMB, CKMBINDEX, TROPONINI in the last 168 hours.  BNP (last 3 results) Recent Labs    06/24/21 0540  BNP 33.3    ProBNP (last 3 results) No results for input(s): PROBNP in the last 8760 hours.  CBG: No results for input(s): GLUCAP in the last 168 hours.  Radiological Exams on Admission: DG Chest Port 1 View  Result Date: 12/21/2021 CLINICAL DATA:  Sepsis EXAM: PORTABLE CHEST 1 VIEW COMPARISON:  11/21/2021 FINDINGS: The heart size and mediastinal contours are within normal limits. Both lungs are clear. The visualized skeletal structures are unremarkable. IMPRESSION: No active disease. Electronically Signed   By: Ulyses Jarred M.D.   On: 12/21/2021 23:08    EKG: I independently viewed the EKG done and my findings are as followed: Sinus tachycardia at a rate of 110 bpm with QTc 492 ms  Assessment/Plan Present on Admission:  COVID-19 virus infection  Leukocytosis  Principal Problem:   COVID-19 virus infection Active Problems:   Leukocytosis   Acute respiratory failure with hypoxia (HCC)   Hyperglycemia  Acute respiratory failure with hypoxia possibly secondary to COVID-19 virus infection Continue albuterol q.6h Continue Paxlovid Continue vitamin-C 500 mg p.o. Daily Continue zinc 220 mg p.o. Daily Continue  Tylenol p.r.n. for fever Continue supplemental oxygen to maintain O2 sat > or = 94% with plan to wean patient off supplemental oxygen as  tolerated (of note, patient does not use oxygen at baseline) Continue incentive spirometry and flutter valve q24min as tolerated Encourage proning, early ambulation, and side laying as tolerated Continue airborne isolation precaution Continue monitoring daily inflammatory markers  Presumed concomitant CAP POA Patient was empirically started on IV ceftriaxone and azithromycin, we shall continue with same at this time with plan to de-escalate/discontinue based on blood culture, urine Legionella, strep pneumo, sputum culture, procalcitonin Continue Tylenol as needed Continue Mucinex, incentive spirometry, flutter valve   Presumed UTI POA Urinalysis was normal, does she complain of recent increased urinary frequency without any other irritative bladder symptoms. IV ceftriaxone empirically started above for presumed concomitant CAP POA will cover the UTI Urine culture pending  Leukocytosis possible secondary to above 3 Continue treatment as described for above 3  Hyperglycemia secondary to T2DM Hemoglobin A1c 1 month ago (11/29) was 7.0 Continue ISS and hypoglycemia protocol Continue Semglee 10 units nightly (patient uses 50 units daily at baseline) and adjust dose accordingly  Borderline prolonged QTc (492 ms) Avoid QT prolonging drugs Magnesium level will be checked  Left avulsion fracture from the tip of the medial malleolus Patient noted in walker boot Continue PT/OT eval and treat  GERD Continue Protonix  Coronary artery disease. Continue aspirin, Plavix, statin Coreg, Imdur, as needed sublingual nitroglycerin will be held at this time due to soft BP  Dyslipidemia Continue Vascepa, Crestor  Depression and PTSD Continue  Lamictal.   DVT prophylaxis: Lovenox  Code Status: Full code  Family Communication: None at bedside  Disposition Plan:  Patient is from:                        home Anticipated DC to:                   SNF or family members home Anticipated DC  date:               2-3 days Anticipated DC barriers:         Patient requires inpatient management due to severity of symptoms  Consults called: None  Admission status: Observation    Bernadette Hoit MD [facility]  12/22/2021, 3:57 AM

## 2021-12-23 DIAGNOSIS — J1282 Pneumonia due to coronavirus disease 2019: Secondary | ICD-10-CM | POA: Diagnosis present

## 2021-12-23 DIAGNOSIS — K219 Gastro-esophageal reflux disease without esophagitis: Secondary | ICD-10-CM | POA: Diagnosis present

## 2021-12-23 DIAGNOSIS — I69354 Hemiplegia and hemiparesis following cerebral infarction affecting left non-dominant side: Secondary | ICD-10-CM | POA: Diagnosis not present

## 2021-12-23 DIAGNOSIS — Z8249 Family history of ischemic heart disease and other diseases of the circulatory system: Secondary | ICD-10-CM | POA: Diagnosis not present

## 2021-12-23 DIAGNOSIS — I2510 Atherosclerotic heart disease of native coronary artery without angina pectoris: Secondary | ICD-10-CM | POA: Diagnosis present

## 2021-12-23 DIAGNOSIS — Z833 Family history of diabetes mellitus: Secondary | ICD-10-CM | POA: Diagnosis not present

## 2021-12-23 DIAGNOSIS — A419 Sepsis, unspecified organism: Secondary | ICD-10-CM | POA: Diagnosis present

## 2021-12-23 DIAGNOSIS — Z951 Presence of aortocoronary bypass graft: Secondary | ICD-10-CM | POA: Diagnosis not present

## 2021-12-23 DIAGNOSIS — Z823 Family history of stroke: Secondary | ICD-10-CM | POA: Diagnosis not present

## 2021-12-23 DIAGNOSIS — S8252XD Displaced fracture of medial malleolus of left tibia, subsequent encounter for closed fracture with routine healing: Secondary | ICD-10-CM | POA: Diagnosis not present

## 2021-12-23 DIAGNOSIS — I252 Old myocardial infarction: Secondary | ICD-10-CM | POA: Diagnosis not present

## 2021-12-23 DIAGNOSIS — X58XXXD Exposure to other specified factors, subsequent encounter: Secondary | ICD-10-CM | POA: Diagnosis present

## 2021-12-23 DIAGNOSIS — F32A Depression, unspecified: Secondary | ICD-10-CM | POA: Diagnosis present

## 2021-12-23 DIAGNOSIS — E1165 Type 2 diabetes mellitus with hyperglycemia: Secondary | ICD-10-CM | POA: Diagnosis present

## 2021-12-23 DIAGNOSIS — J9601 Acute respiratory failure with hypoxia: Secondary | ICD-10-CM | POA: Diagnosis present

## 2021-12-23 DIAGNOSIS — R6510 Systemic inflammatory response syndrome (SIRS) of non-infectious origin without acute organ dysfunction: Secondary | ICD-10-CM | POA: Diagnosis present

## 2021-12-23 DIAGNOSIS — Z87891 Personal history of nicotine dependence: Secondary | ICD-10-CM | POA: Diagnosis not present

## 2021-12-23 DIAGNOSIS — F4310 Post-traumatic stress disorder, unspecified: Secondary | ICD-10-CM | POA: Diagnosis present

## 2021-12-23 DIAGNOSIS — U071 COVID-19: Secondary | ICD-10-CM | POA: Diagnosis not present

## 2021-12-23 DIAGNOSIS — E871 Hypo-osmolality and hyponatremia: Secondary | ICD-10-CM | POA: Diagnosis present

## 2021-12-23 LAB — CBC WITH DIFFERENTIAL/PLATELET
Abs Immature Granulocytes: 0.16 10*3/uL — ABNORMAL HIGH (ref 0.00–0.07)
Basophils Absolute: 0 10*3/uL (ref 0.0–0.1)
Basophils Relative: 0 %
Eosinophils Absolute: 0 10*3/uL (ref 0.0–0.5)
Eosinophils Relative: 0 %
HCT: 39.8 % (ref 36.0–46.0)
Hemoglobin: 12.7 g/dL (ref 12.0–15.0)
Immature Granulocytes: 1 %
Lymphocytes Relative: 14 %
Lymphs Abs: 2.4 10*3/uL (ref 0.7–4.0)
MCH: 27.4 pg (ref 26.0–34.0)
MCHC: 31.9 g/dL (ref 30.0–36.0)
MCV: 85.8 fL (ref 80.0–100.0)
Monocytes Absolute: 0.7 10*3/uL (ref 0.1–1.0)
Monocytes Relative: 4 %
Neutro Abs: 14.1 10*3/uL — ABNORMAL HIGH (ref 1.7–7.7)
Neutrophils Relative %: 81 %
Platelets: 263 10*3/uL (ref 150–400)
RBC: 4.64 MIL/uL (ref 3.87–5.11)
RDW: 14.8 % (ref 11.5–15.5)
WBC: 17.4 10*3/uL — ABNORMAL HIGH (ref 4.0–10.5)
nRBC: 0 % (ref 0.0–0.2)

## 2021-12-23 LAB — URINE CULTURE: Culture: NO GROWTH

## 2021-12-23 LAB — MAGNESIUM: Magnesium: 1.8 mg/dL (ref 1.7–2.4)

## 2021-12-23 LAB — C-REACTIVE PROTEIN: CRP: 13.6 mg/dL — ABNORMAL HIGH (ref <1.0)

## 2021-12-23 LAB — FERRITIN: Ferritin: 108 ng/mL (ref 11–307)

## 2021-12-23 LAB — GLUCOSE, CAPILLARY
Glucose-Capillary: 161 mg/dL — ABNORMAL HIGH (ref 70–99)
Glucose-Capillary: 233 mg/dL — ABNORMAL HIGH (ref 70–99)
Glucose-Capillary: 277 mg/dL — ABNORMAL HIGH (ref 70–99)
Glucose-Capillary: 298 mg/dL — ABNORMAL HIGH (ref 70–99)

## 2021-12-23 LAB — D-DIMER, QUANTITATIVE: D-Dimer, Quant: 0.74 ug/mL-FEU — ABNORMAL HIGH (ref 0.00–0.50)

## 2021-12-23 LAB — PHOSPHORUS: Phosphorus: 2.1 mg/dL — ABNORMAL LOW (ref 2.5–4.6)

## 2021-12-23 MED ORDER — HYDROCODONE-ACETAMINOPHEN 5-325 MG PO TABS
1.0000 | ORAL_TABLET | Freq: Once | ORAL | Status: AC
  Administered 2021-12-23: 1 via ORAL
  Filled 2021-12-23: qty 1

## 2021-12-23 MED ORDER — K PHOS MONO-SOD PHOS DI & MONO 155-852-130 MG PO TABS
250.0000 mg | ORAL_TABLET | Freq: Three times a day (TID) | ORAL | Status: DC
  Administered 2021-12-23 – 2021-12-24 (×4): 250 mg via ORAL
  Filled 2021-12-23 (×4): qty 1

## 2021-12-23 MED ORDER — SODIUM CHLORIDE 0.9 % IV SOLN: INTRAVENOUS | Status: DC | PRN

## 2021-12-23 MED ORDER — HYDROCODONE-ACETAMINOPHEN 5-325 MG PO TABS
1.0000 | ORAL_TABLET | Freq: Once | ORAL | Status: AC | PRN
  Administered 2021-12-23: 1 via ORAL
  Filled 2021-12-23: qty 1

## 2021-12-23 NOTE — Progress Notes (Signed)
PROGRESS NOTE     Erika [name], is a 50 y.o. female, DOB - 05/13/1971, FUX:[id number]  Admit date - 12/21/2021   Admitting Physician Erika Miano Denton Brick, MD  Outpatient Primary MD for the patient is Erika Noe, MD  LOS - 0  Chief Complaint  Patient presents with   Altered Mental Status       Brief Summary:- 50 y.o. female with medical history significant for prior stroke with residual left-sided hemiparesis of her arm and partial paresis of her leg,  coronary artery disease s/p bypass grafting, carotid artery disease s/p CEA, diastolic dysfunction, depression, PAD and PTSD admitted with acute hypoxic respiratory failure in setting of COVID-19 infection and discharged back to facility  Assessment & Plan:   Principal Problem:   COVID-19 virus infection Active Problems:   Leukocytosis   Acute respiratory failure with hypoxia (HCC)   Hyperglycemia   SIRS (systemic inflammatory response syndrome) (HCC)  A/p 1)SIRS--- pathophysiology--chest x-ray and UA not suggestive of infection PCT 1.89 WBC 13.7>>17.3>>17.4 Lactic acid 1.6 >> 1.0 -Continue Rocephin and azithromycin -Patient does Not meet sepsis criteria -Cultures pending  2)Covid 19 +ve, Flu neg--- chest x-ray without acute findings -Rx with Paxlovid, vitamin C zinc and symptomatic and supportive treatment  3)Generalized  weakness and ambulatory dysfunction----PT and OT eval appreciated recommends SNF rehab   4)DM2- Use Novolog/Humalog Sliding scale insulin with Accu-Cheks/Fingersticks as ordered    5)CAD/PAD--- status post prior MI, status post prior right carotid endaterectomy -- patient without symptoms at this time,  --continue isosorbide, carvedilol, Plavix,  and aspirin Hold statin while on Paxlovid   6)Depression and PTSD Continue  Lamictal.    7)Left avulsion fracture from the tip of the medial malleolus Patient noted in walker boot -PT OT eval appreciated recommends SNF rehab  Disposition/Need  for in-Hospital Stay- patient unable to be discharged at this time due to --SIRS pathophysiology with persistent leukocytosis, continue IV antibiotics pending culture data -Consider discharge back to [facility] SNF if cultures remain negative in 1-2 days  Status is: Inpatient  Remains inpatient appropriate because:   Disposition: The patient is from: SNF              Anticipated d/c is to: SNF              Anticipated d/c date is: 1 day              Patient currently is not medically stable to d/c. Barriers: Not Clinically Stable-   Code Status : -  Code Status: Full Code   Family Communication:   A (patient is alert, awake and coherent)   Consults  :    DVT Prophylaxis  :   - SCDs enoxaparin (LOVENOX) injection 40 mg Start: 12/22/21 0600 SCDs Start: 12/22/21 0411    Lab Results  Component Value Date   PLT 263 12/23/2021    Inpatient Medications  Scheduled Meds:  vitamin C  500 mg Oral Daily   aspirin  81 mg Oral Daily   clopidogrel  75 mg Oral Daily   dexamethasone  6 mg Oral Q24H   dextromethorphan-guaiFENesin  1 tablet Oral BID   enoxaparin (LOVENOX) injection  40 mg Subcutaneous Q24H   insulin aspart  0-15 Units Subcutaneous TID WC   insulin aspart  0-5 Units Subcutaneous QHS   insulin glargine-yfgn  10 Units Subcutaneous QHS   lamoTRIgine  200 mg Oral QHS   nirmatrelvir/ritonavir EUA  3 tablet Oral BID   pantoprazole  40 mg  Oral Daily   phosphorus  250 mg Oral TID   pregabalin  225 mg Oral BID   [START ON 12/27/2021] rosuvastatin  20 mg Oral Daily   zinc sulfate  220 mg Oral Daily   Continuous Infusions:  azithromycin 500 mg (12/22/21 2310)   cefTRIAXone (ROCEPHIN)  IV 2 g (12/22/21 2221)   PRN Meds:.acetaminophen, albuterol   Anti-infectives (From admission, onward)    Start     Dose/Rate Route Frequency Ordered Stop   12/22/21 1000  nirmatrelvir/ritonavir EUA (PAXLOVID) 3 tablet        3 tablet Oral 2 times daily 12/22/21 0401 12/27/21 0959   12/21/21  2215  cefTRIAXone (ROCEPHIN) 2 g in sodium chloride 0.9 % 100 mL IVPB        2 g 200 mL/hr over 30 Minutes Intravenous Every 24 hours 12/21/21 2200 12/26/21 2214   12/21/21 2215  azithromycin (ZITHROMAX) 500 mg in sodium chloride 0.9 % 250 mL IVPB        500 mg 250 mL/hr over 60 Minutes Intravenous Every 24 hours 12/21/21 2200 12/26/21 2214         Subjective: Erika [name] today has no fevers, no emesis,  No chest pain,  -cough and shortness of breath improving -Voiding well,   Objective: Vitals:   12/22/21 1800 12/22/21 2144 12/23/21 0515 12/23/21 1254  BP:  (!) 184/68 (!) 151/109 (!) 140/92  Pulse:  70 (!) 101 89  Resp:  20 17 18   Temp:  98.2 F (36.8 C) 98.1 F (36.7 C) 98.4 F (36.9 C)  TempSrc:  Oral Oral Oral  SpO2:  96% 90% 93%  Weight: 94.8 kg     Height:        Intake/Output Summary (Last 24 hours) at 12/23/2021 1744 Last data filed at 12/23/2021 1308 Gross per 24 hour  Intake 816 ml  Output 200 ml  Net 616 ml   Filed Weights   12/22/21 1700 12/22/21 1800  Weight: 93.4 kg 94.8 kg    Physical Exam  Gen:- Awake Alert, in no acute distress HEENT:- [place].AT, No sclera icterus Neck-Supple Neck,No JVD,.  Scar from prior right carotid endarterectomy Lungs-no significant wheezing, fair symmetrical air movement CV- S1, S2 normal, regular , prior sternotomy scar Abd-  +ve B.Sounds, Abd Soft, No tenderness,    Extremity/Skin:-   pedal pulses present , Lt ankle without significant erythema or new findings today Psych-affect is appropriate, oriented x3 Neuro-residual left-sided hemiparesis , no new focal deficits, no tremors  Data Reviewed: I have personally reviewed following labs and imaging studies  CBC: Recent Labs  Lab 12/21/21 2215 12/22/21 0505 12/23/21 0552  WBC 13.7* 17.3* 17.4*  NEUTROABS 11.9* 15.5* 14.1*  HGB 13.9 11.7* 12.7  HCT 41.9 36.0 39.8  MCV 84.6 87.4 85.8  PLT 233 208 160   Basic Metabolic Panel: Recent Labs  Lab  12/21/21 2215 12/22/21 0505 12/23/21 0552  NA 134*  --   --   K 3.6  --   --   CL 96*  --   --   CO2 29  --   --   GLUCOSE 134*  --   --   BUN 15  --   --   CREATININE 0.96  --   --   CALCIUM 9.7  --   --   MG  --  1.7 1.8  PHOS  --  2.5 2.1*   GFR: Estimated Creatinine Clearance: 84.7 mL/min (by C-G formula based on SCr of  0.96 mg/dL). Liver Function Tests: Recent Labs  Lab 12/21/21 2215  AST 31  ALT 28  ALKPHOS 61  BILITOT 0.1*  PROT 7.9  ALBUMIN 4.2   No results for input(s): LIPASE, AMYLASE in the last 168 hours. No results for input(s): AMMONIA in the last 168 hours. Coagulation Profile: Recent Labs  Lab 12/21/21 2221  INR 1.1   Cardiac Enzymes: No results for input(s): CKTOTAL, CKMB, CKMBINDEX, TROPONINI in the last 168 hours. BNP (last 3 results) No results for input(s): PROBNP in the last 8760 hours. HbA1C: No results for input(s): HGBA1C in the last 72 hours. CBG: Recent Labs  Lab 12/22/21 1725 12/22/21 2146 12/23/21 0743 12/23/21 1112 12/23/21 1642  GLUCAP 172* 142* 161* 233* 277*   Lipid Profile: No results for input(s): CHOL, HDL, LDLCALC, TRIG, CHOLHDL, LDLDIRECT in the last 72 hours. Thyroid Function Tests: No results for input(s): TSH, T4TOTAL, FREET4, T3FREE, THYROIDAB in the last 72 hours. Anemia Panel: Recent Labs    12/22/21 0505 12/23/21 0552  FERRITIN 86 108   Urine analysis:    Component Value Date/Time   COLORURINE YELLOW 12/22/2021 0027   APPEARANCEUR CLEAR 12/22/2021 0027   APPEARANCEUR Clear 09/11/2021 1413   LABSPEC 1.016 12/22/2021 0027   LABSPEC 1.024 01/11/2015 1022   PHURINE 5.0 12/22/2021 0027   GLUCOSEU NEGATIVE 12/22/2021 0027   GLUCOSEU >=1000 (A) 10/19/2019 1034   HGBUR NEGATIVE 12/22/2021 0027   BILIRUBINUR NEGATIVE 12/22/2021 0027   BILIRUBINUR Negative 09/11/2021 1413   BILIRUBINUR Negative 01/11/2015 1022   KETONESUR NEGATIVE 12/22/2021 0027   PROTEINUR NEGATIVE 12/22/2021 0027   UROBILINOGEN 1.0  10/19/2019 1049   UROBILINOGEN 1.0 10/19/2019 1034   NITRITE NEGATIVE 12/22/2021 0027   LEUKOCYTESUR NEGATIVE 12/22/2021 0027   LEUKOCYTESUR Negative 01/11/2015 1022   Sepsis Labs: @LABRCNTIP (procalcitonin:4,lacticidven:4)  ) Recent Results (from the past 240 hour(s))  Resp Panel by RT-PCR (Flu A&B, Covid) Nasopharyngeal Swab     Status: Abnormal   Collection Time: 12/21/21 10:13 PM   Specimen: Nasopharyngeal Swab; Nasopharyngeal(NP) swabs in vial transport medium  Result Value Ref Range Status   SARS Coronavirus 2 by RT PCR POSITIVE (A) NEGATIVE Final    Comment: (NOTE) SARS-CoV-2 target nucleic acids are DETECTED.  The SARS-CoV-2 RNA is generally detectable in upper respiratory specimens during the acute phase of infection. Positive results are indicative of the presence of the identified virus, but do not rule out bacterial infection or co-infection with other pathogens not detected by the test. Clinical correlation with patient history and other diagnostic information is necessary to determine patient infection status. The expected result is Negative.  Fact Sheet for Patients: EntrepreneurPulse.com.au  Fact Sheet for Healthcare Providers: IncredibleEmployment.be  This test is not yet approved or cleared by the [place] FDA and  has been authorized for detection and/or diagnosis of SARS-CoV-2 by FDA under an Emergency Use Authorization (EUA).  This EUA will remain in effect (meaning this test can be used) for the duration of  the COVID-19 declaration under Section 564(b)(1) of the A ct, 21 U.S.C. section 360bbb-3(b)(1), unless the authorization is terminated or revoked sooner.     Influenza A by PCR NEGATIVE NEGATIVE Final   Influenza B by PCR NEGATIVE NEGATIVE Final    Comment: (NOTE) The Xpert Xpress SARS-CoV-2/FLU/RSV plus assay is intended as an aid in the diagnosis of influenza from Nasopharyngeal swab specimens and should  not be used as a sole basis for treatment. Nasal washings and aspirates are unacceptable for Xpert Xpress SARS-CoV-2/FLU/RSV  testing.  Fact Sheet for Patients: EntrepreneurPulse.com.au  Fact Sheet for Healthcare Providers: IncredibleEmployment.be  This test is not yet approved or cleared by the [place] FDA and has been authorized for detection and/or diagnosis of SARS-CoV-2 by FDA under an Emergency Use Authorization (EUA). This EUA will remain in effect (meaning this test can be used) for the duration of the COVID-19 declaration under Section 564(b)(1) of the Act, 21 U.S.C. section 360bbb-3(b)(1), unless the authorization is terminated or revoked.  Performed at [facility], [address] [place], [place] [address]   Blood Culture (routine x 2)     Status: None (Preliminary result)   Collection Time: 12/21/21 10:15 PM   Specimen: BLOOD  Result Value Ref Range Status   Specimen Description BLOOD RIGHT ANTECUBITAL  Final   Special Requests   Final    BOTTLES DRAWN AEROBIC AND ANAEROBIC Blood Culture adequate volume   Culture   Final    NO GROWTH 2 DAYS Performed at [facility], [address], [place], [place] [address]    Report Status PENDING  Incomplete  Blood Culture (routine x 2)     Status: None (Preliminary result)   Collection Time: 12/21/21 10:15 PM   Specimen: BLOOD  Result Value Ref Range Status   Specimen Description BLOOD BLOOD RIGHT WRIST  Final   Special Requests   Final    BOTTLES DRAWN AEROBIC AND ANAEROBIC Blood Culture adequate volume   Culture   Final    NO GROWTH 2 DAYS Performed at [facility], [address] [place], [place] [address]    Report Status PENDING  Incomplete  Urine Culture     Status: None   Collection Time: 12/22/21 12:27 AM   Specimen: Urine, Clean Catch  Result Value Ref Range Status   Specimen Description   Final    URINE, CLEAN CATCH Performed at [facility], [address],  [place], [place] [address]    Special Requests   Final    NONE Performed at [facility], [address], [place], [place] [address]    Culture   Final    NO GROWTH Performed at [facility], [place] [address], [place], [place] [address]    Report Status 12/23/2021 FINAL  Final      Radiology Studies: DG Chest Port 1 View  Result Date: 12/21/2021 CLINICAL DATA:  Sepsis EXAM: PORTABLE CHEST 1 VIEW COMPARISON:  11/21/2021 FINDINGS: The heart size and mediastinal contours are within normal limits. Both lungs are clear. The visualized skeletal structures are unremarkable. IMPRESSION: No active disease. Electronically Signed   By: Ulyses Jarred M.D.   On: 12/21/2021 23:08     Scheduled Meds:  vitamin C  500 mg Oral Daily   aspirin  81 mg Oral Daily   clopidogrel  75 mg Oral Daily   dexamethasone  6 mg Oral Q24H   dextromethorphan-guaiFENesin  1 tablet Oral BID   enoxaparin (LOVENOX) injection  40 mg Subcutaneous Q24H   insulin aspart  0-15 Units Subcutaneous TID WC   insulin aspart  0-5 Units Subcutaneous QHS   insulin glargine-yfgn  10 Units Subcutaneous QHS   lamoTRIgine  200 mg Oral QHS   nirmatrelvir/ritonavir EUA  3 tablet Oral BID   pantoprazole  40 mg Oral Daily   phosphorus  250 mg Oral TID   pregabalin  225 mg Oral BID   [START ON 12/27/2021] rosuvastatin  20 mg Oral Daily   zinc sulfate  220 mg Oral Daily  Continuous Infusions:  azithromycin 500 mg (12/22/21 2310)   cefTRIAXone (ROCEPHIN)  IV 2 g (12/22/21 2221)     LOS: 0 days   Roxan Hockey M.D on 12/23/2021 at 5:44 PM  Go to [email or web address] - for contact info  [facility] - Office  [phone]  If 7PM-7AM, please contact night-coverage [email or web address] Password [facility] 12/23/2021, 5:44 PM

## 2021-12-23 NOTE — Progress Notes (Signed)
Patient Complained of Headache, MD made aware and orders for one time pain medication. Patient also stated she was having difficulty breathing, called respiratory to administer Albuterol. Patient is currently sleeping.

## 2021-12-23 NOTE — Progress Notes (Signed)
Able to ambulate with walker , essentially alone, but requests standby to hold weight on left side of walker while she holds right side but ambulated from bathroom to bed without this assistance.

## 2021-12-24 LAB — CBC WITH DIFFERENTIAL/PLATELET
Abs Immature Granulocytes: 0.09 K/uL — ABNORMAL HIGH (ref 0.00–0.07)
Basophils Absolute: 0 K/uL (ref 0.0–0.1)
Basophils Relative: 0 %
Eosinophils Absolute: 0 K/uL (ref 0.0–0.5)
Eosinophils Relative: 0 %
HCT: 42.7 % (ref 36.0–46.0)
Hemoglobin: 13.5 g/dL (ref 12.0–15.0)
Immature Granulocytes: 1 %
Lymphocytes Relative: 12 %
Lymphs Abs: 1.3 K/uL (ref 0.7–4.0)
MCH: 26.9 pg (ref 26.0–34.0)
MCHC: 31.6 g/dL (ref 30.0–36.0)
MCV: 85.2 fL (ref 80.0–100.0)
Monocytes Absolute: 0.5 K/uL (ref 0.1–1.0)
Monocytes Relative: 4 %
Neutro Abs: 8.9 K/uL — ABNORMAL HIGH (ref 1.7–7.7)
Neutrophils Relative %: 83 %
Platelets: 289 K/uL (ref 150–400)
RBC: 5.01 MIL/uL (ref 3.87–5.11)
RDW: 14.8 % (ref 11.5–15.5)
WBC: 10.8 K/uL — ABNORMAL HIGH (ref 4.0–10.5)
nRBC: 0 % (ref 0.0–0.2)

## 2021-12-24 LAB — C-REACTIVE PROTEIN: CRP: 5.8 mg/dL — ABNORMAL HIGH (ref <1.0)

## 2021-12-24 LAB — GLUCOSE, CAPILLARY
Glucose-Capillary: 196 mg/dL — ABNORMAL HIGH (ref 70–99)
Glucose-Capillary: 315 mg/dL — ABNORMAL HIGH (ref 70–99)

## 2021-12-24 LAB — MAGNESIUM: Magnesium: 2.1 mg/dL (ref 1.7–2.4)

## 2021-12-24 LAB — D-DIMER, QUANTITATIVE: D-Dimer, Quant: 0.49 ug/mL-FEU (ref 0.00–0.50)

## 2021-12-24 LAB — FERRITIN: Ferritin: 97 ng/mL (ref 11–307)

## 2021-12-24 LAB — PHOSPHORUS: Phosphorus: 3.8 mg/dL (ref 2.5–4.6)

## 2021-12-24 MED ORDER — ALBUTEROL SULFATE HFA 108 (90 BASE) MCG/ACT IN AERS: 2.0000 | INHALATION_SPRAY | Freq: Four times a day (QID) | RESPIRATORY_TRACT | 0 refills | Status: DC | PRN

## 2021-12-24 MED ORDER — LEVEMIR FLEXTOUCH 100 UNIT/ML ~~LOC~~ SOPN: 20.0000 [IU] | PEN_INJECTOR | Freq: Every day | SUBCUTANEOUS | 3 refills | Status: DC

## 2021-12-24 MED ORDER — ASPIRIN 81 MG PO CHEW: 81.0000 mg | CHEWABLE_TABLET | Freq: Every day | ORAL | 1 refills | Status: DC

## 2021-12-24 MED ORDER — CLONAZEPAM 0.5 MG PO TABS: 0.5000 mg | ORAL_TABLET | Freq: Two times a day (BID) | ORAL | 0 refills | Status: DC

## 2021-12-24 MED ORDER — HUMALOG JUNIOR KWIKPEN 100 UNIT/ML ~~LOC~~ SOPN: 0.0000 [IU] | PEN_INJECTOR | Freq: Three times a day (TID) | SUBCUTANEOUS | 3 refills | Status: DC

## 2021-12-24 MED ORDER — SENNOSIDES-DOCUSATE SODIUM 8.6-50 MG PO TABS: 2.0000 | ORAL_TABLET | Freq: Every day | ORAL | 0 refills | Status: DC

## 2021-12-24 MED ORDER — HYDROCODONE-ACETAMINOPHEN 5-325 MG PO TABS: 1.0000 | ORAL_TABLET | Freq: Four times a day (QID) | ORAL | 0 refills | Status: DC | PRN

## 2021-12-24 MED ORDER — GUAIFENESIN ER 600 MG PO TB12: 600.0000 mg | ORAL_TABLET | Freq: Two times a day (BID) | ORAL | 0 refills | Status: DC

## 2021-12-24 MED ORDER — ZINC SULFATE 220 (50 ZN) MG PO CAPS: 220.0000 mg | ORAL_CAPSULE | Freq: Every day | ORAL | 0 refills | Status: DC

## 2021-12-24 MED ORDER — ASCORBIC ACID 500 MG PO TABS: 500.0000 mg | ORAL_TABLET | Freq: Every day | ORAL | 2 refills | Status: DC

## 2021-12-24 MED ORDER — DEXAMETHASONE 6 MG PO TABS: 6.0000 mg | ORAL_TABLET | Freq: Every day | ORAL | 0 refills | Status: DC

## 2021-12-24 NOTE — TOC Transition Note (Signed)
Transition of Care [facility]) - CM/SW Discharge Note   Patient Details  Name: Erika [name] MRN: [id number] Date of Birth: Jan 08, 1971  [facility] CM/SW Contact:  Boneta Lucks, RN Phone Number: 12/24/2021, 12:28 PM   Clinical Narrative:   Patient medically ready to discharge back to [facility]. TOC updated [name], her husband. RN called report. TOC schedule [facility] to pickup patient. RN is aware of ETA.   Final next level of care: Skilled Nursing Facility Barriers to Discharge: Barriers Resolved   Patient Goals and CMS Choice Patient states their goals for this hospitalization and ongoing recovery are:: to return to [facility]    Discharge Placement               Patient to be transferred to facility by: [facility] Name of family member notified: [name] Patient and family notified of of transfer: 12/24/21  Discharge Plan and Services      Readmission Risk Interventions Readmission Risk Prevention Plan 12/24/2021 11/23/2021 04/28/2020  Transportation Screening Complete Complete Complete  PCP or Specialist Appt within 5-7 Days - - Complete  Home Care Screening - - Complete  Medication Review (RN CM) - - Complete  Medication Review Press photographer) Complete Complete -  PCP or Specialist appointment within 3-5 days of discharge Complete Complete -  [place] or Home Care Consult Complete Complete -  SW Recovery Care/Counseling Consult - Complete -  Palliative Care Screening - Not Applicable -  [place] - Complete -  Some recent data might be hidden

## 2021-12-24 NOTE — Discharge Instructions (Signed)
1)Avoid ibuprofen/Advil/Aleve/Motrin/Goody Powders/Naproxen/BC powders/Meloxicam/Diclofenac/Indomethacin and other Nonsteroidal anti-inflammatory medications as these will make you more likely to bleed and can cause stomach ulcers, can also cause Kidney problems.   2)Repeat CBC and BMP Blood test on Tuesday 12/26/2021  3)Please adjust Insulin regimen once patient Completes Decadron/Dexamethasone

## 2021-12-24 NOTE — Progress Notes (Signed)
Report given to [facility] RN, [facility] and Rehab. All questions were answered and no further questions at this time. Patient will be transported to facility via [facility].

## 2021-12-24 NOTE — Progress Notes (Signed)
Informed by [facility] CM/SW that Discharge summary will be faxed to facility. [facility] transport has arrived to to transport patient to facility. Patient is in stable condition and in no acute distress at time of transport.

## 2021-12-24 NOTE — Discharge Summary (Signed)
Erika [name], is a 51 y.o. female  DOB 07-18-1971  MRN [id number].  Admission date:  12/21/2021  Admitting Physician  Roxan Hockey, MD  Discharge Date:  12/24/2021   Primary MD  Lesleigh Noe, MD  Recommendations for primary care physician for things to follow:   1)Avoid ibuprofen/Advil/Aleve/Motrin/Goody Powders/Naproxen/BC powders/Meloxicam/Diclofenac/Indomethacin and other Nonsteroidal anti-inflammatory medications as these will make you more likely to bleed and can cause stomach ulcers, can also cause Kidney problems.   2)Repeat CBC and BMP Blood test on Tuesday 12/26/2021  3)Please adjust Insulin regimen once patient Completes Decadron/Dexamethasone  4)insulin Lispro (Humalog) injection 0-10 Units  0-10 Units Subcutaneous, 3 times daily with meals  CBG < 70: Implement Hypoglycemia Standing Orders and refer to Hypoglycemia Standing Orders sidebar report   CBG 70 - 120: 0 unit CBG 121 - 150: 0 unit  CBG 151 - 200: 1 unit  CBG 201 - 250: 2 units  CBG 251 - 300: 4 units  CBG 301 - 350: 6 units   CBG 351 - 400: 8 units  CBG > 400: 10 units  Admission Diagnosis  Sepsis, due to unspecified organism, unspecified whether acute organ dysfunction present ([place]) [A41.9] COVID-19 virus infection [U07.1] COVID-19 [U07.1] SIRS (systemic inflammatory response syndrome) (HCC) [R65.10]   Discharge Diagnosis  Sepsis, due to unspecified organism, unspecified whether acute organ dysfunction present ([place]) [A41.9] COVID-19 virus infection [U07.1] COVID-19 [U07.1] SIRS (systemic inflammatory response syndrome) (HCC) [R65.10]    Principal Problem:   COVID-19 virus infection Active Problems:   Leukocytosis   Acute respiratory failure with hypoxia (HCC)   Hyperglycemia   SIRS (systemic inflammatory response syndrome) (HCC)      Past Medical History:  Diagnosis Date   Arthralgia of  temporomandibular joint    CAD, multiple vessel    a. 06/2016 Cath: ostLM 40%, ostLAD 40%, pLAD 95%, ost-pLCx 60%, pLCx 95%, mLCx 60%, mRCA 95%, D2 50%, LVSF nl;  b. 07/2016 CABG x 4 (LIMA->LAD, VG->Diag, VG->OM, VG->RCA); c. 08/2016 Cath: 3VD w/ 4/4 patent grafts. LAD distal to LIMA has diff dzs->Med rx; d. 08/2020 Cath: 4/4 patent grafts, native 3VD. EF 55-65%-->Med Rx.   Carotid arterial disease ([place])    a. 07/2016 s/p R CEA; b. 02/2021 U/S: RICA 27-06%, LICA 2-37%.   Clotting disorder ([place])    Depression    Diastolic dysfunction    a. 06/2016 Echo: EF 50-55%, mild inf wall HK, GR1DD, mild MR, RV sys fxn nl, mildly dilated LA, PASP nl   Fatty liver disease, nonalcoholic 6283   History of blood transfusion    with heart surgery   HLD (hyperlipidemia)    Labile hypertension    a. prior renal ngiogram negative for RAS in 03/2016; b. catecholamines and metanephrines normal, mildly elevated renin with normal aldosterone and normal ratio in 02/2016   Myocardial infarction [facility]) 2017   Obesity    PAD (peripheral artery disease) ([place])    a. 09/2018 s/p L SFA stenting; b. 07/2019 Periph Angio: Patent m/d L SFA stent  w/ 100% L SFA distal to stent. L AT 100d, L Peroneal diff dzs-->Med Rx; c. 02/2021 ABIs: stable @ 0.61 on R and 0.46 on L.   PTSD (post-traumatic stress disorder)    Tobacco abuse    Type 2 diabetes mellitus ([place]) 12/2015    Past Surgical History:  Procedure Laterality Date   ABDOMINAL AORTOGRAM W/LOWER EXTREMITY [name] 10/15/2018   Procedure: ABDOMINAL AORTOGRAM W/LOWER EXTREMITY;  Surgeon: Wellington Hampshire, MD;  Location: [place] [facility];  Service: Cardiovascular;  Laterality: [name];   ABDOMINAL AORTOGRAM W/LOWER EXTREMITY Bilateral 08/19/2019   Procedure: ABDOMINAL AORTOGRAM W/LOWER EXTREMITY;  Surgeon: Wellington Hampshire, MD;  Location: [place] [facility];  Service: Cardiovascular;  Laterality: Bilateral;   CARDIAC CATHETERIZATION [name] 06/29/2016   Procedure: Left Heart Cath and Coronary  Angiography;  Surgeon: Minna Merritts, MD;  Location: [place];  Service: Cardiovascular;  Laterality: [name];   CARDIAC CATHETERIZATION [name] 08/29/2016   Procedure: Left Heart Cath and Cors/Grafts Angiography;  Surgeon: Wellington Hampshire, MD;  Location: [facility] [facility];  Service: Cardiovascular;  Laterality: [name];   CESAREAN SECTION     CHOLECYSTECTOMY     CORONARY ARTERY BYPASS GRAFT [name] 07/06/2016   Procedure: CORONARY ARTERY BYPASS GRAFTING (CABG) x four, using left internal mammary artery and right leg greater saphenous vein harvested endoscopically;  Surgeon: Ivin Poot, MD;  Location: [facility];  Service: Open Heart Surgery;  Laterality: [name];   ENDARTERECTOMY Right 07/06/2016   Procedure: ENDARTERECTOMY CAROTID;  Surgeon: Rosetta Posner, MD;  [facility];  Service: Vascular;  Laterality: Right;   ENDARTERECTOMY Right 04/27/2020   Procedure: REDO OF RIGHT ENDARTERECTOMY CAROTID;  Surgeon: Rosetta Posner, MD;  Location: [facility] OR;  Service: Vascular;  Laterality: Right;   LEFT HEART CATH AND CORS/GRAFTS ANGIOGRAPHY [name] 08/24/2020   Procedure: LEFT HEART CATH AND CORS/GRAFTS ANGIOGRAPHY;  Surgeon: Wellington Hampshire, MD;  Location: [place] [facility];  Service: Cardiovascular;  Laterality: [name];   PERIPHERAL VASCULAR CATHETERIZATION [name] 04/18/2016   Procedure: Renal Angiography;  Surgeon: Wellington Hampshire, MD;  Location: [place] [facility];  Service: Cardiovascular;  Laterality: [name];   PERIPHERAL VASCULAR INTERVENTION Left 10/15/2018   Procedure: PERIPHERAL VASCULAR INTERVENTION;  Surgeon: Wellington Hampshire, MD;  Location: [place] [facility];  Service: Cardiovascular;  Laterality: Left;  Left superficial femoral   TEE WITHOUT CARDIOVERSION [name] 07/06/2016   Procedure: TRANSESOPHAGEAL ECHOCARDIOGRAM (TEE);  Surgeon: Ivin Poot, MD;  Location: [place];  Service: Open Heart Surgery;  Laterality: [name];   TONSILLECTOMY       HPI  from the history and physical done on the day of admission:     Chief Complaint: Altered mental status   HPI: Erika [name] is a 51 y.o. female with medical history significant for prior stroke with residual left-sided hemiparesis of her arm and partial paresis of her leg,  coronary artery disease s/p bypass grafting, carotid artery disease s/p CEA, diastolic dysfunction, depression, PAD and PTSD who presents to the emergency department via EMS due to reported altered mental status.  Patient's mental status appeared to be at baseline on arrival to the ED, patient endorsed increased urinary frequency within last couple of days, but she denies burning sensation on urination or any other irritative bladder symptoms.  Patient endorsed subjective fever, but denies chills, cough, chest congestion or nasal congestion.   ED Course:  In the emergency department, she was noted to be febrile with a  temperature of 102.58F, she was intermittently tachypneic and initially tachycardic.  BP was soft at 94/56.  O2 sat was 89% on room, supplemental oxygen via [place] at 2 LPM was provided with improvement in O2 sat to 92-94%.  Work-up in the ED showed normal CBC except leukocytosis, BMP showed mild hyponatremia and hyperglycemia.  Urinalysis was normal, lactic acid was negative.  Influenza A, B was negative.  SARS coronavirus 2 was positive. Chest x-ray showed no active disease Patient was treated with Decadron, empiric IV ceftriaxone and azithromycin was given due to presumed pneumonia.  IV hydration was provided.  Hospitalist was asked to admit patient for further evaluation and management.   Review of Systems: A full 10 point Review of Systems was done, except as stated above, all other Review of systems were negative.     Hospital Course:     Brief Summary:- 51 y.o. female with medical history significant for prior stroke with residual left-sided hemiparesis of her arm and partial paresis of her leg,  coronary artery disease s/p bypass grafting, carotid artery disease s/p  CEA, diastolic dysfunction, depression, PAD and PTSD admitted with acute hypoxic respiratory failure in setting of COVID-19 infection and discharged back to facility  A/p 1)SIRS--- pathophysiology--chest x-ray and UA not suggestive of infection PCT 1.89 WBC 13.7>>17.3>>17.4>>10.8 Lactic acid 1.6 >> 1.0 --Treated with Rocephin and azithromycin -Patient does Not meet sepsis criteria -Cultures NGTD, no further abx needed   2)Covid 19 +ve, Flu neg--- chest x-ray without acute findings -Rx with Paxlovid, vitamin C zinc and symptomatic and supportive treatment   3)Generalized  weakness and ambulatory dysfunction----PT and OT eval appreciated recommends SNF rehab   4)DM2- Give Levemir and Use Humalog Sliding scale insulin with Accu-Cheks/Fingersticks as ordered    5)CAD/PAD--- status post prior MI, status post prior right carotid endaterectomy -- patient without symptoms at this time,  --continue isosorbide, carvedilol, Plavix,  and aspirin Held statin while on Paxlovid   6)Depression and PTSD Continue  Lamictal.    7)Left avulsion fracture from the tip of the medial malleolus Patient noted in walker boot -PT OT eval appreciated recommends SNF rehab   Disposition- discharge back to [facility]    Disposition: The patient is from: SNF              Anticipated d/c is to: SNF    Code Status : -  Code Status: Full Code    Family Communication:    (patient is alert, awake and coherent)  I called and spoke with Pt's Husband (he is currently in [place])  Discharge Condition: Stable  Follow UP--pcp   Diet and Activity recommendation:  As advised  Discharge Instructions    Discharge Instructions     Call MD for:  persistant dizziness or light-headedness   Complete by: As directed    Call MD for:  persistant nausea and vomiting   Complete by: As directed    Call MD for:  severe uncontrolled pain   Complete by: As directed    Call MD for:  temperature >100.4   Complete by: As  directed    Diet - low sodium heart healthy   Complete by: As directed    Diet Carb Modified   Complete by: As directed    Discharge instructions   Complete by: As directed    1)Avoid ibuprofen/Advil/Aleve/Motrin/Goody Powders/Naproxen/BC powders/Meloxicam/Diclofenac/Indomethacin and other Nonsteroidal anti-inflammatory medications as these will make you more likely to bleed and can cause stomach ulcers, can also cause Kidney problems.  2)Repeat CBC and BMP Blood test on Tuesday 12/26/2021  3)Please adjust Insulin regimen once patient Completes Decadron/Dexamethasone  4)insulin Lispro (Humalog) injection 0-10 Units  0-10 Units Subcutaneous, 3 times daily with meals  CBG < 70: Implement Hypoglycemia Standing Orders and refer to Hypoglycemia Standing Orders sidebar report   CBG 70 - 120: 0 unit CBG 121 - 150: 0 unit  CBG 151 - 200: 1 unit  CBG 201 - 250: 2 units  CBG 251 - 300: 4 units  CBG 301 - 350: 6 units   CBG 351 - 400: 8 units  CBG > 400: 10 units   Increase activity slowly   Complete by: As directed          Discharge Medications     Allergies as of 12/24/2021       Reactions   Chantix [varenicline Tartrate] Other (See Comments)   Feels "crazy" and angry        Medication List     STOP taking these medications    icosapent Ethyl 1 g capsule Commonly known as: VASCEPA   insulin glargine-yfgn 100 UNIT/ML injection Commonly known as: SEMGLEE Replaced by: Levemir FlexTouch 100 UNIT/ML FlexPen       TAKE these medications    acetaminophen 325 MG tablet Commonly known as: TYLENOL Take 1-2 tablets (325-650 mg total) by mouth every 4 (four) hours as needed for mild pain. What changed: how much to take   albuterol 108 (90 Base) MCG/ACT inhaler Commonly known as: VENTOLIN HFA Inhale 2 puffs into the lungs every 6 (six) hours as needed for wheezing.   ascorbic acid 500 MG tablet Commonly known as: VITAMIN C Take 1 tablet (500 mg total) by mouth  daily. Start taking on: December 25, 2021   aspirin 81 MG chewable tablet Chew 1 tablet (81 mg total) by mouth daily with breakfast. What changed: when to take this   baclofen 10 MG tablet Commonly known as: LIORESAL Take 1 tablet (10 mg total) by mouth at bedtime.   benztropine 0.5 MG tablet Commonly known as: COGENTIN Take 1 tablet (0.5 mg total) by mouth at bedtime.   carvedilol 6.25 MG tablet Commonly known as: COREG Take 1 tablet (6.25 mg total) by mouth 2 (two) times daily.   chlorproMAZINE 100 MG tablet Commonly known as: THORAZINE TAKE 1 TABLET BY MOUTH EVERYDAY AT BEDTIME   clonazePAM 0.5 MG tablet Commonly known as: KlonoPIN Take 1 tablet (0.5 mg total) by mouth 2 (two) times daily.   clopidogrel 75 MG tablet Commonly known as: PLAVIX Take 1 tablet (75 mg total) by mouth daily.   cyanocobalamin 1000 MCG tablet Take 1,000 mcg by mouth daily.   cyanocobalamin 1000 MCG tablet Take 1 tablet (1,000 mcg total) by mouth daily.   cyanocobalamin 1000 MCG/ML injection Commonly known as: (VITAMIN B-12) Inject 1 mL (1,000 mcg total) into the skin every 30 (thirty) days.   dexamethasone 6 MG tablet Commonly known as: DECADRON Take 1 tablet (6 mg total) by mouth daily.   diclofenac Sodium 1 % Gel Commonly known as: VOLTAREN Apply 2 g topically 4 (four) times daily.   escitalopram 20 MG tablet Commonly known as: Lexapro Take 1 tablet (20 mg total) by mouth daily.   ezetimibe 10 MG tablet Commonly known as: ZETIA Take 1 tablet (10 mg total) by mouth daily.   guaiFENesin 600 MG 12 hr tablet Commonly known as: MUCINEX Take 1 tablet (600 mg total) by mouth 2 (two) times daily.  HumaLOG Junior KwikPen 100 UNIT/ML Generic drug: Insulin lispro Inject 0-10 Units into the skin 3 (three) times daily. insulin Lispro (Humalog) injection 0-10 Units 0-10 Units Subcutaneous, 3 times daily with meals CBG < 70: Implement Hypoglycemia Standing Orders and refer to Hypoglycemia  Standing Orders sidebar report  CBG 70 - 120: 0 unit CBG 121 - 150: 0 unit  CBG 151 - 200: 1 unit CBG 201 - 250: 2 units CBG 251 - 300: 4 units CBG 301 - 350: 6 units  CBG 351 - 400: 8 units  CBG > 400: 10 units   HYDROcodone-acetaminophen 5-325 MG tablet Commonly known as: NORCO/VICODIN Take 1 tablet by mouth every 6 (six) hours as needed for severe pain or moderate pain.   isosorbide mononitrate 60 MG 24 hr tablet Commonly known as: IMDUR TAKE ONE TABLET BY MOUTH TWICE A DAY   lamoTRIgine 200 MG tablet Commonly known as: LAMICTAL Take 1 tablet (200 mg total) by mouth at bedtime.   Levemir FlexTouch 100 UNIT/ML FlexPen Generic drug: insulin detemir Inject 20 Units into the skin at bedtime. Replaces: insulin glargine-yfgn 100 UNIT/ML injection   nitroGLYCERIN 0.4 MG SL tablet Commonly known as: NITROSTAT Place 1 tablet (0.4 mg total) under the tongue every 5 (five) minutes as needed for chest pain.   omega-3 acid ethyl esters 1 g capsule Commonly known as: LOVAZA Take 1 g by mouth 2 (two) times daily.   pantoprazole 40 MG tablet Commonly known as: PROTONIX Take 1 tablet (40 mg total) by mouth daily.   polyethylene glycol powder 17 GM/SCOOP powder Commonly known as: GLYCOLAX/MIRALAX MIX 1 CAPFUL IN 8 OUNCES OF FLUID BY MOUTH TWICE A DAY   potassium chloride 20 MEQ packet Commonly known as: KLOR-CON Take 20 mEq by mouth daily. What changed: Another medication with the same name was removed. Continue taking this medication, and follow the directions you see here.   pregabalin 225 MG capsule Commonly known as: Lyrica Take 1 capsule (225 mg total) by mouth 2 (two) times daily.   Repatha SureClick 637 MG/ML Soaj Generic drug: Evolocumab Inject 1 Dose into the skin every 14 (fourteen) days.   rosuvastatin 20 MG tablet Commonly known as: CRESTOR Take 1 tablet (20 mg total) by mouth daily.   senna-docusate 8.6-50 MG tablet Commonly known as: Senokot-S Take 2 tablets by  mouth at bedtime. What changed: when to take this   spironolactone 25 MG tablet Commonly known as: ALDACTONE TAKE ONE TABLET BY MOUTH ONE TIME DAILY   torsemide 10 MG tablet Commonly known as: DEMADEX Take 1 tablet (10 mg total) by mouth daily. PLEASE SCHEDULE OFFICE VISIT FOR FURTHER REFILLS. THANK YOU!   traZODone 50 MG tablet Commonly known as: DESYREL Take 0.5 tablets (25 mg total) by mouth at bedtime as needed for sleep.   Trulicity 8.58 IF/0.2DX Sopn Generic drug: Dulaglutide Inject 0.75 mg into the skin once a week.   zinc sulfate 220 (50 Zn) MG capsule Take 1 capsule (220 mg total) by mouth daily. Start taking on: December 25, 2021        Major procedures and Radiology Reports - PLEASE review detailed and final reports for all details, in brief    DG Chest Port 1 View  Result Date: 12/21/2021 CLINICAL DATA:  Sepsis EXAM: PORTABLE CHEST 1 VIEW COMPARISON:  11/21/2021 FINDINGS: The heart size and mediastinal contours are within normal limits. Both lungs are clear. The visualized skeletal structures are unremarkable. IMPRESSION: No active disease. Electronically Signed  By: Ulyses Jarred M.D.   On: 12/21/2021 23:08    Micro Results    Recent Results (from the past 240 hour(s))  Resp Panel by RT-PCR (Flu A&B, Covid) Nasopharyngeal Swab     Status: Abnormal   Collection Time: 12/21/21 10:13 PM   Specimen: Nasopharyngeal Swab; Nasopharyngeal(NP) swabs in vial transport medium  Result Value Ref Range Status   SARS Coronavirus 2 by RT PCR POSITIVE (A) NEGATIVE Final    Comment: (NOTE) SARS-CoV-2 target nucleic acids are DETECTED.  The SARS-CoV-2 RNA is generally detectable in upper respiratory specimens during the acute phase of infection. Positive results are indicative of the presence of the identified virus, but do not rule out bacterial infection or co-infection with other pathogens not detected by the test. Clinical correlation with patient history and other  diagnostic information is necessary to determine patient infection status. The expected result is Negative.  Fact Sheet for Patients: EntrepreneurPulse.com.au  Fact Sheet for Healthcare Providers: IncredibleEmployment.be  This test is not yet approved or cleared by the [place] FDA and  has been authorized for detection and/or diagnosis of SARS-CoV-2 by FDA under an Emergency Use Authorization (EUA).  This EUA will remain in effect (meaning this test can be used) for the duration of  the COVID-19 declaration under Section 564(b)(1) of the A ct, 21 U.S.C. section 360bbb-3(b)(1), unless the authorization is terminated or revoked sooner.     Influenza A by PCR NEGATIVE NEGATIVE Final   Influenza B by PCR NEGATIVE NEGATIVE Final    Comment: (NOTE) The Xpert Xpress SARS-CoV-2/FLU/RSV plus assay is intended as an aid in the diagnosis of influenza from Nasopharyngeal swab specimens and should not be used as a sole basis for treatment. Nasal washings and aspirates are unacceptable for Xpert Xpress SARS-CoV-2/FLU/RSV testing.  Fact Sheet for Patients: EntrepreneurPulse.com.au  Fact Sheet for Healthcare Providers: IncredibleEmployment.be  This test is not yet approved or cleared by the [place] FDA and has been authorized for detection and/or diagnosis of SARS-CoV-2 by FDA under an Emergency Use Authorization (EUA). This EUA will remain in effect (meaning this test can be used) for the duration of the COVID-19 declaration under Section 564(b)(1) of the Act, 21 U.S.C. section 360bbb-3(b)(1), unless the authorization is terminated or revoked.  Performed at [facility], [name] [place], [place] [address]   Blood Culture (routine x 2)     Status: None (Preliminary result)   Collection Time: 12/21/21 10:15 PM   Specimen: BLOOD  Result Value Ref Range Status   Specimen Description BLOOD RIGHT  ANTECUBITAL  Final   Special Requests   Final    BOTTLES DRAWN AEROBIC AND ANAEROBIC Blood Culture adequate volume   Culture   Final    NO GROWTH 3 DAYS Performed at [facility], [address], [place], [place] [address]    Report Status PENDING  Incomplete  Blood Culture (routine x 2)     Status: None (Preliminary result)   Collection Time: 12/21/21 10:15 PM   Specimen: BLOOD  Result Value Ref Range Status   Specimen Description BLOOD BLOOD RIGHT WRIST  Final   Special Requests   Final    BOTTLES DRAWN AEROBIC AND ANAEROBIC Blood Culture adequate volume   Culture   Final    NO GROWTH 3 DAYS Performed at [facility], [address], [place], [place] [address]    Report Status PENDING  Incomplete  Urine Culture     Status: None   Collection Time: 12/22/21  12:27 AM   Specimen: Urine, Clean Catch  Result Value Ref Range Status   Specimen Description   Final    URINE, CLEAN CATCH Performed at [facility], [address], [place], [place] [address]    Special Requests   Final    NONE Performed at [facility], [address] [place], [place] [address]    Culture   Final    NO GROWTH Performed at [facility], [place] [address], [place], [place] [address]    Report Status 12/23/2021 FINAL  Final       Today   Subjective    Emine Lopata today has no new complaints  '        No fever  Or chills   No Nausea, Vomiting or Diarrhea  Patient has been seen and examined prior to discharge   Objective   Blood pressure (!) 154/83, pulse (!) 59, temperature 98.1 F (36.7 C), temperature source Oral, resp. rate 16, height 5\' 7"  (1.702 m), weight 94.8 kg, SpO2 94 %.   Intake/Output Summary (Last 24 hours) at 12/24/2021 1324 Last data filed at 12/24/2021 0300 Gross per 24 hour  Intake 870.42 ml  Output --  Net 870.42 ml    Exam Gen:- Awake Alert, in no acute distress HEENT:- [place].AT, No sclera icterus Neck-Supple Neck,No JVD,.  Scar from prior right carotid  endarterectomy Lungs-no significant wheezing, fair symmetrical air movement CV- S1, S2 normal, regular , prior sternotomy scar Abd-  +ve B.Sounds, Abd Soft, No tenderness,    Extremity/Skin:-   pedal pulses present , Lt ankle without significant erythema or new findings today Psych-affect is appropriate, oriented x3 Neuro-residual left-sided hemiparesis , no new focal deficits, no tremors   Data Review   CBC w Diff:  Lab Results  Component Value Date   WBC 10.8 (H) 12/24/2021   HGB 13.5 12/24/2021   HGB 14.2 09/11/2021   HCT 42.7 12/24/2021   HCT 43.5 09/11/2021   PLT 289 12/24/2021   PLT 433 09/11/2021   LYMPHOPCT 12 12/24/2021   LYMPHOPCT 29.7 01/11/2014   BANDSPCT 2 09/15/2019   MONOPCT 4 12/24/2021   MONOPCT 5.9 01/11/2014   EOSPCT 0 12/24/2021   EOSPCT 2.9 01/11/2014   BASOPCT 0 12/24/2021   BASOPCT 1.2 01/11/2014    CMP:  Lab Results  Component Value Date   NA 134 (L) 12/21/2021   NA 138 09/11/2021   NA 135 (L) 01/11/2015   K 3.6 12/21/2021   K 3.9 01/11/2015   CL 96 (L) 12/21/2021   CL 100 01/11/2015   CO2 29 12/21/2021   CO2 27 01/11/2015   BUN 15 12/21/2021   BUN 10 09/11/2021   BUN 6 (L) 01/11/2015   CREATININE 0.96 12/21/2021   CREATININE 0.77 01/11/2015   PROT 7.9 12/21/2021   PROT 7.4 09/11/2021   PROT 7.8 01/11/2015   ALBUMIN 4.2 12/21/2021   ALBUMIN 4.5 09/11/2021   ALBUMIN 3.4 01/11/2015   BILITOT 0.1 (L) 12/21/2021   BILITOT 0.2 09/11/2021   BILITOT 0.4 01/11/2015   ALKPHOS 61 12/21/2021   ALKPHOS 91 01/11/2015   AST 31 12/21/2021   AST 70 (H) 01/11/2015   ALT 28 12/21/2021   ALT 85 (H) 01/11/2015  .   Total Discharge time is about 33 minutes  Roxan Hockey M.D on 12/24/2021 at 1:24 PM  Go to [email or web address] -  for contact info  [facility] - Office  [phone]

## 2021-12-24 NOTE — Progress Notes (Signed)
Physical Therapy Treatment Patient Details Name: Erika [name] MRN: [id number] DOB: 08-10-1971 Today's Date: 12/24/2021   History of Present Illness Erika [name] is a 51 y.o. female with medical history significant for prior stroke with residual left-sided hemiparesis of her arm and partial paresis of her leg,  coronary artery disease s/p bypass grafting, carotid artery disease s/p CEA, diastolic dysfunction, depression, PAD and PTSD who presents to the emergency department via EMS due to reported altered mental status.  Patient's mental status appeared to be at baseline on arrival to the ED, patient endorsed increased urinary frequency within last couple of days, but she denies burning sensation on urination or any other irritative bladder symptoms.  Patient endorsed subjective fever, but denies chills, cough, chest congestion or nasal congestion.    PT Comments    Patient presents seated at EOB and agreeable for therapy.  Patient demonstrates increased endurance/distance for taking steps in room with slightly labored cadence, increased time for making turns and occasional bumping into nearby objects without loss of balance using Hemi-walker.  Patient continued sitting up at bedside after therapy.  Patient will benefit from continued skilled physical therapy in hospital and recommended venue below to increase strength, balance, endurance for safe ADLs and gait.     Recommendations for follow up therapy are one component of a multi-disciplinary discharge planning process, led by the attending physician.  Recommendations may be updated based on patient status, additional functional criteria and insurance authorization.  Follow Up Recommendations  Skilled nursing-short term rehab (<3 hours/day)     Assistance Recommended at Discharge Intermittent Supervision/Assistance  Equipment Recommendations  None recommended by PT    Recommendations for Other Services       Precautions /  Restrictions Precautions Precautions: Fall Precaution Comments: Hx recurrent falls Required Braces or Orthoses: Other Brace Other Brace: CAM boot on LLE Restrictions Weight Bearing Restrictions: Yes LLE Weight Bearing: Weight bearing as tolerated Other Position/Activity Restrictions: WBAT LLE with CAM boot     Mobility  Bed Mobility               General bed mobility comments: Patient presents seated at EOB    Transfers Overall transfer level: Needs assistance Equipment used: Hemi-walker Transfers: Sit to/from Stand;Bed to chair/wheelchair/BSC Sit to Stand: Min guard;Min assist     Step pivot transfers: Min guard;Min assist     General transfer comment: slightly labored movement with good return for using Hemi-walker    Ambulation/Gait Ambulation/Gait assistance: Min guard;Min assist Gait Distance (Feet): 35 Feet Assistive device: Hemi-walker Gait Pattern/deviations: Step-to pattern;Decreased step length - right;Decreased step length - left;Decreased stance time - left;Decreased stride length;Decreased dorsiflexion - left Gait velocity: decreased     General Gait Details: increased endurance/distance for ambulation with slightly labored cadence, increased time making turns and occasional bumping into nearby objects without loss of balance   Stairs             Wheelchair Mobility    Modified Rankin (Stroke Patients Only)       Balance Overall balance assessment: Needs assistance Sitting-balance support: Feet supported;No upper extremity supported Sitting balance-Leahy Scale: Good Sitting balance - Comments: seated at EOB   Standing balance support: During functional activity;Single extremity supported;Reliant on assistive device for balance Standing balance-Leahy Scale: Fair Standing balance comment: fair/good using Hemi-walker                            Cognition Arousal/Alertness: Awake/alert  Behavior During Therapy: WFL for tasks  assessed/performed Overall Cognitive Status: Within Functional Limits for tasks assessed                                          Exercises General Exercises - Lower Extremity Long Arc Quad: Seated;AROM;Strengthening;Both;10 reps Hip Flexion/Marching: Seated;AROM;Strengthening;Both;10 reps Toe Raises: Seated;AROM;Strengthening;Both;10 reps Heel Raises: Seated;AROM;Strengthening;10 reps;Both    General Comments        Pertinent Vitals/Pain Pain Assessment: No/denies pain    Home Living                          Prior Function            PT Goals (current goals can now be found in the care plan section) Acute Rehab PT Goals Patient Stated Goal: return home after rehab PT Goal Formulation: With patient Time For Goal Achievement: 01/05/22 Potential to Achieve Goals: Good Progress towards PT goals: Progressing toward goals    Frequency    Min 3X/week      PT Plan Current plan remains appropriate    Co-evaluation              AM-PAC PT "6 Clicks" Mobility   Outcome Measure  Help needed turning from your back to your side while in a flat bed without using bedrails?: A Little Help needed moving from lying on your back to sitting on the side of a flat bed without using bedrails?: A Little Help needed moving to and from a bed to a chair (including a wheelchair)?: A Little Help needed standing up from a chair using your arms (e.g., wheelchair or bedside chair)?: A Little Help needed to walk in hospital room?: A Lot Help needed climbing 3-5 steps with a railing? : A Lot 6 Click Score: 16    End of Session   Activity Tolerance: Patient tolerated treatment well;Patient limited by fatigue Patient left: in bed;with call bell/phone within reach;Other (comment) (left seated at EOB) Nurse Communication: Mobility status PT Visit Diagnosis: Unsteadiness on feet (R26.81);Other abnormalities of gait and mobility (R26.89);Muscle weakness  (generalized) (M62.81)     Time: 5102-5852 PT Time Calculation (min) (ACUTE ONLY): 26 min  Charges:  $Gait Training: 8-22 mins $Therapeutic Exercise: 8-22 mins                     11:51 AM, 12/24/21 Lonell Grandchild, MPT Physical Therapist with [facility] [phone] office [phone] mobile phone

## 2021-12-25 LAB — LEGIONELLA PNEUMOPHILA SEROGP 1 UR AG: L. pneumophila Serogp 1 Ur Ag: NEGATIVE

## 2021-12-26 ENCOUNTER — Encounter: Payer: Managed Care, Other (non HMO) | Admitting: Speech Pathology

## 2021-12-26 ENCOUNTER — Ambulatory Visit: Payer: Managed Care, Other (non HMO)

## 2021-12-26 LAB — CULTURE, BLOOD (ROUTINE X 2)
Culture: NO GROWTH
Culture: NO GROWTH
Special Requests: ADEQUATE
Special Requests: ADEQUATE

## 2021-12-27 DIAGNOSIS — E1169 Type 2 diabetes mellitus with other specified complication: Secondary | ICD-10-CM | POA: Diagnosis not present

## 2021-12-27 DIAGNOSIS — I5032 Chronic diastolic (congestive) heart failure: Secondary | ICD-10-CM | POA: Diagnosis not present

## 2021-12-27 DIAGNOSIS — I6350 Cerebral infarction due to unspecified occlusion or stenosis of unspecified cerebral artery: Secondary | ICD-10-CM | POA: Diagnosis not present

## 2021-12-28 ENCOUNTER — Encounter: Payer: Managed Care, Other (non HMO) | Admitting: Speech Pathology

## 2021-12-29 ENCOUNTER — Telehealth: Payer: Managed Care, Other (non HMO) | Admitting: Internal Medicine

## 2022-01-01 ENCOUNTER — Ambulatory Visit: Payer: BC Managed Care – PPO | Admitting: Speech Pathology

## 2022-01-01 ENCOUNTER — Ambulatory Visit: Payer: BC Managed Care – PPO

## 2022-01-01 ENCOUNTER — Ambulatory Visit: Payer: BC Managed Care – PPO | Admitting: Occupational Therapy

## 2022-01-03 ENCOUNTER — Ambulatory Visit: Payer: BC Managed Care – PPO

## 2022-01-03 ENCOUNTER — Ambulatory Visit: Payer: BC Managed Care – PPO | Admitting: Speech Pathology

## 2022-01-08 ENCOUNTER — Encounter: Payer: Managed Care, Other (non HMO) | Admitting: Speech Pathology

## 2022-01-08 ENCOUNTER — Ambulatory Visit: Payer: Managed Care, Other (non HMO)

## 2022-01-09 ENCOUNTER — Telehealth: Payer: Self-pay

## 2022-01-09 DIAGNOSIS — S82892A Other fracture of left lower leg, initial encounter for closed fracture: Secondary | ICD-10-CM | POA: Diagnosis not present

## 2022-01-09 NOTE — Telephone Encounter (Signed)
PA for Repatha Sureclick submitted (Key: [id number])

## 2022-01-09 NOTE — Telephone Encounter (Signed)
Per PA through Covermymeds   Erika [name] (Key: [id number]) Repatha SureClick 140MG /ML auto-injectors   Drug is not covered by plan

## 2022-01-09 NOTE — Telephone Encounter (Signed)
Looks like per [employer] is not covered by plan Looks like patient sees Dr. Debara Pickett in the [facility] but not sure of the pool

## 2022-01-09 NOTE — Telephone Encounter (Signed)
Sent MyChart message asking for insurance info for Repatha PA If she changed insurance plans from last year, may need different [id number]

## 2022-01-10 ENCOUNTER — Ambulatory Visit: Payer: Managed Care, Other (non HMO)

## 2022-01-10 ENCOUNTER — Telehealth: Payer: Self-pay

## 2022-01-10 ENCOUNTER — Other Ambulatory Visit: Payer: Self-pay | Admitting: Physical Medicine and Rehabilitation

## 2022-01-10 ENCOUNTER — Encounter: Payer: Managed Care, Other (non HMO) | Admitting: Speech Pathology

## 2022-01-10 ENCOUNTER — Other Ambulatory Visit (HOSPITAL_COMMUNITY): Payer: Self-pay | Admitting: *Deleted

## 2022-01-10 ENCOUNTER — Other Ambulatory Visit: Payer: Self-pay | Admitting: Cardiovascular Disease

## 2022-01-10 MED ORDER — ESCITALOPRAM OXALATE 20 MG PO TABS: 20.0000 mg | ORAL_TABLET | Freq: Every day | ORAL | 0 refills | Status: DC

## 2022-01-10 NOTE — Telephone Encounter (Signed)
[id number] approved for Repatha by [employer] until 12/23/2022.

## 2022-01-11 DIAGNOSIS — S8252XD Displaced fracture of medial malleolus of left tibia, subsequent encounter for closed fracture with routine healing: Secondary | ICD-10-CM | POA: Diagnosis not present

## 2022-01-11 NOTE — Telephone Encounter (Signed)
Medication approved until 12/23/22

## 2022-01-15 ENCOUNTER — Encounter: Payer: Managed Care, Other (non HMO) | Admitting: Speech Pathology

## 2022-01-15 ENCOUNTER — Ambulatory Visit: Payer: Managed Care, Other (non HMO)

## 2022-01-16 ENCOUNTER — Inpatient Hospital Stay: Payer: Self-pay | Admitting: Family

## 2022-01-17 ENCOUNTER — Ambulatory Visit: Payer: Managed Care, Other (non HMO)

## 2022-01-17 ENCOUNTER — Encounter: Payer: Managed Care, Other (non HMO) | Admitting: Speech Pathology

## 2022-01-18 ENCOUNTER — Inpatient Hospital Stay: Payer: Self-pay | Admitting: Family

## 2022-01-22 ENCOUNTER — Encounter: Payer: Managed Care, Other (non HMO) | Admitting: Speech Pathology

## 2022-01-22 ENCOUNTER — Ambulatory Visit: Payer: Managed Care, Other (non HMO)

## 2022-01-24 ENCOUNTER — Ambulatory Visit: Payer: Medicare Other | Admitting: Neurology

## 2022-01-24 ENCOUNTER — Other Ambulatory Visit: Payer: Self-pay | Admitting: Physical Medicine and Rehabilitation

## 2022-01-25 ENCOUNTER — Encounter: Payer: Self-pay | Admitting: Physical Medicine and Rehabilitation

## 2022-01-25 ENCOUNTER — Ambulatory Visit: Payer: BC Managed Care – PPO

## 2022-01-25 ENCOUNTER — Other Ambulatory Visit: Payer: Self-pay | Admitting: Physical Medicine and Rehabilitation

## 2022-01-25 DIAGNOSIS — J9601 Acute respiratory failure with hypoxia: Secondary | ICD-10-CM | POA: Diagnosis not present

## 2022-01-25 DIAGNOSIS — I214 Non-ST elevation (NSTEMI) myocardial infarction: Secondary | ICD-10-CM | POA: Diagnosis not present

## 2022-01-25 DIAGNOSIS — I639 Cerebral infarction, unspecified: Secondary | ICD-10-CM

## 2022-01-25 DIAGNOSIS — A419 Sepsis, unspecified organism: Secondary | ICD-10-CM | POA: Diagnosis not present

## 2022-01-25 DIAGNOSIS — R6520 Severe sepsis without septic shock: Secondary | ICD-10-CM | POA: Diagnosis not present

## 2022-01-26 ENCOUNTER — Inpatient Hospital Stay: Payer: BC Managed Care – PPO

## 2022-01-26 ENCOUNTER — Other Ambulatory Visit: Payer: Self-pay

## 2022-01-26 ENCOUNTER — Inpatient Hospital Stay
Admission: EM | Admit: 2022-01-26 | Discharge: 2022-02-01 | DRG: 092 | Disposition: A | Discharge status: Skilled Nursing Facility | Payer: BC Managed Care – PPO | Attending: Internal Medicine | Admitting: Internal Medicine

## 2022-01-26 ENCOUNTER — Emergency Department: Payer: BC Managed Care – PPO

## 2022-01-26 ENCOUNTER — Inpatient Hospital Stay: Payer: BC Managed Care – PPO | Admitting: Family

## 2022-01-26 ENCOUNTER — Encounter: Payer: Self-pay | Admitting: Radiology

## 2022-01-26 DIAGNOSIS — E861 Hypovolemia: Secondary | ICD-10-CM | POA: Diagnosis present

## 2022-01-26 DIAGNOSIS — E785 Hyperlipidemia, unspecified: Secondary | ICD-10-CM | POA: Diagnosis present

## 2022-01-26 DIAGNOSIS — T4395XA Adverse effect of unspecified psychotropic drug, initial encounter: Secondary | ICD-10-CM | POA: Diagnosis present

## 2022-01-26 DIAGNOSIS — R6520 Severe sepsis without septic shock: Secondary | ICD-10-CM

## 2022-01-26 DIAGNOSIS — E669 Obesity, unspecified: Secondary | ICD-10-CM | POA: Diagnosis not present

## 2022-01-26 DIAGNOSIS — T4275XA Adverse effect of unspecified antiepileptic and sedative-hypnotic drugs, initial encounter: Secondary | ICD-10-CM | POA: Diagnosis not present

## 2022-01-26 DIAGNOSIS — I2510 Atherosclerotic heart disease of native coronary artery without angina pectoris: Secondary | ICD-10-CM | POA: Diagnosis not present

## 2022-01-26 DIAGNOSIS — G44009 Cluster headache syndrome, unspecified, not intractable: Secondary | ICD-10-CM | POA: Diagnosis present

## 2022-01-26 DIAGNOSIS — Z79891 Long term (current) use of opiate analgesic: Secondary | ICD-10-CM

## 2022-01-26 DIAGNOSIS — G894 Chronic pain syndrome: Secondary | ICD-10-CM | POA: Diagnosis present

## 2022-01-26 DIAGNOSIS — I252 Old myocardial infarction: Secondary | ICD-10-CM

## 2022-01-26 DIAGNOSIS — K219 Gastro-esophageal reflux disease without esophagitis: Secondary | ICD-10-CM | POA: Diagnosis present

## 2022-01-26 DIAGNOSIS — I69354 Hemiplegia and hemiparesis following cerebral infarction affecting left non-dominant side: Secondary | ICD-10-CM

## 2022-01-26 DIAGNOSIS — R29898 Other symptoms and signs involving the musculoskeletal system: Secondary | ICD-10-CM | POA: Diagnosis not present

## 2022-01-26 DIAGNOSIS — R778 Other specified abnormalities of plasma proteins: Secondary | ICD-10-CM | POA: Diagnosis not present

## 2022-01-26 DIAGNOSIS — K760 Fatty (change of) liver, not elsewhere classified: Secondary | ICD-10-CM | POA: Diagnosis not present

## 2022-01-26 DIAGNOSIS — I959 Hypotension, unspecified: Secondary | ICD-10-CM | POA: Diagnosis not present

## 2022-01-26 DIAGNOSIS — I5032 Chronic diastolic (congestive) heart failure: Secondary | ICD-10-CM | POA: Diagnosis present

## 2022-01-26 DIAGNOSIS — I739 Peripheral vascular disease, unspecified: Secondary | ICD-10-CM | POA: Diagnosis not present

## 2022-01-26 DIAGNOSIS — R2689 Other abnormalities of gait and mobility: Secondary | ICD-10-CM | POA: Diagnosis not present

## 2022-01-26 DIAGNOSIS — F4310 Post-traumatic stress disorder, unspecified: Secondary | ICD-10-CM | POA: Diagnosis present

## 2022-01-26 DIAGNOSIS — I248 Other forms of acute ischemic heart disease: Secondary | ICD-10-CM | POA: Diagnosis present

## 2022-01-26 DIAGNOSIS — E876 Hypokalemia: Secondary | ICD-10-CM | POA: Diagnosis present

## 2022-01-26 DIAGNOSIS — Z823 Family history of stroke: Secondary | ICD-10-CM

## 2022-01-26 DIAGNOSIS — Z794 Long term (current) use of insulin: Secondary | ICD-10-CM

## 2022-01-26 DIAGNOSIS — F330 Major depressive disorder, recurrent, mild: Secondary | ICD-10-CM | POA: Diagnosis present

## 2022-01-26 DIAGNOSIS — I25118 Atherosclerotic heart disease of native coronary artery with other forms of angina pectoris: Secondary | ICD-10-CM

## 2022-01-26 DIAGNOSIS — I110 Hypertensive heart disease with heart failure: Secondary | ICD-10-CM | POA: Diagnosis present

## 2022-01-26 DIAGNOSIS — Z833 Family history of diabetes mellitus: Secondary | ICD-10-CM

## 2022-01-26 DIAGNOSIS — G629 Polyneuropathy, unspecified: Secondary | ICD-10-CM

## 2022-01-26 DIAGNOSIS — R29810 Facial weakness: Secondary | ICD-10-CM | POA: Diagnosis present

## 2022-01-26 DIAGNOSIS — L8990 Pressure ulcer of unspecified site, unspecified stage: Secondary | ICD-10-CM | POA: Diagnosis present

## 2022-01-26 DIAGNOSIS — L89152 Pressure ulcer of sacral region, stage 2: Secondary | ICD-10-CM | POA: Diagnosis present

## 2022-01-26 DIAGNOSIS — I6502 Occlusion and stenosis of left vertebral artery: Secondary | ICD-10-CM | POA: Diagnosis not present

## 2022-01-26 DIAGNOSIS — Z20822 Contact with and (suspected) exposure to covid-19: Secondary | ICD-10-CM | POA: Diagnosis not present

## 2022-01-26 DIAGNOSIS — A419 Sepsis, unspecified organism: Secondary | ICD-10-CM | POA: Diagnosis present

## 2022-01-26 DIAGNOSIS — Z7982 Long term (current) use of aspirin: Secondary | ICD-10-CM

## 2022-01-26 DIAGNOSIS — I69398 Other sequelae of cerebral infarction: Secondary | ICD-10-CM | POA: Diagnosis not present

## 2022-01-26 DIAGNOSIS — Z8249 Family history of ischemic heart disease and other diseases of the circulatory system: Secondary | ICD-10-CM

## 2022-01-26 DIAGNOSIS — Y95 Nosocomial condition: Secondary | ICD-10-CM | POA: Diagnosis present

## 2022-01-26 DIAGNOSIS — G9341 Metabolic encephalopathy: Secondary | ICD-10-CM | POA: Diagnosis not present

## 2022-01-26 DIAGNOSIS — I651 Occlusion and stenosis of basilar artery: Secondary | ICD-10-CM | POA: Diagnosis not present

## 2022-01-26 DIAGNOSIS — E1169 Type 2 diabetes mellitus with other specified complication: Secondary | ICD-10-CM | POA: Diagnosis not present

## 2022-01-26 DIAGNOSIS — I517 Cardiomegaly: Secondary | ICD-10-CM

## 2022-01-26 DIAGNOSIS — I701 Atherosclerosis of renal artery: Secondary | ICD-10-CM | POA: Diagnosis present

## 2022-01-26 DIAGNOSIS — R079 Chest pain, unspecified: Secondary | ICD-10-CM | POA: Diagnosis present

## 2022-01-26 DIAGNOSIS — E1151 Type 2 diabetes mellitus with diabetic peripheral angiopathy without gangrene: Secondary | ICD-10-CM | POA: Diagnosis not present

## 2022-01-26 DIAGNOSIS — I5022 Chronic systolic (congestive) heart failure: Secondary | ICD-10-CM | POA: Diagnosis not present

## 2022-01-26 DIAGNOSIS — I6529 Occlusion and stenosis of unspecified carotid artery: Secondary | ICD-10-CM | POA: Diagnosis present

## 2022-01-26 DIAGNOSIS — I6523 Occlusion and stenosis of bilateral carotid arteries: Secondary | ICD-10-CM | POA: Diagnosis not present

## 2022-01-26 DIAGNOSIS — I6623 Occlusion and stenosis of bilateral posterior cerebral arteries: Secondary | ICD-10-CM | POA: Diagnosis not present

## 2022-01-26 DIAGNOSIS — Z7901 Long term (current) use of anticoagulants: Secondary | ICD-10-CM

## 2022-01-26 DIAGNOSIS — J9601 Acute respiratory failure with hypoxia: Secondary | ICD-10-CM | POA: Diagnosis not present

## 2022-01-26 DIAGNOSIS — U099 Post covid-19 condition, unspecified: Secondary | ICD-10-CM | POA: Diagnosis present

## 2022-01-26 DIAGNOSIS — R531 Weakness: Secondary | ICD-10-CM | POA: Diagnosis not present

## 2022-01-26 DIAGNOSIS — J189 Pneumonia, unspecified organism: Secondary | ICD-10-CM | POA: Diagnosis not present

## 2022-01-26 DIAGNOSIS — Z66 Do not resuscitate: Secondary | ICD-10-CM | POA: Diagnosis not present

## 2022-01-26 DIAGNOSIS — R2990 Unspecified symptoms and signs involving the nervous system: Secondary | ICD-10-CM

## 2022-01-26 DIAGNOSIS — Z951 Presence of aortocoronary bypass graft: Secondary | ICD-10-CM | POA: Diagnosis not present

## 2022-01-26 DIAGNOSIS — I2489 Other forms of acute ischemic heart disease: Secondary | ICD-10-CM

## 2022-01-26 DIAGNOSIS — F329 Major depressive disorder, single episode, unspecified: Secondary | ICD-10-CM | POA: Diagnosis present

## 2022-01-26 DIAGNOSIS — I6350 Cerebral infarction due to unspecified occlusion or stenosis of unspecified cerebral artery: Secondary | ICD-10-CM | POA: Diagnosis not present

## 2022-01-26 DIAGNOSIS — I63219 Cerebral infarction due to unspecified occlusion or stenosis of unspecified vertebral arteries: Secondary | ICD-10-CM | POA: Diagnosis present

## 2022-01-26 DIAGNOSIS — G928 Other toxic encephalopathy: Secondary | ICD-10-CM | POA: Diagnosis not present

## 2022-01-26 DIAGNOSIS — I25810 Atherosclerosis of coronary artery bypass graft(s) without angina pectoris: Secondary | ICD-10-CM | POA: Diagnosis not present

## 2022-01-26 DIAGNOSIS — M6281 Muscle weakness (generalized): Secondary | ICD-10-CM | POA: Diagnosis not present

## 2022-01-26 DIAGNOSIS — Z818 Family history of other mental and behavioral disorders: Secondary | ICD-10-CM

## 2022-01-26 DIAGNOSIS — Z7902 Long term (current) use of antithrombotics/antiplatelets: Secondary | ICD-10-CM

## 2022-01-26 DIAGNOSIS — I214 Non-ST elevation (NSTEMI) myocardial infarction: Secondary | ICD-10-CM

## 2022-01-26 DIAGNOSIS — Z79899 Other long term (current) drug therapy: Secondary | ICD-10-CM

## 2022-01-26 DIAGNOSIS — Z888 Allergy status to other drugs, medicaments and biological substances status: Secondary | ICD-10-CM

## 2022-01-26 DIAGNOSIS — Z7952 Long term (current) use of systemic steroids: Secondary | ICD-10-CM

## 2022-01-26 DIAGNOSIS — R7989 Other specified abnormal findings of blood chemistry: Secondary | ICD-10-CM | POA: Diagnosis present

## 2022-01-26 DIAGNOSIS — I10 Essential (primary) hypertension: Secondary | ICD-10-CM | POA: Diagnosis present

## 2022-01-26 LAB — DIFFERENTIAL
Abs Immature Granulocytes: 0.08 10*3/uL — ABNORMAL HIGH (ref 0.00–0.07)
Basophils Absolute: 0 10*3/uL (ref 0.0–0.1)
Basophils Relative: 0 %
Eosinophils Absolute: 0.1 10*3/uL (ref 0.0–0.5)
Eosinophils Relative: 1 %
Immature Granulocytes: 0 %
Lymphocytes Relative: 15 %
Lymphs Abs: 2.7 10*3/uL (ref 0.7–4.0)
Monocytes Absolute: 0.6 10*3/uL (ref 0.1–1.0)
Monocytes Relative: 3 %
Neutro Abs: 14.6 10*3/uL — ABNORMAL HIGH (ref 1.7–7.7)
Neutrophils Relative %: 81 %

## 2022-01-26 LAB — CBC
HCT: 38 % (ref 36.0–46.0)
Hemoglobin: 11.8 g/dL — ABNORMAL LOW (ref 12.0–15.0)
MCH: 27.9 pg (ref 26.0–34.0)
MCHC: 31.1 g/dL (ref 30.0–36.0)
MCV: 89.8 fL (ref 80.0–100.0)
Platelets: 295 10*3/uL (ref 150–400)
RBC: 4.23 MIL/uL (ref 3.87–5.11)
RDW: 15.9 % — ABNORMAL HIGH (ref 11.5–15.5)
WBC: 18.1 10*3/uL — ABNORMAL HIGH (ref 4.0–10.5)
nRBC: 0 % (ref 0.0–0.2)

## 2022-01-26 LAB — COMPREHENSIVE METABOLIC PANEL
ALT: 16 U/L (ref 0–44)
AST: 30 U/L (ref 15–41)
Albumin: 3.4 g/dL — ABNORMAL LOW (ref 3.5–5.0)
Alkaline Phosphatase: 52 U/L (ref 38–126)
Anion gap: 10 (ref 5–15)
BUN: 16 mg/dL (ref 6–20)
CO2: 29 mmol/L (ref 22–32)
Calcium: 9.4 mg/dL (ref 8.9–10.3)
Chloride: 99 mmol/L (ref 98–111)
Creatinine, Ser: 0.86 mg/dL (ref 0.44–1.00)
GFR, Estimated: >60 mL/min (ref >60)
Glucose, Bld: 107 mg/dL — ABNORMAL HIGH (ref 70–99)
Potassium: 3.6 mmol/L (ref 3.5–5.1)
Sodium: 138 mmol/L (ref 135–145)
Total Bilirubin: 0.5 mg/dL (ref 0.3–1.2)
Total Protein: 7.1 g/dL (ref 6.5–8.1)

## 2022-01-26 LAB — LACTIC ACID, PLASMA
Lactic Acid, Venous: 1.7 mmol/L (ref 0.5–1.9)
Lactic Acid, Venous: 1.9 mmol/L (ref 0.5–1.9)

## 2022-01-26 LAB — PROTIME-INR
INR: 1.2 (ref 0.8–1.2)
Prothrombin Time: 15 seconds (ref 11.4–15.2)

## 2022-01-26 LAB — APTT: aPTT: 35 seconds (ref 24–36)

## 2022-01-26 LAB — BRAIN NATRIURETIC PEPTIDE: B Natriuretic Peptide: 351.5 pg/mL — ABNORMAL HIGH (ref 0.0–100.0)

## 2022-01-26 LAB — GLUCOSE, CAPILLARY: Glucose-Capillary: 78 mg/dL (ref 70–99)

## 2022-01-26 LAB — TROPONIN I (HIGH SENSITIVITY)
Troponin I (High Sensitivity): 2535 ng/L (ref <18)
Troponin I (High Sensitivity): 2613 ng/L (ref <18)
Troponin I (High Sensitivity): 2196 ng/L (ref <18)

## 2022-01-26 LAB — PROCALCITONIN: Procalcitonin: 0.62 ng/mL

## 2022-01-26 LAB — RESP PANEL BY RT-PCR (FLU A&B, COVID) ARPGX2
Influenza A by PCR: NEGATIVE
Influenza B by PCR: NEGATIVE
SARS Coronavirus 2 by RT PCR: NEGATIVE

## 2022-01-26 MED ORDER — ASPIRIN 81 MG PO CHEW
324.0000 mg | CHEWABLE_TABLET | Freq: Once | ORAL | Status: AC
  Administered 2022-01-26: 324 mg via ORAL
  Filled 2022-01-26 (×2): qty 4

## 2022-01-26 MED ORDER — HEPARIN BOLUS VIA INFUSION
4000.0000 [IU] | Freq: Once | INTRAVENOUS | Status: DC
  Filled 2022-01-26: qty 4000

## 2022-01-26 MED ORDER — CLOPIDOGREL BISULFATE 75 MG PO TABS
75.0000 mg | ORAL_TABLET | Freq: Every day | ORAL | Status: DC
  Administered 2022-01-27 – 2022-02-01 (×5): 75 mg via ORAL
  Filled 2022-01-26 (×5): qty 1

## 2022-01-26 MED ORDER — LACTATED RINGERS IV SOLN: INTRAVENOUS | Status: AC

## 2022-01-26 MED ORDER — CHLORPROMAZINE HCL 100 MG PO TABS
100.0000 mg | ORAL_TABLET | Freq: Every day | ORAL | Status: DC
  Administered 2022-01-26: 100 mg via ORAL
  Filled 2022-01-26 (×3): qty 1

## 2022-01-26 MED ORDER — SODIUM CHLORIDE 0.9% FLUSH
3.0000 mL | Freq: Once | INTRAVENOUS | Status: AC
  Administered 2022-01-26: 3 mL via INTRAVENOUS

## 2022-01-26 MED ORDER — HEPARIN BOLUS VIA INFUSION
2000.0000 [IU] | Freq: Once | INTRAVENOUS | Status: AC
  Administered 2022-01-26: 2000 [IU] via INTRAVENOUS
  Filled 2022-01-26: qty 2000

## 2022-01-26 MED ORDER — OXYCODONE HCL 5 MG PO TABS: 5.0000 mg | ORAL_TABLET | ORAL | Status: DC | PRN

## 2022-01-26 MED ORDER — BENZTROPINE MESYLATE 0.5 MG PO TABS
0.5000 mg | ORAL_TABLET | Freq: Every day | ORAL | Status: DC
  Administered 2022-01-26: 0.5 mg via ORAL
  Filled 2022-01-26 (×2): qty 1

## 2022-01-26 MED ORDER — HEPARIN (PORCINE) 25000 UT/250ML-% IV SOLN: 1100.0000 [IU]/h | INTRAVENOUS | Status: DC

## 2022-01-26 MED ORDER — INSULIN ASPART 100 UNIT/ML IJ SOLN: 0.0000 [IU] | Freq: Three times a day (TID) | INTRAMUSCULAR | Status: DC

## 2022-01-26 MED ORDER — ICOSAPENT ETHYL 1 G PO CAPS
2.0000 g | ORAL_CAPSULE | Freq: Two times a day (BID) | ORAL | Status: DC
  Administered 2022-01-26 – 2022-02-01 (×12): 2 g via ORAL
  Filled 2022-01-26 (×14): qty 2

## 2022-01-26 MED ORDER — FLUOXETINE HCL 20 MG PO CAPS
60.0000 mg | ORAL_CAPSULE | Freq: Every day | ORAL | Status: DC
  Administered 2022-01-27: 60 mg via ORAL
  Filled 2022-01-26: qty 3

## 2022-01-26 MED ORDER — TRAZODONE HCL 50 MG PO TABS: 25.0000 mg | ORAL_TABLET | Freq: Every evening | ORAL | Status: DC | PRN

## 2022-01-26 MED ORDER — SODIUM CHLORIDE 0.9 % IV SOLN
500.0000 mg | INTRAVENOUS | Status: DC
  Administered 2022-01-26: 500 mg via INTRAVENOUS
  Filled 2022-01-26 (×2): qty 5

## 2022-01-26 MED ORDER — PREGABALIN 75 MG PO CAPS
225.0000 mg | ORAL_CAPSULE | Freq: Two times a day (BID) | ORAL | Status: DC
  Administered 2022-01-26 – 2022-01-27 (×2): 225 mg via ORAL
  Filled 2022-01-26 (×2): qty 3

## 2022-01-26 MED ORDER — HEPARIN (PORCINE) 25000 UT/250ML-% IV SOLN
1450.0000 [IU]/h | INTRAVENOUS | Status: AC
  Administered 2022-01-26: 1100 [IU]/h via INTRAVENOUS
  Administered 2022-01-27 – 2022-01-28 (×2): 1450 [IU]/h via INTRAVENOUS
  Filled 2022-01-26 (×3): qty 250

## 2022-01-26 MED ORDER — ROSUVASTATIN CALCIUM 10 MG PO TABS
20.0000 mg | ORAL_TABLET | Freq: Every day | ORAL | Status: DC
  Administered 2022-01-27 – 2022-02-01 (×6): 20 mg via ORAL
  Filled 2022-01-26 (×7): qty 2

## 2022-01-26 MED ORDER — PANTOPRAZOLE SODIUM 40 MG IV SOLR
40.0000 mg | INTRAVENOUS | Status: DC
  Administered 2022-01-26: 40 mg via INTRAVENOUS
  Filled 2022-01-26: qty 40

## 2022-01-26 MED ORDER — IOHEXOL 350 MG/ML SOLN
100.0000 mL | Freq: Once | INTRAVENOUS | Status: AC | PRN
  Administered 2022-01-26: 100 mL via INTRAVENOUS

## 2022-01-26 MED ORDER — LAMOTRIGINE 100 MG PO TABS
200.0000 mg | ORAL_TABLET | Freq: Every day | ORAL | Status: DC
  Administered 2022-01-26 – 2022-01-31 (×6): 200 mg via ORAL
  Filled 2022-01-26 (×6): qty 2

## 2022-01-26 MED ORDER — SODIUM CHLORIDE 0.9 % IV SOLN
2.0000 g | INTRAVENOUS | Status: DC
  Administered 2022-01-26: 2 g via INTRAVENOUS
  Filled 2022-01-26 (×2): qty 20

## 2022-01-26 MED ORDER — ONDANSETRON HCL 4 MG PO TABS: 4.0000 mg | ORAL_TABLET | Freq: Four times a day (QID) | ORAL | Status: DC | PRN

## 2022-01-26 MED ORDER — ONDANSETRON HCL 4 MG/2ML IJ SOLN: 4.0000 mg | Freq: Four times a day (QID) | INTRAMUSCULAR | Status: DC | PRN

## 2022-01-26 MED ORDER — SODIUM CHLORIDE 0.9 % IV BOLUS (SEPSIS)
1000.0000 mL | Freq: Once | INTRAVENOUS | Status: AC
  Administered 2022-01-26: 1000 mL via INTRAVENOUS

## 2022-01-26 MED ORDER — ACETAMINOPHEN 325 MG PO TABS: 650.0000 mg | ORAL_TABLET | Freq: Four times a day (QID) | ORAL | Status: DC | PRN

## 2022-01-26 MED ORDER — HEPARIN SODIUM (PORCINE) 5000 UNIT/ML IJ SOLN: 4000.0000 [IU] | Freq: Once | INTRAMUSCULAR | Status: DC

## 2022-01-26 MED ORDER — ACETAMINOPHEN 650 MG RE SUPP: 650.0000 mg | Freq: Four times a day (QID) | RECTAL | Status: DC | PRN

## 2022-01-26 MED ORDER — SODIUM CHLORIDE 0.9% FLUSH
3.0000 mL | Freq: Two times a day (BID) | INTRAVENOUS | Status: DC
  Administered 2022-01-27 – 2022-01-28 (×3): 3 mL via INTRAVENOUS

## 2022-01-26 MED ORDER — EZETIMIBE 10 MG PO TABS
10.0000 mg | ORAL_TABLET | Freq: Every day | ORAL | Status: DC
  Administered 2022-01-27 – 2022-02-01 (×6): 10 mg via ORAL
  Filled 2022-01-26 (×6): qty 1

## 2022-01-26 MED ORDER — HEPARIN (PORCINE) 25000 UT/250ML-% IV SOLN
1100.0000 [IU]/h | INTRAVENOUS | Status: DC
  Filled 2022-01-26: qty 250

## 2022-01-26 MED ORDER — INSULIN GLARGINE-YFGN 100 UNIT/ML ~~LOC~~ SOLN
15.0000 [IU] | Freq: Every day | SUBCUTANEOUS | Status: DC
  Filled 2022-01-26 (×2): qty 0.15

## 2022-01-26 MED ORDER — ASPIRIN 81 MG PO CHEW
81.0000 mg | CHEWABLE_TABLET | Freq: Every day | ORAL | Status: DC
  Administered 2022-01-27 – 2022-01-28 (×2): 81 mg via ORAL
  Filled 2022-01-26 (×2): qty 1

## 2022-01-26 MED ORDER — SODIUM CHLORIDE 0.9 % IV BOLUS
1000.0000 mL | Freq: Once | INTRAVENOUS | Status: AC
  Administered 2022-01-26: 1000 mL via INTRAVENOUS

## 2022-01-26 MED ORDER — POLYETHYLENE GLYCOL 3350 17 G PO PACK
17.0000 g | PACK | Freq: Every day | ORAL | Status: DC | PRN
  Administered 2022-01-29: 17 g via ORAL
  Filled 2022-01-26: qty 1

## 2022-01-26 MED ORDER — ESCITALOPRAM OXALATE 10 MG PO TABS
20.0000 mg | ORAL_TABLET | Freq: Every day | ORAL | Status: DC
  Administered 2022-01-27: 20 mg via ORAL
  Filled 2022-01-26: qty 2

## 2022-01-26 NOTE — Progress Notes (Incomplete)
Established Patient Office Visit  Subjective:  Patient ID: Erika [name], female    DOB: 01/21/1971  Age: 51 y.o. MRN: [id number]  CC: No chief complaint on file.   HPI [name] is here for hospital follow up.   At the ER, noted to be febrile with temp 102.63F, tachypneic and tachycardic. Hypotension. O2 sat 89% RA. Given O2 at 2 L, CBC with leukocytosis, BMP with mild hyponatremia and hyperglycemia. UA normal. Lactic acid negative. Covid 19 positive. CXR neg. Tx with rocepthin and zpack.   [id number]: treated inpt tx with paxlovid, vt C zinc  Generalized weakness/ambulatory dysfunction- PT and OT recommend SNF rehab  Hospital: [facility] Admit: 12/21/21, possible AMS Discharge:12/24/21 Discharge dx: d/c to SNF Discharge medications: see notes  Pt with h/o multiple coronary arter disease status post bypass grafting in 2017/carotids/also with carotid endarterectomy. Disastolic dysfunction, prior CVA with left sided hemiparesis of her arm and partial paresis of her leg. Type II diabetic.   11/21/21: admitted to hospital for CAP, left foot avulsion fracture as refesult of fall- f/u with orthopedist, h/o CVA with chronic left upper extremity paresis- continue on ASA, statins, 12/1 MRI no acute abn, depression/PTSD pt followed by psychiatry Dr. Dossie Der  Acute concerns:    Past Medical History:  Diagnosis Date   Arthralgia of temporomandibular joint    CAD, multiple vessel    a. 06/2016 Cath: ostLM 40%, ostLAD 40%, pLAD 95%, ost-pLCx 60%, pLCx 95%, mLCx 60%, mRCA 95%, D2 50%, LVSF nl;  b. 07/2016 CABG x 4 (LIMA->LAD, VG->Diag, VG->OM, VG->RCA); c. 08/2016 Cath: 3VD w/ 4/4 patent grafts. LAD distal to LIMA has diff dzs->Med rx; d. 08/2020 Cath: 4/4 patent grafts, native 3VD. EF 55-65%-->Med Rx.   Carotid arterial disease ([place])    a. 07/2016 s/p R CEA; b. 02/2021 U/S: RICA 22-02%, LICA 5-42%.   Clotting disorder ([place])    Depression    Diastolic dysfunction    a. 06/2016  Echo: EF 50-55%, mild inf wall HK, GR1DD, mild MR, RV sys fxn nl, mildly dilated LA, PASP nl   Fatty liver disease, nonalcoholic 7062   History of blood transfusion    with heart surgery   HLD (hyperlipidemia)    Labile hypertension    a. prior renal ngiogram negative for RAS in 03/2016; b. catecholamines and metanephrines normal, mildly elevated renin with normal aldosterone and normal ratio in 02/2016   Myocardial infarction [facility]) 2017   Obesity    PAD (peripheral artery disease) ([name])    a. 09/2018 s/p L SFA stenting; b. 07/2019 Periph Angio: Patent m/d L SFA stent w/ 100% L SFA distal to stent. L AT 100d, L Peroneal diff dzs-->Med Rx; c. 02/2021 ABIs: stable @ 0.61 on R and 0.46 on L.   PTSD (post-traumatic stress disorder)    Tobacco abuse    Type 2 diabetes mellitus ([place]) 12/2015    Past Surgical History:  Procedure Laterality Date   ABDOMINAL AORTOGRAM W/LOWER EXTREMITY [name] 10/15/2018   Procedure: ABDOMINAL AORTOGRAM W/LOWER EXTREMITY;  Surgeon: Wellington Hampshire, MD;  Location: [place] [facility];  Service: Cardiovascular;  Laterality: [name];   ABDOMINAL AORTOGRAM W/LOWER EXTREMITY Bilateral 08/19/2019   Procedure: ABDOMINAL AORTOGRAM W/LOWER EXTREMITY;  Surgeon: Wellington Hampshire, MD;  Location: [place] [facility];  Service: Cardiovascular;  Laterality: Bilateral;   CARDIAC CATHETERIZATION [name] 06/29/2016   Procedure: Left Heart Cath and Coronary Angiography;  Surgeon: Minna Merritts, MD;  Location: [place] [facility];  Service: Cardiovascular;  Laterality: [name];   CARDIAC CATHETERIZATION [name] 08/29/2016   Procedure: Left Heart Cath and Cors/Grafts Angiography;  Surgeon: Wellington Hampshire, MD;  Location: [place] [facility];  Service: Cardiovascular;  Laterality: [name];   CESAREAN SECTION     CHOLECYSTECTOMY     CORONARY ARTERY BYPASS GRAFT [name] 07/06/2016   Procedure: CORONARY ARTERY BYPASS GRAFTING (CABG) x four, using left internal mammary artery and right leg greater  saphenous vein harvested endoscopically;  Surgeon: Ivin Poot, MD;  Location: [place];  Service: Open Heart Surgery;  Laterality: [name];   ENDARTERECTOMY Right 07/06/2016   Procedure: ENDARTERECTOMY CAROTID;  Surgeon: Rosetta Posner, MD;  Location: [facility] OR;  Service: Vascular;  Laterality: Right;   ENDARTERECTOMY Right 04/27/2020   Procedure: REDO OF RIGHT ENDARTERECTOMY CAROTID;  Surgeon: Rosetta Posner, MD;  Location: [facility];  Service: Vascular;  Laterality: Right;   LEFT HEART CATH AND CORS/GRAFTS ANGIOGRAPHY [name] 08/24/2020   Procedure: LEFT HEART CATH AND CORS/GRAFTS ANGIOGRAPHY;  Surgeon: Wellington Hampshire, MD;  Location: [place] [facility];  Service: Cardiovascular;  Laterality: [name];   PERIPHERAL VASCULAR CATHETERIZATION [name] 04/18/2016   Procedure: Renal Angiography;  Surgeon: Wellington Hampshire, MD;  Location: [place] [facility];  Service: Cardiovascular;  Laterality: [name];   PERIPHERAL VASCULAR INTERVENTION Left 10/15/2018   Procedure: PERIPHERAL VASCULAR INTERVENTION;  Surgeon: Wellington Hampshire, MD;  Location: [place] [facility];  Service: Cardiovascular;  Laterality: Left;  Left superficial femoral   TEE WITHOUT CARDIOVERSION [name] 07/06/2016   Procedure: TRANSESOPHAGEAL ECHOCARDIOGRAM (TEE);  Surgeon: Ivin Poot, MD;  Location: [place];  Service: Open Heart Surgery;  Laterality: [name];   TONSILLECTOMY      Family History  Adopted: Yes  Problem Relation Age of Onset   Diabetes Mother    Diabetes Father    Alcohol abuse Father    Heart disease Father    Drug abuse Father    Stroke Sister    Anxiety disorder Sister     Social History   Socioeconomic History   Marital status: Married    Spouse name: Not on file   Number of children: 1   Years of education: 14   Highest education level: Not on file  Occupational History   Occupation: Disability  Tobacco Use   Smoking status: Former    Packs/day: 0.50    Years: 27.00    Pack years: 13.50    Types: Cigarettes    Smokeless tobacco: Never  Scientific laboratory technician Use: Every day   Substances: Flavoring  Substance and Sexual Activity   Alcohol use: Not Currently    Alcohol/week: 0.0 standard drinks    Comment: socially   Drug use: No   Sexual activity: Yes    Partners: Male    Birth control/protection: None  Other Topics Concern   Not on file  Social History Narrative   11/21/20   From: [name]   Living: with husband, [name] (2009) and special needs daughter    Work: disability       Family: daughter - [name] (special needs, lives with her) and 2 grown step children - [name] and [name]       Enjoys: play with dogs - 2 german Shepard, mut, and blue tick walker mix      Exercise: PAD limits exercise   Diet: diabetic diet and low potassium due to daughter      Safety   Seat belts: Yes  Guns: Yes  and secure   Safe in relationships: Yes    Social Determinants of Health   Financial Resource Strain: Not on file  Food Insecurity: Not on file  Transportation Needs: Not on file  Physical Activity: Not on file  Stress: Not on file  Social Connections: Not on file  Intimate Partner Violence: Not on file    Outpatient Medications Prior to Visit  Medication Sig Dispense Refill   acetaminophen (TYLENOL) 325 MG tablet Take 1-2 tablets (325-650 mg total) by mouth every 4 (four) hours as needed for mild pain. (Patient taking differently: Take 650 mg by mouth every 4 (four) hours as needed for mild pain.)     albuterol (VENTOLIN HFA) 108 (90 Base) MCG/ACT inhaler Inhale 2 puffs into the lungs every 6 (six) hours as needed for wheezing. 18 g 0   ascorbic acid (VITAMIN C) 500 MG tablet Take 1 tablet (500 mg total) by mouth daily. 30 tablet 2   aspirin 81 MG chewable tablet Chew 1 tablet (81 mg total) by mouth daily with breakfast. 120 tablet 1   baclofen (LIORESAL) 10 MG tablet Take 1 tablet (10 mg total) by mouth at bedtime. 30 each 0   benztropine (COGENTIN) 0.5 MG tablet  Take 1 tablet (0.5 mg total) by mouth at bedtime. 3 tablet 0   carvedilol (COREG) 6.25 MG tablet Take 1 tablet (6.25 mg total) by mouth 2 (two) times daily. 180 tablet 1   chlorproMAZINE (THORAZINE) 100 MG tablet TAKE 1 TABLET BY MOUTH EVERYDAY AT BEDTIME 3 tablet 0   clonazePAM (KLONOPIN) 0.5 MG tablet Take 1 tablet (0.5 mg total) by mouth 2 (two) times daily. 10 tablet 0   clopidogrel (PLAVIX) 75 MG tablet TAKE ONE TABLET BY MOUTH ONE TIME DAILY 30 tablet 2   cyanocobalamin (,VITAMIN B-12,) 1000 MCG/ML injection Inject 1 mL (1,000 mcg total) into the skin every 30 (thirty) days. 1 mL 0   cyanocobalamin 1000 MCG tablet Take 1,000 mcg by mouth daily.     cyclobenzaprine (FLEXERIL) 10 MG tablet TAKE ONE TABLET BY MOUTH AT BEDTIME 30 tablet 0   dexamethasone (DECADRON) 6 MG tablet Take 1 tablet (6 mg total) by mouth daily. 5 tablet 0   diclofenac Sodium (VOLTAREN) 1 % GEL Apply 2 g topically 4 (four) times daily.     Dulaglutide (TRULICITY) 1.68 HF/2.9MS SOPN Inject 0.75 mg into the skin once a week. 6 mL 3   escitalopram (LEXAPRO) 20 MG tablet Take 1 tablet (20 mg total) by mouth daily. 34 tablet 0   Evolocumab (REPATHA SURECLICK) 111 MG/ML SOAJ Inject 1 Dose into the skin every 14 (fourteen) days. 6 mL 3   ezetimibe (ZETIA) 10 MG tablet TAKE ONE TABLET BY MOUTH ONE TIME DAILY 30 tablet 2   guaiFENesin (MUCINEX) 600 MG 12 hr tablet Take 1 tablet (600 mg total) by mouth 2 (two) times daily. 60 tablet 0   HYDROcodone-acetaminophen (NORCO/VICODIN) 5-325 MG tablet Take 1 tablet by mouth every 6 (six) hours as needed for severe pain or moderate pain. 10 tablet 0   insulin detemir (LEVEMIR FLEXTOUCH) 100 UNIT/ML FlexPen Inject 20 Units into the skin at bedtime. 15 mL 3   Insulin lispro (HUMALOG JUNIOR KWIKPEN) 100 UNIT/ML Inject 0-10 Units into the skin 3 (three) times daily. insulin Lispro (Humalog) injection 0-10 Units 0-10 Units Subcutaneous, 3 times daily with meals CBG < 70: Implement  Hypoglycemia Standing Orders and refer to Hypoglycemia Standing Orders sidebar report  CBG 70 -  120: 0 unit CBG 121 - 150: 0 unit  CBG 151 - 200: 1 unit CBG 201 - 250: 2 units CBG 251 - 300: 4 units CBG 301 - 350: 6 units  CBG 351 - 400: 8 units  CBG > 400: 10 units 3 mL 3   isosorbide mononitrate (IMDUR) 60 MG 24 hr tablet TAKE ONE TABLET BY MOUTH TWICE A DAY 60 tablet 2   lamoTRIgine (LAMICTAL) 200 MG tablet Take 1 tablet (200 mg total) by mouth at bedtime. 3 tablet 0   nitroGLYCERIN (NITROSTAT) 0.4 MG SL tablet Place 1 tablet (0.4 mg total) under the tongue every 5 (five) minutes as needed for chest pain. 25 tablet 1   omega-3 acid ethyl esters (LOVAZA) 1 g capsule Take 1 g by mouth 2 (two) times daily.     pantoprazole (PROTONIX) 40 MG tablet TAKE ONE TABLET BY MOUTH ONE TIME DAILY 30 tablet 0   polyethylene glycol powder (GLYCOLAX/MIRALAX) 17 GM/SCOOP powder MIX 1 CAPFUL IN 8 OUNCES OF FLUID BY MOUTH TWICE A DAY 238 g 0   potassium chloride (KLOR-CON) 20 MEQ packet Take 20 mEq by mouth daily. (Patient not taking: Reported on 12/22/2021) 30 packet 0   pregabalin (LYRICA) 225 MG capsule Take 1 capsule (225 mg total) by mouth 2 (two) times daily. 10 capsule 0   rosuvastatin (CRESTOR) 20 MG tablet Take 1 tablet (20 mg total) by mouth daily. 30 tablet 0   senna-docusate (SENOKOT-S) 8.6-50 MG tablet Take 2 tablets by mouth at bedtime. 120 tablet 0   spironolactone (ALDACTONE) 25 MG tablet TAKE ONE TABLET BY MOUTH ONE TIME DAILY 30 tablet 0   torsemide (DEMADEX) 10 MG tablet Take 1 tablet (10 mg total) by mouth daily. PLEASE SCHEDULE OFFICE VISIT FOR FURTHER REFILLS. THANK YOU! 30 tablet 0   traZODone (DESYREL) 50 MG tablet Take 0.5 tablets (25 mg total) by mouth at bedtime as needed for sleep.     vitamin B-12 1000 MCG tablet Take 1 tablet (1,000 mcg total) by mouth daily. (Patient not taking: Reported on 12/22/2021)     zinc sulfate 220 (50 Zn) MG capsule Take 1 capsule (220 mg total)  by mouth daily. 30 capsule 0   No facility-administered medications prior to visit.    Allergies  Allergen Reactions   Chantix [Varenicline Tartrate] Other (See Comments)    Feels "crazy" and angry     ROS Review of Systems  Review of Systems  Respiratory:  Negative for shortness of breath.   Cardiovascular:  Negative for chest pain and palpitations.  Gastrointestinal:  Negative for constipation and diarrhea.  Genitourinary:  Negative for dysuria, frequency and urgency.  Musculoskeletal:  Negative for myalgias.  Psychiatric/Behavioral:  Negative for depression and suicidal ideas.   All other systems reviewed and are negative.    Objective:    Physical Exam  Gen: NAD, resting comfortably CV: RRR with no murmurs appreciated Pulm: NWOB, CTAB with no crackles, wheezes, or rhonchi Skin: warm, dry Psych: Normal affect and thought content  There were no vitals taken for this visit. Wt Readings from Last 3 Encounters:  12/22/21 208 lb 15.9 oz (94.8 kg)  11/27/21 218 lb 0.6 oz (98.9 kg)  11/21/21 218 lb 0.6 oz (98.9 kg)     Health Maintenance Due  Topic Date Due   COVID-19 Vaccine (1) Never done   URINE MICROALBUMIN  08/15/2018   COLONOSCOPY (Pts 45-9yr Insurance coverage will need to be confirmed)  Never done   TETANUS/TDAP  07/26/2020   OPHTHALMOLOGY EXAM  10/19/2020   PAP SMEAR-Modifier  02/25/2021   INFLUENZA VACCINE  07/24/2021    There are no preventive care reminders to display for this patient.  Lab Results  Component Value Date   TSH 1.950 06/28/2021   Lab Results  Component Value Date   WBC 10.8 (H) 12/24/2021   HGB 13.5 12/24/2021   HCT 42.7 12/24/2021   MCV 85.2 12/24/2021   PLT 289 12/24/2021   Lab Results  Component Value Date   NA 134 (L) 12/21/2021   K 3.6 12/21/2021   CO2 29 12/21/2021   GLUCOSE 134 (H) 12/21/2021   BUN 15 12/21/2021   CREATININE 0.96 12/21/2021   BILITOT 0.1 (L) 12/21/2021   ALKPHOS 61 12/21/2021   AST  31 12/21/2021   ALT 28 12/21/2021   PROT 7.9 12/21/2021   ALBUMIN 4.2 12/21/2021   CALCIUM 9.7 12/21/2021   ANIONGAP 9 12/21/2021   EGFR 83 09/11/2021   GFR 105.97 11/21/2020   Lab Results  Component Value Date   CHOL 202 (H) 06/25/2021   Lab Results  Component Value Date   HDL 29 (L) 06/25/2021   Lab Results  Component Value Date   LDLCALC UNABLE TO CALCULATE IF TRIGLYCERIDE OVER 400 mg/dL 06/25/2021   Lab Results  Component Value Date   TRIG 636 (H) 06/25/2021   Lab Results  Component Value Date   CHOLHDL 7.0 06/25/2021   Lab Results  Component Value Date   HGBA1C 7.0 (H) 11/21/2021      Assessment & Plan:   Problem List Items Addressed This Visit   None   No orders of the defined types were placed in this encounter.   Follow-up: No follow-ups on file.    Eugenia Pancoast, FNP

## 2022-01-26 NOTE — Progress Notes (Signed)
01/26/22 1350  Clinical Encounter Type  Visited With Patient and family together  Visit Type Initial;Psychological support;Spiritual support  Referral From Other (Comment)  Spiritual Encounters  Spiritual Needs Emotional (social)   Erika [name] responded to code stroke. Attended to Pt and spouse, Erika [name], following CT scan. Chaplain B met this family in July during past stroke event. Chaplain Burris encouraged self-care for [name] and he did go out to get some food. Chaplain B checked-in on feelings of Pt since all she has gone through over the past several months. Chaplain provided hospitality (blanket, socks) and non-anxious, compassionate presence. Pt is drowsy so left her to rest for now. Will continue to follow.

## 2022-01-26 NOTE — ED Notes (Signed)
Request made for transport to the floor ?

## 2022-01-26 NOTE — ED Notes (Addendum)
Per Dr. Dione Plover at bedside, 1L bolus of LR hung for low BP.

## 2022-01-26 NOTE — ED Notes (Addendum)
[facility] 0800 Dr. Quinn Axe to ER awaiting pt arrival at 1226; pt arrival 1246 BGL on arrival 105 CODE STROKE called to [facility] at 1226  Pt c/o increased L sided weakness x a few days; HA & slurred speech today. Husband called EMS at 30. VS for EMS: 97/46, 67 HR, 89% on RA, 92% on 3L [place]  Pt to CT scan at 1250 IVF bolus started at 1252 per Dr. Quinn Axe 1256: No TNK per Dr. Quinn Axe Pt to room 9 at 49 & Dr. Tamala Julian at bedside VS at 1321: 90/42, 62, 98.2 oral, 16R, 97% on 2L [place]

## 2022-01-26 NOTE — Progress Notes (Signed)
ANTICOAGULATION CONSULT NOTE - Initial Consult  Pharmacy Consult for Heparin drip Indication: chest pain/ACS  Allergies  Allergen Reactions   Chantix [Varenicline Tartrate] Other (See Comments)    Feels "crazy" and angry     Patient Measurements: Height: 5\' 8"  (172.7 cm) Weight: 102.5 kg (226 lb) IBW/kg (Calculated) : 63.9 Heparin Dosing Weight: 86.7 kg  Vital Signs: Temp: 97.5 F (36.4 C) (02/03 2041) Temp Source: Oral (02/03 1944) BP: 123/66 (02/03 2041) Pulse Rate: 67 (02/03 2041)  Labs: Recent Labs    01/26/22 1349 01/26/22 1733 01/26/22 2111  HGB 11.8*  --   --   HCT 38.0  --   --   PLT 295  --   --   APTT 35  --   --   LABPROT 15.0  --   --   INR 1.2  --   --   CREATININE 0.86  --   --   TROPONINIHS 2,535* 2,613* 2,196*     Estimated Creatinine Clearance: 100.1 mL/min (by C-G formula based on SCr of 0.86 mg/dL).   Medical History: Past Medical History:  Diagnosis Date   Arthralgia of temporomandibular joint    CAD, multiple vessel    a. 06/2016 Cath: ostLM 40%, ostLAD 40%, pLAD 95%, ost-pLCx 60%, pLCx 95%, mLCx 60%, mRCA 95%, D2 50%, LVSF nl;  b. 07/2016 CABG x 4 (LIMA->LAD, VG->Diag, VG->OM, VG->RCA); c. 08/2016 Cath: 3VD w/ 4/4 patent grafts. LAD distal to LIMA has diff dzs->Med rx; d. 08/2020 Cath: 4/4 patent grafts, native 3VD. EF 55-65%-->Med Rx.   Carotid arterial disease ([place])    a. 07/2016 s/p R CEA; b. 02/2021 U/S: RICA 25-05%, LICA 3-97%.   Clotting disorder ([place])    Depression    Diastolic dysfunction    a. 06/2016 Echo: EF 50-55%, mild inf wall HK, GR1DD, mild MR, RV sys fxn nl, mildly dilated LA, PASP nl   Fatty liver disease, nonalcoholic 6734   History of blood transfusion    with heart surgery   HLD (hyperlipidemia)    Labile hypertension    a. prior renal ngiogram negative for RAS in 03/2016; b. catecholamines and metanephrines normal, mildly elevated renin with normal aldosterone and normal ratio in 02/2016   Myocardial infarction [facility])  2017   Obesity    PAD (peripheral artery disease) ([place])    a. 09/2018 s/p L SFA stenting; b. 07/2019 Periph Angio: Patent m/d L SFA stent w/ 100% L SFA distal to stent. L AT 100d, L Peroneal diff dzs-->Med Rx; c. 02/2021 ABIs: stable @ 0.61 on R and 0.46 on L.   PTSD (post-traumatic stress disorder)    Tobacco abuse    Type 2 diabetes mellitus ([place]) 12/2015    Medications:  Scheduled:   [START ON 01/27/2022] aspirin  81 mg Oral Q breakfast   benztropine  0.5 mg Oral QHS   chlorproMAZINE  100 mg Oral QHS   [START ON 01/27/2022] clopidogrel  75 mg Oral Daily   [START ON 01/27/2022] escitalopram  20 mg Oral Daily   [START ON 01/27/2022] ezetimibe  10 mg Oral Daily   [START ON 01/27/2022] FLUoxetine  60 mg Oral Daily   [START ON 01/27/2022] heparin  2,000 Units Intravenous Once   icosapent Ethyl  2 g Oral BID   [START ON 01/27/2022] insulin aspart  0-15 Units Subcutaneous TID WC   insulin glargine-yfgn  15 Units Subcutaneous QHS   lamoTRIgine  200 mg Oral QHS   pantoprazole (PROTONIX) IV  40 mg  Intravenous Q24H   pregabalin  225 mg Oral BID   [START ON 01/27/2022] rosuvastatin  20 mg Oral Daily   sodium chloride flush  3 mL Intravenous Q12H   Infusions:   azithromycin 500 mg (01/26/22 1610)   cefTRIAXone (ROCEPHIN)  IV Stopped (01/26/22 1517)   [START ON 01/27/2022] heparin     lactated ringers 150 mL/hr at 01/26/22 1943    Assessment: 51 yo F to start Heparin drip for ACS/STEMI. Gb 11.8  plt 295  INR 1.2  aPTT 35 On ASA/plavix PTA per Med Rec.  -  Heparin drip and bolus were ordered on 2/3 around 1730 but not started due to concern for possible CVA. - Neuro ruled out CVA and MD ok'd to start heparin.  - MD requests using smaller heparin bolus of 2000 units (unsure as to why)    Goal of Therapy:  Heparin level 0.3-0.7 units/ml Monitor platelets by anticoagulation protocol: Yes   Plan:  Will order Heparin 2000 units IV X 1 bolus and begin heparin drip @ 1100 units/hr. Will check HL 6 hrs  after start of drip.  Shloimy Michalski D 01/26/2022,11:11 PM

## 2022-01-26 NOTE — Plan of Care (Signed)
MRI brain showed no e/o acute infarct. No further inpatient neurologic workup indicated. BP goal normotension, avoid hypotension. Cardiology wishes to start anticoagulation 2/2 c/f ACS; neurology is fine with that. Neurology to sign off, but please re-engage if additional neurologic concerns arise.  Su Monks, MD [facility] [phone]  If 7pm- 7am, please page neurology on call as listed in [place].

## 2022-01-26 NOTE — Progress Notes (Signed)
CODE SEPSIS - PHARMACY COMMUNICATION  **Broad Spectrum Antibiotics should be administered within 1 hour of Sepsis diagnosis**  Time Code Sepsis Called/Page Received: 1441  Antibiotics Ordered: CTX, azith  Time of 1st antibiotic administration: 9937  Additional action taken by pharmacy:    If necessary, Name of Provider/Nurse Contacted:      Noralee Space ,PharmD Clinical Pharmacist  01/26/2022  3:40 PM

## 2022-01-26 NOTE — Plan of Care (Addendum)
Pt was alert upon admission and oriented x 4. After hs meds given pt more drowsy. Pt was able to move her toes on her left foot. Left arm remains flaccid from previous stroke in July. This am pt drowsy but responds. Spoke with pt regarding meds causing increased drowsiness pt awoke and stated "It's the thorazine" then went back to sleep.  Problem: Activity: Goal: Ability to tolerate increased activity will improve Outcome: Progressing   Problem: Clinical Measurements: Goal: Ability to maintain a body temperature in the normal range will improve Outcome: Progressing   Problem: Respiratory: Goal: Ability to maintain adequate ventilation will improve Outcome: Progressing Goal: Ability to maintain a clear airway will improve Outcome: Progressing

## 2022-01-26 NOTE — ED Notes (Signed)
Phlebotomist at [facility] for bloodwork.

## 2022-01-26 NOTE — ED Notes (Signed)
Informed RN bed assigned 

## 2022-01-26 NOTE — ED Notes (Signed)
Phlebotomist at [name] for labs.

## 2022-01-26 NOTE — ED Notes (Signed)
Pt placed on bedpan. Call light given to pt for when she is ready to have the bedpan removed. This RN offered the purewick, but pt states she does not like that.

## 2022-01-26 NOTE — Progress Notes (Signed)
ANTICOAGULATION CONSULT NOTE - Initial Consult  Pharmacy Consult for Heparin drip Indication: chest pain/ACS  Allergies  Allergen Reactions   Chantix [Varenicline Tartrate] Other (See Comments)    Feels "crazy" and angry     Patient Measurements: Height: 5\' 8"  (172.7 cm) Weight: 102.5 kg (226 lb) IBW/kg (Calculated) : 63.9 Heparin Dosing Weight: 86.7 kg  Vital Signs: Temp: 98.2 F (36.8 C) (02/03 1327) Temp Source: Oral (02/03 1327) BP: 121/75 (02/03 1710) Pulse Rate: 65 (02/03 1710)  Labs: Recent Labs    01/26/22 1349  HGB 11.8*  HCT 38.0  PLT 295  APTT 35  LABPROT 15.0  INR 1.2  CREATININE 0.86  TROPONINIHS 2,535*    Estimated Creatinine Clearance: 100.1 mL/min (by C-G formula based on SCr of 0.86 mg/dL).   Medical History: Past Medical History:  Diagnosis Date   Arthralgia of temporomandibular joint    CAD, multiple vessel    a. 06/2016 Cath: ostLM 40%, ostLAD 40%, pLAD 95%, ost-pLCx 60%, pLCx 95%, mLCx 60%, mRCA 95%, D2 50%, LVSF nl;  b. 07/2016 CABG x 4 (LIMA->LAD, VG->Diag, VG->OM, VG->RCA); c. 08/2016 Cath: 3VD w/ 4/4 patent grafts. LAD distal to LIMA has diff dzs->Med rx; d. 08/2020 Cath: 4/4 patent grafts, native 3VD. EF 55-65%-->Med Rx.   Carotid arterial disease ([place])    a. 07/2016 s/p R CEA; b. 02/2021 U/S: RICA 27-78%, LICA 2-42%.   Clotting disorder ([place])    Depression    Diastolic dysfunction    a. 06/2016 Echo: EF 50-55%, mild inf wall HK, GR1DD, mild MR, RV sys fxn nl, mildly dilated LA, PASP nl   Fatty liver disease, nonalcoholic 3536   History of blood transfusion    with heart surgery   HLD (hyperlipidemia)    Labile hypertension    a. prior renal ngiogram negative for RAS in 03/2016; b. catecholamines and metanephrines normal, mildly elevated renin with normal aldosterone and normal ratio in 02/2016   Myocardial infarction [facility]) 2017   Obesity    PAD (peripheral artery disease) ([place])    a. 09/2018 s/p L SFA stenting; b. 07/2019 Periph Angio:  Patent m/d L SFA stent w/ 100% L SFA distal to stent. L AT 100d, L Peroneal diff dzs-->Med Rx; c. 02/2021 ABIs: stable @ 0.61 on R and 0.46 on L.   PTSD (post-traumatic stress disorder)    Tobacco abuse    Type 2 diabetes mellitus ([place]) 12/2015    Medications:  Scheduled:   aspirin  324 mg Oral Once   [START ON 01/27/2022] aspirin  81 mg Oral Q breakfast   benztropine  0.5 mg Oral QHS   chlorproMAZINE  100 mg Oral QHS   clopidogrel  75 mg Oral Daily   escitalopram  20 mg Oral Daily   ezetimibe  10 mg Oral Daily   heparin  4,000 Units Intravenous Once   [START ON 01/27/2022] insulin aspart  0-15 Units Subcutaneous TID WC   insulin glargine-yfgn  15 Units Subcutaneous QHS   lamoTRIgine  200 mg Oral QHS   pantoprazole (PROTONIX) IV  40 mg Intravenous Q24H   pregabalin  225 mg Oral BID   rosuvastatin  20 mg Oral Daily   sodium chloride flush  3 mL Intravenous Q12H   Infusions:   azithromycin 500 mg (01/26/22 1610)   cefTRIAXone (ROCEPHIN)  IV Stopped (01/26/22 1517)   heparin     lactated ringers      Assessment: 51 yo F to start Heparin drip for ACS/STEMI. Gb 11.8  plt 295  INR 1.2  aPTT 35 On ASA/plavix PTA per Med Rec.   Goal of Therapy:  Heparin level 0.3-0.7 units/ml Monitor platelets by anticoagulation protocol: Yes   Plan:  Give 4000 units bolus x 1 Start heparin infusion at 1100 units/hr Check anti-Xa level in 6 hours and daily while on heparin Continue to monitor H&H and platelets  Kasidi Shanker A 01/26/2022,5:29 PM

## 2022-01-26 NOTE — ED Triage Notes (Signed)
Pt BIB EMS from home for stroke sx's (slurred speech, HA, increased L sided weakness).

## 2022-01-26 NOTE — ED Provider Notes (Signed)
[facility] Provider Note    Event Date/Time   First MD Initiated Contact with Patient 01/26/22 1248     (approximate)   History   Code Stroke   HPI  Erika [name] is a 51 y.o. female who presents to the ED for evaluation of Code Stroke   Review recent DC summary from 1/1.  She was admitted for COVID-19 and SIRS criteria.  She has a history of CAD s/p CABG HTN, HLD, DM, obesity and PAD.  DAPT with Plavix without anticoagulation.  Patient presents to the ED via EMS from home for evaluation of possible stroke with increasing left-sided weakness just today.  77 of history is provided by the husband at the bedside as patient is undergoing a stroke work-up.  He reports that she was fine last night and this morning when he left for work at 8 AM.  She reportedly called out for assistance earlier this morning and he spoke with her on the phone, she seemed weak and dysarthric at that time.  Patient reports that she does not know what happened and reports feeling weak overall. She reports about 2 days of increasing shortness of breath and productive cough.  Denies chest pain.  Physical Exam   Triage Vital Signs: ED Triage Vitals  Enc Vitals Group     BP      Pulse      Resp      Temp      Temp src      SpO2      Weight      Height      Head Circumference      Peak Flow      Pain Score      Pain Loc      Pain Edu?      Excl. in [place]?     Most recent vital signs: Vitals:   01/26/22 1340 01/26/22 1430  BP: (!) 106/55 103/63  Pulse: 70 64  Resp:  (!) 21  Temp:    SpO2: 99% 99%    General: No distress.  Sleepy on my initial evaluation, improving after IV fluids and seems to perk up some on reassessment. CV:  Good peripheral perfusion. RRR Resp:  Normal effort.  Minimal tachypnea to the low 20s.  No distress.  Requiring nasal cannula due to mild hypoxia Abd:  No distention.  MSK:  No deformity noted.  Neuro:  Left-sided weakness is  present, uncertain if progressive from her stroke last year and chronic left-sided deficits that are known.  Cranial nerves are intact , sensation intact and right-sided strength intact Other:     ED Results / Procedures / Treatments   Labs (all labs ordered are listed, but only abnormal results are displayed) Labs Reviewed  CBC - Abnormal; Notable for the following components:      Result Value   WBC 18.1 (*)    Hemoglobin 11.8 (*)    RDW 15.9 (*)    All other components within normal limits  DIFFERENTIAL - Abnormal; Notable for the following components:   Neutro Abs 14.6 (*)    Abs Immature Granulocytes 0.08 (*)    All other components within normal limits  COMPREHENSIVE METABOLIC PANEL - Abnormal; Notable for the following components:   Glucose, Bld 107 (*)    Albumin 3.4 (*)    All other components within normal limits  TROPONIN I (HIGH SENSITIVITY) - Abnormal; Notable for the following components:   Troponin  I (High Sensitivity) 2,535 (*)    All other components within normal limits  RESP PANEL BY RT-PCR (FLU A&B, COVID) ARPGX2  CULTURE, BLOOD (ROUTINE X 2)  CULTURE, BLOOD (ROUTINE X 2)  PROTIME-INR  APTT  LACTIC ACID, PLASMA  PROCALCITONIN  LACTIC ACID, PLASMA  URINALYSIS, ROUTINE W REFLEX MICROSCOPIC  CBG MONITORING, ED  I-STAT CREATININE, ED  POC URINE PREG, ED  TROPONIN I (HIGH SENSITIVITY)    EKG Sinus rhythm, rate of 65 bpm.  Normal axis and intervals.  Nonspecific ST changes laterally and inferiorly with slight ST depressions without STEMI.  RADIOLOGY 1 view CXR reviewed by me with left basilar opacities. CT head reviewed by me without evidence of [place]   Official radiology report(s): DG Chest Portable 1 View  Result Date: 01/26/2022 CLINICAL DATA:  hypoxic, eval infiltrate EXAM: PORTABLE CHEST 1 VIEW COMPARISON:  CTA from the same day. Chest radiograph November 21, 2021. FINDINGS: Low lung volumes. Left basilar opacities. No visible pleural effusions or  pneumothorax. Enlarged cardiac silhouette, accentuated by low lung volumes and AP technique. CABG and median sternotomy. Superior most two median sternotomy wires are fractured. IMPRESSION: 1. Left basilar opacities, concerning for pneumonia. 2. Cardiomegaly. Electronically Signed   By: Margaretha Sheffield M.D.   On: 01/26/2022 13:54   CT HEAD CODE STROKE WO CONTRAST  Result Date: 01/26/2022 CLINICAL DATA:  Code stroke.  Left arm weakness and facial droop EXAM: CT HEAD WITHOUT CONTRAST TECHNIQUE: Contiguous axial images were obtained from the base of the skull through the vertex without intravenous contrast. RADIATION DOSE REDUCTION: This exam was performed according to the departmental dose-optimization program which includes automated exposure control, adjustment of the mA and/or kV according to patient size and/or use of iterative reconstruction technique. COMPARISON:  Brain MRI 11/23/2021, CT head 09/08/2021 FINDINGS: Brain: There is no evidence of acute intracranial hemorrhage, extra-axial fluid collection, or acute infarct. Background parenchymal volume is normal. The ventricles are normal in size. Remote infarcts in the right parietal and occipital lobes are again noted with hyperdensity overlying the right occipital infarct consistent with cortical laminar necrosis. A remote infarct in the right pons is also again seen. There is no mass lesion.  There is no midline shift. Vascular: No dense vessel is seen. There is calcification of the bilateral cavernous ICAs. Skull: Normal. Negative for fracture or focal lesion. Sinuses/Orbits: Imaged paranasal sinuses are clear. The globes and orbits are unremarkable. Other: None. ASPECTS [facility] Stroke Program Early CT Score) - Ganglionic level infarction (caudate, lentiform nuclei, internal capsule, insula, M1-M3 cortex): 7 - Supraganglionic infarction (M4-M6 cortex): 3 Total score (0-10 with 10 being normal): 10 IMPRESSION: 1. No acute intracranial hemorrhage or  infarct. 2. ASPECTS is 10 3. Remote infarcts as above. These results were called by telephone at the time of interpretation on 01/26/2022 at 1:02 pm to provider Dr Quinn Axe, who verbally acknowledged these results. Electronically Signed   By: Valetta Mole M.D.   On: 01/26/2022 13:05   CT ANGIO HEAD NECK W WO CM W PERF (CODE STROKE)  Result Date: 01/26/2022 CLINICAL DATA:  Left facial droop and arm weakness EXAM: CT ANGIOGRAPHY HEAD AND NECK CT PERFUSION BRAIN TECHNIQUE: Multidetector CT imaging of the head and neck was performed using the standard protocol during bolus administration of intravenous contrast. Multiplanar CT image reconstructions and MIPs were obtained to evaluate the vascular anatomy. Carotid stenosis measurements (when applicable) are obtained utilizing NASCET criteria, using the distal internal carotid diameter as the denominator. Multiphase CT imaging  of the brain was performed following IV bolus contrast injection. Subsequent parametric perfusion maps were calculated using RAPID software. RADIATION DOSE REDUCTION: This exam was performed according to the departmental dose-optimization program which includes automated exposure control, adjustment of the mA and/or kV according to patient size and/or use of iterative reconstruction technique. CONTRAST:  185mL OMNIPAQUE IOHEXOL 350 MG/ML SOLN COMPARISON:  None. Same-day noncontrast head CT, brain MRI 11/23/2021, CTA head/neck 06/24/2021 FINDINGS: CTA NECK FINDINGS Aortic arch: There is calcified atherosclerotic plaque of the aortic arch. The origins of the major branch vessels are patent. Subclavian arteries are patent. There is mixed plaque at the origin of the right brachiocephalic artery without hemodynamically significant stenosis. Right carotid system: There is soft plaque throughout the right common carotid artery resulting in up to approximately 30% stenosis. There is primarily soft plaque in the right carotid bulb resulting in up to  approximally 20% stenosis. The distal right internal carotid artery is patent. The right external carotid artery is occluded at its origin for a short-segment with distal reconstitution, unchanged. There is no dissection or aneurysm. Left carotid system: There is scattered soft and calcified plaque in the left common carotid artery without hemodynamically significant stenosis or occlusion. There is mixed plaque in the proximal left common carotid artery resulting in up to approximately 30% stenosis. The distal left internal carotid artery is patent. The left external carotid artery is patent. There is no evidence of dissection or aneurysm. Vertebral arteries: The right vertebral artery is patent in the neck. The left vertebral artery is occluded from its origin through the mid V2 segment, with reconstitution of flow at the C3-C4 level. This is unchanged compared to the prior study from 06/24/2021. The remainder of the left vertebral artery is patent in the neck. Skeleton: There is no acute osseous abnormality or aggressive osseous lesion. There is no visible canal hematoma. Other neck: Soft tissues are unremarkable. Upper chest: There is patchy ground-glass opacity in the superior segment of the left lower lobe, incompletely imaged. Review of the MIP images confirms the above findings CTA HEAD FINDINGS Anterior circulation: There is calcified atherosclerotic plaque in the bilateral intracranial ICAs resulting in up to moderate stenosis bilaterally. The bilateral MCAs are patent with mild atherosclerotic irregularity. There is no proximal high-grade stenosis or occlusion. The bilateral ACAs are patent with mild atherosclerotic irregularity. There is no proximal high-grade stenosis or occlusion. There is no aneurysm or AVM. Posterior circulation: The right V4 segment is patent but with multifocal atherosclerotic irregularity resulting in up to mild-to-moderate stenosis (7-239). PICA is identified on the right. There  is mixed plaque in the proximal left V4 segment resulting in moderate to severe stenosis. The left vertebral artery is not identified beyond the PICA origin, unchanged. There is multifocal irregularity and narrowing of the basilar artery with up to moderate stenosis proximally (8-108). The basilar artery remains patent to its tip. The superior cerebellar arteries are patent. There is a fetal origin of the bilateral PCAs. There is irregularity of the bilateral P1 segments resulting in up to mild-to-moderate stenosis on the left (8-90). Otherwise, the PCAs are patent with mild multifocal irregularity and narrowing. There is no aneurysm or AVM. Venous sinuses: Patent. Anatomic variants: None. Review of the MIP images confirms the above findings CT Brain Perfusion Findings: ASPECTS: 10 CBF (<30%) Volume: 62mL Perfusion (Tmax>6.0s) volume: 57mL Mismatch Volume: 80mL Infarction Location:No infarct identified. The area of ischemia is in the left anterior temporal lobe. IMPRESSION: 1. No infarct core identified.  5 cc of ischemia was identified in the left anterior temporal lobe which may be artifactual. Recommend correlation with MRI. 2. No emergent large vessel occlusion. 3. Atherosclerotic plaque in the bilateral carotid systems resulting in up to 30% stenosis in the right common carotid artery and 20% stenosis in the right internal carotid artery, and 30% stenosis of the left internal carotid artery. Findings are not significantly changed. 4. 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 There is significant atherosclerotic disease in the intracranial left V4 segment resulting in up to moderate to severe stenosis, and the V4 segment is not identified beyond the PICA origin, unchanged. The right vertebral artery is patent with up to mild-to-moderate stenosis. 5. Moderate stenosis of the bilateral intracranial ICAs and proximal basilar artery. No proximal high-grade stenosis  or occlusion in the intracranial vasculature. 6. Unchanged occlusion of the right proximal external carotid artery with distal reconstitution. 7. Patchy ground-glass opacity in the left lower lobe may be infectious or inflammatory in nature, incompletely evaluated. Consider dedicated imaging of the chest as indicated. These results were paged to Dr. Quinn Axe via [employer] at 1:43 p.m. Electronically Signed   By: Valetta Mole M.D.   On: 01/26/2022 13:46    PROCEDURES and INTERVENTIONS:  .1-3 Lead EKG Interpretation Performed by: Vladimir Crofts, MD Authorized by: Vladimir Crofts, MD     Interpretation: normal     ECG rate:  64   ECG rate assessment: normal     Rhythm: sinus rhythm     Ectopy: none     Conduction: normal   .Critical Care Performed by: Vladimir Crofts, MD Authorized by: Vladimir Crofts, MD   Critical care provider statement:    Critical care time (minutes):  30   Critical care time was exclusive of:  Separately billable procedures and treating other patients   Critical care was necessary to treat or prevent imminent or life-threatening deterioration of the following conditions:  CNS failure or compromise and sepsis   Critical care was time spent personally by me on the following activities:  Development of treatment plan with patient or surrogate, discussions with consultants, evaluation of patient's response to treatment, examination of patient, ordering and review of laboratory studies, ordering and review of radiographic studies, ordering and performing treatments and interventions, pulse oximetry, re-evaluation of patient's condition and review of old charts  Medications  lactated ringers infusion (has no administration in time range)  sodium chloride 0.9 % bolus 1,000 mL (1,000 mLs Intravenous New Bag/Given 01/26/22 1451)  cefTRIAXone (ROCEPHIN) 2 g in sodium chloride 0.9 % 100 mL IVPB (2 g Intravenous New Bag/Given 01/26/22 1447)  azithromycin (ZITHROMAX) 500 mg in sodium chloride 0.9 % 250  mL IVPB (has no administration in time range)  heparin injection 4,000 Units (has no administration in time range)  aspirin chewable tablet 324 mg (has no administration in time range)  sodium chloride flush (NS) 0.9 % injection 3 mL (3 mLs Intravenous Given 01/26/22 1335)  iohexol (OMNIPAQUE) 350 MG/ML injection 100 mL (100 mLs Intravenous Contrast Given 01/26/22 1307)  sodium chloride 0.9 % bolus 1,000 mL (1,000 mLs Intravenous New Bag/Given 01/26/22 1433)     IMPRESSION / MDM / [place] / ED COURSE  I reviewed the triage vital signs and the nursing notes.  51 year old female presents to the ED as a code stroke, with neuro symptoms improving with fluid resuscitation, likely representing recrudescence of old stroke in the setting of hypovolemia in  the setting of sepsis from pneumonia.  She has soft pressures on arrival, improving with IV fluids and without evidence of septic shock.  Mild hypoxia with saturations of 88-89%, rectified with 2 L nasal cannula.  Her left-sided weakness improved with fluid resuscitation and seems to return to about her baseline from a previous stroke last year.  Blood work is most notable for leukocytosis to 18k and significant increase in troponin to 2500.  He denies chest pain and EKG without STEMI, only has nonspecific changes laterally and inferiorly.  We will provide aspirin and initiate heparin drip for NSTEMI.  My suspicion is that her stroke symptoms and this NSTEMI are due to hypovolemia in the setting of hypoperfusion from sepsis from pneumonia.  We will start on CAP antibiotics per protocol and discussed with hospitalist for admission.  Clinical Course as of 01/26/22 1521  Fri Jan 26, 2022  1327 I discuss with Dr. Quinn Axe [DS]  1440 Reassessed.  Improved. [DS]    Clinical Course User Index [DS] Vladimir Crofts, MD     FINAL CLINICAL IMPRESSION(S) / ED DIAGNOSES   Final diagnoses:  NSTEMI (non-ST elevated myocardial infarction) ([place])  Sepsis with  acute hypoxic respiratory failure without septic shock, due to unspecified organism [facility])     Rx / DC Orders   ED Discharge Orders     None        Note:  This document was prepared using Dragon voice recognition software and may include unintentional dictation errors.   Vladimir Crofts, MD 01/26/22 [phone]

## 2022-01-26 NOTE — Sepsis Progress Note (Signed)
Code Sepsis protocol being monitored by [facility] 

## 2022-01-26 NOTE — ED Notes (Signed)
This RN called & spoke with lab to request a phlebotomist come draw the repeat troponin. They will send someone.

## 2022-01-26 NOTE — ED Notes (Signed)
Pt transported to MRI via stretcher with MRI tech.

## 2022-01-26 NOTE — H&P (Signed)
[facility] History and Physical  Erika [name]:[id number] DOB: 1971/10/14 DOA: 01/26/2022  Referring physician: Dr. Joni Fears PCP: Lesleigh Noe, MD   Chief Complaint: possible stroke  HPI: Erika [name] is a 51 y.o. female with hx of severe vasculopathy with multiple complications including CAD status post CABG x4, CVA, carotid stenosis, renal artery stenosis, as well as diastolic CHF, depression/anxiety, chronic opiate and benzodiazepine use, essential hypertension, diabetes, hyperlipidemia, and chronic pain, who presented with concern for stroke.  Chart reviewed, patient with multiple recent admissions over the past 2 months.  Last admitted December 29 January 1, had hypoxic respiratory failure due to COVID-19 at that time.  Prior to that admitted from November 29 to December 16 for pneumonia, foot fracture, and subsequently rehab.  On review of chart patient presented today via EMS as a code stroke for increasing left-sided weakness.  On my exam patient is arousable and responds appropriately to questions but immediately falls back asleep.  84 of history is obtained from her husband Erika [name] who is at bedside.  He reports he is a former EMT, and is very medically savvy.  He reports that he has not noticed anything out of the ordinary over the past few days or he would have brought her in for an evaluation.  When he left this morning for work everything seemed fine, he was then called by his daughter few hours later and told that she was not acting normally.  He got on the phone with her and she was slurring her words so he came home immediately.  On arrival home he noticed worsening left-sided weakness which made him concerned about a possible stroke, he also put a pulse ox on her that he has at home and her oxygen saturation was 83%.  He immediately called EMS to bring her to the hospital to be evaluated.  In the ED initial vital signs were largely unremarkable.  CBC  notable for elevated white count of 18 and very mild anemia of 11.8, CMP was unremarkable.  Coags were normal.  Troponin was 2535.  Lactic acid was normal at 1.7, procalcitonin was elevated at 0.62, chest x-ray showed cardiomegaly as well as left lower lobe opacities concerning for infection.  Neurology was consulted and CTA head and neck was obtained which did not show any acute findings.  Neurology felt this was either recrudescence in the setting of her infection and hypotension or new acute infarct potentially related to hypotension.  They recommended admission and MRI.  Review of Systems:  Pertinent positives and negative per HPI, all others reviewed and negative  Past Medical History:  Diagnosis Date   Arthralgia of temporomandibular joint    CAD, multiple vessel    a. 06/2016 Cath: ostLM 40%, ostLAD 40%, pLAD 95%, ost-pLCx 60%, pLCx 95%, mLCx 60%, mRCA 95%, D2 50%, LVSF nl;  b. 07/2016 CABG x 4 (LIMA->LAD, VG->Diag, VG->OM, VG->RCA); c. 08/2016 Cath: 3VD w/ 4/4 patent grafts. LAD distal to LIMA has diff dzs->Med rx; d. 08/2020 Cath: 4/4 patent grafts, native 3VD. EF 55-65%-->Med Rx.   Carotid arterial disease ([place])    a. 07/2016 s/p R CEA; b. 02/2021 U/S: RICA 95-62%, LICA 1-30%.   Clotting disorder ([place])    Depression    Diastolic dysfunction    a. 06/2016 Echo: EF 50-55%, mild inf wall HK, GR1DD, mild MR, RV sys fxn nl, mildly dilated LA, PASP nl   Fatty liver disease, nonalcoholic 8657   History of blood transfusion  with heart surgery   HLD (hyperlipidemia)    Labile hypertension    a. prior renal ngiogram negative for RAS in 03/2016; b. catecholamines and metanephrines normal, mildly elevated renin with normal aldosterone and normal ratio in 02/2016   Myocardial infarction [facility]) 2017   Obesity    PAD (peripheral artery disease) ([place])    a. 09/2018 s/p L SFA stenting; b. 07/2019 Periph Angio: Patent m/d L SFA stent w/ 100% L SFA distal to stent. L AT 100d, L Peroneal diff dzs-->Med Rx; c.  02/2021 ABIs: stable @ 0.61 on R and 0.46 on L.   PTSD (post-traumatic stress disorder)    Tobacco abuse    Type 2 diabetes mellitus ([place]) 12/2015   Past Surgical History:  Procedure Laterality Date   ABDOMINAL AORTOGRAM W/LOWER EXTREMITY [name] 10/15/2018   Procedure: ABDOMINAL AORTOGRAM W/LOWER EXTREMITY;  Surgeon: Wellington Hampshire, MD;  Location: [place] [facility];  Service: Cardiovascular;  Laterality: [name];   ABDOMINAL AORTOGRAM W/LOWER EXTREMITY Bilateral 08/19/2019   Procedure: ABDOMINAL AORTOGRAM W/LOWER EXTREMITY;  Surgeon: Wellington Hampshire, MD;  Location: [place] [facility];  Service: Cardiovascular;  Laterality: Bilateral;   CARDIAC CATHETERIZATION [name] 06/29/2016   Procedure: Left Heart Cath and Coronary Angiography;  Surgeon: Minna Merritts, MD;  Location: [place] [facility];  Service: Cardiovascular;  Laterality: [name];   CARDIAC CATHETERIZATION [name] 08/29/2016   Procedure: Left Heart Cath and Cors/Grafts Angiography;  Surgeon: Wellington Hampshire, MD;  Location: [place] [facility];  Service: Cardiovascular;  Laterality: [name];   CESAREAN SECTION     CHOLECYSTECTOMY     CORONARY ARTERY BYPASS GRAFT [name] 07/06/2016   Procedure: CORONARY ARTERY BYPASS GRAFTING (CABG) x four, using left internal mammary artery and right leg greater saphenous vein harvested endoscopically;  Surgeon: Ivin Poot, MD;  Location: [place];  Service: Open Heart Surgery;  Laterality: [name];   ENDARTERECTOMY Right 07/06/2016   Procedure: ENDARTERECTOMY CAROTID;  Surgeon: Rosetta Posner, MD;  Location: [facility] OR;  Service: Vascular;  Laterality: Right;   ENDARTERECTOMY Right 04/27/2020   Procedure: REDO OF RIGHT ENDARTERECTOMY CAROTID;  Surgeon: Rosetta Posner, MD;  Location: [facility] OR;  Service: Vascular;  Laterality: Right;   LEFT HEART CATH AND CORS/GRAFTS ANGIOGRAPHY [name] 08/24/2020   Procedure: LEFT HEART CATH AND CORS/GRAFTS ANGIOGRAPHY;  Surgeon: Wellington Hampshire, MD;  Location: [place] [facility];  Service: Cardiovascular;   Laterality: [name];   PERIPHERAL VASCULAR CATHETERIZATION [name] 04/18/2016   Procedure: Renal Angiography;  Surgeon: Wellington Hampshire, MD;  Location: [place] [facility];  Service: Cardiovascular;  Laterality: [name];   PERIPHERAL VASCULAR INTERVENTION Left 10/15/2018   Procedure: PERIPHERAL VASCULAR INTERVENTION;  Surgeon: Wellington Hampshire, MD;  Location: [place] [facility];  Service: Cardiovascular;  Laterality: Left;  Left superficial femoral   TEE WITHOUT CARDIOVERSION [name] 07/06/2016   Procedure: TRANSESOPHAGEAL ECHOCARDIOGRAM (TEE);  Surgeon: Ivin Poot, MD;  Location: [place];  Service: Open Heart Surgery;  Laterality: [name];   TONSILLECTOMY     Social History:  reports that she has quit smoking. Her smoking use included cigarettes. She has a 13.50 pack-year smoking history. She has never used smokeless tobacco. She reports that she does not currently use alcohol. She reports that she does not use drugs.  Allergies  Allergen Reactions   Chantix [Varenicline Tartrate] Other (See Comments)    Feels "crazy" and angry     Family History  Adopted: Yes  Problem Relation Age of Onset  Diabetes Mother    Diabetes Father    Alcohol abuse Father    Heart disease Father    Drug abuse Father    Stroke Sister    Anxiety disorder Sister      Prior to Admission medications   Medication Sig Start Date End Date Taking? Authorizing Provider  acetaminophen (TYLENOL) 325 MG tablet Take 1-2 tablets (325-650 mg total) by mouth every 4 (four) hours as needed for mild pain. Patient taking differently: Take 650 mg by mouth every 4 (four) hours as needed for mild pain. 08/01/21   Love, Ivan Anchors, PA-C  albuterol (VENTOLIN HFA) 108 (90 Base) MCG/ACT inhaler Inhale 2 puffs into the lungs every 6 (six) hours as needed for wheezing. 12/24/21 12/24/22  Roxan Hockey, MD  ascorbic acid (VITAMIN C) 500 MG tablet Take 1 tablet (500 mg total) by mouth daily. 12/25/21   Roxan Hockey, MD  aspirin 81 MG chewable tablet  Chew 1 tablet (81 mg total) by mouth daily with breakfast. 12/24/21   Roxan Hockey, MD  baclofen (LIORESAL) 10 MG tablet Take 1 tablet (10 mg total) by mouth at bedtime. 12/08/21   Angiulli, Lavon Paganini, PA-C  benztropine (COGENTIN) 0.5 MG tablet Take 1 tablet (0.5 mg total) by mouth at bedtime. 12/08/21 12/08/22  Angiulli, Lavon Paganini, PA-C  carvedilol (COREG) 6.25 MG tablet Take 1 tablet (6.25 mg total) by mouth 2 (two) times daily. 08/22/21   Wellington Hampshire, MD  chlorproMAZINE (THORAZINE) 100 MG tablet TAKE 1 TABLET BY MOUTH EVERYDAY AT BEDTIME 12/08/21   Angiulli, Lavon Paganini, PA-C  clonazePAM (KLONOPIN) 0.5 MG tablet Take 1 tablet (0.5 mg total) by mouth 2 (two) times daily. 12/24/21   Roxan Hockey, MD  clopidogrel (PLAVIX) 75 MG tablet TAKE ONE TABLET BY MOUTH ONE TIME DAILY 01/10/22   Wellington Hampshire, MD  cyanocobalamin (,VITAMIN B-12,) 1000 MCG/ML injection Inject 1 mL (1,000 mcg total) into the skin every 30 (thirty) days. 08/29/21   Love, Ivan Anchors, PA-C  cyanocobalamin 1000 MCG tablet Take 1,000 mcg by mouth daily.    [provider]  cyclobenzaprine (FLEXERIL) 10 MG tablet TAKE ONE TABLET BY MOUTH AT BEDTIME 01/25/22   Raulkar, Clide Deutscher, MD  dexamethasone (DECADRON) 6 MG tablet Take 1 tablet (6 mg total) by mouth daily. 12/24/21   Roxan Hockey, MD  diclofenac Sodium (VOLTAREN) 1 % GEL Apply 2 g topically 4 (four) times daily. 12/08/21   Angiulli, Lavon Paganini, PA-C  Dulaglutide (TRULICITY) 7.61 PJ/0.9TO SOPN Inject 0.75 mg into the skin once a week. 03/21/21   Renato Shin, MD  escitalopram (LEXAPRO) 20 MG tablet Take 1 tablet (20 mg total) by mouth daily. 01/10/22 01/10/23  Arfeen, Arlyce Harman, MD  Evolocumab (REPATHA SURECLICK) 671 MG/ML SOAJ Inject 1 Dose into the skin every 14 (fourteen) days. 09/07/21   Hilty, Nadean Corwin, MD  ezetimibe (ZETIA) 10 MG tablet TAKE ONE TABLET BY MOUTH ONE TIME DAILY 01/10/22   Wellington Hampshire, MD  guaiFENesin (MUCINEX) 600 MG 12 hr tablet Take 1 tablet (600 mg  total) by mouth 2 (two) times daily. 12/24/21   Roxan Hockey, MD  HYDROcodone-acetaminophen (NORCO/VICODIN) 5-325 MG tablet Take 1 tablet by mouth every 6 (six) hours as needed for severe pain or moderate pain. 12/24/21   Roxan Hockey, MD  insulin detemir (LEVEMIR FLEXTOUCH) 100 UNIT/ML FlexPen Inject 20 Units into the skin at bedtime. 12/24/21   Roxan Hockey, MD  Insulin lispro (HUMALOG JUNIOR KWIKPEN) 100 UNIT/ML Inject 0-10 Units  into the skin 3 (three) times daily. insulin Lispro (Humalog) injection 0-10 Units 0-10 Units Subcutaneous, 3 times daily with meals CBG < 70: Implement Hypoglycemia Standing Orders and refer to Hypoglycemia Standing Orders sidebar report  CBG 70 - 120: 0 unit CBG 121 - 150: 0 unit  CBG 151 - 200: 1 unit CBG 201 - 250: 2 units CBG 251 - 300: 4 units CBG 301 - 350: 6 units  CBG 351 - 400: 8 units  CBG > 400: 10 units 12/24/21   Emokpae, Courage, MD  isosorbide mononitrate (IMDUR) 60 MG 24 hr tablet TAKE ONE TABLET BY MOUTH TWICE A DAY 09/25/21   Wellington Hampshire, MD  lamoTRIgine (LAMICTAL) 200 MG tablet Take 1 tablet (200 mg total) by mouth at bedtime. 12/08/21   Angiulli, Lavon Paganini, PA-C  nitroGLYCERIN (NITROSTAT) 0.4 MG SL tablet Place 1 tablet (0.4 mg total) under the tongue every 5 (five) minutes as needed for chest pain. 03/15/21   Wellington Hampshire, MD  omega-3 acid ethyl esters (LOVAZA) 1 g capsule Take 1 g by mouth 2 (two) times daily.    [provider]  pantoprazole (PROTONIX) 40 MG tablet TAKE ONE TABLET BY MOUTH ONE TIME DAILY 01/10/22   Raulkar, Clide Deutscher, MD  polyethylene glycol powder (GLYCOLAX/MIRALAX) 17 GM/SCOOP powder MIX 1 CAPFUL IN 8 OUNCES OF FLUID BY MOUTH TWICE A DAY 10/17/21   Raulkar, Clide Deutscher, MD  potassium chloride (KLOR-CON) 20 MEQ packet Take 20 mEq by mouth daily. Patient not taking: Reported on 12/22/2021 12/08/21   Angiulli, Lavon Paganini, PA-C  pregabalin (LYRICA) 225 MG capsule Take 1 capsule (225 mg total) by mouth 2 (two) times daily.  12/08/21   Angiulli, Lavon Paganini, PA-C  rosuvastatin (CRESTOR) 20 MG tablet Take 1 tablet (20 mg total) by mouth daily. 08/01/21   Love, Ivan Anchors, PA-C  senna-docusate (SENOKOT-S) 8.6-50 MG tablet Take 2 tablets by mouth at bedtime. 12/24/21   Roxan Hockey, MD  spironolactone (ALDACTONE) 25 MG tablet TAKE ONE TABLET BY MOUTH ONE TIME DAILY 08/25/21   Raulkar, Clide Deutscher, MD  torsemide (DEMADEX) 10 MG tablet Take 1 tablet (10 mg total) by mouth daily. PLEASE SCHEDULE OFFICE VISIT FOR FURTHER REFILLS. THANK YOU! 10/09/21 12/22/21  Wellington Hampshire, MD  traZODone (DESYREL) 50 MG tablet Take 0.5 tablets (25 mg total) by mouth at bedtime as needed for sleep. 12/08/21   Angiulli, Lavon Paganini, PA-C  vitamin B-12 1000 MCG tablet Take 1 tablet (1,000 mcg total) by mouth daily. Patient not taking: Reported on 12/22/2021 06/29/21   Enzo Bi, MD  zinc sulfate 220 (50 Zn) MG capsule Take 1 capsule (220 mg total) by mouth daily. 12/25/21   Roxan Hockey, MD   Physical Exam: Vitals:   01/26/22 1330 01/26/22 1340 01/26/22 1430 01/26/22 1500  BP: 107/64 (!) 106/55 103/63 114/75  Pulse: 62 70 64 64  Resp: 16  (!) 21 15  Temp:      TempSrc:      SpO2: 96% 99% 99% 98%  Weight:      Height:        Wt Readings from Last 3 Encounters:  01/26/22 102.5 kg  12/22/21 94.8 kg  11/27/21 98.9 kg     General: Ill appearing Eyes: normal lids, irises & conjunctiva ENT: grossly normal hearing, lips & tongue Neck: no masses Cardiovascular: RRR, no m/r/g. No LE edema. Telemetry: SR, no arrhythmias  Respiratory: CTA bilaterally but limited anterior exam. Mildly increased respiratory effort. Abdomen:  soft, ntnd Skin: no rash or induration seen on limited exam Neurologic: arousable and responds appropriately to questions but quickly falls back asleep, unable to assess neuro exam to any significant degree          Labs on Admission:  Basic Metabolic Panel: Recent Labs  Lab 01/26/22 1349  NA 138  K 3.6  CL 99  CO2  29  GLUCOSE 107*  BUN 16  CREATININE 0.86  CALCIUM 9.4   Liver Function Tests: Recent Labs  Lab 01/26/22 1349  AST 30  ALT 16  ALKPHOS 52  BILITOT 0.5  PROT 7.1  ALBUMIN 3.4*   No results for input(s): LIPASE, AMYLASE in the last 168 hours. No results for input(s): AMMONIA in the last 168 hours. CBC: Recent Labs  Lab 01/26/22 1349  WBC 18.1*  NEUTROABS 14.6*  HGB 11.8*  HCT 38.0  MCV 89.8  PLT 295   Cardiac Enzymes: No results for input(s): CKTOTAL, CKMB, CKMBINDEX, TROPONINI in the last 168 hours.  BNP (last 3 results) Recent Labs    06/24/21 0540  BNP 33.3    ProBNP (last 3 results) No results for input(s): PROBNP in the last 8760 hours.  CBG: No results for input(s): GLUCAP in the last 168 hours.  Radiological Exams on Admission: DG Chest Portable 1 View  Result Date: 01/26/2022 CLINICAL DATA:  hypoxic, eval infiltrate EXAM: PORTABLE CHEST 1 VIEW COMPARISON:  CTA from the same day. Chest radiograph November 21, 2021. FINDINGS: Low lung volumes. Left basilar opacities. No visible pleural effusions or pneumothorax. Enlarged cardiac silhouette, accentuated by low lung volumes and AP technique. CABG and median sternotomy. Superior most two median sternotomy wires are fractured. IMPRESSION: 1. Left basilar opacities, concerning for pneumonia. 2. Cardiomegaly. Electronically Signed   By: Margaretha Sheffield M.D.   On: 01/26/2022 13:54   CT HEAD CODE STROKE WO CONTRAST  Result Date: 01/26/2022 CLINICAL DATA:  Code stroke.  Left arm weakness and facial droop EXAM: CT HEAD WITHOUT CONTRAST TECHNIQUE: Contiguous axial images were obtained from the base of the skull through the vertex without intravenous contrast. RADIATION DOSE REDUCTION: This exam was performed according to the departmental dose-optimization program which includes automated exposure control, adjustment of the mA and/or kV according to patient size and/or use of iterative reconstruction technique.  COMPARISON:  Brain MRI 11/23/2021, CT head 09/08/2021 FINDINGS: Brain: There is no evidence of acute intracranial hemorrhage, extra-axial fluid collection, or acute infarct. Background parenchymal volume is normal. The ventricles are normal in size. Remote infarcts in the right parietal and occipital lobes are again noted with hyperdensity overlying the right occipital infarct consistent with cortical laminar necrosis. A remote infarct in the right pons is also again seen. There is no mass lesion.  There is no midline shift. Vascular: No dense vessel is seen. There is calcification of the bilateral cavernous ICAs. Skull: Normal. Negative for fracture or focal lesion. Sinuses/Orbits: Imaged paranasal sinuses are clear. The globes and orbits are unremarkable. Other: None. ASPECTS [facility] Stroke Program Early CT Score) - Ganglionic level infarction (caudate, lentiform nuclei, internal capsule, insula, M1-M3 cortex): 7 - Supraganglionic infarction (M4-M6 cortex): 3 Total score (0-10 with 10 being normal): 10 IMPRESSION: 1. No acute intracranial hemorrhage or infarct. 2. ASPECTS is 10 3. Remote infarcts as above. These results were called by telephone at the time of interpretation on 01/26/2022 at 1:02 pm to provider Dr Quinn Axe, who verbally acknowledged these results. Electronically Signed   By: Court Joy.D.  On: 01/26/2022 13:05   CT ANGIO HEAD NECK W WO CM W PERF (CODE STROKE)  Result Date: 01/26/2022 CLINICAL DATA:  Left facial droop and arm weakness EXAM: CT ANGIOGRAPHY HEAD AND NECK CT PERFUSION BRAIN TECHNIQUE: Multidetector CT imaging of the head and neck was performed using the standard protocol during bolus administration of intravenous contrast. Multiplanar CT image reconstructions and MIPs were obtained to evaluate the vascular anatomy. Carotid stenosis measurements (when applicable) are obtained utilizing NASCET criteria, using the distal internal carotid diameter as the denominator. Multiphase CT  imaging of the brain was performed following IV bolus contrast injection. Subsequent parametric perfusion maps were calculated using RAPID software. RADIATION DOSE REDUCTION: This exam was performed according to the departmental dose-optimization program which includes automated exposure control, adjustment of the mA and/or kV according to patient size and/or use of iterative reconstruction technique. CONTRAST:  164mL OMNIPAQUE IOHEXOL 350 MG/ML SOLN COMPARISON:  None. Same-day noncontrast head CT, brain MRI 11/23/2021, CTA head/neck 06/24/2021 FINDINGS: CTA NECK FINDINGS Aortic arch: There is calcified atherosclerotic plaque of the aortic arch. The origins of the major branch vessels are patent. Subclavian arteries are patent. There is mixed plaque at the origin of the right brachiocephalic artery without hemodynamically significant stenosis. Right carotid system: There is soft plaque throughout the right common carotid artery resulting in up to approximately 30% stenosis. There is primarily soft plaque in the right carotid bulb resulting in up to approximally 20% stenosis. The distal right internal carotid artery is patent. The right external carotid artery is occluded at its origin for a short-segment with distal reconstitution, unchanged. There is no dissection or aneurysm. Left carotid system: There is scattered soft and calcified plaque in the left common carotid artery without hemodynamically significant stenosis or occlusion. There is mixed plaque in the proximal left common carotid artery resulting in up to approximately 30% stenosis. The distal left internal carotid artery is patent. The left external carotid artery is patent. There is no evidence of dissection or aneurysm. Vertebral arteries: The right vertebral artery is patent in the neck. The left vertebral artery is occluded from its origin through the mid V2 segment, with reconstitution of flow at the C3-C4 level. This is unchanged compared to the  prior study from 06/24/2021. The remainder of the left vertebral artery is patent in the neck. Skeleton: There is no acute osseous abnormality or aggressive osseous lesion. There is no visible canal hematoma. Other neck: Soft tissues are unremarkable. Upper chest: There is patchy ground-glass opacity in the superior segment of the left lower lobe, incompletely imaged. Review of the MIP images confirms the above findings CTA HEAD FINDINGS Anterior circulation: There is calcified atherosclerotic plaque in the bilateral intracranial ICAs resulting in up to moderate stenosis bilaterally. The bilateral MCAs are patent with mild atherosclerotic irregularity. There is no proximal high-grade stenosis or occlusion. The bilateral ACAs are patent with mild atherosclerotic irregularity. There is no proximal high-grade stenosis or occlusion. There is no aneurysm or AVM. Posterior circulation: The right V4 segment is patent but with multifocal atherosclerotic irregularity resulting in up to mild-to-moderate stenosis (7-239). PICA is identified on the right. There is mixed plaque in the proximal left V4 segment resulting in moderate to severe stenosis. The left vertebral artery is not identified beyond the PICA origin, unchanged. There is multifocal irregularity and narrowing of the basilar artery with up to moderate stenosis proximally (8-108). The basilar artery remains patent to its tip. The superior cerebellar arteries are patent. There is a  fetal origin of the bilateral PCAs. There is irregularity of the bilateral P1 segments resulting in up to mild-to-moderate stenosis on the left (8-90). Otherwise, the PCAs are patent with mild multifocal irregularity and narrowing. There is no aneurysm or AVM. Venous sinuses: Patent. Anatomic variants: None. Review of the MIP images confirms the above findings CT Brain Perfusion Findings: ASPECTS: 10 CBF (<30%) Volume: 9mL Perfusion (Tmax>6.0s) volume: 68mL Mismatch Volume: 72mL Infarction  Location:No infarct identified. The area of ischemia is in the left anterior temporal lobe. IMPRESSION: 1. No infarct core identified. 5 cc of ischemia was identified in the left anterior temporal lobe which may be artifactual. Recommend correlation with MRI. 2. No emergent large vessel occlusion. 3. Atherosclerotic plaque in the bilateral carotid systems resulting in up to 30% stenosis in the right common carotid artery and 20% stenosis in the right internal carotid artery, and 30% stenosis of the left internal carotid artery. Findings are not significantly changed. 4. 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 There is significant atherosclerotic disease in the intracranial left V4 segment resulting in up to moderate to severe stenosis, and the V4 segment is not identified beyond the PICA origin, unchanged. The right vertebral artery is patent with up to mild-to-moderate stenosis. 5. Moderate stenosis of the bilateral intracranial ICAs and proximal basilar artery. No proximal high-grade stenosis or occlusion in the intracranial vasculature. 6. Unchanged occlusion of the right proximal external carotid artery with distal reconstitution. 7. Patchy ground-glass opacity in the left lower lobe may be infectious or inflammatory in nature, incompletely evaluated. Consider dedicated imaging of the chest as indicated. These results were paged to Dr. Quinn Axe via [employer] at 1:43 p.m. Electronically Signed   By: Valetta Mole M.D.   On: 01/26/2022 13:46    EKG: Independently reviewed.  Sinus rhythm, T wave inversions and submillimeter ST depressions in leads I, II, aVL, and V2 through V6.  Inverted T wave in V1 and aVR as well.  These changes are all new compared to prior on 21 December 2021.  Assessment/Plan Active Problems:   HLD (hyperlipidemia)   Cluster headache   MDD (major depressive disorder)   Renal artery stenosis (HCC)   Chest pain with high risk for cardiac  etiology   NSTEMI (non-ST elevated myocardial infarction) ([place])   Essential hypertension, malignant   Type 2 diabetes mellitus with other specified complication (HCC)   CAD in native artery   CAD (coronary artery disease)   Carotid stenosis   S/P CABG x 4   PVD (peripheral vascular disease) (HCC)   Chronic pain syndrome   Long term current use of opiate analgesic   Long term prescription benzodiazepine use   Chronic anticoagulation (PLAVIX)   Chronic diastolic CHF (congestive heart failure) (HCC)   Cerebrovascular accident (CVA) due to occlusion of vertebral artery (HCC)   Erika [name] is a 51 y.o. female with hx of severe vasculopathy with multiple complications including CAD status post CABG x4, CVA, carotid stenosis, renal artery stenosis, as well as diastolic CHF, depression/anxiety, chronic opiate and benzodiazepine use, essential hypertension, diabetes, hyperlipidemia, and chronic pain, who presented with concern for stroke and is found to have likely recurrent pneumonia as well as possible NSTEMI.  #Severe sepsis #HCAP Procalcitonin elevated 0.62, appears to have infiltrate in the left lower lobe as well.  Currently has minimal 2 L O2 oxygen requirement.  On multiple prior admissions we will treat as HCAP. - Ceftriaxone and azithromycin  per protocol - Trend procalcitonin - Check strep and Legionella urine antigens - Follow-up blood cultures  #NSTEMI #Elevated troponin #CAD #Cardiomegaly Discussed with Dr. Rockey Situ from cardiology.  Not totally clear if this is true NSTEMI though she is high risk given her history or if this is simply demand from her critical illness.  This would not however explain her cardiomegaly which is more concerning for possible new cardiomyopathy.  We will hold off on anticoagulation at present, I am awaiting to hear back from neurology if they feel it is appropriate to do now or if we should wait for the MRI. - Heparin per [facility] protocol pending  neurology recommendations - Give full dose aspirin - Telemetry - Formal echo - Follow-up formal cardiology recommendations - Repeat chest x-ray and EKG in the morning - Continue aspirin, clopidogrel, ezetimibe, icosapent ethyl, rosuvastatin.  Hold hypertension meds as listed below.  #Hx CVA #L sided weakness Patient still slurring speech on exam, per neuro unclear if recrudescence in setting of hypotension or new strokes.  CT imaging unremarkable.  Patient is ordered for MRI, appreciate neurology recommendations. - Follow-up MRI results - Every 4 hours neurochecks with stat CT head for any change in neuro exam - PT/OT/SLP consultation - Continue benztropine,  #Chronic medical problems Anxiety/depression-continue Thorazine, clonazepam, escitalopram, fluoxetine, Lamictal  DM2-reduce home insulin, give insulin glargine 15 units daily at bedtime and NovoLog 3 times daily sliding scale  Hypertension-given relative hypotension hold home carvedilol, Imdur, torsemide, spironolactone  GERD-continue PPI  Neuropathy-continue pregabalin  Code Status: Patient more alert and requested DNR/DNI status verbally in addition to husband stating she had expressed the same to him previously. DVT Prophylaxis: SCDs Family Communication: Husband Erika [name] updated at bedside Disposition Plan: Inpatient, stepdown  Time spent: 17 min  Clarnce Flock MD/[facility]  Note:  This document was prepared using Systems analyst and may include unintentional dictation errors.

## 2022-01-26 NOTE — ED Notes (Signed)
Pt back from MRI 

## 2022-01-26 NOTE — Consult Note (Signed)
Cardiology Consultation:   Patient ID: Erika [name] MRN: [id number]; DOB: [birth year]  Admit date: 01/26/2022 Date of Consult: 01/26/2022  PCP:  Lesleigh Noe, MD   [facility] Providers Cardiologist:  [name], MD  Physician requesting consult: Dr. Dione Plover Reason for consult elevated troponin, stroke  Patient Profile:   Erika [name] is a 51 y.o. female with a hx of CAD status post CABG, carotid arterial disease status post right carotid endarterectomy, peripheral arterial disease status post prior left SFA stenting with subsequent to distal SFA occlusion, HFpEF, hypertension, hyperlipidemia, obesity, diabetes, and tobacco abuse, who is being seen today for the evaluation of elevated troponin in the setting of acute stroke  Recent DC summary from 1/1.  She was admitted for COVID-19 and SIRS criteria  History of Present Illness:   Erika [name] reports having similar presentation in July 2022 Most of the history obtained by her husband at her bedside, as well as other family members Reports that she developed significant cough yesterday evening of no chest pain Late this morning 1030 to 11 AM, reported having left leg, left arm weakness, numbness, difficulty articulating, confusion Husband drove home, noted left facial droop and brought her to the emergency room Code stroke activated Head CT scan no acute findings  Typically runs high blood pressure, in the ER running low pressure documentations down to 90/40, saturations 89% room air up to 92% on 3 L  Troponin 2500, repeat 2600, repeat 2200 Heparin not initiated given MRI for stroke pending MRI brain completed, no acute infarct  Chest x-ray concerning for left basilar opacities, pneumonia  CT head neck Nonobstructive carotid disease No acute infarct Unchanged occlusion of the left vertebral artery from its origin through the distal V2 segment. There is significant atherosclerotic disease in the  intracranial left V4 segment resulting in up to moderate to severe stenosis, and the V4 segment is not identified beyond the PICA origin, unchanged. The right vertebral artery is patent with up to mild-to-moderate stenosis. 5. Moderate stenosis of the bilateral intracranial ICAs and proximal basilar artery. No proximal high-grade stenosis or occlusion in the intracranial vasculature. 6. Unchanged occlusion of the right proximal external carotid artery with distal reconstitution.   Other past cardiac history as below  She suffered a non-STEMI in 2017 and was found to have three-vessel coronary artery disease.  She subsequently underwent CABG x3 as well as right carotid endarterectomy.  In September 2017, she was hospitalized with chest pain in the setting of uncontrolled hypertension.  Cath showed 4 of 4 patent grafts.  In October 2019, she underwent lower extremity angiography in the setting of bilateral claudication and was found to have significant proximal left SFA stenosis followed by a short occlusion in the mid to distal segment and three-vessel runoff below the knee.  She underwent successful stenting of the mid to distal left SFA as well as drug-coated balloon angioplasty of the proximal left SFA.  Unfortunately, she had worsening left lower extremity claudication and in August 2020 underwent repeat angiography with finding of distal left SFA stenosis beyond the previously placed stent, as well as left anterior tibial stenosis, and diffuse disease in the left peroneal.  A walking program and medical therapy was recommended.  Her most recent diagnostic heart catheterization took place in September 2021 in the setting of worsening angina and again showed 4 4 patent grafts with severe native multivessel disease.  She has been medically managed since.  ABIs in March 2022 were stable with  an ABI of 0.61 on the right and 0.46 on the left.   On past clinic visits, she has continued to smoke and noted  ongoing lower extremity claudication as well as difficult to control hypertension.    Past Medical History:  Diagnosis Date   Arthralgia of temporomandibular joint    CAD, multiple vessel    a. 06/2016 Cath: ostLM 40%, ostLAD 40%, pLAD 95%, ost-pLCx 60%, pLCx 95%, mLCx 60%, mRCA 95%, D2 50%, LVSF nl;  b. 07/2016 CABG x 4 (LIMA->LAD, VG->Diag, VG->OM, VG->RCA); c. 08/2016 Cath: 3VD w/ 4/4 patent grafts. LAD distal to LIMA has diff dzs->Med rx; d. 08/2020 Cath: 4/4 patent grafts, native 3VD. EF 55-65%-->Med Rx.   Carotid arterial disease ([place])    a. 07/2016 s/p R CEA; b. 02/2021 U/S: RICA 88-41%, LICA 6-60%.   Clotting disorder ([place])    Depression    Diastolic dysfunction    a. 06/2016 Echo: EF 50-55%, mild inf wall HK, GR1DD, mild MR, RV sys fxn nl, mildly dilated LA, PASP nl   Fatty liver disease, nonalcoholic 6301   History of blood transfusion    with heart surgery   HLD (hyperlipidemia)    Labile hypertension    a. prior renal ngiogram negative for RAS in 03/2016; b. catecholamines and metanephrines normal, mildly elevated renin with normal aldosterone and normal ratio in 02/2016   Myocardial infarction [facility]) 2017   Obesity    PAD (peripheral artery disease) ([place])    a. 09/2018 s/p L SFA stenting; b. 07/2019 Periph Angio: Patent m/d L SFA stent w/ 100% L SFA distal to stent. L AT 100d, L Peroneal diff dzs-->Med Rx; c. 02/2021 ABIs: stable @ 0.61 on R and 0.46 on L.   PTSD (post-traumatic stress disorder)    Tobacco abuse    Type 2 diabetes mellitus ([place]) 12/2015    Past Surgical History:  Procedure Laterality Date   ABDOMINAL AORTOGRAM W/LOWER EXTREMITY [name] 10/15/2018   Procedure: ABDOMINAL AORTOGRAM W/LOWER EXTREMITY;  Surgeon: Wellington Hampshire, MD;  Location: [place] [facility];  Service: Cardiovascular;  Laterality: [name];   ABDOMINAL AORTOGRAM W/LOWER EXTREMITY Bilateral 08/19/2019   Procedure: ABDOMINAL AORTOGRAM W/LOWER EXTREMITY;  Surgeon: Wellington Hampshire, MD;  Location: [place]  [facility];  Service: Cardiovascular;  Laterality: Bilateral;   CARDIAC CATHETERIZATION [name] 06/29/2016   Procedure: Left Heart Cath and Coronary Angiography;  Surgeon: Minna Merritts, MD;  Location: [place] [facility];  Service: Cardiovascular;  Laterality: [name];   CARDIAC CATHETERIZATION [name] 08/29/2016   Procedure: Left Heart Cath and Cors/Grafts Angiography;  Surgeon: Wellington Hampshire, MD;  Location: [place] [facility];  Service: Cardiovascular;  Laterality: [name];   CESAREAN SECTION     CHOLECYSTECTOMY     CORONARY ARTERY BYPASS GRAFT [name] 07/06/2016   Procedure: CORONARY ARTERY BYPASS GRAFTING (CABG) x four, using left internal mammary artery and right leg greater saphenous vein harvested endoscopically;  Surgeon: Ivin Poot, MD;  Location: [place];  Service: Open Heart Surgery;  Laterality: [name];   ENDARTERECTOMY Right 07/06/2016   Procedure: ENDARTERECTOMY CAROTID;  Surgeon: Rosetta Posner, MD;  Location: [facility] OR;  Service: Vascular;  Laterality: Right;   ENDARTERECTOMY Right 04/27/2020   Procedure: REDO OF RIGHT ENDARTERECTOMY CAROTID;  Surgeon: Rosetta Posner, MD;  Location: [facility] OR;  Service: Vascular;  Laterality: Right;   LEFT HEART CATH AND CORS/GRAFTS ANGIOGRAPHY [name] 08/24/2020   Procedure: LEFT HEART CATH AND CORS/GRAFTS ANGIOGRAPHY;  Surgeon: Wellington Hampshire, MD;  Location: [place] [facility];  Service: Cardiovascular;  Laterality: [name];   PERIPHERAL VASCULAR CATHETERIZATION [name] 04/18/2016   Procedure: Renal Angiography;  Surgeon: Wellington Hampshire, MD;  Location: [place] [facility];  Service: Cardiovascular;  Laterality: [name];   PERIPHERAL VASCULAR INTERVENTION Left 10/15/2018   Procedure: PERIPHERAL VASCULAR INTERVENTION;  Surgeon: Wellington Hampshire, MD;  Location: [place] [facility];  Service: Cardiovascular;  Laterality: Left;  Left superficial femoral   TEE WITHOUT CARDIOVERSION [name] 07/06/2016   Procedure: TRANSESOPHAGEAL ECHOCARDIOGRAM (TEE);  Surgeon: Ivin Poot, MD;  Location: [place];   Service: Open Heart Surgery;  Laterality: [name];   TONSILLECTOMY       Home Medications:  Prior to Admission medications   Medication Sig Start Date End Date Taking? Authorizing Provider  acetaminophen (TYLENOL) 325 MG tablet Take 1-2 tablets (325-650 mg total) by mouth every 4 (four) hours as needed for mild pain. Patient taking differently: Take 650 mg by mouth every 4 (four) hours as needed for mild pain. 08/01/21  Yes Love, Ivan Anchors, PA-C  albuterol (VENTOLIN HFA) 108 (90 Base) MCG/ACT inhaler Inhale 2 puffs into the lungs every 6 (six) hours as needed for wheezing. 12/24/21 12/24/22 Yes Emokpae, Courage, MD  ascorbic acid (VITAMIN C) 500 MG tablet Take 1 tablet (500 mg total) by mouth daily. 12/25/21  Yes Roxan Hockey, MD  aspirin 81 MG chewable tablet Chew 1 tablet (81 mg total) by mouth daily with breakfast. 12/24/21  Yes Emokpae, Courage, MD  benztropine (COGENTIN) 0.5 MG tablet Take 1 tablet (0.5 mg total) by mouth at bedtime. 12/08/21 12/08/22 Yes Angiulli, Lavon Paganini, PA-C  carvedilol (COREG) 6.25 MG tablet Take 1 tablet (6.25 mg total) by mouth 2 (two) times daily. 08/22/21  Yes Wellington Hampshire, MD  chlorproMAZINE (THORAZINE) 100 MG tablet TAKE 1 TABLET BY MOUTH EVERYDAY AT BEDTIME 12/08/21  Yes Angiulli, Lavon Paganini, PA-C  clonazePAM (KLONOPIN) 0.5 MG tablet Take 1 tablet (0.5 mg total) by mouth 2 (two) times daily. 12/24/21  Yes Emokpae, Courage, MD  clopidogrel (PLAVIX) 75 MG tablet TAKE ONE TABLET BY MOUTH ONE TIME DAILY 01/10/22  Yes Wellington Hampshire, MD  cyclobenzaprine (FLEXERIL) 10 MG tablet TAKE ONE TABLET BY MOUTH AT BEDTIME 01/25/22  Yes Raulkar, Clide Deutscher, MD  Dulaglutide (TRULICITY) 4.09 WJ/1.9JY SOPN Inject 0.75 mg into the skin once a week. Patient taking differently: Inject 0.75 mg into the skin every Monday. 03/21/21  Yes Renato Shin, MD  escitalopram (LEXAPRO) 20 MG tablet Take 1 tablet (20 mg total) by mouth daily. 01/10/22 01/10/23 Yes Arfeen, Arlyce Harman, MD  Evolocumab (REPATHA  SURECLICK) 782 MG/ML SOAJ Inject 1 Dose into the skin every 14 (fourteen) days. 09/07/21  Yes Hilty, Nadean Corwin, MD  ezetimibe (ZETIA) 10 MG tablet TAKE ONE TABLET BY MOUTH ONE TIME DAILY 01/10/22  Yes Wellington Hampshire, MD  FLUoxetine HCl 60 MG TABS Take 60 mg by mouth daily.   Yes [provider]  icosapent Ethyl (VASCEPA) 1 g capsule Take 2 g by mouth 2 (two) times daily.   Yes [provider]  insulin detemir (LEVEMIR FLEXTOUCH) 100 UNIT/ML FlexPen Inject 20 Units into the skin at bedtime. Patient taking differently: Inject 27 Units into the skin 2 (two) times daily. 12/24/21  Yes Emokpae, Courage, MD  isosorbide mononitrate (IMDUR) 60 MG 24 hr tablet TAKE ONE TABLET BY MOUTH TWICE A DAY 09/25/21  Yes Wellington Hampshire, MD  lamoTRIgine (LAMICTAL) 200 MG tablet Take 1 tablet (200  mg total) by mouth at bedtime. 12/08/21  Yes Angiulli, Lavon Paganini, PA-C  nitroGLYCERIN (NITROSTAT) 0.4 MG SL tablet Place 1 tablet (0.4 mg total) under the tongue every 5 (five) minutes as needed for chest pain. 03/15/21  Yes Wellington Hampshire, MD  pantoprazole (PROTONIX) 40 MG tablet TAKE ONE TABLET BY MOUTH ONE TIME DAILY 01/10/22  Yes Raulkar, Clide Deutscher, MD  pregabalin (LYRICA) 225 MG capsule Take 1 capsule (225 mg total) by mouth 2 (two) times daily. 12/08/21  Yes Angiulli, Lavon Paganini, PA-C  rosuvastatin (CRESTOR) 20 MG tablet Take 1 tablet (20 mg total) by mouth daily. 08/01/21  Yes Love, Ivan Anchors, PA-C  senna-docusate (SENOKOT-S) 8.6-50 MG tablet Take 2 tablets by mouth at bedtime. 12/24/21  Yes Emokpae, Courage, MD  torsemide (DEMADEX) 10 MG tablet Take 1 tablet (10 mg total) by mouth daily. PLEASE SCHEDULE OFFICE VISIT FOR FURTHER REFILLS. THANK YOU! 10/09/21  Yes Wellington Hampshire, MD  vitamin B-12 1000 MCG tablet Take 1 tablet (1,000 mcg total) by mouth daily. 06/29/21  Yes Enzo Bi, MD  zinc sulfate 220 (50 Zn) MG capsule Take 1 capsule (220 mg total) by mouth daily. 12/25/21  Yes Emokpae, Courage, MD   cyanocobalamin (,VITAMIN B-12,) 1000 MCG/ML injection Inject 1 mL (1,000 mcg total) into the skin every 30 (thirty) days. 08/29/21   Love, Ivan Anchors, PA-C  diclofenac Sodium (VOLTAREN) 1 % GEL Apply 2 g topically 4 (four) times daily. Patient not taking: Reported on 01/26/2022 12/08/21   Angiulli, Lavon Paganini, PA-C  Insulin lispro (HUMALOG JUNIOR KWIKPEN) 100 UNIT/ML Inject 0-10 Units into the skin 3 (three) times daily. insulin Lispro (Humalog) injection 0-10 Units 0-10 Units Subcutaneous, 3 times daily with meals CBG < 70: Implement Hypoglycemia Standing Orders and refer to Hypoglycemia Standing Orders sidebar report  CBG 70 - 120: 0 unit CBG 121 - 150: 0 unit  CBG 151 - 200: 1 unit CBG 201 - 250: 2 units CBG 251 - 300: 4 units CBG 301 - 350: 6 units  CBG 351 - 400: 8 units  CBG > 400: 10 units Patient not taking: Reported on 01/26/2022 12/24/21   Roxan Hockey, MD  polyethylene glycol powder (GLYCOLAX/MIRALAX) 17 GM/SCOOP powder MIX 1 CAPFUL IN 8 OUNCES OF FLUID BY MOUTH TWICE A DAY Patient not taking: Reported on 01/26/2022 10/17/21   Izora Ribas, MD  potassium chloride (KLOR-CON) 20 MEQ packet Take 20 mEq by mouth daily. Patient not taking: Reported on 12/22/2021 12/08/21   Angiulli, Lavon Paganini, PA-C  spironolactone (ALDACTONE) 25 MG tablet TAKE ONE TABLET BY MOUTH ONE TIME DAILY Patient not taking: Reported on 01/26/2022 08/25/21   Izora Ribas, MD  traZODone (DESYREL) 50 MG tablet Take 0.5 tablets (25 mg total) by mouth at bedtime as needed for sleep. Patient not taking: Reported on 01/26/2022 12/08/21   Cathlyn Parsons, PA-C    Inpatient Medications: Scheduled Meds:  [START ON 01/27/2022] aspirin  81 mg Oral Q breakfast   benztropine  0.5 mg Oral QHS   chlorproMAZINE  100 mg Oral QHS   [START ON 01/27/2022] clopidogrel  75 mg Oral Daily   [START ON 01/27/2022] escitalopram  20 mg Oral Daily   [START ON 01/27/2022] ezetimibe  10 mg Oral Daily   [START ON 01/27/2022] FLUoxetine  60 mg Oral Daily    icosapent Ethyl  2 g Oral BID   [START ON 01/27/2022] insulin aspart  0-15 Units Subcutaneous TID WC   insulin glargine-yfgn  15  Units Subcutaneous QHS   lamoTRIgine  200 mg Oral QHS   pantoprazole (PROTONIX) IV  40 mg Intravenous Q24H   pregabalin  225 mg Oral BID   [START ON 01/27/2022] rosuvastatin  20 mg Oral Daily   sodium chloride flush  3 mL Intravenous Q12H   Continuous Infusions:  azithromycin 500 mg (01/26/22 1610)   cefTRIAXone (ROCEPHIN)  IV Stopped (01/26/22 1517)   lactated ringers 150 mL/hr at 01/26/22 1943   PRN Meds: acetaminophen **OR** acetaminophen, ondansetron **OR** ondansetron (ZOFRAN) IV, oxyCODONE, polyethylene glycol, traZODone  Allergies:    Allergies  Allergen Reactions   Chantix [Varenicline Tartrate] Other (See Comments)    Feels "crazy" and angry     Social History:   Social History   Socioeconomic History   Marital status: Married    Spouse name: Not on file   Number of children: 1   Years of education: 14   Highest education level: Not on file  Occupational History   Occupation: Disability  Tobacco Use   Smoking status: Former    Packs/day: 0.50    Years: 27.00    Pack years: 13.50    Types: Cigarettes   Smokeless tobacco: Never  Scientific laboratory technician Use: Every day   Substances: Flavoring  Substance and Sexual Activity   Alcohol use: Not Currently    Alcohol/week: 0.0 standard drinks    Comment: socially   Drug use: No   Sexual activity: Yes    Partners: Male    Birth control/protection: None  Other Topics Concern   Not on file  Social History Narrative   11/21/20   From: [name]   Living: with husband, [name] (2009) and special needs daughter    Work: disability       Family: daughter - [name] (special needs, lives with her) and 2 grown step children - [name] and [name]       Enjoys: play with dogs - 2 german Shepard, mut, and blue tick walker mix      Exercise: PAD limits exercise   Diet: diabetic diet and  low potassium due to daughter      Safety   Seat belts: Yes    Guns: Yes  and secure   Safe in relationships: Yes    Social Determinants of Radio broadcast assistant Strain: Not on file  Food Insecurity: Not on file  Transportation Needs: Not on file  Physical Activity: Not on file  Stress: Not on file  Social Connections: Not on file  Intimate Partner Violence: Not on file    Family History:    Family History  Adopted: Yes  Problem Relation Age of Onset   Diabetes Mother    Diabetes Father    Alcohol abuse Father    Heart disease Father    Drug abuse Father    Stroke Sister    Anxiety disorder Sister      ROS:  Please see the history of present illness.  Review of Systems  Constitutional: Negative.   HENT: Negative.    Respiratory:  Positive for shortness of breath.   Cardiovascular: Negative.   Gastrointestinal: Negative.   Musculoskeletal: Negative.        Left leg, left arm weakness, left facial droop  Neurological: Negative.   Psychiatric/Behavioral: Negative.    All other systems reviewed and are negative.   Physical Exam/Data:   Vitals:   01/26/22 1750 01/26/22 1800 01/26/22 1944 01/26/22 2041  BP: 117/73 (!) 116/56 127/71  123/66  Pulse: 66 66  67  Resp: 18 17  16   Temp:   98.3 F (36.8 C) (!) 97.5 F (36.4 C)  TempSrc:   Oral   SpO2: 97% 99%  98%  Weight:      Height:        Intake/Output Summary (Last 24 hours) at 01/26/2022 2237 Last data filed at 01/26/2022 1551 Gross per 24 hour  Intake 2448 ml  Output --  Net 2448 ml   Last 3 Weights 01/26/2022 12/22/2021 12/22/2021  Weight (lbs) 226 lb 208 lb 15.9 oz 206 lb  Weight (kg) 102.513 kg 94.8 kg 93.441 kg  Some encounter information is confidential and restricted. Go to Review Flowsheets activity to see all data.     Body mass index is 34.36 kg/m.  General:  Well nourished, well developed, in no acute distress HEENT: normal Neck: no JVD Vascular: No carotid bruits; Distal pulses 2+  bilaterally Cardiac:  normal S1, S2; RRR; no murmur  Lungs:  clear to auscultation bilaterally, no wheezing, rhonchi or rales  Abd: soft, nontender, no hepatomegaly  Ext: no edema Musculoskeletal:  No deformities,  Skin: warm and dry  Neuro: Full exam not performed, weakness on left Psych: Mildly delayed, slight confusion  EKG:  The EKG was personally reviewed and demonstrates:   Normal sinus rhythm with significant ST-T wave depressions V1 V2, T wave abnormality anterolateral leads, more pronounced compared to prior EKG  Telemetry:  Telemetry was personally reviewed and demonstrates:   Normal sinus rhythm  Relevant CV Studies:   Laboratory Data:  High Sensitivity Troponin:   Recent Labs  Lab 01/26/22 1349 01/26/22 1733 01/26/22 2111  TROPONINIHS 2,535* 2,613* 2,196*     Chemistry Recent Labs  Lab 01/26/22 1349  NA 138  K 3.6  CL 99  CO2 29  GLUCOSE 107*  BUN 16  CREATININE 0.86  CALCIUM 9.4  GFRNONAA >60  ANIONGAP 10    Recent Labs  Lab 01/26/22 1349  PROT 7.1  ALBUMIN 3.4*  AST 30  ALT 16  ALKPHOS 52  BILITOT 0.5   Lipids No results for input(s): CHOL, TRIG, HDL, LABVLDL, LDLCALC, CHOLHDL in the last 168 hours.  Hematology Recent Labs  Lab 01/26/22 1349  WBC 18.1*  RBC 4.23  HGB 11.8*  HCT 38.0  MCV 89.8  MCH 27.9  MCHC 31.1  RDW 15.9*  PLT 295   Thyroid No results for input(s): TSH, FREET4 in the last 168 hours.  BNP Recent Labs  Lab 01/26/22 1733  BNP 351.5*    DDimer No results for input(s): DDIMER in the last 168 hours.   Radiology/Studies:  MR BRAIN WO CONTRAST  Result Date: 01/26/2022 CLINICAL DATA:  Provided history: Left-sided weakness. Possible stroke. EXAM: MRI HEAD WITHOUT CONTRAST TECHNIQUE: Multiplanar, multiecho pulse sequences of the brain and surrounding structures were obtained without intravenous contrast. COMPARISON:  Noncontrast head CT and CT angiogram head/neck performed earlier today 01/26/2022. brain MRI  11/23/2021. FINDINGS: Brain: Mild intermittent motion degradation. Cerebral volume appears normal for age. Redemonstrated chronic cortical/subcortical infarcts within the right parietal and occipital lobes, with associated cortical laminar necrosis and possible chronic blood products at these sites. Multifocal T2 FLAIR hyperintense signal abnormality elsewhere within the cerebral white matter, nonspecific but compatible with age advanced chronic small vessel ischemic disease. Redemonstrated chronic lacunar infarcts within the left basal ganglia and left thalamus. Unchanged chronic infarct within the right pons. As before, there is background extensive T2 FLAIR hyperintense signal abnormality within  the pons and bilateral middle cerebellar peduncles, also likely reflecting chronic small vessel ischemia. There are a few scattered punctate supratentorial chronic parenchymal microhemorrhages. There is no acute infarct. No evidence of an intracranial mass. No extra-axial fluid collection. No midline shift. Vascular: Intracranial arterial vasculature more fully characterized on the same-day CT angiogram head/neck. Skull and upper cervical spine: No focal suspicious marrow lesion. Sinuses/Orbits: Visualized orbits show no acute finding. Mild mucosal thickening within the bilateral ethmoid sinuses. IMPRESSION: No evidence of acute intracranial abnormality. Stable non-contrast MRI appearance of the brain as compared to 11/23/2021. Chronic cortical/subcortical right PCA territory infarcts within the right parietal and occipital lobes. Age-advanced chronic small vessel ischemic changes within the cerebral white matter. Chronic lacunar infarcts within the left basal ganglia and left thalamus. Chronic infarct within the right pons. Background advanced chronic small vessel ischemic changes within the pons and bilateral middle cerebellar peduncles. Mild mucosal thickening within the bilateral ethmoid sinuses. Electronically Signed    By: Kellie Simmering D.O.   On: 01/26/2022 18:44   DG Chest Portable 1 View  Result Date: 01/26/2022 CLINICAL DATA:  hypoxic, eval infiltrate EXAM: PORTABLE CHEST 1 VIEW COMPARISON:  CTA from the same day. Chest radiograph November 21, 2021. FINDINGS: Low lung volumes. Left basilar opacities. No visible pleural effusions or pneumothorax. Enlarged cardiac silhouette, accentuated by low lung volumes and AP technique. CABG and median sternotomy. Superior most two median sternotomy wires are fractured. IMPRESSION: 1. Left basilar opacities, concerning for pneumonia. 2. Cardiomegaly. Electronically Signed   By: Margaretha Sheffield M.D.   On: 01/26/2022 13:54   CT HEAD CODE STROKE WO CONTRAST  Result Date: 01/26/2022 CLINICAL DATA:  Code stroke.  Left arm weakness and facial droop EXAM: CT HEAD WITHOUT CONTRAST TECHNIQUE: Contiguous axial images were obtained from the base of the skull through the vertex without intravenous contrast. RADIATION DOSE REDUCTION: This exam was performed according to the departmental dose-optimization program which includes automated exposure control, adjustment of the mA and/or kV according to patient size and/or use of iterative reconstruction technique. COMPARISON:  Brain MRI 11/23/2021, CT head 09/08/2021 FINDINGS: Brain: There is no evidence of acute intracranial hemorrhage, extra-axial fluid collection, or acute infarct. Background parenchymal volume is normal. The ventricles are normal in size. Remote infarcts in the right parietal and occipital lobes are again noted with hyperdensity overlying the right occipital infarct consistent with cortical laminar necrosis. A remote infarct in the right pons is also again seen. There is no mass lesion.  There is no midline shift. Vascular: No dense vessel is seen. There is calcification of the bilateral cavernous ICAs. Skull: Normal. Negative for fracture or focal lesion. Sinuses/Orbits: Imaged paranasal sinuses are clear. The globes and orbits  are unremarkable. Other: None. ASPECTS [facility] Stroke Program Early CT Score) - Ganglionic level infarction (caudate, lentiform nuclei, internal capsule, insula, M1-M3 cortex): 7 - Supraganglionic infarction (M4-M6 cortex): 3 Total score (0-10 with 10 being normal): 10 IMPRESSION: 1. No acute intracranial hemorrhage or infarct. 2. ASPECTS is 10 3. Remote infarcts as above. These results were called by telephone at the time of interpretation on 01/26/2022 at 1:02 pm to provider Dr Quinn Axe, who verbally acknowledged these results. Electronically Signed   By: Valetta Mole M.D.   On: 01/26/2022 13:05   CT ANGIO HEAD NECK W WO CM W PERF (CODE STROKE)  Result Date: 01/26/2022 CLINICAL DATA:  Left facial droop and arm weakness EXAM: CT ANGIOGRAPHY HEAD AND NECK CT PERFUSION BRAIN TECHNIQUE: Multidetector CT imaging of  the head and neck was performed using the standard protocol during bolus administration of intravenous contrast. Multiplanar CT image reconstructions and MIPs were obtained to evaluate the vascular anatomy. Carotid stenosis measurements (when applicable) are obtained utilizing NASCET criteria, using the distal internal carotid diameter as the denominator. Multiphase CT imaging of the brain was performed following IV bolus contrast injection. Subsequent parametric perfusion maps were calculated using RAPID software. RADIATION DOSE REDUCTION: This exam was performed according to the departmental dose-optimization program which includes automated exposure control, adjustment of the mA and/or kV according to patient size and/or use of iterative reconstruction technique. CONTRAST:  115mL OMNIPAQUE IOHEXOL 350 MG/ML SOLN COMPARISON:  None. Same-day noncontrast head CT, brain MRI 11/23/2021, CTA head/neck 06/24/2021 FINDINGS: CTA NECK FINDINGS Aortic arch: There is calcified atherosclerotic plaque of the aortic arch. The origins of the major branch vessels are patent. Subclavian arteries are patent. There is mixed  plaque at the origin of the right brachiocephalic artery without hemodynamically significant stenosis. Right carotid system: There is soft plaque throughout the right common carotid artery resulting in up to approximately 30% stenosis. There is primarily soft plaque in the right carotid bulb resulting in up to approximally 20% stenosis. The distal right internal carotid artery is patent. The right external carotid artery is occluded at its origin for a short-segment with distal reconstitution, unchanged. There is no dissection or aneurysm. Left carotid system: There is scattered soft and calcified plaque in the left common carotid artery without hemodynamically significant stenosis or occlusion. There is mixed plaque in the proximal left common carotid artery resulting in up to approximately 30% stenosis. The distal left internal carotid artery is patent. The left external carotid artery is patent. There is no evidence of dissection or aneurysm. Vertebral arteries: The right vertebral artery is patent in the neck. The left vertebral artery is occluded from its origin through the mid V2 segment, with reconstitution of flow at the C3-C4 level. This is unchanged compared to the prior study from 06/24/2021. The remainder of the left vertebral artery is patent in the neck. Skeleton: There is no acute osseous abnormality or aggressive osseous lesion. There is no visible canal hematoma. Other neck: Soft tissues are unremarkable. Upper chest: There is patchy ground-glass opacity in the superior segment of the left lower lobe, incompletely imaged. Review of the MIP images confirms the above findings CTA HEAD FINDINGS Anterior circulation: There is calcified atherosclerotic plaque in the bilateral intracranial ICAs resulting in up to moderate stenosis bilaterally. The bilateral MCAs are patent with mild atherosclerotic irregularity. There is no proximal high-grade stenosis or occlusion. The bilateral ACAs are patent with mild  atherosclerotic irregularity. There is no proximal high-grade stenosis or occlusion. There is no aneurysm or AVM. Posterior circulation: The right V4 segment is patent but with multifocal atherosclerotic irregularity resulting in up to mild-to-moderate stenosis (7-239). PICA is identified on the right. There is mixed plaque in the proximal left V4 segment resulting in moderate to severe stenosis. The left vertebral artery is not identified beyond the PICA origin, unchanged. There is multifocal irregularity and narrowing of the basilar artery with up to moderate stenosis proximally (8-108). The basilar artery remains patent to its tip. The superior cerebellar arteries are patent. There is a fetal origin of the bilateral PCAs. There is irregularity of the bilateral P1 segments resulting in up to mild-to-moderate stenosis on the left (8-90). Otherwise, the PCAs are patent with mild multifocal irregularity and narrowing. There is no aneurysm or AVM. Venous sinuses:  Patent. Anatomic variants: None. Review of the MIP images confirms the above findings CT Brain Perfusion Findings: ASPECTS: 10 CBF (<30%) Volume: 110mL Perfusion (Tmax>6.0s) volume: 11mL Mismatch Volume: 14mL Infarction Location:No infarct identified. The area of ischemia is in the left anterior temporal lobe. IMPRESSION: 1. No infarct core identified. 5 cc of ischemia was identified in the left anterior temporal lobe which may be artifactual. Recommend correlation with MRI. 2. No emergent large vessel occlusion. 3. Atherosclerotic plaque in the bilateral carotid systems resulting in up to 30% stenosis in the right common carotid artery and 20% stenosis in the right internal carotid artery, and 30% stenosis of the left internal carotid artery. Findings are not significantly changed. 4. 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 There is significant atherosclerotic disease in the intracranial left V4 segment  resulting in up to moderate to severe stenosis, and the V4 segment is not identified beyond the PICA origin, unchanged. The right vertebral artery is patent with up to mild-to-moderate stenosis. 5. Moderate stenosis of the bilateral intracranial ICAs and proximal basilar artery. No proximal high-grade stenosis or occlusion in the intracranial vasculature. 6. Unchanged occlusion of the right proximal external carotid artery with distal reconstitution. 7. Patchy ground-glass opacity in the left lower lobe may be infectious or inflammatory in nature, incompletely evaluated. Consider dedicated imaging of the chest as indicated. These results were paged to Dr. Quinn Axe via Amion at 1:43 p.m. Electronically Signed   By: Valetta Mole M.D.   On: 01/26/2022 13:46     Assessment and Plan:   Elevated troponin/unable to exclude non-STEMI Markedly elevated though atypical presentation and what seemed more consistent with stroke Has appeared to rule out for stroke with negative CTA, MRI of brain -Possibly in the setting of hypotension, hypoxia Of concern is significant EKG changes, troponin greater than 2000, known severe coronary disease history of CABG -Cleared to start heparin by neurology Given EKG findings, shortness of breath past several days, elevated troponin, will start heparin with small bolus -Would run heparin 48 hours at least May not need catheterization, will defer to weekend team Echocardiogram to rule out new wall motion abnormality Aspirin, heparin infusion, on Plavix, Crestor Zetia Avoid beta-blockers in the setting of hypotension  Pneumonia Significant shortness of breath cough past several days, x-ray concerning for pneumonia Elevated WBC May explain hypotension, hypoxia on arrival On broad-spectrum antibiotics  Left side weakness arm, leg, facial droop Etiology unclear, neurology has signed off, no acute stroke on CT head or MRI Possibly in the setting of hypotension/pneumonia,  cerebral hypoperfusion CT scan documenting significant cerebrovascular disease  Case discussed with hospitalist service, nursing, long discussion with patient and family at the bedside  Total encounter time more than 110 minutes  Greater than 50% was spent in counseling and coordination of care with the patient   For questions or updates, please contact [facility] Please consult [email or web address] for contact info under    Signed, Ida Rogue, MD  01/26/2022 10:37 PM

## 2022-01-26 NOTE — ED Notes (Signed)
This RN called lab to request phlebotomist come draw blood on pt, as her PIV lines are not pulling back blood.

## 2022-01-26 NOTE — Progress Notes (Signed)
CODE STROKE- PHARMACY COMMUNICATION   Time CODE STROKE called/page received:1228  Time response to CODE STROKE was made (in person or via phone): 1235 -in CT scan awaiting arrival of pt  Time Stroke Kit retrieved from [place] (only if needed): n/a  Name of Provider/Nurse contacted:   Past Medical History:  Diagnosis Date   Arthralgia of temporomandibular joint    CAD, multiple vessel    a. 06/2016 Cath: ostLM 40%, ostLAD 40%, pLAD 95%, ost-pLCx 60%, pLCx 95%, mLCx 60%, mRCA 95%, D2 50%, LVSF nl;  b. 07/2016 CABG x 4 (LIMA->LAD, VG->Diag, VG->OM, VG->RCA); c. 08/2016 Cath: 3VD w/ 4/4 patent grafts. LAD distal to LIMA has diff dzs->Med rx; d. 08/2020 Cath: 4/4 patent grafts, native 3VD. EF 55-65%-->Med Rx.   Carotid arterial disease ([place])    a. 07/2016 s/p R CEA; b. 02/2021 U/S: RICA 36-46%, LICA 8-03%.   Clotting disorder ([place])    Depression    Diastolic dysfunction    a. 06/2016 Echo: EF 50-55%, mild inf wall HK, GR1DD, mild MR, RV sys fxn nl, mildly dilated LA, PASP nl   Fatty liver disease, nonalcoholic 2122   History of blood transfusion    with heart surgery   HLD (hyperlipidemia)    Labile hypertension    a. prior renal ngiogram negative for RAS in 03/2016; b. catecholamines and metanephrines normal, mildly elevated renin with normal aldosterone and normal ratio in 02/2016   Myocardial infarction [facility]) 2017   Obesity    PAD (peripheral artery disease) ([place])    a. 09/2018 s/p L SFA stenting; b. 07/2019 Periph Angio: Patent m/d L SFA stent w/ 100% L SFA distal to stent. L AT 100d, L Peroneal diff dzs-->Med Rx; c. 02/2021 ABIs: stable @ 0.61 on R and 0.46 on L.   PTSD (post-traumatic stress disorder)    Tobacco abuse    Type 2 diabetes mellitus ([place]) 12/2015   Prior to Admission medications   Medication Sig Start Date End Date Taking? Authorizing Provider  acetaminophen (TYLENOL) 325 MG tablet Take 1-2 tablets (325-650 mg total) by mouth every 4 (four) hours as needed for mild  pain. Patient taking differently: Take 650 mg by mouth every 4 (four) hours as needed for mild pain. 08/01/21   Love, Ivan Anchors, PA-C  albuterol (VENTOLIN HFA) 108 (90 Base) MCG/ACT inhaler Inhale 2 puffs into the lungs every 6 (six) hours as needed for wheezing. 12/24/21 12/24/22  Roxan Hockey, MD  ascorbic acid (VITAMIN C) 500 MG tablet Take 1 tablet (500 mg total) by mouth daily. 12/25/21   Roxan Hockey, MD  aspirin 81 MG chewable tablet Chew 1 tablet (81 mg total) by mouth daily with breakfast. 12/24/21   Roxan Hockey, MD  baclofen (LIORESAL) 10 MG tablet Take 1 tablet (10 mg total) by mouth at bedtime. 12/08/21   Angiulli, Lavon Paganini, PA-C  benztropine (COGENTIN) 0.5 MG tablet Take 1 tablet (0.5 mg total) by mouth at bedtime. 12/08/21 12/08/22  Angiulli, Lavon Paganini, PA-C  carvedilol (COREG) 6.25 MG tablet Take 1 tablet (6.25 mg total) by mouth 2 (two) times daily. 08/22/21   Wellington Hampshire, MD  chlorproMAZINE (THORAZINE) 100 MG tablet TAKE 1 TABLET BY MOUTH EVERYDAY AT BEDTIME 12/08/21   Angiulli, Lavon Paganini, PA-C  clonazePAM (KLONOPIN) 0.5 MG tablet Take 1 tablet (0.5 mg total) by mouth 2 (two) times daily. 12/24/21   Roxan Hockey, MD  clopidogrel (PLAVIX) 75 MG tablet TAKE ONE TABLET BY MOUTH ONE TIME DAILY 01/10/22   Fletcher Anon,  Mertie Clause, MD  cyanocobalamin (,VITAMIN B-12,) 1000 MCG/ML injection Inject 1 mL (1,000 mcg total) into the skin every 30 (thirty) days. 08/29/21   Love, Ivan Anchors, PA-C  cyanocobalamin 1000 MCG tablet Take 1,000 mcg by mouth daily.    [provider]  cyclobenzaprine (FLEXERIL) 10 MG tablet TAKE ONE TABLET BY MOUTH AT BEDTIME 01/25/22   Raulkar, Clide Deutscher, MD  dexamethasone (DECADRON) 6 MG tablet Take 1 tablet (6 mg total) by mouth daily. 12/24/21   Roxan Hockey, MD  diclofenac Sodium (VOLTAREN) 1 % GEL Apply 2 g topically 4 (four) times daily. 12/08/21   Angiulli, Lavon Paganini, PA-C  Dulaglutide (TRULICITY) 7.94 FE/7.6DY SOPN Inject 0.75 mg into the skin once a week.  03/21/21   Renato Shin, MD  escitalopram (LEXAPRO) 20 MG tablet Take 1 tablet (20 mg total) by mouth daily. 01/10/22 01/10/23  Arfeen, Arlyce Harman, MD  Evolocumab (REPATHA SURECLICK) 709 MG/ML SOAJ Inject 1 Dose into the skin every 14 (fourteen) days. 09/07/21   Hilty, Nadean Corwin, MD  ezetimibe (ZETIA) 10 MG tablet TAKE ONE TABLET BY MOUTH ONE TIME DAILY 01/10/22   Wellington Hampshire, MD  guaiFENesin (MUCINEX) 600 MG 12 hr tablet Take 1 tablet (600 mg total) by mouth 2 (two) times daily. 12/24/21   Roxan Hockey, MD  HYDROcodone-acetaminophen (NORCO/VICODIN) 5-325 MG tablet Take 1 tablet by mouth every 6 (six) hours as needed for severe pain or moderate pain. 12/24/21   Roxan Hockey, MD  insulin detemir (LEVEMIR FLEXTOUCH) 100 UNIT/ML FlexPen Inject 20 Units into the skin at bedtime. 12/24/21   Roxan Hockey, MD  Insulin lispro (HUMALOG JUNIOR KWIKPEN) 100 UNIT/ML Inject 0-10 Units into the skin 3 (three) times daily. insulin Lispro (Humalog) injection 0-10 Units 0-10 Units Subcutaneous, 3 times daily with meals CBG < 70: Implement Hypoglycemia Standing Orders and refer to Hypoglycemia Standing Orders sidebar report  CBG 70 - 120: 0 unit CBG 121 - 150: 0 unit  CBG 151 - 200: 1 unit CBG 201 - 250: 2 units CBG 251 - 300: 4 units CBG 301 - 350: 6 units  CBG 351 - 400: 8 units  CBG > 400: 10 units 12/24/21   Emokpae, Courage, MD  isosorbide mononitrate (IMDUR) 60 MG 24 hr tablet TAKE ONE TABLET BY MOUTH TWICE A DAY 09/25/21   Wellington Hampshire, MD  lamoTRIgine (LAMICTAL) 200 MG tablet Take 1 tablet (200 mg total) by mouth at bedtime. 12/08/21   Angiulli, Lavon Paganini, PA-C  nitroGLYCERIN (NITROSTAT) 0.4 MG SL tablet Place 1 tablet (0.4 mg total) under the tongue every 5 (five) minutes as needed for chest pain. 03/15/21   Wellington Hampshire, MD  omega-3 acid ethyl esters (LOVAZA) 1 g capsule Take 1 g by mouth 2 (two) times daily.    [provider]  pantoprazole (PROTONIX) 40 MG tablet TAKE ONE TABLET BY MOUTH ONE  TIME DAILY 01/10/22   Raulkar, Clide Deutscher, MD  polyethylene glycol powder (GLYCOLAX/MIRALAX) 17 GM/SCOOP powder MIX 1 CAPFUL IN 8 OUNCES OF FLUID BY MOUTH TWICE A DAY 10/17/21   Raulkar, Clide Deutscher, MD  potassium chloride (KLOR-CON) 20 MEQ packet Take 20 mEq by mouth daily. Patient not taking: Reported on 12/22/2021 12/08/21   Angiulli, Lavon Paganini, PA-C  pregabalin (LYRICA) 225 MG capsule Take 1 capsule (225 mg total) by mouth 2 (two) times daily. 12/08/21   Angiulli, Lavon Paganini, PA-C  rosuvastatin (CRESTOR) 20 MG tablet Take 1 tablet (20 mg total) by mouth daily. 08/01/21  Love, Pamela S, PA-C  senna-docusate (SENOKOT-S) 8.6-50 MG tablet Take 2 tablets by mouth at bedtime. 12/24/21   Roxan Hockey, MD  spironolactone (ALDACTONE) 25 MG tablet TAKE ONE TABLET BY MOUTH ONE TIME DAILY 08/25/21   Raulkar, Clide Deutscher, MD  torsemide (DEMADEX) 10 MG tablet Take 1 tablet (10 mg total) by mouth daily. PLEASE SCHEDULE OFFICE VISIT FOR FURTHER REFILLS. THANK YOU! 10/09/21 12/22/21  Wellington Hampshire, MD  traZODone (DESYREL) 50 MG tablet Take 0.5 tablets (25 mg total) by mouth at bedtime as needed for sleep. 12/08/21   Angiulli, Lavon Paganini, PA-C  vitamin B-12 1000 MCG tablet Take 1 tablet (1,000 mcg total) by mouth daily. Patient not taking: Reported on 12/22/2021 06/29/21   Enzo Bi, MD  zinc sulfate 220 (50 Zn) MG capsule Take 1 capsule (220 mg total) by mouth daily. 12/25/21   Roxan Hockey, MD    Noralee Space ,PharmD Clinical Pharmacist  01/26/2022  1:20 PM

## 2022-01-26 NOTE — Consult Note (Signed)
NEUROLOGY CONSULTATION NOTE   Date of service: January 26, 2022 Patient Name: Erika [name] MRN:  [id number] DOB:  [birth year] Reason for consult: stroke code Requesting physician: Dr. Vladimir Crofts _ _ _   _ __   _ __ _ _  __ __   _ __   __ _  History of Present Illness   This is a 49 old woman with a history of CAD, clotting disorder, depression, diastolic dysfunction, nonalcoholic fatty liver disease, hypertension, hyperlipidemia, prior MI, PAD, tobacco abuse, type 2 diabetes with multiple prior strokes followed by Dr. Felecia Shelling who presents with worse than usual L sided weakness and dysarthria.  She has residual left-sided weakness (no movement against gravity left upper extremity and 3 out of 5 strength left lower extremity, dysarthria) at baseline.  Patient states that over the last couple of days she has developed a cough similar to the most recent time she was admitted with sepsis secondary to pneumonia.  She states that over the last couple of days her left leg has gotten weaker than usual and now she has minimal antigravity strength in that leg.  Her dysarthria also began to get worse today.  It is unclear when her last known well was at however it seems that she was not at her recent baseline since at least yesterday.  Stroke code was activated.  CT head showed no acute findings and no blood.  She was not a candidate for TNK due to presenting outside of the window.  CTA showed no new LDL.  There was a 5 cc perfusion deficit on CTP felt to be artifactual.  CNS imaging personally reviewed and d/w radiologist by phone.    ROS   Per HPI: all other systems reviewed and are negative  Past History   I have reviewed the following:  Past Medical History:  Diagnosis Date   Arthralgia of temporomandibular joint    CAD, multiple vessel    a. 06/2016 Cath: ostLM 40%, ostLAD 40%, pLAD 95%, ost-pLCx 60%, pLCx 95%, mLCx 60%, mRCA 95%, D2 50%, LVSF nl;  b. 07/2016 CABG x 4 (LIMA->LAD, VG->Diag,  VG->OM, VG->RCA); c. 08/2016 Cath: 3VD w/ 4/4 patent grafts. LAD distal to LIMA has diff dzs->Med rx; d. 08/2020 Cath: 4/4 patent grafts, native 3VD. EF 55-65%-->Med Rx.   Carotid arterial disease ([place])    a. 07/2016 s/p R CEA; b. 02/2021 U/S: RICA 06-26%, LICA 9-48%.   Clotting disorder ([place])    Depression    Diastolic dysfunction    a. 06/2016 Echo: EF 50-55%, mild inf wall HK, GR1DD, mild MR, RV sys fxn nl, mildly dilated LA, PASP nl   Fatty liver disease, nonalcoholic 5462   History of blood transfusion    with heart surgery   HLD (hyperlipidemia)    Labile hypertension    a. prior renal ngiogram negative for RAS in 03/2016; b. catecholamines and metanephrines normal, mildly elevated renin with normal aldosterone and normal ratio in 02/2016   Myocardial infarction [facility]) 2017   Obesity    PAD (peripheral artery disease) ([place])    a. 09/2018 s/p L SFA stenting; b. 07/2019 Periph Angio: Patent m/d L SFA stent w/ 100% L SFA distal to stent. L AT 100d, L Peroneal diff dzs-->Med Rx; c. 02/2021 ABIs: stable @ 0.61 on R and 0.46 on L.   PTSD (post-traumatic stress disorder)    Tobacco abuse    Type 2 diabetes mellitus ([place]) 12/2015   Past Surgical History:  Procedure Laterality  Date   ABDOMINAL AORTOGRAM W/LOWER EXTREMITY [name] 10/15/2018   Procedure: ABDOMINAL AORTOGRAM W/LOWER EXTREMITY;  Surgeon: Wellington Hampshire, MD;  Location: [place] [facility];  Service: Cardiovascular;  Laterality: [name];   ABDOMINAL AORTOGRAM W/LOWER EXTREMITY Bilateral 08/19/2019   Procedure: ABDOMINAL AORTOGRAM W/LOWER EXTREMITY;  Surgeon: Wellington Hampshire, MD;  Location: [place] [facility];  Service: Cardiovascular;  Laterality: Bilateral;   CARDIAC CATHETERIZATION [name] 06/29/2016   Procedure: Left Heart Cath and Coronary Angiography;  Surgeon: Minna Merritts, MD;  Location: [place] [facility];  Service: Cardiovascular;  Laterality: [name];   CARDIAC CATHETERIZATION [name] 08/29/2016   Procedure: Left Heart Cath  and Cors/Grafts Angiography;  Surgeon: Wellington Hampshire, MD;  Location: [place] [facility];  Service: Cardiovascular;  Laterality: [name];   CESAREAN SECTION     CHOLECYSTECTOMY     CORONARY ARTERY BYPASS GRAFT [name] 07/06/2016   Procedure: CORONARY ARTERY BYPASS GRAFTING (CABG) x four, using left internal mammary artery and right leg greater saphenous vein harvested endoscopically;  Surgeon: Ivin Poot, MD;  Location: [place];  Service: Open Heart Surgery;  Laterality: [name];   ENDARTERECTOMY Right 07/06/2016   Procedure: ENDARTERECTOMY CAROTID;  Surgeon: Rosetta Posner, MD;  Location: [facility] OR;  Service: Vascular;  Laterality: Right;   ENDARTERECTOMY Right 04/27/2020   Procedure: REDO OF RIGHT ENDARTERECTOMY CAROTID;  Surgeon: Rosetta Posner, MD;  Location: [facility] OR;  Service: Vascular;  Laterality: Right;   LEFT HEART CATH AND CORS/GRAFTS ANGIOGRAPHY [name] 08/24/2020   Procedure: LEFT HEART CATH AND CORS/GRAFTS ANGIOGRAPHY;  Surgeon: Wellington Hampshire, MD;  Location: [place] [facility];  Service: Cardiovascular;  Laterality: [name];   PERIPHERAL VASCULAR CATHETERIZATION [name] 04/18/2016   Procedure: Renal Angiography;  Surgeon: Wellington Hampshire, MD;  Location: [place] [facility];  Service: Cardiovascular;  Laterality: [name];   PERIPHERAL VASCULAR INTERVENTION Left 10/15/2018   Procedure: PERIPHERAL VASCULAR INTERVENTION;  Surgeon: Wellington Hampshire, MD;  Location: [place] [facility];  Service: Cardiovascular;  Laterality: Left;  Left superficial femoral   TEE WITHOUT CARDIOVERSION [name] 07/06/2016   Procedure: TRANSESOPHAGEAL ECHOCARDIOGRAM (TEE);  Surgeon: Ivin Poot, MD;  Location: [place];  Service: Open Heart Surgery;  Laterality: [name];   TONSILLECTOMY     Family History  Adopted: Yes  Problem Relation Age of Onset   Diabetes Mother    Diabetes Father    Alcohol abuse Father    Heart disease Father    Drug abuse Father    Stroke Sister    Anxiety disorder Sister    Social History    Socioeconomic History   Marital status: Married    Spouse name: Not on file   Number of children: 1   Years of education: 14   Highest education level: Not on file  Occupational History   Occupation: Disability  Tobacco Use   Smoking status: Former    Packs/day: 0.50    Years: 27.00    Pack years: 13.50    Types: Cigarettes   Smokeless tobacco: Never  Scientific laboratory technician Use: Every day   Substances: Flavoring  Substance and Sexual Activity   Alcohol use: Not Currently    Alcohol/week: 0.0 standard drinks    Comment: socially   Drug use: No   Sexual activity: Yes    Partners: Male    Birth control/protection: None  Other Topics Concern   Not on file  Social History Narrative   11/21/20   From: [name]  Living: with husband, Catalina Antigua (2009) and special needs daughter    Work: disability       Family: daughter - [name] (special needs, lives with her) and 2 grown step children - [name] and [name]       Enjoys: play with dogs - 2 german Shepard, mut, and blue tick walker mix      Exercise: PAD limits exercise   Diet: diabetic diet and low potassium due to daughter      Safety   Seat belts: Yes    Guns: Yes  and secure   Safe in relationships: Yes    Social Determinants of Radio broadcast assistant Strain: Not on file  Food Insecurity: Not on file  Transportation Needs: Not on file  Physical Activity: Not on file  Stress: Not on file  Social Connections: Not on file   Allergies  Allergen Reactions   Chantix [Varenicline Tartrate] Other (See Comments)    Feels "crazy" and angry     Medications   (Not in a hospital admission)     Current Facility-Administered Medications:    azithromycin (ZITHROMAX) 500 mg in sodium chloride 0.9 % 250 mL IVPB, 500 mg, Intravenous, Q24H, Vladimir Crofts, MD   cefTRIAXone (ROCEPHIN) 2 g in sodium chloride 0.9 % 100 mL IVPB, 2 g, Intravenous, Q24H, Vladimir Crofts, MD, Last Rate: 200 mL/hr at  01/26/22 1447, 2 g at 01/26/22 1447   lactated ringers infusion, , Intravenous, Continuous, Vladimir Crofts, MD   sodium chloride 0.9 % bolus 1,000 mL, 1,000 mL, Intravenous, Once, Vladimir Crofts, MD, Last Rate: 999 mL/hr at 01/26/22 1451, 1,000 mL at 01/26/22 1451  Current Outpatient Medications:    acetaminophen (TYLENOL) 325 MG tablet, Take 1-2 tablets (325-650 mg total) by mouth every 4 (four) hours as needed for mild pain. (Patient taking differently: Take 650 mg by mouth every 4 (four) hours as needed for mild pain.), Disp: , Rfl:    albuterol (VENTOLIN HFA) 108 (90 Base) MCG/ACT inhaler, Inhale 2 puffs into the lungs every 6 (six) hours as needed for wheezing., Disp: 18 g, Rfl: 0   ascorbic acid (VITAMIN C) 500 MG tablet, Take 1 tablet (500 mg total) by mouth daily., Disp: 30 tablet, Rfl: 2   aspirin 81 MG chewable tablet, Chew 1 tablet (81 mg total) by mouth daily with breakfast., Disp: 120 tablet, Rfl: 1   baclofen (LIORESAL) 10 MG tablet, Take 1 tablet (10 mg total) by mouth at bedtime., Disp: 30 each, Rfl: 0   benztropine (COGENTIN) 0.5 MG tablet, Take 1 tablet (0.5 mg total) by mouth at bedtime., Disp: 3 tablet, Rfl: 0   carvedilol (COREG) 6.25 MG tablet, Take 1 tablet (6.25 mg total) by mouth 2 (two) times daily., Disp: 180 tablet, Rfl: 1   chlorproMAZINE (THORAZINE) 100 MG tablet, TAKE 1 TABLET BY MOUTH EVERYDAY AT BEDTIME, Disp: 3 tablet, Rfl: 0   clonazePAM (KLONOPIN) 0.5 MG tablet, Take 1 tablet (0.5 mg total) by mouth 2 (two) times daily., Disp: 10 tablet, Rfl: 0   clopidogrel (PLAVIX) 75 MG tablet, TAKE ONE TABLET BY MOUTH ONE TIME DAILY, Disp: 30 tablet, Rfl: 2   cyanocobalamin (,VITAMIN B-12,) 1000 MCG/ML injection, Inject 1 mL (1,000 mcg total) into the skin every 30 (thirty) days., Disp: 1 mL, Rfl: 0   cyanocobalamin 1000 MCG tablet, Take 1,000 mcg by mouth daily., Disp: , Rfl:    cyclobenzaprine (FLEXERIL) 10 MG tablet, TAKE ONE TABLET BY MOUTH AT BEDTIME, Disp: 30  tablet, Rfl:  0   dexamethasone (DECADRON) 6 MG tablet, Take 1 tablet (6 mg total) by mouth daily., Disp: 5 tablet, Rfl: 0   diclofenac Sodium (VOLTAREN) 1 % GEL, Apply 2 g topically 4 (four) times daily., Disp: , Rfl:    Dulaglutide (TRULICITY) 6.71 IW/5.8KD SOPN, Inject 0.75 mg into the skin once a week., Disp: 6 mL, Rfl: 3   escitalopram (LEXAPRO) 20 MG tablet, Take 1 tablet (20 mg total) by mouth daily., Disp: 34 tablet, Rfl: 0   Evolocumab (REPATHA SURECLICK) 983 MG/ML SOAJ, Inject 1 Dose into the skin every 14 (fourteen) days., Disp: 6 mL, Rfl: 3   ezetimibe (ZETIA) 10 MG tablet, TAKE ONE TABLET BY MOUTH ONE TIME DAILY, Disp: 30 tablet, Rfl: 2   guaiFENesin (MUCINEX) 600 MG 12 hr tablet, Take 1 tablet (600 mg total) by mouth 2 (two) times daily., Disp: 60 tablet, Rfl: 0   HYDROcodone-acetaminophen (NORCO/VICODIN) 5-325 MG tablet, Take 1 tablet by mouth every 6 (six) hours as needed for severe pain or moderate pain., Disp: 10 tablet, Rfl: 0   insulin detemir (LEVEMIR FLEXTOUCH) 100 UNIT/ML FlexPen, Inject 20 Units into the skin at bedtime., Disp: 15 mL, Rfl: 3   Insulin lispro (HUMALOG JUNIOR KWIKPEN) 100 UNIT/ML, Inject 0-10 Units into the skin 3 (three) times daily. insulin Lispro (Humalog) injection 0-10 Units 0-10 Units Subcutaneous, 3 times daily with meals CBG < 70: Implement Hypoglycemia Standing Orders and refer to Hypoglycemia Standing Orders sidebar report  CBG 70 - 120: 0 unit CBG 121 - 150: 0 unit  CBG 151 - 200: 1 unit CBG 201 - 250: 2 units CBG 251 - 300: 4 units CBG 301 - 350: 6 units  CBG 351 - 400: 8 units  CBG > 400: 10 units, Disp: 3 mL, Rfl: 3   isosorbide mononitrate (IMDUR) 60 MG 24 hr tablet, TAKE ONE TABLET BY MOUTH TWICE A DAY, Disp: 60 tablet, Rfl: 2   lamoTRIgine (LAMICTAL) 200 MG tablet, Take 1 tablet (200 mg total) by mouth at bedtime., Disp: 3 tablet, Rfl: 0   nitroGLYCERIN (NITROSTAT) 0.4 MG SL tablet, Place 1 tablet (0.4 mg total) under the tongue every 5  (five) minutes as needed for chest pain., Disp: 25 tablet, Rfl: 1   omega-3 acid ethyl esters (LOVAZA) 1 g capsule, Take 1 g by mouth 2 (two) times daily., Disp: , Rfl:    pantoprazole (PROTONIX) 40 MG tablet, TAKE ONE TABLET BY MOUTH ONE TIME DAILY, Disp: 30 tablet, Rfl: 0   polyethylene glycol powder (GLYCOLAX/MIRALAX) 17 GM/SCOOP powder, MIX 1 CAPFUL IN 8 OUNCES OF FLUID BY MOUTH TWICE A DAY, Disp: 238 g, Rfl: 0   potassium chloride (KLOR-CON) 20 MEQ packet, Take 20 mEq by mouth daily. (Patient not taking: Reported on 12/22/2021), Disp: 30 packet, Rfl: 0   pregabalin (LYRICA) 225 MG capsule, Take 1 capsule (225 mg total) by mouth 2 (two) times daily., Disp: 10 capsule, Rfl: 0   rosuvastatin (CRESTOR) 20 MG tablet, Take 1 tablet (20 mg total) by mouth daily., Disp: 30 tablet, Rfl: 0   senna-docusate (SENOKOT-S) 8.6-50 MG tablet, Take 2 tablets by mouth at bedtime., Disp: 120 tablet, Rfl: 0   spironolactone (ALDACTONE) 25 MG tablet, TAKE ONE TABLET BY MOUTH ONE TIME DAILY, Disp: 30 tablet, Rfl: 0   torsemide (DEMADEX) 10 MG tablet, Take 1 tablet (10 mg total) by mouth daily. PLEASE SCHEDULE OFFICE VISIT FOR FURTHER REFILLS. THANK YOU!, Disp: 30 tablet, Rfl: 0   traZODone (DESYREL) 50 MG  tablet, Take 0.5 tablets (25 mg total) by mouth at bedtime as needed for sleep., Disp: , Rfl:    vitamin B-12 1000 MCG tablet, Take 1 tablet (1,000 mcg total) by mouth daily. (Patient not taking: Reported on 12/22/2021), Disp: , Rfl:    zinc sulfate 220 (50 Zn) MG capsule, Take 1 capsule (220 mg total) by mouth daily., Disp: 30 capsule, Rfl: 0  Vitals   Vitals:   01/26/22 1328 01/26/22 1330 01/26/22 1340 01/26/22 1430  BP:  107/64 (!) 106/55 103/63  Pulse:  62 70 64  Resp:  16  (!) 21  Temp:      TempSrc:      SpO2:  96% 99% 99%  Weight: 102.5 kg     Height: 5\' 8"  (1.727 m)        Body mass index is 34.36 kg/m.  Physical Exam   Physical Exam Gen: alert and oriented x3 HEENT: Atraumatic,  normocephalic;mucous membranes moist; oropharynx clear, tongue without atrophy or fasciculations. Neck: Supple, trachea midline. Resp: CTAB, no w/r/r CV: RRR, no m/g/r; nml S1 and S2. 2+ symmetric peripheral pulses. Abd: soft/NT/ND; nabs x 4 quad Extrem: Nml bulk; no cyanosis, clubbing, or edema.  Neuro: *MS: alert and oriented x3 *Speech: mild dysarthria, able to name and repeat *CN:    I: Deferred   II,III: PERRLA, blinks to threat bilat, difficulty cooperating with formal VF testing, optic discs unable to be visualized 2/2 pupillary constriction   III,IV,VI: EOMI w/o nystagmus, no ptosis   V: Sensation intact from V1 to V3 to LT   VII: Eyelid closure was full.  L UMN facial droop   VIII: Hearing intact to voice   IX,X: Voice normal, palate elevates symmetrically    XI: SCM/trap 5/5 bilat   XII: Tongue protrudes midline, no atrophy or fasciculations   *Motor:   Normal bulk.  No tremor, rigidity or bradykinesia. RUE and RLE full strength. LUE no movement. LLE minimal movement against gravity.  *Sensory: Impaired to LT LUE and LLE. No double-simultaneous extinction.  *Coordination:  FNF intact on R *Reflexes:  1+ and symmetric throughout without clonus; toes down-going bilat *Gait: deferred  NIHSS  1a Level of Conscious.: 0 1b LOC Questions: 0 1c LOC Commands: 0 2 Best Gaze: 0 3 Visual: 0 4 Facial Palsy: 1 5a Motor Arm - left: 4 5b Motor Arm - Right: 0 6a Motor Leg - Left: 2 6b Motor Leg - Right: 0 7 Limb Ataxia: 0 8 Sensory: 1 9 Best Language: 0 10 Dysarthria: 1 11 Extinct. and Inatten.: 0  TOTAL: 11   Premorbid mRS = 3   Labs   CBC:  Recent Labs  Lab 01/26/22 1349  WBC 18.1*  NEUTROABS 14.6*  HGB 11.8*  HCT 38.0  MCV 89.8  PLT 376    Basic Metabolic Panel:  Lab Results  Component Value Date   NA 134 (L) 12/21/2021   K 3.6 12/21/2021   CO2 29 12/21/2021   GLUCOSE 134 (H) 12/21/2021   BUN 15 12/21/2021   CREATININE 0.96 12/21/2021   CALCIUM  9.7 12/21/2021   GFRNONAA >60 12/21/2021   GFRAA 106 01/12/2021   Lipid Panel:  Lab Results  Component Value Date   LDLCALC UNABLE TO CALCULATE IF TRIGLYCERIDE OVER 400 mg/dL 06/25/2021   HgbA1c:  Lab Results  Component Value Date   HGBA1C 7.0 (H) 11/21/2021   Urine Drug Screen:     Component Value Date/Time   LABOPIA NONE DETECTED 06/24/2021 2831  COCAINSCRNUR NONE DETECTED 06/24/2021 0833   LABBENZ POSITIVE (A) 06/24/2021 0833   AMPHETMU NONE DETECTED 06/24/2021 0833   THCU NONE DETECTED 06/24/2021 0833   LABBARB NONE DETECTED 06/24/2021 0833    Alcohol Level     Component Value Date/Time   ETH <10 06/24/2021 2423     Impression   This is a 89 old woman with a history of CAD, clotting disorder, depression, diastolic dysfunction, nonalcoholic fatty liver disease, hypertension, hyperlipidemia, prior MI, PAD, tobacco abuse, type 2 diabetes with multiple prior strokes followed by Dr. Felecia Shelling who presents with worse than usual L sided weakness and dysarthria.  Her SBP has been in the 90s despite fluid resuscitation during the stroke code and her cough is concerning for another bout of pneumonia. Current stroke sx could be recrudescence in the setting of infection or new acute infarct potentially related to hypotension.   Recommendations   - Admit to hospitalist service for sepsis workup and stroke rule out - Permissive HTN x48 hrs from sx onset or until stroke ruled out by MRI goal BP <220/110. PRN labetalol or hydralazine if BP above these parameters. Avoid oral antihypertensives. Aggressive fluid resuscitation to avoid hypotension.  - MRI brain wo contrast. If this shows no acute findings, no further stroke workup is indicated at this time  - Continue home dual antiplatelet ASA 81mg  daily + plavix 75mg  daily - q4 hr neuro checks - STAT head CT for any change in neuro exam - Tele - PT/OT/SLP - Patient may f/u with her established outpatient neurologist  I will f/u on her  MRI ______________________________________________________________________   Thank you for the opportunity to take part in the care of this patient. If you have any further questions, please contact the neurology consultation attending.  Signed,  Su Monks, MD [facility] [phone]  If 7pm- 7am, please page neurology on call as listed in [place].

## 2022-01-27 ENCOUNTER — Inpatient Hospital Stay (HOSPITAL_COMMUNITY)
Admit: 2022-01-27 | Discharge: 2022-01-27 | Disposition: A | Discharge status: Home / Self Care | Payer: BC Managed Care – PPO | Attending: Family Medicine | Admitting: Family Medicine

## 2022-01-27 ENCOUNTER — Inpatient Hospital Stay: Payer: BC Managed Care – PPO

## 2022-01-27 ENCOUNTER — Encounter: Payer: Self-pay | Admitting: Family Medicine

## 2022-01-27 DIAGNOSIS — F330 Major depressive disorder, recurrent, mild: Secondary | ICD-10-CM

## 2022-01-27 DIAGNOSIS — I2510 Atherosclerotic heart disease of native coronary artery without angina pectoris: Secondary | ICD-10-CM

## 2022-01-27 DIAGNOSIS — I5032 Chronic diastolic (congestive) heart failure: Secondary | ICD-10-CM

## 2022-01-27 DIAGNOSIS — I214 Non-ST elevation (NSTEMI) myocardial infarction: Secondary | ICD-10-CM

## 2022-01-27 DIAGNOSIS — Z79899 Other long term (current) drug therapy: Secondary | ICD-10-CM

## 2022-01-27 DIAGNOSIS — F3289 Other specified depressive episodes: Secondary | ICD-10-CM

## 2022-01-27 DIAGNOSIS — I701 Atherosclerosis of renal artery: Secondary | ICD-10-CM

## 2022-01-27 DIAGNOSIS — U071 COVID-19: Secondary | ICD-10-CM

## 2022-01-27 DIAGNOSIS — I10 Essential (primary) hypertension: Secondary | ICD-10-CM

## 2022-01-27 DIAGNOSIS — I517 Cardiomegaly: Secondary | ICD-10-CM

## 2022-01-27 DIAGNOSIS — L8990 Pressure ulcer of unspecified site, unspecified stage: Secondary | ICD-10-CM | POA: Diagnosis present

## 2022-01-27 DIAGNOSIS — E7849 Other hyperlipidemia: Secondary | ICD-10-CM

## 2022-01-27 LAB — COMPREHENSIVE METABOLIC PANEL
ALT: 13 U/L (ref 0–44)
AST: 21 U/L (ref 15–41)
Albumin: 2.7 g/dL — ABNORMAL LOW (ref 3.5–5.0)
Alkaline Phosphatase: 46 U/L (ref 38–126)
Anion gap: 5 (ref 5–15)
BUN: 11 mg/dL (ref 6–20)
CO2: 28 mmol/L (ref 22–32)
Calcium: 8.5 mg/dL — ABNORMAL LOW (ref 8.9–10.3)
Chloride: 105 mmol/L (ref 98–111)
Creatinine, Ser: 0.84 mg/dL (ref 0.44–1.00)
GFR, Estimated: >60 mL/min (ref >60)
Glucose, Bld: 78 mg/dL (ref 70–99)
Potassium: 3.3 mmol/L — ABNORMAL LOW (ref 3.5–5.1)
Sodium: 138 mmol/L (ref 135–145)
Total Bilirubin: 0.3 mg/dL (ref 0.3–1.2)
Total Protein: 5.6 g/dL — ABNORMAL LOW (ref 6.5–8.1)

## 2022-01-27 LAB — ECHOCARDIOGRAM COMPLETE
AR max vel: 2.3 cm2
AV Peak grad: 8.6 mmHg
Ao pk vel: 1.47 m/s
Area-P 1/2: 4.15 cm2
Calc EF: 74.6 %
Height: 68 in
S' Lateral: 3.2 cm
Single Plane A2C EF: 69.4 %
Single Plane A4C EF: 83.1 %
Weight: 3616 oz

## 2022-01-27 LAB — CBC
HCT: 33.1 % — ABNORMAL LOW (ref 36.0–46.0)
Hemoglobin: 10.3 g/dL — ABNORMAL LOW (ref 12.0–15.0)
MCH: 27.5 pg (ref 26.0–34.0)
MCHC: 31.1 g/dL (ref 30.0–36.0)
MCV: 88.5 fL (ref 80.0–100.0)
Platelets: 249 10*3/uL (ref 150–400)
RBC: 3.74 MIL/uL — ABNORMAL LOW (ref 3.87–5.11)
RDW: 16.1 % — ABNORMAL HIGH (ref 11.5–15.5)
WBC: 9.9 10*3/uL (ref 4.0–10.5)
nRBC: 0 % (ref 0.0–0.2)

## 2022-01-27 LAB — PROCALCITONIN: Procalcitonin: 0.53 ng/mL

## 2022-01-27 LAB — HEPARIN LEVEL (UNFRACTIONATED)
Heparin Unfractionated: <0.1 IU/mL — ABNORMAL LOW (ref 0.30–0.70)
Heparin Unfractionated: 0.25 IU/mL — ABNORMAL LOW (ref 0.30–0.70)
Heparin Unfractionated: 0.36 IU/mL (ref 0.30–0.70)

## 2022-01-27 LAB — GLUCOSE, CAPILLARY
Glucose-Capillary: 79 mg/dL (ref 70–99)
Glucose-Capillary: 92 mg/dL (ref 70–99)
Glucose-Capillary: 100 mg/dL — ABNORMAL HIGH (ref 70–99)
Glucose-Capillary: 113 mg/dL — ABNORMAL HIGH (ref 70–99)

## 2022-01-27 MED ORDER — CLONAZEPAM 0.25 MG PO TBDP
0.2500 mg | ORAL_TABLET | Freq: Two times a day (BID) | ORAL | Status: DC
  Administered 2022-01-27 – 2022-02-01 (×11): 0.25 mg via ORAL
  Filled 2022-01-27 (×11): qty 1

## 2022-01-27 MED ORDER — OXYCODONE HCL 5 MG PO TABS
2.5000 mg | ORAL_TABLET | ORAL | Status: DC | PRN
  Administered 2022-01-27 – 2022-01-31 (×8): 2.5 mg via ORAL
  Filled 2022-01-27 (×8): qty 1

## 2022-01-27 MED ORDER — PERFLUTREN LIPID MICROSPHERE
1.0000 mL | INTRAVENOUS | Status: AC | PRN
  Administered 2022-01-27: 3 mL via INTRAVENOUS
  Filled 2022-01-27: qty 10

## 2022-01-27 MED ORDER — CLONAZEPAM 0.5 MG PO TABS: 0.2500 mg | ORAL_TABLET | Freq: Two times a day (BID) | ORAL | Status: DC

## 2022-01-27 MED ORDER — HEPARIN BOLUS VIA INFUSION
1200.0000 [IU] | Freq: Once | INTRAVENOUS | Status: AC
  Administered 2022-01-27: 1200 [IU] via INTRAVENOUS
  Filled 2022-01-27: qty 1200

## 2022-01-27 MED ORDER — HEPARIN BOLUS VIA INFUSION
2600.0000 [IU] | Freq: Once | INTRAVENOUS | Status: AC
  Administered 2022-01-27: 2600 [IU] via INTRAVENOUS
  Filled 2022-01-27: qty 2600

## 2022-01-27 MED ORDER — POTASSIUM CHLORIDE CRYS ER 20 MEQ PO TBCR: 40.0000 meq | EXTENDED_RELEASE_TABLET | Freq: Two times a day (BID) | ORAL | Status: DC

## 2022-01-27 MED ORDER — CHLORPROMAZINE HCL 50 MG PO TABS
75.0000 mg | ORAL_TABLET | Freq: Every day | ORAL | Status: DC
  Administered 2022-01-27 – 2022-01-31 (×5): 75 mg via ORAL
  Filled 2022-01-27 (×6): qty 1

## 2022-01-27 MED ORDER — PREGABALIN 75 MG PO CAPS
200.0000 mg | ORAL_CAPSULE | Freq: Two times a day (BID) | ORAL | Status: DC
  Administered 2022-01-27 – 2022-02-01 (×10): 200 mg via ORAL
  Filled 2022-01-27 (×10): qty 1

## 2022-01-27 MED ORDER — ESCITALOPRAM OXALATE 10 MG PO TABS
20.0000 mg | ORAL_TABLET | Freq: Every day | ORAL | Status: DC
  Administered 2022-01-28 – 2022-02-01 (×5): 20 mg via ORAL
  Filled 2022-01-27 (×5): qty 2

## 2022-01-27 NOTE — Progress Notes (Signed)
ANTICOAGULATION CONSULT NOTE - Initial Consult  Pharmacy Consult for Heparin drip Indication: chest pain/ACS  Allergies  Allergen Reactions   Chantix [Varenicline Tartrate] Other (See Comments)    Feels "crazy" and angry     Patient Measurements: Height: 5\' 8"  (172.7 cm) Weight: 98.9 kg (218 lb 0.6 oz) IBW/kg (Calculated) : 63.9 Heparin Dosing Weight: 86.7 kg  Vital Signs: Temp: 98.1 F (36.7 C) (02/04 0801) Temp Source: Axillary (02/04 0801) BP: 97/46 (02/04 0801) Pulse Rate: 71 (02/04 0801)  Labs: Recent Labs    01/26/22 1349 01/26/22 1733 01/26/22 2111 01/27/22 0441 01/27/22 0549  HGB 11.8*  --   --  10.3*  --   HCT 38.0  --   --  33.1*  --   PLT 295  --   --  249  --   APTT 35  --   --   --   --   LABPROT 15.0  --   --   --   --   INR 1.2  --   --   --   --   HEPARINUNFRC  --   --   --   --  <0.10*  CREATININE 0.86  --   --  0.84  --   TROPONINIHS 2,535* 2,613* 2,196*  --   --      Estimated Creatinine Clearance: 100.7 mL/min (by C-G formula based on SCr of 0.84 mg/dL).   Medical History: Past Medical History:  Diagnosis Date   Arthralgia of temporomandibular joint    CAD, multiple vessel    a. 06/2016 Cath: ostLM 40%, ostLAD 40%, pLAD 95%, ost-pLCx 60%, pLCx 95%, mLCx 60%, mRCA 95%, D2 50%, LVSF nl;  b. 07/2016 CABG x 4 (LIMA->LAD, VG->Diag, VG->OM, VG->RCA); c. 08/2016 Cath: 3VD w/ 4/4 patent grafts. LAD distal to LIMA has diff dzs->Med rx; d. 08/2020 Cath: 4/4 patent grafts, native 3VD. EF 55-65%-->Med Rx.   Carotid arterial disease ([place])    a. 07/2016 s/p R CEA; b. 02/2021 U/S: RICA 87-56%, LICA 4-33%.   Clotting disorder ([place])    Depression    Diastolic dysfunction    a. 06/2016 Echo: EF 50-55%, mild inf wall HK, GR1DD, mild MR, RV sys fxn nl, mildly dilated LA, PASP nl   Fatty liver disease, nonalcoholic 2951   History of blood transfusion    with heart surgery   HLD (hyperlipidemia)    Labile hypertension    a. prior renal ngiogram negative for RAS  in 03/2016; b. catecholamines and metanephrines normal, mildly elevated renin with normal aldosterone and normal ratio in 02/2016   Myocardial infarction [facility]) 2017   Obesity    PAD (peripheral artery disease) ([place])    a. 09/2018 s/p L SFA stenting; b. 07/2019 Periph Angio: Patent m/d L SFA stent w/ 100% L SFA distal to stent. L AT 100d, L Peroneal diff dzs-->Med Rx; c. 02/2021 ABIs: stable @ 0.61 on R and 0.46 on L.   PTSD (post-traumatic stress disorder)    Tobacco abuse    Type 2 diabetes mellitus ([place]) 12/2015    Medications:  Scheduled:   aspirin  81 mg Oral Q breakfast   benztropine  0.5 mg Oral QHS   chlorproMAZINE  100 mg Oral QHS   clopidogrel  75 mg Oral Daily   escitalopram  20 mg Oral Daily   ezetimibe  10 mg Oral Daily   FLUoxetine  60 mg Oral Daily   icosapent Ethyl  2 g Oral BID   insulin  aspart  0-15 Units Subcutaneous TID WC   insulin glargine-yfgn  15 Units Subcutaneous QHS   lamoTRIgine  200 mg Oral QHS   pantoprazole (PROTONIX) IV  40 mg Intravenous Q24H   pregabalin  225 mg Oral BID   rosuvastatin  20 mg Oral Daily   sodium chloride flush  3 mL Intravenous Q12H   Infusions:   azithromycin Stopped (01/26/22 1710)   cefTRIAXone (ROCEPHIN)  IV Stopped (01/26/22 1517)   heparin 1,100 Units/hr (01/26/22 2343)   lactated ringers 150 mL/hr at 01/27/22 0251    Assessment: 51 yo F to start Heparin drip for ACS/STEMI. Gb 11.8  plt 295  INR 1.2  aPTT 35 On ASA/plavix PTA per Med Rec.  -  Heparin drip and bolus were ordered on 2/3 around 1730 but not started due to concern for possible CVA. - Neuro ruled out CVA and MD ok'd to start heparin.  - MD requests using smaller heparin bolus of 2000 units (unsure as to why)    2/4 0549 HL= <0.10   subthera, bolus and inc. to   Goal of Therapy:  Heparin level 0.3-0.7 units/ml Monitor platelets by anticoagulation protocol: Yes   Plan:  2/4 0549 HL= <0.10   subtherapeutic, will order bolus 2600 units and increase Drip  to 1350 units/hr Will check HL 6 hrs after rate change F/u CBC daily  Lochlyn Zullo A 01/27/2022,8:21 AM

## 2022-01-27 NOTE — Progress Notes (Signed)
Cross Cover Assisted fall. No injury reported by nursing. Fall precautions in place

## 2022-01-27 NOTE — Progress Notes (Addendum)
Progress Note  Patient Name: Erika [name] Date of Encounter: 01/27/2022  Primary Cardiologist: [name], MD  Subjective   Relatively somnolent.  Family at bedside.  Pt initially opened eyes upon greeting, but then closed her eyes and did not participate in the remainder of the interview.  She did cooperate w/ exam.  Husband says that he noted her to be slurring her yesterday afternoon and her mental status has been off.  She has been relatively somnolent since then.  She did not c/o chest pain prior to arrival per husband.  Inpatient Medications    Scheduled Meds:  aspirin  81 mg Oral Q breakfast   benztropine  0.5 mg Oral QHS   chlorproMAZINE  100 mg Oral QHS   clopidogrel  75 mg Oral Daily   escitalopram  20 mg Oral Daily   ezetimibe  10 mg Oral Daily   FLUoxetine  60 mg Oral Daily   heparin  2,600 Units Intravenous Once   icosapent Ethyl  2 g Oral BID   insulin aspart  0-15 Units Subcutaneous TID WC   insulin glargine-yfgn  15 Units Subcutaneous QHS   lamoTRIgine  200 mg Oral QHS   pantoprazole (PROTONIX) IV  40 mg Intravenous Q24H   pregabalin  225 mg Oral BID   rosuvastatin  20 mg Oral Daily   sodium chloride flush  3 mL Intravenous Q12H   Continuous Infusions:  azithromycin Stopped (01/26/22 1710)   cefTRIAXone (ROCEPHIN)  IV Stopped (01/26/22 1517)   heparin 1,100 Units/hr (01/26/22 2343)   lactated ringers 150 mL/hr at 01/27/22 0251   PRN Meds: acetaminophen **OR** acetaminophen, ondansetron **OR** ondansetron (ZOFRAN) IV, oxyCODONE, polyethylene glycol, traZODone   Vital Signs    Vitals:   01/27/22 0200 01/27/22 0355 01/27/22 0600 01/27/22 0801  BP: (!) 107/58 (!) 104/52  (!) 97/46  Pulse: 72 71  71  Resp: 16 18  18   Temp: 99.6 F (37.6 C)   98.1 F (36.7 C)  TempSrc:    Axillary  SpO2: 98% 94%  95%  Weight:   98.9 kg   Height:        Intake/Output Summary (Last 24 hours) at 01/27/2022 0838 Last data filed at 01/27/2022 0400 Gross per 24  hour  Intake 3535.83 ml  Output --  Net 3535.83 ml   Filed Weights   01/26/22 1328 01/27/22 0600  Weight: 102.5 kg 98.9 kg    Physical Exam   GEN: Obese, somnolent, in no acute distress.  HEENT: Grossly normal.  Neck: Supple, no JVD, carotid bruits, or masses. Cardiac: RRR, 2/6 syst murmur @ the upper sternal borders, no rubs or gallops. No clubbing, cyanosis, edema.  Radials 2+, DP/PT 1+ and equal bilaterally.  Respiratory:  Respirations regular and unlabored, clear to auscultation bilaterally. GI: Obese, soft, nontender, nondistended, BS + x 4. MS: no deformity or atrophy. Skin: warm and dry, no rash. Neuro:  Pt initially opened eyes upon greeting but then closed her eyes and did not participate in the remainder of interview.  She did follow commands during examination (deep breaths/holding breaths). Psych: Somnolent.  Unable to establish orientation.  Labs    Chemistry Recent Labs  Lab 01/26/22 1349 01/27/22 0441  NA 138 138  K 3.6 3.3*  CL 99 105  CO2 29 28  GLUCOSE 107* 78  BUN 16 11  CREATININE 0.86 0.84  CALCIUM 9.4 8.5*  PROT 7.1 5.6*  ALBUMIN 3.4* 2.7*  AST 30 21  ALT 16  13  ALKPHOS 52 46  BILITOT 0.5 0.3  GFRNONAA >60 >60  ANIONGAP 10 5     Hematology Recent Labs  Lab 01/26/22 1349 01/27/22 0441  WBC 18.1* 9.9  RBC 4.23 3.74*  HGB 11.8* 10.3*  HCT 38.0 33.1*  MCV 89.8 88.5  MCH 27.9 27.5  MCHC 31.1 31.1  RDW 15.9* 16.1*  PLT 295 249    Cardiac Enzymes  Recent Labs  Lab 01/26/22 1349 01/26/22 1733 01/26/22 2111  TROPONINIHS 2,535* 2,613* 2,196*      BNP Recent Labs  Lab 01/26/22 1733  BNP 351.5*     Lipids  Lab Results  Component Value Date   CHOL 202 (H) 06/25/2021   HDL 29 (L) 06/25/2021   LDLCALC UNABLE TO CALCULATE IF TRIGLYCERIDE OVER 400 mg/dL 06/25/2021   LDLDIRECT 99.6 (H) 06/25/2021   TRIG 636 (H) 06/25/2021   CHOLHDL 7.0 06/25/2021    HbA1c  Lab Results  Component Value Date   HGBA1C 7.0 (H) 11/21/2021     Radiology    MR BRAIN WO CONTRAST  Result Date: 01/26/2022 CLINICAL DATA:  Provided history: Left-sided weakness. Possible stroke. EXAM: MRI HEAD WITHOUT CONTRAST TECHNIQUE: Multiplanar, multiecho pulse sequences of the brain and surrounding structures were obtained without intravenous contrast. COMPARISON:  Noncontrast head CT and CT angiogram head/neck performed earlier today 01/26/2022. brain MRI 11/23/2021. FINDINGS: Brain: Mild intermittent motion degradation. Cerebral volume appears normal for age. Redemonstrated chronic cortical/subcortical infarcts within the right parietal and occipital lobes, with associated cortical laminar necrosis and possible chronic blood products at these sites. Multifocal T2 FLAIR hyperintense signal abnormality elsewhere within the cerebral white matter, nonspecific but compatible with age advanced chronic small vessel ischemic disease. Redemonstrated chronic lacunar infarcts within the left basal ganglia and left thalamus. Unchanged chronic infarct within the right pons. As before, there is background extensive T2 FLAIR hyperintense signal abnormality within the pons and bilateral middle cerebellar peduncles, also likely reflecting chronic small vessel ischemia. There are a few scattered punctate supratentorial chronic parenchymal microhemorrhages. There is no acute infarct. No evidence of an intracranial mass. No extra-axial fluid collection. No midline shift. Vascular: Intracranial arterial vasculature more fully characterized on the same-day CT angiogram head/neck. Skull and upper cervical spine: No focal suspicious marrow lesion. Sinuses/Orbits: Visualized orbits show no acute finding. Mild mucosal thickening within the bilateral ethmoid sinuses. IMPRESSION: No evidence of acute intracranial abnormality. Stable non-contrast MRI appearance of the brain as compared to 11/23/2021. Chronic cortical/subcortical right PCA territory infarcts within the right parietal and  occipital lobes. Age-advanced chronic small vessel ischemic changes within the cerebral white matter. Chronic lacunar infarcts within the left basal ganglia and left thalamus. Chronic infarct within the right pons. Background advanced chronic small vessel ischemic changes within the pons and bilateral middle cerebellar peduncles. Mild mucosal thickening within the bilateral ethmoid sinuses. Electronically Signed   By: Kellie Simmering D.O.   On: 01/26/2022 18:44   Portable chest 1 View  Result Date: 01/27/2022 CLINICAL DATA:  51 year old female with recent COVID-19. Weakness. Prior CABG. EXAM: PORTABLE CHEST 1 VIEW COMPARISON:  Portable chest 01/26/2022 and earlier. FINDINGS: Portable AP upright view at 0813 hours. Improved lung volumes, with more normal cardiac size and mediastinal contour now stable to that in December. Prior CABG. Visualized tracheal air column is within normal limits. Bilateral infrahilar streaky lung opacity is acute. No pneumothorax, pleural effusion, pulmonary edema or consolidation. Negative visible bowel gas. No acute osseous abnormality identified. IMPRESSION: Improved lung volumes. Bilateral streaky bilateral  lower lung opacity could be atelectasis or COVID-19 pneumonia in this setting. No pleural effusion. Prior CABG, heart size within normal limits and stable since December. Electronically Signed   By: Genevie Ann M.D.   On: 01/27/2022 08:33   DG Chest Portable 1 View  Result Date: 01/26/2022 CLINICAL DATA:  hypoxic, eval infiltrate EXAM: PORTABLE CHEST 1 VIEW COMPARISON:  CTA from the same day. Chest radiograph November 21, 2021. FINDINGS: Low lung volumes. Left basilar opacities. No visible pleural effusions or pneumothorax. Enlarged cardiac silhouette, accentuated by low lung volumes and AP technique. CABG and median sternotomy. Superior most two median sternotomy wires are fractured. IMPRESSION: 1. Left basilar opacities, concerning for pneumonia. 2. Cardiomegaly. Electronically  Signed   By: Margaretha Sheffield M.D.   On: 01/26/2022 13:54   CT HEAD CODE STROKE WO CONTRAST  Result Date: 01/26/2022 CLINICAL DATA:  Code stroke.  Left arm weakness and facial droop EXAM: CT HEAD WITHOUT CONTRAST TECHNIQUE: Contiguous axial images were obtained from the base of the skull through the vertex without intravenous contrast. RADIATION DOSE REDUCTION: This exam was performed according to the departmental dose-optimization program which includes automated exposure control, adjustment of the mA and/or kV according to patient size and/or use of iterative reconstruction technique. COMPARISON:  Brain MRI 11/23/2021, CT head 09/08/2021 FINDINGS: Brain: There is no evidence of acute intracranial hemorrhage, extra-axial fluid collection, or acute infarct. Background parenchymal volume is normal. The ventricles are normal in size. Remote infarcts in the right parietal and occipital lobes are again noted with hyperdensity overlying the right occipital infarct consistent with cortical laminar necrosis. A remote infarct in the right pons is also again seen. There is no mass lesion.  There is no midline shift. Vascular: No dense vessel is seen. There is calcification of the bilateral cavernous ICAs. Skull: Normal. Negative for fracture or focal lesion. Sinuses/Orbits: Imaged paranasal sinuses are clear. The globes and orbits are unremarkable. Other: None. ASPECTS [facility] Stroke Program Early CT Score) - Ganglionic level infarction (caudate, lentiform nuclei, internal capsule, insula, M1-M3 cortex): 7 - Supraganglionic infarction (M4-M6 cortex): 3 Total score (0-10 with 10 being normal): 10 IMPRESSION: 1. No acute intracranial hemorrhage or infarct. 2. ASPECTS is 10 3. Remote infarcts as above. These results were called by telephone at the time of interpretation on 01/26/2022 at 1:02 pm to provider Dr Quinn Axe, who verbally acknowledged these results. Electronically Signed   By: Valetta Mole M.D.   On: 01/26/2022 13:05    CT ANGIO HEAD NECK W WO CM W PERF (CODE STROKE)  Result Date: 01/26/2022 CLINICAL DATA:  Left facial droop and arm weakness EXAM: CT ANGIOGRAPHY HEAD AND NECK CT PERFUSION BRAIN TECHNIQUE: Multidetector CT imaging of the head and neck was performed using the standard protocol during bolus administration of intravenous contrast. Multiplanar CT image reconstructions and MIPs were obtained to evaluate the vascular anatomy. Carotid stenosis measurements (when applicable) are obtained utilizing NASCET criteria, using the distal internal carotid diameter as the denominator. Multiphase CT imaging of the brain was performed following IV bolus contrast injection. Subsequent parametric perfusion maps were calculated using RAPID software. RADIATION DOSE REDUCTION: This exam was performed according to the departmental dose-optimization program which includes automated exposure control, adjustment of the mA and/or kV according to patient size and/or use of iterative reconstruction technique. CONTRAST:  12mL OMNIPAQUE IOHEXOL 350 MG/ML SOLN COMPARISON:  None. Same-day noncontrast head CT, brain MRI 11/23/2021, CTA head/neck 06/24/2021 FINDINGS: CTA NECK FINDINGS Aortic arch: There is calcified atherosclerotic plaque  of the aortic arch. The origins of the major branch vessels are patent. Subclavian arteries are patent. There is mixed plaque at the origin of the right brachiocephalic artery without hemodynamically significant stenosis. Right carotid system: There is soft plaque throughout the right common carotid artery resulting in up to approximately 30% stenosis. There is primarily soft plaque in the right carotid bulb resulting in up to approximally 20% stenosis. The distal right internal carotid artery is patent. The right external carotid artery is occluded at its origin for a short-segment with distal reconstitution, unchanged. There is no dissection or aneurysm. Left carotid system: There is scattered soft and  calcified plaque in the left common carotid artery without hemodynamically significant stenosis or occlusion. There is mixed plaque in the proximal left common carotid artery resulting in up to approximately 30% stenosis. The distal left internal carotid artery is patent. The left external carotid artery is patent. There is no evidence of dissection or aneurysm. Vertebral arteries: The right vertebral artery is patent in the neck. The left vertebral artery is occluded from its origin through the mid V2 segment, with reconstitution of flow at the C3-C4 level. This is unchanged compared to the prior study from 06/24/2021. The remainder of the left vertebral artery is patent in the neck. Skeleton: There is no acute osseous abnormality or aggressive osseous lesion. There is no visible canal hematoma. Other neck: Soft tissues are unremarkable. Upper chest: There is patchy ground-glass opacity in the superior segment of the left lower lobe, incompletely imaged. Review of the MIP images confirms the above findings CTA HEAD FINDINGS Anterior circulation: There is calcified atherosclerotic plaque in the bilateral intracranial ICAs resulting in up to moderate stenosis bilaterally. The bilateral MCAs are patent with mild atherosclerotic irregularity. There is no proximal high-grade stenosis or occlusion. The bilateral ACAs are patent with mild atherosclerotic irregularity. There is no proximal high-grade stenosis or occlusion. There is no aneurysm or AVM. Posterior circulation: The right V4 segment is patent but with multifocal atherosclerotic irregularity resulting in up to mild-to-moderate stenosis (7-239). PICA is identified on the right. There is mixed plaque in the proximal left V4 segment resulting in moderate to severe stenosis. The left vertebral artery is not identified beyond the PICA origin, unchanged. There is multifocal irregularity and narrowing of the basilar artery with up to moderate stenosis proximally  (8-108). The basilar artery remains patent to its tip. The superior cerebellar arteries are patent. There is a fetal origin of the bilateral PCAs. There is irregularity of the bilateral P1 segments resulting in up to mild-to-moderate stenosis on the left (8-90). Otherwise, the PCAs are patent with mild multifocal irregularity and narrowing. There is no aneurysm or AVM. Venous sinuses: Patent. Anatomic variants: None. Review of the MIP images confirms the above findings CT Brain Perfusion Findings: ASPECTS: 10 CBF (<30%) Volume: 77mL Perfusion (Tmax>6.0s) volume: 34mL Mismatch Volume: 62mL Infarction Location:No infarct identified. The area of ischemia is in the left anterior temporal lobe. IMPRESSION: 1. No infarct core identified. 5 cc of ischemia was identified in the left anterior temporal lobe which may be artifactual. Recommend correlation with MRI. 2. No emergent large vessel occlusion. 3. Atherosclerotic plaque in the bilateral carotid systems resulting in up to 30% stenosis in the right common carotid artery and 20% stenosis in the right internal carotid artery, and 30% stenosis of the left internal carotid artery. Findings are not significantly changed. 4. 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  There is significant atherosclerotic disease in the intracranial left V4 segment resulting in up to moderate to severe stenosis, and the V4 segment is not identified beyond the PICA origin, unchanged. The right vertebral artery is patent with up to mild-to-moderate stenosis. 5. Moderate stenosis of the bilateral intracranial ICAs and proximal basilar artery. No proximal high-grade stenosis or occlusion in the intracranial vasculature. 6. Unchanged occlusion of the right proximal external carotid artery with distal reconstitution. 7. Patchy ground-glass opacity in the left lower lobe may be infectious or inflammatory in nature, incompletely evaluated. Consider  dedicated imaging of the chest as indicated. These results were paged to Dr. Quinn Axe via [employer] at 1:43 p.m. Electronically Signed   By: Valetta Mole M.D.   On: 01/26/2022 13:46    Telemetry    RSR, 70's - Personally Reviewed  Cardiac Studies   2D Echocardiogram 06/2021  1. Left ventricular ejection fraction, by estimation, is 60 to 65%. The  left ventricle has normal function. The left ventricle has no regional  wall motion abnormalities. Left ventricular diastolic parameters were  normal.   2. Right ventricular systolic function is normal. The right ventricular  size is normal. Tricuspid regurgitation signal is inadequate for assessing  PA pressure.   3. The mitral valve is normal in structure. No evidence of mitral valve  regurgitation. No evidence of mitral stenosis.   4. The aortic valve is normal in structure. Aortic valve regurgitation is  not visualized. No aortic stenosis is present.   5. The inferior vena cava is normal in size with greater than 50%  respiratory variability, suggesting right atrial pressure of 3 mmHg.  _____________   2D Echocardiogram 2.4.2023  **Pending  Patient Profile     51 y.o. female with a history of CAD status post CABG, carotid arterial disease status post right carotid endarterectomy, peripheral arterial disease status post left SFA stenting with subsequent distal SFA occlusion, HFpEF, hypertension, hyperlipidemia, obesity's, diabetes, tobacco abuse, and stroke, who presented 2/3 w/ AMS and hypoxia - r/o stroke, & PNA.  HsTrop >2k.  Assessment & Plan    1.  Altered mental status/History of stroke:  S/p R PCA territory infarcts in 06/2021.  Pt presented 2/3 after husband noted slurring of speech and altered mental status.  Noted to be hypoxic @ 83% on EMS arrival.  Here CT head and MRI brain w/o acute intracranial abnormalities.  Seen by neuro.  Pt remains relatively somnolent.  Being treated for L basilar PNA.  BPs soft - home BP meds on hold.  On  multiple psych meds which may be contributing to AMS.  2.  NSTEMI/CAD:  In setting of above, and in the absence of c/p, pts HsTrops elevated @ 2535  2613  2196.  ECG w/ new anterolateral ST/T changes.  Heparin initiated 2/3.  Plan to cont heparin x 48 hrs.  With h/o multivessel CAD, will need to consider repeat cath on 2/6, provided that neurologic status improves.  In the interim, will f/u echo today to re-eval LVEF.  Cont asa, plavix, statin, zetia, vascepa.  3.  L basilar PNA: Abx per IM.  4.  Essential HTN:  Pressures have been soft.  ? blocker, nitrate, torsemide, and spiro on hold.  5.  HL:  Cont statin, zetia, vascepa.  Also on repatha @ home.  6.  HFpEF:  Euvolemic on exam.  HR stable, BP soft.  As above, ? blocker, spiro, nitrate, torsemide on hold.  7.  DMII:  Per IM.  8.  Tob abuse:  Needs cessation counseling once more alert.  9.  PAD/Carotid dzs:  No recent claudication per husband.  Ambulation limited by L leg wkns and more recently left ankle fracture. 40-59% RICA stenosis w/ 7-83% LICA stenosis by u/s in 02/2021.  Cont asa/plavix/statin/zetia/vascepa.  10.  Hypokalemia:  supp.  Signed, Murray Hodgkins, NP  01/27/2022, 8:38 AM    For questions or updates, please contact   Please consult [email or web address] for contact info under Cardiology/STEMI.

## 2022-01-27 NOTE — Evaluation (Signed)
Occupational Therapy Evaluation Patient Details Name: Erika [name] MRN: [id number] DOB: 03-26-1971 Today's Date: 01/27/2022   History of Present Illness Pt is a 51 y/o F with PMH: CVA with L sided hemiparesis of her L UE And partial paresis of her L LE, CAD s/p bypass, diastolic dysfunction, former smoker, depression, PAD adn PTSD who presents to ED w/ increased L side weakness and slurred speech. Code Stroke called to care link. CT head showed no acute findings and no blood. MRI w/ No evidence of acute intracranial abnormality. Currently being treated for sepsis. Note: hospital stay 1 MA d/t AMS.   Clinical Impression   Pt seen for OT evaluation this date in setting of acute hospitalization d/t sepsis. She presents this date with some generalized deconditioning and increased weakness of L LE versus her baseline. She currently requires: MIN/MOD A for ADL transfers with hemi walker, SETUP to MIN A for seated UB ADLs, able to use hemi-dressing technique for UB dressing. MOD/MAX A for LB ADLs seated. MAX/TOTAL A for standing LB ADLs such as clothing mgt over hips-pt sustains static balance with hemi walker while OT pulls them up. Pt states that this is not far from her functional baseline since previous stroke and that her daughter is available to assist her. Pt left in recliner with chair alarm at end of session. All needs met and in reach. Will continue to follow acutely. Anticipate pt will require HHOT f/u.      Recommendations for follow up therapy are one component of a multi-disciplinary discharge planning process, led by the attending physician.  Recommendations may be updated based on patient status, additional functional criteria and insurance authorization.   Follow Up Recommendations  Home health OT    Assistance Recommended at Discharge Frequent or constant Supervision/Assistance  Patient can return home with the following A little help with walking and/or transfers;A lot of help  with bathing/dressing/bathroom;Assistance with cooking/housework;Direct supervision/assist for medications management;Assist for transportation;Help with stairs or ramp for entrance    Functional Status Assessment  Patient has had a recent decline in their functional status and demonstrates the ability to make significant improvements in function in a reasonable and predictable amount of time.  Equipment Recommendations  None recommended by OT (has all necessary equipment)    Recommendations for Other Services       Precautions / Restrictions Precautions Precautions: Fall Restrictions Weight Bearing Restrictions: No      Mobility Bed Mobility Overal bed mobility: Needs Assistance Bed Mobility: Supine to Sit     Supine to sit: Min assist, HOB elevated          Transfers Overall transfer level: Needs assistance Equipment used: Hemi-walker Transfers: Sit to/from Stand, Bed to chair/wheelchair/BSC Sit to Stand: Min assist, Mod assist Stand pivot transfers: Min assist, Mod assist         General transfer comment: increased time, does take ~5-6 small shuffling steps from bed to/from [facility]. Able to SPS from bed to recliner at end of session      Balance Overall balance assessment: Needs assistance Sitting-balance support: Single extremity supported, Feet supported Sitting balance-Leahy Scale: Good       Standing balance-Leahy Scale: Fair Standing balance comment: CGA to sustain static standing, MIN A to sustain with taking steps.                           ADL either performed or assessed with clinical judgement   ADL  General ADL Comments: SETUP to MIN A for seated UB ADLs, able to use hemi-dressing technique for UB dressing. MOD/MAX A for LB ADLs seated. MAX/TOTAL A for standing LB ADLs such as clothing mgt over hips-pt sustains static balance with hemi walker while OT pulls them up.     Vision  Patient Visual Report: No change from baseline       Perception     Praxis      Pertinent Vitals/Pain Pain Assessment Pain Assessment: No/denies pain     Hand Dominance Right   Extremity/Trunk Assessment Upper Extremity Assessment Upper Extremity Assessment: RUE deficits/detail;LUE deficits/detail RUE Deficits / Details: ROM WFL. MMT grossly 4/5 LUE Deficits / Details: essentially flaccid, but w/o signficiant contracture, tolerates PROM well   Lower Extremity Assessment Lower Extremity Assessment: Defer to PT evaluation;RLE deficits/detail;LLE deficits/detail RLE Deficits / Details: WFL LLE Deficits / Details: L LE was already her weaker side, but has recent hx of ankle fx requiring boot (~3MA) and endorses decreased strength since then. PF grossly 3+/5, DF 1/5.       Communication Communication Communication: No difficulties   Cognition Arousal/Alertness: Awake/alert Behavior During Therapy: WFL for tasks assessed/performed, Flat affect Overall Cognitive Status: Within Functional Limits for tasks assessed                                       General Comments       Exercises     Shoulder Instructions      Home Living Family/patient expects to be discharged to:: Private residence Living Arrangements: Spouse/significant other;Children Available Help at Discharge: Family;Available PRN/intermittently (spouse works, daughter able to help almost all the time) Type of Home: House Home Access: Ramped entrance     Home Layout: One level     Bathroom Shower/Tub: Teacher, early years/pre: Standard (toilet riser) Bathroom Accessibility: Yes How Accessible: Other (comment) (hemi walker) Home Equipment: Tub bench;Wheelchair - manual;Cane - single point;Other (comment);Rolling Walker (2 wheels);Hospital bed          Prior Functioning/Environment Prior Level of Function : Needs assist       Physical Assist : Mobility (physical);ADLs  (physical) Mobility (physical): Bed mobility;Transfers;Gait ADLs (physical): Bathing;Dressing Mobility Comments: household ambulator using Hemi-walker ADLs Comments: Pt requires Min A from family for ADL's including bathing and dressing.        OT Problem List: Decreased strength;Decreased activity tolerance;Impaired balance (sitting and/or standing);Impaired UE functional use;Impaired tone      OT Treatment/Interventions: Self-care/ADL training;Therapeutic exercise;Neuromuscular education;DME and/or AE instruction;Therapeutic activities;Balance training    OT Goals(Current goals can be found in the care plan section) Acute Rehab OT Goals Patient Stated Goal: to go home OT Goal Formulation: With patient Time For Goal Achievement: 02/10/22 Potential to Achieve Goals: Good ADL Goals Pt Will Perform Upper Body Dressing: with set-up Pt Will Perform Lower Body Dressing: with min assist;with caregiver independent in assisting Pt Will Transfer to Toilet: with min guard assist;bedside commode Pt Will Perform Toileting - Clothing Manipulation and hygiene: with min guard assist;sitting/lateral leans  OT Frequency: Min 2X/week    Co-evaluation              AM-PAC OT "6 Clicks" Daily Activity     Outcome Measure Help from another person eating meals?: A Little Help from another person taking care of personal grooming?: A Little Help from another person toileting, which includes using toliet, bedpan,  or urinal?: A Lot Help from another person bathing (including washing, rinsing, drying)?: A Lot Help from another person to put on and taking off regular upper body clothing?: A Little Help from another person to put on and taking off regular lower body clothing?: A Lot 6 Click Score: 15   End of Session Equipment Utilized During Treatment: Gait belt;Other (comment) (hemi walker) Nurse Communication: Mobility status  Activity Tolerance: Patient tolerated treatment well Patient left: in  chair;with call bell/phone within reach;with chair alarm set  OT Visit Diagnosis: Unsteadiness on feet (R26.81);Muscle weakness (generalized) (M62.81);Other (comment) (other lack of coordination)                Time: 5072-2575 OT Time Calculation (min): 55 min Charges:  OT General Charges $OT Visit: 1 Visit OT Evaluation $OT Eval Moderate Complexity: 1 Mod OT Treatments $Self Care/Home Management : 23-37 mins $Therapeutic Activity: 23-37 mins  Gerrianne Scale, MS, OTR/L ascom [phone] 01/27/22, 5:42 PM

## 2022-01-27 NOTE — Consult Note (Signed)
[facility] Face-to-Face Psychiatry Consult   Reason for Consult:  psych medication verification Referring Physician:  Dr Ouida Sills Patient Identification: Erika [name] MRN:  [id number] Principal Diagnosis: Sepsis [facility]) Diagnosis:  Principal Problem:   Sepsis ([place]) Active Problems:   Major depressive disorder, recurrent episode, mild (HCC)   HLD (hyperlipidemia)   Cluster headache   MDD (major depressive disorder)   Renal artery stenosis (HCC)   Chest pain with high risk for cardiac etiology   NSTEMI (non-ST elevated myocardial infarction) ([place])   Essential hypertension, malignant   Type 2 diabetes mellitus with other specified complication ([place])   CAD in native artery   CAD (coronary artery disease)   Carotid stenosis   S/P CABG x 4   PVD (peripheral vascular disease) ([place])   Chronic pain syndrome   Long term current use of opiate analgesic   Long term prescription benzodiazepine use   Chronic anticoagulation (PLAVIX)   Chronic diastolic CHF (congestive heart failure) (HCC)   Cerebrovascular accident (CVA) due to occlusion of vertebral artery (HCC)   Pressure injury of skin   Cardiomegaly   Polypharmacy   Total Time spent with patient: 1 hour  Subjective:   Erika [name] is a 51 y.o. female patient admitted with cardiac issues, consult for psychiatric medication evaluation.  HPI:  51 yo female with a history of PTSD, anxiety, and depression.  Client of Dr Adele Schilder at [facility]  outpatient.  Per his notes and telephone orders, she is taking Lexapro 20 mg, Lamictal 200 mg, Klonopin 0.5 mg BId and Thorazine 100 mg at bedtime.  The client confirmed this also.  Her last dose of Klonopin was yesterday.  Client was sitting upright in her bed eating lunch with family at her bedside who she did not want to leave.  Reports her sleep is "real good", appetite is "fine" and depression is "fine".  No mental health concerns or needs at this time.  Her MD at the hospital requested a decrease in  medications that may be causing her to be drowsy, decreased Klonopin by half and thorazine by 25 mg, see plan below.  Past Psychiatric History: depression, PTSD, anxiety  Risk to Self:  none Risk to Others:  none Prior Inpatient Therapy:  not recently for psych Prior Outpatient Therapy:  [facility]  Past Medical History:  Past Medical History:  Diagnosis Date   Arthralgia of temporomandibular joint    CAD, multiple vessel    a. 06/2016 Cath: ostLM 40%, ostLAD 40%, pLAD 95%, ost-pLCx 60%, pLCx 95%, mLCx 60%, mRCA 95%, D2 50%, LVSF nl;  b. 07/2016 CABG x 4 (LIMA->LAD, VG->Diag, VG->OM, VG->RCA); c. 08/2016 Cath: 3VD w/ 4/4 patent grafts. LAD distal to LIMA has diff dzs->Med rx; d. 08/2020 Cath: 4/4 patent grafts, native 3VD. EF 55-65%-->Med Rx.   Carotid arterial disease ([place])    a. 07/2016 s/p R CEA; b. 02/2021 U/S: RICA 50-35%, LICA 4-65%.   Clotting disorder ([place])    Depression    Diastolic dysfunction    a. 06/2016 Echo: EF 50-55%, mild inf wall HK, GR1DD, mild MR, RV sys fxn nl, mildly dilated LA, PASP nl; b. 06/2021 Echo: EF 60-65%, no rwma. Nl RV fxn.   Fatty liver disease, nonalcoholic 6812   History of blood transfusion    with heart surgery   HLD (hyperlipidemia)    Labile hypertension    a. prior renal ngiogram negative for RAS in 03/2016; b. catecholamines and metanephrines normal, mildly elevated renin with normal aldosterone and normal  ratio in 02/2016   Myocardial infarction [facility]) 2017   Obesity    PAD (peripheral artery disease) ([place])    a. 09/2018 s/p L SFA stenting; b. 07/2019 Periph Angio: Patent m/d L SFA stent w/ 100% L SFA distal to stent. L AT 100d, L Peroneal diff dzs-->Med Rx; c. 02/2021 ABIs: stable @ 0.61 on R and 0.46 on L.   PTSD (post-traumatic stress disorder)    Tobacco abuse    Type 2 diabetes mellitus ([place]) 12/2015    Past Surgical History:  Procedure Laterality Date   ABDOMINAL AORTOGRAM W/LOWER EXTREMITY [name] 10/15/2018   Procedure: ABDOMINAL AORTOGRAM W/LOWER  EXTREMITY;  Surgeon: Wellington Hampshire, MD;  Location: [place] [facility] LAB;  Service: Cardiovascular;  Laterality: [name];   ABDOMINAL AORTOGRAM W/LOWER EXTREMITY Bilateral 08/19/2019   Procedure: ABDOMINAL AORTOGRAM W/LOWER EXTREMITY;  Surgeon: Wellington Hampshire, MD;  Location: [place] CV LAB;  Service: Cardiovascular;  Laterality: Bilateral;   CARDIAC CATHETERIZATION [name] 06/29/2016   Procedure: Left Heart Cath and Coronary Angiography;  Surgeon: Minna Merritts, MD;  Location: [place] [facility];  Service: Cardiovascular;  Laterality: [name];   CARDIAC CATHETERIZATION [name] 08/29/2016   Procedure: Left Heart Cath and Cors/Grafts Angiography;  Surgeon: Wellington Hampshire, MD;  Location: [place] CV LAB;  Service: Cardiovascular;  Laterality: [name];   CESAREAN SECTION     CHOLECYSTECTOMY     CORONARY ARTERY BYPASS GRAFT [name] 07/06/2016   Procedure: CORONARY ARTERY BYPASS GRAFTING (CABG) x four, using left internal mammary artery and right leg greater saphenous vein harvested endoscopically;  Surgeon: Ivin Poot, MD;  Location: [facility];  Service: Open Heart Surgery;  Laterality: [name];   ENDARTERECTOMY Right 07/06/2016   Procedure: ENDARTERECTOMY CAROTID;  Surgeon: Rosetta Posner, MD;  Location: [facility] OR;  Service: Vascular;  Laterality: Right;   ENDARTERECTOMY Right 04/27/2020   Procedure: REDO OF RIGHT ENDARTERECTOMY CAROTID;  Surgeon: Rosetta Posner, MD;  Location: [facility] OR;  Service: Vascular;  Laterality: Right;   LEFT HEART CATH AND CORS/GRAFTS ANGIOGRAPHY [name] 08/24/2020   Procedure: LEFT HEART CATH AND CORS/GRAFTS ANGIOGRAPHY;  Surgeon: Wellington Hampshire, MD;  Location: [place] CV LAB;  Service: Cardiovascular;  Laterality: [name];   PERIPHERAL VASCULAR CATHETERIZATION [name] 04/18/2016   Procedure: Renal Angiography;  Surgeon: Wellington Hampshire, MD;  Location: [place] CV LAB;  Service: Cardiovascular;  Laterality: [name];   PERIPHERAL VASCULAR INTERVENTION Left 10/15/2018   Procedure: PERIPHERAL VASCULAR  INTERVENTION;  Surgeon: Wellington Hampshire, MD;  Location: [place] CV LAB;  Service: Cardiovascular;  Laterality: Left;  Left superficial femoral   TEE WITHOUT CARDIOVERSION [name] 07/06/2016   Procedure: TRANSESOPHAGEAL ECHOCARDIOGRAM (TEE);  Surgeon: Ivin Poot, MD;  Location: [place];  Service: Open Heart Surgery;  Laterality: [name];   TONSILLECTOMY     Family History:  Family History  Adopted: Yes  Problem Relation Age of Onset   Diabetes Mother    Diabetes Father    Alcohol abuse Father    Heart disease Father    Drug abuse Father    Stroke Sister    Anxiety disorder Sister    Family Psychiatric  History: see above Social History:  Social History   Substance and Sexual Activity  Alcohol Use Not Currently   Alcohol/week: 0.0 standard drinks   Comment: socially     Social History   Substance and Sexual Activity  Drug Use No    Social History   Socioeconomic History   Marital status:  Married    Spouse name: Not on file   Number of children: 1   Years of education: 14   Highest education level: Not on file  Occupational History   Occupation: Disability  Tobacco Use   Smoking status: Former    Packs/day: 0.50    Years: 27.00    Pack years: 13.50    Types: Cigarettes   Smokeless tobacco: Never  Vaping Use   Vaping Use: Every day   Substances: Flavoring  Substance and Sexual Activity   Alcohol use: Not Currently    Alcohol/week: 0.0 standard drinks    Comment: socially   Drug use: No   Sexual activity: Yes    Partners: Male    Birth control/protection: None  Other Topics Concern   Not on file  Social History Narrative   11/21/20   From: [name]   Living: with husband, [name] (2009) and special needs daughter    Work: disability       Family: daughter - [name] (special needs, lives with her) and 2 grown step children - [name] and [name]       Enjoys: play with dogs - 2 german Shepard, mut, and blue tick walker mix      Exercise: PAD  limits exercise   Diet: diabetic diet and low potassium due to daughter      Safety   Seat belts: Yes    Guns: Yes  and secure   Safe in relationships: Yes    Social Determinants of Health   Financial Resource Strain: Not on file  Food Insecurity: Not on file  Transportation Needs: Not on file  Physical Activity: Not on file  Stress: Not on file  Social Connections: Not on file   Additional Social History:    Allergies:   Allergies  Allergen Reactions   Chantix [Varenicline Tartrate] Other (See Comments)    Feels "crazy" and angry     Labs:  Results for orders placed or performed during the hospital encounter of 01/26/22 (from the past 48 hour(s))  Resp Panel by RT-PCR (Flu A&B, Covid) Nasopharyngeal Swab     Status: None   Collection Time: 01/26/22  1:22 PM   Specimen: Nasopharyngeal Swab; Nasopharyngeal(NP) swabs in vial transport medium  Result Value Ref Range   SARS Coronavirus 2 by RT PCR NEGATIVE NEGATIVE    Comment: (NOTE) SARS-CoV-2 target nucleic acids are NOT DETECTED.  The SARS-CoV-2 RNA is generally detectable in upper respiratory specimens during the acute phase of infection. The lowest concentration of SARS-CoV-2 viral copies this assay can detect is 138 copies/mL. A negative result does not preclude SARS-Cov-2 infection and should not be used as the sole basis for treatment or other patient management decisions. A negative result may occur with  improper specimen collection/handling, submission of specimen other than nasopharyngeal swab, presence of viral mutation(s) within the areas targeted by this assay, and inadequate number of viral copies(<138 copies/mL). A negative result must be combined with clinical observations, patient history, and epidemiological information. The expected result is Negative.  Fact Sheet for Patients:  EntrepreneurPulse.com.au  Fact Sheet for Healthcare Providers:   IncredibleEmployment.be  This test is no t yet approved or cleared by the [place] FDA and  has been authorized for detection and/or diagnosis of SARS-CoV-2 by FDA under an Emergency Use Authorization (EUA). This EUA will remain  in effect (meaning this test can be used) for the duration of the COVID-19 declaration under Section 564(b)(1) of the Act, 21 U.S.C.section  360bbb-3(b)(1), unless the authorization is terminated  or revoked sooner.       Influenza A by PCR NEGATIVE NEGATIVE   Influenza B by PCR NEGATIVE NEGATIVE    Comment: (NOTE) The Xpert Xpress SARS-CoV-2/FLU/RSV plus assay is intended as an aid in the diagnosis of influenza from Nasopharyngeal swab specimens and should not be used as a sole basis for treatment. Nasal washings and aspirates are unacceptable for Xpert Xpress SARS-CoV-2/FLU/RSV testing.  Fact Sheet for Patients: EntrepreneurPulse.com.au  Fact Sheet for Healthcare Providers: IncredibleEmployment.be  This test is not yet approved or cleared by the [place] FDA and has been authorized for detection and/or diagnosis of SARS-CoV-2 by FDA under an Emergency Use Authorization (EUA). This EUA will remain in effect (meaning this test can be used) for the duration of the COVID-19 declaration under Section 564(b)(1) of the Act, 21 U.S.C. section 360bbb-3(b)(1), unless the authorization is terminated or revoked.  Performed at [facility], [address] [place], [place] [address]   Lactic acid, plasma     Status: None   Collection Time: 01/26/22  1:27 PM  Result Value Ref Range   Lactic Acid, Venous 1.7 0.5 - 1.9 mmol/L    Comment: Performed at [facility], [address] [place], [place] [address]  Protime-INR     Status: None   Collection Time: 01/26/22  1:49 PM  Result Value Ref Range   Prothrombin Time 15.0 11.4 - 15.2 seconds   INR 1.2 0.8 - 1.2    Comment:  (NOTE) INR goal varies based on device and disease states. Performed at [facility], [address] [place], [place] [address]   APTT     Status: None   Collection Time: 01/26/22  1:49 PM  Result Value Ref Range   aPTT 35 24 - 36 seconds    Comment: Performed at [facility], [address] [place], [place] [address]  CBC     Status: Abnormal   Collection Time: 01/26/22  1:49 PM  Result Value Ref Range   WBC 18.1 (H) 4.0 - 10.5 K/uL   RBC 4.23 3.87 - 5.11 MIL/uL   Hemoglobin 11.8 (L) 12.0 - 15.0 g/dL   HCT 38.0 36.0 - 46.0 %   MCV 89.8 80.0 - 100.0 fL   MCH 27.9 26.0 - 34.0 pg   MCHC 31.1 30.0 - 36.0 g/dL   RDW 15.9 (H) 11.5 - 15.5 %   Platelets 295 150 - 400 K/uL   nRBC 0.0 0.0 - 0.2 %    Comment: Performed at [facility], [address] [place], [place] [address]  Differential     Status: Abnormal   Collection Time: 01/26/22  1:49 PM  Result Value Ref Range   Neutrophils Relative % 81 %   Neutro Abs 14.6 (H) 1.7 - 7.7 K/uL   Lymphocytes Relative 15 %   Lymphs Abs 2.7 0.7 - 4.0 K/uL   Monocytes Relative 3 %   Monocytes Absolute 0.6 0.1 - 1.0 K/uL   Eosinophils Relative 1 %   Eosinophils Absolute 0.1 0.0 - 0.5 K/uL   Basophils Relative 0 %   Basophils Absolute 0.0 0.0 - 0.1 K/uL   Immature Granulocytes 0 %   Abs Immature Granulocytes 0.08 (H) 0.00 - 0.07 K/uL    Comment: Performed at [facility], [place], [place] [address]  Troponin I (High Sensitivity)     Status: Abnormal   Collection Time: 01/26/22  1:49  PM  Result Value Ref Range   Troponin I (High Sensitivity) 2,535 (HH) <18 ng/L    Comment: CRITICAL RESULT CALLED TO, READ BACK BY AND VERIFIED WITH MICHELLE ROMERO @1503  01/26/22 MJU (NOTE) Elevated high sensitivity troponin I (hsTnI) values and significant  changes across serial measurements may suggest ACS but many other  chronic and acute conditions are known to elevate hsTnI results.  Refer to the  "Links" section for chest pain algorithms and additional  guidance. Performed at [facility], [address] [place], [place] [address]   Procalcitonin - Baseline     Status: None   Collection Time: 01/26/22  1:49 PM  Result Value Ref Range   Procalcitonin 0.62 ng/mL    Comment:        Interpretation: PCT > 0.5 ng/mL and <= 2 ng/mL: Systemic infection (sepsis) is possible, but other conditions are known to elevate PCT as well. (NOTE)       Sepsis PCT Algorithm           Lower Respiratory Tract                                      Infection PCT Algorithm    ----------------------------     ----------------------------         PCT < 0.25 ng/mL                PCT < 0.10 ng/mL          Strongly encourage             Strongly discourage   discontinuation of antibiotics    initiation of antibiotics    ----------------------------     -----------------------------       PCT 0.25 - 0.50 ng/mL            PCT 0.10 - 0.25 ng/mL               OR       >80% decrease in PCT            Discourage initiation of                                            antibiotics      Encourage discontinuation           of antibiotics    ----------------------------     -----------------------------         PCT >= 0.50 ng/mL              PCT 0.26 - 0.50 ng/mL                AND       <80% decrease in PCT             Encourage initiation of                                             antibiotics       Encourage continuation           of antibiotics    ----------------------------     -----------------------------        PCT >= 0.50 ng/mL  PCT > 0.50 ng/mL               AND         increase in PCT                  Strongly encourage                                      initiation of antibiotics    Strongly encourage escalation           of antibiotics                                     -----------------------------                                           PCT <= 0.25 ng/mL                                                  OR                                        > 80% decrease in PCT                                      Discontinue / Do not initiate                                             antibiotics  Performed at [facility], [address] [place], [place] [address]   Blood culture (routine x 2)     Status: None (Preliminary result)   Collection Time: 01/26/22  1:49 PM   Specimen: BLOOD  Result Value Ref Range   Specimen Description BLOOD BLOOD RIGHT HAND    Special Requests      BOTTLES DRAWN AEROBIC AND ANAEROBIC Blood Culture adequate volume   Culture      NO GROWTH < 24 HOURS Performed at [facility], [name] [place], [place] [address]    Report Status PENDING   Comprehensive metabolic panel     Status: Abnormal   Collection Time: 01/26/22  1:49 PM  Result Value Ref Range   Sodium 138 135 - 145 mmol/L   Potassium 3.6 3.5 - 5.1 mmol/L   Chloride 99 98 - 111 mmol/L   CO2 29 22 - 32 mmol/L   Glucose, Bld 107 (H) 70 - 99 mg/dL    Comment: Glucose reference range applies only to samples taken after fasting for at least 8 hours.   BUN 16 6 - 20 mg/dL   Creatinine, Ser 0.86 0.44 - 1.00 mg/dL   Calcium 9.4 8.9 - 10.3 mg/dL   Total Protein 7.1 6.5 - 8.1 g/dL   Albumin 3.4 (L) 3.5 -  5.0 g/dL   AST 30 15 - 41 U/L   ALT 16 0 - 44 U/L   Alkaline Phosphatase 52 38 - 126 U/L   Total Bilirubin 0.5 0.3 - 1.2 mg/dL   GFR, Estimated >60 >60 mL/min    Comment: (NOTE) Calculated using the CKD-EPI Creatinine Equation (2021)    Anion gap 10 5 - 15    Comment: Performed at [facility], [address] [place], [place] [address]  Blood culture (routine x 2)     Status: None (Preliminary result)   Collection Time: 01/26/22  4:25 PM   Specimen: BLOOD  Result Value Ref Range   Specimen Description BLOOD BLOOD LEFT HAND    Special Requests      BOTTLES DRAWN AEROBIC ONLY Blood Culture results may not be optimal  due to an inadequate volume of blood received in culture bottles   Culture      NO GROWTH < 24 HOURS Performed at [facility], [address] [place], [place] [address]    Report Status PENDING   Lactic acid, plasma     Status: None   Collection Time: 01/26/22  5:33 PM  Result Value Ref Range   Lactic Acid, Venous 1.9 0.5 - 1.9 mmol/L    Comment: Performed at [facility], [address], [place], [place] [address]  Brain natriuretic peptide     Status: Abnormal   Collection Time: 01/26/22  5:33 PM  Result Value Ref Range   B Natriuretic Peptide 351.5 (H) 0.0 - 100.0 pg/mL    Comment: Performed at [facility], [address] [place], [place] [address]  Troponin I (High Sensitivity)     Status: Abnormal   Collection Time: 01/26/22  5:33 PM  Result Value Ref Range   Troponin I (High Sensitivity) 2,613 (HH) <18 ng/L    Comment: CRITICAL VALUE NOTED. VALUE IS CONSISTENT WITH PREVIOUSLY REPORTED/CALLED VALUE MJU (NOTE) Elevated high sensitivity troponin I (hsTnI) values and significant  changes across serial measurements may suggest ACS but many other  chronic and acute conditions are known to elevate hsTnI results.  Refer to the "Links" section for chest pain algorithms and additional  guidance. Performed at [facility], [address] [place], [place] [address]   Glucose, capillary     Status: None   Collection Time: 01/26/22  8:53 PM  Result Value Ref Range   Glucose-Capillary 78 70 - 99 mg/dL    Comment: Glucose reference range applies only to samples taken after fasting for at least 8 hours.  Troponin I (High Sensitivity)     Status: Abnormal   Collection Time: 01/26/22  9:11 PM  Result Value Ref Range   Troponin I (High Sensitivity) 2,196 (HH) <18 ng/L    Comment: CRITICAL VALUE NOTED. VALUE IS CONSISTENT WITH PREVIOUSLY REPORTED/CALLED VALUE DLB (NOTE) Elevated high sensitivity troponin I (hsTnI) values and significant  changes  across serial measurements may suggest ACS but many other  chronic and acute conditions are known to elevate hsTnI results.  Refer to the "Links" section for chest pain algorithms and additional  guidance. Performed at [facility], [address] [place],  [address]   Procalcitonin     Status: None   Collection Time: 01/27/22  4:41 AM  Result Value Ref Range   Procalcitonin 0.53 ng/mL    Comment:        Interpretation: PCT > 0.5 ng/mL and <= 2 ng/mL: Systemic infection (sepsis) is possible,  but other conditions are known to elevate PCT as well. (NOTE)       Sepsis PCT Algorithm           Lower Respiratory Tract                                      Infection PCT Algorithm    ----------------------------     ----------------------------         PCT < 0.25 ng/mL                PCT < 0.10 ng/mL          Strongly encourage             Strongly discourage   discontinuation of antibiotics    initiation of antibiotics    ----------------------------     -----------------------------       PCT 0.25 - 0.50 ng/mL            PCT 0.10 - 0.25 ng/mL               OR       >80% decrease in PCT            Discourage initiation of                                            antibiotics      Encourage discontinuation           of antibiotics    ----------------------------     -----------------------------         PCT >= 0.50 ng/mL              PCT 0.26 - 0.50 ng/mL                AND       <80% decrease in PCT             Encourage initiation of                                             antibiotics       Encourage continuation           of antibiotics    ----------------------------     -----------------------------        PCT >= 0.50 ng/mL                  PCT > 0.50 ng/mL               AND         increase in PCT                  Strongly encourage                                      initiation of antibiotics    Strongly encourage escalation           of  antibiotics                                     -----------------------------  PCT <= 0.25 ng/mL                                                 OR                                        > 80% decrease in PCT                                      Discontinue / Do not initiate                                             antibiotics  Performed at [facility], [address] [place], [place] [address]   Comprehensive metabolic panel     Status: Abnormal   Collection Time: 01/27/22  4:41 AM  Result Value Ref Range   Sodium 138 135 - 145 mmol/L   Potassium 3.3 (L) 3.5 - 5.1 mmol/L   Chloride 105 98 - 111 mmol/L   CO2 28 22 - 32 mmol/L   Glucose, Bld 78 70 - 99 mg/dL    Comment: Glucose reference range applies only to samples taken after fasting for at least 8 hours.   BUN 11 6 - 20 mg/dL   Creatinine, Ser 0.84 0.44 - 1.00 mg/dL   Calcium 8.5 (L) 8.9 - 10.3 mg/dL   Total Protein 5.6 (L) 6.5 - 8.1 g/dL   Albumin 2.7 (L) 3.5 - 5.0 g/dL   AST 21 15 - 41 U/L   ALT 13 0 - 44 U/L   Alkaline Phosphatase 46 38 - 126 U/L   Total Bilirubin 0.3 0.3 - 1.2 mg/dL   GFR, Estimated >60 >60 mL/min    Comment: (NOTE) Calculated using the CKD-EPI Creatinine Equation (2021)    Anion gap 5 5 - 15    Comment: Performed at [facility], [address] [place], [place] [address]  CBC     Status: Abnormal   Collection Time: 01/27/22  4:41 AM  Result Value Ref Range   WBC 9.9 4.0 - 10.5 K/uL   RBC 3.74 (L) 3.87 - 5.11 MIL/uL   Hemoglobin 10.3 (L) 12.0 - 15.0 g/dL   HCT 33.1 (L) 36.0 - 46.0 %   MCV 88.5 80.0 - 100.0 fL   MCH 27.5 26.0 - 34.0 pg   MCHC 31.1 30.0 - 36.0 g/dL   RDW 16.1 (H) 11.5 - 15.5 %   Platelets 249 150 - 400 K/uL   nRBC 0.0 0.0 - 0.2 %    Comment: Performed at [facility], [place], [place] [address]  Heparin level (unfractionated)     Status: Abnormal   Collection Time: 01/27/22  5:49 AM   Result Value Ref Range   Heparin Unfractionated <0.10 (L) 0.30 - 0.70 IU/mL    Comment: (NOTE) The clinical reportable range upper limit is being lowered to >1.10 to align with the FDA approved guidance for the current laboratory assay.  If heparin results are below expected values, and patient dosage has  been confirmed, suggest follow up testing of antithrombin III levels. Performed at [facility], [address] [place], [place] [address]   Glucose, capillary     Status: None   Collection Time: 01/27/22  8:20 AM  Result Value Ref Range   Glucose-Capillary 79 70 - 99 mg/dL    Comment: Glucose reference range applies only to samples taken after fasting for at least 8 hours.  Glucose, capillary     Status: None   Collection Time: 01/27/22 11:49 AM  Result Value Ref Range   Glucose-Capillary 92 70 - 99 mg/dL    Comment: Glucose reference range applies only to samples taken after fasting for at least 8 hours.   *Note: Due to a large number of results and/or encounters for the requested time period, some results have not been displayed. A complete set of results can be found in Results Review.    Current Facility-Administered Medications  Medication Dose Route Frequency Provider Last Rate Last Admin   acetaminophen (TYLENOL) tablet 650 mg  650 mg Oral Q6H PRN Clarnce Flock, MD       Or   acetaminophen (TYLENOL) suppository 650 mg  650 mg Rectal Q6H PRN Clarnce Flock, MD       aspirin chewable tablet 81 mg  81 mg Oral Q breakfast Clarnce Flock, MD   81 mg at 01/27/22 2841   chlorproMAZINE (THORAZINE) tablet 100 mg  100 mg Oral QHS Clarnce Flock, MD   100 mg at 01/26/22 2339   clopidogrel (PLAVIX) tablet 75 mg  75 mg Oral Daily Clarnce Flock, MD   75 mg at 01/27/22 0858   [START ON 01/28/2022] escitalopram (LEXAPRO) tablet 20 mg  20 mg Oral Daily Patrecia Pour, NP       ezetimibe (ZETIA) tablet 10 mg  10 mg Oral Daily Clarnce Flock, MD   10 mg  at 01/27/22 0859   heparin ADULT infusion 100 units/mL (25000 units/222mL)  1,350 Units/hr Intravenous Continuous Noralee Space, RPH 13.5 mL/hr at 01/27/22 1005 1,350 Units/hr at 01/27/22 1005   icosapent Ethyl (VASCEPA) 1 g capsule 2 g  2 g Oral BID Clarnce Flock, MD   2 g at 01/27/22 0901   lamoTRIgine (LAMICTAL) tablet 200 mg  200 mg Oral QHS Clarnce Flock, MD   200 mg at 01/26/22 2149   ondansetron [facility]) tablet 4 mg  4 mg Oral Q6H PRN Clarnce Flock, MD       Or   ondansetron [facility]) injection 4 mg  4 mg Intravenous Q6H PRN Clarnce Flock, MD       oxyCODONE (Oxy IR/ROXICODONE) immediate release tablet 2.5 mg  2.5 mg Oral Q4H PRN Richarda Osmond, MD       polyethylene glycol (MIRALAX / GLYCOLAX) packet 17 g  17 g Oral Daily PRN Clarnce Flock, MD       pregabalin (LYRICA) capsule 200 mg  200 mg Oral BID Richarda Osmond, MD       rosuvastatin (CRESTOR) tablet 20 mg  20 mg Oral Daily Clarnce Flock, MD   20 mg at 01/27/22 3244   sodium chloride flush (NS) 0.9 % injection 3 mL  3 mL Intravenous Q12H Clarnce Flock, MD       Facility-Administered Medications Ordered in Other Encounters  Medication Dose Route Frequency Provider Last Rate Last Admin   perflutren lipid microspheres (DEFINITY) IV suspension  1-10 mL Intravenous PRN  Clarnce Flock, MD   3 mL at 01/27/22 1238    Musculoskeletal: Strength & Muscle Tone: decreased Gait & Station:  did not witness Patient leans: [name]  Psychiatric Specialty Exam: Physical Exam Vitals and nursing note reviewed.  Constitutional:      Appearance: Normal appearance.  HENT:     Head: Normocephalic.     Nose: Nose normal.  Pulmonary:     Effort: Pulmonary effort is normal.  Musculoskeletal:     Cervical back: Normal range of motion.  Neurological:     General: No focal deficit present.     Mental Status: She is alert and oriented to person, place, and time.  Psychiatric:        Attention and  Perception: Attention and perception normal.        Mood and Affect: Mood is depressed.        Speech: Speech normal.        Behavior: Behavior normal. Behavior is cooperative.        Thought Content: Thought content normal.        Cognition and Memory: Cognition and memory normal.        Judgment: Judgment normal.    Review of Systems  Constitutional:  Positive for malaise/fatigue.  Psychiatric/Behavioral:  Positive for depression.   All other systems reviewed and are negative.  Blood pressure (!) 111/53, pulse 64, temperature 98 F (36.7 C), resp. rate 19, height 5\' 8"  (1.727 m), weight 98.9 kg, SpO2 99 %.Body mass index is 33.15 kg/m.  General Appearance: Casual  Eye Contact:  Good  Speech:  Normal Rate  Volume:  Normal  Mood:  Depressed, minimal  Affect:  Non-Congruent  Thought Process:  Coherent and Descriptions of Associations: Intact  Orientation:  Full (Time, Place, and Person)  Thought Content:  WDL and Logical  Suicidal Thoughts:  No  Homicidal Thoughts:  No  Memory:  Immediate;   Good Recent;   Good Remote;   Good  Judgement:  Good  Insight:  Good  Psychomotor Activity:  Decreased  Concentration:  Concentration: Good and Attention Span: Good  Recall:  Good  Fund of Knowledge:  Good  Language:  Good  Akathisia:  No  Handed:  Right  AIMS (if indicated):     Assets:  Housing Leisure Time Resilience Social Support  ADL's:  Intact  Cognition:  WNL  Sleep:        Physical Exam: Physical Exam Vitals and nursing note reviewed.  Constitutional:      Appearance: Normal appearance.  HENT:     Head: Normocephalic.     Nose: Nose normal.  Pulmonary:     Effort: Pulmonary effort is normal.  Musculoskeletal:     Cervical back: Normal range of motion.  Neurological:     General: No focal deficit present.     Mental Status: She is alert and oriented to person, place, and time.  Psychiatric:        Attention and Perception: Attention and perception normal.         Mood and Affect: Mood is depressed.        Speech: Speech normal.        Behavior: Behavior normal. Behavior is cooperative.        Thought Content: Thought content normal.        Cognition and Memory: Cognition and memory normal.        Judgment: Judgment normal.   Review of Systems  Constitutional:  Positive for  malaise/fatigue.  Psychiatric/Behavioral:  Positive for depression.   All other systems reviewed and are negative. Blood pressure (!) 111/53, pulse 64, temperature 98 F (36.7 C), resp. rate 19, height 5\' 8"  (1.727 m), weight 98.9 kg, SpO2 99 %. Body mass index is 33.15 kg/m.  Treatment Plan Summary: Major depressive disorder, recurrent, mild: Continue Lexapro 20 mg daily Continue Lamictal 200 mg daily  Sleep and mood: -Decreased thorazine from 100 mg daily at bedtime to 75 mg daily at bedtime  Anxiety: Discontinue Klonopin 0.5 mg BId to 0.25 mg BID  Disposition: No evidence of imminent risk to self or others at present.   Patient does not meet criteria for psychiatric inpatient admission. Supportive therapy provided about ongoing stressors.  Waylan Boga, NP 01/27/2022 1:28 PM

## 2022-01-27 NOTE — Progress Notes (Signed)
°   01/27/22 2149  What Happened  Was fall witnessed? Yes  Who witnessed fall? Jake Samples, RN  Patients activity before fall ambulating-assisted;bathroom-assisted  Point of contact hip/leg;arm/shoulder  Was patient injured? No  Follow Up  MD notified Sharion Settler, NP  Time MD notified 2147  Family notified No - patient refusal  Additional tests No  Progress note created (see row info) Yes  Adult Fall Risk Assessment  Risk Factor Category (scoring not indicated) Fall has occurred during this admission (document High fall risk)  Patient Fall Risk Level High fall risk  Adult Fall Risk Interventions  Required Bundle Interventions *See Row Information* High fall risk - low, moderate, and high requirements implemented  Additional Interventions Room near nurses station;Use of appropriate toileting equipment (bedpan, BSC, etc.)  Screening for Fall Injury Risk (To be completed on HIGH fall risk patients) - Assessing Need for Floor Mats  Risk For Fall Injury- Criteria for Floor Mats Previous fall this admission  Will Implement Floor Mats Yes  Vitals  Temp 98.8 F (37.1 C)  BP (!) 170/71  MAP (mmHg) 100  BP Location Left Arm  BP Method Automatic  Patient Position (if appropriate) Lying  Pulse Rate 87  Pulse Rate Source Monitor  Resp 20  Oxygen Therapy  SpO2 95 %  O2 Device Room Air  PCA/Epidural/Spinal Assessment  Respiratory Pattern Regular;Unlabored  Neurological  Neuro (WDL) X  Level of Consciousness Alert  Orientation Level Oriented X4  Cognition Appropriate at baseline  Speech Clear  Pupil Assessment  Yes  R Pupil Size (mm) 3  R Pupil Shape Round  R Pupil Reaction Brisk  L Pupil Size (mm) 3  L Pupil Shape Round  L Pupil Reaction Brisk  Additional Pupil Assessments No  Motor Function/Sensation Assessment Grip;Dorsiflexion;Plantar flexion  R Hand Grip Strong  L Hand Grip Absent   R Foot Dorsiflexion Present;Strong  L Foot Dorsiflexion Absent  R Foot Plantar  Flexion Present;Strong  L Foot Plantar Flexion Present  Neuro Symptoms None  Neuro Additional Assessments No  Glasgow Coma Scale  Eye Opening 4  Best Verbal Response (NON-intubated) 5  Best Motor Response 6  Glasgow Coma Scale Score 15  Musculoskeletal  Musculoskeletal (WDL) X  Assistive Device BSC;Standard walker  Generalized Weakness Yes  Weight Bearing Restrictions No  Musculoskeletal Details  LUE Paralysis  LLE Limited movement;Weakness  Integumentary  Integumentary (WDL) X  Skin Color Appropriate for ethnicity  Skin Condition Dry  Skin Integrity Abrasion;Ecchymosis  Abrasion Location Buttocks  Abrasion Location Orientation Right  Abrasion Intervention Foam  Ecchymosis Location Buttocks  Ecchymosis Location Orientation Right  Ecchymosis Intervention Other (Comment) (assessed)  Skin Turgor Non-tenting

## 2022-01-27 NOTE — Progress Notes (Signed)
PROGRESS NOTE  Erika [name]    DOB: [birth year], 51 y.o.  KZS:[id number]  PCP: Lesleigh Noe, MD   Code Status: DNR   DOA: 01/26/2022   LOS: 1  Brief Narrative of Current Hospitalization  Erika [name] is a 51 y.o. female with a PMH significant for  severe vasculopathy with multiple complications including CAD status post CABG x4, CVA, carotid stenosis, renal artery stenosis, as well as diastolic CHF, depression/anxiety, chronic opiate and benzodiazepine use, essential hypertension, diabetes, hyperlipidemia, and chronic pain. They presented from home to the ED on 01/26/2022 with AMS. EMS was called and she was brought to ED for evaluation of suspected stroke. Her head CT/brain MRI were negative for acute stroke. Neurology was consulted for evaluation. In the ED, it was found that they had elevated troponins and ECG abnormalities concerning for NSTEMI. Cardiology was consulted to evaluate. They were treated with aspirin, heparin bolus + infusion, IV antibiotics, IV fluid support.  Patient was admitted to medicine service for further workup and management of NSTEMI, AMS as outlined in detail below.  01/27/22 -stable  Assessment & Plan  Principal Problem:   Sepsis ([place]) Active Problems:   HLD (hyperlipidemia)   Cluster headache   MDD (major depressive disorder)   Renal artery stenosis (HCC)   Chest pain with high risk for cardiac etiology   NSTEMI (non-ST elevated myocardial infarction) ([place])   Essential hypertension, malignant   Type 2 diabetes mellitus with other specified complication ([place])   CAD in native artery   CAD (coronary artery disease)   Carotid stenosis   S/P CABG x 4   PVD (peripheral vascular disease) (HCC)   Chronic pain syndrome   Long term current use of opiate analgesic   Long term prescription benzodiazepine use   Chronic anticoagulation (PLAVIX)   Chronic diastolic CHF (congestive heart failure) (HCC)   Cerebrovascular accident (CVA) due to  occlusion of vertebral artery (HCC)   Pressure injury of skin  Severe sepsis- charted by admitting provider but she does not meet criteria as she had normal BP, HR, temperature, LA.  Possible CAP- O2 requirement on admission up to maximum of 2L. Currently on 1.5L [place] without respiratory distress. Chest xray findings are consistent with post-COVID pneumonia  - hold antibiotics and continue to monitor respiratory status closely  Delirium   polypharmacy   CVA rule out- multifactorial as described below. Patient and family state that she is close to her baseline mental status at this time but she still endorses some abnormalities with thought process/sensory perception on limbs which is intermittent. Suspect involvement of medication side effects with presenting symptoms as they have been intermittent and witnessed by staff after medication administration. She also has multiple antihypertensives but has had low blood pressures inpatient despite holding those medications so could have component of poor perfusion causing her presenting symptoms.  Head imaging thus far has been negative for acute CVA.  - limit sedative medications. Patient was seen to have significant sedation when given her home medications inpatient.  - neurology following, appreciate recs - PT/OT/SLP - CMP am - psych consult to help guide psychiatric medication alterations if needed. Patient observed to have significant sedation following her home medication administration   NSTEMI   CAD s/p CABG   h/o MI   Cardiomegaly - cardiology managing, appreciate care - repeat echo pending - potentially cath 2/6 - continue heparin gtt >/=48 hours   Hx CVA - continue zetia, rosuvastatin   Anxiety/depression- chronic, stable -  continue Thorazine, clonazepam, escitalopram, fluoxetine, Lamictal  Type II DM-most recent hgb A1c 11/22 was 7.0. blood sugars have been borderline low while inpatient.  - hold insulin and monitor  HTN-given  relative hypotension  - hold home carvedilol, Imdur, torsemide, spironolactone, potassium  GERD- - discontinue PPI   Neuropathy -continue pregabalin  DVT prophylaxis: heparin gtt  Diet:  Diet Orders (From admission, onward)     Start     Ordered   01/26/22 2123  Diet heart healthy/carb modified Room service appropriate? Yes; Fluid consistency: Thin  Diet effective now       Question Answer Comment  Diet-HS Snack? Nothing   Room service appropriate? Yes   Fluid consistency: Thin      01/26/22 2122            Subjective 01/27/22    Pt reports having intermittent symptom return of non-specific "feeling weird". Not currently experiencing at time of exam. She denies further complaints.   Disposition Plan & Communication  Patient status: Inpatient  Admitted From: Home Disposition: Home Anticipated discharge date: TBD  Family Communication: husband and daughter at bedside  Consults, Procedures, Significant Events  Consultants:  Psych Neurology Cardiology   Procedures/significant events:  Brain MRI  Antimicrobials:  Anti-infectives (From admission, onward)    Start     Dose/Rate Route Frequency Ordered Stop   01/26/22 1445  cefTRIAXone (ROCEPHIN) 2 g in sodium chloride 0.9 % 100 mL IVPB        2 g 200 mL/hr over 30 Minutes Intravenous Every 24 hours 01/26/22 1439 01/31/22 1444   01/26/22 1445  azithromycin (ZITHROMAX) 500 mg in sodium chloride 0.9 % 250 mL IVPB        500 mg 250 mL/hr over 60 Minutes Intravenous Every 24 hours 01/26/22 1439 01/31/22 1444       Objective   Vitals:   01/26/22 2041 01/27/22 0200 01/27/22 0355 01/27/22 0600  BP: 123/66 (!) 107/58 (!) 104/52   Pulse: 67 72 71   Resp: 16 16 18    Temp: (!) 97.5 F (36.4 C) 99.6 F (37.6 C)    TempSrc:      SpO2: 98% 98% 94%   Weight:    98.9 kg  Height:        Intake/Output Summary (Last 24 hours) at 01/27/2022 0739 Last data filed at 01/27/2022 0400 Gross per 24 hour  Intake 3535.83 ml   Output --  Net 3535.83 ml   Filed Weights   01/26/22 1328 01/27/22 0600  Weight: 102.5 kg 98.9 kg    Patient BMI: Body mass index is 33.15 kg/m.   Physical Exam:  General: awake, alert, NAD HEENT: atraumatic, clear conjunctiva, anicteric sclera, MMM, hearing grossly normal Respiratory: normal respiratory effort. Cardiovascular: quick capillary refill  Nervous: A&O x3. no gross focal neurologic deficits, normal speech Extremities: moves all equally, no edema, normal tone Skin: dry, intact, normal temperature, normal color. No rashes, lesions or ulcers on exposed skin Psychiatry: normal mood, congruent affect  Labs   I have personally reviewed following labs and imaging studies CBC    Component Value Date/Time   WBC 9.9 01/27/2022 0441   RBC 3.74 (L) 01/27/2022 0441   HGB 10.3 (L) 01/27/2022 0441   HGB 14.2 09/11/2021 1413   HCT 33.1 (L) 01/27/2022 0441   HCT 43.5 09/11/2021 1413   PLT 249 01/27/2022 0441   PLT 433 09/11/2021 1413   MCV 88.5 01/27/2022 0441   MCV 89 09/11/2021 1413   MCV 89  01/11/2015 1022   MCH 27.5 01/27/2022 0441   MCHC 31.1 01/27/2022 0441   RDW 16.1 (H) 01/27/2022 0441   RDW 12.8 09/11/2021 1413   RDW 13.8 01/11/2015 1022   LYMPHSABS 2.7 01/26/2022 1349   LYMPHSABS 2.6 09/11/2021 1413   LYMPHSABS 3.4 01/11/2014 1312   MONOABS 0.6 01/26/2022 1349   MONOABS 0.7 01/11/2014 1312   EOSABS 0.1 01/26/2022 1349   EOSABS 0.2 09/11/2021 1413   EOSABS 0.3 01/11/2014 1312   BASOSABS 0.0 01/26/2022 1349   BASOSABS 0.0 09/11/2021 1413   BASOSABS 0.1 01/11/2014 1312   BMP Latest Ref Rng & Units 01/27/2022 01/26/2022 12/21/2021  Glucose 70 - 99 mg/dL 78 107(H) 134(H)  BUN 6 - 20 mg/dL 11 16 15   Creatinine 0.44 - 1.00 mg/dL 0.84 0.86 0.96  BUN/Creat Ratio 9 - 23 - - -  Sodium 135 - 145 mmol/L 138 138 134(L)  Potassium 3.5 - 5.1 mmol/L 3.3(L) 3.6 3.6  Chloride 98 - 111 mmol/L 105 99 96(L)  CO2 22 - 32 mmol/L 28 29 29   Calcium 8.9 - 10.3 mg/dL 8.5(L) 9.4  9.7   Imaging Studies  MR BRAIN WO CONTRAST  Result Date: 01/26/2022 CLINICAL DATA:  Provided history: Left-sided weakness. Possible stroke. EXAM: MRI HEAD WITHOUT CONTRAST TECHNIQUE: Multiplanar, multiecho pulse sequences of the brain and surrounding structures were obtained without intravenous contrast. COMPARISON:  Noncontrast head CT and CT angiogram head/neck performed earlier today 01/26/2022. brain MRI 11/23/2021. FINDINGS: Brain: Mild intermittent motion degradation. Cerebral volume appears normal for age. Redemonstrated chronic cortical/subcortical infarcts within the right parietal and occipital lobes, with associated cortical laminar necrosis and possible chronic blood products at these sites. Multifocal T2 FLAIR hyperintense signal abnormality elsewhere within the cerebral white matter, nonspecific but compatible with age advanced chronic small vessel ischemic disease. Redemonstrated chronic lacunar infarcts within the left basal ganglia and left thalamus. Unchanged chronic infarct within the right pons. As before, there is background extensive T2 FLAIR hyperintense signal abnormality within the pons and bilateral middle cerebellar peduncles, also likely reflecting chronic small vessel ischemia. There are a few scattered punctate supratentorial chronic parenchymal microhemorrhages. There is no acute infarct. No evidence of an intracranial mass. No extra-axial fluid collection. No midline shift. Vascular: Intracranial arterial vasculature more fully characterized on the same-day CT angiogram head/neck. Skull and upper cervical spine: No focal suspicious marrow lesion. Sinuses/Orbits: Visualized orbits show no acute finding. Mild mucosal thickening within the bilateral ethmoid sinuses. IMPRESSION: No evidence of acute intracranial abnormality. Stable non-contrast MRI appearance of the brain as compared to 11/23/2021. Chronic cortical/subcortical right PCA territory infarcts within the right parietal  and occipital lobes. Age-advanced chronic small vessel ischemic changes within the cerebral white matter. Chronic lacunar infarcts within the left basal ganglia and left thalamus. Chronic infarct within the right pons. Background advanced chronic small vessel ischemic changes within the pons and bilateral middle cerebellar peduncles. Mild mucosal thickening within the bilateral ethmoid sinuses. Electronically Signed   By: Kellie Simmering D.O.   On: 01/26/2022 18:44   DG Chest Portable 1 View  Result Date: 01/26/2022 CLINICAL DATA:  hypoxic, eval infiltrate EXAM: PORTABLE CHEST 1 VIEW COMPARISON:  CTA from the same day. Chest radiograph November 21, 2021. FINDINGS: Low lung volumes. Left basilar opacities. No visible pleural effusions or pneumothorax. Enlarged cardiac silhouette, accentuated by low lung volumes and AP technique. CABG and median sternotomy. Superior most two median sternotomy wires are fractured. IMPRESSION: 1. Left basilar opacities, concerning for pneumonia. 2. Cardiomegaly. Electronically Signed  By: Margaretha Sheffield M.D.   On: 01/26/2022 13:54   CT HEAD CODE STROKE WO CONTRAST  Result Date: 01/26/2022 CLINICAL DATA:  Code stroke.  Left arm weakness and facial droop EXAM: CT HEAD WITHOUT CONTRAST TECHNIQUE: Contiguous axial images were obtained from the base of the skull through the vertex without intravenous contrast. RADIATION DOSE REDUCTION: This exam was performed according to the departmental dose-optimization program which includes automated exposure control, adjustment of the mA and/or kV according to patient size and/or use of iterative reconstruction technique. COMPARISON:  Brain MRI 11/23/2021, CT head 09/08/2021 FINDINGS: Brain: There is no evidence of acute intracranial hemorrhage, extra-axial fluid collection, or acute infarct. Background parenchymal volume is normal. The ventricles are normal in size. Remote infarcts in the right parietal and occipital lobes are again noted with  hyperdensity overlying the right occipital infarct consistent with cortical laminar necrosis. A remote infarct in the right pons is also again seen. There is no mass lesion.  There is no midline shift. Vascular: No dense vessel is seen. There is calcification of the bilateral cavernous ICAs. Skull: Normal. Negative for fracture or focal lesion. Sinuses/Orbits: Imaged paranasal sinuses are clear. The globes and orbits are unremarkable. Other: None. ASPECTS [facility] Stroke Program Early CT Score) - Ganglionic level infarction (caudate, lentiform nuclei, internal capsule, insula, M1-M3 cortex): 7 - Supraganglionic infarction (M4-M6 cortex): 3 Total score (0-10 with 10 being normal): 10 IMPRESSION: 1. No acute intracranial hemorrhage or infarct. 2. ASPECTS is 10 3. Remote infarcts as above. These results were called by telephone at the time of interpretation on 01/26/2022 at 1:02 pm to provider Dr Quinn Axe, who verbally acknowledged these results. Electronically Signed   By: Valetta Mole M.D.   On: 01/26/2022 13:05   CT ANGIO HEAD NECK W WO CM W PERF (CODE STROKE)  Result Date: 01/26/2022 CLINICAL DATA:  Left facial droop and arm weakness EXAM: CT ANGIOGRAPHY HEAD AND NECK CT PERFUSION BRAIN TECHNIQUE: Multidetector CT imaging of the head and neck was performed using the standard protocol during bolus administration of intravenous contrast. Multiplanar CT image reconstructions and MIPs were obtained to evaluate the vascular anatomy. Carotid stenosis measurements (when applicable) are obtained utilizing NASCET criteria, using the distal internal carotid diameter as the denominator. Multiphase CT imaging of the brain was performed following IV bolus contrast injection. Subsequent parametric perfusion maps were calculated using RAPID software. RADIATION DOSE REDUCTION: This exam was performed according to the departmental dose-optimization program which includes automated exposure control, adjustment of the mA and/or kV  according to patient size and/or use of iterative reconstruction technique. CONTRAST:  170mL OMNIPAQUE IOHEXOL 350 MG/ML SOLN COMPARISON:  None. Same-day noncontrast head CT, brain MRI 11/23/2021, CTA head/neck 06/24/2021 FINDINGS: CTA NECK FINDINGS Aortic arch: There is calcified atherosclerotic plaque of the aortic arch. The origins of the major branch vessels are patent. Subclavian arteries are patent. There is mixed plaque at the origin of the right brachiocephalic artery without hemodynamically significant stenosis. Right carotid system: There is soft plaque throughout the right common carotid artery resulting in up to approximately 30% stenosis. There is primarily soft plaque in the right carotid bulb resulting in up to approximally 20% stenosis. The distal right internal carotid artery is patent. The right external carotid artery is occluded at its origin for a short-segment with distal reconstitution, unchanged. There is no dissection or aneurysm. Left carotid system: There is scattered soft and calcified plaque in the left common carotid artery without hemodynamically significant stenosis or occlusion. There  is mixed plaque in the proximal left common carotid artery resulting in up to approximately 30% stenosis. The distal left internal carotid artery is patent. The left external carotid artery is patent. There is no evidence of dissection or aneurysm. Vertebral arteries: The right vertebral artery is patent in the neck. The left vertebral artery is occluded from its origin through the mid V2 segment, with reconstitution of flow at the C3-C4 level. This is unchanged compared to the prior study from 06/24/2021. The remainder of the left vertebral artery is patent in the neck. Skeleton: There is no acute osseous abnormality or aggressive osseous lesion. There is no visible canal hematoma. Other neck: Soft tissues are unremarkable. Upper chest: There is patchy ground-glass opacity in the superior segment of  the left lower lobe, incompletely imaged. Review of the MIP images confirms the above findings CTA HEAD FINDINGS Anterior circulation: There is calcified atherosclerotic plaque in the bilateral intracranial ICAs resulting in up to moderate stenosis bilaterally. The bilateral MCAs are patent with mild atherosclerotic irregularity. There is no proximal high-grade stenosis or occlusion. The bilateral ACAs are patent with mild atherosclerotic irregularity. There is no proximal high-grade stenosis or occlusion. There is no aneurysm or AVM. Posterior circulation: The right V4 segment is patent but with multifocal atherosclerotic irregularity resulting in up to mild-to-moderate stenosis (7-239). PICA is identified on the right. There is mixed plaque in the proximal left V4 segment resulting in moderate to severe stenosis. The left vertebral artery is not identified beyond the PICA origin, unchanged. There is multifocal irregularity and narrowing of the basilar artery with up to moderate stenosis proximally (8-108). The basilar artery remains patent to its tip. The superior cerebellar arteries are patent. There is a fetal origin of the bilateral PCAs. There is irregularity of the bilateral P1 segments resulting in up to mild-to-moderate stenosis on the left (8-90). Otherwise, the PCAs are patent with mild multifocal irregularity and narrowing. There is no aneurysm or AVM. Venous sinuses: Patent. Anatomic variants: None. Review of the MIP images confirms the above findings CT Brain Perfusion Findings: ASPECTS: 10 CBF (<30%) Volume: 78mL Perfusion (Tmax>6.0s) volume: 58mL Mismatch Volume: 17mL Infarction Location:No infarct identified. The area of ischemia is in the left anterior temporal lobe. IMPRESSION: 1. No infarct core identified. 5 cc of ischemia was identified in the left anterior temporal lobe which may be artifactual. Recommend correlation with MRI. 2. No emergent large vessel occlusion. 3. Atherosclerotic plaque in  the bilateral carotid systems resulting in up to 30% stenosis in the right common carotid artery and 20% stenosis in the right internal carotid artery, and 30% stenosis of the left internal carotid artery. Findings are not significantly changed. 4. 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 There is significant atherosclerotic disease in the intracranial left V4 segment resulting in up to moderate to severe stenosis, and the V4 segment is not identified beyond the PICA origin, unchanged. The right vertebral artery is patent with up to mild-to-moderate stenosis. 5. Moderate stenosis of the bilateral intracranial ICAs and proximal basilar artery. No proximal high-grade stenosis or occlusion in the intracranial vasculature. 6. Unchanged occlusion of the right proximal external carotid artery with distal reconstitution. 7. Patchy ground-glass opacity in the left lower lobe may be infectious or inflammatory in nature, incompletely evaluated. Consider dedicated imaging of the chest as indicated. These results were paged to Dr. Quinn Axe via Amion at 1:43 p.m. Electronically Signed   By: Court Joy.D.  On: 01/26/2022 13:46    Medications   Scheduled Meds:  aspirin  81 mg Oral Q breakfast   benztropine  0.5 mg Oral QHS   chlorproMAZINE  100 mg Oral QHS   clopidogrel  75 mg Oral Daily   escitalopram  20 mg Oral Daily   ezetimibe  10 mg Oral Daily   FLUoxetine  60 mg Oral Daily   icosapent Ethyl  2 g Oral BID   insulin aspart  0-15 Units Subcutaneous TID WC   insulin glargine-yfgn  15 Units Subcutaneous QHS   lamoTRIgine  200 mg Oral QHS   pantoprazole (PROTONIX) IV  40 mg Intravenous Q24H   pregabalin  225 mg Oral BID   rosuvastatin  20 mg Oral Daily   sodium chloride flush  3 mL Intravenous Q12H   No recently discontinued medications to reconcile  LOS: 1 day   Richarda Osmond, DO [facility] 01/27/2022, 7:39 AM   Available by Epic  secure chat 7AM-7PM. If 7PM-7AM, please contact night-coverage Refer to [email or web address] to contact the [facility] Attending or Consulting provider for this pt

## 2022-01-27 NOTE — Progress Notes (Signed)
*  PRELIMINARY RESULTS* Echocardiogram 2D Echocardiogram has been performed. Definity IV Contrast used on this study.  Claretta Fraise 01/27/2022, 12:42 PM

## 2022-01-27 NOTE — Progress Notes (Signed)
ANTICOAGULATION CONSULT NOTE -   Pharmacy Consult for Heparin drip Indication: chest pain/ACS  Allergies  Allergen Reactions   Chantix [Varenicline Tartrate] Other (See Comments)    Feels "crazy" and angry     Patient Measurements: Height: 5\' 8"  (172.7 cm) Weight: 98.9 kg (218 lb 0.6 oz) IBW/kg (Calculated) : 63.9 Heparin Dosing Weight: 86.7 kg  Vital Signs: Temp: 98 F (36.7 C) (02/04 1147) Temp Source: Axillary (02/04 0801) BP: 111/53 (02/04 1147) Pulse Rate: 64 (02/04 1147)  Labs: Recent Labs    01/26/22 1349 01/26/22 1733 01/26/22 2111 01/27/22 0441 01/27/22 0549 01/27/22 1509  HGB 11.8*  --   --  10.3*  --   --   HCT 38.0  --   --  33.1*  --   --   PLT 295  --   --  249  --   --   APTT 35  --   --   --   --   --   LABPROT 15.0  --   --   --   --   --   INR 1.2  --   --   --   --   --   HEPARINUNFRC  --   --   --   --  <0.10* 0.25*  CREATININE 0.86  --   --  0.84  --   --   TROPONINIHS 2,535* 2,613* 2,196*  --   --   --      Estimated Creatinine Clearance: 100.7 mL/min (by C-G formula based on SCr of 0.84 mg/dL).   Medical History: Past Medical History:  Diagnosis Date   Arthralgia of temporomandibular joint    CAD, multiple vessel    a. 06/2016 Cath: ostLM 40%, ostLAD 40%, pLAD 95%, ost-pLCx 60%, pLCx 95%, mLCx 60%, mRCA 95%, D2 50%, LVSF nl;  b. 07/2016 CABG x 4 (LIMA->LAD, VG->Diag, VG->OM, VG->RCA); c. 08/2016 Cath: 3VD w/ 4/4 patent grafts. LAD distal to LIMA has diff dzs->Med rx; d. 08/2020 Cath: 4/4 patent grafts, native 3VD. EF 55-65%-->Med Rx.   Carotid arterial disease ([place])    a. 07/2016 s/p R CEA; b. 02/2021 U/S: RICA 20-25%, LICA 4-27%.   Clotting disorder ([place])    Depression    Diastolic dysfunction    a. 06/2016 Echo: EF 50-55%, mild inf wall HK, GR1DD, mild MR, RV sys fxn nl, mildly dilated LA, PASP nl; b. 06/2021 Echo: EF 60-65%, no rwma. Nl RV fxn.   Fatty liver disease, nonalcoholic 0623   History of blood transfusion    with heart surgery    HLD (hyperlipidemia)    Labile hypertension    a. prior renal ngiogram negative for RAS in 03/2016; b. catecholamines and metanephrines normal, mildly elevated renin with normal aldosterone and normal ratio in 02/2016   Myocardial infarction [facility]) 2017   Obesity    PAD (peripheral artery disease) ([place])    a. 09/2018 s/p L SFA stenting; b. 07/2019 Periph Angio: Patent m/d L SFA stent w/ 100% L SFA distal to stent. L AT 100d, L Peroneal diff dzs-->Med Rx; c. 02/2021 ABIs: stable @ 0.61 on R and 0.46 on L.   PTSD (post-traumatic stress disorder)    Tobacco abuse    Type 2 diabetes mellitus ([place]) 12/2015    Medications:  Scheduled:   aspirin  81 mg Oral Q breakfast   chlorproMAZINE  75 mg Oral QHS   clonazepam  0.25 mg Oral BID   clopidogrel  75 mg Oral Daily   [  START ON 01/28/2022] escitalopram  20 mg Oral Daily   ezetimibe  10 mg Oral Daily   icosapent Ethyl  2 g Oral BID   lamoTRIgine  200 mg Oral QHS   pregabalin  200 mg Oral BID   rosuvastatin  20 mg Oral Daily   sodium chloride flush  3 mL Intravenous Q12H   Infusions:   heparin 1,350 Units/hr (01/27/22 1005)    Assessment: 51 yo F to start Heparin drip for ACS/STEMI. Hx CABGx4 Hgb 11.8  plt 295  INR 1.2  aPTT 35 On ASA/plavix PTA per Med Rec.  -  Heparin drip and bolus were ordered on 2/3 around 1730 but not started due to concern for possible CVA. - Neuro ruled out CVA and MD ok'd to start heparin.  - MD requests using smaller heparin bolus of 2000 units (unsure as to why)    2/4 0549 HL= <0.10   subthera, bolus and inc. To 1350 u/hr 2/4 1509 HL=0.25     subthera, bolus, inc to 1450 u/hr   Goal of Therapy:  Heparin level 0.3-0.7 units/ml Monitor platelets by anticoagulation protocol: Yes   Plan:   2/4 1509 HL=0.25  subtherapeutic, will order bolus 1200 units and increase Drip to 1450 units/hr Will check HL 6 hrs after rate change. Cardiology note indicates continue Heparin x 48 hrs (started 2/3 @2343 ) F/u CBC  daily  Clarivel Callaway A 01/27/2022,3:44 PM

## 2022-01-27 NOTE — Progress Notes (Signed)
SLP Cancellation Note  Patient Details Name: Erika [name] MRN: [id number] DOB: July 14, 1971   Cancelled treatment:       Reason Eval/Treat Not Completed: SLP screened, no needs identified, will sign off (chart reviewed; consulted NSG then met w/ pt in room) Pt denied any difficulty swallowing and is currently on a regular diet; tolerates swallowing pills w/ water per NSG. She stated she ate her spaghetti at [facility] w/out difficulty. She is drinking water via straw in the bed while in the room w/out difficulty noted. Pt conversed in conversation w/out gross/overt expressive/receptive deficits noted; pt denied any speech-language deficits currently but stated she could not remember what she wanted to say "sometimes' -- pt is also on multiple sedating medications. Per MD note, "limit sedative medications. Patient was seen to have significant sedation when given her home medications  Suspect involvement of medication side effects with presenting symptoms as they have been intermittent.". Speech clear during this visit/screening. No further skilled ST services indicated as pt appears close to/at her baseline. Recommend general aspiration precautions including Sitting Fully Upright w/ all po's/meals. Pt agreed. NSG updated and will reconsult if any change in status while admitted.        Erika Kenner, MS, CCC-SLP Speech Language Pathologist Rehab Services; [place] [phone] (ascom) Copeland Lapier 01/27/2022, 2:42 PM

## 2022-01-27 NOTE — Progress Notes (Signed)
ANTICOAGULATION CONSULT NOTE -   Pharmacy Consult for Heparin drip Indication: chest pain/ACS  Allergies  Allergen Reactions   Chantix [Varenicline Tartrate] Other (See Comments)    Feels "crazy" and angry     Patient Measurements: Height: 5\' 8"  (172.7 cm) Weight: 98.9 kg (218 lb 0.6 oz) IBW/kg (Calculated) : 63.9 Heparin Dosing Weight: 86.7 kg  Vital Signs: Temp: 98.8 F (37.1 C) (02/04 2149) Temp Source: Oral (02/04 1900) BP: 170/71 (02/04 2149) Pulse Rate: 87 (02/04 2149)  Labs: Recent Labs    01/26/22 1349 01/26/22 1733 01/26/22 2111 01/27/22 0441 01/27/22 0549 01/27/22 1509 01/27/22 2038  HGB 11.8*  --   --  10.3*  --   --   --   HCT 38.0  --   --  33.1*  --   --   --   PLT 295  --   --  249  --   --   --   APTT 35  --   --   --   --   --   --   LABPROT 15.0  --   --   --   --   --   --   INR 1.2  --   --   --   --   --   --   HEPARINUNFRC  --   --   --   --  <0.10* 0.25* 0.36  CREATININE 0.86  --   --  0.84  --   --   --   TROPONINIHS 2,535* 2,613* 2,196*  --   --   --   --      Estimated Creatinine Clearance: 100.7 mL/min (by C-G formula based on SCr of 0.84 mg/dL).   Medical History: Past Medical History:  Diagnosis Date   Arthralgia of temporomandibular joint    CAD, multiple vessel    a. 06/2016 Cath: ostLM 40%, ostLAD 40%, pLAD 95%, ost-pLCx 60%, pLCx 95%, mLCx 60%, mRCA 95%, D2 50%, LVSF nl;  b. 07/2016 CABG x 4 (LIMA->LAD, VG->Diag, VG->OM, VG->RCA); c. 08/2016 Cath: 3VD w/ 4/4 patent grafts. LAD distal to LIMA has diff dzs->Med rx; d. 08/2020 Cath: 4/4 patent grafts, native 3VD. EF 55-65%-->Med Rx.   Carotid arterial disease ([place])    a. 07/2016 s/p R CEA; b. 02/2021 U/S: RICA 66-59%, LICA 9-35%.   Clotting disorder ([place])    Depression    Diastolic dysfunction    a. 06/2016 Echo: EF 50-55%, mild inf wall HK, GR1DD, mild MR, RV sys fxn nl, mildly dilated LA, PASP nl; b. 06/2021 Echo: EF 60-65%, no rwma. Nl RV fxn.   Fatty liver disease, nonalcoholic  7017   History of blood transfusion    with heart surgery   HLD (hyperlipidemia)    Labile hypertension    a. prior renal ngiogram negative for RAS in 03/2016; b. catecholamines and metanephrines normal, mildly elevated renin with normal aldosterone and normal ratio in 02/2016   Myocardial infarction [facility]) 2017   Obesity    PAD (peripheral artery disease) ([place])    a. 09/2018 s/p L SFA stenting; b. 07/2019 Periph Angio: Patent m/d L SFA stent w/ 100% L SFA distal to stent. L AT 100d, L Peroneal diff dzs-->Med Rx; c. 02/2021 ABIs: stable @ 0.61 on R and 0.46 on L.   PTSD (post-traumatic stress disorder)    Tobacco abuse    Type 2 diabetes mellitus ([place]) 12/2015    Medications:  Scheduled:   aspirin  81 mg  Oral Q breakfast   chlorproMAZINE  75 mg Oral QHS   clonazepam  0.25 mg Oral BID   clopidogrel  75 mg Oral Daily   [START ON 01/28/2022] escitalopram  20 mg Oral Daily   ezetimibe  10 mg Oral Daily   icosapent Ethyl  2 g Oral BID   lamoTRIgine  200 mg Oral QHS   pregabalin  200 mg Oral BID   rosuvastatin  20 mg Oral Daily   sodium chloride flush  3 mL Intravenous Q12H   Infusions:   heparin 1,450 Units/hr (01/27/22 1811)    Assessment: 51 yo F to start Heparin drip for ACS/STEMI. Hx CABGx4 Hgb 11.8  plt 295  INR 1.2  aPTT 35 On ASA/plavix PTA per Med Rec.  -  Heparin drip and bolus were ordered on 2/3 around 1730 but not started due to concern for possible CVA. - Neuro ruled out CVA and MD ok'd to start heparin.  - MD requests using smaller heparin bolus of 2000 units (unsure as to why)    2/4 0549 HL= <0.10   subthera, bolus and inc. To 1350 u/hr 2/4 1509 HL=0.25     subthera, bolus, inc to 1450 u/hr 2/4 2038 HL= 0.36  therapeutic x 1 @ 1450 units/hr   Goal of Therapy:  Heparin level 0.3-0.7 units/ml Monitor platelets by anticoagulation protocol: Yes   Plan:  Continue heparin drip at current rate if 1450 units/hr repeat HL in 6 hrs Cardiology note indicates continue  Heparin x 48 hrs (started 2/3 @2343 ) F/u CBC daily  Ashanty Coltrane Rodriguez-Guzman PharmD, BCPS 01/27/2022 10:25 PM

## 2022-01-28 DIAGNOSIS — G894 Chronic pain syndrome: Secondary | ICD-10-CM

## 2022-01-28 DIAGNOSIS — I6529 Occlusion and stenosis of unspecified carotid artery: Secondary | ICD-10-CM

## 2022-01-28 DIAGNOSIS — R079 Chest pain, unspecified: Secondary | ICD-10-CM

## 2022-01-28 DIAGNOSIS — R6520 Severe sepsis without septic shock: Secondary | ICD-10-CM

## 2022-01-28 DIAGNOSIS — Z7901 Long term (current) use of anticoagulants: Secondary | ICD-10-CM

## 2022-01-28 DIAGNOSIS — A419 Sepsis, unspecified organism: Secondary | ICD-10-CM

## 2022-01-28 DIAGNOSIS — I739 Peripheral vascular disease, unspecified: Secondary | ICD-10-CM

## 2022-01-28 DIAGNOSIS — I2510 Atherosclerotic heart disease of native coronary artery without angina pectoris: Secondary | ICD-10-CM

## 2022-01-28 DIAGNOSIS — Z79891 Long term (current) use of opiate analgesic: Secondary | ICD-10-CM

## 2022-01-28 LAB — CBC
HCT: 34.2 % — ABNORMAL LOW (ref 36.0–46.0)
Hemoglobin: 10.9 g/dL — ABNORMAL LOW (ref 12.0–15.0)
MCH: 28.2 pg (ref 26.0–34.0)
MCHC: 31.9 g/dL (ref 30.0–36.0)
MCV: 88.4 fL (ref 80.0–100.0)
Platelets: 269 10*3/uL (ref 150–400)
RBC: 3.87 MIL/uL (ref 3.87–5.11)
RDW: 15.5 % (ref 11.5–15.5)
WBC: 8.5 10*3/uL (ref 4.0–10.5)
nRBC: 0 % (ref 0.0–0.2)

## 2022-01-28 LAB — HEPARIN LEVEL (UNFRACTIONATED): Heparin Unfractionated: 0.35 IU/mL (ref 0.30–0.70)

## 2022-01-28 LAB — GLUCOSE, CAPILLARY
Glucose-Capillary: 110 mg/dL — ABNORMAL HIGH (ref 70–99)
Glucose-Capillary: 117 mg/dL — ABNORMAL HIGH (ref 70–99)
Glucose-Capillary: 139 mg/dL — ABNORMAL HIGH (ref 70–99)
Glucose-Capillary: 131 mg/dL — ABNORMAL HIGH (ref 70–99)

## 2022-01-28 LAB — PROCALCITONIN: Procalcitonin: 0.27 ng/mL

## 2022-01-28 MED ORDER — SODIUM CHLORIDE 0.9 % IV SOLN: 250.0000 mL | INTRAVENOUS | Status: DC | PRN

## 2022-01-28 MED ORDER — SODIUM CHLORIDE 0.9 % WEIGHT BASED INFUSION
1.0000 mL/kg/h | INTRAVENOUS | Status: DC
  Administered 2022-01-29: 1 mL/kg/h via INTRAVENOUS

## 2022-01-28 MED ORDER — SODIUM CHLORIDE 0.9% FLUSH: 3.0000 mL | INTRAVENOUS | Status: DC | PRN

## 2022-01-28 MED ORDER — SODIUM CHLORIDE 0.9 % WEIGHT BASED INFUSION
3.0000 mL/kg/h | INTRAVENOUS | Status: DC
  Administered 2022-01-29: 3 mL/kg/h via INTRAVENOUS

## 2022-01-28 NOTE — Evaluation (Signed)
Physical Therapy Evaluation Patient Details Name: Erika [name] MRN: [id number] DOB: 23-Oct-1971 Today's Date: 01/28/2022  History of Present Illness  Pt is a 51 y/o F with PMH: CVA with L sided hemiparesis of her L UE And partial paresis of her L LE, CAD s/p bypass, diastolic dysfunction, former smoker, depression, PAD and PTSD who presents to ED w/ increased L side weakness and slurred speech. Code Stroke called to care link. CT head showed no acute findings and no blood. MRI w/ No evidence of acute intracranial abnormality. Currently being treated for sepsis. Note: hospital stay 1 MA d/t AMS. Pt currently on room air.  Clinical Impression  Pt with the above diagnoses.  Pt's spouse present for PT evaluation. Pt known to PT (formerly seen in outpatient) and exhibits a general decrease in functional mobility compared to her PLOF. Pt required mod assist with bed mobility and up to min assist with STS. Pt with decreased postural stability and needed close CGA in standing due to initial unsteadiness. Pt required min a with brief ambulation with hemi-walker at side of bed due to decreased LE strength and unsteadiness. Pt requested rest break during intervention due to fatigue. Pt's SPO2% remained in low 90s and HR in 80s throughout evaluation. Pt denied any symptoms of SOB, dizziness or increased pain from baseline levels. Pt with history of impulsive behaviors and decreased safety awareness/decreased awareness of functional ability. Pt also with recent hx of 3 falls (no injuries) in a rehab facility in part due to impulsivity per spouse. While pt spouse and family is very supportive and has been able to assist pt in past, they cannot currently provide necessary level of assistance and supervision that pt needs at this time for safe functional mobility in her home. Pt also not at current PLOF to maintain safety in home environment. PT recommends SNF at this time. Pt would benefit from further skilled PT to  improve LE strength, balance, mobility and gait to return to PLOF.     Recommendations for follow up therapy are one component of a multi-disciplinary discharge planning process, led by the attending physician.  Recommendations may be updated based on patient status, additional functional criteria and insurance authorization.  Follow Up Recommendations Skilled nursing-short term rehab (<3 hours/day)    Assistance Recommended at Discharge Intermittent Supervision/Assistance  Patient can return home with the following  A lot of help with walking and/or transfers;A lot of help with bathing/dressing/bathroom;Assist for transportation;Help with stairs or ramp for entrance    Equipment Recommendations None recommended by PT  Recommendations for Other Services       Functional Status Assessment Patient has had a recent decline in their functional status and demonstrates the ability to make significant improvements in function in a reasonable and predictable amount of time.     Precautions / Restrictions Precautions Precautions: Fall Precaution Comments: Pt known to PT (seen prior in outpatient). Pt with decreased postural stability/balance. PT provided close CGA-min a with standing and ambulatory activities. Restrictions Weight Bearing Restrictions: No      Mobility  Bed Mobility Overal bed mobility: Needs Assistance Bed Mobility: Rolling, Sidelying to Sit, Sit to Supine Rolling: Mod assist Sidelying to sit: Mod assist Supine to sit: Mod assist Sit to supine: Mod assist   General bed mobility comments: Pt generally requires mod assist for bed mobility    Transfers Overall transfer level: Needs assistance Equipment used: Hemi-walker Transfers: Sit to/from Stand Sit to Stand: Min guard, Min assist, From elevated surface  General transfer comment: From elevated surface pt min guard, otherwise pt requires min assist, exhibits decreased postural stability requiring  minimum of close CGA with initial standing    Ambulation/Gait Ambulation/Gait assistance: Min assist Gait Distance (Feet): 12 Feet (performed at side of bed) Assistive device: Hemi-walker Gait Pattern/deviations: Step-to pattern, Decreased weight shift to left, Decreased step length - right, Decreased step length - left Gait velocity: decreased     General Gait Details: Pt reports fear of weightbearing through LLE. Pt slides feet, has difficulty correcting following cuing (likely impacted by FOF). Pt requires up to min a due to postural instability. Heavy RUE weightbearing through [employer]  Stairs            Wheelchair Mobility    Modified Rankin (Stroke Patients Only)       Balance                                             Pertinent Vitals/Pain Pain Assessment Pain Assessment: 0-10 Pain Score: 9  Pain Location: headache (pt and spouse reports chronic issue) Pain Intervention(s): Limited activity within patient's tolerance, Monitored during session, Repositioned    Home Living Family/patient expects to be discharged to:: Private residence Living Arrangements: Spouse/significant other;Children Available Help at Discharge: Family;Available PRN/intermittently (spouse works, daughter able to help almost all the time) Type of Home: House Home Access: Ramped entrance       Home Layout: One level Home Equipment: Tub bench;Wheelchair - manual;Cane - single point;Other (comment);Rolling Walker (2 wheels);Hospital bed Additional Comments: Pt uses hemiwalker    Prior Function Prior Level of Function : Needs assist;History of Falls (last six months);Other (comment) (pt recent hx of 3 falls per spouse, pt impulsive)       Physical Assist : Mobility (physical) Mobility (physical): Bed mobility;Transfers;Gait ADLs (physical): Bathing;Dressing Mobility Comments: household ambulator using Hemi-walker ADLs Comments: Pt requires Min A from family for ADL's  including bathing and dressing.     Hand Dominance   Dominant Hand: Right    Extremity/Trunk Assessment   Upper Extremity Assessment Upper Extremity Assessment: Defer to OT evaluation    Lower Extremity Assessment Lower Extremity Assessment: LLE deficits/detail RLE Deficits / Details: WFL, pt able to complete transfers with almost 100% weightbearing through RLE, pt able to perform heel slides, SLR through full ROM with RLE RLE Sensation: WNL LLE Deficits / Details: L LE was already her weaker side, but has recent hx of ankle fx requiring boot (~3MA) and endorses decreased strength since then. PF continues to be grossly 3+/5, DF 1/5.  Pt with 3-/5 knee flex/ext. PT fearful of weightbearing through LLE due to hx of fx LLE Sensation: WNL       Communication   Communication: No difficulties  Cognition Arousal/Alertness: Awake/alert Behavior During Therapy: WFL for tasks assessed/performed, Flat affect, Impulsive Overall Cognitive Status: Within Functional Limits for tasks assessed                                 General Comments: Pt spouse present and reports hx of impulsivity. Pt is also known to PT from previous outpatient treatment. Pt known to have general decrease in safety awareness.        General Comments      Exercises General Exercises - Lower Extremity Ankle Circles/Pumps: AROM, 10 reps, Supine,  AAROM (PT assists LLE through ankle pump ROM as strength impaired) Quad Sets: Left, 5 reps Heel Slides: AROM, AAROM, Both, 5 reps (PT assists LLE) Straight Leg Raises: AROM, Right, 10 reps Other Exercises Other Exercises: Pt performs 3 STS from hospital bed, requiring min a. Pt ambulates with forward/backward and lateral stepping at side of bed x 12 ft requiring rest break in between due to reported LE fatigue.   Assessment/Plan    PT Assessment Patient needs continued PT services  PT Problem List Decreased strength;Decreased activity tolerance;Decreased  balance;Decreased mobility;Decreased range of motion;Decreased coordination;Decreased safety awareness;Pain       PT Treatment Interventions Gait training;DME instruction;Therapeutic activities;Therapeutic exercise;Balance training;Neuromuscular re-education;Functional mobility training;Stair training;Patient/family education;Manual techniques    PT Goals (Current goals can be found in the Care Plan section)  Acute Rehab PT Goals Patient Stated Goal: Pt would like to return to her PLOF/improve walking and balance so she can go home PT Goal Formulation: With patient Time For Goal Achievement: 02/11/22 Potential to Achieve Goals: Fair    Frequency Min 2X/week     Co-evaluation               AM-PAC PT "6 Clicks" Mobility  Outcome Measure Help needed turning from your back to your side while in a flat bed without using bedrails?: A Lot Help needed moving from lying on your back to sitting on the side of a flat bed without using bedrails?: A Lot Help needed moving to and from a bed to a chair (including a wheelchair)?: A Lot Help needed standing up from a chair using your arms (e.g., wheelchair or bedside chair)?: A Little Help needed to walk in hospital room?: A Lot Help needed climbing 3-5 steps with a railing? : A Lot 6 Click Score: 13    End of Session Equipment Utilized During Treatment: Gait belt Activity Tolerance: Patient tolerated treatment well;Patient limited by fatigue Patient left: in bed;with call bell/phone within reach;with nursing/sitter in room;with family/visitor present Nurse Communication: Mobility status PT Visit Diagnosis: Unsteadiness on feet (R26.81);Other abnormalities of gait and mobility (R26.89);Muscle weakness (generalized) (M62.81);History of falling (Z91.81);Difficulty in walking, not elsewhere classified (R26.2);Hemiplegia and hemiparesis Hemiplegia - Right/Left: Left    Time: 6579-0383 PT Time Calculation (min) (ACUTE ONLY): 42  min   Charges:   PT Evaluation $PT Eval Moderate Complexity: 1 Mod PT Treatments $Gait Training: 8-22 mins        Ricard Dillon PT, DPT   Zollie Pee 01/28/2022, 10:41 AM

## 2022-01-28 NOTE — NC FL2 (Signed)
[place] LEVEL OF CARE SCREENING TOOL     IDENTIFICATION  Patient Name: Erika [name] Birthdate: 06/28/1971 Sex: female Admission Date (Current Location): 01/26/2022  [place] and [place] Number:  Engineering geologist and Address:  [facility], [address], [place], [place] [address]      Provider Number: [id number]  Attending Physician Name and Address:  Richarda Osmond, MD  Relative Name and Phone Number:  Catalina Antigua (spouse) [phone]    Current Level of Care: Hospital Recommended Level of Care: [place] Prior Approval Number:    Date Approved/Denied:   PASRR Number: [id number]  Discharge Plan: SNF    Current Diagnoses: Patient Active Problem List   Diagnosis Date Noted   Pressure injury of skin 01/27/2022   Cardiomegaly    Polypharmacy    Sepsis ([place]) 01/26/2022   SIRS (systemic inflammatory response syndrome) ([place]) 12/23/2021   COVID-19 virus infection 12/22/2021   Hyperglycemia 12/22/2021   Major depressive disorder, recurrent episode, mild ([place]) 11/26/2021   Ankle fracture, left    Closed avulsion fracture of medial malleolus of left tibia    CAP (community acquired pneumonia) 11/21/2021   Cerebrovascular accident (CVA) due to occlusion of vertebral artery ([place]) 09/11/2021   Cognitive change 09/11/2021   Cognitive communication deficit 08/10/2021   Urinary incontinence 08/10/2021   GERD (gastroesophageal reflux disease) 08/03/2021   Chronic post-traumatic stress disorder (PTSD)    Depression    Right pontine stroke ([place]) 06/28/2021   Hemiplegia and hemiparesis following cerebral infarction affecting left non-dominant side ([place]) 06/24/2021   Chronic diastolic CHF (congestive heart failure) ([place]) 06/24/2021   Fall 06/24/2021   Abnormal LFTs 06/24/2021   Acute respiratory failure with hypoxia ([place]) 06/24/2021   Olecranon bursitis of left elbow 06/12/2021   Chronic hip pain (Bilateral)  06/09/2021   Chronic use of opiate for therapeutic purpose 05/09/2021   Greater trochanteric bursitis of hip (Left) 03/09/2021   Bursitis of hip (Left) 96/28/3662   Uncomplicated opioid dependence ([place]) 02/20/2021   Acute conjunctivitis of left eye 01/02/2021   Unintentional weight loss 11/21/2020   Broken teeth (Right) 08/02/2020   History of MI (myocardial infarction) (July 2017) 08/02/2020   Neuropathy 06/09/2020   Dental abscess 06/09/2020   Foot drop (Left) 06/09/2020   Carotid stenosis, asymptomatic, right 04/27/2020   Chronic migraine 11/03/2019   Thrombocytosis 09/16/2019   Erythrocytosis 09/16/2019   Hypercalcemia 09/06/2019   Leukocytosis 09/06/2019   Polycythemia 09/06/2019   Chronic hip pain (Left) 08/27/2019   Osteoarthritis of hip (Left) 08/27/2019   Gluteal tendonitis of buttock (Left) 08/27/2019   Radial nerve palsy (Right) 07/15/2019   Neuropathy of radial nerve (Right) 06/30/2019   Abnormal bruising 06/25/2019   History of carotid endarterectomy (Right) 03/04/2019   Pain medication agreement signed 03/04/2019   Atypical facial pain (Right) 01/27/2019   Chronic ear pain (Right) 01/27/2019   Chronic jaw pain (Right) 01/27/2019   Geniculate Neuralgia (Right) 01/27/2019   Vitamin D deficiency 01/19/2019   Neurogenic pain 01/19/2019   Chronic anticoagulation (PLAVIX) 01/19/2019   Chronic pain syndrome 01/07/2019   Long term current use of opiate analgesic 01/07/2019   Long term prescription benzodiazepine use 01/07/2019   Pharmacologic therapy 01/07/2019   Disorder of skeletal system 01/07/2019   Problems influencing health status 01/07/2019   Chronic headaches (1ry area of Pain) (Right) 01/07/2019   Opiate use 09/24/2018   PVD (peripheral vascular disease) ([place]) 06/24/2018   Chronic ankle pain (Bilateral) 12/27/2017  Tendinopathy of gluteus medius (Right) 12/27/2017   Tendinopathy of gluteus medius (Left) 12/27/2017   Bilateral hip pain 12/26/2017    Chronic elbow pain (Left) 10/17/2017   Elevated troponin I level 05/21/2017   Major depressive disorder, recurrent episode, moderate (HCC) 11/19/2016   Insomnia 10/30/2016   Constipation 07/25/2016   S/P CABG x 4 07/06/2016   Bradycardia    CAD (coronary artery disease)    Carotid stenosis    CAD in native artery 06/29/2016   Elevated troponin 06/28/2016   Essential hypertension, malignant 06/28/2016   Mild tobacco abuse in early remission 06/28/2016   Essential hypertension    Malignant hypertension    Type 2 diabetes mellitus with other specified complication (HCC)    Chest pain with high risk for cardiac etiology 06/27/2016   NSTEMI (non-ST elevated myocardial infarction) ([place]) 06/27/2016   Proteinuria 03/15/2016   Renal artery stenosis (HCC) 03/15/2016   MDD (major depressive disorder) 10/17/2015   Agoraphobia with panic attacks 04/25/2015   HTN (hypertension), malignant 10/20/2013   Debility 04/02/2013   Cluster headache 03/20/2012   HLD (hyperlipidemia) 07/26/2010    Orientation RESPIRATION BLADDER Height & Weight     Self, Time, Situation, Place  Normal Incontinent, External catheter Weight: 218 lb 0.6 oz (98.9 kg) Height:  5\' 8"  (172.7 cm)  BEHAVIORAL SYMPTOMS/MOOD NEUROLOGICAL BOWEL NUTRITION STATUS      Continent Diet (see discharge summary)  AMBULATORY STATUS COMMUNICATION OF NEEDS Skin   Extensive Assist Verbally Other (Comment) (abrasion right buttocks, sacrum pressure injury stage 2)                       Personal Care Assistance Level of Assistance  Bathing, Feeding, Dressing, Total care Bathing Assistance: Limited assistance Feeding assistance: Independent Dressing Assistance: Limited assistance Total Care Assistance: Limited assistance   Functional Limitations Info  Hearing, Sight, Speech Sight Info: Adequate Hearing Info: Adequate Speech Info: Adequate    SPECIAL CARE FACTORS FREQUENCY  PT (By licensed PT), OT (By licensed OT)     PT  Frequency: min 4x weekly OT Frequency: min 4x weekly            Contractures Contractures Info: Not present    Additional Factors Info  Code Status, Allergies Code Status Info: DNR Allergies Info: chantix (varenicline tartrate)           Current Medications (01/28/2022):  This is the current hospital active medication list Current Facility-Administered Medications  Medication Dose Route Frequency Provider Last Rate Last Admin   acetaminophen (TYLENOL) tablet 650 mg  650 mg Oral Q6H PRN Clarnce Flock, MD       Or   acetaminophen (TYLENOL) suppository 650 mg  650 mg Rectal Q6H PRN Clarnce Flock, MD       aspirin chewable tablet 81 mg  81 mg Oral Q breakfast Clarnce Flock, MD   81 mg at 01/28/22 0945   chlorproMAZINE (THORAZINE) tablet 75 mg  75 mg Oral QHS Patrecia Pour, NP   75 mg at 01/27/22 2155   clonazePAM (KLONOPIN) disintegrating tablet 0.25 mg  0.25 mg Oral BID Patrecia Pour, NP   0.25 mg at 01/28/22 0945   clopidogrel (PLAVIX) tablet 75 mg  75 mg Oral Daily Clarnce Flock, MD   75 mg at 01/28/22 0945   escitalopram (LEXAPRO) tablet 20 mg  20 mg Oral Daily Patrecia Pour, NP   20 mg at 01/28/22 0945   ezetimibe (ZETIA) tablet  10 mg  10 mg Oral Daily Clarnce Flock, MD   10 mg at 01/28/22 0946   heparin ADULT infusion 100 units/mL (25000 units/237mL)  1,450 Units/hr Intravenous Continuous Noralee Space, RPH 14.5 mL/hr at 01/28/22 0948 1,450 Units/hr at 01/28/22 0948   icosapent Ethyl (VASCEPA) 1 g capsule 2 g  2 g Oral BID Clarnce Flock, MD   2 g at 01/28/22 0944   lamoTRIgine (LAMICTAL) tablet 200 mg  200 mg Oral QHS Clarnce Flock, MD   200 mg at 01/27/22 2155   ondansetron (ZOFRAN) tablet 4 mg  4 mg Oral Q6H PRN Clarnce Flock, MD       Or   ondansetron [facility] injection 4 mg  4 mg Intravenous Q6H PRN Clarnce Flock, MD       oxyCODONE (Oxy IR/ROXICODONE) immediate release tablet 2.5 mg  2.5 mg Oral Q4H PRN Richarda Osmond, MD   2.5 mg at 01/28/22 0945   polyethylene glycol (MIRALAX / GLYCOLAX) packet 17 g  17 g Oral Daily PRN Clarnce Flock, MD       pregabalin (LYRICA) capsule 200 mg  200 mg Oral BID Doristine Mango L, MD   200 mg at 01/28/22 0945   rosuvastatin (CRESTOR) tablet 20 mg  20 mg Oral Daily Clarnce Flock, MD   20 mg at 01/28/22 0945   sodium chloride flush (NS) 0.9 % injection 3 mL  3 mL Intravenous Q12H Clarnce Flock, MD   3 mL at 01/28/22 7342     Discharge Medications: Please see discharge summary for a list of discharge medications.  Relevant Imaging Results:  Relevant Lab Results:   Additional Information [id number]  Alberteen Sam, LCSW

## 2022-01-28 NOTE — H&P (View-Only) (Signed)
Progress Note  Patient Name: Erika [name] Date of Encounter: 01/28/2022  [facility] Cardiologist: [name], MD   Subjective   Patient more awake and alert today.  Able to carry out a full conversation.  Denies chest pain or shortness of breath.  States left lower extremity weakness is improving compared to admission.  Inpatient Medications    Scheduled Meds:  aspirin  81 mg Oral Q breakfast   chlorproMAZINE  75 mg Oral QHS   clonazepam  0.25 mg Oral BID   clopidogrel  75 mg Oral Daily   escitalopram  20 mg Oral Daily   ezetimibe  10 mg Oral Daily   icosapent Ethyl  2 g Oral BID   lamoTRIgine  200 mg Oral QHS   pregabalin  200 mg Oral BID   rosuvastatin  20 mg Oral Daily   sodium chloride flush  3 mL Intravenous Q12H   Continuous Infusions:  heparin 1,450 Units/hr (01/28/22 0948)   PRN Meds: acetaminophen **OR** acetaminophen, ondansetron **OR** ondansetron (ZOFRAN) IV, oxyCODONE, polyethylene glycol   Vital Signs    Vitals:   01/28/22 0008 01/28/22 0347 01/28/22 0849 01/28/22 1002  BP: 136/64 127/65 (!) 141/74   Pulse: 78 68 80 82  Resp: 18 18 18    Temp: 98.1 F (36.7 C)  97.9 F (36.6 C)   TempSrc:   Oral   SpO2: 93% 97% 99% 92%  Weight:      Height:        Intake/Output Summary (Last 24 hours) at 01/28/2022 1131 Last data filed at 01/28/2022 1043 Gross per 24 hour  Intake 533 ml  Output 1100 ml  Net -567 ml   Last 3 Weights 01/27/2022 01/26/2022 12/22/2021  Weight (lbs) 218 lb 0.6 oz 226 lb 208 lb 15.9 oz  Weight (kg) 98.9 kg 102.513 kg 94.8 kg  Some encounter information is confidential and restricted. Go to Review Flowsheets activity to see all data.      Telemetry    Sinus rhythm- Personally Reviewed  ECG     - Personally Reviewed  Physical Exam   GEN: No acute distress.   Neck: No JVD Cardiac: RRR, no murmurs, rubs, or gallops.  Respiratory: Clear to auscultation bilaterally. GI: Soft, nontender, non-distended  MS: No edema;  left arm and left leg weakness noted Neuro: Left arm, left leg weakness noted, slight left facial droop. Psych: Normal affect   Labs    High Sensitivity Troponin:   Recent Labs  Lab 01/26/22 1349 01/26/22 1733 01/26/22 2111  TROPONINIHS 2,535* 2,613* 2,196*     Chemistry Recent Labs  Lab 01/26/22 1349 01/27/22 0441  NA 138 138  K 3.6 3.3*  CL 99 105  CO2 29 28  GLUCOSE 107* 78  BUN 16 11  CREATININE 0.86 0.84  CALCIUM 9.4 8.5*  PROT 7.1 5.6*  ALBUMIN 3.4* 2.7*  AST 30 21  ALT 16 13  ALKPHOS 52 46  BILITOT 0.5 0.3  GFRNONAA >60 >60  ANIONGAP 10 5    Lipids No results for input(s): CHOL, TRIG, HDL, LABVLDL, LDLCALC, CHOLHDL in the last 168 hours.  Hematology Recent Labs  Lab 01/26/22 1349 01/27/22 0441 01/28/22 0319  WBC 18.1* 9.9 8.5  RBC 4.23 3.74* 3.87  HGB 11.8* 10.3* 10.9*  HCT 38.0 33.1* 34.2*  MCV 89.8 88.5 88.4  MCH 27.9 27.5 28.2  MCHC 31.1 31.1 31.9  RDW 15.9* 16.1* 15.5  PLT 295 249 269   Thyroid No results for input(s):  TSH, FREET4 in the last 168 hours.  BNP Recent Labs  Lab 01/26/22 1733  BNP 351.5*    DDimer No results for input(s): DDIMER in the last 168 hours.   Radiology    MR BRAIN WO CONTRAST  Result Date: 01/26/2022 CLINICAL DATA:  Provided history: Left-sided weakness. Possible stroke. EXAM: MRI HEAD WITHOUT CONTRAST TECHNIQUE: Multiplanar, multiecho pulse sequences of the brain and surrounding structures were obtained without intravenous contrast. COMPARISON:  Noncontrast head CT and CT angiogram head/neck performed earlier today 01/26/2022. brain MRI 11/23/2021. FINDINGS: Brain: Mild intermittent motion degradation. Cerebral volume appears normal for age. Redemonstrated chronic cortical/subcortical infarcts within the right parietal and occipital lobes, with associated cortical laminar necrosis and possible chronic blood products at these sites. Multifocal T2 FLAIR hyperintense signal abnormality elsewhere within the cerebral  white matter, nonspecific but compatible with age advanced chronic small vessel ischemic disease. Redemonstrated chronic lacunar infarcts within the left basal ganglia and left thalamus. Unchanged chronic infarct within the right pons. As before, there is background extensive T2 FLAIR hyperintense signal abnormality within the pons and bilateral middle cerebellar peduncles, also likely reflecting chronic small vessel ischemia. There are a few scattered punctate supratentorial chronic parenchymal microhemorrhages. There is no acute infarct. No evidence of an intracranial mass. No extra-axial fluid collection. No midline shift. Vascular: Intracranial arterial vasculature more fully characterized on the same-day CT angiogram head/neck. Skull and upper cervical spine: No focal suspicious marrow lesion. Sinuses/Orbits: Visualized orbits show no acute finding. Mild mucosal thickening within the bilateral ethmoid sinuses. IMPRESSION: No evidence of acute intracranial abnormality. Stable non-contrast MRI appearance of the brain as compared to 11/23/2021. Chronic cortical/subcortical right PCA territory infarcts within the right parietal and occipital lobes. Age-advanced chronic small vessel ischemic changes within the cerebral white matter. Chronic lacunar infarcts within the left basal ganglia and left thalamus. Chronic infarct within the right pons. Background advanced chronic small vessel ischemic changes within the pons and bilateral middle cerebellar peduncles. Mild mucosal thickening within the bilateral ethmoid sinuses. Electronically Signed   By: Kellie Simmering D.O.   On: 01/26/2022 18:44   Portable chest 1 View  Result Date: 01/27/2022 CLINICAL DATA:  51 year old female with recent COVID-19. Weakness. Prior CABG. EXAM: PORTABLE CHEST 1 VIEW COMPARISON:  Portable chest 01/26/2022 and earlier. FINDINGS: Portable AP upright view at 0813 hours. Improved lung volumes, with more normal cardiac size and mediastinal  contour now stable to that in December. Prior CABG. Visualized tracheal air column is within normal limits. Bilateral infrahilar streaky lung opacity is acute. No pneumothorax, pleural effusion, pulmonary edema or consolidation. Negative visible bowel gas. No acute osseous abnormality identified. IMPRESSION: Improved lung volumes. Bilateral streaky bilateral lower lung opacity could be atelectasis or COVID-19 pneumonia in this setting. No pleural effusion. Prior CABG, heart size within normal limits and stable since December. Electronically Signed   By: Genevie Ann M.D.   On: 01/27/2022 08:33   DG Chest Portable 1 View  Result Date: 01/26/2022 CLINICAL DATA:  hypoxic, eval infiltrate EXAM: PORTABLE CHEST 1 VIEW COMPARISON:  CTA from the same day. Chest radiograph November 21, 2021. FINDINGS: Low lung volumes. Left basilar opacities. No visible pleural effusions or pneumothorax. Enlarged cardiac silhouette, accentuated by low lung volumes and AP technique. CABG and median sternotomy. Superior most two median sternotomy wires are fractured. IMPRESSION: 1. Left basilar opacities, concerning for pneumonia. 2. Cardiomegaly. Electronically Signed   By: Margaretha Sheffield M.D.   On: 01/26/2022 13:54   ECHOCARDIOGRAM COMPLETE  Result Date:  01/27/2022    ECHOCARDIOGRAM REPORT   Patient Name:   UDELL BLASINGAME Date of Exam: 01/27/2022 Medical Rec #:  [id number]           Height:       68.0 in Accession #:    [id number]          Weight:       218.0 lb Date of Birth:  18-Dec-1971          BSA:          2.120 m Patient Age:    50 years            BP:           104/52 mmHg Patient Gender: F                   HR:           68 bpm. Exam Location:  [facility] Procedure: 2D Echo and Intracardiac Opacification Agent Indications:     CAD Native Vessel I25.10  History:         Patient has prior history of Echocardiogram examinations, most                  recent 06/25/2021.  Sonographer:     Kathlen Brunswick RDCS Referring Phys:  [id number]  Mosby M ECKSTAT Diagnosing Phys: Kate Sable MD  Sonographer Comments: Suboptimal apical window and suboptimal subcostal window. Image acquisition challenging due to patient body habitus. IMPRESSIONS  1. Left ventricular ejection fraction, by estimation, is 60 to 65%. The left ventricle has normal function. The left ventricle has no regional wall motion abnormalities. There is mild left ventricular hypertrophy. Left ventricular diastolic parameters are consistent with Grade II diastolic dysfunction (pseudonormalization).  2. Right ventricular systolic function is normal. The right ventricular size is normal.  3. The mitral valve is normal in structure. Mild mitral valve regurgitation.  4. The aortic valve is tricuspid. Aortic valve regurgitation is not visualized.  5. The inferior vena cava is dilated in size with <50% respiratory variability, suggesting right atrial pressure of 15 mmHg. FINDINGS  Left Ventricle: Left ventricular ejection fraction, by estimation, is 60 to 65%. The left ventricle has normal function. The left ventricle has no regional wall motion abnormalities. Definity contrast agent was given IV to delineate the left ventricular  endocardial borders. The left ventricular internal cavity size was normal in size. There is mild left ventricular hypertrophy. Left ventricular diastolic parameters are consistent with Grade II diastolic dysfunction (pseudonormalization). Right Ventricle: The right ventricular size is normal. No increase in right ventricular wall thickness. Right ventricular systolic function is normal. Left Atrium: Left atrial size was normal in size. Right Atrium: Right atrial size was normal in size. Pericardium: There is no evidence of pericardial effusion. Mitral Valve: The mitral valve is normal in structure. Mild mitral valve regurgitation. Tricuspid Valve: The tricuspid valve is normal in structure. Tricuspid valve regurgitation is trivial. Aortic Valve: The aortic valve is  tricuspid. Aortic valve regurgitation is not visualized. Aortic valve peak gradient measures 8.6 mmHg. Pulmonic Valve: The pulmonic valve was normal in structure. Pulmonic valve regurgitation is not visualized. Aorta: The aortic root is normal in size and structure. Venous: The inferior vena cava is dilated in size with less than 50% respiratory variability, suggesting right atrial pressure of 15 mmHg. IAS/Shunts: No atrial level shunt detected by color flow Doppler.  LEFT VENTRICLE PLAX 2D LVIDd:         4.80  cm     Diastology LVIDs:         3.20 cm     LV e' medial:    4.57 cm/s LV PW:         1.30 cm     LV E/e' medial:  22.8 LV IVS:        1.20 cm     LV e' lateral:   6.64 cm/s LVOT diam:     1.90 cm     LV E/e' lateral: 15.7 LV SV:         62 LV SV Index:   29 LVOT Area:     2.84 cm  LV Volumes (MOD) LV vol d, MOD A2C: 77.1 ml LV vol d, MOD A4C: 86.8 ml LV vol s, MOD A2C: 23.6 ml LV vol s, MOD A4C: 14.7 ml LV SV MOD A2C:     53.5 ml LV SV MOD A4C:     86.8 ml LV SV MOD BP:      63.0 ml RIGHT VENTRICLE RV Basal diam:  3.50 cm RV S prime:     11.00 cm/s TAPSE (M-mode): 1.8 cm LEFT ATRIUM             Index        RIGHT ATRIUM           Index LA diam:        3.30 cm 1.56 cm/m   RA Area:     12.60 cm LA Vol (A2C):   28.6 ml 13.49 ml/m  RA Volume:   31.40 ml  14.81 ml/m LA Vol (A4C):   49.3 ml 23.25 ml/m LA Biplane Vol: 39.9 ml 18.82 ml/m  AORTIC VALVE                 PULMONIC VALVE AV Area (Vmax): 2.30 cm     PV Vmax:       1.03 m/s AV Vmax:        147.00 cm/s  PV Peak grad:  4.2 mmHg AV Peak Grad:   8.6 mmHg LVOT Vmax:      119.00 cm/s LVOT Vmean:     78.500 cm/s LVOT VTI:       0.218 m  AORTA Ao Root diam: 2.90 cm MITRAL VALVE MV Area (PHT): 4.15 cm     SHUNTS MV Decel Time: 183 msec     Systemic VTI:  0.22 m MV E velocity: 104.00 cm/s  Systemic Diam: 1.90 cm MV A velocity: 63.00 cm/s MV E/A ratio:  1.65 Kate Sable MD Electronically signed by Kate Sable MD Signature Date/Time:  01/27/2022/1:04:59 PM    Final    CT HEAD CODE STROKE WO CONTRAST  Result Date: 01/26/2022 CLINICAL DATA:  Code stroke.  Left arm weakness and facial droop EXAM: CT HEAD WITHOUT CONTRAST TECHNIQUE: Contiguous axial images were obtained from the base of the skull through the vertex without intravenous contrast. RADIATION DOSE REDUCTION: This exam was performed according to the departmental dose-optimization program which includes automated exposure control, adjustment of the mA and/or kV according to patient size and/or use of iterative reconstruction technique. COMPARISON:  Brain MRI 11/23/2021, CT head 09/08/2021 FINDINGS: Brain: There is no evidence of acute intracranial hemorrhage, extra-axial fluid collection, or acute infarct. Background parenchymal volume is normal. The ventricles are normal in size. Remote infarcts in the right parietal and occipital lobes are again noted with hyperdensity overlying the right occipital infarct consistent with cortical laminar necrosis. A remote infarct in the right  pons is also again seen. There is no mass lesion.  There is no midline shift. Vascular: No dense vessel is seen. There is calcification of the bilateral cavernous ICAs. Skull: Normal. Negative for fracture or focal lesion. Sinuses/Orbits: Imaged paranasal sinuses are clear. The globes and orbits are unremarkable. Other: None. ASPECTS [facility] Stroke Program Early CT Score) - Ganglionic level infarction (caudate, lentiform nuclei, internal capsule, insula, M1-M3 cortex): 7 - Supraganglionic infarction (M4-M6 cortex): 3 Total score (0-10 with 10 being normal): 10 IMPRESSION: 1. No acute intracranial hemorrhage or infarct. 2. ASPECTS is 10 3. Remote infarcts as above. These results were called by telephone at the time of interpretation on 01/26/2022 at 1:02 pm to provider Dr Quinn Axe, who verbally acknowledged these results. Electronically Signed   By: Valetta Mole M.D.   On: 01/26/2022 13:05   CT ANGIO HEAD NECK W WO  CM W PERF (CODE STROKE)  Result Date: 01/26/2022 CLINICAL DATA:  Left facial droop and arm weakness EXAM: CT ANGIOGRAPHY HEAD AND NECK CT PERFUSION BRAIN TECHNIQUE: Multidetector CT imaging of the head and neck was performed using the standard protocol during bolus administration of intravenous contrast. Multiplanar CT image reconstructions and MIPs were obtained to evaluate the vascular anatomy. Carotid stenosis measurements (when applicable) are obtained utilizing NASCET criteria, using the distal internal carotid diameter as the denominator. Multiphase CT imaging of the brain was performed following IV bolus contrast injection. Subsequent parametric perfusion maps were calculated using RAPID software. RADIATION DOSE REDUCTION: This exam was performed according to the departmental dose-optimization program which includes automated exposure control, adjustment of the mA and/or kV according to patient size and/or use of iterative reconstruction technique. CONTRAST:  171mL OMNIPAQUE IOHEXOL 350 MG/ML SOLN COMPARISON:  None. Same-day noncontrast head CT, brain MRI 11/23/2021, CTA head/neck 06/24/2021 FINDINGS: CTA NECK FINDINGS Aortic arch: There is calcified atherosclerotic plaque of the aortic arch. The origins of the major branch vessels are patent. Subclavian arteries are patent. There is mixed plaque at the origin of the right brachiocephalic artery without hemodynamically significant stenosis. Right carotid system: There is soft plaque throughout the right common carotid artery resulting in up to approximately 30% stenosis. There is primarily soft plaque in the right carotid bulb resulting in up to approximally 20% stenosis. The distal right internal carotid artery is patent. The right external carotid artery is occluded at its origin for a short-segment with distal reconstitution, unchanged. There is no dissection or aneurysm. Left carotid system: There is scattered soft and calcified plaque in the left common  carotid artery without hemodynamically significant stenosis or occlusion. There is mixed plaque in the proximal left common carotid artery resulting in up to approximately 30% stenosis. The distal left internal carotid artery is patent. The left external carotid artery is patent. There is no evidence of dissection or aneurysm. Vertebral arteries: The right vertebral artery is patent in the neck. The left vertebral artery is occluded from its origin through the mid V2 segment, with reconstitution of flow at the C3-C4 level. This is unchanged compared to the prior study from 06/24/2021. The remainder of the left vertebral artery is patent in the neck. Skeleton: There is no acute osseous abnormality or aggressive osseous lesion. There is no visible canal hematoma. Other neck: Soft tissues are unremarkable. Upper chest: There is patchy ground-glass opacity in the superior segment of the left lower lobe, incompletely imaged. Review of the MIP images confirms the above findings CTA HEAD FINDINGS Anterior circulation: There is calcified atherosclerotic plaque in  the bilateral intracranial ICAs resulting in up to moderate stenosis bilaterally. The bilateral MCAs are patent with mild atherosclerotic irregularity. There is no proximal high-grade stenosis or occlusion. The bilateral ACAs are patent with mild atherosclerotic irregularity. There is no proximal high-grade stenosis or occlusion. There is no aneurysm or AVM. Posterior circulation: The right V4 segment is patent but with multifocal atherosclerotic irregularity resulting in up to mild-to-moderate stenosis (7-239). PICA is identified on the right. There is mixed plaque in the proximal left V4 segment resulting in moderate to severe stenosis. The left vertebral artery is not identified beyond the PICA origin, unchanged. There is multifocal irregularity and narrowing of the basilar artery with up to moderate stenosis proximally (8-108). The basilar artery remains patent  to its tip. The superior cerebellar arteries are patent. There is a fetal origin of the bilateral PCAs. There is irregularity of the bilateral P1 segments resulting in up to mild-to-moderate stenosis on the left (8-90). Otherwise, the PCAs are patent with mild multifocal irregularity and narrowing. There is no aneurysm or AVM. Venous sinuses: Patent. Anatomic variants: None. Review of the MIP images confirms the above findings CT Brain Perfusion Findings: ASPECTS: 10 CBF (<30%) Volume: 36mL Perfusion (Tmax>6.0s) volume: 60mL Mismatch Volume: 2mL Infarction Location:No infarct identified. The area of ischemia is in the left anterior temporal lobe. IMPRESSION: 1. No infarct core identified. 5 cc of ischemia was identified in the left anterior temporal lobe which may be artifactual. Recommend correlation with MRI. 2. No emergent large vessel occlusion. 3. Atherosclerotic plaque in the bilateral carotid systems resulting in up to 30% stenosis in the right common carotid artery and 20% stenosis in the right internal carotid artery, and 30% stenosis of the left internal carotid artery. Findings are not significantly changed. 4. 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 There is significant atherosclerotic disease in the intracranial left V4 segment resulting in up to moderate to severe stenosis, and the V4 segment is not identified beyond the PICA origin, unchanged. The right vertebral artery is patent with up to mild-to-moderate stenosis. 5. Moderate stenosis of the bilateral intracranial ICAs and proximal basilar artery. No proximal high-grade stenosis or occlusion in the intracranial vasculature. 6. Unchanged occlusion of the right proximal external carotid artery with distal reconstitution. 7. Patchy ground-glass opacity in the left lower lobe may be infectious or inflammatory in nature, incompletely evaluated. Consider dedicated imaging of the chest as indicated.  These results were paged to Dr. Quinn Axe via [employer] at 1:43 p.m. Electronically Signed   By: Valetta Mole M.D.   On: 01/26/2022 13:46    Cardiac Studies   TTE 01/27/2022 1. Left ventricular ejection fraction, by estimation, is 60 to 65%. The  left ventricle has normal function. The left ventricle has no regional  wall motion abnormalities. There is mild left ventricular hypertrophy.  Left ventricular diastolic parameters  are consistent with Grade II diastolic dysfunction (pseudonormalization).   2. Right ventricular systolic function is normal. The right ventricular  size is normal.   3. The mitral valve is normal in structure. Mild mitral valve  regurgitation.   4. The aortic valve is tricuspid. Aortic valve regurgitation is not  visualized.   5. The inferior vena cava is dilated in size with <50% respiratory  variability, suggesting right atrial pressure of 15 mmHg.   Patient Profile     51 y.o. female with history of CAD/CABG x 4 (2017), right CEA, PAD(left SFA stent), hypertension, hyperlipidemia,  former tobacco use presenting with left facial weakness/CVA, being seen for elevated troponins/NSTEMI.  Assessment & Plan    1.  NSTEMI, history of CABG x4 -Troponins peaked at 2613 -Continue heparin x48 hours (through midnight today) -Echo with preserved EF -Continue aspirin, Plavix, Zetia, Crestor. -Plan left heart cath tomorrow, keep n.p.o. midnight.   2.  Facial droop, somnolence, history of CVA -No acute findings on head CT, MRI -More coherent, left lower extremity weakness improving. -Management as per medicine team, neurology.     Total encounter time more than 50 minutes  Greater than 50% was spent in counseling and coordination of care with the patient    Shared Decision Making/Informed Consent The risks [stroke (1 in 1000), death (1 in 1000), kidney failure [usually temporary] (1 in 500), bleeding (1 in 200), allergic reaction [possibly serious] (1 in 200)], benefits  (diagnostic support and management of coronary artery disease) and alternatives of a cardiac catheterization were discussed in detail with Ms. Mell and she is willing to proceed.       Signed, Kate Sable, MD  01/28/2022, 11:31 AM

## 2022-01-28 NOTE — Progress Notes (Signed)
ANTICOAGULATION CONSULT NOTE -   Pharmacy Consult for Heparin drip Indication: chest pain/ACS  Allergies  Allergen Reactions   Chantix [Varenicline Tartrate] Other (See Comments)    Feels "crazy" and angry     Patient Measurements: Height: 5\' 8"  (172.7 cm) Weight: 98.9 kg (218 lb 0.6 oz) IBW/kg (Calculated) : 63.9 Heparin Dosing Weight: 86.7 kg  Vital Signs: Temp: 98.1 F (36.7 C) (02/05 0008) Temp Source: Oral (02/04 1900) BP: 127/65 (02/05 0347) Pulse Rate: 68 (02/05 0347)  Labs: Recent Labs    01/26/22 1349 01/26/22 1733 01/26/22 2111 01/27/22 0441 01/27/22 0549 01/27/22 1509 01/27/22 2038 01/28/22 0319  HGB 11.8*  --   --  10.3*  --   --   --  10.9*  HCT 38.0  --   --  33.1*  --   --   --  34.2*  PLT 295  --   --  249  --   --   --  269  APTT 35  --   --   --   --   --   --   --   LABPROT 15.0  --   --   --   --   --   --   --   INR 1.2  --   --   --   --   --   --   --   HEPARINUNFRC  --   --   --   --    < > 0.25* 0.36 0.35  CREATININE 0.86  --   --  0.84  --   --   --   --   TROPONINIHS 2,535* 2,613* 2,196*  --   --   --   --   --    < > = values in this interval not displayed.     Estimated Creatinine Clearance: 100.7 mL/min (by C-G formula based on SCr of 0.84 mg/dL).   Medical History: Past Medical History:  Diagnosis Date   Arthralgia of temporomandibular joint    CAD, multiple vessel    a. 06/2016 Cath: ostLM 40%, ostLAD 40%, pLAD 95%, ost-pLCx 60%, pLCx 95%, mLCx 60%, mRCA 95%, D2 50%, LVSF nl;  b. 07/2016 CABG x 4 (LIMA->LAD, VG->Diag, VG->OM, VG->RCA); c. 08/2016 Cath: 3VD w/ 4/4 patent grafts. LAD distal to LIMA has diff dzs->Med rx; d. 08/2020 Cath: 4/4 patent grafts, native 3VD. EF 55-65%-->Med Rx.   Carotid arterial disease ([place])    a. 07/2016 s/p R CEA; b. 02/2021 U/S: RICA 84-16%, LICA 6-06%.   Clotting disorder ([place])    Depression    Diastolic dysfunction    a. 06/2016 Echo: EF 50-55%, mild inf wall HK, GR1DD, mild MR, RV sys fxn nl,  mildly dilated LA, PASP nl; b. 06/2021 Echo: EF 60-65%, no rwma. Nl RV fxn.   Fatty liver disease, nonalcoholic 3016   History of blood transfusion    with heart surgery   HLD (hyperlipidemia)    Labile hypertension    a. prior renal ngiogram negative for RAS in 03/2016; b. catecholamines and metanephrines normal, mildly elevated renin with normal aldosterone and normal ratio in 02/2016   Myocardial infarction [facility]) 2017   Obesity    PAD (peripheral artery disease) ([place])    a. 09/2018 s/p L SFA stenting; b. 07/2019 Periph Angio: Patent m/d L SFA stent w/ 100% L SFA distal to stent. L AT 100d, L Peroneal diff dzs-->Med Rx; c. 02/2021 ABIs: stable @ 0.61 on R and  0.46 on L.   PTSD (post-traumatic stress disorder)    Tobacco abuse    Type 2 diabetes mellitus ([facility]) 12/2015    Medications:  Scheduled:   aspirin  81 mg Oral Q breakfast   chlorproMAZINE  75 mg Oral QHS   clonazepam  0.25 mg Oral BID   clopidogrel  75 mg Oral Daily   escitalopram  20 mg Oral Daily   ezetimibe  10 mg Oral Daily   icosapent Ethyl  2 g Oral BID   lamoTRIgine  200 mg Oral QHS   pregabalin  200 mg Oral BID   rosuvastatin  20 mg Oral Daily   sodium chloride flush  3 mL Intravenous Q12H   Infusions:   heparin 1,450 Units/hr (01/28/22 0035)    Assessment: 51 yo F to start Heparin drip for ACS/STEMI. Hx CABGx4 Hgb 11.8  plt 295  INR 1.2  aPTT 35 On ASA/plavix PTA per Med Rec.  -  Heparin drip and bolus were ordered on 2/3 around 1730 but not started due to concern for possible CVA. - Neuro ruled out CVA and MD ok'd to start heparin.  - MD requests using smaller heparin bolus of 2000 units (unsure as to why)    2/4 0549 HL= <0.10   subthera, bolus and inc. To 1350 u/hr 2/4 1509 HL=0.25     subthera, bolus, inc to 1450 u/hr 2/4 2038 HL= 0.36  therapeutic x 1 @ 1450 units/hr 2/5 0319 HL= 0.35  therapeutic X 2 @ 1450 units/hr   Goal of Therapy:  Heparin level 0.3-0.7 units/ml Monitor platelets by  anticoagulation protocol: Yes   Plan:  Continue heparin drip at current rate if 1450 units/hr Recheck HL on 2/6 @ 0500 Cardiology note indicates continue Heparin x 48 hrs (started 2/3 @2343 ) F/u CBC daily  Raquel Rodriguez-Guzman PharmD, BCPS 01/28/2022 4:49 AM

## 2022-01-28 NOTE — Progress Notes (Signed)
PROGRESS NOTE  Erika [name]    DOB: [birth year], 51 y.o.  WGY:[id number]  PCP: Lesleigh Noe, MD   Code Status: DNR   DOA: 01/26/2022   LOS: 2  Brief Narrative of Current Hospitalization  Erika [name] is a 51 y.o. female with a PMH significant for  severe vasculopathy with multiple complications including CAD status post CABG x4, CVA, carotid stenosis, renal artery stenosis, as well as diastolic CHF, depression/anxiety, chronic opiate and benzodiazepine use, essential hypertension, diabetes, hyperlipidemia, and chronic pain. They presented from home to the ED on 01/26/2022 with AMS. EMS was called and she was brought to ED for evaluation of suspected stroke. Her head CT/brain MRI were negative for acute stroke. Neurology was consulted for evaluation. In the ED, it was found that they had elevated troponins and ECG abnormalities concerning for NSTEMI. Cardiology was consulted to evaluate. They were treated with aspirin, heparin bolus + infusion, IV antibiotics, IV fluid support.  Patient was admitted to medicine service for further workup and management of NSTEMI, AMS as outlined in detail below.  01/28/22 -stable  Assessment & Plan  Principal Problem:   Sepsis ([place]) Active Problems:   HLD (hyperlipidemia)   Cluster headache   MDD (major depressive disorder)   Renal artery stenosis (HCC)   Chest pain with high risk for cardiac etiology   NSTEMI (non-ST elevated myocardial infarction) ([place])   Essential hypertension, malignant   Type 2 diabetes mellitus with other specified complication ([place])   CAD in native artery   CAD (coronary artery disease)   Carotid stenosis   S/P CABG x 4   PVD (peripheral vascular disease) (HCC)   Chronic pain syndrome   Long term current use of opiate analgesic   Long term prescription benzodiazepine use   Chronic anticoagulation (PLAVIX)   Chronic diastolic CHF (congestive heart failure) (HCC)   Cerebrovascular accident (CVA) due to  occlusion of vertebral artery (HCC)   Major depressive disorder, recurrent episode, mild (HCC)   Pressure injury of skin   Cardiomegaly   Polypharmacy  Severe sepsis- charted by admitting provider but she does not meet criteria as she had normal BP, HR, temperature, LA.  Possible CAP- O2 requirement on admission up to maximum of 2L. Currently on room air without respiratory distress. Chest xray findings are consistent with post-COVID pneumonia  - hold antibiotics and continue to monitor respiratory status closely  Delirium   polypharmacy   CVA rule out- multifactorial as described below. Patient states she has resolution of the abnormal symptoms. Family states she is more alert today. Dose adjustments have been made to help limit sedative effects. Suspect involvement of medication side effects with presenting symptoms as they have been intermittent and witnessed by staff after medication administration. She also has multiple antihypertensives but has had low blood pressures inpatient despite holding those medications so could have component of poor perfusion causing her presenting symptoms.  Head imaging thus far has been negative for acute CVA.  - limit sedative medications. - neurology signed off - PT/OT/SLP - CMP am - psych consulted to help guide psychiatric medication alterations    NSTEMI   CAD s/p CABG   h/o MI   Cardiomegaly - cardiology managing, appreciate care - cath 2/6 - continue heparin gtt >/=48 hours   Hx CVA - continue zetia, rosuvastatin   Anxiety/depression- chronic, stable - continue Thorazine, clonazepam, escitalopram, fluoxetine, Lamictal - decreased clonazepam and thorazine  Type II DM-most recent hgb A1c 11/22 was 7.0. blood  sugars have been borderline low while inpatient.  - hold insulin and monitor  HTN-given relative hypotension  - hold home carvedilol, Imdur, torsemide, spironolactone, potassium  GERD- - discontinue PPI   Neuropathy -continue  pregabalin  DVT prophylaxis: heparin gtt  Diet:  Diet Orders (From admission, onward)     Start     Ordered   01/26/22 2123  Diet heart healthy/carb modified Room service appropriate? Yes; Fluid consistency: Thin  Diet effective now       Question Answer Comment  Diet-HS Snack? Nothing   Room service appropriate? Yes   Fluid consistency: Thin      01/26/22 2122            Subjective 01/28/22    Pt reports feeling improved today. Respiratory concerns improved. No reoccurrence of the presenting symptoms.   Disposition Plan & Communication  Patient status: Inpatient  Admitted From: Home Disposition: Home Anticipated discharge date: TBD  Family Communication: husband at bedside  Consults, Procedures, Significant Events  Consultants:  Psych Neurology Cardiology   Procedures/significant events:  Brain MRI  Antimicrobials:  Anti-infectives (From admission, onward)    Start     Dose/Rate Route Frequency Ordered Stop   01/26/22 1445  cefTRIAXone (ROCEPHIN) 2 g in sodium chloride 0.9 % 100 mL IVPB  Status:  Discontinued        2 g 200 mL/hr over 30 Minutes Intravenous Every 24 hours 01/26/22 1439 01/27/22 1210   01/26/22 1445  azithromycin (ZITHROMAX) 500 mg in sodium chloride 0.9 % 250 mL IVPB  Status:  Discontinued        500 mg 250 mL/hr over 60 Minutes Intravenous Every 24 hours 01/26/22 1439 01/27/22 1210       Objective   Vitals:   01/27/22 1900 01/27/22 2149 01/28/22 0008 01/28/22 0347  BP: 128/71 (!) 170/71 136/64 127/65  Pulse: 75 87 78 68  Resp: 18 20 18 18   Temp: 98.1 F (36.7 C) 98.8 F (37.1 C) 98.1 F (36.7 C)   TempSrc: Oral     SpO2: 96% 95% 93% 97%  Weight:      Height:        Intake/Output Summary (Last 24 hours) at 01/28/2022 0757 Last data filed at 01/28/2022 0610 Gross per 24 hour  Intake 1616.43 ml  Output 1100 ml  Net 516.43 ml    Filed Weights   01/26/22 1328 01/27/22 0600  Weight: 102.5 kg 98.9 kg    Patient BMI: Body mass  index is 33.15 kg/m.   Physical Exam:  General: awake, alert, NAD HEENT: atraumatic, clear conjunctiva, anicteric sclera, MMM, hearing grossly normal Respiratory: normal respiratory effort. Cardiovascular: quick capillary refill  Nervous: A&O x3. no gross focal neurologic deficits, normal speech Extremities: moves all equally, no edema, normal tone Skin: dry, intact, normal temperature, normal color. No rashes, lesions or ulcers on exposed skin Psychiatry: normal mood, congruent affect  Labs   I have personally reviewed following labs and imaging studies CBC    Component Value Date/Time   WBC 8.5 01/28/2022 0319   RBC 3.87 01/28/2022 0319   HGB 10.9 (L) 01/28/2022 0319   HGB 14.2 09/11/2021 1413   HCT 34.2 (L) 01/28/2022 0319   HCT 43.5 09/11/2021 1413   PLT 269 01/28/2022 0319   PLT 433 09/11/2021 1413   MCV 88.4 01/28/2022 0319   MCV 89 09/11/2021 1413   MCV 89 01/11/2015 1022   MCH 28.2 01/28/2022 0319   MCHC 31.9 01/28/2022 0319   RDW  15.5 01/28/2022 0319   RDW 12.8 09/11/2021 1413   RDW 13.8 01/11/2015 1022   LYMPHSABS 2.7 01/26/2022 1349   LYMPHSABS 2.6 09/11/2021 1413   LYMPHSABS 3.4 01/11/2014 1312   MONOABS 0.6 01/26/2022 1349   MONOABS 0.7 01/11/2014 1312   EOSABS 0.1 01/26/2022 1349   EOSABS 0.2 09/11/2021 1413   EOSABS 0.3 01/11/2014 1312   BASOSABS 0.0 01/26/2022 1349   BASOSABS 0.0 09/11/2021 1413   BASOSABS 0.1 01/11/2014 1312   BMP Latest Ref Rng & Units 01/27/2022 01/26/2022 12/21/2021  Glucose 70 - 99 mg/dL 78 107(H) 134(H)  BUN 6 - 20 mg/dL 11 16 15   Creatinine 0.44 - 1.00 mg/dL 0.84 0.86 0.96  BUN/Creat Ratio 9 - 23 - - -  Sodium 135 - 145 mmol/L 138 138 134(L)  Potassium 3.5 - 5.1 mmol/L 3.3(L) 3.6 3.6  Chloride 98 - 111 mmol/L 105 99 96(L)  CO2 22 - 32 mmol/L 28 29 29   Calcium 8.9 - 10.3 mg/dL 8.5(L) 9.4 9.7   Imaging Studies  MR BRAIN WO CONTRAST  Result Date: 01/26/2022 CLINICAL DATA:  Provided history: Left-sided weakness. Possible  stroke. EXAM: MRI HEAD WITHOUT CONTRAST TECHNIQUE: Multiplanar, multiecho pulse sequences of the brain and surrounding structures were obtained without intravenous contrast. COMPARISON:  Noncontrast head CT and CT angiogram head/neck performed earlier today 01/26/2022. brain MRI 11/23/2021. FINDINGS: Brain: Mild intermittent motion degradation. Cerebral volume appears normal for age. Redemonstrated chronic cortical/subcortical infarcts within the right parietal and occipital lobes, with associated cortical laminar necrosis and possible chronic blood products at these sites. Multifocal T2 FLAIR hyperintense signal abnormality elsewhere within the cerebral white matter, nonspecific but compatible with age advanced chronic small vessel ischemic disease. Redemonstrated chronic lacunar infarcts within the left basal ganglia and left thalamus. Unchanged chronic infarct within the right pons. As before, there is background extensive T2 FLAIR hyperintense signal abnormality within the pons and bilateral middle cerebellar peduncles, also likely reflecting chronic small vessel ischemia. There are a few scattered punctate supratentorial chronic parenchymal microhemorrhages. There is no acute infarct. No evidence of an intracranial mass. No extra-axial fluid collection. No midline shift. Vascular: Intracranial arterial vasculature more fully characterized on the same-day CT angiogram head/neck. Skull and upper cervical spine: No focal suspicious marrow lesion. Sinuses/Orbits: Visualized orbits show no acute finding. Mild mucosal thickening within the bilateral ethmoid sinuses. IMPRESSION: No evidence of acute intracranial abnormality. Stable non-contrast MRI appearance of the brain as compared to 11/23/2021. Chronic cortical/subcortical right PCA territory infarcts within the right parietal and occipital lobes. Age-advanced chronic small vessel ischemic changes within the cerebral white matter. Chronic lacunar infarcts within  the left basal ganglia and left thalamus. Chronic infarct within the right pons. Background advanced chronic small vessel ischemic changes within the pons and bilateral middle cerebellar peduncles. Mild mucosal thickening within the bilateral ethmoid sinuses. Electronically Signed   By: Kellie Simmering D.O.   On: 01/26/2022 18:44   Portable chest 1 View  Result Date: 01/27/2022 CLINICAL DATA:  51 year old female with recent COVID-19. Weakness. Prior CABG. EXAM: PORTABLE CHEST 1 VIEW COMPARISON:  Portable chest 01/26/2022 and earlier. FINDINGS: Portable AP upright view at 0813 hours. Improved lung volumes, with more normal cardiac size and mediastinal contour now stable to that in December. Prior CABG. Visualized tracheal air column is within normal limits. Bilateral infrahilar streaky lung opacity is acute. No pneumothorax, pleural effusion, pulmonary edema or consolidation. Negative visible bowel gas. No acute osseous abnormality identified. IMPRESSION: Improved lung volumes. Bilateral streaky bilateral lower  lung opacity could be atelectasis or COVID-19 pneumonia in this setting. No pleural effusion. Prior CABG, heart size within normal limits and stable since December. Electronically Signed   By: Genevie Ann M.D.   On: 01/27/2022 08:33   DG Chest Portable 1 View  Result Date: 01/26/2022 CLINICAL DATA:  hypoxic, eval infiltrate EXAM: PORTABLE CHEST 1 VIEW COMPARISON:  CTA from the same day. Chest radiograph November 21, 2021. FINDINGS: Low lung volumes. Left basilar opacities. No visible pleural effusions or pneumothorax. Enlarged cardiac silhouette, accentuated by low lung volumes and AP technique. CABG and median sternotomy. Superior most two median sternotomy wires are fractured. IMPRESSION: 1. Left basilar opacities, concerning for pneumonia. 2. Cardiomegaly. Electronically Signed   By: Margaretha Sheffield M.D.   On: 01/26/2022 13:54   ECHOCARDIOGRAM COMPLETE  Result Date: 01/27/2022    ECHOCARDIOGRAM REPORT    Patient Name:   KAIESHA TONNER Date of Exam: 01/27/2022 Medical Rec #:  [id number]           Height:       68.0 in Accession #:    [id number]          Weight:       218.0 lb Date of Birth:  [birth year]          BSA:          2.120 m Patient Age:    13 years            BP:           104/52 mmHg Patient Gender: F                   HR:           68 bpm. Exam Location:  [facility] Procedure: 2D Echo and Intracardiac Opacification Agent Indications:     CAD Native Vessel I25.10  History:         Patient has prior history of Echocardiogram examinations, most                  recent 06/25/2021.  Sonographer:     Kathlen Brunswick RDCS Referring Phys:  [id number] [place] M ECKSTAT Diagnosing Phys: Kate Sable MD  Sonographer Comments: Suboptimal apical window and suboptimal subcostal window. Image acquisition challenging due to patient body habitus. IMPRESSIONS  1. Left ventricular ejection fraction, by estimation, is 60 to 65%. The left ventricle has normal function. The left ventricle has no regional wall motion abnormalities. There is mild left ventricular hypertrophy. Left ventricular diastolic parameters are consistent with Grade II diastolic dysfunction (pseudonormalization).  2. Right ventricular systolic function is normal. The right ventricular size is normal.  3. The mitral valve is normal in structure. Mild mitral valve regurgitation.  4. The aortic valve is tricuspid. Aortic valve regurgitation is not visualized.  5. The inferior vena cava is dilated in size with <50% respiratory variability, suggesting right atrial pressure of 15 mmHg. FINDINGS  Left Ventricle: Left ventricular ejection fraction, by estimation, is 60 to 65%. The left ventricle has normal function. The left ventricle has no regional wall motion abnormalities. Definity contrast agent was given IV to delineate the left ventricular  endocardial borders. The left ventricular internal cavity size was normal in size. There is mild left ventricular  hypertrophy. Left ventricular diastolic parameters are consistent with Grade II diastolic dysfunction (pseudonormalization). Right Ventricle: The right ventricular size is normal. No increase in right ventricular wall thickness. Right ventricular systolic function is normal. Left Atrium: Left atrial size  was normal in size. Right Atrium: Right atrial size was normal in size. Pericardium: There is no evidence of pericardial effusion. Mitral Valve: The mitral valve is normal in structure. Mild mitral valve regurgitation. Tricuspid Valve: The tricuspid valve is normal in structure. Tricuspid valve regurgitation is trivial. Aortic Valve: The aortic valve is tricuspid. Aortic valve regurgitation is not visualized. Aortic valve peak gradient measures 8.6 mmHg. Pulmonic Valve: The pulmonic valve was normal in structure. Pulmonic valve regurgitation is not visualized. Aorta: The aortic root is normal in size and structure. Venous: The inferior vena cava is dilated in size with less than 50% respiratory variability, suggesting right atrial pressure of 15 mmHg. IAS/Shunts: No atrial level shunt detected by color flow Doppler.  LEFT VENTRICLE PLAX 2D LVIDd:         4.80 cm     Diastology LVIDs:         3.20 cm     LV e' medial:    4.57 cm/s LV PW:         1.30 cm     LV E/e' medial:  22.8 LV IVS:        1.20 cm     LV e' lateral:   6.64 cm/s LVOT diam:     1.90 cm     LV E/e' lateral: 15.7 LV SV:         62 LV SV Index:   29 LVOT Area:     2.84 cm  LV Volumes (MOD) LV vol d, MOD A2C: 77.1 ml LV vol d, MOD A4C: 86.8 ml LV vol s, MOD A2C: 23.6 ml LV vol s, MOD A4C: 14.7 ml LV SV MOD A2C:     53.5 ml LV SV MOD A4C:     86.8 ml LV SV MOD BP:      63.0 ml RIGHT VENTRICLE RV Basal diam:  3.50 cm RV S prime:     11.00 cm/s TAPSE (M-mode): 1.8 cm LEFT ATRIUM             Index        RIGHT ATRIUM           Index LA diam:        3.30 cm 1.56 cm/m   RA Area:     12.60 cm LA Vol (A2C):   28.6 ml 13.49 ml/m  RA Volume:   31.40 ml   14.81 ml/m LA Vol (A4C):   49.3 ml 23.25 ml/m LA Biplane Vol: 39.9 ml 18.82 ml/m  AORTIC VALVE                 PULMONIC VALVE AV Area (Vmax): 2.30 cm     PV Vmax:       1.03 m/s AV Vmax:        147.00 cm/s  PV Peak grad:  4.2 mmHg AV Peak Grad:   8.6 mmHg LVOT Vmax:      119.00 cm/s LVOT Vmean:     78.500 cm/s LVOT VTI:       0.218 m  AORTA Ao Root diam: 2.90 cm MITRAL VALVE MV Area (PHT): 4.15 cm     SHUNTS MV Decel Time: 183 msec     Systemic VTI:  0.22 m MV E velocity: 104.00 cm/s  Systemic Diam: 1.90 cm MV A velocity: 63.00 cm/s MV E/A ratio:  1.65 Kate Sable MD Electronically signed by Kate Sable MD Signature Date/Time: 01/27/2022/1:04:59 PM    Final    CT HEAD CODE STROKE  WO CONTRAST  Result Date: 01/26/2022 CLINICAL DATA:  Code stroke.  Left arm weakness and facial droop EXAM: CT HEAD WITHOUT CONTRAST TECHNIQUE: Contiguous axial images were obtained from the base of the skull through the vertex without intravenous contrast. RADIATION DOSE REDUCTION: This exam was performed according to the departmental dose-optimization program which includes automated exposure control, adjustment of the mA and/or kV according to patient size and/or use of iterative reconstruction technique. COMPARISON:  Brain MRI 11/23/2021, CT head 09/08/2021 FINDINGS: Brain: There is no evidence of acute intracranial hemorrhage, extra-axial fluid collection, or acute infarct. Background parenchymal volume is normal. The ventricles are normal in size. Remote infarcts in the right parietal and occipital lobes are again noted with hyperdensity overlying the right occipital infarct consistent with cortical laminar necrosis. A remote infarct in the right pons is also again seen. There is no mass lesion.  There is no midline shift. Vascular: No dense vessel is seen. There is calcification of the bilateral cavernous ICAs. Skull: Normal. Negative for fracture or focal lesion. Sinuses/Orbits: Imaged paranasal sinuses are clear.  The globes and orbits are unremarkable. Other: None. ASPECTS [facility] Stroke Program Early CT Score) - Ganglionic level infarction (caudate, lentiform nuclei, internal capsule, insula, M1-M3 cortex): 7 - Supraganglionic infarction (M4-M6 cortex): 3 Total score (0-10 with 10 being normal): 10 IMPRESSION: 1. No acute intracranial hemorrhage or infarct. 2. ASPECTS is 10 3. Remote infarcts as above. These results were called by telephone at the time of interpretation on 01/26/2022 at 1:02 pm to provider Dr Quinn Axe, who verbally acknowledged these results. Electronically Signed   By: Valetta Mole M.D.   On: 01/26/2022 13:05   CT ANGIO HEAD NECK W WO CM W PERF (CODE STROKE)  Result Date: 01/26/2022 CLINICAL DATA:  Left facial droop and arm weakness EXAM: CT ANGIOGRAPHY HEAD AND NECK CT PERFUSION BRAIN TECHNIQUE: Multidetector CT imaging of the head and neck was performed using the standard protocol during bolus administration of intravenous contrast. Multiplanar CT image reconstructions and MIPs were obtained to evaluate the vascular anatomy. Carotid stenosis measurements (when applicable) are obtained utilizing NASCET criteria, using the distal internal carotid diameter as the denominator. Multiphase CT imaging of the brain was performed following IV bolus contrast injection. Subsequent parametric perfusion maps were calculated using RAPID software. RADIATION DOSE REDUCTION: This exam was performed according to the departmental dose-optimization program which includes automated exposure control, adjustment of the mA and/or kV according to patient size and/or use of iterative reconstruction technique. CONTRAST:  133mL OMNIPAQUE IOHEXOL 350 MG/ML SOLN COMPARISON:  None. Same-day noncontrast head CT, brain MRI 11/23/2021, CTA head/neck 06/24/2021 FINDINGS: CTA NECK FINDINGS Aortic arch: There is calcified atherosclerotic plaque of the aortic arch. The origins of the major branch vessels are patent. Subclavian arteries are  patent. There is mixed plaque at the origin of the right brachiocephalic artery without hemodynamically significant stenosis. Right carotid system: There is soft plaque throughout the right common carotid artery resulting in up to approximately 30% stenosis. There is primarily soft plaque in the right carotid bulb resulting in up to approximally 20% stenosis. The distal right internal carotid artery is patent. The right external carotid artery is occluded at its origin for a short-segment with distal reconstitution, unchanged. There is no dissection or aneurysm. Left carotid system: There is scattered soft and calcified plaque in the left common carotid artery without hemodynamically significant stenosis or occlusion. There is mixed plaque in the proximal left common carotid artery resulting in up to approximately 30%  stenosis. The distal left internal carotid artery is patent. The left external carotid artery is patent. There is no evidence of dissection or aneurysm. Vertebral arteries: The right vertebral artery is patent in the neck. The left vertebral artery is occluded from its origin through the mid V2 segment, with reconstitution of flow at the C3-C4 level. This is unchanged compared to the prior study from 06/24/2021. The remainder of the left vertebral artery is patent in the neck. Skeleton: There is no acute osseous abnormality or aggressive osseous lesion. There is no visible canal hematoma. Other neck: Soft tissues are unremarkable. Upper chest: There is patchy ground-glass opacity in the superior segment of the left lower lobe, incompletely imaged. Review of the MIP images confirms the above findings CTA HEAD FINDINGS Anterior circulation: There is calcified atherosclerotic plaque in the bilateral intracranial ICAs resulting in up to moderate stenosis bilaterally. The bilateral MCAs are patent with mild atherosclerotic irregularity. There is no proximal high-grade stenosis or occlusion. The bilateral  ACAs are patent with mild atherosclerotic irregularity. There is no proximal high-grade stenosis or occlusion. There is no aneurysm or AVM. Posterior circulation: The right V4 segment is patent but with multifocal atherosclerotic irregularity resulting in up to mild-to-moderate stenosis (7-239). PICA is identified on the right. There is mixed plaque in the proximal left V4 segment resulting in moderate to severe stenosis. The left vertebral artery is not identified beyond the PICA origin, unchanged. There is multifocal irregularity and narrowing of the basilar artery with up to moderate stenosis proximally (8-108). The basilar artery remains patent to its tip. The superior cerebellar arteries are patent. There is a fetal origin of the bilateral PCAs. There is irregularity of the bilateral P1 segments resulting in up to mild-to-moderate stenosis on the left (8-90). Otherwise, the PCAs are patent with mild multifocal irregularity and narrowing. There is no aneurysm or AVM. Venous sinuses: Patent. Anatomic variants: None. Review of the MIP images confirms the above findings CT Brain Perfusion Findings: ASPECTS: 10 CBF (<30%) Volume: 49mL Perfusion (Tmax>6.0s) volume: 48mL Mismatch Volume: 53mL Infarction Location:No infarct identified. The area of ischemia is in the left anterior temporal lobe. IMPRESSION: 1. No infarct core identified. 5 cc of ischemia was identified in the left anterior temporal lobe which may be artifactual. Recommend correlation with MRI. 2. No emergent large vessel occlusion. 3. Atherosclerotic plaque in the bilateral carotid systems resulting in up to 30% stenosis in the right common carotid artery and 20% stenosis in the right internal carotid artery, and 30% stenosis of the left internal carotid artery. Findings are not significantly changed. 4. 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 There is significant atherosclerotic disease in the  intracranial left V4 segment resulting in up to moderate to severe stenosis, and the V4 segment is not identified beyond the PICA origin, unchanged. The right vertebral artery is patent with up to mild-to-moderate stenosis. 5. Moderate stenosis of the bilateral intracranial ICAs and proximal basilar artery. No proximal high-grade stenosis or occlusion in the intracranial vasculature. 6. Unchanged occlusion of the right proximal external carotid artery with distal reconstitution. 7. Patchy ground-glass opacity in the left lower lobe may be infectious or inflammatory in nature, incompletely evaluated. Consider dedicated imaging of the chest as indicated. These results were paged to Dr. Quinn Axe via [employer] at 1:43 p.m. Electronically Signed   By: Valetta Mole M.D.   On: 01/26/2022 13:46    Medications   Scheduled Meds:  aspirin  81 mg Oral Q breakfast   chlorproMAZINE  75 mg Oral QHS   clonazepam  0.25 mg Oral BID   clopidogrel  75 mg Oral Daily   escitalopram  20 mg Oral Daily   ezetimibe  10 mg Oral Daily   icosapent Ethyl  2 g Oral BID   lamoTRIgine  200 mg Oral QHS   pregabalin  200 mg Oral BID   rosuvastatin  20 mg Oral Daily   sodium chloride flush  3 mL Intravenous Q12H   No recently discontinued medications to reconcile  LOS: 2 days   Richarda Osmond, DO [facility] 01/28/2022, 7:57 AM   Available by Epic secure chat 7AM-7PM. If 7PM-7AM, please contact night-coverage Refer to [email or web address] to contact the [facility] Attending or Consulting provider for this pt

## 2022-01-28 NOTE — Progress Notes (Signed)
°   01/28/22 2000  Clinical Encounter Type  Visited With Patient  Visit Type Initial  Referral From Social work  Consult/Referral To [employer] responded to consult from social work regarding AD. Patient was alert and of sound mind. No notary or witnesses available on Sunday evening. Chaplain on call tomorrow will follow up. Chaplain provided compassionate non-anxious presence and reflective listening. Patient appreciated [place] visit.

## 2022-01-28 NOTE — TOC Initial Note (Addendum)
Transition of Care [facility]) - Initial/Assessment Note    Patient Details  Name: Erika [name] MRN: [id number] Date of Birth: 09/30/1971  [facility] CM/SW Contact:    Alberteen Sam, LCSW Phone Number: 01/28/2022, 11:57 AM  Clinical Narrative:                  Update: Chaplain to be by tomorrow 2/6 with Advanced Directive paperwork to be complete.d   CSW spoke with patient's spouse regarding discharge planning.   He reports patient was recently at [facility] from [place] until January 11 2022.   Reports agreeable to send out referrals to local area for bed offers as they live in [place].   Agreeable for CSW to send list of private care agencies for future use, emailed to [email or web address]  Interested in Advanced Directive paperwork completion, CSW will attempt to identify on call chaplain today, if not informed patients spouse that someone will be by tomorrow with paperwork for completion. Confirmed patient is listed as DNR in chart.   No further questions or concerns at this time.   Expected Discharge Plan: Skilled Nursing Facility Barriers to Discharge: Continued Medical Work up   Patient Goals and CMS Choice Patient states their goals for this hospitalization and ongoing recovery are:: to go home CMS Medicare.gov Compare Post Acute Care list provided to:: Patient Choice offered to / list presented to : Patient, Spouse  Expected Discharge Plan and Services Expected Discharge Plan: [place]                                              Prior Living Arrangements/Services   Lives with:: Spouse                   Activities of Daily Living Home Assistive Devices/Equipment: Bedside commode/3-in-1, Wheelchair, Dentures (specify type) ADL Screening (condition at time of admission) Patient's cognitive ability adequate to safely complete daily activities?: Yes Is the patient deaf or have difficulty  hearing?: Yes Does the patient have difficulty seeing, even when wearing glasses/contacts?: No Does the patient have difficulty concentrating, remembering, or making decisions?: Yes Patient able to express need for assistance with ADLs?: No Does the patient have difficulty dressing or bathing?: No Independently performs ADLs?: Yes (appropriate for developmental age) Does the patient have difficulty walking or climbing stairs?: Yes Weakness of Legs: Both Weakness of Arms/Hands: Left  Permission Sought/Granted                  Emotional Assessment         Alcohol / Substance Use: Not Applicable Psych Involvement: No (comment)  Admission diagnosis:  Cardiomegaly [I51.7] NSTEMI (non-ST elevated myocardial infarction) ([place]) [I21.4] Sepsis ([place]) [A41.9] Sepsis with acute hypoxic respiratory failure without septic shock, due to unspecified organism ([place]) [A41.9, R65.20, J96.01] Patient Active Problem List   Diagnosis Date Noted   Pressure injury of skin 01/27/2022   Cardiomegaly    Polypharmacy    Sepsis ([place]) 01/26/2022   SIRS (systemic inflammatory response syndrome) ([place]) 12/23/2021   COVID-19 virus infection 12/22/2021   Hyperglycemia 12/22/2021   Major depressive disorder, recurrent episode, mild ([place]) 11/26/2021   Ankle fracture, left    Closed avulsion fracture of medial malleolus of left tibia    CAP (community acquired pneumonia) 11/21/2021   Cerebrovascular accident (CVA) due to  occlusion of vertebral artery (HCC) 09/11/2021   Cognitive change 09/11/2021   Cognitive communication deficit 08/10/2021   Urinary incontinence 08/10/2021   GERD (gastroesophageal reflux disease) 08/03/2021   Chronic post-traumatic stress disorder (PTSD)    Depression    Right pontine stroke ([place]) 06/28/2021   Hemiplegia and hemiparesis following cerebral infarction affecting left non-dominant side ([place]) 06/24/2021   Chronic diastolic CHF (congestive heart failure) ([place]) 06/24/2021    Fall 06/24/2021   Abnormal LFTs 06/24/2021   Acute respiratory failure with hypoxia ([place]) 06/24/2021   Olecranon bursitis of left elbow 06/12/2021   Chronic hip pain (Bilateral) 06/09/2021   Chronic use of opiate for therapeutic purpose 05/09/2021   Greater trochanteric bursitis of hip (Left) 03/09/2021   Bursitis of hip (Left) 70/35/0093   Uncomplicated opioid dependence ([place]) 02/20/2021   Acute conjunctivitis of left eye 01/02/2021   Unintentional weight loss 11/21/2020   Broken teeth (Right) 08/02/2020   History of MI (myocardial infarction) (July 2017) 08/02/2020   Neuropathy 06/09/2020   Dental abscess 06/09/2020   Foot drop (Left) 06/09/2020   Carotid stenosis, asymptomatic, right 04/27/2020   Chronic migraine 11/03/2019   Thrombocytosis 09/16/2019   Erythrocytosis 09/16/2019   Hypercalcemia 09/06/2019   Leukocytosis 09/06/2019   Polycythemia 09/06/2019   Chronic hip pain (Left) 08/27/2019   Osteoarthritis of hip (Left) 08/27/2019   Gluteal tendonitis of buttock (Left) 08/27/2019   Radial nerve palsy (Right) 07/15/2019   Neuropathy of radial nerve (Right) 06/30/2019   Abnormal bruising 06/25/2019   History of carotid endarterectomy (Right) 03/04/2019   Pain medication agreement signed 03/04/2019   Atypical facial pain (Right) 01/27/2019   Chronic ear pain (Right) 01/27/2019   Chronic jaw pain (Right) 01/27/2019   Geniculate Neuralgia (Right) 01/27/2019   Vitamin D deficiency 01/19/2019   Neurogenic pain 01/19/2019   Chronic anticoagulation (PLAVIX) 01/19/2019   Chronic pain syndrome 01/07/2019   Long term current use of opiate analgesic 01/07/2019   Long term prescription benzodiazepine use 01/07/2019   Pharmacologic therapy 01/07/2019   Disorder of skeletal system 01/07/2019   Problems influencing health status 01/07/2019   Chronic headaches (1ry area of Pain) (Right) 01/07/2019   Opiate use 09/24/2018   PVD (peripheral vascular disease) ([place]) 06/24/2018    Chronic ankle pain (Bilateral) 12/27/2017   Tendinopathy of gluteus medius (Right) 12/27/2017   Tendinopathy of gluteus medius (Left) 12/27/2017   Bilateral hip pain 12/26/2017   Chronic elbow pain (Left) 10/17/2017   Elevated troponin I level 05/21/2017   Major depressive disorder, recurrent episode, moderate (HCC) 11/19/2016   Insomnia 10/30/2016   Constipation 07/25/2016   S/P CABG x 4 07/06/2016   Bradycardia    CAD (coronary artery disease)    Carotid stenosis    CAD in native artery 06/29/2016   Elevated troponin 06/28/2016   Essential hypertension, malignant 06/28/2016   Mild tobacco abuse in early remission 06/28/2016   Essential hypertension    Malignant hypertension    Type 2 diabetes mellitus with other specified complication (HCC)    Chest pain with high risk for cardiac etiology 06/27/2016   NSTEMI (non-ST elevated myocardial infarction) ([place]) 06/27/2016   Proteinuria 03/15/2016   Renal artery stenosis ([place]) 03/15/2016   MDD (major depressive disorder) 10/17/2015   Agoraphobia with panic attacks 04/25/2015   HTN (hypertension), malignant 10/20/2013   Debility 04/02/2013   Cluster headache 03/20/2012   HLD (hyperlipidemia) 07/26/2010   PCP:  Lesleigh Noe, MD Pharmacy:   [id number] [place] - [address] AT [address] [place] [address] Phone: [phone] Fax: [phone]  [id number] - [place], [place] [address] [place] [address] Phone: [phone] Fax: [phone]     Social Determinants of Health (SDOH) Interventions    Readmission Risk Interventions Readmission Risk Prevention Plan 12/24/2021 11/23/2021 04/28/2020  Transportation Screening Complete Complete Complete  PCP or Specialist Appt within 5-7 Days - - Complete  Home Care Screening - - Complete  Medication Review (RN CM) - - Complete  Medication Review ([place] ) Complete Complete -   PCP or Specialist appointment within 3-5 days of discharge Complete Complete -  [place] or Home Care Consult Complete Complete -  SW Recovery Care/Counseling Consult - Complete -  Palliative Care Screening - Not Applicable -  [place] - Complete -  Some recent data might be hidden

## 2022-01-28 NOTE — Progress Notes (Signed)
Progress Note  Patient Name: Erika [name] Date of Encounter: 01/28/2022  [facility] Cardiologist: [name], MD   Subjective   Patient more awake and alert today.  Able to carry out a full conversation.  Denies chest pain or shortness of breath.  States left lower extremity weakness is improving compared to admission.  Inpatient Medications    Scheduled Meds:  aspirin  81 mg Oral Q breakfast   chlorproMAZINE  75 mg Oral QHS   clonazepam  0.25 mg Oral BID   clopidogrel  75 mg Oral Daily   escitalopram  20 mg Oral Daily   ezetimibe  10 mg Oral Daily   icosapent Ethyl  2 g Oral BID   lamoTRIgine  200 mg Oral QHS   pregabalin  200 mg Oral BID   rosuvastatin  20 mg Oral Daily   sodium chloride flush  3 mL Intravenous Q12H   Continuous Infusions:  heparin 1,450 Units/hr (01/28/22 0948)   PRN Meds: acetaminophen **OR** acetaminophen, ondansetron **OR** ondansetron (ZOFRAN) IV, oxyCODONE, polyethylene glycol   Vital Signs    Vitals:   01/28/22 0008 01/28/22 0347 01/28/22 0849 01/28/22 1002  BP: 136/64 127/65 (!) 141/74   Pulse: 78 68 80 82  Resp: 18 18 18    Temp: 98.1 F (36.7 C)  97.9 F (36.6 C)   TempSrc:   Oral   SpO2: 93% 97% 99% 92%  Weight:      Height:        Intake/Output Summary (Last 24 hours) at 01/28/2022 1131 Last data filed at 01/28/2022 1043 Gross per 24 hour  Intake 533 ml  Output 1100 ml  Net -567 ml   Last 3 Weights 01/27/2022 01/26/2022 12/22/2021  Weight (lbs) 218 lb 0.6 oz 226 lb 208 lb 15.9 oz  Weight (kg) 98.9 kg 102.513 kg 94.8 kg  Some encounter information is confidential and restricted. Go to Review Flowsheets activity to see all data.      Telemetry    Sinus rhythm- Personally Reviewed  ECG     - Personally Reviewed  Physical Exam   GEN: No acute distress.   Neck: No JVD Cardiac: RRR, no murmurs, rubs, or gallops.  Respiratory: Clear to auscultation bilaterally. GI: Soft, nontender, non-distended  MS: No edema;  left arm and left leg weakness noted Neuro: Left arm, left leg weakness noted, slight left facial droop. Psych: Normal affect   Labs    High Sensitivity Troponin:   Recent Labs  Lab 01/26/22 1349 01/26/22 1733 01/26/22 2111  TROPONINIHS 2,535* 2,613* 2,196*     Chemistry Recent Labs  Lab 01/26/22 1349 01/27/22 0441  NA 138 138  K 3.6 3.3*  CL 99 105  CO2 29 28  GLUCOSE 107* 78  BUN 16 11  CREATININE 0.86 0.84  CALCIUM 9.4 8.5*  PROT 7.1 5.6*  ALBUMIN 3.4* 2.7*  AST 30 21  ALT 16 13  ALKPHOS 52 46  BILITOT 0.5 0.3  GFRNONAA >60 >60  ANIONGAP 10 5    Lipids No results for input(s): CHOL, TRIG, HDL, LABVLDL, LDLCALC, CHOLHDL in the last 168 hours.  Hematology Recent Labs  Lab 01/26/22 1349 01/27/22 0441 01/28/22 0319  WBC 18.1* 9.9 8.5  RBC 4.23 3.74* 3.87  HGB 11.8* 10.3* 10.9*  HCT 38.0 33.1* 34.2*  MCV 89.8 88.5 88.4  MCH 27.9 27.5 28.2  MCHC 31.1 31.1 31.9  RDW 15.9* 16.1* 15.5  PLT 295 249 269   Thyroid No results for input(s):  TSH, FREET4 in the last 168 hours.  BNP Recent Labs  Lab 01/26/22 1733  BNP 351.5*    DDimer No results for input(s): DDIMER in the last 168 hours.   Radiology    MR BRAIN WO CONTRAST  Result Date: 01/26/2022 CLINICAL DATA:  Provided history: Left-sided weakness. Possible stroke. EXAM: MRI HEAD WITHOUT CONTRAST TECHNIQUE: Multiplanar, multiecho pulse sequences of the brain and surrounding structures were obtained without intravenous contrast. COMPARISON:  Noncontrast head CT and CT angiogram head/neck performed earlier today 01/26/2022. brain MRI 11/23/2021. FINDINGS: Brain: Mild intermittent motion degradation. Cerebral volume appears normal for age. Redemonstrated chronic cortical/subcortical infarcts within the right parietal and occipital lobes, with associated cortical laminar necrosis and possible chronic blood products at these sites. Multifocal T2 FLAIR hyperintense signal abnormality elsewhere within the cerebral  white matter, nonspecific but compatible with age advanced chronic small vessel ischemic disease. Redemonstrated chronic lacunar infarcts within the left basal ganglia and left thalamus. Unchanged chronic infarct within the right pons. As before, there is background extensive T2 FLAIR hyperintense signal abnormality within the pons and bilateral middle cerebellar peduncles, also likely reflecting chronic small vessel ischemia. There are a few scattered punctate supratentorial chronic parenchymal microhemorrhages. There is no acute infarct. No evidence of an intracranial mass. No extra-axial fluid collection. No midline shift. Vascular: Intracranial arterial vasculature more fully characterized on the same-day CT angiogram head/neck. Skull and upper cervical spine: No focal suspicious marrow lesion. Sinuses/Orbits: Visualized orbits show no acute finding. Mild mucosal thickening within the bilateral ethmoid sinuses. IMPRESSION: No evidence of acute intracranial abnormality. Stable non-contrast MRI appearance of the brain as compared to 11/23/2021. Chronic cortical/subcortical right PCA territory infarcts within the right parietal and occipital lobes. Age-advanced chronic small vessel ischemic changes within the cerebral white matter. Chronic lacunar infarcts within the left basal ganglia and left thalamus. Chronic infarct within the right pons. Background advanced chronic small vessel ischemic changes within the pons and bilateral middle cerebellar peduncles. Mild mucosal thickening within the bilateral ethmoid sinuses. Electronically Signed   By: Kellie Simmering D.O.   On: 01/26/2022 18:44   Portable chest 1 View  Result Date: 01/27/2022 CLINICAL DATA:  51 year old female with recent COVID-19. Weakness. Prior CABG. EXAM: PORTABLE CHEST 1 VIEW COMPARISON:  Portable chest 01/26/2022 and earlier. FINDINGS: Portable AP upright view at 0813 hours. Improved lung volumes, with more normal cardiac size and mediastinal  contour now stable to that in December. Prior CABG. Visualized tracheal air column is within normal limits. Bilateral infrahilar streaky lung opacity is acute. No pneumothorax, pleural effusion, pulmonary edema or consolidation. Negative visible bowel gas. No acute osseous abnormality identified. IMPRESSION: Improved lung volumes. Bilateral streaky bilateral lower lung opacity could be atelectasis or COVID-19 pneumonia in this setting. No pleural effusion. Prior CABG, heart size within normal limits and stable since December. Electronically Signed   By: Genevie Ann M.D.   On: 01/27/2022 08:33   DG Chest Portable 1 View  Result Date: 01/26/2022 CLINICAL DATA:  hypoxic, eval infiltrate EXAM: PORTABLE CHEST 1 VIEW COMPARISON:  CTA from the same day. Chest radiograph November 21, 2021. FINDINGS: Low lung volumes. Left basilar opacities. No visible pleural effusions or pneumothorax. Enlarged cardiac silhouette, accentuated by low lung volumes and AP technique. CABG and median sternotomy. Superior most two median sternotomy wires are fractured. IMPRESSION: 1. Left basilar opacities, concerning for pneumonia. 2. Cardiomegaly. Electronically Signed   By: Margaretha Sheffield M.D.   On: 01/26/2022 13:54   ECHOCARDIOGRAM COMPLETE  Result Date:  01/27/2022    ECHOCARDIOGRAM REPORT   Patient Name:   ARKIE TAGLIAFERRO Date of Exam: 01/27/2022 Medical Rec #:  [id number]           Height:       68.0 in Accession #:    [id number]          Weight:       218.0 lb Date of Birth:  1971-09-08          BSA:          2.120 m Patient Age:    51 years            BP:           104/52 mmHg Patient Gender: F                   HR:           68 bpm. Exam Location:  [facility] Procedure: 2D Echo and Intracardiac Opacification Agent Indications:     CAD Native Vessel I25.10  History:         Patient has prior history of Echocardiogram examinations, most                  recent 06/25/2021.  Sonographer:     Kathlen Brunswick RDCS Referring Phys:  [id number]  [name] M ECKSTAT Diagnosing Phys: Kate Sable MD  Sonographer Comments: Suboptimal apical window and suboptimal subcostal window. Image acquisition challenging due to patient body habitus. IMPRESSIONS  1. Left ventricular ejection fraction, by estimation, is 60 to 65%. The left ventricle has normal function. The left ventricle has no regional wall motion abnormalities. There is mild left ventricular hypertrophy. Left ventricular diastolic parameters are consistent with Grade II diastolic dysfunction (pseudonormalization).  2. Right ventricular systolic function is normal. The right ventricular size is normal.  3. The mitral valve is normal in structure. Mild mitral valve regurgitation.  4. The aortic valve is tricuspid. Aortic valve regurgitation is not visualized.  5. The inferior vena cava is dilated in size with <50% respiratory variability, suggesting right atrial pressure of 15 mmHg. FINDINGS  Left Ventricle: Left ventricular ejection fraction, by estimation, is 60 to 65%. The left ventricle has normal function. The left ventricle has no regional wall motion abnormalities. Definity contrast agent was given IV to delineate the left ventricular  endocardial borders. The left ventricular internal cavity size was normal in size. There is mild left ventricular hypertrophy. Left ventricular diastolic parameters are consistent with Grade II diastolic dysfunction (pseudonormalization). Right Ventricle: The right ventricular size is normal. No increase in right ventricular wall thickness. Right ventricular systolic function is normal. Left Atrium: Left atrial size was normal in size. Right Atrium: Right atrial size was normal in size. Pericardium: There is no evidence of pericardial effusion. Mitral Valve: The mitral valve is normal in structure. Mild mitral valve regurgitation. Tricuspid Valve: The tricuspid valve is normal in structure. Tricuspid valve regurgitation is trivial. Aortic Valve: The aortic valve is  tricuspid. Aortic valve regurgitation is not visualized. Aortic valve peak gradient measures 8.6 mmHg. Pulmonic Valve: The pulmonic valve was normal in structure. Pulmonic valve regurgitation is not visualized. Aorta: The aortic root is normal in size and structure. Venous: The inferior vena cava is dilated in size with less than 50% respiratory variability, suggesting right atrial pressure of 15 mmHg. IAS/Shunts: No atrial level shunt detected by color flow Doppler.  LEFT VENTRICLE PLAX 2D LVIDd:         4.80  cm     Diastology LVIDs:         3.20 cm     LV e' medial:    4.57 cm/s LV PW:         1.30 cm     LV E/e' medial:  22.8 LV IVS:        1.20 cm     LV e' lateral:   6.64 cm/s LVOT diam:     1.90 cm     LV E/e' lateral: 15.7 LV SV:         62 LV SV Index:   29 LVOT Area:     2.84 cm  LV Volumes (MOD) LV vol d, MOD A2C: 77.1 ml LV vol d, MOD A4C: 86.8 ml LV vol s, MOD A2C: 23.6 ml LV vol s, MOD A4C: 14.7 ml LV SV MOD A2C:     53.5 ml LV SV MOD A4C:     86.8 ml LV SV MOD BP:      63.0 ml RIGHT VENTRICLE RV Basal diam:  3.50 cm RV S prime:     11.00 cm/s TAPSE (M-mode): 1.8 cm LEFT ATRIUM             Index        RIGHT ATRIUM           Index LA diam:        3.30 cm 1.56 cm/m   RA Area:     12.60 cm LA Vol (A2C):   28.6 ml 13.49 ml/m  RA Volume:   31.40 ml  14.81 ml/m LA Vol (A4C):   49.3 ml 23.25 ml/m LA Biplane Vol: 39.9 ml 18.82 ml/m  AORTIC VALVE                 PULMONIC VALVE AV Area (Vmax): 2.30 cm     PV Vmax:       1.03 m/s AV Vmax:        147.00 cm/s  PV Peak grad:  4.2 mmHg AV Peak Grad:   8.6 mmHg LVOT Vmax:      119.00 cm/s LVOT Vmean:     78.500 cm/s LVOT VTI:       0.218 m  AORTA Ao Root diam: 2.90 cm MITRAL VALVE MV Area (PHT): 4.15 cm     SHUNTS MV Decel Time: 183 msec     Systemic VTI:  0.22 m MV E velocity: 104.00 cm/s  Systemic Diam: 1.90 cm MV A velocity: 63.00 cm/s MV E/A ratio:  1.65 Kate Sable MD Electronically signed by Kate Sable MD Signature Date/Time:  01/27/2022/1:04:59 PM    Final    CT HEAD CODE STROKE WO CONTRAST  Result Date: 01/26/2022 CLINICAL DATA:  Code stroke.  Left arm weakness and facial droop EXAM: CT HEAD WITHOUT CONTRAST TECHNIQUE: Contiguous axial images were obtained from the base of the skull through the vertex without intravenous contrast. RADIATION DOSE REDUCTION: This exam was performed according to the departmental dose-optimization program which includes automated exposure control, adjustment of the mA and/or kV according to patient size and/or use of iterative reconstruction technique. COMPARISON:  Brain MRI 11/23/2021, CT head 09/08/2021 FINDINGS: Brain: There is no evidence of acute intracranial hemorrhage, extra-axial fluid collection, or acute infarct. Background parenchymal volume is normal. The ventricles are normal in size. Remote infarcts in the right parietal and occipital lobes are again noted with hyperdensity overlying the right occipital infarct consistent with cortical laminar necrosis. A remote infarct in the right  pons is also again seen. There is no mass lesion.  There is no midline shift. Vascular: No dense vessel is seen. There is calcification of the bilateral cavernous ICAs. Skull: Normal. Negative for fracture or focal lesion. Sinuses/Orbits: Imaged paranasal sinuses are clear. The globes and orbits are unremarkable. Other: None. ASPECTS [facility] Stroke Program Early CT Score) - Ganglionic level infarction (caudate, lentiform nuclei, internal capsule, insula, M1-M3 cortex): 7 - Supraganglionic infarction (M4-M6 cortex): 3 Total score (0-10 with 10 being normal): 10 IMPRESSION: 1. No acute intracranial hemorrhage or infarct. 2. ASPECTS is 10 3. Remote infarcts as above. These results were called by telephone at the time of interpretation on 01/26/2022 at 1:02 pm to provider Dr Quinn Axe, who verbally acknowledged these results. Electronically Signed   By: Valetta Mole M.D.   On: 01/26/2022 13:05   CT ANGIO HEAD NECK W WO  CM W PERF (CODE STROKE)  Result Date: 01/26/2022 CLINICAL DATA:  Left facial droop and arm weakness EXAM: CT ANGIOGRAPHY HEAD AND NECK CT PERFUSION BRAIN TECHNIQUE: Multidetector CT imaging of the head and neck was performed using the standard protocol during bolus administration of intravenous contrast. Multiplanar CT image reconstructions and MIPs were obtained to evaluate the vascular anatomy. Carotid stenosis measurements (when applicable) are obtained utilizing NASCET criteria, using the distal internal carotid diameter as the denominator. Multiphase CT imaging of the brain was performed following IV bolus contrast injection. Subsequent parametric perfusion maps were calculated using RAPID software. RADIATION DOSE REDUCTION: This exam was performed according to the departmental dose-optimization program which includes automated exposure control, adjustment of the mA and/or kV according to patient size and/or use of iterative reconstruction technique. CONTRAST:  159mL OMNIPAQUE IOHEXOL 350 MG/ML SOLN COMPARISON:  None. Same-day noncontrast head CT, brain MRI 11/23/2021, CTA head/neck 06/24/2021 FINDINGS: CTA NECK FINDINGS Aortic arch: There is calcified atherosclerotic plaque of the aortic arch. The origins of the major branch vessels are patent. Subclavian arteries are patent. There is mixed plaque at the origin of the right brachiocephalic artery without hemodynamically significant stenosis. Right carotid system: There is soft plaque throughout the right common carotid artery resulting in up to approximately 30% stenosis. There is primarily soft plaque in the right carotid bulb resulting in up to approximally 20% stenosis. The distal right internal carotid artery is patent. The right external carotid artery is occluded at its origin for a short-segment with distal reconstitution, unchanged. There is no dissection or aneurysm. Left carotid system: There is scattered soft and calcified plaque in the left common  carotid artery without hemodynamically significant stenosis or occlusion. There is mixed plaque in the proximal left common carotid artery resulting in up to approximately 30% stenosis. The distal left internal carotid artery is patent. The left external carotid artery is patent. There is no evidence of dissection or aneurysm. Vertebral arteries: The right vertebral artery is patent in the neck. The left vertebral artery is occluded from its origin through the mid V2 segment, with reconstitution of flow at the C3-C4 level. This is unchanged compared to the prior study from 06/24/2021. The remainder of the left vertebral artery is patent in the neck. Skeleton: There is no acute osseous abnormality or aggressive osseous lesion. There is no visible canal hematoma. Other neck: Soft tissues are unremarkable. Upper chest: There is patchy ground-glass opacity in the superior segment of the left lower lobe, incompletely imaged. Review of the MIP images confirms the above findings CTA HEAD FINDINGS Anterior circulation: There is calcified atherosclerotic plaque in  the bilateral intracranial ICAs resulting in up to moderate stenosis bilaterally. The bilateral MCAs are patent with mild atherosclerotic irregularity. There is no proximal high-grade stenosis or occlusion. The bilateral ACAs are patent with mild atherosclerotic irregularity. There is no proximal high-grade stenosis or occlusion. There is no aneurysm or AVM. Posterior circulation: The right V4 segment is patent but with multifocal atherosclerotic irregularity resulting in up to mild-to-moderate stenosis (7-239). PICA is identified on the right. There is mixed plaque in the proximal left V4 segment resulting in moderate to severe stenosis. The left vertebral artery is not identified beyond the PICA origin, unchanged. There is multifocal irregularity and narrowing of the basilar artery with up to moderate stenosis proximally (8-108). The basilar artery remains patent  to its tip. The superior cerebellar arteries are patent. There is a fetal origin of the bilateral PCAs. There is irregularity of the bilateral P1 segments resulting in up to mild-to-moderate stenosis on the left (8-90). Otherwise, the PCAs are patent with mild multifocal irregularity and narrowing. There is no aneurysm or AVM. Venous sinuses: Patent. Anatomic variants: None. Review of the MIP images confirms the above findings CT Brain Perfusion Findings: ASPECTS: 10 CBF (<30%) Volume: 82mL Perfusion (Tmax>6.0s) volume: 8mL Mismatch Volume: 28mL Infarction Location:No infarct identified. The area of ischemia is in the left anterior temporal lobe. IMPRESSION: 1. No infarct core identified. 5 cc of ischemia was identified in the left anterior temporal lobe which may be artifactual. Recommend correlation with MRI. 2. No emergent large vessel occlusion. 3. Atherosclerotic plaque in the bilateral carotid systems resulting in up to 30% stenosis in the right common carotid artery and 20% stenosis in the right internal carotid artery, and 30% stenosis of the left internal carotid artery. Findings are not significantly changed. 4. 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 There is significant atherosclerotic disease in the intracranial left V4 segment resulting in up to moderate to severe stenosis, and the V4 segment is not identified beyond the PICA origin, unchanged. The right vertebral artery is patent with up to mild-to-moderate stenosis. 5. Moderate stenosis of the bilateral intracranial ICAs and proximal basilar artery. No proximal high-grade stenosis or occlusion in the intracranial vasculature. 6. Unchanged occlusion of the right proximal external carotid artery with distal reconstitution. 7. Patchy ground-glass opacity in the left lower lobe may be infectious or inflammatory in nature, incompletely evaluated. Consider dedicated imaging of the chest as indicated.  These results were paged to Dr. Quinn Axe via [employer] at 1:43 p.m. Electronically Signed   By: Valetta Mole M.D.   On: 01/26/2022 13:46    Cardiac Studies   TTE 01/27/2022 1. Left ventricular ejection fraction, by estimation, is 60 to 65%. The  left ventricle has normal function. The left ventricle has no regional  wall motion abnormalities. There is mild left ventricular hypertrophy.  Left ventricular diastolic parameters  are consistent with Grade II diastolic dysfunction (pseudonormalization).   2. Right ventricular systolic function is normal. The right ventricular  size is normal.   3. The mitral valve is normal in structure. Mild mitral valve  regurgitation.   4. The aortic valve is tricuspid. Aortic valve regurgitation is not  visualized.   5. The inferior vena cava is dilated in size with <50% respiratory  variability, suggesting right atrial pressure of 15 mmHg.   Patient Profile     51 y.o. female with history of CAD/CABG x 4 (2017), right CEA, PAD(left SFA stent), hypertension, hyperlipidemia,  former tobacco use presenting with left facial weakness/CVA, being seen for elevated troponins/NSTEMI.  Assessment & Plan    1.  NSTEMI, history of CABG x4 -Troponins peaked at 2613 -Continue heparin x48 hours (through midnight today) -Echo with preserved EF -Continue aspirin, Plavix, Zetia, Crestor. -Plan left heart cath tomorrow, keep n.p.o. midnight.   2.  Facial droop, somnolence, history of CVA -No acute findings on head CT, MRI -More coherent, left lower extremity weakness improving. -Management as per medicine team, neurology.     Total encounter time more than 50 minutes  Greater than 50% was spent in counseling and coordination of care with the patient    Shared Decision Making/Informed Consent The risks [stroke (1 in 1000), death (1 in 1000), kidney failure [usually temporary] (1 in 500), bleeding (1 in 200), allergic reaction [possibly serious] (1 in 200)], benefits  (diagnostic support and management of coronary artery disease) and alternatives of a cardiac catheterization were discussed in detail with Ms. Mcaffee and she is willing to proceed.       Signed, Kate Sable, MD  01/28/2022, 11:31 AM

## 2022-01-29 ENCOUNTER — Encounter: Payer: Managed Care, Other (non HMO) | Admitting: Speech Pathology

## 2022-01-29 ENCOUNTER — Ambulatory Visit: Payer: BC Managed Care – PPO

## 2022-01-29 ENCOUNTER — Encounter: Payer: Self-pay | Admitting: Cardiovascular Disease

## 2022-01-29 ENCOUNTER — Encounter
Admission: EM | Disposition: A | Discharge status: Skilled Nursing Facility | Payer: Self-pay | Source: Home / Self Care | Attending: Student in an Organized Health Care Education/Training Program

## 2022-01-29 DIAGNOSIS — I248 Other forms of acute ischemic heart disease: Secondary | ICD-10-CM

## 2022-01-29 DIAGNOSIS — I25810 Atherosclerosis of coronary artery bypass graft(s) without angina pectoris: Secondary | ICD-10-CM

## 2022-01-29 DIAGNOSIS — G44019 Episodic cluster headache, not intractable: Secondary | ICD-10-CM

## 2022-01-29 HISTORY — PX: LEFT HEART CATH AND CORS/GRAFTS ANGIOGRAPHY: CATH118250

## 2022-01-29 LAB — CBC
HCT: 33.2 % — ABNORMAL LOW (ref 36.0–46.0)
Hemoglobin: 10.6 g/dL — ABNORMAL LOW (ref 12.0–15.0)
MCH: 27.9 pg (ref 26.0–34.0)
MCHC: 31.9 g/dL (ref 30.0–36.0)
MCV: 87.4 fL (ref 80.0–100.0)
Platelets: 282 10*3/uL (ref 150–400)
RBC: 3.8 MIL/uL — ABNORMAL LOW (ref 3.87–5.11)
RDW: 15.2 % (ref 11.5–15.5)
WBC: 6.6 10*3/uL (ref 4.0–10.5)
nRBC: 0 % (ref 0.0–0.2)

## 2022-01-29 LAB — CREATININE, SERUM
Creatinine, Ser: 0.7 mg/dL (ref 0.44–1.00)
GFR, Estimated: >60 mL/min (ref >60)

## 2022-01-29 LAB — GLUCOSE, CAPILLARY
Glucose-Capillary: 94 mg/dL (ref 70–99)
Glucose-Capillary: 93 mg/dL (ref 70–99)
Glucose-Capillary: 89 mg/dL (ref 70–99)
Glucose-Capillary: 166 mg/dL — ABNORMAL HIGH (ref 70–99)
Glucose-Capillary: 172 mg/dL — ABNORMAL HIGH (ref 70–99)

## 2022-01-29 SURGERY — LEFT HEART CATH AND CORS/GRAFTS ANGIOGRAPHY
Anesthesia: Moderate Sedation

## 2022-01-29 MED ORDER — SODIUM CHLORIDE 0.9% FLUSH: 3.0000 mL | Freq: Two times a day (BID) | INTRAVENOUS | Status: DC

## 2022-01-29 MED ORDER — SODIUM CHLORIDE 0.9% FLUSH: 3.0000 mL | INTRAVENOUS | Status: DC | PRN

## 2022-01-29 MED ORDER — MIDAZOLAM HCL 2 MG/2ML IJ SOLN
INTRAMUSCULAR | Status: AC
  Filled 2022-01-29: qty 2

## 2022-01-29 MED ORDER — LIDOCAINE HCL 1 % IJ SOLN
INTRAMUSCULAR | Status: AC
  Filled 2022-01-29: qty 20

## 2022-01-29 MED ORDER — HEPARIN (PORCINE) IN NACL 1000-0.9 UT/500ML-% IV SOLN
INTRAVENOUS | Status: AC
  Filled 2022-01-29: qty 1000

## 2022-01-29 MED ORDER — FENTANYL CITRATE (PF) 100 MCG/2ML IJ SOLN
INTRAMUSCULAR | Status: AC
  Filled 2022-01-29: qty 2

## 2022-01-29 MED ORDER — MIDAZOLAM HCL 2 MG/2ML IJ SOLN
INTRAMUSCULAR | Status: DC | PRN
  Administered 2022-01-29: 1 mg via INTRAVENOUS

## 2022-01-29 MED ORDER — ENOXAPARIN SODIUM 40 MG/0.4ML IJ SOSY
40.0000 mg | PREFILLED_SYRINGE | INTRAMUSCULAR | Status: DC
  Administered 2022-01-29 – 2022-01-31 (×3): 40 mg via SUBCUTANEOUS
  Filled 2022-01-29 (×3): qty 0.4

## 2022-01-29 MED ORDER — SODIUM CHLORIDE 0.9 % IV SOLN: 250.0000 mL | INTRAVENOUS | Status: DC | PRN

## 2022-01-29 MED ORDER — CLOPIDOGREL BISULFATE 75 MG PO TABS
ORAL_TABLET | ORAL | Status: AC
  Administered 2022-01-29: 75 mg via ORAL
  Filled 2022-01-29: qty 1

## 2022-01-29 MED ORDER — IOHEXOL 300 MG/ML  SOLN
INTRAMUSCULAR | Status: DC | PRN
  Administered 2022-01-29: 52 mL

## 2022-01-29 MED ORDER — SODIUM CHLORIDE 0.9 % IV SOLN: INTRAVENOUS | Status: AC

## 2022-01-29 MED ORDER — LIDOCAINE HCL (PF) 1 % IJ SOLN
INTRAMUSCULAR | Status: DC | PRN
  Administered 2022-01-29: 20 mL

## 2022-01-29 MED ORDER — FENTANYL CITRATE (PF) 100 MCG/2ML IJ SOLN
INTRAMUSCULAR | Status: DC | PRN
  Administered 2022-01-29: 50 ug via INTRAVENOUS

## 2022-01-29 MED ORDER — ASPIRIN 81 MG PO CHEW
CHEWABLE_TABLET | ORAL | Status: AC
  Administered 2022-01-29: 81 mg via ORAL
  Filled 2022-01-29: qty 1

## 2022-01-29 MED ORDER — HEPARIN (PORCINE) IN NACL 1000-0.9 UT/500ML-% IV SOLN
INTRAVENOUS | Status: DC | PRN
  Administered 2022-01-29 (×2): 500 mL

## 2022-01-29 SURGICAL SUPPLY — 12 items
CANNULA 5F STIFF (CANNULA) ×1 IMPLANT
CATH INFINITI 5 FR IM (CATHETERS) ×1 IMPLANT
CATH INFINITI 5FR MULTPACK ANG (CATHETERS) ×1 IMPLANT
DEVICE CLOSURE MYNXGRIP 5F (Vascular Products) ×1 IMPLANT
DRAPE BRACHIAL (DRAPES) ×1 IMPLANT
PACK CARDIAC CATH (CUSTOM PROCEDURE TRAY) ×3 IMPLANT
PROTECTION STATION PRESSURIZED (MISCELLANEOUS) ×2
SET ATX SIMPLICITY (MISCELLANEOUS) ×1 IMPLANT
SHEATH AVANTI 5FR X 11CM (SHEATH) ×1 IMPLANT
STATION PROTECTION PRESSURIZED (MISCELLANEOUS) IMPLANT
WIRE EMERALD 3MM-J .035X260CM (WIRE) ×1 IMPLANT
WIRE GUIDERIGHT .035X150 (WIRE) ×1 IMPLANT

## 2022-01-29 NOTE — Progress Notes (Signed)
PROGRESS NOTE  Erika [name]    DOB: 06-Mar-1971, 51 y.o.  ZOX:[id number]  PCP: Lesleigh Noe, MD   Code Status: DNR   DOA: 01/26/2022   LOS: 3  Brief Narrative of Current Hospitalization  Erika [name] is a 51 y.o. female with a PMH significant for  severe vasculopathy with multiple complications including CAD status post CABG x4, CVA, carotid stenosis, renal artery stenosis, as well as diastolic CHF, depression/anxiety, chronic opiate and benzodiazepine use, essential hypertension, diabetes, hyperlipidemia, and chronic pain. They presented from home to the ED on 01/26/2022 with AMS. EMS was called and she was brought to ED for evaluation of suspected stroke. Her head CT/brain MRI were negative for acute stroke. Neurology was consulted for evaluation. In the ED, it was found that they had elevated troponins and ECG abnormalities concerning for NSTEMI. Cardiology was consulted to evaluate. They were treated with aspirin, heparin bolus + infusion, IV antibiotics, IV fluid support.  Patient was admitted to medicine service for further workup and management of NSTEMI, AMS as outlined in detail below.  01/29/22 -stable  Assessment & Plan  Principal Problem:   Sepsis ([place]) Active Problems:   HLD (hyperlipidemia)   Cluster headache   MDD (major depressive disorder)   Renal artery stenosis (HCC)   Chest pain with high risk for cardiac etiology   NSTEMI (non-ST elevated myocardial infarction) ([place])   Essential hypertension, malignant   Type 2 diabetes mellitus with other specified complication ([place])   CAD in native artery   CAD (coronary artery disease)   Carotid stenosis   S/P CABG x 4   PVD (peripheral vascular disease) (HCC)   Chronic pain syndrome   Long term current use of opiate analgesic   Long term prescription benzodiazepine use   Chronic anticoagulation (PLAVIX)   Chronic diastolic CHF (congestive heart failure) (HCC)   Cerebrovascular accident (CVA) due to  occlusion of vertebral artery (HCC)   Major depressive disorder, recurrent episode, mild (HCC)   Pressure injury of skin   Cardiomegaly   Polypharmacy  Severe sepsis- charted by admitting provider but she does not meet criteria as she had normal BP, HR, temperature, LA.  Possible CAP- O2 requirement on admission up to maximum of 2L. Currently on room air without respiratory distress. Chest xray findings are consistent with post-COVID pneumonia  - hold antibiotics and continue to monitor respiratory status closely  Delirium   polypharmacy   CVA rule out- multifactorial as described below. Patient states she has resolution of the abnormal symptoms. Family states she is more alert today. Dose adjustments have been made to help limit sedative effects. Suspect involvement of medication side effects with presenting symptoms as they have been intermittent and witnessed by staff after medication administration. She also has multiple antihypertensives but has had low blood pressures inpatient despite holding those medications so could have component of poor perfusion causing her presenting symptoms.  Head imaging thus far has been negative for acute CVA.  - limit sedative medications. - neurology signed off - PT/OT/SLP - CMP am - psych consulted to help guide psychiatric medication alterations    NSTEMI   CAD s/p CABG   h/o MI   Cardiomegaly - cardiology managing, appreciate care - cath 2/6 - continue heparin gtt >/=48 hours   Hx CVA - continue zetia, rosuvastatin   Anxiety/depression- chronic, stable - continue Thorazine, clonazepam, escitalopram, fluoxetine, Lamictal - decreased clonazepam and thorazine - f/u OP psych for further management  Type II DM-most  recent hgb A1c 11/22 was 7.0. blood sugars have been borderline low while inpatient.  - hold insulin and monitor  HTN-given relative hypotension  - hold home carvedilol, Imdur, torsemide, spironolactone, potassium  GERD- -  discontinue PPI   Neuropathy -continue pregabalin  DVT prophylaxis: heparin gtt  Diet:  Diet Orders (From admission, onward)     Start     Ordered   01/29/22 0001  Diet NPO time specified Except for: Sips with Meds  Diet effective midnight       Comments: NPO for solid foods after midnight, may have clear liquids until 5am, then NPO (this would be for inpatients and outpatients)  Question:  Except for  Answer:  Sips with Meds   01/28/22 2057            Subjective 01/29/22    Pt reports feeling overall fine today. Denies chest/back pain.  Disposition Plan & Communication  Patient status: Inpatient  Admitted From: Home Disposition: SNF Anticipated discharge date: will be medically stable for dc 2/7 unless any complications arise  Family Communication: husband at bedside  Consults, Procedures, Significant Events  Consultants:  Psych Neurology Cardiology   Procedures/significant events:  Brain MRI  Antimicrobials:  Anti-infectives (From admission, onward)    Start     Dose/Rate Route Frequency Ordered Stop   01/26/22 1445  cefTRIAXone (ROCEPHIN) 2 g in sodium chloride 0.9 % 100 mL IVPB  Status:  Discontinued        2 g 200 mL/hr over 30 Minutes Intravenous Every 24 hours 01/26/22 1439 01/27/22 1210   01/26/22 1445  azithromycin (ZITHROMAX) 500 mg in sodium chloride 0.9 % 250 mL IVPB  Status:  Discontinued        500 mg 250 mL/hr over 60 Minutes Intravenous Every 24 hours 01/26/22 1439 01/27/22 1210       Objective   Vitals:   01/29/22 0003 01/29/22 0435 01/29/22 0500 01/29/22 0724  BP: 139/73 136/77  128/64  Pulse: 81 77  63  Resp: 18 16  20   Temp: 98 F (36.7 C) 98.3 F (36.8 C)  97.8 F (36.6 C)  TempSrc:  Oral    SpO2: 91% 94%  94%  Weight:   98.7 kg   Height:        Intake/Output Summary (Last 24 hours) at 01/29/2022 0738 Last data filed at 01/29/2022 0635 Gross per 24 hour  Intake 643.31 ml  Output 0 ml  Net 643.31 ml    Filed Weights    01/26/22 1328 01/27/22 0600 01/29/22 0500  Weight: 102.5 kg 98.9 kg 98.7 kg    Patient BMI: Body mass index is 33.09 kg/m.   Physical Exam:  General: awake, alert, NAD HEENT: atraumatic, clear conjunctiva, anicteric sclera, MMM, hearing grossly normal Respiratory: normal respiratory effort. Cardiovascular: quick capillary refill  Nervous: A&O x3. no gross focal neurologic deficits, normal speech Extremities: moves all equally, no edema, normal tone Skin: dry, intact, normal temperature, normal color. No rashes, lesions or ulcers on exposed skin Psychiatry: normal mood, congruent affect  Labs   I have personally reviewed following labs and imaging studies CBC    Component Value Date/Time   WBC 6.6 01/29/2022 0436   RBC 3.80 (L) 01/29/2022 0436   HGB 10.6 (L) 01/29/2022 0436   HGB 14.2 09/11/2021 1413   HCT 33.2 (L) 01/29/2022 0436   HCT 43.5 09/11/2021 1413   PLT 282 01/29/2022 0436   PLT 433 09/11/2021 1413   MCV 87.4 01/29/2022 0436  MCV 89 09/11/2021 1413   MCV 89 01/11/2015 1022   MCH 27.9 01/29/2022 0436   MCHC 31.9 01/29/2022 0436   RDW 15.2 01/29/2022 0436   RDW 12.8 09/11/2021 1413   RDW 13.8 01/11/2015 1022   LYMPHSABS 2.7 01/26/2022 1349   LYMPHSABS 2.6 09/11/2021 1413   LYMPHSABS 3.4 01/11/2014 1312   MONOABS 0.6 01/26/2022 1349   MONOABS 0.7 01/11/2014 1312   EOSABS 0.1 01/26/2022 1349   EOSABS 0.2 09/11/2021 1413   EOSABS 0.3 01/11/2014 1312   BASOSABS 0.0 01/26/2022 1349   BASOSABS 0.0 09/11/2021 1413   BASOSABS 0.1 01/11/2014 1312   BMP Latest Ref Rng & Units 01/27/2022 01/26/2022 12/21/2021  Glucose 70 - 99 mg/dL 78 107(H) 134(H)  BUN 6 - 20 mg/dL 11 16 15   Creatinine 0.44 - 1.00 mg/dL 0.84 0.86 0.96  BUN/Creat Ratio 9 - 23 - - -  Sodium 135 - 145 mmol/L 138 138 134(L)  Potassium 3.5 - 5.1 mmol/L 3.3(L) 3.6 3.6  Chloride 98 - 111 mmol/L 105 99 96(L)  CO2 22 - 32 mmol/L 28 29 29   Calcium 8.9 - 10.3 mg/dL 8.5(L) 9.4 9.7   Imaging Studies  Portable  chest 1 View  Result Date: 01/27/2022 CLINICAL DATA:  51 year old female with recent COVID-19. Weakness. Prior CABG. EXAM: PORTABLE CHEST 1 VIEW COMPARISON:  Portable chest 01/26/2022 and earlier. FINDINGS: Portable AP upright view at 0813 hours. Improved lung volumes, with more normal cardiac size and mediastinal contour now stable to that in December. Prior CABG. Visualized tracheal air column is within normal limits. Bilateral infrahilar streaky lung opacity is acute. No pneumothorax, pleural effusion, pulmonary edema or consolidation. Negative visible bowel gas. No acute osseous abnormality identified. IMPRESSION: Improved lung volumes. Bilateral streaky bilateral lower lung opacity could be atelectasis or COVID-19 pneumonia in this setting. No pleural effusion. Prior CABG, heart size within normal limits and stable since December. Electronically Signed   By: Genevie Ann M.D.   On: 01/27/2022 08:33   ECHOCARDIOGRAM COMPLETE  Result Date: 01/27/2022    ECHOCARDIOGRAM REPORT   Patient Name:   CARAMIA BOUTIN Date of Exam: 01/27/2022 Medical Rec #:  [id number]           Height:       68.0 in Accession #:    [id number]          Weight:       218.0 lb Date of Birth:  Aug 18, 1971          BSA:          2.120 m Patient Age:    55 years            BP:           104/52 mmHg Patient Gender: F                   HR:           68 bpm. Exam Location:  [facility] Procedure: 2D Echo and Intracardiac Opacification Agent Indications:     CAD Native Vessel I25.10  History:         Patient has prior history of Echocardiogram examinations, most                  recent 06/25/2021.  Sonographer:     Kathlen [place] RDCS Referring Phys:  [id number] [place] M ECKSTAT Diagnosing Phys: Kate Sable MD  Sonographer Comments: Suboptimal apical window and suboptimal subcostal window. Image acquisition challenging due to patient  body habitus. IMPRESSIONS  1. Left ventricular ejection fraction, by estimation, is 60 to 65%. The left ventricle has  normal function. The left ventricle has no regional wall motion abnormalities. There is mild left ventricular hypertrophy. Left ventricular diastolic parameters are consistent with Grade II diastolic dysfunction (pseudonormalization).  2. Right ventricular systolic function is normal. The right ventricular size is normal.  3. The mitral valve is normal in structure. Mild mitral valve regurgitation.  4. The aortic valve is tricuspid. Aortic valve regurgitation is not visualized.  5. The inferior vena cava is dilated in size with <50% respiratory variability, suggesting right atrial pressure of 15 mmHg. FINDINGS  Left Ventricle: Left ventricular ejection fraction, by estimation, is 60 to 65%. The left ventricle has normal function. The left ventricle has no regional wall motion abnormalities. Definity contrast agent was given IV to delineate the left ventricular  endocardial borders. The left ventricular internal cavity size was normal in size. There is mild left ventricular hypertrophy. Left ventricular diastolic parameters are consistent with Grade II diastolic dysfunction (pseudonormalization). Right Ventricle: The right ventricular size is normal. No increase in right ventricular wall thickness. Right ventricular systolic function is normal. Left Atrium: Left atrial size was normal in size. Right Atrium: Right atrial size was normal in size. Pericardium: There is no evidence of pericardial effusion. Mitral Valve: The mitral valve is normal in structure. Mild mitral valve regurgitation. Tricuspid Valve: The tricuspid valve is normal in structure. Tricuspid valve regurgitation is trivial. Aortic Valve: The aortic valve is tricuspid. Aortic valve regurgitation is not visualized. Aortic valve peak gradient measures 8.6 mmHg. Pulmonic Valve: The pulmonic valve was normal in structure. Pulmonic valve regurgitation is not visualized. Aorta: The aortic root is normal in size and structure. Venous: The inferior vena cava  is dilated in size with less than 50% respiratory variability, suggesting right atrial pressure of 15 mmHg. IAS/Shunts: No atrial level shunt detected by color flow Doppler.  LEFT VENTRICLE PLAX 2D LVIDd:         4.80 cm     Diastology LVIDs:         3.20 cm     LV e' medial:    4.57 cm/s LV PW:         1.30 cm     LV E/e' medial:  22.8 LV IVS:        1.20 cm     LV e' lateral:   6.64 cm/s LVOT diam:     1.90 cm     LV E/e' lateral: 15.7 LV SV:         62 LV SV Index:   29 LVOT Area:     2.84 cm  LV Volumes (MOD) LV vol d, MOD A2C: 77.1 ml LV vol d, MOD A4C: 86.8 ml LV vol s, MOD A2C: 23.6 ml LV vol s, MOD A4C: 14.7 ml LV SV MOD A2C:     53.5 ml LV SV MOD A4C:     86.8 ml LV SV MOD BP:      63.0 ml RIGHT VENTRICLE RV Basal diam:  3.50 cm RV S prime:     11.00 cm/s TAPSE (M-mode): 1.8 cm LEFT ATRIUM             Index        RIGHT ATRIUM           Index LA diam:        3.30 cm 1.56 cm/m   RA Area:  12.60 cm LA Vol (A2C):   28.6 ml 13.49 ml/m  RA Volume:   31.40 ml  14.81 ml/m LA Vol (A4C):   49.3 ml 23.25 ml/m LA Biplane Vol: 39.9 ml 18.82 ml/m  AORTIC VALVE                 PULMONIC VALVE AV Area (Vmax): 2.30 cm     PV Vmax:       1.03 m/s AV Vmax:        147.00 cm/s  PV Peak grad:  4.2 mmHg AV Peak Grad:   8.6 mmHg LVOT Vmax:      119.00 cm/s LVOT Vmean:     78.500 cm/s LVOT VTI:       0.218 m  AORTA Ao Root diam: 2.90 cm MITRAL VALVE MV Area (PHT): 4.15 cm     SHUNTS MV Decel Time: 183 msec     Systemic VTI:  0.22 m MV E velocity: 104.00 cm/s  Systemic Diam: 1.90 cm MV A velocity: 63.00 cm/s MV E/A ratio:  1.65 Kate Sable MD Electronically signed by Kate Sable MD Signature Date/Time: 01/27/2022/1:04:59 PM    Final     Medications   Scheduled Meds:  aspirin  81 mg Oral Q breakfast   chlorproMAZINE  75 mg Oral QHS   clonazepam  0.25 mg Oral BID   clopidogrel  75 mg Oral Daily   escitalopram  20 mg Oral Daily   ezetimibe  10 mg Oral Daily   icosapent Ethyl  2 g Oral BID   lamoTRIgine   200 mg Oral QHS   pregabalin  200 mg Oral BID   rosuvastatin  20 mg Oral Daily   sodium chloride flush  3 mL Intravenous Q12H   No recently discontinued medications to reconcile  LOS: 3 days   Richarda Osmond, DO [facility] 01/29/2022, 7:38 AM   Available by Epic secure chat 7AM-7PM. If 7PM-7AM, please contact night-coverage Refer to [email or web address] to contact the [facility] Attending or Consulting provider for this pt

## 2022-01-29 NOTE — TOC Progression Note (Signed)
Transition of Care [name]) - Progression Note    Patient Details  Name: Erika [name] MRN: [id number] Date of Birth: 04-23-1971  Transition of Care [facility]) CM/SW [place], [place] Phone Number: 01/29/2022, 1:20 PM  Clinical Narrative:     Patient has no bed offers at this time, CSW has faxed out to expanded geographical area for bed offers.   Pending bed offers.   Expected Discharge Plan: Skilled Nursing Facility Barriers to Discharge: Continued Medical Work up  Expected Discharge Plan and Services Expected Discharge Plan: [place]                                               Social Determinants of Health (SDOH) Interventions    Readmission Risk Interventions Readmission Risk Prevention Plan 12/24/2021 11/23/2021 04/28/2020  Transportation Screening Complete Complete Complete  PCP or Specialist Appt within 5-7 Days - - Complete  Home Care Screening - - Complete  Medication Review (RN CM) - - Complete  Medication Review Press photographer) Complete Complete -  PCP or Specialist appointment within 3-5 days of discharge Complete Complete -  [place] or Home Care Consult Complete Complete -  SW Recovery Care/Counseling Consult - Complete -  Palliative Care Screening - Not Applicable -  [place] - Complete -  Some recent data might be hidden

## 2022-01-29 NOTE — Progress Notes (Signed)
°   01/29/22 1000  Clinical Encounter Type  Visited With Patient and family together  Visit Type Follow-up (Chaplain facilitated completion of AD)  Consult/Referral To Chaplain   Chaplain was asked to facilitate AD. AD completed. 4 copies to patient, one to patient's file and one for Chaplain file

## 2022-01-29 NOTE — Progress Notes (Signed)
OT Cancellation Note  Patient Details Name: AMSI GRIMLEY MRN: [id number] DOB: August 03, 1971   Cancelled Treatment:    Reason Eval/Treat Not Completed: Patient at procedure or test/ unavailable. Pt out of room, per chart scheduled for heart cath today. Will re-attempt OT tx at later date/time as available and appropriate.   Ardeth Perfect., MPH, MS, OTR/L ascom [phone] 01/29/22, 10:45 AM

## 2022-01-29 NOTE — Interval H&P Note (Signed)
History and Physical Interval Note:  01/29/2022 10:44 AM  Erika [name]  has presented today for surgery, with the diagnosis of nstemi.  The various methods of treatment have been discussed with the patient and family. After consideration of risks, benefits and other options for treatment, the patient has consented to  Procedure(s): LEFT HEART CATH AND CORS/GRAFTS ANGIOGRAPHY ([name]) as a surgical intervention.  The patient's history has been reviewed, patient examined, no change in status, stable for surgery.  I have reviewed the patient's chart and labs.  Questions were answered to the patient's satisfaction.     [name]

## 2022-01-29 NOTE — Progress Notes (Signed)
Progress Note  Patient Name: Erika [name] Date of Encounter: 01/29/2022  [facility] Cardiologist: [name], MD   Subjective   Plan for heart cath today. Mental status back to baseline. No chest pain or SOB.   Inpatient Medications    Scheduled Meds:  aspirin  81 mg Oral Q breakfast   chlorproMAZINE  75 mg Oral QHS   clonazepam  0.25 mg Oral BID   clopidogrel  75 mg Oral Daily   escitalopram  20 mg Oral Daily   ezetimibe  10 mg Oral Daily   icosapent Ethyl  2 g Oral BID   lamoTRIgine  200 mg Oral QHS   pregabalin  200 mg Oral BID   rosuvastatin  20 mg Oral Daily   sodium chloride flush  3 mL Intravenous Q12H   Continuous Infusions:  sodium chloride     sodium chloride 1 mL/kg/hr (01/29/22 0538)   PRN Meds: sodium chloride, acetaminophen **OR** acetaminophen, ondansetron **OR** ondansetron (ZOFRAN) IV, oxyCODONE, polyethylene glycol, sodium chloride flush   Vital Signs    Vitals:   01/29/22 0003 01/29/22 0435 01/29/22 0500 01/29/22 0724  BP: 139/73 136/77  128/64  Pulse: 81 77  63  Resp: 18 16  20   Temp: 98 F (36.7 C) 98.3 F (36.8 C)  97.8 F (36.6 C)  TempSrc:  Oral    SpO2: 91% 94%  94%  Weight:   98.7 kg   Height:        Intake/Output Summary (Last 24 hours) at 01/29/2022 0815 Last data filed at 01/29/2022 5638 Gross per 24 hour  Intake 643.31 ml  Output 0 ml  Net 643.31 ml   Last 3 Weights 01/29/2022 01/27/2022 01/26/2022  Weight (lbs) 217 lb 9.5 oz 218 lb 0.6 oz 226 lb  Weight (kg) 98.7 kg 98.9 kg 102.513 kg  Some encounter information is confidential and restricted. Go to Review Flowsheets activity to see all data.      Telemetry    NSR, HR 70-80s - Personally Reviewed  ECG    No new - Personally Reviewed  Physical Exam   GEN: No acute distress.   Neck: No JVD Cardiac: RRR, no murmurs, rubs, or gallops.  Respiratory: Clear to auscultation bilaterally. GI: Soft, nontender, non-distended  MS: No edema; No deformity. Neuro:   Nonfocal  Psych: Normal affect   Labs    High Sensitivity Troponin:   Recent Labs  Lab 01/26/22 1349 01/26/22 1733 01/26/22 2111  TROPONINIHS 2,535* 2,613* 2,196*     Chemistry Recent Labs  Lab 01/26/22 1349 01/27/22 0441  NA 138 138  K 3.6 3.3*  CL 99 105  CO2 29 28  GLUCOSE 107* 78  BUN 16 11  CREATININE 0.86 0.84  CALCIUM 9.4 8.5*  PROT 7.1 5.6*  ALBUMIN 3.4* 2.7*  AST 30 21  ALT 16 13  ALKPHOS 52 46  BILITOT 0.5 0.3  GFRNONAA >60 >60  ANIONGAP 10 5    Lipids No results for input(s): CHOL, TRIG, HDL, LABVLDL, LDLCALC, CHOLHDL in the last 168 hours.  Hematology Recent Labs  Lab 01/27/22 0441 01/28/22 0319 01/29/22 0436  WBC 9.9 8.5 6.6  RBC 3.74* 3.87 3.80*  HGB 10.3* 10.9* 10.6*  HCT 33.1* 34.2* 33.2*  MCV 88.5 88.4 87.4  MCH 27.5 28.2 27.9  MCHC 31.1 31.9 31.9  RDW 16.1* 15.5 15.2  PLT 249 269 282   Thyroid No results for input(s): TSH, FREET4 in the last 168 hours.  BNP Recent Labs  Lab 01/26/22 1733  BNP 351.5*    DDimer No results for input(s): DDIMER in the last 168 hours.   Radiology    Portable chest 1 View  Result Date: 01/27/2022 CLINICAL DATA:  51 year old female with recent COVID-19. Weakness. Prior CABG. EXAM: PORTABLE CHEST 1 VIEW COMPARISON:  Portable chest 01/26/2022 and earlier. FINDINGS: Portable AP upright view at 0813 hours. Improved lung volumes, with more normal cardiac size and mediastinal contour now stable to that in December. Prior CABG. Visualized tracheal air column is within normal limits. Bilateral infrahilar streaky lung opacity is acute. No pneumothorax, pleural effusion, pulmonary edema or consolidation. Negative visible bowel gas. No acute osseous abnormality identified. IMPRESSION: Improved lung volumes. Bilateral streaky bilateral lower lung opacity could be atelectasis or COVID-19 pneumonia in this setting. No pleural effusion. Prior CABG, heart size within normal limits and stable since December. Electronically  Signed   By: Genevie Ann M.D.   On: 01/27/2022 08:33   ECHOCARDIOGRAM COMPLETE  Result Date: 01/27/2022    ECHOCARDIOGRAM REPORT   Patient Name:   Erika [name] Date of Exam: 01/27/2022 Medical Rec #:  [id number]           Height:       68.0 in Accession #:    [id number]          Weight:       218.0 lb Date of Birth:  06-03-1971          BSA:          2.120 m Patient Age:    51 years            BP:           104/52 mmHg Patient Gender: F                   HR:           68 bpm. Exam Location:  [facility] Procedure: 2D Echo and Intracardiac Opacification Agent Indications:     CAD Native Vessel I25.10  History:         Patient has prior history of Echocardiogram examinations, most                  recent 06/25/2021.  Sonographer:     Kathlen Brunswick RDCS Referring Phys:  [id number] [place] M ECKSTAT Diagnosing Phys: Kate Sable MD  Sonographer Comments: Suboptimal apical window and suboptimal subcostal window. Image acquisition challenging due to patient body habitus. IMPRESSIONS  1. Left ventricular ejection fraction, by estimation, is 60 to 65%. The left ventricle has normal function. The left ventricle has no regional wall motion abnormalities. There is mild left ventricular hypertrophy. Left ventricular diastolic parameters are consistent with Grade II diastolic dysfunction (pseudonormalization).  2. Right ventricular systolic function is normal. The right ventricular size is normal.  3. The mitral valve is normal in structure. Mild mitral valve regurgitation.  4. The aortic valve is tricuspid. Aortic valve regurgitation is not visualized.  5. The inferior vena cava is dilated in size with <50% respiratory variability, suggesting right atrial pressure of 15 mmHg. FINDINGS  Left Ventricle: Left ventricular ejection fraction, by estimation, is 60 to 65%. The left ventricle has normal function. The left ventricle has no regional wall motion abnormalities. Definity contrast agent was given IV to delineate the left  ventricular  endocardial borders. The left ventricular internal cavity size was normal in size. There is mild left ventricular hypertrophy. Left ventricular diastolic parameters are consistent with Grade  II diastolic dysfunction (pseudonormalization). Right Ventricle: The right ventricular size is normal. No increase in right ventricular wall thickness. Right ventricular systolic function is normal. Left Atrium: Left atrial size was normal in size. Right Atrium: Right atrial size was normal in size. Pericardium: There is no evidence of pericardial effusion. Mitral Valve: The mitral valve is normal in structure. Mild mitral valve regurgitation. Tricuspid Valve: The tricuspid valve is normal in structure. Tricuspid valve regurgitation is trivial. Aortic Valve: The aortic valve is tricuspid. Aortic valve regurgitation is not visualized. Aortic valve peak gradient measures 8.6 mmHg. Pulmonic Valve: The pulmonic valve was normal in structure. Pulmonic valve regurgitation is not visualized. Aorta: The aortic root is normal in size and structure. Venous: The inferior vena cava is dilated in size with less than 50% respiratory variability, suggesting right atrial pressure of 15 mmHg. IAS/Shunts: No atrial level shunt detected by color flow Doppler.  LEFT VENTRICLE PLAX 2D LVIDd:         4.80 cm     Diastology LVIDs:         3.20 cm     LV e' medial:    4.57 cm/s LV PW:         1.30 cm     LV E/e' medial:  22.8 LV IVS:        1.20 cm     LV e' lateral:   6.64 cm/s LVOT diam:     1.90 cm     LV E/e' lateral: 15.7 LV SV:         62 LV SV Index:   29 LVOT Area:     2.84 cm  LV Volumes (MOD) LV vol d, MOD A2C: 77.1 ml LV vol d, MOD A4C: 86.8 ml LV vol s, MOD A2C: 23.6 ml LV vol s, MOD A4C: 14.7 ml LV SV MOD A2C:     53.5 ml LV SV MOD A4C:     86.8 ml LV SV MOD BP:      63.0 ml RIGHT VENTRICLE RV Basal diam:  3.50 cm RV S prime:     11.00 cm/s TAPSE (M-mode): 1.8 cm LEFT ATRIUM             Index        RIGHT ATRIUM            Index LA diam:        3.30 cm 1.56 cm/m   RA Area:     12.60 cm LA Vol (A2C):   28.6 ml 13.49 ml/m  RA Volume:   31.40 ml  14.81 ml/m LA Vol (A4C):   49.3 ml 23.25 ml/m LA Biplane Vol: 39.9 ml 18.82 ml/m  AORTIC VALVE                 PULMONIC VALVE AV Area (Vmax): 2.30 cm     PV Vmax:       1.03 m/s AV Vmax:        147.00 cm/s  PV Peak grad:  4.2 mmHg AV Peak Grad:   8.6 mmHg LVOT Vmax:      119.00 cm/s LVOT Vmean:     78.500 cm/s LVOT VTI:       0.218 m  AORTA Ao Root diam: 2.90 cm MITRAL VALVE MV Area (PHT): 4.15 cm     SHUNTS MV Decel Time: 183 msec     Systemic VTI:  0.22 m MV E velocity: 104.00 cm/s  Systemic Diam: 1.90 cm MV A velocity: 63.00  cm/s MV E/A ratio:  1.65 Kate Sable MD Electronically signed by Kate Sable MD Signature Date/Time: 01/27/2022/1:04:59 PM    Final     Cardiac Studies   TTE 01/27/2022 1. Left ventricular ejection fraction, by estimation, is 60 to 65%. The  left ventricle has normal function. The left ventricle has no regional  wall motion abnormalities. There is mild left ventricular hypertrophy.  Left ventricular diastolic parameters  are consistent with Grade II diastolic dysfunction (pseudonormalization).   2. Right ventricular systolic function is normal. The right ventricular  size is normal.   3. The mitral valve is normal in structure. Mild mitral valve  regurgitation.   4. The aortic valve is tricuspid. Aortic valve regurgitation is not  visualized.   5. The inferior vena cava is dilated in size with <50% respiratory  variability, suggesting right atrial pressure of 15 mmHg.   Patient Profile     51 y.o. female with history of CAD/CABG x 4 (2017), right CEA, PAD(left SFA stent), hypertension, hyperlipidemia, former tobacco use presenting with left facial weakness/CVA, being seen for elevated troponins/NSTEMI.  Assessment & Plan    NSTEMI H/o CABGx4 in 2017 - HS troponin peaked at 2613 - IV heparin x48 hours completed - Echo showed  preserved EF - plan for cardiac cath today - continue Aspirin, Plavix, Zetia, Statin  H/o CVA - CT head/MRI head this admission without acute findings - continue zetia and statin  Possible CAP/Sepsis [place] CVA rule out - suspected multifactorial due to medications, hypotension, possible CVA - imaging did not show acute CVA - abx held - PT/OP/SLP - psych consulted - neurology signed off - back to baseline mental status   For questions or updates, please contact [facility] Please consult [email or web address] for contact info under        Signed, Santanna Whitford Ninfa Meeker, PA-C  01/29/2022, 8:15 AM

## 2022-01-29 NOTE — Progress Notes (Signed)
PT Cancellation Note  Patient Details Name: Erika [name] MRN: [id number] DOB: 13-Nov-1971   Cancelled Treatment:    Reason Eval/Treat Not Completed: Patient at procedure or test/unavailable. Currently out of room for heart cath. Will re-attempt another time.   Huntley Knoop 01/29/2022, 11:29 AM Greggory Stallion, PT, DPT, GCS [phone]

## 2022-01-30 LAB — GLUCOSE, CAPILLARY
Glucose-Capillary: 101 mg/dL — ABNORMAL HIGH (ref 70–99)
Glucose-Capillary: 161 mg/dL — ABNORMAL HIGH (ref 70–99)
Glucose-Capillary: 116 mg/dL — ABNORMAL HIGH (ref 70–99)
Glucose-Capillary: 118 mg/dL — ABNORMAL HIGH (ref 70–99)

## 2022-01-30 MED ORDER — ASPIRIN EC 81 MG PO TBEC
81.0000 mg | DELAYED_RELEASE_TABLET | Freq: Every day | ORAL | Status: DC
  Administered 2022-01-30 – 2022-02-01 (×3): 81 mg via ORAL
  Filled 2022-01-30 (×3): qty 1

## 2022-01-30 MED ORDER — CARVEDILOL 6.25 MG PO TABS
6.2500 mg | ORAL_TABLET | Freq: Two times a day (BID) | ORAL | Status: DC
  Administered 2022-01-30 – 2022-02-01 (×5): 6.25 mg via ORAL
  Filled 2022-01-30 (×5): qty 1

## 2022-01-30 NOTE — Progress Notes (Signed)
PROGRESS NOTE  Erika [name]    DOB: 09-18-1971, 51 y.o.  TIR:[id number]  PCP: Lesleigh Noe, MD   Code Status: DNR   DOA: 01/26/2022   LOS: 4  Brief Narrative of Current Hospitalization  Erika [name] is a 51 y.o. female with a PMH significant for  severe vasculopathy with multiple complications including CAD status post CABG x4, CVA, carotid stenosis, renal artery stenosis, as well as diastolic CHF, depression/anxiety, chronic opiate and benzodiazepine use, essential hypertension, diabetes, hyperlipidemia, and chronic pain. They presented from home to the ED on 01/26/2022 with AMS. EMS was called and she was brought to ED for evaluation of suspected stroke. Her head CT/brain MRI were negative for acute stroke. Neurology was consulted for evaluation. In the ED, it was found that they had elevated troponins and ECG abnormalities concerning for NSTEMI. Cardiology was consulted to evaluate. They were treated with aspirin, heparin bolus + infusion, IV antibiotics, IV fluid support.  Patient was admitted to medicine service for further workup and management of NSTEMI, AMS as outlined in detail below. AMS resolved and thought to be primarily related to medication interactions/side effects as she is on several sedative and psychotropic medications.  She underwent left heart cath 2/6.   01/30/22 -stable, medically ready for discharge to SNF when bed available. Cleared by cardiology.   Assessment & Plan  Principal Problem:   Sepsis ([place]) Active Problems:   HLD (hyperlipidemia)   Cluster headache   MDD (major depressive disorder)   Renal artery stenosis (HCC)   Chest pain with high risk for cardiac etiology   NSTEMI (non-ST elevated myocardial infarction) ([place])   Essential hypertension, malignant   Type 2 diabetes mellitus with other specified complication (HCC)   CAD in native artery   CAD (coronary artery disease)   Carotid stenosis   S/P CABG x 4   PVD (peripheral vascular  disease) (HCC)   Chronic pain syndrome   Long term current use of opiate analgesic   Long term prescription benzodiazepine use   Chronic anticoagulation (PLAVIX)   Chronic diastolic CHF (congestive heart failure) (HCC)   Cerebrovascular accident (CVA) due to occlusion of vertebral artery (HCC)   Major depressive disorder, recurrent episode, mild (HCC)   Pressure injury of skin   Cardiomegaly   Polypharmacy   Demand ischemia (HCC)  Severe sepsis- charted by admitting provider but she does not meet criteria as she had normal BP, HR, temperature, LA.  Possible CAP- O2 requirement on admission up to maximum of 2L. Currently on room air without respiratory distress. Chest xray findings are consistent with post-COVID pneumonia  - hold antibiotics and continue to monitor respiratory status closely  Delirium   polypharmacy   acute CVA ruled out- multifactorial as described below.  Dose adjustments have been made to help limit sedative effects. Suspect involvement of medication side effects with presenting symptoms as they have been intermittent and witnessed by staff after medication administration. She also has multiple antihypertensives but has had low blood pressures inpatient despite holding those medications so could have component of poor perfusion causing her presenting symptoms.  Head imaging thus far has been negative for acute CVA. Appears to be at baseline today  - limit sedative medications. - neurology signed off - PT/OT/SLP - CMP am - psych consulted to help guide psychiatric medication alterations    NSTEMI   CAD s/p CABG   h/o MI   Cardiomegaly - cardiology managing, appreciate care - cath 2/6- patent grafts and  other wise stable anatomy  - heparin gtt discontinued   Hx CVA - continue zetia, rosuvastatin   Anxiety/depression- chronic, stable - continue Thorazine, clonazepam, escitalopram, fluoxetine, Lamictal - decreased clonazepam and thorazine - f/u OP psych for further  management  Type II DM-most recent hgb A1c 11/22 was 7.0. blood sugars have been borderline low while inpatient. - hold insulin and monitor  HTN-given relative hypotension on admission but has been gradually increasing - hold home Imdur, torsemide, spironolactone, potassium - will start to slowly restart blood pressure medications and avoid hypotension - carvedilol today - further follow up as outpatient whether to restart further antihypertensives  GERD- - discontinue PPI due to long-term negative side effects   Neuropathy -continue pregabalin  DVT prophylaxis: enoxaparin (LOVENOX) injection 40 mg Start: 01/29/22 2200heparin gtt  Diet:  Diet Orders (From admission, onward)     Start     Ordered   01/29/22 1143  Diet Heart Room service appropriate? Yes; Fluid consistency: Thin  Diet effective now       Question Answer Comment  Room service appropriate? Yes   Fluid consistency: Thin      01/29/22 1142            Subjective 01/30/22    Pt reports feeling well today. Has no complaints or concerns.   Disposition Plan & Communication  Patient status: Inpatient  Admitted From: Home Disposition: SNF Anticipated discharge date: medically stable for dc to SNF when bed available  Family Communication: husband at bedside  Consults, Procedures, Significant Events  Consultants:  Psych Neurology Cardiology   Procedures/significant events:  Brain MRI  Antimicrobials:  Anti-infectives (From admission, onward)    Start     Dose/Rate Route Frequency Ordered Stop   01/26/22 1445  cefTRIAXone (ROCEPHIN) 2 g in sodium chloride 0.9 % 100 mL IVPB  Status:  Discontinued        2 g 200 mL/hr over 30 Minutes Intravenous Every 24 hours 01/26/22 1439 01/27/22 1210   01/26/22 1445  azithromycin (ZITHROMAX) 500 mg in sodium chloride 0.9 % 250 mL IVPB  Status:  Discontinued        500 mg 250 mL/hr over 60 Minutes Intravenous Every 24 hours 01/26/22 1439 01/27/22 1210        Objective   Vitals:   01/29/22 1541 01/29/22 1957 01/29/22 2342 01/30/22 0530  BP: (!) 137/58 (!) 155/71 (!) 146/77 136/64  Pulse: 70 66 72 65  Resp: 18 17 17 18   Temp: 97.8 F (36.6 C) 98.5 F (36.9 C) 98.5 F (36.9 C) 98.4 F (36.9 C)  TempSrc: Oral Oral    SpO2: 93% 92% 92% 91%  Weight:      Height:        Intake/Output Summary (Last 24 hours) at 01/30/2022 0726 Last data filed at 01/29/2022 1919 Gross per 24 hour  Intake 240 ml  Output 500 ml  Net -260 ml    Filed Weights   01/27/22 0600 01/29/22 0500 01/29/22 1013  Weight: 98.9 kg 98.7 kg 86.2 kg    Patient BMI: Body mass index is 29.76 kg/m.   Physical Exam:  General: awake, alert, NAD HEENT: atraumatic, clear conjunctiva, anicteric sclera, MMM, hearing grossly normal Respiratory: normal respiratory effort. Cardiovascular: quick capillary refill  Nervous: A&O x3. no gross focal neurologic deficits, normal speech Extremities: moves all equally, no edema, normal tone Skin: dry, intact, normal temperature, normal color. No rashes, lesions or ulcers on exposed skin Psychiatry: normal mood, congruent affect  Labs  I have personally reviewed following labs and imaging studies CBC    Component Value Date/Time   WBC 6.6 01/29/2022 0436   RBC 3.80 (L) 01/29/2022 0436   HGB 10.6 (L) 01/29/2022 0436   HGB 14.2 09/11/2021 1413   HCT 33.2 (L) 01/29/2022 0436   HCT 43.5 09/11/2021 1413   PLT 282 01/29/2022 0436   PLT 433 09/11/2021 1413   MCV 87.4 01/29/2022 0436   MCV 89 09/11/2021 1413   MCV 89 01/11/2015 1022   MCH 27.9 01/29/2022 0436   MCHC 31.9 01/29/2022 0436   RDW 15.2 01/29/2022 0436   RDW 12.8 09/11/2021 1413   RDW 13.8 01/11/2015 1022   LYMPHSABS 2.7 01/26/2022 1349   LYMPHSABS 2.6 09/11/2021 1413   LYMPHSABS 3.4 01/11/2014 1312   MONOABS 0.6 01/26/2022 1349   MONOABS 0.7 01/11/2014 1312   EOSABS 0.1 01/26/2022 1349   EOSABS 0.2 09/11/2021 1413   EOSABS 0.3 01/11/2014 1312   BASOSABS 0.0  01/26/2022 1349   BASOSABS 0.0 09/11/2021 1413   BASOSABS 0.1 01/11/2014 1312   BMP Latest Ref Rng & Units 01/29/2022 01/27/2022 01/26/2022  Glucose 70 - 99 mg/dL - 78 107(H)  BUN 6 - 20 mg/dL - 11 16  Creatinine 0.44 - 1.00 mg/dL 0.70 0.84 0.86  BUN/Creat Ratio 9 - 23 - - -  Sodium 135 - 145 mmol/L - 138 138  Potassium 3.5 - 5.1 mmol/L - 3.3(L) 3.6  Chloride 98 - 111 mmol/L - 105 99  CO2 22 - 32 mmol/L - 28 29  Calcium 8.9 - 10.3 mg/dL - 8.5(L) 9.4   Imaging Studies  CARDIAC CATHETERIZATION  Result Date: 01/29/2022   Ost LAD to Prox LAD lesion is 85% stenosed.   Mid LAD lesion is 100% stenosed.   Dist LAD-1 lesion is 60% stenosed.   Dist LAD-2 lesion is 40% stenosed.   Ost Cx to Prox Cx lesion is 100% stenosed.   Prox RCA lesion is 95% stenosed.   Mid RCA lesion is 100% stenosed.   2nd Diag lesion is 95% stenosed.   Origin to Prox Graft lesion is 40% stenosed.   SVG and is normal in caliber.   SVG and is normal in caliber.   LIMA and is normal in caliber.   SVG and is normal in caliber.   The graft exhibits no disease.   The graft exhibits no disease.   The graft exhibits no disease.   The graft exhibits no disease. 1.  Severe underlying three-vessel coronary artery disease with patent grafts including LIMA to LAD, SVG to diagonal/OM and SVG to right PDA.  Moderate mid to distal LAD disease post LIMA anastomosis. No significant change in coronary anatomy since most recent cardiac catheterization. 2.  Left ventricular angiography was not performed.  EF was normal by echo. 3.  Mildly elevated left ventricular end-diastolic pressure of 20 mmHg. Recommendations: Elevated troponin is likely due to supply demand ischemia.  No culprit is identified.  Recommend continuing medical therapy.    Medications   Scheduled Meds:  aspirin  81 mg Oral Q breakfast   chlorproMAZINE  75 mg Oral QHS   clonazepam  0.25 mg Oral BID   clopidogrel  75 mg Oral Daily   enoxaparin (LOVENOX) injection  40 mg Subcutaneous Q24H    escitalopram  20 mg Oral Daily   ezetimibe  10 mg Oral Daily   icosapent Ethyl  2 g Oral BID   lamoTRIgine  200 mg Oral QHS  pregabalin  200 mg Oral BID   rosuvastatin  20 mg Oral Daily   No recently discontinued medications to reconcile  LOS: 4 days   Richarda Osmond, DO [facility] 01/30/2022, 7:26 AM   Available by Epic secure chat 7AM-7PM. If 7PM-7AM, please contact night-coverage Refer to [email or web address] to contact the [facility] Attending or Consulting provider for this pt

## 2022-01-30 NOTE — Progress Notes (Signed)
Occupational Therapy Treatment Patient Details Name: Erika [name] MRN: [id number] DOB: Mar 19, 1971 Today's Date: 01/30/2022   History of present illness Pt is a 51 y/o F with PMH: CVA with L sided hemiparesis of her L UE And partial paresis of her L LE, CAD s/p bypass, diastolic dysfunction, former smoker, depression, PAD and PTSD who presents to ED w/ increased L side weakness and slurred speech. Code Stroke called to care link. CT head showed no acute findings and no blood. MRI w/ No evidence of acute intracranial abnormality. Currently being treated for sepsis. Note: hospital stay 1 MA d/t AMS. Pt currently on room air.   OT comments  Pt seen for OT tx this date. Spouse present and supportive throughout. Pt performed bed mobility with MIN A + VC for safety, hooking LLE, and bed rail use to improve technique. Once seated EOB pt able to maintain static sitting balance with CGA improving to supervision for seated grooming tasks requiring set up and PRN MIN A. While seated, OT facilitated AAROM/PROM to LUE to improve comfort and ROM. Pt required MOD A for lateral scoots EOB x3 to improve positioning. Once returned to bed near end of session, LUE positioned to elevate and retrograde massage performed to L hand to minimize edema with noted mild improvement afterwards. Pt/spouse educated in LUE positioning to address edema. Pt and spouse verbalized understanding. Pt continues to benefit from skilled OT services. D/c recommendation updated to reflect increased assist required.    Recommendations for follow up therapy are one component of a multi-disciplinary discharge planning process, led by the attending physician.  Recommendations may be updated based on patient status, additional functional criteria and insurance authorization.    Follow Up Recommendations  Skilled nursing-short term rehab (<3 hours/day)    Assistance Recommended at Discharge Frequent or constant Supervision/Assistance  Patient  can return home with the following  A lot of help with walking and/or transfers;A lot of help with bathing/dressing/bathroom;Direct supervision/assist for medications management;Assist for transportation;Assistance with cooking/housework;Help with stairs or ramp for entrance   Equipment Recommendations  None recommended by OT    Recommendations for Other Services      Precautions / Restrictions Precautions Precautions: Fall Restrictions Weight Bearing Restrictions: No       Mobility Bed Mobility Overal bed mobility: Needs Assistance Bed Mobility: Supine to Sit, Sit to Supine     Supine to sit: Min assist, HOB elevated Sit to supine: Min assist   General bed mobility comments: VC to hook LLE with RLE, VC for hand placement for bed rail to improve technique    Transfers Overall transfer level: Needs assistance Equipment used: None Transfers: Bed to chair/wheelchair/BSC            Lateral/Scoot Transfers: Mod assist General transfer comment: MOD A for lateral scoots with VC for sequencing for optimal BLE positioning, x3     Balance Overall balance assessment: Needs assistance Sitting-balance support: Single extremity supported, No upper extremity supported, Feet supported Sitting balance-Leahy Scale: Fair                                     ADL either performed or assessed with clinical judgement   ADL  General ADL Comments: Pt performed grooming tasks with set up and PRN MIN A to apply deodorant, prepare dentures for cleaning/soaking    Extremity/Trunk Assessment              Vision       Perception     Praxis      Cognition Arousal/Alertness: Awake/alert Behavior During Therapy: WFL for tasks assessed/performed Overall Cognitive Status: Within Functional Limits for tasks assessed                                 General Comments: Pt required PRN VC from OT vs  spouse for safety/impulsive during session        Exercises Other Exercises Other Exercises: while seated EOB, OT facilitated AAROM/PROM LUE to provide gentle moderate stretch and to improve ROM. Other Exercises: After returned to bed, LUE positioned to elevate and retrograde massage performed to L hand to minimize edema with noted mild improvement afterwards. Pt/spouse educated in LUE positioning to address edema.    Shoulder Instructions       General Comments      Pertinent Vitals/ Pain       Pain Assessment Pain Assessment: No/denies pain  Home Living                                          Prior Functioning/Environment              Frequency  Min 2X/week        Progress Toward Goals  OT Goals(current goals can now be found in the care plan section)  Progress towards OT goals: OT to reassess next treatment  Acute Rehab OT Goals Patient Stated Goal: to go to rehab then home OT Goal Formulation: With patient/family Time For Goal Achievement: 02/10/22 Potential to Achieve Goals: Good  Plan Frequency remains appropriate;Discharge plan needs to be updated    Co-evaluation                 AM-PAC OT "6 Clicks" Daily Activity     Outcome Measure   Help from another person eating meals?: A Little Help from another person taking care of personal grooming?: A Little Help from another person toileting, which includes using toliet, bedpan, or urinal?: A Lot Help from another person bathing (including washing, rinsing, drying)?: A Lot Help from another person to put on and taking off regular upper body clothing?: A Little Help from another person to put on and taking off regular lower body clothing?: A Lot 6 Click Score: 15    End of Session Equipment Utilized During Treatment: Gait belt  OT Visit Diagnosis: Unsteadiness on feet (R26.81);Muscle weakness (generalized) (M62.81);Other (comment)   Activity Tolerance Patient tolerated  treatment well   Patient Left in bed;with call bell/phone within reach;with bed alarm set;with family/visitor present   Nurse Communication          Time: 3888-2800 OT Time Calculation (min): 30 min  Charges: OT General Charges $OT Visit: 1 Visit OT Treatments $Self Care/Home Management : 8-22 mins $Therapeutic Activity: 8-22 mins  Ardeth Perfect., MPH, MS, OTR/L ascom [phone] 01/30/22, 10:49 AM

## 2022-01-30 NOTE — TOC Progression Note (Addendum)
Transition of Care (TOC) - Progression Note  ° ° °Patient Details  °Name: Erika [name] °MRN: [id number] °Date of Birth: 11/27/1971 ° °Transition of Care (TOC) CM/SW Contact  ° M , LCSW °Phone Number: °01/30/2022, 12:05 PM ° °Clinical Narrative:    ° °CSW met with patient and husband at bedside to provide bed offers, they report choosing [facility].  ° °CSW has notified Deborah at [facility] of bed choice and started insurance authorization.  Pending insurance auth at this time.  ° °Patient is not managed by [facility], therefore deborah at [facility] has started auth today. ° °Expected Discharge Plan: Skilled Nursing Facility °Barriers to Discharge: Continued Medical Work up ° °Expected Discharge Plan and Services °Expected Discharge Plan: Skilled Nursing Facility °  °  °  °  °                °  °  °  °  °  °  °  °  °  °  ° ° °Social Determinants of Health (SDOH) Interventions °  ° °Readmission Risk Interventions °Readmission Risk Prevention Plan 12/24/2021 11/23/2021 04/28/2020  °Transportation Screening Complete Complete Complete  °PCP or Specialist Appt within 5-7 Days - - Complete  °Home Care Screening - - Complete  °Medication Review (RN CM) - - Complete  °Medication Review (RN Care Manager) Complete Complete -  °PCP or Specialist appointment within 3-5 days of discharge Complete Complete -  °HRI or Home Care Consult Complete Complete -  °SW Recovery Care/Counseling Consult - Complete -  °Palliative Care Screening - Not Applicable -  °Skilled Nursing Facility - Complete -  °Some recent data might be hidden  ° ° °

## 2022-01-30 NOTE — Progress Notes (Signed)
Progress Note  Patient Name: Erika [name] Date of Encounter: 01/30/2022  [facility] Cardiologist: [name], MD   Subjective   Cath showed patent grafts and stable anatomy.  Patient reports leg weakness, normally happens in the morning. Cath site, right groin is stable. No chest pain or SOB.   Inpatient Medications    Scheduled Meds:  aspirin EC  81 mg Oral Daily   chlorproMAZINE  75 mg Oral QHS   clonazepam  0.25 mg Oral BID   clopidogrel  75 mg Oral Daily   enoxaparin (LOVENOX) injection  40 mg Subcutaneous Q24H   escitalopram  20 mg Oral Daily   ezetimibe  10 mg Oral Daily   icosapent Ethyl  2 g Oral BID   lamoTRIgine  200 mg Oral QHS   pregabalin  200 mg Oral BID   rosuvastatin  20 mg Oral Daily   Continuous Infusions:  PRN Meds: acetaminophen **OR** acetaminophen, ondansetron **OR** ondansetron (ZOFRAN) IV, oxyCODONE, polyethylene glycol   Vital Signs    Vitals:   01/29/22 1957 01/29/22 2342 01/30/22 0530 01/30/22 0741  BP: (!) 155/71 (!) 146/77 136/64 (!) 141/74  Pulse: 66 72 65 68  Resp: 17 17 18 18   Temp: 98.5 F (36.9 C) 98.5 F (36.9 C) 98.4 F (36.9 C) 98.1 F (36.7 C)  TempSrc: Oral     SpO2: 92% 92% 91% 94%  Weight:      Height:        Intake/Output Summary (Last 24 hours) at 01/30/2022 0809 Last data filed at 01/29/2022 1919 Gross per 24 hour  Intake 240 ml  Output 500 ml  Net -260 ml   Last 3 Weights 01/29/2022 01/29/2022 01/27/2022  Weight (lbs) 190 lb 217 lb 9.5 oz 218 lb 0.6 oz  Weight (kg) 86.183 kg 98.7 kg 98.9 kg  Some encounter information is confidential and restricted. Go to Review Flowsheets activity to see all data.      Telemetry    Sr, HR 60-70 - Personally Reviewed  ECG    NO new - Personally Reviewed  Physical Exam   GEN: No acute distress.   Neck: No JVD Cardiac: RRR, no murmurs, rubs, or gallops.  Respiratory: Clear to auscultation bilaterally. GI: Soft, nontender, non-distended  MS: No edema; No  deformity. Neuro:  Nonfocal  Psych: Normal affect   Labs    High Sensitivity Troponin:   Recent Labs  Lab 01/26/22 1349 01/26/22 1733 01/26/22 2111  TROPONINIHS 2,535* 2,613* 2,196*     Chemistry Recent Labs  Lab 01/26/22 1349 01/27/22 0441 01/29/22 0436  NA 138 138  --   K 3.6 3.3*  --   CL 99 105  --   CO2 29 28  --   GLUCOSE 107* 78  --   BUN 16 11  --   CREATININE 0.86 0.84 0.70  CALCIUM 9.4 8.5*  --   PROT 7.1 5.6*  --   ALBUMIN 3.4* 2.7*  --   AST 30 21  --   ALT 16 13  --   ALKPHOS 52 46  --   BILITOT 0.5 0.3  --   GFRNONAA >60 >60 >60  ANIONGAP 10 5  --     Lipids No results for input(s): CHOL, TRIG, HDL, LABVLDL, LDLCALC, CHOLHDL in the last 168 hours.  Hematology Recent Labs  Lab 01/27/22 0441 01/28/22 0319 01/29/22 0436  WBC 9.9 8.5 6.6  RBC 3.74* 3.87 3.80*  HGB 10.3* 10.9* 10.6*  HCT 33.1*  34.2* 33.2*  MCV 88.5 88.4 87.4  MCH 27.5 28.2 27.9  MCHC 31.1 31.9 31.9  RDW 16.1* 15.5 15.2  PLT 249 269 282   Thyroid No results for input(s): TSH, FREET4 in the last 168 hours.  BNP Recent Labs  Lab 01/26/22 1733  BNP 351.5*    DDimer No results for input(s): DDIMER in the last 168 hours.   Radiology    CARDIAC CATHETERIZATION  Result Date: 01/29/2022   Ost LAD to Prox LAD lesion is 85% stenosed.   Mid LAD lesion is 100% stenosed.   Dist LAD-1 lesion is 60% stenosed.   Dist LAD-2 lesion is 40% stenosed.   Ost Cx to Prox Cx lesion is 100% stenosed.   Prox RCA lesion is 95% stenosed.   Mid RCA lesion is 100% stenosed.   2nd Diag lesion is 95% stenosed.   Origin to Prox Graft lesion is 40% stenosed.   SVG and is normal in caliber.   SVG and is normal in caliber.   LIMA and is normal in caliber.   SVG and is normal in caliber.   The graft exhibits no disease.   The graft exhibits no disease.   The graft exhibits no disease.   The graft exhibits no disease. 1.  Severe underlying three-vessel coronary artery disease with patent grafts including LIMA to  LAD, SVG to diagonal/OM and SVG to right PDA.  Moderate mid to distal LAD disease post LIMA anastomosis. No significant change in coronary anatomy since most recent cardiac catheterization. 2.  Left ventricular angiography was not performed.  EF was normal by echo. 3.  Mildly elevated left ventricular end-diastolic pressure of 20 mmHg. Recommendations: Elevated troponin is likely due to supply demand ischemia.  No culprit is identified.  Recommend continuing medical therapy.    Cardiac Studies   LHC 01/29/22   Ost LAD to Prox LAD lesion is 85% stenosed.   Mid LAD lesion is 100% stenosed.   Dist LAD-1 lesion is 60% stenosed.   Dist LAD-2 lesion is 40% stenosed.   Ost Cx to Prox Cx lesion is 100% stenosed.   Prox RCA lesion is 95% stenosed.   Mid RCA lesion is 100% stenosed.   2nd Diag lesion is 95% stenosed.   Origin to Prox Graft lesion is 40% stenosed.   SVG and is normal in caliber.   SVG and is normal in caliber.   LIMA and is normal in caliber.   SVG and is normal in caliber.   The graft exhibits no disease.   The graft exhibits no disease.   The graft exhibits no disease.   The graft exhibits no disease.   1.  Severe underlying three-vessel coronary artery disease with patent grafts including LIMA to LAD, SVG to diagonal/OM and SVG to right PDA.  Moderate mid to distal LAD disease post LIMA anastomosis. No significant change in coronary anatomy since most recent cardiac catheterization. 2.  Left ventricular angiography was not performed.  EF was normal by echo. 3.  Mildly elevated left ventricular end-diastolic pressure of 20 mmHg.   Recommendations: Elevated troponin is likely due to supply demand ischemia.  No culprit is identified.  Recommend continuing medical therapy.    Coronary Diagrams  Diagnostic Dominance: Right    TTE 01/27/2022 1. Left ventricular ejection fraction, by estimation, is 60 to 65%. The  left ventricle has normal function. The left ventricle has no  regional  wall motion abnormalities. There is mild left ventricular hypertrophy.  Left ventricular diastolic parameters  are consistent with Grade II diastolic dysfunction (pseudonormalization).   2. Right ventricular systolic function is normal. The right ventricular  size is normal.   3. The mitral valve is normal in structure. Mild mitral valve  regurgitation.   4. The aortic valve is tricuspid. Aortic valve regurgitation is not  visualized.   5. The inferior vena cava is dilated in size with <50% respiratory  variability, suggesting right atrial pressure of 15 mmHg.   Patient Profile     51 y.o. female with history of CAD/CABG x 4 (2017), right CEA, PAD(left SFA stent), hypertension, hyperlipidemia, former tobacco use presenting with left facial weakness/CVA, being seen for elevated troponins/NSTEMI.  Assessment & Plan    NSTEMI H/o CABGx4 in 2017 - HS troponin peaked at 2613 - IV heparin x48 hours completed - Echo showed preserved EF - continue Aspirin, Plavix, Zetia, Statin - cardiac cath showed patent grafts and other wise stable anatomy (report above) - can likely sign off today   H/o CVA - CT head/MRI head this admission without acute findings - continue zetia and statin   Possible CAP/Sepsis Delirium CVA rule out - suspected multifactorial due to medications, hypotension, possible CVA - imaging did not show acute CVA - abx held - PT/OP/SLP - psych and neurology signed off - back to baseline mental status  For questions or updates, please contact [facility] Please consult [email or web address] for contact info under        Signed, Teaghan Formica Ninfa Meeker, PA-C  01/30/2022, 8:09 AM

## 2022-01-30 NOTE — Progress Notes (Signed)
Physical Therapy Treatment Patient Details Name: Erika [name] MRN: [id number] DOB: [birth year] Today's Date: 01/30/2022   History of Present Illness Pt is a 51 y/o F with PMH: CVA with L sided hemiparesis of her L UE And partial paresis of her L LE, CAD s/p bypass, diastolic dysfunction, former smoker, depression, PAD and PTSD who presents to ED w/ increased L side weakness and slurred speech. Code Stroke called to care link. CT head showed no acute findings and no blood. MRI w/ No evidence of acute intracranial abnormality. Currently being treated for sepsis. Note: hospital stay 1 MA d/t AMS. Pt currently on room air.    PT Comments    Pt is making good progress towards goals with ability to demonstrate improved functional independence with OOB mobility. Able to take steps to recliner, however asked to return to bed shortly after. Able to perform sitting EOB there-ex. Reports minimal discomfort in R LE with mobility. HW in room. Will continue to progress as able.   Recommendations for follow up therapy are one component of a multi-disciplinary discharge planning process, led by the attending physician.  Recommendations may be updated based on patient status, additional functional criteria and insurance authorization.  Follow Up Recommendations  Skilled nursing-short term rehab (<3 hours/day)     Assistance Recommended at Discharge Intermittent Supervision/Assistance  Patient can return home with the following A lot of help with walking and/or transfers;A lot of help with bathing/dressing/bathroom;Assist for transportation;Help with stairs or ramp for entrance   Equipment Recommendations  None recommended by PT    Recommendations for Other Services       Precautions / Restrictions Precautions Precautions: Fall Restrictions Weight Bearing Restrictions: No     Mobility  Bed Mobility Overal bed mobility: Needs Assistance Bed Mobility: Supine to Sit, Sit to Supine     Supine  to sit: Supervision Sit to supine: Min assist   General bed mobility comments: improved technique with able to hook L LE with R LE. Once seated at EOB, upright posture noted and ability to scoot out towards EOB. With return back supine, needs slight min assist for B LE and for repositioning    Transfers Overall transfer level: Needs assistance Equipment used: Hemi-walker Transfers: Bed to chair/wheelchair/BSC Sit to Stand: Min guard   Step pivot transfers: Min assist       General transfer comment: safe technique with ability to demonstrate upright posture. Able to take steps over to recliner towards weaker side. Once seated in recliner, requests to return back to bed. 2nd transfer from chair->bed performed with improved ease going towards stronger side    Ambulation/Gait               General Gait Details: bed->chair transfer both ways using [facility]   Stairs             Wheelchair Mobility    Modified Rankin (Stroke Patients Only)       Balance Overall balance assessment: Needs assistance Sitting-balance support: Single extremity supported, No upper extremity supported, Feet supported Sitting balance-Leahy Scale: Fair     Standing balance support: Single extremity supported Standing balance-Leahy Scale: Fair                              Cognition Arousal/Alertness: Awake/alert Behavior During Therapy: WFL for tasks assessed/performed Overall Cognitive Status: Within Functional Limits for tasks assessed  General Comments: alert and pleasant this session        Exercises Other Exercises Other Exercises: seated ther-ex performed on B LE including AP, heel raises, alt. marching, and LAQ. 10 reps with supervision.    General Comments        Pertinent Vitals/Pain Pain Assessment Pain Assessment: Faces Faces Pain Scale: Hurts little more Pain Location: R LE pain in knee Pain Descriptors /  Indicators: Aching Pain Intervention(s): Limited activity within patient's tolerance, Repositioned    Home Living                          Prior Function            PT Goals (current goals can now be found in the care plan section) Acute Rehab PT Goals Patient Stated Goal: wants to return back to PLOF PT Goal Formulation: With patient Time For Goal Achievement: 02/11/22 Potential to Achieve Goals: Fair Progress towards PT goals: Progressing toward goals    Frequency    Min 2X/week      PT Plan Current plan remains appropriate    Co-evaluation              AM-PAC PT "6 Clicks" Mobility   Outcome Measure  Help needed turning from your back to your side while in a flat bed without using bedrails?: A Little Help needed moving from lying on your back to sitting on the side of a flat bed without using bedrails?: A Little Help needed moving to and from a bed to a chair (including a wheelchair)?: A Little Help needed standing up from a chair using your arms (e.g., wheelchair or bedside chair)?: A Little Help needed to walk in hospital room?: A Lot Help needed climbing 3-5 steps with a railing? : Total 6 Click Score: 15    End of Session Equipment Utilized During Treatment: Gait belt Activity Tolerance: Patient tolerated treatment well Patient left: in bed Nurse Communication: Mobility status PT Visit Diagnosis: Unsteadiness on feet (R26.81);Other abnormalities of gait and mobility (R26.89);Muscle weakness (generalized) (M62.81);History of falling (Z91.81);Difficulty in walking, not elsewhere classified (R26.2);Hemiplegia and hemiparesis Hemiplegia - Right/Left: Left     Time: 2353-6144 PT Time Calculation (min) (ACUTE ONLY): 25 min  Charges:  $Therapeutic Exercise: 23-37 mins                     Greggory Stallion, PT, DPT, GCS [phone]    Doaa Kendzierski 01/30/2022, 3:15 PM

## 2022-01-31 ENCOUNTER — Encounter: Payer: Managed Care, Other (non HMO) | Admitting: Speech Pathology

## 2022-01-31 ENCOUNTER — Ambulatory Visit: Payer: Managed Care, Other (non HMO)

## 2022-01-31 DIAGNOSIS — G9341 Metabolic encephalopathy: Secondary | ICD-10-CM | POA: Diagnosis present

## 2022-01-31 LAB — CULTURE, BLOOD (ROUTINE X 2)
Culture: NO GROWTH
Culture: NO GROWTH
Special Requests: ADEQUATE

## 2022-01-31 LAB — GLUCOSE, CAPILLARY
Glucose-Capillary: 96 mg/dL (ref 70–99)
Glucose-Capillary: 140 mg/dL — ABNORMAL HIGH (ref 70–99)
Glucose-Capillary: 173 mg/dL — ABNORMAL HIGH (ref 70–99)
Glucose-Capillary: 170 mg/dL — ABNORMAL HIGH (ref 70–99)

## 2022-01-31 MED ORDER — ISOSORBIDE MONONITRATE ER 60 MG PO TB24
60.0000 mg | ORAL_TABLET | Freq: Every day | ORAL | Status: DC
  Administered 2022-01-31 – 2022-02-01 (×2): 60 mg via ORAL
  Filled 2022-01-31 (×2): qty 1

## 2022-01-31 MED ORDER — ISOSORBIDE MONONITRATE ER 60 MG PO TB24
60.0000 mg | ORAL_TABLET | Freq: Two times a day (BID) | ORAL | Status: DC
Start: 2022-01-31 — End: 2022-01-31

## 2022-01-31 NOTE — Hospital Course (Signed)
Taken from prior notes. Erika [name] is a 51 y.o. female with a PMH significant for  severe vasculopathy with multiple complications including CAD status post CABG x4, CVA, carotid stenosis, renal artery stenosis, as well as diastolic CHF, depression/anxiety, chronic opiate and benzodiazepine use, essential hypertension, diabetes, hyperlipidemia, and chronic pain. They presented from home to the ED on 01/26/2022 with AMS. EMS was called and she was brought to ED for evaluation of suspected stroke. Her head CT/brain MRI were negative for acute stroke. Neurology was consulted for evaluation, and they later signed off.   Cardiology was also consulted due to elevated troponin for concern of NSTEMI with her history of CAD.  Received 48 hours of heparin infusion, underwent left heart catheterization on 01/29/2022 which shows patent graft and no anatomic abnormality.  It was thought to be due to demand ischemia instead of NSTEMI.   Initial concern of sepsis which was later ruled out.  Antibiotics were held.  There was some concern of pneumonia, chest x-ray consistent with post-COVID changes.   Also found to have stage II sacral pressure injury which is present on admission.   Altered mental status was thought to be due to polypharmacy, she was on several sedative and psychotropic medications.  Sedative medications were held and she will need outpatient psych evaluation for his psychotropic medicines.   PT recommending SNF, medically stable for discharge, pending insurance authorization.

## 2022-01-31 NOTE — Progress Notes (Signed)
PROGRESS NOTE    Erika [name]  MGQ:[id number] DOB: Jun 04, 1971 DOA: 01/26/2022 PCP: Lesleigh Noe, MD   Brief Narrative: Taken from prior notes. Erika [name] is a 51 y.o. female with a PMH significant for  severe vasculopathy with multiple complications including CAD status post CABG x4, CVA, carotid stenosis, renal artery stenosis, as well as diastolic CHF, depression/anxiety, chronic opiate and benzodiazepine use, essential hypertension, diabetes, hyperlipidemia, and chronic pain. They presented from home to the ED on 01/26/2022 with AMS. EMS was called and she was brought to ED for evaluation of suspected stroke. Her head CT/brain MRI were negative for acute stroke. Neurology was consulted for evaluation, and they later signed off.  Cardiology was also consulted due to elevated troponin for concern of NSTEMI with her history of CAD.  Received 48 hours of heparin infusion, underwent left heart catheterization on 01/29/2022 which shows patent graft and no anatomic abnormality.  It was thought to be due to demand ischemia instead of NSTEMI.  Initial concern of sepsis which was later ruled out.  Antibiotics were held.  There was some concern of pneumonia, chest x-ray consistent with post-COVID changes.  Also found to have stage II sacral pressure injury which is present on admission.  Altered mental status was thought to be due to polypharmacy, she was on several sedative and psychotropic medications.  Sedative medications were held and she will need outpatient psych evaluation for his psychotropic medicines.  PT recommending SNF, medically stable for discharge, pending insurance authorization.  Subjective: Patient seen and examined today.  Sitting comfortably in chair, no new complaints.  Assessment & Plan:   Principal Problem:   Sepsis ([place]) Active Problems:   HLD (hyperlipidemia)   Cluster headache   MDD (major depressive disorder)   Renal artery stenosis (HCC)   Chest  pain with high risk for cardiac etiology   NSTEMI (non-ST elevated myocardial infarction) ([place])   Essential hypertension, malignant   Type 2 diabetes mellitus with other specified complication (HCC)   CAD in native artery   CAD (coronary artery disease)   Carotid stenosis   S/P CABG x 4   PVD (peripheral vascular disease) (HCC)   Chronic pain syndrome   Long term current use of opiate analgesic   Long term prescription benzodiazepine use   Chronic anticoagulation (PLAVIX)   Chronic diastolic CHF (congestive heart failure) (HCC)   Cerebrovascular accident (CVA) due to occlusion of vertebral artery (HCC)   Major depressive disorder, recurrent episode, mild (HCC)   Pressure injury of skin   Cardiomegaly   Polypharmacy   Demand ischemia ([place])  Acute metabolic encephalopathy/delirium/polypharmacy.  CVA ruled out with imaging. Patient was on multiple sedating and psychotropic medications.  Neurology signed off. PT/OT is recommending SNF-pending insurance authorization.  Now at baseline -Continue to limit sedative medications -Will need outpatient psychiatric evaluation for psychotropic meds  Elevated troponin/CAD s/p CABG/cardiomegaly.  There was some concern of NSTEMI and cardiology was consulted.  He underwent cardiac catheterization on 01/29/2022 with patent grafts and otherwise stable anatomy.  Received heparin infusion for 48 hours which was later discontinued.  Most likely demand ischemia per cardiology. -Continue current management.  History of CVA.  No new changes on imaging. -Continue home dose of rosuvastatin and Zetia  Psych issues.  On multiple psych meds which include Thorazine, clonazepam, escitalopram, fluoxetine and Lamictal. -Home dose of clonazepam and Thorazine was decreased for concern of oversedation. -Will need outpatient follow-up for further medication assistance.  Type 2 diabetes mellitus. -  Continue with SSI  Hypertension.  Blood pressure elevated today.   Initial home dose of Imdur, torsemide, spironolactone was held due to softer blood pressure.  Carvedilol was restarted. -Restart Imdur, decrease dose to daily instead of twice daily. -Continue carvedilol -We will resume torsemide and spironolactone if needed.  GERD.  No acute concern -Home dose of PPI was discontinued.  Neuropathy. -Continue pregabalin.  Objective: Vitals:   01/30/22 2341 01/31/22 0421 01/31/22 0807 01/31/22 1140  BP: (!) 153/69 131/73 (!) 160/77 (!) 160/73  Pulse: 70 61 65 (!) 57  Resp: 18 18 18 18   Temp: 98.3 F (36.8 C) 97.6 F (36.4 C) 97.6 F (36.4 C) 97.8 F (36.6 C)  TempSrc:      SpO2: 93% 90% 96% 98%  Weight:      Height:        Intake/Output Summary (Last 24 hours) at 01/31/2022 1622 Last data filed at 01/31/2022 1338 Gross per 24 hour  Intake 760 ml  Output 0 ml  Net 760 ml   Filed Weights   01/27/22 0600 01/29/22 0500 01/29/22 1013  Weight: 98.9 kg 98.7 kg 86.2 kg    Examination:  General exam: Appears calm and comfortable  Respiratory system: Clear to auscultation. Respiratory effort normal. Cardiovascular system: S1 & S2 heard, RRR.  Gastrointestinal system: Soft, nontender, nondistended, bowel sounds positive. Central nervous system: Alert and oriented. No focal neurological deficits. Extremities: No edema, no cyanosis, pulses intact and symmetrical. Psychiatry: Judgement and insight appear normal. Mood & affect appropriate.    DVT prophylaxis: Lovenox Code Status: DNR Family Communication:  Disposition Plan:  Status is: Inpatient Remains inpatient appropriate because: Severity of illness. Currently stable for SNF, pending insurance authorization.  Level of care: Progressive  All the records are reviewed and case discussed with Care Management/Social Worker. Management plans discussed with the patient, nursing and they are in agreement.  Consultants:  Cardiology  Procedures:  Antimicrobials:   Data Reviewed: I have  personally reviewed following labs and imaging studies  CBC: Recent Labs  Lab 01/26/22 1349 01/27/22 0441 01/28/22 0319 01/29/22 0436  WBC 18.1* 9.9 8.5 6.6  NEUTROABS 14.6*  --   --   --   HGB 11.8* 10.3* 10.9* 10.6*  HCT 38.0 33.1* 34.2* 33.2*  MCV 89.8 88.5 88.4 87.4  PLT 295 249 269 518   Basic Metabolic Panel: Recent Labs  Lab 01/26/22 1349 01/27/22 0441 01/29/22 0436  NA 138 138  --   K 3.6 3.3*  --   CL 99 105  --   CO2 29 28  --   GLUCOSE 107* 78  --   BUN 16 11  --   CREATININE 0.86 0.84 0.70  CALCIUM 9.4 8.5*  --    GFR: Estimated Creatinine Clearance: 96.9 mL/min (by C-G formula based on SCr of 0.7 mg/dL). Liver Function Tests: Recent Labs  Lab 01/26/22 1349 01/27/22 0441  AST 30 21  ALT 16 13  ALKPHOS 52 46  BILITOT 0.5 0.3  PROT 7.1 5.6*  ALBUMIN 3.4* 2.7*   No results for input(s): LIPASE, AMYLASE in the last 168 hours. No results for input(s): AMMONIA in the last 168 hours. Coagulation Profile: Recent Labs  Lab 01/26/22 1349  INR 1.2   Cardiac Enzymes: No results for input(s): CKTOTAL, CKMB, CKMBINDEX, TROPONINI in the last 168 hours. BNP (last 3 results) No results for input(s): PROBNP in the last 8760 hours. HbA1C: No results for input(s): HGBA1C in the last 72 hours. CBG: Recent  Labs  Lab 01/30/22 1153 01/30/22 1551 01/30/22 2035 01/31/22 0805 01/31/22 1136  GLUCAP 161* 116* 118* 96 140*   Lipid Profile: No results for input(s): CHOL, HDL, LDLCALC, TRIG, CHOLHDL, LDLDIRECT in the last 72 hours. Thyroid Function Tests: No results for input(s): TSH, T4TOTAL, FREET4, T3FREE, THYROIDAB in the last 72 hours. Anemia Panel: No results for input(s): VITAMINB12, FOLATE, FERRITIN, TIBC, IRON, RETICCTPCT in the last 72 hours. Sepsis Labs: Recent Labs  Lab 01/26/22 1327 01/26/22 1349 01/26/22 1733 01/27/22 0441 01/28/22 0319  PROCALCITON  --  0.62  --  0.53 0.27  LATICACIDVEN 1.7  --  1.9  --   --     Recent Results (from  the past 240 hour(s))  Resp Panel by RT-PCR (Flu A&B, Covid) Nasopharyngeal Swab     Status: None   Collection Time: 01/26/22  1:22 PM   Specimen: Nasopharyngeal Swab; Nasopharyngeal(NP) swabs in vial transport medium  Result Value Ref Range Status   SARS Coronavirus 2 by RT PCR NEGATIVE NEGATIVE Final    Comment: (NOTE) SARS-CoV-2 target nucleic acids are NOT DETECTED.  The SARS-CoV-2 RNA is generally detectable in upper respiratory specimens during the acute phase of infection. The lowest concentration of SARS-CoV-2 viral copies this assay can detect is 138 copies/mL. A negative result does not preclude SARS-Cov-2 infection and should not be used as the sole basis for treatment or other patient management decisions. A negative result may occur with  improper specimen collection/handling, submission of specimen other than nasopharyngeal swab, presence of viral mutation(s) within the areas targeted by this assay, and inadequate number of viral copies(<138 copies/mL). A negative result must be combined with clinical observations, patient history, and epidemiological information. The expected result is Negative.  Fact Sheet for Patients:  EntrepreneurPulse.com.au  Fact Sheet for Healthcare Providers:  IncredibleEmployment.be  This test is no t yet approved or cleared by the [place] FDA and  has been authorized for detection and/or diagnosis of SARS-CoV-2 by FDA under an Emergency Use Authorization (EUA). This EUA will remain  in effect (meaning this test can be used) for the duration of the COVID-19 declaration under Section 564(b)(1) of the Act, 21 U.S.C.section 360bbb-3(b)(1), unless the authorization is terminated  or revoked sooner.       Influenza A by PCR NEGATIVE NEGATIVE Final   Influenza B by PCR NEGATIVE NEGATIVE Final    Comment: (NOTE) The Xpert Xpress SARS-CoV-2/FLU/RSV plus assay is intended as an aid in the diagnosis of  influenza from Nasopharyngeal swab specimens and should not be used as a sole basis for treatment. Nasal washings and aspirates are unacceptable for Xpert Xpress SARS-CoV-2/FLU/RSV testing.  Fact Sheet for Patients: EntrepreneurPulse.com.au  Fact Sheet for Healthcare Providers: IncredibleEmployment.be  This test is not yet approved or cleared by the [place] FDA and has been authorized for detection and/or diagnosis of SARS-CoV-2 by FDA under an Emergency Use Authorization (EUA). This EUA will remain in effect (meaning this test can be used) for the duration of the COVID-19 declaration under Section 564(b)(1) of the Act, 21 U.S.C. section 360bbb-3(b)(1), unless the authorization is terminated or revoked.  Performed at [facility], [address] [place], [place] [address]   Blood culture (routine x 2)     Status: None   Collection Time: 01/26/22  1:49 PM   Specimen: BLOOD  Result Value Ref Range Status   Specimen Description BLOOD BLOOD RIGHT HAND  Final   Special Requests   Final    BOTTLES  DRAWN AEROBIC AND ANAEROBIC Blood Culture adequate volume   Culture   Final    NO GROWTH 5 DAYS Performed at [facility], [address] [place], [place] [address]    Report Status 01/31/2022 FINAL  Final  Blood culture (routine x 2)     Status: None   Collection Time: 01/26/22  4:25 PM   Specimen: BLOOD  Result Value Ref Range Status   Specimen Description BLOOD BLOOD LEFT HAND  Final   Special Requests   Final    BOTTLES DRAWN AEROBIC ONLY Blood Culture results may not be optimal due to an inadequate volume of blood received in culture bottles   Culture   Final    NO GROWTH 5 DAYS Performed at [facility], [address], [place],  [address]    Report Status 01/31/2022 FINAL  Final     Radiology Studies: No results found.  Scheduled Meds:  aspirin EC  81 mg Oral Daily   carvedilol  6.25 mg Oral  BID   chlorproMAZINE  75 mg Oral QHS   clonazepam  0.25 mg Oral BID   clopidogrel  75 mg Oral Daily   enoxaparin (LOVENOX) injection  40 mg Subcutaneous Q24H   escitalopram  20 mg Oral Daily   ezetimibe  10 mg Oral Daily   icosapent Ethyl  2 g Oral BID   lamoTRIgine  200 mg Oral QHS   pregabalin  200 mg Oral BID   rosuvastatin  20 mg Oral Daily   Continuous Infusions:   LOS: 5 days   Time spent: 40 minutes. More than 50% of the time was spent in counseling/coordination of care  Lorella Nimrod, MD [facility]  If 7PM-7AM, please contact night-coverage [email or web address]  01/31/2022, 4:22 PM   This record has been created using Systems analyst. Errors have been sought and corrected,but may not always be located. Such creation errors do not reflect on the standard of care.

## 2022-01-31 NOTE — Plan of Care (Signed)
°  Problem: Activity: Goal: Ability to tolerate increased activity will improve 01/31/2022 1653 by Emmaline Life, RN Outcome: Progressing 01/31/2022 1653 by Emmaline Life, RN Outcome: Progressing

## 2022-01-31 NOTE — Plan of Care (Signed)
  Problem: Activity: Goal: Ability to tolerate increased activity will improve Outcome: Progressing   

## 2022-01-31 NOTE — Assessment & Plan Note (Signed)
CVA ruled out with imaging. Patient was on multiple sedating and psychotropic medications.  Neurology signed off. PT/OT is recommending SNF-pending insurance authorization.  Now at baseline -Continue to limit sedative medications -Will need outpatient psychiatric evaluation for psychotropic meds

## 2022-01-31 NOTE — Assessment & Plan Note (Deleted)
There was some concern of NSTEMI and cardiology was consulted.  He underwent cardiac catheterization on 01/29/2022 with patent grafts and otherwise stable anatomy.  Received heparin infusion for 48 hours which was later discontinued.  Most likely demand ischemia per cardiology. -Continue current management.

## 2022-01-31 NOTE — TOC Progression Note (Signed)
Transition of Care [facility]) - Progression Note    Patient Details  Name: Erika [name] MRN: [id number] Date of Birth: 1971/01/24  Transition of Care [facility]) CM/SW [place], [place] Phone Number: 01/31/2022, 3:16 PM  Clinical Narrative:     Per Neoma Laming at [place] is still pending at this time.   Expected Discharge Plan: Skilled Nursing Facility Barriers to Discharge: Continued Medical Work up  Expected Discharge Plan and Services Expected Discharge Plan: [place]                                               Social Determinants of Health (SDOH) Interventions    Readmission Risk Interventions Readmission Risk Prevention Plan 12/24/2021 11/23/2021 04/28/2020  Transportation Screening Complete Complete Complete  PCP or Specialist Appt within 5-7 Days - - Complete  Home Care Screening - - Complete  Medication Review (RN CM) - - Complete  Medication Review Press photographer) Complete Complete -  PCP or Specialist appointment within 3-5 days of discharge Complete Complete -  [place] or Home Care Consult Complete Complete -  SW Recovery Care/Counseling Consult - Complete -  Palliative Care Screening - Not Applicable -  [place] - Complete -  Some recent data might be hidden

## 2022-01-31 NOTE — Progress Notes (Signed)
Occupational Therapy Treatment Patient Details Name: Erika [name] MRN: [id number] DOB: 06/18/1971 Today's Date: 01/31/2022   History of present illness Pt is a 51 y/o F with PMH: CVA with L sided hemiparesis of her L UE And partial paresis of her L LE, CAD s/p bypass, diastolic dysfunction, former smoker, depression, PAD and PTSD who presents to ED w/ increased L side weakness and slurred speech. Code Stroke called to care link. CT head showed no acute findings and no blood. MRI w/ No evidence of acute intracranial abnormality. Currently being treated for sepsis. Note: hospital stay 1 MA d/t AMS. Pt currently on room air.   OT comments  Upon entering the room, pt supine in bed with no c/o pain. Pt's person cell phone begins ringing and she picks up hospital call bell instead and pressed call bell to "answer phone". Pt needing cuing to scan to L to locate cell phone. Per pt's request, focus on hemiplegic dressing techniques. Clothing introduced to pt on the L side to encourage L visual scanning/attention to L side. Mod multimodal cuing for technique. Min A for for UB dressing with hand over hand assistance to thread shirt over L UE. Pt needing assistance with figure four position to thread pants into L LE. Pt standing with min A and use of hemiwalker with therapist assisting pt will pulling pants over B hips. Pt performed step pivot transfer to recliner chair with hemiwalker towards the R. Pt seated in recliner chair with chair alarm activated and L UE prompted for protection. Pt continues to benefit from OT intervention with recommendation for short term rehab to address functional deficits.    Recommendations for follow up therapy are one component of a multi-disciplinary discharge planning process, led by the attending physician.  Recommendations may be updated based on patient status, additional functional criteria and insurance authorization.    Follow Up Recommendations  Skilled  nursing-short term rehab (<3 hours/day)    Assistance Recommended at Discharge Frequent or constant Supervision/Assistance  Patient can return home with the following  A lot of help with walking and/or transfers;A lot of help with bathing/dressing/bathroom;Direct supervision/assist for medications management;Assist for transportation;Assistance with cooking/housework;Help with stairs or ramp for entrance   Equipment Recommendations  None recommended by OT       Precautions / Restrictions Precautions Precautions: Fall Precaution Comments: Pt known to PT (seen prior in outpatient). Pt with decreased postural stability/balance. PT provided close CGA-min a with standing and ambulatory activities. Restrictions Weight Bearing Restrictions: No       Mobility Bed Mobility Overal bed mobility: Needs Assistance Bed Mobility: Supine to Sit, Sit to Supine     Supine to sit: Min assist          Transfers Overall transfer level: Needs assistance Equipment used: Hemi-walker Transfers: Bed to chair/wheelchair/BSC, Sit to/from Stand Sit to Stand: Min assist     Step pivot transfers: Min assist           Balance Overall balance assessment: Needs assistance Sitting-balance support: Single extremity supported, No upper extremity supported, Feet supported Sitting balance-Leahy Scale: Fair     Standing balance support: Single extremity supported Standing balance-Leahy Scale: Fair Standing balance comment: CGA to sustain static standing, MIN A to sustain with taking steps.                           ADL either performed or assessed with clinical judgement   ADL Overall ADL's :  Needs assistance/impaired                 Upper Body Dressing : Minimal assistance;Cueing for sequencing;Cueing for compensatory techniques;Sitting   Lower Body Dressing: Moderate assistance;Sit to/from stand                       Vision Patient Visual Report: No change from  baseline            Cognition Arousal/Alertness: Awake/alert Behavior During Therapy: WFL for tasks assessed/performed Overall Cognitive Status: Within Functional Limits for tasks assessed                                 General Comments: alert and pleasant this session                   Pertinent Vitals/ Pain       Pain Assessment Pain Assessment: No/denies pain         Frequency  Min 3X/week        Progress Toward Goals  OT Goals(current goals can now be found in the care plan section)  Progress towards OT goals: Progressing toward goals  Acute Rehab OT Goals Patient Stated Goal: to go to rehab and then home OT Goal Formulation: With patient/family Time For Goal Achievement: 02/10/22 Potential to Achieve Goals: Good  Plan Discharge plan needs to be updated;Frequency needs to be updated       AM-PAC OT "6 Clicks" Daily Activity     Outcome Measure   Help from another person eating meals?: A Little Help from another person taking care of personal grooming?: A Little Help from another person toileting, which includes using toliet, bedpan, or urinal?: A Lot Help from another person bathing (including washing, rinsing, drying)?: A Lot Help from another person to put on and taking off regular upper body clothing?: A Little Help from another person to put on and taking off regular lower body clothing?: A Lot 6 Click Score: 15    End of Session    OT Visit Diagnosis: Unsteadiness on feet (R26.81);Muscle weakness (generalized) (M62.81);Other (comment)   Activity Tolerance Patient tolerated treatment well   Patient Left in bed;with call bell/phone within reach;with bed alarm set;with family/visitor present   Nurse Communication Mobility status        Time: 1962-2297 OT Time Calculation (min): 26 min  Charges: OT General Charges $OT Visit: 1 Visit OT Treatments $Self Care/Home Management : 23-37 mins  Darleen Crocker, MS, OTR/L ,  CBIS ascom [phone]  01/31/22, 12:44 PM

## 2022-02-01 DIAGNOSIS — E1169 Type 2 diabetes mellitus with other specified complication: Secondary | ICD-10-CM | POA: Diagnosis not present

## 2022-02-01 DIAGNOSIS — E1159 Type 2 diabetes mellitus with other circulatory complications: Secondary | ICD-10-CM | POA: Diagnosis not present

## 2022-02-01 DIAGNOSIS — G9341 Metabolic encephalopathy: Secondary | ICD-10-CM | POA: Diagnosis not present

## 2022-02-01 DIAGNOSIS — F331 Major depressive disorder, recurrent, moderate: Secondary | ICD-10-CM | POA: Diagnosis not present

## 2022-02-01 DIAGNOSIS — R2689 Other abnormalities of gait and mobility: Secondary | ICD-10-CM | POA: Diagnosis not present

## 2022-02-01 DIAGNOSIS — I5022 Chronic systolic (congestive) heart failure: Secondary | ICD-10-CM | POA: Diagnosis not present

## 2022-02-01 DIAGNOSIS — E7849 Other hyperlipidemia: Secondary | ICD-10-CM | POA: Diagnosis not present

## 2022-02-01 DIAGNOSIS — I6529 Occlusion and stenosis of unspecified carotid artery: Secondary | ICD-10-CM | POA: Diagnosis not present

## 2022-02-01 DIAGNOSIS — F4310 Post-traumatic stress disorder, unspecified: Secondary | ICD-10-CM | POA: Diagnosis not present

## 2022-02-01 DIAGNOSIS — M6281 Muscle weakness (generalized): Secondary | ICD-10-CM | POA: Diagnosis not present

## 2022-02-01 DIAGNOSIS — S8252XD Displaced fracture of medial malleolus of left tibia, subsequent encounter for closed fracture with routine healing: Secondary | ICD-10-CM | POA: Diagnosis not present

## 2022-02-01 DIAGNOSIS — F339 Major depressive disorder, recurrent, unspecified: Secondary | ICD-10-CM | POA: Diagnosis not present

## 2022-02-01 DIAGNOSIS — I739 Peripheral vascular disease, unspecified: Secondary | ICD-10-CM | POA: Diagnosis not present

## 2022-02-01 DIAGNOSIS — I69398 Other sequelae of cerebral infarction: Secondary | ICD-10-CM | POA: Diagnosis not present

## 2022-02-01 DIAGNOSIS — I2510 Atherosclerotic heart disease of native coronary artery without angina pectoris: Secondary | ICD-10-CM | POA: Diagnosis not present

## 2022-02-01 DIAGNOSIS — I208 Other forms of angina pectoris: Secondary | ICD-10-CM | POA: Diagnosis not present

## 2022-02-01 DIAGNOSIS — I110 Hypertensive heart disease with heart failure: Secondary | ICD-10-CM | POA: Diagnosis not present

## 2022-02-01 LAB — GLUCOSE, CAPILLARY
Glucose-Capillary: 116 mg/dL — ABNORMAL HIGH (ref 70–99)
Glucose-Capillary: 175 mg/dL — ABNORMAL HIGH (ref 70–99)

## 2022-02-01 LAB — CBC
HCT: 35 % — ABNORMAL LOW (ref 36.0–46.0)
Hemoglobin: 11 g/dL — ABNORMAL LOW (ref 12.0–15.0)
MCH: 27.8 pg (ref 26.0–34.0)
MCHC: 31.4 g/dL (ref 30.0–36.0)
MCV: 88.6 fL (ref 80.0–100.0)
Platelets: 309 10*3/uL (ref 150–400)
RBC: 3.95 MIL/uL (ref 3.87–5.11)
RDW: 15.5 % (ref 11.5–15.5)
WBC: 8.7 10*3/uL (ref 4.0–10.5)
nRBC: 0 % (ref 0.0–0.2)

## 2022-02-01 MED ORDER — CHLORPROMAZINE HCL 50 MG PO TABS: 75.0000 mg | ORAL_TABLET | Freq: Every day | ORAL | Status: DC

## 2022-02-01 MED ORDER — CLONAZEPAM 0.25 MG PO TBDP: 0.2500 mg | ORAL_TABLET | Freq: Two times a day (BID) | ORAL | 0 refills | Status: DC

## 2022-02-01 MED ORDER — PREGABALIN 200 MG PO CAPS: 200.0000 mg | ORAL_CAPSULE | Freq: Two times a day (BID) | ORAL | Status: DC

## 2022-02-01 MED ORDER — ISOSORBIDE MONONITRATE ER 60 MG PO TB24: 60.0000 mg | ORAL_TABLET | Freq: Every day | ORAL | 2 refills | Status: DC

## 2022-02-01 MED ORDER — CHLORPROMAZINE HCL 25 MG PO TABS: 75.0000 mg | ORAL_TABLET | Freq: Every day | ORAL | Status: DC

## 2022-02-01 NOTE — Discharge Summary (Signed)
Physician Discharge Summary   Patient: Erika [name] MRN: [id number] DOB: 24-Apr-1971  Admit date:     01/26/2022  Discharge date: 02/01/22  Discharge Physician: Lorella Nimrod   PCP: Lesleigh Noe, MD   Recommendations at discharge:  Patient need follow-up with primary care provider and psychiatrist to review her psych meds. Please avoid sedating medications.  Discharge Diagnoses: Principal Problem:   Acute metabolic encephalopathy/delirium/polypharmacy Active Problems:   Elevated troponin I level   Cerebrovascular accident (CVA) due to occlusion of vertebral artery (HCC)   Major depressive disorder, recurrent episode, mild (HCC)   Type 2 diabetes mellitus with other specified complication (HCC)   Essential hypertension   GERD (gastroesophageal reflux disease)   Neuropathy   HLD (hyperlipidemia)   Cluster headache   MDD (major depressive disorder)   Renal artery stenosis (HCC)   Chest pain with high risk for cardiac etiology   NSTEMI (non-ST elevated myocardial infarction) ([place])   Essential hypertension, malignant   CAD in native artery   CAD (coronary artery disease)   Carotid stenosis   S/P CABG x 4   PVD (peripheral vascular disease) (HCC)   Chronic pain syndrome   Long term current use of opiate analgesic   Long term prescription benzodiazepine use   Chronic anticoagulation (PLAVIX)   Chronic diastolic CHF (congestive heart failure) (HCC)   Sepsis (HCC)   Pressure injury of skin   Cardiomegaly   Polypharmacy   Demand ischemia [facility])    Hospital Course: Taken from prior notes. Erika [name] is a 51 y.o. female with a PMH significant for  severe vasculopathy with multiple complications including CAD status post CABG x4, CVA, carotid stenosis, renal artery stenosis, as well as diastolic CHF, depression/anxiety, chronic opiate and benzodiazepine use, essential hypertension, diabetes, hyperlipidemia, and chronic pain. They presented from home to the ED on  01/26/2022 with AMS. EMS was called and she was brought to ED for evaluation of suspected stroke. Her head CT/brain MRI were negative for acute stroke. Neurology was consulted for evaluation, and they later signed off.   Cardiology was also consulted due to elevated troponin for concern of NSTEMI with her history of CAD.  Received 48 hours of heparin infusion, underwent left heart catheterization on 01/29/2022 which shows patent graft and no anatomic abnormality.  It was thought to be due to demand ischemia instead of NSTEMI.   Initial concern of sepsis which was later ruled out.  Antibiotics were held.  There was some concern of pneumonia, chest x-ray consistent with post-COVID changes.   Also found to have stage II sacral pressure injury which is present on admission.   Altered mental status was thought to be due to polypharmacy, she was on several sedative and psychotropic medications.  Sedative medications were held and she will need outpatient psych evaluation for his psychotropic medicines.   PT recommending SNF, medically stable for discharge, pending insurance authorization.  Assessment and Plan: * Acute metabolic encephalopathy/delirium/polypharmacy- (present on admission)  CVA ruled out with imaging. Patient was on multiple sedating and psychotropic medications.  Neurology signed off. PT/OT is recommending SNF-pending insurance authorization.  Now at baseline -Continue to limit sedative medications -Will need outpatient psychiatric evaluation for psychotropic meds  Elevated troponin/CAD s/p CABG/cardiomegaly.  There was some concern of NSTEMI and cardiology was consulted.  He underwent cardiac catheterization on 01/29/2022 with patent grafts and otherwise stable anatomy.  Received heparin infusion for 48 hours which was later discontinued.  Most likely demand ischemia per cardiology. -  Continue current management.   History of CVA.  No new changes on imaging. -Continue home dose of  rosuvastatin and Zetia  Psych issues.  On multiple psych meds which include Thorazine, clonazepam, escitalopram, fluoxetine and Lamictal. -Home dose of clonazepam and Thorazine was decreased for concern of oversedation. -Will need outpatient follow-up for further medication assistance.   Type 2 diabetes mellitus. -Continue with SSI   Hypertension.  Blood pressure elevated today.  Initial home dose of Imdur, torsemide, spironolactone was held due to softer blood pressure.  Her antihypertensives were resumed slowly and her PCP can monitor and add agents as needed.  Currently holding torsemide and spironolactone as she appears euvolemic.    GERD.  No acute concern -Home dose of PPI was discontinued.   Neuropathy. -Continue pregabalin.  Consultants: Cardiology Procedures performed: None Disposition: Skilled nursing facility Diet recommendation:  Discharge Diet Orders (From admission, onward)     Start     Ordered   02/01/22 0000  Diet - low sodium heart healthy        02/01/22 1236           Cardiac and Carb modified diet  DISCHARGE MEDICATION: Allergies as of 02/01/2022       Reactions   Chantix [varenicline Tartrate] Other (See Comments)   Feels "crazy" and angry        Medication List     STOP taking these medications    benztropine 0.5 MG tablet Commonly known as: COGENTIN   clonazePAM 0.5 MG tablet Commonly known as: KlonoPIN Replaced by: clonazePAM 0.25 MG disintegrating tablet   cyclobenzaprine 10 MG tablet Commonly known as: FLEXERIL   diclofenac Sodium 1 % Gel Commonly known as: VOLTAREN   FLUoxetine HCl 60 MG Tabs   pantoprazole 40 MG tablet Commonly known as: PROTONIX   potassium chloride 20 MEQ packet Commonly known as: KLOR-CON   spironolactone 25 MG tablet Commonly known as: ALDACTONE   torsemide 10 MG tablet Commonly known as: DEMADEX   traZODone 50 MG tablet Commonly known as: DESYREL   zinc sulfate 220 (50 Zn) MG capsule        TAKE these medications    acetaminophen 325 MG tablet Commonly known as: TYLENOL Take 1-2 tablets (325-650 mg total) by mouth every 4 (four) hours as needed for mild pain. What changed: how much to take   albuterol 108 (90 Base) MCG/ACT inhaler Commonly known as: VENTOLIN HFA Inhale 2 puffs into the lungs every 6 (six) hours as needed for wheezing.   ascorbic acid 500 MG tablet Commonly known as: VITAMIN C Take 1 tablet (500 mg total) by mouth daily.   aspirin 81 MG chewable tablet Chew 1 tablet (81 mg total) by mouth daily with breakfast.   carvedilol 6.25 MG tablet Commonly known as: COREG Take 1 tablet (6.25 mg total) by mouth 2 (two) times daily.   chlorproMAZINE 25 MG tablet Commonly known as: THORAZINE Take 3 tablets (75 mg total) by mouth at bedtime. What changed:  medication strength how much to take how to take this when to take this additional instructions   clonazePAM 0.25 MG disintegrating tablet Commonly known as: KLONOPIN Take 1 tablet (0.25 mg total) by mouth 2 (two) times daily. Replaces: clonazePAM 0.5 MG tablet   clopidogrel 75 MG tablet Commonly known as: PLAVIX TAKE ONE TABLET BY MOUTH ONE TIME DAILY   cyanocobalamin 1000 MCG tablet Take 1 tablet (1,000 mcg total) by mouth daily. What changed: Another medication with the same name  was removed. Continue taking this medication, and follow the directions you see here.   escitalopram 20 MG tablet Commonly known as: Lexapro Take 1 tablet (20 mg total) by mouth daily.   ezetimibe 10 MG tablet Commonly known as: ZETIA TAKE ONE TABLET BY MOUTH ONE TIME DAILY   HumaLOG Junior KwikPen 100 UNIT/ML Generic drug: Insulin lispro Inject 0-10 Units into the skin 3 (three) times daily. insulin Lispro (Humalog) injection 0-10 Units 0-10 Units Subcutaneous, 3 times daily with meals CBG < 70: Implement Hypoglycemia Standing Orders and refer to Hypoglycemia Standing Orders sidebar report  CBG 70 - 120: 0 unit  CBG 121 - 150: 0 unit  CBG 151 - 200: 1 unit CBG 201 - 250: 2 units CBG 251 - 300: 4 units CBG 301 - 350: 6 units  CBG 351 - 400: 8 units  CBG > 400: 10 units   icosapent Ethyl 1 g capsule Commonly known as: VASCEPA Take 2 g by mouth 2 (two) times daily.   isosorbide mononitrate 60 MG 24 hr tablet Commonly known as: IMDUR Take 1 tablet (60 mg total) by mouth daily. What changed: when to take this   lamoTRIgine 200 MG tablet Commonly known as: LAMICTAL Take 1 tablet (200 mg total) by mouth at bedtime.   Levemir FlexTouch 100 UNIT/ML FlexPen Generic drug: insulin detemir Inject 20 Units into the skin at bedtime. What changed:  how much to take when to take this   nitroGLYCERIN 0.4 MG SL tablet Commonly known as: NITROSTAT Place 1 tablet (0.4 mg total) under the tongue every 5 (five) minutes as needed for chest pain.   polyethylene glycol powder 17 GM/SCOOP powder Commonly known as: GLYCOLAX/MIRALAX MIX 1 CAPFUL IN 8 OUNCES OF FLUID BY MOUTH TWICE A DAY   pregabalin 200 MG capsule Commonly known as: Lyrica Take 1 capsule (200 mg total) by mouth 2 (two) times daily. What changed:  medication strength how much to take   Repatha SureClick 962 MG/ML Soaj Generic drug: Evolocumab Inject 1 Dose into the skin every 14 (fourteen) days.   rosuvastatin 20 MG tablet Commonly known as: CRESTOR Take 1 tablet (20 mg total) by mouth daily.   senna-docusate 8.6-50 MG tablet Commonly known as: Senokot-S Take 2 tablets by mouth at bedtime.   Trulicity 2.29 NL/8.9QJ Sopn Generic drug: Dulaglutide Inject 0.75 mg into the skin once a week. What changed: when to take this        Follow-up Information     Lesleigh Noe, MD. Schedule an appointment as soon as possible for a visit in 1 week(s).   Specialty: Family Medicine Contact information: [place] [address] [phone]         Wellington Hampshire, MD .   Specialty: Cardiology Contact  information: [address]  [place] [address] [phone]                 Discharge Exam: Danley Danker Weights   01/27/22 0600 01/29/22 0500 01/29/22 1013  Weight: 98.9 kg 98.7 kg 86.2 kg   General.     In no acute distress. Pulmonary.  Lungs clear bilaterally, normal respiratory effort. CV.  Regular rate and rhythm, no JVD, rub or murmur. Abdomen.  Soft, nontender, nondistended, BS positive. CNS.  Alert and oriented x3.  Left upper extremity 3/5 and left lower extremity 4/5. Extremities.  No edema, no cyanosis, pulses intact and symmetrical. Psychiatry.  Judgment and insight appears normal.   Condition  at discharge: stable  The results of significant diagnostics from this hospitalization (including imaging, microbiology, ancillary and laboratory) are listed below for reference.   Imaging Studies: MR BRAIN WO CONTRAST  Result Date: 01/26/2022 CLINICAL DATA:  Provided history: Left-sided weakness. Possible stroke. EXAM: MRI HEAD WITHOUT CONTRAST TECHNIQUE: Multiplanar, multiecho pulse sequences of the brain and surrounding structures were obtained without intravenous contrast. COMPARISON:  Noncontrast head CT and CT angiogram head/neck performed earlier today 01/26/2022. brain MRI 11/23/2021. FINDINGS: Brain: Mild intermittent motion degradation. Cerebral volume appears normal for age. Redemonstrated chronic cortical/subcortical infarcts within the right parietal and occipital lobes, with associated cortical laminar necrosis and possible chronic blood products at these sites. Multifocal T2 FLAIR hyperintense signal abnormality elsewhere within the cerebral white matter, nonspecific but compatible with age advanced chronic small vessel ischemic disease. Redemonstrated chronic lacunar infarcts within the left basal ganglia and left thalamus. Unchanged chronic infarct within the right pons. As before, there is background extensive T2 FLAIR hyperintense signal abnormality  within the pons and bilateral middle cerebellar peduncles, also likely reflecting chronic small vessel ischemia. There are a few scattered punctate supratentorial chronic parenchymal microhemorrhages. There is no acute infarct. No evidence of an intracranial mass. No extra-axial fluid collection. No midline shift. Vascular: Intracranial arterial vasculature more fully characterized on the same-day CT angiogram head/neck. Skull and upper cervical spine: No focal suspicious marrow lesion. Sinuses/Orbits: Visualized orbits show no acute finding. Mild mucosal thickening within the bilateral ethmoid sinuses. IMPRESSION: No evidence of acute intracranial abnormality. Stable non-contrast MRI appearance of the brain as compared to 11/23/2021. Chronic cortical/subcortical right PCA territory infarcts within the right parietal and occipital lobes. Age-advanced chronic small vessel ischemic changes within the cerebral white matter. Chronic lacunar infarcts within the left basal ganglia and left thalamus. Chronic infarct within the right pons. Background advanced chronic small vessel ischemic changes within the pons and bilateral middle cerebellar peduncles. Mild mucosal thickening within the bilateral ethmoid sinuses. Electronically Signed   By: Kellie Simmering D.O.   On: 01/26/2022 18:44   CARDIAC CATHETERIZATION  Result Date: 01/29/2022   Ost LAD to Prox LAD lesion is 85% stenosed.   Mid LAD lesion is 100% stenosed.   Dist LAD-1 lesion is 60% stenosed.   Dist LAD-2 lesion is 40% stenosed.   Ost Cx to Prox Cx lesion is 100% stenosed.   Prox RCA lesion is 95% stenosed.   Mid RCA lesion is 100% stenosed.   2nd Diag lesion is 95% stenosed.   Origin to Prox Graft lesion is 40% stenosed.   SVG and is normal in caliber.   SVG and is normal in caliber.   LIMA and is normal in caliber.   SVG and is normal in caliber.   The graft exhibits no disease.   The graft exhibits no disease.   The graft exhibits no disease.   The graft  exhibits no disease. 1.  Severe underlying three-vessel coronary artery disease with patent grafts including LIMA to LAD, SVG to diagonal/OM and SVG to right PDA.  Moderate mid to distal LAD disease post LIMA anastomosis. No significant change in coronary anatomy since most recent cardiac catheterization. 2.  Left ventricular angiography was not performed.  EF was normal by echo. 3.  Mildly elevated left ventricular end-diastolic pressure of 20 mmHg. Recommendations: Elevated troponin is likely due to supply demand ischemia.  No culprit is identified.  Recommend continuing medical therapy.   Portable chest 1 View  Result Date: 01/27/2022 CLINICAL DATA:  51 year old  female with recent COVID-19. Weakness. Prior CABG. EXAM: PORTABLE CHEST 1 VIEW COMPARISON:  Portable chest 01/26/2022 and earlier. FINDINGS: Portable AP upright view at 0813 hours. Improved lung volumes, with more normal cardiac size and mediastinal contour now stable to that in December. Prior CABG. Visualized tracheal air column is within normal limits. Bilateral infrahilar streaky lung opacity is acute. No pneumothorax, pleural effusion, pulmonary edema or consolidation. Negative visible bowel gas. No acute osseous abnormality identified. IMPRESSION: Improved lung volumes. Bilateral streaky bilateral lower lung opacity could be atelectasis or COVID-19 pneumonia in this setting. No pleural effusion. Prior CABG, heart size within normal limits and stable since December. Electronically Signed   By: Genevie Ann M.D.   On: 01/27/2022 08:33   DG Chest Portable 1 View  Result Date: 01/26/2022 CLINICAL DATA:  hypoxic, eval infiltrate EXAM: PORTABLE CHEST 1 VIEW COMPARISON:  CTA from the same day. Chest radiograph November 21, 2021. FINDINGS: Low lung volumes. Left basilar opacities. No visible pleural effusions or pneumothorax. Enlarged cardiac silhouette, accentuated by low lung volumes and AP technique. CABG and median sternotomy. Superior most two median  sternotomy wires are fractured. IMPRESSION: 1. Left basilar opacities, concerning for pneumonia. 2. Cardiomegaly. Electronically Signed   By: Margaretha Sheffield M.D.   On: 01/26/2022 13:54   ECHOCARDIOGRAM COMPLETE  Result Date: 01/27/2022    ECHOCARDIOGRAM REPORT   Patient Name:   RAWAN RIENDEAU Date of Exam: 01/27/2022 Medical Rec #:  [id number]           Height:       68.0 in Accession #:    [id number]          Weight:       218.0 lb Date of Birth:  1971/03/11          BSA:          2.120 m Patient Age:    73 years            BP:           104/52 mmHg Patient Gender: F                   HR:           68 bpm. Exam Location:  [facility] Procedure: 2D Echo and Intracardiac Opacification Agent Indications:     CAD Native Vessel I25.10  History:         Patient has prior history of Echocardiogram examinations, most                  recent 06/25/2021.  Sonographer:     Kathlen Brunswick RDCS Referring Phys:  [id number] [place] M ECKSTAT Diagnosing Phys: Kate Sable MD  Sonographer Comments: Suboptimal apical window and suboptimal subcostal window. Image acquisition challenging due to patient body habitus. IMPRESSIONS  1. Left ventricular ejection fraction, by estimation, is 60 to 65%. The left ventricle has normal function. The left ventricle has no regional wall motion abnormalities. There is mild left ventricular hypertrophy. Left ventricular diastolic parameters are consistent with Grade II diastolic dysfunction (pseudonormalization).  2. Right ventricular systolic function is normal. The right ventricular size is normal.  3. The mitral valve is normal in structure. Mild mitral valve regurgitation.  4. The aortic valve is tricuspid. Aortic valve regurgitation is not visualized.  5. The inferior vena cava is dilated in size with <50% respiratory variability, suggesting right atrial pressure of 15 mmHg. FINDINGS  Left Ventricle: Left ventricular ejection fraction, by estimation, is 60  to 65%. The left ventricle has  normal function. The left ventricle has no regional wall motion abnormalities. Definity contrast agent was given IV to delineate the left ventricular  endocardial borders. The left ventricular internal cavity size was normal in size. There is mild left ventricular hypertrophy. Left ventricular diastolic parameters are consistent with Grade II diastolic dysfunction (pseudonormalization). Right Ventricle: The right ventricular size is normal. No increase in right ventricular wall thickness. Right ventricular systolic function is normal. Left Atrium: Left atrial size was normal in size. Right Atrium: Right atrial size was normal in size. Pericardium: There is no evidence of pericardial effusion. Mitral Valve: The mitral valve is normal in structure. Mild mitral valve regurgitation. Tricuspid Valve: The tricuspid valve is normal in structure. Tricuspid valve regurgitation is trivial. Aortic Valve: The aortic valve is tricuspid. Aortic valve regurgitation is not visualized. Aortic valve peak gradient measures 8.6 mmHg. Pulmonic Valve: The pulmonic valve was normal in structure. Pulmonic valve regurgitation is not visualized. Aorta: The aortic root is normal in size and structure. Venous: The inferior vena cava is dilated in size with less than 50% respiratory variability, suggesting right atrial pressure of 15 mmHg. IAS/Shunts: No atrial level shunt detected by color flow Doppler.  LEFT VENTRICLE PLAX 2D LVIDd:         4.80 cm     Diastology LVIDs:         3.20 cm     LV e' medial:    4.57 cm/s LV PW:         1.30 cm     LV E/e' medial:  22.8 LV IVS:        1.20 cm     LV e' lateral:   6.64 cm/s LVOT diam:     1.90 cm     LV E/e' lateral: 15.7 LV SV:         62 LV SV Index:   29 LVOT Area:     2.84 cm  LV Volumes (MOD) LV vol d, MOD A2C: 77.1 ml LV vol d, MOD A4C: 86.8 ml LV vol s, MOD A2C: 23.6 ml LV vol s, MOD A4C: 14.7 ml LV SV MOD A2C:     53.5 ml LV SV MOD A4C:     86.8 ml LV SV MOD BP:      63.0 ml RIGHT VENTRICLE  RV Basal diam:  3.50 cm RV S prime:     11.00 cm/s TAPSE (M-mode): 1.8 cm LEFT ATRIUM             Index        RIGHT ATRIUM           Index LA diam:        3.30 cm 1.56 cm/m   RA Area:     12.60 cm LA Vol (A2C):   28.6 ml 13.49 ml/m  RA Volume:   31.40 ml  14.81 ml/m LA Vol (A4C):   49.3 ml 23.25 ml/m LA Biplane Vol: 39.9 ml 18.82 ml/m  AORTIC VALVE                 PULMONIC VALVE AV Area (Vmax): 2.30 cm     PV Vmax:       1.03 m/s AV Vmax:        147.00 cm/s  PV Peak grad:  4.2 mmHg AV Peak Grad:   8.6 mmHg LVOT Vmax:      119.00 cm/s LVOT Vmean:     78.500 cm/s LVOT  VTI:       0.218 m  AORTA Ao Root diam: 2.90 cm MITRAL VALVE MV Area (PHT): 4.15 cm     SHUNTS MV Decel Time: 183 msec     Systemic VTI:  0.22 m MV E velocity: 104.00 cm/s  Systemic Diam: 1.90 cm MV A velocity: 63.00 cm/s MV E/A ratio:  1.65 Kate Sable MD Electronically signed by Kate Sable MD Signature Date/Time: 01/27/2022/1:04:59 PM    Final    CT HEAD CODE STROKE WO CONTRAST  Result Date: 01/26/2022 CLINICAL DATA:  Code stroke.  Left arm weakness and facial droop EXAM: CT HEAD WITHOUT CONTRAST TECHNIQUE: Contiguous axial images were obtained from the base of the skull through the vertex without intravenous contrast. RADIATION DOSE REDUCTION: This exam was performed according to the departmental dose-optimization program which includes automated exposure control, adjustment of the mA and/or kV according to patient size and/or use of iterative reconstruction technique. COMPARISON:  Brain MRI 11/23/2021, CT head 09/08/2021 FINDINGS: Brain: There is no evidence of acute intracranial hemorrhage, extra-axial fluid collection, or acute infarct. Background parenchymal volume is normal. The ventricles are normal in size. Remote infarcts in the right parietal and occipital lobes are again noted with hyperdensity overlying the right occipital infarct consistent with cortical laminar necrosis. A remote infarct in the right pons is also  again seen. There is no mass lesion.  There is no midline shift. Vascular: No dense vessel is seen. There is calcification of the bilateral cavernous ICAs. Skull: Normal. Negative for fracture or focal lesion. Sinuses/Orbits: Imaged paranasal sinuses are clear. The globes and orbits are unremarkable. Other: None. ASPECTS [facility] Stroke Program Early CT Score) - Ganglionic level infarction (caudate, lentiform nuclei, internal capsule, insula, M1-M3 cortex): 7 - Supraganglionic infarction (M4-M6 cortex): 3 Total score (0-10 with 10 being normal): 10 IMPRESSION: 1. No acute intracranial hemorrhage or infarct. 2. ASPECTS is 10 3. Remote infarcts as above. These results were called by telephone at the time of interpretation on 01/26/2022 at 1:02 pm to provider Dr Quinn Axe, who verbally acknowledged these results. Electronically Signed   By: Valetta Mole M.D.   On: 01/26/2022 13:05   CT ANGIO HEAD NECK W WO CM W PERF (CODE STROKE)  Result Date: 01/26/2022 CLINICAL DATA:  Left facial droop and arm weakness EXAM: CT ANGIOGRAPHY HEAD AND NECK CT PERFUSION BRAIN TECHNIQUE: Multidetector CT imaging of the head and neck was performed using the standard protocol during bolus administration of intravenous contrast. Multiplanar CT image reconstructions and MIPs were obtained to evaluate the vascular anatomy. Carotid stenosis measurements (when applicable) are obtained utilizing NASCET criteria, using the distal internal carotid diameter as the denominator. Multiphase CT imaging of the brain was performed following IV bolus contrast injection. Subsequent parametric perfusion maps were calculated using RAPID software. RADIATION DOSE REDUCTION: This exam was performed according to the departmental dose-optimization program which includes automated exposure control, adjustment of the mA and/or kV according to patient size and/or use of iterative reconstruction technique. CONTRAST:  168mL OMNIPAQUE IOHEXOL 350 MG/ML SOLN COMPARISON:   None. Same-day noncontrast head CT, brain MRI 11/23/2021, CTA head/neck 06/24/2021 FINDINGS: CTA NECK FINDINGS Aortic arch: There is calcified atherosclerotic plaque of the aortic arch. The origins of the major branch vessels are patent. Subclavian arteries are patent. There is mixed plaque at the origin of the right brachiocephalic artery without hemodynamically significant stenosis. Right carotid system: There is soft plaque throughout the right common carotid artery resulting in up to approximately 30% stenosis. There  is primarily soft plaque in the right carotid bulb resulting in up to approximally 20% stenosis. The distal right internal carotid artery is patent. The right external carotid artery is occluded at its origin for a short-segment with distal reconstitution, unchanged. There is no dissection or aneurysm. Left carotid system: There is scattered soft and calcified plaque in the left common carotid artery without hemodynamically significant stenosis or occlusion. There is mixed plaque in the proximal left common carotid artery resulting in up to approximately 30% stenosis. The distal left internal carotid artery is patent. The left external carotid artery is patent. There is no evidence of dissection or aneurysm. Vertebral arteries: The right vertebral artery is patent in the neck. The left vertebral artery is occluded from its origin through the mid V2 segment, with reconstitution of flow at the C3-C4 level. This is unchanged compared to the prior study from 06/24/2021. The remainder of the left vertebral artery is patent in the neck. Skeleton: There is no acute osseous abnormality or aggressive osseous lesion. There is no visible canal hematoma. Other neck: Soft tissues are unremarkable. Upper chest: There is patchy ground-glass opacity in the superior segment of the left lower lobe, incompletely imaged. Review of the MIP images confirms the above findings CTA HEAD FINDINGS Anterior circulation: There  is calcified atherosclerotic plaque in the bilateral intracranial ICAs resulting in up to moderate stenosis bilaterally. The bilateral MCAs are patent with mild atherosclerotic irregularity. There is no proximal high-grade stenosis or occlusion. The bilateral ACAs are patent with mild atherosclerotic irregularity. There is no proximal high-grade stenosis or occlusion. There is no aneurysm or AVM. Posterior circulation: The right V4 segment is patent but with multifocal atherosclerotic irregularity resulting in up to mild-to-moderate stenosis (7-239). PICA is identified on the right. There is mixed plaque in the proximal left V4 segment resulting in moderate to severe stenosis. The left vertebral artery is not identified beyond the PICA origin, unchanged. There is multifocal irregularity and narrowing of the basilar artery with up to moderate stenosis proximally (8-108). The basilar artery remains patent to its tip. The superior cerebellar arteries are patent. There is a fetal origin of the bilateral PCAs. There is irregularity of the bilateral P1 segments resulting in up to mild-to-moderate stenosis on the left (8-90). Otherwise, the PCAs are patent with mild multifocal irregularity and narrowing. There is no aneurysm or AVM. Venous sinuses: Patent. Anatomic variants: None. Review of the MIP images confirms the above findings CT Brain Perfusion Findings: ASPECTS: 10 CBF (<30%) Volume: 56mL Perfusion (Tmax>6.0s) volume: 30mL Mismatch Volume: 61mL Infarction Location:No infarct identified. The area of ischemia is in the left anterior temporal lobe. IMPRESSION: 1. No infarct core identified. 5 cc of ischemia was identified in the left anterior temporal lobe which may be artifactual. Recommend correlation with MRI. 2. No emergent large vessel occlusion. 3. Atherosclerotic plaque in the bilateral carotid systems resulting in up to 30% stenosis in the right common carotid artery and 20% stenosis in the right internal carotid  artery, and 30% stenosis of the left internal carotid artery. Findings are not significantly changed. 4. 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 There is significant atherosclerotic disease in the intracranial left V4 segment resulting in up to moderate to severe stenosis, and the V4 segment is not identified beyond the PICA origin, unchanged. The right vertebral artery is patent with up to mild-to-moderate stenosis. 5. Moderate stenosis of the bilateral intracranial ICAs and proximal basilar artery.  No proximal high-grade stenosis or occlusion in the intracranial vasculature. 6. Unchanged occlusion of the right proximal external carotid artery with distal reconstitution. 7. Patchy ground-glass opacity in the left lower lobe may be infectious or inflammatory in nature, incompletely evaluated. Consider dedicated imaging of the chest as indicated. These results were paged to Dr. Quinn Axe via [employer] at 1:43 p.m. Electronically Signed   By: Valetta Mole M.D.   On: 01/26/2022 13:46    Microbiology: Results for orders placed or performed during the hospital encounter of 01/26/22  Resp Panel by RT-PCR (Flu A&B, Covid) Nasopharyngeal Swab     Status: None   Collection Time: 01/26/22  1:22 PM   Specimen: Nasopharyngeal Swab; Nasopharyngeal(NP) swabs in vial transport medium  Result Value Ref Range Status   SARS Coronavirus 2 by RT PCR NEGATIVE NEGATIVE Final    Comment: (NOTE) SARS-CoV-2 target nucleic acids are NOT DETECTED.  The SARS-CoV-2 RNA is generally detectable in upper respiratory specimens during the acute phase of infection. The lowest concentration of SARS-CoV-2 viral copies this assay can detect is 138 copies/mL. A negative result does not preclude SARS-Cov-2 infection and should not be used as the sole basis for treatment or other patient management decisions. A negative result may occur with  improper specimen collection/handling, submission  of specimen other than nasopharyngeal swab, presence of viral mutation(s) within the areas targeted by this assay, and inadequate number of viral copies(<138 copies/mL). A negative result must be combined with clinical observations, patient history, and epidemiological information. The expected result is Negative.  Fact Sheet for Patients:  EntrepreneurPulse.com.au  Fact Sheet for Healthcare Providers:  IncredibleEmployment.be  This test is no t yet approved or cleared by the [place] FDA and  has been authorized for detection and/or diagnosis of SARS-CoV-2 by FDA under an Emergency Use Authorization (EUA). This EUA will remain  in effect (meaning this test can be used) for the duration of the COVID-19 declaration under Section 564(b)(1) of the Act, 21 U.S.C.section 360bbb-3(b)(1), unless the authorization is terminated  or revoked sooner.       Influenza A by PCR NEGATIVE NEGATIVE Final   Influenza B by PCR NEGATIVE NEGATIVE Final    Comment: (NOTE) The Xpert Xpress SARS-CoV-2/FLU/RSV plus assay is intended as an aid in the diagnosis of influenza from Nasopharyngeal swab specimens and should not be used as a sole basis for treatment. Nasal washings and aspirates are unacceptable for Xpert Xpress SARS-CoV-2/FLU/RSV testing.  Fact Sheet for Patients: EntrepreneurPulse.com.au  Fact Sheet for Healthcare Providers: IncredibleEmployment.be  This test is not yet approved or cleared by the [place] FDA and has been authorized for detection and/or diagnosis of SARS-CoV-2 by FDA under an Emergency Use Authorization (EUA). This EUA will remain in effect (meaning this test can be used) for the duration of the COVID-19 declaration under Section 564(b)(1) of the Act, 21 U.S.C. section 360bbb-3(b)(1), unless the authorization is terminated or revoked.  Performed at [facility], [address] [place], [place] [address]   Blood culture (routine x 2)     Status: None   Collection Time: 01/26/22  1:49 PM   Specimen: BLOOD  Result Value Ref Range Status   Specimen Description BLOOD BLOOD RIGHT HAND  Final   Special Requests   Final    BOTTLES DRAWN AEROBIC AND ANAEROBIC Blood Culture adequate volume   Culture   Final    NO GROWTH 5 DAYS Performed at [facility], [place], [place]  [address]    Report Status 01/31/2022 FINAL  Final  Blood culture (routine x 2)     Status: None   Collection Time: 01/26/22  4:25 PM   Specimen: BLOOD  Result Value Ref Range Status   Specimen Description BLOOD BLOOD LEFT HAND  Final   Special Requests   Final    BOTTLES DRAWN AEROBIC ONLY Blood Culture results may not be optimal due to an inadequate volume of blood received in culture bottles   Culture   Final    NO GROWTH 5 DAYS Performed at [facility], [address] [place],  [address]    Report Status 01/31/2022 FINAL  Final   *Note: Due to a large number of results and/or encounters for the requested time period, some results have not been displayed. A complete set of results can be found in Results Review.    Labs: CBC: Recent Labs  Lab 01/26/22 1349 01/27/22 0441 01/28/22 0319 01/29/22 0436 02/01/22 0549  WBC 18.1* 9.9 8.5 6.6 8.7  NEUTROABS 14.6*  --   --   --   --   HGB 11.8* 10.3* 10.9* 10.6* 11.0*  HCT 38.0 33.1* 34.2* 33.2* 35.0*  MCV 89.8 88.5 88.4 87.4 88.6  PLT 295 249 269 282 827   Basic Metabolic Panel: Recent Labs  Lab 01/26/22 1349 01/27/22 0441 01/29/22 0436  NA 138 138  --   K 3.6 3.3*  --   CL 99 105  --   CO2 29 28  --   GLUCOSE 107* 78  --   BUN 16 11  --   CREATININE 0.86 0.84 0.70  CALCIUM 9.4 8.5*  --    Liver Function Tests: Recent Labs  Lab 01/26/22 1349 01/27/22 0441  AST 30 21  ALT 16 13  ALKPHOS 52 46  BILITOT 0.5 0.3  PROT 7.1 5.6*  ALBUMIN 3.4* 2.7*   CBG: Recent Labs  Lab  01/31/22 1136 01/31/22 1643 01/31/22 2103 02/01/22 0752 02/01/22 1149  GLUCAP 140* 173* 170* 116* 175*    Discharge time spent: greater than 30 minutes.  Signed: Lorella Nimrod, MD [facility] 02/01/2022

## 2022-02-01 NOTE — Progress Notes (Signed)
Patient d/c [facility], report called to Reggie LPN. Transported by ems, discharge packet sent. Family aware of discharge plan patient had no c/o pain or shortness of breath at d/c.  Caliana Spires, Tivis Ringer, RN

## 2022-02-01 NOTE — Plan of Care (Signed)
°  Problem: Activity: Goal: Ability to tolerate increased activity will improve 02/01/2022 1612 by Emmaline Life, RN Outcome: Adequate for Discharge 02/01/2022 1450 by Emmaline Life, RN Outcome: Progressing   Problem: Clinical Measurements: Goal: Ability to maintain a body temperature in the normal range will improve 02/01/2022 1612 by Emmaline Life, RN Outcome: Adequate for Discharge 02/01/2022 1450 by Emmaline Life, RN Outcome: Progressing   Problem: Respiratory: Goal: Ability to maintain adequate ventilation will improve Outcome: Adequate for Discharge Goal: Ability to maintain a clear airway will improve Outcome: Adequate for Discharge   Problem: Education: Goal: Knowledge of General Education information will improve Description: Including pain rating scale, medication(s)/side effects and non-pharmacologic comfort measures Outcome: Adequate for Discharge   Problem: Health Behavior/Discharge Planning: Goal: Ability to manage health-related needs will improve Outcome: Adequate for Discharge   Problem: Clinical Measurements: Goal: Ability to maintain clinical measurements within normal limits will improve Outcome: Adequate for Discharge Goal: Will remain free from infection Outcome: Adequate for Discharge Goal: Diagnostic test results will improve Outcome: Adequate for Discharge Goal: Respiratory complications will improve Outcome: Adequate for Discharge Goal: Cardiovascular complication will be avoided Outcome: Adequate for Discharge   Problem: Activity: Goal: Risk for activity intolerance will decrease Outcome: Adequate for Discharge   Problem: Elimination: Goal: Will not experience complications related to bowel motility Outcome: Adequate for Discharge Goal: Will not experience complications related to urinary retention Outcome: Adequate for Discharge   Problem: Pain Managment: Goal: General experience of comfort will improve Outcome: Adequate for  Discharge   Problem: Safety: Goal: Ability to remain free from injury will improve Outcome: Adequate for Discharge   Problem: Skin Integrity: Goal: Risk for impaired skin integrity will decrease Outcome: Adequate for Discharge   Problem: Acute Rehab OT Goals (only OT should resolve) Goal: Pt. Will Perform Upper Body Dressing Outcome: Adequate for Discharge Goal: Pt. Will Perform Lower Body Dressing Outcome: Adequate for Discharge Goal: Pt. Will Transfer To Toilet Outcome: Adequate for Discharge Goal: Pt. Will Perform Toileting-Clothing Manipulation Outcome: Adequate for Discharge   Problem: Acute Rehab PT Goals(only PT should resolve) Goal: Pt Will Go Supine/Side To Sit Outcome: Adequate for Discharge Goal: Pt Will Ambulate Outcome: Adequate for Discharge   Problem: Education: Goal: Understanding of CV disease, CV risk reduction, and recovery process will improve Outcome: Adequate for Discharge Goal: Individualized Educational Video(s) Outcome: Adequate for Discharge   Problem: Activity: Goal: Ability to return to baseline activity level will improve Outcome: Adequate for Discharge   Problem: Cardiovascular: Goal: Ability to achieve and maintain adequate cardiovascular perfusion will improve Outcome: Adequate for Discharge Goal: Vascular access site(s) Level 0-1 will be maintained Outcome: Adequate for Discharge   Problem: Health Behavior/Discharge Planning: Goal: Ability to safely manage health-related needs after discharge will improve Outcome: Adequate for Discharge

## 2022-02-01 NOTE — Progress Notes (Signed)
°   02/01/22 1300  Clinical Encounter Type  Visited With Patient  Visit Type Follow-up  Referral From Chaplain  Consult/Referral To Chaplain   Chaplain met with patient and dicussed emotions regarding discharge planning.Patient was sitting in chair and reports feeling positive and hopeful. Chaplain provided compassionate presence and reflective listening.

## 2022-02-01 NOTE — Progress Notes (Signed)
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 Code Stroke called to care link. CT head showed no acute findings and no blood. MRI w/ No evidence of acute intracranial abnormality. Currently being treated for sepsis. Note: hospital stay 1 MA d/t AMS. Pt currently on room air.    PT Comments    Pt is making good progress towards goals with tolerance for sitting in recliner this date. Still struggles to stand from low surface, however once standing, follows commands for step pivot transfer to recliner. Good endurance with B UE/LE there-ex. Will continue to progress. Pt very hopeful for dc this date to SNF.   Recommendations for follow up therapy are one component of a multi-disciplinary discharge planning process, led by the attending physician.  Recommendations may be updated based on patient status, additional functional criteria and insurance authorization.  Follow Up Recommendations  Skilled nursing-short term rehab (<3 hours/day)     Assistance Recommended at Discharge Intermittent Supervision/Assistance  Patient can return home with the following A lot of help with walking and/or transfers;A lot of help with bathing/dressing/bathroom;Assist for transportation;Help with stairs or ramp for entrance   Equipment Recommendations  None recommended by PT    Recommendations for Other Services       Precautions / Restrictions Precautions Precautions: Fall Restrictions Weight Bearing Restrictions: No     Mobility  Bed Mobility Overal bed mobility: Needs Assistance Bed Mobility: Supine to Sit, Sit to Supine     Supine to sit: Min  assist     General bed mobility comments: needs slight assist for upper body. Cues for scooting out towards EOB    Transfers Overall transfer level: Needs assistance Equipment used: Hemi-walker Transfers: Bed to chair/wheelchair/BSC, Sit to/from Stand Sit to Stand: Mod assist   Step pivot transfers: Min assist       General transfer comment: several attempts required for successful standing. Unable to stand from lower surface, however with bed elevated 2 inches was able to stand with mod assist and HW. Step transfer to recliner with cues for sequencing    Ambulation/Gait               General Gait Details: bed->chair transfer with step pivot   Stairs             Wheelchair Mobility    Modified Rankin (Stroke Patients Only)       Balance Overall balance assessment: Needs assistance Sitting-balance support: Single extremity supported, No upper extremity supported, Feet supported Sitting balance-Leahy Scale: Fair     Standing balance support: Single extremity supported Standing balance-Leahy Scale: Fair                              Cognition Arousal/Alertness: Awake/alert Behavior During Therapy: WFL for tasks assessed/performed Overall Cognitive Status: Within Functional Limits for tasks assessed                                 General Comments: pleasant        Exercises Other Exercises Other  Exercises: seated ther-ex performed on B LE/UE including LAQ, alt marching, hip add squeezes, scap squeezes, R UE bicep curls with weight, shoulder flexion with R hand on L wrist, and modified abdominal sit ups from back of chair (using R UE). 12 reps with supervision    General Comments        Pertinent Vitals/Pain Pain Assessment Pain Assessment: No/denies pain    Home Living                          Prior Function            PT Goals (current goals can now be found in the care plan section) Acute Rehab PT  Goals Patient Stated Goal: wants to return back to PLOF PT Goal Formulation: With patient Time For Goal Achievement: 02/11/22 Potential to Achieve Goals: Fair Progress towards PT goals: Progressing toward goals    Frequency    Min 2X/week      PT Plan Current plan remains appropriate    Co-evaluation              AM-PAC PT "6 Clicks" Mobility   Outcome Measure  Help needed turning from your back to your side while in a flat bed without using bedrails?: A Little Help needed moving from lying on your back to sitting on the side of a flat bed without using bedrails?: A Little Help needed moving to and from a bed to a chair (including a wheelchair)?: A Little Help needed standing up from a chair using your arms (e.g., wheelchair or bedside chair)?: A Lot Help needed to walk in hospital room?: A Lot Help needed climbing 3-5 steps with a railing? : Total 6 Click Score: 14    End of Session Equipment Utilized During Treatment: Gait belt Activity Tolerance: Patient tolerated treatment well Patient left: in chair Nurse Communication: Mobility status PT Visit Diagnosis: Unsteadiness on feet (R26.81);Other abnormalities of gait and mobility (R26.89);Muscle weakness (generalized) (M62.81);History of falling (Z91.81);Difficulty in walking, not elsewhere classified (R26.2);Hemiplegia and hemiparesis Hemiplegia - Right/Left: Left     Time: 9030-0923 PT Time Calculation (min) (ACUTE ONLY): 23 min  Charges:  $Therapeutic Exercise: 8-22 mins $Therapeutic Activity: 8-22 mins                     Greggory Stallion, PT, DPT, GCS [phone]    Erika [name] 02/01/2022, 11:52 AM

## 2022-02-01 NOTE — TOC Transition Note (Addendum)
Transition of Care [facility]) - CM/SW Discharge Note   Patient Details  Name: Erika [name] MRN: [id number] Date of Birth: [birth year]  Transition of Care [facility] CM/SW Contact:  Alberteen Sam, LCSW Phone Number: 02/01/2022, 12:58 PM   Clinical Narrative:     Patient will DC to: [place] Anticipated DC date: 02/01/22 Family notified: husband Transport by: [facility]  Per MD patient ready for DC to [facility] . RN, patient, patient's family, and facility notified of DC. Discharge Summary sent to facility. RN given number for report   [phone] Room 317. DC packet on chart. Ambulance transport requested for patient.  CSW signing off.  Pricilla Riffle, LCSW    Final next level of care: Skilled Nursing Facility Barriers to Discharge: No Barriers Identified   Patient Goals and CMS Choice Patient states their goals for this hospitalization and ongoing recovery are:: to go home CMS Medicare.gov Compare Post Acute Care list provided to:: Patient Choice offered to / list presented to : Patient  Discharge Placement              Patient chooses bed at: [facility] Patient to be transferred to facility by: husband Name of family member notified: husband Patient and family notified of of transfer: 02/01/22  Discharge Plan and Services                                     Social Determinants of Health (SDOH) Interventions     Readmission Risk Interventions Readmission Risk Prevention Plan 12/24/2021 11/23/2021 04/28/2020  Transportation Screening Complete Complete Complete  PCP or Specialist Appt within 5-7 Days - - Complete  Home Care Screening - - Complete  Medication Review (RN CM) - - Complete  Medication Review Press photographer) Complete Complete -  PCP or Specialist appointment within 3-5 days of discharge Complete Complete -  [place] or Home Care Consult Complete Complete -  SW Recovery Care/Counseling Consult - Complete -  Palliative Care  Screening - Not Applicable -  [place] - Complete -  Some recent data might be hidden

## 2022-02-01 NOTE — Plan of Care (Signed)
  Problem: Activity: Goal: Ability to tolerate increased activity will improve Outcome: Progressing   Problem: Clinical Measurements: Goal: Ability to maintain a body temperature in the normal range will improve Outcome: Progressing   

## 2022-02-01 NOTE — Plan of Care (Signed)
°  Problem: Activity: Goal: Ability to tolerate increased activity will improve Outcome: Progressing   Problem: Clinical Measurements: Goal: Ability to maintain a body temperature in the normal range will improve Outcome: Progressing   Problem: Respiratory: Goal: Ability to maintain adequate ventilation will improve Outcome: Progressing Goal: Ability to maintain a clear airway will improve Outcome: Progressing   Problem: Education: Goal: Knowledge of General Education information will improve Description: Including pain rating scale, medication(s)/side effects and non-pharmacologic comfort measures Outcome: Progressing   Problem: Health Behavior/Discharge Planning: Goal: Ability to manage health-related needs will improve Outcome: Progressing   Problem: Clinical Measurements: Goal: Ability to maintain clinical measurements within normal limits will improve Outcome: Progressing Goal: Will remain free from infection Outcome: Progressing Goal: Diagnostic test results will improve Outcome: Progressing Goal: Respiratory complications will improve Outcome: Progressing Goal: Cardiovascular complication will be avoided Outcome: Progressing   Problem: Activity: Goal: Risk for activity intolerance will decrease Outcome: Progressing   Problem: Elimination: Goal: Will not experience complications related to bowel motility Outcome: Progressing Goal: Will not experience complications related to urinary retention Outcome: Progressing   Problem: Pain Managment: Goal: General experience of comfort will improve Outcome: Progressing   Problem: Safety: Goal: Ability to remain free from injury will improve Outcome: Progressing   Problem: Skin Integrity: Goal: Risk for impaired skin integrity will decrease Outcome: Progressing   Problem: Education: Goal: Understanding of CV disease, CV risk reduction, and recovery process will improve Outcome: Progressing Goal: Individualized  Educational Video(s) Outcome: Progressing   Problem: Activity: Goal: Ability to return to baseline activity level will improve Outcome: Progressing   Problem: Cardiovascular: Goal: Ability to achieve and maintain adequate cardiovascular perfusion will improve Outcome: Progressing Goal: Vascular access site(s) Level 0-1 will be maintained Outcome: Progressing   Problem: Health Behavior/Discharge Planning: Goal: Ability to safely manage health-related needs after discharge will improve Outcome: Progressing

## 2022-02-02 ENCOUNTER — Encounter: Payer: Self-pay | Admitting: Family Medicine

## 2022-02-02 DIAGNOSIS — I110 Hypertensive heart disease with heart failure: Secondary | ICD-10-CM | POA: Diagnosis not present

## 2022-02-02 DIAGNOSIS — F339 Major depressive disorder, recurrent, unspecified: Secondary | ICD-10-CM | POA: Diagnosis not present

## 2022-02-02 DIAGNOSIS — I208 Other forms of angina pectoris: Secondary | ICD-10-CM | POA: Diagnosis not present

## 2022-02-02 DIAGNOSIS — E1159 Type 2 diabetes mellitus with other circulatory complications: Secondary | ICD-10-CM | POA: Diagnosis not present

## 2022-02-02 LAB — SARS CORONAVIRUS 2 (TAT 6-24 HRS): SARS Coronavirus 2: POSITIVE — AB

## 2022-02-05 ENCOUNTER — Ambulatory Visit: Payer: BC Managed Care – PPO

## 2022-02-05 ENCOUNTER — Ambulatory Visit: Payer: BC Managed Care – PPO | Admitting: Speech Pathology

## 2022-02-06 ENCOUNTER — Encounter: Payer: BC Managed Care – PPO | Admitting: Physical Medicine and Rehabilitation

## 2022-02-07 ENCOUNTER — Encounter: Payer: Managed Care, Other (non HMO) | Admitting: Speech Pathology

## 2022-02-07 ENCOUNTER — Ambulatory Visit: Payer: Managed Care, Other (non HMO)

## 2022-02-11 ENCOUNTER — Other Ambulatory Visit: Payer: Self-pay | Admitting: Physical Medicine and Rehabilitation

## 2022-02-12 ENCOUNTER — Ambulatory Visit: Payer: Managed Care, Other (non HMO)

## 2022-02-12 ENCOUNTER — Encounter: Payer: Managed Care, Other (non HMO) | Admitting: Speech Pathology

## 2022-02-13 ENCOUNTER — Ambulatory Visit (INDEPENDENT_AMBULATORY_CARE_PROVIDER_SITE_OTHER): Payer: BC Managed Care – PPO | Admitting: Medical

## 2022-02-13 ENCOUNTER — Encounter: Payer: Self-pay | Admitting: Medical

## 2022-02-13 ENCOUNTER — Encounter (HOSPITAL_COMMUNITY): Payer: Self-pay | Admitting: Psychiatry

## 2022-02-13 ENCOUNTER — Other Ambulatory Visit: Payer: Self-pay

## 2022-02-13 ENCOUNTER — Telehealth (HOSPITAL_BASED_OUTPATIENT_CLINIC_OR_DEPARTMENT_OTHER): Payer: BC Managed Care – PPO | Admitting: Psychiatry

## 2022-02-13 VITALS — BP 120/80 | HR 81 | Ht 66.5 in | Wt 205.0 lb

## 2022-02-13 VITALS — Wt 200.0 lb

## 2022-02-13 DIAGNOSIS — F331 Major depressive disorder, recurrent, moderate: Secondary | ICD-10-CM | POA: Diagnosis not present

## 2022-02-13 DIAGNOSIS — E7849 Other hyperlipidemia: Secondary | ICD-10-CM | POA: Diagnosis not present

## 2022-02-13 DIAGNOSIS — I2510 Atherosclerotic heart disease of native coronary artery without angina pectoris: Secondary | ICD-10-CM

## 2022-02-13 DIAGNOSIS — I739 Peripheral vascular disease, unspecified: Secondary | ICD-10-CM

## 2022-02-13 DIAGNOSIS — I6529 Occlusion and stenosis of unspecified carotid artery: Secondary | ICD-10-CM

## 2022-02-13 DIAGNOSIS — F4310 Post-traumatic stress disorder, unspecified: Secondary | ICD-10-CM | POA: Diagnosis not present

## 2022-02-13 MED ORDER — ESCITALOPRAM OXALATE 20 MG PO TABS: 20.0000 mg | ORAL_TABLET | Freq: Every day | ORAL | 1 refills | Status: DC

## 2022-02-13 MED ORDER — LAMOTRIGINE 200 MG PO TABS: 200.0000 mg | ORAL_TABLET | Freq: Every day | ORAL | 1 refills | Status: DC

## 2022-02-13 MED ORDER — REPATHA SURECLICK 140 MG/ML ~~LOC~~ SOAJ: 1.0000 | SUBCUTANEOUS | 0 refills | Status: DC

## 2022-02-13 MED ORDER — CLOPIDOGREL BISULFATE 75 MG PO TABS: 75.0000 mg | ORAL_TABLET | Freq: Every day | ORAL | 0 refills | Status: DC

## 2022-02-13 MED ORDER — CHLORPROMAZINE HCL 25 MG PO TABS: 75.0000 mg | ORAL_TABLET | Freq: Every day | ORAL | 1 refills | Status: DC

## 2022-02-13 MED ORDER — EZETIMIBE 10 MG PO TABS: 10.0000 mg | ORAL_TABLET | Freq: Every day | ORAL | 0 refills | Status: DC

## 2022-02-13 MED ORDER — CARVEDILOL 6.25 MG PO TABS: 6.2500 mg | ORAL_TABLET | Freq: Two times a day (BID) | ORAL | 0 refills | Status: DC

## 2022-02-13 MED ORDER — CLONAZEPAM 0.25 MG PO TBDP: 0.2500 mg | ORAL_TABLET | Freq: Two times a day (BID) | ORAL | 1 refills | Status: DC
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
## 2022-02-13 NOTE — Progress Notes (Signed)
Cardiology Office Note:    Date:  02/13/2022   ID:  Erika [name], DOB Dec 16, 1971, MRN [id number]  PCP:  Lesleigh Noe, MD  [facility] Cardiologist:  [name], MD  [place] Electrophysiologist:  None   Referring MD: Lesleigh Noe, MD   Chief Complaint: Hospital follow-up  History of Present Illness:    Erika [name] is a 50 y.o. female with a hx of with history of CAD/CABG x 4 (2017), right CEA, PAD(left SFA stent), hypertension, PAD s/p left SFA stent, hyperlipidemia, former tobacco use who was recently admitted with left facial weakness/CVA, being seen for elevated troponins/NSTEMI.  She has known history of coronary artery disease with previous non-ST elevation myocardial infarction in 2017.  She was found to have three-vessel coronary artery disease at that time and underwent CABG and right carotid endarterectomy. She was rehospitalized in September, 2017 with chest pain in the setting of uncontrolled hypertension. Cardiac catheterization showed patent grafts . The LAD had diffuse disease distal to the anastomosis and was very small in caliber. Ejection fraction was normal with mildly elevated left ventricular end-diastolic pressure. She underwent repeat cardiac catheterization in September 2021 due to worsening angina.  Cardiac cath showed progression of native coronary artery disease but patent grafts with no obstructive disease.  Medical therapy was recommended.    Hospitalized in July 2022 for acute stroke and elevated troponin. Echo showed normal LCSF with no significant valvular abnormality.   Admitted early February for AMS found to have NSTEMI and elevated troponin. Cath showed patent grafts and otherwise stable anatomy. Echo showed preserved EF. She was treated with IV heparin. CT/MRI head without acute findings. Suspected AMS multifactorial due to medications, hypotension. She was discharged to SNF.   Today, the cardiac cath and echo were  reviewed. She is at a nursing home. She is doing PT, hopefully she is out his weekend. She is making good progress.  She is reporting claudication in the right leg. She will see neurology next month. No chest pain or SOB, LLE, orthopnea, pnd.    Past Medical History:  Diagnosis Date   Arthralgia of temporomandibular joint    CAD, multiple vessel    a. 06/2016 Cath: ostLM 40%, ostLAD 40%, pLAD 95%, ost-pLCx 60%, pLCx 95%, mLCx 60%, mRCA 95%, D2 50%, LVSF nl;  b. 07/2016 CABG x 4 (LIMA->LAD, VG->Diag, VG->OM, VG->RCA); c. 08/2016 Cath: 3VD w/ 4/4 patent grafts. LAD distal to LIMA has diff dzs->Med rx; d. 08/2020 Cath: 4/4 patent grafts, native 3VD. EF 55-65%-->Med Rx.   Carotid arterial disease ([place])    a. 07/2016 s/p R CEA; b. 02/2021 U/S: RICA 75-64%, LICA 3-32%.   Clotting disorder ([place])    Depression    Diastolic dysfunction    a. 06/2016 Echo: EF 50-55%, mild inf wall HK, GR1DD, mild MR, RV sys fxn nl, mildly dilated LA, PASP nl; b. 06/2021 Echo: EF 60-65%, no rwma. Nl RV fxn.   Fatty liver disease, nonalcoholic 9518   History of blood transfusion    with heart surgery   HLD (hyperlipidemia)    Labile hypertension    a. prior renal ngiogram negative for RAS in 03/2016; b. catecholamines and metanephrines normal, mildly elevated renin with normal aldosterone and normal ratio in 02/2016   Myocardial infarction [facility]) 2017   Obesity    PAD (peripheral artery disease) ([place])    a. 09/2018 s/p L SFA stenting; b. 07/2019 Periph Angio: Patent m/d L SFA stent w/ 100% L SFA  distal to stent. L AT 100d, L Peroneal diff dzs-->Med Rx; c. 02/2021 ABIs: stable @ 0.61 on R and 0.46 on L.   PTSD (post-traumatic stress disorder)    Tobacco abuse    Type 2 diabetes mellitus ([place]) 12/2015    Past Surgical History:  Procedure Laterality Date   ABDOMINAL AORTOGRAM W/LOWER EXTREMITY [name] 10/15/2018   Procedure: ABDOMINAL AORTOGRAM W/LOWER EXTREMITY;  Surgeon: Wellington Hampshire, MD;  Location: [place] [facility];   Service: Cardiovascular;  Laterality: [name];   ABDOMINAL AORTOGRAM W/LOWER EXTREMITY Bilateral 08/19/2019   Procedure: ABDOMINAL AORTOGRAM W/LOWER EXTREMITY;  Surgeon: Wellington Hampshire, MD;  Location: [place] [facility];  Service: Cardiovascular;  Laterality: Bilateral;   CARDIAC CATHETERIZATION [name] 06/29/2016   Procedure: Left Heart Cath and Coronary Angiography;  Surgeon: Minna Merritts, MD;  Location: [place] [facility];  Service: Cardiovascular;  Laterality: [name];   CARDIAC CATHETERIZATION [name] 08/29/2016   Procedure: Left Heart Cath and Cors/Grafts Angiography;  Surgeon: Wellington Hampshire, MD;  Location: [place] [facility];  Service: Cardiovascular;  Laterality: [name];   CESAREAN SECTION     CHOLECYSTECTOMY     CORONARY ARTERY BYPASS GRAFT [name] 07/06/2016   Procedure: CORONARY ARTERY BYPASS GRAFTING (CABG) x four, using left internal mammary artery and right leg greater saphenous vein harvested endoscopically;  Surgeon: Ivin Poot, MD;  Location: [place];  Service: Open Heart Surgery;  Laterality: [name];   ENDARTERECTOMY Right 07/06/2016   Procedure: ENDARTERECTOMY CAROTID;  Surgeon: Rosetta Posner, MD;  Location: [facility] OR;  Service: Vascular;  Laterality: Right;   ENDARTERECTOMY Right 04/27/2020   Procedure: REDO OF RIGHT ENDARTERECTOMY CAROTID;  Surgeon: Rosetta Posner, MD;  Location: [facility] OR;  Service: Vascular;  Laterality: Right;   LEFT HEART CATH AND CORS/GRAFTS ANGIOGRAPHY [name] 08/24/2020   Procedure: LEFT HEART CATH AND CORS/GRAFTS ANGIOGRAPHY;  Surgeon: Wellington Hampshire, MD;  Location: [place] [facility];  Service: Cardiovascular;  Laterality: [name];   LEFT HEART CATH AND CORS/GRAFTS ANGIOGRAPHY [name] 01/29/2022   Procedure: LEFT HEART CATH AND CORS/GRAFTS ANGIOGRAPHY;  Surgeon: Wellington Hampshire, MD;  Location: [place] [facility];  Service: Cardiovascular;  Laterality: [name];   PERIPHERAL VASCULAR CATHETERIZATION [name] 04/18/2016   Procedure: Renal Angiography;  Surgeon: Wellington Hampshire, MD;  Location: [place] [facility];  Service: Cardiovascular;  Laterality: [name];   PERIPHERAL VASCULAR INTERVENTION Left 10/15/2018   Procedure: PERIPHERAL VASCULAR INTERVENTION;  Surgeon: Wellington Hampshire, MD;  Location: [place] [facility];  Service: Cardiovascular;  Laterality: Left;  Left superficial femoral   TEE WITHOUT CARDIOVERSION [name] 07/06/2016   Procedure: TRANSESOPHAGEAL ECHOCARDIOGRAM (TEE);  Surgeon: Ivin Poot, MD;  Location: [place];  Service: Open Heart Surgery;  Laterality: [name];   TONSILLECTOMY      Current Medications: Current Meds  Medication Sig   acetaminophen (TYLENOL) 325 MG tablet Take 1-2 tablets (325-650 mg total) by mouth every 4 (four) hours as needed for mild pain.   albuterol (VENTOLIN HFA) 108 (90 Base) MCG/ACT inhaler Inhale 2 puffs into the lungs every 6 (six) hours as needed for wheezing.   ascorbic acid (VITAMIN C) 500 MG tablet Take 1 tablet (500 mg total) by mouth daily.   aspirin 81 MG chewable tablet Chew 1 tablet (81 mg total) by mouth daily with breakfast.   chlorproMAZINE (THORAZINE) 25 MG tablet Take 3 tablets (75 mg total) by mouth at bedtime.   clonazePAM (KLONOPIN) 0.25 MG disintegrating tablet Take 1 tablet (0.25  mg total) by mouth 2 (two) times daily.   clonazePAM (KLONOPIN) 0.5 MG tablet Take 0.5 mg by mouth 2 (two) times daily as needed.   cyclobenzaprine (FLEXERIL) 10 MG tablet Take 10 mg by mouth at bedtime as needed.   Dulaglutide (TRULICITY) 2.54 YH/0.6CB SOPN Inject 0.75 mg into the skin once a week.   escitalopram (LEXAPRO) 20 MG tablet Take 1 tablet (20 mg total) by mouth daily.   icosapent Ethyl (VASCEPA) 1 g capsule Take 2 g by mouth 2 (two) times daily.   insulin detemir (LEVEMIR) 100 UNIT/ML injection Inject 27 Units into the skin daily.   Insulin lispro (HUMALOG JUNIOR KWIKPEN) 100 UNIT/ML Inject 0-10 Units into the skin 3 (three) times daily. insulin Lispro (Humalog) injection 0-10 Units 0-10 Units Subcutaneous, 3 times daily with meals CBG < 70:  Implement Hypoglycemia Standing Orders and refer to Hypoglycemia Standing Orders sidebar report  CBG 70 - 120: 0 unit CBG 121 - 150: 0 unit  CBG 151 - 200: 1 unit CBG 201 - 250: 2 units CBG 251 - 300: 4 units CBG 301 - 350: 6 units  CBG 351 - 400: 8 units  CBG > 400: 10 units   isosorbide mononitrate (IMDUR) 60 MG 24 hr tablet Take 1 tablet (60 mg total) by mouth daily.   lamoTRIgine (LAMICTAL) 200 MG tablet Take 1 tablet (200 mg total) by mouth at bedtime.   nitroGLYCERIN (NITROSTAT) 0.4 MG SL tablet Place 1 tablet (0.4 mg total) under the tongue every 5 (five) minutes as needed for chest pain.   pantoprazole (PROTONIX) 40 MG tablet TAKE ONE TABLET BY MOUTH ONE TIME DAILY   pregabalin (LYRICA) 200 MG capsule Take 1 capsule (200 mg total) by mouth 2 (two) times daily.   rosuvastatin (CRESTOR) 20 MG tablet Take 1 tablet (20 mg total) by mouth daily.   senna-docusate (SENOKOT-S) 8.6-50 MG tablet Take 2 tablets by mouth at bedtime.   vitamin B-12 1000 MCG tablet Take 1 tablet (1,000 mcg total) by mouth daily.   [DISCONTINUED] carvedilol (COREG) 6.25 MG tablet Take 1 tablet (6.25 mg total) by mouth 2 (two) times daily.   [DISCONTINUED] clopidogrel (PLAVIX) 75 MG tablet TAKE ONE TABLET BY MOUTH ONE TIME DAILY   [DISCONTINUED] Evolocumab (REPATHA SURECLICK) 762 MG/ML SOAJ Inject 1 Dose into the skin every 14 (fourteen) days.   [DISCONTINUED] ezetimibe (ZETIA) 10 MG tablet TAKE ONE TABLET BY MOUTH ONE TIME DAILY     Allergies:   Chantix [varenicline tartrate]   Social History   Socioeconomic History   Marital status: Married    Spouse name: Not on file   Number of children: 1   Years of education: 14   Highest education level: Not on file  Occupational History   Occupation: Disability  Tobacco Use   Smoking status: Former    Packs/day: 0.50    Years: 27.00    Pack years: 13.50    Types: Cigarettes   Smokeless tobacco: Never  Scientific laboratory technician Use: Every day   Substances: Flavoring   Substance and Sexual Activity   Alcohol use: Not Currently    Alcohol/week: 0.0 standard drinks    Comment: socially   Drug use: No   Sexual activity: Yes    Partners: Male    Birth control/protection: None  Other Topics Concern   Not on file  Social History Narrative   11/21/20   From: [name]   Living: with husband, [name] (2009) and special needs  daughter    Work: disability       Family: daughter - [name] (special needs, lives with her) and 2 grown step children - [name] and [name]       Enjoys: play with dogs - 2 german Shepard, mut, and blue tick walker mix      Exercise: PAD limits exercise   Diet: diabetic diet and low potassium due to daughter      Safety   Seat belts: Yes    Guns: Yes  and secure   Safe in relationships: Yes    Social Determinants of Health   Financial Resource Strain: Not on file  Food Insecurity: Not on file  Transportation Needs: Not on file  Physical Activity: Not on file  Stress: Not on file  Social Connections: Not on file     Family History: The patient's family history includes Alcohol abuse in her father; Anxiety disorder in her sister; Diabetes in her father and mother; Drug abuse in her father; Heart disease in her father; Stroke in her sister. She was adopted.  ROS:   Please see the history of present illness.    All other systems reviewed and are negative.  EKGs/Labs/Other Studies Reviewed:    The following studies were reviewed today:LHC 01/29/22   Ost LAD to Prox LAD lesion is 85% stenosed.   Mid LAD lesion is 100% stenosed.   Dist LAD-1 lesion is 60% stenosed.   Dist LAD-2 lesion is 40% stenosed.   Ost Cx to Prox Cx lesion is 100% stenosed.   Prox RCA lesion is 95% stenosed.   Mid RCA lesion is 100% stenosed.   2nd Diag lesion is 95% stenosed.   Origin to Prox Graft lesion is 40% stenosed.   SVG and is normal in caliber.   SVG and is normal in caliber.   LIMA and is normal in caliber.   SVG and is normal  in caliber.   The graft exhibits no disease.   The graft exhibits no disease.   The graft exhibits no disease.   The graft exhibits no disease.   1.  Severe underlying three-vessel coronary artery disease with patent grafts including LIMA to LAD, SVG to diagonal/OM and SVG to right PDA.  Moderate mid to distal LAD disease post LIMA anastomosis. No significant change in coronary anatomy since most recent cardiac catheterization. 2.  Left ventricular angiography was not performed.  EF was normal by echo. 3.  Mildly elevated left ventricular end-diastolic pressure of 20 mmHg.   Recommendations: Elevated troponin is likely due to supply demand ischemia.  No culprit is identified.  Recommend continuing medical therapy.     Coronary Diagrams   Diagnostic Dominance: Right     TTE 01/27/2022 1. Left ventricular ejection fraction, by estimation, is 60 to 65%. The  left ventricle has normal function. The left ventricle has no regional  wall motion abnormalities. There is mild left ventricular hypertrophy.  Left ventricular diastolic parameters  are consistent with Grade II diastolic dysfunction (pseudonormalization).   2. Right ventricular systolic function is normal. The right ventricular  size is normal.   3. The mitral valve is normal in structure. Mild mitral valve  regurgitation.   4. The aortic valve is tricuspid. Aortic valve regurgitation is not  visualized.   5. The inferior vena cava is dilated in size with <50% respiratory  variability, suggesting right atrial pressure of 15 mmHg.   EKG:  EKG is  ordered today.  The ekg ordered today  demonstrates NSR, 81bpm, TWI aVL, improved from admission  Recent Labs: 06/28/2021: TSH 1.950 12/24/2021: Magnesium 2.1 01/26/2022: B Natriuretic Peptide 351.5 01/27/2022: ALT 13; BUN 11; Potassium 3.3; Sodium 138 01/29/2022: Creatinine, Ser 0.70 02/01/2022: Hemoglobin 11.0; Platelets 309  Recent Lipid Panel    Component Value Date/Time   CHOL 202 (H)  06/25/2021 0740   CHOL 121 08/21/2016 0817   TRIG 636 (H) 06/25/2021 0740   HDL 29 (L) 06/25/2021 0740   HDL 24 (L) 08/21/2016 0817   CHOLHDL 7.0 06/25/2021 0740   VLDL UNABLE TO CALCULATE IF TRIGLYCERIDE OVER 400 mg/dL 06/25/2021 0740   LDLCALC UNABLE TO CALCULATE IF TRIGLYCERIDE OVER 400 mg/dL 06/25/2021 0740   LDLCALC 64 08/21/2016 0817   LDLDIRECT 99.6 (H) 06/25/2021 0740       Physical Exam:    VS:  BP 120/80 (BP Location: Right Arm, Patient Position: Sitting, Cuff Size: Large)    Pulse 81    Ht 5' 6.5" (1.689 m)    Wt 205 lb (93 kg)    SpO2 97%    BMI 32.59 kg/m     Wt Readings from Last 3 Encounters:  02/13/22 205 lb (93 kg)  01/29/22 190 lb (86.2 kg)  12/22/21 208 lb 15.9 oz (94.8 kg)     GEN:  Well nourished, well developed in no acute distress HEENT: Normal NECK: No JVD; No carotid bruits LYMPHATICS: No lymphadenopathy CARDIAC: RRR, no murmurs, rubs, gallops RESPIRATORY:  Clear to auscultation without rales, wheezing or rhonchi  ABDOMEN: Soft, non-tender, non-distended MUSCULOSKELETAL:  No edema; No deformity  SKIN: Warm and dry NEUROLOGIC:  Alert and oriented x 3 PSYCHIATRIC:  Normal affect   ASSESSMENT:    1. Coronary artery disease involving native coronary artery without angina pectoris, unspecified whether native or transplanted heart   2. PAD (peripheral artery disease) (HCC)   3. Other hyperlipidemia   4. Stenosis of carotid artery, unspecified laterality    PLAN:    In order of problems listed above:  NSTEMI H/o CABG x4 2017 Recent NSTEMI in the setting of AMS and the absence of chest pain, treated medically. Cath showed patent grafts and otherwise stable disease. Echo showed preserved LVEF. Patient denies anginal symptoms. Cath site, right groin, is stable. No further ischemic work-up at this time. EKG with NSR, improved from admission. Continue Aspirin, Plavix, Coreg, Zetia, Vascepa, Repatha, Imdur, Crestor.   PAD/Carotid dz S/p left SFA stent  with known right sided disease. She reports claudication on the right side. Abis from 2021 showed moderate right lower diease and left showed severe lower disease. I will repeat ABIs and she will follow-up with Dr. Fletcher Anon. Continue Aspirin, statin, Plavix, Repatha, Zetia, Vascepa.   H/o CVA Recent hospitalization for AMS, ruled out stroke. Felt AMS was from polypharmacy. She is back to baseline mentally. Continues to follow with neurology. Continue Aspirin, statin, Plavix, Repatha, Zetia, Vascepa.   Disposition: Follow up in 3 month(s) with MD    Signed, Gaetana Kawahara Ninfa Meeker, PA-C  02/13/2022 4:04 PM    [facility]

## 2022-02-13 NOTE — Patient Instructions (Signed)
Medication Instructions:   Your physician recommends that you continue on your current medications as directed. Please refer to the Current Medication list given to you today.   *If you need a refill on your cardiac medications before your next appointment, please call your pharmacy*   Lab Work: None ordered  If you have labs (blood work) drawn today and your tests are completely normal, you will receive your results only by: [place] (if you have MyChart) OR A paper copy in the mail If you have any lab test that is abnormal or we need to change your treatment, we will call you to review the results.   Testing/Procedures:  Vascular Ultrasound, Ankle Brachial Index: your provider has ordered an ultrasound to evaluate the blood flow in your legs. Ultrasonography uses sound waves to create images, and can also evaluate blood flow in veins and arteries. This is a painless procedure and no preparation is necessary for  it.    Follow-Up: At [facility], you and your health needs are our priority.  As part of our continuing mission to provide you with exceptional heart care, we have created designated Provider Care Teams.  These Care Teams include your primary Cardiologist (physician) and Advanced Practice Providers (APPs -  Physician Assistants and Nurse Practitioners) who all work together to provide you with the care you need, when you need it.  We recommend signing up for the patient portal called "MyChart".  Sign up information is provided on this After Visit Summary.  MyChart is used to connect with patients for Virtual Visits (Telemedicine).  Patients are able to view lab/test results, encounter notes, upcoming appointments, etc.  Non-urgent messages can be sent to your provider as well.   To learn more about what you can do with MyChart, go to [email or web address].    Your next appointment:   1 month(s)  The format for your next appointment:   In Person  Provider:    [name], MD    Other Instructions [name]

## 2022-02-14 ENCOUNTER — Ambulatory Visit: Payer: Managed Care, Other (non HMO)

## 2022-02-14 ENCOUNTER — Encounter: Payer: Managed Care, Other (non HMO) | Admitting: Speech Pathology

## 2022-02-19 ENCOUNTER — Encounter: Payer: Managed Care, Other (non HMO) | Admitting: Speech Pathology

## 2022-02-19 ENCOUNTER — Ambulatory Visit: Payer: Managed Care, Other (non HMO)

## 2022-02-21 ENCOUNTER — Encounter: Payer: Managed Care, Other (non HMO) | Admitting: Speech Pathology

## 2022-02-21 ENCOUNTER — Ambulatory Visit: Payer: Managed Care, Other (non HMO)

## 2022-02-21 DIAGNOSIS — I2510 Atherosclerotic heart disease of native coronary artery without angina pectoris: Secondary | ICD-10-CM | POA: Diagnosis not present

## 2022-02-21 DIAGNOSIS — Z7902 Long term (current) use of antithrombotics/antiplatelets: Secondary | ICD-10-CM | POA: Diagnosis not present

## 2022-02-21 DIAGNOSIS — Z7982 Long term (current) use of aspirin: Secondary | ICD-10-CM | POA: Diagnosis not present

## 2022-02-21 DIAGNOSIS — G894 Chronic pain syndrome: Secondary | ICD-10-CM | POA: Diagnosis not present

## 2022-02-21 DIAGNOSIS — F339 Major depressive disorder, recurrent, unspecified: Secondary | ICD-10-CM | POA: Diagnosis not present

## 2022-02-21 DIAGNOSIS — F17210 Nicotine dependence, cigarettes, uncomplicated: Secondary | ICD-10-CM | POA: Diagnosis not present

## 2022-02-21 DIAGNOSIS — Z794 Long term (current) use of insulin: Secondary | ICD-10-CM | POA: Diagnosis not present

## 2022-02-21 DIAGNOSIS — E1151 Type 2 diabetes mellitus with diabetic peripheral angiopathy without gangrene: Secondary | ICD-10-CM | POA: Diagnosis not present

## 2022-02-21 DIAGNOSIS — I110 Hypertensive heart disease with heart failure: Secondary | ICD-10-CM | POA: Diagnosis not present

## 2022-02-21 DIAGNOSIS — I5022 Chronic systolic (congestive) heart failure: Secondary | ICD-10-CM | POA: Diagnosis not present

## 2022-02-21 DIAGNOSIS — E785 Hyperlipidemia, unspecified: Secondary | ICD-10-CM | POA: Diagnosis not present

## 2022-02-21 DIAGNOSIS — Z7985 Long-term (current) use of injectable non-insulin antidiabetic drugs: Secondary | ICD-10-CM | POA: Diagnosis not present

## 2022-02-21 DIAGNOSIS — I69354 Hemiplegia and hemiparesis following cerebral infarction affecting left non-dominant side: Secondary | ICD-10-CM | POA: Diagnosis not present

## 2022-02-21 DIAGNOSIS — S8252XD Displaced fracture of medial malleolus of left tibia, subsequent encounter for closed fracture with routine healing: Secondary | ICD-10-CM | POA: Diagnosis not present

## 2022-02-23 ENCOUNTER — Telehealth: Payer: Self-pay | Admitting: Family Medicine

## 2022-02-23 NOTE — Telephone Encounter (Signed)
Home Health verbal orders ?Caller Name:Cesar ?Agency Name: [facility] ? ?Callback number: [phone] ? ?Requesting OT/PT/Skilled nursing/Social Work/Speech: ? ?Reason:Home Health Aid ? ?Frequency:2 wks for 2 wks 1 wk for 6 wks starting on 02/26/22 ? ?Please forward to [facility] pool or providers CMA  ?

## 2022-02-23 NOTE — Telephone Encounter (Signed)
Ok for home health as requested

## 2022-02-23 NOTE — Telephone Encounter (Signed)
Gave verbal orders for Pt: ?2 x a week for 2 weeks and 1 x a week for 6 weeks.  ?

## 2022-02-24 DIAGNOSIS — E1169 Type 2 diabetes mellitus with other specified complication: Secondary | ICD-10-CM | POA: Diagnosis not present

## 2022-02-24 DIAGNOSIS — I6350 Cerebral infarction due to unspecified occlusion or stenosis of unspecified cerebral artery: Secondary | ICD-10-CM | POA: Diagnosis not present

## 2022-02-24 DIAGNOSIS — I5032 Chronic diastolic (congestive) heart failure: Secondary | ICD-10-CM | POA: Diagnosis not present

## 2022-02-26 ENCOUNTER — Ambulatory Visit: Payer: Managed Care, Other (non HMO)

## 2022-02-26 ENCOUNTER — Encounter: Payer: Managed Care, Other (non HMO) | Admitting: Speech Pathology

## 2022-02-27 DIAGNOSIS — Z7982 Long term (current) use of aspirin: Secondary | ICD-10-CM | POA: Diagnosis not present

## 2022-02-27 DIAGNOSIS — I110 Hypertensive heart disease with heart failure: Secondary | ICD-10-CM | POA: Diagnosis not present

## 2022-02-27 DIAGNOSIS — S8252XD Displaced fracture of medial malleolus of left tibia, subsequent encounter for closed fracture with routine healing: Secondary | ICD-10-CM | POA: Diagnosis not present

## 2022-02-27 DIAGNOSIS — G894 Chronic pain syndrome: Secondary | ICD-10-CM | POA: Diagnosis not present

## 2022-02-27 DIAGNOSIS — Z7985 Long-term (current) use of injectable non-insulin antidiabetic drugs: Secondary | ICD-10-CM | POA: Diagnosis not present

## 2022-02-27 DIAGNOSIS — I69354 Hemiplegia and hemiparesis following cerebral infarction affecting left non-dominant side: Secondary | ICD-10-CM | POA: Diagnosis not present

## 2022-02-27 DIAGNOSIS — Z7902 Long term (current) use of antithrombotics/antiplatelets: Secondary | ICD-10-CM | POA: Diagnosis not present

## 2022-02-27 DIAGNOSIS — I2510 Atherosclerotic heart disease of native coronary artery without angina pectoris: Secondary | ICD-10-CM | POA: Diagnosis not present

## 2022-02-27 DIAGNOSIS — E1151 Type 2 diabetes mellitus with diabetic peripheral angiopathy without gangrene: Secondary | ICD-10-CM | POA: Diagnosis not present

## 2022-02-27 DIAGNOSIS — I5022 Chronic systolic (congestive) heart failure: Secondary | ICD-10-CM | POA: Diagnosis not present

## 2022-02-27 DIAGNOSIS — E785 Hyperlipidemia, unspecified: Secondary | ICD-10-CM | POA: Diagnosis not present

## 2022-02-27 DIAGNOSIS — F17210 Nicotine dependence, cigarettes, uncomplicated: Secondary | ICD-10-CM | POA: Diagnosis not present

## 2022-02-27 DIAGNOSIS — F339 Major depressive disorder, recurrent, unspecified: Secondary | ICD-10-CM | POA: Diagnosis not present

## 2022-02-27 DIAGNOSIS — Z794 Long term (current) use of insulin: Secondary | ICD-10-CM | POA: Diagnosis not present

## 2022-02-28 ENCOUNTER — Telehealth: Payer: Self-pay

## 2022-02-28 ENCOUNTER — Encounter: Payer: Managed Care, Other (non HMO) | Admitting: Speech Pathology

## 2022-02-28 ENCOUNTER — Ambulatory Visit: Payer: Managed Care, Other (non HMO)

## 2022-02-28 NOTE — Telephone Encounter (Signed)
PA for [employer] sent to insurance through CoverMyMeds ?

## 2022-03-01 ENCOUNTER — Ambulatory Visit (INDEPENDENT_AMBULATORY_CARE_PROVIDER_SITE_OTHER): Payer: BC Managed Care – PPO | Admitting: Family Medicine

## 2022-03-01 ENCOUNTER — Other Ambulatory Visit: Payer: Self-pay

## 2022-03-01 ENCOUNTER — Encounter: Payer: Self-pay | Admitting: Family Medicine

## 2022-03-01 VITALS — BP 124/84 | HR 76 | Temp 98.0°F | Resp 16 | Ht 66.5 in | Wt 214.4 lb

## 2022-03-01 DIAGNOSIS — Z7985 Long-term (current) use of injectable non-insulin antidiabetic drugs: Secondary | ICD-10-CM | POA: Diagnosis not present

## 2022-03-01 DIAGNOSIS — Z79899 Other long term (current) drug therapy: Secondary | ICD-10-CM

## 2022-03-01 DIAGNOSIS — I5022 Chronic systolic (congestive) heart failure: Secondary | ICD-10-CM | POA: Diagnosis not present

## 2022-03-01 DIAGNOSIS — I110 Hypertensive heart disease with heart failure: Secondary | ICD-10-CM | POA: Diagnosis not present

## 2022-03-01 DIAGNOSIS — Z23 Encounter for immunization: Secondary | ICD-10-CM | POA: Diagnosis not present

## 2022-03-01 DIAGNOSIS — F339 Major depressive disorder, recurrent, unspecified: Secondary | ICD-10-CM | POA: Diagnosis not present

## 2022-03-01 DIAGNOSIS — G894 Chronic pain syndrome: Secondary | ICD-10-CM | POA: Diagnosis not present

## 2022-03-01 DIAGNOSIS — I2510 Atherosclerotic heart disease of native coronary artery without angina pectoris: Secondary | ICD-10-CM | POA: Diagnosis not present

## 2022-03-01 DIAGNOSIS — Z1211 Encounter for screening for malignant neoplasm of colon: Secondary | ICD-10-CM

## 2022-03-01 DIAGNOSIS — F17210 Nicotine dependence, cigarettes, uncomplicated: Secondary | ICD-10-CM | POA: Diagnosis not present

## 2022-03-01 DIAGNOSIS — Z794 Long term (current) use of insulin: Secondary | ICD-10-CM | POA: Diagnosis not present

## 2022-03-01 DIAGNOSIS — Z7902 Long term (current) use of antithrombotics/antiplatelets: Secondary | ICD-10-CM | POA: Diagnosis not present

## 2022-03-01 DIAGNOSIS — I69354 Hemiplegia and hemiparesis following cerebral infarction affecting left non-dominant side: Secondary | ICD-10-CM | POA: Diagnosis not present

## 2022-03-01 DIAGNOSIS — E785 Hyperlipidemia, unspecified: Secondary | ICD-10-CM | POA: Diagnosis not present

## 2022-03-01 DIAGNOSIS — S8252XD Displaced fracture of medial malleolus of left tibia, subsequent encounter for closed fracture with routine healing: Secondary | ICD-10-CM | POA: Diagnosis not present

## 2022-03-01 DIAGNOSIS — E1151 Type 2 diabetes mellitus with diabetic peripheral angiopathy without gangrene: Secondary | ICD-10-CM | POA: Diagnosis not present

## 2022-03-01 DIAGNOSIS — Z7982 Long term (current) use of aspirin: Secondary | ICD-10-CM | POA: Diagnosis not present

## 2022-03-01 DIAGNOSIS — F01A0 Vascular dementia, mild, without behavioral disturbance, psychotic disturbance, mood disturbance, and anxiety: Secondary | ICD-10-CM

## 2022-03-01 NOTE — Progress Notes (Signed)
? ?Subjective:  ? ?  ?Erika [name] is a 51 y.o. female presenting for Hospitalization Follow-up (Feb 2nd-9th, then was transferred [facility] from feb 9th-28th. Feeling well would like to discuss about being discharged from nursing facility. Denies any pain. ) ?  ? ? ?HPI ? ?#AMS ?- not sleeping well ?- since the hospitalization ?- not having significant panic episodes  ?- taking clonazepam ?- going to sleep but waking up around 2/3 AM  ?- not lasting the whole 8 hours ?- Thorazine had been helping and was reducing  ? ?#dementia ?- noticing some easy forgetfulness ?- not remembering conversations that she had the day before ?- last neuro appointment - had been with her vascular ?- working with rehab ?- should expect [place] order ?- walking now - will use a walker in the house ?- only the wheelchair for out of the house ? ?#Blood pressure ?- looks good ?- seeing cardiology ?- getting ABIs done ? ?Did have covid - no symptoms ? ?Review of Systems ? ?2/3-02/01/2022: Admission - AMS - demand ischemia r/o NSTEMI. Stage 2 pressure ulcer. Suspect AMS 2/2 to medication. Discharged to SNF - clonazepam and thorazine decreased ?02/13/2022: Psych - Dr. Adele Schilder - continue discharge medication, f/u 2 months ?vascular dementia - advised neuro - has appt 3/13 ? ?Social History  ? ?Tobacco Use  ?Smoking Status Former  ? Packs/day: 0.50  ? Years: 27.00  ? Pack years: 13.50  ? Types: Cigarettes  ?Smokeless Tobacco Never  ? ? ? ?   ?Objective:  ?  ?BP Readings from Last 3 Encounters:  ?03/01/22 124/84  ?02/13/22 120/80  ?02/01/22 (!) 151/68  ? ?Wt Readings from Last 3 Encounters:  ?03/01/22 214 lb 6.4 oz (97.3 kg)  ?02/13/22 205 lb (93 kg)  ?02/13/22 200 lb (90.7 kg)  ? ? ?BP 124/84 (BP Location: Right Arm, Patient Position: Sitting, Cuff Size: Large)   Pulse 76   Temp 98 ?F (36.7 ?C) (Oral)   Resp 16   Ht 5' 6.5" (1.689 m)   Wt 214 lb 6.4 oz (97.3 kg)   SpO2 97%   BMI 34.09 kg/m?  ? ? ?Physical Exam ?Constitutional:    ?   General: She is not in acute distress. ?   Appearance: She is well-developed. She is not diaphoretic.  ?HENT:  ?   Right Ear: External ear normal.  ?   Left Ear: External ear normal.  ?Eyes:  ?   Conjunctiva/sclera: Conjunctivae normal.  ?Cardiovascular:  ?   Rate and Rhythm: Normal rate and regular rhythm.  ?Pulmonary:  ?   Effort: Pulmonary effort is normal. No respiratory distress.  ?   Breath sounds: Normal breath sounds. No wheezing.  ?Musculoskeletal:  ?   Cervical back: Neck supple.  ?   Comments: Sitting in wheelchair, left arm paralysis.   ?Skin: ?   General: Skin is warm and dry.  ?   Capillary Refill: Capillary refill takes less than 2 seconds.  ?Neurological:  ?   Mental Status: She is alert. Mental status is at baseline.  ?Psychiatric:     ?   Mood and Affect: Mood normal.     ?   Behavior: Behavior normal.  ? ? ? ? ? ?   ?Assessment & Plan:  ? ?Problem List Items Addressed This Visit   ? ?  ? Nervous and Auditory  ? Hemiplegia and hemiparesis following cerebral infarction affecting left non-dominant side (HCC) - Primary  ?  She has improved with some ambulation, however can only go short distances.  Family feel they would benefit from bedside commode to help with nighttime urination.  DME ordered.  Following with neurology has follow-up next week appreciate neurology support. ?  ?  ? Relevant Orders  ? DME Bedside commode  ? Mild vascular dementia without behavioral disturbance, psychotic disturbance, mood disturbance, or anxiety  ?  Appreciate neurology support she has an appointment next week, patient and husband note worsening memory since her stroke is over the summer. ?  ?  ?  ? Other  ? Long term prescription benzodiazepine use (Chronic)  ? Polypharmacy  ?  Recent hospitalization thought due to polypharmacy her clonazepam had been decreased to 0.25 mg twice daily she seems to be doing okay on this.  Thorazine has been decreased to 75 mg, however she notes that she is still suffering from  some insomnia.  Discussed with psych they feel like she should not have her medication increased at this time.  We will see if neurology can offer better support for sleep in setting of likely new onset vascular dementia. ?  ?  ? ?Other Visit Diagnoses   ? ? Screening for colon cancer      ? Relevant Orders  ? Cologuard  ? Need for influenza vaccination      ? Need for Tdap vaccination      ? ?  ? ?I spent >25 minutes with pt , obtaining history, examining, reviewing chart, documenting encounter and discussing the above plan of care. ? ? ? ?Return in about 6 months (around 09/01/2022). ? ?Lesleigh Noe, MD ? ?This visit occurred during the SARS-CoV-2 public health emergency.  Safety protocols were in place, including screening questions prior to the visit, additional usage of staff PPE, and extensive cleaning of exam room while observing appropriate contact time as indicated for disinfecting solutions.  ? ?

## 2022-03-01 NOTE — Assessment & Plan Note (Signed)
She has improved with some ambulation, however can only go short distances.  Family feel they would benefit from bedside commode to help with nighttime urination.  DME ordered.  Following with neurology has follow-up next week appreciate neurology support. ?

## 2022-03-01 NOTE — Telephone Encounter (Signed)
Additional notes faxed to [employer] ?

## 2022-03-01 NOTE — Assessment & Plan Note (Signed)
Recent hospitalization thought due to polypharmacy her clonazepam had been decreased to 0.25 mg twice daily she seems to be doing okay on this.  Thorazine has been decreased to 75 mg, however she notes that she is still suffering from some insomnia.  Discussed with psych they feel like she should not have her medication increased at this time.  We will see if neurology can offer better support for sleep in setting of likely new onset vascular dementia. ?

## 2022-03-01 NOTE — Assessment & Plan Note (Signed)
Appreciate neurology support she has an appointment next week, patient and husband note worsening memory since her stroke is over the summer. ?

## 2022-03-02 ENCOUNTER — Encounter: Payer: Self-pay | Admitting: Endocrinology

## 2022-03-02 ENCOUNTER — Other Ambulatory Visit: Payer: Self-pay | Admitting: Cardiovascular Disease

## 2022-03-02 DIAGNOSIS — I739 Peripheral vascular disease, unspecified: Secondary | ICD-10-CM

## 2022-03-05 ENCOUNTER — Ambulatory Visit: Payer: Managed Care, Other (non HMO)

## 2022-03-05 ENCOUNTER — Ambulatory Visit (INDEPENDENT_AMBULATORY_CARE_PROVIDER_SITE_OTHER): Payer: BC Managed Care – PPO | Admitting: Neurology

## 2022-03-05 ENCOUNTER — Encounter: Payer: Managed Care, Other (non HMO) | Admitting: Speech Pathology

## 2022-03-05 ENCOUNTER — Encounter: Payer: Self-pay | Admitting: Neurology

## 2022-03-05 VITALS — BP 100/61 | HR 70 | Ht 66.5 in | Wt 202.0 lb

## 2022-03-05 DIAGNOSIS — R4189 Other symptoms and signs involving cognitive functions and awareness: Secondary | ICD-10-CM | POA: Diagnosis not present

## 2022-03-05 DIAGNOSIS — E785 Hyperlipidemia, unspecified: Secondary | ICD-10-CM | POA: Diagnosis not present

## 2022-03-05 DIAGNOSIS — I2510 Atherosclerotic heart disease of native coronary artery without angina pectoris: Secondary | ICD-10-CM | POA: Diagnosis not present

## 2022-03-05 DIAGNOSIS — E559 Vitamin D deficiency, unspecified: Secondary | ICD-10-CM

## 2022-03-05 DIAGNOSIS — I69354 Hemiplegia and hemiparesis following cerebral infarction affecting left non-dominant side: Secondary | ICD-10-CM | POA: Diagnosis not present

## 2022-03-05 DIAGNOSIS — Z7902 Long term (current) use of antithrombotics/antiplatelets: Secondary | ICD-10-CM | POA: Diagnosis not present

## 2022-03-05 DIAGNOSIS — I110 Hypertensive heart disease with heart failure: Secondary | ICD-10-CM | POA: Diagnosis not present

## 2022-03-05 DIAGNOSIS — Z7982 Long term (current) use of aspirin: Secondary | ICD-10-CM | POA: Diagnosis not present

## 2022-03-05 DIAGNOSIS — Z794 Long term (current) use of insulin: Secondary | ICD-10-CM | POA: Diagnosis not present

## 2022-03-05 DIAGNOSIS — S8252XD Displaced fracture of medial malleolus of left tibia, subsequent encounter for closed fracture with routine healing: Secondary | ICD-10-CM | POA: Diagnosis not present

## 2022-03-05 DIAGNOSIS — F418 Other specified anxiety disorders: Secondary | ICD-10-CM

## 2022-03-05 DIAGNOSIS — I5022 Chronic systolic (congestive) heart failure: Secondary | ICD-10-CM | POA: Diagnosis not present

## 2022-03-05 DIAGNOSIS — I6350 Cerebral infarction due to unspecified occlusion or stenosis of unspecified cerebral artery: Secondary | ICD-10-CM

## 2022-03-05 DIAGNOSIS — Z7985 Long-term (current) use of injectable non-insulin antidiabetic drugs: Secondary | ICD-10-CM | POA: Diagnosis not present

## 2022-03-05 DIAGNOSIS — F339 Major depressive disorder, recurrent, unspecified: Secondary | ICD-10-CM | POA: Diagnosis not present

## 2022-03-05 DIAGNOSIS — F17210 Nicotine dependence, cigarettes, uncomplicated: Secondary | ICD-10-CM | POA: Diagnosis not present

## 2022-03-05 DIAGNOSIS — E1151 Type 2 diabetes mellitus with diabetic peripheral angiopathy without gangrene: Secondary | ICD-10-CM | POA: Diagnosis not present

## 2022-03-05 DIAGNOSIS — Z9889 Other specified postprocedural states: Secondary | ICD-10-CM

## 2022-03-05 DIAGNOSIS — G894 Chronic pain syndrome: Secondary | ICD-10-CM | POA: Diagnosis not present

## 2022-03-05 DIAGNOSIS — R5383 Other fatigue: Secondary | ICD-10-CM

## 2022-03-05 MED ORDER — DONEPEZIL HCL 5 MG PO TABS: 5.0000 mg | ORAL_TABLET | Freq: Every day | ORAL | 11 refills | Status: DC

## 2022-03-05 MED ORDER — CLOPIDOGREL BISULFATE 75 MG PO TABS: 75.0000 mg | ORAL_TABLET | Freq: Every day | ORAL | 3 refills | Status: DC

## 2022-03-05 NOTE — Progress Notes (Signed)
[facility]  PATIENT: Erika [name] DOB: [birth year]  REFERRING DOCTOR OR PCP: Reesa Chew, PA-C SOURCE: Patient, notes from hospital, imaging and laboratory reports, MRI images personally reviewed  _________________________________   HISTORICAL  CHIEF COMPLAINT:  Chief Complaint  Patient presents with   Follow-up    Rm 1, w mother in law and daughter. Pt reports doing well. Has times where she is forgetful. Dementia has gotten a little worse. Has not had a HA in a while.     HISTORY OF PRESENT ILLNESS:  Erika [name] a 51 yo woman with h/o strokes.    Update 03/05/2022: She notes that she is doing about the same or a little bit better physically but is having more problems with short term memory.    She reports cognitive issues worsened after her stroke though she did have a diagnosis of pseudodementia due to depression earlier.    She has had some improvement in left leg strengh but left arm is completely wea.   She uses a hemi-walker and can go about 50 feet.   She is able to stand up in the shower now.  The left leg buckles, despite an orthotic  She is getting a new custom brace later this week.     She reports insomnia.  Thorazine at night has helped the most  History of stroke: She had a right pontine stroke 06/24/2021.   At the time the whole left side was paralyzed.   Speech was slurred.   911 was called and she was taken to the [facility] ED.    She spent 3 days there in the ED due to no bed and then was taken to the [facility]. Imaging studies 06/2021 also showed two strokes incolving gray and white matter in the right parieto-occiptal lobe and the right parietal lobe.     She has pain in the right leg due to PAD (stents have re-occluded. .   Also has pain in the joints.  She sees Pain Management and is on oxycodone.  Carotid endarterectomies 05/2020 and 06/2016 were done after carotid stenosis noted screenng after a 2017 MI (has had 2).    She had CABG in 2017.   The 2021 CEA done after repeat neck CTA showed critical stenosis distal to initial CEA.     Current LVEF% (July 2022) is 60-65%.      She is currently on Plavix.  She is on Repatha for elevated cholesterol.    She has Type 2 IDDM as well.    She has depression and is on fluoxetine.   She takes clonazepam 025 mg po bid for anxiety with benefit.    Thorazine helps her insomnia.         Imaging: I reviewed the MRI of the brain 06/24/2021 and 06/27/2021.  They show an acute infarction on the right side of the pons.  Additionally, there is encephalomalacia/gliosis in the right parieto-occipital region and right parietal lobe consistent with remote strokes.  There is mild chronic microvascular ischemic change.  CT angiogram 06/24/2021 showed stenosis in the distal right common carotid and proximal right internal carotid artery there is high-grade stenosis of the left vertebral origin with reconstitution distally in the neck atherosclerosis is noted in the proximal left intracranial vertebral artery.  CT scan 09/08/2021 showed T2 remote strokes in the right parietal lobe that have been seen previously as well as the stroke in the pons with expected evolution  CTscan 02/25/2019 of the  brain did not show any strokes.       REVIEW OF SYSTEMS: Constitutional: No fevers, chills, sweats, or change in appetite Eyes: No visual changes, double vision, eye pain Ear, nose and throat: No hearing loss, ear pain, nasal congestion, sore throat Cardiovascular: No chest pain, palpitations.  She has peripheral vascular disease and carotid stenosis Respiratory:  No shortness of breath at rest or with exertion.   No wheezes GastrointestinaI: No nausea, vomiting, diarrhea, abdominal pain, fecal incontinence Genitourinary:  No dysuria, urinary retention or frequency.  No nocturia. Musculoskeletal:  No neck pain, back pain Integumentary: No rash, pruritus, skin lesions Neurological: as  above Psychiatric: Depression/anxiety as above Endocrine: She has insulin-dependent diabetes mellitus  hematologic/Lymphatic:  No anemia, purpura, petechiae. Allergic/Immunologic: No itchy/runny eyes, nasal congestion, recent allergic reactions, rashes  ALLERGIES: Allergies  Allergen Reactions   Chantix [Varenicline Tartrate] Other (See Comments)    Feels "crazy" and angry     HOME MEDICATIONS:  Current Outpatient Medications:    acetaminophen (TYLENOL) 325 MG tablet, Take 1-2 tablets (325-650 mg total) by mouth every 4 (four) hours as needed for mild pain., Disp: , Rfl:    albuterol (VENTOLIN HFA) 108 (90 Base) MCG/ACT inhaler, Inhale 2 puffs into the lungs every 6 (six) hours as needed for wheezing., Disp: 18 g, Rfl: 0   ascorbic acid (VITAMIN C) 500 MG tablet, Take 1 tablet (500 mg total) by mouth daily., Disp: 30 tablet, Rfl: 2   aspirin 81 MG chewable tablet, Chew 1 tablet (81 mg total) by mouth daily with breakfast., Disp: 120 tablet, Rfl: 1   carvedilol (COREG) 6.25 MG tablet, Take 1 tablet (6.25 mg total) by mouth 2 (two) times daily., Disp: 180 tablet, Rfl: 0   chlorproMAZINE (THORAZINE) 25 MG tablet, Take 3 tablets (75 mg total) by mouth at bedtime., Disp: 90 tablet, Rfl: 1   clonazePAM (KLONOPIN) 0.25 MG disintegrating tablet, Take 1 tablet (0.25 mg total) by mouth 2 (two) times daily., Disp: 60 tablet, Rfl: 1   clopidogrel (PLAVIX) 75 MG tablet, Take 1 tablet (75 mg total) by mouth daily., Disp: 90 tablet, Rfl: 0   cyclobenzaprine (FLEXERIL) 10 MG tablet, Take 10 mg by mouth at bedtime as needed., Disp: , Rfl:    donepezil (ARICEPT) 5 MG tablet, Take 1 tablet (5 mg total) by mouth at bedtime., Disp: 30 tablet, Rfl: 11   Dulaglutide (TRULICITY) 9.38 BO/1.7PZ SOPN, Inject 0.75 mg into the skin once a week., Disp: 6 mL, Rfl: 3   escitalopram (LEXAPRO) 20 MG tablet, Take 1 tablet (20 mg total) by mouth daily., Disp: 30 tablet, Rfl: 1   Evolocumab (REPATHA SURECLICK) 025 MG/ML  SOAJ, Inject 1 Dose into the skin every 14 (fourteen) days., Disp: 6 mL, Rfl: 0   ezetimibe (ZETIA) 10 MG tablet, Take 1 tablet (10 mg total) by mouth daily., Disp: 90 tablet, Rfl: 0   icosapent Ethyl (VASCEPA) 1 g capsule, Take 2 g by mouth 2 (two) times daily., Disp: , Rfl:    insulin detemir (LEVEMIR) 100 UNIT/ML injection, Inject 27 Units into the skin daily., Disp: , Rfl:    Insulin lispro (HUMALOG JUNIOR KWIKPEN) 100 UNIT/ML, Inject 0-10 Units into the skin 3 (three) times daily. insulin Lispro (Humalog) injection 0-10 Units 0-10 Units Subcutaneous, 3 times daily with meals CBG < 70: Implement Hypoglycemia Standing Orders and refer to Hypoglycemia Standing Orders sidebar report  CBG 70 - 120: 0 unit CBG 121 - 150: 0 unit  CBG 151 - 200:  1 unit CBG 201 - 250: 2 units CBG 251 - 300: 4 units CBG 301 - 350: 6 units  CBG 351 - 400: 8 units  CBG > 400: 10 units, Disp: 3 mL, Rfl: 3   isosorbide mononitrate (IMDUR) 60 MG 24 hr tablet, Take 1 tablet (60 mg total) by mouth daily., Disp: 60 tablet, Rfl: 2   lamoTRIgine (LAMICTAL) 200 MG tablet, Take 1 tablet (200 mg total) by mouth at bedtime., Disp: 30 tablet, Rfl: 1   nitroGLYCERIN (NITROSTAT) 0.4 MG SL tablet, Place 1 tablet (0.4 mg total) under the tongue every 5 (five) minutes as needed for chest pain., Disp: 25 tablet, Rfl: 1   pantoprazole (PROTONIX) 40 MG tablet, TAKE ONE TABLET BY MOUTH ONE TIME DAILY, Disp: 30 tablet, Rfl: 0   pregabalin (LYRICA) 200 MG capsule, Take 1 capsule (200 mg total) by mouth 2 (two) times daily., Disp: , Rfl:    rosuvastatin (CRESTOR) 20 MG tablet, Take 1 tablet (20 mg total) by mouth daily., Disp: 30 tablet, Rfl: 0   senna-docusate (SENOKOT-S) 8.6-50 MG tablet, Take 2 tablets by mouth at bedtime., Disp: 120 tablet, Rfl: 0   vitamin B-12 1000 MCG tablet, Take 1 tablet (1,000 mcg total) by mouth daily., Disp: , Rfl:   PAST MEDICAL HISTORY: Past Medical History:  Diagnosis Date   Arthralgia of temporomandibular joint     CAD, multiple vessel    a. 06/2016 Cath: ostLM 40%, ostLAD 40%, pLAD 95%, ost-pLCx 60%, pLCx 95%, mLCx 60%, mRCA 95%, D2 50%, LVSF nl;  b. 07/2016 CABG x 4 (LIMA->LAD, VG->Diag, VG->OM, VG->RCA); c. 08/2016 Cath: 3VD w/ 4/4 patent grafts. LAD distal to LIMA has diff dzs->Med rx; d. 08/2020 Cath: 4/4 patent grafts, native 3VD. EF 55-65%-->Med Rx.   Carotid arterial disease ([place])    a. 07/2016 s/p R CEA; b. 02/2021 U/S: RICA 40-34%, LICA 7-42%.   Clotting disorder ([place])    Depression    Diastolic dysfunction    a. 06/2016 Echo: EF 50-55%, mild inf wall HK, GR1DD, mild MR, RV sys fxn nl, mildly dilated LA, PASP nl; b. 06/2021 Echo: EF 60-65%, no rwma. Nl RV fxn.   Fatty liver disease, nonalcoholic 5956   History of blood transfusion    with heart surgery   HLD (hyperlipidemia)    Labile hypertension    a. prior renal ngiogram negative for RAS in 03/2016; b. catecholamines and metanephrines normal, mildly elevated renin with normal aldosterone and normal ratio in 02/2016   Myocardial infarction [facility]) 2017   Obesity    PAD (peripheral artery disease) ([place])    a. 09/2018 s/p L SFA stenting; b. 07/2019 Periph Angio: Patent m/d L SFA stent w/ 100% L SFA distal to stent. L AT 100d, L Peroneal diff dzs-->Med Rx; c. 02/2021 ABIs: stable @ 0.61 on R and 0.46 on L.   PTSD (post-traumatic stress disorder)    Tobacco abuse    Type 2 diabetes mellitus ([place]) 12/2015    PAST SURGICAL HISTORY: Past Surgical History:  Procedure Laterality Date   ABDOMINAL AORTOGRAM W/LOWER EXTREMITY [name] 10/15/2018   Procedure: ABDOMINAL AORTOGRAM W/LOWER EXTREMITY;  Surgeon: Wellington Hampshire, MD;  Location: [place] [facility];  Service: Cardiovascular;  Laterality: [name];   ABDOMINAL AORTOGRAM W/LOWER EXTREMITY Bilateral 08/19/2019   Procedure: ABDOMINAL AORTOGRAM W/LOWER EXTREMITY;  Surgeon: Wellington Hampshire, MD;  Location: [place] [facility];  Service: Cardiovascular;  Laterality: Bilateral;   CARDIAC CATHETERIZATION [name] 06/29/2016    Procedure: Left Heart  Cath and Coronary Angiography;  Surgeon: Minna Merritts, MD;  Location: [place] [facility];  Service: Cardiovascular;  Laterality: [name];   CARDIAC CATHETERIZATION [name] 08/29/2016   Procedure: Left Heart Cath and Cors/Grafts Angiography;  Surgeon: Wellington Hampshire, MD;  Location: [place] [facility];  Service: Cardiovascular;  Laterality: [name];   CESAREAN SECTION     CHOLECYSTECTOMY     CORONARY ARTERY BYPASS GRAFT [name] 07/06/2016   Procedure: CORONARY ARTERY BYPASS GRAFTING (CABG) x four, using left internal mammary artery and right leg greater saphenous vein harvested endoscopically;  Surgeon: Ivin Poot, MD;  Location: [place];  Service: Open Heart Surgery;  Laterality: [name];   ENDARTERECTOMY Right 07/06/2016   Procedure: ENDARTERECTOMY CAROTID;  Surgeon: Rosetta Posner, MD;  Location: [facility] OR;  Service: Vascular;  Laterality: Right;   ENDARTERECTOMY Right 04/27/2020   Procedure: REDO OF RIGHT ENDARTERECTOMY CAROTID;  Surgeon: Rosetta Posner, MD;  Location: [facility] OR;  Service: Vascular;  Laterality: Right;   LEFT HEART CATH AND CORS/GRAFTS ANGIOGRAPHY [name] 08/24/2020   Procedure: LEFT HEART CATH AND CORS/GRAFTS ANGIOGRAPHY;  Surgeon: Wellington Hampshire, MD;  Location: [place] [facility];  Service: Cardiovascular;  Laterality: [name];   LEFT HEART CATH AND CORS/GRAFTS ANGIOGRAPHY [name] 01/29/2022   Procedure: LEFT HEART CATH AND CORS/GRAFTS ANGIOGRAPHY;  Surgeon: Wellington Hampshire, MD;  Location: [place] [facility];  Service: Cardiovascular;  Laterality: [name];   PERIPHERAL VASCULAR CATHETERIZATION [name] 04/18/2016   Procedure: Renal Angiography;  Surgeon: Wellington Hampshire, MD;  Location: [place] [facility];  Service: Cardiovascular;  Laterality: [name];   PERIPHERAL VASCULAR INTERVENTION Left 10/15/2018   Procedure: PERIPHERAL VASCULAR INTERVENTION;  Surgeon: Wellington Hampshire, MD;  Location: [place] [facility];  Service: Cardiovascular;  Laterality: Left;  Left superficial femoral   TEE WITHOUT  CARDIOVERSION [name] 07/06/2016   Procedure: TRANSESOPHAGEAL ECHOCARDIOGRAM (TEE);  Surgeon: Ivin Poot, MD;  Location: [place];  Service: Open Heart Surgery;  Laterality: [name];   TONSILLECTOMY      FAMILY HISTORY: Family History  Adopted: Yes  Problem Relation Age of Onset   Diabetes Mother    Diabetes Father    Alcohol abuse Father    Heart disease Father    Drug abuse Father    Stroke Sister    Anxiety disorder Sister     SOCIAL HISTORY:  Social History   Socioeconomic History   Marital status: Married    Spouse name: Not on file   Number of children: 1   Years of education: 14   Highest education level: Not on file  Occupational History   Occupation: Disability  Tobacco Use   Smoking status: Former    Packs/day: 0.50    Years: 27.00    Pack years: 13.50    Types: Cigarettes   Smokeless tobacco: Never  Scientific laboratory technician Use: Every day   Substances: Flavoring  Substance and Sexual Activity   Alcohol use: Not Currently    Alcohol/week: 0.0 standard drinks    Comment: socially   Drug use: No   Sexual activity: Yes    Partners: Male    Birth control/protection: None  Other Topics Concern   Not on file  Social History Narrative   11/21/20   From: [name]   Living: with husband, [name] (2009) and special needs daughter    Work: disability       Family: daughter - [name] (special needs, lives with her) and 2 grown step  children - [name] and [name]       Enjoys: play with dogs - 2 german Shepard, mut, and blue tick walker mix      Exercise: PAD limits exercise   Diet: diabetic diet and low potassium due to daughter      Safety   Seat belts: Yes    Guns: Yes  and secure   Safe in relationships: Yes    Social Determinants of Health   Financial Resource Strain: Not on file  Food Insecurity: Not on file  Transportation Needs: Not on file  Physical Activity: Not on file  Stress: Not on file  Social Connections: Not on file  Intimate  Partner Violence: Not on file     PHYSICAL EXAM  Vitals:   03/05/22 1119  BP: 100/61  Pulse: 70  Weight: 202 lb (91.6 kg)  Height: 5' 6.5" (1.689 m)    Body mass index is 32.12 kg/m.   General: The patient is well-developed and well-nourished and in no acute distress  HEENT:  Head is [place]/AT.  Sclera are anicteric.   Skin: Extremities are without rash or edema.  Musculoskeletal:  Back is nontender  Neurologic Exam  Mental status: The patient is alert and oriented x  2 1/2 (March 2023 but not day) at the time of the examination.  Short-term memory was 2/3 without hints at 5 minutes and 3/3 with a hint.  Speech is normal.  Cranial nerves: Extraocular movements are full.  Facial strength and sensation was normal.  No obvious hearing deficits are noted.  Motor:  Muscle bulk is normal.   Tone is reduced in the left arm. Strength is  5 / 5 on the right but 0-1/5 in the distal left arm, 2 -/5 in the proximal left arm and 2/5 in the left leg except 3/5 quads..   Sensory: Sensory testing is intact to pinprick, soft touch and vibration sensation in all 4 extremities.  Coordination: Cerebellar testing reveals good finger-nose-finger and heel-to-shin bilaterally.  Gait and station: She needs support to stand up from the chair but is able to bear her weight once up.  With support she can take some steps.  She has left foot drop..   Reflexes: Deep tendon reflexes are symmetric and normal bilaterally.   r.    DIAGNOSTIC DATA (LABS, IMAGING, TESTING) - I reviewed patient records, labs, notes, testing and imaging myself where available.  Lab Results  Component Value Date   WBC 8.7 02/01/2022   HGB 11.0 (L) 02/01/2022   HCT 35.0 (L) 02/01/2022   MCV 88.6 02/01/2022   PLT 309 02/01/2022      Component Value Date/Time   NA 138 01/27/2022 0441   NA 138 09/11/2021 1413   NA 135 (L) 01/11/2015 1022   K 3.3 (L) 01/27/2022 0441   K 3.9 01/11/2015 1022   CL 105 01/27/2022 0441   CL  100 01/11/2015 1022   CO2 28 01/27/2022 0441   CO2 27 01/11/2015 1022   GLUCOSE 78 01/27/2022 0441   GLUCOSE 183 (H) 01/11/2015 1022   BUN 11 01/27/2022 0441   BUN 10 09/11/2021 1413   BUN 6 (L) 01/11/2015 1022   CREATININE 0.70 01/29/2022 0436   CREATININE 0.77 01/11/2015 1022   CALCIUM 8.5 (L) 01/27/2022 0441   CALCIUM 10.0 01/11/2015 1022   PROT 5.6 (L) 01/27/2022 0441   PROT 7.4 09/11/2021 1413   PROT 7.8 01/11/2015 1022   ALBUMIN 2.7 (L) 01/27/2022 0441   ALBUMIN 4.5  09/11/2021 1413   ALBUMIN 3.4 01/11/2015 1022   AST 21 01/27/2022 0441   AST 70 (H) 01/11/2015 1022   ALT 13 01/27/2022 0441   ALT 85 (H) 01/11/2015 1022   ALKPHOS 46 01/27/2022 0441   ALKPHOS 91 01/11/2015 1022   BILITOT 0.3 01/27/2022 0441   BILITOT 0.2 09/11/2021 1413   BILITOT 0.4 01/11/2015 1022   GFRNONAA >60 01/29/2022 0436   GFRNONAA >60 01/11/2015 1022   GFRNONAA 41 (L) 08/10/2014 1459   GFRAA 106 01/12/2021 0934   GFRAA >60 01/11/2015 1022   GFRAA 48 (L) 08/10/2014 1459   Lab Results  Component Value Date   CHOL 202 (H) 06/25/2021   HDL 29 (L) 06/25/2021   LDLCALC UNABLE TO CALCULATE IF TRIGLYCERIDE OVER 400 mg/dL 06/25/2021   LDLDIRECT 99.6 (H) 06/25/2021   TRIG 636 (H) 06/25/2021   CHOLHDL 7.0 06/25/2021   Lab Results  Component Value Date   HGBA1C 7.0 (H) 11/21/2021   Lab Results  Component Value Date   VITAMINB12 169 (L) 06/28/2021   Lab Results  Component Value Date   TSH 1.950 06/28/2021       ASSESSMENT AND PLAN  Right pontine stroke (HCC)  Hemiplegia and hemiparesis following cerebral infarction affecting left non-dominant side (HCC)  Vitamin D deficiency  Cognitive change  Other fatigue   History of carotid endarterectomy (Right)  Depression with anxiety   Continue Plavix and aspirin. Cognitive issues are likely a combination of the parietal stroke and depression/reduced focus.  Of note, short-term memory was 2 out of 3 without hints and 3/3 with hints  at 5 minutes  continue therapy as indicated  return in 12 months or sooner if there are new or worsening neurologic symptoms.  Joseph Bias A. Felecia Shelling, MD, [facility] 4/97/0263, 78:58 AM Certified in Neurology, Clinical Neurophysiology, Sleep Medicine and Neuroimaging  [facility] [address] [place], [place] [address] [phone]

## 2022-03-05 NOTE — Patient Instructions (Signed)
Vitamin D 5000 Units a day (available without a prescription) ? ?Try a new medication (donepezil) for memory.  If it is well tolerated, call us and we will double the dose.   ?

## 2022-03-05 NOTE — Telephone Encounter (Signed)
[employer] sent a fax denying Dexcom G6 Transmitter. They stated the patient has to be on a rapid acting insulin, such as Admelog, Afreeza, Apidra, Fiasp, Humalog or Novolog or regular insulin like Humulin or Novolin. Chart notes were sent with medication list and they stated an appeal can be made ?

## 2022-03-06 ENCOUNTER — Telehealth: Payer: Self-pay | Admitting: Family Medicine

## 2022-03-06 NOTE — Telephone Encounter (Signed)
OK with HH as requested 

## 2022-03-06 NOTE — Telephone Encounter (Signed)
Home Health verbal orders ?Caller Name:Kimberely ?Agency Name: [facility] ? ?Callback number: [phone] ? ?Requesting OT/PT/Skilled nursing/Social Work/Speech: ? ?Reason:OT ? ?Frequency:0 wk for 1 wk, 1 wk for 1 wk, 1 wk for 7 wks ? ?Please forward to [facility] pool or providers CMA  ?

## 2022-03-06 NOTE — Telephone Encounter (Signed)
Spoke to [place] and relayed verbal orders, per Dr. Einar Pheasant, for OT:  ? ?0 for 1 wk, 1 for 1 wk, 1 for 7 wks ?

## 2022-03-07 ENCOUNTER — Ambulatory Visit: Payer: Managed Care, Other (non HMO)

## 2022-03-07 ENCOUNTER — Encounter: Payer: Managed Care, Other (non HMO) | Admitting: Speech Pathology

## 2022-03-07 NOTE — Telephone Encounter (Signed)
[employer] sent a fax stating the Dexcom Sensor PA was sent to [place] [facility] [place] ?

## 2022-03-08 ENCOUNTER — Other Ambulatory Visit: Payer: Self-pay

## 2022-03-08 ENCOUNTER — Other Ambulatory Visit: Payer: Self-pay | Admitting: Physical Medicine and Rehabilitation

## 2022-03-08 ENCOUNTER — Ambulatory Visit (INDEPENDENT_AMBULATORY_CARE_PROVIDER_SITE_OTHER): Payer: BC Managed Care – PPO

## 2022-03-08 DIAGNOSIS — F17210 Nicotine dependence, cigarettes, uncomplicated: Secondary | ICD-10-CM | POA: Diagnosis not present

## 2022-03-08 DIAGNOSIS — F339 Major depressive disorder, recurrent, unspecified: Secondary | ICD-10-CM | POA: Diagnosis not present

## 2022-03-08 DIAGNOSIS — I2510 Atherosclerotic heart disease of native coronary artery without angina pectoris: Secondary | ICD-10-CM | POA: Diagnosis not present

## 2022-03-08 DIAGNOSIS — E1151 Type 2 diabetes mellitus with diabetic peripheral angiopathy without gangrene: Secondary | ICD-10-CM | POA: Diagnosis not present

## 2022-03-08 DIAGNOSIS — I110 Hypertensive heart disease with heart failure: Secondary | ICD-10-CM | POA: Diagnosis not present

## 2022-03-08 DIAGNOSIS — I5022 Chronic systolic (congestive) heart failure: Secondary | ICD-10-CM | POA: Diagnosis not present

## 2022-03-08 DIAGNOSIS — I739 Peripheral vascular disease, unspecified: Secondary | ICD-10-CM

## 2022-03-08 DIAGNOSIS — Z794 Long term (current) use of insulin: Secondary | ICD-10-CM | POA: Diagnosis not present

## 2022-03-08 DIAGNOSIS — E785 Hyperlipidemia, unspecified: Secondary | ICD-10-CM | POA: Diagnosis not present

## 2022-03-08 DIAGNOSIS — Z7982 Long term (current) use of aspirin: Secondary | ICD-10-CM | POA: Diagnosis not present

## 2022-03-08 DIAGNOSIS — Z7985 Long-term (current) use of injectable non-insulin antidiabetic drugs: Secondary | ICD-10-CM | POA: Diagnosis not present

## 2022-03-08 DIAGNOSIS — Z7902 Long term (current) use of antithrombotics/antiplatelets: Secondary | ICD-10-CM | POA: Diagnosis not present

## 2022-03-08 DIAGNOSIS — I69354 Hemiplegia and hemiparesis following cerebral infarction affecting left non-dominant side: Secondary | ICD-10-CM | POA: Diagnosis not present

## 2022-03-08 DIAGNOSIS — G894 Chronic pain syndrome: Secondary | ICD-10-CM | POA: Diagnosis not present

## 2022-03-08 DIAGNOSIS — S8252XD Displaced fracture of medial malleolus of left tibia, subsequent encounter for closed fracture with routine healing: Secondary | ICD-10-CM | POA: Diagnosis not present

## 2022-03-09 ENCOUNTER — Telehealth: Payer: Self-pay | Admitting: Emergency Medicine

## 2022-03-09 NOTE — Telephone Encounter (Signed)
-----   Message from [place], PA-C sent at 03/09/2022  8:20 AM EDT ----- ?ABIs are unchanged from prior. Right side moderate lower extremity arterial disease, left moderate arterial disease. Continue medical management.  ?

## 2022-03-09 NOTE — Telephone Encounter (Signed)
Called patient, went over results and recommendations. Pt verbalized understanding, and questions, if any, were answered.  ?

## 2022-03-11 DIAGNOSIS — S8252XD Displaced fracture of medial malleolus of left tibia, subsequent encounter for closed fracture with routine healing: Secondary | ICD-10-CM | POA: Diagnosis not present

## 2022-03-12 ENCOUNTER — Other Ambulatory Visit: Payer: Self-pay | Admitting: Physical Medicine and Rehabilitation

## 2022-03-12 ENCOUNTER — Other Ambulatory Visit: Payer: Self-pay | Admitting: Endocrinology

## 2022-03-12 ENCOUNTER — Encounter: Payer: Managed Care, Other (non HMO) | Admitting: Speech Pathology

## 2022-03-12 ENCOUNTER — Ambulatory Visit: Payer: Managed Care, Other (non HMO)

## 2022-03-13 ENCOUNTER — Encounter: Payer: Self-pay | Admitting: Family Medicine

## 2022-03-13 DIAGNOSIS — I5022 Chronic systolic (congestive) heart failure: Secondary | ICD-10-CM | POA: Diagnosis not present

## 2022-03-13 DIAGNOSIS — S8252XD Displaced fracture of medial malleolus of left tibia, subsequent encounter for closed fracture with routine healing: Secondary | ICD-10-CM | POA: Diagnosis not present

## 2022-03-13 DIAGNOSIS — G894 Chronic pain syndrome: Secondary | ICD-10-CM | POA: Diagnosis not present

## 2022-03-13 DIAGNOSIS — Z7982 Long term (current) use of aspirin: Secondary | ICD-10-CM | POA: Diagnosis not present

## 2022-03-13 DIAGNOSIS — E785 Hyperlipidemia, unspecified: Secondary | ICD-10-CM | POA: Diagnosis not present

## 2022-03-13 DIAGNOSIS — Z794 Long term (current) use of insulin: Secondary | ICD-10-CM | POA: Diagnosis not present

## 2022-03-13 DIAGNOSIS — I110 Hypertensive heart disease with heart failure: Secondary | ICD-10-CM | POA: Diagnosis not present

## 2022-03-13 DIAGNOSIS — F339 Major depressive disorder, recurrent, unspecified: Secondary | ICD-10-CM | POA: Diagnosis not present

## 2022-03-13 DIAGNOSIS — I69354 Hemiplegia and hemiparesis following cerebral infarction affecting left non-dominant side: Secondary | ICD-10-CM | POA: Diagnosis not present

## 2022-03-13 DIAGNOSIS — F17210 Nicotine dependence, cigarettes, uncomplicated: Secondary | ICD-10-CM | POA: Diagnosis not present

## 2022-03-13 DIAGNOSIS — Z7985 Long-term (current) use of injectable non-insulin antidiabetic drugs: Secondary | ICD-10-CM | POA: Diagnosis not present

## 2022-03-13 DIAGNOSIS — I2510 Atherosclerotic heart disease of native coronary artery without angina pectoris: Secondary | ICD-10-CM | POA: Diagnosis not present

## 2022-03-13 DIAGNOSIS — Z7902 Long term (current) use of antithrombotics/antiplatelets: Secondary | ICD-10-CM | POA: Diagnosis not present

## 2022-03-13 DIAGNOSIS — E1151 Type 2 diabetes mellitus with diabetic peripheral angiopathy without gangrene: Secondary | ICD-10-CM | POA: Diagnosis not present

## 2022-03-14 ENCOUNTER — Encounter: Payer: Managed Care, Other (non HMO) | Admitting: Speech Pathology

## 2022-03-14 ENCOUNTER — Ambulatory Visit: Payer: Managed Care, Other (non HMO)

## 2022-03-14 NOTE — Telephone Encounter (Signed)
Please have patient schedule office visit to evaluate ?

## 2022-03-15 NOTE — Telephone Encounter (Signed)
Pt got scheduled for next week office visit ?

## 2022-03-19 ENCOUNTER — Encounter: Payer: Self-pay | Admitting: Family Medicine

## 2022-03-19 ENCOUNTER — Other Ambulatory Visit: Payer: Self-pay

## 2022-03-19 ENCOUNTER — Ambulatory Visit (INDEPENDENT_AMBULATORY_CARE_PROVIDER_SITE_OTHER): Payer: BC Managed Care – PPO | Admitting: Family Medicine

## 2022-03-19 ENCOUNTER — Ambulatory Visit: Payer: Managed Care, Other (non HMO)

## 2022-03-19 ENCOUNTER — Encounter: Payer: Managed Care, Other (non HMO) | Admitting: Speech Pathology

## 2022-03-19 VITALS — BP 102/60 | HR 76 | Temp 97.7°F | Ht 66.5 in | Wt 216.2 lb

## 2022-03-19 DIAGNOSIS — L309 Dermatitis, unspecified: Secondary | ICD-10-CM | POA: Diagnosis not present

## 2022-03-19 DIAGNOSIS — F01A0 Vascular dementia, mild, without behavioral disturbance, psychotic disturbance, mood disturbance, and anxiety: Secondary | ICD-10-CM | POA: Diagnosis not present

## 2022-03-19 DIAGNOSIS — E1169 Type 2 diabetes mellitus with other specified complication: Secondary | ICD-10-CM

## 2022-03-19 LAB — POCT GLYCOSYLATED HEMOGLOBIN (HGB A1C): Hemoglobin A1C: 6.9 % — AB (ref 4.0–5.6)

## 2022-03-19 MED ORDER — DEXCOM G6 SENSOR MISC: 1.0000 | 1 refills | Status: DC | PRN

## 2022-03-19 MED ORDER — DEXCOM G6 TRANSMITTER MISC: 1.0000 | 1 refills | Status: DC | PRN

## 2022-03-19 MED ORDER — TRULICITY 0.75 MG/0.5ML ~~LOC~~ SOAJ: 0.7500 mg | SUBCUTANEOUS | 3 refills | Status: DC

## 2022-03-19 MED ORDER — TRIAMCINOLONE ACETONIDE 0.1 % EX CREA
1.0000 | TOPICAL_CREAM | Freq: Two times a day (BID) | CUTANEOUS | 0 refills | Status: DC
Start: 2022-03-19 — End: 2022-06-11

## 2022-03-19 NOTE — Assessment & Plan Note (Signed)
Lab Results  ?Component Value Date  ? HGBA1C 6.9 (A) 03/19/2022  ? ?Using Dexacom - refill provided. Controlled. Cont trulicity 4.07 mg daily, insulin 27 units, lispro 0-10 units tid ?

## 2022-03-19 NOTE — Progress Notes (Signed)
? ?Subjective:  ? ?  ?KSENIA KUNZ is a 51 y.o. female presenting for Rash (All over her body after starting aricept ) ?  ? ? ?HPI ? ?#Rash ?- on the right and left arms ?- back ?- some spots on the ankles ?- spot on knee went away ?- new medication: donepezil approximately 1 week before the rash started ?- some are healing and others are coming up ?- treatment - benadryl cream w/o improvement ?- itching with the skin lesions ? ? ?#Diabetes ?- taking medication ?- doing well ? ?Lab Results  ?Component Value Date  ? HGBA1C 6.9 (A) 03/19/2022  ? ? ? ?Review of Systems ? ? ?Social History  ? ?Tobacco Use  ?Smoking Status Former  ? Packs/day: 0.50  ? Years: 27.00  ? Pack years: 13.50  ? Types: Cigarettes  ?Smokeless Tobacco Never  ? ? ? ?   ?Objective:  ?  ?BP Readings from Last 3 Encounters:  ?03/19/22 102/60  ?03/05/22 100/61  ?03/01/22 124/84  ? ?Wt Readings from Last 3 Encounters:  ?03/19/22 216 lb 4 oz (98.1 kg)  ?03/05/22 202 lb (91.6 kg)  ?03/01/22 214 lb 6.4 oz (97.3 kg)  ? ? ?BP 102/60   Pulse 76   Temp 97.7 ?F (36.5 ?C) (Oral)   Ht 5' 6.5" (1.689 m)   Wt 216 lb 4 oz (98.1 kg)   LMP  (LMP Unknown)   SpO2 96%   BMI 34.38 kg/m?  ? ? ?Physical Exam ?Constitutional:   ?   General: She is not in acute distress. ?   Appearance: She is well-developed. She is not diaphoretic.  ?HENT:  ?   Right Ear: External ear normal.  ?   Left Ear: External ear normal.  ?Eyes:  ?   Conjunctiva/sclera: Conjunctivae normal.  ?Cardiovascular:  ?   Rate and Rhythm: Normal rate.  ?Pulmonary:  ?   Effort: Pulmonary effort is normal.  ?Musculoskeletal:  ?   Cervical back: Neck supple.  ?Skin: ?   General: Skin is warm and dry.  ?   Capillary Refill: Capillary refill takes less than 2 seconds.  ?   Comments: Scattered raised erythematous wheels on the back and b/l [name]. The left arm has several crusted pustules.   ?Neurological:  ?   Mental Status: She is alert. Mental status is at baseline.  ?   Comments: Partial paralysis of  left arm  ?Psychiatric:     ?   Mood and Affect: Mood normal.     ?   Behavior: Behavior normal.  ? ? ? ? ? ?   ?Assessment & Plan:  ? ?Problem List Items Addressed This Visit   ? ?  ? Endocrine  ? Type 2 diabetes mellitus with other specified complication ([place])  ?  Lab Results  ?Component Value Date  ? HGBA1C 6.9 (A) 03/19/2022  ?Using Dexacom - refill provided. Controlled. Cont trulicity 5.39 mg daily, insulin 27 units, lispro 0-10 units tid ?  ?  ? Relevant Medications  ? Continuous Blood Gluc Transmit (DEXCOM G6 TRANSMITTER) MISC  ? Continuous Blood Gluc Sensor (DEXCOM G6 SENSOR) MISC  ? Dulaglutide (TRULICITY) 7.67 HA/1.9FX SOPN  ? Other Relevant Orders  ? POCT glycosylated hemoglobin (Hb A1C) (Completed)  ?  ? Nervous and Auditory  ? Mild vascular dementia without behavioral disturbance, psychotic disturbance, mood disturbance, or anxiety  ?  Skin reaction potentially 2/2 to donepezil. Stop medication. Restart 1 week after skin symptoms resolve. If rash  recurs contact neurology.  ?  ?  ?  ? Musculoskeletal and Integument  ? Dermatitis - Primary  ?  Suspect it may be drug related since recently starting donepezil. Stop medication. Use triamcinolone for skin. Discussed avoiding central medications due to her hx of AMS and polypharmacy. If rash improves can restart donepezil to verify if it is the source. If rash does not improve will likely consider derm referral.  ?  ?  ? Relevant Medications  ? triamcinolone cream (KENALOG) 0.1 %  ? ? ? ?Return in about 3 months (around 06/19/2022) for diabetes. ? ?Lesleigh Noe, MD ? ?This visit occurred during the SARS-CoV-2 public health emergency.  Safety protocols were in place, including screening questions prior to the visit, additional usage of staff PPE, and extensive cleaning of exam room while observing appropriate contact time as indicated for disinfecting solutions.  ? ?

## 2022-03-19 NOTE — Assessment & Plan Note (Signed)
Suspect it may be drug related since recently starting donepezil. Stop medication. Use triamcinolone for skin. Discussed avoiding central medications due to her hx of AMS and polypharmacy. If rash improves can restart donepezil to verify if it is the source. If rash does not improve will likely consider derm referral.  ?

## 2022-03-19 NOTE — Assessment & Plan Note (Signed)
Skin reaction potentially 2/2 to donepezil. Stop medication. Restart 1 week after skin symptoms resolve. If rash recurs contact neurology.  ?

## 2022-03-19 NOTE — Patient Instructions (Addendum)
Rash ?- use triamcinolone ointment twice daily to any spots ?- Stop the Donepezil -- do not restart until rash resolves and is gone for at least 1 week ? ?Diabetes ?- return 3 months ? ? ?

## 2022-03-21 ENCOUNTER — Encounter: Payer: Managed Care, Other (non HMO) | Admitting: Speech Pathology

## 2022-03-21 ENCOUNTER — Ambulatory Visit: Payer: Managed Care, Other (non HMO)

## 2022-03-21 DIAGNOSIS — E785 Hyperlipidemia, unspecified: Secondary | ICD-10-CM | POA: Diagnosis not present

## 2022-03-21 DIAGNOSIS — Z7985 Long-term (current) use of injectable non-insulin antidiabetic drugs: Secondary | ICD-10-CM | POA: Diagnosis not present

## 2022-03-21 DIAGNOSIS — F17210 Nicotine dependence, cigarettes, uncomplicated: Secondary | ICD-10-CM | POA: Diagnosis not present

## 2022-03-21 DIAGNOSIS — G894 Chronic pain syndrome: Secondary | ICD-10-CM | POA: Diagnosis not present

## 2022-03-21 DIAGNOSIS — Z7982 Long term (current) use of aspirin: Secondary | ICD-10-CM | POA: Diagnosis not present

## 2022-03-21 DIAGNOSIS — Z794 Long term (current) use of insulin: Secondary | ICD-10-CM | POA: Diagnosis not present

## 2022-03-21 DIAGNOSIS — I2510 Atherosclerotic heart disease of native coronary artery without angina pectoris: Secondary | ICD-10-CM | POA: Diagnosis not present

## 2022-03-21 DIAGNOSIS — I69354 Hemiplegia and hemiparesis following cerebral infarction affecting left non-dominant side: Secondary | ICD-10-CM | POA: Diagnosis not present

## 2022-03-21 DIAGNOSIS — Z7902 Long term (current) use of antithrombotics/antiplatelets: Secondary | ICD-10-CM | POA: Diagnosis not present

## 2022-03-21 DIAGNOSIS — E1151 Type 2 diabetes mellitus with diabetic peripheral angiopathy without gangrene: Secondary | ICD-10-CM | POA: Diagnosis not present

## 2022-03-21 DIAGNOSIS — F339 Major depressive disorder, recurrent, unspecified: Secondary | ICD-10-CM | POA: Diagnosis not present

## 2022-03-21 DIAGNOSIS — S8252XD Displaced fracture of medial malleolus of left tibia, subsequent encounter for closed fracture with routine healing: Secondary | ICD-10-CM | POA: Diagnosis not present

## 2022-03-21 DIAGNOSIS — I5022 Chronic systolic (congestive) heart failure: Secondary | ICD-10-CM | POA: Diagnosis not present

## 2022-03-21 DIAGNOSIS — I110 Hypertensive heart disease with heart failure: Secondary | ICD-10-CM | POA: Diagnosis not present

## 2022-03-26 ENCOUNTER — Ambulatory Visit: Payer: Self-pay

## 2022-03-27 DIAGNOSIS — I5032 Chronic diastolic (congestive) heart failure: Secondary | ICD-10-CM | POA: Diagnosis not present

## 2022-03-27 DIAGNOSIS — I6350 Cerebral infarction due to unspecified occlusion or stenosis of unspecified cerebral artery: Secondary | ICD-10-CM | POA: Diagnosis not present

## 2022-03-27 DIAGNOSIS — E1169 Type 2 diabetes mellitus with other specified complication: Secondary | ICD-10-CM | POA: Diagnosis not present

## 2022-03-28 ENCOUNTER — Ambulatory Visit: Payer: Self-pay

## 2022-03-28 ENCOUNTER — Encounter: Payer: Self-pay | Admitting: Speech Pathology

## 2022-03-29 DIAGNOSIS — Z7902 Long term (current) use of antithrombotics/antiplatelets: Secondary | ICD-10-CM | POA: Diagnosis not present

## 2022-03-29 DIAGNOSIS — Z794 Long term (current) use of insulin: Secondary | ICD-10-CM | POA: Diagnosis not present

## 2022-03-29 DIAGNOSIS — F339 Major depressive disorder, recurrent, unspecified: Secondary | ICD-10-CM | POA: Diagnosis not present

## 2022-03-29 DIAGNOSIS — S8252XD Displaced fracture of medial malleolus of left tibia, subsequent encounter for closed fracture with routine healing: Secondary | ICD-10-CM | POA: Diagnosis not present

## 2022-03-29 DIAGNOSIS — I5022 Chronic systolic (congestive) heart failure: Secondary | ICD-10-CM | POA: Diagnosis not present

## 2022-03-29 DIAGNOSIS — E785 Hyperlipidemia, unspecified: Secondary | ICD-10-CM | POA: Diagnosis not present

## 2022-03-29 DIAGNOSIS — I69354 Hemiplegia and hemiparesis following cerebral infarction affecting left non-dominant side: Secondary | ICD-10-CM | POA: Diagnosis not present

## 2022-03-29 DIAGNOSIS — I110 Hypertensive heart disease with heart failure: Secondary | ICD-10-CM | POA: Diagnosis not present

## 2022-03-29 DIAGNOSIS — Z7985 Long-term (current) use of injectable non-insulin antidiabetic drugs: Secondary | ICD-10-CM | POA: Diagnosis not present

## 2022-03-29 DIAGNOSIS — F17210 Nicotine dependence, cigarettes, uncomplicated: Secondary | ICD-10-CM | POA: Diagnosis not present

## 2022-03-29 DIAGNOSIS — I2510 Atherosclerotic heart disease of native coronary artery without angina pectoris: Secondary | ICD-10-CM | POA: Diagnosis not present

## 2022-03-29 DIAGNOSIS — Z7982 Long term (current) use of aspirin: Secondary | ICD-10-CM | POA: Diagnosis not present

## 2022-03-29 DIAGNOSIS — G894 Chronic pain syndrome: Secondary | ICD-10-CM | POA: Diagnosis not present

## 2022-03-29 DIAGNOSIS — E1151 Type 2 diabetes mellitus with diabetic peripheral angiopathy without gangrene: Secondary | ICD-10-CM | POA: Diagnosis not present

## 2022-03-30 DIAGNOSIS — S8252XD Displaced fracture of medial malleolus of left tibia, subsequent encounter for closed fracture with routine healing: Secondary | ICD-10-CM | POA: Diagnosis not present

## 2022-03-30 DIAGNOSIS — I110 Hypertensive heart disease with heart failure: Secondary | ICD-10-CM | POA: Diagnosis not present

## 2022-03-30 DIAGNOSIS — F339 Major depressive disorder, recurrent, unspecified: Secondary | ICD-10-CM | POA: Diagnosis not present

## 2022-03-30 DIAGNOSIS — I5022 Chronic systolic (congestive) heart failure: Secondary | ICD-10-CM | POA: Diagnosis not present

## 2022-03-30 DIAGNOSIS — E1151 Type 2 diabetes mellitus with diabetic peripheral angiopathy without gangrene: Secondary | ICD-10-CM | POA: Diagnosis not present

## 2022-03-30 DIAGNOSIS — I2510 Atherosclerotic heart disease of native coronary artery without angina pectoris: Secondary | ICD-10-CM | POA: Diagnosis not present

## 2022-03-30 DIAGNOSIS — F17210 Nicotine dependence, cigarettes, uncomplicated: Secondary | ICD-10-CM | POA: Diagnosis not present

## 2022-03-30 DIAGNOSIS — Z7982 Long term (current) use of aspirin: Secondary | ICD-10-CM | POA: Diagnosis not present

## 2022-03-30 DIAGNOSIS — Z7902 Long term (current) use of antithrombotics/antiplatelets: Secondary | ICD-10-CM | POA: Diagnosis not present

## 2022-03-30 DIAGNOSIS — Z794 Long term (current) use of insulin: Secondary | ICD-10-CM | POA: Diagnosis not present

## 2022-03-30 DIAGNOSIS — I69354 Hemiplegia and hemiparesis following cerebral infarction affecting left non-dominant side: Secondary | ICD-10-CM | POA: Diagnosis not present

## 2022-03-30 DIAGNOSIS — E785 Hyperlipidemia, unspecified: Secondary | ICD-10-CM | POA: Diagnosis not present

## 2022-03-30 DIAGNOSIS — G894 Chronic pain syndrome: Secondary | ICD-10-CM | POA: Diagnosis not present

## 2022-03-30 DIAGNOSIS — Z7985 Long-term (current) use of injectable non-insulin antidiabetic drugs: Secondary | ICD-10-CM | POA: Diagnosis not present

## 2022-04-02 ENCOUNTER — Ambulatory Visit: Payer: Self-pay

## 2022-04-02 ENCOUNTER — Telehealth: Payer: Self-pay

## 2022-04-02 DIAGNOSIS — I69354 Hemiplegia and hemiparesis following cerebral infarction affecting left non-dominant side: Secondary | ICD-10-CM | POA: Diagnosis not present

## 2022-04-02 DIAGNOSIS — Z794 Long term (current) use of insulin: Secondary | ICD-10-CM | POA: Diagnosis not present

## 2022-04-02 DIAGNOSIS — G894 Chronic pain syndrome: Secondary | ICD-10-CM | POA: Diagnosis not present

## 2022-04-02 DIAGNOSIS — E785 Hyperlipidemia, unspecified: Secondary | ICD-10-CM | POA: Diagnosis not present

## 2022-04-02 DIAGNOSIS — F01A0 Vascular dementia, mild, without behavioral disturbance, psychotic disturbance, mood disturbance, and anxiety: Secondary | ICD-10-CM

## 2022-04-02 DIAGNOSIS — I2510 Atherosclerotic heart disease of native coronary artery without angina pectoris: Secondary | ICD-10-CM | POA: Diagnosis not present

## 2022-04-02 DIAGNOSIS — Z7982 Long term (current) use of aspirin: Secondary | ICD-10-CM | POA: Diagnosis not present

## 2022-04-02 DIAGNOSIS — F17210 Nicotine dependence, cigarettes, uncomplicated: Secondary | ICD-10-CM | POA: Diagnosis not present

## 2022-04-02 DIAGNOSIS — Z7985 Long-term (current) use of injectable non-insulin antidiabetic drugs: Secondary | ICD-10-CM | POA: Diagnosis not present

## 2022-04-02 DIAGNOSIS — S8252XD Displaced fracture of medial malleolus of left tibia, subsequent encounter for closed fracture with routine healing: Secondary | ICD-10-CM | POA: Diagnosis not present

## 2022-04-02 DIAGNOSIS — I5022 Chronic systolic (congestive) heart failure: Secondary | ICD-10-CM | POA: Diagnosis not present

## 2022-04-02 DIAGNOSIS — I110 Hypertensive heart disease with heart failure: Secondary | ICD-10-CM | POA: Diagnosis not present

## 2022-04-02 DIAGNOSIS — F339 Major depressive disorder, recurrent, unspecified: Secondary | ICD-10-CM | POA: Diagnosis not present

## 2022-04-02 DIAGNOSIS — E1151 Type 2 diabetes mellitus with diabetic peripheral angiopathy without gangrene: Secondary | ICD-10-CM | POA: Diagnosis not present

## 2022-04-02 DIAGNOSIS — Z7902 Long term (current) use of antithrombotics/antiplatelets: Secondary | ICD-10-CM | POA: Diagnosis not present

## 2022-04-02 NOTE — Telephone Encounter (Signed)
Erika [name] from [facility] home health wanted to let you know that due to changes in [place] patient is no longer in care area. Will need to have new order placed for another provider for  ?OT /PT home health. She will be discharged this week.  ?

## 2022-04-03 DIAGNOSIS — F17210 Nicotine dependence, cigarettes, uncomplicated: Secondary | ICD-10-CM | POA: Diagnosis not present

## 2022-04-03 DIAGNOSIS — S8252XD Displaced fracture of medial malleolus of left tibia, subsequent encounter for closed fracture with routine healing: Secondary | ICD-10-CM | POA: Diagnosis not present

## 2022-04-03 DIAGNOSIS — E785 Hyperlipidemia, unspecified: Secondary | ICD-10-CM | POA: Diagnosis not present

## 2022-04-03 DIAGNOSIS — Z7982 Long term (current) use of aspirin: Secondary | ICD-10-CM | POA: Diagnosis not present

## 2022-04-03 DIAGNOSIS — I2510 Atherosclerotic heart disease of native coronary artery without angina pectoris: Secondary | ICD-10-CM | POA: Diagnosis not present

## 2022-04-03 DIAGNOSIS — I110 Hypertensive heart disease with heart failure: Secondary | ICD-10-CM | POA: Diagnosis not present

## 2022-04-03 DIAGNOSIS — I69354 Hemiplegia and hemiparesis following cerebral infarction affecting left non-dominant side: Secondary | ICD-10-CM | POA: Diagnosis not present

## 2022-04-03 DIAGNOSIS — I5022 Chronic systolic (congestive) heart failure: Secondary | ICD-10-CM | POA: Diagnosis not present

## 2022-04-03 DIAGNOSIS — Z7902 Long term (current) use of antithrombotics/antiplatelets: Secondary | ICD-10-CM | POA: Diagnosis not present

## 2022-04-03 DIAGNOSIS — G894 Chronic pain syndrome: Secondary | ICD-10-CM | POA: Diagnosis not present

## 2022-04-03 DIAGNOSIS — Z794 Long term (current) use of insulin: Secondary | ICD-10-CM | POA: Diagnosis not present

## 2022-04-03 DIAGNOSIS — E1151 Type 2 diabetes mellitus with diabetic peripheral angiopathy without gangrene: Secondary | ICD-10-CM | POA: Diagnosis not present

## 2022-04-03 DIAGNOSIS — F339 Major depressive disorder, recurrent, unspecified: Secondary | ICD-10-CM | POA: Diagnosis not present

## 2022-04-03 DIAGNOSIS — Z7985 Long-term (current) use of injectable non-insulin antidiabetic drugs: Secondary | ICD-10-CM | POA: Diagnosis not present

## 2022-04-03 NOTE — Telephone Encounter (Signed)
New referral placed based on visits in March.  ? ?Routing to referral coordinator ?

## 2022-04-03 NOTE — Addendum Note (Signed)
Addended by: Lesleigh Noe on: 04/03/2022 03:39 PM ? ? Modules accepted: Orders ? ?

## 2022-04-04 ENCOUNTER — Encounter: Payer: Self-pay | Admitting: Speech Pathology

## 2022-04-04 ENCOUNTER — Ambulatory Visit: Payer: Self-pay

## 2022-04-07 ENCOUNTER — Other Ambulatory Visit: Payer: Self-pay | Admitting: Physical Medicine & Rehabilitation

## 2022-04-07 ENCOUNTER — Other Ambulatory Visit (HOSPITAL_COMMUNITY): Payer: Self-pay | Admitting: Psychiatry

## 2022-04-07 ENCOUNTER — Other Ambulatory Visit: Payer: Self-pay | Admitting: Physical Medicine and Rehabilitation

## 2022-04-07 ENCOUNTER — Other Ambulatory Visit: Payer: Self-pay | Admitting: Cardiovascular Disease

## 2022-04-07 DIAGNOSIS — G629 Polyneuropathy, unspecified: Secondary | ICD-10-CM

## 2022-04-07 DIAGNOSIS — F331 Major depressive disorder, recurrent, moderate: Secondary | ICD-10-CM

## 2022-04-09 ENCOUNTER — Ambulatory Visit: Payer: Self-pay

## 2022-04-09 ENCOUNTER — Other Ambulatory Visit: Payer: Self-pay | Admitting: Physical Medicine and Rehabilitation

## 2022-04-11 ENCOUNTER — Encounter: Payer: Self-pay | Admitting: Speech Pathology

## 2022-04-11 ENCOUNTER — Ambulatory Visit: Payer: Self-pay

## 2022-04-11 DIAGNOSIS — S8252XD Displaced fracture of medial malleolus of left tibia, subsequent encounter for closed fracture with routine healing: Secondary | ICD-10-CM | POA: Diagnosis not present

## 2022-04-13 ENCOUNTER — Telehealth (HOSPITAL_COMMUNITY): Payer: BC Managed Care – PPO | Admitting: Psychiatry

## 2022-04-15 ENCOUNTER — Other Ambulatory Visit (HOSPITAL_COMMUNITY): Payer: Self-pay | Admitting: Psychiatry

## 2022-04-15 DIAGNOSIS — F4310 Post-traumatic stress disorder, unspecified: Secondary | ICD-10-CM

## 2022-04-15 DIAGNOSIS — F331 Major depressive disorder, recurrent, moderate: Secondary | ICD-10-CM

## 2022-04-16 ENCOUNTER — Ambulatory Visit: Payer: Self-pay

## 2022-04-18 ENCOUNTER — Telehealth (HOSPITAL_BASED_OUTPATIENT_CLINIC_OR_DEPARTMENT_OTHER): Payer: BC Managed Care – PPO | Admitting: Psychiatry

## 2022-04-18 ENCOUNTER — Encounter (HOSPITAL_COMMUNITY): Payer: Self-pay | Admitting: Psychiatry

## 2022-04-18 ENCOUNTER — Encounter: Payer: Self-pay | Admitting: Speech Pathology

## 2022-04-18 ENCOUNTER — Ambulatory Visit: Payer: Self-pay

## 2022-04-18 DIAGNOSIS — F4310 Post-traumatic stress disorder, unspecified: Secondary | ICD-10-CM | POA: Diagnosis not present

## 2022-04-18 DIAGNOSIS — F331 Major depressive disorder, recurrent, moderate: Secondary | ICD-10-CM

## 2022-04-18 MED ORDER — ESCITALOPRAM OXALATE 20 MG PO TABS: 20.0000 mg | ORAL_TABLET | Freq: Every day | ORAL | 2 refills | Status: DC

## 2022-04-18 MED ORDER — CHLORPROMAZINE HCL 25 MG PO TABS: 25.0000 mg | ORAL_TABLET | Freq: Every day | ORAL | 2 refills | Status: DC

## 2022-04-18 MED ORDER — LAMOTRIGINE 200 MG PO TABS: 200.0000 mg | ORAL_TABLET | Freq: Every day | ORAL | 2 refills | Status: DC

## 2022-04-18 MED ORDER — CLONAZEPAM 0.25 MG PO TBDP: 0.2500 mg | ORAL_TABLET | Freq: Two times a day (BID) | ORAL | 2 refills | Status: DC
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
## 2022-04-19 ENCOUNTER — Encounter: Payer: Self-pay | Admitting: Cardiovascular Disease

## 2022-04-19 ENCOUNTER — Ambulatory Visit (INDEPENDENT_AMBULATORY_CARE_PROVIDER_SITE_OTHER): Payer: BC Managed Care – PPO | Admitting: Cardiovascular Disease

## 2022-04-19 VITALS — BP 106/64 | HR 71 | Ht 67.0 in | Wt 223.2 lb

## 2022-04-19 DIAGNOSIS — I779 Disorder of arteries and arterioles, unspecified: Secondary | ICD-10-CM

## 2022-04-19 DIAGNOSIS — E785 Hyperlipidemia, unspecified: Secondary | ICD-10-CM

## 2022-04-19 DIAGNOSIS — I25118 Atherosclerotic heart disease of native coronary artery with other forms of angina pectoris: Secondary | ICD-10-CM

## 2022-04-19 DIAGNOSIS — Z720 Tobacco use: Secondary | ICD-10-CM

## 2022-04-19 DIAGNOSIS — I739 Peripheral vascular disease, unspecified: Secondary | ICD-10-CM

## 2022-04-19 DIAGNOSIS — I10 Essential (primary) hypertension: Secondary | ICD-10-CM | POA: Diagnosis not present

## 2022-04-19 DIAGNOSIS — I5032 Chronic diastolic (congestive) heart failure: Secondary | ICD-10-CM

## 2022-04-19 DIAGNOSIS — I2510 Atherosclerotic heart disease of native coronary artery without angina pectoris: Secondary | ICD-10-CM

## 2022-04-19 NOTE — Progress Notes (Signed)
?  ?Cardiology Office Note ? ? ?Date:  04/19/2022  ? ?ID:  Erika [name], DOB 08/11/1971, MRN [id number] ? ?PCP:  Lesleigh Noe, MD ?Cardiologist:   [name], MD  ? ?Chief Complaint  ?Patient presents with  ? Other  ?  No complaints today. Meds reviewed verbally with pt.  ? ? ?  ?History of Present Illness: ?Erika [name] is a 51 y.o. female who presents for a follow-up visit  regarding extensive cardiovascular history. ?She has known history of coronary artery disease with previous non-ST elevation myocardial infarction in 2017.  She was found to have three-vessel coronary artery disease at that time and underwent CABG and right carotid endarterectomy. ?She was rehospitalized in September, 2017 with chest pain in the setting of uncontrolled hypertension. Cardiac catheterization showed patent grafts . The LAD had diffuse disease distal to the anastomosis and was very small in caliber. Ejection fraction was normal with mildly elevated left ventricular end-diastolic pressure.  ?  ?She has history of difficult to control hypertension with labile blood pressure.  No evidence of renal artery stenosis on previous angiography.  She has known history of severe mixed hyperlipidemia . ?  ?She is known to have peripheral arterial disease with previous stenting of the left SFA. She was subsequently found to have occluded left SFA.  She subsequently had right leg claudication and was found to have an occluded right SFA.   ? ?She underwent repeat cardiac catheterization in September 2021 due to worsening angina.  Cardiac cath showed progression of native coronary artery disease but patent grafts with no obstructive disease.  Medical therapy was recommended.   ? ?She was hospitalized in July of 2022 with acute stroke.  She was found to have mildly elevated troponin.  Her symptoms included facial droop and left-sided weakness.  Her stroke was treated medically.  Elevated troponin was felt to be due to demand  ischemia.  Echocardiogram was done which showed normal LV systolic function with no significant valvular abnormalities.  Hospitalization was complicated by aspiration pneumonia.  ? ?She had an echocardiogram done in February which showed normal LV systolic function with mild mitral regurgitation.  Cardiac catheterization done on February 6 showed significant underlying three-vessel coronary artery disease with patent grafts including LIMA to LAD, SVG to diagonal/OM and SVG to right PDA.  There was mild to moderate distal LAD disease post LIMA anastomosis. ? ?She has been doing well with no recent chest pain, shortness of breath or palpitations.  Her mobility is limited due to the left-sided weakness related to prior stroke.  She walks with a walker.  No claudication. ? ? ?Past Medical History:  ?Diagnosis Date  ? Arthralgia of temporomandibular joint   ? CAD, multiple vessel   ? a. 06/2016 Cath: ostLM 40%, ostLAD 40%, pLAD 95%, ost-pLCx 60%, pLCx 95%, mLCx 60%, mRCA 95%, D2 50%, LVSF nl;  b. 07/2016 CABG x 4 (LIMA->LAD, VG->Diag, VG->OM, VG->RCA); c. 08/2016 Cath: 3VD w/ 4/4 patent grafts. LAD distal to LIMA has diff dzs->Med rx; d. 08/2020 Cath: 4/4 patent grafts, native 3VD. EF 55-65%-->Med Rx.  ? Carotid arterial disease ([place])   ? a. 07/2016 s/p R CEA; b. 02/2021 U/S: RICA 40-98%, LICA 1-19%.  ? Clotting disorder ([place])   ? Depression   ? Diastolic dysfunction   ? a. 06/2016 Echo: EF 50-55%, mild inf wall HK, GR1DD, mild MR, RV sys fxn nl, mildly dilated LA, PASP nl; b. 06/2021 Echo: EF 60-65%, no rwma. Nl RV  fxn.  ? Fatty liver disease, nonalcoholic 2774  ? History of blood transfusion   ? with heart surgery  ? HLD (hyperlipidemia)   ? Labile hypertension   ? a. prior renal ngiogram negative for RAS in 03/2016; b. catecholamines and metanephrines normal, mildly elevated renin with normal aldosterone and normal ratio in 02/2016  ? Myocardial infarction [facility]) 2017  ? Obesity   ? PAD (peripheral artery disease) ([place])   ? a.  09/2018 s/p L SFA stenting; b. 07/2019 Periph Angio: Patent m/d L SFA stent w/ 100% L SFA distal to stent. L AT 100d, L Peroneal diff dzs-->Med Rx; c. 02/2021 ABIs: stable @ 0.61 on R and 0.46 on L.  ? PTSD (post-traumatic stress disorder)   ? Tobacco abuse   ? Type 2 diabetes mellitus ([place]) 12/2015  ? ? ?Past Surgical History:  ?Procedure Laterality Date  ? ABDOMINAL AORTOGRAM W/LOWER EXTREMITY [name] 10/15/2018  ? Procedure: ABDOMINAL AORTOGRAM W/LOWER EXTREMITY;  Surgeon: Wellington Hampshire, MD;  Location: [place] [facility];  Service: Cardiovascular;  Laterality: [name];  ? ABDOMINAL AORTOGRAM W/LOWER EXTREMITY Bilateral 08/19/2019  ? Procedure: ABDOMINAL AORTOGRAM W/LOWER EXTREMITY;  Surgeon: Wellington Hampshire, MD;  Location: [place] [facility];  Service: Cardiovascular;  Laterality: Bilateral;  ? CARDIAC CATHETERIZATION [name] 06/29/2016  ? Procedure: Left Heart Cath and Coronary Angiography;  Surgeon: Minna Merritts, MD;  Location: [place] [facility];  Service: Cardiovascular;  Laterality: [name];  ? CARDIAC CATHETERIZATION [name] 08/29/2016  ? Procedure: Left Heart Cath and Cors/Grafts Angiography;  Surgeon: Wellington Hampshire, MD;  Location: [place] [facility];  Service: Cardiovascular;  Laterality: [name];  ? CESAREAN SECTION    ? CHOLECYSTECTOMY    ? CORONARY ARTERY BYPASS GRAFT [name] 07/06/2016  ? Procedure: CORONARY ARTERY BYPASS GRAFTING (CABG) x four, using left internal mammary artery and right leg greater saphenous vein harvested endoscopically;  Surgeon: Ivin Poot, MD;  Location: [place];  Service: Open Heart Surgery;  Laterality: [name];  ? ENDARTERECTOMY Right 07/06/2016  ? Procedure: ENDARTERECTOMY CAROTID;  Surgeon: Rosetta Posner, MD;  Location: [place];  Service: Vascular;  Laterality: Right;  ? ENDARTERECTOMY Right 04/27/2020  ? Procedure: REDO OF RIGHT ENDARTERECTOMY CAROTID;  Surgeon: Rosetta Posner, MD;  Location: [place];  Service: Vascular;  Laterality: Right;  ? LEFT HEART CATH AND CORS/GRAFTS ANGIOGRAPHY [name] 08/24/2020  ?  Procedure: LEFT HEART CATH AND CORS/GRAFTS ANGIOGRAPHY;  Surgeon: Wellington Hampshire, MD;  Location: [place] [facility];  Service: Cardiovascular;  Laterality: [name];  ? LEFT HEART CATH AND CORS/GRAFTS ANGIOGRAPHY [name] 01/29/2022  ? Procedure: LEFT HEART CATH AND CORS/GRAFTS ANGIOGRAPHY;  Surgeon: Wellington Hampshire, MD;  Location: [place] [facility];  Service: Cardiovascular;  Laterality: [name];  ? PERIPHERAL VASCULAR CATHETERIZATION [name] 04/18/2016  ? Procedure: Renal Angiography;  Surgeon: Wellington Hampshire, MD;  Location: [place] [facility];  Service: Cardiovascular;  Laterality: [name];  ? PERIPHERAL VASCULAR INTERVENTION Left 10/15/2018  ? Procedure: PERIPHERAL VASCULAR INTERVENTION;  Surgeon: Wellington Hampshire, MD;  Location: [place] [facility];  Service: Cardiovascular;  Laterality: Left;  Left superficial femoral  ? TEE WITHOUT CARDIOVERSION [name] 07/06/2016  ? Procedure: TRANSESOPHAGEAL ECHOCARDIOGRAM (TEE);  Surgeon: Ivin Poot, MD;  Location: [place];  Service: Open Heart Surgery;  Laterality: [name];  ? TONSILLECTOMY    ? ? ? ?Current Outpatient Medications  ?Medication Sig Dispense Refill  ? acetaminophen (TYLENOL) 325 MG tablet Take 1-2 tablets (325-650 mg total) by mouth  every 4 (four) hours as needed for mild pain.    ? albuterol (VENTOLIN HFA) 108 (90 Base) MCG/ACT inhaler Inhale 2 puffs into the lungs every 6 (six) hours as needed for wheezing. 18 g 0  ? ascorbic acid (VITAMIN C) 500 MG tablet Take 1 tablet (500 mg total) by mouth daily. 30 tablet 2  ? aspirin 81 MG chewable tablet Chew 1 tablet (81 mg total) by mouth daily with breakfast. 120 tablet 1  ? butalbital-acetaminophen-caffeine (FIORICET) 50-325-40 MG tablet TAKE ONE TABLET BY MOUTH ONE TIME DAILY AS NEEDED FOR HEADACHE 30 tablet 0  ? carvedilol (COREG) 6.25 MG tablet Take 1 tablet (6.25 mg total) by mouth 2 (two) times daily. 180 tablet 0  ? chlorproMAZINE (THORAZINE) 25 MG tablet Take 1 tablet (25 mg total) by mouth at bedtime. 30 tablet 2  ?  clonazePAM (KLONOPIN) 0.25 MG disintegrating tablet Take 1 tablet (0.25 mg total) by mouth 2 (two) times daily. 60 tablet 2  ? clopidogrel (PLAVIX) 75 MG tablet Take 1 tablet (75 mg total) by mouth daily. 90 ta

## 2022-04-19 NOTE — Patient Instructions (Signed)
Medication Instructions:  ?Your physician recommends that you continue on your current medications as directed. Please refer to the Current Medication list given to you today. ? ?*If you need a refill on your cardiac medications before your next appointment, please call your pharmacy* ? ? ?Lab Work: ?Lipid and Lft  ? ?Please have your labs drawn at the [facility]. ?No appt needed. Lab hours Mon-Fri 7am-6pm ? ?If you have labs (blood work) drawn today and your tests are completely normal, you will receive your results only by: ?MyChart Message (if you have MyChart) OR ?A paper copy in the mail ?If you have any lab test that is abnormal or we need to change your treatment, we will call you to review the results. ? ? ?Testing/Procedures: ?Your physician has requested that you have a carotid duplex. This test is an ultrasound of the carotid arteries in your neck. It looks at blood flow through these arteries that supply the brain with blood. Allow one hour for this exam. There are no restrictions or special instructions.  ? ? ?Follow-Up: ?At [facility], you and your health needs are our priority.  As part of our continuing mission to provide you with exceptional heart care, we have created designated Provider Care Teams.  These Care Teams include your primary Cardiologist (physician) and Advanced Practice Providers (APPs -  Physician Assistants and Nurse Practitioners) who all work together to provide you with the care you need, when you need it. ? ?We recommend signing up for the patient portal called "MyChart".  Sign up information is provided on this After Visit Summary.  MyChart is used to connect with patients for Virtual Visits (Telemedicine).  Patients are able to view lab/test results, encounter notes, upcoming appointments, etc.  Non-urgent messages can be sent to your provider as well.   ?To learn more about what you can do with MyChart, go to [email or web address].   ? ?Your next appointment:    ?Your physician wants you to follow-up in: 6 months You will receive a reminder letter in the mail two months in advance. If you don't receive a letter, please call our office to schedule the follow-up appointment. ? ? ?The format for your next appointment:   ?Your physician wants you to follow-up in: 6 months You will receive a reminder letter in the mail two months in advance. If you don't receive a letter, please call our office to schedule the follow-up appointment. ? ? ?Provider:   ?You may see [name], MD or one of the following Advanced Practice Providers on your designated Care Team:   ?Murray Hodgkins, NP ?Christell Faith, PA-C ?Cadence Kathlen Mody, PA-C  ? ? ?Other Instructions ?N /A ? ?Important Information About [place] ? ? ? ? ? ? ?

## 2022-04-23 ENCOUNTER — Ambulatory Visit: Payer: Self-pay

## 2022-04-24 NOTE — Telephone Encounter (Signed)
Still working on placement with [facility] ?

## 2022-04-25 ENCOUNTER — Ambulatory Visit: Payer: Self-pay

## 2022-04-25 ENCOUNTER — Encounter: Payer: Self-pay | Admitting: Speech Pathology

## 2022-05-02 NOTE — Telephone Encounter (Signed)
Pt placed with [facility] - they are setting up appt ?

## 2022-05-03 ENCOUNTER — Telehealth: Payer: Self-pay | Admitting: Family Medicine

## 2022-05-03 DIAGNOSIS — R21 Rash and other nonspecific skin eruption: Secondary | ICD-10-CM | POA: Diagnosis not present

## 2022-05-03 DIAGNOSIS — L309 Dermatitis, unspecified: Secondary | ICD-10-CM | POA: Diagnosis not present

## 2022-05-03 DIAGNOSIS — F331 Major depressive disorder, recurrent, moderate: Secondary | ICD-10-CM | POA: Diagnosis not present

## 2022-05-03 DIAGNOSIS — E1151 Type 2 diabetes mellitus with diabetic peripheral angiopathy without gangrene: Secondary | ICD-10-CM | POA: Diagnosis not present

## 2022-05-03 DIAGNOSIS — F4310 Post-traumatic stress disorder, unspecified: Secondary | ICD-10-CM | POA: Diagnosis not present

## 2022-05-03 DIAGNOSIS — R634 Abnormal weight loss: Secondary | ICD-10-CM | POA: Diagnosis not present

## 2022-05-03 DIAGNOSIS — G4700 Insomnia, unspecified: Secondary | ICD-10-CM | POA: Diagnosis not present

## 2022-05-03 DIAGNOSIS — I69354 Hemiplegia and hemiparesis following cerebral infarction affecting left non-dominant side: Secondary | ICD-10-CM | POA: Diagnosis not present

## 2022-05-03 DIAGNOSIS — I2510 Atherosclerotic heart disease of native coronary artery without angina pectoris: Secondary | ICD-10-CM | POA: Diagnosis not present

## 2022-05-03 DIAGNOSIS — E785 Hyperlipidemia, unspecified: Secondary | ICD-10-CM | POA: Diagnosis not present

## 2022-05-03 DIAGNOSIS — I5032 Chronic diastolic (congestive) heart failure: Secondary | ICD-10-CM | POA: Diagnosis not present

## 2022-05-03 DIAGNOSIS — M169 Osteoarthritis of hip, unspecified: Secondary | ICD-10-CM | POA: Diagnosis not present

## 2022-05-03 DIAGNOSIS — I110 Hypertensive heart disease with heart failure: Secondary | ICD-10-CM | POA: Diagnosis not present

## 2022-05-03 DIAGNOSIS — E1140 Type 2 diabetes mellitus with diabetic neuropathy, unspecified: Secondary | ICD-10-CM | POA: Diagnosis not present

## 2022-05-03 DIAGNOSIS — F0150 Vascular dementia without behavioral disturbance: Secondary | ICD-10-CM | POA: Diagnosis not present

## 2022-05-03 DIAGNOSIS — G894 Chronic pain syndrome: Secondary | ICD-10-CM | POA: Diagnosis not present

## 2022-05-03 NOTE — Telephone Encounter (Signed)
Ok for HH as requested.

## 2022-05-03 NOTE — Telephone Encounter (Signed)
HH ORDERS  ? ?Caller Name: Erika [name] ?Home Health Agency Name: [facility] ?Callback Phone #: [phone] ?Service Requested: PT ?(examples: OT/PT/Skilled Nursing/Social Work/Speech Therapy/Wound Care) ?Frequency of Visits: 1 time a week for 8 weeks ? ? ?  ?

## 2022-05-04 ENCOUNTER — Other Ambulatory Visit: Payer: Self-pay | Admitting: Medical

## 2022-05-04 ENCOUNTER — Telehealth: Payer: Self-pay | Admitting: Family Medicine

## 2022-05-04 DIAGNOSIS — E785 Hyperlipidemia, unspecified: Secondary | ICD-10-CM | POA: Diagnosis not present

## 2022-05-04 DIAGNOSIS — I5032 Chronic diastolic (congestive) heart failure: Secondary | ICD-10-CM | POA: Diagnosis not present

## 2022-05-04 DIAGNOSIS — G894 Chronic pain syndrome: Secondary | ICD-10-CM | POA: Diagnosis not present

## 2022-05-04 DIAGNOSIS — M169 Osteoarthritis of hip, unspecified: Secondary | ICD-10-CM | POA: Diagnosis not present

## 2022-05-04 DIAGNOSIS — R634 Abnormal weight loss: Secondary | ICD-10-CM | POA: Diagnosis not present

## 2022-05-04 DIAGNOSIS — I69354 Hemiplegia and hemiparesis following cerebral infarction affecting left non-dominant side: Secondary | ICD-10-CM | POA: Diagnosis not present

## 2022-05-04 DIAGNOSIS — F331 Major depressive disorder, recurrent, moderate: Secondary | ICD-10-CM | POA: Diagnosis not present

## 2022-05-04 DIAGNOSIS — F4310 Post-traumatic stress disorder, unspecified: Secondary | ICD-10-CM | POA: Diagnosis not present

## 2022-05-04 DIAGNOSIS — F0150 Vascular dementia without behavioral disturbance: Secondary | ICD-10-CM | POA: Diagnosis not present

## 2022-05-04 DIAGNOSIS — L309 Dermatitis, unspecified: Secondary | ICD-10-CM | POA: Diagnosis not present

## 2022-05-04 DIAGNOSIS — I739 Peripheral vascular disease, unspecified: Secondary | ICD-10-CM

## 2022-05-04 DIAGNOSIS — I110 Hypertensive heart disease with heart failure: Secondary | ICD-10-CM | POA: Diagnosis not present

## 2022-05-04 DIAGNOSIS — I2510 Atherosclerotic heart disease of native coronary artery without angina pectoris: Secondary | ICD-10-CM | POA: Diagnosis not present

## 2022-05-04 DIAGNOSIS — E1140 Type 2 diabetes mellitus with diabetic neuropathy, unspecified: Secondary | ICD-10-CM | POA: Diagnosis not present

## 2022-05-04 DIAGNOSIS — R21 Rash and other nonspecific skin eruption: Secondary | ICD-10-CM | POA: Diagnosis not present

## 2022-05-04 DIAGNOSIS — E1151 Type 2 diabetes mellitus with diabetic peripheral angiopathy without gangrene: Secondary | ICD-10-CM | POA: Diagnosis not present

## 2022-05-04 DIAGNOSIS — G4700 Insomnia, unspecified: Secondary | ICD-10-CM | POA: Diagnosis not present

## 2022-05-04 NOTE — Telephone Encounter (Signed)
Spoke to stephanie with [facility] and gave verbal orders, per Dr. Einar Pheasant ?

## 2022-05-04 NOTE — Telephone Encounter (Signed)
Home Health Verbal Orders ?Caller Name: Erika [name] ?Agency Name:  [facility] ? ?Callback number: [phone] ? ?Requesting: OT ? ?Reason: Could not hear her, really muffled. ? ?Frequency: Once a week for 6 weeks ? ?Please forward to [facility] pool or providers CMA  ?

## 2022-05-04 NOTE — Telephone Encounter (Signed)
Left a message on callback # to give verbal orders. VM was not secure as far as I could tell. ?

## 2022-05-04 NOTE — Telephone Encounter (Signed)
Agree with home health as requested ?

## 2022-05-07 ENCOUNTER — Encounter: Payer: BC Managed Care – PPO | Admitting: Physical Medicine and Rehabilitation

## 2022-05-07 ENCOUNTER — Telehealth: Payer: Self-pay | Admitting: Cardiovascular Disease

## 2022-05-07 ENCOUNTER — Other Ambulatory Visit: Payer: Self-pay | Admitting: Physical Medicine and Rehabilitation

## 2022-05-07 ENCOUNTER — Other Ambulatory Visit: Payer: Self-pay | Admitting: Cardiovascular Disease

## 2022-05-07 DIAGNOSIS — G629 Polyneuropathy, unspecified: Secondary | ICD-10-CM

## 2022-05-07 NOTE — Telephone Encounter (Signed)
Pt's husband returning call regarding scheduling for test. Please advise ?

## 2022-05-07 NOTE — Telephone Encounter (Signed)
Attempted to schedule testing .  No ans no vm.  ?

## 2022-05-09 DIAGNOSIS — F331 Major depressive disorder, recurrent, moderate: Secondary | ICD-10-CM

## 2022-05-09 DIAGNOSIS — F0150 Vascular dementia without behavioral disturbance: Secondary | ICD-10-CM | POA: Diagnosis not present

## 2022-05-09 DIAGNOSIS — G894 Chronic pain syndrome: Secondary | ICD-10-CM

## 2022-05-09 DIAGNOSIS — F4310 Post-traumatic stress disorder, unspecified: Secondary | ICD-10-CM

## 2022-05-09 DIAGNOSIS — Z9181 History of falling: Secondary | ICD-10-CM

## 2022-05-09 DIAGNOSIS — I69354 Hemiplegia and hemiparesis following cerebral infarction affecting left non-dominant side: Secondary | ICD-10-CM | POA: Diagnosis not present

## 2022-05-09 DIAGNOSIS — Z951 Presence of aortocoronary bypass graft: Secondary | ICD-10-CM

## 2022-05-09 DIAGNOSIS — E785 Hyperlipidemia, unspecified: Secondary | ICD-10-CM

## 2022-05-09 DIAGNOSIS — I2510 Atherosclerotic heart disease of native coronary artery without angina pectoris: Secondary | ICD-10-CM

## 2022-05-09 DIAGNOSIS — E1151 Type 2 diabetes mellitus with diabetic peripheral angiopathy without gangrene: Secondary | ICD-10-CM

## 2022-05-09 DIAGNOSIS — R634 Abnormal weight loss: Secondary | ICD-10-CM

## 2022-05-09 DIAGNOSIS — G4700 Insomnia, unspecified: Secondary | ICD-10-CM

## 2022-05-09 DIAGNOSIS — L309 Dermatitis, unspecified: Secondary | ICD-10-CM

## 2022-05-09 DIAGNOSIS — I5032 Chronic diastolic (congestive) heart failure: Secondary | ICD-10-CM | POA: Diagnosis not present

## 2022-05-09 DIAGNOSIS — E1140 Type 2 diabetes mellitus with diabetic neuropathy, unspecified: Secondary | ICD-10-CM

## 2022-05-09 DIAGNOSIS — Z87891 Personal history of nicotine dependence: Secondary | ICD-10-CM

## 2022-05-09 DIAGNOSIS — M169 Osteoarthritis of hip, unspecified: Secondary | ICD-10-CM

## 2022-05-09 DIAGNOSIS — I110 Hypertensive heart disease with heart failure: Secondary | ICD-10-CM | POA: Diagnosis not present

## 2022-05-09 DIAGNOSIS — Z7982 Long term (current) use of aspirin: Secondary | ICD-10-CM

## 2022-05-09 DIAGNOSIS — Z794 Long term (current) use of insulin: Secondary | ICD-10-CM

## 2022-05-09 DIAGNOSIS — R21 Rash and other nonspecific skin eruption: Secondary | ICD-10-CM

## 2022-05-09 DIAGNOSIS — E559 Vitamin D deficiency, unspecified: Secondary | ICD-10-CM

## 2022-05-10 ENCOUNTER — Telehealth: Payer: Self-pay | Admitting: Family Medicine

## 2022-05-10 DIAGNOSIS — I69354 Hemiplegia and hemiparesis following cerebral infarction affecting left non-dominant side: Secondary | ICD-10-CM | POA: Diagnosis not present

## 2022-05-10 DIAGNOSIS — F4310 Post-traumatic stress disorder, unspecified: Secondary | ICD-10-CM | POA: Diagnosis not present

## 2022-05-10 DIAGNOSIS — E1151 Type 2 diabetes mellitus with diabetic peripheral angiopathy without gangrene: Secondary | ICD-10-CM | POA: Diagnosis not present

## 2022-05-10 DIAGNOSIS — G894 Chronic pain syndrome: Secondary | ICD-10-CM | POA: Diagnosis not present

## 2022-05-10 DIAGNOSIS — L309 Dermatitis, unspecified: Secondary | ICD-10-CM | POA: Diagnosis not present

## 2022-05-10 DIAGNOSIS — I2510 Atherosclerotic heart disease of native coronary artery without angina pectoris: Secondary | ICD-10-CM | POA: Diagnosis not present

## 2022-05-10 DIAGNOSIS — I110 Hypertensive heart disease with heart failure: Secondary | ICD-10-CM | POA: Diagnosis not present

## 2022-05-10 DIAGNOSIS — I5032 Chronic diastolic (congestive) heart failure: Secondary | ICD-10-CM | POA: Diagnosis not present

## 2022-05-10 DIAGNOSIS — G4700 Insomnia, unspecified: Secondary | ICD-10-CM | POA: Diagnosis not present

## 2022-05-10 DIAGNOSIS — E1140 Type 2 diabetes mellitus with diabetic neuropathy, unspecified: Secondary | ICD-10-CM | POA: Diagnosis not present

## 2022-05-10 DIAGNOSIS — F0150 Vascular dementia without behavioral disturbance: Secondary | ICD-10-CM | POA: Diagnosis not present

## 2022-05-10 DIAGNOSIS — F331 Major depressive disorder, recurrent, moderate: Secondary | ICD-10-CM | POA: Diagnosis not present

## 2022-05-10 DIAGNOSIS — M169 Osteoarthritis of hip, unspecified: Secondary | ICD-10-CM | POA: Diagnosis not present

## 2022-05-10 DIAGNOSIS — E785 Hyperlipidemia, unspecified: Secondary | ICD-10-CM | POA: Diagnosis not present

## 2022-05-10 DIAGNOSIS — R21 Rash and other nonspecific skin eruption: Secondary | ICD-10-CM | POA: Diagnosis not present

## 2022-05-10 DIAGNOSIS — R634 Abnormal weight loss: Secondary | ICD-10-CM | POA: Diagnosis not present

## 2022-05-10 NOTE — Telephone Encounter (Signed)
Horris Latino from [facility] called and wanted the nurse to know that the pt's blood pressure is 94/60 and her oxygen level is 91. She said to let the nurse know and give her a call back. Please return the call when possible.  Callback Number: [phone] Horris Latino)

## 2022-05-11 ENCOUNTER — Ambulatory Visit: Payer: Managed Care, Other (non HMO) | Admitting: Physical Medicine and Rehabilitation

## 2022-05-11 DIAGNOSIS — S8252XD Displaced fracture of medial malleolus of left tibia, subsequent encounter for closed fracture with routine healing: Secondary | ICD-10-CM | POA: Diagnosis not present

## 2022-05-11 NOTE — Telephone Encounter (Signed)
Glad to hear patient is doing well. Continue home health. Will monitor as needed and return to clinic if new or worsening symptoms

## 2022-05-11 NOTE — Telephone Encounter (Signed)
I spoke with pts husband (DPR signed) pts husband took BP last night BP 120/72 P?. BP this morning was 110/68  P ?. [name] said that pt is her usual self. Pt keeps H/A and that is not new or different. No CP, dizziness or vision changes. [name] will ck pulse ox when he goes home around noon. I tried returning Horris Latino with [facility] call at [phone] and was advised by Sharyn Lull to call a different # [phone] no answer. Sending note to Dr Einar Pheasant and Alyse Low CMA.;

## 2022-05-11 NOTE — Telephone Encounter (Signed)
Please call patient to triage.

## 2022-05-11 NOTE — Telephone Encounter (Signed)
[name] (DPR signed)notified as instructed and voiced understanding. [name] said the [facility] PT only comes out once a wk.  I left v/m for Horris Latino at [facility] to call [facility] and will speak with [facility] CMA. Sending note to [facility] until get cb from [facility].

## 2022-05-12 ENCOUNTER — Other Ambulatory Visit: Payer: Self-pay | Admitting: Physical Medicine and Rehabilitation

## 2022-05-14 DIAGNOSIS — F4310 Post-traumatic stress disorder, unspecified: Secondary | ICD-10-CM | POA: Diagnosis not present

## 2022-05-14 DIAGNOSIS — I110 Hypertensive heart disease with heart failure: Secondary | ICD-10-CM | POA: Diagnosis not present

## 2022-05-14 DIAGNOSIS — F0150 Vascular dementia without behavioral disturbance: Secondary | ICD-10-CM | POA: Diagnosis not present

## 2022-05-14 DIAGNOSIS — F331 Major depressive disorder, recurrent, moderate: Secondary | ICD-10-CM | POA: Diagnosis not present

## 2022-05-14 DIAGNOSIS — E785 Hyperlipidemia, unspecified: Secondary | ICD-10-CM | POA: Diagnosis not present

## 2022-05-14 DIAGNOSIS — E1151 Type 2 diabetes mellitus with diabetic peripheral angiopathy without gangrene: Secondary | ICD-10-CM | POA: Diagnosis not present

## 2022-05-14 DIAGNOSIS — I2510 Atherosclerotic heart disease of native coronary artery without angina pectoris: Secondary | ICD-10-CM | POA: Diagnosis not present

## 2022-05-14 DIAGNOSIS — L309 Dermatitis, unspecified: Secondary | ICD-10-CM | POA: Diagnosis not present

## 2022-05-14 DIAGNOSIS — I5032 Chronic diastolic (congestive) heart failure: Secondary | ICD-10-CM | POA: Diagnosis not present

## 2022-05-14 DIAGNOSIS — R634 Abnormal weight loss: Secondary | ICD-10-CM | POA: Diagnosis not present

## 2022-05-14 DIAGNOSIS — E1140 Type 2 diabetes mellitus with diabetic neuropathy, unspecified: Secondary | ICD-10-CM | POA: Diagnosis not present

## 2022-05-14 DIAGNOSIS — G4700 Insomnia, unspecified: Secondary | ICD-10-CM | POA: Diagnosis not present

## 2022-05-14 DIAGNOSIS — M169 Osteoarthritis of hip, unspecified: Secondary | ICD-10-CM | POA: Diagnosis not present

## 2022-05-14 DIAGNOSIS — G894 Chronic pain syndrome: Secondary | ICD-10-CM | POA: Diagnosis not present

## 2022-05-14 DIAGNOSIS — I69354 Hemiplegia and hemiparesis following cerebral infarction affecting left non-dominant side: Secondary | ICD-10-CM | POA: Diagnosis not present

## 2022-05-14 DIAGNOSIS — R21 Rash and other nonspecific skin eruption: Secondary | ICD-10-CM | POA: Diagnosis not present

## 2022-05-15 ENCOUNTER — Telehealth: Payer: Self-pay | Admitting: Cardiovascular Disease

## 2022-05-15 ENCOUNTER — Other Ambulatory Visit: Payer: Self-pay | Admitting: Physical Medicine and Rehabilitation

## 2022-05-15 DIAGNOSIS — G894 Chronic pain syndrome: Secondary | ICD-10-CM | POA: Diagnosis not present

## 2022-05-15 DIAGNOSIS — G4700 Insomnia, unspecified: Secondary | ICD-10-CM | POA: Diagnosis not present

## 2022-05-15 DIAGNOSIS — R634 Abnormal weight loss: Secondary | ICD-10-CM | POA: Diagnosis not present

## 2022-05-15 DIAGNOSIS — E785 Hyperlipidemia, unspecified: Secondary | ICD-10-CM | POA: Diagnosis not present

## 2022-05-15 DIAGNOSIS — E1151 Type 2 diabetes mellitus with diabetic peripheral angiopathy without gangrene: Secondary | ICD-10-CM | POA: Diagnosis not present

## 2022-05-15 DIAGNOSIS — M169 Osteoarthritis of hip, unspecified: Secondary | ICD-10-CM | POA: Diagnosis not present

## 2022-05-15 DIAGNOSIS — I110 Hypertensive heart disease with heart failure: Secondary | ICD-10-CM | POA: Diagnosis not present

## 2022-05-15 DIAGNOSIS — F331 Major depressive disorder, recurrent, moderate: Secondary | ICD-10-CM | POA: Diagnosis not present

## 2022-05-15 DIAGNOSIS — F0150 Vascular dementia without behavioral disturbance: Secondary | ICD-10-CM | POA: Diagnosis not present

## 2022-05-15 DIAGNOSIS — L309 Dermatitis, unspecified: Secondary | ICD-10-CM | POA: Diagnosis not present

## 2022-05-15 DIAGNOSIS — F4310 Post-traumatic stress disorder, unspecified: Secondary | ICD-10-CM | POA: Diagnosis not present

## 2022-05-15 DIAGNOSIS — I5032 Chronic diastolic (congestive) heart failure: Secondary | ICD-10-CM | POA: Diagnosis not present

## 2022-05-15 DIAGNOSIS — I69354 Hemiplegia and hemiparesis following cerebral infarction affecting left non-dominant side: Secondary | ICD-10-CM | POA: Diagnosis not present

## 2022-05-15 DIAGNOSIS — I2510 Atherosclerotic heart disease of native coronary artery without angina pectoris: Secondary | ICD-10-CM | POA: Diagnosis not present

## 2022-05-15 DIAGNOSIS — R21 Rash and other nonspecific skin eruption: Secondary | ICD-10-CM | POA: Diagnosis not present

## 2022-05-15 DIAGNOSIS — E1140 Type 2 diabetes mellitus with diabetic neuropathy, unspecified: Secondary | ICD-10-CM | POA: Diagnosis not present

## 2022-05-15 NOTE — Telephone Encounter (Signed)
Pt BP readings seem similar to pt in office readings except 05/10/22. Will route to Dr. Fletcher Anon to advise.

## 2022-05-15 NOTE — Telephone Encounter (Signed)
Pt c/o BP issue: STAT if pt c/o blurred vision, one-sided weakness or slurred speech  1. What are your last 5 BP readings?  05/03/22 104/60 0512/23 105/68 05/'18/23 98/60 94/60 '$ 05/14/22 122/92   2. Are you having any other symptoms (ex. Dizziness, headache, blurred vision, passed out)? Dizziness and very weak on Thursday 05/10/22  3. What is your BP issue? Hypotension   Horris Latino is requesting both she and the patient be called regarding this.

## 2022-05-16 NOTE — Telephone Encounter (Signed)
Decrease Imdur to 30 mg once daily.

## 2022-05-17 ENCOUNTER — Encounter: Payer: Self-pay | Admitting: Family Medicine

## 2022-05-17 MED ORDER — ISOSORBIDE MONONITRATE ER 30 MG PO TB24: 30.0000 mg | ORAL_TABLET | Freq: Every day | ORAL | 1 refills | Status: DC

## 2022-05-17 NOTE — Telephone Encounter (Addendum)
DPR on file. Spoke with the pt husband.  Patients husband made aware of Dr. Tyrell Antonio response and recommendation. Rx for Imdur 30 mg daily sent to the patients pharmacy.   Adv the pt husband to continue to monitor the patients BP. They are to contact the office if the patients BP continues to run low or if the pt has any break through angina with the lowered dose of Imdur. Pt husband verbalized understanding.  Called the patients physical therapist Horris Latino and lmtcb.

## 2022-05-22 DIAGNOSIS — F331 Major depressive disorder, recurrent, moderate: Secondary | ICD-10-CM | POA: Diagnosis not present

## 2022-05-22 DIAGNOSIS — I69354 Hemiplegia and hemiparesis following cerebral infarction affecting left non-dominant side: Secondary | ICD-10-CM | POA: Diagnosis not present

## 2022-05-22 DIAGNOSIS — I110 Hypertensive heart disease with heart failure: Secondary | ICD-10-CM | POA: Diagnosis not present

## 2022-05-22 DIAGNOSIS — R21 Rash and other nonspecific skin eruption: Secondary | ICD-10-CM | POA: Diagnosis not present

## 2022-05-22 DIAGNOSIS — F4310 Post-traumatic stress disorder, unspecified: Secondary | ICD-10-CM | POA: Diagnosis not present

## 2022-05-22 DIAGNOSIS — L309 Dermatitis, unspecified: Secondary | ICD-10-CM | POA: Diagnosis not present

## 2022-05-22 DIAGNOSIS — I2510 Atherosclerotic heart disease of native coronary artery without angina pectoris: Secondary | ICD-10-CM | POA: Diagnosis not present

## 2022-05-22 DIAGNOSIS — G4700 Insomnia, unspecified: Secondary | ICD-10-CM | POA: Diagnosis not present

## 2022-05-22 DIAGNOSIS — F0150 Vascular dementia without behavioral disturbance: Secondary | ICD-10-CM | POA: Diagnosis not present

## 2022-05-22 DIAGNOSIS — E1151 Type 2 diabetes mellitus with diabetic peripheral angiopathy without gangrene: Secondary | ICD-10-CM | POA: Diagnosis not present

## 2022-05-22 DIAGNOSIS — E785 Hyperlipidemia, unspecified: Secondary | ICD-10-CM | POA: Diagnosis not present

## 2022-05-22 DIAGNOSIS — I5032 Chronic diastolic (congestive) heart failure: Secondary | ICD-10-CM | POA: Diagnosis not present

## 2022-05-22 DIAGNOSIS — G894 Chronic pain syndrome: Secondary | ICD-10-CM | POA: Diagnosis not present

## 2022-05-22 DIAGNOSIS — R634 Abnormal weight loss: Secondary | ICD-10-CM | POA: Diagnosis not present

## 2022-05-22 DIAGNOSIS — M169 Osteoarthritis of hip, unspecified: Secondary | ICD-10-CM | POA: Diagnosis not present

## 2022-05-22 DIAGNOSIS — E1140 Type 2 diabetes mellitus with diabetic neuropathy, unspecified: Secondary | ICD-10-CM | POA: Diagnosis not present

## 2022-05-23 ENCOUNTER — Ambulatory Visit: Payer: Medicare Other | Admitting: Neurology

## 2022-05-23 DIAGNOSIS — R634 Abnormal weight loss: Secondary | ICD-10-CM | POA: Diagnosis not present

## 2022-05-23 DIAGNOSIS — E1140 Type 2 diabetes mellitus with diabetic neuropathy, unspecified: Secondary | ICD-10-CM | POA: Diagnosis not present

## 2022-05-23 DIAGNOSIS — I69354 Hemiplegia and hemiparesis following cerebral infarction affecting left non-dominant side: Secondary | ICD-10-CM | POA: Diagnosis not present

## 2022-05-23 DIAGNOSIS — F331 Major depressive disorder, recurrent, moderate: Secondary | ICD-10-CM | POA: Diagnosis not present

## 2022-05-23 DIAGNOSIS — E785 Hyperlipidemia, unspecified: Secondary | ICD-10-CM | POA: Diagnosis not present

## 2022-05-23 DIAGNOSIS — I110 Hypertensive heart disease with heart failure: Secondary | ICD-10-CM | POA: Diagnosis not present

## 2022-05-23 DIAGNOSIS — I5032 Chronic diastolic (congestive) heart failure: Secondary | ICD-10-CM | POA: Diagnosis not present

## 2022-05-23 DIAGNOSIS — M169 Osteoarthritis of hip, unspecified: Secondary | ICD-10-CM | POA: Diagnosis not present

## 2022-05-23 DIAGNOSIS — G894 Chronic pain syndrome: Secondary | ICD-10-CM | POA: Diagnosis not present

## 2022-05-23 DIAGNOSIS — F0150 Vascular dementia without behavioral disturbance: Secondary | ICD-10-CM | POA: Diagnosis not present

## 2022-05-23 DIAGNOSIS — L309 Dermatitis, unspecified: Secondary | ICD-10-CM | POA: Diagnosis not present

## 2022-05-23 DIAGNOSIS — I2510 Atherosclerotic heart disease of native coronary artery without angina pectoris: Secondary | ICD-10-CM | POA: Diagnosis not present

## 2022-05-23 DIAGNOSIS — F4310 Post-traumatic stress disorder, unspecified: Secondary | ICD-10-CM | POA: Diagnosis not present

## 2022-05-23 DIAGNOSIS — E1151 Type 2 diabetes mellitus with diabetic peripheral angiopathy without gangrene: Secondary | ICD-10-CM | POA: Diagnosis not present

## 2022-05-23 DIAGNOSIS — G4700 Insomnia, unspecified: Secondary | ICD-10-CM | POA: Diagnosis not present

## 2022-05-23 DIAGNOSIS — R21 Rash and other nonspecific skin eruption: Secondary | ICD-10-CM | POA: Diagnosis not present

## 2022-05-25 ENCOUNTER — Encounter: Payer: Self-pay | Admitting: Family Medicine

## 2022-05-25 ENCOUNTER — Ambulatory Visit (INDEPENDENT_AMBULATORY_CARE_PROVIDER_SITE_OTHER): Payer: BC Managed Care – PPO

## 2022-05-25 ENCOUNTER — Ambulatory Visit (INDEPENDENT_AMBULATORY_CARE_PROVIDER_SITE_OTHER): Payer: BC Managed Care – PPO | Admitting: Family Medicine

## 2022-05-25 VITALS — BP 130/80 | HR 88 | Temp 97.5°F | Ht 67.0 in | Wt 226.2 lb

## 2022-05-25 DIAGNOSIS — I779 Disorder of arteries and arterioles, unspecified: Secondary | ICD-10-CM

## 2022-05-25 DIAGNOSIS — E785 Hyperlipidemia, unspecified: Secondary | ICD-10-CM

## 2022-05-25 DIAGNOSIS — R0902 Hypoxemia: Secondary | ICD-10-CM

## 2022-05-25 DIAGNOSIS — Z87891 Personal history of nicotine dependence: Secondary | ICD-10-CM | POA: Diagnosis not present

## 2022-05-25 DIAGNOSIS — R0683 Snoring: Secondary | ICD-10-CM | POA: Diagnosis not present

## 2022-05-25 DIAGNOSIS — I5032 Chronic diastolic (congestive) heart failure: Secondary | ICD-10-CM

## 2022-05-25 DIAGNOSIS — Z8673 Personal history of transient ischemic attack (TIA), and cerebral infarction without residual deficits: Secondary | ICD-10-CM | POA: Diagnosis not present

## 2022-05-25 LAB — HEPATIC FUNCTION PANEL
ALT: 30 U/L (ref 0–35)
AST: 24 U/L (ref 0–37)
Albumin: 4.3 g/dL (ref 3.5–5.2)
Alkaline Phosphatase: 71 U/L (ref 39–117)
Bilirubin, Direct: 0.1 mg/dL (ref 0.0–0.3)
Total Bilirubin: 0.4 mg/dL (ref 0.2–1.2)
Total Protein: 7.7 g/dL (ref 6.0–8.3)

## 2022-05-25 LAB — LIPID PANEL
Cholesterol: 93 mg/dL (ref 0–200)
HDL: 41.8 mg/dL (ref >39.00)
LDL Cholesterol: 15 mg/dL (ref 0–99)
NonHDL: 51.35
Total CHOL/HDL Ratio: 2
Triglycerides: 182 mg/dL — ABNORMAL HIGH (ref 0.0–149.0)
VLDL: 36.4 mg/dL (ref 0.0–40.0)

## 2022-05-25 NOTE — Assessment & Plan Note (Signed)
No signs of volume overload on exam. Low suspicion this is contributing to her hypoxia.

## 2022-05-25 NOTE — Assessment & Plan Note (Signed)
Pt with recent episode of covid, complex cardiac history, snoring, recent stroke, and tobacco use history. No prior diagnosis of COPD. She been having intermittent episodes of SOB - or as she also describes periods of apnea while awake followed but multiple deep breaths which are associated with hypoxia - pulse ox <90% on RA. She recovers quickly with several deep breaths. Central apnea may be a possibility but it is also possible she has underlying COPD and daytime hypoxia is concerning for possibility for sleep apnea and nighttime hypoxia. Will refer to pulmonology for evaluation.

## 2022-05-25 NOTE — Assessment & Plan Note (Signed)
Follows with cardiology. Cont crestor 20 mg. Recheck labs and will forward to cardiology.

## 2022-05-25 NOTE — Assessment & Plan Note (Signed)
She notes snoring and her husband has to wake her to breath occasionally. Referral for OSA eval.

## 2022-05-25 NOTE — Patient Instructions (Signed)
Mychart or call with the name of the pulmonology group.   Referral  - sleep study and lung testing  Update if worsening breathing Go to the hospital if less than 90, short of breath and not recovering

## 2022-05-25 NOTE — Progress Notes (Signed)
Subjective:     Erika [name] is a 51 y.o. female presenting for low O2 (In the 59's )     HPI  #Low O2 - oxygen does not say above 94% - will occasionally drop below 90% - will occur at rest - feels like she forgets to breath when awake and will take a deep breath - daughter and husband - notice she will have increased work of breathing and look SOB at rest - will walk around the house - down the hall - gets SOB with this - will also have worsening PAD  Does have albuterol Hx of smoker - quit 1 year ago  Does snore at night Does stop breathing  Review of Systems   Social History   Tobacco Use  Smoking Status Former   Packs/day: 0.50   Years: 27.00   Pack years: 13.50   Types: Cigarettes  Smokeless Tobacco Never        Objective:    BP Readings from Last 3 Encounters:  05/25/22 130/80  04/19/22 106/64  03/19/22 102/60   Wt Readings from Last 3 Encounters:  05/25/22 226 lb 4 oz (102.6 kg)  04/19/22 223 lb 4 oz (101.3 kg)  03/19/22 216 lb 4 oz (98.1 kg)    BP 130/80   Pulse 88   Temp (!) 97.5 F (36.4 C) (Temporal)   Ht '5\' 7"'$  (1.702 m)   Wt 226 lb 4 oz (102.6 kg)   SpO2 94%   BMI 35.44 kg/m    Physical Exam Constitutional:      General: She is not in acute distress.    Appearance: She is well-developed. She is not diaphoretic.  HENT:     Right Ear: External ear normal.     Left Ear: External ear normal.     Nose: Nose normal.  Eyes:     Conjunctiva/sclera: Conjunctivae normal.  Cardiovascular:     Rate and Rhythm: Normal rate and regular rhythm.     Heart sounds: Murmur heard.  Pulmonary:     Effort: Pulmonary effort is normal. No respiratory distress.     Breath sounds: Normal breath sounds. No wheezing or rales.  Musculoskeletal:     Cervical back: Neck supple.  Skin:    General: Skin is warm and dry.     Capillary Refill: Capillary refill takes less than 2 seconds.  Neurological:     Mental Status: She is alert. Mental  status is at baseline.  Psychiatric:        Mood and Affect: Mood normal.        Behavior: Behavior normal.          Assessment & Plan:   Problem List Items Addressed This Visit       Respiratory   Hypoxia - Primary    Pt with recent episode of covid, complex cardiac history, snoring, recent stroke, and tobacco use history. No prior diagnosis of COPD. She been having intermittent episodes of SOB - or as she also describes periods of apnea while awake followed but multiple deep breaths which are associated with hypoxia - pulse ox <90% on RA. She recovers quickly with several deep breaths. Central apnea may be a possibility but it is also possible she has underlying COPD and daytime hypoxia is concerning for possibility for sleep apnea and nighttime hypoxia. Will refer to pulmonology for evaluation.          Other   HLD (hyperlipidemia)    Follows with cardiology.  Cont crestor 20 mg. Recheck labs and will forward to cardiology.         Relevant Orders   Lipid panel   Hepatic Function Panel   Snoring    She notes snoring and her husband has to wake her to breath occasionally. Referral for OSA eval.        History of tobacco abuse     Return if symptoms worsen or fail to improve.  Lesleigh Noe, MD

## 2022-05-28 ENCOUNTER — Other Ambulatory Visit: Payer: Self-pay | Admitting: Family Medicine

## 2022-05-28 DIAGNOSIS — I2510 Atherosclerotic heart disease of native coronary artery without angina pectoris: Secondary | ICD-10-CM | POA: Diagnosis not present

## 2022-05-28 DIAGNOSIS — F4310 Post-traumatic stress disorder, unspecified: Secondary | ICD-10-CM | POA: Diagnosis not present

## 2022-05-28 DIAGNOSIS — R21 Rash and other nonspecific skin eruption: Secondary | ICD-10-CM | POA: Diagnosis not present

## 2022-05-28 DIAGNOSIS — F0150 Vascular dementia without behavioral disturbance: Secondary | ICD-10-CM | POA: Diagnosis not present

## 2022-05-28 DIAGNOSIS — R0683 Snoring: Secondary | ICD-10-CM

## 2022-05-28 DIAGNOSIS — E785 Hyperlipidemia, unspecified: Secondary | ICD-10-CM | POA: Diagnosis not present

## 2022-05-28 DIAGNOSIS — L309 Dermatitis, unspecified: Secondary | ICD-10-CM | POA: Diagnosis not present

## 2022-05-28 DIAGNOSIS — I5032 Chronic diastolic (congestive) heart failure: Secondary | ICD-10-CM | POA: Diagnosis not present

## 2022-05-28 DIAGNOSIS — F331 Major depressive disorder, recurrent, moderate: Secondary | ICD-10-CM | POA: Diagnosis not present

## 2022-05-28 DIAGNOSIS — G4700 Insomnia, unspecified: Secondary | ICD-10-CM | POA: Diagnosis not present

## 2022-05-28 DIAGNOSIS — R0902 Hypoxemia: Secondary | ICD-10-CM

## 2022-05-28 DIAGNOSIS — R634 Abnormal weight loss: Secondary | ICD-10-CM | POA: Diagnosis not present

## 2022-05-28 DIAGNOSIS — M169 Osteoarthritis of hip, unspecified: Secondary | ICD-10-CM | POA: Diagnosis not present

## 2022-05-28 DIAGNOSIS — E1140 Type 2 diabetes mellitus with diabetic neuropathy, unspecified: Secondary | ICD-10-CM | POA: Diagnosis not present

## 2022-05-28 DIAGNOSIS — G894 Chronic pain syndrome: Secondary | ICD-10-CM | POA: Diagnosis not present

## 2022-05-28 DIAGNOSIS — I110 Hypertensive heart disease with heart failure: Secondary | ICD-10-CM | POA: Diagnosis not present

## 2022-05-28 DIAGNOSIS — I69354 Hemiplegia and hemiparesis following cerebral infarction affecting left non-dominant side: Secondary | ICD-10-CM | POA: Diagnosis not present

## 2022-05-28 DIAGNOSIS — E1151 Type 2 diabetes mellitus with diabetic peripheral angiopathy without gangrene: Secondary | ICD-10-CM | POA: Diagnosis not present

## 2022-05-29 ENCOUNTER — Other Ambulatory Visit (HOSPITAL_COMMUNITY): Payer: Self-pay

## 2022-05-29 ENCOUNTER — Ambulatory Visit (INDEPENDENT_AMBULATORY_CARE_PROVIDER_SITE_OTHER): Payer: BC Managed Care – PPO | Admitting: Pulmonary Disease

## 2022-05-29 ENCOUNTER — Encounter: Payer: Self-pay | Admitting: Pulmonary Disease

## 2022-05-29 VITALS — BP 126/74 | HR 87 | Temp 97.7°F | Ht 67.0 in | Wt 265.0 lb

## 2022-05-29 DIAGNOSIS — F331 Major depressive disorder, recurrent, moderate: Secondary | ICD-10-CM | POA: Diagnosis not present

## 2022-05-29 DIAGNOSIS — I6350 Cerebral infarction due to unspecified occlusion or stenosis of unspecified cerebral artery: Secondary | ICD-10-CM | POA: Diagnosis not present

## 2022-05-29 DIAGNOSIS — G4730 Sleep apnea, unspecified: Secondary | ICD-10-CM

## 2022-05-29 DIAGNOSIS — I5032 Chronic diastolic (congestive) heart failure: Secondary | ICD-10-CM | POA: Diagnosis not present

## 2022-05-29 DIAGNOSIS — E785 Hyperlipidemia, unspecified: Secondary | ICD-10-CM | POA: Diagnosis not present

## 2022-05-29 DIAGNOSIS — E1140 Type 2 diabetes mellitus with diabetic neuropathy, unspecified: Secondary | ICD-10-CM | POA: Diagnosis not present

## 2022-05-29 DIAGNOSIS — I2510 Atherosclerotic heart disease of native coronary artery without angina pectoris: Secondary | ICD-10-CM | POA: Diagnosis not present

## 2022-05-29 DIAGNOSIS — I110 Hypertensive heart disease with heart failure: Secondary | ICD-10-CM | POA: Diagnosis not present

## 2022-05-29 DIAGNOSIS — G4700 Insomnia, unspecified: Secondary | ICD-10-CM | POA: Diagnosis not present

## 2022-05-29 DIAGNOSIS — L309 Dermatitis, unspecified: Secondary | ICD-10-CM | POA: Diagnosis not present

## 2022-05-29 DIAGNOSIS — M169 Osteoarthritis of hip, unspecified: Secondary | ICD-10-CM | POA: Diagnosis not present

## 2022-05-29 DIAGNOSIS — F0150 Vascular dementia without behavioral disturbance: Secondary | ICD-10-CM | POA: Diagnosis not present

## 2022-05-29 DIAGNOSIS — I69354 Hemiplegia and hemiparesis following cerebral infarction affecting left non-dominant side: Secondary | ICD-10-CM | POA: Diagnosis not present

## 2022-05-29 DIAGNOSIS — R634 Abnormal weight loss: Secondary | ICD-10-CM | POA: Diagnosis not present

## 2022-05-29 DIAGNOSIS — E1151 Type 2 diabetes mellitus with diabetic peripheral angiopathy without gangrene: Secondary | ICD-10-CM | POA: Diagnosis not present

## 2022-05-29 DIAGNOSIS — G894 Chronic pain syndrome: Secondary | ICD-10-CM | POA: Diagnosis not present

## 2022-05-29 DIAGNOSIS — F4310 Post-traumatic stress disorder, unspecified: Secondary | ICD-10-CM | POA: Diagnosis not present

## 2022-05-29 DIAGNOSIS — R21 Rash and other nonspecific skin eruption: Secondary | ICD-10-CM | POA: Diagnosis not present

## 2022-05-29 NOTE — Patient Instructions (Signed)
I suspect that you are going to have issues with complex sleep apnea this includes lament of perhaps obstructive i.e. your airway closes and an element of your brain not giving appropriate signal to the lungs to breathe.  We are going to schedule you for a split-night sleep study this will be done in lab and we will try to get this expedited.  I am going to assign follow-up with one of our sleep disorder experts as he will be able to determine best therapy for you with regards to these issues.  The sleep specialist is Dr. Halford Chessman and we will arrange the follow-up for after you have your sleep study.

## 2022-05-29 NOTE — Progress Notes (Signed)
Subjective:    Patient ID: Erika [name], female    DOB: 1971-09-09, 51 y.o.   MRN: [id number] Patient Care Team: Lesleigh Noe, MD as PCP - General (Family Medicine) Wellington Hampshire, MD as PCP - Cardiology (Cardiology)  Chief Complaint  Patient presents with   PULMONARY CONSULT    SOB at night.   HPI Patient is a 51 year old former smoker (quit 2022, 13.5 PY) with a history as noted below, who presents for evaluation of "shallow breathing".  Patient is kindly referred by Dr. Waunita Schooner.  The patient states that she noticed initial issues after a right pontine stroke in July of last year but issues have gotten worse over the last few months.  She notes that particularly in the evenings and just prior to falling asleep she is aware that she is not taking deep breaths or that she "forgets to breathe".  She has nocturnal awakenings which she cannot explain what are they triggered by.  She is also noted to have significant snoring.  She states that her husband notes that she has significant apneic episodes and sometimes has to put his hand on her chest to make sure she is breathing while she is sleeping.  She has a significant history of cerebrovascular disease and has had at least 4 strokes the most recent 2 being in the pontine area.  She is also on multiple sedating medications.  She has a residual left hemiparesis from prior stroke.  She has not noted any fevers, chills or sweats.  No chest pain.  No wheezing.  No shortness of breath per se.  No cough sputum production or hemoptysis.  Not endorse any other symptomatology.  Her symptoms are not improved by anything except being cognizant that she needs to take a deep breath and then taking a deep breath at that time.   Review of Systems A 10 point review of systems was performed and it is as noted above otherwise negative.  Past Medical History:  Diagnosis Date   Acute respiratory failure with hypoxia ([place]) 06/24/2021   Arthralgia  of temporomandibular joint    CAD, multiple vessel    a. 06/2016 Cath: ostLM 40%, ostLAD 40%, pLAD 95%, ost-pLCx 60%, pLCx 95%, mLCx 60%, mRCA 95%, D2 50%, LVSF nl;  b. 07/2016 CABG x 4 (LIMA->LAD, VG->Diag, VG->OM, VG->RCA); c. 08/2016 Cath: 3VD w/ 4/4 patent grafts. LAD distal to LIMA has diff dzs->Med rx; d. 08/2020 Cath: 4/4 patent grafts, native 3VD. EF 55-65%-->Med Rx.   Carotid arterial disease ([place])    a. 07/2016 s/p R CEA; b. 02/2021 U/S: RICA 25-42%, LICA 7-06%.   Clotting disorder ([place])    Depression    Diastolic dysfunction    a. 06/2016 Echo: EF 50-55%, mild inf wall HK, GR1DD, mild MR, RV sys fxn nl, mildly dilated LA, PASP nl; b. 06/2021 Echo: EF 60-65%, no rwma. Nl RV fxn.   Fatty liver disease, nonalcoholic 2376   History of blood transfusion    with heart surgery   HLD (hyperlipidemia)    Labile hypertension    a. prior renal ngiogram negative for RAS in 03/2016; b. catecholamines and metanephrines normal, mildly elevated renin with normal aldosterone and normal ratio in 02/2016   Myocardial infarction [facility]) 2017   Obesity    PAD (peripheral artery disease) ([place])    a. 09/2018 s/p L SFA stenting; b. 07/2019 Periph Angio: Patent m/d L SFA stent w/ 100% L SFA distal to stent. L AT 100d,  L Peroneal diff dzs-->Med Rx; c. 02/2021 ABIs: stable @ 0.61 on R and 0.46 on L.   PTSD (post-traumatic stress disorder)    Tobacco abuse    Type 2 diabetes mellitus ([place]) 12/2015   Past Surgical History:  Procedure Laterality Date   ABDOMINAL AORTOGRAM W/LOWER EXTREMITY [name] 10/15/2018   Procedure: ABDOMINAL AORTOGRAM W/LOWER EXTREMITY;  Surgeon: Wellington Hampshire, MD;  Location: [place] [facility];  Service: Cardiovascular;  Laterality: [name];   ABDOMINAL AORTOGRAM W/LOWER EXTREMITY Bilateral 08/19/2019   Procedure: ABDOMINAL AORTOGRAM W/LOWER EXTREMITY;  Surgeon: Wellington Hampshire, MD;  Location: [place] [facility];  Service: Cardiovascular;  Laterality: Bilateral;   CARDIAC CATHETERIZATION [name]  06/29/2016   Procedure: Left Heart Cath and Coronary Angiography;  Surgeon: Minna Merritts, MD;  Location: [place] [facility];  Service: Cardiovascular;  Laterality: [name];   CARDIAC CATHETERIZATION [name] 08/29/2016   Procedure: Left Heart Cath and Cors/Grafts Angiography;  Surgeon: Wellington Hampshire, MD;  Location: [place] [facility];  Service: Cardiovascular;  Laterality: [name];   CESAREAN SECTION     CHOLECYSTECTOMY     CORONARY ARTERY BYPASS GRAFT [name] 07/06/2016   Procedure: CORONARY ARTERY BYPASS GRAFTING (CABG) x four, using left internal mammary artery and right leg greater saphenous vein harvested endoscopically;  Surgeon: Ivin Poot, MD;  Location: [place];  Service: Open Heart Surgery;  Laterality: [name];   ENDARTERECTOMY Right 07/06/2016   Procedure: ENDARTERECTOMY CAROTID;  Surgeon: Rosetta Posner, MD;  Location: [facility] OR;  Service: Vascular;  Laterality: Right;   ENDARTERECTOMY Right 04/27/2020   Procedure: REDO OF RIGHT ENDARTERECTOMY CAROTID;  Surgeon: Rosetta Posner, MD;  Location: [facility];  Service: Vascular;  Laterality: Right;   LEFT HEART CATH AND CORS/GRAFTS ANGIOGRAPHY [name] 08/24/2020   Procedure: LEFT HEART CATH AND CORS/GRAFTS ANGIOGRAPHY;  Surgeon: Wellington Hampshire, MD;  Location: [place] [facility];  Service: Cardiovascular;  Laterality: [name];   LEFT HEART CATH AND CORS/GRAFTS ANGIOGRAPHY [name] 01/29/2022   Procedure: LEFT HEART CATH AND CORS/GRAFTS ANGIOGRAPHY;  Surgeon: Wellington Hampshire, MD;  Location: [place] [facility];  Service: Cardiovascular;  Laterality: [name];   PERIPHERAL VASCULAR CATHETERIZATION [name] 04/18/2016   Procedure: Renal Angiography;  Surgeon: Wellington Hampshire, MD;  Location: [place] [facility];  Service: Cardiovascular;  Laterality: [name];   PERIPHERAL VASCULAR INTERVENTION Left 10/15/2018   Procedure: PERIPHERAL VASCULAR INTERVENTION;  Surgeon: Wellington Hampshire, MD;  Location: [place] [facility];  Service: Cardiovascular;  Laterality: Left;  Left superficial femoral   TEE  WITHOUT CARDIOVERSION [name] 07/06/2016   Procedure: TRANSESOPHAGEAL ECHOCARDIOGRAM (TEE);  Surgeon: Ivin Poot, MD;  Location: [place];  Service: Open Heart Surgery;  Laterality: [name];   TONSILLECTOMY     Patient Active Problem List   Diagnosis Date Noted   Hypoxia 05/25/2022   Snoring 05/25/2022   History of tobacco abuse 05/25/2022   Dermatitis 03/19/2022   Mild vascular dementia without behavioral disturbance, psychotic disturbance, mood disturbance, or anxiety ([place]) 74/16/3845   Acute metabolic encephalopathy/delirium/polypharmacy 01/31/2022   Demand ischemia ([place])    Pressure injury of skin 01/27/2022   Cardiomegaly    Polypharmacy    COVID-19 virus infection 12/22/2021   Hyperglycemia 12/22/2021   Major depressive disorder, recurrent episode, mild ([place]) 11/26/2021   Ankle fracture, left    Closed avulsion fracture of medial malleolus of left tibia    Cerebrovascular accident (CVA) due to occlusion of vertebral artery ([place]) 09/11/2021   Cognitive change 09/11/2021  Cognitive communication deficit 08/10/2021   Urinary incontinence 08/10/2021   GERD (gastroesophageal reflux disease) 08/03/2021   Chronic post-traumatic stress disorder (PTSD)    Depression    Right pontine stroke ([place]) 06/28/2021   Hemiplegia and hemiparesis following cerebral infarction affecting left non-dominant side ([place]) 06/24/2021   Chronic diastolic CHF (congestive heart failure) ([place]) 06/24/2021   Fall 06/24/2021   Abnormal LFTs 06/24/2021   Olecranon bursitis of left elbow 06/12/2021   Chronic hip pain (Bilateral) 06/09/2021   Chronic use of opiate for therapeutic purpose 05/09/2021   Greater trochanteric bursitis of hip (Left) 03/09/2021   Bursitis of hip (Left) 72/08/4708   Uncomplicated opioid dependence ([place]) 02/20/2021   Acute conjunctivitis of left eye 01/02/2021   Unintentional weight loss 11/21/2020   Broken teeth (Right) 08/02/2020   History of MI (myocardial infarction) (July 2017)  08/02/2020   Neuropathy 06/09/2020   Dental abscess 06/09/2020   Foot drop (Left) 06/09/2020   Carotid stenosis, asymptomatic, right 04/27/2020   Chronic migraine 11/03/2019   Thrombocytosis 09/16/2019   Erythrocytosis 09/16/2019   Hypercalcemia 09/06/2019   Leukocytosis 09/06/2019   Polycythemia 09/06/2019   Chronic hip pain (Left) 08/27/2019   Osteoarthritis of hip (Left) 08/27/2019   Gluteal tendonitis of buttock (Left) 08/27/2019   Radial nerve palsy (Right) 07/15/2019   Neuropathy of radial nerve (Right) 06/30/2019   Abnormal bruising 06/25/2019   History of carotid endarterectomy (Right) 03/04/2019   Pain medication agreement signed 03/04/2019   Atypical facial pain (Right) 01/27/2019   Chronic ear pain (Right) 01/27/2019   Chronic jaw pain (Right) 01/27/2019   Geniculate Neuralgia (Right) 01/27/2019   Vitamin D deficiency 01/19/2019   Neurogenic pain 01/19/2019   Chronic anticoagulation (PLAVIX) 01/19/2019   Chronic pain syndrome 01/07/2019   Long term current use of opiate analgesic 01/07/2019   Long term prescription benzodiazepine use 01/07/2019   Pharmacologic therapy 01/07/2019   Disorder of skeletal system 01/07/2019   Problems influencing health status 01/07/2019   Chronic headaches (1ry area of Pain) (Right) 01/07/2019   Opiate use 09/24/2018   PVD (peripheral vascular disease) ([place]) 06/24/2018   Chronic ankle pain (Bilateral) 12/27/2017   Tendinopathy of gluteus medius (Right) 12/27/2017   Tendinopathy of gluteus medius (Left) 12/27/2017   Bilateral hip pain 12/26/2017   Chronic elbow pain (Left) 10/17/2017   Elevated troponin I level 05/21/2017   Major depressive disorder, recurrent episode, moderate (HCC) 11/19/2016   Insomnia 10/30/2016   Constipation 07/25/2016   S/P CABG x 4 07/06/2016   Bradycardia    CAD (coronary artery disease)    Carotid stenosis    CAD in native artery 06/29/2016   Elevated troponin 06/28/2016   Essential hypertension,  malignant 06/28/2016   Mild tobacco abuse in early remission 06/28/2016   Essential hypertension    Malignant hypertension    Type 2 diabetes mellitus with other specified complication (HCC)    Chest pain with high risk for cardiac etiology 06/27/2016   NSTEMI (non-ST elevated myocardial infarction) ([place]) 06/27/2016   Proteinuria 03/15/2016   Renal artery stenosis ([place]) 03/15/2016   MDD (major depressive disorder) 10/17/2015   Agoraphobia with panic attacks 04/25/2015   HTN (hypertension), malignant 10/20/2013   Debility 04/02/2013   Cluster headache 03/20/2012   HLD (hyperlipidemia) 07/26/2010   Family History  Adopted: Yes  Problem Relation Age of Onset   Diabetes Mother    Diabetes Father    Alcohol abuse Father    Heart disease Father    Drug abuse Father  Stroke Sister    Anxiety disorder Sister    Social History   Tobacco Use   Smoking status: Former    Packs/day: 0.50    Years: 27.00    Pack years: 13.50    Types: Cigarettes    Quit date: 2022    Years since quitting: 1.4   Smokeless tobacco: Never  Substance Use Topics   Alcohol use: Not Currently    Alcohol/week: 0.0 standard drinks    Comment: socially   Allergies  Allergen Reactions   Chantix [Varenicline Tartrate] Other (See Comments)    Feels "crazy" and angry    Current Meds  Medication Sig   acetaminophen (TYLENOL) 325 MG tablet Take 1-2 tablets (325-650 mg total) by mouth every 4 (four) hours as needed for mild pain.   albuterol (VENTOLIN HFA) 108 (90 Base) MCG/ACT inhaler Inhale 2 puffs into the lungs every 6 (six) hours as needed for wheezing.   ascorbic acid (VITAMIN C) 500 MG tablet Take 1 tablet (500 mg total) by mouth daily.   aspirin 81 MG chewable tablet Chew 1 tablet (81 mg total) by mouth daily with breakfast.   butalbital-acetaminophen-caffeine (FIORICET) 50-325-40 MG tablet TAKE ONE TABLET BY MOUTH ONE TIME DAILY AS NEEDED FOR HEADACHE   carvedilol (COREG) 6.25 MG tablet Take 1  tablet (6.25 mg total) by mouth 2 (two) times daily.   chlorproMAZINE (THORAZINE) 25 MG tablet Take 1 tablet (25 mg total) by mouth at bedtime.   clonazePAM (KLONOPIN) 0.25 MG disintegrating tablet Take 1 tablet (0.25 mg total) by mouth 2 (two) times daily.   clopidogrel (PLAVIX) 75 MG tablet Take 1 tablet (75 mg total) by mouth daily.   Continuous Blood Gluc Sensor (DEXCOM G6 SENSOR) MISC 1 Piece by Does not apply route as needed.   Continuous Blood Gluc Transmit (DEXCOM G6 TRANSMITTER) MISC 1 Piece by Does not apply route as needed.   cyclobenzaprine (FLEXERIL) 10 MG tablet TAKE ONE TABLET BY MOUTH AT BEDTIME   donepezil (ARICEPT) 5 MG tablet Take 1 tablet (5 mg total) by mouth at bedtime.   Dulaglutide (TRULICITY) 6.94 WN/4.6EV SOPN Inject 0.75 mg into the skin once a week.   escitalopram (LEXAPRO) 20 MG tablet Take 1 tablet (20 mg total) by mouth daily.   Evolocumab (REPATHA SURECLICK) 035 MG/ML SOAJ Inject 1 Dose into the skin every 14 (fourteen) days.   ezetimibe (ZETIA) 10 MG tablet Take 1 tablet (10 mg total) by mouth daily.   icosapent Ethyl (VASCEPA) 1 g capsule Take 2 g by mouth 2 (two) times daily.   insulin detemir (LEVEMIR) 100 UNIT/ML injection Inject 27 Units into the skin daily.   Insulin lispro (HUMALOG JUNIOR KWIKPEN) 100 UNIT/ML Inject 0-10 Units into the skin 3 (three) times daily. insulin Lispro (Humalog) injection 0-10 Units 0-10 Units Subcutaneous, 3 times daily with meals CBG < 70: Implement Hypoglycemia Standing Orders and refer to Hypoglycemia Standing Orders sidebar report  CBG 70 - 120: 0 unit CBG 121 - 150: 0 unit  CBG 151 - 200: 1 unit CBG 201 - 250: 2 units CBG 251 - 300: 4 units CBG 301 - 350: 6 units  CBG 351 - 400: 8 units  CBG > 400: 10 units   isosorbide mononitrate (IMDUR) 30 MG 24 hr tablet Take 1 tablet (30 mg total) by mouth daily.   lamoTRIgine (LAMICTAL) 200 MG tablet Take 1 tablet (200 mg total) by mouth at bedtime.   nitroGLYCERIN (NITROSTAT) 0.4 MG SL  tablet Place  1 tablet (0.4 mg total) under the tongue every 5 (five) minutes as needed for chest pain.   pantoprazole (PROTONIX) 40 MG tablet TAKE ONE TABLET BY MOUTH ONE TIME DAILY   potassium chloride (KLOR-CON) 20 MEQ packet MIX AND TAKE 1 PACKET (20MEQ) BY MOUTH DAILY AS DIRECTED   pregabalin (LYRICA) 225 MG capsule TAKE ONE CAPSULE BY MOUTH TWICE A DAY   rosuvastatin (CRESTOR) 20 MG tablet TAKE ONE TABLET BY MOUTH ONE TIME DAILY   senna-docusate (SENOKOT-S) 8.6-50 MG tablet Take 2 tablets by mouth at bedtime.   triamcinolone cream (KENALOG) 0.1 % Apply 1 application. topically 2 (two) times daily.   ULTICARE MINI PEN NEEDLES 31G X 6 MM MISC USE PEN NEEDLES TWO TIMES DAILY   vitamin B-12 1000 MCG tablet Take 1 tablet (1,000 mcg total) by mouth daily.   Vitamin D, Ergocalciferol, (DRISDOL) 1.25 MG (50000 UNIT) CAPS capsule TAKE ONE CAPSULE BY MOUTH EVERY WEEK   [DISCONTINUED] pregabalin (LYRICA) 200 MG capsule Take 1 capsule (200 mg total) by mouth 2 (two) times daily.   Immunization History  Administered Date(s) Administered   Influenza Split 09/29/2012   Influenza,inj,Quad PF,6+ Mos 10/17/2015, 09/03/2016, 09/17/2017, 09/24/2018, 09/07/2019, 03/01/2022   Pneumococcal Polysaccharide-23 12/26/2017   Td 07/26/2010   Tdap 03/01/2022       Objective:   Physical Exam BP 126/74 (BP Location: Right Arm, Cuff Size: Normal)   Pulse 87   Temp 97.7 F (36.5 C) (Temporal)   Ht '5\' 7"'$  (1.702 m)   Wt 265 lb (120.2 kg)   SpO2 90%   BMI 41.50 kg/m  GENERAL: Obese woman, awake and alert, presents in wheelchair.  Left hemiparesis noted.  No conversational dyspnea. HEAD: Normocephalic, atraumatic.  EYES: Pupils equal, round, reactive to light.  No scleral icterus.  MOUTH: Class III airway.  Oral mucosa moist NECK: Supple. No thyromegaly. Trachea midline. No JVD.  No adenopathy. PULMONARY: Good air entry bilaterally.  No adventitious sounds. CARDIOVASCULAR: S1 and S2. Regular rate and rhythm.   No rubs, murmurs or gallops heard. ABDOMEN: Obese, otherwise benign. MUSCULOSKELETAL: No joint deformity, no clubbing, no edema.  NEUROLOGIC: Left hemiparesis, no dysarthria or aphasia.  Awake and alert, mild memory impairment. SKIN: Intact,warm,dry. PSYCH: Mood and behavior normal.     05/29/2022   10:00 AM  Results of the Epworth flowsheet  Sitting and reading 2  Watching TV 3  Sitting, inactive in a public place (e.g. a theatre or a meeting) 0  As a passenger in a car for an hour without a break 3  Lying down to rest in the afternoon when circumstances permit 2  Sitting and talking to someone 0  Sitting quietly after a lunch without alcohol 0  In a car, while stopped for a few minutes in traffic 0  Total score 10        Assessment & Plan:     ICD-10-CM   1. Sleep-disordered breathing  G47.30 Split night study   And labs split-night study Suspect she will have complex sleep apnea Suspect central/obstructive events Poststroke sleep disordered breathing    2. Right pontine stroke (HCC)  I63.50     3. Hemiplegia and hemiparesis following cerebral infarction affecting left non-dominant side [facility])  I69.354      Orders Placed This Encounter  Procedures   Split night study    Standing Status:   Future    Standing Expiration Date:   05/30/2023    Scheduling Instructions:     URGENT  Order Specific Question:   Where should this test be performed:    Answer:   [place]   Patient is likely experiencing poststroke sleep disordered breathing and central apnea.  Call that the patient's strokes occur in the pontine area and this area does contain the the breathing center.  I suspect that she likely has complex sleep apnea including both central and obstructive events.  She will need an in lab sleep study to better assess this.  We will proceed to obtain a split-night study.  We will also set her up with our sleep specialist and expert Dr. Chesley Mires.  The patient will be seen in  follow-up after the sleep study is performed.  She is to contact the clinic prior to that time should any new difficulties arise.  Renold Don, MD Advanced Bronchoscopy PCCM [facility]    *This note was dictated using voice recognition software/Dragon.  Despite best efforts to proofread, errors can occur which can change the meaning. Any transcriptional errors that result from this process are unintentional and may not be fully corrected at the time of dictation.

## 2022-06-04 ENCOUNTER — Other Ambulatory Visit: Payer: Self-pay | Admitting: *Deleted

## 2022-06-04 DIAGNOSIS — I639 Cerebral infarction, unspecified: Secondary | ICD-10-CM

## 2022-06-04 DIAGNOSIS — I779 Disorder of arteries and arterioles, unspecified: Secondary | ICD-10-CM

## 2022-06-04 DIAGNOSIS — I25118 Atherosclerotic heart disease of native coronary artery with other forms of angina pectoris: Secondary | ICD-10-CM

## 2022-06-06 ENCOUNTER — Other Ambulatory Visit: Payer: Self-pay | Admitting: Physical Medicine and Rehabilitation

## 2022-06-07 ENCOUNTER — Telehealth: Payer: Self-pay

## 2022-06-07 DIAGNOSIS — E785 Hyperlipidemia, unspecified: Secondary | ICD-10-CM | POA: Diagnosis not present

## 2022-06-07 DIAGNOSIS — E1151 Type 2 diabetes mellitus with diabetic peripheral angiopathy without gangrene: Secondary | ICD-10-CM | POA: Diagnosis not present

## 2022-06-07 DIAGNOSIS — F4310 Post-traumatic stress disorder, unspecified: Secondary | ICD-10-CM | POA: Diagnosis not present

## 2022-06-07 DIAGNOSIS — L309 Dermatitis, unspecified: Secondary | ICD-10-CM | POA: Diagnosis not present

## 2022-06-07 DIAGNOSIS — F0150 Vascular dementia without behavioral disturbance: Secondary | ICD-10-CM | POA: Diagnosis not present

## 2022-06-07 DIAGNOSIS — M169 Osteoarthritis of hip, unspecified: Secondary | ICD-10-CM | POA: Diagnosis not present

## 2022-06-07 DIAGNOSIS — E1140 Type 2 diabetes mellitus with diabetic neuropathy, unspecified: Secondary | ICD-10-CM | POA: Diagnosis not present

## 2022-06-07 DIAGNOSIS — G894 Chronic pain syndrome: Secondary | ICD-10-CM | POA: Diagnosis not present

## 2022-06-07 DIAGNOSIS — R634 Abnormal weight loss: Secondary | ICD-10-CM | POA: Diagnosis not present

## 2022-06-07 DIAGNOSIS — I69354 Hemiplegia and hemiparesis following cerebral infarction affecting left non-dominant side: Secondary | ICD-10-CM | POA: Diagnosis not present

## 2022-06-07 DIAGNOSIS — I110 Hypertensive heart disease with heart failure: Secondary | ICD-10-CM | POA: Diagnosis not present

## 2022-06-07 DIAGNOSIS — I5032 Chronic diastolic (congestive) heart failure: Secondary | ICD-10-CM | POA: Diagnosis not present

## 2022-06-07 DIAGNOSIS — F331 Major depressive disorder, recurrent, moderate: Secondary | ICD-10-CM | POA: Diagnosis not present

## 2022-06-07 DIAGNOSIS — I2510 Atherosclerotic heart disease of native coronary artery without angina pectoris: Secondary | ICD-10-CM | POA: Diagnosis not present

## 2022-06-07 DIAGNOSIS — R21 Rash and other nonspecific skin eruption: Secondary | ICD-10-CM | POA: Diagnosis not present

## 2022-06-07 DIAGNOSIS — G4700 Insomnia, unspecified: Secondary | ICD-10-CM | POA: Diagnosis not present

## 2022-06-07 NOTE — Telephone Encounter (Signed)
Home Health verbal orders  Agency: [facility]   Requesting OT  Frequency: Once a week for 2 weeks   Please forward to [facility] pool or providers CMA

## 2022-06-08 NOTE — Telephone Encounter (Signed)
Agree with HH orders as requested 

## 2022-06-08 NOTE — Telephone Encounter (Signed)
Relayed verbal orders for OT once a week for 2 weeks per Dr. Einar Pheasant.

## 2022-06-11 ENCOUNTER — Ambulatory Visit (INDEPENDENT_AMBULATORY_CARE_PROVIDER_SITE_OTHER): Payer: BC Managed Care – PPO | Admitting: Family Medicine

## 2022-06-11 VITALS — BP 100/60 | HR 100 | Temp 97.2°F | Wt 229.0 lb

## 2022-06-11 DIAGNOSIS — S8252XD Displaced fracture of medial malleolus of left tibia, subsequent encounter for closed fracture with routine healing: Secondary | ICD-10-CM | POA: Diagnosis not present

## 2022-06-11 DIAGNOSIS — G44029 Chronic cluster headache, not intractable: Secondary | ICD-10-CM

## 2022-06-11 DIAGNOSIS — L309 Dermatitis, unspecified: Secondary | ICD-10-CM

## 2022-06-11 DIAGNOSIS — L409 Psoriasis, unspecified: Secondary | ICD-10-CM | POA: Diagnosis not present

## 2022-06-11 DIAGNOSIS — M533 Sacrococcygeal disorders, not elsewhere classified: Secondary | ICD-10-CM

## 2022-06-11 MED ORDER — OXYCODONE HCL 10 MG PO TABS: 10.0000 mg | ORAL_TABLET | Freq: Every day | ORAL | 0 refills | Status: DC | PRN

## 2022-06-11 MED ORDER — TRIAMCINOLONE ACETONIDE 0.1 % EX CREA: 1.0000 | TOPICAL_CREAM | Freq: Two times a day (BID) | CUTANEOUS | 0 refills | Status: DC

## 2022-06-11 MED ORDER — CLOBETASOL PROPIONATE 0.05 % EX SOLN: 1.0000 | Freq: Two times a day (BID) | CUTANEOUS | 0 refills | Status: DC

## 2022-06-11 NOTE — Progress Notes (Signed)
Subjective:     Erika [name] is a 51 y.o. female presenting for Skin Problem (Dry and itchy around hairline ) and Headache (Cluster headaches over x 3 days )     HPI  #Cluster HA - oxycodone is the only thing that works - usually only seems to respond to Oxycodone 10 mg - 4 year history - associated with runny nose, watery eyes, ear pain - eyes watery - did improve initially after the stroke - oxycodone dulls it down to a 4 from a 7-8 -   #Scalp rash - dry and itch at the hairline - cannot put a cream in the hair -   #Coccyx pain - not bad when laying back  Review of Systems   Social History   Tobacco Use  Smoking Status Former   Packs/day: 0.50   Years: 27.00   Total pack years: 13.50   Types: Cigarettes   Quit date: 2022   Years since quitting: 1.4  Smokeless Tobacco Never        Objective:    BP Readings from Last 3 Encounters:  06/11/22 100/60  05/29/22 126/74  05/25/22 130/80   Wt Readings from Last 3 Encounters:  06/11/22 229 lb (103.9 kg)  05/29/22 265 lb (120.2 kg)  05/25/22 226 lb 4 oz (102.6 kg)    BP 100/60   Pulse 100   Temp (!) 97.2 F (36.2 C) (Temporal)   Wt 229 lb (103.9 kg)   LMP 04/23/2022 (Exact Date)   SpO2 93%   BMI 35.87 kg/m    Physical Exam Constitutional:      General: She is not in acute distress.    Appearance: She is well-developed. She is not diaphoretic.  HENT:     Right Ear: External ear normal.     Left Ear: External ear normal.     Nose: Nose normal.  Eyes:     Conjunctiva/sclera: Conjunctivae normal.  Cardiovascular:     Rate and Rhythm: Normal rate.  Pulmonary:     Effort: Pulmonary effort is normal.  Musculoskeletal:     Cervical back: Neck supple.  Skin:    General: Skin is warm and dry.     Capillary Refill: Capillary refill takes less than 2 seconds.     Comments: Erythematous rash with white plaques along the scalp line and in the hairline.   Neurological:     Mental Status:  She is alert. Mental status is at baseline.  Psychiatric:        Mood and Affect: Mood normal.        Behavior: Behavior normal.           Assessment & Plan:   Problem List Items Addressed This Visit       Nervous and Auditory   Cluster headache (Chronic)    Persistent symptoms with lyrica. Notes previously responded to oxycodone 10 mg.  Discussed if headaches persist and become daily that she get in with pain doctor with neurology.  She notes history of neurology not being helpful, she will contact her pain doctor if symptoms persist.      Relevant Medications   Oxycodone HCl 10 MG TABS     Musculoskeletal and Integument   Dermatitis   Relevant Medications   triamcinolone cream (KENALOG) 0.1 %   Scalp psoriasis - Primary    Rash along the hairline and behind the ears seems consistent with psoriasis.  Clobetasol shampoo sent to pharmacy.  Update if no improvement.  Relevant Medications   triamcinolone cream (KENALOG) 0.1 %   clobetasol (TEMOVATE) 0.05 % external solution     Other   Coccyx pain    Patient is wheelchair-bound, suspect this is positional.  She follows with physical therapy, advise discussing with them to see if there is any treatment that could be done.  She denies any skin breakdown.  Exam deferred.      Relevant Medications   Oxycodone HCl 10 MG TABS     Return if symptoms worsen or fail to improve.  Lesleigh Noe, MD

## 2022-06-11 NOTE — Assessment & Plan Note (Signed)
Persistent symptoms with lyrica. Notes previously responded to oxycodone 10 mg.  Discussed if headaches persist and become daily that she get in with pain doctor with neurology.  She notes history of neurology not being helpful, she will contact her pain doctor if symptoms persist.

## 2022-06-11 NOTE — Assessment & Plan Note (Signed)
Patient is wheelchair-bound, suspect this is positional.  She follows with physical therapy, advise discussing with them to see if there is any treatment that could be done.  She denies any skin breakdown.  Exam deferred.

## 2022-06-11 NOTE — Patient Instructions (Signed)
#  Scalp Psoriasis - on the skin use triamcinolone - I will send in a solution for your hair  #Headaches - Oxycodone as needed - but if unable to break pattern - return to pain medicine

## 2022-06-11 NOTE — Assessment & Plan Note (Signed)
Rash along the hairline and behind the ears seems consistent with psoriasis.  Clobetasol shampoo sent to pharmacy.  Update if no improvement.

## 2022-06-12 DIAGNOSIS — L309 Dermatitis, unspecified: Secondary | ICD-10-CM | POA: Diagnosis not present

## 2022-06-12 DIAGNOSIS — F4310 Post-traumatic stress disorder, unspecified: Secondary | ICD-10-CM | POA: Diagnosis not present

## 2022-06-12 DIAGNOSIS — M169 Osteoarthritis of hip, unspecified: Secondary | ICD-10-CM | POA: Diagnosis not present

## 2022-06-12 DIAGNOSIS — I69354 Hemiplegia and hemiparesis following cerebral infarction affecting left non-dominant side: Secondary | ICD-10-CM | POA: Diagnosis not present

## 2022-06-12 DIAGNOSIS — R21 Rash and other nonspecific skin eruption: Secondary | ICD-10-CM | POA: Diagnosis not present

## 2022-06-12 DIAGNOSIS — G4700 Insomnia, unspecified: Secondary | ICD-10-CM | POA: Diagnosis not present

## 2022-06-12 DIAGNOSIS — F331 Major depressive disorder, recurrent, moderate: Secondary | ICD-10-CM | POA: Diagnosis not present

## 2022-06-12 DIAGNOSIS — I2510 Atherosclerotic heart disease of native coronary artery without angina pectoris: Secondary | ICD-10-CM | POA: Diagnosis not present

## 2022-06-12 DIAGNOSIS — E1140 Type 2 diabetes mellitus with diabetic neuropathy, unspecified: Secondary | ICD-10-CM | POA: Diagnosis not present

## 2022-06-12 DIAGNOSIS — I110 Hypertensive heart disease with heart failure: Secondary | ICD-10-CM | POA: Diagnosis not present

## 2022-06-12 DIAGNOSIS — I5032 Chronic diastolic (congestive) heart failure: Secondary | ICD-10-CM | POA: Diagnosis not present

## 2022-06-12 DIAGNOSIS — E1151 Type 2 diabetes mellitus with diabetic peripheral angiopathy without gangrene: Secondary | ICD-10-CM | POA: Diagnosis not present

## 2022-06-12 DIAGNOSIS — G894 Chronic pain syndrome: Secondary | ICD-10-CM | POA: Diagnosis not present

## 2022-06-12 DIAGNOSIS — F0150 Vascular dementia without behavioral disturbance: Secondary | ICD-10-CM | POA: Diagnosis not present

## 2022-06-12 DIAGNOSIS — R634 Abnormal weight loss: Secondary | ICD-10-CM | POA: Diagnosis not present

## 2022-06-12 DIAGNOSIS — E785 Hyperlipidemia, unspecified: Secondary | ICD-10-CM | POA: Diagnosis not present

## 2022-06-13 DIAGNOSIS — I5032 Chronic diastolic (congestive) heart failure: Secondary | ICD-10-CM | POA: Diagnosis not present

## 2022-06-13 DIAGNOSIS — G894 Chronic pain syndrome: Secondary | ICD-10-CM | POA: Diagnosis not present

## 2022-06-13 DIAGNOSIS — G4700 Insomnia, unspecified: Secondary | ICD-10-CM | POA: Diagnosis not present

## 2022-06-13 DIAGNOSIS — R634 Abnormal weight loss: Secondary | ICD-10-CM | POA: Diagnosis not present

## 2022-06-13 DIAGNOSIS — F331 Major depressive disorder, recurrent, moderate: Secondary | ICD-10-CM | POA: Diagnosis not present

## 2022-06-13 DIAGNOSIS — L309 Dermatitis, unspecified: Secondary | ICD-10-CM | POA: Diagnosis not present

## 2022-06-13 DIAGNOSIS — E1151 Type 2 diabetes mellitus with diabetic peripheral angiopathy without gangrene: Secondary | ICD-10-CM | POA: Diagnosis not present

## 2022-06-13 DIAGNOSIS — I69354 Hemiplegia and hemiparesis following cerebral infarction affecting left non-dominant side: Secondary | ICD-10-CM | POA: Diagnosis not present

## 2022-06-13 DIAGNOSIS — I110 Hypertensive heart disease with heart failure: Secondary | ICD-10-CM | POA: Diagnosis not present

## 2022-06-13 DIAGNOSIS — F0150 Vascular dementia without behavioral disturbance: Secondary | ICD-10-CM | POA: Diagnosis not present

## 2022-06-13 DIAGNOSIS — F4310 Post-traumatic stress disorder, unspecified: Secondary | ICD-10-CM | POA: Diagnosis not present

## 2022-06-13 DIAGNOSIS — E785 Hyperlipidemia, unspecified: Secondary | ICD-10-CM | POA: Diagnosis not present

## 2022-06-13 DIAGNOSIS — R21 Rash and other nonspecific skin eruption: Secondary | ICD-10-CM | POA: Diagnosis not present

## 2022-06-13 DIAGNOSIS — M169 Osteoarthritis of hip, unspecified: Secondary | ICD-10-CM | POA: Diagnosis not present

## 2022-06-13 DIAGNOSIS — I2510 Atherosclerotic heart disease of native coronary artery without angina pectoris: Secondary | ICD-10-CM | POA: Diagnosis not present

## 2022-06-13 DIAGNOSIS — E1140 Type 2 diabetes mellitus with diabetic neuropathy, unspecified: Secondary | ICD-10-CM | POA: Diagnosis not present

## 2022-06-16 ENCOUNTER — Other Ambulatory Visit: Payer: Self-pay | Admitting: Physical Medicine and Rehabilitation

## 2022-06-18 DIAGNOSIS — M169 Osteoarthritis of hip, unspecified: Secondary | ICD-10-CM | POA: Diagnosis not present

## 2022-06-18 DIAGNOSIS — E1140 Type 2 diabetes mellitus with diabetic neuropathy, unspecified: Secondary | ICD-10-CM | POA: Diagnosis not present

## 2022-06-18 DIAGNOSIS — R21 Rash and other nonspecific skin eruption: Secondary | ICD-10-CM | POA: Diagnosis not present

## 2022-06-18 DIAGNOSIS — I69354 Hemiplegia and hemiparesis following cerebral infarction affecting left non-dominant side: Secondary | ICD-10-CM | POA: Diagnosis not present

## 2022-06-18 DIAGNOSIS — G894 Chronic pain syndrome: Secondary | ICD-10-CM | POA: Diagnosis not present

## 2022-06-18 DIAGNOSIS — F0150 Vascular dementia without behavioral disturbance: Secondary | ICD-10-CM | POA: Diagnosis not present

## 2022-06-18 DIAGNOSIS — G4700 Insomnia, unspecified: Secondary | ICD-10-CM | POA: Diagnosis not present

## 2022-06-18 DIAGNOSIS — L309 Dermatitis, unspecified: Secondary | ICD-10-CM | POA: Diagnosis not present

## 2022-06-18 DIAGNOSIS — F331 Major depressive disorder, recurrent, moderate: Secondary | ICD-10-CM | POA: Diagnosis not present

## 2022-06-18 DIAGNOSIS — F4310 Post-traumatic stress disorder, unspecified: Secondary | ICD-10-CM | POA: Diagnosis not present

## 2022-06-18 DIAGNOSIS — I5032 Chronic diastolic (congestive) heart failure: Secondary | ICD-10-CM | POA: Diagnosis not present

## 2022-06-18 DIAGNOSIS — E1151 Type 2 diabetes mellitus with diabetic peripheral angiopathy without gangrene: Secondary | ICD-10-CM | POA: Diagnosis not present

## 2022-06-18 DIAGNOSIS — I110 Hypertensive heart disease with heart failure: Secondary | ICD-10-CM | POA: Diagnosis not present

## 2022-06-18 DIAGNOSIS — I2510 Atherosclerotic heart disease of native coronary artery without angina pectoris: Secondary | ICD-10-CM | POA: Diagnosis not present

## 2022-06-18 DIAGNOSIS — E785 Hyperlipidemia, unspecified: Secondary | ICD-10-CM | POA: Diagnosis not present

## 2022-06-18 DIAGNOSIS — R634 Abnormal weight loss: Secondary | ICD-10-CM | POA: Diagnosis not present

## 2022-07-02 NOTE — Telephone Encounter (Signed)
Per Dominica Severin technician defer vascular testing until 2024

## 2022-07-06 ENCOUNTER — Other Ambulatory Visit: Payer: Self-pay | Admitting: Physical Medicine and Rehabilitation

## 2022-07-06 ENCOUNTER — Other Ambulatory Visit (HOSPITAL_COMMUNITY): Payer: Self-pay | Admitting: Psychiatry

## 2022-07-06 DIAGNOSIS — F331 Major depressive disorder, recurrent, moderate: Secondary | ICD-10-CM

## 2022-07-06 DIAGNOSIS — F4310 Post-traumatic stress disorder, unspecified: Secondary | ICD-10-CM

## 2022-07-08 ENCOUNTER — Other Ambulatory Visit (HOSPITAL_COMMUNITY): Payer: Self-pay | Admitting: Psychiatry

## 2022-07-08 ENCOUNTER — Other Ambulatory Visit: Payer: Self-pay | Admitting: Physical Medicine and Rehabilitation

## 2022-07-08 DIAGNOSIS — F4310 Post-traumatic stress disorder, unspecified: Secondary | ICD-10-CM

## 2022-07-08 DIAGNOSIS — G629 Polyneuropathy, unspecified: Secondary | ICD-10-CM

## 2022-07-08 DIAGNOSIS — F331 Major depressive disorder, recurrent, moderate: Secondary | ICD-10-CM

## 2022-07-09 ENCOUNTER — Other Ambulatory Visit: Payer: Self-pay

## 2022-07-09 ENCOUNTER — Ambulatory Visit: Payer: BC Managed Care – PPO | Attending: Pulmonary Disease

## 2022-07-09 ENCOUNTER — Other Ambulatory Visit: Payer: Self-pay | Admitting: Physical Medicine and Rehabilitation

## 2022-07-09 DIAGNOSIS — E7849 Other hyperlipidemia: Secondary | ICD-10-CM

## 2022-07-09 DIAGNOSIS — G43701 Chronic migraine without aura, not intractable, with status migrainosus: Secondary | ICD-10-CM

## 2022-07-09 DIAGNOSIS — I509 Heart failure, unspecified: Secondary | ICD-10-CM | POA: Diagnosis not present

## 2022-07-09 DIAGNOSIS — Z6841 Body Mass Index (BMI) 40.0 and over, adult: Secondary | ICD-10-CM | POA: Diagnosis not present

## 2022-07-09 DIAGNOSIS — G4733 Obstructive sleep apnea (adult) (pediatric): Secondary | ICD-10-CM | POA: Insufficient documentation

## 2022-07-09 MED ORDER — EZETIMIBE 10 MG PO TABS: 10.0000 mg | ORAL_TABLET | Freq: Every day | ORAL | 0 refills | Status: DC

## 2022-07-10 ENCOUNTER — Telehealth (INDEPENDENT_AMBULATORY_CARE_PROVIDER_SITE_OTHER): Payer: BC Managed Care – PPO | Admitting: Pulmonary Disease

## 2022-07-10 DIAGNOSIS — G4733 Obstructive sleep apnea (adult) (pediatric): Secondary | ICD-10-CM

## 2022-07-10 MED ORDER — BUTALBITAL-APAP-CAFFEINE 50-325-40 MG PO TABS: 1.0000 | ORAL_TABLET | Freq: Four times a day (QID) | ORAL | 0 refills | Status: DC | PRN

## 2022-07-10 NOTE — Telephone Encounter (Signed)
Will await Dr. Domingo Dimes response. She is unavailable until 07/16/2022

## 2022-07-10 NOTE — Telephone Encounter (Signed)
Mild OSA AHI 12/h Few centrals 4/h, not significant Consider CPAP or dental appliance

## 2022-07-11 DIAGNOSIS — S8252XD Displaced fracture of medial malleolus of left tibia, subsequent encounter for closed fracture with routine healing: Secondary | ICD-10-CM | POA: Diagnosis not present

## 2022-07-16 ENCOUNTER — Encounter (HOSPITAL_COMMUNITY): Payer: Self-pay | Admitting: Psychiatry

## 2022-07-16 ENCOUNTER — Telehealth (HOSPITAL_BASED_OUTPATIENT_CLINIC_OR_DEPARTMENT_OTHER): Payer: BC Managed Care – PPO | Admitting: Psychiatry

## 2022-07-16 DIAGNOSIS — F331 Major depressive disorder, recurrent, moderate: Secondary | ICD-10-CM

## 2022-07-16 DIAGNOSIS — F4310 Post-traumatic stress disorder, unspecified: Secondary | ICD-10-CM

## 2022-07-16 DIAGNOSIS — G4733 Obstructive sleep apnea (adult) (pediatric): Secondary | ICD-10-CM

## 2022-07-16 MED ORDER — ESCITALOPRAM OXALATE 20 MG PO TABS: 20.0000 mg | ORAL_TABLET | Freq: Every day | ORAL | 2 refills | Status: DC

## 2022-07-16 MED ORDER — CHLORPROMAZINE HCL 50 MG PO TABS: 50.0000 mg | ORAL_TABLET | Freq: Every day | ORAL | 2 refills | Status: DC

## 2022-07-16 MED ORDER — CLONAZEPAM 0.25 MG PO TBDP: 0.2500 mg | ORAL_TABLET | Freq: Two times a day (BID) | ORAL | 2 refills | Status: DC

## 2022-07-16 MED ORDER — LAMOTRIGINE 200 MG PO TABS: 200.0000 mg | ORAL_TABLET | Freq: Every day | ORAL | 2 refills | Status: DC

## 2022-07-16 NOTE — Telephone Encounter (Signed)
Spoke to patient's spouse, pearse(DPR) is aware of results and voiced his understanding.  I suggested that he relay results to patient then determine if patient would like to proceed with titration. He stated to go ahead and proceed with titration.  Order has been placed. Nothing further further.

## 2022-07-16 NOTE — Telephone Encounter (Signed)
Results have been printed and given to Dr. Patsey Berthold for review.
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
## 2022-07-16 NOTE — Telephone Encounter (Signed)
She has obstructive sleep apnea but there is also a element of central apneas because of this recommendation is for a formal titration study in lab to make sure we are not making the central apneas worse with the CPAP.

## 2022-07-21 ENCOUNTER — Other Ambulatory Visit: Payer: Self-pay | Admitting: Physical Medicine and Rehabilitation

## 2022-07-23 DIAGNOSIS — M9903 Segmental and somatic dysfunction of lumbar region: Secondary | ICD-10-CM | POA: Diagnosis not present

## 2022-07-23 DIAGNOSIS — M62830 Muscle spasm of back: Secondary | ICD-10-CM | POA: Diagnosis not present

## 2022-07-23 DIAGNOSIS — M9901 Segmental and somatic dysfunction of cervical region: Secondary | ICD-10-CM | POA: Diagnosis not present

## 2022-07-23 DIAGNOSIS — M542 Cervicalgia: Secondary | ICD-10-CM | POA: Diagnosis not present

## 2022-07-31 DIAGNOSIS — M62830 Muscle spasm of back: Secondary | ICD-10-CM | POA: Diagnosis not present

## 2022-07-31 DIAGNOSIS — M9901 Segmental and somatic dysfunction of cervical region: Secondary | ICD-10-CM | POA: Diagnosis not present

## 2022-07-31 DIAGNOSIS — M542 Cervicalgia: Secondary | ICD-10-CM | POA: Diagnosis not present

## 2022-07-31 DIAGNOSIS — M9903 Segmental and somatic dysfunction of lumbar region: Secondary | ICD-10-CM | POA: Diagnosis not present

## 2022-08-05 ENCOUNTER — Other Ambulatory Visit (HOSPITAL_COMMUNITY): Payer: Self-pay | Admitting: Psychiatry

## 2022-08-05 ENCOUNTER — Other Ambulatory Visit: Payer: Self-pay | Admitting: Physical Medicine and Rehabilitation

## 2022-08-05 DIAGNOSIS — F331 Major depressive disorder, recurrent, moderate: Secondary | ICD-10-CM

## 2022-08-05 DIAGNOSIS — G629 Polyneuropathy, unspecified: Secondary | ICD-10-CM

## 2022-08-06 ENCOUNTER — Other Ambulatory Visit: Payer: Self-pay

## 2022-08-07 ENCOUNTER — Telehealth: Payer: Self-pay

## 2022-08-07 DIAGNOSIS — M9903 Segmental and somatic dysfunction of lumbar region: Secondary | ICD-10-CM | POA: Diagnosis not present

## 2022-08-07 DIAGNOSIS — M9901 Segmental and somatic dysfunction of cervical region: Secondary | ICD-10-CM | POA: Diagnosis not present

## 2022-08-07 DIAGNOSIS — M542 Cervicalgia: Secondary | ICD-10-CM | POA: Diagnosis not present

## 2022-08-07 DIAGNOSIS — M62830 Muscle spasm of back: Secondary | ICD-10-CM | POA: Diagnosis not present

## 2022-08-07 NOTE — Telephone Encounter (Signed)
Refill request for Potassium CL 20 meq. Is this okay to refill?

## 2022-08-08 ENCOUNTER — Telehealth: Payer: Self-pay

## 2022-08-08 NOTE — Telephone Encounter (Signed)
Will send

## 2022-08-08 NOTE — Telephone Encounter (Signed)
Called in Potassium chloride per Dr. Ranell Patrick.

## 2022-08-09 ENCOUNTER — Encounter: Payer: Self-pay | Admitting: Physical Medicine and Rehabilitation

## 2022-08-09 ENCOUNTER — Encounter
Payer: BC Managed Care – PPO | Attending: Physical Medicine and Rehabilitation | Admitting: Physical Medicine and Rehabilitation

## 2022-08-09 VITALS — BP 121/75 | HR 71 | Ht 67.0 in

## 2022-08-09 DIAGNOSIS — G44029 Chronic cluster headache, not intractable: Secondary | ICD-10-CM | POA: Insufficient documentation

## 2022-08-09 DIAGNOSIS — Z79899 Other long term (current) drug therapy: Secondary | ICD-10-CM | POA: Diagnosis not present

## 2022-08-09 DIAGNOSIS — G629 Polyneuropathy, unspecified: Secondary | ICD-10-CM | POA: Insufficient documentation

## 2022-08-09 DIAGNOSIS — G4700 Insomnia, unspecified: Secondary | ICD-10-CM | POA: Insufficient documentation

## 2022-08-09 DIAGNOSIS — G894 Chronic pain syndrome: Secondary | ICD-10-CM | POA: Diagnosis not present

## 2022-08-09 DIAGNOSIS — L309 Dermatitis, unspecified: Secondary | ICD-10-CM | POA: Insufficient documentation

## 2022-08-09 DIAGNOSIS — Z5181 Encounter for therapeutic drug level monitoring: Secondary | ICD-10-CM | POA: Insufficient documentation

## 2022-08-09 DIAGNOSIS — I63411 Cerebral infarction due to embolism of right middle cerebral artery: Secondary | ICD-10-CM | POA: Diagnosis not present

## 2022-08-09 DIAGNOSIS — I739 Peripheral vascular disease, unspecified: Secondary | ICD-10-CM | POA: Insufficient documentation

## 2022-08-09 MED ORDER — AMITRIPTYLINE HCL 10 MG PO TABS: 10.0000 mg | ORAL_TABLET | Freq: Every day | ORAL | 1 refills | Status: DC

## 2022-08-09 NOTE — Progress Notes (Signed)
Subjective:    Patient ID: Erika [name], female    DOB: 18-Nov-1971, 51 y.o.   MRN: [id number]  HPI Erika [name] is a 50 year old woman who presents for f/u of CVA and headaches.   1) CVA -She has been doing well at home, but at times feels she is a burden to her husband and family despite his reassurance that she is not -walking from bedroom and bathroom every day  -she walked up to 150 feet yesterday with therapy! -spouse has been able to work at home since this happened.   2) Type 2 DM: -previously sent scripts for for DexCom receiver, sensory, and transmitter- her daughter has this so she is familiar with its use -Her CBGs have recently been between 120 and 130  3) Migraines -migraines continue to be severe, present every day, despite use of oxycodone and Lyrica. -she has had benefit from Fioricet, though less than with oxycodone, but she also is less confused than with the oxycodone so her husband prefers the Fioricet to the oxycodone. -she would like to restart oxycodone  -she would like to take this at night to help her sleep.  -she prefers oxycodone to Percocet  -she is willing to try Botox  4) Urinary incontinence -having about one episode of urinary incontinence daily, usually at night -discussed pros and cons of Botox.   5) Quadriceps weakness -she had a fall last week when using the bedside commode- her legs buckled. She has had no more falls since then  6) PAD -she would like to try Qutenza today -follows with vascular surgery and although she has PAD in both legs, she is only symptomatic in her right cald  7) Left arm weakness -she has not had any return in strength yet -she never received a TENs unit  -she would be interested in Vivistim eval  8) Nerve damage in left leg -has sensation -did nerve study -no pain.  -walking with hemiwalker -daughter and mother-in-law accompany her today -can go to bedroom to den.   9) Sleep apnea -has sleep  study tomorrow.   Pain Inventory Average Pain 8 Pain Right Now 8 My pain is constant, dull, and Poking pain  LOCATION OF PAIN  head  BOWEL Number of stools per week: 3 Oral laxative use Yes  Type of laxative miralax   BLADDER normal Bladder incontinence No    Mobility ability to climb steps?  yes do you drive?  no use a wheelchair needs help with transfers Do you have any goals in this area?  yes  Function disabled: date disabled .05/2021 I need assistance with the following:  dressing, bathing, toileting, meal prep, household duties, and shopping  Neuro/Psych weakness tingling trouble walking spasms depression anxiety  Prior Studies Any changes since last visit?  no  Physicians involved in your care No    Family History  Adopted: Yes  Problem Relation Age of Onset   Diabetes Mother    Diabetes Father    Alcohol abuse Father    Heart disease Father    Drug abuse Father    Stroke Sister    Anxiety disorder Sister    Social History   Socioeconomic History   Marital status: Married    Spouse name: Not on file   Number of children: 1   Years of education: 14   Highest education level: Not on file  Occupational History   Occupation: Disability  Tobacco Use   Smoking status: Former  Packs/day: 0.50    Years: 27.00    Total pack years: 13.50    Types: Cigarettes    Quit date: 2022    Years since quitting: 1.6   Smokeless tobacco: Never  Vaping Use   Vaping Use: Every day   Substances: Flavoring  Substance and Sexual Activity   Alcohol use: Not Currently    Alcohol/week: 0.0 standard drinks of alcohol    Comment: socially   Drug use: No   Sexual activity: Yes    Partners: Male    Birth control/protection: None  Other Topics Concern   Not on file  Social History Narrative   11/21/20   From: [name]   Living: with husband, [name] (2009) and special needs daughter    Work: disability       Family: daughter - [name]  (special needs, lives with her) and 2 grown step children - [name] and [name]       Enjoys: play with dogs - 2 german Shepard, mut, and blue tick walker mix      Exercise: PAD limits exercise   Diet: diabetic diet and low potassium due to daughter      Safety   Seat belts: Yes    Guns: Yes  and secure   Safe in relationships: Yes    Social Determinants of Health   Financial Resource Strain: Not on file  Food Insecurity: Not on file  Transportation Needs: Not on file  Physical Activity: Not on file  Stress: Not on file  Social Connections: Not on file   Past Surgical History:  Procedure Laterality Date   ABDOMINAL AORTOGRAM W/LOWER EXTREMITY [name] 10/15/2018   Procedure: ABDOMINAL AORTOGRAM W/LOWER EXTREMITY;  Surgeon: Wellington Hampshire, MD;  Location: [facility];  Service: Cardiovascular;  Laterality: [name];   ABDOMINAL AORTOGRAM W/LOWER EXTREMITY Bilateral 08/19/2019   Procedure: ABDOMINAL AORTOGRAM W/LOWER EXTREMITY;  Surgeon: Wellington Hampshire, MD;  Location: [facility];  Service: Cardiovascular;  Laterality: Bilateral;   CARDIAC CATHETERIZATION [name] 06/29/2016   Procedure: Left Heart Cath and Coronary Angiography;  Surgeon: Minna Merritts, MD;  Location: [facility];  Service: Cardiovascular;  Laterality: [name];   CARDIAC CATHETERIZATION [name] 08/29/2016   Procedure: Left Heart Cath and Cors/Grafts Angiography;  Surgeon: Wellington Hampshire, MD;  Location: [facility];  Service: Cardiovascular;  Laterality: [name];   CESAREAN SECTION     CHOLECYSTECTOMY     CORONARY ARTERY BYPASS GRAFT [name] 07/06/2016   Procedure: CORONARY ARTERY BYPASS GRAFTING (CABG) x four, using left internal mammary artery and right leg greater saphenous vein harvested endoscopically;  Surgeon: Ivin Poot, MD;  Location: [facility];  Service: Open Heart Surgery;  Laterality: [name];   ENDARTERECTOMY Right 07/06/2016   Procedure: ENDARTERECTOMY CAROTID;  Surgeon: Rosetta Posner, MD;  Location: [facility];   Service: Vascular;  Laterality: Right;   ENDARTERECTOMY Right 04/27/2020   Procedure: REDO OF RIGHT ENDARTERECTOMY CAROTID;  Surgeon: Rosetta Posner, MD;  Location: [facility] OR;  Service: Vascular;  Laterality: Right;   LEFT HEART CATH AND CORS/GRAFTS ANGIOGRAPHY [name] 08/24/2020   Procedure: LEFT HEART CATH AND CORS/GRAFTS ANGIOGRAPHY;  Surgeon: Wellington Hampshire, MD;  Location: [facility];  Service: Cardiovascular;  Laterality: [name];   LEFT HEART CATH AND CORS/GRAFTS ANGIOGRAPHY [name] 01/29/2022   Procedure: LEFT HEART CATH AND CORS/GRAFTS ANGIOGRAPHY;  Surgeon: Wellington Hampshire, MD;  Location: [facility];  Service: Cardiovascular;  Laterality: [name];  PERIPHERAL VASCULAR CATHETERIZATION [name] 04/18/2016   Procedure: Renal Angiography;  Surgeon: Wellington Hampshire, MD;  Location: [facility];  Service: Cardiovascular;  Laterality: [name];   PERIPHERAL VASCULAR INTERVENTION Left 10/15/2018   Procedure: PERIPHERAL VASCULAR INTERVENTION;  Surgeon: Wellington Hampshire, MD;  Location: [facility];  Service: Cardiovascular;  Laterality: Left;  Left superficial femoral   TEE WITHOUT CARDIOVERSION [name] 07/06/2016   Procedure: TRANSESOPHAGEAL ECHOCARDIOGRAM (TEE);  Surgeon: Ivin Poot, MD;  Location: [facility];  Service: Open Heart Surgery;  Laterality: [name];   TONSILLECTOMY     Past Medical History:  Diagnosis Date   Acute respiratory failure with hypoxia (HCC) 06/24/2021   Arthralgia of temporomandibular joint    CAD, multiple vessel    a. 06/2016 Cath: ostLM 40%, ostLAD 40%, pLAD 95%, ost-pLCx 60%, pLCx 95%, mLCx 60%, mRCA 95%, D2 50%, LVSF nl;  b. 07/2016 CABG x 4 (LIMA->LAD, VG->Diag, VG->OM, VG->RCA); c. 08/2016 Cath: 3VD w/ 4/4 patent grafts. LAD distal to LIMA has diff dzs->Med rx; d. 08/2020 Cath: 4/4 patent grafts, native 3VD. EF 55-65%-->Med Rx.   Carotid arterial disease ([place])    a. 07/2016 s/p R CEA; b. 02/2021 U/S: RICA 76-28%, LICA 3-15%.   Cerebrovascular disease    Clotting disorder  ([place])    Depression    Diastolic dysfunction    a. 06/2016 Echo: EF 50-55%, mild inf wall HK, GR1DD, mild MR, RV sys fxn nl, mildly dilated LA, PASP nl; b. 06/2021 Echo: EF 60-65%, no rwma. Nl RV fxn.   Fatty liver disease, nonalcoholic 1761   History of blood transfusion    with heart surgery   HLD (hyperlipidemia)    Labile hypertension    a. prior renal ngiogram negative for RAS in 03/2016; b. catecholamines and metanephrines normal, mildly elevated renin with normal aldosterone and normal ratio in 02/2016   Myocardial infarction [facility]) 2017   Obesity    PAD (peripheral artery disease) ([place])    a. 09/2018 s/p L SFA stenting; b. 07/2019 Periph Angio: Patent m/d L SFA stent w/ 100% L SFA distal to stent. L AT 100d, L Peroneal diff dzs-->Med Rx; c. 02/2021 ABIs: stable @ 0.61 on R and 0.46 on L.   PTSD (post-traumatic stress disorder)    Tobacco abuse    Type 2 diabetes mellitus ([place]) 12/2015   BP 121/75   Pulse 71   Ht '5\' 7"'$  (1.702 m)   SpO2 96%   BMI 35.87 kg/m   Opioid Risk Score:   Fall Risk Score:  `1  Depression screen [facility] 2/9     08/09/2022   10:49 AM 11/10/2021    1:25 PM 09/06/2021    2:55 PM 08/09/2021   11:16 AM 05/10/2021    9:08 AM 02/23/2021    8:35 AM 02/20/2021   11:44 AM  Depression screen PHQ 2/9  Decreased Interest '1 1 3 2 '$ 0 0 0  Down, Depressed, Hopeless '1 1 3 3 '$ 0 0 0  PHQ - 2 Score '2 2 6 5 '$ 0 0 0  Altered sleeping    0     Tired, decreased energy    3     Change in appetite    2     Feeling bad or failure about yourself     3     Trouble concentrating    3     Moving slowly or fidgety/restless    2     Suicidal thoughts    0  PHQ-9 Score    18     Difficult doing work/chores    Very difficult        Review of Systems  Constitutional: Negative.   HENT: Negative.    Eyes: Negative.   Respiratory: Negative.    Cardiovascular: Negative.   Gastrointestinal:        Bowel control  Endocrine: Negative.   Genitourinary:        Bladder control   Musculoskeletal:  Positive for gait problem.  Skin: Negative.   Allergic/Immunologic: Negative.   Neurological:  Positive for weakness.  Hematological:  Bruises/bleeds easily.       Plavix  Psychiatric/Behavioral:  Positive for confusion and dysphoric mood. The patient is nervous/anxious.   All other systems reviewed and are negative.      Objective:   Physical Exam Patient appears to be comfortable, alert and oriented, able to answer questions well.       Assessment & Plan:  Erika [name] is a very pleasant 51 year old woman who presents for f/u of fall post-CVA, and chronic headaches.   1) CVA: Encouraged increasing walking distance every day -referred to neurology for follow-up, discussed that she was prescribed Vascepa yesterday by Dr. Felecia Shelling.  -continue home therapies -continue to push yourself every day  2) Migraines -failed multiple medications -present every day -discussed Botox, she is wary of getting needles in her face but will consider.  -UDS and pain contract signed -continue lyrica '225mg'$  BID, discussed we will try to wean next visit.  -made goal to replace soft drinks with electrolyte water -will restart oxycodone, need new urine sample and UDS today -refilled Fioricet.  -recommended Botox next visit  3) HTN: -BP is 121/75 today.  HTN: -BP is ___today.  -Advised checking BP daily at home and logging results to bring into follow-up appointment with PCP and myself. -Reviewed BP meds today.  -Advised regarding healthy foods that can help lower blood pressure and provided with a list: 1) citrus foods- high in vitamins and minerals 2) salmon and other fatty fish - reduces inflammation and oxylipins 3) swiss chard (leafy green)- high level of nitrates 4) pumpkin seeds- one of the best natural sources of magnesium 5) Beans and lentils- high in fiber, magnesium, and potassium 6) Berries- high in flavonoids 7) Amaranth (whole grain, can be cooked similarly to  rice and oats)- high in magnesium and fiber 8) Pistachios- even more effective at reducing BP than other nuts 9) Carrots- high in phenolic compounds that relax blood vessels and reduce inflammation 10) Celery- contain phthalides that relax tissues of arterial walls 11) Tomatoes- can also improve cholesterol and reduce risk of heart disease 12) Broccoli- good source of magnesium, calcium, and potassium 13) Greek yogurt: high in potassium and calcium 14) Herbs and spices: Celery seed, cilantro, saffron, lemongrass, black cumin, ginseng, cinnamon, cardamom, sweet basil, and ginger 15) Chia and flax seeds- also help to lower cholesterol and blood sugar 16) Beets- high levels of nitrates that relax blood vessels  17) spinach and bananas- high in potassium  -Provided lise of supplements that can help with hypertension:  1) magnesium: one high quality brand is Bioptemizers since it contains all 7 types of magnesium, otherwise over the counter magnesium gluconate '400mg'$  is a good option 2) B vitamins 3) vitamin D 4) potassium 5) CoQ10 6) L-arginine 7) Vitamin C 8) Beetroot -Educated that goal BP is 120/80. -Made goal to incorporate some of the above foods into diet.    4) Diabetes: -  check CBGs daily, log, and bring log to follow-up appointment -try to incorporate into your diet some of the following foods which are good for diabetes: 1) cinnamon- imitates effects of insulin, increasing glucose transport into cells ([place] or [place] cinnamon is best, least processed) 2) nuts- can slow down the blood sugar response of carbohydrate rich foods 3) oatmeal- contains and anti-inflammatory compound avenanthramide 4) whole-milk yogurt (best types are no sugar, [place] yogurt, or goat/sheep yogurt) 5) beans- high in protein, fiber, and vitamins, low glycemic index 6) broccoli- great source of vitamin A and C 7) quinoa- higher in protein and fiber than other grains 8) spinach- high in vitamin A,  fiber, and protein 9) olive oil- reduces glucose levels, LDL, and triglycerides 10) salmon- excellent amount of omega-3-fatty acids 11) walnuts- rich in antioxidants 12) apples- high in fiber and quercetin 13) carrots- highly nutritious with low impact on blood sugar 14) eggs- improve HDL (good cholesterol), high in protein, keep you satiated 15) turmeric: improves blood sugars, cardiovascular disease, and protects kidney health 16) garlic: improves blood sugar, blood pressure, pain 17) tomatoes: highly nutritious with low impact on blood sugar   5) left foot drop: continue AFO ordered from hangar  6) Left sided post-stroke shoulder pain --Discussed Sprint PNS system as an option of pain treatment via neuromodulation. Provided following link for patient to learn more about the system: https://www.sprtherapeutics.com/. Explained mechanism of activating A beta fibers which function in touch, pressure, and vibration, to inhibit the sensations of A delta and C fibers which are responsible for pain transmission.   -continue therapy -E-stim ordered due to flaccidity, atrophy, pain- discussed where e-stim should be placed  7) Quadriceps weakness contributing to fall- recommended seated quadriceps strengthening exercises daily. Continue PT and OT  8) Insomnia: resolved, discontinue trazodone. -sent script for amitriptyline '10mg'$  HS for 7 days  9) Multiple falls: resolved  10) PAD -Discussed Qutenza as an option for neuropathic pain control. Discussed that this is a capsaicin patch, stronger than capsaicin cream. Discussed that it is currently approved for diabetic peripheral neuropathy and post-herpetic neuralgia, but that it has also shown benefit in treating other forms of neuropathy. Provided patient with link to site to learn more about the patch: [employer]. Discussed that the patch would be placed in office and benefits usually last 3 months. Discussed that unintended exposure to  capsaicin can cause severe irritation of eyes, mucous membranes, respiratory tract, and skin, but that Qutenza is a local treatment and does not have the systemic side effects of other nerve medications. Discussed that there may be pain, itching, erythema, and decreased sensory function associated with the application of Qutenza. Side effects usually subside within 1 week. A cold pack of analgesic medications can help with these side effects. Blood pressure can also be increased due to pain associated with administration of the patch.    11) Dermatitis -provided referral to dermatology

## 2022-08-10 ENCOUNTER — Ambulatory Visit: Payer: BC Managed Care – PPO | Attending: Pulmonary Disease

## 2022-08-10 ENCOUNTER — Ambulatory Visit: Payer: Managed Care, Other (non HMO) | Admitting: Physical Medicine and Rehabilitation

## 2022-08-10 DIAGNOSIS — G4733 Obstructive sleep apnea (adult) (pediatric): Secondary | ICD-10-CM | POA: Insufficient documentation

## 2022-08-10 DIAGNOSIS — G4739 Other sleep apnea: Secondary | ICD-10-CM | POA: Insufficient documentation

## 2022-08-10 DIAGNOSIS — R0902 Hypoxemia: Secondary | ICD-10-CM | POA: Insufficient documentation

## 2022-08-10 DIAGNOSIS — R0683 Snoring: Secondary | ICD-10-CM | POA: Diagnosis not present

## 2022-08-11 DIAGNOSIS — S8252XD Displaced fracture of medial malleolus of left tibia, subsequent encounter for closed fracture with routine healing: Secondary | ICD-10-CM | POA: Diagnosis not present

## 2022-08-14 ENCOUNTER — Other Ambulatory Visit: Payer: Self-pay

## 2022-08-14 ENCOUNTER — Telehealth: Payer: Self-pay

## 2022-08-14 ENCOUNTER — Telehealth: Payer: Self-pay | Admitting: *Deleted

## 2022-08-14 ENCOUNTER — Other Ambulatory Visit: Payer: Self-pay | Admitting: Physical Medicine and Rehabilitation

## 2022-08-14 DIAGNOSIS — G44029 Chronic cluster headache, not intractable: Secondary | ICD-10-CM

## 2022-08-14 LAB — TOXASSURE SELECT,+ANTIDEPR,UR

## 2022-08-14 MED ORDER — PANTOPRAZOLE SODIUM 40 MG PO TBEC
40.0000 mg | DELAYED_RELEASE_TABLET | Freq: Every day | ORAL | 0 refills | Status: DC
Start: 2022-08-14 — End: 2022-09-04

## 2022-08-14 MED ORDER — OXYCODONE HCL 10 MG PO TABS
10.0000 mg | ORAL_TABLET | Freq: Every day | ORAL | 0 refills | Status: DC | PRN
Start: 2022-08-14 — End: 2022-08-24

## 2022-08-14 NOTE — Telephone Encounter (Signed)
Erika [name] called to see if you will be prescribing her pain medication?  If so please send to [facility] in [place].  Call back phone [phone].

## 2022-08-14 NOTE — Telephone Encounter (Signed)
Urine drug screen was negative for controlled substances which would be expected.

## 2022-08-16 ENCOUNTER — Telehealth (INDEPENDENT_AMBULATORY_CARE_PROVIDER_SITE_OTHER): Payer: BC Managed Care – PPO | Admitting: Pulmonary Disease

## 2022-08-16 DIAGNOSIS — G4733 Obstructive sleep apnea (adult) (pediatric): Secondary | ICD-10-CM

## 2022-08-16 NOTE — Telephone Encounter (Signed)
ATC patient-unable to leave vm due to mailbox being full.  Will call back.  

## 2022-08-16 NOTE — Telephone Encounter (Signed)
Baseline study showed few central apneas 4/hour and total AHI 12/hour.  CPAP was titrated 5 to 13 cm, central apneas emerged and persisted  Options include -no treatment -Treatment with oral appliance -Trial of CPAP auto 5 to 13 cm , review download to see if central events go away by the 55-monthmark -If central events persist then may need alternative PAP such as bilevel or ASV   Suggest referral to sleep physician to manage

## 2022-08-16 NOTE — Telephone Encounter (Signed)
Per Dr. Elsworth Soho:  Baseline study showed few central apneas 4/hour and total AHI 12/hour.   CPAP was titrated 5 to 13 cm, central apneas emerged and persisted   Options include -no treatment -Treatment with oral appliance -Trial of CPAP auto 5 to 13 cm , review download to see if central events go away by the 36-monthmark -If central events persist then may need alternative PAP such as bilevel or ASV  Suggest referral to sleep physician to manage  Recommend follow highlighted recommendations above needs referral to sleep medicine ASAP.

## 2022-08-17 NOTE — Telephone Encounter (Signed)
Patient's spouse, [name](DPR) is aware of results and voiced his understanding.  Order placed for cpap. Patient will call once setup on machine to schedule OV.  Nothing further needed.

## 2022-08-17 NOTE — Addendum Note (Signed)
Addended by: Claudette Head A on: 08/17/2022 09:43 AM   Modules accepted: Orders

## 2022-08-17 NOTE — Telephone Encounter (Signed)
ATC patient x2--unable to leave vm due to mailbox being full.  Will close encounter per office protocol.  Letter has been mailed to address on file.

## 2022-08-23 ENCOUNTER — Ambulatory Visit: Payer: BC Managed Care – PPO | Admitting: Pulmonary Disease

## 2022-08-24 ENCOUNTER — Telehealth: Payer: Self-pay

## 2022-08-24 DIAGNOSIS — G44029 Chronic cluster headache, not intractable: Secondary | ICD-10-CM

## 2022-08-24 NOTE — Telephone Encounter (Signed)
Patient appointment moved to 09/18/2022. She has been informed. Patient will be out of Oxycodone 10 MG on 09/04/2022. Please send refill to  [facility]  in [place].

## 2022-08-28 MED ORDER — OXYCODONE HCL 10 MG PO TABS: 10.0000 mg | ORAL_TABLET | Freq: Every day | ORAL | 0 refills | Status: DC | PRN

## 2022-08-28 NOTE — Telephone Encounter (Signed)
PMP was Reviewed.  Dr Ranell Patrick note was reviewed.  Oxycodone e-scribed today

## 2022-09-04 ENCOUNTER — Ambulatory Visit: Payer: BC Managed Care – PPO | Admitting: Registered Nurse

## 2022-09-04 ENCOUNTER — Other Ambulatory Visit: Payer: Self-pay | Admitting: Physical Medicine and Rehabilitation

## 2022-09-07 DIAGNOSIS — G4733 Obstructive sleep apnea (adult) (pediatric): Secondary | ICD-10-CM | POA: Diagnosis not present

## 2022-09-11 ENCOUNTER — Telehealth: Payer: Self-pay

## 2022-09-11 DIAGNOSIS — S8252XD Displaced fracture of medial malleolus of left tibia, subsequent encounter for closed fracture with routine healing: Secondary | ICD-10-CM | POA: Diagnosis not present

## 2022-09-11 NOTE — Telephone Encounter (Signed)
Rx denied by Dr. Ranell Patrick on 09/11/2022. Patient informed. She has been advised to call the PCP for a Potassium recheck before any refills. Patient understood.

## 2022-09-18 ENCOUNTER — Encounter: Payer: BC Managed Care – PPO | Attending: Physical Medicine and Rehabilitation | Admitting: Registered Nurse

## 2022-09-18 ENCOUNTER — Encounter: Payer: Self-pay | Admitting: Registered Nurse

## 2022-09-18 VITALS — BP 143/88 | HR 88 | Ht 67.0 in | Wt 228.4 lb

## 2022-09-18 DIAGNOSIS — F5101 Primary insomnia: Secondary | ICD-10-CM

## 2022-09-18 DIAGNOSIS — I639 Cerebral infarction, unspecified: Secondary | ICD-10-CM | POA: Insufficient documentation

## 2022-09-18 DIAGNOSIS — G44029 Chronic cluster headache, not intractable: Secondary | ICD-10-CM | POA: Insufficient documentation

## 2022-09-18 DIAGNOSIS — Z5181 Encounter for therapeutic drug level monitoring: Secondary | ICD-10-CM | POA: Insufficient documentation

## 2022-09-18 DIAGNOSIS — G894 Chronic pain syndrome: Secondary | ICD-10-CM | POA: Insufficient documentation

## 2022-09-18 DIAGNOSIS — Z79899 Other long term (current) drug therapy: Secondary | ICD-10-CM | POA: Insufficient documentation

## 2022-09-18 MED ORDER — OXYCODONE HCL 10 MG PO TABS: 10.0000 mg | ORAL_TABLET | Freq: Every day | ORAL | 0 refills | Status: DC | PRN

## 2022-09-18 MED ORDER — AMITRIPTYLINE HCL 10 MG PO TABS
10.0000 mg | ORAL_TABLET | Freq: Every day | ORAL | 1 refills | Status: DC
Start: 2022-09-18 — End: 2022-10-09

## 2022-09-18 NOTE — Progress Notes (Unsigned)
Subjective:    Patient ID: Erika [name], female    DOB: 008/30/1972, 51 y.o.   MRN: [id number]  HPI: CYTHINA MICKELSEN is a 51 y.o. female who returns for follow up appointment for chronic pain and medication refill. states *** pain is located in  ***. rates pain ***. current exercise regime is walking and performing stretching exercises.    Pain Inventory Average Pain 8 Pain Right Now 7 My pain is constant and aching  In the last 24 hours, has pain interfered with the following? General activity 7 Relation with others 2 Enjoyment of life 4 What TIME of day is your pain at its worst? morning  Sleep (in general) Poor  Pain is worse with: unsure Pain improves with: medication Relief from Meds: 2  Family History  Adopted: Yes  Problem Relation Age of Onset   Diabetes Mother    Diabetes Father    Alcohol abuse Father    Heart disease Father    Drug abuse Father    Stroke Sister    Anxiety disorder Sister    Social History   Socioeconomic History   Marital status: Married    Spouse name: Not on file   Number of children: 1   Years of education: 14   Highest education level: Not on file  Occupational History   Occupation: Disability  Tobacco Use   Smoking status: Former    Packs/day: 0.50    Years: 27.00    Total pack years: 13.50    Types: Cigarettes    Quit date: 2022    Years since quitting: 1.7   Smokeless tobacco: Never  Vaping Use   Vaping Use: Every day   Substances: Flavoring  Substance and Sexual Activity   Alcohol use: Not Currently    Alcohol/week: 0.0 standard drinks of alcohol    Comment: socially   Drug use: No   Sexual activity: Yes    Partners: Male    Birth control/protection: None  Other Topics Concern   Not on file  Social History Narrative   11/21/20   From: [name]   Living: with husband, [name] (2009) and special needs daughter    Work: disability       Family: daughter - [name] (special needs, lives with  her) and 2 grown step children - [name] and [name]       Enjoys: play with dogs - 2 german Shepard, mut, and blue tick walker mix      Exercise: PAD limits exercise   Diet: diabetic diet and low potassium due to daughter      Safety   Seat belts: Yes    Guns: Yes  and secure   Safe in relationships: Yes    Social Determinants of Health   Financial Resource Strain: Not on file  Food Insecurity: Not on file  Transportation Needs: Not on file  Physical Activity: Not on file  Stress: Not on file  Social Connections: Not on file   Past Surgical History:  Procedure Laterality Date   ABDOMINAL AORTOGRAM W/LOWER EXTREMITY [name] 10/15/2018   Procedure: ABDOMINAL AORTOGRAM W/LOWER EXTREMITY;  Surgeon: Wellington Hampshire, MD;  Location: [facility];  Service: Cardiovascular;  Laterality: [name];   ABDOMINAL AORTOGRAM W/LOWER EXTREMITY Bilateral 08/19/2019   Procedure: ABDOMINAL AORTOGRAM W/LOWER EXTREMITY;  Surgeon: Wellington Hampshire, MD;  Location: [facility];  Service: Cardiovascular;  Laterality: Bilateral;   CARDIAC CATHETERIZATION [name] 06/29/2016   Procedure: Left Heart Cath and  Coronary Angiography;  Surgeon: Minna Merritts, MD;  Location: [facility];  Service: Cardiovascular;  Laterality: [name];   CARDIAC CATHETERIZATION [name] 08/29/2016   Procedure: Left Heart Cath and Cors/Grafts Angiography;  Surgeon: Wellington Hampshire, MD;  Location: [facility];  Service: Cardiovascular;  Laterality: [name];   CESAREAN SECTION     CHOLECYSTECTOMY     CORONARY ARTERY BYPASS GRAFT [name] 07/06/2016   Procedure: CORONARY ARTERY BYPASS GRAFTING (CABG) x four, using left internal mammary artery and right leg greater saphenous vein harvested endoscopically;  Surgeon: Ivin Poot, MD;  Location: [facility];  Service: Open Heart Surgery;  Laterality: [name];   ENDARTERECTOMY Right 07/06/2016   Procedure: ENDARTERECTOMY CAROTID;  Surgeon: Rosetta Posner, MD;  Location[facility];  Service: Vascular;   Laterality: Right;   ENDARTERECTOMY Right 04/27/2020   Procedure: REDO OF RIGHT ENDARTERECTOMY CAROTID;  Surgeon: Rosetta Posner, MD;  Location: [facility] OR;  Service: Vascular;  Laterality: Right;   LEFT HEART CATH AND CORS/GRAFTS ANGIOGRAPHY [name] 08/24/2020   Procedure: LEFT HEART CATH AND CORS/GRAFTS ANGIOGRAPHY;  Surgeon: Wellington Hampshire, MD;  Location: [facility];  Service: Cardiovascular;  Laterality: [name];   LEFT HEART CATH AND CORS/GRAFTS ANGIOGRAPHY [name] 01/29/2022   Procedure: LEFT HEART CATH AND CORS/GRAFTS ANGIOGRAPHY;  Surgeon: Wellington Hampshire, MD;  Location: [facility];  Service: Cardiovascular;  Laterality: [name];   PERIPHERAL VASCULAR CATHETERIZATION [name] 04/18/2016   Procedure: Renal Angiography;  Surgeon: Wellington Hampshire, MD;  Location: [place] [facility];  Service: Cardiovascular;  Laterality: [name];   PERIPHERAL VASCULAR INTERVENTION Left 10/15/2018   Procedure: PERIPHERAL VASCULAR INTERVENTION;  Surgeon: Wellington Hampshire, MD;  Location: [place];  Service: Cardiovascular;  Laterality: Left;  Left superficial femoral   TEE WITHOUT CARDIOVERSION [name] 07/06/2016   Procedure: TRANSESOPHAGEAL ECHOCARDIOGRAM (TEE);  Surgeon: Ivin Poot, MD;  Location: [facility];  Service: Open Heart Surgery;  Laterality: [name];   TONSILLECTOMY     Past Surgical History:  Procedure Laterality Date   ABDOMINAL AORTOGRAM W/LOWER EXTREMITY [name] 10/15/2018   Procedure: ABDOMINAL AORTOGRAM W/LOWER EXTREMITY;  Surgeon: Wellington Hampshire, MD;  Location: [facility];  Service: Cardiovascular;  Laterality: [name];   ABDOMINAL AORTOGRAM W/LOWER EXTREMITY Bilateral 08/19/2019   Procedure: ABDOMINAL AORTOGRAM W/LOWER EXTREMITY;  Surgeon: Wellington Hampshire, MD;  Location: [facility];  Service: Cardiovascular;  Laterality: Bilateral;   CARDIAC CATHETERIZATION [name] 06/29/2016   Procedure: Left Heart Cath and Coronary Angiography;  Surgeon: Minna Merritts, MD;  Location: [place] [facility];   Service: Cardiovascular;  Laterality: [name];   CARDIAC CATHETERIZATION [name] 08/29/2016   Procedure: Left Heart Cath and Cors/Grafts Angiography;  Surgeon: Wellington Hampshire, MD;  Location: [facility];  Service: Cardiovascular;  Laterality: [name];   CESAREAN SECTION     CHOLECYSTECTOMY     CORONARY ARTERY BYPASS GRAFT [name] 07/06/2016   Procedure: CORONARY ARTERY BYPASS GRAFTING (CABG) x four, using left internal mammary artery and right leg greater saphenous vein harvested endoscopically;  Surgeon: Ivin Poot, MD;  Location: [facility];  Service: Open Heart Surgery;  Laterality: [name];   ENDARTERECTOMY Right 07/06/2016   Procedure: ENDARTERECTOMY CAROTID;  Surgeon: Rosetta Posner, MD;  Location: [facility] OR;  Service: Vascular;  Laterality: Right;   ENDARTERECTOMY Right 04/27/2020   Procedure: REDO OF RIGHT ENDARTERECTOMY CAROTID;  Surgeon: Rosetta Posner, MD;  Location: [facility];  Service: Vascular;  Laterality:  Right;   LEFT HEART CATH AND CORS/GRAFTS ANGIOGRAPHY [name] 08/24/2020   Procedure: LEFT HEART CATH AND CORS/GRAFTS ANGIOGRAPHY;  Surgeon: Wellington Hampshire, MD;  Location: [facility];  Service: Cardiovascular;  Laterality: [name];   LEFT HEART CATH AND CORS/GRAFTS ANGIOGRAPHY [name] 01/29/2022   Procedure: LEFT HEART CATH AND CORS/GRAFTS ANGIOGRAPHY;  Surgeon: Wellington Hampshire, MD;  Location: [facility];  Service: Cardiovascular;  Laterality: [name];   PERIPHERAL VASCULAR CATHETERIZATION [name] 04/18/2016   Procedure: Renal Angiography;  Surgeon: Wellington Hampshire, MD;  Location: [facility];  Service: Cardiovascular;  Laterality: [name];   PERIPHERAL VASCULAR INTERVENTION Left 10/15/2018   Procedure: PERIPHERAL VASCULAR INTERVENTION;  Surgeon: Wellington Hampshire, MD;  Location: [facility];  Service: Cardiovascular;  Laterality: Left;  Left superficial femoral   TEE WITHOUT CARDIOVERSION [name] 07/06/2016   Procedure: TRANSESOPHAGEAL ECHOCARDIOGRAM (TEE);  Surgeon: Ivin Poot, MD;  Location: [facility];  Service: Open Heart Surgery;  Laterality: [name];   TONSILLECTOMY     Past Medical History:  Diagnosis Date   Acute respiratory failure with hypoxia (HCC) 06/24/2021   Arthralgia of temporomandibular joint    CAD, multiple vessel    a. 06/2016 Cath: ostLM 40%, ostLAD 40%, pLAD 95%, ost-pLCx 60%, pLCx 95%, mLCx 60%, mRCA 95%, D2 50%, LVSF nl;  b. 07/2016 CABG x 4 (LIMA->LAD, VG->Diag, VG->OM, VG->RCA); c. 08/2016 Cath: 3VD w/ 4/4 patent grafts. LAD distal to LIMA has diff dzs->Med rx; d. 08/2020 Cath: 4/4 patent grafts, native 3VD. EF 55-65%-->Med Rx.   Carotid arterial disease ([name])    a. 07/2016 s/p R CEA; b. 02/2021 U/S: RICA 21-30%, LICA 8-65%.   Cerebrovascular disease    Clotting disorder ([place])    Depression    Diastolic dysfunction    a. 06/2016 Echo: EF 50-55%, mild inf wall HK, GR1DD, mild MR, RV sys fxn nl, mildly dilated LA, PASP nl; b. 06/2021 Echo: EF 60-65%, no rwma. Nl RV fxn.   Fatty liver disease, nonalcoholic 7846   History of blood transfusion    with heart surgery   HLD (hyperlipidemia)    Labile hypertension    a. prior renal ngiogram negative for RAS in 03/2016; b. catecholamines and metanephrines normal, mildly elevated renin with normal aldosterone and normal ratio in 02/2016   Myocardial infarction [facility]) 2017   Obesity    PAD (peripheral artery disease) ([place])    a. 09/2018 s/p L SFA stenting; b. 07/2019 Periph Angio: Patent m/d L SFA stent w/ 100% L SFA distal to stent. L AT 100d, L Peroneal diff dzs-->Med Rx; c. 02/2021 ABIs: stable @ 0.61 on R and 0.46 on L.   PTSD (post-traumatic stress disorder)    Tobacco abuse    Type 2 diabetes mellitus ([place]) 12/2015   BP (!) 143/88   Pulse 85   Ht '5\' 7"'$  (1.702 m)   Wt 228 lb 6.4 oz (103.6 kg)   SpO2 (!) 89%   BMI 35.77 kg/m   Opioid Risk Score:   Fall Risk Score:  `1  Depression screen [facility] 2/9     08/09/2022   10:49 AM 11/10/2021    1:25 PM 09/06/2021    2:55 PM 08/09/2021   11:16 AM 05/10/2021    9:08 AM 02/23/2021     8:35 AM 02/20/2021   11:44 AM  Depression screen PHQ 2/9  Decreased Interest '1 1 3 2 '$ 0 0 0  Down, Depressed, Hopeless '1 1 3 3 '$ 0 0  0  PHQ - 2 Score '2 2 6 5 '$ 0 0 0  Altered sleeping    0     Tired, decreased energy    3     Change in appetite    2     Feeling bad or failure about yourself     3     Trouble concentrating    3     Moving slowly or fidgety/restless    2     Suicidal thoughts    0     PHQ-9 Score    18     Difficult doing work/chores    Very difficult         Review of Systems  Neurological:  Positive for headaches.      Objective:   Physical Exam        Assessment & Plan:  Cerebrovascular Accident: Neurology Following. Continue current medication regimen. Continue to Monitor.  Delirium: Neurology Following. Continue to Monitor.  Bilateral Lower Extremities Pain: Continue HEP as Tolerated. Continue to Monitor.  Chronic Pain Refilled: Oxycodone 5 mg daily as needed for pain . #30. We will continue the opioid monitoring program, this consists of regular clinic visits, examinations, urine drug screen, pill counts as well as use of [place] Controlled Substance Reporting system. A 12 month History has been reviewed on the [place] Controlled Substance Reporting System on 09/11/2021   Neuropathy: Continue Lyrica. Continue to Monitor.    F/U in 1 month

## 2022-09-25 ENCOUNTER — Ambulatory Visit: Payer: BC Managed Care – PPO | Admitting: Physical Medicine and Rehabilitation

## 2022-09-25 ENCOUNTER — Encounter: Payer: Self-pay | Admitting: Pulmonary Disease

## 2022-09-25 ENCOUNTER — Ambulatory Visit (INDEPENDENT_AMBULATORY_CARE_PROVIDER_SITE_OTHER): Payer: BC Managed Care – PPO | Admitting: Pulmonary Disease

## 2022-09-25 VITALS — BP 134/86 | HR 89 | Temp 98.3°F | Ht 67.0 in

## 2022-09-25 DIAGNOSIS — I6350 Cerebral infarction due to unspecified occlusion or stenosis of unspecified cerebral artery: Secondary | ICD-10-CM | POA: Diagnosis not present

## 2022-09-25 DIAGNOSIS — G4731 Primary central sleep apnea: Secondary | ICD-10-CM | POA: Diagnosis not present

## 2022-09-25 DIAGNOSIS — R0902 Hypoxemia: Secondary | ICD-10-CM | POA: Diagnosis not present

## 2022-09-25 NOTE — Progress Notes (Signed)
Subjective:    Patient ID: Erika [name], female    DOB: 20-Jun-1971, 51 y.o.   MRN: [id number] Patient Care Team: Lesleigh Noe, MD as PCP - General (Family Medicine) Wellington Hampshire, MD as PCP - Cardiology (Cardiology)  Chief Complaint  Patient presents with   Follow-up    SOB in the mornings and when walking to the short distances. O2 stats drop when she has headaches. Deep cough daily.    HPI Patient is a 51 year old former smoker (quit 2022, 13.5 PY) with a history as noted below, who presents for follow-up of "shallow breathing" and recent episodes of low oxygen saturations by home oximeter.The patient states that she noticed initial issues after a right pontine stroke in July of last year but issues have gotten worse over the last few months.  She notes that particularly in the evenings and just prior to falling asleep she is aware that she is not taking deep breaths or that she "forgets to breathe".  She has nocturnal awakenings which she cannot explain what are they triggered by.  She is also noted to have significant snoring.  She was initially evaluated here on 29 May 2022.  Because of the history of significant apneas noted by her husband while she slept, the patient underwent sleep study.  Sleep study on 09 July 2022 showed AHI of 12/h she was also noted to have a few centrals.  Because of difficulties with adjusting CPAP CPAP titration study was ordered and this was performed on 22 August.  CPAP was suggested from 5-13 with central apneas emerging.  She was set to an AutoSet 5-13 at home.  She has not been tolerating this well at all.  She states that her husband notes that she has significant apneic episodes even with the CPAP. She has a significant history of cerebrovascular disease and has had at least 4 strokes the most recent 2 being in the pontine area.  She is also on multiple sedating medications.  She has a residual left hemiparesis from prior stroke.  She has not  noted any fevers, chills or sweats.  No chest pain.  No wheezing.  No shortness of breath per se.  No cough sputum production or hemoptysis.   Her symptoms are not improved by anything except being cognizant that she needs to take a deep breath and then taking a deep breath at that time.  She has developed some issues with headaches which are being evaluated by her primary physician.  Husband states that she has been having some issues of low oxygen saturations particularly when she has a headache.  However it is noted that he has been checking oximetries on her left hand and this is her area of hemiparesis.  We did observe a discrepancy between right and left side today.  Oxygen saturations today are consistently at 94%.  She has not been able to tolerate the CPAP at all.  She has not been able to tolerate it for 4 hours but only 4 days out of 17.  Husband feels that it makes her apneic episodes worse.  On her previous sleep study was recommended to have sleep medicine evaluate her.  Concur with this.  Review of Systems A 10 point review of systems was performed and it is as noted above otherwise negative.  Patient Active Problem List   Diagnosis Date Noted   Scalp psoriasis 06/11/2022   Coccyx pain 06/11/2022   Hypoxia 05/25/2022   Snoring 05/25/2022  History of tobacco abuse 05/25/2022   Dermatitis 03/19/2022   Mild vascular dementia without behavioral disturbance, psychotic disturbance, mood disturbance, or anxiety ([place]) 19/50/9326   Acute metabolic encephalopathy/delirium/polypharmacy 01/31/2022   Demand ischemia    Pressure injury of skin 01/27/2022   Cardiomegaly    Polypharmacy    COVID-19 virus infection 12/22/2021   Hyperglycemia 12/22/2021   Major depressive disorder, recurrent episode, mild ([place]) 11/26/2021   Ankle fracture, left    Closed avulsion fracture of medial malleolus of left tibia    Cerebrovascular accident (CVA) due to occlusion of vertebral artery ([place])  09/11/2021   Cognitive change 09/11/2021   Cognitive communication deficit 08/10/2021   Urinary incontinence 08/10/2021   GERD (gastroesophageal reflux disease) 08/03/2021   Chronic post-traumatic stress disorder (PTSD)    Depression    Right pontine stroke ([place]) 06/28/2021   Hemiplegia and hemiparesis following cerebral infarction affecting left non-dominant side ([place]) 06/24/2021   Chronic diastolic CHF (congestive heart failure) ([place]) 06/24/2021   Fall 06/24/2021   Abnormal LFTs 06/24/2021   Olecranon bursitis of left elbow 06/12/2021   Chronic hip pain (Bilateral) 06/09/2021   Chronic use of opiate for therapeutic purpose 05/09/2021   Greater trochanteric bursitis of hip (Left) 03/09/2021   Bursitis of hip (Left) 71/24/5809   Uncomplicated opioid dependence ([place]) 02/20/2021   Acute conjunctivitis of left eye 01/02/2021   Unintentional weight loss 11/21/2020   Broken teeth (Right) 08/02/2020   History of MI (myocardial infarction) (July 2017) 08/02/2020   Neuropathy 06/09/2020   Dental abscess 06/09/2020   Foot drop (Left) 06/09/2020   Carotid stenosis, asymptomatic, right 04/27/2020   Chronic migraine 11/03/2019   Thrombocytosis 09/16/2019   Erythrocytosis 09/16/2019   Hypercalcemia 09/06/2019   Leukocytosis 09/06/2019   Polycythemia 09/06/2019   Chronic hip pain (Left) 08/27/2019   Osteoarthritis of hip (Left) 08/27/2019   Gluteal tendonitis of buttock (Left) 08/27/2019   Radial nerve palsy (Right) 07/15/2019   Neuropathy of radial nerve (Right) 06/30/2019   Abnormal bruising 06/25/2019   History of carotid endarterectomy (Right) 03/04/2019   Pain medication agreement signed 03/04/2019   Atypical facial pain (Right) 01/27/2019   Chronic ear pain (Right) 01/27/2019   Chronic jaw pain (Right) 01/27/2019   Geniculate Neuralgia (Right) 01/27/2019   Vitamin D deficiency 01/19/2019   Neurogenic pain 01/19/2019   Chronic anticoagulation (PLAVIX) 01/19/2019   Chronic pain  syndrome 01/07/2019   Long term current use of opiate analgesic 01/07/2019   Long term prescription benzodiazepine use 01/07/2019   Pharmacologic therapy 01/07/2019   Disorder of skeletal system 01/07/2019   Problems influencing health status 01/07/2019   Chronic headaches (1ry area of Pain) (Right) 01/07/2019   Opiate use 09/24/2018   PVD (peripheral vascular disease) ([place]) 06/24/2018   Chronic ankle pain (Bilateral) 12/27/2017   Tendinopathy of gluteus medius (Right) 12/27/2017   Tendinopathy of gluteus medius (Left) 12/27/2017   Bilateral hip pain 12/26/2017   Chronic elbow pain (Left) 10/17/2017   Elevated troponin I level 05/21/2017   Major depressive disorder, recurrent episode, moderate (HCC) 11/19/2016   Insomnia 10/30/2016   Constipation 07/25/2016   S/P CABG x 4 07/06/2016   Bradycardia    CAD (coronary artery disease)    Carotid stenosis    CAD in native artery 06/29/2016   Elevated troponin 06/28/2016   Essential hypertension, malignant 06/28/2016   Mild tobacco abuse in early remission 06/28/2016   Essential hypertension    Malignant hypertension    Type 2 diabetes mellitus with other  specified complication ([place])    Chest pain with high risk for cardiac etiology 06/27/2016   NSTEMI (non-ST elevated myocardial infarction) ([place]) 06/27/2016   Proteinuria 03/15/2016   Renal artery stenosis ([place]) 03/15/2016   MDD (major depressive disorder) 10/17/2015   Agoraphobia with panic attacks 04/25/2015   HTN (hypertension), malignant 10/20/2013   Debility 04/02/2013   Cluster headache 03/20/2012   HLD (hyperlipidemia) 07/26/2010   Social History   Tobacco Use   Smoking status: Former    Packs/day: 0.50    Years: 27.00    Total pack years: 13.50    Types: Cigarettes    Quit date: 2022    Years since quitting: 1.7   Smokeless tobacco: Never  Substance Use Topics   Alcohol use: Not Currently    Alcohol/week: 0.0 standard drinks of alcohol    Comment: socially    Allergies  Allergen Reactions   Chantix [Varenicline Tartrate] Other (See Comments)    Feels "crazy" and angry     Current Meds  Medication Sig   acetaminophen (TYLENOL) 325 MG tablet Take 1-2 tablets (325-650 mg total) by mouth every 4 (four) hours as needed for mild pain.   albuterol (VENTOLIN HFA) 108 (90 Base) MCG/ACT inhaler Inhale 2 puffs into the lungs every 6 (six) hours as needed for wheezing.   amitriptyline (ELAVIL) 10 MG tablet Take 1 tablet (10 mg total) by mouth at bedtime.   ascorbic acid (VITAMIN C) 500 MG tablet Take 1 tablet (500 mg total) by mouth daily.   aspirin 81 MG chewable tablet Chew 1 tablet (81 mg total) by mouth daily with breakfast.   carvedilol (COREG) 6.25 MG tablet Take 1 tablet (6.25 mg total) by mouth 2 (two) times daily.   chlorproMAZINE (THORAZINE) 50 MG tablet Take 1 tablet (50 mg total) by mouth at bedtime.   clobetasol (TEMOVATE) 0.05 % external solution Apply 1 Application topically 2 (two) times daily.   clonazePAM (KLONOPIN) 0.25 MG disintegrating tablet Take 1 tablet (0.25 mg total) by mouth 2 (two) times daily.   clopidogrel (PLAVIX) 75 MG tablet Take 1 tablet (75 mg total) by mouth daily.   Continuous Blood Gluc Sensor (DEXCOM G6 SENSOR) MISC 1 Piece by Does not apply route as needed.   Continuous Blood Gluc Transmit (DEXCOM G6 TRANSMITTER) MISC 1 Piece by Does not apply route as needed.   cyclobenzaprine (FLEXERIL) 10 MG tablet TAKE ONE TABLET BY MOUTH AT BEDTIME   donepezil (ARICEPT) 5 MG tablet Take 1 tablet (5 mg total) by mouth at bedtime.   Dulaglutide (TRULICITY) 9.67 EL/3.8BO SOPN Inject 0.75 mg into the skin once a week.   escitalopram (LEXAPRO) 20 MG tablet Take 1 tablet (20 mg total) by mouth daily.   Evolocumab (REPATHA SURECLICK) 175 MG/ML SOAJ Inject 1 Dose into the skin every 14 (fourteen) days.   ezetimibe (ZETIA) 10 MG tablet Take 1 tablet (10 mg total) by mouth daily.   icosapent Ethyl (VASCEPA) 1 g capsule Take 2 g by  mouth 2 (two) times daily.   insulin detemir (LEVEMIR) 100 UNIT/ML injection Inject 27 Units into the skin daily.   Insulin lispro (HUMALOG JUNIOR KWIKPEN) 100 UNIT/ML Inject 0-10 Units into the skin 3 (three) times daily. insulin Lispro (Humalog) injection 0-10 Units 0-10 Units Subcutaneous, 3 times daily with meals CBG < 70: Implement Hypoglycemia Standing Orders and refer to Hypoglycemia Standing Orders sidebar report  CBG 70 - 120: 0 unit CBG 121 - 150: 0 unit  CBG 151 - 200:  1 unit CBG 201 - 250: 2 units CBG 251 - 300: 4 units CBG 301 - 350: 6 units  CBG 351 - 400: 8 units  CBG > 400: 10 units   isosorbide mononitrate (IMDUR) 30 MG 24 hr tablet Take 1 tablet (30 mg total) by mouth daily.   isosorbide mononitrate (IMDUR) 60 MG 24 hr tablet Take 60 mg by mouth 2 (two) times daily.   lamoTRIgine (LAMICTAL) 200 MG tablet Take 1 tablet (200 mg total) by mouth at bedtime.   nitroGLYCERIN (NITROSTAT) 0.4 MG SL tablet Place 1 tablet (0.4 mg total) under the tongue every 5 (five) minutes as needed for chest pain.   Oxycodone HCl 10 MG TABS Take 1 tablet (10 mg total) by mouth daily as needed.   pantoprazole (PROTONIX) 40 MG tablet TAKE ONE TABLET BY MOUTH ONE TIME DAILY   pregabalin (LYRICA) 225 MG capsule TAKE ONE CAPSULE BY MOUTH TWICE A DAY   rosuvastatin (CRESTOR) 20 MG tablet TAKE ONE TABLET BY MOUTH ONE TIME DAILY   senna-docusate (SENOKOT-S) 8.6-50 MG tablet Take 2 tablets by mouth at bedtime.   triamcinolone cream (KENALOG) 0.1 % Apply 1 Application topically 2 (two) times daily.   ULTICARE MINI PEN NEEDLES 31G X 6 MM MISC USE PEN NEEDLES TWO TIMES DAILY   vitamin B-12 1000 MCG tablet Take 1 tablet (1,000 mcg total) by mouth daily.   Vitamin D, Ergocalciferol, (DRISDOL) 1.25 MG (50000 UNIT) CAPS capsule TAKE ONE CAPSULE BY MOUTH EVERY WEEK   Immunization History  Administered Date(s) Administered   Influenza Split 09/29/2012   Influenza,inj,Quad PF,6+ Mos 10/17/2015, 09/03/2016, 09/17/2017,  09/24/2018, 09/07/2019, 03/01/2022   Pneumococcal Polysaccharide-23 12/26/2017   Td 07/26/2010   Tdap 03/01/2022       Objective:   Physical Exam BP 134/86 (BP Location: Left Arm, Cuff Size: Normal)   Pulse 89   Temp 98.3 F (36.8 C)   Ht '5\' 7"'$  (1.702 m)   SpO2 94%   BMI 35.77 kg/m  GENERAL: Obese woman, awake and alert, presents in wheelchair.  Left hemiparesis noted.  No conversational dyspnea. HEAD: Normocephalic, atraumatic.  EYES: Pupils equal, round, reactive to light.  No scleral icterus.  MOUTH: Class III airway.  Oral mucosa moist NECK: Supple. No thyromegaly. Trachea midline. No JVD.  No adenopathy. PULMONARY: Good air entry bilaterally.  No adventitious sounds. CARDIOVASCULAR: S1 and S2. Regular rate and rhythm.  No rubs, murmurs or gallops heard. ABDOMEN: Obese, otherwise benign. MUSCULOSKELETAL: No joint deformity, no clubbing, no edema.  NEUROLOGIC: Left hemiparesis, no dysarthria or aphasia.  Awake and alert, mild memory impairment. SKIN: Intact,warm,dry. PSYCH: Mood and behavior normal.     Assessment & Plan:     ICD-10-CM   1. Hypoxia  R09.02 Pulse oximetry, overnight    CANCELED: Pulse oximetry, overnight   Suspect that this is due to central hypoventilation This may be related to her prior pontine stroke Will obtain overnight oximetry    2. Complex sleep apnea syndrome  G47.31 Pulse oximetry, overnight    CANCELED: Pulse oximetry, overnight   Appears to have some obstructive as well as central sleep apnea Unable to tolerate CPAP We will have sleep medicine evaluate Appointment with Dr. Elsworth Soho 11 Oct    3. Right pontine stroke (HCC)  I63.50 Pulse oximetry, overnight    CANCELED: Pulse oximetry, overnight   This issue adds complexity to her management Suspect central hypoventilation due to this     Orders Placed This Encounter  Procedures  Pulse oximetry, overnight    [facility] [employer]    Standing Status:   Future    Standing Expiration Date:    09/26/2023    Scheduling Instructions:     Urgent    We will check an overnight oximetry.  We are going to arrange for evaluation by sleep medicine with Dr. Elsworth Soho on 11 October at 8:30 in the morning.  She may very well need AVAPS versus trilogy vent at nighttime.  We will see her in follow-up to be determined after evaluation by Dr. Elsworth Soho.   Renold Don, MD [facility]    *This note was dictated using voice recognition software/Dragon.  Despite best efforts to proofread, errors can occur which can change the meaning. Any transcriptional errors that result from this process are unintentional and may not be fully corrected at the time of dictation.

## 2022-09-25 NOTE — Patient Instructions (Addendum)
We are going to check an overnight oximetry.  This will be done through [employer].  I we will have you be evaluated by one of our sleep doctors in [place].  It would be good for you to see them there and then we can arrange for follow-up here.  I suspect you are going to need a device called AVAPS due to the fact that your sleep apnea is complex.  Lets hold off on the CPAP for now.  You are not tolerating it well.   The appointment made for you was on October 11 at 8:30 in the morning with Dr. Elsworth Soho.

## 2022-09-28 ENCOUNTER — Encounter: Payer: Self-pay | Admitting: Pulmonary Disease

## 2022-10-02 ENCOUNTER — Encounter: Payer: Self-pay | Admitting: Pulmonary Disease

## 2022-10-03 ENCOUNTER — Ambulatory Visit (INDEPENDENT_AMBULATORY_CARE_PROVIDER_SITE_OTHER): Payer: BC Managed Care – PPO | Admitting: Pulmonary Disease

## 2022-10-03 ENCOUNTER — Encounter: Payer: Self-pay | Admitting: Pulmonary Disease

## 2022-10-03 DIAGNOSIS — I10 Essential (primary) hypertension: Secondary | ICD-10-CM

## 2022-10-03 DIAGNOSIS — G4733 Obstructive sleep apnea (adult) (pediatric): Secondary | ICD-10-CM | POA: Insufficient documentation

## 2022-10-03 DIAGNOSIS — G4731 Primary central sleep apnea: Secondary | ICD-10-CM

## 2022-10-03 DIAGNOSIS — G4739 Other sleep apnea: Secondary | ICD-10-CM

## 2022-10-03 DIAGNOSIS — J9611 Chronic respiratory failure with hypoxia: Secondary | ICD-10-CM

## 2022-10-03 HISTORY — DX: Other sleep apnea: G47.39

## 2022-10-03 NOTE — Assessment & Plan Note (Signed)
Central apneas are few less than 5/hour so below threshold of significance.  This may be related to her pontine stroke or could also be related to sedating medications she is on such as oxycodone, clonazepam and Thorazine.  Main issue is mild obstructive sleep apnea.  We discussed the cardiovascular implications of such mild sleep apnea is minimal.  Unfortunately she has not tolerated CPAP and during the titration study central apneas seem to emerge and persisted She is not a candidate for oral appliance since she is edentulous.  Not significant enough to put her through hypoglossal nerve stimulation therapy. Best way forward here may be to just use nocturnal oxygen and see how she does

## 2022-10-03 NOTE — Assessment & Plan Note (Signed)
Blood pressure is low today.  She is asymptomatic sitting in the wheelchair. I have asked her to decrease Coreg to 3.125 mg twice daily. She will record her blood pressure today evening and tomorrow again to make sure that systolics more than 583 before she takes the next dose

## 2022-10-03 NOTE — Assessment & Plan Note (Signed)
Oxygen saturation is low today, no evidence of bronchospasm. Chest x-ray from 01/2022 was reviewed which does not show significant hyperinflation. Spirometry and from the past did not show significant airway obstruction.  She does not have significant pulm hypertension on recent echo We will repeat PFTs given her long history of smoking We will provide her with oxygen to use during sleep and daytime as needed if saturations drop less than 88%.  She has a pulse oximeter at home

## 2022-10-03 NOTE — Addendum Note (Signed)
Addended by: Vanessa Barbara on: 10/03/2022 12:18 PM   Modules accepted: Orders

## 2022-10-03 NOTE — Progress Notes (Signed)
Subjective:    Patient ID: Erika [name], female    DOB: Apr 15, 1971, 51 y.o.   MRN: [id number]  HPI 51 year old ex-smoker referred for evaluation of complex sleep apnea.  She had initially sought evaluation due to witnessed apneas by her husband and loud snoring.  She admits to weight gain from 182 to 225 pounds within a year after her stroke. Epworth sleepiness score is 12 Bedtime around 11 PM, she wakes up around 4 AM when her husband goes to work and finally gets out of bed in a.m. feeling tired without dryness of mouth but reports headaches There is no history suggestive of cataplexy, sleep paralysis or parasomnias   She arrives in a wheelchair today accompanied by her mother and daughter, blood pressure is 86/68 oxygen saturation is 90 percent on room air Reviewed her sleep studies Medication review shows clonazepam for anxiety, Thorazine for sleep, amitriptyline for depression and oxycodone for cluster headaches. She has previously used oxygen for headaches in the past  PMH -  [place] stroke with residual left hemiparesis 06/2021 CABG 07/2016 Diabetes type 2, insulin requiring Peripheral arterial disease Severe anxiety on clonazepam  Significant tests/ events reviewed 07/11/22 Baseline study showed few central apneas 4/hour and total AHI 12/hour., low sat of 82%    07/2022 CPAP was titrated 5 to 13 cm, central apneas emerged and persisted  Echo 2/203 nml LVEF  Past Medical History:  Diagnosis Date  . Acute respiratory failure with hypoxia ([place]) 06/24/2021  . Arthralgia of temporomandibular joint   . CAD, multiple vessel    a. 06/2016 Cath: ostLM 40%, ostLAD 40%, pLAD 95%, ost-pLCx 60%, pLCx 95%, mLCx 60%, mRCA 95%, D2 50%, LVSF nl;  b. 07/2016 CABG x 4 (LIMA->LAD, VG->Diag, VG->OM, VG->RCA); c. 08/2016 Cath: 3VD w/ 4/4 patent grafts. LAD distal to LIMA has diff dzs->Med rx; d. 08/2020 Cath: 4/4 patent grafts, native 3VD. EF 55-65%-->Med Rx.  . Carotid arterial disease  ([place])    a. 07/2016 s/p R CEA; b. 02/2021 U/S: RICA 40-10%, LICA 2-72%.  . Cerebrovascular disease   . Clotting disorder ([place])   . Depression   . Diastolic dysfunction    a. 06/2016 Echo: EF 50-55%, mild inf wall HK, GR1DD, mild MR, RV sys fxn nl, mildly dilated LA, PASP nl; b. 06/2021 Echo: EF 60-65%, no rwma. Nl RV fxn.  . Fatty liver disease, nonalcoholic 5366  . History of blood transfusion    with heart surgery  . HLD (hyperlipidemia)   . Labile hypertension    a. prior renal ngiogram negative for RAS in 03/2016; b. catecholamines and metanephrines normal, mildly elevated renin with normal aldosterone and normal ratio in 02/2016  . Myocardial infarction ([place]) 2017  . Obesity   . PAD (peripheral artery disease) ([place])    a. 09/2018 s/p L SFA stenting; b. 07/2019 Periph Angio: Patent m/d L SFA stent w/ 100% L SFA distal to stent. L AT 100d, L Peroneal diff dzs-->Med Rx; c. 02/2021 ABIs: stable @ 0.61 on R and 0.46 on L.  Marland Kitchen PTSD (post-traumatic stress disorder)   . Tobacco abuse   . Type 2 diabetes mellitus ([place]) 12/2015   Past Surgical History:  Procedure Laterality Date  . ABDOMINAL AORTOGRAM W/LOWER EXTREMITY [name] 10/15/2018   Procedure: ABDOMINAL AORTOGRAM W/LOWER EXTREMITY;  Surgeon: Wellington Hampshire, MD;  Location: [facility];  Service: Cardiovascular;  Laterality: [name];  . ABDOMINAL AORTOGRAM W/LOWER EXTREMITY Bilateral 08/19/2019   Procedure: ABDOMINAL AORTOGRAM W/LOWER EXTREMITY;  Surgeon:  Wellington Hampshire, MD;  Location: [facility];  Service: Cardiovascular;  Laterality: Bilateral;  . CARDIAC CATHETERIZATION [name] 06/29/2016   Procedure: Left Heart Cath and Coronary Angiography;  Surgeon: Minna Merritts, MD;  Location: [facility];  Service: Cardiovascular;  Laterality: [name];  . CARDIAC CATHETERIZATION [name] 08/29/2016   Procedure: Left Heart Cath and Cors/Grafts Angiography;  Surgeon: Wellington Hampshire, MD;  Location: [facility];  Service: Cardiovascular;   Laterality: [name];  . CESAREAN SECTION    . CHOLECYSTECTOMY    . CORONARY ARTERY BYPASS GRAFT [name] 07/06/2016   Procedure: CORONARY ARTERY BYPASS GRAFTING (CABG) x four, using left internal mammary artery and right leg greater saphenous vein harvested endoscopically;  Surgeon: Ivin Poot, MD;  Location: [facility];  Service: Open Heart Surgery;  Laterality: [name];  . ENDARTERECTOMY Right 07/06/2016   Procedure: ENDARTERECTOMY CAROTID;  Surgeon: Rosetta Posner, MD;  Location: [facility];  Service: Vascular;  Laterality: Right;  . ENDARTERECTOMY Right 04/27/2020   Procedure: REDO OF RIGHT ENDARTERECTOMY CAROTID;  Surgeon: Rosetta Posner, MD;  Location: [facility];  Service: Vascular;  Laterality: Right;  . LEFT HEART CATH AND CORS/GRAFTS ANGIOGRAPHY [name] 08/24/2020   Procedure: LEFT HEART CATH AND CORS/GRAFTS ANGIOGRAPHY;  Surgeon: Wellington Hampshire, MD;  Location: [name];  Service: Cardiovascular;  Laterality: [name];  . LEFT HEART CATH AND CORS/GRAFTS ANGIOGRAPHY [name] 01/29/2022   Procedure: LEFT HEART CATH AND CORS/GRAFTS ANGIOGRAPHY;  Surgeon: Wellington Hampshire, MD;  Location: [facility];  Service: Cardiovascular;  Laterality: [name];  . PERIPHERAL VASCULAR CATHETERIZATION [name] 04/18/2016   Procedure: Renal Angiography;  Surgeon: Wellington Hampshire, MD;  Location: [facility];  Service: Cardiovascular;  Laterality: [name];  . PERIPHERAL VASCULAR INTERVENTION Left 10/15/2018   Procedure: PERIPHERAL VASCULAR INTERVENTION;  Surgeon: Wellington Hampshire, MD;  Location: [facility];  Service: Cardiovascular;  Laterality: Left;  Left superficial femoral  . TEE WITHOUT CARDIOVERSION [name] 07/06/2016   Procedure: TRANSESOPHAGEAL ECHOCARDIOGRAM (TEE);  Surgeon: Ivin Poot, MD;  Location: [facility];  Service: Open Heart Surgery;  Laterality: [name];  . TONSILLECTOMY      Allergies  Allergen Reactions  . Chantix [Varenicline Tartrate] Other (See Comments)    Feels "crazy" and angry     Social History    Socioeconomic History  . Marital status: Married    Spouse name: Not on file  . Number of children: 1  . Years of education: 33  . Highest education level: Not on file  Occupational History  . Occupation: Disability  Tobacco Use  . Smoking status: Former    Packs/day: 0.50    Years: 27.00    Total pack years: 13.50    Types: Cigarettes    Quit date: 2022    Years since quitting: 1.7  . Smokeless tobacco: Never  Vaping Use  . Vaping Use: Every day  . Substances: Flavoring  Substance and Sexual Activity  . Alcohol use: Not Currently    Alcohol/week: 0.0 standard drinks of alcohol    Comment: socially  . Drug use: No  . Sexual activity: Yes    Partners: Male    Birth control/protection: None  Other Topics Concern  . Not on file  Social History Narrative   11/21/20   From: [name]   Living: with husband, Catalina Antigua (2009) and special needs daughter    Work: disability       Family: daughter - [name] (  special needs, lives with her) and 2 grown step children - [name] and [name]       Enjoys: play with dogs - 2 german Shepard, mut, and blue tick walker mix      Exercise: PAD limits exercise   Diet: diabetic diet and low potassium due to daughter      Safety   Seat belts: Yes    Guns: Yes  and secure   Safe in relationships: Yes    Social Determinants of Health   Financial Resource Strain: Not on file  Food Insecurity: Not on file  Transportation Needs: Not on file  Physical Activity: Not on file  Stress: Not on file  Social Connections: Not on file  Intimate Partner Violence: Not on file    Family History  Adopted: Yes  Problem Relation Age of Onset  . Diabetes Mother   . Diabetes Father   . Alcohol abuse Father   . Heart disease Father   . Drug abuse Father   . Stroke Sister   . Anxiety disorder Sister       Review of Systems Constitutional: negative for anorexia, fevers and sweats  Eyes: negative for irritation, redness and visual  disturbance  Ears, nose, mouth, throat, and face: negative for earaches, epistaxis, nasal congestion and sore throat  Respiratory: negative for cough, sputum and wheezing  Cardiovascular: negative for chest pain, lower extremity edema, orthopnea, palpitations and syncope  Gastrointestinal: negative for abdominal pain, constipation, diarrhea, melena, nausea and vomiting  Genitourinary:negative for dysuria, frequency and hematuria  Hematologic/lymphatic: negative for bleeding, easy bruising and lymphadenopathy  Musculoskeletal:negative for arthralgias, muscle weakness and stiff joints  Neurological: negative for coordination problems, gait problems,and weakness  Endocrine: negative for diabetic symptoms including polydipsia, polyuria and weight loss     Objective:   Physical Exam  Gen. Pleasant, obese, in no distress, normal affect ENT - no pallor,icterus, no post nasal drip, class 2 airway Neck: No JVD, no thyromegaly, no carotid bruits Lungs: no use of accessory muscles, no dullness to percussion, decreased without rales or rhonchi  Cardiovascular: Rhythm regular, heart sounds  normal, no murmurs or gallops, no peripheral edema Abdomen: soft and non-tender, no hepatosplenomegaly, BS normal. Musculoskeletal: No deformities, no cyanosis or clubbing Neuro:  alert, hemiparesis, wheelchair ,no tremors       Assessment & Plan:

## 2022-10-03 NOTE — Patient Instructions (Signed)
Your blood pressure is low today.  Decrease Coreg to 3.125 mg twice daily.  Goal systolic blood pressure should be more than 100  X ambulatory saturation.  We will provide you oxygen.  Try to sleep with this every night   x schedule PFTs at [facility]

## 2022-10-04 ENCOUNTER — Other Ambulatory Visit: Payer: Self-pay | Admitting: Cardiovascular Disease

## 2022-10-04 ENCOUNTER — Other Ambulatory Visit (HOSPITAL_COMMUNITY): Payer: Self-pay | Admitting: Psychiatry

## 2022-10-04 DIAGNOSIS — F4310 Post-traumatic stress disorder, unspecified: Secondary | ICD-10-CM

## 2022-10-04 DIAGNOSIS — G4733 Obstructive sleep apnea (adult) (pediatric): Secondary | ICD-10-CM | POA: Diagnosis not present

## 2022-10-04 DIAGNOSIS — F331 Major depressive disorder, recurrent, moderate: Secondary | ICD-10-CM

## 2022-10-07 DIAGNOSIS — G4733 Obstructive sleep apnea (adult) (pediatric): Secondary | ICD-10-CM | POA: Diagnosis not present

## 2022-10-09 ENCOUNTER — Encounter: Payer: Self-pay | Admitting: Physical Medicine and Rehabilitation

## 2022-10-09 ENCOUNTER — Encounter
Payer: BC Managed Care – PPO | Attending: Physical Medicine and Rehabilitation | Admitting: Physical Medicine and Rehabilitation

## 2022-10-09 VITALS — BP 118/76 | HR 92 | Temp 98.1°F | Ht 67.0 in | Wt 230.0 lb

## 2022-10-09 DIAGNOSIS — G43811 Other migraine, intractable, with status migrainosus: Secondary | ICD-10-CM | POA: Insufficient documentation

## 2022-10-09 DIAGNOSIS — G44029 Chronic cluster headache, not intractable: Secondary | ICD-10-CM | POA: Diagnosis not present

## 2022-10-09 MED ORDER — OXYCODONE HCL 10 MG PO TABS: 10.0000 mg | ORAL_TABLET | Freq: Every day | ORAL | 0 refills | Status: DC | PRN

## 2022-10-09 MED ORDER — CYCLOBENZAPRINE HCL 10 MG PO TABS: 10.0000 mg | ORAL_TABLET | Freq: Every day | ORAL | 0 refills | Status: DC

## 2022-10-09 MED ORDER — ONABOTULINUMTOXINA 100 UNITS IJ SOLR
200.0000 [IU] | Freq: Once | INTRAMUSCULAR | Status: AC
  Administered 2022-10-09: 200 [IU] via INTRAMUSCULAR

## 2022-10-09 NOTE — Progress Notes (Addendum)
Botox Injection for chronic migraine headaches   Dilution: 100 Units/ 93m preservative free NS x2 Indication: refractory headaches (At least 15 days per month/headache lasting greater than 4 hours per day) incompletely responsive to other more conservative measures.  Informed consent was obtained after describing risks and benefits of the procedure with the patient. This includes bleeding, bruising, infection, excessive weakness, or medication side effects. A REMS form is on file and signed. Needle: 30g 1/2 inch needle    Number of units per muscle:  Right temporalis 20 units, 4 access points Left temporalis 20 units,  4 access points Right frontalis 10 units, 2 access points Left frontalis 10 units, 2 access points Procerus 5 units, 1 access point Right corrugator 5 units, 1 access point Left corrugator 5 units, 1 access point Right occipitalis 15 units, 3 access points Left occipitalis 15 units, 3 access points Right cervical paraspinal 10 units, 2 access points Left cervical paraspinals 10 units, 2 access points  Right trapezius 15 units, 3 access points Left trapezius 15 units, 3 access points   Remaining 45 units was discarded    All injections were done after  after negative drawback for blood. The patient tolerated the procedure well. Post procedure instructions were given. A followup appointment was made.   Prescribed Zynex Nexwave heating/cooling blanket

## 2022-10-10 ENCOUNTER — Other Ambulatory Visit (HOSPITAL_COMMUNITY): Payer: Self-pay | Admitting: *Deleted

## 2022-10-10 DIAGNOSIS — F4310 Post-traumatic stress disorder, unspecified: Secondary | ICD-10-CM

## 2022-10-10 DIAGNOSIS — F331 Major depressive disorder, recurrent, moderate: Secondary | ICD-10-CM

## 2022-10-10 MED ORDER — CLONAZEPAM 0.25 MG PO TBDP: 0.2500 mg | ORAL_TABLET | Freq: Two times a day (BID) | ORAL | 0 refills | Status: DC

## 2022-10-11 DIAGNOSIS — G44009 Cluster headache syndrome, unspecified, not intractable: Secondary | ICD-10-CM | POA: Diagnosis not present

## 2022-10-11 DIAGNOSIS — G43909 Migraine, unspecified, not intractable, without status migrainosus: Secondary | ICD-10-CM | POA: Diagnosis not present

## 2022-10-11 DIAGNOSIS — S8252XD Displaced fracture of medial malleolus of left tibia, subsequent encounter for closed fracture with routine healing: Secondary | ICD-10-CM | POA: Diagnosis not present

## 2022-10-12 DIAGNOSIS — G43909 Migraine, unspecified, not intractable, without status migrainosus: Secondary | ICD-10-CM | POA: Diagnosis not present

## 2022-10-12 DIAGNOSIS — G44009 Cluster headache syndrome, unspecified, not intractable: Secondary | ICD-10-CM | POA: Diagnosis not present

## 2022-10-13 DIAGNOSIS — G44009 Cluster headache syndrome, unspecified, not intractable: Secondary | ICD-10-CM | POA: Diagnosis not present

## 2022-10-13 DIAGNOSIS — G43909 Migraine, unspecified, not intractable, without status migrainosus: Secondary | ICD-10-CM | POA: Diagnosis not present

## 2022-10-14 DIAGNOSIS — G44009 Cluster headache syndrome, unspecified, not intractable: Secondary | ICD-10-CM | POA: Diagnosis not present

## 2022-10-14 DIAGNOSIS — G43909 Migraine, unspecified, not intractable, without status migrainosus: Secondary | ICD-10-CM | POA: Diagnosis not present

## 2022-10-15 ENCOUNTER — Encounter (HOSPITAL_COMMUNITY): Payer: Self-pay | Admitting: Psychiatry

## 2022-10-15 ENCOUNTER — Telehealth (HOSPITAL_BASED_OUTPATIENT_CLINIC_OR_DEPARTMENT_OTHER): Payer: BC Managed Care – PPO | Admitting: Psychiatry

## 2022-10-15 ENCOUNTER — Telehealth: Payer: Self-pay

## 2022-10-15 DIAGNOSIS — G44029 Chronic cluster headache, not intractable: Secondary | ICD-10-CM

## 2022-10-15 DIAGNOSIS — G43909 Migraine, unspecified, not intractable, without status migrainosus: Secondary | ICD-10-CM | POA: Diagnosis not present

## 2022-10-15 DIAGNOSIS — F331 Major depressive disorder, recurrent, moderate: Secondary | ICD-10-CM

## 2022-10-15 DIAGNOSIS — F4310 Post-traumatic stress disorder, unspecified: Secondary | ICD-10-CM

## 2022-10-15 DIAGNOSIS — G44009 Cluster headache syndrome, unspecified, not intractable: Secondary | ICD-10-CM | POA: Diagnosis not present

## 2022-10-15 MED ORDER — CHLORPROMAZINE HCL 50 MG PO TABS: 50.0000 mg | ORAL_TABLET | Freq: Every day | ORAL | 2 refills | Status: DC

## 2022-10-15 MED ORDER — ESCITALOPRAM OXALATE 20 MG PO TABS: 20.0000 mg | ORAL_TABLET | Freq: Every day | ORAL | 2 refills | Status: DC

## 2022-10-15 MED ORDER — CLONAZEPAM 0.25 MG PO TBDP: 0.2500 mg | ORAL_TABLET | Freq: Two times a day (BID) | ORAL | 2 refills | Status: DC

## 2022-10-15 MED ORDER — LAMOTRIGINE 200 MG PO TABS: 200.0000 mg | ORAL_TABLET | Freq: Every day | ORAL | 2 refills | Status: DC

## 2022-10-15 NOTE — Telephone Encounter (Signed)
Pharmacy has question on dosing & quantity of Oxycodone 10 MG. Call back phone [facility] [phone]
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
## 2022-10-16 ENCOUNTER — Ambulatory Visit: Payer: BC Managed Care – PPO | Attending: Medical | Admitting: Medical

## 2022-10-16 ENCOUNTER — Encounter: Payer: Self-pay | Admitting: Medical

## 2022-10-16 ENCOUNTER — Other Ambulatory Visit: Payer: Self-pay | Admitting: Physical Medicine and Rehabilitation

## 2022-10-16 ENCOUNTER — Telehealth: Payer: Self-pay

## 2022-10-16 VITALS — BP 132/92 | HR 102 | Ht 67.0 in | Wt 223.4 lb

## 2022-10-16 DIAGNOSIS — Z951 Presence of aortocoronary bypass graft: Secondary | ICD-10-CM

## 2022-10-16 DIAGNOSIS — I10 Essential (primary) hypertension: Secondary | ICD-10-CM

## 2022-10-16 DIAGNOSIS — G44009 Cluster headache syndrome, unspecified, not intractable: Secondary | ICD-10-CM | POA: Diagnosis not present

## 2022-10-16 DIAGNOSIS — Z8673 Personal history of transient ischemic attack (TIA), and cerebral infarction without residual deficits: Secondary | ICD-10-CM

## 2022-10-16 DIAGNOSIS — I779 Disorder of arteries and arterioles, unspecified: Secondary | ICD-10-CM

## 2022-10-16 DIAGNOSIS — G44029 Chronic cluster headache, not intractable: Secondary | ICD-10-CM

## 2022-10-16 DIAGNOSIS — J9611 Chronic respiratory failure with hypoxia: Secondary | ICD-10-CM

## 2022-10-16 DIAGNOSIS — I739 Peripheral vascular disease, unspecified: Secondary | ICD-10-CM | POA: Diagnosis not present

## 2022-10-16 DIAGNOSIS — I2510 Atherosclerotic heart disease of native coronary artery without angina pectoris: Secondary | ICD-10-CM

## 2022-10-16 DIAGNOSIS — E782 Mixed hyperlipidemia: Secondary | ICD-10-CM

## 2022-10-16 DIAGNOSIS — G43909 Migraine, unspecified, not intractable, without status migrainosus: Secondary | ICD-10-CM | POA: Diagnosis not present

## 2022-10-16 MED ORDER — OXYCODONE HCL 10 MG PO TABS: 10.0000 mg | ORAL_TABLET | Freq: Two times a day (BID) | ORAL | 0 refills | Status: DC | PRN

## 2022-10-16 MED ORDER — METOPROLOL SUCCINATE ER 50 MG PO TB24: 50.0000 mg | ORAL_TABLET | Freq: Every day | ORAL | 3 refills | Status: DC

## 2022-10-16 NOTE — Progress Notes (Signed)
Cardiology Office Note:    Date:  10/16/2022   ID:  Erika [name], DOB April 08, 1971, MRN [id number]  PCP:  Waunita Schooner, MD  [facility] Cardiologist:  [name], MD  [facility] Electrophysiologist:  None   Referring MD: Waunita Schooner, MD   Chief Complaint: 6 month follow-up   History of Present Illness:    Erika [name] is a 51 y.o. female with a hx of CAD/CABG x 4 (2017), right CEA, PAD(left SFA stent), hypertension, PAD s/p left SFA stent, hyperlipidemia, CVA who is being seen for 13-monthfollow-up.   She has known history of coronary artery disease with previous non-ST elevation myocardial infarction in 2017.  She was found to have three-vessel coronary artery disease at that time and underwent CABG and right carotid endarterectomy.  She was rehospitalized in September, 2017 with chest pain in the setting of uncontrolled hypertension. Cardiac catheterization showed patent grafts . The LAD had diffuse disease distal to the anastomosis and was very small in caliber. Ejection fraction was normal with mildly elevated left ventricular end-diastolic pressure. She underwent repeat cardiac catheterization in September 2021 due to worsening angina.  Cardiac cath showed progression of native coronary artery disease but patent grafts with no obstructive disease.  Medical therapy was recommended.     Hospitalized in July 2022 for acute stroke and elevated troponin. Echo showed normal LV function with no significant valvular abnormality.    Admitted early February for AMS found to have NSTEMI and elevated troponin. Cath showed patent grafts and otherwise stable anatomy. Echo showed preserved EF. She was treated with IV heparin. CT/MRI head without acute findings. Suspected AMS multifactorial due to medications, hypotension. She was discharged to SNF.   Last seen 04/19/2022 and was overall doing well from a cardiac perspective. Carotid Doppler was ordered.  Carotid Doppler  showed minimal plaque plan to repeat study in 1 year  Today, the patient reports pulmonology started her on 2 L of oxygen. She uses this at all times. She reports Coreg was previously decreased due to low blood pressure. Heart rate today is 102 bpm. Patient is also on Imdur.  Blood pressure is 132/92. Patient denies chest pain, lower leg edema, orthopnea, PND. Breathing is stable with supplemental O2.  Prior ABIs and carotid Dopplers were reviewed.  Past Medical History:  Diagnosis Date   Acute respiratory failure with hypoxia ([place] 06/24/2021   Arthralgia of temporomandibular joint    CAD, multiple vessel    a. 06/2016 Cath: ostLM 40%, ostLAD 40%, pLAD 95%, ost-pLCx 60%, pLCx 95%, mLCx 60%, mRCA 95%, D2 50%, LVSF nl;  b. 07/2016 CABG x 4 (LIMA->LAD, VG->Diag, VG->OM, VG->RCA); c. 08/2016 Cath: 3VD w/ 4/4 patent grafts. LAD distal to LIMA has diff dzs->Med rx; d. 08/2020 Cath: 4/4 patent grafts, native 3VD. EF 55-65%-->Med Rx.   Carotid arterial disease ([place]    a. 07/2016 s/p R CEA; b. 02/2021 U/S: RICA 464-40% LICA 13-47%   Cerebrovascular disease    Clotting disorder ([place]    Complex sleep apnea syndrome 10/03/2022   Depression    Diastolic dysfunction    a. 06/2016 Echo: EF 50-55%, mild inf wall HK, GR1DD, mild MR, RV sys fxn nl, mildly dilated LA, PASP nl; b. 06/2021 Echo: EF 60-65%, no rwma. Nl RV fxn.   Fatty liver disease, nonalcoholic 24259  History of blood transfusion    with heart surgery   HLD (hyperlipidemia)    Labile hypertension    a. prior renal ngiogram negative  for RAS in 03/2016; b. catecholamines and metanephrines normal, mildly elevated renin with normal aldosterone and normal ratio in 02/2016   Myocardial infarction [facility]) 2017   Obesity    PAD (peripheral artery disease) ([place])    a. 09/2018 s/p L SFA stenting; b. 07/2019 Periph Angio: Patent m/d L SFA stent w/ 100% L SFA distal to stent. L AT 100d, L Peroneal diff dzs-->Med Rx; c. 02/2021 ABIs: stable @ 0.61 on R and 0.46 on L.    PTSD (post-traumatic stress disorder)    Tobacco abuse    Type 2 diabetes mellitus ([place]) 12/2015    Past Surgical History:  Procedure Laterality Date   ABDOMINAL AORTOGRAM W/LOWER EXTREMITY [name] 10/15/2018   Procedure: ABDOMINAL AORTOGRAM W/LOWER EXTREMITY;  Surgeon: Wellington Hampshire, MD;  Location: [place] [facility];  Service: Cardiovascular;  Laterality: [name];   ABDOMINAL AORTOGRAM W/LOWER EXTREMITY Bilateral 08/19/2019   Procedure: ABDOMINAL AORTOGRAM W/LOWER EXTREMITY;  Surgeon: Wellington Hampshire, MD;  Location: [place] [facility];  Service: Cardiovascular;  Laterality: Bilateral;   CARDIAC CATHETERIZATION [name] 06/29/2016   Procedure: Left Heart Cath and Coronary Angiography;  Surgeon: Minna Merritts, MD;  Location: [place] [facility];  Service: Cardiovascular;  Laterality: [name];   CARDIAC CATHETERIZATION [name] 08/29/2016   Procedure: Left Heart Cath and Cors/Grafts Angiography;  Surgeon: Wellington Hampshire, MD;  Location: [place] [facility];  Service: Cardiovascular;  Laterality: [name];   CESAREAN SECTION     CHOLECYSTECTOMY     CORONARY ARTERY BYPASS GRAFT [name] 07/06/2016   Procedure: CORONARY ARTERY BYPASS GRAFTING (CABG) x four, using left internal mammary artery and right leg greater saphenous vein harvested endoscopically;  Surgeon: Ivin Poot, MD;  Location: [place];  Service: Open Heart Surgery;  Laterality: [name];   ENDARTERECTOMY Right 07/06/2016   Procedure: ENDARTERECTOMY CAROTID;  Surgeon: Rosetta Posner, MD;  Location: [facility] OR;  Service: Vascular;  Laterality: Right;   ENDARTERECTOMY Right 04/27/2020   Procedure: REDO OF RIGHT ENDARTERECTOMY CAROTID;  Surgeon: Rosetta Posner, MD;  Location: [facility] OR;  Service: Vascular;  Laterality: Right;   LEFT HEART CATH AND CORS/GRAFTS ANGIOGRAPHY [name] 08/24/2020   Procedure: LEFT HEART CATH AND CORS/GRAFTS ANGIOGRAPHY;  Surgeon: Wellington Hampshire, MD;  Location: [place] [facility];  Service: Cardiovascular;  Laterality: [name];   LEFT HEART CATH AND  CORS/GRAFTS ANGIOGRAPHY [name] 01/29/2022   Procedure: LEFT HEART CATH AND CORS/GRAFTS ANGIOGRAPHY;  Surgeon: Wellington Hampshire, MD;  Location: [place] [facility];  Service: Cardiovascular;  Laterality: [name];   PERIPHERAL VASCULAR CATHETERIZATION [name] 04/18/2016   Procedure: Renal Angiography;  Surgeon: Wellington Hampshire, MD;  Location: [place] [facility];  Service: Cardiovascular;  Laterality: [name];   PERIPHERAL VASCULAR INTERVENTION Left 10/15/2018   Procedure: PERIPHERAL VASCULAR INTERVENTION;  Surgeon: Wellington Hampshire, MD;  Location: [place] [facility];  Service: Cardiovascular;  Laterality: Left;  Left superficial femoral   TEE WITHOUT CARDIOVERSION [name] 07/06/2016   Procedure: TRANSESOPHAGEAL ECHOCARDIOGRAM (TEE);  Surgeon: Ivin Poot, MD;  Location: [place];  Service: Open Heart Surgery;  Laterality: [name];   TONSILLECTOMY      Current Medications: Current Meds  Medication Sig   acetaminophen (TYLENOL) 325 MG tablet Take 1-2 tablets (325-650 mg total) by mouth every 4 (four) hours as needed for mild pain.   albuterol (VENTOLIN HFA) 108 (90 Base) MCG/ACT inhaler Inhale 2 puffs into the lungs every 6 (six) hours as needed for wheezing.   ascorbic acid (VITAMIN C)  500 MG tablet Take 1 tablet (500 mg total) by mouth daily.   aspirin 81 MG chewable tablet Chew 1 tablet (81 mg total) by mouth daily with breakfast.   chlorproMAZINE (THORAZINE) 50 MG tablet Take 1 tablet (50 mg total) by mouth at bedtime.   clobetasol (TEMOVATE) 0.05 % external solution Apply 1 Application topically 2 (two) times daily.   clonazePAM (KLONOPIN) 0.25 MG disintegrating tablet Take 1 tablet (0.25 mg total) by mouth 2 (two) times daily.   clopidogrel (PLAVIX) 75 MG tablet Take 1 tablet (75 mg total) by mouth daily.   Continuous Blood Gluc Sensor (DEXCOM G6 SENSOR) MISC 1 Piece by Does not apply route as needed.   Continuous Blood Gluc Transmit (DEXCOM G6 TRANSMITTER) MISC 1 Piece by Does not apply route as needed.    cyclobenzaprine (FLEXERIL) 10 MG tablet Take 1 tablet (10 mg total) by mouth at bedtime.   donepezil (ARICEPT) 5 MG tablet Take 1 tablet (5 mg total) by mouth at bedtime.   Dulaglutide (TRULICITY) 9.24 QA/8.3MH SOPN Inject 0.75 mg into the skin once a week.   escitalopram (LEXAPRO) 20 MG tablet Take 1 tablet (20 mg total) by mouth daily.   Evolocumab (REPATHA SURECLICK) 962 MG/ML SOAJ Inject 1 Dose into the skin every 14 (fourteen) days.   ezetimibe (ZETIA) 10 MG tablet Take 1 tablet (10 mg total) by mouth daily.   icosapent Ethyl (VASCEPA) 1 g capsule Take 2 g by mouth 2 (two) times daily.   insulin detemir (LEVEMIR) 100 UNIT/ML injection Inject 27 Units into the skin daily.   Insulin lispro (HUMALOG JUNIOR KWIKPEN) 100 UNIT/ML Inject 0-10 Units into the skin 3 (three) times daily. insulin Lispro (Humalog) injection 0-10 Units 0-10 Units Subcutaneous, 3 times daily with meals CBG < 70: Implement Hypoglycemia Standing Orders and refer to Hypoglycemia Standing Orders sidebar report  CBG 70 - 120: 0 unit CBG 121 - 150: 0 unit  CBG 151 - 200: 1 unit CBG 201 - 250: 2 units CBG 251 - 300: 4 units CBG 301 - 350: 6 units  CBG 351 - 400: 8 units  CBG > 400: 10 units   isosorbide mononitrate (IMDUR) 30 MG 24 hr tablet Take 1 tablet (30 mg total) by mouth daily.   lamoTRIgine (LAMICTAL) 200 MG tablet Take 1 tablet (200 mg total) by mouth at bedtime.   metoprolol succinate (TOPROL-XL) 50 MG 24 hr tablet Take 1 tablet (50 mg total) by mouth daily. Take with or immediately following a meal.   nitroGLYCERIN (NITROSTAT) 0.4 MG SL tablet Place 1 tablet (0.4 mg total) under the tongue every 5 (five) minutes as needed for chest pain.   Oxycodone HCl 10 MG TABS Take 1 tablet (10 mg total) by mouth daily as needed.   pantoprazole (PROTONIX) 40 MG tablet TAKE ONE TABLET BY MOUTH ONE TIME DAILY   pregabalin (LYRICA) 225 MG capsule TAKE ONE CAPSULE BY MOUTH TWICE A DAY   rosuvastatin (CRESTOR) 20 MG tablet Take 1 tablet  (20 mg total) by mouth daily.   senna-docusate (SENOKOT-S) 8.6-50 MG tablet Take 2 tablets by mouth at bedtime.   triamcinolone cream (KENALOG) 0.1 % Apply 1 Application topically 2 (two) times daily.   ULTICARE MINI PEN NEEDLES 31G X 6 MM MISC USE PEN NEEDLES TWO TIMES DAILY   vitamin B-12 1000 MCG tablet Take 1 tablet (1,000 mcg total) by mouth daily.   Vitamin D, Ergocalciferol, (DRISDOL) 1.25 MG (50000 UNIT) CAPS capsule TAKE ONE CAPSULE BY  MOUTH EVERY WEEK   [DISCONTINUED] carvedilol (COREG) 6.25 MG tablet Take 1 tablet (6.25 mg total) by mouth 2 (two) times daily. (Patient taking differently: Take 6.25 mg by mouth 2 (two) times daily. Half tablet twice daily)     Allergies:   Chantix [varenicline tartrate]   Social History   Socioeconomic History   Marital status: Married    Spouse name: Not on file   Number of children: 1   Years of education: 14   Highest education level: Not on file  Occupational History   Occupation: Disability  Tobacco Use   Smoking status: Former    Packs/day: 0.50    Years: 27.00    Total pack years: 13.50    Types: Cigarettes    Quit date: 2022    Years since quitting: 1.8   Smokeless tobacco: Never  Vaping Use   Vaping Use: Every day   Substances: Flavoring  Substance and Sexual Activity   Alcohol use: Not Currently    Alcohol/week: 0.0 standard drinks of alcohol    Comment: socially   Drug use: No   Sexual activity: Yes    Partners: Male    Birth control/protection: None  Other Topics Concern   Not on file  Social History Narrative   11/21/20   From: [name]   Living: with husband, [name] (2009) and special needs daughter    Work: disability       Family: daughter - [name] (special needs, lives with her) and 2 grown step children - [name] and [name]       Enjoys: play with dogs - 2 german Shepard, mut, and blue tick walker mix      Exercise: PAD limits exercise   Diet: diabetic diet and low potassium due to daughter       Safety   Seat belts: Yes    Guns: Yes  and secure   Safe in relationships: Yes    Social Determinants of Health   Financial Resource Strain: Not on file  Food Insecurity: Not on file  Transportation Needs: Not on file  Physical Activity: Not on file  Stress: Not on file  Social Connections: Not on file     Family History: The patient's family history includes Alcohol abuse in her father; Anxiety disorder in her sister; Diabetes in her father and mother; Drug abuse in her father; Heart disease in her father; Stroke in her sister. She was adopted.  ROS:   Please see the history of present illness.     All other systems reviewed and are negative.  EKGs/Labs/Other Studies Reviewed:    The following studies were reviewed today:  LHC 01/29/22   Ost LAD to Prox LAD lesion is 85% stenosed.   Mid LAD lesion is 100% stenosed.   Dist LAD-1 lesion is 60% stenosed.   Dist LAD-2 lesion is 40% stenosed.   Ost Cx to Prox Cx lesion is 100% stenosed.   Prox RCA lesion is 95% stenosed.   Mid RCA lesion is 100% stenosed.   2nd Diag lesion is 95% stenosed.   Origin to Prox Graft lesion is 40% stenosed.   SVG and is normal in caliber.   SVG and is normal in caliber.   LIMA and is normal in caliber.   SVG and is normal in caliber.   The graft exhibits no disease.   The graft exhibits no disease.   The graft exhibits no disease.   The graft exhibits no disease.   1.  Severe underlying three-vessel coronary artery disease with patent grafts including LIMA to LAD, SVG to diagonal/OM and SVG to right PDA.  Moderate mid to distal LAD disease post LIMA anastomosis. No significant change in coronary anatomy since most recent cardiac catheterization. 2.  Left ventricular angiography was not performed.  EF was normal by echo. 3.  Mildly elevated left ventricular end-diastolic pressure of 20 mmHg.   Recommendations: Elevated troponin is likely due to supply demand ischemia.  No culprit is  identified.  Recommend continuing medical therapy.     Coronary Diagrams   Diagnostic Dominance: Right     TTE 01/27/2022 1. Left ventricular ejection fraction, by estimation, is 60 to 65%. The  left ventricle has normal function. The left ventricle has no regional  wall motion abnormalities. There is mild left ventricular hypertrophy.  Left ventricular diastolic parameters  are consistent with Grade II diastolic dysfunction (pseudonormalization).   2. Right ventricular systolic function is normal. The right ventricular  size is normal.   3. The mitral valve is normal in structure. Mild mitral valve  regurgitation.   4. The aortic valve is tricuspid. Aortic valve regurgitation is not  visualized.   5. The inferior vena cava is dilated in size with <50% respiratory  variability, suggesting right atrial pressure of 15 mmHg.   EKG:  EKG is ordered today.  The ekg ordered today demonstrates NSR 102bpm, nonspecific ST changes  Recent Labs: 12/24/2021: Magnesium 2.1 01/26/2022: B Natriuretic Peptide 351.5 01/27/2022: BUN 11; Potassium 3.3; Sodium 138 01/29/2022: Creatinine, Ser 0.70 02/01/2022: Hemoglobin 11.0; Platelets 309 05/25/2022: ALT 30  Recent Lipid Panel    Component Value Date/Time   CHOL 93 05/25/2022 1032   CHOL 121 08/21/2016 0817   TRIG 182.0 (H) 05/25/2022 1032   HDL 41.80 05/25/2022 1032   HDL 24 (L) 08/21/2016 0817   CHOLHDL 2 05/25/2022 1032   VLDL 36.4 05/25/2022 1032   LDLCALC 15 05/25/2022 1032   LDLCALC 64 08/21/2016 0817   LDLDIRECT 99.6 (H) 06/25/2021 0740    Physical Exam:    VS:  BP (!) 132/92 (BP Location: Right Arm, Patient Position: Sitting, Cuff Size: Normal)   Pulse (!) 102   Ht '5\' 7"'$  (1.702 m)   Wt 223 lb 6.4 oz (101.3 kg)   SpO2 92% Comment: 2 liters home  BMI 34.99 kg/m     Wt Readings from Last 3 Encounters:  10/16/22 223 lb 6.4 oz (101.3 kg)  10/09/22 230 lb (104.3 kg)  10/03/22 228 lb 9.6 oz (103.7 kg)     GEN:  Well nourished, well  developed in no acute distress HEENT: Normal NECK: No JVD; No carotid bruits LYMPHATICS: No lymphadenopathy CARDIAC: RRR, no murmurs, rubs, gallops RESPIRATORY:  Clear to auscultation without rales, wheezing or rhonchi  ABDOMEN: Soft, non-tender, non-distended MUSCULOSKELETAL:  No edema; No deformity  SKIN: Warm and dry NEUROLOGIC:  Alert and oriented x 3 PSYCHIATRIC:  Normal affect   ASSESSMENT:    1. Bilateral carotid artery disease, unspecified type ([place])   2. Coronary artery disease involving native coronary artery without angina pectoris, unspecified whether native or transplanted heart   3. S/P CABG x 4   4. PAD (peripheral artery disease) ([place])   5. Claudication ([place])   6. Essential hypertension   7. Hyperlipidemia, mixed   8. Chronic respiratory failure with hypoxia ([facility])   9. History of stroke    PLAN:    In order of problems listed above:  CAD status post CABG x4  in 2017 Patient denies chest pain. Cath in 01/2022 showed patent grafts with no obstructive disease. Continue aspirin, beta-blocker, Repatha, Zetia, Vascepa, Crestor, and Imdur 30 mg daily.  PAD with prior stenting Patient reports unchanged bilateral calf claudication. ABIs 03/12/2022 were unchanged from prior, right side moderate lower extremity arterial disease, left moderate arterial disease. Plan for follow-up study in 12 months. Plan to continue medical management.  Carotid disease status post right carotid endarterectomy Carotid Dopplers in 06/12/2022 showed minimal disease, plan to repeat study in 1 year.    HTN Coreg previously decreased due to low blood pressure.  Blood pressure today 132/92.  She is on Coreg 6.25 mg twice daily and Imdur 30 mg daily.  Heart rate is 102 bpm.  I will stop Coreg and start Toprol 50 mg daily.  HLD Most recent lipid panel showed total cholesterol 193, triglycerides 182, HDL 41, LDL 15.  Continue Repatha, Zetia, Vascepa, and Crestor.  Chronic respiratory failure  hypoxia Suspected due to central hypoventilation possibly related to prior pontine stroke.  He was previously unable to tolerate CPAP.  She was started on 2 L of oxygen.  She is followed by pulmonology.  History of CVA Patient is followed by neurology.  Continue cholesterol medications and PT.  Disposition: Follow up in 6 month(s) with MD/APP    Signed, Teneshia Hedeen Ninfa Meeker, PA-C  10/16/2022 3:20 PM    [facility]

## 2022-10-16 NOTE — Telephone Encounter (Signed)
Erika [name] from [facility] pharmacy called stating directions and qty is not matching. Directions one a day #60. Need new script with one BID #60

## 2022-10-16 NOTE — Patient Instructions (Signed)
Medication Instructions:   Your physician has recommended you make the following change in your medication:   STOP Carvedilol  START Metoprolol Succinate - take one tablet ('50mg'$ ) by mouth daily.   *If you need a refill on your cardiac medications before your next appointment, please call your pharmacy*   Lab Work:  None Ordered  If you have labs (blood work) drawn today and your tests are completely normal, you will receive your results only by: [place] (if you have MyChart) OR A paper copy in the mail If you have any lab test that is abnormal or we need to change your treatment, we will call you to review the results.   Testing/Procedures:  None Ordered   Follow-Up: At [facility], you and your health needs are our priority.  As part of our continuing mission to provide you with exceptional heart care, we have created designated Provider Care Teams.  These Care Teams include your primary Cardiologist (physician) and Advanced Practice Providers (APPs -  Physician Assistants and Nurse Practitioners) who all work together to provide you with the care you need, when you need it.  We recommend signing up for the patient portal called "MyChart".  Sign up information is provided on this After Visit Summary.  MyChart is used to connect with patients for Virtual Visits (Telemedicine).  Patients are able to view lab/test results, encounter notes, upcoming appointments, etc.  Non-urgent messages can be sent to your provider as well.   To learn more about what you can do with MyChart, go to [email or web address].    Your next appointment:   6 month(s)  The format for your next appointment:   In Person  Provider:   [name], MD

## 2022-10-16 NOTE — Telephone Encounter (Signed)
Sorry they will not take narcotic instructions from anyone that is not an MD.

## 2022-10-17 DIAGNOSIS — G43909 Migraine, unspecified, not intractable, without status migrainosus: Secondary | ICD-10-CM | POA: Diagnosis not present

## 2022-10-17 DIAGNOSIS — G44009 Cluster headache syndrome, unspecified, not intractable: Secondary | ICD-10-CM | POA: Diagnosis not present

## 2022-10-18 DIAGNOSIS — G43909 Migraine, unspecified, not intractable, without status migrainosus: Secondary | ICD-10-CM | POA: Diagnosis not present

## 2022-10-18 DIAGNOSIS — G44009 Cluster headache syndrome, unspecified, not intractable: Secondary | ICD-10-CM | POA: Diagnosis not present

## 2022-10-19 DIAGNOSIS — G43909 Migraine, unspecified, not intractable, without status migrainosus: Secondary | ICD-10-CM | POA: Diagnosis not present

## 2022-10-19 DIAGNOSIS — G44009 Cluster headache syndrome, unspecified, not intractable: Secondary | ICD-10-CM | POA: Diagnosis not present

## 2022-10-20 DIAGNOSIS — G44009 Cluster headache syndrome, unspecified, not intractable: Secondary | ICD-10-CM | POA: Diagnosis not present

## 2022-10-20 DIAGNOSIS — G43909 Migraine, unspecified, not intractable, without status migrainosus: Secondary | ICD-10-CM | POA: Diagnosis not present

## 2022-10-24 DIAGNOSIS — G43909 Migraine, unspecified, not intractable, without status migrainosus: Secondary | ICD-10-CM | POA: Diagnosis not present

## 2022-10-24 DIAGNOSIS — G44009 Cluster headache syndrome, unspecified, not intractable: Secondary | ICD-10-CM | POA: Diagnosis not present

## 2022-10-25 DIAGNOSIS — G44009 Cluster headache syndrome, unspecified, not intractable: Secondary | ICD-10-CM | POA: Diagnosis not present

## 2022-10-25 DIAGNOSIS — G43909 Migraine, unspecified, not intractable, without status migrainosus: Secondary | ICD-10-CM | POA: Diagnosis not present

## 2022-10-26 DIAGNOSIS — G43909 Migraine, unspecified, not intractable, without status migrainosus: Secondary | ICD-10-CM | POA: Diagnosis not present

## 2022-10-26 DIAGNOSIS — G44009 Cluster headache syndrome, unspecified, not intractable: Secondary | ICD-10-CM | POA: Diagnosis not present

## 2022-10-27 DIAGNOSIS — G43909 Migraine, unspecified, not intractable, without status migrainosus: Secondary | ICD-10-CM | POA: Diagnosis not present

## 2022-10-27 DIAGNOSIS — G44009 Cluster headache syndrome, unspecified, not intractable: Secondary | ICD-10-CM | POA: Diagnosis not present

## 2022-10-28 DIAGNOSIS — G44009 Cluster headache syndrome, unspecified, not intractable: Secondary | ICD-10-CM | POA: Diagnosis not present

## 2022-10-28 DIAGNOSIS — G43909 Migraine, unspecified, not intractable, without status migrainosus: Secondary | ICD-10-CM | POA: Diagnosis not present

## 2022-10-29 DIAGNOSIS — G43909 Migraine, unspecified, not intractable, without status migrainosus: Secondary | ICD-10-CM | POA: Diagnosis not present

## 2022-10-29 DIAGNOSIS — G44009 Cluster headache syndrome, unspecified, not intractable: Secondary | ICD-10-CM | POA: Diagnosis not present

## 2022-10-30 DIAGNOSIS — G44009 Cluster headache syndrome, unspecified, not intractable: Secondary | ICD-10-CM | POA: Diagnosis not present

## 2022-10-30 DIAGNOSIS — G43909 Migraine, unspecified, not intractable, without status migrainosus: Secondary | ICD-10-CM | POA: Diagnosis not present

## 2022-10-31 ENCOUNTER — Other Ambulatory Visit: Payer: Self-pay

## 2022-10-31 DIAGNOSIS — G44009 Cluster headache syndrome, unspecified, not intractable: Secondary | ICD-10-CM | POA: Diagnosis not present

## 2022-10-31 DIAGNOSIS — E7849 Other hyperlipidemia: Secondary | ICD-10-CM

## 2022-10-31 DIAGNOSIS — G43909 Migraine, unspecified, not intractable, without status migrainosus: Secondary | ICD-10-CM | POA: Diagnosis not present

## 2022-10-31 MED ORDER — REPATHA SURECLICK 140 MG/ML ~~LOC~~ SOAJ: 140.0000 mg | SUBCUTANEOUS | 3 refills | Status: DC

## 2022-10-31 NOTE — Telephone Encounter (Signed)
From: Andrez Grime To: Office of [place], [place] Sent: 10/31/2022 12:58 PM EST Subject: Medication Renewal Request  Refills have been requested for the following medications:   Evolocumab (REPATHA SURECLICK) 546 MG/ML SOAJ [Cadence H Furth]  Preferred pharmacy: [id number] [place], [address]  This message is being sent by Sindy Guadeloupe on behalf of Erika [name]

## 2022-11-01 DIAGNOSIS — G44009 Cluster headache syndrome, unspecified, not intractable: Secondary | ICD-10-CM | POA: Diagnosis not present

## 2022-11-01 DIAGNOSIS — G43909 Migraine, unspecified, not intractable, without status migrainosus: Secondary | ICD-10-CM | POA: Diagnosis not present

## 2022-11-02 DIAGNOSIS — G43909 Migraine, unspecified, not intractable, without status migrainosus: Secondary | ICD-10-CM | POA: Diagnosis not present

## 2022-11-02 DIAGNOSIS — G44009 Cluster headache syndrome, unspecified, not intractable: Secondary | ICD-10-CM | POA: Diagnosis not present

## 2022-11-03 DIAGNOSIS — G44009 Cluster headache syndrome, unspecified, not intractable: Secondary | ICD-10-CM | POA: Diagnosis not present

## 2022-11-03 DIAGNOSIS — G43909 Migraine, unspecified, not intractable, without status migrainosus: Secondary | ICD-10-CM | POA: Diagnosis not present

## 2022-11-04 DIAGNOSIS — G43909 Migraine, unspecified, not intractable, without status migrainosus: Secondary | ICD-10-CM | POA: Diagnosis not present

## 2022-11-04 DIAGNOSIS — G44009 Cluster headache syndrome, unspecified, not intractable: Secondary | ICD-10-CM | POA: Diagnosis not present

## 2022-11-04 DIAGNOSIS — G4733 Obstructive sleep apnea (adult) (pediatric): Secondary | ICD-10-CM | POA: Diagnosis not present

## 2022-11-05 ENCOUNTER — Ambulatory Visit: Payer: BC Managed Care – PPO | Admitting: Primary Care

## 2022-11-05 DIAGNOSIS — G43909 Migraine, unspecified, not intractable, without status migrainosus: Secondary | ICD-10-CM | POA: Diagnosis not present

## 2022-11-05 DIAGNOSIS — G44009 Cluster headache syndrome, unspecified, not intractable: Secondary | ICD-10-CM | POA: Diagnosis not present

## 2022-11-05 NOTE — Progress Notes (Deleted)
$'@Patient'P$  ID: Erika [name], female    DOB: 08/16/1971, 51 y.o.   MRN: [id number]  No chief complaint on file.   Referring provider: Waunita Schooner, MD  HPI:  51 year old ex-smoker referred for evaluation of complex sleep apnea.  She had initially sought evaluation due to witnessed apneas by her husband and loud snoring.  She admits to weight gain from 182 to 225 pounds within a year after her stroke. Epworth sleepiness score is 12 Bedtime around 11 PM, she wakes up around 4 AM when her husband goes to work and finally gets out of bed in a.m. feeling tired without dryness of mouth but reports headaches There is no history suggestive of cataplexy, sleep paralysis or parasomnias   She arrives in a wheelchair today accompanied by her mother and daughter, blood pressure is 86/68 oxygen saturation is 90 percent on room air Reviewed her sleep studies Medication review shows clonazepam for anxiety, Thorazine for sleep, amitriptyline for depression and oxycodone for cluster headaches. She has previously used oxygen for headaches in the past  PMH -  [place] stroke with residual left hemiparesis 06/2021 CABG 07/2016 Diabetes type 2, insulin requiring Peripheral arterial disease Severe anxiety on clonazepam   11/05/2022- interim hx  Patient of Dr. Patsey Berthold, seen on 09/25/22 for hypoxia. Ordered for ONO and referred to sleep medicine. She saw Dr. Elsworth Soho on 10/03/22 for sleep consult.   She has mild obstructive sleep apnea. Not tolerated CPAP in the past and during titration study central apneas seemed to emerge and persisted. She is not a candidate for oral appliance. Not significant enough for hypoglossal nerve stimulation therapy. Best way to manage is to use nocturnal oxygen and monitor symptoms.   Oxygen saturation was low during that visit. No evidence of bronchospasm. CXR from 01/2022 did not showed any hyperinflation. Spirometry in the past showed no significant airway obstruction.  No significant pulmonary hypertension on echocardiogram. Ordered for PFTs d.t long standing hx smoking. Provided oxygen for bedtime and to use as needed if O2 <88%.   Coreg decreased to 2.'125mg'$  BID d/t low blood pressure      Significant tests/ events reviewed 07/11/22 Baseline study showed few central apneas 4/hour and total AHI 12/hour., low sat of 82%    07/2022 CPAP was titrated 5 to 13 cm, central apneas emerged and persisted  Echo 2/203 nml LVEF       Allergies  Allergen Reactions   Chantix [Varenicline Tartrate] Other (See Comments)    Feels "crazy" and angry     Immunization History  Administered Date(s) Administered   Influenza Split 09/29/2012   Influenza,inj,Quad PF,6+ Mos 10/17/2015, 09/03/2016, 09/17/2017, 09/24/2018, 09/07/2019, 03/01/2022   Pneumococcal Polysaccharide-23 12/26/2017   Td 07/26/2010   Tdap 03/01/2022    Past Medical History:  Diagnosis Date   Acute respiratory failure with hypoxia ([place]) 06/24/2021   Arthralgia of temporomandibular joint    CAD, multiple vessel    a. 06/2016 Cath: ostLM 40%, ostLAD 40%, pLAD 95%, ost-pLCx 60%, pLCx 95%, mLCx 60%, mRCA 95%, D2 50%, LVSF nl;  b. 07/2016 CABG x 4 (LIMA->LAD, VG->Diag, VG->OM, VG->RCA); c. 08/2016 Cath: 3VD w/ 4/4 patent grafts. LAD distal to LIMA has diff dzs->Med rx; d. 08/2020 Cath: 4/4 patent grafts, native 3VD. EF 55-65%-->Med Rx.   Carotid arterial disease ([place])    a. 07/2016 s/p R CEA; b. 02/2021 U/S: RICA 97-58%, LICA 8-32%.   Cerebrovascular disease    Clotting disorder ([place])    Complex sleep apnea syndrome 10/03/2022  Depression    Diastolic dysfunction    a. 06/2016 Echo: EF 50-55%, mild inf wall HK, GR1DD, mild MR, RV sys fxn nl, mildly dilated LA, PASP nl; b. 06/2021 Echo: EF 60-65%, no rwma. Nl RV fxn.   Fatty liver disease, nonalcoholic 2355   History of blood transfusion    with heart surgery   HLD (hyperlipidemia)    Labile hypertension    a. prior renal ngiogram negative for RAS  in 03/2016; b. catecholamines and metanephrines normal, mildly elevated renin with normal aldosterone and normal ratio in 02/2016   Myocardial infarction [facility]) 2017   Obesity    PAD (peripheral artery disease) ([place])    a. 09/2018 s/p L SFA stenting; b. 07/2019 Periph Angio: Patent m/d L SFA stent w/ 100% L SFA distal to stent. L AT 100d, L Peroneal diff dzs-->Med Rx; c. 02/2021 ABIs: stable @ 0.61 on R and 0.46 on L.   PTSD (post-traumatic stress disorder)    Tobacco abuse    Type 2 diabetes mellitus ([place]) 12/2015    Tobacco History: Social History   Tobacco Use  Smoking Status Former   Packs/day: 0.50   Years: 27.00   Total pack years: 13.50   Types: Cigarettes   Quit date: 2022   Years since quitting: 1.8  Smokeless Tobacco Never   Counseling given: Not Answered   Outpatient Medications Prior to Visit  Medication Sig Dispense Refill   acetaminophen (TYLENOL) 325 MG tablet Take 1-2 tablets (325-650 mg total) by mouth every 4 (four) hours as needed for mild pain.     albuterol (VENTOLIN HFA) 108 (90 Base) MCG/ACT inhaler Inhale 2 puffs into the lungs every 6 (six) hours as needed for wheezing. 18 g 0   ascorbic acid (VITAMIN C) 500 MG tablet Take 1 tablet (500 mg total) by mouth daily. 30 tablet 2   aspirin 81 MG chewable tablet Chew 1 tablet (81 mg total) by mouth daily with breakfast. 120 tablet 1   chlorproMAZINE (THORAZINE) 50 MG tablet Take 1 tablet (50 mg total) by mouth at bedtime. 30 tablet 2   clobetasol (TEMOVATE) 0.05 % external solution Apply 1 Application topically 2 (two) times daily. 50 mL 0   clonazePAM (KLONOPIN) 0.25 MG disintegrating tablet Take 1 tablet (0.25 mg total) by mouth 2 (two) times daily. 60 tablet 2   clopidogrel (PLAVIX) 75 MG tablet Take 1 tablet (75 mg total) by mouth daily. 90 tablet 3   Continuous Blood Gluc Sensor (DEXCOM G6 SENSOR) MISC 1 Piece by Does not apply route as needed. 1 each 1   Continuous Blood Gluc Transmit (DEXCOM G6 TRANSMITTER)  MISC 1 Piece by Does not apply route as needed. 1 each 1   cyclobenzaprine (FLEXERIL) 10 MG tablet Take 1 tablet (10 mg total) by mouth at bedtime. 30 tablet 0   donepezil (ARICEPT) 5 MG tablet Take 1 tablet (5 mg total) by mouth at bedtime. 30 tablet 11   Dulaglutide (TRULICITY) 7.32 KG/2.5KY SOPN Inject 0.75 mg into the skin once a week. 6 mL 3   escitalopram (LEXAPRO) 20 MG tablet Take 1 tablet (20 mg total) by mouth daily. 30 tablet 2   Evolocumab (REPATHA SURECLICK) 706 MG/ML SOAJ Inject 140 mg into the skin as directed. Inject 1 Dose into the skin every 14 (fourteen) days. 6 mL 3   ezetimibe (ZETIA) 10 MG tablet Take 1 tablet (10 mg total) by mouth daily. 90 tablet 0   icosapent Ethyl (VASCEPA) 1 g capsule  Take 2 g by mouth 2 (two) times daily.     insulin detemir (LEVEMIR) 100 UNIT/ML injection Inject 27 Units into the skin daily.     Insulin lispro (HUMALOG JUNIOR KWIKPEN) 100 UNIT/ML Inject 0-10 Units into the skin 3 (three) times daily. insulin Lispro (Humalog) injection 0-10 Units 0-10 Units Subcutaneous, 3 times daily with meals CBG < 70: Implement Hypoglycemia Standing Orders and refer to Hypoglycemia Standing Orders sidebar report  CBG 70 - 120: 0 unit CBG 121 - 150: 0 unit  CBG 151 - 200: 1 unit CBG 201 - 250: 2 units CBG 251 - 300: 4 units CBG 301 - 350: 6 units  CBG 351 - 400: 8 units  CBG > 400: 10 units 3 mL 3   isosorbide mononitrate (IMDUR) 30 MG 24 hr tablet Take 1 tablet (30 mg total) by mouth daily. 90 tablet 1   lamoTRIgine (LAMICTAL) 200 MG tablet Take 1 tablet (200 mg total) by mouth at bedtime. 30 tablet 2   metoprolol succinate (TOPROL-XL) 50 MG 24 hr tablet Take 1 tablet (50 mg total) by mouth daily. Take with or immediately following a meal. 90 tablet 3   nitroGLYCERIN (NITROSTAT) 0.4 MG SL tablet Place 1 tablet (0.4 mg total) under the tongue every 5 (five) minutes as needed for chest pain. 25 tablet 1   Oxycodone HCl 10 MG TABS Take 1 tablet (10 mg total) by mouth 2  (two) times daily as needed. 60 tablet 0   pantoprazole (PROTONIX) 40 MG tablet TAKE ONE TABLET BY MOUTH ONE TIME DAILY 30 tablet 0   pregabalin (LYRICA) 225 MG capsule TAKE ONE CAPSULE BY MOUTH TWICE A DAY 60 capsule 2   rosuvastatin (CRESTOR) 20 MG tablet Take 1 tablet (20 mg total) by mouth daily. 90 tablet 1   senna-docusate (SENOKOT-S) 8.6-50 MG tablet Take 2 tablets by mouth at bedtime. 120 tablet 0   triamcinolone cream (KENALOG) 0.1 % Apply 1 Application topically 2 (two) times daily. 30 g 0   ULTICARE MINI PEN NEEDLES 31G X 6 MM MISC USE PEN NEEDLES TWO TIMES DAILY 100 each 3   vitamin B-12 1000 MCG tablet Take 1 tablet (1,000 mcg total) by mouth daily.     Vitamin D, Ergocalciferol, (DRISDOL) 1.25 MG (50000 UNIT) CAPS capsule TAKE ONE CAPSULE BY MOUTH EVERY WEEK 5 capsule 0   No facility-administered medications prior to visit.      Review of Systems  Review of Systems   Physical Exam  There were no vitals taken for this visit. Physical Exam   Lab Results:  CBC    Component Value Date/Time   WBC 8.7 02/01/2022 0549   RBC 3.95 02/01/2022 0549   HGB 11.0 (L) 02/01/2022 0549   HGB 14.2 09/11/2021 1413   HCT 35.0 (L) 02/01/2022 0549   HCT 43.5 09/11/2021 1413   PLT 309 02/01/2022 0549   PLT 433 09/11/2021 1413   MCV 88.6 02/01/2022 0549   MCV 89 09/11/2021 1413   MCV 89 01/11/2015 1022   MCH 27.8 02/01/2022 0549   MCHC 31.4 02/01/2022 0549   RDW 15.5 02/01/2022 0549   RDW 12.8 09/11/2021 1413   RDW 13.8 01/11/2015 1022   LYMPHSABS 2.7 01/26/2022 1349   LYMPHSABS 2.6 09/11/2021 1413   LYMPHSABS 3.4 01/11/2014 1312   MONOABS 0.6 01/26/2022 1349   MONOABS 0.7 01/11/2014 1312   EOSABS 0.1 01/26/2022 1349   EOSABS 0.2 09/11/2021 1413   EOSABS 0.3 01/11/2014 1312  BASOSABS 0.0 01/26/2022 1349   BASOSABS 0.0 09/11/2021 1413   BASOSABS 0.1 01/11/2014 1312    BMET    Component Value Date/Time   NA 138 01/27/2022 0441   NA 138 09/11/2021 1413   NA 135 (L)  01/11/2015 1022   K 3.3 (L) 01/27/2022 0441   K 3.9 01/11/2015 1022   CL 105 01/27/2022 0441   CL 100 01/11/2015 1022   CO2 28 01/27/2022 0441   CO2 27 01/11/2015 1022   GLUCOSE 78 01/27/2022 0441   GLUCOSE 183 (H) 01/11/2015 1022   BUN 11 01/27/2022 0441   BUN 10 09/11/2021 1413   BUN 6 (L) 01/11/2015 1022   CREATININE 0.70 01/29/2022 0436   CREATININE 0.77 01/11/2015 1022   CALCIUM 8.5 (L) 01/27/2022 0441   CALCIUM 10.0 01/11/2015 1022   GFRNONAA >60 01/29/2022 0436   GFRNONAA >60 01/11/2015 1022   GFRNONAA 41 (L) 08/10/2014 1459   GFRAA 106 01/12/2021 0934   GFRAA >60 01/11/2015 1022   GFRAA 48 (L) 08/10/2014 1459    BNP    Component Value Date/Time   BNP 351.5 (H) 01/26/2022 1733    ProBNP No results found for: "PROBNP"  Imaging: SLEEP STUDY DOCUMENTS  Result Date: 10/09/2022 Ordered by an unspecified provider.  SLEEP STUDY DOCUMENTS  Result Date: 10/09/2022 Ordered by an unspecified provider.    Assessment & Plan:   No problem-specific Assessment & Plan notes found for this encounter.     Martyn Ehrich, NP 11/05/2022

## 2022-11-06 DIAGNOSIS — G44009 Cluster headache syndrome, unspecified, not intractable: Secondary | ICD-10-CM | POA: Diagnosis not present

## 2022-11-06 DIAGNOSIS — G43909 Migraine, unspecified, not intractable, without status migrainosus: Secondary | ICD-10-CM | POA: Diagnosis not present

## 2022-11-07 DIAGNOSIS — G43909 Migraine, unspecified, not intractable, without status migrainosus: Secondary | ICD-10-CM | POA: Diagnosis not present

## 2022-11-07 DIAGNOSIS — G4733 Obstructive sleep apnea (adult) (pediatric): Secondary | ICD-10-CM | POA: Diagnosis not present

## 2022-11-07 DIAGNOSIS — G44009 Cluster headache syndrome, unspecified, not intractable: Secondary | ICD-10-CM | POA: Diagnosis not present

## 2022-11-08 DIAGNOSIS — G43909 Migraine, unspecified, not intractable, without status migrainosus: Secondary | ICD-10-CM | POA: Diagnosis not present

## 2022-11-08 DIAGNOSIS — G44009 Cluster headache syndrome, unspecified, not intractable: Secondary | ICD-10-CM | POA: Diagnosis not present

## 2022-11-09 ENCOUNTER — Ambulatory Visit: Payer: Managed Care, Other (non HMO) | Admitting: Physical Medicine and Rehabilitation

## 2022-11-09 ENCOUNTER — Encounter: Payer: Self-pay | Admitting: Registered Nurse

## 2022-11-09 ENCOUNTER — Encounter: Payer: BC Managed Care – PPO | Attending: Physical Medicine and Rehabilitation | Admitting: Registered Nurse

## 2022-11-09 VITALS — BP 96/63 | HR 72 | Ht 67.0 in | Wt 239.0 lb

## 2022-11-09 DIAGNOSIS — G43909 Migraine, unspecified, not intractable, without status migrainosus: Secondary | ICD-10-CM | POA: Diagnosis not present

## 2022-11-09 DIAGNOSIS — G44029 Chronic cluster headache, not intractable: Secondary | ICD-10-CM | POA: Insufficient documentation

## 2022-11-09 DIAGNOSIS — G894 Chronic pain syndrome: Secondary | ICD-10-CM | POA: Insufficient documentation

## 2022-11-09 DIAGNOSIS — Z79899 Other long term (current) drug therapy: Secondary | ICD-10-CM | POA: Diagnosis not present

## 2022-11-09 DIAGNOSIS — Z5181 Encounter for therapeutic drug level monitoring: Secondary | ICD-10-CM | POA: Diagnosis not present

## 2022-11-09 DIAGNOSIS — G44009 Cluster headache syndrome, unspecified, not intractable: Secondary | ICD-10-CM | POA: Diagnosis not present

## 2022-11-09 MED ORDER — OXYCODONE HCL 10 MG PO TABS: 10.0000 mg | ORAL_TABLET | Freq: Two times a day (BID) | ORAL | 0 refills | Status: DC | PRN

## 2022-11-09 NOTE — Progress Notes (Unsigned)
Subjective:    Patient ID: Erika [name], female    DOB: 1971/03/11, 51 y.o.   MRN: [id number]  HPI: Erika [name] is a 51 y.o. female who returns for follow up appointment for chronic pain and medication refill. She states she has chronic headache . She rates her pain 6. Her current exercise regime is walking in her home with Hemi-walker.  [name] Morphine equivalent is 30.00 MME. She  is also prescribed Clonazepam  by Dr. Adele Schilder .We have discussed the black box warning of using opioids and benzodiazepines. I highlighted the dangers of using these drugs together and discussed the adverse events including respiratory suppression, overdose, cognitive impairment and importance of compliance with current regimen. We will continue to monitor and adjust as indicated.  she is being closely monitored and under the care of her psychiatrist.  Last UDS was Performed 08/09/2022, see note for details.    Pain Inventory Average Pain 8 Pain Right Now 6 My pain is constant, sharp, burning, dull, stabbing, tingling, and aching  In the last 24 hours, has pain interfered with the following? General activity 4 Relation with others 0 Enjoyment of life 0 What TIME of day is your pain at its worst? night Sleep (in general) Good  Pain is worse with: unsure Pain improves with: rest, medication, and oxygen therapy Relief from Meds: 4  Family History  Adopted: Yes  Problem Relation Age of Onset   Diabetes Mother    Diabetes Father    Alcohol abuse Father    Heart disease Father    Drug abuse Father    Stroke Sister    Anxiety disorder Sister    Social History   Socioeconomic History   Marital status: Married    Spouse name: Not on file   Number of children: 1   Years of education: 14   Highest education level: Not on file  Occupational History   Occupation: Disability  Tobacco Use   Smoking status: Former    Packs/day: 0.50    Years: 27.00    Total pack years: 13.50     Types: Cigarettes    Quit date: 2022    Years since quitting: 1.8   Smokeless tobacco: Never  Vaping Use   Vaping Use: Every day   Substances: Flavoring  Substance and Sexual Activity   Alcohol use: Not Currently    Alcohol/week: 0.0 standard drinks of alcohol    Comment: socially   Drug use: No   Sexual activity: Yes    Partners: Male    Birth control/protection: None  Other Topics Concern   Not on file  Social History Narrative   11/21/20   From: [name]   Living: with husband, [name] (2009) and special needs daughter    Work: disability       Family: daughter - [name] (special needs, lives with her) and 2 grown step children - [name] and [name]       Enjoys: play with dogs - 2 german Shepard, mut, and blue tick walker mix      Exercise: PAD limits exercise   Diet: diabetic diet and low potassium due to daughter      Safety   Seat belts: Yes    Guns: Yes  and secure   Safe in relationships: Yes    Social Determinants of Health   Financial Resource Strain: Not on file  Food Insecurity: Not on file  Transportation Needs: Not on file  Physical Activity: Not on  file  Stress: Not on file  Social Connections: Not on file   Past Surgical History:  Procedure Laterality Date   ABDOMINAL AORTOGRAM W/LOWER EXTREMITY [name] 10/15/2018   Procedure: ABDOMINAL AORTOGRAM W/LOWER EXTREMITY;  Surgeon: Wellington Hampshire, MD;  Location: [place] [facility];  Service: Cardiovascular;  Laterality: [name];   ABDOMINAL AORTOGRAM W/LOWER EXTREMITY Bilateral 08/19/2019   Procedure: ABDOMINAL AORTOGRAM W/LOWER EXTREMITY;  Surgeon: Wellington Hampshire, MD;  Location: [place] [facility];  Service: Cardiovascular;  Laterality: Bilateral;   CARDIAC CATHETERIZATION [name] 06/29/2016   Procedure: Left Heart Cath and Coronary Angiography;  Surgeon: Minna Merritts, MD;  Location: [place] [facility];  Service: Cardiovascular;  Laterality: [name];   CARDIAC CATHETERIZATION [name] 08/29/2016   Procedure:  Left Heart Cath and Cors/Grafts Angiography;  Surgeon: Wellington Hampshire, MD;  Location: [place] [facility];  Service: Cardiovascular;  Laterality: [name];   CESAREAN SECTION     CHOLECYSTECTOMY     CORONARY ARTERY BYPASS GRAFT [name] 07/06/2016   Procedure: CORONARY ARTERY BYPASS GRAFTING (CABG) x four, using left internal mammary artery and right leg greater saphenous vein harvested endoscopically;  Surgeon: Ivin Poot, MD;  Location: [place];  Service: Open Heart Surgery;  Laterality: [name];   ENDARTERECTOMY Right 07/06/2016   Procedure: ENDARTERECTOMY CAROTID;  Surgeon: Rosetta Posner, MD;  Location: [facility] OR;  Service: Vascular;  Laterality: Right;   ENDARTERECTOMY Right 04/27/2020   Procedure: REDO OF RIGHT ENDARTERECTOMY CAROTID;  Surgeon: Rosetta Posner, MD;  Location: [facility];  Service: Vascular;  Laterality: Right;   LEFT HEART CATH AND CORS/GRAFTS ANGIOGRAPHY [name] 08/24/2020   Procedure: LEFT HEART CATH AND CORS/GRAFTS ANGIOGRAPHY;  Surgeon: Wellington Hampshire, MD;  Location: [place] [facility];  Service: Cardiovascular;  Laterality: [name];   LEFT HEART CATH AND CORS/GRAFTS ANGIOGRAPHY [name] 01/29/2022   Procedure: LEFT HEART CATH AND CORS/GRAFTS ANGIOGRAPHY;  Surgeon: Wellington Hampshire, MD;  Location: [place] [facility];  Service: Cardiovascular;  Laterality: [name];   PERIPHERAL VASCULAR CATHETERIZATION [name] 04/18/2016   Procedure: Renal Angiography;  Surgeon: Wellington Hampshire, MD;  Location: [place] [facility];  Service: Cardiovascular;  Laterality: [name];   PERIPHERAL VASCULAR INTERVENTION Left 10/15/2018   Procedure: PERIPHERAL VASCULAR INTERVENTION;  Surgeon: Wellington Hampshire, MD;  Location: [place] [facility];  Service: Cardiovascular;  Laterality: Left;  Left superficial femoral   TEE WITHOUT CARDIOVERSION [name] 07/06/2016   Procedure: TRANSESOPHAGEAL ECHOCARDIOGRAM (TEE);  Surgeon: Ivin Poot, MD;  Location: [place];  Service: Open Heart Surgery;  Laterality: [name];   TONSILLECTOMY     Past Surgical History:   Procedure Laterality Date   ABDOMINAL AORTOGRAM W/LOWER EXTREMITY [name] 10/15/2018   Procedure: ABDOMINAL AORTOGRAM W/LOWER EXTREMITY;  Surgeon: Wellington Hampshire, MD;  Location: [place] [facility];  Service: Cardiovascular;  Laterality: [name];   ABDOMINAL AORTOGRAM W/LOWER EXTREMITY Bilateral 08/19/2019   Procedure: ABDOMINAL AORTOGRAM W/LOWER EXTREMITY;  Surgeon: Wellington Hampshire, MD;  Location: [place] [facility];  Service: Cardiovascular;  Laterality: Bilateral;   CARDIAC CATHETERIZATION [name] 06/29/2016   Procedure: Left Heart Cath and Coronary Angiography;  Surgeon: Minna Merritts, MD;  Location: [place] [facility];  Service: Cardiovascular;  Laterality: [name];   CARDIAC CATHETERIZATION [name] 08/29/2016   Procedure: Left Heart Cath and Cors/Grafts Angiography;  Surgeon: Wellington Hampshire, MD;  Location: [place] [facility];  Service: Cardiovascular;  Laterality: [name];   CESAREAN SECTION     CHOLECYSTECTOMY     CORONARY  ARTERY BYPASS GRAFT [name] 07/06/2016   Procedure: CORONARY ARTERY BYPASS GRAFTING (CABG) x four, using left internal mammary artery and right leg greater saphenous vein harvested endoscopically;  Surgeon: Ivin Poot, MD;  Location: [place];  Service: Open Heart Surgery;  Laterality: [name];   ENDARTERECTOMY Right 07/06/2016   Procedure: ENDARTERECTOMY CAROTID;  Surgeon: Rosetta Posner, MD;  [facility];  Service: Vascular;  Laterality: Right;   ENDARTERECTOMY Right 04/27/2020   Procedure: REDO OF RIGHT ENDARTERECTOMY CAROTID;  Surgeon: Rosetta Posner, MD;  Location: [facility] OR;  Service: Vascular;  Laterality: Right;   LEFT HEART CATH AND CORS/GRAFTS ANGIOGRAPHY [name] 08/24/2020   Procedure: LEFT HEART CATH AND CORS/GRAFTS ANGIOGRAPHY;  Surgeon: Wellington Hampshire, MD;  Location: [place] [facility];  Service: Cardiovascular;  Laterality: [name];   LEFT HEART CATH AND CORS/GRAFTS ANGIOGRAPHY [name] 01/29/2022   Procedure: LEFT HEART CATH AND CORS/GRAFTS ANGIOGRAPHY;  Surgeon: Wellington Hampshire, MD;  Location:  [place] [facility];  Service: Cardiovascular;  Laterality: [name];   PERIPHERAL VASCULAR CATHETERIZATION [name] 04/18/2016   Procedure: Renal Angiography;  Surgeon: Wellington Hampshire, MD;  Location: [place] [facility];  Service: Cardiovascular;  Laterality: [name];   PERIPHERAL VASCULAR INTERVENTION Left 10/15/2018   Procedure: PERIPHERAL VASCULAR INTERVENTION;  Surgeon: Wellington Hampshire, MD;  Location: [place] [facility];  Service: Cardiovascular;  Laterality: Left;  Left superficial femoral   TEE WITHOUT CARDIOVERSION [name] 07/06/2016   Procedure: TRANSESOPHAGEAL ECHOCARDIOGRAM (TEE);  Surgeon: Ivin Poot, MD;  Location: [place];  Service: Open Heart Surgery;  Laterality: [name];   TONSILLECTOMY     Past Medical History:  Diagnosis Date   Acute respiratory failure with hypoxia ([facility]) 06/24/2021   Arthralgia of temporomandibular joint    CAD, multiple vessel    a. 06/2016 Cath: ostLM 40%, ostLAD 40%, pLAD 95%, ost-pLCx 60%, pLCx 95%, mLCx 60%, mRCA 95%, D2 50%, LVSF nl;  b. 07/2016 CABG x 4 (LIMA->LAD, VG->Diag, VG->OM, VG->RCA); c. 08/2016 Cath: 3VD w/ 4/4 patent grafts. LAD distal to LIMA has diff dzs->Med rx; d. 08/2020 Cath: 4/4 patent grafts, native 3VD. EF 55-65%-->Med Rx.   Carotid arterial disease ([place])    a. 07/2016 s/p R CEA; b. 02/2021 U/S: RICA 11-91%, LICA 4-78%.   Cerebrovascular disease    Clotting disorder ([place])    Complex sleep apnea syndrome 10/03/2022   Depression    Diastolic dysfunction    a. 06/2016 Echo: EF 50-55%, mild inf wall HK, GR1DD, mild MR, RV sys fxn nl, mildly dilated LA, PASP nl; b. 06/2021 Echo: EF 60-65%, no rwma. Nl RV fxn.   Fatty liver disease, nonalcoholic 2956   History of blood transfusion    with heart surgery   HLD (hyperlipidemia)    Labile hypertension    a. prior renal ngiogram negative for RAS in 03/2016; b. catecholamines and metanephrines normal, mildly elevated renin with normal aldosterone and normal ratio in 02/2016   Myocardial infarction [facility]) 2017    Obesity    PAD (peripheral artery disease) ([place])    a. 09/2018 s/p L SFA stenting; b. 07/2019 Periph Angio: Patent m/d L SFA stent w/ 100% L SFA distal to stent. L AT 100d, L Peroneal diff dzs-->Med Rx; c. 02/2021 ABIs: stable @ 0.61 on R and 0.46 on L.   PTSD (post-traumatic stress disorder)    Tobacco abuse    Type 2 diabetes mellitus ([place]) 12/2015   BP 96/63   Pulse 72   Ht '5\' 7"'$  (  1.702 m)   Wt 239 lb (108.4 kg)   SpO2 95%   BMI 37.43 kg/m   Opioid Risk Score:   Fall Risk Score:  `1  Depression screen [facility] 2/9     10/09/2022    2:17 PM 08/09/2022   10:49 AM 11/10/2021    1:25 PM 09/06/2021    2:55 PM 08/09/2021   11:16 AM 05/10/2021    9:08 AM 02/23/2021    8:35 AM  Depression screen PHQ 2/9  Decreased Interest 0 '1 1 3 2 '$ 0 0  Down, Depressed, Hopeless 0 '1 1 3 3 '$ 0 0  PHQ - 2 Score 0 '2 2 6 5 '$ 0 0  Altered sleeping     0    Tired, decreased energy     3    Change in appetite     2    Feeling bad or failure about yourself      3    Trouble concentrating     3    Moving slowly or fidgety/restless     2    Suicidal thoughts     0    PHQ-9 Score     18    Difficult doing work/chores     Very difficult      Review of Systems  Neurological:  Positive for headaches.  All other systems reviewed and are negative.      Objective:   Physical Exam Vitals and nursing note reviewed.  Constitutional:      Appearance: Normal appearance.  Cardiovascular:     Rate and Rhythm: Normal rate and regular rhythm.     Pulses: Normal pulses.     Heart sounds: Normal heart sounds.  Pulmonary:     Effort: Pulmonary effort is normal.     Breath sounds: Normal breath sounds.  Musculoskeletal:     Cervical back: Normal range of motion and neck supple.     Comments: Normal Muscle Bulk and Muscle Testing Reveals:  Upper Extremities: Right: Full ROM and Muscle Strength 5/5 Left Upper Extremity: Decreased ROM ROM and Muscle Strength 1/5  Lower Extremities : Right Full ROM  and Muscle Strength  5/5 Left Lower Extremity: Decreased ROM and Muscle Strength 3/5 Arrived in wheelchair     Skin:    General: Skin is warm and dry.  Neurological:     Mental Status: She is alert and oriented to person, place, and time.  Psychiatric:        Mood and Affect: Mood normal.        Behavior: Behavior normal.         Assessment & Plan:  Cerebrovascular Accident: Neurology Following. Continue current medication regimen. Continue to Monitor. 11/09/2022 Cluster Headache: She is scheduled for Botox with Dr Ranell Patrick. R3. Bilateral Lower Extremities Pain:No complaints today.  Continue HEP as Tolerated. Continue to Monitor. 11/09/2022 Chronic Pain Refilled: Oxycodone 10 mg daily as needed for pain . #30. We will continue the opioid monitoring program, this consists of regular clinic visits, examinations, urine drug screen, pill counts as well as use of [place] Controlled Substance Reporting system. A 12 month History has been reviewed on the [place] on 11/09/2022   Neuropathy: Continue Lyrica. Continue to Monitor. 11/09/2022  5. Insomnia: Continue Amitriptyline. Continue to Monitor. 11/09/2022   F/U in 1 month

## 2022-11-10 DIAGNOSIS — G44009 Cluster headache syndrome, unspecified, not intractable: Secondary | ICD-10-CM | POA: Diagnosis not present

## 2022-11-10 DIAGNOSIS — G43909 Migraine, unspecified, not intractable, without status migrainosus: Secondary | ICD-10-CM | POA: Diagnosis not present

## 2022-11-11 ENCOUNTER — Other Ambulatory Visit: Payer: Self-pay | Admitting: Physical Medicine and Rehabilitation

## 2022-11-11 ENCOUNTER — Other Ambulatory Visit: Payer: Self-pay | Admitting: Cardiovascular Disease

## 2022-11-11 DIAGNOSIS — E7849 Other hyperlipidemia: Secondary | ICD-10-CM

## 2022-11-11 DIAGNOSIS — G43909 Migraine, unspecified, not intractable, without status migrainosus: Secondary | ICD-10-CM | POA: Diagnosis not present

## 2022-11-11 DIAGNOSIS — G629 Polyneuropathy, unspecified: Secondary | ICD-10-CM

## 2022-11-11 DIAGNOSIS — S8252XD Displaced fracture of medial malleolus of left tibia, subsequent encounter for closed fracture with routine healing: Secondary | ICD-10-CM | POA: Diagnosis not present

## 2022-11-11 DIAGNOSIS — G44009 Cluster headache syndrome, unspecified, not intractable: Secondary | ICD-10-CM | POA: Diagnosis not present

## 2022-11-12 ENCOUNTER — Telehealth: Payer: Self-pay

## 2022-11-12 DIAGNOSIS — G43909 Migraine, unspecified, not intractable, without status migrainosus: Secondary | ICD-10-CM | POA: Diagnosis not present

## 2022-11-12 DIAGNOSIS — J9611 Chronic respiratory failure with hypoxia: Secondary | ICD-10-CM

## 2022-11-12 DIAGNOSIS — G44009 Cluster headache syndrome, unspecified, not intractable: Secondary | ICD-10-CM | POA: Diagnosis not present

## 2022-11-12 NOTE — Telephone Encounter (Signed)
PMP was Reviewed.  Pregabalin e- scribed today.

## 2022-11-12 NOTE — Telephone Encounter (Signed)
ONO reviewed by Dr. Eleonore Chiquito QHS and repeat ONO on O2.  Lm for patient.

## 2022-11-12 NOTE — Telephone Encounter (Signed)
PMP was Reviewed,

## 2022-11-13 ENCOUNTER — Ambulatory Visit: Payer: BC Managed Care – PPO | Admitting: Primary Care

## 2022-11-13 ENCOUNTER — Encounter: Payer: Self-pay | Admitting: Registered Nurse

## 2022-11-13 DIAGNOSIS — G43909 Migraine, unspecified, not intractable, without status migrainosus: Secondary | ICD-10-CM | POA: Diagnosis not present

## 2022-11-13 DIAGNOSIS — G44009 Cluster headache syndrome, unspecified, not intractable: Secondary | ICD-10-CM | POA: Diagnosis not present

## 2022-11-13 NOTE — Telephone Encounter (Signed)
Apologize as the study was noted to be "room air" by the report.  However if she was wearing the oxygen going ahead and increase to 3 L/min during sleep.  This should hopefully get her on the ballpark where she needs to be.

## 2022-11-13 NOTE — Telephone Encounter (Signed)
Patient is aware of below message/recommendations and voiced her understanding.  Order has been placed. Nothing further needed.

## 2022-11-13 NOTE — Telephone Encounter (Signed)
Spoke to patient and relayed below results. She stated that she has nocturnal oxygen already and she wore 2L during ONO.  Dr. Patsey Berthold, please advise. Thanks

## 2022-11-14 ENCOUNTER — Other Ambulatory Visit: Payer: Self-pay | Admitting: Cardiovascular Disease

## 2022-11-14 DIAGNOSIS — G44009 Cluster headache syndrome, unspecified, not intractable: Secondary | ICD-10-CM | POA: Diagnosis not present

## 2022-11-14 DIAGNOSIS — G43909 Migraine, unspecified, not intractable, without status migrainosus: Secondary | ICD-10-CM | POA: Diagnosis not present

## 2022-11-15 ENCOUNTER — Other Ambulatory Visit: Payer: Self-pay | Admitting: Cardiovascular Disease

## 2022-11-16 ENCOUNTER — Other Ambulatory Visit: Payer: Self-pay | Admitting: Cardiovascular Disease

## 2022-11-19 ENCOUNTER — Ambulatory Visit: Payer: BC Managed Care – PPO | Admitting: Physical Medicine and Rehabilitation

## 2022-11-20 ENCOUNTER — Encounter: Payer: Self-pay | Admitting: Pulmonary Disease

## 2022-11-21 ENCOUNTER — Other Ambulatory Visit: Payer: Self-pay | Admitting: Physical Medicine and Rehabilitation

## 2022-11-22 ENCOUNTER — Encounter: Payer: BC Managed Care – PPO | Admitting: Physical Medicine and Rehabilitation

## 2022-11-27 ENCOUNTER — Encounter: Payer: Self-pay | Admitting: Primary Care

## 2022-11-27 ENCOUNTER — Ambulatory Visit (INDEPENDENT_AMBULATORY_CARE_PROVIDER_SITE_OTHER): Payer: BC Managed Care – PPO | Admitting: Primary Care

## 2022-11-27 ENCOUNTER — Telehealth: Payer: Self-pay | Admitting: Primary Care

## 2022-11-27 VITALS — BP 114/70 | HR 60 | Temp 98.4°F | Ht 67.0 in | Wt 229.6 lb

## 2022-11-27 DIAGNOSIS — G4731 Primary central sleep apnea: Secondary | ICD-10-CM

## 2022-11-27 DIAGNOSIS — J9611 Chronic respiratory failure with hypoxia: Secondary | ICD-10-CM

## 2022-11-27 NOTE — Assessment & Plan Note (Addendum)
-   Predominantly obstructive sleep apnea with few centrals. OSA is mild, patient did not tolerate CPAP.  She is not a candidate for oral appliance and apneas are not significant enough for inspire device.  Dr. Elsworth Soho recommended patient continue nocturnal oxygen and monitor symptoms. Repeat ONO on 3L oxygen.

## 2022-11-27 NOTE — Assessment & Plan Note (Addendum)
-   Stable; O2 at rest on room air was 92% - Continue 3L oxygen at bedtime and prn during day to maintain O2 >88-90% - CXR from February 2023 without hyperinflation. Spirometry in the past showed no obstructive airway disease  - Needs pulmonary function testing scheduled to re-assess for obstructive airway disease d/t past smoking hx  - Adding humidification to home oxygen concentrator

## 2022-11-27 NOTE — Progress Notes (Signed)
$'@Patient'a$  ID: Andrez Grime, female    DOB: 1971/10/14, 51 y.o.   MRN: [id number]  Chief Complaint  Patient presents with   Follow-up    Doing ok.  Oxygen/ POC is drying nasal passages.  Needs oxygen with any exertion.D/C'd by Dr. Loma Messing [facility] 10/03/2022    Referring provider: Waunita Schooner, MD  HPI: 51 year old ex-smoker referred for evaluation of complex sleep apnea.  She had initially sought evaluation due to witnessed apneas by her husband and loud snoring.  She admits to weight gain from 182 to 225 pounds within a year after her stroke. Epworth sleepiness score is 12 Bedtime around 11 PM, she wakes up around 4 AM when her husband goes to work and finally gets out of bed in a.m. feeling tired without dryness of mouth but reports headaches There is no history suggestive of cataplexy, sleep paralysis or parasomnias   She arrives in a wheelchair today accompanied by her mother and daughter, blood pressure is 86/68 oxygen saturation is 90 percent on room air Reviewed her sleep studies Medication review shows clonazepam for anxiety, Thorazine for sleep, amitriptyline for depression and oxycodone for cluster headaches. She has previously used oxygen for headaches in the past  PMH -  [place] stroke with residual left hemiparesis 06/2021 CABG 07/2016 Diabetes type 2, insulin requiring Peripheral arterial disease Severe anxiety on clonazepam   11/27/2022- interim hx  Patient of Dr. Patsey Berthold, seen on 09/25/22 for hypoxia. Ordered for ONO and referred to sleep medicine. She saw Dr. Elsworth Soho on 10/03/22 for sleep consult d/t complex sleep apnea syndrome. Central apneas are <5/hr so below threshold of significance, could b related to prior hx stroke and also use of sedating medication such as oxycodone, clonazepam and thorazine. She has mild obstructive sleep apnea. She did not tolerate CPAP in the past and during titration study central apneas seemed to emerge and persisted.  She is not a candidate for oral appliance. Sleep apnea is not significant enough for hypoglossal nerve stimulation therapy. Best way to manage is to use nocturnal oxygen and monitor symptoms.   Oxygen saturation was low during her last visit. No evidence of bronchospasm. CXR from 01/2022 did not showed any hyperinflation. Spirometry in the past showed no significant airway obstruction. No significant pulmonary hypertension on echocardiogram. Ordered for PFTs d.t long standing hx smoking. Provided oxygen for bedtime and to use as needed if O2 <88%. Coreg decreased  d/t low blood pressure  She is doing alright today. Accompanied by her husband. She has no acute complaints today. Overnight oximetry test was completed while patient was wearing 2 L of oxygen, advised to increase to 3 L at bedtime.  Will need repeat ONO. Ordered for PFTs but these have not been scheduled. She reports nasal dryness with oxygen use.   Significant tests/ events reviewed 07/11/22 Baseline study showed few central apneas 4/hour and total AHI 12/hour., low sat of 82%    07/2022 CPAP was titrated 5 to 13 cm, central apneas emerged and persisted  Echo 2/203 nml LVEF    Allergies  Allergen Reactions   Chantix [Varenicline Tartrate] Other (See Comments)    Feels "crazy" and angry     Immunization History  Administered Date(s) Administered   Influenza Split 09/29/2012   Influenza,inj,Quad PF,6+ Mos 10/17/2015, 09/03/2016, 09/17/2017, 09/24/2018, 09/07/2019, 03/01/2022   Pneumococcal Polysaccharide-23 12/26/2017   Td 07/26/2010   Tdap 03/01/2022    Past Medical History:  Diagnosis Date   Acute respiratory failure with hypoxia ([place])  06/24/2021   Arthralgia of temporomandibular joint    CAD, multiple vessel    a. 06/2016 Cath: ostLM 40%, ostLAD 40%, pLAD 95%, ost-pLCx 60%, pLCx 95%, mLCx 60%, mRCA 95%, D2 50%, LVSF nl;  b. 07/2016 CABG x 4 (LIMA->LAD, VG->Diag, VG->OM, VG->RCA); c. 08/2016 Cath: 3VD w/ 4/4 patent grafts.  LAD distal to LIMA has diff dzs->Med rx; d. 08/2020 Cath: 4/4 patent grafts, native 3VD. EF 55-65%-->Med Rx.   Carotid arterial disease ([place])    a. 07/2016 s/p R CEA; b. 02/2021 U/S: RICA 93-23%, LICA 5-57%.   Cerebrovascular disease    Clotting disorder ([place])    Complex sleep apnea syndrome 10/03/2022   Depression    Diastolic dysfunction    a. 06/2016 Echo: EF 50-55%, mild inf wall HK, GR1DD, mild MR, RV sys fxn nl, mildly dilated LA, PASP nl; b. 06/2021 Echo: EF 60-65%, no rwma. Nl RV fxn.   Fatty liver disease, nonalcoholic 3220   History of blood transfusion    with heart surgery   HLD (hyperlipidemia)    Labile hypertension    a. prior renal ngiogram negative for RAS in 03/2016; b. catecholamines and metanephrines normal, mildly elevated renin with normal aldosterone and normal ratio in 02/2016   Myocardial infarction [facility]) 2017   Obesity    PAD (peripheral artery disease) ([place])    a. 09/2018 s/p L SFA stenting; b. 07/2019 Periph Angio: Patent m/d L SFA stent w/ 100% L SFA distal to stent. L AT 100d, L Peroneal diff dzs-->Med Rx; c. 02/2021 ABIs: stable @ 0.61 on R and 0.46 on L.   PTSD (post-traumatic stress disorder)    Tobacco abuse    Type 2 diabetes mellitus ([place]) 12/2015    Tobacco History: Social History   Tobacco Use  Smoking Status Former   Packs/day: 0.50   Years: 27.00   Total pack years: 13.50   Types: Cigarettes   Quit date: 2022   Years since quitting: 1.9  Smokeless Tobacco Never   Counseling given: Not Answered   Outpatient Medications Prior to Visit  Medication Sig Dispense Refill   acetaminophen (TYLENOL) 325 MG tablet Take 1-2 tablets (325-650 mg total) by mouth every 4 (four) hours as needed for mild pain.     albuterol (VENTOLIN HFA) 108 (90 Base) MCG/ACT inhaler Inhale 2 puffs into the lungs every 6 (six) hours as needed for wheezing. 18 g 0   ascorbic acid (VITAMIN C) 500 MG tablet Take 1 tablet (500 mg total) by mouth daily. 30 tablet 2   aspirin 81  MG chewable tablet Chew 1 tablet (81 mg total) by mouth daily with breakfast. 120 tablet 1   chlorproMAZINE (THORAZINE) 50 MG tablet Take 1 tablet (50 mg total) by mouth at bedtime. 30 tablet 2   clobetasol (TEMOVATE) 0.05 % external solution Apply 1 Application topically 2 (two) times daily. 50 mL 0   clonazePAM (KLONOPIN) 0.25 MG disintegrating tablet Take 1 tablet (0.25 mg total) by mouth 2 (two) times daily. 60 tablet 2   clopidogrel (PLAVIX) 75 MG tablet Take 1 tablet (75 mg total) by mouth daily. 90 tablet 3   Continuous Blood Gluc Sensor (DEXCOM G6 SENSOR) MISC 1 Piece by Does not apply route as needed. 1 each 1   Continuous Blood Gluc Transmit (DEXCOM G6 TRANSMITTER) MISC 1 Piece by Does not apply route as needed. 1 each 1   cyclobenzaprine (FLEXERIL) 10 MG tablet Take 1 tablet (10 mg total) by mouth at bedtime. 30 tablet  0   donepezil (ARICEPT) 5 MG tablet Take 1 tablet (5 mg total) by mouth at bedtime. 30 tablet 11   Dulaglutide (TRULICITY) 5.63 SL/3.7DS SOPN Inject 0.75 mg into the skin once a week. 6 mL 3   escitalopram (LEXAPRO) 20 MG tablet Take 1 tablet (20 mg total) by mouth daily. 30 tablet 2   Evolocumab (REPATHA SURECLICK) 287 MG/ML SOAJ Inject 140 mg into the skin as directed. Inject 1 Dose into the skin every 14 (fourteen) days. 6 mL 3   ezetimibe (ZETIA) 10 MG tablet TAKE ONE TABLET BY MOUTH ONE TIME DAILY 30 tablet 4   icosapent Ethyl (VASCEPA) 1 g capsule Take 2 g by mouth 2 (two) times daily.     insulin detemir (LEVEMIR) 100 UNIT/ML injection Inject 27 Units into the skin daily.     Insulin lispro (HUMALOG JUNIOR KWIKPEN) 100 UNIT/ML Inject 0-10 Units into the skin 3 (three) times daily. insulin Lispro (Humalog) injection 0-10 Units 0-10 Units Subcutaneous, 3 times daily with meals CBG < 70: Implement Hypoglycemia Standing Orders and refer to Hypoglycemia Standing Orders sidebar report  CBG 70 - 120: 0 unit CBG 121 - 150: 0 unit  CBG 151 - 200: 1 unit CBG 201 - 250: 2 units  CBG 251 - 300: 4 units CBG 301 - 350: 6 units  CBG 351 - 400: 8 units  CBG > 400: 10 units 3 mL 3   isosorbide mononitrate (IMDUR) 30 MG 24 hr tablet TAKE ONE TABLET BY MOUTH ONE TIME DAILY 90 tablet 1   lamoTRIgine (LAMICTAL) 200 MG tablet Take 1 tablet (200 mg total) by mouth at bedtime. 30 tablet 2   metoprolol succinate (TOPROL-XL) 50 MG 24 hr tablet Take 1 tablet (50 mg total) by mouth daily. Take with or immediately following a meal. 90 tablet 3   nitroGLYCERIN (NITROSTAT) 0.4 MG SL tablet Place 1 tablet (0.4 mg total) under the tongue every 5 (five) minutes as needed for chest pain. 25 tablet 1   Oxycodone HCl 10 MG TABS Take 1 tablet (10 mg total) by mouth 2 (two) times daily as needed. 60 tablet 0   pantoprazole (PROTONIX) 40 MG tablet TAKE ONE TABLET BY MOUTH ONE TIME DAILY 30 tablet 0   pregabalin (LYRICA) 225 MG capsule TAKE ONE CAPSULE BY MOUTH TWICE A DAY 60 capsule 2   rosuvastatin (CRESTOR) 20 MG tablet Take 1 tablet (20 mg total) by mouth daily. 90 tablet 1   senna-docusate (SENOKOT-S) 8.6-50 MG tablet Take 2 tablets by mouth at bedtime. 120 tablet 0   triamcinolone cream (KENALOG) 0.1 % Apply 1 Application topically 2 (two) times daily. 30 g 0   ULTICARE MINI PEN NEEDLES 31G X 6 MM MISC USE PEN NEEDLES TWO TIMES DAILY 100 each 3   vitamin B-12 1000 MCG tablet Take 1 tablet (1,000 mcg total) by mouth daily.     Vitamin D, Ergocalciferol, (DRISDOL) 1.25 MG (50000 UNIT) CAPS capsule TAKE ONE CAPSULE BY MOUTH EVERY WEEK 5 capsule 0   No facility-administered medications prior to visit.   Review of Systems  Review of Systems  Constitutional: Negative.   Respiratory: Negative.    Cardiovascular: Negative.    Physical Exam  BP 114/70 (BP Location: Right Arm, Patient Position: Sitting, Cuff Size: Large)   Pulse 60   Temp 98.4 F (36.9 C) (Oral)   Ht '5\' 7"'$  (1.702 m)   Wt 229 lb 9.6 oz (104.1 kg)   SpO2 94% Comment: 3LPM pulsed  oxygen (POC)  BMI 35.96 kg/m  Physical  Exam Constitutional:      Appearance: Normal appearance.  HENT:     Head: Normocephalic and atraumatic.     Mouth/Throat:     Mouth: Mucous membranes are moist.     Pharynx: Oropharynx is clear.  Cardiovascular:     Rate and Rhythm: Normal rate and regular rhythm.  Pulmonary:     Effort: Pulmonary effort is normal.     Breath sounds: Normal breath sounds.     Comments: 2-3L POC Musculoskeletal:        General: Normal range of motion.  Skin:    General: Skin is warm and dry.  Neurological:     General: No focal deficit present.     Mental Status: She is alert and oriented to person, place, and time. Mental status is at baseline.  Psychiatric:        Mood and Affect: Mood normal.        Behavior: Behavior normal.        Thought Content: Thought content normal.        Judgment: Judgment normal.      Lab Results:  CBC    Component Value Date/Time   WBC 8.7 02/01/2022 0549   RBC 3.95 02/01/2022 0549   HGB 11.0 (L) 02/01/2022 0549   HGB 14.2 09/11/2021 1413   HCT 35.0 (L) 02/01/2022 0549   HCT 43.5 09/11/2021 1413   PLT 309 02/01/2022 0549   PLT 433 09/11/2021 1413   MCV 88.6 02/01/2022 0549   MCV 89 09/11/2021 1413   MCV 89 01/11/2015 1022   MCH 27.8 02/01/2022 0549   MCHC 31.4 02/01/2022 0549   RDW 15.5 02/01/2022 0549   RDW 12.8 09/11/2021 1413   RDW 13.8 01/11/2015 1022   LYMPHSABS 2.7 01/26/2022 1349   LYMPHSABS 2.6 09/11/2021 1413   LYMPHSABS 3.4 01/11/2014 1312   MONOABS 0.6 01/26/2022 1349   MONOABS 0.7 01/11/2014 1312   EOSABS 0.1 01/26/2022 1349   EOSABS 0.2 09/11/2021 1413   EOSABS 0.3 01/11/2014 1312   BASOSABS 0.0 01/26/2022 1349   BASOSABS 0.0 09/11/2021 1413   BASOSABS 0.1 01/11/2014 1312    BMET    Component Value Date/Time   NA 138 01/27/2022 0441   NA 138 09/11/2021 1413   NA 135 (L) 01/11/2015 1022   K 3.3 (L) 01/27/2022 0441   K 3.9 01/11/2015 1022   CL 105 01/27/2022 0441   CL 100 01/11/2015 1022   CO2 28 01/27/2022 0441   CO2  27 01/11/2015 1022   GLUCOSE 78 01/27/2022 0441   GLUCOSE 183 (H) 01/11/2015 1022   BUN 11 01/27/2022 0441   BUN 10 09/11/2021 1413   BUN 6 (L) 01/11/2015 1022   CREATININE 0.70 01/29/2022 0436   CREATININE 0.77 01/11/2015 1022   CALCIUM 8.5 (L) 01/27/2022 0441   CALCIUM 10.0 01/11/2015 1022   GFRNONAA >60 01/29/2022 0436   GFRNONAA >60 01/11/2015 1022   GFRNONAA 41 (L) 08/10/2014 1459   GFRAA 106 01/12/2021 0934   GFRAA >60 01/11/2015 1022   GFRAA 48 (L) 08/10/2014 1459    BNP    Component Value Date/Time   BNP 351.5 (H) 01/26/2022 1733    ProBNP No results found for: "PROBNP"  Imaging: No results found.   Assessment & Plan:   Complex sleep apnea syndrome - Predominantly obstructive sleep apnea with few centrals. OSA is mild, patient did not tolerate CPAP.  She is not a candidate for  oral appliance and apneas are not significant enough for inspire device.  Dr. Elsworth Soho recommended patient continue nocturnal oxygen and monitor symptoms. Repeat ONO on 3L oxygen.   Chronic respiratory failure with hypoxia (HCC) - Stable; O2 at rest on room air was 92% - Continue 3L oxygen at bedtime and prn during day to maintain O2 >88-90% - CXR from February 2023 without hyperinflation. Spirometry in the past showed no obstructive airway disease  - Needs pulmonary function testing scheduled to re-assess for obstructive airway disease d/t past smoking hx  - Adding humidification to home oxygen concentrator   Martyn Ehrich, NP 11/27/2022

## 2022-11-27 NOTE — Telephone Encounter (Signed)
Patient was ordered for pulmonary function testing to be done at [facility], it does not look like this has been completed.  Can we follow-up on scheduling?

## 2022-11-27 NOTE — Telephone Encounter (Signed)
Erika [name], can you check on this? PFT ordered 10/03/2022.

## 2022-11-27 NOTE — Patient Instructions (Addendum)
Recommendations:  - Continue to wear 3 L of oxygen at night and with moderate exertion. Ok to be off oxygen at rest during the day as long as SpO2 stays above 88 to 90% - We will add humidification to your oxygen at home - We will reach out to our patient care coordinator to get you scheduled for pulmonary function testing in [place]  Orders:  - Add humidification to home oxygen concentrator (ordered) - Check overnight oximetry test on 3 L (ordered)   Follow-up:  - 3 months with Dr. Patsey Berthold or sooner if needed

## 2022-11-29 NOTE — Telephone Encounter (Signed)
Rodena Piety, any update on this?

## 2022-11-29 NOTE — Telephone Encounter (Signed)
I have left a message asking the patient to call me to get her PFT scheduled

## 2022-11-30 ENCOUNTER — Ambulatory Visit (INDEPENDENT_AMBULATORY_CARE_PROVIDER_SITE_OTHER): Payer: BC Managed Care – PPO | Admitting: Nurse Practitioner

## 2022-11-30 ENCOUNTER — Encounter: Payer: Self-pay | Admitting: Nurse Practitioner

## 2022-11-30 VITALS — BP 112/70 | HR 70 | Temp 97.0°F | Ht 67.0 in | Wt 229.0 lb

## 2022-11-30 DIAGNOSIS — I10 Essential (primary) hypertension: Secondary | ICD-10-CM

## 2022-11-30 DIAGNOSIS — Z23 Encounter for immunization: Secondary | ICD-10-CM | POA: Diagnosis not present

## 2022-11-30 DIAGNOSIS — E1169 Type 2 diabetes mellitus with other specified complication: Secondary | ICD-10-CM

## 2022-11-30 DIAGNOSIS — Z951 Presence of aortocoronary bypass graft: Secondary | ICD-10-CM

## 2022-11-30 DIAGNOSIS — I2510 Atherosclerotic heart disease of native coronary artery without angina pectoris: Secondary | ICD-10-CM

## 2022-11-30 DIAGNOSIS — F1190 Opioid use, unspecified, uncomplicated: Secondary | ICD-10-CM

## 2022-11-30 DIAGNOSIS — Z794 Long term (current) use of insulin: Secondary | ICD-10-CM

## 2022-11-30 DIAGNOSIS — I214 Non-ST elevation (NSTEMI) myocardial infarction: Secondary | ICD-10-CM

## 2022-11-30 DIAGNOSIS — G43701 Chronic migraine without aura, not intractable, with status migrainosus: Secondary | ICD-10-CM

## 2022-11-30 DIAGNOSIS — J9611 Chronic respiratory failure with hypoxia: Secondary | ICD-10-CM

## 2022-11-30 DIAGNOSIS — I739 Peripheral vascular disease, unspecified: Secondary | ICD-10-CM

## 2022-11-30 DIAGNOSIS — K219 Gastro-esophageal reflux disease without esophagitis: Secondary | ICD-10-CM

## 2022-11-30 MED ORDER — DEXCOM G7 SENSOR MISC: 1.0000 | 2 refills | Status: DC

## 2022-11-30 NOTE — Progress Notes (Signed)
Established Patient Office Visit  Subjective   Patient ID: Erika [name], female    DOB: 24-Jul-1971  Age: 51 y.o. MRN: [id number]  Chief Complaint  Patient presents with   Transfer of Care    Here with MIL and daughter. Previous pt of Dr Einar Pheasant. Asking about getting Dexcom 7.    HPI  CVA: once a year. States that he rleft side is weak. States that she can ambulate with assistance. Has a hemi walker.  06/27/2021  NSTEMI: several times throughout the year.  Currently followed by cardiology Dr. Fletcher Anon and his team.  HTN: States that she does not check it at home. Have a cuff at home but does not use it.  Chronic resp failure: 3 L nasal cannula just at night. States she pulmonology  approx every 3 months.  Patient does not have CPAP nor BiPAP just nasal cannula  GERD: medication controls the hearburn.  Has not underwent endoscopy  DM2: States that she was seen by endocrine but referred to PCP. On levemir and humalog sliding scale.  Patient states she has not Trulicity well.  States she would like to see about getting [facility]  Other providers  Neurology: Dr. Felecia Shelling Cardiology: Dr [name] Pain management: Dr. Leeroy Cha Pschiatry: Berniece Andreas Pulmonology: Dr. Jeanene Erb   TDAP: 2023 Flu: needs updating PNA: need pcv 20    Review of Systems  Constitutional:  Negative for chills and fever.  Respiratory:  Negative for shortness of breath.   Cardiovascular:  Negative for chest pain.  Gastrointestinal:  Positive for nausea. Negative for abdominal pain, constipation, diarrhea and vomiting.       Daily BM  Genitourinary:  Negative for dysuria and frequency.  Neurological:  Positive for weakness and headaches. Negative for tingling.  Psychiatric/Behavioral:  Negative for hallucinations and suicidal ideas.       Objective:     BP 112/70 (BP Location: Right Arm, Patient Position: Sitting, Cuff Size: Large)   Pulse 70   Temp (!) 97 F (36.1 C)    Ht '5\' 7"'$  (1.702 m)   Wt 229 lb (103.9 kg)   SpO2 93%   BMI 35.87 kg/m    Physical Exam Vitals and nursing note reviewed.  Constitutional:      Appearance: Normal appearance.  Cardiovascular:     Rate and Rhythm: Normal rate and regular rhythm.     Pulses:          Dorsalis pedis pulses are 2+ on the right side and 2+ on the left side.     Heart sounds: Normal heart sounds.  Pulmonary:     Effort: Pulmonary effort is normal.     Breath sounds: Normal breath sounds.  Abdominal:     General: Bowel sounds are normal.  Musculoskeletal:     Right lower leg: No edema.     Left lower leg: No edema.  Feet:     Right foot:     Skin integrity: Skin integrity normal.     Left foot:     Skin integrity: Skin integrity normal.  Skin:    General: Skin is warm.  Neurological:     Mental Status: She is alert. Mental status is at baseline.     Comments: Weakness to left side.  Patient in manual wheelchair today.  States she will use a hemiwalker at home for stability working on using a quad cane but not quite there yet.  Patient has sensation to left side just no  motor movement      No results found for any visits on 11/30/22.    The ASCVD Risk score (Arnett DK, et al., 2019) failed to calculate for the following reasons:   The patient has a prior MI or stroke diagnosis    Assessment & Plan:   Problem List Items Addressed This Visit       Cardiovascular and Mediastinum   Essential hypertension, malignant (Chronic)    States had very high blood pressure resistant to medication prior strokes and strokes had better control.  Currently on Imdur and metoprolol blood pressure within normal limits.      Relevant Orders   CBC   Comprehensive metabolic panel   CAD in native artery (Chronic)    Being followed by cardiology on antilipid medications.      NSTEMI (non-ST elevated myocardial infarction) ([place])    History of several heart attacks and a CABG.  Patient currently on  antilipid medications and followed by cardiology.      PVD (peripheral vascular disease) ([place])    Patient on clopidogrel and underwent stenting in the past.      Migraine, chronic, without aura    Patient is followed by physical medicine and currently prescribed oxycodone to help with her headaches        Respiratory   Chronic respiratory failure with hypoxia ([place])    Followed by pulmonology.  3 L nasal cannula at bedtime per patient report.  Does not need CPAP nor BiPAP per her report.        Digestive   GERD (gastroesophageal reflux disease)    Patient currently maintained on Protonix 40 mg daily.  Tolerates medication well.  No EGD on file        Endocrine   Type 2 diabetes mellitus with other specified complication [facility])    Patient currently maintained on Levemir 27 units daily along with Humalog sliding scale.  Patient is using a CGM new prescription sent in today.  Patient also on Trulicity 0.98 mg.  Tolerates well but states that the needle is quite painful interested in switching to [facility].  Pending labs today.  Did have discussion if going on Mounjaro and titrating up would likely have to titrate down on her insulin pending A1c.  Patient has had no recent hypoglycemia      Relevant Medications   Continuous Blood Gluc Sensor (DEXCOM G7 SENSOR) MISC   Other Relevant Orders   Hemoglobin A1c     Other   S/P CABG x 4 (Chronic)    Followed by cardiology and on antilipid medications.      Opiate use (Chronic)    PMR manages oxycodone.      Other Visit Diagnoses     Need for influenza vaccination    -  Primary   Relevant Orders   Flu Vaccine QUAD 6+ mos PF IM (Fluarix Quad PF) (Completed)   Need for pneumococcal 20-valent conjugate vaccination       Relevant Orders   Pneumococcal conjugate vaccine 20-valent (Prevnar 20) (Completed)       Return in about 4 months (around 04/01/2023).    Romilda Garret, NP

## 2022-11-30 NOTE — Assessment & Plan Note (Signed)
Patient on clopidogrel and underwent stenting in the past.

## 2022-11-30 NOTE — Assessment & Plan Note (Signed)
History of several heart attacks and a CABG.  Patient currently on antilipid medications and followed by cardiology.

## 2022-11-30 NOTE — Assessment & Plan Note (Signed)
Patient is followed by physical medicine and currently prescribed oxycodone to help with her headaches

## 2022-11-30 NOTE — Assessment & Plan Note (Signed)
Being followed by cardiology on antilipid medications.

## 2022-11-30 NOTE — Assessment & Plan Note (Signed)
Patient currently maintained on Levemir 27 units daily along with Humalog sliding scale.  Patient is using a CGM new prescription sent in today.  Patient also on Trulicity 3.95 mg.  Tolerates well but states that the needle is quite painful interested in switching to [facility].  Pending labs today.  Did have discussion if going on Mounjaro and titrating up would likely have to titrate down on her insulin pending A1c.  Patient has had no recent hypoglycemia

## 2022-11-30 NOTE — Assessment & Plan Note (Signed)
Patient currently maintained on Protonix 40 mg daily.  Tolerates medication well.  No EGD on file

## 2022-11-30 NOTE — Assessment & Plan Note (Signed)
Followed by cardiology and on antilipid medications.

## 2022-11-30 NOTE — Assessment & Plan Note (Signed)
Followed by pulmonology.  3 L nasal cannula at bedtime per patient report.  Does not need CPAP nor BiPAP per her report.

## 2022-11-30 NOTE — Assessment & Plan Note (Signed)
PMR manages oxycodone.

## 2022-11-30 NOTE — Assessment & Plan Note (Signed)
States had very high blood pressure resistant to medication prior strokes and strokes had better control.  Currently on Imdur and metoprolol blood pressure within normal limits.

## 2022-11-30 NOTE — Patient Instructions (Signed)
Nice to see you today I will be in touch with the labs once I have them Follow up with me in 4 months sooner if you need me

## 2022-12-01 LAB — CBC
HCT: 43.3 % (ref 35.0–45.0)
Hemoglobin: 14 g/dL (ref 11.7–15.5)
MCH: 28.4 pg (ref 27.0–33.0)
MCHC: 32.3 g/dL (ref 32.0–36.0)
MCV: 87.8 fL (ref 80.0–100.0)
MPV: 11.5 fL (ref 7.5–12.5)
Platelets: 280 10*3/uL (ref 140–400)
RBC: 4.93 10*6/uL (ref 3.80–5.10)
RDW: 12.8 % (ref 11.0–15.0)
WBC: 6.4 10*3/uL (ref 3.8–10.8)

## 2022-12-01 LAB — COMPREHENSIVE METABOLIC PANEL
AG Ratio: 1.6 (calc) (ref 1.0–2.5)
ALT: 35 U/L — ABNORMAL HIGH (ref 6–29)
AST: 24 U/L (ref 10–35)
Albumin: 3.9 g/dL (ref 3.6–5.1)
Alkaline phosphatase (APISO): 118 U/L (ref 31–125)
BUN: 11 mg/dL (ref 7–25)
CO2: 28 mmol/L (ref 20–32)
Calcium: 9.8 mg/dL (ref 8.6–10.2)
Chloride: 97 mmol/L — ABNORMAL LOW (ref 98–110)
Creat: 0.81 mg/dL (ref 0.50–0.99)
Globulin: 2.5 g/dL (calc) (ref 1.9–3.7)
Glucose, Bld: 503 mg/dL (ref 65–99)
Potassium: 4.5 mmol/L (ref 3.5–5.3)
Sodium: 134 mmol/L — ABNORMAL LOW (ref 135–146)
Total Bilirubin: 0.2 mg/dL (ref 0.2–1.2)
Total Protein: 6.4 g/dL (ref 6.1–8.1)

## 2022-12-01 LAB — HEMOGLOBIN A1C
Hgb A1c MFr Bld: 11.8 % of total Hgb — ABNORMAL HIGH (ref <5.7)
Mean Plasma Glucose: 292 mg/dL
eAG (mmol/L): 16.2 mmol/L

## 2022-12-03 ENCOUNTER — Other Ambulatory Visit: Payer: Self-pay | Admitting: Nurse Practitioner

## 2022-12-03 ENCOUNTER — Telehealth: Payer: Self-pay

## 2022-12-03 ENCOUNTER — Telehealth: Payer: Self-pay | Admitting: Nurse Practitioner

## 2022-12-03 DIAGNOSIS — Z794 Long term (current) use of insulin: Secondary | ICD-10-CM

## 2022-12-03 MED ORDER — TIRZEPATIDE 2.5 MG/0.5ML ~~LOC~~ SOAJ: 2.5000 mg | SUBCUTANEOUS | 0 refills | Status: DC

## 2022-12-03 NOTE — Telephone Encounter (Signed)
Per lab result note Romilda Garret NP is aware of 503 BS and Kelly CMA left v/m requesting pt to cb. Sending note to Romilda Garret NP and [place] pool.

## 2022-12-03 NOTE — Telephone Encounter (Signed)
Patient called back in returning a call she received.  

## 2022-12-03 NOTE — Telephone Encounter (Signed)
[place] Night - Client TELEPHONE ADVICE RECORD [facility] Patient Name: Erika [name] Gender: Female DOB: [birth year] Age: 51 Y 11 M 29 D Return Phone Number: [phone] (Primary) Address: City/ State/ Zip: [place] Client [place] Night - Client Client Site [place] Type Call Who Is Calling Lab / Radiology Lab Name [facility] Phone Number [phone] Lab Tech Name Diane Lab Reference Number [id number] Y Chief Complaint Lab Result (Critical or Stat) Call Type Lab Send to RN Reason for Call Report lab results Initial Comment Caller states she has a critical lab. Translation No Nurse Assessment Nurse: Ardine Bjork, RN, Melissa Date/Time (Eastern Time): 12/01/2022 1:46:00 AM Is there an on-call provider listed? ---Yes Please list name of person reporting value (Lab Employee) and a contact number. ---Spoke with: Tawny Hopping Please document the following items: Lab name Lab value (read back to lab to verify) Reference range for lab value Date and time blood was drawn Collect time of birth for bilirubin results ---Date/Time Collection: 11-30-22 2:58pm Name of Lab: Glucose Lab Result: Glucose 503- verified with repeat analysis Previous Lab: None Ordered by: Karl Ito Please collect the patient contact information from the lab. (name, phone number and address) ---Contact: [phone] Other Contact: None Disp. Time Eilene Ghazi Time) Disposition Final User 12/01/2022 2:12:03 AM Clinical Call Zayas, RN, Lenna Sciara 12/01/2022 2:12:37 AM Send To RN Personal Zayas, RN, Melissa 12/01/2022 2:13:15 AM Send To RN Personal Zayas, RN, Melissa 12/01/2022 3:02:44 AM Attempt made - message left Oval Linsey, RN, Sarah 12/01/2022 3:42:26 AM FINAL ATTEMPT MADE - no message left Linnell Fulling 12/01/2022 4:58:58 AM Send To RN Personal Oval Linsey, RN, Sarah 12/01/2022 5:05:59 AM Attempt made - no message  left Oren Bracket 12/01/2022 5:06:25 AM FINAL ATTEMPT MADE - no message left Oren Bracket 12/01/2022 5:06:55 AM FINAL ATTEMPT MADE - no message left Oren Bracket 12/01/2022 5:25:18 AM Send To RN Personal Hardin Negus, RN, Harlon Flor NOTE: All timestamps contained within this report are represented as [place] Standard Time. CONFIDENTIALTY NOTICE: This fax transmission is intended only for the addressee. It contains information that is legally privileged, confidential or otherwise protected from use or disclosure. If you are not the intended recipient, you are strictly prohibited from reviewing, disclosing, copying using or disseminating any of this information or taking any action in reliance on or regarding this information. If you have received this fax in error, please notify us immediately by telephone so that we can arrange for its return to [name]. Phone: [phone], Toll-Free: [phone], Fax: [phone] Page: 2 of 2 Call Id: [id number] [place]. Time Eilene Ghazi Time) Disposition Final User 12/01/2022 6:40:49 AM FINAL ATTEMPT MADE - message left Yes Hardin Negus RN, Mardene Celeste Final Disposition 12/01/2022 6:40:49 AM FINAL ATTEMPT MADE - message left Yes Hardin Negus, RN, Mardene Celeste Comments User: Guinevere Ferrari, RN Date/Time Eilene Ghazi Time): 12/01/2022 3:42:55 AM attempted to reach patient regarding high blood sugar. Chart will be sent to RN final attempt q and we will try again later. User: Ledora Bottcher, RN Date/Time Eilene Ghazi Time): 12/01/2022 5:06:50 AM '@506'$  attempt made, no V

## 2022-12-03 NOTE — Telephone Encounter (Signed)
Reviewed in labs and sent message to pool for staff to reach out to patient

## 2022-12-04 DIAGNOSIS — G4733 Obstructive sleep apnea (adult) (pediatric): Secondary | ICD-10-CM | POA: Diagnosis not present

## 2022-12-05 NOTE — Telephone Encounter (Signed)
Erika [name], can we follow up on this?

## 2022-12-07 DIAGNOSIS — G4733 Obstructive sleep apnea (adult) (pediatric): Secondary | ICD-10-CM | POA: Diagnosis not present

## 2022-12-07 NOTE — Telephone Encounter (Signed)
I have left another message asking the patient to call back to schedule the PFT

## 2022-12-10 ENCOUNTER — Encounter: Payer: Self-pay | Admitting: Registered Nurse

## 2022-12-10 ENCOUNTER — Encounter: Payer: BC Managed Care – PPO | Attending: Physical Medicine and Rehabilitation | Admitting: Registered Nurse

## 2022-12-10 VITALS — BP 134/78 | HR 74 | Ht 67.0 in | Wt 226.0 lb

## 2022-12-10 DIAGNOSIS — Z79899 Other long term (current) drug therapy: Secondary | ICD-10-CM

## 2022-12-10 DIAGNOSIS — Z5181 Encounter for therapeutic drug level monitoring: Secondary | ICD-10-CM | POA: Diagnosis not present

## 2022-12-10 DIAGNOSIS — G44029 Chronic cluster headache, not intractable: Secondary | ICD-10-CM | POA: Diagnosis not present

## 2022-12-10 DIAGNOSIS — G894 Chronic pain syndrome: Secondary | ICD-10-CM | POA: Diagnosis not present

## 2022-12-10 MED ORDER — OXYCODONE HCL 10 MG PO TABS: 10.0000 mg | ORAL_TABLET | Freq: Two times a day (BID) | ORAL | 0 refills | Status: DC | PRN

## 2022-12-10 NOTE — Progress Notes (Signed)
Subjective:    Patient ID: Erika [name], female    DOB: 1971-09-02, 51 y.o.   MRN: [id number]  HPI: Erika [name] is a 51 y.o. female who returns for follow up appointment for chronic pain and medication refill. She reports she has a chronic headache. She rates her pain 7. Her current exercise regime is walking with her hemi walker at home and she arrived in wheelchair.   [name] reports she dropped some of her medication in recliner, and was unable to find it. We discussed where she should keep her medication and where to administer the medication. She verbalizes understanding.   [name] Morphine equivalent is 30.00 MME.  She is also prescribed clonazepam by Dr. Adele Schilder .We have discussed the black box warning of using opioids and benzodiazepines. I highlighted the dangers of using these drugs together and discussed the adverse events including respiratory suppression, overdose, cognitive impairment and importance of compliance with current regimen. We will continue to monitor and adjust as indicated.  she is being closely monitored and under the care of her psychiatrist.   Last UDS was Performed on 08/09/2022, see note for details.    Pain Inventory Average Pain 9 Pain Right Now 7 My pain is constant and aching  In the last 24 hours, has pain interfered with the following? General activity 8 Relation with others 6 Enjoyment of life 6 What TIME of day is your pain at its worst? evening Sleep (in general) Poor  Pain is worse with: inactivity and standing Pain improves with: medication Relief from Meds: 5  Family History  Adopted: Yes  Problem Relation Age of Onset   Diabetes Mother    Diabetes Father    Alcohol abuse Father    Heart disease Father    Drug abuse Father    Stroke Sister    Anxiety disorder Sister    Social History   Socioeconomic History   Marital status: Married    Spouse name: Not on file   Number of children: 1   Years of  education: 14   Highest education level: Not on file  Occupational History   Occupation: Disability  Tobacco Use   Smoking status: Former    Packs/day: 0.50    Years: 30.00    Total pack years: 15.00    Types: Cigarettes    Quit date: 2022    Years since quitting: 1.9    Passive exposure: Never   Smokeless tobacco: Never  Vaping Use   Vaping Use: Every day   Substances: Flavoring  Substance and Sexual Activity   Alcohol use: Not Currently    Alcohol/week: 0.0 standard drinks of alcohol    Comment: socially   Drug use: No   Sexual activity: Yes    Partners: Male    Birth control/protection: None  Other Topics Concern   Not on file  Social History Narrative   11/21/20   From: [name]   Living: with husband, [name] (2009) and special needs daughter    Work: disability       Family: daughter - [name] (special needs, lives with her) and 2 grown step children - [name] and [name]       Enjoys: play with dogs - 2 german Shepard, mut, and blue tick walker mix      Exercise: PAD limits exercise   Diet: diabetic diet and low potassium due to daughter      Safety   Seat belts: Yes  Guns: Yes  and secure   Safe in relationships: Yes    Social Determinants of Health   Financial Resource Strain: Not on file  Food Insecurity: Not on file  Transportation Needs: Not on file  Physical Activity: Not on file  Stress: Not on file  Social Connections: Not on file   Past Surgical History:  Procedure Laterality Date   ABDOMINAL AORTOGRAM W/LOWER EXTREMITY [name] 10/15/2018   Procedure: ABDOMINAL AORTOGRAM W/LOWER EXTREMITY;  Surgeon: Wellington Hampshire, MD;  Location: [place] [facility];  Service: Cardiovascular;  Laterality: [name];   ABDOMINAL AORTOGRAM W/LOWER EXTREMITY Bilateral 08/19/2019   Procedure: ABDOMINAL AORTOGRAM W/LOWER EXTREMITY;  Surgeon: Wellington Hampshire, MD;  Location: [place] [facility];  Service: Cardiovascular;  Laterality: Bilateral;   CARDIAC  CATHETERIZATION [name] 06/29/2016   Procedure: Left Heart Cath and Coronary Angiography;  Surgeon: Minna Merritts, MD;  Location: [place] [facility];  Service: Cardiovascular;  Laterality: [name];   CARDIAC CATHETERIZATION [name] 08/29/2016   Procedure: Left Heart Cath and Cors/Grafts Angiography;  Surgeon: Wellington Hampshire, MD;  Location: [place] [facility];  Service: Cardiovascular;  Laterality: [name];   CESAREAN SECTION     CHOLECYSTECTOMY     CORONARY ARTERY BYPASS GRAFT [name] 07/06/2016   Procedure: CORONARY ARTERY BYPASS GRAFTING (CABG) x four, using left internal mammary artery and right leg greater saphenous vein harvested endoscopically;  Surgeon: Ivin Poot, MD;  Location: [place];  Service: Open Heart Surgery;  Laterality: [name];   ENDARTERECTOMY Right 07/06/2016   Procedure: ENDARTERECTOMY CAROTID;  Surgeon: Rosetta Posner, MD;  Location: [facility] OR;  Service: Vascular;  Laterality: Right;   ENDARTERECTOMY Right 04/27/2020   Procedure: REDO OF RIGHT ENDARTERECTOMY CAROTID;  Surgeon: Rosetta Posner, MD;  Location: [facility];  Service: Vascular;  Laterality: Right;   LEFT HEART CATH AND CORS/GRAFTS ANGIOGRAPHY [name] 08/24/2020   Procedure: LEFT HEART CATH AND CORS/GRAFTS ANGIOGRAPHY;  Surgeon: Wellington Hampshire, MD;  Location: [place] [facility];  Service: Cardiovascular;  Laterality: [name];   LEFT HEART CATH AND CORS/GRAFTS ANGIOGRAPHY [name] 01/29/2022   Procedure: LEFT HEART CATH AND CORS/GRAFTS ANGIOGRAPHY;  Surgeon: Wellington Hampshire, MD;  Location: [place] [facility];  Service: Cardiovascular;  Laterality: [name];   PERIPHERAL VASCULAR CATHETERIZATION [name] 04/18/2016   Procedure: Renal Angiography;  Surgeon: Wellington Hampshire, MD;  Location: [place] [facility];  Service: Cardiovascular;  Laterality: [name];   PERIPHERAL VASCULAR INTERVENTION Left 10/15/2018   Procedure: PERIPHERAL VASCULAR INTERVENTION;  Surgeon: Wellington Hampshire, MD;  Location: [place] [facility];  Service: Cardiovascular;  Laterality: Left;  Left  superficial femoral   TEE WITHOUT CARDIOVERSION [name] 07/06/2016   Procedure: TRANSESOPHAGEAL ECHOCARDIOGRAM (TEE);  Surgeon: Ivin Poot, MD;  Location: [place];  Service: Open Heart Surgery;  Laterality: [name];   TONSILLECTOMY     Past Surgical History:  Procedure Laterality Date   ABDOMINAL AORTOGRAM W/LOWER EXTREMITY [name] 10/15/2018   Procedure: ABDOMINAL AORTOGRAM W/LOWER EXTREMITY;  Surgeon: Wellington Hampshire, MD;  Location: [place] [facility];  Service: Cardiovascular;  Laterality: [name];   ABDOMINAL AORTOGRAM W/LOWER EXTREMITY Bilateral 08/19/2019   Procedure: ABDOMINAL AORTOGRAM W/LOWER EXTREMITY;  Surgeon: Wellington Hampshire, MD;  Location: [place] [facility];  Service: Cardiovascular;  Laterality: Bilateral;   CARDIAC CATHETERIZATION [name] 06/29/2016   Procedure: Left Heart Cath and Coronary Angiography;  Surgeon: Minna [place]itts, MD;  Location: [place] [facility];  Service: Cardiovascular;  Laterality: [name];   CARDIAC CATHETERIZATION  [name] 08/29/2016   Procedure: Left Heart Cath and Cors/Grafts Angiography;  Surgeon: Wellington Hampshire, MD;  Location: [place] [facility];  Service: Cardiovascular;  Laterality: [name];   CESAREAN SECTION     CHOLECYSTECTOMY     CORONARY ARTERY BYPASS GRAFT [name] 07/06/2016   Procedure: CORONARY ARTERY BYPASS GRAFTING (CABG) x four, using left internal mammary artery and right leg greater saphenous vein harvested endoscopically;  Surgeon: Ivin Poot, MD;  Location: [place];  Service: Open Heart Surgery;  Laterality: [name];   ENDARTERECTOMY Right 07/06/2016   Procedure: ENDARTERECTOMY CAROTID;  Surgeon: Rosetta Posner, MD;  Location: [facility] OR;  Service: Vascular;  Laterality: Right;   ENDARTERECTOMY Right 04/27/2020   Procedure: REDO OF RIGHT ENDARTERECTOMY CAROTID;  Surgeon: Rosetta Posner, MD;  Location: [facility] OR;  Service: Vascular;  Laterality: Right;   LEFT HEART CATH AND CORS/GRAFTS ANGIOGRAPHY [name] 08/24/2020   Procedure: LEFT HEART CATH AND CORS/GRAFTS ANGIOGRAPHY;  Surgeon:  Wellington Hampshire, MD;  Location: [place] [facility];  Service: Cardiovascular;  Laterality: [name];   LEFT HEART CATH AND CORS/GRAFTS ANGIOGRAPHY [name] 01/29/2022   Procedure: LEFT HEART CATH AND CORS/GRAFTS ANGIOGRAPHY;  Surgeon: Wellington Hampshire, MD;  Location: [place] [facility];  Service: Cardiovascular;  Laterality: [name];   PERIPHERAL VASCULAR CATHETERIZATION [name] 04/18/2016   Procedure: Renal Angiography;  Surgeon: Wellington Hampshire, MD;  Location: [place] [facility];  Service: Cardiovascular;  Laterality: [name];   PERIPHERAL VASCULAR INTERVENTION Left 10/15/2018   Procedure: PERIPHERAL VASCULAR INTERVENTION;  Surgeon: Wellington Hampshire, MD;  Location: [place] [facility];  Service: Cardiovascular;  Laterality: Left;  Left superficial femoral   TEE WITHOUT CARDIOVERSION [name] 07/06/2016   Procedure: TRANSESOPHAGEAL ECHOCARDIOGRAM (TEE);  Surgeon: Ivin Poot, MD;  Location: [place];  Service: Open Heart Surgery;  Laterality: [name];   TONSILLECTOMY     Past Medical History:  Diagnosis Date   Acute respiratory failure with hypoxia (HCC) 06/24/2021   Arthralgia of temporomandibular joint    CAD, multiple vessel    a. 06/2016 Cath: ostLM 40%, ostLAD 40%, pLAD 95%, ost-pLCx 60%, pLCx 95%, mLCx 60%, mRCA 95%, D2 50%, LVSF nl;  b. 07/2016 CABG x 4 (LIMA->LAD, VG->Diag, VG->OM, VG->RCA); c. 08/2016 Cath: 3VD w/ 4/4 patent grafts. LAD distal to LIMA has diff dzs->Med rx; d. 08/2020 Cath: 4/4 patent grafts, native 3VD. EF 55-65%-->Med Rx.   Carotid arterial disease ([place])    a. 07/2016 s/p R CEA; b. 02/2021 U/S: RICA 14-48%, LICA 1-85%.   Cerebrovascular disease    Clotting disorder ([place])    Complex sleep apnea syndrome 10/03/2022   Depression    Diastolic dysfunction    a. 06/2016 Echo: EF 50-55%, mild inf wall HK, GR1DD, mild MR, RV sys fxn nl, mildly dilated LA, PASP nl; b. 06/2021 Echo: EF 60-65%, no rwma. Nl RV fxn.   Fatty liver disease, nonalcoholic 6314   History of blood transfusion    with heart surgery    HLD (hyperlipidemia)    Labile hypertension    a. prior renal ngiogram negative for RAS in 03/2016; b. catecholamines and metanephrines normal, mildly elevated renin with normal aldosterone and normal ratio in 02/2016   Myocardial infarction [facility]) 2017   Obesity    PAD (peripheral artery disease) ([place])    a. 09/2018 s/p L SFA stenting; b. 07/2019 Periph Angio: Patent m/d L SFA stent w/ 100% L SFA distal to stent. L AT 100d, L Peroneal diff dzs-->Med Rx; c. 02/2021  ABIs: stable @ 0.61 on R and 0.46 on L.   PTSD (post-traumatic stress disorder)    Stroke [facility])    Tobacco abuse    Type 2 diabetes mellitus ([place]) 12/2015   Ht '5\' 7"'$  (1.702 m)   Wt 226 lb (102.5 kg)   BMI 35.40 kg/m   Opioid Risk Score:   Fall Risk Score:  `1  Depression screen [facility] 2/9     12/10/2022    8:51 AM 11/30/2022    2:13 PM 10/09/2022    2:17 PM 08/09/2022   10:49 AM 11/10/2021    1:25 PM 09/06/2021    2:55 PM 08/09/2021   11:16 AM  Depression screen PHQ 2/9  Decreased Interest 0 0 0 '1 1 3 2  '$ Down, Depressed, Hopeless 0 3 0 '1 1 3 3  '$ PHQ - 2 Score 0 3 0 '2 2 6 5  '$ Altered sleeping  3     0  Tired, decreased energy  3     3  Change in appetite  3     2  Feeling bad or failure about yourself   3     3  Trouble concentrating  0     3  Moving slowly or fidgety/restless  0     2  Suicidal thoughts  0     0  PHQ-9 Score  15     18  Difficult doing work/chores  Extremely dIfficult     Very difficult    Review of Systems  Neurological:  Positive for headaches.  All other systems reviewed and are negative.      Objective:   Physical Exam Vitals and nursing note reviewed.  Constitutional:      Appearance: Normal appearance.  Cardiovascular:     Rate and Rhythm: Normal rate and regular rhythm.     Pulses: Normal pulses.     Heart sounds: Normal heart sounds.  Pulmonary:     Effort: Pulmonary effort is normal.     Breath sounds: Normal breath sounds.  Musculoskeletal:     Cervical back: Normal range of  motion and neck supple.     Comments: Normal Muscle Bulk and Muscle Testing Reveals:  Upper Extremities: Right: Full ROM and Muscle Strength 5/5 Left Upper Extremity: Paralysis  Lower Extremities: Right: Full ROM and Muscle Strength 5/5 Left Lower Extremity: Decreased ROM and Muscle Strength 5/5 Arrived in Wheelchair     Skin:    General: Skin is warm and dry.  Neurological:     General: No focal deficit present.     Mental Status: She is alert and oriented to person, place, and time.  Psychiatric:        Mood and Affect: Mood normal.        Behavior: Behavior normal.         Assessment & Plan:    Cerebrovascular Accident: Neurology Following. Continue current medication regimen. Continue to Monitor. 12/10/2022 Cluster Headache: She is scheduled for Botox with Dr Ranell Patrick. R3. Bilateral Lower Extremities Pain:No complaints today.  Continue HEP as Tolerated. Continue to Monitor. 12/10/2022 Chronic Pain Refilled: Oxycodone 10 mg one tablet twice a day as needed for pain #60. We will continue the opioid monitoring program, this consists of regular clinic visits, examinations, urine drug screen, pill counts as well as use of [place] Controlled Substance Reporting system. A 12 month History has been reviewed on the [place] on 12/10/2022   Neuropathy: Continue Lyrica. Continue  to Monitor. 12/10/2022  5. Insomnia: Continue Amitriptyline. Continue to Monitor. 12/10/2022   F/U in 1 month

## 2022-12-11 DIAGNOSIS — S8252XD Displaced fracture of medial malleolus of left tibia, subsequent encounter for closed fracture with routine healing: Secondary | ICD-10-CM | POA: Diagnosis not present

## 2022-12-13 NOTE — Progress Notes (Signed)
Agree with the details of the visit as noted by Elizabeth Walsh, NP.  C. Laura Judith Demps, MD [facility] 

## 2022-12-17 ENCOUNTER — Encounter: Payer: Self-pay | Admitting: Registered Nurse

## 2022-12-26 ENCOUNTER — Other Ambulatory Visit: Payer: Self-pay | Admitting: Physical Medicine and Rehabilitation

## 2022-12-26 ENCOUNTER — Other Ambulatory Visit: Payer: Self-pay | Admitting: Nurse Practitioner

## 2023-01-02 ENCOUNTER — Other Ambulatory Visit: Payer: Self-pay | Admitting: Nurse Practitioner

## 2023-01-02 ENCOUNTER — Other Ambulatory Visit (HOSPITAL_COMMUNITY): Payer: Self-pay | Admitting: Psychiatry

## 2023-01-02 DIAGNOSIS — E1169 Type 2 diabetes mellitus with other specified complication: Secondary | ICD-10-CM

## 2023-01-02 DIAGNOSIS — F4310 Post-traumatic stress disorder, unspecified: Secondary | ICD-10-CM

## 2023-01-02 DIAGNOSIS — F331 Major depressive disorder, recurrent, moderate: Secondary | ICD-10-CM

## 2023-01-02 MED ORDER — TIRZEPATIDE 5 MG/0.5ML ~~LOC~~ SOAJ: 5.0000 mg | SUBCUTANEOUS | 0 refills | Status: DC

## 2023-01-02 NOTE — Progress Notes (Signed)
Orders only

## 2023-01-04 DIAGNOSIS — G4733 Obstructive sleep apnea (adult) (pediatric): Secondary | ICD-10-CM | POA: Diagnosis not present

## 2023-01-07 DIAGNOSIS — G4733 Obstructive sleep apnea (adult) (pediatric): Secondary | ICD-10-CM | POA: Diagnosis not present

## 2023-01-10 ENCOUNTER — Other Ambulatory Visit: Payer: Self-pay | Admitting: Nurse Practitioner

## 2023-01-10 DIAGNOSIS — E1169 Type 2 diabetes mellitus with other specified complication: Secondary | ICD-10-CM

## 2023-01-11 ENCOUNTER — Encounter
Payer: BC Managed Care – PPO | Attending: Physical Medicine and Rehabilitation | Admitting: Physical Medicine and Rehabilitation

## 2023-01-11 VITALS — BP 128/82 | HR 97 | Ht 67.0 in | Wt 226.0 lb

## 2023-01-11 DIAGNOSIS — I63411 Cerebral infarction due to embolism of right middle cerebral artery: Secondary | ICD-10-CM | POA: Insufficient documentation

## 2023-01-11 DIAGNOSIS — S8252XD Displaced fracture of medial malleolus of left tibia, subsequent encounter for closed fracture with routine healing: Secondary | ICD-10-CM | POA: Diagnosis not present

## 2023-01-11 DIAGNOSIS — G43909 Migraine, unspecified, not intractable, without status migrainosus: Secondary | ICD-10-CM | POA: Insufficient documentation

## 2023-01-11 DIAGNOSIS — G44029 Chronic cluster headache, not intractable: Secondary | ICD-10-CM | POA: Diagnosis not present

## 2023-01-11 DIAGNOSIS — G629 Polyneuropathy, unspecified: Secondary | ICD-10-CM | POA: Insufficient documentation

## 2023-01-11 MED ORDER — PREGABALIN 225 MG PO CAPS: 225.0000 mg | ORAL_CAPSULE | Freq: Two times a day (BID) | ORAL | 2 refills | Status: DC

## 2023-01-11 MED ORDER — CYCLOBENZAPRINE HCL 10 MG PO TABS: 10.0000 mg | ORAL_TABLET | Freq: Every day | ORAL | 0 refills | Status: DC

## 2023-01-11 MED ORDER — ONABOTULINUMTOXINA 100 UNITS IJ SOLR
300.0000 [IU] | Freq: Once | INTRAMUSCULAR | Status: AC
  Administered 2023-01-11: 300 [IU] via INTRAMUSCULAR

## 2023-01-11 MED ORDER — OXYCODONE HCL 10 MG PO TABS: 10.0000 mg | ORAL_TABLET | Freq: Two times a day (BID) | ORAL | 0 refills | Status: DC | PRN

## 2023-01-11 NOTE — Progress Notes (Signed)
Botox Injection for chronic migraine headaches   Dilution: 100 Units/ 71m preservative free NS x2 Indication: refractory headaches (At least 15 days per month/headache lasting greater than 4 hours per day) incompletely responsive to other more conservative measures.  Informed consent was obtained after describing risks and benefits of the procedure with the patient. This includes bleeding, bruising, infection, excessive weakness, or medication side effects. A REMS form is on file and signed. Needle: 30g 1/2 inch needle    Number of units per muscle:  Right temporalis 20 units, 4 access points Left temporalis 20 units,  4 access points Right frontalis 10 units, 2 access points Left frontalis 10 units, 2 access points Procerus 5 units, 1 access point Right corrugator 5 units, 1 access point Left corrugator 5 units, 1 access point Right occipitalis 15 units, 3 access points Left occipitalis 15 units, 3 access points Right cervical paraspinal 10 units, 2 access points Left cervical paraspinals 10 units, 2 access points  Right trapezius 15 units, 3 access points Left trapezius 15 units, 3 access points   Remaining 45 units was discarded  Additional 100 U was injected into left biceps muscle, 50U each in 2 locations    All injections were done after  after negative drawback for blood. The patient tolerated the procedure well. Post procedure instructions were given. A followup appointment was made.   Prescribed Zynex Nexwave heating/cooling blanket

## 2023-01-14 ENCOUNTER — Telehealth (HOSPITAL_BASED_OUTPATIENT_CLINIC_OR_DEPARTMENT_OTHER): Payer: BC Managed Care – PPO | Admitting: Psychiatry

## 2023-01-14 ENCOUNTER — Encounter (HOSPITAL_COMMUNITY): Payer: Self-pay | Admitting: Psychiatry

## 2023-01-14 VITALS — Wt 226.0 lb

## 2023-01-14 DIAGNOSIS — F4310 Post-traumatic stress disorder, unspecified: Secondary | ICD-10-CM

## 2023-01-14 DIAGNOSIS — F331 Major depressive disorder, recurrent, moderate: Secondary | ICD-10-CM | POA: Diagnosis not present

## 2023-01-14 DIAGNOSIS — R4189 Other symptoms and signs involving cognitive functions and awareness: Secondary | ICD-10-CM | POA: Diagnosis not present

## 2023-01-14 MED ORDER — CLONAZEPAM 0.25 MG PO TBDP: 0.2500 mg | ORAL_TABLET | Freq: Two times a day (BID) | ORAL | 2 refills | Status: DC

## 2023-01-14 MED ORDER — CHLORPROMAZINE HCL 50 MG PO TABS: 50.0000 mg | ORAL_TABLET | Freq: Every day | ORAL | 2 refills | Status: DC

## 2023-01-14 MED ORDER — LAMOTRIGINE 200 MG PO TABS: 200.0000 mg | ORAL_TABLET | Freq: Every day | ORAL | 2 refills | Status: DC

## 2023-01-14 MED ORDER — ESCITALOPRAM OXALATE 20 MG PO TABS: 20.0000 mg | ORAL_TABLET | Freq: Every day | ORAL | 2 refills | Status: DC
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
## 2023-01-18 DIAGNOSIS — K219 Gastro-esophageal reflux disease without esophagitis: Secondary | ICD-10-CM | POA: Diagnosis not present

## 2023-01-18 DIAGNOSIS — Z7985 Long-term (current) use of injectable non-insulin antidiabetic drugs: Secondary | ICD-10-CM | POA: Diagnosis not present

## 2023-01-18 DIAGNOSIS — Z9981 Dependence on supplemental oxygen: Secondary | ICD-10-CM | POA: Diagnosis not present

## 2023-01-18 DIAGNOSIS — I10 Essential (primary) hypertension: Secondary | ICD-10-CM | POA: Diagnosis not present

## 2023-01-18 DIAGNOSIS — I69354 Hemiplegia and hemiparesis following cerebral infarction affecting left non-dominant side: Secondary | ICD-10-CM | POA: Diagnosis not present

## 2023-01-18 DIAGNOSIS — G43709 Chronic migraine without aura, not intractable, without status migrainosus: Secondary | ICD-10-CM | POA: Diagnosis not present

## 2023-01-18 DIAGNOSIS — I2510 Atherosclerotic heart disease of native coronary artery without angina pectoris: Secondary | ICD-10-CM | POA: Diagnosis not present

## 2023-01-18 DIAGNOSIS — Z7902 Long term (current) use of antithrombotics/antiplatelets: Secondary | ICD-10-CM | POA: Diagnosis not present

## 2023-01-18 DIAGNOSIS — Z951 Presence of aortocoronary bypass graft: Secondary | ICD-10-CM | POA: Diagnosis not present

## 2023-01-18 DIAGNOSIS — Z794 Long term (current) use of insulin: Secondary | ICD-10-CM | POA: Diagnosis not present

## 2023-01-18 DIAGNOSIS — M21372 Foot drop, left foot: Secondary | ICD-10-CM | POA: Diagnosis not present

## 2023-01-18 DIAGNOSIS — I252 Old myocardial infarction: Secondary | ICD-10-CM | POA: Diagnosis not present

## 2023-01-18 DIAGNOSIS — E1151 Type 2 diabetes mellitus with diabetic peripheral angiopathy without gangrene: Secondary | ICD-10-CM | POA: Diagnosis not present

## 2023-01-18 DIAGNOSIS — Z79891 Long term (current) use of opiate analgesic: Secondary | ICD-10-CM | POA: Diagnosis not present

## 2023-01-18 DIAGNOSIS — J9610 Chronic respiratory failure, unspecified whether with hypoxia or hypercapnia: Secondary | ICD-10-CM | POA: Diagnosis not present

## 2023-01-18 DIAGNOSIS — Z79899 Other long term (current) drug therapy: Secondary | ICD-10-CM | POA: Diagnosis not present

## 2023-01-21 DIAGNOSIS — Z79899 Other long term (current) drug therapy: Secondary | ICD-10-CM | POA: Diagnosis not present

## 2023-01-21 DIAGNOSIS — I2510 Atherosclerotic heart disease of native coronary artery without angina pectoris: Secondary | ICD-10-CM | POA: Diagnosis not present

## 2023-01-21 DIAGNOSIS — I69354 Hemiplegia and hemiparesis following cerebral infarction affecting left non-dominant side: Secondary | ICD-10-CM | POA: Diagnosis not present

## 2023-01-21 DIAGNOSIS — I252 Old myocardial infarction: Secondary | ICD-10-CM | POA: Diagnosis not present

## 2023-01-21 DIAGNOSIS — E1151 Type 2 diabetes mellitus with diabetic peripheral angiopathy without gangrene: Secondary | ICD-10-CM | POA: Diagnosis not present

## 2023-01-21 DIAGNOSIS — Z7902 Long term (current) use of antithrombotics/antiplatelets: Secondary | ICD-10-CM | POA: Diagnosis not present

## 2023-01-21 DIAGNOSIS — Z9981 Dependence on supplemental oxygen: Secondary | ICD-10-CM | POA: Diagnosis not present

## 2023-01-21 DIAGNOSIS — K219 Gastro-esophageal reflux disease without esophagitis: Secondary | ICD-10-CM | POA: Diagnosis not present

## 2023-01-21 DIAGNOSIS — M21372 Foot drop, left foot: Secondary | ICD-10-CM | POA: Diagnosis not present

## 2023-01-21 DIAGNOSIS — Z7985 Long-term (current) use of injectable non-insulin antidiabetic drugs: Secondary | ICD-10-CM | POA: Diagnosis not present

## 2023-01-21 DIAGNOSIS — Z79891 Long term (current) use of opiate analgesic: Secondary | ICD-10-CM | POA: Diagnosis not present

## 2023-01-21 DIAGNOSIS — J9610 Chronic respiratory failure, unspecified whether with hypoxia or hypercapnia: Secondary | ICD-10-CM | POA: Diagnosis not present

## 2023-01-21 DIAGNOSIS — I10 Essential (primary) hypertension: Secondary | ICD-10-CM | POA: Diagnosis not present

## 2023-01-21 DIAGNOSIS — Z951 Presence of aortocoronary bypass graft: Secondary | ICD-10-CM | POA: Diagnosis not present

## 2023-01-21 DIAGNOSIS — Z794 Long term (current) use of insulin: Secondary | ICD-10-CM | POA: Diagnosis not present

## 2023-01-21 DIAGNOSIS — G43709 Chronic migraine without aura, not intractable, without status migrainosus: Secondary | ICD-10-CM | POA: Diagnosis not present

## 2023-01-22 ENCOUNTER — Telehealth: Payer: Self-pay | Admitting: Physical Medicine and Rehabilitation

## 2023-01-22 NOTE — Telephone Encounter (Signed)
Requesting Verbal orders home health PT  Possible drug interaction when review for medication. Oxycodone and another medication unable to clearly understand rest of voicemail message.  Please call therapist back

## 2023-01-23 DIAGNOSIS — Z7985 Long-term (current) use of injectable non-insulin antidiabetic drugs: Secondary | ICD-10-CM | POA: Diagnosis not present

## 2023-01-23 DIAGNOSIS — G43709 Chronic migraine without aura, not intractable, without status migrainosus: Secondary | ICD-10-CM | POA: Diagnosis not present

## 2023-01-23 DIAGNOSIS — E1151 Type 2 diabetes mellitus with diabetic peripheral angiopathy without gangrene: Secondary | ICD-10-CM | POA: Diagnosis not present

## 2023-01-23 DIAGNOSIS — I252 Old myocardial infarction: Secondary | ICD-10-CM | POA: Diagnosis not present

## 2023-01-23 DIAGNOSIS — Z79891 Long term (current) use of opiate analgesic: Secondary | ICD-10-CM | POA: Diagnosis not present

## 2023-01-23 DIAGNOSIS — M21372 Foot drop, left foot: Secondary | ICD-10-CM | POA: Diagnosis not present

## 2023-01-23 DIAGNOSIS — Z951 Presence of aortocoronary bypass graft: Secondary | ICD-10-CM | POA: Diagnosis not present

## 2023-01-23 DIAGNOSIS — Z794 Long term (current) use of insulin: Secondary | ICD-10-CM | POA: Diagnosis not present

## 2023-01-23 DIAGNOSIS — Z79899 Other long term (current) drug therapy: Secondary | ICD-10-CM | POA: Diagnosis not present

## 2023-01-23 DIAGNOSIS — K219 Gastro-esophageal reflux disease without esophagitis: Secondary | ICD-10-CM | POA: Diagnosis not present

## 2023-01-23 DIAGNOSIS — Z9981 Dependence on supplemental oxygen: Secondary | ICD-10-CM | POA: Diagnosis not present

## 2023-01-23 DIAGNOSIS — I2510 Atherosclerotic heart disease of native coronary artery without angina pectoris: Secondary | ICD-10-CM | POA: Diagnosis not present

## 2023-01-23 DIAGNOSIS — J9610 Chronic respiratory failure, unspecified whether with hypoxia or hypercapnia: Secondary | ICD-10-CM | POA: Diagnosis not present

## 2023-01-23 DIAGNOSIS — I69354 Hemiplegia and hemiparesis following cerebral infarction affecting left non-dominant side: Secondary | ICD-10-CM | POA: Diagnosis not present

## 2023-01-23 DIAGNOSIS — I10 Essential (primary) hypertension: Secondary | ICD-10-CM | POA: Diagnosis not present

## 2023-01-23 DIAGNOSIS — Z7902 Long term (current) use of antithrombotics/antiplatelets: Secondary | ICD-10-CM | POA: Diagnosis not present

## 2023-01-25 NOTE — Telephone Encounter (Signed)
Approved and given PT orders.

## 2023-01-29 ENCOUNTER — Other Ambulatory Visit: Payer: Self-pay | Admitting: Physical Medicine and Rehabilitation

## 2023-01-30 ENCOUNTER — Telehealth: Payer: Self-pay

## 2023-01-30 NOTE — Telephone Encounter (Signed)
Prior Authorization initiated by [email or web address]  KEY: [id number]  Response: Your information has been submitted to [employer]. [employer] is reviewing the PA request and you will receive an electronic response. You may check for the updated outcome later by reopening this request. The standard fax determination will also be sent to you directly.  If you have any questions about your PA submission, contact [employer] at [phone].

## 2023-01-31 DIAGNOSIS — Z79891 Long term (current) use of opiate analgesic: Secondary | ICD-10-CM | POA: Diagnosis not present

## 2023-01-31 DIAGNOSIS — Z794 Long term (current) use of insulin: Secondary | ICD-10-CM | POA: Diagnosis not present

## 2023-01-31 DIAGNOSIS — E1151 Type 2 diabetes mellitus with diabetic peripheral angiopathy without gangrene: Secondary | ICD-10-CM | POA: Diagnosis not present

## 2023-01-31 DIAGNOSIS — M21372 Foot drop, left foot: Secondary | ICD-10-CM | POA: Diagnosis not present

## 2023-01-31 DIAGNOSIS — I10 Essential (primary) hypertension: Secondary | ICD-10-CM | POA: Diagnosis not present

## 2023-01-31 DIAGNOSIS — Z7985 Long-term (current) use of injectable non-insulin antidiabetic drugs: Secondary | ICD-10-CM | POA: Diagnosis not present

## 2023-01-31 DIAGNOSIS — I2510 Atherosclerotic heart disease of native coronary artery without angina pectoris: Secondary | ICD-10-CM | POA: Diagnosis not present

## 2023-01-31 DIAGNOSIS — Z951 Presence of aortocoronary bypass graft: Secondary | ICD-10-CM | POA: Diagnosis not present

## 2023-01-31 DIAGNOSIS — Z7902 Long term (current) use of antithrombotics/antiplatelets: Secondary | ICD-10-CM | POA: Diagnosis not present

## 2023-01-31 DIAGNOSIS — J9610 Chronic respiratory failure, unspecified whether with hypoxia or hypercapnia: Secondary | ICD-10-CM | POA: Diagnosis not present

## 2023-01-31 DIAGNOSIS — Z79899 Other long term (current) drug therapy: Secondary | ICD-10-CM | POA: Diagnosis not present

## 2023-01-31 DIAGNOSIS — G43709 Chronic migraine without aura, not intractable, without status migrainosus: Secondary | ICD-10-CM | POA: Diagnosis not present

## 2023-01-31 DIAGNOSIS — I69354 Hemiplegia and hemiparesis following cerebral infarction affecting left non-dominant side: Secondary | ICD-10-CM | POA: Diagnosis not present

## 2023-01-31 DIAGNOSIS — K219 Gastro-esophageal reflux disease without esophagitis: Secondary | ICD-10-CM | POA: Diagnosis not present

## 2023-01-31 DIAGNOSIS — Z9981 Dependence on supplemental oxygen: Secondary | ICD-10-CM | POA: Diagnosis not present

## 2023-01-31 DIAGNOSIS — I252 Old myocardial infarction: Secondary | ICD-10-CM | POA: Diagnosis not present

## 2023-02-04 ENCOUNTER — Other Ambulatory Visit: Payer: Self-pay | Admitting: Physical Medicine and Rehabilitation

## 2023-02-04 DIAGNOSIS — G4733 Obstructive sleep apnea (adult) (pediatric): Secondary | ICD-10-CM | POA: Diagnosis not present

## 2023-02-04 NOTE — Telephone Encounter (Signed)
Call placed to Ms. Marts, she's taking the cyclobenzaprine. Cyclobenzaprine ordered, she verbalizes understanding.

## 2023-02-07 DIAGNOSIS — G4733 Obstructive sleep apnea (adult) (pediatric): Secondary | ICD-10-CM | POA: Diagnosis not present

## 2023-02-08 ENCOUNTER — Ambulatory Visit: Payer: Managed Care, Other (non HMO) | Admitting: Physical Medicine and Rehabilitation

## 2023-02-08 DIAGNOSIS — Z79899 Other long term (current) drug therapy: Secondary | ICD-10-CM | POA: Diagnosis not present

## 2023-02-08 DIAGNOSIS — M21372 Foot drop, left foot: Secondary | ICD-10-CM | POA: Diagnosis not present

## 2023-02-08 DIAGNOSIS — K219 Gastro-esophageal reflux disease without esophagitis: Secondary | ICD-10-CM | POA: Diagnosis not present

## 2023-02-08 DIAGNOSIS — Z951 Presence of aortocoronary bypass graft: Secondary | ICD-10-CM | POA: Diagnosis not present

## 2023-02-08 DIAGNOSIS — I2510 Atherosclerotic heart disease of native coronary artery without angina pectoris: Secondary | ICD-10-CM | POA: Diagnosis not present

## 2023-02-08 DIAGNOSIS — Z9981 Dependence on supplemental oxygen: Secondary | ICD-10-CM | POA: Diagnosis not present

## 2023-02-08 DIAGNOSIS — E1151 Type 2 diabetes mellitus with diabetic peripheral angiopathy without gangrene: Secondary | ICD-10-CM | POA: Diagnosis not present

## 2023-02-08 DIAGNOSIS — G43709 Chronic migraine without aura, not intractable, without status migrainosus: Secondary | ICD-10-CM | POA: Diagnosis not present

## 2023-02-08 DIAGNOSIS — Z7902 Long term (current) use of antithrombotics/antiplatelets: Secondary | ICD-10-CM | POA: Diagnosis not present

## 2023-02-08 DIAGNOSIS — J9610 Chronic respiratory failure, unspecified whether with hypoxia or hypercapnia: Secondary | ICD-10-CM | POA: Diagnosis not present

## 2023-02-08 DIAGNOSIS — I69354 Hemiplegia and hemiparesis following cerebral infarction affecting left non-dominant side: Secondary | ICD-10-CM | POA: Diagnosis not present

## 2023-02-08 DIAGNOSIS — Z79891 Long term (current) use of opiate analgesic: Secondary | ICD-10-CM | POA: Diagnosis not present

## 2023-02-08 DIAGNOSIS — I252 Old myocardial infarction: Secondary | ICD-10-CM | POA: Diagnosis not present

## 2023-02-08 DIAGNOSIS — Z7985 Long-term (current) use of injectable non-insulin antidiabetic drugs: Secondary | ICD-10-CM | POA: Diagnosis not present

## 2023-02-08 DIAGNOSIS — Z794 Long term (current) use of insulin: Secondary | ICD-10-CM | POA: Diagnosis not present

## 2023-02-08 DIAGNOSIS — I10 Essential (primary) hypertension: Secondary | ICD-10-CM | POA: Diagnosis not present

## 2023-02-11 ENCOUNTER — Telehealth: Payer: Self-pay | Admitting: Registered Nurse

## 2023-02-11 ENCOUNTER — Encounter: Payer: BC Managed Care – PPO | Admitting: Registered Nurse

## 2023-02-11 ENCOUNTER — Other Ambulatory Visit: Payer: Self-pay | Admitting: Nurse Practitioner

## 2023-02-11 DIAGNOSIS — S8252XD Displaced fracture of medial malleolus of left tibia, subsequent encounter for closed fracture with routine healing: Secondary | ICD-10-CM | POA: Diagnosis not present

## 2023-02-11 DIAGNOSIS — G44029 Chronic cluster headache, not intractable: Secondary | ICD-10-CM

## 2023-02-11 DIAGNOSIS — E1169 Type 2 diabetes mellitus with other specified complication: Secondary | ICD-10-CM

## 2023-02-11 MED ORDER — OXYCODONE HCL 10 MG PO TABS: 10.0000 mg | ORAL_TABLET | Freq: Two times a day (BID) | ORAL | 0 refills | Status: DC | PRN

## 2023-02-11 NOTE — Telephone Encounter (Signed)
Rx written and sent to the pharmacy. Thanks!

## 2023-02-11 NOTE — Telephone Encounter (Signed)
Patient called back to reschedule appt today with Eunice--she will be out of medication as of today.  Please call patient once this has been sent to pharmacy.

## 2023-02-11 NOTE — Addendum Note (Signed)
Addended by: [name] T on: 02/11/2023 02:27 PM   Modules accepted: Orders

## 2023-03-02 ENCOUNTER — Other Ambulatory Visit (HOSPITAL_COMMUNITY): Payer: Self-pay | Admitting: Psychiatry

## 2023-03-02 ENCOUNTER — Other Ambulatory Visit: Payer: Self-pay | Admitting: Physical Medicine and Rehabilitation

## 2023-03-02 DIAGNOSIS — F4310 Post-traumatic stress disorder, unspecified: Secondary | ICD-10-CM

## 2023-03-02 DIAGNOSIS — F331 Major depressive disorder, recurrent, moderate: Secondary | ICD-10-CM

## 2023-03-04 NOTE — Telephone Encounter (Signed)
Call Placed to Ms. Bento , she doesn't need a refill on her cyclobenzaprine. Also sttes her PCP placed the order.

## 2023-03-05 ENCOUNTER — Ambulatory Visit (INDEPENDENT_AMBULATORY_CARE_PROVIDER_SITE_OTHER)
Admission: RE | Admit: 2023-03-05 | Discharge: 2023-03-05 | Disposition: A | Discharge status: Home / Self Care | Payer: BC Managed Care – PPO | Source: Ambulatory Visit | Attending: Nurse Practitioner | Admitting: Nurse Practitioner

## 2023-03-05 ENCOUNTER — Ambulatory Visit: Payer: BC Managed Care – PPO | Admitting: Nurse Practitioner

## 2023-03-05 ENCOUNTER — Telehealth: Payer: Self-pay | Admitting: *Deleted

## 2023-03-05 ENCOUNTER — Encounter: Payer: Self-pay | Admitting: Registered Nurse

## 2023-03-05 ENCOUNTER — Encounter: Payer: BC Managed Care – PPO | Attending: Physical Medicine and Rehabilitation | Admitting: Registered Nurse

## 2023-03-05 ENCOUNTER — Encounter: Payer: Self-pay | Admitting: Nurse Practitioner

## 2023-03-05 VITALS — BP 108/78 | HR 103 | Temp 98.0°F | Resp 16 | Ht 67.0 in | Wt 212.5 lb

## 2023-03-05 VITALS — BP 136/81 | HR 120 | Ht 67.0 in | Wt 212.8 lb

## 2023-03-05 DIAGNOSIS — R0689 Other abnormalities of breathing: Secondary | ICD-10-CM | POA: Insufficient documentation

## 2023-03-05 DIAGNOSIS — G4733 Obstructive sleep apnea (adult) (pediatric): Secondary | ICD-10-CM | POA: Diagnosis not present

## 2023-03-05 DIAGNOSIS — R0902 Hypoxemia: Secondary | ICD-10-CM

## 2023-03-05 DIAGNOSIS — G894 Chronic pain syndrome: Secondary | ICD-10-CM | POA: Diagnosis not present

## 2023-03-05 DIAGNOSIS — I5032 Chronic diastolic (congestive) heart failure: Secondary | ICD-10-CM

## 2023-03-05 DIAGNOSIS — Z5181 Encounter for therapeutic drug level monitoring: Secondary | ICD-10-CM | POA: Diagnosis not present

## 2023-03-05 DIAGNOSIS — G44029 Chronic cluster headache, not intractable: Secondary | ICD-10-CM | POA: Insufficient documentation

## 2023-03-05 DIAGNOSIS — R0989 Other specified symptoms and signs involving the circulatory and respiratory systems: Secondary | ICD-10-CM | POA: Diagnosis not present

## 2023-03-05 DIAGNOSIS — Z79899 Other long term (current) drug therapy: Secondary | ICD-10-CM | POA: Diagnosis not present

## 2023-03-05 DIAGNOSIS — E1169 Type 2 diabetes mellitus with other specified complication: Secondary | ICD-10-CM | POA: Diagnosis not present

## 2023-03-05 DIAGNOSIS — Z794 Long term (current) use of insulin: Secondary | ICD-10-CM | POA: Diagnosis not present

## 2023-03-05 DIAGNOSIS — I10 Essential (primary) hypertension: Secondary | ICD-10-CM

## 2023-03-05 DIAGNOSIS — R000 Tachycardia, unspecified: Secondary | ICD-10-CM | POA: Diagnosis not present

## 2023-03-05 LAB — POCT GLYCOSYLATED HEMOGLOBIN (HGB A1C): Hemoglobin A1C: 8.2 % — AB (ref 4.0–5.6)

## 2023-03-05 MED ORDER — TIRZEPATIDE 7.5 MG/0.5ML ~~LOC~~ SOAJ: 7.5000 mg | SUBCUTANEOUS | 0 refills | Status: DC

## 2023-03-05 MED ORDER — OXYCODONE HCL 10 MG PO TABS: 10.0000 mg | ORAL_TABLET | Freq: Two times a day (BID) | ORAL | 0 refills | Status: DC | PRN

## 2023-03-05 NOTE — Assessment & Plan Note (Addendum)
Patient's blood pressure under well-controlled today in office.  Currently maintained on metoprolol, isosorbide.  Continue medication as prescribed

## 2023-03-05 NOTE — Patient Instructions (Signed)
Send a My- Chart message in two weeks with update on your right shoulder pain

## 2023-03-05 NOTE — Telephone Encounter (Signed)
Formal warning for taking more oxycodone than prescribed. Letter given at appointment 03/05/23.

## 2023-03-05 NOTE — Progress Notes (Signed)
Established Patient Office Visit  Subjective   Patient ID: Erika [name], female    DOB: 07-29-1971  Age: 52 y.o. MRN: [id number]  Chief Complaint  Patient presents with   Diabetes      DM2: Patient currently maintained on Levemir, sliding scale insulin, Mounjaro.  Patient's last A1c was 11.8%.  Patient does have a continuous glucose monitor. States that she is tolerating the mounjaro well. No constipated  and some nausea  Dexcom limit for low is set at 60 and went off one time States that the highest readings have been lower 300s and she will take insulin.  States that her portions have been over halfed. Still eating 3 times a day. States that she has been drinking mountain dew zero   HTN: Patient currently maintained on metoprolol, isosorbide mononitrate. Denies lightheadness/ dizziness    Cough: state athta it started 2 days ago States that it just happened. States that yesterday she was nauseous. States that she felt the shortness of breath is better today     Review of Systems  Constitutional:  Negative for chills and fever.  Respiratory:  Positive for shortness of breath.   Cardiovascular:  Negative for chest pain.  Gastrointestinal:  Positive for nausea. Negative for abdominal pain, constipation, diarrhea and vomiting.  Neurological:  Negative for headaches.      Objective:     BP 108/78   Pulse (!) 103   Temp 98 F (36.7 C)   Resp 16   Ht '5\' 7"'$  (1.702 m)   Wt 212 lb 8 oz (96.4 kg)   SpO2 (!) 88%   BMI 33.28 kg/m  BP Readings from Last 3 Encounters:  03/05/23 108/78  01/11/23 128/82  12/10/22 134/78   Wt Readings from Last 3 Encounters:  03/05/23 212 lb 8 oz (96.4 kg)  01/11/23 226 lb (102.5 kg)  12/10/22 226 lb (102.5 kg)      Physical Exam Vitals and nursing note reviewed.  Constitutional:      Appearance: Normal appearance.  Cardiovascular:     Rate and Rhythm: Normal rate and regular rhythm.     Heart sounds: Normal heart sounds.   Pulmonary:     Effort: Pulmonary effort is normal.     Breath sounds: Rales present.  Neurological:     Mental Status: She is alert.      Results for orders placed or performed in visit on 03/05/23  POCT glycosylated hemoglobin (Hb A1C)  Result Value Ref Range   Hemoglobin A1C 8.2 (A) 4.0 - 5.6 %   HbA1c POC (<> result, manual entry)     HbA1c, POC (prediabetic range)     HbA1c, POC (controlled diabetic range)        The ASCVD Risk score (Arnett DK, et al., 2019) failed to calculate for the following reasons:   The patient has a prior MI or stroke diagnosis    Assessment & Plan:   Problem List Items Addressed This Visit       Cardiovascular and Mediastinum   Essential hypertension, malignant (Chronic)    Patient's blood pressure under well-controlled today in office.  Currently maintained on metoprolol, isosorbide.  Continue medication as prescribed      Chronic diastolic CHF (congestive heart failure) ([place])    Patient did have crackles to lower lobes of lung.  No other signs of overload pending chest x-ray.  Patient has lost weight since last office visit        Respiratory  Hypoxia    Patient's O2 saturation today was 88% in office.  Does have at home oxygen if needed.  She has used since yesterday no acute concerning signs in office.  Pending chest x-ray      Relevant Orders   DG Chest 2 View     Endocrine   Type 2 diabetes mellitus with other specified complication ([place]) - Primary    Patient is A1c trended down.  Patient currently maintained on Mounjaro, Levemir, sliding scale insulin.  Will titrate patient's Mounjaro from 5 mg to 7.5 mg weekly.  Tolerating it well at this juncture patient does have a CGM to monitor her glucose      Relevant Medications   tirzepatide (MOUNJARO) 7.5 MG/0.5ML Pen   Other Relevant Orders   POCT glycosylated hemoglobin (Hb A1C) (Completed)     Other   Adventitious breath sounds    Rales appreciated on right lower lobe  pending chest x-ray.      Relevant Orders   DG Chest 2 View    Return in about 3 months (around 06/05/2023) for DM recheck.    Romilda Garret, NP

## 2023-03-05 NOTE — Progress Notes (Unsigned)
Subjective:    Patient ID: Erika [name], female    DOB: 1971/04/26, 52 y.o.   MRN: [id number]  HPI: Erika [name] is a 52 y.o. female who returns for follow up appointment for chronic pain and medication refill. states *** pain is located in  ***. rates pain ***. current exercise regime is walking and performing stretching exercises.  Erika [name] Morphine equivalent is 30.00 MME.   Oral Swab was Performed today.     Pain Inventory Average Pain 9 Pain Right Now 9 My pain is aching  In the last 24 hours, has pain interfered with the following? General activity 8 Relation with others 6 Enjoyment of life 6 What TIME of day is your pain at its worst? evening Sleep (in general) Poor  Pain is worse with: unsure Pain improves with: medication Relief from Meds:  not answered  Family History  Adopted: Yes  Problem Relation Age of Onset   Diabetes Mother    Diabetes Father    Alcohol abuse Father    Heart disease Father    Drug abuse Father    Stroke Sister    Anxiety disorder Sister    Social History   Socioeconomic History   Marital status: Married    Spouse name: Not on file   Number of children: 1   Years of education: 14   Highest education level: Not on file  Occupational History   Occupation: Disability  Tobacco Use   Smoking status: Former    Packs/day: 0.50    Years: 30.00    Total pack years: 15.00    Types: Cigarettes    Quit date: 2022    Years since quitting: 2.1    Passive exposure: Never   Smokeless tobacco: Never  Vaping Use   Vaping Use: Every day   Substances: Flavoring  Substance and Sexual Activity   Alcohol use: Not Currently    Alcohol/week: 0.0 standard drinks of alcohol    Comment: socially   Drug use: No   Sexual activity: Yes    Partners: Male    Birth control/protection: None  Other Topics Concern   Not on file  Social History Narrative   11/21/20   From: [name]   Living: with husband, [name] (2009)  and special needs daughter    Work: disability       Family: daughter - [name] (special needs, lives with her) and 2 grown step children - [name] and [name]       Enjoys: play with dogs - 2 german Shepard, mut, and blue tick walker mix      Exercise: PAD limits exercise   Diet: diabetic diet and low potassium due to daughter      Safety   Seat belts: Yes    Guns: Yes  and secure   Safe in relationships: Yes    Social Determinants of Health   Financial Resource Strain: Not on file  Food Insecurity: Not on file  Transportation Needs: Not on file  Physical Activity: Not on file  Stress: Not on file  Social Connections: Not on file   Past Surgical History:  Procedure Laterality Date   ABDOMINAL AORTOGRAM W/LOWER EXTREMITY [name] 10/15/2018   Procedure: ABDOMINAL AORTOGRAM W/LOWER EXTREMITY;  Surgeon: Wellington Hampshire, MD;  Location: [place] [facility];  Service: Cardiovascular;  Laterality: [name];   ABDOMINAL AORTOGRAM W/LOWER EXTREMITY Bilateral 08/19/2019   Procedure: ABDOMINAL AORTOGRAM W/LOWER EXTREMITY;  Surgeon: Wellington Hampshire, MD;  Location: [place]  [facility];  Service: Cardiovascular;  Laterality: Bilateral;   CARDIAC CATHETERIZATION [name] 06/29/2016   Procedure: Left Heart Cath and Coronary Angiography;  Surgeon: Minna Merritts, MD;  Location: [place] [facility];  Service: Cardiovascular;  Laterality: [name];   CARDIAC CATHETERIZATION [name] 08/29/2016   Procedure: Left Heart Cath and Cors/Grafts Angiography;  Surgeon: Wellington Hampshire, MD;  Location: [place] [facility];  Service: Cardiovascular;  Laterality: [name];   CESAREAN SECTION     CHOLECYSTECTOMY     CORONARY ARTERY BYPASS GRAFT [name] 07/06/2016   Procedure: CORONARY ARTERY BYPASS GRAFTING (CABG) x four, using left internal mammary artery and right leg greater saphenous vein harvested endoscopically;  Surgeon: Ivin Poot, MD;  Location: [place];  Service: Open Heart Surgery;  Laterality: [name];   ENDARTERECTOMY Right 07/06/2016    Procedure: ENDARTERECTOMY CAROTID;  Surgeon: Rosetta Posner, MD;  Location: [facility] OR;  Service: Vascular;  Laterality: Right;   ENDARTERECTOMY Right 04/27/2020   Procedure: REDO OF RIGHT ENDARTERECTOMY CAROTID;  Surgeon: Rosetta Posner, MD;  Location: [facility];  Service: Vascular;  Laterality: Right;   LEFT HEART CATH AND CORS/GRAFTS ANGIOGRAPHY [name] 08/24/2020   Procedure: LEFT HEART CATH AND CORS/GRAFTS ANGIOGRAPHY;  Surgeon: Wellington Hampshire, MD;  Location: [place] [facility];  Service: Cardiovascular;  Laterality: [name];   LEFT HEART CATH AND CORS/GRAFTS ANGIOGRAPHY [name] 01/29/2022   Procedure: LEFT HEART CATH AND CORS/GRAFTS ANGIOGRAPHY;  Surgeon: Wellington Hampshire, MD;  Location: [place] [facility];  Service: Cardiovascular;  Laterality: [name];   PERIPHERAL VASCULAR CATHETERIZATION [name] 04/18/2016   Procedure: Renal Angiography;  Surgeon: Wellington Hampshire, MD;  Location: [place] [facility];  Service: Cardiovascular;  Laterality: [name];   PERIPHERAL VASCULAR INTERVENTION Left 10/15/2018   Procedure: PERIPHERAL VASCULAR INTERVENTION;  Surgeon: Wellington Hampshire, MD;  Location: [place] [facility];  Service: Cardiovascular;  Laterality: Left;  Left superficial femoral   TEE WITHOUT CARDIOVERSION [name] 07/06/2016   Procedure: TRANSESOPHAGEAL ECHOCARDIOGRAM (TEE);  Surgeon: Ivin Poot, MD;  Location: [place];  Service: Open Heart Surgery;  Laterality: [name];   TONSILLECTOMY     Past Surgical History:  Procedure Laterality Date   ABDOMINAL AORTOGRAM W/LOWER EXTREMITY [name] 10/15/2018   Procedure: ABDOMINAL AORTOGRAM W/LOWER EXTREMITY;  Surgeon: Wellington Hampshire, MD;  Location: [place] [facility];  Service: Cardiovascular;  Laterality: [name];   ABDOMINAL AORTOGRAM W/LOWER EXTREMITY Bilateral 08/19/2019   Procedure: ABDOMINAL AORTOGRAM W/LOWER EXTREMITY;  Surgeon: Wellington Hampshire, MD;  Location: [place] [facility];  Service: Cardiovascular;  Laterality: Bilateral;   CARDIAC CATHETERIZATION [name] 06/29/2016   Procedure: Left  Heart Cath and Coronary Angiography;  Surgeon: Minna Merritts, MD;  Location: [place] [facility];  Service: Cardiovascular;  Laterality: [name];   CARDIAC CATHETERIZATION [name] 08/29/2016   Procedure: Left Heart Cath and Cors/Grafts Angiography;  Surgeon: Wellington Hampshire, MD;  Location: [place] [facility];  Service: Cardiovascular;  Laterality: [name];   CESAREAN SECTION     CHOLECYSTECTOMY     CORONARY ARTERY BYPASS GRAFT [name] 07/06/2016   Procedure: CORONARY ARTERY BYPASS GRAFTING (CABG) x four, using left internal mammary artery and right leg greater saphenous vein harvested endoscopically;  Surgeon: Ivin Poot, MD;  Location: [place];  Service: Open Heart Surgery;  Laterality: [name];   ENDARTERECTOMY Right 07/06/2016   Procedure: ENDARTERECTOMY CAROTID;  Surgeon: Rosetta Posner, MD;  Location: [place];  Service: Vascular;  Laterality: Right;   ENDARTERECTOMY Right 04/27/2020  Procedure: REDO OF RIGHT ENDARTERECTOMY CAROTID;  Surgeon: Rosetta Posner, MD;  Location: [facility] OR;  Service: Vascular;  Laterality: Right;   LEFT HEART CATH AND CORS/GRAFTS ANGIOGRAPHY [name] 08/24/2020   Procedure: LEFT HEART CATH AND CORS/GRAFTS ANGIOGRAPHY;  Surgeon: Wellington Hampshire, MD;  Location: [place] [facility];  Service: Cardiovascular;  Laterality: [name];   LEFT HEART CATH AND CORS/GRAFTS ANGIOGRAPHY [name] 01/29/2022   Procedure: LEFT HEART CATH AND CORS/GRAFTS ANGIOGRAPHY;  Surgeon: Wellington Hampshire, MD;  Location: [place] [facility];  Service: Cardiovascular;  Laterality: [name];   PERIPHERAL VASCULAR CATHETERIZATION [name] 04/18/2016   Procedure: Renal Angiography;  Surgeon: Wellington Hampshire, MD;  Location: [place] [facility];  Service: Cardiovascular;  Laterality: [name];   PERIPHERAL VASCULAR INTERVENTION Left 10/15/2018   Procedure: PERIPHERAL VASCULAR INTERVENTION;  Surgeon: Wellington Hampshire, MD;  Location: [place] [facility];  Service: Cardiovascular;  Laterality: Left;  Left superficial femoral   TEE WITHOUT CARDIOVERSION [name]  07/06/2016   Procedure: TRANSESOPHAGEAL ECHOCARDIOGRAM (TEE);  Surgeon: Ivin Poot, MD;  Location: [place];  Service: Open Heart Surgery;  Laterality: [name];   TONSILLECTOMY     Past Medical History:  Diagnosis Date   Acute respiratory failure with hypoxia (HCC) 06/24/2021   Arthralgia of temporomandibular joint    CAD, multiple vessel    a. 06/2016 Cath: ostLM 40%, ostLAD 40%, pLAD 95%, ost-pLCx 60%, pLCx 95%, mLCx 60%, mRCA 95%, D2 50%, LVSF nl;  b. 07/2016 CABG x 4 (LIMA->LAD, VG->Diag, VG->OM, VG->RCA); c. 08/2016 Cath: 3VD w/ 4/4 patent grafts. LAD distal to LIMA has diff dzs->Med rx; d. 08/2020 Cath: 4/4 patent grafts, native 3VD. EF 55-65%-->Med Rx.   Carotid arterial disease ([place])    a. 07/2016 s/p R CEA; b. 02/2021 U/S: RICA 123456, LICA 123456.   Cerebrovascular disease    Clotting disorder ([place])    Complex sleep apnea syndrome 10/03/2022   Depression    Diastolic dysfunction    a. 06/2016 Echo: EF 50-55%, mild inf wall HK, GR1DD, mild MR, RV sys fxn nl, mildly dilated LA, PASP nl; b. 06/2021 Echo: EF 60-65%, no rwma. Nl RV fxn.   Fatty liver disease, nonalcoholic [id number]   History of blood transfusion    with heart surgery   HLD (hyperlipidemia)    Labile hypertension    a. prior renal ngiogram negative for RAS in 03/2016; b. catecholamines and metanephrines normal, mildly elevated renin with normal aldosterone and normal ratio in 02/2016   Myocardial infarction [facility]) 2017   Obesity    PAD (peripheral artery disease) ([place])    a. 09/2018 s/p L SFA stenting; b. 07/2019 Periph Angio: Patent m/d L SFA stent w/ 100% L SFA distal to stent. L AT 100d, L Peroneal diff dzs-->Med Rx; c. 02/2021 ABIs: stable @ 0.61 on R and 0.46 on L.   PTSD (post-traumatic stress disorder)    Stroke [facility])    Tobacco abuse    Type 2 diabetes mellitus ([place]) 12/2015   BP 136/81   Pulse (!) 122   Ht '5\' 7"'$  (1.702 m)   Wt 212 lb 12.8 oz (96.5 kg)   SpO2 (!) 87%   BMI 33.33 kg/m   Opioid Risk Score:   Fall Risk  Score:  `1  Depression screen PHQ 2/9     03/05/2023    1:21 PM 03/05/2023   10:13 AM 12/10/2022    8:51 AM 11/30/2022    2:13 PM 10/09/2022    2:17 PM 08/09/2022  10:49 AM 11/10/2021    1:25 PM  Depression screen PHQ 2/9  Decreased Interest 3 0 0 0 0 1 1  Down, Depressed, Hopeless 3 3 0 3 0 1 1  PHQ - 2 Score 6 3 0 3 0 2 2  Altered sleeping  3  3     Tired, decreased energy  3  3     Change in appetite  0  3     Feeling bad or failure about yourself   3  3     Trouble concentrating  1  0     Moving slowly or fidgety/restless  1  0     Suicidal thoughts  0  0     PHQ-9 Score  14  15     Difficult doing work/chores  Somewhat difficult  Extremely dIfficult        Review of Systems  Constitutional: Negative.   HENT: Negative.    Eyes: Negative.   Respiratory: Negative.    Cardiovascular: Negative.   Gastrointestinal: Negative.   Endocrine: Negative.   Genitourinary: Negative.   Musculoskeletal:  Positive for gait problem and myalgias.  Skin: Negative.   Allergic/Immunologic: Negative.   Hematological: Negative.   Psychiatric/Behavioral:  Positive for dysphoric mood.   All other systems reviewed and are negative.      Objective:   Physical Exam        Assessment & Plan:    Cerebrovascular Accident: Neurology Following. Continue current medication regimen. Continue to Monitor. 12/10/2022 Cluster Headache: She is scheduled for Botox with Dr Ranell Patrick. R3. Bilateral Lower Extremities Pain:No complaints today.  Continue HEP as Tolerated. Continue to Monitor. 12/10/2022 Chronic Pain Refilled: Oxycodone 10 mg one tablet twice a day as needed for pain #60. We will continue the opioid monitoring program, this consists of regular clinic visits, examinations, urine drug screen, pill counts as well as use of [place] Controlled Substance Reporting system. A 12 month History has been reviewed on the [place] on 12/10/2022    Neuropathy: Continue Lyrica. Continue to Monitor. 12/10/2022  5. Insomnia: Continue Amitriptyline. Continue to Monitor. 12/10/2022   F/U in 1 month

## 2023-03-05 NOTE — Assessment & Plan Note (Signed)
Rales appreciated on right lower lobe pending chest x-ray.

## 2023-03-05 NOTE — Assessment & Plan Note (Signed)
Patient's O2 saturation today was 88% in office.  Does have at home oxygen if needed.  She has used since yesterday no acute concerning signs in office.  Pending chest x-ray

## 2023-03-05 NOTE — Patient Instructions (Signed)
Nice to see you today I increased the mounjaro to 7.'5mg'$  weekly I will be in touch with the xray once I have the results Follow up with me in 3 months, sooner if you need me

## 2023-03-05 NOTE — Assessment & Plan Note (Signed)
Patient did have crackles to lower lobes of lung.  No other signs of overload pending chest x-ray.  Patient has lost weight since last office visit

## 2023-03-05 NOTE — Assessment & Plan Note (Signed)
Patient is A1c trended down.  Patient currently maintained on Mounjaro, Levemir, sliding scale insulin.  Will titrate patient's Mounjaro from 5 mg to 7.5 mg weekly.  Tolerating it well at this juncture patient does have a CGM to monitor her glucose

## 2023-03-06 ENCOUNTER — Ambulatory Visit: Payer: BC Managed Care – PPO | Admitting: Neurology

## 2023-03-06 ENCOUNTER — Other Ambulatory Visit: Payer: Self-pay | Admitting: Nurse Practitioner

## 2023-03-06 ENCOUNTER — Encounter: Payer: Self-pay | Admitting: Neurology

## 2023-03-06 VITALS — BP 122/77 | HR 120 | Ht 65.0 in | Wt 212.5 lb

## 2023-03-06 DIAGNOSIS — I6350 Cerebral infarction due to unspecified occlusion or stenosis of unspecified cerebral artery: Secondary | ICD-10-CM

## 2023-03-06 DIAGNOSIS — I69354 Hemiplegia and hemiparesis following cerebral infarction affecting left non-dominant side: Secondary | ICD-10-CM | POA: Diagnosis not present

## 2023-03-06 DIAGNOSIS — J22 Unspecified acute lower respiratory infection: Secondary | ICD-10-CM

## 2023-03-06 DIAGNOSIS — I2510 Atherosclerotic heart disease of native coronary artery without angina pectoris: Secondary | ICD-10-CM | POA: Diagnosis not present

## 2023-03-06 DIAGNOSIS — R4189 Other symptoms and signs involving cognitive functions and awareness: Secondary | ICD-10-CM

## 2023-03-06 MED ORDER — CLOPIDOGREL BISULFATE 75 MG PO TABS: 75.0000 mg | ORAL_TABLET | Freq: Every day | ORAL | 3 refills | Status: DC

## 2023-03-06 MED ORDER — AMOXICILLIN-POT CLAVULANATE 875-125 MG PO TABS: 1.0000 | ORAL_TABLET | Freq: Two times a day (BID) | ORAL | 0 refills | Status: AC

## 2023-03-06 NOTE — Progress Notes (Signed)
[facility]  PATIENT: Erika [name] DOB: 1971/05/20  REFERRING DOCTOR OR PCP: Reesa Chew, PA-C SOURCE: Patient, notes from hospital, imaging and laboratory reports, MRI images personally reviewed  _________________________________   HISTORICAL  CHIEF COMPLAINT:  Chief Complaint  Patient presents with   Room 11    Pt is here with her Daughter and Mother-in Law. Pt states that things have been much better since last appointment. Pt states that she is walking better with her walker and is able to walk 20 feet. Pt states that she has some muscle weakness in her her legs and she doesn't have full function in her left leg. Pt states that she I still unable to use her left hand.     HISTORY OF PRESENT ILLNESS:  Erika, [name] a 52 yo woman with h/o strokes.    Update 03/06/2023: She notes that she is doing about the same physically and cognitively.   The left leg is weak and arm is very weak  She reports cognitive issues worsened after her stroke.  STM is still poor but LTM is fine.  Pseudobulbar symptoms are better.   Depressiona abd anxiety are better on medications.     She uses a hemi-walker and can go about 20-50 feet.   She has one fall since last visit without injury.  She has a shower seat and has someone in the bathroom with her.    The left leg buckles, despite an orthotic  She is getting a new custom brace later this week.     She reports insomnia.   She uses oxygen at night due to desat's.    She takes medications to help the insomnia.  History of stroke: She had a right pontine stroke 06/24/2021.   At the time the whole left side was paralyzed.   Speech was slurred.   911 was called and she was taken to the [facility] ED.    She spent 3 days there in the ED due to no bed and then was taken to the [facility]. Imaging studies 06/2021 also showed two strokes incolving gray and white matter in the right parieto-occiptal lobe and the right  parietal lobe.     She has pain in the right leg due to PAD (stents have re-occluded. .   Also has pain in the joints.  She sees Pain Management and is on oxycodone.  Carotid endarterectomies 05/2020 and 06/2016 were done after carotid stenosis noted screenng after a 2017 MI (has had 2).   She had CABG in 2017.   The 2021 CEA done after repeat neck CTA showed critical stenosis distal to initial CEA.     Current LVEF% (July 2022) is 60-65%.      She is currently on Plavix.  She is on Repatha for elevated cholesterol.    She has Type 2 IDDM as well.    She has depression and is on fluoxetine.   She takes clonazepam 025 mg po bid for anxiety with benefit.    Thorazine helps her insomnia.         Imaging: I reviewed the MRI of the brain 06/24/2021 and 06/27/2021.  They show an acute infarction on the right side of the pons.  Additionally, there is encephalomalacia/gliosis in the right parieto-occipital region and right parietal lobe consistent with remote strokes.  There is mild chronic microvascular ischemic change.  CT angiogram 06/24/2021 showed stenosis in the distal right common carotid and proximal right internal carotid  artery there is high-grade stenosis of the left vertebral origin with reconstitution distally in the neck atherosclerosis is noted in the proximal left intracranial vertebral artery.  CT scan 09/08/2021 showed T2 remote strokes in the right parietal lobe that have been seen previously as well as the stroke in the pons with expected evolution  CTscan 02/25/2019 of the brain did not show any strokes.       REVIEW OF SYSTEMS: Constitutional: No fevers, chills, sweats, or change in appetite Eyes: No visual changes, double vision, eye pain Ear, nose and throat: No hearing loss, ear pain, nasal congestion, sore throat Cardiovascular: No chest pain, palpitations.  She has peripheral vascular disease and carotid stenosis Respiratory:  No shortness of breath at rest or with exertion.   No  wheezes GastrointestinaI: No nausea, vomiting, diarrhea, abdominal pain, fecal incontinence Genitourinary:  No dysuria, urinary retention or frequency.  No nocturia. Musculoskeletal:  No neck pain, back pain Integumentary: No rash, pruritus, skin lesions Neurological: as above Psychiatric: Depression/anxiety as above Endocrine: She has insulin-dependent diabetes mellitus  hematologic/Lymphatic:  No anemia, purpura, petechiae. Allergic/Immunologic: No itchy/runny eyes, nasal congestion, recent allergic reactions, rashes  ALLERGIES: Allergies  Allergen Reactions   Chantix [Varenicline Tartrate] Other (See Comments)    Feels "crazy" and angry     HOME MEDICATIONS:  Current Outpatient Medications:    acetaminophen (TYLENOL) 325 MG tablet, Take 1-2 tablets (325-650 mg total) by mouth every 4 (four) hours as needed for mild pain., Disp: , Rfl:    amoxicillin-clavulanate (AUGMENTIN) 875-125 MG tablet, Take 1 tablet by mouth 2 (two) times daily for 7 days., Disp: 14 tablet, Rfl: 0   aspirin 81 MG chewable tablet, Chew 1 tablet (81 mg total) by mouth daily with breakfast., Disp: 120 tablet, Rfl: 1   chlorproMAZINE (THORAZINE) 50 MG tablet, Take 1 tablet (50 mg total) by mouth at bedtime., Disp: 30 tablet, Rfl: 2   clonazePAM (KLONOPIN) 0.25 MG disintegrating tablet, Take 1 tablet (0.25 mg total) by mouth 2 (two) times daily., Disp: 60 tablet, Rfl: 2   Continuous Blood Gluc Sensor (DEXCOM G7 SENSOR) MISC, APPLY ONE SENSOR TO THE BACK OF YOUR UPPER ARM. REPLACE EVERY 10 DAYS., Disp: 1 each, Rfl: 2   cyclobenzaprine (FLEXERIL) 10 MG tablet, TAKE ONE TABLET BY MOUTH AT BEDTIME, Disp: 30 tablet, Rfl: 3   escitalopram (LEXAPRO) 20 MG tablet, Take 1 tablet (20 mg total) by mouth daily., Disp: 30 tablet, Rfl: 2   Evolocumab (REPATHA SURECLICK) [id number] MG/ML SOAJ, Inject 140 mg into the skin as directed. Inject 1 Dose into the skin every 14 (fourteen) days., Disp: 6 mL, Rfl: 3   ezetimibe (ZETIA) 10 MG  tablet, TAKE ONE TABLET BY MOUTH ONE TIME DAILY, Disp: 30 tablet, Rfl: 4   icosapent Ethyl (VASCEPA) 1 g capsule, Take 2 g by mouth 2 (two) times daily., Disp: , Rfl:    insulin detemir (LEVEMIR) 100 UNIT/ML injection, Inject 27 Units into the skin daily., Disp: , Rfl:    isosorbide mononitrate (IMDUR) 30 MG 24 hr tablet, TAKE ONE TABLET BY MOUTH ONE TIME DAILY, Disp: 90 tablet, Rfl: 1   lamoTRIgine (LAMICTAL) 200 MG tablet, Take 1 tablet (200 mg total) by mouth at bedtime., Disp: 30 tablet, Rfl: 2   metoprolol succinate (TOPROL-XL) 50 MG 24 hr tablet, Take 1 tablet (50 mg total) by mouth daily. Take with or immediately following a meal., Disp: 90 tablet, Rfl: 3   nitroGLYCERIN (NITROSTAT) 0.4 MG SL tablet, Place 1 tablet (  0.4 mg total) under the tongue every 5 (five) minutes as needed for chest pain., Disp: 25 tablet, Rfl: 1   Oxycodone HCl 10 MG TABS, Take 1 tablet (10 mg total) by mouth 2 (two) times daily as needed., Disp: 60 tablet, Rfl: 0   pantoprazole (PROTONIX) 40 MG tablet, TAKE ONE TABLET BY MOUTH ONE TIME DAILY, Disp: 30 tablet, Rfl: 0   pregabalin (LYRICA) 225 MG capsule, Take 1 capsule (225 mg total) by mouth 2 (two) times daily., Disp: 60 capsule, Rfl: 2   rosuvastatin (CRESTOR) 20 MG tablet, Take 1 tablet (20 mg total) by mouth daily., Disp: 90 tablet, Rfl: 1   tirzepatide (MOUNJARO) 7.5 MG/0.5ML Pen, Inject 7.5 mg into the skin once a week., Disp: 6 mL, Rfl: 0   ULTICARE MINI PEN NEEDLES 31G X 6 MM MISC, USE PEN NEEDLES TWO TIMES DAILY, Disp: 100 each, Rfl: 3   albuterol (VENTOLIN HFA) 108 (90 Base) MCG/ACT inhaler, Inhale 2 puffs into the lungs every 6 (six) hours as needed for wheezing., Disp: 18 g, Rfl: 0   clopidogrel (PLAVIX) 75 MG tablet, Take 1 tablet (75 mg total) by mouth daily., Disp: 90 tablet, Rfl: 3   Insulin lispro (HUMALOG JUNIOR KWIKPEN) 100 UNIT/ML, Inject 0-10 Units into the skin 3 (three) times daily. insulin Lispro (Humalog) injection 0-10 Units 0-10 Units  Subcutaneous, 3 times daily with meals CBG < 70: Implement Hypoglycemia Standing Orders and refer to Hypoglycemia Standing Orders sidebar report  CBG 70 - 120: 0 unit CBG 121 - 150: 0 unit  CBG 151 - 200: 1 unit CBG 201 - 250: 2 units CBG 251 - 300: 4 units CBG 301 - 350: 6 units  CBG 351 - 400: 8 units  CBG > 400: 10 units, Disp: 3 mL, Rfl: 3   senna-docusate (SENOKOT-S) 8.6-50 MG tablet, Take 2 tablets by mouth at bedtime., Disp: 120 tablet, Rfl: 0   triamcinolone cream (KENALOG) 0.1 %, Apply 1 Application topically 2 (two) times daily., Disp: 30 g, Rfl: 0   vitamin B-12 1000 MCG tablet, Take 1 tablet (1,000 mcg total) by mouth daily., Disp: , Rfl:    Vitamin D, Ergocalciferol, (DRISDOL) 1.25 MG (50000 UNIT) CAPS capsule, TAKE ONE CAPSULE BY MOUTH EVERY WEEK, Disp: 5 capsule, Rfl: 0  PAST MEDICAL HISTORY: Past Medical History:  Diagnosis Date   Acute respiratory failure with hypoxia (HCC) 06/24/2021   Arthralgia of temporomandibular joint    CAD, multiple vessel    a. 06/2016 Cath: ostLM 40%, ostLAD 40%, pLAD 95%, ost-pLCx 60%, pLCx 95%, mLCx 60%, mRCA 95%, D2 50%, LVSF nl;  b. 07/2016 CABG x 4 (LIMA->LAD, VG->Diag, VG->OM, VG->RCA); c. 08/2016 Cath: 3VD w/ 4/4 patent grafts. LAD distal to LIMA has diff dzs->Med rx; d. 08/2020 Cath: 4/4 patent grafts, native 3VD. EF 55-65%-->Med Rx.   Carotid arterial disease ([place])    a. 07/2016 s/p R CEA; b. 02/2021 U/S: RICA 123456, LICA 123456.   Cerebrovascular disease    Clotting disorder ([place])    Complex sleep apnea syndrome 10/03/2022   Depression    Diastolic dysfunction    a. 06/2016 Echo: EF 50-55%, mild inf wall HK, GR1DD, mild MR, RV sys fxn nl, mildly dilated LA, PASP nl; b. 06/2021 Echo: EF 60-65%, no rwma. Nl RV fxn.   Fatty liver disease, nonalcoholic [id number]   History of blood transfusion    with heart surgery   HLD (hyperlipidemia)    Labile hypertension    a. prior renal ngiogram  negative for RAS in 03/2016; b. catecholamines and metanephrines  normal, mildly elevated renin with normal aldosterone and normal ratio in 02/2016   Myocardial infarction [facility]) 2017   Obesity    PAD (peripheral artery disease) ([place])    a. 09/2018 s/p L SFA stenting; b. 07/2019 Periph Angio: Patent m/d L SFA stent w/ 100% L SFA distal to stent. L AT 100d, L Peroneal diff dzs-->Med Rx; c. 02/2021 ABIs: stable @ 0.61 on R and 0.46 on L.   PTSD (post-traumatic stress disorder)    Stroke ([place])    Tobacco abuse    Type 2 diabetes mellitus ([place]) 12/2015    PAST SURGICAL HISTORY: Past Surgical History:  Procedure Laterality Date   ABDOMINAL AORTOGRAM W/LOWER EXTREMITY [name] 10/15/2018   Procedure: ABDOMINAL AORTOGRAM W/LOWER EXTREMITY;  Surgeon: Wellington Hampshire, MD;  Location: [place] [facility];  Service: Cardiovascular;  Laterality: [name];   ABDOMINAL AORTOGRAM W/LOWER EXTREMITY Bilateral 08/19/2019   Procedure: ABDOMINAL AORTOGRAM W/LOWER EXTREMITY;  Surgeon: Wellington Hampshire, MD;  Location: [place] [facility];  Service: Cardiovascular;  Laterality: Bilateral;   CARDIAC CATHETERIZATION [name] 06/29/2016   Procedure: Left Heart Cath and Coronary Angiography;  Surgeon: Minna Merritts, MD;  Location: [place] [facility];  Service: Cardiovascular;  Laterality: [name];   CARDIAC CATHETERIZATION [name] 08/29/2016   Procedure: Left Heart Cath and Cors/Grafts Angiography;  Surgeon: Wellington Hampshire, MD;  Location: [place] [facility];  Service: Cardiovascular;  Laterality: [name];   CESAREAN SECTION     CHOLECYSTECTOMY     CORONARY ARTERY BYPASS GRAFT [name] 07/06/2016   Procedure: CORONARY ARTERY BYPASS GRAFTING (CABG) x four, using left internal mammary artery and right leg greater saphenous vein harvested endoscopically;  Surgeon: Ivin Poot, MD;  Location: [place];  Service: Open Heart Surgery;  Laterality: [name];   ENDARTERECTOMY Right 07/06/2016   Procedure: ENDARTERECTOMY CAROTID;  Surgeon: Rosetta Posner, MD;  Location: [facility] OR;  Service: Vascular;  Laterality: Right;    ENDARTERECTOMY Right 04/27/2020   Procedure: REDO OF RIGHT ENDARTERECTOMY CAROTID;  Surgeon: Rosetta Posner, MD;  Location: [facility];  Service: Vascular;  Laterality: Right;   LEFT HEART CATH AND CORS/GRAFTS ANGIOGRAPHY [name] 08/24/2020   Procedure: LEFT HEART CATH AND CORS/GRAFTS ANGIOGRAPHY;  Surgeon: Wellington Hampshire, MD;  Location: [place] [facility];  Service: Cardiovascular;  Laterality: [name];   LEFT HEART CATH AND CORS/GRAFTS ANGIOGRAPHY [name] 01/29/2022   Procedure: LEFT HEART CATH AND CORS/GRAFTS ANGIOGRAPHY;  Surgeon: Wellington Hampshire, MD;  Location: [place] [facility];  Service: Cardiovascular;  Laterality: [name];   PERIPHERAL VASCULAR CATHETERIZATION [name] 04/18/2016   Procedure: Renal Angiography;  Surgeon: Wellington Hampshire, MD;  Location: [place] [facility];  Service: Cardiovascular;  Laterality: [name];   PERIPHERAL VASCULAR INTERVENTION Left 10/15/2018   Procedure: PERIPHERAL VASCULAR INTERVENTION;  Surgeon: Wellington Hampshire, MD;  Location: [place] [facility];  Service: Cardiovascular;  Laterality: Left;  Left superficial femoral   TEE WITHOUT CARDIOVERSION [name] 07/06/2016   Procedure: TRANSESOPHAGEAL ECHOCARDIOGRAM (TEE);  Surgeon: Ivin Poot, MD;  Location: [place];  Service: Open Heart Surgery;  Laterality: [name];   TONSILLECTOMY      FAMILY HISTORY: Family History  Adopted: Yes  Problem Relation Age of Onset   Diabetes Mother    Diabetes Father    Alcohol abuse Father    Heart disease Father    Drug abuse Father    Stroke Sister    Anxiety disorder Sister  SOCIAL HISTORY:  Social History   Socioeconomic History   Marital status: Married    Spouse name: Not on file   Number of children: 1   Years of education: 14   Highest education level: Not on file  Occupational History   Occupation: Disability  Tobacco Use   Smoking status: Former    Packs/day: 0.50    Years: 30.00    Total pack years: 15.00    Types: Cigarettes    Quit date: 2022    Years since quitting: 2.1     Passive exposure: Never   Smokeless tobacco: Never  Vaping Use   Vaping Use: Every day   Substances: Flavoring  Substance and Sexual Activity   Alcohol use: Not Currently    Alcohol/week: 0.0 standard drinks of alcohol    Comment: socially   Drug use: No   Sexual activity: Yes    Partners: Male    Birth control/protection: None  Other Topics Concern   Not on file  Social History Narrative   11/21/20   From: [name]   Living: with husband, [name] (2009) and special needs daughter    Work: disability       Family: daughter - [name] (special needs, lives with her) and 2 grown step children - [name] and [name]       Enjoys: play with dogs - 2 german Shepard, mut, and blue tick walker mix      Exercise: PAD limits exercise   Diet: diabetic diet and low potassium due to daughter      Safety   Seat belts: Yes    Guns: Yes  and secure   Safe in relationships: Yes    Social Determinants of Radio broadcast assistant Strain: Not on file  Food Insecurity: Not on file  Transportation Needs: Not on file  Physical Activity: Not on file  Stress: Not on file  Social Connections: Not on file  Intimate Partner Violence: Not on file     PHYSICAL EXAM  Vitals:   03/06/23 1126  BP: 122/77  Pulse: (!) 120  Weight: 212 lb 8 oz (96.4 kg)  Height: '5\' 5"'$  (1.651 m)    Body mass index is 35.36 kg/m.   General: The patient is well-developed and well-nourished and in no acute distress  HEENT:  Head is Colon/AT.  Sclera are anicteric.   Skin: Extremities are without rash or edema.  Musculoskeletal:  Back is nontender  Neurologic Exam  Mental status: The patient is alert and oriented x  3 (better) at the time of the examination.  Short-term memory was 3/3  Speech is normal.  No neglect (touch or visual)  Cranial nerves:  Visual fields seemed better.  Extraocular movements are full.  Facial strength and sensation was normal.  No obvious hearing deficits are  noted.  Motor:  Muscle bulk is normal.   Tone is reduced in the left arm. Strength is  5 / 5 on the right but 0-1/5 in the distal left arm, 2 -/5 in the proximal left arm and 2/5 in the left leg except 3/5 quads..   Sensory: Sensory testing is intact to pinprick, soft touch and vibration sensation in all 4 extremities.  Coordination: Cerebellar testing reveals good finger-nose-finger and heel-to-shin bilaterally.  Gait and station: She needs support to stand up from the chair but is able to bear her weight once up.  With support she can take some steps.  She has left foot drop.Marland Kitchen  Reflexes: Deep tendon reflexes are symmetric and normal bilaterally.   r.    DIAGNOSTIC DATA (LABS, IMAGING, TESTING) - I reviewed patient records, labs, notes, testing and imaging myself where available.  Lab Results  Component Value Date   WBC 6.4 11/30/2022   HGB 14.0 11/30/2022   HCT 43.3 11/30/2022   MCV 87.8 11/30/2022   PLT 280 11/30/2022      Component Value Date/Time   NA 134 (L) 11/30/2022 1452   NA 138 09/11/2021 1413   NA 135 (L) 01/11/2015 1022   K 4.5 11/30/2022 1452   K 3.9 01/11/2015 1022   CL 97 (L) 11/30/2022 1452   CL 100 01/11/2015 1022   CO2 28 11/30/2022 1452   CO2 27 01/11/2015 1022   GLUCOSE 503 (HH) 11/30/2022 1452   GLUCOSE 183 (H) 01/11/2015 1022   BUN 11 11/30/2022 1452   BUN 10 09/11/2021 1413   BUN 6 (L) 01/11/2015 1022   CREATININE 0.81 11/30/2022 1452   CALCIUM 9.8 11/30/2022 1452   CALCIUM 10.0 01/11/2015 1022   PROT 6.4 11/30/2022 1452   PROT 7.4 09/11/2021 1413   PROT 7.8 01/11/2015 1022   ALBUMIN 4.3 05/25/2022 1032   ALBUMIN 4.5 09/11/2021 1413   ALBUMIN 3.4 01/11/2015 1022   AST 24 11/30/2022 1452   AST 70 (H) 01/11/2015 1022   ALT 35 (H) 11/30/2022 1452   ALT 85 (H) 01/11/2015 1022   ALKPHOS 71 05/25/2022 1032   ALKPHOS 91 01/11/2015 1022   BILITOT 0.2 11/30/2022 1452   BILITOT 0.2 09/11/2021 1413   BILITOT 0.4 01/11/2015 1022   GFRNONAA >60  01/29/2022 0436   GFRNONAA >60 01/11/2015 1022   GFRNONAA 41 (L) 08/10/2014 1459   GFRAA 106 01/12/2021 0934   GFRAA >60 01/11/2015 1022   GFRAA 48 (L) 08/10/2014 1459   Lab Results  Component Value Date   CHOL 93 05/25/2022   HDL 41.80 05/25/2022   LDLCALC 15 05/25/2022   LDLDIRECT 99.6 (H) 06/25/2021   TRIG 182.0 (H) 05/25/2022   CHOLHDL 2 05/25/2022   Lab Results  Component Value Date   HGBA1C 8.2 (A) 03/05/2023   Lab Results  Component Value Date   VITAMINB12 169 (L) 06/28/2021   Lab Results  Component Value Date   TSH 1.950 06/28/2021       ASSESSMENT AND PLAN  Right pontine stroke (HCC)  Hemiplegia and hemiparesis following cerebral infarction affecting left non-dominant side (HCC)  Cognitive change  Coronary artery disease involving native coronary artery without angina pectoris, unspecified whether native or transplanted heart - Plan: clopidogrel (PLAVIX) 75 MG tablet   Continue Plavix and aspirin.  The Plavix was renewed for a year. Cognitive issues are likely a combination of the parietal stroke and depression/reduced focus.  She did better today than she did last year.  Continue therapy as indicated  She asked about driving.  She generally needs somebody to help her get in and out of a car.  Additionally there are mild cognitive issues.  Therefore, I recommend that she continue not to drive.  However, if she absolutely wants to drive we could have her do an occupational therapy evaluation to see if she can do so safely..   Return in 12 months or sooner if there are new or worsening neurologic symptoms.  Jonah Gingras A. Felecia Shelling, MD, [facility] [id number], [id number] PM Certified in Neurology, Clinical Neurophysiology, Sleep Medicine and Neuroimaging  [facility] [address], [place], [place] [address] [phone]

## 2023-03-08 ENCOUNTER — Ambulatory Visit: Payer: BC Managed Care – PPO | Admitting: Registered Nurse

## 2023-03-08 DIAGNOSIS — G4733 Obstructive sleep apnea (adult) (pediatric): Secondary | ICD-10-CM | POA: Diagnosis not present

## 2023-03-08 LAB — DRUG TOX MONITOR 1 W/CONF, ORAL FLD
Amphetamines: NEGATIVE ng/mL (ref <10)
Barbiturates: NEGATIVE ng/mL (ref <10)
Benzodiazepines: NEGATIVE ng/mL (ref <0.50)
Buprenorphine: NEGATIVE ng/mL (ref <0.10)
Cocaine: NEGATIVE ng/mL (ref <5.0)
Cotinine: 8.7 ng/mL — ABNORMAL HIGH (ref <5.0)
Fentanyl: NEGATIVE ng/mL (ref <0.10)
Heroin Metabolite: NEGATIVE ng/mL (ref <1.0)
MARIJUANA: NEGATIVE ng/mL (ref <2.5)
MDMA: NEGATIVE ng/mL (ref <10)
Meprobamate: NEGATIVE ng/mL (ref <2.5)
Methadone: NEGATIVE ng/mL (ref <5.0)
Nicotine Metabolite: POSITIVE ng/mL — AB (ref <5.0)
Opiates: NEGATIVE ng/mL (ref <2.5)
Phencyclidine: NEGATIVE ng/mL (ref <10)
Tapentadol: NEGATIVE ng/mL (ref <5.0)
Tramadol: NEGATIVE ng/mL (ref <5.0)
Zolpidem: NEGATIVE ng/mL (ref <5.0)

## 2023-03-08 LAB — DRUG TOX ALC METAB W/CON, ORAL FLD: Alcohol Metabolite: NEGATIVE ng/mL (ref <25)

## 2023-03-12 DIAGNOSIS — S8252XD Displaced fracture of medial malleolus of left tibia, subsequent encounter for closed fracture with routine healing: Secondary | ICD-10-CM | POA: Diagnosis not present

## 2023-03-21 ENCOUNTER — Other Ambulatory Visit (HOSPITAL_COMMUNITY): Payer: Self-pay | Admitting: Psychiatry

## 2023-03-21 ENCOUNTER — Other Ambulatory Visit: Payer: Self-pay | Admitting: Nurse Practitioner

## 2023-03-21 DIAGNOSIS — F331 Major depressive disorder, recurrent, moderate: Secondary | ICD-10-CM

## 2023-03-21 DIAGNOSIS — Z794 Long term (current) use of insulin: Secondary | ICD-10-CM

## 2023-03-21 DIAGNOSIS — F4310 Post-traumatic stress disorder, unspecified: Secondary | ICD-10-CM

## 2023-04-01 ENCOUNTER — Other Ambulatory Visit (HOSPITAL_COMMUNITY): Payer: Self-pay | Admitting: Psychiatry

## 2023-04-01 DIAGNOSIS — F331 Major depressive disorder, recurrent, moderate: Secondary | ICD-10-CM

## 2023-04-01 DIAGNOSIS — F4310 Post-traumatic stress disorder, unspecified: Secondary | ICD-10-CM

## 2023-04-04 ENCOUNTER — Other Ambulatory Visit: Payer: Self-pay | Admitting: Neurology

## 2023-04-04 DIAGNOSIS — I2510 Atherosclerotic heart disease of native coronary artery without angina pectoris: Secondary | ICD-10-CM

## 2023-04-05 DIAGNOSIS — G4733 Obstructive sleep apnea (adult) (pediatric): Secondary | ICD-10-CM | POA: Diagnosis not present

## 2023-04-08 ENCOUNTER — Other Ambulatory Visit: Payer: Self-pay | Admitting: Nurse Practitioner

## 2023-04-08 ENCOUNTER — Other Ambulatory Visit: Payer: Self-pay | Admitting: Physical Medicine and Rehabilitation

## 2023-04-08 DIAGNOSIS — G4733 Obstructive sleep apnea (adult) (pediatric): Secondary | ICD-10-CM | POA: Diagnosis not present

## 2023-04-08 MED ORDER — PANTOPRAZOLE SODIUM 40 MG PO TBEC: 40.0000 mg | DELAYED_RELEASE_TABLET | Freq: Every day | ORAL | 0 refills | Status: DC

## 2023-04-08 MED ORDER — ALBUTEROL SULFATE HFA 108 (90 BASE) MCG/ACT IN AERS: 2.0000 | INHALATION_SPRAY | Freq: Four times a day (QID) | RESPIRATORY_TRACT | 0 refills | Status: DC | PRN

## 2023-04-08 MED ORDER — INSULIN DETEMIR 100 UNIT/ML ~~LOC~~ SOLN: 27.0000 [IU] | Freq: Every day | SUBCUTANEOUS | 1 refills | Status: DC

## 2023-04-08 NOTE — Telephone Encounter (Signed)
Last seen on 03/06/23 Follow up 03/05/24 Last filled on 03/11/23 # 90 tablets ( 90 day supply) Rx denied, requesting refill too soon.

## 2023-04-08 NOTE — Telephone Encounter (Signed)
Is this a medication your are refilling continuously?

## 2023-04-11 ENCOUNTER — Encounter: Payer: Self-pay | Admitting: Registered Nurse

## 2023-04-11 ENCOUNTER — Encounter: Payer: BC Managed Care – PPO | Attending: Physical Medicine and Rehabilitation | Admitting: Registered Nurse

## 2023-04-11 VITALS — BP 130/76 | HR 100 | Ht 65.0 in | Wt 206.0 lb

## 2023-04-11 DIAGNOSIS — Z760 Encounter for issue of repeat prescription: Secondary | ICD-10-CM | POA: Diagnosis not present

## 2023-04-11 DIAGNOSIS — I63411 Cerebral infarction due to embolism of right middle cerebral artery: Secondary | ICD-10-CM | POA: Insufficient documentation

## 2023-04-11 DIAGNOSIS — G894 Chronic pain syndrome: Secondary | ICD-10-CM | POA: Insufficient documentation

## 2023-04-11 DIAGNOSIS — G44029 Chronic cluster headache, not intractable: Secondary | ICD-10-CM | POA: Insufficient documentation

## 2023-04-11 DIAGNOSIS — R238 Other skin changes: Secondary | ICD-10-CM | POA: Insufficient documentation

## 2023-04-11 DIAGNOSIS — G629 Polyneuropathy, unspecified: Secondary | ICD-10-CM | POA: Insufficient documentation

## 2023-04-11 DIAGNOSIS — Z5181 Encounter for therapeutic drug level monitoring: Secondary | ICD-10-CM | POA: Insufficient documentation

## 2023-04-11 DIAGNOSIS — Z79899 Other long term (current) drug therapy: Secondary | ICD-10-CM | POA: Diagnosis not present

## 2023-04-11 MED ORDER — PREGABALIN 225 MG PO CAPS: 225.0000 mg | ORAL_CAPSULE | Freq: Two times a day (BID) | ORAL | 3 refills | Status: DC

## 2023-04-11 MED ORDER — OXYCODONE HCL 10 MG PO TABS
10.0000 mg | ORAL_TABLET | Freq: Two times a day (BID) | ORAL | 0 refills | Status: DC | PRN
Start: 2023-04-11 — End: 2023-05-07

## 2023-04-11 NOTE — Progress Notes (Signed)
Subjective:    Patient ID: Erika [name], female    DOB: Feb 24, 1971, 52 y.o.   MRN: [id number]  HPI: Erika [name] is a 52 y.o. female who returns for follow up appointment for chronic pain and medication refill. She states she has chronic headache  pain. She rates her pain 7. Her current exercise regime is walking in her home  and performing stretching exercises.  Ms. Mcintire Morphine equivalent is 27.00 MME.  She  is also prescribed Clonazepam  by Dr. Lolly Mustache .We have discussed the black box warning of using opioids and benzodiazepines. I highlighted the dangers of using these drugs together and discussed the adverse events including respiratory suppression, overdose, cognitive impairment and importance of compliance with current regimen. We will continue to monitor and adjust as indicated.  she is being closely monitored and under the care of her psychiatrist.     Last Oral Swab was Performed on 03/05/2023, see note for details.     Pain Inventory Average Pain 5 Pain Right Now 7 My pain is constant and aching  In the last 24 hours, has pain interfered with the following? General activity 9 Relation with others 1 Enjoyment of life 7 What TIME of day is your pain at its worst? night Sleep (in general) Poor  Pain is worse with:  n/a Pain improves with: medication Relief from Meds: 3  Family History  Adopted: Yes  Problem Relation Age of Onset   Diabetes Mother    Diabetes Father    Alcohol abuse Father    Heart disease Father    Drug abuse Father    Stroke Sister    Anxiety disorder Sister    Social History   Socioeconomic History   Marital status: Married    Spouse name: Not on file   Number of children: 1   Years of education: 14   Highest education level: Not on file  Occupational History   Occupation: Disability  Tobacco Use   Smoking status: Former    Packs/day: 0.50    Years: 30.00    Additional pack years: 0.00    Total pack years: 15.00     Types: Cigarettes    Quit date: 2022    Years since quitting: 2.2    Passive exposure: Never   Smokeless tobacco: Never  Vaping Use   Vaping Use: Every day   Substances: Flavoring  Substance and Sexual Activity   Alcohol use: Not Currently    Alcohol/week: 0.0 standard drinks of alcohol    Comment: socially   Drug use: No   Sexual activity: Yes    Partners: Male    Birth control/protection: None  Other Topics Concern   Not on file  Social History Narrative   11/21/20   From: [name]   Living: with husband, [name] (2009) and special needs daughter    Work: disability       Family: daughter - [name] (special needs, lives with her) and 2 grown step children - [name] and [name] Client       Enjoys: play with dogs - 2 german Shepard, mut, and blue tick walker mix      Exercise: PAD limits exercise   Diet: diabetic diet and low potassium due to daughter      Safety   Seat belts: Yes    Guns: Yes  and secure   Safe in relationships: Yes    Social Determinants of Health   Financial Resource Strain: Not on file  Food Insecurity: Not on file  Transportation Needs: Not on file  Physical Activity: Not on file  Stress: Not on file  Social Connections: Not on file   Past Surgical History:  Procedure Laterality Date   ABDOMINAL AORTOGRAM W/LOWER EXTREMITY [name] 10/15/2018   Procedure: ABDOMINAL AORTOGRAM W/LOWER EXTREMITY;  Surgeon: Iran Ouch, MD;  [facility];  Service: Cardiovascular;  Laterality: [name];   ABDOMINAL AORTOGRAM W/LOWER EXTREMITY Bilateral 08/19/2019   Procedure: ABDOMINAL AORTOGRAM W/LOWER EXTREMITY;  Surgeon: Iran Ouch, MD;  [facility]: [facility];  Service: Cardiovascular;  Laterality: Bilateral;   CARDIAC CATHETERIZATION [name] 06/29/2016   Procedure: Left Heart Cath and Coronary Angiography;  Surgeon: Antonieta Iba, MD;  Location: [facility];  Service: Cardiovascular;  Laterality: [name];   CARDIAC CATHETERIZATION  [name] 08/29/2016   Procedure: Left Heart Cath and Cors/Grafts Angiography;  Surgeon: Iran Ouch, MD;  [facility];  Service: Cardiovascular;  Laterality: [name];   CESAREAN SECTION     CHOLECYSTECTOMY     CORONARY ARTERY BYPASS GRAFT [name] 07/06/2016   Procedure: CORONARY ARTERY BYPASS GRAFTING (CABG) x four, using left internal mammary artery and right leg greater saphenous vein harvested endoscopically;  Surgeon: Kerin Perna, MD;  Location: [facility] OR;  Service: Open Heart Surgery;  Laterality: [name];   ENDARTERECTOMY Right 07/06/2016   Procedure: ENDARTERECTOMY CAROTID;  Surgeon: Larina Earthly, MD;  Location: [facility] OR;  Service: Vascular;  Laterality: Right;   ENDARTERECTOMY Right 04/27/2020   Procedure: REDO OF RIGHT ENDARTERECTOMY CAROTID;  Surgeon: Larina Earthly, MD;  Location: [facility] OR;  Service: Vascular;  Laterality: Right;   LEFT HEART CATH AND CORS/GRAFTS ANGIOGRAPHY [name] 08/24/2020   Procedure: LEFT HEART CATH AND CORS/GRAFTS ANGIOGRAPHY;  Surgeon: Iran Ouch, MD;  [facility];  Service: Cardiovascular;  Laterality: [name];   LEFT HEART CATH AND CORS/GRAFTS ANGIOGRAPHY [name] 01/29/2022   Procedure: LEFT HEART CATH AND CORS/GRAFTS ANGIOGRAPHY;  Surgeon: Iran Ouch, MD;  Location: [facility];  Service: Cardiovascular;  Laterality: [name];   PERIPHERAL VASCULAR CATHETERIZATION [name] 04/18/2016   Procedure: Renal Angiography;  Surgeon: Iran Ouch, MD;  [facility];  Service: Cardiovascular;  Laterality: [name];   PERIPHERAL VASCULAR INTERVENTION Left 10/15/2018   Procedure: PERIPHERAL VASCULAR INTERVENTION;  Surgeon: Iran Ouch, MD;  [facility];  Service: Cardiovascular;  Laterality: Left;  Left superficial femoral   TEE WITHOUT CARDIOVERSION [name] 07/06/2016   Procedure: TRANSESOPHAGEAL ECHOCARDIOGRAM (TEE);  Surgeon: Kerin Perna, MD;  Location: [facility] OR;  Service: Open Heart Surgery;  Laterality: [name];   TONSILLECTOMY      Past Surgical History:  Procedure Laterality Date   ABDOMINAL AORTOGRAM W/LOWER EXTREMITY [name] 10/15/2018   Procedure: ABDOMINAL AORTOGRAM W/LOWER EXTREMITY;  Surgeon: Iran Ouch, MD;  [facility];  Service: Cardiovascular;  Laterality: [name];   ABDOMINAL AORTOGRAM W/LOWER EXTREMITY Bilateral 08/19/2019   Procedure: ABDOMINAL AORTOGRAM W/LOWER EXTREMITY;  Surgeon: Iran Ouch, MD;  [facility];  Service: Cardiovascular;  Laterality: Bilateral;   CARDIAC CATHETERIZATION [name] 06/29/2016   Procedure: Left Heart Cath and Coronary Angiography;  Surgeon: Antonieta Iba, MD;  Location: [facility];  Service: Cardiovascular;  Laterality: [name];   CARDIAC CATHETERIZATION [name] 08/29/2016   Procedure: Left Heart Cath and Cors/Grafts Angiography;  Surgeon: Iran Ouch, MD;  [facility];  Service: Cardiovascular;  Laterality: [name];   CESAREAN SECTION     CHOLECYSTECTOMY     CORONARY ARTERY BYPASS GRAFT [name] 07/06/2016   Procedure: CORONARY ARTERY BYPASS GRAFTING (CABG) x four, using left internal mammary artery and right leg greater saphenous vein harvested endoscopically;  Surgeon: Kerin Perna, MD;  Location: [facility] OR;  Service: Open Heart Surgery;  Laterality: [name];   ENDARTERECTOMY Right 07/06/2016   Procedure: ENDARTERECTOMY CAROTID;  Surgeon: Larina Earthly, MD;  Location: [facility] OR;  Service: Vascular;  Laterality: Right;   ENDARTERECTOMY Right 04/27/2020   Procedure: REDO OF RIGHT ENDARTERECTOMY CAROTID;  Surgeon: Larina Earthly, MD;  Location: [facility] OR;  Service: Vascular;  Laterality: Right;   LEFT HEART CATH AND CORS/GRAFTS ANGIOGRAPHY [name] 08/24/2020   Procedure: LEFT HEART CATH AND CORS/GRAFTS ANGIOGRAPHY;  Surgeon: Iran Ouch, MD;  [facility];  Service: Cardiovascular;  Laterality: [name];   LEFT HEART CATH AND CORS/GRAFTS ANGIOGRAPHY [name] 01/29/2022   Procedure: LEFT HEART CATH AND CORS/GRAFTS ANGIOGRAPHY;  Surgeon: Iran Ouch, MD;  Location: [facility];  Service: Cardiovascular;  Laterality: [name];   PERIPHERAL VASCULAR CATHETERIZATION [name] 04/18/2016   Procedure: Renal Angiography;  Surgeon: Iran Ouch, MD;  [facility];  Service: Cardiovascular;  Laterality: [name];   PERIPHERAL VASCULAR INTERVENTION Left 10/15/2018   Procedure: PERIPHERAL VASCULAR INTERVENTION;  Surgeon: Iran Ouch, MD;  [facility];  Service: Cardiovascular;  Laterality: Left;  Left superficial femoral   TEE WITHOUT CARDIOVERSION [name] 07/06/2016   Procedure: TRANSESOPHAGEAL ECHOCARDIOGRAM (TEE);  Surgeon: Kerin Perna, MD;  Location: [facility] OR;  Service: Open Heart Surgery;  Laterality: [name];   TONSILLECTOMY     Past Medical History:  Diagnosis Date   Acute respiratory failure with hypoxia (HCC) 06/24/2021   Arthralgia of temporomandibular joint    CAD, multiple vessel    a. 06/2016 Cath: ostLM 40%, ostLAD 40%, pLAD 95%, ost-pLCx 60%, pLCx 95%, mLCx 60%, mRCA 95%, D2 50%, LVSF nl;  b. 07/2016 CABG x 4 (LIMA->LAD, VG->Diag, VG->OM, VG->RCA); c. 08/2016 Cath: 3VD w/ 4/4 patent grafts. LAD distal to LIMA has diff dzs->Med rx; d. 08/2020 Cath: 4/4 patent grafts, native 3VD. EF 55-65%-->Med Rx.   Carotid arterial disease (HCC)    a. 07/2016 s/p R CEA; b. 02/2021 U/S: RICA 40-59%, LICA 1-39%.   Cerebrovascular disease    Clotting disorder (HCC)    Complex sleep apnea syndrome 10/03/2022   Depression    Diastolic dysfunction    a. 06/2016 Echo: EF 50-55%, mild inf wall HK, GR1DD, mild MR, RV sys fxn nl, mildly dilated LA, PASP nl; b. 06/2021 Echo: EF 60-65%, no rwma. Nl RV fxn.   Fatty liver disease, nonalcoholic 2016   History of blood transfusion    with heart surgery   HLD (hyperlipidemia)    Labile hypertension    a. prior renal ngiogram negative for RAS in 03/2016; b. catecholamines and metanephrines normal, mildly elevated renin with normal aldosterone and normal ratio in 02/2016   Myocardial  infarction [facility]) 2017   Obesity    PAD (peripheral artery disease) (HCC)    a. 09/2018 s/p L SFA stenting; b. 07/2019 Periph Angio: Patent m/d L SFA stent w/ 100% L SFA distal to stent. L AT 100d, L Peroneal diff dzs-->Med Rx; c. 02/2021 ABIs: stable @ 0.61 on R and 0.46 on L.   PTSD (post-traumatic stress disorder)    Stroke [facility])    Tobacco abuse  Type 2 diabetes mellitus (HCC) 12/2015   BP 130/76   Pulse 100   Ht  (1.651 m)   Wt 206 lb (93.4 kg)   SpO2 93%   BMI 34.28 kg/m   Opioid Risk Score:   Fall Risk Score:  `1  Depression screen PHQ 2/9     03/05/2023    1:21 PM 03/05/2023   10:13 AM 12/10/2022    8:51 AM 11/30/2022    2:13 PM 10/09/2022    2:17 PM 08/09/2022   10:49 AM 11/10/2021    1:25 PM  Depression screen PHQ 2/9  Decreased Interest 3 0 0 0 0 1 1  Down, Depressed, Hopeless 3 3 0 3 0 1 1  PHQ - 2 Score 6 3 0 3 0 2 2  Altered sleeping  3  3     Tired, decreased energy  3  3     Change in appetite  0  3     Feeling bad or failure about yourself   3  3     Trouble concentrating  1  0     Moving slowly or fidgety/restless  1  0     Suicidal thoughts  0  0     PHQ-9 Score  14  15     Difficult doing work/chores  Somewhat difficult  Extremely dIfficult        Review of Systems  Musculoskeletal:        Headache  All other systems reviewed and are negative.      Objective:   Physical Exam Vitals and nursing note reviewed.  Constitutional:      Appearance: Normal appearance.  Cardiovascular:     Rate and Rhythm: Normal rate and regular rhythm.     Pulses: Normal pulses.     Heart sounds: Normal heart sounds.  Pulmonary:     Effort: Pulmonary effort is normal.     Breath sounds: Normal breath sounds.  Musculoskeletal:     Cervical back: Normal range of motion and neck supple.     Comments: Normal Muscle Bulk and Muscle Testing Reveals:  Upper Extremities: Right: Full ROM and Muscle Strength 5/5 Left Upper Extremity: Decreased ROM 20 Degrees and  Muscle Strength 2/5  Lower Extremities: Full ROM and Muscle Strength 5/5 Arrived in wheelchair    Skin:    General: Skin is warm and dry.  Neurological:     Mental Status: She is alert and oriented to person, place, and time.  Psychiatric:        Mood and Affect: Mood normal.        Behavior: Behavior normal.         Assessment & Plan:    Cerebrovascular Accident: Neurology Following. Continue current medication regimen. Continue to Monitor. 04/11/2023 Cluster Headache: She is scheduled for Botox with Dr Carlis Abbott.   Continue HEP as Tolerated. Continue to Monitor. 04/11/2023 Chronic Pain Refilled: Oxycodone 10 mg one tablet twice a day as needed for pain #60. We will continue the opioid monitoring program, this consists of regular clinic visits, examinations, urine drug screen, pill counts as well as use of [place] Controlled Substance Reporting system. A 12 month History has been reviewed on the [place] Controlled Substance Reporting System on 03/05/2023   Neuropathy: Continue Lyrica. Continue to Monitor. 04/11/2023  5. Insomnia: Continue Amitriptyline. Continue to Monitor. 04/11/2023   F/U in 1 month

## 2023-04-12 ENCOUNTER — Telehealth (HOSPITAL_BASED_OUTPATIENT_CLINIC_OR_DEPARTMENT_OTHER): Payer: BC Managed Care – PPO | Admitting: Psychiatry

## 2023-04-12 ENCOUNTER — Other Ambulatory Visit: Payer: Self-pay | Admitting: Nurse Practitioner

## 2023-04-12 ENCOUNTER — Encounter (HOSPITAL_COMMUNITY): Payer: Self-pay | Admitting: Psychiatry

## 2023-04-12 VITALS — Wt 206.0 lb

## 2023-04-12 DIAGNOSIS — F331 Major depressive disorder, recurrent, moderate: Secondary | ICD-10-CM

## 2023-04-12 DIAGNOSIS — Z794 Long term (current) use of insulin: Secondary | ICD-10-CM

## 2023-04-12 DIAGNOSIS — F4310 Post-traumatic stress disorder, unspecified: Secondary | ICD-10-CM | POA: Diagnosis not present

## 2023-04-12 DIAGNOSIS — F410 Panic disorder [episodic paroxysmal anxiety] without agoraphobia: Secondary | ICD-10-CM

## 2023-04-12 MED ORDER — LAMOTRIGINE 25 MG PO TABS
25.0000 mg | ORAL_TABLET | Freq: Every day | ORAL | 1 refills | Status: DC
Start: 2023-04-12 — End: 2023-05-13

## 2023-04-12 MED ORDER — CHLORPROMAZINE HCL 50 MG PO TABS: 50.0000 mg | ORAL_TABLET | Freq: Every day | ORAL | 1 refills | Status: DC

## 2023-04-12 MED ORDER — LAMOTRIGINE 200 MG PO TABS
200.0000 mg | ORAL_TABLET | Freq: Every day | ORAL | 1 refills | Status: DC
Start: 2023-04-12 — End: 2023-05-13

## 2023-04-12 MED ORDER — CLONAZEPAM 0.5 MG PO TABS
ORAL_TABLET | ORAL | 1 refills | Status: DC
Start: 2023-04-12 — End: 2023-05-13

## 2023-04-12 MED ORDER — ESCITALOPRAM OXALATE 20 MG PO TABS
20.0000 mg | ORAL_TABLET | Freq: Every day | ORAL | 1 refills | Status: DC
Start: 2023-04-12 — End: 2023-05-13
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
## 2023-04-15 ENCOUNTER — Ambulatory Visit: Payer: BC Managed Care – PPO | Admitting: Physical Medicine and Rehabilitation

## 2023-04-16 ENCOUNTER — Ambulatory Visit: Payer: BC Managed Care – PPO | Attending: Medical

## 2023-04-16 DIAGNOSIS — I739 Peripheral vascular disease, unspecified: Secondary | ICD-10-CM

## 2023-04-16 LAB — VAS US ABI WITH/WO TBI: Left ABI: 0.51

## 2023-04-17 LAB — VAS US ABI WITH/WO TBI: Right ABI: 0.59

## 2023-04-18 ENCOUNTER — Encounter: Payer: BC Managed Care – PPO | Admitting: Physical Medicine and Rehabilitation

## 2023-04-18 VITALS — BP 134/84 | HR 91 | Ht 65.0 in | Wt 206.0 lb

## 2023-04-18 DIAGNOSIS — G629 Polyneuropathy, unspecified: Secondary | ICD-10-CM | POA: Diagnosis not present

## 2023-04-18 DIAGNOSIS — R238 Other skin changes: Secondary | ICD-10-CM

## 2023-04-18 DIAGNOSIS — G894 Chronic pain syndrome: Secondary | ICD-10-CM

## 2023-04-18 DIAGNOSIS — Z5181 Encounter for therapeutic drug level monitoring: Secondary | ICD-10-CM | POA: Diagnosis not present

## 2023-04-18 DIAGNOSIS — I63411 Cerebral infarction due to embolism of right middle cerebral artery: Secondary | ICD-10-CM | POA: Diagnosis not present

## 2023-04-18 DIAGNOSIS — G44029 Chronic cluster headache, not intractable: Secondary | ICD-10-CM

## 2023-04-18 DIAGNOSIS — Z79899 Other long term (current) drug therapy: Secondary | ICD-10-CM | POA: Diagnosis not present

## 2023-04-18 DIAGNOSIS — Z760 Encounter for issue of repeat prescription: Secondary | ICD-10-CM | POA: Diagnosis not present

## 2023-04-18 MED ORDER — ONABOTULINUMTOXINA 100 UNITS IJ SOLR
300.0000 [IU] | Freq: Once | INTRAMUSCULAR | Status: AC
Start: 2023-04-18 — End: 2023-04-18
  Administered 2023-04-18: 300 [IU] via INTRAMUSCULAR

## 2023-04-18 MED ORDER — SODIUM CHLORIDE (PF) 0.9 % IJ SOLN
6.0000 mL | Freq: Once | INTRAMUSCULAR | Status: AC
Start: 2023-04-18 — End: 2023-04-18
  Administered 2023-04-18: 6 mL via INTRAVENOUS

## 2023-04-19 ENCOUNTER — Ambulatory Visit: Payer: BC Managed Care – PPO | Attending: Cardiovascular Disease | Admitting: Cardiovascular Disease

## 2023-04-19 ENCOUNTER — Encounter: Payer: Self-pay | Admitting: Cardiovascular Disease

## 2023-04-19 VITALS — BP 164/94 | HR 91 | Ht 66.0 in | Wt 208.2 lb

## 2023-04-19 DIAGNOSIS — I739 Peripheral vascular disease, unspecified: Secondary | ICD-10-CM

## 2023-04-19 DIAGNOSIS — I10 Essential (primary) hypertension: Secondary | ICD-10-CM | POA: Diagnosis not present

## 2023-04-19 DIAGNOSIS — I779 Disorder of arteries and arterioles, unspecified: Secondary | ICD-10-CM

## 2023-04-19 DIAGNOSIS — E785 Hyperlipidemia, unspecified: Secondary | ICD-10-CM

## 2023-04-19 DIAGNOSIS — I25118 Atherosclerotic heart disease of native coronary artery with other forms of angina pectoris: Secondary | ICD-10-CM | POA: Diagnosis not present

## 2023-04-19 NOTE — Progress Notes (Signed)
Botox Injection for chronic migraine headaches   Dilution: 100 Units/ 2ml preservative free NS x2 Indication: refractory headaches (At least 15 days per month/headache lasting greater than 4 hours per day) incompletely responsive to other more conservative measures.  Informed consent was obtained after describing risks and benefits of the procedure with the patient. This includes bleeding, bruising, infection, excessive weakness, or medication side effects. A REMS form is on file and signed. Needle: 30g 1/2 inch needle    Number of units per muscle:  Right temporalis 20 units, 4 access points Left temporalis 20 units,  4 access points Right frontalis 10 units, 2 access points Left frontalis 10 units, 2 access points Procerus 5 units, 1 access point Right corrugator 5 units, 1 access point Left corrugator 5 units, 1 access point Right occipitalis 15 units, 3 access points Left occipitalis 15 units, 3 access points Right cervical paraspinal 10 units, 2 access points Left cervical paraspinals 10 units, 2 access points  Right trapezius 15 units, 3 access points Left trapezius 15 units, 3 access points   Remaining 45 units was discarded  Additional 100 U was injected into left biceps muscle, 50U each in 2 locations    All injections were done after  after negative drawback for blood. The patient tolerated the procedure well. Post procedure instructions were given. A followup appointment was made.   Prescribed Zynex Nexwave heating/cooling blanket  

## 2023-04-19 NOTE — Progress Notes (Signed)
Cardiology Office Note   Date:  04/19/2023   ID:  Erika, [name] 04-08-1971, MRN [id number]  PCP:  Eden Emms, NP Cardiologist:   Lorine Bears, MD   Chief Complaint  Patient presents with   Follow-up    6 month f/u c/o right leg pain. Meds reviewed verbally with pt.      History of Present Illness: Erika [name] is a 52 y.o. female who presents for a follow-up visit  regarding extensive cardiovascular history. She has known history of coronary artery disease with previous non-ST elevation myocardial infarction in 2017.  She was found to have three-vessel coronary artery disease at that time and underwent CABG and right carotid endarterectomy. She was rehospitalized in September, 2017 with chest pain in the setting of uncontrolled hypertension. Cardiac catheterization showed patent grafts . The LAD had diffuse disease distal to the anastomosis and was very small in caliber. Ejection fraction was normal with mildly elevated left ventricular end-diastolic pressure.    She has history of difficult to control hypertension with labile blood pressure.  No evidence of renal artery stenosis on previous angiography.  She has known history of severe mixed hyperlipidemia .   She is known to have peripheral arterial disease with previous stenting of the left SFA. She was subsequently found to have occluded left SFA.  She subsequently had right leg claudication and was found to have an occluded right SFA.    She underwent repeat cardiac catheterization in September 2021 due to worsening angina.  Cardiac cath showed progression of native coronary artery disease but patent grafts with no obstructive disease.  Medical therapy was recommended.    She was hospitalized in July of 2022 with acute stroke.  She was found to have mildly elevated troponin.  Her symptoms included facial droop and left-sided weakness.  Her stroke was treated medically.  Elevated troponin was felt to be due  to demand ischemia.  Echocardiogram was done which showed normal LV systolic function with no significant valvular abnormalities.  Hospitalization was complicated by aspiration pneumonia.   She had an echocardiogram done in February of 2023 which showed normal LV systolic function with mild mitral regurgitation.  Cardiac catheterization done in February 2023 which showed significant underlying three-vessel coronary artery disease with patent grafts including LIMA to LAD, SVG to diagonal/OM and SVG to right PDA.  There was mild to moderate distal LAD disease post LIMA anastomosis.  She has been doing well with no chest pain, shortness of breath or palpitations.  She reports right calf claudication.  She does not have much feelings on the left side.  She continues to have balance issues and walks with assistance but frequently uses a wheelchair.   Past Medical History:  Diagnosis Date   Acute respiratory failure with hypoxia (HCC) 06/24/2021   Arthralgia of temporomandibular joint    CAD, multiple vessel    a. 06/2016 Cath: ostLM 40%, ostLAD 40%, pLAD 95%, ost-pLCx 60%, pLCx 95%, mLCx 60%, mRCA 95%, D2 50%, LVSF nl;  b. 07/2016 CABG x 4 (LIMA->LAD, VG->Diag, VG->OM, VG->RCA); c. 08/2016 Cath: 3VD w/ 4/4 patent grafts. LAD distal to LIMA has diff dzs->Med rx; d. 08/2020 Cath: 4/4 patent grafts, native 3VD. EF 55-65%-->Med Rx.   Carotid arterial disease (HCC)    a. 07/2016 s/p R CEA; b. 02/2021 U/S: RICA 40-59%, LICA 1-39%.   Cerebrovascular disease    Clotting disorder (HCC)    Complex sleep apnea syndrome 10/03/2022   Depression  Diastolic dysfunction    a. 06/2016 Echo: EF 50-55%, mild inf wall HK, GR1DD, mild MR, RV sys fxn nl, mildly dilated LA, PASP nl; b. 06/2021 Echo: EF 60-65%, no rwma. Nl RV fxn.   Fatty liver disease, nonalcoholic 2016   History of blood transfusion    with heart surgery   HLD (hyperlipidemia)    Labile hypertension    a. prior renal ngiogram negative for RAS in 03/2016; b.  catecholamines and metanephrines normal, mildly elevated renin with normal aldosterone and normal ratio in 02/2016   Myocardial infarction [facility]) 2017   Obesity    PAD (peripheral artery disease) (HCC)    a. 09/2018 s/p L SFA stenting; b. 07/2019 Periph Angio: Patent m/d L SFA stent w/ 100% L SFA distal to stent. L AT 100d, L Peroneal diff dzs-->Med Rx; c. 02/2021 ABIs: stable @ 0.61 on R and 0.46 on L.   PTSD (post-traumatic stress disorder)    Stroke (HCC)    Tobacco abuse    Type 2 diabetes mellitus (HCC) 12/2015    Past Surgical History:  Procedure Laterality Date   ABDOMINAL AORTOGRAM W/LOWER EXTREMITY [name] 10/15/2018   Procedure: ABDOMINAL AORTOGRAM W/LOWER EXTREMITY;  Surgeon: Iran Ouch, MD;  Location: [facility];  Service: Cardiovascular;  Laterality: [name];   ABDOMINAL AORTOGRAM W/LOWER EXTREMITY Bilateral 08/19/2019   Procedure: ABDOMINAL AORTOGRAM W/LOWER EXTREMITY;  Surgeon: Iran Ouch, MD;  Location: [facility];  Service: Cardiovascular;  Laterality: Bilateral;   CARDIAC CATHETERIZATION [name] 06/29/2016   Procedure: Left Heart Cath and Coronary Angiography;  Surgeon: Antonieta Iba, MD;  Location: [facility];  Service: Cardiovascular;  Laterality: [name];   CARDIAC CATHETERIZATION [name] 08/29/2016   Procedure: Left Heart Cath and Cors/Grafts Angiography;  Surgeon: Iran Ouch, MD;  Location: [facility];  Service: Cardiovascular;  Laterality: [name];   CESAREAN SECTION     CHOLECYSTECTOMY     CORONARY ARTERY BYPASS GRAFT [name] 07/06/2016   Procedure: CORONARY ARTERY BYPASS GRAFTING (CABG) x four, using left internal mammary artery and right leg greater saphenous vein harvested endoscopically;  Surgeon: Kerin Perna, MD;  Location: [facility] OR;  Service: Open Heart Surgery;  Laterality: [name];   ENDARTERECTOMY Right 07/06/2016   Procedure: ENDARTERECTOMY CAROTID;  Surgeon: Larina Earthly, MD;  Location: [facility] OR;  Service: Vascular;  Laterality: Right;    ENDARTERECTOMY Right 04/27/2020   Procedure: REDO OF RIGHT ENDARTERECTOMY CAROTID;  Surgeon: Larina Earthly, MD;  Location: [facility] OR;  Service: Vascular;  Laterality: Right;   LEFT HEART CATH AND CORS/GRAFTS ANGIOGRAPHY [name] 08/24/2020   Procedure: LEFT HEART CATH AND CORS/GRAFTS ANGIOGRAPHY;  Surgeon: Iran Ouch, MD;  Location: [facility];  Service: Cardiovascular;  Laterality: [name];   LEFT HEART CATH AND CORS/GRAFTS ANGIOGRAPHY [name] 01/29/2022   Procedure: LEFT HEART CATH AND CORS/GRAFTS ANGIOGRAPHY;  Surgeon: Iran Ouch, MD;  Location: [facility];  Service: Cardiovascular;  Laterality: [name];   PERIPHERAL VASCULAR CATHETERIZATION [name] 04/18/2016   Procedure: Renal Angiography;  Surgeon: Iran Ouch, MD;  Location: [facility];  Service: Cardiovascular;  Laterality: [name];   PERIPHERAL VASCULAR INTERVENTION Left 10/15/2018   Procedure: PERIPHERAL VASCULAR INTERVENTION;  Surgeon: Iran Ouch, MD;  Location: [facility];  Service: Cardiovascular;  Laterality: Left;  Left superficial femoral   TEE WITHOUT CARDIOVERSION [name] 07/06/2016   Procedure: TRANSESOPHAGEAL ECHOCARDIOGRAM (TEE);  Surgeon: Kerin Perna, MD;  Location: [facility] OR;  Service: Open Heart Surgery;  Laterality: [name];   TONSILLECTOMY       Current Outpatient Medications  Medication Sig Dispense Refill   acetaminophen (TYLENOL) 325 MG tablet Take 1-2 tablets (325-650 mg total) by mouth every 4 (four) hours as needed for mild pain.     albuterol (VENTOLIN HFA) 108 (90 Base) MCG/ACT inhaler Inhale 2 puffs into the lungs every 6 (six) hours as needed for wheezing. 18 g 0   aspirin 81 MG chewable tablet Chew 1 tablet (81 mg total) by mouth daily with breakfast. 120 tablet 1   chlorproMAZINE (THORAZINE) 50 MG tablet Take 1 tablet (50 mg total) by mouth at bedtime. 30 tablet 1   clonazePAM (KLONOPIN) 0.5 MG tablet Take 1/2 tab twice daily. No dissolving tablets. 30 tablet 1   clopidogrel (PLAVIX) 75 MG  tablet Take 1 tablet (75 mg total) by mouth daily. 90 tablet 3   Continuous Glucose Sensor (DEXCOM G7 SENSOR) MISC APPLY ONE SENSOR TO THE BACK OF YOUR UPPER ARM. REPLACE EVERY 10 DAYS. 3 each 5   cyclobenzaprine (FLEXERIL) 10 MG tablet TAKE ONE TABLET BY MOUTH AT BEDTIME 30 tablet 3   escitalopram (LEXAPRO) 20 MG tablet Take 1 tablet (20 mg total) by mouth daily. 30 tablet 1   Evolocumab (REPATHA SURECLICK) 140 MG/ML SOAJ Inject 140 mg into the skin as directed. Inject 1 Dose into the skin every 14 (fourteen) days. 6 mL 3   icosapent Ethyl (VASCEPA) 1 g capsule Take 2 g by mouth 2 (two) times daily.     insulin detemir (LEVEMIR) 100 UNIT/ML injection Inject 0.27 mLs (27 Units total) into the skin daily. 10 mL 1   lamoTRIgine (LAMICTAL) 200 MG tablet Take 1 tablet (200 mg total) by mouth at bedtime. 30 tablet 1   lamoTRIgine (LAMICTAL) 25 MG tablet Take 1 tablet (25 mg total) by mouth daily. 30 tablet 1   metoprolol succinate (TOPROL-XL) 50 MG 24 hr tablet Take 1 tablet (50 mg total) by mouth daily. Take with or immediately following a meal. 90 tablet 3   nitroGLYCERIN (NITROSTAT) 0.4 MG SL tablet Place 1 tablet (0.4 mg total) under the tongue every 5 (five) minutes as needed for chest pain. 25 tablet 1   Oxycodone HCl 10 MG TABS Take 1 tablet (10 mg total) by mouth 2 (two) times daily as needed. 60 tablet 0   pantoprazole (PROTONIX) 40 MG tablet Take 1 tablet (40 mg total) by mouth daily. 30 tablet 0   pregabalin (LYRICA) 225 MG capsule Take 1 capsule (225 mg total) by mouth 2 (two) times daily. 60 capsule 3   rosuvastatin (CRESTOR) 20 MG tablet Take 1 tablet (20 mg total) by mouth daily. 90 tablet 1   tirzepatide (MOUNJARO) 7.5 MG/0.5ML Pen Inject 7.5 mg into the skin once a week. 6 mL 0   ULTICARE MINI PEN NEEDLES 31G X 6 MM MISC USE PEN NEEDLES TWO TIMES DAILY 100 each 3   No current facility-administered medications for this visit.    Allergies:   Chantix [varenicline tartrate]    Social  History:  The patient  reports that she quit smoking about 2 years ago. Her smoking use included cigarettes. She has a 15.00 pack-year smoking history. She has never been exposed to tobacco smoke. She has never used smokeless tobacco. She reports that she does not currently use alcohol. She reports that she does not use drugs.   Family History:  The patient's family history includes Alcohol abuse in  her father; Anxiety disorder in her sister; Diabetes in her father and mother; Drug abuse in her father; Heart disease in her father; Stroke in her sister. She was adopted.    ROS:  Please see the history of present illness.   Otherwise, review of systems are positive for none.   All other systems are reviewed and negative.    PHYSICAL EXAM: VS:  BP (!) 164/94 (BP Location: Left Arm, Patient Position: Sitting, Cuff Size: Normal)   Pulse 91   Ht 5\' 6"  (1.676 m)   Wt 208 lb 4 oz (94.5 kg)   SpO2 94%   BMI 33.61 kg/m  , BMI Body mass index is 33.61 kg/m. GEN: Well nourished, well developed, in no acute distress  HEENT: normal  Neck: no JVD, carotid bruits, or masses Cardiac: RRR; no  rubs, or gallops,no edema .  No murmurs. Respiratory:  clear to auscultation bilaterally, normal work of breathing GI: soft, nontender, nondistended, + BS MS: no deformity or atrophy  Skin: warm and dry, no rash Neuro:  Strength and sensation are intact Psych: euthymic mood, full affect   EKG:  EKG is ordered today. EKG showed normal sinus rhythm with no significant ST or T wave changes.   Recent Labs: 11/30/2022: ALT 35; BUN 11; Creat 0.81; Hemoglobin 14.0; Platelets 280; Potassium 4.5; Sodium 134    Lipid Panel    Component Value Date/Time   CHOL 93 05/25/2022 1032   CHOL 121 08/21/2016 0817   TRIG 182.0 (H) 05/25/2022 1032   HDL 41.80 05/25/2022 1032   HDL 24 (L) 08/21/2016 0817   CHOLHDL 2 05/25/2022 1032   VLDL 36.4 05/25/2022 1032   LDLCALC 15 05/25/2022 1032   LDLCALC 64 08/21/2016 0817    LDLDIRECT 99.6 (H) 06/25/2021 0740      Wt Readings from Last 3 Encounters:  04/19/23 208 lb 4 oz (94.5 kg)  04/18/23 206 lb (93.4 kg)  04/11/23 206 lb (93.4 kg)         ASSESSMENT AND PLAN:  1.  Coronary artery disease involving native coronary arteries with stable angina: Status post CABG in July of 2017.  Recent cardiac catheterization last year showed patent grafts with no obstructive disease.  Continue medical therapy.    2. Peripheral arterial disease with severe bilateral calf claudication with bilateral SFA occlusion: She has moderate claudication affecting the right calf.  Most recent at Doppler in April showed an ABI of 0.59 on the right and 0.51 on the left.  I recommend continuing medical therapy.   3. Carotid artery disease status post right carotid endarterectomy.  Status post right carotid endarterectomy twice on the right.  Most recent carotid Doppler in June showed mild nonobstructive disease.  Repeat study in June of this year.   4. Essential hypertension: Blood pressure has been well-controlled but is mildly elevated today.  It was normal yesterday.  Continue Toprol.  I made no changes.  5. Hyperlipidemia: She is currently on Repatha, ezetimibe, rosuvastatin and Vascepa.  Most recent lipid profile showed an LDL of 15.  I elected to discontinue ezetimibe.    Disposition:   FU with me in 6 months   Signed,  Lorine Bears, MD  04/19/2023 3:20 PM    [facility]

## 2023-04-19 NOTE — Patient Instructions (Signed)
Medication Instructions:  STOP the Zetia  *If you need a refill on your cardiac medications before your next appointment, please call your pharmacy*   Lab Work: None ordered If you have labs (blood work) drawn today and your tests are completely normal, you will receive your results only by: MyChart Message (if you have MyChart) OR A paper copy in the mail If you have any lab test that is abnormal or we need to change your treatment, we will call you to review the results.   Testing/Procedures: Your physician has requested that you have a carotid duplex in June. This test is an ultrasound of the carotid arteries in your neck. It looks at blood flow through these arteries that supply the brain with blood.   Allow one hour for this exam.  There are no restrictions or special instructions.  This will take place at [address] [address], [place] [address]    Follow-Up: At [facility], you and your health needs are our priority.  As part of our continuing mission to provide you with exceptional heart care, we have created designated Provider Care Teams.  These Care Teams include your primary Cardiologist (physician) and Advanced Practice Providers (APPs -  Physician Assistants and Nurse Practitioners) who all work together to provide you with the care you need, when you need it.  We recommend signing up for the patient portal called "MyChart".  Sign up information is provided on this After Visit Summary.  MyChart is used to connect with patients for Virtual Visits (Telemedicine).  Patients are able to view lab/test results, encounter notes, upcoming appointments, etc.  Non-urgent messages can be sent to your provider as well.   To learn more about what you can do with MyChart, go to [email or web address].    Your next appointment:   6 month(s)  Provider:   You may see Lorine Bears, MD or one of the following Advanced Practice Providers on your  designated Care Team:   Nicolasa Ducking, NP Eula Listen, PA-C Cadence Fransico Michael, PA-C Charlsie Quest, NP

## 2023-04-26 DIAGNOSIS — G44009 Cluster headache syndrome, unspecified, not intractable: Secondary | ICD-10-CM | POA: Diagnosis not present

## 2023-04-26 DIAGNOSIS — M542 Cervicalgia: Secondary | ICD-10-CM | POA: Diagnosis not present

## 2023-04-26 DIAGNOSIS — G43909 Migraine, unspecified, not intractable, without status migrainosus: Secondary | ICD-10-CM | POA: Diagnosis not present

## 2023-04-27 DIAGNOSIS — G43909 Migraine, unspecified, not intractable, without status migrainosus: Secondary | ICD-10-CM | POA: Diagnosis not present

## 2023-04-27 DIAGNOSIS — M542 Cervicalgia: Secondary | ICD-10-CM | POA: Diagnosis not present

## 2023-04-27 DIAGNOSIS — G44009 Cluster headache syndrome, unspecified, not intractable: Secondary | ICD-10-CM | POA: Diagnosis not present

## 2023-04-28 DIAGNOSIS — G43909 Migraine, unspecified, not intractable, without status migrainosus: Secondary | ICD-10-CM | POA: Diagnosis not present

## 2023-04-28 DIAGNOSIS — M542 Cervicalgia: Secondary | ICD-10-CM | POA: Diagnosis not present

## 2023-04-28 DIAGNOSIS — G44009 Cluster headache syndrome, unspecified, not intractable: Secondary | ICD-10-CM | POA: Diagnosis not present

## 2023-05-02 ENCOUNTER — Other Ambulatory Visit: Payer: Self-pay | Admitting: Cardiovascular Disease

## 2023-05-02 ENCOUNTER — Other Ambulatory Visit: Payer: Self-pay | Admitting: Physical Medicine and Rehabilitation

## 2023-05-02 DIAGNOSIS — E7849 Other hyperlipidemia: Secondary | ICD-10-CM

## 2023-05-05 DIAGNOSIS — G4733 Obstructive sleep apnea (adult) (pediatric): Secondary | ICD-10-CM | POA: Diagnosis not present

## 2023-05-06 ENCOUNTER — Other Ambulatory Visit (HOSPITAL_COMMUNITY): Payer: Self-pay | Admitting: Psychiatry

## 2023-05-06 ENCOUNTER — Other Ambulatory Visit: Payer: Self-pay | Admitting: Physical Medicine and Rehabilitation

## 2023-05-06 DIAGNOSIS — F331 Major depressive disorder, recurrent, moderate: Secondary | ICD-10-CM

## 2023-05-06 DIAGNOSIS — F4310 Post-traumatic stress disorder, unspecified: Secondary | ICD-10-CM

## 2023-05-07 ENCOUNTER — Encounter: Payer: Self-pay | Admitting: Physical Medicine and Rehabilitation

## 2023-05-07 ENCOUNTER — Encounter
Payer: BC Managed Care – PPO | Attending: Physical Medicine and Rehabilitation | Admitting: Physical Medicine and Rehabilitation

## 2023-05-07 ENCOUNTER — Telehealth: Payer: Self-pay | Admitting: *Deleted

## 2023-05-07 VITALS — BP 126/81 | HR 91 | Ht 66.0 in | Wt 208.0 lb

## 2023-05-07 DIAGNOSIS — Z79899 Other long term (current) drug therapy: Secondary | ICD-10-CM | POA: Diagnosis not present

## 2023-05-07 DIAGNOSIS — G894 Chronic pain syndrome: Secondary | ICD-10-CM | POA: Insufficient documentation

## 2023-05-07 DIAGNOSIS — G44029 Chronic cluster headache, not intractable: Secondary | ICD-10-CM | POA: Insufficient documentation

## 2023-05-07 DIAGNOSIS — G43811 Other migraine, intractable, with status migrainosus: Secondary | ICD-10-CM | POA: Insufficient documentation

## 2023-05-07 DIAGNOSIS — I739 Peripheral vascular disease, unspecified: Secondary | ICD-10-CM | POA: Insufficient documentation

## 2023-05-07 DIAGNOSIS — E1169 Type 2 diabetes mellitus with other specified complication: Secondary | ICD-10-CM | POA: Insufficient documentation

## 2023-05-07 DIAGNOSIS — Z5181 Encounter for therapeutic drug level monitoring: Secondary | ICD-10-CM | POA: Insufficient documentation

## 2023-05-07 MED ORDER — NURTEC 75 MG PO TBDP: 1.0000 | ORAL_TABLET | ORAL | 0 refills | Status: DC

## 2023-05-07 MED ORDER — OXYCODONE HCL 10 MG PO TABS
10.0000 mg | ORAL_TABLET | Freq: Two times a day (BID) | ORAL | 0 refills | Status: DC | PRN
Start: 2023-05-07 — End: 2023-06-10

## 2023-05-07 NOTE — Telephone Encounter (Signed)
Oral swab drug screen is negative for oxycodone. She was given a warning letter about taking more than prescribed at the 03/05/23 visit. She reported the last dose was taken the night before the swab but because it was negative this report is not correct.

## 2023-05-07 NOTE — Addendum Note (Signed)
Addended by: Horton Chin on: 05/07/2023 12:37 PM   Modules accepted: Orders

## 2023-05-07 NOTE — Progress Notes (Signed)
Subjective:    Patient ID: Erika [name], female    DOB: Mar 03, 1971, 52 y.o.   MRN: [id number]  HPI Erika [name] is a 52 year old woman who presents for f/u of CVA and headaches.   1) CVA -She has been doing well at home, but at times feels she is a burden to her husband and family despite his reassurance that she is not -walking from bedroom and bathroom every day  -she walked up to 150 feet yesterday with therapy! -spouse has been able to work at home since this happened.   2) Type 2 DM: -previously sent scripts for for DexCom receiver, sensory, and transmitter- her daughter has this so she is familiar with its use -Her CBGs have recently been between 120 and 130  3) Migraines -migraines continue to be severe, present every day, despite use of oxycodone and Lyrica. -she has had benefit from Fioricet, though less than with oxycodone, but she also is less confused than with the oxycodone so her husband prefers the Fioricet to the oxycodone. -she would like to restart oxycodone  -she would like to take this at night to help her sleep.  -she prefers oxycodone to Percocet  -she is willing to try Botox -one pill of oxycodone doesn't help -she has been taking Lyrica, too much lyrica causes UTI.  -topamax did not hep -using 100% oxygen 4) Urinary incontinence -having about one episode of urinary incontinence daily, usually at night -discussed pros and cons of Botox.   5) Quadriceps weakness -she had a fall last week when using the bedside commode- her legs buckled. She has had no more falls since then  6) PAD -she would like to try Qutenza today -follows with vascular surgery and although she has PAD in both legs, she is only symptomatic in her right cald  7) Left arm weakness -she has not had any return in strength yet -tens unit has helped  -she would be interested in Vivistim eval  8) Nerve damage in left leg -has sensation -did nerve study -no pain.  -walking  with hemiwalker -daughter and mother-in-law accompany her today -can go to bedroom to den.   9) Sleep apnea -has sleep study tomorrow.   Pain Inventory Average Pain 8 Pain Right Now 8 My pain is constant, dull, and Poking pain  LOCATION OF PAIN  head  BOWEL Number of stools per week: 3 Oral laxative use Yes  Type of laxative miralax   BLADDER normal Bladder incontinence No    Mobility ability to climb steps?  yes do you drive?  no use a wheelchair needs help with transfers Do you have any goals in this area?  yes  Function disabled: date disabled .05/2021 I need assistance with the following:  dressing, bathing, toileting, meal prep, household duties, and shopping  Neuro/Psych weakness tingling trouble walking spasms depression anxiety  Prior Studies Any changes since last visit?  no  Physicians involved in your care No    Family History  Adopted: Yes  Problem Relation Age of Onset   Diabetes Mother    Diabetes Father    Alcohol abuse Father    Heart disease Father    Drug abuse Father    Stroke Sister    Anxiety disorder Sister    Social History   Socioeconomic History   Marital status: Married    Spouse name: Not on file   Number of children: 1   Years of education: 14   Highest education  level: Not on file  Occupational History   Occupation: Disability  Tobacco Use   Smoking status: Former    Packs/day: 0.50    Years: 30.00    Additional pack years: 0.00    Total pack years: 15.00    Types: Cigarettes    Quit date: 2022    Years since quitting: 2.3    Passive exposure: Never   Smokeless tobacco: Never  Vaping Use   Vaping Use: Every day   Substances: Flavoring  Substance and Sexual Activity   Alcohol use: Not Currently    Alcohol/week: 0.0 standard drinks of alcohol    Comment: socially   Drug use: No   Sexual activity: Yes    Partners: Male    Birth control/protection: None  Other Topics Concern   Not on file  Social  History Narrative   11/21/20   [name]   Living: with husband, Erika [name] (2009) and special needs daughter    Work: disability       Family: daughter - Erika [name] (special needs, lives with her) and 2 grown step children - Erika [name] and [place]rika [name]       Enjoys: play with dogs - 2 german Shepard, mut, and blue tick walker mix      Exercise: PAD limits exercise   Diet: diabetic diet and low potassium due to daughter      Safety   Seat belts: Yes    Guns: Yes  and secure   Safe in relationships: Yes    Social Determinants of Health   Financial Resource Strain: Not on file  Food Insecurity: Not on file  Transportation Needs: Not on file  Physical Activity: Not on file  Stress: Not on file  Social Connections: Not on file   Past Surgical History:  Procedure Laterality Date   ABDOMINAL AORTOGRAM W/LOWER EXTREMITY [name] 10/15/2018   Procedure: ABDOMINAL AORTOGRAM W/LOWER EXTREMITY;  Surgeon: Iran Ouch, MD;  Location: [facility];  Service: Cardiovascular;  Laterality: [name];   ABDOMINAL AORTOGRAM W/LOWER EXTREMITY Bilateral 08/19/2019   Procedure: ABDOMINAL AORTOGRAM W/LOWER EXTREMITY;  Surgeon: Iran Ouch, MD;  Location: [facility];  Service: Cardiovascular;  Laterality: Bilateral;   CARDIAC CATHETERIZATION [name] 06/29/2016   Procedure: Left Heart Cath and Coronary Angiography;  Surgeon: Antonieta Iba, MD;  Location: [facility];  Service: Cardiovascular;  Laterality: [name];   CARDIAC CATHETERIZATION [name] 08/29/2016   Procedure: Left Heart Cath and Cors/Grafts Angiography;  Surgeon: Iran Ouch, MD;  Location: [facility];  Service: Cardiovascular;  Laterality: [name];   CESAREAN SECTION     CHOLECYSTECTOMY     CORONARY ARTERY BYPASS GRAFT [name] 07/06/2016   Procedure: CORONARY ARTERY BYPASS GRAFTING (CABG) x four, using left internal mammary artery and right leg greater saphenous vein harvested endoscopically;  Surgeon: Kerin Perna, MD;   Location: [facility] OR;  Service: Open Heart Surgery;  Laterality: [name];   ENDARTERECTOMY Right 07/06/2016   Procedure: ENDARTERECTOMY CAROTID;  Surgeon: Larina Earthly, MD;  Location: [facility] OR;  Service: Vascular;  Laterality: Right;   ENDARTERECTOMY Right 04/27/2020   Procedure: REDO OF RIGHT ENDARTERECTOMY CAROTID;  Surgeon: Larina Earthly, MD;  Location: [facility] OR;  Service: Vascular;  Laterality: Right;   LEFT HEART CATH AND CORS/GRAFTS ANGIOGRAPHY [name] 08/24/2020   Procedure: LEFT HEART CATH AND CORS/GRAFTS ANGIOGRAPHY;  Surgeon: Iran Ouch, MD;  Location: [facility];  Service: Cardiovascular;  Laterality: [name];   LEFT HEART  CATH AND CORS/GRAFTS ANGIOGRAPHY [name] 01/29/2022   Procedure: LEFT HEART CATH AND CORS/GRAFTS ANGIOGRAPHY;  Surgeon: Iran Ouch, MD;  Location: [facility];  Service: Cardiovascular;  Laterality: [name];   PERIPHERAL VASCULAR CATHETERIZATION [name] 04/18/2016   Procedure: Renal Angiography;  Surgeon: Iran Ouch, MD;  Location: [facility];  Service: Cardiovascular;  Laterality: [name];   PERIPHERAL VASCULAR INTERVENTION Left 10/15/2018   Procedure: PERIPHERAL VASCULAR INTERVENTION;  Surgeon: Iran Ouch, MD;  Location: [facility];  Service: Cardiovascular;  Laterality: Left;  Left superficial femoral   TEE WITHOUT CARDIOVERSION [name] 07/06/2016   Procedure: TRANSESOPHAGEAL ECHOCARDIOGRAM (TEE);  Surgeon: Kerin Perna, MD;  Location: [facility] OR;  Service: Open Heart Surgery;  Laterality: [name];   TONSILLECTOMY     Past Medical History:  Diagnosis Date   Acute respiratory failure with hypoxia (HCC) 06/24/2021   Arthralgia of temporomandibular joint    CAD, multiple vessel    a. 06/2016 Cath: ostLM 40%, ostLAD 40%, pLAD 95%, ost-pLCx 60%, pLCx 95%, mLCx 60%, mRCA 95%, D2 50%, LVSF nl;  b. 07/2016 CABG x 4 (LIMA->LAD, VG->Diag, VG->OM, VG->RCA); c. 08/2016 Cath: 3VD w/ 4/4 patent grafts. LAD distal to LIMA has diff dzs->Med rx; d. 08/2020 Cath: 4/4 patent grafts,  native 3VD. EF 55-65%-->Med Rx.   Carotid arterial disease (HCC)    a. 07/2016 s/p R CEA; b. 02/2021 U/S: RICA 40-59%, LICA 1-39%.   Cerebrovascular disease    Clotting disorder (HCC)    Complex sleep apnea syndrome 10/03/2022   Depression    Diastolic dysfunction    a. 06/2016 Echo: EF 50-55%, mild inf wall HK, GR1DD, mild MR, RV sys fxn nl, mildly dilated LA, PASP nl; b. 06/2021 Echo: EF 60-65%, no rwma. Nl RV fxn.   Fatty liver disease, nonalcoholic 2016   History of blood transfusion    with heart surgery   HLD (hyperlipidemia)    Labile hypertension    a. prior renal ngiogram negative for RAS in 03/2016; b. catecholamines and metanephrines normal, mildly elevated renin with normal aldosterone and normal ratio in 02/2016   Myocardial infarction [facility]) 2017   Obesity    PAD (peripheral artery disease) (HCC)    a. 09/2018 s/p L SFA stenting; b. 07/2019 Periph Angio: Patent m/d L SFA stent w/ 100% L SFA distal to stent. L AT 100d, L Peroneal diff dzs-->Med Rx; c. 02/2021 ABIs: stable @ 0.61 on R and 0.46 on L.   PTSD (post-traumatic stress disorder)    Stroke [facility])    Tobacco abuse    Type 2 diabetes mellitus (HCC) 12/2015   BP 126/81   Pulse 91   Ht 5\' 6"  (1.676 m)   Wt 208 lb (94.3 kg)   SpO2 95%   BMI 33.57 kg/m   Opioid Risk Score:   Fall Risk Score:  `1  Depression screen [facility] 2/9     05/07/2023   11:56 AM 04/18/2023   10:01 AM 04/11/2023   10:38 AM 03/05/2023    1:21 PM 03/05/2023   10:13 AM 12/10/2022    8:51 AM 11/30/2022    2:13 PM  Depression screen PHQ 2/9  Decreased Interest 1 3 1 3  0 0 0  Down, Depressed, Hopeless 1 3 1 3 3  0 3  PHQ - 2 Score 2 6 2 6 3  0 3  Altered sleeping     3  3  Tired, decreased energy     3  3  Change  in appetite     0  3  Feeling bad or failure about yourself      3  3  Trouble concentrating     1  0  Moving slowly or fidgety/restless     1  0  Suicidal thoughts     0  0  PHQ-9 Score     14  15  Difficult doing work/chores     Somewhat  difficult  Extremely dIfficult     Review of Systems  Constitutional: Negative.   HENT: Negative.    Eyes: Negative.   Respiratory: Negative.    Cardiovascular: Negative.   Gastrointestinal:        Bowel control  Endocrine: Negative.   Genitourinary:        Bladder control  Musculoskeletal:  Positive for gait problem.  Skin: Negative.   Allergic/Immunologic: Negative.   Neurological:  Positive for weakness.  Hematological:  Bruises/bleeds easily.       Plavix  Psychiatric/Behavioral:  Positive for confusion and dysphoric mood. The patient is nervous/anxious.   All other systems reviewed and are negative.      Objective:   Physical Exam Gen: no distress, normal appearing HEENT: oral mucosa pink and moist, NCAT Cardio: Reg rate Chest: normal effort, normal rate of breathing Abd: soft, non-distended Ext: no edema Psych: pleasant, normal affect Skin: intact Neuro: Alert and oriented x3      Assessment & Plan:  Mrs. Helmke is a very pleasant 52 year old woman who presents for f/u of fall post-CVA, and chronic headaches.   1) CVA: Encouraged increasing walking distance every day -referred to neurology for follow-up, discussed that she was prescribed Vascepa yesterday by Dr. Epimenio Foot.  -continue home therapies -continue to push yourself every day  2) Migraines -failed multiple medications -prescribed Nurtec -present every day -discussed Botox, she is wary of getting needles in her face but will consider.  -UDS and pain contract signed -continue lyrica 225mg  BID, discussed we will try to wean next visit.  -made goal to replace soft drinks with electrolyte water -will restart oxycodone, need new urine sample and UDS today -refilled Fioricet.  -recommended Botox next visit  3) HTN: -BP is 121/75 today.  -Advised checking BP daily at home and logging results to bring into follow-up appointment with PCP and myself. -Reviewed BP meds today.  -Advised regarding  healthy foods that can help lower blood pressure and provided with a list: 1) citrus foods- high in vitamins and minerals 2) salmon and other fatty fish - reduces inflammation and oxylipins 3) swiss chard (leafy green)- high level of nitrates 4) pumpkin seeds- one of the best natural sources of magnesium 5) Beans and lentils- high in fiber, magnesium, and potassium 6) Berries- high in flavonoids 7) Amaranth (whole grain, can be cooked similarly to rice and oats)- high in magnesium and fiber 8) Pistachios- even more effective at reducing BP than other nuts 9) Carrots- high in phenolic compounds that relax blood vessels and reduce inflammation 10) Celery- contain phthalides that relax tissues of arterial walls 11) Tomatoes- can also improve cholesterol and reduce risk of heart disease 12) Broccoli- good source of magnesium, calcium, and potassium 13) Greek yogurt: high in potassium and calcium 14) Herbs and spices: Celery seed, cilantro, saffron, lemongrass, black cumin, ginseng, cinnamon, cardamom, sweet basil, and ginger 15) Chia and flax seeds- also help to lower cholesterol and blood sugar 16) Beets- high levels of nitrates that relax blood vessels  17) spinach and bananas-  high in potassium  -Provided lise of supplements that can help with hypertension:  1) magnesium: one high quality brand is Bioptemizers since it contains all 7 types of magnesium, otherwise over the counter magnesium gluconate 400mg  is a good option 2) B vitamins 3) vitamin D 4) potassium 5) CoQ10 6) L-arginine 7) Vitamin C 8) Beetroot -Educated that goal BP is 120/80. -Made goal to incorporate some of the above foods into diet.    4) Diabetes: -discussed that CBGs 130s-140s -continue monjaro -check CBGs daily, log, and bring log to follow-up appointment -try to incorporate into your diet some of the following foods which are good for diabetes: 1) cinnamon- imitates effects of insulin, increasing glucose  transport into cells ([place] or [place] cinnamon is best, least processed) 2) nuts- can slow down the blood sugar response of carbohydrate rich foods 3) oatmeal- contains and anti-inflammatory compound avenanthramide 4) whole-milk yogurt (best types are no sugar, [place] yogurt, or goat/sheep yogurt) 5) beans- high in protein, fiber, and vitamins, low glycemic index 6) broccoli- great source of vitamin A and C 7) quinoa- higher in protein and fiber than other grains 8) spinach- high in vitamin A, fiber, and protein 9) olive oil- reduces glucose levels, LDL, and triglycerides 10) salmon- excellent amount of omega-3-fatty acids 11) walnuts- rich in antioxidants 12) apples- high in fiber and quercetin 13) carrots- highly nutritious with low impact on blood sugar 14) eggs- improve HDL (good cholesterol), high in protein, keep you satiated 15) turmeric: improves blood sugars, cardiovascular disease, and protects kidney health 16) garlic: improves blood sugar, blood pressure, pain 17) tomatoes: highly nutritious with low impact on blood sugar   5) left foot drop: continue AFO ordered from [facility]  6) Left sided post-stroke shoulder pain --Discussed Sprint PNS system as an option of pain treatment via neuromodulation. Provided following link for patient to learn more about the system: https://www.sprtherapeutics.com/. Explained mechanism of activating A beta fibers which function in touch, pressure, and vibration, to inhibit the sensations of A delta and C fibers which are responsible for pain transmission.   -continue therapy -E-stim ordered due to flaccidity, atrophy, pain- discussed where e-stim should be placed  7) Quadriceps weakness contributing to fall- recommended seated quadriceps strengthening exercises daily. Continue PT and OT  8) Insomnia: resolved, discontinue trazodone. -sent script for amitriptyline 10mg  HS for 7 days  9) Multiple falls: resolved  10) PAD -Discussed Qutenza  as an option for neuropathic pain control. Discussed that this is a capsaicin patch, stronger than capsaicin cream. Discussed that it is currently approved for diabetic peripheral neuropathy and post-herpetic neuralgia, but that it has also shown benefit in treating other forms of neuropathy. Provided patient with link to site to learn more about the patch: https://www.clark.biz/. Discussed that the patch would be placed in office and benefits usually last 3 months. Discussed that unintended exposure to capsaicin can cause severe irritation of eyes, mucous membranes, respiratory tract, and skin, but that Qutenza is a local treatment and does not have the systemic side effects of other nerve medications. Discussed that there may be pain, itching, erythema, and decreased sensory function associated with the application of Qutenza. Side effects usually subside within 1 week. A cold pack of analgesic medications can help with these side effects. Blood pressure can also be increased due to pain associated with administration of the patch.   11) Dermatitis -provided referral to dermatology

## 2023-05-08 ENCOUNTER — Telehealth: Payer: Self-pay | Admitting: *Deleted

## 2023-05-08 DIAGNOSIS — G4733 Obstructive sleep apnea (adult) (pediatric): Secondary | ICD-10-CM | POA: Diagnosis not present

## 2023-05-08 NOTE — Telephone Encounter (Signed)
Nurtec PA Your information has been submitted to [employer]. [employer] is reviewing the PA request and you will receive an electronic response. You may check for the updated outcome later by reopening this request. The standard fax determination will also be sent to you directly.  If you have any questions about your PA submission, contact [employer] at [phone].

## 2023-05-10 ENCOUNTER — Telehealth (HOSPITAL_COMMUNITY): Payer: BC Managed Care – PPO | Admitting: Psychiatry

## 2023-05-12 ENCOUNTER — Encounter: Payer: Self-pay | Admitting: Physical Medicine and Rehabilitation

## 2023-05-12 LAB — DRUG TOX MONITOR 1 W/CONF, ORAL FLD
Amphetamines: NEGATIVE ng/mL (ref <10)
Barbiturates: NEGATIVE ng/mL (ref <10)
Benzodiazepines: NEGATIVE ng/mL (ref <0.50)
Buprenorphine: NEGATIVE ng/mL (ref <0.10)
Cocaine: NEGATIVE ng/mL (ref <5.0)
Cotinine: 24.8 ng/mL — ABNORMAL HIGH (ref <5.0)
Fentanyl: NEGATIVE ng/mL (ref <0.10)
Heroin Metabolite: NEGATIVE ng/mL (ref <1.0)
MARIJUANA: NEGATIVE ng/mL (ref <2.5)
MDMA: NEGATIVE ng/mL (ref <10)
Meprobamate: NEGATIVE ng/mL (ref <2.5)
Methadone: NEGATIVE ng/mL (ref <5.0)
Nicotine Metabolite: POSITIVE ng/mL — AB (ref <5.0)
Opiates: NEGATIVE ng/mL (ref <2.5)
Phencyclidine: NEGATIVE ng/mL (ref <10)
Tapentadol: NEGATIVE ng/mL (ref <5.0)
Tramadol: NEGATIVE ng/mL (ref <5.0)
Zolpidem: NEGATIVE ng/mL (ref <5.0)

## 2023-05-12 LAB — DRUG TOX ALC METAB W/CON, ORAL FLD: Alcohol Metabolite: NEGATIVE ng/mL (ref <25)

## 2023-05-13 ENCOUNTER — Telehealth (HOSPITAL_BASED_OUTPATIENT_CLINIC_OR_DEPARTMENT_OTHER): Payer: BC Managed Care – PPO | Admitting: Psychiatry

## 2023-05-13 ENCOUNTER — Encounter (HOSPITAL_COMMUNITY): Payer: Self-pay | Admitting: Psychiatry

## 2023-05-13 VITALS — Wt 208.0 lb

## 2023-05-13 DIAGNOSIS — F4310 Post-traumatic stress disorder, unspecified: Secondary | ICD-10-CM

## 2023-05-13 DIAGNOSIS — F410 Panic disorder [episodic paroxysmal anxiety] without agoraphobia: Secondary | ICD-10-CM

## 2023-05-13 DIAGNOSIS — F331 Major depressive disorder, recurrent, moderate: Secondary | ICD-10-CM

## 2023-05-13 MED ORDER — CHLORPROMAZINE HCL 25 MG PO TABS
75.0000 mg | ORAL_TABLET | Freq: Every day | ORAL | 0 refills | Status: DC
Start: 2023-05-13 — End: 2023-06-07

## 2023-05-13 MED ORDER — CLONAZEPAM 0.5 MG PO TABS
ORAL_TABLET | ORAL | 1 refills | Status: DC
Start: 2023-05-13 — End: 2023-06-14

## 2023-05-13 MED ORDER — LAMOTRIGINE 25 MG PO TABS
25.0000 mg | ORAL_TABLET | Freq: Every day | ORAL | 1 refills | Status: DC
Start: 2023-05-13 — End: 2023-07-08

## 2023-05-13 MED ORDER — LAMOTRIGINE 200 MG PO TABS: 200.0000 mg | ORAL_TABLET | Freq: Every day | ORAL | 1 refills | Status: DC

## 2023-05-13 MED ORDER — ESCITALOPRAM OXALATE 20 MG PO TABS
20.0000 mg | ORAL_TABLET | Freq: Every day | ORAL | 1 refills | Status: DC
Start: 2023-05-13 — End: 2023-07-08
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
## 2023-05-16 ENCOUNTER — Encounter (HOSPITAL_BASED_OUTPATIENT_CLINIC_OR_DEPARTMENT_OTHER): Payer: BC Managed Care – PPO | Admitting: Physical Medicine and Rehabilitation

## 2023-05-16 DIAGNOSIS — G43811 Other migraine, intractable, with status migrainosus: Secondary | ICD-10-CM

## 2023-05-16 MED ORDER — TOPIRAMATE 25 MG PO TABS
25.0000 mg | ORAL_TABLET | Freq: Every evening | ORAL | 3 refills | Status: DC
Start: 2023-05-16 — End: 2023-08-20

## 2023-05-16 NOTE — Progress Notes (Signed)
Subjective:    Patient ID: [name], female    DOB: 28-Jun-1971, 52 y.o.   MRN: [id number]  HPI An audio/video tele-health visit is felt to be the most appropriate encounter for this patient at this time. This is a follow up tele-visit via phone. The patient is at home. MD is at office. Prior to scheduling this appointment, our staff discussed the limitations of evaluation and management by telemedicine and the availability of in-person appointments. The patient expressed understanding and agreed to proceed.   [name] is a 52 year old woman who presents for f/u of CVA and headaches.   1) CVA -She has been doing well at home, but at times feels she is a burden to her husband and family despite his reassurance that she is not -walking from bedroom and bathroom every day  -she walked up to 150 feet yesterday with therapy! -spouse has been able to work at home since this happened.   2) Type 2 DM: -previously sent scripts for for DexCom receiver, sensory, and transmitter- her daughter has this so she is familiar with its use -Her CBGs have recently been between 120 and 130  3) Migraines -she would like to try topamax since Nurtec was denied -failed nerve block -migraines continue to be severe, present every day, despite use of oxycodone and Lyrica. -she has had benefit from Fioricet, though less than with oxycodone, but she also is less confused than with the oxycodone so her husband prefers the Fioricet to the oxycodone. -she would like to restart oxycodone  -she would like to take this at night to help her sleep.  -she prefers oxycodone to Percocet  -she is willing to try Botox -one pill of oxycodone doesn't help -she has been taking Lyrica, too much lyrica causes UTI.  -topamax did not hep -using 100% oxygen 4) Urinary incontinence -having about one episode of urinary incontinence daily, usually at night -discussed pros and cons of Botox.   5) Quadriceps  weakness -she had a fall last week when using the bedside commode- her legs buckled. She has had no more falls since then  6) PAD -she would like to try Qutenza today -follows with vascular surgery and although she has PAD in both legs, she is only symptomatic in her right cald  7) Left arm weakness -she has not had any return in strength yet -tens unit has helped  -she would be interested in Vivistim eval  8) Nerve damage in left leg -has sensation -did nerve study -no pain.  -walking with hemiwalker -daughter and mother-in-law accompany her today -can go to bedroom to den.   9) Sleep apnea -has sleep study tomorrow.   Pain Inventory Average Pain 8 Pain Right Now 8 My pain is constant, dull, and Poking pain  LOCATION OF PAIN  head  BOWEL Number of stools per week: 3 Oral laxative use Yes  Type of laxative miralax   BLADDER normal Bladder incontinence No    Mobility ability to climb steps?  yes do you drive?  no use a wheelchair needs help with transfers Do you have any goals in this area?  yes  Function disabled: date disabled .05/2021 I need assistance with the following:  dressing, bathing, toileting, meal prep, household duties, and shopping  Neuro/Psych weakness tingling trouble walking spasms depression anxiety  Prior Studies Any changes since last visit?  no  Physicians involved in your care No    Family History  Adopted: Yes  Problem  Relation Age of Onset   Diabetes Mother    Diabetes Father    Alcohol abuse Father    Heart disease Father    Drug abuse Father    Stroke Sister    Anxiety disorder Sister    Social History   Socioeconomic History   Marital status: Married    Spouse name: Not on file   Number of children: 1   Years of education: 14   Highest education level: Not on file  Occupational History   Occupation: Disability  Tobacco Use   Smoking status: Former    Packs/day: 0.50    Years: 30.00    Additional  pack years: 0.00    Total pack years: 15.00    Types: Cigarettes    Quit date: 2022    Years since quitting: 2.3    Passive exposure: Never   Smokeless tobacco: Never  Vaping Use   Vaping Use: Every day   Substances: Flavoring  Substance and Sexual Activity   Alcohol use: Not Currently    Alcohol/week: 0.0 standard drinks of alcohol    Comment: socially   Drug use: No   Sexual activity: Yes    Partners: Male    Birth control/protection: None  Other Topics Concern   Not on file  Social History Narrative   11/21/20   From[name]   Living: with husband, [name] (2009) and special needs daughter    Work: disability       Family: daughter - Erika [name] (special needs, lives with her) and 2 grown step children - Erika [name] and [name]       Enjoys: play with dogs - 2 german Shepard, mut, and blue tick walker mix      Exercise: PAD limits exercise   Diet: diabetic diet and low potassium due to daughter      Safety   Seat belts: Yes    Guns: Yes  and secure   Safe in relationships: Yes    Social Determinants of Health   Financial Resource Strain: Not on file  Food Insecurity: Not on file  Transportation Needs: Not on file  Physical Activity: Not on file  Stress: Not on file  Social Connections: Not on file   Past Surgical History:  Procedure Laterality Date   ABDOMINAL AORTOGRAM W/LOWER EXTREMITY [name] 10/15/2018   Procedure: ABDOMINAL AORTOGRAM W/LOWER EXTREMITY;  Surgeon: Iran Ouch, MD;  Location: [facility];  Service: Cardiovascular;  Laterality: [name];   ABDOMINAL AORTOGRAM W/LOWER EXTREMITY Bilateral 08/19/2019   Procedure: ABDOMINAL AORTOGRAM W/LOWER EXTREMITY;  Surgeon: Iran Ouch, MD;  Location: [facility];  Service: Cardiovascular;  Laterality: Bilateral;   CARDIAC CATHETERIZATION [name] 06/29/2016   Procedure: Left Heart Cath and Coronary Angiography;  Surgeon: Antonieta Iba, MD;  Location: [facility];  Service:  Cardiovascular;  Laterality: [name];   CARDIAC CATHETERIZATION [name] 08/29/2016   Procedure: Left Heart Cath and Cors/Grafts Angiography;  Surgeon: Iran Ouch, MD;  Location: [facility];  Service: Cardiovascular;  Laterality: [name];   CESAREAN SECTION     CHOLECYSTECTOMY     CORONARY ARTERY BYPASS GRAFT [name] 07/06/2016   Procedure: CORONARY ARTERY BYPASS GRAFTING (CABG) x four, using left internal mammary artery and right leg greater saphenous vein harvested endoscopically;  Surgeon: Kerin Perna, MD;  Location: [facility] OR;  Service: Open Heart Surgery;  Laterality: [name];   ENDARTERECTOMY Right 07/06/2016   Procedure: ENDARTERECTOMY CAROTID;  Surgeon: Larina Earthly, MD;  Location: [facility];  Service: Vascular;  Laterality: Right;   ENDARTERECTOMY Right 04/27/2020   Procedure: REDO OF RIGHT ENDARTERECTOMY CAROTID;  Surgeon: Larina Earthly, MD;  Location: [facility];  Service: Vascular;  Laterality: Right;   LEFT HEART CATH AND CORS/GRAFTS ANGIOGRAPHY [name] 08/24/2020   Procedure: LEFT HEART CATH AND CORS/GRAFTS ANGIOGRAPHY;  Surgeon: Iran Ouch, MD;  Location: [facility];  Service: Cardiovascular;  Laterality: [name];   LEFT HEART CATH AND CORS/GRAFTS ANGIOGRAPHY [name] 01/29/2022   Procedure: LEFT HEART CATH AND CORS/GRAFTS ANGIOGRAPHY;  Surgeon: Iran Ouch, MD;  Location: [facility];  Service: Cardiovascular;  Laterality: [name];   PERIPHERAL VASCULAR CATHETERIZATION [name] 04/18/2016   Procedure: Renal Angiography;  Surgeon: Iran Ouch, MD;  Location: [facility];  Service: Cardiovascular;  Laterality: [name];   PERIPHERAL VASCULAR INTERVENTION Left 10/15/2018   Procedure: PERIPHERAL VASCULAR INTERVENTION;  Surgeon: Iran Ouch, MD;  Location: [facility];  Service: Cardiovascular;  Laterality: Left;  Left superficial femoral   TEE WITHOUT CARDIOVERSION [name] 07/06/2016   Procedure: TRANSESOPHAGEAL ECHOCARDIOGRAM (TEE);  Surgeon: Kerin Perna, MD;  Location: [facility] OR;   Service: Open Heart Surgery;  Laterality: [name];   TONSILLECTOMY     Past Medical History:  Diagnosis Date   Acute respiratory failure with hypoxia (HCC) 06/24/2021   Arthralgia of temporomandibular joint    CAD, multiple vessel    a. 06/2016 Cath: ostLM 40%, ostLAD 40%, pLAD 95%, ost-pLCx 60%, pLCx 95%, mLCx 60%, mRCA 95%, D2 50%, LVSF nl;  b. 07/2016 CABG x 4 (LIMA->LAD, VG->Diag, VG->OM, VG->RCA); c. 08/2016 Cath: 3VD w/ 4/4 patent grafts. LAD distal to LIMA has diff dzs->Med rx; d. 08/2020 Cath: 4/4 patent grafts, native 3VD. EF 55-65%-->Med Rx.   Carotid arterial disease (HCC)    a. 07/2016 s/p R CEA; b. 02/2021 U/S: RICA 40-59%, LICA 1-39%.   Cerebrovascular disease    Clotting disorder (HCC)    Complex sleep apnea syndrome 10/03/2022   Depression    Diastolic dysfunction    a. 06/2016 Echo: EF 50-55%, mild inf wall HK, GR1DD, mild MR, RV sys fxn nl, mildly dilated LA, PASP nl; b. 06/2021 Echo: EF 60-65%, no rwma. Nl RV fxn.   Fatty liver disease, nonalcoholic 2016   History of blood transfusion    with heart surgery   HLD (hyperlipidemia)    Labile hypertension    a. prior renal ngiogram negative for RAS in 03/2016; b. catecholamines and metanephrines normal, mildly elevated renin with normal aldosterone and normal ratio in 02/2016   Myocardial infarction [facility]) 2017   Obesity    PAD (peripheral artery disease) (HCC)    a. 09/2018 s/p L SFA stenting; b. 07/2019 Periph Angio: Patent m/d L SFA stent w/ 100% L SFA distal to stent. L AT 100d, L Peroneal diff dzs-->Med Rx; c. 02/2021 ABIs: stable @ 0.61 on R and 0.46 on L.   PTSD (post-traumatic stress disorder)    Stroke (HCC)    Tobacco abuse    Type 2 diabetes mellitus (HCC) 12/2015   There were no vitals taken for this visit.  Opioid Risk Score:   Fall Risk Score:  `1  Depression screen [facility] 2/9     05/07/2023   11:56 AM 04/18/2023   10:01 AM 04/11/2023   10:38 AM 03/05/2023    1:21 PM 03/05/2023   10:13 AM 12/10/2022    8:51 AM  11/30/2022    2:13 PM  Depression screen  PHQ 2/9  Decreased Interest 1 3 1 3  0 0 0  Down, Depressed, Hopeless 1 3 1 3 3  0 3  PHQ - 2 Score 2 6 2 6 3  0 3  Altered sleeping     3  3  Tired, decreased energy     3  3  Change in appetite     0  3  Feeling bad or failure about yourself      3  3  Trouble concentrating     1  0  Moving slowly or fidgety/restless     1  0  Suicidal thoughts     0  0  PHQ-9 Score     14  15  Difficult doing work/chores     Somewhat difficult  Extremely dIfficult     Review of Systems  Constitutional: Negative.   HENT: Negative.    Eyes: Negative.   Respiratory: Negative.    Cardiovascular: Negative.   Gastrointestinal:        Bowel control  Endocrine: Negative.   Genitourinary:        Bladder control  Musculoskeletal:  Positive for gait problem.  Skin: Negative.   Allergic/Immunologic: Negative.   Neurological:  Positive for weakness.  Hematological:  Bruises/bleeds easily.       Plavix  Psychiatric/Behavioral:  Positive for confusion and dysphoric mood. The patient is nervous/anxious.   All other systems reviewed and are negative.      Objective:   Physical Exam Not performed      Assessment & Plan:  Mrs. Urata is a very pleasant 52 year old woman who presents for f/u of fall post-CVA, and chronic headaches.   1) CVA: Encouraged increasing walking distance every day -referred to neurology for follow-up, discussed that she was prescribed Vascepa yesterday by Dr. Epimenio Foot.  -continue home therapies -continue to push yourself every day  2) Migraines -failed multiple medications -prescribed Nurtec but this was denied -prescribed topamax, discussed that this can also result in weight loss -present every day -discussed Botox, she is wary of getting needles in her face but will consider.  -UDS and pain contract signed -continue lyrica 225mg  BID, discussed we will try to wean next visit.  -made goal to replace soft drinks with  electrolyte water -will restart oxycodone, need new urine sample and UDS today -refilled Fioricet.  -recommended Botox next visit  3) HTN: -BP is 121/75 today.  -Advised checking BP daily at home and logging results to bring into follow-up appointment with PCP and myself. -Reviewed BP meds today.  -Advised regarding healthy foods that can help lower blood pressure and provided with a list: 1) citrus foods- high in vitamins and minerals 2) salmon and other fatty fish - reduces inflammation and oxylipins 3) swiss chard (leafy green)- high level of nitrates 4) pumpkin seeds- one of the best natural sources of magnesium 5) Beans and lentils- high in fiber, magnesium, and potassium 6) Berries- high in flavonoids 7) Amaranth (whole grain, can be cooked similarly to rice and oats)- high in magnesium and fiber 8) Pistachios- even more effective at reducing BP than other nuts 9) Carrots- high in phenolic compounds that relax blood vessels and reduce inflammation 10) Celery- contain phthalides that relax tissues of arterial walls 11) Tomatoes- can also improve cholesterol and reduce risk of heart disease 12) Broccoli- good source of magnesium, calcium, and potassium 13) Greek yogurt: high in potassium and calcium 14) Herbs and spices: Celery seed, cilantro, saffron, lemongrass, black cumin,  ginseng, cinnamon, cardamom, sweet basil, and ginger 15) Chia and flax seeds- also help to lower cholesterol and blood sugar 16) Beets- high levels of nitrates that relax blood vessels  17) spinach and bananas- high in potassium  -Provided lise of supplements that can help with hypertension:  1) magnesium: one high quality brand is Bioptemizers since it contains all 7 types of magnesium, otherwise over the counter magnesium gluconate 400mg  is a good option 2) B vitamins 3) vitamin D 4) potassium 5) CoQ10 6) L-arginine 7) Vitamin C 8) Beetroot -Educated that goal BP is 120/80. -Made goal to incorporate  some of the above foods into diet.    4) Diabetes: -discussed that CBGs 130s-140s -continue monjaro -check CBGs daily, log, and bring log to follow-up appointment -try to incorporate into your diet some of the following foods which are good for diabetes: 1) cinnamon- imitates effects of insulin, increasing glucose transport into cells ([place] or [place] cinnamon is best, least processed) 2) nuts- can slow down the blood sugar response of carbohydrate rich foods 3) oatmeal- contains and anti-inflammatory compound avenanthramide 4) whole-milk yogurt (best types are no sugar, [place] yogurt, or goat/sheep yogurt) 5) beans- high in protein, fiber, and vitamins, low glycemic index 6) broccoli- great source of vitamin A and C 7) quinoa- higher in protein and fiber than other grains 8) spinach- high in vitamin A, fiber, and protein 9) olive oil- reduces glucose levels, LDL, and triglycerides 10) salmon- excellent amount of omega-3-fatty acids 11) walnuts- rich in antioxidants 12) apples- high in fiber and quercetin 13) carrots- highly nutritious with low impact on blood sugar 14) eggs- improve HDL (good cholesterol), high in protein, keep you satiated 15) turmeric: improves blood sugars, cardiovascular disease, and protects kidney health 16) garlic: improves blood sugar, blood pressure, pain 17) tomatoes: highly nutritious with low impact on blood sugar   5) left foot drop: continue AFO ordered from [facility]ar  6) Left sided post-stroke shoulder pain --Discussed Sprint PNS system as an option of pain treatment via neuromodulation. Provided following link for patient to learn more about the system: https://www.sprtherapeutics.com/. Explained mechanism of activating A beta fibers which function in touch, pressure, and vibration, to inhibit the sensations of A delta and C fibers which are responsible for pain transmission.   -continue therapy -E-stim ordered due to flaccidity, atrophy, pain-  discussed where e-stim should be placed  7) Quadriceps weakness contributing to fall- recommended seated quadriceps strengthening exercises daily. Continue PT and OT  8) Insomnia: resolved, discontinue trazodone. -sent script for amitriptyline 10mg  HS for 7 days  9) Multiple falls: resolved  10) PAD -Discussed Qutenza as an option for neuropathic pain control. Discussed that this is a capsaicin patch, stronger than capsaicin cream. Discussed that it is currently approved for diabetic peripheral neuropathy and post-herpetic neuralgia, but that it has also shown benefit in treating other forms of neuropathy. Provided patient with link to site to learn more about the patch: https://www.clark.biz/. Discussed that the patch would be placed in office and benefits usually last 3 months. Discussed that unintended exposure to capsaicin can cause severe irritation of eyes, mucous membranes, respiratory tract, and skin, but that Qutenza is a local treatment and does not have the systemic side effects of other nerve medications. Discussed that there may be pain, itching, erythema, and decreased sensory function associated with the application of Qutenza. Side effects usually subside within 1 week. A cold pack of analgesic medications can help with these side effects. Blood pressure can also  be increased due to pain associated with administration of the patch.   11) Dermatitis -provided referral to dermatology    7 minutes spent in discussion of topamax for migraines and that it can also result in weight loss

## 2023-05-24 ENCOUNTER — Encounter: Payer: Self-pay | Admitting: Primary Care

## 2023-05-24 ENCOUNTER — Ambulatory Visit: Payer: BC Managed Care – PPO | Admitting: Primary Care

## 2023-05-24 VITALS — BP 120/78 | HR 84 | Temp 97.3°F | Ht 66.0 in | Wt 208.0 lb

## 2023-05-24 DIAGNOSIS — J9611 Chronic respiratory failure with hypoxia: Secondary | ICD-10-CM

## 2023-05-24 DIAGNOSIS — G4731 Primary central sleep apnea: Secondary | ICD-10-CM | POA: Diagnosis not present

## 2023-05-24 DIAGNOSIS — R0602 Shortness of breath: Secondary | ICD-10-CM

## 2023-05-24 DIAGNOSIS — R918 Other nonspecific abnormal finding of lung field: Secondary | ICD-10-CM

## 2023-05-24 MED ORDER — SPIRIVA RESPIMAT 2.5 MCG/ACT IN AERS: 2.0000 | INHALATION_SPRAY | Freq: Every day | RESPIRATORY_TRACT | 0 refills | Status: DC

## 2023-05-24 NOTE — Patient Instructions (Addendum)
Recommendations: Start Spiriva Respimat- take 2 puffs daily in the morning Use Albuterol 2 puffs every 4-6 hours as needed for breakthrough shortness of breath/wheezing Continue to wear oxygen at night and with exertion to maintain O2 >88-90%  Orders: CT chest wo contrast re: abnormal CXR (ordered) Pulmonary function testing re: smoker (ordered)   Follow-up 4-6 weeks with Dr. Jayme Cloud or APP (After PFTs and CT chest)

## 2023-05-24 NOTE — Progress Notes (Deleted)
Patient seen in the office today and instructed on use of Spiriva Inhaler. Patient expressed understanding.

## 2023-05-24 NOTE — Progress Notes (Unsigned)
@Patient  ID: Erika [name], female    DOB: 1971/03/06, 53 y.o.   MRN: [id number]  No chief complaint on file.   Referring provider: Eden Emms, NP  HPI: 52 year old ex-smoker referred for evaluation of complex sleep apnea.  She had initially sought evaluation due to witnessed apneas by her husband and loud snoring.  She admits to weight gain from 182 to 225 pounds within a year after her stroke. Epworth sleepiness score is 12 Bedtime around 11 PM, she wakes up around 4 AM when her husband goes to work and finally gets out of bed in a.m. feeling tired without dryness of mouth but reports headaches There is no history suggestive of cataplexy, sleep paralysis or parasomnias   She arrives in a wheelchair today accompanied by her mother and daughter, blood pressure is 86/68 oxygen saturation is 90 percent on room air Reviewed her sleep studies Medication review shows clonazepam for anxiety, Thorazine for sleep, amitriptyline for depression and oxycodone for cluster headaches. She has previously used oxygen for headaches in the past  PMH -  Pontine stroke with residual left hemiparesis 06/2021 CABG 07/2016 Diabetes type 2, insulin requiring Peripheral arterial disease Severe anxiety on clonazepam   11/27/2022 Patient of Dr. Jayme Cloud, seen on 09/25/22 for hypoxia. Ordered for ONO and referred to sleep medicine. She saw Dr. Vassie Loll on 10/03/22 for sleep consult d/t complex sleep apnea syndrome. Central apneas are <5/hr so below threshold of significance, could b related to prior hx stroke and also use of sedating medication such as oxycodone, clonazepam and thorazine. She has mild obstructive sleep apnea. She did not tolerate CPAP in the past and during titration study central apneas seemed to emerge and persisted. She is not a candidate for oral appliance. Sleep apnea is not significant enough for hypoglossal nerve stimulation therapy. Best way to manage is to use nocturnal oxygen and  monitor symptoms.   Oxygen saturation was low during her last visit. No evidence of bronchospasm. CXR from 01/2022 did not showed any hyperinflation. Spirometry in the past showed no significant airway obstruction. No significant pulmonary hypertension on echocardiogram. Ordered for PFTs d.t long standing hx smoking. Provided oxygen for bedtime and to use as needed if O2 <88%. Coreg decreased  d/t low blood pressure  She is doing alright today. Accompanied by her husband. She has no acute complaints today. Overnight oximetry test was completed while patient was wearing 2 L of oxygen, advised to increase to 3 L at bedtime.  Will need repeat ONO. Ordered for PFTs but these have not been scheduled. She reports nasal dryness with oxygen use.   05/24/2023 Patient presents today for follow-up.  Wearing nocturnal oxygen Ordered for PFTs   She has mild obstructive sleep apnea. She did not tolerate CPAP in the past and during titration study central apneas seemed to emerge and persisted. She is not a candidate for oral appliance. Sleep apnea is not significant enough for hypoglossal nerve stimulation therapy. Best way to manage is to use nocturnal oxygen and monitor symptoms.   Spirometry in the past showed no significant airway obstruction. No significant pulmonary hypertension on echocardiogram. Ordered for PFTs d.t long standing hx smoking. Provided oxygen for bedtime and to use as needed if O2 <88%.   She had covid in Feb 2023, asymptomatic. CXR showed streraking bilateral lower lung opacity  She uses 5L oxygen at bedtime Not on CPAP, mild OSA Shortness while at rest She is not very active but is able to walk  to the restroom approx 30-43ft without signfiicant dyspnea  She has a productive cough with clear sputum She is in a wheelchair today d/t left arm hemiparesis  Denies aspiration   Recent cardiac catheterization last year showed patent grafts with no obstructive disease. Continue medical  therapy.    Significant tests/ events reviewed 07/11/22 Baseline study showed few central apneas 4/hour and total AHI 12/hour., low sat of 82%    07/2022 CPAP was titrated 5 to 13 cm, central apneas emerged and persisted  Echo 2/203 nml LVEF         Allergies  Allergen Reactions   Chantix [Varenicline Tartrate] Other (See Comments)    Feels "crazy" and angry     Immunization History  Administered Date(s) Administered   Influenza Split 09/29/2012   Influenza,inj,Quad PF,6+ Mos 10/17/2015, 09/03/2016, 09/17/2017, 09/24/2018, 09/07/2019, 03/01/2022, 11/30/2022   PNEUMOCOCCAL CONJUGATE-20 11/30/2022   Pneumococcal Polysaccharide-23 12/26/2017   Td 07/26/2010   Tdap 03/01/2022    Past Medical History:  Diagnosis Date   Acute respiratory failure with hypoxia (HCC) 06/24/2021   Arthralgia of temporomandibular joint    CAD, multiple vessel    a. 06/2016 Cath: ostLM 40%, ostLAD 40%, pLAD 95%, ost-pLCx 60%, pLCx 95%, mLCx 60%, mRCA 95%, D2 50%, LVSF nl;  b. 07/2016 CABG x 4 (LIMA->LAD, VG->Diag, VG->OM, VG->RCA); c. 08/2016 Cath: 3VD w/ 4/4 patent grafts. LAD distal to LIMA has diff dzs->Med rx; d. 08/2020 Cath: 4/4 patent grafts, native 3VD. EF 55-65%-->Med Rx.   Carotid arterial disease (HCC)    a. 07/2016 s/p R CEA; b. 02/2021 U/S: RICA 40-59%, LICA 1-39%.   Cerebrovascular disease    Clotting disorder (HCC)    Complex sleep apnea syndrome 10/03/2022   Depression    Diastolic dysfunction    a. 06/2016 Echo: EF 50-55%, mild inf wall HK, GR1DD, mild MR, RV sys fxn nl, mildly dilated LA, PASP nl; b. 06/2021 Echo: EF 60-65%, no rwma. Nl RV fxn.   Fatty liver disease, nonalcoholic 2016   History of blood transfusion    with heart surgery   HLD (hyperlipidemia)    Labile hypertension    a. prior renal ngiogram negative for RAS in 03/2016; b. catecholamines and metanephrines normal, mildly elevated renin with normal aldosterone and normal ratio in 02/2016   Myocardial infarction [facility])  2017   Obesity    PAD (peripheral artery disease) (HCC)    a. 09/2018 s/p L SFA stenting; b. 07/2019 Periph Angio: Patent m/d L SFA stent w/ 100% L SFA distal to stent. L AT 100d, L Peroneal diff dzs-->Med Rx; c. 02/2021 ABIs: stable @ 0.61 on R and 0.46 on L.   PTSD (post-traumatic stress disorder)    Stroke (HCC)    Tobacco abuse    Type 2 diabetes mellitus (HCC) 12/2015    Tobacco History: Social History   Tobacco Use  Smoking Status Former   Packs/day: 0.50   Years: 30.00   Additional pack years: 0.00   Total pack years: 15.00   Types: Cigarettes   Quit date: 2022   Years since quitting: 2.4   Passive exposure: Never  Smokeless Tobacco Never   Counseling given: Not Answered   Outpatient Medications Prior to Visit  Medication Sig Dispense Refill   acetaminophen (TYLENOL) 325 MG tablet Take 1-2 tablets (325-650 mg total) by mouth every 4 (four) hours as needed for mild pain.     albuterol (VENTOLIN HFA) 108 (90 Base) MCG/ACT inhaler Inhale 2 puffs into the lungs every  6 (six) hours as needed for wheezing. 18 g 0   aspirin 81 MG chewable tablet Chew 1 tablet (81 mg total) by mouth daily with breakfast. 120 tablet 1   chlorproMAZINE (THORAZINE) 25 MG tablet Take 3 tablets (75 mg total) by mouth at bedtime. 30 tablet 0   clonazePAM (KLONOPIN) 0.5 MG tablet Take 1/2 tab twice daily. No dissolving tablets. 30 tablet 1   clopidogrel (PLAVIX) 75 MG tablet Take 1 tablet (75 mg total) by mouth daily. 90 tablet 3   Continuous Glucose Sensor (DEXCOM G7 SENSOR) MISC APPLY ONE SENSOR TO THE BACK OF YOUR UPPER ARM. REPLACE EVERY 10 DAYS. 3 each 5   cyclobenzaprine (FLEXERIL) 10 MG tablet TAKE ONE TABLET BY MOUTH AT BEDTIME 30 tablet 3   escitalopram (LEXAPRO) 20 MG tablet Take 1 tablet (20 mg total) by mouth daily. 30 tablet 1   Evolocumab (REPATHA SURECLICK) 140 MG/ML SOAJ Inject 140 mg into the skin as directed. Inject 1 Dose into the skin every 14 (fourteen) days. 6 mL 3   icosapent  Ethyl (VASCEPA) 1 g capsule Take 2 g by mouth 2 (two) times daily.     insulin detemir (LEVEMIR) 100 UNIT/ML injection Inject 0.27 mLs (27 Units total) into the skin daily. 10 mL 1   lamoTRIgine (LAMICTAL) 200 MG tablet Take 1 tablet (200 mg total) by mouth at bedtime. 30 tablet 1   lamoTRIgine (LAMICTAL) 25 MG tablet Take 1 tablet (25 mg total) by mouth daily. 30 tablet 1   metoprolol succinate (TOPROL-XL) 50 MG 24 hr tablet Take 1 tablet (50 mg total) by mouth daily. Take with or immediately following a meal. 90 tablet 3   nitroGLYCERIN (NITROSTAT) 0.4 MG SL tablet Place 1 tablet (0.4 mg total) under the tongue every 5 (five) minutes as needed for chest pain. 25 tablet 1   Oxycodone HCl 10 MG TABS Take 1 tablet (10 mg total) by mouth 2 (two) times daily as needed. 60 tablet 0   pantoprazole (PROTONIX) 40 MG tablet TAKE ONE TABLET BY MOUTH ONE TIME DAILY 30 tablet 0   pregabalin (LYRICA) 225 MG capsule Take 1 capsule (225 mg total) by mouth 2 (two) times daily. 60 capsule 3   Rimegepant Sulfate (NURTEC) 75 MG TBDP Take 1 tablet (75 mg total) by mouth every other day. 18 tablet 0   rosuvastatin (CRESTOR) 20 MG tablet Take 1 tablet (20 mg total) by mouth daily. 90 tablet 1   tirzepatide (MOUNJARO) 7.5 MG/0.5ML Pen Inject 7.5 mg into the skin once a week. 6 mL 0   topiramate (TOPAMAX) 25 MG tablet Take 1 tablet (25 mg total) by mouth at bedtime. 90 tablet 3   ULTICARE MINI PEN NEEDLES 31G X 6 MM MISC USE PEN NEEDLES TWO TIMES DAILY 100 each 3   No facility-administered medications prior to visit.      Review of Systems  Review of Systems  Constitutional: Negative.   HENT: Negative.    Respiratory:  Positive for cough and shortness of breath.      Physical Exam  There were no vitals taken for this visit. Physical Exam Constitutional:      General: She is not in acute distress.    Appearance: Normal appearance.  Cardiovascular:     Rate and Rhythm: Normal rate and regular rhythm.   Pulmonary:     Effort: Pulmonary effort is normal.     Breath sounds: Rales present. No wheezing.     Comments: Rales right  lung base  Musculoskeletal:        General: Normal range of motion.  Skin:    General: Skin is warm and dry.  Neurological:     General: No focal deficit present.     Mental Status: She is alert and oriented to person, place, and time. Mental status is at baseline.  Psychiatric:        Mood and Affect: Mood normal.        Behavior: Behavior normal.        Thought Content: Thought content normal.        Judgment: Judgment normal.      Lab Results:  CBC    Component Value Date/Time   WBC 6.4 11/30/2022 1452   RBC 4.93 11/30/2022 1452   HGB 14.0 11/30/2022 1452   HGB 14.2 09/11/2021 1413   HCT 43.3 11/30/2022 1452   HCT 43.5 09/11/2021 1413   PLT 280 11/30/2022 1452   PLT 433 09/11/2021 1413   MCV 87.8 11/30/2022 1452   MCV 89 09/11/2021 1413   MCV 89 01/11/2015 1022   MCH 28.4 11/30/2022 1452   MCHC 32.3 11/30/2022 1452   RDW 12.8 11/30/2022 1452   RDW 12.8 09/11/2021 1413   RDW 13.8 01/11/2015 1022   LYMPHSABS 2.7 01/26/2022 1349   LYMPHSABS 2.6 09/11/2021 1413   LYMPHSABS 3.4 01/11/2014 1312   MONOABS 0.6 01/26/2022 1349   MONOABS 0.7 01/11/2014 1312   EOSABS 0.1 01/26/2022 1349   EOSABS 0.2 09/11/2021 1413   EOSABS 0.3 01/11/2014 1312   BASOSABS 0.0 01/26/2022 1349   BASOSABS 0.0 09/11/2021 1413   BASOSABS 0.1 01/11/2014 1312    BMET    Component Value Date/Time   NA 134 (L) 11/30/2022 1452   NA 138 09/11/2021 1413   NA 135 (L) 01/11/2015 1022   K 4.5 11/30/2022 1452   K 3.9 01/11/2015 1022   CL 97 (L) 11/30/2022 1452   CL 100 01/11/2015 1022   CO2 28 11/30/2022 1452   CO2 27 01/11/2015 1022   GLUCOSE 503 (HH) 11/30/2022 1452   GLUCOSE 183 (H) 01/11/2015 1022   BUN 11 11/30/2022 1452   BUN 10 09/11/2021 1413   BUN 6 (L) 01/11/2015 1022   CREATININE 0.81 11/30/2022 1452   CALCIUM 9.8 11/30/2022 1452   CALCIUM 10.0  01/11/2015 1022   GFRNONAA >60 01/29/2022 0436   GFRNONAA >60 01/11/2015 1022   GFRNONAA 41 (L) 08/10/2014 1459   GFRAA 106 01/12/2021 0934   GFRAA >60 01/11/2015 1022   GFRAA 48 (L) 08/10/2014 1459    BNP    Component Value Date/Time   BNP 351.5 (H) 01/26/2022 1733    ProBNP No results found for: "PROBNP"  Imaging: No results found.   Assessment & Plan:   No problem-specific Assessment & Plan notes found for this encounter.     Glenford Bayley, NP 05/24/2023

## 2023-05-27 DIAGNOSIS — G43909 Migraine, unspecified, not intractable, without status migrainosus: Secondary | ICD-10-CM | POA: Diagnosis not present

## 2023-05-27 DIAGNOSIS — R0600 Dyspnea, unspecified: Secondary | ICD-10-CM | POA: Insufficient documentation

## 2023-05-27 DIAGNOSIS — G44009 Cluster headache syndrome, unspecified, not intractable: Secondary | ICD-10-CM | POA: Diagnosis not present

## 2023-05-27 DIAGNOSIS — R0602 Shortness of breath: Secondary | ICD-10-CM | POA: Insufficient documentation

## 2023-05-27 DIAGNOSIS — M542 Cervicalgia: Secondary | ICD-10-CM | POA: Diagnosis not present

## 2023-05-27 NOTE — Assessment & Plan Note (Addendum)
Predominantly obstructive with few central apneas. OSA is mild, she did not tolerate CPAP and is not a candidate for oral appliance or hypoglossal nerve stimulator. Continue nocturnal oxygen and monitor symptoms.

## 2023-05-27 NOTE — Assessment & Plan Note (Addendum)
-   Former smoker.  Experiencing more dyspnea at rest. No associated wheezing or cough. She has never had full pulmonary function testing.  Spirometry in the past showed no significant airway obstruction.  Recommend trial Spiriva Respimat 2.5 mcg 2 puffs daily in the morning.  Ordered for PFTs and CT chest without contrast due to abnormal chest x-ray.

## 2023-05-28 ENCOUNTER — Ambulatory Visit: Payer: BC Managed Care – PPO | Attending: Cardiovascular Disease

## 2023-05-28 DIAGNOSIS — I779 Disorder of arteries and arterioles, unspecified: Secondary | ICD-10-CM | POA: Diagnosis not present

## 2023-05-29 ENCOUNTER — Ambulatory Visit
Admission: RE | Admit: 2023-05-29 | Discharge: 2023-05-29 | Disposition: A | Discharge status: Home / Self Care | Payer: BC Managed Care – PPO | Source: Ambulatory Visit | Attending: Nurse Practitioner | Admitting: Nurse Practitioner

## 2023-05-29 DIAGNOSIS — J181 Lobar pneumonia, unspecified organism: Secondary | ICD-10-CM | POA: Diagnosis not present

## 2023-05-29 DIAGNOSIS — R918 Other nonspecific abnormal finding of lung field: Secondary | ICD-10-CM | POA: Diagnosis not present

## 2023-05-29 DIAGNOSIS — R0602 Shortness of breath: Secondary | ICD-10-CM | POA: Diagnosis not present

## 2023-05-30 ENCOUNTER — Telehealth: Payer: Self-pay | Admitting: *Deleted

## 2023-05-30 ENCOUNTER — Ambulatory Visit: Payer: BC Managed Care – PPO | Attending: Primary Care

## 2023-05-30 NOTE — Telephone Encounter (Signed)
Oral swab drug screen was inconsistent for she reported she took prescribed medications on the day of the test. Oxycodone is absent and no metabolites present.Marland Kitchen

## 2023-05-31 ENCOUNTER — Other Ambulatory Visit: Payer: Self-pay | Admitting: *Deleted

## 2023-05-31 DIAGNOSIS — I779 Disorder of arteries and arterioles, unspecified: Secondary | ICD-10-CM

## 2023-06-01 ENCOUNTER — Other Ambulatory Visit: Payer: Self-pay | Admitting: Cardiovascular Disease

## 2023-06-01 ENCOUNTER — Other Ambulatory Visit (HOSPITAL_COMMUNITY): Payer: Self-pay | Admitting: Psychiatry

## 2023-06-01 DIAGNOSIS — F410 Panic disorder [episodic paroxysmal anxiety] without agoraphobia: Secondary | ICD-10-CM

## 2023-06-01 DIAGNOSIS — E7849 Other hyperlipidemia: Secondary | ICD-10-CM

## 2023-06-01 DIAGNOSIS — F331 Major depressive disorder, recurrent, moderate: Secondary | ICD-10-CM

## 2023-06-01 DIAGNOSIS — F4310 Post-traumatic stress disorder, unspecified: Secondary | ICD-10-CM

## 2023-06-03 ENCOUNTER — Other Ambulatory Visit (HOSPITAL_COMMUNITY): Payer: Self-pay | Admitting: Psychiatry

## 2023-06-03 DIAGNOSIS — F331 Major depressive disorder, recurrent, moderate: Secondary | ICD-10-CM

## 2023-06-03 DIAGNOSIS — F4310 Post-traumatic stress disorder, unspecified: Secondary | ICD-10-CM

## 2023-06-05 ENCOUNTER — Encounter: Payer: Self-pay | Admitting: Registered Nurse

## 2023-06-05 ENCOUNTER — Encounter: Payer: BC Managed Care – PPO | Attending: Physical Medicine and Rehabilitation | Admitting: Registered Nurse

## 2023-06-05 VITALS — BP 121/74 | HR 84 | Ht 66.0 in | Wt 199.8 lb

## 2023-06-05 DIAGNOSIS — G894 Chronic pain syndrome: Secondary | ICD-10-CM | POA: Insufficient documentation

## 2023-06-05 DIAGNOSIS — Z5181 Encounter for therapeutic drug level monitoring: Secondary | ICD-10-CM | POA: Insufficient documentation

## 2023-06-05 DIAGNOSIS — G44029 Chronic cluster headache, not intractable: Secondary | ICD-10-CM | POA: Insufficient documentation

## 2023-06-05 DIAGNOSIS — Z79899 Other long term (current) drug therapy: Secondary | ICD-10-CM | POA: Insufficient documentation

## 2023-06-05 DIAGNOSIS — G43811 Other migraine, intractable, with status migrainosus: Secondary | ICD-10-CM | POA: Insufficient documentation

## 2023-06-05 DIAGNOSIS — G4733 Obstructive sleep apnea (adult) (pediatric): Secondary | ICD-10-CM | POA: Diagnosis not present

## 2023-06-05 MED ORDER — AIMOVIG 70 MG/ML ~~LOC~~ SOAJ: 1.0000 | SUBCUTANEOUS | 1 refills | Status: DC

## 2023-06-05 NOTE — Progress Notes (Signed)
Subjective:    Patient ID: Erika [name], female    DOB: 1971/03/07, 52 y.o.   MRN: [id number]  HPI: Erika [name] is a 52 y.o. female who returns for follow up appointment for chronic pain and medication refill. She states her pain is located in her head ( migraine). She rates her pain 10. Her current exercise regime is performing stretching exercises.  Ms. Margaretmary Bayley reports no relief of her headaches with the Topamax, she has been prescribed Topamax in the past with no relief. Ms. Trathen states Botox was ineffective, insurance denied Nurtec. This provider was in contact with Dr Carlis Abbott, we will prescribe Aimovig, she verbalizes understanding.   Ms. Finnicum also reports no relief with Oxycodone, we will begin a slow weaning of Oxycodone, she was instructed to keep a headache journal and call office on Friday June 07, 2023, or My- Chart to Dr Carlis Abbott she verbalizes understanding.   Ms. Margaretmary Bayley  Morphine equivalent is 30.00 MME.   Last Oral Swab was Performed on 05/07/2023, it was inconsistent.     Pain Inventory Average Pain 10 Pain Right Now 10 My pain is aching  In the last 24 hours, has pain interfered with the following? General activity 0 Relation with others 0 Enjoyment of life 8 What TIME of day is your pain at its worst? varies Sleep (in general) Fair  Pain is worse with: inactivity Pain improves with: medication and TENS Relief from Meds: 5  Family History  Adopted: Yes  Problem Relation Age of Onset   Diabetes Mother    Diabetes Father    Alcohol abuse Father    Heart disease Father    Drug abuse Father    Stroke Sister    Anxiety disorder Sister    Social History   Socioeconomic History   Marital status: Married    Spouse name: Not on file   Number of children: 1   Years of education: 14   Highest education level: Not on file  Occupational History   Occupation: Disability  Tobacco Use   Smoking status: Former    Packs/day: 0.50     Years: 30.00    Additional pack years: 0.00    Total pack years: 15.00    Types: Cigarettes    Quit date: 2022    Years since quitting: 2.4    Passive exposure: Never   Smokeless tobacco: Never  Vaping Use   Vaping Use: Every day   Substances: Flavoring  Substance and Sexual Activity   Alcohol use: Not Currently    Alcohol/week: 0.0 standard drinks of alcohol    Comment: socially   Drug use: No   Sexual activity: Yes    Partners: Male    Birth control/protection: None  Other Topics Concern   Not on file  Social History Narrative   11/21/20   From: [name]   Living: with husband, [name] (2009) and special needs daughter    Work: disability       Family: daughter - [name] (special needs, lives with her) and 2 grown step children - [name] and [name] Client       Enjoys: play with dogs - 2 german Shepard, mut, and blue tick walker mix      Exercise: PAD limits exercise   Diet: diabetic diet and low potassium due to daughter      Safety   Seat belts: Yes    Guns: Yes  and secure   Safe in relationships: Yes  Social Determinants of Health   Financial Resource Strain: Not on file  Food Insecurity: Not on file  Transportation Needs: Not on file  Physical Activity: Not on file  Stress: Not on file  Social Connections: Not on file   Past Surgical History:  Procedure Laterality Date   ABDOMINAL AORTOGRAM W/LOWER EXTREMITY [name] 10/15/2018   Procedure: ABDOMINAL AORTOGRAM W/LOWER EXTREMITY;  Surgeon: Iran Ouch, MD;  [facility];  Service: Cardiovascular;  Laterality: [name];   ABDOMINAL AORTOGRAM W/LOWER EXTREMITY Bilateral 08/19/2019   Procedure: ABDOMINAL AORTOGRAM W/LOWER EXTREMITY;  Surgeon: Iran Ouch, MD;  [facility]: [facility];  Service: Cardiovascular;  Laterality: Bilateral;   CARDIAC CATHETERIZATION [name] 06/29/2016   Procedure: Left Heart Cath and Coronary Angiography;  Surgeon: Antonieta Iba, MD;  Location: [facility];  Service: Cardiovascular;  Laterality: [name];   CARDIAC CATHETERIZATION [name] 08/29/2016   Procedure: Left Heart Cath and Cors/Grafts Angiography;  Surgeon: Iran Ouch, MD;  [facility];  Service: Cardiovascular;  Laterality: [name];   CESAREAN SECTION     CHOLECYSTECTOMY     CORONARY ARTERY BYPASS GRAFT [name] 07/06/2016   Procedure: CORONARY ARTERY BYPASS GRAFTING (CABG) x four, using left internal mammary artery and right leg greater saphenous vein harvested endoscopically;  Surgeon: Kerin Perna, MD;  Location: [facility] OR;  Service: Open Heart Surgery;  Laterality: [name];   ENDARTERECTOMY Right 07/06/2016   Procedure: ENDARTERECTOMY CAROTID;  Surgeon: Larina Earthly, MD;  Location: [facility] OR;  Service: Vascular;  Laterality: Right;   ENDARTERECTOMY Right 04/27/2020   Procedure: REDO OF RIGHT ENDARTERECTOMY CAROTID;  Surgeon: Larina Earthly, MD;  Location: [facility] OR;  Service: Vascular;  Laterality: Right;   LEFT HEART CATH AND CORS/GRAFTS ANGIOGRAPHY [name] 08/24/2020   Procedure: LEFT HEART CATH AND CORS/GRAFTS ANGIOGRAPHY;  Surgeon: Iran Ouch, MD;  [facility];  Service: Cardiovascular;  Laterality: [name];   LEFT HEART CATH AND CORS/GRAFTS ANGIOGRAPHY [name] 01/29/2022   Procedure: LEFT HEART CATH AND CORS/GRAFTS ANGIOGRAPHY;  Surgeon: Iran Ouch, MD;  Location: [facility];  Service: Cardiovascular;  Laterality: [name];   PERIPHERAL VASCULAR CATHETERIZATION [name] 04/18/2016   Procedure: Renal Angiography;  Surgeon: Iran Ouch, MD;  [facility];  Service: Cardiovascular;  Laterality: [name];   PERIPHERAL VASCULAR INTERVENTION Left 10/15/2018   Procedure: PERIPHERAL VASCULAR INTERVENTION;  Surgeon: Iran Ouch, MD;  [facility];  Service: Cardiovascular;  Laterality: Left;  Left superficial femoral   TEE WITHOUT CARDIOVERSION [name] 07/06/2016   Procedure: TRANSESOPHAGEAL ECHOCARDIOGRAM (TEE);  Surgeon: Kerin Perna, MD;   Location[facility] OR;  Service: Open Heart Surgery;  Laterality: [name];   TONSILLECTOMY     Past Surgical History:  Procedure Laterality Date   ABDOMINAL AORTOGRAM W/LOWER EXTREMITY [name] 10/15/2018   Procedure: ABDOMINAL AORTOGRAM W/LOWER EXTREMITY;  Surgeon: Iran Ouch, MD;  [facility];  Service: Cardiovascular;  Laterality: [name];   ABDOMINAL AORTOGRAM W/LOWER EXTREMITY Bilateral 08/19/2019   Procedure: ABDOMINAL AORTOGRAM W/LOWER EXTREMITY;  Surgeon: Iran Ouch, MD;  [facility];  Service: Cardiovascular;  Laterality: Bilateral;   CARDIAC CATHETERIZATION [name] 06/29/2016   Procedure: Left Heart Cath and Coronary Angiography;  Surgeon: Antonieta Iba, MD;  Location: [facility];  Service: Cardiovascular;  Laterality: [name];   CARDIAC CATHETERIZATION [name] 08/29/2016   Procedure: Left Heart Cath and Cors/Grafts Angiography;  Surgeon: Jerolyn Center  Argentina Donovan, MD;  [facility];  Service: Cardiovascular;  Laterality: [name];   CESAREAN SECTION     CHOLECYSTECTOMY     CORONARY ARTERY BYPASS GRAFT [name] 07/06/2016   Procedure: CORONARY ARTERY BYPASS GRAFTING (CABG) x four, using left internal mammary artery and right leg greater saphenous vein harvested endoscopically;  Surgeon: Kerin Perna, MD;  [facility];  Service: Open Heart Surgery;  Laterality: [name];   ENDARTERECTOMY Right 07/06/2016   Procedure: ENDARTERECTOMY CAROTID;  Surgeon: Larina Earthly, MD;  Location: [facility] OR;  Service: Vascular;  Laterality: Right;   ENDARTERECTOMY Right 04/27/2020   Procedure: REDO OF RIGHT ENDARTERECTOMY CAROTID;  Surgeon: Larina Earthly, MD;  Location: [facility] OR;  Service: Vascular;  Laterality: Right;   LEFT HEART CATH AND CORS/GRAFTS ANGIOGRAPHY [name] 08/24/2020   Procedure: LEFT HEART CATH AND CORS/GRAFTS ANGIOGRAPHY;  Surgeon: Iran Ouch, MD;  [facility];  Service: Cardiovascular;  Laterality: [name];   LEFT HEART CATH AND CORS/GRAFTS ANGIOGRAPHY [name]  01/29/2022   Procedure: LEFT HEART CATH AND CORS/GRAFTS ANGIOGRAPHY;  Surgeon: Iran Ouch, MD;  Location: [facility];  Service: Cardiovascular;  Laterality: [name];   PERIPHERAL VASCULAR CATHETERIZATION [name] 04/18/2016   Procedure: Renal Angiography;  Surgeon: Iran Ouch, MD;  [facility];  Service: Cardiovascular;  Laterality: [name];   PERIPHERAL VASCULAR INTERVENTION Left 10/15/2018   Procedure: PERIPHERAL VASCULAR INTERVENTION;  Surgeon: Iran Ouch, MD;  [facility];  Service: Cardiovascular;  Laterality: Left;  Left superficial femoral   TEE WITHOUT CARDIOVERSION [name] 07/06/2016   Procedure: TRANSESOPHAGEAL ECHOCARDIOGRAM (TEE);  Surgeon: Kerin Perna, MD;  Location: [name] OR;  Service: Open Heart Surgery;  Laterality: [name];   TONSILLECTOMY     Past Medical History:  Diagnosis Date   Acute respiratory failure with hypoxia (HCC) 06/24/2021   Arthralgia of temporomandibular joint    CAD, multiple vessel    a. 06/2016 Cath: ostLM 40%, ostLAD 40%, pLAD 95%, ost-pLCx 60%, pLCx 95%, mLCx 60%, mRCA 95%, D2 50%, LVSF nl;  b. 07/2016 CABG x 4 (LIMA->LAD, VG->Diag, VG->OM, VG->RCA); c. 08/2016 Cath: 3VD w/ 4/4 patent grafts. LAD distal to LIMA has diff dzs->Med rx; d. 08/2020 Cath: 4/4 patent grafts, native 3VD. EF 55-65%-->Med Rx.   Carotid arterial disease (HCC)    a. 07/2016 s/p R CEA; b. 02/2021 U/S: RICA 40-59%, LICA 1-39%.   Cerebrovascular disease    Clotting disorder (HCC)    Complex sleep apnea syndrome 10/03/2022   Depression    Diastolic dysfunction    a. 06/2016 Echo: EF 50-55%, mild inf wall HK, GR1DD, mild MR, RV sys fxn nl, mildly dilated LA, PASP nl; b. 06/2021 Echo: EF 60-65%, no rwma. Nl RV fxn.   Fatty liver disease, nonalcoholic 2016   History of blood transfusion    with heart surgery   HLD (hyperlipidemia)    Labile hypertension    a. prior renal ngiogram negative for RAS in 03/2016; b. catecholamines and metanephrines normal,  mildly elevated renin with normal aldosterone and normal ratio in 02/2016   Myocardial infarction [facility]) 2017   Obesity    PAD (peripheral artery disease) (HCC)    a. 09/2018 s/p L SFA stenting; b. 07/2019 Periph Angio: Patent m/d L SFA stent w/ 100% L SFA distal to stent. L AT 100d, L Peroneal diff dzs-->Med Rx; c. 02/2021 ABIs: stable @ 0.61 on R and 0.46 on L.   PTSD (post-traumatic  stress disorder)    Stroke [facility])    Tobacco abuse    Type 2 diabetes mellitus (HCC) 12/2015   BP 121/74   Pulse 84   Ht 5\' 6"  (1.676 m)   Wt 199 lb 12.8 oz (90.6 kg)   SpO2 (!) 89%   BMI 32.25 kg/m   Opioid Risk Score:   Fall Risk Score:  `1  Depression screen [facility] 2/9     05/07/2023   11:56 AM 04/18/2023   10:01 AM 04/11/2023   10:38 AM 03/05/2023    1:21 PM 03/05/2023   10:13 AM 12/10/2022    8:51 AM 11/30/2022    2:13 PM  Depression screen PHQ 2/9  Decreased Interest 1 3 1 3  0 0 0  Down, Depressed, Hopeless 1 3 1 3 3  0 3  PHQ - 2 Score 2 6 2 6 3  0 3  Altered sleeping     3  3  Tired, decreased energy     3  3  Change in appetite     0  3  Feeling bad or failure about yourself      3  3  Trouble concentrating     1  0  Moving slowly or fidgety/restless     1  0  Suicidal thoughts     0  0  PHQ-9 Score     14  15  Difficult doing work/chores     Somewhat difficult  Extremely dIfficult     Review of Systems  Neurological:  Positive for headaches.  All other systems reviewed and are negative.     Objective:   Physical Exam Vitals and nursing note reviewed.  Constitutional:      Appearance: Normal appearance.  Cardiovascular:     Rate and Rhythm: Normal rate and regular rhythm.     Pulses: Normal pulses.     Heart sounds: Normal heart sounds.  Pulmonary:     Effort: Pulmonary effort is normal.     Breath sounds: Normal breath sounds.  Musculoskeletal:     Cervical back: Normal range of motion and neck supple.     Comments: Normal Muscle Bulk and Muscle Testing Reveals:  Upper  Extremities: Right: Full ROM and Muscle Strength 5/5  Left Upper Extremity: Paralysis  Lower Extremities: Right Full ROM and Muscle Strength 5/5 Left Lower Extremity: Decreased ROM and Muscle Strength 5/5 Arrived in wheelchair    Skin:    General: Skin is warm and dry.  Neurological:     Mental Status: She is alert and oriented to person, place, and time.  Psychiatric:        Mood and Affect: Mood normal.        Behavior: Behavior normal.         Assessment & Plan:   Cerebrovascular Accident: Neurology Following. Continue current medication regimen. Continue to Monitor. 06/05/2023 Cluster Headache:Migraines Topamax  and Botox  ineffective  RX: Aimovig 70 mg/ ml monthly.  Continue HEP as Tolerated. Continue to Monitor. 06/05/2023 Chronic Pain Refilled: Oxycodone ineffective , begin slow weaning, Ms. Engleman will call or send a MY Chart message on Friday June 07, 2023, she verbalizes understanding. We will continue the opioid monitoring program, this consists of regular clinic visits, examinations, urine drug screen, pill counts as well as use of [place] Controlled Substance Reporting system. A 12 month History has been reviewed on the [place] Controlled Substance Reporting System on 06/05/2023   Neuropathy: Continue Lyrica. Continue to Monitor.  06/05/2023  5. Insomnia: Continue Amitriptyline. Continue to Monitor. 06/05/2023   F/U in 2 months with Dr Marijean Niemann

## 2023-06-05 NOTE — Progress Notes (Signed)
Agree with the details of the visit as noted by Ames Dura, NP.  Patient is a very high risk for aspiration which may explain her findings.  Has had prior speech path evaluation and may need to revisit.  Aspiration may not be necessarily due to dysphagia but may be related to esophageal reflux with chronic silent aspiration.  Has been noted to have fluid-filled esophagus and patulous esophagus on prior imaging.  Await CT report.  Gailen Shelter, MD [facility]

## 2023-06-05 NOTE — Patient Instructions (Signed)
Start slowly weaning off Oxycodone to  , the goal is daily dose.  Call office or send a Message on Friday to Dr Carlis Abbott

## 2023-06-07 ENCOUNTER — Telehealth (HOSPITAL_COMMUNITY): Payer: Self-pay

## 2023-06-07 DIAGNOSIS — F331 Major depressive disorder, recurrent, moderate: Secondary | ICD-10-CM

## 2023-06-07 DIAGNOSIS — F4310 Post-traumatic stress disorder, unspecified: Secondary | ICD-10-CM

## 2023-06-07 MED ORDER — CHLORPROMAZINE HCL 25 MG PO TABS
75.0000 mg | ORAL_TABLET | Freq: Every day | ORAL | 1 refills | Status: DC
Start: 2023-06-07 — End: 2023-07-08

## 2023-06-07 NOTE — Telephone Encounter (Signed)
Patients pharmacy is requesting a refill of Chlorpromazine 25 mg take 3 tabs at bedtime. Last prescribed on 5/20 for #30 w/ no refill. Please review and advise, thank you

## 2023-06-07 NOTE — Telephone Encounter (Signed)
Send to [facility]. 

## 2023-06-08 DIAGNOSIS — G4733 Obstructive sleep apnea (adult) (pediatric): Secondary | ICD-10-CM | POA: Diagnosis not present

## 2023-06-09 LAB — TOXASSURE SELECT,+ANTIDEPR,UR

## 2023-06-10 ENCOUNTER — Telehealth: Payer: Self-pay

## 2023-06-10 DIAGNOSIS — G44029 Chronic cluster headache, not intractable: Secondary | ICD-10-CM

## 2023-06-10 MED ORDER — OXYCODONE HCL 10 MG PO TABS
10.0000 mg | ORAL_TABLET | Freq: Two times a day (BID) | ORAL | 0 refills | Status: DC | PRN
Start: 2023-06-10 — End: 2023-07-15

## 2023-06-10 NOTE — Telephone Encounter (Signed)
PMP was Reviewed  UDS was Reviewed.  [facility] pharmacy was called: Aimovig needs a PA, will ask the CMA to submit PA on tomorrow.  Call placed to Ms. Ackert regarding the above, Oxycodone e-scribed to pharmacy. She verbalizes understanding. She will send a My-Chart message on Wednesday, with update she verbalizes understanding.

## 2023-06-10 NOTE — Telephone Encounter (Signed)
Patient will be out Oxycodone 10 on tomorrow (Tuesday). Per patient the Lorre Nick was not approved by her insurance. Will you please send the Oxycodone 10 MG to [facility]. (Dr. Angelica Chessman is off this week). Thank you.  Filled  Written  ID  Drug  QTY  Days  Prescriber  RX #  Dispenser  Refill  Daily Dose*  Pymt Type  PMP  06/05/2023 04/11/2023 2  Pregabalin 225 Mg Capsule 60.00 30 Eu Tho [id number] Pub ([id number]) 1/3 3.02 LME Comm Ins [place] 05/10/2023 04/12/2023 2  Clonazepam 0.5 Mg Tablet 30.00 30 Sy Arf [id number] Pub ([id number]) 1/1 1.00 LME Comm Ins [place] 05/09/2023 05/07/2023 2  Oxycodone Hcl (Ir) 10 Mg Tab 60.00 30 Kr Rau [id number] Pub ([id number]) 0/0 30.00 MME Comm Ins [place]

## 2023-06-11 ENCOUNTER — Encounter: Payer: Self-pay | Admitting: Emergency Medicine

## 2023-06-11 ENCOUNTER — Other Ambulatory Visit: Payer: Self-pay | Admitting: Physical Medicine and Rehabilitation

## 2023-06-11 ENCOUNTER — Other Ambulatory Visit: Payer: Self-pay

## 2023-06-11 ENCOUNTER — Emergency Department: Payer: BC Managed Care – PPO

## 2023-06-11 ENCOUNTER — Emergency Department
Admission: EM | Admit: 2023-06-11 | Discharge: 2023-06-11 | Disposition: A | Discharge status: Home / Self Care | Payer: BC Managed Care – PPO | Source: Home / Self Care | Attending: Emergency Medicine | Admitting: Emergency Medicine

## 2023-06-11 DIAGNOSIS — Z66 Do not resuscitate: Secondary | ICD-10-CM | POA: Diagnosis not present

## 2023-06-11 DIAGNOSIS — R069 Unspecified abnormalities of breathing: Secondary | ICD-10-CM | POA: Diagnosis not present

## 2023-06-11 DIAGNOSIS — W102XXA Fall (on)(from) incline, initial encounter: Secondary | ICD-10-CM | POA: Insufficient documentation

## 2023-06-11 DIAGNOSIS — R0902 Hypoxemia: Secondary | ICD-10-CM | POA: Diagnosis not present

## 2023-06-11 DIAGNOSIS — E1165 Type 2 diabetes mellitus with hyperglycemia: Secondary | ICD-10-CM | POA: Diagnosis not present

## 2023-06-11 DIAGNOSIS — F331 Major depressive disorder, recurrent, moderate: Secondary | ICD-10-CM | POA: Diagnosis not present

## 2023-06-11 DIAGNOSIS — E861 Hypovolemia: Secondary | ICD-10-CM | POA: Diagnosis present

## 2023-06-11 DIAGNOSIS — G894 Chronic pain syndrome: Secondary | ICD-10-CM | POA: Diagnosis present

## 2023-06-11 DIAGNOSIS — Z6832 Body mass index (BMI) 32.0-32.9, adult: Secondary | ICD-10-CM | POA: Diagnosis not present

## 2023-06-11 DIAGNOSIS — I2510 Atherosclerotic heart disease of native coronary artery without angina pectoris: Secondary | ICD-10-CM | POA: Insufficient documentation

## 2023-06-11 DIAGNOSIS — I10 Essential (primary) hypertension: Secondary | ICD-10-CM | POA: Diagnosis not present

## 2023-06-11 DIAGNOSIS — S0990XA Unspecified injury of head, initial encounter: Secondary | ICD-10-CM | POA: Diagnosis not present

## 2023-06-11 DIAGNOSIS — W19XXXA Unspecified fall, initial encounter: Secondary | ICD-10-CM | POA: Diagnosis not present

## 2023-06-11 DIAGNOSIS — I25118 Atherosclerotic heart disease of native coronary artery with other forms of angina pectoris: Secondary | ICD-10-CM | POA: Diagnosis not present

## 2023-06-11 DIAGNOSIS — Z951 Presence of aortocoronary bypass graft: Secondary | ICD-10-CM | POA: Insufficient documentation

## 2023-06-11 DIAGNOSIS — E785 Hyperlipidemia, unspecified: Secondary | ICD-10-CM | POA: Diagnosis not present

## 2023-06-11 DIAGNOSIS — J9601 Acute respiratory failure with hypoxia: Secondary | ICD-10-CM | POA: Diagnosis not present

## 2023-06-11 DIAGNOSIS — A419 Sepsis, unspecified organism: Secondary | ICD-10-CM | POA: Diagnosis not present

## 2023-06-11 DIAGNOSIS — J189 Pneumonia, unspecified organism: Secondary | ICD-10-CM | POA: Diagnosis not present

## 2023-06-11 DIAGNOSIS — Z8616 Personal history of COVID-19: Secondary | ICD-10-CM | POA: Diagnosis not present

## 2023-06-11 DIAGNOSIS — W1830XA Fall on same level, unspecified, initial encounter: Secondary | ICD-10-CM | POA: Diagnosis present

## 2023-06-11 DIAGNOSIS — E119 Type 2 diabetes mellitus without complications: Secondary | ICD-10-CM | POA: Insufficient documentation

## 2023-06-11 DIAGNOSIS — F01518 Vascular dementia, unspecified severity, with other behavioral disturbance: Secondary | ICD-10-CM | POA: Diagnosis not present

## 2023-06-11 DIAGNOSIS — I110 Hypertensive heart disease with heart failure: Secondary | ICD-10-CM | POA: Diagnosis not present

## 2023-06-11 DIAGNOSIS — E1151 Type 2 diabetes mellitus with diabetic peripheral angiopathy without gangrene: Secondary | ICD-10-CM | POA: Diagnosis not present

## 2023-06-11 DIAGNOSIS — I69354 Hemiplegia and hemiparesis following cerebral infarction affecting left non-dominant side: Secondary | ICD-10-CM | POA: Insufficient documentation

## 2023-06-11 DIAGNOSIS — E860 Dehydration: Secondary | ICD-10-CM | POA: Diagnosis present

## 2023-06-11 DIAGNOSIS — R6520 Severe sepsis without septic shock: Secondary | ICD-10-CM | POA: Diagnosis not present

## 2023-06-11 DIAGNOSIS — I700 Atherosclerosis of aorta: Secondary | ICD-10-CM | POA: Diagnosis not present

## 2023-06-11 DIAGNOSIS — I5032 Chronic diastolic (congestive) heart failure: Secondary | ICD-10-CM | POA: Diagnosis not present

## 2023-06-11 DIAGNOSIS — E1169 Type 2 diabetes mellitus with other specified complication: Secondary | ICD-10-CM | POA: Diagnosis not present

## 2023-06-11 DIAGNOSIS — J9621 Acute and chronic respiratory failure with hypoxia: Secondary | ICD-10-CM | POA: Diagnosis not present

## 2023-06-11 DIAGNOSIS — S0003XA Contusion of scalp, initial encounter: Secondary | ICD-10-CM | POA: Diagnosis not present

## 2023-06-11 DIAGNOSIS — N179 Acute kidney failure, unspecified: Secondary | ICD-10-CM | POA: Diagnosis not present

## 2023-06-11 DIAGNOSIS — Z8249 Family history of ischemic heart disease and other diseases of the circulatory system: Secondary | ICD-10-CM | POA: Diagnosis not present

## 2023-06-11 DIAGNOSIS — E669 Obesity, unspecified: Secondary | ICD-10-CM | POA: Diagnosis not present

## 2023-06-11 DIAGNOSIS — G4730 Sleep apnea, unspecified: Secondary | ICD-10-CM | POA: Diagnosis present

## 2023-06-11 DIAGNOSIS — R231 Pallor: Secondary | ICD-10-CM | POA: Diagnosis not present

## 2023-06-11 DIAGNOSIS — Z043 Encounter for examination and observation following other accident: Secondary | ICD-10-CM | POA: Diagnosis not present

## 2023-06-11 DIAGNOSIS — Z794 Long term (current) use of insulin: Secondary | ICD-10-CM | POA: Diagnosis not present

## 2023-06-11 DIAGNOSIS — R0689 Other abnormalities of breathing: Secondary | ICD-10-CM | POA: Diagnosis not present

## 2023-06-11 DIAGNOSIS — R0602 Shortness of breath: Secondary | ICD-10-CM | POA: Diagnosis not present

## 2023-06-11 DIAGNOSIS — G4700 Insomnia, unspecified: Secondary | ICD-10-CM | POA: Diagnosis present

## 2023-06-11 DIAGNOSIS — I959 Hypotension, unspecified: Secondary | ICD-10-CM | POA: Diagnosis not present

## 2023-06-11 DIAGNOSIS — R918 Other nonspecific abnormal finding of lung field: Secondary | ICD-10-CM | POA: Diagnosis not present

## 2023-06-11 MED ORDER — OXYCODONE HCL 5 MG PO TABS
5.0000 mg | ORAL_TABLET | Freq: Once | ORAL | Status: AC
  Administered 2023-06-11: 5 mg via ORAL
  Filled 2023-06-11: qty 1

## 2023-06-11 MED ORDER — ACETAMINOPHEN 325 MG PO TABS
650.0000 mg | ORAL_TABLET | Freq: Once | ORAL | Status: DC
  Filled 2023-06-11: qty 2

## 2023-06-11 NOTE — ED Provider Notes (Signed)
[facility] Provider Note    Event Date/Time   First MD Initiated Contact with Patient 06/11/23 1313     (approximate)   History   Fall   HPI  Erika [name] is a 52 y.o. female with a past medical history of vascular dementia, depression, CVA with residual left-sided weakness and dependence on a walker, GERD, PTSD, CHF, diabetes, CAD who presents today for evaluation after a fall.  Patient reports that she was walking down a ramp and her toe caught the lip at the end of the ramp and she fell, striking her head.  She reports that she takes aspirin, Plavix, and Eliquis.  She denies LOC.  She has not had any visual changes.  She feels slightly nauseated has not had any vomiting.  She reports that she is a headache.  There is no other injury sustained.  Patient Active Problem List   Diagnosis Date Noted   Dyspnea 05/27/2023   Adventitious breath sounds 03/05/2023   Chronic respiratory failure with hypoxia (HCC) 10/03/2022   Complex sleep apnea syndrome 10/03/2022   Scalp psoriasis 06/11/2022   Coccyx pain 06/11/2022   History of tobacco abuse 05/25/2022   Dermatitis 03/19/2022   Mild vascular dementia without behavioral disturbance, psychotic disturbance, mood disturbance, or anxiety (HCC) 03/01/2022   Acute metabolic encephalopathy/delirium/polypharmacy 01/31/2022   Demand ischemia    Pressure injury of skin 01/27/2022   Cardiomegaly    Polypharmacy    COVID-19 virus infection 12/22/2021   Hyperglycemia 12/22/2021   Major depressive disorder, recurrent episode, mild (HCC) 11/26/2021   Ankle fracture, left    Closed avulsion fracture of medial malleolus of left tibia    Cerebrovascular accident (CVA) due to occlusion of vertebral artery (HCC) 09/11/2021   Cognitive change 09/11/2021   Cognitive communication deficit 08/10/2021   Urinary incontinence 08/10/2021   GERD (gastroesophageal reflux disease) 08/03/2021   Chronic post-traumatic stress  disorder (PTSD)    Depression    Right pontine stroke (HCC) 06/28/2021   Hemiplegia and hemiparesis following cerebral infarction affecting left non-dominant side (HCC) 06/24/2021   Chronic diastolic CHF (congestive heart failure) (HCC) 06/24/2021   Fall 06/24/2021   Abnormal LFTs 06/24/2021   Olecranon bursitis of left elbow 06/12/2021   Chronic hip pain (Bilateral) 06/09/2021   Chronic use of opiate for therapeutic purpose 05/09/2021   Greater trochanteric bursitis of hip (Left) 03/09/2021   Bursitis of hip (Left) 02/23/2021   Uncomplicated opioid dependence (HCC) 02/20/2021   Acute conjunctivitis of left eye 01/02/2021   Unintentional weight loss 11/21/2020   Broken teeth (Right) 08/02/2020   History of MI (myocardial infarction) (July 2017) 08/02/2020   Neuropathy 06/09/2020   Dental abscess 06/09/2020   Foot drop (Left) 06/09/2020   Carotid stenosis, asymptomatic, right 04/27/2020   Migraine, chronic, without aura 11/03/2019   Thrombocytosis 09/16/2019   Erythrocytosis 09/16/2019   Hypercalcemia 09/06/2019   Leukocytosis 09/06/2019   Polycythemia 09/06/2019   Chronic hip pain (Left) 08/27/2019   Osteoarthritis of hip (Left) 08/27/2019   Gluteal tendonitis of buttock (Left) 08/27/2019   Radial nerve palsy (Right) 07/15/2019   Neuropathy of radial nerve (Right) 06/30/2019   Abnormal bruising 06/25/2019   History of carotid endarterectomy (Right) 03/04/2019   Pain medication agreement signed 03/04/2019   Atypical facial pain (Right) 01/27/2019   Chronic ear pain (Right) 01/27/2019   Chronic jaw pain (Right) 01/27/2019   Geniculate Neuralgia (Right) 01/27/2019   Vitamin D deficiency 01/19/2019   Neurogenic pain 01/19/2019  Chronic anticoagulation (PLAVIX) 01/19/2019   Chronic pain syndrome 01/07/2019   Long term current use of opiate analgesic 01/07/2019   Long term prescription benzodiazepine use 01/07/2019   Pharmacologic therapy 01/07/2019   Disorder of skeletal  system 01/07/2019   Problems influencing health status 01/07/2019   Chronic headaches (1ry area of Pain) (Right) 01/07/2019   Opiate use 09/24/2018   PVD (peripheral vascular disease) (HCC) 06/24/2018   Chronic ankle pain (Bilateral) 12/27/2017   Tendinopathy of gluteus medius (Right) 12/27/2017   Tendinopathy of gluteus medius (Left) 12/27/2017   Bilateral hip pain 12/26/2017   Chronic elbow pain (Left) 10/17/2017   Elevated troponin I level 05/21/2017   Major depressive disorder, recurrent episode, moderate (HCC) 11/19/2016   Insomnia 10/30/2016   Constipation 07/25/2016   S/P CABG x 4 07/06/2016   Bradycardia    CAD (coronary artery disease)    Carotid stenosis    CAD in native artery 06/29/2016   Elevated troponin 06/28/2016   Essential hypertension, malignant 06/28/2016   Mild tobacco abuse in early remission 06/28/2016   Essential hypertension    Malignant hypertension    Type 2 diabetes mellitus with other specified complication (HCC)    Chest pain with high risk for cardiac etiology 06/27/2016   NSTEMI (non-ST elevated myocardial infarction) (HCC) 06/27/2016   Proteinuria 03/15/2016   Renal artery stenosis (HCC) 03/15/2016   MDD (major depressive disorder) 10/17/2015   Agoraphobia with panic attacks 04/25/2015   HTN (hypertension), malignant 10/20/2013   Debility 04/02/2013   Cluster headache 03/20/2012   HLD (hyperlipidemia) 07/26/2010          Physical Exam   Triage Vital Signs: ED Triage Vitals  Enc Vitals Group     BP 06/11/23 1238 (!) 140/78     Pulse Rate 06/11/23 1238 88     Resp 06/11/23 1238 18     Temp 06/11/23 1238 98.3 F (36.8 C)     Temp Source 06/11/23 1238 Oral     SpO2 06/11/23 1238 91 %     Weight 06/11/23 1239 199 lb 11.8 oz (90.6 kg)     Height 06/11/23 1239 5\' 6"  (1.676 m)     Head Circumference --      Peak Flow --      Pain Score 06/11/23 1239 4     Pain Loc --      Pain Edu? --      Excl. in GC? --     Most recent vital  signs: Vitals:   06/11/23 1238  BP: (!) 140/78  Pulse: 88  Resp: 18  Temp: 98.3 F (36.8 C)  SpO2: 91%    Physical Exam Vitals and nursing note reviewed.  Constitutional:      General: Awake and alert. No acute distress.    Appearance: Normal appearance. The patient is obese.  HENT:     Head: Normocephalic.  No Battle sign or raccoon eyes.  There is a hematoma to the occiput.  No open wounds    Mouth: Mucous membranes are moist.  Eyes:     General: PERRL. Normal EOMs        Right eye: No discharge.        Left eye: No discharge.     Conjunctiva/sclera: Conjunctivae normal.  Cardiovascular:     Rate and Rhythm: Normal rate and regular rhythm.     Pulses: Normal pulses.  Pulmonary:     Effort: Pulmonary effort is normal. No respiratory distress.  Breath sounds: Normal breath sounds.  Abdominal:     Abdomen is soft. There is no abdominal tenderness. No rebound or guarding. No distention. Musculoskeletal:        General: No swelling. Normal range of motion.     Cervical back: Normal range of motion and neck supple.  Skin:    General: Skin is warm and dry.     Capillary Refill: Capillary refill takes less than 2 seconds.     Findings: No rash.  Neurological:     Mental Status: The patient is awake and alert.  At her neurologic baseline     ED Results / Procedures / Treatments   Labs (all labs ordered are listed, but only abnormal results are displayed) Labs Reviewed - No data to display   EKG     RADIOLOGY I independently reviewed and interpreted imaging and agree with radiologists findings.     PROCEDURES:  Critical Care performed:   Procedures   MEDICATIONS ORDERED IN ED: Medications  acetaminophen (TYLENOL) tablet 650 mg (650 mg Oral Not Given 06/11/23 1339)  oxyCODONE (Oxy IR/ROXICODONE) immediate release tablet 5 mg (5 mg Oral Given 06/11/23 1347)     IMPRESSION / MDM / ASSESSMENT AND PLAN / ED COURSE  I reviewed the triage vital signs and  the nursing notes.   Differential diagnosis includes, but is not limited to, intracranial hemorrhage, hematoma, concussion, contusion, cervical spine injury.  Patient is awake and alert, hemodynamically stable and neurologically at her baseline.  She has chronic left-sided weakness from her previous stroke which is unchanged today.  CT head and neck obtained per [place] criteria given that she is anticoagulated.  This is negative for any acute intracranial findings, though does reveal a scalp hematoma.  This does not appear to be actively enlarging throughout her emergency department stay.  Recommended cool compresses to this area.  We discussed the small possibility of a delayed head bleed given that she is on anticoagulation.  We also discussed concussion precautions and brain rest.  We discussed return precautions. Patient understands and agrees with plan.  She was discharged in stable condition with her family member.  She is ambulatory at her baseline gait.   Patient's presentation is most consistent with acute presentation with potential threat to life or bodily function.    FINAL CLINICAL IMPRESSION(S) / ED DIAGNOSES   Final diagnoses:  Fall, initial encounter  Injury of head, initial encounter  Hematoma of scalp, initial encounter     Rx / DC Orders   ED Discharge Orders     None        Note:  This document was prepared using Dragon voice recognition software and may include unintentional dictation errors.   Keturah Shavers 06/11/23 1430    Chesley Noon, MD 06/11/23 1616

## 2023-06-11 NOTE — ED Provider Triage Note (Signed)
Emergency Medicine Provider Triage Evaluation Note  Erika [name] , a 52 y.o. female  was evaluated in triage.  Pt complains of fall today. Mechanical fall.  Hit head on pavement.     Review of Systems  Positive: + blood thinner Negative: No LOC, no N/V or vision changes  Physical Exam  There were no vitals taken for this visit. Gen:   Awake, no distress  Alert, talkative.  Speech normal Resp:  Normal effort  MSK:   Moves extremities without difficulty  Other:  Scalp edema and tenderness posteriorly.  No tender c spine to palpation.   Medical Decision Making  Medically screening exam initiated at 12:38 PM.  Appropriate orders placed.  TELIAH BARBIERI was informed that the remainder of the evaluation will be completed by another provider, this initial triage assessment does not replace that evaluation, and the importance of remaining in the ED until their evaluation is complete.     Tommi Rumps, PA-C 06/11/23 1242

## 2023-06-11 NOTE — Discharge Instructions (Signed)
You CT head and neck were negative for any acute intracranial or cervical spine injuries.  You may continue to apply ice to the hematoma on her head.  Please return for any new, worsening, or change in symptoms or other concerns.

## 2023-06-11 NOTE — ED Triage Notes (Signed)
Pt here via [facility] from home after a fall today. Pt was helping a family member get in the car and she tripped and fell and hit the back of her head. Pt denies LOC or blood thinner.

## 2023-06-11 NOTE — ED Notes (Signed)
See triage note Presents s/p fall  States she fell in parking   hitting her head  No LOC

## 2023-06-12 ENCOUNTER — Other Ambulatory Visit: Payer: Self-pay

## 2023-06-12 ENCOUNTER — Inpatient Hospital Stay: Payer: BC Managed Care – PPO

## 2023-06-12 ENCOUNTER — Inpatient Hospital Stay
Admission: EM | Admit: 2023-06-12 | Discharge: 2023-06-14 | DRG: 871 | Disposition: A | Discharge status: Home / Self Care | Payer: BC Managed Care – PPO | Attending: Student in an Organized Health Care Education/Training Program | Admitting: Student in an Organized Health Care Education/Training Program

## 2023-06-12 ENCOUNTER — Telehealth: Payer: Self-pay | Admitting: Primary Care

## 2023-06-12 ENCOUNTER — Emergency Department: Payer: BC Managed Care – PPO

## 2023-06-12 DIAGNOSIS — G894 Chronic pain syndrome: Secondary | ICD-10-CM | POA: Diagnosis present

## 2023-06-12 DIAGNOSIS — I2510 Atherosclerotic heart disease of native coronary artery without angina pectoris: Secondary | ICD-10-CM | POA: Diagnosis present

## 2023-06-12 DIAGNOSIS — E861 Hypovolemia: Secondary | ICD-10-CM | POA: Diagnosis present

## 2023-06-12 DIAGNOSIS — W1830XA Fall on same level, unspecified, initial encounter: Secondary | ICD-10-CM | POA: Diagnosis present

## 2023-06-12 DIAGNOSIS — Z8616 Personal history of COVID-19: Secondary | ICD-10-CM | POA: Diagnosis not present

## 2023-06-12 DIAGNOSIS — Z87891 Personal history of nicotine dependence: Secondary | ICD-10-CM

## 2023-06-12 DIAGNOSIS — E669 Obesity, unspecified: Secondary | ICD-10-CM | POA: Diagnosis present

## 2023-06-12 DIAGNOSIS — F01518 Vascular dementia, unspecified severity, with other behavioral disturbance: Secondary | ICD-10-CM | POA: Diagnosis present

## 2023-06-12 DIAGNOSIS — Z7985 Long-term (current) use of injectable non-insulin antidiabetic drugs: Secondary | ICD-10-CM

## 2023-06-12 DIAGNOSIS — Z66 Do not resuscitate: Secondary | ICD-10-CM | POA: Diagnosis present

## 2023-06-12 DIAGNOSIS — N179 Acute kidney failure, unspecified: Secondary | ICD-10-CM | POA: Diagnosis present

## 2023-06-12 DIAGNOSIS — F331 Major depressive disorder, recurrent, moderate: Secondary | ICD-10-CM | POA: Diagnosis not present

## 2023-06-12 DIAGNOSIS — Z8249 Family history of ischemic heart disease and other diseases of the circulatory system: Secondary | ICD-10-CM | POA: Diagnosis not present

## 2023-06-12 DIAGNOSIS — G4730 Sleep apnea, unspecified: Secondary | ICD-10-CM | POA: Diagnosis present

## 2023-06-12 DIAGNOSIS — R6520 Severe sepsis without septic shock: Secondary | ICD-10-CM | POA: Diagnosis not present

## 2023-06-12 DIAGNOSIS — R0902 Hypoxemia: Secondary | ICD-10-CM | POA: Diagnosis present

## 2023-06-12 DIAGNOSIS — A419 Sepsis, unspecified organism: Secondary | ICD-10-CM | POA: Diagnosis not present

## 2023-06-12 DIAGNOSIS — I69354 Hemiplegia and hemiparesis following cerebral infarction affecting left non-dominant side: Secondary | ICD-10-CM | POA: Diagnosis not present

## 2023-06-12 DIAGNOSIS — J9601 Acute respiratory failure with hypoxia: Secondary | ICD-10-CM | POA: Diagnosis not present

## 2023-06-12 DIAGNOSIS — S0003XA Contusion of scalp, initial encounter: Secondary | ICD-10-CM | POA: Diagnosis present

## 2023-06-12 DIAGNOSIS — F410 Panic disorder [episodic paroxysmal anxiety] without agoraphobia: Secondary | ICD-10-CM

## 2023-06-12 DIAGNOSIS — E1165 Type 2 diabetes mellitus with hyperglycemia: Secondary | ICD-10-CM | POA: Diagnosis present

## 2023-06-12 DIAGNOSIS — I5032 Chronic diastolic (congestive) heart failure: Secondary | ICD-10-CM | POA: Diagnosis present

## 2023-06-12 DIAGNOSIS — E1169 Type 2 diabetes mellitus with other specified complication: Secondary | ICD-10-CM | POA: Diagnosis not present

## 2023-06-12 DIAGNOSIS — E1151 Type 2 diabetes mellitus with diabetic peripheral angiopathy without gangrene: Secondary | ICD-10-CM | POA: Diagnosis present

## 2023-06-12 DIAGNOSIS — I252 Old myocardial infarction: Secondary | ICD-10-CM

## 2023-06-12 DIAGNOSIS — Z823 Family history of stroke: Secondary | ICD-10-CM

## 2023-06-12 DIAGNOSIS — Z7902 Long term (current) use of antithrombotics/antiplatelets: Secondary | ICD-10-CM

## 2023-06-12 DIAGNOSIS — I25118 Atherosclerotic heart disease of native coronary artery with other forms of angina pectoris: Secondary | ICD-10-CM | POA: Diagnosis not present

## 2023-06-12 DIAGNOSIS — Z7982 Long term (current) use of aspirin: Secondary | ICD-10-CM

## 2023-06-12 DIAGNOSIS — I110 Hypertensive heart disease with heart failure: Secondary | ICD-10-CM | POA: Diagnosis present

## 2023-06-12 DIAGNOSIS — E860 Dehydration: Secondary | ICD-10-CM | POA: Diagnosis present

## 2023-06-12 DIAGNOSIS — Z794 Long term (current) use of insulin: Secondary | ICD-10-CM

## 2023-06-12 DIAGNOSIS — Z951 Presence of aortocoronary bypass graft: Secondary | ICD-10-CM

## 2023-06-12 DIAGNOSIS — J189 Pneumonia, unspecified organism: Secondary | ICD-10-CM | POA: Diagnosis not present

## 2023-06-12 DIAGNOSIS — F4310 Post-traumatic stress disorder, unspecified: Secondary | ICD-10-CM | POA: Diagnosis present

## 2023-06-12 DIAGNOSIS — G4700 Insomnia, unspecified: Secondary | ICD-10-CM | POA: Diagnosis present

## 2023-06-12 DIAGNOSIS — I10 Essential (primary) hypertension: Secondary | ICD-10-CM | POA: Diagnosis not present

## 2023-06-12 DIAGNOSIS — Z833 Family history of diabetes mellitus: Secondary | ICD-10-CM

## 2023-06-12 DIAGNOSIS — E785 Hyperlipidemia, unspecified: Secondary | ICD-10-CM | POA: Diagnosis present

## 2023-06-12 DIAGNOSIS — J9621 Acute and chronic respiratory failure with hypoxia: Secondary | ICD-10-CM | POA: Diagnosis present

## 2023-06-12 DIAGNOSIS — Z6832 Body mass index (BMI) 32.0-32.9, adult: Secondary | ICD-10-CM | POA: Diagnosis not present

## 2023-06-12 DIAGNOSIS — K219 Gastro-esophageal reflux disease without esophagitis: Secondary | ICD-10-CM | POA: Diagnosis present

## 2023-06-12 LAB — COMPREHENSIVE METABOLIC PANEL
ALT: 12 U/L (ref 0–44)
AST: 19 U/L (ref 15–41)
Albumin: 4.1 g/dL (ref 3.5–5.0)
Alkaline Phosphatase: 82 U/L (ref 38–126)
Anion gap: 10 (ref 5–15)
BUN: 16 mg/dL (ref 6–20)
CO2: 27 mmol/L (ref 22–32)
Calcium: 9.9 mg/dL (ref 8.9–10.3)
Chloride: 99 mmol/L (ref 98–111)
Creatinine, Ser: 1.26 mg/dL — ABNORMAL HIGH (ref 0.44–1.00)
GFR, Estimated: 52 mL/min — ABNORMAL LOW (ref >60)
Glucose, Bld: 179 mg/dL — ABNORMAL HIGH (ref 70–99)
Potassium: 3.9 mmol/L (ref 3.5–5.1)
Sodium: 136 mmol/L (ref 135–145)
Total Bilirubin: 0.6 mg/dL (ref 0.3–1.2)
Total Protein: 8.2 g/dL — ABNORMAL HIGH (ref 6.5–8.1)

## 2023-06-12 LAB — CBC WITH DIFFERENTIAL/PLATELET
Abs Immature Granulocytes: 0.09 10*3/uL — ABNORMAL HIGH (ref 0.00–0.07)
Basophils Absolute: 0.1 10*3/uL (ref 0.0–0.1)
Basophils Relative: 0 %
Eosinophils Absolute: 0.1 10*3/uL (ref 0.0–0.5)
Eosinophils Relative: 1 %
HCT: 43.3 % (ref 36.0–46.0)
Hemoglobin: 12.4 g/dL (ref 12.0–15.0)
Immature Granulocytes: 0 %
Lymphocytes Relative: 7 %
Lymphs Abs: 1.5 10*3/uL (ref 0.7–4.0)
MCH: 20.9 pg — ABNORMAL LOW (ref 26.0–34.0)
MCHC: 28.6 g/dL — ABNORMAL LOW (ref 30.0–36.0)
MCV: 73 fL — ABNORMAL LOW (ref 80.0–100.0)
Monocytes Absolute: 0.4 10*3/uL (ref 0.1–1.0)
Monocytes Relative: 2 %
Neutro Abs: 20 10*3/uL — ABNORMAL HIGH (ref 1.7–7.7)
Neutrophils Relative %: 90 %
Platelets: 567 10*3/uL — ABNORMAL HIGH (ref 150–400)
RBC: 5.93 MIL/uL — ABNORMAL HIGH (ref 3.87–5.11)
RDW: 17.9 % — ABNORMAL HIGH (ref 11.5–15.5)
WBC: 22.1 10*3/uL — ABNORMAL HIGH (ref 4.0–10.5)
nRBC: 0 % (ref 0.0–0.2)

## 2023-06-12 LAB — LACTIC ACID, PLASMA
Lactic Acid, Venous: 2.3 mmol/L (ref 0.5–1.9)
Lactic Acid, Venous: 2 mmol/L (ref 0.5–1.9)

## 2023-06-12 LAB — BLOOD GAS, VENOUS
Acid-Base Excess: 4 mmol/L — ABNORMAL HIGH (ref 0.0–2.0)
Bicarbonate: 31.6 mmol/L — ABNORMAL HIGH (ref 20.0–28.0)
FIO2: 100 %
O2 Saturation: 57 %
Patient temperature: 37.7
pCO2, Ven: 60 mmHg (ref 44–60)
pH, Ven: 7.33 (ref 7.25–7.43)
pO2, Ven: 36 mmHg (ref 32–45)

## 2023-06-12 LAB — BRAIN NATRIURETIC PEPTIDE: B Natriuretic Peptide: 17.3 pg/mL (ref 0.0–100.0)

## 2023-06-12 LAB — TROPONIN I (HIGH SENSITIVITY)
Troponin I (High Sensitivity): 8 ng/L (ref <18)
Troponin I (High Sensitivity): 8 ng/L (ref <18)

## 2023-06-12 LAB — CBG MONITORING, ED
Glucose-Capillary: 188 mg/dL — ABNORMAL HIGH (ref 70–99)
Glucose-Capillary: 201 mg/dL — ABNORMAL HIGH (ref 70–99)

## 2023-06-12 LAB — CULTURE, BLOOD (ROUTINE X 2): Culture: NO GROWTH

## 2023-06-12 LAB — PROCALCITONIN: Procalcitonin: 1.32 ng/mL

## 2023-06-12 MED ORDER — CHLORPROMAZINE HCL 50 MG PO TABS
75.0000 mg | ORAL_TABLET | Freq: Every day | ORAL | Status: DC
  Filled 2023-06-12: qty 1

## 2023-06-12 MED ORDER — INSULIN ASPART 100 UNIT/ML IJ SOLN
0.0000 [IU] | Freq: Three times a day (TID) | INTRAMUSCULAR | Status: DC
  Administered 2023-06-12 – 2023-06-14 (×3): 3 [IU] via SUBCUTANEOUS
  Filled 2023-06-12 (×4): qty 1

## 2023-06-12 MED ORDER — LAMOTRIGINE 100 MG PO TABS: 200.0000 mg | ORAL_TABLET | Freq: Every day | ORAL | Status: DC

## 2023-06-12 MED ORDER — TOPIRAMATE 25 MG PO TABS: 25.0000 mg | ORAL_TABLET | Freq: Every day | ORAL | Status: DC

## 2023-06-12 MED ORDER — SODIUM CHLORIDE 0.9 % IV SOLN
500.0000 mg | INTRAVENOUS | Status: DC
  Administered 2023-06-13: 500 mg via INTRAVENOUS
  Filled 2023-06-12 (×2): qty 5

## 2023-06-12 MED ORDER — METOPROLOL SUCCINATE ER 50 MG PO TB24
50.0000 mg | ORAL_TABLET | Freq: Every day | ORAL | Status: DC
  Administered 2023-06-13: 50 mg via ORAL
  Filled 2023-06-12 (×2): qty 1

## 2023-06-12 MED ORDER — ICOSAPENT ETHYL 1 G PO CAPS
2.0000 g | ORAL_CAPSULE | Freq: Two times a day (BID) | ORAL | Status: DC
  Administered 2023-06-13 (×2): 2 g via ORAL
  Filled 2023-06-12 (×3): qty 2

## 2023-06-12 MED ORDER — LAMOTRIGINE 25 MG PO TABS: 25.0000 mg | ORAL_TABLET | Freq: Every day | ORAL | Status: DC

## 2023-06-12 MED ORDER — LAMOTRIGINE 25 MG PO TABS: 225.0000 mg | ORAL_TABLET | Freq: Every day | ORAL | Status: DC

## 2023-06-12 MED ORDER — CLONAZEPAM 0.25 MG PO TBDP
0.2500 mg | ORAL_TABLET | Freq: Two times a day (BID) | ORAL | Status: DC | PRN
  Administered 2023-06-13 – 2023-06-14 (×2): 0.25 mg via ORAL
  Filled 2023-06-12 (×2): qty 1

## 2023-06-12 MED ORDER — INSULIN GLARGINE-YFGN 100 UNIT/ML ~~LOC~~ SOLN
10.0000 [IU] | Freq: Every day | SUBCUTANEOUS | Status: DC
  Administered 2023-06-12 – 2023-06-13 (×2): 10 [IU] via SUBCUTANEOUS
  Filled 2023-06-12 (×3): qty 0.1

## 2023-06-12 MED ORDER — LACTATED RINGERS IV BOLUS
1000.0000 mL | Freq: Once | INTRAVENOUS | Status: AC
  Administered 2023-06-12: 1000 mL via INTRAVENOUS

## 2023-06-12 MED ORDER — PANTOPRAZOLE SODIUM 40 MG PO TBEC
40.0000 mg | DELAYED_RELEASE_TABLET | Freq: Every day | ORAL | Status: DC
  Administered 2023-06-13 – 2023-06-14 (×2): 40 mg via ORAL
  Filled 2023-06-12 (×2): qty 1

## 2023-06-12 MED ORDER — IPRATROPIUM-ALBUTEROL 0.5-2.5 (3) MG/3ML IN SOLN
3.0000 mL | Freq: Four times a day (QID) | RESPIRATORY_TRACT | Status: DC
  Administered 2023-06-12 – 2023-06-14 (×7): 3 mL via RESPIRATORY_TRACT
  Filled 2023-06-12 (×7): qty 3

## 2023-06-12 MED ORDER — ESCITALOPRAM OXALATE 10 MG PO TABS
20.0000 mg | ORAL_TABLET | Freq: Every day | ORAL | Status: DC
  Administered 2023-06-13 – 2023-06-14 (×2): 20 mg via ORAL
  Filled 2023-06-12 (×2): qty 2

## 2023-06-12 MED ORDER — CLOPIDOGREL BISULFATE 75 MG PO TABS
75.0000 mg | ORAL_TABLET | Freq: Every day | ORAL | Status: DC
  Administered 2023-06-13 – 2023-06-14 (×2): 75 mg via ORAL
  Filled 2023-06-12 (×2): qty 1

## 2023-06-12 MED ORDER — ONDANSETRON HCL 4 MG/2ML IJ SOLN: 4.0000 mg | Freq: Four times a day (QID) | INTRAMUSCULAR | Status: DC | PRN

## 2023-06-12 MED ORDER — LACTATED RINGERS IV SOLN: INTRAVENOUS | Status: DC

## 2023-06-12 MED ORDER — SODIUM CHLORIDE 0.9 % IV SOLN
500.0000 mg | Freq: Once | INTRAVENOUS | Status: AC
  Administered 2023-06-12: 500 mg via INTRAVENOUS
  Filled 2023-06-12: qty 5

## 2023-06-12 MED ORDER — SODIUM CHLORIDE 0.9 % IV SOLN
2.0000 g | Freq: Once | INTRAVENOUS | Status: AC
  Administered 2023-06-12: 2 g via INTRAVENOUS
  Filled 2023-06-12: qty 12.5

## 2023-06-12 MED ORDER — ASPIRIN 81 MG PO CHEW
81.0000 mg | CHEWABLE_TABLET | Freq: Every day | ORAL | Status: DC
  Administered 2023-06-13: 81 mg via ORAL
  Filled 2023-06-12: qty 1

## 2023-06-12 MED ORDER — ACETAMINOPHEN 325 MG PO TABS: 650.0000 mg | ORAL_TABLET | Freq: Four times a day (QID) | ORAL | Status: DC | PRN

## 2023-06-12 MED ORDER — SODIUM CHLORIDE 0.9 % IV SOLN
2.0000 g | INTRAVENOUS | Status: DC
  Administered 2023-06-12 – 2023-06-13 (×2): 2 g via INTRAVENOUS
  Filled 2023-06-12 (×2): qty 20

## 2023-06-12 NOTE — Assessment & Plan Note (Signed)
Blood pressure was on the lower end of normal on arrival.  Has improved with IV fluids.  -Restart home metoprolol tomorrow

## 2023-06-12 NOTE — ED Provider Notes (Signed)
[facility] Provider Note    Event Date/Time   First MD Initiated Contact with Patient 06/12/23 1223     (approximate)   History   Shortness of Breath   HPI  Erika [name] is a 52 y.o. female past medical history significant for chronic hypoxia on 5 L at baseline, vascular dementia, prior CVA with residual left-sided weakness, diabetes, who presents to the emergency department with shortness of breath and hypoxia.  Worsening shortness of breath over the past couple of weeks.  CT scan was ordered by pulmonologist and read as multifocal pneumonia according to the husband.  States that he was never notified by pulmonology and she was never started on any antibiotics.  Worsening symptoms today which brought her into the emergency department.  Decreased p.o. intake over the past 24 hours.  Patient's husband is POA.  DNR/DNI     Physical Exam   Triage Vital Signs: ED Triage Vitals  Enc Vitals Group     BP 06/12/23 1221 92/73     Pulse Rate 06/12/23 1220 (!) 118     Resp 06/12/23 1220 (!) 24     Temp 06/12/23 1220 97.8 F (36.6 C)     Temp Source 06/12/23 1220 Axillary     SpO2 06/12/23 1217 (!) 78 %     Weight --      Height --      Head Circumference --      Peak Flow --      Pain Score 06/12/23 1220 0     Pain Loc --      Pain Edu? --      Excl. in GC? --     Most recent vital signs: Vitals:   06/12/23 1318 06/12/23 1400  BP: 93/68 101/86  Pulse:  97  Resp: 12 15  Temp:    SpO2:  95%    Physical Exam Constitutional:      Appearance: She is well-developed. She is obese.  HENT:     Head: Atraumatic.  Eyes:     Conjunctiva/sclera: Conjunctivae normal.  Cardiovascular:     Rate and Rhythm: Regular rhythm. Tachycardia present.  Pulmonary:     Effort: Tachypnea present. No respiratory distress.     Breath sounds: Rhonchi and rales present.  Abdominal:     General: There is no distension.  Musculoskeletal:     Cervical back:  Normal range of motion.     Right lower leg: No edema.     Left lower leg: No edema.  Skin:    General: Skin is warm.  Neurological:     Mental Status: She is alert. Mental status is at baseline.     IMPRESSION / MDM / ASSESSMENT AND PLAN / ED COURSE  I reviewed the triage vital signs and the nursing notes.  On chart review patient has been followed by pulmonology.  I do see a CT scan without contrast of the chest -it was ordered on 05/29/2023, it does not appear to have been read until 06/07/2023.  Read as a multifocal pneumonia.  Coronary artery calcifications  Differential diagnosis including sepsis, pneumonia, PE  EKG  I, Corena Herter, the attending physician, personally viewed and interpreted this ECG.   Rate: 110s  Rhythm: Sinus tachycardia  Axis: Normal  Intervals: Normal  ST&T Change: T waves inverted to the inferior and lateral leads  Sinus tachycardia while on cardiac telemetry.  RADIOLOGY I independently reviewed imaging, my interpretation of imaging: Chest x-ray  with right lower lobe pneumonia.  Read as concern for right lower lobe pneumonia.  LABS (all labs ordered are listed, but only abnormal results are displayed) Labs interpreted as -    Labs Reviewed  LACTIC ACID, PLASMA - Abnormal; Notable for the following components:      Result Value   Lactic Acid, Venous 2.3 (*)    All other components within normal limits  COMPREHENSIVE METABOLIC PANEL - Abnormal; Notable for the following components:   Glucose, Bld 179 (*)    Creatinine, Ser 1.26 (*)    Total Protein 8.2 (*)    GFR, Estimated 52 (*)    All other components within normal limits  CBC WITH DIFFERENTIAL/PLATELET - Abnormal; Notable for the following components:   WBC 22.1 (*)    RBC 5.93 (*)    MCV 73.0 (*)    MCH 20.9 (*)    MCHC 28.6 (*)    RDW 17.9 (*)    Platelets 567 (*)    Neutro Abs 20.0 (*)    Abs Immature Granulocytes 0.09 (*)    All other components within normal limits  BLOOD  GAS, VENOUS - Abnormal; Notable for the following components:   Bicarbonate 31.6 (*)    Acid-Base Excess 4.0 (*)    All other components within normal limits  CULTURE, BLOOD (ROUTINE X 2)  CULTURE, BLOOD (ROUTINE X 2)  BRAIN NATRIURETIC PEPTIDE  LACTIC ACID, PLASMA  URINALYSIS, W/ REFLEX TO CULTURE (INFECTION SUSPECTED)  TROPONIN I (HIGH SENSITIVITY)  TROPONIN I (HIGH SENSITIVITY)     MDM    On initial arrival concerning for sepsis.  Significant hypoxia with EMS.  Hypoxic to 70s on her 5 L nasal cannula.  Placed on BiPAP.  BiPAP currently at 14/8 on 50% FiO2  Blood cultures obtained.  Leukocytosis of 22.  Appears clinically dehydrated.  Felt that 30 cc/kg of IV fluids may be detrimental given her shortness of breath and unknown heart failure history, given 1 L of IV fluids and will reevaluate.  Started on cefepime and azithromycin to cover for community-acquired pneumonia with Pseudomonas risk factors.  Lactic acid elevated at 2.3.  Hyperglycemia but does not meet criteria for DKA.  Mild acute kidney injury.  Normal BNP and troponin.  Consulted hospitalist for admission for acute hypoxic respiratory failure in the setting of pneumonia.   PROCEDURES:  Critical Care performed: yes  .Critical Care  Performed by: Corena Herter, MD Authorized by: Corena Herter, MD   Critical care provider statement:    Critical care time (minutes):  45   Critical care time was exclusive of:  Separately billable procedures and treating other patients   Critical care was necessary to treat or prevent imminent or life-threatening deterioration of the following conditions:  Respiratory failure   Critical care was time spent personally by me on the following activities:  Development of treatment plan with patient or surrogate, discussions with consultants, evaluation of patient's response to treatment, examination of patient, ordering and review of laboratory studies, ordering and review of radiographic  studies, ordering and performing treatments and interventions, pulse oximetry, re-evaluation of patient's condition and review of old charts   Patient's presentation is most consistent with acute presentation with potential threat to life or bodily function.   MEDICATIONS ORDERED IN ED: Medications  lactated ringers infusion ( Intravenous New Bag/Given 06/12/23 1404)  ceFEPIme (MAXIPIME) 2 g in sodium chloride 0.9 % 100 mL IVPB (2 g Intravenous New Bag/Given 06/12/23 1404)  azithromycin (ZITHROMAX) 500  mg in sodium chloride 0.9 % 250 mL IVPB (has no administration in time range)  lactated ringers bolus 1,000 mL (has no administration in time range)    FINAL CLINICAL IMPRESSION(S) / ED DIAGNOSES   Final diagnoses:  Hypoxia  Community acquired pneumonia of right lower lobe of lung     Rx / DC Orders   ED Discharge Orders     None        Note:  This document was prepared using Dragon voice recognition software and may include unintentional dictation errors.   Corena Herter, MD 06/12/23 1431

## 2023-06-12 NOTE — Telephone Encounter (Signed)
Spoke with Susy Frizzle (pt's husband per DPR) who states pt was seen in ED [facility] yesterday r/t hitting head during fall. Pt was discharged from ED and sent home. When [name] woke up this morning pt was having a hard time catching her breath and O2 sats were in 60's with 5 L of O2 on. [name] gave pt inhalers and O2 came up to 70's. [name] then placed pt on BiPap and could not get O2 above 68%. [name] was instructed to call emergency transportation and have pt evaluated at ED. [name] said he would do that. RN stated she would notify Dr. Vassie Loll about what was going on. Nothing further needed at this time  Routing to Dr. Vassie Loll as Lorain Childes and Florentina Addison because you are of DOD.

## 2023-06-12 NOTE — Progress Notes (Signed)
Pt double triggering BiPAP machine. Pressures adjusted for pt comfort. Titration of FiO2 down based on oxygen saturations.

## 2023-06-12 NOTE — Progress Notes (Signed)
[facility]link following for sepsis protocol. 

## 2023-06-12 NOTE — Progress Notes (Signed)
PHARMACY -  BRIEF ANTIBIOTIC NOTE   Pharmacy has received consult(s) for cefepime from an ED provider.  The patient's profile has been reviewed for ht/wt/allergies/indication/available labs.    One time order(s) placed for cefepime 2 grams x 1  Further antibiotics/pharmacy consults should be ordered by admitting physician if indicated.                       Thank you,   Elliot Gurney, PharmD, BCPS Clinical Pharmacist  06/12/2023 2:00 PM

## 2023-06-12 NOTE — ED Notes (Signed)
Attempted to place BiPAP back on pt, but pt reported air leaks around mask, and did not want to keep mask on.  Pt placed on 4L via [place].

## 2023-06-12 NOTE — H&P (Addendum)
History and Physical    Patient: Erika [name] UVO:[id number] DOB: Dec 21, 1971 DOA: 06/12/2023 DOS: the patient was seen and examined on 06/12/2023 PCP: Eden Emms, NP  Patient coming from: Home  Chief Complaint:  Chief Complaint  Patient presents with   Shortness of Breath   HPI: Erika [name] is a 52 y.o. female with medical history significant of chronic hypoxic respiratory failure on 5 L, CAD s/p CABG (2007), CVA with residual left-sided deficits, hypertension, PAD s/p stenting, type 2 diabetes, hyperlipidemia, HFpEF, complex sleep apnea syndrome, who presents to the ED due to shortness of breath.  History obtained from both patient and her husband at bedside given patient is on BiPAP at this time, making answering questions difficult.  Per patient's husband, for the last 1 week, she has been experiencing increased shortness of breath compared to baseline in addition to a productive cough.  She endorses nausea but denies any vomiting, fever, chills, diarrhea or chest pain.  She had a fall yesterday when she fell forward and ultimately hit the back of her head.  She denies any loss of consciousness after the fall.  She denies any dizziness, palpitations or chest pain prior to the fall.  Family at bedside states she lost her footing.  Patient's husband at bedside notes that patient has a history of aspiration in the past.  ED course: On arrival to the ED, patient was normotensive at 96/69 with heart rate of 103.  She was saturating at 96% on BiPAP.  She was afebrile at 97.8.  Initial workup demonstrated WBC of 22.1 with hemoglobin of 12.4, platelets of 567, creatinine 1.26 with GFR 52.  BNP and troponin are negative.  Lactic acid elevated 2.3.Chest x-ray was obtained that demonstrated persistent ill-defined opacity in the right lung base.  Patient started on azithromycin and cefepime.  TRH contacted for admission.  Review of Systems: As mentioned in the history of present  illness. All other systems reviewed and are negative.  Past Medical History:  Diagnosis Date   Acute respiratory failure with hypoxia (HCC) 06/24/2021   Arthralgia of temporomandibular joint    CAD, multiple vessel    a. 06/2016 Cath: ostLM 40%, ostLAD 40%, pLAD 95%, ost-pLCx 60%, pLCx 95%, mLCx 60%, mRCA 95%, D2 50%, LVSF nl;  b. 07/2016 CABG x 4 (LIMA->LAD, VG->Diag, VG->OM, VG->RCA); c. 08/2016 Cath: 3VD w/ 4/4 patent grafts. LAD distal to LIMA has diff dzs->Med rx; d. 08/2020 Cath: 4/4 patent grafts, native 3VD. EF 55-65%-->Med Rx.   Carotid arterial disease (HCC)    a. 07/2016 s/p R CEA; b. 02/2021 U/S: RICA 40-59%, LICA 1-39%.   Cerebrovascular disease    Clotting disorder (HCC)    Complex sleep apnea syndrome 10/03/2022   Depression    Diastolic dysfunction    a. 06/2016 Echo: EF 50-55%, mild inf wall HK, GR1DD, mild MR, RV sys fxn nl, mildly dilated LA, PASP nl; b. 06/2021 Echo: EF 60-65%, no rwma. Nl RV fxn.   Fatty liver disease, nonalcoholic 2016   History of blood transfusion    with heart surgery   HLD (hyperlipidemia)    Labile hypertension    a. prior renal ngiogram negative for RAS in 03/2016; b. catecholamines and metanephrines normal, mildly elevated renin with normal aldosterone and normal ratio in 02/2016   Myocardial infarction [facility]) 2017   Obesity    PAD (peripheral artery disease) (HCC)    a. 09/2018 s/p L SFA stenting; b. 07/2019 Periph Angio: Patent m/d L SFA  stent w/ 100% L SFA distal to stent. L AT 100d, L Peroneal diff dzs-->Med Rx; c. 02/2021 ABIs: stable @ 0.61 on R and 0.46 on L.   PTSD (post-traumatic stress disorder)    Stroke (HCC)    Tobacco abuse    Type 2 diabetes mellitus (HCC) 12/2015   Past Surgical History:  Procedure Laterality Date   ABDOMINAL AORTOGRAM W/LOWER EXTREMITY [name] 10/15/2018   Procedure: ABDOMINAL AORTOGRAM W/LOWER EXTREMITY;  Surgeon: Iran Ouch, MD;  Location: [facility];  Service: Cardiovascular;  Laterality: [name];    ABDOMINAL AORTOGRAM W/LOWER EXTREMITY Bilateral 08/19/2019   Procedure: ABDOMINAL AORTOGRAM W/LOWER EXTREMITY;  Surgeon: Iran Ouch, MD;  Location: [facility];  Service: Cardiovascular;  Laterality: Bilateral;   CARDIAC CATHETERIZATION [name] 06/29/2016   Procedure: Left Heart Cath and Coronary Angiography;  Surgeon: Antonieta Iba, MD;  Location: [facility];  Service: Cardiovascular;  Laterality: [name];   CARDIAC CATHETERIZATION [name] 08/29/2016   Procedure: Left Heart Cath and Cors/Grafts Angiography;  Surgeon: Iran Ouch, MD;  Location: [facility];  Service: Cardiovascular;  Laterality: [name];   CESAREAN SECTION     CHOLECYSTECTOMY     CORONARY ARTERY BYPASS GRAFT [name] 07/06/2016   Procedure: CORONARY ARTERY BYPASS GRAFTING (CABG) x four, using left internal mammary artery and right leg greater saphenous vein harvested endoscopically;  Surgeon: Kerin Perna, MD;  Location: [facility] OR;  Service: Open Heart Surgery;  Laterality: [name];   ENDARTERECTOMY Right 07/06/2016   Procedure: ENDARTERECTOMY CAROTID;  Surgeon: Larina Earthly, MD;  Location: [facility] OR;  Service: Vascular;  Laterality: Right;   ENDARTERECTOMY Right 04/27/2020   Procedure: REDO OF RIGHT ENDARTERECTOMY CAROTID;  Surgeon: Larina Earthly, MD;  Location: [facility] OR;  Service: Vascular;  Laterality: Right;   LEFT HEART CATH AND CORS/GRAFTS ANGIOGRAPHY [name] 08/24/2020   Procedure: LEFT HEART CATH AND CORS/GRAFTS ANGIOGRAPHY;  Surgeon: Iran Ouch, MD;  Location: [facility];  Service: Cardiovascular;  Laterality: [name];   LEFT HEART CATH AND CORS/GRAFTS ANGIOGRAPHY [name] 01/29/2022   Procedure: LEFT HEART CATH AND CORS/GRAFTS ANGIOGRAPHY;  Surgeon: Iran Ouch, MD;  Location: [facility];  Service: Cardiovascular;  Laterality: [name];   PERIPHERAL VASCULAR CATHETERIZATION [name] 04/18/2016   Procedure: Renal Angiography;  Surgeon: Iran Ouch, MD;  Location: [facility];  Service: Cardiovascular;   Laterality: [name];   PERIPHERAL VASCULAR INTERVENTION Left 10/15/2018   Procedure: PERIPHERAL VASCULAR INTERVENTION;  Surgeon: Iran Ouch, MD;  Location: [facility];  Service: Cardiovascular;  Laterality: Left;  Left superficial femoral   TEE WITHOUT CARDIOVERSION [name] 07/06/2016   Procedure: TRANSESOPHAGEAL ECHOCARDIOGRAM (TEE);  Surgeon: Kerin Perna, MD;  Location: [facility] OR;  Service: Open Heart Surgery;  Laterality: [name];   TONSILLECTOMY     Social History:  reports that she quit smoking about 2 years ago. Her smoking use included cigarettes. She has a 15.00 pack-year smoking history. She has never been exposed to tobacco smoke. She has never used smokeless tobacco. She reports that she does not currently use alcohol. She reports that she does not use drugs.  Allergies  Allergen Reactions   Chantix [Varenicline Tartrate] Other (See Comments)    Feels "crazy" and angry     Family History  Adopted: Yes  Problem Relation Age of Onset   Diabetes Mother    Diabetes Father    Alcohol abuse Father    Heart disease Father  Drug abuse Father    Stroke Sister    Anxiety disorder Sister     Prior to Admission medications   Medication Sig Start Date End Date Taking? Authorizing Provider  acetaminophen (TYLENOL) 325 MG tablet Take 1-2 tablets (325-650 mg total) by mouth every 4 (four) hours as needed for mild pain. 08/01/21   Love, Evlyn Kanner, PA-C  albuterol (VENTOLIN HFA) 108 (90 Base) MCG/ACT inhaler Inhale 2 puffs into the lungs every 6 (six) hours as needed for wheezing. 04/08/23   Eden Emms, NP  aspirin 81 MG chewable tablet Chew 1 tablet (81 mg total) by mouth daily with breakfast. 12/24/21   Shon Hale, MD  chlorproMAZINE (THORAZINE) 25 MG tablet Take 3 tablets (75 mg total) by mouth at bedtime. 06/07/23   Arfeen, Phillips Grout, MD  clonazePAM (KLONOPIN) 0.5 MG tablet Take 1/2 tab twice daily. No dissolving tablets. 05/13/23   Arfeen, Phillips Grout, MD  clopidogrel (PLAVIX) 75 MG  tablet Take 1 tablet (75 mg total) by mouth daily. 03/06/23   Sater, Pearletha Furl, MD  Continuous Glucose Sensor (DEXCOM G7 SENSOR) MISC APPLY ONE SENSOR TO THE BACK OF YOUR UPPER ARM. REPLACE EVERY 10 DAYS. 04/12/23   Eden Emms, NP  cyclobenzaprine (FLEXERIL) 10 MG tablet TAKE ONE TABLET BY MOUTH AT BEDTIME 02/04/23   Jones Bales, NP  Erenumab-aooe (AIMOVIG) 70 MG/ML SOAJ Inject 1 Application into the skin every 30 (thirty) days. 06/05/23   Jones Bales, NP  escitalopram (LEXAPRO) 20 MG tablet Take 1 tablet (20 mg total) by mouth daily. 05/13/23 07/12/23  Arfeen, Phillips Grout, MD  Evolocumab (REPATHA SURECLICK) 140 MG/ML SOAJ Inject 140 mg into the skin as directed. Inject 1 Dose into the skin every 14 (fourteen) days. 10/31/22   Furth, Cadence H, PA-C  icosapent Ethyl (VASCEPA) 1 g capsule Take 2 g by mouth 2 (two) times daily.    [provider]  insulin detemir (LEVEMIR) 100 UNIT/ML injection Inject 0.27 mLs (27 Units total) into the skin daily. 04/08/23   Eden Emms, NP  lamoTRIgine (LAMICTAL) 200 MG tablet Take 1 tablet (200 mg total) by mouth at bedtime. 05/13/23   Arfeen, Phillips Grout, MD  lamoTRIgine (LAMICTAL) 25 MG tablet Take 1 tablet (25 mg total) by mouth daily. 05/13/23 07/12/23  Arfeen, Phillips Grout, MD  metoprolol succinate (TOPROL-XL) 50 MG 24 hr tablet Take 1 tablet (50 mg total) by mouth daily. Take with or immediately following a meal. 10/16/22 10/11/23  Furth, Cadence H, PA-C  nitroGLYCERIN (NITROSTAT) 0.4 MG SL tablet Place 1 tablet (0.4 mg total) under the tongue every 5 (five) minutes as needed for chest pain. 03/15/21   Iran Ouch, MD  Oxycodone HCl 10 MG TABS Take 1 tablet (10 mg total) by mouth 2 (two) times daily as needed. 06/10/23   Jones Bales, NP  pantoprazole (PROTONIX) 40 MG tablet TAKE ONE TABLET BY MOUTH ONE TIME DAILY 06/12/23   Raulkar, Drema Pry, MD  pregabalin (LYRICA) 225 MG capsule Take 1 capsule (225 mg total) by mouth 2 (two) times daily. 04/11/23    Jones Bales, NP  Rimegepant Sulfate (NURTEC) 75 MG TBDP Take 1 tablet (75 mg total) by mouth every other day. 05/07/23   Raulkar, Drema Pry, MD  rosuvastatin (CRESTOR) 20 MG tablet Take 1 tablet (20 mg total) by mouth daily. 10/04/22   Iran Ouch, MD  Tiotropium Bromide Monohydrate (SPIRIVA RESPIMAT) 2.5 MCG/ACT AERS Inhale 2 puffs into the lungs  daily. 05/24/23   Glenford Bayley, NP  tirzepatide [facility]) 7.5 MG/0.5ML Pen Inject 7.5 mg into the skin once a week. 03/05/23   Eden Emms, NP  topiramate (TOPAMAX) 25 MG tablet Take 1 tablet (25 mg total) by mouth at bedtime. 05/16/23   Raulkar, Drema Pry, MD  ULTICARE MINI PEN NEEDLES 31G X 6 MM MISC USE PEN NEEDLES TWO TIMES DAILY 12/26/22   Eden Emms, NP    Physical Exam: Vitals:   06/12/23 1400 06/12/23 1415 06/12/23 1430 06/12/23 1445  BP: 101/86 113/81 122/78 117/78  Pulse: 97 97 97 90  Resp: 15 15 15 13   Temp:      TempSrc:      SpO2: 95% 97% 98% 98%   Physical Exam Vitals and nursing note reviewed.  Constitutional:      Appearance: She is obese. She is ill-appearing. She is not toxic-appearing.  HENT:     Head: Normocephalic and atraumatic.     Mouth/Throat:     Comments: Dry oropharynx noted Eyes:     Extraocular Movements: Extraocular movements intact.     Pupils: Pupils are equal, round, and reactive to light.  Neck:     Vascular: No JVD.  Cardiovascular:     Rate and Rhythm: Normal rate and regular rhythm.     Heart sounds: No murmur heard. Pulmonary:     Effort: Tachypnea present. No accessory muscle usage.     Breath sounds: Decreased breath sounds (Diminished breath sounds in the right basilar region) and rhonchi (Subtle rhonchi throughout) present. No wheezing or rales.  Abdominal:     General: Bowel sounds are normal.     Palpations: Abdomen is soft.     Tenderness: There is no abdominal tenderness.  Musculoskeletal:     Right lower leg: No edema.     Left lower leg: No edema.  Skin:     General: Skin is warm and dry.  Neurological:     Mental Status: She is alert.     Comments:  Patient is sleepy but awakens easily and able to answer questions appropriately.  Residual left arm and leg weakness noted.     Data Reviewed: CBC with WBC of 22.1, hemoglobin 12.4, MCV of 73, platelets of 567 CMP with sodium of 136, potassium 3.9, bicarb 27, glucose 179, BUN 16, creatinine 1.26, AST 19, ALT 12 and GFR 52 BNP within normal limits at 17.3 Troponin negative at 8 Lactic acid 2.3  EKG personally reviewed.  Sinus rhythm with rate of 113.  Nonspecific T wave inversion but no other evidence of acute ischemic changes.  DG Chest Port 1 View  Result Date: 06/12/2023 CLINICAL DATA:  Provided history: Questionable sepsis-evaluate for abnormality. EXAM: PORTABLE CHEST 1 VIEW COMPARISON:  Chest CT 05/29/2023. Prior chest radiographs 03/05/2023 and earlier. FINDINGS: Prior median sternotomy/CABG. As before, there are fractures within upper sternal cerclage wires. Mild cardiomegaly. Persistent ill-defined opacity within the right lung base. No appreciable airspace consolidation on the left. No evidence of pleural effusion or pneumothorax. No acute osseous abnormality identified. IMPRESSION: Persistent ill-defined opacity within the right lung base compatible with pneumonia. Radiographic follow-up to complete resolution recommended to exclude underlying malignancy. Electronically Signed   By: Jackey Loge D.O.   On: 06/12/2023 13:41    Results are pending, will review when available.  Assessment and Plan:  * Sepsis (HCC) In the setting of community-acquired pneumonia, potentially due to aspiration.  Interestingly, there was evidence of multifocal pneumonia seen on  CT imaging that was obtained on 05/29/2023.  At that time, her pulmonologist was concern for aspiration pneumonia due to silent aspiration. Given marked increase in WBC, will obtain a CT of the chest to rule out any complications.   -  Blood cultures pending - Continue azithromycin - Transition to ceftriaxone - CT of the chest pending - Procalcitonin - Strep pneumo and Legionella urinary antigens  Acute hypoxic respiratory failure (HCC) Patient requires 5 L at bedtime chronically but is currently on BiPAP to maintain oxygen saturation above 90% in the setting of pneumonia.  - Continue BiPAP - Wean as tolerated to home supplemental oxygen  AKI (acute kidney injury) (HCC) I suspect this is due to underlying illness with hypovolemia noted on examination.  - S/p 1 L bolus - Continue maintenance fluids overnight - Avoid nephrotoxic agents - Bladder scan - Repeat BMP in a.m.  Chronic diastolic CHF (congestive heart failure) (HCC) Patient appears hypovolemic on examination at this time. No peripheral or pitting edema.  Will monitor closely while on IV fluids.  CAD (coronary artery disease) - Continue home aspirin, Plavix, Vascepa and statin  Essential hypertension Blood pressure was on the lower end of normal on arrival.  Has improved with IV fluids.  -Restart home metoprolol tomorrow  Type 2 diabetes mellitus with other specified complication (HCC) - Hold home Mounjaro and Levemir  - SSI, moderate - Semglee 10 units at bedtime - A1c pending  Advance Care Planning:   Code Status: DNR/DNI, as confirmed by patient and her husband at bedside  Consults: None  Family Communication: Patient's husband updated at bedside  Severity of Illness: The appropriate patient status for this patient is INPATIENT. Inpatient status is judged to be reasonable and necessary in order to provide the required intensity of service to ensure the patient's safety. The patient's presenting symptoms, physical exam findings, and initial radiographic and laboratory data in the context of their chronic comorbidities is felt to place them at high risk for further clinical deterioration. Furthermore, it is not anticipated that the patient will  be medically stable for discharge from the hospital within 2 midnights of admission.   * I certify that at the point of admission it is my clinical judgment that the patient will require inpatient hospital care spanning beyond 2 midnights from the point of admission due to high intensity of service, high risk for further deterioration and high frequency of surveillance required.*  Author: Verdene Lennert, MD 06/12/2023 4:17 PM  For on call review [email or web address].

## 2023-06-12 NOTE — Progress Notes (Signed)
CODE SEPSIS - PHARMACY COMMUNICATION  **Broad Spectrum Antibiotics should be administered within 1 hour of Sepsis diagnosis**  Time Code Sepsis Called/Page Received: 1404  Antibiotics Ordered: cefepime 2 grams x 1 and azithromycin 500 mg x 1  Time of 1st antibiotic administration: 1404  Additional action taken by pharmacy: none  If necessary, Name of Provider/Nurse Contacted: n/a    Elliot Gurney, PharmD, BCPS Clinical Pharmacist  06/12/2023 2:01 PM

## 2023-06-12 NOTE — Assessment & Plan Note (Signed)
-   Continue home aspirin, Plavix, Vascepa and statin

## 2023-06-12 NOTE — Progress Notes (Signed)
Pt transported to and from CT with RN without incident 

## 2023-06-12 NOTE — Assessment & Plan Note (Signed)
Patient requires 5 L at bedtime chronically but is currently on BiPAP to maintain oxygen saturation above 90% in the setting of pneumonia.  - Continue BiPAP - Wean as tolerated to home supplemental oxygen

## 2023-06-12 NOTE — ED Notes (Signed)
Bladder scan 299 mL. 

## 2023-06-12 NOTE — Assessment & Plan Note (Signed)
Patient appears hypovolemic on examination at this time. No peripheral or pitting edema.  Will monitor closely while on IV fluids.

## 2023-06-12 NOTE — Telephone Encounter (Signed)
Pt. Husband calling needs med advice wife's O2 stats are not good and need to know if he needs to take to hosp

## 2023-06-12 NOTE — ED Triage Notes (Signed)
Patient states increased shortness of breath, initial sat 61% on 5L; 87% on NRB. Recently diagnosed with Pneumonia but not started on antibiotics.

## 2023-06-12 NOTE — Telephone Encounter (Signed)
She needs to go to the emergency room via EMS. She could have a blood clot or other emergent problem that needs to be quickly addressed.

## 2023-06-12 NOTE — Assessment & Plan Note (Signed)
I suspect this is due to underlying illness with hypovolemia noted on examination.  - S/p 1 L bolus - Continue maintenance fluids overnight - Avoid nephrotoxic agents - Bladder scan - Repeat BMP in a.m.

## 2023-06-12 NOTE — Assessment & Plan Note (Addendum)
In the setting of community-acquired pneumonia, potentially due to aspiration.  Interestingly, there was evidence of multifocal pneumonia seen on CT imaging that was obtained on 05/29/2023.  At that time, her pulmonologist was concern for aspiration pneumonia due to silent aspiration. Given marked increase in WBC, will obtain a CT of the chest to rule out any complications.   - Blood cultures pending - Continue azithromycin - Transition to ceftriaxone - CT of the chest pending - Procalcitonin - Strep pneumo and Legionella urinary antigens

## 2023-06-12 NOTE — Assessment & Plan Note (Signed)
-   Hold home Mounjaro and Levemir  - SSI, moderate - Semglee 10 units at bedtime - A1c pending

## 2023-06-13 DIAGNOSIS — R0902 Hypoxemia: Principal | ICD-10-CM

## 2023-06-13 DIAGNOSIS — J189 Pneumonia, unspecified organism: Secondary | ICD-10-CM

## 2023-06-13 LAB — CULTURE, BLOOD (ROUTINE X 2): Special Requests: ADEQUATE

## 2023-06-13 LAB — COMPREHENSIVE METABOLIC PANEL
ALT: 11 U/L (ref 0–44)
AST: 11 U/L — ABNORMAL LOW (ref 15–41)
Albumin: 3.2 g/dL — ABNORMAL LOW (ref 3.5–5.0)
Alkaline Phosphatase: 57 U/L (ref 38–126)
Anion gap: 7 (ref 5–15)
BUN: 14 mg/dL (ref 6–20)
CO2: 27 mmol/L (ref 22–32)
Calcium: 9.4 mg/dL (ref 8.9–10.3)
Chloride: 103 mmol/L (ref 98–111)
Creatinine, Ser: 0.74 mg/dL (ref 0.44–1.00)
GFR, Estimated: >60 mL/min (ref >60)
Glucose, Bld: 105 mg/dL — ABNORMAL HIGH (ref 70–99)
Potassium: 3.8 mmol/L (ref 3.5–5.1)
Sodium: 137 mmol/L (ref 135–145)
Total Bilirubin: 0.5 mg/dL (ref 0.3–1.2)
Total Protein: 6.8 g/dL (ref 6.5–8.1)

## 2023-06-13 LAB — CBC WITH DIFFERENTIAL/PLATELET
Abs Immature Granulocytes: 0.12 10*3/uL — ABNORMAL HIGH (ref 0.00–0.07)
Basophils Absolute: 0 10*3/uL (ref 0.0–0.1)
Basophils Relative: 0 %
Eosinophils Absolute: 0 10*3/uL (ref 0.0–0.5)
Eosinophils Relative: 0 %
HCT: 28.2 % — ABNORMAL LOW (ref 36.0–46.0)
Hemoglobin: 8.4 g/dL — ABNORMAL LOW (ref 12.0–15.0)
Immature Granulocytes: 1 %
Lymphocytes Relative: 10 %
Lymphs Abs: 1.9 10*3/uL (ref 0.7–4.0)
MCH: 21.5 pg — ABNORMAL LOW (ref 26.0–34.0)
MCHC: 29.8 g/dL — ABNORMAL LOW (ref 30.0–36.0)
MCV: 72.3 fL — ABNORMAL LOW (ref 80.0–100.0)
Monocytes Absolute: 0.7 10*3/uL (ref 0.1–1.0)
Monocytes Relative: 3 %
Neutro Abs: 16.3 10*3/uL — ABNORMAL HIGH (ref 1.7–7.7)
Neutrophils Relative %: 86 %
Platelets: 428 10*3/uL — ABNORMAL HIGH (ref 150–400)
RBC: 3.9 MIL/uL (ref 3.87–5.11)
RDW: 17.2 % — ABNORMAL HIGH (ref 11.5–15.5)
WBC: 19.1 10*3/uL — ABNORMAL HIGH (ref 4.0–10.5)
nRBC: 0 % (ref 0.0–0.2)

## 2023-06-13 LAB — GLUCOSE, CAPILLARY
Glucose-Capillary: 98 mg/dL (ref 70–99)
Glucose-Capillary: 181 mg/dL — ABNORMAL HIGH (ref 70–99)
Glucose-Capillary: 101 mg/dL — ABNORMAL HIGH (ref 70–99)
Glucose-Capillary: 125 mg/dL — ABNORMAL HIGH (ref 70–99)

## 2023-06-13 LAB — HIV ANTIBODY (ROUTINE TESTING W REFLEX): HIV Screen 4th Generation wRfx: NONREACTIVE

## 2023-06-13 LAB — HEMOGLOBIN: Hemoglobin: 8.5 g/dL — ABNORMAL LOW (ref 12.0–15.0)

## 2023-06-13 MED ORDER — OXYCODONE HCL 5 MG PO TABS
10.0000 mg | ORAL_TABLET | Freq: Two times a day (BID) | ORAL | Status: DC | PRN
  Administered 2023-06-13 – 2023-06-14 (×3): 10 mg via ORAL
  Filled 2023-06-13 (×3): qty 2

## 2023-06-13 MED ORDER — ENOXAPARIN SODIUM 60 MG/0.6ML IJ SOSY
0.5000 mg/kg | PREFILLED_SYRINGE | INTRAMUSCULAR | Status: DC
  Administered 2023-06-13: 45 mg via SUBCUTANEOUS
  Filled 2023-06-13: qty 0.6

## 2023-06-13 MED ORDER — CHLORPROMAZINE HCL 25 MG PO TABS
75.0000 mg | ORAL_TABLET | Freq: Every day | ORAL | Status: DC
  Administered 2023-06-13: 75 mg via ORAL
  Filled 2023-06-13: qty 3

## 2023-06-13 MED ORDER — ASPIRIN 81 MG PO TBEC
81.0000 mg | DELAYED_RELEASE_TABLET | Freq: Every day | ORAL | Status: DC
  Administered 2023-06-14: 81 mg via ORAL
  Filled 2023-06-13: qty 1

## 2023-06-13 MED ORDER — PREGABALIN 75 MG PO CAPS
225.0000 mg | ORAL_CAPSULE | Freq: Two times a day (BID) | ORAL | Status: DC
  Administered 2023-06-13 (×2): 225 mg via ORAL
  Filled 2023-06-13 (×3): qty 3

## 2023-06-13 MED ORDER — TOPIRAMATE 25 MG PO TABS
25.0000 mg | ORAL_TABLET | Freq: Every day | ORAL | Status: DC
  Administered 2023-06-13: 25 mg via ORAL
  Filled 2023-06-13: qty 1

## 2023-06-13 MED ORDER — LAMOTRIGINE 100 MG PO TABS
200.0000 mg | ORAL_TABLET | Freq: Every day | ORAL | Status: DC
  Administered 2023-06-13: 200 mg via ORAL
  Filled 2023-06-13: qty 2

## 2023-06-13 MED ORDER — SENNA 8.6 MG PO TABS
1.0000 | ORAL_TABLET | Freq: Every day | ORAL | Status: DC
  Administered 2023-06-13 – 2023-06-14 (×2): 8.6 mg via ORAL
  Filled 2023-06-13 (×2): qty 1

## 2023-06-13 MED ORDER — LAMOTRIGINE 25 MG PO TABS
25.0000 mg | ORAL_TABLET | Freq: Every day | ORAL | Status: DC
  Administered 2023-06-13: 25 mg via ORAL
  Filled 2023-06-13: qty 1

## 2023-06-13 MED ORDER — POLYETHYLENE GLYCOL 3350 17 G PO PACK
17.0000 g | PACK | Freq: Two times a day (BID) | ORAL | Status: DC
  Administered 2023-06-13 (×2): 17 g via ORAL
  Filled 2023-06-13 (×3): qty 1

## 2023-06-13 NOTE — Plan of Care (Signed)
  Problem: Fluid Volume: Goal: Ability to maintain a balanced intake and output will improve Outcome: Progressing   Problem: Health Behavior/Discharge Planning: Goal: Ability to identify and utilize available resources and services will improve Outcome: Progressing Goal: Ability to manage health-related needs will improve Outcome: Progressing   Problem: Nutritional: Goal: Progress toward achieving an optimal weight will improve Outcome: Completed/Met   Problem: Skin Integrity: Goal: Risk for impaired skin integrity will decrease Outcome: Progressing   Problem: Tissue Perfusion: Goal: Adequacy of tissue perfusion will improve Outcome: Completed/Met   Problem: Tissue Perfusion: Goal: Adequacy of tissue perfusion will improve Outcome: Completed/Met   Problem: Education: Goal: Knowledge of General Education information will improve Description: Including pain rating scale, medication(s)/side effects and non-pharmacologic comfort measures Outcome: Completed/Met   Problem: Health Behavior/Discharge Planning: Goal: Ability to manage health-related needs will improve Outcome: Completed/Met

## 2023-06-13 NOTE — Progress Notes (Signed)
PROGRESS NOTE  Erika [name]    DOB: 02-01-1971, 52 y.o.  ZOX:[id number]    Code Status: DNR   DOA: 06/12/2023   LOS: 1   Brief hospital course  Erika [name] is a 52 y.o. female with a PMH significant for chronic hypoxic respiratory failure on 5 L, CAD s/p CABG (2007), CVA with residual left-sided deficits, hypertension, PAD s/p stenting, type 2 diabetes, hyperlipidemia, HFpEF, complex sleep apnea syndrome.  They presented from home to the ED on 06/12/2023 with SOB x 7 days. Has frequent history of aspiration.  In the ED, it was found that they had normotensive at 96/69 with heart rate of 103. She was saturating at 96% on BiPAP. She was afebrile at 97.8.  Significant findings included WBC of 22.1 with hemoglobin of 12.4, platelets of 567, creatinine 1.26 with GFR 52.  BNP and troponin are negative.  Lactic acid elevated 2.3.Chest x-ray was obtained that demonstrated persistent ill-defined opacity in the right lung base.  They were initially treated with azithromycin and cefepime.   Patient was admitted to medicine service for further workup and management of CAP as outlined in detail below.  06/13/23 -improving  Assessment & Plan  Principal Problem:   Sepsis (HCC) Active Problems:   Acute hypoxic respiratory failure (HCC)   AKI (acute kidney injury) (HCC)   Chronic diastolic CHF (congestive heart failure) (HCC)   CAD (coronary artery disease)   Essential hypertension   Type 2 diabetes mellitus with other specified complication (HCC)  Sepsis 2/2 aspiration PNA Acute hypoxic respiratory failure (HCC) Chest CT showed: Worsening lower lobe consolidation with some subtle areas in the middle lobe and upper lobe dependently. No significant pleural effusion. BxCx NGTD. Initially on bipap- improved to 4L. Procalcitonin 1.32 Chronically wears 5L Toco at bedtime  - Continue azithromycin - continue ceftriaxone  - Continue BiPAP QHS - Wean as tolerated to home supplemental  oxygen   AKI- Cr improved 1.26>0.74   Chronic diastolic CHF- euvolemic   CAD- - Continue home aspirin, Plavix, Vascepa and statin   HTN- stable - continue home metoprolol   Type 2 diabetes mellitus with other specified complication (HCC) - Hold home Mounjaro and Levemir  - SSI, moderate - Semglee 10 units at bedtime  Insomnia  Psych conditions - continue home meds  Body mass index is 32.31 kg/m.  VTE ppx: lovenox  Diet:     Diet   Diet NPO time specified   Consultants: None   Subjective 06/13/23    Pt reports feeling improved. Still having some shortness of breath.    Objective   Vitals:   06/12/23 2200 06/12/23 2331 06/13/23 0244 06/13/23 0741  BP: 100/62 122/70    Pulse: 82 100    Resp: 15 (!) 24    Temp:   97.7 F (36.5 C)   TempSrc:   Oral   SpO2: 98% 94% 95% 98%  Weight:   90.8 kg     Intake/Output Summary (Last 24 hours) at 06/13/2023 0815 Last data filed at 06/12/2023 2310 Gross per 24 hour  Intake 1495.86 ml  Output --  Net 1495.86 ml   Filed Weights   06/13/23 0244  Weight: 90.8 kg     Physical Exam:  General: awake, alert, NAD HEENT: atraumatic, clear conjunctiva, anicteric sclera, MMM, hearing grossly normal Respiratory: normal respiratory effort. Cardiovascular: quick capillary refill, normal S1/S2, RRR, no JVD, murmurs Nervous: A&O x3. no gross focal neurologic deficits, normal speech Extremities: moves all equally, no edema,  normal tone Skin: dry, intact, normal temperature, normal color. No rashes, lesions or ulcers on exposed skin Psychiatry: normal mood, congruent affect  Labs   I have personally reviewed the following labs and imaging studies CBC    Component Value Date/Time   WBC 19.1 (H) 06/13/2023 0556   RBC 3.90 06/13/2023 0556   HGB 8.4 (L) 06/13/2023 0556   HGB 14.2 09/11/2021 1413   HCT 28.2 (L) 06/13/2023 0556   HCT 43.5 09/11/2021 1413   PLT 428 (H) 06/13/2023 0556   PLT 433 09/11/2021 1413   MCV 72.3 (L)  06/13/2023 0556   MCV 89 09/11/2021 1413   MCV 89 01/11/2015 1022   MCH 21.5 (L) 06/13/2023 0556   MCHC 29.8 (L) 06/13/2023 0556   RDW 17.2 (H) 06/13/2023 0556   RDW 12.8 09/11/2021 1413   RDW 13.8 01/11/2015 1022   LYMPHSABS 1.9 06/13/2023 0556   LYMPHSABS 2.6 09/11/2021 1413   LYMPHSABS 3.4 01/11/2014 1312   MONOABS 0.7 06/13/2023 0556   MONOABS 0.7 01/11/2014 1312   EOSABS 0.0 06/13/2023 0556   EOSABS 0.2 09/11/2021 1413   EOSABS 0.3 01/11/2014 1312   BASOSABS 0.0 06/13/2023 0556   BASOSABS 0.0 09/11/2021 1413   BASOSABS 0.1 01/11/2014 1312      Latest Ref Rng & Units 06/13/2023    5:56 AM 06/12/2023   12:29 PM 11/30/2022    2:52 PM  BMP  Glucose 70 - 99 mg/dL 161  096  045   BUN 6 - 20 mg/dL 14  16  11    Creatinine 0.44 - 1.00 mg/dL 4.09  8.11  9.14   BUN/Creat Ratio 6 - 22 (calc)   SEE NOTE:   Sodium 135 - 145 mmol/L 137  136  134   Potassium 3.5 - 5.1 mmol/L 3.8  3.9  4.5   Chloride 98 - 111 mmol/L 103  99  97   CO2 22 - 32 mmol/L 27  27  28    Calcium 8.9 - 10.3 mg/dL 9.4  9.9  9.8     CT CHEST WO CONTRAST  Result Date: 06/12/2023 CLINICAL DATA:  Respiratory illness.  Shortness of breath EXAM: CT CHEST WITHOUT CONTRAST TECHNIQUE: Multidetector CT imaging of the chest was performed following the standard protocol without IV contrast. RADIATION DOSE REDUCTION: This exam was performed according to the departmental dose-optimization program which includes automated exposure control, adjustment of the mA and/or kV according to patient size and/or use of iterative reconstruction technique. COMPARISON:  X-ray earlier 06/12/2023.  CT 05/29/2023 FINDINGS: Cardiovascular: Status post median sternotomy. Coronary artery calcifications are seen. The thoracic aorta has a overall normal course and caliber with calcified plaque. Surgical changes along the ascending aorta. There is a bovine type aortic arch, normal variant. The left vertebral artery also originates directly from the aortic  arch proximal to the left subclavian artery origin, also a variant. No significant pericardial effusion. Mediastinum/Nodes: Slightly patulous thoracic esophagus. On this non IV contrast exam there is no specific abnormal lymph node enlargement identified in the axillary region, hilum or mediastinum. There are some small nodes identified in the mediastinum, similar to previous. Largest is again seen subcarinal were previous study had short axis dimension approaching 16 mm and today similar on series 2, image 70. Lungs/Pleura: Worsening consolidative opacities along both lower lobes. Few patchy areas of opacity as well with some reticular changes involving the dependent right upper lobe and middle lobe. No pneumothorax or significant effusion. Significant breathing motion Upper Abdomen: Adrenal  glands are preserved in the upper abdomen. Few prominent upper abdominal nodes are also seen, unchanged from previous. Again measuring for example 9 mm in short axis on series 2, image 127. Musculoskeletal: Scattered degenerative changes are seen along the spine. IMPRESSION: Worsening lower lobe consolidation with some subtle areas in the middle lobe and upper lobe dependently. No significant pleural effusion. Recommend continued follow-up. Stable borderline and mildly enlarged mediastinal and upper abdominal nodes. Postop chest. Aortic Atherosclerosis (ICD10-I70.0). Electronically Signed   By: Karen Kays M.D.   On: 06/12/2023 16:14   DG Chest Port 1 View  Result Date: 06/12/2023 CLINICAL DATA:  Provided history: Questionable sepsis-evaluate for abnormality. EXAM: PORTABLE CHEST 1 VIEW COMPARISON:  Chest CT 05/29/2023. Prior chest radiographs 03/05/2023 and earlier. FINDINGS: Prior median sternotomy/CABG. As before, there are fractures within upper sternal cerclage wires. Mild cardiomegaly. Persistent ill-defined opacity within the right lung base. No appreciable airspace consolidation on the left. No evidence of pleural  effusion or pneumothorax. No acute osseous abnormality identified. IMPRESSION: Persistent ill-defined opacity within the right lung base compatible with pneumonia. Radiographic follow-up to complete resolution recommended to exclude underlying malignancy. Electronically Signed   By: Jackey Loge D.O.   On: 06/12/2023 13:41   CT Head Wo Contrast  Result Date: 06/11/2023 CLINICAL DATA:  Blunt polytrauma.  Patient from home after a fall. EXAM: CT HEAD WITHOUT CONTRAST CT CERVICAL SPINE WITHOUT CONTRAST TECHNIQUE: Multidetector CT imaging of the head and cervical spine was performed following the standard protocol without intravenous contrast. Multiplanar CT image reconstructions of the cervical spine were also generated. RADIATION DOSE REDUCTION: This exam was performed according to the departmental dose-optimization program which includes automated exposure control, adjustment of the mA and/or kV according to patient size and/or use of iterative reconstruction technique. COMPARISON:  MRI examination dated January 26, 2022 FINDINGS: CT HEAD FINDINGS Brain: No evidence of acute infarction, hemorrhage, hydrocephalus, extra-axial collection or mass lesion/mass effect. Encephalomalacia of the right posterior parietal and occipital lobe suggesting chronic infarct. Vascular: No hyperdense vessel or unexpected calcification. Skull: Large left posterior parietal/occipital scalp hematoma without evidence of calvarial fracture. Sinuses/Orbits: No acute finding. Other: None. CT CERVICAL SPINE FINDINGS Alignment: Straightening of the cervical spine. Skull base and vertebrae: No acute fracture. No primary bone lesion or focal pathologic process. Soft tissues and spinal canal: No prevertebral fluid or swelling. No visible canal hematoma. Disc levels: Mild multilevel degenerate disc disease. No significant spinal canal or neural foraminal stenosis. Upper chest: Negative. Other: None IMPRESSION: CT HEAD: 1. No acute intracranial  abnormality. 2. Large left posterior parietal/occipital scalp hematoma without evidence of calvarial fracture. 3. Encephalomalacia of the right posterior parietal and occipital lobe suggesting chronic infarct. CT CERVICAL SPINE: 1. No acute fracture or traumatic subluxation. 2. Mild multilevel degenerate disc disease. Electronically Signed   By: Larose Hires D.O.   On: 06/11/2023 14:03   CT Cervical Spine Wo Contrast  Result Date: 06/11/2023 CLINICAL DATA:  Blunt polytrauma.  Patient from home after a fall. EXAM: CT HEAD WITHOUT CONTRAST CT CERVICAL SPINE WITHOUT CONTRAST TECHNIQUE: Multidetector CT imaging of the head and cervical spine was performed following the standard protocol without intravenous contrast. Multiplanar CT image reconstructions of the cervical spine were also generated. RADIATION DOSE REDUCTION: This exam was performed according to the departmental dose-optimization program which includes automated exposure control, adjustment of the mA and/or kV according to patient size and/or use of iterative reconstruction technique. COMPARISON:  MRI examination dated January 26, 2022 FINDINGS: CT HEAD  FINDINGS Brain: No evidence of acute infarction, hemorrhage, hydrocephalus, extra-axial collection or mass lesion/mass effect. Encephalomalacia of the right posterior parietal and occipital lobe suggesting chronic infarct. Vascular: No hyperdense vessel or unexpected calcification. Skull: Large left posterior parietal/occipital scalp hematoma without evidence of calvarial fracture. Sinuses/Orbits: No acute finding. Other: None. CT CERVICAL SPINE FINDINGS Alignment: Straightening of the cervical spine. Skull base and vertebrae: No acute fracture. No primary bone lesion or focal pathologic process. Soft tissues and spinal canal: No prevertebral fluid or swelling. No visible canal hematoma. Disc levels: Mild multilevel degenerate disc disease. No significant spinal canal or neural foraminal stenosis. Upper  chest: Negative. Other: None IMPRESSION: CT HEAD: 1. No acute intracranial abnormality. 2. Large left posterior parietal/occipital scalp hematoma without evidence of calvarial fracture. 3. Encephalomalacia of the right posterior parietal and occipital lobe suggesting chronic infarct. CT CERVICAL SPINE: 1. No acute fracture or traumatic subluxation. 2. Mild multilevel degenerate disc disease. Electronically Signed   By: Larose Hires D.O.   On: 06/11/2023 14:03    Disposition Plan & Communication  Patient status: Inpatient  Admitted From: Home Planned disposition location: Home Anticipated discharge date: 6/21 pending clinical improvement  Family Communication: husband at bedside    Author: Leeroy Bock, DO [facility] 06/13/2023, 8:15 AM   Available by Epic secure chat 7AM-7PM. If 7PM-7AM, please contact night-coverage.  [facility] contact information found on [email or web address].

## 2023-06-13 NOTE — Progress Notes (Signed)
   06/13/23 1500  Spiritual Encounters  Type of Visit Initial  Care provided to: Pt and family  Referral source Chaplain assessment  Reason for visit Routine spiritual support  OnCall Visit No  Spiritual Framework  Presenting Themes Meaning/purpose/sources of inspiration;Values and beliefs;Courage hope and growth;Impactful experiences and emotions  Patient Stress Factors Health changes  Family Stress Factors None identified  Interventions  Spiritual Care Interventions Made Compassionate presence;Established relationship of care and support;Reflective listening;Explored values/beliefs/practices/strengths;Encouragement  Intervention Outcomes  Outcomes Connection to spiritual care;Awareness around self/spiritual resourses  Spiritual Care Plan  Spiritual Care Issues Still Outstanding No further spiritual care needs at this time (see row info)   Chaplain doing rounds visited with pt and husband. Pt shared with chaplain that she has had a stroke, heart attack and pneumonia. Chaplin told the pt that she is a miracle. Pt agreed and said that she has a daughter that she needs to take care of and thinks that's why God still has her here. Pt and husband spoke with me about family dynamics, health complications and their love for one another. Pt is hoping to go home tomorrow. Chaplain told pt that she will continue to pray for her family and for her health.

## 2023-06-13 NOTE — TOC Initial Note (Signed)
Transition of Care [facility]) - Initial/Assessment Note    Patient Details  Name: Erika [name] MRN: [id number] Date of Birth: 1971-06-05  Transition of [facility]) CM/SW Contact:    Truddie Hidden, RN Phone Number: 06/13/2023, 4:17 PM  Clinical Narrative:                 Spoke with patient abou SDOH for utilities. Patient denied any needs.  She was advised to have her transportation bring her home oxygen at discharge.          Patient Goals and CMS Choice            Expected Discharge Plan and Services                                              Prior Living Arrangements/Services                       Activities of Daily Living Home Assistive Devices/Equipment: CBG Meter, Communication device (specify type) ADL Screening (condition at time of admission) Patient's cognitive ability adequate to safely complete daily activities?: Yes Is the patient deaf or have difficulty hearing?: No Does the patient have difficulty seeing, even when wearing glasses/contacts?: No Does the patient have difficulty concentrating, remembering, or making decisions?: No Patient able to express need for assistance with ADLs?: No Does the patient have difficulty dressing or bathing?: Yes Independently performs ADLs?: No Communication: Independent Dressing (OT): Needs assistance Is this a change from baseline?: Pre-admission baseline Does the patient have difficulty walking or climbing stairs?: Yes Weakness of Legs: Left Weakness of Arms/Hands: Left  Permission Sought/Granted                  Emotional Assessment              Admission diagnosis:  Hypoxia [R09.02] CAP (community acquired pneumonia) [J18.9] Community acquired pneumonia of right lower lobe of lung [J18.9] Sepsis with acute hypoxic respiratory failure without septic shock, due to unspecified organism (HCC) [A41.9, R65.20, J96.01] Patient Active Problem List   Diagnosis Date Noted    Community acquired pneumonia of right lower lobe of lung 06/13/2023   Hypoxia 06/13/2023   AKI (acute kidney injury) (HCC) 06/12/2023   Dyspnea 05/27/2023   Adventitious breath sounds 03/05/2023   Chronic respiratory failure with hypoxia (HCC) 10/03/2022   Complex sleep apnea syndrome 10/03/2022   Scalp psoriasis 06/11/2022   Coccyx pain 06/11/2022   History of tobacco abuse 05/25/2022   Dermatitis 03/19/2022   Mild vascular dementia without behavioral disturbance, psychotic disturbance, mood disturbance, or anxiety (HCC) 03/01/2022   Acute metabolic encephalopathy/delirium/polypharmacy 01/31/2022   Demand ischemia    Pressure injury of skin 01/27/2022   Cardiomegaly    Polypharmacy    COVID-19 virus infection 12/22/2021   Hyperglycemia 12/22/2021   Major depressive disorder, recurrent episode, mild (HCC) 11/26/2021   Ankle fracture, left    Closed avulsion fracture of medial malleolus of left tibia    Sepsis (HCC) 11/21/2021   Cerebrovascular accident (CVA) due to occlusion of vertebral artery (HCC) 09/11/2021   Cognitive change 09/11/2021   Cognitive communication deficit 08/10/2021   Urinary incontinence 08/10/2021   GERD (gastroesophageal reflux disease) 08/03/2021   Chronic post-traumatic stress disorder (PTSD)    Depression    Right pontine stroke (HCC) 06/28/2021   Hemiplegia and hemiparesis  following cerebral infarction affecting left non-dominant side (HCC) 06/24/2021   Chronic diastolic CHF (congestive heart failure) (HCC) 06/24/2021   Fall 06/24/2021   Abnormal LFTs 06/24/2021   Acute hypoxic respiratory failure (HCC) 06/24/2021   Olecranon bursitis of left elbow 06/12/2021   Chronic hip pain (Bilateral) 06/09/2021   Chronic use of opiate for therapeutic purpose 05/09/2021   Greater trochanteric bursitis of hip (Left) 03/09/2021   Bursitis of hip (Left) 02/23/2021   Uncomplicated opioid dependence (HCC) 02/20/2021   Acute conjunctivitis of left eye 01/02/2021    Unintentional weight loss 11/21/2020   Broken teeth (Right) 08/02/2020   History of MI (myocardial infarction) (July 2017) 08/02/2020   Neuropathy 06/09/2020   Dental abscess 06/09/2020   Foot drop (Left) 06/09/2020   Carotid stenosis, asymptomatic, right 04/27/2020   Migraine, chronic, without aura 11/03/2019   Thrombocytosis 09/16/2019   Erythrocytosis 09/16/2019   Hypercalcemia 09/06/2019   Leukocytosis 09/06/2019   Polycythemia 09/06/2019   Chronic hip pain (Left) 08/27/2019   Osteoarthritis of hip (Left) 08/27/2019   Gluteal tendonitis of buttock (Left) 08/27/2019   Radial nerve palsy (Right) 07/15/2019   Neuropathy of radial nerve (Right) 06/30/2019   Abnormal bruising 06/25/2019   History of carotid endarterectomy (Right) 03/04/2019   Pain medication agreement signed 03/04/2019   Atypical facial pain (Right) 01/27/2019   Chronic ear pain (Right) 01/27/2019   Chronic jaw pain (Right) 01/27/2019   Geniculate Neuralgia (Right) 01/27/2019   Vitamin D deficiency 01/19/2019   Neurogenic pain 01/19/2019   Chronic anticoagulation (PLAVIX) 01/19/2019   Chronic pain syndrome 01/07/2019   Long term current use of opiate analgesic 01/07/2019   Long term prescription benzodiazepine use 01/07/2019   Pharmacologic therapy 01/07/2019   Disorder of skeletal system 01/07/2019   Problems influencing health status 01/07/2019   Chronic headaches (1ry area of Pain) (Right) 01/07/2019   Opiate use 09/24/2018   PVD (peripheral vascular disease) (HCC) 06/24/2018   Chronic ankle pain (Bilateral) 12/27/2017   Tendinopathy of gluteus medius (Right) 12/27/2017   Tendinopathy of gluteus medius (Left) 12/27/2017   Bilateral hip pain 12/26/2017   Chronic elbow pain (Left) 10/17/2017   Elevated troponin I level 05/21/2017   Major depressive disorder, recurrent episode, moderate (HCC) 11/19/2016   Insomnia 10/30/2016   Constipation 07/25/2016   S/P CABG x 4 07/06/2016   Bradycardia    CAD  (coronary artery disease)    Carotid stenosis    CAD in native artery 06/29/2016   Elevated troponin 06/28/2016   Essential hypertension, malignant 06/28/2016   Mild tobacco abuse in early remission 06/28/2016   Essential hypertension    Malignant hypertension    Type 2 diabetes mellitus with other specified complication (HCC)    Chest pain with high risk for cardiac etiology 06/27/2016   NSTEMI (non-ST elevated myocardial infarction) (HCC) 06/27/2016   Proteinuria 03/15/2016   Renal artery stenosis (HCC) 03/15/2016   MDD (major depressive disorder) 10/17/2015   Agoraphobia with panic attacks 04/25/2015   HTN (hypertension), malignant 10/20/2013   Debility 04/02/2013   Cluster headache 03/20/2012   HLD (hyperlipidemia) 07/26/2010   PCP:  Eden Emms, NP Pharmacy:   [facility] [address] [facility] - [place], [place] - [address] AT [facility] [address] [place] [address] Phone: [phone] Fax: [phone]     Social Determinants of Health (SDOH) Social History: SDOH Screenings   Food Insecurity: No Food Insecurity (06/13/2023)  Housing: Patient Unable To Answer (06/13/2023)  Transportation Needs: No  Transportation Needs (06/13/2023)  Utilities: At Risk (06/13/2023)  Depression (PHQ2-9): Low Risk  (05/07/2023)  Recent Concern: Depression (PHQ2-9) - Medium Risk (04/18/2023)  Tobacco Use: Medium Risk (06/12/2023)   SDOH Interventions:     Readmission Risk Interventions    12/24/2021   12:23 PM 11/23/2021    1:18 PM  Readmission Risk Prevention Plan  Transportation Screening Complete Complete  Medication Review Oceanographer) Complete Complete  PCP or Specialist appointment within 3-5 days of discharge Complete Complete  HRI or Home Care Consult Complete Complete  SW Recovery Care/Counseling Consult  Complete  Palliative Care Screening  Not Applicable  Skilled Nursing Facility  Complete

## 2023-06-13 NOTE — Plan of Care (Signed)
  Problem: Education: Goal: Ability to describe self-care measures that may prevent or decrease complications (Diabetes Survival Skills Education) will improve Outcome: Progressing Goal: Individualized Educational Video(s) Outcome: Progressing   Problem: Coping: Goal: Ability to adjust to condition or change in health will improve Outcome: Progressing   Problem: Fluid Volume: Goal: Ability to maintain a balanced intake and output will improve Outcome: Progressing   Problem: Health Behavior/Discharge Planning: Goal: Ability to identify and utilize available resources and services will improve Outcome: Progressing Goal: Ability to manage health-related needs will improve Outcome: Progressing   Problem: Metabolic: Goal: Ability to maintain appropriate glucose levels will improve Outcome: Progressing   Problem: Nutritional: Goal: Maintenance of adequate nutrition will improve Outcome: Progressing   Problem: Skin Integrity: Goal: Risk for impaired skin integrity will decrease Outcome: Progressing   Problem: Clinical Measurements: Goal: Ability to maintain clinical measurements within normal limits will improve Outcome: Progressing Goal: Will remain free from infection Outcome: Progressing Goal: Diagnostic test results will improve Outcome: Progressing Goal: Respiratory complications will improve Outcome: Progressing Goal: Cardiovascular complication will be avoided Outcome: Progressing   Problem: Activity: Goal: Risk for activity intolerance will decrease Outcome: Progressing   Problem: Nutrition: Goal: Adequate nutrition will be maintained Outcome: Progressing   Problem: Coping: Goal: Level of anxiety will decrease Outcome: Progressing   Problem: Elimination: Goal: Will not experience complications related to bowel motility Outcome: Progressing Goal: Will not experience complications related to urinary retention Outcome: Progressing   Problem: Pain  Managment: Goal: General experience of comfort will improve Outcome: Progressing   Problem: Safety: Goal: Ability to remain free from injury will improve Outcome: Progressing   Problem: Skin Integrity: Goal: Risk for impaired skin integrity will decrease Outcome: Progressing

## 2023-06-13 NOTE — Progress Notes (Signed)
PHARMACIST - PHYSICIAN COMMUNICATION  CONCERNING:  Pharmacy was consulted to dose enoxaparin for DVT Prophylaxis  Filed Weights   06/13/23 0244  Weight: 90.8 kg (200 lb 2.8 oz)    Body mass index is 32.31 kg/m.  Estimated Creatinine Clearance: 96.6 mL/min (by C-G formula based on SCr of 0.74 mg/dL).   Based on [facility] policy patient is candidate for enoxaparin 0.5mg /kg TBW SQ every 24 hours based on BMI being >30.  DESCRIPTION: Pharmacy has adjusted enoxaparin dose per [facility] policy.  Patient is now receiving enoxaparin 0.5 mg/kg (~45 mg) every 24 hours    Elliot Gurney, PharmD, BCPS Clinical Pharmacist  06/13/2023 4:00 PM

## 2023-06-14 DIAGNOSIS — F331 Major depressive disorder, recurrent, moderate: Secondary | ICD-10-CM

## 2023-06-14 LAB — CBC
HCT: 32.1 % — ABNORMAL LOW (ref 36.0–46.0)
Hemoglobin: 9.5 g/dL — ABNORMAL LOW (ref 12.0–15.0)
MCH: 21.7 pg — ABNORMAL LOW (ref 26.0–34.0)
MCHC: 29.6 g/dL — ABNORMAL LOW (ref 30.0–36.0)
MCV: 73.5 fL — ABNORMAL LOW (ref 80.0–100.0)
Platelets: 499 10*3/uL — ABNORMAL HIGH (ref 150–400)
RBC: 4.37 MIL/uL (ref 3.87–5.11)
RDW: 17.4 % — ABNORMAL HIGH (ref 11.5–15.5)
WBC: 13.1 10*3/uL — ABNORMAL HIGH (ref 4.0–10.5)
nRBC: 0 % (ref 0.0–0.2)

## 2023-06-14 LAB — CULTURE, BLOOD (ROUTINE X 2)

## 2023-06-14 LAB — BASIC METABOLIC PANEL
Anion gap: 10 (ref 5–15)
BUN: 10 mg/dL (ref 6–20)
CO2: 25 mmol/L (ref 22–32)
Calcium: 9.2 mg/dL (ref 8.9–10.3)
Chloride: 103 mmol/L (ref 98–111)
Creatinine, Ser: 0.78 mg/dL (ref 0.44–1.00)
GFR, Estimated: >60 mL/min (ref >60)
Glucose, Bld: 90 mg/dL (ref 70–99)
Potassium: 3.8 mmol/L (ref 3.5–5.1)
Sodium: 138 mmol/L (ref 135–145)

## 2023-06-14 LAB — GLUCOSE, CAPILLARY: Glucose-Capillary: 157 mg/dL — ABNORMAL HIGH (ref 70–99)

## 2023-06-14 MED ORDER — CEPHALEXIN 500 MG PO CAPS: 500.0000 mg | ORAL_CAPSULE | Freq: Three times a day (TID) | ORAL | 0 refills | Status: AC

## 2023-06-14 MED ORDER — ASPIRIN 81 MG PO TBEC: 81.0000 mg | DELAYED_RELEASE_TABLET | Freq: Every day | ORAL | 0 refills | Status: DC

## 2023-06-14 MED ORDER — AZITHROMYCIN 250 MG PO TABS: 250.0000 mg | ORAL_TABLET | Freq: Every day | ORAL | 0 refills | Status: AC

## 2023-06-14 MED ORDER — POLYETHYLENE GLYCOL 3350 17 G PO PACK: 17.0000 g | PACK | Freq: Two times a day (BID) | ORAL | 0 refills | Status: AC

## 2023-06-14 MED ORDER — ALBUTEROL SULFATE (2.5 MG/3ML) 0.083% IN NEBU: 2.5000 mg | INHALATION_SOLUTION | RESPIRATORY_TRACT | 0 refills | Status: DC | PRN

## 2023-06-14 MED ORDER — SENNA 8.6 MG PO TABS: 1.0000 | ORAL_TABLET | Freq: Every day | ORAL | 0 refills | Status: AC

## 2023-06-14 MED ORDER — METOPROLOL SUCCINATE ER 25 MG PO TB24: 50.0000 mg | ORAL_TABLET | Freq: Every day | ORAL | 0 refills | Status: DC

## 2023-06-14 MED ORDER — CLONAZEPAM 0.5 MG PO TABS
0.2500 mg | ORAL_TABLET | Freq: Two times a day (BID) | ORAL | Status: DC
Start: 2023-06-14 — End: 2023-07-08

## 2023-06-14 NOTE — Consult Note (Signed)
[facility] Customer service manager [facility]) Accountable Care Organization (ACO) [facility] Liaison Note  06/14/2023  Erika [name] 10-03-1971 [id number]  Location: [facility] RN Hospital Liaison screened the patient remotely at [facility].  Insurance: [facility] Erika [name] is a 52 y.o. female who is a Primary Care Patient of Eden Emms, NP. The patient was screened for  readmission hospitalization with noted medium risk score for unplanned readmission risk with 1 IP/1 ED in 6 months.  The patient was assessed for potential [facility]) Care Management service needs for post hospital transition for care coordination. Review of patient's electronic medical record reveals patient was admitted with Sepsis secondary with hypoxia.   Plan: [facility] Liaison will continue to follow progress and disposition to asess for post hospital community care coordination/management needs.  Referral request for community care coordination: Anticipate provider office    [facility] Care Management/Population Health does not replace or interfere with any arrangements made by the Inpatient Transition of Care team.   For questions contact:   [name], RN, BSN [facility] Liaison [place]   [facility]  Population Health Office Hours MTWF  8:00 am-6:00 pm Off on Thursday [phone] mobile [phone] [Office toll free line][facility] Office Hours are M-F 8:30 - 5 pm 24 hour nurse advise line [phone] Concierge  [email or web address]

## 2023-06-14 NOTE — Plan of Care (Signed)
  Problem: Education: Goal: Ability to describe self-care measures that may prevent or decrease complications (Diabetes Survival Skills Education) will improve Outcome: Progressing Goal: Individualized Educational Video(s) Outcome: Progressing   Problem: Coping: Goal: Ability to adjust to condition or change in health will improve Outcome: Progressing   Problem: Fluid Volume: Goal: Ability to maintain a balanced intake and output will improve Outcome: Progressing   Problem: Health Behavior/Discharge Planning: Goal: Ability to identify and utilize available resources and services will improve Outcome: Progressing Goal: Ability to manage health-related needs will improve Outcome: Progressing   Problem: Metabolic: Goal: Ability to maintain appropriate glucose levels will improve Outcome: Progressing   Problem: Nutritional: Goal: Maintenance of adequate nutrition will improve Outcome: Progressing   Problem: Skin Integrity: Goal: Risk for impaired skin integrity will decrease Outcome: Progressing   Problem: Clinical Measurements: Goal: Ability to maintain clinical measurements within normal limits will improve Outcome: Progressing Goal: Will remain free from infection Outcome: Progressing Goal: Diagnostic test results will improve Outcome: Progressing Goal: Respiratory complications will improve Outcome: Progressing Goal: Cardiovascular complication will be avoided Outcome: Progressing   Problem: Activity: Goal: Risk for activity intolerance will decrease Outcome: Progressing   Problem: Coping: Goal: Level of anxiety will decrease Outcome: Progressing   Problem: Nutrition: Goal: Adequate nutrition will be maintained Outcome: Progressing   Problem: Elimination: Goal: Will not experience complications related to bowel motility Outcome: Progressing Goal: Will not experience complications related to urinary retention Outcome: Progressing   Problem: Pain  Managment: Goal: General experience of comfort will improve Outcome: Progressing   Problem: Safety: Goal: Ability to remain free from injury will improve Outcome: Progressing   Problem: Skin Integrity: Goal: Risk for impaired skin integrity will decrease Outcome: Progressing

## 2023-06-14 NOTE — Discharge Summary (Signed)
Physician Discharge Summary  Patient: Erika [name] ZOX:[id number] DOB: 03-13-1971   Code Status: DNR Admit date: 06/12/2023 Discharge date: 06/14/2023 Disposition: Home, No home health services recommended PCP: Eden Emms, NP  Recommendations for Outpatient Follow-up:  Follow up with PCP within 1-2 weeks Regarding general hospital follow up and preventative care Recommend pulmonology follow up   Discharge Diagnoses:  Principal Problem:   Sepsis (HCC) Active Problems:   Acute hypoxic respiratory failure (HCC)   AKI (acute kidney injury) (HCC)   Chronic diastolic CHF (congestive heart failure) (HCC)   CAD (coronary artery disease)   Essential hypertension   Type 2 diabetes mellitus with other specified complication (HCC)   Community acquired pneumonia of right lower lobe of lung   Hypoxia  Brief Hospital Course Summary: Erika [name] is a 52 y.o. female with a PMH significant for chronic hypoxic respiratory failure on 5 L, CAD s/p CABG (2007), CVA with residual left-sided deficits, hypertension, PAD s/p stenting, type 2 diabetes, hyperlipidemia, HFpEF, complex sleep apnea syndrome.   They presented from home to the ED on 06/12/2023 with SOB x 7 days. Has frequent history of aspiration.   In the ED, it was found that they had normotensive at 96/69 with heart rate of 103. She was saturating at 96% on BiPAP. She was afebrile at 97.8.  Significant findings included WBC of 22.1 with hemoglobin of 12.4, platelets of 567, creatinine 1.26 with GFR 52.  BNP and troponin are negative.  Lactic acid elevated 2.3.Chest x-ray was obtained that demonstrated persistent ill-defined opacity in the right lung base.   They were initially treated with azithromycin and cefepime.    Patient was admitted to medicine service for further workup and management of CAP as outlined in detail below.   AKI resolved with supportive care.  She progressed well with IV Abx and oxygen support. She  wears oxygen at night and PRN at home but per report has had an increase in her use of it. She is requiring it full time inpatient.  Has been able to transition to PO antibiotics and was prescribed further course after discharge.  Already has follow up with pulmonology scheduled.   All other chronic conditions were treated with home medications.   Discharge Condition: Good, improved Recommended discharge diet: Regular healthy diet  Consultations: None    Procedures/Studies: None   Allergies as of 06/14/2023       Reactions   Chantix [varenicline Tartrate] Other (See Comments)   Feels "crazy" and angry        Medication List     STOP taking these medications    Aimovig 70 MG/ML Soaj Generic drug: Erenumab-aooe   aspirin 81 MG chewable tablet Replaced by: aspirin EC 81 MG tablet   Nurtec 75 MG Tbdp Generic drug: Rimegepant Sulfate   rosuvastatin 20 MG tablet Commonly known as: CRESTOR       TAKE these medications    acetaminophen 325 MG tablet Commonly known as: TYLENOL Take 1-2 tablets (325-650 mg total) by mouth every 4 (four) hours as needed for mild pain.   albuterol 108 (90 Base) MCG/ACT inhaler Commonly known as: VENTOLIN HFA Inhale 2 puffs into the lungs every 6 (six) hours as needed for wheezing.   aspirin EC 81 MG tablet Take 1 tablet (81 mg total) by mouth daily. Swallow whole. Replaces: aspirin 81 MG chewable tablet   azithromycin 250 MG tablet Commonly known as: Zithromax Z-Pak Take 1 tablet (250 mg total) by  mouth daily for 3 days. Take 2 tablets (500 mg) on  Day 1,  followed by 1 tablet (250 mg) once daily on Days 2 through 5.   cephALEXin 500 MG capsule Commonly known as: KEFLEX Take 1 capsule (500 mg total) by mouth 3 (three) times daily for 5 days.   chlorproMAZINE 25 MG tablet Commonly known as: THORAZINE Take 3 tablets (75 mg total) by mouth at bedtime.   clonazePAM 0.5 MG tablet Commonly known as: KlonoPIN Take 0.5 tablets (0.25  mg total) by mouth 2 (two) times daily. Take 1/2 tab twice daily. No dissolving tablets.   clopidogrel 75 MG tablet Commonly known as: PLAVIX Take 1 tablet (75 mg total) by mouth daily.   cyclobenzaprine 10 MG tablet Commonly known as: FLEXERIL TAKE ONE TABLET BY MOUTH AT BEDTIME   Dexcom G7 Sensor Misc APPLY ONE SENSOR TO THE BACK OF YOUR UPPER ARM. REPLACE EVERY 10 DAYS.   escitalopram 20 MG tablet Commonly known as: Lexapro Take 1 tablet (20 mg total) by mouth daily.   icosapent Ethyl 1 g capsule Commonly known as: VASCEPA Take 2 g by mouth 2 (two) times daily.   insulin detemir 100 UNIT/ML injection Commonly known as: LEVEMIR Inject 0.27 mLs (27 Units total) into the skin daily.   lamoTRIgine 200 MG tablet Commonly known as: LAMICTAL Take 1 tablet (200 mg total) by mouth at bedtime.   lamoTRIgine 25 MG tablet Commonly known as: LaMICtal Take 1 tablet (25 mg total) by mouth daily.   metoprolol succinate 50 MG 24 hr tablet Commonly known as: TOPROL-XL Take 1 tablet (50 mg total) by mouth daily. Take with or immediately following a meal.   nitroGLYCERIN 0.4 MG SL tablet Commonly known as: NITROSTAT Place 1 tablet (0.4 mg total) under the tongue every 5 (five) minutes as needed for chest pain.   Oxycodone HCl 10 MG Tabs Take 1 tablet (10 mg total) by mouth 2 (two) times daily as needed.   pantoprazole 40 MG tablet Commonly known as: PROTONIX TAKE ONE TABLET BY MOUTH ONE TIME DAILY   polyethylene glycol 17 g packet Commonly known as: MIRALAX / GLYCOLAX Take 17 g by mouth 2 (two) times daily.   pregabalin 225 MG capsule Commonly known as: LYRICA Take 1 capsule (225 mg total) by mouth 2 (two) times daily.   Repatha SureClick 140 MG/ML Soaj Generic drug: Evolocumab Inject 140 mg into the skin as directed. Inject 1 Dose into the skin every 14 (fourteen) days.   senna 8.6 MG Tabs tablet Commonly known as: SENOKOT Take 1 tablet (8.6 mg total) by mouth daily.    Spiriva Respimat 2.5 MCG/ACT Aers Generic drug: Tiotropium Bromide Monohydrate Inhale 2 puffs into the lungs daily.   tirzepatide 7.5 MG/0.5ML Pen Commonly known as: MOUNJARO Inject 7.5 mg into the skin once a week.   topiramate 25 MG tablet Commonly known as: Topamax Take 1 tablet (25 mg total) by mouth at bedtime.   UltiCare Mini Pen Needles 31G X 6 MM Misc Generic drug: Insulin Pen Needle USE PEN NEEDLES TWO TIMES DAILY         Subjective   Pt reports no complaints. Denies dyspnea. Feels well overall. Agrees to full time oxygen use.   All questions and concerns were addressed at time of discharge.  Objective  Blood pressure (!) 112/52, pulse 81, temperature 97.7 F (36.5 C), temperature source Oral, resp. rate 19, weight 90.8 kg, SpO2 94 %.   General: Pt is alert, awake,  not in acute distress Cardiovascular: RRR, S1/S2 +, no rubs, no gallops Respiratory: CTA bilaterally, no wheezing, no rhonchi Abdominal: Soft, NT, ND, bowel sounds + Extremities: no edema, no cyanosis  The results of significant diagnostics from this hospitalization (including imaging, microbiology, ancillary and laboratory) are listed below for reference.   Imaging studies: CT CHEST WO CONTRAST  Result Date: 06/12/2023 CLINICAL DATA:  Respiratory illness.  Shortness of breath EXAM: CT CHEST WITHOUT CONTRAST TECHNIQUE: Multidetector CT imaging of the chest was performed following the standard protocol without IV contrast. RADIATION DOSE REDUCTION: This exam was performed according to the departmental dose-optimization program which includes automated exposure control, adjustment of the mA and/or kV according to patient size and/or use of iterative reconstruction technique. COMPARISON:  X-ray earlier 06/12/2023.  CT 05/29/2023 FINDINGS: Cardiovascular: Status post median sternotomy. Coronary artery calcifications are seen. The thoracic aorta has a overall normal course and caliber with calcified plaque.  Surgical changes along the ascending aorta. There is a bovine type aortic arch, normal variant. The left vertebral artery also originates directly from the aortic arch proximal to the left subclavian artery origin, also a variant. No significant pericardial effusion. Mediastinum/Nodes: Slightly patulous thoracic esophagus. On this non IV contrast exam there is no specific abnormal lymph node enlargement identified in the axillary region, hilum or mediastinum. There are some small nodes identified in the mediastinum, similar to previous. Largest is again seen subcarinal were previous study had short axis dimension approaching 16 mm and today similar on series 2, image 70. Lungs/Pleura: Worsening consolidative opacities along both lower lobes. Few patchy areas of opacity as well with some reticular changes involving the dependent right upper lobe and middle lobe. No pneumothorax or significant effusion. Significant breathing motion Upper Abdomen: Adrenal glands are preserved in the upper abdomen. Few prominent upper abdominal nodes are also seen, unchanged from previous. Again measuring for example 9 mm in short axis on series 2, image 127. Musculoskeletal: Scattered degenerative changes are seen along the spine. IMPRESSION: Worsening lower lobe consolidation with some subtle areas in the middle lobe and upper lobe dependently. No significant pleural effusion. Recommend continued follow-up. Stable borderline and mildly enlarged mediastinal and upper abdominal nodes. Postop chest. Aortic Atherosclerosis (ICD10-I70.0). Electronically Signed   By: Karen Kays M.D.   On: 06/12/2023 16:14   DG Chest Port 1 View  Result Date: 06/12/2023 CLINICAL DATA:  Provided history: Questionable sepsis-evaluate for abnormality. EXAM: PORTABLE CHEST 1 VIEW COMPARISON:  Chest CT 05/29/2023. Prior chest radiographs 03/05/2023 and earlier. FINDINGS: Prior median sternotomy/CABG. As before, there are fractures within upper sternal  cerclage wires. Mild cardiomegaly. Persistent ill-defined opacity within the right lung base. No appreciable airspace consolidation on the left. No evidence of pleural effusion or pneumothorax. No acute osseous abnormality identified. IMPRESSION: Persistent ill-defined opacity within the right lung base compatible with pneumonia. Radiographic follow-up to complete resolution recommended to exclude underlying malignancy. Electronically Signed   By: Jackey Loge D.O.   On: 06/12/2023 13:41   CT Head Wo Contrast  Result Date: 06/11/2023 CLINICAL DATA:  Blunt polytrauma.  Patient from home after a fall. EXAM: CT HEAD WITHOUT CONTRAST CT CERVICAL SPINE WITHOUT CONTRAST TECHNIQUE: Multidetector CT imaging of the head and cervical spine was performed following the standard protocol without intravenous contrast. Multiplanar CT image reconstructions of the cervical spine were also generated. RADIATION DOSE REDUCTION: This exam was performed according to the departmental dose-optimization program which includes automated exposure control, adjustment of the mA and/or kV according to patient  size and/or use of iterative reconstruction technique. COMPARISON:  MRI examination dated January 26, 2022 FINDINGS: CT HEAD FINDINGS Brain: No evidence of acute infarction, hemorrhage, hydrocephalus, extra-axial collection or mass lesion/mass effect. Encephalomalacia of the right posterior parietal and occipital lobe suggesting chronic infarct. Vascular: No hyperdense vessel or unexpected calcification. Skull: Large left posterior parietal/occipital scalp hematoma without evidence of calvarial fracture. Sinuses/Orbits: No acute finding. Other: None. CT CERVICAL SPINE FINDINGS Alignment: Straightening of the cervical spine. Skull base and vertebrae: No acute fracture. No primary bone lesion or focal pathologic process. Soft tissues and spinal canal: No prevertebral fluid or swelling. No visible canal hematoma. Disc levels: Mild  multilevel degenerate disc disease. No significant spinal canal or neural foraminal stenosis. Upper chest: Negative. Other: None IMPRESSION: CT HEAD: 1. No acute intracranial abnormality. 2. Large left posterior parietal/occipital scalp hematoma without evidence of calvarial fracture. 3. Encephalomalacia of the right posterior parietal and occipital lobe suggesting chronic infarct. CT CERVICAL SPINE: 1. No acute fracture or traumatic subluxation. 2. Mild multilevel degenerate disc disease. Electronically Signed   By: Larose Hires D.O.   On: 06/11/2023 14:03   CT Cervical Spine Wo Contrast  Result Date: 06/11/2023 CLINICAL DATA:  Blunt polytrauma.  Patient from home after a fall. EXAM: CT HEAD WITHOUT CONTRAST CT CERVICAL SPINE WITHOUT CONTRAST TECHNIQUE: Multidetector CT imaging of the head and cervical spine was performed following the standard protocol without intravenous contrast. Multiplanar CT image reconstructions of the cervical spine were also generated. RADIATION DOSE REDUCTION: This exam was performed according to the departmental dose-optimization program which includes automated exposure control, adjustment of the mA and/or kV according to patient size and/or use of iterative reconstruction technique. COMPARISON:  MRI examination dated January 26, 2022 FINDINGS: CT HEAD FINDINGS Brain: No evidence of acute infarction, hemorrhage, hydrocephalus, extra-axial collection or mass lesion/mass effect. Encephalomalacia of the right posterior parietal and occipital lobe suggesting chronic infarct. Vascular: No hyperdense vessel or unexpected calcification. Skull: Large left posterior parietal/occipital scalp hematoma without evidence of calvarial fracture. Sinuses/Orbits: No acute finding. Other: None. CT CERVICAL SPINE FINDINGS Alignment: Straightening of the cervical spine. Skull base and vertebrae: No acute fracture. No primary bone lesion or focal pathologic process. Soft tissues and spinal canal: No  prevertebral fluid or swelling. No visible canal hematoma. Disc levels: Mild multilevel degenerate disc disease. No significant spinal canal or neural foraminal stenosis. Upper chest: Negative. Other: None IMPRESSION: CT HEAD: 1. No acute intracranial abnormality. 2. Large left posterior parietal/occipital scalp hematoma without evidence of calvarial fracture. 3. Encephalomalacia of the right posterior parietal and occipital lobe suggesting chronic infarct. CT CERVICAL SPINE: 1. No acute fracture or traumatic subluxation. 2. Mild multilevel degenerate disc disease. Electronically Signed   By: Larose Hires D.O.   On: 06/11/2023 14:03   CT Chest Wo Contrast  Result Date: 06/07/2023 CLINICAL DATA:  Increased O2 needs.  Abnormal chest radiograph. EXAM: CT CHEST WITHOUT CONTRAST TECHNIQUE: Multidetector CT imaging of the chest was performed following the standard protocol without IV contrast. RADIATION DOSE REDUCTION: This exam was performed according to the departmental dose-optimization program which includes automated exposure control, adjustment of the mA and/or kV according to patient size and/or use of iterative reconstruction technique. COMPARISON:  Chest radiograph 03/05/2023 and CT chest 07/03/2021. FINDINGS: Cardiovascular: Atherosclerotic calcification of the aorta and coronary arteries. Heart is mildly enlarged. No pericardial effusion. Mediastinum/Nodes: Subcarinal lymph node measures 1.6 cm and most likely reactive in etiology. Mediastinal and thoracic inlet lymph nodes are otherwise not enlarged  by CT size criteria. Hilar regions are difficult to evaluate without IV contrast. No axillary adenopathy. Esophagus is grossly unremarkable. Lungs/Pleura: Peribronchovascular nodularity throughout the lungs with consolidation in both lower lobes. No pleural fluid. Airway is unremarkable. Upper Abdomen: Visualized portions of the liver, adrenal glands, left kidney, spleen, pancreas, stomach and bowel are grossly  unremarkable. 9 mm gastrohepatic ligament lymph node, unchanged. Musculoskeletal: Degenerative changes in the spine. IMPRESSION: 1. Multilobar pneumonia. The possibility of aspiration is also considered. 2. Aortic atherosclerosis (ICD10-I70.0). Coronary artery calcification. Electronically Signed   By: Leanna Battles M.D.   On: 06/07/2023 08:18   VAS US CAROTID  Result Date: 05/29/2023 Carotid Arterial Duplex Study Patient Name:  IOLA TURRI  Date of Exam:   05/28/2023 Medical Rec #: [id number]            Accession #:    [id number] Date of Birth: 12/19/1971           Patient Gender: F Patient Age:   66 years Exam Location:  [place] Procedure:      VAS US CAROTID Referring Phys: Jerolyn Center ARIDA --------------------------------------------------------------------------------  Indications:       Right endarterectomy and Patient here for follow-up carotid                    arterial disease with history of CVA 06/2021. Risk Factors:      Hypertension, hyperlipidemia, Diabetes, past history of                    smoking, prior MI, coronary artery disease, prior CVA, PAD. Other Factors:     Right carotid endarterectomy 07/2016, 04/2020. Comparison Study:  Prior carotid duplex exam on 05/25/2022 showed highest                    velocities in right mid ICA 97/37 cm/s and left mid ICA 99/39                    cm/s. Left vertebral artery appears occluded. Performing Technologist: Carlos American RVT, RDCS (AE), RDMS  Examination Guidelines: A complete evaluation includes B-mode imaging, spectral Doppler, color Doppler, and power Doppler as needed of all accessible portions of each vessel. Bilateral testing is considered an integral part of a complete examination. Limited examinations for reoccurring indications may be performed as noted.  Right Carotid Findings: +----------+--------+--------+--------+--------------------+-------------------+           PSV cm/sEDV cm/sStenosisPlaque Description  Comments             +----------+--------+--------+--------+--------------------+-------------------+ CCA Prox  144     43              homogeneous and                                                           smooth                                  +----------+--------+--------+--------+--------------------+-------------------+ CCA Mid   62      24      <50%    homogeneous and     increased reduction  smooth              of lumen size       +----------+--------+--------+--------+--------------------+-------------------+ CCA Distal76      32              homogeneous, smooth                                                       and calcific                            +----------+--------+--------+--------+--------------------+-------------------+ ICA Prox  104     47                                  intimal thickening  +----------+--------+--------+--------+--------------------+-------------------+ ICA Mid   114     47      1-39%                       intimal thickening  +----------+--------+--------+--------+--------------------+-------------------+ ICA Distal114     49                                                      +----------+--------+--------+--------+--------------------+-------------------+ ECA       94      12                                                      +----------+--------+--------+--------+--------------------+-------------------+ +----------+--------+-------+----------------+-------------------+           PSV cm/sEDV cmsDescribe        Arm Pressure (mmHG) +----------+--------+-------+----------------+-------------------+ Subclavian172            Multiphasic, WNL110                 +----------+--------+-------+----------------+-------------------+ +---------+--------+--+--------+--+---------+ VertebralPSV cm/s81EDV cm/s26Antegrade +---------+--------+--+--------+--+---------+  Left Carotid  Findings: +----------+--------+--------+--------+---------------------+------------------+           PSV cm/sEDV cm/sStenosisPlaque Description   Comments           +----------+--------+--------+--------+---------------------+------------------+ CCA Prox  115     30                                                      +----------+--------+--------+--------+---------------------+------------------+ CCA Mid   123     45                                                      +----------+--------+--------+--------+---------------------+------------------+ CCA Distal81      29                                                      +----------+--------+--------+--------+---------------------+------------------+  ICA Prox  113     47      1-39%   diffuse, irregular   intimal thickening                                   and calcific                            +----------+--------+--------+--------+---------------------+------------------+ ICA Mid   107     46                                                      +----------+--------+--------+--------+---------------------+------------------+ ICA Distal88      39                                                      +----------+--------+--------+--------+---------------------+------------------+ ECA       320     59      >50%                                            +----------+--------+--------+--------+---------------------+------------------+ +----------+--------+--------+--------+-------------------+           PSV cm/sEDV cm/sDescribeArm Pressure (mmHG) +----------+--------+--------+--------+-------------------+ [id number]                                         +----------+--------+--------+--------+-------------------+ +---------+--------+--------+--------------+ VertebralPSV cm/sEDV cm/sNot identified +---------+--------+--------+--------------+ Left brachial pressure not done due to  paralysis.  Summary: Right Carotid: Velocities in the right ICA are consistent with a 1-39% stenosis.                Non-hemodynamically significant plaque <50% noted in the CCA. S/P                CEA x 2. Left Carotid: Velocities in the left ICA are consistent with a 1-39% stenosis. Vertebrals:  Right vertebral artery demonstrates antegrade flow. Left vertebral              artery demonstrates an occlusion. Subclavians: Normal flow hemodynamics were seen in bilateral subclavian              arteries. *See table(s) above for measurements and observations. Suggest follow up study in 12 months. Electronically signed by Julien Nordmann MD on 05/29/2023 at 12:15:48 PM.    Final     Labs: Basic Metabolic Panel: Recent Labs  Lab 06/12/23 1229 06/13/23 0556 06/14/23 0442  NA 136 137 138  K 3.9 3.8 3.8  CL 99 103 103  CO2 27 27 25   GLUCOSE 179* 105* 90  BUN 16 14 10   CREATININE 1.26* 0.74 0.78  CALCIUM 9.9 9.4 9.2   CBC: Recent Labs  Lab 06/12/23 1229 06/13/23 0556 06/13/23 0955 06/14/23 0442  WBC 22.1* 19.1*  --  13.1*  NEUTROABS 20.0* 16.3*  --   --   HGB 12.4 8.4* 8.5* 9.5*  HCT 43.3 28.2*  --  32.1*  MCV 73.0* 72.3*  --  73.5*  PLT 567* 428*  --  499*   Microbiology: Results for orders placed or performed during the hospital encounter of 06/12/23  Blood Culture (routine x 2)     Status: None   Collection Time: 06/12/23 12:31 PM   Specimen: BLOOD  Result Value Ref Range Status   Specimen Description BLOOD RIGHT [facility]  Final   Special Requests   Final    BOTTLES DRAWN AEROBIC AND ANAEROBIC Blood Culture adequate volume   Culture   Final    NO GROWTH 5 DAYS Performed at [facility], [address], [place], [place] [address]    Report Status 06/17/2023 FINAL  Final  Blood Culture (routine x 2)     Status: None   Collection Time: 06/12/23 12:32 PM   Specimen: BLOOD  Result Value Ref Range Status   Specimen Description BLOOD RIGHT HAND  Final   Special Requests   Final     BOTTLES DRAWN AEROBIC AND ANAEROBIC Blood Culture adequate volume   Culture   Final    NO GROWTH 5 DAYS Performed at [facility], [address] [place], [place] [address]    Report Status 06/17/2023 FINAL  Final   *Note: Due to a large number of results and/or encounters for the requested time period, some results have not been displayed. A complete set of results can be found in Results Review.   Time coordinating discharge: Over 30 minutes  Leeroy Bock, MD  [facility] 06/14/2023, 9:57 AM

## 2023-06-14 NOTE — TOC Transition Note (Signed)
Transition of Care [facility]) - CM/SW Discharge Note   Patient Details  Name: Erika [name] MRN: [id number] Date of Birth: [birth year]  Transition of Care [facility]) CM/SW Contact:  Truddie Hidden, RN Phone Number: 06/14/2023, 12:55 PM   Clinical Narrative:    Spoke with patient's spouse regarding discharge home today.  He was advised a nebulizer was requested via [employer] and the representative would reach out to him.  Patient spouse reports patient oxygen is supplied via [employer]. He would like that changed to [employer]. He was also informed per the [employer] rep, Morrie Sheldon patient would likely have a high copay. Patient spouse states he is aware of the co pays. He will bring patient's home oxygen for her to discharge.  TOC signing off.            Patient Goals and CMS Choice      Discharge Placement                         Discharge Plan and Services Additional resources added to the After Visit Summary for                                       Social Determinants of Health (SDOH) Interventions SDOH Screenings   Food Insecurity: No Food Insecurity (06/13/2023)  Housing: Patient Unable To Answer (06/13/2023)  Transportation Needs: No Transportation Needs (06/13/2023)  Utilities: At Risk (06/13/2023)  Depression (PHQ2-9): Low Risk  (05/07/2023)  Recent Concern: Depression (PHQ2-9) - Medium Risk (04/18/2023)  Tobacco Use: Medium Risk (06/12/2023)     Readmission Risk Interventions    12/24/2021   12:23 PM 11/23/2021    1:18 PM  Readmission Risk Prevention Plan  Transportation Screening Complete Complete  Medication Review Oceanographer) Complete Complete  PCP or Specialist appointment within 3-5 days of discharge Complete Complete  HRI or Home Care Consult Complete Complete  SW Recovery Care/Counseling Consult  Complete  Palliative Care Screening  Not Applicable  Skilled Nursing Facility  Complete

## 2023-06-14 NOTE — Discharge Instructions (Signed)
Continue to take your antibiotics until complete. You actually will have TWO antibiotics, we had discussed just the one.  Keep your close follow up with pulmonology to review her oxygen requirements. I recommend wearing oxygen full time until then at least.

## 2023-06-15 LAB — CULTURE, BLOOD (ROUTINE X 2): Culture: NO GROWTH

## 2023-06-16 LAB — CULTURE, BLOOD (ROUTINE X 2)

## 2023-06-17 ENCOUNTER — Telehealth: Payer: Self-pay | Admitting: *Deleted

## 2023-06-17 LAB — CULTURE, BLOOD (ROUTINE X 2): Special Requests: ADEQUATE

## 2023-06-17 NOTE — Transitions of Care (Post Inpatient/ED Visit) (Signed)
   06/17/2023  Name: Erika [name] MRN: [id number] DOB: [birth year]  Today's TOC FU Call Status: Today's TOC FU Call Status:: Unsuccessul Call (1st Attempt) Unsuccessful Call (1st Attempt) Date: 06/17/23  Attempted to reach the patient regarding the most recent Inpatient/ED visit.  Follow Up Plan: Additional outreach attempts will be made to reach the patient to complete the Transitions of Care (Post Inpatient/ED visit) call.   Gean Maidens BSN RN [facility] [phone]

## 2023-06-18 ENCOUNTER — Telehealth: Payer: Self-pay | Admitting: *Deleted

## 2023-06-18 NOTE — Transitions of Care (Post Inpatient/ED Visit) (Signed)
   06/18/2023  Name: Erika [name] MRN: [id number] DOB: July 02, 1971  Today's TOC FU Call Status: Today's TOC FU Call Status:: Unsuccessful Call (2nd Attempt) Unsuccessful Call (2nd Attempt) Date: 06/18/23  Attempted to reach the patient regarding the most recent Inpatient/ED visit.  Follow Up Plan: Additional outreach attempts will be made to reach the patient to complete the Transitions of Care (Post Inpatient/ED visit) call.   Gean Maidens BSN RN [facility] [phone]

## 2023-06-19 ENCOUNTER — Telehealth: Payer: Self-pay | Admitting: *Deleted

## 2023-06-19 NOTE — Transitions of Care (Post Inpatient/ED Visit) (Signed)
   06/19/2023  Name: Erika [name] MRN: [id number] DOB: 1971/10/29  Today's TOC FU Call Status: Today's TOC FU Call Status:: Unsuccessful Call (3rd Attempt) Unsuccessful Call (3rd Attempt) Date: 06/19/23  Attempted to reach the patient regarding the most recent Inpatient/ED visit.  Follow Up Plan: No further outreach attempts will be made at this time. We have been unable to contact the patient.  Gean Maidens BSN RN [facility] [phone]

## 2023-06-19 NOTE — Transitions of Care (Post Inpatient/ED Visit) (Signed)
06/19/2023  Name: Erika [name] MRN: [id number] DOB: 1971-03-25  Today's TOC FU Call Status: Today's TOC FU Call Status:: Successful TOC FU Call Competed TOC FU Call Complete Date: 06/19/23  Transition Care Management Follow-up Telephone Call Date of Discharge: 06/14/23 Discharge Facility: [facility]) Type of Discharge: Inpatient Admission Primary Inpatient Discharge Diagnosis:: sepsis How have you been since you were released from the hospital?: Better Any questions or concerns?: No  Items Reviewed: Did you receive and understand the discharge instructions provided?: Yes Medications obtained,verified, and reconciled?: Yes (Medications Reviewed) Any new allergies since your discharge?: No Dietary orders reviewed?: No Do you have support at home?: Yes People in Home: spouse Name of Support/Comfort Primary Source: [name]  Medications Reviewed Today: Medications Reviewed Today     Reviewed by Luella Cook, RN (Case Manager) on 06/19/23 at 1158  Med List Status: <None>   Medication Order Taking? Sig Documenting Provider Last Dose Status Informant  acetaminophen (TYLENOL) 325 MG tablet [id number] Yes Take 1-2 tablets (325-650 mg total) by mouth every 4 (four) hours as needed for mild pain. Jacquelynn Cree, PA-C Taking Active Spouse/Significant Other  albuterol (PROVENTIL) (2.5 MG/3ML) 0.083% nebulizer solution [id number] Yes Take 3 mLs (2.5 mg total) by nebulization every 4 (four) hours as needed for wheezing or shortness of breath. Leeroy Bock, MD Taking Active   albuterol (VENTOLIN HFA) 108 (90 Base) MCG/ACT inhaler [id number] Yes Inhale 2 puffs into the lungs every 6 (six) hours as needed for wheezing. Eden Emms, NP Taking Active Spouse/Significant Other  aspirin EC 81 MG tablet [id number] Yes Take 1 tablet (81 mg total) by mouth daily. Swallow whole. Leeroy Bock, MD Taking Active   cephALEXin [facility]) 500 MG capsule [id number]  Yes Take 1 capsule (500 mg total) by mouth 3 (three) times daily for 5 days. Leeroy Bock, MD Taking Active   chlorproMAZINE (THORAZINE) 25 MG tablet [id number] Yes Take 3 tablets (75 mg total) by mouth at bedtime. Cleotis Nipper, MD Taking Active Spouse/Significant Other  clonazePAM (KLONOPIN) 0.5 MG tablet [id number] Yes Take 0.5 tablets (0.25 mg total) by mouth 2 (two) times daily. Take 1/2 tab twice daily. No dissolving tablets. Leeroy Bock, MD Taking Active   clopidogrel (PLAVIX) 75 MG tablet [id number] Yes Take 1 tablet (75 mg total) by mouth daily. Sater, Pearletha Furl, MD Taking Active Spouse/Significant Other  Continuous Glucose Sensor (DEXCOM G7 SENSOR) MISC [id number] Yes APPLY ONE SENSOR TO THE BACK OF YOUR UPPER ARM. REPLACE EVERY 10 DAYS. Eden Emms, NP Taking Active Spouse/Significant Other  cyclobenzaprine (FLEXERIL) 10 MG tablet [id number] Yes TAKE ONE TABLET BY MOUTH AT BEDTIME Jones Bales, NP Taking Active Spouse/Significant Other  escitalopram (LEXAPRO) 20 MG tablet [id number] Yes Take 1 tablet (20 mg total) by mouth daily. Cleotis Nipper, MD Taking Active Spouse/Significant Other  Evolocumab [facility] SURECLICK) 140 MG/ML Ivory Broad [id number] Yes Inject 140 mg into the skin as directed. Inject 1 Dose into the skin every 14 (fourteen) days. Fransico Michael, Cadence H, PA-C Taking Active Spouse/Significant Other  icosapent Ethyl (VASCEPA) 1 g capsule [id number] Yes Take 2 g by mouth 2 (two) times daily. [provider] Taking Active Spouse/Significant Other  insulin detemir (LEVEMIR) 100 UNIT/ML injection [id number] Yes Inject 0.27 mLs (27 Units total) into the skin daily. Eden Emms, NP Taking Active Spouse/Significant Other  lamoTRIgine (LAMICTAL) 200 MG tablet [id number] Yes Take 1 tablet (200 mg total) by mouth at bedtime. Arfeen, Phillips Grout,  MD Taking Active Spouse/Significant Other           Med Note Sharia Reeve   Wed Jun 12, 2023  4:24 PM) Takes along with 25 mg  daily at bedtime  lamoTRIgine (LAMICTAL) 25 MG tablet [id number] Yes Take 1 tablet (25 mg total) by mouth daily. Cleotis Nipper, MD Taking Active Spouse/Significant Other           Med Note Harlow Asa Jun 12, 2023  4:23 PM) Takes along with 200 mg daily at bedtime  metoprolol succinate (TOPROL-XL) 25 MG 24 hr tablet [id number] Yes Take 2 tablets (50 mg total) by mouth daily. Take with or immediately following a meal. Leeroy Bock, MD Taking Active   nitroGLYCERIN (NITROSTAT) 0.4 MG SL tablet [id number] Yes Place 1 tablet (0.4 mg total) under the tongue every 5 (five) minutes as needed for chest pain. Iran Ouch, MD Taking Active Spouse/Significant Other  Oxycodone HCl 10 MG TABS [id number] Yes Take 1 tablet (10 mg total) by mouth 2 (two) times daily as needed. Jones Bales, NP Taking Active Spouse/Significant Other  pantoprazole (PROTONIX) 40 MG tablet [id number] Yes TAKE ONE TABLET BY MOUTH ONE TIME DAILY Raulkar, Drema Pry, MD Taking Active Spouse/Significant Other  polyethylene glycol (MIRALAX / GLYCOLAX) 17 g packet [id number] Yes Take 17 g by mouth 2 (two) times daily. Leeroy Bock, MD Taking Active   pregabalin (LYRICA) 225 MG capsule [id number] Yes Take 1 capsule (225 mg total) by mouth 2 (two) times daily. Jones Bales, NP Taking Active Spouse/Significant Other  senna (SENOKOT) 8.6 MG TABS tablet [id number] Yes Take 1 tablet (8.6 mg total) by mouth daily. Leeroy Bock, MD Taking Active   Tiotropium Bromide Monohydrate (SPIRIVA RESPIMAT) 2.5 MCG/ACT AERS [id number] Yes Inhale 2 puffs into the lungs daily. Glenford Bayley, NP Taking Active Spouse/Significant Other  tirzepatide [facility]) 7.5 MG/0.5ML Pen [id number] Yes Inject 7.5 mg into the skin once a week. Eden Emms, NP Taking Active Spouse/Significant Other  topiramate (TOPAMAX) 25 MG tablet [id number] Yes Take 1 tablet (25 mg total) by mouth at bedtime. Horton Chin, MD Taking Active  Spouse/Significant Other  [employer] MINI PEN NEEDLES 31G X 6 MM MISC [id number] Yes USE PEN NEEDLES TWO TIMES DAILY Eden Emms, NP Taking Active Spouse/Significant Other            Home Care and Equipment/Supplies: Were Home Health Services Ordered?: NA Any new equipment or medical supplies ordered?: Yes Name of Medical supply agency?: [employer] (they are suppose to deliver oxygen today but she is no sure if they are delivering the nebulizer . RN will f/u with company) Were you able to get the equipment/medical supplies?: No Do you have any questions related to the use of the equipment/supplies?: Yes What questions do you have?: are they bringing the nebulizer  Functional Questionnaire: Do you need assistance with bathing/showering or dressing?: No Do you need assistance with meal preparation?: No Do you need assistance with eating?: No Do you have difficulty maintaining continence: No Do you need assistance with getting out of bed/getting out of a chair/moving?: No Do you have difficulty managing or taking your medications?: No  Follow up appointments reviewed: PCP Follow-up appointment confirmed?: NA Specialist Hospital Follow-up appointment confirmed?: Yes Date of Specialist follow-up appointment?: 06/24/23 Follow-Up Specialty Provider:: [id number] Ames Dura, [id number] Pain management Do you need transportation to your follow-up appointment?: No Do you understand care options if your condition(s) worsen?:  Yes-patient verbalized understanding  SDOH Interventions Today    Flowsheet Row Most Recent Value  SDOH Interventions   Food Insecurity Interventions Intervention Not Indicated  Housing Interventions Intervention Not Indicated  Transportation Interventions Intervention Not Indicated, Patient Resources (Friends/Family)     Per [employer] they are checking with the insurance and will contact the patient regarding the oxygen and the nebulizer.    Gean Maidens BSN  RN [facility] [phone]

## 2023-06-21 ENCOUNTER — Other Ambulatory Visit: Payer: Self-pay | Admitting: Nurse Practitioner

## 2023-06-21 DIAGNOSIS — J449 Chronic obstructive pulmonary disease, unspecified: Secondary | ICD-10-CM | POA: Diagnosis not present

## 2023-06-21 DIAGNOSIS — Z794 Long term (current) use of insulin: Secondary | ICD-10-CM

## 2023-06-21 NOTE — Telephone Encounter (Signed)
LAST APPOINTMENT DATE: 03/05/2023 DM visit   NEXT APPOINTMENT DATE: Visit date not found    LAST REFILL: 06/21/23   QTY: 6 mL 0RF

## 2023-06-22 ENCOUNTER — Other Ambulatory Visit: Payer: Self-pay | Admitting: Registered Nurse

## 2023-06-24 ENCOUNTER — Ambulatory Visit
Admission: RE | Admit: 2023-06-24 | Discharge: 2023-06-24 | Disposition: A | Discharge status: Home / Self Care | Payer: BC Managed Care – PPO | Source: Ambulatory Visit | Attending: Primary Care | Admitting: Primary Care

## 2023-06-24 ENCOUNTER — Other Ambulatory Visit
Admission: RE | Admit: 2023-06-24 | Discharge: 2023-06-24 | Disposition: A | Discharge status: Home / Self Care | Payer: BC Managed Care – PPO | Source: Ambulatory Visit | Attending: Primary Care | Admitting: Primary Care

## 2023-06-24 ENCOUNTER — Ambulatory Visit: Payer: BC Managed Care – PPO | Admitting: Primary Care

## 2023-06-24 ENCOUNTER — Encounter: Payer: Self-pay | Admitting: Primary Care

## 2023-06-24 VITALS — BP 128/78 | HR 72 | Temp 97.8°F | Ht 66.0 in | Wt 192.0 lb

## 2023-06-24 DIAGNOSIS — J439 Emphysema, unspecified: Secondary | ICD-10-CM

## 2023-06-24 DIAGNOSIS — J4489 Other specified chronic obstructive pulmonary disease: Secondary | ICD-10-CM

## 2023-06-24 DIAGNOSIS — J189 Pneumonia, unspecified organism: Secondary | ICD-10-CM

## 2023-06-24 DIAGNOSIS — K2960 Other gastritis without bleeding: Secondary | ICD-10-CM

## 2023-06-24 DIAGNOSIS — G4731 Primary central sleep apnea: Secondary | ICD-10-CM | POA: Diagnosis not present

## 2023-06-24 DIAGNOSIS — J9611 Chronic respiratory failure with hypoxia: Secondary | ICD-10-CM

## 2023-06-24 DIAGNOSIS — R918 Other nonspecific abnormal finding of lung field: Secondary | ICD-10-CM | POA: Diagnosis not present

## 2023-06-24 LAB — BASIC METABOLIC PANEL
Anion gap: 9 (ref 5–15)
BUN: 9 mg/dL (ref 6–20)
CO2: 25 mmol/L (ref 22–32)
Calcium: 9.8 mg/dL (ref 8.9–10.3)
Chloride: 105 mmol/L (ref 98–111)
Creatinine, Ser: 0.94 mg/dL (ref 0.44–1.00)
GFR, Estimated: >60 mL/min (ref >60)
Glucose, Bld: 102 mg/dL — ABNORMAL HIGH (ref 70–99)
Potassium: 4.1 mmol/L (ref 3.5–5.1)
Sodium: 139 mmol/L (ref 135–145)

## 2023-06-24 LAB — CBC WITH DIFFERENTIAL/PLATELET
Abs Immature Granulocytes: 0.02 10*3/uL (ref 0.00–0.07)
Basophils Absolute: 0 10*3/uL (ref 0.0–0.1)
Basophils Relative: 1 %
Eosinophils Absolute: 0.3 10*3/uL (ref 0.0–0.5)
Eosinophils Relative: 4 %
HCT: 35.9 % — ABNORMAL LOW (ref 36.0–46.0)
Hemoglobin: 10.7 g/dL — ABNORMAL LOW (ref 12.0–15.0)
Immature Granulocytes: 0 %
Lymphocytes Relative: 31 %
Lymphs Abs: 2.5 10*3/uL (ref 0.7–4.0)
MCH: 21.3 pg — ABNORMAL LOW (ref 26.0–34.0)
MCHC: 29.8 g/dL — ABNORMAL LOW (ref 30.0–36.0)
MCV: 71.4 fL — ABNORMAL LOW (ref 80.0–100.0)
Monocytes Absolute: 0.4 10*3/uL (ref 0.1–1.0)
Monocytes Relative: 5 %
Neutro Abs: 4.8 10*3/uL (ref 1.7–7.7)
Neutrophils Relative %: 59 %
Platelets: 378 10*3/uL (ref 150–400)
RBC: 5.03 MIL/uL (ref 3.87–5.11)
RDW: 17.9 % — ABNORMAL HIGH (ref 11.5–15.5)
WBC: 8 10*3/uL (ref 4.0–10.5)
nRBC: 0 % (ref 0.0–0.2)

## 2023-06-24 MED ORDER — DM-GUAIFENESIN ER 30-600 MG PO TB12: 1.0000 | ORAL_TABLET | Freq: Two times a day (BID) | ORAL | 1 refills | Status: DC | PRN

## 2023-06-24 MED ORDER — STIOLTO RESPIMAT 2.5-2.5 MCG/ACT IN AERS: 2.0000 | INHALATION_SPRAY | Freq: Every day | RESPIRATORY_TRACT | 0 refills | Status: DC

## 2023-06-24 NOTE — Patient Instructions (Addendum)
Recommendations: Stop Spiriva Start Stiolto Respimat- take two puffs daily Use Albuterol nebulizer every 4-6 hours for shortness for breath/wheezing  Use incentive spirometer hourly Take mucinex-dm twice daily as needed for cough  Start Famotidine 20mg  at bedtime Continue protonix 40mg  daily  Continue 4L oxygen at rest and night; Use 5-6 L with exertion to maintain O2 >88-90%  Continue to sleep with head of bed elevated/ in recliner   Orders: ONO on 4L oxygen CXR (stat priority) and labs today  Please provide patient with 10L concentrator/portable D tanks  Reschedule PFTs in 1 month   Follow-up:  1 month with Dr. Jayme Cloud / PFTs prior

## 2023-06-24 NOTE — Progress Notes (Signed)
Please let patient know WBC and kidney function normal. CXR showed improving pneumonia with scarring. We will get CT chest in 6-8 weeks

## 2023-06-24 NOTE — Progress Notes (Signed)
@Patient  ID: Erika [name], female    DOB: [birth year], 52 y.o.   MRN: [id number]  Chief Complaint  Patient presents with   Follow-up    SOB, dry cough and wheezing.     Referring provider: Eden Emms, NP  HPI: 52 year old ex-smoker referred for evaluation of complex sleep apnea.  She had initially sought evaluation due to witnessed apneas by her husband and loud snoring.  She admits to weight gain from 182 to 225 pounds within a year after her stroke. Epworth sleepiness score is 12 Bedtime around 11 PM, she wakes up around 4 AM when her husband goes to work and finally gets out of bed in a.m. feeling tired without dryness of mouth but reports headaches There is no history suggestive of cataplexy, sleep paralysis or parasomnias   She arrives in a wheelchair today accompanied by her mother and daughter, blood pressure is 86/68 oxygen saturation is 90 percent on room air Reviewed her sleep studies Medication review shows clonazepam for anxiety, Thorazine for sleep, amitriptyline for depression and oxycodone for cluster headaches. She has previously used oxygen for headaches in the past  PMH -  [place] stroke with residual left hemiparesis 06/2021 CABG 07/2016 Diabetes type 2, insulin requiring Peripheral arterial disease Severe anxiety on clonazepam  Previous [facility] pulmonary encounter:  11/27/2022 Patient of Dr. Jayme [name], seen on 09/25/22 for hypoxia. Ordered for ONO and referred to sleep medicine. She saw Dr. Vassie [name] on 10/03/22 for sleep consult d/t complex sleep apnea syndrome. Central apneas are <5/hr so below threshold of significance, could b related to prior hx stroke and also use of sedating medication such as oxycodone, clonazepam and thorazine. She has mild obstructive sleep apnea. She did not tolerate CPAP in the past and during titration study central apneas seemed to emerge and persisted. She is not a candidate for oral appliance. Sleep apnea is not significant enough  for hypoglossal nerve stimulation therapy. Best way to manage is to use nocturnal oxygen and monitor symptoms.   Oxygen saturation was low during her last visit. No evidence of bronchospasm. CXR from 01/2022 did not showed any hyperinflation. Spirometry in the past showed no significant airway obstruction. No significant pulmonary hypertension on echocardiogram. Ordered for PFTs d.t long standing hx smoking. Provided oxygen for bedtime and to use as needed if O2 <88%. Coreg decreased  d/t low blood pressure  She is doing alright today. Accompanied by her husband. She has no acute complaints today. Overnight oximetry test was completed while patient was wearing 2 L of oxygen, advised to increase to 3 L at bedtime.  Will need repeat ONO. Ordered for PFTs but these have not been scheduled. She reports nasal dryness with oxygen use.   05/24/2023 Patient has mild obstructive sleep apnea. She did not tolerate CPAP in the past and during titration study central apneas seemed to emerge and persisted. She is not a candidate for oral appliance. Sleep apnea is not significant enough for hypoglossal nerve stimulation therapy. It was felt the best way to manage mild OSA was to use nocturnal oxygen and monitor symptoms. Spirometry in the past showed no significant airway obstruction. No significant pulmonary hypertension on echocardiogram. Ordered for PFTs d.t long standing hx smoking. Provided oxygen for bedtime and to use as needed if O2 <88%.   She presents today for follow-up. She reports having shortness of breath at rest. She had covid in Feb 2023 but was asymptomatic. CXR at that times showed streraking bilateral lower lung opacity.  She uses 5L oxygen at bedtime. She is not very active but is able to walk to the restroom approx 30-55ft without signfiicant dyspnea. She has a productive cough with clear sputum. She is in a wheelchair today d/t left arm hemiparesis. Denies f/c/s, choking episodes or  aspiration.  Recent cardiac catheterization last year showed patent grafts with no obstructive disease. Continue medical therapy.    Dyspnea - Former smoker. Experiencing more dyspnea at rest. No associated wheezing or cough. She has never had full pulmonary function testing.  Spirometry in the past showed no significant airway obstruction.  Recommend trial Spiriva Respimat 2.5 mcg 2 puffs daily in the morning.  Ordered for PFTs and CT chest without contrast due to abnormal chest x-ray.    Recommendations: Start Spiriva Respimat- take 2 puffs daily in the morning Use Albuterol 2 puffs every 4-6 hours as needed for breakthrough shortness of breath/wheezing Continue to wear oxygen at night and with exertion to maintain O2 >88-90%   Orders: CT chest wo contrast re: abnormal CXR (ordered) Pulmonary function testing re: smoker (ordered)   Follow-up 4-6 weeks with Dr. Jayme [name] or APP (After PFTs and CT chest)  06/24/2023- interim hx  Patient presents today for 1 month follow-up. Former smoker. Followed for complex sleep apnea. Patient was seen in May for routine follow-up and reported increased dyspnea symptoms. Spirometry in the past showed no significant airway obstruction. Started Spiriva Respimat 2.56mcg. Ordered PFTs and CT chest due to abnormal cxr.   CT chest 06/12/23 showed worsening lower lobe consolidation with some subtly areas in middle lobe and upper lobe dependently. Continue follow-up  Admitted on 06/12/23 for community acquired pneumonia. Completed keflex and azithromycin course.  She is feeling better has a deep cough which is not currently productive. Not taking anything for cough. Shortness of breath is baseline. Using Spiriva in the morning since last visit. She has nebulizer which she is using every 4 hours. O2 using 2-4L, using tanks. Wanting to change DME companies to [employer]. No fevers.   Repeat CXR today showed interval improved aeration of the right lung base. Persistent  medial right lower lobe opacity which has been present on multiple priors and is likely due to scarring. Follow-up chest CT could be performed for better evaluation.  Significant tests/ events reviewed 07/11/22 Baseline study showed few central apneas 4/hour and total AHI 12/hour., low sat of 82%    07/2022 CPAP was titrated 5 to 13 cm, central apneas emerged and persisted  Echo 2/203 nml LVEF     Allergies  Allergen Reactions   Chantix [Varenicline Tartrate] Other (See Comments)    Feels "crazy" and angry     Immunization History  Administered Date(s) Administered   Influenza Split 09/29/2012   Influenza,inj,Quad PF,6+ Mos 10/17/2015, 09/03/2016, 09/17/2017, 09/24/2018, 09/07/2019, 03/01/2022, 11/30/2022   PNEUMOCOCCAL CONJUGATE-20 11/30/2022   Pneumococcal Polysaccharide-23 12/26/2017   Td 07/26/2010   Tdap 03/01/2022    Past Medical History:  Diagnosis Date   Acute respiratory failure with hypoxia (HCC) 06/24/2021   Arthralgia of temporomandibular joint    CAD, multiple vessel    a. 06/2016 Cath: ostLM 40%, ostLAD 40%, pLAD 95%, ost-pLCx 60%, pLCx 95%, mLCx 60%, mRCA 95%, D2 50%, LVSF nl;  b. 07/2016 CABG x 4 (LIMA->LAD, VG->Diag, VG->OM, VG->RCA); c. 08/2016 Cath: 3VD w/ 4/4 patent grafts. LAD distal to LIMA has diff dzs->Med rx; d. 08/2020 Cath: 4/4 patent grafts, native 3VD. EF 55-65%-->Med Rx.   Carotid arterial disease (HCC)    a.  07/2016 s/p R CEA; b. 02/2021 U/S: RICA 40-59%, LICA 1-39%.   Cerebrovascular disease    Clotting disorder (HCC)    Complex sleep apnea syndrome 10/03/2022   Depression    Diastolic dysfunction    a. 06/2016 Echo: EF 50-55%, mild inf wall HK, GR1DD, mild MR, RV sys fxn nl, mildly dilated LA, PASP nl; b. 06/2021 Echo: EF 60-65%, no rwma. Nl RV fxn.   Fatty liver disease, nonalcoholic 2016   History of blood transfusion    with heart surgery   HLD (hyperlipidemia)    Labile hypertension    a. prior renal ngiogram negative for RAS in 03/2016; b.  catecholamines and metanephrines normal, mildly elevated renin with normal aldosterone and normal ratio in 02/2016   Myocardial infarction [facility]) 2017   Obesity    PAD (peripheral artery disease) (HCC)    a. 09/2018 s/p L SFA stenting; b. 07/2019 Periph Angio: Patent m/d L SFA stent w/ 100% L SFA distal to stent. L AT 100d, L Peroneal diff dzs-->Med Rx; c. 02/2021 ABIs: stable @ 0.61 on R and 0.46 on L.   PTSD (post-traumatic stress disorder)    Stroke (HCC)    Tobacco abuse    Type 2 diabetes mellitus (HCC) 12/2015    Tobacco History: Social History   Tobacco Use  Smoking Status Former   Packs/day: 0.50   Years: 30.00   Additional pack years: 0.00   Total pack years: 15.00   Types: Cigarettes   Quit date: 2022   Years since quitting: 2.5   Passive exposure: Never  Smokeless Tobacco Never   Counseling given: Not Answered   Outpatient Medications Prior to Visit  Medication Sig Dispense Refill   acetaminophen (TYLENOL) 325 MG tablet Take 1-2 tablets (325-650 mg total) by mouth every 4 (four) hours as needed for mild pain.     albuterol (PROVENTIL) (2.5 MG/3ML) 0.083% nebulizer solution Take 3 mLs (2.5 mg total) by nebulization every 4 (four) hours as needed for wheezing or shortness of breath. 75 mL 0   albuterol (VENTOLIN HFA) 108 (90 Base) MCG/ACT inhaler Inhale 2 puffs into the lungs every 6 (six) hours as needed for wheezing. 18 g 0   aspirin EC 81 MG tablet Take 1 tablet (81 mg total) by mouth daily. Swallow whole. 90 tablet 0   chlorproMAZINE (THORAZINE) 25 MG tablet Take 3 tablets (75 mg total) by mouth at bedtime. 90 tablet 1   clonazePAM (KLONOPIN) 0.5 MG tablet Take 0.5 tablets (0.25 mg total) by mouth 2 (two) times daily. Take 1/2 tab twice daily. No dissolving tablets.     clopidogrel (PLAVIX) 75 MG tablet Take 1 tablet (75 mg total) by mouth daily. 90 tablet 3   Continuous Glucose Sensor (DEXCOM G7 SENSOR) MISC APPLY ONE SENSOR TO THE BACK OF YOUR UPPER ARM. REPLACE EVERY  10 DAYS. 3 each 5   escitalopram (LEXAPRO) 20 MG tablet Take 1 tablet (20 mg total) by mouth daily. 30 tablet 1   Evolocumab (REPATHA SURECLICK) 140 MG/ML SOAJ Inject 140 mg into the skin as directed. Inject 1 Dose into the skin every 14 (fourteen) days. 6 mL 3   icosapent Ethyl (VASCEPA) 1 g capsule Take 2 g by mouth 2 (two) times daily.     insulin detemir (LEVEMIR) 100 UNIT/ML injection Inject 0.27 mLs (27 Units total) into the skin daily. 10 mL 1   lamoTRIgine (LAMICTAL) 200 MG tablet Take 1 tablet (200 mg total) by mouth at bedtime. 30 tablet  1   lamoTRIgine (LAMICTAL) 25 MG tablet Take 1 tablet (25 mg total) by mouth daily. 30 tablet 1   metoprolol succinate (TOPROL-XL) 25 MG 24 hr tablet Take 2 tablets (50 mg total) by mouth daily. Take with or immediately following a meal. 90 tablet 0   MOUNJARO 7.5 MG/0.5ML Pen INJECT THE CONTENTS OF ONE PEN UNDER THE SKIN WEEKLY ON THE SAME DAY EACH WEEK 6 mL 0   nitroGLYCERIN (NITROSTAT) 0.4 MG SL tablet Place 1 tablet (0.4 mg total) under the tongue every 5 (five) minutes as needed for chest pain. 25 tablet 1   Oxycodone HCl 10 MG TABS Take 1 tablet (10 mg total) by mouth 2 (two) times daily as needed. 60 tablet 0   pantoprazole (PROTONIX) 40 MG tablet TAKE ONE TABLET BY MOUTH ONE TIME DAILY 30 tablet 0   polyethylene glycol (MIRALAX / GLYCOLAX) 17 g packet Take 17 g by mouth 2 (two) times daily. 60 packet 0   pregabalin (LYRICA) 225 MG capsule Take 1 capsule (225 mg total) by mouth 2 (two) times daily. 60 capsule 3   senna (SENOKOT) 8.6 MG TABS tablet Take 1 tablet (8.6 mg total) by mouth daily. 30 tablet 0   topiramate (TOPAMAX) 25 MG tablet Take 1 tablet (25 mg total) by mouth at bedtime. 90 tablet 3   ULTICARE MINI PEN NEEDLES 31G X 6 MM MISC USE PEN NEEDLES TWO TIMES DAILY 100 each 3   cyclobenzaprine (FLEXERIL) 10 MG tablet TAKE ONE TABLET BY MOUTH AT BEDTIME 30 tablet 3   Tiotropium Bromide Monohydrate (SPIRIVA RESPIMAT) 2.5 MCG/ACT AERS Inhale 2  puffs into the lungs daily. 8 g 0   No facility-administered medications prior to visit.      Review of Systems  Review of Systems  Constitutional:  Negative for fever.  HENT:  Positive for congestion.   Respiratory:  Positive for cough and shortness of breath. Negative for chest tightness, wheezing and stridor.   Cardiovascular: Negative.    Physical Exam  BP 128/78 (BP Location: Right Arm, Cuff Size: Normal)   Pulse 72   Temp 97.8 F (36.6 C) (Temporal)   Ht 5\' 6"  (1.676 m)   Wt 192 lb (87.1 kg)   SpO2 90%   BMI 30.99 kg/m  Physical Exam Constitutional:      Appearance: Normal appearance.  HENT:     Mouth/Throat:     Mouth: Mucous membranes are moist.     Pharynx: Oropharynx is clear.  Cardiovascular:     Rate and Rhythm: Normal rate.  Pulmonary:     Effort: Pulmonary effort is normal.     Breath sounds: Rales present.     Comments: Fine rales left base; O2 92% 4l  Musculoskeletal:        General: Normal range of motion.  Skin:    General: Skin is warm.  Neurological:     General: No focal deficit present.     Mental Status: She is alert and oriented to person, place, and time. Mental status is at baseline.  Psychiatric:        Mood and Affect: Mood normal.        Behavior: Behavior normal.        Thought Content: Thought content normal.        Judgment: Judgment normal.      Lab Results:  CBC    Component Value Date/Time   WBC 8.0 06/24/2023 1646   RBC 5.03 06/24/2023 1646   HGB  10.7 (L) 06/24/2023 1646   HGB 14.2 09/11/2021 1413   HCT 35.9 (L) 06/24/2023 1646   HCT 43.5 09/11/2021 1413   PLT 378 06/24/2023 1646   PLT 433 09/11/2021 1413   MCV 71.4 (L) 06/24/2023 1646   MCV 89 09/11/2021 1413   MCV 89 01/11/2015 1022   MCH 21.3 (L) 06/24/2023 1646   MCHC 29.8 (L) 06/24/2023 1646   RDW 17.9 (H) 06/24/2023 1646   RDW 12.8 09/11/2021 1413   RDW 13.8 01/11/2015 1022   LYMPHSABS 2.5 06/24/2023 1646   LYMPHSABS 2.6 09/11/2021 1413   LYMPHSABS  3.4 01/11/2014 1312   MONOABS 0.4 06/24/2023 1646   MONOABS 0.7 01/11/2014 1312   EOSABS 0.3 06/24/2023 1646   EOSABS 0.2 09/11/2021 1413   EOSABS 0.3 01/11/2014 1312   BASOSABS 0.0 06/24/2023 1646   BASOSABS 0.0 09/11/2021 1413   BASOSABS 0.1 01/11/2014 1312    BMET    Component Value Date/Time   NA 139 06/24/2023 1646   NA 138 09/11/2021 1413   NA 135 (L) 01/11/2015 1022   K 4.1 06/24/2023 1646   K 3.9 01/11/2015 1022   CL 105 06/24/2023 1646   CL 100 01/11/2015 1022   CO2 25 06/24/2023 1646   CO2 27 01/11/2015 1022   GLUCOSE 102 (H) 06/24/2023 1646   GLUCOSE 183 (H) 01/11/2015 1022   BUN 9 06/24/2023 1646   BUN 10 09/11/2021 1413   BUN 6 (L) 01/11/2015 1022   CREATININE 0.94 06/24/2023 1646   CREATININE 0.81 11/30/2022 1452   CALCIUM 9.8 06/24/2023 1646   CALCIUM 10.0 01/11/2015 1022   GFRNONAA >60 06/24/2023 1646   GFRNONAA >60 01/11/2015 1022   GFRNONAA 41 (L) 08/10/2014 1459   GFRAA 106 01/12/2021 0934   GFRAA >60 01/11/2015 1022   GFRAA 48 (L) 08/10/2014 1459    BNP    Component Value Date/Time   BNP 17.3 06/12/2023 1229    ProBNP No results found for: "PROBNP"  Imaging: DG Chest 2 View  Result Date: 06/24/2023 CLINICAL DATA:  Follow-up pneumonia EXAM: CHEST - 2 VIEW COMPARISON:  Chest x-ray dated June 12, 2023 FINDINGS: Cardiac and mediastinal contours are unchanged. Prior median sternotomy with unchanged fractures of the superior wires. Interval improved aeration of the right lung base. Medial right lower lobe opacity has been present on multiple priors and is likely due to scarring. No evidence of pleural effusion or pneumothorax. IMPRESSION: 1. Interval improved aeration of the right lung base. 2. Persistent medial right lower lobe opacity which has been present on multiple priors and is likely due to scarring. Follow-up chest CT could be performed for better evaluation. Electronically Signed   By: Allegra Lai M.D.   On: 06/24/2023 17:20   CT CHEST  WO CONTRAST  Result Date: 06/12/2023 CLINICAL DATA:  Respiratory illness.  Shortness of breath EXAM: CT CHEST WITHOUT CONTRAST TECHNIQUE: Multidetector CT imaging of the chest was performed following the standard protocol without IV contrast. RADIATION DOSE REDUCTION: This exam was performed according to the departmental dose-optimization program which includes automated exposure control, adjustment of the mA and/or kV according to patient size and/or use of iterative reconstruction technique. COMPARISON:  X-ray earlier 06/12/2023.  CT 05/29/2023 FINDINGS: Cardiovascular: Status post median sternotomy. Coronary artery calcifications are seen. The thoracic aorta has a overall normal course and caliber with calcified plaque. Surgical changes along the ascending aorta. There is a bovine type aortic arch, normal variant. The left vertebral artery also originates directly from the  aortic arch proximal to the left subclavian artery origin, also a variant. No significant pericardial effusion. Mediastinum/Nodes: Slightly patulous thoracic esophagus. On this non IV contrast exam there is no specific abnormal lymph node enlargement identified in the axillary region, hilum or mediastinum. There are some small nodes identified in the mediastinum, similar to previous. Largest is again seen subcarinal were previous study had short axis dimension approaching 16 mm and today similar on series 2, image 70. Lungs/Pleura: Worsening consolidative opacities along both lower lobes. Few patchy areas of opacity as well with some reticular changes involving the dependent right upper lobe and middle lobe. No pneumothorax or significant effusion. Significant breathing motion Upper Abdomen: Adrenal glands are preserved in the upper abdomen. Few prominent upper abdominal nodes are also seen, unchanged from previous. Again measuring for example 9 mm in short axis on series 2, image 127. Musculoskeletal: Scattered degenerative changes are seen  along the spine. IMPRESSION: Worsening lower lobe consolidation with some subtle areas in the middle lobe and upper lobe dependently. No significant pleural effusion. Recommend continued follow-up. Stable borderline and mildly enlarged mediastinal and upper abdominal nodes. Postop chest. Aortic Atherosclerosis (ICD10-I70.0). Electronically Signed   By: Karen Kays M.D.   On: 06/12/2023 16:14   DG Chest Port 1 View  Result Date: 06/12/2023 CLINICAL DATA:  Provided history: Questionable sepsis-evaluate for abnormality. EXAM: PORTABLE CHEST 1 VIEW COMPARISON:  Chest CT 05/29/2023. Prior chest radiographs 03/05/2023 and earlier. FINDINGS: Prior median sternotomy/CABG. As before, there are fractures within upper sternal cerclage wires. Mild cardiomegaly. Persistent ill-defined opacity within the right lung base. No appreciable airspace consolidation on the left. No evidence of pleural effusion or pneumothorax. No acute osseous abnormality identified. IMPRESSION: Persistent ill-defined opacity within the right lung base compatible with pneumonia. Radiographic follow-up to complete resolution recommended to exclude underlying malignancy. Electronically Signed   By: Jackey Loge D.O.   On: 06/12/2023 13:41   CT Head Wo Contrast  Result Date: 06/11/2023 CLINICAL DATA:  Blunt polytrauma.  Patient from home after a fall. EXAM: CT HEAD WITHOUT CONTRAST CT CERVICAL SPINE WITHOUT CONTRAST TECHNIQUE: Multidetector CT imaging of the head and cervical spine was performed following the standard protocol without intravenous contrast. Multiplanar CT image reconstructions of the cervical spine were also generated. RADIATION DOSE REDUCTION: This exam was performed according to the departmental dose-optimization program which includes automated exposure control, adjustment of the mA and/or kV according to patient size and/or use of iterative reconstruction technique. COMPARISON:  MRI examination dated January 26, 2022 FINDINGS:  CT HEAD FINDINGS Brain: No evidence of acute infarction, hemorrhage, hydrocephalus, extra-axial collection or mass lesion/mass effect. Encephalomalacia of the right posterior parietal and occipital lobe suggesting chronic infarct. Vascular: No hyperdense vessel or unexpected calcification. Skull: Large left posterior parietal/occipital scalp hematoma without evidence of calvarial fracture. Sinuses/Orbits: No acute finding. Other: None. CT CERVICAL SPINE FINDINGS Alignment: Straightening of the cervical spine. Skull base and vertebrae: No acute fracture. No primary bone lesion or focal pathologic process. Soft tissues and spinal canal: No prevertebral fluid or swelling. No visible canal hematoma. Disc levels: Mild multilevel degenerate disc disease. No significant spinal canal or neural foraminal stenosis. Upper chest: Negative. Other: None IMPRESSION: CT HEAD: 1. No acute intracranial abnormality. 2. Large left posterior parietal/occipital scalp hematoma without evidence of calvarial fracture. 3. Encephalomalacia of the right posterior parietal and occipital lobe suggesting chronic infarct. CT CERVICAL SPINE: 1. No acute fracture or traumatic subluxation. 2. Mild multilevel degenerate disc disease. Electronically Signed   By:  Imran  Ahmed D.O.   On: 06/11/2023 14:03   CT Cervical Spine Wo Contrast  Result Date: 06/11/2023 CLINICAL DATA:  Blunt polytrauma.  Patient from home after a fall. EXAM: CT HEAD WITHOUT CONTRAST CT CERVICAL SPINE WITHOUT CONTRAST TECHNIQUE: Multidetector CT imaging of the head and cervical spine was performed following the standard protocol without intravenous contrast. Multiplanar CT image reconstructions of the cervical spine were also generated. RADIATION DOSE REDUCTION: This exam was performed according to the departmental dose-optimization program which includes automated exposure control, adjustment of the mA and/or kV according to patient size and/or use of iterative reconstruction  technique. COMPARISON:  MRI examination dated January 26, 2022 FINDINGS: CT HEAD FINDINGS Brain: No evidence of acute infarction, hemorrhage, hydrocephalus, extra-axial collection or mass lesion/mass effect. Encephalomalacia of the right posterior parietal and occipital lobe suggesting chronic infarct. Vascular: No hyperdense vessel or unexpected calcification. Skull: Large left posterior parietal/occipital scalp hematoma without evidence of calvarial fracture. Sinuses/Orbits: No acute finding. Other: None. CT CERVICAL SPINE FINDINGS Alignment: Straightening of the cervical spine. Skull base and vertebrae: No acute fracture. No primary bone lesion or focal pathologic process. Soft tissues and spinal canal: No prevertebral fluid or swelling. No visible canal hematoma. Disc levels: Mild multilevel degenerate disc disease. No significant spinal canal or neural foraminal stenosis. Upper chest: Negative. Other: None IMPRESSION: CT HEAD: 1. No acute intracranial abnormality. 2. Large left posterior parietal/occipital scalp hematoma without evidence of calvarial fracture. 3. Encephalomalacia of the right posterior parietal and occipital lobe suggesting chronic infarct. CT CERVICAL SPINE: 1. No acute fracture or traumatic subluxation. 2. Mild multilevel degenerate disc disease. Electronically Signed   By: Larose Hires D.O.   On: 06/11/2023 14:03     Assessment & Plan:   Complex sleep apnea syndrome Predominantly obstructive with few central apneas. OSA is mild, she did not tolerate CPAP and is not a candidate for oral appliance or hypoglossal nerve stimulator. Continue nocturnal oxygen and monitor symptoms. We need to re-address BIPAP use or TRILOGY at subsequent follow-up. Patient at this time does not want to purse.   COPD with chronic bronchitis and emphysema (HCC) Former smoker with presumed COPD. Baseline dyspnea and cough. Uses oxygen at bedtime. Needs PFTs to assess degree of obstructive lung disease.    Recommendations: Stop Spiriva; start Stiolto Respimat- take two puffs daily Use Albuterol nebulizer every 4-6 hours for shortness for breath/wheezing  Use incentive spirometer hourly Take mucinex-dm twice daily as needed for cough   Community acquired pneumonia of right lower lobe of lung - Hx recurrent multifocal pneumonia. She had a swallow study in 2022 that showed mild aspiration risk. Hospitalized in June 2024 requiring BIPAP. Treated with course of Azithromycin and Keflex. Clinically she is feeling some better but not 100 percent. Coughing less and remains afebrile  - CT chest 06/12/23 showed worsening lower lobe consolidation with some subtly areas in middle lobe and upper lobe dependently. Continue follow-up - CXR today showed interval improved aeration RIGHT lung base, persistent medial right lower opacity which could reflect scarring. Plan repeat CT imaging in 4-6 weeks to follow-up    Chronic respiratory failure with hypoxia (HCC) - O2 dropped to 81% with exertion on 4L, requiring 6L with exertion to maintain >90%  - Needs overnight oximetry on 4L oxygen at bedtime  - Changing DME companies to [employer], needs 10L concentrator   Reflux gastritis Patient is having breakthrough reflux symptoms Start Famotidine 20mg  at bedtime; continue protonix 40mg  daily  Glenford Bayley, NP 07/01/2023

## 2023-06-25 ENCOUNTER — Telehealth: Payer: Self-pay

## 2023-06-25 NOTE — Telephone Encounter (Signed)
Please let patient know WBC and kidney function normal. CXR showed improving pneumonia with scarring. We will get CT chest in 6-8 weeks    Patient is aware of results and voiced her understanding.  Nothing further needed.

## 2023-06-25 NOTE — Telephone Encounter (Signed)
Patient is returning phone call. Patient phone number is [phone].

## 2023-06-25 NOTE — Telephone Encounter (Signed)
Erika [name] ordered PFT- patient is concerned about getting into PFT booth due to physical limitations.   Lm for [employer].

## 2023-06-26 ENCOUNTER — Telehealth: Payer: Self-pay | Admitting: Registered Nurse

## 2023-06-26 DIAGNOSIS — G43909 Migraine, unspecified, not intractable, without status migrainosus: Secondary | ICD-10-CM | POA: Diagnosis not present

## 2023-06-26 DIAGNOSIS — G44009 Cluster headache syndrome, unspecified, not intractable: Secondary | ICD-10-CM | POA: Diagnosis not present

## 2023-06-26 DIAGNOSIS — M542 Cervicalgia: Secondary | ICD-10-CM | POA: Diagnosis not present

## 2023-06-26 NOTE — Telephone Encounter (Signed)
Flexeril was ordered by Dr Marijean Niemann

## 2023-06-26 NOTE — Telephone Encounter (Signed)
Dr Carlis Abbott sent the Flexeril order today.

## 2023-07-01 ENCOUNTER — Other Ambulatory Visit (HOSPITAL_COMMUNITY): Payer: Self-pay | Admitting: Psychiatry

## 2023-07-01 DIAGNOSIS — F331 Major depressive disorder, recurrent, moderate: Secondary | ICD-10-CM

## 2023-07-01 DIAGNOSIS — J439 Emphysema, unspecified: Secondary | ICD-10-CM | POA: Insufficient documentation

## 2023-07-01 DIAGNOSIS — F4310 Post-traumatic stress disorder, unspecified: Secondary | ICD-10-CM

## 2023-07-01 DIAGNOSIS — J9611 Chronic respiratory failure with hypoxia: Secondary | ICD-10-CM | POA: Diagnosis not present

## 2023-07-01 DIAGNOSIS — K2960 Other gastritis without bleeding: Secondary | ICD-10-CM

## 2023-07-01 HISTORY — DX: Other gastritis without bleeding: K29.60

## 2023-07-01 NOTE — Assessment & Plan Note (Addendum)
Patient is having breakthrough reflux symptoms Start Famotidine 20mg  at bedtime; continue protonix 40mg  daily

## 2023-07-01 NOTE — Assessment & Plan Note (Addendum)
-   Hx recurrent multifocal pneumonia. She had a swallow study in 2022 that showed mild aspiration risk. Hospitalized in June 2024 requiring BIPAP. Treated with course of Azithromycin and Keflex. Clinically she is feeling some better but not 100 percent. Coughing less and remains afebrile  - CT chest 06/12/23 showed worsening lower lobe consolidation with some subtly areas in middle lobe and upper lobe dependently. Continue follow-up - CXR today showed interval improved aeration RIGHT lung base, persistent medial right lower opacity which could reflect scarring. Plan repeat CT imaging in 4-6 weeks to follow-up

## 2023-07-01 NOTE — Assessment & Plan Note (Addendum)
-   O2 dropped to 81% with exertion on 4L, requiring 6L with exertion to maintain >90%  - Needs overnight oximetry on 4L oxygen at bedtime  - Changing DME companies to [place] [employer], needs 10L concentrator

## 2023-07-01 NOTE — Assessment & Plan Note (Addendum)
Predominantly obstructive with few central apneas. OSA is mild, she did not tolerate CPAP and is not a candidate for oral appliance or hypoglossal nerve stimulator. Continue nocturnal oxygen and monitor symptoms. We need to re-address BIPAP use or TRILOGY at subsequent follow-up. Patient at this time does not want to purse.

## 2023-07-01 NOTE — Progress Notes (Signed)
Agree with the details of the visit as noted by Elizabeth Walsh, NP.  C. Laura Bonney Berres, MD [facility] 

## 2023-07-01 NOTE — Assessment & Plan Note (Addendum)
Former smoker with presumed COPD. Baseline dyspnea and cough. Uses oxygen at bedtime. Needs PFTs to assess degree of obstructive lung disease.   Recommendations: Stop Spiriva; start Stiolto Respimat- take two puffs daily Use Albuterol nebulizer every 4-6 hours for shortness for breath/wheezing  Use incentive spirometer hourly Take mucinex-dm twice daily as needed for cough

## 2023-07-03 ENCOUNTER — Encounter: Payer: Self-pay | Admitting: Family Medicine

## 2023-07-03 ENCOUNTER — Other Ambulatory Visit: Payer: Self-pay

## 2023-07-03 ENCOUNTER — Inpatient Hospital Stay: Payer: BC Managed Care – PPO

## 2023-07-03 ENCOUNTER — Inpatient Hospital Stay
Admission: EM | Admit: 2023-07-03 | Discharge: 2023-07-05 | DRG: 189 | Disposition: A | Discharge status: Home / Self Care | Payer: BC Managed Care – PPO | Attending: Internal Medicine | Admitting: Internal Medicine

## 2023-07-03 ENCOUNTER — Emergency Department: Payer: BC Managed Care – PPO

## 2023-07-03 DIAGNOSIS — Z7902 Long term (current) use of antithrombotics/antiplatelets: Secondary | ICD-10-CM

## 2023-07-03 DIAGNOSIS — E1169 Type 2 diabetes mellitus with other specified complication: Secondary | ICD-10-CM

## 2023-07-03 DIAGNOSIS — I959 Hypotension, unspecified: Secondary | ICD-10-CM | POA: Diagnosis not present

## 2023-07-03 DIAGNOSIS — Z87891 Personal history of nicotine dependence: Secondary | ICD-10-CM

## 2023-07-03 DIAGNOSIS — I5030 Unspecified diastolic (congestive) heart failure: Secondary | ICD-10-CM

## 2023-07-03 DIAGNOSIS — I2510 Atherosclerotic heart disease of native coronary artery without angina pectoris: Secondary | ICD-10-CM | POA: Diagnosis not present

## 2023-07-03 DIAGNOSIS — D638 Anemia in other chronic diseases classified elsewhere: Secondary | ICD-10-CM | POA: Diagnosis present

## 2023-07-03 DIAGNOSIS — Z813 Family history of other psychoactive substance abuse and dependence: Secondary | ICD-10-CM

## 2023-07-03 DIAGNOSIS — J690 Pneumonitis due to inhalation of food and vomit: Secondary | ICD-10-CM | POA: Diagnosis present

## 2023-07-03 DIAGNOSIS — J189 Pneumonia, unspecified organism: Secondary | ICD-10-CM

## 2023-07-03 DIAGNOSIS — Z95820 Peripheral vascular angioplasty status with implants and grafts: Secondary | ICD-10-CM | POA: Diagnosis not present

## 2023-07-03 DIAGNOSIS — Z951 Presence of aortocoronary bypass graft: Secondary | ICD-10-CM

## 2023-07-03 DIAGNOSIS — Z9981 Dependence on supplemental oxygen: Secondary | ICD-10-CM | POA: Diagnosis not present

## 2023-07-03 DIAGNOSIS — D649 Anemia, unspecified: Secondary | ICD-10-CM

## 2023-07-03 DIAGNOSIS — I5032 Chronic diastolic (congestive) heart failure: Secondary | ICD-10-CM | POA: Diagnosis present

## 2023-07-03 DIAGNOSIS — R55 Syncope and collapse: Secondary | ICD-10-CM | POA: Diagnosis not present

## 2023-07-03 DIAGNOSIS — I951 Orthostatic hypotension: Secondary | ICD-10-CM | POA: Diagnosis present

## 2023-07-03 DIAGNOSIS — R0602 Shortness of breath: Secondary | ICD-10-CM | POA: Diagnosis not present

## 2023-07-03 DIAGNOSIS — Z794 Long term (current) use of insulin: Secondary | ICD-10-CM | POA: Diagnosis not present

## 2023-07-03 DIAGNOSIS — R918 Other nonspecific abnormal finding of lung field: Secondary | ICD-10-CM | POA: Diagnosis not present

## 2023-07-03 DIAGNOSIS — J9621 Acute and chronic respiratory failure with hypoxia: Secondary | ICD-10-CM | POA: Diagnosis not present

## 2023-07-03 DIAGNOSIS — K760 Fatty (change of) liver, not elsewhere classified: Secondary | ICD-10-CM | POA: Diagnosis present

## 2023-07-03 DIAGNOSIS — E785 Hyperlipidemia, unspecified: Secondary | ICD-10-CM | POA: Diagnosis present

## 2023-07-03 DIAGNOSIS — Z7985 Long-term (current) use of injectable non-insulin antidiabetic drugs: Secondary | ICD-10-CM | POA: Diagnosis not present

## 2023-07-03 DIAGNOSIS — Z8701 Personal history of pneumonia (recurrent): Secondary | ICD-10-CM

## 2023-07-03 DIAGNOSIS — R1310 Dysphagia, unspecified: Secondary | ICD-10-CM | POA: Diagnosis present

## 2023-07-03 DIAGNOSIS — E1143 Type 2 diabetes mellitus with diabetic autonomic (poly)neuropathy: Secondary | ICD-10-CM | POA: Diagnosis not present

## 2023-07-03 DIAGNOSIS — Z823 Family history of stroke: Secondary | ICD-10-CM

## 2023-07-03 DIAGNOSIS — Z8673 Personal history of transient ischemic attack (TIA), and cerebral infarction without residual deficits: Secondary | ICD-10-CM

## 2023-07-03 DIAGNOSIS — I252 Old myocardial infarction: Secondary | ICD-10-CM

## 2023-07-03 DIAGNOSIS — G9389 Other specified disorders of brain: Secondary | ICD-10-CM | POA: Diagnosis not present

## 2023-07-03 DIAGNOSIS — I110 Hypertensive heart disease with heart failure: Secondary | ICD-10-CM | POA: Diagnosis present

## 2023-07-03 DIAGNOSIS — Z79899 Other long term (current) drug therapy: Secondary | ICD-10-CM

## 2023-07-03 DIAGNOSIS — Z818 Family history of other mental and behavioral disorders: Secondary | ICD-10-CM

## 2023-07-03 DIAGNOSIS — I69354 Hemiplegia and hemiparesis following cerebral infarction affecting left non-dominant side: Secondary | ICD-10-CM | POA: Diagnosis not present

## 2023-07-03 DIAGNOSIS — Z7982 Long term (current) use of aspirin: Secondary | ICD-10-CM

## 2023-07-03 DIAGNOSIS — Z8249 Family history of ischemic heart disease and other diseases of the circulatory system: Secondary | ICD-10-CM

## 2023-07-03 DIAGNOSIS — E1151 Type 2 diabetes mellitus with diabetic peripheral angiopathy without gangrene: Secondary | ICD-10-CM | POA: Diagnosis not present

## 2023-07-03 DIAGNOSIS — G3189 Other specified degenerative diseases of nervous system: Secondary | ICD-10-CM | POA: Diagnosis not present

## 2023-07-03 DIAGNOSIS — G4733 Obstructive sleep apnea (adult) (pediatric): Secondary | ICD-10-CM | POA: Diagnosis not present

## 2023-07-03 DIAGNOSIS — Z833 Family history of diabetes mellitus: Secondary | ICD-10-CM

## 2023-07-03 DIAGNOSIS — F4310 Post-traumatic stress disorder, unspecified: Secondary | ICD-10-CM | POA: Diagnosis not present

## 2023-07-03 DIAGNOSIS — J449 Chronic obstructive pulmonary disease, unspecified: Secondary | ICD-10-CM | POA: Diagnosis not present

## 2023-07-03 DIAGNOSIS — Z9049 Acquired absence of other specified parts of digestive tract: Secondary | ICD-10-CM

## 2023-07-03 DIAGNOSIS — R0902 Hypoxemia: Secondary | ICD-10-CM | POA: Diagnosis not present

## 2023-07-03 DIAGNOSIS — R000 Tachycardia, unspecified: Secondary | ICD-10-CM | POA: Diagnosis not present

## 2023-07-03 DIAGNOSIS — Z811 Family history of alcohol abuse and dependence: Secondary | ICD-10-CM

## 2023-07-03 DIAGNOSIS — I6381 Other cerebral infarction due to occlusion or stenosis of small artery: Secondary | ICD-10-CM | POA: Diagnosis not present

## 2023-07-03 DIAGNOSIS — T17908A Unspecified foreign body in respiratory tract, part unspecified causing other injury, initial encounter: Principal | ICD-10-CM

## 2023-07-03 LAB — URINALYSIS, W/ REFLEX TO CULTURE (INFECTION SUSPECTED)
Bacteria, UA: NONE SEEN
Bilirubin Urine: NEGATIVE
Glucose, UA: NEGATIVE mg/dL
Hgb urine dipstick: NEGATIVE
Ketones, ur: NEGATIVE mg/dL
Leukocytes,Ua: NEGATIVE
Nitrite: NEGATIVE
Protein, ur: NEGATIVE mg/dL
Specific Gravity, Urine: 1.035 — ABNORMAL HIGH (ref 1.005–1.030)
pH: 7 (ref 5.0–8.0)

## 2023-07-03 LAB — COMPREHENSIVE METABOLIC PANEL
ALT: 8 U/L (ref 0–44)
AST: 15 U/L (ref 15–41)
Albumin: 3.1 g/dL — ABNORMAL LOW (ref 3.5–5.0)
Alkaline Phosphatase: 57 U/L (ref 38–126)
Anion gap: 10 (ref 5–15)
BUN: 8 mg/dL (ref 6–20)
CO2: 25 mmol/L (ref 22–32)
Calcium: 9.3 mg/dL (ref 8.9–10.3)
Chloride: 105 mmol/L (ref 98–111)
Creatinine, Ser: 0.92 mg/dL (ref 0.44–1.00)
GFR, Estimated: >60 mL/min (ref >60)
Glucose, Bld: 130 mg/dL — ABNORMAL HIGH (ref 70–99)
Potassium: 3.5 mmol/L (ref 3.5–5.1)
Sodium: 140 mmol/L (ref 135–145)
Total Bilirubin: <0.1 mg/dL — ABNORMAL LOW (ref 0.3–1.2)
Total Protein: 6.6 g/dL (ref 6.5–8.1)

## 2023-07-03 LAB — PROTIME-INR
INR: 1.1 (ref 0.8–1.2)
Prothrombin Time: 14.2 seconds (ref 11.4–15.2)

## 2023-07-03 LAB — APTT: aPTT: 35 seconds (ref 24–36)

## 2023-07-03 LAB — CBC WITH DIFFERENTIAL/PLATELET
Abs Immature Granulocytes: 0.16 10*3/uL — ABNORMAL HIGH (ref 0.00–0.07)
Basophils Absolute: 0.1 10*3/uL (ref 0.0–0.1)
Basophils Relative: 0 %
Eosinophils Absolute: 0.2 10*3/uL (ref 0.0–0.5)
Eosinophils Relative: 2 %
HCT: 28.7 % — ABNORMAL LOW (ref 36.0–46.0)
Hemoglobin: 8.5 g/dL — ABNORMAL LOW (ref 12.0–15.0)
Immature Granulocytes: 1 %
Lymphocytes Relative: 17 %
Lymphs Abs: 2.3 10*3/uL (ref 0.7–4.0)
MCH: 21.1 pg — ABNORMAL LOW (ref 26.0–34.0)
MCHC: 29.6 g/dL — ABNORMAL LOW (ref 30.0–36.0)
MCV: 71.2 fL — ABNORMAL LOW (ref 80.0–100.0)
Monocytes Absolute: 0.7 10*3/uL (ref 0.1–1.0)
Monocytes Relative: 5 %
Neutro Abs: 10.6 10*3/uL — ABNORMAL HIGH (ref 1.7–7.7)
Neutrophils Relative %: 75 %
Platelets: 472 10*3/uL — ABNORMAL HIGH (ref 150–400)
RBC: 4.03 MIL/uL (ref 3.87–5.11)
RDW: 18.1 % — ABNORMAL HIGH (ref 11.5–15.5)
WBC: 14 10*3/uL — ABNORMAL HIGH (ref 4.0–10.5)
nRBC: 0 % (ref 0.0–0.2)

## 2023-07-03 LAB — HEMOGLOBIN AND HEMATOCRIT, BLOOD
HCT: 29.7 % — ABNORMAL LOW (ref 36.0–46.0)
Hemoglobin: 8.8 g/dL — ABNORMAL LOW (ref 12.0–15.0)

## 2023-07-03 LAB — RETICULOCYTES
Immature Retic Fract: 38.8 % — ABNORMAL HIGH (ref 2.3–15.9)
RBC.: 3.95 MIL/uL (ref 3.87–5.11)
Retic Count, Absolute: 58.9 10*3/uL (ref 19.0–186.0)
Retic Ct Pct: 1.5 % (ref 0.4–3.1)

## 2023-07-03 LAB — RESPIRATORY PANEL BY PCR

## 2023-07-03 LAB — IRON AND TIBC
Iron: 22 ug/dL — ABNORMAL LOW (ref 28–170)
Saturation Ratios: 6 % — ABNORMAL LOW (ref 10.4–31.8)
TIBC: 344 ug/dL (ref 250–450)
UIBC: 322 ug/dL

## 2023-07-03 LAB — FOLATE: Folate: 19.3 ng/mL (ref >5.9)

## 2023-07-03 LAB — TYPE AND SCREEN
ABO/RH(D): B POS
Antibody Screen: NEGATIVE

## 2023-07-03 LAB — STREP PNEUMONIAE URINARY ANTIGEN: Strep Pneumo Urinary Antigen: NEGATIVE

## 2023-07-03 LAB — GLUCOSE, CAPILLARY
Glucose-Capillary: 101 mg/dL — ABNORMAL HIGH (ref 70–99)
Glucose-Capillary: 81 mg/dL (ref 70–99)
Glucose-Capillary: 88 mg/dL (ref 70–99)
Glucose-Capillary: 91 mg/dL (ref 70–99)

## 2023-07-03 LAB — TROPONIN I (HIGH SENSITIVITY): Troponin I (High Sensitivity): 5 ng/L (ref <18)

## 2023-07-03 LAB — LACTIC ACID, PLASMA: Lactic Acid, Venous: 1.3 mmol/L (ref 0.5–1.9)

## 2023-07-03 LAB — MRSA NEXT GEN BY PCR, NASAL: MRSA by PCR Next Gen: NOT DETECTED

## 2023-07-03 LAB — BRAIN NATRIURETIC PEPTIDE: B Natriuretic Peptide: 45.7 pg/mL (ref 0.0–100.0)

## 2023-07-03 LAB — FERRITIN: Ferritin: 29 ng/mL (ref 11–307)

## 2023-07-03 MED ORDER — LAMOTRIGINE 25 MG PO TABS
225.0000 mg | ORAL_TABLET | Freq: Every day | ORAL | Status: DC
  Administered 2023-07-03 – 2023-07-04 (×2): 225 mg via ORAL
  Filled 2023-07-03 (×2): qty 9

## 2023-07-03 MED ORDER — ARFORMOTEROL TARTRATE 15 MCG/2ML IN NEBU
15.0000 ug | INHALATION_SOLUTION | Freq: Two times a day (BID) | RESPIRATORY_TRACT | Status: DC
  Administered 2023-07-04 – 2023-07-05 (×3): 15 ug via RESPIRATORY_TRACT
  Filled 2023-07-03 (×4): qty 2

## 2023-07-03 MED ORDER — PANTOPRAZOLE SODIUM 40 MG PO TBEC
40.0000 mg | DELAYED_RELEASE_TABLET | Freq: Every day | ORAL | Status: DC
  Administered 2023-07-04 – 2023-07-05 (×2): 40 mg via ORAL
  Filled 2023-07-03 (×2): qty 1

## 2023-07-03 MED ORDER — IOHEXOL 350 MG/ML SOLN
75.0000 mL | Freq: Once | INTRAVENOUS | Status: AC | PRN
  Administered 2023-07-03: 75 mL via INTRAVENOUS

## 2023-07-03 MED ORDER — SODIUM CHLORIDE 0.9 % IV SOLN
2.0000 g | Freq: Once | INTRAVENOUS | Status: AC
  Administered 2023-07-03: 2 g via INTRAVENOUS
  Filled 2023-07-03: qty 12.5

## 2023-07-03 MED ORDER — LACTATED RINGERS IV SOLN: INTRAVENOUS | Status: DC

## 2023-07-03 MED ORDER — ASPIRIN 81 MG PO TBEC
81.0000 mg | DELAYED_RELEASE_TABLET | Freq: Every day | ORAL | Status: DC
  Administered 2023-07-04 – 2023-07-05 (×2): 81 mg via ORAL
  Filled 2023-07-03 (×2): qty 1

## 2023-07-03 MED ORDER — METOPROLOL SUCCINATE ER 50 MG PO TB24
50.0000 mg | ORAL_TABLET | Freq: Every day | ORAL | Status: DC
  Administered 2023-07-04: 50 mg via ORAL
  Filled 2023-07-03: qty 1

## 2023-07-03 MED ORDER — NITROGLYCERIN 0.4 MG SL SUBL: 0.4000 mg | SUBLINGUAL_TABLET | SUBLINGUAL | Status: DC | PRN

## 2023-07-03 MED ORDER — CLOPIDOGREL BISULFATE 75 MG PO TABS
75.0000 mg | ORAL_TABLET | Freq: Every day | ORAL | Status: DC
  Administered 2023-07-04 – 2023-07-05 (×2): 75 mg via ORAL
  Filled 2023-07-03 (×2): qty 1

## 2023-07-03 MED ORDER — MORPHINE SULFATE (PF) 4 MG/ML IV SOLN
6.0000 mg | Freq: Once | INTRAVENOUS | Status: AC
  Administered 2023-07-03: 6 mg via INTRAVENOUS
  Filled 2023-07-03: qty 2

## 2023-07-03 MED ORDER — INSULIN DETEMIR 100 UNIT/ML ~~LOC~~ SOLN
27.0000 [IU] | Freq: Every day | SUBCUTANEOUS | Status: DC
  Filled 2023-07-03: qty 0.27

## 2023-07-03 MED ORDER — SODIUM CHLORIDE 0.9 % IV BOLUS
500.0000 mL | Freq: Once | INTRAVENOUS | Status: AC
  Administered 2023-07-03: 500 mL via INTRAVENOUS

## 2023-07-03 MED ORDER — PANTOPRAZOLE SODIUM 40 MG IV SOLR
40.0000 mg | Freq: Once | INTRAVENOUS | Status: AC
  Administered 2023-07-03: 40 mg via INTRAVENOUS
  Filled 2023-07-03: qty 10

## 2023-07-03 MED ORDER — SODIUM CHLORIDE 0.9 % IV SOLN
2.0000 g | Freq: Three times a day (TID) | INTRAVENOUS | Status: DC
  Administered 2023-07-03 – 2023-07-04 (×2): 2 g via INTRAVENOUS
  Filled 2023-07-03 (×3): qty 12.5

## 2023-07-03 MED ORDER — UMECLIDINIUM BROMIDE 62.5 MCG/ACT IN AEPB
1.0000 | INHALATION_SPRAY | Freq: Every day | RESPIRATORY_TRACT | Status: DC
  Administered 2023-07-04 – 2023-07-05 (×2): 1 via RESPIRATORY_TRACT
  Filled 2023-07-03 (×2): qty 7

## 2023-07-03 MED ORDER — INSULIN ASPART 100 UNIT/ML IJ SOLN
0.0000 [IU] | INTRAMUSCULAR | Status: DC
  Administered 2023-07-04: 5 [IU] via SUBCUTANEOUS
  Administered 2023-07-04: 1 [IU] via SUBCUTANEOUS
  Filled 2023-07-03: qty 1

## 2023-07-03 MED ORDER — INSULIN DETEMIR 100 UNIT/ML ~~LOC~~ SOLN
18.0000 [IU] | Freq: Every day | SUBCUTANEOUS | Status: DC
  Filled 2023-07-03 (×2): qty 0.18

## 2023-07-03 MED ORDER — LAMOTRIGINE 25 MG PO TABS: 200.0000 mg | ORAL_TABLET | Freq: Every day | ORAL | Status: DC

## 2023-07-03 MED ORDER — OXYCODONE HCL 5 MG PO TABS
10.0000 mg | ORAL_TABLET | Freq: Two times a day (BID) | ORAL | Status: DC | PRN
  Administered 2023-07-03 – 2023-07-05 (×4): 10 mg via ORAL
  Filled 2023-07-03 (×4): qty 2

## 2023-07-03 MED ORDER — VANCOMYCIN HCL IN DEXTROSE 1-5 GM/200ML-% IV SOLN
1000.0000 mg | Freq: Once | INTRAVENOUS | Status: AC
  Administered 2023-07-03: 1000 mg via INTRAVENOUS
  Filled 2023-07-03: qty 200

## 2023-07-03 MED ORDER — TOPIRAMATE 25 MG PO TABS
25.0000 mg | ORAL_TABLET | Freq: Every day | ORAL | Status: DC
  Administered 2023-07-03 – 2023-07-05 (×3): 25 mg via ORAL
  Filled 2023-07-03 (×3): qty 1

## 2023-07-03 MED ORDER — ALBUTEROL SULFATE (2.5 MG/3ML) 0.083% IN NEBU: 2.5000 mg | INHALATION_SOLUTION | RESPIRATORY_TRACT | Status: DC | PRN

## 2023-07-03 MED ORDER — ENOXAPARIN SODIUM 40 MG/0.4ML IJ SOSY
40.0000 mg | PREFILLED_SYRINGE | INTRAMUSCULAR | Status: DC
  Administered 2023-07-03 – 2023-07-04 (×2): 40 mg via SUBCUTANEOUS
  Filled 2023-07-03 (×2): qty 0.4

## 2023-07-03 MED ORDER — ESCITALOPRAM OXALATE 10 MG PO TABS
20.0000 mg | ORAL_TABLET | Freq: Every day | ORAL | Status: DC
  Administered 2023-07-04 – 2023-07-05 (×2): 20 mg via ORAL
  Filled 2023-07-03 (×2): qty 2

## 2023-07-03 MED ORDER — VANCOMYCIN HCL IN DEXTROSE 1-5 GM/200ML-% IV SOLN
1000.0000 mg | Freq: Two times a day (BID) | INTRAVENOUS | Status: DC
  Administered 2023-07-03 – 2023-07-04 (×2): 1000 mg via INTRAVENOUS
  Filled 2023-07-03 (×2): qty 200

## 2023-07-03 MED ORDER — PREGABALIN 75 MG PO CAPS
225.0000 mg | ORAL_CAPSULE | Freq: Two times a day (BID) | ORAL | Status: DC
  Administered 2023-07-03 – 2023-07-05 (×4): 225 mg via ORAL
  Filled 2023-07-03 (×4): qty 3

## 2023-07-03 NOTE — ED Notes (Signed)
Pt up to restroom, total assist from visitor, upon returning to bed pt had a syncopal episode when sitting on bed.

## 2023-07-03 NOTE — Telephone Encounter (Signed)
I would see what cardiology has to say. I think she needs to go back to ED

## 2023-07-03 NOTE — Progress Notes (Signed)
CODE SEPSIS - PHARMACY COMMUNICATION  **Broad Spectrum Antibiotics should be administered within 1 hour of Sepsis diagnosis**  Time Code Sepsis Called/Page Received: 1302  Antibiotics Ordered: Cefepime + vancomycin  Time of 1st antibiotic administration: 1320  Additional action taken by pharmacy: [name]  Erika [name] 07/03/2023  1:07 PM

## 2023-07-03 NOTE — ED Notes (Signed)
Pt asking for Hemi-walker due to left sided deficits from previous stroke to help get to restroom. None available currently.

## 2023-07-03 NOTE — Consult Note (Signed)
PHARMACY -  BRIEF ANTIBIOTIC NOTE   Pharmacy has received consult(s) for cefepime and vancomycin from an ED provider.  The patient's profile has been reviewed for ht/wt/allergies/indication/available labs.    One time order(s) placed for  --Cefepime 2 g IV --Vancomycin 1 g IV  Further antibiotics/pharmacy consults should be ordered by admitting physician if indicated.                       Thank you, Tressie Ellis 07/03/2023  1:06 PM

## 2023-07-03 NOTE — ED Notes (Signed)
Pt returned from MRI °

## 2023-07-03 NOTE — Telephone Encounter (Signed)
Called and spoke to patient's spouse, Pearse(DPR). He stated that patient has been experiencing weakness, dizziness and increased SOB. She had fainted and fell once. BP today 92/62 HR 88. Spo2 is dropping in the 80's on 6L.   I have spoken to Dr. Jayme Cloud who recommended ED.   Cy Blamer is aware of recommendations. He will call EMS.  Nothing further needed.

## 2023-07-03 NOTE — Consult Note (Signed)
Pharmacy Antibiotic Note  Erika [name] is a 52 y.o. female with medical history including chronic hypoxic respiratory failure on 5L O2 at baseline, CAD s/p CABY, CVA with residual left sided deficits, HTN, PAD s/p stenting, DM, HLD, HFpEF admitted on 07/03/2023 with pneumonia.  Pharmacy has been consulted for vancomycin and cefepime dosing.  Plan:  Cefepime 2 g IV q8h  Vancomycin 1 g IV q12h --Calculated AUC: 516, Cmin: 15.4 --Daily Scr per protocol --Levels at steady state or as clinically indicated  Height: 5\' 6"  (167.6 cm) Weight: 87 kg (191 lb 12.8 oz) IBW/kg (Calculated) : 59.3  Temp (24hrs), Avg:98.5 F (36.9 C), Min:98.5 F (36.9 C), Max:98.5 F (36.9 C)  Recent Labs  Lab 07/03/23 1126  WBC 14.0*  CREATININE 0.92  LATICACIDVEN 1.3    Estimated Creatinine Clearance: 82.2 mL/min (by C-G formula based on SCr of 0.92 mg/dL).    Allergies  Allergen Reactions   Chantix [Varenicline Tartrate] Other (See Comments)    Feels "crazy" and angry     Antimicrobials this admission: Cefepime 710 >>  Vancomycin 7/10 >>   Dose adjustments this admission: [name]  Microbiology results: 7/10 BCx: pending 7/10 Sputum: pending  7/10 MRSA PCR: pending 7/10 RVP: pending  Thank you for allowing pharmacy to be a part of this patient's care.  Tressie Ellis 07/03/2023 2:01 PM

## 2023-07-03 NOTE — ED Provider Notes (Signed)
[facility] Provider Note    Event Date/Time   First MD Initiated Contact with Patient 07/03/23 1113     (approximate)   History   Shortness of Breath and Weakness   HPI  Erika [name] is a 52 y.o. female past medical history significant for prior CVA with residual left-sided weakness, hypertension, CAD status post CABG, chronic respiratory failure on 6 L home oxygen, diabetes, hyperlipidemia, HFpEF, OSA, who presents to the emergency department following episodes of passing out.  Patient states that she was recently admitted and discharged from the hospital following aspiration pneumonia.  States that she was in her normal state of health at time of discharge.  Over the past couple of days states that she started having dizziness and weakness when she went to go stand up.  Her symptoms would improve when she laid down.  Does believe she had another episode of aspiration a couple of days ago.  Does endorse a cough.  Denies nausea, vomiting or diarrhea.  No abdominal pain.  No dysuria, urinary urgency or frequency.  Decreased p.o. intake.     Physical Exam   Triage Vital Signs: ED Triage Vitals  Enc Vitals Group     BP 07/03/23 1116 111/64     Pulse Rate 07/03/23 1116 80     Resp --      Temp --      Temp src --      SpO2 07/03/23 1116 98 %     Weight 07/03/23 1120 191 lb 12.8 oz (87 kg)     Height 07/03/23 1120 5\' 6"  (1.676 m)     Head Circumference --      Peak Flow --      Pain Score 07/03/23 1119 9     Pain Loc --      Pain Edu? --      Excl. in GC? --     Most recent vital signs: Vitals:   07/03/23 1116  BP: 111/64  Pulse: 80  Resp: 18  Temp: 98.5 F (36.9 C)  SpO2: 98%    Physical Exam Constitutional:      Appearance: She is well-developed. She is obese.  HENT:     Head: Atraumatic.  Eyes:     Conjunctiva/sclera: Conjunctivae normal.  Cardiovascular:     Rate and Rhythm: Regular rhythm.  Pulmonary:     Effort: Tachypnea  present. No respiratory distress.     Breath sounds: Rhonchi present.     Comments: 6 L nasal cannula Chest:     Chest wall: No tenderness.  Abdominal:     General: There is no distension.     Palpations: Abdomen is soft.  Genitourinary:    Comments: Chaperone present Rectal exam with no gross blood or melena Musculoskeletal:        General: Normal range of motion.     Cervical back: Normal range of motion.     Right lower leg: No edema.     Left lower leg: No edema.  Skin:    General: Skin is warm.     Capillary Refill: Capillary refill takes less than 2 seconds.  Neurological:     Mental Status: She is alert. Mental status is at baseline.     Comments: Chronic weakness of the left side     IMPRESSION / MDM / ASSESSMENT AND PLAN / ED COURSE  I reviewed the triage vital signs and the nursing notes.  On chart review patient  was recently admitted to the hospital and treated for pneumonia.  Differential diagnosis including pneumonia, pulmonary embolism, CHF, ACS, electrolyte abnormality, dehydration, anemia, GI bleed  EKG  I, Corena Herter, the attending physician, personally viewed and interpreted this ECG.   Rate: Normal  Rhythm: Normal sinus  Axis: Normal  Intervals: Normal  ST&T Change: None  No tachycardic or bradycardic dysrhythmias while on cardiac telemetry.  RADIOLOGY I independently reviewed imaging, my interpretation of imaging: CT scan of the chest with concern for multifocal pneumonia.  Read as bibasilar opacities concerning for evolving multifocal pneumonia.  Fluid-filled esophagus with debris concerning for recurrent/intermittent aspiration.  LABS (all labs ordered are listed, but only abnormal results are displayed) Labs interpreted as -    Labs Reviewed  COMPREHENSIVE METABOLIC PANEL - Abnormal; Notable for the following components:      Result Value   Glucose, Bld 130 (*)    Albumin 3.1 (*)    Total Bilirubin <0.1 (*)    All other components  within normal limits  CBC WITH DIFFERENTIAL/PLATELET - Abnormal; Notable for the following components:   WBC 14.0 (*)    Hemoglobin 8.5 (*)    HCT 28.7 (*)    MCV 71.2 (*)    MCH 21.1 (*)    MCHC 29.6 (*)    RDW 18.1 (*)    Platelets 472 (*)    Neutro Abs 10.6 (*)    Abs Immature Granulocytes 0.16 (*)    All other components within normal limits  CULTURE, BLOOD (SINGLE)  CULTURE, BLOOD (SINGLE)  LACTIC ACID, PLASMA  PROTIME-INR  APTT  URINALYSIS, W/ REFLEX TO CULTURE (INFECTION SUSPECTED)  BRAIN NATRIURETIC PEPTIDE  POC URINE PREG, ED  TYPE AND SCREEN  TROPONIN I (HIGH SENSITIVITY)     MDM    Patient presents to the emergency department with recurrent episodes of aspiration.  Concern for pneumonia.  Blood cultures obtained.  Initial lactic acid within normal limits.  Did not require 30 cc/kg of IV fluids.  Given 500 bolus and will reevaluate.  Abnormality on chest x-ray, does have risk factors for PE so we will obtain a CTA to evaluate for pulmonary embolism and progression of pneumonia  CTA concerning for multifocal pneumonia.  Concern that the patient is having frequent episodes of aspiration.  States that she has been evaluated by speech and has been cleared however concern for ability to tolerate p.o. well given her chronic aspirations.  Will broaden the patient out on antibiotics with vancomycin and cefepime given her recent hospitalization.  Consulted hospitalist for admission.  Signs of anemia, no signs of a GI bleed.-Screened.   PROCEDURES:  Critical Care performed: yes  .Critical Care  Performed by: Corena Herter, MD Authorized by: Corena Herter, MD   Critical care provider statement:    Critical care time (minutes):  30   Critical care time was exclusive of:  Separately billable procedures and treating other patients   Critical care was necessary to treat or prevent imminent or life-threatening deterioration of the following conditions:  Respiratory  failure   Critical care was time spent personally by me on the following activities:  Development of treatment plan with patient or surrogate, discussions with consultants, evaluation of patient's response to treatment, examination of patient, ordering and review of laboratory studies, ordering and review of radiographic studies, ordering and performing treatments and interventions, pulse oximetry, re-evaluation of patient's condition and review of old charts   Patient's presentation is most consistent with acute presentation with potential  threat to life or bodily function.   MEDICATIONS ORDERED IN ED: Medications  lactated ringers infusion (has no administration in time range)  vancomycin (VANCOCIN) IVPB 1000 mg/200 mL premix (has no administration in time range)  ceFEPIme (MAXIPIME) 2 g in sodium chloride 0.9 % 100 mL IVPB (has no administration in time range)  sodium chloride 0.9 % bolus 500 mL (500 mLs Intravenous New Bag/Given 07/03/23 1146)  pantoprazole (PROTONIX) injection 40 mg (40 mg Intravenous Given 07/03/23 1222)  morphine (PF) 4 MG/ML injection 6 mg (6 mg Intravenous Given 07/03/23 1223)  iohexol (OMNIPAQUE) 350 MG/ML injection 75 mL (75 mLs Intravenous Contrast Given 07/03/23 1231)    FINAL CLINICAL IMPRESSION(S) / ED DIAGNOSES   Final diagnoses:  Aspiration into airway, initial encounter  Multifocal pneumonia  Anemia, unspecified type     Rx / DC Orders   ED Discharge Orders     None        Note:  This document was prepared using Dragon voice recognition software and may include unintentional dictation errors.   Corena Herter, MD 07/03/23 1312

## 2023-07-03 NOTE — H&P (Addendum)
History and Physical    Patient: Erika [name] Erika [name] DOB: 07-Feb-1971 DOA: 07/03/2023 DOS: the patient was seen and examined on 07/03/2023 PCP: Eden Emms, NP  Patient coming from: Home  Chief Complaint:  Chief Complaint  Patient presents with   Shortness of Breath   Weakness   HPI: Erika [name] is a 52 y.o. female with medical history significant of multiple medical issues including chronic respiratory failure on 6 L, multivessel CAD status post CABG, carotid artery disease, CVA with chronic left-sided deficits, hypertension, peripheral artery disease status post stenting, type 2 diabetes, hyperlipidemia, HFpEF complex deep apnea syndrome presenting with acute on chronic respiratory failure with hypoxia, multifocal pneumonia with concern for aspiration, left-sided weakness.  History primarily from patient as well as her husband.  Per report, patient with worsening weakness since around 4 to 5 days ago.  Positive malaise, shortness of breath and cough.  No fevers or chills.  No nausea or vomiting.  No chest pain.  Patient does report a choking event associated with drinking liquids around 4 to 5 days ago.  No abdominal pain no diarrhea.  Patient with significant weakness and dyspnea on exertion to the point where she can barely move 1 to 2 feet.  No longer smoking.  No reported wheezing.  Per the husband, she is also has significant worsening left-sided weakness.  No reported slurred speech or confusion.  Patient with couple episodes of falls/presyncope.  No reported head trauma loss consciousness.  Noted recent admission at the end of June for similar issues including sepsis and aspiration pneumonia. Presented to the ER afebrile, hemodynamically stable.  Satting in the mid 90s on 6 L.  White count 14, hemoglobin 8.5, platelets 472, lactate 1.3, creatinine 0.92, troponin 5.  CT of the chest with bibasilar patchy consolidative opacities concerning for multifocal pneumonia.   Also with patulous fluid-filled esophagus and minimal layering debris concerning for intermittent recurrent aspiration. Review of Systems: As mentioned in the history of present illness. All other systems reviewed and are negative. Past Medical History:  Diagnosis Date   Acute respiratory failure with hypoxia (HCC) 06/24/2021   Arthralgia of temporomandibular joint    CAD, multiple vessel    a. 06/2016 Cath: ostLM 40%, ostLAD 40%, pLAD 95%, ost-pLCx 60%, pLCx 95%, mLCx 60%, mRCA 95%, D2 50%, LVSF nl;  b. 07/2016 CABG x 4 (LIMA->LAD, VG->Diag, VG->OM, VG->RCA); c. 08/2016 Cath: 3VD w/ 4/4 patent grafts. LAD distal to LIMA has diff dzs->Med rx; d. 08/2020 Cath: 4/4 patent grafts, native 3VD. EF 55-65%-->Med Rx.   Carotid arterial disease (HCC)    a. 07/2016 s/p R CEA; b. 02/2021 U/S: RICA 40-59%, LICA 1-39%.   Cerebrovascular disease    Clotting disorder (HCC)    Complex sleep apnea syndrome 10/03/2022   Depression    Diastolic dysfunction    a. 06/2016 Echo: EF 50-55%, mild inf wall HK, GR1DD, mild MR, RV sys fxn nl, mildly dilated LA, PASP nl; b. 06/2021 Echo: EF 60-65%, no rwma. Nl RV fxn.   Fatty liver disease, nonalcoholic 2016   History of blood transfusion    with heart surgery   HLD (hyperlipidemia)    Labile hypertension    a. prior renal ngiogram negative for RAS in 03/2016; b. catecholamines and metanephrines normal, mildly elevated renin with normal aldosterone and normal ratio in 02/2016   Myocardial infarction [facility]) 2017   Obesity    PAD (peripheral artery disease) (HCC)    a. 09/2018 s/p L SFA  stenting; b. 07/2019 Periph Angio: Patent m/d L SFA stent w/ 100% L SFA distal to stent. L AT 100d, L Peroneal diff dzs-->Med Rx; c. 02/2021 ABIs: stable @ 0.61 on R and 0.46 on L.   PTSD (post-traumatic stress disorder)    Stroke (HCC)    Tobacco abuse    Type 2 diabetes mellitus (HCC) 12/2015   Past Surgical History:  Procedure Laterality Date   ABDOMINAL AORTOGRAM W/LOWER EXTREMITY [name]  10/15/2018   Procedure: ABDOMINAL AORTOGRAM W/LOWER EXTREMITY;  Surgeon: Iran Ouch, MD;  Location: [facility];  Service: Cardiovascular;  Laterality: [name];   ABDOMINAL AORTOGRAM W/LOWER EXTREMITY Bilateral 08/19/2019   Procedure: ABDOMINAL AORTOGRAM W/LOWER EXTREMITY;  Surgeon: Iran Ouch, MD;  Location: [facility];  Service: Cardiovascular;  Laterality: Bilateral;   CARDIAC CATHETERIZATION [name] 06/29/2016   Procedure: Left Heart Cath and Coronary Angiography;  Surgeon: Antonieta Iba, MD;  Location: [facility];  Service: Cardiovascular;  Laterality: [name];   CARDIAC CATHETERIZATION [name] 08/29/2016   Procedure: Left Heart Cath and Cors/Grafts Angiography;  Surgeon: Iran Ouch, MD;  Location: [facility];  Service: Cardiovascular;  Laterality: [name];   CESAREAN SECTION     CHOLECYSTECTOMY     CORONARY ARTERY BYPASS GRAFT [name] 07/06/2016   Procedure: CORONARY ARTERY BYPASS GRAFTING (CABG) x four, using left internal mammary artery and right leg greater saphenous vein harvested endoscopically;  Surgeon: Kerin Perna, MD;  Location: [facility] OR;  Service: Open Heart Surgery;  Laterality: [name];   ENDARTERECTOMY Right 07/06/2016   Procedure: ENDARTERECTOMY CAROTID;  Surgeon: Larina Earthly, MD;  Location: [facility] OR;  Service: Vascular;  Laterality: Right;   ENDARTERECTOMY Right 04/27/2020   Procedure: REDO OF RIGHT ENDARTERECTOMY CAROTID;  Surgeon: Larina Earthly, MD;  Location: [facility] OR;  Service: Vascular;  Laterality: Right;   LEFT HEART CATH AND CORS/GRAFTS ANGIOGRAPHY [name] 08/24/2020   Procedure: LEFT HEART CATH AND CORS/GRAFTS ANGIOGRAPHY;  Surgeon: Iran Ouch, MD;  Location: [facility];  Service: Cardiovascular;  Laterality: [name];   LEFT HEART CATH AND CORS/GRAFTS ANGIOGRAPHY [name] 01/29/2022   Procedure: LEFT HEART CATH AND CORS/GRAFTS ANGIOGRAPHY;  Surgeon: Iran Ouch, MD;  Location: [facility];  Service: Cardiovascular;  Laterality: [name];    PERIPHERAL VASCULAR CATHETERIZATION [name] 04/18/2016   Procedure: Renal Angiography;  Surgeon: Iran Ouch, MD;  Location: [facility];  Service: Cardiovascular;  Laterality: [name];   PERIPHERAL VASCULAR INTERVENTION Left 10/15/2018   Procedure: PERIPHERAL VASCULAR INTERVENTION;  Surgeon: Iran Ouch, MD;  Location: [facility];  Service: Cardiovascular;  Laterality: Left;  Left superficial femoral   TEE WITHOUT CARDIOVERSION [name] 07/06/2016   Procedure: TRANSESOPHAGEAL ECHOCARDIOGRAM (TEE);  Surgeon: Kerin Perna, MD;  Location: [facility] OR;  Service: Open Heart Surgery;  Laterality: [name];   TONSILLECTOMY     Social History:  reports that she quit smoking about 2 years ago. Her smoking use included cigarettes. She has a 15.00 pack-year smoking history. She has never been exposed to tobacco smoke. She has never used smokeless tobacco. She reports that she does not currently use alcohol. She reports that she does not use drugs.  Allergies  Allergen Reactions   Chantix [Varenicline Tartrate] Other (See Comments)    Feels "crazy" and angry     Family History  Adopted: Yes  Problem Relation Age of Onset   Diabetes Mother    Diabetes Father    Alcohol  abuse Father    Heart disease Father    Drug abuse Father    Stroke Sister    Anxiety disorder Sister     Prior to Admission medications   Medication Sig Start Date End Date Taking? Authorizing Provider  acetaminophen (TYLENOL) 325 MG tablet Take 1-2 tablets (325-650 mg total) by mouth every 4 (four) hours as needed for mild pain. 08/01/21  Yes Love, Evlyn Kanner, PA-C  albuterol (PROVENTIL) (2.5 MG/3ML) 0.083% nebulizer solution Take 3 mLs (2.5 mg total) by nebulization every 4 (four) hours as needed for wheezing or shortness of breath. 06/14/23 07/14/23 Yes Leeroy Bock, MD  aspirin EC 81 MG tablet Take 1 tablet (81 mg total) by mouth daily. Swallow whole. 06/14/23  Yes Leeroy Bock, MD  chlorproMAZINE (THORAZINE) 25 MG  tablet Take 3 tablets (75 mg total) by mouth at bedtime. 06/07/23  Yes Arfeen, Phillips Grout, MD  clonazePAM (KLONOPIN) 0.5 MG tablet Take 0.5 tablets (0.25 mg total) by mouth 2 (two) times daily. Take 1/2 tab twice daily. No dissolving tablets. 06/14/23  Yes Leeroy Bock, MD  clopidogrel (PLAVIX) 75 MG tablet Take 1 tablet (75 mg total) by mouth daily. 03/06/23  Yes Sater, Pearletha Furl, MD  cyclobenzaprine (FLEXERIL) 10 MG tablet TAKE ONE TABLET BY MOUTH AT BEDTIME 06/26/23  Yes Raulkar, Drema Pry, MD  escitalopram (LEXAPRO) 20 MG tablet Take 1 tablet (20 mg total) by mouth daily. 05/13/23 07/12/23 Yes Arfeen, Phillips Grout, MD  Evolocumab (REPATHA SURECLICK) 140 MG/ML SOAJ Inject 140 mg into the skin as directed. Inject 1 Dose into the skin every 14 (fourteen) days. 10/31/22  Yes Furth, Cadence H, PA-C  icosapent Ethyl (VASCEPA) 1 g capsule Take 2 g by mouth 2 (two) times daily.   Yes [provider]  insulin detemir (LEVEMIR) 100 UNIT/ML injection Inject 0.27 mLs (27 Units total) into the skin daily. 04/08/23  Yes Eden Emms, NP  lamoTRIgine (LAMICTAL) 200 MG tablet Take 1 tablet (200 mg total) by mouth at bedtime. 05/13/23  Yes Arfeen, Phillips Grout, MD  lamoTRIgine (LAMICTAL) 25 MG tablet Take 1 tablet (25 mg total) by mouth daily. 05/13/23 07/12/23 Yes Arfeen, Phillips Grout, MD  metoprolol succinate (TOPROL-XL) 25 MG 24 hr tablet Take 2 tablets (50 mg total) by mouth daily. Take with or immediately following a meal. 06/14/23 09/12/23 Yes Leeroy Bock, MD  MOUNJARO 7.5 MG/0.5ML Pen INJECT THE CONTENTS OF ONE PEN UNDER THE SKIN WEEKLY ON THE SAME DAY EACH WEEK 06/21/23  Yes Eden Emms, NP  nitroGLYCERIN (NITROSTAT) 0.4 MG SL tablet Place 1 tablet (0.4 mg total) under the tongue every 5 (five) minutes as needed for chest pain. 03/15/21  Yes Iran Ouch, MD  Oxycodone HCl 10 MG TABS Take 1 tablet (10 mg total) by mouth 2 (two) times daily as needed. 06/10/23  Yes Jones Bales, NP  pantoprazole (PROTONIX) 40  MG tablet TAKE ONE TABLET BY MOUTH ONE TIME DAILY 06/12/23  Yes Raulkar, Drema Pry, MD  polyethylene glycol (MIRALAX / GLYCOLAX) 17 g packet Take 17 g by mouth 2 (two) times daily. 06/14/23 07/14/23 Yes Leeroy Bock, MD  pregabalin (LYRICA) 225 MG capsule Take 1 capsule (225 mg total) by mouth 2 (two) times daily. 04/11/23  Yes Jones Bales, NP  senna (SENOKOT) 8.6 MG TABS tablet Take 1 tablet (8.6 mg total) by mouth daily. 06/14/23 07/14/23 Yes Leeroy Bock, MD  Tiotropium Bromide-Olodaterol (STIOLTO RESPIMAT) 2.5-2.5 MCG/ACT AERS Inhale  2 puffs into the lungs daily. 06/24/23  Yes Glenford Bayley, NP  topiramate (TOPAMAX) 25 MG tablet Take 1 tablet (25 mg total) by mouth at bedtime. 05/16/23  Yes Raulkar, Drema Pry, MD  albuterol (VENTOLIN HFA) 108 (90 Base) MCG/ACT inhaler Inhale 2 puffs into the lungs every 6 (six) hours as needed for wheezing. Patient not taking: Reported on 07/03/2023 04/08/23   Eden Emms, NP  Continuous Glucose Sensor (DEXCOM G7 SENSOR) MISC APPLY ONE SENSOR TO THE BACK OF YOUR UPPER ARM. REPLACE EVERY 10 DAYS. 04/12/23   Eden Emms, NP  dextromethorphan-guaiFENesin (MUCINEX DM) 30-600 MG 12hr tablet Take 1 tablet by mouth 2 (two) times daily as needed for cough. Patient not taking: Reported on 07/03/2023 06/24/23   Glenford Bayley, NP  Stann Ore MINI PEN NEEDLES 31G X 6 MM MISC USE PEN NEEDLES TWO TIMES DAILY 12/26/22   Eden Emms, NP    Physical Exam: Vitals:   07/03/23 1116 07/03/23 1120  BP: 111/64   Pulse: 80   Resp: 18   Temp: 98.5 F (36.9 C)   TempSrc: Oral   SpO2: 98%   Weight:  87 kg  Height:  5\' 6"  (1.676 m)   Physical Exam Constitutional:      Appearance: She is obese.  HENT:     Head: Normocephalic and atraumatic.     Nose: Nose normal.     Mouth/Throat:     Mouth: Mucous membranes are moist.  Eyes:     Pupils: Pupils are equal, round, and reactive to light.  Cardiovascular:     Rate and Rhythm: Normal rate and regular  rhythm.  Pulmonary:     Effort: Pulmonary effort is normal.     Comments: Trace rales bilaterally  Abdominal:     Comments: Obese abdomen  Nontender   + bowel sounds    Skin:    General: Skin is warm.  Neurological:     Comments: Mild mild L sided facial droop-subacute vs chronic  L upper and L lower extremity weakness       Data Reviewed:  There are no new results to review at this time. CT Angio Chest PE W/Cm &/Or Wo Cm CLINICAL DATA:  Shortness of breath, history of COPD. Recently diagnosed with multifocal pneumonia in June 2024.  EXAM: CT ANGIOGRAPHY CHEST WITH CONTRAST  TECHNIQUE: Multidetector CT imaging of the chest was performed using the standard protocol during bolus administration of intravenous contrast. Multiplanar CT image reconstructions and MIPs were obtained to evaluate the vascular anatomy.  RADIATION DOSE REDUCTION: This exam was performed according to the departmental dose-optimization program which includes automated exposure control, adjustment of the mA and/or kV according to patient size and/or use of iterative reconstruction technique.  CONTRAST:  75mL OMNIPAQUE IOHEXOL 350 MG/ML SOLN  COMPARISON:  Chest CT June 12, 2023 and May 29, 2023  FINDINGS: Cardiovascular: Satisfactory opacification of the pulmonary arteries to the segmental level. No evidence of pulmonary embolism. Normal heart size. No pericardial effusion. Severe coronary artery atherosclerosis. Thoracic aortic atherosclerosis. Status post median sternotomy and CABG.  Mediastinum/Nodes: The thyroid gland is within normal limits. No axillary lymphadenopathy. Unchanged prominent mediastinal and hilar lymph nodes. An index right hilar lymph node measures up to 13 mm in short axis on series 4, image 64. Patulous and fluid-filled esophagus.  Lungs/Pleura: Patent central airways. Minimal layering debris in the proximal trachea. Respiratory motion artifact slightly  limits evaluation of the lung parenchyma. Bibasilar patchy consolidative opacities with  air bronchograms, slightly decreased compared to June 12, 2023. No new focal pulmonary opacity. No pleural effusion or pneumothorax.  Upper Abdomen: No acute abnormality.  Musculoskeletal: No chest wall abnormality. No acute or significant osseous findings.  Review of the MIP images confirms the above findings.  IMPRESSION: 1. Bibasilar patchy consolidative opacities, decreased in extent since June 12, 2023, likely representing evolving multifocal pneumonia. 2. Patulous fluid-filled esophagus and minimal layering debris in the proximal trachea, raising concern for intermittent/recurrent aspiration. 3. Unchanged prominent mediastinal and hilar lymph nodes, likely reactive.  Electronically Signed   By: Jacob Moores M.D.   On: 07/03/2023 12:56 DG Chest Port 1 View CLINICAL DATA:  Provided history: Questionable sepsis-evaluate for abnormality. Shortness of breath. Syncopal episode.  EXAM: PORTABLE CHEST 1 VIEW  COMPARISON:  Prior chest radiographs 06/24/2023 and earlier.  FINDINGS: Prior median sternotomy/CABG. Redemonstrated fractures within superior sternotomy wires. The cardiomediastinal silhouette is unchanged. Ill-defined opacity within the right lung base, progressed from the prior examination of 06/24/2023. No appreciable airspace consolidation on the left. No evidence of pleural effusion or pneumothorax. No acute osseous abnormality identified.  IMPRESSION: Ill-defined opacity within the right lung base. This is progressed from the prior examination of 06/24/2023 and may reflect atelectasis and/or airspace consolidation. A superimposed component of scarring is possible.  Electronically Signed   By: Jackey Loge D.O.   On: 07/03/2023 11:55  Lab Results  Component Value Date   WBC 14.0 (H) 07/03/2023   HGB 8.5 (L) 07/03/2023   HCT 28.7 (L) 07/03/2023   MCV 71.2 (L)  07/03/2023   PLT 472 (H) 07/03/2023   Last metabolic panel Lab Results  Component Value Date   GLUCOSE 130 (H) 07/03/2023   NA 140 07/03/2023   K 3.5 07/03/2023   CL 105 07/03/2023   CO2 25 07/03/2023   BUN 8 07/03/2023   CREATININE 0.92 07/03/2023   GFRNONAA >60 07/03/2023   CALCIUM 9.3 07/03/2023   PHOS 3.8 12/24/2021   PROT 6.6 07/03/2023   ALBUMIN 3.1 (L) 07/03/2023   LABGLOB 2.9 09/11/2021   AGRATIO 1.6 09/11/2021   BILITOT <0.1 (L) 07/03/2023   ALKPHOS 57 07/03/2023   AST 15 07/03/2023   ALT 8 07/03/2023   ANIONGAP 10 07/03/2023    Assessment and Plan:  1- Acute on chronic respiratory failure with hypoxia - Increased work of breathing, shortness of breath with exertion over the past 4 to 5 days in the setting of baseline chronic respiratory failure on 6 L - CT imaging with multifocal pneumonia and changes concerning for intermittent versus recurrent aspiration pneumonia - Noted admission at the end of last month for similar issues - Started on cefepime and vancomycin in the ER - Will continue -Panculture   2-Pneumonia -Decompensated respiratory status in setting of baseline chronic tori failure on 6 L with noted multifocal and possible aspiration pneumonia on CT of the chest - IV cefepime and vancomycin for expanded respiratory coverage given recent mission within the past 3 to 4 weeks for similar issues - Blood and respiratory cultures - Urine strep and Legionella, sputum cultures - Expanded respiratory panel - Speech evaluation in the setting of aspiration risk -N.p.o. for now   3-history of CVA - Baseline history of CVA with left-sided deficits - Husband ports worsening left-sided weakness over the past 4 to 5 days - Differential includes worsening of CVA versus secondary to active pneumonia - Will check MRI of the brain to correlate - PT OT evaluation - Neurology consult as  clinically appropriate - Continue home antiplatelet regimen  4-anemia -  Hemoglobin 8.5 on presentation - Baseline hemoglobin appears to be around 8.5-10.5 - No reported active bleeding - Will check anemia panel - Trend hemoglobin - Transfuse for hemoglobin less than 7  5-CAD - Baseline history of CAD status post CABG 2007 - No active chest pain - Continue home regimen  6-Type 2 diabetes - SSI - Continue home basal insulin  7-HFpEF -2D echo February 2023 with a EF of 60 to 65% and grade 2 diastolic dysfunction - Mildly dry to euvolemic on exam - Status post fluid bolus in the setting of initial evaluation for sepsis in ER - Will down titrate IV fluids - Strict ins and outs and daily weights - Follow  8-hypertension - BP stable - Continue home regimen  Greater than 50% was spent in counseling and coordination of care with patient Total encounter time 80 minutes or more   Advance Care Planning:   Code Status: Full Code   Consults: None   Family Communication: Husband and mother in law at the bedside   Severity of Illness: The appropriate patient status for this patient is INPATIENT. Inpatient status is judged to be reasonable and necessary in order to provide the required intensity of service to ensure the patient's safety. The patient's presenting symptoms, physical exam findings, and initial radiographic and laboratory data in the context of their chronic comorbidities is felt to place them at high risk for further clinical deterioration. Furthermore, it is not anticipated that the patient will be medically stable for discharge from the hospital within 2 midnights of admission.   * I certify that at the point of admission it is my clinical judgment that the patient will require inpatient hospital care spanning beyond 2 midnights from the point of admission due to high intensity of service, high risk for further deterioration and high frequency of surveillance required.*  Author: Floydene Flock, MD 07/03/2023 1:49 PM  For on call review  [email or web address].

## 2023-07-03 NOTE — ED Notes (Addendum)
Pt taken to MRI, meds paused due to MRI needing to use IV line.

## 2023-07-03 NOTE — ED Triage Notes (Signed)
Pt brought to ED via [facility] form Home for SOB since Saturday.EMS reports syncopal episode on Saturday and again today. Pt denies abd pain and CP. Pt does wear 6L chronic O2. EMS reports hx of stroke, MI, COPD.  Vitals 91/62 80 HR 24 RR 98.8 oral 144 CBG

## 2023-07-03 NOTE — Plan of Care (Signed)
Patient A&Ox4, from home, up with assist in room. IV abx and fluids maintained. Patient using commode. Supplemental O2 maintained at 6L [place].

## 2023-07-03 NOTE — Sepsis Progress Note (Signed)
Code Sepsis protocol being monitored by e[employer]. 

## 2023-07-04 DIAGNOSIS — J9621 Acute and chronic respiratory failure with hypoxia: Secondary | ICD-10-CM | POA: Diagnosis not present

## 2023-07-04 LAB — COMPREHENSIVE METABOLIC PANEL
ALT: 9 U/L (ref 0–44)
AST: 13 U/L — ABNORMAL LOW (ref 15–41)
Albumin: 2.9 g/dL — ABNORMAL LOW (ref 3.5–5.0)
Alkaline Phosphatase: 52 U/L (ref 38–126)
Anion gap: 7 (ref 5–15)
BUN: 7 mg/dL (ref 6–20)
CO2: 28 mmol/L (ref 22–32)
Calcium: 9.2 mg/dL (ref 8.9–10.3)
Chloride: 105 mmol/L (ref 98–111)
Creatinine, Ser: 0.8 mg/dL (ref 0.44–1.00)
GFR, Estimated: >60 mL/min (ref >60)
Glucose, Bld: 77 mg/dL (ref 70–99)
Potassium: 3.9 mmol/L (ref 3.5–5.1)
Sodium: 140 mmol/L (ref 135–145)
Total Bilirubin: 0.3 mg/dL (ref 0.3–1.2)
Total Protein: 6.1 g/dL — ABNORMAL LOW (ref 6.5–8.1)

## 2023-07-04 LAB — GLUCOSE, CAPILLARY
Glucose-Capillary: 77 mg/dL (ref 70–99)
Glucose-Capillary: 95 mg/dL (ref 70–99)
Glucose-Capillary: 147 mg/dL — ABNORMAL HIGH (ref 70–99)
Glucose-Capillary: 330 mg/dL — ABNORMAL HIGH (ref 70–99)
Glucose-Capillary: 118 mg/dL — ABNORMAL HIGH (ref 70–99)

## 2023-07-04 LAB — VITAMIN B12: Vitamin B-12: 924 pg/mL — ABNORMAL HIGH (ref 180–914)

## 2023-07-04 LAB — CBC
HCT: 27.6 % — ABNORMAL LOW (ref 36.0–46.0)
Hemoglobin: 8 g/dL — ABNORMAL LOW (ref 12.0–15.0)
MCH: 21.2 pg — ABNORMAL LOW (ref 26.0–34.0)
MCHC: 29 g/dL — ABNORMAL LOW (ref 30.0–36.0)
MCV: 73.2 fL — ABNORMAL LOW (ref 80.0–100.0)
Platelets: 445 10*3/uL — ABNORMAL HIGH (ref 150–400)
RBC: 3.77 MIL/uL — ABNORMAL LOW (ref 3.87–5.11)
RDW: 18.2 % — ABNORMAL HIGH (ref 11.5–15.5)
WBC: 7.5 10*3/uL (ref 4.0–10.5)
nRBC: 0 % (ref 0.0–0.2)

## 2023-07-04 MED ORDER — SODIUM CHLORIDE 0.9 % IV SOLN
2.0000 g | INTRAVENOUS | Status: DC
  Administered 2023-07-04: 2 g via INTRAVENOUS
  Filled 2023-07-04: qty 20

## 2023-07-04 MED ORDER — CLONAZEPAM 0.25 MG PO TBDP
0.2500 mg | ORAL_TABLET | Freq: Two times a day (BID) | ORAL | Status: DC
  Administered 2023-07-04 – 2023-07-05 (×3): 0.25 mg via ORAL
  Filled 2023-07-04 (×3): qty 1

## 2023-07-04 MED ORDER — CYCLOBENZAPRINE HCL 10 MG PO TABS
10.0000 mg | ORAL_TABLET | Freq: Every day | ORAL | Status: DC
  Administered 2023-07-04: 10 mg via ORAL
  Filled 2023-07-04: qty 1

## 2023-07-04 MED ORDER — MIDODRINE HCL 5 MG PO TABS
5.0000 mg | ORAL_TABLET | Freq: Three times a day (TID) | ORAL | Status: DC
  Administered 2023-07-04 – 2023-07-05 (×3): 5 mg via ORAL
  Filled 2023-07-04 (×3): qty 1

## 2023-07-04 MED ORDER — CHLORPROMAZINE HCL 50 MG PO TABS
75.0000 mg | ORAL_TABLET | Freq: Every day | ORAL | Status: DC
  Administered 2023-07-04: 75 mg via ORAL
  Filled 2023-07-04: qty 1

## 2023-07-04 MED ORDER — POLYETHYLENE GLYCOL 3350 17 G PO PACK
17.0000 g | PACK | Freq: Two times a day (BID) | ORAL | Status: DC
  Administered 2023-07-04 – 2023-07-05 (×3): 17 g via ORAL
  Filled 2023-07-04 (×3): qty 1

## 2023-07-04 MED ORDER — METOPROLOL SUCCINATE ER 25 MG PO TB24: 25.0000 mg | ORAL_TABLET | Freq: Every day | ORAL | Status: DC

## 2023-07-04 MED ORDER — SODIUM CHLORIDE 0.9 % IV SOLN
3.0000 g | Freq: Four times a day (QID) | INTRAVENOUS | Status: DC
  Filled 2023-07-04: qty 8

## 2023-07-04 NOTE — Evaluation (Signed)
Clinical/Bedside Swallow Evaluation Patient Details  Name: Erika [name] MRN: [id number] Date of Birth: 1971-01-06  Today's Date: 07/04/2023 Time: SLP Start Time (ACUTE ONLY): 1110 SLP Stop Time (ACUTE ONLY): 1130 SLP Time Calculation (min) (ACUTE ONLY): 20 min  Past Medical History:  Past Medical History:  Diagnosis Date   Acute respiratory failure with hypoxia (HCC) 06/24/2021   Arthralgia of temporomandibular joint    CAD, multiple vessel    a. 06/2016 Cath: ostLM 40%, ostLAD 40%, pLAD 95%, ost-pLCx 60%, pLCx 95%, mLCx 60%, mRCA 95%, D2 50%, LVSF nl;  b. 07/2016 CABG x 4 (LIMA->LAD, VG->Diag, VG->OM, VG->RCA); c. 08/2016 Cath: 3VD w/ 4/4 patent grafts. LAD distal to LIMA has diff dzs->Med rx; d. 08/2020 Cath: 4/4 patent grafts, native 3VD. EF 55-65%-->Med Rx.   Carotid arterial disease (HCC)    a. 07/2016 s/p R CEA; b. 02/2021 U/S: RICA 40-59%, LICA 1-39%.   Cerebrovascular disease    Clotting disorder (HCC)    Complex sleep apnea syndrome 10/03/2022   Depression    Diastolic dysfunction    a. 06/2016 Echo: EF 50-55%, mild inf wall HK, GR1DD, mild MR, RV sys fxn nl, mildly dilated LA, PASP nl; b. 06/2021 Echo: EF 60-65%, no rwma. Nl RV fxn.   Fatty liver disease, nonalcoholic 2016   History of blood transfusion    with heart surgery   HLD (hyperlipidemia)    Labile hypertension    a. prior renal ngiogram negative for RAS in 03/2016; b. catecholamines and metanephrines normal, mildly elevated renin with normal aldosterone and normal ratio in 02/2016   Myocardial infarction [facility]) 2017   Obesity    PAD (peripheral artery disease) (HCC)    a. 09/2018 s/p L SFA stenting; b. 07/2019 Periph Angio: Patent m/d L SFA stent w/ 100% L SFA distal to stent. L AT 100d, L Peroneal diff dzs-->Med Rx; c. 02/2021 ABIs: stable @ 0.61 on R and 0.46 on L.   PTSD (post-traumatic stress disorder)    Stroke (HCC)    Tobacco abuse    Type 2 diabetes mellitus (HCC) 12/2015   Past Surgical History:  Past  Surgical History:  Procedure Laterality Date   ABDOMINAL AORTOGRAM W/LOWER EXTREMITY [name] 10/15/2018   Procedure: ABDOMINAL AORTOGRAM W/LOWER EXTREMITY;  Surgeon: Iran Ouch, MD;  Location: [facility];  Service: Cardiovascular;  Laterality: [name];   ABDOMINAL AORTOGRAM W/LOWER EXTREMITY Bilateral 08/19/2019   Procedure: ABDOMINAL AORTOGRAM W/LOWER EXTREMITY;  Surgeon: Iran Ouch, MD;  Location: [facility];  Service: Cardiovascular;  Laterality: Bilateral;   CARDIAC CATHETERIZATION [name] 06/29/2016   Procedure: Left Heart Cath and Coronary Angiography;  Surgeon: Antonieta Iba, MD;  Location: [facility];  Service: Cardiovascular;  Laterality: [name];   CARDIAC CATHETERIZATION [name] 08/29/2016   Procedure: Left Heart Cath and Cors/Grafts Angiography;  Surgeon: Iran Ouch, MD;  Location: [facility];  Service: Cardiovascular;  Laterality: [name];   CESAREAN SECTION     CHOLECYSTECTOMY     CORONARY ARTERY BYPASS GRAFT [name] 07/06/2016   Procedure: CORONARY ARTERY BYPASS GRAFTING (CABG) x four, using left internal mammary artery and right leg greater saphenous vein harvested endoscopically;  Surgeon: Kerin Perna, MD;  Location[facility] OR;  Service: Open Heart Surgery;  Laterality: [name];   ENDARTERECTOMY Right 07/06/2016   Procedure: ENDARTERECTOMY CAROTID;  Surgeon: Larina Earthly, MD;  Location: [facility] OR;  Service: Vascular;  Laterality: Right;   ENDARTERECTOMY Right 04/27/2020   Procedure: REDO OF  RIGHT ENDARTERECTOMY CAROTID;  Surgeon: Larina Earthly, MD;  Location: [facility] OR;  Service: Vascular;  Laterality: Right;   LEFT HEART CATH AND CORS/GRAFTS ANGIOGRAPHY [name] 08/24/2020   Procedure: LEFT HEART CATH AND CORS/GRAFTS ANGIOGRAPHY;  Surgeon: Iran Ouch, MD;  Location: [facility];  Service: Cardiovascular;  Laterality: [name];   LEFT HEART CATH AND CORS/GRAFTS ANGIOGRAPHY [name] 01/29/2022   Procedure: LEFT HEART CATH AND CORS/GRAFTS ANGIOGRAPHY;  Surgeon: Iran Ouch, MD;  Location: [facility];  Service: Cardiovascular;  Laterality: [name];   PERIPHERAL VASCULAR CATHETERIZATION [name] 04/18/2016   Procedure: Renal Angiography;  Surgeon: Iran Ouch, MD;  Location: [facility];  Service: Cardiovascular;  Laterality: [name];   PERIPHERAL VASCULAR INTERVENTION Left 10/15/2018   Procedure: PERIPHERAL VASCULAR INTERVENTION;  Surgeon: Iran Ouch, MD;  Location: [facility];  Service: Cardiovascular;  Laterality: Left;  Left superficial femoral   TEE WITHOUT CARDIOVERSION [name] 07/06/2016   Procedure: TRANSESOPHAGEAL ECHOCARDIOGRAM (TEE);  Surgeon: Kerin Perna, MD;  Location: [facility] OR;  Service: Open Heart Surgery;  Laterality: [name];   TONSILLECTOMY     HPI:  Per H&P: "Erika [name] is a 52 y.o. female with medical history significant of multiple medical issues including chronic respiratory failure on 6 L, multivessel CAD status post CABG, carotid artery disease, CVA with chronic left-sided deficits, hypertension, peripheral artery disease status post stenting, type 2 diabetes, hyperlipidemia, HFpEF complex deep apnea syndrome presenting with acute on chronic respiratory failure with hypoxia, multifocal pneumonia with concern for aspiration, left-sided weakness... Noted recent admission at the end of June for similar issues including sepsis and aspiration pneumonia...CT of the chest with bibasilar patchy consolidative opacities concerning for multifocal pneumonia. Also with patulous fluid-filled esophagus and minimal layering debris concerning for intermittent recurrent aspiration." Pt currently on 6L O2 (consistent with home setting) and a regular solids and thin liquids diet.    Assessment / Plan / Recommendation  Clinical Impression  Pt presents with suspected chronic oral dysphagia and concern for pharyngeal dysphagia in the setting of aspiration PNA. Pt with baseline O2 requirements of 6L, consistent with current presentation. Spouse  present for entirety of session and reported that pt's current baseline is regular solids with chopped solids and thin liquids. Spouse/pt report intermittent cough though sometimes not related to PO intake. Increased WOB reported with meals and talking recently.   During today's bedside swallow assessment, trials completed for thin liquids, purees, and regular solids. No overt or subtle s/sx pharyngeal dysphagia noted. No change to vocal quality across trials. Oral phase notable for slowed mastication, with eventual complete clearance aided by thin liquid wash. Pt with intermittent belching during trials, with report of pain/lingering sensation in abdomin at completion of session (reports "like an ulcer is in my esophagus") Pt/spouse endorse hx of GERD and use of PPIs for management.   Based on current current concern for aspiration PNA and pt/spouse report of coughing with PO intake at home, MBSS is recommended for further assessment of pharyngeal stage of swallow function. Based on belching and globus sensation/discomfort with PO intake, recommend GI consult. Recommend continued regular solids (cut to aid mastication) and thin liquids. Current deconditioning, pulmonary/respiratory status, and GERD all increase pt's risk for aspiration- recommend aspiration precautions, including slow rate, small bites, elevated HOB (during and after intake), and resting in the setting of increased WOB. Pt/spouse aware of recommendations and reported understanding for education. MD and RN aware of plan.   SLP  Visit Diagnosis: Dysphagia, unspecified (R13.10)    Aspiration Risk  Mild aspiration risk    Diet Recommendation   Age appropriate regular;Thin  Medication Administration: Whole meds with puree (cut larger medications)    Other  Recommendations Recommended Consults: Consider GI evaluation Oral Care Recommendations: Oral care BID    Recommendations for follow up therapy are one component of a  multi-disciplinary discharge planning process, led by the attending physician.  Recommendations may be updated based on patient status, additional functional criteria and insurance authorization.  Follow up Recommendations Follow physician's recommendations for discharge plan and follow up therapies      Assistance Recommended at Discharge    Functional Status Assessment Patient has had a recent decline in their functional status and demonstrates the ability to make significant improvements in function in a reasonable and predictable amount of time.    Swallow Study   General Date of Onset: 07/04/23 HPI: Per H&P: "Erika [name] is a 52 y.o. female with medical history significant of multiple medical issues including chronic respiratory failure on 6 L, multivessel CAD status post CABG, carotid artery disease, CVA with chronic left-sided deficits, hypertension, peripheral artery disease status post stenting, type 2 diabetes, hyperlipidemia, HFpEF complex deep apnea syndrome presenting with acute on chronic respiratory failure with hypoxia, multifocal pneumonia with concern for aspiration, left-sided weakness... Noted recent admission at the end of June for similar issues including sepsis and aspiration pneumonia...CT of the chest with bibasilar patchy consolidative opacities concerning for multifocal pneumonia. Also with patulous fluid-filled esophagus and minimal layering debris concerning for intermittent recurrent aspiration." Pt currently on 6L O2 (consistent with home setting) and a regular solids and thin liquids diet. Type of Study: Bedside Swallow Evaluation Previous Swallow Assessment: MBSS completed in 2022 in CIR following CVA revealing moderate oral and mild pharyngeal dysphagia. At that time, SLP recommended Dys. 2 textures with thin liquids. Diet Prior to this Study: Regular;Thin liquids (Level 0) Temperature Spikes Noted: No (WBC 7.5) Respiratory Status: Nasal cannula  (6L) History of Recent Intubation: No Behavior/Cognition: Alert;Cooperative Oral Cavity Assessment: Within Functional Limits Oral Care Completed by SLP: Recent completion by staff Oral Cavity - Dentition: Adequate natural dentition Vision: Functional for self-feeding Self-Feeding Abilities: Able to feed self;Needs set up Patient Positioning: Upright in bed Baseline Vocal Quality: Normal Volitional Cough: Strong Volitional Swallow: Able to elicit    Oral/Motor/Sensory Function Overall Oral Motor/Sensory Function: Generalized oral weakness (mild L sided facial droop-subacute vs chronic) Facial Symmetry: Abnormal symmetry left   Ice Chips Ice chips: Not tested   Thin Liquid Thin Liquid: Within functional limits    Nectar Thick Nectar Thick Liquid: Not tested   Honey Thick Honey Thick Liquid: Not tested   Puree Puree: Within functional limits   Solid     Solid: Impaired Presentation: Self Fed Oral Phase Impairments: Impaired mastication Oral Phase Functional Implications: Impaired mastication Pharyngeal Phase Impairments:  (none)     [place] Jenafer Winterton Clapp  MS [facility] SLP   [place] J Clapp 07/04/2023,12:51 PM

## 2023-07-04 NOTE — Progress Notes (Signed)
[facility]  - [place] [facility]   PATIENT NAME: Erika [name]    MR#:  [id number]  DATE OF BIRTH:  1971/11/22  SUBJECTIVE:  husband at bedside. Patient mainly came in with increasing weakness on the left side. She has history of multiple strokes in the past with chronic left upper and lower extremity weakness. She also experience some shortness of breath and some trouble swallowing. She was recently here in June with pneumonia. No fever. Denies any cough. Worked with PT today and had orthostatic hypotension. She felt dizzy lightheaded. Has had history of labile hypertension in the past    VITALS:  Blood pressure (!) 122/59, pulse 63, temperature 97.9 F (36.6 C), resp. rate 17, height 5\' 6"  (1.676 m), weight 87 kg, SpO2 100%.  PHYSICAL EXAMINATION:   GENERAL:  52 y.o.-year-old patient with no acute distress. Obese obesity LUNGS: Normal breath sounds bilaterally, no wheezing CARDIOVASCULAR: S1, S2 normal. No murmur   ABDOMEN: Soft, nontender, nondistended. Bowel sounds present.  EXTREMITIES: No  edema b/l.    NEUROLOGIC: nonfocal  patient is alert and awake chronic left-sided hemiparesis SKIN: No obvious rash, lesion, or ulcer.   LABORATORY PANEL:  CBC Recent Labs  Lab 07/04/23 0326  WBC 7.5  HGB 8.0*  HCT 27.6*  PLT 445*    Chemistries  Recent Labs  Lab 07/04/23 0326  NA 140  K 3.9  CL 105  CO2 28  GLUCOSE 77  BUN 7  CREATININE 0.80  CALCIUM 9.2  AST 13*  ALT 9  ALKPHOS 52  BILITOT 0.3   Cardiac Enzymes No results for input(s): "TROPONINI" in the last 168 hours. RADIOLOGY:  MR BRAIN WO CONTRAST  Result Date: 07/03/2023 CLINICAL DATA:  History of CVA with residual left-sided weakness presents with multiple episodes of passing out. EXAM: MRI HEAD WITHOUT CONTRAST TECHNIQUE: Multiplanar, multiecho pulse sequences of the brain and surrounding structures were obtained without intravenous contrast. COMPARISON:  CT head 06/11/2023, brain  MRI 01/26/2022 FINDINGS: Brain: There is no acute intracranial hemorrhage, extra-axial fluid collection, or acute infarct. Background parenchymal volume is normal. Remote infarcts in the right parietal and occipital lobes are unchanged, with unchanged slight ex vacuo dilatation of the right lateral ventricle. Additional remote infarcts in the pons, left basal ganglia, and left thalamus are also unchanged. Gliosis oral and degeneration in the bilateral middle cerebellar peduncles is unchanged. Additional patchy FLAIR signal abnormality in the supratentorial white matter consistent with underlying chronic small-vessel ischemic change is stable, accelerated for age. The pituitary and suprasellar region are normal. There is no mass lesion. There is no mass effect or midline shift. Vascular: Normal flow voids. Skull and upper cervical spine: Normal marrow signal. Sinuses/Orbits: The paranasal sinuses are clear. The globes and orbits are unremarkable. Other: The mastoid air cells and middle ear cavities are clear. IMPRESSION: 1. No acute intracranial pathology. 2. Unchanged remote infarcts and background chronic small-vessel ischemic change as above. Electronically Signed   By: Lesia Hausen M.D.   On: 07/03/2023 15:32   CT Angio Chest PE W/Cm &/Or Wo Cm  Result Date: 07/03/2023 CLINICAL DATA:  Shortness of breath, history of COPD. Recently diagnosed with multifocal pneumonia in June 2024. EXAM: CT ANGIOGRAPHY CHEST WITH CONTRAST TECHNIQUE: Multidetector CT imaging of the chest was performed using the standard protocol during bolus administration of intravenous contrast. Multiplanar CT image reconstructions and MIPs were obtained to evaluate the vascular anatomy. RADIATION DOSE REDUCTION: This exam was performed according to the departmental dose-optimization  program which includes automated exposure control, adjustment of the mA and/or kV according to patient size and/or use of iterative reconstruction technique.  CONTRAST:  75mL OMNIPAQUE IOHEXOL 350 MG/ML SOLN COMPARISON:  Chest CT June 12, 2023 and May 29, 2023 FINDINGS: Cardiovascular: Satisfactory opacification of the pulmonary arteries to the segmental level. No evidence of pulmonary embolism. Normal heart size. No pericardial effusion. Severe coronary artery atherosclerosis. Thoracic aortic atherosclerosis. Status post median sternotomy and CABG. Mediastinum/Nodes: The thyroid gland is within normal limits. No axillary lymphadenopathy. Unchanged prominent mediastinal and hilar lymph nodes. An index right hilar lymph node measures up to 13 mm in short axis on series 4, image 64. Patulous and fluid-filled esophagus. Lungs/Pleura: Patent central airways. Minimal layering debris in the proximal trachea. Respiratory motion artifact slightly limits evaluation of the lung parenchyma. Bibasilar patchy consolidative opacities with air bronchograms, slightly decreased compared to June 12, 2023. No new focal pulmonary opacity. No pleural effusion or pneumothorax. Upper Abdomen: No acute abnormality. Musculoskeletal: No chest wall abnormality. No acute or significant osseous findings. Review of the MIP images confirms the above findings. IMPRESSION: 1. Bibasilar patchy consolidative opacities, decreased in extent since June 12, 2023, likely representing evolving multifocal pneumonia. 2. Patulous fluid-filled esophagus and minimal layering debris in the proximal trachea, raising concern for intermittent/recurrent aspiration. 3. Unchanged prominent mediastinal and hilar lymph nodes, likely reactive. Electronically Signed   By: Jacob Moores M.D.   On: 07/03/2023 12:56   DG Chest Port 1 View  Result Date: 07/03/2023 CLINICAL DATA:  Provided history: Questionable sepsis-evaluate for abnormality. Shortness of breath. Syncopal episode. EXAM: PORTABLE CHEST 1 VIEW COMPARISON:  Prior chest radiographs 06/24/2023 and earlier. FINDINGS: Prior median sternotomy/CABG. Redemonstrated  fractures within superior sternotomy wires. The cardiomediastinal silhouette is unchanged. Ill-defined opacity within the right lung base, progressed from the prior examination of 06/24/2023. No appreciable airspace consolidation on the left. No evidence of pleural effusion or pneumothorax. No acute osseous abnormality identified. IMPRESSION: Ill-defined opacity within the right lung base. This is progressed from the prior examination of 06/24/2023 and may reflect atelectasis and/or airspace consolidation. A superimposed component of scarring is possible. Electronically Signed   By: Jackey Loge D.O.   On: 07/03/2023 11:55    Assessment and Plan  Erika [name] is a 52 y.o. female with medical history significant of multiple medical issues including chronic respiratory failure on 6 L, multivessel CAD status post CABG, carotid artery disease, CVA with chronic left-sided deficits, hypertension, peripheral artery disease status post stenting, type 2 diabetes, hyperlipidemia, HFpEF complex deep apnea syndrome presenting with acute on chronic respiratory failure with hypoxia, multifocal pneumonia with concern for aspiration, left-sided weakness.   CT of the chest with bibasilar patchy consolidative opacities concerning for multifocal pneumonia. Also with patulous fluid-filled esophagus and minimal layering debris concerning for intermittent recurrent aspiration.   Acute on chronic respiratory failure with hypoxia suspected recurrent aspiration -- patient uses 6 L nasal cannula oxygen. She is setting hundred percent currently --IV unasyn for five days -- white count normal no fever, pro calcitonin within normal limits -- does not have productive cough at present. -- Appreciate speech therapy consultation. Modified barium swallow tomorrow. -- Discussed with patient and husband in the room to consider outpatient G.I. consultation.  Orthostatic hypotension ?? Autonomic dysfunction in the setting of  multiple stroke -- discontinue metoprolol for now --start Midodrine 5 mg tid -- continue oral hydration -- consider abdominal binder if needed and SCD  History of multiple strokes in the  past with left-sided deficit -- continue PT OT -- continue home antiplatelet regimen  Anemia of chronic disease Hemoglobin 8.5 on presentation - Baseline hemoglobin appears to be around 8.5-10.5 - No reported active bleeding - Transfuse for hemoglobin less than 7   CAD - Baseline history of CAD status post CABG 2007 - No active chest pain - Continue home regimen   Type 2 diabetes - SSI - Continue home basal insulin   HFpEF chronic on chronic home oxygen -2D echo February 2023 with a EF of 60 to 65% and grade 2 diastolic dysfunction - Mildly dry to euvolemic on exam - Status post fluid bolus in the setting of initial evaluation for sepsis in ER - Strict ins and outs and daily weights   hypertension - BP stable - Continue home regimen    Family communication : husband at bedside Consults : none CODE STATUS: full DVT Prophylaxis : Lovenox Level of care: Telemetry Medical Status is: Inpatient Remains inpatient appropriate because: acute on chronic respiratory failure    TOTAL TIME TAKING CARE OF THIS PATIENT: 35 minutes.  >50% time spent on counselling and coordination of care  Note: This dictation was prepared with Dragon dictation along with smaller phrase technology. Any transcriptional errors that result from this process are unintentional.  Enedina Finner M.D    [facility]   CC: Primary care physician; Eden Emms, NP

## 2023-07-04 NOTE — Consult Note (Signed)
Pharmacy Antibiotic Note  Erika [name] is a 52 y.o. female with PMH including chronic respiratory failure on 6L oxygen at baseline, multivessel CAD s/p CABG, carotid artery disease, CVA with left-sided deficits, HTN, PAD s/p stenting, DM, HLD, HFpEF admitted on 07/03/2023 with  acute on chronic respiratory failure secondary to pneumonia .  Pharmacy has been consulted for Unasyn dosing.  Plan:  Unasyn 3 g IV q6h  Height: 5\' 6"  (167.6 cm) Weight: 87 kg (191 lb 12.8 oz) IBW/kg (Calculated) : 59.3  Temp (24hrs), Avg:97.9 F (36.6 C), Min:97.7 F (36.5 C), Max:98.2 F (36.8 C)  Recent Labs  Lab 07/03/23 1126 07/04/23 0326  WBC 14.0* 7.5  CREATININE 0.92 0.80  LATICACIDVEN 1.3  --     Estimated Creatinine Clearance: 94.5 mL/min (by C-G formula based on SCr of 0.8 mg/dL).    Allergies  Allergen Reactions   Chantix [Varenicline Tartrate] Other (See Comments)    Feels "crazy" and angry     Antimicrobials this admission: Cefepime 7/10 >> 7/11 Vancomycin 7/10 >> 7/11 Ceftriaxone 7/11 x 1 Unasyn 7/12 >>  Dose adjustments this admission: [name]  Microbiology results: 7/10 BCx: NGTD 7/10 RVP: (-) 7/10 MRSA PCR: (-)  Thank you for allowing pharmacy to be a part of this patient's care.  Tressie Ellis 07/04/2023 3:50 PM

## 2023-07-04 NOTE — Progress Notes (Signed)
  Inpatient Rehab Admissions Coordinator :  Per therapy recommendations patient was screened for CIR candidacy by Ottie Glazier RN MSN. Patient is not yet at a level to tolerate the intensity required to pursue a CIR admit due to dizziness and BP issues. Patient may have the potential to progress to become a candidate. The CIR admissions team will follow and monitor for progress and place a Rehab Consult order if felt to be appropriate. Please contact me with any questions.  Ottie Glazier RN MSN Admissions Coordinator [phone]

## 2023-07-04 NOTE — Plan of Care (Signed)
Patient found to be orthostatic positive during day shift 7/11 with midodrine being prescribed. No other significant changes this shift.

## 2023-07-04 NOTE — Evaluation (Signed)
Physical Therapy Evaluation Patient Details Name: Erika [name] MRN: [id number] DOB: Dec 01, 1971 Today's Date: 07/04/2023  History of Present Illness  Pt is a 52 y.o. female presenting to hospital 07/03/23 with c/o SOB, weakness, and episodes of passing out.  Recently admitted and discharged from hospital following aspiration PNA.  Pt admitted with acute on chronic respiratory failure with hypoxia and PNA.  PMH includes h/o CVA with residual L sided weakness, htn, CAD s/p CABG, chronic respiratory failure on 4-6 L home O2, DM, HLD, HFpEF, OSA, PTSD, CABG.  Clinical Impression  Prior to recent medical concerns, pt was modified independent ambulating with hemi-walker within the home; typically uses 4L home O2 (using 6 L in last week); lives with her husband and 50 y.o. daughter in 1 level home with ramp to enter.  Pt reporting she has been having dizziness when getting OOB so therapist took orthostatics.  Supine BP 126/82 with HR 62 bpm; sitting BP 95/68 with HR 68 bpm (pt c/o dizziness but improved while sitting); and standing BP 87/59 with HR 68 bpm (pt c/o a lot of dizziness so pt assisted back to bed d/t safety concerns).  Pt's MD and nurse notified regarding pt's BP and symptoms.  During session pt was min assist with bed mobility and min assist x2 to stand up from bed.  Pt would currently benefit from skilled PT to address noted impairments and functional limitations (see below for any additional details).  Upon hospital discharge, pt would benefit from ongoing therapy.     Assistance Recommended at Discharge Frequent or constant Supervision/Assistance  If plan is discharge home, recommend the following:  Can travel by private vehicle  A lot of help with walking and/or transfers;A little help with bathing/dressing/bathroom;Assistance with cooking/housework;Assist for transportation;Help with stairs or ramp for entrance        Equipment Recommendations Other (comment) (pt has needed DME  at home already)  Recommendations for Other Services  OT consult    Functional Status Assessment Patient has had a recent decline in their functional status and demonstrates the ability to make significant improvements in function in a reasonable and predictable amount of time.     Precautions / Restrictions Precautions Precautions: Fall Precaution Comments: monitor BP (orthostatic) Restrictions Weight Bearing Restrictions: No      Mobility  Bed Mobility Overal bed mobility: Needs Assistance Bed Mobility: Supine to Sit, Sit to Supine     Supine to sit: Min assist, HOB elevated Sit to supine: Min assist   General bed mobility comments: assist for trunk semi-supine to sitting edge of bed; assist for LE's sit to semi-supine in bed    Transfers Overall transfer level: Needs assistance Equipment used: 1 person hand held assist Transfers: Sit to/from Stand Sit to Stand: Min assist, +2 physical assistance           General transfer comment: R UE support on bed rail; assist to initiate stand and control descent sitting    Ambulation/Gait               General Gait Details: deferred d/t pt's dizziness and pt's low BP in standing  Stairs            Wheelchair Mobility     Tilt Bed    Modified Rankin (Stroke Patients Only)       Balance Overall balance assessment: Needs assistance Sitting-balance support: No upper extremity supported, Feet supported Sitting balance-Leahy Scale: Good Sitting balance - Comments: steady reaching within BOS  Standing balance support: Single extremity supported Standing balance-Leahy Scale: Fair Standing balance comment: steady static standing with single UE support on bed rail                             Pertinent Vitals/Pain Pain Assessment Pain Assessment: 0-10 Pain Score: 9  Pain Location: cluster HA's (pt reports h/o these) Pain Descriptors / Indicators: Headache Pain Intervention(s): Limited  activity within patient's tolerance, Monitored during session, Premedicated before session, Repositioned (pt reports recent pain meds for HA)    Home Living Family/patient expects to be discharged to:: Private residence Living Arrangements: Spouse/significant other;Children (Pt's 33 y.o. daughter) Available Help at Discharge: Family;Available 24 hours/day Type of Home: House Home Access: Ramped entrance       Home Layout: One level Home Equipment: Grab bars - tub/shower;Shower seat - built in;Other (comment);Wheelchair - manual (hemi-walker) Additional Comments: Pt typically uses 4L home O2 chronic but has used 6L for past week.    Prior Function Prior Level of Function : Needs assist;History of Falls (last six months)             Mobility Comments: Modified independent ambulating with hemi-walker. ADLs Comments: Assist in/out of shower and set up (pt typically stands to shower)     Hand Dominance        Extremity/Trunk Assessment   Upper Extremity Assessment Upper Extremity Assessment:  (R UE WFL; able to flex L fingers but unable to extend (pt requiring time to relax fingers after trying to grip therapists fingers); no other active movement noted L UE (pt reports baseline))    Lower Extremity Assessment Lower Extremity Assessment: LLE deficits/detail (R LE WFL) LLE Deficits / Details: hip flexion 2+/5; knee extension 2+/5; DF 1/5; able to wiggle toes; increased tone noted L LE (pt reports from prior stroke)       Communication      Cognition Arousal/Alertness: Awake/alert Behavior During Therapy: [facility] for tasks assessed/performed; Anxious Overall Cognitive Status: Within Functional Limits for tasks assessed                                          General Comments  Nursing cleared pt for participation in physical therapy.  Pt agreeable to PT session.  Pt's husband present during session.    Exercises     Assessment/Plan    PT Assessment  Patient needs continued PT services  PT Problem List Decreased strength;Decreased activity tolerance;Decreased balance;Decreased mobility;Decreased knowledge of precautions;Cardiopulmonary status limiting activity;Pain       PT Treatment Interventions DME instruction;Gait training;Functional mobility training;Therapeutic activities;Therapeutic exercise;Balance training;Patient/family education    PT Goals (Current goals can be found in the Care Plan section)  Acute Rehab PT Goals Patient Stated Goal: to improve functional mobility PT Goal Formulation: With patient/family Time For Goal Achievement: 07/18/23 Potential to Achieve Goals: Good    Frequency Min 1X/week     Co-evaluation               AM-PAC PT "6 Clicks" Mobility  Outcome Measure Help needed turning from your back to your side while in a flat bed without using bedrails?: A Little Help needed moving from lying on your back to sitting on the side of a flat bed without using bedrails?: A Little Help needed moving to and from a bed to a chair (including a  wheelchair)?: A Lot Help needed standing up from a chair using your arms (e.g., wheelchair or bedside chair)?: A Lot Help needed to walk in hospital room?: Total Help needed climbing 3-5 steps with a railing? : Total 6 Click Score: 12    End of Session Equipment Utilized During Treatment: Gait belt;Oxygen (6 L via nasal cannula) Activity Tolerance: Other (comment) (Limited d/t low BP and dizziness in standing (nurse and MD notified)) Patient left: in bed;with call bell/phone within reach;with bed alarm set;with nursing/sitter in room;with family/visitor present Nurse Communication: Mobility status;Precautions;Other (comment) (pt's symptoms and BP during session) PT Visit Diagnosis: Other abnormalities of gait and mobility (R26.89);Muscle weakness (generalized) (M62.81);History of falling (Z91.81);Pain    Time: 1610-9604 PT Time Calculation (min) (ACUTE ONLY): 46  min   Charges:   PT Evaluation $PT Eval Low Complexity: 1 Low PT Treatments $Therapeutic Exercise: 8-22 mins $Therapeutic Activity: 8-22 mins PT General Charges $$ ACUTE PT VISIT: 1 Visit        Hendricks Limes, PT 07/04/23, 12:15 PM

## 2023-07-04 NOTE — Evaluation (Signed)
Occupational Therapy Evaluation Patient Details Name: Erika [name] MRN: [id number] DOB: [birth year] Today's Date: 07/04/2023   History of Present Illness Erika [name] is a 52 y.o. female presenting to hospital 07/03/23 with c/o SOB, weakness, and episodes of passing out.  Recently admitted and discharged from hospital following aspiration PNA.  Erika [name] admitted with acute on chronic respiratory failure with hypoxia and PNA.  PMH includes h/o CVA with residual L sided weakness, htn, CAD s/p CABG, chronic respiratory failure on 4-6 L home O2, DM, HLD, HFpEF, OSA, PTSD, CABG.   Clinical Impression   Erika [name] was seen for OT evaluation this date. Prior to hospital admission, Erika [name] was MODI for ADL's and ambulates 20+ feet with hemiwalker. Erika [name] lives with husband and daughter who are able to assist as needed. Erika [name] presents to acute OT demonstrating impaired ADL performance and functional mobility 2/2 decreased mobility, balance, and strength (See OT problem list for additional functional deficits). Erika [name] currently requires MIN A to stand from EOB. During transfer Erika [name] experienced LOB requiring MAX A to right. Erika [name] and spouse acknowledged that Erika [name]'s current presentation is not near baseline. Erika [name] would benefit from skilled OT services to address noted impairments and functional limitations (see below for any additional details) in order to maximize safety and independence while minimizing falls risk and caregiver burden. Anticipate the need for follow up OT services upon acute hospital DC.     Orthostatic Vitals  Sitting: BP 121/69 Standing: BP 79/54, MAP 63, HR 70bpm, +dizziness   Recommendations for follow up therapy are one component of a multi-disciplinary discharge planning process, led by the attending physician.  Recommendations may be updated based on patient status, additional functional criteria and insurance authorization.   Assistance Recommended at Discharge Intermittent Supervision/Assistance  Patient can  return home with the following A lot of help with walking and/or transfers    Functional Status Assessment  Patient has had a recent decline in their functional status and demonstrates the ability to make significant improvements in function in a reasonable and predictable amount of time.  Equipment Recommendations  Other (comment) (Defer to next venue of care)    Recommendations for Other Services       Precautions / Restrictions Precautions Precautions: Fall Precaution Comments: monitor BP (orthostatic) Restrictions Weight Bearing Restrictions: No      Mobility Bed Mobility Overal bed mobility: Needs Assistance Bed Mobility: Supine to Sit     Supine to sit: Min assist, HOB elevated     General bed mobility comments: Assist for trunk management    Transfers Overall transfer level: Needs assistance Equipment used: 1 person hand held assist Transfers: Bed to chair/wheelchair/BSC, Sit to/from Stand Sit to Stand: Min assist     Step pivot transfers: Min guard, Max assist     General transfer comment: Erika [name] experienced LOB while trasnfering to chair requiring MAX A to right. Erika [name] continued transfer with MIN A      Balance Overall balance assessment: Needs assistance Sitting-balance support: No upper extremity supported, Feet supported Sitting balance-Leahy Scale: Good     Standing balance support: Single extremity supported Standing balance-Leahy Scale: Poor Standing balance comment: steady static standing with single UE support on hemiwalker                           ADL either performed or assessed with clinical judgement   ADL Overall ADL's : Needs assistance/impaired  Toilet Transfer: Minimal assistance;Maximal assistance Toilet Transfer Details (indicate cue type and reason): MIN A for sit<>stand, Erika [name] lost balance requiring MAX A to right.         Functional mobility during ADLs: Minimal assistance General ADL  Comments: Anticipate MIN A to complete ADLs     Vision         Perception     Praxis      Pertinent Vitals/Pain Pain Assessment Pain Assessment: No/denies pain     Hand Dominance Right   Extremity/Trunk Assessment Upper Extremity Assessment Upper Extremity Assessment: LUE deficits/detail LUE Deficits / Details: History of CVA effecting LUE LUE Coordination: decreased fine motor;decreased gross motor   Lower Extremity Assessment Lower Extremity Assessment: Defer to Erika [name] evaluation       Communication Communication Communication: No difficulties   Cognition Arousal/Alertness: Awake/alert Behavior During Therapy: WFL for tasks assessed/performed Overall Cognitive Status: Within Functional Limits for tasks assessed                                 General Comments: Anxious over orthostatic results     General Comments       Exercises     Shoulder Instructions      Home Living Family/patient expects to be discharged to:: Private residence Living Arrangements: Spouse/significant other;Children Available Help at Discharge: Family;Available 24 hours/day Type of Home: House Home Access: Ramped entrance     Home Layout: One level     Bathroom Shower/Tub: Producer, television/film/video: Standard Bathroom Accessibility: Yes How Accessible: Accessible via walker Home Equipment: Grab bars - tub/shower;Shower seat - built in;Other (comment);Wheelchair - manual   Additional Comments: Erika [name] typically uses 4L home O2 chronic but has used 6L for past week.      Prior Functioning/Environment Prior Level of Function : Needs assist;History of Falls (last six months)       Physical Assist : Mobility (physical) Mobility (physical): Bed mobility;Transfers;Stairs   Mobility Comments: Modified independent ambulating with hemi-walker. ADLs Comments: Assist in/out of shower and set up (Erika [name] typically stands to shower)        OT Problem List: Decreased  activity tolerance;Impaired balance (sitting and/or standing)      OT Treatment/Interventions: Self-care/ADL training;Therapeutic exercise;Energy conservation;Therapeutic activities;Patient/family education    OT Goals(Current goals can be found in the care plan section) Acute Rehab OT Goals Patient Stated Goal: To feel better OT Goal Formulation: With patient/family Time For Goal Achievement: 07/18/23 Potential to Achieve Goals: Fair ADL Goals Erika [name] Will Perform Grooming: with modified independence;standing Erika [name] Will Transfer to Toilet: with modified independence;ambulating;grab bars;regular height toilet Erika [name] Will Perform Tub/Shower Transfer: with modified independence;grab bars  OT Frequency: Min 1X/week    Co-evaluation              AM-PAC OT "6 Clicks" Daily Activity     Outcome Measure Help from another person eating meals?: None Help from another person taking care of personal grooming?: None Help from another person toileting, which includes using toliet, bedpan, or urinal?: A Little Help from another person bathing (including washing, rinsing, drying)?: A Little Help from another person to put on and taking off regular upper body clothing?: None Help from another person to put on and taking off regular lower body clothing?: A Little 6 Click Score: 21   End of Session Equipment Utilized During Treatment: Gait belt;Oxygen (hemiwalker) Nurse Communication: Mobility status  Activity Tolerance: Patient tolerated treatment  well Patient left: in chair;with family/visitor present  OT Visit Diagnosis: Unsteadiness on feet (R26.81);Other abnormalities of gait and mobility (R26.89);Dizziness and giddiness (R42)                Time: 1610-9604 OT Time Calculation (min): 22 min Charges:  OT General Charges $OT Visit: 1 Visit OT Evaluation $OT Eval Moderate Complexity: 1 Mod [address], OTS

## 2023-07-05 ENCOUNTER — Inpatient Hospital Stay: Payer: BC Managed Care – PPO

## 2023-07-05 DIAGNOSIS — J9621 Acute and chronic respiratory failure with hypoxia: Secondary | ICD-10-CM | POA: Diagnosis not present

## 2023-07-05 LAB — GLUCOSE, CAPILLARY
Glucose-Capillary: 90 mg/dL (ref 70–99)
Glucose-Capillary: 90 mg/dL (ref 70–99)
Glucose-Capillary: 91 mg/dL (ref 70–99)

## 2023-07-05 MED ORDER — MIDODRINE HCL 5 MG PO TABS: 5.0000 mg | ORAL_TABLET | Freq: Three times a day (TID) | ORAL | 1 refills | Status: DC

## 2023-07-05 MED ORDER — AMOXICILLIN-POT CLAVULANATE 875-125 MG PO TABS
1.0000 | ORAL_TABLET | Freq: Two times a day (BID) | ORAL | Status: DC
  Administered 2023-07-05: 1 via ORAL
  Filled 2023-07-05: qty 1

## 2023-07-05 MED ORDER — ALBUTEROL SULFATE (2.5 MG/3ML) 0.083% IN NEBU: 2.5000 mg | INHALATION_SOLUTION | RESPIRATORY_TRACT | 2 refills | Status: DC | PRN

## 2023-07-05 MED ORDER — AMOXICILLIN-POT CLAVULANATE 875-125 MG PO TABS: 1.0000 | ORAL_TABLET | Freq: Two times a day (BID) | ORAL | 0 refills | Status: AC

## 2023-07-05 NOTE — Discharge Instructions (Addendum)
Speech therapy instrucitons-- Recommendations: PO diet PO Diet Recommendation: Regular;Thin liquids (Level 0) (foods/meats Cut Small and Moistened) Liquid Administration via: Cup;No straw Medication Administration: Whole meds with puree (baseline now) Supervision: Patient able to self-feed;Intermittent supervision/cueing for swallowing strategies;Set-up assistance for safety Swallowing strategies  : Minimize environmental distractions;Slow rate;Small bites/sips;Check for pocketing or oral holding;Check for anterior loss;Follow solids with liquids Postural changes: Position pt fully upright for meals;Stay upright 30-60 min after meals;Out of bed for meals Oral care recommendations: Oral care BID (2x/day);Pt independent with oral care (w/ setup support)

## 2023-07-05 NOTE — Progress Notes (Signed)
Occupational Therapy Treatment Patient Details Name: Erika [name] MRN: [id number] DOB: [birth year] Today's Date: 07/05/2023   History of present illness Pt is a 52 y.o. female presenting to hospital 07/03/23 with c/o SOB, weakness, and episodes of passing out.  Recently admitted and discharged from hospital following aspiration PNA.  Pt admitted with acute on chronic respiratory failure with hypoxia and PNA.  PMH includes h/o CVA with residual L sided weakness, htn, CAD s/p CABG, chronic respiratory failure on 4-6 L home O2, DM, HLD, HFpEF, OSA, PTSD, CABG.   OT comments  Pt seen for OT treatment on this date. Upon arrival to room pt resting in bed, agreeable to tx. Pt requires MIN A for bed mobility for trunk support. MIN A for transfer to recliner. Pt educated on exercises to perform prior to transfers to decrease effects of +orthostatics and dizziness. Pt return demonstrated with fair results. Pt making progress toward goals, will continue to follow POC. Discharge recommendation remains appropriate.   Orthostatic Vitals  Lying: BP 109/53 , MAP 70 , HR 55 Sitting: BP 91/53, MAP 67, HR 61 Standing: BP 76/47, MAP 57, HR 61    Recommendations for follow up therapy are one component of a multi-disciplinary discharge planning process, led by the attending physician.  Recommendations may be updated based on patient status, additional functional criteria and insurance authorization.    Assistance Recommended at Discharge Frequent or constant Supervision/Assistance  Patient can return home with the following  A lot of help with walking and/or transfers;A little help with bathing/dressing/bathroom;Assistance with cooking/housework;Assist for transportation;Help with stairs or ramp for entrance   Equipment Recommendations  BSC/3in1    Recommendations for Other Services      Precautions / Restrictions Precautions Precautions: Fall Precaution Comments: monitor BP  (orthostatic) Restrictions Weight Bearing Restrictions: No       Mobility Bed Mobility Overal bed mobility: Needs Assistance Bed Mobility: Supine to Sit     Supine to sit: Min assist, HOB elevated     General bed mobility comments: Hand held assist for trunk management    Transfers Overall transfer level: Needs assistance Equipment used: 1 person hand held assist Transfers: Bed to chair/wheelchair/BSC, Sit to/from Stand Sit to Stand: Min assist     Step pivot transfers: Min assist     General transfer comment: Verbal cues for stepping, pt reported feeling dizzy.     Balance Overall balance assessment: Needs assistance Sitting-balance support: No upper extremity supported, Feet supported Sitting balance-Leahy Scale: Good     Standing balance support: Single extremity supported Standing balance-Leahy Scale: Poor Standing balance comment: Orthostatic drop decreased standing tolerance                           ADL either performed or assessed with clinical judgement   ADL Overall ADL's : Needs assistance/impaired                                     Functional mobility during ADLs: Minimal assistance General ADL Comments: Anticipate MIN A to complete ADLs    Extremity/Trunk Assessment Upper Extremity Assessment Upper Extremity Assessment: LUE deficits/detail LUE Deficits / Details: History of CVA effecting LUE LUE Coordination: decreased fine motor;decreased gross motor   Lower Extremity Assessment Lower Extremity Assessment: Defer to PT evaluation        Vision       Perception  Praxis      Cognition Arousal/Alertness: Awake/alert Behavior During Therapy: WFL for tasks assessed/performed Overall Cognitive Status: Within Functional Limits for tasks assessed                                 General Comments: Anxious over orthostatic results        Exercises      Shoulder Instructions        General Comments      Pertinent Vitals/ Pain       Pain Assessment Pain Assessment: No/denies pain  Home Living                                          Prior Functioning/Environment              Frequency  Min 1X/week        Progress Toward Goals  OT Goals(current goals can now be found in the care plan section)  Progress towards OT goals: Progressing toward goals  Acute Rehab OT Goals Patient Stated Goal: To feel better OT Goal Formulation: With patient/family Time For Goal Achievement: 07/18/23 Potential to Achieve Goals: Fair ADL Goals Pt Will Perform Grooming: with modified independence;standing Pt Will Transfer to Toilet: with modified independence;ambulating;grab bars;regular height toilet Pt Will Perform Tub/Shower Transfer: with modified independence;grab bars  Plan Discharge plan needs to be updated    Co-evaluation                 AM-PAC OT "6 Clicks" Daily Activity     Outcome Measure   Help from another person eating meals?: None Help from another person taking care of personal grooming?: A Little Help from another person toileting, which includes using toliet, bedpan, or urinal?: A Lot Help from another person bathing (including washing, rinsing, drying)?: A Little Help from another person to put on and taking off regular upper body clothing?: None Help from another person to put on and taking off regular lower body clothing?: A Little 6 Click Score: 19    End of Session Equipment Utilized During Treatment: Gait belt;Oxygen  OT Visit Diagnosis: Unsteadiness on feet (R26.81);Other abnormalities of gait and mobility (R26.89);Dizziness and giddiness (R42)   Activity Tolerance Patient tolerated treatment well   Patient Left in chair;with family/visitor present   Nurse Communication Mobility status        Time: 1610-9604 OT Time Calculation (min): 25 min  Charges: OT General Charges $OT Visit: 1 Visit OT  Treatments $Self Care/Home Management : 8-22 mins $Therapeutic Activity: 8-22 mins  Thresa [name], OTS

## 2023-07-05 NOTE — Progress Notes (Signed)
Modified Barium Swallow Study  Patient Details  Name: Erika [name] MRN: [id number] Date of Birth: 1971-06-25  Today's Date: 07/05/2023  Modified Barium Swallow completed.  Full report located under Chart Review in the Imaging Section.  History of Present Illness Per H&P: "Erika [name] is a 52 y.o. female with medical history significant of multiple medical issues including chronic respiratory failure on 6 L, multivessel CAD status post CABG, carotid artery disease, R CVA with chronic left-sided deficits, hypertension, peripheral artery disease status post stenting, type 2 diabetes, hyperlipidemia, HFpEF complex deep apnea syndrome presenting with acute on chronic respiratory failure with hypoxia, multifocal pneumonia with concern for aspiration, left-sided weakness. Noted recent admission at the end of June for similar issues including sepsis and aspiration pneumonia.  CT of the chest with bibasilar patchy consolidative opacities concerning for multifocal pneumonia. Also with patulous fluid-filled esophagus and minimal layering debris concerning for intermittent recurrent aspiration.".   Pt currently on 6L O2 (consistent with home setting) and a regular solids and thin liquids diet - she states she drinks from a "Stanley cup w/ Straw" at home in a recliner where she sits much of the day per her report.   Clinical Impression Patient presents with moderate and chronic pharyngeal phase dysphagia w/ mild oral phase dysphagia. Similar was noted per MBSS 06/2021 s/p R CVA, w/ resulting L sided weakness.  Oral stage is characterized by inconsistnet lip closure w/ escape of thin liquids x2 and min increased bolus preparation and mastication time w/ solids. Bolus containment and anterior to posterior transit time was functional. Swallow initiation occurs at the level of the pyriform sinuses for thin liquids, spilling to the p.s. for nectar liquids, and the valleculae for remaining consistencies.   Pharyngeal stage is noted for functional tongue base retraction, adequate hyolaryngeal excursion, and adequate pharyngeal constriction. Epiglottic deflection is complete during timely pharyngeal swallows. HOWEVER, w/ thin liquid consistencies and the delay in pharyngeal swallow initiation, laryngeal penetration and aspiration(x1 trial) occurs d/t the decreased pharyngeal sensation and reduced timing of epiglottic inversion. This results in laryngeal penetration(to the cords x1) and aspiration(x1) w/ thins; coating along underneath side of the epiglottis w/ nectar liquids. Pt exhibited delayed sensation to the aspiration; no apparent sensation to the laryngeal penetration occurring. Instructed pt on using a throat/re-swallow to aid clearing of vestibule. No overt pharyngeal residue noted; pharyngeal stripping wave is complete. Amplitude/duration of cricopharyngeus opening is WFL. There is adequate/complete clearance through the cervical esophagus.   Consistencies tested were thin liquids x2 tsps, 2-3 sequential sips(x3), nectar x1 tsp, 1 cup sip, 2 sequential cup sips, honey x1 tsp, pudding x1 tsp, regular solid (1/2 graham cracker with pudding).    Recommend patient continue regular diet with meats/foods well-Cut and Moistened; thin liquids VIA CUP ONLY -- NO STRAWS. Instructed on throat clear-re-swallow when drinking liquids, and general aspiration precautions including moistening foods well fo rease of chewing and SMALL bites/sips. Educated pt verbally and in handout form re: strategy and precautions. Factors that may increase risk of adverse event in presence of aspiration Rubye Oaks & Clearance Coots 2021): Limited mobility (prior R CVA)   Swallow Evaluation Recommendations Recommendations: PO diet PO Diet Recommendation: Regular;Thin liquids (Level 0) (foods/meats Cut Small and Moistened) Liquid Administration via: Cup;No straw Medication Administration: Whole meds with puree (baseline now) Supervision:  Patient able to self-feed;Intermittent supervision/cueing for swallowing strategies;Set-up assistance for safety Swallowing strategies  : Minimize environmental distractions;Slow rate;Small bites/sips;Check for pocketing or oral holding;Check for anterior loss;Follow solids with liquids  Postural changes: Position pt fully upright for meals;Stay upright 30-60 min after meals;Out of bed for meals Oral care recommendations: Oral care BID (2x/day);Pt independent with oral care (w/ setup support) Recommended consults:  (Neurology) Caregiver Recommendations:  (avoid Mixed consistencies; Cream Soups recommended) F/u w/ further education re: dysphagia/precautions and support at next venue of care as indicated.         Jerilynn Som, MS, CCC-SLP Speech Language Pathologist Rehab Services; [facility] [phone] (ascom) Khalin Royce 07/05/2023,9:48 AM

## 2023-07-05 NOTE — Inpatient Diabetes Management (Signed)
Inpatient Diabetes Program Recommendations  AACE/ADA: New Consensus Statement on Inpatient Glycemic Control (2015)  Target Ranges:  Prepandial:   less than 140 mg/dL      Peak postprandial:   less than 180 mg/dL (1-2 hours)      Critically ill patients:  140 - 180 mg/dL    Latest Reference Range & Units 07/03/23 23:52 07/04/23 04:01 07/04/23 07:23 07/04/23 11:29 07/04/23 16:19 07/04/23 20:09  Glucose-Capillary 70 - 99 mg/dL 81 77 95 161 (H) 096 (H) 118 (H)  (H): Data is abnormally high  Latest Reference Range & Units 07/05/23 00:15 07/05/23 04:23 07/05/23 07:20  Glucose-Capillary 70 - 99 mg/dL 90 91 90      Home: Levemir 27 units QAM  Mounjaro Qweek  Dexcom G7 CGM    Current Orders: Levemir 18 units QPM     Novolog 0-9 units Q4H     Review of MAR showed pt has Refused Levemir insulin the last 2 nights.  Spoke with MD about pt's refusal and pt's current CBG levels.  Dr. Allena Katz asked me to d/c the Levemir for now.  Levemir d/c'd as ordered.  Will follow.   --Will follow patient during hospitalization--  Ambrose Finland RN, MSN, CDCES Diabetes Coordinator Inpatient Glycemic Control Team Team Pager: [phone] (8a-5p)

## 2023-07-05 NOTE — Discharge Summary (Signed)
Physician Discharge Summary   Patient: Erika [name] MRN: [id number] DOB: 1971/12/19  Admit date:     07/03/2023  Discharge date: 07/05/23  Discharge Physician: Enedina Finner   PCP: Eden Emms, NP   Recommendations at discharge:    F/u PCP in 1-2 weeks Your Metoprolol is on HOLD due to soft BP Please follow speech therapy recommendations  Discharge Diagnoses: Principal Problem:   Acute on chronic respiratory failure with hypoxia (HCC)    Erika [name] is a 52 y.o. female with medical history significant of multiple medical issues including chronic respiratory failure on 6 L, multivessel CAD status post CABG, carotid artery disease, CVA with chronic left-sided deficits, hypertension, peripheral artery disease status post stenting, type 2 diabetes, hyperlipidemia, HFpEF complex deep apnea syndrome presenting with acute on chronic respiratory failure with hypoxia, multifocal pneumonia with concern for aspiration, left-sided weakness.    CT of the chest with bibasilar patchy consolidative opacities concerning for multifocal pneumonia. Also with patulous fluid-filled esophagus and minimal layering debris concerning for intermittent recurrent aspiration.    Acute on chronic respiratory failure with hypoxia suspected recurrent aspiration -- patient uses 6 L nasal cannula oxygen. She is setting hundred percent currently --IV unasyn--change to po augmentin -- white count normal no fever, pro calcitonin within normal limits -- does not have productive cough at present. -- Appreciate speech therapy consultation. Modified barium swallow tomorrow. -- Discussed with patient and husband in the room to consider outpatient G.I. consultation. --MBSS She has a Delayed Swallow Initiation which allows the thin liquids to more consistently slip into her airway before/during the swallow = aspiration x1. But mostly laryngeal penetration occurring w/ strategies. Decreased pharyngeal sensation. No  real change to her diet; just precautions. Discussed at length with patient and her husband at bedside and they do voice understanding taking good precautions.   Orthostatic hypotension ?? Autonomic dysfunction in the setting of multiple stroke -- discontinue metoprolol for now --start Midodrine 5 mg tid -- continue oral hydration -- consider abdominal binder if needed and SCD -- patient and husband understands orthostatic hypotension and be careful at home getting out of bed and recliner on a daily basis. Follow-up with PCP and defer further increasing dose as outpatient.   History of multiple strokes in the past with left-sided deficit -- continue PT OT-- recommends acute inpatient rehab power patient and husband wants to go home. -- continue home antiplatelet regimen -- patient at baseline per husband sits in the recliner all day till he comes back from work and then helps her around use the bathroom/bedside commode. Patient lately has been using more bedside commode due to her weakness and dizzy spells. No falls at home. Per patient daughter also helps out at home.   Anemia of chronic disease Hemoglobin 8.5 on presentation - Baseline hemoglobin appears to be around 8.5-10.5 - No reported active bleeding - Transfuse for hemoglobin less than 7   CAD - Baseline history of CAD status post CABG 2007 - No active chest pain - Continue home regimen   Type 2 diabetes - SSI - pt reports not taking levemir. She takes [facility]   HFpEF chronic on chronic home oxygen -2D echo February 2023 with a EF of 60 to 65% and grade 2 diastolic dysfunction -  euvolemic on exam - Status post fluid bolus in the setting of initial evaluation for sepsis in ER - Strict ins and outs and daily weights   hypertension - BP stable - Continue home regimen  Overall remains stable. Patient is eager to go home. Her husband supports and okay to go home today. They decline inpatient rehab. Patient understands  soft blood pressure/orthostatic blood pressure could be a chronic issue. Did a lot of education regarding the same.     Family communication : husband at bedside Consults : none CODE STATUS: full DVT Prophylaxis : Lovenox     Pain control - [place] Controlled Substance Reporting System database was reviewed. and patient was instructed, not to drive, operate heavy machinery, perform activities at heights, swimming or participation in water activities or provide baby-sitting services while on Pain, Sleep and Anxiety Medications; until their outpatient Physician has advised to do so again. Also recommended to not to take more than prescribed Pain, Sleep and Anxiety Medications.  Disposition: Home Diet recommendation:  Discharge Diet Orders (From admission, onward)     Start     Ordered   07/05/23 0000  Diet - low sodium heart healthy        07/05/23 1204           Cardiac and Carb modified diet DISCHARGE MEDICATION: Allergies as of 07/05/2023       Reactions   Chantix [varenicline Tartrate] Other (See Comments)   Feels "crazy" and angry        Medication List     STOP taking these medications    insulin detemir 100 UNIT/ML injection Commonly known as: LEVEMIR   metoprolol succinate 25 MG 24 hr tablet Commonly known as: TOPROL-XL       TAKE these medications    acetaminophen 325 MG tablet Commonly known as: TYLENOL Take 1-2 tablets (325-650 mg total) by mouth every 4 (four) hours as needed for mild pain.   albuterol (2.5 MG/3ML) 0.083% nebulizer solution Commonly known as: PROVENTIL Take 3 mLs (2.5 mg total) by nebulization every 4 (four) hours as needed for wheezing or shortness of breath.   amoxicillin-clavulanate 875-125 MG tablet Commonly known as: AUGMENTIN Take 1 tablet by mouth every 12 (twelve) hours for 5 days.   aspirin EC 81 MG tablet Take 1 tablet (81 mg total) by mouth daily. Swallow whole.   chlorproMAZINE 25 MG tablet Commonly known  as: THORAZINE Take 3 tablets (75 mg total) by mouth at bedtime.   clonazePAM 0.5 MG tablet Commonly known as: KlonoPIN Take 0.5 tablets (0.25 mg total) by mouth 2 (two) times daily. Take 1/2 tab twice daily. No dissolving tablets.   clopidogrel 75 MG tablet Commonly known as: PLAVIX Take 1 tablet (75 mg total) by mouth daily.   cyclobenzaprine 10 MG tablet Commonly known as: FLEXERIL TAKE ONE TABLET BY MOUTH AT BEDTIME   Dexcom G7 Sensor Misc APPLY ONE SENSOR TO THE BACK OF YOUR UPPER ARM. REPLACE EVERY 10 DAYS.   escitalopram 20 MG tablet Commonly known as: Lexapro Take 1 tablet (20 mg total) by mouth daily.   icosapent Ethyl 1 g capsule Commonly known as: VASCEPA Take 2 g by mouth 2 (two) times daily.   lamoTRIgine 200 MG tablet Commonly known as: LAMICTAL Take 1 tablet (200 mg total) by mouth at bedtime.   lamoTRIgine 25 MG tablet Commonly known as: LaMICtal Take 1 tablet (25 mg total) by mouth daily.   midodrine 5 MG tablet Commonly known as: PROAMATINE Take 1 tablet (5 mg total) by mouth 3 (three) times daily with meals.   Mounjaro 7.5 MG/0.5ML Pen Generic drug: tirzepatide INJECT THE CONTENTS OF ONE PEN UNDER THE SKIN WEEKLY ON THE SAME  DAY EACH WEEK   nitroGLYCERIN 0.4 MG SL tablet Commonly known as: NITROSTAT Place 1 tablet (0.4 mg total) under the tongue every 5 (five) minutes as needed for chest pain.   Oxycodone HCl 10 MG Tabs Take 1 tablet (10 mg total) by mouth 2 (two) times daily as needed.   pantoprazole 40 MG tablet Commonly known as: PROTONIX TAKE ONE TABLET BY MOUTH ONE TIME DAILY   polyethylene glycol 17 g packet Commonly known as: MIRALAX / GLYCOLAX Take 17 g by mouth 2 (two) times daily.   pregabalin 225 MG capsule Commonly known as: LYRICA Take 1 capsule (225 mg total) by mouth 2 (two) times daily.   Repatha SureClick 140 MG/ML Soaj Generic drug: Evolocumab Inject 140 mg into the skin as directed. Inject 1 Dose into the skin every  14 (fourteen) days.   senna 8.6 MG Tabs tablet Commonly known as: SENOKOT Take 1 tablet (8.6 mg total) by mouth daily.   Stiolto Respimat 2.5-2.5 MCG/ACT Aers Generic drug: Tiotropium Bromide-Olodaterol Inhale 2 puffs into the lungs daily.   topiramate 25 MG tablet Commonly known as: Topamax Take 1 tablet (25 mg total) by mouth at bedtime.   UltiCare Mini Pen Needles 31G X 6 MM Misc Generic drug: Insulin Pen Needle USE PEN NEEDLES TWO TIMES DAILY               Discharge Care Instructions  (From admission, onward)           Start     Ordered   07/05/23 0000  Discharge wound care:       Comments: Pressure Injury 01/26/22 Sacrum Right Stage 2 -  Partial thickness loss of dermis presenting as a shallow open injury with a red, pink wound bed without slough--foam pad   07/05/23 1204            Follow-up Information     Eden Emms, NP. Schedule an appointment as soon as possible for a visit in 1 week(s).   Specialties: Nurse Practitioner, Family Medicine Why: hopsital f/u Contact information: [address] [place] [address] [phone]                Discharge Exam: Ceasar Mons Weights   07/03/23 1120  Weight: 87 kg   GENERAL:  52 y.o.-year-old patient with no acute distress. Obese obesity LUNGS: Normal breath sounds bilaterally, no wheezing CARDIOVASCULAR: S1, S2 normal. No murmur   ABDOMEN: Soft, nontender, nondistended. Bowel sounds present.  EXTREMITIES: No  edema b/l.    NEUROLOGIC: nonfocal  patient is alert and awake chronic left-sided hemiparesis SKIN: No obvious rash, lesion, or ulcer.   Condition at discharge: fair  The results of significant diagnostics from this hospitalization (including imaging, microbiology, ancillary and laboratory) are listed below for reference.   Imaging Studies: DG Swallowing Func-Speech Pathology  Result Date: 07/05/2023 Table formatting from the original result was not included. Modified Barium  Swallow Study Patient Details Name: ROEN CRUSH MRN: [id number] Date of Birth: August 04, 1971 Today's Date: 07/05/2023 HPI/PMH: HPI: Per H&P: "VARIE OLLIFF is a 52 y.o. female with medical history significant of multiple medical issues including chronic respiratory failure on 6 L, multivessel CAD status post CABG, carotid artery disease, R CVA with chronic left-sided deficits, hypertension, peripheral artery disease status post stenting, type 2 diabetes, hyperlipidemia, HFpEF complex deep apnea syndrome presenting with acute on chronic respiratory failure with hypoxia, multifocal pneumonia with concern for aspiration, left-sided weakness. Noted recent admission at the end  of June for similar issues including sepsis and aspiration pneumonia.  CT of the chest with bibasilar patchy consolidative opacities concerning for multifocal pneumonia. Also with patulous fluid-filled esophagus and minimal layering debris concerning for intermittent recurrent aspiration.".  Pt currently on 6L O2 (consistent with home setting) and a regular solids and thin liquids diet - she states she drinks from a "Stanley cup w/ Straw" at home in a recliner where she sits much of the day per her report. Clinical Impression: Patient presents with moderate and chronic pharyngeal phase dysphagia w/ mild oral phase dysphagia. Similar was noted per MBSS 06/2021 s/p R CVA, w/ resulting L sided weakness. Oral stage is characterized by inconsistnet lip closure w/ escape of thin liquids x2 and min increased bolus preparation and mastication time w/ solids. Bolus containment and anterior to posterior transit time was functional. Swallow initiation occurs at the level of the pyriform sinuses for thin liquids, spilling to the p.s. for nectar liquids, and the valleculae for remaining consistencies. Pharyngeal stage is noted for functional tongue base retraction, adequate hyolaryngeal excursion, and adequate pharyngeal constriction. Epiglottic  deflection is complete during timely pharyngeal swallows. HOWEVER, w/ thin liquid consistencies and the delay in pharyngeal swallow initiation, laryngeal penetration and aspiration(x1 trial) occurs d/t the decreased pharyngeal sensation and reduced timing of epiglottic inversion. This results in laryngeal penetration(to the cords x1) and aspiration(x1) w/ thins; coating along underneath side of the epiglottis w/ nectar liquids. Pt exhibited delayed sensation to the aspiration; no apparent sensation to the laryngeal penetration occurring. Instructed pt on using a throat/re-swallow to aid clearing of vestibule. No overt pharyngeal residue noted; pharyngeal stripping wave is complete. Amplitude/duration of cricopharyngeus opening is WFL. There is adequate/complete clearance through the cervical esophagus. Consistencies tested were thin liquids x2 tsps, 2-3 sequential sips(x3), nectar x1 tsp, 1 cup sip, 2 sequential cup sips, honey x1 tsp, pudding x1 tsp, regular solid (1/2 graham cracker with pudding).  Recommend patient continue regular diet with meats/foods well-Cut and Moistened; thin liquids VIA CUP ONLY -- NO STRAWS. Instructed on throat clear-re-swallow when drinking liquids, and general aspiration precautions including moistening foods well fo rease of chewing and SMALL bites/sips. Educated pt verbally and in handout form re: strategy and precautions. Factors that may increase risk of adverse event in presence of aspiration Erika [name] & Clearance Coots 2021): Factors that may increase risk of adverse event in presence of aspiration Erika [name] & Clearance Coots 2021): Limited mobility (prior R CVA) Recommendations/Plan: Swallowing Evaluation Recommendations Swallowing Evaluation Recommendations Recommendations: PO diet PO Diet Recommendation: Regular; Thin liquids (Level 0) (foods/meats Cut Small and Moistened) Liquid Administration via: Cup; No straw Medication Administration: Whole meds with puree (baseline now) Supervision: Patient  able to self-feed; Intermittent supervision/cueing for swallowing strategies; Set-up assistance for safety Swallowing strategies  : Minimize environmental distractions; Slow rate; Small bites/sips; Check for pocketing or oral holding; Check for anterior loss; Follow solids with liquids Postural changes: Position pt fully upright for meals; Stay upright 30-60 min after meals; Out of bed for meals Oral care recommendations: Oral care BID (2x/day); Pt independent with oral care (w/ setup support) Recommended consults: -- (Neurology) Caregiver Recommendations: -- (avoid Mixed consistencies; Cream Soups recommended) F/u w/ education on dysphagia/precautions as recommended and support at next venue of care as indicated/desired. Treatment Plan Treatment Plan Treatment recommendations: Defer treatment plan to SLP at other venue (see follow-up recommendations) Follow-up recommendations: Home health SLP (next venue of care if desired) Recommendations Comment: ongoing education as needed Functional status assessment: Patient has had  a recent decline in their functional status and/or demonstrates limited ability to make significant improvements in function in a reasonable and predictable amount of time. Treatment frequency: -- (n/a) Treatment duration: -- (n/a) Interventions: Aspiration precaution training; Patient/family education Recommendations Recommendations for follow up therapy are one component of a multi-disciplinary discharge planning process, led by the attending physician.  Recommendations may be updated based on patient status, additional functional criteria and insurance authorization. Assessment: Orofacial Exam: Orofacial Exam Oral Cavity: Oral Hygiene: WFL Oral Cavity - Dentition: Dentures, top; Dentures, bottom Orofacial Anatomy: WFL Oral Motor/Sensory Function: Suspected cranial nerve impairment (slight) CN V - Trigeminal: Left sensory impairment CN VII - Facial: Left motor impairment CN IX - Glossopharyngeal,  CN X - Vagus: Left motor impairment CN XII - Hypoglossal: Left motor impairment Anatomy: Anatomy: WFL Boluses Administered: Boluses Administered Boluses Administered: Thin liquids (Level 0); Mildly thick liquids (Level 2, nectar thick); Moderately thick liquids (Level 3, honey thick); Puree; Solid  Oral Impairment Domain: Oral Impairment Domain Lip Closure: Escape progressing to mid-chin (thins) Tongue control during bolus hold: Cohesive bolus between tongue to palatal seal Bolus preparation/mastication: Slow prolonged chewing/mashing with complete recollection (w/ solid) Bolus transport/lingual motion: Delayed initiation of tongue motion (oral holding) (x1) Oral residue: Trace residue lining oral structures (w/ solid then cleared w/ lingual sweep and f/u swallow) Location of oral residue : Tongue (w/ solid) Initiation of pharyngeal swallow : Pyriform sinuses (w/ thins, spilling to w/ Nectar; Valleculae w/ puree/solid)  Pharyngeal Impairment Domain: Pharyngeal Impairment Domain Soft palate elevation: No bolus between soft palate (SP)/pharyngeal wall (PW) Laryngeal elevation: Complete superior movement of thyroid cartilage with complete approximation of arytenoids to epiglottic petiole Anterior hyoid excursion: Complete anterior movement Epiglottic movement: Complete inversion Laryngeal vestibule closure: Incomplete, narrow column air/contrast in laryngeal vestibule Pharyngeal stripping wave : Present - complete Pharyngeal contraction (A/P view only): [name] Pharyngoesophageal segment opening: Complete distension and complete duration, no obstruction of flow Tongue base retraction: No contrast between tongue base and posterior pharyngeal wall (PPW) Pharyngeal residue: Complete pharyngeal clearance Location of pharyngeal residue: [name]  Esophageal Impairment Domain: Esophageal Impairment Domain Esophageal clearance upright position: Complete clearance, esophageal coating Pill: Pill Consistency administered: -- (NT)  Penetration/Aspiration Scale Score: Penetration/Aspiration Scale Score 1.  Material does not enter airway: Moderately thick liquids (Level 3, honey thick); Puree; Solid 2.  Material enters airway, remains ABOVE vocal cords then ejected out: Mildly thick liquids (Level 2, nectar thick) (w/ swallow) 3.  Material enters airway, remains ABOVE vocal cords and not ejected out: Thin liquids (Level 0) 6.  Material enters airway, passes BELOW cords then ejected out: Thin liquids (Level 0) (x1 post spontaneous cough) Compensatory Strategies: Compensatory Strategies Compensatory strategies: Yes Other(comment): -- (throat clear/swallow post laryngeal penetration of thins appeared to aid clearing of vestibule)   General Information: Caregiver present: No  Diet Prior to this Study: Regular; Thin liquids (Level 0) (w/ straw use)   Temperature : Normal (WBC not elevated)   Respiratory Status: WFL   Supplemental O2: Nasal cannula (6L)   History of Recent Intubation: No  Behavior/Cognition: Alert; Cooperative; Pleasant mood (slightly impulsive) Self-Feeding Abilities: Able to self-feed; Needs set-up for self-feeding; Needs assist with self-feeding (Supervision beneficial) Baseline vocal quality/speech: Normal (slight dysarthria) Volitional Cough: Able to elicit Volitional Swallow: Able to elicit Exam Limitations: No limitations Goal Planning: Prognosis for improved oropharyngeal function: Fair Barriers to Reach Goals: Time post onset; Severity of deficits; Behavior Barriers/Prognosis Comment: Prior R CVA w/ L sided weakness Patient/Family Stated Goal:  to drink my coffee Consulted and agree with results and recommendations: Patient; Physician; Nurse Pain: Pain Assessment Pain Assessment: No/denies pain Pain Score: 9 Breathing: 0 Negative Vocalization: 0 Facial Expression: 0 Body Language: 0 Consolability: 0 PAINAD Score: 0 Pain Location: cluster HA's (pt reports h/o these) Pain Descriptors / Indicators: Headache Pain Intervention(s):  Limited activity within patient's tolerance; Monitored during session; Premedicated before session; Repositioned (pt reports recent pain meds for HA) End of Session: Start Time:SLP Start Time (ACUTE ONLY): 0810 Stop Time: SLP Stop Time (ACUTE ONLY): 0930 Time Calculation:SLP Time Calculation (min) (ACUTE ONLY): 80 min Charges: SLP Evaluations $ SLP Speech Visit: 1 Visit SLP Evaluations $BSS Swallow: 1 Procedure $MBS Swallow: 1 Procedure SLP visit diagnosis: SLP Visit Diagnosis: Dysphagia, oropharyngeal phase (R13.12) Past Medical History: Past Medical History: Diagnosis Date  Acute respiratory failure with hypoxia (HCC) 06/24/2021  Arthralgia of temporomandibular joint   CAD, multiple vessel   a. 06/2016 Cath: ostLM 40%, ostLAD 40%, pLAD 95%, ost-pLCx 60%, pLCx 95%, mLCx 60%, mRCA 95%, D2 50%, LVSF nl;  b. 07/2016 CABG x 4 (LIMA->LAD, VG->Diag, VG->OM, VG->RCA); c. 08/2016 Cath: 3VD w/ 4/4 patent grafts. LAD distal to LIMA has diff dzs->Med rx; d. 08/2020 Cath: 4/4 patent grafts, native 3VD. EF 55-65%-->Med Rx.  Carotid arterial disease (HCC)   a. 07/2016 s/p R CEA; b. 02/2021 U/S: RICA 40-59%, LICA 1-39%.  Cerebrovascular disease   Clotting disorder (HCC)   Complex sleep apnea syndrome 10/03/2022  Depression   Diastolic dysfunction   a. 06/2016 Echo: EF 50-55%, mild inf wall HK, GR1DD, mild MR, RV sys fxn nl, mildly dilated LA, PASP nl; b. 06/2021 Echo: EF 60-65%, no rwma. Nl RV fxn.  Fatty liver disease, nonalcoholic 2016  History of blood transfusion   with heart surgery  HLD (hyperlipidemia)   Labile hypertension   a. prior renal ngiogram negative for RAS in 03/2016; b. catecholamines and metanephrines normal, mildly elevated renin with normal aldosterone and normal ratio in 02/2016  Myocardial infarction [facility]) 2017  Obesity   PAD (peripheral artery disease) (HCC)   a. 09/2018 s/p L SFA stenting; b. 07/2019 Periph Angio: Patent m/d L SFA stent w/ 100% L SFA distal to stent. L AT 100d, L Peroneal diff dzs-->Med Rx; c.  02/2021 ABIs: stable @ 0.61 on R and 0.46 on L.  PTSD (post-traumatic stress disorder)   Stroke (HCC)   Tobacco abuse   Type 2 diabetes mellitus (HCC) 12/2015 Past Surgical History: Past Surgical History: Procedure Laterality Date  ABDOMINAL AORTOGRAM W/LOWER EXTREMITY [name] 10/15/2018  Procedure: ABDOMINAL AORTOGRAM W/LOWER EXTREMITY;  Surgeon: Iran Ouch, MD;  Location: [facility];  Service: Cardiovascular;  Laterality: [name];  ABDOMINAL AORTOGRAM W/LOWER EXTREMITY Bilateral 08/19/2019  Procedure: ABDOMINAL AORTOGRAM W/LOWER EXTREMITY;  Surgeon: Iran Ouch, MD;  Location: [facility];  Service: Cardiovascular;  Laterality: Bilateral;  CARDIAC CATHETERIZATION [name] 06/29/2016  Procedure: Left Heart Cath and Coronary Angiography;  Surgeon: Antonieta Iba, MD;  Location: [facility];  Service: Cardiovascular;  Laterality: [name];  CARDIAC CATHETERIZATION [name] 08/29/2016  Procedure: Left Heart Cath and Cors/Grafts Angiography;  Surgeon: Iran Ouch, MD;  Location: [facility];  Service: Cardiovascular;  Laterality: [name];  CESAREAN SECTION    CHOLECYSTECTOMY    CORONARY ARTERY BYPASS GRAFT [name] 07/06/2016  Procedure: CORONARY ARTERY BYPASS GRAFTING (CABG) x four, using left internal mammary artery and right leg greater saphenous vein harvested endoscopically;  Surgeon: Kerin Perna, MD;  Location: [facility]  OR;  Service: Open Heart Surgery;  Laterality: [name];  ENDARTERECTOMY Right 07/06/2016  Procedure: ENDARTERECTOMY CAROTID;  Surgeon: Larina Earthly, MD;  Location: [facility] OR;  Service: Vascular;  Laterality: Right;  ENDARTERECTOMY Right 04/27/2020  Procedure: REDO OF RIGHT ENDARTERECTOMY CAROTID;  Surgeon: Larina Earthly, MD;  Location: [facility] OR;  Service: Vascular;  Laterality: Right;  LEFT HEART CATH AND CORS/GRAFTS ANGIOGRAPHY [name] 08/24/2020  Procedure: LEFT HEART CATH AND CORS/GRAFTS ANGIOGRAPHY;  Surgeon: Iran Ouch, MD;  Location: [facility];  Service: Cardiovascular;  Laterality:  [name];  LEFT HEART CATH AND CORS/GRAFTS ANGIOGRAPHY [name] 01/29/2022  Procedure: LEFT HEART CATH AND CORS/GRAFTS ANGIOGRAPHY;  Surgeon: Iran Ouch, MD;  Location: [facility];  Service: Cardiovascular;  Laterality: [name];  PERIPHERAL VASCULAR CATHETERIZATION [name] 04/18/2016  Procedure: Renal Angiography;  Surgeon: Iran Ouch, MD;  Location: [facility];  Service: Cardiovascular;  Laterality: [name];  PERIPHERAL VASCULAR INTERVENTION Left 10/15/2018  Procedure: PERIPHERAL VASCULAR INTERVENTION;  Surgeon: Iran Ouch, MD;  Location: [facility];  Service: Cardiovascular;  Laterality: Left;  Left superficial femoral  TEE WITHOUT CARDIOVERSION [name] 07/06/2016  Procedure: TRANSESOPHAGEAL ECHOCARDIOGRAM (TEE);  Surgeon: Kerin Perna, MD;  Location: [facility] OR;  Service: Open Heart Surgery;  Laterality: [name];  TONSILLECTOMY   Jerilynn Som, MS, CCC-SLP Speech Language Pathologist Rehab Services; [facility] [phone] (ascom) Watson,Katherine 07/05/2023, 9:50 AM  MR BRAIN WO CONTRAST  Result Date: 07/03/2023 CLINICAL DATA:  History of CVA with residual left-sided weakness presents with multiple episodes of passing out. EXAM: MRI HEAD WITHOUT CONTRAST TECHNIQUE: Multiplanar, multiecho pulse sequences of the brain and surrounding structures were obtained without intravenous contrast. COMPARISON:  CT head 06/11/2023, brain MRI 01/26/2022 FINDINGS: Brain: There is no acute intracranial hemorrhage, extra-axial fluid collection, or acute infarct. Background parenchymal volume is normal. Remote infarcts in the right parietal and occipital lobes are unchanged, with unchanged slight ex vacuo dilatation of the right lateral ventricle. Additional remote infarcts in the pons, left basal ganglia, and left thalamus are also unchanged. Gliosis oral and degeneration in the bilateral middle cerebellar peduncles is unchanged. Additional patchy FLAIR signal abnormality in the supratentorial white matter  consistent with underlying chronic small-vessel ischemic change is stable, accelerated for age. The pituitary and suprasellar region are normal. There is no mass lesion. There is no mass effect or midline shift. Vascular: Normal flow voids. Skull and upper cervical spine: Normal marrow signal. Sinuses/Orbits: The paranasal sinuses are clear. The globes and orbits are unremarkable. Other: The mastoid air cells and middle ear cavities are clear. IMPRESSION: 1. No acute intracranial pathology. 2. Unchanged remote infarcts and background chronic small-vessel ischemic change as above. Electronically Signed   By: Lesia Hausen M.D.   On: 07/03/2023 15:32   CT Angio Chest PE W/Cm &/Or Wo Cm  Result Date: 07/03/2023 CLINICAL DATA:  Shortness of breath, history of COPD. Recently diagnosed with multifocal pneumonia in June 2024. EXAM: CT ANGIOGRAPHY CHEST WITH CONTRAST TECHNIQUE: Multidetector CT imaging of the chest was performed using the standard protocol during bolus administration of intravenous contrast. Multiplanar CT image reconstructions and MIPs were obtained to evaluate the vascular anatomy. RADIATION DOSE REDUCTION: This exam was performed according to the departmental dose-optimization program which includes automated exposure control, adjustment of the mA and/or kV according to patient size and/or use of iterative reconstruction technique. CONTRAST:  75mL OMNIPAQUE IOHEXOL 350 MG/ML SOLN COMPARISON:  Chest CT June 12, 2023 and May 29, 2023 FINDINGS:  Cardiovascular: Satisfactory opacification of the pulmonary arteries to the segmental level. No evidence of pulmonary embolism. Normal heart size. No pericardial effusion. Severe coronary artery atherosclerosis. Thoracic aortic atherosclerosis. Status post median sternotomy and CABG. Mediastinum/Nodes: The thyroid gland is within normal limits. No axillary lymphadenopathy. Unchanged prominent mediastinal and hilar lymph nodes. An index right hilar lymph node  measures up to 13 mm in short axis on series 4, image 64. Patulous and fluid-filled esophagus. Lungs/Pleura: Patent central airways. Minimal layering debris in the proximal trachea. Respiratory motion artifact slightly limits evaluation of the lung parenchyma. Bibasilar patchy consolidative opacities with air bronchograms, slightly decreased compared to June 12, 2023. No new focal pulmonary opacity. No pleural effusion or pneumothorax. Upper Abdomen: No acute abnormality. Musculoskeletal: No chest wall abnormality. No acute or significant osseous findings. Review of the MIP images confirms the above findings. IMPRESSION: 1. Bibasilar patchy consolidative opacities, decreased in extent since June 12, 2023, likely representing evolving multifocal pneumonia. 2. Patulous fluid-filled esophagus and minimal layering debris in the proximal trachea, raising concern for intermittent/recurrent aspiration. 3. Unchanged prominent mediastinal and hilar lymph nodes, likely reactive. Electronically Signed   By: Jacob Moores M.D.   On: 07/03/2023 12:56   DG Chest Port 1 View  Result Date: 07/03/2023 CLINICAL DATA:  Provided history: Questionable sepsis-evaluate for abnormality. Shortness of breath. Syncopal episode. EXAM: PORTABLE CHEST 1 VIEW COMPARISON:  Prior chest radiographs 06/24/2023 and earlier. FINDINGS: Prior median sternotomy/CABG. Redemonstrated fractures within superior sternotomy wires. The cardiomediastinal silhouette is unchanged. Ill-defined opacity within the right lung base, progressed from the prior examination of 06/24/2023. No appreciable airspace consolidation on the left. No evidence of pleural effusion or pneumothorax. No acute osseous abnormality identified. IMPRESSION: Ill-defined opacity within the right lung base. This is progressed from the prior examination of 06/24/2023 and may reflect atelectasis and/or airspace consolidation. A superimposed component of scarring is possible. Electronically  Signed   By: Jackey Loge D.O.   On: 07/03/2023 11:55   DG Chest 2 View  Result Date: 06/24/2023 CLINICAL DATA:  Follow-up pneumonia EXAM: CHEST - 2 VIEW COMPARISON:  Chest x-ray dated June 12, 2023 FINDINGS: Cardiac and mediastinal contours are unchanged. Prior median sternotomy with unchanged fractures of the superior wires. Interval improved aeration of the right lung base. Medial right lower lobe opacity has been present on multiple priors and is likely due to scarring. No evidence of pleural effusion or pneumothorax. IMPRESSION: 1. Interval improved aeration of the right lung base. 2. Persistent medial right lower lobe opacity which has been present on multiple priors and is likely due to scarring. Follow-up chest CT could be performed for better evaluation. Electronically Signed   By: Allegra Lai M.D.   On: 06/24/2023 17:20   CT CHEST WO CONTRAST  Result Date: 06/12/2023 CLINICAL DATA:  Respiratory illness.  Shortness of breath EXAM: CT CHEST WITHOUT CONTRAST TECHNIQUE: Multidetector CT imaging of the chest was performed following the standard protocol without IV contrast. RADIATION DOSE REDUCTION: This exam was performed according to the departmental dose-optimization program which includes automated exposure control, adjustment of the mA and/or kV according to patient size and/or use of iterative reconstruction technique. COMPARISON:  X-ray earlier 06/12/2023.  CT 05/29/2023 FINDINGS: Cardiovascular: Status post median sternotomy. Coronary artery calcifications are seen. The thoracic aorta has a overall normal course and caliber with calcified plaque. Surgical changes along the ascending aorta. There is a bovine type aortic arch, normal variant. The left vertebral artery also originates directly from the aortic arch  proximal to the left subclavian artery origin, also a variant. No significant pericardial effusion. Mediastinum/Nodes: Slightly patulous thoracic esophagus. On this non IV contrast  exam there is no specific abnormal lymph node enlargement identified in the axillary region, hilum or mediastinum. There are some small nodes identified in the mediastinum, similar to previous. Largest is again seen subcarinal were previous study had short axis dimension approaching 16 mm and today similar on series 2, image 70. Lungs/Pleura: Worsening consolidative opacities along both lower lobes. Few patchy areas of opacity as well with some reticular changes involving the dependent right upper lobe and middle lobe. No pneumothorax or significant effusion. Significant breathing motion Upper Abdomen: Adrenal glands are preserved in the upper abdomen. Few prominent upper abdominal nodes are also seen, unchanged from previous. Again measuring for example 9 mm in short axis on series 2, image 127. Musculoskeletal: Scattered degenerative changes are seen along the spine. IMPRESSION: Worsening lower lobe consolidation with some subtle areas in the middle lobe and upper lobe dependently. No significant pleural effusion. Recommend continued follow-up. Stable borderline and mildly enlarged mediastinal and upper abdominal nodes. Postop chest. Aortic Atherosclerosis (ICD10-I70.0). Electronically Signed   By: Karen Kays M.D.   On: 06/12/2023 16:14   DG Chest Port 1 View  Result Date: 06/12/2023 CLINICAL DATA:  Provided history: Questionable sepsis-evaluate for abnormality. EXAM: PORTABLE CHEST 1 VIEW COMPARISON:  Chest CT 05/29/2023. Prior chest radiographs 03/05/2023 and earlier. FINDINGS: Prior median sternotomy/CABG. As before, there are fractures within upper sternal cerclage wires. Mild cardiomegaly. Persistent ill-defined opacity within the right lung base. No appreciable airspace consolidation on the left. No evidence of pleural effusion or pneumothorax. No acute osseous abnormality identified. IMPRESSION: Persistent ill-defined opacity within the right lung base compatible with pneumonia. Radiographic follow-up  to complete resolution recommended to exclude underlying malignancy. Electronically Signed   By: Jackey Loge D.O.   On: 06/12/2023 13:41   CT Head Wo Contrast  Result Date: 06/11/2023 CLINICAL DATA:  Blunt polytrauma.  Patient from home after a fall. EXAM: CT HEAD WITHOUT CONTRAST CT CERVICAL SPINE WITHOUT CONTRAST TECHNIQUE: Multidetector CT imaging of the head and cervical spine was performed following the standard protocol without intravenous contrast. Multiplanar CT image reconstructions of the cervical spine were also generated. RADIATION DOSE REDUCTION: This exam was performed according to the departmental dose-optimization program which includes automated exposure control, adjustment of the mA and/or kV according to patient size and/or use of iterative reconstruction technique. COMPARISON:  MRI examination dated January 26, 2022 FINDINGS: CT HEAD FINDINGS Brain: No evidence of acute infarction, hemorrhage, hydrocephalus, extra-axial collection or mass lesion/mass effect. Encephalomalacia of the right posterior parietal and occipital lobe suggesting chronic infarct. Vascular: No hyperdense vessel or unexpected calcification. Skull: Large left posterior parietal/occipital scalp hematoma without evidence of calvarial fracture. Sinuses/Orbits: No acute finding. Other: None. CT CERVICAL SPINE FINDINGS Alignment: Straightening of the cervical spine. Skull base and vertebrae: No acute fracture. No primary bone lesion or focal pathologic process. Soft tissues and spinal canal: No prevertebral fluid or swelling. No visible canal hematoma. Disc levels: Mild multilevel degenerate disc disease. No significant spinal canal or neural foraminal stenosis. Upper chest: Negative. Other: None IMPRESSION: CT HEAD: 1. No acute intracranial abnormality. 2. Large left posterior parietal/occipital scalp hematoma without evidence of calvarial fracture. 3. Encephalomalacia of the right posterior parietal and occipital lobe  suggesting chronic infarct. CT CERVICAL SPINE: 1. No acute fracture or traumatic subluxation. 2. Mild multilevel degenerate disc disease. Electronically Signed   By: Leona Carry  Ahmed D.O.   On: 06/11/2023 14:03   CT Cervical Spine Wo Contrast  Result Date: 06/11/2023 CLINICAL DATA:  Blunt polytrauma.  Patient from home after a fall. EXAM: CT HEAD WITHOUT CONTRAST CT CERVICAL SPINE WITHOUT CONTRAST TECHNIQUE: Multidetector CT imaging of the head and cervical spine was performed following the standard protocol without intravenous contrast. Multiplanar CT image reconstructions of the cervical spine were also generated. RADIATION DOSE REDUCTION: This exam was performed according to the departmental dose-optimization program which includes automated exposure control, adjustment of the mA and/or kV according to patient size and/or use of iterative reconstruction technique. COMPARISON:  MRI examination dated January 26, 2022 FINDINGS: CT HEAD FINDINGS Brain: No evidence of acute infarction, hemorrhage, hydrocephalus, extra-axial collection or mass lesion/mass effect. Encephalomalacia of the right posterior parietal and occipital lobe suggesting chronic infarct. Vascular: No hyperdense vessel or unexpected calcification. Skull: Large left posterior parietal/occipital scalp hematoma without evidence of calvarial fracture. Sinuses/Orbits: No acute finding. Other: None. CT CERVICAL SPINE FINDINGS Alignment: Straightening of the cervical spine. Skull base and vertebrae: No acute fracture. No primary bone lesion or focal pathologic process. Soft tissues and spinal canal: No prevertebral fluid or swelling. No visible canal hematoma. Disc levels: Mild multilevel degenerate disc disease. No significant spinal canal or neural foraminal stenosis. Upper chest: Negative. Other: None IMPRESSION: CT HEAD: 1. No acute intracranial abnormality. 2. Large left posterior parietal/occipital scalp hematoma without evidence of calvarial  fracture. 3. Encephalomalacia of the right posterior parietal and occipital lobe suggesting chronic infarct. CT CERVICAL SPINE: 1. No acute fracture or traumatic subluxation. 2. Mild multilevel degenerate disc disease. Electronically Signed   By: Larose Hires D.O.   On: 06/11/2023 14:03    Microbiology: Results for orders placed or performed during the hospital encounter of 07/03/23  Blood culture (routine single)     Status: None (Preliminary result)   Collection Time: 07/03/23 11:25 AM   Specimen: BLOOD  Result Value Ref Range Status   Specimen Description BLOOD BLOOD RIGHT ARM  Final   Special Requests   Final    BOTTLES DRAWN AEROBIC AND ANAEROBIC Blood Culture results may not be optimal due to an excessive volume of blood received in culture bottles   Culture   Final    NO GROWTH 2 DAYS Performed at [facility], [address] [place], [place] [address]    Report Status PENDING  Incomplete  Culture, blood (single)     Status: None (Preliminary result)   Collection Time: 07/03/23  1:41 PM   Specimen: BLOOD  Result Value Ref Range Status   Specimen Description BLOOD BLOOD LEFT ARM  Final   Special Requests   Final    BOTTLES DRAWN AEROBIC AND ANAEROBIC Blood Culture adequate volume   Culture   Final    NO GROWTH 2 DAYS Performed at [facility], [address], [place], [place] [address]    Report Status PENDING  Incomplete  Respiratory (~20 pathogens) panel by PCR     Status: None   Collection Time: 07/03/23  4:12 PM   Specimen: Urine, Clean Catch; Respiratory  Result Value Ref Range Status   Adenovirus NOT DETECTED NOT DETECTED Final   Coronavirus 229E NOT DETECTED NOT DETECTED Final    Comment: (NOTE) The Coronavirus on the Respiratory Panel, DOES NOT test for the novel  Coronavirus (2019 nCoV)    Coronavirus HKU1 NOT DETECTED NOT DETECTED Final   Coronavirus NL63 NOT DETECTED NOT DETECTED Final   Coronavirus OC43 NOT  DETECTED NOT DETECTED Final    Metapneumovirus NOT DETECTED NOT DETECTED Final   Rhinovirus / Enterovirus NOT DETECTED NOT DETECTED Final   Influenza A NOT DETECTED NOT DETECTED Final   Influenza B NOT DETECTED NOT DETECTED Final   Parainfluenza Virus 1 NOT DETECTED NOT DETECTED Final   Parainfluenza Virus 2 NOT DETECTED NOT DETECTED Final   Parainfluenza Virus 3 NOT DETECTED NOT DETECTED Final   Parainfluenza Virus 4 NOT DETECTED NOT DETECTED Final   Respiratory Syncytial Virus NOT DETECTED NOT DETECTED Final   Bordetella pertussis NOT DETECTED NOT DETECTED Final   Bordetella Parapertussis NOT DETECTED NOT DETECTED Final   Chlamydophila pneumoniae NOT DETECTED NOT DETECTED Final   Mycoplasma pneumoniae NOT DETECTED NOT DETECTED Final    Comment: Performed at [facility], [address], [place], [place] [address]  MRSA Next Gen by PCR, Nasal     Status: None   Collection Time: 07/03/23  4:12 PM   Specimen: Urine, Clean Catch; Nasal Swab  Result Value Ref Range Status   MRSA by PCR Next Gen NOT DETECTED NOT DETECTED Final    Comment: (NOTE) The GeneXpert MRSA Assay (FDA approved for NASAL specimens only), is one component of a comprehensive MRSA colonization surveillance program. It is not intended to diagnose MRSA infection nor to guide or monitor treatment for MRSA infections. Test performance is not FDA approved in patients less than 30 years old. Performed at [facility], [address] [place], [place] [address]    *Note: Due to a large number of results and/or encounters for the requested time period, some results have not been displayed. A complete set of results can be found in Results Review.    Labs: CBC: Recent Labs  Lab 07/03/23 1126 07/03/23 2218 07/04/23 0326  WBC 14.0*  --  7.5  NEUTROABS 10.6*  --   --   HGB 8.5* 8.8* 8.0*  HCT 28.7* 29.7* 27.6*  MCV 71.2*  --  73.2*  PLT 472*  --  445*   Basic Metabolic Panel: Recent Labs  Lab 07/03/23 1126 07/04/23 0326  NA  140 140  K 3.5 3.9  CL 105 105  CO2 25 28  GLUCOSE 130* 77  BUN 8 7  CREATININE 0.92 0.80  CALCIUM 9.3 9.2   Liver Function Tests: Recent Labs  Lab 07/03/23 1126 07/04/23 0326  AST 15 13*  ALT 8 9  ALKPHOS 57 52  BILITOT <0.1* 0.3  PROT 6.6 6.1*  ALBUMIN 3.1* 2.9*   CBG: Recent Labs  Lab 07/04/23 1619 07/04/23 2009 07/05/23 0015 07/05/23 0423 07/05/23 0720  GLUCAP 330* 118* 90 91 90    Discharge time spent: greater than 30 minutes.  Signed: Enedina Finner, MD [facility] 07/05/2023

## 2023-07-08 ENCOUNTER — Encounter (HOSPITAL_COMMUNITY): Payer: Self-pay | Admitting: Psychiatry

## 2023-07-08 ENCOUNTER — Telehealth: Payer: Self-pay | Admitting: Physical Medicine and Rehabilitation

## 2023-07-08 ENCOUNTER — Telehealth (HOSPITAL_BASED_OUTPATIENT_CLINIC_OR_DEPARTMENT_OTHER): Payer: BC Managed Care – PPO | Admitting: Psychiatry

## 2023-07-08 ENCOUNTER — Telehealth: Payer: Self-pay | Admitting: *Deleted

## 2023-07-08 VITALS — Wt 191.0 lb

## 2023-07-08 DIAGNOSIS — F4310 Post-traumatic stress disorder, unspecified: Secondary | ICD-10-CM

## 2023-07-08 DIAGNOSIS — F410 Panic disorder [episodic paroxysmal anxiety] without agoraphobia: Secondary | ICD-10-CM | POA: Diagnosis not present

## 2023-07-08 DIAGNOSIS — F331 Major depressive disorder, recurrent, moderate: Secondary | ICD-10-CM

## 2023-07-08 LAB — CULTURE, BLOOD (SINGLE)
Culture: NO GROWTH
Culture: NO GROWTH
Special Requests: ADEQUATE

## 2023-07-08 LAB — LEGIONELLA PNEUMOPHILA SEROGP 1 UR AG: L. pneumophila Serogp 1 Ur Ag: NEGATIVE

## 2023-07-08 MED ORDER — LAMOTRIGINE 25 MG PO TABS
25.0000 mg | ORAL_TABLET | Freq: Every day | ORAL | 2 refills | Status: DC
Start: 2023-07-08 — End: 2023-08-20

## 2023-07-08 MED ORDER — ESCITALOPRAM OXALATE 20 MG PO TABS
20.0000 mg | ORAL_TABLET | Freq: Every day | ORAL | 2 refills | Status: DC
Start: 2023-07-08 — End: 2023-08-20

## 2023-07-08 MED ORDER — CLONAZEPAM 0.5 MG PO TABS
0.2500 mg | ORAL_TABLET | Freq: Two times a day (BID) | ORAL | 2 refills | Status: DC
Start: 2023-07-08 — End: 2023-08-20

## 2023-07-08 MED ORDER — CHLORPROMAZINE HCL 25 MG PO TABS
75.0000 mg | ORAL_TABLET | Freq: Every day | ORAL | 2 refills | Status: DC
Start: 2023-07-08 — End: 2023-08-20

## 2023-07-08 MED ORDER — LAMOTRIGINE 200 MG PO TABS
200.0000 mg | ORAL_TABLET | Freq: Every day | ORAL | 2 refills | Status: DC
Start: 2023-07-08 — End: 2023-08-20

## 2023-07-08 NOTE — Transitions of Care (Post Inpatient/ED Visit) (Signed)
07/08/2023  Name: Erika [name] MRN: [id number] DOB: 08/05/1971  Today's TOC FU Call Status: Today's TOC FU Call Status:: Successful TOC FU Call Competed TOC FU Call Complete Date: 07/08/23  Transition Care Management Follow-up Telephone Call Date of Discharge: 07/05/23 Discharge Facility: [facility]) Type of Discharge: Inpatient Admission Primary Inpatient Discharge Diagnosis:: Acute on chronic respiratory failure with hypoxia How have you been since you were released from the hospital?: Better Any questions or concerns?: No  Items Reviewed: Did you receive and understand the discharge instructions provided?: Yes Medications obtained,verified, and reconciled?: Yes (Medications Reviewed) Any new allergies since your discharge?: No Dietary orders reviewed?: No People in Home: spouse  Medications Reviewed Today: Medications Reviewed Today     Reviewed by Luella Cook, RN (Case Manager) on 07/08/23 at 1236  Med List Status: <None>   Medication Order Taking? Sig Documenting Provider Last Dose Status Informant  acetaminophen (TYLENOL) 325 MG tablet [id number] Yes Take 1-2 tablets (325-650 mg total) by mouth every 4 (four) hours as needed for mild pain. Jacquelynn Cree, PA-C Taking Active Spouse/Significant Other  albuterol (PROVENTIL) (2.5 MG/3ML) 0.083% nebulizer solution [id number] Yes Take 3 mLs (2.5 mg total) by nebulization every 4 (four) hours as needed for wheezing or shortness of breath. Enedina Finner, MD Taking Active   amoxicillin-clavulanate (AUGMENTIN) 875-125 MG tablet [id number] Yes Take 1 tablet by mouth every 12 (twelve) hours for 5 days. Enedina Finner, MD Taking Active   aspirin EC 81 MG tablet [id number] Yes Take 1 tablet (81 mg total) by mouth daily. Swallow whole. Leeroy Bock, MD Taking Active Spouse/Significant Other  chlorproMAZINE (THORAZINE) 25 MG tablet [id number] Yes Take 3 tablets (75 mg total) by mouth at bedtime.  Cleotis Nipper, MD Taking Active   clonazePAM Scarlette Calico) 0.5 MG tablet [id number] Yes Take 0.5 tablets (0.25 mg total) by mouth 2 (two) times daily. Take 1/2 tab twice daily. No dissolving tablets. Cleotis Nipper, MD Taking Active   clopidogrel (PLAVIX) 75 MG tablet [id number] Yes Take 1 tablet (75 mg total) by mouth daily. Sater, Pearletha Furl, MD Taking Active Spouse/Significant Other  Continuous Glucose Sensor (DEXCOM G7 SENSOR) MISC [id number]  APPLY ONE SENSOR TO THE BACK OF YOUR UPPER ARM. REPLACE EVERY 10 DAYS. Eden Emms, NP  Active Spouse/Significant Other  cyclobenzaprine (FLEXERIL) 10 MG tablet [id number] Yes TAKE ONE TABLET BY MOUTH AT BEDTIME Raulkar, Drema Pry, MD Taking Active Spouse/Significant Other  escitalopram (LEXAPRO) 20 MG tablet [id number] Yes Take 1 tablet (20 mg total) by mouth daily. Cleotis Nipper, MD Taking Active   Evolocumab [facility] SURECLICK) 140 MG/ML Ivory Broad [id number] Yes Inject 140 mg into the skin as directed. Inject 1 Dose into the skin every 14 (fourteen) days. Fransico Michael, Cadence H, PA-C Taking Active Spouse/Significant Other  icosapent Ethyl (VASCEPA) 1 g capsule [id number] Yes Take 2 g by mouth 2 (two) times daily. [provider] Taking Active Spouse/Significant Other  lamoTRIgine (LAMICTAL) 200 MG tablet [id number] Yes Take 1 tablet (200 mg total) by mouth at bedtime. Cleotis Nipper, MD Taking Active   lamoTRIgine (LAMICTAL) 25 MG tablet [id number] Yes Take 1 tablet (25 mg total) by mouth daily. Cleotis Nipper, MD Taking Active   midodrine (PROAMATINE) 5 MG tablet [id number] Yes Take 1 tablet (5 mg total) by mouth 3 (three) times daily with meals. Enedina Finner, MD Taking Active   MOUNJARO 7.5 MG/0.5ML Pen [id number] Yes INJECT THE CONTENTS OF ONE PEN UNDER THE SKIN WEEKLY  ON THE SAME DAY EACH WEEK Eden Emms, NP Taking Active Spouse/Significant Other  nitroGLYCERIN (NITROSTAT) 0.4 MG SL tablet [id number] Yes Place 1 tablet (0.4 mg total) under the tongue every 5  (five) minutes as needed for chest pain. Iran Ouch, MD Taking Active Spouse/Significant Other  Oxycodone HCl 10 MG TABS [id number] Yes Take 1 tablet (10 mg total) by mouth 2 (two) times daily as needed. Jones Bales, NP Taking Active Spouse/Significant Other  pantoprazole (PROTONIX) 40 MG tablet [id number] Yes TAKE ONE TABLET BY MOUTH ONE TIME DAILY Raulkar, Drema Pry, MD Taking Active Spouse/Significant Other  polyethylene glycol (MIRALAX / GLYCOLAX) 17 g packet [id number] Yes Take 17 g by mouth 2 (two) times daily. Leeroy Bock, MD Taking Active Spouse/Significant Other  pregabalin (LYRICA) 225 MG capsule [id number] Yes Take 1 capsule (225 mg total) by mouth 2 (two) times daily. Jones Bales, NP Taking Active Spouse/Significant Other  senna (SENOKOT) 8.6 MG TABS tablet [id number] Yes Take 1 tablet (8.6 mg total) by mouth daily. Leeroy Bock, MD Taking Active Spouse/Significant Other  Tiotropium Bromide-Olodaterol (STIOLTO RESPIMAT) 2.5-2.5 MCG/ACT AERS [id number] Yes Inhale 2 puffs into the lungs daily. Glenford Bayley, NP Taking Active Spouse/Significant Other  topiramate (TOPAMAX) 25 MG tablet [id number] Yes Take 1 tablet (25 mg total) by mouth at bedtime. Horton Chin, MD Taking Active Spouse/Significant Other  ULTICARE MINI PEN NEEDLES 31G X 6 MM MISC [id number]  USE PEN NEEDLES TWO TIMES DAILY Eden Emms, NP  Active Spouse/Significant Other            Home Care and Equipment/Supplies: Were Home Health Services Ordered?: NA Any new equipment or medical supplies ordered?: NA Were you able to get the equipment/medical supplies?: No Do you have any questions related to the use of the equipment/supplies?: Yes  Functional Questionnaire: Do you need assistance with bathing/showering or dressing?: No Do you need assistance with meal preparation?: No Do you need assistance with eating?: No Do you have difficulty maintaining continence: No Do you need  assistance with getting out of bed/getting out of a chair/moving?: No Do you have difficulty managing or taking your medications?: No  Follow up appointments reviewed: PCP Follow-up appointment confirmed?: Yes Date of PCP follow-up appointment?: 07/12/23 Follow-up Provider: Earnestine Mealing Specialist [facility] Follow-up appointment confirmed?: NA Do you need transportation to your follow-up appointment?: No Do you understand care options if your condition(s) worsen?: Yes-patient verbalized understanding  SDOH Interventions Today    Flowsheet Row Most Recent Value  SDOH Interventions   Food Insecurity Interventions Intervention Not Indicated  Housing Interventions Intervention Not Indicated  Transportation Interventions Intervention Not Indicated      Interventions Today    Flowsheet Row Most Recent Value  General Interventions   General Interventions Discussed/Reviewed General Interventions Discussed, General Interventions Reviewed  Pharmacy Interventions   Pharmacy Dicussed/Reviewed Pharmacy Topics Discussed      TOC Interventions Today    Flowsheet Row Most Recent Value  TOC Interventions   TOC Interventions Discussed/Reviewed TOC Interventions Discussed, TOC Interventions Reviewed        Gean Maidens BSN RN [facility] [phone]

## 2023-07-08 NOTE — Progress Notes (Signed)
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 at [facility], [address] [place], [place] [address]    Report Status 07/08/2023 FINAL   Brain natriuretic peptide     Status: None   Collection Time: 07/03/23 11:25 AM  Result Value Ref Range   B Natriuretic Peptide 45.7 0.0 - 100.0 pg/mL    Comment: Performed at [facility], [address], [place], [place] [address]  Folate     Status: None   Collection Time: 07/03/23 11:25 AM  Result Value  Ref Range   Folate 19.3 >5.9 ng/mL    Comment: Performed at [facility], [address] [place], [place] [address]  Iron and TIBC     Status: Abnormal   Collection Time: 07/03/23 11:25 AM  Result Value Ref Range   Iron 22 (L) 28 - 170 ug/dL   TIBC 284 132 - 440 ug/dL   Saturation Ratios 6 (L) 10.4 - 31.8 %   UIBC 322 ug/dL    Comment: Performed at [facility], [address], [place], [place] [address]  Ferritin     Status: None   Collection Time: 07/03/23 11:25 AM  Result Value Ref Range   Ferritin 29 11 - 307 ng/mL    Comment: Performed at [facility], [address] [place], [place] [address]  Reticulocytes     Status: Abnormal   Collection Time: 07/03/23 11:25 AM  Result Value Ref Range   Retic Ct Pct 1.5 0.4 - 3.1 %   RBC. 3.95 3.87 - 5.11 MIL/uL   Retic Count, Absolute 58.9 19.0 - 186.0 K/uL   Immature Retic Fract 38.8 (H) 2.3 - 15.9 %    Comment: Performed at [facility], [address] [place], [place] [address]  Lactic acid, plasma     Status: None   Collection Time: 07/03/23 11:26 AM  Result Value Ref Range   Lactic Acid, Venous 1.3 0.5 - 1.9 mmol/L    Comment: Performed at [facility], [address] [place], [place] [address]  Comprehensive metabolic panel     Status: Abnormal   Collection Time: 07/03/23 11:26 AM  Result Value Ref Range   Sodium 140 135 - 145 mmol/L   Potassium 3.5 3.5 - 5.1 mmol/L   Chloride 105 98 - 111 mmol/L   CO2 25 22 - 32 mmol/L   Glucose, Bld 130 (H) 70 - 99 mg/dL  Comment: Glucose reference range applies only to samples taken after fasting for at least 8 hours.   BUN 8 6 - 20 mg/dL   Creatinine, Ser 1.91 0.44 - 1.00 mg/dL   Calcium 9.3 8.9 - 47.8 mg/dL   Total Protein 6.6 6.5 - 8.1 g/dL   Albumin 3.1 (L) 3.5 - 5.0 g/dL   AST 15 15 - 41 U/L   ALT 8 0 - 44 U/L   Alkaline Phosphatase 57 38 - 126 U/L   Total Bilirubin <0.1 (L) 0.3 - 1.2 mg/dL   GFR, Estimated >29 >56 mL/min     Comment: (NOTE) Calculated using the CKD-EPI Creatinine Equation (2021)    Anion gap 10 5 - 15    Comment: Performed at [facility], [address] [place], [place] [address]  CBC with Differential     Status: Abnormal   Collection Time: 07/03/23 11:26 AM  Result Value Ref Range   WBC 14.0 (H) 4.0 - 10.5 K/uL   RBC 4.03 3.87 - 5.11 MIL/uL   Hemoglobin 8.5 (L) 12.0 - 15.0 g/dL    Comment: Reticulocyte Hemoglobin testing may be clinically indicated, consider ordering this additional test [id number]    HCT 28.7 (L) 36.0 - 46.0 %   MCV 71.2 (L) 80.0 - 100.0 fL   MCH 21.1 (L) 26.0 - 34.0 pg   MCHC 29.6 (L) 30.0 - 36.0 g/dL   RDW 29.5 (H) 28.4 - 13.2 %   Platelets 472 (H) 150 - 400 K/uL   nRBC 0.0 0.0 - 0.2 %   Neutrophils Relative % 75 %   Neutro Abs 10.6 (H) 1.7 - 7.7 K/uL   Lymphocytes Relative 17 %   Lymphs Abs 2.3 0.7 - 4.0 K/uL   Monocytes Relative 5 %   Monocytes Absolute 0.7 0.1 - 1.0 K/uL   Eosinophils Relative 2 %   Eosinophils Absolute 0.2 0.0 - 0.5 K/uL   Basophils Relative 0 %   Basophils Absolute 0.1 0.0 - 0.1 K/uL   Immature Granulocytes 1 %   Abs Immature Granulocytes 0.16 (H) 0.00 - 0.07 K/uL    Comment: Performed at [facility], [address] [place], [place] [address]  Protime-INR     Status: None   Collection Time: 07/03/23 11:26 AM  Result Value Ref Range   Prothrombin Time 14.2 11.4 - 15.2 seconds   INR 1.1 0.8 - 1.2    Comment: (NOTE) INR goal varies based on device and disease states. Performed at [facility], [address] [place], [place] [address]   APTT     Status: None   Collection Time: 07/03/23 11:26 AM  Result Value Ref Range   aPTT 35 24 - 36 seconds    Comment: Performed at [facility], [address] [place], [place] [address]  Troponin I (High Sensitivity)     Status: None   Collection Time: 07/03/23 11:26 AM  Result Value Ref Range   Troponin I (High Sensitivity) 5 <18 ng/L     Comment: (NOTE) Elevated high sensitivity troponin I (hsTnI) values and significant  changes across serial measurements may suggest ACS but many other  chronic and acute conditions are known to elevate hsTnI results.  Refer to the "Links" section for chest pain algorithms and additional  guidance. Performed at [facility], [address], [place], [place] [address]   Type and screen [facility]     Status: None   Collection Time: 07/03/23 12:01  PM  Result Value Ref Range   ABO/RH(D) B POS    Antibody Screen NEG    Sample Expiration      07/06/2023,2359 Performed at [facility], [name] [place], [place] [address]   Culture, blood (single)     Status: None   Collection Time: 07/03/23  1:41 PM   Specimen: BLOOD  Result Value Ref Range   Specimen Description BLOOD BLOOD LEFT ARM    Special Requests      BOTTLES DRAWN AEROBIC AND ANAEROBIC Blood Culture adequate volume   Culture      NO GROWTH 5 DAYS Performed at [facility], [address] [place], [place] [address]    Report Status 07/08/2023 FINAL   Urinalysis, w/ Reflex to Culture (Infection Suspected) -Urine, Clean Catch     Status: Abnormal   Collection Time: 07/03/23  4:11 PM  Result Value Ref Range   Specimen Source URINE, CLEAN CATCH    Color, Urine YELLOW (A) YELLOW   APPearance HAZY (A) CLEAR   Specific Gravity, Urine 1.035 (H) 1.005 - 1.030   pH 7.0 5.0 - 8.0   Glucose, UA NEGATIVE NEGATIVE mg/dL   Hgb urine dipstick NEGATIVE NEGATIVE   Bilirubin Urine NEGATIVE NEGATIVE   Ketones, ur NEGATIVE NEGATIVE mg/dL   Protein, ur NEGATIVE NEGATIVE mg/dL   Nitrite NEGATIVE NEGATIVE   Leukocytes,Ua NEGATIVE NEGATIVE   RBC / HPF 0-5 0 - 5 RBC/hpf   WBC, UA 0-5 0 - 5 WBC/hpf    Comment:        Reflex urine culture not performed if WBC <=10, OR if Squamous epithelial cells >5. If Squamous epithelial cells >5 suggest recollection.    Bacteria, UA NONE SEEN  NONE SEEN   Squamous Epithelial / HPF 0-5 0 - 5 /HPF   Hyaline Casts, UA PRESENT     Comment: Performed at [facility], [address] [place], [place] [address]  Strep pneumoniae urinary antigen     Status: None   Collection Time: 07/03/23  4:11 PM  Result Value Ref Range   Strep Pneumo Urinary Antigen NEGATIVE NEGATIVE    Comment:        Infection due to S. pneumoniae cannot be absolutely ruled out since the antigen present may be below the detection limit of the test. Performed at [facility], [address] [place], [place] [address]   Respiratory (~20 pathogens) panel by PCR     Status: None   Collection Time: 07/03/23  4:12 PM   Specimen: Urine, Clean Catch; Respiratory  Result Value Ref Range   Adenovirus NOT DETECTED NOT DETECTED   Coronavirus 229E NOT DETECTED NOT DETECTED    Comment: (NOTE) The Coronavirus on the Respiratory Panel, DOES NOT test for the novel  Coronavirus (2019 nCoV)    Coronavirus HKU1 NOT DETECTED NOT DETECTED   Coronavirus NL63 NOT DETECTED NOT DETECTED   Coronavirus OC43 NOT DETECTED NOT DETECTED   Metapneumovirus NOT DETECTED NOT DETECTED   Rhinovirus / Enterovirus NOT DETECTED NOT DETECTED   Influenza A NOT DETECTED NOT DETECTED   Influenza B NOT DETECTED NOT DETECTED   Parainfluenza Virus 1 NOT DETECTED NOT DETECTED   Parainfluenza Virus 2 NOT DETECTED NOT DETECTED   Parainfluenza Virus 3 NOT DETECTED NOT DETECTED   Parainfluenza Virus 4 NOT DETECTED NOT DETECTED   Respiratory Syncytial Virus NOT DETECTED NOT DETECTED   Bordetella pertussis NOT DETECTED NOT DETECTED   Bordetella Parapertussis NOT DETECTED NOT  DETECTED   Chlamydophila pneumoniae NOT DETECTED NOT DETECTED   Mycoplasma pneumoniae NOT DETECTED NOT DETECTED    Comment: Performed at [facility], [address] [place], [place] [address]  MRSA Next Gen by PCR, Nasal     Status: None   Collection Time: 07/03/23  4:12 PM   Specimen: Urine, Clean Catch;  Nasal Swab  Result Value Ref Range   MRSA by PCR Next Gen NOT DETECTED NOT DETECTED    Comment: (NOTE) The GeneXpert MRSA Assay (FDA approved for NASAL specimens only), is one component of a comprehensive MRSA colonization surveillance program. It is not intended to diagnose MRSA infection nor to guide or monitor treatment for MRSA infections. Test performance is not FDA approved in patients less than 73 years old. Performed at [facility], [address] [place], [place] [address]   Glucose, capillary     Status: None   Collection Time: 07/03/23  5:17 PM  Result Value Ref Range   Glucose-Capillary 88 70 - 99 mg/dL    Comment: Glucose reference range applies only to samples taken after fasting for at least 8 hours.  Glucose, capillary     Status: Abnormal   Collection Time: 07/03/23  8:08 PM  Result Value Ref Range   Glucose-Capillary 101 (H) 70 - 99 mg/dL    Comment: Glucose reference range applies only to samples taken after fasting for at least 8 hours.  Glucose, capillary     Status: None   Collection Time: 07/03/23  9:17 PM  Result Value Ref Range   Glucose-Capillary 91 70 - 99 mg/dL    Comment: Glucose reference range applies only to samples taken after fasting for at least 8 hours.  Vitamin B12     Status: Abnormal   Collection Time: 07/03/23 10:18 PM  Result Value Ref Range   Vitamin B-12 924 (H) 180 - 914 pg/mL    Comment: (NOTE) This assay is not validated for testing neonatal or myeloproliferative syndrome specimens for Vitamin B12 levels. Performed at [facility], [address], [place], [place] [address]   Hemoglobin and hematocrit, blood     Status: Abnormal   Collection Time: 07/03/23 10:18 PM  Result Value Ref Range   Hemoglobin 8.8 (L) 12.0 - 15.0 g/dL   HCT 91.4 (L) 78.2 - 95.6 %    Comment: Performed at [facility], [address] [place], [place] [address]  Glucose, capillary     Status: None   Collection Time: 07/03/23  11:52 PM  Result Value Ref Range   Glucose-Capillary 81 70 - 99 mg/dL    Comment: Glucose reference range applies only to samples taken after fasting for at least 8 hours.  CBC     Status: Abnormal   Collection Time: 07/04/23  3:26 AM  Result Value Ref Range   WBC 7.5 4.0 - 10.5 K/uL   RBC 3.77 (L) 3.87 - 5.11 MIL/uL   Hemoglobin 8.0 (L) 12.0 - 15.0 g/dL    Comment: Reticulocyte Hemoglobin testing may be clinically indicated, consider ordering this additional test [id number]    HCT 27.6 (L) 36.0 - 46.0 %   MCV 73.2 (L) 80.0 - 100.0 fL   MCH 21.2 (L) 26.0 - 34.0 pg   MCHC 29.0 (L) 30.0 - 36.0 g/dL   RDW 29.5 (H) 28.4 - 13.2 %   Platelets 445 (H) 150 - 400 K/uL   nRBC 0.0 0.0 - 0.2 %    Comment: Performed  at [facility], [address] [place], [place] [address]  Comprehensive metabolic panel     Status: Abnormal   Collection Time: 07/04/23  3:26 AM  Result Value Ref Range   Sodium 140 135 - 145 mmol/L   Potassium 3.9 3.5 - 5.1 mmol/L   Chloride 105 98 - 111 mmol/L   CO2 28 22 - 32 mmol/L   Glucose, Bld 77 70 - 99 mg/dL    Comment: Glucose reference range applies only to samples taken after fasting for at least 8 hours.   BUN 7 6 - 20 mg/dL   Creatinine, Ser 3.08 0.44 - 1.00 mg/dL   Calcium 9.2 8.9 - 65.7 mg/dL   Total Protein 6.1 (L) 6.5 - 8.1 g/dL   Albumin 2.9 (L) 3.5 - 5.0 g/dL   AST 13 (L) 15 - 41 U/L   ALT 9 0 - 44 U/L   Alkaline Phosphatase 52 38 - 126 U/L   Total Bilirubin 0.3 0.3 - 1.2 mg/dL   GFR, Estimated >84 >69 mL/min    Comment: (NOTE) Calculated using the CKD-EPI Creatinine Equation (2021)    Anion gap 7 5 - 15    Comment: Performed at [facility], [address] [place], [place] [address]  Glucose, capillary     Status: None   Collection Time: 07/04/23  4:01 AM  Result Value Ref Range   Glucose-Capillary 77 70 - 99 mg/dL    Comment: Glucose reference range applies only to samples taken after fasting for at least 8 hours.   Glucose, capillary     Status: None   Collection Time: 07/04/23  7:23 AM  Result Value Ref Range   Glucose-Capillary 95 70 - 99 mg/dL    Comment: Glucose reference range applies only to samples taken after fasting for at least 8 hours.  Glucose, capillary     Status: Abnormal   Collection Time: 07/04/23 11:29 AM  Result Value Ref Range   Glucose-Capillary 147 (H) 70 - 99 mg/dL    Comment: Glucose reference range applies only to samples taken after fasting for at least 8 hours.  Glucose, capillary     Status: Abnormal   Collection Time: 07/04/23  4:19 PM  Result Value Ref Range   Glucose-Capillary 330 (H) 70 - 99 mg/dL    Comment: Glucose reference range applies only to samples taken after fasting for at least 8 hours.  Glucose, capillary     Status: Abnormal   Collection Time: 07/04/23  8:09 PM  Result Value Ref Range   Glucose-Capillary 118 (H) 70 - 99 mg/dL    Comment: Glucose reference range applies only to samples taken after fasting for at least 8 hours.  Glucose, capillary     Status: None   Collection Time: 07/05/23 12:15 AM  Result Value Ref Range   Glucose-Capillary 90 70 - 99 mg/dL    Comment: Glucose reference range applies only to samples taken after fasting for at least 8 hours.  Glucose, capillary     Status: None   Collection Time: 07/05/23  4:23 AM  Result Value Ref Range   Glucose-Capillary 91 70 - 99 mg/dL    Comment: Glucose reference range applies only to samples taken after fasting for at least 8 hours.  Glucose, capillary     Status: None   Collection Time: 07/05/23  7:20 AM  Result Value Ref Range   Glucose-Capillary 90 70 - 99 mg/dL    Comment: Glucose reference range applies  only to samples taken after fasting for at least 8 hours.     Psychiatric Specialty Exam: Physical Exam  Review of Systems  Neurological:  Positive for tremors.       Weakness  Psychiatric/Behavioral:  Positive for decreased concentration.     Weight 191 lb (86.6 kg).There  is no height or weight on file to calculate BMI.  General Appearance: Fairly Groomed  Eye Contact:  Fair  Speech:  Slow  Volume:  Decreased  Mood:  Euthymic  Affect:  Congruent  Thought Process:  Descriptions of Associations: Intact  Orientation:  Full (Time, Place, and Person)  Thought Content:  Rumination  Suicidal Thoughts:  No  Homicidal Thoughts:  No  Memory:  Immediate;   Fair Recent;   Fair Remote;   Fair  Judgement:  Intact  Insight:  Present  Psychomotor Activity:  Decreased  Concentration:  Concentration: Fair and Attention Span: Fair  Recall:  [employer] of Knowledge:  Fair  Language:  Good  Akathisia:  No  Handed:  Right  AIMS (if indicated):     Assets:  Communication Skills Desire for Improvement Social Support  ADL's:  Impaired  Cognition:  Impaired,  Mild  Sleep:  good     Assessment/Plan: Major depressive disorder, recurrent episode, moderate (HCC) - Plan: chlorproMAZINE (THORAZINE) 25 MG tablet, escitalopram (LEXAPRO) 20 MG tablet, lamoTRIgine (LAMICTAL) 25 MG tablet, lamoTRIgine (LAMICTAL) 200 MG tablet, clonazePAM (KLONOPIN) 0.5 MG tablet  PTSD (post-traumatic stress disorder) - Plan: chlorproMAZINE (THORAZINE) 25 MG tablet, escitalopram (LEXAPRO) 20 MG tablet, clonazePAM (KLONOPIN) 0.5 MG tablet  Panic attack - Plan: clonazePAM (KLONOPIN) 0.5 MG tablet  I reviewed blood work results and recent hospitalization notes.  She is on Mounjaro and lost weight.  I did talk about stopping the extra Lamictal which could be causing tremors but patient reluctant to keep the medicine because it is helping her sleep.  After some discussion patient and her husband agree to try Lamictal 25 and like to have the prescription in case she needed.  We discussed polypharmacy as patient taking Klonopin, Thorazine, Lamictal, Lexapro.  She also taking Topamax.  Discussed polypharmacy can cause fatigue, dizziness worsening of memory.  Patient will try reducing the extra Lamictal but  keep the Lamictal 200 mg daily.  She like to have like to 25 mg also in case she needed.  Recommend to call us back if she has any question or any concern.  She feels the Klonopin helping her panic attack and we will keep the 0.25 mg twice a day.  Follow-up in 3 months   Follow Up Instructions:     I discussed the assessment and treatment plan with the patient. The patient was provided an opportunity to ask questions and all were answered. The patient agreed with the plan and demonstrated an understanding of the instructions.   The patient was advised to call back or seek an in-person evaluation if the symptoms worsen or if the condition fails to improve as anticipated.    Collaboration of Care: Other provider involved in patient's care AEB notes are available in epic to review.  Patient/Guardian was advised Release of Information must be obtained prior to any record release in order to collaborate their care with an outside provider. Patient/Guardian was advised if they have not already done so to contact the registration department to sign all necessary forms in order for [name] to release information regarding their care.   Consent: Patient/Guardian gives verbal consent for treatment  and assignment of benefits for services provided during this visit. Patient/Guardian expressed understanding and agreed to proceed.     I provided 31 minutes of non face to face time during this encounter.  Note: This document was prepared by [employer] voice dictation technology and any errors that results from this process are unintentional.    Cleotis Nipper, MD 07/08/2023

## 2023-07-08 NOTE — Telephone Encounter (Signed)
Patient called requesting medication refill on oxycodone , she will run out 7/18 and would like it sent to pharmacy on file

## 2023-07-11 ENCOUNTER — Telehealth (HOSPITAL_COMMUNITY): Payer: BC Managed Care – PPO | Admitting: Psychiatry

## 2023-07-12 ENCOUNTER — Encounter: Payer: Self-pay | Admitting: Primary Care

## 2023-07-12 ENCOUNTER — Ambulatory Visit (INDEPENDENT_AMBULATORY_CARE_PROVIDER_SITE_OTHER): Payer: BC Managed Care – PPO | Admitting: Primary Care

## 2023-07-12 VITALS — BP 90/58 | HR 110 | Temp 97.3°F | Ht 66.0 in | Wt 190.0 lb

## 2023-07-12 DIAGNOSIS — J9611 Chronic respiratory failure with hypoxia: Secondary | ICD-10-CM

## 2023-07-12 DIAGNOSIS — I951 Orthostatic hypotension: Secondary | ICD-10-CM | POA: Diagnosis not present

## 2023-07-12 DIAGNOSIS — J690 Pneumonitis due to inhalation of food and vomit: Secondary | ICD-10-CM | POA: Diagnosis not present

## 2023-07-12 DIAGNOSIS — D649 Anemia, unspecified: Secondary | ICD-10-CM | POA: Diagnosis not present

## 2023-07-12 NOTE — Assessment & Plan Note (Signed)
Recent hospital visit. Hospital labs, imaging, notes reviewed.  Exam today appears stable. Lungs clear.  Referral placed for home health speech therapy.  Continue to take precautions when eating.

## 2023-07-12 NOTE — Progress Notes (Signed)
Subjective:    Patient ID: ALANIA OVERHOLT, female    DOB: [birth year], 52 y.o.   MRN: [id number]  HPI  ROMI RATHEL is a very pleasant 52 y.o. female patient of Audria Nine, NP with a history of hypertension, CAD, renal artery stenosis, CHF, CVA, chronic respiratory failure, COPD, type 2 diabetes, chronic pain syndrome, osteoarthritis, MDD who presents today for hospital follow up.  Her husband joins [name] today.  She presented to [facility] ED on 07/03/23 for syncope. She was recently discharged for an admission of aspiration pneumonia. During her stay in the ED she was found to have multifocal pneumonia with oxygen saturation in the mid 90's on 6 liters of oxygen. She was admitted on sepsis protocol for further treatment.  During her hospital stay she was initiated on cefepime and vancomycin IV. She experienced orthostatic hypotension and was initiated on midodrine 5 mg TID and her metoprolol was held. She was noted to be anemic. Evaluated by speech therapy was noted to have delayed swallow initiation, decreased pharyngeal sensation. She was discharge home on 07/05/23 with a prescription for Augmentin and recommendation for PCP follow up.  Since her discharge home she's feeling about the same. She's more tired than usual. She continues to experience near syncope and syncope with positional changes sitting to standing. She does feel dizzy when laying to sitting. She's passed out three times since getting home, no injuries. She is compliant to midodrine 5 mg TID.   Her oxygen saturation has been in the high 80's with 6 liters of oxygen. She is followed with respiratory therapy through an agency who recommended a ventilator. She follows with pulmonology who is aware.   She completed the Augmentin prescription. She is hydrating with [employer]. She denies rectal bleeding, vaginal bleeding.    Review of Systems  Constitutional:  Positive for fatigue. Negative for fever.  HENT:   Negative for congestion.   Respiratory:  Negative for cough.   Gastrointestinal:  Positive for constipation.  Neurological:  Positive for dizziness, syncope and light-headedness.         Past Medical History:  Diagnosis Date   Acute respiratory failure with hypoxia (HCC) 06/24/2021   Arthralgia of temporomandibular joint    CAD, multiple vessel    a. 06/2016 Cath: ostLM 40%, ostLAD 40%, pLAD 95%, ost-pLCx 60%, pLCx 95%, mLCx 60%, mRCA 95%, D2 50%, LVSF nl;  b. 07/2016 CABG x 4 (LIMA->LAD, VG->Diag, VG->OM, VG->RCA); c. 08/2016 Cath: 3VD w/ 4/4 patent grafts. LAD distal to LIMA has diff dzs->Med rx; d. 08/2020 Cath: 4/4 patent grafts, native 3VD. EF 55-65%-->Med Rx.   Carotid arterial disease (HCC)    a. 07/2016 s/p R CEA; b. 02/2021 U/S: RICA 40-59%, LICA 1-39%.   Cerebrovascular disease    Clotting disorder (HCC)    Complex sleep apnea syndrome 10/03/2022   Depression    Diastolic dysfunction    a. 06/2016 Echo: EF 50-55%, mild inf wall HK, GR1DD, mild MR, RV sys fxn nl, mildly dilated LA, PASP nl; b. 06/2021 Echo: EF 60-65%, no rwma. Nl RV fxn.   Fatty liver disease, nonalcoholic 2016   History of blood transfusion    with heart surgery   HLD (hyperlipidemia)    Labile hypertension    a. prior renal ngiogram negative for RAS in 03/2016; b. catecholamines and metanephrines normal, mildly elevated renin with normal aldosterone and normal ratio in 02/2016   Myocardial infarction [facility]) 2017   Obesity    PAD (peripheral  artery disease) (HCC)    a. 09/2018 s/p L SFA stenting; b. 07/2019 Periph Angio: Patent m/d L SFA stent w/ 100% L SFA distal to stent. L AT 100d, L Peroneal diff dzs-->Med Rx; c. 02/2021 ABIs: stable @ 0.61 on R and 0.46 on L.   PTSD (post-traumatic stress disorder)    Stroke (HCC)    Tobacco abuse    Type 2 diabetes mellitus (HCC) 12/2015    Social History   Socioeconomic History   Marital status: Married    Spouse name: Not on file   Number of children: 1   Years of  education: 14   Highest education level: Not on file  Occupational History   Occupation: Disability  Tobacco Use   Smoking status: Former    Current packs/day: 0.00    Average packs/day: 0.5 packs/day for 30.0 years (15.0 ttl pk-yrs)    Types: Cigarettes    Start date: 56    Quit date: 2022    Years since quitting: 2.5    Passive exposure: Never   Smokeless tobacco: Never  Vaping Use   Vaping status: Every Day   Substances: Flavoring  Substance and Sexual Activity   Alcohol use: Not Currently    Alcohol/week: 0.0 standard drinks of alcohol    Comment: socially   Drug use: No   Sexual activity: Yes    Partners: Male    Birth control/protection: None  Other Topics Concern   Not on file  Social History Narrative   11/21/20   From: [name]   Living: with husband, [name] (2009) and special needs daughter    Work: disability       Family: daughter - [name] (special needs, lives with her) and 2 grown step children - [name] and [name] Client       Enjoys: play with dogs - 2 german Shepard, mut, and blue tick walker mix      Exercise: PAD limits exercise   Diet: diabetic diet and low potassium due to daughter      Safety   Seat belts: Yes    Guns: Yes  and secure   Safe in relationships: Yes    Social Determinants of Health   Financial Resource Strain: Not on file  Food Insecurity: No Food Insecurity (07/08/2023)   Hunger Vital Sign    Worried About Running Out of Food in the Last Year: Never true    Ran Out of Food in the Last Year: Never true  Transportation Needs: No Transportation Needs (07/08/2023)   PRAPARE - Administrator, Civil Service (Medical): No    Lack of Transportation (Non-Medical): No  Physical Activity: Not on file  Stress: Not on file  Social Connections: Not on file  Intimate Partner Violence: Not At Risk (07/03/2023)   Humiliation, Afraid, Rape, and Kick questionnaire    Fear of Current or Ex-Partner: No    Emotionally Abused:  No    Physically Abused: No    Sexually Abused: No    Past Surgical History:  Procedure Laterality Date   ABDOMINAL AORTOGRAM W/LOWER EXTREMITY [name] 10/15/2018   Procedure: ABDOMINAL AORTOGRAM W/LOWER EXTREMITY;  Surgeon: Iran Ouch, MD;  Location: [facility];  Service: Cardiovascular;  Laterality: [name];   ABDOMINAL AORTOGRAM W/LOWER EXTREMITY Bilateral 08/19/2019   Procedure: ABDOMINAL AORTOGRAM W/LOWER EXTREMITY;  Surgeon: Iran Ouch, MD;  Location: [facility];  Service: Cardiovascular;  Laterality: Bilateral;   CARDIAC CATHETERIZATION [name] 06/29/2016   Procedure: Left  Heart Cath and Coronary Angiography;  Surgeon: Antonieta Iba, MD;  Location: [facility] INVASIVE CV LAB;  Service: Cardiovascular;  Laterality: [name];   CARDIAC CATHETERIZATION [name] 08/29/2016   Procedure: Left Heart Cath and Cors/Grafts Angiography;  Surgeon: Iran Ouch, MD;  Location: [facility];  Service: Cardiovascular;  Laterality: [name];   CESAREAN SECTION     CHOLECYSTECTOMY     CORONARY ARTERY BYPASS GRAFT [name] 07/06/2016   Procedure: CORONARY ARTERY BYPASS GRAFTING (CABG) x four, using left internal mammary artery and right leg greater saphenous vein harvested endoscopically;  Surgeon: Kerin Perna, MD;  Location[facility];  Service: Open Heart Surgery;  Laterality: [name];   ENDARTERECTOMY Right 07/06/2016   Procedure: ENDARTERECTOMY CAROTID;  Surgeon: Larina Earthly, MD;  Location: [facility] OR;  Service: Vascular;  Laterality: Right;   ENDARTERECTOMY Right 04/27/2020   Procedure: REDO OF RIGHT ENDARTERECTOMY CAROTID;  Surgeon: Larina Earthly, MD;  [facility];  Service: Vascular;  Laterality: Right;   LEFT HEART CATH AND CORS/GRAFTS ANGIOGRAPHY [name] 08/24/2020   Procedure: LEFT HEART CATH AND CORS/GRAFTS ANGIOGRAPHY;  Surgeon: Iran Ouch, MD;  Location: [facility];  Service: Cardiovascular;  Laterality: [name];   LEFT HEART CATH AND CORS/GRAFTS ANGIOGRAPHY [name] 01/29/2022   Procedure: LEFT  HEART CATH AND CORS/GRAFTS ANGIOGRAPHY;  Surgeon: Iran Ouch, MD;  Location: [facility] INVASIVE CV LAB;  Service: Cardiovascular;  Laterality: [name];   PERIPHERAL VASCULAR CATHETERIZATION [name] 04/18/2016   Procedure: Renal Angiography;  Surgeon: Iran Ouch, MD;  Location: [facility];  Service: Cardiovascular;  Laterality: [name];   PERIPHERAL VASCULAR INTERVENTION Left 10/15/2018   Procedure: PERIPHERAL VASCULAR INTERVENTION;  Surgeon: Iran Ouch, MD;  Location: [facility];  Service: Cardiovascular;  Laterality: Left;  Left superficial femoral   TEE WITHOUT CARDIOVERSION [name] 07/06/2016   Procedure: TRANSESOPHAGEAL ECHOCARDIOGRAM (TEE);  Surgeon: Kerin Perna, MD;  Location: [facility] OR;  Service: Open Heart Surgery;  Laterality: [name];   TONSILLECTOMY      Family History  Adopted: Yes  Problem Relation Age of Onset   Diabetes Mother    Diabetes Father    Alcohol abuse Father    Heart disease Father    Drug abuse Father    Stroke Sister    Anxiety disorder Sister     No Active Allergies  Current Outpatient Medications on File Prior to Visit  Medication Sig Dispense Refill   acetaminophen (TYLENOL) 325 MG tablet Take 1-2 tablets (325-650 mg total) by mouth every 4 (four) hours as needed for mild pain.     albuterol (PROVENTIL) (2.5 MG/3ML) 0.083% nebulizer solution Take 3 mLs (2.5 mg total) by nebulization every 4 (four) hours as needed for wheezing or shortness of breath. 75 mL 2   aspirin EC 81 MG tablet Take 1 tablet (81 mg total) by mouth daily. Swallow whole. 90 tablet 0   chlorproMAZINE (THORAZINE) 25 MG tablet Take 3 tablets (75 mg total) by mouth at bedtime. 90 tablet 2   clonazePAM (KLONOPIN) 0.5 MG tablet Take 0.5 tablets (0.25 mg total) by mouth 2 (two) times daily. Take 1/2 tab twice daily. No dissolving tablets. 30 tablet 2   clopidogrel (PLAVIX) 75 MG tablet Take 1 tablet (75 mg total) by mouth daily. 90 tablet 3   Continuous Glucose Sensor (DEXCOM G7  SENSOR) MISC APPLY ONE SENSOR TO THE BACK OF YOUR UPPER ARM. REPLACE EVERY 10 DAYS. 3 each 5   cyclobenzaprine (FLEXERIL) 10  MG tablet TAKE ONE TABLET BY MOUTH AT BEDTIME 30 tablet 3   escitalopram (LEXAPRO) 20 MG tablet Take 1 tablet (20 mg total) by mouth daily. 30 tablet 2   Evolocumab (REPATHA SURECLICK) 140 MG/ML SOAJ Inject 140 mg into the skin as directed. Inject 1 Dose into the skin every 14 (fourteen) days. 6 mL 3   icosapent Ethyl (VASCEPA) 1 g capsule Take 2 g by mouth 2 (two) times daily.     lamoTRIgine (LAMICTAL) 200 MG tablet Take 1 tablet (200 mg total) by mouth at bedtime. 30 tablet 2   lamoTRIgine (LAMICTAL) 25 MG tablet Take 1 tablet (25 mg total) by mouth daily. 30 tablet 2   midodrine (PROAMATINE) 5 MG tablet Take 1 tablet (5 mg total) by mouth 3 (three) times daily with meals. 90 tablet 1   MOUNJARO 7.5 MG/0.5ML Pen INJECT THE CONTENTS OF ONE PEN UNDER THE SKIN WEEKLY ON THE SAME DAY EACH WEEK 6 mL 0   nitroGLYCERIN (NITROSTAT) 0.4 MG SL tablet Place 1 tablet (0.4 mg total) under the tongue every 5 (five) minutes as needed for chest pain. 25 tablet 1   Oxycodone HCl 10 MG TABS Take 1 tablet (10 mg total) by mouth 2 (two) times daily as needed. 60 tablet 0   pantoprazole (PROTONIX) 40 MG tablet TAKE ONE TABLET BY MOUTH ONE TIME DAILY 30 tablet 0   polyethylene glycol (MIRALAX / GLYCOLAX) 17 g packet Take 17 g by mouth 2 (two) times daily. 60 packet 0   pregabalin (LYRICA) 225 MG capsule Take 1 capsule (225 mg total) by mouth 2 (two) times daily. 60 capsule 3   senna (SENOKOT) 8.6 MG TABS tablet Take 1 tablet (8.6 mg total) by mouth daily. 30 tablet 0   Tiotropium Bromide-Olodaterol (STIOLTO RESPIMAT) 2.5-2.5 MCG/ACT AERS Inhale 2 puffs into the lungs daily. 4 g 0   topiramate (TOPAMAX) 25 MG tablet Take 1 tablet (25 mg total) by mouth at bedtime. 90 tablet 3   ULTICARE MINI PEN NEEDLES 31G X 6 MM MISC USE PEN NEEDLES TWO TIMES DAILY 100 each 3   No current  facility-administered medications on file prior to visit.    BP (!) 90/58 (BP Location: Right Arm, Patient Position: Standing, Cuff Size: Normal)   Pulse (!) 110   Temp (!) 97.3 F (36.3 C) (Temporal)   Ht 5\' 6"  (1.676 m)   Wt 190 lb (86.2 kg)   SpO2 92% Comment: 6 l o2  BMI 30.67 kg/m  Objective:   Physical Exam Cardiovascular:     Rate and Rhythm: Regular rhythm. Tachycardia present.  Pulmonary:     Effort: Pulmonary effort is normal.     Breath sounds: Normal breath sounds.  Musculoskeletal:     Cervical back: Neck supple.  Skin:    General: Skin is warm and dry.  Neurological:     Mental Status: She is alert and oriented to person, place, and time.  Psychiatric:        Mood and Affect: Mood normal.           Assessment & Plan:  Aspiration pneumonia of both lungs, unspecified aspiration pneumonia type, unspecified part of lung [facility]) Assessment & Plan: Recent hospital visit. Hospital labs, imaging, notes reviewed.  Exam today appears stable. Lungs clear.  Referral placed for home health speech therapy.  Continue to take precautions when eating.    Orders: -     Ambulatory referral to Home Health  Anemia, unspecified type Assessment &  Plan: Unclear cause.  Repeat CBC pending.  Orders: -     CBC  Chronic respiratory failure with hypoxia (HCC) Assessment & Plan: Repeat CO2 level pending. Following with pulmonology.   Continue oxygen at 6 liters/min  Orders: -     Comprehensive metabolic panel  Orthostatic hypotension Assessment & Plan: Likely autonomic dysfunction give CVA history.  Remain off metoprolol. Continue midodrine 5 mg TID.         Doreene Nest, NP

## 2023-07-12 NOTE — Assessment & Plan Note (Addendum)
Likely autonomic dysfunction give CVA history.  Remain off metoprolol. Continue midodrine 5 mg TID.

## 2023-07-12 NOTE — Patient Instructions (Signed)
Stop by the lab prior to leaving today. I will notify you of your results once received.   You will either be contacted via phone regarding your referral to speech therapy, or you may receive a letter on your MyChart portal from our referral team with instructions for scheduling an appointment. Please let us know if you have not been contacted by anyone within two weeks.  It was a pleasure meeting you!

## 2023-07-12 NOTE — Assessment & Plan Note (Signed)
Unclear cause.  Repeat CBC pending.

## 2023-07-12 NOTE — Assessment & Plan Note (Addendum)
Repeat CO2 level pending. Following with pulmonology.   Continue oxygen at 6 liters/min

## 2023-07-13 LAB — CBC
HCT: 33 % — ABNORMAL LOW (ref 35.0–45.0)
Hemoglobin: 9.8 g/dL — ABNORMAL LOW (ref 11.7–15.5)
MCH: 20.7 pg — ABNORMAL LOW (ref 27.0–33.0)
MCHC: 29.7 g/dL — ABNORMAL LOW (ref 32.0–36.0)
MCV: 69.6 fL — ABNORMAL LOW (ref 80.0–100.0)
MPV: 9.8 fL (ref 7.5–12.5)
Platelets: 554 10*3/uL — ABNORMAL HIGH (ref 140–400)
RBC: 4.74 10*6/uL (ref 3.80–5.10)
RDW: 16.9 % — ABNORMAL HIGH (ref 11.0–15.0)
WBC: 12.1 10*3/uL — ABNORMAL HIGH (ref 3.8–10.8)

## 2023-07-13 LAB — COMPREHENSIVE METABOLIC PANEL
AG Ratio: 1.2 (calc) (ref 1.0–2.5)
ALT: 8 U/L (ref 6–29)
AST: 11 U/L (ref 10–35)
Albumin: 3.9 g/dL (ref 3.6–5.1)
Alkaline phosphatase (APISO): 61 U/L (ref 31–125)
BUN: 10 mg/dL (ref 7–25)
CO2: 27 mmol/L (ref 20–32)
Calcium: 10.3 mg/dL — ABNORMAL HIGH (ref 8.6–10.2)
Chloride: 95 mmol/L — ABNORMAL LOW (ref 98–110)
Creat: 0.92 mg/dL (ref 0.50–0.99)
Globulin: 3.2 g/dL (calc) (ref 1.9–3.7)
Glucose, Bld: 113 mg/dL — ABNORMAL HIGH (ref 65–99)
Potassium: 3.5 mmol/L (ref 3.5–5.3)
Sodium: 135 mmol/L (ref 135–146)
Total Bilirubin: 0.3 mg/dL (ref 0.2–1.2)
Total Protein: 7.1 g/dL (ref 6.1–8.1)

## 2023-07-14 DIAGNOSIS — J690 Pneumonitis due to inhalation of food and vomit: Secondary | ICD-10-CM

## 2023-07-15 ENCOUNTER — Telehealth: Payer: Self-pay | Admitting: Registered Nurse

## 2023-07-15 ENCOUNTER — Telehealth (HOSPITAL_COMMUNITY): Payer: BC Managed Care – PPO | Admitting: Psychiatry

## 2023-07-15 DIAGNOSIS — G44029 Chronic cluster headache, not intractable: Secondary | ICD-10-CM

## 2023-07-15 MED ORDER — OXYCODONE HCL 10 MG PO TABS
10.0000 mg | ORAL_TABLET | Freq: Two times a day (BID) | ORAL | 0 refills | Status: DC | PRN
Start: 2023-07-15 — End: 2023-07-23

## 2023-07-15 NOTE — Telephone Encounter (Signed)
PMP was Reviewd.  Discharged summary was reviewed.  Oxycodone e-scribed to pharmacy, Ms. Matthewson is aware via My- Chart.

## 2023-07-16 ENCOUNTER — Inpatient Hospital Stay: Payer: BC Managed Care – PPO

## 2023-07-16 ENCOUNTER — Other Ambulatory Visit: Payer: Self-pay

## 2023-07-16 ENCOUNTER — Ambulatory Visit
Admission: RE | Admit: 2023-07-16 | Discharge: 2023-07-16 | Disposition: A | Discharge status: Home / Self Care | Payer: BC Managed Care – PPO | Source: Ambulatory Visit | Attending: Primary Care | Admitting: Primary Care

## 2023-07-16 ENCOUNTER — Encounter: Payer: Self-pay | Admitting: Cardiovascular Disease

## 2023-07-16 ENCOUNTER — Encounter: Payer: Self-pay | Admitting: Internal Medicine

## 2023-07-16 ENCOUNTER — Inpatient Hospital Stay
Admission: EM | Admit: 2023-07-16 | Discharge: 2023-07-19 | DRG: 871 | Disposition: A | Discharge status: Home / Self Care | Payer: BC Managed Care – PPO | Attending: Internal Medicine | Admitting: Internal Medicine

## 2023-07-16 ENCOUNTER — Ambulatory Visit: Payer: BC Managed Care – PPO | Attending: Cardiovascular Disease | Admitting: Cardiovascular Disease

## 2023-07-16 ENCOUNTER — Emergency Department: Payer: BC Managed Care – PPO

## 2023-07-16 VITALS — BP 117/79 | HR 124 | Ht 66.0 in | Wt 191.6 lb

## 2023-07-16 DIAGNOSIS — D75838 Other thrombocytosis: Secondary | ICD-10-CM | POA: Diagnosis present

## 2023-07-16 DIAGNOSIS — E872 Acidosis, unspecified: Secondary | ICD-10-CM | POA: Diagnosis not present

## 2023-07-16 DIAGNOSIS — I25118 Atherosclerotic heart disease of native coronary artery with other forms of angina pectoris: Secondary | ICD-10-CM

## 2023-07-16 DIAGNOSIS — E1151 Type 2 diabetes mellitus with diabetic peripheral angiopathy without gangrene: Secondary | ICD-10-CM | POA: Diagnosis not present

## 2023-07-16 DIAGNOSIS — D509 Iron deficiency anemia, unspecified: Secondary | ICD-10-CM | POA: Diagnosis not present

## 2023-07-16 DIAGNOSIS — I110 Hypertensive heart disease with heart failure: Secondary | ICD-10-CM | POA: Diagnosis present

## 2023-07-16 DIAGNOSIS — E785 Hyperlipidemia, unspecified: Secondary | ICD-10-CM | POA: Diagnosis present

## 2023-07-16 DIAGNOSIS — J441 Chronic obstructive pulmonary disease with (acute) exacerbation: Secondary | ICD-10-CM | POA: Diagnosis present

## 2023-07-16 DIAGNOSIS — Z7902 Long term (current) use of antithrombotics/antiplatelets: Secondary | ICD-10-CM

## 2023-07-16 DIAGNOSIS — E871 Hypo-osmolality and hyponatremia: Secondary | ICD-10-CM | POA: Diagnosis not present

## 2023-07-16 DIAGNOSIS — I951 Orthostatic hypotension: Secondary | ICD-10-CM

## 2023-07-16 DIAGNOSIS — Z813 Family history of other psychoactive substance abuse and dependence: Secondary | ICD-10-CM

## 2023-07-16 DIAGNOSIS — B888 Other specified infestations: Secondary | ICD-10-CM | POA: Diagnosis present

## 2023-07-16 DIAGNOSIS — Z7952 Long term (current) use of systemic steroids: Secondary | ICD-10-CM

## 2023-07-16 DIAGNOSIS — J690 Pneumonitis due to inhalation of food and vomit: Secondary | ICD-10-CM

## 2023-07-16 DIAGNOSIS — J189 Pneumonia, unspecified organism: Secondary | ICD-10-CM | POA: Diagnosis present

## 2023-07-16 DIAGNOSIS — I739 Peripheral vascular disease, unspecified: Secondary | ICD-10-CM

## 2023-07-16 DIAGNOSIS — J9611 Chronic respiratory failure with hypoxia: Secondary | ICD-10-CM | POA: Diagnosis not present

## 2023-07-16 DIAGNOSIS — A419 Sepsis, unspecified organism: Principal | ICD-10-CM | POA: Diagnosis present

## 2023-07-16 DIAGNOSIS — Z823 Family history of stroke: Secondary | ICD-10-CM

## 2023-07-16 DIAGNOSIS — Z794 Long term (current) use of insulin: Secondary | ICD-10-CM | POA: Diagnosis not present

## 2023-07-16 DIAGNOSIS — E669 Obesity, unspecified: Secondary | ICD-10-CM | POA: Diagnosis not present

## 2023-07-16 DIAGNOSIS — J342 Deviated nasal septum: Secondary | ICD-10-CM | POA: Diagnosis not present

## 2023-07-16 DIAGNOSIS — F32A Depression, unspecified: Secondary | ICD-10-CM | POA: Diagnosis present

## 2023-07-16 DIAGNOSIS — Z66 Do not resuscitate: Secondary | ICD-10-CM | POA: Diagnosis not present

## 2023-07-16 DIAGNOSIS — K760 Fatty (change of) liver, not elsewhere classified: Secondary | ICD-10-CM | POA: Diagnosis present

## 2023-07-16 DIAGNOSIS — R6520 Severe sepsis without septic shock: Secondary | ICD-10-CM | POA: Diagnosis present

## 2023-07-16 DIAGNOSIS — J9621 Acute and chronic respiratory failure with hypoxia: Secondary | ICD-10-CM | POA: Diagnosis present

## 2023-07-16 DIAGNOSIS — Z8249 Family history of ischemic heart disease and other diseases of the circulatory system: Secondary | ICD-10-CM

## 2023-07-16 DIAGNOSIS — E1169 Type 2 diabetes mellitus with other specified complication: Secondary | ICD-10-CM | POA: Diagnosis present

## 2023-07-16 DIAGNOSIS — Z9981 Dependence on supplemental oxygen: Secondary | ICD-10-CM

## 2023-07-16 DIAGNOSIS — I5032 Chronic diastolic (congestive) heart failure: Secondary | ICD-10-CM | POA: Diagnosis not present

## 2023-07-16 DIAGNOSIS — Z8673 Personal history of transient ischemic attack (TIA), and cerebral infarction without residual deficits: Secondary | ICD-10-CM

## 2023-07-16 DIAGNOSIS — I9589 Other hypotension: Secondary | ICD-10-CM | POA: Insufficient documentation

## 2023-07-16 DIAGNOSIS — I6529 Occlusion and stenosis of unspecified carotid artery: Secondary | ICD-10-CM

## 2023-07-16 DIAGNOSIS — I252 Old myocardial infarction: Secondary | ICD-10-CM

## 2023-07-16 DIAGNOSIS — Z1152 Encounter for screening for COVID-19: Secondary | ICD-10-CM

## 2023-07-16 DIAGNOSIS — Z951 Presence of aortocoronary bypass graft: Secondary | ICD-10-CM

## 2023-07-16 DIAGNOSIS — R55 Syncope and collapse: Secondary | ICD-10-CM | POA: Diagnosis not present

## 2023-07-16 DIAGNOSIS — D689 Coagulation defect, unspecified: Secondary | ICD-10-CM | POA: Diagnosis not present

## 2023-07-16 DIAGNOSIS — K219 Gastro-esophageal reflux disease without esophagitis: Secondary | ICD-10-CM | POA: Diagnosis present

## 2023-07-16 DIAGNOSIS — Z811 Family history of alcohol abuse and dependence: Secondary | ICD-10-CM

## 2023-07-16 DIAGNOSIS — Z87891 Personal history of nicotine dependence: Secondary | ICD-10-CM

## 2023-07-16 DIAGNOSIS — J168 Pneumonia due to other specified infectious organisms: Secondary | ICD-10-CM | POA: Diagnosis not present

## 2023-07-16 DIAGNOSIS — D649 Anemia, unspecified: Secondary | ICD-10-CM | POA: Diagnosis present

## 2023-07-16 DIAGNOSIS — F4310 Post-traumatic stress disorder, unspecified: Secondary | ICD-10-CM | POA: Diagnosis present

## 2023-07-16 DIAGNOSIS — M26629 Arthralgia of temporomandibular joint, unspecified side: Secondary | ICD-10-CM | POA: Diagnosis present

## 2023-07-16 DIAGNOSIS — E876 Hypokalemia: Secondary | ICD-10-CM | POA: Diagnosis present

## 2023-07-16 DIAGNOSIS — R059 Cough, unspecified: Secondary | ICD-10-CM | POA: Diagnosis not present

## 2023-07-16 DIAGNOSIS — F10939 Alcohol use, unspecified with withdrawal, unspecified: Secondary | ICD-10-CM | POA: Diagnosis present

## 2023-07-16 DIAGNOSIS — Z6829 Body mass index (BMI) 29.0-29.9, adult: Secondary | ICD-10-CM

## 2023-07-16 DIAGNOSIS — G4739 Other sleep apnea: Secondary | ICD-10-CM | POA: Diagnosis present

## 2023-07-16 DIAGNOSIS — Z515 Encounter for palliative care: Secondary | ICD-10-CM | POA: Diagnosis not present

## 2023-07-16 DIAGNOSIS — R531 Weakness: Secondary | ICD-10-CM | POA: Diagnosis not present

## 2023-07-16 DIAGNOSIS — K2960 Other gastritis without bleeding: Secondary | ICD-10-CM | POA: Diagnosis present

## 2023-07-16 DIAGNOSIS — R0602 Shortness of breath: Secondary | ICD-10-CM

## 2023-07-16 DIAGNOSIS — I2510 Atherosclerotic heart disease of native coronary artery without angina pectoris: Secondary | ICD-10-CM | POA: Diagnosis present

## 2023-07-16 DIAGNOSIS — Z818 Family history of other mental and behavioral disorders: Secondary | ICD-10-CM

## 2023-07-16 DIAGNOSIS — Z79899 Other long term (current) drug therapy: Secondary | ICD-10-CM

## 2023-07-16 DIAGNOSIS — J440 Chronic obstructive pulmonary disease with acute lower respiratory infection: Secondary | ICD-10-CM | POA: Diagnosis present

## 2023-07-16 DIAGNOSIS — I700 Atherosclerosis of aorta: Secondary | ICD-10-CM | POA: Diagnosis not present

## 2023-07-16 DIAGNOSIS — I69354 Hemiplegia and hemiparesis following cerebral infarction affecting left non-dominant side: Secondary | ICD-10-CM

## 2023-07-16 DIAGNOSIS — Z7189 Other specified counseling: Secondary | ICD-10-CM | POA: Diagnosis not present

## 2023-07-16 DIAGNOSIS — Z833 Family history of diabetes mellitus: Secondary | ICD-10-CM

## 2023-07-16 DIAGNOSIS — R918 Other nonspecific abnormal finding of lung field: Secondary | ICD-10-CM | POA: Diagnosis not present

## 2023-07-16 HISTORY — DX: Essential (primary) hypertension: I10

## 2023-07-16 LAB — COMPREHENSIVE METABOLIC PANEL
ALT: 10 U/L (ref 0–44)
AST: 19 U/L (ref 15–41)
Albumin: 3.3 g/dL — ABNORMAL LOW (ref 3.5–5.0)
Alkaline Phosphatase: 51 U/L (ref 38–126)
Anion gap: 9 (ref 5–15)
BUN: 12 mg/dL (ref 6–20)
CO2: 25 mmol/L (ref 22–32)
Calcium: 9.2 mg/dL (ref 8.9–10.3)
Chloride: 97 mmol/L — ABNORMAL LOW (ref 98–111)
Creatinine, Ser: 0.93 mg/dL (ref 0.44–1.00)
GFR, Estimated: >60 mL/min (ref >60)
Glucose, Bld: 271 mg/dL — ABNORMAL HIGH (ref 70–99)
Potassium: 3.6 mmol/L (ref 3.5–5.1)
Sodium: 131 mmol/L — ABNORMAL LOW (ref 135–145)
Total Bilirubin: 0.3 mg/dL (ref 0.3–1.2)
Total Protein: 7 g/dL (ref 6.5–8.1)

## 2023-07-16 LAB — BLOOD GAS, VENOUS
Acid-Base Excess: 5.1 mmol/L — ABNORMAL HIGH (ref 0.0–2.0)
Bicarbonate: 30.5 mmol/L — ABNORMAL HIGH (ref 20.0–28.0)
O2 Saturation: 87.9 %
Patient temperature: 37
pCO2, Ven: 47 mmHg (ref 44–60)
pH, Ven: 7.42 (ref 7.25–7.43)
pO2, Ven: 53 mmHg — ABNORMAL HIGH (ref 32–45)

## 2023-07-16 LAB — PROCALCITONIN: Procalcitonin: 1.56 ng/mL

## 2023-07-16 LAB — CBC WITH DIFFERENTIAL/PLATELET
Abs Immature Granulocytes: 0.09 10*3/uL — ABNORMAL HIGH (ref 0.00–0.07)
Basophils Absolute: 0.1 10*3/uL (ref 0.0–0.1)
Basophils Relative: 0 %
Eosinophils Absolute: 0 10*3/uL (ref 0.0–0.5)
Eosinophils Relative: 0 %
HCT: 28.1 % — ABNORMAL LOW (ref 36.0–46.0)
Hemoglobin: 8.6 g/dL — ABNORMAL LOW (ref 12.0–15.0)
Immature Granulocytes: 0 %
Lymphocytes Relative: 7 %
Lymphs Abs: 1.7 10*3/uL (ref 0.7–4.0)
MCH: 21.1 pg — ABNORMAL LOW (ref 26.0–34.0)
MCHC: 30.6 g/dL (ref 30.0–36.0)
MCV: 68.9 fL — ABNORMAL LOW (ref 80.0–100.0)
Monocytes Absolute: 0.6 10*3/uL (ref 0.1–1.0)
Monocytes Relative: 2 %
Neutro Abs: 21.8 10*3/uL — ABNORMAL HIGH (ref 1.7–7.7)
Neutrophils Relative %: 91 %
Platelets: 575 10*3/uL — ABNORMAL HIGH (ref 150–400)
RBC: 4.08 MIL/uL (ref 3.87–5.11)
RDW: 18.1 % — ABNORMAL HIGH (ref 11.5–15.5)
Smear Review: NORMAL
WBC: 24.2 10*3/uL — ABNORMAL HIGH (ref 4.0–10.5)
nRBC: 0 % (ref 0.0–0.2)

## 2023-07-16 LAB — D-DIMER, QUANTITATIVE: D-Dimer, Quant: 1.24 ug/mL-FEU — ABNORMAL HIGH (ref 0.00–0.50)

## 2023-07-16 LAB — RESP PANEL BY RT-PCR (RSV, FLU A&B, COVID)  RVPGX2
Influenza A by PCR: NEGATIVE
Influenza B by PCR: NEGATIVE
Resp Syncytial Virus by PCR: NEGATIVE
SARS Coronavirus 2 by RT PCR: NEGATIVE

## 2023-07-16 LAB — LACTIC ACID, PLASMA
Lactic Acid, Venous: 3 mmol/L (ref 0.5–1.9)
Lactic Acid, Venous: 1.7 mmol/L (ref 0.5–1.9)

## 2023-07-16 LAB — BRAIN NATRIURETIC PEPTIDE: B Natriuretic Peptide: 43.9 pg/mL (ref 0.0–100.0)

## 2023-07-16 LAB — TROPONIN I (HIGH SENSITIVITY): Troponin I (High Sensitivity): 11 ng/L (ref <18)

## 2023-07-16 MED ORDER — IOHEXOL 350 MG/ML SOLN
75.0000 mL | Freq: Once | INTRAVENOUS | Status: AC | PRN
  Administered 2023-07-16: 75 mL via INTRAVENOUS

## 2023-07-16 MED ORDER — HEPARIN SODIUM (PORCINE) 5000 UNIT/ML IJ SOLN
5000.0000 [IU] | Freq: Three times a day (TID) | INTRAMUSCULAR | Status: DC
  Administered 2023-07-17 – 2023-07-19 (×7): 5000 [IU] via SUBCUTANEOUS
  Filled 2023-07-16 (×7): qty 1

## 2023-07-16 MED ORDER — CHLORPROMAZINE HCL 50 MG PO TABS: 75.0000 mg | ORAL_TABLET | Freq: Every day | ORAL | Status: DC

## 2023-07-16 MED ORDER — VANCOMYCIN HCL 1750 MG/350ML IV SOLN
1750.0000 mg | Freq: Once | INTRAVENOUS | Status: AC
  Administered 2023-07-16: 1750 mg via INTRAVENOUS
  Filled 2023-07-16: qty 350

## 2023-07-16 MED ORDER — TOPIRAMATE 25 MG PO TABS: 25.0000 mg | ORAL_TABLET | Freq: Every day | ORAL | Status: DC

## 2023-07-16 MED ORDER — SODIUM CHLORIDE 0.9 % IV SOLN
2.0000 g | Freq: Once | INTRAVENOUS | Status: AC
  Administered 2023-07-16: 2 g via INTRAVENOUS
  Filled 2023-07-16: qty 12.5

## 2023-07-16 MED ORDER — LAMOTRIGINE 100 MG PO TABS: 200.0000 mg | ORAL_TABLET | Freq: Every day | ORAL | Status: DC

## 2023-07-16 MED ORDER — SODIUM CHLORIDE 0.9 % IV SOLN
2.0000 g | INTRAVENOUS | Status: DC
  Administered 2023-07-17 – 2023-07-19 (×3): 2 g via INTRAVENOUS
  Filled 2023-07-16 (×3): qty 20

## 2023-07-16 MED ORDER — ALBUTEROL SULFATE (2.5 MG/3ML) 0.083% IN NEBU: 2.5000 mg | INHALATION_SOLUTION | RESPIRATORY_TRACT | Status: DC | PRN

## 2023-07-16 MED ORDER — LAMOTRIGINE 25 MG PO TABS: 25.0000 mg | ORAL_TABLET | Freq: Every day | ORAL | Status: DC

## 2023-07-16 MED ORDER — MIDODRINE HCL 5 MG PO TABS
5.0000 mg | ORAL_TABLET | Freq: Three times a day (TID) | ORAL | Status: DC
  Administered 2023-07-17 – 2023-07-18 (×4): 5 mg via ORAL
  Filled 2023-07-16 (×4): qty 1

## 2023-07-16 MED ORDER — VANCOMYCIN HCL IN DEXTROSE 1-5 GM/200ML-% IV SOLN: 1000.0000 mg | Freq: Once | INTRAVENOUS | Status: DC

## 2023-07-16 MED ORDER — UMECLIDINIUM BROMIDE 62.5 MCG/ACT IN AEPB
1.0000 | INHALATION_SPRAY | Freq: Every day | RESPIRATORY_TRACT | Status: DC
  Administered 2023-07-17 – 2023-07-19 (×3): 1 via RESPIRATORY_TRACT
  Filled 2023-07-16 (×3): qty 7

## 2023-07-16 MED ORDER — VANCOMYCIN HCL 1750 MG/350ML IV SOLN
1750.0000 mg | INTRAVENOUS | Status: DC
  Filled 2023-07-16: qty 350

## 2023-07-16 MED ORDER — METOPROLOL SUCCINATE ER 25 MG PO TB24: 25.0000 mg | ORAL_TABLET | Freq: Every day | ORAL | 1 refills | Status: DC

## 2023-07-16 MED ORDER — PREGABALIN 75 MG PO CAPS
225.0000 mg | ORAL_CAPSULE | Freq: Two times a day (BID) | ORAL | Status: DC
  Administered 2023-07-17 – 2023-07-19 (×5): 225 mg via ORAL
  Filled 2023-07-16 (×5): qty 3

## 2023-07-16 MED ORDER — FLUDROCORTISONE ACETATE 0.1 MG PO TABS
0.1000 mg | ORAL_TABLET | Freq: Every day | ORAL | Status: DC
  Administered 2023-07-17 – 2023-07-19 (×3): 0.1 mg via ORAL
  Filled 2023-07-16 (×3): qty 1

## 2023-07-16 MED ORDER — SODIUM CHLORIDE 0.9 % IV BOLUS
1000.0000 mL | Freq: Once | INTRAVENOUS | Status: AC
  Administered 2023-07-16: 1000 mL via INTRAVENOUS

## 2023-07-16 MED ORDER — SODIUM CHLORIDE 0.9 % IV SOLN
500.0000 mg | INTRAVENOUS | Status: DC
  Administered 2023-07-17: 500 mg via INTRAVENOUS
  Filled 2023-07-16: qty 5

## 2023-07-16 MED ORDER — FERROUS SULFATE 324 (65 FE) MG PO TBEC: 324.0000 mg | DELAYED_RELEASE_TABLET | Freq: Two times a day (BID) | ORAL | 2 refills | Status: DC

## 2023-07-16 MED ORDER — CLOPIDOGREL BISULFATE 75 MG PO TABS
75.0000 mg | ORAL_TABLET | Freq: Every day | ORAL | Status: DC
  Administered 2023-07-17 – 2023-07-19 (×3): 75 mg via ORAL
  Filled 2023-07-16 (×3): qty 1

## 2023-07-16 MED ORDER — HEPARIN SODIUM (PORCINE) 5000 UNIT/ML IJ SOLN: 5000.0000 [IU] | Freq: Three times a day (TID) | INTRAMUSCULAR | Status: DC

## 2023-07-16 MED ORDER — ARFORMOTEROL TARTRATE 15 MCG/2ML IN NEBU
15.0000 ug | INHALATION_SOLUTION | Freq: Two times a day (BID) | RESPIRATORY_TRACT | Status: DC
  Administered 2023-07-17 – 2023-07-19 (×5): 15 ug via RESPIRATORY_TRACT
  Filled 2023-07-16 (×6): qty 2

## 2023-07-16 MED ORDER — ACETAMINOPHEN 325 MG PO TABS
325.0000 mg | ORAL_TABLET | ORAL | Status: DC | PRN
  Administered 2023-07-19: 650 mg via ORAL
  Filled 2023-07-16: qty 2

## 2023-07-16 MED ORDER — FLUDROCORTISONE ACETATE 0.1 MG PO TABS: 0.1000 mg | ORAL_TABLET | Freq: Every day | ORAL | 1 refills | Status: DC

## 2023-07-16 MED ORDER — ESCITALOPRAM OXALATE 10 MG PO TABS
20.0000 mg | ORAL_TABLET | Freq: Every day | ORAL | Status: DC
  Administered 2023-07-17 – 2023-07-19 (×3): 20 mg via ORAL
  Filled 2023-07-16 (×4): qty 2

## 2023-07-16 NOTE — Assessment & Plan Note (Signed)
SpO2: 99 % On 6 L [place].

## 2023-07-16 NOTE — Assessment & Plan Note (Signed)
Strict I/O Daily weight.

## 2023-07-16 NOTE — Assessment & Plan Note (Signed)
Left shoulder xray malalignment.

## 2023-07-16 NOTE — Assessment & Plan Note (Addendum)
2/2 hypotension/ hypoperfusion. Pt has received IVF and BP has improved as has lactic. But she is still hypotensive.  We will get echo with bubble / cta  chest and MRI brain .  vancomycin 1,750 mg (07/16/23 2055)  No intake or output data in the 24 hours ending 07/16/23 2159

## 2023-07-16 NOTE — Sepsis Progress Note (Signed)
Elink monitoring for the code sepsis protocol.  

## 2023-07-16 NOTE — Telephone Encounter (Signed)
Yes she needs to be evaluated in ED. ?

## 2023-07-16 NOTE — Telephone Encounter (Signed)
Beth, please advise. Thanks 

## 2023-07-16 NOTE — Assessment & Plan Note (Signed)
Contact Isolation

## 2023-07-16 NOTE — H&P (Signed)
History and Physical    Patient: Erika [name] ZOX:[id number] DOB: Apr 15, 1971 DOA: 07/16/2023 DOS: the patient was seen and examined on 07/16/2023 PCP: Eden Emms, NP  Patient coming from: Home   Chief Complaint:  Chief Complaint  Patient presents with   Shortness of Breath    HPI: TEA COLLUMS is a 52 y.o. female with medical history significant for heart disease, hypertension, chronic respiratory failure on 6 L at home, hypertension, obesity presenting from cardiology office episode of passing out. Patient was discharged on 12 July for chronic respiratory failure attributed aspiration pneumonia and was treated with Unasyn (did not discharge.  Patient's hypotension has been suspected to be from autonomic dysfunction patient started Florinef by cardiology today along with midodrine EKG changes for her syncopal episode and hypotension.  In the emergency room today patient is alert awake and oriented afebrile.  Patient denies any history of frequent treatment with steroids. Initial vitals show heart rate of 102 respirations of 18 temperature of 99.7 O2 sats of 96% on 6 L. Venous blood gas shows pO2 of 53 pCO2 of 47 normal pH. CMP shows hyponatremia of 131.  Glucose of 271. Normal LFTs. Troponin of 11. Lactic acid of 3.0 resolved to repeat of 1.7. CBC is abnormal 24.2 hemoglobin of 8.6 platelet count of 575 with MCV of 68.9 and RDW of 18.1. Chest xray shows: There are patchy infiltrates in both lower lung fields. There is interval worsening of infiltrates in left lower lung field. Findings suggest possible multifocal pneumonia with interval worsening in left lower lung field.There is possible offset in alignment of left humeral head and glenoid. If clinically warranted, routine radiographs of left shoulder may be considered.   In the Ed pt got:  azithromycin     cefTRIAXone (ROCEPHIN)  IV     vancomycin 1,750 mg (07/16/23 2055)    Review of Systems: Review of Systems   Neurological:  Positive for dizziness and loss of consciousness.  All other systems reviewed and are negative.  Past Medical History:  Diagnosis Date   Acute respiratory failure with hypoxia (HCC) 06/24/2021   Arthralgia of temporomandibular joint    CAD, multiple vessel    a. 06/2016 Cath: ostLM 40%, ostLAD 40%, pLAD 95%, ost-pLCx 60%, pLCx 95%, mLCx 60%, mRCA 95%, D2 50%, LVSF nl;  b. 07/2016 CABG x 4 (LIMA->LAD, VG->Diag, VG->OM, VG->RCA); c. 08/2016 Cath: 3VD w/ 4/4 patent grafts. LAD distal to LIMA has diff dzs->Med rx; d. 08/2020 Cath: 4/4 patent grafts, native 3VD. EF 55-65%-->Med Rx.   Carotid arterial disease (HCC)    a. 07/2016 s/p R CEA; b. 02/2021 U/S: RICA 40-59%, LICA 1-39%.   Cerebrovascular disease    Clotting disorder (HCC)    Complex sleep apnea syndrome 10/03/2022   Depression    Diastolic dysfunction    a. 06/2016 Echo: EF 50-55%, mild inf wall HK, GR1DD, mild MR, RV sys fxn nl, mildly dilated LA, PASP nl; b. 06/2021 Echo: EF 60-65%, no rwma. Nl RV fxn.   Essential hypertension    Essential hypertension, malignant 06/28/2016   Fatty liver disease, nonalcoholic 2016   History of blood transfusion    with heart surgery   History of MI (myocardial infarction) (July 2017) 08/02/2020   HLD (hyperlipidemia)    HTN (hypertension), malignant 10/20/2013   Labile hypertension    a. prior renal ngiogram negative for RAS in 03/2016; b. catecholamines and metanephrines normal, mildly elevated renin with normal aldosterone and normal ratio in 02/2016  Mild tobacco abuse in early remission 06/28/2016   Myocardial infarction [facility]) 2017   Obesity    PAD (peripheral artery disease) (HCC)    a. 09/2018 s/p L SFA stenting; b. 07/2019 Periph Angio: Patent m/d L SFA stent w/ 100% L SFA distal to stent. L AT 100d, L Peroneal diff dzs-->Med Rx; c. 02/2021 ABIs: stable @ 0.61 on R and 0.46 on L.   PTSD (post-traumatic stress disorder)    Reflux gastritis 07/01/2023   Stroke (HCC)    Tobacco  abuse    Type 2 diabetes mellitus (HCC) 12/2015   Past Surgical History:  Procedure Laterality Date   ABDOMINAL AORTOGRAM W/LOWER EXTREMITY [name] 10/15/2018   Procedure: ABDOMINAL AORTOGRAM W/LOWER EXTREMITY;  Surgeon: Iran Ouch, MD;  Location: [facility];  Service: Cardiovascular;  Laterality: [name];   ABDOMINAL AORTOGRAM W/LOWER EXTREMITY Bilateral 08/19/2019   Procedure: ABDOMINAL AORTOGRAM W/LOWER EXTREMITY;  Surgeon: Iran Ouch, MD;  Location: [facility];  Service: Cardiovascular;  Laterality: Bilateral;   CARDIAC CATHETERIZATION [name] 06/29/2016   Procedure: Left Heart Cath and Coronary Angiography;  Surgeon: Antonieta Iba, MD;  Location: [facility] INV[facility] CV [facility];  Service: Cardiovascular;  Laterality: [name];   CARDIAC CATHETERIZATION [name] 08/29/2016   Procedure: Left Heart Cath and Cors/Grafts Angiography;  Surgeon: Iran Ouch, MD;  Location: [facility];  Service: Cardiovascular;  Laterality: [name];   CESAREAN SECTION     CHOLECYSTECTOMY     CORONARY ARTERY BYPASS GRAFT [name] 07/06/2016   Procedure: CORONARY ARTERY BYPASS GRAFTING (CABG) x four, using left internal mammary artery and right leg greater saphenous vein harvested endoscopically;  Surgeon: Kerin Perna, MD;  Location: [facility] OR;  Service: Open Heart Surgery;  Laterality: [name];   ENDARTERECTOMY Right 07/06/2016   Procedure: ENDARTERECTOMY CAROTID;  Surgeon: Larina Earthly, MD;  Location: [facility] OR;  Service: Vascular;  Laterality: Right;   ENDARTERECTOMY Right 04/27/2020   Procedure: REDO OF RIGHT ENDARTERECTOMY CAROTID;  Surgeon: Larina Earthly, MD;  Location: [facility] OR;  Service: Vascular;  Laterality: Right;   LEFT HEART CATH AND CORS/GRAFTS ANGIOGRAPHY [name] 08/24/2020   Procedure: LEFT HEART CATH AND CORS/GRAFTS ANGIOGRAPHY;  Surgeon: Iran Ouch, MD;  Location: [facility];  Service: Cardiovascular;  Laterality: [name];   LEFT HEART CATH AND CORS/GRAFTS ANGIOGRAPHY [name] 01/29/2022   Procedure: LEFT HEART  CATH AND CORS/GRAFTS ANGIOGRAPHY;  Surgeon: Iran Ouch, MD;  Location: [facility] INV[facility] CV [facility];  Service: Cardiovascular;  Laterality: [name];   PERIPHERAL VASCULAR CATHETERIZATION [name] 04/18/2016   Procedure: Renal Angiography;  Surgeon: Iran Ouch, MD;  Location: [facility];  Service: Cardiovascular;  Laterality: [name];   PERIPHERAL VASCULAR INTERVENTION Left 10/15/2018   Procedure: PERIPHERAL VASCULAR INTERVENTION;  Surgeon: Iran Ouch, MD;  Location: [facility];  Service: Cardiovascular;  Laterality: Left;  Left superficial femoral   TEE WITHOUT CARDIOVERSION [name] 07/06/2016   Procedure: TRANSESOPHAGEAL ECHOCARDIOGRAM (TEE);  Surgeon: Kerin Perna, MD;  Location: [facility] OR;  Service: Open Heart Surgery;  Laterality: [name];   TONSILLECTOMY     Social History:  reports that she quit smoking about 2 years ago. Her smoking use included cigarettes. She started smoking about 32 years ago. She has a 15 pack-year smoking history. She has never been exposed to tobacco smoke. She has never used smokeless tobacco. She reports that she does not currently use alcohol. She reports that she does not use drugs.  No Known Allergies  Family History  Adopted: Yes  Problem Relation Age of Onset   Diabetes Mother    Diabetes Father    Alcohol abuse Father    Heart disease Father    Drug abuse Father    Stroke Sister    Anxiety disorder Sister     Prior to Admission medications   Medication Sig Start Date End Date Taking? Authorizing Provider  acetaminophen (TYLENOL) 325 MG tablet Take 1-2 tablets (325-650 mg total) by mouth every 4 (four) hours as needed for mild pain. 08/01/21   Love, Evlyn Kanner, PA-C  albuterol (PROVENTIL) (2.5 MG/3ML) 0.083% nebulizer solution Take 3 mLs (2.5 mg total) by nebulization every 4 (four) hours as needed for wheezing or shortness of breath. 07/05/23 08/04/23  Enedina Finner, MD  chlorproMAZINE (THORAZINE) 25 MG tablet Take 3 tablets (75 mg total) by mouth at  bedtime. 07/08/23   Arfeen, Phillips Grout, MD  clonazePAM (KLONOPIN) 0.5 MG tablet Take 0.5 tablets (0.25 mg total) by mouth 2 (two) times daily. Take 1/2 tab twice daily. No dissolving tablets. 07/08/23   Arfeen, Phillips Grout, MD  clopidogrel (PLAVIX) 75 MG tablet Take 1 tablet (75 mg total) by mouth daily. 03/06/23   Sater, Pearletha Furl, MD  Continuous Glucose Sensor (DEXCOM G7 SENSOR) MISC APPLY ONE SENSOR TO THE BACK OF YOUR UPPER ARM. REPLACE EVERY 10 DAYS. 04/12/23   Eden Emms, NP  cyclobenzaprine (FLEXERIL) 10 MG tablet TAKE ONE TABLET BY MOUTH AT BEDTIME 06/26/23   Raulkar, Drema Pry, MD  escitalopram (LEXAPRO) 20 MG tablet Take 1 tablet (20 mg total) by mouth daily. 07/08/23 10/06/23  Arfeen, Phillips Grout, MD  Evolocumab (REPATHA SURECLICK) 140 MG/ML SOAJ Inject 140 mg into the skin as directed. Inject 1 Dose into the skin every 14 (fourteen) days. 10/31/22   Furth, Cadence H, PA-C  ferrous sulfate 324 (65 Fe) MG TBEC Take 1 tablet (324 mg total) by mouth 2 (two) times daily. 07/16/23   Iran Ouch, MD  fludrocortisone (FLORINEF) 0.1 MG tablet Take 1 tablet (0.1 mg total) by mouth daily. 07/16/23   Iran Ouch, MD  icosapent Ethyl (VASCEPA) 1 g capsule Take 2 g by mouth 2 (two) times daily.    [provider]  lamoTRIgine (LAMICTAL) 200 MG tablet Take 1 tablet (200 mg total) by mouth at bedtime. 07/08/23   Arfeen, Phillips Grout, MD  lamoTRIgine (LAMICTAL) 25 MG tablet Take 1 tablet (25 mg total) by mouth daily. 07/08/23 10/06/23  Arfeen, Phillips Grout, MD  metoprolol succinate (TOPROL-XL) 25 MG 24 hr tablet Take 1 tablet (25 mg total) by mouth daily. 07/16/23   Iran Ouch, MD  midodrine (PROAMATINE) 5 MG tablet Take 1 tablet (5 mg total) by mouth 3 (three) times daily with meals. 07/05/23   Enedina Finner, MD  MOUNJARO 7.5 MG/0.5ML Pen INJECT THE CONTENTS OF ONE PEN UNDER THE SKIN WEEKLY ON THE SAME DAY EACH WEEK 06/21/23   Eden Emms, NP  nitroGLYCERIN (NITROSTAT) 0.4 MG SL tablet Place 1 tablet (0.4 mg  total) under the tongue every 5 (five) minutes as needed for chest pain. 03/15/21   Iran Ouch, MD  Oxycodone HCl 10 MG TABS Take 1 tablet (10 mg total) by mouth 2 (two) times daily as needed. 07/15/23   Jones Bales, NP  pantoprazole (PROTONIX) 40 MG tablet TAKE ONE TABLET BY MOUTH ONE TIME DAILY 06/12/23   Raulkar, Drema Pry, MD  pregabalin (LYRICA) 225 MG capsule Take 1 capsule (225  mg total) by mouth 2 (two) times daily. 04/11/23   Jones Bales, NP  Tiotropium Bromide-Olodaterol (STIOLTO RESPIMAT) 2.5-2.5 MCG/ACT AERS Inhale 2 puffs into the lungs daily. 06/24/23   Glenford Bayley, NP  topiramate (TOPAMAX) 25 MG tablet Take 1 tablet (25 mg total) by mouth at bedtime. 05/16/23   Raulkar, Drema Pry, MD  ULTICARE MINI PEN NEEDLES 31G X 6 MM MISC USE PEN NEEDLES TWO TIMES DAILY 12/26/22   Eden Emms, NP     Vitals:   07/16/23 1821 07/16/23 1822 07/16/23 1825 07/16/23 2130  BP: 129/76   106/60  Pulse: (!) 102   91  Resp: 18   18  Temp: 99.7 F (37.6 C)     TempSrc: Oral     SpO2: 96%  94% 99%  Weight:  82.1 kg    Height:  5\' 6"  (1.676 m)     Physical Exam Vitals and nursing note reviewed.  Constitutional:      General: She is not in acute distress.    Appearance: She is obese. She is ill-appearing.     Interventions: Nasal cannula in place.  HENT:     Head: Normocephalic and atraumatic.     Right Ear: Hearing normal.     Left Ear: Hearing normal.     Nose: Nose normal. No nasal deformity.     Mouth/Throat:     Lips: Pink.     Tongue: No lesions.     Pharynx: Oropharynx is clear.  Eyes:     General: Lids are normal.     Extraocular Movements: Extraocular movements intact.  Cardiovascular:     Rate and Rhythm: Normal rate and regular rhythm.     Heart sounds: Normal heart sounds.  Pulmonary:     Effort: Pulmonary effort is normal. No tachypnea or bradypnea.     Breath sounds: Examination of the right-middle field reveals rales. Examination of the left-middle  field reveals rales. Examination of the right-lower field reveals rales. Examination of the left-lower field reveals rales. Rales present.  Abdominal:     General: Bowel sounds are normal. There is no distension.     Palpations: Abdomen is soft. There is no mass.     Tenderness: There is no abdominal tenderness.  Musculoskeletal:     Right lower leg: No edema.     Left lower leg: No edema.  Skin:    General: Skin is warm.  Neurological:     General: No focal deficit present.     Mental Status: She is alert and oriented to person, place, and time.     Cranial Nerves: Cranial nerves 2-12 are intact.  Psychiatric:        Attention and Perception: Attention normal.        Mood and Affect: Mood normal.        Speech: Speech normal.        Behavior: Behavior normal. Behavior is cooperative.      Labs on Admission: I have personally reviewed following labs and imaging studies  CBC: Recent Labs  Lab 07/12/23 1220 07/16/23 1836  WBC 12.1* 24.2*  NEUTROABS  --  21.8*  HGB 9.8* 8.6*  HCT 33.0* 28.1*  MCV 69.6* 68.9*  PLT 554* 575*   Basic Metabolic Panel: Recent Labs  Lab 07/12/23 1220 07/16/23 1836  NA 135 131*  K 3.5 3.6  CL 95* 97*  CO2 27 25  GLUCOSE 113* 271*  BUN 10 12  CREATININE 0.92 0.93  CALCIUM 10.3* 9.2   GFR: Estimated Creatinine Clearance: 79 mL/min (by C-G formula based on SCr of 0.93 mg/dL). Liver Function Tests: Recent Labs  Lab 07/12/23 1220 07/16/23 1836  AST 11 19  ALT 8 10  ALKPHOS  --  51  BILITOT 0.3 0.3  PROT 7.1 7.0  ALBUMIN  --  3.3*   No results for input(s): "LIPASE", "AMYLASE" in the last 168 hours. No results for input(s): "AMMONIA" in the last 168 hours. Coagulation Profile: No results for input(s): "INR", "PROTIME" in the last 168 hours. Cardiac Enzymes: No results for input(s): "CKTOTAL", "CKMB", "CKMBINDEX", "TROPONINI" in the last 168 hours. BNP (last 3 results) No results for input(s): "PROBNP" in the last 8760  hours. HbA1C: No results for input(s): "HGBA1C" in the last 72 hours. CBG: No results for input(s): "GLUCAP" in the last 168 hours. Lipid Profile: No results for input(s): "CHOL", "HDL", "LDLCALC", "TRIG", "CHOLHDL", "LDLDIRECT" in the last 72 hours. Thyroid Function Tests: No results for input(s): "TSH", "T4TOTAL", "FREET4", "T3FREE", "THYROIDAB" in the last 72 hours. Anemia Panel: No results for input(s): "VITAMINB12", "FOLATE", "FERRITIN", "TIBC", "IRON", "RETICCTPCT" in the last 72 hours. Urine analysis: Urinalysis    Component Value Date/Time   COLORURINE YELLOW (A) 07/03/2023 1611   APPEARANCEUR HAZY (A) 07/03/2023 1611   APPEARANCEUR Clear 09/11/2021 1413   LABSPEC 1.035 (H) 07/03/2023 1611   LABSPEC 1.024 01/11/2015 1022   PHURINE 7.0 07/03/2023 1611   GLUCOSEU NEGATIVE 07/03/2023 1611   GLUCOSEU >=1000 (A) 10/19/2019 1034   HGBUR NEGATIVE 07/03/2023 1611   BILIRUBINUR NEGATIVE 07/03/2023 1611   BILIRUBINUR Negative 09/11/2021 1413   BILIRUBINUR Negative 01/11/2015 1022   KETONESUR NEGATIVE 07/03/2023 1611   PROTEINUR NEGATIVE 07/03/2023 1611   UROBILINOGEN 1.0 10/19/2019 1049   UROBILINOGEN 1.0 10/19/2019 1034   NITRITE NEGATIVE 07/03/2023 1611   LEUKOCYTESUR NEGATIVE 07/03/2023 1611   LEUKOCYTESUR Negative 01/11/2015 1022    Unresulted Labs (From admission, onward)     Start     Ordered   07/17/23 0500  Comprehensive metabolic panel  Tomorrow morning,   R        07/16/23 2219   07/17/23 0500  CBC  Tomorrow morning,   R        07/16/23 2219   07/16/23 2219  MRSA Next Gen by PCR, Nasal  (Non-severe pneumonia (non-ICU care) in adult without resistant organism risk factors.)  Once,   R        07/16/23 2219   07/16/23 2216  HIV Antibody (routine testing w rflx)  (HIV Antibody (Routine testing w reflex) panel)  Once,   R        07/16/23 2219   07/16/23 2215  Lamotrigine level  Once,   R        07/16/23 2219   07/16/23 1906  Blood Culture (routine x 2)  (Septic  presentation on arrival (screening labs, nursing and treatment orders for obvious sepsis))  BLOOD CULTURE X 2,   STAT      07/16/23 1907             Radiological Exams on Admission: DG Chest 2 View  Result Date: 07/16/2023 CLINICAL DATA:  Cough, pneumonia EXAM: CHEST - 2 VIEW COMPARISON:  Previous studies including the examination of 07/03/2023 FINDINGS: There is poor inspiration. There are patchy infiltrates in both lower lung fields. There is interval worsening of infiltrates in left lower lung field. Costophrenic angles are clear. There is no pneumothorax. There is previous coronary bypass surgery.  There is possible offset in alignment of left humeral head and glenoid. This finding is not fully evaluated. IMPRESSION: There are patchy infiltrates in both lower lung fields. There is interval worsening of infiltrates in left lower lung field. Findings suggest possible multifocal pneumonia with interval worsening in left lower lung field. There is possible offset in alignment of left humeral head and glenoid. If clinically warranted, routine radiographs of left shoulder may be considered. Electronically Signed   By: Ernie Avena M.D.   On: 07/16/2023 15:37     Data Reviewed: Relevant notes from primary care and specialist visits, past discharge summaries as available in EHR, including Care Everywhere. Prior diagnostic testing as pertinent to current admission diagnoses Updated medications and problem lists for reconciliation ED course, including vitals, labs, imaging, treatment and response to treatment Triage notes, nursing and pharmacy notes and ED provider's notes Notable results as noted in HPI Assessment and Plan: * Syncope and collapse 2/2 hypotension/ hypoperfusion. Pt has received IVF and BP has improved as has lactic. But she is still hypotensive.  We will get echo with bubble / cta  chest and MRI brain .  vancomycin 1,750 mg (07/16/23 2055)  No intake or output data in  the 24 hours ending 07/16/23 2159   Orthostatic hypotension Vitals:   07/16/23 1821 07/16/23 2130  BP: 129/76 106/60  Hold metoprolol and NTG.    Aspiration pneumonia of both lungs (HCC) We will obtain CTA chest  to identify PE. Procalcitonin pending's. Will consider pulmonary consult.    Acute on chronic respiratory failure with hypoxia (HCC) SpO2: 99 % On 6 L [place].  Anemia Patient's hemoglobin today is 8.6 which is lower than previous at 9.8.  Patient has been chronically low intermittently since 2017.  Patient's microcytic pattern elevated RDW, thrombocytosis likely from iron deficiency anemia. Currently pt is on Plavix and she used to be on DAPT.  Chronic diastolic CHF (congestive heart failure) (HCC) Strict I/O Daily weight.    Hemiplegia and hemiparesis following cerebral infarction affecting left non-dominant side (HCC) Left shoulder xray malalignment.    GERD (gastroesophageal reflux disease) PPI therapy.   Type 2 diabetes mellitus with other specified complication (HCC) Glycemic protocol.   Infestation by bed bug Contact Isolation.     DVT prophylaxis:  Heparin.  Consults:  None.  Advance Care Planning:    Code Status: Prior   Family Communication:  Spouse at bedside.   Disposition Plan:  Back to previous home environment  Severity of Illness: The appropriate patient status for this patient is INPATIENT. Inpatient status is judged to be reasonable and necessary in order to provide the required intensity of service to ensure the patient's safety. The patient's presenting symptoms, physical exam findings, and initial radiographic and laboratory data in the context of their chronic comorbidities is felt to place them at high risk for further clinical deterioration. Furthermore, it is not anticipated that the patient will be medically stable for discharge from the hospital within 2 midnights of admission.   * I certify that at the point of admission it  is my clinical judgment that the patient will require inpatient hospital care spanning beyond 2 midnights from the point of admission due to high intensity of service, high risk for further deterioration and high frequency of surveillance required.*  Author: Gertha Calkin, MD 07/16/2023 10:19 PM  For on call review [email or web address].

## 2023-07-16 NOTE — Assessment & Plan Note (Addendum)
Vitals:   07/16/23 1821 07/16/23 2130  BP: 129/76 106/60  Hold metoprolol and NTG.

## 2023-07-16 NOTE — Assessment & Plan Note (Signed)
We will obtain CTA chest  to identify PE. Procalcitonin pending's. Will consider pulmonary consult.

## 2023-07-16 NOTE — Telephone Encounter (Signed)
Based on pt's symptoms, nurse feels pt should be evaluated by cardiology for further evaluations.  Per Dr. Kirke Corin, ok to schedule pt for today 7/26 at 3:20 pm. Pt's husband made aware and verbalized understanding.

## 2023-07-16 NOTE — Progress Notes (Signed)
CODE SEPSIS - PHARMACY COMMUNICATION  **Broad Spectrum Antibiotics should be administered within 1 hour of Sepsis diagnosis**  Time Code Sepsis Called/Page Received: 1903  Antibiotics Ordered: Vancomycin, Cefepime  Time of 1st antibiotic administration: 1956  Additional action taken by pharmacy: Secure chat   If necessary, Name of Provider/Nurse Contacted: RN    Netta Neat ,PharmD Clinical Pharmacist  07/16/2023  7:09 PM

## 2023-07-16 NOTE — Assessment & Plan Note (Addendum)
Patient's hemoglobin today is 8.6 which is lower than previous at 9.8.  Patient has been chronically low intermittently since 2017.  Patient's microcytic pattern elevated RDW, thrombocytosis likely from iron deficiency anemia. Currently pt is on Plavix and she used to be on DAPT.

## 2023-07-16 NOTE — ED Provider Notes (Signed)
[facility] Provider Note    Event Date/Time   First MD Initiated Contact with Patient 07/16/23 1817     (approximate)   History   Shortness of Breath   HPI  Erika [name] is a 52 y.o. female with history of chronic respiratory failure on 6 L nasal cannula CAD s/p CABG, CVA with left-sided deficits, HTN, T2DM, CHF presenting to the emergency department for evaluation for evaluation of weakness.  I reviewed her discharge summary from 07/05/2023.  At that time she presented with acute on chronic respiratory failure with hypoxia suspected to be due to recurrent aspiration.  She was treated with IV Unasyn and transition to p.o. Augmentin.  She had orthostatic hypotension possibly due to autonomic dysfunction.    Patient reports that since discharge she has continued to had orthostasis with syncopal episodes.  She additionally reports hypoxic episodes on her home 6 L with difficulty keeping her sats above 90%.  She saw cardiology earlier today and had medication adjustments.  Had an x-Tisha Cline done there that demonstrated concern for worsening bilateral pneumonia.  She spoke with her primary care doctor who contacted pulmonology.  It was felt that patient to the ER with anticipated admission for IV antibiotics and consideration of bronchoscopy. should      Physical Exam   Triage Vital Signs: ED Triage Vitals  Encounter Vitals Group     BP 07/16/23 1821 129/76     Systolic BP Percentile --      Diastolic BP Percentile --      Pulse Rate 07/16/23 1821 (!) 102     Resp 07/16/23 1821 18     Temp 07/16/23 1821 99.7 F (37.6 C)     Temp Source 07/16/23 1821 Oral     SpO2 07/16/23 1821 96 %     Weight 07/16/23 1822 181 lb (82.1 kg)     Height 07/16/23 1822 5\' 6"  (1.676 m)     Head Circumference --      Peak Flow --      Pain Score --      Pain Loc --      Pain Education --      Exclude from Growth Chart --     Most recent vital signs: Vitals:   07/16/23  1825 07/16/23 2130  BP:  106/60  Pulse:  91  Resp:  18  Temp:    SpO2: 94% 99%     General: Awake, interactive  CV:  Mild tachycardia with regular rhythm, normal peripheral perfusion Resp:  Lung sounds mildly coarse at bilateral bases, respirations mildly labored, on 6 L satting in the mid 90s Abd:  Soft, nondistended.  Neuro:  Symmetric facial movement, fluid speech   ED Results / Procedures / Treatments   Labs (all labs ordered are listed, but only abnormal results are displayed) Labs Reviewed  LACTIC ACID, PLASMA - Abnormal; Notable for the following components:      Result Value   Lactic Acid, Venous 3.0 (*)    All other components within normal limits  COMPREHENSIVE METABOLIC PANEL - Abnormal; Notable for the following components:   Sodium 131 (*)    Chloride 97 (*)    Glucose, Bld 271 (*)    Albumin 3.3 (*)    All other components within normal limits  CBC WITH DIFFERENTIAL/PLATELET - Abnormal; Notable for the following components:   WBC 24.2 (*)    Hemoglobin 8.6 (*)    HCT 28.1 (*)  MCV 68.9 (*)    MCH 21.1 (*)    RDW 18.1 (*)    Platelets 575 (*)    Neutro Abs 21.8 (*)    Abs Immature Granulocytes 0.09 (*)    All other components within normal limits  BLOOD GAS, VENOUS - Abnormal; Notable for the following components:   pO2, Ven 53 (*)    Bicarbonate 30.5 (*)    Acid-Base Excess 5.1 (*)    All other components within normal limits  D-DIMER, QUANTITATIVE - Abnormal; Notable for the following components:   D-Dimer, Quant 1.24 (*)    All other components within normal limits  RESP PANEL BY RT-PCR (RSV, FLU A&B, COVID)  RVPGX2  CULTURE, BLOOD (ROUTINE X 2)  CULTURE, BLOOD (ROUTINE X 2)  MRSA NEXT GEN BY PCR, NASAL  LACTIC ACID, PLASMA  BRAIN NATRIURETIC PEPTIDE  PROCALCITONIN  LAMOTRIGINE LEVEL  HIV ANTIBODY (ROUTINE TESTING W REFLEX)  COMPREHENSIVE METABOLIC PANEL  CBC  POC URINE PREG, ED  TROPONIN I (HIGH SENSITIVITY)  TROPONIN I (HIGH  SENSITIVITY)     EKG EKG independently reviewed interpreted by myself (ER attending) demonstrates:    RADIOLOGY Imaging independently reviewed and interpreted by myself demonstrates:  I did review patient's outpatient x-Daniya Aramburo.  This does demonstrate patchy infiltrates in the bilateral lower lung fields, radiology notes that there is interval worsening of the infiltrates in the left lung  PROCEDURES:  Critical Care performed: Yes, see critical care procedure note(s)  CRITICAL CARE Performed by: Trinna Post   Total critical care time: 34 minutes  Critical care time was exclusive of separately billable procedures and treating other patients.  Critical care was necessary to treat or prevent imminent or life-threatening deterioration.  Critical care was time spent personally by me on the following activities: development of treatment plan with patient and/or surrogate as well as nursing, discussions with consultants, evaluation of patient's response to treatment, examination of patient, obtaining history from patient or surrogate, ordering and performing treatments and interventions, ordering and review of laboratory studies, ordering and review of radiographic studies, pulse oximetry and re-evaluation of patient's condition.   Procedures   MEDICATIONS ORDERED IN ED: Medications  acetaminophen (TYLENOL) tablet 325-650 mg (has no administration in time range)  albuterol (PROVENTIL) (2.5 MG/3ML) 0.083% nebulizer solution 2.5 mg (has no administration in time range)  chlorproMAZINE (THORAZINE) tablet 75 mg (has no administration in time range)  clopidogrel (PLAVIX) tablet 75 mg (has no administration in time range)  escitalopram (LEXAPRO) tablet 20 mg (has no administration in time range)  fludrocortisone (FLORINEF) tablet 0.1 mg (has no administration in time range)  lamoTRIgine (LAMICTAL) tablet 200 mg (has no administration in time range)  lamoTRIgine (LAMICTAL) tablet 25 mg (has no  administration in time range)  midodrine (PROAMATINE) tablet 5 mg (has no administration in time range)  pregabalin (LYRICA) capsule 225 mg (has no administration in time range)  arformoterol (BROVANA) nebulizer solution 15 mcg (has no administration in time range)    And  umeclidinium bromide (INCRUSE ELLIPTA) 62.5 MCG/ACT 1 puff (has no administration in time range)  topiramate (TOPAMAX) tablet 25 mg (has no administration in time range)  heparin injection 5,000 Units (has no administration in time range)  cefTRIAXone (ROCEPHIN) 2 g in sodium chloride 0.9 % 100 mL IVPB (has no administration in time range)  azithromycin (ZITHROMAX) 500 mg in sodium chloride 0.9 % 250 mL IVPB (has no administration in time range)  vancomycin (VANCOREADY) IVPB 1750 mg/350 mL (has no administration  in time range)  ceFEPIme (MAXIPIME) 2 g in sodium chloride 0.9 % 100 mL IVPB (0 g Intravenous Stopped 07/16/23 2040)  vancomycin (VANCOREADY) IVPB 1750 mg/350 mL (0 mg Intravenous Stopped 07/16/23 2237)  sodium chloride 0.9 % bolus 1,000 mL (0 mLs Intravenous Stopped 07/16/23 2237)  iohexol (OMNIPAQUE) 350 MG/ML injection 75 mL (75 mLs Intravenous Contrast Given 07/16/23 2244)     IMPRESSION / MDM / ASSESSMENT AND PLAN / ED COURSE  I reviewed the triage vital signs and the nursing notes.  Differential diagnosis includes, but is not limited to, pneumonia with questionable sepsis, worsening underlying lung disease, aspiration  Patient's presentation is most consistent with acute presentation with potential threat to life or bodily function.  52 year old female with recent admission for acute on chronic respiratory failure presenting with outpatient x-Chaise Mahabir demonstrating worsening infiltrates in bilateral lungs.  She is on 6 L satting appropriately here, but does report episodes of hypoxia at home.  She has mild tachycardia here, but in her cardiology office earlier today was documented with a pulse of 124.  Sepsis orders  initiated here with empiric cefepime and vancomycin.  Her lab work demonstrate significant leukocytosis WC of 24.2.  Stable anemia at 8.6.  Lactate elevated at 3.  Does have a history of CHF, but will order 1 L IV fluids to start.  Do think she is appropriate for admission for further evaluation.  Will reach out to hospitalist team.  Case reviewed with Dr. Allena Katz.  She will evaluate the patient for anticipated admission.     FINAL CLINICAL IMPRESSION(S) / ED DIAGNOSES   Final diagnoses:  Pneumonia of both lungs due to infectious organism, unspecified part of lung  Lactic acidosis     Rx / DC Orders   ED Discharge Orders     None        Note:  This document was prepared using Dragon voice recognition software and may include unintentional dictation errors.   Trinna Post, MD 07/17/23 [phone]

## 2023-07-16 NOTE — Progress Notes (Signed)
PHARMACY -  BRIEF ANTIBIOTIC NOTE   Pharmacy has received consult(s) for Vancomycin and Cefepime from an ED provider.  The patient's profile has been reviewed for ht/wt/allergies/indication/available labs.    One time orders placed for Vancomycin 1750 mg IV and Cefepime 2 g IV.   Further antibiotics/pharmacy consults should be ordered by admitting physician if indicated.                       Thank you, Netta Neat 07/16/2023  7:14 PM

## 2023-07-16 NOTE — ED Triage Notes (Addendum)
Pt coming from PCP who stated she has PNA and requested her to come to ED. PMH: CVA with L sided deficits, 6L [place] baseline COPD. VSS. Bed bugs found on Pt. Pt reports passing out at home witnessed by husband , Pt reports hitting head a week ago.

## 2023-07-16 NOTE — Telephone Encounter (Signed)
Received below message form Buelah Manis, NP via epic secure chat  Tell her to call cardiology, if she can't get in today then needs to go back to ED

## 2023-07-16 NOTE — Progress Notes (Signed)
Pharmacy Antibiotic Note  Erika [name] is a 52 y.o. female admitted on 07/16/2023 with pneumonia.  Pharmacy has been consulted for Vancomycin dosing.  Plan: Vancomycin 1750 mg IV Q 24 hrs. Goal AUC 400-550. Expected AUC: 483.3 SCr used: 0.93  Pharmacy will continue to follow and will adjust abx dosing whenever warranted.  Temp (24hrs), Avg:99.7 F (37.6 C), Min:99.7 F (37.6 C), Max:99.7 F (37.6 C)   Recent Labs  Lab 07/12/23 1220 07/16/23 1836 07/16/23 2058  WBC 12.1* 24.2*  --   CREATININE 0.92 0.93  --   LATICACIDVEN  --  3.0* 1.7    Estimated Creatinine Clearance: 79 mL/min (by C-G formula based on SCr of 0.93 mg/dL).    No Known Allergies  Antimicrobials this admission: 7/23 Cefepime >> x 1 dose 7/23 Vancomycin >>  7/23 Azithromycin >> x 5 days 7/24 Ceftriaxone >> x 5 days  Microbiology results: 7/23 BCx: Pending  Thank you for allowing pharmacy to be a part of this patient's care.  Otelia Sergeant, PharmD, [facility] 07/16/2023 10:55 PM

## 2023-07-16 NOTE — Assessment & Plan Note (Signed)
Glycemic protocol.   

## 2023-07-16 NOTE — Progress Notes (Signed)
Cardiology Office Note   Date:  07/16/2023   ID:  Erika, [name] 1971-03-06, MRN [id number]  PCP:  Eden Emms, NP Cardiologist:   Lorine Bears, MD   Chief Complaint  Patient presents with   Concerns of hypotension and tachycardia    Patient reports syncope episodes with last one this morning.  Recently admitted for pneumonia with discharge on 07/05/23.  Was having symptoms of hypotension, tachycardia, dizzy during admission.  Denies chest pain.  Orthostatic blood pressure obtained.  Unable to obtain orthostatic bp standing after 3 minutes due to dizziness.  Patient became dizzy upon sitting and dizziness usually last 10-15 minutes.        History of Present Illness: Erika [name] is a 52 y.o. female who presents for a follow-up visit  regarding extensive cardiovascular history. She has known history of coronary artery disease with previous non-ST elevation myocardial infarction in 2017.  She was found to have three-vessel coronary artery disease at that time and underwent CABG and right carotid endarterectomy. She was rehospitalized in September, 2017 with chest pain in the setting of uncontrolled hypertension. Cardiac catheterization showed patent grafts . The LAD had diffuse disease distal to the anastomosis and was very small in caliber. Ejection fraction was normal with mildly elevated left ventricular end-diastolic pressure.    She has history of difficult to control hypertension with labile blood pressure.  No evidence of renal artery stenosis on previous angiography.  She has known history of severe mixed hyperlipidemia .   She is known to have peripheral arterial disease with previous stenting of the left SFA. She was subsequently found to have occluded left SFA.  She subsequently had right leg claudication and was found to have an occluded right SFA.    She underwent repeat cardiac catheterization in September 2021 due to worsening angina.  Cardiac cath  showed progression of native coronary artery disease but patent grafts with no obstructive disease.  Medical therapy was recommended.    She was hospitalized in July of 2022 with acute stroke.  She was found to have mildly elevated troponin.  Her symptoms included facial droop and left-sided weakness.  Her stroke was treated medically.  Elevated troponin was felt to be due to demand ischemia.  Echocardiogram was done which showed normal LV systolic function with no significant valvular abnormalities.  Hospitalization was complicated by aspiration pneumonia.   She had an echocardiogram done in February of 2023 which showed normal LV systolic function with mild mitral regurgitation.  Cardiac catheterization done in February 2023  showed significant underlying three-vessel coronary artery disease with patent grafts including LIMA to LAD, SVG to diagonal/OM and SVG to right PDA.  There was mild to moderate distal LAD disease post LIMA anastomosis.  She was hospitalized in June with sepsis and acute kidney injury.  She was treated with antibiotics. She was recently hospitalized due to syncope.  She was diagnosed with pneumonia and was treated with antibiotics.  She had orthostatic hypotension and thus was started on midodrine.  Metoprolol was stopped.  She was discharged home on July 12.  Since discharge, she continues to be severely orthostatic with syncopal episodes.  Her heart rate is frequently above 100 and she feels palpitations.  No chest pain but does complain of exertional dyspnea and fatigue.  She has no energy.   Past Medical History:  Diagnosis Date   Acute respiratory failure with hypoxia (HCC) 06/24/2021   Arthralgia of temporomandibular joint  CAD, multiple vessel    a. 06/2016 Cath: ostLM 40%, ostLAD 40%, pLAD 95%, ost-pLCx 60%, pLCx 95%, mLCx 60%, mRCA 95%, D2 50%, LVSF nl;  b. 07/2016 CABG x 4 (LIMA->LAD, VG->Diag, VG->OM, VG->RCA); c. 08/2016 Cath: 3VD w/ 4/4 patent grafts. LAD distal  to LIMA has diff dzs->Med rx; d. 08/2020 Cath: 4/4 patent grafts, native 3VD. EF 55-65%-->Med Rx.   Carotid arterial disease (HCC)    a. 07/2016 s/p R CEA; b. 02/2021 U/S: RICA 40-59%, LICA 1-39%.   Cerebrovascular disease    Clotting disorder (HCC)    Complex sleep apnea syndrome 10/03/2022   Depression    Diastolic dysfunction    a. 06/2016 Echo: EF 50-55%, mild inf wall HK, GR1DD, mild MR, RV sys fxn nl, mildly dilated LA, PASP nl; b. 06/2021 Echo: EF 60-65%, no rwma. Nl RV fxn.   Fatty liver disease, nonalcoholic 2016   History of blood transfusion    with heart surgery   HLD (hyperlipidemia)    Labile hypertension    a. prior renal ngiogram negative for RAS in 03/2016; b. catecholamines and metanephrines normal, mildly elevated renin with normal aldosterone and normal ratio in 02/2016   Myocardial infarction [facility]) 2017   Obesity    PAD (peripheral artery disease) (HCC)    a. 09/2018 s/p L SFA stenting; b. 07/2019 Periph Angio: Patent m/d L SFA stent w/ 100% L SFA distal to stent. L AT 100d, L Peroneal diff dzs-->Med Rx; c. 02/2021 ABIs: stable @ 0.61 on R and 0.46 on L.   PTSD (post-traumatic stress disorder)    Stroke (HCC)    Tobacco abuse    Type 2 diabetes mellitus (HCC) 12/2015    Past Surgical History:  Procedure Laterality Date   ABDOMINAL AORTOGRAM W/LOWER EXTREMITY [name] 10/15/2018   Procedure: ABDOMINAL AORTOGRAM W/LOWER EXTREMITY;  Surgeon: Iran Ouch, MD;  Location: [facility];  Service: Cardiovascular;  Laterality: [name];   ABDOMINAL AORTOGRAM W/LOWER EXTREMITY Bilateral 08/19/2019   Procedure: ABDOMINAL AORTOGRAM W/LOWER EXTREMITY;  Surgeon: Iran Ouch, MD;  Location: [facility];  Service: Cardiovascular;  Laterality: Bilateral;   CARDIAC CATHETERIZATION [name] 06/29/2016   Procedure: Left Heart Cath and Coronary Angiography;  Surgeon: Antonieta Iba, MD;  Location: [facility];  Service: Cardiovascular;  Laterality: [name];   CARDIAC  CATHETERIZATION [name] 08/29/2016   Procedure: Left Heart Cath and Cors/Grafts Angiography;  Surgeon: Iran Ouch, MD;  Location: [facility];  Service: Cardiovascular;  Laterality: [name];   CESAREAN SECTION     CHOLECYSTECTOMY     CORONARY ARTERY BYPASS GRAFT [name] 07/06/2016   Procedure: CORONARY ARTERY BYPASS GRAFTING (CABG) x four, using left internal mammary artery and right leg greater saphenous vein harvested endoscopically;  Surgeon: Kerin Perna, MD;  Location: [facility] OR;  Service: Open Heart Surgery;  Laterality: [name];   ENDARTERECTOMY Right 07/06/2016   Procedure: ENDARTERECTOMY CAROTID;  Surgeon: Larina Earthly, MD;  Location: [facility] OR;  Service: Vascular;  Laterality: Right;   ENDARTERECTOMY Right 04/27/2020   Procedure: REDO OF RIGHT ENDARTERECTOMY CAROTID;  Surgeon: Larina Earthly, MD;  Location: [facility] OR;  Service: Vascular;  Laterality: Right;   LEFT HEART CATH AND CORS/GRAFTS ANGIOGRAPHY [name] 08/24/2020   Procedure: LEFT HEART CATH AND CORS/GRAFTS ANGIOGRAPHY;  Surgeon: Iran Ouch, MD;  Location: [facility];  Service: Cardiovascular;  Laterality: [name];   LEFT HEART CATH AND CORS/GRAFTS ANGIOGRAPHY [name] 01/29/2022   Procedure: LEFT HEART CATH  AND CORS/GRAFTS ANGIOGRAPHY;  Surgeon: Iran Ouch, MD;  Location: [facility];  Service: Cardiovascular;  Laterality: [name];   PERIPHERAL VASCULAR CATHETERIZATION [name] 04/18/2016   Procedure: Renal Angiography;  Surgeon: Iran Ouch, MD;  Location: [facility];  Service: Cardiovascular;  Laterality: [name];   PERIPHERAL VASCULAR INTERVENTION Left 10/15/2018   Procedure: PERIPHERAL VASCULAR INTERVENTION;  Surgeon: Iran Ouch, MD;  Location: [facility];  Service: Cardiovascular;  Laterality: Left;  Left superficial femoral   TEE WITHOUT CARDIOVERSION [name] 07/06/2016   Procedure: TRANSESOPHAGEAL ECHOCARDIOGRAM (TEE);  Surgeon: Kerin Perna, MD;  Location[facility];  Service: Open Heart Surgery;  Laterality: [name];    TONSILLECTOMY       Current Outpatient Medications  Medication Sig Dispense Refill   acetaminophen (TYLENOL) 325 MG tablet Take 1-2 tablets (325-650 mg total) by mouth every 4 (four) hours as needed for mild pain.     albuterol (PROVENTIL) (2.5 MG/3ML) 0.083% nebulizer solution Take 3 mLs (2.5 mg total) by nebulization every 4 (four) hours as needed for wheezing or shortness of breath. 75 mL 2   chlorproMAZINE (THORAZINE) 25 MG tablet Take 3 tablets (75 mg total) by mouth at bedtime. 90 tablet 2   clonazePAM (KLONOPIN) 0.5 MG tablet Take 0.5 tablets (0.25 mg total) by mouth 2 (two) times daily. Take 1/2 tab twice daily. No dissolving tablets. 30 tablet 2   clopidogrel (PLAVIX) 75 MG tablet Take 1 tablet (75 mg total) by mouth daily. 90 tablet 3   Continuous Glucose Sensor (DEXCOM G7 SENSOR) MISC APPLY ONE SENSOR TO THE BACK OF YOUR UPPER ARM. REPLACE EVERY 10 DAYS. 3 each 5   cyclobenzaprine (FLEXERIL) 10 MG tablet TAKE ONE TABLET BY MOUTH AT BEDTIME 30 tablet 3   escitalopram (LEXAPRO) 20 MG tablet Take 1 tablet (20 mg total) by mouth daily. 30 tablet 2   Evolocumab (REPATHA SURECLICK) 140 MG/ML SOAJ Inject 140 mg into the skin as directed. Inject 1 Dose into the skin every 14 (fourteen) days. 6 mL 3   ferrous sulfate 324 (65 Fe) MG TBEC Take 1 tablet (324 mg total) by mouth 2 (two) times daily. 60 tablet 2   fludrocortisone (FLORINEF) 0.1 MG tablet Take 1 tablet (0.1 mg total) by mouth daily. 90 tablet 1   icosapent Ethyl (VASCEPA) 1 g capsule Take 2 g by mouth 2 (two) times daily.     lamoTRIgine (LAMICTAL) 200 MG tablet Take 1 tablet (200 mg total) by mouth at bedtime. 30 tablet 2   lamoTRIgine (LAMICTAL) 25 MG tablet Take 1 tablet (25 mg total) by mouth daily. 30 tablet 2   metoprolol succinate (TOPROL-XL) 25 MG 24 hr tablet Take 1 tablet (25 mg total) by mouth daily. 90 tablet 1   midodrine (PROAMATINE) 5 MG tablet Take 1 tablet (5 mg total) by mouth 3 (three) times daily with meals. 90  tablet 1   MOUNJARO 7.5 MG/0.5ML Pen INJECT THE CONTENTS OF ONE PEN UNDER THE SKIN WEEKLY ON THE SAME DAY EACH WEEK 6 mL 0   nitroGLYCERIN (NITROSTAT) 0.4 MG SL tablet Place 1 tablet (0.4 mg total) under the tongue every 5 (five) minutes as needed for chest pain. 25 tablet 1   Oxycodone HCl 10 MG TABS Take 1 tablet (10 mg total) by mouth 2 (two) times daily as needed. 60 tablet 0   pantoprazole (PROTONIX) 40 MG tablet TAKE ONE TABLET BY MOUTH ONE TIME DAILY 30 tablet 0   pregabalin (  LYRICA) 225 MG capsule Take 1 capsule (225 mg total) by mouth 2 (two) times daily. 60 capsule 3   Tiotropium Bromide-Olodaterol (STIOLTO RESPIMAT) 2.5-2.5 MCG/ACT AERS Inhale 2 puffs into the lungs daily. 4 g 0   topiramate (TOPAMAX) 25 MG tablet Take 1 tablet (25 mg total) by mouth at bedtime. 90 tablet 3   ULTICARE MINI PEN NEEDLES 31G X 6 MM MISC USE PEN NEEDLES TWO TIMES DAILY 100 each 3   No current facility-administered medications for this visit.    Allergies:   Patient has no active allergies.    Social History:  The patient  reports that she quit smoking about 2 years ago. Her smoking use included cigarettes. She started smoking about 32 years ago. She has a 15 pack-year smoking history. She has never been exposed to tobacco smoke. She has never used smokeless tobacco. She reports that she does not currently use alcohol. She reports that she does not use drugs.   Family History:  The patient's family history includes Alcohol abuse in her father; Anxiety disorder in her sister; Diabetes in her father and mother; Drug abuse in her father; Heart disease in her father; Stroke in her sister. She was adopted.    ROS:  Please see the history of present illness.   Otherwise, review of systems are positive for none.   All other systems are reviewed and negative.    PHYSICAL EXAM: VS:  BP 117/79 (BP Location: Right Arm, Patient Position: Sitting, Cuff Size: Normal)   Pulse (!) 124   Ht 5\' 6"  (1.676 m)   Wt 191  lb 9.6 oz (86.9 kg)   SpO2 (!) 85%   BMI 30.93 kg/m  , BMI Body mass index is 30.93 kg/m. GEN: Well nourished, well developed, in no acute distress  HEENT: normal  Neck: no JVD, carotid bruits, or masses Cardiac: RRR; no  rubs, or gallops,no edema .  No murmurs. Respiratory:  clear to auscultation bilaterally, normal work of breathing GI: soft, nontender, nondistended, + BS MS: no deformity or atrophy  Skin: warm and dry, no rash Neuro:  Strength and sensation are intact Psych: euthymic mood, full affect   EKG:  EKG is ordered today. EKG showed : Sinus tachycardia Septal infarct , age undetermined ST & T wave abnormality, consider inferior ischemia     Recent Labs: 07/03/2023: B Natriuretic Peptide 45.7 07/12/2023: ALT 8; BUN 10; Creat 0.92; Hemoglobin 9.8; Platelets 554; Potassium 3.5; Sodium 135    Lipid Panel    Component Value Date/Time   CHOL 93 05/25/2022 1032   CHOL 121 08/21/2016 0817   TRIG 182.0 (H) 05/25/2022 1032   HDL 41.80 05/25/2022 1032   HDL 24 (L) 08/21/2016 0817   CHOLHDL 2 05/25/2022 1032   VLDL 36.4 05/25/2022 1032   LDLCALC 15 05/25/2022 1032   LDLCALC 64 08/21/2016 0817   LDLDIRECT 99.6 (H) 06/25/2021 0740      Wt Readings from Last 3 Encounters:  07/16/23 191 lb 9.6 oz (86.9 kg)  07/12/23 190 lb (86.2 kg)  07/03/23 191 lb 12.8 oz (87 kg)         ASSESSMENT AND PLAN:  1.  Coronary artery disease involving native coronary arteries with stable angina: Status post CABG in July of 2017.  Most recent cardiac catheterization in 2023 showed patent grafts with no obstructive disease.  Continue medical therapy.   Given recent iron deficiency anemia, I discontinued aspirin.  Continue clopidogrel.  2. Peripheral arterial disease : She  is known to have bilateral SFA occlusion but currently with minimal claudication given that she is mostly using her wheelchair due to severe orthostatic dizziness.   3. Carotid artery disease status post right  carotid endarterectomy.  Status post right carotid endarterectomy twice on the right.  Most recent carotid Doppler in June showed mild nonobstructive disease.     4. Essential hypertension: Blood pressure has been well-controlled but is mildly elevated today.  It was normal yesterday.  Continue Toprol.  I made no changes.  5. Hyperlipidemia: She is currently on Repatha, Rosuvastatin and Vascepa.  Most recent lipid profile showed an LDL of 15.    6.  Severe orthostatic dizziness and hypotension.  Her blood pressure dropped from 112-61 from lying to standing position and heart rate increased from 123 to 137 bpm.  This is in spite of being on midodrine.  Not entirely sure what is causing this given that she has been severely hypertensive throughout her life but I suspect autonomic dysfunction.  I elected to add I elected to add Florinef 0.1 mg once daily.  In addition, she was feeling better when she was on Toprol.  I added Toprol at a lower dose of 25 mg once daily. Given tachycardia and shortness of breath, I requested an echocardiogram.  It might be worth checking her TSH with her next labs.  7.  Iron deficiency anemia: This is likely contributing to her dizziness and fatigue.  I started her on ferrous sulfate twice daily.  If no improvement, consider referral to the cancer center for iron infusion.  I discontinued aspirin and kept her on clopidogrel.    Disposition:   FU with me in 1 month   Signed,  Lorine Bears, MD  07/16/2023 3:57 PM    [facility]

## 2023-07-16 NOTE — Patient Instructions (Addendum)
Medication Instructions:  STOP the Aspirin  START Florinef 0.1 mg once daily START Metoprolol (Toprol) 25 mg once daily START Ferrous Sulfate one tablet twice daily  *If you need a refill on your cardiac medications before your next appointment, please call your pharmacy*   Lab Work: None ordered If you have labs (blood work) drawn today and your tests are completely normal, you will receive your results only by: MyChart Message (if you have MyChart) OR A paper copy in the mail If you have any lab test that is abnormal or we need to change your treatment, we will call you to review the results.   Testing/Procedures: Your physician has requested that you have an echocardiogram. Echocardiography is a painless test that uses sound waves to create images of your heart. It provides your doctor with information about the size and shape of your heart and how well your heart's chambers and valves are working.   You may receive an ultrasound enhancing agent through an IV if needed to better visualize your heart during the echo. This procedure takes approximately one hour.  There are no restrictions for this procedure.  This will take place at [address] [address], [place] [address]    Follow-Up: At [facility], you and your health needs are our priority.  As part of our continuing mission to provide you with exceptional heart care, we have created designated Provider Care Teams.  These Care Teams include your primary Cardiologist (physician) and Advanced Practice Providers (APPs -  Physician Assistants and Nurse Practitioners) who all work together to provide you with the care you need, when you need it.  We recommend signing up for the patient portal called "MyChart".  Sign up information is provided on this After Visit Summary.  MyChart is used to connect with patients for Virtual Visits (Telemedicine).  Patients are able to view lab/test results,  encounter notes, upcoming appointments, etc.  Non-urgent messages can be sent to your provider as well.   To learn more about what you can do with MyChart, go to [email or web address].    Your next appointment:   1 month(s)  Provider:   You may see Lorine Bears, MD or one of the following Advanced Practice Providers on your designated Care Team:   Nicolasa Ducking, NP Eula Listen, PA-C Cadence Fransico Michael, PA-C Charlsie Quest, NP

## 2023-07-16 NOTE — Assessment & Plan Note (Signed)
PPI therapy. 

## 2023-07-17 ENCOUNTER — Inpatient Hospital Stay (HOSPITAL_COMMUNITY)
Admit: 2023-07-17 | Discharge: 2023-07-17 | Disposition: A | Discharge status: Home / Self Care | Payer: BC Managed Care – PPO | Attending: Internal Medicine | Admitting: Internal Medicine

## 2023-07-17 DIAGNOSIS — J9611 Chronic respiratory failure with hypoxia: Secondary | ICD-10-CM | POA: Insufficient documentation

## 2023-07-17 DIAGNOSIS — R55 Syncope and collapse: Secondary | ICD-10-CM | POA: Diagnosis not present

## 2023-07-17 DIAGNOSIS — D509 Iron deficiency anemia, unspecified: Secondary | ICD-10-CM | POA: Insufficient documentation

## 2023-07-17 DIAGNOSIS — A419 Sepsis, unspecified organism: Secondary | ICD-10-CM

## 2023-07-17 DIAGNOSIS — J690 Pneumonitis due to inhalation of food and vomit: Secondary | ICD-10-CM | POA: Diagnosis not present

## 2023-07-17 DIAGNOSIS — Z515 Encounter for palliative care: Secondary | ICD-10-CM

## 2023-07-17 DIAGNOSIS — Z7189 Other specified counseling: Secondary | ICD-10-CM | POA: Diagnosis not present

## 2023-07-17 DIAGNOSIS — I9589 Other hypotension: Secondary | ICD-10-CM | POA: Insufficient documentation

## 2023-07-17 DIAGNOSIS — E876 Hypokalemia: Secondary | ICD-10-CM | POA: Insufficient documentation

## 2023-07-17 DIAGNOSIS — R6520 Severe sepsis without septic shock: Secondary | ICD-10-CM

## 2023-07-17 LAB — ECHOCARDIOGRAM COMPLETE BUBBLE STUDY
AR max vel: 2.7 cm2
AV Area VTI: 2.19 cm2
AV Area mean vel: 2.62 cm2
AV Mean grad: 5 mmHg
AV Peak grad: 8.1 mmHg
Ao pk vel: 1.42 m/s
Area-P 1/2: 4.17 cm2
MV VTI: 2.95 cm2
S' Lateral: 3 cm

## 2023-07-17 LAB — COMPREHENSIVE METABOLIC PANEL
ALT: 10 U/L (ref 0–44)
AST: 13 U/L — ABNORMAL LOW (ref 15–41)
Albumin: 2.9 g/dL — ABNORMAL LOW (ref 3.5–5.0)
Alkaline Phosphatase: 45 U/L (ref 38–126)
Anion gap: 5 (ref 5–15)
BUN: 9 mg/dL (ref 6–20)
CO2: 26 mmol/L (ref 22–32)
Calcium: 8.7 mg/dL — ABNORMAL LOW (ref 8.9–10.3)
Chloride: 104 mmol/L (ref 98–111)
Creatinine, Ser: 0.71 mg/dL (ref 0.44–1.00)
GFR, Estimated: >60 mL/min (ref >60)
Glucose, Bld: 120 mg/dL — ABNORMAL HIGH (ref 70–99)
Potassium: 3.4 mmol/L — ABNORMAL LOW (ref 3.5–5.1)
Sodium: 135 mmol/L (ref 135–145)
Total Bilirubin: 0.4 mg/dL (ref 0.3–1.2)
Total Protein: 6.5 g/dL (ref 6.5–8.1)

## 2023-07-17 LAB — CBC
HCT: 26.4 % — ABNORMAL LOW (ref 36.0–46.0)
Hemoglobin: 7.8 g/dL — ABNORMAL LOW (ref 12.0–15.0)
MCH: 21 pg — ABNORMAL LOW (ref 26.0–34.0)
MCHC: 29.5 g/dL — ABNORMAL LOW (ref 30.0–36.0)
MCV: 71.2 fL — ABNORMAL LOW (ref 80.0–100.0)
Platelets: 508 10*3/uL — ABNORMAL HIGH (ref 150–400)
RBC: 3.71 MIL/uL — ABNORMAL LOW (ref 3.87–5.11)
RDW: 18.3 % — ABNORMAL HIGH (ref 11.5–15.5)
WBC: 22.8 10*3/uL — ABNORMAL HIGH (ref 4.0–10.5)
nRBC: 0 % (ref 0.0–0.2)

## 2023-07-17 LAB — HIV ANTIBODY (ROUTINE TESTING W REFLEX): HIV Screen 4th Generation wRfx: NONREACTIVE

## 2023-07-17 LAB — CULTURE, BLOOD (ROUTINE X 2)
Culture: NO GROWTH
Special Requests: ADEQUATE
Special Requests: ADEQUATE

## 2023-07-17 LAB — TROPONIN I (HIGH SENSITIVITY): Troponin I (High Sensitivity): 9 ng/L (ref <18)

## 2023-07-17 LAB — MRSA NEXT GEN BY PCR, NASAL: MRSA by PCR Next Gen: NOT DETECTED

## 2023-07-17 MED ORDER — TOPIRAMATE 25 MG PO TABS
25.0000 mg | ORAL_TABLET | Freq: Every day | ORAL | Status: DC
  Administered 2023-07-17 – 2023-07-18 (×3): 25 mg via ORAL
  Filled 2023-07-17 (×3): qty 1

## 2023-07-17 MED ORDER — OXYCODONE HCL 5 MG PO TABS
10.0000 mg | ORAL_TABLET | Freq: Two times a day (BID) | ORAL | Status: DC | PRN
  Administered 2023-07-17 – 2023-07-18 (×4): 10 mg via ORAL
  Filled 2023-07-17 (×4): qty 2

## 2023-07-17 MED ORDER — METHYLPREDNISOLONE SODIUM SUCC 40 MG IJ SOLR
20.0000 mg | Freq: Two times a day (BID) | INTRAMUSCULAR | Status: DC
  Administered 2023-07-17 – 2023-07-19 (×4): 20 mg via INTRAVENOUS
  Filled 2023-07-17 (×4): qty 1

## 2023-07-17 MED ORDER — POTASSIUM CHLORIDE CRYS ER 20 MEQ PO TBCR
40.0000 meq | EXTENDED_RELEASE_TABLET | Freq: Once | ORAL | Status: AC
  Administered 2023-07-17: 40 meq via ORAL
  Filled 2023-07-17: qty 2

## 2023-07-17 MED ORDER — BUDESONIDE 0.5 MG/2ML IN SUSP
0.5000 mg | Freq: Two times a day (BID) | RESPIRATORY_TRACT | Status: DC
  Administered 2023-07-17 – 2023-07-19 (×4): 0.5 mg via RESPIRATORY_TRACT
  Filled 2023-07-17 (×3): qty 2

## 2023-07-17 MED ORDER — IPRATROPIUM-ALBUTEROL 0.5-2.5 (3) MG/3ML IN SOLN
3.0000 mL | RESPIRATORY_TRACT | Status: DC
  Administered 2023-07-17: 3 mL via RESPIRATORY_TRACT

## 2023-07-17 MED ORDER — CHLORPROMAZINE HCL 25 MG PO TABS
75.0000 mg | ORAL_TABLET | Freq: Every day | ORAL | Status: DC
  Administered 2023-07-17 – 2023-07-18 (×3): 75 mg via ORAL
  Filled 2023-07-17: qty 1
  Filled 2023-07-17 (×2): qty 3

## 2023-07-17 MED ORDER — IPRATROPIUM-ALBUTEROL 0.5-2.5 (3) MG/3ML IN SOLN
3.0000 mL | Freq: Three times a day (TID) | RESPIRATORY_TRACT | Status: DC
  Administered 2023-07-18 – 2023-07-19 (×4): 3 mL via RESPIRATORY_TRACT
  Filled 2023-07-17 (×4): qty 3

## 2023-07-17 MED ORDER — LAMOTRIGINE 25 MG PO TABS
225.0000 mg | ORAL_TABLET | Freq: Every day | ORAL | Status: DC
  Administered 2023-07-17 – 2023-07-18 (×3): 225 mg via ORAL
  Filled 2023-07-17 (×3): qty 1

## 2023-07-17 MED ORDER — POLYSACCHARIDE IRON COMPLEX 150 MG PO CAPS
150.0000 mg | ORAL_CAPSULE | Freq: Every day | ORAL | Status: DC
  Administered 2023-07-17 – 2023-07-19 (×3): 150 mg via ORAL
  Filled 2023-07-17 (×4): qty 1

## 2023-07-17 MED ORDER — PERFLUTREN LIPID MICROSPHERE
1.0000 mL | INTRAVENOUS | Status: AC | PRN
  Administered 2023-07-17: 3 mL via INTRAVENOUS

## 2023-07-17 NOTE — Progress Notes (Signed)
Requested to do a bubble study for ECHO tech. Tolerated well.

## 2023-07-17 NOTE — ED Notes (Signed)
Humidity placed to O2 supply.

## 2023-07-17 NOTE — Consult Note (Signed)
[facility] Pulmonary Medicine Consultation      Date: 07/17/2023,   MRN# [id number] LATISA BELAY 1971-11-29    CHIEF COMPLAINT:   SOB and pneumonia   HISTORY OF PRESENT ILLNESS   52 y.o. female with medical history significant for heart disease, hypertension, chronic respiratory failure on 6 L at home, hypertension, obesity presenting from cardiology office episode of passing out.    Patient had history of stroke 2 years ago, has been having recurrent aspiration pneumonia for the last 6 months.    Modified barium swallow done last admission showed significant aspiration. Patient has severe physical debilitation and poor resp insufficiency  Patient was placed on antibiotics for aspiration pneumonia.  CT chest reviewed by me in detail   PAST MEDICAL HISTORY   Past Medical History:  Diagnosis Date   Acute respiratory failure with hypoxia (HCC) 06/24/2021   Arthralgia of temporomandibular joint    CAD, multiple vessel    a. 06/2016 Cath: ostLM 40%, ostLAD 40%, pLAD 95%, ost-pLCx 60%, pLCx 95%, mLCx 60%, mRCA 95%, D2 50%, LVSF nl;  b. 07/2016 CABG x 4 (LIMA->LAD, VG->Diag, VG->OM, VG->RCA); c. 08/2016 Cath: 3VD w/ 4/4 patent grafts. LAD distal to LIMA has diff dzs->Med rx; d. 08/2020 Cath: 4/4 patent grafts, native 3VD. EF 55-65%-->Med Rx.   Carotid arterial disease (HCC)    a. 07/2016 s/p R CEA; b. 02/2021 U/S: RICA 40-59%, LICA 1-39%.   Cerebrovascular disease    Clotting disorder (HCC)    Complex sleep apnea syndrome 10/03/2022   Depression    Diastolic dysfunction    a. 06/2016 Echo: EF 50-55%, mild inf wall HK, GR1DD, mild MR, RV sys fxn nl, mildly dilated LA, PASP nl; b. 06/2021 Echo: EF 60-65%, no rwma. Nl RV fxn.   Essential hypertension    Essential hypertension, malignant 06/28/2016   Fatty liver disease, nonalcoholic 2016   History of blood transfusion    with heart surgery   History of MI (myocardial infarction) (July 2017) 08/02/2020   HLD (hyperlipidemia)     HTN (hypertension), malignant 10/20/2013   Labile hypertension    a. prior renal ngiogram negative for RAS in 03/2016; b. catecholamines and metanephrines normal, mildly elevated renin with normal aldosterone and normal ratio in 02/2016   Mild tobacco abuse in early remission 06/28/2016   Myocardial infarction [facility]) 2017   Obesity    PAD (peripheral artery disease) (HCC)    a. 09/2018 s/p L SFA stenting; b. 07/2019 Periph Angio: Patent m/d L SFA stent w/ 100% L SFA distal to stent. L AT 100d, L Peroneal diff dzs-->Med Rx; c. 02/2021 ABIs: stable @ 0.61 on R and 0.46 on L.   PTSD (post-traumatic stress disorder)    Reflux gastritis 07/01/2023   Stroke (HCC)    Tobacco abuse    Type 2 diabetes mellitus (HCC) 12/2015     SURGICAL HISTORY   Past Surgical History:  Procedure Laterality Date   ABDOMINAL AORTOGRAM W/LOWER EXTREMITY [name] 10/15/2018   Procedure: ABDOMINAL AORTOGRAM W/LOWER EXTREMITY;  Surgeon: Iran Ouch, MD;  Location: [facility];  Service: Cardiovascular;  Laterality: [name];   ABDOMINAL AORTOGRAM W/LOWER EXTREMITY Bilateral 08/19/2019   Procedure: ABDOMINAL AORTOGRAM W/LOWER EXTREMITY;  Surgeon: Iran Ouch, MD;  Location: [facility];  Service: Cardiovascular;  Laterality: Bilateral;   CARDIAC CATHETERIZATION [name] 06/29/2016   Procedure: Left Heart Cath and Coronary Angiography;  Surgeon: Antonieta Iba, MD;  Location: [facility] INVASIVE CV LAB;  Service: Cardiovascular;  Laterality: [name];   CARDIAC CATHETERIZATION [name] 08/29/2016   Procedure: Left Heart Cath and Cors/Grafts Angiography;  Surgeon: Iran Ouch, MD;  Location: [facility];  Service: Cardiovascular;  Laterality: [name];   CESAREAN SECTION     CHOLECYSTECTOMY     CORONARY ARTERY BYPASS GRAFT [name] 07/06/2016   Procedure: CORONARY ARTERY BYPASS GRAFTING (CABG) x four, using left internal mammary artery and right leg greater saphenous vein harvested endoscopically;  Surgeon: Kerin Perna, MD;   Location: [facility] OR;  Service: Open Heart Surgery;  Laterality: [name];   ENDARTERECTOMY Right 07/06/2016   Procedure: ENDARTERECTOMY CAROTID;  Surgeon: Larina Earthly, MD;  Location: [facility] OR;  Service: Vascular;  Laterality: Right;   ENDARTERECTOMY Right 04/27/2020   Procedure: REDO OF RIGHT ENDARTERECTOMY CAROTID;  Surgeon: Larina Earthly, MD;  Location: [facility] OR;  Service: Vascular;  Laterality: Right;   LEFT HEART CATH AND CORS/GRAFTS ANGIOGRAPHY [name] 08/24/2020   Procedure: LEFT HEART CATH AND CORS/GRAFTS ANGIOGRAPHY;  Surgeon: Iran Ouch, MD;  Location: [facility];  Service: Cardiovascular;  Laterality: [name];   LEFT HEART CATH AND CORS/GRAFTS ANGIOGRAPHY [name] 01/29/2022   Procedure: LEFT HEART CATH AND CORS/GRAFTS ANGIOGRAPHY;  Surgeon: Iran Ouch, MD;  Location: [facility] INVASIVE CV LAB;  Service: Cardiovascular;  Laterality: [name];   PERIPHERAL VASCULAR CATHETERIZATION [name] 04/18/2016   Procedure: Renal Angiography;  Surgeon: Iran Ouch, MD;  Location: [facility];  Service: Cardiovascular;  Laterality: [name];   PERIPHERAL VASCULAR INTERVENTION Left 10/15/2018   Procedure: PERIPHERAL VASCULAR INTERVENTION;  Surgeon: Iran Ouch, MD;  Location: [facility];  Service: Cardiovascular;  Laterality: Left;  Left superficial femoral   TEE WITHOUT CARDIOVERSION [name] 07/06/2016   Procedure: TRANSESOPHAGEAL ECHOCARDIOGRAM (TEE);  Surgeon: Kerin Perna, MD;  Location: [facility] OR;  Service: Open Heart Surgery;  Laterality: [name];   TONSILLECTOMY       FAMILY HISTORY   Family History  Adopted: Yes  Problem Relation Age of Onset   Diabetes Mother    Diabetes Father    Alcohol abuse Father    Heart disease Father    Drug abuse Father    Stroke Sister    Anxiety disorder Sister      SOCIAL HISTORY   Social History   Tobacco Use   Smoking status: Former    Current packs/day: 0.00    Average packs/day: 0.5 packs/day for 30.0 years (15.0 ttl pk-yrs)    Types: Cigarettes     Start date: 42    Quit date: 2022    Years since quitting: 2.5    Passive exposure: Never   Smokeless tobacco: Never  Vaping Use   Vaping status: Every Day   Substances: Flavoring  Substance Use Topics   Alcohol use: Not Currently    Alcohol/week: 0.0 standard drinks of alcohol    Comment: socially   Drug use: No     MEDICATIONS    Home Medication:  Current Outpatient Rx   Order #: [id number] Class: OTC   Order #: [id number] Class: Normal   Order #: [id number] Class: Normal   Order #: [id number] Class: Normal   Order #: [id number] Class: Normal   Order #: [id number] Class: Normal   Order #: [id number] Class: Normal   Order #: [id number] Class: Normal   Order #: [id number] Class: Normal   Order #: [id number] Class: Normal   Order #: [id number] Class: Historical Med   Order #: [id number] Class: Normal   Order #: [id number] Class: Normal   Order #: [id number] Class: Normal  Order #: [id number] Class: Normal   Order #: [id number] Class: Normal   Order #: [id number] Class: Normal   Order #: [id number] Class: Normal   Order #: [id number] Class: Normal   Order #: [id number] Class: Normal   Order #: [id number] Class: Sample   Order #: [id number] Class: Normal   Order #: [id number] Class: Normal   Order #: [id number] Class: Normal    Current Medication:  Current Facility-Administered Medications:    acetaminophen (TYLENOL) tablet 325-650 mg, 325-650 mg, Oral, Q4H PRN, Gertha Calkin, MD   albuterol (PROVENTIL) (2.5 MG/3ML) 0.083% nebulizer solution 2.5 mg, 2.5 mg, Nebulization, Q4H PRN, Gertha Calkin, MD   arformoterol (BROVANA) nebulizer solution 15 mcg, 15 mcg, Nebulization, BID, 15 mcg at 07/17/23 0907 **AND** umeclidinium bromide (INCRUSE ELLIPTA) 62.5 MCG/ACT 1 puff, 1 puff, Inhalation, Daily, Irena Cords V, MD, 1 puff at 07/17/23 1126   cefTRIAXone (ROCEPHIN) 2 g in sodium chloride 0.9 % 100 mL IVPB, 2 g, Intravenous, Q24H, Gertha Calkin, MD, Stopped at 07/17/23 [phone]   chlorproMAZINE (THORAZINE) tablet 75 mg, 75  mg, Oral, QHS, Otelia Sergeant, RPH, 75 mg at 07/17/23 2376   clopidogrel (PLAVIX) tablet 75 mg, 75 mg, Oral, Daily, Irena Cords V, MD, 75 mg at 07/17/23 0906   escitalopram (LEXAPRO) tablet 20 mg, 20 mg, Oral, Daily, Irena Cords V, MD, 20 mg at 07/17/23 2831   fludrocortisone (FLORINEF) tablet 0.1 mg, 0.1 mg, Oral, Daily, Irena Cords V, MD, 0.1 mg at 07/17/23 0907   heparin injection 5,000 Units, 5,000 Units, Subcutaneous, Q8H, Gertha Calkin, MD, 5,000 Units at 07/17/23 1337   iron polysaccharides (NIFEREX) capsule 150 mg, 150 mg, Oral, Daily, Marrion Coy, MD   lamoTRIgine (LAMICTAL) tablet 225 mg, 225 mg, Oral, QHS, Belue, Lendon Collar, RPH, 225 mg at 07/17/23 5176   midodrine (PROAMATINE) tablet 5 mg, 5 mg, Oral, TID WC, Gertha Calkin, MD, 5 mg at 07/17/23 1126   oxyCODONE (Oxy IR/ROXICODONE) immediate release tablet 10 mg, 10 mg, Oral, Q12H PRN, Marrion Coy, MD, 10 mg at 07/17/23 0906   pregabalin (LYRICA) capsule 225 mg, 225 mg, Oral, BID, Gertha Calkin, MD, 225 mg at 07/17/23 0807   topiramate (TOPAMAX) tablet 25 mg, 25 mg, Oral, QHS, Otelia Sergeant, RPH, 25 mg at 07/17/23 0212  Current Outpatient Medications:    acetaminophen (TYLENOL) 325 MG tablet, Take 1-2 tablets (325-650 mg total) by mouth every 4 (four) hours as needed for mild pain., Disp: , Rfl:    albuterol (PROVENTIL) (2.5 MG/3ML) 0.083% nebulizer solution, Take 3 mLs (2.5 mg total) by nebulization every 4 (four) hours as needed for wheezing or shortness of breath., Disp: 75 mL, Rfl: 2   chlorproMAZINE (THORAZINE) 25 MG tablet, Take 3 tablets (75 mg total) by mouth at bedtime., Disp: 90 tablet, Rfl: 2   clonazePAM (KLONOPIN) 0.5 MG tablet, Take 0.5 tablets (0.25 mg total) by mouth 2 (two) times daily. Take 1/2 tab twice daily. No dissolving tablets., Disp: 30 tablet, Rfl: 2   clopidogrel (PLAVIX) 75 MG tablet, Take 1 tablet (75 mg total) by mouth daily., Disp: 90 tablet, Rfl: 3   cyclobenzaprine (FLEXERIL) 10 MG tablet, TAKE ONE  TABLET BY MOUTH AT BEDTIME, Disp: 30 tablet, Rfl: 3   escitalopram (LEXAPRO) 20 MG tablet, Take 1 tablet (20 mg total) by mouth daily., Disp: 30 tablet, Rfl: 2   Evolocumab (REPATHA SURECLICK) 140 MG/ML SOAJ, Inject 140 mg into the skin as directed. Inject 1 Dose into the skin every 14 (fourteen) days., Disp: 6 mL,  Rfl: 3   ferrous sulfate 324 (65 Fe) MG TBEC, Take 1 tablet (324 mg total) by mouth 2 (two) times daily., Disp: 60 tablet, Rfl: 2   fludrocortisone (FLORINEF) 0.1 MG tablet, Take 1 tablet (0.1 mg total) by mouth daily., Disp: 90 tablet, Rfl: 1   icosapent Ethyl (VASCEPA) 1 g capsule, Take 2 g by mouth 2 (two) times daily., Disp: , Rfl:    lamoTRIgine (LAMICTAL) 200 MG tablet, Take 1 tablet (200 mg total) by mouth at bedtime., Disp: 30 tablet, Rfl: 2   lamoTRIgine (LAMICTAL) 25 MG tablet, Take 1 tablet (25 mg total) by mouth daily. (Patient taking differently: Take 25 mg by mouth at bedtime.), Disp: 30 tablet, Rfl: 2   metoprolol succinate (TOPROL-XL) 25 MG 24 hr tablet, Take 1 tablet (25 mg total) by mouth daily., Disp: 90 tablet, Rfl: 1   midodrine (PROAMATINE) 5 MG tablet, Take 1 tablet (5 mg total) by mouth 3 (three) times daily with meals., Disp: 90 tablet, Rfl: 1   MOUNJARO 7.5 MG/0.5ML Pen, INJECT THE CONTENTS OF ONE PEN UNDER THE SKIN WEEKLY ON THE SAME DAY EACH WEEK, Disp: 6 mL, Rfl: 0   nitroGLYCERIN (NITROSTAT) 0.4 MG SL tablet, Place 1 tablet (0.4 mg total) under the tongue every 5 (five) minutes as needed for chest pain., Disp: 25 tablet, Rfl: 1   Oxycodone HCl 10 MG TABS, Take 1 tablet (10 mg total) by mouth 2 (two) times daily as needed., Disp: 60 tablet, Rfl: 0   pantoprazole (PROTONIX) 40 MG tablet, TAKE ONE TABLET BY MOUTH ONE TIME DAILY, Disp: 30 tablet, Rfl: 0   pregabalin (LYRICA) 225 MG capsule, Take 1 capsule (225 mg total) by mouth 2 (two) times daily., Disp: 60 capsule, Rfl: 3   Tiotropium Bromide-Olodaterol (STIOLTO RESPIMAT) 2.5-2.5 MCG/ACT AERS, Inhale 2 puffs  into the lungs daily., Disp: 4 g, Rfl: 0   topiramate (TOPAMAX) 25 MG tablet, Take 1 tablet (25 mg total) by mouth at bedtime., Disp: 90 tablet, Rfl: 3   Continuous Glucose Sensor (DEXCOM G7 SENSOR) MISC, APPLY ONE SENSOR TO THE BACK OF YOUR UPPER ARM. REPLACE EVERY 10 DAYS., Disp: 3 each, Rfl: 5   ULTICARE MINI PEN NEEDLES 31G X 6 MM MISC, USE PEN NEEDLES TWO TIMES DAILY, Disp: 100 each, Rfl: 3  Facility-Administered Medications Ordered in Other Encounters:    perflutren lipid microspheres (DEFINITY) IV suspension, 1-10 mL, Intravenous, PRN, Gertha Calkin, MD, 3 mL at 07/17/23 1201    ALLERGIES   Patient has no active allergies.     REVIEW OF SYSTEMS    Review of Systems:  Gen: fatigue  HEENT: Denies blurred vision, double vision, ear pain, eye pain, hearing loss, nose bleeds, sore throat Cardiac:  No dizziness, chest pain or heaviness, chest tightness,edema Resp:   Denies cough or sputum porduction, +shortness of breath,-wheezing, -hemoptysis,  Gi: + swallowing difficulty Gu:  Denies bladder incontinence, burning urine Ext:   Denies Joint pain, stiffness or swelling Skin: Denies  skin rash, easy bruising or bleeding or hives Endoc:  Denies polyuria, polydipsia , polyphagia or weight change Psych:   Denies depression, insomnia or hallucinations   Other:  All other systems negative   VS: BP 92/67   Pulse 74   Temp 97.7 F (36.5 C) (Oral)   Resp 13   Ht 5\' 6"  (1.676 m)   Wt 82.1 kg   SpO2 97%   BMI 29.21 kg/m      PHYSICAL EXAM  General Appearance:  No distress  EYES PERRLA, EOM intact.   NECK Supple, No JVD Pulmonary: normal breath sounds, +rhonchi  CardiovascularNormal S1,S2.  No m/r/g.   Abdomen: Benign, Soft, non-tender. Skin:   warm, no rashes, no ecchymosis  Extremities: normal, no cyanosis, clubbing. Neuro:without focal findings,  speech normal  PSYCHIATRIC: Mood, affect within normal limits.   ALL OTHER ROS ARE NEGATIVE      IMAGING     ECHOCARDIOGRAM COMPLETE BUBBLE STUDY  Result Date: 07/17/2023    ECHOCARDIOGRAM REPORT   Patient Name:   LEVINA BOYACK Date of Exam: 07/17/2023 Medical Rec #:  [id number]           Height:       66.0 in Accession #:    [id number]          Weight:       181.0 lb Date of Birth:  08/10/1971          BSA:          1.917 m Patient Age:    51 years            BP:           94/54 mmHg Patient Gender: F                   HR:           93 bpm. Exam Location:  [facility] Procedure: 2D Echo, Cardiac Doppler, Color Doppler, Saline Contrast Bubble Study            and Intracardiac Opacification Agent Indications:     Syncope  History:         Patient has prior history of Echocardiogram examinations, most                  recent 01/27/2022. Cardiomegaly and CHF, Previous Myocardial                  Infarction and CAD, Prior CABG, Stroke and COPD,                  Arrythmias:Bradycardia, Signs/Symptoms:Syncope, Chest Pain and                  Dyspnea; Risk Factors:Hypertension, Sleep Apnea, Diabetes,                  Current Smoker and Dyslipidemia.  Sonographer:     Mikki Harbor Referring Phys:  [id number] EKTA V PATEL Diagnosing Phys: Julien Nordmann MD  Sonographer Comments: Technically difficult study due to poor echo windows, suboptimal apical window and suboptimal subcostal window. IMPRESSIONS  1. Left ventricular ejection fraction, by estimation, is 60 to 65%. The left ventricle has normal function. The left ventricle has no regional wall motion abnormalities. There is mild left ventricular hypertrophy. Left ventricular diastolic parameters are consistent with Grade I diastolic dysfunction (impaired relaxation).  2. Right ventricular systolic function is normal. The right ventricular size is normal. Tricuspid regurgitation signal is inadequate for assessing PA pressure.  3. The mitral valve is normal in structure. No evidence of mitral valve regurgitation. No evidence of mitral stenosis.  4. The aortic valve has an  indeterminant number of cusps. Aortic valve regurgitation is not visualized. Aortic valve sclerosis is present, with no evidence of aortic valve stenosis.  5. The inferior vena cava is normal in size with greater than 50% respiratory variability, suggesting right atrial pressure of 3 mmHg.  6. Agitated saline contrast bubble study was negative, with no  evidence of any interatrial shunt. FINDINGS  Left Ventricle: Left ventricular ejection fraction, by estimation, is 60 to 65%. The left ventricle has normal function. The left ventricle has no regional wall motion abnormalities. Definity contrast agent was given IV to delineate the left ventricular  endocardial borders. The left ventricular internal cavity size was normal in size. There is mild left ventricular hypertrophy. Left ventricular diastolic parameters are consistent with Grade I diastolic dysfunction (impaired relaxation). Right Ventricle: The right ventricular size is normal. No increase in right ventricular wall thickness. Right ventricular systolic function is normal. Tricuspid regurgitation signal is inadequate for assessing PA pressure. Left Atrium: Left atrial size was normal in size. Right Atrium: Right atrial size was normal in size. Pericardium: There is no evidence of pericardial effusion. Mitral Valve: The mitral valve is normal in structure. Mild mitral annular calcification. No evidence of mitral valve regurgitation. No evidence of mitral valve stenosis. MV peak gradient, 6.6 mmHg. The mean mitral valve gradient is 3.0 mmHg. Tricuspid Valve: The tricuspid valve is normal in structure. Tricuspid valve regurgitation is not demonstrated. No evidence of tricuspid stenosis. Aortic Valve: The aortic valve has an indeterminant number of cusps. Aortic valve regurgitation is not visualized. Aortic valve sclerosis is present, with no evidence of aortic valve stenosis. Aortic valve mean gradient measures 5.0 mmHg. Aortic valve peak gradient measures 8.1  mmHg. Aortic valve area, by VTI measures 2.19 cm. Pulmonic Valve: The pulmonic valve was normal in structure. Pulmonic valve regurgitation is not visualized. No evidence of pulmonic stenosis. Aorta: The aortic root is normal in size and structure. Venous: The inferior vena cava is normal in size with greater than 50% respiratory variability, suggesting right atrial pressure of 3 mmHg. IAS/Shunts: No atrial level shunt detected by color flow Doppler. Agitated saline contrast was given intravenously to evaluate for intracardiac shunting. Agitated saline contrast bubble study was negative, with no evidence of any interatrial shunt. There  is no evidence of a patent foramen ovale. There is no evidence of an atrial septal defect.  LEFT VENTRICLE PLAX 2D LVIDd:         4.30 cm   Diastology LVIDs:         3.00 cm   LV e' medial:    6.74 cm/s LV PW:         1.20 cm   LV E/e' medial:  14.7 LV IVS:        1.20 cm   LV e' lateral:   8.32 cm/s LVOT diam:     2.00 cm   LV E/e' lateral: 11.9 LV SV:         75 LV SV Index:   39 LVOT Area:     3.14 cm  RIGHT VENTRICLE RV Basal diam:  3.25 cm RV Mid diam:    3.60 cm RV S prime:     11.10 cm/s TAPSE (M-mode): 2.0 cm LEFT ATRIUM             Index        RIGHT ATRIUM           Index LA diam:        3.30 cm 1.72 cm/m   RA Area:     13.30 cm LA Vol (A2C):   37.1 ml 19.36 ml/m  RA Volume:   33.90 ml  17.69 ml/m LA Vol (A4C):   40.5 ml 21.13 ml/m LA Biplane Vol: 38.2 ml 19.93 ml/m  AORTIC VALVE  PULMONIC VALVE AV Area (Vmax):    2.70 cm     PV Vmax:       1.09 m/s AV Area (Vmean):   2.62 cm     PV Peak grad:  4.8 mmHg AV Area (VTI):     2.19 cm AV Vmax:           142.00 cm/s AV Vmean:          99.700 cm/s AV VTI:            0.343 m AV Peak Grad:      8.1 mmHg AV Mean Grad:      5.0 mmHg LVOT Vmax:         122.00 cm/s LVOT Vmean:        83.300 cm/s LVOT VTI:          0.239 m LVOT/AV VTI ratio: 0.70  AORTA Ao Root diam: 3.30 cm MITRAL VALVE MV Area (PHT): 4.17  cm     SHUNTS MV Area VTI:   2.95 cm     Systemic VTI:  0.24 m MV Peak grad:  6.6 mmHg     Systemic Diam: 2.00 cm MV Mean grad:  3.0 mmHg MV Vmax:       1.28 m/s MV Vmean:      73.3 cm/s MV Decel Time: 182 msec MV E velocity: 99.00 cm/s MV A velocity: 111.00 cm/s MV E/A ratio:  0.89 Julien Nordmann MD Electronically signed by Julien Nordmann MD Signature Date/Time: 07/17/2023/1:46:02 PM    Final    MR BRAIN WO CONTRAST  Result Date: 07/17/2023 CLINICAL DATA:  Initial evaluation for syncope/presyncope. EXAM: MRI HEAD WITHOUT CONTRAST TECHNIQUE: Multiplanar, multiecho pulse sequences of the brain and surrounding structures were obtained without intravenous contrast. COMPARISON:  Prior MRI from 06/03/2023. FINDINGS: Brain: Cerebral volume within normal limits. Patchy T2/FLAIR hyperintensity involving the periventricular and deep white matter, consistent with chronic small vessel ischemic disease, stable. Remote cortical infarcts involving the right parietal and occipital lobes. Remote right pontine infarct. FLAIR signal abnormality extending into the middle cerebellar peduncles could reflect gliosis and/or degeneration, stable. Additional small remote lacunar infarcts about the left basal ganglia and left thalamus, also unchanged. No abnormal foci of restricted diffusion to suggest acute or subacute ischemia. Gray-white matter distension otherwise maintained. No acute intracranial hemorrhage. Few punctate chronic micro hemorrhages noted, likely small vessel related, stable. No mass lesion, midline shift or mass effect. No hydrocephalus or extra-axial fluid collection. Pituitary gland suprasellar region within normal limits. Segment beyond the takeoff of the left PICA, which could be related to slow flow and/or occlusion, stable. Major intracranial vascular flow voids are otherwise maintained. Skull and upper cervical spine: Craniocervical junction within normal limits. Diffusely decreased T1 signal intensity noted  throughout the visualized bone marrow, nonspecific, but most commonly related to anemia, smoking or obesity. No visible focal marrow replacing lesion. No scalp soft tissue abnormality. Sinuses/Orbits: Globes orbital soft tissues within normal limits. Paranasal sinuses are largely clear. Right-to-left nasal septal deviation with associated concha bullosa. No mastoid effusion. Other: None. IMPRESSION: 1. No acute intracranial abnormality. 2. Chronic right parieto-occipital and right pontine infarcts, with additional remote lacunar infarcts about the left basal ganglia and left thalamus, stable. 3. Underlying moderate chronic microvascular ischemic disease, stable. Electronically Signed   By: Rise Mu M.D.   On: 07/17/2023 00:29   DG Shoulder Left  Result Date: 07/16/2023 CLINICAL DATA:  Abnormal chest x-ray EXAM: LEFT SHOULDER - 2+ VIEW COMPARISON:  Chest  x-ray earlier today FINDINGS: There is no evidence of fracture or dislocation. There is no evidence of arthropathy or other focal bone abnormality. Soft tissues are unremarkable. IMPRESSION: Negative. Electronically Signed   By: Charlett Nose M.D.   On: 07/16/2023 23:20   CT Angio Chest Pulmonary Embolism (PE) W or WO Contrast  Result Date: 07/16/2023 CLINICAL DATA:  Pulmonary embolism (PE) suspected, high prob EXAM: CT ANGIOGRAPHY CHEST WITH CONTRAST TECHNIQUE: Multidetector CT imaging of the chest was performed using the standard protocol during bolus administration of intravenous contrast. Multiplanar CT image reconstructions and MIPs were obtained to evaluate the vascular anatomy. RADIATION DOSE REDUCTION: This exam was performed according to the departmental dose-optimization program which includes automated exposure control, adjustment of the mA and/or kV according to patient size and/or use of iterative reconstruction technique. CONTRAST:  75mL OMNIPAQUE IOHEXOL 350 MG/ML SOLN COMPARISON:  Chest x-ray today FINDINGS: Cardiovascular: No  filling defects in the pulmonary arteries to suggest pulmonary emboli. Heart is normal size. Aorta is normal caliber. Prior CABG. Aortic atherosclerosis. Mediastinum/Nodes: No axillary adenopathy. Mildly prominent mediastinal and bilateral hilar lymph nodes, stable since prior study. Mildly distended esophagus with fluid. This is stable since prior study. Lungs/Pleura: Bilateral lower lobe airspace disease, slightly worsened since prior CT concerning for multifocal pneumonia. No effusions. Upper Abdomen: No acute findings Musculoskeletal: Chest wall soft tissues are unremarkable. No acute bony abnormality. Review of the MIP images confirms the above findings. IMPRESSION: No evidence of pulmonary embolus. Bilateral lower lobe airspace opacities, increased since prior CT compatible with worsening multifocal pneumonia. Stable prominent mediastinal and bilateral hilar lymph nodes, likely reactive. Stable patulous fluid-filled esophagus. Aortic Atherosclerosis (ICD10-I70.0). Electronically Signed   By: Charlett Nose M.D.   On: 07/16/2023 23:13   DG Chest 2 View  Result Date: 07/16/2023 CLINICAL DATA:  Cough, pneumonia EXAM: CHEST - 2 VIEW COMPARISON:  Previous studies including the examination of 07/03/2023 FINDINGS: There is poor inspiration. There are patchy infiltrates in both lower lung fields. There is interval worsening of infiltrates in left lower lung field. Costophrenic angles are clear. There is no pneumothorax. There is previous coronary bypass surgery. There is possible offset in alignment of left humeral head and glenoid. This finding is not fully evaluated. IMPRESSION: There are patchy infiltrates in both lower lung fields. There is interval worsening of infiltrates in left lower lung field. Findings suggest possible multifocal pneumonia with interval worsening in left lower lung field. There is possible offset in alignment of left humeral head and glenoid. If clinically warranted, routine radiographs of  left shoulder may be considered. Electronically Signed   By: Ernie Avena M.D.   On: 07/16/2023 15:37   DG Swallowing Func-Speech Pathology  Result Date: 07/05/2023 Table formatting from the original result was not included. Modified Barium Swallow Study Patient Details Name: BRENLYN BESHARA MRN: [id number] Date of Birth: 1971-01-21 Today's Date: 07/05/2023 HPI/PMH: HPI: Per H&P: "ELDORA NAPP is a 52 y.o. female with medical history significant of multiple medical issues including chronic respiratory failure on 6 L, multivessel CAD status post CABG, carotid artery disease, R CVA with chronic left-sided deficits, hypertension, peripheral artery disease status post stenting, type 2 diabetes, hyperlipidemia, HFpEF complex deep apnea syndrome presenting with acute on chronic respiratory failure with hypoxia, multifocal pneumonia with concern for aspiration, left-sided weakness. Noted recent admission at the end of June for similar issues including sepsis and aspiration pneumonia.  CT of the chest with bibasilar patchy consolidative opacities concerning for multifocal pneumonia. Also  with patulous fluid-filled esophagus and minimal layering debris concerning for intermittent recurrent aspiration.".  Pt currently on 6L O2 (consistent with home setting) and a regular solids and thin liquids diet - she states she drinks from a "Stanley cup w/ Straw" at home in a recliner where she sits much of the day per her report. Clinical Impression: Patient presents with moderate and chronic pharyngeal phase dysphagia w/ mild oral phase dysphagia. Similar was noted per MBSS 06/2021 s/p R CVA, w/ resulting L sided weakness. Oral stage is characterized by inconsistnet lip closure w/ escape of thin liquids x2 and min increased bolus preparation and mastication time w/ solids. Bolus containment and anterior to posterior transit time was functional. Swallow initiation occurs at the level of the pyriform sinuses for thin  liquids, spilling to the p.s. for nectar liquids, and the valleculae for remaining consistencies. Pharyngeal stage is noted for functional tongue base retraction, adequate hyolaryngeal excursion, and adequate pharyngeal constriction. Epiglottic deflection is complete during timely pharyngeal swallows. HOWEVER, w/ thin liquid consistencies and the delay in pharyngeal swallow initiation, laryngeal penetration and aspiration(x1 trial) occurs d/t the decreased pharyngeal sensation and reduced timing of epiglottic inversion. This results in laryngeal penetration(to the cords x1) and aspiration(x1) w/ thins; coating along underneath side of the epiglottis w/ nectar liquids. Pt exhibited delayed sensation to the aspiration; no apparent sensation to the laryngeal penetration occurring. Instructed pt on using a throat/re-swallow to aid clearing of vestibule. No overt pharyngeal residue noted; pharyngeal stripping wave is complete. Amplitude/duration of cricopharyngeus opening is WFL. There is adequate/complete clearance through the cervical esophagus. Consistencies tested were thin liquids x2 tsps, 2-3 sequential sips(x3), nectar x1 tsp, 1 cup sip, 2 sequential cup sips, honey x1 tsp, pudding x1 tsp, regular solid (1/2 graham cracker with pudding).  Recommend patient continue regular diet with meats/foods well-Cut and Moistened; thin liquids VIA CUP ONLY -- NO STRAWS. Instructed on throat clear-re-swallow when drinking liquids, and general aspiration precautions including moistening foods well fo rease of chewing and SMALL bites/sips. Educated pt verbally and in handout form re: strategy and precautions. Factors that may increase risk of adverse event in presence of aspiration Rubye Oaks & Clearance Coots 2021): Factors that may increase risk of adverse event in presence of aspiration Rubye Oaks & Clearance Coots 2021): Limited mobility (prior R CVA) Recommendations/Plan: Swallowing Evaluation Recommendations Swallowing Evaluation Recommendations  Recommendations: PO diet PO Diet Recommendation: Regular; Thin liquids (Level 0) (foods/meats Cut Small and Moistened) Liquid Administration via: Cup; No straw Medication Administration: Whole meds with puree (baseline now) Supervision: Patient able to self-feed; Intermittent supervision/cueing for swallowing strategies; Set-up assistance for safety Swallowing strategies  : Minimize environmental distractions; Slow rate; Small bites/sips; Check for pocketing or oral holding; Check for anterior loss; Follow solids with liquids Postural changes: Position pt fully upright for meals; Stay upright 30-60 min after meals; Out of bed for meals Oral care recommendations: Oral care BID (2x/day); Pt independent with oral care (w/ setup support) Recommended consults: -- (Neurology) Caregiver Recommendations: -- (avoid Mixed consistencies; Cream Soups recommended) F/u w/ education on dysphagia/precautions as recommended and support at next venue of care as indicated/desired. Treatment Plan Treatment Plan Treatment recommendations: Defer treatment plan to SLP at other venue (see follow-up recommendations) Follow-up recommendations: Home health SLP (next venue of care if desired) Recommendations Comment: ongoing education as needed Functional status assessment: Patient has had a recent decline in their functional status and/or demonstrates limited ability to make significant improvements in function in a reasonable and predictable amount of time.  Treatment frequency: -- (n/a) Treatment duration: -- (n/a) Interventions: Aspiration precaution training; Patient/family education Recommendations Recommendations for follow up therapy are one component of a multi-disciplinary discharge planning process, led by the attending physician.  Recommendations may be updated based on patient status, additional functional criteria and insurance authorization. Assessment: Orofacial Exam: Orofacial Exam Oral Cavity: Oral Hygiene: WFL Oral Cavity -  Dentition: Dentures, top; Dentures, bottom Orofacial Anatomy: WFL Oral Motor/Sensory Function: Suspected cranial nerve impairment (slight) CN V - Trigeminal: Left sensory impairment CN VII - Facial: Left motor impairment CN IX - Glossopharyngeal, CN X - Vagus: Left motor impairment CN XII - Hypoglossal: Left motor impairment Anatomy: Anatomy: WFL Boluses Administered: Boluses Administered Boluses Administered: Thin liquids (Level 0); Mildly thick liquids (Level 2, nectar thick); Moderately thick liquids (Level 3, honey thick); Puree; Solid  Oral Impairment Domain: Oral Impairment Domain Lip Closure: Escape progressing to mid-chin (thins) Tongue control during bolus hold: Cohesive bolus between tongue to palatal seal Bolus preparation/mastication: Slow prolonged chewing/mashing with complete recollection (w/ solid) Bolus transport/lingual motion: Delayed initiation of tongue motion (oral holding) (x1) Oral residue: Trace residue lining oral structures (w/ solid then cleared w/ lingual sweep and f/u swallow) Location of oral residue : Tongue (w/ solid) Initiation of pharyngeal swallow : Pyriform sinuses (w/ thins, spilling to w/ Nectar; Valleculae w/ puree/solid)  Pharyngeal Impairment Domain: Pharyngeal Impairment Domain Soft palate elevation: No bolus between soft palate (SP)/pharyngeal wall (PW) Laryngeal elevation: Complete superior movement of thyroid cartilage with complete approximation of arytenoids to epiglottic petiole Anterior hyoid excursion: Complete anterior movement Epiglottic movement: Complete inversion Laryngeal vestibule closure: Incomplete, narrow column air/contrast in laryngeal vestibule Pharyngeal stripping wave : Present - complete Pharyngeal contraction (A/P view only): [name] Pharyngoesophageal segment opening: Complete distension and complete duration, no obstruction of flow Tongue base retraction: No contrast between tongue base and posterior pharyngeal wall (PPW) Pharyngeal residue: Complete  pharyngeal clearance Location of pharyngeal residue: [name]  Esophageal Impairment Domain: Esophageal Impairment Domain Esophageal clearance upright position: Complete clearance, esophageal coating Pill: Pill Consistency administered: -- (NT) Penetration/Aspiration Scale Score: Penetration/Aspiration Scale Score 1.  Material does not enter airway: Moderately thick liquids (Level 3, honey thick); Puree; Solid 2.  Material enters airway, remains ABOVE vocal cords then ejected out: Mildly thick liquids (Level 2, nectar thick) (w/ swallow) 3.  Material enters airway, remains ABOVE vocal cords and not ejected out: Thin liquids (Level 0) 6.  Material enters airway, passes BELOW cords then ejected out: Thin liquids (Level 0) (x1 post spontaneous cough) Compensatory Strategies: Compensatory Strategies Compensatory strategies: Yes Other(comment): -- (throat clear/swallow post laryngeal penetration of thins appeared to aid clearing of vestibule)   General Information: Caregiver present: No  Diet Prior to this Study: Regular; Thin liquids (Level 0) (w/ straw use)   Temperature : Normal (WBC not elevated)   Respiratory Status: WFL   Supplemental O2: Nasal cannula (6L)   History of Recent Intubation: No  Behavior/Cognition: Alert; Cooperative; Pleasant mood (slightly impulsive) Self-Feeding Abilities: Able to self-feed; Needs set-up for self-feeding; Needs assist with self-feeding (Supervision beneficial) Baseline vocal quality/speech: Normal (slight dysarthria) Volitional Cough: Able to elicit Volitional Swallow: Able to elicit Exam Limitations: No limitations Goal Planning: Prognosis for improved oropharyngeal function: Fair Barriers to Reach Goals: Time post onset; Severity of deficits; Behavior Barriers/Prognosis Comment: Prior R CVA w/ L sided weakness Patient/Family Stated Goal: to drink my coffee Consulted and agree with results and recommendations: Patient; Physician; Nurse Pain: Pain Assessment Pain Assessment: No/denies  pain Pain Score: 9  Breathing: 0 Negative Vocalization: 0 Facial Expression: 0 Body Language: 0 Consolability: 0 PAINAD Score: 0 Pain Location: cluster HA's (pt reports h/o these) Pain Descriptors / Indicators: Headache Pain Intervention(s): Limited activity within patient's tolerance; Monitored during session; Premedicated before session; Repositioned (pt reports recent pain meds for HA) End of Session: Start Time:SLP Start Time (ACUTE ONLY): 0810 Stop Time: SLP Stop Time (ACUTE ONLY): 0930 Time Calculation:SLP Time Calculation (min) (ACUTE ONLY): 80 min Charges: SLP Evaluations $ SLP Speech Visit: 1 Visit SLP Evaluations $BSS Swallow: 1 Procedure $MBS Swallow: 1 Procedure SLP visit diagnosis: SLP Visit Diagnosis: Dysphagia, oropharyngeal phase (R13.12) Past Medical History: Past Medical History: Diagnosis Date  Acute respiratory failure with hypoxia (HCC) 06/24/2021  Arthralgia of temporomandibular joint   CAD, multiple vessel   a. 06/2016 Cath: ostLM 40%, ostLAD 40%, pLAD 95%, ost-pLCx 60%, pLCx 95%, mLCx 60%, mRCA 95%, D2 50%, LVSF nl;  b. 07/2016 CABG x 4 (LIMA->LAD, VG->Diag, VG->OM, VG->RCA); c. 08/2016 Cath: 3VD w/ 4/4 patent grafts. LAD distal to LIMA has diff dzs->Med rx; d. 08/2020 Cath: 4/4 patent grafts, native 3VD. EF 55-65%-->Med Rx.  Carotid arterial disease (HCC)   a. 07/2016 s/p R CEA; b. 02/2021 U/S: RICA 40-59%, LICA 1-39%.  Cerebrovascular disease   Clotting disorder (HCC)   Complex sleep apnea syndrome 10/03/2022  Depression   Diastolic dysfunction   a. 06/2016 Echo: EF 50-55%, mild inf wall HK, GR1DD, mild MR, RV sys fxn nl, mildly dilated LA, PASP nl; b. 06/2021 Echo: EF 60-65%, no rwma. Nl RV fxn.  Fatty liver disease, nonalcoholic 2016  History of blood transfusion   with heart surgery  HLD (hyperlipidemia)   Labile hypertension   a. prior renal ngiogram negative for RAS in 03/2016; b. catecholamines and metanephrines normal, mildly elevated renin with normal aldosterone and normal ratio in 02/2016   Myocardial infarction [facility]) 2017  Obesity   PAD (peripheral artery disease) (HCC)   a. 09/2018 s/p L SFA stenting; b. 07/2019 Periph Angio: Patent m/d L SFA stent w/ 100% L SFA distal to stent. L AT 100d, L Peroneal diff dzs-->Med Rx; c. 02/2021 ABIs: stable @ 0.61 on R and 0.46 on L.  PTSD (post-traumatic stress disorder)   Stroke (HCC)   Tobacco abuse   Type 2 diabetes mellitus (HCC) 12/2015 Past Surgical History: Past Surgical History: Procedure Laterality Date  ABDOMINAL AORTOGRAM W/LOWER EXTREMITY [name] 10/15/2018  Procedure: ABDOMINAL AORTOGRAM W/LOWER EXTREMITY;  Surgeon: Iran Ouch, MD;  Location: [facility];  Service: Cardiovascular;  Laterality: [name];  ABDOMINAL AORTOGRAM W/LOWER EXTREMITY Bilateral 08/19/2019  Procedure: ABDOMINAL AORTOGRAM W/LOWER EXTREMITY;  Surgeon: Iran Ouch, MD;  Location: [facility];  Service: Cardiovascular;  Laterality: Bilateral;  CARDIAC CATHETERIZATION [name] 06/29/2016  Procedure: Left Heart Cath and Coronary Angiography;  Surgeon: Antonieta Iba, MD;  Location: [facility] CV [facility];  Service: Cardiovascular;  Laterality: [name];  CARDIAC CATHETERIZATION [name] 08/29/2016  Procedure: Left Heart Cath and Cors/Grafts Angiography;  Surgeon: Iran Ouch, MD;  Location: [facility];  Service: Cardiovascular;  Laterality: [name];  CESAREAN SECTION    CHOLECYSTECTOMY    CORONARY ARTERY BYPASS GRAFT [name] 07/06/2016  Procedure: CORONARY ARTERY BYPASS GRAFTING (CABG) x four, using left internal mammary artery and right leg greater saphenous vein harvested endoscopically;  Surgeon: Kerin Perna, MD;  [facility];  Service: Open Heart Surgery;  Laterality: [name];  ENDARTERECTOMY Right 07/06/2016  Procedure: ENDARTERECTOMY CAROTID;  Surgeon: Larina Earthly, MD;  Location:  [facility] OR;  Service: Vascular;  Laterality: Right;  ENDARTERECTOMY Right 04/27/2020  Procedure: REDO OF RIGHT ENDARTERECTOMY CAROTID;  Surgeon: Larina Earthly, MD;  Location: [facility] OR;  Service: Vascular;   Laterality: Right;  LEFT HEART CATH AND CORS/GRAFTS ANGIOGRAPHY [name] 08/24/2020  Procedure: LEFT HEART CATH AND CORS/GRAFTS ANGIOGRAPHY;  Surgeon: Iran Ouch, MD;  Location: [facility];  Service: Cardiovascular;  Laterality: [name];  LEFT HEART CATH AND CORS/GRAFTS ANGIOGRAPHY [name] 01/29/2022  Procedure: LEFT HEART CATH AND CORS/GRAFTS ANGIOGRAPHY;  Surgeon: Iran Ouch, MD;  Location: [facility];  Service: Cardiovascular;  Laterality: [name];  PERIPHERAL VASCULAR CATHETERIZATION [name] 04/18/2016  Procedure: Renal Angiography;  Surgeon: Iran Ouch, MD;  Location: [facility];  Service: Cardiovascular;  Laterality: [name];  PERIPHERAL VASCULAR INTERVENTION Left 10/15/2018  Procedure: PERIPHERAL VASCULAR INTERVENTION;  Surgeon: Iran Ouch, MD;  Location: [facility];  Service: Cardiovascular;  Laterality: Left;  Left superficial femoral  TEE WITHOUT CARDIOVERSION [name] 07/06/2016  Procedure: TRANSESOPHAGEAL ECHOCARDIOGRAM (TEE);  Surgeon: Kerin Perna, MD;  Location: [facility] OR;  Service: Open Heart Surgery;  Laterality: [name];  TONSILLECTOMY   Jerilynn Som, MS, CCC-SLP Speech Language Pathologist Rehab Services; [facility] [phone] (ascom) Watson,Katherine 07/05/2023, 9:50 AM  MR BRAIN WO CONTRAST  Result Date: 07/03/2023 CLINICAL DATA:  History of CVA with residual left-sided weakness presents with multiple episodes of passing out. EXAM: MRI HEAD WITHOUT CONTRAST TECHNIQUE: Multiplanar, multiecho pulse sequences of the brain and surrounding structures were obtained without intravenous contrast. COMPARISON:  CT head 06/11/2023, brain MRI 01/26/2022 FINDINGS: Brain: There is no acute intracranial hemorrhage, extra-axial fluid collection, or acute infarct. Background parenchymal volume is normal. Remote infarcts in the right parietal and occipital lobes are unchanged, with unchanged slight ex vacuo dilatation of the right lateral ventricle. Additional remote infarcts  in the pons, left basal ganglia, and left thalamus are also unchanged. Gliosis oral and degeneration in the bilateral middle cerebellar peduncles is unchanged. Additional patchy FLAIR signal abnormality in the supratentorial white matter consistent with underlying chronic small-vessel ischemic change is stable, accelerated for age. The pituitary and suprasellar region are normal. There is no mass lesion. There is no mass effect or midline shift. Vascular: Normal flow voids. Skull and upper cervical spine: Normal marrow signal. Sinuses/Orbits: The paranasal sinuses are clear. The globes and orbits are unremarkable. Other: The mastoid air cells and middle ear cavities are clear. IMPRESSION: 1. No acute intracranial pathology. 2. Unchanged remote infarcts and background chronic small-vessel ischemic change as above. Electronically Signed   By: Lesia Hausen M.D.   On: 07/03/2023 15:32   CT Angio Chest PE W/Cm &/Or Wo Cm  Result Date: 07/03/2023 CLINICAL DATA:  Shortness of breath, history of COPD. Recently diagnosed with multifocal pneumonia in June 2024. EXAM: CT ANGIOGRAPHY CHEST WITH CONTRAST TECHNIQUE: Multidetector CT imaging of the chest was performed using the standard protocol during bolus administration of intravenous contrast. Multiplanar CT image reconstructions and MIPs were obtained to evaluate the vascular anatomy. RADIATION DOSE REDUCTION: This exam was performed according to the departmental dose-optimization program which includes automated exposure control, adjustment of the mA and/or kV according to patient size and/or use of iterative reconstruction technique. CONTRAST:  75mL OMNIPAQUE IOHEXOL 350 MG/ML SOLN COMPARISON:  Chest CT June 12, 2023 and May 29, 2023 FINDINGS: Cardiovascular: Satisfactory opacification of the pulmonary arteries to the segmental level. No evidence of pulmonary embolism. Normal heart size. No pericardial effusion. Severe coronary artery  atherosclerosis. Thoracic aortic  atherosclerosis. Status post median sternotomy and CABG. Mediastinum/Nodes: The thyroid gland is within normal limits. No axillary lymphadenopathy. Unchanged prominent mediastinal and hilar lymph nodes. An index right hilar lymph node measures up to 13 mm in short axis on series 4, image 64. Patulous and fluid-filled esophagus. Lungs/Pleura: Patent central airways. Minimal layering debris in the proximal trachea. Respiratory motion artifact slightly limits evaluation of the lung parenchyma. Bibasilar patchy consolidative opacities with air bronchograms, slightly decreased compared to June 12, 2023. No new focal pulmonary opacity. No pleural effusion or pneumothorax. Upper Abdomen: No acute abnormality. Musculoskeletal: No chest wall abnormality. No acute or significant osseous findings. Review of the MIP images confirms the above findings. IMPRESSION: 1. Bibasilar patchy consolidative opacities, decreased in extent since June 12, 2023, likely representing evolving multifocal pneumonia. 2. Patulous fluid-filled esophagus and minimal layering debris in the proximal trachea, raising concern for intermittent/recurrent aspiration. 3. Unchanged prominent mediastinal and hilar lymph nodes, likely reactive. Electronically Signed   By: Jacob Moores M.D.   On: 07/03/2023 12:56   DG Chest Port 1 View  Result Date: 07/03/2023 CLINICAL DATA:  Provided history: Questionable sepsis-evaluate for abnormality. Shortness of breath. Syncopal episode. EXAM: PORTABLE CHEST 1 VIEW COMPARISON:  Prior chest radiographs 06/24/2023 and earlier. FINDINGS: Prior median sternotomy/CABG. Redemonstrated fractures within superior sternotomy wires. The cardiomediastinal silhouette is unchanged. Ill-defined opacity within the right lung base, progressed from the prior examination of 06/24/2023. No appreciable airspace consolidation on the left. No evidence of pleural effusion or pneumothorax. No acute osseous abnormality identified.  IMPRESSION: Ill-defined opacity within the right lung base. This is progressed from the prior examination of 06/24/2023 and may reflect atelectasis and/or airspace consolidation. A superimposed component of scarring is possible. Electronically Signed   By: Jackey Loge D.O.   On: 07/03/2023 11:55   DG Chest 2 View  Result Date: 06/24/2023 CLINICAL DATA:  Follow-up pneumonia EXAM: CHEST - 2 VIEW COMPARISON:  Chest x-ray dated June 12, 2023 FINDINGS: Cardiac and mediastinal contours are unchanged. Prior median sternotomy with unchanged fractures of the superior wires. Interval improved aeration of the right lung base. Medial right lower lobe opacity has been present on multiple priors and is likely due to scarring. No evidence of pleural effusion or pneumothorax. IMPRESSION: 1. Interval improved aeration of the right lung base. 2. Persistent medial right lower lobe opacity which has been present on multiple priors and is likely due to scarring. Follow-up chest CT could be performed for better evaluation. Electronically Signed   By: Allegra Lai M.D.   On: 06/24/2023 17:20      ASSESSMENT/PLAN   52 yo obese white female with debilitating CVA with acute on chronic hypoxic resp failure due to aspiration pneumonia COPD exacerbation with orthostatic hypotension  Overall prognosis is very poor Very low chance of meaningful recovery  COPD Start IV steroids Continue IV abx Start NEB therapy Oxygen as needed  RESP INSUFFICIENCY PT-chest PT ST swallow eval  ORTHOSTATIC HYPOTENSION Follow up cardiology recs    Lucie Leather, M.D.  [facility]  Medical Director [facility] Medical Director [facility] Cardio-Pulmonary Department

## 2023-07-17 NOTE — Progress Notes (Signed)
Progress Note   Patient: Erika [name] IEP:[id number] DOB: [birth year] DOA: 07/16/2023     1 DOS: the patient was seen and examined on 07/17/2023   Brief hospital course: Erika [name] is a 52 y.o. female with medical history significant for heart disease, hypertension, chronic respiratory failure on 6 L at home, hypertension, obesity presenting from cardiology office episode of passing out.  Patient had history of stroke 2 years ago, has been having recurrent aspiration pneumonia for the last 6 months.  Modified barium swallow done last admission showed significant aspiration. Upon arriving the hospital, patient was found to have increased heart rate, significant cytosis with white cell 24.2, lactic acid 3.0.  Procalcitonin level 1.98.  Patient was placed on antibiotics for aspiration pneumonia.   Principal Problem:   Syncope and collapse Active Problems:   Aspiration pneumonia of both lungs (HCC)   Orthostatic hypotension   Anemia   Chronic diastolic CHF (congestive heart failure) (HCC)   Hemiplegia and hemiparesis following cerebral infarction affecting left non-dominant side (HCC)   Type 2 diabetes mellitus with other specified complication (HCC)   GERD (gastroesophageal reflux disease)   Severe sepsis (HCC)   Reactive thrombocytosis   Infestation by bed bug   Chronic hypotension   Chronic hypoxemic respiratory failure (HCC)   Hypokalemia   Iron deficiency anemia   Assessment and Plan:  * Syncope and collapse Chronic Orthostatic hypotension Patient has a chronic hypotension, was followed by cardiology, currently treated with midodrine as well as Florinef. Syncope will appear to be secondary to also to hypotension in addition to pneumonia. Will continue to follow.   Aspiration pneumonia of both lungs (HCC) Severe sepsis secondary to aspiration pneumonia. Chronic hypoxemic respiratory failure. Acute respiratory failure ruled out. Patient with chronically on 6 L  oxygen, which does not appear to be worse. Patient has increased heart rate, severe leukocytosis, elevated lactic acid level at 3.0.  Patient meets severe sepsis criteria, this is secondary to aspiration pneumonia Reviewed patient CT scan results, independently reviewed CT images.  Patient has bilateral lower lobe infiltrates, consistent with aspiration pneumonia. Discussed with speech therapy, patient previously had a modified barium swallow, significant aspiration.  Patient was placed on nectar thick liquid, patient does not compliant with it, which has caused recurrent aspiration pneumonia. Discussed with the patient and the family, aspiration risk will unlikely to improve, long-term prognosis is poor. Will continue antibiotics. Patient family also requested pulmonology consult, discussed with Dr. Belia Heman.   Iron deficient anemia. Reactive thrombocytosis. Patient has no evidence of active bleeding.  Start iron supplement.  Chronic diastolic CHF (congestive heart failure) (HCC) Strict I/O Daily weight.  Evidence of alcohol withdrawal.   Hemiplegia and hemiparesis following cerebral infarction affecting left non-dominant side (HCC) Repeated MRI showed old stroke without acute changes.  Continue home medicines.   GERD (gastroesophageal reflux disease) PPI therapy.   Type 2 diabetes mellitus with other specified complication (HCC) Continue sliding scale insulin.  Infestation by bed bug Contact Isolation.   CODE STATUS. Noted that patient was DO NOT RESUSCITATE status in the past, discussed with patient and her husband, who is the POA, they decided patient should be a full code.     Subjective:  Patient complaining short of breath with exertion.  No cough.  Physical Exam: Vitals:   07/17/23 1230 07/17/23 1300 07/17/23 1330 07/17/23 1400  BP: 122/72 97/60 (!) 90/45 92/67  Pulse: 91 77 77 74  Resp:   16 13  Temp:   97.7  F (36.5 C)   TempSrc:   Oral   SpO2: 99% 94% 97% 97%   Weight:      Height:       General exam: Appears calm and comfortable  Respiratory system: Decreased breathing sounds. Respiratory effort normal. Cardiovascular system: S1 & S2 heard, RRR. No JVD, murmurs, rubs, gallops or clicks. No pedal edema. Gastrointestinal system: Abdomen is nondistended, soft and nontender. No organomegaly or masses felt. Normal bowel sounds heard. Central nervous system: Alert and oriented x2. No focal neurological deficits. Extremities: Symmetric 5 x 5 power. Skin: No rashes, lesions or ulcers Psychiatry: Judgement and insight appear normal. Mood & affect appropriate.    Data Reviewed:  Reviewed CT scan results, MRI of the brain, lab results.  Family Communication: Husband Updated at bedside.  Disposition: Status is: Inpatient Remains inpatient appropriate because: Severity of disease, IV treatment.     Time spent: 55 minutes  Author: Marrion Coy, MD 07/17/2023 2:08 PM  For on call review [email or web address].

## 2023-07-17 NOTE — Progress Notes (Signed)
Chap responded to Spiritual consult.. Pt, family and Nurse in the room. Introduced Spiritual Care to the patient who expressed a belief of a bigger purpose for her being alive till now. Chap supplied reflective listening and a prayer for divine intervention as requested by Pt.   07/17/23 1300  Spiritual Encounters  Type of Visit Initial  Care provided to: Pt and family  Referral source Nurse (RN/NT/LPN) [name], [name], RN)  Reason for visit Religious ritual  OnCall Visit No  Spiritual Framework  Presenting Themes Courage hope and growth;Values and beliefs  Community/Connection Family  Interventions  Spiritual Care Interventions Made Compassionate presence;Reflective listening;Prayer  Intervention Outcomes  Outcomes Reduced isolation;Awareness of support;Connection to spiritual care  Spiritual Care Plan  Spiritual Care Issues Still Outstanding No further spiritual care needs at this time (see row info)

## 2023-07-17 NOTE — Progress Notes (Signed)
*  PRELIMINARY RESULTS* Echocardiogram 2D Echocardiogram has been performed.  Carolyne Fiscal 07/17/2023, 1:04 PM

## 2023-07-17 NOTE — ED Notes (Signed)
Unit sec notified to arrange transport for the patient to the floor.

## 2023-07-17 NOTE — Hospital Course (Signed)
Erika [name] is a 52 y.o. female with medical history significant for heart disease, hypertension, chronic respiratory failure on 6 L at home, hypertension, obesity presenting from cardiology office episode of passing out.  Patient had history of stroke 2 years ago, has been having recurrent aspiration pneumonia for the last 6 months.  Modified barium swallow done last admission showed significant aspiration. Upon arriving the hospital, patient was found to have increased heart rate, significant cytosis with white cell 24.2, lactic acid 3.0.  Procalcitonin level 1.98.  Patient was placed on antibiotics for aspiration pneumonia.

## 2023-07-17 NOTE — TOC Initial Note (Signed)
Transition of Care [facility]) - Initial/Assessment Note    Patient Details  Name: Erika [name] MRN: [id number] Date of Birth: 10/18/1971  Transition of Care [facility]) CM/SW Contact:    Kreg Shropshire, RN Phone Number: 07/17/2023, 2:20 PM  Clinical Narrative:                  Cm assessed for TOC needs. Readmission prevention screening completed. Pt husband at bedside. She has walker, wheelchair, and home oxygen at home. No preference on HH. If pt has to go to SNF, they do not want to go to [facility]. She has pcp and get meds at pharmacy. Cm will continue to follow for needs.       Patient Goals and CMS Choice            Expected Discharge Plan and Services     Post Acute Care Choice: Durable Medical Equipment                     DME Agency: [employer]                  Prior Living Arrangements/Services                  Current home services: DME    Activities of Daily Living Home Assistive Devices/Equipment: CBG Meter, Walker (specify type) ADL Screening (condition at time of admission) Patient's cognitive ability adequate to safely complete daily activities?: Yes Is the patient deaf or have difficulty hearing?: No Does the patient have difficulty seeing, even when wearing glasses/contacts?: No Does the patient have difficulty concentrating, remembering, or making decisions?: No Patient able to express need for assistance with ADLs?: Yes Does the patient have difficulty dressing or bathing?: Yes Independently performs ADLs?: No Communication: Independent Dressing (OT): Needs assistance Is this a change from baseline?: Pre-admission baseline Grooming: Needs assistance Is this a change from baseline?: Pre-admission baseline Feeding: Independent Bathing: Needs assistance Is this a change from baseline?: Pre-admission baseline Toileting: Needs assistance Is this a change from baseline?: Pre-admission baseline In/Out Bed: Needs assistance Is this a  change from baseline?: Pre-admission baseline Walks in Home: Needs assistance Is this a change from baseline?: Pre-admission baseline Does the patient have difficulty walking or climbing stairs?: Yes Weakness of Legs: Left Weakness of Arms/Hands: Left  Permission Sought/Granted                  Emotional Assessment       Orientation: : Oriented to Self, Oriented to Place, Oriented to  Time, Oriented to Situation      Admission diagnosis:  Syncope and collapse [R55] Patient Active Problem List   Diagnosis Date Noted   Chronic hypotension 07/17/2023   Chronic hypoxemic respiratory failure (HCC) 07/17/2023   Hypokalemia 07/17/2023   Iron deficiency anemia 07/17/2023   Syncope and collapse 07/16/2023   Infestation by bed bug 07/16/2023   Aspiration pneumonia of both lungs (HCC) 07/12/2023   Anemia 07/12/2023   Orthostatic hypotension 07/12/2023   COPD with chronic bronchitis and emphysema (HCC) 07/01/2023   Community acquired pneumonia of right lower lobe of lung 06/13/2023   Hypoxia 06/13/2023   AKI (acute kidney injury) (HCC) 06/12/2023   Dyspnea 05/27/2023   Adventitious breath sounds 03/05/2023   Complex sleep apnea syndrome 10/03/2022   Scalp psoriasis 06/11/2022   Coccyx pain 06/11/2022   History of tobacco abuse 05/25/2022   Dermatitis 03/19/2022   Mild vascular dementia without behavioral disturbance, psychotic  disturbance, mood disturbance, or anxiety (HCC) 03/01/2022   Acute metabolic encephalopathy/delirium/polypharmacy 01/31/2022   Demand ischemia    Pressure injury of skin 01/27/2022   Cardiomegaly    Polypharmacy    COVID-19 virus infection 12/22/2021   Hyperglycemia 12/22/2021   Major depressive disorder, recurrent episode, mild (HCC) 11/26/2021   Ankle fracture, left    Closed avulsion fracture of medial malleolus of left tibia    Cerebrovascular accident (CVA) due to occlusion of vertebral artery (HCC) 09/11/2021   Cognitive change 09/11/2021    Cognitive communication deficit 08/10/2021   Urinary incontinence 08/10/2021   GERD (gastroesophageal reflux disease) 08/03/2021   Chronic post-traumatic stress disorder (PTSD)    Depression    Right pontine stroke (HCC) 06/28/2021   Hemiplegia and hemiparesis following cerebral infarction affecting left non-dominant side (HCC) 06/24/2021   Chronic diastolic CHF (congestive heart failure) (HCC) 06/24/2021   Fall 06/24/2021   Abnormal LFTs 06/24/2021   Olecranon bursitis of left elbow 06/12/2021   Chronic hip pain (Bilateral) 06/09/2021   Chronic use of opiate for therapeutic purpose 05/09/2021   Greater trochanteric bursitis of hip (Left) 03/09/2021   Bursitis of hip (Left) 02/23/2021   Uncomplicated opioid dependence (HCC) 02/20/2021   Acute conjunctivitis of left eye 01/02/2021   Unintentional weight loss 11/21/2020   Broken teeth (Right) 08/02/2020   Neuropathy 06/09/2020   Dental abscess 06/09/2020   Foot drop (Left) 06/09/2020   Carotid stenosis, asymptomatic, right 04/27/2020   Migraine, chronic, without aura 11/03/2019   Reactive thrombocytosis 09/16/2019   Erythrocytosis 09/16/2019   Hypercalcemia 09/06/2019   Leukocytosis 09/06/2019   Polycythemia 09/06/2019   Chronic hip pain (Left) 08/27/2019   Osteoarthritis of hip (Left) 08/27/2019   Gluteal tendonitis of buttock (Left) 08/27/2019   Radial nerve palsy (Right) 07/15/2019   Neuropathy of radial nerve (Right) 06/30/2019   Abnormal bruising 06/25/2019   History of carotid endarterectomy (Right) 03/04/2019   Pain medication agreement signed 03/04/2019   Atypical facial pain (Right) 01/27/2019   Chronic ear pain (Right) 01/27/2019   Chronic jaw pain (Right) 01/27/2019   Geniculate Neuralgia (Right) 01/27/2019   Vitamin D deficiency 01/19/2019   Neurogenic pain 01/19/2019   Chronic anticoagulation (PLAVIX) 01/19/2019   Chronic pain syndrome 01/07/2019   Long term current use of opiate analgesic 01/07/2019   Long  term prescription benzodiazepine use 01/07/2019   Pharmacologic therapy 01/07/2019   Disorder of skeletal system 01/07/2019   Problems influencing health status 01/07/2019   Chronic headaches (1ry area of Pain) (Right) 01/07/2019   Opiate use 09/24/2018   PVD (peripheral vascular disease) (HCC) 06/24/2018   Chronic ankle pain (Bilateral) 12/27/2017   Tendinopathy of gluteus medius (Right) 12/27/2017   Tendinopathy of gluteus medius (Left) 12/27/2017   Bilateral hip pain 12/26/2017   Chronic elbow pain (Left) 10/17/2017   Elevated troponin I level 05/21/2017   Severe sepsis (HCC) 05/19/2017   Major depressive disorder, recurrent episode, moderate (HCC) 11/19/2016   Insomnia 10/30/2016   Constipation 07/25/2016   S/P CABG x 4 07/06/2016   Bradycardia    Carotid stenosis    Malignant hypertension    Type 2 diabetes mellitus with other specified complication (HCC)    Chest pain with high risk for cardiac etiology 06/27/2016   NSTEMI (non-ST elevated myocardial infarction) (HCC) 06/27/2016   Proteinuria 03/15/2016   Renal artery stenosis (HCC) 03/15/2016   MDD (major depressive disorder) 10/17/2015   Agoraphobia with panic attacks 04/25/2015   Debility 04/02/2013   Cluster headache 03/20/2012  HLD (hyperlipidemia) 07/26/2010   PCP:  Eden Emms, NP Pharmacy:   [facility] [address] [facility] - [place], [place] - [address] AT [facility] [address] [facility] [facility] [address] [place] [address] Phone: [phone] Fax: [phone]     Social Determinants of Health (SDOH) Social History: SDOH Screenings   Food Insecurity: No Food Insecurity (07/17/2023)  Housing: Patient Unable To Answer (07/17/2023)  Transportation Needs: No Transportation Needs (07/17/2023)  Utilities: Not At Risk (07/17/2023)  Recent Concern: Utilities - At Risk (06/13/2023)  Depression (PHQ2-9): High Risk (07/12/2023)  Tobacco Use: Medium Risk (07/16/2023)   SDOH Interventions:     Readmission Risk  Interventions    07/17/2023    2:19 PM 12/24/2021   12:23 PM 11/23/2021    1:18 PM  Readmission Risk Prevention Plan  Transportation Screening Complete Complete Complete  PCP or Specialist Appt within 3-5 Days Complete    HRI or Home Care Consult Complete    Social Work Consult for Recovery Care Planning/Counseling Complete    Palliative Care Screening Complete    Medication Review Oceanographer) Referral to Pharmacy Complete Complete  PCP or Specialist appointment within 3-5 days of discharge  Complete Complete  HRI or Home Care Consult  Complete Complete  SW Recovery Care/Counseling Consult   Complete  Palliative Care Screening   Not Applicable  Skilled Nursing Facility   Complete

## 2023-07-17 NOTE — Consult Note (Signed)
Consultation Note Date: 07/17/2023   Patient Name: Erika [name]  DOB: May 28, 1971  MRN: [id number]  Age / Sex: 52 y.o., female  PCP: Eden Emms, NP Referring Physician: Marrion Coy, MD  Reason for Consultation: {Reason for Consult:23484}  HPI/Patient Profile: 52 y.o. female  with past medical history of *** admitted on 07/16/2023 with ***.   Clinical Assessment and Goals of Care: ***  Primary Decision Maker {Primary Decision WUJWJ:19147}    SUMMARY OF RECOMMENDATIONS   ***  Code Status/Advance Care Planning: {Palliative Code status:23503}   Symptom Management:  ***  Palliative Prophylaxis:  {Palliative Prophylaxis:21015}  Additional Recommendations (Limitations, Scope, Preferences): {Recommended Scope and Preferences:21019}  Psycho-social/Spiritual:  Desire for further Chaplaincy support:{YES NO:22349} Additional Recommendations: {PAL SOCIAL:21064}  Prognosis:  {Palliative Care Prognosis:23504}  Discharge Planning: {Palliative dispostion:23505}      Primary Diagnoses: Present on Admission: . Orthostatic hypotension . Anemia . Aspiration pneumonia of both lungs (HCC) . (Resolved) Reflux gastritis . GERD (gastroesophageal reflux disease) . Type 2 diabetes mellitus with other specified complication (HCC) . Chronic diastolic CHF (congestive heart failure) (HCC) . Syncope and collapse . Infestation by bed bug . Severe sepsis (HCC) . Reactive thrombocytosis   I have reviewed the medical record, interviewed the patient and family, and examined the patient. The following aspects are pertinent.  Past Medical History:  Diagnosis Date  . Acute respiratory failure with hypoxia (HCC) 06/24/2021  . Arthralgia of temporomandibular joint   . CAD, multiple vessel    a. 06/2016 Cath: ostLM 40%, ostLAD 40%, pLAD 95%, ost-pLCx 60%, pLCx 95%, mLCx 60%, mRCA 95%, D2 50%, LVSF nl;  b. 07/2016 CABG x 4 (LIMA->LAD,  VG->Diag, VG->OM, VG->RCA); c. 08/2016 Cath: 3VD w/ 4/4 patent grafts. LAD distal to LIMA has diff dzs->Med rx; d. 08/2020 Cath: 4/4 patent grafts, native 3VD. EF 55-65%-->Med Rx.  . Carotid arterial disease (HCC)    a. 07/2016 s/p R CEA; b. 02/2021 U/S: RICA 40-59%, LICA 1-39%.  . Cerebrovascular disease   . Clotting disorder (HCC)   . Complex sleep apnea syndrome 10/03/2022  . Depression   . Diastolic dysfunction    a. 06/2016 Echo: EF 50-55%, mild inf wall HK, GR1DD, mild MR, RV sys fxn nl, mildly dilated LA, PASP nl; b. 06/2021 Echo: EF 60-65%, no rwma. Nl RV fxn.  . Essential hypertension   . Essential hypertension, malignant 06/28/2016  . Fatty liver disease, nonalcoholic 2016  . History of blood transfusion    with heart surgery  . History of MI (myocardial infarction) (July 2017) 08/02/2020  . HLD (hyperlipidemia)   . HTN (hypertension), malignant 10/20/2013  . Labile hypertension    a. prior renal ngiogram negative for RAS in 03/2016; b. catecholamines and metanephrines normal, mildly elevated renin with normal aldosterone and normal ratio in 02/2016  . Mild tobacco abuse in early remission 06/28/2016  . Myocardial infarction (HCC) 2017  . Obesity   . PAD (peripheral artery disease) (HCC)    a. 09/2018 s/p L SFA stenting; b. 07/2019 Periph Angio: Patent m/d L SFA stent w/ 100% L SFA distal to stent. L AT 100d, L Peroneal diff dzs-->Med Rx; c. 02/2021 ABIs: stable @ 0.61 on R and 0.46 on L.  Marland Kitchen PTSD (post-traumatic stress disorder)   . Reflux gastritis 07/01/2023  . Stroke (HCC)   . Tobacco abuse   . Type 2 diabetes mellitus (HCC) 12/2015   Social History   Socioeconomic History  . Marital status: Married    Spouse name: Not  on file  . Number of children: 1  . Years of education: 30  . Highest education level: Not on file  Occupational History  . Occupation: Disability  Tobacco Use  . Smoking status: Former    Current packs/day: 0.00    Average packs/day: 0.5 packs/day for  30.0 years (15.0 ttl pk-yrs)    Types: Cigarettes    Start date: 76    Quit date: 2022    Years since quitting: 2.5    Passive exposure: Never  . Smokeless tobacco: Never  Vaping Use  . Vaping status: Every Day  . Substances: Flavoring  Substance and Sexual Activity  . Alcohol use: Not Currently    Alcohol/week: 0.0 standard drinks of alcohol    Comment: socially  . Drug use: No  . Sexual activity: Yes    Partners: Male    Birth control/protection: None  Other Topics Concern  . Not on file  Social History Narrative   11/21/20   From: [name]   Living: with husband, Susy Frizzle (2009) and special needs daughter    Work: disability       Family: daughter - [name] (special needs, lives with her) and 2 grown step children - [name] and [name] Client       Enjoys: play with dogs - 2 german Shepard, mut, and blue tick walker mix      Exercise: PAD limits exercise   Diet: diabetic diet and low potassium due to daughter      Safety   Seat belts: Yes    Guns: Yes  and secure   Safe in relationships: Yes    Social Determinants of Health   Financial Resource Strain: Not on file  Food Insecurity: No Food Insecurity (07/17/2023)   Hunger Vital Sign   . Worried About Programme researcher, broadcasting/film/video in the Last Year: Never true   . Ran Out of Food in the Last Year: Never true  Transportation Needs: No Transportation Needs (07/17/2023)   PRAPARE - Transportation   . Lack of Transportation (Medical): No   . Lack of Transportation (Non-Medical): No  Physical Activity: Not on file  Stress: Not on file  Social Connections: Not on file   Family History  Adopted: Yes  Problem Relation Age of Onset  . Diabetes Mother   . Diabetes Father   . Alcohol abuse Father   . Heart disease Father   . Drug abuse Father   . Stroke Sister   . Anxiety disorder Sister    Scheduled Meds: . arformoterol  15 mcg Nebulization BID   And  . umeclidinium bromide  1 puff Inhalation Daily  . budesonide  (PULMICORT) nebulizer solution  0.5 mg Nebulization BID  . chlorproMAZINE  75 mg Oral QHS  . clopidogrel  75 mg Oral Daily  . escitalopram  20 mg Oral Daily  . fludrocortisone  0.1 mg Oral Daily  . heparin  5,000 Units Subcutaneous Q8H  . ipratropium-albuterol  3 mL Nebulization Q4H  . iron polysaccharides  150 mg Oral Daily  . lamoTRIgine  225 mg Oral QHS  . methylPREDNISolone (SOLU-MEDROL) injection  20 mg Intravenous Q12H  . midodrine  5 mg Oral TID WC  . pregabalin  225 mg Oral BID  . topiramate  25 mg Oral QHS   Continuous Infusions: . cefTRIAXone (ROCEPHIN)  IV Stopped (07/17/23 1308)   PRN Meds:.acetaminophen, albuterol, oxyCODONE No Active Allergies Review of Systems  Physical Exam  Vital Signs: BP 92/67   Pulse 74  Temp 97.7 F (36.5 C) (Oral)   Resp 13   Ht 5\' 6"  (1.676 m)   Wt 82.1 kg   SpO2 97%   BMI 29.21 kg/m      Pain Score: 4    SpO2: SpO2: 97 % O2 Device:SpO2: 97 % O2 Flow Rate: .O2 Flow Rate (L/min): 6 L/min  IO: Intake/output summary:  Intake/Output Summary (Last 24 hours) at 07/17/2023 1556 Last data filed at 07/17/2023 0212 Gross per 24 hour  Intake 250 ml  Output --  Net 250 ml    LBM:   Baseline Weight: Weight: 82.1 kg Most recent weight: Weight: 82.1 kg     Palliative Assessment/Data:     *Please note that this is a verbal dictation therefore any spelling or grammatical errors are due to the "Dragon Medical One" system interpretation.  Time In: *** Time Out: *** Time Total: *** Greater than 50%  of this time was spent counseling and coordinating care related to the above assessment and plan.  Gerlean Ren, DNP, AGNP-C Palliative Medicine Team [phone] Pager: [phone]

## 2023-07-17 NOTE — Progress Notes (Signed)
CPAP:  Pt refused cpap therapy, stated does not have osa (states former diagnosis was rescinded) and that she does not tol a cpap mask well at all. Pt stated with prev attempts she awakes and removes mask during the night. Pt also states her pulm MD advised her she just needs supp 02 vs. cpap therapy. Pt found on 4 lpm [place].

## 2023-07-17 NOTE — Progress Notes (Signed)
Patient would qualify for NIV therapy if agreeable on discharge/or at follow-up. THANKS

## 2023-07-17 NOTE — Evaluation (Addendum)
Clinical/Bedside Swallow Evaluation Patient Details  Name: Erika [name] MRN: [id number] Date of Birth: 01-12-1971  Today's Date: 07/17/2023 Time: SLP Start Time (ACUTE ONLY): 0930 SLP Stop Time (ACUTE ONLY): 1030 SLP Time Calculation (min) (ACUTE ONLY): 60 min  Past Medical History:  Past Medical History:  Diagnosis Date   Acute respiratory failure with hypoxia (HCC) 06/24/2021   Arthralgia of temporomandibular joint    CAD, multiple vessel    a. 06/2016 Cath: ostLM 40%, ostLAD 40%, pLAD 95%, ost-pLCx 60%, pLCx 95%, mLCx 60%, mRCA 95%, D2 50%, LVSF nl;  b. 07/2016 CABG x 4 (LIMA->LAD, VG->Diag, VG->OM, VG->RCA); c. 08/2016 Cath: 3VD w/ 4/4 patent grafts. LAD distal to LIMA has diff dzs->Med rx; d. 08/2020 Cath: 4/4 patent grafts, native 3VD. EF 55-65%-->Med Rx.   Carotid arterial disease (HCC)    a. 07/2016 s/p R CEA; b. 02/2021 U/S: RICA 40-59%, LICA 1-39%.   Cerebrovascular disease    Clotting disorder (HCC)    Complex sleep apnea syndrome 10/03/2022   Depression    Diastolic dysfunction    a. 06/2016 Echo: EF 50-55%, mild inf wall HK, GR1DD, mild MR, RV sys fxn nl, mildly dilated LA, PASP nl; b. 06/2021 Echo: EF 60-65%, no rwma. Nl RV fxn.   Essential hypertension    Essential hypertension, malignant 06/28/2016   Fatty liver disease, nonalcoholic 2016   History of blood transfusion    with heart surgery   History of MI (myocardial infarction) (July 2017) 08/02/2020   HLD (hyperlipidemia)    HTN (hypertension), malignant 10/20/2013   Labile hypertension    a. prior renal ngiogram negative for RAS in 03/2016; b. catecholamines and metanephrines normal, mildly elevated renin with normal aldosterone and normal ratio in 02/2016   Mild tobacco abuse in early remission 06/28/2016   Myocardial infarction [facility]) 2017   Obesity    PAD (peripheral artery disease) (HCC)    a. 09/2018 s/p L SFA stenting; b. 07/2019 Periph Angio: Patent m/d L SFA stent w/ 100% L SFA distal to stent. L AT 100d, L  Peroneal diff dzs-->Med Rx; c. 02/2021 ABIs: stable @ 0.61 on R and 0.46 on L.   PTSD (post-traumatic stress disorder)    Reflux gastritis 07/01/2023   Stroke (HCC)    Tobacco abuse    Type 2 diabetes mellitus (HCC) 12/2015   Past Surgical History:  Past Surgical History:  Procedure Laterality Date   ABDOMINAL AORTOGRAM W/LOWER EXTREMITY [name] 10/15/2018   Procedure: ABDOMINAL AORTOGRAM W/LOWER EXTREMITY;  Surgeon: Iran Ouch, MD;  Location: [facility];  Service: Cardiovascular;  Laterality: [name];   ABDOMINAL AORTOGRAM W/LOWER EXTREMITY Bilateral 08/19/2019   Procedure: ABDOMINAL AORTOGRAM W/LOWER EXTREMITY;  Surgeon: Iran Ouch, MD;  Location: [facility];  Service: Cardiovascular;  Laterality: Bilateral;   CARDIAC CATHETERIZATION [name] 06/29/2016   Procedure: Left Heart Cath and Coronary Angiography;  Surgeon: Antonieta Iba, MD;  Location: [facility];  Service: Cardiovascular;  Laterality: [name];   CARDIAC CATHETERIZATION [name] 08/29/2016   Procedure: Left Heart Cath and Cors/Grafts Angiography;  Surgeon: Iran Ouch, MD;  Location: [facility];  Service: Cardiovascular;  Laterality: [name];   CESAREAN SECTION     CHOLECYSTECTOMY     CORONARY ARTERY BYPASS GRAFT [name] 07/06/2016   Procedure: CORONARY ARTERY BYPASS GRAFTING (CABG) x four, using left internal mammary artery and right leg greater saphenous vein harvested endoscopically;  Surgeon: Kerin Perna, MD;  Location: [facility] OR;  Service: Open  Heart Surgery;  Laterality: [name];   ENDARTERECTOMY Right 07/06/2016   Procedure: ENDARTERECTOMY CAROTID;  Surgeon: Larina Earthly, MD;  Location: [facility] OR;  Service: Vascular;  Laterality: Right;   ENDARTERECTOMY Right 04/27/2020   Procedure: REDO OF RIGHT ENDARTERECTOMY CAROTID;  Surgeon: Larina Earthly, MD;  Location: [facility] OR;  Service: Vascular;  Laterality: Right;   LEFT HEART CATH AND CORS/GRAFTS ANGIOGRAPHY [name] 08/24/2020   Procedure: LEFT HEART CATH AND CORS/GRAFTS  ANGIOGRAPHY;  Surgeon: Iran Ouch, MD;  Location: [facility];  Service: Cardiovascular;  Laterality: [name];   LEFT HEART CATH AND CORS/GRAFTS ANGIOGRAPHY [name] 01/29/2022   Procedure: LEFT HEART CATH AND CORS/GRAFTS ANGIOGRAPHY;  Surgeon: Iran Ouch, MD;  Location: [facility];  Service: Cardiovascular;  Laterality: [name];   PERIPHERAL VASCULAR CATHETERIZATION [name] 04/18/2016   Procedure: Renal Angiography;  Surgeon: Iran Ouch, MD;  Location: [facility];  Service: Cardiovascular;  Laterality: [name];   PERIPHERAL VASCULAR INTERVENTION Left 10/15/2018   Procedure: PERIPHERAL VASCULAR INTERVENTION;  Surgeon: Iran Ouch, MD;  Location: [facility];  Service: Cardiovascular;  Laterality: Left;  Left superficial femoral   TEE WITHOUT CARDIOVERSION [name] 07/06/2016   Procedure: TRANSESOPHAGEAL ECHOCARDIOGRAM (TEE);  Surgeon: Kerin Perna, MD;  Location: [facility] OR;  Service: Open Heart Surgery;  Laterality: [name];   TONSILLECTOMY     HPI:  Per H&P: "[name] is a 52 y.o. female with medical history significant of multiple medical issues including Chronic Dysphagia s/p stroke/MBSSs, chronic respiratory failure on 6 L, multivessel CAD status post CABG, carotid artery disease, R CVA with chronic left-sided deficits and Dysphagia, Mild Vascular Dementia, hypertension, peripheral artery disease status post stenting, type 2 diabetes, hyperlipidemia, HFpEF complex deep apnea syndrome, Obesity presenting from cardiology office episode of passing out.  Patient was discharged on 12 July for chronic respiratory failure attributed aspiration pneumonia and was treated with Unasyn (did not discharge.  Patient's hypotension has been suspected to be from autonomic dysfunction patient started Florinef by cardiology today along with midodrine EKG changes for her syncopal episode and hypotension.   Pt w/ prior admission at the end of June for issues including sepsis and  aspiration pneumonia.  CT of the chest with bibasilar patchy consolidative opacities concerning for multifocal pneumonia. Also with patulous fluid-filled esophagus and minimal layering debris concerning for intermittent recurrent aspiration.".  Pt currently on 6L O2 (consistent with home setting) and a regular solids and thin liquids diet - she states she drinks from a "Stanley cup w/ Straw" at home in a recliner where she sits much of the day per her report.   Current Chest CT imaging: Bilateral lower lobe airspace opacities, increased since prior CT compatible with worsening multifocal pneumonia.  Stable patulous fluid-filled esophagus.     Assessment / Plan / Recommendation  Clinical Impression   Pt was seen for BSE today. She was just seen by this Service 7/11-11/2023 w/ MBSS revealing:  "Patient presents with moderate and chronic pharyngeal phase dysphagia w/ mild oral phase dysphagia. Similar was noted per MBSS 06/2021 s/p R CVA, w/ resulting L sided weakness.  Oral stage is characterized by inconsistnet lip closure w/ escape of thin liquids x2 and min increased bolus preparation and mastication time w/ solids. Bolus containment and anterior to posterior transit time was functional. Swallow initiation occurs at the level of the pyriform sinuses for thin liquids, spilling to the p.s. for nectar liquids, and the valleculae for remaining consistencies.  Pharyngeal stage is noted for functional tongue base retraction, adequate hyolaryngeal excursion, and adequate pharyngeal constriction. Epiglottic deflection is complete during timely pharyngeal swallows. HOWEVER, w/ thin liquid consistencies and the delay in pharyngeal swallow initiation, laryngeal penetration and aspiration(x1 trial) occurs d/t the decreased pharyngeal sensation and reduced timing of epiglottic inversion. This results in laryngeal penetration(to the cords x1) and aspiration(x1) w/ thins; coating along underneath side of the epiglottis w/  nectar liquids. Pt exhibited delayed sensation to the aspiration; no apparent sensation to the laryngeal penetration occurring. Instructed pt on using a throat/re-swallow to aid clearing of vestibule. No overt pharyngeal residue noted; pharyngeal stripping wave is complete. Amplitude/duration of cricopharyngeus opening is WFL. There is adequate/complete clearance through the Cervical esophagus.".  Pt given trials of Nectar liquids via Cup, purees/softened solids w/ no immediate, overt clinical s/s of aspiration noted. Pt did not care for the Nectar liquids stating they were "an acquired taste", then preferred her specialty coffee brought in by [facility] member. Discussed general aspiration precautions w/ any oral intake; Pills in Puree for safer swallowing (NO thin liquids w/ Pills).  In setting of pt's admission for suspect aspiration pneumonia, ST services recommended downgrading pt's Regular diet consistency to Dysphagia 3 w/ NECTAR liquids to help reduce risk for aspiration/aspiration pneumonia. Family member arrived at end of session; education given to pt/Family answering their questions re: being able to drink the specialty coffee, her elevated WBC -- also referred this discussion to MD.   Due to pt/family having questions about pt's repeated aspiration pneumonia/admits, recommend pt have f/u w/ MD and Palliative Care to discuss overall GOC in setting of Chronic deficits from prior R CVA, including oropharyngeal phase Dysphagia and repeated aspiration pneumonia dxs(per pt report, chart imaging, MD notes). ST services will f/u w/ patient education and toleration of newly modified diet consistency of Nectar liquids(prep, purchase, etc). MD/NSG updated; agreed. Pt agreed.  SLP Visit Diagnosis: Dysphagia, oropharyngeal phase (R13.12) (chronic Dysphagia; Dementia; Esophageal phase Dysmotility)    Aspiration Risk  Risk for inadequate nutrition/hydration;Mild aspiration risk;Moderate aspiration risk     Diet Recommendation   Nectar;Dysphagia 3 (mechanical soft); aspiration precautions; REFLUX precautions; tray setup and support at meals.   Medication Administration: Whole meds with puree (vs Crushed IF needed per NSG)    Other  Recommendations Recommended Consults: Consider GI evaluation;Consider esophageal assessment (Palliative Care consult for GOC; Dietician) Oral Care Recommendations: Oral care BID;Oral care before and after PO;Patient independent with oral care (setup support)    Recommendations for follow up therapy are one component of a multi-disciplinary discharge planning process, led by the attending physician.  Recommendations may be updated based on patient status, additional functional criteria and insurance authorization.  Follow up Recommendations Follow physician's recommendations for discharge plan and follow up therapies (TBD)      Assistance Recommended at Discharge  Intermittent d/t LUE weakness  Functional Status Assessment Patient has had a recent decline in their functional status and/or demonstrates limited ability to make significant improvements in function in a reasonable and predictable amount of time  Frequency and Duration  (TBD)   (TBD)       Prognosis Prognosis for improved oropharyngeal function: Guarded Barriers to Reach Goals: Time post onset;Severity of deficits;Behavior;Motivation Barriers/Prognosis Comment: Prior multiple R CVA w/ L sided weakness; Chronic Dysphagia; GERD; Vascular Dementia      Swallow Study   General Date of Onset: 07/16/23 HPI: Per H&P: "Erika [name] is a 52 y.o. female with medical history significant of multiple  medical issues including Chronic Dysphagia s/p stroke/MBSSs, chronic respiratory failure on 6 L, multivessel CAD status post CABG, carotid artery disease, R CVA with chronic left-sided deficits and Dysphagia, Mild vascular Dementia, hypertension, peripheral artery disease status post stenting, type 2 diabetes,  hyperlipidemia, HFpEF complex deep apnea syndrome, Obesity presenting from cardiology office episode of passing out.  Patient was discharged on 12 July for chronic respiratory failure attributed aspiration pneumonia and was treated with Unasyn (did not discharge.  Patient's hypotension has been suspected to be from autonomic dysfunction patient started Florinef by cardiology today along with midodrine EKG changes for her syncopal episode and hypotension.  Pt w/ prior admission at the end of June for issues including sepsis and aspiration pneumonia.  CT of the chest with bibasilar patchy consolidative opacities concerning for multifocal pneumonia. Also with patulous fluid-filled esophagus and minimal layering debris concerning for intermittent recurrent aspiration.".  Pt currently on 6L O2 (consistent with home setting) and a regular solids and thin liquids diet - she states she drinks from a "Stanley cup w/ Straw" at home in a recliner where she sits much of the day per her report.  CUrrent Chest CT imaging: Bilateral lower lobe airspace opacities, increased since prior CT  compatible with worsening multifocal pneumonia.     Stable patulous fluid-filled esophagus. Type of Study: Bedside Swallow Evaluation Previous Swallow Assessment: MBSS completed 06/2022 indicating oropharyngeal phase dysphagia w/ aspiration of thins noted then - strategies/precautions given to reduce risk; MBSS completed in 2022 in CIR following CVA revealing moderate oral and mild pharyngeal dysphagia. At that time. Diet Prior to this Study: Dysphagia 3 (mechanical soft);Mildly thick liquids (Level 2, nectar thick) (diet changed from Regular, thins by this SLP d/t Baseline, Chronic Dysphagia and admittance w/ suspected aspiration pneumona.) Temperature Spikes Noted: No (wbc 22.8) Respiratory Status: Nasal cannula (6L) History of Recent Intubation: No Behavior/Cognition: Alert;Cooperative;Pleasant mood;Requires cueing (slightly  impulsive) Oral Cavity Assessment: Within Functional Limits Oral Care Completed by SLP: Recent completion by staff Oral Cavity - Dentition: Dentures, top;Dentures, bottom Vision: Functional for self-feeding Self-Feeding Abilities: Able to feed self;Needs set up (LUE weakness) Patient Positioning: Upright in bed (EOB) Baseline Vocal Quality: Normal Volitional Cough: Strong Volitional Swallow: Able to elicit    Oral/Motor/Sensory Function Overall Oral Motor/Sensory Function: Generalized oral weakness (slight L sided labial-facial asymmetry - suspect Baseline)   Ice Chips Ice chips: Not tested   Thin Liquid Thin Liquid: Not tested    Nectar Thick Nectar Thick Liquid: Within functional limits Presentation: Self Fed;Cup (7 trials) Other Comments: pt did not seem to like the trials - stated it was "an aquired taste"   Honey Thick Honey Thick Liquid: Not tested   Puree Puree: Within functional limits Presentation: Spoon;Self Fed (4 trials)   Solid     Solid: Impaired Presentation: Self Fed;Spoon (2 trials) Oral Phase Impairments: Impaired mastication (min slower but grossly functional - suspect baseline per previous notes) Oral Phase Functional Implications: Impaired mastication (min) Pharyngeal Phase Impairments:  (none)        Jerilynn Som, MS, CCC-SLP Speech Language Pathologist Rehab Services; [facility] [phone] (ascom) Denisse Whitenack 07/17/2023,2:35 PM

## 2023-07-18 DIAGNOSIS — I5032 Chronic diastolic (congestive) heart failure: Secondary | ICD-10-CM | POA: Diagnosis not present

## 2023-07-18 DIAGNOSIS — J690 Pneumonitis due to inhalation of food and vomit: Secondary | ICD-10-CM | POA: Diagnosis not present

## 2023-07-18 DIAGNOSIS — R55 Syncope and collapse: Secondary | ICD-10-CM | POA: Diagnosis not present

## 2023-07-18 DIAGNOSIS — Z515 Encounter for palliative care: Secondary | ICD-10-CM | POA: Diagnosis not present

## 2023-07-18 DIAGNOSIS — Z7189 Other specified counseling: Secondary | ICD-10-CM | POA: Diagnosis not present

## 2023-07-18 DIAGNOSIS — J441 Chronic obstructive pulmonary disease with (acute) exacerbation: Secondary | ICD-10-CM | POA: Insufficient documentation

## 2023-07-18 LAB — CBC
HCT: 24.4 % — ABNORMAL LOW (ref 36.0–46.0)
Hemoglobin: 7.5 g/dL — ABNORMAL LOW (ref 12.0–15.0)
MCH: 21.1 pg — ABNORMAL LOW (ref 26.0–34.0)
MCHC: 30.7 g/dL (ref 30.0–36.0)
MCV: 68.5 fL — ABNORMAL LOW (ref 80.0–100.0)
Platelets: 513 10*3/uL — ABNORMAL HIGH (ref 150–400)
RBC: 3.56 MIL/uL — ABNORMAL LOW (ref 3.87–5.11)
RDW: 18.3 % — ABNORMAL HIGH (ref 11.5–15.5)
nRBC: 0 % (ref 0.0–0.2)

## 2023-07-18 LAB — BASIC METABOLIC PANEL
BUN: 8 mg/dL (ref 6–20)
CO2: 25 mmol/L (ref 22–32)
Creatinine, Ser: 0.56 mg/dL (ref 0.44–1.00)
GFR, Estimated: >60 mL/min (ref >60)
Sodium: 135 mmol/L (ref 135–145)

## 2023-07-18 LAB — MAGNESIUM: Magnesium: 2.1 mg/dL (ref 1.7–2.4)

## 2023-07-18 LAB — CULTURE, BLOOD (ROUTINE X 2): Culture: NO GROWTH

## 2023-07-18 LAB — HEMOGLOBIN: Hemoglobin: 7.3 g/dL — ABNORMAL LOW (ref 12.0–15.0)

## 2023-07-18 LAB — LAMOTRIGINE LEVEL: Lamotrigine Lvl: 6.2 ug/mL (ref 2.0–20.0)

## 2023-07-18 MED ORDER — SODIUM CHLORIDE 0.9 % IV SOLN
300.0000 mg | Freq: Once | INTRAVENOUS | Status: AC
  Administered 2023-07-18: 300 mg via INTRAVENOUS
  Filled 2023-07-18: qty 300

## 2023-07-18 NOTE — Progress Notes (Signed)
Daily Progress Note   Patient Name: Erika [name]       Date: 07/18/2023 DOB: July 15, 1971  Age: 52 y.o. MRN#: [id number] Attending Physician: Erika Coy, MD Primary Care Physician: Erika Emms, NP Admit Date: 07/16/2023  Reason for Consultation/Follow-up: Establishing goals of care  Subjective: Feeling much better today Now agreeing to wear CPAP Agreeing to try nectar liquids, comply with SLP recs Looking forward to going home, working with therapies, attempting to gain some strength back - feeling motivated Was able to walk to the bathroom last night with minimal assistance, feeling pleased with progress  Length of Stay: 2  Current Medications: Scheduled Meds:   arformoterol  15 mcg Nebulization BID   And   umeclidinium bromide  1 puff Inhalation Daily   budesonide (PULMICORT) nebulizer solution  0.5 mg Nebulization BID   chlorproMAZINE  75 mg Oral QHS   clopidogrel  75 mg Oral Daily   escitalopram  20 mg Oral Daily   fludrocortisone  0.1 mg Oral Daily   heparin  5,000 Units Subcutaneous Q8H   ipratropium-albuterol  3 mL Nebulization TID   iron polysaccharides  150 mg Oral Daily   lamoTRIgine  225 mg Oral QHS   methylPREDNISolone (SOLU-MEDROL) injection  20 mg Intravenous Q12H   pregabalin  225 mg Oral BID   topiramate  25 mg Oral QHS    Continuous Infusions:  cefTRIAXone (ROCEPHIN)  IV 2 g (07/18/23 0556)    PRN Meds: acetaminophen, albuterol, oxyCODONE  Physical Exam Constitutional:      General: She is not in acute distress. Pulmonary:     Effort: Pulmonary effort is normal.  Neurological:     Mental Status: She is alert and oriented to person, place, and time.  Psychiatric:        Mood and Affect: Mood normal.        Behavior: Behavior normal.     Comments:  Feeling motivated             Vital Signs: BP (!) 110/56 (BP Location: Right Arm)   Pulse 89   Temp 97.7 F (36.5 C)   Resp 20   Ht 5\' 6"  (1.676 m)   Wt 82.1 kg   SpO2 96%   BMI 29.21 kg/m  SpO2: SpO2: 96 % O2 Device: O2 Device: Nasal Cannula O2 Flow Rate: O2 Flow Rate (L/min): 4 L/min  Intake/output summary:  Intake/Output Summary (Last 24 hours) at 07/18/2023 1340 Last data filed at 07/18/2023 1031 Gross per 24 hour  Intake 0 ml  Output 200 ml  Net -200 ml   LBM: Last BM Date : 07/16/23 Baseline Weight: Weight: 82.1 kg Most recent weight: Weight: 82.1 kg       Palliative Assessment/Data: PPS 50%      Patient Active Problem List   Diagnosis Date Noted   COPD with acute exacerbation (HCC) 07/18/2023   Chronic hypotension 07/17/2023   Chronic hypoxemic respiratory failure (HCC) 07/17/2023   Hypokalemia 07/17/2023   Iron deficiency anemia 07/17/2023   Syncope and collapse 07/16/2023   Infestation by bed bug 07/16/2023   Aspiration pneumonia of both lungs (HCC) 07/12/2023   Anemia 07/12/2023   Orthostatic hypotension 07/12/2023  COPD with chronic bronchitis and emphysema (HCC) 07/01/2023   Community acquired pneumonia of right lower lobe of lung 06/13/2023   Hypoxia 06/13/2023   AKI (acute kidney injury) (HCC) 06/12/2023   Dyspnea 05/27/2023   Adventitious breath sounds 03/05/2023   Complex sleep apnea syndrome 10/03/2022   Scalp psoriasis 06/11/2022   Coccyx pain 06/11/2022   History of tobacco abuse 05/25/2022   Dermatitis 03/19/2022   Mild vascular dementia without behavioral disturbance, psychotic disturbance, mood disturbance, or anxiety (HCC) 03/01/2022   Acute metabolic encephalopathy/delirium/polypharmacy 01/31/2022   Demand ischemia    Pressure injury of skin 01/27/2022   Cardiomegaly    Polypharmacy    COVID-19 virus infection 12/22/2021   Hyperglycemia 12/22/2021   Major depressive disorder, recurrent episode, mild (HCC) 11/26/2021   Ankle  fracture, left    Closed avulsion fracture of medial malleolus of left tibia    Cerebrovascular accident (CVA) due to occlusion of vertebral artery (HCC) 09/11/2021   Cognitive change 09/11/2021   Cognitive communication deficit 08/10/2021   Urinary incontinence 08/10/2021   GERD (gastroesophageal reflux disease) 08/03/2021   Chronic post-traumatic stress disorder (PTSD)    Depression    Right pontine stroke (HCC) 06/28/2021   Hemiplegia and hemiparesis following cerebral infarction affecting left non-dominant side (HCC) 06/24/2021   Chronic diastolic CHF (congestive heart failure) (HCC) 06/24/2021   Fall 06/24/2021   Abnormal LFTs 06/24/2021   Olecranon bursitis of left elbow 06/12/2021   Chronic hip pain (Bilateral) 06/09/2021   Chronic use of opiate for therapeutic purpose 05/09/2021   Greater trochanteric bursitis of hip (Left) 03/09/2021   Bursitis of hip (Left) 02/23/2021   Uncomplicated opioid dependence (HCC) 02/20/2021   Acute conjunctivitis of left eye 01/02/2021   Unintentional weight loss 11/21/2020   Broken teeth (Right) 08/02/2020   Neuropathy 06/09/2020   Dental abscess 06/09/2020   Foot drop (Left) 06/09/2020   Carotid stenosis, asymptomatic, right 04/27/2020   Migraine, chronic, without aura 11/03/2019   Reactive thrombocytosis 09/16/2019   Erythrocytosis 09/16/2019   Hypercalcemia 09/06/2019   Leukocytosis 09/06/2019   Polycythemia 09/06/2019   Chronic hip pain (Left) 08/27/2019   Osteoarthritis of hip (Left) 08/27/2019   Gluteal tendonitis of buttock (Left) 08/27/2019   Radial nerve palsy (Right) 07/15/2019   Neuropathy of radial nerve (Right) 06/30/2019   Abnormal bruising 06/25/2019   History of carotid endarterectomy (Right) 03/04/2019   Pain medication agreement signed 03/04/2019   Atypical facial pain (Right) 01/27/2019   Chronic ear pain (Right) 01/27/2019   Chronic jaw pain (Right) 01/27/2019   Geniculate Neuralgia (Right) 01/27/2019   Vitamin D  deficiency 01/19/2019   Neurogenic pain 01/19/2019   Chronic anticoagulation (PLAVIX) 01/19/2019   Chronic pain syndrome 01/07/2019   Long term current use of opiate analgesic 01/07/2019   Long term prescription benzodiazepine use 01/07/2019   Pharmacologic therapy 01/07/2019   Disorder of skeletal system 01/07/2019   Problems influencing health status 01/07/2019   Chronic headaches (1ry area of Pain) (Right) 01/07/2019   Opiate use 09/24/2018   PVD (peripheral vascular disease) (HCC) 06/24/2018   Chronic ankle pain (Bilateral) 12/27/2017   Tendinopathy of gluteus medius (Right) 12/27/2017   Tendinopathy of gluteus medius (Left) 12/27/2017   Bilateral hip pain 12/26/2017   Chronic elbow pain (Left) 10/17/2017   Elevated troponin I level 05/21/2017   Severe sepsis (HCC) 05/19/2017   Major depressive disorder, recurrent episode, moderate (HCC) 11/19/2016   Insomnia 10/30/2016   Constipation 07/25/2016   S/P CABG x 4 07/06/2016  Bradycardia    Carotid stenosis    Malignant hypertension    Type 2 diabetes mellitus with other specified complication (HCC)    Chest pain with high risk for cardiac etiology 06/27/2016   NSTEMI (non-ST elevated myocardial infarction) (HCC) 06/27/2016   Proteinuria 03/15/2016   Renal artery stenosis (HCC) 03/15/2016   MDD (major depressive disorder) 10/17/2015   Agoraphobia with panic attacks 04/25/2015   Debility 04/02/2013   Cluster headache 03/20/2012   HLD (hyperlipidemia) 07/26/2010    Palliative Care Assessment & Plan   HPI: 52 y.o. female  with past medical history of heart disease, hypertension, chronic respiratory failure on 6 L at home, hypertension, aspiration pna, orthostatic hypotension, CHF, CVA, anddementia admitted on 07/16/2023 with aspiration pneumonia.  Patient recently discharged July 12 after treatment for aspiration pneumonia as well.  She had MBS done during that admission which revealed significant aspiration risk. PMT consulted  to discuss goals of care.   Assessment: Follow up today with patient and spouse. Erika [name] is feeling better today and feeling motivated to work with therapies, follow medical recommendations.  We discuss plan for cpap moving forward. We discuss continuing to work with speech therapy and follow recs in hopes to decrease risk of aspiration.   Erika [name] shares more today about decision for full code. She again speaks about wanting to donate kidneys to her daughter. She shares she had looked into living donor option in the past but was told she was not a candidate at that time which is why she has chosen organ donation at time of death.   Spouse asking about SCDs at home - asked TOC - gave spouse recs from [facility] on obtaining SCDs at home.   Confirmed with spouse current AD on file is accurate and up to date.   Recommendations/Plan: Continue full code/full scope care: Tessah is feeling motivated to work with therapies, hopeful to improve functional status Would accept 1-2 weeks of ventilator support, no longer Planning to dc home with home health, plan to follow closely with SLP Would like to avoid facility placement  Code Status: Full code  Discharge Planning: Home with Home Health  Care plan was discussed with patient and spouse  Thank you for allowing the Palliative Medicine Team to assist in the care of this patient.  *Please note that this is a verbal dictation therefore any spelling or grammatical errors are due to the "[employer]" system interpretation.  Gerlean Ren, DNP, [facility] Palliative Medicine Team Team Phone # [phone]  Pager [phone]

## 2023-07-18 NOTE — Progress Notes (Signed)
Progress Note   Patient: Erika [name] QQV:[id number] DOB: 12-20-1971 DOA: 07/16/2023     2 DOS: the patient was seen and examined on 07/18/2023   Brief hospital course: Erika [name] is a 52 y.o. female with medical history significant for heart disease, hypertension, chronic respiratory failure on 6 L at home, hypertension, obesity presenting from cardiology office episode of passing out.  Patient had history of stroke 2 years ago, has been having recurrent aspiration pneumonia for the last 6 months.  Modified barium swallow done last admission showed significant aspiration. Upon arriving the hospital, patient was found to have increased heart rate, significant cytosis with white cell 24.2, lactic acid 3.0.  Procalcitonin level 1.98.  Patient was placed on antibiotics for aspiration pneumonia.   Principal Problem:   Syncope and collapse Active Problems:   Aspiration pneumonia of both lungs (HCC)   Orthostatic hypotension   Anemia   Chronic diastolic CHF (congestive heart failure) (HCC)   Hemiplegia and hemiparesis following cerebral infarction affecting left non-dominant side (HCC)   Type 2 diabetes mellitus with other specified complication (HCC)   GERD (gastroesophageal reflux disease)   Severe sepsis (HCC)   Reactive thrombocytosis   Infestation by bed bug   Chronic hypotension   Chronic hypoxemic respiratory failure (HCC)   Hypokalemia   Iron deficiency anemia   COPD with acute exacerbation (HCC)   Assessment and Plan: * Syncope and collapse Chronic Orthostatic hypotension Patient has a chronic hypotension, was followed by cardiology, currently treated with midodrine as well as Florinef. Syncope will appear to be secondary to also to hypotension in addition to pneumonia. Blood pressure is running higher, discontinue midodrine.     Aspiration pneumonia of both lungs (HCC) Severe sepsis secondary to aspiration pneumonia. Chronic hypoxemic respiratory  failure. Acute respiratory failure ruled out. COPD exacerbation. Patient with chronically on 6 L oxygen, which does not appear to be worse. Patient has increased heart rate, severe leukocytosis, elevated lactic acid level at 3.0.  Patient meets severe sepsis criteria, this is secondary to aspiration pneumonia Reviewed patient CT scan results, independently reviewed CT images.  Patient has bilateral lower lobe infiltrates, consistent with aspiration pneumonia. Discussed with speech therapy, patient previously had a modified barium swallow, significant aspiration.  Patient was placed on nectar thick liquid, patient does not compliant with it, which has caused recurrent aspiration pneumonia. Discussed with the patient and the family, aspiration risk will unlikely to improve, long-term prognosis is poor. Will continue antibiotics. Patient now is a compliant with nectar thickened liquid.  She feels much better, patient is also seen by pulmonology, started IV steroids.     Iron deficient anemia. Reactive thrombocytosis. Hemoglobin dropped down to 7.5, still has no evidence of active bleeding.  Give IV iron, monitor hemoglobin every 12 hours, transfuse as needed.   Chronic diastolic CHF (congestive heart failure) (HCC) Strict I/O Daily weight.  No evidence of exacerbation.     Hemiplegia and hemiparesis following cerebral infarction affecting left non-dominant side (HCC) Repeated MRI showed old stroke without acute changes.  Continue home medicines.     GERD (gastroesophageal reflux disease) PPI therapy.    Type 2 diabetes mellitus with other specified complication (HCC) Continue sliding scale insulin.   Infestation by bed bug Patient denies ever had a bedbug at home.  May discontinue isolation tomorrow if no additional bugs found.      Subjective:  Patient doing better today, currently on 4 L oxygen, no significant short of breath.  Physical  Exam: Vitals:   07/17/23 2031  07/17/23 2357 07/18/23 0608 07/18/23 0805  BP:  (!) 113/59 127/68 (!) 142/73  Pulse:  92  79  Resp:  18 18 18   Temp:  98.2 F (36.8 C) 97.9 F (36.6 C) 97.8 F (36.6 C)  TempSrc:  Oral Oral   SpO2: 97% 95% 99% 99%  Weight:      Height:       General exam: Appears calm and comfortable  Respiratory system: Significant decreased breathing sounds.  Respiratory effort normal. Cardiovascular system: S1 & S2 heard, RRR. No JVD, murmurs, rubs, gallops or clicks. No pedal edema. Gastrointestinal system: Abdomen is nondistended, soft and nontender. No organomegaly or masses felt. Normal bowel sounds heard. Central nervous system: Alert and oriented.  Left-sided weakness. Extremities: Symmetric 5 x 5 power. Skin: No rashes, lesions or ulcers Psychiatry: Judgement and insight appear normal. Mood & affect appropriate.    Data Reviewed:  Lab results reviewed.  Family Communication: Mother-in-law updated at bedside.  Disposition: Status is: Inpatient Remains inpatient appropriate because: Severity of disease, IV treatment.     Time spent: 35 minutes  Author: Marrion Coy, MD 07/18/2023 11:55 AM  For on call review [email or web address].

## 2023-07-18 NOTE — Plan of Care (Signed)

## 2023-07-18 NOTE — Evaluation (Signed)
Occupational Therapy Evaluation Patient Details Name: Erika [name] MRN: [id number] DOB: 06/25/1971 Today's Date: 07/18/2023   History of Present Illness Erika [name] is a 51yoF who comes to [facility] on 7/23 via outpatient provider concerns of PNA relapse. Pt recently admitted with PNA 7/10-7/12, c PNA 6/19-6/21. Pt lives at home with husband and adult, childrne dogs, 2 year history of CVA c left hemiplegia, had progressed to limited household distances with Left HW (AFO when outside), but has been having persistent issues with orthostatic dizziness/syncope since June 2024, minimal improvements with medication regimen, essentially nonAMB since, using WC more at home. Per husband, has not seen HHPT since these admissions (finally got referral from PCP on 7/19, but readmitted prior to starting). Husband reports pt has required significant assit with transfers recently. Pt sleeps in recliner at home now with [facility] >25 degrees, uses 4L O2, recently increased due to PNA x3.Saw PCP on 7/19 1 week post DC, noted PNA improving, home health referral made, orders to continued midodrine 5mg  TID. Seen outpatient cardiology with Dr. Kirke Corin 7/23 for ongoing management of recurrent syncope/orthostatic hypotension, recommended addition of florinef and checking TSH. Active problems include bilat SFA occlusion c claudication, carotid disease s/p successful endarterectomy, CAD s/p cath 2023, iron deficiency anemia.   Clinical Impression   Patient presenting with decreased Ind in self care,balance, functional mobility/transfers, endurance, and safety awareness. Patient reports living at home with husband. She utilizes hemi walker for short distance ambulation at home. Multiple falls recently secondary to orthostatic hypotension for ~ 2 months now. Pt needing assistance for safety for shower transfers from husband.  Patient currently functioning at min A for bed mobility (sleeps in lift chair at home) and min guard for  stand and step pivot to recliner chair with use of hemi walker. Set up A for grooming tasks. Patient will benefit from acute OT to increase overall independence in the areas of ADLs, functional mobility, and safety awareness in order to safely discharge.     Recommendations for follow up therapy are one component of a multi-disciplinary discharge planning process, led by the attending physician.  Recommendations may be updated based on patient status, additional functional criteria and insurance authorization.   Assistance Recommended at Discharge Intermittent Supervision/Assistance  Patient can return home with the following A lot of help with walking and/or transfers;A little help with bathing/dressing/bathroom;Assistance with cooking/housework;Assist for transportation;Help with stairs or ramp for entrance    Functional Status Assessment  Patient has had a recent decline in their functional status and demonstrates the ability to make significant improvements in function in a reasonable and predictable amount of time.  Equipment Recommendations  None recommended by OT       Precautions / Restrictions Precautions Precautions: Fall Precaution Comments: baseline orthostatics BP, time sessiosn with midodrine dosing Restrictions Weight Bearing Restrictions: No LUE Weight Bearing: Non weight bearing      Mobility Bed Mobility Overal bed mobility: Needs Assistance Bed Mobility: Supine to Sit     Supine to sit: Min assist, HOB elevated     General bed mobility comments: min A for trunk support    Transfers Overall transfer level: Needs assistance Equipment used: Hemi-walker Transfers: Sit to/from Stand, Bed to chair/wheelchair/BSC Sit to Stand: Min guard     Step pivot transfers: Min guard            Balance Overall balance assessment: Modified Independent, History of Falls Sitting-balance support: No upper extremity supported, Feet supported Sitting balance-Leahy Scale:  Good  Standing balance support: Single extremity supported, Reliant on assistive device for balance Standing balance-Leahy Scale: Poor                             ADL either performed or assessed with clinical judgement   ADL Overall ADL's : Needs assistance/impaired                         Toilet Transfer: Min Agricultural consultant Details (indicate cue type and reason): hemi walker simulated                 Vision Patient Visual Report: No change from baseline              Pertinent Vitals/Pain Pain Assessment Pain Assessment: No/denies pain     Hand Dominance Right   Extremity/Trunk Assessment Upper Extremity Assessment Upper Extremity Assessment: LUE deficits/detail LUE Deficits / Details: History of CVA affecting LUE LUE Coordination: decreased fine motor;decreased gross motor           Communication Communication Communication: No difficulties   Cognition Arousal/Alertness: Awake/alert Behavior During Therapy: WFL for tasks assessed/performed Overall Cognitive Status: History of cognitive impairments - at baseline                                 General Comments: Pt is pleasant and cooperative overall during session                Home Living Family/patient expects to be discharged to:: Private residence Living Arrangements: Spouse/significant other Available Help at Discharge: Family;Available 24 hours/day Type of Home: House Home Access: Ramped entrance     Home Layout: One level     Bathroom Shower/Tub: Producer, television/film/video: Standard     Home Equipment: Grab bars - tub/shower;Shower seat - built in;Other (comment);Wheelchair - manual   Additional Comments: Pt typically uses 4L home O2 chronic but has used 6L for past week.      Prior Functioning/Environment Prior Level of Function : History of Falls (last six months)             Mobility Comments: had  progressed to limited household distances with HW (room to room), but unable x several weeks now ADLs Comments: Pt reports husband assists her for functional transfers for bathing needs and dressing as needed.        OT Problem List: Decreased activity tolerance;Impaired balance (sitting and/or standing)      OT Treatment/Interventions: Self-care/ADL training;Therapeutic exercise;Energy conservation;Therapeutic activities;Patient/family education;Balance training    OT Goals(Current goals can be found in the care plan section) Acute Rehab OT Goals Patient Stated Goal: to feel better and return home OT Goal Formulation: With patient Time For Goal Achievement: 08/01/23 Potential to Achieve Goals: Fair ADL Goals Pt Will Perform Grooming: sitting;with supervision Pt Will Perform Lower Body Dressing: with min guard assist;sit to/from stand Pt Will Transfer to Toilet: with supervision;ambulating Pt Will Perform Toileting - Clothing Manipulation and hygiene: with supervision;sit to/from stand  OT Frequency: Min 1X/week       AM-PAC OT "6 Clicks" Daily Activity     Outcome Measure Help from another person eating meals?: None Help from another person taking care of personal grooming?: A Little Help from another person toileting, which includes using toliet, bedpan, or urinal?: A Lot Help from another person bathing (including washing,  rinsing, drying)?: A Little Help from another person to put on and taking off regular upper body clothing?: None Help from another person to put on and taking off regular lower body clothing?: A Little 6 Click Score: 19   End of Session Equipment Utilized During Treatment: Oxygen (6Ls) Nurse Communication: Mobility status  Activity Tolerance: Patient tolerated treatment well Patient left: in chair;with bed alarm set;with chair alarm set  OT Visit Diagnosis: Unsteadiness on feet (R26.81);Other abnormalities of gait and mobility (R26.89);Dizziness and  giddiness (R42)                Time: 5409-8119 OT Time Calculation (min): 14 min Charges:  OT General Charges $OT Visit: 1 Visit OT Evaluation $OT Eval Moderate Complexity: 1 Mod OT Treatments $Self Care/Home Management : 8-22 mins  Jackquline Denmark, MS, OTR/L , CBIS ascom [phone]  07/18/23, 2:11 PM

## 2023-07-18 NOTE — Evaluation (Signed)
Physical Therapy Evaluation Patient Details Name: Erika [name] MRN: [id number] DOB: 1971-06-25 Today's Date: 07/18/2023  History of Present Illness  Erika [name] is a 51yoF who comes to [facility] on 7/23 via outpatient provider concerns of PNA relapse. Pt recently admitted with PNA 7/10-7/12, c PNA 6/19-6/21. Pt lives at home with husband and adult, childrne dogs, 2 year history of CVA c left hemiplegia, had progressed to limited household distances with Left HW (AFO when outside), but has been having persistent issues with orthostatic dizziness/syncope since June 2024, minimal improvements with medication regimen, essentially nonAMB since, using WC more at home. Per husband, has not seen HHPT since these admissions (finally got referral from PCP on 7/19, but readmitted prior to starting). Husband reports pt has required significant assit with transfers recently. Pt sleeps in recliner at home now with [facility] >25 degrees, uses 4L O2, recently increased due to PNA x3.Saw PCP on 7/19 1 week post DC, noted PNA improving, home health referral made, orders to continued midodrine 5mg  TID. Seen outpatient cardiology with Dr. Kirke Corin 7/23 for ongoing management of recurrent syncope/orthostatic hypotension, recommended addition of florinef and checking TSH. Active problems include bilat SFA occlusion c claudication, carotid disease s/p successful endarterectomy, CAD s/p cath 2023, iron deficiency anemia.  Clinical Impression  Pt familiar to author from prior rehab services. Pt is presenting much better at time of eval. Pt seen post admission 10mg  midodrine. Husband demonstrates techniques for assisted bed mobility and transfers, AMB deferred at eval. Pt requires minA for these which is improved since immediately PTA, however pt is concerned that assessment is limited in value given pt's hard fluctuations in strength- suspect hypotension still playing a role, noted pressures in PCP office 5 days prior 90s/50s,  perhaps no longer causing frank dizziness, but still low enough to create partial hypotonia and weakness. Pt as not had a chance to start HHPT since these admission began. Med regimen also not meeting goals of orthostasis issues but are being address in outpatient providers. Discussed with husband/pt utility of hoyer lift to assist during weak days, but they have not decided whether a hospital bed will be in their favor at time of evaluation. Will continue to follow.        Assistance Recommended at Discharge Set up Supervision/Assistance  If plan is discharge home, recommend the following:  Can travel by private vehicle  A lot of help with walking and/or transfers;A little help with bathing/dressing/bathroom;Assistance with cooking/housework;Assist for transportation;Help with stairs or ramp for entrance        Equipment Recommendations Hospital bed (hoyer lift)  Recommendations for Other Services       Functional Status Assessment Patient has had a recent decline in their functional status and demonstrates the ability to make significant improvements in function in a reasonable and predictable amount of time.     Precautions / Restrictions Precautions Precautions: Fall Precaution Comments: baseline orthostatics BP, time sessiosn with midodrine dosing Restrictions Weight Bearing Restrictions: No      Mobility    07/17/23 1700  Bed Mobility  Overal bed mobility Needs Assistance  Bed Mobility Supine to Sit  Supine to sit Min assist;HOB elevated  General bed mobility comments husband demonstrates their technique  Transfers  Overall transfer level Needs assistance  Equipment used Hemi-walker  Transfers Sit to/from Stand;Bed to chair/wheelchair/BSC  Sit to Stand Min assist  Bed to/from chair/wheelchair/BSC transfer type: Step pivot  Step pivot transfers Min guard  General transfer comment Husband reports only minA  to stand and steady assist for pivot steps to recliner.  (strength improved today, but fluctuates)  Ambulation/Gait  Ambulation/Gait assistance  (deferrd at eval due to acuity of PNA/sepsis)       Balance Overall balance assessment: Modified Independent, History of Falls                                           Pertinent Vitals/Pain Pain Assessment Pain Assessment: No/denies pain    Home Living Family/patient expects to be discharged to:: Private residence Living Arrangements: Spouse/significant other Available Help at Discharge: Family;Available 24 hours/day Type of Home: House Home Access: Ramped entrance       Home Layout: One level Home Equipment: Grab bars - tub/shower;Shower seat - built in;Other (comment);Wheelchair - manual (hemi walker) Additional Comments: Pt typically uses 4L home O2 chronic but has used 6L for past week.    Prior Function Prior Level of Function : History of Falls (last six months)             Mobility Comments: had progressed to limited household distances with HW (room to room), but unable x several weeks now ADLs Comments: Assist in/out of shower and set up (pt typically stands to shower)     Hand Dominance   Dominant Hand: Right    Extremity/Trunk Assessment                Communication      Cognition Arousal/Alertness: Awake/alert Behavior During Therapy: WFL for tasks assessed/performed Overall Cognitive Status: History of cognitive impairments - at baseline                                          General Comments      Exercises     Assessment/Plan    PT Assessment Patient needs continued PT services  PT Problem List Decreased strength;Decreased activity tolerance;Decreased balance;Decreased mobility;Decreased knowledge of precautions;Cardiopulmonary status limiting activity       PT Treatment Interventions DME instruction;Gait training;Functional mobility training;Therapeutic activities;Therapeutic exercise;Balance  training;Patient/family education    PT Goals (Current goals can be found in the Care Plan section)  Acute Rehab PT Goals Patient Stated Goal: regain tolerance to upright, regain wlaking ability, avoid another admission PT Goal Formulation: With patient/family Time For Goal Achievement: 08/01/23 Potential to Achieve Goals: Fair    Frequency Min 1X/week     Co-evaluation               AM-PAC PT "6 Clicks" Mobility  Outcome Measure Help needed turning from your back to your side while in a flat bed without using bedrails?: A Lot Help needed moving from lying on your back to sitting on the side of a flat bed without using bedrails?: A Lot Help needed moving to and from a bed to a chair (including a wheelchair)?: A Lot Help needed standing up from a chair using your arms (e.g., wheelchair or bedside chair)?: A Lot Help needed to walk in hospital room?: Total Help needed climbing 3-5 steps with a railing? : Total 6 Click Score: 10    End of Session Equipment Utilized During Treatment: Oxygen Activity Tolerance: Patient tolerated treatment well;No increased pain (dizziness upon sitting x1 minute,s none with standing) Patient left: in chair;with call bell/phone within reach;with family/visitor present Nurse  Communication: Mobility status PT Visit Diagnosis: Other abnormalities of gait and mobility (R26.89);Muscle weakness (generalized) (M62.81);History of falling (Z91.81);Pain    Time: 1700-1720 PT Time Calculation (min) (ACUTE ONLY): 20 min   Charges:   PT Evaluation $PT Eval High Complexity: 1 High   PT General Charges $$ ACUTE PT VISIT: 1 Visit        7:45 AM, 07/18/23 Rosamaria Lints, PT, DPT Physical Therapist - [facility]  [phone] (ASCOM)    Meril Dray C 07/18/2023, 7:39 AM

## 2023-07-18 NOTE — TOC Progression Note (Signed)
Transition of Care [facility]) - Progression Note    Patient Details  Name: Erika [name] MRN: [id number] Date of Birth: 07-08-1971  [facility]) CM/SW Contact  Darolyn Rua, [place] Phone Number: 07/18/2023, 9:50 AM  Clinical Narrative:     CSW spoke with patient's husband [name] about home health services, he reports [facility] is currently coming out for speech so he would like to add PT and RN, aide. Will also add Child psychotherapist. He reports Isaias Cowman with PT discussed hoyer lift with him, he reports patient does not sleep in hospital bed and uses a recliner that she typically sits and sleeps in. He reports he will think about hoyer lift and let CSW know if they decide to move forward with that.   CSW has reached out to [place] with [facility] to inquire if can add PT RN aide and Child psychotherapist, pending response at this time.         Expected Discharge Plan and Services     Post Acute Care Choice: Durable Medical Equipment                     DME Agency: [employer]                   Social Determinants of Health (SDOH) Interventions SDOH Screenings   Food Insecurity: No Food Insecurity (07/17/2023)  Housing: Patient Unable To Answer (07/17/2023)  Transportation Needs: No Transportation Needs (07/17/2023)  Utilities: Not At Risk (07/17/2023)  Recent Concern: Utilities - At Risk (06/13/2023)  Depression (PHQ2-9): High Risk (07/12/2023)  Tobacco Use: Medium Risk (07/16/2023)    Readmission Risk Interventions    07/17/2023    2:19 PM 12/24/2021   12:23 PM 11/23/2021    1:18 PM  Readmission Risk Prevention Plan  Transportation Screening Complete Complete Complete  PCP or Specialist Appt within 3-5 Days Complete    HRI or Home Care Consult Complete    Social Work Consult for Recovery Care Planning/Counseling Complete    Palliative Care Screening Complete    Medication Review Oceanographer) Referral to Pharmacy Complete Complete  PCP or Specialist appointment within  3-5 days of discharge  Complete Complete  HRI or Home Care Consult  Complete Complete  SW Recovery Care/Counseling Consult   Complete  Palliative Care Screening   Not Applicable  Skilled Nursing Facility   Complete

## 2023-07-18 NOTE — Progress Notes (Addendum)
Speech Language Pathology Treatment: Dysphagia  Patient Details Name: Erika [name] MRN: [id number] DOB: 10/11/1971 Today's Date: 07/18/2023 Time: 8295-6213 SLP Time Calculation (min) (ACUTE ONLY): 45 min  Assessment / Plan / Recommendation Clinical Impression  Met w/ both pt and Husband in room this afternoon. Provided education on recommendations from both BSE yesterday and the MBSS results/recommendations from recent [facility] 7/12/12024 indicating needs for transition to Nectar consistency liquids. Pt A/Ox3(has dx'd Mild Vascular Dementia per chart, Husband).  Pt on [place] O2 support; afebrile.   Per recent MBSS, 07/05/2023: "Patient presents with moderate and chronic pharyngeal phase dysphagia w/ mild oral phase dysphagia. Similar was noted per MBSS 06/2021 s/p R CVA, w/ resulting L sided weakness.  Oral stage is characterized by inconsistnet lip closure w/ escape of thin liquids x2 and min increased bolus preparation and mastication time w/ solids. Bolus containment and anterior to posterior transit time was functional. Swallow initiation occurs at the level of the pyriform sinuses for thin liquids, spilling to the p.s. for nectar liquids, and the valleculae for remaining consistencies.  Pharyngeal stage is noted for functional tongue base retraction, adequate hyolaryngeal excursion, and adequate pharyngeal constriction. Epiglottic deflection is complete during timely pharyngeal swallows. HOWEVER, w/ thin liquid consistencies and the delay in pharyngeal swallow initiation, laryngeal penetration and aspiration(x1 trial) occurs d/t the decreased pharyngeal sensation and reduced timing of epiglottic inversion. This results in laryngeal penetration(to the cords x1) and aspiration(x1) w/ thins; coating along underneath side of the epiglottis w/ nectar liquids. Pt exhibited delayed sensation to the aspiration; no apparent sensation to the laryngeal penetration occurring. Instructed pt on using a  throat/re-swallow to aid clearing of vestibule. No overt pharyngeal residue noted; pharyngeal stripping wave is complete. Amplitude/duration of cricopharyngeus opening is WFL. There is adequate/complete clearance through the Cervical esophagus.".  Answered questions re: the results/recommendations moving forward to include Nectar liquids in pt's diet; BOTH pt and Husband agreed. Pt has been drinking the Nectar liquids since admit w/out reported overt clinical s/s of aspiration by both NSG staff, family.  Education provided on the Nectar consistency liquids including: prep and ordering of Nectar liquids for home use; provided thickener/model for mixing the Nectar consistency and it's comparison to a pre-thickened liquid; aspiration precautions; suggestions for managing liquids in foods(mixed consistencies) and food prep(thickened cream soups); handouts/supplies.  Discussed pt's views on having to drink thickened liquids in order to reduce risk for aspiration -- highlighted to pt and Husband that the thickened liquids will not prevent aspiration but can reduce risk; both agreed. Discussed importance of oral care prior to/after meals also.   Pt to follow up w/ PCP if any negative sequelae from suspected aspiration post D/C home. Pt/Husband had no further questions. MD/NSG updated.       HPI HPI: Per H&P: "Erika [name] is a 52 y.o. female with medical history significant of multiple medical issues including Chronic Dysphagia s/p stroke/MBSSs, chronic respiratory failure on 6 L, multivessel CAD status post CABG, carotid artery disease, R CVA with chronic left-sided deficits and Dysphagia, Mild vascular Dementia, hypertension, peripheral artery disease status post stenting, type 2 diabetes, hyperlipidemia, HFpEF complex deep apnea syndrome, Obesity presenting from cardiology office episode of passing out.  Patient was discharged on 12 July for chronic respiratory failure attributed aspiration pneumonia  and was treated with Unasyn (did not discharge.  Patient's hypotension has been suspected to be from autonomic dysfunction patient started Florinef by cardiology today along with midodrine EKG changes for her syncopal episode and  hypotension.  Pt w/ prior admission at the end of June for issues including sepsis and aspiration pneumonia.  CT of the chest with bibasilar patchy consolidative opacities concerning for multifocal pneumonia. Also with patulous fluid-filled esophagus and minimal layering debris concerning for intermittent recurrent aspiration.".  Pt currently on 6L O2 (consistent with home setting) and a regular solids and thin liquids diet - she states she drinks from a "Stanley cup w/ Straw" at home in a recliner where she sits much of the day per her report.  CUrrent Chest CT imaging: Bilateral lower lobe airspace opacities, increased since prior CT  compatible with worsening multifocal pneumonia.     Stable patulous fluid-filled esophagus.      SLP Plan  All goals met      Recommendations for follow up therapy are one component of a multi-disciplinary discharge planning process, led by the attending physician.  Recommendations may be updated based on patient status, additional functional criteria and insurance authorization.    Recommendations  Diet recommendations: Regular;Nectar-thick liquid (cut foods; small pieces) Liquids provided via: Cup;No straw Medication Administration: Whole meds with puree (baseline) Supervision: Patient able to self feed (setup; monitoring as needed) Compensations: Minimize environmental distractions;Slow rate;Small sips/bites;Lingual sweep for clearance of pocketing;Follow solids with liquid;Multiple dry swallows after each bite/sip;Clear throat intermittently Postural Changes and/or Swallow Maneuvers: Out of bed for meals;Seated upright 90 degrees;Upright 30-60 min after meal                 (Dietician f/u as needed) Oral care BID;Oral care before  and after PO;Patient independent with oral care (setup)   Intermittent Supervision/Assistance (Cognitive decline at baseline) Dysphagia, oropharyngeal phase (R13.12) (chronic Dysphagia; Cognitive decline baseline; Esophageal phase Dysmotility)     All goals met       Jerilynn Som, MS, CCC-SLP Speech Language Pathologist Rehab Services; [facility] [phone] (ascom) Jerrin Recore  07/18/2023, 6:02 PM

## 2023-07-19 DIAGNOSIS — R55 Syncope and collapse: Secondary | ICD-10-CM | POA: Diagnosis not present

## 2023-07-19 DIAGNOSIS — J9611 Chronic respiratory failure with hypoxia: Secondary | ICD-10-CM | POA: Diagnosis not present

## 2023-07-19 DIAGNOSIS — I5032 Chronic diastolic (congestive) heart failure: Secondary | ICD-10-CM | POA: Diagnosis not present

## 2023-07-19 DIAGNOSIS — J690 Pneumonitis due to inhalation of food and vomit: Secondary | ICD-10-CM | POA: Diagnosis not present

## 2023-07-19 MED ORDER — PREDNISONE 10 MG PO TABS: ORAL_TABLET | ORAL | 0 refills | Status: AC

## 2023-07-19 MED ORDER — AMOXICILLIN-POT CLAVULANATE 875-125 MG PO TABS: 1.0000 | ORAL_TABLET | Freq: Two times a day (BID) | ORAL | 0 refills | Status: AC

## 2023-07-19 MED ORDER — SENNOSIDES-DOCUSATE SODIUM 8.6-50 MG PO TABS
2.0000 | ORAL_TABLET | Freq: Two times a day (BID) | ORAL | Status: DC
  Administered 2023-07-19: 2 via ORAL
  Filled 2023-07-19: qty 2

## 2023-07-19 NOTE — Plan of Care (Signed)
  Problem: Education: Goal: Knowledge of General Education information will improve Description: Including pain rating scale, medication(s)/side effects and non-pharmacologic comfort measures Outcome: Progressing   Problem: Clinical Measurements: Goal: Respiratory complications will improve Outcome: Progressing   Problem: Clinical Measurements: Goal: Cardiovascular complication will be avoided Outcome: Progressing   Problem: Activity: Goal: Risk for activity intolerance will decrease Outcome: Progressing   Problem: Nutrition: Goal: Adequate nutrition will be maintained Outcome: Progressing   Problem: Elimination: Goal: Will not experience complications related to bowel motility Outcome: Progressing   Problem: Elimination: Goal: Will not experience complications related to urinary retention Outcome: Progressing   Problem: Pain Managment: Goal: General experience of comfort will improve Outcome: Progressing   Problem: Safety: Goal: Ability to remain free from injury will improve Outcome: Progressing

## 2023-07-19 NOTE — Consult Note (Signed)
[facility] Customer service manager [facility]) Accountable Care Organization (ACO) [facility] Liaison Note  07/19/2023  [name] 02-08-1971 [id number]  Location: [facility] RN Hospital Liaison screened the patient remotely at [facility].  Insurance: [place] Erika [name] is a 52 y.o. female who is a Primary Care Patient of Eden Emms, NP. The patient was screened for 30 day readmission hospitalization with noted high risk score for unplanned readmission risk with 3 IP/1 ED in 6 months.  The patient was assessed for potential [facility]) Care Management service needs for post hospital transition for care coordination. Review of patient's electronic medical record reveals patient was admitted with Pneumonia. Hospital liaison attempted outreach call for services however unsuccessful. Liaison will make a referral for a care coordinator for post hospital prevention and care management services.  Plan: [facility] Liaison will continue to follow progress and disposition to asess for post hospital community care coordination/management needs.  Referral request for community care coordination: anticipate [facility] Transitions of Care Team follow up.   [facility] Care Management/Population Health does not replace or interfere with any arrangements made by the Inpatient Transition of Care team.   For questions contact:   [name], RN, [facility] Liaison [place]   Population Health Office Hours MTWF  8:00 am-6:00 pm Off on Thursday [phone] mobile [phone] [Office toll free line] Office Hours are M-F 8:30 - 5 pm 24 hour nurse advise line [phone] Concierge  [name]

## 2023-07-19 NOTE — Progress Notes (Signed)
Checked in on patient while on the floor, patient is no longer on cpap.  I asked patient how long has she been off and she says approximately 10 minutes.  She stated she could not keep the mask from leaking, however no one called me about problems with the mask. Patient was found on room air, placed patient back on 4L after patient states she does not want to wear it again.

## 2023-07-19 NOTE — Discharge Summary (Addendum)
Physician Discharge Summary   Patient: Erika [name] MRN: [id number] DOB: 03-08-1971  Admit date:     07/16/2023  Discharge date: 07/19/23  Discharge Physician: Marrion Coy   PCP: Eden Emms, NP   Recommendations at discharge:   Follow-up with PCP in 1 week. Follow-up with pulmonology in 2 weeks. Use 4 L oxygen for now. May need monthly iron infusion.  Discharge Diagnoses: Principal Problem:   Syncope and collapse Active Problems:   Aspiration pneumonia of both lungs (HCC)   Orthostatic hypotension   Anemia   Chronic diastolic CHF (congestive heart failure) (HCC)   Hemiplegia and hemiparesis following cerebral infarction affecting left non-dominant side (HCC)   Type 2 diabetes mellitus with other specified complication (HCC)   GERD (gastroesophageal reflux disease)   Severe sepsis (HCC)   Reactive thrombocytosis   Infestation by bed bug   Chronic hypotension   Chronic hypoxemic respiratory failure (HCC)   Hypokalemia   Iron deficiency anemia   COPD with acute exacerbation (HCC) Overweight with a BMI of 29.21. Other diagnosis are in assessment and plan. Resolved Problems:   Reflux gastritis Hyponatremia. Hospital Course: Erika [name] is a 52 y.o. female with medical history significant for heart disease, hypertension, chronic respiratory failure on 6 L at home, hypertension, obesity presenting from cardiology office episode of passing out.  Patient had history of stroke 2 years ago, has been having recurrent aspiration pneumonia for the last 6 months.  Modified barium swallow done last admission showed significant aspiration. Upon arriving the hospital, patient was found to have increased heart rate, significant cytosis with white cell 24.2, lactic acid 3.0.  Procalcitonin level 1.98.  Patient was placed on antibiotics for aspiration pneumonia. Patient is treated with antibiotics, also placed on nectar thick liquid.  Patient has a profound risk for  aspiration. Condition has improved, currently on 4 L oxygen with good saturation.  Will continue complete 5 more days of antibiotics.  Follow-up with PCP and pulmonology as outpatient. Assessment and Plan:  Syncope and collapse Chronic Orthostatic hypotension Patient has a chronic hypotension, was followed by cardiology, currently treated with midodrine as well as Florinef. Syncope will appear to be secondary to also to hypotension in addition to pneumonia. Condition has improved.  Blood pressure is better after giving IV steroids.  At this point, we are reducing dose of steroids, patient can go back to home dose of midodrine and Florinef.      Aspiration pneumonia of both lungs (HCC) Severe sepsis secondary to aspiration pneumonia. Chronic hypoxemic respiratory failure. Acute respiratory failure ruled out. COPD exacerbation. Patient with chronically on 6 L oxygen, which does not appear to be worse. Patient has increased heart rate, severe leukocytosis, elevated lactic acid level at 3.0.  Patient meets severe sepsis criteria, this is secondary to aspiration pneumonia Reviewed patient CT scan results, independently reviewed CT images.  Patient has bilateral lower lobe infiltrates, consistent with aspiration pneumonia. Discussed with speech therapy, patient previously had a modified barium swallow, significant aspiration.  Patient was placed on nectar thick liquid, patient does not compliant with it, which has caused recurrent aspiration pneumonia. Discussed with the patient and the family, aspiration risk will unlikely to improve, long-term prognosis is poor. Patient now is compliant with nectar thickened liquid.  She feels much better, patient is also seen by pulmonology, started IV steroids. Condition had improved, will complete 7-day course with oral Augmentin, I also prescribed prednisone taper.     Iron deficient anemia. Reactive thrombocytosis. Patient  did not have active bleeding.   Patient received IV iron, hemoglobin is better today.   Chronic diastolic CHF (congestive heart failure) (HCC) Strict I/O Daily weight.  No evidence of exacerbation.     Hemiplegia and hemiparesis following cerebral infarction affecting left non-dominant side (HCC) Repeated MRI showed old stroke without acute changes.  Continue home medicines.     GERD (gastroesophageal reflux disease) PPI therapy.    Type 2 diabetes mellitus with other specified complication (HCC) Continue sliding scale insulin.   Infestation by bed bug Patient denies ever had a bedbug at home.      Pt has Chronic Respiratory Failure 2nd to  COPD.  Needs Non-invasive ventilator to sustain life and reduce subsequent hospitalizations"      Consultants: Pulmonology. Procedures performed: None  Disposition: Home Diet recommendation:  Cardiac diet with nectar thick liquid. DISCHARGE MEDICATION: Allergies as of 07/19/2023   No Active Allergies      Medication List     TAKE these medications    acetaminophen 325 MG tablet Commonly known as: TYLENOL Take 1-2 tablets (325-650 mg total) by mouth every 4 (four) hours as needed for mild pain.   albuterol (2.5 MG/3ML) 0.083% nebulizer solution Commonly known as: PROVENTIL Take 3 mLs (2.5 mg total) by nebulization every 4 (four) hours as needed for wheezing or shortness of breath.   amoxicillin-clavulanate 875-125 MG tablet Commonly known as: AUGMENTIN Take 1 tablet by mouth 2 (two) times daily for 5 days.   chlorproMAZINE 25 MG tablet Commonly known as: THORAZINE Take 3 tablets (75 mg total) by mouth at bedtime.   clonazePAM 0.5 MG tablet Commonly known as: KlonoPIN Take 0.5 tablets (0.25 mg total) by mouth 2 (two) times daily. Take 1/2 tab twice daily. No dissolving tablets.   clopidogrel 75 MG tablet Commonly known as: PLAVIX Take 1 tablet (75 mg total) by mouth daily.   cyclobenzaprine 10 MG tablet Commonly known as: FLEXERIL TAKE ONE TABLET  BY MOUTH AT BEDTIME   Dexcom G7 Sensor Misc APPLY ONE SENSOR TO THE BACK OF YOUR UPPER ARM. REPLACE EVERY 10 DAYS.   escitalopram 20 MG tablet Commonly known as: Lexapro Take 1 tablet (20 mg total) by mouth daily.   ferrous sulfate 324 (65 Fe) MG Tbec Take 1 tablet (324 mg total) by mouth 2 (two) times daily.   fludrocortisone 0.1 MG tablet Commonly known as: FLORINEF Take 1 tablet (0.1 mg total) by mouth daily.   icosapent Ethyl 1 g capsule Commonly known as: VASCEPA Take 2 g by mouth 2 (two) times daily.   lamoTRIgine 25 MG tablet Commonly known as: LaMICtal Take 1 tablet (25 mg total) by mouth daily. What changed: when to take this   lamoTRIgine 200 MG tablet Commonly known as: LAMICTAL Take 1 tablet (200 mg total) by mouth at bedtime. What changed: Another medication with the same name was changed. Make sure you understand how and when to take each.   metoprolol succinate 25 MG 24 hr tablet Commonly known as: TOPROL-XL Take 1 tablet (25 mg total) by mouth daily.   midodrine 5 MG tablet Commonly known as: PROAMATINE Take 1 tablet (5 mg total) by mouth 3 (three) times daily with meals.   Mounjaro 7.5 MG/0.5ML Pen Generic drug: tirzepatide INJECT THE CONTENTS OF ONE PEN UNDER THE SKIN WEEKLY ON THE SAME DAY EACH WEEK   nitroGLYCERIN 0.4 MG SL tablet Commonly known as: NITROSTAT Place 1 tablet (0.4 mg total) under the tongue every 5 (five) minutes  as needed for chest pain.   Oxycodone HCl 10 MG Tabs Take 1 tablet (10 mg total) by mouth 2 (two) times daily as needed.   pantoprazole 40 MG tablet Commonly known as: PROTONIX TAKE ONE TABLET BY MOUTH ONE TIME DAILY   pregabalin 225 MG capsule Commonly known as: LYRICA Take 1 capsule (225 mg total) by mouth 2 (two) times daily.   Repatha SureClick 140 MG/ML Soaj Generic drug: Evolocumab Inject 140 mg into the skin as directed. Inject 1 Dose into the skin every 14 (fourteen) days.   Stiolto Respimat 2.5-2.5  MCG/ACT Aers Generic drug: Tiotropium Bromide-Olodaterol Inhale 2 puffs into the lungs daily.   topiramate 25 MG tablet Commonly known as: Topamax Take 1 tablet (25 mg total) by mouth at bedtime.   UltiCare Mini Pen Needles 31G X 6 MM Misc Generic drug: Insulin Pen Needle USE PEN NEEDLES TWO TIMES DAILY               Discharge Care Instructions  (From admission, onward)           Start     Ordered   07/19/23 0000  Discharge wound care:       Comments: None active   07/19/23 0950            Follow-up Information     Eden Emms, NP Follow up in 1 week(s).   Specialties: Nurse Practitioner, Family Medicine Contact information: [address] [place] [address] [phone]         Salena Saner, MD Follow up in 2 week(s).   Specialty: Pulmonary Disease Contact information: [address] [place] [address] [phone]                Discharge Exam: Ceasar Mons Weights   07/16/23 1822  Weight: 82.1 kg   General exam: Appears calm and comfortable  Respiratory system: Clear to auscultation. Respiratory effort normal. Cardiovascular system: S1 & S2 heard, RRR. No JVD, murmurs, rubs, gallops or clicks. No pedal edema. Gastrointestinal system: Abdomen is nondistended, soft and nontender. No organomegaly or masses felt. Normal bowel sounds heard. Central nervous system: Alert and oriented. No focal neurological deficits. Extremities: Symmetric 5 x 5 power. Skin: No rashes, lesions or ulcers Psychiatry: Judgement and insight appear normal. Mood & affect appropriate.    Condition at discharge: fair  The results of significant diagnostics from this hospitalization (including imaging, microbiology, ancillary and laboratory) are listed below for reference.   Imaging Studies: ECHOCARDIOGRAM COMPLETE BUBBLE STUDY  Result Date: 07/17/2023    ECHOCARDIOGRAM REPORT   Patient Name:   AANIKA TAUSCHER Date of Exam:  07/17/2023 Medical Rec #:  [id number]           Height:       66.0 in Accession #:    [id number]          Weight:       181.0 lb Date of Birth:  07-06-1971          BSA:          1.917 m Patient Age:    51 years            BP:           94/54 mmHg Patient Gender: F                   HR:           93 bpm. Exam Location:  [facility] Procedure: 2D Echo, Cardiac Doppler, Color Doppler, Saline Contrast Bubble Study            and Intracardiac Opacification Agent Indications:     Syncope  History:         Patient has prior history of Echocardiogram examinations, most                  recent 01/27/2022. Cardiomegaly and CHF, Previous Myocardial                  Infarction and CAD, Prior CABG, Stroke and COPD,                  Arrythmias:Bradycardia, Signs/Symptoms:Syncope, Chest Pain and                  Dyspnea; Risk Factors:Hypertension, Sleep Apnea, Diabetes,                  Current Smoker and Dyslipidemia.  Sonographer:     Mikki Harbor Referring Phys:  [id number] EKTA V PATEL Diagnosing Phys: Julien Nordmann MD  Sonographer Comments: Technically difficult study due to poor echo windows, suboptimal apical window and suboptimal subcostal window. IMPRESSIONS  1. Left ventricular ejection fraction, by estimation, is 60 to 65%. The left ventricle has normal function. The left ventricle has no regional wall motion abnormalities. There is mild left ventricular hypertrophy. Left ventricular diastolic parameters are consistent with Grade I diastolic dysfunction (impaired relaxation).  2. Right ventricular systolic function is normal. The right ventricular size is normal. Tricuspid regurgitation signal is inadequate for assessing PA pressure.  3. The mitral valve is normal in structure. No evidence of mitral valve regurgitation. No evidence of mitral stenosis.  4. The aortic valve has an indeterminant number of cusps. Aortic valve regurgitation is not visualized. Aortic valve sclerosis is present, with no evidence of aortic valve  stenosis.  5. The inferior vena cava is normal in size with greater than 50% respiratory variability, suggesting right atrial pressure of 3 mmHg.  6. Agitated saline contrast bubble study was negative, with no evidence of any interatrial shunt. FINDINGS  Left Ventricle: Left ventricular ejection fraction, by estimation, is 60 to 65%. The left ventricle has normal function. The left ventricle has no regional wall motion abnormalities. Definity contrast agent was given IV to delineate the left ventricular  endocardial borders. The left ventricular internal cavity size was normal in size. There is mild left ventricular hypertrophy. Left ventricular diastolic parameters are consistent with Grade I diastolic dysfunction (impaired relaxation). Right Ventricle: The right ventricular size is normal. No increase in right ventricular wall thickness. Right ventricular systolic function is normal. Tricuspid regurgitation signal is inadequate for assessing PA pressure. Left Atrium: Left atrial size was normal in size. Right Atrium: Right atrial size was normal in size. Pericardium: There is no evidence of pericardial effusion. Mitral Valve: The mitral valve is normal in structure. Mild mitral annular calcification. No evidence of mitral valve regurgitation. No evidence of mitral valve stenosis. MV peak gradient, 6.6 mmHg. The mean mitral valve gradient is 3.0 mmHg. Tricuspid Valve: The tricuspid valve is normal in structure. Tricuspid valve regurgitation is not demonstrated. No evidence of tricuspid stenosis. Aortic Valve: The aortic valve has an indeterminant number of cusps. Aortic valve regurgitation is not visualized. Aortic valve sclerosis is present, with no evidence of aortic valve stenosis. Aortic valve mean gradient measures 5.0 mmHg. Aortic valve peak gradient measures 8.1 mmHg. Aortic valve area, by VTI measures 2.19 cm.  Pulmonic Valve: The pulmonic valve was normal in structure. Pulmonic valve regurgitation is not  visualized. No evidence of pulmonic stenosis. Aorta: The aortic root is normal in size and structure. Venous: The inferior vena cava is normal in size with greater than 50% respiratory variability, suggesting right atrial pressure of 3 mmHg. IAS/Shunts: No atrial level shunt detected by color flow Doppler. Agitated saline contrast was given intravenously to evaluate for intracardiac shunting. Agitated saline contrast bubble study was negative, with no evidence of any interatrial shunt. There  is no evidence of a patent foramen ovale. There is no evidence of an atrial septal defect.  LEFT VENTRICLE PLAX 2D LVIDd:         4.30 cm   Diastology LVIDs:         3.00 cm   LV e' medial:    6.74 cm/s LV PW:         1.20 cm   LV E/e' medial:  14.7 LV IVS:        1.20 cm   LV e' lateral:   8.32 cm/s LVOT diam:     2.00 cm   LV E/e' lateral: 11.9 LV SV:         75 LV SV Index:   39 LVOT Area:     3.14 cm  RIGHT VENTRICLE RV Basal diam:  3.25 cm RV Mid diam:    3.60 cm RV S prime:     11.10 cm/s TAPSE (M-mode): 2.0 cm LEFT ATRIUM             Index        RIGHT ATRIUM           Index LA diam:        3.30 cm 1.72 cm/m   RA Area:     13.30 cm LA Vol (A2C):   37.1 ml 19.36 ml/m  RA Volume:   33.90 ml  17.69 ml/m LA Vol (A4C):   40.5 ml 21.13 ml/m LA Biplane Vol: 38.2 ml 19.93 ml/m  AORTIC VALVE                    PULMONIC VALVE AV Area (Vmax):    2.70 cm     PV Vmax:       1.09 m/s AV Area (Vmean):   2.62 cm     PV Peak grad:  4.8 mmHg AV Area (VTI):     2.19 cm AV Vmax:           142.00 cm/s AV Vmean:          99.700 cm/s AV VTI:            0.343 m AV Peak Grad:      8.1 mmHg AV Mean Grad:      5.0 mmHg LVOT Vmax:         122.00 cm/s LVOT Vmean:        83.300 cm/s LVOT VTI:          0.239 m LVOT/AV VTI ratio: 0.70  AORTA Ao Root diam: 3.30 cm MITRAL VALVE MV Area (PHT): 4.17 cm     SHUNTS MV Area VTI:   2.95 cm     Systemic VTI:  0.24 m MV Peak grad:  6.6 mmHg     Systemic Diam: 2.00 cm MV Mean grad:  3.0 mmHg MV Vmax:        1.28 m/s MV Vmean:      73.3 cm/s MV Decel Time:  182 msec MV E velocity: 99.00 cm/s MV A velocity: 111.00 cm/s MV E/A ratio:  0.89 Julien Nordmann MD Electronically signed by Julien Nordmann MD Signature Date/Time: 07/17/2023/1:46:02 PM    Final    MR BRAIN WO CONTRAST  Result Date: 07/17/2023 CLINICAL DATA:  Initial evaluation for syncope/presyncope. EXAM: MRI HEAD WITHOUT CONTRAST TECHNIQUE: Multiplanar, multiecho pulse sequences of the brain and surrounding structures were obtained without intravenous contrast. COMPARISON:  Prior MRI from 06/03/2023. FINDINGS: Brain: Cerebral volume within normal limits. Patchy T2/FLAIR hyperintensity involving the periventricular and deep white matter, consistent with chronic small vessel ischemic disease, stable. Remote cortical infarcts involving the right parietal and occipital lobes. Remote right pontine infarct. FLAIR signal abnormality extending into the middle cerebellar peduncles could reflect gliosis and/or degeneration, stable. Additional small remote lacunar infarcts about the left basal ganglia and left thalamus, also unchanged. No abnormal foci of restricted diffusion to suggest acute or subacute ischemia. Gray-white matter distension otherwise maintained. No acute intracranial hemorrhage. Few punctate chronic micro hemorrhages noted, likely small vessel related, stable. No mass lesion, midline shift or mass effect. No hydrocephalus or extra-axial fluid collection. Pituitary gland suprasellar region within normal limits. Segment beyond the takeoff of the left PICA, which could be related to slow flow and/or occlusion, stable. Major intracranial vascular flow voids are otherwise maintained. Skull and upper cervical spine: Craniocervical junction within normal limits. Diffusely decreased T1 signal intensity noted throughout the visualized bone marrow, nonspecific, but most commonly related to anemia, smoking or obesity. No visible focal marrow replacing lesion.  No scalp soft tissue abnormality. Sinuses/Orbits: Globes orbital soft tissues within normal limits. Paranasal sinuses are largely clear. Right-to-left nasal septal deviation with associated concha bullosa. No mastoid effusion. Other: None. IMPRESSION: 1. No acute intracranial abnormality. 2. Chronic right parieto-occipital and right pontine infarcts, with additional remote lacunar infarcts about the left basal ganglia and left thalamus, stable. 3. Underlying moderate chronic microvascular ischemic disease, stable. Electronically Signed   By: Rise Mu M.D.   On: 07/17/2023 00:29   DG Shoulder Left  Result Date: 07/16/2023 CLINICAL DATA:  Abnormal chest x-ray EXAM: LEFT SHOULDER - 2+ VIEW COMPARISON:  Chest x-ray earlier today FINDINGS: There is no evidence of fracture or dislocation. There is no evidence of arthropathy or other focal bone abnormality. Soft tissues are unremarkable. IMPRESSION: Negative. Electronically Signed   By: Charlett Nose M.D.   On: 07/16/2023 23:20   CT Angio Chest Pulmonary Embolism (PE) W or WO Contrast  Result Date: 07/16/2023 CLINICAL DATA:  Pulmonary embolism (PE) suspected, high prob EXAM: CT ANGIOGRAPHY CHEST WITH CONTRAST TECHNIQUE: Multidetector CT imaging of the chest was performed using the standard protocol during bolus administration of intravenous contrast. Multiplanar CT image reconstructions and MIPs were obtained to evaluate the vascular anatomy. RADIATION DOSE REDUCTION: This exam was performed according to the departmental dose-optimization program which includes automated exposure control, adjustment of the mA and/or kV according to patient size and/or use of iterative reconstruction technique. CONTRAST:  75mL OMNIPAQUE IOHEXOL 350 MG/ML SOLN COMPARISON:  Chest x-ray today FINDINGS: Cardiovascular: No filling defects in the pulmonary arteries to suggest pulmonary emboli. Heart is normal size. Aorta is normal caliber. Prior CABG. Aortic atherosclerosis.  Mediastinum/Nodes: No axillary adenopathy. Mildly prominent mediastinal and bilateral hilar lymph nodes, stable since prior study. Mildly distended esophagus with fluid. This is stable since prior study. Lungs/Pleura: Bilateral lower lobe airspace disease, slightly worsened since prior CT concerning for multifocal pneumonia. No effusions. Upper Abdomen: No acute findings Musculoskeletal: Chest  wall soft tissues are unremarkable. No acute bony abnormality. Review of the MIP images confirms the above findings. IMPRESSION: No evidence of pulmonary embolus. Bilateral lower lobe airspace opacities, increased since prior CT compatible with worsening multifocal pneumonia. Stable prominent mediastinal and bilateral hilar lymph nodes, likely reactive. Stable patulous fluid-filled esophagus. Aortic Atherosclerosis (ICD10-I70.0). Electronically Signed   By: Charlett Nose M.D.   On: 07/16/2023 23:13   DG Chest 2 View  Result Date: 07/16/2023 CLINICAL DATA:  Cough, pneumonia EXAM: CHEST - 2 VIEW COMPARISON:  Previous studies including the examination of 07/03/2023 FINDINGS: There is poor inspiration. There are patchy infiltrates in both lower lung fields. There is interval worsening of infiltrates in left lower lung field. Costophrenic angles are clear. There is no pneumothorax. There is previous coronary bypass surgery. There is possible offset in alignment of left humeral head and glenoid. This finding is not fully evaluated. IMPRESSION: There are patchy infiltrates in both lower lung fields. There is interval worsening of infiltrates in left lower lung field. Findings suggest possible multifocal pneumonia with interval worsening in left lower lung field. There is possible offset in alignment of left humeral head and glenoid. If clinically warranted, routine radiographs of left shoulder may be considered. Electronically Signed   By: Ernie Avena M.D.   On: 07/16/2023 15:37   DG Swallowing Func-Speech  Pathology  Result Date: 07/05/2023 Table formatting from the original result was not included. Modified Barium Swallow Study Patient Details Name: CHELESEA VISCARDI MRN: [id number] Date of Birth: 1971-08-04 Today's Date: 07/05/2023 HPI/PMH: HPI: Per H&P: "DENORA INGHAM is a 52 y.o. female with medical history significant of multiple medical issues including chronic respiratory failure on 6 L, multivessel CAD status post CABG, carotid artery disease, R CVA with chronic left-sided deficits, hypertension, peripheral artery disease status post stenting, type 2 diabetes, hyperlipidemia, HFpEF complex deep apnea syndrome presenting with acute on chronic respiratory failure with hypoxia, multifocal pneumonia with concern for aspiration, left-sided weakness. Noted recent admission at the end of June for similar issues including sepsis and aspiration pneumonia.  CT of the chest with bibasilar patchy consolidative opacities concerning for multifocal pneumonia. Also with patulous fluid-filled esophagus and minimal layering debris concerning for intermittent recurrent aspiration.".  Pt currently on 6L O2 (consistent with home setting) and a regular solids and thin liquids diet - she states she drinks from a "Stanley cup w/ Straw" at home in a recliner where she sits much of the day per her report. Clinical Impression: Patient presents with moderate and chronic pharyngeal phase dysphagia w/ mild oral phase dysphagia. Similar was noted per MBSS 06/2021 s/p R CVA, w/ resulting L sided weakness. Oral stage is characterized by inconsistnet lip closure w/ escape of thin liquids x2 and min increased bolus preparation and mastication time w/ solids. Bolus containment and anterior to posterior transit time was functional. Swallow initiation occurs at the level of the pyriform sinuses for thin liquids, spilling to the p.s. for nectar liquids, and the valleculae for remaining consistencies. Pharyngeal stage is noted for functional  tongue base retraction, adequate hyolaryngeal excursion, and adequate pharyngeal constriction. Epiglottic deflection is complete during timely pharyngeal swallows. HOWEVER, w/ thin liquid consistencies and the delay in pharyngeal swallow initiation, laryngeal penetration and aspiration(x1 trial) occurs d/t the decreased pharyngeal sensation and reduced timing of epiglottic inversion. This results in laryngeal penetration(to the cords x1) and aspiration(x1) w/ thins; coating along underneath side of the epiglottis w/ nectar liquids. Pt exhibited delayed sensation to the aspiration;  no apparent sensation to the laryngeal penetration occurring. Instructed pt on using a throat/re-swallow to aid clearing of vestibule. No overt pharyngeal residue noted; pharyngeal stripping wave is complete. Amplitude/duration of cricopharyngeus opening is WFL. There is adequate/complete clearance through the cervical esophagus. Consistencies tested were thin liquids x2 tsps, 2-3 sequential sips(x3), nectar x1 tsp, 1 cup sip, 2 sequential cup sips, honey x1 tsp, pudding x1 tsp, regular solid (1/2 graham cracker with pudding).  Recommend patient continue regular diet with meats/foods well-Cut and Moistened; thin liquids VIA CUP ONLY -- NO STRAWS. Instructed on throat clear-re-swallow when drinking liquids, and general aspiration precautions including moistening foods well fo rease of chewing and SMALL bites/sips. Educated pt verbally and in handout form re: strategy and precautions. Factors that may increase risk of adverse event in presence of aspiration Rubye Oaks & Clearance Coots 2021): Factors that may increase risk of adverse event in presence of aspiration Rubye Oaks & Clearance Coots 2021): Limited mobility (prior R CVA) Recommendations/Plan: Swallowing Evaluation Recommendations Swallowing Evaluation Recommendations Recommendations: PO diet PO Diet Recommendation: Regular; Thin liquids (Level 0) (foods/meats Cut Small and Moistened) Liquid  Administration via: Cup; No straw Medication Administration: Whole meds with puree (baseline now) Supervision: Patient able to self-feed; Intermittent supervision/cueing for swallowing strategies; Set-up assistance for safety Swallowing strategies  : Minimize environmental distractions; Slow rate; Small bites/sips; Check for pocketing or oral holding; Check for anterior loss; Follow solids with liquids Postural changes: Position pt fully upright for meals; Stay upright 30-60 min after meals; Out of bed for meals Oral care recommendations: Oral care BID (2x/day); Pt independent with oral care (w/ setup support) Recommended consults: -- (Neurology) Caregiver Recommendations: -- (avoid Mixed consistencies; Cream Soups recommended) F/u w/ education on dysphagia/precautions as recommended and support at next venue of care as indicated/desired. Treatment Plan Treatment Plan Treatment recommendations: Defer treatment plan to SLP at other venue (see follow-up recommendations) Follow-up recommendations: Home health SLP (next venue of care if desired) Recommendations Comment: ongoing education as needed Functional status assessment: Patient has had a recent decline in their functional status and/or demonstrates limited ability to make significant improvements in function in a reasonable and predictable amount of time. Treatment frequency: -- (n/a) Treatment duration: -- (n/a) Interventions: Aspiration precaution training; Patient/family education Recommendations Recommendations for follow up therapy are one component of a multi-disciplinary discharge planning process, led by the attending physician.  Recommendations may be updated based on patient status, additional functional criteria and insurance authorization. Assessment: Orofacial Exam: Orofacial Exam Oral Cavity: Oral Hygiene: WFL Oral Cavity - Dentition: Dentures, top; Dentures, bottom Orofacial Anatomy: WFL Oral Motor/Sensory Function: Suspected cranial nerve  impairment (slight) CN V - Trigeminal: Left sensory impairment CN VII - Facial: Left motor impairment CN IX - Glossopharyngeal, CN X - Vagus: Left motor impairment CN XII - Hypoglossal: Left motor impairment Anatomy: Anatomy: WFL Boluses Administered: Boluses Administered Boluses Administered: Thin liquids (Level 0); Mildly thick liquids (Level 2, nectar thick); Moderately thick liquids (Level 3, honey thick); Puree; Solid  Oral Impairment Domain: Oral Impairment Domain Lip Closure: Escape progressing to mid-chin (thins) Tongue control during bolus hold: Cohesive bolus between tongue to palatal seal Bolus preparation/mastication: Slow prolonged chewing/mashing with complete recollection (w/ solid) Bolus transport/lingual motion: Delayed initiation of tongue motion (oral holding) (x1) Oral residue: Trace residue lining oral structures (w/ solid then cleared w/ lingual sweep and f/u swallow) Location of oral residue : Tongue (w/ solid) Initiation of pharyngeal swallow : Pyriform sinuses (w/ thins, spilling to w/ Nectar; Valleculae w/ puree/solid)  Pharyngeal  Impairment Domain: Pharyngeal Impairment Domain Soft palate elevation: No bolus between soft palate (SP)/pharyngeal wall (PW) Laryngeal elevation: Complete superior movement of thyroid cartilage with complete approximation of arytenoids to epiglottic petiole Anterior hyoid excursion: Complete anterior movement Epiglottic movement: Complete inversion Laryngeal vestibule closure: Incomplete, narrow column air/contrast in laryngeal vestibule Pharyngeal stripping wave : Present - complete Pharyngeal contraction (A/P view only): [name] Pharyngoesophageal segment opening: Complete distension and complete duration, no obstruction of flow Tongue base retraction: No contrast between tongue base and posterior pharyngeal wall (PPW) Pharyngeal residue: Complete pharyngeal clearance Location of pharyngeal residue: [name]  Esophageal Impairment Domain: Esophageal Impairment Domain  Esophageal clearance upright position: Complete clearance, esophageal coating Pill: Pill Consistency administered: -- (NT) Penetration/Aspiration Scale Score: Penetration/Aspiration Scale Score 1.  Material does not enter airway: Moderately thick liquids (Level 3, honey thick); Puree; Solid 2.  Material enters airway, remains ABOVE vocal cords then ejected out: Mildly thick liquids (Level 2, nectar thick) (w/ swallow) 3.  Material enters airway, remains ABOVE vocal cords and not ejected out: Thin liquids (Level 0) 6.  Material enters airway, passes BELOW cords then ejected out: Thin liquids (Level 0) (x1 post spontaneous cough) Compensatory Strategies: Compensatory Strategies Compensatory strategies: Yes Other(comment): -- (throat clear/swallow post laryngeal penetration of thins appeared to aid clearing of vestibule)   General Information: Caregiver present: No  Diet Prior to this Study: Regular; Thin liquids (Level 0) (w/ straw use)   Temperature : Normal (WBC not elevated)   Respiratory Status: WFL   Supplemental O2: Nasal cannula (6L)   History of Recent Intubation: No  Behavior/Cognition: Alert; Cooperative; Pleasant mood (slightly impulsive) Self-Feeding Abilities: Able to self-feed; Needs set-up for self-feeding; Needs assist with self-feeding (Supervision beneficial) Baseline vocal quality/speech: Normal (slight dysarthria) Volitional Cough: Able to elicit Volitional Swallow: Able to elicit Exam Limitations: No limitations Goal Planning: Prognosis for improved oropharyngeal function: Fair Barriers to Reach Goals: Time post onset; Severity of deficits; Behavior Barriers/Prognosis Comment: Prior R CVA w/ L sided weakness Patient/Family Stated Goal: to drink my coffee Consulted and agree with results and recommendations: Patient; Physician; Nurse Pain: Pain Assessment Pain Assessment: No/denies pain Pain Score: 9 Breathing: 0 Negative Vocalization: 0 Facial Expression: 0 Body Language: 0 Consolability: 0 PAINAD  Score: 0 Pain Location: cluster HA's (pt reports h/o these) Pain Descriptors / Indicators: Headache Pain Intervention(s): Limited activity within patient's tolerance; Monitored during session; Premedicated before session; Repositioned (pt reports recent pain meds for HA) End of Session: Start Time:SLP Start Time (ACUTE ONLY): 0810 Stop Time: SLP Stop Time (ACUTE ONLY): 0930 Time Calculation:SLP Time Calculation (min) (ACUTE ONLY): 80 min Charges: SLP Evaluations $ SLP Speech Visit: 1 Visit SLP Evaluations $BSS Swallow: 1 Procedure $MBS Swallow: 1 Procedure SLP visit diagnosis: SLP Visit Diagnosis: Dysphagia, oropharyngeal phase (R13.12) Past Medical History: Past Medical History: Diagnosis Date  Acute respiratory failure with hypoxia (HCC) 06/24/2021  Arthralgia of temporomandibular joint   CAD, multiple vessel   a. 06/2016 Cath: ostLM 40%, ostLAD 40%, pLAD 95%, ost-pLCx 60%, pLCx 95%, mLCx 60%, mRCA 95%, D2 50%, LVSF nl;  b. 07/2016 CABG x 4 (LIMA->LAD, VG->Diag, VG->OM, VG->RCA); c. 08/2016 Cath: 3VD w/ 4/4 patent grafts. LAD distal to LIMA has diff dzs->Med rx; d. 08/2020 Cath: 4/4 patent grafts, native 3VD. EF 55-65%-->Med Rx.  Carotid arterial disease (HCC)   a. 07/2016 s/p R CEA; b. 02/2021 U/S: RICA 40-59%, LICA 1-39%.  Cerebrovascular disease   Clotting disorder (HCC)   Complex sleep apnea syndrome 10/03/2022  Depression  Diastolic dysfunction   a. 06/2016 Echo: EF 50-55%, mild inf wall HK, GR1DD, mild MR, RV sys fxn nl, mildly dilated LA, PASP nl; b. 06/2021 Echo: EF 60-65%, no rwma. Nl RV fxn.  Fatty liver disease, nonalcoholic 2016  History of blood transfusion   with heart surgery  HLD (hyperlipidemia)   Labile hypertension   a. prior renal ngiogram negative for RAS in 03/2016; b. catecholamines and metanephrines normal, mildly elevated renin with normal aldosterone and normal ratio in 02/2016  Myocardial infarction [facility]) 2017  Obesity   PAD (peripheral artery disease) (HCC)   a. 09/2018 s/p L SFA stenting; b.  07/2019 Periph Angio: Patent m/d L SFA stent w/ 100% L SFA distal to stent. L AT 100d, L Peroneal diff dzs-->Med Rx; c. 02/2021 ABIs: stable @ 0.61 on R and 0.46 on L.  PTSD (post-traumatic stress disorder)   Stroke (HCC)   Tobacco abuse   Type 2 diabetes mellitus (HCC) 12/2015 Past Surgical History: Past Surgical History: Procedure Laterality Date  ABDOMINAL AORTOGRAM W/LOWER EXTREMITY [name] 10/15/2018  Procedure: ABDOMINAL AORTOGRAM W/LOWER EXTREMITY;  Surgeon: Iran Ouch, MD;  Location: [facility];  Service: Cardiovascular;  Laterality: [name];  ABDOMINAL AORTOGRAM W/LOWER EXTREMITY Bilateral 08/19/2019  Procedure: ABDOMINAL AORTOGRAM W/LOWER EXTREMITY;  Surgeon: Iran Ouch, MD;  Location: [facility];  Service: Cardiovascular;  Laterality: Bilateral;  CARDIAC CATHETERIZATION [name] 06/29/2016  Procedure: Left Heart Cath and Coronary Angiography;  Surgeon: Antonieta Iba, MD;  Location: [facility];  Service: Cardiovascular;  Laterality: [name];  CARDIAC CATHETERIZATION [name] 08/29/2016  Procedure: Left Heart Cath and Cors/Grafts Angiography;  Surgeon: Iran Ouch, MD;  Location: [facility];  Service: Cardiovascular;  Laterality: [name];  CESAREAN SECTION    CHOLECYSTECTOMY    CORONARY ARTERY BYPASS GRAFT [name] 07/06/2016  Procedure: CORONARY ARTERY BYPASS GRAFTING (CABG) x four, using left internal mammary artery and right leg greater saphenous vein harvested endoscopically;  Surgeon: Kerin Perna, MD;  Location: [facility] OR;  Service: Open Heart Surgery;  Laterality: [name];  ENDARTERECTOMY Right 07/06/2016  Procedure: ENDARTERECTOMY CAROTID;  Surgeon: Larina Earthly, MD;  Location: [facility] OR;  Service: Vascular;  Laterality: Right;  ENDARTERECTOMY Right 04/27/2020  Procedure: REDO OF RIGHT ENDARTERECTOMY CAROTID;  Surgeon: Larina Earthly, MD;  Location: [facility] OR;  Service: Vascular;  Laterality: Right;  LEFT HEART CATH AND CORS/GRAFTS ANGIOGRAPHY [name] 08/24/2020  Procedure: LEFT HEART CATH AND CORS/GRAFTS  ANGIOGRAPHY;  Surgeon: Iran Ouch, MD;  Location: [facility];  Service: Cardiovascular;  Laterality: [name];  LEFT HEART CATH AND CORS/GRAFTS ANGIOGRAPHY [name] 01/29/2022  Procedure: LEFT HEART CATH AND CORS/GRAFTS ANGIOGRAPHY;  Surgeon: Iran Ouch, MD;  Location: [facility];  Service: Cardiovascular;  Laterality: [name];  PERIPHERAL VASCULAR CATHETERIZATION [name] 04/18/2016  Procedure: Renal Angiography;  Surgeon: Iran Ouch, MD;  Location: [facility];  Service: Cardiovascular;  Laterality: [name];  PERIPHERAL VASCULAR INTERVENTION Left 10/15/2018  Procedure: PERIPHERAL VASCULAR INTERVENTION;  Surgeon: Iran Ouch, MD;  Location: [facility];  Service: Cardiovascular;  Laterality: Left;  Left superficial femoral  TEE WITHOUT CARDIOVERSION [name] 07/06/2016  Procedure: TRANSESOPHAGEAL ECHOCARDIOGRAM (TEE);  Surgeon: Kerin Perna, MD;  Location: [facility] OR;  Service: Open Heart Surgery;  Laterality: [name];  TONSILLECTOMY   Jerilynn Som, MS, CCC-SLP Speech Language Pathologist Rehab Services; [facility] [phone] (ascom) Watson,Katherine 07/05/2023, 9:50 AM  MR BRAIN WO CONTRAST  Result Date: 07/03/2023 CLINICAL DATA:  History of CVA with residual left-sided weakness presents with multiple episodes of passing out. EXAM: MRI HEAD WITHOUT CONTRAST TECHNIQUE: Multiplanar, multiecho pulse sequences of the brain and surrounding structures were obtained without intravenous contrast. COMPARISON:  CT head 06/11/2023, brain MRI 01/26/2022 FINDINGS: Brain: There is no acute intracranial hemorrhage, extra-axial fluid collection, or acute infarct. Background parenchymal volume is normal. Remote infarcts in the right parietal and occipital lobes are unchanged, with unchanged slight ex vacuo dilatation of the right lateral ventricle. Additional remote infarcts in the pons, left basal ganglia, and left thalamus are also unchanged. Gliosis oral and degeneration in the bilateral  middle cerebellar peduncles is unchanged. Additional patchy FLAIR signal abnormality in the supratentorial white matter consistent with underlying chronic small-vessel ischemic change is stable, accelerated for age. The pituitary and suprasellar region are normal. There is no mass lesion. There is no mass effect or midline shift. Vascular: Normal flow voids. Skull and upper cervical spine: Normal marrow signal. Sinuses/Orbits: The paranasal sinuses are clear. The globes and orbits are unremarkable. Other: The mastoid air cells and middle ear cavities are clear. IMPRESSION: 1. No acute intracranial pathology. 2. Unchanged remote infarcts and background chronic small-vessel ischemic change as above. Electronically Signed   By: Lesia Hausen M.D.   On: 07/03/2023 15:32   CT Angio Chest PE W/Cm &/Or Wo Cm  Result Date: 07/03/2023 CLINICAL DATA:  Shortness of breath, history of COPD. Recently diagnosed with multifocal pneumonia in June 2024. EXAM: CT ANGIOGRAPHY CHEST WITH CONTRAST TECHNIQUE: Multidetector CT imaging of the chest was performed using the standard protocol during bolus administration of intravenous contrast. Multiplanar CT image reconstructions and MIPs were obtained to evaluate the vascular anatomy. RADIATION DOSE REDUCTION: This exam was performed according to the departmental dose-optimization program which includes automated exposure control, adjustment of the mA and/or kV according to patient size and/or use of iterative reconstruction technique. CONTRAST:  75mL OMNIPAQUE IOHEXOL 350 MG/ML SOLN COMPARISON:  Chest CT June 12, 2023 and May 29, 2023 FINDINGS: Cardiovascular: Satisfactory opacification of the pulmonary arteries to the segmental level. No evidence of pulmonary embolism. Normal heart size. No pericardial effusion. Severe coronary artery atherosclerosis. Thoracic aortic atherosclerosis. Status post median sternotomy and CABG. Mediastinum/Nodes: The thyroid gland is within normal limits.  No axillary lymphadenopathy. Unchanged prominent mediastinal and hilar lymph nodes. An index right hilar lymph node measures up to 13 mm in short axis on series 4, image 64. Patulous and fluid-filled esophagus. Lungs/Pleura: Patent central airways. Minimal layering debris in the proximal trachea. Respiratory motion artifact slightly limits evaluation of the lung parenchyma. Bibasilar patchy consolidative opacities with air bronchograms, slightly decreased compared to June 12, 2023. No new focal pulmonary opacity. No pleural effusion or pneumothorax. Upper Abdomen: No acute abnormality. Musculoskeletal: No chest wall abnormality. No acute or significant osseous findings. Review of the MIP images confirms the above findings. IMPRESSION: 1. Bibasilar patchy consolidative opacities, decreased in extent since June 12, 2023, likely representing evolving multifocal pneumonia. 2. Patulous fluid-filled esophagus and minimal layering debris in the proximal trachea, raising concern for intermittent/recurrent aspiration. 3. Unchanged prominent mediastinal and hilar lymph nodes, likely reactive. Electronically Signed   By: Jacob Moores M.D.   On: 07/03/2023 12:56   DG Chest Port 1 View  Result Date: 07/03/2023 CLINICAL DATA:  Provided history: Questionable sepsis-evaluate for abnormality. Shortness of breath. Syncopal episode. EXAM: PORTABLE CHEST 1 VIEW COMPARISON:  Prior chest radiographs 06/24/2023 and earlier. FINDINGS: Prior median sternotomy/CABG. Redemonstrated fractures within superior sternotomy wires. The cardiomediastinal  silhouette is unchanged. Ill-defined opacity within the right lung base, progressed from the prior examination of 06/24/2023. No appreciable airspace consolidation on the left. No evidence of pleural effusion or pneumothorax. No acute osseous abnormality identified. IMPRESSION: Ill-defined opacity within the right lung base. This is progressed from the prior examination of 06/24/2023 and may  reflect atelectasis and/or airspace consolidation. A superimposed component of scarring is possible. Electronically Signed   By: Jackey Loge D.O.   On: 07/03/2023 11:55   DG Chest 2 View  Result Date: 06/24/2023 CLINICAL DATA:  Follow-up pneumonia EXAM: CHEST - 2 VIEW COMPARISON:  Chest x-ray dated June 12, 2023 FINDINGS: Cardiac and mediastinal contours are unchanged. Prior median sternotomy with unchanged fractures of the superior wires. Interval improved aeration of the right lung base. Medial right lower lobe opacity has been present on multiple priors and is likely due to scarring. No evidence of pleural effusion or pneumothorax. IMPRESSION: 1. Interval improved aeration of the right lung base. 2. Persistent medial right lower lobe opacity which has been present on multiple priors and is likely due to scarring. Follow-up chest CT could be performed for better evaluation. Electronically Signed   By: Allegra Lai M.D.   On: 06/24/2023 17:20    Microbiology: Results for orders placed or performed during the hospital encounter of 07/16/23  Resp panel by RT-PCR (RSV, Flu A&B, Covid) Anterior Nasal Swab     Status: None   Collection Time: 07/16/23  7:32 PM   Specimen: Anterior Nasal Swab  Result Value Ref Range Status   SARS Coronavirus 2 by RT PCR NEGATIVE NEGATIVE Final    Comment: (NOTE) SARS-CoV-2 target nucleic acids are NOT DETECTED.  The SARS-CoV-2 RNA is generally detectable in upper respiratory specimens during the acute phase of infection. The lowest concentration of SARS-CoV-2 viral copies this assay can detect is 138 copies/mL. A negative result does not preclude SARS-Cov-2 infection and should not be used as the sole basis for treatment or other patient management decisions. A negative result may occur with  improper specimen collection/handling, submission of specimen other than nasopharyngeal swab, presence of viral mutation(s) within the areas targeted by this assay, and  inadequate number of viral copies(<138 copies/mL). A negative result must be combined with clinical observations, patient history, and epidemiological information. The expected result is Negative.  Fact Sheet for Patients:  BloggerCourse.com  Fact Sheet for Healthcare Providers:  SeriousBroker.it  This test is no t yet approved or cleared by the [place] FDA and  has been authorized for detection and/or diagnosis of SARS-CoV-2 by FDA under an Emergency Use Authorization (EUA). This EUA will remain  in effect (meaning this test can be used) for the duration of the COVID-19 declaration under Section 564(b)(1) of the Act, 21 U.S.C.section 360bbb-3(b)(1), unless the authorization is terminated  or revoked sooner.       Influenza A by PCR NEGATIVE NEGATIVE Final   Influenza B by PCR NEGATIVE NEGATIVE Final    Comment: (NOTE) The Xpert Xpress SARS-CoV-2/FLU/RSV plus assay is intended as an aid in the diagnosis of influenza from Nasopharyngeal swab specimens and should not be used as a sole basis for treatment. Nasal washings and aspirates are unacceptable for Xpert Xpress SARS-CoV-2/FLU/RSV testing.  Fact Sheet for Patients: BloggerCourse.com  Fact Sheet for Healthcare Providers: SeriousBroker.it  This test is not yet approved or cleared by the [place] FDA and has been authorized for detection and/or diagnosis of SARS-CoV-2 by FDA under an Emergency Use Authorization (EUA).  This EUA will remain in effect (meaning this test can be used) for the duration of the COVID-19 declaration under Section 564(b)(1) of the Act, 21 U.S.C. section 360bbb-3(b)(1), unless the authorization is terminated or revoked.     Resp Syncytial Virus by PCR NEGATIVE NEGATIVE Final    Comment: (NOTE) Fact Sheet for Patients: BloggerCourse.com  Fact Sheet for Healthcare  Providers: SeriousBroker.it  This test is not yet approved or cleared by the [place] FDA and has been authorized for detection and/or diagnosis of SARS-CoV-2 by FDA under an Emergency Use Authorization (EUA). This EUA will remain in effect (meaning this test can be used) for the duration of the COVID-19 declaration under Section 564(b)(1) of the Act, 21 U.S.C. section 360bbb-3(b)(1), unless the authorization is terminated or revoked.  Performed at [facility], [address] [place], [place] [address]   Blood Culture (routine x 2)     Status: None (Preliminary result)   Collection Time: 07/16/23  7:32 PM   Specimen: BLOOD  Result Value Ref Range Status   Specimen Description BLOOD BLOOD RIGHT ARM  Final   Special Requests   Final    BOTTLES DRAWN AEROBIC AND ANAEROBIC Blood Culture adequate volume   Culture   Final    NO GROWTH 3 DAYS Performed at [facility], [address] [place], [place] [address]    Report Status PENDING  Incomplete  Blood Culture (routine x 2)     Status: None (Preliminary result)   Collection Time: 07/16/23  7:32 PM   Specimen: BLOOD  Result Value Ref Range Status   Specimen Description BLOOD BLOOD LEFT ARM  Final   Special Requests   Final    BOTTLES DRAWN AEROBIC AND ANAEROBIC Blood Culture adequate volume   Culture   Final    NO GROWTH 3 DAYS Performed at [facility], [address], [place], [place] [address]    Report Status PENDING  Incomplete  MRSA Next Gen by PCR, Nasal     Status: None   Collection Time: 07/17/23  1:09 AM   Specimen: Nasal Mucosa; Nasal Swab  Result Value Ref Range Status   MRSA by PCR Next Gen NOT DETECTED NOT DETECTED Final    Comment: (NOTE) The GeneXpert MRSA Assay (FDA approved for NASAL specimens only), is one component of a comprehensive MRSA colonization surveillance program. It is not intended to diagnose MRSA infection nor to guide or monitor  treatment for MRSA infections. Test performance is not FDA approved in patients less than 66 years old. Performed at [facility], [address] [place], [place] [address]    *Note: Due to a large number of results and/or encounters for the requested time period, some results have not been displayed. A complete set of results can be found in Results Review.    Labs: CBC: Recent Labs  Lab 07/12/23 1220 07/16/23 1836 07/17/23 0109 07/18/23 0528 07/18/23 1633 07/19/23 0504  WBC 12.1* 24.2* 22.8* 13.7*  --   --   NEUTROABS  --  21.8*  --   --   --   --   HGB 9.8* 8.6* 7.8* 7.5* 7.3* 7.9*  HCT 33.0* 28.1* 26.4* 24.4*  --   --   MCV 69.6* 68.9* 71.2* 68.5*  --   --   PLT 554* 575* 508* 513*  --   --    Basic Metabolic Panel: Recent Labs  Lab 07/12/23 1220 07/16/23 1836 07/17/23 0109 07/18/23 0528  NA 135  131* 135 135  K 3.5 3.6 3.4* 3.7  CL 95* 97* 104 103  CO2 27 25 26 25   GLUCOSE 113* 271* 120* 122*  BUN 10 12 9 8   CREATININE 0.92 0.93 0.71 0.56  CALCIUM 10.3* 9.2 8.7* 9.1  MG  --   --   --  2.1   Liver Function Tests: Recent Labs  Lab 07/12/23 1220 07/16/23 1836 07/17/23 0109  AST 11 19 13*  ALT 8 10 10   ALKPHOS  --  51 45  BILITOT 0.3 0.3 0.4  PROT 7.1 7.0 6.5  ALBUMIN  --  3.3* 2.9*   CBG: No results for input(s): "GLUCAP" in the last 168 hours.  Discharge time spent: greater than 30 minutes.  Signed: Marrion Coy, MD [facility] 07/19/2023

## 2023-07-19 NOTE — TOC Transition Note (Addendum)
Transition of Care [facility]) - CM/SW Discharge Note   Patient Details  Name: Erika [name] MRN: [id number] Date of Birth: 1971-09-29  Transition of Care [facility]) CM/SW Contact:  Darolyn Rua, LCSW Phone Number: 07/19/2023, 10:12 AM   Clinical Narrative:     Patient to discharge home with home health [facility] informed of discharge today, requested hh orders from MD.  Morrie Sheldon with [facility] has emailed order for NIV, this has been signed by MD and MD has uploaded note in discharge summary to reflect need for NIV. Morrie Sheldon with [facility] reports patient to be set up with NIV today upon discharge home.    No further discharge needs identified at this time.   Final next level of care: Home w Home Health Services Barriers to Discharge: No Barriers Identified   Patient Goals and CMS Choice CMS Medicare.gov Compare Post Acute Care list provided to:: Patient Choice offered to / list presented to : Patient  Discharge Placement                         Discharge Plan and Services Additional resources added to the After Visit Summary for       Post Acute Care Choice: Durable Medical Equipment            DME Agency: [facility]                  Social Determinants of Health (SDOH) Interventions SDOH Screenings   Food Insecurity: No Food Insecurity (07/17/2023)  Housing: Patient Unable To Answer (07/17/2023)  Transportation Needs: No Transportation Needs (07/17/2023)  Utilities: Not At Risk (07/17/2023)  Recent Concern: Utilities - At Risk (06/13/2023)  Depression (PHQ2-9): High Risk (07/12/2023)  Tobacco Use: Medium Risk (07/16/2023)     Readmission Risk Interventions    07/17/2023    2:19 PM 12/24/2021   12:23 PM 11/23/2021    1:18 PM  Readmission Risk Prevention Plan  Transportation Screening Complete Complete Complete  PCP or Specialist Appt within 3-5 Days Complete    HRI or Home Care Consult Complete    Social Work Consult for Recovery  Care Planning/Counseling Complete    Palliative Care Screening Complete    Medication Review Oceanographer) Referral to Pharmacy Complete Complete  PCP or Specialist appointment within 3-5 days of discharge  Complete Complete  HRI or Home Care Consult  Complete Complete  SW Recovery Care/Counseling Consult   Complete  Palliative Care Screening   Not Applicable  Skilled Nursing Facility   Complete

## 2023-07-21 DIAGNOSIS — J449 Chronic obstructive pulmonary disease, unspecified: Secondary | ICD-10-CM | POA: Diagnosis not present

## 2023-07-22 ENCOUNTER — Telehealth: Payer: Self-pay | Admitting: *Deleted

## 2023-07-22 NOTE — Transitions of Care (Post Inpatient/ED Visit) (Signed)
07/22/2023  Name: Erika [name] MRN: [id number] DOB: 01-03-1971  Today's TOC FU Call Status: Today's TOC FU Call Status:: Successful TOC FU Call Competed TOC FU Call Complete Date: 07/22/23  Transition Care Management Follow-up Telephone Call Date of Discharge: 07/22/23 Discharge Facility: [facility]) Type of Discharge: Inpatient Admission Primary Inpatient Discharge Diagnosis:: Syncope and collapse How have you been since you were released from the hospital?: Better Any questions or concerns?: No  Items Reviewed: Did you receive and understand the discharge instructions provided?: Yes Medications obtained,verified, and reconciled?: Yes (Medications Reviewed) Any new allergies since your discharge?: No Dietary orders reviewed?: No Do you have support at home?: Yes People in Home: spouse Name of Support/Comfort Primary Source: Mtt  Medications Reviewed Today: Medications Reviewed Today     Reviewed by Luella Cook, RN (Case Manager) on 07/22/23 at 1211  Med List Status: <None>   Medication Order Taking? Sig Documenting Provider Last Dose Status Informant  acetaminophen (TYLENOL) 325 MG tablet [id number] Yes Take 1-2 tablets (325-650 mg total) by mouth every 4 (four) hours as needed for mild pain. Jacquelynn Cree, PA-C Taking Active Spouse/Significant Other  albuterol (PROVENTIL) (2.5 MG/3ML) 0.083% nebulizer solution [id number] Yes Take 3 mLs (2.5 mg total) by nebulization every 4 (four) hours as needed for wheezing or shortness of breath. Enedina Finner, MD Taking Active Spouse/Significant Other  amoxicillin-clavulanate (AUGMENTIN) 875-125 MG tablet [id number] Yes Take 1 tablet by mouth 2 (two) times daily for 5 days. Marrion Coy, MD Taking Active   chlorproMAZINE (THORAZINE) 25 MG tablet [id number] Yes Take 3 tablets (75 mg total) by mouth at bedtime. Cleotis Nipper, MD Taking Active Spouse/Significant Other  clonazePAM (KLONOPIN) 0.5 MG tablet  [id number] Yes Take 0.5 tablets (0.25 mg total) by mouth 2 (two) times daily. Take 1/2 tab twice daily. No dissolving tablets. Cleotis Nipper, MD Taking Active Spouse/Significant Other  clopidogrel (PLAVIX) 75 MG tablet [id number] Yes Take 1 tablet (75 mg total) by mouth daily. Sater, Pearletha Furl, MD Taking Active Spouse/Significant Other  Continuous Glucose Sensor (DEXCOM G7 SENSOR) MISC [id number] Yes APPLY ONE SENSOR TO THE BACK OF YOUR UPPER ARM. REPLACE EVERY 10 DAYS. Eden Emms, NP Taking Active   cyclobenzaprine (FLEXERIL) 10 MG tablet [id number] Yes TAKE ONE TABLET BY MOUTH AT BEDTIME Raulkar, Drema Pry, MD Taking Active Spouse/Significant Other  escitalopram (LEXAPRO) 20 MG tablet [id number] Yes Take 1 tablet (20 mg total) by mouth daily. Cleotis Nipper, MD Taking Active Spouse/Significant Other  Evolocumab [facility] SURECLICK) 140 MG/ML Ivory Broad [id number] Yes Inject 140 mg into the skin as directed. Inject 1 Dose into the skin every 14 (fourteen) days. Furth, Cadence H, PA-C Taking Active Spouse/Significant Other  ferrous sulfate 324 (65 Fe) MG TBEC [id number] Yes Take 1 tablet (324 mg total) by mouth 2 (two) times daily. Iran Ouch, MD Taking Active Spouse/Significant Other  fludrocortisone (FLORINEF) 0.1 MG tablet [id number] Yes Take 1 tablet (0.1 mg total) by mouth daily. Iran Ouch, MD Taking Active Spouse/Significant Other  icosapent Ethyl (VASCEPA) 1 g capsule [id number] Yes Take 2 g by mouth 2 (two) times daily. [provider] Taking Active Spouse/Significant Other  lamoTRIgine (LAMICTAL) 200 MG tablet [id number] Yes Take 1 tablet (200 mg total) by mouth at bedtime. Cleotis Nipper, MD Taking Active Spouse/Significant Other  lamoTRIgine (LAMICTAL) 25 MG tablet [id number] Yes Take 1 tablet (25 mg total) by mouth daily.  Patient taking differently: Take 25 mg by mouth at bedtime.  Cleotis Nipper, MD Taking Active Spouse/Significant Other  metoprolol succinate (TOPROL-XL) 25 MG  24 hr tablet [id number] Yes Take 1 tablet (25 mg total) by mouth daily. Iran Ouch, MD Taking Active Spouse/Significant Other  midodrine (PROAMATINE) 5 MG tablet [id number] Yes Take 1 tablet (5 mg total) by mouth 3 (three) times daily with meals. Enedina Finner, MD Taking Active Spouse/Significant Other  MOUNJARO 7.5 MG/0.5ML Pen [id number] Yes INJECT THE CONTENTS OF ONE PEN UNDER THE SKIN WEEKLY ON THE SAME DAY EACH WEEK Eden Emms, NP Taking Active Spouse/Significant Other  nitroGLYCERIN (NITROSTAT) 0.4 MG SL tablet [id number] Yes Place 1 tablet (0.4 mg total) under the tongue every 5 (five) minutes as needed for chest pain. Iran Ouch, MD Taking Active Spouse/Significant Other  Oxycodone HCl 10 MG TABS [id number] Yes Take 1 tablet (10 mg total) by mouth 2 (two) times daily as needed. Jones Bales, NP Taking Active Spouse/Significant Other  pantoprazole (PROTONIX) 40 MG tablet [id number] Yes TAKE ONE TABLET BY MOUTH ONE TIME DAILY Raulkar, Drema Pry, MD Taking Active Spouse/Significant Other  predniSONE (DELTASONE) 10 MG tablet [id number] Yes Take 4 tablets (40 mg total) by mouth daily for 3 days, THEN 2 tablets (20 mg total) daily for 3 days, THEN 1 tablet (10 mg total) daily for 3 days. Marrion Coy, MD Taking Active   pregabalin (LYRICA) 225 MG capsule [id number] Yes Take 1 capsule (225 mg total) by mouth 2 (two) times daily. Jones Bales, NP Taking Active Spouse/Significant Other  Tiotropium Bromide-Olodaterol (STIOLTO RESPIMAT) 2.5-2.5 MCG/ACT AERS [id number] Yes Inhale 2 puffs into the lungs daily. Glenford Bayley, NP Taking Active Spouse/Significant Other  topiramate (TOPAMAX) 25 MG tablet [id number] Yes Take 1 tablet (25 mg total) by mouth at bedtime. Horton Chin, MD Taking Active Spouse/Significant Other  ULTICARE MINI PEN NEEDLES 31G X 6 MM MISC [id number] Yes USE PEN NEEDLES TWO TIMES DAILY Eden Emms, NP Taking Active Spouse/Significant Other            Home  Care and Equipment/Supplies: Were Home Health Services Ordered?: Yes Name of Home Health Agency:: [facility] Has Agency set up a time to come to your home?: Yes First Home Health Visit Date: 07/24/23 Any new equipment or medical supplies ordered?: Yes Name of Medical supply agency?: [facility] Were you able to get the equipment/medical supplies?: Yes Do you have any questions related to the use of the equipment/supplies?: No  Functional Questionnaire: Do you need assistance with bathing/showering or dressing?: Yes Do you need assistance with meal preparation?: Yes Do you need assistance with eating?: No Do you have difficulty maintaining continence: No Do you need assistance with getting out of bed/getting out of a chair/moving?: No Do you have difficulty managing or taking your medications?: No  Follow up appointments reviewed: PCP Follow-up appointment confirmed?: Yes Date of PCP follow-up appointment?: 07/28/23 Follow-up Provider: Mordecai Maes NP Specialist Hospital Follow-up appointment confirmed?: Yes Date of Specialist follow-up appointment?: 07/30/23 Follow-Up Specialty Provider:: Dr Jayme Cloud Do you need transportation to your follow-up appointment?: No Do you understand care options if your condition(s) worsen?: Yes-patient verbalized understanding  SDOH Interventions Today    Flowsheet Row Most Recent Value  SDOH Interventions   Food Insecurity Interventions Intervention Not Indicated  Housing Interventions Intervention Not Indicated  Transportation Interventions Intervention Not Indicated, Patient Resources (Friends/Family)      Interventions Today    Flowsheet Row Most Recent Value  General Interventions   General Interventions Discussed/Reviewed General Interventions Discussed,  General Interventions Reviewed, Referral to Nurse, Doctor Visits, Durable Medical Equipment (DME)  [referred to Care coordination nurse George Ina appt [phone] 4 PM]  Doctor Visits  Discussed/Reviewed Doctor Visits Discussed, Doctor Visits Reviewed  Durable Medical Equipment (DME) Other  [facility] equipment]  Pharmacy Interventions   Pharmacy Dicussed/Reviewed Pharmacy Topics Discussed      TOC Interventions Today    Flowsheet Row Most Recent Value  TOC Interventions   TOC Interventions Discussed/Reviewed TOC Interventions Discussed, TOC Interventions Reviewed, Arranged PCP follow up within 7 days/Care Guide scheduled       Gean Maidens BSN RN [facility] Care Management [phone]

## 2023-07-23 ENCOUNTER — Other Ambulatory Visit: Payer: Self-pay | Admitting: Physical Medicine and Rehabilitation

## 2023-07-23 ENCOUNTER — Ambulatory Visit: Payer: BC Managed Care – PPO | Attending: Primary Care

## 2023-07-23 ENCOUNTER — Encounter: Payer: Self-pay | Admitting: Physical Medicine and Rehabilitation

## 2023-07-23 ENCOUNTER — Encounter
Payer: BC Managed Care – PPO | Attending: Physical Medicine and Rehabilitation | Admitting: Physical Medicine and Rehabilitation

## 2023-07-23 VITALS — BP 119/72 | HR 66 | Ht 66.0 in | Wt 196.0 lb

## 2023-07-23 DIAGNOSIS — Z79899 Other long term (current) drug therapy: Secondary | ICD-10-CM | POA: Diagnosis not present

## 2023-07-23 DIAGNOSIS — G4701 Insomnia due to medical condition: Secondary | ICD-10-CM | POA: Diagnosis not present

## 2023-07-23 DIAGNOSIS — G894 Chronic pain syndrome: Secondary | ICD-10-CM | POA: Diagnosis not present

## 2023-07-23 DIAGNOSIS — I639 Cerebral infarction, unspecified: Secondary | ICD-10-CM | POA: Insufficient documentation

## 2023-07-23 DIAGNOSIS — J9611 Chronic respiratory failure with hypoxia: Secondary | ICD-10-CM

## 2023-07-23 DIAGNOSIS — Z5181 Encounter for therapeutic drug level monitoring: Secondary | ICD-10-CM | POA: Insufficient documentation

## 2023-07-23 DIAGNOSIS — I951 Orthostatic hypotension: Secondary | ICD-10-CM | POA: Diagnosis not present

## 2023-07-23 DIAGNOSIS — R0602 Shortness of breath: Secondary | ICD-10-CM

## 2023-07-23 LAB — PULMONARY FUNCTION TEST ARMC ONLY
DL/VA % pred: 98 %
DL/VA: 4.16 ml/min/mmHg/L
DLCO unc % pred: 53 %
DLCO unc: 12.38 ml/min/mmHg
FEF 25-75 Post: 0.67 L/sec
FEF 25-75 Pre: 0.83 L/sec
FEF2575-%Change-Post: -19 %
FEF2575-%Pred-Post: 22 %
FEF2575-%Pred-Pre: 27 %
FEV1-%Change-Post: -9 %
FEV1-%Pred-Post: 33 %
FEV1-%Pred-Pre: 37 %
FEV1-Post: 1.04 L
FEV1-Pre: 1.16 L
FEV1FVC-%Change-Post: -6 %
FEV1FVC-%Pred-Pre: 74 %
FEV6-%Change-Post: -3 %
FEV6-%Pred-Post: 48 %
FEV6-%Pred-Pre: 50 %
FEV6-Post: 1.86 L
FEV6-Pre: 1.92 L
FEV6FVC-%Pred-Post: 102 %
FEV6FVC-%Pred-Pre: 102 %
FVC-%Change-Post: -3 %
FVC-%Pred-Post: 47 %
FVC-%Pred-Pre: 48 %
FVC-Post: 1.86 L
FVC-Pre: 1.92 L
Post FEV1/FVC ratio: 56 %
Post FEV6/FVC ratio: 100 %
Pre FEV1/FVC ratio: 60 %
Pre FEV6/FVC Ratio: 100 %
RV % pred: 117 %
RV: 2.25 L
TLC % pred: 69 %
TLC: 3.81 L

## 2023-07-23 MED ORDER — ALBUTEROL SULFATE (2.5 MG/3ML) 0.083% IN NEBU
2.5000 mg | INHALATION_SOLUTION | Freq: Once | RESPIRATORY_TRACT | Status: AC
  Filled 2023-07-23: qty 3

## 2023-07-23 MED ORDER — AMITRIPTYLINE HCL 10 MG PO TABS: 10.0000 mg | ORAL_TABLET | Freq: Every day | ORAL | 1 refills | Status: DC

## 2023-07-23 MED ORDER — OXYCODONE HCL 15 MG PO TABS: 15.0000 mg | ORAL_TABLET | Freq: Two times a day (BID) | ORAL | 0 refills | Status: DC | PRN

## 2023-07-23 NOTE — Progress Notes (Signed)
Subjective:    Patient ID: Erika [name], female    DOB: 05/01/1971, 52 y.o.   MRN: [id number]  HPI   Erika [name] is a 52 year old woman who presents for f/u of CVA and headaches.   1) CVA -She has been doing well at home, but at times feels she is a burden to her husband and family despite his reassurance that she is not -walking from bedroom and bathroom every day  -she walked up to 150 feet yesterday with therapy! Still doing this! -spouse has been able to work at home since this happened.   2) Type 2 DM: -previously sent scripts for for DexCom receiver, sensory, and transmitter- her daughter has this so she is familiar with its use -Her CBGs have recently been between 120 and 130  3) Migraines -she would like to try topamax since Nurtec was denied -failed nerve block -migraines continue to be severe, present every day, despite use of oxycodone and Lyrica. -she has had benefit from Fioricet, though less than with oxycodone, but she also is less confused than with the oxycodone so her husband prefers the Fioricet to the oxycodone. -she would like to restart oxycodone  -she would like to take this at night to help her sleep.  -she prefers oxycodone to Percocet  -she is willing to try Botox -one pill of oxycodone doesn't help -she has been taking Lyrica, too much lyrica causes UTI.  -topamax did not hep -using 100% oxygen 4) Urinary incontinence -having about one episode of urinary incontinence daily, usually at night -discussed pros and cons of Botox.   5) Quadriceps weakness -she had a fall last week when using the bedside commode- her legs buckled. She has had no more falls since then  6) PAD -follows with vascular surgery and although she has PAD in both legs, she is only symptomatic in her right cald  7) Left arm weakness -she has not had any return in strength yet -tens unit has helped  -she would be interested in Vivistim eval  8) Nerve damage in left  leg -has sensation -did nerve study -no pain.  -walking with hemiwalker -daughter and mother-in-law accompany her today -can go to bedroom to den.   9) Sleep apnea -has sleep study tomorrow.   10) Orthostatic hypotension: -her BP meds were stopped  Pain Inventory Average Pain 8 Pain Right Now 8 My pain is constant, dull, and Poking pain  LOCATION OF PAIN  head  BOWEL Number of stools per week: 3 Oral laxative use Yes  Type of laxative miralax   BLADDER normal Bladder incontinence No    Mobility ability to climb steps?  yes do you drive?  no use a wheelchair needs help with transfers Do you have any goals in this area?  yes  Function disabled: date disabled .05/2021 I need assistance with the following:  dressing, bathing, toileting, meal prep, household duties, and shopping  Neuro/Psych weakness tingling trouble walking spasms depression anxiety  Prior Studies Any changes since last visit?  no  Physicians involved in your care No    Family History  Adopted: Yes  Problem Relation Age of Onset   Diabetes Mother    Diabetes Father    Alcohol abuse Father    Heart disease Father    Drug abuse Father    Stroke Sister    Anxiety disorder Sister    Social History   Socioeconomic History   Marital status: Married    Spouse  name: Not on file   Number of children: 1   Years of education: 14   Highest education level: Not on file  Occupational History   Occupation: Disability  Tobacco Use   Smoking status: Former    Current packs/day: 0.00    Average packs/day: 0.5 packs/day for 30.0 years (15.0 ttl pk-yrs)    Types: Cigarettes    Start date: 14    Quit date: 2022    Years since quitting: 2.5    Passive exposure: Never   Smokeless tobacco: Never  Vaping Use   Vaping status: Every Day   Substances: Flavoring  Substance and Sexual Activity   Alcohol use: Not Currently    Alcohol/week: 0.0 standard drinks of alcohol    Comment:  socially   Drug use: No   Sexual activity: Yes    Partners: Male    Birth control/protection: None  Other Topics Concern   Not on file  Social History Narrative   11/21/20   From: [name]   Living: with husband, [name] (2009) and special needs daughter    Work: disability       Family: daughter - Erika [name] (special needs, lives with her) and 2 grown step children - [name] and [name] Client       Enjoys: play with dogs - 2 german Shepard, mut, and blue tick walker mix      Exercise: PAD limits exercise   Diet: diabetic diet and low potassium due to daughter      Safety   Seat belts: Yes    Guns: Yes  and secure   Safe in relationships: Yes    Social Determinants of Health   Financial Resource Strain: Not on file  Food Insecurity: No Food Insecurity (07/22/2023)   Hunger Vital Sign    Worried About Running Out of Food in the Last Year: Never true    Ran Out of Food in the Last Year: Never true  Transportation Needs: No Transportation Needs (07/22/2023)   PRAPARE - Administrator, Civil Service (Medical): No    Lack of Transportation (Non-Medical): No  Physical Activity: Not on file  Stress: Not on file  Social Connections: Not on file   Past Surgical History:  Procedure Laterality Date   ABDOMINAL AORTOGRAM W/LOWER EXTREMITY [name] 10/15/2018   Procedure: ABDOMINAL AORTOGRAM W/LOWER EXTREMITY;  Surgeon: Iran Ouch, MD;  Location: [facility];  Service: Cardiovascular;  Laterality: [name];   ABDOMINAL AORTOGRAM W/LOWER EXTREMITY Bilateral 08/19/2019   Procedure: ABDOMINAL AORTOGRAM W/LOWER EXTREMITY;  Surgeon: Iran Ouch, MD;  Location: [facility];  Service: Cardiovascular;  Laterality: Bilateral;   CARDIAC CATHETERIZATION [name] 06/29/2016   Procedure: Left Heart Cath and Coronary Angiography;  Surgeon: Antonieta Iba, MD;  Location: [facility];  Service: Cardiovascular;  Laterality: [name];   CARDIAC CATHETERIZATION [name] 08/29/2016    Procedure: Left Heart Cath and Cors/Grafts Angiography;  Surgeon: Iran Ouch, MD;  Location: [facility];  Service: Cardiovascular;  Laterality: [name];   CESAREAN SECTION     CHOLECYSTECTOMY     CORONARY ARTERY BYPASS GRAFT [name] 07/06/2016   Procedure: CORONARY ARTERY BYPASS GRAFTING (CABG) x four, using left internal mammary artery and right leg greater saphenous vein harvested endoscopically;  Surgeon: Kerin Perna, MD;  Location: [facility] OR;  Service: Open Heart Surgery;  Laterality: [name];   ENDARTERECTOMY Right 07/06/2016   Procedure: ENDARTERECTOMY CAROTID;  Surgeon: Larina Earthly, MD;  Location: [facility]  OR;  Service: Vascular;  Laterality: Right;   ENDARTERECTOMY Right 04/27/2020   Procedure: REDO OF RIGHT ENDARTERECTOMY CAROTID;  Surgeon: Larina Earthly, MD;  Location: [facility] OR;  Service: Vascular;  Laterality: Right;   LEFT HEART CATH AND CORS/GRAFTS ANGIOGRAPHY [name] 08/24/2020   Procedure: LEFT HEART CATH AND CORS/GRAFTS ANGIOGRAPHY;  Surgeon: Iran Ouch, MD;  Location: [facility];  Service: Cardiovascular;  Laterality: [name];   LEFT HEART CATH AND CORS/GRAFTS ANGIOGRAPHY [name] 01/29/2022   Procedure: LEFT HEART CATH AND CORS/GRAFTS ANGIOGRAPHY;  Surgeon: Iran Ouch, MD;  Location: [facility];  Service: Cardiovascular;  Laterality: [name];   PERIPHERAL VASCULAR CATHETERIZATION [name] 04/18/2016   Procedure: Renal Angiography;  Surgeon: Iran Ouch, MD;  Location: [facility];  Service: Cardiovascular;  Laterality: [name];   PERIPHERAL VASCULAR INTERVENTION Left 10/15/2018   Procedure: PERIPHERAL VASCULAR INTERVENTION;  Surgeon: Iran Ouch, MD;  Location: [facility];  Service: Cardiovascular;  Laterality: Left;  Left superficial femoral   TEE WITHOUT CARDIOVERSION [name] 07/06/2016   Procedure: TRANSESOPHAGEAL ECHOCARDIOGRAM (TEE);  Surgeon: Kerin Perna, MD;  [facility];  Service: Open Heart Surgery;  Laterality: [name];   TONSILLECTOMY     Past Medical  History:  Diagnosis Date   Acute respiratory failure with hypoxia (HCC) 06/24/2021   Arthralgia of temporomandibular joint    CAD, multiple vessel    a. 06/2016 Cath: ostLM 40%, ostLAD 40%, pLAD 95%, ost-pLCx 60%, pLCx 95%, mLCx 60%, mRCA 95%, D2 50%, LVSF nl;  b. 07/2016 CABG x 4 (LIMA->LAD, VG->Diag, VG->OM, VG->RCA); c. 08/2016 Cath: 3VD w/ 4/4 patent grafts. LAD distal to LIMA has diff dzs->Med rx; d. 08/2020 Cath: 4/4 patent grafts, native 3VD. EF 55-65%-->Med Rx.   Carotid arterial disease (HCC)    a. 07/2016 s/p R CEA; b. 02/2021 U/S: RICA 40-59%, LICA 1-39%.   Cerebrovascular disease    Clotting disorder (HCC)    Complex sleep apnea syndrome 10/03/2022   Depression    Diastolic dysfunction    a. 06/2016 Echo: EF 50-55%, mild inf wall HK, GR1DD, mild MR, RV sys fxn nl, mildly dilated LA, PASP nl; b. 06/2021 Echo: EF 60-65%, no rwma. Nl RV fxn.   Essential hypertension    Essential hypertension, malignant 06/28/2016   Fatty liver disease, nonalcoholic 2016   History of blood transfusion    with heart surgery   History of MI (myocardial infarction) (July 2017) 08/02/2020   HLD (hyperlipidemia)    HTN (hypertension), malignant 10/20/2013   Labile hypertension    a. prior renal ngiogram negative for RAS in 03/2016; b. catecholamines and metanephrines normal, mildly elevated renin with normal aldosterone and normal ratio in 02/2016   Mild tobacco abuse in early remission 06/28/2016   Myocardial infarction [facility]) 2017   Obesity    PAD (peripheral artery disease) (HCC)    a. 09/2018 s/p L SFA stenting; b. 07/2019 Periph Angio: Patent m/d L SFA stent w/ 100% L SFA distal to stent. L AT 100d, L Peroneal diff dzs-->Med Rx; c. 02/2021 ABIs: stable @ 0.61 on R and 0.46 on L.   PTSD (post-traumatic stress disorder)    Reflux gastritis 07/01/2023   Stroke (HCC)    Tobacco abuse    Type 2 diabetes mellitus (HCC) 12/2015   BP 119/72   Pulse 66   Ht 5\' 6"  (1.676 m)   Wt 196 lb (88.9 kg)   SpO2 91%    BMI 31.64 kg/m  Opioid Risk Score:   Fall Risk Score:  `1  Depression screen [facility] 2/9     07/12/2023   11:39 AM 05/07/2023   11:56 AM 04/18/2023   10:01 AM 04/11/2023   10:38 AM 03/05/2023    1:21 PM 03/05/2023   10:13 AM 12/10/2022    8:51 AM  Depression screen PHQ 2/9  Decreased Interest 3 1 3 1 3  0 0  Down, Depressed, Hopeless 2 1 3 1 3 3  0  PHQ - 2 Score 5 2 6 2 6 3  0  Altered sleeping 3     3   Tired, decreased energy 3     3   Change in appetite 3     0   Feeling bad or failure about yourself  3     3   Trouble concentrating 3     1   Moving slowly or fidgety/restless 1     1   Suicidal thoughts 0     0   PHQ-9 Score 21     14   Difficult doing work/chores Very difficult     Somewhat difficult      Review of Systems  Constitutional: Negative.   HENT: Negative.    Eyes: Negative.   Respiratory: Negative.    Cardiovascular: Negative.   Gastrointestinal:        Bowel control  Endocrine: Negative.   Genitourinary:        Bladder control  Musculoskeletal:  Positive for gait problem.  Skin: Negative.   Allergic/Immunologic: Negative.   Neurological:  Positive for weakness.  Hematological:  Bruises/bleeds easily.       Plavix  Psychiatric/Behavioral:  Positive for confusion and dysphoric mood. The patient is nervous/anxious.   All other systems reviewed and are negative.      Objective:   Physical Exam Gen: no distress, normal appearing HEENT: oral mucosa pink and moist, NCAT Cardio: Reg rate Chest: normal effort, normal rate of breathing Abd: soft, non-distended Ext: no edema Psych: pleasant, normal affect Skin: intact Neuro: Alert and oriented x3      Assessment & Plan:  Mrs. Gildon is a very pleasant 52 year old woman who presents for f/u of fall post-CVA, and chronic headaches.   1) CVA: Encouraged increasing walking distance every day -referred to neurology for follow-up, discussed that she was prescribed Vascepa yesterday by Dr. Epimenio Foot.   -continue home therapies -continue to push yourself every day  2) Cluster Headaches -Prescribed Zynex Nexwave and heating/cooling blanket, and cervical traction device -increase oxycodone to 15mg  BID prn -failed multiple medications -prescribed Nurtec but this was denied -prescribed topamax, discussed that this can also result in weight loss -present every day -discussed Botox, she is wary of getting needles in her face but will consider.  -UDS and pain contract signed -continue lyrica 225mg  BID, discussed we will try to wean next visit.  -made goal to replace soft drinks with electrolyte water -will restart oxycodone, need new urine sample and UDS today -refilled Fioricet.  -failed botox -amitriptyline 10mg  HS ordered  3) HTN: -BP is 119/72 -continue midodrine -Advised checking BP daily at home and logging results to bring into follow-up appointment with PCP and myself. -Reviewed BP meds today.  -Advised regarding healthy foods that can help lower blood pressure and provided with a list: 1) citrus foods- high in vitamins and minerals 2) salmon and other fatty fish - reduces inflammation and oxylipins 3) swiss chard (leafy green)- high level of nitrates 4) pumpkin  seeds- one of the best natural sources of magnesium 5) Beans and lentils- high in fiber, magnesium, and potassium 6) Berries- high in flavonoids 7) Amaranth (whole grain, can be cooked similarly to rice and oats)- high in magnesium and fiber 8) Pistachios- even more effective at reducing BP than other nuts 9) Carrots- high in phenolic compounds that relax blood vessels and reduce inflammation 10) Celery- contain phthalides that relax tissues of arterial walls 11) Tomatoes- can also improve cholesterol and reduce risk of heart disease 12) Broccoli- good source of magnesium, calcium, and potassium 13) Greek yogurt: high in potassium and calcium 14) Herbs and spices: Celery seed, cilantro, saffron, lemongrass, black  cumin, ginseng, cinnamon, cardamom, sweet basil, and ginger 15) Chia and flax seeds- also help to lower cholesterol and blood sugar 16) Beets- high levels of nitrates that relax blood vessels  17) spinach and bananas- high in potassium  -Provided lise of supplements that can help with hypertension:  1) magnesium: one high quality brand is Bioptemizers since it contains all 7 types of magnesium, otherwise over the counter magnesium gluconate 400mg  is a good option 2) B vitamins 3) vitamin D 4) potassium 5) CoQ10 6) L-arginine 7) Vitamin C 8) Beetroot -Educated that goal BP is 120/80. -Made goal to incorporate some of the above foods into diet.    4) Diabetes: -discussed that CBGs 130s-140s -continue monjaro -check CBGs daily, log, and bring log to follow-up appointment -try to incorporate into your diet some of the following foods which are good for diabetes: 1) cinnamon- imitates effects of insulin, increasing glucose transport into cells ([place] or [place] cinnamon is best, least processed) 2) nuts- can slow down the blood sugar response of carbohydrate rich foods 3) oatmeal- contains and anti-inflammatory compound avenanthramide 4) whole-milk yogurt (best types are no sugar, [place] yogurt, or goat/sheep yogurt) 5) beans- high in protein, fiber, and vitamins, low glycemic index 6) broccoli- great source of vitamin A and C 7) quinoa- higher in protein and fiber than other grains 8) spinach- high in vitamin A, fiber, and protein 9) olive oil- reduces glucose levels, LDL, and triglycerides 10) salmon- excellent amount of omega-3-fatty acids 11) walnuts- rich in antioxidants 12) apples- high in fiber and quercetin 13) carrots- highly nutritious with low impact on blood sugar 14) eggs- improve HDL (good cholesterol), high in protein, keep you satiated 15) turmeric: improves blood sugars, cardiovascular disease, and protects kidney health 16) garlic: improves blood sugar, blood  pressure, pain 17) tomatoes: highly nutritious with low impact on blood sugar   5) left foot drop: continue AFO ordered from [facility]ar  6) Left sided post-stroke shoulder pain --Discussed Sprint PNS system as an option of pain treatment via neuromodulation. Provided following link for patient to learn more about the system: https://www.sprtherapeutics.com/. Explained mechanism of activating A beta fibers which function in touch, pressure, and vibration, to inhibit the sensations of A delta and C fibers which are responsible for pain transmission.   -continue therapy -E-stim ordered due to flaccidity, atrophy, pain- discussed where e-stim should be placed  7) Quadriceps weakness contributing to fall- recommended seated quadriceps strengthening exercises daily. Continue PT and OT  8) Insomnia: resolved, discontinue trazodone. -amitriptyline 10mg  HS prescribed  9) Multiple falls: resolved  10) PAD -Discussed Qutenza as an option for neuropathic pain control. Discussed that this is a capsaicin patch, stronger than capsaicin cream. Discussed that it is currently approved for diabetic peripheral neuropathy and post-herpetic neuralgia, but that it has also shown benefit in treating  other forms of neuropathy. Provided patient with link to site to learn more about the patch: https://www.clark.biz/. Discussed that the patch would be placed in office and benefits usually last 3 months. Discussed that unintended exposure to capsaicin can cause severe irritation of eyes, mucous membranes, respiratory tract, and skin, but that Qutenza is a local treatment and does not have the systemic side effects of other nerve medications. Discussed that there may be pain, itching, erythema, and decreased sensory function associated with the application of Qutenza. Side effects usually subside within 1 week. A cold pack of analgesic medications can help with these side effects. Blood pressure can also be increased due to pain  associated with administration of the patch.   11) Dermatitis -provided referral to dermatology   -resolved

## 2023-07-24 ENCOUNTER — Telehealth: Payer: Self-pay | Admitting: Nurse Practitioner

## 2023-07-24 DIAGNOSIS — I110 Hypertensive heart disease with heart failure: Secondary | ICD-10-CM | POA: Diagnosis not present

## 2023-07-24 DIAGNOSIS — J9611 Chronic respiratory failure with hypoxia: Secondary | ICD-10-CM | POA: Diagnosis not present

## 2023-07-24 DIAGNOSIS — D509 Iron deficiency anemia, unspecified: Secondary | ICD-10-CM | POA: Diagnosis not present

## 2023-07-24 DIAGNOSIS — D696 Thrombocytopenia, unspecified: Secondary | ICD-10-CM | POA: Diagnosis not present

## 2023-07-24 DIAGNOSIS — J690 Pneumonitis due to inhalation of food and vomit: Secondary | ICD-10-CM | POA: Diagnosis not present

## 2023-07-24 DIAGNOSIS — I951 Orthostatic hypotension: Secondary | ICD-10-CM | POA: Diagnosis not present

## 2023-07-24 DIAGNOSIS — E669 Obesity, unspecified: Secondary | ICD-10-CM | POA: Diagnosis not present

## 2023-07-24 DIAGNOSIS — Z6830 Body mass index (BMI) 30.0-30.9, adult: Secondary | ICD-10-CM | POA: Diagnosis not present

## 2023-07-24 DIAGNOSIS — I5032 Chronic diastolic (congestive) heart failure: Secondary | ICD-10-CM | POA: Diagnosis not present

## 2023-07-24 DIAGNOSIS — Z9981 Dependence on supplemental oxygen: Secondary | ICD-10-CM | POA: Diagnosis not present

## 2023-07-24 DIAGNOSIS — E1169 Type 2 diabetes mellitus with other specified complication: Secondary | ICD-10-CM | POA: Diagnosis not present

## 2023-07-24 DIAGNOSIS — I69354 Hemiplegia and hemiparesis following cerebral infarction affecting left non-dominant side: Secondary | ICD-10-CM | POA: Diagnosis not present

## 2023-07-24 DIAGNOSIS — K219 Gastro-esophageal reflux disease without esophagitis: Secondary | ICD-10-CM | POA: Diagnosis not present

## 2023-07-24 DIAGNOSIS — Z7985 Long-term (current) use of injectable non-insulin antidiabetic drugs: Secondary | ICD-10-CM | POA: Diagnosis not present

## 2023-07-24 DIAGNOSIS — J441 Chronic obstructive pulmonary disease with (acute) exacerbation: Secondary | ICD-10-CM | POA: Diagnosis not present

## 2023-07-24 DIAGNOSIS — E876 Hypokalemia: Secondary | ICD-10-CM | POA: Diagnosis not present

## 2023-07-24 NOTE — Telephone Encounter (Signed)
Noted. I will follow along and sign the home health orders and such as needed

## 2023-07-24 NOTE — Telephone Encounter (Signed)
Durwin Nora, nurse from [facility] called to leave message for patient's pcp. States they will be admittingher for home health, physical therapy and occupational therapy services soon, wanted to ensure that Susy Frizzle would be following her care since he is listed as the provider that is signing orders. Caller also wanted to document a level 2 drug interaction between chlorpromazine 25mg  and escitalopram 20 mg, both medications are prescribed by a different provider. Informed Durwin Nora of this and advised I would send this to [facility]. Durwin Nora an be reached at [phone] if needed

## 2023-07-25 ENCOUNTER — Telehealth: Payer: Self-pay | Admitting: *Deleted

## 2023-07-25 ENCOUNTER — Telehealth: Payer: Self-pay | Admitting: Nurse Practitioner

## 2023-07-25 DIAGNOSIS — J441 Chronic obstructive pulmonary disease with (acute) exacerbation: Secondary | ICD-10-CM | POA: Diagnosis not present

## 2023-07-25 DIAGNOSIS — Z7985 Long-term (current) use of injectable non-insulin antidiabetic drugs: Secondary | ICD-10-CM | POA: Diagnosis not present

## 2023-07-25 DIAGNOSIS — E669 Obesity, unspecified: Secondary | ICD-10-CM | POA: Diagnosis not present

## 2023-07-25 DIAGNOSIS — Z6830 Body mass index (BMI) 30.0-30.9, adult: Secondary | ICD-10-CM | POA: Diagnosis not present

## 2023-07-25 DIAGNOSIS — D696 Thrombocytopenia, unspecified: Secondary | ICD-10-CM | POA: Diagnosis not present

## 2023-07-25 DIAGNOSIS — J9611 Chronic respiratory failure with hypoxia: Secondary | ICD-10-CM | POA: Diagnosis not present

## 2023-07-25 DIAGNOSIS — I110 Hypertensive heart disease with heart failure: Secondary | ICD-10-CM | POA: Diagnosis not present

## 2023-07-25 DIAGNOSIS — D509 Iron deficiency anemia, unspecified: Secondary | ICD-10-CM | POA: Diagnosis not present

## 2023-07-25 DIAGNOSIS — K219 Gastro-esophageal reflux disease without esophagitis: Secondary | ICD-10-CM | POA: Diagnosis not present

## 2023-07-25 DIAGNOSIS — I951 Orthostatic hypotension: Secondary | ICD-10-CM | POA: Diagnosis not present

## 2023-07-25 DIAGNOSIS — Z9981 Dependence on supplemental oxygen: Secondary | ICD-10-CM | POA: Diagnosis not present

## 2023-07-25 DIAGNOSIS — E1169 Type 2 diabetes mellitus with other specified complication: Secondary | ICD-10-CM | POA: Diagnosis not present

## 2023-07-25 DIAGNOSIS — I69354 Hemiplegia and hemiparesis following cerebral infarction affecting left non-dominant side: Secondary | ICD-10-CM | POA: Diagnosis not present

## 2023-07-25 DIAGNOSIS — E876 Hypokalemia: Secondary | ICD-10-CM | POA: Diagnosis not present

## 2023-07-25 DIAGNOSIS — J690 Pneumonitis due to inhalation of food and vomit: Secondary | ICD-10-CM | POA: Diagnosis not present

## 2023-07-25 DIAGNOSIS — I5032 Chronic diastolic (congestive) heart failure: Secondary | ICD-10-CM | POA: Diagnosis not present

## 2023-07-25 NOTE — Telephone Encounter (Signed)
Verbal orders ok

## 2023-07-25 NOTE — Telephone Encounter (Signed)
Called verbal orders given. Will call if any questions.

## 2023-07-25 NOTE — Telephone Encounter (Signed)
Home Health verbal orders Caller Name: Romilda Garret  Agency Name: [facility]   Callback number: [phone]  Requesting OT/PT/Skilled nursing/Social Work/Speech: Physical Therapy   Reason: Strength, gait and balance training   Frequency: 1wk 8  Please forward to [facility] pool or providers CMA

## 2023-07-25 NOTE — Progress Notes (Signed)
  Care Coordination   Note   07/25/2023 Name: Erika [name] MRN: [id number] DOB: 02-21-1971  Erika [name] is a 52 y.o. year old female who sees Cable, Genene Churn, NP for primary care. I reached out to Idelle Crouch by phone today to offer care coordination services.  Ms. Dejongh was given information about Care Coordination services today including:   The Care Coordination services include support from the care team which includes your Nurse Coordinator, Clinical Social Worker, or Pharmacist.  The Care Coordination team is here to help remove barriers to the health concerns and goals most important to you. Care Coordination services are voluntary, and the patient may decline or stop services at any time by request to their care team member.   Care Coordination Consent Status: Patient agreed to services and verbal consent obtained.   Follow up plan:  Telephone appointment with care coordination team member scheduled for:  08/06/2023  Encounter Outcome:  Pt. Scheduled  Burman Nieves, CCMA Care Coordination Care Guide Direct Dial: [phone]

## 2023-07-26 ENCOUNTER — Ambulatory Visit (INDEPENDENT_AMBULATORY_CARE_PROVIDER_SITE_OTHER): Payer: BC Managed Care – PPO | Admitting: Nurse Practitioner

## 2023-07-26 ENCOUNTER — Telehealth: Payer: Self-pay | Admitting: Nurse Practitioner

## 2023-07-26 ENCOUNTER — Encounter: Payer: Self-pay | Admitting: Nurse Practitioner

## 2023-07-26 VITALS — BP 100/60 | HR 61 | Temp 97.9°F | Ht 66.0 in | Wt 197.6 lb

## 2023-07-26 DIAGNOSIS — E1169 Type 2 diabetes mellitus with other specified complication: Secondary | ICD-10-CM

## 2023-07-26 DIAGNOSIS — R7989 Other specified abnormal findings of blood chemistry: Secondary | ICD-10-CM | POA: Diagnosis not present

## 2023-07-26 DIAGNOSIS — J9611 Chronic respiratory failure with hypoxia: Secondary | ICD-10-CM | POA: Diagnosis not present

## 2023-07-26 DIAGNOSIS — Z09 Encounter for follow-up examination after completed treatment for conditions other than malignant neoplasm: Secondary | ICD-10-CM | POA: Diagnosis not present

## 2023-07-26 DIAGNOSIS — I9589 Other hypotension: Secondary | ICD-10-CM

## 2023-07-26 DIAGNOSIS — Z794 Long term (current) use of insulin: Secondary | ICD-10-CM

## 2023-07-26 LAB — CBC
HCT: 31.1 % — ABNORMAL LOW (ref 35.0–45.0)
Hemoglobin: 9.5 g/dL — ABNORMAL LOW (ref 11.7–15.5)
MCH: 21.5 pg — ABNORMAL LOW (ref 27.0–33.0)
MCHC: 30.5 g/dL — ABNORMAL LOW (ref 32.0–36.0)
MCV: 70.5 fL — ABNORMAL LOW (ref 80.0–100.0)
MPV: 10.3 fL (ref 7.5–12.5)
Platelets: 421 10*3/uL — ABNORMAL HIGH (ref 140–400)
RBC: 4.41 10*6/uL (ref 3.80–5.10)
RDW: 19.4 % — ABNORMAL HIGH (ref 11.0–15.0)
WBC: 9.6 10*3/uL (ref 3.8–10.8)

## 2023-07-26 NOTE — Patient Instructions (Signed)
Nice to see you today I will be in touch with the labs once I have reviewed them Follow up with me in 3 months sooner if you need me

## 2023-07-26 NOTE — Assessment & Plan Note (Signed)
Patient has anemia could be multifactorial.  Patient was given iron infusion in the hospital pending CBC along with iron panels

## 2023-07-26 NOTE — Progress Notes (Signed)
Established Patient Office Visit  Subjective   Patient ID: Erika [name], female    DOB: Nov 17, 1971  Age: 52 y.o. MRN: [id number]  Chief Complaint  Patient presents with   Hospitalization Follow-up    Pt states she feels better. BP still drops when standing. Feels dizzy.  No more falls or passing out.     HPI   Hospital follow up: Patient was in the hospital 07/16/2023 she was admitted with pneumonia.  Patient was discharged on 07/19/2023.  Her oxygen was increased to 4 L for now she is resting probable pulmonology 2 weeks after discharge.  Patient was on 6 L of oxygen in the hospital but down to 4 L.  Also completed IV antibiotics and a 7-day course of Augmentin along with a prednisone taper.  She was also placed back on Florinef.  Also put back on midodrine per family report cardiology in the hospital discontinued the medication.  Patient is doing very thickened liquids.  No cough no fever shortness of breath at baseline.  She supposed to wear 4 L of oxygen in office today without O2 on O2 96% O2 saturation.  OT and PT  states that they have come once each. PT for 8 weeks and OT for 4 weeks  DM2: CGM and has been running in the 140s over the past 90 days  States GMI is 6.8 She is on mounjaro and will do levemir 28 units at night as needed.  Patient has had some low glucose readings.  Did encourage patient not to use Levemir to have a dose of Mounjaro.  She can do 10 units nightly as needed glucose greater than 250.   Review of Systems  Constitutional:  Negative for chills and fever.  Respiratory:  Positive for shortness of breath.   Cardiovascular:  Negative for chest pain.  Neurological:  Positive for dizziness. Negative for headaches.      Objective:     BP 100/60   Pulse 61   Temp 97.9 F (36.6 C) (Temporal)   Ht 5\' 6"  (1.676 m)   Wt 197 lb 9.6 oz (89.6 kg)   SpO2 98% Comment: 4l Lucas  BMI 31.89 kg/m  BP Readings from Last 3 Encounters:  07/26/23 100/60   07/23/23 119/72  07/19/23 124/60   Wt Readings from Last 3 Encounters:  07/26/23 197 lb 9.6 oz (89.6 kg)  07/23/23 196 lb (88.9 kg)  07/16/23 181 lb (82.1 kg)      Physical Exam Vitals and nursing note reviewed.  Constitutional:      Appearance: Normal appearance.  Cardiovascular:     Rate and Rhythm: Normal rate and regular rhythm.     Heart sounds: Normal heart sounds.  Pulmonary:     Effort: Pulmonary effort is normal.     Breath sounds: Rales (Bilateral lower lobes) present.  Neurological:     Mental Status: She is alert.      No results found for any visits on 07/26/23.    The ASCVD Risk score (Arnett DK, et al., 2019) failed to calculate for the following reasons:   The patient has a prior MI or stroke diagnosis    Assessment & Plan:   Problem List Items Addressed This Visit       Cardiovascular and Mediastinum   Chronic hypotension    History of same has been followed by cardiology.  Patient on Florinef continue medication as prescribed continue staying hydrated drinking appropriately taking food and fluids  Respiratory   Chronic hypoxemic respiratory failure [facility])    Patient follows with pulmonology.  Supposed to be on 4 L of continuous oxygen.  Recent PFTs and had a CT scan of the chest scheduled later this month.  Continue following up pulmonology as recommended with oxygen as prescribed        Endocrine   Type 2 diabetes mellitus with other specified complication [facility])    Patient currently Mounjaro 7.5 mg once a week.  She will use Levemir 28 units nightly as needed.  Did decrease that to 10 units as needed with glucoses greater than 250.  Continue Mounjaro pending A1c today      Relevant Orders   CBC   Comprehensive metabolic panel   Hemoglobin A1c     Other   Abnormal CBC    Patient has anemia could be multifactorial.  Patient was given iron infusion in the hospital pending CBC along with iron panels      Relevant Orders   CBC    Iron, TIBC and Ferritin Panel   Hospital discharge follow-up - Primary    Did review most recent hospital admission along with imaging and labs.      Relevant Orders   CBC   Comprehensive metabolic panel    Return in about 3 months (around 10/26/2023) for DM recheck.    Erika [name], Erika [name]

## 2023-07-26 NOTE — Telephone Encounter (Signed)
Left voicemail for Home Health care to call the office back.

## 2023-07-26 NOTE — Assessment & Plan Note (Signed)
Patient currently Mounjaro 7.5 mg once a week.  She will use Levemir 28 units nightly as needed.  Did decrease that to 10 units as needed with glucoses greater than 250.  Continue Mounjaro pending A1c today

## 2023-07-26 NOTE — Telephone Encounter (Signed)
Home Health verbal orders Caller Name:Stephanie Agency Name: [facility] number: [phone]  Requesting OT/PT/Skilled nursing/Social Work/Speech:OT  Reason:increase ability with transfers and ADL'S  Frequency:1wk4 1wk every other week 2 visits  Please forward to [facility] pool or providers CMA

## 2023-07-26 NOTE — Assessment & Plan Note (Signed)
History of same has been followed by cardiology.  Patient on Florinef continue medication as prescribed continue staying hydrated drinking appropriately taking food and fluids

## 2023-07-26 NOTE — Assessment & Plan Note (Signed)
Did review most recent hospital admission along with imaging and labs.

## 2023-07-26 NOTE — Assessment & Plan Note (Signed)
Patient follows with pulmonology.  Supposed to be on 4 L of continuous oxygen.  Recent PFTs and had a CT scan of the chest scheduled later this month.  Continue following up pulmonology as recommended with oxygen as prescribed

## 2023-07-26 NOTE — Telephone Encounter (Signed)
Verbal orders ok

## 2023-07-27 DIAGNOSIS — G43909 Migraine, unspecified, not intractable, without status migrainosus: Secondary | ICD-10-CM | POA: Diagnosis not present

## 2023-07-27 DIAGNOSIS — G44009 Cluster headache syndrome, unspecified, not intractable: Secondary | ICD-10-CM | POA: Diagnosis not present

## 2023-07-27 DIAGNOSIS — M542 Cervicalgia: Secondary | ICD-10-CM | POA: Diagnosis not present

## 2023-07-29 ENCOUNTER — Ambulatory Visit: Payer: Self-pay

## 2023-07-29 NOTE — Patient Instructions (Signed)
Visit Information  Thank you for taking time to visit with me today. Please don't hesitate to contact me if I can be of assistance to you.   Following are the goals we discussed today:   Goals Addressed             This Visit's Progress    post hospital progression / improvement and management of chronic health conditions.       Interventions Today    Flowsheet Row Most Recent Value  Chronic Disease   Chronic disease during today's visit Chronic Obstructive Pulmonary Disease (COPD), Congestive Heart Failure (CHF), Other  [syncope / collapse, hypotension, status post pneumonia]  General Interventions   General Interventions Discussed/Reviewed General Interventions Reviewed, Doctor Visits  Doctor Visits Discussed/Reviewed Doctor Visits Reviewed  Durable Medical Equipment (DME) Other  [medical equipment reviewed.  Inquired if additional medical equipment needed.]  Exercise Interventions   Exercise Discussed/Reviewed Physical Activity  [discussed patients current physical activity]  Education Interventions   Education Provided Provided Education  [facility] action plan discussed.  Assessed baseline weight, O2 saturation, Bp readings. Assessed for additional assistance in the home for patients care.  Confirmed patient receving home health services and Oxygen/ vent Bypap provider.]  Mental Health Interventions   Mental Health Discussed/Reviewed Other  [Confirmed patient/ spouse aware patient has upcoming visit with Child psychotherapist. Discussed caregiver stress]  Pharmacy Interventions   Pharmacy Dicussed/Reviewed Pharmacy Topics Reviewed  [medications / allergies reviewed.  Discussed importance of medication compliance.]  Safety Interventions   Safety Discussed/Reviewed Fall Risk, Safety Discussed  [education article on fall prevention in the home sent to patient in Mychart.  Confirmed spouse aware of how to access Mychart.]              Our next appointment is by telephone on 08/30/23 at 3  pm  Please call the care guide team at [phone] if you need to cancel or reschedule your appointment.   If you are experiencing a Mental Health or Behavioral Health Crisis or need someone to talk to, please call the Suicide and Crisis Lifeline: 988 call 1-800-273-TALK (toll free, 24 hour hotline)  Patient verbalizes understanding of instructions and care plan provided today and agrees to view in MyChart. Active MyChart status and patient understanding of how to access instructions and care plan via MyChart confirmed with patient.     George Ina RN,BSN,CCM [facility] Care Coordination [phone] direct line

## 2023-07-29 NOTE — Patient Outreach (Signed)
Care Coordination   Initial Visit Note   07/29/2023 Name: Erika [name] MRN: [id number] DOB: 1971/12/20  Erika [name] is a 52 y.o. year old female who sees Cable, Genene Churn, NP for primary care. I spoke with  Idelle Crouch and spouse/ DPR Frederica Kuster by phone today.  What matters to the patients health and wellness today?  Per chart review patient hospitalized from 07/16/23 to 07/19/23 due to pneumonia/ sepsis/ syncope and collapse/ hypotension.  Patient states she is feeling better since returning home from hospital.  Spouse states he manages patients health information/ care.  He reports patient has vascular dementia. Spouse states patient receiving home health PT/ OT from [facility] health.  He states she is receiving oxygen/ vent/ bypap services with [facility].  Spouse states patient continues to have weakness and syncope.  He denies patient passing out or experiencing fall since discharge from hospital..   He reports patients blood pressure ranges 88/55 which drops lower when standing, O2 level 90-94 and most recent weight is 192 lbs.  Spouse states he is unable to currently weigh patient due to her weakness and inability to stand on scale. Per chart review patient had post hospital follow up with primary care provider on 07/26/23.  He states she is scheduled for cardiology follow up on 9/24 and and echocardiogram and CT 07/2023.  Spouse states patient has swallowing issues status post stroke 2 years ago.  He states she requires thickened liquids.  He states she is unable to use left arm and has minimal usage of left leg. Spouse states he has help from his mother and daughter regarding patients care and transport to provider visits.  Spouse states he also has a daughter that lives with them who is autistic and on dialysis.    Goals Addressed             This Visit's Progress    post hospital progression / improvement and management of chronic health conditions.        Interventions Today    Flowsheet Row Most Recent Value  Chronic Disease   Chronic disease during today's visit Chronic Obstructive Pulmonary Disease (COPD), Congestive Heart Failure (CHF), Other  [syncope / collapse, hypotension, status post pneumonia]  General Interventions   General Interventions Discussed/Reviewed General Interventions Reviewed, Doctor Visits  Doctor Visits Discussed/Reviewed Doctor Visits Reviewed  [reviewed upcoming provider visits.  Advised to keep follow up visits with providers.]  Durable Medical Equipment (DME) Other  [medical equipment reviewed.  Inquired if additional medical equipment needed.]  Exercise Interventions   Exercise Discussed/Reviewed Physical Activity  [discussed patients current physical activity]  Education Interventions   Education Provided Provided Education  [facility] action plan discussed.  Assessed baseline weight, O2 saturation, Bp readings. Assessed for additional assistance in the home for patients care.  Confirmed patient receving home health services and Oxygen/ vent Bypap provider.]  Provided Verbal Education On Other  [Advised to notify provider for any new or ongoing symptoms.]  Mental Health Interventions   Mental Health Discussed/Reviewed Other  [Confirmed patient/ spouse aware patient has upcoming visit with Child psychotherapist. Discussed caregiver stress]  Pharmacy Interventions   Pharmacy Dicussed/Reviewed Pharmacy Topics Reviewed  [medications / allergies reviewed.  Discussed importance of medication compliance.]  Safety Interventions   Safety Discussed/Reviewed Fall Risk, Safety Discussed  [education article on fall prevention in the home sent to patient in Mychart.  Confirmed spouse aware of how to access Mychart.]  SDOH assessments and interventions completed:  Yes  SDOH Interventions Today    Flowsheet Row Most Recent Value  SDOH Interventions   Food Insecurity Interventions Intervention Not Indicated  Housing  Interventions Intervention Not Indicated  Transportation Interventions Intervention Not Indicated        Care Coordination Interventions:  Yes, provided   Follow up plan: Follow up call scheduled for 08/30/23    Encounter Outcome:  Pt. Visit Completed   George Ina RN,BSN,CCM [facility] Care Coordination [phone] direct line

## 2023-07-30 ENCOUNTER — Inpatient Hospital Stay
Admission: EM | Admit: 2023-07-30 | Discharge: 2023-08-02 | DRG: 871 | Disposition: A | Discharge status: Home Health | Payer: BC Managed Care – PPO | Attending: Osteopathic Medicine | Admitting: Osteopathic Medicine

## 2023-07-30 ENCOUNTER — Other Ambulatory Visit: Payer: Self-pay

## 2023-07-30 ENCOUNTER — Emergency Department: Payer: BC Managed Care – PPO

## 2023-07-30 ENCOUNTER — Ambulatory Visit: Payer: BC Managed Care – PPO | Admitting: Pulmonary Disease

## 2023-07-30 DIAGNOSIS — Z818 Family history of other mental and behavioral disorders: Secondary | ICD-10-CM

## 2023-07-30 DIAGNOSIS — Z515 Encounter for palliative care: Secondary | ICD-10-CM

## 2023-07-30 DIAGNOSIS — I2510 Atherosclerotic heart disease of native coronary artery without angina pectoris: Secondary | ICD-10-CM | POA: Diagnosis present

## 2023-07-30 DIAGNOSIS — Z9981 Dependence on supplemental oxygen: Secondary | ICD-10-CM

## 2023-07-30 DIAGNOSIS — Z823 Family history of stroke: Secondary | ICD-10-CM

## 2023-07-30 DIAGNOSIS — I252 Old myocardial infarction: Secondary | ICD-10-CM

## 2023-07-30 DIAGNOSIS — I5032 Chronic diastolic (congestive) heart failure: Secondary | ICD-10-CM | POA: Diagnosis present

## 2023-07-30 DIAGNOSIS — F32A Depression, unspecified: Secondary | ICD-10-CM | POA: Diagnosis present

## 2023-07-30 DIAGNOSIS — Z7985 Long-term (current) use of injectable non-insulin antidiabetic drugs: Secondary | ICD-10-CM

## 2023-07-30 DIAGNOSIS — G9389 Other specified disorders of brain: Secondary | ICD-10-CM | POA: Diagnosis present

## 2023-07-30 DIAGNOSIS — R7989 Other specified abnormal findings of blood chemistry: Secondary | ICD-10-CM

## 2023-07-30 DIAGNOSIS — K760 Fatty (change of) liver, not elsewhere classified: Secondary | ICD-10-CM | POA: Diagnosis present

## 2023-07-30 DIAGNOSIS — E782 Mixed hyperlipidemia: Secondary | ICD-10-CM | POA: Diagnosis not present

## 2023-07-30 DIAGNOSIS — G9341 Metabolic encephalopathy: Secondary | ICD-10-CM | POA: Diagnosis not present

## 2023-07-30 DIAGNOSIS — J189 Pneumonia, unspecified organism: Secondary | ICD-10-CM

## 2023-07-30 DIAGNOSIS — G4733 Obstructive sleep apnea (adult) (pediatric): Secondary | ICD-10-CM | POA: Diagnosis present

## 2023-07-30 DIAGNOSIS — Z1152 Encounter for screening for COVID-19: Secondary | ICD-10-CM

## 2023-07-30 DIAGNOSIS — Z66 Do not resuscitate: Secondary | ICD-10-CM | POA: Diagnosis not present

## 2023-07-30 DIAGNOSIS — R6521 Severe sepsis with septic shock: Secondary | ICD-10-CM | POA: Diagnosis present

## 2023-07-30 DIAGNOSIS — J9690 Respiratory failure, unspecified, unspecified whether with hypoxia or hypercapnia: Secondary | ICD-10-CM | POA: Diagnosis not present

## 2023-07-30 DIAGNOSIS — R0902 Hypoxemia: Principal | ICD-10-CM

## 2023-07-30 DIAGNOSIS — J9611 Chronic respiratory failure with hypoxia: Secondary | ICD-10-CM | POA: Diagnosis not present

## 2023-07-30 DIAGNOSIS — I21A1 Myocardial infarction type 2: Secondary | ICD-10-CM | POA: Diagnosis not present

## 2023-07-30 DIAGNOSIS — R4182 Altered mental status, unspecified: Secondary | ICD-10-CM | POA: Diagnosis not present

## 2023-07-30 DIAGNOSIS — F4310 Post-traumatic stress disorder, unspecified: Secondary | ICD-10-CM | POA: Diagnosis present

## 2023-07-30 DIAGNOSIS — R918 Other nonspecific abnormal finding of lung field: Secondary | ICD-10-CM | POA: Diagnosis not present

## 2023-07-30 DIAGNOSIS — J690 Pneumonitis due to inhalation of food and vomit: Secondary | ICD-10-CM | POA: Diagnosis present

## 2023-07-30 DIAGNOSIS — E1151 Type 2 diabetes mellitus with diabetic peripheral angiopathy without gangrene: Secondary | ICD-10-CM | POA: Diagnosis not present

## 2023-07-30 DIAGNOSIS — I959 Hypotension, unspecified: Secondary | ICD-10-CM | POA: Diagnosis not present

## 2023-07-30 DIAGNOSIS — J9621 Acute and chronic respiratory failure with hypoxia: Secondary | ICD-10-CM | POA: Diagnosis present

## 2023-07-30 DIAGNOSIS — Z6830 Body mass index (BMI) 30.0-30.9, adult: Secondary | ICD-10-CM

## 2023-07-30 DIAGNOSIS — E669 Obesity, unspecified: Secondary | ICD-10-CM | POA: Diagnosis not present

## 2023-07-30 DIAGNOSIS — I110 Hypertensive heart disease with heart failure: Secondary | ICD-10-CM | POA: Diagnosis present

## 2023-07-30 DIAGNOSIS — R069 Unspecified abnormalities of breathing: Secondary | ICD-10-CM | POA: Diagnosis not present

## 2023-07-30 DIAGNOSIS — Z7189 Other specified counseling: Secondary | ICD-10-CM | POA: Diagnosis not present

## 2023-07-30 DIAGNOSIS — R0602 Shortness of breath: Secondary | ICD-10-CM

## 2023-07-30 DIAGNOSIS — Z7902 Long term (current) use of antithrombotics/antiplatelets: Secondary | ICD-10-CM

## 2023-07-30 DIAGNOSIS — Z9049 Acquired absence of other specified parts of digestive tract: Secondary | ICD-10-CM

## 2023-07-30 DIAGNOSIS — G459 Transient cerebral ischemic attack, unspecified: Secondary | ICD-10-CM | POA: Diagnosis not present

## 2023-07-30 DIAGNOSIS — J986 Disorders of diaphragm: Secondary | ICD-10-CM | POA: Diagnosis present

## 2023-07-30 DIAGNOSIS — Z833 Family history of diabetes mellitus: Secondary | ICD-10-CM

## 2023-07-30 DIAGNOSIS — I69354 Hemiplegia and hemiparesis following cerebral infarction affecting left non-dominant side: Secondary | ICD-10-CM

## 2023-07-30 DIAGNOSIS — Z8249 Family history of ischemic heart disease and other diseases of the circulatory system: Secondary | ICD-10-CM

## 2023-07-30 DIAGNOSIS — L89152 Pressure ulcer of sacral region, stage 2: Secondary | ICD-10-CM | POA: Diagnosis present

## 2023-07-30 DIAGNOSIS — D649 Anemia, unspecified: Secondary | ICD-10-CM | POA: Diagnosis present

## 2023-07-30 DIAGNOSIS — Z87891 Personal history of nicotine dependence: Secondary | ICD-10-CM

## 2023-07-30 DIAGNOSIS — I2489 Other forms of acute ischemic heart disease: Secondary | ICD-10-CM | POA: Diagnosis not present

## 2023-07-30 DIAGNOSIS — Z79899 Other long term (current) drug therapy: Secondary | ICD-10-CM | POA: Diagnosis not present

## 2023-07-30 DIAGNOSIS — I700 Atherosclerosis of aorta: Secondary | ICD-10-CM | POA: Diagnosis not present

## 2023-07-30 DIAGNOSIS — Z811 Family history of alcohol abuse and dependence: Secondary | ICD-10-CM

## 2023-07-30 DIAGNOSIS — A419 Sepsis, unspecified organism: Secondary | ICD-10-CM | POA: Diagnosis not present

## 2023-07-30 DIAGNOSIS — Z951 Presence of aortocoronary bypass graft: Secondary | ICD-10-CM

## 2023-07-30 DIAGNOSIS — Z813 Family history of other psychoactive substance abuse and dependence: Secondary | ICD-10-CM

## 2023-07-30 DIAGNOSIS — Z7952 Long term (current) use of systemic steroids: Secondary | ICD-10-CM

## 2023-07-30 LAB — URINALYSIS, W/ REFLEX TO CULTURE (INFECTION SUSPECTED)
Bilirubin Urine: NEGATIVE
Glucose, UA: NEGATIVE mg/dL
Hgb urine dipstick: NEGATIVE
Ketones, ur: NEGATIVE mg/dL
Leukocytes,Ua: NEGATIVE
Nitrite: NEGATIVE
Protein, ur: NEGATIVE mg/dL
Specific Gravity, Urine: 1.023 (ref 1.005–1.030)
pH: 6 (ref 5.0–8.0)

## 2023-07-30 LAB — COMPREHENSIVE METABOLIC PANEL
ALT: 16 U/L (ref 0–44)
AST: 30 U/L (ref 15–41)
Albumin: 3.4 g/dL — ABNORMAL LOW (ref 3.5–5.0)
Alkaline Phosphatase: 65 U/L (ref 38–126)
Anion gap: 9 (ref 5–15)
BUN: 12 mg/dL (ref 6–20)
CO2: 31 mmol/L (ref 22–32)
Calcium: 9.4 mg/dL (ref 8.9–10.3)
Chloride: 96 mmol/L — ABNORMAL LOW (ref 98–111)
Creatinine, Ser: 0.88 mg/dL (ref 0.44–1.00)
GFR, Estimated: >60 mL/min (ref >60)
Glucose, Bld: 114 mg/dL — ABNORMAL HIGH (ref 70–99)
Potassium: 4.4 mmol/L (ref 3.5–5.1)
Sodium: 136 mmol/L (ref 135–145)
Total Bilirubin: 0.9 mg/dL (ref 0.3–1.2)
Total Protein: 6.5 g/dL (ref 6.5–8.1)

## 2023-07-30 LAB — BLOOD GAS, VENOUS
Acid-Base Excess: 6.3 mmol/L — ABNORMAL HIGH (ref 0.0–2.0)
Bicarbonate: 33.1 mmol/L — ABNORMAL HIGH (ref 20.0–28.0)
O2 Saturation: 77.7 %
Patient temperature: 37
pCO2, Ven: 56 mmHg (ref 44–60)
pH, Ven: 7.38 (ref 7.25–7.43)
pO2, Ven: 42 mmHg (ref 32–45)

## 2023-07-30 LAB — CBC
HCT: 31.2 % — ABNORMAL LOW (ref 36.0–46.0)
Hemoglobin: 9.1 g/dL — ABNORMAL LOW (ref 12.0–15.0)
MCH: 21.7 pg — ABNORMAL LOW (ref 26.0–34.0)
MCHC: 29.2 g/dL — ABNORMAL LOW (ref 30.0–36.0)
MCV: 74.5 fL — ABNORMAL LOW (ref 80.0–100.0)
Platelets: 374 10*3/uL (ref 150–400)
RBC: 4.19 MIL/uL (ref 3.87–5.11)
RDW: 23.1 % — ABNORMAL HIGH (ref 11.5–15.5)
WBC: 21.1 10*3/uL — ABNORMAL HIGH (ref 4.0–10.5)
nRBC: 0 % (ref 0.0–0.2)

## 2023-07-30 LAB — TROPONIN I (HIGH SENSITIVITY)
Troponin I (High Sensitivity): 136 ng/L (ref <18)
Troponin I (High Sensitivity): 450 ng/L (ref <18)
Troponin I (High Sensitivity): 991 ng/L (ref <18)
Troponin I (High Sensitivity): 918 ng/L (ref <18)

## 2023-07-30 LAB — PROTIME-INR
INR: 1.3 — ABNORMAL HIGH (ref 0.8–1.2)
Prothrombin Time: 16 seconds — ABNORMAL HIGH (ref 11.4–15.2)

## 2023-07-30 LAB — GLUCOSE, CAPILLARY: Glucose-Capillary: 135 mg/dL — ABNORMAL HIGH (ref 70–99)

## 2023-07-30 LAB — BASIC METABOLIC PANEL
Anion gap: 8 (ref 5–15)
BUN: 12 mg/dL (ref 6–20)
CO2: 30 mmol/L (ref 22–32)
Calcium: 9.2 mg/dL (ref 8.9–10.3)
Chloride: 97 mmol/L — ABNORMAL LOW (ref 98–111)
Creatinine, Ser: 0.98 mg/dL (ref 0.44–1.00)
GFR, Estimated: >60 mL/min (ref >60)
Glucose, Bld: 128 mg/dL — ABNORMAL HIGH (ref 70–99)
Potassium: 4.1 mmol/L (ref 3.5–5.1)
Sodium: 135 mmol/L (ref 135–145)

## 2023-07-30 LAB — POC URINE PREG, ED: Preg Test, Ur: NEGATIVE

## 2023-07-30 LAB — BRAIN NATRIURETIC PEPTIDE: B Natriuretic Peptide: 90.1 pg/mL (ref 0.0–100.0)

## 2023-07-30 LAB — APTT: aPTT: 41 seconds — ABNORMAL HIGH (ref 24–36)

## 2023-07-30 LAB — LACTIC ACID, PLASMA
Lactic Acid, Venous: 1.3 mmol/L (ref 0.5–1.9)
Lactic Acid, Venous: 1 mmol/L (ref 0.5–1.9)

## 2023-07-30 LAB — MRSA NEXT GEN BY PCR, NASAL: MRSA by PCR Next Gen: NOT DETECTED

## 2023-07-30 LAB — SARS CORONAVIRUS 2 BY RT PCR: SARS Coronavirus 2 by RT PCR: NEGATIVE

## 2023-07-30 MED ORDER — BLISTEX MEDICATED EX OINT
TOPICAL_OINTMENT | CUTANEOUS | Status: DC | PRN
  Filled 2023-07-30: qty 6.3

## 2023-07-30 MED ORDER — CARMEX CLASSIC LIP BALM EX OINT: TOPICAL_OINTMENT | CUTANEOUS | Status: DC | PRN

## 2023-07-30 MED ORDER — HEPARIN (PORCINE) 25000 UT/250ML-% IV SOLN
1700.0000 [IU]/h | INTRAVENOUS | Status: AC
  Administered 2023-07-30: 1000 [IU]/h via INTRAVENOUS
  Administered 2023-07-31: 1500 [IU]/h via INTRAVENOUS
  Administered 2023-08-01 (×2): 1700 [IU]/h via INTRAVENOUS
  Filled 2023-07-30 (×4): qty 250

## 2023-07-30 MED ORDER — CHLORHEXIDINE GLUCONATE CLOTH 2 % EX PADS
6.0000 | MEDICATED_PAD | Freq: Every day | CUTANEOUS | Status: DC
  Administered 2023-07-31: 6 via TOPICAL

## 2023-07-30 MED ORDER — VANCOMYCIN HCL IN DEXTROSE 1-5 GM/200ML-% IV SOLN
1000.0000 mg | Freq: Two times a day (BID) | INTRAVENOUS | Status: DC
  Administered 2023-07-30 – 2023-07-31 (×2): 1000 mg via INTRAVENOUS
  Filled 2023-07-30 (×2): qty 200

## 2023-07-30 MED ORDER — NOREPINEPHRINE 4 MG/250ML-% IV SOLN: 2.0000 ug/min | INTRAVENOUS | Status: DC

## 2023-07-30 MED ORDER — HEPARIN BOLUS VIA INFUSION
4000.0000 [IU] | Freq: Once | INTRAVENOUS | Status: AC
  Administered 2023-07-30: 4000 [IU] via INTRAVENOUS
  Filled 2023-07-30: qty 4000

## 2023-07-30 MED ORDER — METHYLPREDNISOLONE SODIUM SUCC 125 MG IJ SOLR
80.0000 mg | Freq: Every day | INTRAMUSCULAR | Status: DC
  Administered 2023-07-30 – 2023-08-02 (×4): 80 mg via INTRAVENOUS
  Filled 2023-07-30 (×4): qty 2

## 2023-07-30 MED ORDER — IPRATROPIUM-ALBUTEROL 0.5-2.5 (3) MG/3ML IN SOLN
3.0000 mL | Freq: Once | RESPIRATORY_TRACT | Status: AC
  Administered 2023-07-30: 3 mL via RESPIRATORY_TRACT
  Filled 2023-07-30: qty 3

## 2023-07-30 MED ORDER — LACTATED RINGERS IV BOLUS
500.0000 mL | Freq: Once | INTRAVENOUS | Status: AC
  Administered 2023-07-30: 500 mL via INTRAVENOUS

## 2023-07-30 MED ORDER — VANCOMYCIN HCL 750 MG/150ML IV SOLN
750.0000 mg | Freq: Once | INTRAVENOUS | Status: AC
  Administered 2023-07-30: 750 mg via INTRAVENOUS
  Filled 2023-07-30: qty 150

## 2023-07-30 MED ORDER — NOREPINEPHRINE 4 MG/250ML-% IV SOLN
0.0000 ug/min | INTRAVENOUS | Status: DC
  Administered 2023-07-30: 2 ug/min via INTRAVENOUS
  Filled 2023-07-30: qty 250

## 2023-07-30 MED ORDER — SODIUM CHLORIDE 0.9 % IV SOLN
2.0000 g | Freq: Three times a day (TID) | INTRAVENOUS | Status: DC
  Administered 2023-07-30 – 2023-08-02 (×8): 2 g via INTRAVENOUS
  Filled 2023-07-30 (×9): qty 12.5

## 2023-07-30 MED ORDER — LACTATED RINGERS IV BOLUS
1000.0000 mL | Freq: Once | INTRAVENOUS | Status: AC
  Administered 2023-07-30: 1000 mL via INTRAVENOUS

## 2023-07-30 MED ORDER — ORAL CARE MOUTH RINSE: 15.0000 mL | OROMUCOSAL | Status: DC | PRN

## 2023-07-30 MED ORDER — REVEFENACIN 175 MCG/3ML IN SOLN
175.0000 ug | Freq: Every day | RESPIRATORY_TRACT | Status: DC
  Administered 2023-07-30 – 2023-08-02 (×4): 175 ug via RESPIRATORY_TRACT
  Filled 2023-07-30 (×5): qty 3

## 2023-07-30 MED ORDER — IOHEXOL 350 MG/ML SOLN
75.0000 mL | Freq: Once | INTRAVENOUS | Status: AC | PRN
  Administered 2023-07-30: 75 mL via INTRAVENOUS

## 2023-07-30 MED ORDER — SODIUM CHLORIDE 0.9 % IV SOLN
2.0000 g | Freq: Once | INTRAVENOUS | Status: AC
  Administered 2023-07-30: 2 g via INTRAVENOUS
  Filled 2023-07-30: qty 12.5

## 2023-07-30 MED ORDER — ACETAMINOPHEN 325 MG PO TABS
650.0000 mg | ORAL_TABLET | ORAL | Status: DC | PRN
  Administered 2023-07-30 – 2023-07-31 (×2): 650 mg via ORAL
  Filled 2023-07-30 (×2): qty 2

## 2023-07-30 MED ORDER — ASPIRIN 81 MG PO TBEC
81.0000 mg | DELAYED_RELEASE_TABLET | Freq: Every day | ORAL | Status: AC
  Administered 2023-07-31 – 2023-08-01 (×2): 81 mg via ORAL
  Filled 2023-07-30 (×2): qty 1

## 2023-07-30 MED ORDER — ORAL CARE MOUTH RINSE
15.0000 mL | OROMUCOSAL | Status: DC
  Administered 2023-07-30 – 2023-07-31 (×2): 15 mL via OROMUCOSAL

## 2023-07-30 MED ORDER — VANCOMYCIN HCL IN DEXTROSE 1-5 GM/200ML-% IV SOLN
1000.0000 mg | Freq: Once | INTRAVENOUS | Status: AC
  Administered 2023-07-30: 1000 mg via INTRAVENOUS
  Filled 2023-07-30: qty 200

## 2023-07-30 MED ORDER — ARFORMOTEROL TARTRATE 15 MCG/2ML IN NEBU
15.0000 ug | INHALATION_SOLUTION | Freq: Two times a day (BID) | RESPIRATORY_TRACT | Status: DC
  Administered 2023-07-30 – 2023-08-02 (×7): 15 ug via RESPIRATORY_TRACT
  Filled 2023-07-30 (×10): qty 2

## 2023-07-30 MED ORDER — BUDESONIDE 0.5 MG/2ML IN SUSP
0.5000 mg | Freq: Two times a day (BID) | RESPIRATORY_TRACT | Status: DC
  Administered 2023-07-30 – 2023-08-02 (×7): 0.5 mg via RESPIRATORY_TRACT
  Filled 2023-07-30 (×7): qty 2

## 2023-07-30 NOTE — Progress Notes (Deleted)
NAME:  Erika [name], MRN:  [id number], DOB:  05-20-1971, LOS: 0 ADMISSION DATE:  07/30/2023,  CHIEF COMPLAINT:  SOB, AMS  Brief Pt Description / Synopsis:  Erika [name] is 52 yo female who has a PMHx of chronic respiratory failure on 4L home O2 via [place], HTN, HLD, CABG & Endarterectomy s/p MI (2017), CVA w/ left-sided deficit, obesity, OSA, PAD, PNA, PTSD, Reflux gastritis (06/2023), T2DM who presents to [facility] ED for SOB and AMS while being treated for bilat PNA with Augmentin and Prednisone taper following discharge from [facility] on 07/26.  History of Present Illness:  Erika [name] is 52 yo female who has a PMHx of chronic respiratory failure on 4L home O2 via [place], HTN, HLD, CABG & Endarterectomy s/p MI (2017), CVA w/ left-sided deficit, obesity, OSA, PAD, PNA, PTSD, Reflux gastritis (06/2023), T2DM who presents to [facility] ED for SOB and AMS while being treated for bilat PNA with Augmentin and Prednisone taper following discharge from [facility] on 07/26. In the ED, patient was started on vancomycin and cefepime for empiric HAP coverage. Patient began getting hypotensive and, despite 1L of IVF, patient was started on Levophed.  On interview, patient was A&Ox4 in bed on 6L O2 via [place] with her husband, a former paramedic, in the room. Per patient's husband, patient was found at home by him with her BiPAP off and her oxygen level was about 30%. Patient's husband increased her machine to 10L and the "highest I could get it [patient's pulse ox] is was about 67%". Patient's husband also noted her mental status was poor and did not improve with oxygen at this time. Patient is currently full code, but she was previously DNR/DNI changed due to organ donor status for family member. At home, patient is normally on 4L home O2 via [place] and [place] at night. Patient normally walks with a hemi walker due to left-sided deficits s/p CVA.  ED Course: Initial Vital Signs: Today's Vitals   07/30/23 1245 07/30/23 1255  07/30/23 1304 07/30/23 1340  BP: (!) 89/54 (!) 109/51    Pulse: 62 78    Resp: 19 12    Temp:   99.9 F (37.7 C)   TempSrc:   Oral   SpO2: 92% 91%    Weight:    88.3 kg  Height:    5\' 7"  (1.702 m)  PainSc:    7    Body mass index is 30.49 kg/m.  Significant Labs: CBC: WBC 21.1, Hgb 9.1, Plt 374 BNP: 90.1 Trop: 136 >> 450 LA: 1.0 VBG: pH 7.38, pCO2 56, Bicarb 33.1, Acid-Base excess 6.3  Imaging: CXR Impression: Prominent bilateral interstitial opacities, which are somewhat basal predominant. Findings could represent pulmonary edema or multifocal infection.  CT Head wo Contrast Impression: 1. No acute intracranial abnormality. 2. Chronic cortical encephalomalacia in the right parietal and occipital lobes, and moderate brainstem atrophy and/or Wallerian degeneration - greater on the right.  CT Angio Chest PE Impression: 1. Negative for acute pulmonary embolus. 2. Multi lobar and dependent bilateral pneumonia, progressed since 07/16/2023 CTA. Consider sequelae of aspiration in this clinical setting. 3.  Aortic Atherosclerosis (ICD10-I70.0).  Prior CABG.  Medications Administered:  -LR 1L -Levophed -Vancomycin -Cefepime -Duo-Neb  Pertinent  Medical History  -Chronic respiratory failure -HTN -HLD -CABG & Endarterectomy s/p MI (2017) -CVA w/ left-sided deficit -Obesity -OSA -PAD -PNA -PTSD -Reflux gastritis (06/2023) -T2DM   Micro Data:  Coronavirus >> Neg BC x2 >> PEND MRSA >> PEND  Antimicrobials:  Antibiotics Given (last 72 hours)     Date/Time Action Medication Dose Rate   07/30/23 1021 New Bag/Given   ceFEPIme (MAXIPIME) 2 g in sodium chloride 0.9 % 100 mL IVPB 2 g 200 mL/hr   07/30/23 1053 New Bag/Given   vancomycin (VANCOCIN) IVPB 1000 mg/200 mL premix 1,000 mg 200 mL/hr   07/30/23 1217 New Bag/Given   vancomycin (VANCOREADY) IVPB 750 mg/150 mL 750 mg 150 mL/hr       Significant Hospital Events: Including procedures, antibiotic start and stop  dates in addition to other pertinent events   Pt admitted to ICU, initiated new abx, started levophed  Interim History / Subjective:  -Patient admitted to ICU -Currently on 6L O2 via [place] -New abx (Vancomycin and Cefepime) ordered for empiric HAP coverage -Levophed started for hypotension   Objective   Blood pressure (!) 109/51, pulse 78, temperature 99.9 F (37.7 C), temperature source Oral, resp. rate 12, SpO2 91%.        Intake/Output Summary (Last 24 hours) at 07/30/2023 1315 Last data filed at 07/30/2023 1303 Gross per 24 hour  Intake 1100 ml  Output --  Net 1100 ml   There were no vitals filed for this visit.  Examination: General: Ill-appearing, comfortable on 6L via [place] HENT: PERRL Lungs: +Rhonchi,  Cardiovascular: S1, S2, RR Abdomen: soft, nontender Extremities: Left-sided paralysis (s/p previous CVA) Neuro: A&Ox4 GU: External catheter in place   Assessment & Plan:  Erika [name] is a 52yo female with an extensive cardiac history, chronic respiratory failure, CVA w/ left-sided deficit, obesity, OSA, PTSD, Reflux gastritis (06/2023), and T2DM who presented to [facility] ED for evaluation of SOB & AMS while being treat for bilateral PNA and 3 previous hospitalizations in last ~7 weeks requested admission to PCCM for acute respiratory failure and hypotension in setting of likely HAP.  #Acute Respiratory Failure CXR: Prominent bilateral interstitial opacities, which are somewhat basal predominant. Findings could represent pulmonary edema or multifocal infection. CT Angio Chest PE: No acute PE. Multi lobar and dependent bilateral pneumonia, progressed since 07/16/2023 CTA. Consider sequelae of aspiration in this clinical setting. Aortic Atherosclerosis & Prior CABG. -Supplemental O2 via [place] as needed to maintain O2 sats 90% -Follow intermittent Chest X-ray & ABG as needed -Bronchodilators & Pulmicort nebs -IV Steroids -Pulmonary toilet as able -Continue antibiotics as  recommended -Coronavirus >> Neg -MRSA >> Pend -Increase level of respiratory management if patient's status decompensates  #Septic Shock, Source: Worsening PNA vs ?HAP #HFpEF Echo (07/17/23): LVEF 60-65%. Mild LV hypertrophy. LV diastolic parameters consistent w/ Grade I diastolic dysfunction. CT Head wo Contrast: No acute intracranial abn. Chronic cortical encephalomalacia in right parietal and occipital lobes, and moderate brainstem atrophy and/or Wallerian degeneration - greater on the right -Monitor fever curve -Continuous cardiac monitoring -Maintain MAP >55 -IV fluids -Vasopressors as needed to maintain MAP goal -Trend lactic acid until normalized -Trend HS Troponin until peaked -Trend WBC's & Procalcitonin -Diuresis as BP and renal function permits -Follow cultures as above -Continue empiric Vancomycin & Cefepime, pending cultures & sensitivities  -BC x2 >> Pend  #Chronic cortical encephalomalacia likely s/p previous CVA #Acute Metabolic Encephalopathy -Continue monitor mental status -Patient's left-sided deficits chronic -Treatment of metabolic derangements as outlined above -Provide supportive care -Promote normal sleep/wake cycle and family presence -Avoid sedating medications as able  Demand Ischemia Family requesting Cardiology evaluation  #T2DM -CBG's q4h; Target range of 140 to 180 -SSI -Follow ICU Hypo/Hyperglycemia protocol  Renal -Monitor I&O's / urinary output -Follow BMP -Ensure  adequate renal perfusion -Avoid nephrotoxic agents as able -Replace electrolytes as indicated ~ Pharmacy following for assistance with electrolyte replacement  Heme -Monitor for S/Sx of bleeding -Trend CBC -Transfuse for Hgb <7 -DVT prophylaxis: SCDs & Plavix   Best Practice (right click and "Reselect all SmartList Selections" daily)   Diet/type: NPO DVT prophylaxis: SCD, subcutaneous heparin GI prophylaxis: PPI Lines: [name] Foley:  [name] Code Status:  full  code  Labs   CBC: Recent Labs  Lab 07/26/23 1628 07/30/23 0838  WBC 9.6 21.1*  HGB 9.5* 9.1*  HCT 31.1* 31.2*  MCV 70.5* 74.5*  PLT 421* 374    Basic Metabolic Panel: Recent Labs  Lab 07/26/23 1628 07/30/23 0838 07/30/23 0912  NA 139 135 136  K 4.0 4.1 4.4  CL 100 97* 96*  CO2 32 30 31  GLUCOSE 146* 128* 114*  BUN 10 12 12   CREATININE 0.74 0.98 0.88  CALCIUM 9.5 9.2 9.4   GFR: Estimated Creatinine Clearance: 87.2 mL/min (by C-G formula based on SCr of 0.88 mg/dL). Recent Labs  Lab 07/26/23 1628 07/30/23 0838 07/30/23 0913 07/30/23 0941  WBC 9.6 21.1*  --   --   LATICACIDVEN  --   --  1.3 1.0    Liver Function Tests: Recent Labs  Lab 07/26/23 1628 07/30/23 0912  AST 20 30  ALT 22 16  ALKPHOS  --  65  BILITOT 0.2 0.9  PROT 6.1 6.5  ALBUMIN  --  3.4*   No results for input(s): "LIPASE", "AMYLASE" in the last 168 hours. No results for input(s): "AMMONIA" in the last 168 hours.  ABG    Component Value Date/Time   PHART 7.392 07/03/2021 1152   PCO2ART 45.1 07/03/2021 1152   PO2ART 55.8 (L) 07/03/2021 1152   HCO3 33.1 (H) 07/30/2023 0927   TCO2 29 08/19/2019 1145   ACIDBASEDEF 1.0 07/06/2016 2254   O2SAT 77.7 07/30/2023 0927     Coagulation Profile: Recent Labs  Lab 07/30/23 0912 07/30/23 1110  INR SPECIMEN CLOTTED 1.3*    Cardiac Enzymes: No results for input(s): "CKTOTAL", "CKMB", "CKMBINDEX", "TROPONINI" in the last 168 hours.  HbA1C: Hemoglobin A1C  Date/Time Value Ref Range Status  03/05/2023 09:50 AM 8.2 (A) 4.0 - 5.6 % Final   HbA1c POC (<> result, manual entry)  Date/Time Value Ref Range Status  12/25/2018 08:25 AM 9.9 4.0 - 5.6 % Final   Hgb A1c MFr Bld  Date/Time Value Ref Range Status  07/26/2023 04:28 PM 6.2 (H) <5.7 % of total Hgb Final    Comment:    For someone without known diabetes, a hemoglobin  A1c value between 5.7% and 6.4% is consistent with prediabetes and should be confirmed with a  follow-up  test. . For someone with known diabetes, a value <7% indicates that their diabetes is well controlled. A1c targets should be individualized based on duration of diabetes, age, comorbid conditions, and other considerations. . This assay result is consistent with an increased risk of diabetes. . Currently, no consensus exists regarding use of hemoglobin A1c for diagnosis of diabetes for children. .   11/30/2022 02:52 PM 11.8 (H) <5.7 % of total Hgb Final    Comment:    For someone without known diabetes, a hemoglobin A1c value of 6.5% or greater indicates that they may have  diabetes and this should be confirmed with a follow-up  test. . For someone with known diabetes, a value <7% indicates  that their diabetes is well controlled and a value  greater than or equal to 7% indicates suboptimal  control. A1c targets should be individualized based on  duration of diabetes, age, comorbid conditions, and  other considerations. . Currently, no consensus exists regarding use of hemoglobin A1c for diagnosis of diabetes for children. .     CBG: No results for input(s): "GLUCAP" in the last 168 hours.  Review of Systems:   Positives in BOLD: Gen: Denies fever, chills, weight change, fatigue, night sweats HEENT: Denies blurred vision, double vision, hearing loss, tinnitus, sinus congestion, rhinorrhea, sore throat, neck stiffness, dysphagia PULM: Denies shortness of breath, cough, sputum production, hemoptysis, wheezing CV: Denies chest pain, edema, orthopnea, paroxysmal nocturnal dyspnea, palpitations GI: Denies abdominal pain, nausea, vomiting, diarrhea, hematochezia, melena, constipation, change in bowel habits GU: Denies dysuria, hematuria, polyuria, oliguria, urethral discharge Endocrine: Denies hot or cold intolerance, polyuria, polyphagia or appetite change Derm: Denies rash, dry skin, scaling or peeling skin change Heme: Denies easy bruising, bleeding, bleeding gums Neuro:  Denies headache, numbness, weakness, slurred speech, loss of memory or consciousness   Past Medical History:  She,  has a past medical history of Acute respiratory failure with hypoxia (HCC) (06/24/2021), Arthralgia of temporomandibular joint, CAD, multiple vessel, Carotid arterial disease (HCC), Cerebrovascular disease, Clotting disorder (HCC), Complex sleep apnea syndrome (10/03/2022), Depression, Diastolic dysfunction, Essential hypertension, Essential hypertension, malignant (06/28/2016), Fatty liver disease, nonalcoholic (2016), History of blood transfusion, History of MI (myocardial infarction) (July 2017) (08/02/2020), HLD (hyperlipidemia), HTN (hypertension), malignant (10/20/2013), Labile hypertension, Mild tobacco abuse in early remission (06/28/2016), Myocardial infarction (HCC) (2017), Obesity, PAD (peripheral artery disease) (HCC), PTSD (post-traumatic stress disorder), Reflux gastritis (07/01/2023), Stroke (HCC), Tobacco abuse, and Type 2 diabetes mellitus (HCC) (12/2015).   Surgical History:   Past Surgical History:  Procedure Laterality Date   ABDOMINAL AORTOGRAM W/LOWER EXTREMITY [name] 10/15/2018   Procedure: ABDOMINAL AORTOGRAM W/LOWER EXTREMITY;  Surgeon: Iran Ouch, MD;  Location: [facility];  Service: Cardiovascular;  Laterality: [name];   ABDOMINAL AORTOGRAM W/LOWER EXTREMITY Bilateral 08/19/2019   Procedure: ABDOMINAL AORTOGRAM W/LOWER EXTREMITY;  Surgeon: Iran Ouch, MD;  Location: [facility];  Service: Cardiovascular;  Laterality: Bilateral;   CARDIAC CATHETERIZATION [name] 06/29/2016   Procedure: Left Heart Cath and Coronary Angiography;  Surgeon: Antonieta Iba, MD;  Location: [facility] INVASIVE CV LAB;  Service: Cardiovascular;  Laterality: [name];   CARDIAC CATHETERIZATION [name] 08/29/2016   Procedure: Left Heart Cath and Cors/Grafts Angiography;  Surgeon: Iran Ouch, MD;  Location: [facility];  Service: Cardiovascular;  Laterality: [name];    CESAREAN SECTION     CHOLECYSTECTOMY     CORONARY ARTERY BYPASS GRAFT [name] 07/06/2016   Procedure: CORONARY ARTERY BYPASS GRAFTING (CABG) x four, using left internal mammary artery and right leg greater saphenous vein harvested endoscopically;  Surgeon: Kerin Perna, MD;  Location: [facility] OR;  Service: Open Heart Surgery;  Laterality: [name];   ENDARTERECTOMY Right 07/06/2016   Procedure: ENDARTERECTOMY CAROTID;  Surgeon: Larina Earthly, MD;  Location: [facility] OR;  Service: Vascular;  Laterality: Right;   ENDARTERECTOMY Right 04/27/2020   Procedure: REDO OF RIGHT ENDARTERECTOMY CAROTID;  Surgeon: Larina Earthly, MD;  Location: [facility] OR;  Service: Vascular;  Laterality: Right;   LEFT HEART CATH AND CORS/GRAFTS ANGIOGRAPHY [name] 08/24/2020   Procedure: LEFT HEART CATH AND CORS/GRAFTS ANGIOGRAPHY;  Surgeon: Iran Ouch, MD;  Location: [facility];  Service: Cardiovascular;  Laterality: [name];   LEFT HEART CATH AND CORS/GRAFTS ANGIOGRAPHY [name] 01/29/2022   Procedure:  LEFT HEART CATH AND CORS/GRAFTS ANGIOGRAPHY;  Surgeon: Iran Ouch, MD;  Location: [facility] INVASIVE CV LAB;  Service: Cardiovascular;  Laterality: [name];   PERIPHERAL VASCULAR CATHETERIZATION [name] 04/18/2016   Procedure: Renal Angiography;  Surgeon: Iran Ouch, MD;  Location: [facility];  Service: Cardiovascular;  Laterality: [name];   PERIPHERAL VASCULAR INTERVENTION Left 10/15/2018   Procedure: PERIPHERAL VASCULAR INTERVENTION;  Surgeon: Iran Ouch, MD;  Location: [facility];  Service: Cardiovascular;  Laterality: Left;  Left superficial femoral   TEE WITHOUT CARDIOVERSION [name] 07/06/2016   Procedure: TRANSESOPHAGEAL ECHOCARDIOGRAM (TEE);  Surgeon: Kerin Perna, MD;  Location: [facility] OR;  Service: Open Heart Surgery;  Laterality: [name];   TONSILLECTOMY       Social History:   reports that she quit smoking about 2 years ago. Her smoking use included cigarettes. She started smoking about 32 years ago. She has a 15 pack-year smoking  history. She has never been exposed to tobacco smoke. She has never used smokeless tobacco. She reports that she does not currently use alcohol. She reports that she does not use drugs.   Family History:  Her family history includes Alcohol abuse in her father; Anxiety disorder in her sister; Diabetes in her father and mother; Drug abuse in her father; Heart disease in her father; Stroke in her sister. She was adopted.   Allergies No Known Allergies   Home Medications  Prior to Admission medications   Medication Sig Start Date End Date Taking? Authorizing Provider  acetaminophen (TYLENOL) 325 MG tablet Take 1-2 tablets (325-650 mg total) by mouth every 4 (four) hours as needed for mild pain. 08/01/21   Love, Evlyn Kanner, PA-C  albuterol (PROVENTIL) (2.5 MG/3ML) 0.083% nebulizer solution Take 3 mLs (2.5 mg total) by nebulization every 4 (four) hours as needed for wheezing or shortness of breath. 07/05/23 08/04/23  Enedina Finner, MD  amitriptyline (ELAVIL) 10 MG tablet Take 1 tablet (10 mg total) by mouth at bedtime. 07/23/23   Raulkar, Drema Pry, MD  chlorproMAZINE (THORAZINE) 25 MG tablet Take 3 tablets (75 mg total) by mouth at bedtime. 07/08/23   Arfeen, Phillips Grout, MD  clonazePAM (KLONOPIN) 0.5 MG tablet Take 0.5 tablets (0.25 mg total) by mouth 2 (two) times daily. Take 1/2 tab twice daily. No dissolving tablets. 07/08/23   Arfeen, Phillips Grout, MD  clopidogrel (PLAVIX) 75 MG tablet Take 1 tablet (75 mg total) by mouth daily. 03/06/23   Sater, Pearletha Furl, MD  Continuous Glucose Sensor (DEXCOM G7 SENSOR) MISC APPLY ONE SENSOR TO THE BACK OF YOUR UPPER ARM. REPLACE EVERY 10 DAYS. 04/12/23   Eden Emms, NP  cyclobenzaprine (FLEXERIL) 10 MG tablet TAKE ONE TABLET BY MOUTH AT BEDTIME 06/26/23   Raulkar, Drema Pry, MD  escitalopram (LEXAPRO) 20 MG tablet Take 1 tablet (20 mg total) by mouth daily. 07/08/23 10/06/23  Arfeen, Phillips Grout, MD  Evolocumab (REPATHA SURECLICK) 140 MG/ML SOAJ Inject 140 mg into the skin as directed.  Inject 1 Dose into the skin every 14 (fourteen) days. 10/31/22   Furth, Cadence H, PA-C  ferrous sulfate 324 (65 Fe) MG TBEC Take 1 tablet (324 mg total) by mouth 2 (two) times daily. 07/16/23   Iran Ouch, MD  fludrocortisone (FLORINEF) 0.1 MG tablet Take 1 tablet (0.1 mg total) by mouth daily. 07/16/23   Iran Ouch, MD  icosapent Ethyl (VASCEPA) 1 g capsule Take 2 g by mouth 2 (two) times daily.    [provider]  lamoTRIgine (LAMICTAL) 200 MG tablet Take 1 tablet (200 mg total) by mouth at bedtime. 07/08/23   Arfeen, Phillips Grout, MD  lamoTRIgine (LAMICTAL) 25 MG tablet Take 1 tablet (25 mg total) by mouth daily. Patient taking differently: Take 25 mg by mouth at bedtime. 07/08/23 10/06/23  Arfeen, Phillips Grout, MD  metoprolol succinate (TOPROL-XL) 25 MG 24 hr tablet Take 1 tablet (25 mg total) by mouth daily. 07/16/23   Iran Ouch, MD  midodrine (PROAMATINE) 5 MG tablet Take 1 tablet (5 mg total) by mouth 3 (three) times daily with meals. Patient not taking: Reported on 07/29/2023 07/05/23   Enedina Finner, MD  [facility] 7.5 MG/0.5ML Pen INJECT THE CONTENTS OF ONE PEN UNDER THE SKIN WEEKLY ON THE SAME DAY EACH WEEK 06/21/23   Eden Emms, NP  nitroGLYCERIN (NITROSTAT) 0.4 MG SL tablet Place 1 tablet (0.4 mg total) under the tongue every 5 (five) minutes as needed for chest pain. 03/15/21   Iran Ouch, MD  oxyCODONE (ROXICODONE) 15 MG immediate release tablet Take 1 tablet (15 mg total) by mouth 2 (two) times daily as needed for pain. 07/23/23   Raulkar, Drema Pry, MD  pantoprazole (PROTONIX) 40 MG tablet TAKE ONE TABLET BY MOUTH ONE TIME DAILY 07/24/23   Raulkar, Drema Pry, MD  pregabalin (LYRICA) 225 MG capsule Take 1 capsule (225 mg total) by mouth 2 (two) times daily. 04/11/23   Jones Bales, NP  Tiotropium Bromide-Olodaterol (STIOLTO RESPIMAT) 2.5-2.5 MCG/ACT AERS Inhale 2 puffs into the lungs daily. 06/24/23   Glenford Bayley, NP  topiramate (TOPAMAX) 25 MG tablet Take 1  tablet (25 mg total) by mouth at bedtime. 05/16/23   Raulkar, Drema Pry, MD  ULTICARE MINI PEN NEEDLES 31G X 6 MM MISC USE PEN NEEDLES TWO TIMES DAILY 12/26/22   Eden Emms, NP        Critical Care Time devoted to patient care services described in this note is 65 minutes.  Critical care was necessary to treat /prevent imminent and life-threatening deterioration.   Lucie Leather, M.D.  [facility]  Medical Director [facility] Medical Director [facility] Cardio-Pulmonary Department

## 2023-07-30 NOTE — H&P (Signed)
NAME:  Erika [name], MRN:  [id number], DOB:  02-Feb-1971, LOS: 0 ADMISSION DATE:  07/30/2023,  CHIEF COMPLAINT:  SOB, AMS   Brief Pt Description / Synopsis:  Erika [name] is 52 yo female who has a PMHx of chronic respiratory failure on 4L home O2 via [place], HTN, HLD, CABG & Endarterectomy s/p MI (2017), CVA w/ left-sided deficit, obesity, OSA, PAD, PNA, PTSD, Reflux gastritis (06/2023), T2DM who presents to [facility] ED for SOB and AMS while being treated for bilat PNA with Augmentin and Prednisone taper following discharge from [facility] on 07/26.   History of Present Illness:  Erika [name] is 52 yo female who has a PMHx of chronic respiratory failure on 4L home O2 via [place], HTN, HLD, CABG & Endarterectomy s/p MI (2017), CVA w/ left-sided deficit, obesity, OSA, PAD, PNA, PTSD, Reflux gastritis (06/2023), T2DM who presents to [facility] ED for SOB and AMS while being treated for bilat PNA with Augmentin and Prednisone taper following discharge from [facility] on 07/26. In the ED, patient was started on vancomycin and cefepime for empiric HAP coverage. Patient began getting hypotensive and, despite 1L of IVF, patient was started on Levophed.   On interview, patient was A&Ox4 in bed on 6L O2 via [place] with her husband, a former paramedic, in the room. Per patient's husband, patient was found at home by him with her BiPAP off and her oxygen level was about 30%. Patient's husband increased her machine to 10L and the "highest I could get it [patient's pulse ox] is was about 67%". Patient's husband also noted her mental status was poor and did not improve with oxygen at this time. Patient is currently full code, but she was previously DNR/DNI changed due to organ donor status for family member. At home, patient is normally on 4L home O2 via [place] and [place] at night. Patient normally walks with a hemi walker due to left-sided deficits s/p CVA.   ED Course: Initial Vital Signs:       Today's Vitals    07/30/23 1245  07/30/23 1255 07/30/23 1304 07/30/23 1340  BP: (!) 89/54 (!) 109/51      Pulse: 62 78      Resp: 19 12      Temp:     99.9 F (37.7 C)    TempSrc:     Oral    SpO2: 92% 91%      Weight:       88.3 kg  Height:       5\' 7"  (1.702 m)  PainSc:       7     Body mass index is 30.49 kg/m.   Significant Labs: CBC: WBC 21.1, Hgb 9.1, Plt 374 BNP: 90.1 Trop: 136 >> 450 LA: 1.0 VBG: pH 7.38, pCO2 56, Bicarb 33.1, Acid-Base excess 6.3   Imaging: CXR Impression: Prominent bilateral interstitial opacities, which are somewhat basal predominant. Findings could represent pulmonary edema or multifocal infection.   CT Head wo Contrast Impression: 1. No acute intracranial abnormality. 2. Chronic cortical encephalomalacia in the right parietal and occipital lobes, and moderate brainstem atrophy and/or Wallerian degeneration - greater on the right.   CT Angio Chest PE Impression: 1. Negative for acute pulmonary embolus. 2. Multi lobar and dependent bilateral pneumonia, progressed since 07/16/2023 CTA. Consider sequelae of aspiration in this clinical setting. 3.  Aortic Atherosclerosis (ICD10-I70.0).  Prior CABG.   Medications Administered:  -LR 1L -Levophed -Vancomycin -Cefepime -Duo-Neb   Pertinent  Medical History  -Chronic  respiratory failure -HTN -HLD -CABG & Endarterectomy s/p MI (2017) -CVA w/ left-sided deficit -Obesity -OSA -PAD -PNA -PTSD -Reflux gastritis (06/2023) -T2DM    Micro Data:  Coronavirus >> Neg BC x2 >> PEND MRSA >> PEND  Antibiotics Given (last 72 hours)     Date/Time Action Medication Dose Rate   07/30/23 1021 New Bag/Given   ceFEPIme (MAXIPIME) 2 g in sodium chloride 0.9 % 100 mL IVPB 2 g 200 mL/hr   07/30/23 1053 New Bag/Given   vancomycin (VANCOCIN) IVPB 1000 mg/200 mL premix 1,000 mg 200 mL/hr   07/30/23 1217 New Bag/Given   vancomycin (VANCOREADY) IVPB 750 mg/150 mL 750 mg 150 mL/hr         Significant Hospital Events: Including procedures,  antibiotic start and stop dates in addition to other pertinent events   Pt admitted to ICU, initiated new abx, started levophed   Interim History / Subjective:  -Patient admitted to ICU -Currently on 6L O2 via [place] -New abx (Vancomycin and Cefepime) ordered for empiric HAP coverage -Levophed started for hypotension    BP (!) 122/47   Pulse 78   Temp 99.9 F (37.7 C) (Oral)   Resp (!) 21   Ht 5\' 7"  (1.702 m)   Wt 88.3 kg   SpO2 90%   BMI 30.49 kg/m    Intake/Output Summary (Last 24 hours) at 07/30/2023 1632 Last data filed at 07/30/2023 1535 Gross per 24 hour  Intake 2048.18 ml  Output 0 ml  Net 2048.18 ml     Examination: General: Ill-appearing, comfortable on 6L via [place] HENT: PERRL Lungs: +Rhonchi,  Cardiovascular: S1, S2, RR Abdomen: soft, nontender Extremities: Left-sided paralysis (s/p previous CVA) Neuro: A&Ox4 GU: External catheter in place     Review of Systems:   Positives in BOLD: Gen: Denies fever, chills, weight change, fatigue, night sweats HEENT: Denies blurred vision, double vision, hearing loss, tinnitus, sinus congestion, rhinorrhea, sore throat, neck stiffness, dysphagia PULM: Denies shortness of breath, cough, sputum production, hemoptysis, wheezing CV: Denies chest pain, edema, orthopnea, paroxysmal nocturnal dyspnea, palpitations GI: Denies abdominal pain, nausea, vomiting, diarrhea, hematochezia, melena, constipation, change in bowel habits GU: Denies dysuria, hematuria, polyuria, oliguria, urethral discharge Endocrine: Denies hot or cold intolerance, polyuria, polyphagia or appetite change Derm: Denies rash, dry skin, scaling or peeling skin change Heme: Denies easy bruising, bleeding, bleeding gums Neuro: Denies headache, numbness, weakness, slurred speech, loss of memory or consciousness     Past Medical History:  She,  has a past medical history of Acute respiratory failure with hypoxia (HCC) (06/24/2021), Arthralgia of temporomandibular  joint, CAD, multiple vessel, Carotid arterial disease (HCC), Cerebrovascular disease, Clotting disorder (HCC), Complex sleep apnea syndrome (10/03/2022), Depression, Diastolic dysfunction, Essential hypertension, Essential hypertension, malignant (06/28/2016), Fatty liver disease, nonalcoholic (2016), History of blood transfusion, History of MI (myocardial infarction) (July 2017) (08/02/2020), HLD (hyperlipidemia), HTN (hypertension), malignant (10/20/2013), Labile hypertension, Mild tobacco abuse in early remission (06/28/2016), Myocardial infarction (HCC) (2017), Obesity, PAD (peripheral artery disease) (HCC), PTSD (post-traumatic stress disorder), Reflux gastritis (07/01/2023), Stroke (HCC), Tobacco abuse, and Type 2 diabetes mellitus (HCC) (12/2015).    Surgical History:         Past Surgical History:  Procedure Laterality Date   ABDOMINAL AORTOGRAM W/LOWER EXTREMITY [name] 10/15/2018    Procedure: ABDOMINAL AORTOGRAM W/LOWER EXTREMITY;  Surgeon: Iran Ouch, MD;  Location: [facility] INVASIVE CV LAB;  Service: Cardiovascular;  Laterality: [name];   ABDOMINAL AORTOGRAM W/LOWER EXTREMITY Bilateral 08/19/2019    Procedure: ABDOMINAL AORTOGRAM W/LOWER EXTREMITY;  Surgeon: Iran Ouch, MD;  Location: [facility] INVASIVE CV LAB;  Service: Cardiovascular;  Laterality: Bilateral;   CARDIAC CATHETERIZATION [name] 06/29/2016    Procedure: Left Heart Cath and Coronary Angiography;  Surgeon: Antonieta Iba, MD;  Location: [facility] INVASIVE CV LAB;  Service: Cardiovascular;  Laterality: [name];   CARDIAC CATHETERIZATION [name] 08/29/2016    Procedure: Left Heart Cath and Cors/Grafts Angiography;  Surgeon: Iran Ouch, MD;  Location: [facility] INVASIVE CV LAB;  Service: Cardiovascular;  Laterality: [name];   CESAREAN SECTION       CHOLECYSTECTOMY       CORONARY ARTERY BYPASS GRAFT [name] 07/06/2016    Procedure: CORONARY ARTERY BYPASS GRAFTING (CABG) x four, using left internal mammary artery and right leg greater saphenous vein harvested  endoscopically;  Surgeon: Kerin Perna, MD;  Location[facility];  Service: Open Heart Surgery;  Laterality: [name];   ENDARTERECTOMY Right 07/06/2016    Procedure: ENDARTERECTOMY CAROTID;  Surgeon: Larina Earthly, MD;  Location: [facility] OR;  Service: Vascular;  Laterality: Right;   ENDARTERECTOMY Right 04/27/2020    Procedure: REDO OF RIGHT ENDARTERECTOMY CAROTID;  Surgeon: Larina Earthly, MD;  Location: [facility] OR;  Service: Vascular;  Laterality: Right;   LEFT HEART CATH AND CORS/GRAFTS ANGIOGRAPHY [name] 08/24/2020    Procedure: LEFT HEART CATH AND CORS/GRAFTS ANGIOGRAPHY;  Surgeon: Iran Ouch, MD;  Location: [facility] INVASIVE CV LAB;  Service: Cardiovascular;  Laterality: [name];   LEFT HEART CATH AND CORS/GRAFTS ANGIOGRAPHY [name] 01/29/2022    Procedure: LEFT HEART CATH AND CORS/GRAFTS ANGIOGRAPHY;  Surgeon: Iran Ouch, MD;  Location: [facility] INVASIVE CV LAB;  Service: Cardiovascular;  Laterality: [name];   PERIPHERAL VASCULAR CATHETERIZATION [name] 04/18/2016    Procedure: Renal Angiography;  Surgeon: Iran Ouch, MD;  Location: [facility] INVASIVE CV LAB;  Service: Cardiovascular;  Laterality: [name];   PERIPHERAL VASCULAR INTERVENTION Left 10/15/2018    Procedure: PERIPHERAL VASCULAR INTERVENTION;  Surgeon: Iran Ouch, MD;  Location: [facility] INVASIVE CV LAB;  Service: Cardiovascular;  Laterality: Left;  Left superficial femoral   TEE WITHOUT CARDIOVERSION [name] 07/06/2016    Procedure: TRANSESOPHAGEAL ECHOCARDIOGRAM (TEE);  Surgeon: Kerin Perna, MD;  Location: [facility] OR;  Service: Open Heart Surgery;  Laterality: [name];   TONSILLECTOMY              Social History:   reports that she quit smoking about 2 years ago. Her smoking use included cigarettes. She started smoking about 32 years ago. She has a 15 pack-year smoking history. She has never been exposed to tobacco smoke. She has never used smokeless tobacco. She reports that she does not currently use alcohol. She reports that she does not use drugs.    Family History:  Her  family history includes Alcohol abuse in her father; Anxiety disorder in her sister; Diabetes in her father and mother; Drug abuse in her father; Heart disease in her father; Stroke in her sister. She was adopted.     CBC    Component Value Date/Time   WBC 21.1 (H) 07/30/2023 0838   RBC 4.19 07/30/2023 0838   HGB 9.1 (L) 07/30/2023 0838   HGB 14.2 09/11/2021 1413   HCT 31.2 (L) 07/30/2023 0838   HCT 43.5 09/11/2021 1413   PLT 374 07/30/2023 0838   PLT 433 09/11/2021 1413   MCV 74.5 (L) 07/30/2023 0838   MCV 89 09/11/2021 1413   MCV 89 01/11/2015 1022   MCH 21.7 (L) 07/30/2023 0838   MCHC 29.2 (L) 07/30/2023  1610   RDW 23.1 (H) 07/30/2023 0838   RDW 12.8 09/11/2021 1413   RDW 13.8 01/11/2015 1022   LYMPHSABS 1.7 07/16/2023 1836   LYMPHSABS 2.6 09/11/2021 1413   LYMPHSABS 3.4 01/11/2014 1312   MONOABS 0.6 07/16/2023 1836   MONOABS 0.7 01/11/2014 1312   EOSABS 0.0 07/16/2023 1836   EOSABS 0.2 09/11/2021 1413   EOSABS 0.3 01/11/2014 1312   BASOSABS 0.1 07/16/2023 1836   BASOSABS 0.0 09/11/2021 1413   BASOSABS 0.1 01/11/2014 1312        Latest Ref Rng & Units 07/30/2023    9:12 AM 07/30/2023    8:38 AM 07/26/2023    4:28 PM  BMP  Glucose 70 - 99 mg/dL 960  454  098   BUN 6 - 20 mg/dL 12  12  10    Creatinine 0.44 - 1.00 mg/dL 1.19  1.47  8.29   BUN/Creat Ratio 6 - 22 (calc)   SEE NOTE:   Sodium 135 - 145 mmol/L 136  135  139   Potassium 3.5 - 5.1 mmol/L 4.4  4.1  4.0   Chloride 98 - 111 mmol/L 96  97  100   CO2 22 - 32 mmol/L 31  30  32   Calcium 8.9 - 10.3 mg/dL 9.4  9.2  9.5    Current Facility-Administered Medications on File Prior to Encounter  Medication Dose Route Frequency Provider Last Rate Last Admin   albuterol (PROVENTIL) (2.5 MG/3ML) 0.083% nebulizer solution 2.5 mg  2.5 mg Nebulization Once Glenford Bayley, NP       Current Outpatient Medications on File Prior to Encounter  Medication Sig Dispense Refill   acetaminophen (TYLENOL) 325 MG tablet Take 1-2  tablets (325-650 mg total) by mouth every 4 (four) hours as needed for mild pain.     albuterol (PROVENTIL) (2.5 MG/3ML) 0.083% nebulizer solution Take 3 mLs (2.5 mg total) by nebulization every 4 (four) hours as needed for wheezing or shortness of breath. 75 mL 2   amitriptyline (ELAVIL) 10 MG tablet Take 1 tablet (10 mg total) by mouth at bedtime. 30 tablet 1   chlorproMAZINE (THORAZINE) 25 MG tablet Take 3 tablets (75 mg total) by mouth at bedtime. 90 tablet 2   clonazePAM (KLONOPIN) 0.5 MG tablet Take 0.5 tablets (0.25 mg total) by mouth 2 (two) times daily. Take 1/2 tab twice daily. No dissolving tablets. 30 tablet 2   clopidogrel (PLAVIX) 75 MG tablet Take 1 tablet (75 mg total) by mouth daily. 90 tablet 3   cyclobenzaprine (FLEXERIL) 10 MG tablet TAKE ONE TABLET BY MOUTH AT BEDTIME 30 tablet 3   escitalopram (LEXAPRO) 20 MG tablet Take 1 tablet (20 mg total) by mouth daily. 30 tablet 2   ferrous sulfate 324 (65 Fe) MG TBEC Take 1 tablet (324 mg total) by mouth 2 (two) times daily. 60 tablet 2   fludrocortisone (FLORINEF) 0.1 MG tablet Take 1 tablet (0.1 mg total) by mouth daily. 90 tablet 1   icosapent Ethyl (VASCEPA) 1 g capsule Take 2 g by mouth 2 (two) times daily.     lamoTRIgine (LAMICTAL) 200 MG tablet Take 1 tablet (200 mg total) by mouth at bedtime. 30 tablet 2   lamoTRIgine (LAMICTAL) 25 MG tablet Take 1 tablet (25 mg total) by mouth daily. (Patient taking differently: Take 25 mg by mouth at bedtime.) 30 tablet 2   metoprolol succinate (TOPROL-XL) 25 MG 24 hr tablet Take 1 tablet (25 mg total) by mouth daily. 90  tablet 1   nitroGLYCERIN (NITROSTAT) 0.4 MG SL tablet Place 1 tablet (0.4 mg total) under the tongue every 5 (five) minutes as needed for chest pain. 25 tablet 1   oxyCODONE (ROXICODONE) 15 MG immediate release tablet Take 1 tablet (15 mg total) by mouth 2 (two) times daily as needed for pain. 60 tablet 0   pantoprazole (PROTONIX) 40 MG tablet TAKE ONE TABLET BY MOUTH ONE TIME  DAILY 30 tablet 0   pregabalin (LYRICA) 225 MG capsule Take 1 capsule (225 mg total) by mouth 2 (two) times daily. 60 capsule 3   Tiotropium Bromide-Olodaterol (STIOLTO RESPIMAT) 2.5-2.5 MCG/ACT AERS Inhale 2 puffs into the lungs daily. 4 g 0   topiramate (TOPAMAX) 25 MG tablet Take 1 tablet (25 mg total) by mouth at bedtime. 90 tablet 3   Continuous Glucose Sensor (DEXCOM G7 SENSOR) MISC APPLY ONE SENSOR TO THE BACK OF YOUR UPPER ARM. REPLACE EVERY 10 DAYS. 3 each 5   Evolocumab (REPATHA SURECLICK) 140 MG/ML SOAJ Inject 140 mg into the skin as directed. Inject 1 Dose into the skin every 14 (fourteen) days. 6 mL 3   midodrine (PROAMATINE) 5 MG tablet Take 1 tablet (5 mg total) by mouth 3 (three) times daily with meals. (Patient not taking: Reported on 07/29/2023) 90 tablet 1   MOUNJARO 7.5 MG/0.5ML Pen INJECT THE CONTENTS OF ONE PEN UNDER THE SKIN WEEKLY ON THE SAME DAY EACH WEEK 6 mL 0   ULTICARE MINI PEN NEEDLES 31G X 6 MM MISC USE PEN NEEDLES TWO TIMES DAILY 100 each 3          Assessment & Plan:  Dwanna Maignan is a 52yo female with an extensive cardiac history, chronic respiratory failure, CVA w/ left-sided deficit, obesity, OSA, PTSD, Reflux gastritis (06/2023), and T2DM who presented to [facility] ED for evaluation of SOB & AMS while being treat for bilateral PNA and 3 previous hospitalizations in last ~7 weeks requested admission to PCCM for acute respiratory failure and hypotension in setting of likely HAP.   #Acute on chronic Respiratory Failure CXR: Prominent bilateral interstitial opacities, which are somewhat basal predominant. Findings could represent pulmonary edema or multifocal infection. CT Angio Chest PE: No acute PE. Multi lobar and dependent bilateral pneumonia, progressed since 07/16/2023 CTA. Consider sequelae of aspiration in this clinical setting. Aortic Atherosclerosis & Prior CABG. -Supplemental O2 via [place] as needed to maintain O2 sats 90% -Follow intermittent Chest X-ray  & ABG as needed -Bronchodilators & Pulmicort nebs -IV Steroids -Pulmonary toilet as able -Continue antibiotics as recommended -Coronavirus >> Neg -MRSA >> Pend -Increase level of respiratory management if patient's status decompensates   #Septic Shock, Source: Worsening PNA vs ?HAP #HFpEF Echo (07/17/23): LVEF 60-65%. Mild LV hypertrophy. LV diastolic parameters consistent w/ Grade I diastolic dysfunction. CT Head wo Contrast: No acute intracranial abn. Chronic cortical encephalomalacia in right parietal and occipital lobes, and moderate brainstem atrophy and/or Wallerian degeneration - greater on the right -Monitor fever curve -Continuous cardiac monitoring -Maintain MAP >55 -IV fluids -Vasopressors as needed to maintain MAP goal -Trend lactic acid until normalized -Trend HS Troponin until peaked -Trend WBC's & Procalcitonin -Diuresis as BP and renal function permits -Follow cultures as above -Continue empiric Vancomycin & Cefepime, pending cultures & sensitivities  -BC x2 >> Pend   #Chronic cortical encephalomalacia likely s/p previous CVA #Acute Metabolic Encephalopathy -Continue monitor mental status -Patient's left-sided deficits chronic -Treatment of metabolic derangements as outlined above -Provide supportive care -Promote normal sleep/wake cycle and family  presence -Avoid sedating medications as able   Demand Ischemia Family requesting Cardiology evaluation Continue plavix   #T2DM -CBG's q4h; Target range of 140 to 180 -SSI -Follow ICU Hypo/Hyperglycemia protocol   Renal -Monitor I&O's / urinary output -Follow BMP -Ensure adequate renal perfusion -Avoid nephrotoxic agents as able -Replace electrolytes as indicated ~ Pharmacy following for assistance with electrolyte replacement   Heme -Monitor for S/Sx of bleeding -Trend CBC -Transfuse for Hgb <7 -DVT prophylaxis: SCDs , HEP sq     Best Practice (right click and "Reselect all SmartList Selections"  daily)    Diet/type: NPO DVT prophylaxis: SCD, subcutaneous heparin GI prophylaxis: PPI Lines: [name] Foley:  [name] Code Status:  full code     Critical Care Time devoted to patient care services described in this note is 55 minutes.  Critical care was necessary to treat /prevent imminent and life-threatening deterioration.   Lucie Leather, M.D.  [facility]  Medical Director [facility] Medical Director [facility] Cardio-Pulmonary Department

## 2023-07-30 NOTE — Progress Notes (Signed)
PCCM transfer accepted.  [facility] will assume attending role on 07/31/2023 at 7 AM

## 2023-07-30 NOTE — Evaluation (Signed)
Clinical/Bedside Swallow Evaluation Patient Details  Name: Erika [name] MRN: [id number] Date of Birth: June 20, 1971  Today's Date: 07/30/2023 Time: SLP Start Time (ACUTE ONLY): 1430 SLP Stop Time (ACUTE ONLY): 1530 SLP Time Calculation (min) (ACUTE ONLY): 60 min  Past Medical History:  Past Medical History:  Diagnosis Date   Acute respiratory failure with hypoxia (HCC) 06/24/2021   Arthralgia of temporomandibular joint    CAD, multiple vessel    a. 06/2016 Cath: ostLM 40%, ostLAD 40%, pLAD 95%, ost-pLCx 60%, pLCx 95%, mLCx 60%, mRCA 95%, D2 50%, LVSF nl;  b. 07/2016 CABG x 4 (LIMA->LAD, VG->Diag, VG->OM, VG->RCA); c. 08/2016 Cath: 3VD w/ 4/4 patent grafts. LAD distal to LIMA has diff dzs->Med rx; d. 08/2020 Cath: 4/4 patent grafts, native 3VD. EF 55-65%-->Med Rx.   Carotid arterial disease (HCC)    a. 07/2016 s/p R CEA; b. 02/2021 U/S: RICA 40-59%, LICA 1-39%.   Cerebrovascular disease    Clotting disorder (HCC)    Complex sleep apnea syndrome 10/03/2022   Depression    Diastolic dysfunction    a. 06/2016 Echo: EF 50-55%, mild inf wall HK, GR1DD, mild MR, RV sys fxn nl, mildly dilated LA, PASP nl; b. 06/2021 Echo: EF 60-65%, no rwma. Nl RV fxn.   Essential hypertension    Essential hypertension, malignant 06/28/2016   Fatty liver disease, nonalcoholic 2016   History of blood transfusion    with heart surgery   History of MI (myocardial infarction) (July 2017) 08/02/2020   HLD (hyperlipidemia)    HTN (hypertension), malignant 10/20/2013   Labile hypertension    a. prior renal ngiogram negative for RAS in 03/2016; b. catecholamines and metanephrines normal, mildly elevated renin with normal aldosterone and normal ratio in 02/2016   Mild tobacco abuse in early remission 06/28/2016   Myocardial infarction [facility]) 2017   Obesity    PAD (peripheral artery disease) (HCC)    a. 09/2018 s/p L SFA stenting; b. 07/2019 Periph Angio: Patent m/d L SFA stent w/ 100% L SFA distal to stent. L AT 100d, L  Peroneal diff dzs-->Med Rx; c. 02/2021 ABIs: stable @ 0.61 on R and 0.46 on L.   PTSD (post-traumatic stress disorder)    Reflux gastritis 07/01/2023   Stroke (HCC)    Tobacco abuse    Type 2 diabetes mellitus (HCC) 12/2015   Past Surgical History:  Past Surgical History:  Procedure Laterality Date   ABDOMINAL AORTOGRAM W/LOWER EXTREMITY [name] 10/15/2018   Procedure: ABDOMINAL AORTOGRAM W/LOWER EXTREMITY;  Surgeon: Iran Ouch, MD;  Location: [facility];  Service: Cardiovascular;  Laterality: [name];   ABDOMINAL AORTOGRAM W/LOWER EXTREMITY Bilateral 08/19/2019   Procedure: ABDOMINAL AORTOGRAM W/LOWER EXTREMITY;  Surgeon: Iran Ouch, MD;  Location: [facility];  Service: Cardiovascular;  Laterality: Bilateral;   CARDIAC CATHETERIZATION [name] 06/29/2016   Procedure: Left Heart Cath and Coronary Angiography;  Surgeon: Antonieta Iba, MD;  Location: [facility];  Service: Cardiovascular;  Laterality: [name];   CARDIAC CATHETERIZATION [name] 08/29/2016   Procedure: Left Heart Cath and Cors/Grafts Angiography;  Surgeon: Iran Ouch, MD;  Location: [facility];  Service: Cardiovascular;  Laterality: [name];   CESAREAN SECTION     CHOLECYSTECTOMY     CORONARY ARTERY BYPASS GRAFT [name] 07/06/2016   Procedure: CORONARY ARTERY BYPASS GRAFTING (CABG) x four, using left internal mammary artery and right leg greater saphenous vein harvested endoscopically;  Surgeon: Kerin Perna, MD;  Location: [facility] OR;  Service: Open  Heart Surgery;  Laterality: [name];   ENDARTERECTOMY Right 07/06/2016   Procedure: ENDARTERECTOMY CAROTID;  Surgeon: Larina Earthly, MD;  Location: [facility];  Service: Vascular;  Laterality: Right;   ENDARTERECTOMY Right 04/27/2020   Procedure: REDO OF RIGHT ENDARTERECTOMY CAROTID;  Surgeon: Larina Earthly, MD;  Location: [facility] OR;  Service: Vascular;  Laterality: Right;   LEFT HEART CATH AND CORS/GRAFTS ANGIOGRAPHY [name] 08/24/2020   Procedure: LEFT HEART CATH AND CORS/GRAFTS  ANGIOGRAPHY;  Surgeon: Iran Ouch, MD;  Location: [facility];  Service: Cardiovascular;  Laterality: [name];   LEFT HEART CATH AND CORS/GRAFTS ANGIOGRAPHY [name] 01/29/2022   Procedure: LEFT HEART CATH AND CORS/GRAFTS ANGIOGRAPHY;  Surgeon: Iran Ouch, MD;  Location: [facility];  Service: Cardiovascular;  Laterality: [name];   PERIPHERAL VASCULAR CATHETERIZATION [name] 04/18/2016   Procedure: Renal Angiography;  Surgeon: Iran Ouch, MD;  Location: [facility];  Service: Cardiovascular;  Laterality: [name];   PERIPHERAL VASCULAR INTERVENTION Left 10/15/2018   Procedure: PERIPHERAL VASCULAR INTERVENTION;  Surgeon: Iran Ouch, MD;  Location: [facility];  Service: Cardiovascular;  Laterality: Left;  Left superficial femoral   TEE WITHOUT CARDIOVERSION [name] 07/06/2016   Procedure: TRANSESOPHAGEAL ECHOCARDIOGRAM (TEE);  Surgeon: Kerin Perna, MD;  Location: [facility] OR;  Service: Open Heart Surgery;  Laterality: [name];   TONSILLECTOMY     HPI:  Per chart, pt is a 52 y.o. female with medical history significant of multiple medical issues including Chronic Dysphagia s/p stroke/MBSSs, chronic respiratory failure on 4-6L home O2, multivessel CAD status post CABG, carotid artery disease, R CVA with chronic left-sided deficits and Dysphagia, Mild vascular Dementia, primarily bedbound per chart, hypertension, peripheral artery disease status post stenting, type 2 diabetes, hyperlipidemia, HFpEF complex deep apnea syndrome, Obesity presenting from home with worsening altered mental status and hypoxia.  History is provided by the patient's husband who is at bedside.  Patient's husband found her with her CPAP off and significantly low oxygen level in the 70s.  Placed her on 6 L of oxygen and continued to be hypoxic.  Worsening altered mental status.  Patient had multiple recent hospitalizations for pneumonia.  Also has received steroids recently.  Patient was discharged on 26 July and  12 July prior to that for chronic respiratory failure attributed aspiration pneumonia and was treated.  Pt's hypotension has been suspected to be from autonomic dysfunction patient started Florinef by cardiology along with midodrine EKG changes for her syncopal episode and hypotension.    CT Imaging of the Chest: Multi lobar and dependent bilateral pneumonia, progressed since  07/16/2023 CTA. Consider sequelae of aspiration in this clinical setting.  Pt also has Esophageal dysmotility issues per chart/Imaging.    Assessment / Plan / Recommendation  Clinical Impression   Pt was seen for BSE today. Pt alert, verbal, and agreeable to SLP evaluation. Husband present. On [place] O2 support 4-5L. Pt and Husband immediately commented on repeat admit post recent d/c ~10 days ago. Pt has Baseline Dysphagia and has been rec'd Nectar consistency liquids to reduce risk of aspiration/aspiration pneumonia.  She was seen by this Service 07/04/2023-712/2024 w/ MBSS revealing:  "Patient presents with moderate and chronic pharyngeal phase dysphagia w/ mild oral phase dysphagia. Similar was noted per MBSS 06/2021 s/p R CVA, w/ resulting L sided weakness.  Oral stage is characterized by inconsistnet lip closure w/ escape of thin liquids x2 and min increased bolus preparation and mastication time w/ solids. Bolus containment and anterior  to posterior transit time was functional. Swallow initiation occurs at the level of the pyriform sinuses for thin liquids, spilling to the p.s. for nectar liquids, and the valleculae for remaining consistencies.  Pharyngeal stage is noted for functional tongue base retraction, adequate hyolaryngeal excursion, and adequate pharyngeal constriction. Epiglottic deflection is complete during timely pharyngeal swallows. HOWEVER, w/ thin liquid consistencies and the delay in pharyngeal swallow initiation, laryngeal penetration and aspiration(x1 trial) occurs d/t the decreased pharyngeal sensation and reduced  timing of epiglottic inversion. This results in laryngeal penetration(to the cords x1) and aspiration(x1) w/ thins; coating along underneath side of the epiglottis w/ nectar liquids. Pt exhibited delayed sensation to the aspiration; no apparent sensation to the laryngeal penetration occurring. Instructed pt on using a throat/re-swallow to aid clearing of vestibule. No overt pharyngeal residue noted; pharyngeal stripping wave is complete. Amplitude/duration of cricopharyngeus opening is WFL. There is adequate/complete clearance through the Cervical esophagus.".    Pt given trials of Nectar liquids via Cup, purees/softened solids w/ no immediate, overt clinical s/s of aspiration noted. Pt fed self the sips of Nectar liquids stating they were "ok". No decline in vocal quality or pulmonary status noted; O2 sats remained 98%, RR 20. Pharyngeal swallow appeared timely w/ the Nectar liquids. Oral phase was [facility] for bolus management, mastication of soft solids, and timely A-P transfer. Oral clearing achieved w/ all bolus consistencies. Pt helped to feed self by holding Cup as encouraged.  OM exam appeared at her baseline w/ Min Left orofacial decreased tone. No labial spillage noted. Discussed w/ Both pt and Husband the need for the Nectar consistency liquids based on the results of the MBSSs(since 2022). Also discussed general aspiration precautions w/ any oral intake; Pills in Puree for safer swallowing.  Husband seemed to suggest she drank certain Nectar consistency liquids when he was "there" but was unsure when he was away from the house. Discussed that drinking ANY thin liquids increases risk for aspiration/aspiration pneumonia w/ Both pt and Husband.   In setting of pt's admissions for suspected aspiration pneumonia and her documented oropharyngeal phase Dysphagia per MBSSs, ST services recommends continuing the Dysphagia 3 diet w/ NECTAR liquids via Cup; aspiration precautions. Reduce distractions during  meals; setup and support/Supervision at meals. Pills Whole in Puree.  Recommend ongoing support from Palliative Care to discuss overall GOC in setting of Chronic deficits from prior R CVA, including oropharyngeal phase Dysphagia and repeated aspiration pneumonia dxs(per pt report, chart imaging, MD notes). Dietician f/u as needed.  ST services can be available for any further questions/education re: above while admitted.  MD/NSG updated; agreed. Pt and Husband agreed.  SLP Visit Diagnosis: Dysphagia, oropharyngeal phase (R13.12) (chronic Dysphagia; Cognitive decline baseline; Esophageal phase Dysmotility baseline)    Aspiration Risk  Risk for inadequate nutrition/hydration;Mild aspiration risk;Moderate aspiration risk (reduced when using the Nectar consistency liquids as rec'd)    Diet Recommendation   Nectar;Dysphagia 3 (mechanical soft) (moistened foods) = Dysphagia 3 diet w/ NECTAR liquids via Cup; aspiration precautions. Reduce distractions during meals; setup and support/Supervision at meals. Rest Breaks during meals as needed.  Medication Administration: Whole meds with puree (baseline)    Other  Recommendations Recommended Consults: Consider GI evaluation;Consider esophageal assessment (Palliative Care consult for GOC; Dietician) Oral Care Recommendations: Oral care BID;Oral care before and after PO;Patient independent with oral care (setup) Caregiver Recommendations: Avoid jello, ice cream, thin soups, popsicles;Remove water pitcher;Have oral suction available    Recommendations for follow up therapy are one component of a  multi-disciplinary discharge planning process, led by the attending physician.  Recommendations may be updated based on patient status, additional functional criteria and insurance authorization.  Follow up Recommendations No SLP follow up      Assistance Recommended at Discharge  Intermittent-full  Functional Status Assessment Patient has had a recent decline in  their functional status and/or demonstrates limited ability to make significant improvements in function in a reasonable and predictable amount of time  Frequency and Duration min 1 x/week  1 week       Prognosis Prognosis for improved oropharyngeal function: Guarded (-Fair) Barriers to Reach Goals: Time post onset;Severity of deficits;Behavior;Motivation Barriers/Prognosis Comment: Prior multiple R CVA w/ L sided weakness; Chronic Dysphagia; GERD; Vascular Dementia      Swallow Study   General Date of Onset: 07/30/23 HPI: Per chart, pt is a 52 y.o. female with medical history significant of multiple medical issues including Chronic Dysphagia s/p stroke/MBSSs, chronic respiratory failure on 4-6L home O2, multivessel CAD status post CABG, carotid artery disease, R CVA with chronic left-sided deficits and Dysphagia, Mild vascular Dementia, primarily bedbound per chart, hypertension, peripheral artery disease status post stenting, type 2 diabetes, hyperlipidemia, HFpEF complex deep apnea syndrome, Obesity presenting from home with worsening altered mental status and hypoxia.  History is provided by the patient's husband who is at bedside.  Patient's husband found her with her CPAP off and significantly low oxygen level in the 70s.  Placed her on 6 L of oxygen and continued to be hypoxic.  Worsening altered mental status.  Patient had multiple recent hospitalizations for pneumonia.  Also has received steroids recently.  Patient was discharged on 26 July and 12 July prior to that for chronic respiratory failure attributed aspiration pneumonia and was treated.  Pt's hypotension has been suspected to be from autonomic dysfunction patient started Florinef by cardiology along with midodrine EKG changes for her syncopal episode and hypotension.    CT Imaging of the Chest: Multi lobar and dependent bilateral pneumonia, progressed since  07/16/2023 CTA. Consider sequelae of aspiration in this clinical setting.  Pt  also has Esophageal dysmotility issues per chart/Imaging. Type of Study: Bedside Swallow Evaluation Previous Swallow Assessment: BSE: 07/04/23; MBSS: 07/05/2023- oropharyngeal phase dysphagia w/ aspiration of thins noted; BSE again on 07/17/2023.  MBSS completed 06/2022 indicating oropharyngeal phase dysphagia w/ aspiration of thins noted then - strategies/precautions given to reduce risk; MBSS completed in 2022 in CIR following CVA revealing moderate oral and mild pharyngeal dysphagia. At that time. Diet Prior to this Study: NPO (dysphagia level 3 w/ Nectar liquids rec'd for home use baseline now) Temperature Spikes Noted: Yes (99.9; wbc 21.2) Respiratory Status: Nasal cannula (6L) History of Recent Intubation: No Behavior/Cognition: Alert;Cooperative;Pleasant mood;Distractible;Requires cueing (baseline Cognitive decline per chart) Oral Cavity Assessment: Within Functional Limits Oral Care Completed by SLP: Recent completion by staff Oral Cavity - Dentition: Dentures, top;Dentures, bottom Vision: Functional for self-feeding Self-Feeding Abilities: Able to feed self;Needs assist;Needs set up (holds cup to drink) Patient Positioning: Upright in bed (needed positioning) Baseline Vocal Quality: Normal (soft voice) Volitional Cough: Strong Volitional Swallow: Able to elicit    Oral/Motor/Sensory Function Overall Oral Motor/Sensory Function: Generalized oral weakness (slight+ L sided labial-facial asymmetry - Baseline) Facial ROM: Within Functional Limits Facial Symmetry: Abnormal symmetry left (baseline) Facial Strength: Within Functional Limits Lingual ROM: Within Functional Limits Lingual Symmetry: Within Functional Limits Lingual Strength: Within Functional Limits Mandible: Within Functional Limits   Ice Chips Ice chips: Not tested   Thin Liquid Thin Liquid:  Not tested    Nectar Thick Nectar Thick Liquid: Within functional limits Presentation: Cup;Self Fed (8 trials accepted) Other  Comments: "this is ok"   Honey Thick Honey Thick Liquid: Not tested   Puree Puree: Within functional limits Presentation: Spoon (fed; 7 trials)   Solid     Solid: Impaired Presentation: Spoon (fed; 4 trials) Oral Phase Impairments: Impaired mastication (min slower but grossly functional - suspect baseline per previous notes) Oral Phase Functional Implications: Impaired mastication (min) Pharyngeal Phase Impairments:  (no overt)        Erika Som, MS, CCC-SLP Speech Language Pathologist Rehab Services; [facility] [phone] (ascom) , 07/30/2023,4:02 PM

## 2023-07-30 NOTE — ED Triage Notes (Signed)
Pt comes via EMs from home with c/o abnormal breathing. Pt speaking in clear sentences. Pt is on 4L normally. Pt currently being treated for pneumonia. Pt has hx of stroke, left deficit and bebound.   Pt just states she feels weird.

## 2023-07-30 NOTE — Consult Note (Signed)
Cardiology Consultation   Patient ID: Erika [name] MRN: [id number]; DOB: 29-Aug-1971  Admit date: 07/30/2023 Date of Consult: 07/30/2023  PCP:  Eden Emms, NP    HeartCare Providers Cardiologist:  Lorine Bears, MD        Patient Profile:   Erika [name] is a 52 y.o. female with a hx of coronary artery disease status post CABG and right carotid endarterectomy in 2017 in the setting of non-STEMI, PAD,, labile hypertension, chronic HFpEF, mixed hyperlipidemia, and type 2 diabetes mellitus who is being seen 07/30/2023 for the evaluation of elevated troponin at the request of Dr. Belia Heman.Marland Kitchen  History of Present Illness:   Erika [name] has had recurrent hospitalizations for pneumonia over the last few months.  She was just discharged from [facility] with bilateral pneumonias with suspicion for aspiration on 07/19/2023.  3 to 4 days ago, she began to experience worsening shortness of breath consistent with what she has felt in the past with her pneumonia.  She denies having chest pain, palpitations, orthopnea, and edema.  She notes that her husband is very vigilant about her health care and that he had to increase her supplemental oxygen (she typically wears 4 L during the day and 6 L at night) and was still unable to get her saturations up.  She therefore presented to the emergency department today and has been diagnosed with bilateral healthcare associated pneumonias.  She has been started on empiric antibiotics and already feels better.  As part of her workup in the ED, troponins were checked and were found to be elevated at 136->450->991->918.   Past Medical History:  Diagnosis Date   Acute respiratory failure with hypoxia (HCC) 06/24/2021   Arthralgia of temporomandibular joint    CAD, multiple vessel    a. 06/2016 Cath: ostLM 40%, ostLAD 40%, pLAD 95%, ost-pLCx 60%, pLCx 95%, mLCx 60%, mRCA 95%, D2 50%, LVSF nl;  b. 07/2016 CABG x 4 (LIMA->LAD, VG->Diag, VG->OM, VG->RCA); c.  08/2016 Cath: 3VD w/ 4/4 patent grafts. LAD distal to LIMA has diff dzs->Med rx; d. 08/2020 Cath: 4/4 patent grafts, native 3VD. EF 55-65%-->Med Rx.   Carotid arterial disease (HCC)    a. 07/2016 s/p R CEA; b. 02/2021 U/S: RICA 40-59%, LICA 1-39%.   Cerebrovascular disease    Clotting disorder (HCC)    Complex sleep apnea syndrome 10/03/2022   Depression    Diastolic dysfunction    a. 06/2016 Echo: EF 50-55%, mild inf wall HK, GR1DD, mild MR, RV sys fxn nl, mildly dilated LA, PASP nl; b. 06/2021 Echo: EF 60-65%, no rwma. Nl RV fxn.   Essential hypertension    Essential hypertension, malignant 06/28/2016   Fatty liver disease, nonalcoholic 2016   History of blood transfusion    with heart surgery   History of MI (myocardial infarction) (July 2017) 08/02/2020   HLD (hyperlipidemia)    HTN (hypertension), malignant 10/20/2013   Labile hypertension    a. prior renal ngiogram negative for RAS in 03/2016; b. catecholamines and metanephrines normal, mildly elevated renin with normal aldosterone and normal ratio in 02/2016   Mild tobacco abuse in early remission 06/28/2016   Myocardial infarction [facility]) 2017   Obesity    PAD (peripheral artery disease) (HCC)    a. 09/2018 s/p L SFA stenting; b. 07/2019 Periph Angio: Patent m/d L SFA stent w/ 100% L SFA distal to stent. L AT 100d, L Peroneal diff dzs-->Med Rx; c. 02/2021 ABIs: stable @ 0.61 on R and 0.46 on  L.   PTSD (post-traumatic stress disorder)    Reflux gastritis 07/01/2023   Stroke (HCC)    Tobacco abuse    Type 2 diabetes mellitus (HCC) 12/2015    Past Surgical History:  Procedure Laterality Date   ABDOMINAL AORTOGRAM W/LOWER EXTREMITY [name] 10/15/2018   Procedure: ABDOMINAL AORTOGRAM W/LOWER EXTREMITY;  Surgeon: Iran Ouch, MD;  Location: [facility];  Service: Cardiovascular;  Laterality: [name];   ABDOMINAL AORTOGRAM W/LOWER EXTREMITY Bilateral 08/19/2019   Procedure: ABDOMINAL AORTOGRAM W/LOWER EXTREMITY;  Surgeon: Iran Ouch, MD;  Location: [facility];  Service: Cardiovascular;  Laterality: Bilateral;   CARDIAC CATHETERIZATION [name] 06/29/2016   Procedure: Left Heart Cath and Coronary Angiography;  Surgeon: Antonieta Iba, MD;  Location: [facility] INVASIVE CV LAB;  Service: Cardiovascular;  Laterality: [name];   CARDIAC CATHETERIZATION [name] 08/29/2016   Procedure: Left Heart Cath and Cors/Grafts Angiography;  Surgeon: Iran Ouch, MD;  Location: [facility];  Service: Cardiovascular;  Laterality: [name];   CESAREAN SECTION     CHOLECYSTECTOMY     CORONARY ARTERY BYPASS GRAFT [name] 07/06/2016   Procedure: CORONARY ARTERY BYPASS GRAFTING (CABG) x four, using left internal mammary artery and right leg greater saphenous vein harvested endoscopically;  Surgeon: Kerin Perna, MD;  Location: [facility] OR;  Service: Open Heart Surgery;  Laterality: [name];   ENDARTERECTOMY Right 07/06/2016   Procedure: ENDARTERECTOMY CAROTID;  Surgeon: Larina Earthly, MD;  Location: [facility] OR;  Service: Vascular;  Laterality: Right;   ENDARTERECTOMY Right 04/27/2020   Procedure: REDO OF RIGHT ENDARTERECTOMY CAROTID;  Surgeon: Larina Earthly, MD;  Location: [facility] OR;  Service: Vascular;  Laterality: Right;   LEFT HEART CATH AND CORS/GRAFTS ANGIOGRAPHY [name] 08/24/2020   Procedure: LEFT HEART CATH AND CORS/GRAFTS ANGIOGRAPHY;  Surgeon: Iran Ouch, MD;  Location: [facility];  Service: Cardiovascular;  Laterality: [name];   LEFT HEART CATH AND CORS/GRAFTS ANGIOGRAPHY [name] 01/29/2022   Procedure: LEFT HEART CATH AND CORS/GRAFTS ANGIOGRAPHY;  Surgeon: Iran Ouch, MD;  Location: [facility] CV [facility];  Service: Cardiovascular;  Laterality: [name];   PERIPHERAL VASCULAR CATHETERIZATION [name] 04/18/2016   Procedure: Renal Angiography;  Surgeon: Iran Ouch, MD;  Location: [facility];  Service: Cardiovascular;  Laterality: [name];   PERIPHERAL VASCULAR INTERVENTION Left 10/15/2018   Procedure: PERIPHERAL VASCULAR INTERVENTION;  Surgeon: Iran Ouch, MD;  Location: [facility];  Service: Cardiovascular;  Laterality: Left;  Left superficial femoral   TEE WITHOUT CARDIOVERSION [name] 07/06/2016   Procedure: TRANSESOPHAGEAL ECHOCARDIOGRAM (TEE);  Surgeon: Kerin Perna, MD;  Location: [facility] OR;  Service: Open Heart Surgery;  Laterality: [name];   TONSILLECTOMY       Inpatient Medications: Scheduled Meds:  arformoterol  15 mcg Nebulization BID   budesonide (PULMICORT) nebulizer solution  0.5 mg Nebulization BID   methylPREDNISolone (SOLU-MEDROL) injection  80 mg Intravenous Daily   mouth rinse  15 mL Mouth Rinse 4 times per day   revefenacin  175 mcg Nebulization Daily   Continuous Infusions:  ceFEPime (MAXIPIME) IV     norepinephrine (LEVOPHED) Adult infusion Stopped (07/30/23 1725)   vancomycin     PRN Meds: acetaminophen, lip balm, mouth rinse  Allergies:   No Known Allergies  Social History:   Social History   Socioeconomic History   Marital status: Married    Spouse name: Not on file   Number of children: 1   Years of education: 71  Highest education level: Not on file  Occupational History   Occupation: Disability  Tobacco Use   Smoking status: Former    Current packs/day: 0.00    Average packs/day: 0.5 packs/day for 30.0 years (15.0 ttl pk-yrs)    Types: Cigarettes    Start date: 9    Quit date: 2022    Years since quitting: 2.5    Passive exposure: Never   Smokeless tobacco: Never  Vaping Use   Vaping status: Every Day   Substances: Flavoring  Substance and Sexual Activity   Alcohol use: Not Currently    Alcohol/week: 0.0 standard drinks of alcohol    Comment: socially   Drug use: No   Sexual activity: Yes    Partners: Male    Birth control/protection: None  Other Topics Concern   Not on file  Social History Narrative   11/21/20   From: [name]   Living: with husband, [name] (2009) and special needs daughter    Work: disability       Family: daughter - [name] (special needs,  lives with her) and 2 grown step children - [name] and [name] Client       Enjoys: play with dogs - 2 german Shepard, mut, and blue tick walker mix      Exercise: PAD limits exercise   Diet: diabetic diet and low potassium due to daughter      Safety   Seat belts: Yes    Guns: Yes  and secure   Safe in relationships: Yes    Social Determinants of Health   Financial Resource Strain: Not on file  Food Insecurity: No Food Insecurity (07/29/2023)   Hunger Vital Sign    Worried About Running Out of Food in the Last Year: Never true    Ran Out of Food in the Last Year: Never true  Transportation Needs: No Transportation Needs (07/29/2023)   PRAPARE - Administrator, Civil Service (Medical): No    Lack of Transportation (Non-Medical): No  Physical Activity: Not on file  Stress: Not on file  Social Connections: Not on file  Intimate Partner Violence: Not At Risk (07/17/2023)   Humiliation, Afraid, Rape, and Kick questionnaire    Fear of Current or Ex-Partner: No    Emotionally Abused: No    Physically Abused: No    Sexually Abused: No    Family History:   Family History  Adopted: Yes  Problem Relation Age of Onset   Diabetes Mother    Diabetes Father    Alcohol abuse Father    Heart disease Father    Drug abuse Father    Stroke Sister    Anxiety disorder Sister      ROS:  Please see the history of present illness. All other ROS reviewed and negative.     Physical Exam/Data:   Vitals:   07/30/23 1500 07/30/23 1600 07/30/23 1630 07/30/23 1700  BP: (!) 122/47 (!) 109/49 (!) 99/39 (!) 76/65  Pulse: 78 69 71 98  Resp: (!) 21 15 17 15   Temp:      TempSrc:      SpO2:  96% 96% 93%  Weight:      Height:        Intake/Output Summary (Last 24 hours) at 07/30/2023 1743 Last data filed at 07/30/2023 1724 Gross per 24 hour  Intake 3066.89 ml  Output 0 ml  Net 3066.89 ml      07/30/2023    1:40 PM 07/26/2023  3:42 PM 07/23/2023    1:22 PM  Last 3 Weights  Weight (lbs)  194 lb 10.7 oz 197 lb 9.6 oz 196 lb  Weight (kg) 88.3 kg 89.631 kg 88.905 kg     Body mass index is 30.49 kg/m.  General:  Well nourished, well developed, in no acute distress HEENT: normal Neck: no JVD Vascular: Well-healed scar noted overlying the right carotid artery. Cardiac:  normal S1, S2; RRR; no murmur Lungs: Bilateral crackles with diminished breath sounds at the lung bases Abd: soft, nontender, no hepatomegaly  Ext: no edema Musculoskeletal:  No deformities, BUE and BLE strength normal and equal Skin: warm and dry   EKG:  The EKG was personally reviewed and demonstrates: Normal sinus rhythm with nonspecific ST/T changes, not significantly changed from prior tracing on 07/16/2023. Telemetry:  Telemetry was personally reviewed and demonstrates: Normal sinus rhythm  Relevant CV Studies: TTE (07/17/2023): Normal LV size with mild LVH.  LVEF 60-65% normal wall motion and grade 1 diastolic dysfunction.  Normal RV size and function.  Normal biatrial size.  No pericardial effusion.  Mild mitral annular calcification without regurgitation or stenosis.  No tricuspid regurgitation.  Aortic sclerosis without stenosis present.  E.  Negative bubble study.  Laboratory Data:  High Sensitivity Troponin:   Recent Labs  Lab 07/03/23 1126 07/16/23 2058 07/17/23 0109 07/30/23 0838 07/30/23 1130  TROPONINIHS 5 11 9  136* 450*     Chemistry Recent Labs  Lab 07/26/23 1628 07/30/23 0838 07/30/23 0912  NA 139 135 136  K 4.0 4.1 4.4  CL 100 97* 96*  CO2 32 30 31  GLUCOSE 146* 128* 114*  BUN 10 12 12   CREATININE 0.74 0.98 0.88  CALCIUM 9.5 9.2 9.4  GFRNONAA  --  >60 >60  ANIONGAP  --  8 9    Recent Labs  Lab 07/26/23 1628 07/30/23 0912  PROT 6.1 6.5  ALBUMIN  --  3.4*  AST 20 30  ALT 22 16  ALKPHOS  --  65  BILITOT 0.2 0.9   Lipids No results for input(s): "CHOL", "TRIG", "HDL", "LABVLDL", "LDLCALC", "CHOLHDL" in the last 168 hours.  Hematology Recent Labs  Lab  07/26/23 1628 07/30/23 0838  WBC 9.6 21.1*  RBC 4.41 4.19  HGB 9.5* 9.1*  HCT 31.1* 31.2*  MCV 70.5* 74.5*  MCH 21.5* 21.7*  MCHC 30.5* 29.2*  RDW 19.4* 23.1*  PLT 421* 374   Thyroid No results for input(s): "TSH", "FREET4" in the last 168 hours.  BNP Recent Labs  Lab 07/30/23 0838  BNP 90.1    DDimer No results for input(s): "DDIMER" in the last 168 hours.   Radiology/Studies:  CT Angio Chest Pulmonary Embolism (PE) W or WO Contrast  Result Date: 07/30/2023 CLINICAL DATA:  52 year old female with altered mental status, abnormal breathing, undergoing treatment for pneumonia. History of previous cerebral infarct with left side deficit. EXAM: CT ANGIOGRAPHY CHEST WITH CONTRAST TECHNIQUE: Multidetector CT imaging of the chest was performed using the standard protocol during bolus administration of intravenous contrast. Multiplanar CT image reconstructions and MIPs were obtained to evaluate the vascular anatomy. RADIATION DOSE REDUCTION: This exam was performed according to the departmental dose-optimization program which includes automated exposure control, adjustment of the mA and/or kV according to patient size and/or use of iterative reconstruction technique. CONTRAST:  75mL OMNIPAQUE IOHEXOL 350 MG/ML SOLN COMPARISON:  Portable chest 0918 hours today.  Prior CTA 07/16/2023. Speech pathology evaluation 07/05/2023. FINDINGS: Cardiovascular: Good contrast bolus timing in the pulmonary  arterial tree. No pulmonary artery filling defect. Prior CABG. Calcified aortic atherosclerosis. Cardiac size remains within normal limits. No pericardial effusion. Mediastinum/Nodes: Postoperative changes. Reactive appearing mediastinal lymph nodes including right paratracheal, similar to last month. No mediastinal mass or suspicious lymphadenopathy. Lungs/Pleura: Major airways are patent with some atelectatic changes. But there is bilateral lower lobe consolidation with air bronchograms, similar to but  progressed since 07/16/2023. No pleural effusions. And there is similar dependent peribronchial opacity now in the middle lobes, additional patchy and nodular middle lobe bronchovascular involvement, and early mostly dependent peribronchial involvement also in the upper lobes. Upper Abdomen: Negative visible liver, spleen, pancreas, adrenal glands and bowel in the upper abdomen. Musculoskeletal: Sternotomy. No acute osseous abnormality identified. Review of the MIP images confirms the above findings. IMPRESSION: 1. Negative for acute pulmonary embolus. 2. Multi lobar and dependent bilateral pneumonia, progressed since 07/16/2023 CTA. Consider sequelae of aspiration in this clinical setting. 3.  Aortic Atherosclerosis (ICD10-I70.0).  Prior CABG. Electronically Signed   By: Odessa Fleming M.D.   On: 07/30/2023 11:13   CT Head Wo Contrast  Result Date: 07/30/2023 CLINICAL DATA:  52 year old female with altered mental status, abnormal breathing, undergoing treatment for pneumonia. History of previous cerebral infarct with left side deficit. EXAM: CT HEAD WITHOUT CONTRAST TECHNIQUE: Contiguous axial images were obtained from the base of the skull through the vertex without intravenous contrast. RADIATION DOSE REDUCTION: This exam was performed according to the departmental dose-optimization program which includes automated exposure control, adjustment of the mA and/or kV according to patient size and/or use of iterative reconstruction technique. COMPARISON:  Brain MRI 07/16/2023.  Head CT 06/11/2023. FINDINGS: Brain: Right parietal and occipital lobe cortical encephalomalacia, gliosis appears stable. And there is chronic right brainstem atrophy, confirmed on the MRI last month. No superimposed No midline shift, ventriculomegaly, mass effect, evidence of mass lesion, intracranial hemorrhage or evidence of cortically based acute infarction. Gray-white matter differentiation is within normal limits throughout the brain.  Vascular: No suspicious intracranial vascular hyperdensity. Calcified atherosclerosis at the skull base. Skull: Intact.  No acute osseous abnormality identified. Sinuses/Orbits: Visualized paranasal sinuses and mastoids are stable and well aerated. Other: Visualized orbits and scalp soft tissues are within normal limits. IMPRESSION: 1. No acute intracranial abnormality. 2. Chronic cortical encephalomalacia in the right parietal and occipital lobes, and moderate brainstem atrophy and/or Wallerian degeneration - greater on the right. Electronically Signed   By: Odessa Fleming M.D.   On: 07/30/2023 10:59   DG Chest Port 1 View  Result Date: 07/30/2023 CLINICAL DATA:  Abnormal breathing EXAM: PORTABLE CHEST 1 VIEW COMPARISON:  CXR 07/16/23 FINDINGS: Status post median sternotomy and CABG. Unchanged cardiac and mediastinal contours. No large pleural effusion. No pneumothorax. There are prominent bilateral interstitial opacities there are somewhat basal predominant. Findings could represent pulmonary edema or multifocal infection. No radiographically apparent displaced rib fractures. Visualized upper abdomen is unremarkable. IMPRESSION: Prominent bilateral interstitial opacities, which are somewhat basal predominant. Findings could represent pulmonary edema or multifocal infection. Electronically Signed   By: Lorenza Cambridge M.D.   On: 07/30/2023 09:29     Assessment and Plan:   Elevated troponin: Ms. Marik presents with worsening shortness of breath and hypoxia in the setting of recurrent bilateral pneumonias.  She has not had any angina.  Her EKGs show stable chronic nonspecific ST segment changes.  I suspect that her elevated troponin reflects supply-demand mismatch, though progression of her severe underlying CAD cannot be entirely excluded.  She is reluctant to pursue  invasive procedures such as cardiac catheterization, which I think is reasonable, particularly with her recurrent respiratory infections.  I will  resume aspirin but hold clopidogrel for the time being to ensure that she does not have a drop in her hemoglobin.  I will initiate IV heparin, to be continued for 48 hours for medical management of NSTEMI (likely type II MI).  However, if hemoglobin trends down, low threshold for discontinuation of heparin in the setting of her chronic anemia.  If she were to have angina and/or dynamic EKG changes, cardiac catheterization would need to be readdressed.  Septic shock: Patient was initially hypotensive but has been weaned off norepinephrine.  Agree with holding metoprolol for now.  Ongoing management per CCM.  HCAP and acute on chronic respiratory failure with hypoxia: Ms. Praytor reports that her breathing is improving with antimicrobial therapy.  Continue empiric antibiotics and respiratory support per article care team.  Mixed hyperlipidemia: Patient is on evolocumab and Vascepa at home.  Okay to defer these medications while hospitalized; they should be resumed after discharge.  For questions or updates, please contact [facility] Please consult [email or web address] for contact info under [facility].   Signed, Yvonne Kendall, MD  07/30/2023 5:43 PM

## 2023-07-30 NOTE — Consult Note (Signed)
Pharmacy Antibiotic Note  Erika [name] is a 52 y.o. female with PMH including DM, CAD s/p CABG, carotid stenosis, PVD, chronic pain, CVA, depression, COPD, recent hospitalization 7/23 - 7/26 for orthostatic hypotension and aspiration pneumonia treated with ceftriaxone and discharged on Augmentin admitted on 07/30/2023 with pneumonia.  Pharmacy has been consulted for cefepime and vancomycin dosing.  Plan:  Cefepime 2 g IV q8h  Vancomycin 1.75 g (20 mg/kg) IV LD followed by vancomycin 1 g IV q12h --Calculated AUC: 480, Cmin: 14.2 --Daily Scr per protocol --Levels at steady state or as clinically indicated  Temp (24hrs), Avg:99 F (37.2 C), Min:98 F (36.7 C), Max:99.9 F (37.7 C)  Recent Labs  Lab 07/26/23 1628 07/30/23 0838 07/30/23 0912 07/30/23 0913 07/30/23 0941  WBC 9.6 21.1*  --   --   --   CREATININE 0.74 0.98 0.88  --   --   LATICACIDVEN  --   --   --  1.3 1.0    Estimated Creatinine Clearance: 87.2 mL/min (by C-G formula based on SCr of 0.88 mg/dL).    No Known Allergies  Antimicrobials this admission: Cefepime 8/6 >>  Vancomycin 8/6 >>   Dose adjustments this admission: [name]  Microbiology results: 8/6 BCx: pending 8/6 MRSA PCR: pending  Thank you for allowing pharmacy to be a part of this patient's care.  Tressie Ellis 07/30/2023 1:16 PM

## 2023-07-30 NOTE — ED Provider Notes (Signed)
[facility] Provider Note    Event Date/Time   First MD Initiated Contact with Patient 07/30/23 [phone]     (approximate)   History   Shortness of Breath   HPI  ELEXA LATZKE is a 52 y.o. female past medical history significant for chronic respiratory failure on 6 L of home oxygen, hypertension, hyperlipidemia, obesity, OSA, who presents to the emergency department with worsening altered mental status and hypoxia.  History is provided by the patient's husband who is at bedside.  Patient's husband found her with her CPAP off and significantly low oxygen level in the 70s.  Placed her on 6 L of oxygen and continued to be hypoxic.  Worsening altered mental status.  Patient had multiple recent hospitalizations for pneumonia.  Also has received steroids recently.  Patient is currently full code, she was DNR/DNI however she wants to be an organ donor for possible donation of her kidney to her daughter which requires her to be intubated according to her husband at bedside.     Physical Exam   Triage Vital Signs: ED Triage Vitals  Encounter Vitals Group     BP 07/30/23 0846 (!) 89/61     Systolic BP Percentile --      Diastolic BP Percentile --      Pulse Rate 07/30/23 0846 96     Resp 07/30/23 0846 (!) 22     Temp 07/30/23 0846 98 F (36.7 C)     Temp src --      SpO2 07/30/23 0846 (!) 85 %     Weight --      Height --      Head Circumference --      Peak Flow --      Pain Score 07/30/23 0833 0     Pain Loc --      Pain Education --      Exclude from Growth Chart --     Most recent vital signs: Vitals:   07/30/23 1255 07/30/23 1304  BP: (!) 109/51   Pulse: 78   Resp: 12   Temp:  99.9 F (37.7 C)  SpO2: 91%     Physical Exam Constitutional:      General: She is in acute distress.     Appearance: She is well-developed. She is ill-appearing.  HENT:     Head: Atraumatic.  Eyes:     Extraocular Movements: Extraocular movements intact.      Conjunctiva/sclera: Conjunctivae normal.     Pupils: Pupils are equal, round, and reactive to light.  Cardiovascular:     Rate and Rhythm: Regular rhythm.  Pulmonary:     Effort: Respiratory distress present.     Breath sounds: Wheezing present.     Comments: 87% on 6 L nasal cannula Abdominal:     General: There is no distension.     Palpations: Abdomen is soft.     Tenderness: There is no abdominal tenderness.  Musculoskeletal:        General: Normal range of motion.     Cervical back: Normal range of motion.     Right lower leg: No edema.     Left lower leg: No edema.  Skin:    General: Skin is warm.  Neurological:     Mental Status: She is alert. Mental status is at baseline. She is disoriented.     IMPRESSION / MDM / ASSESSMENT AND PLAN / ED COURSE  I reviewed the triage vital signs and  the nursing notes.  On arrival to the emergency department patient significantly hypotensive, lowest MAP 70/60.  Significant hypoxia requiring 6 L nasal cannula.  Patient has a home 4 to 6 L of home oxygen use.  On chart review multiple recent hospitalizations and was just discharged on 07/19/2023  Differential diagnosis including pulmonary embolism, no pneumonia, COVID, ACS  EKG  I, Corena Herter, the attending physician, personally viewed and interpreted this ECG.   Rate: Normal  Rhythm: Normal sinus  Axis: Normal  Intervals: Normal  ST&T Change: ST depression to the lateral leads.  No significant ST elevation.  No tachycardic or bradycardic dysrhythmias while on cardiac telemetry.  RADIOLOGY my interpretation of imaging: Bilateral lower lobe pneumonia on CT scan. Read as negative for acute pulmonary embolism.  Multilobar bilateral pulmonary pneumonia that has progressed from recent CT   LABS (all labs ordered are listed, but only abnormal results are displayed) Labs interpreted as -    Labs Reviewed  CBC - Abnormal; Notable for the following components:      Result Value    WBC 21.1 (*)    Hemoglobin 9.1 (*)    HCT 31.2 (*)    MCV 74.5 (*)    MCH 21.7 (*)    MCHC 29.2 (*)    RDW 23.1 (*)    All other components within normal limits  BASIC METABOLIC PANEL - Abnormal; Notable for the following components:   Chloride 97 (*)    Glucose, Bld 128 (*)    All other components within normal limits  COMPREHENSIVE METABOLIC PANEL - Abnormal; Notable for the following components:   Chloride 96 (*)    Glucose, Bld 114 (*)    Albumin 3.4 (*)    All other components within normal limits  BLOOD GAS, VENOUS - Abnormal; Notable for the following components:   Bicarbonate 33.1 (*)    Acid-Base Excess 6.3 (*)    All other components within normal limits  URINALYSIS, W/ REFLEX TO CULTURE (INFECTION SUSPECTED) - Abnormal; Notable for the following components:   Color, Urine AMBER (*)    APPearance CLOUDY (*)    Bacteria, UA RARE (*)    All other components within normal limits  PROTIME-INR - Abnormal; Notable for the following components:   Prothrombin Time 16.0 (*)    INR 1.3 (*)    All other components within normal limits  APTT - Abnormal; Notable for the following components:   aPTT 41 (*)    All other components within normal limits  TROPONIN I (HIGH SENSITIVITY) - Abnormal; Notable for the following components:   Troponin I (High Sensitivity) 136 (*)    All other components within normal limits  TROPONIN I (HIGH SENSITIVITY) - Abnormal; Notable for the following components:   Troponin I (High Sensitivity) 450 (*)    All other components within normal limits  SARS CORONAVIRUS 2 BY RT PCR  CULTURE, BLOOD (ROUTINE X 2)  CULTURE, BLOOD (ROUTINE X 2)  MRSA NEXT GEN BY PCR, NASAL  LACTIC ACID, PLASMA  LACTIC ACID, PLASMA  PROTIME-INR  APTT  BRAIN NATRIURETIC PEPTIDE  POC URINE PREG, ED     MDM  Blood cultures obtained.  Felt that 30 cc/kg of IV fluids may be detrimental to the patient given her history of heart failure and hypoxia, given 1 L of IV  fluid and will reevaluate.  Started on antibiotics to cover for hospital-acquired pneumonia.  On reevaluation has persistent hypotension, started on peripheral Levophed and uptitrated to  a rate of 4 mg  Clinical picture concerning for hospital-acquired pneumonia with altered mental status.  Improvement of blood pressure following Levophed.  No significant hypercarbia on blood gas.  Initial troponin significantly elevated, most likely secondary to demand ischemia from her hypoxia and pneumonia.  No EKG changes.  Will continue to trend.  CT scan of the head without signs of intracranial hemorrhage.  COVID-negative.  Normal lactic acid.  Consulted ICU and discussed with Dr. Belia Heman who admitted the patient to the ICU for acute hypoxic respiratory failure and hypotension in the setting of hospital-acquired pneumonia.     PROCEDURES:  Critical Care performed: yes  .Critical Care  Performed by: Corena Herter, MD Authorized by: Corena Herter, MD   Critical care provider statement:    Critical care time (minutes):  45   Critical care time was exclusive of:  Separately billable procedures and treating other patients   Critical care was necessary to treat or prevent imminent or life-threatening deterioration of the following conditions:  Respiratory failure   Critical care was time spent personally by me on the following activities:  Development of treatment plan with patient or surrogate, discussions with consultants, evaluation of patient's response to treatment, examination of patient, ordering and review of laboratory studies, ordering and review of radiographic studies, ordering and performing treatments and interventions, pulse oximetry, re-evaluation of patient's condition and review of old charts   Patient's presentation is most consistent with acute presentation with potential threat to life or bodily function.   MEDICATIONS ORDERED IN ED: Medications  norepinephrine (LEVOPHED) 4mg  in  (0.016 mg/mL) premix infusion (3 mcg/min Intravenous Rate/Dose Change 07/30/23 1142)  arformoterol (BROVANA) nebulizer solution 15 mcg (has no administration in time range)  revefenacin (YUPELRI) nebulizer solution 175 mcg (has no administration in time range)  budesonide (PULMICORT) nebulizer solution 0.5 mg (has no administration in time range)  ceFEPIme (MAXIPIME) 2 g in sodium chloride 0.9 % 100 mL IVPB (has no administration in time range)  vancomycin (VANCOCIN) IVPB 1000 mg/200 mL premix (has no administration in time range)  lactated ringers bolus 1,000 mL (0 mLs Intravenous Stopped 07/30/23 1303)  vancomycin (VANCOCIN) IVPB 1000 mg/200 mL premix (0 mg Intravenous Stopped 07/30/23 1211)  ceFEPIme (MAXIPIME) 2 g in sodium chloride 0.9 % 100 mL IVPB (0 g Intravenous Stopped 07/30/23 1051)  vancomycin (VANCOREADY) IVPB 750 mg/150 mL (0 mg Intravenous Stopped 07/30/23 1324)  iohexol (OMNIPAQUE) 350 MG/ML injection 75 mL (75 mLs Intravenous Contrast Given 07/30/23 1036)  ipratropium-albuterol (DUONEB) 0.5-2.5 (3) MG/3ML nebulizer solution 3 mL (3 mLs Nebulization Given 07/30/23 1220)    FINAL CLINICAL IMPRESSION(S) / ED DIAGNOSES   Final diagnoses:  Hypoxia  SOB (shortness of breath)  HCAP (healthcare-associated pneumonia)     Rx / DC Orders   ED Discharge Orders     None        Note:  This document was prepared using Dragon voice recognition software and may include unintentional dictation errors.   Corena Herter, MD 07/30/23 1332

## 2023-07-30 NOTE — Progress Notes (Signed)
CODE SEPSIS - PHARMACY COMMUNICATION  **Broad Spectrum Antibiotics should be administered within 1 hour of Sepsis diagnosis**  Time Code Sepsis Called/Page Received: 1004  Antibiotics Ordered: Vancomycin and Cefepime  Time of 1st antibiotic administration: 1021  Additional action taken by pharmacy: None  If necessary, Name of Provider/Nurse Contacted: None    Rockwell Alexandria, PharmD Clinical Pharmacist  07/30/2023  10:28 AM

## 2023-07-30 NOTE — Consult Note (Addendum)
ANTICOAGULATION CONSULT NOTE - Initial Consult  Pharmacy Consult for Heparin infusion Indication: chest pain/ACS  No Known Allergies  Patient Measurements: Height: 5\' 7"  (170.2 cm) Weight: 88.3 kg (194 lb 10.7 oz) IBW/kg (Calculated) : 61.6 Heparin Dosing Weight: 80.4 kg  Vital Signs: Temp: 99.9 F (37.7 C) (08/06 1304) Temp Source: Oral (08/06 1304) BP: 109/55 (08/06 1900) Pulse Rate: 64 (08/06 1900)  Labs: Recent Labs    07/30/23 0838 07/30/23 0912 07/30/23 1110 07/30/23 1130 07/30/23 1711 07/30/23 1821  HGB 9.1*  --   --   --   --   --   HCT 31.2*  --   --   --   --   --   PLT 374  --   --   --   --   --   APTT  --  SPECIMEN CLOTTED 41*  --   --   --   LABPROT  --  SPECIMEN CLOTTED 16.0*  --   --   --   INR  --  SPECIMEN CLOTTED 1.3*  --   --   --   CREATININE 0.98 0.88  --   --   --   --   TROPONINIHS 136*  --   --  450* 991* 918*    Estimated Creatinine Clearance: 88.3 mL/min (by C-G formula based on SCr of 0.88 mg/dL).   Medical History: Past Medical History:  Diagnosis Date   Acute respiratory failure with hypoxia (HCC) 06/24/2021   Arthralgia of temporomandibular joint    CAD, multiple vessel    a. 06/2016 Cath: ostLM 40%, ostLAD 40%, pLAD 95%, ost-pLCx 60%, pLCx 95%, mLCx 60%, mRCA 95%, D2 50%, LVSF nl;  b. 07/2016 CABG x 4 (LIMA->LAD, VG->Diag, VG->OM, VG->RCA); c. 08/2016 Cath: 3VD w/ 4/4 patent grafts. LAD distal to LIMA has diff dzs->Med rx; d. 08/2020 Cath: 4/4 patent grafts, native 3VD. EF 55-65%-->Med Rx.   Carotid arterial disease (HCC)    a. 07/2016 s/p R CEA; b. 02/2021 U/S: RICA 40-59%, LICA 1-39%.   Cerebrovascular disease    Clotting disorder (HCC)    Complex sleep apnea syndrome 10/03/2022   Depression    Diastolic dysfunction    a. 06/2016 Echo: EF 50-55%, mild inf wall HK, GR1DD, mild MR, RV sys fxn nl, mildly dilated LA, PASP nl; b. 06/2021 Echo: EF 60-65%, no rwma. Nl RV fxn.   Essential hypertension    Essential hypertension, malignant  06/28/2016   Fatty liver disease, nonalcoholic 2016   History of blood transfusion    with heart surgery   History of MI (myocardial infarction) (July 2017) 08/02/2020   HLD (hyperlipidemia)    HTN (hypertension), malignant 10/20/2013   Labile hypertension    a. prior renal ngiogram negative for RAS in 03/2016; b. catecholamines and metanephrines normal, mildly elevated renin with normal aldosterone and normal ratio in 02/2016   Mild tobacco abuse in early remission 06/28/2016   Myocardial infarction [facility]) 2017   Obesity    PAD (peripheral artery disease) (HCC)    a. 09/2018 s/p L SFA stenting; b. 07/2019 Periph Angio: Patent m/d L SFA stent w/ 100% L SFA distal to stent. L AT 100d, L Peroneal diff dzs-->Med Rx; c. 02/2021 ABIs: stable @ 0.61 on R and 0.46 on L.   PTSD (post-traumatic stress disorder)    Reflux gastritis 07/01/2023   Stroke (HCC)    Tobacco abuse    Type 2 diabetes mellitus (HCC) 12/2015    Medications:  No  AC prior to admission. Last dose of Plavix 07/29/2023  Assessment: Patient with hx of CAD s/p CABG in setting of NSTEMI, PAD, labile hypertension, chronic HFpEF, mixed hyperlipidemia, and type 2 diabetes mellitus. Admitted with SOB and elevated troponin. Pharmacy consulted to manage heparin infusion for ACS.  Baseline PLT WNL. H?H low but improved in last 2 weeks. No S/Sx of bleeding noted per chart review  Goal of Therapy:  Heparin level 0.3-0.7 units/ml Monitor platelets by anticoagulation protocol: Yes   Plan:  Give 4000 units bolus x 1 Start heparin infusion at 1000 units/hr Check anti-Xa level in 6 hours and daily while on heparin Continue to monitor H&H and platelets   Rodriguez-Guzman PharmD, BCPS 07/30/2023 8:03 PM

## 2023-07-30 NOTE — Progress Notes (Signed)
PHARMACY -  BRIEF ANTIBIOTIC NOTE   Pharmacy has received consult(s) for sepsis from an ED provider.  The patient's profile has been reviewed for ht/wt/allergies/indication/available labs.    One time order(s) placed for vancomycin and cefepime.  Further antibiotics/pharmacy consults should be ordered by admitting physician if indicated.                       Thank you for involving pharmacy in this patient's care.   Rockwell Alexandria, PharmD Clinical Pharmacist 07/30/2023 10:09 AM

## 2023-07-30 NOTE — Plan of Care (Signed)

## 2023-07-30 NOTE — ED Notes (Signed)
Troponin 136 result given to Dr. Arnoldo Morale

## 2023-07-30 NOTE — Sepsis Progress Note (Signed)
Sepsis protocol monitored by eLink ?

## 2023-07-30 NOTE — Plan of Care (Signed)
  Problem: Education: Goal: Knowledge of General Education information will improve Description: Including pain rating scale, medication(s)/side effects and non-pharmacologic comfort measures Outcome: Progressing   Problem: Clinical Measurements: Goal: Ability to maintain clinical measurements within normal limits will improve Outcome: Progressing Goal: Will remain free from infection Outcome: Progressing   

## 2023-07-31 ENCOUNTER — Other Ambulatory Visit: Payer: Self-pay | Admitting: Nurse Practitioner

## 2023-07-31 DIAGNOSIS — J189 Pneumonia, unspecified organism: Secondary | ICD-10-CM | POA: Diagnosis not present

## 2023-07-31 DIAGNOSIS — Z7189 Other specified counseling: Secondary | ICD-10-CM | POA: Diagnosis not present

## 2023-07-31 DIAGNOSIS — J9621 Acute and chronic respiratory failure with hypoxia: Secondary | ICD-10-CM | POA: Diagnosis not present

## 2023-07-31 DIAGNOSIS — R7989 Other specified abnormal findings of blood chemistry: Secondary | ICD-10-CM | POA: Diagnosis not present

## 2023-07-31 LAB — HEPARIN LEVEL (UNFRACTIONATED)
Heparin Unfractionated: 0.11 IU/mL — ABNORMAL LOW (ref 0.30–0.70)
Heparin Unfractionated: 0.32 IU/mL (ref 0.30–0.70)

## 2023-07-31 MED ORDER — LAMOTRIGINE 25 MG PO TABS: 25.0000 mg | ORAL_TABLET | Freq: Every day | ORAL | Status: DC

## 2023-07-31 MED ORDER — HEPARIN BOLUS VIA INFUSION
2400.0000 [IU] | Freq: Once | INTRAVENOUS | Status: AC
  Administered 2023-07-31: 2400 [IU] via INTRAVENOUS
  Filled 2023-07-31: qty 2400

## 2023-07-31 MED ORDER — PANTOPRAZOLE SODIUM 40 MG PO TBEC
40.0000 mg | DELAYED_RELEASE_TABLET | Freq: Every day | ORAL | Status: DC
  Administered 2023-07-31 – 2023-08-02 (×3): 40 mg via ORAL
  Filled 2023-07-31 (×3): qty 1

## 2023-07-31 MED ORDER — FLUDROCORTISONE ACETATE 0.1 MG PO TABS
0.1000 mg | ORAL_TABLET | Freq: Every day | ORAL | Status: DC
  Administered 2023-07-31 – 2023-08-02 (×3): 0.1 mg via ORAL
  Filled 2023-07-31 (×3): qty 1

## 2023-07-31 MED ORDER — CYCLOBENZAPRINE HCL 10 MG PO TABS
10.0000 mg | ORAL_TABLET | Freq: Every day | ORAL | Status: DC
  Administered 2023-07-31 – 2023-08-01 (×2): 10 mg via ORAL
  Filled 2023-07-31 (×2): qty 1

## 2023-07-31 MED ORDER — PREGABALIN 75 MG PO CAPS
225.0000 mg | ORAL_CAPSULE | Freq: Two times a day (BID) | ORAL | Status: DC
  Administered 2023-07-31 – 2023-08-02 (×5): 225 mg via ORAL
  Filled 2023-07-31 (×5): qty 3

## 2023-07-31 MED ORDER — LAMOTRIGINE 25 MG PO TABS
225.0000 mg | ORAL_TABLET | Freq: Every day | ORAL | Status: DC
  Administered 2023-07-31 – 2023-08-01 (×2): 225 mg via ORAL
  Filled 2023-07-31 (×2): qty 1

## 2023-07-31 MED ORDER — AMITRIPTYLINE HCL 10 MG PO TABS
10.0000 mg | ORAL_TABLET | Freq: Every day | ORAL | Status: DC
  Administered 2023-07-31 – 2023-08-01 (×2): 10 mg via ORAL
  Filled 2023-07-31 (×2): qty 1

## 2023-07-31 MED ORDER — OXYCODONE HCL 5 MG PO TABS: 15.0000 mg | ORAL_TABLET | Freq: Two times a day (BID) | ORAL | Status: DC | PRN

## 2023-07-31 MED ORDER — NEPRO/CARBSTEADY PO LIQD
237.0000 mL | Freq: Three times a day (TID) | ORAL | Status: DC
  Administered 2023-07-31 – 2023-08-02 (×6): 237 mL via ORAL

## 2023-07-31 MED ORDER — FERROUS SULFATE 325 (65 FE) MG PO TABS
325.0000 mg | ORAL_TABLET | Freq: Two times a day (BID) | ORAL | Status: DC
  Administered 2023-07-31 – 2023-08-02 (×4): 325 mg via ORAL
  Filled 2023-07-31 (×4): qty 1

## 2023-07-31 MED ORDER — TOPIRAMATE 25 MG PO TABS
25.0000 mg | ORAL_TABLET | Freq: Every day | ORAL | Status: DC
  Administered 2023-07-31 – 2023-08-01 (×2): 25 mg via ORAL
  Filled 2023-07-31 (×2): qty 1

## 2023-07-31 MED ORDER — OXYCODONE HCL 5 MG PO TABS
15.0000 mg | ORAL_TABLET | Freq: Three times a day (TID) | ORAL | Status: DC | PRN
  Administered 2023-08-01 – 2023-08-02 (×3): 15 mg via ORAL
  Filled 2023-07-31 (×3): qty 3

## 2023-07-31 MED ORDER — CLONAZEPAM 0.25 MG PO TBDP
0.2500 mg | ORAL_TABLET | Freq: Two times a day (BID) | ORAL | Status: DC
  Administered 2023-07-31 – 2023-08-02 (×5): 0.25 mg via ORAL
  Filled 2023-07-31 (×4): qty 1
  Filled 2023-07-31: qty 2

## 2023-07-31 MED ORDER — ALBUTEROL SULFATE (2.5 MG/3ML) 0.083% IN NEBU: 2.5000 mg | INHALATION_SOLUTION | RESPIRATORY_TRACT | Status: DC | PRN

## 2023-07-31 MED ORDER — ICOSAPENT ETHYL 1 G PO CAPS
2.0000 g | ORAL_CAPSULE | Freq: Two times a day (BID) | ORAL | Status: DC
  Administered 2023-07-31 – 2023-08-02 (×5): 2 g via ORAL
  Filled 2023-07-31 (×5): qty 2

## 2023-07-31 MED ORDER — METOPROLOL SUCCINATE ER 25 MG PO TB24
25.0000 mg | ORAL_TABLET | Freq: Every day | ORAL | Status: DC
  Administered 2023-07-31 – 2023-08-02 (×3): 25 mg via ORAL
  Filled 2023-07-31 (×3): qty 1

## 2023-07-31 MED ORDER — CHLORPROMAZINE HCL 25 MG PO TABS
75.0000 mg | ORAL_TABLET | Freq: Every day | ORAL | Status: DC
  Administered 2023-07-31 – 2023-08-01 (×2): 75 mg via ORAL
  Filled 2023-07-31 (×2): qty 3

## 2023-07-31 MED ORDER — ESCITALOPRAM OXALATE 10 MG PO TABS
20.0000 mg | ORAL_TABLET | Freq: Every day | ORAL | Status: DC
  Administered 2023-07-31 – 2023-08-02 (×3): 20 mg via ORAL
  Filled 2023-07-31: qty 2
  Filled 2023-07-31: qty 1
  Filled 2023-07-31: qty 2

## 2023-07-31 NOTE — Consult Note (Addendum)
Consultation Note Date: 07/31/2023   Patient Name: Erika [name]  DOB: 03/28/1971  MRN: [id number]  Age / Sex: 52 y.o., female  PCP: Eden Emms, NP Referring Physician: Sunnie Nielsen, DO  Reason for Consultation: Establishing goals of care  HPI/Patient Profile:  Per EMR, Brief Pt Description / Synopsis:  Erika [name] is 52 yo female who has a PMHx of chronic respiratory failure on 4L home O2 via [place], HTN, HLD, CABG & Endarterectomy s/p MI (2017), CVA w/ left-sided deficit, obesity, OSA, PAD, PNA, PTSD, Reflux gastritis (06/2023), T2DM who presents to [facility] ED for SOB and AMS while being treated for bilat PNA with Augmentin and Prednisone taper following discharge from [facility] on 07/26.   History of Present Illness:  Erika [name] is 52 yo female who has a PMHx of chronic respiratory failure on 4L home O2 via [place], HTN, HLD, CABG & Endarterectomy s/p MI (2017), CVA w/ left-sided deficit, obesity, OSA, PAD, PNA, PTSD, Reflux gastritis (06/2023), T2DM who presents to [facility] ED for SOB and AMS while being treated for bilat PNA with Augmentin and Prednisone taper following discharge from [facility] on 07/26. In the ED, patient was started on vancomycin and cefepime for empiric HAP coverage. Patient began getting hypotensive and, despite 1L of IVF, patient was started on Levophed.   On interview, patient was A&Ox4 in bed on 6L O2 via [place] with her husband, a former paramedic, in the room. Per patient's husband, patient was found at home by him with her BiPAP off and her oxygen level was about 30%. Patient's husband increased her machine to 10L and the "highest I could get it [patient's pulse ox] is was about 67%". Patient's husband also noted her mental status was poor and did not improve with oxygen at this time. Patient is currently full code, but she was previously DNR/DNI changed due to organ donor status for  family member. At home, patient is normally on 4L home O2 via [place] and Bipap at night. Patient normally walks with a hemi walker due to left-sided deficits s/p CVA.  Clinical Assessment and Goals of Care: Notes and labs reviewed.  In to see patient.  Her husband is at bedside.  She states they have a special needs daughter who lives with them.   We discussed her diagnoses, prognosis, GOC, EOL wishes disposition and options.  Created space and opportunity for patient  to explore thoughts and feelings regarding current medical information.   A detailed discussion was had today regarding advanced directives.  Concepts specific to code status, artifical feeding and hydration, IV antibiotics and rehospitalization were discussed.  The difference between an aggressive medical intervention path and a comfort care path was discussed.  Values and goals of care important to patient and family were attempted to be elicited.  Discussed limitations of medical interventions to prolong quality of life in some situations and discussed the concept of human mortality.  Patient discusses all of her health conditions and updates.  States she used to be a DNR/DNI, but  changed to full code full scope as she wanted to have the possibility of being an organ donor for her daughter who is in renal failure.  With more in-depth conversation, patient states she transitioned to full code/ full scope, as she had been advised that if she was a DNR, she would not be placed on a ventilator or have other care that she would want in an effort to treat the treatable.  Discussed that DNR and DNI or 2 separate decisions.  Discussed the DNR simply means DO NOT RESUSCITATE, it does not mean do not treat.   With additional conversation she states she would really never want to be put on the ventilator for any reason.  She states that she would like all care indicated except ventilator support, and would not want CPR in cardiopulmonary  arrest.  She discusses her daughter and fears over leaving her husband and daughter.  She states the family is a family of faith and so she understands God control of everything.  I reviewed each option and completed a MOST form today and the signed original was placed in the chart. The form was scanned and sent to medical records for it to be uploaded under ACP tab in Epic. A photocopy was also placed in the chart to be scanned into EMR. The patient outlined their wishes for the following treatment decisions:  Cardiopulmonary Resuscitation: Do Not Attempt Resuscitation (DNR/No CPR)  Medical Interventions: Limited Additional Interventions: Use medical treatment, IV fluids and cardiac monitoring as indicated, DO NOT USE intubation or mechanical ventilation. May consider use of less invasive airway support such as BiPAP or CPAP. Also provide comfort measures. Transfer to the hospital if indicated. Avoid intensive care.   Antibiotics: Antibiotics if indicated  IV Fluids: IV fluids if indicated  Feeding Tube: No feeding tube       SUMMARY OF RECOMMENDATIONS   DNR/DNI-treat the treatable.    Prognosis:  Poor overall      Primary Diagnoses: Present on Admission:  Respiratory failure (HCC)  HCAP (healthcare-associated pneumonia)  SOB (shortness of breath)  Elevated troponin   I have reviewed the medical record, interviewed the patient and family, and examined the patient. The following aspects are pertinent.  Past Medical History:  Diagnosis Date   Acute respiratory failure with hypoxia (HCC) 06/24/2021   Arthralgia of temporomandibular joint    CAD, multiple vessel    a. 06/2016 Cath: ostLM 40%, ostLAD 40%, pLAD 95%, ost-pLCx 60%, pLCx 95%, mLCx 60%, mRCA 95%, D2 50%, LVSF nl;  b. 07/2016 CABG x 4 (LIMA->LAD, VG->Diag, VG->OM, VG->RCA); c. 08/2016 Cath: 3VD w/ 4/4 patent grafts. LAD distal to LIMA has diff dzs->Med rx; d. 08/2020 Cath: 4/4 patent grafts, native 3VD. EF 55-65%-->Med Rx.    Carotid arterial disease (HCC)    a. 07/2016 s/p R CEA; b. 02/2021 U/S: RICA 40-59%, LICA 1-39%.   Cerebrovascular disease    Clotting disorder (HCC)    Complex sleep apnea syndrome 10/03/2022   Depression    Diastolic dysfunction    a. 06/2016 Echo: EF 50-55%, mild inf wall HK, GR1DD, mild MR, RV sys fxn nl, mildly dilated LA, PASP nl; b. 06/2021 Echo: EF 60-65%, no rwma. Nl RV fxn.   Essential hypertension    Essential hypertension, malignant 06/28/2016   Fatty liver disease, nonalcoholic 2016   History of blood transfusion    with heart surgery   History of MI (myocardial infarction) (July 2017) 08/02/2020   HLD (hyperlipidemia)  HTN (hypertension), malignant 10/20/2013   Labile hypertension    a. prior renal ngiogram negative for RAS in 03/2016; b. catecholamines and metanephrines normal, mildly elevated renin with normal aldosterone and normal ratio in 02/2016   Mild tobacco abuse in early remission 06/28/2016   Myocardial infarction [facility]) 2017   Obesity    PAD (peripheral artery disease) (HCC)    a. 09/2018 s/p L SFA stenting; b. 07/2019 Periph Angio: Patent m/d L SFA stent w/ 100% L SFA distal to stent. L AT 100d, L Peroneal diff dzs-->Med Rx; c. 02/2021 ABIs: stable @ 0.61 on R and 0.46 on L.   PTSD (post-traumatic stress disorder)    Reflux gastritis 07/01/2023   Stroke (HCC)    Tobacco abuse    Type 2 diabetes mellitus (HCC) 12/2015   Social History   Socioeconomic History   Marital status: Married    Spouse name: Not on file   Number of children: 1   Years of education: 14   Highest education level: Not on file  Occupational History   Occupation: Disability  Tobacco Use   Smoking status: Former    Current packs/day: 0.00    Average packs/day: 0.5 packs/day for 30.0 years (15.0 ttl pk-yrs)    Types: Cigarettes    Start date: 20    Quit date: 2022    Years since quitting: 2.6    Passive exposure: Never   Smokeless tobacco: Never  Vaping Use   Vaping status:  Every Day   Substances: Flavoring  Substance and Sexual Activity   Alcohol use: Not Currently    Alcohol/week: 0.0 standard drinks of alcohol    Comment: socially   Drug use: No   Sexual activity: Yes    Partners: Male    Birth control/protection: None  Other Topics Concern   Not on file  Social History Narrative   11/21/20   From: [name]   Living: with husband, [name] (2009) and special needs daughter    Work: disability       Family: daughter - [name] (special needs, lives with her) and 2 grown step children - [name] and [name] Client       Enjoys: play with dogs - 2 german Shepard, mut, and blue tick walker mix      Exercise: PAD limits exercise   Diet: diabetic diet and low potassium due to daughter      Safety   Seat belts: Yes    Guns: Yes  and secure   Safe in relationships: Yes    Social Determinants of Health   Financial Resource Strain: Not on file  Food Insecurity: No Food Insecurity (07/29/2023)   Hunger Vital Sign    Worried About Running Out of Food in the Last Year: Never true    Ran Out of Food in the Last Year: Never true  Transportation Needs: No Transportation Needs (07/29/2023)   PRAPARE - Administrator, Civil Service (Medical): No    Lack of Transportation (Non-Medical): No  Physical Activity: Not on file  Stress: Not on file  Social Connections: Not on file   Family History  Adopted: Yes  Problem Relation Age of Onset   Diabetes Mother    Diabetes Father    Alcohol abuse Father    Heart disease Father    Drug abuse Father    Stroke Sister    Anxiety disorder Sister    Scheduled Meds:  amitriptyline  10 mg Oral QHS   arformoterol  15 mcg Nebulization BID   aspirin EC  81 mg Oral Daily   budesonide (PULMICORT) nebulizer solution  0.5 mg Nebulization BID   Chlorhexidine Gluconate Cloth  6 each Topical Daily   chlorproMAZINE  75 mg Oral QHS   clonazepam  0.25 mg Oral BID   cyclobenzaprine  10 mg Oral QHS   escitalopram   20 mg Oral Daily   feeding supplement (NEPRO CARB STEADY)  237 mL Oral TID BM   ferrous sulfate  325 mg Oral BID WC   fludrocortisone  0.1 mg Oral Daily   icosapent Ethyl  2 g Oral BID   lamoTRIgine  225 mg Oral QHS   methylPREDNISolone (SOLU-MEDROL) injection  80 mg Intravenous Daily   metoprolol succinate  25 mg Oral Daily   mouth rinse  15 mL Mouth Rinse 4 times per day   pantoprazole  40 mg Oral Daily   pregabalin  225 mg Oral BID   revefenacin  175 mcg Nebulization Daily   topiramate  25 mg Oral QHS   Continuous Infusions:  ceFEPime (MAXIPIME) IV 200 mL/hr at 07/31/23 1500   heparin 1,500 Units/hr (07/31/23 1500)   PRN Meds:.acetaminophen, albuterol, lip balm, mouth rinse, oxyCODONE Medications Prior to Admission:  Prior to Admission medications   Medication Sig Start Date End Date Taking? Authorizing Provider  acetaminophen (TYLENOL) 325 MG tablet Take 1-2 tablets (325-650 mg total) by mouth every 4 (four) hours as needed for mild pain. 08/01/21  Yes Love, Evlyn Kanner, PA-C  albuterol (PROVENTIL) (2.5 MG/3ML) 0.083% nebulizer solution Take 3 mLs (2.5 mg total) by nebulization every 4 (four) hours as needed for wheezing or shortness of breath. 07/05/23 08/04/23 Yes Enedina Finner, MD  amitriptyline (ELAVIL) 10 MG tablet Take 1 tablet (10 mg total) by mouth at bedtime. 07/23/23  Yes Raulkar, Drema Pry, MD  chlorproMAZINE (THORAZINE) 25 MG tablet Take 3 tablets (75 mg total) by mouth at bedtime. 07/08/23  Yes Arfeen, Phillips Grout, MD  clonazePAM (KLONOPIN) 0.5 MG tablet Take 0.5 tablets (0.25 mg total) by mouth 2 (two) times daily. Take 1/2 tab twice daily. No dissolving tablets. 07/08/23  Yes Arfeen, Phillips Grout, MD  clopidogrel (PLAVIX) 75 MG tablet Take 1 tablet (75 mg total) by mouth daily. 03/06/23  Yes Sater, Pearletha Furl, MD  cyclobenzaprine (FLEXERIL) 10 MG tablet TAKE ONE TABLET BY MOUTH AT BEDTIME 06/26/23  Yes Raulkar, Drema Pry, MD  escitalopram (LEXAPRO) 20 MG tablet Take 1 tablet (20 mg total) by  mouth daily. 07/08/23 10/06/23 Yes Arfeen, Phillips Grout, MD  ferrous sulfate 324 (65 Fe) MG TBEC Take 1 tablet (324 mg total) by mouth 2 (two) times daily. 07/16/23  Yes Iran Ouch, MD  fludrocortisone (FLORINEF) 0.1 MG tablet Take 1 tablet (0.1 mg total) by mouth daily. 07/16/23  Yes Iran Ouch, MD  icosapent Ethyl (VASCEPA) 1 g capsule Take 2 g by mouth 2 (two) times daily.   Yes [provider]  lamoTRIgine (LAMICTAL) 200 MG tablet Take 1 tablet (200 mg total) by mouth at bedtime. 07/08/23  Yes Arfeen, Phillips Grout, MD  lamoTRIgine (LAMICTAL) 25 MG tablet Take 1 tablet (25 mg total) by mouth daily. Patient taking differently: Take 25 mg by mouth at bedtime. 07/08/23 10/06/23 Yes Arfeen, Phillips Grout, MD  metoprolol succinate (TOPROL-XL) 25 MG 24 hr tablet Take 1 tablet (25 mg total) by mouth daily. 07/16/23  Yes Iran Ouch, MD  nitroGLYCERIN (NITROSTAT) 0.4 MG SL tablet Place 1 tablet (0.4 mg  total) under the tongue every 5 (five) minutes as needed for chest pain. 03/15/21  Yes Iran Ouch, MD  oxyCODONE (ROXICODONE) 15 MG immediate release tablet Take 1 tablet (15 mg total) by mouth 2 (two) times daily as needed for pain. 07/23/23  Yes Raulkar, Drema Pry, MD  pantoprazole (PROTONIX) 40 MG tablet TAKE ONE TABLET BY MOUTH ONE TIME DAILY 07/24/23  Yes Raulkar, Drema Pry, MD  pregabalin (LYRICA) 225 MG capsule Take 1 capsule (225 mg total) by mouth 2 (two) times daily. 04/11/23  Yes Jones Bales, NP  Tiotropium Bromide-Olodaterol (STIOLTO RESPIMAT) 2.5-2.5 MCG/ACT AERS Inhale 2 puffs into the lungs daily. 06/24/23  Yes Glenford Bayley, NP  topiramate (TOPAMAX) 25 MG tablet Take 1 tablet (25 mg total) by mouth at bedtime. 05/16/23  Yes Raulkar, Drema Pry, MD  Continuous Glucose Sensor (DEXCOM G7 SENSOR) MISC APPLY ONE SENSOR TO THE BACK OF YOUR UPPER ARM. REPLACE EVERY 10 DAYS. 04/12/23   Eden Emms, NP  Evolocumab (REPATHA SURECLICK) 140 MG/ML SOAJ Inject 140 mg into the skin as  directed. Inject 1 Dose into the skin every 14 (fourteen) days. 10/31/22   Furth, Cadence H, PA-C  MOUNJARO 7.5 MG/0.5ML Pen INJECT THE CONTENTS OF ONE PEN UNDER THE SKIN WEEKLY ON THE SAME DAY EACH WEEK 06/21/23   Eden Emms, NP  Stann Ore MINI PEN NEEDLES 31G X 6 MM MISC USE PEN NEEDLES TWO TIMES DAILY 12/26/22   Eden Emms, NP   No Known Allergies Review of symptoms: negative for complaint  Physical Exam Pulmonary:     Effort: Pulmonary effort is normal.  Neurological:     Mental Status: She is alert.     Vital Signs: BP (!) 142/59   Pulse 74   Temp 98.4 F (36.9 C) (Oral)   Resp 17   Ht 5\' 7"  (1.702 m)   Wt 88.3 kg   SpO2 100%   BMI 30.49 kg/m  Pain Scale: 0-10 POSS *See Group Information*: 1-Acceptable,Awake and alert Pain Score: 3    SpO2: SpO2: 100 % O2 Device:SpO2: 100 % O2 Flow Rate: .O2 Flow Rate (L/min): 5 L/min  IO: Intake/output summary:  Intake/Output Summary (Last 24 hours) at 07/31/2023 1559 Last data filed at 07/31/2023 1500 Gross per 24 hour  Intake 2243.48 ml  Output 2325 ml  Net -81.52 ml    LBM: Last BM Date :  (PTA) Baseline Weight: Weight: 88.3 kg Most recent weight: Weight: 88.3 kg       Signed by: Morton Stall, NP   Please contact Palliative Medicine Team phone at [phone] for questions and concerns.  For individual provider: See Loretha Stapler

## 2023-07-31 NOTE — Hospital Course (Addendum)
Erika [name] is 52 yo female who has a PMHx of chronic respiratory failure on 4L home O2 via [place], HTN, HLD, CABG & Endarterectomy s/p MI (2017), CVA w/ left-sided deficit, obesity, OSA, PAD, PNA, PTSD, Reflux gastritis (06/2023), T2DM who presents to [facility] ED for SOB and AMS while being treated for bilat PNA with Augmentin and Prednisone taper following discharge from [facility] on 07/26. Per patient's husband (former paramedic) he found her at home w/ BiPap and O2 off and SpO2 30%, he increased O2 to 10L but SpO2 only improved to 67% and noted AMS. At home, patient is normally on 4L home O2 via [place] and Bipap at night. Patient normally walks with a hemi walker due to left-sided deficits s/p CVA.  08/06: to ED. Vanc + cefepime for HAP coverage, hypotension requiring pressors, to ICU. CT chest "Multi lobar and dependent bilateral pneumonia, progressed since 07/16/2023 CTA. Consider sequelae of aspiration in this clinical setting." Elevated troponin, started heparin gtt, cardiology consult  08/07: hospitalist assumes care. Continue heparin x48h total then can restart plavix and d/c ASA.  08/08: palliative care s/o, consider hospice depending on pt decision re: thin vs thick liquids, follow outpatient. Cardiology keep plan to manage conservatively, resume clopidogrel tomorrow.  08/09: off heparin, on baseline O2 and no SOB, pt feeling well, stable for discharge. Resume HH PT/OT/RN/RT  Consultants:  Cardiology   Procedures: none      ASSESSMENT & PLAN:   Principal Problem:   Respiratory failure (HCC) Active Problems:   SOB (shortness of breath)   HCAP (healthcare-associated pneumonia)  Acute on chronic Respiratory Failure  likely d/t aspiration pneumonia vs aspiration pneumonitis  w/ associated Severe Sepsis d/t pneumonia vs Severe SIRS d/t pneumonitis  Neg COVID, neg MRSA screen Supplemental O2  Bronchodilators Pulmonary toilet as able Chest PT vest Rx for home   Septic Shock, Source:  Aspiration PNA vs ?HAP Vs shock d/t respiratory/cardiac compromise from SIRS/resp failure as above Required pressors, now off  BCx negative, MRSA negative, COVID negative   HFpEF Echo (07/17/23): LVEF 60-65%. Mild LV hypertrophy. LV diastolic parameters consistent w/ Grade I diastolic dysfunction. Diuresis as BP and renal function permits  Elevated troponin -NSTEMI Type II MI d/t likely demand ischemia but progression of her severe underlying CAD cannot be entirely excluded  136->450->991->918.  Cardiology following S/p IV heparin x48h total Restart plavix and ASA  Follow outpatient   Chronic cortical encephalomalacia likely s/p previous CVA Acute Metabolic Encephalopathy d/t sepsis, septic shock, NSTEMI - acute encephalopathy has resolved  CT Head wo Contrast: No acute intracranial abn. Chronic cortical encephalomalacia in right parietal and occipital lobes, and moderate brainstem atrophy and/or Wallerian degeneration - greater on the right Patient's left-sided deficits are chronic Continue monitor mental status - has improved  Treatment of metabolic derangements as outlined above Supportive care   T2DM Monitor fasting Glc   Renal Monitor I&O's / urinary output Follow BMP Ensure adequate renal perfusion Avoid nephrotoxic agents as able Replace electrolytes as indicated ~ Pharmacy following for assistance with electrolyte replacement      DVT prophylaxis: heparin gtt per ACS protocol  Pertinent IV fluids/nutrition: d/c IV fluids  Central lines / invasive devices: none  Code Status: DNR/DNI per palliative care discussion.   ACP documentation reviewed: 07/31/23 advanced directive on file from 2021  Current Admission Status: inpatient  TOC needs / Dispo plan: anticipate d/c home, confrm home health / DME Barriers to discharge / significant pending items: clinical improvement, off heparin gtt this  evening, possible d/c tomorrow

## 2023-07-31 NOTE — Consult Note (Signed)
ANTICOAGULATION CONSULT NOTE   Pharmacy Consult for Heparin Infusion Indication: chest pain/ACS  Patient Measurements: Height: 5\' 7"  (170.2 cm) Weight: 88.3 kg (194 lb 10.7 oz) IBW/kg (Calculated) : 61.6 Heparin Dosing Weight: 80.4 kg  Labs: Recent Labs    07/30/23 0838 07/30/23 0912 07/30/23 1110 07/30/23 1130 07/30/23 1711 07/30/23 1821 07/31/23 0304 07/31/23 1002  HGB 9.1*  --   --   --   --   --  8.5*  --   HCT 31.2*  --   --   --   --   --  28.2*  --   PLT 374  --   --   --   --   --  368  --   APTT  --  SPECIMEN CLOTTED 41*  --   --   --   --   --   LABPROT  --  SPECIMEN CLOTTED 16.0*  --   --   --   --   --   INR  --  SPECIMEN CLOTTED 1.3*  --   --   --   --   --   HEPARINUNFRC  --   --   --   --   --   --  0.11* 0.11*  CREATININE 0.98 0.88  --   --   --   --   --   --   TROPONINIHS 136*  --   --  450* 991* 918*  --   --     Estimated Creatinine Clearance: 88.3 mL/min (by C-G formula based on SCr of 0.88 mg/dL).   Medical History: Past Medical History:  Diagnosis Date   Acute respiratory failure with hypoxia (HCC) 06/24/2021   Arthralgia of temporomandibular joint    CAD, multiple vessel    a. 06/2016 Cath: ostLM 40%, ostLAD 40%, pLAD 95%, ost-pLCx 60%, pLCx 95%, mLCx 60%, mRCA 95%, D2 50%, LVSF nl;  b. 07/2016 CABG x 4 (LIMA->LAD, VG->Diag, VG->OM, VG->RCA); c. 08/2016 Cath: 3VD w/ 4/4 patent grafts. LAD distal to LIMA has diff dzs->Med rx; d. 08/2020 Cath: 4/4 patent grafts, native 3VD. EF 55-65%-->Med Rx.   Carotid arterial disease (HCC)    a. 07/2016 s/p R CEA; b. 02/2021 U/S: RICA 40-59%, LICA 1-39%.   Cerebrovascular disease    Clotting disorder (HCC)    Complex sleep apnea syndrome 10/03/2022   Depression    Diastolic dysfunction    a. 06/2016 Echo: EF 50-55%, mild inf wall HK, GR1DD, mild MR, RV sys fxn nl, mildly dilated LA, PASP nl; b. 06/2021 Echo: EF 60-65%, no rwma. Nl RV fxn.   Essential hypertension    Essential hypertension, malignant 06/28/2016    Fatty liver disease, nonalcoholic 2016   History of blood transfusion    with heart surgery   History of MI (myocardial infarction) (July 2017) 08/02/2020   HLD (hyperlipidemia)    HTN (hypertension), malignant 10/20/2013   Labile hypertension    a. prior renal ngiogram negative for RAS in 03/2016; b. catecholamines and metanephrines normal, mildly elevated renin with normal aldosterone and normal ratio in 02/2016   Mild tobacco abuse in early remission 06/28/2016   Myocardial infarction [facility]) 2017   Obesity    PAD (peripheral artery disease) (HCC)    a. 09/2018 s/p L SFA stenting; b. 07/2019 Periph Angio: Patent m/d L SFA stent w/ 100% L SFA distal to stent. L AT 100d, L Peroneal diff dzs-->Med Rx; c. 02/2021 ABIs: stable @ 0.61 on R and 0.46  on L.   PTSD (post-traumatic stress disorder)    Reflux gastritis 07/01/2023   Stroke (HCC)    Tobacco abuse    Type 2 diabetes mellitus (HCC) 12/2015    Medications:  No AC prior to admission. Last dose of Plavix 07/29/2023  Assessment: Patient with hx of CAD s/p CABG in setting of NSTEMI, PAD, labile hypertension, chronic HFpEF, mixed hyperlipidemia, and type 2 diabetes mellitus. Admitted with SOB and elevated troponin. Pharmacy consulted to manage heparin infusion for ACS.  Baseline PLT WNL. H?H low but improved in last 2 weeks. No S/Sx of bleeding noted per chart review  Goal of Therapy:  Heparin level 0.3-0.7 units/ml Monitor platelets by anticoagulation protocol: Yes  0807 0304 HL 0.11, subtherapeutic; 1000 un/hr 0807 1002 HL 0.11, subtherapeutic; 1250 un/hr  Plan:  Bolus 2400 units x 1 Increase heparin infusion to 1500 units/hr Will recheck HL in 6 hr after rate change CBC daily while on heparin  Tressie Ellis 07/31/2023 10:47 AM

## 2023-07-31 NOTE — Progress Notes (Signed)
Speech Language Pathology Treatment: Dysphagia  Patient Details Name: DAYRA WROBLEWSKI MRN: [id number] DOB: [birth year] Today's Date: 07/31/2023 Time: 8295-6213 SLP Time Calculation (min) (ACUTE ONLY): 45 min  Assessment / Plan / Recommendation Clinical Impression  Met w/ both pt and Husband in room this morning. Provided education on recommendations from both BSE yesterday and the MBSS results/recommendations from recent [facility] 7/12/12024 indicating needs for transition to Nectar consistency liquids. Pt A/Ox3(has dx'd Mild Vascular Dementia per chart, Husband).  Pt on RA; afebrile. WBC elevated but trending down. Noted Chest Imaging this admit.     Per recent MBSS, 07/05/2023: "Patient presents with moderate and chronic pharyngeal phase dysphagia w/ mild oral phase dysphagia. Similar was noted per MBSS 06/2021 s/p R CVA, w/ resulting L sided weakness.  Oral stage is characterized by inconsistnet lip closure w/ escape of thin liquids x2 and min increased bolus preparation and mastication time w/ solids. Bolus containment and anterior to posterior transit time was functional. Swallow initiation occurs at the level of the pyriform sinuses for thin liquids, spilling to the p.s. for nectar liquids, and the valleculae for remaining consistencies.  Pharyngeal stage is noted for functional tongue base retraction, adequate hyolaryngeal excursion, and adequate pharyngeal constriction. Epiglottic deflection is complete during timely pharyngeal swallows. HOWEVER, w/ thin liquid consistencies and the delay in pharyngeal swallow initiation, laryngeal penetration and aspiration(x1 trial) occurs d/t the decreased pharyngeal sensation and reduced timing of epiglottic inversion. This results in laryngeal penetration(to the cords x1) and aspiration(x1) w/ thins; coating along underneath side of the epiglottis w/ nectar liquids. Pt exhibited delayed sensation to the aspiration; no apparent sensation to the laryngeal  penetration occurring. Instructed pt on using a throat/re-swallow to aid clearing of vestibule. No overt pharyngeal residue noted; pharyngeal stripping wave is complete. Amplitude/duration of cricopharyngeus opening is WFL. There is adequate/complete clearance through the Cervical esophagus.".   Answered any questions re: the recommendations moving forward to include Nectar liquids in pt's diet; BOTH pt and Husband agreed. Pt has been drinking some Nectar consistency liquids at home since last admit/discharge w/out reported overt s/s of aspiration. Husband and pt endorsed she was only drinking a "few"; she stated she did not like many of the pre-thickened Nectar liquids that have been/are being provided to her to drink -- any decreased liquid intake could increase risk for dehydration which could impact overall health. Encouraged f/u w/ MD re: this. Pt and husband agreed.  Pt consumed trials of soft foods from breakfast meal w/out difficulty reported by pt/NSG; pt consumed trials of Nectar liquids and Pills WHOLE in Puree w/ no overt clinical s/s of aspiration noted; oral phase functional for bolus management and oral clearing. Pt followed general aspiration precautions w/ cues as reminders(f/u dry swallow, cup drinking only, small bites/sips slowly).  Education provided on the Nectar consistency liquids including: prep and ordering of Nectar liquids for home use; pt has thickener/pre-thickened Nectar consistency liquids at home; discussed other liquids and their comparison to a pre-thickened liquid when mixing/drinking; aspiration precautions; suggestions for managing liquids in foods(mixed consistencies) and food prep(thickened cream soups); handouts/supplies.  Discussed pt's views on having to drink thickened liquids in order to reduce risk for aspiration -- highlighted to pt and Husband that the thickened liquids will NOT prevent aspiration but can reduce risk of such; both agreed. Discussed importance of  oral care prior to/after meals also.    Pt to follow up w/ PCP if any negative sequelae from suspected aspiration post D/C home. Recommend f/u  w/ Dietician and Palliative Care for support w/ GOC moving forward. No further skilled ST services indicated as pt appears at her Baseline w/ swallowing function. This was explained to both pt/Husband and MD/NSG. Pt/Husband had no further questions. MD/NSG updated, agreed.    HPI HPI: Per chart, pt is a 52 y.o. female with medical history significant of multiple medical issues including Chronic Dysphagia s/p stroke/MBSSs, chronic respiratory failure on 4-6L home O2, multivessel CAD status post CABG, carotid artery disease, R CVA with chronic left-sided deficits and Dysphagia, Mild vascular Dementia, primarily bedbound per chart, hypertension, peripheral artery disease status post stenting, type 2 diabetes, hyperlipidemia, HFpEF complex deep apnea syndrome, Obesity presenting from home with worsening altered mental status and hypoxia.  History is provided by the patient's husband who is at bedside.  Patient's husband found her with her CPAP off and significantly low oxygen level in the 70s.  Placed her on 6 L of oxygen and continued to be hypoxic.  Worsening altered mental status.  Patient had multiple recent hospitalizations for pneumonia.  Also has received steroids recently.  Patient was discharged on 26 July and 12 July prior to that for chronic respiratory failure attributed aspiration pneumonia and was treated.  Pt's hypotension has been suspected to be from autonomic dysfunction patient started Florinef by cardiology along with midodrine EKG changes for her syncopal episode and hypotension.  CT Imaging of the Chest: Multi-lobar and dependent bilateral pneumonia, progressed since  07/16/2023 CTA. Consider sequelae of aspiration in this clinical setting.  Pt also has Esophageal dysmotility issues per chart/Imaging.      SLP Plan  All goals met       Recommendations for follow up therapy are one component of a multi-disciplinary discharge planning process, led by the attending physician.  Recommendations may be updated based on patient status, additional functional criteria and insurance authorization.    Recommendations  Diet recommendations: Dysphagia 3 (mechanical soft);Nectar-thick liquid Liquids provided via: Cup;No straw Medication Administration: Whole meds with puree Supervision: Patient able to self feed;Intermittent supervision to cue for compensatory strategies (setup; monitoring as needed) Compensations: Minimize environmental distractions;Slow rate;Small sips/bites;Lingual sweep for clearance of pocketing;Follow solids with liquid;Multiple dry swallows after each bite/sip;Clear throat intermittently Postural Changes and/or Swallow Maneuvers: Out of bed for meals;Seated upright 90 degrees;Upright 30-60 min after meal (Reflux precs.)                 (Dietician f/u; Palliative Care f/u) Oral care BID;Oral care before and after PO;Patient independent with oral care (setup)   Intermittent Supervision/Assistance (Cognitive decline at baseline) Dysphagia, oropharyngeal phase (R13.12) (chronic Dysphagia; Cognitive decline baseline; Esophageal phase Dysmotility baseline)     All goals met       Jerilynn Som, MS, CCC-SLP Speech Language Pathologist Rehab Services; [facility] [place] [phone] (ascom) ,  07/31/2023, 12:23 PM

## 2023-07-31 NOTE — Progress Notes (Signed)
Rounding Note    Patient Name: Erika [name] Date of Encounter: 07/31/2023  [facility] Cardiologist: Lorine Bears, MD   Subjective   Patient is overall feeling better today. She denies chest pain . HS trop peak 991. She is requiring 5LO2.   Inpatient Medications    Scheduled Meds:  arformoterol  15 mcg Nebulization BID   aspirin EC  81 mg Oral Daily   budesonide (PULMICORT) nebulizer solution  0.5 mg Nebulization BID   Chlorhexidine Gluconate Cloth  6 each Topical Daily   methylPREDNISolone (SOLU-MEDROL) injection  80 mg Intravenous Daily   mouth rinse  15 mL Mouth Rinse 4 times per day   revefenacin  175 mcg Nebulization Daily   Continuous Infusions:  ceFEPime (MAXIPIME) IV Stopped (07/31/23 0541)   heparin 1,250 Units/hr (07/31/23 0600)   norepinephrine (LEVOPHED) Adult infusion Stopped (07/30/23 1725)   vancomycin Stopped (07/30/23 2316)   PRN Meds: acetaminophen, lip balm, mouth rinse   Vital Signs    Vitals:   07/31/23 0430 07/31/23 0500 07/31/23 0530 07/31/23 0600  BP: (!) 141/62 (!) 134/54 (!) 142/63 (!) 151/64  Pulse: 62 66 65 63  Resp: 18 17 12 15   Temp:      TempSrc:      SpO2: 100% 100% 100% 100%  Weight:      Height:        Intake/Output Summary (Last 24 hours) at 07/31/2023 0740 Last data filed at 07/31/2023 0600 Gross per 24 hour  Intake 3869.19 ml  Output 2325 ml  Net 1544.19 ml      07/30/2023    1:40 PM 07/26/2023    3:42 PM 07/23/2023    1:22 PM  Last 3 Weights  Weight (lbs) 194 lb 10.7 oz 197 lb 9.6 oz 196 lb  Weight (kg) 88.3 kg 89.631 kg 88.905 kg      Telemetry    NSR 60s - Personally Reviewed  ECG    No new - Personally Reviewed  Physical Exam   GEN: No acute distress.   Neck: No JVD Cardiac: RRR, no murmurs, rubs, or gallops.  Respiratory: Clear to auscultation bilaterally. GI: Soft, nontender, non-distended  MS: No edema; No deformity. Neuro:  Nonfocal  Psych: Normal affect   Labs    High  Sensitivity Troponin:   Recent Labs  Lab 07/17/23 0109 07/30/23 0838 07/30/23 1130 07/30/23 1711 07/30/23 1821  TROPONINIHS 9 136* 450* 991* 918*     Chemistry Recent Labs  Lab 07/26/23 1628 07/30/23 0838 07/30/23 0912  NA 139 135 136  K 4.0 4.1 4.4  CL 100 97* 96*  CO2 32 30 31  GLUCOSE 146* 128* 114*  BUN 10 12 12   CREATININE 0.74 0.98 0.88  CALCIUM 9.5 9.2 9.4  PROT 6.1  --  6.5  ALBUMIN  --   --  3.4*  AST 20  --  30  ALT 22  --  16  ALKPHOS  --   --  65  BILITOT 0.2  --  0.9  GFRNONAA  --  >60 >60  ANIONGAP  --  8 9    Lipids No results for input(s): "CHOL", "TRIG", "HDL", "LABVLDL", "LDLCALC", "CHOLHDL" in the last 168 hours.  Hematology Recent Labs  Lab 07/26/23 1628 07/30/23 0838 07/31/23 0304  WBC 9.6 21.1* 19.5*  RBC 4.41 4.19 3.90  HGB 9.5* 9.1* 8.5*  HCT 31.1* 31.2* 28.2*  MCV 70.5* 74.5* 72.3*  MCH 21.5* 21.7* 21.8*  MCHC 30.5* 29.2* 30.1  RDW 19.4* 23.1* 22.9*  PLT 421* 374 368   Thyroid No results for input(s): "TSH", "FREET4" in the last 168 hours.  BNP Recent Labs  Lab 07/30/23 0838  BNP 90.1    DDimer No results for input(s): "DDIMER" in the last 168 hours.   Radiology    CT Angio Chest Pulmonary Embolism (PE) W or WO Contrast  Result Date: 07/30/2023 CLINICAL DATA:  52 year old female with altered mental status, abnormal breathing, undergoing treatment for pneumonia. History of previous cerebral infarct with left side deficit. EXAM: CT ANGIOGRAPHY CHEST WITH CONTRAST TECHNIQUE: Multidetector CT imaging of the chest was performed using the standard protocol during bolus administration of intravenous contrast. Multiplanar CT image reconstructions and MIPs were obtained to evaluate the vascular anatomy. RADIATION DOSE REDUCTION: This exam was performed according to the departmental dose-optimization program which includes automated exposure control, adjustment of the mA and/or kV according to patient size and/or use of iterative  reconstruction technique. CONTRAST:  75mL OMNIPAQUE IOHEXOL 350 MG/ML SOLN COMPARISON:  Portable chest 0918 hours today.  Prior CTA 07/16/2023. Speech pathology evaluation 07/05/2023. FINDINGS: Cardiovascular: Good contrast bolus timing in the pulmonary arterial tree. No pulmonary artery filling defect. Prior CABG. Calcified aortic atherosclerosis. Cardiac size remains within normal limits. No pericardial effusion. Mediastinum/Nodes: Postoperative changes. Reactive appearing mediastinal lymph nodes including right paratracheal, similar to last month. No mediastinal mass or suspicious lymphadenopathy. Lungs/Pleura: Major airways are patent with some atelectatic changes. But there is bilateral lower lobe consolidation with air bronchograms, similar to but progressed since 07/16/2023. No pleural effusions. And there is similar dependent peribronchial opacity now in the middle lobes, additional patchy and nodular middle lobe bronchovascular involvement, and early mostly dependent peribronchial involvement also in the upper lobes. Upper Abdomen: Negative visible liver, spleen, pancreas, adrenal glands and bowel in the upper abdomen. Musculoskeletal: Sternotomy. No acute osseous abnormality identified. Review of the MIP images confirms the above findings. IMPRESSION: 1. Negative for acute pulmonary embolus. 2. Multi lobar and dependent bilateral pneumonia, progressed since 07/16/2023 CTA. Consider sequelae of aspiration in this clinical setting. 3.  Aortic Atherosclerosis (ICD10-I70.0).  Prior CABG. Electronically Signed   By: Odessa Fleming M.D.   On: 07/30/2023 11:13   CT Head Wo Contrast  Result Date: 07/30/2023 CLINICAL DATA:  52 year old female with altered mental status, abnormal breathing, undergoing treatment for pneumonia. History of previous cerebral infarct with left side deficit. EXAM: CT HEAD WITHOUT CONTRAST TECHNIQUE: Contiguous axial images were obtained from the base of the skull through the vertex without  intravenous contrast. RADIATION DOSE REDUCTION: This exam was performed according to the departmental dose-optimization program which includes automated exposure control, adjustment of the mA and/or kV according to patient size and/or use of iterative reconstruction technique. COMPARISON:  Brain MRI 07/16/2023.  Head CT 06/11/2023. FINDINGS: Brain: Right parietal and occipital lobe cortical encephalomalacia, gliosis appears stable. And there is chronic right brainstem atrophy, confirmed on the MRI last month. No superimposed No midline shift, ventriculomegaly, mass effect, evidence of mass lesion, intracranial hemorrhage or evidence of cortically based acute infarction. Gray-white matter differentiation is within normal limits throughout the brain. Vascular: No suspicious intracranial vascular hyperdensity. Calcified atherosclerosis at the skull base. Skull: Intact.  No acute osseous abnormality identified. Sinuses/Orbits: Visualized paranasal sinuses and mastoids are stable and well aerated. Other: Visualized orbits and scalp soft tissues are within normal limits. IMPRESSION: 1. No acute intracranial abnormality. 2. Chronic cortical encephalomalacia in the right parietal and occipital lobes, and moderate brainstem atrophy and/or Wallerian degeneration -  greater on the right. Electronically Signed   By: Odessa Fleming M.D.   On: 07/30/2023 10:59   DG Chest Port 1 View  Result Date: 07/30/2023 CLINICAL DATA:  Abnormal breathing EXAM: PORTABLE CHEST 1 VIEW COMPARISON:  CXR 07/16/23 FINDINGS: Status post median sternotomy and CABG. Unchanged cardiac and mediastinal contours. No large pleural effusion. No pneumothorax. There are prominent bilateral interstitial opacities there are somewhat basal predominant. Findings could represent pulmonary edema or multifocal infection. No radiographically apparent displaced rib fractures. Visualized upper abdomen is unremarkable. IMPRESSION: Prominent bilateral interstitial opacities,  which are somewhat basal predominant. Findings could represent pulmonary edema or multifocal infection. Electronically Signed   By: Lorenza Cambridge M.D.   On: 07/30/2023 09:29    Cardiac Studies   Echo bubble study 06/2023  1. Left ventricular ejection fraction, by estimation, is 60 to 65%. The  left ventricle has normal function. The left ventricle has no regional  wall motion abnormalities. There is mild left ventricular hypertrophy.  Left ventricular diastolic parameters  are consistent with Grade I diastolic dysfunction (impaired relaxation).   2. Right ventricular systolic function is normal. The right ventricular  size is normal. Tricuspid regurgitation signal is inadequate for assessing  PA pressure.   3. The mitral valve is normal in structure. No evidence of mitral valve  regurgitation. No evidence of mitral stenosis.   4. The aortic valve has an indeterminant number of cusps. Aortic valve  regurgitation is not visualized. Aortic valve sclerosis is present, with  no evidence of aortic valve stenosis.   5. The inferior vena cava is normal in size with greater than 50%  respiratory variability, suggesting right atrial pressure of 3 mmHg.   6. Agitated saline contrast bubble study was negative, with no evidence  of any interatrial shunt.   LHC 01/2022     Ost LAD to Prox LAD lesion is 85% stenosed.   Mid LAD lesion is 100% stenosed.   Dist LAD-1 lesion is 60% stenosed.   Dist LAD-2 lesion is 40% stenosed.   Ost Cx to Prox Cx lesion is 100% stenosed.   Prox RCA lesion is 95% stenosed.   Mid RCA lesion is 100% stenosed.   2nd Diag lesion is 95% stenosed.   Origin to Prox Graft lesion is 40% stenosed.   SVG and is normal in caliber.   SVG and is normal in caliber.   LIMA and is normal in caliber.   SVG and is normal in caliber.   The graft exhibits no disease.   The graft exhibits no disease.   The graft exhibits no disease.   The graft exhibits no disease.   1.  Severe  underlying three-vessel coronary artery disease with patent grafts including LIMA to LAD, SVG to diagonal/OM and SVG to right PDA.  Moderate mid to distal LAD disease post LIMA anastomosis. No significant change in coronary anatomy since most recent cardiac catheterization. 2.  Left ventricular angiography was not performed.  EF was normal by echo. 3.  Mildly elevated left ventricular end-diastolic pressure of 20 mmHg.   Recommendations: Elevated troponin is likely due to supply demand ischemia.  No culprit is identified.  Recommend continuing medical therapy.    Echo 01/2022 1. Left ventricular ejection fraction, by estimation, is 60 to 65%. The  left ventricle has normal function. The left ventricle has no regional  wall motion abnormalities. There is mild left ventricular hypertrophy.  Left ventricular diastolic parameters  are consistent with Grade II diastolic dysfunction (  pseudonormalization).   2. Right ventricular systolic function is normal. The right ventricular  size is normal.   3. The mitral valve is normal in structure. Mild mitral valve  regurgitation.   4. The aortic valve is tricuspid. Aortic valve regurgitation is not  visualized.   5. The inferior vena cava is dilated in size with <50% respiratory  variability, suggesting right atrial pressure of 15 mmHg.    Patient Profile     52 y.o. female with a hx of coronary artery disease status post CABG and right carotid endarterectomy in 2017 in the setting of non-STEMI, PAD, labile hypertension, chronic HFpEF, mixed hyperlipidemia, and type 2 diabetes mellitus who is being seen 07/30/2023 for the evaluation of elevated troponin   Assessment & Plan    Elevated Troponin CAD s/p CABG - elevated troponin in the setting of sepsis, hypotension, recurrent bilateral PNA, respiratory failure with hypoxia - HS troponin 991 and now down trending - no chest pain reported -suspect mostly supply demand mismatch, but cannot rule out  CAD - continue IV heparin x 48 hours - continue Aspirin. Plavix held - Monitor Hgb on ASA and IV heparin - Echo showed LVEF 60-65%, no WMA, G1DD - LHC in 2023 (report above) with no intervention - will eventually require LHC  Septic Shock Acute on chronic respiratory failure with hypoxia HCAP AMS - off pressors - abx per IM  Mixed HLD - PTA Evolocumab and Vascepa  For questions or updates, please contact [facility] Please consult [email or web address] for contact info under        Signed,  David Stall, PA-C  07/31/2023, 7:40 AM

## 2023-07-31 NOTE — Progress Notes (Signed)
Pt was transferred out to room 246. Pts v/s are stable and pt has no c/o pain or discomfort noted or stated.

## 2023-07-31 NOTE — Progress Notes (Signed)
PROGRESS NOTE    Erika [name]   MVH:[id number] DOB: March 24, 1971  DOA: 07/30/2023 Date of Service: 07/31/23 PCP: Eden Emms, NP     Brief Narrative / Hospital Course:  Erika [name] is 52 yo female who has a PMHx of chronic respiratory failure on 4L home O2 via [place], HTN, HLD, CABG & Endarterectomy s/p MI (2017), CVA w/ left-sided deficit, obesity, OSA, PAD, PNA, PTSD, Reflux gastritis (06/2023), T2DM who presents to [facility] ED for SOB and AMS while being treated for bilat PNA with Augmentin and Prednisone taper following discharge from [facility] on 07/26. Per patient's husband (former paramedic) he found her at home w/ BiPap and O2 off and SpO2 30%, he increased O2 to 10L but SpO2 only improved to 67% and noted AMS. At home, patient is normally on 4L home O2 via [place] and Bipap at night. Patient normally walks with a hemi walker due to left-sided deficits s/p CVA.  08/06: to ED. Vanc + cefepime for HAP coverage, hypotension requiring pressors, to ICU. CT chest "Multi lobar and dependent bilateral pneumonia, progressed since 07/16/2023 CTA. Consider sequelae of aspiration in this clinical setting." Elevated troponin, started heparin gtt, cardiology consult  08/07: hospitalist assumes care. Continue heparin x48h total then can restart plavix and d/c ASA.   Consultants:  Cardiology   Procedures: none      ASSESSMENT & PLAN:   Principal Problem:   Respiratory failure (HCC) Active Problems:   SOB (shortness of breath)   HCAP (healthcare-associated pneumonia)  Acute on chronic Respiratory Failure likely d/t aspiration pneumonia  Neg COVID, neg MRSA screen Supplemental O2 via [place] as needed to maintain O2 sats 90% Chest X-ray & ABG as needed Bronchodilators & Pulmicort nebs IV Steroids IV fluids  Pulmonary toilet as able Continue antibiotics empiric Vancomycin & Cefepime, pending cultures & sensitivities  Increase level of respiratory management if patient's status decompensates    Septic Shock, Source: Worsening PNA vs ?HAP Required pressors, now off  Continue telemetry  Continue frequent VS  Maintain MAP >55 Trend lactic acid to normal  Trend WBC Trend procalcitonin  Treat pneumonia as above   HFpEF Echo (07/17/23): LVEF 60-65%. Mild LV hypertrophy. LV diastolic parameters consistent w/ Grade I diastolic dysfunction. Diuresis as BP and renal function permits  Elevated troponin -NSTEMI Type II MI d/t likely demand ischemia but progression of her severe underlying CAD cannot be entirely excluded  136->450->991->918.  Cardiology following Continue IV heparin x48h total, low threshold to d/c if bleeding / Hgb drop Resume ASA, restart plavix and d/c ASA once heparin complete If she were to have angina and/or dynamic EKG changes, cardiac catheterization would need to be readdressed   Chronic cortical encephalomalacia likely s/p previous CVA Acute Metabolic Encephalopathy d/t sepsis, septic shock, NSTEMI CT Head wo Contrast: No acute intracranial abn. Chronic cortical encephalomalacia in right parietal and occipital lobes, and moderate brainstem atrophy and/or Wallerian degeneration - greater on the right Patient's left-sided deficits chronic Continue monitor mental status - has improved  Treatment of metabolic derangements as outlined above Provide supportive care Promote normal sleep/wake cycle and family presence Avoid sedating medications as able   T2DM Monitor fasting Glc   Renal Monitor I&O's / urinary output Follow BMP Ensure adequate renal perfusion Avoid nephrotoxic agents as able Replace electrolytes as indicated ~ Pharmacy following for assistance with electrolyte replacement      DVT prophylaxis: heparin gtt per ACS protocol  Pertinent IV fluids/nutrition: d/c IV fluids  Central lines / invasive  devices: none  Code Status: DNR/DNI per palliative care discussion today.   ACP documentation reviewed: 07/31/23 advanced directive on file  from 2021  Current Admission Status: inpatient  TOC needs / Dispo plan: anticipate d/c home, confrm home health / DME Barriers to discharge / significant pending items: clinical improvement, off heparin gtt tomorrow, possible d/c day after              Subjective / Brief ROS:  Patient reports feeling better this morning Denies CP Some SOB but better  Pain controlled.  Denies new weakness.  Tolerating diet.  Reports no concerns w/ urination/defecation.   Family Communication: husband at bedside on rounds     Objective Findings:  Vitals:   07/31/23 1300 07/31/23 1400 07/31/23 1500 07/31/23 1615  BP: (!) 115/56 (!) 115/53 (!) 142/59 (!) 137/59  Pulse: 74 72 74 73  Resp: 19 16 17 20   Temp:    98 F (36.7 C)  TempSrc:      SpO2: 96% 97% 100% 95%  Weight:      Height:        Intake/Output Summary (Last 24 hours) at 07/31/2023 1633 Last data filed at 07/31/2023 1500 Gross per 24 hour  Intake 2243.48 ml  Output 2325 ml  Net -81.52 ml   Filed Weights   07/30/23 1340  Weight: 88.3 kg    Examination:  Physical Exam Constitutional:      General: She is not in acute distress.    Appearance: She is obese. She is not ill-appearing.  Cardiovascular:     Rate and Rhythm: Normal rate and regular rhythm.  Pulmonary:     Effort: Pulmonary effort is normal.     Breath sounds: Normal breath sounds.  Musculoskeletal:     Right lower leg: No edema.     Left lower leg: No edema.  Skin:    General: Skin is warm and dry.  Neurological:     General: No focal deficit present.     Mental Status: She is alert and oriented to person, place, and time.  Psychiatric:        Mood and Affect: Mood normal.        Behavior: Behavior normal.          Scheduled Medications:   amitriptyline  10 mg Oral QHS   arformoterol  15 mcg Nebulization BID   aspirin EC  81 mg Oral Daily   budesonide (PULMICORT) nebulizer solution  0.5 mg Nebulization BID   Chlorhexidine Gluconate  Cloth  6 each Topical Daily   chlorproMAZINE  75 mg Oral QHS   clonazepam  0.25 mg Oral BID   cyclobenzaprine  10 mg Oral QHS   escitalopram  20 mg Oral Daily   feeding supplement (NEPRO CARB STEADY)  237 mL Oral TID BM   ferrous sulfate  325 mg Oral BID WC   fludrocortisone  0.1 mg Oral Daily   icosapent Ethyl  2 g Oral BID   lamoTRIgine  225 mg Oral QHS   methylPREDNISolone (SOLU-MEDROL) injection  80 mg Intravenous Daily   metoprolol succinate  25 mg Oral Daily   mouth rinse  15 mL Mouth Rinse 4 times per day   pantoprazole  40 mg Oral Daily   pregabalin  225 mg Oral BID   revefenacin  175 mcg Nebulization Daily   topiramate  25 mg Oral QHS    Continuous Infusions:  ceFEPime (MAXIPIME) IV 200 mL/hr at 07/31/23 1500   heparin 1,500  Units/hr (07/31/23 1614)    PRN Medications:  acetaminophen, albuterol, lip balm, mouth rinse, oxyCODONE  Antimicrobials from admission:  Anti-infectives (From admission, onward)    Start     Dose/Rate Route Frequency Ordered Stop   07/30/23 2200  ceFEPIme (MAXIPIME) 2 g in sodium chloride 0.9 % 100 mL IVPB        2 g 200 mL/hr over 30 Minutes Intravenous Every 8 hours 07/30/23 1313     07/30/23 2200  vancomycin (VANCOCIN) IVPB 1000 mg/200 mL premix  Status:  Discontinued        1,000 mg 200 mL/hr over 60 Minutes Intravenous Every 12 hours 07/30/23 1316 07/31/23 0927   07/30/23 1115  vancomycin (VANCOREADY) IVPB 750 mg/150 mL        750 mg 150 mL/hr over 60 Minutes Intravenous  Once 07/30/23 1008 07/30/23 1324   07/30/23 1015  vancomycin (VANCOCIN) IVPB 1000 mg/200 mL premix        1,000 mg 200 mL/hr over 60 Minutes Intravenous  Once 07/30/23 1006 07/30/23 1211   07/30/23 1015  ceFEPIme (MAXIPIME) 2 g in sodium chloride 0.9 % 100 mL IVPB        2 g 200 mL/hr over 30 Minutes Intravenous  Once 07/30/23 1006 07/30/23 1051           Data Reviewed:  I have personally reviewed the following...  CBC: Recent Labs  Lab 07/26/23 1628  07/30/23 0838 07/31/23 0304  WBC 9.6 21.1* 19.5*  HGB 9.5* 9.1* 8.5*  HCT 31.1* 31.2* 28.2*  MCV 70.5* 74.5* 72.3*  PLT 421* 374 368   Basic Metabolic Panel: Recent Labs  Lab 07/26/23 1628 07/30/23 0838 07/30/23 0912  NA 139 135 136  K 4.0 4.1 4.4  CL 100 97* 96*  CO2 32 30 31  GLUCOSE 146* 128* 114*  BUN 10 12 12   CREATININE 0.74 0.98 0.88  CALCIUM 9.5 9.2 9.4   GFR: Estimated Creatinine Clearance: 88.3 mL/min (by C-G formula based on SCr of 0.88 mg/dL). Liver Function Tests: Recent Labs  Lab 07/26/23 1628 07/30/23 0912  AST 20 30  ALT 22 16  ALKPHOS  --  65  BILITOT 0.2 0.9  PROT 6.1 6.5  ALBUMIN  --  3.4*   No results for input(s): "LIPASE", "AMYLASE" in the last 168 hours. No results for input(s): "AMMONIA" in the last 168 hours. Coagulation Profile: Recent Labs  Lab 07/30/23 0912 07/30/23 1110  INR SPECIMEN CLOTTED 1.3*   Cardiac Enzymes: No results for input(s): "CKTOTAL", "CKMB", "CKMBINDEX", "TROPONINI" in the last 168 hours. BNP (last 3 results) No results for input(s): "PROBNP" in the last 8760 hours. HbA1C: No results for input(s): "HGBA1C" in the last 72 hours. CBG: Recent Labs  Lab 07/30/23 1344  GLUCAP 135*   Lipid Profile: No results for input(s): "CHOL", "HDL", "LDLCALC", "TRIG", "CHOLHDL", "LDLDIRECT" in the last 72 hours. Thyroid Function Tests: No results for input(s): "TSH", "T4TOTAL", "FREET4", "T3FREE", "THYROIDAB" in the last 72 hours. Anemia Panel: No results for input(s): "VITAMINB12", "FOLATE", "FERRITIN", "TIBC", "IRON", "RETICCTPCT" in the last 72 hours. Most Recent Urinalysis On File:     Component Value Date/Time   COLORURINE AMBER (A) 07/30/2023 1021   APPEARANCEUR CLOUDY (A) 07/30/2023 1021   APPEARANCEUR Clear 09/11/2021 1413   LABSPEC 1.023 07/30/2023 1021   LABSPEC 1.024 01/11/2015 1022   PHURINE 6.0 07/30/2023 1021   GLUCOSEU NEGATIVE 07/30/2023 1021   GLUCOSEU >=1000 (A) 10/19/2019 1034   HGBUR NEGATIVE  07/30/2023 1021  BILIRUBINUR NEGATIVE 07/30/2023 1021   BILIRUBINUR Negative 09/11/2021 1413   BILIRUBINUR Negative 01/11/2015 1022   KETONESUR NEGATIVE 07/30/2023 1021   PROTEINUR NEGATIVE 07/30/2023 1021   UROBILINOGEN 1.0 10/19/2019 1049   UROBILINOGEN 1.0 10/19/2019 1034   NITRITE NEGATIVE 07/30/2023 1021   LEUKOCYTESUR NEGATIVE 07/30/2023 1021   LEUKOCYTESUR Negative 01/11/2015 1022   Sepsis Labs: @LABRCNTIP (procalcitonin:4,lacticidven:4) Microbiology: Recent Results (from the past 240 hour(s))  Blood Culture (routine x 2)     Status: None (Preliminary result)   Collection Time: 07/30/23  9:12 AM   Specimen: BLOOD  Result Value Ref Range Status   Specimen Description BLOOD LEFT ANTECUBITAL  Final   Special Requests   Final    BOTTLES DRAWN AEROBIC AND ANAEROBIC Blood Culture adequate volume   Culture   Final    NO GROWTH < 24 HOURS Performed at [facility], [address] [place], [place] [address]    Report Status PENDING  Incomplete  Blood Culture (routine x 2)     Status: None (Preliminary result)   Collection Time: 07/30/23  9:12 AM   Specimen: BLOOD  Result Value Ref Range Status   Specimen Description BLOOD RIGHT ANTECUBITAL  Final   Special Requests   Final    BOTTLES DRAWN AEROBIC AND ANAEROBIC Blood Culture adequate volume   Culture   Final    NO GROWTH < 24 HOURS Performed at [facility], [address], [place], [place] [address]    Report Status PENDING  Incomplete  SARS Coronavirus 2 by RT PCR (hospital order, performed in [facility] hospital lab) *cepheid single result test* Anterior Nasal Swab     Status: None   Collection Time: 07/30/23  9:39 AM   Specimen: Anterior Nasal Swab  Result Value Ref Range Status   SARS Coronavirus 2 by RT PCR NEGATIVE NEGATIVE Final    Comment: (NOTE) SARS-CoV-2 target nucleic acids are NOT DETECTED.  The SARS-CoV-2 RNA is generally detectable in upper and lower respiratory specimens during the  acute phase of infection. The lowest concentration of SARS-CoV-2 viral copies this assay can detect is 250 copies / mL. A negative result does not preclude SARS-CoV-2 infection and should not be used as the sole basis for treatment or other patient management decisions.  A negative result may occur with improper specimen collection / handling, submission of specimen other than nasopharyngeal swab, presence of viral mutation(s) within the areas targeted by this assay, and inadequate number of viral copies (<250 copies / mL). A negative result must be combined with clinical observations, patient history, and epidemiological information.  Fact Sheet for Patients:   RoadLapTop.co.za  Fact Sheet for Healthcare Providers: http://kim-miller.com/  This test is not yet approved or  cleared by the [place] FDA and has been authorized for detection and/or diagnosis of SARS-CoV-2 by FDA under an Emergency Use Authorization (EUA).  This EUA will remain in effect (meaning this test can be used) for the duration of the COVID-19 declaration under Section 564(b)(1) of the Act, 21 U.S.C. section 360bbb-3(b)(1), unless the authorization is terminated or revoked sooner.  Performed at [facility], [address] [place], [place] [address]   MRSA Next Gen by PCR, Nasal     Status: None   Collection Time: 07/30/23  1:40 PM   Specimen: Nasal Mucosa; Nasal Swab  Result Value Ref Range Status   MRSA by PCR Next Gen NOT DETECTED NOT DETECTED Final    Comment: (NOTE) The GeneXpert MRSA Assay (FDA  approved for NASAL specimens only), is one component of a comprehensive MRSA colonization surveillance program. It is not intended to diagnose MRSA infection nor to guide or monitor treatment for MRSA infections. Test performance is not FDA approved in patients less than 68 years old. Performed at [facility], [address]  [place], [place] [address]       Radiology Studies last 3 days: CT Angio Chest Pulmonary Embolism (PE) W or WO Contrast  Result Date: 07/30/2023 CLINICAL DATA:  52 year old female with altered mental status, abnormal breathing, undergoing treatment for pneumonia. History of previous cerebral infarct with left side deficit. EXAM: CT ANGIOGRAPHY CHEST WITH CONTRAST TECHNIQUE: Multidetector CT imaging of the chest was performed using the standard protocol during bolus administration of intravenous contrast. Multiplanar CT image reconstructions and MIPs were obtained to evaluate the vascular anatomy. RADIATION DOSE REDUCTION: This exam was performed according to the departmental dose-optimization program which includes automated exposure control, adjustment of the mA and/or kV according to patient size and/or use of iterative reconstruction technique. CONTRAST:  75mL OMNIPAQUE IOHEXOL 350 MG/ML SOLN COMPARISON:  Portable chest 0918 hours today.  Prior CTA 07/16/2023. Speech pathology evaluation 07/05/2023. FINDINGS: Cardiovascular: Good contrast bolus timing in the pulmonary arterial tree. No pulmonary artery filling defect. Prior CABG. Calcified aortic atherosclerosis. Cardiac size remains within normal limits. No pericardial effusion. Mediastinum/Nodes: Postoperative changes. Reactive appearing mediastinal lymph nodes including right paratracheal, similar to last month. No mediastinal mass or suspicious lymphadenopathy. Lungs/Pleura: Major airways are patent with some atelectatic changes. But there is bilateral lower lobe consolidation with air bronchograms, similar to but progressed since 07/16/2023. No pleural effusions. And there is similar dependent peribronchial opacity now in the middle lobes, additional patchy and nodular middle lobe bronchovascular involvement, and early mostly dependent peribronchial involvement also in the upper lobes. Upper Abdomen: Negative visible liver, spleen, pancreas, adrenal glands  and bowel in the upper abdomen. Musculoskeletal: Sternotomy. No acute osseous abnormality identified. Review of the MIP images confirms the above findings. IMPRESSION: 1. Negative for acute pulmonary embolus. 2. Multi lobar and dependent bilateral pneumonia, progressed since 07/16/2023 CTA. Consider sequelae of aspiration in this clinical setting. 3.  Aortic Atherosclerosis (ICD10-I70.0).  Prior CABG. Electronically Signed   By: Odessa Fleming M.D.   On: 07/30/2023 11:13   CT Head Wo Contrast  Result Date: 07/30/2023 CLINICAL DATA:  52 year old female with altered mental status, abnormal breathing, undergoing treatment for pneumonia. History of previous cerebral infarct with left side deficit. EXAM: CT HEAD WITHOUT CONTRAST TECHNIQUE: Contiguous axial images were obtained from the base of the skull through the vertex without intravenous contrast. RADIATION DOSE REDUCTION: This exam was performed according to the departmental dose-optimization program which includes automated exposure control, adjustment of the mA and/or kV according to patient size and/or use of iterative reconstruction technique. COMPARISON:  Brain MRI 07/16/2023.  Head CT 06/11/2023. FINDINGS: Brain: Right parietal and occipital lobe cortical encephalomalacia, gliosis appears stable. And there is chronic right brainstem atrophy, confirmed on the MRI last month. No superimposed No midline shift, ventriculomegaly, mass effect, evidence of mass lesion, intracranial hemorrhage or evidence of cortically based acute infarction. Gray-white matter differentiation is within normal limits throughout the brain. Vascular: No suspicious intracranial vascular hyperdensity. Calcified atherosclerosis at the skull base. Skull: Intact.  No acute osseous abnormality identified. Sinuses/Orbits: Visualized paranasal sinuses and mastoids are stable and well aerated. Other: Visualized orbits and scalp soft tissues are within normal limits. IMPRESSION: 1. No acute  intracranial abnormality. 2. Chronic cortical  encephalomalacia in the right parietal and occipital lobes, and moderate brainstem atrophy and/or Wallerian degeneration - greater on the right. Electronically Signed   By: Odessa Fleming M.D.   On: 07/30/2023 10:59   DG Chest Port 1 View  Result Date: 07/30/2023 CLINICAL DATA:  Abnormal breathing EXAM: PORTABLE CHEST 1 VIEW COMPARISON:  CXR 07/16/23 FINDINGS: Status post median sternotomy and CABG. Unchanged cardiac and mediastinal contours. No large pleural effusion. No pneumothorax. There are prominent bilateral interstitial opacities there are somewhat basal predominant. Findings could represent pulmonary edema or multifocal infection. No radiographically apparent displaced rib fractures. Visualized upper abdomen is unremarkable. IMPRESSION: Prominent bilateral interstitial opacities, which are somewhat basal predominant. Findings could represent pulmonary edema or multifocal infection. Electronically Signed   By: Lorenza Cambridge M.D.   On: 07/30/2023 09:29             LOS: 1 day    Time spent: 50 min    Sunnie Nielsen, DO [facility] 07/31/2023, 4:33 PM    Dictation software may have been used to generate the above note. Typos may occur and escape review in typed/dictated notes. Please contact Dr Lyn Hollingshead directly for clarity if needed.  Staff may message me via secure chat in Epic  but this may not receive an immediate response,  please page me for urgent matters!  If 7PM-7AM, please contact night coverage [email or web address]

## 2023-07-31 NOTE — TOC Initial Note (Addendum)
Transition of Care [facility]) - Initial/Assessment Note    Patient Details  Name: Erika [name] MRN: [id number] Date of Birth: 1971/04/20  Transition [facility] Care [facility]) CM/SW Contact:    Kreg Shropshire, RN Phone Number: 07/31/2023, 1:51 PM  Clinical Narrative:                 Pt arrived from ED from: Home Caregiver Support: lives with family DME at Home: Oxygen, wheelchair, and walker Transportation: Family Previous Services: Active with [facility] HH/SNF Preference: Active With [employer] First Person of Contact: Spouse Vicki Mallet PCP: Mordecai Maes, NP  Readmission prevention screening completed.   Cm will continue to follow for toc needs and d/c planning.     Barriers to Discharge: Continued Medical Work up   Patient Goals and CMS Choice   CMS Medicare.gov Compare Post Acute Care list provided to:: Patient Choice offered to / list presented to : Patient      Expected Discharge Plan and Services     Post Acute Care Choice: Home Health, Skilled Nursing Facility Living arrangements for the past 2 months: Single Family Home                                      Prior Living Arrangements/Services Living arrangements for the past 2 months: Single Family Home Lives with:: Self, Spouse          Need for Family Participation in Patient Care: Yes (Comment) Care giver support system in place?: Yes (comment) Current home services: DME    Activities of Daily Living      Permission Sought/Granted                  Emotional Assessment Appearance:: Appears stated age Attitude/Demeanor/Rapport: Engaged Affect (typically observed): Calm Orientation: : Oriented to Self, Oriented to Place, Oriented to  Time, Oriented to Situation      Admission diagnosis:  Respiratory failure (HCC) [J96.90] SOB (shortness of breath) [R06.02] Hypoxia [R09.02] HCAP (healthcare-associated pneumonia) [J18.9] Patient Active Problem List   Diagnosis Date Noted    Respiratory failure (HCC) 07/30/2023   Hospital discharge follow-up 07/26/2023   COPD with acute exacerbation (HCC) 07/18/2023   Chronic hypotension 07/17/2023   Chronic hypoxemic respiratory failure (HCC) 07/17/2023   Hypokalemia 07/17/2023   Iron deficiency anemia 07/17/2023   Syncope and collapse 07/16/2023   Infestation by bed bug 07/16/2023   Aspiration pneumonia of both lungs (HCC) 07/12/2023   Anemia 07/12/2023   Orthostatic hypotension 07/12/2023   COPD with chronic bronchitis and emphysema (HCC) 07/01/2023   HCAP (healthcare-associated pneumonia) 06/13/2023   Hypoxia 06/13/2023   AKI (acute kidney injury) (HCC) 06/12/2023   SOB (shortness of breath) 05/27/2023   Adventitious breath sounds 03/05/2023   Complex sleep apnea syndrome 10/03/2022   Scalp psoriasis 06/11/2022   Coccyx pain 06/11/2022   History of tobacco abuse 05/25/2022   Dermatitis 03/19/2022   Mild vascular dementia without behavioral disturbance, psychotic disturbance, mood disturbance, or anxiety (HCC) 03/01/2022   Acute metabolic encephalopathy/delirium/polypharmacy 01/31/2022   Demand ischemia    Pressure injury of skin 01/27/2022   Cardiomegaly    Polypharmacy    COVID-19 virus infection 12/22/2021   Hyperglycemia 12/22/2021   Major depressive disorder, recurrent episode, mild (HCC) 11/26/2021   Ankle fracture, left    Closed avulsion fracture of medial malleolus of left tibia    Cerebrovascular accident (CVA) due to occlusion of  vertebral artery (HCC) 09/11/2021   Cognitive change 09/11/2021   Cognitive communication deficit 08/10/2021   Urinary incontinence 08/10/2021   GERD (gastroesophageal reflux disease) 08/03/2021   Chronic post-traumatic stress disorder (PTSD)    Depression    Right pontine stroke (HCC) 06/28/2021   Hemiplegia and hemiparesis following cerebral infarction affecting left non-dominant side (HCC) 06/24/2021   Chronic diastolic CHF (congestive heart failure) (HCC) 06/24/2021    Fall 06/24/2021   Abnormal LFTs 06/24/2021   Olecranon bursitis of left elbow 06/12/2021   Chronic hip pain (Bilateral) 06/09/2021   Chronic use of opiate for therapeutic purpose 05/09/2021   Greater trochanteric bursitis of hip (Left) 03/09/2021   Bursitis of hip (Left) 02/23/2021   Uncomplicated opioid dependence (HCC) 02/20/2021   Acute conjunctivitis of left eye 01/02/2021   Unintentional weight loss 11/21/2020   Broken teeth (Right) 08/02/2020   Neuropathy 06/09/2020   Dental abscess 06/09/2020   Foot drop (Left) 06/09/2020   Carotid stenosis, asymptomatic, right 04/27/2020   Migraine, chronic, without aura 11/03/2019   Reactive thrombocytosis 09/16/2019   Erythrocytosis 09/16/2019   Hypercalcemia 09/06/2019   Leukocytosis 09/06/2019   Polycythemia 09/06/2019   Chronic hip pain (Left) 08/27/2019   Osteoarthritis of hip (Left) 08/27/2019   Gluteal tendonitis of buttock (Left) 08/27/2019   Radial nerve palsy (Right) 07/15/2019   Neuropathy of radial nerve (Right) 06/30/2019   Abnormal bruising 06/25/2019   History of carotid endarterectomy (Right) 03/04/2019   Pain medication agreement signed 03/04/2019   Atypical facial pain (Right) 01/27/2019   Chronic ear pain (Right) 01/27/2019   Chronic jaw pain (Right) 01/27/2019   Geniculate Neuralgia (Right) 01/27/2019   Vitamin D deficiency 01/19/2019   Neurogenic pain 01/19/2019   Chronic anticoagulation (PLAVIX) 01/19/2019   Chronic pain syndrome 01/07/2019   Long term current use of opiate analgesic 01/07/2019   Long term prescription benzodiazepine use 01/07/2019   Pharmacologic therapy 01/07/2019   Disorder of skeletal system 01/07/2019   Problems influencing health status 01/07/2019   Chronic headaches (1ry area of Pain) (Right) 01/07/2019   Opiate use 09/24/2018   PVD (peripheral vascular disease) (HCC) 06/24/2018   Chronic ankle pain (Bilateral) 12/27/2017   Tendinopathy of gluteus medius (Right) 12/27/2017    Tendinopathy of gluteus medius (Left) 12/27/2017   Bilateral hip pain 12/26/2017   Chronic elbow pain (Left) 10/17/2017   Elevated troponin I level 05/21/2017   Severe sepsis (HCC) 05/19/2017   Major depressive disorder, recurrent episode, moderate (HCC) 11/19/2016   Insomnia 10/30/2016   Constipation 07/25/2016   S/P CABG x 4 07/06/2016   Bradycardia    Carotid stenosis    Elevated troponin 06/28/2016   Malignant hypertension    Type 2 diabetes mellitus with other specified complication (HCC)    Chest pain with high risk for cardiac etiology 06/27/2016   NSTEMI (non-ST elevated myocardial infarction) (HCC) 06/27/2016   Proteinuria 03/15/2016   Renal artery stenosis (HCC) 03/15/2016   MDD (major depressive disorder) 10/17/2015   Agoraphobia with panic attacks 04/25/2015   Abnormal CBC 04/13/2013   Debility 04/02/2013   Cluster headache 03/20/2012   HLD (hyperlipidemia) 07/26/2010   PCP:  Eden Emms, NP Pharmacy:   [facility] [address] [facility] - [place], [place] - [address] AT [facility] [address] [place] [address] Phone: [phone] Fax: [phone]     Social Determinants of Health (SDOH) Social History: SDOH Screenings   Food Insecurity: No Food Insecurity (07/29/2023)  Housing: Patient Unable To Answer (  07/29/2023)  Transportation Needs: No Transportation Needs (07/29/2023)  Utilities: Not At Risk (07/17/2023)  Recent Concern: Utilities - At Risk (06/13/2023)  Depression (PHQ2-9): High Risk (07/26/2023)  Tobacco Use: Medium Risk (07/26/2023)   SDOH Interventions:     Readmission Risk Interventions    07/31/2023    1:50 PM 07/17/2023    2:19 PM 12/24/2021   12:23 PM  Readmission Risk Prevention Plan  Transportation Screening Complete Complete Complete  PCP or Specialist Appt within 3-5 Days  Complete   HRI or Home Care Consult  Complete   Social Work Consult for Recovery Care Planning/Counseling  Complete   Palliative Care Screening   Complete   Medication Review Oceanographer) Referral to Pharmacy Referral to Pharmacy Complete  PCP or Specialist appointment within 3-5 days of discharge Complete  Complete  HRI or Home Care Consult Complete  Complete  SW Recovery Care/Counseling Consult Complete    Palliative Care Screening Not Applicable    Skilled Nursing Facility Not Applicable

## 2023-07-31 NOTE — Consult Note (Signed)
ANTICOAGULATION CONSULT NOTE   Pharmacy Consult for Heparin infusion Indication: chest pain/ACS  No Known Allergies  Patient Measurements: Height: 5\' 7"  (170.2 cm) Weight: 88.3 kg (194 lb 10.7 oz) IBW/kg (Calculated) : 61.6 Heparin Dosing Weight: 80.4 kg  Vital Signs: Temp: 98.4 F (36.9 C) (08/07 0300) Temp Source: Oral (08/07 0300) BP: 158/136 (08/07 0300) Pulse Rate: 65 (08/07 0300)  Labs: Recent Labs    07/30/23 0838 07/30/23 0912 07/30/23 1110 07/30/23 1130 07/30/23 1711 07/30/23 1821 07/31/23 0304  HGB 9.1*  --   --   --   --   --  8.5*  HCT 31.2*  --   --   --   --   --  28.2*  PLT 374  --   --   --   --   --  368  APTT  --  SPECIMEN CLOTTED 41*  --   --   --   --   LABPROT  --  SPECIMEN CLOTTED 16.0*  --   --   --   --   INR  --  SPECIMEN CLOTTED 1.3*  --   --   --   --   HEPARINUNFRC  --   --   --   --   --   --  0.11*  CREATININE 0.98 0.88  --   --   --   --   --   TROPONINIHS 136*  --   --  450* 991* 918*  --     Estimated Creatinine Clearance: 88.3 mL/min (by C-G formula based on SCr of 0.88 mg/dL).   Medical History: Past Medical History:  Diagnosis Date   Acute respiratory failure with hypoxia (HCC) 06/24/2021   Arthralgia of temporomandibular joint    CAD, multiple vessel    a. 06/2016 Cath: ostLM 40%, ostLAD 40%, pLAD 95%, ost-pLCx 60%, pLCx 95%, mLCx 60%, mRCA 95%, D2 50%, LVSF nl;  b. 07/2016 CABG x 4 (LIMA->LAD, VG->Diag, VG->OM, VG->RCA); c. 08/2016 Cath: 3VD w/ 4/4 patent grafts. LAD distal to LIMA has diff dzs->Med rx; d. 08/2020 Cath: 4/4 patent grafts, native 3VD. EF 55-65%-->Med Rx.   Carotid arterial disease (HCC)    a. 07/2016 s/p R CEA; b. 02/2021 U/S: RICA 40-59%, LICA 1-39%.   Cerebrovascular disease    Clotting disorder (HCC)    Complex sleep apnea syndrome 10/03/2022   Depression    Diastolic dysfunction    a. 06/2016 Echo: EF 50-55%, mild inf wall HK, GR1DD, mild MR, RV sys fxn nl, mildly dilated LA, PASP nl; b. 06/2021 Echo: EF  60-65%, no rwma. Nl RV fxn.   Essential hypertension    Essential hypertension, malignant 06/28/2016   Fatty liver disease, nonalcoholic 2016   History of blood transfusion    with heart surgery   History of MI (myocardial infarction) (July 2017) 08/02/2020   HLD (hyperlipidemia)    HTN (hypertension), malignant 10/20/2013   Labile hypertension    a. prior renal ngiogram negative for RAS in 03/2016; b. catecholamines and metanephrines normal, mildly elevated renin with normal aldosterone and normal ratio in 02/2016   Mild tobacco abuse in early remission 06/28/2016   Myocardial infarction [facility]) 2017   Obesity    PAD (peripheral artery disease) (HCC)    a. 09/2018 s/p L SFA stenting; b. 07/2019 Periph Angio: Patent m/d L SFA stent w/ 100% L SFA distal to stent. L AT 100d, L Peroneal diff dzs-->Med Rx; c. 02/2021 ABIs: stable @ 0.61 on R and  0.46 on L.   PTSD (post-traumatic stress disorder)    Reflux gastritis 07/01/2023   Stroke (HCC)    Tobacco abuse    Type 2 diabetes mellitus (HCC) 12/2015    Medications:  No AC prior to admission. Last dose of Plavix 07/29/2023  Assessment: Patient with hx of CAD s/p CABG in setting of NSTEMI, PAD, labile hypertension, chronic HFpEF, mixed hyperlipidemia, and type 2 diabetes mellitus. Admitted with SOB and elevated troponin. Pharmacy consulted to manage heparin infusion for ACS.  Baseline PLT WNL. H?H low but improved in last 2 weeks. No S/Sx of bleeding noted per chart review  Goal of Therapy:  Heparin level 0.3-0.7 units/ml Monitor platelets by anticoagulation protocol: Yes  08/07 0304 HL 0.11, subtherapeutic  Plan:  Bolus 2400 units x 1 Increase heparin infusion to 1250 units/hr Will recheck HL in 6 hr after rate change CBC daily while on heparin  Otelia Sergeant, PharmD, [facility] 07/31/2023 3:37 AM

## 2023-07-31 NOTE — Consult Note (Signed)
ANTICOAGULATION CONSULT NOTE   Pharmacy Consult for Heparin Infusion Indication: chest pain/ACS  Patient Measurements: Height: 5\' 7"  (170.2 cm) Weight: 88.3 kg (194 lb 10.7 oz) IBW/kg (Calculated) : 61.6 Heparin Dosing Weight: 80.4 kg  Labs: Recent Labs    07/30/23 0838 07/30/23 0912 07/30/23 1110 07/30/23 1130 07/30/23 1711 07/30/23 1821 07/31/23 0304 07/31/23 1002 07/31/23 1815  HGB 9.1*  --   --   --   --   --  8.5*  --   --   HCT 31.2*  --   --   --   --   --  28.2*  --   --   PLT 374  --   --   --   --   --  368  --   --   APTT  --  SPECIMEN CLOTTED 41*  --   --   --   --   --   --   LABPROT  --  SPECIMEN CLOTTED 16.0*  --   --   --   --   --   --   INR  --  SPECIMEN CLOTTED 1.3*  --   --   --   --   --   --   HEPARINUNFRC  --   --   --   --   --   --  0.11* 0.11* 0.32  CREATININE 0.98 0.88  --   --   --   --   --   --   --   TROPONINIHS 136*  --   --  450* 991* 918*  --   --   --     Estimated Creatinine Clearance: 88.3 mL/min (by C-G formula based on SCr of 0.88 mg/dL).   Medical History: Past Medical History:  Diagnosis Date   Acute respiratory failure with hypoxia (HCC) 06/24/2021   Arthralgia of temporomandibular joint    CAD, multiple vessel    a. 06/2016 Cath: ostLM 40%, ostLAD 40%, pLAD 95%, ost-pLCx 60%, pLCx 95%, mLCx 60%, mRCA 95%, D2 50%, LVSF nl;  b. 07/2016 CABG x 4 (LIMA->LAD, VG->Diag, VG->OM, VG->RCA); c. 08/2016 Cath: 3VD w/ 4/4 patent grafts. LAD distal to LIMA has diff dzs->Med rx; d. 08/2020 Cath: 4/4 patent grafts, native 3VD. EF 55-65%-->Med Rx.   Carotid arterial disease (HCC)    a. 07/2016 s/p R CEA; b. 02/2021 U/S: RICA 40-59%, LICA 1-39%.   Cerebrovascular disease    Clotting disorder (HCC)    Complex sleep apnea syndrome 10/03/2022   Depression    Diastolic dysfunction    a. 06/2016 Echo: EF 50-55%, mild inf wall HK, GR1DD, mild MR, RV sys fxn nl, mildly dilated LA, PASP nl; b. 06/2021 Echo: EF 60-65%, no rwma. Nl RV fxn.   Essential  hypertension    Essential hypertension, malignant 06/28/2016   Fatty liver disease, nonalcoholic 2016   History of blood transfusion    with heart surgery   History of MI (myocardial infarction) (July 2017) 08/02/2020   HLD (hyperlipidemia)    HTN (hypertension), malignant 10/20/2013   Labile hypertension    a. prior renal ngiogram negative for RAS in 03/2016; b. catecholamines and metanephrines normal, mildly elevated renin with normal aldosterone and normal ratio in 02/2016   Mild tobacco abuse in early remission 06/28/2016   Myocardial infarction [facility]) 2017   Obesity    PAD (peripheral artery disease) (HCC)    a. 09/2018 s/p L SFA stenting; b. 07/2019 Periph Angio: Patent m/d L  SFA stent w/ 100% L SFA distal to stent. L AT 100d, L Peroneal diff dzs-->Med Rx; c. 02/2021 ABIs: stable @ 0.61 on R and 0.46 on L.   PTSD (post-traumatic stress disorder)    Reflux gastritis 07/01/2023   Stroke (HCC)    Tobacco abuse    Type 2 diabetes mellitus (HCC) 12/2015    Medications:  No AC prior to admission. Last dose of Plavix 07/29/2023  Assessment: Patient with hx of CAD s/p CABG in setting of NSTEMI, PAD, labile hypertension, chronic HFpEF, mixed hyperlipidemia, and type 2 diabetes mellitus. Admitted with SOB and elevated troponin. Pharmacy consulted to manage heparin infusion for ACS.  Baseline PLT WNL. H?H low but improved in last 2 weeks. No S/Sx of bleeding noted per chart review  Goal of Therapy:  Heparin level 0.3-0.7 units/ml Monitor platelets by anticoagulation protocol: Yes  0807 0304 HL 0.11, subtherapeutic; 1000 un/hr 0807 1002 HL 0.11, subtherapeutic; 1250 un/hr 0807 1815 HL 0.32, Therapeutic; 1500 un/hr  Plan:  Level is therapeutic x 1.  Continue current infusion rate of 1500 units/hr Will recheck confirmatory HL in 6 hr  CBC daily while on heparin per protocol   Gardner Candle, PharmD, BCPS Clinical Pharmacist 07/31/2023 6:57 PM

## 2023-08-01 ENCOUNTER — Other Ambulatory Visit (HOSPITAL_COMMUNITY): Payer: Self-pay

## 2023-08-01 DIAGNOSIS — Z7189 Other specified counseling: Secondary | ICD-10-CM | POA: Diagnosis not present

## 2023-08-01 DIAGNOSIS — I2489 Other forms of acute ischemic heart disease: Secondary | ICD-10-CM | POA: Diagnosis not present

## 2023-08-01 DIAGNOSIS — J189 Pneumonia, unspecified organism: Secondary | ICD-10-CM | POA: Diagnosis not present

## 2023-08-01 DIAGNOSIS — A419 Sepsis, unspecified organism: Secondary | ICD-10-CM

## 2023-08-01 LAB — HEPARIN LEVEL (UNFRACTIONATED)
Heparin Unfractionated: 0.5 IU/mL (ref 0.30–0.70)
Heparin Unfractionated: 0.45 IU/mL (ref 0.30–0.70)

## 2023-08-01 MED ORDER — POTASSIUM CHLORIDE CRYS ER 20 MEQ PO TBCR
40.0000 meq | EXTENDED_RELEASE_TABLET | Freq: Once | ORAL | Status: AC
  Administered 2023-08-01: 40 meq via ORAL
  Filled 2023-08-01: qty 2

## 2023-08-01 MED ORDER — POLYETHYLENE GLYCOL 3350 17 G PO PACK
17.0000 g | PACK | Freq: Every day | ORAL | Status: DC | PRN
  Administered 2023-08-01 – 2023-08-02 (×2): 17 g via ORAL
  Filled 2023-08-01 (×2): qty 1

## 2023-08-01 MED ORDER — CLOPIDOGREL BISULFATE 75 MG PO TABS
75.0000 mg | ORAL_TABLET | Freq: Every day | ORAL | Status: DC
  Administered 2023-08-02: 75 mg via ORAL
  Filled 2023-08-01: qty 1

## 2023-08-01 MED ORDER — HEPARIN BOLUS VIA INFUSION
1200.0000 [IU] | Freq: Once | INTRAVENOUS | Status: AC
  Administered 2023-08-01: 1200 [IU] via INTRAVENOUS
  Filled 2023-08-01: qty 1200

## 2023-08-01 NOTE — Consult Note (Signed)
ANTICOAGULATION CONSULT NOTE   Pharmacy Consult for Heparin Infusion Indication: chest pain/ACS  Patient Measurements: Height: 5\' 7"  (170.2 cm) Weight: 88.3 kg (194 lb 10.7 oz) IBW/kg (Calculated) : 61.6 Heparin Dosing Weight: 80.4 kg  Labs: Recent Labs    07/30/23 0838 07/30/23 0912 07/30/23 1110 07/30/23 1130 07/30/23 1711 07/30/23 1821 07/31/23 0304 07/31/23 1002 07/31/23 1815 08/01/23 0031 08/01/23 0523 08/01/23 0807  HGB 9.1*  --   --   --   --   --  8.5*  --   --   --  9.8*  --   HCT 31.2*  --   --   --   --   --  28.2*  --   --   --  33.9*  --   PLT 374  --   --   --   --   --  368  --   --   --  463*  --   APTT  --  SPECIMEN CLOTTED 41*  --   --   --   --   --   --   --   --   --   LABPROT  --  SPECIMEN CLOTTED 16.0*  --   --   --   --   --   --   --   --   --   INR  --  SPECIMEN CLOTTED 1.3*  --   --   --   --   --   --   --   --   --   HEPARINUNFRC  --   --   --   --   --   --  0.11*   < > 0.32 0.26*  --  0.50  CREATININE 0.98 0.88  --   --   --   --   --   --   --   --  0.73  --   TROPONINIHS 136*  --   --  450* 991* 918*  --   --   --   --   --   --    < > = values in this interval not displayed.    Estimated Creatinine Clearance: 97.1 mL/min (by C-G formula based on SCr of 0.73 mg/dL).   Medical History: Past Medical History:  Diagnosis Date   Acute respiratory failure with hypoxia (HCC) 06/24/2021   Arthralgia of temporomandibular joint    CAD, multiple vessel    a. 06/2016 Cath: ostLM 40%, ostLAD 40%, pLAD 95%, ost-pLCx 60%, pLCx 95%, mLCx 60%, mRCA 95%, D2 50%, LVSF nl;  b. 07/2016 CABG x 4 (LIMA->LAD, VG->Diag, VG->OM, VG->RCA); c. 08/2016 Cath: 3VD w/ 4/4 patent grafts. LAD distal to LIMA has diff dzs->Med rx; d. 08/2020 Cath: 4/4 patent grafts, native 3VD. EF 55-65%-->Med Rx.   Carotid arterial disease (HCC)    a. 07/2016 s/p R CEA; b. 02/2021 U/S: RICA 40-59%, LICA 1-39%.   Cerebrovascular disease    Clotting disorder (HCC)    Complex sleep apnea  syndrome 10/03/2022   Depression    Diastolic dysfunction    a. 06/2016 Echo: EF 50-55%, mild inf wall HK, GR1DD, mild MR, RV sys fxn nl, mildly dilated LA, PASP nl; b. 06/2021 Echo: EF 60-65%, no rwma. Nl RV fxn.   Essential hypertension    Essential hypertension, malignant 06/28/2016   Fatty liver disease, nonalcoholic 2016   History of blood transfusion    with heart surgery   History of MI (myocardial  infarction) (July 2017) 08/02/2020   HLD (hyperlipidemia)    HTN (hypertension), malignant 10/20/2013   Labile hypertension    a. prior renal ngiogram negative for RAS in 03/2016; b. catecholamines and metanephrines normal, mildly elevated renin with normal aldosterone and normal ratio in 02/2016   Mild tobacco abuse in early remission 06/28/2016   Myocardial infarction [facility]) 2017   Obesity    PAD (peripheral artery disease) (HCC)    a. 09/2018 s/p L SFA stenting; b. 07/2019 Periph Angio: Patent m/d L SFA stent w/ 100% L SFA distal to stent. L AT 100d, L Peroneal diff dzs-->Med Rx; c. 02/2021 ABIs: stable @ 0.61 on R and 0.46 on L.   PTSD (post-traumatic stress disorder)    Reflux gastritis 07/01/2023   Stroke (HCC)    Tobacco abuse    Type 2 diabetes mellitus (HCC) 12/2015    Medications:  No AC prior to admission. Last dose of Plavix 07/29/2023  Assessment: Patient with hx of CAD s/p CABG in setting of NSTEMI, PAD, labile hypertension, chronic HFpEF, mixed hyperlipidemia, and type 2 diabetes mellitus. Admitted with SOB and elevated troponin. Pharmacy consulted to manage heparin infusion for ACS.  Baseline PLT WNL. H?H low but improved in last 2 weeks. No S/Sx of bleeding noted per chart review  Goal of Therapy:  Heparin level 0.3-0.7 units/ml Monitor platelets by anticoagulation protocol: Yes  0807 0304 HL 0.11, subtherapeutic; 1000 un/hr 0807 1002 HL 0.11, subtherapeutic; 1250 un/hr 0807 1815 HL 0.32, Therapeutic; 1500 un/hr 0808 0031 HL 0.26, subtherapeutic 1500 un/hr 0807 0807  HL 0.5, therapeutic.    Plan:  Heparin level is therapeutic. Will continue heparin infusion at 1700 units/hr. Recheck heparin level in 6 hours and CBC daily while on heparin. Plan for 48 hours of heparin.   Paschal Dopp, PharmD, 08/01/2023 11:11 AM

## 2023-08-01 NOTE — Consult Note (Addendum)
   [facility] Inpatient Consult   08/01/2023  Erika [name] 07/13/1971 [id number]  [facility] [[facility]]  Accountable Care Organization [ACO] Patient:  Medicare ACO REACH  *[facility] RN Hospital Liaison remote coverage review for [name] RN HLfor  patient admitted to [facility]    *On extreme high risk for unplanned readmission list  Primary Care Provider:  Eden Emms, NP with [place] [facility] is listed to provide the transition of care follow up  Patient reviewed for less than 30 days readmission with extreme high risk score and  is currently active with [facility] [[facility]] Care Management for care coordination services.  Patient has been engaged by a [employer] on behalf of [facility].  Our community based plan of care has focused on disease management and community resource support.    Chart reviewed for care coordination needs and noted Palliative consult.  Plan:  Will update RN [facility] of admission and Hospital Liaisons are following for any new post hospital care coordination needs.   Of note, [facility] Care Management services does not replace or interfere with any services that are needed or arranged by inpatient [facility] care management team.   For additional questions or referrals please contact:  Charlesetta Shanks, RN BSN CCM [facility]  [phone] business mobile phone Toll free office [phone]  *Concierge Line  [phone] Fax number: [phone] [email or web address]

## 2023-08-01 NOTE — Progress Notes (Addendum)
   Patient Name: Erika [name] Date of Encounter: 08/01/2023 [facility] Cardiologist: Lorine Bears, MD   Interval Summary  .    Breathing continues to improve.  No chest pain reported.  No palpitations, lightheadedness, or edema.  Husband is at the bedside.  Vital Signs .    Vitals:   07/31/23 2344 08/01/23 0342 08/01/23 0955 08/01/23 1133  BP: (!) 150/60 (!) 140/77 (!) 120/59 120/61  Pulse: 66 (!) 56 64 (!) 56  Resp: 19 17 16 16   Temp: 97.8 F (36.6 C) 97.7 F (36.5 C) 97.8 F (36.6 C) (!) 97.5 F (36.4 C)  TempSrc:      SpO2: 98% 98% 99% 98%  Weight:      Height:        Intake/Output Summary (Last 24 hours) at 08/01/2023 1233 Last data filed at 08/01/2023 0400 Gross per 24 hour  Intake 334.95 ml  Output --  Net 334.95 ml      07/30/2023    1:40 PM 07/26/2023    3:42 PM 07/23/2023    1:22 PM  Last 3 Weights  Weight (lbs) 194 lb 10.7 oz 197 lb 9.6 oz 196 lb  Weight (kg) 88.3 kg 89.631 kg 88.905 kg      Telemetry/ECG    Normal sinus rhythm- Personally Reviewed  Physical Exam .   GEN: No acute distress.   Neck: No JVD Cardiac: RRR, no murmurs, rubs, or gallops.  Respiratory: Crackles in both bilateral lower lung fields. GI: Soft, nonter, non-disted  MS: No edema  Assessment & Plan .     Coronary artery disease and elevated troponin: Troponin elevation this admission most consistent with supply-demand mismatch in the setting of fixed coronary artery disease and recurrent pneumonia.  Given lack of angina, modest troponin elevation that peaked shortly after admission, and comorbidities,, no plans for ischemia evaluation at this time.  We will complete 48 hours of IV heparin (to  this evening) and resume clopidogrel tomorrow.  Favor continuation of long-term aspirin and clopidogrel as hemoglobin tolerates.  In the setting of CAD and PAD, could consider adding low-dose rivaroxaban in the future in place of aspirin or clopidogrel; I will defer this  to follow-up with Dr. Kirke Corin.  Continue Vascepa; resume evolocumab after discharge.  Patient is not a candidate for cardiac rehab due to her prior stroke with residual left hemiplegia.  HCAP, sepsis, and acute on chronic respiratory failure with hypoxia: Breathing improving with antimicrobial therapy.  Ongoing management per primary team.  For questions or updates, please contact [facility] Please consult [email or web address] for contact info under [facility].  Signed, Yvonne Kall, MD

## 2023-08-01 NOTE — Consult Note (Signed)
ANTICOAGULATION CONSULT NOTE   Pharmacy Consult for Heparin Infusion Indication: chest pain/ACS  Patient Measurements: Height: 5\' 7"  (170.2 cm) Weight: 88.3 kg (194 lb 10.7 oz) IBW/kg (Calculated) : 61.6 Heparin Dosing Weight: 80.4 kg  Labs: Recent Labs    07/30/23 0838 07/30/23 0912 07/30/23 1110 07/30/23 1130 07/30/23 1711 07/30/23 1821 07/31/23 0304 07/31/23 1002 08/01/23 0031 08/01/23 0523 08/01/23 0807 08/01/23 1359  HGB 9.1*  --   --   --   --   --  8.5*  --   --  9.8*  --   --   HCT 31.2*  --   --   --   --   --  28.2*  --   --  33.9*  --   --   PLT 374  --   --   --   --   --  368  --   --  463*  --   --   APTT  --  SPECIMEN CLOTTED 41*  --   --   --   --   --   --   --   --   --   LABPROT  --  SPECIMEN CLOTTED 16.0*  --   --   --   --   --   --   --   --   --   INR  --  SPECIMEN CLOTTED 1.3*  --   --   --   --   --   --   --   --   --   HEPARINUNFRC  --   --   --   --   --   --  0.11*   < > 0.26*  --  0.50 0.45  CREATININE 0.98 0.88  --   --   --   --   --   --   --  0.73  --   --   TROPONINIHS 136*  --   --  450* 991* 918*  --   --   --   --   --   --    < > = values in this interval not displayed.    Estimated Creatinine Clearance: 97.1 mL/min (by C-G formula based on SCr of 0.73 mg/dL).   Medical History: Past Medical History:  Diagnosis Date   Acute respiratory failure with hypoxia (HCC) 06/24/2021   Arthralgia of temporomandibular joint    CAD, multiple vessel    a. 06/2016 Cath: ostLM 40%, ostLAD 40%, pLAD 95%, ost-pLCx 60%, pLCx 95%, mLCx 60%, mRCA 95%, D2 50%, LVSF nl;  b. 07/2016 CABG x 4 (LIMA->LAD, VG->Diag, VG->OM, VG->RCA); c. 08/2016 Cath: 3VD w/ 4/4 patent grafts. LAD distal to LIMA has diff dzs->Med rx; d. 08/2020 Cath: 4/4 patent grafts, native 3VD. EF 55-65%-->Med Rx.   Carotid arterial disease (HCC)    a. 07/2016 s/p R CEA; b. 02/2021 U/S: RICA 40-59%, LICA 1-39%.   Cerebrovascular disease    Clotting disorder (HCC)    Complex sleep apnea  syndrome 10/03/2022   Depression    Diastolic dysfunction    a. 06/2016 Echo: EF 50-55%, mild inf wall HK, GR1DD, mild MR, RV sys fxn nl, mildly dilated LA, PASP nl; b. 06/2021 Echo: EF 60-65%, no rwma. Nl RV fxn.   Essential hypertension    Essential hypertension, malignant 06/28/2016   Fatty liver disease, nonalcoholic 2016   History of blood transfusion    with heart surgery   History of MI (myocardial  infarction) (July 2017) 08/02/2020   HLD (hyperlipidemia)    HTN (hypertension), malignant 10/20/2013   Labile hypertension    a. prior renal ngiogram negative for RAS in 03/2016; b. catecholamines and metanephrines normal, mildly elevated renin with normal aldosterone and normal ratio in 02/2016   Mild tobacco abuse in early remission 06/28/2016   Myocardial infarction [facility]) 2017   Obesity    PAD (peripheral artery disease) (HCC)    a. 09/2018 s/p L SFA stenting; b. 07/2019 Periph Angio: Patent m/d L SFA stent w/ 100% L SFA distal to stent. L AT 100d, L Peroneal diff dzs-->Med Rx; c. 02/2021 ABIs: stable @ 0.61 on R and 0.46 on L.   PTSD (post-traumatic stress disorder)    Reflux gastritis 07/01/2023   Stroke (HCC)    Tobacco abuse    Type 2 diabetes mellitus (HCC) 12/2015    Medications:  No AC prior to admission. Last dose of Plavix 07/29/2023  Assessment: Patient with hx of CAD s/p CABG in setting of NSTEMI, PAD, labile hypertension, chronic HFpEF, mixed hyperlipidemia, and type 2 diabetes mellitus. Admitted with SOB and elevated troponin. Pharmacy consulted to manage heparin infusion for ACS.  Baseline PLT WNL. H?H low but improved in last 2 weeks. No S/Sx of bleeding noted per chart review  Goal of Therapy:  Heparin level 0.3-0.7 units/ml Monitor platelets by anticoagulation protocol: Yes  0807 0304 HL 0.11, subtherapeutic; 1000 un/hr 0807 1002 HL 0.11, subtherapeutic; 1250 un/hr 0807 1815 HL 0.32, Therapeutic; 1500 un/hr 0808 0031 HL 0.26, subtherapeutic 1500 un/hr 0807 0807  HL 0.5, therapeutic.    Plan:  Heparin level is therapeutic. Will continue heparin infusion at 1700 units/hr.  Plan for 48 hours of heparin.   Paschal Dopp, PharmD, 08/01/2023 3:19 PM

## 2023-08-01 NOTE — Plan of Care (Signed)
°  Problem: Education: Goal: Knowledge of General Education information will improve Description: Including pain rating scale, medication(s)/side effects and non-pharmacologic comfort measures Outcome: Progressing   Problem: Clinical Measurements: Goal: Will remain free from infection Outcome: Progressing   Problem: Clinical Measurements: Goal: Respiratory complications will improve Outcome: Progressing   Problem: Clinical Measurements: Goal: Cardiovascular complication will be avoided Outcome: Progressing   Problem: Elimination: Goal: Will not experience complications related to urinary retention Outcome: Progressing   Problem: Pain Managment: Goal: General experience of comfort will improve Outcome: Progressing   Problem: Safety: Goal: Ability to remain free from injury will improve Outcome: Progressing

## 2023-08-01 NOTE — TOC Benefit Eligibility Note (Signed)
Patient Product/process development scientist completed.    The patient is insured through [facility] . Patient has [employer], may use a copay card, and/or apply for patient assistance if available.    Ran test claim for Stiolto i and the current 30 day co-pay is $0.00.   This test claim was processed through [facility]- copay amounts may vary at other pharmacies due to pharmacy/plan contracts, or as the patient moves through the different stages of their insurance plan.     Roland Earl, CPHT Pharmacy Patient Advocate Specialist [facility] Patient Advocate Team Direct Number: [phone]  Fax: [phone]

## 2023-08-01 NOTE — Consult Note (Signed)
ANTICOAGULATION CONSULT NOTE   Pharmacy Consult for Heparin Infusion Indication: chest pain/ACS  Patient Measurements: Height: 5\' 7"  (170.2 cm) Weight: 88.3 kg (194 lb 10.7 oz) IBW/kg (Calculated) : 61.6 Heparin Dosing Weight: 80.4 kg  Labs: Recent Labs    07/30/23 0838 07/30/23 0912 07/30/23 1110 07/30/23 1130 07/30/23 1711 07/30/23 1821 07/31/23 0304 07/31/23 0304 07/31/23 1002 07/31/23 1815 08/01/23 0031  HGB 9.1*  --   --   --   --   --  8.5*  --   --   --   --   HCT 31.2*  --   --   --   --   --  28.2*  --   --   --   --   PLT 374  --   --   --   --   --  368  --   --   --   --   APTT  --  SPECIMEN CLOTTED 41*  --   --   --   --   --   --   --   --   LABPROT  --  SPECIMEN CLOTTED 16.0*  --   --   --   --   --   --   --   --   INR  --  SPECIMEN CLOTTED 1.3*  --   --   --   --   --   --   --   --   HEPARINUNFRC  --   --   --   --   --   --  0.11*   < > 0.11* 0.32 0.26*  CREATININE 0.98 0.88  --   --   --   --   --   --   --   --   --   TROPONINIHS 136*  --   --  450* 991* 918*  --   --   --   --   --    < > = values in this interval not displayed.    Estimated Creatinine Clearance: 88.3 mL/min (by C-G formula based on SCr of 0.88 mg/dL).   Medical History: Past Medical History:  Diagnosis Date   Acute respiratory failure with hypoxia (HCC) 06/24/2021   Arthralgia of temporomandibular joint    CAD, multiple vessel    a. 06/2016 Cath: ostLM 40%, ostLAD 40%, pLAD 95%, ost-pLCx 60%, pLCx 95%, mLCx 60%, mRCA 95%, D2 50%, LVSF nl;  b. 07/2016 CABG x 4 (LIMA->LAD, VG->Diag, VG->OM, VG->RCA); c. 08/2016 Cath: 3VD w/ 4/4 patent grafts. LAD distal to LIMA has diff dzs->Med rx; d. 08/2020 Cath: 4/4 patent grafts, native 3VD. EF 55-65%-->Med Rx.   Carotid arterial disease (HCC)    a. 07/2016 s/p R CEA; b. 02/2021 U/S: RICA 40-59%, LICA 1-39%.   Cerebrovascular disease    Clotting disorder (HCC)    Complex sleep apnea syndrome 10/03/2022   Depression    Diastolic dysfunction     a. 06/2016 Echo: EF 50-55%, mild inf wall HK, GR1DD, mild MR, RV sys fxn nl, mildly dilated LA, PASP nl; b. 06/2021 Echo: EF 60-65%, no rwma. Nl RV fxn.   Essential hypertension    Essential hypertension, malignant 06/28/2016   Fatty liver disease, nonalcoholic 2016   History of blood transfusion    with heart surgery   History of MI (myocardial infarction) (July 2017) 08/02/2020   HLD (hyperlipidemia)    HTN (hypertension), malignant 10/20/2013   Labile hypertension  a. prior renal ngiogram negative for RAS in 03/2016; b. catecholamines and metanephrines normal, mildly elevated renin with normal aldosterone and normal ratio in 02/2016   Mild tobacco abuse in early remission 06/28/2016   Myocardial infarction [facility]) 2017   Obesity    PAD (peripheral artery disease) (HCC)    a. 09/2018 s/p L SFA stenting; b. 07/2019 Periph Angio: Patent m/d L SFA stent w/ 100% L SFA distal to stent. L AT 100d, L Peroneal diff dzs-->Med Rx; c. 02/2021 ABIs: stable @ 0.61 on R and 0.46 on L.   PTSD (post-traumatic stress disorder)    Reflux gastritis 07/01/2023   Stroke (HCC)    Tobacco abuse    Type 2 diabetes mellitus (HCC) 12/2015    Medications:  No AC prior to admission. Last dose of Plavix 07/29/2023  Assessment: Patient with hx of CAD s/p CABG in setting of NSTEMI, PAD, labile hypertension, chronic HFpEF, mixed hyperlipidemia, and type 2 diabetes mellitus. Admitted with SOB and elevated troponin. Pharmacy consulted to manage heparin infusion for ACS.  Baseline PLT WNL. H?H low but improved in last 2 weeks. No S/Sx of bleeding noted per chart review  Goal of Therapy:  Heparin level 0.3-0.7 units/ml Monitor platelets by anticoagulation protocol: Yes  0807 0304 HL 0.11, subtherapeutic; 1000 un/hr 0807 1002 HL 0.11, subtherapeutic; 1250 un/hr 0807 1815 HL 0.32, Therapeutic; 1500 un/hr 0808 0031 HL 0.26, subtherapeutic 1500 un/hr  Plan:  Bolus 1200 units x 1 Increase infusion rate to 1700  units/hr Will recheck HL in 6 hr after rate change CBC daily while on heparin per protocol   Otelia Sergeant, PharmD, [facility] 08/01/2023 1:04 AM

## 2023-08-01 NOTE — Progress Notes (Signed)
Daily Progress Note   Patient Name: Erika [name]       Date: 08/01/2023 DOB: 02-Jan-1971  Age: 52 y.o. MRN#: [id number] Attending Physician: Sunnie Nielsen, DO Primary Care Physician: Eden Emms, NP Admit Date: 07/30/2023  Reason for Consultation/Follow-up: Establishing goals of care  Subjective: Notes and labs reviewed.  Into see patient.  She is currently resting in bed with husband at bedside.  She denies complaint at this time.   She states she does not like the nectar thick liquids, and will not be thickening her [facility].  Husband stepped out.  We discussed SLP recommendations, and she is aware of consequences of possible aspiration pneumonia.  We discussed her previous admissions for aspiration pneumonia.  Discussed the impact that a pneumonia can have on the body.    We discussed a continued aggressive path versus a comfort focused path.  Discussed hospice at home, and that she would be a candidate to have hospice follow at home if she chooses this route.  She states she will consider this more and talk to her husband about it; she states if she chooses hospice she will let staff know.  Attending in to bedside to discuss care further, and was updated on our conversation.  PMT will shadow.  Scheduled Meds:   amitriptyline  10 mg Oral QHS   arformoterol  15 mcg Nebulization BID   budesonide (PULMICORT) nebulizer solution  0.5 mg Nebulization BID   Chlorhexidine Gluconate Cloth  6 each Topical Daily   chlorproMAZINE  75 mg Oral QHS   clonazepam  0.25 mg Oral BID   [START ON 08/02/2023] clopidogrel  75 mg Oral Daily   cyclobenzaprine  10 mg Oral QHS   escitalopram  20 mg Oral Daily   feeding supplement (NEPRO CARB STEADY)  237 mL Oral TID BM   ferrous sulfate  325 mg  Oral BID WC   fludrocortisone  0.1 mg Oral Daily   icosapent Ethyl  2 g Oral BID   lamoTRIgine  225 mg Oral QHS   methylPREDNISolone (SOLU-MEDROL) injection  80 mg Intravenous Daily   metoprolol succinate  25 mg Oral Daily   pantoprazole  40 mg Oral Daily   pregabalin  225 mg Oral BID   revefenacin  175 mcg Nebulization Daily   topiramate  25  mg Oral QHS    Continuous Infusions:  ceFEPime (MAXIPIME) IV 2 g (08/01/23 0602)   heparin 1,700 Units/hr (08/01/23 0348)    PRN Meds: acetaminophen, albuterol, lip balm, oxyCODONE, polyethylene glycol  Physical Exam Pulmonary:     Effort: Pulmonary effort is normal.  Neurological:     Mental Status: She is alert.             Vital Signs: BP (!) 120/59 (BP Location: Right Arm)   Pulse 64   Temp 97.8 F (36.6 C)   Resp 16   Ht 5\' 7"  (1.702 m)   Wt 88.3 kg   SpO2 99%   BMI 30.49 kg/m  SpO2: SpO2: 99 % O2 Device: O2 Device: Nasal Cannula O2 Flow Rate: O2 Flow Rate (L/min): 5 L/min  Intake/output summary:  Intake/Output Summary (Last 24 hours) at 08/01/2023 1131 Last data filed at 08/01/2023 0400 Gross per 24 hour  Intake 334.95 ml  Output --  Net 334.95 ml   LBM: Last BM Date :  (PTA) Baseline Weight: Weight: 88.3 kg Most recent weight: Weight: 88.3 kg    Patient Active Problem List   Diagnosis Date Noted   Respiratory failure (HCC) 07/30/2023   Hospital discharge follow-up 07/26/2023   COPD with acute exacerbation (HCC) 07/18/2023   Chronic hypotension 07/17/2023   Chronic hypoxemic respiratory failure (HCC) 07/17/2023   Hypokalemia 07/17/2023   Iron deficiency anemia 07/17/2023   Syncope and collapse 07/16/2023   Infestation by bed bug 07/16/2023   Aspiration pneumonia of both lungs (HCC) 07/12/2023   Anemia 07/12/2023   Orthostatic hypotension 07/12/2023   COPD with chronic bronchitis and emphysema (HCC) 07/01/2023   HCAP (healthcare-associated pneumonia) 06/13/2023   Hypoxia 06/13/2023   AKI (acute kidney  injury) (HCC) 06/12/2023   SOB (shortness of breath) 05/27/2023   Adventitious breath sounds 03/05/2023   Complex sleep apnea syndrome 10/03/2022   Scalp psoriasis 06/11/2022   Coccyx pain 06/11/2022   History of tobacco abuse 05/25/2022   Dermatitis 03/19/2022   Mild vascular dementia without behavioral disturbance, psychotic disturbance, mood disturbance, or anxiety (HCC) 03/01/2022   Acute metabolic encephalopathy/delirium/polypharmacy 01/31/2022   Demand ischemia    Pressure injury of skin 01/27/2022   Cardiomegaly    Polypharmacy    COVID-19 virus infection 12/22/2021   Hyperglycemia 12/22/2021   Major depressive disorder, recurrent episode, mild (HCC) 11/26/2021   Ankle fracture, left    Closed avulsion fracture of medial malleolus of left tibia    Cerebrovascular accident (CVA) due to occlusion of vertebral artery (HCC) 09/11/2021   Cognitive change 09/11/2021   Cognitive communication deficit 08/10/2021   Urinary incontinence 08/10/2021   GERD (gastroesophageal reflux disease) 08/03/2021   Chronic post-traumatic stress disorder (PTSD)    Depression    Right pontine stroke (HCC) 06/28/2021   Hemiplegia and hemiparesis following cerebral infarction affecting left non-dominant side (HCC) 06/24/2021   Chronic diastolic CHF (congestive heart failure) (HCC) 06/24/2021   Fall 06/24/2021   Abnormal LFTs 06/24/2021   Olecranon bursitis of left elbow 06/12/2021   Chronic hip pain (Bilateral) 06/09/2021   Chronic use of opiate for therapeutic purpose 05/09/2021   Greater trochanteric bursitis of hip (Left) 03/09/2021   Bursitis of hip (Left) 02/23/2021   Uncomplicated opioid dependence (HCC) 02/20/2021   Acute conjunctivitis of left eye 01/02/2021   Unintentional weight loss 11/21/2020   Broken teeth (Right) 08/02/2020   Neuropathy 06/09/2020   Dental abscess 06/09/2020   Foot drop (Left) 06/09/2020   Carotid stenosis,  asymptomatic, right 04/27/2020   Migraine, chronic,  without aura 11/03/2019   Reactive thrombocytosis 09/16/2019   Erythrocytosis 09/16/2019   Hypercalcemia 09/06/2019   Leukocytosis 09/06/2019   Polycythemia 09/06/2019   Chronic hip pain (Left) 08/27/2019   Osteoarthritis of hip (Left) 08/27/2019   Gluteal tendonitis of buttock (Left) 08/27/2019   Radial nerve palsy (Right) 07/15/2019   Neuropathy of radial nerve (Right) 06/30/2019   Abnormal bruising 06/25/2019   History of carotid endarterectomy (Right) 03/04/2019   Pain medication agreement signed 03/04/2019   Atypical facial pain (Right) 01/27/2019   Chronic ear pain (Right) 01/27/2019   Chronic jaw pain (Right) 01/27/2019   Geniculate Neuralgia (Right) 01/27/2019   Vitamin D deficiency 01/19/2019   Neurogenic pain 01/19/2019   Chronic anticoagulation (PLAVIX) 01/19/2019   Chronic pain syndrome 01/07/2019   Long term current use of opiate analgesic 01/07/2019   Long term prescription benzodiazepine use 01/07/2019   Pharmacologic therapy 01/07/2019   Disorder of skeletal system 01/07/2019   Problems influencing health status 01/07/2019   Chronic headaches (1ry area of Pain) (Right) 01/07/2019   Opiate use 09/24/2018   PVD (peripheral vascular disease) (HCC) 06/24/2018   Chronic ankle pain (Bilateral) 12/27/2017   Tendinopathy of gluteus medius (Right) 12/27/2017   Tendinopathy of gluteus medius (Left) 12/27/2017   Bilateral hip pain 12/26/2017   Chronic elbow pain (Left) 10/17/2017   Elevated troponin I level 05/21/2017   Severe sepsis (HCC) 05/19/2017   Major depressive disorder, recurrent episode, moderate (HCC) 11/19/2016   Insomnia 10/30/2016   Constipation 07/25/2016   S/P CABG x 4 07/06/2016   Bradycardia    Carotid stenosis    Elevated troponin 06/28/2016   Malignant hypertension    Type 2 diabetes mellitus with other specified complication (HCC)    Chest pain with high risk for cardiac etiology 06/27/2016   NSTEMI (non-ST elevated myocardial infarction) (HCC)  06/27/2016   Proteinuria 03/15/2016   Renal artery stenosis (HCC) 03/15/2016   MDD (major depressive disorder) 10/17/2015   Agoraphobia with panic attacks 04/25/2015   Abnormal CBC 04/13/2013   Debility 04/02/2013   Cluster headache 03/20/2012   HLD (hyperlipidemia) 07/26/2010    Palliative Care Assessment & Plan    Recommendations/Plan: DNR/DNI.  Patient would be a candidate for hospice at home if she chooses this route. PMT will shadow.  Code Status:    Code Status Orders  (From admission, onward)           Start     Ordered   07/31/23 1552  Do not attempt resuscitation (DNR)  Continuous       Question Answer Comment  If patient has no pulse and is not breathing Do Not Attempt Resuscitation   If patient has a pulse and/or is breathing: Medical Treatment Goals LIMITED ADDITIONAL INTERVENTIONS: Use medication/IV fluids and cardiac monitoring as indicated; Do not use intubation or mechanical ventilation (DNI), also provide comfort medications.  Transfer to Progressive/Stepdown as indicated, avoid Intensive Care.   Consent: Discussion documented in EHR or advanced directives reviewed      07/31/23 1551           Code Status History     Date Active Date Inactive Code Status Order ID Comments User Context   07/16/2023 2219 07/19/2023 1615 Full Code [id number]  Gertha Calkin, MD ED   07/03/2023 1349 07/05/2023 1804 Full Code [id number]  Floydene Flock, MD ED   06/12/2023 1531 06/14/2023 1746 DNR [id number]  Verdene Lennert, MD ED  06/12/2023 1229 06/12/2023 1531 DNR [id number]  Corena Herter, MD ED   01/26/2022 1717 02/01/2022 2214 DNR [id number]  Venora Maples, MD ED   01/26/2022 1717 01/26/2022 1717 DNR [id number]  Venora Maples, MD ED   12/22/2021 0411 12/24/2021 1912 Full Code [id number]  Frankey Shown, DO ED   11/27/2021 1433 12/08/2021 1601 Full Code [id number]  Charlton Amor, PA-C Inpatient   11/27/2021 1433 11/27/2021 1433 Full Code [id number]  Charlton Amor, PA-C  Inpatient   11/21/2021 1614 11/27/2021 1359 Full Code [id number]  Mansy, Vernetta Honey, MD ED   06/28/2021 1321 08/01/2021 1652 Full Code [id number]  Jacquelynn Cree, PA-C Inpatient   06/24/2021 1018 06/28/2021 1300 Full Code [id number]  Lorretta Harp, MD ED   08/24/2020 1222 08/24/2020 2038 Full Code [id number]  Iran Ouch, MD Inpatient   04/27/2020 1326 04/28/2020 1549 Full Code [id number]  Milinda Antis, PA-C Inpatient   08/19/2019 1328 08/19/2019 2013 Full Code [id number]  Iran Ouch, MD Inpatient   10/15/2018 1305 10/15/2018 2203 Full Code [id number]  Iran Ouch, MD Inpatient   05/19/2017 1600 05/20/2017 1859 Full Code [id number]  Catarina Hartshorn, MD Inpatient   08/28/2016 2238 08/30/2016 1542 Full Code [id number]  Hillary Bow, DO ED   06/29/2016 1214 07/06/2016 1647 Full Code [id number]  Ellsworth Lennox, PA Inpatient   06/27/2016 1641 06/29/2016 1214 Full Code [id number]  Hower, Cletis Athens, MD ED       Prognosis: Poor overall    Care plan was discussed with attending at bedside  Thank you for allowing the Palliative Medicine Team to assist in the care of this patient.   Morton Stall, NP  Please contact Palliative Medicine Team phone at [phone] for questions and concerns.

## 2023-08-01 NOTE — Progress Notes (Signed)
PROGRESS NOTE    Erika [name]   ZOX:[id number] DOB: 1971-04-21  DOA: 07/30/2023 Date of Service: 08/01/23 PCP: Eden Emms, NP     Brief Narrative / Hospital Course:  Erika [name] is 52 yo female who has a PMHx of chronic respiratory failure on 4L home O2 via [place], HTN, HLD, CABG & Endarterectomy s/p MI (2017), CVA w/ left-sided deficit, obesity, OSA, PAD, PNA, PTSD, Reflux gastritis (06/2023), T2DM who presents to [facility] ED for SOB and AMS while being treated for bilat PNA with Augmentin and Prednisone taper following discharge from [facility] on 07/26. Per patient's husband (former paramedic) he found her at home w/ BiPap and O2 off and SpO2 30%, he increased O2 to 10L but SpO2 only improved to 67% and noted AMS. At home, patient is normally on 4L home O2 via [place] and Bipap at night. Patient normally walks with a hemi walker due to left-sided deficits s/p CVA.  08/06: to ED. Vanc + cefepime for HAP coverage, hypotension requiring pressors, to ICU. CT chest "Multi lobar and dependent bilateral pneumonia, progressed since 07/16/2023 CTA. Consider sequelae of aspiration in this clinical setting." Elevated troponin, started heparin gtt, cardiology consult  08/07: hospitalist assumes care. Continue heparin x48h total then can restart plavix and d/c ASA.  08/08: palliative care s/o, consider hospice depending on pt decision re: thin vs thick liquids, follow outpatient. Cardiology keep plan to manage conservatively, resume clopidogrel tomorrow.   Consultants:  Cardiology   Procedures: none      ASSESSMENT & PLAN:   Principal Problem:   Respiratory failure (HCC) Active Problems:   SOB (shortness of breath)   HCAP (healthcare-associated pneumonia)  Acute on chronic Respiratory Failure likely d/t aspiration pneumonia  Neg COVID, neg MRSA screen Supplemental O2 via [place] as needed to maintain O2 sats 90% Chest X-ray & ABG as needed Bronchodilators & Pulmicort nebs IV Steroids IV  fluids  Pulmonary toilet as able Continue antibiotics empiric Vancomycin & Cefepime, pending cultures & sensitivities  Increase level of respiratory management if patient's status decompensates   Septic Shock, Source: Worsening PNA vs ?HAP Required pressors, now off  Continue telemetry  Continue frequent VS  Maintain MAP >55 Trend lactic acid to normal  Trend WBC Trend procalcitonin  Treat pneumonia as above   HFpEF Echo (07/17/23): LVEF 60-65%. Mild LV hypertrophy. LV diastolic parameters consistent w/ Grade I diastolic dysfunction. Diuresis as BP and renal function permits  Elevated troponin -NSTEMI Type II MI d/t likely demand ischemia but progression of her severe underlying CAD cannot be entirely excluded  136->450->991->918.  Cardiology following Continue IV heparin x48h total, low threshold to d/c if bleeding / Hgb drop Resume ASA, restart plavix and d/c ASA once heparin complete If she were to have angina and/or dynamic EKG changes, cardiac catheterization would need to be readdressed  Resume ASA + clopidogrel tomorrow.   Chronic cortical encephalomalacia likely s/p previous CVA Acute Metabolic Encephalopathy d/t sepsis, septic shock, NSTEMI CT Head wo Contrast: No acute intracranial abn. Chronic cortical encephalomalacia in right parietal and occipital lobes, and moderate brainstem atrophy and/or Wallerian degeneration - greater on the right Patient's left-sided deficits chronic Continue monitor mental status - has improved  Treatment of metabolic derangements as outlined above Provide supportive care Promote normal sleep/wake cycle and family presence Avoid sedating medications as able   T2DM Monitor fasting Glc   Renal Monitor I&O's / urinary output Follow BMP Ensure adequate renal perfusion Avoid nephrotoxic agents as able Replace electrolytes as  indicated ~ Pharmacy following for assistance with electrolyte replacement      DVT prophylaxis: heparin gtt  per ACS protocol  Pertinent IV fluids/nutrition: d/c IV fluids  Central lines / invasive devices: none  Code Status: DNR/DNI per palliative care discussion.   ACP documentation reviewed: 07/31/23 advanced directive on file from 2021  Current Admission Status: inpatient  TOC needs / Dispo plan: anticipate d/c home, confrm home health / DME Barriers to discharge / significant pending items: clinical improvement, off heparin gtt this evening, possible d/c tomorrow              Subjective / Brief ROS:  Patient reports feelingokay this morning Denies CP/SOB Pain controlled.  Denies new weakness.  Tolerating diet.  Reports no concerns w/ urination/defecation.   Family Communication: none at this time      Objective Findings:  Vitals:   07/31/23 2344 08/01/23 0342 08/01/23 0955 08/01/23 1133  BP: (!) 150/60 (!) 140/77 (!) 120/59 120/61  Pulse: 66 (!) 56 64 (!) 56  Resp: 19 17 16 16   Temp: 97.8 F (36.6 C) 97.7 F (36.5 C) 97.8 F (36.6 C) (!) 97.5 F (36.4 C)  TempSrc:      SpO2: 98% 98% 99% 98%  Weight:      Height:        Intake/Output Summary (Last 24 hours) at 08/01/2023 1312 Last data filed at 08/01/2023 0400 Gross per 24 hour  Intake 334.95 ml  Output --  Net 334.95 ml   Filed Weights   07/30/23 1340  Weight: 88.3 kg    Examination:  Physical Exam Constitutional:      General: She is not in acute distress.    Appearance: She is obese. She is not ill-appearing.  Cardiovascular:     Rate and Rhythm: Normal rate and regular rhythm.  Pulmonary:     Effort: Pulmonary effort is normal.     Breath sounds: Normal breath sounds.  Musculoskeletal:     Right lower leg: No edema.     Left lower leg: No edema.  Skin:    General: Skin is warm and dry.  Neurological:     Mental Status: She is alert and oriented to person, place, and time.  Psychiatric:        Mood and Affect: Mood normal.        Behavior: Behavior normal.          Scheduled  Medications:   amitriptyline  10 mg Oral QHS   arformoterol  15 mcg Nebulization BID   budesonide (PULMICORT) nebulizer solution  0.5 mg Nebulization BID   Chlorhexidine Gluconate Cloth  6 each Topical Daily   chlorproMAZINE  75 mg Oral QHS   clonazepam  0.25 mg Oral BID   [START ON 08/02/2023] clopidogrel  75 mg Oral Daily   cyclobenzaprine  10 mg Oral QHS   escitalopram  20 mg Oral Daily   feeding supplement (NEPRO CARB STEADY)  237 mL Oral TID BM   ferrous sulfate  325 mg Oral BID WC   fludrocortisone  0.1 mg Oral Daily   icosapent Ethyl  2 g Oral BID   lamoTRIgine  225 mg Oral QHS   methylPREDNISolone (SOLU-MEDROL) injection  80 mg Intravenous Daily   metoprolol succinate  25 mg Oral Daily   pantoprazole  40 mg Oral Daily   potassium chloride  40 mEq Oral Once   pregabalin  225 mg Oral BID   revefenacin  175 mcg Nebulization Daily  topiramate  25 mg Oral QHS    Continuous Infusions:  ceFEPime (MAXIPIME) IV 2 g (08/01/23 0602)   heparin 1,700 Units/hr (08/01/23 0348)    PRN Medications:  acetaminophen, albuterol, lip balm, oxyCODONE, polyethylene glycol  Antimicrobials from admission:  Anti-infectives (From admission, onward)    Start     Dose/Rate Route Frequency Ordered Stop   07/30/23 2200  ceFEPIme (MAXIPIME) 2 g in sodium chloride 0.9 % 100 mL IVPB        2 g 200 mL/hr over 30 Minutes Intravenous Every 8 hours 07/30/23 1313     07/30/23 2200  vancomycin (VANCOCIN) IVPB 1000 mg/200 mL premix  Status:  Discontinued        1,000 mg 200 mL/hr over 60 Minutes Intravenous Every 12 hours 07/30/23 1316 07/31/23 0927   07/30/23 1115  vancomycin (VANCOREADY) IVPB 750 mg/150 mL        750 mg 150 mL/hr over 60 Minutes Intravenous  Once 07/30/23 1008 07/30/23 1324   07/30/23 1015  vancomycin (VANCOCIN) IVPB 1000 mg/200 mL premix        1,000 mg 200 mL/hr over 60 Minutes Intravenous  Once 07/30/23 1006 07/30/23 1211   07/30/23 1015  ceFEPIme (MAXIPIME) 2 g in sodium chloride  0.9 % 100 mL IVPB        2 g 200 mL/hr over 30 Minutes Intravenous  Once 07/30/23 1006 07/30/23 1051           Data Reviewed:  I have personally reviewed the following...  CBC: Recent Labs  Lab 07/26/23 1628 07/30/23 0838 07/31/23 0304 08/01/23 0523  WBC 9.6 21.1* 19.5* 12.0*  HGB 9.5* 9.1* 8.5* 9.8*  HCT 31.1* 31.2* 28.2* 33.9*  MCV 70.5* 74.5* 72.3* 75.0*  PLT 421* 374 368 463*   Basic Metabolic Panel: Recent Labs  Lab 07/26/23 1628 07/30/23 0838 07/30/23 0912 08/01/23 0523  NA 139 135 136 139  K 4.0 4.1 4.4 3.4*  CL 100 97* 96* 102  CO2 32 30 31 27   GLUCOSE 146* 128* 114* 113*  BUN 10 12 12 9   CREATININE 0.74 0.98 0.88 0.73  CALCIUM 9.5 9.2 9.4 9.8   GFR: Estimated Creatinine Clearance: 97.1 mL/min (by C-G formula based on SCr of 0.73 mg/dL). Liver Function Tests: Recent Labs  Lab 07/26/23 1628 07/30/23 0912  AST 20 30  ALT 22 16  ALKPHOS  --  65  BILITOT 0.2 0.9  PROT 6.1 6.5  ALBUMIN  --  3.4*   No results for input(s): "LIPASE", "AMYLASE" in the last 168 hours. No results for input(s): "AMMONIA" in the last 168 hours. Coagulation Profile: Recent Labs  Lab 07/30/23 0912 07/30/23 1110  INR SPECIMEN CLOTTED 1.3*   Cardiac Enzymes: No results for input(s): "CKTOTAL", "CKMB", "CKMBINDEX", "TROPONINI" in the last 168 hours. BNP (last 3 results) No results for input(s): "PROBNP" in the last 8760 hours. HbA1C: No results for input(s): "HGBA1C" in the last 72 hours. CBG: Recent Labs  Lab 07/30/23 1344  GLUCAP 135*   Lipid Profile: No results for input(s): "CHOL", "HDL", "LDLCALC", "TRIG", "CHOLHDL", "LDLDIRECT" in the last 72 hours. Thyroid Function Tests: No results for input(s): "TSH", "T4TOTAL", "FREET4", "T3FREE", "THYROIDAB" in the last 72 hours. Anemia Panel: No results for input(s): "VITAMINB12", "FOLATE", "FERRITIN", "TIBC", "IRON", "RETICCTPCT" in the last 72 hours. Most Recent Urinalysis On File:     Component Value  Date/Time   COLORURINE AMBER (A) 07/30/2023 1021   APPEARANCEUR CLOUDY (A) 07/30/2023 1021   APPEARANCEUR Clear  09/11/2021 1413   LABSPEC 1.023 07/30/2023 1021   LABSPEC 1.024 01/11/2015 1022   PHURINE 6.0 07/30/2023 1021   GLUCOSEU NEGATIVE 07/30/2023 1021   GLUCOSEU >=1000 (A) 10/19/2019 1034   HGBUR NEGATIVE 07/30/2023 1021   BILIRUBINUR NEGATIVE 07/30/2023 1021   BILIRUBINUR Negative 09/11/2021 1413   BILIRUBINUR Negative 01/11/2015 1022   KETONESUR NEGATIVE 07/30/2023 1021   PROTEINUR NEGATIVE 07/30/2023 1021   UROBILINOGEN 1.0 10/19/2019 1049   UROBILINOGEN 1.0 10/19/2019 1034   NITRITE NEGATIVE 07/30/2023 1021   LEUKOCYTESUR NEGATIVE 07/30/2023 1021   LEUKOCYTESUR Negative 01/11/2015 1022   Sepsis Labs: @LABRCNTIP (procalcitonin:4,lacticidven:4) Microbiology: Recent Results (from the past 240 hour(s))  Blood Culture (routine x 2)     Status: None (Preliminary result)   Collection Time: 07/30/23  9:12 AM   Specimen: BLOOD  Result Value Ref Range Status   Specimen Description BLOOD LEFT ANTECUBITAL  Final   Special Requests   Final    BOTTLES DRAWN AEROBIC AND ANAEROBIC Blood Culture adequate volume   Culture   Final    NO GROWTH 2 DAYS Performed at [facility], [address] [place], [place] [address]    Report Status PENDING  Incomplete  Blood Culture (routine x 2)     Status: None (Preliminary result)   Collection Time: 07/30/23  9:12 AM   Specimen: BLOOD  Result Value Ref Range Status   Specimen Description BLOOD RIGHT ANTECUBITAL  Final   Special Requests   Final    BOTTLES DRAWN AEROBIC AND ANAEROBIC Blood Culture adequate volume   Culture   Final    NO GROWTH 2 DAYS Performed at [facility], [address], [place], [place] [address]    Report Status PENDING  Incomplete  SARS Coronavirus 2 by RT PCR (hospital order, performed in [facility] hospital lab) *cepheid single result test* Anterior Nasal Swab     Status: None   Collection  Time: 07/30/23  9:39 AM   Specimen: Anterior Nasal Swab  Result Value Ref Range Status   SARS Coronavirus 2 by RT PCR NEGATIVE NEGATIVE Final    Comment: (NOTE) SARS-CoV-2 target nucleic acids are NOT DETECTED.  The SARS-CoV-2 RNA is generally detectable in upper and lower respiratory specimens during the acute phase of infection. The lowest concentration of SARS-CoV-2 viral copies this assay can detect is 250 copies / mL. A negative result does not preclude SARS-CoV-2 infection and should not be used as the sole basis for treatment or other patient management decisions.  A negative result may occur with improper specimen collection / handling, submission of specimen other than nasopharyngeal swab, presence of viral mutation(s) within the areas targeted by this assay, and inadequate number of viral copies (<250 copies / mL). A negative result must be combined with clinical observations, patient history, and epidemiological information.  Fact Sheet for Patients:   RoadLapTop.co.za  Fact Sheet for Healthcare Providers: http://kim-miller.com/  This test is not yet approved or  cleared by the [place] FDA and has been authorized for detection and/or diagnosis of SARS-CoV-2 by FDA under an Emergency Use Authorization (EUA).  This EUA will remain in effect (meaning this test can be used) for the duration of the COVID-19 declaration under Section 564(b)(1) of the Act, 21 U.S.C. section 360bbb-3(b)(1), unless the authorization is terminated or revoked sooner.  Performed at [facility], [address] [place], [place] [address]   MRSA Next Gen by PCR, Nasal     Status: None   Collection Time:  07/30/23  1:40 PM   Specimen: Nasal Mucosa; Nasal Swab  Result Value Ref Range Status   MRSA by PCR Next Gen NOT DETECTED NOT DETECTED Final    Comment: (NOTE) The GeneXpert MRSA Assay (FDA approved for NASAL specimens only), is one  component of a comprehensive MRSA colonization surveillance program. It is not intended to diagnose MRSA infection nor to guide or monitor treatment for MRSA infections. Test performance is not FDA approved in patients less than 76 years old. Performed at [facility], [address] [place], [place] [address]       Radiology Studies last 3 days: CT Angio Chest Pulmonary Embolism (PE) W or WO Contrast  Result Date: 07/30/2023 CLINICAL DATA:  52 year old female with altered mental status, abnormal breathing, undergoing treatment for pneumonia. History of previous cerebral infarct with left side deficit. EXAM: CT ANGIOGRAPHY CHEST WITH CONTRAST TECHNIQUE: Multidetector CT imaging of the chest was performed using the standard protocol during bolus administration of intravenous contrast. Multiplanar CT image reconstructions and MIPs were obtained to evaluate the vascular anatomy. RADIATION DOSE REDUCTION: This exam was performed according to the departmental dose-optimization program which includes automated exposure control, adjustment of the mA and/or kV according to patient size and/or use of iterative reconstruction technique. CONTRAST:  75mL OMNIPAQUE IOHEXOL 350 MG/ML SOLN COMPARISON:  Portable chest 0918 hours today.  Prior CTA 07/16/2023. Speech pathology evaluation 07/05/2023. FINDINGS: Cardiovascular: Good contrast bolus timing in the pulmonary arterial tree. No pulmonary artery filling defect. Prior CABG. Calcified aortic atherosclerosis. Cardiac size remains within normal limits. No pericardial effusion. Mediastinum/Nodes: Postoperative changes. Reactive appearing mediastinal lymph nodes including right paratracheal, similar to last month. No mediastinal mass or suspicious lymphadenopathy. Lungs/Pleura: Major airways are patent with some atelectatic changes. But there is bilateral lower lobe consolidation with air bronchograms, similar to but progressed since 07/16/2023. No pleural  effusions. And there is similar dependent peribronchial opacity now in the middle lobes, additional patchy and nodular middle lobe bronchovascular involvement, and early mostly dependent peribronchial involvement also in the upper lobes. Upper Abdomen: Negative visible liver, spleen, pancreas, adrenal glands and bowel in the upper abdomen. Musculoskeletal: Sternotomy. No acute osseous abnormality identified. Review of the MIP images confirms the above findings. IMPRESSION: 1. Negative for acute pulmonary embolus. 2. Multi lobar and dependent bilateral pneumonia, progressed since 07/16/2023 CTA. Consider sequelae of aspiration in this clinical setting. 3.  Aortic Atherosclerosis (ICD10-I70.0).  Prior CABG. Electronically Signed   By: Odessa Fleming M.D.   On: 07/30/2023 11:13   CT Head Wo Contrast  Result Date: 07/30/2023 CLINICAL DATA:  52 year old female with altered mental status, abnormal breathing, undergoing treatment for pneumonia. History of previous cerebral infarct with left side deficit. EXAM: CT HEAD WITHOUT CONTRAST TECHNIQUE: Contiguous axial images were obtained from the base of the skull through the vertex without intravenous contrast. RADIATION DOSE REDUCTION: This exam was performed according to the departmental dose-optimization program which includes automated exposure control, adjustment of the mA and/or kV according to patient size and/or use of iterative reconstruction technique. COMPARISON:  Brain MRI 07/16/2023.  Head CT 06/11/2023. FINDINGS: Brain: Right parietal and occipital lobe cortical encephalomalacia, gliosis appears stable. And there is chronic right brainstem atrophy, confirmed on the MRI last month. No superimposed No midline shift, ventriculomegaly, mass effect, evidence of mass lesion, intracranial hemorrhage or evidence of cortically based acute infarction. Gray-white matter differentiation is within normal limits throughout the brain. Vascular: No suspicious intracranial vascular  hyperdensity. Calcified atherosclerosis at the skull base.  Skull: Intact.  No acute osseous abnormality identified. Sinuses/Orbits: Visualized paranasal sinuses and mastoids are stable and well aerated. Other: Visualized orbits and scalp soft tissues are within normal limits. IMPRESSION: 1. No acute intracranial abnormality. 2. Chronic cortical encephalomalacia in the right parietal and occipital lobes, and moderate brainstem atrophy and/or Wallerian degeneration - greater on the right. Electronically Signed   By: Odessa Fleming M.D.   On: 07/30/2023 10:59   DG Chest Port 1 View  Result Date: 07/30/2023 CLINICAL DATA:  Abnormal breathing EXAM: PORTABLE CHEST 1 VIEW COMPARISON:  CXR 07/16/23 FINDINGS: Status post median sternotomy and CABG. Unchanged cardiac and mediastinal contours. No large pleural effusion. No pneumothorax. There are prominent bilateral interstitial opacities there are somewhat basal predominant. Findings could represent pulmonary edema or multifocal infection. No radiographically apparent displaced rib fractures. Visualized upper abdomen is unremarkable. IMPRESSION: Prominent bilateral interstitial opacities, which are somewhat basal predominant. Findings could represent pulmonary edema or multifocal infection. Electronically Signed   By: Lorenza [place] M.D.   On: 07/30/2023 09:29             LOS: 2 days        Sunnie Nielsen, DO [facility] 08/01/2023, 1:12 PM    Dictation software may have been used to generate the above note. Typos may occur and escape review in typed/dictated notes. Please contact Dr Lyn Hollingshead directly for clarity if needed.  Staff may message me via secure chat in Epic  but this may not receive an immediate response,  please page me for urgent matters!  If 7PM-7AM, please contact night coverage [email or web address]

## 2023-08-02 DIAGNOSIS — J986 Disorders of diaphragm: Secondary | ICD-10-CM | POA: Insufficient documentation

## 2023-08-02 DIAGNOSIS — J9621 Acute and chronic respiratory failure with hypoxia: Secondary | ICD-10-CM | POA: Diagnosis not present

## 2023-08-02 MED ORDER — ASPIRIN 81 MG PO CHEW: 81.0000 mg | CHEWABLE_TABLET | Freq: Every day | ORAL | Status: DC

## 2023-08-02 MED ORDER — NEPRO/CARBSTEADY PO LIQD: 237.0000 mL | Freq: Three times a day (TID) | ORAL | Status: DC

## 2023-08-02 MED ORDER — ASPIRIN 81 MG PO CHEW: 81.0000 mg | CHEWABLE_TABLET | Freq: Every day | ORAL | 0 refills | Status: DC

## 2023-08-02 MED ORDER — STIOLTO RESPIMAT 2.5-2.5 MCG/ACT IN AERS: 2.0000 | INHALATION_SPRAY | Freq: Every day | RESPIRATORY_TRACT | 0 refills | Status: DC

## 2023-08-02 NOTE — Consult Note (Signed)
Pharmacy Antibiotic Note  Erika [name] is a 52 y.o. female with PMH including DM, CAD s/p CABG, carotid stenosis, PVD, chronic pain, CVA, depression, COPD, recent hospitalization 7/23 - 7/26 for orthostatic hypotension and aspiration pneumonia treated with ceftriaxone and discharged on Augmentin admitted on 07/30/2023 with pneumonia.  Pharmacy has been consulted for cefepime and vancomycin dosing.  Plan: Day 4 of abx. Continue cefepime 2 g IV q8h. Recommend 5-7 days of abx.     Temp (24hrs), Avg:98 F (36.7 C), Min:97.5 F (36.4 C), Max:98.3 F (36.8 C)  Recent Labs  Lab 07/26/23 1628 07/30/23 0838 07/30/23 0912 07/30/23 0913 07/30/23 0941 07/31/23 0304 08/01/23 0523  WBC 9.6 21.1*  --   --   --  19.5* 12.0*  CREATININE 0.74 0.98 0.88  --   --   --  0.73  LATICACIDVEN  --   --   --  1.3 1.0  --   --     Estimated Creatinine Clearance: 97.1 mL/min (by C-G formula based on SCr of 0.73 mg/dL).    No Known Allergies  Antimicrobials this admission: Cefepime 8/6 >>  Vancomycin 8/6 >>   Dose adjustments this admission: [name]  Microbiology results: 8/6 BCx: NGTD 8/6 MRSA PCR: negative.   Thank you for allowing pharmacy to be a part of this patient's care.  Ronnald Ramp, PharmD, BCPS 08/02/2023 8:46 AM

## 2023-08-02 NOTE — Care Management Important Message (Signed)
Important Message  Patient Details  Name: Erika [name] MRN: [id number] Date of Birth: 20-May-1971   Medicare Important Message Given:  [name] - LOS <3 / Initial given by admissions     Olegario Messier A  08/02/2023, 11:14 AM

## 2023-08-02 NOTE — Consult Note (Signed)
   [facility] Inpatient Consult   08/02/2023  Erika [name] 1971-05-10 [id number]  Primary Care Provider:  Toney Reil, NP [facility] [place]).  Patient is currently active with Care Management for chronic disease management services.  Patient has been engaged by a  care coordinator.  Our community based plan of care has focused on disease management and community resource support.   Patient will receive a post hospital call and will be evaluated for assessments and disease process education.   Plan: Discharge home with [facility] today.  Inpatient Transition Of Care [TOC] team member to make aware that Care Management following.  Of note, Care Management services does not replace or interfere with any services that are needed or arranged by inpatient [facility] care management team.   For additional questions or referrals please contact:  [name], RN, [facility] Liaison [place]   Population Health Office Hours MTWF  8:00 am-6:00 pm Off on Thursday [phone] mobile [phone] [Office toll free line] Office Hours are M-F 8:30 - 5 pm [email or web address]

## 2023-08-02 NOTE — Discharge Summary (Addendum)
Physician Discharge Summary   Patient: Erika [name] MRN: [id number]  DOB: 1971/05/16   Admit:     Date of Admission: 07/30/2023 Admitted from: home w/ home health   Discharge: Date of discharge: 08/02/23 Disposition: Home health Condition at discharge: fair  CODE STATUS: DNR     Discharge Physician: Sunnie Nielsen, DO [facility]     PCP: Eden Emms, NP  Recommendations for Outpatient Follow-up:  Follow up with PCP Eden Emms, NP in 1-2 weeks Please obtain labs/tests: CBC, BMP, consider CXR Please follow up on the following pending results: none PCP AND OTHER OUTPATIENT PROVIDERS: SEE BELOW FOR SPECIFIC DISCHARGE INSTRUCTIONS PRINTED FOR PATIENT IN ADDITION TO GENERIC AVS PATIENT INFO    Discharge Instructions     Diet - low sodium heart healthy   Complete by: As directed    Increase activity slowly   Complete by: As directed    No wound care   Complete by: As directed          Discharge Diagnoses: Principal Problem:   Respiratory failure (HCC) Active Problems:   Elevated troponin   Sepsis (HCC)   SOB (shortness of breath)   HCAP (healthcare-associated pneumonia)   Disorder of diaphragm       Hospital Course: Erika [name] is 52 yo female who has a PMHx of chronic respiratory failure on 4L home O2 via [place], HTN, HLD, CABG & Endarterectomy s/p MI (2017), CVA w/ left-sided deficit, obesity, OSA, PAD, PNA, PTSD, Reflux gastritis (06/2023), T2DM who presents to [facility] ED for SOB and AMS while being treated for bilat PNA with Augmentin and Prednisone taper following discharge from [facility] on 07/26. Per patient's husband (former paramedic) he found her at home w/ BiPap and O2 off and SpO2 30%, he increased O2 to 10L but SpO2 only improved to 67% and noted AMS. At home, patient is normally on 4L home O2 via [place] and Bipap at night. Patient normally walks with a hemi walker due to left-sided deficits s/p CVA.  08/06: to ED. Vanc + cefepime  for HAP coverage, hypotension requiring pressors, to ICU. CT chest "Multi lobar and dependent bilateral pneumonia, progressed since 07/16/2023 CTA. Consider sequelae of aspiration in this clinical setting." Elevated troponin, started heparin gtt, cardiology consult  08/07: hospitalist assumes care. Continue heparin x48h total then can restart plavix and d/c ASA.  08/08: palliative care s/o, consider hospice depending on pt decision re: thin vs thick liquids, follow outpatient. Cardiology keep plan to manage conservatively, resume clopidogrel tomorrow.  08/09: off heparin, on baseline O2 and no SOB, pt feeling well, stable for discharge. Resume HH PT/OT/RN/RT  Consultants:  Cardiology   Procedures: none      ASSESSMENT & PLAN:  Acute on chronic Respiratory Failure w/ disorder of diaphragm likely d/t aspiration pneumonia vs aspiration pneumonitis  w/ associated Severe Sepsis d/t pneumonia vs Severe SIRS d/t pneumonitis  Neg COVID, neg MRSA screen Supplemental O2 --> back to baseline  Bronchodilators, and start Stiolto at home  Pulmonary toilet as able Chest PT vest Rx for home Home health RT to follow at home    Septic Shock, Source: Aspiration PNA vs ?HAP Vs shock d/t respiratory/cardiac compromise from SIRS/resp failure as above Required pressors, now off  BCx negative, MRSA negative, COVID negative  Will avoid abx for now, s/p 3 days cefepime, favor aspiration pneumonitis  HFpEF Echo (07/17/23): LVEF 60-65%. Mild LV hypertrophy. LV diastolic parameters consistent w/ Grade I diastolic dysfunction. Diuresis  as BP and renal function permits Follow cardiology outpatient   Elevated troponin -NSTEMI Type II MI d/t likely demand ischemia but progression of her severe underlying CAD cannot be entirely excluded  136->450->991->918.  S/p IV heparin x48h total Restart plavix and ASA  Follow cardiology outpatient   Chronic cortical encephalomalacia likely s/p previous CVA Acute  Metabolic Encephalopathy d/t sepsis, septic shock, NSTEMI - acute encephalopathy has resolved  CT Head wo Contrast: No acute intracranial abn. Chronic cortical encephalomalacia in right parietal and occipital lobes, and moderate brainstem atrophy and/or Wallerian degeneration - greater on the right Patient's left-sided deficits are chronic Continue monitor mental status - has improved  Treatment of metabolic derangements as outlined above Supportive care   T2DM Monitor fasting Glc Follow outpatient  GOC:  Palliative d/w patient re: importance of thickened liquids Pt considering home hospice if deterioration  DNR            Discharge Instructions  Allergies as of 08/02/2023   No Known Allergies      Medication List     TAKE these medications    acetaminophen 325 MG tablet Commonly known as: TYLENOL Take 1-2 tablets (325-650 mg total) by mouth every 4 (four) hours as needed for mild pain.   albuterol (2.5 MG/3ML) 0.083% nebulizer solution Commonly known as: PROVENTIL Take 3 mLs (2.5 mg total) by nebulization every 4 (four) hours as needed for wheezing or shortness of breath.   amitriptyline 10 MG tablet Commonly known as: ELAVIL Take 1 tablet (10 mg total) by mouth at bedtime.   aspirin 81 MG chewable tablet Chew 1 tablet (81 mg total) by mouth daily.   chlorproMAZINE 25 MG tablet Commonly known as: THORAZINE Take 3 tablets (75 mg total) by mouth at bedtime.   clonazePAM 0.5 MG tablet Commonly known as: KlonoPIN Take 0.5 tablets (0.25 mg total) by mouth 2 (two) times daily. Take 1/2 tab twice daily. No dissolving tablets.   clopidogrel 75 MG tablet Commonly known as: PLAVIX Take 1 tablet (75 mg total) by mouth daily.   cyclobenzaprine 10 MG tablet Commonly known as: FLEXERIL TAKE ONE TABLET BY MOUTH AT BEDTIME   Dexcom G7 Sensor Misc APPLY ONE SENSOR TO THE BACK OF YOUR UPPER ARM. REPLACE EVERY 10 DAYS.   escitalopram 20 MG tablet Commonly known as:  Lexapro Take 1 tablet (20 mg total) by mouth daily.   feeding supplement (NEPRO CARB STEADY) Liqd Take 237 mLs by mouth 3 (three) times daily between meals.   ferrous sulfate 324 (65 Fe) MG Tbec Take 1 tablet (324 mg total) by mouth 2 (two) times daily.   fludrocortisone 0.1 MG tablet Commonly known as: FLORINEF Take 1 tablet (0.1 mg total) by mouth daily.   icosapent Ethyl 1 g capsule Commonly known as: VASCEPA Take 2 g by mouth 2 (two) times daily.   lamoTRIgine 25 MG tablet Commonly known as: LaMICtal Take 1 tablet (25 mg total) by mouth daily. What changed: when to take this   lamoTRIgine 200 MG tablet Commonly known as: LAMICTAL Take 1 tablet (200 mg total) by mouth at bedtime. What changed: Another medication with the same name was changed. Make sure you understand how and when to take each.   metoprolol succinate 25 MG 24 hr tablet Commonly known as: TOPROL-XL Take 1 tablet (25 mg total) by mouth daily.   Mounjaro 7.5 MG/0.5ML Pen Generic drug: tirzepatide INJECT THE CONTENTS OF ONE PEN UNDER THE SKIN WEEKLY ON THE SAME DAY EACH WEEK  nitroGLYCERIN 0.4 MG SL tablet Commonly known as: NITROSTAT Place 1 tablet (0.4 mg total) under the tongue every 5 (five) minutes as needed for chest pain.   oxyCODONE 15 MG immediate release tablet Commonly known as: ROXICODONE Take 1 tablet (15 mg total) by mouth 2 (two) times daily as needed for pain.   pantoprazole 40 MG tablet Commonly known as: PROTONIX TAKE ONE TABLET BY MOUTH ONE TIME DAILY   pregabalin 225 MG capsule Commonly known as: LYRICA Take 1 capsule (225 mg total) by mouth 2 (two) times daily.   Repatha SureClick 140 MG/ML Soaj Generic drug: Evolocumab Inject 140 mg into the skin as directed. Inject 1 Dose into the skin every 14 (fourteen) days.   Stiolto Respimat 2.5-2.5 MCG/ACT Aers Generic drug: Tiotropium Bromide-Olodaterol Inhale 2 puffs into the lungs daily. What changed: Another medication with  the same name was added. Make sure you understand how and when to take each.   Stiolto Respimat 2.5-2.5 MCG/ACT Aers Generic drug: Tiotropium Bromide-Olodaterol Inhale 2 puffs into the lungs daily. What changed: You were already taking a medication with the same name, and this prescription was added. Make sure you understand how and when to take each.   topiramate 25 MG tablet Commonly known as: Topamax Take 1 tablet (25 mg total) by mouth at bedtime.   UltiCare Mini Pen Needles 31G X 6 MM Misc Generic drug: Insulin Pen Needle USE PEN NEEDLES TWO TIMES DAILY               Durable Medical Equipment  (From admission, onward)           Start     Ordered   08/02/23 1058  For home use only DME Vest percussion  Once       Comments: Chat w/ Morrie Sheldon at [facility]   08/02/23 1057             Follow-up Information     Iran Ouch, MD. Call.   Specialty: Cardiology Why: confirm follow up appointment Nicolasa Ducking NP is working on moving up her currently scheduled follow up) Contact information: [address] [place] [address] [phone]         Eden Emms, NP. Schedule an appointment as soon as possible for a visit.   Specialties: Nurse Practitioner, Family Medicine Why: hospital follow up appointment in 1-2 weeks Contact information: [address] [place] [address] [phone]                 No Known Allergies   Subjective: pt states feeling well this morning, breathing is at her baseline, no other concerns today    Discharge Exam: BP 110/69   Pulse 81   Temp 98.4 F (36.9 C) (Oral)   Resp 18   Ht 5\' 7"  (1.702 m)   Wt 88.3 kg   SpO2 94%   BMI 30.49 kg/m  General: Pt is alert, awake, not in acute distress Cardiovascular: RRR, S1/S2 +, no rubs, no gallops Respiratory: CTA bilaterally except faint crackles at bases c/w atelectasis, no wheezing, no rhonchi Extremities: no edema, no  cyanosis     The results of significant diagnostics from this hospitalization (including imaging, microbiology, ancillary and laboratory) are listed below for reference.     Microbiology: Recent Results (from the past 240 hour(s))  Blood Culture (routine x 2)     Status: None (Preliminary result)   Collection Time: 07/30/23  9:12 AM   Specimen:  BLOOD  Result Value Ref Range Status   Specimen Description BLOOD LEFT ANTECUBITAL  Final   Special Requests   Final    BOTTLES DRAWN AEROBIC AND ANAEROBIC Blood Culture adequate volume   Culture   Final    NO GROWTH 3 DAYS Performed at [facility], [address], [place], [place] [address]    Report Status PENDING  Incomplete  Blood Culture (routine x 2)     Status: None (Preliminary result)   Collection Time: 07/30/23  9:12 AM   Specimen: BLOOD  Result Value Ref Range Status   Specimen Description BLOOD RIGHT ANTECUBITAL  Final   Special Requests   Final    BOTTLES DRAWN AEROBIC AND ANAEROBIC Blood Culture adequate volume   Culture   Final    NO GROWTH 3 DAYS Performed at [facility], [address], [place], [place] [address]    Report Status PENDING  Incomplete  SARS Coronavirus 2 by RT PCR (hospital order, performed in [facility] hospital lab) *cepheid single result test* Anterior Nasal Swab     Status: None   Collection Time: 07/30/23  9:39 AM   Specimen: Anterior Nasal Swab  Result Value Ref Range Status   SARS Coronavirus 2 by RT PCR NEGATIVE NEGATIVE Final    Comment: (NOTE) SARS-CoV-2 target nucleic acids are NOT DETECTED.  The SARS-CoV-2 RNA is generally detectable in upper and lower respiratory specimens during the acute phase of infection. The lowest concentration of SARS-CoV-2 viral copies this assay can detect is 250 copies / mL. A negative result does not preclude SARS-CoV-2 infection and should not be used as the sole basis for treatment or other patient management decisions.  A negative  result may occur with improper specimen collection / handling, submission of specimen other than nasopharyngeal swab, presence of viral mutation(s) within the areas targeted by this assay, and inadequate number of viral copies (<250 copies / mL). A negative result must be combined with clinical observations, patient history, and epidemiological information.  Fact Sheet for Patients:   RoadLapTop.co.za  Fact Sheet for Healthcare Providers: http://kim-miller.com/  This test is not yet approved or  cleared by the [place] FDA and has been authorized for detection and/or diagnosis of SARS-CoV-2 by FDA under an Emergency Use Authorization (EUA).  This EUA will remain in effect (meaning this test can be used) for the duration of the COVID-19 declaration under Section 564(b)(1) of the Act, 21 U.S.C. section 360bbb-3(b)(1), unless the authorization is terminated or revoked sooner.  Performed at [facility], [address] [place], [place] [address]   MRSA Next Gen by PCR, Nasal     Status: None   Collection Time: 07/30/23  1:40 PM   Specimen: Nasal Mucosa; Nasal Swab  Result Value Ref Range Status   MRSA by PCR Next Gen NOT DETECTED NOT DETECTED Final    Comment: (NOTE) The GeneXpert MRSA Assay (FDA approved for NASAL specimens only), is one component of a comprehensive MRSA colonization surveillance program. It is not intended to diagnose MRSA infection nor to guide or monitor treatment for MRSA infections. Test performance is not FDA approved in patients less than 58 years old. Performed at [facility], [address] [place], [place] [address]      Labs: BNP (last 3 results) Recent Labs    07/03/23 1125 07/16/23 2058 07/30/23 0838  BNP 45.7 43.9 90.1   Basic Metabolic Panel: Recent Labs  Lab 07/26/23 1628 07/30/23 0630 07/30/23 0912  08/01/23 0523  NA 139 135 136 139  K 4.0 4.1 4.4 3.4*  CL  100 97* 96* 102  CO2 32 30 31 27   GLUCOSE 146* 128* 114* 113*  BUN 10 12 12 9   CREATININE 0.74 0.98 0.88 0.73  CALCIUM 9.5 9.2 9.4 9.8   Liver Function Tests: Recent Labs  Lab 07/26/23 1628 07/30/23 0912  AST 20 30  ALT 22 16  ALKPHOS  --  65  BILITOT 0.2 0.9  PROT 6.1 6.5  ALBUMIN  --  3.4*   No results for input(s): "LIPASE", "AMYLASE" in the last 168 hours. No results for input(s): "AMMONIA" in the last 168 hours. CBC: Recent Labs  Lab 07/26/23 1628 07/30/23 0838 07/31/23 0304 08/01/23 0523  WBC 9.6 21.1* 19.5* 12.0*  HGB 9.5* 9.1* 8.5* 9.8*  HCT 31.1* 31.2* 28.2* 33.9*  MCV 70.5* 74.5* 72.3* 75.0*  PLT 421* 374 368 463*   Cardiac Enzymes: No results for input(s): "CKTOTAL", "CKMB", "CKMBINDEX", "TROPONINI" in the last 168 hours. BNP: Invalid input(s): "POCBNP" CBG: Recent Labs  Lab 07/30/23 1344  GLUCAP 135*   D-Dimer No results for input(s): "DDIMER" in the last 72 hours. Hgb A1c No results for input(s): "HGBA1C" in the last 72 hours. Lipid Profile No results for input(s): "CHOL", "HDL", "LDLCALC", "TRIG", "CHOLHDL", "LDLDIRECT" in the last 72 hours. Thyroid function studies No results for input(s): "TSH", "T4TOTAL", "T3FREE", "THYROIDAB" in the last 72 hours.  Invalid input(s): "FREET3" Anemia work up No results for input(s): "VITAMINB12", "FOLATE", "FERRITIN", "TIBC", "IRON", "RETICCTPCT" in the last 72 hours. Urinalysis    Component Value Date/Time   COLORURINE AMBER (A) 07/30/2023 1021   APPEARANCEUR CLOUDY (A) 07/30/2023 1021   APPEARANCEUR Clear 09/11/2021 1413   LABSPEC 1.023 07/30/2023 1021   LABSPEC 1.024 01/11/2015 1022   PHURINE 6.0 07/30/2023 1021   GLUCOSEU NEGATIVE 07/30/2023 1021   GLUCOSEU >=1000 (A) 10/19/2019 1034   HGBUR NEGATIVE 07/30/2023 1021   BILIRUBINUR NEGATIVE 07/30/2023 1021   BILIRUBINUR Negative 09/11/2021 1413   BILIRUBINUR Negative 01/11/2015 1022   KETONESUR NEGATIVE 07/30/2023 1021   PROTEINUR NEGATIVE  07/30/2023 1021   UROBILINOGEN 1.0 10/19/2019 1049   UROBILINOGEN 1.0 10/19/2019 1034   NITRITE NEGATIVE 07/30/2023 1021   LEUKOCYTESUR NEGATIVE 07/30/2023 1021   LEUKOCYTESUR Negative 01/11/2015 1022   Sepsis Labs Recent Labs  Lab 07/26/23 1628 07/30/23 0838 07/31/23 0304 08/01/23 0523  WBC 9.6 21.1* 19.5* 12.0*   Microbiology Recent Results (from the past 240 hour(s))  Blood Culture (routine x 2)     Status: None (Preliminary result)   Collection Time: 07/30/23  9:12 AM   Specimen: BLOOD  Result Value Ref Range Status   Specimen Description BLOOD LEFT ANTECUBITAL  Final   Special Requests   Final    BOTTLES DRAWN AEROBIC AND ANAEROBIC Blood Culture adequate volume   Culture   Final    NO GROWTH 3 DAYS Performed at [facility], [address], [place], [place] [address]    Report Status PENDING  Incomplete  Blood Culture (routine x 2)     Status: None (Preliminary result)   Collection Time: 07/30/23  9:12 AM   Specimen: BLOOD  Result Value Ref Range Status   Specimen Description BLOOD RIGHT ANTECUBITAL  Final   Special Requests   Final    BOTTLES DRAWN AEROBIC AND ANAEROBIC Blood Culture adequate volume   Culture   Final    NO GROWTH 3 DAYS Performed at [facility], [address] [place], [place] [address]    Report Status PENDING  Incomplete  SARS Coronavirus 2 by RT PCR (hospital order, performed in [facility] hospital lab) *cepheid single result test* Anterior Nasal Swab     Status: None   Collection Time: 07/30/23  9:39 AM   Specimen: Anterior Nasal Swab  Result Value Ref Range Status   SARS Coronavirus 2 by RT PCR NEGATIVE NEGATIVE Final    Comment: (NOTE) SARS-CoV-2 target nucleic acids are NOT DETECTED.  The SARS-CoV-2 RNA is generally detectable in upper and lower respiratory specimens during the acute phase of infection. The lowest concentration of SARS-CoV-2 viral copies this assay can detect is 250 copies / mL. A negative  result does not preclude SARS-CoV-2 infection and should not be used as the sole basis for treatment or other patient management decisions.  A negative result may occur with improper specimen collection / handling, submission of specimen other than nasopharyngeal swab, presence of viral mutation(s) within the areas targeted by this assay, and inadequate number of viral copies (<250 copies / mL). A negative result must be combined with clinical observations, patient history, and epidemiological information.  Fact Sheet for Patients:   RoadLapTop.co.za  Fact Sheet for Healthcare Providers: http://kim-miller.com/  This test is not yet approved or  cleared by the [place] FDA and has been authorized for detection and/or diagnosis of SARS-CoV-2 by FDA under an Emergency Use Authorization (EUA).  This EUA will remain in effect (meaning this test can be used) for the duration of the COVID-19 declaration under Section 564(b)(1) of the Act, 21 U.S.C. section 360bbb-3(b)(1), unless the authorization is terminated or revoked sooner.  Performed at [facility], [address] [place], [place] [address]   MRSA Next Gen by PCR, Nasal     Status: None   Collection Time: 07/30/23  1:40 PM   Specimen: Nasal Mucosa; Nasal Swab  Result Value Ref Range Status   MRSA by PCR Next Gen NOT DETECTED NOT DETECTED Final    Comment: (NOTE) The GeneXpert MRSA Assay (FDA approved for NASAL specimens only), is one component of a comprehensive MRSA colonization surveillance program. It is not intended to diagnose MRSA infection nor to guide or monitor treatment for MRSA infections. Test performance is not FDA approved in patients less than 24 years old. Performed at [facility], [address] [place], [place] [address]    Imaging CT Angio Chest Pulmonary Embolism (PE) W or WO Contrast  Result Date: 07/30/2023 CLINICAL DATA:   52 year old female with altered mental status, abnormal breathing, undergoing treatment for pneumonia. History of previous cerebral infarct with left side deficit. EXAM: CT ANGIOGRAPHY CHEST WITH CONTRAST TECHNIQUE: Multidetector CT imaging of the chest was performed using the standard protocol during bolus administration of intravenous contrast. Multiplanar CT image reconstructions and MIPs were obtained to evaluate the vascular anatomy. RADIATION DOSE REDUCTION: This exam was performed according to the departmental dose-optimization program which includes automated exposure control, adjustment of the mA and/or kV according to patient size and/or use of iterative reconstruction technique. CONTRAST:  75mL OMNIPAQUE IOHEXOL 350 MG/ML SOLN COMPARISON:  Portable chest 0918 hours today.  Prior CTA 07/16/2023. Speech pathology evaluation 07/05/2023. FINDINGS: Cardiovascular: Good contrast bolus timing in the pulmonary arterial tree. No pulmonary artery filling defect. Prior CABG. Calcified aortic atherosclerosis. Cardiac size remains within normal limits. No pericardial effusion. Mediastinum/Nodes: Postoperative changes. Reactive appearing mediastinal lymph nodes including right paratracheal, similar to last month. No mediastinal mass or suspicious lymphadenopathy. Lungs/Pleura:  Major airways are patent with some atelectatic changes. But there is bilateral lower lobe consolidation with air bronchograms, similar to but progressed since 07/16/2023. No pleural effusions. And there is similar dependent peribronchial opacity now in the middle lobes, additional patchy and nodular middle lobe bronchovascular involvement, and early mostly dependent peribronchial involvement also in the upper lobes. Upper Abdomen: Negative visible liver, spleen, pancreas, adrenal glands and bowel in the upper abdomen. Musculoskeletal: Sternotomy. No acute osseous abnormality identified. Review of the MIP images confirms the above findings.  IMPRESSION: 1. Negative for acute pulmonary embolus. 2. Multi lobar and dependent bilateral pneumonia, progressed since 07/16/2023 CTA. Consider sequelae of aspiration in this clinical setting. 3.  Aortic Atherosclerosis (ICD10-I70.0).  Prior CABG. Electronically Signed   By: Odessa Fleming M.D.   On: 07/30/2023 11:13   CT Head Wo Contrast  Result Date: 07/30/2023 CLINICAL DATA:  52 year old female with altered mental status, abnormal breathing, undergoing treatment for pneumonia. History of previous cerebral infarct with left side deficit. EXAM: CT HEAD WITHOUT CONTRAST TECHNIQUE: Contiguous axial images were obtained from the base of the skull through the vertex without intravenous contrast. RADIATION DOSE REDUCTION: This exam was performed according to the departmental dose-optimization program which includes automated exposure control, adjustment of the mA and/or kV according to patient size and/or use of iterative reconstruction technique. COMPARISON:  Brain MRI 07/16/2023.  Head CT 06/11/2023. FINDINGS: Brain: Right parietal and occipital lobe cortical encephalomalacia, gliosis appears stable. And there is chronic right brainstem atrophy, confirmed on the MRI last month. No superimposed No midline shift, ventriculomegaly, mass effect, evidence of mass lesion, intracranial hemorrhage or evidence of cortically based acute infarction. Gray-white matter differentiation is within normal limits throughout the brain. Vascular: No suspicious intracranial vascular hyperdensity. Calcified atherosclerosis at the skull base. Skull: Intact.  No acute osseous abnormality identified. Sinuses/Orbits: Visualized paranasal sinuses and mastoids are stable and well aerated. Other: Visualized orbits and scalp soft tissues are within normal limits. IMPRESSION: 1. No acute intracranial abnormality. 2. Chronic cortical encephalomalacia in the right parietal and occipital lobes, and moderate brainstem atrophy and/or Wallerian degeneration  - greater on the right. Electronically Signed   By: Odessa Fleming M.D.   On: 07/30/2023 10:59   DG Chest Port 1 View  Result Date: 07/30/2023 CLINICAL DATA:  Abnormal breathing EXAM: PORTABLE CHEST 1 VIEW COMPARISON:  CXR 07/16/23 FINDINGS: Status post median sternotomy and CABG. Unchanged cardiac and mediastinal contours. No large pleural effusion. No pneumothorax. There are prominent bilateral interstitial opacities there are somewhat basal predominant. Findings could represent pulmonary edema or multifocal infection. No radiographically apparent displaced rib fractures. Visualized upper abdomen is unremarkable. IMPRESSION: Prominent bilateral interstitial opacities, which are somewhat basal predominant. Findings could represent pulmonary edema or multifocal infection. Electronically Signed   By: Lorenza Cambridge M.D.   On: 07/30/2023 09:29      Time coordinating discharge: over 30 minutes  SIGNED:  Sunnie Nielsen DO [facility]

## 2023-08-02 NOTE — Plan of Care (Signed)
  Problem: Education: Goal: Knowledge of General Education information will improve Description: Including pain rating scale, medication(s)/side effects and non-pharmacologic comfort measures Outcome: Progressing   Problem: Clinical Measurements: Goal: Will remain free from infection Outcome: Progressing   Problem: Clinical Measurements: Goal: Respiratory complications will improve Outcome: Progressing   Problem: Clinical Measurements: Goal: Cardiovascular complication will be avoided Outcome: Progressing   Problem: Nutrition: Goal: Adequate nutrition will be maintained Outcome: Progressing   Problem: Pain Managment: Goal: General experience of comfort will improve Outcome: Progressing   Problem: Safety: Goal: Ability to remain free from injury will improve Outcome: Progressing   

## 2023-08-02 NOTE — Progress Notes (Signed)
Patient has hemiplegia and hemiparesis affecting the diaphragm ( J 98.6 ), this has been exacerbated by multiple cerebral infarcts. Patient needs HFCWO vest ( G2952) to effectively help pt clear airway to prevent recurrent lung infections and hospitalizations as a result .

## 2023-08-02 NOTE — TOC Transition Note (Addendum)
Transition of Care [facility]) - CM/SW Discharge Note   Patient Details  Name: Erika [name] MRN: [id number] Date of Birth: [birth year]  [facility] CM/SW Contact:  Liliana Cline, LCSW Phone Number: 08/02/2023, 9:54 AM   Clinical Narrative:    Patient to DC home today. Notified Kelsey with [facility] - they will follow for RN, PT, OT.   10:57- Patient asked MD if a RN or Aide can come out today. Tito Dine with [facility], she is checking to see the soonest that anyone can come out.  Also received notification to check with Morrie Sheldon at [place] about a Education officer, environmental. Spoke to [place] who is checking to see what is needed to get patient a [employer]. He states they are patient's home o2 provider.   11:52- Morrie Sheldon with [employer] is sending forms that DO needs to sign for vest. Adelina Mings with [facility] states the earliest [facility] can see the patient is Monday - RN and DO updated.   12:54- Notified Morrie Sheldon with [facility] that note is in that he stated is required for [employer] and DO is signing the order form.    Final next level of care: Home w Home Health Services Barriers to Discharge: Barriers Resolved   Patient Goals and CMS Choice CMS Medicare.gov Compare Post Acute Care list provided to:: Patient Choice offered to / list presented to : Patient  Discharge Placement                         Discharge Plan and Services Additional resources added to the After Visit Summary for       Post Acute Care Choice: Home Health, Skilled Nursing Facility                    HH Arranged: PT, OT, RN [facility] Agency: [facility] Date [facility] Agency Contacted: 08/02/23   Representative spoke with at [facility] Agency: Adelina Mings  Social Determinants of Health (SDOH) Interventions SDOH Screenings   Food Insecurity: No Food Insecurity (07/29/2023)  Housing: Patient Unable To Answer (07/29/2023)  Transportation Needs: No Transportation Needs (07/29/2023)  Utilities: Not At Risk (07/17/2023)   Recent Concern: Utilities - At Risk (06/13/2023)  Depression (PHQ2-9): High Risk (07/26/2023)  Tobacco Use: Medium Risk (07/26/2023)     Readmission Risk Interventions    07/31/2023    1:50 PM 07/17/2023    2:19 PM 12/24/2021   12:23 PM  Readmission Risk Prevention Plan  Transportation Screening Complete Complete Complete  PCP or Specialist Appt within 3-5 Days  Complete   HRI or Home Care Consult  Complete   Social Work Consult for Recovery Care Planning/Counseling  Complete   Palliative Care Screening  Complete   Medication Review Ocean[facility]) Referral to Pharmacy Referral to Pharmacy Complete  PCP or Specialist appointment within 3-5 days of discharge Complete  Complete  HRI or Home Care Consult Complete  Complete  SW Recovery Care/Counseling Consult Complete    Palliative Care Screening Not Applicable    Skilled Nursing Facility Not Applicable

## 2023-08-05 ENCOUNTER — Telehealth: Payer: Self-pay | Admitting: *Deleted

## 2023-08-05 ENCOUNTER — Other Ambulatory Visit: Payer: Self-pay | Admitting: Cardiovascular Disease

## 2023-08-05 DIAGNOSIS — I25118 Atherosclerotic heart disease of native coronary artery with other forms of angina pectoris: Secondary | ICD-10-CM

## 2023-08-05 DIAGNOSIS — E785 Hyperlipidemia, unspecified: Secondary | ICD-10-CM

## 2023-08-05 DIAGNOSIS — I6529 Occlusion and stenosis of unspecified carotid artery: Secondary | ICD-10-CM

## 2023-08-05 DIAGNOSIS — I739 Peripheral vascular disease, unspecified: Secondary | ICD-10-CM

## 2023-08-05 DIAGNOSIS — R0602 Shortness of breath: Secondary | ICD-10-CM

## 2023-08-05 DIAGNOSIS — I951 Orthostatic hypotension: Secondary | ICD-10-CM

## 2023-08-05 NOTE — Transitions of Care (Post Inpatient/ED Visit) (Signed)
08/05/2023  Name: Erika [name] MRN: [id number] DOB: 07-23-1971  Today's TOC FU Call Status: Today's TOC FU Call Status:: Successful TOC FU Call Completed TOC FU Call Complete Date: 08/05/23  Transition Care Management Follow-up Telephone Call Date of Discharge: 08/02/23 Discharge Facility: [facility]) Type of Discharge: Inpatient Admission Primary Inpatient Discharge Diagnosis:: respiratory failure How have you been since you were released from the hospital?: Better Any questions or concerns?: Yes Patient Questions/Concerns:: Trying to get myappt scheduled/ RN and care guide assisted pt in getting all appointments scheduled Patient Questions/Concerns Addressed: Other:  Items Reviewed: Did you receive and understand the discharge instructions provided?: Yes Medications obtained,verified, and reconciled?: Yes (Medications Reviewed) Any new allergies since your discharge?: No Dietary orders reviewed?: No Do you have support at home?: Yes People in Home: spouse Name of Support/Comfort Primary Source: Molli Hazard  Medications Reviewed Today: Medications Reviewed Today     Reviewed by Luella Cook, RN (Case Manager) on 08/05/23 at 1655  Med List Status: <None>   Medication Order Taking? Sig Documenting Provider Last Dose Status Informant  acetaminophen (TYLENOL) 325 MG tablet [id number] Yes Take 1-2 tablets (325-650 mg total) by mouth every 4 (four) hours as needed for mild pain. Jacquelynn Cree, PA-C Taking Active Spouse/Significant Other  albuterol (PROVENTIL) (2.5 MG/3ML) 0.083% nebulizer solution [id number]  Take 3 mLs (2.5 mg total) by nebulization every 4 (four) hours as needed for wheezing or shortness of breath. Enedina Finner, MD  Expired 08/04/23 2359 Spouse/Significant Other  amitriptyline (ELAVIL) 10 MG tablet [id number] Yes Take 1 tablet (10 mg total) by mouth at bedtime. Horton Chin, MD Taking Active Spouse/Significant Other  aspirin  81 MG chewable tablet [id number] Yes Chew 1 tablet (81 mg total) by mouth daily. Sunnie Nielsen, DO Taking Active   chlorproMAZINE (THORAZINE) 25 MG tablet [id number] Yes Take 3 tablets (75 mg total) by mouth at bedtime. Cleotis Nipper, MD Taking Active Spouse/Significant Other  clonazePAM (KLONOPIN) 0.5 MG tablet [id number] Yes Take 0.5 tablets (0.25 mg total) by mouth 2 (two) times daily. Take 1/2 tab twice daily. No dissolving tablets. Cleotis Nipper, MD Taking Active Spouse/Significant Other  clopidogrel (PLAVIX) 75 MG tablet [id number] Yes Take 1 tablet (75 mg total) by mouth daily. Sater, Pearletha Furl, MD Taking Active Spouse/Significant Other  Continuous Glucose Sensor (DEXCOM G7 SENSOR) MISC [id number] Yes APPLY ONE SENSOR TO THE BACK OF YOUR UPPER ARM. REPLACE EVERY 10 DAYS. Eden Emms, NP Taking Active Spouse/Significant Other  cyclobenzaprine (FLEXERIL) 10 MG tablet [id number] Yes TAKE ONE TABLET BY MOUTH AT BEDTIME Raulkar, Drema Pry, MD Taking Active Spouse/Significant Other  escitalopram (LEXAPRO) 20 MG tablet [id number] Yes Take 1 tablet (20 mg total) by mouth daily. Cleotis Nipper, MD Taking Active Spouse/Significant Other  Evolocumab [facility] SURECLICK) 140 MG/ML Ivory Broad [id number] Yes Inject 140 mg into the skin as directed. Inject 1 Dose into the skin every 14 (fourteen) days. Furth, Cadence H, PA-C Taking Active Spouse/Significant Other  ferrous sulfate 324 (65 Fe) MG TBEC [id number] Yes Take 1 tablet (324 mg total) by mouth 2 (two) times daily. Iran Ouch, MD Taking Active Spouse/Significant Other  fludrocortisone (FLORINEF) 0.1 MG tablet [id number] Yes Take 1 tablet (0.1 mg total) by mouth daily. Iran Ouch, MD Taking Active Spouse/Significant Other  icosapent Ethyl (VASCEPA) 1 g capsule [id number] Yes Take 2 g by mouth 2 (two) times daily. [provider] Taking Active Spouse/Significant Other  lamoTRIgine (LAMICTAL) 200 MG tablet [id number]  Yes Take 1 tablet (200 mg  total) by mouth at bedtime. Cleotis Nipper, MD Taking Active Spouse/Significant Other  lamoTRIgine (LAMICTAL) 25 MG tablet [id number] Yes Take 1 tablet (25 mg total) by mouth daily.  Patient taking differently: Take 25 mg by mouth at bedtime.   Cleotis Nipper, MD Taking Active Spouse/Significant Other  metoprolol succinate (TOPROL-XL) 25 MG 24 hr tablet [id number] Yes Take 1 tablet (25 mg total) by mouth daily. Iran Ouch, MD Taking Active Spouse/Significant Other  MOUNJARO 7.5 MG/0.5ML Pen [id number] Yes INJECT THE CONTENTS OF ONE PEN UNDER THE SKIN WEEKLY ON THE SAME DAY EACH WEEK Eden Emms, NP Taking Active Spouse/Significant Other  nitroGLYCERIN (NITROSTAT) 0.4 MG SL tablet [id number] Yes Place 1 tablet (0.4 mg total) under the tongue every 5 (five) minutes as needed for chest pain. Iran Ouch, MD Taking Active Spouse/Significant Other  Nutritional Supplements (FEEDING SUPPLEMENT, NEPRO CARB STEADY,) LIQD [id number] Yes Take 237 mLs by mouth 3 (three) times daily between meals. Sunnie Nielsen, DO Taking Active   oxyCODONE (ROXICODONE) 15 MG immediate release tablet [id number] Yes Take 1 tablet (15 mg total) by mouth 2 (two) times daily as needed for pain. Horton Chin, MD Taking Active Spouse/Significant Other  pantoprazole (PROTONIX) 40 MG tablet [id number] Yes TAKE ONE TABLET BY MOUTH ONE TIME DAILY Raulkar, Drema Pry, MD Taking Active Spouse/Significant Other  pregabalin (LYRICA) 225 MG capsule [id number] Yes Take 1 capsule (225 mg total) by mouth 2 (two) times daily. Jones Bales, NP Taking Active Spouse/Significant Other  Tiotropium Bromide-Olodaterol (STIOLTO RESPIMAT) 2.5-2.5 MCG/ACT AERS [id number]  Inhale 2 puffs into the lungs daily. Glenford Bayley, NP  Active Spouse/Significant Other  Tiotropium Bromide-Olodaterol (STIOLTO RESPIMAT) 2.5-2.5 MCG/ACT AERS [id number] Yes Inhale 2 puffs into the lungs daily. Sunnie Nielsen, DO Taking Active   topiramate  (TOPAMAX) 25 MG tablet [id number] Yes Take 1 tablet (25 mg total) by mouth at bedtime. Horton Chin, MD Taking Active Spouse/Significant Other  ULTICARE MINI PEN NEEDLES 31G X 6 MM MISC [id number] Yes USE PEN NEEDLES TWO TIMES DAILY Eden Emms, NP Taking Active Spouse/Significant Other            Home Care and Equipment/Supplies: Were Home Health Services Ordered?: Yes Name of Home Health Agency:: [facility] Has Agency set up a time to come to your home?: Yes First Home Health Visit Date: 08/05/23 Any new equipment or medical supplies ordered?: NA Were you able to get the equipment/medical supplies?: No Do you have any questions related to the use of the equipment/supplies?: No  Functional Questionnaire: Do you need assistance with bathing/showering or dressing?: Yes Do you need assistance with meal preparation?: Yes Do you need assistance with eating?: No Do you have difficulty maintaining continence: No Do you need assistance with getting out of bed/getting out of a chair/moving?: No Do you have difficulty managing or taking your medications?: No  Follow up appointments reviewed: PCP Follow-up appointment confirmed?: Yes Date of PCP follow-up appointment?: 08/08/23 Follow-up Provider: Mordecai Maes NP Specialist Hospital Follow-up appointment confirmed?: Yes Date of Specialist follow-up appointment?: 09/05/23 Follow-Up Specialty Provider:: [id number] H[facility]/ [id number] Rhunette Croft NP Do you need transportation to your follow-up appointment?: No  SDOH Interventions Today    Flowsheet Row Most Recent Value  SDOH Interventions   Food Insecurity Interventions Intervention Not Indicated  Transportation Interventions Intervention Not Indicated, Patient Resources (Friends/Family)  Gayla Doss will take to appts]      Interventions Today  Flowsheet Row Most Recent Value  General Interventions   General Interventions Discussed/Reviewed General Interventions  Discussed, General Interventions Reviewed, Communication with  [SW and Care coordination nurse to make them aware patient recently discharged.]  Doctor Visits Discussed/Reviewed Doctor Visits Discussed, Doctor Visits Reviewed  Exercise Interventions   Exercise Discussed/Reviewed Exercise Discussed  Rolene Arbour is doing PT]  Pharmacy Interventions   Pharmacy Dicussed/Reviewed Pharmacy Topics Discussed, Pharmacy Topics Reviewed      TOC Interventions Today    Flowsheet Row Most Recent Value  TOC Interventions   TOC Interventions Discussed/Reviewed TOC Interventions Discussed, TOC Interventions Reviewed, Arranged PCP follow up less than 12 days/Care Guide scheduled  [RN called and made appt with pulmologist and cardiologist]       Gean Maidens BSN RN [facility] Care Management [phone]

## 2023-08-06 ENCOUNTER — Ambulatory Visit: Payer: Self-pay | Admitting: *Deleted

## 2023-08-06 DIAGNOSIS — J9611 Chronic respiratory failure with hypoxia: Secondary | ICD-10-CM | POA: Diagnosis not present

## 2023-08-06 DIAGNOSIS — E1169 Type 2 diabetes mellitus with other specified complication: Secondary | ICD-10-CM | POA: Diagnosis not present

## 2023-08-06 DIAGNOSIS — D696 Thrombocytopenia, unspecified: Secondary | ICD-10-CM | POA: Diagnosis not present

## 2023-08-06 DIAGNOSIS — J441 Chronic obstructive pulmonary disease with (acute) exacerbation: Secondary | ICD-10-CM | POA: Diagnosis not present

## 2023-08-06 DIAGNOSIS — Z9981 Dependence on supplemental oxygen: Secondary | ICD-10-CM | POA: Diagnosis not present

## 2023-08-06 DIAGNOSIS — K219 Gastro-esophageal reflux disease without esophagitis: Secondary | ICD-10-CM | POA: Diagnosis not present

## 2023-08-06 DIAGNOSIS — E669 Obesity, unspecified: Secondary | ICD-10-CM | POA: Diagnosis not present

## 2023-08-06 DIAGNOSIS — J690 Pneumonitis due to inhalation of food and vomit: Secondary | ICD-10-CM | POA: Diagnosis not present

## 2023-08-06 DIAGNOSIS — Z6830 Body mass index (BMI) 30.0-30.9, adult: Secondary | ICD-10-CM | POA: Diagnosis not present

## 2023-08-06 DIAGNOSIS — E876 Hypokalemia: Secondary | ICD-10-CM | POA: Diagnosis not present

## 2023-08-06 DIAGNOSIS — I5032 Chronic diastolic (congestive) heart failure: Secondary | ICD-10-CM | POA: Diagnosis not present

## 2023-08-06 DIAGNOSIS — I951 Orthostatic hypotension: Secondary | ICD-10-CM | POA: Diagnosis not present

## 2023-08-06 DIAGNOSIS — D509 Iron deficiency anemia, unspecified: Secondary | ICD-10-CM | POA: Diagnosis not present

## 2023-08-06 DIAGNOSIS — I69354 Hemiplegia and hemiparesis following cerebral infarction affecting left non-dominant side: Secondary | ICD-10-CM | POA: Diagnosis not present

## 2023-08-06 DIAGNOSIS — Z7985 Long-term (current) use of injectable non-insulin antidiabetic drugs: Secondary | ICD-10-CM | POA: Diagnosis not present

## 2023-08-06 DIAGNOSIS — I110 Hypertensive heart disease with heart failure: Secondary | ICD-10-CM | POA: Diagnosis not present

## 2023-08-06 NOTE — Patient Instructions (Signed)
Visit Information  Thank you for taking time to visit with me today. Please don't hesitate to contact me if I can be of assistance to you.   Following are the goals we discussed today:  Patient verbalized having no community/mental health needs at this time  If you are experiencing a Mental Health or Behavioral Health Crisis or need someone to talk to, please call the Suicide and Crisis Lifeline: 988   Patient verbalizes understanding of instructions and care plan provided today and agrees to view in MyChart. Active MyChart status and patient understanding of how to access instructions and care plan via MyChart confirmed with patient.     No further follow up required: patient to contact this Child psychotherapist with any additional community resource needs   [employer], LCSW Clinical Social Worker  [facility] Care Management [phone]

## 2023-08-06 NOTE — Patient Outreach (Signed)
  Care Coordination   Initial Visit Note   08/06/2023 Name: Erika [name] MRN: [id number] DOB: May 15, 1971  Erika [name] is a 52 y.o. year old female who sees Cable, Genene Churn, NP for primary care. I spoke with  Idelle Crouch by phone today.  What matters to the patients health and wellness today?  Patient assessed for community/mental health needs. Patient verbalized having no additional community resource needs at this time. Patient encouraged to contact this social worker if any needs may arise.    Goals Addressed             This Visit's Progress    Care coordination activitiies       Interventions Today    Flowsheet Row Most Recent Value  Chronic Disease   Chronic disease during today's visit Other  [depression]  General Interventions   General Interventions Discussed/Reviewed General Interventions Discussed, General Interventions Reviewed  Doctor Visits Discussed/Reviewed Doctor Visits Discussed, Doctor Visits Reviewed  Mental Health Interventions   Mental Health Discussed/Reviewed Mental Health Discussed, Mental Health Reviewed, Depression, Coping Strategies  [confirmed that patient is active with a therapist through [facility], she also is followed by a psychiatrist Dr. Lolly Mustache , Patient verbalized having no additional community/mental health needs at this time]  Safety Interventions   Safety Discussed/Reviewed Safety Discussed  [patient safety assessed, patient denies thoughts of harm to self or others patient encouraged to call [phone] in the event of a mental health emergency]              SDOH assessments and interventions completed:  Yes  SDOH Interventions Today    Flowsheet Row Most Recent Value  SDOH Interventions   Food Insecurity Interventions Intervention Not Indicated  Housing Interventions Intervention Not Indicated  Transportation Interventions Intervention Not Indicated, Patient Resources (Friends/Family)  Utilities Interventions  Intervention Not Indicated        Care Coordination Interventions:  Yes, provided   Follow up plan: No further intervention required.   Encounter Outcome:  Pt. Visit Completed

## 2023-08-08 ENCOUNTER — Ambulatory Visit (INDEPENDENT_AMBULATORY_CARE_PROVIDER_SITE_OTHER): Payer: BC Managed Care – PPO | Admitting: Nurse Practitioner

## 2023-08-08 ENCOUNTER — Ambulatory Visit
Admission: RE | Admit: 2023-08-08 | Discharge: 2023-08-08 | Disposition: A | Discharge status: Home / Self Care | Payer: BC Managed Care – PPO | Source: Ambulatory Visit | Attending: Nurse Practitioner | Admitting: Nurse Practitioner

## 2023-08-08 ENCOUNTER — Encounter: Payer: Self-pay | Admitting: Nurse Practitioner

## 2023-08-08 VITALS — BP 100/60 | HR 69 | Temp 97.5°F | Ht 67.0 in | Wt 199.6 lb

## 2023-08-08 DIAGNOSIS — J189 Pneumonia, unspecified organism: Secondary | ICD-10-CM | POA: Diagnosis not present

## 2023-08-08 DIAGNOSIS — W19XXXA Unspecified fall, initial encounter: Secondary | ICD-10-CM

## 2023-08-08 DIAGNOSIS — I700 Atherosclerosis of aorta: Secondary | ICD-10-CM | POA: Insufficient documentation

## 2023-08-08 DIAGNOSIS — Z7982 Long term (current) use of aspirin: Secondary | ICD-10-CM | POA: Insufficient documentation

## 2023-08-08 DIAGNOSIS — Z09 Encounter for follow-up examination after completed treatment for conditions other than malignant neoplasm: Secondary | ICD-10-CM

## 2023-08-08 DIAGNOSIS — R410 Disorientation, unspecified: Secondary | ICD-10-CM

## 2023-08-08 DIAGNOSIS — Z7902 Long term (current) use of antithrombotics/antiplatelets: Secondary | ICD-10-CM | POA: Insufficient documentation

## 2023-08-08 DIAGNOSIS — S0990XA Unspecified injury of head, initial encounter: Secondary | ICD-10-CM | POA: Diagnosis not present

## 2023-08-08 DIAGNOSIS — M25512 Pain in left shoulder: Secondary | ICD-10-CM

## 2023-08-08 DIAGNOSIS — I6381 Other cerebral infarction due to occlusion or stenosis of small artery: Secondary | ICD-10-CM | POA: Diagnosis not present

## 2023-08-08 DIAGNOSIS — Z951 Presence of aortocoronary bypass graft: Secondary | ICD-10-CM | POA: Diagnosis not present

## 2023-08-08 DIAGNOSIS — S42292A Other displaced fracture of upper end of left humerus, initial encounter for closed fracture: Secondary | ICD-10-CM | POA: Insufficient documentation

## 2023-08-08 DIAGNOSIS — S42352A Displaced comminuted fracture of shaft of humerus, left arm, initial encounter for closed fracture: Secondary | ICD-10-CM | POA: Insufficient documentation

## 2023-08-08 DIAGNOSIS — R918 Other nonspecific abnormal finding of lung field: Secondary | ICD-10-CM | POA: Diagnosis not present

## 2023-08-08 DIAGNOSIS — R7989 Other specified abnormal findings of blood chemistry: Secondary | ICD-10-CM

## 2023-08-08 DIAGNOSIS — J9611 Chronic respiratory failure with hypoxia: Secondary | ICD-10-CM | POA: Insufficient documentation

## 2023-08-08 NOTE — Assessment & Plan Note (Signed)
Patient had a mechanical fall striking her head.  Denies LOC some confusion worse since fall pending stat CT scan of head to rule out possible brain hemorrhage given patient was heparinized in the hospital and on dual antiplatelet therapy outpatient

## 2023-08-08 NOTE — Assessment & Plan Note (Signed)
Per patients spouse confusion since found hypoxic in bed.  Has increased as of late patient recently fell and hit her head.  Pending stat CT scan of head given patient was heparinized in the hospital and on dual antiplatelet therapy outpatient.

## 2023-08-08 NOTE — Assessment & Plan Note (Signed)
History of abnormal CBC with anemia.  Patient does look pale in office her hemoglobin was trending up in the hospital pending labs today for recheck patient is on outpatient oral iron has had to have iron transfusions in the hospital

## 2023-08-08 NOTE — Assessment & Plan Note (Signed)
Mechanical fall with patient striking her head left shoulder and arm.  Patient was heparinized in the hospital currently on dual antiplatelet therapy history of a stroke we will get stat CT scan of head to make sure patient does not have a brain hemorrhage given increased confusion since fall

## 2023-08-08 NOTE — Assessment & Plan Note (Addendum)
Left shoulder pain status post mechanical fall pending x-ray will place patient in shoulder sling for comfort

## 2023-08-08 NOTE — Progress Notes (Signed)
Established Patient Office Visit  Subjective   Patient ID: Erika [name], female    DOB: May 13, 1971  Age: 52 y.o. MRN: [id number]  Chief Complaint  Patient presents with   Follow-up    Pt states she is not feeling well after fall yesterday. Pt felt fine leaving hospital but after fall feels worse. Pt states of injuring left shoulder. Pain level 6.     HPI   Hosptial follow up: Patient was admitted to the hospital on 07/30/2023.  Patient was taken to the emergency department for worsening altered mental status and hypoxia patient has been found with her CPAP off and low oxygen levels in the 70s placed on 6 L of oxygen and she continued to be hypoxic and sent her to the hospital patient was discharged on 08/02/2023.Marland Kitchen  Palliative care was consulted with possible hospice but patient was sent home to resume home health orders at baseline oxygen of 6 L no concern for aspiration pneumonia  States that she was walkin in her house. State that she lost her footing and fell on her left side and hit her head and left shoulder. States that sh eis more confused and short term memory is worse. This is since the hosptialization but worse since the fall. Sharp shooting pain with movement and a dull achy pain at reist    Review of Systems  Constitutional:  Positive for malaise/fatigue. Negative for chills and fever.  Respiratory:  Negative for shortness of breath.   Cardiovascular:  Negative for chest pain.  Musculoskeletal:  Positive for joint pain.  Neurological:  Positive for headaches. Negative for dizziness.      Objective:     BP 100/60   Pulse 69   Temp (!) 97.5 F (36.4 C) (Temporal)   Ht 5\' 7"  (1.702 m)   Wt 199 lb 9.6 oz (90.5 kg)   SpO2 91%   BMI 31.26 kg/m    Physical Exam Vitals and nursing note reviewed.  Constitutional:      Appearance: Normal appearance.  Cardiovascular:     Rate and Rhythm: Normal rate and regular rhythm.     Heart sounds: Normal heart sounds.   Pulmonary:     Effort: Pulmonary effort is normal.     Breath sounds: Normal breath sounds.  Musculoskeletal:        General: Tenderness present.       Arms:  Neurological:     Mental Status: She is alert and oriented to person, place, and time. Mental status is at baseline.     Comments: Patient did not know the date but knew day of the week, month, president and time   LLE 4/5  LUE 1/5      No results found for any visits on 08/08/23.    The ASCVD Risk score (Arnett DK, et al., 2019) failed to calculate for the following reasons:   The patient has a prior MI or stroke diagnosis    Assessment & Plan:   Problem List Items Addressed This Visit       Nervous and Auditory   Confusion    Per patients spouse confusion since found hypoxic in bed.  Has increased as of late patient recently fell and hit her head.  Pending stat CT scan of head given patient was heparinized in the hospital and on dual antiplatelet therapy outpatient.      Relevant Orders   CBC   Comprehensive metabolic panel     Other   Abnormal  CBC    History of abnormal CBC with anemia.  Patient does look pale in office her hemoglobin was trending up in the hospital pending labs today for recheck patient is on outpatient oral iron has had to have iron transfusions in the hospital      Relevant Orders   CBC   Comprehensive metabolic panel   Fall - Primary    Mechanical fall with patient striking her head left shoulder and arm.  Patient was heparinized in the hospital currently on dual antiplatelet therapy history of a stroke we will get stat CT scan of head to make sure patient does not have a brain hemorrhage given increased confusion since fall      Relevant Orders   CT HEAD WO CONTRAST ( )   DG Shoulder Left   DG Humerus Left   Hospital discharge follow-up    Did review most recent hospitalization note and labs.      Relevant Orders   CBC   Comprehensive metabolic panel   Head injury     Patient had a mechanical fall striking her head.  Denies LOC some confusion worse since fall pending stat CT scan of head to rule out possible brain hemorrhage given patient was heparinized in the hospital and on dual antiplatelet therapy outpatient      Relevant Orders   CT HEAD WO CONTRAST ( )   Left shoulder pain    Left shoulder pain status post mechanical fall pending x-ray will place patient in shoulder sling for comfort      Relevant Orders   DG Shoulder Left   DG Humerus Left   Sling    Return in about 4 weeks (around 09/05/2023) for PNA vs mental status .    Audria Nine, NP

## 2023-08-08 NOTE — Patient Instructions (Signed)
Nice to see you today I will be in touch with the imaging and labs once I have reviewed them Follow up with me in 1 month

## 2023-08-08 NOTE — Assessment & Plan Note (Signed)
Did review most recent hospitalization note and labs.

## 2023-08-09 ENCOUNTER — Encounter: Payer: Self-pay | Admitting: Physical Medicine and Rehabilitation

## 2023-08-09 ENCOUNTER — Encounter: Payer: Self-pay | Admitting: Nurse Practitioner

## 2023-08-09 ENCOUNTER — Other Ambulatory Visit: Payer: Self-pay | Admitting: Nurse Practitioner

## 2023-08-09 DIAGNOSIS — S42352A Displaced comminuted fracture of shaft of humerus, left arm, initial encounter for closed fracture: Secondary | ICD-10-CM

## 2023-08-09 DIAGNOSIS — M25512 Pain in left shoulder: Secondary | ICD-10-CM

## 2023-08-09 DIAGNOSIS — W19XXXA Unspecified fall, initial encounter: Secondary | ICD-10-CM

## 2023-08-09 LAB — COMPREHENSIVE METABOLIC PANEL
ALT: 11 U/L (ref 0–35)
AST: 11 U/L (ref 0–37)
Albumin: 3.7 g/dL (ref 3.5–5.2)
Alkaline Phosphatase: 58 U/L (ref 39–117)
BUN: 11 mg/dL (ref 6–23)
CO2: 35 meq/L — ABNORMAL HIGH (ref 19–32)
Calcium: 9.9 mg/dL (ref 8.4–10.5)
Chloride: 96 meq/L (ref 96–112)
Creatinine, Ser: 0.74 mg/dL (ref 0.40–1.20)
GFR: 94.83 mL/min (ref >60.00)
Glucose, Bld: 204 mg/dL — ABNORMAL HIGH (ref 70–99)
Potassium: 4.1 mEq/L (ref 3.5–5.1)
Sodium: 136 meq/L (ref 135–145)
Total Bilirubin: 0.3 mg/dL (ref 0.2–1.2)
Total Protein: 6.8 g/dL (ref 6.0–8.3)

## 2023-08-09 LAB — CBC
HCT: 30.6 % — ABNORMAL LOW (ref 36.0–46.0)
Hemoglobin: 9.2 g/dL — ABNORMAL LOW (ref 12.0–15.0)
MCHC: 30 g/dL (ref 30.0–36.0)
MCV: 74 fl — ABNORMAL LOW (ref 78.0–100.0)
Platelets: 428 10*3/uL — ABNORMAL HIGH (ref 150.0–400.0)
RBC: 4.14 Mil/uL (ref 3.87–5.11)
RDW: 25.1 % — ABNORMAL HIGH (ref 11.5–15.5)
WBC: 7.5 10*3/uL (ref 4.0–10.5)

## 2023-08-09 MED ORDER — TRAMADOL HCL 50 MG PO TABS
50.0000 mg | ORAL_TABLET | Freq: Two times a day (BID) | ORAL | 0 refills | Status: DC | PRN
Start: 2023-08-09 — End: 2023-08-20

## 2023-08-12 ENCOUNTER — Encounter: Payer: Self-pay | Admitting: Registered Nurse

## 2023-08-12 ENCOUNTER — Encounter (HOSPITAL_COMMUNITY): Payer: Self-pay

## 2023-08-12 ENCOUNTER — Other Ambulatory Visit: Payer: Self-pay | Admitting: Nurse Practitioner

## 2023-08-12 ENCOUNTER — Inpatient Hospital Stay (HOSPITAL_COMMUNITY)
Admission: EM | Admit: 2023-08-12 | Discharge: 2023-08-14 | DRG: 871 | Disposition: A | Discharge status: Hospice | Payer: BC Managed Care – PPO | Attending: Family Medicine | Admitting: Family Medicine

## 2023-08-12 ENCOUNTER — Encounter: Payer: BC Managed Care – PPO | Attending: Physical Medicine and Rehabilitation | Admitting: Registered Nurse

## 2023-08-12 ENCOUNTER — Emergency Department (HOSPITAL_COMMUNITY): Payer: BC Managed Care – PPO

## 2023-08-12 ENCOUNTER — Other Ambulatory Visit: Payer: Self-pay

## 2023-08-12 VITALS — BP 107/76 | HR 120 | Ht 67.0 in | Wt 196.0 lb

## 2023-08-12 DIAGNOSIS — E785 Hyperlipidemia, unspecified: Secondary | ICD-10-CM | POA: Diagnosis present

## 2023-08-12 DIAGNOSIS — Z515 Encounter for palliative care: Secondary | ICD-10-CM

## 2023-08-12 DIAGNOSIS — I69354 Hemiplegia and hemiparesis following cerebral infarction affecting left non-dominant side: Secondary | ICD-10-CM | POA: Diagnosis not present

## 2023-08-12 DIAGNOSIS — J986 Disorders of diaphragm: Secondary | ICD-10-CM | POA: Diagnosis present

## 2023-08-12 DIAGNOSIS — R0902 Hypoxemia: Secondary | ICD-10-CM

## 2023-08-12 DIAGNOSIS — G4733 Obstructive sleep apnea (adult) (pediatric): Secondary | ICD-10-CM | POA: Diagnosis not present

## 2023-08-12 DIAGNOSIS — F32A Depression, unspecified: Secondary | ICD-10-CM | POA: Diagnosis not present

## 2023-08-12 DIAGNOSIS — J9621 Acute and chronic respiratory failure with hypoxia: Secondary | ICD-10-CM | POA: Diagnosis not present

## 2023-08-12 DIAGNOSIS — R918 Other nonspecific abnormal finding of lung field: Secondary | ICD-10-CM | POA: Diagnosis not present

## 2023-08-12 DIAGNOSIS — Z7982 Long term (current) use of aspirin: Secondary | ICD-10-CM

## 2023-08-12 DIAGNOSIS — G894 Chronic pain syndrome: Secondary | ICD-10-CM

## 2023-08-12 DIAGNOSIS — I25118 Atherosclerotic heart disease of native coronary artery with other forms of angina pectoris: Secondary | ICD-10-CM | POA: Diagnosis not present

## 2023-08-12 DIAGNOSIS — I252 Old myocardial infarction: Secondary | ICD-10-CM

## 2023-08-12 DIAGNOSIS — Z7902 Long term (current) use of antithrombotics/antiplatelets: Secondary | ICD-10-CM

## 2023-08-12 DIAGNOSIS — Z9049 Acquired absence of other specified parts of digestive tract: Secondary | ICD-10-CM

## 2023-08-12 DIAGNOSIS — Z87891 Personal history of nicotine dependence: Secondary | ICD-10-CM

## 2023-08-12 DIAGNOSIS — I110 Hypertensive heart disease with heart failure: Secondary | ICD-10-CM | POA: Diagnosis present

## 2023-08-12 DIAGNOSIS — A419 Sepsis, unspecified organism: Secondary | ICD-10-CM | POA: Diagnosis not present

## 2023-08-12 DIAGNOSIS — M25512 Pain in left shoulder: Secondary | ICD-10-CM | POA: Diagnosis present

## 2023-08-12 DIAGNOSIS — Z95820 Peripheral vascular angioplasty status with implants and grafts: Secondary | ICD-10-CM

## 2023-08-12 DIAGNOSIS — Z5181 Encounter for therapeutic drug level monitoring: Secondary | ICD-10-CM

## 2023-08-12 DIAGNOSIS — R531 Weakness: Secondary | ICD-10-CM | POA: Diagnosis not present

## 2023-08-12 DIAGNOSIS — I2510 Atherosclerotic heart disease of native coronary artery without angina pectoris: Secondary | ICD-10-CM | POA: Diagnosis present

## 2023-08-12 DIAGNOSIS — W010XXA Fall on same level from slipping, tripping and stumbling without subsequent striking against object, initial encounter: Secondary | ICD-10-CM | POA: Diagnosis present

## 2023-08-12 DIAGNOSIS — F319 Bipolar disorder, unspecified: Secondary | ICD-10-CM | POA: Diagnosis not present

## 2023-08-12 DIAGNOSIS — G8194 Hemiplegia, unspecified affecting left nondominant side: Secondary | ICD-10-CM | POA: Diagnosis not present

## 2023-08-12 DIAGNOSIS — M7989 Other specified soft tissue disorders: Secondary | ICD-10-CM | POA: Diagnosis not present

## 2023-08-12 DIAGNOSIS — G9341 Metabolic encephalopathy: Secondary | ICD-10-CM | POA: Diagnosis present

## 2023-08-12 DIAGNOSIS — K219 Gastro-esophageal reflux disease without esophagitis: Secondary | ICD-10-CM | POA: Diagnosis not present

## 2023-08-12 DIAGNOSIS — S42212A Unspecified displaced fracture of surgical neck of left humerus, initial encounter for closed fracture: Secondary | ICD-10-CM | POA: Diagnosis present

## 2023-08-12 DIAGNOSIS — Z951 Presence of aortocoronary bypass graft: Secondary | ICD-10-CM

## 2023-08-12 DIAGNOSIS — R7989 Other specified abnormal findings of blood chemistry: Secondary | ICD-10-CM

## 2023-08-12 DIAGNOSIS — Z7952 Long term (current) use of systemic steroids: Secondary | ICD-10-CM

## 2023-08-12 DIAGNOSIS — N3000 Acute cystitis without hematuria: Secondary | ICD-10-CM

## 2023-08-12 DIAGNOSIS — I10 Essential (primary) hypertension: Secondary | ICD-10-CM | POA: Diagnosis not present

## 2023-08-12 DIAGNOSIS — J168 Pneumonia due to other specified infectious organisms: Secondary | ICD-10-CM | POA: Diagnosis not present

## 2023-08-12 DIAGNOSIS — N390 Urinary tract infection, site not specified: Secondary | ICD-10-CM | POA: Diagnosis present

## 2023-08-12 DIAGNOSIS — R410 Disorientation, unspecified: Secondary | ICD-10-CM

## 2023-08-12 DIAGNOSIS — I5032 Chronic diastolic (congestive) heart failure: Secondary | ICD-10-CM | POA: Diagnosis present

## 2023-08-12 DIAGNOSIS — I69398 Other sequelae of cerebral infarction: Secondary | ICD-10-CM | POA: Diagnosis not present

## 2023-08-12 DIAGNOSIS — G928 Other toxic encephalopathy: Secondary | ICD-10-CM | POA: Diagnosis present

## 2023-08-12 DIAGNOSIS — Z66 Do not resuscitate: Secondary | ICD-10-CM | POA: Diagnosis not present

## 2023-08-12 DIAGNOSIS — S42292A Other displaced fracture of upper end of left humerus, initial encounter for closed fracture: Secondary | ICD-10-CM | POA: Diagnosis not present

## 2023-08-12 DIAGNOSIS — R4182 Altered mental status, unspecified: Secondary | ICD-10-CM | POA: Diagnosis not present

## 2023-08-12 DIAGNOSIS — J440 Chronic obstructive pulmonary disease with acute lower respiratory infection: Secondary | ICD-10-CM | POA: Diagnosis present

## 2023-08-12 DIAGNOSIS — Z79899 Other long term (current) drug therapy: Secondary | ICD-10-CM

## 2023-08-12 DIAGNOSIS — G4739 Other sleep apnea: Secondary | ICD-10-CM | POA: Diagnosis present

## 2023-08-12 DIAGNOSIS — Z1152 Encounter for screening for COVID-19: Secondary | ICD-10-CM

## 2023-08-12 DIAGNOSIS — Y95 Nosocomial condition: Secondary | ICD-10-CM | POA: Diagnosis present

## 2023-08-12 DIAGNOSIS — I639 Cerebral infarction, unspecified: Secondary | ICD-10-CM | POA: Diagnosis not present

## 2023-08-12 DIAGNOSIS — R0989 Other specified symptoms and signs involving the circulatory and respiratory systems: Secondary | ICD-10-CM | POA: Diagnosis not present

## 2023-08-12 DIAGNOSIS — G43811 Other migraine, intractable, with status migrainosus: Secondary | ICD-10-CM

## 2023-08-12 DIAGNOSIS — Z7189 Other specified counseling: Secondary | ICD-10-CM | POA: Diagnosis not present

## 2023-08-12 DIAGNOSIS — E119 Type 2 diabetes mellitus without complications: Secondary | ICD-10-CM | POA: Diagnosis not present

## 2023-08-12 DIAGNOSIS — T50915A Adverse effect of multiple unspecified drugs, medicaments and biological substances, initial encounter: Secondary | ICD-10-CM | POA: Diagnosis present

## 2023-08-12 DIAGNOSIS — E1169 Type 2 diabetes mellitus with other specified complication: Secondary | ICD-10-CM | POA: Diagnosis present

## 2023-08-12 DIAGNOSIS — J9611 Chronic respiratory failure with hypoxia: Secondary | ICD-10-CM | POA: Diagnosis not present

## 2023-08-12 DIAGNOSIS — I517 Cardiomegaly: Secondary | ICD-10-CM | POA: Diagnosis not present

## 2023-08-12 DIAGNOSIS — G8104 Flaccid hemiplegia affecting left nondominant side: Secondary | ICD-10-CM | POA: Diagnosis not present

## 2023-08-12 DIAGNOSIS — I672 Cerebral atherosclerosis: Secondary | ICD-10-CM | POA: Diagnosis not present

## 2023-08-12 DIAGNOSIS — G44029 Chronic cluster headache, not intractable: Secondary | ICD-10-CM

## 2023-08-12 DIAGNOSIS — R000 Tachycardia, unspecified: Secondary | ICD-10-CM

## 2023-08-12 DIAGNOSIS — J189 Pneumonia, unspecified organism: Secondary | ICD-10-CM | POA: Diagnosis present

## 2023-08-12 DIAGNOSIS — I959 Hypotension, unspecified: Secondary | ICD-10-CM | POA: Diagnosis not present

## 2023-08-12 DIAGNOSIS — R5382 Chronic fatigue, unspecified: Secondary | ICD-10-CM | POA: Diagnosis present

## 2023-08-12 DIAGNOSIS — D649 Anemia, unspecified: Secondary | ICD-10-CM | POA: Diagnosis present

## 2023-08-12 DIAGNOSIS — E1151 Type 2 diabetes mellitus with diabetic peripheral angiopathy without gangrene: Secondary | ICD-10-CM | POA: Diagnosis not present

## 2023-08-12 DIAGNOSIS — I70202 Unspecified atherosclerosis of native arteries of extremities, left leg: Secondary | ICD-10-CM | POA: Diagnosis present

## 2023-08-12 DIAGNOSIS — F05 Delirium due to known physiological condition: Secondary | ICD-10-CM | POA: Diagnosis not present

## 2023-08-12 LAB — CBC
HCT: 31.6 % — ABNORMAL LOW (ref 36.0–46.0)
Hemoglobin: 9.4 g/dL — ABNORMAL LOW (ref 12.0–15.0)
MCH: 22.9 pg — ABNORMAL LOW (ref 26.0–34.0)
MCHC: 29.7 g/dL — ABNORMAL LOW (ref 30.0–36.0)
MCV: 77.1 fL — ABNORMAL LOW (ref 80.0–100.0)
Platelets: 509 10*3/uL — ABNORMAL HIGH (ref 150–400)
RBC: 4.1 MIL/uL (ref 3.87–5.11)
RDW: 23.5 % — ABNORMAL HIGH (ref 11.5–15.5)
WBC: 18.6 10*3/uL — ABNORMAL HIGH (ref 4.0–10.5)
nRBC: 0 % (ref 0.0–0.2)

## 2023-08-12 LAB — COMPREHENSIVE METABOLIC PANEL
ALT: 12 U/L (ref 0–44)
AST: 14 U/L — ABNORMAL LOW (ref 15–41)
Albumin: 3.1 g/dL — ABNORMAL LOW (ref 3.5–5.0)
Alkaline Phosphatase: 50 U/L (ref 38–126)
Anion gap: 13 (ref 5–15)
BUN: 10 mg/dL (ref 6–20)
CO2: 22 mmol/L (ref 22–32)
Calcium: 9.9 mg/dL (ref 8.9–10.3)
Chloride: 98 mmol/L (ref 98–111)
Creatinine, Ser: 0.85 mg/dL (ref 0.44–1.00)
GFR, Estimated: >60 mL/min (ref >60)
Glucose, Bld: 163 mg/dL — ABNORMAL HIGH (ref 70–99)
Potassium: 4.2 mmol/L (ref 3.5–5.1)
Sodium: 133 mmol/L — ABNORMAL LOW (ref 135–145)
Total Bilirubin: 0.6 mg/dL (ref 0.3–1.2)
Total Protein: 7.1 g/dL (ref 6.5–8.1)

## 2023-08-12 LAB — URINALYSIS, W/ REFLEX TO CULTURE (INFECTION SUSPECTED)
Bilirubin Urine: NEGATIVE
Glucose, UA: NEGATIVE mg/dL
Hgb urine dipstick: NEGATIVE
Ketones, ur: NEGATIVE mg/dL
Nitrite: NEGATIVE
Protein, ur: NEGATIVE mg/dL
Specific Gravity, Urine: 1.009 (ref 1.005–1.030)
pH: 7 (ref 5.0–8.0)

## 2023-08-12 LAB — HCG, SERUM, QUALITATIVE: Preg, Serum: NEGATIVE

## 2023-08-12 LAB — PROTIME-INR
INR: 1.1 (ref 0.8–1.2)
Prothrombin Time: 14.6 seconds (ref 11.4–15.2)

## 2023-08-12 LAB — I-STAT CG4 LACTIC ACID, ED: Lactic Acid, Venous: 1.2 mmol/L (ref 0.5–1.9)

## 2023-08-12 MED ORDER — OXYCODONE HCL 5 MG PO TABS
5.0000 mg | ORAL_TABLET | Freq: Once | ORAL | Status: AC
  Administered 2023-08-12: 5 mg via ORAL
  Filled 2023-08-12: qty 1

## 2023-08-12 MED ORDER — LACTATED RINGERS IV BOLUS
30.0000 mL/kg | Freq: Once | INTRAVENOUS | Status: AC
  Administered 2023-08-12: 2667 mL via INTRAVENOUS

## 2023-08-12 MED ORDER — FENTANYL CITRATE PF 50 MCG/ML IJ SOSY
50.0000 ug | PREFILLED_SYRINGE | Freq: Once | INTRAMUSCULAR | Status: AC
  Administered 2023-08-12: 50 ug via INTRAVENOUS
  Filled 2023-08-12: qty 1

## 2023-08-12 MED ORDER — ACETAMINOPHEN 325 MG PO TABS
650.0000 mg | ORAL_TABLET | Freq: Once | ORAL | Status: AC | PRN
  Administered 2023-08-12: 650 mg via ORAL
  Filled 2023-08-12: qty 2

## 2023-08-12 MED ORDER — VANCOMYCIN HCL 1750 MG/350ML IV SOLN
1750.0000 mg | Freq: Once | INTRAVENOUS | Status: AC
  Administered 2023-08-12: 1750 mg via INTRAVENOUS
  Filled 2023-08-12: qty 350

## 2023-08-12 MED ORDER — SODIUM CHLORIDE 0.9 % IV SOLN
2.0000 g | Freq: Once | INTRAVENOUS | Status: AC
  Administered 2023-08-12: 2 g via INTRAVENOUS
  Filled 2023-08-12: qty 12.5

## 2023-08-12 NOTE — ED Notes (Signed)
Patient is speaking with MD.

## 2023-08-12 NOTE — Subjective & Objective (Signed)
Patient was brought in secondary to acute on chronic weakness and confusion worsening over past 2 weeks At baseline has left-sided paralysis history of stroke and CABG

## 2023-08-12 NOTE — ED Notes (Signed)
Assumed care of patient. Patient is resting in bed. Husband is at bedside.

## 2023-08-12 NOTE — H&P (Signed)
Erika [name]:[id number] DOB: [birth year] DOA: 08/12/2023     PCP: Eden Emms, NP   Outpatient Specialists:   CARDS:  Dr. Lorine Bears, MD    Patient arrived to ER on 08/12/23 at 1659 Referred by Attending Erika Grandchild, MD   Patient coming from:    home Lives With family    Chief Complaint: Worsening fatigue HPI: Erika [name] is a 52 y.o. female with medical history significant of hemiplegia and hemiparesis affecting the diaphragm multiple cerebral infarcts chronic respiratory failure at baseline on 4 L, hypertension hyperlipidemia, history of CABG and and enterectomy status post to monitor thousand 17, stroke with left-sided deficit, obesity sleep apnea, PAD PNA PTSD diabetes type 2 patient is on BiPAP at night chronic aspiration recurrent sepsis    Presented with acute on chronic fatigue Patient was brought in secondary to acute on chronic weakness and confusion worsening over past 2 weeks At baseline has left-sided paralysis history of stroke and CABG    Just admitted to [facility] history of septic shock requiring pressors and was discharged on 9 August patient that time was made DO NOT RESUSCITATE  Now on 6L of oxygen and desats if her o2 is turned down She noted to be hypotensive  Has been more confused  Denies localized neurolgical deficits that are new Has been fibrile    Denies significant ETOH intake   Does not smoke   Lab Results  Component Value Date   SARSCOV2NAA NEGATIVE 07/30/2023   SARSCOV2NAA NEGATIVE 07/16/2023   SARSCOV2NAA POSITIVE (A) 01/31/2022   SARSCOV2NAA NEGATIVE 01/26/2022       Regarding pertinent Chronic problems:     Hyperlipidemia - on repatha Lipid Panel     Component Value Date/Time   CHOL 93 05/25/2022 1032   CHOL 121 08/21/2016 0817   TRIG 182.0 (H) 05/25/2022 1032   HDL 41.80 05/25/2022 1032   HDL 24 (L) 08/21/2016 0817   CHOLHDL 2 05/25/2022 1032   VLDL 36.4 05/25/2022 1032   LDLCALC 15  05/25/2022 1032   LDLCALC 64 08/21/2016 0817   LDLDIRECT 99.6 (H) 06/25/2021 0740   LABVLDL 33 08/21/2016 0817     HTN on metoprolol   chronic CHF diastolic  last echo  Recent Results (from the past 81191 hour(s))  ECHOCARDIOGRAM COMPLETE   Collection Time: 01/27/22 10:00 AM  Result Value   Weight 3,616   Height 68   BP 104/52   Ao pk vel 1.47   AR max vel 2.30   AV Peak grad 8.6   Single Plane A2C EF 69.4   Single Plane A4C EF 83.1   Calc EF 74.6   S' Lateral 3.20   Area-P 1/2 4.15   Narrative      ECHOCARDIOGRAM REPORT          1. Left ventricular ejection fraction, by estimation, is 60 to 65%. The left ventricle has normal function. The left ventricle has no regional wall motion abnormalities. There is mild left ventricular hypertrophy. Left ventricular diastolic parameters  are consistent with Grade II diastolic dysfunction (pseudonormalization).  2. Right ventricular systolic function is normal. The right ventricular size is normal.  3. The mitral valve is normal in structure. Mild mitral valve regurgitation.  4. The aortic valve is tricuspid. Aortic valve regurgitation is not visualized.  5. The inferior vena cava is dilated in size with <50% respiratory variability, suggesting right atrial pressure of 15 mmHg.  CAD  - On Aspirin, repatha, betablocker, Plavix                 - followed by cardiology                 DM 2 -  Lab Results  Component Value Date   HGBA1C 6.2 (H) 07/26/2023   on  Mounjaro      OSA -on bipap    Hx of CVA -  with residual deficits on Aspirin 81 mg,  Plavix      While in ER:    New infiltrates started on cefepime and vnac    Lab Orders         Culture, blood (Routine x 2)         Comprehensive metabolic panel         CBC         hCG, serum, qualitative         Protime-INR         Urinalysis, w/ Reflex to Culture (Infection Suspected) -Urine, Clean Catch         CBG monitoring, ED       CXR - Very low lung  volumes with bibasilar atelectasis or infiltrates.   Cardiomegaly.   Ct  Airspace opacities in the mid and lower lungs, most confluent in the lower lobes. Opacities have improved since prior study compatible with residual but improving pneumonia.  Following Medications were ordered in ER: Medications  vancomycin (VANCOREADY) IVPB 1750 mg/350 mL (1,750 mg Intravenous New Bag/Given 08/12/23 2206)  acetaminophen (TYLENOL) tablet 650 mg (650 mg Oral Given 08/12/23 1812)  ceFEPIme (MAXIPIME) 2 g in sodium chloride 0.9 % 100 mL IVPB (0 g Intravenous Stopped 08/12/23 2202)  lactated ringers bolus 2,667 mL (2,667 mLs Intravenous New Bag/Given 08/12/23 2128)       ED Triage Vitals  Encounter Vitals Group     BP 08/12/23 1717 108/75     Systolic BP Percentile --      Diastolic BP Percentile --      Pulse Rate 08/12/23 1717 (!) 118     Resp 08/12/23 1717 20     Temp 08/12/23 1717 (!) 102.8 F (39.3 C)     Temp Source 08/12/23 1717 Oral     SpO2 08/12/23 1717 94 %     Weight 08/12/23 1714 195 lb 15.8 oz (88.9 kg)     Height 08/12/23 1714 5\' 7"  (1.702 m)     Head Circumference --      Peak Flow --      Pain Score 08/12/23 1709 0     Pain Loc --      Pain Education --      Exclude from Growth Chart --   NGEX(52)@     _________________________________________ Significant initial  Findings: Abnormal Labs Reviewed  COMPREHENSIVE METABOLIC PANEL - Abnormal; Notable for the following components:      Result Value   Sodium 133 (*)    Glucose, Bld 163 (*)    Albumin 3.1 (*)    AST 14 (*)    All other components within normal limits  CBC - Abnormal; Notable for the following components:   WBC 18.6 (*)    Hemoglobin 9.4 (*)    HCT 31.6 (*)    MCV 77.1 (*)    MCH 22.9 (*)    MCHC 29.7 (*)    RDW 23.5 (*)    Platelets 509 (*)    All other  components within normal limits  URINALYSIS, W/ REFLEX TO CULTURE (INFECTION SUSPECTED) - Abnormal; Notable for the following components:    Leukocytes,Ua MODERATE (*)    Bacteria, UA RARE (*)    All other components within normal limits       ECG: Ordered Personally reviewed and interpreted by me showing: HR : 120 Rhythm: Sinus tachycardia Septal infarct , age undetermined Abnormal ECG When compared QTC 452  BNP (last 3 results) Recent Labs    07/03/23 1125 07/16/23 2058 07/30/23 0838  BNP 45.7 43.9 90.1     COVID-19 Labs  No results for input(s): "DDIMER", "FERRITIN", "LDH", "CRP" in the last 72 hours.  Lab Results  Component Value Date   SARSCOV2NAA NEGATIVE 07/30/2023   SARSCOV2NAA NEGATIVE 07/16/2023   SARSCOV2NAA POSITIVE (A) 01/31/2022   SARSCOV2NAA NEGATIVE 01/26/2022    ________ This patient meets SIRS Criteria and may be septic.    The recent clinical data is shown below. Vitals:   08/12/23 1717 08/12/23 2007 08/12/23 2051 08/12/23 2208  BP: 108/75 121/70 110/64 (!) 99/58  Pulse: (!) 118 (!) 105 99 88  Resp: 20  15 15   Temp: (!) 102.8 F (39.3 C) 99.6 F (37.6 C) 99.4 F (37.4 C) 98.4 F (36.9 C)  TempSrc: Oral Oral Oral Oral  SpO2: 94% 92% 96% 96%  Weight:      Height:        WBC     Component Value Date/Time   WBC 18.6 (H) 08/12/2023 1759   LYMPHSABS 1.7 07/16/2023 1836   LYMPHSABS 2.6 09/11/2021 1413   LYMPHSABS 3.4 01/11/2014 1312   MONOABS 0.6 07/16/2023 1836   MONOABS 0.7 01/11/2014 1312   EOSABS 0.0 07/16/2023 1836   EOSABS 0.2 09/11/2021 1413   EOSABS 0.3 01/11/2014 1312   BASOSABS 0.1 07/16/2023 1836   BASOSABS 0.0 09/11/2021 1413   BASOSABS 0.1 01/11/2014 1312    Lactic Acid, Venous    Component Value Date/Time   LATICACIDVEN 1.2 08/12/2023 1812    Procalcitonin   Ordered      UA  evidence of UTI  Urine analysis:    Component Value Date/Time   COLORURINE YELLOW 08/12/2023 2200   APPEARANCEUR CLEAR 08/12/2023 2200   APPEARANCEUR Clear 09/11/2021 1413   LABSPEC 1.009 08/12/2023 2200   LABSPEC 1.024 01/11/2015 1022   PHURINE 7.0 08/12/2023 2200    GLUCOSEU NEGATIVE 08/12/2023 2200   GLUCOSEU >=1000 (A) 10/19/2019 1034   HGBUR NEGATIVE 08/12/2023 2200   BILIRUBINUR NEGATIVE 08/12/2023 2200   BILIRUBINUR Negative 09/11/2021 1413   BILIRUBINUR Negative 01/11/2015 1022   KETONESUR NEGATIVE 08/12/2023 2200   PROTEINUR NEGATIVE 08/12/2023 2200   UROBILINOGEN 1.0 10/19/2019 1049   UROBILINOGEN 1.0 10/19/2019 1034   NITRITE NEGATIVE 08/12/2023 2200   LEUKOCYTESUR MODERATE (A) 08/12/2023 2200   LEUKOCYTESUR Negative 01/11/2015 1022    Results for orders placed or performed during the hospital encounter of 07/30/23  Blood Culture (routine x 2)     Status: None   Collection Time: 07/30/23  9:12 AM   Specimen: BLOOD  Result Value Ref Range Status   Specimen Description BLOOD LEFT ANTECUBITAL  Final   Special Requests   Final    BOTTLES DRAWN AEROBIC AND ANAEROBIC Blood Culture adequate volume   Culture   Final    NO GROWTH 5 DAYS Performed at [facility], [address], [place], [place] [address]    Report Status 08/04/2023 FINAL  Final  Blood Culture (routine x 2)  Status: None   Collection Time: 07/30/23  9:12 AM   Specimen: BLOOD  Result Value Ref Range Status   Specimen Description BLOOD RIGHT ANTECUBITAL  Final   Special Requests   Final    BOTTLES DRAWN AEROBIC AND ANAEROBIC Blood Culture adequate volume   Culture   Final    NO GROWTH 5 DAYS Performed at [facility], [address] [place], [place] [address]    Report Status 08/04/2023 FINAL  Final  SARS Coronavirus 2 by RT PCR (hospital order, performed in [facility] hospital lab) *cepheid single result test* Anterior Nasal Swab     Status: None   Collection Time: 07/30/23  9:39 AM   Specimen: Anterior Nasal Swab  Result Value Ref Range Status   SARS Coronavirus 2 by RT PCR NEGATIVE NEGATIVE Final        MRSA Next Gen by PCR, Nasal     Status: None   Collection Time: 07/30/23  1:40 PM   Specimen: Nasal Mucosa; Nasal Swab  Result Value Ref  Range Status   MRSA by PCR Next Gen NOT DETECTED NOT DETECTED Final           ABX started Antibiotics Given (last 72 hours)     Date/Time Action Medication Dose Rate   08/12/23 2129 New Bag/Given   ceFEPIme (MAXIPIME) 2 g in sodium chloride 0.9 % 100 mL IVPB 2 g 200 mL/hr   08/12/23 2206 New Bag/Given   vancomycin (VANCOREADY) IVPB 1750 mg/350 mL 1,750 mg 175 mL/hr       No results found for the last 90 days.     ABG    Component Value Date/Time   PHART 7.392 07/03/2021 1152   PCO2ART 45.1 07/03/2021 1152   PO2ART 55.8 (L) 07/03/2021 1152   HCO3 33.1 (H) 07/30/2023 0927   TCO2 29 08/19/2019 1145   ACIDBASEDEF 1.0 07/06/2016 2254   O2SAT 77.7 07/30/2023 0927    __________________________________________________________ Recent Labs  Lab 08/08/23 1520 08/12/23 1759  NA 136 133*  K 4.1 4.2  CO2 35* 22  GLUCOSE 204* 163*  BUN 11 10  CREATININE 0.74 0.85  CALCIUM 9.9 9.9    Cr   stable,   Lab Results  Component Value Date   CREATININE 0.85 08/12/2023   CREATININE 0.74 08/08/2023   CREATININE 0.73 08/01/2023    Recent Labs  Lab 08/08/23 1520 08/12/23 1759  AST 11 14*  ALT 11 12  ALKPHOS 58 50  BILITOT 0.3 0.6  PROT 6.8 7.1  ALBUMIN 3.7 3.1*   Lab Results  Component Value Date   CALCIUM 9.9 08/12/2023   PHOS 3.8 12/24/2021        Plt: Lab Results  Component Value Date   PLT 509 (H) 08/12/2023       Recent Labs  Lab 08/08/23 1520 08/12/23 1759  WBC 7.5 18.6*  HGB 9.2* 9.4*  HCT 30.6* 31.6*  MCV 74.0* 77.1*  PLT 428.0* 509*    HG/HCT stable,     Component Value Date/Time   HGB 9.4 (L) 08/12/2023 1759   HGB 14.2 09/11/2021 1413   HCT 31.6 (L) 08/12/2023 1759   HCT 43.5 09/11/2021 1413   MCV 77.1 (L) 08/12/2023 1759   MCV 89 09/11/2021 1413   MCV 89 01/11/2015 1022    ________________________ Hospitalist was called for admission for acute encephalopathy, sepsis   The following Work up has been ordered so far:  Orders Placed  This Encounter  Procedures   Culture,  blood (Routine x 2)   DG Chest 2 View   Comprehensive metabolic panel   CBC   hCG, serum, qualitative   Protime-INR   Urinalysis, w/ Reflex to Culture (Infection Suspected) -Urine, Clean Catch   Diet NPO time specified   Document Height and Actual Weight   Neuro checks q 2 hours x12 hours   Document height and weight   Consult for Unassigned Medical Admission   Consult to hospitalist   CBG monitoring, ED     OTHER Significant initial  Findings:  labs showing:     DM  labs:  HbA1C: Recent Labs    11/30/22 1452 03/05/23 0950 07/26/23 1628  HGBA1C 11.8* 8.2* 6.2*       CBG (last 3)  No results for input(s): "GLUCAP" in the last 72 hours.        Cultures:    Component Value Date/Time   SDES BLOOD LEFT ANTECUBITAL 07/30/2023 0912   SDES BLOOD RIGHT ANTECUBITAL 07/30/2023 0912   SPECREQUEST  07/30/2023 0912    BOTTLES DRAWN AEROBIC AND ANAEROBIC Blood Culture adequate volume   SPECREQUEST  07/30/2023 0912    BOTTLES DRAWN AEROBIC AND ANAEROBIC Blood Culture adequate volume   CULT  07/30/2023 0912    NO GROWTH 5 DAYS Performed at [facility], [address] [place] [place], [place] [address]    CULT  07/30/2023 0912    NO GROWTH 5 DAYS Performed at [facility], [address] [place], [place] [address]    REPTSTATUS 08/04/2023 FINAL 07/30/2023 0912   REPTSTATUS 08/04/2023 FINAL 07/30/2023 0912     Radiological Exams on Admission: DG Chest 2 View  Result Date: 08/12/2023 CLINICAL DATA:  Altered mental status.  Suspected sepsis. EXAM: CHEST - 2 VIEW COMPARISON:  07/30/2023 year.  CT 08/08/2023 FINDINGS: Prior CABG. Cardiomegaly. Low lung volumes. Bilateral lower lobe airspace opacities are noted. No visible effusions. No acute bony abnormality. IMPRESSION: Very low lung volumes with bibasilar atelectasis or infiltrates. Cardiomegaly. Electronically Signed   By: Charlett Nose M.D.   On: 08/12/2023 20:12    _______________________________________________________________________________________________________ Latest  Blood pressure (!) 99/58, pulse 88, temperature 98.4 F (36.9 C), temperature source Oral, resp. rate 15, height 5\' 7"  (1.702 m), weight 88.9 kg, SpO2 96%.   Vitals  labs and radiology finding personally reviewed  Review of Systems:    Pertinent positives include:   fatigue,shortness of breath at rest  Constitutional:  No weight loss, night sweats, Fevers, chills, weight loss  HEENT:  No headaches, Difficulty swallowing,Tooth/dental problems,Sore throat,  No sneezing, itching, ear ache, nasal congestion, post nasal drip,  Cardio-vascular:  No chest pain, Orthopnea, PND, anasarca, dizziness, palpitations.no Bilateral lower extremity swelling  GI:  No heartburn, indigestion, abdominal pain, nausea, vomiting, diarrhea, change in bowel habits, loss of appetite, melena, blood in stool, hematemesis Resp:  no . No dyspnea on exertion, No excess mucus, no productive cough, No non-productive cough, No coughing up of blood.No change in color of mucus.No wheezing. Skin:  no rash or lesions. No jaundice GU:  no dysuria, change in color of urine, no urgency or frequency. No straining to urinate.  No flank pain.  Musculoskeletal:  No joint pain or no joint swelling. No decreased range of motion. No back pain.  Psych:  No change in mood or affect. No depression or anxiety. No memory loss.  Neuro: no localizing neurological complaints, no tingling, no weakness, no double vision, no gait abnormality, no slurred speech, no confusion  All systems  reviewed and apart from HOPI all are negative _______________________________________________________________________________________________ Past Medical History:   Past Medical History:  Diagnosis Date   Acute respiratory failure with hypoxia (HCC) 06/24/2021   Arthralgia of temporomandibular joint    CAD, multiple vessel    a. 06/2016  Cath: ostLM 40%, ostLAD 40%, pLAD 95%, ost-pLCx 60%, pLCx 95%, mLCx 60%, mRCA 95%, D2 50%, LVSF nl;  b. 07/2016 CABG x 4 (LIMA->LAD, VG->Diag, VG->OM, VG->RCA); c. 08/2016 Cath: 3VD w/ 4/4 patent grafts. LAD distal to LIMA has diff dzs->Med rx; d. 08/2020 Cath: 4/4 patent grafts, native 3VD. EF 55-65%-->Med Rx.   Carotid arterial disease (HCC)    a. 07/2016 s/p R CEA; b. 02/2021 U/S: RICA 40-59%, LICA 1-39%.   Cerebrovascular disease    Clotting disorder (HCC)    Complex sleep apnea syndrome 10/03/2022   Depression    Diastolic dysfunction    a. 06/2016 Echo: EF 50-55%, mild inf wall HK, GR1DD, mild MR, RV sys fxn nl, mildly dilated LA, PASP nl; b. 06/2021 Echo: EF 60-65%, no rwma. Nl RV fxn.   Essential hypertension    Essential hypertension, malignant 06/28/2016   Fatty liver disease, nonalcoholic 2016   History of blood transfusion    with heart surgery   History of MI (myocardial infarction) (July 2017) 08/02/2020   HLD (hyperlipidemia)    HTN (hypertension), malignant 10/20/2013   Labile hypertension    a. prior renal ngiogram negative for RAS in 03/2016; b. catecholamines and metanephrines normal, mildly elevated renin with normal aldosterone and normal ratio in 02/2016   Mild tobacco abuse in early remission 06/28/2016   Myocardial infarction [facility]) 2017   Obesity    PAD (peripheral artery disease) (HCC)    a. 09/2018 s/p L SFA stenting; b. 07/2019 Periph Angio: Patent m/d L SFA stent w/ 100% L SFA distal to stent. L AT 100d, L Peroneal diff dzs-->Med Rx; c. 02/2021 ABIs: stable @ 0.61 on R and 0.46 on L.   PTSD (post-traumatic stress disorder)    Reflux gastritis 07/01/2023   Stroke (HCC)    Tobacco abuse    Type 2 diabetes mellitus (HCC) 12/2015      Past Surgical History:  Procedure Laterality Date   ABDOMINAL AORTOGRAM W/LOWER EXTREMITY [name] 10/15/2018   Procedure: ABDOMINAL AORTOGRAM W/LOWER EXTREMITY;  Surgeon: Iran Ouch, MD;  Location: [facility];  Service:  Cardiovascular;  Laterality: [name];   ABDOMINAL AORTOGRAM W/LOWER EXTREMITY Bilateral 08/19/2019   Procedure: ABDOMINAL AORTOGRAM W/LOWER EXTREMITY;  Surgeon: Iran Ouch, MD;  Location: [facility];  Service: Cardiovascular;  Laterality: Bilateral;   CARDIAC CATHETERIZATION [name] 06/29/2016   Procedure: Left Heart Cath and Coronary Angiography;  Surgeon: Antonieta Iba, MD;  Location: [facility] INV[facility]IVE CV [facility];  Service: Cardiovascular;  Laterality: [name];   CARDIAC CATHETERIZATION [name] 08/29/2016   Procedure: Left Heart Cath and Cors/Grafts Angiography;  Surgeon: Iran Ouch, MD;  Location: [facility];  Service: Cardiovascular;  Laterality: [name];   CESAREAN SECTION     CHOLECYSTECTOMY     CORONARY ARTERY BYPASS GRAFT [name] 07/06/2016   Procedure: CORONARY ARTERY BYPASS GRAFTING (CABG) x four, using left internal mammary artery and right leg greater saphenous vein harvested endoscopically;  Surgeon: Kerin Perna, MD;  Location: [facility] OR;  Service: Open Heart Surgery;  Laterality: [name];   ENDARTERECTOMY Right 07/06/2016   Procedure: ENDARTERECTOMY CAROTID;  Surgeon: Larina Earthly, MD;  Location: [facility] OR;  Service: Vascular;  Laterality: Right;  ENDARTERECTOMY Right 04/27/2020   Procedure: REDO OF RIGHT ENDARTERECTOMY CAROTID;  Surgeon: Larina Earthly, MD;  Location: [facility] OR;  Service: Vascular;  Laterality: Right;   LEFT HEART CATH AND CORS/GRAFTS ANGIOGRAPHY [name] 08/24/2020   Procedure: LEFT HEART CATH AND CORS/GRAFTS ANGIOGRAPHY;  Surgeon: Iran Ouch, MD;  Location: [facility];  Service: Cardiovascular;  Laterality: [name];   LEFT HEART CATH AND CORS/GRAFTS ANGIOGRAPHY [name] 01/29/2022   Procedure: LEFT HEART CATH AND CORS/GRAFTS ANGIOGRAPHY;  Surgeon: Iran Ouch, MD;  Location: [facility] CV [facility];  Service: Cardiovascular;  Laterality: [name];   PERIPHERAL VASCULAR CATHETERIZATION [name] 04/18/2016   Procedure: Renal Angiography;  Surgeon: Iran Ouch, MD;  Location: [facility];  Service: Cardiovascular;  Laterality: [name];   PERIPHERAL VASCULAR INTERVENTION Left 10/15/2018   Procedure: PERIPHERAL VASCULAR INTERVENTION;  Surgeon: Iran Ouch, MD;  Location: [facility];  Service: Cardiovascular;  Laterality: Left;  Left superficial femoral   TEE WITHOUT CARDIOVERSION [name] 07/06/2016   Procedure: TRANSESOPHAGEAL ECHOCARDIOGRAM (TEE);  Surgeon: Kerin Perna, MD;  Location: [facility] OR;  Service: Open Heart Surgery;  Laterality: [name];   TONSILLECTOMY      Social History:  Ambulatory   walker      reports that she quit smoking about 2 years ago. Her smoking use included cigarettes. She started smoking about 32 years ago. She has a 15 pack-year smoking history. She has never been exposed to tobacco smoke. She has never used smokeless tobacco. She reports that she does not currently use alcohol. She reports that she does not use drugs.   Family History:   Family History  Adopted: Yes  Problem Relation Age of Onset   Diabetes Mother    Diabetes Father    Alcohol abuse Father    Heart disease Father    Drug abuse Father    Stroke Sister    Anxiety disorder Sister    ______________________________________________________________________________________________ Allergies: No Known Allergies   Prior to Admission medications   Medication Sig Start Date End Date Taking? Authorizing Provider  acetaminophen (TYLENOL) 325 MG tablet Take 1-2 tablets (325-650 mg total) by mouth every 4 (four) hours as needed for mild pain. 08/01/21   Love, Evlyn Kanner, PA-C  albuterol (PROVENTIL) (2.5 MG/3ML) 0.083% nebulizer solution Take 3 mLs (2.5 mg total) by nebulization every 4 (four) hours as needed for wheezing or shortness of breath. 07/05/23 08/04/23  Enedina Finner, MD  amitriptyline (ELAVIL) 10 MG tablet Take 1 tablet (10 mg total) by mouth at bedtime. 07/23/23   Raulkar, Drema Pry, MD  aspirin 81 MG chewable tablet Chew 1 tablet (81 mg total) by mouth daily. 08/02/23   Sunnie Nielsen, DO  chlorproMAZINE (THORAZINE) 25 MG tablet Take 3 tablets (75 mg total) by mouth at bedtime. 07/08/23   Arfeen, Phillips Grout, MD  clonazePAM (KLONOPIN) 0.5 MG tablet Take 0.5 tablets (0.25 mg total) by mouth 2 (two) times daily. Take 1/2 tab twice daily. No dissolving tablets. 07/08/23   Arfeen, Phillips Grout, MD  clopidogrel (PLAVIX) 75 MG tablet Take 1 tablet (75 mg total) by mouth daily. 03/06/23   Sater, Pearletha Furl, MD  Continuous Glucose Sensor (DEXCOM G7 SENSOR) MISC APPLY ONE SENSOR TO THE BACK OF YOUR UPPER ARM. REPLACE EVERY 10 DAYS. 04/12/23   Eden Emms, NP  cyclobenzaprine (FLEXERIL) 10 MG tablet TAKE ONE TABLET BY MOUTH AT BEDTIME 06/26/23   Raulkar, Drema Pry, MD  escitalopram (LEXAPRO) 20 MG tablet Take  1 tablet (20 mg total) by mouth daily. 07/08/23 10/06/23  Arfeen, Phillips Grout, MD  Evolocumab (REPATHA SURECLICK) 140 MG/ML SOAJ Inject 140 mg into the skin as directed. Inject 1 Dose into the skin every 14 (fourteen) days. 10/31/22   Furth, Cadence H, PA-C  ferrous sulfate 324 (65 Fe) MG TBEC Take 1 tablet (324 mg total) by mouth 2 (two) times daily. 07/16/23   Iran Ouch, MD  fludrocortisone (FLORINEF) 0.1 MG tablet Take 1 tablet (0.1 mg total) by mouth daily. 07/16/23   Iran Ouch, MD  icosapent Ethyl (VASCEPA) 1 g capsule Take 2 g by mouth 2 (two) times daily.    [provider]  lamoTRIgine (LAMICTAL) 200 MG tablet Take 1 tablet (200 mg total) by mouth at bedtime. 07/08/23   Arfeen, Phillips Grout, MD  lamoTRIgine (LAMICTAL) 25 MG tablet Take 1 tablet (25 mg total) by mouth daily. Patient taking differently: Take 25 mg by mouth at bedtime. 07/08/23 10/06/23  Arfeen, Phillips Grout, MD  metoprolol succinate (TOPROL-XL) 25 MG 24 hr tablet Take 1 tablet (25 mg total) by mouth daily. 07/16/23   Iran Ouch, MD  MOUNJARO 7.5 MG/0.5ML Pen INJECT THE CONTENTS OF ONE PEN UNDER THE SKIN WEEKLY ON THE SAME DAY EACH WEEK 06/21/23   Eden Emms, NP  nitroGLYCERIN (NITROSTAT) 0.4 MG SL tablet  Place 1 tablet (0.4 mg total) under the tongue every 5 (five) minutes as needed for chest pain. 03/15/21   Iran Ouch, MD  Nutritional Supplements (FEEDING SUPPLEMENT, NEPRO CARB STEADY,) LIQD Take 237 mLs by mouth 3 (three) times daily between meals. 08/02/23   Sunnie Nielsen, DO  oxyCODONE (ROXICODONE) 15 MG immediate release tablet Take 1 tablet (15 mg total) by mouth 2 (two) times daily as needed for pain. 07/23/23   Raulkar, Drema Pry, MD  pantoprazole (PROTONIX) 40 MG tablet TAKE ONE TABLET BY MOUTH ONE TIME DAILY 07/24/23   Raulkar, Drema Pry, MD  pregabalin (LYRICA) 225 MG capsule Take 1 capsule (225 mg total) by mouth 2 (two) times daily. 04/11/23   Jones Bales, NP  Tiotropium Bromide-Olodaterol (STIOLTO RESPIMAT) 2.5-2.5 MCG/ACT AERS Inhale 2 puffs into the lungs daily. 06/24/23   Glenford Bayley, NP  Tiotropium Bromide-Olodaterol (STIOLTO RESPIMAT) 2.5-2.5 MCG/ACT AERS Inhale 2 puffs into the lungs daily. 08/02/23   Sunnie Nielsen, DO  topiramate (TOPAMAX) 25 MG tablet Take 1 tablet (25 mg total) by mouth at bedtime. 05/16/23   Raulkar, Drema Pry, MD  traMADol (ULTRAM) 50 MG tablet Take 1 tablet (50 mg total) by mouth 2 (two) times daily as needed. 08/09/23   Eden Emms, NP  Stann Ore MINI PEN NEEDLES 31G X 6 MM MISC USE PEN NEEDLES TWO TIMES DAILY 12/26/22   Eden Emms, NP    ___________________________________________________________________________________________________ Physical Exam:    08/12/2023   10:08 PM 08/12/2023    8:51 PM 08/12/2023    8:07 PM  Vitals with BMI  Systolic 99 110 121  Diastolic 58 64 70  Pulse 88 99 105     1. General:  in No  Acute distress   Chronically ill  -appearing 2. Psychological: Alert and   Oriented 3. Head/ENT:    Dry Mucous Membranes                          Head Non traumatic, neck supple  Poor Dentition 4. SKIN:  decreased Skin turgor,  Skin clean Dry and intact no rash    5. Heart: Regular  rate and rhythm no  Murmur, no Rub or gallop 6. Lungs:  no wheezes or crackles   7. Abdomen: Soft,  non-tender, Non distended   obese  bowel sounds present 8. Lower extremities: no clubbing, cyanosis, no  edema 9. Neurologically left side weakness, unequal pupils RIGHT >left pt w hx of recurrent CVA 10. MSK: Normal range of motion    Chart has been reviewed  ______________________________________________________________________________________________  Assessment/Plan 52 y.o. female with medical history significant of hemiplegia and hemiparesis affecting the diaphragm multiple cerebral infarcts chronic respiratory failure at baseline on 4 L, hypertension hyperlipidemia, history of CABG and and enterectomy status post to monitor thousand 17, stroke with left-sided deficit, obesity sleep apnea, PAD PNA PTSD diabetes type 2 patient is on BiPAP at night chronic aspiration recurrent sepsis  Admitted for sepsis HCAP, UTI   Present on Admission:  HCAP (healthcare-associated pneumonia)  Anemia  Chronic diastolic CHF (congestive heart failure) (HCC)  Type 2 diabetes mellitus with other specified complication (HCC)  Acute metabolic encephalopathy/delirium/polypharmacy  Acute on chronic respiratory failure with hypoxia (HCC)  OSA (obstructive sleep apnea)  CAD (coronary artery disease)     Anemia Chronic stable obtain anemia panel in a.m.  Chronic diastolic CHF (congestive heart failure) (HCC) Gently rehydrate currently appears to be more on the dry side.  Continue to monitor  Hemiplegia and hemiparesis following cerebral infarction affecting left non-dominant side (HCC) Chronic stable patient is overall have very poor prognosis palliative care consult  Type 2 diabetes mellitus with other specified complication (HCC) Order sliding scale  HCAP (healthcare-associated pneumonia) Continue cefepime and Vanco for now.  Patient with worsening respiratory failure  Acute metabolic  encephalopathy/delirium/polypharmacy Acute on chronic in the setting of polysubstance.  Possibly chronic worsening infection and hypoxia. Discussed with patient comfort versus continuation of her home medications will hold off on some of sedation And try to avoid long-lasting narcotics if possible Appreciate palliative care consult to discuss overall goals of therapy and care  Acute on chronic respiratory failure with hypoxia (HCC) Patient oxygen demand has been up now to 6 L. Evidence of persistent infiltrates Resume antibiotic coverage Provide oxygen as needed  OSA (obstructive sleep apnea) Resume BiPAP  CAD (coronary artery disease) Resume aspirin and Plavix   Other plan as per orders.  DVT prophylaxis:  SCD     Code Status:   DNR/DNI  as per patient   I had personally discussed CODE STATUS with patient   ACP  has been reviewed     Family Communication:   Family not at  Bedside    Diet daibetic diet Diet Orders (From admission, onward)     Start     Ordered   08/12/23 1717  Diet NPO time specified  Diet effective now        08/12/23 1717            Disposition Plan:      To home once workup is complete and patient is stable   Following barriers for discharge:                                                       Electrolytes corrected  Anemia  stable                                                           Afebrile, white count improving able to transition to PO antibiotics        Consult Orders  (From admission, onward)           Start     Ordered   08/12/23 2207  Consult to hospitalist  Pg by Viviann Spare  Once       Provider:  (Not yet assigned)  Question Answer Comment  Place call to: [facility]   Reason for Consult Admit      08/12/23 2206                                        Palliative care    consulted                                 Consults called:    none   Admission status:  ED  Disposition     ED Disposition  Admit   Condition  --   Comment  Hospital Area: [facility] [[id number]]  Level of Care: Progressive [102]  Admit to Progressive based on following criteria: MULTISYSTEM THREATS such as stable sepsis, metabolic/electrolyte imbalance with or without encephalopathy that is responding to early treatment.  Admit to Progressive based on following criteria: RESPIRATORY PROBLEMS hypoxemic/hypercapnic respiratory failure that is responsive to NIPPV (BiPAP) or High Flow Nasal Cannula (6-80 lpm). Frequent assessment/intervention, no > Q2 hrs < Q4 hrs, to maintain oxygenation and pulmonary hygiene.  May admit patient to [facility] or [facility] if equivalent level of care is available:: No  Covid Evaluation: Asymptomatic - no recent exposure (last 10 days) testing not required  Diagnosis: HCAP (healthcare-associated pneumonia) [[id number]]  Admitting Physician: Therisa Doyne [[id number]]  Attending Physician: Therisa Doyne [[id number]]  Certification:: I certify this patient will need inpatient services for at least 2 midnights  Expected Medical Readiness: 08/15/2023           inpatient     I Expect 2 midnight stay secondary to severity of patient's current illness need for inpatient interventions justified by the following:  hemodynamic instability despite optimal treatment (tachycardia  hypoxia )  Severe lab/radiological/exam abnormalities including:   HCAP and extensive comorbidities including:  DM2   CHF  CAD   COPD/asthma   That are currently affecting medical management.   I expect  patient to be hospitalized for 2 midnights requiring inpatient medical care.  Patient is at high risk for adverse outcome (such as loss of life or disability) if not treated.  Indication for inpatient stay as follows:  Severe change from baseline regarding mental status Hemodynamic instability despite maximal medical therapy,    New or worsening  hypoxia   Need for IV antibiotics, IV fluids, BIPAP    Level of care     progressive     stepdown   tele indefinitely please discontinue once patient no longer qualifies COVID-19 Labs    Lab Results  Component Value Date   SARSCOV2NAA NEGATIVE 08/12/2023  Precautions: admitted as   Covid Negative      Dreydon Cardenas 08/13/2023, 1:45 AM    [facility]     after 2 AM please page floor coverage PA If 7AM-7PM, please contact the day team taking care of the patient using [email or web address]

## 2023-08-12 NOTE — ED Triage Notes (Signed)
Pt BIB [facility] from pain clinic. Pt sent to ED due to acute on chronic weakness and confusion. Symptoms with gradual worsening x 2 weeks. Pt has L side paralysis at baseline due to hx. Pt with hx of CVA, CABG.    EMS Vitals   112/81 SpO2 93% on 4L O2  CBG 182

## 2023-08-12 NOTE — ED Provider Notes (Signed)
[place] EMERGENCY DEPARTMENT AT [facility] Provider Note  MDM   HPI/ROS:  Erika [name] is a 52 y.o. female with chronic hypoxic respiratory failure on 4 L at home, hypertension, hyperlipidemia, peripheral artery disease, prior CVA, CAD status post CABG presenting to the emergency department today for hypotension and altered mental status.  She was just recently discharged for acute hypoxic respiratory failure and discharged on 4 L.  Since discharge, she has been experiencing progressively worsening shortness of breath with increased productiveness of cough and now is requiring 6 L and quickly desats into the 70s when she is turned down.  Patient was at pain management appointment this morning when her providers noticed that her blood pressure was especially low and she was especially altered.  They recommended for her to report to the emergency department for evaluation.  Physical exam is notable for: - Chronically ill-appearing - Lung sounds diminished at bases - Left arm held in sling - No abdominal tenderness -No lower extremity edema  Vital signs upon arrival notable for hypotension with maps in the upper 60s to low 70s, febrile to 103.  Broad infectious metabolic workup initiated.  Workup remarkable for white count of 18.6 and chest x-ray demonstrating bilateral basilar infiltrates, worsened from prior admission.  Patient was initiated on full 30 cc fluid resuscitation and vancomycin and cefepime for HCAP.  Signed out to hospitalist for admission.  See inpatient provider notes for further details.  Disposition:  I discussed the case with hospitalist medicine who graciously agreed to admit the patient to their service for continued care.   Clinical Impression: No diagnosis found.  Rx / DC Orders ED Discharge Orders     None       The plan for this patient was discussed with Dr. Suezanne Jacquet, who voiced agreement and who oversaw evaluation and treatment of this  patient.   Clinical Complexity A medically appropriate history, review of systems, and physical exam was performed.  My independent interpretations of EKG, labs, and radiology are documented in the ED course above.   Click here for ABCD2, HEART and other calculatorsREFRESH Note before signing   Patient's presentation is most consistent with acute presentation with potential threat to life or bodily function.  Medical Decision Making Amount and/or Complexity of Data Reviewed Labs: ordered. Radiology: ordered.  Risk OTC drugs. Prescription drug management.    HPI/ROS      See MDM section for pertinent HPI and ROS. A complete ROS was performed with pertinent positives/negatives noted above.   Past Medical History:  Diagnosis Date   Acute respiratory failure with hypoxia (HCC) 06/24/2021   Arthralgia of temporomandibular joint    CAD, multiple vessel    a. 06/2016 Cath: ostLM 40%, ostLAD 40%, pLAD 95%, ost-pLCx 60%, pLCx 95%, mLCx 60%, mRCA 95%, D2 50%, LVSF nl;  b. 07/2016 CABG x 4 (LIMA->LAD, VG->Diag, VG->OM, VG->RCA); c. 08/2016 Cath: 3VD w/ 4/4 patent grafts. LAD distal to LIMA has diff dzs->Med rx; d. 08/2020 Cath: 4/4 patent grafts, native 3VD. EF 55-65%-->Med Rx.   Carotid arterial disease (HCC)    a. 07/2016 s/p R CEA; b. 02/2021 U/S: RICA 40-59%, LICA 1-39%.   Cerebrovascular disease    Clotting disorder (HCC)    Complex sleep apnea syndrome 10/03/2022   Depression    Diastolic dysfunction    a. 06/2016 Echo: EF 50-55%, mild inf wall HK, GR1DD, mild MR, RV sys fxn nl, mildly dilated LA, PASP nl; b. 06/2021 Echo: EF 60-65%, no rwma.  Nl RV fxn.   Essential hypertension    Essential hypertension, malignant 06/28/2016   Fatty liver disease, nonalcoholic 2016   History of blood transfusion    with heart surgery   History of MI (myocardial infarction) (July 2017) 08/02/2020   HLD (hyperlipidemia)    HTN (hypertension), malignant 10/20/2013   Labile hypertension    a. prior  renal ngiogram negative for RAS in 03/2016; b. catecholamines and metanephrines normal, mildly elevated renin with normal aldosterone and normal ratio in 02/2016   Mild tobacco abuse in early remission 06/28/2016   Myocardial infarction [facility]) 2017   Obesity    PAD (peripheral artery disease) (HCC)    a. 09/2018 s/p L SFA stenting; b. 07/2019 Periph Angio: Patent m/d L SFA stent w/ 100% L SFA distal to stent. L AT 100d, L Peroneal diff dzs-->Med Rx; c. 02/2021 ABIs: stable @ 0.61 on R and 0.46 on L.   PTSD (post-traumatic stress disorder)    Reflux gastritis 07/01/2023   Stroke (HCC)    Tobacco abuse    Type 2 diabetes mellitus (HCC) 12/2015    Past Surgical History:  Procedure Laterality Date   ABDOMINAL AORTOGRAM W/LOWER EXTREMITY [name] 10/15/2018   Procedure: ABDOMINAL AORTOGRAM W/LOWER EXTREMITY;  Surgeon: Iran Ouch, MD;  Location: [facility];  Service: Cardiovascular;  Laterality: [name];   ABDOMINAL AORTOGRAM W/LOWER EXTREMITY Bilateral 08/19/2019   Procedure: ABDOMINAL AORTOGRAM W/LOWER EXTREMITY;  Surgeon: Iran Ouch, MD;  Location: [facility];  Service: Cardiovascular;  Laterality: Bilateral;   CARDIAC CATHETERIZATION [name] 06/29/2016   Procedure: Left Heart Cath and Coronary Angiography;  Surgeon: Antonieta Iba, MD;  Location: [facility] INVASIVE CV LAB;  Service: Cardiovascular;  Laterality: [name];   CARDIAC CATHETERIZATION [name] 08/29/2016   Procedure: Left Heart Cath and Cors/Grafts Angiography;  Surgeon: Iran Ouch, MD;  Location: [facility];  Service: Cardiovascular;  Laterality: [name];   CESAREAN SECTION     CHOLECYSTECTOMY     CORONARY ARTERY BYPASS GRAFT [name] 07/06/2016   Procedure: CORONARY ARTERY BYPASS GRAFTING (CABG) x four, using left internal mammary artery and right leg greater saphenous vein harvested endoscopically;  Surgeon: Kerin Perna, MD;  Location: [facility] OR;  Service: Open Heart Surgery;  Laterality: [name];   ENDARTERECTOMY Right 07/06/2016    Procedure: ENDARTERECTOMY CAROTID;  Surgeon: Larina Earthly, MD;  Location: [facility] OR;  Service: Vascular;  Laterality: Right;   ENDARTERECTOMY Right 04/27/2020   Procedure: REDO OF RIGHT ENDARTERECTOMY CAROTID;  Surgeon: Larina Earthly, MD;  Location: [facility] OR;  Service: Vascular;  Laterality: Right;   LEFT HEART CATH AND CORS/GRAFTS ANGIOGRAPHY [name] 08/24/2020   Procedure: LEFT HEART CATH AND CORS/GRAFTS ANGIOGRAPHY;  Surgeon: Iran Ouch, MD;  Location: [facility];  Service: Cardiovascular;  Laterality: [name];   LEFT HEART CATH AND CORS/GRAFTS ANGIOGRAPHY [name] 01/29/2022   Procedure: LEFT HEART CATH AND CORS/GRAFTS ANGIOGRAPHY;  Surgeon: Iran Ouch, MD;  Location: [facility] INVASIVE CV LAB;  Service: Cardiovascular;  Laterality: [name];   PERIPHERAL VASCULAR CATHETERIZATION [name] 04/18/2016   Procedure: Renal Angiography;  Surgeon: Iran Ouch, MD;  Location: [facility];  Service: Cardiovascular;  Laterality: [name];   PERIPHERAL VASCULAR INTERVENTION Left 10/15/2018   Procedure: PERIPHERAL VASCULAR INTERVENTION;  Surgeon: Iran Ouch, MD;  Location: [facility];  Service: Cardiovascular;  Laterality: Left;  Left superficial femoral   TEE WITHOUT CARDIOVERSION [name] 07/06/2016   Procedure: TRANSESOPHAGEAL ECHOCARDIOGRAM (TEE);  Surgeon:  Kerin Perna, MD;  Location: [facility] OR;  Service: Open Heart Surgery;  Laterality: [name];   TONSILLECTOMY        Physical Exam   Vitals:   08/12/23 1717 08/12/23 2007 08/12/23 2051 08/12/23 2208  BP: 108/75 121/70 110/64 (!) 99/58  Pulse: (!) 118 (!) 105 99 88  Resp: 20  15 15   Temp: (!) 102.8 F (39.3 C) 99.6 F (37.6 C) 99.4 F (37.4 C) 98.4 F (36.9 C)  TempSrc: Oral Oral Oral Oral  SpO2: 94% 92% 96% 96%  Weight:      Height:        Physical Exam Vitals and nursing note reviewed.  Constitutional:      General: She is not in acute distress.    Appearance: She is well-developed. She is ill-appearing.  HENT:     Head: Normocephalic  and atraumatic.  Eyes:     Conjunctiva/sclera: Conjunctivae normal.  Cardiovascular:     Rate and Rhythm: Normal rate and regular rhythm.     Heart sounds: No murmur heard. Pulmonary:     Effort: Pulmonary effort is normal. No respiratory distress.     Breath sounds: Examination of the right-middle field reveals decreased breath sounds. Examination of the left-middle field reveals decreased breath sounds. Examination of the right-lower field reveals decreased breath sounds. Examination of the left-lower field reveals decreased breath sounds. Decreased breath sounds present.  Abdominal:     Palpations: Abdomen is soft.     Tenderness: There is no abdominal tenderness.  Musculoskeletal:        General: No swelling.     Cervical back: Neck supple.  Skin:    General: Skin is warm and dry.     Capillary Refill: Capillary refill takes less than 2 seconds.  Neurological:     Mental Status: She is alert.  Psychiatric:        Mood and Affect: Mood normal.    Starleen Arms, MD Department of Emergency Medicine   Please note that this documentation was produced with the assistance of voice-to-text technology and may contain errors.    Dyanne Iha, MD 08/12/23 2344    Lonell Grandchild, MD 08/13/23 [phone]

## 2023-08-12 NOTE — Progress Notes (Unsigned)
Subjective:    Patient ID: Erika [name], female    DOB: 1971/10/08, 52 y.o.   MRN: [id number]  HPI: Erika [name] is a 52 y.o. female who returns for follow up appointment for chronic pain and medication refill.she reports she has a headache  and left shoulder pain. Ms. Bicker , having difficulty with word finding, her husband reports she is experiencing increase confusion and was  diagnose with pneumoniae.   Husband reports last Wednesday she was transferring from chair to the toilet and fell on her left side, her husband helped her up. She has a Ortho appointment scheduled.  She was admitted to [facility] on 07/30/2023 and discharged on 08/02/2023 , she was admitted with Respiratory failure. Discharged Summary was reviewed.   She was admitted to [facility] on 07/16/2023 and discharged on 07/19/2023. Discharge Summary was reviewed.   She was admitted to [facility] on 07/03/2023 and discharged on 07/05/2023. Discharge Summary was reviewed.   She was admitted to [facility] on 06/12/2023 and discharged on 06/14/2023 with Sepsis. Discharged Summary was reviewed.   08/08/2023: CT Head WO Contrast IMPRESSION: 1. No CT evidence for acute intracranial abnormality. 2. Chronic right parietal and occipital infarcts. Chronic right pontine lacunar infarct.  DG: Left Humerus IMPRESSION: Acute comminuted and mildly displaced fracture involving the left humeral neck and proximal shaft.  She rates her pain 9. She is not following a exercise program at this time.   Ms. Winkfield Morphine equivalent is 95.00  MME.  She  is also prescribed Clonazepam  by Dr. Lolly Mustache  .We have discussed the black box warning of using opioids and benzodiazepines. I highlighted the dangers of using these drugs together and discussed the adverse events including respiratory suppression, overdose, cognitive impairment and importance of compliance with current regimen. We will  continue to monitor and adjust as indicated.  she is being closely monitored and under the care of her psychiatrist.      Pain Inventory Average Pain 9 Pain Right Now 9 My pain is constant, sharp, dull, and stabbing  In the last 24 hours, has pain interfered with the following? General activity 9 Relation with others 9 Enjoyment of life 8 What TIME of day is your pain at its worst? morning , daytime, evening, and night Sleep (in general) Poor  Pain is worse with: walking Pain improves with: pacing activities Relief from Meds: 2  Family History  Adopted: Yes  Problem Relation Age of Onset  . Diabetes Mother   . Diabetes Father   . Alcohol abuse Father   . Heart disease Father   . Drug abuse Father   . Stroke Sister   . Anxiety disorder Sister    Social History   Socioeconomic History  . Marital status: Married    Spouse name: Not on file  . Number of children: 1  . Years of education: 72  . Highest education level: Not on file  Occupational History  . Occupation: Disability  Tobacco Use  . Smoking status: Former    Current packs/day: 0.00    Average packs/day: 0.5 packs/day for 30.0 years (15.0 ttl pk-yrs)    Types: Cigarettes    Start date: 80    Quit date: 2022    Years since quitting: 2.6    Passive exposure: Never  . Smokeless tobacco: Never  Vaping Use  . Vaping status: Every Day  . Substances: Flavoring  Substance and Sexual Activity  . Alcohol use: Not Currently  Alcohol/week: 0.0 standard drinks of alcohol    Comment: socially  . Drug use: No  . Sexual activity: Yes    Partners: Male    Birth control/protection: None  Other Topics Concern  . Not on file  Social History Narrative   11/21/20   From: [name]   Living: with husband, Susy Frizzle (2009) and special needs daughter    Work: disability       Family: daughter - [name] (special needs, lives with her) and 2 grown step children - [name] and [name] Client       Enjoys: play with  dogs - 2 german Shepard, mut, and blue tick walker mix      Exercise: PAD limits exercise   Diet: diabetic diet and low potassium due to daughter      Safety   Seat belts: Yes    Guns: Yes  and secure   Safe in relationships: Yes    Social Determinants of Health   Financial Resource Strain: Not on file  Food Insecurity: No Food Insecurity (08/06/2023)   Hunger Vital Sign   . Worried About Programme researcher, broadcasting/film/video in the Last Year: Never true   . Ran Out of Food in the Last Year: Never true  Transportation Needs: No Transportation Needs (08/06/2023)   PRAPARE - Transportation   . Lack of Transportation (Medical): No   . Lack of Transportation (Non-Medical): No  Physical Activity: Not on file  Stress: Not on file  Social Connections: Not on file   Past Surgical History:  Procedure Laterality Date  . ABDOMINAL AORTOGRAM W/LOWER EXTREMITY [name] 10/15/2018   Procedure: ABDOMINAL AORTOGRAM W/LOWER EXTREMITY;  Surgeon: Iran Ouch, MD;  [facility];  Service: Cardiovascular;  Laterality: [name];  . ABDOMINAL AORTOGRAM W/LOWER EXTREMITY Bilateral 08/19/2019   Procedure: ABDOMINAL AORTOGRAM W/LOWER EXTREMITY;  Surgeon: Iran Ouch, MD;  Location: [facility];  Service: Cardiovascular;  Laterality: Bilateral;  . CARDIAC CATHETERIZATION [name] 06/29/2016   Procedure: Left Heart Cath and Coronary Angiography;  Surgeon: Antonieta Iba, MD;  Location: [facility];  Service: Cardiovascular;  Laterality: [name];  . CARDIAC CATHETERIZATION [name] 08/29/2016   Procedure: Left Heart Cath and Cors/Grafts Angiography;  Surgeon: Iran Ouch, MD;  [facility]: [facility];  Service: Cardiovascular;  Laterality: [name];  . CESAREAN SECTION    . CHOLECYSTECTOMY    . CORONARY ARTERY BYPASS GRAFT [name] 07/06/2016   Procedure: CORONARY ARTERY BYPASS GRAFTING (CABG) x four, using left internal mammary artery and right leg greater saphenous vein harvested endoscopically;  Surgeon:  Kerin Perna, MD;  Location: [facility] OR;  Service: Open Heart Surgery;  Laterality: [name];  . ENDARTERECTOMY Right 07/06/2016   Procedure: ENDARTERECTOMY CAROTID;  Surgeon: Larina Earthly, MD;  Location: [facility] OR;  Service: Vascular;  Laterality: Right;  . ENDARTERECTOMY Right 04/27/2020   Procedure: REDO OF RIGHT ENDARTERECTOMY CAROTID;  Surgeon: Larina Earthly, MD;  Location: [facility] OR;  Service: Vascular;  Laterality: Right;  . LEFT HEART CATH AND CORS/GRAFTS ANGIOGRAPHY [name] 08/24/2020   Procedure: LEFT HEART CATH AND CORS/GRAFTS ANGIOGRAPHY;  Surgeon: Iran Ouch, MD;  [facility]: [facility];  Service: Cardiovascular;  Laterality: [name];  . LEFT HEART CATH AND CORS/GRAFTS ANGIOGRAPHY [name] 01/29/2022   Procedure: LEFT HEART CATH AND CORS/GRAFTS ANGIOGRAPHY;  Surgeon: Iran Ouch, MD;  Location: [facility];  Service: Cardiovascular;  Laterality: [name];  . PERIPHERAL VASCULAR CATHETERIZATION [name] 04/18/2016   Procedure:  Renal Angiography;  Surgeon: Iran Ouch, MD;  [facility]: [facility];  Service: Cardiovascular;  Laterality: [name];  . PERIPHERAL VASCULAR INTERVENTION Left 10/15/2018   Procedure: PERIPHERAL VASCULAR INTERVENTION;  Surgeon: Iran Ouch, MD;  [facility]: [facility];  Service: Cardiovascular;  Laterality: Left;  Left superficial femoral  . TEE WITHOUT CARDIOVERSION [name] 07/06/2016   Procedure: TRANSESOPHAGEAL ECHOCARDIOGRAM (TEE);  Surgeon: Kerin Perna, MD;  Location: [facility] OR;  Service: Open Heart Surgery;  Laterality: [name];  . TONSILLECTOMY     Past Surgical History:  Procedure Laterality Date  . ABDOMINAL AORTOGRAM W/LOWER EXTREMITY [name] 10/15/2018   Procedure: ABDOMINAL AORTOGRAM W/LOWER EXTREMITY;  Surgeon: Iran Ouch, MD;  [facility];  Service: Cardiovascular;  Laterality: [name];  . ABDOMINAL AORTOGRAM W/LOWER EXTREMITY Bilateral 08/19/2019   Procedure: ABDOMINAL AORTOGRAM W/LOWER EXTREMITY;  Surgeon: Iran Ouch, MD;   [facility];  Service: Cardiovascular;  Laterality: Bilateral;  . CARDIAC CATHETERIZATION [name] 06/29/2016   Procedure: Left Heart Cath and Coronary Angiography;  Surgeon: Antonieta Iba, MD;  Location: [facility];  Service: Cardiovascular;  Laterality: [name];  . CARDIAC CATHETERIZATION [name] 08/29/2016   Procedure: Left Heart Cath and Cors/Grafts Angiography;  Surgeon: Iran Ouch, MD;  [facility];  Service: Cardiovascular;  Laterality: [name];  . CESAREAN SECTION    . CHOLECYSTECTOMY    . CORONARY ARTERY BYPASS GRAFT [name] 07/06/2016   Procedure: CORONARY ARTERY BYPASS GRAFTING (CABG) x four, using left internal mammary artery and right leg greater saphenous vein harvested endoscopically;  Surgeon: Kerin Perna, MD;  Location: [facility] OR;  Service: Open Heart Surgery;  Laterality: [name];  . ENDARTERECTOMY Right 07/06/2016   Procedure: ENDARTERECTOMY CAROTID;  Surgeon: Larina Earthly, MD;  Location: [facility] OR;  Service: Vascular;  Laterality: Right;  . ENDARTERECTOMY Right 04/27/2020   Procedure: REDO OF RIGHT ENDARTERECTOMY CAROTID;  Surgeon: Larina Earthly, MD;  Location: [facility] OR;  Service: Vascular;  Laterality: Right;  . LEFT HEART CATH AND CORS/GRAFTS ANGIOGRAPHY [name] 08/24/2020   Procedure: LEFT HEART CATH AND CORS/GRAFTS ANGIOGRAPHY;  Surgeon: Iran Ouch, MD;  [facility];  Service: Cardiovascular;  Laterality: [name];  . LEFT HEART CATH AND CORS/GRAFTS ANGIOGRAPHY [name] 01/29/2022   Procedure: LEFT HEART CATH AND CORS/GRAFTS ANGIOGRAPHY;  Surgeon: Iran Ouch, MD;  Location: [facility];  Service: Cardiovascular;  Laterality: [name];  . PERIPHERAL VASCULAR CATHETERIZATION [name] 04/18/2016   Procedure: Renal Angiography;  Surgeon: Iran Ouch, MD;  [facility];  Service: Cardiovascular;  Laterality: [name];  . PERIPHERAL VASCULAR INTERVENTION Left 10/15/2018   Procedure: PERIPHERAL VASCULAR INTERVENTION;  Surgeon: Iran Ouch, MD;  [facility];  Service: Cardiovascular;  Laterality: Left;  Left superficial femoral  . TEE WITHOUT CARDIOVERSION [name] 07/06/2016   Procedure: TRANSESOPHAGEAL ECHOCARDIOGRAM (TEE);  Surgeon: Kerin Perna, MD;  Location: [facility] OR;  Service: Open Heart Surgery;  Laterality: [name];  . TONSILLECTOMY     Past Medical History:  Diagnosis Date  . Acute respiratory failure with hypoxia (HCC) 06/24/2021  . Arthralgia of temporomandibular joint   . CAD, multiple vessel    a. 06/2016 Cath: ostLM 40%, ostLAD 40%, pLAD 95%, ost-pLCx 60%, pLCx 95%, mLCx 60%, mRCA 95%, D2 50%, LVSF nl;  b. 07/2016 CABG x 4 (LIMA->LAD, VG->Diag, VG->OM, VG->RCA); c. 08/2016 Cath: 3VD w/ 4/4 patent grafts. LAD distal to LIMA has  diff dzs->Med rx; d. 08/2020 Cath: 4/4 patent grafts, native 3VD. EF 55-65%-->Med Rx.  . Carotid arterial disease (HCC)    a. 07/2016 s/p R CEA; b. 02/2021 U/S: RICA 40-59%, LICA 1-39%.  . Cerebrovascular disease   . Clotting disorder (HCC)   . Complex sleep apnea syndrome 10/03/2022  . Depression   . Diastolic dysfunction    a. 06/2016 Echo: EF 50-55%, mild inf wall HK, GR1DD, mild MR, RV sys fxn nl, mildly dilated LA, PASP nl; b. 06/2021 Echo: EF 60-65%, no rwma. Nl RV fxn.  . Essential hypertension   . Essential hypertension, malignant 06/28/2016  . Fatty liver disease, nonalcoholic 2016  . History of blood transfusion    with heart surgery  . History of MI (myocardial infarction) (July 2017) 08/02/2020  . HLD (hyperlipidemia)   . HTN (hypertension), malignant 10/20/2013  . Labile hypertension    a. prior renal ngiogram negative for RAS in 03/2016; b. catecholamines and metanephrines normal, mildly elevated renin with normal aldosterone and normal ratio in 02/2016  . Mild tobacco abuse in early remission 06/28/2016  . Myocardial infarction (HCC) 2017  . Obesity   . PAD (peripheral artery disease) (HCC)    a. 09/2018 s/p L SFA stenting; b. 07/2019 Periph Angio: Patent m/d  L SFA stent w/ 100% L SFA distal to stent. L AT 100d, L Peroneal diff dzs-->Med Rx; c. 02/2021 ABIs: stable @ 0.61 on R and 0.46 on L.  Marland Kitchen PTSD (post-traumatic stress disorder)   . Reflux gastritis 07/01/2023  . Stroke (HCC)   . Tobacco abuse   . Type 2 diabetes mellitus (HCC) 12/2015   BP 120/79   Pulse (!) 114   Ht 5\' 7"  (1.702 m)   Wt 196 lb (88.9 kg)   SpO2 (!) 87% Comment: 6 l cont  BMI 30.70 kg/m   Opioid Risk Score:   Fall Risk Score:  `1  Depression screen PHQ 2/9     08/06/2023    2:09 PM 07/26/2023    3:51 PM 07/12/2023   11:39 AM 05/07/2023   11:56 AM 04/18/2023   10:01 AM 04/11/2023   10:38 AM 03/05/2023    1:21 PM  Depression screen PHQ 2/9  Decreased Interest 3 3 3 1 3 1 3   Down, Depressed, Hopeless 3 3 2 1 3 1 3   PHQ - 2 Score 6 6 5 2 6 2 6   Altered sleeping  3 3      Tired, decreased energy  3 3      Change in appetite  3 3      Feeling bad or failure about yourself   2 3      Trouble concentrating  2 3      Moving slowly or fidgety/restless  3 1      Suicidal thoughts  0 0      PHQ-9 Score  22 21      Difficult doing work/chores  Somewhat difficult Very difficult         Review of Systems  Musculoskeletal:        LT leg pain head pain  All other systems reviewed and are negative.      Objective:   Physical Exam Vitals and nursing note reviewed.  Constitutional:      Appearance: Normal appearance.     Comments: Alert x 2 to Name and Husband Has confusion   Cardiovascular:     Rate and Rhythm: Regular rhythm. Tachycardia present.  Pulses: Normal pulses.     Heart sounds: Normal heart sounds.  Pulmonary:     Effort: Pulmonary effort is normal.     Comments: Breath sounds Diminished : Left Side  Musculoskeletal:     Left lower leg: Edema present.     Comments: Normal Muscle Bulk and Muscle Testing Reveals:  Upper Extremities: Right:Upper Extremity: Full ROM and Muscle Strength  5/5 Left Upper Extremity: Decreased ROM wearing sling  Lower  Extremities: Right: Full ROM and Muscle Strength 5/5 Left Lower Extremity: Decreased ROM and Muscle Strength 5/5 Arrived in wheelchair     Skin:    General: Skin is warm and dry.  Neurological:     Mental Status: She is alert and oriented to person, place, and time.  Psychiatric:        Mood and Affect: Mood normal.        Behavior: Behavior normal.          Assessment & Plan:  Oxygen Desaturation: Husband went to get another Oxygen Take. O2 saturation was rechecked. She was transferred to [facility] via EMS. Confusion/ Tachycardia: Apical Pulse Checked: She was transferred to [facility] via EMS  Cerebrovascular Accident: Neurology Following. Continue current medication regimen. Continue to Monitor. 08/12/2023 Cluster Headache:Migraines Topamax  and Botox  ineffective  Continue. Aimovig 70 mg/ ml monthly.  Continue HEP as Tolerated. Continue to Monitor. 08/12/2023 Chronic Pain Refilled: Continue Oxycodone  continue to monitor.We will continue the opioid monitoring program, this consists of regular clinic visits, examinations, urine drug screen, pill counts as well as use of [place] Controlled Substance Reporting system. A 12 month History has been reviewed on the [place] Controlled Substance Reporting System on 08/12/2023   Neuropathy: Continue Lyrica. Continue to Monitor. 08/12/2023  Insomnia: Continue Amitriptyline. Continue to Monitor. 08/12/2023  Transferred to [facility] via EMS

## 2023-08-13 ENCOUNTER — Telehealth: Payer: Self-pay | Admitting: Cardiovascular Disease

## 2023-08-13 ENCOUNTER — Inpatient Hospital Stay (HOSPITAL_COMMUNITY): Payer: BC Managed Care – PPO

## 2023-08-13 ENCOUNTER — Other Ambulatory Visit: Payer: Self-pay

## 2023-08-13 ENCOUNTER — Encounter (HOSPITAL_COMMUNITY): Payer: Self-pay | Admitting: Internal Medicine

## 2023-08-13 ENCOUNTER — Ambulatory Visit: Payer: BC Managed Care – PPO

## 2023-08-13 DIAGNOSIS — N390 Urinary tract infection, site not specified: Secondary | ICD-10-CM | POA: Diagnosis present

## 2023-08-13 DIAGNOSIS — Z515 Encounter for palliative care: Secondary | ICD-10-CM | POA: Diagnosis not present

## 2023-08-13 DIAGNOSIS — J189 Pneumonia, unspecified organism: Secondary | ICD-10-CM | POA: Diagnosis not present

## 2023-08-13 DIAGNOSIS — Z7189 Other specified counseling: Secondary | ICD-10-CM | POA: Diagnosis not present

## 2023-08-13 LAB — COMPREHENSIVE METABOLIC PANEL
ALT: 9 U/L (ref 0–44)
AST: 12 U/L — ABNORMAL LOW (ref 15–41)
Albumin: 2.5 g/dL — ABNORMAL LOW (ref 3.5–5.0)
Alkaline Phosphatase: 41 U/L (ref 38–126)
Anion gap: 8 (ref 5–15)
BUN: 8 mg/dL (ref 6–20)
CO2: 27 mmol/L (ref 22–32)
Calcium: 9.4 mg/dL (ref 8.9–10.3)
Chloride: 102 mmol/L (ref 98–111)
Creatinine, Ser: 0.77 mg/dL (ref 0.44–1.00)
GFR, Estimated: >60 mL/min (ref >60)
Glucose, Bld: 122 mg/dL — ABNORMAL HIGH (ref 70–99)
Potassium: 3.5 mmol/L (ref 3.5–5.1)
Sodium: 137 mmol/L (ref 135–145)
Total Bilirubin: 0.4 mg/dL (ref 0.3–1.2)
Total Protein: 6.1 g/dL — ABNORMAL LOW (ref 6.5–8.1)

## 2023-08-13 LAB — CBC WITH DIFFERENTIAL/PLATELET
Abs Immature Granulocytes: 0.2 10*3/uL — ABNORMAL HIGH (ref 0.00–0.07)
Basophils Absolute: 0 10*3/uL (ref 0.0–0.1)
Basophils Relative: 0 %
Eosinophils Absolute: 0.2 10*3/uL (ref 0.0–0.5)
Eosinophils Relative: 1 %
HCT: 25.8 % — ABNORMAL LOW (ref 36.0–46.0)
Hemoglobin: 7.5 g/dL — ABNORMAL LOW (ref 12.0–15.0)
Immature Granulocytes: 2 %
Lymphocytes Relative: 15 %
Lymphs Abs: 2 10*3/uL (ref 0.7–4.0)
MCH: 22.1 pg — ABNORMAL LOW (ref 26.0–34.0)
MCHC: 29.1 g/dL — ABNORMAL LOW (ref 30.0–36.0)
MCV: 75.9 fL — ABNORMAL LOW (ref 80.0–100.0)
Monocytes Absolute: 0.7 10*3/uL (ref 0.1–1.0)
Monocytes Relative: 5 %
Neutro Abs: 10.4 10*3/uL — ABNORMAL HIGH (ref 1.7–7.7)
Neutrophils Relative %: 77 %
Platelets: 477 10*3/uL — ABNORMAL HIGH (ref 150–400)
RBC: 3.4 MIL/uL — ABNORMAL LOW (ref 3.87–5.11)
RDW: 23.2 % — ABNORMAL HIGH (ref 11.5–15.5)
WBC: 13.4 10*3/uL — ABNORMAL HIGH (ref 4.0–10.5)
nRBC: 0 % (ref 0.0–0.2)

## 2023-08-13 LAB — FERRITIN: Ferritin: 51 ng/mL (ref 11–307)

## 2023-08-13 LAB — I-STAT VENOUS BLOOD GAS, ED
Acid-Base Excess: 3 mmol/L — ABNORMAL HIGH (ref 0.0–2.0)
Bicarbonate: 28.2 mmol/L — ABNORMAL HIGH (ref 20.0–28.0)
Calcium, Ion: 1.3 mmol/L (ref 1.15–1.40)
HCT: 24 % — ABNORMAL LOW (ref 36.0–46.0)
Hemoglobin: 8.2 g/dL — ABNORMAL LOW (ref 12.0–15.0)
O2 Saturation: 99 %
Potassium: 3.6 mmol/L (ref 3.5–5.1)
Sodium: 139 mmol/L (ref 135–145)
TCO2: 30 mmol/L (ref 22–32)
pCO2, Ven: 46.5 mmHg (ref 44–60)
pH, Ven: 7.391 (ref 7.25–7.43)
pO2, Ven: 163 mmHg — ABNORMAL HIGH (ref 32–45)

## 2023-08-13 LAB — IRON AND TIBC
Iron: 8 ug/dL — ABNORMAL LOW (ref 28–170)
Saturation Ratios: 3 % — ABNORMAL LOW (ref 10.4–31.8)
TIBC: 283 ug/dL (ref 250–450)
UIBC: 275 ug/dL

## 2023-08-13 LAB — I-STAT CG4 LACTIC ACID, ED
Lactic Acid, Venous: 0.5 mmol/L (ref 0.5–1.9)
Lactic Acid, Venous: 0.7 mmol/L (ref 0.5–1.9)

## 2023-08-13 LAB — PHOSPHORUS
Phosphorus: 3.2 mg/dL (ref 2.5–4.6)
Phosphorus: 3.3 mg/dL (ref 2.5–4.6)

## 2023-08-13 LAB — FOLATE: Folate: 16.2 ng/mL (ref >5.9)

## 2023-08-13 LAB — CBG MONITORING, ED
Glucose-Capillary: 129 mg/dL — ABNORMAL HIGH (ref 70–99)
Glucose-Capillary: 119 mg/dL — ABNORMAL HIGH (ref 70–99)
Glucose-Capillary: 122 mg/dL — ABNORMAL HIGH (ref 70–99)
Glucose-Capillary: 116 mg/dL — ABNORMAL HIGH (ref 70–99)

## 2023-08-13 LAB — GLUCOSE, CAPILLARY
Glucose-Capillary: 100 mg/dL — ABNORMAL HIGH (ref 70–99)
Glucose-Capillary: 93 mg/dL (ref 70–99)

## 2023-08-13 LAB — AMMONIA: Ammonia: 22 umol/L (ref 9–35)

## 2023-08-13 LAB — CK: Total CK: 143 U/L (ref 38–234)

## 2023-08-13 LAB — PREALBUMIN: Prealbumin: 6 mg/dL — ABNORMAL LOW (ref 18–38)

## 2023-08-13 LAB — MAGNESIUM
Magnesium: 1.8 mg/dL (ref 1.7–2.4)
Magnesium: 1.9 mg/dL (ref 1.7–2.4)

## 2023-08-13 LAB — STREP PNEUMONIAE URINARY ANTIGEN: Strep Pneumo Urinary Antigen: NEGATIVE

## 2023-08-13 LAB — CBC
HCT: 25.5 % — ABNORMAL LOW (ref 36.0–46.0)
Hemoglobin: 7.4 g/dL — ABNORMAL LOW (ref 12.0–15.0)
MCH: 22.2 pg — ABNORMAL LOW (ref 26.0–34.0)
MCHC: 29 g/dL — ABNORMAL LOW (ref 30.0–36.0)
MCV: 76.3 fL — ABNORMAL LOW (ref 80.0–100.0)
Platelets: 456 10*3/uL — ABNORMAL HIGH (ref 150–400)
RBC: 3.34 MIL/uL — ABNORMAL LOW (ref 3.87–5.11)
RDW: 23.6 % — ABNORMAL HIGH (ref 11.5–15.5)
WBC: 12.4 10*3/uL — ABNORMAL HIGH (ref 4.0–10.5)
nRBC: 0 % (ref 0.0–0.2)

## 2023-08-13 LAB — VITAMIN B12: Vitamin B-12: 705 pg/mL (ref 180–914)

## 2023-08-13 LAB — RETICULOCYTES
Immature Retic Fract: 23.6 % — ABNORMAL HIGH (ref 2.3–15.9)
RBC.: 3.3 MIL/uL — ABNORMAL LOW (ref 3.87–5.11)
Retic Count, Absolute: 61.4 10*3/uL (ref 19.0–186.0)
Retic Ct Pct: 1.9 % (ref 0.4–3.1)

## 2023-08-13 LAB — SARS CORONAVIRUS 2 BY RT PCR: SARS Coronavirus 2 by RT PCR: NEGATIVE

## 2023-08-13 LAB — HEMOGLOBIN A1C
Hgb A1c MFr Bld: 5.8 % — ABNORMAL HIGH (ref 4.8–5.6)
Mean Plasma Glucose: 119.76 mg/dL

## 2023-08-13 LAB — PROCALCITONIN: Procalcitonin: 2.6 ng/mL

## 2023-08-13 LAB — MRSA NEXT GEN BY PCR, NASAL: MRSA by PCR Next Gen: NOT DETECTED

## 2023-08-13 LAB — APTT: aPTT: 44 seconds — ABNORMAL HIGH (ref 24–36)

## 2023-08-13 LAB — TSH: TSH: 5.49 u[IU]/mL — ABNORMAL HIGH (ref 0.350–4.500)

## 2023-08-13 MED ORDER — SODIUM CHLORIDE 0.9 % IV SOLN: INTRAVENOUS | Status: AC

## 2023-08-13 MED ORDER — ACETAMINOPHEN 650 MG RE SUPP: 650.0000 mg | Freq: Four times a day (QID) | RECTAL | Status: DC | PRN

## 2023-08-13 MED ORDER — LAMOTRIGINE 25 MG PO TABS: 25.0000 mg | ORAL_TABLET | Freq: Every day | ORAL | Status: DC

## 2023-08-13 MED ORDER — CLOPIDOGREL BISULFATE 75 MG PO TABS
75.0000 mg | ORAL_TABLET | Freq: Every day | ORAL | Status: DC
  Administered 2023-08-13 – 2023-08-14 (×2): 75 mg via ORAL
  Filled 2023-08-13 (×2): qty 1

## 2023-08-13 MED ORDER — TOPIRAMATE 25 MG PO TABS
25.0000 mg | ORAL_TABLET | Freq: Every day | ORAL | Status: DC
  Administered 2023-08-13: 25 mg via ORAL
  Filled 2023-08-13: qty 1

## 2023-08-13 MED ORDER — ONDANSETRON HCL 4 MG/2ML IJ SOLN: 4.0000 mg | Freq: Four times a day (QID) | INTRAMUSCULAR | Status: DC | PRN

## 2023-08-13 MED ORDER — PREGABALIN 75 MG PO CAPS
225.0000 mg | ORAL_CAPSULE | Freq: Two times a day (BID) | ORAL | Status: DC
  Administered 2023-08-13 – 2023-08-14 (×4): 225 mg via ORAL
  Filled 2023-08-13 (×2): qty 3
  Filled 2023-08-13 (×2): qty 1

## 2023-08-13 MED ORDER — INSULIN ASPART 100 UNIT/ML IJ SOLN
0.0000 [IU] | INTRAMUSCULAR | Status: DC
  Administered 2023-08-13 (×2): 1 [IU] via SUBCUTANEOUS

## 2023-08-13 MED ORDER — FLUDROCORTISONE ACETATE 0.1 MG PO TABS
0.1000 mg | ORAL_TABLET | Freq: Every day | ORAL | Status: DC
  Administered 2023-08-13 – 2023-08-14 (×2): 0.1 mg via ORAL
  Filled 2023-08-13 (×3): qty 1

## 2023-08-13 MED ORDER — GUAIFENESIN ER 600 MG PO TB12
600.0000 mg | ORAL_TABLET | Freq: Two times a day (BID) | ORAL | Status: DC
  Administered 2023-08-13 – 2023-08-14 (×4): 600 mg via ORAL
  Filled 2023-08-13 (×4): qty 1

## 2023-08-13 MED ORDER — SODIUM CHLORIDE 0.9 % IV SOLN
2.0000 g | Freq: Three times a day (TID) | INTRAVENOUS | Status: DC
  Administered 2023-08-13 – 2023-08-14 (×4): 2 g via INTRAVENOUS
  Filled 2023-08-13 (×4): qty 12.5

## 2023-08-13 MED ORDER — ONDANSETRON HCL 4 MG PO TABS: 4.0000 mg | ORAL_TABLET | Freq: Four times a day (QID) | ORAL | Status: DC | PRN

## 2023-08-13 MED ORDER — ALBUTEROL SULFATE (2.5 MG/3ML) 0.083% IN NEBU: 2.5000 mg | INHALATION_SOLUTION | RESPIRATORY_TRACT | Status: DC | PRN

## 2023-08-13 MED ORDER — OXYCODONE HCL 5 MG PO TABS
5.0000 mg | ORAL_TABLET | ORAL | Status: DC | PRN
  Administered 2023-08-13: 5 mg via ORAL
  Filled 2023-08-13: qty 1

## 2023-08-13 MED ORDER — ASPIRIN 81 MG PO CHEW
81.0000 mg | CHEWABLE_TABLET | Freq: Every day | ORAL | Status: DC
  Administered 2023-08-13 – 2023-08-14 (×2): 81 mg via ORAL
  Filled 2023-08-13 (×2): qty 1

## 2023-08-13 MED ORDER — PANTOPRAZOLE SODIUM 40 MG PO TBEC
40.0000 mg | DELAYED_RELEASE_TABLET | Freq: Every day | ORAL | Status: DC
  Administered 2023-08-13 – 2023-08-14 (×2): 40 mg via ORAL
  Filled 2023-08-13 (×2): qty 1

## 2023-08-13 MED ORDER — CLONAZEPAM 0.5 MG PO TABS
0.5000 mg | ORAL_TABLET | Freq: Two times a day (BID) | ORAL | Status: DC
  Administered 2023-08-13 – 2023-08-14 (×4): 0.5 mg via ORAL
  Filled 2023-08-13 (×4): qty 1

## 2023-08-13 MED ORDER — NAPROXEN 250 MG PO TABS
500.0000 mg | ORAL_TABLET | Freq: Two times a day (BID) | ORAL | Status: DC
  Administered 2023-08-13 – 2023-08-14 (×2): 500 mg via ORAL
  Filled 2023-08-13 (×3): qty 2

## 2023-08-13 MED ORDER — LAMOTRIGINE 25 MG PO TABS
225.0000 mg | ORAL_TABLET | Freq: Every day | ORAL | Status: DC
  Administered 2023-08-13: 225 mg via ORAL
  Filled 2023-08-13: qty 1

## 2023-08-13 MED ORDER — OXYCODONE HCL 5 MG PO TABS
5.0000 mg | ORAL_TABLET | ORAL | Status: DC | PRN
  Administered 2023-08-13 – 2023-08-14 (×4): 10 mg via ORAL
  Filled 2023-08-13 (×4): qty 2

## 2023-08-13 MED ORDER — LIDOCAINE 5 % EX PTCH
1.0000 | MEDICATED_PATCH | CUTANEOUS | Status: DC
  Administered 2023-08-13: 1 via TRANSDERMAL
  Filled 2023-08-13: qty 1

## 2023-08-13 MED ORDER — SODIUM CHLORIDE 0.9 % IV SOLN: INTRAVENOUS | Status: DC | PRN

## 2023-08-13 MED ORDER — ESCITALOPRAM OXALATE 10 MG PO TABS
20.0000 mg | ORAL_TABLET | Freq: Every day | ORAL | Status: DC
  Administered 2023-08-13 – 2023-08-14 (×2): 20 mg via ORAL
  Filled 2023-08-13 (×2): qty 2

## 2023-08-13 MED ORDER — VANCOMYCIN HCL 750 MG/150ML IV SOLN
750.0000 mg | Freq: Two times a day (BID) | INTRAVENOUS | Status: DC
  Administered 2023-08-13 – 2023-08-14 (×2): 750 mg via INTRAVENOUS
  Filled 2023-08-13 (×3): qty 150

## 2023-08-13 MED ORDER — LAMOTRIGINE 25 MG PO TABS: 200.0000 mg | ORAL_TABLET | Freq: Every day | ORAL | Status: DC

## 2023-08-13 MED ORDER — AMITRIPTYLINE HCL 10 MG PO TABS
10.0000 mg | ORAL_TABLET | Freq: Every day | ORAL | Status: DC
  Administered 2023-08-13: 10 mg via ORAL
  Filled 2023-08-13: qty 1

## 2023-08-13 MED ORDER — FENTANYL CITRATE PF 50 MCG/ML IJ SOSY
12.5000 ug | PREFILLED_SYRINGE | INTRAMUSCULAR | Status: DC | PRN
  Administered 2023-08-13 – 2023-08-14 (×9): 50 ug via INTRAVENOUS
  Filled 2023-08-13 (×10): qty 1

## 2023-08-13 MED ORDER — ACETAMINOPHEN 325 MG PO TABS
650.0000 mg | ORAL_TABLET | Freq: Four times a day (QID) | ORAL | Status: DC | PRN
  Administered 2023-08-13 (×2): 650 mg via ORAL
  Filled 2023-08-13 (×2): qty 2

## 2023-08-13 NOTE — Progress Notes (Signed)
PROGRESS NOTE    Erika [name]  ZHY:[id number] DOB: June 25, 1971 DOA: 08/12/2023 PCP: Eden Emms, NP   Brief Narrative:  Erika [name] is a 52 y.o. female with medical history significant of left hemiplegia and hemiparesis affecting the diaphragm multiple cerebral infarcts chronic respiratory failure at baseline on 4 L, hypertension hyperlipidemia, history of CABG and and enterectomy, obesity sleep apnea, PAD PNA PTSD diabetes type 2, presented with confusion, fatigue and shortness of breath and was diagnosed with acute on chronic hypoxic respiratory failure secondary to hospital-acquired pneumonia as patient was recently admitted at [facility] from 07/30/2023 through 08/02/2023 for the similar issue as well as septic shock.  Patient was noted to be requiring 6 L of oxygen upon admission.  Assessment & Plan:   Principal Problem:   HCAP (healthcare-associated pneumonia) Active Problems:   Acute on chronic respiratory failure with hypoxia (HCC)   Anemia   Chronic diastolic CHF (congestive heart failure) (HCC)   CAD (coronary artery disease)   Hemiplegia and hemiparesis following cerebral infarction affecting left non-dominant side (HCC)   Type 2 diabetes mellitus with other specified complication (HCC)   Acute metabolic encephalopathy/delirium/polypharmacy   OSA (obstructive sleep apnea)   UTI (urinary tract infection)   Palliative care by specialist   Goals of care, counseling/discussion  Acute on chronic hypoxic respiratory failure as well as sepsis secondary to healthcare associated pneumonia, POA: Patient met sepsis criteria based on fever of 102.8 and tachycardia and obvious source of persistent pneumonia.  Was on 6 L of oxygen earlier but currently on 4 L and she is fully alert and oriented and comfortable and denies any shortness of breath.  Does have rhonchi bilaterally but sepsis parameters have resolved now.  Continue cefepime and vancomycin and follow cultures.  History of  multiple strokes with left hemiplegia: Chronic and stable.  Aspirin and Plavix.  Acute comminuted and mildly displaced fracture of the left humeral neck: This happened as outpatient on 08/08/2023 after a fall.  She saw her PCP who sent her for the x-rays which showed fracture.  Patient lives in [place] and orthopedic group over there is not in her insurance network.  Patient has been is requesting consulting orthopedics here.  I have sent a secure chat message to Earney Hamburg, PA with orthopedics.  Type 2 diabetes mellitus: Continue SSI.  Hemoglobin A1c only 5.8.  Chronic diastolic congestive heart failure: Appears stable.  She is not on any diuretics.  Acute metabolic encephalopathy/delirium/polypharmacy: Source unknown.  CT head negative.  Patient fully alert and oriented at the moment.  OSA: Continue BiPAP as needed.  History of CAD: Resume aspirin and Plavix.  Anemia of chronic disease: Patient appears to have baseline hemoglobin around 9.  Currently 7.4.  No indication of transfusion.  Repeat in the morning and transfuse if less than 7.  Avoid Lovenox for now.  GOC: Patient was recently seen by palliative care at [facility].  She was seen by them here again and she is now interested in having discussion with the hospice.  Hospice liaison is made aware.  They have meeting with them today.  DVT prophylaxis: SCDs Start: 08/13/23 0307   Code Status: DNR  Family Communication: Husband present at bedside.  Plan of care discussed with patient in length and he/she verbalized understanding and agreed with it.  Status is: Inpatient Remains inpatient appropriate because: Multiple medical issues as mentioned above.   Estimated body mass index is 30.7 kg/m as calculated from the following:  Height as of this encounter: 5\' 7"  (1.702 m).   Weight as of this encounter: 88.9 kg.  Pressure Injury 01/26/22 Sacrum Right Stage 2 -  Partial thickness loss of dermis presenting as a shallow open injury  with a red, pink wound bed without slough. (Active)  01/26/22 2130  Location: Sacrum  Location Orientation: Right  Staging: Stage 2 -  Partial thickness loss of dermis presenting as a shallow open injury with a red, pink wound bed without slough. (abrasion)  Wound Description (Comments):   Present on Admission: Yes  Dressing Type Foam - Lift dressing to assess site every shift 08/01/23 1943   Nutritional Assessment: Body mass index is 30.7 kg/m.Marland Kitchen Seen by dietician.  I agree with the assessment and plan as outlined below: Nutrition Status:        . Skin Assessment: I have examined the patient's skin and I agree with the wound assessment as performed by the wound care RN as outlined below: Pressure Injury 01/26/22 Sacrum Right Stage 2 -  Partial thickness loss of dermis presenting as a shallow open injury with a red, pink wound bed without slough. (Active)  01/26/22 2130  Location: Sacrum  Location Orientation: Right  Staging: Stage 2 -  Partial thickness loss of dermis presenting as a shallow open injury with a red, pink wound bed without slough. (abrasion)  Wound Description (Comments):   Present on Admission: Yes  Dressing Type Foam - Lift dressing to assess site every shift 08/01/23 1943    Consultants:  Orthopedics and palliative care  Procedures:  As above  Antimicrobials:  Anti-infectives (From admission, onward)    Start     Dose/Rate Route Frequency Ordered Stop   08/13/23 1200  vancomycin (VANCOREADY) IVPB 750 mg/150 mL        750 mg 150 mL/hr over 60 Minutes Intravenous Every 12 hours 08/13/23 0058     08/13/23 0600  ceFEPIme (MAXIPIME) 2 g in sodium chloride 0.9 % 100 mL IVPB        2 g 200 mL/hr over 30 Minutes Intravenous Every 8 hours 08/13/23 0058     08/12/23 2115  vancomycin (VANCOREADY) IVPB 1750 mg/350 mL        1,750 mg 175 mL/hr over 120 Minutes Intravenous  Once 08/12/23 2106 08/13/23 0023   08/12/23 2115  ceFEPIme (MAXIPIME) 2 g in sodium  chloride 0.9 % 100 mL IVPB        2 g 200 mL/hr over 30 Minutes Intravenous  Once 08/12/23 2106 08/12/23 2202         Subjective: Patient seen and examined.  She is fully alert and oriented.  She denies any shortness of breath.  Appears in pain and only complaint she has is pain in the left arm.  Husband at the bedside.  Objective: Vitals:   08/13/23 0630 08/13/23 0645 08/13/23 0700 08/13/23 0836  BP: (!) 119/56 117/63 94/66   Pulse: 77 79 86   Resp: 12 13 18    Temp:    98.1 F (36.7 C)  TempSrc:    Oral  SpO2: 96% 98% 100%   Weight:      Height:        Intake/Output Summary (Last 24 hours) at 08/13/2023 0951 Last data filed at 08/13/2023 0706 Gross per 24 hour  Intake 2767 ml  Output --  Net 2767 ml   Filed Weights   08/12/23 1714  Weight: 88.9 kg    Examination:  General exam: Appears calm and  comfortable  Respiratory system: Rhonchi bilaterally. Respiratory effort normal. Cardiovascular system: S1 & S2 heard, RRR. No JVD, murmurs, rubs, gallops or clicks. No pedal edema. Gastrointestinal system: Abdomen is nondistended, soft and nontender. No organomegaly or masses felt. Normal bowel sounds heard. Central nervous system: Alert and oriented.  Left hemiplegia, known and chronic. Extremities: Has swelling in the left arm. Skin: No rashes, lesions or ulcers Psychiatry: Judgement and insight appear normal. Mood & affect appropriate.    Data Reviewed: I have personally reviewed following labs and imaging studies  CBC: Recent Labs  Lab 08/08/23 1520 08/12/23 1759 08/13/23 0235 08/13/23 0313 08/13/23 0407  WBC 7.5 18.6* 13.4*  --  12.4*  NEUTROABS  --   --  10.4*  --   --   HGB 9.2* 9.4* 7.5* 8.2* 7.4*  HCT 30.6* 31.6* 25.8* 24.0* 25.5*  MCV 74.0* 77.1* 75.9*  --  76.3*  PLT 428.0* 509* 477*  --  456*   Basic Metabolic Panel: Recent Labs  Lab 08/08/23 1520 08/12/23 1759 08/13/23 0235 08/13/23 0313 08/13/23 0407  NA 136 133*  --  139 137  K 4.1 4.2   --  3.6 3.5  CL 96 98  --   --  102  CO2 35* 22  --   --  27  GLUCOSE 204* 163*  --   --  122*  BUN 11 10  --   --  8  CREATININE 0.74 0.85  --   --  0.77  CALCIUM 9.9 9.9  --   --  9.4  MG  --   --  1.8  --  1.9  PHOS  --   --  3.2  --  3.3   GFR: Estimated Creatinine Clearance: 97.4 mL/min (by C-G formula based on SCr of 0.77 mg/dL). Liver Function Tests: Recent Labs  Lab 08/08/23 1520 08/12/23 1759 08/13/23 0407  AST 11 14* 12*  ALT 11 12 9   ALKPHOS 58 50 41  BILITOT 0.3 0.6 0.4  PROT 6.8 7.1 6.1*  ALBUMIN 3.7 3.1* 2.5*   No results for input(s): "LIPASE", "AMYLASE" in the last 168 hours. Recent Labs  Lab 08/13/23 0235  AMMONIA 22   Coagulation Profile: Recent Labs  Lab 08/12/23 1759  INR 1.1   Cardiac Enzymes: Recent Labs  Lab 08/13/23 0235  CKTOTAL 143   BNP (last 3 results) No results for input(s): "PROBNP" in the last 8760 hours. HbA1C: Recent Labs    08/13/23 0235  HGBA1C 5.8*   CBG: Recent Labs  Lab 08/13/23 0229 08/13/23 0750 08/13/23 0821  GLUCAP 129* 119* 122*   Lipid Profile: No results for input(s): "CHOL", "HDL", "LDLCALC", "TRIG", "CHOLHDL", "LDLDIRECT" in the last 72 hours. Thyroid Function Tests: Recent Labs    08/13/23 0235  TSH 5.490*   Anemia Panel: Recent Labs    08/13/23 0235  VITAMINB12 705  FOLATE 16.2  FERRITIN 51  TIBC 283  IRON 8*  RETICCTPCT 1.9   Sepsis Labs: Recent Labs  Lab 08/12/23 1812 08/13/23 0235 08/13/23 0313 08/13/23 0507  PROCALCITON  --  2.60  --   --   LATICACIDVEN 1.2  --  0.5 0.7    Recent Results (from the past 240 hour(s))  Culture, blood (Routine x 2)     Status: None (Preliminary result)   Collection Time: 08/12/23  5:48 PM   Specimen: BLOOD RIGHT FOREARM  Result Value Ref Range Status   Specimen Description BLOOD RIGHT FOREARM  Final   Special  Requests   Final    BOTTLES DRAWN AEROBIC AND ANAEROBIC Blood Culture adequate volume   Culture   Final    NO GROWTH < 12  HOURS Performed at [facility], [address] [place], [place] [address]    Report Status PENDING  Incomplete  Culture, blood (Routine x 2)     Status: None (Preliminary result)   Collection Time: 08/12/23  5:59 PM   Specimen: BLOOD RIGHT ARM  Result Value Ref Range Status   Specimen Description BLOOD RIGHT ARM  Final   Special Requests   Final    BOTTLES DRAWN AEROBIC AND ANAEROBIC Blood Culture results may not be optimal due to an inadequate volume of blood received in culture bottles   Culture   Final    NO GROWTH < 12 HOURS Performed at [facility], [address] [place], [place] [address]    Report Status PENDING  Incomplete  SARS Coronavirus 2 by RT PCR (hospital order, performed in [facility] hospital lab) *cepheid single result test* Anterior Nasal Swab     Status: None   Collection Time: 08/12/23 11:52 PM   Specimen: Anterior Nasal Swab  Result Value Ref Range Status   SARS Coronavirus 2 by RT PCR NEGATIVE NEGATIVE Final    Comment: Performed at [facility], [address], [place], [place] [address]     Radiology Studies: CT HEAD WO CONTRAST ( )  Result Date: 08/13/2023 CLINICAL DATA:  Acute on chronic weakness and confusion, stroke suspected EXAM: CT HEAD WITHOUT CONTRAST TECHNIQUE: Contiguous axial images were obtained from the base of the skull through the vertex without intravenous contrast. RADIATION DOSE REDUCTION: This exam was performed according to the departmental dose-optimization program which includes automated exposure control, adjustment of the mA and/or kV according to patient size and/or use of iterative reconstruction technique. COMPARISON:  08/08/2023 FINDINGS: Brain: No evidence of acute infarction, hemorrhage, mass, mass effect, or midline shift. No hydrocephalus or extra-axial fluid collection. Redemonstrated right parietal and occipital encephalomalacia, consistent with remote infarcts. Redemonstrated chronic lacunar infarct in  right pons. Vascular: No hyperdense vessel. Atherosclerotic calcifications in the intracranial carotid and vertebral arteries. Skull: Negative for fracture or focal lesion. Sinuses/Orbits: Minimal mucosal thickening in the ethmoid air cells. No acute finding in the orbits. Other: The mastoid air cells are well aerated. IMPRESSION: No acute intracranial process. Electronically Signed   By: Wiliam Ke M.D.   On: 08/13/2023 01:36   DG Chest 2 View  Result Date: 08/12/2023 CLINICAL DATA:  Altered mental status.  Suspected sepsis. EXAM: CHEST - 2 VIEW COMPARISON:  07/30/2023 year.  CT 08/08/2023 FINDINGS: Prior CABG. Cardiomegaly. Low lung volumes. Bilateral lower lobe airspace opacities are noted. No visible effusions. No acute bony abnormality. IMPRESSION: Very low lung volumes with bibasilar atelectasis or infiltrates. Cardiomegaly. Electronically Signed   By: Charlett Nose M.D.   On: 08/12/2023 20:12    Scheduled Meds:  aspirin  81 mg Oral Daily   clonazePAM  0.5 mg Oral BID   clopidogrel  75 mg Oral Daily   escitalopram  20 mg Oral Daily   fludrocortisone  0.1 mg Oral Daily   guaiFENesin  600 mg Oral BID   insulin aspart  0-9 Units Subcutaneous Q4H   lamoTRIgine  225 mg Oral QHS   pantoprazole  40 mg Oral Daily   pregabalin  225 mg Oral BID   topiramate  25 mg Oral QHS   Continuous Infusions:  sodium  chloride 75 mL/hr at 08/13/23 0501   ceFEPime (MAXIPIME) IV Stopped (08/13/23 0706)   vancomycin       LOS: 1 day   Hughie Closs, MD [facility]  08/13/2023, 9:51 AM   *Please note that this is a verbal dictation therefore any spelling or grammatical errors are due to the "Dragon Medical One" system interpretation.  Please page via Amion and do not message via secure chat for urgent patient care matters. Secure chat can be used for non urgent patient care matters.  How to contact the [facility] Attending or Consulting provider 7A - 7P or covering provider during after hours 7P -7A,  for this patient?  Check the care team in [facility] and look for a) attending/consulting [facility] provider listed and b) the [facility] team listed. Page or secure chat 7A-7P. Log into [email or web address] and use [place]'s universal password to access. If you do not have the password, please contact the hospital operator. Locate the [facility] provider you are looking for under [facility] and page to a number that you can be directly reached. If you still have difficulty reaching the provider, please page the [facility] (Director on Call) for the Hospitalists listed on amion for assistance.

## 2023-08-13 NOTE — Final Consult Note (Signed)
Palliative Medicine Inpatient Consult Note  Consulting Provider:  Therisa Doyne, MD   Reason for consult:   Palliative Care Consult Services Palliative Medicine Consult  Reason for Consult? goals pf care   08/13/2023  HPI:  Per intake H&P -->   Erika [name] is a 52 y.o. female with medical history significant of hemiplegia and hemiparesis affecting the diaphragm multiple cerebral infarcts chronic respiratory failure at baseline on 4 L, hypertension hyperlipidemia, history of CABG and and enterectomy status post to monitor thousand 17, stroke with left-sided deficit, obesity sleep apnea, PAD PNA PTSD diabetes type 2 patient is on BiPAP at night chronic aspiration recurrent sepsis   Palliative care asked to get involved in the setting of chronic disease burden.   Clinical Assessment/Goals of Care:  *Please note that this is a verbal dictation therefore any spelling or grammatical errors are due to the "Dragon Medical One" system interpretation.  I have reviewed medical records including EPIC notes, labs and imaging, received report from bedside RN, assessed the patient who is lying on a gurney in distress as related to the pain of her arm.    I met with Erika [name] at bedside to further discuss diagnosis prognosis, GOC, EOL wishes, disposition and options.   I introduced Palliative Medicine as specialized medical care for people living with serious illness. It focuses on providing relief from the symptoms and stress of a serious illness. The goal is to improve quality of life for both the patient and the family.  Medical History Review and Understanding:  I reviewed with Erika [name] her PMH of CAD - s/p CABG, HTN, hemiplegia, chronic respiratory failure on 4 L of oxygen at home, and prior strokes.  Social History:  Erika [name] shares that she is from [facility]  She has been married twice and has been with her spouse, Erika [name] for the past 15 years.  She has 1  child-Erika [name] from her prior marriage who is 50 years old and has special needs.  She is a stepmother to her husband's 2 children from his previous marriage.  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  She is a woman of faith and practices within Christianity.  Jayci gets joy out of spending time with her family and "just being with them".  Erika [name] shares that she has been a very strong spirit throughout the duration of her life and has always had a "hold my beer" mentality to her.  Functional and Nutritional State:  Preceding hospitalization Erika [name] has been dependent upon her husband to help with B ADLs and IADLs.  She does have a good appetite.  Advance Directives:  A detailed discussion was had today regarding advanced directives.  Advance directives are in [place].   Code Status:  Concepts specific to code status, artifical feeding and hydration, continued IV antibiotics and rehospitalization was had.  The difference between a aggressive medical intervention path  and a palliative comfort care path for this patient at this time was had.   Confirmed with Lacrisha that she would not want intubation, shocks, or CPR.  Reviewed and asserted the desire for a DO NOT RESUSCITATE DO NOT INTUBATE CODE STATUS.  Discussion:  Chavone and I discussed her recurrent rehospitalization's over the past 6 months.  We reviewed her options moving forward and the worry that she will continue to have precipitous events and further decline as a result of each acute hospital stay.  Reviewed chronic disease trajectory as a relates to [name]'s clinical  condition.  We discussed the differences between palliative and hospice care:  Palliative care is specialized medical care for people living with a serious illness, such as cancer, heart failure, COPD, Alzheimer's dementia, etc. Patients in palliative care may receive medical care for their symptoms, or palliative care, along with treatment  intended to cure their serious illness   Hospice care focuses on the care, comfort, and quality of life of a person with a serious illness who is approaching the end of life. At some point, it may not be possible to cure a serious illness, or a patient may choose not to undergo certain treatments. Hospice is designed for this situation.  [name] shares that she has had discussions with her husband and would be interested in meeting with the hospice liaison for further information on hospice support.  Goals at this time are to improve from an infection perspective and get back home.  Discussed the importance of continued conversation with family and their  medical providers regarding overall plan of care and treatment options, ensuring decisions are within the context of the patients values and GOCs. _______________________ Addendum:  Patient's spouse Erika [name] called and updated as to the above.  He is in agreement with meeting with hospice to further discuss options moving forward.  Decision Maker: Erika [name], Desire (Spouse): [phone] (Mobile   SUMMARY OF RECOMMENDATIONS   DNAR/DNI  Chronic disease trajectory explained and reviewed  Patient and her husband are interested in meeting with the hospice liaison  Ongoing palliative care support  Code Status/Advance Care Planning: DNAR/DNI   Symptom Management:  Left shoulder pain: -Tylenol as needed -Oxycodone 5 mg every 4 hours as needed -Fentanyl 12.5 to 50 mcg every 2 hours as needed for severe pain  Palliative Prophylaxis:  Aspiration, Bowel Regimen, Delirium Protocol, Frequent Pain Assessment, Oral Care, Palliative Wound Care, and Turn Reposition  Additional Recommendations (Limitations, Scope, Preferences): Continue current care  Psycho-social/Spiritual:  Desire for further Chaplaincy support: Yes patient is spiritual Additional Recommendations: Chronic disease trajectory reviewed   Prognosis: High chronic disease burden and  recurrent readmissions Place patient in the hide 94-month mortality risk.  Discharge Planning: Discharge home once medically optimized.  Vitals:   08/13/23 0645 08/13/23 0700  BP: 117/63 94/66  Pulse: 79 86  Resp: 13 18  Temp:    SpO2: 98% 100%    Intake/Output Summary (Last 24 hours) at 08/13/2023 0720 Last data filed at 08/13/2023 0706 Gross per 24 hour  Intake 2767 ml  Output --  Net 2767 ml   Last Weight  Most recent update: 08/12/2023  5:14 PM    Weight  88.9 kg (195 lb 15.8 oz)            Gen: Middle-age Caucasian female in no acute distress HEENT: moist mucous membranes CV: Regular rate and rhythm  PULM: On 6 L nasal cannula breathing is even and nonlabored ABD: soft/nontender  EXT: No edema  Neuro: Alert and oriented x3   PPS: 10%   This conversation/these recommendations were discussed with patient primary care team, Dr. Jacqulyn Bath  Billing based on MDM: High  Problems Addressed: One acute or chronic illness or injury that poses a threat to life or bodily function  Amount and/or Complexity of Data: Category 3:Discussion of management or test interpretation with external physician/other qualified health care professional/appropriate source (not separately reported)  Risks: Decision not to resuscitate or to de-escalate care because of poor prognosis ______________________________________________________ Lamarr Lulas [facility] Palliative Medicine Team Team Cell Phone: [phone] Please  utilize secure chat with additional questions, if there is no response within 30 minutes please call the above phone number  Palliative Medicine Team providers are available by phone from 7am to 7pm daily and can be reached through the team cell phone.  Should this patient require assistance outside of these hours, please call the patient's attending physician.

## 2023-08-13 NOTE — Assessment & Plan Note (Signed)
Gently rehydrate currently appears to be more on the dry side.  Continue to monitor

## 2023-08-13 NOTE — Consult Note (Signed)
Reason for Consult:Left humerus fx Referring Physician: Hughie Closs Time called: 1610 Time at bedside: [address] Erika [name] is an 52 y.o. female.  HPI: Erika [name] fell 5d ago and injured her left shoulder. She just tripped and fell while she was transferring. She is s/p CVA which has left her with a dense left hemiparesis. She returns to the ED last night with delirium and SOB and requested orthopedic consultation for the shoulder. She is RHD, lives at home with her husband, and does not work.  Past Medical History:  Diagnosis Date   Acute respiratory failure with hypoxia (HCC) 06/24/2021   Arthralgia of temporomandibular joint    CAD, multiple vessel    a. 06/2016 Cath: ostLM 40%, ostLAD 40%, pLAD 95%, ost-pLCx 60%, pLCx 95%, mLCx 60%, mRCA 95%, D2 50%, LVSF nl;  b. 07/2016 CABG x 4 (LIMA->LAD, VG->Diag, VG->OM, VG->RCA); c. 08/2016 Cath: 3VD w/ 4/4 patent grafts. LAD distal to LIMA has diff dzs->Med rx; d. 08/2020 Cath: 4/4 patent grafts, native 3VD. EF 55-65%-->Med Rx.   Carotid arterial disease (HCC)    a. 07/2016 s/p R CEA; b. 02/2021 U/S: RICA 40-59%, LICA 1-39%.   Cerebrovascular disease    Clotting disorder (HCC)    Complex sleep apnea syndrome 10/03/2022   Depression    Diastolic dysfunction    a. 06/2016 Echo: EF 50-55%, mild inf wall HK, GR1DD, mild MR, RV sys fxn nl, mildly dilated LA, PASP nl; b. 06/2021 Echo: EF 60-65%, no rwma. Nl RV fxn.   Essential hypertension    Essential hypertension, malignant 06/28/2016   Fatty liver disease, nonalcoholic 2016   History of blood transfusion    with heart surgery   History of MI (myocardial infarction) (July 2017) 08/02/2020   HLD (hyperlipidemia)    HTN (hypertension), malignant 10/20/2013   Labile hypertension    a. prior renal ngiogram negative for RAS in 03/2016; b. catecholamines and metanephrines normal, mildly elevated renin with normal aldosterone and normal ratio in 02/2016   Mild tobacco abuse in early remission 06/28/2016    Myocardial infarction [facility]) 2017   Obesity    PAD (peripheral artery disease) (HCC)    a. 09/2018 s/p L SFA stenting; b. 07/2019 Periph Angio: Patent m/d L SFA stent w/ 100% L SFA distal to stent. L AT 100d, L Peroneal diff dzs-->Med Rx; c. 02/2021 ABIs: stable @ 0.61 on R and 0.46 on L.   PTSD (post-traumatic stress disorder)    Reflux gastritis 07/01/2023   Stroke (HCC)    Tobacco abuse    Type 2 diabetes mellitus (HCC) 12/2015    Past Surgical History:  Procedure Laterality Date   ABDOMINAL AORTOGRAM W/LOWER EXTREMITY [name] 10/15/2018   Procedure: ABDOMINAL AORTOGRAM W/LOWER EXTREMITY;  Surgeon: Iran Ouch, MD;  [facility];  Service: Cardiovascular;  Laterality: [name];   ABDOMINAL AORTOGRAM W/LOWER EXTREMITY Bilateral 08/19/2019   Procedure: ABDOMINAL AORTOGRAM W/LOWER EXTREMITY;  Surgeon: Iran Ouch, MD;  [facility]: [facility];  Service: Cardiovascular;  Laterality: Bilateral;   CARDIAC CATHETERIZATION [name] 06/29/2016   Procedure: Left Heart Cath and Coronary Angiography;  Surgeon: Antonieta Iba, MD;  Location: [facility];  Service: Cardiovascular;  Laterality: [name];   CARDIAC CATHETERIZATION [name] 08/29/2016   Procedure: Left Heart Cath and Cors/Grafts Angiography;  Surgeon: Iran Ouch, MD;  [facility];  Service: Cardiovascular;  Laterality: [name];   CESAREAN SECTION     CHOLECYSTECTOMY  CORONARY ARTERY BYPASS GRAFT [name] 07/06/2016   Procedure: CORONARY ARTERY BYPASS GRAFTING (CABG) x four, using left internal mammary artery and right leg greater saphenous vein harvested endoscopically;  Surgeon: Kerin Perna, MD;  Location: [facility] OR;  Service: Open Heart Surgery;  Laterality: [name];   ENDARTERECTOMY Right 07/06/2016   Procedure: ENDARTERECTOMY CAROTID;  Surgeon: Larina Earthly, MD;  Location: [facility] OR;  Service: Vascular;  Laterality: Right;   ENDARTERECTOMY Right 04/27/2020   Procedure: REDO OF RIGHT ENDARTERECTOMY CAROTID;   Surgeon: Larina Earthly, MD;  Location: [facility] OR;  Service: Vascular;  Laterality: Right;   LEFT HEART CATH AND CORS/GRAFTS ANGIOGRAPHY [name] 08/24/2020   Procedure: LEFT HEART CATH AND CORS/GRAFTS ANGIOGRAPHY;  Surgeon: Iran Ouch, MD;  [facility];  Service: Cardiovascular;  Laterality: [name];   LEFT HEART CATH AND CORS/GRAFTS ANGIOGRAPHY [name] 01/29/2022   Procedure: LEFT HEART CATH AND CORS/GRAFTS ANGIOGRAPHY;  Surgeon: Iran Ouch, MD;  Location: [facility];  Service: Cardiovascular;  Laterality: [name];   PERIPHERAL VASCULAR CATHETERIZATION [name] 04/18/2016   Procedure: Renal Angiography;  Surgeon: Iran Ouch, MD;  [facility];  Service: Cardiovascular;  Laterality: [name];   PERIPHERAL VASCULAR INTERVENTION Left 10/15/2018   Procedure: PERIPHERAL VASCULAR INTERVENTION;  Surgeon: Iran Ouch, MD;  [facility];  Service: Cardiovascular;  Laterality: Left;  Left superficial femoral   TEE WITHOUT CARDIOVERSION [name] 07/06/2016   Procedure: TRANSESOPHAGEAL ECHOCARDIOGRAM (TEE);  Surgeon: Kerin Perna, MD;  Location: [facility] OR;  Service: Open Heart Surgery;  Laterality: [name];   TONSILLECTOMY      Family History  Adopted: Yes  Problem Relation Age of Onset   Diabetes Mother    Diabetes Father    Alcohol abuse Father    Heart disease Father    Drug abuse Father    Stroke Sister    Anxiety disorder Sister     Social History:  reports that she quit smoking about 2 years ago. Her smoking use included cigarettes. She started smoking about 32 years ago. She has a 15 pack-year smoking history. She has never been exposed to tobacco smoke. She has never used smokeless tobacco. She reports that she does not currently use alcohol. She reports that she does not use drugs.  Allergies: No Known Allergies  Medications: I have reviewed the patient's current medications.  Results for orders placed or performed during the hospital encounter of  08/12/23 (from the past 48 hour(s))  Culture, blood (Routine x 2)     Status: None (Preliminary result)   Collection Time: 08/12/23  5:48 PM   Specimen: BLOOD RIGHT FOREARM  Result Value Ref Range   Specimen Description BLOOD RIGHT FOREARM    Special Requests      BOTTLES DRAWN AEROBIC AND ANAEROBIC Blood Culture adequate volume   Culture      NO GROWTH < 12 HOURS Performed at [facility] Lab, [address], [place], [place] [address]    Report Status PENDING   Comprehensive metabolic panel     Status: Abnormal   Collection Time: 08/12/23  5:59 PM  Result Value Ref Range   Sodium 133 (L) 135 - 145 mmol/L   Potassium 4.2 3.5 - 5.1 mmol/L   Chloride 98 98 - 111 mmol/L   CO2 22 22 - 32 mmol/L   Glucose, Bld 163 (H) 70 - 99 mg/dL    Comment: Glucose reference range applies only to samples taken after  fasting for at least 8 hours.   BUN 10 6 - 20 mg/dL   Creatinine, Ser 8.65 0.44 - 1.00 mg/dL   Calcium 9.9 8.9 - 78.4 mg/dL   Total Protein 7.1 6.5 - 8.1 g/dL   Albumin 3.1 (L) 3.5 - 5.0 g/dL   AST 14 (L) 15 - 41 U/L   ALT 12 0 - 44 U/L   Alkaline Phosphatase 50 38 - 126 U/L   Total Bilirubin 0.6 0.3 - 1.2 mg/dL   GFR, Estimated >69 >62 mL/min    Comment: (NOTE) Calculated using the CKD-EPI Creatinine Equation (2021)    Anion gap 13 5 - 15    Comment: Performed at [facility] Lab, [address], [place], [place] [address]  CBC     Status: Abnormal   Collection Time: 08/12/23  5:59 PM  Result Value Ref Range   WBC 18.6 (H) 4.0 - 10.5 K/uL   RBC 4.10 3.87 - 5.11 MIL/uL   Hemoglobin 9.4 (L) 12.0 - 15.0 g/dL   HCT 13.2 (L) 44.0 - 10.2 %   MCV 77.1 (L) 80.0 - 100.0 fL   MCH 22.9 (L) 26.0 - 34.0 pg   MCHC 29.7 (L) 30.0 - 36.0 g/dL   RDW 72.5 (H) 36.6 - 44.0 %   Platelets 509 (H) 150 - 400 K/uL   nRBC 0.0 0.0 - 0.2 %    Comment: Performed at [facility] Lab, [address], [place], [place] [address]  hCG, serum, qualitative     Status: None   Collection Time: 08/12/23   5:59 PM  Result Value Ref Range   Preg, Serum NEGATIVE NEGATIVE    Comment:        THE SENSITIVITY OF THIS METHODOLOGY IS >10 mIU/mL. Performed at [name] Lab, [address], [place], [place] [address]   Protime-INR     Status: None   Collection Time: 08/12/23  5:59 PM  Result Value Ref Range   Prothrombin Time 14.6 11.4 - 15.2 seconds   INR 1.1 0.8 - 1.2    Comment: (NOTE) INR goal varies based on device and disease states. Performed at [facility] Lab, [address] [place], [place] [address]   Culture, blood (Routine x 2)     Status: None (Preliminary result)   Collection Time: 08/12/23  5:59 PM   Specimen: BLOOD RIGHT ARM  Result Value Ref Range   Specimen Description BLOOD RIGHT ARM    Special Requests      BOTTLES DRAWN AEROBIC AND ANAEROBIC Blood Culture results may not be optimal due to an inadequate volume of blood received in culture bottles   Culture      NO GROWTH < 12 HOURS Performed at [facility] Lab, [address], [place], [place] [address]    Report Status PENDING   I-Stat Lactic Acid, ED     Status: None   Collection Time: 08/12/23  6:12 PM  Result Value Ref Range   Lactic Acid, Venous 1.2 0.5 - 1.9 mmol/L  Urinalysis, w/ Reflex to Culture (Infection Suspected) -Urine, Clean Catch     Status: Abnormal   Collection Time: 08/12/23 10:00 PM  Result Value Ref Range   Specimen Source URINE, CATHETERIZED    Color, Urine YELLOW YELLOW   APPearance CLEAR CLEAR   Specific Gravity, Urine 1.009 1.005 - 1.030   pH 7.0 5.0 - 8.0   Glucose, UA NEGATIVE NEGATIVE mg/dL   Hgb urine dipstick NEGATIVE NEGATIVE  Bilirubin Urine NEGATIVE NEGATIVE   Ketones, ur NEGATIVE NEGATIVE mg/dL   Protein, ur NEGATIVE NEGATIVE mg/dL   Nitrite NEGATIVE NEGATIVE   Leukocytes,Ua MODERATE (A) NEGATIVE   RBC / HPF 0-5 0 - 5 RBC/hpf   WBC, UA 6-10 0 - 5 WBC/hpf    Comment:        Reflex urine culture not performed if WBC <=10, OR if Squamous epithelial cells >5.  If Squamous epithelial cells >5 suggest recollection.    Bacteria, UA RARE (A) NONE SEEN   Squamous Epithelial / HPF 0-5 0 - 5 /HPF    Comment: Performed at [facility] Lab, [address], [place], [place] [address]  SARS Coronavirus 2 by RT PCR (hospital order, performed in [facility] hospital lab) *cepheid single result test* Anterior Nasal Swab     Status: None   Collection Time: 08/12/23 11:52 PM   Specimen: Anterior Nasal Swab  Result Value Ref Range   SARS Coronavirus 2 by RT PCR NEGATIVE NEGATIVE    Comment: Performed at [facility] Lab, [address], [place], [place] [address]  Strep pneumoniae urinary antigen     Status: None   Collection Time: 08/13/23 12:01 AM  Result Value Ref Range   Strep Pneumo Urinary Antigen NEGATIVE NEGATIVE    Comment:        Infection due to S. pneumoniae cannot be absolutely ruled out since the antigen present may be below the detection limit of the test. Performed at [facility] Lab, [address], [place], [place] [address]   CBG monitoring, ED     Status: Abnormal   Collection Time: 08/13/23  2:29 AM  Result Value Ref Range   Glucose-Capillary 129 (H) 70 - 99 mg/dL    Comment: Glucose reference range applies only to samples taken after fasting for at least 8 hours.  CBC with Differential     Status: Abnormal   Collection Time: 08/13/23  2:35 AM  Result Value Ref Range   WBC 13.4 (H) 4.0 - 10.5 K/uL   RBC 3.40 (L) 3.87 - 5.11 MIL/uL   Hemoglobin 7.5 (L) 12.0 - 15.0 g/dL    Comment: Reticulocyte Hemoglobin testing may be clinically indicated, consider ordering this additional test [id number]    HCT 25.8 (L) 36.0 - 46.0 %   MCV 75.9 (L) 80.0 - 100.0 fL   MCH 22.1 (L) 26.0 - 34.0 pg   MCHC 29.1 (L) 30.0 - 36.0 g/dL   RDW 62.1 (H) 30.8 - 65.7 %   Platelets 477 (H) 150 - 400 K/uL   nRBC 0.0 0.0 - 0.2 %   Neutrophils Relative % 77 %   Neutro Abs 10.4 (H) 1.7 - 7.7 K/uL   Lymphocytes Relative 15 %   Lymphs Abs 2.0 0.7 -  4.0 K/uL   Monocytes Relative 5 %   Monocytes Absolute 0.7 0.1 - 1.0 K/uL   Eosinophils Relative 1 %   Eosinophils Absolute 0.2 0.0 - 0.5 K/uL   Basophils Relative 0 %   Basophils Absolute 0.0 0.0 - 0.1 K/uL   Immature Granulocytes 2 %   Abs Immature Granulocytes 0.20 (H) 0.00 - 0.07 K/uL    Comment: Performed at [facility] Lab, [address] [place], [place] [address]  APTT     Status: Abnormal   Collection Time: 08/13/23  2:35 AM  Result Value Ref Range   aPTT 44 (H) 24 - 36 seconds    Comment:  IF BASELINE aPTT IS ELEVATED, SUGGEST PATIENT RISK ASSESSMENT BE USED TO DETERMINE APPROPRIATE ANTICOAGULANT THERAPY. Performed at [facility] Lab, [address] [place], [place] [address]   Procalcitonin     Status: None   Collection Time: 08/13/23  2:35 AM  Result Value Ref Range   Procalcitonin 2.60 ng/mL    Comment:        Interpretation: PCT > 2 ng/mL: Systemic infection (sepsis) is likely, unless other causes are known. (NOTE)       Sepsis PCT Algorithm           Lower Respiratory Tract                                      Infection PCT Algorithm    ----------------------------     ----------------------------         PCT < 0.25 ng/mL                PCT < 0.10 ng/mL          Strongly encourage             Strongly discourage   discontinuation of antibiotics    initiation of antibiotics    ----------------------------     -----------------------------       PCT 0.25 - 0.50 ng/mL            PCT 0.10 - 0.25 ng/mL               OR       >80% decrease in PCT            Discourage initiation of                                            antibiotics      Encourage discontinuation           of antibiotics    ----------------------------     -----------------------------         PCT >= 0.50 ng/mL              PCT 0.26 - 0.50 ng/mL               AND       <80% decrease in PCT              Encourage initiation of                                              antibiotics       Encourage continuation           of antibiotics    ----------------------------     -----------------------------        PCT >= 0.50 ng/mL                  PCT > 0.50 ng/mL               AND         increase in PCT                  Strongly encourage  initiation of antibiotics    Strongly encourage escalation           of antibiotics                                     -----------------------------                                           PCT <= 0.25 ng/mL                                                 OR                                        > 80% decrease in PCT                                      Discontinue / Do not initiate                                             antibiotics  Performed at [facility] Lab, [address] [place], [place] [address]   Ammonia     Status: None   Collection Time: 08/13/23  2:35 AM  Result Value Ref Range   Ammonia 22 9 - 35 umol/L    Comment: Performed at [facility] Lab, [address], [place], [place] [address]  CK     Status: None   Collection Time: 08/13/23  2:35 AM  Result Value Ref Range   Total CK 143 38 - 234 U/L    Comment: Performed at [facility] Lab, [address], [place], [place] [address]  Magnesium     Status: None   Collection Time: 08/13/23  2:35 AM  Result Value Ref Range   Magnesium 1.8 1.7 - 2.4 mg/dL    Comment: Performed at [facility] Lab, [address], [place], [place] [address]  Phosphorus     Status: None   Collection Time: 08/13/23  2:35 AM  Result Value Ref Range   Phosphorus 3.2 2.5 - 4.6 mg/dL    Comment: Performed at [facility] Lab, [address] [place], [place] [address]  Prealbumin     Status: Abnormal   Collection Time: 08/13/23  2:35 AM  Result Value Ref Range   Prealbumin 6 (L) 18 - 38 mg/dL    Comment: Performed at [facility] Lab, [address], [place], [place] [address]  TSH     Status: Abnormal   Collection  Time: 08/13/23  2:35 AM  Result Value Ref Range   TSH 5.490 (H) 0.350 - 4.500 uIU/mL    Comment: Performed by a 3rd Generation assay with a functional sensitivity of <=0.01 uIU/mL. Performed at [facility] Lab, [address], [place], [place] [address]   Hemoglobin A1c  Status: Abnormal   Collection Time: 08/13/23  2:35 AM  Result Value Ref Range   Hgb A1c MFr Bld 5.8 (H) 4.8 - 5.6 %    Comment: (NOTE) Pre diabetes:          5.7%-6.4%  Diabetes:              >6.4%  Glycemic control for   <7.0% adults with diabetes    Mean Plasma Glucose 119.76 mg/dL    Comment: Performed at [facility] Lab, [address] [place], [place] [address]  Vitamin B12     Status: None   Collection Time: 08/13/23  2:35 AM  Result Value Ref Range   Vitamin B-12 705 180 - 914 pg/mL    Comment: (NOTE) This assay is not validated for testing neonatal or myeloproliferative syndrome specimens for Vitamin B12 levels. Performed at [facility] Lab, [address] [place], [place] [address]   Folate     Status: None   Collection Time: 08/13/23  2:35 AM  Result Value Ref Range   Folate 16.2 >5.9 ng/mL    Comment: Performed at [facility] Lab, [address], [place], [place] [address]  Iron and TIBC     Status: Abnormal   Collection Time: 08/13/23  2:35 AM  Result Value Ref Range   Iron 8 (L) 28 - 170 ug/dL   TIBC 244 010 - 272 ug/dL   Saturation Ratios 3 (L) 10.4 - 31.8 %   UIBC 275 ug/dL    Comment: Performed at [facility] Lab, [address], [place], [place] [address]  Ferritin     Status: None   Collection Time: 08/13/23  2:35 AM  Result Value Ref Range   Ferritin 51 11 - 307 ng/mL    Comment: Performed at [facility] Lab, [address], [place], [place] [address]  Reticulocytes     Status: Abnormal   Collection Time: 08/13/23  2:35 AM  Result Value Ref Range   Retic Ct Pct 1.9 0.4 - 3.1 %   RBC. 3.30 (L) 3.87 - 5.11 MIL/uL   Retic Count, Absolute 61.4 19.0 - 186.0  K/uL   Immature Retic Fract 23.6 (H) 2.3 - 15.9 %    Comment: Performed at [facility] Lab, [address], [place], [place] [address]  I-Stat Lactic Acid, ED     Status: None   Collection Time: 08/13/23  3:13 AM  Result Value Ref Range   Lactic Acid, Venous 0.5 0.5 - 1.9 mmol/L  I-Stat venous blood gas, ED     Status: Abnormal   Collection Time: 08/13/23  3:13 AM  Result Value Ref Range   pH, Ven 7.391 7.25 - 7.43   pCO2, Ven 46.5 44 - 60 mmHg   pO2, Ven 163 (H) 32 - 45 mmHg   Bicarbonate 28.2 (H) 20.0 - 28.0 mmol/L   TCO2 30 22 - 32 mmol/L   O2 Saturation 99 %   Acid-Base Excess 3.0 (H) 0.0 - 2.0 mmol/L   Sodium 139 135 - 145 mmol/L   Potassium 3.6 3.5 - 5.1 mmol/L   Calcium, Ion 1.30 1.15 - 1.40 mmol/L   HCT 24.0 (L) 36.0 - 46.0 %   Hemoglobin 8.2 (L) 12.0 - 15.0 g/dL   Sample type VENOUS   Magnesium     Status: None   Collection Time: 08/13/23  4:07 AM  Result Value Ref Range   Magnesium 1.9 1.7 - 2.4 mg/dL  Comment: Performed at [facility] Lab, [address], [place], [place] [address]  Phosphorus     Status: None   Collection Time: 08/13/23  4:07 AM  Result Value Ref Range   Phosphorus 3.3 2.5 - 4.6 mg/dL    Comment: Performed at [facility] Lab, [address] [place], [place] [address]  Comprehensive metabolic panel     Status: Abnormal   Collection Time: 08/13/23  4:07 AM  Result Value Ref Range   Sodium 137 135 - 145 mmol/L   Potassium 3.5 3.5 - 5.1 mmol/L   Chloride 102 98 - 111 mmol/L   CO2 27 22 - 32 mmol/L   Glucose, Bld 122 (H) 70 - 99 mg/dL    Comment: Glucose reference range applies only to samples taken after fasting for at least 8 hours.   BUN 8 6 - 20 mg/dL   Creatinine, Ser 0.98 0.44 - 1.00 mg/dL   Calcium 9.4 8.9 - 11.9 mg/dL   Total Protein 6.1 (L) 6.5 - 8.1 g/dL   Albumin 2.5 (L) 3.5 - 5.0 g/dL   AST 12 (L) 15 - 41 U/L   ALT 9 0 - 44 U/L   Alkaline Phosphatase 41 38 - 126 U/L   Total Bilirubin 0.4 0.3 - 1.2 mg/dL   GFR,  Estimated >14 >78 mL/min    Comment: (NOTE) Calculated using the CKD-EPI Creatinine Equation (2021)    Anion gap 8 5 - 15    Comment: Performed at [facility] Lab, [address], [place], [place] [address]  CBC     Status: Abnormal   Collection Time: 08/13/23  4:07 AM  Result Value Ref Range   WBC 12.4 (H) 4.0 - 10.5 K/uL   RBC 3.34 (L) 3.87 - 5.11 MIL/uL   Hemoglobin 7.4 (L) 12.0 - 15.0 g/dL    Comment: Reticulocyte Hemoglobin testing may be clinically indicated, consider ordering this additional test [id number]    HCT 25.5 (L) 36.0 - 46.0 %   MCV 76.3 (L) 80.0 - 100.0 fL   MCH 22.2 (L) 26.0 - 34.0 pg   MCHC 29.0 (L) 30.0 - 36.0 g/dL   RDW 46.9 (H) 62.9 - 52.8 %   Platelets 456 (H) 150 - 400 K/uL   nRBC 0.0 0.0 - 0.2 %    Comment: Performed at [facility] Lab, [address] [place], [place] [address]  I-Stat CG4 Lactic Acid     Status: None   Collection Time: 08/13/23  5:07 AM  Result Value Ref Range   Lactic Acid, Venous 0.7 0.5 - 1.9 mmol/L  CBG monitoring, ED     Status: Abnormal   Collection Time: 08/13/23  7:50 AM  Result Value Ref Range   Glucose-Capillary 119 (H) 70 - 99 mg/dL    Comment: Glucose reference range applies only to samples taken after fasting for at least 8 hours.  CBG monitoring, ED     Status: Abnormal   Collection Time: 08/13/23  8:21 AM  Result Value Ref Range   Glucose-Capillary 122 (H) 70 - 99 mg/dL    Comment: Glucose reference range applies only to samples taken after fasting for at least 8 hours.   *Note: Due to a large number of results and/or encounters for the requested time period, some results have not been displayed. A complete set of results can be found in Results Review.    CT HEAD WO CONTRAST ( )  Result Date: 08/13/2023 CLINICAL DATA:  Acute  on chronic weakness and confusion, stroke suspected EXAM: CT HEAD WITHOUT CONTRAST TECHNIQUE: Contiguous axial images were obtained from the base of the skull through the vertex  without intravenous contrast. RADIATION DOSE REDUCTION: This exam was performed according to the departmental dose-optimization program which includes automated exposure control, adjustment of the mA and/or kV according to patient size and/or use of iterative reconstruction technique. COMPARISON:  08/08/2023 FINDINGS: Brain: No evidence of acute infarction, hemorrhage, mass, mass effect, or midline shift. No hydrocephalus or extra-axial fluid collection. Redemonstrated right parietal and occipital encephalomalacia, consistent with remote infarcts. Redemonstrated chronic lacunar infarct in right pons. Vascular: No hyperdense vessel. Atherosclerotic calcifications in the intracranial carotid and vertebral arteries. Skull: Negative for fracture or focal lesion. Sinuses/Orbits: Minimal mucosal thickening in the ethmoid air cells. No acute finding in the orbits. Other: The mastoid air cells are well aerated. IMPRESSION: No acute intracranial process. Electronically Signed   By: Wiliam Ke M.D.   On: 08/13/2023 01:36   DG Chest 2 View  Result Date: 08/12/2023 CLINICAL DATA:  Altered mental status.  Suspected sepsis. EXAM: CHEST - 2 VIEW COMPARISON:  07/30/2023 year.  CT 08/08/2023 FINDINGS: Prior CABG. Cardiomegaly. Low lung volumes. Bilateral lower lobe airspace opacities are noted. No visible effusions. No acute bony abnormality. IMPRESSION: Very low lung volumes with bibasilar atelectasis or infiltrates. Cardiomegaly. Electronically Signed   By: Charlett Nose M.D.   On: 08/12/2023 20:12    Review of Systems  HENT:  Negative for ear discharge, ear pain, hearing loss and tinnitus.   Eyes:  Negative for photophobia and pain.  Respiratory:  Negative for cough and shortness of breath.   Cardiovascular:  Negative for chest pain.  Gastrointestinal:  Negative for abdominal pain, nausea and vomiting.  Genitourinary:  Negative for dysuria, flank pain, frequency and urgency.  Musculoskeletal:  Positive for  arthralgias (Left shoulder). Negative for back pain, myalgias and neck pain.  Neurological:  Negative for dizziness and headaches.  Hematological:  Does not bruise/bleed easily.  Psychiatric/Behavioral:  The patient is not nervous/anxious.    Blood pressure 126/68, pulse 84, temperature 98.1 F (36.7 C), temperature source Oral, resp. rate 20, height 5\' 7"  (1.702 m), weight 88.9 kg, SpO2 94%. Physical Exam Constitutional:      General: She is not in acute distress.    Appearance: She is well-developed. She is not diaphoretic.  HENT:     Head: Normocephalic and atraumatic.  Eyes:     General: No scleral icterus.       Right eye: No discharge.        Left eye: No discharge.     Conjunctiva/sclera: Conjunctivae normal.  Cardiovascular:     Rate and Rhythm: Normal rate and regular rhythm.  Pulmonary:     Effort: Pulmonary effort is normal. No respiratory distress.  Musculoskeletal:     Cervical back: Normal range of motion.     Comments: Left shoulder, elbow, wrist, digits- no skin wounds, mod TTP shoulder, no instability, no blocks to motion  Sens  Ax/R/M/U intact  Mot   Ax/ R/ PIN/ M/ AIN/ U absent  Rad 1+  Skin:    General: Skin is warm and dry.  Neurological:     Mental Status: She is alert.  Psychiatric:        Mood and Affect: Mood normal.        Behavior: Behavior normal.     Assessment/Plan: Left proximal humerus fx -- Plan non-operative management with sling and NWB. She may  f/u with Dr. Aundria Rud prn upon discharge.    Freeman Caldron, PA-C Orthopedic Surgery [phone] 08/13/2023, 10:36 AM

## 2023-08-13 NOTE — ED Notes (Signed)
ED TO INPATIENT HANDOFF REPORT  ED Nurse Name and Phone #: [phone]  S Name/Age/Gender Erika [name] 52 y.o. female Room/Bed: 006C/006C  Code Status   Code Status: DNR  Home/SNF/Other Home Patient oriented to: self, place, time, and situation Is this baseline? No   Triage Complete: Triage complete  Chief Complaint HCAP (healthcare-associated pneumonia) [J18.9]  Triage Note Pt BIB [facility] from pain clinic. Pt sent to ED due to acute on chronic weakness and confusion. Symptoms with gradual worsening x 2 weeks. Pt has L side paralysis at baseline due to hx. Pt with hx of CVA, CABG.    EMS Vitals   112/81 SpO2 93% on 4L O2  CBG 182   Allergies No Known Allergies  Level of Care/Admitting Diagnosis ED Disposition     ED Disposition  Admit   Condition  --   Comment  Hospital Area: [facility] [[id number]]  Level of Care: Progressive [102]  Admit to Progressive based on following criteria: MULTISYSTEM THREATS such as stable sepsis, metabolic/electrolyte imbalance with or without encephalopathy that is responding to early treatment.  Admit to Progressive based on following criteria: RESPIRATORY PROBLEMS hypoxemic/hypercapnic respiratory failure that is responsive to NIPPV (BiPAP) or High Flow Nasal Cannula (6-80 lpm). Frequent assessment/intervention, no > Q2 hrs < Q4 hrs, to maintain oxygenation and pulmonary hygiene.  May admit patient to [facility] or [facility] if equivalent level of care is available:: No  Covid Evaluation: Asymptomatic - no recent exposure (last 10 days) testing not required  Diagnosis: HCAP (healthcare-associated pneumonia) [332951]  Admitting Physician: Therisa Doyne [[id number]]  Attending Physician: Therisa Doyne [[id number]]  Certification:: I certify this patient will need inpatient services for at least 2 midnights  Expected Medical Readiness: 08/15/2023          B Medical/Surgery History Past Medical History:   Diagnosis Date   Acute respiratory failure with hypoxia (HCC) 06/24/2021   Arthralgia of temporomandibular joint    CAD, multiple vessel    a. 06/2016 Cath: ostLM 40%, ostLAD 40%, pLAD 95%, ost-pLCx 60%, pLCx 95%, mLCx 60%, mRCA 95%, D2 50%, LVSF nl;  b. 07/2016 CABG x 4 (LIMA->LAD, VG->Diag, VG->OM, VG->RCA); c. 08/2016 Cath: 3VD w/ 4/4 patent grafts. LAD distal to LIMA has diff dzs->Med rx; d. 08/2020 Cath: 4/4 patent grafts, native 3VD. EF 55-65%-->Med Rx.   Carotid arterial disease (HCC)    a. 07/2016 s/p R CEA; b. 02/2021 U/S: RICA 40-59%, LICA 1-39%.   Cerebrovascular disease    Clotting disorder (HCC)    Complex sleep apnea syndrome 10/03/2022   Depression    Diastolic dysfunction    a. 06/2016 Echo: EF 50-55%, mild inf wall HK, GR1DD, mild MR, RV sys fxn nl, mildly dilated LA, PASP nl; b. 06/2021 Echo: EF 60-65%, no rwma. Nl RV fxn.   Essential hypertension    Essential hypertension, malignant 06/28/2016   Fatty liver disease, nonalcoholic 2016   History of blood transfusion    with heart surgery   History of MI (myocardial infarction) (July 2017) 08/02/2020   HLD (hyperlipidemia)    HTN (hypertension), malignant 10/20/2013   Labile hypertension    a. prior renal ngiogram negative for RAS in 03/2016; b. catecholamines and metanephrines normal, mildly elevated renin with normal aldosterone and normal ratio in 02/2016   Mild tobacco abuse in early remission 06/28/2016   Myocardial infarction [facility]) 2017   Obesity    PAD (peripheral artery disease) (HCC)    a. 09/2018 s/p L SFA stenting;  b. 07/2019 Periph Angio: Patent m/d L SFA stent w/ 100% L SFA distal to stent. L AT 100d, L Peroneal diff dzs-->Med Rx; c. 02/2021 ABIs: stable @ 0.61 on R and 0.46 on L.   PTSD (post-traumatic stress disorder)    Reflux gastritis 07/01/2023   Stroke (HCC)    Tobacco abuse    Type 2 diabetes mellitus (HCC) 12/2015   Past Surgical History:  Procedure Laterality Date   ABDOMINAL AORTOGRAM W/LOWER  EXTREMITY [name] 10/15/2018   Procedure: ABDOMINAL AORTOGRAM W/LOWER EXTREMITY;  Surgeon: Iran Ouch, MD;  [facility];  Service: Cardiovascular;  Laterality: [name];   ABDOMINAL AORTOGRAM W/LOWER EXTREMITY Bilateral 08/19/2019   Procedure: ABDOMINAL AORTOGRAM W/LOWER EXTREMITY;  Surgeon: Iran Ouch, MD;  [facility]: [facility];  Service: Cardiovascular;  Laterality: Bilateral;   CARDIAC CATHETERIZATION [name] 06/29/2016   Procedure: Left Heart Cath and Coronary Angiography;  Surgeon: Antonieta Iba, MD;  Location: [facility] INVASIVE CV LAB;  Service: Cardiovascular;  Laterality: [name];   CARDIAC CATHETERIZATION [name] 08/29/2016   Procedure: Left Heart Cath and Cors/Grafts Angiography;  Surgeon: Iran Ouch, MD;  [facility];  Service: Cardiovascular;  Laterality: [name];   CESAREAN SECTION     CHOLECYSTECTOMY     CORONARY ARTERY BYPASS GRAFT [name] 07/06/2016   Procedure: CORONARY ARTERY BYPASS GRAFTING (CABG) x four, using left internal mammary artery and right leg greater saphenous vein harvested endoscopically;  Surgeon: Kerin Perna, MD;  Location[facility] OR;  Service: Open Heart Surgery;  Laterality: [name];   ENDARTERECTOMY Right 07/06/2016   Procedure: ENDARTERECTOMY CAROTID;  Surgeon: Larina Earthly, MD;  Location: [facility] OR;  Service: Vascular;  Laterality: Right;   ENDARTERECTOMY Right 04/27/2020   Procedure: REDO OF RIGHT ENDARTERECTOMY CAROTID;  Surgeon: Larina Earthly, MD;  Location: [facility] OR;  Service: Vascular;  Laterality: Right;   LEFT HEART CATH AND CORS/GRAFTS ANGIOGRAPHY [name] 08/24/2020   Procedure: LEFT HEART CATH AND CORS/GRAFTS ANGIOGRAPHY;  Surgeon: Iran Ouch, MD;  [facility];  Service: Cardiovascular;  Laterality: [name];   LEFT HEART CATH AND CORS/GRAFTS ANGIOGRAPHY [name] 01/29/2022   Procedure: LEFT HEART CATH AND CORS/GRAFTS ANGIOGRAPHY;  Surgeon: Iran Ouch, MD;  Location: [facility] INVASIVE CV LAB;  Service: Cardiovascular;   Laterality: [name];   PERIPHERAL VASCULAR CATHETERIZATION [name] 04/18/2016   Procedure: Renal Angiography;  Surgeon: Iran Ouch, MD;  [facility];  Service: Cardiovascular;  Laterality: [name];   PERIPHERAL VASCULAR INTERVENTION Left 10/15/2018   Procedure: PERIPHERAL VASCULAR INTERVENTION;  Surgeon: Iran Ouch, MD;  [facility];  Service: Cardiovascular;  Laterality: Left;  Left superficial femoral   TEE WITHOUT CARDIOVERSION [name] 07/06/2016   Procedure: TRANSESOPHAGEAL ECHOCARDIOGRAM (TEE);  Surgeon: Kerin Perna, MD;  Location: [facility] OR;  Service: Open Heart Surgery;  Laterality: [name];   TONSILLECTOMY       A IV Location/Drains/Wounds Patient Lines/Drains/Airways Status     Active Line/Drains/Airways     Name Placement date Placement time Site Days   Peripheral IV 08/12/23 20 G 1" Right;Posterior Forearm 08/12/23  1749  Forearm  1   Pressure Injury 01/26/22 Sacrum Right Stage 2 -  Partial thickness loss of dermis presenting as a shallow open injury with a red, pink wound bed without slough. 01/26/22  2130  -- 564            Intake/Output Last 24 hours  Intake/Output Summary (Last 24 hours)  at 08/13/2023 1745 Last data filed at 08/13/2023 0706 Gross per 24 hour  Intake 2767 ml  Output --  Net 2767 ml    Labs/Imaging Results for orders placed or performed during the hospital encounter of 08/12/23 (from the past 48 hour(s))  Culture, blood (Routine x 2)     Status: None (Preliminary result)   Collection Time: 08/12/23  5:48 PM   Specimen: BLOOD RIGHT FOREARM  Result Value Ref Range   Specimen Description BLOOD RIGHT FOREARM    Special Requests      BOTTLES DRAWN AEROBIC AND ANAEROBIC Blood Culture adequate volume   Culture      NO GROWTH < 12 HOURS Performed at [facility] Lab, [address], [place], [place] [address]    Report Status PENDING   Comprehensive metabolic panel     Status: Abnormal   Collection Time: 08/12/23  5:59  PM  Result Value Ref Range   Sodium 133 (L) 135 - 145 mmol/L   Potassium 4.2 3.5 - 5.1 mmol/L   Chloride 98 98 - 111 mmol/L   CO2 22 22 - 32 mmol/L   Glucose, Bld 163 (H) 70 - 99 mg/dL    Comment: Glucose reference range applies only to samples taken after fasting for at least 8 hours.   BUN 10 6 - 20 mg/dL   Creatinine, Ser 6.04 0.44 - 1.00 mg/dL   Calcium 9.9 8.9 - 54.0 mg/dL   Total Protein 7.1 6.5 - 8.1 g/dL   Albumin 3.1 (L) 3.5 - 5.0 g/dL   AST 14 (L) 15 - 41 U/L   ALT 12 0 - 44 U/L   Alkaline Phosphatase 50 38 - 126 U/L   Total Bilirubin 0.6 0.3 - 1.2 mg/dL   GFR, Estimated >98 >11 mL/min    Comment: (NOTE) Calculated using the CKD-EPI Creatinine Equation (2021)    Anion gap 13 5 - 15    Comment: Performed at [facility], [address] [place], [place] [address]  CBC     Status: Abnormal   Collection Time: 08/12/23  5:59 PM  Result Value Ref Range   WBC 18.6 (H) 4.0 - 10.5 K/uL   RBC 4.10 3.87 - 5.11 MIL/uL   Hemoglobin 9.4 (L) 12.0 - 15.0 g/dL   HCT 29.5 (L) 62.1 - 30.8 %   MCV 77.1 (L) 80.0 - 100.0 fL   MCH 22.9 (L) 26.0 - 34.0 pg   MCHC 29.7 (L) 30.0 - 36.0 g/dL   RDW 65.7 (H) 84.6 - 96.2 %   Platelets 509 (H) 150 - 400 K/uL   nRBC 0.0 0.0 - 0.2 %    Comment: Performed at [facility] Lab, [address] [place], [place] [address]  hCG, serum, qualitative     Status: None   Collection Time: 08/12/23  5:59 PM  Result Value Ref Range   Preg, Serum NEGATIVE NEGATIVE    Comment:        THE SENSITIVITY OF THIS METHODOLOGY IS >10 mIU/mL. Performed at [facility], [address], [place], [place] [address]   Protime-INR     Status: None   Collection Time: 08/12/23  5:59 PM  Result Value Ref Range   Prothrombin Time 14.6 11.4 - 15.2 seconds   INR 1.1 0.8 - 1.2    Comment: (NOTE) INR goal varies based on device and disease states. Performed at [facility], [address] [place], [place] [address]  Culture, blood (Routine x 2)      Status: None (Preliminary result)   Collection Time: 08/12/23  5:59 PM   Specimen: BLOOD RIGHT ARM  Result Value Ref Range   Specimen Description BLOOD RIGHT ARM    Special Requests      BOTTLES DRAWN AEROBIC AND ANAEROBIC Blood Culture results may not be optimal due to an inadequate volume of blood received in culture bottles   Culture      NO GROWTH < 12 HOURS Performed at [facility] Lab, [address], [place], [place] [address]    Report Status PENDING   I-Stat Lactic Acid, ED     Status: None   Collection Time: 08/12/23  6:12 PM  Result Value Ref Range   Lactic Acid, Venous 1.2 0.5 - 1.9 mmol/L  Urinalysis, w/ Reflex to Culture (Infection Suspected) -Urine, Clean Catch     Status: Abnormal   Collection Time: 08/12/23 10:00 PM  Result Value Ref Range   Specimen Source URINE, CATHETERIZED    Color, Urine YELLOW YELLOW   APPearance CLEAR CLEAR   Specific Gravity, Urine 1.009 1.005 - 1.030   pH 7.0 5.0 - 8.0   Glucose, UA NEGATIVE NEGATIVE mg/dL   Hgb urine dipstick NEGATIVE NEGATIVE   Bilirubin Urine NEGATIVE NEGATIVE   Ketones, ur NEGATIVE NEGATIVE mg/dL   Protein, ur NEGATIVE NEGATIVE mg/dL   Nitrite NEGATIVE NEGATIVE   Leukocytes,Ua MODERATE (A) NEGATIVE   RBC / HPF 0-5 0 - 5 RBC/hpf   WBC, UA 6-10 0 - 5 WBC/hpf    Comment:        Reflex urine culture not performed if WBC <=10, OR if Squamous epithelial cells >5. If Squamous epithelial cells >5 suggest recollection.    Bacteria, UA RARE (A) NONE SEEN   Squamous Epithelial / HPF 0-5 0 - 5 /HPF    Comment: Performed at [facility] Lab, [address] [place], [place] [address]  SARS Coronavirus 2 by RT PCR (hospital order, performed in [facility] hospital lab) *cepheid single result test* Anterior Nasal Swab     Status: None   Collection Time: 08/12/23 11:52 PM   Specimen: Anterior Nasal Swab  Result Value Ref Range   SARS Coronavirus 2 by RT PCR NEGATIVE NEGATIVE    Comment: Performed at [facility], [address] [place], [place] [address]  Strep pneumoniae urinary antigen     Status: None   Collection Time: 08/13/23 12:01 AM  Result Value Ref Range   Strep Pneumo Urinary Antigen NEGATIVE NEGATIVE    Comment:        Infection due to S. pneumoniae cannot be absolutely ruled out since the antigen present may be below the detection limit of the test. Performed at [facility], [address], [place], [place] [address]   CBG monitoring, ED     Status: Abnormal   Collection Time: 08/13/23  2:29 AM  Result Value Ref Range   Glucose-Capillary 129 (H) 70 - 99 mg/dL    Comment: Glucose reference range applies only to samples taken after fasting for at least 8 hours.  CBC with Differential     Status: Abnormal   Collection Time: 08/13/23  2:35 AM  Result Value Ref Range   WBC 13.4 (H) 4.0 - 10.5 K/uL   RBC 3.40 (L) 3.87 - 5.11 MIL/uL   Hemoglobin 7.5 (L) 12.0 - 15.0 g/dL    Comment: Reticulocyte Hemoglobin testing may be clinically  indicated, consider ordering this additional test [id number]    HCT 25.8 (L) 36.0 - 46.0 %   MCV 75.9 (L) 80.0 - 100.0 fL   MCH 22.1 (L) 26.0 - 34.0 pg   MCHC 29.1 (L) 30.0 - 36.0 g/dL   RDW 11.9 (H) 14.7 - 82.9 %   Platelets 477 (H) 150 - 400 K/uL   nRBC 0.0 0.0 - 0.2 %   Neutrophils Relative % 77 %   Neutro Abs 10.4 (H) 1.7 - 7.7 K/uL   Lymphocytes Relative 15 %   Lymphs Abs 2.0 0.7 - 4.0 K/uL   Monocytes Relative 5 %   Monocytes Absolute 0.7 0.1 - 1.0 K/uL   Eosinophils Relative 1 %   Eosinophils Absolute 0.2 0.0 - 0.5 K/uL   Basophils Relative 0 %   Basophils Absolute 0.0 0.0 - 0.1 K/uL   Immature Granulocytes 2 %   Abs Immature Granulocytes 0.20 (H) 0.00 - 0.07 K/uL    Comment: Performed at [facility], [address], [place], [place] [address]  APTT     Status: Abnormal   Collection Time: 08/13/23  2:35 AM  Result Value Ref Range   aPTT 44 (H) 24 - 36 seconds    Comment:        IF BASELINE aPTT IS  ELEVATED, SUGGEST PATIENT RISK ASSESSMENT BE USED TO DETERMINE APPROPRIATE ANTICOAGULANT THERAPY. Performed at [facility], [address], [place], [place] [address]   Procalcitonin     Status: None   Collection Time: 08/13/23  2:35 AM  Result Value Ref Range   Procalcitonin 2.60 ng/mL    Comment:        Interpretation: PCT > 2 ng/mL: Systemic infection (sepsis) is likely, unless other causes are known. (NOTE)       Sepsis PCT Algorithm           Lower Respiratory Tract                                      Infection PCT Algorithm    ----------------------------     ----------------------------         PCT < 0.25 ng/mL                PCT < 0.10 ng/mL          Strongly encourage             Strongly discourage   discontinuation of antibiotics    initiation of antibiotics    ----------------------------     -----------------------------       PCT 0.25 - 0.50 ng/mL            PCT 0.10 - 0.25 ng/mL               OR       >80% decrease in PCT            Discourage initiation of                                            antibiotics      Encourage discontinuation           of antibiotics    ----------------------------     -----------------------------         PCT >=  0.50 ng/mL              PCT 0.26 - 0.50 ng/mL               AND       <80% decrease in PCT              Encourage initiation of                                             antibiotics       Encourage continuation           of antibiotics    ----------------------------     -----------------------------        PCT >= 0.50 ng/mL                  PCT > 0.50 ng/mL               AND         increase in PCT                  Strongly encourage                                      initiation of antibiotics    Strongly encourage escalation           of antibiotics                                     -----------------------------                                           PCT <= 0.25 ng/mL                                                  OR                                        > 80% decrease in PCT                                      Discontinue / Do not initiate                                             antibiotics  Performed at [facility], [address] [place], [place] [address]   Ammonia     Status: None   Collection Time: 08/13/23  2:35 AM  Result Value Ref Range   Ammonia 22 9 - 35 umol/L    Comment: Performed at [facility], [address] [place], [place] [address]  CK  Status: None   Collection Time: 08/13/23  2:35 AM  Result Value Ref Range   Total CK 143 38 - 234 U/L    Comment: Performed at [facility], [address] [place], [place] [address]  Magnesium     Status: None   Collection Time: 08/13/23  2:35 AM  Result Value Ref Range   Magnesium 1.8 1.7 - 2.4 mg/dL    Comment: Performed at [facility], [address] [place], [place] [address]  Phosphorus     Status: None   Collection Time: 08/13/23  2:35 AM  Result Value Ref Range   Phosphorus 3.2 2.5 - 4.6 mg/dL    Comment: Performed at [facility], [address] [place], [place] [address]  Prealbumin     Status: Abnormal   Collection Time: 08/13/23  2:35 AM  Result Value Ref Range   Prealbumin 6 (L) 18 - 38 mg/dL    Comment: Performed at [facility] Lab, [address], [place], [place] [address]  TSH     Status: Abnormal   Collection Time: 08/13/23  2:35 AM  Result Value Ref Range   TSH 5.490 (H) 0.350 - 4.500 uIU/mL    Comment: Performed by a 3rd Generation assay with a functional sensitivity of <=0.01 uIU/mL. Performed at [facility], [address] [place], [place] [address]   Hemoglobin A1c     Status: Abnormal   Collection Time: 08/13/23  2:35 AM  Result Value Ref Range   Hgb A1c MFr Bld 5.8 (H) 4.8 - 5.6 %    Comment: (NOTE) Pre diabetes:          5.7%-6.4%  Diabetes:              >6.4%  Glycemic control for   <7.0% adults with diabetes    Mean  Plasma Glucose 119.76 mg/dL    Comment: Performed at [facility] Lab, [address] [place], [place] [address]  Vitamin B12     Status: None   Collection Time: 08/13/23  2:35 AM  Result Value Ref Range   Vitamin B-12 705 180 - 914 pg/mL    Comment: (NOTE) This assay is not validated for testing neonatal or myeloproliferative syndrome specimens for Vitamin B12 levels. Performed at [facility], [address], [place], [place] [address]   Folate     Status: None   Collection Time: 08/13/23  2:35 AM  Result Value Ref Range   Folate 16.2 >5.9 ng/mL    Comment: Performed at [facility] Lab, [address], [place], [place] [address]  Iron and TIBC     Status: Abnormal   Collection Time: 08/13/23  2:35 AM  Result Value Ref Range   Iron 8 (L) 28 - 170 ug/dL   TIBC 284 132 - 440 ug/dL   Saturation Ratios 3 (L) 10.4 - 31.8 %   UIBC 275 ug/dL    Comment: Performed at [facility] Lab, [address] [place], [place] [address]  Ferritin     Status: None   Collection Time: 08/13/23  2:35 AM  Result Value Ref Range   Ferritin 51 11 - 307 ng/mL    Comment: Performed at [facility], [address], [place], [place] [address]  Reticulocytes     Status: Abnormal   Collection Time: 08/13/23  2:35 AM  Result Value Ref Range   Retic Ct Pct 1.9 0.4 -  3.1 %   RBC. 3.30 (L) 3.87 - 5.11 MIL/uL   Retic Count, Absolute 61.4 19.0 - 186.0 K/uL   Immature Retic Fract 23.6 (H) 2.3 - 15.9 %    Comment: Performed at [facility], [address], [place], [place] [address]  I-Stat Lactic Acid, ED     Status: None   Collection Time: 08/13/23  3:13 AM  Result Value Ref Range   Lactic Acid, Venous 0.5 0.5 - 1.9 mmol/L  I-Stat venous blood gas, ED     Status: Abnormal   Collection Time: 08/13/23  3:13 AM  Result Value Ref Range   pH, Ven 7.391 7.25 - 7.43   pCO2, Ven 46.5 44 - 60 mmHg   pO2, Ven 163 (H) 32 - 45 mmHg   Bicarbonate 28.2 (H) 20.0 - 28.0 mmol/L   TCO2 30 22 -  32 mmol/L   O2 Saturation 99 %   Acid-Base Excess 3.0 (H) 0.0 - 2.0 mmol/L   Sodium 139 135 - 145 mmol/L   Potassium 3.6 3.5 - 5.1 mmol/L   Calcium, Ion 1.30 1.15 - 1.40 mmol/L   HCT 24.0 (L) 36.0 - 46.0 %   Hemoglobin 8.2 (L) 12.0 - 15.0 g/dL   Sample type VENOUS   Magnesium     Status: None   Collection Time: 08/13/23  4:07 AM  Result Value Ref Range   Magnesium 1.9 1.7 - 2.4 mg/dL    Comment: Performed at [facility] Lab, [address] [place], [place] [address]  Phosphorus     Status: None   Collection Time: 08/13/23  4:07 AM  Result Value Ref Range   Phosphorus 3.3 2.5 - 4.6 mg/dL    Comment: Performed at [facility] Lab, [address], [place], [place] [address]  Comprehensive metabolic panel     Status: Abnormal   Collection Time: 08/13/23  4:07 AM  Result Value Ref Range   Sodium 137 135 - 145 mmol/L   Potassium 3.5 3.5 - 5.1 mmol/L   Chloride 102 98 - 111 mmol/L   CO2 27 22 - 32 mmol/L   Glucose, Bld 122 (H) 70 - 99 mg/dL    Comment: Glucose reference range applies only to samples taken after fasting for at least 8 hours.   BUN 8 6 - 20 mg/dL   Creatinine, Ser 6.21 0.44 - 1.00 mg/dL   Calcium 9.4 8.9 - 30.8 mg/dL   Total Protein 6.1 (L) 6.5 - 8.1 g/dL   Albumin 2.5 (L) 3.5 - 5.0 g/dL   AST 12 (L) 15 - 41 U/L   ALT 9 0 - 44 U/L   Alkaline Phosphatase 41 38 - 126 U/L   Total Bilirubin 0.4 0.3 - 1.2 mg/dL   GFR, Estimated >65 >78 mL/min    Comment: (NOTE) Calculated using the CKD-EPI Creatinine Equation (2021)    Anion gap 8 5 - 15    Comment: Performed at [facility], [address] [place], [place] [address]  CBC     Status: Abnormal   Collection Time: 08/13/23  4:07 AM  Result Value Ref Range   WBC 12.4 (H) 4.0 - 10.5 K/uL   RBC 3.34 (L) 3.87 - 5.11 MIL/uL   Hemoglobin 7.4 (L) 12.0 - 15.0 g/dL    Comment: Reticulocyte Hemoglobin testing may be clinically indicated, consider ordering this additional test [id number]    HCT 25.5 (L) 36.0 -  46.0 %   MCV 76.3 (L) 80.0 -  100.0 fL   MCH 22.2 (L) 26.0 - 34.0 pg   MCHC 29.0 (L) 30.0 - 36.0 g/dL   RDW 44.0 (H) 34.7 - 42.5 %   Platelets 456 (H) 150 - 400 K/uL   nRBC 0.0 0.0 - 0.2 %    Comment: Performed at [facility], [address], [place], [place] [address]  I-Stat CG4 Lactic Acid     Status: None   Collection Time: 08/13/23  5:07 AM  Result Value Ref Range   Lactic Acid, Venous 0.7 0.5 - 1.9 mmol/L  CBG monitoring, ED     Status: Abnormal   Collection Time: 08/13/23  7:50 AM  Result Value Ref Range   Glucose-Capillary 119 (H) 70 - 99 mg/dL    Comment: Glucose reference range applies only to samples taken after fasting for at least 8 hours.  CBG monitoring, ED     Status: Abnormal   Collection Time: 08/13/23  8:21 AM  Result Value Ref Range   Glucose-Capillary 122 (H) 70 - 99 mg/dL    Comment: Glucose reference range applies only to samples taken after fasting for at least 8 hours.  CBG monitoring, ED     Status: Abnormal   Collection Time: 08/13/23  2:04 PM  Result Value Ref Range   Glucose-Capillary 116 (H) 70 - 99 mg/dL    Comment: Glucose reference range applies only to samples taken after fasting for at least 8 hours.   *Note: Due to a large number of results and/or encounters for the requested time period, some results have not been displayed. A complete set of results can be found in Results Review.   CT HEAD WO CONTRAST ( )  Result Date: 08/13/2023 CLINICAL DATA:  Acute on chronic weakness and confusion, stroke suspected EXAM: CT HEAD WITHOUT CONTRAST TECHNIQUE: Contiguous axial images were obtained from the base of the skull through the vertex without intravenous contrast. RADIATION DOSE REDUCTION: This exam was performed according to the departmental dose-optimization program which includes automated exposure control, adjustment of the mA and/or kV according to patient size and/or use of iterative reconstruction technique. COMPARISON:  08/08/2023  FINDINGS: Brain: No evidence of acute infarction, hemorrhage, mass, mass effect, or midline shift. No hydrocephalus or extra-axial fluid collection. Redemonstrated right parietal and occipital encephalomalacia, consistent with remote infarcts. Redemonstrated chronic lacunar infarct in right pons. Vascular: No hyperdense vessel. Atherosclerotic calcifications in the intracranial carotid and vertebral arteries. Skull: Negative for fracture or focal lesion. Sinuses/Orbits: Minimal mucosal thickening in the ethmoid air cells. No acute finding in the orbits. Other: The mastoid air cells are well aerated. IMPRESSION: No acute intracranial process. Electronically Signed   By: Wiliam Ke M.D.   On: 08/13/2023 01:36   DG Chest 2 View  Result Date: 08/12/2023 CLINICAL DATA:  Altered mental status.  Suspected sepsis. EXAM: CHEST - 2 VIEW COMPARISON:  07/30/2023 year.  CT 08/08/2023 FINDINGS: Prior CABG. Cardiomegaly. Low lung volumes. Bilateral lower lobe airspace opacities are noted. No visible effusions. No acute bony abnormality. IMPRESSION: Very low lung volumes with bibasilar atelectasis or infiltrates. Cardiomegaly. Electronically Signed   By: Charlett Nose M.D.   On: 08/12/2023 20:12    Pending Labs Unresulted Labs (From admission, onward)     Start     Ordered   08/14/23 0500  CBC with Differential/Platelet  Tomorrow morning,   R        08/13/23 1002   08/14/23 0500  Basic metabolic panel  Tomorrow morning,   R  08/13/23 1002   08/13/23 0730  MRSA Next Gen by PCR, Nasal  (MRSA Screening)  Once,   R        08/13/23 0729   08/13/23 0512  Legionella Pneumophila Serogp 1 Ur Ag  Once,   R        08/13/23 0512   08/13/23 0031  Blood gas, venous  Once,   R        08/13/23 0030   08/12/23 2320  Expectorated Sputum Assessment w Gram Stain, Rflx to Resp Cult  Once,   R       Question:  Patient immune status  Answer:  Normal   08/12/23 2320   08/12/23 2320  Legionella Pneumophila Serogp 1 Ur Ag   Once,   URGENT        08/12/23 2320   08/12/23 2320  Expectorated Sputum Assessment w Gram Stain, Rflx to Resp Cult  Once,   R       Question:  Patient immune status  Answer:  Normal   08/12/23 2320            Vitals/Pain Today's Vitals   08/13/23 1445 08/13/23 1500 08/13/23 1515 08/13/23 1530  BP: 107/69 96/69 105/62 108/63  Pulse: 65 63 62 65  Resp: (!) 8 (!) 7 (!) 7 (!) 8  Temp:      TempSrc:      SpO2: 95% 95% 96% 96%  Weight:      Height:      PainSc:        Isolation Precautions Airborne and Contact precautions  Medications Medications  insulin aspart (novoLOG) injection 0-9 Units ( Subcutaneous Not Given 08/13/23 1404)  aspirin chewable tablet 81 mg (81 mg Oral Given 08/13/23 1011)  clonazePAM (KLONOPIN) tablet 0.5 mg (0.5 mg Oral Given 08/13/23 1011)  clopidogrel (PLAVIX) tablet 75 mg (75 mg Oral Given 08/13/23 1011)  escitalopram (LEXAPRO) tablet 20 mg (20 mg Oral Given 08/13/23 1013)  fludrocortisone (FLORINEF) tablet 0.1 mg (0.1 mg Oral Given 08/13/23 1012)  pregabalin (LYRICA) capsule 225 mg (225 mg Oral Given 08/13/23 1011)  pantoprazole (PROTONIX) EC tablet 40 mg (40 mg Oral Given 08/13/23 1014)  topiramate (TOPAMAX) tablet 25 mg (has no administration in time range)  0.9 %  sodium chloride infusion (0 mLs Intravenous Stopped 08/13/23 1422)  acetaminophen (TYLENOL) tablet 650 mg (650 mg Oral Given 08/13/23 1403)    Or  acetaminophen (TYLENOL) suppository 650 mg ( Rectal See Alternative 08/13/23 1403)  ondansetron (ZOFRAN) tablet 4 mg (has no administration in time range)    Or  ondansetron (ZOFRAN) injection 4 mg (has no administration in time range)  fentaNYL (SUBLIMAZE) injection 12.5-50 mcg (50 mcg Intravenous Given 08/13/23 1403)  albuterol (PROVENTIL) (2.5 MG/3ML) 0.083% nebulizer solution 2.5 mg (has no administration in time range)  guaiFENesin (MUCINEX) 12 hr tablet 600 mg (600 mg Oral Given 08/13/23 1013)  vancomycin (VANCOREADY) IVPB 750 mg/150 mL (0 mg  Intravenous Stopped 08/13/23 1533)  ceFEPIme (MAXIPIME) 2 g in sodium chloride 0.9 % 100 mL IVPB (0 g Intravenous Stopped 08/13/23 1437)  lamoTRIgine (LAMICTAL) tablet 225 mg (has no administration in time range)  amitriptyline (ELAVIL) tablet 10 mg (has no administration in time range)  oxyCODONE (Oxy IR/ROXICODONE) immediate release tablet 5-10 mg (10 mg Oral Given 08/13/23 1403)  naproxen (NAPROSYN) tablet 500 mg (has no administration in time range)  lidocaine (LIDODERM) 5 % 1 patch (has no administration in time range)  acetaminophen (TYLENOL) tablet 650 mg (650  mg Oral Given 08/12/23 1812)  vancomycin (VANCOREADY) IVPB 1750 mg/350 mL (0 mg Intravenous Stopped 08/13/23 0023)  ceFEPIme (MAXIPIME) 2 g in sodium chloride 0.9 % 100 mL IVPB (0 g Intravenous Stopped 08/12/23 2202)  lactated ringers bolus 2,667 mL (0 mLs Intravenous Stopped 08/13/23 0300)  fentaNYL (SUBLIMAZE) injection 50 mcg (50 mcg Intravenous Given 08/12/23 2353)  oxyCODONE (Oxy IR/ROXICODONE) immediate release tablet 5 mg (5 mg Oral Given 08/12/23 2352)    Mobility walks with device     Focused Assessments Pulmonary Assessment Handoff:  Lung sounds: Bilateral Breath Sounds: Diminished L Breath Sounds: Diminished R Breath Sounds: Fine crackles O2 Device: Nasal Cannula O2 Flow Rate (L/min): 4 L/min    R Recommendations: See Admitting Provider Note  Report given to:   Additional Notes: she stands and pivots with assist going home on hospice is the plan

## 2023-08-13 NOTE — Assessment & Plan Note (Signed)
Cover w cefepime for now  Await result of urine culture

## 2023-08-13 NOTE — Assessment & Plan Note (Signed)
Chronic stable patient is overall have very poor prognosis palliative care consult

## 2023-08-13 NOTE — Assessment & Plan Note (Signed)
Continue cefepime and Vanco for now.  Patient with worsening respiratory failure

## 2023-08-13 NOTE — Telephone Encounter (Signed)
Husband canceled echocardiogram test and stated patient has been put in hospice care.

## 2023-08-13 NOTE — Progress Notes (Signed)
[facility] Liaison RN note  Received request from PMT for hospice services at home after discharge. Chart and patient information under review by Hospice physician.   Spoke with patient and spouse to initiate education related to hospice philosophy, services, and team approach to care. Patient/family verbalized understanding of information given.   Per discussion, the plan is for discharge home today or tomorrow.  DME needs discussed. Patient has no immediate DME needs. This will be further assessed by 1st nurse visiting. Address has been verified and is correct in the chart.    Please send signed and completed DNR with patient/family. Please provide prescriptions at discharge as needed for ongoing symptom management.   [facility] information and contact numbers given to [place], husband.    Please call with any hospice related questions or concerns.  Thank you for the opportunity to participate in this patient's care.   Thea Gist, Charity fundraiser, BSN [employer] [phone]

## 2023-08-13 NOTE — Progress Notes (Signed)
Pharmacy Antibiotic Note  Erika [name] is a 52 y.o. female admitted on 08/12/2023 with pneumonia.  Pharmacy has been consulted for vancomycin and cefepime dosing.  Plan: Vancomycin 1750mg  given in ED; continue with vanc 750mg  IV Q12H. Goal AUC 400-550.  Expected AUC 490. Cefepime 2g IV Q8H.  Height: 5\' 7"  (170.2 cm) Weight: 88.9 kg (195 lb 15.8 oz) IBW/kg (Calculated) : 61.6  Temp (24hrs), Avg:99.7 F (37.6 C), Min:98.3 F (36.8 C), Max:102.8 F (39.3 C)  Recent Labs  Lab 08/08/23 1520 08/12/23 1759 08/12/23 1812  WBC 7.5 18.6*  --   CREATININE 0.74 0.85  --   LATICACIDVEN  --   --  1.2    Estimated Creatinine Clearance: 91.6 mL/min (by C-G formula based on SCr of 0.85 mg/dL).    No Known Allergies   Thank you for allowing pharmacy to be a part of this patient's care.  Vernard Gambles, PharmD, BCPS  08/13/2023 12:58 AM

## 2023-08-13 NOTE — Progress Notes (Signed)
Orthopedic Tech Progress Note Patient Details:  [name] 21-Jul-1971 [id number]    Patient has on SHOULDER SLING at this moment. Husband stated he would just keep what patient currently has on which is a shoulder sling but it is a little small.  Patient ID: Erika [name], female   DOB: [birth year], 52 y.o.   MRN: [id number]  Donald Pore 08/13/2023, 11:42 AM

## 2023-08-13 NOTE — Assessment & Plan Note (Signed)
Chronic stable obtain anemia panel in a.m.

## 2023-08-13 NOTE — Assessment & Plan Note (Signed)
Patient oxygen demand has been up now to 6 L. Evidence of persistent infiltrates Resume antibiotic coverage Provide oxygen as needed

## 2023-08-13 NOTE — Assessment & Plan Note (Signed)
Acute on chronic in the setting of polysubstance.  Possibly chronic worsening infection and hypoxia. Discussed with patient comfort versus continuation of her home medications will hold off on some of sedation And try to avoid long-lasting narcotics if possible Appreciate palliative care consult to discuss overall goals of therapy and care

## 2023-08-13 NOTE — Assessment & Plan Note (Signed)
Resume aspirin and Plavix.

## 2023-08-13 NOTE — Assessment & Plan Note (Signed)
Order sliding scale  

## 2023-08-13 NOTE — Discharge Planning (Signed)
RNCM met with pt and husband Higher education careers adviser) at bedside regarding home hospice.  [name] chose [facility] to render services.  Misty with [facility] notified and accepted referral.

## 2023-08-13 NOTE — Assessment & Plan Note (Signed)
Resume BiPAP

## 2023-08-13 NOTE — ED Notes (Signed)
Up to BSC.

## 2023-08-14 ENCOUNTER — Other Ambulatory Visit: Payer: Self-pay | Admitting: Physical Medicine and Rehabilitation

## 2023-08-14 ENCOUNTER — Ambulatory Visit: Payer: BC Managed Care – PPO | Admitting: Registered Nurse

## 2023-08-14 DIAGNOSIS — J189 Pneumonia, unspecified organism: Secondary | ICD-10-CM | POA: Diagnosis not present

## 2023-08-14 DIAGNOSIS — Z515 Encounter for palliative care: Secondary | ICD-10-CM | POA: Diagnosis not present

## 2023-08-14 DIAGNOSIS — Z7189 Other specified counseling: Secondary | ICD-10-CM | POA: Diagnosis not present

## 2023-08-14 LAB — CBC WITH DIFFERENTIAL/PLATELET
Abs Immature Granulocytes: 0.04 10*3/uL (ref 0.00–0.07)
Basophils Absolute: 0 10*3/uL (ref 0.0–0.1)
Basophils Relative: 1 %
Eosinophils Absolute: 0.2 10*3/uL (ref 0.0–0.5)
Eosinophils Relative: 3 %
HCT: 26 % — ABNORMAL LOW (ref 36.0–46.0)
Hemoglobin: 7.5 g/dL — ABNORMAL LOW (ref 12.0–15.0)
Immature Granulocytes: 1 %
Lymphocytes Relative: 23 %
Lymphs Abs: 1.6 10*3/uL (ref 0.7–4.0)
MCH: 22.5 pg — ABNORMAL LOW (ref 26.0–34.0)
MCHC: 28.8 g/dL — ABNORMAL LOW (ref 30.0–36.0)
MCV: 77.8 fL — ABNORMAL LOW (ref 80.0–100.0)
Monocytes Absolute: 0.5 10*3/uL (ref 0.1–1.0)
Monocytes Relative: 7 %
Neutro Abs: 4.4 10*3/uL (ref 1.7–7.7)
Neutrophils Relative %: 65 %
Platelets: 435 10*3/uL — ABNORMAL HIGH (ref 150–400)
RBC: 3.34 MIL/uL — ABNORMAL LOW (ref 3.87–5.11)
RDW: 23.1 % — ABNORMAL HIGH (ref 11.5–15.5)
WBC: 6.7 10*3/uL (ref 4.0–10.5)
nRBC: 0 % (ref 0.0–0.2)

## 2023-08-14 LAB — GLUCOSE, CAPILLARY
Glucose-Capillary: 95 mg/dL (ref 70–99)
Glucose-Capillary: 97 mg/dL (ref 70–99)

## 2023-08-14 LAB — BASIC METABOLIC PANEL
Anion gap: 10 (ref 5–15)
BUN: 8 mg/dL (ref 6–20)
CO2: 26 mmol/L (ref 22–32)
Calcium: 9.2 mg/dL (ref 8.9–10.3)
Chloride: 101 mmol/L (ref 98–111)
Creatinine, Ser: 0.68 mg/dL (ref 0.44–1.00)
GFR, Estimated: >60 mL/min (ref >60)
Glucose, Bld: 90 mg/dL (ref 70–99)
Potassium: 3.5 mmol/L (ref 3.5–5.1)
Sodium: 137 mmol/L (ref 135–145)

## 2023-08-14 LAB — LEGIONELLA PNEUMOPHILA SEROGP 1 UR AG: L. pneumophila Serogp 1 Ur Ag: NEGATIVE

## 2023-08-14 MED ORDER — FENTANYL CITRATE PF 50 MCG/ML IJ SOSY: 25.0000 ug | PREFILLED_SYRINGE | INTRAMUSCULAR | Status: DC | PRN

## 2023-08-14 MED ORDER — CEFDINIR 300 MG PO CAPS: 300.0000 mg | ORAL_CAPSULE | Freq: Two times a day (BID) | ORAL | 0 refills | Status: AC

## 2023-08-14 MED ORDER — FENTANYL 12 MCG/HR TD PT72
1.0000 | MEDICATED_PATCH | TRANSDERMAL | Status: DC
  Administered 2023-08-14: 1 via TRANSDERMAL
  Filled 2023-08-14: qty 1

## 2023-08-14 NOTE — TOC Transition Note (Signed)
Transition of Care [facility]) - CM/SW Discharge Note   Patient Details  Name: DARYNA CISLER MRN: [id number] Date of Birth: [birth year]  Transition of Care [facility]) CM/SW Contact:  Harriet Masson, RN Phone Number: 08/14/2023, 11:01 AM   Clinical Narrative:    Patient stable for discharge.  [facility] home hospice apt at 1300 today. Family to transport home.   Final next level of care: Home w Hospice Care Barriers to Discharge: Barriers Resolved   Patient Goals and CMS Choice    Return home  Discharge Placement    home                     Discharge Plan and Services Additional resources added to the After Visit Summary for                                       Social Determinants of Health (SDOH) Interventions SDOH Screenings   Food Insecurity: No Food Insecurity (08/13/2023)  Housing: Patient Unable To Answer (08/13/2023)  Transportation Needs: No Transportation Needs (08/13/2023)  Utilities: Not At Risk (08/13/2023)  Recent Concern: Utilities - At Risk (06/13/2023)  Depression (PHQ2-9): Medium Risk (08/12/2023)  Tobacco Use: Medium Risk (08/12/2023)     Readmission Risk Interventions    07/31/2023    1:50 PM 07/17/2023    2:19 PM 12/24/2021   12:23 PM  Readmission Risk Prevention Plan  Transportation Screening Complete Complete Complete  PCP or Specialist Appt within 3-5 Days  Complete   HRI or Home Care Consult  Complete   Social Work Consult for Recovery Care Planning/Counseling  Complete   Palliative Care Screening  Complete   Medication Review Oceanographer) Referral to Pharmacy Referral to Pharmacy Complete  PCP or Specialist appointment within 3-5 days of discharge Complete  Complete  HRI or Home Care Consult Complete  Complete  SW Recovery Care/Counseling Consult Complete    Palliative Care Screening Not Applicable    Skilled Nursing Facility Not Applicable

## 2023-08-14 NOTE — Progress Notes (Signed)
Mobility Specialist Progress Note:   08/14/23 0900  Mobility  Activity Ambulated with assistance in hallway  Level of Assistance Minimal assist, patient does 75% or more  Assistive Device Other (Comment) (HHA)  Distance Ambulated (ft) 20 ft (10+10)  LUE Weight Bearing NWB  LLE Weight Bearing WBAT  Activity Response Tolerated fair  Mobility Referral Yes  $Mobility charge 1 Mobility  Mobility Specialist Start Time (ACUTE ONLY) 0919  Mobility Specialist Stop Time (ACUTE ONLY) 0940  Mobility Specialist Time Calculation (min) (ACUTE ONLY) 21 min    Pre Mobility: 73 HR, 96% SpO2 RA During Mobility: 111 HR,  88% SpO2 RA Post Mobility:  102 HR,  92% SpO2 RA  Pt received in bed, agreeable to mobility. Experienced balance deficits, requiring MinA to combat. SpO2 levels dropping to 88% during ambulation, rising to 90% after pursed lipped breathing. C/o dizziness, limiting distance. Pt left on EOB with call bell and RN present.  D'Vante Earlene Plater Mobility Specialist Please contact via Special educational needs teacher or Rehab office at [phone]

## 2023-08-14 NOTE — Progress Notes (Addendum)
   Palliative Medicine Inpatient Follow Up Note   HPI: Erika [name] is a 52 y.o. 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   Today's Discussion 08/14/2023  *Please note that this is a verbal dictation therefore any spelling or grammatical errors are due to the "Dragon Medical One" system interpretation.  Chart reviewed inclusive of vital signs, progress notes, laboratory results, and diagnostic images.   I met with Erika [name] and her husband, Susy Frizzle this morning. Cesar feels that her pain has been under much better control. We discussed a fentanyl patch initiation with titration per hospice. She feels confident about discharging this afternoon with hospice support.  Created space and opportunity for patient to explore thoughts feelings and fears regarding current medical situation. She is very much aware of her chronic disease processes and that she has multiple incurable disease processes. She has herself elected for hospice given her downward trajectory and recurrent hospitalizations.   We discussed further hospice services and how all care transitions to the hospice team. Family is relieved by this.   Per discussion with hospice liaison a hospice RN can meet patient at 1PM to do their intake evaluation.   Questions and concerns addressed/Palliative Support Provided.   Objective Assessment: Vital Signs Vitals:   08/14/23 0655 08/14/23 0807  BP:  111/61  Pulse:  94  Resp:  18  Temp:  97.7 F (36.5 C)  SpO2: 93% 92%   No intake  or output data in the 24 hours ending 08/14/23 0925 Last Weight  Most recent update: 08/12/2023  5:14 PM    Weight  88.9 kg (195 lb 15.8 oz)            Gen: Middle-age Caucasian female in no acute distress HEENT: moist mucous membranes CV: Regular rate and rhythm  PULM: On 4L nasal cannula breathing is even and nonlabored ABD: soft/nontender  EXT: No edema  Neuro: Alert and oriented x3   SUMMARY OF RECOMMENDATIONS   DNAR/DNI   Chronic disease trajectory explained and reviewed   Patient and her husband are interested in meeting with the hospice liaison   Ongoing palliative care support  Symptom Management:  Left shoulder pain: -Tylenol as needed -Oxycodone 5-10mg  every 4 hours as needed -Fentanyl patch  Time Spent: 60 Billing based on MDM: High _____________________________________________________________________________________ Lamarr Lulas [place] Palliative Medicine Team Team Cell Phone: [phone] Please utilize secure chat with additional questions, if there is no response within 30 minutes please call the above phone number  Palliative Medicine Team providers are available by phone from 7am to 7pm daily and can be reached through the team cell phone.  Should this patient require assistance outside of these hours, please call the patient's attending physician.

## 2023-08-14 NOTE — Progress Notes (Signed)
Nurse requested Mobility Specialist to perform oxygen saturation test with pt which includes removing pt from oxygen both at rest and while ambulating.  Below are the results from that testing.     Patient Saturations on Room Air at Rest = spO2 96%. HR = 73.  Patient Saturations on Room Air while Ambulating = sp02 88%. HR = 111.  Rested and performed pursed lip breathing for 1 minute with sp02 at 90%.   At end of testing pt left in room on 4  Liters of oxygen.  Reported results to nurse.   Erika [name] Mobility Specialist Please contact via Special educational needs teacher or Rehab office at [phone]

## 2023-08-14 NOTE — Plan of Care (Signed)

## 2023-08-14 NOTE — Progress Notes (Signed)
   08/14/23 0031  BiPAP/CPAP/SIPAP  Reason BIPAP/CPAP not in use Non-compliant

## 2023-08-14 NOTE — Discharge Summary (Signed)
Physician Discharge Summary  Erika [name] UUV:[id number] DOB: [birth year] DOA: 08/12/2023  PCP: Eden Emms, NP  Admit date: 08/12/2023 Discharge date: 08/14/2023 30 Day Unplanned Readmission Risk Score    Flowsheet Row ED to Hosp-Admission (Current) from 08/12/2023 in [facility]  30 Day Unplanned Readmission Risk Score (%) 37.99 Filed at 08/14/2023 0801       This score is the patient's risk of an unplanned readmission within 30 days of being discharged (0 -100%). The score is based on dignosis, age, lab data, medications, orders, and past utilization.   Low:  0-14.9   Medium: 15-21.9   High: 22-29.9   Extreme: 30 and above          Admitted From: Home Disposition: Home with hospice  Recommendations for Outpatient Follow-up:  Follow up with PCP in 1-2 weeks Please obtain BMP/CBC in one week Follow-up with orthopedics in 2 to 4 weeks. Please follow up with your PCP on the following pending results: Unresulted Labs (From admission, onward)     Start     Ordered   08/13/23 0512  Legionella Pneumophila Serogp 1 Ur Ag  Once,   R        08/13/23 0512   08/13/23 0031  Blood gas, venous  Once,   R        08/13/23 0030   08/12/23 2320  Expectorated Sputum Assessment w Gram Stain, Rflx to Resp Cult  Once,   R       Question:  Patient immune status  Answer:  Normal   08/12/23 2320   08/12/23 2320  Legionella Pneumophila Serogp 1 Ur Ag  Once,   URGENT        08/12/23 2320   08/12/23 2320  Expectorated Sputum Assessment w Gram Stain, Rflx to Resp Cult  Once,   R       Question:  Patient immune status  Answer:  Normal   08/12/23 2320              Home Health: None Equipment/Devices: Home oxygen which she already has  Discharge Condition: Fair CODE STATUS: Full code Diet recommendation: Cardiac  Subjective: Seen and examined.  Husband at bedside.  Patient says that she is feeling a lot better.  She has intermittent exertional shortness of  breath but she is back on 4 L of oxygen which she uses at home constantly.  She thinks that she feels a lot better and would feel comfortable at home and is requesting a discharge.  Brief/Interim Summary: Erika [name] is a 52 y.o. female with medical history significant of left hemiplegia and hemiparesis affecting the diaphragm multiple cerebral infarcts chronic respiratory failure at baseline on 4 L, hypertension hyperlipidemia, history of CABG and and enterectomy, obesity sleep apnea, PAD PNA PTSD diabetes type 2, presented with confusion, fatigue and shortness of breath and was diagnosed with acute on chronic hypoxic respiratory failure secondary to hospital-acquired pneumonia as patient was recently admitted at [facility] from 07/30/2023 through 08/02/2023 for the similar issue as well as septic shock.  Patient was noted to be requiring 6 L of oxygen upon admission.  Details below.   Acute on chronic hypoxic respiratory failure as well as sepsis secondary to healthcare associated pneumonia, POA: Patient met sepsis criteria based on fever of 102.8 and tachycardia and obvious source of persistent pneumonia.  Was on 6 L of oxygen yesterday but currently on 4 L and she is fully alert and oriented and  comfortable and denies any shortness of breath.  Does have rhonchi bilaterally but sepsis parameters have resolved now.  She received 2 days of cefepime and vancomycin.  Now she feels much better and comfortable and she is requesting discharge.  Due to lack of the ideal oral antibiotic for healthcare associated pneumonia, she is being discharged on cefdinir for 5 more days.   History of multiple strokes with left hemiplegia: Chronic and stable.  Aspirin and Plavix.   Acute comminuted and mildly displaced fracture of the left humeral neck: This happened as outpatient on 08/08/2023 after a fall.  She saw her PCP who sent her for the x-rays which showed fracture.  Patient lives in [place] and orthopedic group over  there is not in her insurance network.  She requested orthopedic consult.  She was seen by them as well, they recommended nonoperative management with sling and nonweightbearing and she will follow-up with Dr. Aundria Rud upon discharge.  She is aware of the plan.   Type 2 diabetes mellitus: Resume home meds.   Chronic diastolic congestive heart failure: Appears stable.     Acute metabolic encephalopathy/delirium/polypharmacy: Source unknown, could be hypoxia or polypharmacy.  CT head negative.  Patient fully alert and oriented at the moment.   OSA: Continue BiPAP as needed.   History of CAD: Resume aspirin and Plavix.   Anemia of chronic disease: Patient appears to have baseline hemoglobin around 9.  Currently 7. 5.  No indication of transfusion.   GOC: Patient was recently seen by palliative care at [facility].  She was seen by them here again and she expressed her interest in hospice.  She has accepted hospice at home and they are going to meet with her at 1 PM today at home.    Discharge plan was discussed with patient and/or family member and they verbalized understanding and agreed with it.  Discharge Diagnoses:  Principal Problem:   HCAP (healthcare-associated pneumonia) Active Problems:   Acute on chronic respiratory failure with hypoxia (HCC)   Anemia   Chronic diastolic CHF (congestive heart failure) (HCC)   CAD (coronary artery disease)   Hemiplegia and hemiparesis following cerebral infarction affecting left non-dominant side (HCC)   Type 2 diabetes mellitus with other specified complication (HCC)   Acute metabolic encephalopathy/delirium/polypharmacy   OSA (obstructive sleep apnea)   UTI (urinary tract infection)   Palliative care by specialist   Goals of care, counseling/discussion    Discharge Instructions   Allergies as of 08/14/2023   No Known Allergies      Medication List     TAKE these medications    acetaminophen 325 MG tablet Commonly known as:  TYLENOL Take 1-2 tablets (325-650 mg total) by mouth every 4 (four) hours as needed for mild pain.   albuterol (2.5 MG/3ML) 0.083% nebulizer solution Commonly known as: PROVENTIL Take 3 mLs (2.5 mg total) by nebulization every 4 (four) hours as needed for wheezing or shortness of breath.   amitriptyline 10 MG tablet Commonly known as: ELAVIL Take 1 tablet (10 mg total) by mouth at bedtime.   aspirin 81 MG chewable tablet Chew 1 tablet (81 mg total) by mouth daily.   cefdinir 300 MG capsule Commonly known as: OMNICEF Take 1 capsule (300 mg total) by mouth 2 (two) times daily for 5 days.   chlorproMAZINE 25 MG tablet Commonly known as: THORAZINE Take 3 tablets (75 mg total) by mouth at bedtime.   clonazePAM 0.5 MG tablet Commonly known as: KlonoPIN Take 0.5 tablets (  0.25 mg total) by mouth 2 (two) times daily. Take 1/2 tab twice daily. No dissolving tablets.   clopidogrel 75 MG tablet Commonly known as: PLAVIX Take 1 tablet (75 mg total) by mouth daily.   cyclobenzaprine 10 MG tablet Commonly known as: FLEXERIL TAKE ONE TABLET BY MOUTH AT BEDTIME   Dexcom G7 Sensor Misc APPLY ONE SENSOR TO THE BACK OF YOUR UPPER ARM. REPLACE EVERY 10 DAYS.   escitalopram 20 MG tablet Commonly known as: Lexapro Take 1 tablet (20 mg total) by mouth daily.   feeding supplement (NEPRO CARB STEADY) Liqd Take 237 mLs by mouth 3 (three) times daily between meals.   ferrous sulfate 324 (65 Fe) MG Tbec Take 1 tablet (324 mg total) by mouth 2 (two) times daily.   fludrocortisone 0.1 MG tablet Commonly known as: FLORINEF Take 1 tablet (0.1 mg total) by mouth daily.   icosapent Ethyl 1 g capsule Commonly known as: VASCEPA Take 2 g by mouth 2 (two) times daily.   lamoTRIgine 25 MG tablet Commonly known as: LaMICtal Take 1 tablet (25 mg total) by mouth daily. What changed: when to take this   lamoTRIgine 200 MG tablet Commonly known as: LAMICTAL Take 1 tablet (200 mg total) by mouth at  bedtime. What changed: Another medication with the same name was changed. Make sure you understand how and when to take each.   metoprolol succinate 25 MG 24 hr tablet Commonly known as: TOPROL-XL Take 1 tablet (25 mg total) by mouth daily.   Mounjaro 7.5 MG/0.5ML Pen Generic drug: tirzepatide INJECT THE CONTENTS OF ONE PEN UNDER THE SKIN WEEKLY ON THE SAME DAY EACH WEEK   nitroGLYCERIN 0.4 MG SL tablet Commonly known as: NITROSTAT Place 1 tablet (0.4 mg total) under the tongue every 5 (five) minutes as needed for chest pain.   oxyCODONE 15 MG immediate release tablet Commonly known as: ROXICODONE Take 1 tablet (15 mg total) by mouth 2 (two) times daily as needed for pain.   pantoprazole 40 MG tablet Commonly known as: PROTONIX TAKE ONE TABLET BY MOUTH ONE TIME DAILY What changed: when to take this   pregabalin 225 MG capsule Commonly known as: LYRICA Take 1 capsule (225 mg total) by mouth 2 (two) times daily.   Repatha SureClick 140 MG/ML Soaj Generic drug: Evolocumab Inject 140 mg into the skin as directed. Inject 1 Dose into the skin every 14 (fourteen) days.   Stiolto Respimat 2.5-2.5 MCG/ACT Aers Generic drug: Tiotropium Bromide-Olodaterol Inhale 2 puffs into the lungs daily.   topiramate 25 MG tablet Commonly known as: Topamax Take 1 tablet (25 mg total) by mouth at bedtime.   traMADol 50 MG tablet Commonly known as: ULTRAM Take 1 tablet (50 mg total) by mouth 2 (two) times daily as needed.   UltiCare Mini Pen Needles 31G X 6 MM Misc Generic drug: Insulin Pen Needle USE PEN NEEDLES TWO TIMES DAILY        Follow-up Information     Eden Emms, NP Follow up in 1 week(s).   Specialties: Nurse Practitioner, Family Medicine Contact information: [address] [place] [address] [phone]                No Known Allergies  Consultations: Orthopedics and palliative care   Procedures/Studies: CT HEAD WO CONTRAST ( )  Result  Date: 08/13/2023 CLINICAL DATA:  Acute on chronic weakness and confusion, stroke suspected EXAM: CT HEAD WITHOUT CONTRAST TECHNIQUE: Contiguous axial images were obtained from the  base of the skull through the vertex without intravenous contrast. RADIATION DOSE REDUCTION: This exam was performed according to the departmental dose-optimization program which includes automated exposure control, adjustment of the mA and/or kV according to patient size and/or use of iterative reconstruction technique. COMPARISON:  08/08/2023 FINDINGS: Brain: No evidence of acute infarction, hemorrhage, mass, mass effect, or midline shift. No hydrocephalus or extra-axial fluid collection. Redemonstrated right parietal and occipital encephalomalacia, consistent with remote infarcts. Redemonstrated chronic lacunar infarct in right pons. Vascular: No hyperdense vessel. Atherosclerotic calcifications in the intracranial carotid and vertebral arteries. Skull: Negative for fracture or focal lesion. Sinuses/Orbits: Minimal mucosal thickening in the ethmoid air cells. No acute finding in the orbits. Other: The mastoid air cells are well aerated. IMPRESSION: No acute intracranial process. Electronically Signed   By: Wiliam Ke M.D.   On: 08/13/2023 01:36   CT Chest Wo Contrast  Result Date: 08/12/2023 CLINICAL DATA:  Pneumonia, complication suspected, xray done FU pneumonia. Follow-up pneumonia. EXAM: CT CHEST WITHOUT CONTRAST TECHNIQUE: Multidetector CT imaging of the chest was performed following the standard protocol without IV contrast. RADIATION DOSE REDUCTION: This exam was performed according to the departmental dose-optimization program which includes automated exposure control, adjustment of the mA and/or kV according to patient size and/or use of iterative reconstruction technique. COMPARISON:  07/30/2023 FINDINGS: Cardiovascular: Prior CABG. Heart is normal size. Aorta is normal caliber. Aortic atherosclerosis.  Mediastinum/Nodes: No mediastinal, hilar, or axillary adenopathy. Trachea and esophagus are unremarkable. Thyroid unremarkable. Lungs/Pleura: Airspace opacities are again noted most pronounced in the lower lobes, but improving since prior study. Patchy airspace disease also noted in both upper lobes and right middle lobe, all improving since prior study. No effusions. Upper Abdomen: No acute findings Musculoskeletal: Chest wall soft tissues are unremarkable. No acute bony abnormality. IMPRESSION: Airspace opacities in the mid and lower lungs, most confluent in the lower lobes. Opacities have improved since prior study compatible with residual but improving pneumonia. Aortic Atherosclerosis (ICD10-I70.0). Electronically Signed   By: Charlett Nose M.D.   On: 08/12/2023 20:15   DG Chest 2 View  Result Date: 08/12/2023 CLINICAL DATA:  Altered mental status.  Suspected sepsis. EXAM: CHEST - 2 VIEW COMPARISON:  07/30/2023 year.  CT 08/08/2023 FINDINGS: Prior CABG. Cardiomegaly. Low lung volumes. Bilateral lower lobe airspace opacities are noted. No visible effusions. No acute bony abnormality. IMPRESSION: Very low lung volumes with bibasilar atelectasis or infiltrates. Cardiomegaly. Electronically Signed   By: Charlett Nose M.D.   On: 08/12/2023 20:12   DG Humerus Left  Result Date: 08/08/2023 CLINICAL DATA:  Fall, pain with palpation EXAM: LEFT HUMERUS - 2+ VIEW COMPARISON:  07/16/2023 FINDINGS: Acute comminuted and mildly displaced fracture involving the left humeral neck and proximal shaft. Mild inferior displacement of the humeral head without frank dislocation allowing for limits of positioning. IMPRESSION: Acute comminuted and mildly displaced fracture involving the left humeral neck and proximal shaft. Electronically Signed   By: Jasmine Pang M.D.   On: 08/08/2023 18:16   CT HEAD WO CONTRAST ( )  Result Date: 08/08/2023 CLINICAL DATA:  Head trauma on Plavix and aspirin increased confusion with recent  fall EXAM: CT HEAD WITHOUT CONTRAST TECHNIQUE: Contiguous axial images were obtained from the base of the skull through the vertex without intravenous contrast. RADIATION DOSE REDUCTION: This exam was performed according to the departmental dose-optimization program which includes automated exposure control, adjustment of the mA and/or kV according to patient size and/or use of iterative reconstruction technique. COMPARISON:  CT brain  07/30/2023 FINDINGS: Brain: No acute territorial infarction, hemorrhage or intracranial mass. Right parietal and occipital encephalomalacia consistent with chronic infarcts. Chronic lacunar infarct in the right pons. The ventricles are nonenlarged. Vascular: No hyperdense vessels.  Carotid vascular calcification Skull: Normal. Negative for fracture or focal lesion. Sinuses/Orbits: No acute finding. Other: None IMPRESSION: 1. No CT evidence for acute intracranial abnormality. 2. Chronic right parietal and occipital infarcts. Chronic right pontine lacunar infarct. Electronically Signed   By: Jasmine Pang M.D.   On: 08/08/2023 18:14   CT Angio Chest Pulmonary Embolism (PE) W or WO Contrast  Result Date: 07/30/2023 CLINICAL DATA:  52 year old female with altered mental status, abnormal breathing, undergoing treatment for pneumonia. History of previous cerebral infarct with left side deficit. EXAM: CT ANGIOGRAPHY CHEST WITH CONTRAST TECHNIQUE: Multidetector CT imaging of the chest was performed using the standard protocol during bolus administration of intravenous contrast. Multiplanar CT image reconstructions and MIPs were obtained to evaluate the vascular anatomy. RADIATION DOSE REDUCTION: This exam was performed according to the departmental dose-optimization program which includes automated exposure control, adjustment of the mA and/or kV according to patient size and/or use of iterative reconstruction technique. CONTRAST:  75mL OMNIPAQUE IOHEXOL 350 MG/ML SOLN COMPARISON:  Portable  chest 0918 hours today.  Prior CTA 07/16/2023. Speech pathology evaluation 07/05/2023. FINDINGS: Cardiovascular: Good contrast bolus timing in the pulmonary arterial tree. No pulmonary artery filling defect. Prior CABG. Calcified aortic atherosclerosis. Cardiac size remains within normal limits. No pericardial effusion. Mediastinum/Nodes: Postoperative changes. Reactive appearing mediastinal lymph nodes including right paratracheal, similar to last month. No mediastinal mass or suspicious lymphadenopathy. Lungs/Pleura: Major airways are patent with some atelectatic changes. But there is bilateral lower lobe consolidation with air bronchograms, similar to but progressed since 07/16/2023. No pleural effusions. And there is similar dependent peribronchial opacity now in the middle lobes, additional patchy and nodular middle lobe bronchovascular involvement, and early mostly dependent peribronchial involvement also in the upper lobes. Upper Abdomen: Negative visible liver, spleen, pancreas, adrenal glands and bowel in the upper abdomen. Musculoskeletal: Sternotomy. No acute osseous abnormality identified. Review of the MIP images confirms the above findings. IMPRESSION: 1. Negative for acute pulmonary embolus. 2. Multi lobar and dependent bilateral pneumonia, progressed since 07/16/2023 CTA. Consider sequelae of aspiration in this clinical setting. 3.  Aortic Atherosclerosis (ICD10-I70.0).  Prior CABG. Electronically Signed   By: Odessa Fleming M.D.   On: 07/30/2023 11:13   CT Head Wo Contrast  Result Date: 07/30/2023 CLINICAL DATA:  52 year old female with altered mental status, abnormal breathing, undergoing treatment for pneumonia. History of previous cerebral infarct with left side deficit. EXAM: CT HEAD WITHOUT CONTRAST TECHNIQUE: Contiguous axial images were obtained from the base of the skull through the vertex without intravenous contrast. RADIATION DOSE REDUCTION: This exam was performed according to the  departmental dose-optimization program which includes automated exposure control, adjustment of the mA and/or kV according to patient size and/or use of iterative reconstruction technique. COMPARISON:  Brain MRI 07/16/2023.  Head CT 06/11/2023. FINDINGS: Brain: Right parietal and occipital lobe cortical encephalomalacia, gliosis appears stable. And there is chronic right brainstem atrophy, confirmed on the MRI last month. No superimposed No midline shift, ventriculomegaly, mass effect, evidence of mass lesion, intracranial hemorrhage or evidence of cortically based acute infarction. Gray-white matter differentiation is within normal limits throughout the brain. Vascular: No suspicious intracranial vascular hyperdensity. Calcified atherosclerosis at the skull base. Skull: Intact.  No acute osseous abnormality identified. Sinuses/Orbits: Visualized paranasal sinuses and mastoids are stable and well  aerated. Other: Visualized orbits and scalp soft tissues are within normal limits. IMPRESSION: 1. No acute intracranial abnormality. 2. Chronic cortical encephalomalacia in the right parietal and occipital lobes, and moderate brainstem atrophy and/or Wallerian degeneration - greater on the right. Electronically Signed   By: Odessa Fleming M.D.   On: 07/30/2023 10:59   DG Chest Port 1 View  Result Date: 07/30/2023 CLINICAL DATA:  Abnormal breathing EXAM: PORTABLE CHEST 1 VIEW COMPARISON:  CXR 07/16/23 FINDINGS: Status post median sternotomy and CABG. Unchanged cardiac and mediastinal contours. No large pleural effusion. No pneumothorax. There are prominent bilateral interstitial opacities there are somewhat basal predominant. Findings could represent pulmonary edema or multifocal infection. No radiographically apparent displaced rib fractures. Visualized upper abdomen is unremarkable. IMPRESSION: Prominent bilateral interstitial opacities, which are somewhat basal predominant. Findings could represent pulmonary edema or  multifocal infection. Electronically Signed   By: Lorenza Cambridge M.D.   On: 07/30/2023 09:29   ECHOCARDIOGRAM COMPLETE BUBBLE STUDY  Result Date: 07/17/2023    ECHOCARDIOGRAM REPORT   Patient Name:   NANCEY PADDEN Date of Exam: 07/17/2023 Medical Rec #:  [id number]           Height:       66.0 in Accession #:    [id number]          Weight:       181.0 lb Date of Birth:  Apr 23, 1971          BSA:          1.917 m Patient Age:    51 years            BP:           94/54 mmHg Patient Gender: F                   HR:           93 bpm. Exam Location:  [facility] Procedure: 2D Echo, Cardiac Doppler, Color Doppler, Saline Contrast Bubble Study            and Intracardiac Opacification Agent Indications:     Syncope  History:         Patient has prior history of Echocardiogram examinations, most                  recent 01/27/2022. Cardiomegaly and CHF, Previous Myocardial                  Infarction and CAD, Prior CABG, Stroke and COPD,                  Arrythmias:Bradycardia, Signs/Symptoms:Syncope, Chest Pain and                  Dyspnea; Risk Factors:Hypertension, Sleep Apnea, Diabetes,                  Current Smoker and Dyslipidemia.  Sonographer:     Mikki Harbor Referring Phys:  [id number] EKTA V PATEL Diagnosing Phys: Julien Nordmann MD  Sonographer Comments: Technically difficult study due to poor echo windows, suboptimal apical window and suboptimal subcostal window. IMPRESSIONS  1. Left ventricular ejection fraction, by estimation, is 60 to 65%. The left ventricle has normal function. The left ventricle has no regional wall motion abnormalities. There is mild left ventricular hypertrophy. Left ventricular diastolic parameters are consistent with Grade I diastolic dysfunction (impaired relaxation).  2. Right ventricular systolic function is normal. The right ventricular size is normal. Tricuspid regurgitation signal is  inadequate for assessing PA pressure.  3. The mitral valve is normal in structure. No evidence of  mitral valve regurgitation. No evidence of mitral stenosis.  4. The aortic valve has an indeterminant number of cusps. Aortic valve regurgitation is not visualized. Aortic valve sclerosis is present, with no evidence of aortic valve stenosis.  5. The inferior vena cava is normal in size with greater than 50% respiratory variability, suggesting right atrial pressure of 3 mmHg.  6. Agitated saline contrast bubble study was negative, with no evidence of any interatrial shunt. FINDINGS  Left Ventricle: Left ventricular ejection fraction, by estimation, is 60 to 65%. The left ventricle has normal function. The left ventricle has no regional wall motion abnormalities. Definity contrast agent was given IV to delineate the left ventricular  endocardial borders. The left ventricular internal cavity size was normal in size. There is mild left ventricular hypertrophy. Left ventricular diastolic parameters are consistent with Grade I diastolic dysfunction (impaired relaxation). Right Ventricle: The right ventricular size is normal. No increase in right ventricular wall thickness. Right ventricular systolic function is normal. Tricuspid regurgitation signal is inadequate for assessing PA pressure. Left Atrium: Left atrial size was normal in size. Right Atrium: Right atrial size was normal in size. Pericardium: There is no evidence of pericardial effusion. Mitral Valve: The mitral valve is normal in structure. Mild mitral annular calcification. No evidence of mitral valve regurgitation. No evidence of mitral valve stenosis. MV peak gradient, 6.6 mmHg. The mean mitral valve gradient is 3.0 mmHg. Tricuspid Valve: The tricuspid valve is normal in structure. Tricuspid valve regurgitation is not demonstrated. No evidence of tricuspid stenosis. Aortic Valve: The aortic valve has an indeterminant number of cusps. Aortic valve regurgitation is not visualized. Aortic valve sclerosis is present, with no evidence of aortic valve stenosis.  Aortic valve mean gradient measures 5.0 mmHg. Aortic valve peak gradient measures 8.1 mmHg. Aortic valve area, by VTI measures 2.19 cm. Pulmonic Valve: The pulmonic valve was normal in structure. Pulmonic valve regurgitation is not visualized. No evidence of pulmonic stenosis. Aorta: The aortic root is normal in size and structure. Venous: The inferior vena cava is normal in size with greater than 50% respiratory variability, suggesting right atrial pressure of 3 mmHg. IAS/Shunts: No atrial level shunt detected by color flow Doppler. Agitated saline contrast was given intravenously to evaluate for intracardiac shunting. Agitated saline contrast bubble study was negative, with no evidence of any interatrial shunt. There  is no evidence of a patent foramen ovale. There is no evidence of an atrial septal defect.  LEFT VENTRICLE PLAX 2D LVIDd:         4.30 cm   Diastology LVIDs:         3.00 cm   LV e' medial:    6.74 cm/s LV PW:         1.20 cm   LV E/e' medial:  14.7 LV IVS:        1.20 cm   LV e' lateral:   8.32 cm/s LVOT diam:     2.00 cm   LV E/e' lateral: 11.9 LV SV:         75 LV SV Index:   39 LVOT Area:     3.14 cm  RIGHT VENTRICLE RV Basal diam:  3.25 cm RV Mid diam:    3.60 cm RV S prime:     11.10 cm/s TAPSE (M-mode): 2.0 cm LEFT ATRIUM  Index        RIGHT ATRIUM           Index LA diam:        3.30 cm 1.72 cm/m   RA Area:     13.30 cm LA Vol (A2C):   37.1 ml 19.36 ml/m  RA Volume:   33.90 ml  17.69 ml/m LA Vol (A4C):   40.5 ml 21.13 ml/m LA Biplane Vol: 38.2 ml 19.93 ml/m  AORTIC VALVE                    PULMONIC VALVE AV Area (Vmax):    2.70 cm     PV Vmax:       1.09 m/s AV Area (Vmean):   2.62 cm     PV Peak grad:  4.8 mmHg AV Area (VTI):     2.19 cm AV Vmax:           142.00 cm/s AV Vmean:          99.700 cm/s AV VTI:            0.343 m AV Peak Grad:      8.1 mmHg AV Mean Grad:      5.0 mmHg LVOT Vmax:         122.00 cm/s LVOT Vmean:        83.300 cm/s LVOT VTI:          0.239 m  LVOT/AV VTI ratio: 0.70  AORTA Ao Root diam: 3.30 cm MITRAL VALVE MV Area (PHT): 4.17 cm     SHUNTS MV Area VTI:   2.95 cm     Systemic VTI:  0.24 m MV Peak grad:  6.6 mmHg     Systemic Diam: 2.00 cm MV Mean grad:  3.0 mmHg MV Vmax:       1.28 m/s MV Vmean:      73.3 cm/s MV Decel Time: 182 msec MV E velocity: 99.00 cm/s MV A velocity: 111.00 cm/s MV E/A ratio:  0.89 Julien Nordmann MD Electronically signed by Julien Nordmann MD Signature Date/Time: 07/17/2023/1:46:02 PM    Final    MR BRAIN WO CONTRAST  Result Date: 07/17/2023 CLINICAL DATA:  Initial evaluation for syncope/presyncope. EXAM: MRI HEAD WITHOUT CONTRAST TECHNIQUE: Multiplanar, multiecho pulse sequences of the brain and surrounding structures were obtained without intravenous contrast. COMPARISON:  Prior MRI from 06/03/2023. FINDINGS: Brain: Cerebral volume within normal limits. Patchy T2/FLAIR hyperintensity involving the periventricular and deep white matter, consistent with chronic small vessel ischemic disease, stable. Remote cortical infarcts involving the right parietal and occipital lobes. Remote right pontine infarct. FLAIR signal abnormality extending into the middle cerebellar peduncles could reflect gliosis and/or degeneration, stable. Additional small remote lacunar infarcts about the left basal ganglia and left thalamus, also unchanged. No abnormal foci of restricted diffusion to suggest acute or subacute ischemia. Gray-white matter distension otherwise maintained. No acute intracranial hemorrhage. Few punctate chronic micro hemorrhages noted, likely small vessel related, stable. No mass lesion, midline shift or mass effect. No hydrocephalus or extra-axial fluid collection. Pituitary gland suprasellar region within normal limits. Segment beyond the takeoff of the left PICA, which could be related to slow flow and/or occlusion, stable. Major intracranial vascular flow voids are otherwise maintained. Skull and upper cervical spine:  Craniocervical junction within normal limits. Diffusely decreased T1 signal intensity noted throughout the visualized bone marrow, nonspecific, but most commonly related to anemia, smoking or obesity. No visible focal marrow replacing lesion. No scalp soft tissue  abnormality. Sinuses/Orbits: Globes orbital soft tissues within normal limits. Paranasal sinuses are largely clear. Right-to-left nasal septal deviation with associated concha bullosa. No mastoid effusion. Other: None. IMPRESSION: 1. No acute intracranial abnormality. 2. Chronic right parieto-occipital and right pontine infarcts, with additional remote lacunar infarcts about the left basal ganglia and left thalamus, stable. 3. Underlying moderate chronic microvascular ischemic disease, stable. Electronically Signed   By: Rise Mu M.D.   On: 07/17/2023 00:29   DG Shoulder Left  Result Date: 07/16/2023 CLINICAL DATA:  Abnormal chest x-ray EXAM: LEFT SHOULDER - 2+ VIEW COMPARISON:  Chest x-ray earlier today FINDINGS: There is no evidence of fracture or dislocation. There is no evidence of arthropathy or other focal bone abnormality. Soft tissues are unremarkable. IMPRESSION: Negative. Electronically Signed   By: Charlett Nose M.D.   On: 07/16/2023 23:20   CT Angio Chest Pulmonary Embolism (PE) W or WO Contrast  Result Date: 07/16/2023 CLINICAL DATA:  Pulmonary embolism (PE) suspected, high prob EXAM: CT ANGIOGRAPHY CHEST WITH CONTRAST TECHNIQUE: Multidetector CT imaging of the chest was performed using the standard protocol during bolus administration of intravenous contrast. Multiplanar CT image reconstructions and MIPs were obtained to evaluate the vascular anatomy. RADIATION DOSE REDUCTION: This exam was performed according to the departmental dose-optimization program which includes automated exposure control, adjustment of the mA and/or kV according to patient size and/or use of iterative reconstruction technique. CONTRAST:  75mL  OMNIPAQUE IOHEXOL 350 MG/ML SOLN COMPARISON:  Chest x-ray today FINDINGS: Cardiovascular: No filling defects in the pulmonary arteries to suggest pulmonary emboli. Heart is normal size. Aorta is normal caliber. Prior CABG. Aortic atherosclerosis. Mediastinum/Nodes: No axillary adenopathy. Mildly prominent mediastinal and bilateral hilar lymph nodes, stable since prior study. Mildly distended esophagus with fluid. This is stable since prior study. Lungs/Pleura: Bilateral lower lobe airspace disease, slightly worsened since prior CT concerning for multifocal pneumonia. No effusions. Upper Abdomen: No acute findings Musculoskeletal: Chest wall soft tissues are unremarkable. No acute bony abnormality. Review of the MIP images confirms the above findings. IMPRESSION: No evidence of pulmonary embolus. Bilateral lower lobe airspace opacities, increased since prior CT compatible with worsening multifocal pneumonia. Stable prominent mediastinal and bilateral hilar lymph nodes, likely reactive. Stable patulous fluid-filled esophagus. Aortic Atherosclerosis (ICD10-I70.0). Electronically Signed   By: Charlett Nose M.D.   On: 07/16/2023 23:13   DG Chest 2 View  Result Date: 07/16/2023 CLINICAL DATA:  Cough, pneumonia EXAM: CHEST - 2 VIEW COMPARISON:  Previous studies including the examination of 07/03/2023 FINDINGS: There is poor inspiration. There are patchy infiltrates in both lower lung fields. There is interval worsening of infiltrates in left lower lung field. Costophrenic angles are clear. There is no pneumothorax. There is previous coronary bypass surgery. There is possible offset in alignment of left humeral head and glenoid. This finding is not fully evaluated. IMPRESSION: There are patchy infiltrates in both lower lung fields. There is interval worsening of infiltrates in left lower lung field. Findings suggest possible multifocal pneumonia with interval worsening in left lower lung field. There is possible offset  in alignment of left humeral head and glenoid. If clinically warranted, routine radiographs of left shoulder may be considered. Electronically Signed   By: Ernie Avena M.D.   On: 07/16/2023 15:37     Discharge Exam: Vitals:   08/14/23 0655 08/14/23 0807  BP:  111/61  Pulse:  94  Resp:  18  Temp:  97.7 F (36.5 C)  SpO2: 93% 92%   Vitals:   08/13/23  2300 08/14/23 0345 08/14/23 0655 08/14/23 0807  BP: 126/79 119/60  111/61  Pulse: 66 62  94  Resp: (!) 21 14  18   Temp: 97.6 F (36.4 C) 97.6 F (36.4 C)  97.7 F (36.5 C)  TempSrc: Oral Oral  Oral  SpO2: 96% 99% 93% 92%  Weight:      Height:        General: Pt is alert, awake, not in acute distress Cardiovascular: RRR, S1/S2 +, no rubs, no gallops Respiratory: Rhonchi bilaterally. Abdominal: Soft, NT, ND, bowel sounds + Extremities: no edema, no cyanosis, right hemiplegia.    The results of significant diagnostics from this hospitalization (including imaging, microbiology, ancillary and laboratory) are listed below for reference.     Microbiology: Recent Results (from the past 240 hour(s))  Culture, blood (Routine x 2)     Status: None (Preliminary result)   Collection Time: 08/12/23  5:48 PM   Specimen: BLOOD RIGHT FOREARM  Result Value Ref Range Status   Specimen Description BLOOD RIGHT FOREARM  Final   Special Requests   Final    BOTTLES DRAWN AEROBIC AND ANAEROBIC Blood Culture adequate volume   Culture   Final    NO GROWTH 2 DAYS Performed at [facility], [address] [place], [place] [address]    Report Status PENDING  Incomplete  Culture, blood (Routine x 2)     Status: None (Preliminary result)   Collection Time: 08/12/23  5:59 PM   Specimen: BLOOD RIGHT ARM  Result Value Ref Range Status   Specimen Description BLOOD RIGHT ARM  Final   Special Requests   Final    BOTTLES DRAWN AEROBIC AND ANAEROBIC Blood Culture results may not be optimal due to an inadequate volume of blood received in  culture bottles   Culture   Final    NO GROWTH 2 DAYS Performed at [facility], [address], [place], [place] [address]    Report Status PENDING  Incomplete  SARS Coronavirus 2 by RT PCR (hospital order, performed in [facility] hospital lab) *cepheid single result test* Anterior Nasal Swab     Status: None   Collection Time: 08/12/23 11:52 PM   Specimen: Anterior Nasal Swab  Result Value Ref Range Status   SARS Coronavirus 2 by RT PCR NEGATIVE NEGATIVE Final    Comment: Performed at [facility], [address] [place], [place] [address]  MRSA Next Gen by PCR, Nasal     Status: None   Collection Time: 08/13/23  8:46 PM   Specimen: Nasal Mucosa; Nasal Swab  Result Value Ref Range Status   MRSA by PCR Next Gen NOT DETECTED NOT DETECTED Final    Comment: (NOTE) The GeneXpert MRSA Assay (FDA approved for NASAL specimens only), is one component of a comprehensive MRSA colonization surveillance program. It is not intended to diagnose MRSA infection nor to guide or monitor treatment for MRSA infections. Test performance is not FDA approved in patients less than 66 years old. Performed at [facility], [address], [place], [place] [address]      Labs: BNP (last 3 results) Recent Labs    07/03/23 1125 07/16/23 2058 07/30/23 0838  BNP 45.7 43.9 90.1   Basic Metabolic Panel: Recent Labs  Lab 08/08/23 1520 08/12/23 1759 08/13/23 0235 08/13/23 0313 08/13/23 0407 08/14/23 0405  NA 136 133*  --  139 137 137  K 4.1 4.2  --  3.6 3.5 3.5  CL 96 98  --   --  102 101  CO2 35* 22  --   --  27 26  GLUCOSE 204* 163*  --   --  122* 90  BUN 11 10  --   --  8 8  CREATININE 0.74 0.85  --   --  0.77 0.68  CALCIUM 9.9 9.9  --   --  9.4 9.2  MG  --   --  1.8  --  1.9  --   PHOS  --   --  3.2  --  3.3  --    Liver Function Tests: Recent Labs  Lab 08/08/23 1520 08/12/23 1759 08/13/23 0407  AST 11 14* 12*  ALT 11 12 9   ALKPHOS 58 50 41  BILITOT 0.3 0.6 0.4   PROT 6.8 7.1 6.1*  ALBUMIN 3.7 3.1* 2.5*   No results for input(s): "LIPASE", "AMYLASE" in the last 168 hours. Recent Labs  Lab 08/13/23 0235  AMMONIA 22   CBC: Recent Labs  Lab 08/08/23 1520 08/12/23 1759 08/13/23 0235 08/13/23 0313 08/13/23 0407 08/14/23 0405  WBC 7.5 18.6* 13.4*  --  12.4* 6.7  NEUTROABS  --   --  10.4*  --   --  4.4  HGB 9.2* 9.4* 7.5* 8.2* 7.4* 7.5*  HCT 30.6* 31.6* 25.8* 24.0* 25.5* 26.0*  MCV 74.0* 77.1* 75.9*  --  76.3* 77.8*  PLT 428.0* 509* 477*  --  456* 435*   Cardiac Enzymes: Recent Labs  Lab 08/13/23 0235  CKTOTAL 143   BNP: Invalid input(s): "POCBNP" CBG: Recent Labs  Lab 08/13/23 1404 08/13/23 2100 08/13/23 2336 08/14/23 0344 08/14/23 0758  GLUCAP 116* 100* 93 95 97   D-Dimer No results for input(s): "DDIMER" in the last 72 hours. Hgb A1c Recent Labs    08/13/23 0235  HGBA1C 5.8*   Lipid Profile No results for input(s): "CHOL", "HDL", "LDLCALC", "TRIG", "CHOLHDL", "LDLDIRECT" in the last 72 hours. Thyroid function studies Recent Labs    08/13/23 0235  TSH 5.490*   Anemia work up Recent Labs    08/13/23 0235  VITAMINB12 705  FOLATE 16.2  FERRITIN 51  TIBC 283  IRON 8*  RETICCTPCT 1.9   Urinalysis    Component Value Date/Time   COLORURINE YELLOW 08/12/2023 2200   APPEARANCEUR CLEAR 08/12/2023 2200   APPEARANCEUR Clear 09/11/2021 1413   LABSPEC 1.009 08/12/2023 2200   LABSPEC 1.024 01/11/2015 1022   PHURINE 7.0 08/12/2023 2200   GLUCOSEU NEGATIVE 08/12/2023 2200   GLUCOSEU >=1000 (A) 10/19/2019 1034   HGBUR NEGATIVE 08/12/2023 2200   BILIRUBINUR NEGATIVE 08/12/2023 2200   BILIRUBINUR Negative 09/11/2021 1413   BILIRUBINUR Negative 01/11/2015 1022   KETONESUR NEGATIVE 08/12/2023 2200   PROTEINUR NEGATIVE 08/12/2023 2200   UROBILINOGEN 1.0 10/19/2019 1049   UROBILINOGEN 1.0 10/19/2019 1034   NITRITE NEGATIVE 08/12/2023 2200   LEUKOCYTESUR MODERATE (A) 08/12/2023 2200   LEUKOCYTESUR Negative  01/11/2015 1022   Sepsis Labs Recent Labs  Lab 08/12/23 1759 08/13/23 0235 08/13/23 0407 08/14/23 0405  WBC 18.6* 13.4* 12.4* 6.7   Microbiology Recent Results (from the past 240 hour(s))  Culture, blood (Routine x 2)     Status: None (Preliminary result)   Collection Time: 08/12/23  5:48 PM   Specimen: BLOOD RIGHT FOREARM  Result Value Ref Range Status   Specimen Description BLOOD RIGHT FOREARM  Final   Special Requests   Final    BOTTLES DRAWN AEROBIC AND ANAEROBIC Blood Culture adequate volume  Culture   Final    NO GROWTH 2 DAYS Performed at [facility], [address] [place], [place] [address]    Report Status PENDING  Incomplete  Culture, blood (Routine x 2)     Status: None (Preliminary result)   Collection Time: 08/12/23  5:59 PM   Specimen: BLOOD RIGHT ARM  Result Value Ref Range Status   Specimen Description BLOOD RIGHT ARM  Final   Special Requests   Final    BOTTLES DRAWN AEROBIC AND ANAEROBIC Blood Culture results may not be optimal due to an inadequate volume of blood received in culture bottles   Culture   Final    NO GROWTH 2 DAYS Performed at [facility], [address], [place], [place] [address]    Report Status PENDING  Incomplete  SARS Coronavirus 2 by RT PCR (hospital order, performed in [facility] hospital lab) *cepheid single result test* Anterior Nasal Swab     Status: None   Collection Time: 08/12/23 11:52 PM   Specimen: Anterior Nasal Swab  Result Value Ref Range Status   SARS Coronavirus 2 by RT PCR NEGATIVE NEGATIVE Final    Comment: Performed at [facility], [address] [place], [place] [address]  MRSA Next Gen by PCR, Nasal     Status: None   Collection Time: 08/13/23  8:46 PM   Specimen: Nasal Mucosa; Nasal Swab  Result Value Ref Range Status   MRSA by PCR Next Gen NOT DETECTED NOT DETECTED Final    Comment: (NOTE) The GeneXpert MRSA Assay (FDA approved for NASAL specimens only), is one component of a  comprehensive MRSA colonization surveillance program. It is not intended to diagnose MRSA infection nor to guide or monitor treatment for MRSA infections. Test performance is not FDA approved in patients less than 85 years old. Performed at [facility], [address], [place], [place] [address]     FURTHER DISCHARGE INSTRUCTIONS:   Get Medicines reviewed and adjusted: Please take all your medications with you for your next visit with your Primary MD   Laboratory/radiological data: Please request your Primary MD to go over all hospital tests and procedure/radiological results at the follow up, please ask your Primary MD to get all Hospital records sent to his/her office.   In some cases, they will be blood work, cultures and biopsy results pending at the time of your discharge. Please request that your primary care M.D. goes through all the records of your hospital data and follows up on these results.   Also Note the following: If you experience worsening of your admission symptoms, develop shortness of breath, life threatening emergency, suicidal or homicidal thoughts you must seek medical attention immediately by calling 911 or calling your MD immediately  if symptoms less severe.   You must read complete instructions/literature along with all the possible adverse reactions/side effects for all the Medicines you take and that have been prescribed to you. Take any new Medicines after you have completely understood and accpet all the possible adverse reactions/side effects.    Do not drive when taking Pain medications or sleeping medications (Benzodaizepines)   Do not take more than prescribed Pain, Sleep and Anxiety Medications. It is not advisable to combine anxiety,sleep and pain medications without talking with your primary care practitioner   Special Instructions: If you have smoked or chewed Tobacco  in the last 2 yrs please stop smoking, stop any regular Alcohol  and or  any  Recreational drug use.   Wear Seat belts while driving.   Please note: You were cared for by a hospitalist during your hospital stay. Once you are discharged, your primary care physician will handle any further medical issues. Please note that NO REFILLS for any discharge medications will be authorized once you are discharged, as it is imperative that you return to your primary care physician (or establish a relationship with a primary care physician if you do not have one) for your post hospital discharge needs so that they can reassess your need for medications and monitor your lab values  Time coordinating discharge: Over 30 minutes  SIGNED:   Hughie Closs, MD  [facility] 08/14/2023, 9:10 AM *Please note that this is a verbal dictation therefore any spelling or grammatical errors are due to the "Dragon Medical One" system interpretation. If 7PM-7AM, please contact night-coverage [email or web address]

## 2023-08-14 NOTE — Consult Note (Signed)
   [facility] Inpatient Consult   08/14/2023  CYNDIA SALA 05-Apr-1971 [id number]  [facility] [[facility]]  Accountable Care Organization [ACO] Patient: Valinda Hoar [facility] insurance  Primary Care Provider:  Eden Emms, NP with Corinda Gubler at [facility] is listed to provide the transition of care follow up calls and appointments  Patient screened for less than 30 days readmission with 5 hospitalizations in the past 6 months with noted extreme high risk score for unplanned readmission risk. Reviewed to assess for potential [facility]  [[facility]] Care Management service needs for post hospital transition for care coordination.  Review of patient's electronic medical record reveals patient is for home with palliative care verse hospice.  Met with the patient at the bedside with husband and they confirmed home with hospice.  Explained to patient regarding possible post hospital follow up call  Plan: Will update Care Coordination team if still active on case. Noted an appointment for 08/16/23 will notify.  If not, will sign off.  Of note, [facility] Care Management/Population Health does not replace or interfere with any arrangements made by the Inpatient Transition of Care team.  For questions contact:   Charlesetta Shanks, RN BSN CCM [facility]  [phone] business mobile phone Toll free office [phone]  Fax number: [phone] [email or web address]    .

## 2023-08-15 ENCOUNTER — Ambulatory Visit: Payer: Self-pay

## 2023-08-15 NOTE — Patient Outreach (Signed)
  Care Coordination   Follow Up Visit Note   08/15/2023 Name: Erika [name] MRN: [id number] DOB: [birth year]  Erika [name] is a 52 y.o. year old female who sees Cable, Genene Churn, NP for primary care. I  spoke with Frederica Kuster  What matters to the patients health and wellness today?  Spouse states patient is now in hospice.    Goals Addressed             This Visit's Progress    COMPLETED: post hospital progression / improvement and management of chronic health conditions.       Interventions Today    Flowsheet Row Most Recent Value  Chronic Disease   Chronic disease during today's visit Chronic Obstructive Pulmonary Disease (COPD), Congestive Heart Failure (CHF), Other  [hypotension, syncope and collapse]  General Interventions   General Interventions Discussed/Reviewed Doctor Visits  [message sent to primary care provider at the request of spouse that patient is being followed by hospice]              SDOH assessments and interventions completed:  No     Care Coordination Interventions:  Yes, provided   Follow up plan: No further intervention required.   Encounter Outcome:  Pt. Visit Completed   George Ina RN,BSN,CCM [facility] Care Coordination [phone] direct line

## 2023-08-15 NOTE — Telephone Encounter (Signed)
This encounter was created in error - please disregard.

## 2023-08-15 NOTE — Progress Notes (Signed)
Please let patient's family member know CT showed residual but improving pneumonia. I see she was in the hospital recently for 2 day and has been place on hospice care. She has a follow-up with [name] in October.

## 2023-08-17 LAB — CULTURE, BLOOD (ROUTINE X 2)
Culture: NO GROWTH
Culture: NO GROWTH
Special Requests: ADEQUATE

## 2023-08-25 DEATH — deceased

## 2023-08-27 DIAGNOSIS — M542 Cervicalgia: Secondary | ICD-10-CM | POA: Diagnosis not present

## 2023-08-27 DIAGNOSIS — G44009 Cluster headache syndrome, unspecified, not intractable: Secondary | ICD-10-CM | POA: Diagnosis not present

## 2023-08-27 DIAGNOSIS — G43909 Migraine, unspecified, not intractable, without status migrainosus: Secondary | ICD-10-CM | POA: Diagnosis not present

## 2023-09-05 ENCOUNTER — Ambulatory Visit: Payer: BC Managed Care – PPO | Admitting: Cardiology

## 2023-09-17 ENCOUNTER — Ambulatory Visit: Payer: BC Managed Care – PPO | Admitting: Cardiovascular Disease

## 2023-09-26 DIAGNOSIS — G43909 Migraine, unspecified, not intractable, without status migrainosus: Secondary | ICD-10-CM | POA: Diagnosis not present

## 2023-09-26 DIAGNOSIS — M542 Cervicalgia: Secondary | ICD-10-CM | POA: Diagnosis not present

## 2023-09-26 DIAGNOSIS — G44009 Cluster headache syndrome, unspecified, not intractable: Secondary | ICD-10-CM | POA: Diagnosis not present

## 2023-10-01 ENCOUNTER — Inpatient Hospital Stay: Payer: BC Managed Care – PPO | Admitting: Nurse Practitioner

## 2023-10-07 ENCOUNTER — Telehealth (HOSPITAL_COMMUNITY): Payer: BC Managed Care – PPO | Admitting: Psychiatry

## 2023-10-27 DIAGNOSIS — G44009 Cluster headache syndrome, unspecified, not intractable: Secondary | ICD-10-CM | POA: Diagnosis not present

## 2023-10-27 DIAGNOSIS — G43909 Migraine, unspecified, not intractable, without status migrainosus: Secondary | ICD-10-CM | POA: Diagnosis not present

## 2023-10-27 DIAGNOSIS — M542 Cervicalgia: Secondary | ICD-10-CM | POA: Diagnosis not present

## 2023-10-30 ENCOUNTER — Ambulatory Visit: Payer: BC Managed Care – PPO | Admitting: Nurse Practitioner

## 2023-11-26 DIAGNOSIS — G43909 Migraine, unspecified, not intractable, without status migrainosus: Secondary | ICD-10-CM | POA: Diagnosis not present

## 2023-11-26 DIAGNOSIS — G44009 Cluster headache syndrome, unspecified, not intractable: Secondary | ICD-10-CM | POA: Diagnosis not present

## 2023-11-26 DIAGNOSIS — M542 Cervicalgia: Secondary | ICD-10-CM | POA: Diagnosis not present

## 2024-03-05 ENCOUNTER — Ambulatory Visit: Payer: Medicare Other | Admitting: Family Medicine
